# Patient Record
Sex: Female | Born: 1955 | Race: Black or African American | Hispanic: No | Marital: Single | Smoking: Current every day smoker
Health system: Southern US, Community
[De-identification: ages and names within clinical notes are randomized; demographics above are authoritative.]

## PROBLEM LIST (undated history)

## (undated) DIAGNOSIS — F32A Depression, unspecified: Secondary | ICD-10-CM

## (undated) DIAGNOSIS — E119 Type 2 diabetes mellitus without complications: Secondary | ICD-10-CM

## (undated) DIAGNOSIS — K5901 Slow transit constipation: Secondary | ICD-10-CM

## (undated) DIAGNOSIS — T7840XA Allergy, unspecified, initial encounter: Secondary | ICD-10-CM

## (undated) DIAGNOSIS — E1142 Type 2 diabetes mellitus with diabetic polyneuropathy: Secondary | ICD-10-CM

## (undated) DIAGNOSIS — M009 Pyogenic arthritis, unspecified: Secondary | ICD-10-CM

## (undated) DIAGNOSIS — R011 Cardiac murmur, unspecified: Secondary | ICD-10-CM

## (undated) DIAGNOSIS — F329 Major depressive disorder, single episode, unspecified: Secondary | ICD-10-CM

## (undated) DIAGNOSIS — E46 Unspecified protein-calorie malnutrition: Secondary | ICD-10-CM

## (undated) DIAGNOSIS — F419 Anxiety disorder, unspecified: Secondary | ICD-10-CM

## (undated) DIAGNOSIS — K219 Gastro-esophageal reflux disease without esophagitis: Secondary | ICD-10-CM

## (undated) DIAGNOSIS — B1920 Unspecified viral hepatitis C without hepatic coma: Secondary | ICD-10-CM

## (undated) DIAGNOSIS — G629 Polyneuropathy, unspecified: Secondary | ICD-10-CM

## (undated) DIAGNOSIS — D649 Anemia, unspecified: Secondary | ICD-10-CM

## (undated) DIAGNOSIS — N186 End stage renal disease: Secondary | ICD-10-CM

## (undated) DIAGNOSIS — I499 Cardiac arrhythmia, unspecified: Secondary | ICD-10-CM

## (undated) DIAGNOSIS — I5032 Chronic diastolic (congestive) heart failure: Secondary | ICD-10-CM

## (undated) DIAGNOSIS — I10 Essential (primary) hypertension: Secondary | ICD-10-CM

## (undated) DIAGNOSIS — M1990 Unspecified osteoarthritis, unspecified site: Secondary | ICD-10-CM

## (undated) DIAGNOSIS — N184 Chronic kidney disease, stage 4 (severe): Secondary | ICD-10-CM

## (undated) DIAGNOSIS — R2681 Unsteadiness on feet: Secondary | ICD-10-CM

## (undated) DIAGNOSIS — E785 Hyperlipidemia, unspecified: Secondary | ICD-10-CM

## (undated) DIAGNOSIS — M1712 Unilateral primary osteoarthritis, left knee: Secondary | ICD-10-CM

## (undated) DIAGNOSIS — Z9289 Personal history of other medical treatment: Secondary | ICD-10-CM

## (undated) HISTORY — DX: Unspecified viral hepatitis C without hepatic coma: B19.20

## (undated) HISTORY — DX: Unilateral primary osteoarthritis, left knee: M17.12

## (undated) HISTORY — PX: SMALL INTESTINE SURGERY: SHX150

## (undated) HISTORY — PX: JOINT REPLACEMENT: SHX530

## (undated) HISTORY — DX: Unsteadiness on feet: R26.81

## (undated) HISTORY — PX: COLON SURGERY: SHX602

## (undated) HISTORY — DX: Slow transit constipation: K59.01

## (undated) HISTORY — PX: TUBAL LIGATION: SHX77

## (undated) HISTORY — DX: Essential (primary) hypertension: I10

## (undated) HISTORY — PX: CHOLECYSTECTOMY OPEN: SUR202

## (undated) HISTORY — PX: POLYPECTOMY: SHX149

## (undated) HISTORY — DX: Type 2 diabetes mellitus with diabetic polyneuropathy: E11.42

## (undated) HISTORY — DX: Hyperlipidemia, unspecified: E78.5

## (undated) HISTORY — DX: Allergy, unspecified, initial encounter: T78.40XA

## (undated) HISTORY — PX: APPENDECTOMY: SHX54

## (undated) HISTORY — DX: Unspecified protein-calorie malnutrition: E46

## (undated) HISTORY — DX: Chronic diastolic (congestive) heart failure: I50.32

## (undated) HISTORY — PX: COLONOSCOPY: SHX174

---

## 2011-10-18 ENCOUNTER — Inpatient Hospital Stay (HOSPITAL_COMMUNITY)
Admission: EM | Admit: 2011-10-18 | Discharge: 2011-10-19 | DRG: 313 | Disposition: A | Discharge status: Home / Self Care | Payer: Self-pay | Attending: Cardiovascular Disease | Admitting: Cardiovascular Disease

## 2011-10-18 ENCOUNTER — Emergency Department (HOSPITAL_COMMUNITY): Payer: Self-pay

## 2011-10-18 ENCOUNTER — Encounter (HOSPITAL_COMMUNITY): Payer: Self-pay | Admitting: Emergency Medicine

## 2011-10-18 DIAGNOSIS — R0602 Shortness of breath: Secondary | ICD-10-CM | POA: Diagnosis present

## 2011-10-18 DIAGNOSIS — R0789 Other chest pain: Principal | ICD-10-CM | POA: Diagnosis present

## 2011-10-18 DIAGNOSIS — Z23 Encounter for immunization: Secondary | ICD-10-CM

## 2011-10-18 DIAGNOSIS — R079 Chest pain, unspecified: Secondary | ICD-10-CM | POA: Diagnosis present

## 2011-10-18 DIAGNOSIS — Z79899 Other long term (current) drug therapy: Secondary | ICD-10-CM

## 2011-10-18 DIAGNOSIS — E119 Type 2 diabetes mellitus without complications: Secondary | ICD-10-CM | POA: Diagnosis present

## 2011-10-18 DIAGNOSIS — I10 Essential (primary) hypertension: Secondary | ICD-10-CM | POA: Diagnosis present

## 2011-10-18 DIAGNOSIS — F17200 Nicotine dependence, unspecified, uncomplicated: Secondary | ICD-10-CM | POA: Diagnosis present

## 2011-10-18 HISTORY — DX: Essential (primary) hypertension: I10

## 2011-10-18 LAB — URINALYSIS, ROUTINE W REFLEX MICROSCOPIC
Bilirubin Urine: NEGATIVE
Glucose, UA: 500 mg/dL — AB
Hgb urine dipstick: NEGATIVE
Leukocytes, UA: NEGATIVE
Nitrite: NEGATIVE
Protein, ur: >300 mg/dL — AB
Specific Gravity, Urine: 1.028 (ref 1.005–1.030)
Urobilinogen, UA: 1 mg/dL (ref 0.0–1.0)
pH: 6 (ref 5.0–8.0)

## 2011-10-18 LAB — COMPREHENSIVE METABOLIC PANEL
ALT: 74 U/L — ABNORMAL HIGH (ref 0–35)
AST: 27 U/L (ref 0–37)
Albumin: 3.7 g/dL (ref 3.5–5.2)
Alkaline Phosphatase: 110 U/L (ref 39–117)
BUN: 32 mg/dL — ABNORMAL HIGH (ref 6–23)
CO2: 28 mEq/L (ref 19–32)
Calcium: 9.7 mg/dL (ref 8.4–10.5)
Chloride: 93 mEq/L — ABNORMAL LOW (ref 96–112)
Creatinine, Ser: 1.57 mg/dL — ABNORMAL HIGH (ref 0.50–1.10)
GFR calc Af Amer: 41 mL/min — ABNORMAL LOW (ref >90)
GFR calc non Af Amer: 36 mL/min — ABNORMAL LOW (ref >90)
Glucose, Bld: 348 mg/dL — ABNORMAL HIGH (ref 70–99)
Potassium: 3.8 mEq/L (ref 3.5–5.1)
Sodium: 132 mEq/L — ABNORMAL LOW (ref 135–145)
Total Bilirubin: 0.3 mg/dL (ref 0.3–1.2)
Total Protein: 8.6 g/dL — ABNORMAL HIGH (ref 6.0–8.3)

## 2011-10-18 LAB — GLUCOSE, CAPILLARY: Glucose-Capillary: 387 mg/dL — ABNORMAL HIGH (ref 70–99)

## 2011-10-18 LAB — CBC
HCT: 44.4 % (ref 36.0–46.0)
Hemoglobin: 16.2 g/dL — ABNORMAL HIGH (ref 12.0–15.0)
MCH: 33.1 pg (ref 26.0–34.0)
MCHC: 36.5 g/dL — ABNORMAL HIGH (ref 30.0–36.0)
MCV: 90.8 fL (ref 78.0–100.0)
Platelets: 196 10*3/uL (ref 150–400)
RBC: 4.89 MIL/uL (ref 3.87–5.11)
RDW: 12.6 % (ref 11.5–15.5)
WBC: 6.4 10*3/uL (ref 4.0–10.5)

## 2011-10-18 LAB — DIFFERENTIAL
Basophils Absolute: 0 10*3/uL (ref 0.0–0.1)
Basophils Relative: 0 % (ref 0–1)
Eosinophils Absolute: 0 10*3/uL (ref 0.0–0.7)
Eosinophils Relative: 1 % (ref 0–5)
Lymphocytes Relative: 44 % (ref 12–46)
Lymphs Abs: 2.8 10*3/uL (ref 0.7–4.0)
Monocytes Absolute: 0.4 10*3/uL (ref 0.1–1.0)
Monocytes Relative: 7 % (ref 3–12)
Neutro Abs: 3.2 10*3/uL (ref 1.7–7.7)
Neutrophils Relative %: 49 % (ref 43–77)

## 2011-10-18 LAB — URINE MICROSCOPIC-ADD ON

## 2011-10-18 LAB — POCT I-STAT TROPONIN I: Troponin i, poc: 0.01 ng/mL (ref 0.00–0.08)

## 2011-10-18 MED ORDER — ATENOLOL 50 MG PO TABS
50.0000 mg | ORAL_TABLET | Freq: Every day | ORAL | Status: DC
  Administered 2011-10-19: 50 mg via ORAL
  Filled 2011-10-18: qty 1

## 2011-10-18 MED ORDER — SODIUM CHLORIDE 0.9 % IV SOLN: INTRAVENOUS | Status: DC

## 2011-10-18 MED ORDER — CLOPIDOGREL BISULFATE 75 MG PO TABS
75.0000 mg | ORAL_TABLET | Freq: Every day | ORAL | Status: DC
  Administered 2011-10-19: 75 mg via ORAL
  Filled 2011-10-18: qty 1

## 2011-10-18 MED ORDER — NITROGLYCERIN 0.4 MG SL SUBL: 0.4000 mg | SUBLINGUAL_TABLET | SUBLINGUAL | Status: DC | PRN

## 2011-10-18 MED ORDER — SIMVASTATIN 20 MG PO TABS: 20.0000 mg | ORAL_TABLET | Freq: Every day | ORAL | Status: DC

## 2011-10-18 MED ORDER — ASPIRIN 81 MG PO CHEW: 324.0000 mg | CHEWABLE_TABLET | ORAL | Status: DC

## 2011-10-18 MED ORDER — HEPARIN BOLUS VIA INFUSION
4000.0000 [IU] | Freq: Once | INTRAVENOUS | Status: AC
  Administered 2011-10-18: 4000 [IU] via INTRAVENOUS
  Filled 2011-10-18: qty 4000

## 2011-10-18 MED ORDER — SODIUM CHLORIDE 0.9 % IV BOLUS (SEPSIS)
1000.0000 mL | Freq: Once | INTRAVENOUS | Status: AC
  Administered 2011-10-18: 1000 mL via INTRAVENOUS

## 2011-10-18 MED ORDER — HYDROCHLOROTHIAZIDE 25 MG PO TABS
25.0000 mg | ORAL_TABLET | Freq: Every day | ORAL | Status: DC
Start: 2011-10-19 — End: 2011-10-19
  Administered 2011-10-19: 25 mg via ORAL
  Filled 2011-10-18: qty 1

## 2011-10-18 MED ORDER — SODIUM CHLORIDE 0.9 % IV SOLN
INTRAVENOUS | Status: DC
  Administered 2011-10-18: 23:00:00 via INTRAVENOUS

## 2011-10-18 MED ORDER — ACETAMINOPHEN 325 MG PO TABS: 650.0000 mg | ORAL_TABLET | ORAL | Status: DC | PRN

## 2011-10-18 MED ORDER — GLIPIZIDE 5 MG PO TABS
5.0000 mg | ORAL_TABLET | Freq: Two times a day (BID) | ORAL | Status: DC
  Administered 2011-10-19: 5 mg via ORAL
  Filled 2011-10-18 (×3): qty 1

## 2011-10-18 MED ORDER — ASPIRIN EC 81 MG PO TBEC: 81.0000 mg | DELAYED_RELEASE_TABLET | Freq: Every day | ORAL | Status: DC

## 2011-10-18 MED ORDER — SODIUM CHLORIDE 0.9 % IV SOLN: Freq: Once | INTRAVENOUS | Status: DC

## 2011-10-18 MED ORDER — IOHEXOL 300 MG/ML  SOLN
80.0000 mL | Freq: Once | INTRAMUSCULAR | Status: AC | PRN
  Administered 2011-10-18: 80 mL via INTRAVENOUS

## 2011-10-18 MED ORDER — INSULIN ASPART 100 UNIT/ML ~~LOC~~ SOLN: 0.0000 [IU] | Freq: Every day | SUBCUTANEOUS | Status: DC

## 2011-10-18 MED ORDER — ASPIRIN EC 81 MG PO TBEC
81.0000 mg | DELAYED_RELEASE_TABLET | Freq: Every day | ORAL | Status: DC
  Administered 2011-10-19: 81 mg via ORAL
  Filled 2011-10-18: qty 1

## 2011-10-18 MED ORDER — ONDANSETRON HCL 4 MG/2ML IJ SOLN: 4.0000 mg | Freq: Four times a day (QID) | INTRAMUSCULAR | Status: DC | PRN

## 2011-10-18 MED ORDER — ASPIRIN 300 MG RE SUPP
300.0000 mg | RECTAL | Status: AC
  Filled 2011-10-18: qty 1

## 2011-10-18 MED ORDER — IRBESARTAN 150 MG PO TABS
150.0000 mg | ORAL_TABLET | Freq: Every day | ORAL | Status: DC
  Administered 2011-10-19: 150 mg via ORAL
  Filled 2011-10-18: qty 1

## 2011-10-18 MED ORDER — DIPHENHYDRAMINE HCL 50 MG/ML IJ SOLN
25.0000 mg | Freq: Once | INTRAMUSCULAR | Status: AC
  Administered 2011-10-18: 25 mg via INTRAVENOUS
  Filled 2011-10-18: qty 1

## 2011-10-18 MED ORDER — ACETAMINOPHEN 325 MG PO TABS
650.0000 mg | ORAL_TABLET | ORAL | Status: DC | PRN
  Administered 2011-10-18: 650 mg via ORAL

## 2011-10-18 MED ORDER — SIMVASTATIN 20 MG PO TABS
20.0000 mg | ORAL_TABLET | Freq: Every day | ORAL | Status: DC
  Administered 2011-10-19: 20 mg via ORAL
  Filled 2011-10-18: qty 1

## 2011-10-18 MED ORDER — INSULIN ASPART 100 UNIT/ML ~~LOC~~ SOLN
0.0000 [IU] | Freq: Three times a day (TID) | SUBCUTANEOUS | Status: DC
  Administered 2011-10-19: 8 [IU] via SUBCUTANEOUS
  Administered 2011-10-19: 3 [IU] via SUBCUTANEOUS

## 2011-10-18 MED ORDER — HEPARIN (PORCINE) IN NACL 100-0.45 UNIT/ML-% IJ SOLN
950.0000 [IU]/h | INTRAMUSCULAR | Status: DC
  Administered 2011-10-18: 950 [IU]/h via INTRAVENOUS
  Filled 2011-10-18 (×2): qty 250

## 2011-10-18 MED ORDER — GABAPENTIN 300 MG PO CAPS
300.0000 mg | ORAL_CAPSULE | Freq: Three times a day (TID) | ORAL | Status: DC
  Administered 2011-10-18 – 2011-10-19 (×3): 300 mg via ORAL
  Filled 2011-10-18 (×4): qty 1

## 2011-10-18 MED ORDER — ASPIRIN 300 MG RE SUPP: 300.0000 mg | RECTAL | Status: DC

## 2011-10-18 MED ORDER — METOPROLOL TARTRATE 25 MG PO TABS: 50.0000 mg | ORAL_TABLET | Freq: Two times a day (BID) | ORAL | Status: DC

## 2011-10-18 MED ORDER — ASPIRIN 81 MG PO CHEW
324.0000 mg | CHEWABLE_TABLET | ORAL | Status: AC
  Administered 2011-10-18: 324 mg via ORAL
  Filled 2011-10-18: qty 4

## 2011-10-18 MED ORDER — ACETAMINOPHEN 325 MG PO TABS
ORAL_TABLET | ORAL | Status: AC
  Filled 2011-10-18: qty 2

## 2011-10-18 MED ORDER — CLOPIDOGREL BISULFATE 75 MG PO TABS
75.0000 mg | ORAL_TABLET | Freq: Every day | ORAL | Status: DC
  Filled 2011-10-18: qty 1

## 2011-10-18 NOTE — ED Provider Notes (Signed)
History     CSN: [id number]  Arrival date & time 10/18/11  75   First MD Initiated Contact with Patient 10/18/11 1626      Chief Complaint  Patient presents with  . Shortness of Breath    (Consider location/radiation/quality/duration/timing/severity/associated sxs/prior treatment) Patient is a 56 y.o. female presenting with shortness of breath. The history is provided by the patient.  Shortness of Breath  Associated symptoms include shortness of breath.   patient here with shortness of breath and pleuritic chest pain x2 days. Notes leg pain and swelling. Denies any diaphoresis with this. No fever or productive cough. Chest pain is sharp and last for seconds and radiates to her left arm, but she says this is been along standing problem for her. Seen by her doctor 3 days ago for similar symptoms but that diagnosis. No nausea vomiting. No medications taken prior to arrival  Past Medical History  Diagnosis Date  . Diabetes mellitus   . Hypertension     Past Surgical History  Procedure Date  . Cholecystectomy   . Appendectomy   . Small intestine surgery   . Tubal ligation     No family history on file.  History  Substance Use Topics  . Smoking status: Current Everyday Smoker -- 0.5 packs/day  . Smokeless tobacco: Not on file  . Alcohol Use: Yes     ocassionally    OB History    Grav Para Term Preterm Abortions TAB SAB Ect Mult Living                  Review of Systems  Respiratory: Positive for shortness of breath.   All other systems reviewed and are negative.    Allergies  Review of patient's allergies indicates no known allergies.  Home Medications   Current Outpatient Rx  Name Route Sig Dispense Refill  . DIPHENHYDRAMINE HCL 25 MG PO TABS Oral Take 50 mg by mouth every 6 (six) hours as needed.    [name] GABAPENTIN 300 MG PO CAPS Oral Take 300 mg by mouth at bedtime.    [name] GLIPIZIDE 5 MG PO TABS Oral Take 5 mg by mouth 2 (two) times daily before a meal.    .  ADULT MULTIVITAMIN W/MINERALS CH Oral Take 1 tablet by mouth daily.    [name] NAPROXEN SODIUM 220 MG PO TABS Oral Take 440 mg by mouth 2 (two) times daily with a meal.    . OLMESARTAN MEDOXOMIL-HCTZ 40-25 MG PO TABS Oral Take 1 tablet by mouth daily.    . ATENOLOL 50 MG PO TABS Oral Take 50 mg by mouth 2 (two) times daily.      BP 152/89  Pulse 119  Temp(Src) 98.9 F (37.2 C) (Oral)  Resp 25  SpO2 100%  Physical Exam  Nursing note and vitals reviewed. Constitutional: She is oriented to person, place, and time. She appears well-developed and well-nourished.  Non-toxic appearance. No distress.  HENT:  Head: Normocephalic and atraumatic.  Eyes: Conjunctivae, EOM and lids are normal. Pupils are equal, round, and reactive to light.  Neck: Normal range of motion. Neck supple. No tracheal deviation present. No mass present.  Cardiovascular: Regular rhythm and normal heart sounds.  Tachycardia present.  Exam reveals no gallop.   No murmur heard. Pulmonary/Chest: Effort normal and breath sounds normal. No stridor. No respiratory distress. She has no decreased breath sounds. She has no wheezes. She has no rhonchi. She has no rales.  Abdominal: Soft. Normal appearance and bowel  sounds are normal. She exhibits no distension. There is no tenderness. There is no rebound and no CVA tenderness.  Musculoskeletal: Normal range of motion. She exhibits no edema and no tenderness.  Neurological: She is alert and oriented to person, place, and time. She has normal strength. No cranial nerve deficit or sensory deficit. GCS eye subscore is 4. GCS verbal subscore is 5. GCS motor subscore is 6.  Skin: Skin is warm and dry. No abrasion and no rash noted.  Psychiatric: She has a normal mood and affect. Her speech is normal and behavior is normal.    ED Course  Procedures (including critical care time)  Labs Reviewed  GLUCOSE, CAPILLARY - Abnormal; Notable for the following:    Glucose-Capillary 387 (*)    All  other components within normal limits  CBC  DIFFERENTIAL  COMPREHENSIVE METABOLIC PANEL  URINALYSIS, ROUTINE W REFLEX MICROSCOPIC  URINE CULTURE   No results found.   No diagnosis found.    MDM   Date: 10/18/2011  Rate: 115  Rhythm: sinus tachycardia  QRS Axis: normal  Intervals: normal  ST/T Wave abnormalities: nonspecific ST changes  Conduction Disutrbances:none  Narrative Interpretation:   Old EKG Reviewed: unchanged  While patient was then monitored here in the department she did have a 6-8 beat run of ventricular tachycardia. She had aspirin prior to arrival. Given her history of multiple cardiac risk factors have spoken with Dr. Terrence Dupont and he will come to admit          Leota Jacobsen, MD 10/18/11 [phone]

## 2011-10-18 NOTE — H&P (Signed)
Brandy Houston is an 56 y.o. female.   Chief Complaint: Chest pain HPI: 57 years old black female with DM, II and hypertension has substernal sharp chest pain radiating to left arm x 2 days. She also has shortness of breath.  Past Medical History  Diagnosis Date  . Diabetes mellitus   . Hypertension       Past Surgical History  Procedure Date  . Cholecystectomy   . Appendectomy   . Small intestine surgery   . Tubal ligation     No family history on file. Social History:  reports that she has been smoking.  She does not have any smokeless tobacco history on file. She reports that she drinks alcohol. She reports that she does not use illicit drugs.  Allergies:  Allergies  Allergen Reactions  . Omnipaque (Iohexol) Hives     (Not in a hospital admission)  Results for orders placed during the hospital encounter of 10/18/11 (from the past 48 hour(s))  GLUCOSE, CAPILLARY     Status: Abnormal   Collection Time   10/18/11  3:55 PM      Component Value Range Comment   Glucose-Capillary 387 (*) 70 - 99 (mg/dL)   URINALYSIS, ROUTINE W REFLEX MICROSCOPIC     Status: Abnormal   Collection Time   10/18/11  4:50 PM      Component Value Range Comment   Color, Urine AMBER (*) YELLOW  BIOCHEMICALS MAY BE AFFECTED BY COLOR   APPearance CLOUDY (*) CLEAR     Specific Gravity, Urine 1.028  1.005 - 1.030     pH 6.0  5.0 - 8.0     Glucose, UA 500 (*) NEGATIVE (mg/dL)    Hgb urine dipstick NEGATIVE  NEGATIVE     Bilirubin Urine NEGATIVE  NEGATIVE     Ketones, ur TRACE (*) NEGATIVE (mg/dL)    Protein, ur >300 (*) NEGATIVE (mg/dL)    Urobilinogen, UA 1.0  0.0 - 1.0 (mg/dL)    Nitrite NEGATIVE  NEGATIVE     Leukocytes, UA NEGATIVE  NEGATIVE    URINE MICROSCOPIC-ADD ON     Status: Abnormal   Collection Time   10/18/11  4:50 PM      Component Value Range Comment   Squamous Epithelial / LPF FEW (*) RARE     WBC, UA 0-2  <3 (WBC/hpf)    RBC / HPF 0-2  <3 (RBC/hpf)    Bacteria, UA RARE  RARE     Casts HYALINE CASTS (*) NEGATIVE  GRANULAR CAST  CBC     Status: Abnormal   Collection Time   10/18/11  5:00 PM      Component Value Range Comment   WBC 6.4  4.0 - 10.5 (K/uL)    RBC 4.89  3.87 - 5.11 (MIL/uL)    Hemoglobin 16.2 (*) 12.0 - 15.0 (g/dL)    HCT 44.4  36.0 - 46.0 (%)    MCV 90.8  78.0 - 100.0 (fL)    MCH 33.1  26.0 - 34.0 (pg)    MCHC 36.5 (*) 30.0 - 36.0 (g/dL)    RDW 12.6  11.5 - 15.5 (%)    Platelets 196  150 - 400 (K/uL)   DIFFERENTIAL     Status: Normal   Collection Time   10/18/11  5:00 PM      Component Value Range Comment   Neutrophils Relative 49  43 - 77 (%)    Neutro Abs 3.2  1.7 - 7.7 (K/uL)  Lymphocytes Relative 44  12 - 46 (%)    Lymphs Abs 2.8  0.7 - 4.0 (K/uL)    Monocytes Relative 7  3 - 12 (%)    Monocytes Absolute 0.4  0.1 - 1.0 (K/uL)    Eosinophils Relative 1  0 - 5 (%)    Eosinophils Absolute 0.0  0.0 - 0.7 (K/uL)    Basophils Relative 0  0 - 1 (%)    Basophils Absolute 0.0  0.0 - 0.1 (K/uL)   COMPREHENSIVE METABOLIC PANEL     Status: Abnormal   Collection Time   10/18/11  5:00 PM      Component Value Range Comment   Sodium 132 (*) 135 - 145 (mEq/L)    Potassium 3.8  3.5 - 5.1 (mEq/L)    Chloride 93 (*) 96 - 112 (mEq/L)    CO2 28  19 - 32 (mEq/L)    Glucose, Bld 348 (*) 70 - 99 (mg/dL)    BUN 32 (*) 6 - 23 (mg/dL)    Creatinine, Ser 1.57 (*) 0.50 - 1.10 (mg/dL)    Calcium 9.7  8.4 - 10.5 (mg/dL)    Total Protein 8.6 (*) 6.0 - 8.3 (g/dL)    Albumin 3.7  3.5 - 5.2 (g/dL)    AST 27  0 - 37 (U/L)    ALT 74 (*) 0 - 35 (U/L)    Alkaline Phosphatase 110  39 - 117 (U/L)    Total Bilirubin 0.3  0.3 - 1.2 (mg/dL)    GFR calc non Af Amer 36 (*) >90 (mL/min)    GFR calc Af Amer 41 (*) >90 (mL/min)   POCT I-STAT TROPONIN I     Status: Normal   Collection Time   10/18/11  5:11 PM      Component Value Range Comment   Troponin i, poc 0.01  0.00 - 0.08 (ng/mL)    Comment 3             Ct Angio Chest W/cm &/or Wo Cm  10/18/2011  *RADIOLOGY REPORT*   Clinical Data: Shortness of breath.  Left sided pleuritic chest pain.  CT ANGIOGRAPHY CHEST 10/18/2011::  Technique:  Multidetector CT imaging of the chest using the standard protocol during bolus administration of intravenous contrast. Multiplanar reconstructed images including MIPs were obtained and reviewed to evaluate the vascular anatomy.  Contrast: 81mL OMNIPAQUE IOHEXOL 300 MG/ML.  Comparison: None.  Findings: Contrast opacification of the pulmonary arteries is very good.  Respiratory motion blurs images of the lung bases; overall, the study is of good diagnostic quality.  No filling defects within either main pulmonary artery or their branches in either lung to suggest pulmonary embolism.  Heart size upper normal with mild left ventricular hypertrophy.  Moderate left circumflex coronary artery calcification and mild LAD coronary calcification.  No pericardial effusion.  Mild to moderate atherosclerosis involving the thoracic aorta without aneurysm or dissection.  Pulmonary parenchyma clear without localized airspace consolidation, interstitial disease, or parenchymal nodules or masses.  Central airways patent without significant bronchial wall thickening.  No significant mediastinal, hilar, or axillary lymphadenopathy. Visualized thyroid gland unremarkable.  Visualized extreme upper abdomen unremarkable.  Bone window images demonstrate mid and lower thoracic spondylosis and exaggeration of the usual thoracic kyphosis.  IMPRESSION:  1.  No evidence of pulmonary embolism. 2.  No acute cardiopulmonary disease. 3.  Moderate left circumflex coronary calcification and mild LAD coronary calcification.  Original Report Authenticated By: Deniece Portela, M.D.    @ROS @  Blood pressure 152/89, pulse 119, temperature 98.9 F (37.2 C), temperature source Oral, resp. rate 25, SpO2 100.00%.  Constitutional: She is oriented to person, place, and time. She appears well-developed and well-nourished. No distress.    HENT: Head: Normocephalic and atraumatic. Eyes: Brown, Conjunctivae, EOM and lids are normal. Pupils are equal, round, and reactive to light.  Neck: Normal range of motion. Neck supple. No tracheal deviation present. No mass present.  Cardiovascular: Regular rhythm and normal heart sounds. Tachycardia present. Exam reveals no gallop.  No murmur heard.  Pulmonary/Chest: Effort normal and breath sounds normal. No stridor. No respiratory distress. She has no decreased breath sounds. She has no wheezes. She has no rhonchi. She has no rales.  Abdominal: Soft. Normal appearance and bowel sounds are normal. She exhibits no distension. There is no tenderness. There is no rebound and no CVA tenderness.  Musculoskeletal: Normal range of motion. She exhibits no edema and no tenderness.  Neurological: She is alert and oriented to person, place, and time. She has normal strength. No cranial nerve deficit or sensory deficit.   Skin: Skin is warm and dry. No abrasion and no rash noted.  Psychiatric: She has a normal mood and affect. Her speech is normal and behavior is normal.   Assessment/Plan Chest pain  R/O MI DM, II Hypertension Dehydration Renal insufficiency  Admit/Nuclear stress test/IV fluids/Home meds  Rollo Farquhar S 10/18/2011, 8:09 PM

## 2011-10-18 NOTE — Progress Notes (Signed)
[place] Initial Consult Note  Pharmacy Consult for: Heparin  Indication: chest pain/ACS    Patient Data:   Allergies: Allergies  Allergen Reactions  . Omnipaque (Iohexol) Hives    Patient Measurements: Height: 5\' 7"  (170.2 cm) Weight: 173 lb 15.1 oz (78.9 kg) IBW/kg (Calculated) : 61.6   Vital Signs: Temp:  [98.2 F (36.8 C)-98.9 F (37.2 C)] 98.2 F (36.8 C) (05/04 2246) Pulse Rate:  [96-119] 96  (05/04 2246) Resp:  [18-25] 18  (05/04 2246) BP: (152-163)/(89-98) 158/98 mmHg (05/04 2246) SpO2:  [100 %] 100 % (05/04 2246) Weight:  [173 lb 15.1 oz (78.9 kg)] 173 lb 15.1 oz (78.9 kg) (05/04 2246)  Intake/Output from previous day: No intake or output data in the 24 hours ending 10/18/11 2249  Labs:  Basename 10/18/11 1700  HGB 16.2*  HCT 44.4  PLT 196  APTT --  LABPROT --  INR --  HEPARINUNFRC --  CREATININE 1.57*  CKTOTAL --  CKMB --  TROPONINI --   Estimated Creatinine Clearance: 42.8 ml/min (by C-G formula based on Cr of 1.57).  Medical History: Past Medical History  Diagnosis Date  . Diabetes mellitus   . Hypertension     Scheduled medications:     . aspirin  324 mg Oral NOW   Or  . aspirin  300 mg Rectal NOW  . aspirin EC  81 mg Oral Daily  . atenolol  50 mg Oral Daily  . clopidogrel  75 mg Oral Q breakfast  . diphenhydrAMINE  25 mg Intravenous Once  . gabapentin  300 mg Oral TID  . glipiZIDE  5 mg Oral BID AC  . hydrochlorothiazide  25 mg Oral Daily  . insulin aspart  0-15 Units Subcutaneous TID WC  . insulin aspart  0-5 Units Subcutaneous QHS  . irbesartan  150 mg Oral Daily  . simvastatin  20 mg Oral q1800  . sodium chloride  1,000 mL Intravenous Once  . sodium chloride  1,000 mL Intravenous Once  . DISCONTD: sodium chloride   Intravenous Once  . DISCONTD: aspirin  324 mg Oral NOW  . DISCONTD: aspirin EC  81 mg Oral Daily  . DISCONTD: aspirin  300 mg Rectal NOW  . DISCONTD: clopidogrel  75 mg Oral Q  breakfast  . DISCONTD: metoprolol tartrate  50 mg Oral BID  . DISCONTD: simvastatin  20 mg Oral q1800     Assessment:  56 y.o. female admitted on 10/18/2011, with r/o ACS. Pharmacy consulted to manage IV heparin. Baseline Hgb 16.2, plt 196. No baseline INR, but no anticoagulant on med history.   Goal of Therapy:  1. Heparin level 0.3-0.7 units/ml  Plan:  1. Heparin 4000 units IV x 1, then 950 units/hr.  2. Daily CBC, heparin level.  3. Baseline PT / INR.  Monia Sabal Doy Mince, PharmD 10/18/2011, 10:49 PM

## 2011-10-18 NOTE — ED Notes (Signed)
Pt presenting to ed with c/o shortness of breath and pt states she had chest pain last night but denies chest pain at this time. Pt denies nausea and vomiting at this time. Pt states she does have pain in both sides of her jaw with a headache. Pt states she was recently seen by pcp on Thursday. Pt is alert and oriented at this time

## 2011-10-19 ENCOUNTER — Inpatient Hospital Stay (HOSPITAL_COMMUNITY): Payer: Self-pay

## 2011-10-19 ENCOUNTER — Encounter (HOSPITAL_COMMUNITY): Payer: Self-pay | Admitting: *Deleted

## 2011-10-19 LAB — BASIC METABOLIC PANEL
BUN: 23 mg/dL (ref 6–23)
CO2: 26 mEq/L (ref 19–32)
Calcium: 9.2 mg/dL (ref 8.4–10.5)
Chloride: 97 mEq/L (ref 96–112)
Creatinine, Ser: 0.87 mg/dL (ref 0.50–1.10)
GFR calc Af Amer: 84 mL/min — ABNORMAL LOW (ref >90)
GFR calc non Af Amer: 73 mL/min — ABNORMAL LOW (ref >90)
Glucose, Bld: 201 mg/dL — ABNORMAL HIGH (ref 70–99)
Potassium: 3.4 mEq/L — ABNORMAL LOW (ref 3.5–5.1)
Sodium: 134 mEq/L — ABNORMAL LOW (ref 135–145)

## 2011-10-19 LAB — CARDIAC PANEL(CRET KIN+CKTOT+MB+TROPI)
CK, MB: 3.5 ng/mL (ref 0.3–4.0)
Relative Index: 2.4 (ref 0.0–2.5)
Total CK: 147 U/L (ref 7–177)
Troponin I: <0.3 ng/mL (ref <0.30)

## 2011-10-19 LAB — GLUCOSE, CAPILLARY
Glucose-Capillary: 230 mg/dL — ABNORMAL HIGH (ref 70–99)
Glucose-Capillary: 298 mg/dL — ABNORMAL HIGH (ref 70–99)
Glucose-Capillary: 181 mg/dL — ABNORMAL HIGH (ref 70–99)

## 2011-10-19 LAB — HEPARIN LEVEL (UNFRACTIONATED): Heparin Unfractionated: 0.46 IU/mL (ref 0.30–0.70)

## 2011-10-19 LAB — PROTIME-INR
INR: 0.93 (ref 0.00–1.49)
Prothrombin Time: 12.7 seconds (ref 11.6–15.2)

## 2011-10-19 LAB — URINE CULTURE
Colony Count: 85000
Culture  Setup Time: 201305042016

## 2011-10-19 LAB — LIPID PANEL
Cholesterol: 153 mg/dL (ref 0–200)
HDL: 69 mg/dL (ref >39)
LDL Cholesterol: 64 mg/dL (ref 0–99)
Total CHOL/HDL Ratio: 2.2 RATIO
Triglycerides: 99 mg/dL (ref <150)
VLDL: 20 mg/dL (ref 0–40)

## 2011-10-19 MED ORDER — TECHNETIUM TC 99M TETROFOSMIN IV KIT
10.0000 | PACK | Freq: Once | INTRAVENOUS | Status: AC | PRN
  Administered 2011-10-19: 10 via INTRAVENOUS

## 2011-10-19 MED ORDER — POTASSIUM CHLORIDE ER 10 MEQ PO TBCR: 10.0000 meq | EXTENDED_RELEASE_TABLET | Freq: Every morning | ORAL | Status: DC

## 2011-10-19 MED ORDER — SIMVASTATIN 20 MG PO TABS: 20.0000 mg | ORAL_TABLET | Freq: Every day | ORAL | Status: DC

## 2011-10-19 MED ORDER — TECHNETIUM TC 99M TETROFOSMIN IV KIT
30.0000 | PACK | Freq: Once | INTRAVENOUS | Status: AC | PRN
  Administered 2011-10-19: 30 via INTRAVENOUS

## 2011-10-19 MED ORDER — REGADENOSON 0.4 MG/5ML IV SOLN
0.4000 mg | Freq: Once | INTRAVENOUS | Status: AC
  Administered 2011-10-19: 0.4 mg via INTRAVENOUS
  Filled 2011-10-19: qty 5

## 2011-10-19 NOTE — Progress Notes (Signed)
[place] for: Heparin  Indication: chest pain/ACS    Patient Data:   Allergies: Allergies  Allergen Reactions  . Omnipaque (Iohexol) Hives    Patient Measurements: Height: 5\' 7"  (170.2 cm) Weight: 172 lb 6.4 oz (78.2 kg) IBW/kg (Calculated) : 61.6   Labs:  Basename 10/19/11 0430 10/18/11 1700  HGB -- 16.2*  HCT -- 44.4  PLT -- 196  APTT -- --  LABPROT 12.7 --  INR 0.93 --  HEPARINUNFRC 0.46 --  CREATININE -- 1.57*  CKTOTAL 147 --  CKMB 3.5 --  TROPONINI <0.30 --   Estimated Creatinine Clearance: 42.6 ml/min (by C-G formula based on Cr of 1.57).   Assessment:  56 y.o. female  with ACS for Heparin  Goal of Therapy:  1. Heparin level 0.3-0.7 units/ml  Plan:  Continue Heparin at current rate Recheck level with next cardiac panel to verify.  Phillis Knack, PharmD, BCPS

## 2011-10-19 NOTE — Discharge Summary (Signed)
Physician Discharge Summary  [name] MRN: [id number] DOB/AGE: [birth year] 56 y.o.  Admit date: 10/18/2011 Discharge date: 10/19/2011  Admission Diagnoses: Chest pain Shortness of breath DM, II Hypertension  Discharge Diagnoses:  Principal Problem:  *Chest pain at rest Active Problems:  Shortness of breath DM, II Hypertension  Discharged Condition: good  Hospital Course: 56 years old black female had recurrent chest pain x 2 days. Her cardiac enzymes were normal. She underwent nuclear stress test that showed mild global hypokinesia with EF-50 %. With no reversible ischemia she was discharged home in stable condition. Here renal function inproved with IV hydration and K+ supplement was given for K+ level of 3.4 meq.  Consults: cardiology  Significant Diagnostic Studies: labs: Near normal CBC, electrolytes. Creatinine normalized post hydration. and nuclear medicine: EF 50 % and no reversible ischemia   Treatments: IV hydration and cardiac meds: atenolol  Discharge Exam: Blood pressure 135/71, pulse 86, temperature 98.7 F (37.1 C), temperature source Oral, resp. rate 22, height 5\' 7"  (1.702 m), weight 78.2 kg (172 lb 6.4 oz), last menstrual period 03/16/2002, SpO2 100.00%. Constitutional: She is oriented to person, place, and time. She appears well-developed and well-nourished. No distress.  HENT: Head: Normocephalic and atraumatic. Eyes: Brown, Conjunctivae, EOM and lids are normal. Pupils are equal, round, and reactive to light.  Neck: Normal range of motion. Neck supple. No tracheal deviation present. No mass present.  Cardiovascular: Regular rhythm and normal heart sounds. Tachycardia present. Exam reveals no gallop.  No murmur heard.  Pulmonary/Chest: Effort normal and breath sounds normal. No stridor. No respiratory distress. She has no decreased breath sounds. She has no wheezes. She has no rhonchi. She has no rales.  Abdominal: Soft. Normal appearance and bowel  sounds are normal. She exhibits no distension. There is no tenderness. There is no rebound and no CVA tenderness.  Musculoskeletal: Normal range of motion. She exhibits no edema and no tenderness.  Neurological: She is alert and oriented to person, place, and time. She has normal strength. No cranial nerve deficit or sensory deficit.  Skin: Skin is warm and dry. No abrasion and no rash noted.  Psychiatric: She has a normal mood and affect. Her speech is normal and behavior is normal   Disposition: Final discharge disposition not confirmed   Medication List  As of 10/19/2011  6:37 PM   STOP taking these medications         naproxen sodium 220 MG tablet         TAKE these medications         atenolol 50 MG tablet   Commonly known as: TENORMIN   Take 50 mg by mouth 2 (two) times daily.      diphenhydrAMINE 25 MG tablet   Commonly known as: BENADRYL   Take 50 mg by mouth every 6 (six) hours as needed.      gabapentin 300 MG capsule   Commonly known as: NEURONTIN   Take 300 mg by mouth at bedtime.      glipiZIDE 5 MG tablet   Commonly known as: GLUCOTROL   Take 5 mg by mouth 2 (two) times daily before a meal.      mulitivitamin with minerals Tabs   Take 1 tablet by mouth daily.      olmesartan-hydrochlorothiazide 40-25 MG per tablet   Commonly known as: BENICAR HCT   Take 1 tablet by mouth daily.      potassium chloride 10 MEQ tablet   Commonly known  as: K-DUR   Take 1 tablet (10 mEq total) by mouth every morning.      simvastatin 20 MG tablet   Commonly known as: ZOCOR   Take 1 tablet (20 mg total) by mouth daily at 6 PM.           Follow-up Information    Follow up with Brandy Demark, MD. Schedule an appointment as soon as possible for a visit in 2 weeks.   Contact information:   [address] [place] [phone]          Signed: Birdie Houston 10/19/2011, 6:37 PM

## 2011-10-20 LAB — URINE CULTURE
Colony Count: 50000
Culture  Setup Time: 201305051052

## 2011-10-20 LAB — GLUCOSE, CAPILLARY: Glucose-Capillary: 297 mg/dL — ABNORMAL HIGH (ref 70–99)

## 2011-10-22 NOTE — Progress Notes (Signed)
Retro-Utilization review completed  

## 2012-11-30 ENCOUNTER — Encounter (HOSPITAL_COMMUNITY): Payer: Self-pay | Admitting: Emergency Medicine

## 2012-11-30 ENCOUNTER — Emergency Department (HOSPITAL_COMMUNITY)
Admission: EM | Admit: 2012-11-30 | Discharge: 2012-11-30 | Disposition: A | Discharge status: Home / Self Care | Payer: Self-pay | Attending: Emergency Medicine | Admitting: Emergency Medicine

## 2012-11-30 DIAGNOSIS — E119 Type 2 diabetes mellitus without complications: Secondary | ICD-10-CM | POA: Insufficient documentation

## 2012-11-30 DIAGNOSIS — I10 Essential (primary) hypertension: Secondary | ICD-10-CM | POA: Insufficient documentation

## 2012-11-30 DIAGNOSIS — Z7982 Long term (current) use of aspirin: Secondary | ICD-10-CM | POA: Insufficient documentation

## 2012-11-30 DIAGNOSIS — Z79899 Other long term (current) drug therapy: Secondary | ICD-10-CM | POA: Insufficient documentation

## 2012-11-30 DIAGNOSIS — F17200 Nicotine dependence, unspecified, uncomplicated: Secondary | ICD-10-CM | POA: Insufficient documentation

## 2012-11-30 DIAGNOSIS — R040 Epistaxis: Secondary | ICD-10-CM | POA: Insufficient documentation

## 2012-11-30 LAB — POCT I-STAT, CHEM 8
BUN: 18 mg/dL (ref 6–23)
Calcium, Ion: 1.07 mmol/L — ABNORMAL LOW (ref 1.12–1.23)
Chloride: 103 mEq/L (ref 96–112)
Creatinine, Ser: 1.2 mg/dL — ABNORMAL HIGH (ref 0.50–1.10)
Glucose, Bld: 270 mg/dL — ABNORMAL HIGH (ref 70–99)
HCT: 40 % (ref 36.0–46.0)
Hemoglobin: 13.6 g/dL (ref 12.0–15.0)
Potassium: 3.9 mEq/L (ref 3.5–5.1)
Sodium: 139 mEq/L (ref 135–145)
TCO2: 24 mmol/L (ref 0–100)

## 2012-11-30 LAB — GLUCOSE, CAPILLARY: Glucose-Capillary: 299 mg/dL — ABNORMAL HIGH (ref 70–99)

## 2012-11-30 MED ORDER — GLIPIZIDE 5 MG PO TABS: 5.0000 mg | ORAL_TABLET | Freq: Two times a day (BID) | ORAL | Status: DC

## 2012-11-30 MED ORDER — ATENOLOL 50 MG PO TABS: 50.0000 mg | ORAL_TABLET | Freq: Two times a day (BID) | ORAL | Status: DC

## 2012-11-30 MED ORDER — OLMESARTAN MEDOXOMIL-HCTZ 40-25 MG PO TABS: 1.0000 | ORAL_TABLET | Freq: Every day | ORAL | Status: DC

## 2012-11-30 MED ORDER — GABAPENTIN 300 MG PO CAPS: 300.0000 mg | ORAL_CAPSULE | Freq: Every day | ORAL | Status: DC

## 2012-11-30 NOTE — ED Provider Notes (Signed)
History     CSN: [id number]  Arrival date & time 11/30/12  36   First MD Initiated Contact with Patient 11/30/12 1608      Chief Complaint  Patient presents with  . Epistaxis    (Consider location/radiation/quality/duration/timing/severity/associated sxs/prior treatment) HPI  Brandy Houston is 57 y/o F w/ pmh of htn and DM who presents with CC of Epistaxis. Patient has been off of all medications for the Past 3 months due to insurance problems.  Patient states she's had 3 episodes of nosebleeds since last night.  Resolved with pressure to the nose.  Patient has never had nose bleeds previous to this time.  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  She does not take any blood thinners. Denies fevers, chills, myalgias, arthralgias. Denies DOE, SOB, chest tightness or pressure, radiation to left arm, jaw or back, or diaphoresis. Denies dysuria, flank pain, suprapubic pain, frequency, urgency, or hematuria. Denies headaches, light headedness, weakness, visual disturbances. Denies abdominal pain, nausea, vomiting, diarrhea or constipation.     Past Medical History  Diagnosis Date  . Diabetes mellitus   . Hypertension     Past Surgical History  Procedure Laterality Date  . Cholecystectomy    . Appendectomy    . Small intestine surgery    . Tubal ligation      History reviewed. No pertinent family history.  History  Substance Use Topics  . Smoking status: Current Every Day Smoker -- 0.50 packs/day  . Smokeless tobacco: Never Used  . Alcohol Use: Yes     Comment: ocassionally    OB History   Grav Para Term Preterm Abortions TAB SAB Ect Mult Living                  Review of Systems Ten systems reviewed and are negative for acute change, except as noted in the HPI.   Allergies  Compazine and Omnipaque  Home Medications   Current Outpatient Rx  Name  Route  Sig  Dispense  Refill  . aspirin EC 81 MG tablet   Oral   Take 81 mg  by mouth daily as needed for pain.         . Ibuprofen-Diphenhydramine HCl 200-25 MG CAPS   Oral   Take 2 tablets by mouth every 8 (eight) hours as needed (for pain).         [name] atenolol (TENORMIN) 50 MG tablet   Oral   Take 50 mg by mouth 2 (two) times daily.         [name] gabapentin (NEURONTIN) 300 MG capsule   Oral   Take 300 mg by mouth at bedtime.         [name] glipiZIDE (GLUCOTROL) 5 MG tablet   Oral   Take 5 mg by mouth 2 (two) times daily before a meal.         . olmesartan-hydrochlorothiazide (BENICAR HCT) 40-25 MG per tablet   Oral   Take 1 tablet by mouth daily.           BP 171/105  Pulse 94  Temp(Src) 98 F (36.7 C) (Oral)  Resp 18  SpO2 100%  LMP 03/16/2002  Physical Exam Physical Exam  Nursing note and vitals reviewed. Constitutional: She is oriented to person, place, and time. She appears well-developed and well-nourished. No distress.  HENT:  Head: Normocephalic and atraumatic.  Eyes: Conjunctivae normal and EOM are normal. Pupils are equal, round, and reactive to  light. No scleral icterus.  Nose: dilated vessels in the R Kiesselbach's plexus.  No clots or active bleeding. Neck: Normal range of motion.  Cardiovascular: Normal rate, regular rhythm and normal heart sounds.  Exam reveals no gallop and no friction rub.   No murmur heard. Pulmonary/Chest: Effort normal and breath sounds normal. No respiratory distress.  Abdominal: Soft. Bowel sounds are normal. She exhibits no distension and no mass. There is no tenderness. There is no guarding.  Neurological: She is alert and oriented to person, place, and time.  Skin: Skin is warm and dry. She is not diaphoretic.    ED Course  Procedures (including critical care time)  Labs Reviewed - No data to display No results found.   1. Anterior epistaxis       MDM  4:33 PM BP 171/105  Pulse 94  Temp(Src) 98 F (36.7 C) (Oral)  Resp 18  SpO2 100%  LMP 03/16/2002 Patient with HTN. Off of her  meds  For several months. Will check patient's basic labs. Patient appears to have anterior bleed.   5:52 PM BP 171/105  Pulse 94  Temp(Src) 98 F (36.7 C) (Oral)  Resp 18  SpO2 100%  LMP 03/16/2002 Patient with Slight increase in her creatinine. Not double the last. Elevated to 1.2. I have discussed with Dr. Vanita Panda who feels she is safe for discharge. The patient will be discharged with follow up at [facility] and 1 months worth of medications. Epistaxis is resolved and supportive care instructions given      Margarita Mail, PA-C 11/30/12 1757

## 2012-11-30 NOTE — ED Provider Notes (Signed)
  Medical screening examination/treatment/procedure(s) were performed by non-physician practitioner and as supervising physician I was immediately available for consultation/collaboration.    Carmin Muskrat, MD 11/30/12 [phone]

## 2012-11-30 NOTE — ED Notes (Signed)
Pt states she  Began to have a nosebleed last night, which has since gotten worse.  PT denies injury to nose or blood thinners.

## 2012-11-30 NOTE — Progress Notes (Signed)
[facility] has seen patient and provided her with a oc app and pc resources.

## 2013-02-08 ENCOUNTER — Ambulatory Visit: Payer: Self-pay | Attending: Internal Medicine

## 2013-02-18 ENCOUNTER — Ambulatory Visit: Payer: Self-pay | Attending: Internal Medicine | Admitting: Internal Medicine

## 2013-02-18 ENCOUNTER — Encounter: Payer: Self-pay | Admitting: Internal Medicine

## 2013-02-18 VITALS — BP 209/129 | HR 106 | Temp 99.0°F | Resp 16 | Ht 67.0 in | Wt 177.0 lb

## 2013-02-18 DIAGNOSIS — IMO0001 Reserved for inherently not codable concepts without codable children: Secondary | ICD-10-CM | POA: Insufficient documentation

## 2013-02-18 DIAGNOSIS — F17200 Nicotine dependence, unspecified, uncomplicated: Secondary | ICD-10-CM | POA: Insufficient documentation

## 2013-02-18 DIAGNOSIS — Z79899 Other long term (current) drug therapy: Secondary | ICD-10-CM | POA: Insufficient documentation

## 2013-02-18 DIAGNOSIS — I10 Essential (primary) hypertension: Secondary | ICD-10-CM | POA: Insufficient documentation

## 2013-02-18 DIAGNOSIS — M79609 Pain in unspecified limb: Secondary | ICD-10-CM | POA: Insufficient documentation

## 2013-02-18 DIAGNOSIS — Z7982 Long term (current) use of aspirin: Secondary | ICD-10-CM | POA: Insufficient documentation

## 2013-02-18 HISTORY — DX: Essential (primary) hypertension: I10

## 2013-02-18 MED ORDER — GABAPENTIN 300 MG PO CAPS: 300.0000 mg | ORAL_CAPSULE | Freq: Every day | ORAL | Status: DC

## 2013-02-18 MED ORDER — GLIPIZIDE 5 MG PO TABS: 5.0000 mg | ORAL_TABLET | Freq: Two times a day (BID) | ORAL | Status: DC

## 2013-02-18 MED ORDER — ASPIRIN EC 81 MG PO TBEC: 81.0000 mg | DELAYED_RELEASE_TABLET | Freq: Every day | ORAL | Status: DC | PRN

## 2013-02-18 MED ORDER — LISINOPRIL-HYDROCHLOROTHIAZIDE 20-25 MG PO TABS: 1.0000 | ORAL_TABLET | Freq: Every day | ORAL | Status: DC

## 2013-02-18 MED ORDER — HYDRALAZINE HCL 25 MG PO TABS: 25.0000 mg | ORAL_TABLET | Freq: Three times a day (TID) | ORAL | Status: DC

## 2013-02-18 NOTE — Progress Notes (Signed)
[name], female   DOB: 1955/11/15, 57 y.o.   MRN: [id number] PCP:  Angelica Chessman, MD    Chief Complaint:  Establish care  HPI: 57 year old female with uncontrolled hypertension and diabetes mellitus here to establish care. She has not been on any medications for possible months and was getting it filled at the emergency department. Has not checked her blood glucose or pressure. Complains of pain in her thighs and legs. Denies headache, blurred vision, dizziness, chest pain, palpitations, shortness of breath, weakness, syncope, nausea, vomiting, fever, chills, abdominal pain, bowel symptoms. He reports polyuria and polydipsia. Denies any change in her weight or appetite. She continues to smoke one pack per day.   Allergies: Allergies  Allergen Reactions  . Compazine [Prochlorperazine] Shortness Of Breath  . Omnipaque [Iohexol] Hives    Prior to Admission medications   Medication Sig Start Date End Date Taking? Authorizing Provider  aspirin EC 81 MG tablet Take 81 mg by mouth daily as needed for pain.    Historical Provider, MD  atenolol (TENORMIN) 50 MG tablet Take 1 tablet (50 mg total) by mouth 2 (two) times daily. 11/30/12   Margarita Mail, PA-C  gabapentin (NEURONTIN) 300 MG capsule Take 1 capsule (300 mg total) by mouth at bedtime. 11/30/12   Margarita Mail, PA-C  glipiZIDE (GLUCOTROL) 5 MG tablet Take 1 tablet (5 mg total) by mouth 2 (two) times daily before a meal. 11/30/12   Margarita Mail, PA-C  Ibuprofen-Diphenhydramine HCl 200-25 MG CAPS Take 2 tablets by mouth every 8 (eight) hours as needed (for pain).    Historical Provider, MD  olmesartan-hydrochlorothiazide (BENICAR HCT) 40-25 MG per tablet Take 1 tablet by mouth daily. 11/30/12   Margarita Mail, PA-C    Past Medical History  Diagnosis Date  . Diabetes mellitus   . Hypertension     Past Surgical History  Procedure Laterality Date  . Cholecystectomy    . Appendectomy    . Small intestine surgery    .  Tubal ligation      Social History:  reports that she has been smoking.  She has never used smokeless tobacco. She reports that  drinks alcohol. She reports that she does not use illicit drugs.  History reviewed. No pertinent family history.  Review of Systems:  As outlined in history of present illness  Physical Exam:  Filed Vitals:   02/18/13 1512  BP: 209/129  Pulse: 106  Temp: 99 F (37.2 C)  TempSrc: Oral  Resp: 16  Height: 5\' 7"  (1.702 m)  Weight: 177 lb (80.287 kg)  SpO2: 99%    Constitutional: Vital signs reviewed.  Patient is a well-developed and well-nourished in no acute distress and cooperative with exam. Alert and oriented x3.  HEENT: No pallor, no icterus, moist oral mucosa Cardiovascular: RRR, S1 normal, S2 normal, no MRG,  Pulmonary/Chest: CTAB, no wheezes, rales, or rhonchi Abdominal: Soft. Non-tender, non-distended, bowel sounds are normal, Ext: no edema and no cyanosis, normal range of motion of joints Neurological: AAO x3  Labs on Admission:  No results found for this or any previous visit (from the past 48 hour(s)).  Radiological Exams on Admission: No results found.  Assessment/Plan Hypertensive urgency Patient clinically asymptomatic. Blood pressure markedly elevated . I will prescribe her with lisinopril-HCTZ 20- 25 mg and it hydralazine 25 mg 2 times a day Patient needs to come in to the clinic early next week for lab work and followup on her blood pressure . Instructed to go to  the emergency [place] if she has any symptoms of headache, blurred vision, dizziness, chest pain, palpitations or shortness of breath, syncope or hematuria. Counseled on diet  Restrictions, exercise and medication adherence  Uncontrolled diabetes Check hemoglobin A1c. I will give her a prescription for glipizide and have her followup early next week Check CBC, complete metabolic panel, and lipid panel Check UA  bilateral leg pain Possibly diabetic neuropathy .  prescribe her with Neurontin  Tobacco abuse Counseled on smoking cessation  Follow up early next week  Henri Baumler 02/18/2013, 3:28 PM

## 2013-02-18 NOTE — Progress Notes (Signed)
Pt is here to establish care. Pt states that she has been out of all her medications for over 2 months. Here to refill those medications. Pt is having pain at night in her legs she says that it is hard to sleep. She also so said that she hurt her lower back at work 2 weeks ago and she has sharp pain when she is sitting.

## 2013-02-21 ENCOUNTER — Ambulatory Visit: Payer: No Typology Code available for payment source | Attending: Family Medicine

## 2013-02-21 DIAGNOSIS — I10 Essential (primary) hypertension: Secondary | ICD-10-CM

## 2013-02-21 DIAGNOSIS — IMO0001 Reserved for inherently not codable concepts without codable children: Secondary | ICD-10-CM

## 2013-02-21 LAB — CBC
HCT: 41.5 % (ref 36.0–46.0)
Hemoglobin: 14.6 g/dL (ref 12.0–15.0)
MCH: 32.4 pg (ref 26.0–34.0)
MCHC: 35.2 g/dL (ref 30.0–36.0)
MCV: 92 fL (ref 78.0–100.0)
Platelets: 304 10*3/uL (ref 150–400)
RBC: 4.51 MIL/uL (ref 3.87–5.11)
RDW: 13 % (ref 11.5–15.5)
WBC: 6.8 10*3/uL (ref 4.0–10.5)

## 2013-02-22 LAB — COMPREHENSIVE METABOLIC PANEL
ALT: 80 U/L — ABNORMAL HIGH (ref 0–35)
AST: 27 U/L (ref 0–37)
Albumin: 3.3 g/dL — ABNORMAL LOW (ref 3.5–5.2)
Alkaline Phosphatase: 127 U/L — ABNORMAL HIGH (ref 39–117)
BUN: 26 mg/dL — ABNORMAL HIGH (ref 6–23)
CO2: 34 mEq/L — ABNORMAL HIGH (ref 19–32)
Calcium: 9.7 mg/dL (ref 8.4–10.5)
Chloride: 97 mEq/L (ref 96–112)
Creat: 1.21 mg/dL — ABNORMAL HIGH (ref 0.50–1.10)
Glucose, Bld: 156 mg/dL — ABNORMAL HIGH (ref 70–99)
Potassium: 4.5 mEq/L (ref 3.5–5.3)
Sodium: 134 mEq/L — ABNORMAL LOW (ref 135–145)
Total Bilirubin: 0.4 mg/dL (ref 0.3–1.2)
Total Protein: 7.7 g/dL (ref 6.0–8.3)

## 2013-02-22 LAB — LIPID PANEL
Cholesterol: 163 mg/dL (ref 0–200)
HDL: 73 mg/dL (ref >39)
LDL Cholesterol: 60 mg/dL (ref 0–99)
Total CHOL/HDL Ratio: 2.2 Ratio
Triglycerides: 150 mg/dL — ABNORMAL HIGH (ref <150)
VLDL: 30 mg/dL (ref 0–40)

## 2013-03-17 ENCOUNTER — Ambulatory Visit: Payer: No Typology Code available for payment source | Attending: Family Medicine | Admitting: Internal Medicine

## 2013-03-17 ENCOUNTER — Encounter: Payer: Self-pay | Admitting: Internal Medicine

## 2013-03-17 VITALS — BP 163/80 | HR 109 | Temp 98.0°F | Resp 16 | Ht 67.0 in | Wt 178.0 lb

## 2013-03-17 DIAGNOSIS — K029 Dental caries, unspecified: Secondary | ICD-10-CM | POA: Insufficient documentation

## 2013-03-17 DIAGNOSIS — I10 Essential (primary) hypertension: Secondary | ICD-10-CM

## 2013-03-17 DIAGNOSIS — R079 Chest pain, unspecified: Secondary | ICD-10-CM

## 2013-03-17 DIAGNOSIS — R0602 Shortness of breath: Secondary | ICD-10-CM

## 2013-03-17 DIAGNOSIS — IMO0001 Reserved for inherently not codable concepts without codable children: Secondary | ICD-10-CM

## 2013-03-17 DIAGNOSIS — E119 Type 2 diabetes mellitus without complications: Secondary | ICD-10-CM | POA: Insufficient documentation

## 2013-03-17 MED ORDER — AMOXICILLIN-POT CLAVULANATE 875-125 MG PO TABS: 1.0000 | ORAL_TABLET | Freq: Two times a day (BID) | ORAL | Status: DC

## 2013-03-17 MED ORDER — GLIPIZIDE 5 MG PO TABS: 5.0000 mg | ORAL_TABLET | Freq: Two times a day (BID) | ORAL | Status: DC

## 2013-03-17 MED ORDER — METFORMIN HCL 500 MG PO TABS: 500.0000 mg | ORAL_TABLET | Freq: Every day | ORAL | Status: DC

## 2013-03-17 NOTE — Progress Notes (Signed)
Pt is here for a F/U visit. Pt is here to review her lab results. Pt also needs a referral to a dentist. Pt also requesting medications for her HTN and diabetes.

## 2013-03-17 NOTE — Progress Notes (Signed)
[name], female   DOB: [birth year], 57 y.o.   MRN: [id number] Patient Demographics  Brandy Houston, is a 57 y.o. female  [id number]  [id number]  DOB - [birth year]  Chief Complaint  Patient presents with  . Follow-up        Subjective:   Brandy Houston today is here for a follow up visit. Patient has No headache, No chest pain, No abdominal pain - No Nausea, No new weakness tingling or numbness, No Cough - SOB, states needs dental referral, having lot of pain in the front teeth, had some swelling this week - Spot CBG 252, states that blood sugars been running in 200 at home's    Objective:    Filed Vitals:   03/17/13 1256  BP: 163/80  Pulse: 109  Temp: 98 F (36.7 C)  TempSrc: Oral  Resp: 16  Height: 5\' 7"  (1.702 m)  Weight: 178 lb (80.74 kg)  SpO2: 98%     ALLERGIES:   Allergies  Allergen Reactions  . Compazine [Prochlorperazine] Shortness Of Breath  . Omnipaque [Iohexol] Hives    PAST MEDICAL HISTORY: Past Medical History  Diagnosis Date  . Diabetes mellitus   . Hypertension     MEDICATIONS AT HOME: Prior to Admission medications   Medication Sig Start Date End Date Taking? Authorizing Provider  aspirin EC 81 MG tablet Take 1 tablet (81 mg total) by mouth daily as needed for pain. 02/18/13  Yes Nishant Dhungel, MD  gabapentin (NEURONTIN) 300 MG capsule Take 1 capsule (300 mg total) by mouth at bedtime. 02/18/13  Yes Nishant Dhungel, MD  glipiZIDE (GLUCOTROL) 5 MG tablet Take 1 tablet (5 mg total) by mouth 2 (two) times daily before a meal. 03/17/13  Yes Ripudeep K Rai, MD  hydrALAZINE (APRESOLINE) 25 MG tablet Take 1 tablet (25 mg total) by mouth 3 (three) times daily. 02/18/13  Yes Nishant Dhungel, MD  lisinopril-hydrochlorothiazide (PRINZIDE,ZESTORETIC) 20-25 MG per tablet Take 1 tablet by mouth daily. 02/18/13  Yes Nishant Dhungel, MD  amoxicillin-clavulanate (AUGMENTIN) 875-125 MG per tablet Take 1 tablet by mouth 2 (two) times daily. X 10days  03/17/13   Ripudeep Krystal Eaton, MD  Ibuprofen-Diphenhydramine HCl 200-25 MG CAPS Take 2 tablets by mouth every 8 (eight) hours as needed (for pain).    Historical Provider, MD  metFORMIN (GLUCOPHAGE) 500 MG tablet Take 1 tablet (500 mg total) by mouth daily with breakfast. 03/17/13   Ripudeep Krystal Eaton, MD     Exam  General appearance :Awake, alert, NAD, Speech Clear.  HEENT: Atraumatic and Normocephalic, PERLA Neck: supple, no JVD. No cervical lymphadenopathy.  Chest: Clear to auscultation bilaterally, no wheezing, rales or rhonchi CVS: S1 S2 regular, no murmurs.  Abdomen: soft, NBS, NT, ND, no gaurding, rigidity or rebound. Extremities: no cyanosis or clubbing, B/L Lower Ext shows no edema Neurology: Awake alert, and oriented X 3, CN II-XII intact, Non focal Skin: No Rash or lesions Wounds:N/A    Data Review   Basic Metabolic Panel: No results found for this basename: NA, K, CL, CO2, GLUCOSE, BUN, CREATININE, CALCIUM, MG, PHOS,  in the last 168 hours Liver Function Tests: No results found for this basename: AST, ALT, ALKPHOS, BILITOT, PROT, ALBUMIN,  in the last 168 hours  CBC: No results found for this basename: WBC, NEUTROABS, HGB, HCT, MCV, PLT,  in the last 168 hours  ------------------------------------------------------------------------------------------------------------------ No results found for this basename: HGBA1C,  in the last 72 hours ------------------------------------------------------------------------------------------------------------------ No results found for this basename: CHOL,  HDL, LDLCALC, TRIG, CHOLHDL, LDLDIRECT,  in the last 72 hours ------------------------------------------------------------------------------------------------------------------ No results found for this basename: TSH, T4TOTAL, FREET3, T3FREE, THYROIDAB,  in the last 72  hours ------------------------------------------------------------------------------------------------------------------ No results found for this basename: VITAMINB12, FOLATE, FERRITIN, TIBC, IRON, RETICCTPCT,  in the last 72 hours  Coagulation profile  No results found for this basename: INR, PROTIME,  in the last 168 hours    Assessment & Plan   Active Problems: Diabetes mellitus: - Had run out of glipizide yesterday - Refilled glipizide 5 mg BID, added metformin 500 mg with breakfast  Dental caries - Ordered Augmentin x 10day, she has ibuprofen at home    Follow-up in 4 weeks     RAI,RIPUDEEP M.D. 03/17/2013, 1:01 PM

## 2013-04-14 ENCOUNTER — Ambulatory Visit: Payer: No Typology Code available for payment source | Attending: Internal Medicine | Admitting: Internal Medicine

## 2013-04-14 ENCOUNTER — Encounter: Payer: Self-pay | Admitting: Internal Medicine

## 2013-04-14 VITALS — BP 167/99 | HR 80 | Temp 99.1°F | Resp 16 | Ht 67.0 in | Wt 178.0 lb

## 2013-04-14 DIAGNOSIS — E119 Type 2 diabetes mellitus without complications: Secondary | ICD-10-CM | POA: Insufficient documentation

## 2013-04-14 DIAGNOSIS — G609 Hereditary and idiopathic neuropathy, unspecified: Secondary | ICD-10-CM

## 2013-04-14 DIAGNOSIS — I10 Essential (primary) hypertension: Secondary | ICD-10-CM

## 2013-04-14 MED ORDER — HYDRALAZINE HCL 25 MG PO TABS: 25.0000 mg | ORAL_TABLET | Freq: Three times a day (TID) | ORAL | Status: DC

## 2013-04-14 MED ORDER — ASPIRIN EC 81 MG PO TBEC: 81.0000 mg | DELAYED_RELEASE_TABLET | Freq: Every day | ORAL | Status: DC | PRN

## 2013-04-14 MED ORDER — METFORMIN HCL 500 MG PO TABS: 500.0000 mg | ORAL_TABLET | Freq: Two times a day (BID) | ORAL | Status: DC

## 2013-04-14 MED ORDER — LISINOPRIL-HYDROCHLOROTHIAZIDE 20-25 MG PO TABS: 1.0000 | ORAL_TABLET | Freq: Every day | ORAL | Status: DC

## 2013-04-14 MED ORDER — GLIPIZIDE 5 MG PO TABS: 5.0000 mg | ORAL_TABLET | Freq: Two times a day (BID) | ORAL | Status: DC

## 2013-04-14 MED ORDER — GABAPENTIN 300 MG PO CAPS: 300.0000 mg | ORAL_CAPSULE | Freq: Every day | ORAL | Status: DC

## 2013-04-14 NOTE — Progress Notes (Signed)
Pt here f/u HTN,Diabetes Ran out of all medications Not taken bp meds x1 week. Educated pt

## 2013-04-14 NOTE — Progress Notes (Signed)
Patient Demographics  Brandy Houston, is a 57 y.o. female  [id number]  [id number]  DOB - [birth year]  Chief Complaint  Patient presents with  . Follow-up  . Medication Refill       Subjective:   Brandy Houston today is here for a follow up visit. She has no complaints, she claims she ran out off her antihypertensive medications a few weeks ago, she claims she is still taking her oral hypoglycemic agents. She claims she has financial difficulties   Patient has No headache, No chest pain, No abdominal pain - No Nausea, No new weakness tingling or numbness, No Cough - SOB.  Objective:    Filed Vitals:   04/14/13 1042  BP: 167/99  Pulse: 80  Temp: 99.1 F (37.3 C)  TempSrc: Oral  Resp: 16  Height: 5\' 7"  (1.702 m)  Weight: 178 lb (80.74 kg)  SpO2: 97%     ALLERGIES:   Allergies  Allergen Reactions  . Compazine [Prochlorperazine] Shortness Of Breath  . Omnipaque [Iohexol] Hives    PAST MEDICAL HISTORY: Past Medical History  Diagnosis Date  . Diabetes mellitus   . Hypertension     MEDICATIONS AT HOME: Prior to Admission medications   Medication Sig Start Date End Date Taking? Authorizing Provider  aspirin EC 81 MG tablet Take 1 tablet (81 mg total) by mouth daily as needed for pain. 04/14/13  Yes Hillis Mcphatter Kristeen Mans, MD  metFORMIN (GLUCOPHAGE) 500 MG tablet Take 1 tablet (500 mg total) by mouth 2 (two) times daily with a meal. 04/14/13  Yes Jaymond Waage Kristeen Mans, MD  amoxicillin-clavulanate (AUGMENTIN) 875-125 MG per tablet Take 1 tablet by mouth 2 (two) times daily. X 10days 03/17/13   Ripudeep Krystal Eaton, MD  gabapentin (NEURONTIN) 300 MG capsule Take 1 capsule (300 mg total) by mouth at bedtime. 04/14/13   Lorren Rossetti Kristeen Mans, MD  glipiZIDE (GLUCOTROL) 5 MG tablet Take 1 tablet (5 mg total) by mouth 2 (two) times daily before a meal. 04/14/13   Shandel Busic Kristeen Mans, MD  hydrALAZINE (APRESOLINE) 25 MG tablet Take 1 tablet (25 mg total) by mouth 3 (three) times daily. 04/14/13    Darion Juhasz Kristeen Mans, MD  lisinopril-hydrochlorothiazide (PRINZIDE,ZESTORETIC) 20-25 MG per tablet Take 1 tablet by mouth daily. 04/14/13   Mande Auvil Kristeen Mans, MD     Exam  General appearance :Awake, alert, not in any distress. Speech Clear. Not toxic Looking HEENT: Atraumatic and Normocephalic, pupils equally reactive to light and accomodation Neck: supple, no JVD. No cervical lymphadenopathy.  Chest:Good air entry bilaterally, no added sounds  CVS: S1 S2 regular, no murmurs.  Abdomen: Bowel sounds present, Non tender and not distended with no gaurding, rigidity or rebound. Extremities: B/L Lower Ext shows no edema, both legs are warm to touch Neurology: Awake alert, and oriented X 3, CN II-XII intact, Non focal Skin:No Rash Wounds:N/A    Data Review   CBC No results found for this basename: WBC, HGB, HCT, PLT, MCV, MCH, MCHC, RDW, NEUTRABS, LYMPHSABS, MONOABS, EOSABS, BASOSABS, BANDABS, BANDSABD,  in the last 168 hours  Chemistries   No results found for this basename: NA, K, CL, CO2, GLUCOSE, BUN, CREATININE, GFRCGP, CALCIUM, MG, AST, ALT, ALKPHOS, BILITOT,  in the last 168 hours ------------------------------------------------------------------------------------------------------------------ No results found for this basename: HGBA1C,  in the last 72 hours ------------------------------------------------------------------------------------------------------------------ No results found for this basename: CHOL, HDL, LDLCALC, TRIG, CHOLHDL, LDLDIRECT,  in the last 72 hours ------------------------------------------------------------------------------------------------------------------ No results found for this basename: TSH, T4TOTAL, FREET3, T3FREE, THYROIDAB,  in the last 72 hours ------------------------------------------------------------------------------------------------------------------ No results found for this basename: VITAMINB12, FOLATE, FERRITIN, TIBC, IRON, RETICCTPCT,   in the last 72 hours  Coagulation profile  No results found for this basename: INR, PROTIME,  in the last 168 hours    Assessment & Plan   Uncontrolled hypertension - Secondary to noncompliance-ran out of the medication - Refilled HCTZ/lisinopril combination and hydralazine-with 3 refills  Diabetes type 2 - Check baseline A1c - Continue with metformin and glipizide-3 refills  Neuropathy - Continue with gabapentin   Health Maintenance -Colonoscopy: Refer to GI -Pap Smear: Refer to GYN -Mammogram: Order - Refer to pulmonology-4 screening retinopathy  Follow up in 3 months  The patient was given clear instructions to go to ER or return to medical center if symptoms don't improve, worsen or new problems develop. The patient verbalized understanding. The patient was told to call to get lab results if they haven't heard anything in the next week.

## 2013-05-31 ENCOUNTER — Encounter: Payer: Self-pay | Admitting: Nurse Practitioner

## 2013-06-24 DIAGNOSIS — G629 Polyneuropathy, unspecified: Secondary | ICD-10-CM | POA: Insufficient documentation

## 2013-06-24 DIAGNOSIS — F17200 Nicotine dependence, unspecified, uncomplicated: Secondary | ICD-10-CM | POA: Insufficient documentation

## 2013-06-27 ENCOUNTER — Ambulatory Visit: Payer: No Typology Code available for payment source | Admitting: Internal Medicine

## 2013-07-15 ENCOUNTER — Encounter: Payer: Self-pay | Admitting: Nurse Practitioner

## 2013-08-17 ENCOUNTER — Ambulatory Visit: Payer: Self-pay

## 2013-08-22 ENCOUNTER — Ambulatory Visit: Payer: Self-pay

## 2013-08-31 ENCOUNTER — Encounter: Payer: Self-pay | Admitting: Family Medicine

## 2013-09-19 ENCOUNTER — Ambulatory Visit: Payer: Self-pay

## 2013-10-07 ENCOUNTER — Ambulatory Visit: Payer: No Typology Code available for payment source | Attending: Internal Medicine

## 2013-11-01 ENCOUNTER — Other Ambulatory Visit: Payer: Self-pay | Admitting: Internal Medicine

## 2013-11-01 DIAGNOSIS — E119 Type 2 diabetes mellitus without complications: Secondary | ICD-10-CM

## 2013-12-15 ENCOUNTER — Ambulatory Visit: Payer: No Typology Code available for payment source | Attending: Internal Medicine | Admitting: Internal Medicine

## 2013-12-15 ENCOUNTER — Encounter: Payer: Self-pay | Admitting: Internal Medicine

## 2013-12-15 VITALS — BP 181/88 | HR 108 | Temp 99.0°F | Resp 20 | Ht 67.0 in | Wt 190.6 lb

## 2013-12-15 DIAGNOSIS — N951 Menopausal and female climacteric states: Secondary | ICD-10-CM | POA: Insufficient documentation

## 2013-12-15 DIAGNOSIS — F411 Generalized anxiety disorder: Secondary | ICD-10-CM | POA: Insufficient documentation

## 2013-12-15 DIAGNOSIS — F17200 Nicotine dependence, unspecified, uncomplicated: Secondary | ICD-10-CM | POA: Insufficient documentation

## 2013-12-15 DIAGNOSIS — R232 Flushing: Secondary | ICD-10-CM

## 2013-12-15 DIAGNOSIS — IMO0001 Reserved for inherently not codable concepts without codable children: Secondary | ICD-10-CM | POA: Insufficient documentation

## 2013-12-15 DIAGNOSIS — I10 Essential (primary) hypertension: Secondary | ICD-10-CM | POA: Insufficient documentation

## 2013-12-15 DIAGNOSIS — E1165 Type 2 diabetes mellitus with hyperglycemia: Principal | ICD-10-CM

## 2013-12-15 LAB — COMPLETE METABOLIC PANEL WITH GFR
ALT: 55 U/L — ABNORMAL HIGH (ref 0–35)
AST: 21 U/L (ref 0–37)
Albumin: 3.4 g/dL — ABNORMAL LOW (ref 3.5–5.2)
Alkaline Phosphatase: 73 U/L (ref 39–117)
BUN: 28 mg/dL — ABNORMAL HIGH (ref 6–23)
CO2: 32 mEq/L (ref 19–32)
Calcium: 9.4 mg/dL (ref 8.4–10.5)
Chloride: 98 mEq/L (ref 96–112)
Creat: 1.26 mg/dL — ABNORMAL HIGH (ref 0.50–1.10)
GFR, Est African American: 54 mL/min — ABNORMAL LOW
GFR, Est Non African American: 47 mL/min — ABNORMAL LOW
Glucose, Bld: 117 mg/dL — ABNORMAL HIGH (ref 70–99)
Potassium: 4.3 mEq/L (ref 3.5–5.3)
Sodium: 138 mEq/L (ref 135–145)
Total Bilirubin: 0.3 mg/dL (ref 0.2–1.2)
Total Protein: 7.5 g/dL (ref 6.0–8.3)

## 2013-12-15 LAB — GLUCOSE, POCT (MANUAL RESULT ENTRY): POC Glucose: 138 mg/dl — AB (ref 70–99)

## 2013-12-15 LAB — POCT GLYCOSYLATED HEMOGLOBIN (HGB A1C): Hemoglobin A1C: 7.3

## 2013-12-15 MED ORDER — LISINOPRIL-HYDROCHLOROTHIAZIDE 20-25 MG PO TABS: 1.0000 | ORAL_TABLET | Freq: Every day | ORAL | Status: DC

## 2013-12-15 MED ORDER — GABAPENTIN 300 MG PO CAPS: 300.0000 mg | ORAL_CAPSULE | Freq: Two times a day (BID) | ORAL | Status: DC

## 2013-12-15 MED ORDER — GLIPIZIDE 5 MG PO TABS
5.0000 mg | ORAL_TABLET | Freq: Two times a day (BID) | ORAL | Status: DC
Start: 2013-12-15 — End: 2014-02-27

## 2013-12-15 MED ORDER — METFORMIN HCL 500 MG PO TABS: 500.0000 mg | ORAL_TABLET | Freq: Two times a day (BID) | ORAL | Status: DC

## 2013-12-15 MED ORDER — HYDRALAZINE HCL 25 MG PO TABS: 25.0000 mg | ORAL_TABLET | Freq: Three times a day (TID) | ORAL | Status: DC

## 2013-12-15 MED ORDER — PAROXETINE HCL 10 MG PO TABS: 10.0000 mg | ORAL_TABLET | Freq: Every day | ORAL | Status: DC

## 2013-12-15 NOTE — Patient Instructions (Signed)
DASH Eating Plan  DASH stands for "Dietary Approaches to Stop Hypertension." The DASH eating plan is a healthy eating plan that has been shown to reduce high blood pressure (hypertension). Additional health benefits may include reducing the risk of type 2 diabetes mellitus, heart disease, and stroke. The DASH eating plan may also help with weight loss.  WHAT DO I NEED TO KNOW ABOUT THE DASH EATING PLAN?  For the DASH eating plan, you will follow these general guidelines:  · Choose foods with a percent daily value for sodium of less than 5% (as listed on the food label).  · Use salt-free seasonings or herbs instead of table salt or sea salt.  · Check with your health care provider or pharmacist before using salt substitutes.  · Eat lower-sodium products, often labeled as "lower sodium" or "no salt added."  · Eat fresh foods.  · Eat more vegetables, fruits, and low-fat dairy products.  · Choose whole grains. Look for the word "whole" as the first word in the ingredient list.  · Choose fish and skinless chicken or turkey more often than red meat. Limit fish, poultry, and meat to 6 oz (170 g) each day.  · Limit sweets, desserts, sugars, and sugary drinks.  · Choose heart-healthy fats.  · Limit cheese to 1 oz (28 g) per day.  · Eat more home-cooked food and less restaurant, buffet, and fast food.  · Limit fried foods.  · Cook foods using methods other than frying.  · Limit canned vegetables. If you do use them, rinse them well to decrease the sodium.  · When eating at a restaurant, ask that your food be prepared with less salt, or no salt if possible.  WHAT FOODS CAN I EAT?  Seek help from a dietitian for individual calorie needs.  Grains  Whole grain or whole wheat bread. Brown rice. Whole grain or whole wheat pasta. Quinoa, bulgur, and whole grain cereals. Low-sodium cereals. Corn or whole wheat flour tortillas. Whole grain cornbread. Whole grain crackers. Low-sodium crackers.  Vegetables  Fresh or frozen vegetables  (raw, steamed, roasted, or grilled). Low-sodium or reduced-sodium tomato and vegetable juices. Low-sodium or reduced-sodium tomato sauce and paste. Low-sodium or reduced-sodium canned vegetables.   Fruits  All fresh, canned (in natural juice), or frozen fruits.  Meat and Other Protein Products  Ground beef (85% or leaner), grass-fed beef, or beef trimmed of fat. Skinless chicken or turkey. Ground chicken or turkey. Pork trimmed of fat. All fish and seafood. Eggs. Dried beans, peas, or lentils. Unsalted nuts and seeds. Unsalted canned beans.  Dairy  Low-fat dairy products, such as skim or 1% milk, 2% or reduced-fat cheeses, low-fat ricotta or cottage cheese, or plain low-fat yogurt. Low-sodium or reduced-sodium cheeses.  Fats and Oils  Tub margarines without trans fats. Light or reduced-fat mayonnaise and salad dressings (reduced sodium). Avocado. Safflower, olive, or canola oils. Natural peanut or almond butter.  Other  Unsalted popcorn and pretzels.  The items listed above may not be a complete list of recommended foods or beverages. Contact your dietitian for more options.  WHAT FOODS ARE NOT RECOMMENDED?  Grains  White bread. White pasta. White rice. Refined cornbread. Bagels and croissants. Crackers that contain trans fat.  Vegetables  Creamed or fried vegetables. Vegetables in a cheese sauce. Regular canned vegetables. Regular canned tomato sauce and paste. Regular tomato and vegetable juices.  Fruits  Dried fruits. Canned fruit in light or heavy syrup. Fruit juice.  Meat and Other Protein   Products  Fatty cuts of meat. Ribs, chicken wings, bacon, sausage, bologna, salami, chitterlings, fatback, hot dogs, bratwurst, and packaged luncheon meats. Salted nuts and seeds. Canned beans with salt.  Dairy  Whole or 2% milk, cream, half-and-half, and cream cheese. Whole-fat or sweetened yogurt. Full-fat cheeses or blue cheese. Nondairy creamers and whipped toppings. Processed cheese, cheese spreads, or cheese  curds.  Condiments  Onion and garlic salt, seasoned salt, table salt, and sea salt. Canned and packaged gravies. Worcestershire sauce. Tartar sauce. Barbecue sauce. Teriyaki sauce. Soy sauce, including reduced sodium. Steak sauce. Fish sauce. Oyster sauce. Cocktail sauce. Horseradish. Ketchup and mustard. Meat flavorings and tenderizers. Bouillon cubes. Hot sauce. Tabasco sauce. Marinades. Taco seasonings. Relishes.  Fats and Oils  Butter, stick margarine, lard, shortening, ghee, and bacon fat. Coconut, palm kernel, or palm oils. Regular salad dressings.  Other  Pickles and olives. Salted popcorn and pretzels.  The items listed above may not be a complete list of foods and beverages to avoid. Contact your dietitian for more information.  WHERE CAN I FIND MORE INFORMATION?  National Heart, Lung, and [employer] Institute: www.nhlbi.nih.gov/health/health-topics/topics/dash/  Document Released: 05/22/2011 Document Revised: 06/07/2013 Document Reviewed: 04/06/2013  ExitCare® Patient Information ©2015 [employer]. This information is not intended to replace advice given to you by your health care provider. Make sure you discuss any questions you have with your health care provider.

## 2013-12-15 NOTE — Progress Notes (Signed)
[name], female   DOB: Oct 14, 1955, 58 y.o.   MRN: [id number]  CC: DM, HTN  HPI:  Patient reports that she has been out of BP meds for 3 days and DM meds for "a while".  Patient reports anxiety, hot flashes, crying spells for 5 months.  Patient does not currently check blood sugars.   Allergies  Allergen Reactions  . Compazine [Prochlorperazine] Shortness Of Breath and Swelling    TONGUE SWELLS  . Omnipaque [Iohexol] Hives  . Shellfish-Derived Products Anaphylaxis  . Iodinated Diagnostic Agents Rash   Past Medical History  Diagnosis Date  . Diabetes mellitus   . Hypertension    Current Outpatient Prescriptions on File Prior to Visit  Medication Sig Dispense Refill  . aspirin EC 81 MG tablet Take 1 tablet (81 mg total) by mouth daily as needed for pain.  30 tablet  3  . amoxicillin-clavulanate (AUGMENTIN) 875-125 MG per tablet Take 1 tablet by mouth 2 (two) times daily. X 10days  20 tablet  0   No current facility-administered medications on file prior to visit.   Family History  Problem Relation Age of Onset  . Cancer Mother   . Heart disease Father    History   Social History  . Marital Status: Single    Spouse Name: N/A    Number of Children: N/A  . Years of Education: N/A   Occupational History  . Not on file.   Social History Main Topics  . Smoking status: Current Every Day Smoker -- 0.50 packs/day  . Smokeless tobacco: Never Used  . Alcohol Use: Yes     Comment: ocassionally  . Drug Use: No  . Sexual Activity: No   Other Topics Concern  . Not on file   Social History Narrative  . No narrative on file   Review of Systems  Eyes: Negative for blurred vision and double vision.  Cardiovascular: Negative.   Genitourinary: Negative.   Neurological: Negative for dizziness, tingling and headaches.  Endo/Heme/Allergies: Negative for polydipsia.  Psychiatric/Behavioral: The patient is nervous/anxious.      Objective:   Filed Vitals:   12/15/13 1111  BP: 181/88  Pulse: 108  Temp: 99 F (37.2 C)  Resp: 20    Physical Exam: Constitutional: Patient appears well-developed and well-nourished. No distress. Neck: Normal ROM. Neck supple. No JVD. No tracheal deviation. No thyromegaly. CVS: RRR, S1/S2 +, no murmurs, no gallops, no carotid bruit.  Pulmonary: Effort and breath sounds normal, no stridor, rhonchi, wheezes, rales.  Abdominal: Soft. BS +,  no distension, tenderness, rebound or guarding.  Musculoskeletal: Normal range of motion. No edema and no tenderness.  Lymphadenopathy: No lymphadenopathy noted, cervical, Neuro: Alert. Normal reflexes, muscle tone coordination. No cranial nerve deficit. Skin: Skin is warm and dry. No rash noted. Not diaphoretic. No erythema. No pallor. Psychiatric: Normal mood and affect. Behavior, judgment, thought content normal.  Lab Results  Component Value Date   WBC 6.8 02/21/2013   HGB 14.6 02/21/2013   HCT 41.5 02/21/2013   MCV 92.0 02/21/2013   PLT 304 02/21/2013   Lab Results  Component Value Date   CREATININE 1.21* 02/21/2013   BUN 26* 02/21/2013   NA 134* 02/21/2013   K 4.5 02/21/2013   CL 97 02/21/2013   CO2 34* 02/21/2013    Lab Results  Component Value Date   HGBA1C 7.3 12/15/2013   Lipid Panel     Component Value Date/Time   CHOL 163 02/21/2013 1051  TRIG 150* 02/21/2013 1051   HDL 73 02/21/2013 1051   CHOLHDL 2.2 02/21/2013 1051   VLDL 30 02/21/2013 1051   LDLCALC 60 02/21/2013 1051       Assessment and plan:   Brandy Houston was seen today for follow-up, diabetes and hypertension.  Diagnoses and associated orders for this visit:  Type II or unspecified type diabetes mellitus without mention of complication, uncontrolled - Glucose (CBG) - HgB A1c - Amb Referral to Nutrition and Diabetic E - glipiZIDE (GLUCOTROL) 5 MG tablet; Take 1 tablet (5 mg total) by mouth 2 (two) times daily before a meal. - metFORMIN (GLUCOPHAGE) 500 MG tablet; Take 1 tablet (500 mg total) by mouth 2 (two) times  daily with a meal. - gabapentin (NEURONTIN) 300 MG capsule; Take 1 capsule (300 mg total) by mouth 2 (two) times daily.  Essential hypertension - hydrALAZINE (APRESOLINE) 25 MG tablet; Take 1 tablet (25 mg total) by mouth 3 (three) times daily. - lisinopril-hydrochlorothiazide (PRINZIDE,ZESTORETIC) 20-25 MG per tablet; Take 1 tablet by mouth daily. - COMPLETE METABOLIC PANEL WITH GFR  Anxiety state, unspecified - TSH  Hot flashes - PARoxetine (PAXIL) 10 MG tablet; Take 1 tablet (10 mg total) by mouth daily.   Return in about 3 weeks (around 01/05/2014) for Nurse Visit-BP check, 3 mo Pcp-DM,HTN, PAP.       Brandy Manning, NP-C [facility] [phone] 12/15/2013, 11:52 AM

## 2013-12-15 NOTE — Progress Notes (Signed)
Patient presents to F/U on DM and HTN States she ran out of BP meds 3 weeks ago. C/O hot flashes for 9 years. Also C/O increased anxiety over last 2-3 months.

## 2013-12-16 LAB — TSH: TSH: 0.472 u[IU]/mL (ref 0.350–4.500)

## 2013-12-18 ENCOUNTER — Other Ambulatory Visit: Payer: Self-pay | Admitting: Internal Medicine

## 2013-12-19 ENCOUNTER — Telehealth: Payer: Self-pay | Admitting: *Deleted

## 2013-12-19 NOTE — Telephone Encounter (Signed)
Patient notified of lab results and instructions. States understanding.     Notes Recorded by Lance Bosch, NP on 12/18/2013 at 8:29 PM Please call patient and informed her that her kidney function is still elevated and therefore we will need to discontinue the metformin. Explained to patient to prevent further damage to kidneys we will need to have tight control over her diabetes and hypertension. Let patient know that she will need to monitor her sugar over next couple weeks, if her blood sugar begins to increase she will need to call the office back for further instructions. We could possibly increase her glipizide if needed.

## 2014-01-17 ENCOUNTER — Encounter: Payer: No Typology Code available for payment source | Attending: Internal Medicine

## 2014-01-17 VITALS — Ht 67.0 in | Wt 190.0 lb

## 2014-01-17 DIAGNOSIS — Z713 Dietary counseling and surveillance: Secondary | ICD-10-CM | POA: Insufficient documentation

## 2014-01-17 DIAGNOSIS — E119 Type 2 diabetes mellitus without complications: Secondary | ICD-10-CM

## 2014-01-20 NOTE — Progress Notes (Signed)
Patient was seen on 01/17/14 for the first of a series of three diabetes self-management courses at the [facility].  Patient Education Plan per assessed needs and concerns is to attend four course education program for Diabetes Self Management Education.  Current HbA1c: 7.3%  The following learning objectives were met by the patient during this class:  Describe diabetes  State some common risk factors for diabetes  Defines the role of glucose and insulin  Identifies type of diabetes and pathophysiology  Describe the relationship between diabetes and cardiovascular risk  State the members of the Healthcare Team  States the rationale for glucose monitoring  State when to test glucose  State their individual Target Range  State the importance of logging glucose readings  Describe how to interpret glucose readings  Identifies A1C target  Explain the correlation between A1c and eAG values  State symptoms and treatment of high blood glucose  State symptoms and treatment of low blood glucose  Explain proper technique for glucose testing  Identifies proper sharps disposal  Handouts given during class include:  Living Well with Diabetes book  Carb Counting and Meal Planning book  Meal Plan Card  Carbohydrate guide  Meal planning worksheet  Low Sodium Flavoring Tips  The diabetes portion plate  N3V to eAG Conversion Chart  Diabetes Medications  Diabetes Recommended Care Schedule  Support Group  Diabetes Success Plan  Core Class Satisfaction Survey  Follow-Up Plan:  Attend core 2

## 2014-01-24 DIAGNOSIS — E119 Type 2 diabetes mellitus without complications: Secondary | ICD-10-CM

## 2014-01-24 NOTE — Progress Notes (Signed)

## 2014-01-25 ENCOUNTER — Other Ambulatory Visit: Payer: Self-pay | Admitting: Internal Medicine

## 2014-01-25 DIAGNOSIS — IMO0001 Reserved for inherently not codable concepts without codable children: Secondary | ICD-10-CM

## 2014-01-25 DIAGNOSIS — E1165 Type 2 diabetes mellitus with hyperglycemia: Principal | ICD-10-CM

## 2014-01-25 MED ORDER — TRUEPLUS LANCETS 30G MISC: Status: DC

## 2014-01-25 MED ORDER — ON CALL EXPRESS GLUCOSE METER DEVI: Status: DC

## 2014-01-25 MED ORDER — GLUCOSE BLOOD VI STRP: ORAL_STRIP | Status: DC

## 2014-01-31 ENCOUNTER — Ambulatory Visit: Payer: No Typology Code available for payment source

## 2014-02-27 ENCOUNTER — Ambulatory Visit: Payer: No Typology Code available for payment source | Attending: Internal Medicine | Admitting: Internal Medicine

## 2014-02-27 ENCOUNTER — Encounter: Payer: Self-pay | Admitting: Internal Medicine

## 2014-02-27 VITALS — BP 181/94 | HR 103 | Temp 98.9°F | Resp 16 | Ht 67.0 in | Wt 190.0 lb

## 2014-02-27 DIAGNOSIS — Z2821 Immunization not carried out because of patient refusal: Secondary | ICD-10-CM

## 2014-02-27 DIAGNOSIS — R799 Abnormal finding of blood chemistry, unspecified: Secondary | ICD-10-CM

## 2014-02-27 DIAGNOSIS — Z794 Long term (current) use of insulin: Secondary | ICD-10-CM | POA: Insufficient documentation

## 2014-02-27 DIAGNOSIS — Z91199 Patient's noncompliance with other medical treatment and regimen due to unspecified reason: Secondary | ICD-10-CM | POA: Insufficient documentation

## 2014-02-27 DIAGNOSIS — E119 Type 2 diabetes mellitus without complications: Secondary | ICD-10-CM | POA: Insufficient documentation

## 2014-02-27 DIAGNOSIS — Z9119 Patient's noncompliance with other medical treatment and regimen: Secondary | ICD-10-CM | POA: Insufficient documentation

## 2014-02-27 DIAGNOSIS — F17200 Nicotine dependence, unspecified, uncomplicated: Secondary | ICD-10-CM | POA: Insufficient documentation

## 2014-02-27 DIAGNOSIS — R944 Abnormal results of kidney function studies: Secondary | ICD-10-CM | POA: Insufficient documentation

## 2014-02-27 DIAGNOSIS — Z7982 Long term (current) use of aspirin: Secondary | ICD-10-CM | POA: Insufficient documentation

## 2014-02-27 DIAGNOSIS — R7989 Other specified abnormal findings of blood chemistry: Secondary | ICD-10-CM

## 2014-02-27 DIAGNOSIS — I10 Essential (primary) hypertension: Secondary | ICD-10-CM | POA: Insufficient documentation

## 2014-02-27 LAB — COMPLETE METABOLIC PANEL WITH GFR
ALT: 40 U/L — ABNORMAL HIGH (ref 0–35)
AST: 17 U/L (ref 0–37)
Albumin: 3.3 g/dL — ABNORMAL LOW (ref 3.5–5.2)
Alkaline Phosphatase: 150 U/L — ABNORMAL HIGH (ref 39–117)
BUN: 29 mg/dL — ABNORMAL HIGH (ref 6–23)
CO2: 29 mEq/L (ref 19–32)
Calcium: 9.1 mg/dL (ref 8.4–10.5)
Chloride: 102 mEq/L (ref 96–112)
Creat: 1.15 mg/dL — ABNORMAL HIGH (ref 0.50–1.10)
GFR, Est African American: 60 mL/min
GFR, Est Non African American: 52 mL/min — ABNORMAL LOW
Glucose, Bld: 169 mg/dL — ABNORMAL HIGH (ref 70–99)
Potassium: 4.3 mEq/L (ref 3.5–5.3)
Sodium: 140 mEq/L (ref 135–145)
Total Bilirubin: 0.3 mg/dL (ref 0.2–1.2)
Total Protein: 7.2 g/dL (ref 6.0–8.3)

## 2014-02-27 LAB — GLUCOSE, POCT (MANUAL RESULT ENTRY): POC Glucose: 215 mg/dl — AB (ref 70–99)

## 2014-02-27 MED ORDER — GLIPIZIDE 10 MG PO TABS: 10.0000 mg | ORAL_TABLET | Freq: Two times a day (BID) | ORAL | Status: DC

## 2014-02-27 MED ORDER — AMLODIPINE BESYLATE 10 MG PO TABS: 10.0000 mg | ORAL_TABLET | Freq: Every day | ORAL | Status: DC

## 2014-02-27 MED ORDER — HYDRALAZINE HCL 25 MG PO TABS: 25.0000 mg | ORAL_TABLET | Freq: Three times a day (TID) | ORAL | Status: DC

## 2014-02-27 NOTE — Progress Notes (Signed)
[name], female   DOB: 09-Feb-1956, 58 y.o.   MRN: [id number]  CC: DM/HTN  HPI:  Patient presents today for a follow up of her diabetes.  She states that she has been checking her blood sugars and reports that they have been ranging from 180's to 200.  She notes that she has been snacking more at work.  She reports that she has been more stressed and continues to smoke cigarettes.  She did not take her BP medication this morning due to running late.  She has increased to smoking 1 ppd from .5 ppd.  She has been attending nutrition classes but has not lost weight.     Allergies  Allergen Reactions  . Compazine [Prochlorperazine] Shortness Of Breath and Swelling    TONGUE SWELLS  . Omnipaque [Iohexol] Hives  . Shellfish-Derived Products Anaphylaxis  . Iodinated Diagnostic Agents Rash   Past Medical History  Diagnosis Date  . Diabetes mellitus   . Hypertension    Current Outpatient Prescriptions on File Prior to Visit  Medication Sig Dispense Refill  . aspirin EC 81 MG tablet Take 1 tablet (81 mg total) by mouth daily as needed for pain.  30 tablet  3  . Blood Glucose Monitoring Suppl (ON CALL EXPRESS GLUCOSE METER) DEVI Use as directed  1 Device  0  . gabapentin (NEURONTIN) 300 MG capsule Take 1 capsule (300 mg total) by mouth 2 (two) times daily.  60 capsule  3  . glipiZIDE (GLUCOTROL) 5 MG tablet Take 1 tablet (5 mg total) by mouth 2 (two) times daily before a meal.  60 tablet  3  . glucose blood test strip Use as instructed  100 each  12  . hydrALAZINE (APRESOLINE) 25 MG tablet Take 1 tablet (25 mg total) by mouth 3 (three) times daily.  60 tablet  3  . lisinopril-hydrochlorothiazide (PRINZIDE,ZESTORETIC) 20-25 MG per tablet Take 1 tablet by mouth daily.  60 tablet  3  . PARoxetine (PAXIL) 10 MG tablet Take 1 tablet (10 mg total) by mouth daily.  30 tablet  2  . TRUEPLUS LANCETS 30G MISC Use as directed  100 each  2  . amoxicillin-clavulanate (AUGMENTIN) 875-125 MG per  tablet Take 1 tablet by mouth 2 (two) times daily. X 10days  20 tablet  0  . metFORMIN (GLUCOPHAGE) 500 MG tablet Take 1 tablet (500 mg total) by mouth 2 (two) times daily with a meal.  60 tablet  3   No current facility-administered medications on file prior to visit.   Family History  Problem Relation Age of Onset  . Cancer Mother   . Heart disease Father    History   Social History  . Marital Status: Single    Spouse Name: N/A    Number of Children: N/A  . Years of Education: N/A   Occupational History  . Not on file.   Social History Main Topics  . Smoking status: Current Every Day Smoker -- 0.50 packs/day  . Smokeless tobacco: Never Used  . Alcohol Use: Yes     Comment: ocassionally  . Drug Use: No  . Sexual Activity: No   Other Topics Concern  . Not on file   Social History Narrative  . No narrative on file    Review of Systems: Constitutional: Negative for fever, chills, diaphoresis, activity change, appetite change and fatigue. HENT: Negative for ear pain, nosebleeds, congestion, facial swelling, rhinorrhea, neck pain, neck stiffness and ear discharge.  Eyes: Negative  for pain, discharge, redness, itching and visual disturbance. Respiratory: Negative for cough, choking, chest tightness, shortness of breath, wheezing and stridor.  Cardiovascular: Negative for chest pain, palpitations and leg swelling. Gastrointestinal: Negative for abdominal distention. Genitourinary: Negative for dysuria, urgency, frequency, hematuria, flank pain, decreased urine volume, difficulty urinating and dyspareunia.  Musculoskeletal: Negative for back pain, joint swelling, arthralgias and gait problem. Neurological: Negative for dizziness, tremors, seizures, syncope, facial asymmetry, speech difficulty, weakness, light-headedness, numbness and headaches.  Hematological: Negative for adenopathy. Does not bruise/bleed easily. Psychiatric/Behavioral: Negative for hallucinations, behavioral  problems, confusion, dysphoric mood, decreased concentration and agitation.    Objective:   Filed Vitals:   02/27/14 1112  BP: 181/94  Pulse: 103  Temp: 98.9 F (37.2 C)  Resp: 16    Physical Exam: Constitutional: Patient appears well-developed and well-nourished. No distress. Eyes: Conjunctivae and EOM are normal. PERRLA, no scleral icterus. Neck: Normal ROM. Neck supple. No JVD. No tracheal deviation. No thyromegaly. CVS: RRR, S1/S2 +, no murmurs, no gallops, no carotid bruit.  Pulmonary: Effort and breath sounds normal, no stridor, rhonchi, wheezes, rales.  Abdominal: Soft. BS +,  no distension, tenderness, rebound or guarding.  Musculoskeletal: Normal range of motion. No edema and no tenderness.  Neuro: Alert. Normal reflexes Skin: Skin is warm and dry. Not diaphoretic. No erythema. Psychiatric: Normal mood and affect. Behavior, judgment, thought content normal.  Lab Results  Component Value Date   WBC 6.8 02/21/2013   HGB 14.6 02/21/2013   HCT 41.5 02/21/2013   MCV 92.0 02/21/2013   PLT 304 02/21/2013   Lab Results  Component Value Date   CREATININE 1.26* 12/15/2013   BUN 28* 12/15/2013   NA 138 12/15/2013   K 4.3 12/15/2013   CL 98 12/15/2013   CO2 32 12/15/2013    Lab Results  Component Value Date   HGBA1C 7.3 12/15/2013   Lipid Panel     Component Value Date/Time   CHOL 163 02/21/2013 1051   TRIG 150* 02/21/2013 1051   HDL 73 02/21/2013 1051   CHOLHDL 2.2 02/21/2013 1051   VLDL 30 02/21/2013 1051   LDLCALC 60 02/21/2013 1051       Assessment and plan:   Jhovana was seen today for follow-up.  Diagnoses and associated orders for this visit:  Type 2 diabetes mellitus without complication - Glucose (CBG) - Increased dose to glipiZIDE (GLUCOTROL) 10 MG tablet; Take 1 tablet (10 mg total) by mouth 2 (two) times daily before a meal. Patient is non-compliant with medication regimen Essential hypertension - hydrALAZINE (APRESOLINE) 25 MG tablet; Take 1 tablet (25 mg total) by mouth 3  (three) times daily. - Added amLODipine (NORVASC) 10 MG tablet; Take 1 tablet (10 mg total) by mouth daily It is in my opinion the patient is non compliant with medication regimen. Elevated serum creatinine - COMPLETE METABOLIC PANEL WITH GFR  Refused influenza vaccine Explained benefits of vaccination to patient  Return in about 6 weeks (around 04/10/2014) for DM and repeat a1c.]    Chari Manning, [facility] [phone] 03/13/2014, 6:07 PM

## 2014-02-27 NOTE — Progress Notes (Signed)
Pt is here to follow up on her HTN and Diabetes.

## 2014-02-27 NOTE — Patient Instructions (Signed)
Stop taking lisinopril-hydrocholorathiazide  Begin taking amlodipine once daily may continue taking hydralizine three times per day Increased glipizide to 10 mg twice per day

## 2014-06-05 ENCOUNTER — Ambulatory Visit: Payer: Self-pay

## 2014-06-16 DIAGNOSIS — B1920 Unspecified viral hepatitis C without hepatic coma: Secondary | ICD-10-CM

## 2014-06-16 HISTORY — DX: Unspecified viral hepatitis C without hepatic coma: B19.20

## 2014-07-10 ENCOUNTER — Other Ambulatory Visit: Payer: Self-pay | Admitting: Internal Medicine

## 2014-08-02 ENCOUNTER — Ambulatory Visit: Payer: Self-pay | Attending: Internal Medicine

## 2014-08-07 ENCOUNTER — Emergency Department (HOSPITAL_COMMUNITY): Payer: Medicaid Other

## 2014-08-07 ENCOUNTER — Encounter (HOSPITAL_COMMUNITY): Payer: Self-pay | Admitting: *Deleted

## 2014-08-07 ENCOUNTER — Inpatient Hospital Stay (HOSPITAL_COMMUNITY)
Admission: EM | Admit: 2014-08-07 | Discharge: 2014-09-14 | DRG: 853 | Disposition: A | Discharge status: Rehabilitation Facility | Payer: Medicaid Other | Attending: Internal Medicine | Admitting: Internal Medicine

## 2014-08-07 DIAGNOSIS — M00062 Staphylococcal arthritis, left knee: Secondary | ICD-10-CM | POA: Diagnosis present

## 2014-08-07 DIAGNOSIS — R05 Cough: Secondary | ICD-10-CM

## 2014-08-07 DIAGNOSIS — B1920 Unspecified viral hepatitis C without hepatic coma: Secondary | ICD-10-CM

## 2014-08-07 DIAGNOSIS — Z79899 Other long term (current) drug therapy: Secondary | ICD-10-CM

## 2014-08-07 DIAGNOSIS — N170 Acute kidney failure with tubular necrosis: Secondary | ICD-10-CM | POA: Diagnosis not present

## 2014-08-07 DIAGNOSIS — M65841 Other synovitis and tenosynovitis, right hand: Secondary | ICD-10-CM | POA: Diagnosis present

## 2014-08-07 DIAGNOSIS — R21 Rash and other nonspecific skin eruption: Secondary | ICD-10-CM | POA: Diagnosis not present

## 2014-08-07 DIAGNOSIS — M009 Pyogenic arthritis, unspecified: Secondary | ICD-10-CM | POA: Diagnosis present

## 2014-08-07 DIAGNOSIS — R29810 Facial weakness: Secondary | ICD-10-CM

## 2014-08-07 DIAGNOSIS — Z992 Dependence on renal dialysis: Secondary | ICD-10-CM

## 2014-08-07 DIAGNOSIS — G629 Polyneuropathy, unspecified: Secondary | ICD-10-CM | POA: Diagnosis present

## 2014-08-07 DIAGNOSIS — B9561 Methicillin susceptible Staphylococcus aureus infection as the cause of diseases classified elsewhere: Secondary | ICD-10-CM

## 2014-08-07 DIAGNOSIS — R34 Anuria and oliguria: Secondary | ICD-10-CM | POA: Diagnosis not present

## 2014-08-07 DIAGNOSIS — R509 Fever, unspecified: Secondary | ICD-10-CM

## 2014-08-07 DIAGNOSIS — N12 Tubulo-interstitial nephritis, not specified as acute or chronic: Secondary | ICD-10-CM | POA: Diagnosis not present

## 2014-08-07 DIAGNOSIS — Z888 Allergy status to other drugs, medicaments and biological substances status: Secondary | ICD-10-CM

## 2014-08-07 DIAGNOSIS — E1121 Type 2 diabetes mellitus with diabetic nephropathy: Secondary | ICD-10-CM | POA: Diagnosis present

## 2014-08-07 DIAGNOSIS — I120 Hypertensive chronic kidney disease with stage 5 chronic kidney disease or end stage renal disease: Secondary | ICD-10-CM | POA: Diagnosis present

## 2014-08-07 DIAGNOSIS — N179 Acute kidney failure, unspecified: Secondary | ICD-10-CM | POA: Insufficient documentation

## 2014-08-07 DIAGNOSIS — Z6834 Body mass index (BMI) 34.0-34.9, adult: Secondary | ICD-10-CM

## 2014-08-07 DIAGNOSIS — R791 Abnormal coagulation profile: Secondary | ICD-10-CM | POA: Diagnosis not present

## 2014-08-07 DIAGNOSIS — Z91041 Radiographic dye allergy status: Secondary | ICD-10-CM

## 2014-08-07 DIAGNOSIS — Z91013 Allergy to seafood: Secondary | ICD-10-CM

## 2014-08-07 DIAGNOSIS — F17210 Nicotine dependence, cigarettes, uncomplicated: Secondary | ICD-10-CM | POA: Diagnosis present

## 2014-08-07 DIAGNOSIS — B182 Chronic viral hepatitis C: Secondary | ICD-10-CM | POA: Diagnosis present

## 2014-08-07 DIAGNOSIS — D509 Iron deficiency anemia, unspecified: Secondary | ICD-10-CM | POA: Diagnosis present

## 2014-08-07 DIAGNOSIS — Z809 Family history of malignant neoplasm, unspecified: Secondary | ICD-10-CM

## 2014-08-07 DIAGNOSIS — M00011 Staphylococcal arthritis, right shoulder: Secondary | ICD-10-CM | POA: Diagnosis present

## 2014-08-07 DIAGNOSIS — A4101 Sepsis due to Methicillin susceptible Staphylococcus aureus: Principal | ICD-10-CM | POA: Diagnosis present

## 2014-08-07 DIAGNOSIS — D62 Acute posthemorrhagic anemia: Secondary | ICD-10-CM | POA: Diagnosis not present

## 2014-08-07 DIAGNOSIS — M00012 Staphylococcal arthritis, left shoulder: Secondary | ICD-10-CM | POA: Diagnosis present

## 2014-08-07 DIAGNOSIS — M7989 Other specified soft tissue disorders: Secondary | ICD-10-CM

## 2014-08-07 DIAGNOSIS — Z7982 Long term (current) use of aspirin: Secondary | ICD-10-CM

## 2014-08-07 DIAGNOSIS — R6510 Systemic inflammatory response syndrome (SIRS) of non-infectious origin without acute organ dysfunction: Secondary | ICD-10-CM | POA: Insufficient documentation

## 2014-08-07 DIAGNOSIS — E871 Hypo-osmolality and hyponatremia: Secondary | ICD-10-CM | POA: Diagnosis present

## 2014-08-07 DIAGNOSIS — R319 Hematuria, unspecified: Secondary | ICD-10-CM | POA: Diagnosis not present

## 2014-08-07 DIAGNOSIS — N049 Nephrotic syndrome with unspecified morphologic changes: Secondary | ICD-10-CM | POA: Diagnosis not present

## 2014-08-07 DIAGNOSIS — N186 End stage renal disease: Secondary | ICD-10-CM | POA: Diagnosis present

## 2014-08-07 DIAGNOSIS — Z8249 Family history of ischemic heart disease and other diseases of the circulatory system: Secondary | ICD-10-CM

## 2014-08-07 DIAGNOSIS — R000 Tachycardia, unspecified: Secondary | ICD-10-CM | POA: Insufficient documentation

## 2014-08-07 DIAGNOSIS — E8770 Fluid overload, unspecified: Secondary | ICD-10-CM | POA: Diagnosis not present

## 2014-08-07 DIAGNOSIS — M00061 Staphylococcal arthritis, right knee: Secondary | ICD-10-CM | POA: Diagnosis present

## 2014-08-07 DIAGNOSIS — R059 Cough, unspecified: Secondary | ICD-10-CM

## 2014-08-07 DIAGNOSIS — M609 Myositis, unspecified: Secondary | ICD-10-CM | POA: Diagnosis present

## 2014-08-07 DIAGNOSIS — E1142 Type 2 diabetes mellitus with diabetic polyneuropathy: Secondary | ICD-10-CM | POA: Diagnosis present

## 2014-08-07 DIAGNOSIS — E1165 Type 2 diabetes mellitus with hyperglycemia: Secondary | ICD-10-CM | POA: Diagnosis present

## 2014-08-07 DIAGNOSIS — M25519 Pain in unspecified shoulder: Secondary | ICD-10-CM

## 2014-08-07 DIAGNOSIS — R7881 Bacteremia: Secondary | ICD-10-CM | POA: Diagnosis present

## 2014-08-07 DIAGNOSIS — R112 Nausea with vomiting, unspecified: Secondary | ICD-10-CM

## 2014-08-07 DIAGNOSIS — K5900 Constipation, unspecified: Secondary | ICD-10-CM

## 2014-08-07 HISTORY — DX: Polyneuropathy, unspecified: G62.9

## 2014-08-07 LAB — GLUCOSE, CAPILLARY: Glucose-Capillary: 210 mg/dL — ABNORMAL HIGH (ref 70–99)

## 2014-08-07 LAB — BASIC METABOLIC PANEL
Anion gap: 8 (ref 5–15)
BUN: 24 mg/dL — ABNORMAL HIGH (ref 6–23)
CO2: 27 mmol/L (ref 19–32)
Calcium: 8.4 mg/dL (ref 8.4–10.5)
Chloride: 96 mmol/L (ref 96–112)
Creatinine, Ser: 1.66 mg/dL — ABNORMAL HIGH (ref 0.50–1.10)
GFR calc Af Amer: 38 mL/min — ABNORMAL LOW (ref >90)
GFR calc non Af Amer: 33 mL/min — ABNORMAL LOW (ref >90)
Glucose, Bld: 186 mg/dL — ABNORMAL HIGH (ref 70–99)
Potassium: 3.7 mmol/L (ref 3.5–5.1)
Sodium: 131 mmol/L — ABNORMAL LOW (ref 135–145)

## 2014-08-07 LAB — CBC WITH DIFFERENTIAL/PLATELET
Basophils Absolute: 0 10*3/uL (ref 0.0–0.1)
Basophils Relative: 0 % (ref 0–1)
Eosinophils Absolute: 0 10*3/uL (ref 0.0–0.7)
Eosinophils Relative: 0 % (ref 0–5)
HCT: 34.7 % — ABNORMAL LOW (ref 36.0–46.0)
Hemoglobin: 11.6 g/dL — ABNORMAL LOW (ref 12.0–15.0)
Lymphocytes Relative: 4 % — ABNORMAL LOW (ref 12–46)
Lymphs Abs: 0.4 10*3/uL — ABNORMAL LOW (ref 0.7–4.0)
MCH: 31.4 pg (ref 26.0–34.0)
MCHC: 33.4 g/dL (ref 30.0–36.0)
MCV: 94 fL (ref 78.0–100.0)
Monocytes Absolute: 0.4 10*3/uL (ref 0.1–1.0)
Monocytes Relative: 4 % (ref 3–12)
Neutro Abs: 10.6 10*3/uL — ABNORMAL HIGH (ref 1.7–7.7)
Neutrophils Relative %: 92 % — ABNORMAL HIGH (ref 43–77)
Platelets: 201 10*3/uL (ref 150–400)
RBC: 3.69 MIL/uL — ABNORMAL LOW (ref 3.87–5.11)
RDW: 13.1 % (ref 11.5–15.5)
WBC: 11.5 10*3/uL — ABNORMAL HIGH (ref 4.0–10.5)

## 2014-08-07 LAB — PROTIME-INR
INR: 1.11 (ref 0.00–1.49)
Prothrombin Time: 14.4 seconds (ref 11.6–15.2)

## 2014-08-07 LAB — APTT: aPTT: 35 seconds (ref 24–37)

## 2014-08-07 LAB — D-DIMER, QUANTITATIVE: D-Dimer, Quant: 2.55 ug/mL-FEU — ABNORMAL HIGH (ref 0.00–0.48)

## 2014-08-07 LAB — I-STAT TROPONIN, ED
Troponin i, poc: 0.09 ng/mL (ref 0.00–0.08)
Troponin i, poc: 0.04 ng/mL (ref 0.00–0.08)

## 2014-08-07 LAB — RETICULOCYTES
RBC.: 3.77 MIL/uL — ABNORMAL LOW (ref 3.87–5.11)
Retic Count, Absolute: 98 10*3/uL (ref 19.0–186.0)
Retic Ct Pct: 2.6 % (ref 0.4–3.1)

## 2014-08-07 MED ORDER — MORPHINE SULFATE 4 MG/ML IJ SOLN
4.0000 mg | Freq: Once | INTRAMUSCULAR | Status: AC
  Administered 2014-08-07: 4 mg via INTRAMUSCULAR
  Filled 2014-08-07: qty 1

## 2014-08-07 MED ORDER — SODIUM CHLORIDE 0.9 % IJ SOLN
3.0000 mL | Freq: Two times a day (BID) | INTRAMUSCULAR | Status: DC
  Administered 2014-08-08 – 2014-09-12 (×22): 3 mL via INTRAVENOUS

## 2014-08-07 MED ORDER — ENOXAPARIN SODIUM 40 MG/0.4ML ~~LOC~~ SOLN: 40.0000 mg | SUBCUTANEOUS | Status: DC

## 2014-08-07 MED ORDER — SODIUM CHLORIDE 0.9 % IV SOLN
INTRAVENOUS | Status: AC
  Administered 2014-08-07: 21:00:00 via INTRAVENOUS

## 2014-08-07 MED ORDER — ASPIRIN 81 MG PO CHEW
324.0000 mg | CHEWABLE_TABLET | ORAL | Status: AC
  Administered 2014-08-07: 324 mg via ORAL
  Filled 2014-08-07: qty 4

## 2014-08-07 MED ORDER — GLIPIZIDE 10 MG PO TABS
10.0000 mg | ORAL_TABLET | Freq: Two times a day (BID) | ORAL | Status: DC
  Administered 2014-08-08 – 2014-08-11 (×7): 10 mg via ORAL
  Filled 2014-08-07 (×12): qty 1

## 2014-08-07 MED ORDER — HYDROCODONE-ACETAMINOPHEN 5-325 MG PO TABS
1.0000 | ORAL_TABLET | ORAL | Status: DC | PRN
  Administered 2014-08-08 – 2014-08-09 (×5): 2 via ORAL
  Administered 2014-08-09: 1 via ORAL
  Administered 2014-08-09 – 2014-08-31 (×32): 2 via ORAL
  Administered 2014-09-01 (×3): 1 via ORAL
  Administered 2014-09-02 – 2014-09-04 (×5): 2 via ORAL
  Administered 2014-09-05: 1 via ORAL
  Administered 2014-09-08 – 2014-09-13 (×3): 2 via ORAL
  Filled 2014-08-07 (×12): qty 2
  Filled 2014-08-07 (×2): qty 1
  Filled 2014-08-07 (×15): qty 2
  Filled 2014-08-07: qty 1
  Filled 2014-08-07 (×12): qty 2
  Filled 2014-08-07: qty 1
  Filled 2014-08-07 (×15): qty 2

## 2014-08-07 MED ORDER — DIPHENHYDRAMINE HCL 50 MG/ML IJ SOLN
50.0000 mg | Freq: Once | INTRAMUSCULAR | Status: AC
  Administered 2014-08-07: 50 mg via INTRAVENOUS
  Filled 2014-08-07: qty 1

## 2014-08-07 MED ORDER — OXYCODONE-ACETAMINOPHEN 5-325 MG PO TABS
1.0000 | ORAL_TABLET | Freq: Once | ORAL | Status: AC
  Administered 2014-08-07: 1 via ORAL
  Filled 2014-08-07: qty 1

## 2014-08-07 MED ORDER — ASPIRIN 81 MG PO CHEW
81.0000 mg | CHEWABLE_TABLET | Freq: Every day | ORAL | Status: DC
  Administered 2014-08-08 – 2014-09-14 (×35): 81 mg via ORAL
  Filled 2014-08-07 (×36): qty 1

## 2014-08-07 MED ORDER — IOHEXOL 350 MG/ML SOLN
100.0000 mL | Freq: Once | INTRAVENOUS | Status: AC | PRN
  Administered 2014-08-07: 100 mL via INTRAVENOUS

## 2014-08-07 MED ORDER — CYCLOBENZAPRINE HCL 5 MG PO TABS
5.0000 mg | ORAL_TABLET | Freq: Two times a day (BID) | ORAL | Status: DC
  Administered 2014-08-08 – 2014-09-13 (×74): 5 mg via ORAL
  Filled 2014-08-07 (×80): qty 1

## 2014-08-07 MED ORDER — AMLODIPINE BESYLATE 10 MG PO TABS
10.0000 mg | ORAL_TABLET | ORAL | Status: AC
  Administered 2014-08-07: 10 mg via ORAL
  Filled 2014-08-07: qty 1

## 2014-08-07 MED ORDER — HYDROMORPHONE HCL 1 MG/ML IJ SOLN
0.5000 mg | INTRAMUSCULAR | Status: DC | PRN
  Administered 2014-08-07 – 2014-08-18 (×37): 0.5 mg via INTRAVENOUS
  Filled 2014-08-07 (×39): qty 1

## 2014-08-07 MED ORDER — METHYLPREDNISOLONE SODIUM SUCC 40 MG IJ SOLR
40.0000 mg | Freq: Once | INTRAMUSCULAR | Status: AC
  Administered 2014-08-07: 40 mg via INTRAVENOUS
  Filled 2014-08-07: qty 1

## 2014-08-07 MED ORDER — SODIUM CHLORIDE 0.9 % IV BOLUS (SEPSIS)
1000.0000 mL | Freq: Once | INTRAVENOUS | Status: AC
  Administered 2014-08-07: 1000 mL via INTRAVENOUS

## 2014-08-07 MED ORDER — INFLUENZA VAC SPLIT QUAD 0.5 ML IM SUSY
0.5000 mL | PREFILLED_SYRINGE | INTRAMUSCULAR | Status: AC
  Administered 2014-08-08: 0.5 mL via INTRAMUSCULAR
  Filled 2014-08-07 (×2): qty 0.5

## 2014-08-07 MED ORDER — MORPHINE SULFATE 4 MG/ML IJ SOLN
4.0000 mg | Freq: Once | INTRAMUSCULAR | Status: AC
  Administered 2014-08-07: 4 mg via INTRAVENOUS
  Filled 2014-08-07: qty 1

## 2014-08-07 MED ORDER — HYDRALAZINE HCL 25 MG PO TABS
25.0000 mg | ORAL_TABLET | Freq: Three times a day (TID) | ORAL | Status: DC
  Administered 2014-08-08 – 2014-09-11 (×92): 25 mg via ORAL
  Filled 2014-08-07 (×109): qty 1

## 2014-08-07 MED ORDER — INSULIN ASPART 100 UNIT/ML ~~LOC~~ SOLN
0.0000 [IU] | Freq: Three times a day (TID) | SUBCUTANEOUS | Status: DC
  Administered 2014-08-08: 5 [IU] via SUBCUTANEOUS
  Administered 2014-08-08 (×2): 2 [IU] via SUBCUTANEOUS
  Administered 2014-08-09: 5 [IU] via SUBCUTANEOUS
  Administered 2014-08-09: 3 [IU] via SUBCUTANEOUS
  Administered 2014-08-09 – 2014-08-12 (×3): 2 [IU] via SUBCUTANEOUS
  Administered 2014-08-12: 1 [IU] via SUBCUTANEOUS
  Administered 2014-08-13 (×2): 2 [IU] via SUBCUTANEOUS
  Administered 2014-08-13: 3 [IU] via SUBCUTANEOUS
  Administered 2014-08-14 (×2): 2 [IU] via SUBCUTANEOUS
  Administered 2014-08-14 – 2014-08-15 (×2): 3 [IU] via SUBCUTANEOUS
  Administered 2014-08-16: 2 [IU] via SUBCUTANEOUS
  Administered 2014-08-16: 3 [IU] via SUBCUTANEOUS
  Administered 2014-08-16: 2 [IU] via SUBCUTANEOUS
  Administered 2014-08-17 – 2014-08-23 (×10): 1 [IU] via SUBCUTANEOUS
  Administered 2014-08-24: 2 [IU] via SUBCUTANEOUS
  Administered 2014-08-24 – 2014-08-25 (×2): 1 [IU] via SUBCUTANEOUS
  Administered 2014-08-25: 2 [IU] via SUBCUTANEOUS
  Administered 2014-08-27 – 2014-08-29 (×3): 1 [IU] via SUBCUTANEOUS
  Administered 2014-08-31: 2 [IU] via SUBCUTANEOUS
  Administered 2014-08-31: 1 [IU] via SUBCUTANEOUS
  Administered 2014-09-01 (×2): 2 [IU] via SUBCUTANEOUS
  Administered 2014-09-03: 1 [IU] via SUBCUTANEOUS
  Administered 2014-09-03: 2 [IU] via SUBCUTANEOUS
  Administered 2014-09-03: 1 [IU] via SUBCUTANEOUS
  Administered 2014-09-04: 2 [IU] via SUBCUTANEOUS
  Administered 2014-09-05: 1 [IU] via SUBCUTANEOUS
  Administered 2014-09-05: 2 [IU] via SUBCUTANEOUS
  Administered 2014-09-06 – 2014-09-08 (×4): 1 [IU] via SUBCUTANEOUS
  Administered 2014-09-08: 2 [IU] via SUBCUTANEOUS
  Administered 2014-09-09 (×2): 1 [IU] via SUBCUTANEOUS
  Administered 2014-09-09: 2 [IU] via SUBCUTANEOUS
  Administered 2014-09-10 – 2014-09-12 (×4): 1 [IU] via SUBCUTANEOUS

## 2014-08-07 MED ORDER — FAMOTIDINE 20 MG PO TABS
20.0000 mg | ORAL_TABLET | Freq: Two times a day (BID) | ORAL | Status: DC
  Administered 2014-08-08 – 2014-08-15 (×15): 20 mg via ORAL
  Filled 2014-08-07 (×17): qty 1

## 2014-08-07 MED ORDER — PNEUMOCOCCAL VAC POLYVALENT 25 MCG/0.5ML IJ INJ
0.5000 mL | INJECTION | INTRAMUSCULAR | Status: AC
  Administered 2014-08-08: 0.5 mL via INTRAMUSCULAR
  Filled 2014-08-07 (×2): qty 0.5

## 2014-08-07 NOTE — ED Provider Notes (Signed)
CSN: [id number]     Arrival date & time 08/07/14  1006 History   First MD Initiated Contact with Patient 08/07/14 1049     Chief Complaint  Patient presents with  . Shoulder Pain     (Consider location/radiation/quality/duration/timing/severity/associated sxs/prior Treatment) HPI  Brandy Houston is a 59 y.o. female with PMH of diabetes, hypertension presenting with left shoulder pain. 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 Patient denies any numbness, tingling or weakness. The pain is severe and she is unable to lift her left arm. She is only taking Tylenol and ibuprofen. Patient has never injured this arm before. No surgeries. Patient also with tachycardia. She denies any chest pain, shortness of breath. She does not have a cardiac history but does have history of diabetes and hypertension. She reports taking her hydralazine but not Norvasc. She states her sugars have been 170s.   Past Medical History  Diagnosis Date  . Diabetes mellitus   . Hypertension    Past Surgical History  Procedure Laterality Date  . Cholecystectomy    . Appendectomy    . Small intestine surgery    . Tubal ligation     Family History  Problem Relation Age of Onset  . Cancer Mother   . Heart disease Father    History  Substance Use Topics  . Smoking status: Current Every Day Smoker -- 0.50 packs/day for 20 years  . Smokeless tobacco: Never Used  . Alcohol Use: Yes     Comment: ocassionally   OB History    No data available     Review of Systems 10 Systems reviewed and are negative for acute change except as noted in the HPI.    Allergies  Compazine; Omnipaque; Shellfish-derived products; and Iodinated diagnostic agents  Home Medications   Prior to Admission medications   Medication Sig Start Date End Date Taking? Authorizing Provider  amLODipine (NORVASC) 10 MG tablet Take 1 tablet (10 mg total) by mouth daily.  02/27/14  Yes Lance Bosch, NP  aspirin 81 MG chewable tablet Chew 81-162 mg by mouth daily as needed (For stroke prevention.).    Yes Historical Provider, MD  Blood Glucose Monitoring Suppl (ON CALL EXPRESS GLUCOSE METER) DEVI Use as directed 01/25/14  Yes Lance Bosch, NP  glipiZIDE (GLUCOTROL) 10 MG tablet Take 1 tablet (10 mg total) by mouth 2 (two) times daily before a meal. 02/27/14  Yes Lance Bosch, NP  glucose blood test strip Use as instructed 01/25/14  Yes Lance Bosch, NP  hydrALAZINE (APRESOLINE) 25 MG tablet Take 1 tablet (25 mg total) by mouth 3 (three) times daily. 02/27/14  Yes Lance Bosch, NP  Menthol-Methyl Salicylate (MUSCLE RUB) 10-15 % CREA Apply 1 application topically daily as needed for muscle pain.   Yes Historical Provider, MD  TRUEPLUS LANCETS 30G MISC Use as directed 01/25/14  Yes Lance Bosch, NP  amoxicillin-clavulanate (AUGMENTIN) 875-125 MG per tablet Take 1 tablet by mouth 2 (two) times daily. X 10days Patient not taking: Reported on 08/07/2014 03/17/13   Ripudeep Krystal Eaton, MD  aspirin EC 81 MG tablet Take 1 tablet (81 mg total) by mouth daily as needed for pain. 04/14/13   Shanker Kristeen Mans, MD  gabapentin (NEURONTIN) 300 MG capsule Take 1 capsule (300 mg total) by mouth 2 (two) times daily. Patient not taking: Reported on 08/07/2014 12/15/13   Lance Bosch, NP  PARoxetine (PAXIL) 10 MG tablet Take 1 tablet (10 mg total) by mouth daily. Patient not taking: Reported on 08/07/2014 12/15/13   Lance Bosch, NP   BP 145/79 mmHg  Pulse 114  Temp(Src) 98.3 F (36.8 C) (Oral)  Resp 13  SpO2 99%  LMP 03/16/2002 Physical Exam  Constitutional: She appears well-developed and well-nourished. No distress.  HENT:  Head: Normocephalic and atraumatic.  Eyes: Conjunctivae and EOM are normal. Right eye exhibits no discharge. Left eye exhibits no discharge.  Cardiovascular: Normal rate and regular rhythm.   2+ radial pulses equal bilaterally. Less than 3 second cap refill.   Pulmonary/Chest: Effort normal and breath sounds normal. No respiratory distress. She has no wheezes.  Abdominal: Soft. Bowel sounds are normal. She exhibits no distension. There is no tenderness.  Musculoskeletal:  Tenderness of the anterior left shoulder worse with range of motion. No warmth, erythema, edema shoulder. No overt deformity. No tenderness to clavicle or step off or crepitus. FROM left elbow, wrist and fingers.  Neurological: She is alert. She exhibits normal muscle tone. Coordination normal.  Strength limited due to pain but intact in lower extremity. Sensation intact.  Skin: Skin is warm and dry. She is not diaphoretic.  Nursing note and vitals reviewed.   ED Course  Procedures (including critical care time) Labs Review Labs Reviewed  CBC WITH DIFFERENTIAL/PLATELET - Abnormal; Notable for the following:    WBC 11.5 (*)    RBC 3.69 (*)    Hemoglobin 11.6 (*)    HCT 34.7 (*)    Neutrophils Relative % 92 (*)    Neutro Abs 10.6 (*)    Lymphocytes Relative 4 (*)    Lymphs Abs 0.4 (*)    All other components within normal limits  BASIC METABOLIC PANEL - Abnormal; Notable for the following:    Sodium 131 (*)    Glucose, Bld 186 (*)    BUN 24 (*)    Creatinine, Ser 1.66 (*)    GFR calc non Af Amer 33 (*)    GFR calc Af Amer 38 (*)    All other components within normal limits  D-DIMER, QUANTITATIVE - Abnormal; Notable for the following:    D-Dimer, Quant 2.55 (*)    All other components within normal limits    Imaging Review Dg Chest 2 View  08/07/2014   CLINICAL DATA:  Left shoulder pain for 5 days. No known injury. Initial encounter.  EXAM: CHEST  2 VIEW  COMPARISON:  None.  FINDINGS: The heart size and mediastinal contours are within normal limits. Both lungs are clear. No pneumothorax or pleural effusion is noted. The visualized skeletal structures are unremarkable.  IMPRESSION: No acute cardiopulmonary abnormality seen.   Electronically Signed   By: Marijo Conception, M.D.   On: 08/07/2014 15:02   Dg Shoulder Left  08/07/2014   CLINICAL DATA:  59 year old female with left shoulder pain after have the lifting injury on 08/03/2014. Initial encounter.  EXAM: LEFT SHOULDER - 2+ VIEW  COMPARISON:  None.  FINDINGS: Bone mineralization is within normal limits for age. No glenohumeral joint dislocation. Proximal left humerus intact. Left clavicle and scapula appear intact. Negative visible left ribs and lung parenchyma.  IMPRESSION: No acute osseous abnormality identified at the left shoulder.   Electronically Signed   By: Genevie Ann M.D.   On: 08/07/2014 11:11     EKG Interpretation   Date/Time:  Monday August 07 2014 10:19:49 EST Ventricular Rate:  126 PR Interval:  130 QRS Duration: 91 QT Interval:  306 QTC Calculation: 443 R Axis:   52 Text Interpretation:  Sinus tachycardia Probable left atrial enlargement  Anteroseptal infarct, old Nonspecific T abnormalities, lateral leads No  significant change since last tracing Confirmed by [facility]  MD, MARTHA  [phone]) on 08/07/2014 12:42:19 PM      MDM   Final diagnoses:  None   Patient presenting with left shoulder pain after heavy lifting. Neurovascularly intact. X-ray without acute abnormality. Patient with tachycardia. Given pain medicine as well as fluids and home hypertension medication. She denies any chest pain or shortness of breath. Pt with desaturations while asleep after morphine administration these have resolved.Improvement in BP but Tachycardia persistent.  Lab work ordered and patient with elevated d-dimer. Normal chest x-ray. EKG without acute abnormalities. CT angio ordered to rule out PE. Troponin, coagulation studies pending. Pt with allergic reaction to contrast. Called radiology for premedication regimen.   Pt signed out to [employer] PA-C  Plan: CT angio pending. If negative pt can be discharged home with pain medications with ortho and PCP follow up.     Pura Spice,  PA-C 08/07/14 [place], MD 08/07/14 [phone]

## 2014-08-07 NOTE — ED Notes (Signed)
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 Radial pulse strong, able to wiggle fingers, reports difficulty lifting arm. Pain 8/10. Denies chest pain.

## 2014-08-07 NOTE — ED Notes (Signed)
Dr Alvino Chapel made aware of abnormal troponin

## 2014-08-07 NOTE — ED Provider Notes (Signed)
4:05 PM: At end of shift, hand-off report received from Doyce Loose, [place].  Plan includes pre-treatment for contrast allergy and CT angio chest for PE.  Pt resting without distress.  4:30 PM: Troponin resulted: 0.09.  Pt currently in CT.  5:00 PM: on re-eval, pt reports shoulder pain is returning and rates 8/10. Heart rate on monitor noted at 118 and O2 sat on room air 89-90% with good wave form.  Pt placed on 2 L [place]. Morphine 4 mg ordered.   5:30 PM: CT Resulted: mild interstitial edema, but no PE.  EKG, Troponin repeated.  6:00 PM: Repeat troponin down to 0.04.  Pt's pain has improved after the morphine, but remains tachycardic to 120. Will admit to medicine.   7:10 PM: Consulted Dr. Eulas Post (hospitalist) regarding pt's status, pt accepted to telemetry for evaluation of persistent tachycardia. The patient appears reasonably stabilized for admission considering the current resources, flow, and capabilities available in the ED at this time, and I doubt any further screening and/or treatment in the ED prior to admission.     Filed Vitals:   08/07/14 1630 08/07/14 1645 08/07/14 1715 08/07/14 1730  BP: 161/84     Pulse: 114 111 119 118  Temp:      TempSrc:      Resp: 14 16 13 15   SpO2:   97% 98%   Meds given in ED:  Medications  pneumococcal 23 valent vaccine (PNU-IMMUNE) injection 0.5 mL (not administered)  Influenza vac split quadrivalent PF (FLUARIX) injection 0.5 mL (not administered)  oxyCODONE-acetaminophen (PERCOCET/ROXICET) 5-325 MG per tablet 1 tablet (1 tablet Oral Given 08/07/14 1120)  amLODipine (NORVASC) tablet 10 mg (10 mg Oral Given 08/07/14 1148)  morphine 4 MG/ML injection 4 mg (4 mg Intramuscular Given 08/07/14 1148)  sodium chloride 0.9 % bolus 1,000 mL (0 mLs Intravenous Stopped 08/07/14 1425)  methylPREDNISolone sodium succinate (SOLU-MEDROL) 40 mg/mL injection 40 mg (40 mg Intravenous Given 08/07/14 1419)  diphenhydrAMINE (BENADRYL) injection 50 mg (50 mg Intravenous  Given 08/07/14 1419)  sodium chloride 0.9 % bolus 1,000 mL (0 mLs Intravenous Stopped 08/07/14 1729)  iohexol (OMNIPAQUE) 350 MG/ML injection 100 mL (100 mLs Intravenous Contrast Given 08/07/14 1655)  morphine 4 MG/ML injection 4 mg (4 mg Intravenous Given 08/07/14 1729)  aspirin chewable tablet 324 mg (324 mg Oral Given 08/07/14 1742)    New Prescriptions   No medications on file       Britt Bottom, NP 08/08/14 0319  Jasper Riling. Alvino Chapel, MD 08/08/14 1450

## 2014-08-07 NOTE — ED Notes (Signed)
Pt states she is in pain

## 2014-08-07 NOTE — ED Notes (Signed)
MD hospitalist at bedside.

## 2014-08-07 NOTE — H&P (Signed)
[facility] History and Physical  Sehrish Wenninger [id number] DOB: 1955-12-01 DOA: 08/07/2014  PCP: Chari Manning, NP   Chief Complaint: Acute left shoulder pain  HPI: Brandy Houston is a 59 y.o. female came to [facility] ed 08/07/2014 with a chief complaint of intractable left shoulder pain.  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  She says that she was lifting a large bag of laundry on Thursday, when she developed the acute onset of left shoulder pain.  She does not recall hearing a pop or snap.  By Friday morning, she could not lift her arm secondary to intractable pain.  She has taken Aleve, one of her daughter's muscle relaxers, and she has used a topical ointment, without significant relief.  Pain localizes to left shoulder and neck, with significantly reduced ROM in the left arm.  Pain level is still 5 out of 10 after one percocet 5/325 tablet and a total of 8mg  of IV morphine in the ED.  ED evaluation concerning for troponin of 0.09, which is already trending downward, and a resting sinus tachycardia, despite improved pain control.  EKG negative for acute ST segment changes.  The patient had an elevated D-Dimer, which prompted CTA chest.  PE has been ruled out.  She was pre-treated with IV solumedrol 40mg  and IV benadryl 50mg  prior to contrast exposure.    Patient reports normal stress test approximately 2 years ago (Report from May 2013 reviewed; from what I see, exercise portion suggested positive findings to suggest ischemia but nuclear images were pending; mild global hypokinesis with EF 50% and no evidence of reversible ischemia reported in discharge summary).  She denies chest pain, shortness of breath, light-headedness, or dizziness.  No N/V/D.  She is not on a diuretic at this time.  Denies recent illness.  No evidence of acute blood loss.  No dysuria.  Appetite has been normal.  Of note, she had an abnormally low TSH last year.  Observation admission requested  for intractable pain and sinus tachycardia.  Review of Systems: 12 systems reviewed and negative except as stated in HPI.  Past Medical History  Diagnosis Date  . Diabetes mellitus   . Hypertension   . Peripheral neuropathy    Past Surgical History  Procedure Laterality Date  . Cholecystectomy    . Appendectomy    . Small intestine surgery      Due to Small Bowel Obstruction  . Tubal ligation     Social History:  History   Social History Narrative  She has smoked 1/2 ppd for the past 20 years.  She will have an occasional beer.  No illicit drug use.  She is not married.  She has 3 adult children.  Unfortunately, her daughter has MS and a recent diagnosis of pancreatic cancer.  Allergies  Allergen Reactions  . Compazine [Prochlorperazine] Shortness Of Breath and Swelling    TONGUE SWELLS  . Omnipaque [Iohexol] Hives  . Shellfish-Derived Products Anaphylaxis  . Iodinated Diagnostic Agents Rash    Family History  Problem Relation Age of Onset  . Cancer Mother   . Heart disease Father   . Cancer Sister   . Cancer Brother   . Diabetes Brother   Mother had uterine cancer.  The patient believes that her sister also had some type of GYN cancer.  She does not know what type of cancer her brother had.  Father had MI in his 28's.  Prior to Admission medications   Medication  Sig Start Date End Date Taking? Authorizing Provider  aspirin 81 MG chewable tablet Chew 81-162 mg by mouth daily as needed (For stroke prevention.).    Yes Historical Provider, MD  Blood Glucose Monitoring Suppl (ON CALL EXPRESS GLUCOSE METER) DEVI Use as directed 01/25/14  Yes Lance Bosch, NP  glipiZIDE (GLUCOTROL) 10 MG tablet Take 1 tablet (10 mg total) by mouth 2 (two) times daily before a meal. 02/27/14  Yes Lance Bosch, NP  glucose blood test strip Use as instructed 01/25/14  Yes Lance Bosch, NP  hydrALAZINE (APRESOLINE) 25 MG tablet Take 1 tablet (25 mg total) by mouth 3 (three) times daily.  02/27/14  Yes Lance Bosch, NP  Menthol-Methyl Salicylate (MUSCLE RUB) 10-15 % CREA Apply 1 application topically daily as needed for muscle pain.   Yes Historical Provider, MD  TRUEPLUS LANCETS 30G MISC Use as directed 01/25/14  Yes Lance Bosch, NP  aspirin EC 81 MG tablet Take 1 tablet (81 mg total) by mouth daily as needed for pain. 04/14/13   Shanker Kristeen Mans, MD   Physical Exam: Filed Vitals:   08/07/14 1715 08/07/14 1730 08/07/14 1902 08/07/14 1930  BP:   146/82 145/82  Pulse: 119 118 115 115  Temp:      TempSrc:      Resp: 13 15 16 25   SpO2: 97% 98% 90% 93%  T 98.3   General:  Awake and alert, in pain but NAD   Eyes: PERRL bilaterally  ENT: No nasal drainage, dry mucous membranes  Neck: supple, nontender  Cardiovascular: tachycardic but regular, no murmur  Respiratory: CTA bilaterally  Abdomen: S/NT/ND bowel sounds present, well heal scars noted  Skin: warm  Musculoskeletal: TTP at left [facility] joint.  Cannot left her left arm off the bed without significant pain.  No reproducible pain in sternal area.  Psychiatric: Normal affect  Neurologic: ROM in LUE restricted due to pain but grip strength symmetric bilaterally.  Otherwise, no focal deficits.  Labs on Admission:  Basic Metabolic Panel:  Recent Labs Lab 08/07/14 1311  NA 131*  K 3.7  CL 96  CO2 27  GLUCOSE 186*  BUN 24*  CREATININE 1.66*  CALCIUM 8.4   CBC:  Recent Labs Lab 08/07/14 1311  WBC 11.5*  NEUTROABS 10.6*  HGB 11.6*  HCT 34.7*  MCV 94.0  PLT 201    Radiological Exams on Admission: Dg Chest 2 View  08/07/2014   CLINICAL DATA:  Left shoulder pain for 5 days. No known injury. Initial encounter.  EXAM: CHEST  2 VIEW  COMPARISON:  None.  FINDINGS: The heart size and mediastinal contours are within normal limits. Both lungs are clear. No pneumothorax or pleural effusion is noted. The visualized skeletal structures are unremarkable.  IMPRESSION: No acute cardiopulmonary abnormality  seen.   Electronically Signed   By: Marijo Conception, M.D.   On: 08/07/2014 15:02   Ct Angio Chest Pe W/cm &/or Wo Cm  08/07/2014   CLINICAL DATA:  Tachycardia and hypoxia. Left shoulder pain for 4 days. Renal insufficiency.  EXAM: CT ANGIOGRAPHY CHEST WITH CONTRAST  TECHNIQUE: Multidetector CT imaging of the chest was performed using the standard protocol during bolus administration of intravenous contrast. Multiplanar CT image reconstructions and MIPs were obtained to evaluate the vascular anatomy.  CONTRAST:  1102mL OMNIPAQUE IOHEXOL 350 MG/ML SOLN. The patient was pre-medicated with Benadryl and Solu-Medrol due to a history of hives with Omnipaque.  COMPARISON:  08/07/2014; 10/18/2011  FINDINGS: Mediastinum/Nodes: No filling defect is identified in the pulmonary arterial tree to suggest pulmonary embolus. Mild atherosclerotic calcification of the aortic arch without acute aortic abnormality identified. No pathologic thoracic adenopathy. Mild cardiomegaly.  Lungs/Pleura: Secondary pulmonary lobular interstitial accentuation noted particularly at the lung apices. Mild atelectasis dependently in both lower lobes. No airspace opacity identified.  Upper abdomen: Unremarkable  Musculoskeletal: Mid thoracic spondylosis and mild kyphosis.  Review of the MIP images confirms the above findings.  IMPRESSION: 1. Mild cardiomegaly with secondary pulmonary lobular interstitial accentuation compatible with mild interstitial edema. 2. No embolus identified.   Electronically Signed   By: Van Clines M.D.   On: 08/07/2014 17:25   Dg Shoulder Left  08/07/2014   CLINICAL DATA:  59 year old female with left shoulder pain after have the lifting injury on 08/03/2014. Initial encounter.  EXAM: LEFT SHOULDER - 2+ VIEW  COMPARISON:  None.  FINDINGS: Bone mineralization is within normal limits for age. No glenohumeral joint dislocation. Proximal left humerus intact. Left clavicle and scapula appear intact. Negative visible left  ribs and lung parenchyma.  IMPRESSION: No acute osseous abnormality identified at the left shoulder.   Electronically Signed   By: Genevie Ann M.D.   On: 08/07/2014 11:11    EKG: Independently reviewed. No acute ST segment changes.  Assessment/Plan Principal Problem:   Acute pain of left shoulder Active Problems:   Tachycardia   1.  Acute left shoulder pain --Admit for observation, trial of analgesics and muscle relaxers --Case discussed with radiology.  MRI typically done as outpatient. --May need PT  2.  Sinus tachycardia, likely pain response though sodium is low and BUN and creatinine are slightly elevated, suggesting mild degree of dehydrdation/AKI (with recent NSAID use) --Admit to telemetry --Follow troponin --Gentle hydration --Repeat labs in AM --Will check TSH, free T4  3.  Mild anemia, NOS --This appears to be new. --No gross evidence of blood loss --Anemia panel and will likely need outaptient follow-up.  Code Status: FULL Disposition Plan: Anticipate discharge to home in AM if stable.  Time spent: 60 minutes  [employer] [facility]  08/07/2014, 7:52 PM

## 2014-08-08 ENCOUNTER — Observation Stay (HOSPITAL_COMMUNITY): Payer: Medicaid Other

## 2014-08-08 DIAGNOSIS — I471 Supraventricular tachycardia: Secondary | ICD-10-CM

## 2014-08-08 LAB — GLUCOSE, CAPILLARY
Glucose-Capillary: 258 mg/dL — ABNORMAL HIGH (ref 70–99)
Glucose-Capillary: 259 mg/dL — ABNORMAL HIGH (ref 70–99)
Glucose-Capillary: 164 mg/dL — ABNORMAL HIGH (ref 70–99)
Glucose-Capillary: 168 mg/dL — ABNORMAL HIGH (ref 70–99)
Glucose-Capillary: 204 mg/dL — ABNORMAL HIGH (ref 70–99)

## 2014-08-08 LAB — BASIC METABOLIC PANEL
Anion gap: 8 (ref 5–15)
BUN: 29 mg/dL — ABNORMAL HIGH (ref 6–23)
CO2: 23 mmol/L (ref 19–32)
Calcium: 8.5 mg/dL (ref 8.4–10.5)
Chloride: 103 mmol/L (ref 96–112)
Creatinine, Ser: 1.64 mg/dL — ABNORMAL HIGH (ref 0.50–1.10)
GFR calc Af Amer: 39 mL/min — ABNORMAL LOW (ref >90)
GFR calc non Af Amer: 33 mL/min — ABNORMAL LOW (ref >90)
Glucose, Bld: 281 mg/dL — ABNORMAL HIGH (ref 70–99)
Potassium: 4.3 mmol/L (ref 3.5–5.1)
Sodium: 134 mmol/L — ABNORMAL LOW (ref 135–145)

## 2014-08-08 LAB — CBC
HCT: 35.3 % — ABNORMAL LOW (ref 36.0–46.0)
Hemoglobin: 11.4 g/dL — ABNORMAL LOW (ref 12.0–15.0)
MCH: 30.6 pg (ref 26.0–34.0)
MCHC: 32.3 g/dL (ref 30.0–36.0)
MCV: 94.9 fL (ref 78.0–100.0)
Platelets: 223 10*3/uL (ref 150–400)
RBC: 3.72 MIL/uL — ABNORMAL LOW (ref 3.87–5.11)
RDW: 13.2 % (ref 11.5–15.5)
WBC: 12.5 10*3/uL — ABNORMAL HIGH (ref 4.0–10.5)

## 2014-08-08 LAB — TSH: TSH: 0.589 u[IU]/mL (ref 0.350–4.500)

## 2014-08-08 LAB — IRON AND TIBC
Iron: <10 ug/dL — ABNORMAL LOW (ref 42–145)
UIBC: 195 ug/dL (ref 125–400)

## 2014-08-08 LAB — T4, FREE: Free T4: 1.12 ng/dL (ref 0.80–1.80)

## 2014-08-08 LAB — VITAMIN B12: Vitamin B-12: 717 pg/mL (ref 211–911)

## 2014-08-08 LAB — TROPONIN I
Troponin I: 0.09 ng/mL — ABNORMAL HIGH (ref <0.031)
Troponin I: 0.08 ng/mL — ABNORMAL HIGH (ref <0.031)

## 2014-08-08 LAB — FOLATE: Folate: >20 ng/mL

## 2014-08-08 LAB — FERRITIN: Ferritin: 234 ng/mL (ref 10–291)

## 2014-08-08 MED ORDER — FERROUS GLUCONATE 324 (38 FE) MG PO TABS
324.0000 mg | ORAL_TABLET | Freq: Every day | ORAL | Status: DC
  Administered 2014-08-09 – 2014-08-27 (×17): 324 mg via ORAL
  Filled 2014-08-08 (×21): qty 1

## 2014-08-08 NOTE — Progress Notes (Signed)
PROGRESS NOTE  Brandy Houston [id number] DOB: 25-Jan-1956 DOA: 08/07/2014 PCP: Chari Manning, NP   Subjective / 24 H Interval events Ongoing shoulder pain, unable to lift left arm  Assessment/Plan: Principal Problem:   Acute pain of left shoulder Active Problems:   Tachycardia   Sinus tachycardia   Left shoulder pain - with weakness, unable to lift arm more than ~30 degrees - obtain MRI - sling  Sinus tach - due to #1, troponin flat, likely demand  Acute on chronic renal failure, CKD II-III - stable  Anemia - iron deficient, start supplments  DM - SSI  HTN - continue hydralazine  Diet: Diet Carb Modified Fluids: none DVT Prophylaxis: SCD  Code Status: Full Code Family Communication: none  Disposition Plan: home when ready   Consultants:  None   Procedures:  None    Antibiotics  Anti-infectives    None       Studies  Dg Chest 2 View  08/07/2014   CLINICAL DATA:  Left shoulder pain for 5 days. No known injury. Initial encounter.  EXAM: CHEST  2 VIEW  COMPARISON:  None.  FINDINGS: The heart size and mediastinal contours are within normal limits. Both lungs are clear. No pneumothorax or pleural effusion is noted. The visualized skeletal structures are unremarkable.  IMPRESSION: No acute cardiopulmonary abnormality seen.   Electronically Signed   By: Marijo Conception, M.D.   On: 08/07/2014 15:02   Ct Angio Chest Pe W/cm &/or Wo Cm  08/07/2014   CLINICAL DATA:  Tachycardia and hypoxia. Left shoulder pain for 4 days. Renal insufficiency.  EXAM: CT ANGIOGRAPHY CHEST WITH CONTRAST  TECHNIQUE: Multidetector CT imaging of the chest was performed using the standard protocol during bolus administration of intravenous contrast. Multiplanar CT image reconstructions and MIPs were obtained to evaluate the vascular anatomy.  CONTRAST:  16mL OMNIPAQUE IOHEXOL 350 MG/ML SOLN. The patient was pre-medicated with Benadryl and Solu-Medrol due to a history of hives with  Omnipaque.  COMPARISON:  08/07/2014; 10/18/2011  FINDINGS: Mediastinum/Nodes: No filling defect is identified in the pulmonary arterial tree to suggest pulmonary embolus. Mild atherosclerotic calcification of the aortic arch without acute aortic abnormality identified. No pathologic thoracic adenopathy. Mild cardiomegaly.  Lungs/Pleura: Secondary pulmonary lobular interstitial accentuation noted particularly at the lung apices. Mild atelectasis dependently in both lower lobes. No airspace opacity identified.  Upper abdomen: Unremarkable  Musculoskeletal: Mid thoracic spondylosis and mild kyphosis.  Review of the MIP images confirms the above findings.  IMPRESSION: 1. Mild cardiomegaly with secondary pulmonary lobular interstitial accentuation compatible with mild interstitial edema. 2. No embolus identified.   Electronically Signed   By: Van Clines M.D.   On: 08/07/2014 17:25   Dg Shoulder Left  08/07/2014   CLINICAL DATA:  59 year old female with left shoulder pain after have the lifting injury on 08/03/2014. Initial encounter.  EXAM: LEFT SHOULDER - 2+ VIEW  COMPARISON:  None.  FINDINGS: Bone mineralization is within normal limits for age. No glenohumeral joint dislocation. Proximal left humerus intact. Left clavicle and scapula appear intact. Negative visible left ribs and lung parenchyma.  IMPRESSION: No acute osseous abnormality identified at the left shoulder.   Electronically Signed   By: Genevie Ann M.D.   On: 08/07/2014 11:11    Objective  Filed Vitals:   08/08/14 0054 08/08/14 0602 08/08/14 1057 08/08/14 1433  BP: 130/78 152/88 155/82 182/86  Pulse:  106  118  Temp:  98.4 F (36.9 C)  100.6 F (38.1 C)  TempSrc:  Oral  Oral  Resp:  20  19  Height:      Weight:      SpO2:  94%  92%    Intake/Output Summary (Last 24 hours) at 08/08/14 1459 Last data filed at 08/08/14 1434  Gross per 24 hour  Intake    960 ml  Output   2050 ml  Net  -1090 ml   Filed Weights   08/07/14 2009    Weight: 90.22 kg (198 lb 14.4 oz)    Exam:  General:  NAD  HEENT: no scleral icterus, PERRL  Cardiovascular: RRR  Respiratory: CTA biL, no wheezing  Abdomen: soft, non tender  MSK/Extremities: no clubbing, severe tenderness to palpation left shoulder, restricted ROM due to pain, weakness to abduction  Skin: no rashes  Neuro: non focal  Data Reviewed: Basic Metabolic Panel:  Recent Labs Lab 08/07/14 1311 08/08/14 0520  NA 131* 134*  K 3.7 4.3  CL 96 103  CO2 27 23  GLUCOSE 186* 281*  BUN 24* 29*  CREATININE 1.66* 1.64*  CALCIUM 8.4 8.5   CBC:  Recent Labs Lab 08/07/14 1311 08/08/14 0520  WBC 11.5* 12.5*  NEUTROABS 10.6*  --   HGB 11.6* 11.4*  HCT 34.7* 35.3*  MCV 94.0 94.9  PLT 201 223   Cardiac Enzymes:  Recent Labs Lab 08/08/14 0020 08/08/14 0520  TROPONINI 0.09* 0.08*   CBG:  Recent Labs Lab 08/07/14 2021 08/07/14 2304 08/08/14 0735 08/08/14 1157  GLUCAP 210* 258* 259* 164*   Scheduled Meds: . aspirin  81 mg Oral Daily  . cyclobenzaprine  5 mg Oral BID  . famotidine  20 mg Oral BID  . glipiZIDE  10 mg Oral BID AC  . hydrALAZINE  25 mg Oral TID  . insulin aspart  0-9 Units Subcutaneous TID WC  . sodium chloride  3 mL Intravenous Q12H   Continuous Infusions:   Marzetta Board, MD [facility] Pager [phone]. If 7 PM - 7 AM, please contact night-coverage at [email or web address], password [facility] 08/08/2014, 2:59 PM  LOS: 1 day

## 2014-08-08 NOTE — Progress Notes (Signed)
Inpatient Diabetes Program Recommendations  AACE/ADA: New Consensus Statement on Inpatient Glycemic Control (2013)  Target Ranges:  Prepandial:   less than 140 mg/dL      Peak postprandial:   less than 180 mg/dL (1-2 hours)      Critically ill patients:  140 - 180 mg/dL   Reason for Visit: Hyperglycemia  Diabetes history: DM2 Outpatient Diabetes medications: glipizide 10 mg bid Current orders for Inpatient glycemic control: Novolog sensitive tidwc and glipizide 10 mg bid  Results for DINASIA, SHROFF (MRN [id number]) as of 08/08/2014 10:34  Ref. Range 08/07/2014 20:21 08/07/2014 23:04 08/08/2014 07:35  Glucose-Capillary Latest Range: 70-99 mg/dL 210 (H) 258 (H) 259 (H)    Inpatient Diabetes Program Recommendations Correction (SSI): Increase Novolog to moderate tidwc and hs HgbA1C: Need updated HgbA1C to assess glycemic control prior to hospitalization  Note: Will follow. Thank you. Lorenda Peck, RD, LDN, CDE Inpatient Diabetes Coordinator [phone]

## 2014-08-09 DIAGNOSIS — R5381 Other malaise: Secondary | ICD-10-CM | POA: Diagnosis not present

## 2014-08-09 DIAGNOSIS — A4101 Sepsis due to Methicillin susceptible Staphylococcus aureus: Secondary | ICD-10-CM | POA: Diagnosis present

## 2014-08-09 DIAGNOSIS — Z809 Family history of malignant neoplasm, unspecified: Secondary | ICD-10-CM | POA: Diagnosis not present

## 2014-08-09 DIAGNOSIS — Z79899 Other long term (current) drug therapy: Secondary | ICD-10-CM | POA: Diagnosis not present

## 2014-08-09 DIAGNOSIS — E8770 Fluid overload, unspecified: Secondary | ICD-10-CM | POA: Diagnosis not present

## 2014-08-09 DIAGNOSIS — E1121 Type 2 diabetes mellitus with diabetic nephropathy: Secondary | ICD-10-CM | POA: Diagnosis present

## 2014-08-09 DIAGNOSIS — M00011 Staphylococcal arthritis, right shoulder: Secondary | ICD-10-CM | POA: Diagnosis present

## 2014-08-09 DIAGNOSIS — R29810 Facial weakness: Secondary | ICD-10-CM | POA: Diagnosis present

## 2014-08-09 DIAGNOSIS — Z888 Allergy status to other drugs, medicaments and biological substances status: Secondary | ICD-10-CM | POA: Diagnosis not present

## 2014-08-09 DIAGNOSIS — N12 Tubulo-interstitial nephritis, not specified as acute or chronic: Secondary | ICD-10-CM | POA: Diagnosis not present

## 2014-08-09 DIAGNOSIS — M65841 Other synovitis and tenosynovitis, right hand: Secondary | ICD-10-CM | POA: Diagnosis present

## 2014-08-09 DIAGNOSIS — N186 End stage renal disease: Secondary | ICD-10-CM | POA: Diagnosis not present

## 2014-08-09 DIAGNOSIS — M00061 Staphylococcal arthritis, right knee: Secondary | ICD-10-CM | POA: Diagnosis present

## 2014-08-09 DIAGNOSIS — Z6834 Body mass index (BMI) 34.0-34.9, adult: Secondary | ICD-10-CM | POA: Diagnosis not present

## 2014-08-09 DIAGNOSIS — Z91041 Radiographic dye allergy status: Secondary | ICD-10-CM | POA: Diagnosis not present

## 2014-08-09 DIAGNOSIS — M00012 Staphylococcal arthritis, left shoulder: Secondary | ICD-10-CM | POA: Diagnosis present

## 2014-08-09 DIAGNOSIS — R34 Anuria and oliguria: Secondary | ICD-10-CM | POA: Diagnosis not present

## 2014-08-09 DIAGNOSIS — M00262 Other streptococcal arthritis, left knee: Secondary | ICD-10-CM | POA: Diagnosis not present

## 2014-08-09 DIAGNOSIS — D509 Iron deficiency anemia, unspecified: Secondary | ICD-10-CM | POA: Diagnosis present

## 2014-08-09 DIAGNOSIS — F17210 Nicotine dependence, cigarettes, uncomplicated: Secondary | ICD-10-CM | POA: Diagnosis present

## 2014-08-09 DIAGNOSIS — E1165 Type 2 diabetes mellitus with hyperglycemia: Secondary | ICD-10-CM | POA: Diagnosis present

## 2014-08-09 DIAGNOSIS — M609 Myositis, unspecified: Secondary | ICD-10-CM | POA: Diagnosis present

## 2014-08-09 DIAGNOSIS — N049 Nephrotic syndrome with unspecified morphologic changes: Secondary | ICD-10-CM | POA: Diagnosis not present

## 2014-08-09 DIAGNOSIS — M00261 Other streptococcal arthritis, right knee: Secondary | ICD-10-CM | POA: Diagnosis not present

## 2014-08-09 DIAGNOSIS — M00812 Arthritis due to other bacteria, left shoulder: Secondary | ICD-10-CM

## 2014-08-09 DIAGNOSIS — I120 Hypertensive chronic kidney disease with stage 5 chronic kidney disease or end stage renal disease: Secondary | ICD-10-CM | POA: Diagnosis present

## 2014-08-09 DIAGNOSIS — D62 Acute posthemorrhagic anemia: Secondary | ICD-10-CM | POA: Diagnosis not present

## 2014-08-09 DIAGNOSIS — Z8249 Family history of ischemic heart disease and other diseases of the circulatory system: Secondary | ICD-10-CM | POA: Diagnosis not present

## 2014-08-09 DIAGNOSIS — R5081 Fever presenting with conditions classified elsewhere: Secondary | ICD-10-CM

## 2014-08-09 DIAGNOSIS — B9689 Other specified bacterial agents as the cause of diseases classified elsewhere: Secondary | ICD-10-CM

## 2014-08-09 DIAGNOSIS — M009 Pyogenic arthritis, unspecified: Secondary | ICD-10-CM | POA: Diagnosis not present

## 2014-08-09 DIAGNOSIS — L02413 Cutaneous abscess of right upper limb: Secondary | ICD-10-CM | POA: Diagnosis not present

## 2014-08-09 DIAGNOSIS — E1142 Type 2 diabetes mellitus with diabetic polyneuropathy: Secondary | ICD-10-CM | POA: Diagnosis present

## 2014-08-09 DIAGNOSIS — M00062 Staphylococcal arthritis, left knee: Secondary | ICD-10-CM | POA: Diagnosis present

## 2014-08-09 DIAGNOSIS — R509 Fever, unspecified: Secondary | ICD-10-CM

## 2014-08-09 DIAGNOSIS — N170 Acute kidney failure with tubular necrosis: Secondary | ICD-10-CM | POA: Diagnosis not present

## 2014-08-09 DIAGNOSIS — R319 Hematuria, unspecified: Secondary | ICD-10-CM | POA: Diagnosis not present

## 2014-08-09 DIAGNOSIS — B9561 Methicillin susceptible Staphylococcus aureus infection as the cause of diseases classified elsewhere: Secondary | ICD-10-CM | POA: Diagnosis present

## 2014-08-09 DIAGNOSIS — R21 Rash and other nonspecific skin eruption: Secondary | ICD-10-CM | POA: Diagnosis not present

## 2014-08-09 DIAGNOSIS — Z91013 Allergy to seafood: Secondary | ICD-10-CM | POA: Diagnosis not present

## 2014-08-09 DIAGNOSIS — R791 Abnormal coagulation profile: Secondary | ICD-10-CM | POA: Diagnosis not present

## 2014-08-09 DIAGNOSIS — B182 Chronic viral hepatitis C: Secondary | ICD-10-CM | POA: Diagnosis present

## 2014-08-09 DIAGNOSIS — Z7982 Long term (current) use of aspirin: Secondary | ICD-10-CM | POA: Diagnosis not present

## 2014-08-09 DIAGNOSIS — R000 Tachycardia, unspecified: Secondary | ICD-10-CM | POA: Diagnosis present

## 2014-08-09 DIAGNOSIS — G629 Polyneuropathy, unspecified: Secondary | ICD-10-CM | POA: Diagnosis present

## 2014-08-09 DIAGNOSIS — E871 Hypo-osmolality and hyponatremia: Secondary | ICD-10-CM | POA: Diagnosis present

## 2014-08-09 LAB — GLUCOSE, CAPILLARY
Glucose-Capillary: 252 mg/dL — ABNORMAL HIGH (ref 70–99)
Glucose-Capillary: 194 mg/dL — ABNORMAL HIGH (ref 70–99)
Glucose-Capillary: 240 mg/dL — ABNORMAL HIGH (ref 70–99)
Glucose-Capillary: 221 mg/dL — ABNORMAL HIGH (ref 70–99)

## 2014-08-09 LAB — C-REACTIVE PROTEIN: CRP: 18.3 mg/dL — ABNORMAL HIGH (ref <0.60)

## 2014-08-09 LAB — URINE MICROSCOPIC-ADD ON

## 2014-08-09 LAB — URINALYSIS, ROUTINE W REFLEX MICROSCOPIC
Bilirubin Urine: NEGATIVE
Glucose, UA: 250 mg/dL — AB
Ketones, ur: NEGATIVE mg/dL
Leukocytes, UA: NEGATIVE
Nitrite: POSITIVE — AB
Protein, ur: >300 mg/dL — AB
Specific Gravity, Urine: 1.027 (ref 1.005–1.030)
Urobilinogen, UA: 0.2 mg/dL (ref 0.0–1.0)
pH: 5.5 (ref 5.0–8.0)

## 2014-08-09 LAB — SEDIMENTATION RATE: Sed Rate: 120 mm/hr — ABNORMAL HIGH (ref 0–22)

## 2014-08-09 LAB — LACTIC ACID, PLASMA: Lactic Acid, Venous: 0.9 mmol/L (ref 0.5–2.0)

## 2014-08-09 MED ORDER — HYDRALAZINE HCL 20 MG/ML IJ SOLN
5.0000 mg | Freq: Once | INTRAMUSCULAR | Status: AC
  Administered 2014-08-09: 5 mg via INTRAVENOUS
  Filled 2014-08-09: qty 1

## 2014-08-09 MED ORDER — CEFTRIAXONE SODIUM IN DEXTROSE 40 MG/ML IV SOLN
2.0000 g | INTRAVENOUS | Status: DC
  Filled 2014-08-09: qty 50

## 2014-08-09 MED ORDER — VANCOMYCIN HCL IN DEXTROSE 750-5 MG/150ML-% IV SOLN
750.0000 mg | Freq: Two times a day (BID) | INTRAVENOUS | Status: DC
  Administered 2014-08-10 – 2014-08-12 (×5): 750 mg via INTRAVENOUS
  Filled 2014-08-09 (×6): qty 150

## 2014-08-09 MED ORDER — ACETAMINOPHEN 325 MG PO TABS
650.0000 mg | ORAL_TABLET | Freq: Four times a day (QID) | ORAL | Status: DC | PRN
  Administered 2014-08-09 – 2014-08-31 (×3): 650 mg via ORAL
  Filled 2014-08-09 (×4): qty 2

## 2014-08-09 MED ORDER — CEFTRIAXONE SODIUM IN DEXTROSE 40 MG/ML IV SOLN
2.0000 g | Freq: Once | INTRAVENOUS | Status: AC
  Administered 2014-08-09: 2 g via INTRAVENOUS
  Filled 2014-08-09: qty 50

## 2014-08-09 MED ORDER — VANCOMYCIN HCL 10 G IV SOLR
2000.0000 mg | Freq: Once | INTRAVENOUS | Status: AC
  Administered 2014-08-09: 2000 mg via INTRAVENOUS
  Filled 2014-08-09: qty 2000

## 2014-08-09 NOTE — Progress Notes (Signed)
UR completed 

## 2014-08-09 NOTE — Consult Note (Signed)
[facility]  Total days of antibiotics 0       Reason for Consult:     Referring Physician:   Principal Problem:   Acute pain of left shoulder Active Problems:   Tachycardia   Sinus tachycardia    HPI: Brandy Houston is a 59 y.o. 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 She started to use nsaids and muscle relaxants without relief. She presented to the ED on 2/22 for evaluation.physical exam showed definitive decreased ROM due to pain. Labs revealed slight leukocytosis with left shift. She underwent MRI that suggested septic arthritis of AC joint with associated myositis. She is now having occ fevers with tmax of 100.55F in the last 24hr. The patient has worsening pain and now feels  Achy all over. No scratch, puncture injury. Roughly 2 months ago, she took amoxicillin for dental abscess. She did notice her BS increased > 200 since her injury of shoulder, generally her BS are 150s.  Past Medical History  Diagnosis Date  . Diabetes mellitus   . Hypertension   . Peripheral neuropathy     Allergies:  Allergies  Allergen Reactions  . Compazine [Prochlorperazine] Shortness Of Breath and Swelling    TONGUE SWELLS  . Omnipaque [Iohexol] Hives  . Shellfish-Derived Products Anaphylaxis  . Iodinated Diagnostic Agents Rash    MEDICATIONS: . aspirin  81 mg Oral Daily  . cyclobenzaprine  5 mg Oral BID  . famotidine  20 mg Oral BID  . ferrous gluconate  324 mg Oral Q breakfast  . glipiZIDE  10 mg Oral BID AC  . hydrALAZINE  25 mg Oral TID  . insulin aspart  0-9 Units Subcutaneous TID WC  . sodium chloride  3 mL Intravenous Q12H    History  Substance Use Topics  . Smoking status: Current Every Day Smoker -- 0.50 packs/day for 20 years  . Smokeless tobacco: Never Used  . Alcohol Use: Yes     Comment: ocassionally    Family History  Problem  Relation Age of Onset  . Cancer Mother   . Heart disease Father   . Cancer Sister   . Cancer Brother   . Diabetes Brother     Review of Systems - Per hpi,  Review of Systems  Constitutional: Negative for fever, chills, diaphoresis, activity change, appetite change, fatigue and unexpected weight change.  HENT: Negative for congestion, sore throat, rhinorrhea, sneezing, trouble swallowing and sinus pressure.  Eyes: Negative for photophobia and visual disturbance.  Respiratory: Negative for cough, chest tightness, shortness of breath, wheezing and stridor.  Cardiovascular: Negative for chest pain, palpitations and leg swelling.  Gastrointestinal: Negative for nausea, vomiting, abdominal pain, diarrhea, constipation, blood in stool, abdominal distention and anal bleeding.  Genitourinary: Negative for dysuria, hematuria, flank pain and difficulty urinating.  Musculoskeletal: + shoulder pain Skin: Negative for color change, pallor, rash and wound.  Neurological: Negative for dizziness, tremors, weakness and light-headedness.  Hematological: Negative for adenopathy. Does not bruise/bleed easily.  Psychiatric/Behavioral: Negative for behavioral problems, confusion, sleep disturbance, dysphoric mood, decreased concentration and agitation.     OBJECTIVE: Temp:  [99 F (37.2 C)-99.9 F (37.7 C)] 99 F (37.2 C) (02/24 1341) Pulse Rate:  [114-125] 118 (02/24 1341) Resp:  [20-21] 20 (02/24 1341) BP: (150-193)/(71-95) 167/93 mmHg (02/24 1341) SpO2:  [92 %-97 %] 92 % (02/24 1341) Weight:  [628  lb (89.812 kg)] 198 lb (89.812 kg) (02/23 1626) Physical Exam  Constitutional:  oriented to person, place, and time. appears well-developed and well-nourished. No distress.  HENT:  Mouth/Throat: Oropharynx is clear and moist. No oropharyngeal exudate.  Cardiovascular: Normal rate, regular rhythm and normal heart sounds. Exam reveals no gallop and no friction rub.  No murmur heard.  Pulmonary/Chest:  Effort normal and breath sounds normal. No respiratory distress.  has no wheezes.  Abdominal: Soft. Bowel sounds are normal.  exhibits no distension. There is no tenderness.  Lymphadenopathy: no cervical adenopathy.  Neurological: alert and oriented to person, place, and time. Decrease grip strength on right hand, limited by pain. Ext: decrease range of motion when lifting arm up above head. +1 edema to fingers of right hand Skin: Skin is warm and dry. No rash noted. No erythema.  Psychiatric: a normal mood and affect.  behavior is normal.   LABS: Results for orders placed or performed during the hospital encounter of 08/07/14 (from the past 48 hour(s))  Protime-INR     Status: None   Collection Time: 08/07/14  4:03 PM  Result Value Ref Range   Prothrombin Time 14.4 11.6 - 15.2 seconds   INR 1.11 0.00 - 1.49  APTT     Status: None   Collection Time: 08/07/14  4:03 PM  Result Value Ref Range   aPTT 35 24 - 37 seconds  I-Stat Troponin, ED (not at [facility])     Status: Abnormal   Collection Time: 08/07/14  4:12 PM  Result Value Ref Range   Troponin i, poc 0.09 (HH) 0.00 - 0.08 ng/mL   Comment 3            Comment: Due to the release kinetics of cTnI, a negative result within the first hours of the onset of symptoms does not rule out myocardial infarction with certainty. If myocardial infarction is still suspected, repeat the test at appropriate intervals.   I-stat troponin, ED     Status: None   Collection Time: 08/07/14  5:59 PM  Result Value Ref Range   Troponin i, poc 0.04 0.00 - 0.08 ng/mL   Comment 3            Comment: Due to the release kinetics of cTnI, a negative result within the first hours of the onset of symptoms does not rule out myocardial infarction with certainty. If myocardial infarction is still suspected, repeat the test at appropriate intervals.   Glucose, capillary     Status: Abnormal   Collection Time: 08/07/14  8:21 PM  Result Value Ref Range    Glucose-Capillary 210 (H) 70 - 99 mg/dL   Comment 1 Notify RN   TSH     Status: None   Collection Time: 08/07/14  9:14 PM  Result Value Ref Range   TSH 0.589 0.350 - 4.500 uIU/mL    Comment: Performed at [facility]  Reticulocytes     Status: Abnormal   Collection Time: 08/07/14  9:14 PM  Result Value Ref Range   Retic Ct Pct 2.6 0.4 - 3.1 %   RBC. 3.77 (L) 3.87 - 5.11 MIL/uL   Retic Count, Manual 98.0 19.0 - 186.0 K/uL  Glucose, capillary     Status: Abnormal   Collection Time: 08/07/14 11:04 PM  Result Value Ref Range   Glucose-Capillary 258 (H) 70 - 99 mg/dL   Comment 1 Notify RN   Troponin I     Status: Abnormal   Collection Time:  08/08/14 12:20 AM  Result Value Ref Range   Troponin I 0.09 (H) <0.031 ng/mL    Comment:        PERSISTENTLY INCREASED TROPONIN VALUES IN THE RANGE OF 0.04-0.49 ng/mL CAN BE SEEN IN:       -UNSTABLE ANGINA       -CONGESTIVE HEART FAILURE       -MYOCARDITIS       -CHEST TRAUMA       -ARRYHTHMIAS       -LATE PRESENTING MYOCARDIAL INFARCTION       -COPD   CLINICAL FOLLOW-UP RECOMMENDED.   Vitamin B12     Status: None   Collection Time: 08/08/14 12:20 AM  Result Value Ref Range   Vitamin B-12 717 211 - 911 pg/mL    Comment: Performed at [facility]  Iron and TIBC     Status: Abnormal   Collection Time: 08/08/14 12:20 AM  Result Value Ref Range   Iron <10 (L) 42 - 145 ug/dL    Comment: Result repeated and verified.   TIBC Not calculated due to Iron <10. 250 - 470 ug/dL   Saturation Ratios Not calculated due to Iron <10. 20 - 55 %   UIBC 195 125 - 400 ug/dL    Comment: Performed at [facility]  Ferritin     Status: None   Collection Time: 08/08/14 12:20 AM  Result Value Ref Range   Ferritin 234 10 - 291 ng/mL    Comment: Performed at [facility]  Folate     Status: None   Collection Time: 08/08/14 12:20 AM  Result Value Ref Range   Folate >20.0 ng/mL    Comment: (NOTE) Reference Ranges         Deficient:       0.4 - 3.3 ng/mL        Indeterminate:   3.4 - 5.4 ng/mL        Normal:              > 5.4 ng/mL Performed at [facility]   T4, free     Status: None   Collection Time: 08/08/14 12:20 AM  Result Value Ref Range   Free T4 1.12 0.80 - 1.80 ng/dL    Comment: Performed at [facility]  CBC     Status: Abnormal   Collection Time: 08/08/14  5:20 AM  Result Value Ref Range   WBC 12.5 (H) 4.0 - 10.5 K/uL   RBC 3.72 (L) 3.87 - 5.11 MIL/uL   Hemoglobin 11.4 (L) 12.0 - 15.0 g/dL   HCT 35.3 (L) 36.0 - 46.0 %   MCV 94.9 78.0 - 100.0 fL   MCH 30.6 26.0 - 34.0 pg   MCHC 32.3 30.0 - 36.0 g/dL   RDW 13.2 11.5 - 15.5 %   Platelets 223 150 - 400 K/uL  Basic metabolic panel     Status: Abnormal   Collection Time: 08/08/14  5:20 AM  Result Value Ref Range   Sodium 134 (L) 135 - 145 mmol/L   Potassium 4.3 3.5 - 5.1 mmol/L   Chloride 103 96 - 112 mmol/L   CO2 23 19 - 32 mmol/L   Glucose, Bld 281 (H) 70 - 99 mg/dL   BUN 29 (H) 6 - 23 mg/dL   Creatinine, Ser 1.64 (H) 0.50 - 1.10 mg/dL   Calcium 8.5 8.4 - 10.5 mg/dL   GFR calc non Af Amer 33 (L) >90 mL/min   GFR  calc Af Amer 39 (L) >90 mL/min    Comment: (NOTE) The eGFR has been calculated using the CKD EPI equation. This calculation has not been validated in all clinical situations. eGFR's persistently <90 mL/min signify possible Chronic Kidney Disease.    Anion gap 8 5 - 15  Troponin I     Status: Abnormal   Collection Time: 08/08/14  5:20 AM  Result Value Ref Range   Troponin I 0.08 (H) <0.031 ng/mL    Comment:        PERSISTENTLY INCREASED TROPONIN VALUES IN THE RANGE OF 0.04-0.49 ng/mL CAN BE SEEN IN:       -UNSTABLE ANGINA       -CONGESTIVE HEART FAILURE       -MYOCARDITIS       -CHEST TRAUMA       -ARRYHTHMIAS       -LATE PRESENTING MYOCARDIAL INFARCTION       -COPD   CLINICAL FOLLOW-UP RECOMMENDED.   Glucose, capillary     Status: Abnormal   Collection Time: 08/08/14  7:35 AM  Result Value  Ref Range   Glucose-Capillary 259 (H) 70 - 99 mg/dL  Glucose, capillary     Status: Abnormal   Collection Time: 08/08/14 11:57 AM  Result Value Ref Range   Glucose-Capillary 164 (H) 70 - 99 mg/dL  Glucose, capillary     Status: Abnormal   Collection Time: 08/08/14  5:32 PM  Result Value Ref Range   Glucose-Capillary 168 (H) 70 - 99 mg/dL  Glucose, capillary     Status: Abnormal   Collection Time: 08/08/14  9:30 PM  Result Value Ref Range   Glucose-Capillary 204 (H) 70 - 99 mg/dL  Glucose, capillary     Status: Abnormal   Collection Time: 08/09/14  7:25 AM  Result Value Ref Range   Glucose-Capillary 252 (H) 70 - 99 mg/dL  Glucose, capillary     Status: Abnormal   Collection Time: 08/09/14 11:51 AM  Result Value Ref Range   Glucose-Capillary 194 (H) 70 - 99 mg/dL    MICRO: 12/24 blood cx pending  IMAGING: Ct Angio Chest Pe W/cm &/or Wo Cm  08/07/2014   CLINICAL DATA:  Tachycardia and hypoxia. Left shoulder pain for 4 days. Renal insufficiency.  EXAM: CT ANGIOGRAPHY CHEST WITH CONTRAST  TECHNIQUE: Multidetector CT imaging of the chest was performed using the standard protocol during bolus administration of intravenous contrast. Multiplanar CT image reconstructions and MIPs were obtained to evaluate the vascular anatomy.  CONTRAST:  118m OMNIPAQUE IOHEXOL 350 MG/ML SOLN. The patient was pre-medicated with Benadryl and Solu-Medrol due to a history of hives with Omnipaque.  COMPARISON:  08/07/2014; 10/18/2011  FINDINGS: Mediastinum/Nodes: No filling defect is identified in the pulmonary arterial tree to suggest pulmonary embolus. Mild atherosclerotic calcification of the aortic arch without acute aortic abnormality identified. No pathologic thoracic adenopathy. Mild cardiomegaly.  Lungs/Pleura: Secondary pulmonary lobular interstitial accentuation noted particularly at the lung apices. Mild atelectasis dependently in both lower lobes. No airspace opacity identified.  Upper abdomen:  Unremarkable  Musculoskeletal: Mid thoracic spondylosis and mild kyphosis.  Review of the MIP images confirms the above findings.  IMPRESSION: 1. Mild cardiomegaly with secondary pulmonary lobular interstitial accentuation compatible with mild interstitial edema. 2. No embolus identified.   Electronically Signed   By: WVan ClinesM.D.   On: 08/07/2014 17:25   Mr Shoulder Left Wo Contrast  08/09/2014   CLINICAL DATA:  Acute onset intractable left shoulder pain 08/03/2014 after are a  lifting injury.  EXAM: MRI OF THE LEFT SHOULDER WITHOUT CONTRAST  TECHNIQUE: Multiplanar, multisequence MR imaging of the shoulder was performed. No intravenous contrast was administered.  COMPARISON:  Plain films left shoulder 08/07/2014.  FINDINGS: The study is degraded by patient motion.  Rotator cuff: Supraspinatus worse than infraspinatus tendinopathy without tear is identified.  Muscles:  No atrophy or focal lesion.  Biceps long head:  Intact.  Acromioclavicular Joint: Moderate appearing acromioclavicular osteoarthritis is present. There is fluid within the joint. Intense soft tissue edema is seen about the joint and surrounding fatty and muscular tissues including the deltoid and superior aspect of the supraspinatus. No focal fluid collection is identified.  Labrum:  Intact.  Bones: As described above. No fracture is identified. The acromion is type 2. There is fluid in the subacromial/subdeltoid bursa.  IMPRESSION: Dominant finding is the abnormal appearance of the acromioclavicular joint highly suspicious for septic joint with surrounding cellulitis and myositis.  Moderate acromioclavicular osteoarthritis.  Mild supraspinatus and infraspinatus tendinopathy without tear.  These results will be called to the ordering clinician or representative by the Radiologist Assistant, and communication documented in the PACS or zVision Dashboard.   Electronically Signed   By: Inge Rise M.D.   On: 08/09/2014 08:48     Assessment/Plan:  59yo F with DM, HTN who sustained injury to left shoulder on 2/18 now has imaging and clinical presentation suggestive of septic arthritis  Left shoulder septic arthritis = recommend that joint is aspirated with fluid sent for cell count and aerobic culture. Can then start empiric vancomycin adn ceftriaxone. If patient clinically worsens, would start antibiotics now and not wait for aspiration. Recommend to check sed rate and crp. Unclear where source may be, possibly she is colonized with staph aureus or group b strep to have quick onset. Would also consider actinomyces since she did have dental abscess roughly 2 months ago.   Health maintenance =would check hemoglobin a1c, hep c, and hiv  Limited grip strength = likely from myositis associate with septic arthritis. Will continue to monitor  Fevers= recommend repeat blood cx if still febrile tomorrow.

## 2014-08-09 NOTE — Progress Notes (Signed)
PROGRESS NOTE  Brandy Houston [id number] DOB: 1955-12-01 DOA: 08/07/2014 PCP: Chari Manning, NP  HPI: Brandy Houston is a 59 yo African American female who has a history of Diabetes, hypertension, and peripheral neuropathy. She presented to [facility] ED on 2/22 with intractable left shoulder pain. 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 On Friday she could not longer lift her arm due to her pain level. She used Aleve, Bengay, and her daughter's muscle relaxers with minimal relief. On 2/22 pain was localized to left shoulder with significantly reduced ROM.   Subjective Today, she states the pain is worse and now has pain and decreased ROM in her right shoulder and right middle finger. This new pain was first noticed this morning. She denies chest pain, palpitations, shortness of breath, nausea, vomiting. The MRI of left shoulder from 2/23 shows findings suspicious for septic joint in the left acromioclavicular joint. She denies recent IV drug use, last use was when she was a teenager.   Assessment/Plan:  Acute Left shoulder pain / concern for septic arthritis AC joint - shoulder pain started on 2/19, with remarkable decrease in ROM  - 2/23 MRI of left shoulder shows septic joint - consulted orthopedic surgery to evaluate for possible joint aspiration. Holding antibiotics for now to increase the yield for fluid micro, will start Vancomycin/Ceftriaxone soon after aspiration.  - Dilaudid 0.5mg  Q4 PRN and Hydrocodone-acetaminophen 5-325 mg Q4 PRN for pain management - Flexeril  - increased pain right shoulder and right hand ?hematogenous spread from a different source. Will obtain blood cultures, 2D echo. ID consulted, appreciate input.   Sinus Tachycardia  - due to #1  - Mostly likely attributed to significant joint pain; will watch closely   Chronic renal failure, CKD II-III - Creatinine is at 1.64, baseline 1.15 - Stable since  admission  Anemia - Hb 11.4 on 2/23; stable now - Continue iron supplements  Diabetes, uncontrolled - Latest CBG was 194 - SSI and glipizide  - repeat A1C  Hypertension - Current BP is 152/71; most likely contributed to current pain level - Continue hydralazine 25mg  TID  DVT Prophylaxis:  SCD   Code Status: Full  Family Communication: Discussed with patient; family not present Disposition Plan: Inpatient  Consultants:  Orthopedic surgery   ID  Procedures:  None  Antibiotics:  None  Objective: Filed Vitals:   08/08/14 1433 08/08/14 2054 08/09/14 0606 08/09/14 1106  BP: 182/86 150/75 193/95 152/71  Pulse: 118 125 120 114  Temp: 100.6 F (38.1 C) 99.8 F (37.7 C) 99.2 F (37.3 C) 99.9 F (37.7 C)  TempSrc: Oral Oral Oral Oral  Resp: 19 20 21    Height:      Weight:      SpO2: 92% 95% 95% 97%    Intake/Output Summary (Last 24 hours) at 08/09/14 1148 Last data filed at 08/09/14 0900  Gross per 24 hour  Intake   1560 ml  Output   1650 ml  Net    -90 ml   Filed Weights   08/07/14 2009  Weight: 90.22 kg (198 lb 14.4 oz)   Exam: General: Well developed, well nourished, appears to be in significant pain and distressed, appears stated age  68:  EOMI, Anicteic Sclera, MMM. No pharyngeal erythema or exudates  Neck: Supple, no JVD, no masses  Cardiovascular: Tachycardic with regular rhythm, S1 S2 auscultated, no rubs, murmurs or gallops.   Respiratory: Clear to auscultation bilaterally  with equal chest rise  Abdomen: Soft, nontender, nondistended, + bowel sounds  Extremities: warm dry without cyanosis clubbing or edema. Left shoulder had a significantly decreased ROM in all planes. Right shoulder showed slightly decreased ROM, flexion 90 degrees, abduction 30 degress. Right middle finger had decreased ROM, especially with flexion of 20 degrees, and shows mild edema in the proximal IP joint. Extreme tenderness to light palpation of left shoulder with no  erythema, but did feel edematous and warmer to touch. Significant tenderness to palpation of right shoulder and right proximal IP joint of the middle finger.  Neuro: AAOx3, cranial nerves grossly intact. Strength 5/5 in lower extremities. Strength not examined on left shoulder due to extreme pain. 5/5 left grip strength. Strength 4/5 in right biceps and right shoulder. Right grip strength significantly decreased/unattainable due to right middle finger pain.  Skin: Without rashes exudates or nodules.   Psych: Normal affect and demeanor with intact judgement and insight  Data Reviewed: Basic Metabolic Panel:  Recent Labs Lab 08/07/14 1311 08/08/14 0520  NA 131* 134*  K 3.7 4.3  CL 96 103  CO2 27 23  GLUCOSE 186* 281*  BUN 24* 29*  CREATININE 1.66* 1.64*  CALCIUM 8.4 8.5   CBC:  Recent Labs Lab 08/07/14 1311 08/08/14 0520  WBC 11.5* 12.5*  NEUTROABS 10.6*  --   HGB 11.6* 11.4*  HCT 34.7* 35.3*  MCV 94.0 94.9  PLT 201 223   Cardiac Enzymes:  Recent Labs Lab 08/08/14 0020 08/08/14 0520  TROPONINI 0.09* 0.08*   CBG:  Recent Labs Lab 08/08/14 0735 08/08/14 1157 08/08/14 1732 08/08/14 2130 08/09/14 0725  GLUCAP 259* 164* 168* 204* 252*   Studies: Dg Chest 2 View  08/07/2014   CLINICAL DATA:  Left shoulder pain for 5 days. No known injury. Initial encounter.  EXAM: CHEST  2 VIEW  COMPARISON:  None.  FINDINGS: The heart size and mediastinal contours are within normal limits. Both lungs are clear. No pneumothorax or pleural effusion is noted. The visualized skeletal structures are unremarkable.  IMPRESSION: No acute cardiopulmonary abnormality seen.   Electronically Signed   By: Marijo Conception, M.D.   On: 08/07/2014 15:02   Ct Angio Chest Pe W/cm &/or Wo Cm  08/07/2014   CLINICAL DATA:  Tachycardia and hypoxia. Left shoulder pain for 4 days. Renal insufficiency.  EXAM: CT ANGIOGRAPHY CHEST WITH CONTRAST  TECHNIQUE: Multidetector CT imaging of the chest was performed  using the standard protocol during bolus administration of intravenous contrast. Multiplanar CT image reconstructions and MIPs were obtained to evaluate the vascular anatomy.  CONTRAST:  174mL OMNIPAQUE IOHEXOL 350 MG/ML SOLN. The patient was pre-medicated with Benadryl and Solu-Medrol due to a history of hives with Omnipaque.  COMPARISON:  08/07/2014; 10/18/2011  FINDINGS: Mediastinum/Nodes: No filling defect is identified in the pulmonary arterial tree to suggest pulmonary embolus. Mild atherosclerotic calcification of the aortic arch without acute aortic abnormality identified. No pathologic thoracic adenopathy. Mild cardiomegaly.  Lungs/Pleura: Secondary pulmonary lobular interstitial accentuation noted particularly at the lung apices. Mild atelectasis dependently in both lower lobes. No airspace opacity identified.  Upper abdomen: Unremarkable  Musculoskeletal: Mid thoracic spondylosis and mild kyphosis.  Review of the MIP images confirms the above findings.  IMPRESSION: 1. Mild cardiomegaly with secondary pulmonary lobular interstitial accentuation compatible with mild interstitial edema. 2. No embolus identified.   Electronically Signed   By: Van Clines M.D.   On: 08/07/2014 17:25   Mr Shoulder Left Wo Contrast  08/09/2014  CLINICAL DATA:  Acute onset intractable left shoulder pain 08/03/2014 after are a lifting injury.  EXAM: MRI OF THE LEFT SHOULDER WITHOUT CONTRAST  TECHNIQUE: Multiplanar, multisequence MR imaging of the shoulder was performed. No intravenous contrast was administered.  COMPARISON:  Plain films left shoulder 08/07/2014.  FINDINGS: The study is degraded by patient motion.  Rotator cuff: Supraspinatus worse than infraspinatus tendinopathy without tear is identified.  Muscles:  No atrophy or focal lesion.  Biceps long head:  Intact.  Acromioclavicular Joint: Moderate appearing acromioclavicular osteoarthritis is present. There is fluid within the joint. Intense soft tissue edema  is seen about the joint and surrounding fatty and muscular tissues including the deltoid and superior aspect of the supraspinatus. No focal fluid collection is identified.  Labrum:  Intact.  Bones: As described above. No fracture is identified. The acromion is type 2. There is fluid in the subacromial/subdeltoid bursa.  IMPRESSION: Dominant finding is the abnormal appearance of the acromioclavicular joint highly suspicious for septic joint with surrounding cellulitis and myositis.  Moderate acromioclavicular osteoarthritis.  Mild supraspinatus and infraspinatus tendinopathy without tear.  These results will be called to the ordering clinician or representative by the Radiologist Assistant, and communication documented in the PACS or zVision Dashboard.   Electronically Signed   By: Inge Rise M.D.   On: 08/09/2014 08:48    Scheduled Meds: . aspirin  81 mg Oral Daily  . cyclobenzaprine  5 mg Oral BID  . famotidine  20 mg Oral BID  . ferrous gluconate  324 mg Oral Q breakfast  . glipiZIDE  10 mg Oral BID AC  . hydrALAZINE  25 mg Oral TID  . insulin aspart  0-9 Units Subcutaneous TID WC  . sodium chloride  3 mL Intravenous Q12H   Continuous Infusions:   Principal Problem:   Acute pain of left shoulder Active Problems:   Tachycardia   Sinus tachycardia    Foye Spurling, PA-S [facility] 08/09/2014, 11:48 AM    Bryceton Hantz M. Cruzita Lederer, MD [facility] [phone]

## 2014-08-09 NOTE — Consult Note (Signed)
ORTHOPAEDIC CONSULTATION  REQUESTING PHYSICIAN: Costin Karlyne Greenspan, MD  Chief Complaint: Left shoulder pain  HPI: Brandy Houston is a 59 y.o. female who complains of left shoulder pain for the last few days after doing some lifiting.  Denies any recent illnesses or trauma to the shoulder.  Admitted to hospital for the pain.  Patient has low grade fever with leukocytosis.  MRI is shows myositis and septic AC joint.  Ortho consulted.  Past Medical History  Diagnosis Date  . Diabetes mellitus   . Hypertension   . Peripheral neuropathy    Past Surgical History  Procedure Laterality Date  . Cholecystectomy    . Appendectomy    . Small intestine surgery      Due to Small Bowel Obstruction  . Tubal ligation     History   Social History  . Marital Status: Single    Spouse Name: N/A  . Number of Children: N/A  . Years of Education: N/A   Social History Main Topics  . Smoking status: Current Every Day Smoker -- 0.50 packs/day for 20 years  . Smokeless tobacco: Never Used  . Alcohol Use: Yes     Comment: ocassionally  . Drug Use: No  . Sexual Activity: No   Other Topics Concern  . None   Social History Narrative   Family History  Problem Relation Age of Onset  . Cancer Mother   . Heart disease Father   . Cancer Sister   . Cancer Brother   . Diabetes Brother    Allergies  Allergen Reactions  . Compazine [Prochlorperazine] Shortness Of Breath and Swelling    TONGUE SWELLS  . Omnipaque [Iohexol] Hives  . Shellfish-Derived Products Anaphylaxis  . Iodinated Diagnostic Agents Rash   Prior to Admission medications   Medication Sig Start Date End Date Taking? Authorizing Provider  aspirin 81 MG chewable tablet Chew 81-162 mg by mouth daily as needed (For stroke prevention.).    Yes Historical Provider, MD  Blood Glucose Monitoring Suppl (ON CALL EXPRESS GLUCOSE METER) DEVI Use as directed 01/25/14  Yes Lance Bosch, NP  glipiZIDE (GLUCOTROL) 10 MG tablet Take 1 tablet (10  mg total) by mouth 2 (two) times daily before a meal. 02/27/14  Yes Lance Bosch, NP  glucose blood test strip Use as instructed 01/25/14  Yes Lance Bosch, NP  hydrALAZINE (APRESOLINE) 25 MG tablet Take 1 tablet (25 mg total) by mouth 3 (three) times daily. 02/27/14  Yes Lance Bosch, NP  Menthol-Methyl Salicylate (MUSCLE RUB) 10-15 % CREA Apply 1 application topically daily as needed for muscle pain.   Yes Historical Provider, MD  TRUEPLUS LANCETS 30G MISC Use as directed 01/25/14  Yes Lance Bosch, NP  aspirin EC 81 MG tablet Take 1 tablet (81 mg total) by mouth daily as needed for pain. 04/14/13   Shanker Kristeen Mans, MD   Ct Angio Chest Pe W/cm &/or Wo Cm  08/07/2014   CLINICAL DATA:  Tachycardia and hypoxia. Left shoulder pain for 4 days. Renal insufficiency.  EXAM: CT ANGIOGRAPHY CHEST WITH CONTRAST  TECHNIQUE: Multidetector CT imaging of the chest was performed using the standard protocol during bolus administration of intravenous contrast. Multiplanar CT image reconstructions and MIPs were obtained to evaluate the vascular anatomy.  CONTRAST:  173mL OMNIPAQUE IOHEXOL 350 MG/ML SOLN. The patient was pre-medicated with Benadryl and Solu-Medrol due to a history of hives with Omnipaque.  COMPARISON:  08/07/2014; 10/18/2011  FINDINGS: Mediastinum/Nodes: No filling defect  is identified in the pulmonary arterial tree to suggest pulmonary embolus. Mild atherosclerotic calcification of the aortic arch without acute aortic abnormality identified. No pathologic thoracic adenopathy. Mild cardiomegaly.  Lungs/Pleura: Secondary pulmonary lobular interstitial accentuation noted particularly at the lung apices. Mild atelectasis dependently in both lower lobes. No airspace opacity identified.  Upper abdomen: Unremarkable  Musculoskeletal: Mid thoracic spondylosis and mild kyphosis.  Review of the MIP images confirms the above findings.  IMPRESSION: 1. Mild cardiomegaly with secondary pulmonary lobular interstitial  accentuation compatible with mild interstitial edema. 2. No embolus identified.   Electronically Signed   By: Van Clines M.D.   On: 08/07/2014 17:25   Mr Shoulder Left Wo Contrast  08/09/2014   CLINICAL DATA:  Acute onset intractable left shoulder pain 08/03/2014 after are a lifting injury.  EXAM: MRI OF THE LEFT SHOULDER WITHOUT CONTRAST  TECHNIQUE: Multiplanar, multisequence MR imaging of the shoulder was performed. No intravenous contrast was administered.  COMPARISON:  Plain films left shoulder 08/07/2014.  FINDINGS: The study is degraded by patient motion.  Rotator cuff: Supraspinatus worse than infraspinatus tendinopathy without tear is identified.  Muscles:  No atrophy or focal lesion.  Biceps long head:  Intact.  Acromioclavicular Joint: Moderate appearing acromioclavicular osteoarthritis is present. There is fluid within the joint. Intense soft tissue edema is seen about the joint and surrounding fatty and muscular tissues including the deltoid and superior aspect of the supraspinatus. No focal fluid collection is identified.  Labrum:  Intact.  Bones: As described above. No fracture is identified. The acromion is type 2. There is fluid in the subacromial/subdeltoid bursa.  IMPRESSION: Dominant finding is the abnormal appearance of the acromioclavicular joint highly suspicious for septic joint with surrounding cellulitis and myositis.  Moderate acromioclavicular osteoarthritis.  Mild supraspinatus and infraspinatus tendinopathy without tear.  These results will be called to the ordering clinician or representative by the Radiologist Assistant, and communication documented in the PACS or zVision Dashboard.   Electronically Signed   By: Inge Rise M.D.   On: 08/09/2014 08:48    Positive ROS: All other systems have been reviewed and were otherwise negative with the exception of those mentioned in the HPI and as above.  Physical Exam: General: Alert, no acute distress Cardiovascular: No  pedal edema Respiratory: No cyanosis, no use of accessory musculature GI: No organomegaly, abdomen is soft and non-tender Skin: No lesions in the area of chief complaint Neurologic: Sensation intact distally Psychiatric: Patient is competent for consent with normal mood and affect Lymphatic: No axillary or cervical lymphadenopathy  MUSCULOSKELETAL:  - left shoulder is very painful with any attempted ROM - clavicle nontender - AC joint very tender - pendulum of shoulder is nonpainful - coracoid is tender - no cellulitis - skin is warm to touch  Assessment: Left shoulder pain r/o septic AC joint  Plan: - recommend IR aspiration for cell count, crystals, gram stain, cultures - can start IV abx if patient becomes toxic - NPO after midnight for possible surgery Thursday   Thank you for the consult and the opportunity to see [name]. Eduard Roux, MD [place] 3:43 PM

## 2014-08-09 NOTE — Progress Notes (Signed)
  Echocardiogram 2D Echocardiogram has been performed.  Diamond Nickel 08/09/2014, 12:31 PM

## 2014-08-09 NOTE — Progress Notes (Signed)
Patient's B/P was 193/95. Patient c/o pain. RN to give pt. pain meds. PCP on call was notified.

## 2014-08-09 NOTE — Progress Notes (Signed)
Message sent to MD about abnormal MRI shoulder report

## 2014-08-09 NOTE — Progress Notes (Signed)
ANTIBIOTIC CONSULT NOTE - INITIAL  Pharmacy Consult for vancomycin Indication: septic arthritis of AC joint  Allergies  Allergen Reactions  . Compazine [Prochlorperazine] Shortness Of Breath and Swelling    TONGUE SWELLS  . Omnipaque [Iohexol] Hives  . Shellfish-Derived Products Anaphylaxis  . Iodinated Diagnostic Agents Rash    Patient Measurements: Height: 5' 7.5" (171.5 cm) Weight: 198 lb 14.4 oz (90.22 kg) IBW/kg (Calculated) : 62.75 Adjusted Body Weight: 74kg  Vital Signs: Temp: 99 F (37.2 C) (02/24 1341) Temp Source: Oral (02/24 1341) BP: 167/93 mmHg (02/24 1341) Pulse Rate: 118 (02/24 1341) Intake/Output from previous day: 02/23 0701 - 02/24 0700 In: 1440 [P.O.:1440] Out: 1650 [Urine:1650] Intake/Output from this shift: Total I/O In: 600 [P.O.:600] Out: -   Labs:  Recent Labs  08/07/14 1311 08/08/14 0520  WBC 11.5* 12.5*  HGB 11.6* 11.4*  PLT 201 223  CREATININE 1.66* 1.64*   Estimated Creatinine Clearance: 43 mL/min (by C-G formula based on Cr of 1.64).  Medical History: Past Medical History  Diagnosis Date  . Diabetes mellitus   . Hypertension   . Peripheral neuropathy     Medications:  Scheduled:  . aspirin  81 mg Oral Daily  . cefTRIAXone (ROCEPHIN)  IV  2 g Intravenous Once  . [START ON 08/10/2014] cefTRIAXone (ROCEPHIN)  IV  2 g Intravenous Q24H  . cyclobenzaprine  5 mg Oral BID  . famotidine  20 mg Oral BID  . ferrous gluconate  324 mg Oral Q breakfast  . glipiZIDE  10 mg Oral BID AC  . hydrALAZINE  25 mg Oral TID  . insulin aspart  0-9 Units Subcutaneous TID WC  . sodium chloride  3 mL Intravenous Q12H  . vancomycin  2,000 mg Intravenous Once   Assessment: Brandy Houston w Hx diabetes admitted 2/22 with intractable L shoulder pain. Pt injured her shoulder at work on 2/18. MRI shows septic arthritis of AC joint with associated myositis. ID is following. Pharmacy is consulted to dose vancomycin, MD is dosing ceftriaxone.  Antiinfectives   2/24 >> ceftriaxone >> 2/24 >> vancomycin >>    Labs / vitals Tmax: 100.6 WBCs: 12.5k Renal: SCr 1.64 (baseline unknown, recent levels 1.15-1.66 in Epic), CrCl 43 ml/min CG/N  Microbiology 2/24 blood x2: IP   Goal of Therapy:  Vancomycin trough level 15-20 mcg/ml  Plan:  - vancomycin 2g IV x1 as a loading dose - vancomycin 750mg  IV q12h to start 12h after loading dose given - continue ceftriaxone 2g IV q24h per MD - vancomycin trough at steady state if indicated - follow-up clinical course, culture results, renal function - follow-up antibiotic de-escalation and length of therapy  Thank you for the consult.  Currie Paris, PharmD, BCPS Pager: [phone] Pharmacy: [phone] 08/09/2014 8:17 PM

## 2014-08-10 ENCOUNTER — Encounter (HOSPITAL_COMMUNITY)
Admission: EM | Disposition: A | Discharge status: Rehabilitation Facility | Payer: Self-pay | Source: Home / Self Care | Attending: Internal Medicine

## 2014-08-10 ENCOUNTER — Inpatient Hospital Stay (HOSPITAL_COMMUNITY): Payer: Medicaid Other

## 2014-08-10 ENCOUNTER — Inpatient Hospital Stay (HOSPITAL_COMMUNITY): Payer: Medicaid Other | Admitting: Anesthesiology

## 2014-08-10 DIAGNOSIS — A499 Bacterial infection, unspecified: Secondary | ICD-10-CM

## 2014-08-10 DIAGNOSIS — M25511 Pain in right shoulder: Secondary | ICD-10-CM

## 2014-08-10 DIAGNOSIS — D72829 Elevated white blood cell count, unspecified: Secondary | ICD-10-CM

## 2014-08-10 DIAGNOSIS — M009 Pyogenic arthritis, unspecified: Secondary | ICD-10-CM | POA: Diagnosis present

## 2014-08-10 DIAGNOSIS — N186 End stage renal disease: Secondary | ICD-10-CM | POA: Diagnosis present

## 2014-08-10 DIAGNOSIS — Z992 Dependence on renal dialysis: Secondary | ICD-10-CM

## 2014-08-10 DIAGNOSIS — B9561 Methicillin susceptible Staphylococcus aureus infection as the cause of diseases classified elsewhere: Secondary | ICD-10-CM | POA: Diagnosis present

## 2014-08-10 DIAGNOSIS — R7881 Bacteremia: Secondary | ICD-10-CM | POA: Diagnosis present

## 2014-08-10 HISTORY — PX: SHOULDER ARTHROSCOPY: SHX128

## 2014-08-10 HISTORY — PX: KNEE ARTHROSCOPY: SHX127

## 2014-08-10 LAB — CBC
HCT: 33.6 % — ABNORMAL LOW (ref 36.0–46.0)
Hemoglobin: 11.2 g/dL — ABNORMAL LOW (ref 12.0–15.0)
MCH: 31.2 pg (ref 26.0–34.0)
MCHC: 33.3 g/dL (ref 30.0–36.0)
MCV: 93.6 fL (ref 78.0–100.0)
Platelets: 257 10*3/uL (ref 150–400)
RBC: 3.59 MIL/uL — ABNORMAL LOW (ref 3.87–5.11)
RDW: 13.1 % (ref 11.5–15.5)
WBC: 15 10*3/uL — ABNORMAL HIGH (ref 4.0–10.5)

## 2014-08-10 LAB — SYNOVIAL CELL COUNT + DIFF, W/ CRYSTALS
Crystals, Fluid: NONE SEEN
Lymphocytes-Synovial Fld: 7 % (ref 0–20)
Monocyte-Macrophage-Synovial Fluid: 15 % — ABNORMAL LOW (ref 50–90)
Neutrophil, Synovial: 78 % — ABNORMAL HIGH (ref 0–25)
WBC, Synovial: 68660 /mm3 — ABNORMAL HIGH (ref 0–200)

## 2014-08-10 LAB — HEMOGLOBIN A1C
Hgb A1c MFr Bld: 8.4 % — ABNORMAL HIGH (ref 4.8–5.6)
Mean Plasma Glucose: 194 mg/dL

## 2014-08-10 LAB — BASIC METABOLIC PANEL
Anion gap: 5 (ref 5–15)
BUN: 30 mg/dL — ABNORMAL HIGH (ref 6–23)
CO2: 23 mmol/L (ref 19–32)
Calcium: 7.9 mg/dL — ABNORMAL LOW (ref 8.4–10.5)
Chloride: 100 mmol/L (ref 96–112)
Creatinine, Ser: 1.52 mg/dL — ABNORMAL HIGH (ref 0.50–1.10)
GFR calc Af Amer: 42 mL/min — ABNORMAL LOW (ref >90)
GFR calc non Af Amer: 36 mL/min — ABNORMAL LOW (ref >90)
Glucose, Bld: 159 mg/dL — ABNORMAL HIGH (ref 70–99)
Potassium: 4 mmol/L (ref 3.5–5.1)
Sodium: 128 mmol/L — ABNORMAL LOW (ref 135–145)

## 2014-08-10 LAB — GLUCOSE, CAPILLARY
Glucose-Capillary: 174 mg/dL — ABNORMAL HIGH (ref 70–99)
Glucose-Capillary: 167 mg/dL — ABNORMAL HIGH (ref 70–99)
Glucose-Capillary: 137 mg/dL — ABNORMAL HIGH (ref 70–99)

## 2014-08-10 LAB — HEPATITIS C ANTIBODY: HCV Ab: REACTIVE — AB

## 2014-08-10 LAB — HIV ANTIBODY (ROUTINE TESTING W REFLEX): HIV Screen 4th Generation wRfx: NONREACTIVE

## 2014-08-10 SURGERY — ARTHROSCOPY, KNEE
Anesthesia: General | Laterality: Right

## 2014-08-10 MED ORDER — ACETAMINOPHEN 10 MG/ML IV SOLN: 1000.0000 mg | Freq: Once | INTRAVENOUS | Status: DC

## 2014-08-10 MED ORDER — 0.9 % SODIUM CHLORIDE (POUR BTL) OPTIME
TOPICAL | Status: DC | PRN
  Administered 2014-08-10: 1000 mL

## 2014-08-10 MED ORDER — CEFAZOLIN SODIUM-DEXTROSE 2-3 GM-% IV SOLR
2.0000 g | Freq: Three times a day (TID) | INTRAVENOUS | Status: DC
  Administered 2014-08-10 – 2014-08-14 (×11): 2 g via INTRAVENOUS
  Filled 2014-08-10 (×14): qty 50

## 2014-08-10 MED ORDER — LIDOCAINE HCL 1 % IJ SOLN
INTRAMUSCULAR | Status: AC
  Filled 2014-08-10: qty 20

## 2014-08-10 MED ORDER — SODIUM CHLORIDE 0.9 % IV SOLN
INTRAVENOUS | Status: DC
  Administered 2014-08-10: 50 mL/h via INTRAVENOUS

## 2014-08-10 MED ORDER — VANCOMYCIN HCL 1000 MG IV SOLR
INTRAVENOUS | Status: DC | PRN
  Administered 2014-08-10: 1000 mg

## 2014-08-10 MED ORDER — FENTANYL CITRATE 0.05 MG/ML IJ SOLN
INTRAMUSCULAR | Status: AC
  Filled 2014-08-10: qty 2

## 2014-08-10 MED ORDER — VANCOMYCIN HCL 1000 MG IV SOLR
INTRAVENOUS | Status: AC
  Filled 2014-08-10: qty 1000

## 2014-08-10 MED ORDER — GLYCOPYRROLATE 0.2 MG/ML IJ SOLN
INTRAMUSCULAR | Status: AC
  Filled 2014-08-10: qty 3

## 2014-08-10 MED ORDER — PROPOFOL 10 MG/ML IV BOLUS
INTRAVENOUS | Status: DC | PRN
  Administered 2014-08-10: 150 mg via INTRAVENOUS

## 2014-08-10 MED ORDER — ONDANSETRON HCL 4 MG/2ML IJ SOLN: 4.0000 mg | Freq: Once | INTRAMUSCULAR | Status: AC | PRN

## 2014-08-10 MED ORDER — CISATRACURIUM BESYLATE (PF) 10 MG/5ML IV SOLN
INTRAVENOUS | Status: DC | PRN
  Administered 2014-08-10: 4 mg via INTRAVENOUS
  Administered 2014-08-10: 2 mg via INTRAVENOUS

## 2014-08-10 MED ORDER — LACTATED RINGERS IV SOLN
INTRAVENOUS | Status: DC | PRN
  Administered 2014-08-10 (×2): via INTRAVENOUS

## 2014-08-10 MED ORDER — ONDANSETRON HCL 4 MG/2ML IJ SOLN
INTRAMUSCULAR | Status: DC | PRN
  Administered 2014-08-10: 4 mg via INTRAVENOUS

## 2014-08-10 MED ORDER — SUCCINYLCHOLINE CHLORIDE 20 MG/ML IJ SOLN
INTRAMUSCULAR | Status: DC | PRN
  Administered 2014-08-10: 100 mg via INTRAVENOUS

## 2014-08-10 MED ORDER — POLYETHYLENE GLYCOL 3350 17 G PO PACK
17.0000 g | PACK | Freq: Two times a day (BID) | ORAL | Status: DC
  Administered 2014-08-10 – 2014-09-13 (×28): 17 g via ORAL
  Filled 2014-08-10 (×72): qty 1

## 2014-08-10 MED ORDER — ACETAMINOPHEN 10 MG/ML IV SOLN
1000.0000 mg | Freq: Once | INTRAVENOUS | Status: AC
  Administered 2014-08-10: 1000 mg via INTRAVENOUS
  Filled 2014-08-10 (×2): qty 100

## 2014-08-10 MED ORDER — GLYCOPYRROLATE 0.2 MG/ML IJ SOLN
INTRAMUSCULAR | Status: DC | PRN
  Administered 2014-08-10: 0.4 mg via INTRAVENOUS

## 2014-08-10 MED ORDER — ONDANSETRON HCL 4 MG/2ML IJ SOLN
INTRAMUSCULAR | Status: AC
  Filled 2014-08-10: qty 2

## 2014-08-10 MED ORDER — PHENYLEPHRINE HCL 10 MG/ML IJ SOLN
INTRAMUSCULAR | Status: DC | PRN
  Administered 2014-08-10 (×2): 80 ug via INTRAVENOUS

## 2014-08-10 MED ORDER — PHENYLEPHRINE 40 MCG/ML (10ML) SYRINGE FOR IV PUSH (FOR BLOOD PRESSURE SUPPORT)
PREFILLED_SYRINGE | INTRAVENOUS | Status: AC
  Filled 2014-08-10: qty 10

## 2014-08-10 MED ORDER — CISATRACURIUM BESYLATE 20 MG/10ML IV SOLN
INTRAVENOUS | Status: AC
  Filled 2014-08-10: qty 10

## 2014-08-10 MED ORDER — FENTANYL CITRATE 0.05 MG/ML IJ SOLN
25.0000 ug | INTRAMUSCULAR | Status: DC | PRN
  Administered 2014-08-10: 50 ug via INTRAVENOUS
  Administered 2014-08-11: 25 ug via INTRAVENOUS
  Administered 2014-08-11 (×2): 50 ug via INTRAVENOUS
  Filled 2014-08-10 (×2): qty 2

## 2014-08-10 MED ORDER — PROPOFOL 10 MG/ML IV BOLUS
INTRAVENOUS | Status: AC
  Filled 2014-08-10: qty 20

## 2014-08-10 MED ORDER — SODIUM CHLORIDE 0.9 % IR SOLN
Status: DC | PRN
  Administered 2014-08-10: 6000 mL

## 2014-08-10 MED ORDER — NEOSTIGMINE METHYLSULFATE 10 MG/10ML IV SOLN
INTRAVENOUS | Status: DC | PRN
  Administered 2014-08-10: 3 mg via INTRAVENOUS

## 2014-08-10 MED ORDER — LACTATED RINGERS IR SOLN
Status: DC | PRN
  Administered 2014-08-10: 9000 mL

## 2014-08-10 MED ORDER — FENTANYL CITRATE 0.05 MG/ML IJ SOLN
INTRAMUSCULAR | Status: DC | PRN
  Administered 2014-08-10 (×3): 25 ug via INTRAVENOUS
  Administered 2014-08-10: 50 ug via INTRAVENOUS
  Administered 2014-08-10: 25 ug via INTRAVENOUS
  Administered 2014-08-10: 50 ug via INTRAVENOUS

## 2014-08-10 MED ORDER — NEOSTIGMINE METHYLSULFATE 10 MG/10ML IV SOLN
INTRAVENOUS | Status: AC
  Filled 2014-08-10: qty 1

## 2014-08-10 SURGICAL SUPPLY — 69 items
BANDAGE ELASTIC 6 VELCRO ST LF (GAUZE/BANDAGES/DRESSINGS) ×3 IMPLANT
BLADE CUDA SHAVER 3.5 (BLADE) ×3 IMPLANT
BLADE SURG SZ11 CARB STEEL (BLADE) ×3 IMPLANT
BUR OVAL 4.0 (BURR) IMPLANT
CHLORAPREP W/TINT 26ML (MISCELLANEOUS) ×12 IMPLANT
CLOTH BEACON ORANGE TIMEOUT ST (SAFETY) ×3 IMPLANT
COUNTER NEEDLE 20 DBL MAG RED (NEEDLE) IMPLANT
COVER SURGICAL LIGHT HANDLE (MISCELLANEOUS) ×3 IMPLANT
CUFF TOURN SGL QUICK 34 (TOURNIQUET CUFF) ×1
CUFF TRNQT CYL 34X4X40X1 (TOURNIQUET CUFF) ×2 IMPLANT
DRAPE INCISE IOBAN 66X45 STRL (DRAPES) IMPLANT
DRAPE ORTHO SPLIT 77X108 STRL (DRAPES)
DRAPE POUCH INSTRU U-SHP 10X18 (DRAPES) ×9 IMPLANT
DRAPE SHOULDER BEACH CHAIR (DRAPES) IMPLANT
DRAPE STERI 35X30 U-POUCH (DRAPES) ×3 IMPLANT
DRAPE SURG 17X11 SM STRL (DRAPES) ×6 IMPLANT
DRAPE SURG ORHT 6 SPLT 77X108 (DRAPES) IMPLANT
DRAPE U-SHAPE 47X51 STRL (DRAPES) ×9 IMPLANT
DRSG EMULSION OIL 3X3 NADH (GAUZE/BANDAGES/DRESSINGS) IMPLANT
DRSG MEPILEX BORDER 4X4 (GAUZE/BANDAGES/DRESSINGS) ×6 IMPLANT
DRSG PAD ABDOMINAL 8X10 ST (GAUZE/BANDAGES/DRESSINGS) ×6 IMPLANT
DRSG XEROFORM 1X8 (GAUZE/BANDAGES/DRESSINGS) ×3 IMPLANT
DURAPREP 26ML APPLICATOR (WOUND CARE) IMPLANT
ELECT REM PT RETURN 9FT ADLT (ELECTROSURGICAL) ×3
ELECTRODE REM PT RTRN 9FT ADLT (ELECTROSURGICAL) ×2 IMPLANT
EVACUATOR 1/8 PVC DRAIN (DRAIN) ×3 IMPLANT
GAUZE SPONGE 4X4 12PLY STRL (GAUZE/BANDAGES/DRESSINGS) ×3 IMPLANT
GAUZE SPONGE 4X4 16PLY XRAY LF (GAUZE/BANDAGES/DRESSINGS) ×6 IMPLANT
GLOVE BIO SURGEON STRL SZ7.5 (GLOVE) IMPLANT
GLOVE BIOGEL PI IND STRL 7.5 (GLOVE) ×2 IMPLANT
GLOVE BIOGEL PI IND STRL 8 (GLOVE) ×2 IMPLANT
GLOVE BIOGEL PI INDICATOR 7.5 (GLOVE) ×1
GLOVE BIOGEL PI INDICATOR 8 (GLOVE) ×1
GLOVE ORTHO TXT STRL SZ7.5 (GLOVE) ×3 IMPLANT
GOWN STRL REUS W/TWL LRG LVL3 (GOWN DISPOSABLE) ×3 IMPLANT
GOWN STRL REUS W/TWL XL LVL3 (GOWN DISPOSABLE) ×3 IMPLANT
HANDPIECE INTERPULSE COAX TIP (DISPOSABLE) ×2
KIT BASIN OR (CUSTOM PROCEDURE TRAY) ×6 IMPLANT
KIT STIMULAN RAPID CURE  10CC (Orthopedic Implant) ×1 IMPLANT
KIT STIMULAN RAPID CURE 10CC (Orthopedic Implant) ×2 IMPLANT
MANIFOLD NEPTUNE II (INSTRUMENTS) ×3 IMPLANT
MARKER PEN SURG W/LABELS BLK (STERILIZATION PRODUCTS) ×3 IMPLANT
NEEDLE HYPO 25X1 1.5 SAFETY (NEEDLE) IMPLANT
NEEDLE SCORPION (NEEDLE) IMPLANT
NEEDLE SPNL 18GX3.5 QUINCKE PK (NEEDLE) ×3 IMPLANT
PACK ARTHROSCOPY WL (CUSTOM PROCEDURE TRAY) IMPLANT
PACK SHOULDER (CUSTOM PROCEDURE TRAY) ×6 IMPLANT
PAD MASON LEG HOLDER (PIN) IMPLANT
PADDING CAST COTTON 6X4 STRL (CAST SUPPLIES) ×3 IMPLANT
POSITIONER SURGICAL ARM (MISCELLANEOUS) ×3 IMPLANT
RESECTOR FULL RADIUS 4.2MM (BLADE) IMPLANT
SET ARTHROSCOPY TUBING (MISCELLANEOUS) ×2
SET ARTHROSCOPY TUBING LN (MISCELLANEOUS) ×4 IMPLANT
SET HNDPC FAN SPRY TIP SCT (DISPOSABLE) ×4 IMPLANT
SLING ARM IMMOBILIZER LRG (SOFTGOODS) IMPLANT
SLING ARM IMMOBILIZER MED (SOFTGOODS) IMPLANT
SLING ARM LRG ADULT FOAM STRAP (SOFTGOODS) IMPLANT
SLING ARM MED ADULT FOAM STRAP (SOFTGOODS) IMPLANT
SUT ETHILON 3 0 PS 1 (SUTURE) ×9 IMPLANT
SUT ETHILON 4 0 PS 2 18 (SUTURE) IMPLANT
SUT MON AB 2-0 CT1 36 (SUTURE) ×6 IMPLANT
SUT PDS AB 0 CT1 36 (SUTURE) ×3 IMPLANT
SUT PDS AB 1 CT1 27 (SUTURE) IMPLANT
SYR 30ML LL (SYRINGE) ×3 IMPLANT
SYR CONTROL 10ML LL (SYRINGE) ×3 IMPLANT
TOWEL OR 17X26 10 PK STRL BLUE (TOWEL DISPOSABLE) ×3 IMPLANT
TOWEL OR NON WOVEN STRL DISP B (DISPOSABLE) ×3 IMPLANT
WAND 90 DEG TURBOVAC W/CORD (SURGICAL WAND) ×3 IMPLANT
WRAP KNEE MAXI GEL POST OP (GAUZE/BANDAGES/DRESSINGS) ×3 IMPLANT

## 2014-08-10 NOTE — Progress Notes (Signed)
CRITICAL VALUE ALERT  Critical value received:  Gram Positive Cocci in clusters  Date of notification:  08/10/2014  Time of notification:  0657  Critical value read back:Yes.    Nurse who received alert:  J.Jyra Lagares  MD notified (1st page): Tylene Fantasia  Time of first page:  [phone]  MD notified (2nd page):  Time of second page:  Responding MD:  N/A  Time MD responded:  N/A

## 2014-08-10 NOTE — Op Note (Addendum)
Date of surgery: 07/31/2014  Preoperative diagnoses: 1. Septic arthritis right knee 2. Septic arthritis right acromioclavicular joint 3. Septic arthritis left acromioclavicular joint 4. Abscess of left shoulder 5. Abscess of right shoulder  Postoperative diagnosis: Same  Procedure: 1. Arthroscopic irrigation and debridement of right knee with extensive synovectomy in 3 compartments. 2. Arthrotomy of right acromioclavicular joint 3. Debridement of bone, subcutaneous tissue, muscle of right shoulder region 8 cm x 5 cm. 4. Arthrotomy of left acromioclavicular joint.pen irrigation and debridement of subcutaneous tissue, muscle of left shoulder abscess 15 cm x 10 cm  Surgeon: Eduard Roux, M.D.  Anesthesia: Gen.  Estimated blood loss: Minimal  Specimens: 2 from the left shoulder abscess  Drains: One medium Hemovac, right knee  Indications for procedure: Brandy Houston is a 59 year old female who has bacteremia and multiple septic joints and abscesses along with myositis. Workup and imaging were consistent with the above findings. The decision was made to bring her to the operating room for formal I and D of her conditions. She was made aware of her risks, benefits, and alternatives to surgery and she wished to proceed. She signed the consent.  Description of procedure: The patient was identified in the preoperative holding area. The operative sites were marked by the surgeon confirmed with the patient. She is brought back to the operating room. She was placed supine on the operating room table. General anesthesia was induced. She was given her regularly dosed antibiotics given the fact that she was on the sepsis protocol. A timeout was performed. We first began with the arthroscopic I and D of the right knee. The right lower extremity was prepped and draped in standard sterile fashion. We established the standard anterolateral anteromedial portals to the knee. There was frank pus from the knee  joint. We then used the oscillating shaver to perform the arthroscopic irrigation debridement of the right knee. An extensive synovectomy was performed in all 3 compartments. Once we felt the debridement was adequate we placed a medium Hemovac drain through the anterolateral portal under arthroscopic visualization. The drain was sutured in. The portals were closed with interrupted nylon sutures. Sterile dressings were applied. We then repositioned the patient in the beachchair position. We prepped and draped the right upper extremity in standard sterile fashion. Another timeout was performed. We made a longitudinal incision over the before meals joint. Full-thickness flaps were created. We incised the before meals joint capsule and there was a scant amount of frank pus from the joint. The joint was thoroughly debrided using a rongeur. Sharp excisional debridement of the joint capsule was carried out.  Sharp excisional debridement with a rongeur of bone, muscle, subcutaneous tissue of the right shoulder abscess was carried out. Care was taken not to destabilize the acromioclavicular joint. The joint was then thoroughly irrigated using pulse lavage. Once this was done the wound was closed in layer fashion using 0 PDS for the fascia, 2-0 Monocryl for the deep skin layer and 3-0 nylon for the skin. Sterile dressings were applied. We then prepped and draped the left upper extremity in standard sterile fashion. Timeout was performed again. A longitudinal incision over the acromioclavicular joint was used.  Blunt dissection was taken down to the level of the fascia. The fascia fascia was sharply incised in line with the incision. The before meals joint was exposed.  In this area there was a large subcutaneous abscess that was circumferential. There was also undermining. Sharp excisional debridement of the fat and muscle was performed  using a rongeur. 2 cultures were taken. We then incised the acromioclavicular joint and  there was also a significant amount of frank pus. Sharp excisional debridement of the bone, muscle, subcutaneous tissue was carried out. Once this was done with we thoroughly irrigated the wound with pulse lavage. 10 mL of stimulant beads impregnated with vancomycin powder was placed in the wound bed. The wound was closed in layer fashion using 0 PDS for the fascia, 2-0 Vicryl to Monocryl for the deep skin layer and 3-0 nylon for the skin. Sterile dressings were applied. The patient tolerated the procedure well was x-rayed and transferred to the PACU in stable condition.  Postoperative plan: Patient will be weightbearing as tolerated to all of her extremities. We will follow-up on the cultures. She is to continue IV antibiotics. We will follow her for clinical improvement. We will remove the drain when it is appropriate.   Brandy Cecil, MD [place] 9:57 PM

## 2014-08-10 NOTE — Progress Notes (Signed)
[facility]    Date of Admission:  08/07/2014   Total days of antibiotics 2        Day 2 ceftriaxone        Day 2 vancomycin           ID: Brandy Houston is a 59 y.o. female with left shoulder pain, now found to have bacteremia, concerning for septic arthritis with secondary gram positive bacteremia but also right shoulder pain Principal Problem:   Acute pain of left shoulder Active Problems:   Tachycardia   Sinus tachycardia    Subjective: High fever of 101.54F last night. Had aspirate of left shoulder this morning. Blood cx showing 2 sets with gpcc. Still guarding of right arm/shoulder due to pain  Medications:  . aspirin  81 mg Oral Daily  .  ceFAZolin (ANCEF) IV  2 g Intravenous 3 times per day  . cyclobenzaprine  5 mg Oral BID  . famotidine  20 mg Oral BID  . ferrous gluconate  324 mg Oral Q breakfast  . glipiZIDE  10 mg Oral BID AC  . hydrALAZINE  25 mg Oral TID  . insulin aspart  0-9 Units Subcutaneous TID WC  . lidocaine      . polyethylene glycol  17 g Oral BID  . sodium chloride  3 mL Intravenous Q12H  . vancomycin  750 mg Intravenous Q12H    Objective: Vital signs in last 24 hours: Temp:  [97.8 F (36.6 C)-101.8 F (38.8 C)] 98.7 F (37.1 C) (02/25 1332) Pulse Rate:  [108-125] 117 (02/25 1332) Resp:  [18-20] 18 (02/25 1332) BP: (122-179)/(63-87) 165/87 mmHg (02/25 1332) SpO2:  [94 %-99 %] 94 % (02/25 1332) Physical Exam  Constitutional:  oriented to person, place, and time. appears well-developed and well-nourished. No distress.  HENT:  Mouth/Throat: Oropharynx is clear and moist. No oropharyngeal exudate.  Cardiovascular: Normal rate, regular rhythm and normal heart sounds. Exam reveals no gallop and no friction rub.  No murmur heard.  Pulmonary/Chest: Effort normal and breath sounds normal. No respiratory distress.  has no wheezes.  Abdominal: Soft. Bowel sounds are normal.  exhibits no distension. There is no tenderness.    Lymphadenopathy: no cervical adenopathy.  Neurological: alert and oriented to person, place, and time.  Skin: Skin is warm and dry. No rash noted. No erythema.  Ext: wearing left arm sling. Right shoulder still guarding with any movement.tender to palpation at shoulder joint Psychiatric: a normal mood and affect. behavior is normal.    Lab Results  Recent Labs  08/08/14 0520 08/10/14 0450  WBC 12.5* 15.0*  HGB 11.4* 11.2*  HCT 35.3* 33.6*  NA 134* 128*  K 4.3 4.0  CL 103 100  CO2 23 23  BUN 29* 30*  CREATININE 1.64* 1.52*   Liver Panel No results for input(s): PROT, ALBUMIN, AST, ALT, ALKPHOS, BILITOT, BILIDIR, IBILI in the last 72 hours. Sedimentation Rate  Recent Labs  08/09/14 1520  ESRSEDRATE 120*   C-Reactive Protein  Recent Labs  08/09/14 1520  CRP 18.3*    Microbiology: 2/24 blood cx 2/2 GPCC Studies/Results: Mr Shoulder Left Wo Contrast  08/09/2014   CLINICAL DATA:  Acute onset intractable left shoulder pain 08/03/2014 after are a lifting injury.  EXAM: MRI OF THE LEFT SHOULDER WITHOUT CONTRAST  TECHNIQUE: Multiplanar, multisequence MR imaging of the shoulder was performed. No intravenous contrast was administered.  COMPARISON:  Plain films left shoulder 08/07/2014.  FINDINGS: The study is degraded  by patient motion.  Rotator cuff: Supraspinatus worse than infraspinatus tendinopathy without tear is identified.  Muscles:  No atrophy or focal lesion.  Biceps long head:  Intact.  Acromioclavicular Joint: Moderate appearing acromioclavicular osteoarthritis is present. There is fluid within the joint. Intense soft tissue edema is seen about the joint and surrounding fatty and muscular tissues including the deltoid and superior aspect of the supraspinatus. No focal fluid collection is identified.  Labrum:  Intact.  Bones: As described above. No fracture is identified. The acromion is type 2. There is fluid in the subacromial/subdeltoid bursa.  IMPRESSION: Dominant  finding is the abnormal appearance of the acromioclavicular joint highly suspicious for septic joint with surrounding cellulitis and myositis.  Moderate acromioclavicular osteoarthritis.  Mild supraspinatus and infraspinatus tendinopathy without tear.  These results will be called to the ordering clinician or representative by the Radiologist Assistant, and communication documented in the PACS or zVision Dashboard.   Electronically Signed   By: Inge Rise M.D.   On: 08/09/2014 08:48   Ir Fluoro Guide Ndl Plmt / Bx  08/10/2014   CLINICAL DATA:  59 year old female with left shoulder pain and evidence on recent MRI of septic arthritis of acromioclavicular joint. She has been referred for evaluation of aspiration.  EXAM: ULTRASOUND AND FLUOROSCOPIC GUIDED LEFT ACROMIOCLAVICULAR JOINT ASPIRATION.  TECHNIQUE: The procedure, risks, benefits, and alternatives were explained to the patient. Questions regarding the procedure were encouraged and answered. The patient understands and consents to the procedure.  Fluoroscopic image of the left shoulder performed. Ultrasound survey of the shoulder performed. Image was stored and sent to PACs with both modality.  Using fluoroscopy, the skin and subcutaneous tissues overlying the left acromioclavicular joint were generously infiltrated 1% lidocaine for local anesthesia.  We then used ultrasound guidance to advance an 18 gauge needle attached to a syringe into the left acromioclavicular joint fluid. Two separate aspiration were performed. Approximately 1-2 cc of fluid were aspirated.  Samples were sent to the lab for analysis.  Patient tolerated the procedure well and remained hemodynamically stable throughout.  No complications were encountered and no significant blood loss was encountered.  COMPLICATIONS: None  IMPRESSION: Status post fluoroscopic and ultrasound-guided left AC joint aspiration with sample sent to the lab for analysis.  Signed,  Dulcy Fanny. Earleen [place], DO   [facility]  [facility]   Electronically Signed   By: Corrie Mckusick D.O.   On: 08/10/2014 12:07     Assessment/Plan: Septic arthritis of left shoulder = cell count unable to be done due to specimen clotting. Specimen was sent for culture. Given that her blood cx are positive, very highly suspicous for staph aureus septic arthritis. If that is the case, she would benefit from washout.  Right shoulder pain = concern that she may have more than 1 joint involvement due to her having bacteremia. Recommend to either image her right shoulder or have US guided arthrocentesis of right shoulder as well.  gpcc bacteremia = will change antibiotics to vancomycin and cefazolin.discontinue ceftriaxone to cover MSSA vs. MRSA. She had TTE that did not show vegetation. Will likely treat for 6 wk due to septic arthritis.  Leukocytosis and fever = from infection, just started treatment yesterday  Baxter Flattery [facility] Cell: [phone] Pager: [phone]  08/10/2014, 2:38 PM

## 2014-08-10 NOTE — Transfer of Care (Signed)
Immediate Anesthesia Transfer of Care Note  Patient: Brandy Houston  Procedure(s) Performed: Procedure(s): ARTHROSCOPY I & D KNEE (Right) I & D BILATERAL SHOULDERS  (Bilateral)  Patient Location: PACU  Anesthesia Type:General  Level of Consciousness: awake and patient cooperative  Airway & Oxygen Therapy: Patient Spontanous Breathing and Patient connected to face mask oxygen  Post-op Assessment: Report given to RN and Post -op Vital signs reviewed and stable  Post vital signs: Reviewed and stable  Last Vitals:  Filed Vitals:   08/10/14 1332  BP: 165/87  Pulse: 117  Temp: 37.1 C  Resp: 18    Complications: No apparent anesthesia complications

## 2014-08-10 NOTE — Procedures (Signed)
Interventional Radiology Procedure Note  Procedure: [place] / Fluoro guided left AC joint aspiration, for concern of septic joint.  1-2 cc fluid aspirated in 2 samples.   Complications: No immediate Recommendations:   - Routine care   Signed,  Dulcy Fanny. Earleen [place], DO

## 2014-08-10 NOTE — Anesthesia Procedure Notes (Signed)
Procedure Name: Intubation Date/Time: 08/10/2014 7:28 PM Performed by: Dione Booze Pre-anesthesia Checklist: Emergency Drugs available, Patient identified, Suction available and Patient being monitored Patient Re-evaluated:Patient Re-evaluated prior to inductionOxygen Delivery Method: Circle system utilized Preoxygenation: Pre-oxygenation with 100% oxygen Intubation Type: IV induction and Rapid sequence Grade View: Grade I Tube type: Oral Tube size: 7.5 mm Number of attempts: 1 Airway Equipment and Method: Stylet Placement Confirmation: ETT inserted through vocal cords under direct vision,  breath sounds checked- equal and bilateral and positive ETCO2 Secured at: 21 cm Tube secured with: Tape (very poor dentition, pt aware of possiblity of knocking tooth out.) Dental Injury: Teeth and Oropharynx as per pre-operative assessment

## 2014-08-10 NOTE — Anesthesia Postprocedure Evaluation (Signed)
  Anesthesia Post-op Note  Patient: Brandy Houston  Procedure(s) Performed: Procedure(s) (LRB): ARTHROSCOPY I & D KNEE (Right) I & D BILATERAL SHOULDERS  (Bilateral)  Patient Location: PACU  Anesthesia Type: General  Level of Consciousness: awake and alert   Airway and Oxygen Therapy: Patient Spontanous Breathing  Post-op Pain: mild  Post-op Assessment: Post-op Vital signs reviewed, Patient's Cardiovascular Status Stable, Respiratory Function Stable, Patent Airway and No signs of Nausea or vomiting  Last Vitals:  Filed Vitals:   08/10/14 2158  BP:   Pulse:   Temp: 37.3 C  Resp: 16    Post-op Vital Signs: stable   Complications: No apparent anesthesia complications, agitation upon emergence, patient now calm, alert and oriented to person, place, and time.

## 2014-08-10 NOTE — Anesthesia Preprocedure Evaluation (Addendum)
Anesthesia Evaluation  Patient identified by MRN, date of birth, ID band Patient awake    Reviewed: Allergy & Precautions, NPO status , Patient's Chart, lab work & pertinent test results  History of Anesthesia Complications Negative for: history of anesthetic complications  Airway Mallampati: II  TM Distance: >3 FB Neck ROM: Full    Dental no notable dental hx. (+) Dental Advisory Given, Poor Dentition, Loose, Chipped, Missing,    Pulmonary Current Smoker,  breath sounds clear to auscultation  Pulmonary exam normal       Cardiovascular hypertension, Pt. on medications Rhythm:Regular Rate:Normal     Neuro/Psych negative neurological ROS  negative psych ROS   GI/Hepatic negative GI ROS, Neg liver ROS,   Endo/Other  diabetes, Type 2, Oral Hypoglycemic Agents  Renal/GU ARFRenal disease  negative genitourinary   Musculoskeletal  (+) Arthritis -, Osteoarthritis,    Abdominal   Peds negative pediatric ROS (+)  Hematology negative hematology ROS (+)   Anesthesia Other Findings Septic joint  Reproductive/Obstetrics negative OB ROS                            Anesthesia Physical Anesthesia Plan  ASA: III and emergent  Anesthesia Plan: General   Post-op Pain Management:    Induction: Intravenous  Airway Management Planned: Oral ETT  Additional Equipment:   Intra-op Plan:   Post-operative Plan: Extubation in OR  Informed Consent: I have reviewed the patients History and Physical, chart, labs and discussed the procedure including the risks, benefits and alternatives for the proposed anesthesia with the patient or authorized representative who has indicated his/her understanding and acceptance.   Dental advisory given  Plan Discussed with: CRNA  Anesthesia Plan Comments:         Anesthesia Quick Evaluation

## 2014-08-10 NOTE — Progress Notes (Signed)
PROGRESS NOTE  Brandy Houston [id number] DOB: 02/14/1956 DOA: 08/07/2014 PCP: Chari Manning, NP  HPI: Brandy Houston is a 59 yo African American female who has a history of Diabetes, hypertension, and peripheral neuropathy. She presented to [facility] ED on 2/22 with intractable left shoulder pain. 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 On Friday she could not longer lift her arm due to her pain level. She used Aleve, Bengay, and her daughter's muscle relaxers with minimal relief. On 2/22 pain was localized to left shoulder with significantly reduced ROM.   Subjective She continues to have pain, complaints of her right shoulder hurting more as well as right knee, she is having difficulties to bend her right knee and walking   Assessment/Plan:  Acute Left shoulder pain / concern for septic arthritis AC joint - shoulder pain started on 2/19, with remarkable decrease in ROM  - 2/23 MRI of left shoulder shows septic AC joint - consulted orthopedic surgery as well as infectious disease, appreciate input - Pain control   - The patient with worsening pain on the right shoulder, with swelling, as well as swelling of the right knee, she is having a hard time bending the right knee and difficulty with walking. Also complains of right hand swelling - Discussed with Dr. [name], will need washout, will transfer to [facility] - antibiotics per ID, now on Vancomycin and Ancef  Gram positive bacteremia - already on antibiotics, afebrile, normotensive - await speciation  Sinus Tachycardia  - due to #1  - Mostly likely attributed to significant joint pain - no arrhythmias, discontinue telemetry   Chronic renal failure, CKD II-III - Creatinine is at 1.64, baseline 1.15 - Stable since admission  Anemia - Hb 11.4 on 2/23; stable now - Continue iron supplements  Diabetes, uncontrolled - Latest CBG was 194 - SSI and glipizide  - repeat  A1C  Hypertension - continue home medications  DVT Prophylaxis:  SCD   Code Status: Full  Family Communication: Discussed with patient Disposition Plan: Inpatient, [facility] transfer for ortho OR  Consultants:  Orthopedic surgery   ID  Procedures:  None  Antibiotics:  Vancomycin 2/24 >>  Ceftriaxone 2/24 >> 2/25  Ancef 2/25 >>  Objective: Filed Vitals:   08/09/14 2212 08/10/14 0532 08/10/14 0600 08/10/14 1332  BP: 122/63 157/85 139/75 165/87  Pulse: 113 108  117  Temp: 99.2 F (37.3 C) 97.8 F (36.6 C)  98.7 F (37.1 C)  TempSrc: Oral Oral  Oral  Resp: 20 19  18   Height:      Weight:      SpO2: 99%   94%    Intake/Output Summary (Last 24 hours) at 08/10/14 1634 Last data filed at 08/10/14 1300  Gross per 24 hour  Intake    630 ml  Output   2100 ml  Net  -1470 ml   Filed Weights   08/07/14 2009  Weight: 90.22 kg (198 lb 14.4 oz)   Exam: General: NAD, appears in pain HEENT:  EOMI, Anicteic Sclera, MMM.  Neck: Supple, no JVD, no masses  Cardiovascular: Tachycardic with regular rhythm, S1 S2 auscultated, no rubs, murmurs or gallops.   Respiratory: Clear to auscultation bilaterally with equal chest rise  Abdomen: Soft, nontender, nondistended, + bowel sounds  Extremities: warm dry without cyanosis clubbing or edema. Decreased ROM bilateral shoulders due to pain, right knee swollen, not tender to palpation however tender with movement Neuro:  non focal  Skin: Without rashes exudates or nodules.   Psych: Normal affect and demeanor with intact judgement and insight  Data Reviewed: Basic Metabolic Panel:  Recent Labs Lab 08/07/14 1311 08/08/14 0520 08/10/14 0450  NA 131* 134* 128*  K 3.7 4.3 4.0  CL 96 103 100  CO2 27 23 23   GLUCOSE 186* 281* 159*  BUN 24* 29* 30*  CREATININE 1.66* 1.64* 1.52*  CALCIUM 8.4 8.5 7.9*   CBC:  Recent Labs Lab 08/07/14 1311 08/08/14 0520 08/10/14 0450  WBC 11.5* 12.5* 15.0*  NEUTROABS 10.6*  --   --   HGB  11.6* 11.4* 11.2*  HCT 34.7* 35.3* 33.6*  MCV 94.0 94.9 93.6  PLT 201 223 257   Cardiac Enzymes:  Recent Labs Lab 08/08/14 0020 08/08/14 0520  TROPONINI 0.09* 0.08*   CBG:  Recent Labs Lab 08/09/14 1151 08/09/14 1652 08/09/14 2129 08/10/14 0734 08/10/14 1319  GLUCAP 194* 240* 221* 174* 167*   Studies: Mr Shoulder Left Wo Contrast  08/09/2014   CLINICAL DATA:  Acute onset intractable left shoulder pain 08/03/2014 after are a lifting injury.  EXAM: MRI OF THE LEFT SHOULDER WITHOUT CONTRAST  TECHNIQUE: Multiplanar, multisequence MR imaging of the shoulder was performed. No intravenous contrast was administered.  COMPARISON:  Plain films left shoulder 08/07/2014.  FINDINGS: The study is degraded by patient motion.  Rotator cuff: Supraspinatus worse than infraspinatus tendinopathy without tear is identified.  Muscles:  No atrophy or focal lesion.  Biceps long head:  Intact.  Acromioclavicular Joint: Moderate appearing acromioclavicular osteoarthritis is present. There is fluid within the joint. Intense soft tissue edema is seen about the joint and surrounding fatty and muscular tissues including the deltoid and superior aspect of the supraspinatus. No focal fluid collection is identified.  Labrum:  Intact.  Bones: As described above. No fracture is identified. The acromion is type 2. There is fluid in the subacromial/subdeltoid bursa.  IMPRESSION: Dominant finding is the abnormal appearance of the acromioclavicular joint highly suspicious for septic joint with surrounding cellulitis and myositis.  Moderate acromioclavicular osteoarthritis.  Mild supraspinatus and infraspinatus tendinopathy without tear.  These results will be called to the ordering clinician or representative by the Radiologist Assistant, and communication documented in the PACS or zVision Dashboard.   Electronically Signed   By: Inge Rise M.D.   On: 08/09/2014 08:48   Ir Fluoro Guide Ndl Plmt / Bx  08/10/2014    CLINICAL DATA:  59 year old female with left shoulder pain and evidence on recent MRI of septic arthritis of acromioclavicular joint. She has been referred for evaluation of aspiration.  EXAM: ULTRASOUND AND FLUOROSCOPIC GUIDED LEFT ACROMIOCLAVICULAR JOINT ASPIRATION.  TECHNIQUE: The procedure, risks, benefits, and alternatives were explained to the patient. Questions regarding the procedure were encouraged and answered. The patient understands and consents to the procedure.  Fluoroscopic image of the left shoulder performed. Ultrasound survey of the shoulder performed. Image was stored and sent to PACs with both modality.  Using fluoroscopy, the skin and subcutaneous tissues overlying the left acromioclavicular joint were generously infiltrated 1% lidocaine for local anesthesia.  We then used ultrasound guidance to advance an 18 gauge needle attached to a syringe into the left acromioclavicular joint fluid. Two separate aspiration were performed. Approximately 1-2 cc of fluid were aspirated.  Samples were sent to the lab for analysis.  Patient tolerated the procedure well and remained hemodynamically stable throughout.  No complications were encountered and no significant blood loss was encountered.  COMPLICATIONS: None  IMPRESSION: Status post fluoroscopic and ultrasound-guided left AC joint aspiration with sample sent to the lab for analysis.  Signed,  Dulcy Fanny. Earleen Newport, DO  [facility]  [facility]   Electronically Signed   By: Corrie Mckusick D.O.   On: 08/10/2014 12:07    Scheduled Meds: . aspirin  81 mg Oral Daily  .  ceFAZolin (ANCEF) IV  2 g Intravenous 3 times per day  . cyclobenzaprine  5 mg Oral BID  . famotidine  20 mg Oral BID  . ferrous gluconate  324 mg Oral Q breakfast  . glipiZIDE  10 mg Oral BID AC  . hydrALAZINE  25 mg Oral TID  . insulin aspart  0-9 Units Subcutaneous TID WC  . lidocaine      . polyethylene glycol  17 g Oral BID  . sodium  chloride  3 mL Intravenous Q12H  . vancomycin  750 mg Intravenous Q12H   Continuous Infusions: . sodium chloride 50 mL/hr (08/10/14 1207)    Principal Problem:   Acute pain of left shoulder Active Problems:   Tachycardia   Sinus tachycardia  Costin M. Cruzita Lederer, MD [facility] [phone] 08/10/2014, 4:34 PM

## 2014-08-10 NOTE — Progress Notes (Signed)
Received from IR  Post left AC aspiration , asp site dry intact, no signs of bleeding noted.

## 2014-08-10 NOTE — Progress Notes (Signed)
Right knee aspirated with >100 cc of turbid fluid worrisome for infection.  Continue NPO.  [facility].  Will likely need I&D of both AC joints and right knee.  Will await the results of the knee aspirate.  Febrile to 101.8 today.  WBC trending up.  CRP, ESR both elevated.  Bacteremic with GPC.  Continue IV abx.  Azucena Cecil, MD [facility] [phone] 5:20 PM

## 2014-08-11 ENCOUNTER — Inpatient Hospital Stay (HOSPITAL_COMMUNITY): Payer: Medicaid Other | Admitting: Anesthesiology

## 2014-08-11 ENCOUNTER — Encounter (HOSPITAL_COMMUNITY): Payer: Self-pay | Admitting: Orthopaedic Surgery

## 2014-08-11 ENCOUNTER — Encounter (HOSPITAL_COMMUNITY)
Admission: EM | Disposition: A | Discharge status: Rehabilitation Facility | Payer: Self-pay | Source: Home / Self Care | Attending: Internal Medicine

## 2014-08-11 ENCOUNTER — Inpatient Hospital Stay (HOSPITAL_COMMUNITY): Payer: Medicaid Other

## 2014-08-11 DIAGNOSIS — M009 Pyogenic arthritis, unspecified: Secondary | ICD-10-CM | POA: Diagnosis present

## 2014-08-11 DIAGNOSIS — B9561 Methicillin susceptible Staphylococcus aureus infection as the cause of diseases classified elsewhere: Secondary | ICD-10-CM

## 2014-08-11 DIAGNOSIS — N183 Chronic kidney disease, stage 3 (moderate): Secondary | ICD-10-CM

## 2014-08-11 DIAGNOSIS — R509 Fever, unspecified: Secondary | ICD-10-CM

## 2014-08-11 DIAGNOSIS — M25512 Pain in left shoulder: Secondary | ICD-10-CM

## 2014-08-11 DIAGNOSIS — M7989 Other specified soft tissue disorders: Secondary | ICD-10-CM

## 2014-08-11 DIAGNOSIS — R7881 Bacteremia: Secondary | ICD-10-CM

## 2014-08-11 DIAGNOSIS — R000 Tachycardia, unspecified: Secondary | ICD-10-CM

## 2014-08-11 DIAGNOSIS — M79641 Pain in right hand: Secondary | ICD-10-CM

## 2014-08-11 HISTORY — PX: KNEE ARTHROSCOPY: SHX127

## 2014-08-11 HISTORY — PX: TENOSYNOVECTOMY: SHX6110

## 2014-08-11 LAB — BASIC METABOLIC PANEL
Anion gap: 8 (ref 5–15)
BUN: 27 mg/dL — ABNORMAL HIGH (ref 6–23)
CO2: 26 mmol/L (ref 19–32)
Calcium: 7.8 mg/dL — ABNORMAL LOW (ref 8.4–10.5)
Chloride: 99 mmol/L (ref 96–112)
Creatinine, Ser: 1.54 mg/dL — ABNORMAL HIGH (ref 0.50–1.10)
GFR calc Af Amer: 42 mL/min — ABNORMAL LOW (ref >90)
GFR calc non Af Amer: 36 mL/min — ABNORMAL LOW (ref >90)
Glucose, Bld: 158 mg/dL — ABNORMAL HIGH (ref 70–99)
Potassium: 3.4 mmol/L — ABNORMAL LOW (ref 3.5–5.1)
Sodium: 133 mmol/L — ABNORMAL LOW (ref 135–145)

## 2014-08-11 LAB — CBC
HCT: 29.7 % — ABNORMAL LOW (ref 36.0–46.0)
Hemoglobin: 9.8 g/dL — ABNORMAL LOW (ref 12.0–15.0)
MCH: 30.8 pg (ref 26.0–34.0)
MCHC: 33 g/dL (ref 30.0–36.0)
MCV: 93.4 fL (ref 78.0–100.0)
Platelets: 283 10*3/uL (ref 150–400)
RBC: 3.18 MIL/uL — ABNORMAL LOW (ref 3.87–5.11)
RDW: 13.3 % (ref 11.5–15.5)
WBC: 16.4 10*3/uL — ABNORMAL HIGH (ref 4.0–10.5)

## 2014-08-11 LAB — SURGICAL PCR SCREEN
MRSA, PCR: NEGATIVE
Staphylococcus aureus: POSITIVE — AB

## 2014-08-11 LAB — GLUCOSE, CAPILLARY
Glucose-Capillary: 105 mg/dL — ABNORMAL HIGH (ref 70–99)
Glucose-Capillary: 105 mg/dL — ABNORMAL HIGH (ref 70–99)
Glucose-Capillary: 110 mg/dL — ABNORMAL HIGH (ref 70–99)
Glucose-Capillary: 155 mg/dL — ABNORMAL HIGH (ref 70–99)
Glucose-Capillary: 69 mg/dL — ABNORMAL LOW (ref 70–99)
Glucose-Capillary: 80 mg/dL (ref 70–99)

## 2014-08-11 SURGERY — TENOSYNOVECTOMY
Anesthesia: General | Site: Wrist | Laterality: Right

## 2014-08-11 MED ORDER — FENTANYL CITRATE 0.05 MG/ML IJ SOLN
INTRAMUSCULAR | Status: AC
  Filled 2014-08-11: qty 2

## 2014-08-11 MED ORDER — CHLORHEXIDINE GLUCONATE 0.12 % MT SOLN
15.0000 mL | Freq: Two times a day (BID) | OROMUCOSAL | Status: DC
  Administered 2014-08-12 – 2014-09-13 (×51): 15 mL via OROMUCOSAL
  Filled 2014-08-11 (×70): qty 15

## 2014-08-11 MED ORDER — SODIUM CHLORIDE 0.9 % IR SOLN
Status: DC | PRN
  Administered 2014-08-11 (×3): 3000 mL

## 2014-08-11 MED ORDER — DEXTROSE-NACL 5-0.45 % IV SOLN
INTRAVENOUS | Status: DC
  Administered 2014-08-11: 1000 mL via INTRAVENOUS
  Administered 2014-08-12 – 2014-08-14 (×4): via INTRAVENOUS

## 2014-08-11 MED ORDER — HYDROMORPHONE HCL 1 MG/ML IJ SOLN
INTRAMUSCULAR | Status: AC
  Filled 2014-08-11: qty 1

## 2014-08-11 MED ORDER — NICOTINE 21 MG/24HR TD PT24
21.0000 mg | MEDICATED_PATCH | Freq: Every day | TRANSDERMAL | Status: DC
  Administered 2014-08-11 – 2014-09-14 (×34): 21 mg via TRANSDERMAL
  Filled 2014-08-11 (×35): qty 1

## 2014-08-11 MED ORDER — LIDOCAINE HCL (CARDIAC) 20 MG/ML IV SOLN
INTRAVENOUS | Status: DC | PRN
  Administered 2014-08-11: 70 mg via INTRAVENOUS

## 2014-08-11 MED ORDER — DEXTROSE 50 % IV SOLN
1.0000 | Freq: Once | INTRAVENOUS | Status: AC
  Administered 2014-08-11: 50 mL via INTRAVENOUS

## 2014-08-11 MED ORDER — HYDROMORPHONE HCL 1 MG/ML IJ SOLN
INTRAMUSCULAR | Status: DC | PRN
  Administered 2014-08-11: 1 mg via INTRAVENOUS

## 2014-08-11 MED ORDER — SUCCINYLCHOLINE CHLORIDE 20 MG/ML IJ SOLN
INTRAMUSCULAR | Status: AC
  Filled 2014-08-11: qty 1

## 2014-08-11 MED ORDER — HYDROMORPHONE HCL 1 MG/ML IJ SOLN
1.0000 mg | Freq: Once | INTRAMUSCULAR | Status: AC
  Administered 2014-08-11: 1 mg via INTRAVENOUS

## 2014-08-11 MED ORDER — DEXTROSE 50 % IV SOLN
INTRAVENOUS | Status: AC
  Filled 2014-08-11: qty 50

## 2014-08-11 MED ORDER — ONDANSETRON HCL 4 MG/2ML IJ SOLN
INTRAMUSCULAR | Status: AC
  Filled 2014-08-11: qty 2

## 2014-08-11 MED ORDER — FENTANYL CITRATE 0.05 MG/ML IJ SOLN
25.0000 ug | INTRAMUSCULAR | Status: DC | PRN
  Administered 2014-08-12 (×2): 50 ug via INTRAVENOUS
  Filled 2014-08-11 (×2): qty 2

## 2014-08-11 MED ORDER — MIDAZOLAM HCL 5 MG/5ML IJ SOLN
INTRAMUSCULAR | Status: DC | PRN
  Administered 2014-08-11: 2 mg via INTRAVENOUS

## 2014-08-11 MED ORDER — FENTANYL CITRATE 0.05 MG/ML IJ SOLN
INTRAMUSCULAR | Status: DC | PRN
  Administered 2014-08-11 (×3): 50 ug via INTRAVENOUS
  Administered 2014-08-11: 100 ug via INTRAVENOUS

## 2014-08-11 MED ORDER — LACTATED RINGERS IV SOLN
INTRAVENOUS | Status: DC | PRN
  Administered 2014-08-11 (×2): via INTRAVENOUS

## 2014-08-11 MED ORDER — ONDANSETRON HCL 4 MG/2ML IJ SOLN
INTRAMUSCULAR | Status: DC | PRN
  Administered 2014-08-11: 4 mg via INTRAVENOUS

## 2014-08-11 MED ORDER — PROPOFOL 10 MG/ML IV BOLUS
INTRAVENOUS | Status: DC | PRN
  Administered 2014-08-11: 100 mg via INTRAVENOUS

## 2014-08-11 MED ORDER — PROMETHAZINE HCL 25 MG/ML IJ SOLN: 6.2500 mg | INTRAMUSCULAR | Status: DC | PRN

## 2014-08-11 MED ORDER — GADOBENATE DIMEGLUMINE 529 MG/ML IV SOLN
18.0000 mL | Freq: Once | INTRAVENOUS | Status: AC | PRN
  Administered 2014-08-11: 19 mL via INTRAVENOUS

## 2014-08-11 MED ORDER — CETYLPYRIDINIUM CHLORIDE 0.05 % MT LIQD
7.0000 mL | Freq: Two times a day (BID) | OROMUCOSAL | Status: DC
  Administered 2014-08-11 – 2014-09-14 (×42): 7 mL via OROMUCOSAL

## 2014-08-11 MED ORDER — MIDAZOLAM HCL 2 MG/2ML IJ SOLN
INTRAMUSCULAR | Status: AC
  Filled 2014-08-11: qty 2

## 2014-08-11 MED ORDER — FENTANYL CITRATE 0.05 MG/ML IJ SOLN
INTRAMUSCULAR | Status: AC
  Filled 2014-08-11: qty 5

## 2014-08-11 MED ORDER — PROPOFOL 10 MG/ML IV BOLUS
INTRAVENOUS | Status: AC
  Filled 2014-08-11: qty 20

## 2014-08-11 SURGICAL SUPPLY — 81 items
BANDAGE ELASTIC 3 VELCRO ST LF (GAUZE/BANDAGES/DRESSINGS) ×3 IMPLANT
BANDAGE ELASTIC 4 VELCRO ST LF (GAUZE/BANDAGES/DRESSINGS) ×3 IMPLANT
BANDAGE ELASTIC 6 VELCRO ST LF (GAUZE/BANDAGES/DRESSINGS) ×3 IMPLANT
BANDAGE ESMARK 6X9 LF (GAUZE/BANDAGES/DRESSINGS) ×2 IMPLANT
BLADE CUDA 4.2 (BLADE) ×3 IMPLANT
BLADE CUDA 5.5 (BLADE) IMPLANT
BLADE CUTTER GATOR 3.5 (BLADE) IMPLANT
BLADE GREAT WHITE 4.2 (BLADE) IMPLANT
BLADE SURG 10 STRL SS (BLADE) IMPLANT
BLADE SURG 11 STRL SS (BLADE) IMPLANT
BLADE SURG ROTATE 9660 (MISCELLANEOUS) IMPLANT
BNDG COHESIVE 1X5 TAN STRL LF (GAUZE/BANDAGES/DRESSINGS) IMPLANT
BNDG COHESIVE 4X5 TAN STRL (GAUZE/BANDAGES/DRESSINGS) IMPLANT
BNDG COHESIVE 6X5 TAN STRL LF (GAUZE/BANDAGES/DRESSINGS) IMPLANT
BNDG CONFORM 3 STRL LF (GAUZE/BANDAGES/DRESSINGS) ×3 IMPLANT
BNDG ELASTIC 2 VLCR STRL LF (GAUZE/BANDAGES/DRESSINGS) ×3 IMPLANT
BNDG ESMARK 6X9 LF (GAUZE/BANDAGES/DRESSINGS) ×3
BNDG GAUZE STRTCH 6 (GAUZE/BANDAGES/DRESSINGS) IMPLANT
BUR OVAL 6.0 (BURR) IMPLANT
CORDS BIPOLAR (ELECTRODE) IMPLANT
COVER SURGICAL LIGHT HANDLE (MISCELLANEOUS) ×3 IMPLANT
CUFF TOURNIQUET SINGLE 24IN (TOURNIQUET CUFF) IMPLANT
CUFF TOURNIQUET SINGLE 34IN LL (TOURNIQUET CUFF) ×3 IMPLANT
CUFF TOURNIQUET SINGLE 44IN (TOURNIQUET CUFF) IMPLANT
DRAPE ARTHROSCOPY W/POUCH 114 (DRAPES) ×3 IMPLANT
DRAPE EXTREMITY BILATERAL (DRAPE) IMPLANT
DRAPE IMP U-DRAPE 54X76 (DRAPES) IMPLANT
DRAPE INCISE IOBAN 66X45 STRL (DRAPES) IMPLANT
DRAPE SURG 17X23 STRL (DRAPES) IMPLANT
DRAPE U-SHAPE 47X51 STRL (DRAPES) ×3 IMPLANT
DRSG PAD ABDOMINAL 8X10 ST (GAUZE/BANDAGES/DRESSINGS) ×9 IMPLANT
DURAPREP 26ML APPLICATOR (WOUND CARE) ×6 IMPLANT
ELECT CAUTERY BLADE 6.4 (BLADE) IMPLANT
ELECT REM PT RETURN 9FT ADLT (ELECTROSURGICAL)
ELECTRODE REM PT RTRN 9FT ADLT (ELECTROSURGICAL) IMPLANT
FACESHIELD WRAPAROUND (MASK) ×3 IMPLANT
GAUZE SPONGE 4X4 12PLY STRL (GAUZE/BANDAGES/DRESSINGS) ×6 IMPLANT
GAUZE XEROFORM 1X8 LF (GAUZE/BANDAGES/DRESSINGS) ×6 IMPLANT
GAUZE XEROFORM 5X9 LF (GAUZE/BANDAGES/DRESSINGS) IMPLANT
GLOVE ECLIPSE 6.5 STRL STRAW (GLOVE) ×6 IMPLANT
GLOVE NEODERM STRL 7.5 LF PF (GLOVE) ×4 IMPLANT
GLOVE ORTHO TXT STRL SZ7.5 (GLOVE) ×3 IMPLANT
GLOVE SURG NEODERM 7.5  LF PF (GLOVE) ×2
GOWN STRL REIN XL XLG (GOWN DISPOSABLE) ×6 IMPLANT
HANDPIECE INTERPULSE COAX TIP (DISPOSABLE)
KIT BASIN OR (CUSTOM PROCEDURE TRAY) ×3 IMPLANT
KIT ROOM TURNOVER OR (KITS) ×3 IMPLANT
MANIFOLD NEPTUNE II (INSTRUMENTS) ×3 IMPLANT
NS IRRIG 1000ML POUR BTL (IV SOLUTION) ×3 IMPLANT
PACK ARTHROSCOPY DSU (CUSTOM PROCEDURE TRAY) ×3 IMPLANT
PACK ORTHO EXTREMITY (CUSTOM PROCEDURE TRAY) ×3 IMPLANT
PAD ABD 8X10 STRL (GAUZE/BANDAGES/DRESSINGS) ×3 IMPLANT
PAD ARMBOARD 7.5X6 YLW CONV (MISCELLANEOUS) ×6 IMPLANT
PADDING CAST ABS 4INX4YD NS (CAST SUPPLIES) ×1
PADDING CAST ABS COTTON 4X4 ST (CAST SUPPLIES) ×2 IMPLANT
PADDING CAST COTTON 6X4 STRL (CAST SUPPLIES) IMPLANT
SET ARTHROSCOPY TUBING (MISCELLANEOUS) ×1
SET ARTHROSCOPY TUBING LN (MISCELLANEOUS) ×2 IMPLANT
SET HNDPC FAN SPRY TIP SCT (DISPOSABLE) IMPLANT
SPONGE GAUZE 4X4 12PLY STER LF (GAUZE/BANDAGES/DRESSINGS) ×6 IMPLANT
SPONGE LAP 18X18 X RAY DECT (DISPOSABLE) IMPLANT
SPONGE LAP 4X18 X RAY DECT (DISPOSABLE) ×3 IMPLANT
STOCKINETTE IMPERVIOUS 9X36 MD (GAUZE/BANDAGES/DRESSINGS) IMPLANT
SUT ETHILON 2 0 FS 18 (SUTURE) IMPLANT
SUT ETHILON 2 0 PSLX (SUTURE) IMPLANT
SUT ETHILON 3 0 PS 1 (SUTURE) ×9 IMPLANT
SUT VIC AB 2-0 CT1 36 (SUTURE) IMPLANT
SUT VIC AB 2-0 FS1 27 (SUTURE) IMPLANT
SYR CONTROL 10ML LL (SYRINGE) IMPLANT
SYRINGE 60CC LL (MISCELLANEOUS) ×3 IMPLANT
TOWEL OR 17X24 6PK STRL BLUE (TOWEL DISPOSABLE) ×3 IMPLANT
TOWEL OR 17X26 10 PK STRL BLUE (TOWEL DISPOSABLE) ×3 IMPLANT
TUBE ANAEROBIC SPECIMEN COL (MISCELLANEOUS) IMPLANT
TUBE CONNECTING 12X1/4 (SUCTIONS) ×3 IMPLANT
TUBE FEEDING 5FR 15 INCH (TUBING) ×3 IMPLANT
TUBE FEEDING 8FR 16IN STR KANG (MISCELLANEOUS) ×3 IMPLANT
TUBING CYSTO DISP (UROLOGICAL SUPPLIES) IMPLANT
UNDERPAD 30X30 INCONTINENT (UNDERPADS AND DIAPERS) ×3 IMPLANT
WAND HAND CNTRL MULTIVAC 90 (MISCELLANEOUS) IMPLANT
WATER STERILE IRR 1000ML POUR (IV SOLUTION) ×3 IMPLANT
YANKAUER SUCT BULB TIP NO VENT (SUCTIONS) ×3 IMPLANT

## 2014-08-11 NOTE — Progress Notes (Signed)
NURSING PROGRESS NOTE  Brandy Houston [id number] Transfer Data: 08/11/2014 4:31 PM Attending Provider: Caren Griffins, MD [id number], Mateo Flow, NP Code Status: full   Brandy Houston is a 59 y.o. female patient transferred from [facility]  -No acute distress noted.  -No complaints of shortness of breath.  -No complaints of chest pain.    Last Documented Vital Signs: Blood pressure 157/84, pulse 114, temperature 99.1 F (37.3 C), temperature source Oral, resp. rate 15, height 5' 7.5" (1.715 m), weight 90.22 kg (198 lb 14.4 oz), last menstrual period 03/16/2002, SpO2 96 %.  IV Fluids:  IV in place, occlusive dsg intact without redness, IV cath antecubital left, condition patent and no redness normal saline @ 50.   Allergies:  Compazine; Omnipaque; Shellfish-derived products; and Iodinated diagnostic agents  Past Medical History:   has a past medical history of Diabetes mellitus; Hypertension; and Peripheral neuropathy.  Past Surgical History:   has past surgical history that includes Cholecystectomy; Appendectomy; Small intestine surgery; Tubal ligation; Knee arthroscopy (Right, 08/10/2014); and Shoulder arthroscopy (Bilateral, 08/10/2014).  Social History:   reports that she has been smoking.  She has never used smokeless tobacco. She reports that she drinks alcohol. She reports that she does not use illicit drugs.  Skin: cellulitis to right hand  Patient/Family orientated to room. Information packet given to patient/family. Admission inpatient armband information verified with patient/family to include name and date of birth and placed on patient arm. Side rails up x 2, fall assessment and education completed with patient/family. Patient/family able to verbalize understanding of risk associated with falls and verbalized understanding to call for assistance before getting out of bed. Call light within reach. Patient/family able to voice and demonstrate understanding of unit orientation  instructions.    Will continue to evaluate and treat per MD orders.

## 2014-08-11 NOTE — Progress Notes (Signed)
Receive pt report from [place]. Waiting pt arrival to unit.

## 2014-08-11 NOTE — Progress Notes (Addendum)
ANTIBIOTIC CONSULT NOTE - Follow up  Pharmacy Consult for Vancomycin Indication: septic arthritis of AC joint  Allergies  Allergen Reactions  . Compazine [Prochlorperazine] Shortness Of Breath and Swelling    TONGUE SWELLS  . Omnipaque [Iohexol] Hives  . Shellfish-Derived Products Anaphylaxis  . Iodinated Diagnostic Agents Rash    Patient Measurements: Height: 5' 7.5" (171.5 cm) Weight: 198 lb 14.4 oz (90.22 kg) IBW/kg (Calculated) : 62.75 Adjusted Body Weight: 74kg  Vital Signs: Temp: 98.6 F (37 C) (02/26 1235) Temp Source: Oral (02/26 1235) BP: 152/77 mmHg (02/26 1235) Pulse Rate: 109 (02/26 1235) Intake/Output from previous day: 02/25 0701 - 02/26 0700 In: 2604.2 [P.O.:360; I.V.:1794.2; IV Piggyback:450] Out: 1101 [Urine:1100; Stool:1] Intake/Output from this shift: Total I/O In: 0  Out: 500 [Urine:500]  Labs:  Recent Labs  08/10/14 0450 08/11/14 0700  WBC 15.0* 16.4*  HGB 11.2* 9.8*  PLT 257 283  CREATININE 1.52* 1.54*   Estimated Creatinine Clearance: 45.8 mL/min (by C-G formula based on Cr of 1.54).  Medical History: Past Medical History  Diagnosis Date  . Diabetes mellitus   . Hypertension   . Peripheral neuropathy     Medications:  Scheduled:  . aspirin  81 mg Oral Daily  .  ceFAZolin (ANCEF) IV  2 g Intravenous 3 times per day  . cyclobenzaprine  5 mg Oral BID  . famotidine  20 mg Oral BID  . ferrous gluconate  324 mg Oral Q breakfast  . glipiZIDE  10 mg Oral BID AC  . hydrALAZINE  25 mg Oral TID  . insulin aspart  0-9 Units Subcutaneous TID WC  . nicotine  21 mg Transdermal Daily  . polyethylene glycol  17 g Oral BID  . sodium chloride  3 mL Intravenous Q12H  . vancomycin  750 mg Intravenous Q12H   Assessment: 78 yoF w Hx diabetes admitted 2/22 with intractable L shoulder pain. Pt injured her shoulder at work on 2/18. MRI shows septic arthritis of AC joint with associated myositis. ID is following. Pharmacy is consulted to dose  vancomycin, MD is dosing ceftriaxone. 2/25 underwent arthrotomy and debridement of L and R AC joints. Also R knee I&D.  2/24 >> ceftriaxone >> 2/25 2/24 >> Vancomycin >>  2/25 >> Ancef >>   Tmax: AF WBCs: elevated Renal: SCr improved a little (baseline unknown, recent levels 1.15-1.66 in Epic), CrCl 46CG  2/24 blood x 2: 2/2 Staph aureus 2/25 MRSA PCR: positive 2/25 R knee fluid: ngtd 2/25 L AC(acromioclavicular) joint fluid: GPC 2/25 L shoulder fluid: GPC  Goal of Therapy:  Vancomycin trough level 15-20 mcg/ml  Plan:  Cont Vancomycin 750mg  IV q12h. Measure Vanc trough at 1730. Follow up renal fxn, culture results, and clinical course.  Romeo Rabon, PharmD, pager [phone]. 08/11/2014,1:17 PM.

## 2014-08-11 NOTE — Op Note (Addendum)
Date of surgery: 08/11/2014  Preoperative diagnosis: Septic arthritis of left knee  Postoperative diagnosis: Same  Procedure:  1. Arthroscopic irrigation and debridement of septic arthritis of left knee 2. Extensive debridement in 3 compartments.  Surgeon: Eduard Roux, M.D.  Anesthesia: Gen.  Estimated blood loss: Minimal  Tourniquet time: Less than 1 hour  Findings: Gross purulence from left knee  Indication for for procedure: Mr. is a 59 year old female who has bacteremia C in multiple joints. She is status post I and D of multiple joints yesterday she returns today for for the above-mentioned procedure. She developed a painful knee effusion reminiscent to her contralateral knee and given her bacteremia, there was a high clinical suspicion of septic arthritis.  The patient elected to avoid aspiration since she was having her right upper extremity washed out already.  She was made aware of the risks, benefits, alternatives to the surgery.  Please see the separate dictation by my partner Dr. Rush Farmer who did perform the right hand I&D.  Description of procedure: The patient was identified in the preoperative holding area. She was marked by the surgeon and confirmed with the patient. She was brought back to the operating room. She was placed supine on the table. Nonsterile tourniquet was placed on the left upper thigh. Left lower extremity was prepped and draped in standard sterile fashion. Timeout was performed. Preoperative antibiotics were given.  The standard anteromedial anterolateral portals to the knee were established. We then found gross purulence from the knee. Was also a shaver I performed an extensive debridement and synovectomy of the knee in all 3 compartments. After thorough debridement and irrigation a medium Hemovac drain was placed intra-articularly under arthroscopic visualization. This drain was sewed in. The arthroscopic portal sites were closed with interrupted nylon  sutures. Sterile dressings were placed the patient tolerated the procedure well was extubated and transferred to the PACU in stable condition.  Postoperative plan. She will continue on IV antibiotics. We will remove the drain when it is appropriate. We will follow her for clinical improvement.  Azucena Cecil, MD [facility] [phone] 10:18 PM

## 2014-08-11 NOTE — Progress Notes (Signed)
[name], female   DOB: [birth year], 59 y.o.   MRN: [id number] I examined [name] right hand as well as reviewed her MRI.  She is extremely tender along her right middle finger on the flexor side with motion and palpation.  This extends from her palm to her middle finger proximal phalanx.  It is swollen as well.  The MRI is concerning for flexor and extensor tenosynovitis that is likely infectious.  I spoke with her and her son at the bedside about the need for further surgery to wash out the likely infection involving her right hand.  This will most likely be the flexor side of the right middle finger, but may also involve the extensor side.  I have also spoken to my partner Dr. [name] about this.  Surgery will be planned for later this evening.

## 2014-08-11 NOTE — Progress Notes (Signed)
Patient is complaining of worsening pain and swelling of the left knee.  Patient would like to avoid aspiration and just go ahead with arthroscopic washout for presumed septic arthritis.  We talked about the r/b/a and she wishes to proceed with surgery.  Consent signed.  Azucena Cecil, MD [facility] [phone] 5:03 PM

## 2014-08-11 NOTE — Transfer of Care (Signed)
Immediate Anesthesia Transfer of Care Note  Patient: Brandy Houston  Procedure(s) Performed: Procedure(s): RIGHT WRIST IRRIGATION AND DEBRIDEMENT, TENOSYNOVECTOMY (Right) ARTHROSCOPIC WASHOUT LEFT KNEE (Left)  Patient Location: PACU  Anesthesia Type:General  Level of Consciousness: responds to stimulation  Airway & Oxygen Therapy: Patient Spontanous Breathing and Patient connected to nasal cannula oxygen  Post-op Assessment: Report given to RN and Post -op Vital signs reviewed and stable  Post vital signs: Reviewed and stable  Last Vitals:  Filed Vitals:   08/11/14 1608  BP: 157/84  Pulse: 114  Temp: 37.3 C  Resp: 15    Complications: No apparent anesthesia complications

## 2014-08-11 NOTE — Progress Notes (Addendum)
[facility]    Date of Admission:  08/07/2014   Total days of antibiotics 3        Day 2 cefazolin        Day 3 vancomycin           ID: Brandy Houston is a 59 y.o. female who initially presented with left shoulder pain, now found to have staph aureus bacteremia, polyarticular septic arthritis to left shoulder, right shoulder and right knee. POD#1  1. Arthroscopic irrigation and debridement of right knee with extensive synovectomy in 3 compartments. 2. Arthrotomy of right acromioclavicular joint and  Debridement of bone, subcutaneous tissue, muscle of right shoulder region 8 cm x 5 cm. 3. Arthrotomy of left acromioclavicular joint plus  irrigation and debridement of subcutaneous tissue, muscle of left shoulder abscess 15 cm x 10 cm  Principal Problem:   Acute pain of left shoulder Active Problems:   Tachycardia   Sinus tachycardia   Gram-positive bacteremia   Septic joint   CKD (chronic kidney disease) stage 3, GFR 30-59 ml/min    Subjective: Fever curve trending down. Afebrile in the last 24hr. Went to OR last night for multiple joint I xD, more specimens sent for culture. Reported to primary team that she had worsening right hand pain and swelling  Medications:  . aspirin  81 mg Oral Daily  .  ceFAZolin (ANCEF) IV  2 g Intravenous 3 times per day  . cyclobenzaprine  5 mg Oral BID  . famotidine  20 mg Oral BID  . ferrous gluconate  324 mg Oral Q breakfast  . glipiZIDE  10 mg Oral BID AC  . hydrALAZINE  25 mg Oral TID  . insulin aspart  0-9 Units Subcutaneous TID WC  . nicotine  21 mg Transdermal Daily  . polyethylene glycol  17 g Oral BID  . sodium chloride  3 mL Intravenous Q12H  . vancomycin  750 mg Intravenous Q12H    Objective: Vital signs in last 24 hours: Temp:  [98 F (36.7 C)-99.2 F (37.3 C)] 98 F (36.7 C) (02/26 0524) Pulse Rate:  [103-117] 110 (02/26 0910) Resp:  [10-18] 14 (02/26 0524) BP: (127-165)/(55-118) 139/90 mmHg (02/26  0524) SpO2:  [94 %-100 %] 98 % (02/26 0910) Patient away for mri  Lab Results  Recent Labs  08/10/14 0450 08/11/14 0700  WBC 15.0* 16.4*  HGB 11.2* 9.8*  HCT 33.6* 29.7*  NA 128* 133*  K 4.0 3.4*  CL 100 99  CO2 23 26  BUN 30* 27*  CREATININE 1.52* 1.54*   Liver Panel No results for input(s): PROT, ALBUMIN, AST, ALT, ALKPHOS, BILITOT, BILIDIR, IBILI in the last 72 hours. Sedimentation Rate  Recent Labs  08/09/14 1520  ESRSEDRATE 120*   C-Reactive Protein  Recent Labs  08/09/14 1520  CRP 18.3*    Microbiology: 2/24 blood cx 2/2 staph aureus (sensi pending) 2/26 blood cx pending 2/25 left AC fluid + GPC on gram stain 2/25 right knee aspirate negative gram stain 2/25 left shoulder tissue GPC on gram stain  Studies/Results: Ir Fluoro Guide Ndl Plmt / Bx  08/10/2014   CLINICAL DATA:  59 year old female with left shoulder pain and evidence on recent MRI of septic arthritis of acromioclavicular joint. She has been referred for evaluation of aspiration.  EXAM: ULTRASOUND AND FLUOROSCOPIC GUIDED LEFT ACROMIOCLAVICULAR JOINT ASPIRATION.  TECHNIQUE: The procedure, risks, benefits, and alternatives were explained to the patient. Questions regarding the procedure were encouraged and answered.  The patient understands and consents to the procedure.  Fluoroscopic image of the left shoulder performed. Ultrasound survey of the shoulder performed. Image was stored and sent to PACs with both modality.  Using fluoroscopy, the skin and subcutaneous tissues overlying the left acromioclavicular joint were generously infiltrated 1% lidocaine for local anesthesia.  We then used ultrasound guidance to advance an 18 gauge needle attached to a syringe into the left acromioclavicular joint fluid. Two separate aspiration were performed. Approximately 1-2 cc of fluid were aspirated.  Samples were sent to the lab for analysis.  Patient tolerated the procedure well and remained hemodynamically stable  throughout.  No complications were encountered and no significant blood loss was encountered.  COMPLICATIONS: None  IMPRESSION: Status post fluoroscopic and ultrasound-guided left AC joint aspiration with sample sent to the lab for analysis.  Signed,  Dulcy Fanny. Earleen Newport, DO  [facility]  [facility]   Electronically Signed   By: Corrie Mckusick D.O.   On: 08/10/2014 12:07     Assessment/Plan:58yo F presented with left shoulder pain now found to have disseminated staph aureus infection with bacteremia plus polyarticular septic arthritis (bilateral shoulders with associated myositis, deep tissue abscess, and right knee septic arthritis)   Staph aureus bacteremia = spoke with micro lab that confirmed staph aureus, sensitivities are pending.  - Recommend to continue on vancomycin and cefazolin. Once sensitivities return, can narrow antibiotics.  - She had TTE that did not show vegetation.  - Since she has multiple joint involvements causing septic arthritis, she will need longer course of therapy of 6 wk.  - TEE would not change abtx length of treatment at this point, but maybe of use if she has persistent bacteremia - Repeat cx done today on 2/26. Will need to wait 48hr to see if any further bacteremia. Can consider picc line on 2/29 if cx from 2/26 remain negative.  Polyarticular septic arthritis = culture sent from left shoulder, and right knee. No cx sent from right shoulder, however, OR report suggests purulence near joint as well as abscess that was I x D. Will recommend 6 wks of Iv therapy  Leukocytosis and fever = from infection, likely to improved since having I x D plus antibiotics  Right hand swelling/pain = has had sx x 2 days. 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  Will check genotype and viral load. Can pursue treatment if she has viremia as an outpatient once she finishes treatment for septic arthritis  Dr. Megan Salon to provide recs over the weekend.  Baxter Flattery [facility] Cell: [phone] Pager: [phone]  08/11/2014, 10:09 AM

## 2014-08-11 NOTE — Progress Notes (Signed)
Pt arrived on unit. Alert and oriented. Pain controlled per pt. VSS.

## 2014-08-11 NOTE — Progress Notes (Addendum)
PROGRESS NOTE  Brandy Houston [id number] DOB: 1955/10/05 DOA: 08/07/2014 PCP: Chari Manning, NP  HPI: Brandy Houston is a 59 yo African American female who has a history of Diabetes, hypertension, and peripheral neuropathy. She presented to [facility] ED on 2/22 with intractable left shoulder pain. 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 On Friday she could not longer lift her arm due to her pain level. She used Aleve, Bengay, and her daughter's muscle relaxers with minimal relief. On 2/22 pain was localized to left shoulder with significantly reduced ROM. 2/24 pain moved to right shoulder and right finger, Ortho was consulted. On 2/25, pain and edema was located in right knee as well. Received IR left AC aspiration and then went down to OR to have three joints irrigated and debrided on 2/25.   Subjective: - Patient is in extreme pain with a pain of 9-10. States that after IV dilaudid she has better ROM of joints, but pain is too severe to move later. Right hand is still rather edematous and painful, cannot make a fist.  - Denies chest pain, shortness of breath, nausea, vomiting, diarrhea/constipation. - unable to ambulate due to pain - reports a BM last night  Assessment/Plan:  Polyarticular septic arthritis (right and left AC joints, right knee, right hand, left knee) - Progressively worsened septic arthritis; fever, tachycardia, and hypertension are mildly improved - Ortho: I&D to right and left and right shoulders and right knee last night. POD #1 - Continue Vancomycin 750mg  IV Q 12 hours & Ancef 2g IV Q8 hours - Pain managed by Dilaudid 0.5mg  IV Q4 PRN and Hydrocodone 1-2 tablets Q4 hours PRN Flexeril 5mg  BID   Bacteremia  - GPC from blood culture on 2/24. Speciation still pending - Continue antibiotics as stated above  - ID is following  Diabetes mellitus type 2, uncontrolled - hypoglycemic due to NPO/infection - Continue  Glipizide and SSI   Sinus Tachycardia - Due to #1  - No arrhythmias, d/c tele  Chronic renal failure, CKD II-III - Stable since admission  Anemia - Hb 9.8; decrease from yesterday, but could be attributed to surgery on 2/25 - Continue iron supplements - Daily CBC  Hypertension - Continue at home medications  Smoking Cessation  - Continue Nicoderm patch  DVT Prophylaxis:  SCD   Code Status: Full  Family Communication: Discussed with patient, family not at bedside Disposition Plan: Inpatient  Consultants:  Ortho - Dr. [name]  ID - Dr. Baxter Flattery  Procedures:  2/25: IR left AC aspiration of joint fluid with 1-2cc fluid aspirated  2/25: Right knee aspirated with 100 cc of turbid fluid   2/25: Arthroscopic I&D of R knee with synovectomy  2/25: Arthrotomy of right [facility] joint with debridement of bone, SQ tissue, and muscle  2/25: Arthrotomy of left AC joint with I&D of SQ tissue and muscle   Antibiotics:  Ancef 2g IV three times daily  Vancomycin 750mg  IV Q12 hours  Objective: Filed Vitals:   08/11/14 0010 08/11/14 0040 08/11/14 0524 08/11/14 0910  BP: 139/88 127/93 139/90   Pulse: 103 107 103 110  Temp: 98.2 F (36.8 C) 98 F (36.7 C) 98 F (36.7 C)   TempSrc: Oral Oral Oral   Resp: 14 13 14    Height:      Weight:      SpO2: 96% 96% 97% 98%    Intake/Output Summary (Last 24 hours) at 08/11/14 1039 Last data filed  at 08/11/14 0900  Gross per 24 hour  Intake 2604.17 ml  Output    501 ml  Net 2103.17 ml   Filed Weights   08/07/14 2009  Weight: 90.22 kg (198 lb 14.4 oz)    Exam: General: Well developed, well nourished, appears to be in extreme pain, appears stated age  30:  Anicteic Sclera, MMM.   Neck: Supple, no JVD, no masses  Cardiovascular: RRR, S1 S2 auscultated, no rubs, murmurs or gallops.   Respiratory: Clear to auscultation bilaterally with equal chest rise  Abdomen: Soft, nontender, nondistended, + bowel sounds  Extremities: warm dry  without cyanosis clubbing. Extreme tenderness and warmth of right and left shoulder, right hand and right knee. Decreased ROM in stated joints.  Neuro: AAOx3, cranial nerves grossly intact. Decreased strength due to pain level Skin: Without rashes exudates or nodules.   Psych: Normal affect and demeanor with intact judgement and insight  Data Reviewed: Basic Metabolic Panel:  Recent Labs Lab 08/07/14 1311 08/08/14 0520 08/10/14 0450 08/11/14 0700  NA 131* 134* 128* 133*  K 3.7 4.3 4.0 3.4*  CL 96 103 100 99  CO2 27 23 23 26   GLUCOSE 186* 281* 159* 158*  BUN 24* 29* 30* 27*  CREATININE 1.66* 1.64* 1.52* 1.54*  CALCIUM 8.4 8.5 7.9* 7.8*   CBC:  Recent Labs Lab 08/07/14 1311 08/08/14 0520 08/10/14 0450 08/11/14 0700  WBC 11.5* 12.5* 15.0* 16.4*  NEUTROABS 10.6*  --   --   --   HGB 11.6* 11.4* 11.2* 9.8*  HCT 34.7* 35.3* 33.6* 29.7*  MCV 94.0 94.9 93.6 93.4  PLT 201 223 257 283   Cardiac Enzymes:  Recent Labs Lab 08/08/14 0020 08/08/14 0520  TROPONINI 0.09* 0.08*   CBG:  Recent Labs Lab 08/10/14 0734 08/10/14 1319 08/10/14 1721 08/10/14 2200 08/11/14 0728  GLUCAP 174* 167* 137* 105* 155*    Recent Results (from the past 240 hour(s))  Culture, blood (routine x 2)     Status: None (Preliminary result)   Collection Time: 08/09/14 12:15 PM  Result Value Ref Range Status   Specimen Description BLOOD RIGHT HAND  Final   Special Requests   Final    BOTTLES DRAWN AEROBIC AND ANAEROBIC 7CCS BOTH BOTTLES   Culture   Final    GRAM POSITIVE COCCI IN CLUSTERS Note: Gram Stain Report Called to,Read Back By and Verified With: Shirlean Kelly 700AM 08/10/14 [place] Performed at [facility]    Report Status PENDING  Incomplete  Culture, blood (routine x 2)     Status: None (Preliminary result)   Collection Time: 08/09/14 12:25 PM  Result Value Ref Range Status   Specimen Description BLOOD RIGHT ARM  Final   Special Requests   Final    BOTTLES DRAWN  AEROBIC AND ANAEROBIC 10CC BOTH BOTTLES   Culture   Final    GRAM POSITIVE COCCI IN CLUSTERS Note: Gram Stain Report Called to,Read Back By and Verified With: Shirlean Kelly 700AM 08/10/14 [place] Performed at [facility]    Report Status PENDING  Incomplete  Culture, routine-abscess     Status: None (Preliminary result)   Collection Time: 08/10/14 11:54 AM  Result Value Ref Range Status   Specimen Description SHOULDER LEFT  Final   Special Requests NONE  Final   Gram Stain   Final    ABUNDANT WBC PRESENT, PREDOMINANTLY PMN NO SQUAMOUS EPITHELIAL CELLS SEEN MODERATE GRAM POSITIVE COCCI IN PAIRS IN CLUSTERS Performed at [facility]    Culture PENDING  Incomplete   Report Status PENDING  Incomplete  Anaerobic culture     Status: None (Preliminary result)   Collection Time: 08/10/14 11:54 AM  Result Value Ref Range Status   Specimen Description SHOULDER LEFT  Final   Special Requests NONE  Final   Gram Stain   Final    MODERATE WBC PRESENT, PREDOMINANTLY PMN NO SQUAMOUS EPITHELIAL CELLS SEEN MODERATE GRAM POSITIVE COCCI IN PAIRS IN CLUSTERS Performed at [facility]    Culture PENDING  Incomplete   Report Status PENDING  Incomplete  Body fluid culture     Status: None (Preliminary result)   Collection Time: 08/10/14  5:22 PM  Result Value Ref Range Status   Specimen Description SYNOVIAL JOINT RIGHT KNEE  Final   Special Requests Normal  Final   Gram Stain   Final    ABUNDANT WBC PRESENT,BOTH PMN AND MONONUCLEAR NO ORGANISMS SEEN Performed at [facility]    Culture PENDING  Incomplete   Report Status PENDING  Incomplete  Surgical pcr screen     Status: Abnormal   Collection Time: 08/10/14  6:42 PM  Result Value Ref Range Status   MRSA, PCR NEGATIVE NEGATIVE Final   Staphylococcus aureus POSITIVE (A) NEGATIVE Final    Comment:        The Xpert SA Assay (FDA approved for NASAL specimens in patients over 59 years of age), is one  component of a comprehensive surveillance program.  Test performance has been validated by [facility] for patients greater than or equal to 72 year old. It is not intended to diagnose infection nor to guide or monitor treatment.   Body fluid culture     Status: None (Preliminary result)   Collection Time: 08/10/14  9:14 PM  Result Value Ref Range Status   Specimen Description FLUID LEFT AC JOINT  Final   Special Requests VANCO, ROCEPHIN  Final   Gram Stain   Final    FEW WBC PRESENT, PREDOMINANTLY PMN MODERATE GRAM POSITIVE COCCI IN PAIRS IN CLUSTERS Gram Stain Report Called to,Read Back By and Verified With: Gram Stain Report Called to,Read Back By and Verified With: Laurance Flatten RN 08/11/13 9:10AM [place] Performed at [facility]    Culture NO GROWTH Performed at [facility]   Final   Report Status PENDING  Incomplete  Anaerobic culture     Status: None (Preliminary result)   Collection Time: 08/10/14  9:14 PM  Result Value Ref Range Status   Specimen Description FLUID LEFT [facility] JOINT  Final   Special Requests VANCO, ROCEPHIN  Final   Gram Stain   Final    FEW WBC PRESENT, PREDOMINANTLY PMN MODERATE GRAM POSITIVE COCCI IN PAIRS IN CLUSTERS Performed at [facility]    Culture PENDING  Incomplete   Report Status PENDING  Incomplete     Studies: Ir Fluoro Guide Ndl Plmt / Bx  08/10/2014   CLINICAL DATA:  59 year old female with left shoulder pain and evidence on recent MRI of septic arthritis of acromioclavicular joint. She has been referred for evaluation of aspiration.  EXAM: ULTRASOUND AND FLUOROSCOPIC GUIDED LEFT ACROMIOCLAVICULAR JOINT ASPIRATION.  TECHNIQUE: The procedure, risks, benefits, and alternatives were explained to the patient. Questions regarding the procedure were encouraged and answered. The patient understands and consents to the procedure.  Fluoroscopic image of the left shoulder performed. Ultrasound survey of the shoulder  performed. Image was stored and sent to PACs  with both modality.  Using fluoroscopy, the skin and subcutaneous tissues overlying the left acromioclavicular joint were generously infiltrated 1% lidocaine for local anesthesia.  We then used ultrasound guidance to advance an 18 gauge needle attached to a syringe into the left acromioclavicular joint fluid. Two separate aspiration were performed. Approximately 1-2 cc of fluid were aspirated.  Samples were sent to the lab for analysis.  Patient tolerated the procedure well and remained hemodynamically stable throughout.  No complications were encountered and no significant blood loss was encountered.  COMPLICATIONS: None  IMPRESSION: Status post fluoroscopic and ultrasound-guided left AC joint aspiration with sample sent to the lab for analysis.  Signed,  Dulcy Fanny. Earleen Newport, DO  [facility]  [facility]   Electronically Signed   By: Corrie Mckusick D.O.   On: 08/10/2014 12:07    Scheduled Meds: . aspirin  81 mg Oral Daily  .  ceFAZolin (ANCEF) IV  2 g Intravenous 3 times per day  . cyclobenzaprine  5 mg Oral BID  . famotidine  20 mg Oral BID  . ferrous gluconate  324 mg Oral Q breakfast  . glipiZIDE  10 mg Oral BID AC  . hydrALAZINE  25 mg Oral TID  . insulin aspart  0-9 Units Subcutaneous TID WC  . nicotine  21 mg Transdermal Daily  . polyethylene glycol  17 g Oral BID  . sodium chloride  3 mL Intravenous Q12H  . vancomycin  750 mg Intravenous Q12H   Continuous Infusions: . sodium chloride 50 mL/hr (08/10/14 1207)    Principal Problem:   Acute pain of left shoulder Active Problems:   Tachycardia   Sinus tachycardia   Gram-positive bacteremia   Septic joint   CKD (chronic kidney disease) stage 3, GFR 30-59 ml/min    Foye Spurling, PA-S [facility] 08/11/2014, 10:39 AM    Costin M. Cruzita Lederer, MD [facility] [phone]

## 2014-08-11 NOTE — Anesthesia Postprocedure Evaluation (Signed)
  Anesthesia Post-op Note  Patient: Brandy Houston  Procedure(s) Performed: Procedure(s) (LRB): RIGHT WRIST IRRIGATION AND DEBRIDEMENT, TENOSYNOVECTOMY (Right) ARTHROSCOPIC WASHOUT LEFT KNEE (Left)  Patient Location: PACU  Anesthesia Type: General  Level of Consciousness: awake and alert   Airway and Oxygen Therapy: Patient Spontanous Breathing  Post-op Pain: mild  Post-op Assessment: Post-op Vital signs reviewed, Patient's Cardiovascular Status Stable, Respiratory Function Stable, Patent Airway and No signs of Nausea or vomiting  Last Vitals:  Filed Vitals:   08/11/14 1608  BP: 157/84  Pulse: 114  Temp: 37.3 C  Resp: 15    Post-op Vital Signs: stable   Complications: No apparent anesthesia complications

## 2014-08-11 NOTE — Progress Notes (Signed)
Pt CBG prior to surgery 69. Callhan,PA made aware and ordered to give Dextrose 50% solution, order carried out. Called OR and made aware.

## 2014-08-11 NOTE — Progress Notes (Signed)
Patient down in MRI currently.  Will return later to examine patient.  Azucena Cecil, MD [place] 12:17 PM

## 2014-08-11 NOTE — Anesthesia Preprocedure Evaluation (Signed)
Anesthesia Evaluation  Patient identified by MRN, date of birth, ID band Patient awake    Reviewed: Allergy & Precautions, NPO status , Patient's Chart, lab work & pertinent test results  Airway Mallampati: II  TM Distance: >3 FB Neck ROM: Full    Dental  (+) Missing, Poor Dentition, Loose, Dental Advisory Given   Pulmonary Current Smoker,  breath sounds clear to auscultation  Pulmonary exam normal       Cardiovascular hypertension, Rhythm:Regular Rate:Normal     Neuro/Psych negative neurological ROS  negative psych ROS   GI/Hepatic negative GI ROS, Neg liver ROS,   Endo/Other  negative endocrine ROSdiabetes  Renal/GU Renal InsufficiencyRenal disease  negative genitourinary   Musculoskeletal negative musculoskeletal ROS (+)   Abdominal   Peds negative pediatric ROS (+)  Hematology  (+) anemia ,   Anesthesia Other Findings   Reproductive/Obstetrics negative OB ROS                             Anesthesia Physical Anesthesia Plan  ASA: III and emergent  Anesthesia Plan: General   Post-op Pain Management:    Induction: Intravenous  Airway Management Planned: Oral ETT and LMA  Additional Equipment:   Intra-op Plan:   Post-operative Plan: Extubation in OR  Informed Consent: I have reviewed the patients History and Physical, chart, labs and discussed the procedure including the risks, benefits and alternatives for the proposed anesthesia with the patient or authorized representative who has indicated his/her understanding and acceptance.   Dental advisory given  Plan Discussed with: CRNA and Surgeon  Anesthesia Plan Comments:         Anesthesia Quick Evaluation

## 2014-08-12 DIAGNOSIS — R6510 Systemic inflammatory response syndrome (SIRS) of non-infectious origin without acute organ dysfunction: Secondary | ICD-10-CM | POA: Insufficient documentation

## 2014-08-12 DIAGNOSIS — A419 Sepsis, unspecified organism: Secondary | ICD-10-CM

## 2014-08-12 DIAGNOSIS — I10 Essential (primary) hypertension: Secondary | ICD-10-CM

## 2014-08-12 DIAGNOSIS — M00861 Arthritis due to other bacteria, right knee: Secondary | ICD-10-CM

## 2014-08-12 DIAGNOSIS — M00841 Arthritis due to other bacteria, right hand: Secondary | ICD-10-CM

## 2014-08-12 LAB — COMPREHENSIVE METABOLIC PANEL
ALT: 16 U/L (ref 0–35)
AST: 27 U/L (ref 0–37)
Albumin: 1.1 g/dL — ABNORMAL LOW (ref 3.5–5.2)
Alkaline Phosphatase: 97 U/L (ref 39–117)
Anion gap: 10 (ref 5–15)
BUN: 21 mg/dL (ref 6–23)
CO2: 26 mmol/L (ref 19–32)
Calcium: 8 mg/dL — ABNORMAL LOW (ref 8.4–10.5)
Chloride: 100 mmol/L (ref 96–112)
Creatinine, Ser: 1.42 mg/dL — ABNORMAL HIGH (ref 0.50–1.10)
GFR calc Af Amer: 46 mL/min — ABNORMAL LOW (ref >90)
GFR calc non Af Amer: 40 mL/min — ABNORMAL LOW (ref >90)
Glucose, Bld: 112 mg/dL — ABNORMAL HIGH (ref 70–99)
Potassium: 3.6 mmol/L (ref 3.5–5.1)
Sodium: 136 mmol/L (ref 135–145)
Total Bilirubin: 0.4 mg/dL (ref 0.3–1.2)
Total Protein: 5.7 g/dL — ABNORMAL LOW (ref 6.0–8.3)

## 2014-08-12 LAB — URINALYSIS, ROUTINE W REFLEX MICROSCOPIC
Bilirubin Urine: NEGATIVE
Glucose, UA: 100 mg/dL — AB
Ketones, ur: NEGATIVE mg/dL
Nitrite: NEGATIVE
Protein, ur: >300 mg/dL — AB
Specific Gravity, Urine: 1.02 (ref 1.005–1.030)
Urobilinogen, UA: 1 mg/dL (ref 0.0–1.0)
pH: 5.5 (ref 5.0–8.0)

## 2014-08-12 LAB — GLUCOSE, CAPILLARY
Glucose-Capillary: 105 mg/dL — ABNORMAL HIGH (ref 70–99)
Glucose-Capillary: 139 mg/dL — ABNORMAL HIGH (ref 70–99)
Glucose-Capillary: 107 mg/dL — ABNORMAL HIGH (ref 70–99)
Glucose-Capillary: 171 mg/dL — ABNORMAL HIGH (ref 70–99)
Glucose-Capillary: 184 mg/dL — ABNORMAL HIGH (ref 70–99)

## 2014-08-12 LAB — CULTURE, BLOOD (ROUTINE X 2)

## 2014-08-12 LAB — CBC
HCT: 29.1 % — ABNORMAL LOW (ref 36.0–46.0)
Hemoglobin: 9.6 g/dL — ABNORMAL LOW (ref 12.0–15.0)
MCH: 30.5 pg (ref 26.0–34.0)
MCHC: 33 g/dL (ref 30.0–36.0)
MCV: 92.4 fL (ref 78.0–100.0)
Platelets: 285 10*3/uL (ref 150–400)
RBC: 3.15 MIL/uL — ABNORMAL LOW (ref 3.87–5.11)
RDW: 13.6 % (ref 11.5–15.5)
WBC: 18.2 10*3/uL — ABNORMAL HIGH (ref 4.0–10.5)

## 2014-08-12 LAB — URINE MICROSCOPIC-ADD ON

## 2014-08-12 LAB — VANCOMYCIN, TROUGH: Vancomycin Tr: 23.6 ug/mL — ABNORMAL HIGH (ref 10.0–20.0)

## 2014-08-12 MED ORDER — HEPARIN SODIUM (PORCINE) 5000 UNIT/ML IJ SOLN
5000.0000 [IU] | Freq: Three times a day (TID) | INTRAMUSCULAR | Status: DC
  Administered 2014-08-12 – 2014-08-18 (×17): 5000 [IU] via SUBCUTANEOUS
  Filled 2014-08-12 (×21): qty 1

## 2014-08-12 MED ORDER — VANCOMYCIN HCL IN DEXTROSE 750-5 MG/150ML-% IV SOLN
750.0000 mg | Freq: Two times a day (BID) | INTRAVENOUS | Status: DC
  Filled 2014-08-12: qty 150

## 2014-08-12 MED ORDER — VANCOMYCIN HCL 500 MG IV SOLR: 500.0000 mg | Freq: Two times a day (BID) | INTRAVENOUS | Status: DC

## 2014-08-12 MED ORDER — WHITE PETROLATUM GEL
Status: AC
  Administered 2014-08-12: 0.2
  Filled 2014-08-12: qty 1

## 2014-08-12 NOTE — Progress Notes (Signed)
   Subjective:  Patient reports pain as greatly improved  Objective:   VITALS:   Filed Vitals:   08/11/14 2258 08/11/14 2300 08/11/14 2342 08/12/14 0546  BP:  154/73 166/89 159/77  Pulse: 109 107 109 112  Temp: 98.8 F (37.1 C)  99.2 F (37.3 C) 99.4 F (37.4 C)  TempSrc:   Oral Oral  Resp: 15 12 20 19   Height:      Weight:      SpO2: 98% 97% 100% 95%    Bilateral shoulder and right knee clinically improved.   Left knee with HVAC drain RUE exam improved.  No signs of worsening   Lab Results  Component Value Date   WBC 18.2* 08/12/2014   HGB 9.6* 08/12/2014   HCT 29.1* 08/12/2014   MCV 92.4 08/12/2014   PLT 285 08/12/2014     Assessment/Plan:  1 Day Post-Op   - patient is clinically improving - continue IV abx - continue HVAC drain - patient may eat  Marianna Payment 08/12/2014, 10:05 AM [phone]

## 2014-08-12 NOTE — Progress Notes (Signed)
PATIENT DETAILS Name: Brandy Houston Age: 59 y.o. Sex: female Date of Birth: [birth year] Admit Date: 08/07/2014 Admitting Physician Eber Washer, MD [id number], Mateo Flow, NP  Subjective: Pain at multiple Id sites.  Assessment/Plan: Principal Problem:   MSSA bacteremia with Polyarticular septic arthrits:Initally admitted with acute left shoulder pain, further work up with a MRI of left shoulder demonstrated a septic AC joint.Subsequent blood cultures positive for MSSA. ID and Orthopedics were consulted. Blood cultures on 2/24 was also positive for MSSA. Patient meanwhile started having pain/swelling of her B/L knee joints, Right should joint  as well. Further workup demonstarted septic arthritis of these joints.Patient subsequently underwent irrigation and debridement of Right knee, left knee, Right AC & left AC joints, and Irrigation and debridement of right hand flexor tendon sheath. ID directina ABx-now just on IV Ancef. Will consult cards in am for TEE on Monday.PICC line will be placed prior to discharge.   Active Problems:   SIR's:secondary to above. Better with IVF and IV Abx. Follow cultures.    Anemia:secondary to acute illness/CKD. Follow CBC periodically    DM-2:CBG's controlled with SSI. Stop Glipizide and resume on discharge. Follow    [id number] control with Hydralazine. Follow and titrate medications accordingly    Acute on CKD Stage 3: likely ARF-from pre-renal azotemia. Creatinine better, follow    Tobacco Abuse:transdermal nicotine, counseled  Disposition: Remain inpatient  Antibiotics:  See below   Anti-infectives    Start     Dose/Rate Route Frequency Ordered Stop   08/12/14 1800  vancomycin (VANCOCIN) 500 mg in sodium chloride 0.9 % 100 mL IVPB  Status:  Discontinued     500 mg 100 mL/hr over 60 Minutes Intravenous Every 12 hours 08/12/14 0705 08/12/14 0721   08/12/14 1800  vancomycin (VANCOCIN) IVPB 750 mg/150 ml premix  Status:  Discontinued      750 mg 150 mL/hr over 60 Minutes Intravenous Every 12 hours 08/12/14 0721 08/12/14 1315   08/10/14 2138  vancomycin (VANCOCIN) powder  Status:  Discontinued       As needed 08/10/14 2138 08/10/14 2153   08/10/14 1600  cefTRIAXone (ROCEPHIN) 2 g in dextrose 5 % 50 mL IVPB - Premix  Status:  Discontinued     2 g 100 mL/hr over 30 Minutes Intravenous Every 24 hours 08/09/14 1555 08/10/14 1432   08/10/14 1600  ceFAZolin (ANCEF) IVPB 2 g/50 mL premix     2 g 100 mL/hr over 30 Minutes Intravenous 3 times per day 08/10/14 1432     08/10/14 0600  vancomycin (VANCOCIN) IVPB 750 mg/150 ml premix  Status:  Discontinued     750 mg 150 mL/hr over 60 Minutes Intravenous Every 12 hours 08/09/14 2018 08/12/14 0705   08/09/14 1800  vancomycin (VANCOCIN) 2,000 mg in sodium chloride 0.9 % 500 mL IVPB     2,000 mg 250 mL/hr over 120 Minutes Intravenous  Once 08/09/14 1616 08/09/14 1929   08/09/14 1630  cefTRIAXone (ROCEPHIN) 2 g in dextrose 5 % 50 mL IVPB - Premix     2 g 100 mL/hr over 30 Minutes Intravenous  Once 08/09/14 1553 08/09/14 1653      DVT Prophylaxis: Prophylactic Heparin   Code Status: Full code   Family Communication None at bedside  Procedures:  2/25: IR left AC aspiration of joint fluid with 1-2cc fluid aspirated  2/25: Right knee aspirated with 100 cc of turbid fluid   2/25: Arthroscopic I&D of R knee with  synovectomy  2/25: Arthrotomy of right AC joint with debridement of bone, SQ tissue, and muscle  2/25: Arthrotomy of left AC joint with I&D of SQ tissue and muscle  2/26: Irrigation and debridement of right hand flexor tendon sheath from areas of the palm over the A1 pulley and the proximal middle phalanx of the middle finger.  2/26:I and D of superficial deep tissues, right volar forearm.  2/26Arthroscopic irrigation and debridement of septic arthritis of left knee  CONSULTS:  ID and orthopedic surgery  Time spent 40 minutes-which includes 50% of the time  with face-to-face with patient/ family and coordinating care related to the above assessment and plan.  MEDICATIONS: Scheduled Meds: . antiseptic oral rinse  7 mL Mouth Rinse q12n4p  . aspirin  81 mg Oral Daily  .  ceFAZolin (ANCEF) IV  2 g Intravenous 3 times per day  . chlorhexidine  15 mL Mouth Rinse BID  . cyclobenzaprine  5 mg Oral BID  . famotidine  20 mg Oral BID  . ferrous gluconate  324 mg Oral Q breakfast  . glipiZIDE  10 mg Oral BID AC  . hydrALAZINE  25 mg Oral TID  . insulin aspart  0-9 Units Subcutaneous TID WC  . nicotine  21 mg Transdermal Daily  . polyethylene glycol  17 g Oral BID  . sodium chloride  3 mL Intravenous Q12H   Continuous Infusions: . dextrose 5 % and 0.45% NaCl 75 mL/hr at 08/12/14 1128   PRN Meds:.acetaminophen, HYDROcodone-acetaminophen, HYDROmorphone (DILAUDID) injection, promethazine    PHYSICAL EXAM: Vital signs in last 24 hours: Filed Vitals:   08/11/14 2258 08/11/14 2300 08/11/14 2342 08/12/14 0546  BP:  154/73 166/89 159/77  Pulse: 109 107 109 112  Temp: 98.8 F (37.1 C)  99.2 F (37.3 C) 99.4 F (37.4 C)  TempSrc:   Oral Oral  Resp: 15 12 20 19   Height:      Weight:      SpO2: 98% 97% 100% 95%    Weight change:  Filed Weights   08/07/14 2009  Weight: 90.22 kg (198 lb 14.4 oz)   Body mass index is 30.67 kg/(m^2).   Gen Exam: Awake and alert with clear speech.   Neck: Supple, No JVD.   Chest: B/L Clear.   CVS: S1 S2 Regular, no murmurs.  Abdomen: soft, BS +, non tender, non distended.  Extremities: no edema, lower extremities warm to touch.Both lower legs in bandage Neurologic: Non Focal.   Skin: No Rash.   Wounds: N/A.   Intake/Output from previous day:  Intake/Output Summary (Last 24 hours) at 08/12/14 1422 Last data filed at 08/12/14 1349  Gross per 24 hour  Intake 2011.25 ml  Output   1700 ml  Net 311.25 ml     LAB RESULTS: CBC  Recent Labs Lab 08/07/14 1311 08/08/14 0520 08/10/14 0450  08/11/14 0700 08/12/14 0522  WBC 11.5* 12.5* 15.0* 16.4* 18.2*  HGB 11.6* 11.4* 11.2* 9.8* 9.6*  HCT 34.7* 35.3* 33.6* 29.7* 29.1*  PLT 201 223 257 283 285  MCV 94.0 94.9 93.6 93.4 92.4  MCH 31.4 30.6 31.2 30.8 30.5  MCHC 33.4 32.3 33.3 33.0 33.0  RDW 13.1 13.2 13.1 13.3 13.6  LYMPHSABS 0.4*  --   --   --   --   MONOABS 0.4  --   --   --   --   EOSABS 0.0  --   --   --   --   BASOSABS 0.0  --   --   --   --  Chemistries   Recent Labs Lab 08/07/14 1311 08/08/14 0520 08/10/14 0450 08/11/14 0700 08/12/14 0522  NA 131* 134* 128* 133* 136  K 3.7 4.3 4.0 3.4* 3.6  CL 96 103 100 99 100  CO2 27 23 23 26 26   GLUCOSE 186* 281* 159* 158* 112*  BUN 24* 29* 30* 27* 21  CREATININE 1.66* 1.64* 1.52* 1.54* 1.42*  CALCIUM 8.4 8.5 7.9* 7.8* 8.0*    CBG:  Recent Labs Lab 08/11/14 2024 08/11/14 2341 08/12/14 0427 08/12/14 0757 08/12/14 1130  GLUCAP 69* 110* 105* 139* 107*    GFR Estimated Creatinine Clearance: 49.7 mL/min (by C-G formula based on Cr of 1.42).  Coagulation profile  Recent Labs Lab 08/07/14 1603  INR 1.11    Cardiac Enzymes  Recent Labs Lab 08/08/14 0020 08/08/14 0520  TROPONINI 0.09* 0.08*    Invalid input(s): POCBNP No results for input(s): DDIMER in the last 72 hours.  Recent Labs  08/09/14 1520  HGBA1C 8.4*   No results for input(s): CHOL, HDL, LDLCALC, TRIG, CHOLHDL, LDLDIRECT in the last 72 hours. No results for input(s): TSH, T4TOTAL, T3FREE, THYROIDAB in the last 72 hours.  Invalid input(s): FREET3 No results for input(s): VITAMINB12, FOLATE, FERRITIN, TIBC, IRON, RETICCTPCT in the last 72 hours. No results for input(s): LIPASE, AMYLASE in the last 72 hours.  Urine Studies No results for input(s): UHGB, CRYS in the last 72 hours.  Invalid input(s): UACOL, UAPR, USPG, UPH, UTP, UGL, UKET, UBIL, UNIT, UROB, ULEU, UEPI, UWBC, URBC, UBAC, CAST, UCOM, BILUA  MICROBIOLOGY: Recent Results (from the past 240 hour(s))  Culture,  blood (routine x 2)     Status: None   Collection Time: 08/09/14 12:15 PM  Result Value Ref Range Status   Specimen Description BLOOD RIGHT HAND  Final   Special Requests   Final    BOTTLES DRAWN AEROBIC AND ANAEROBIC 7CCS BOTH BOTTLES   Culture   Final    STAPHYLOCOCCUS AUREUS Note: RIFAMPIN AND GENTAMICIN SHOULD NOT BE USED AS SINGLE DRUGS FOR TREATMENT OF STAPH INFECTIONS. Note: Gram Stain Report Called to,Read Back By and Verified With: Shirlean Kelly 700AM 08/10/14 [place] Performed at [facility]    Report Status 08/12/2014 FINAL  Final   Organism ID, Bacteria STAPHYLOCOCCUS AUREUS  Final      Susceptibility   Staphylococcus aureus - MIC*    CLINDAMYCIN <=0.25 SENSITIVE Sensitive     ERYTHROMYCIN <=0.25 SENSITIVE Sensitive     GENTAMICIN <=0.5 SENSITIVE Sensitive     LEVOFLOXACIN 0.25 SENSITIVE Sensitive     OXACILLIN <=0.25 SENSITIVE Sensitive     PENICILLIN >=0.5 RESISTANT Resistant     RIFAMPIN <=0.5 SENSITIVE Sensitive     TRIMETH/SULFA <=10 SENSITIVE Sensitive     VANCOMYCIN 1 SENSITIVE Sensitive     TETRACYCLINE <=1 SENSITIVE Sensitive     MOXIFLOXACIN <=0.25 SENSITIVE Sensitive     * STAPHYLOCOCCUS AUREUS  Culture, blood (routine x 2)     Status: None   Collection Time: 08/09/14 12:25 PM  Result Value Ref Range Status   Specimen Description BLOOD RIGHT ARM  Final   Special Requests   Final    BOTTLES DRAWN AEROBIC AND ANAEROBIC 10CC BOTH BOTTLES   Culture   Final    STAPHYLOCOCCUS AUREUS Note: SUSCEPTIBILITIES PERFORMED ON PREVIOUS CULTURE WITHIN THE LAST 5 DAYS. Note: Gram Stain Report Called to,Read Back By and Verified With: Shirlean Kelly 700AM 08/10/14 [place] Performed at [facility]    Report Status 08/12/2014  FINAL  Final  Culture, routine-abscess     Status: None (Preliminary result)   Collection Time: 08/10/14 11:54 AM  Result Value Ref Range Status   Specimen Description SHOULDER LEFT  Final   Special Requests NONE  Final    Gram Stain   Final    ABUNDANT WBC PRESENT, PREDOMINANTLY PMN NO SQUAMOUS EPITHELIAL CELLS SEEN MODERATE GRAM POSITIVE COCCI IN PAIRS IN CLUSTERS Performed at [facility]    Culture   Final    MODERATE STAPHYLOCOCCUS AUREUS Note: RIFAMPIN AND GENTAMICIN SHOULD NOT BE USED AS SINGLE DRUGS FOR TREATMENT OF STAPH INFECTIONS. Performed at [facility]    Report Status PENDING  Incomplete  Anaerobic culture     Status: None (Preliminary result)   Collection Time: 08/10/14 11:54 AM  Result Value Ref Range Status   Specimen Description SHOULDER LEFT  Final   Special Requests NONE  Final   Gram Stain   Final    MODERATE WBC PRESENT, PREDOMINANTLY PMN NO SQUAMOUS EPITHELIAL CELLS SEEN MODERATE GRAM POSITIVE COCCI IN PAIRS IN CLUSTERS Performed at [facility]    Culture   Final    No Anaerobes Isolated; Culture in Progress for 14 DAYS Performed at [facility]    Report Status PENDING  Incomplete  Body fluid culture     Status: None (Preliminary result)   Collection Time: 08/10/14  5:22 PM  Result Value Ref Range Status   Specimen Description SYNOVIAL JOINT RIGHT KNEE  Final   Special Requests Normal  Final   Gram Stain   Final    ABUNDANT WBC PRESENT,BOTH PMN AND MONONUCLEAR NO ORGANISMS SEEN Performed at [facility]   Final    Culture reincubated for better growth Performed at [facility]    Report Status PENDING  Incomplete  Surgical pcr screen     Status: Abnormal   Collection Time: 08/10/14  6:42 PM  Result Value Ref Range Status   MRSA, PCR NEGATIVE NEGATIVE Final   Staphylococcus aureus POSITIVE (A) NEGATIVE Final    Comment:        The Xpert SA Assay (FDA approved for NASAL specimens in patients over 74 years of age), is one component of a comprehensive surveillance program.  Test performance has been validated by [facility] for patients greater than or equal to 37 year old. It is not intended to  diagnose infection nor to guide or monitor treatment.   Body fluid culture     Status: None (Preliminary result)   Collection Time: 08/10/14  9:14 PM  Result Value Ref Range Status   Specimen Description FLUID LEFT AC JOINT  Final   Special Requests VANCO, ROCEPHIN  Final   Gram Stain   Final    FEW WBC PRESENT, PREDOMINANTLY PMN MODERATE GRAM POSITIVE COCCI IN PAIRS IN CLUSTERS Gram Stain Report Called to,Read Back By and Verified With: Gram Stain Report Called to,Read Back By and Verified With: Laurance Flatten RN 08/11/13 9:10AM BY [place] Performed at [facility]    Culture   Final    ABUNDANT STAPHYLOCOCCUS AUREUS Note: RIFAMPIN AND GENTAMICIN SHOULD NOT BE USED AS SINGLE DRUGS FOR TREATMENT OF STAPH INFECTIONS. Performed at [facility]    Report Status PENDING  Incomplete  Anaerobic culture     Status: None (Preliminary result)   Collection Time: 08/10/14  9:14 PM  Result Value Ref Range Status   Specimen Description FLUID LEFT [facility] JOINT  Final   Special Requests VANCO, ROCEPHIN  Final   Gram Stain   Final    FEW WBC PRESENT, PREDOMINANTLY PMN MODERATE GRAM POSITIVE COCCI IN PAIRS IN CLUSTERS Performed at [facility]    Culture   Final    NO ANAEROBES ISOLATED; CULTURE IN PROGRESS FOR 5 DAYS Performed at [facility]    Report Status PENDING  Incomplete  Culture, blood (routine x 2)     Status: None (Preliminary result)   Collection Time: 08/11/14  7:00 AM  Result Value Ref Range Status   Specimen Description BLOOD RIGHT ARM  Final   Special Requests BOTTLES DRAWN AEROBIC AND ANAEROBIC 6CC  Final   Culture   Final           BLOOD CULTURE RECEIVED NO GROWTH TO DATE CULTURE WILL BE HELD FOR 5 DAYS BEFORE ISSUING A FINAL NEGATIVE REPORT Performed at [facility]    Report Status PENDING  Incomplete  Culture, blood (routine x 2)     Status: None (Preliminary result)   Collection Time: 08/11/14  7:08 AM  Result Value Ref Range  Status   Specimen Description BLOOD LEFT HAND  Final   Special Requests BOTTLES DRAWN AEROBIC AND ANAEROBIC [place]  Final   Culture   Final           BLOOD CULTURE RECEIVED NO GROWTH TO DATE CULTURE WILL BE HELD FOR 5 DAYS BEFORE ISSUING A FINAL NEGATIVE REPORT Performed at [facility]    Report Status PENDING  Incomplete    RADIOLOGY STUDIES/RESULTS: Dg Chest 2 View  08/07/2014   CLINICAL DATA:  Left shoulder pain for 5 days. No known injury. Initial encounter.  EXAM: CHEST  2 VIEW  COMPARISON:  None.  FINDINGS: The heart size and mediastinal contours are within normal limits. Both lungs are clear. No pneumothorax or pleural effusion is noted. The visualized skeletal structures are unremarkable.  IMPRESSION: No acute cardiopulmonary abnormality seen.   Electronically Signed   By: Marijo Conception, M.D.   On: 08/07/2014 15:02   Ct Angio Chest Pe W/cm &/or Wo Cm  08/07/2014   CLINICAL DATA:  Tachycardia and hypoxia. Left shoulder pain for 4 days. Renal insufficiency.  EXAM: CT ANGIOGRAPHY CHEST WITH CONTRAST  TECHNIQUE: Multidetector CT imaging of the chest was performed using the standard protocol during bolus administration of intravenous contrast. Multiplanar CT image reconstructions and MIPs were obtained to evaluate the vascular anatomy.  CONTRAST:  124mL OMNIPAQUE IOHEXOL 350 MG/ML SOLN. The patient was pre-medicated with Benadryl and Solu-Medrol due to a history of hives with Omnipaque.  COMPARISON:  08/07/2014; 10/18/2011  FINDINGS: Mediastinum/Nodes: No filling defect is identified in the pulmonary arterial tree to suggest pulmonary embolus. Mild atherosclerotic calcification of the aortic arch without acute aortic abnormality identified. No pathologic thoracic adenopathy. Mild cardiomegaly.  Lungs/Pleura: Secondary pulmonary lobular interstitial accentuation noted particularly at the lung apices. Mild atelectasis dependently in both lower lobes. No airspace opacity identified.  Upper  abdomen: Unremarkable  Musculoskeletal: Mid thoracic spondylosis and mild kyphosis.  Review of the MIP images confirms the above findings.  IMPRESSION: 1. Mild cardiomegaly with secondary pulmonary lobular interstitial accentuation compatible with mild interstitial edema. 2. No embolus identified.   Electronically Signed   By: Van Clines M.D.   On: 08/07/2014 17:25   Mr Hand Right W Wo Contrast  08/11/2014   CLINICAL DATA:  RIGHT hand swelling. Septic arthritis. Previous aspirations and debridement for septic arthritis.  EXAM: MRI OF THE RIGHT HAND WITHOUT AND WITH CONTRAST  TECHNIQUE: Multiplanar, multisequence MR imaging was performed both before and after administration of intravenous contrast.  CONTRAST:  35mL MULTIHANCE GADOBENATE DIMEGLUMINE 529 MG/ML IV SOLN  COMPARISON:  None.  FINDINGS: Edema is present over the dorsum of the hand without abscess. There is flexor and extensor tenosynovitis in the long finger, which may be septic or reactive. The other flexor and extensor compartments appear normal. No deep soft tissue abscess. Periarticular edema is present at third MCP joint however there is no chondrolysis or subchondral marrow edema identified at the third MCP joint. Small third MCP joint effusion. Septic arthritis is considered unlikely based on the lack of secondary findings. The IP joints appear within normal limits. Small cyst or prominent vascular entry zone is present in the radial aspect of the third metacarpal head.  IMPRESSION: Long finger extensor and flexor tenosynovitis, concerning for septic tenosynovitis. Inflammatory changes at the third MCP joint are likely reactive rather than septic arthritis based on the lack of marrow signal changes and chondrolysis. Cellulitis of the hand.   Electronically Signed   By: Dereck Ligas M.D.   On: 08/11/2014 12:35   Mr Shoulder Left Wo Contrast  08/09/2014   CLINICAL DATA:  Acute onset intractable left shoulder pain 08/03/2014 after are a  lifting injury.  EXAM: MRI OF THE LEFT SHOULDER WITHOUT CONTRAST  TECHNIQUE: Multiplanar, multisequence MR imaging of the shoulder was performed. No intravenous contrast was administered.  COMPARISON:  Plain films left shoulder 08/07/2014.  FINDINGS: The study is degraded by patient motion.  Rotator cuff: Supraspinatus worse than infraspinatus tendinopathy without tear is identified.  Muscles:  No atrophy or focal lesion.  Biceps long head:  Intact.  Acromioclavicular Joint: Moderate appearing acromioclavicular osteoarthritis is present. There is fluid within the joint. Intense soft tissue edema is seen about the joint and surrounding fatty and muscular tissues including the deltoid and superior aspect of the supraspinatus. No focal fluid collection is identified.  Labrum:  Intact.  Bones: As described above. No fracture is identified. The acromion is type 2. There is fluid in the subacromial/subdeltoid bursa.  IMPRESSION: Dominant finding is the abnormal appearance of the acromioclavicular joint highly suspicious for septic joint with surrounding cellulitis and myositis.  Moderate acromioclavicular osteoarthritis.  Mild supraspinatus and infraspinatus tendinopathy without tear.  These results will be called to the ordering clinician or representative by the Radiologist Assistant, and communication documented in the PACS or zVision Dashboard.   Electronically Signed   By: Inge Rise M.D.   On: 08/09/2014 08:48   Ir Fluoro Guide Ndl Plmt / Bx  08/10/2014   CLINICAL DATA:  59 year old female with left shoulder pain and evidence on recent MRI of septic arthritis of acromioclavicular joint. She has been referred for evaluation of aspiration.  EXAM: ULTRASOUND AND FLUOROSCOPIC GUIDED LEFT ACROMIOCLAVICULAR JOINT ASPIRATION.  TECHNIQUE: The procedure, risks, benefits, and alternatives were explained to the patient. Questions regarding the procedure were encouraged and answered. The patient understands and  consents to the procedure.  Fluoroscopic image of the left shoulder performed. Ultrasound survey of the shoulder performed. Image was stored and sent to PACs with both modality.  Using fluoroscopy, the skin and subcutaneous tissues overlying the left acromioclavicular joint were generously infiltrated 1% lidocaine for local anesthesia.  We then used ultrasound guidance to advance an 18 gauge needle attached to a syringe into the left acromioclavicular joint fluid. Two separate aspiration were performed. Approximately 1-2 cc  of fluid were aspirated.  Samples were sent to the lab for analysis.  Patient tolerated the procedure well and remained hemodynamically stable throughout.  No complications were encountered and no significant blood loss was encountered.  COMPLICATIONS: None  IMPRESSION: Status post fluoroscopic and ultrasound-guided left AC joint aspiration with sample sent to the lab for analysis.  Signed,  Dulcy Fanny. Earleen Newport, DO  [facility]  [facility]   Electronically Signed   By: Corrie Mckusick D.O.   On: 08/10/2014 12:07   Dg Shoulder Left  08/07/2014   CLINICAL DATA:  59 year old female with left shoulder pain after have the lifting injury on 08/03/2014. Initial encounter.  EXAM: LEFT SHOULDER - 2+ VIEW  COMPARISON:  None.  FINDINGS: Bone mineralization is within normal limits for age. No glenohumeral joint dislocation. Proximal left humerus intact. Left clavicle and scapula appear intact. Negative visible left ribs and lung parenchyma.  IMPRESSION: No acute osseous abnormality identified at the left shoulder.   Electronically Signed   By: Genevie Ann M.D.   On: 08/07/2014 11:11    Oren Binet, MD  [facility] Pager:[phone]  If 7PM-7AM, please contact night-coverage [email or web address] Password [email or web address] 08/12/2014, 2:22 PM   LOS: 5 days

## 2014-08-12 NOTE — Progress Notes (Signed)
[name], female   DOB: 1955/10/09, 59 y.o.   MRN: [id number]         [facility] for Infectious Disease    Date of Admission:  08/07/2014   Total days of antibiotics 4          Principal Problem:   Staphylococcus aureus bacteremia Active Problems:   Septic joint of left shoulder region   Septic joint of right knee joint   Septic joint of right hand   Sinus tachycardia   CKD (chronic kidney disease) stage 3, GFR 30-59 ml/min   . antiseptic oral rinse  7 mL Mouth Rinse q12n4p  . aspirin  81 mg Oral Daily  .  ceFAZolin (ANCEF) IV  2 g Intravenous 3 times per day  . chlorhexidine  15 mL Mouth Rinse BID  . cyclobenzaprine  5 mg Oral BID  . famotidine  20 mg Oral BID  . ferrous gluconate  324 mg Oral Q breakfast  . glipiZIDE  10 mg Oral BID AC  . hydrALAZINE  25 mg Oral TID  . insulin aspart  0-9 Units Subcutaneous TID WC  . nicotine  21 mg Transdermal Daily  . polyethylene glycol  17 g Oral BID  . sodium chloride  3 mL Intravenous Q12H  . vancomycin  750 mg Intravenous Q12H    Subjective: She is feeling a little bit better  Review of Systems: Pertinent items are noted in HPI.  Past Medical History  Diagnosis Date  . Diabetes mellitus   . Hypertension   . Peripheral neuropathy     History  Substance Use Topics  . Smoking status: Current Every Day Smoker -- 0.50 packs/day for 20 years  . Smokeless tobacco: Never Used  . Alcohol Use: Yes     Comment: ocassionally    Family History  Problem Relation Age of Onset  . Cancer Mother   . Heart disease Father   . Cancer Sister   . Cancer Brother   . Diabetes Brother    Allergies  Allergen Reactions  . Compazine [Prochlorperazine] Shortness Of Breath and Swelling    TONGUE SWELLS  . Omnipaque [Iohexol] Hives  . Shellfish-Derived Products Anaphylaxis  . Iodinated Diagnostic Agents Rash    OBJECTIVE: Blood pressure 159/77, pulse 112, temperature 99.4 F (37.4 C), temperature source Oral,  resp. rate 19, height 5' 7.5" (1.715 m), weight 198 lb 14.4 oz (90.22 kg), last menstrual period 03/16/2002, SpO2 95 %. General: she is alert and in no distress Skin: no splinter or conjunctival hemorrhages Lungs: clear Cor: regular S1 and S2 with an early 1/6 systolic murmur  Lab Results Lab Results  Component Value Date   WBC 18.2* 08/12/2014   HGB 9.6* 08/12/2014   HCT 29.1* 08/12/2014   MCV 92.4 08/12/2014   PLT 285 08/12/2014    Lab Results  Component Value Date   CREATININE 1.42* 08/12/2014   BUN 21 08/12/2014   NA 136 08/12/2014   K 3.6 08/12/2014   CL 100 08/12/2014   CO2 26 08/12/2014    Lab Results  Component Value Date   ALT 16 08/12/2014   AST 27 08/12/2014   ALKPHOS 97 08/12/2014   BILITOT 0.4 08/12/2014     Microbiology: Recent Results (from the past 240 hour(s))  Culture, blood (routine x 2)     Status: None   Collection Time: 08/09/14 12:15 PM  Result Value Ref Range Status   Specimen Description BLOOD RIGHT HAND  Final  Special Requests   Final    BOTTLES DRAWN AEROBIC AND ANAEROBIC 7CCS BOTH BOTTLES   Culture   Final    STAPHYLOCOCCUS AUREUS Note: RIFAMPIN AND GENTAMICIN SHOULD NOT BE USED AS SINGLE DRUGS FOR TREATMENT OF STAPH INFECTIONS. Note: Gram Stain Report Called to,Read Back By and Verified With: Shirlean Kelly 700AM 08/10/14 [place] Performed at [facility]    Report Status 08/12/2014 FINAL  Final   Organism ID, Bacteria STAPHYLOCOCCUS AUREUS  Final      Susceptibility   Staphylococcus aureus - MIC*    CLINDAMYCIN <=0.25 SENSITIVE Sensitive     ERYTHROMYCIN <=0.25 SENSITIVE Sensitive     GENTAMICIN <=0.5 SENSITIVE Sensitive     LEVOFLOXACIN 0.25 SENSITIVE Sensitive     OXACILLIN <=0.25 SENSITIVE Sensitive     PENICILLIN >=0.5 RESISTANT Resistant     RIFAMPIN <=0.5 SENSITIVE Sensitive     TRIMETH/SULFA <=10 SENSITIVE Sensitive     VANCOMYCIN 1 SENSITIVE Sensitive     TETRACYCLINE <=1 SENSITIVE Sensitive      MOXIFLOXACIN <=0.25 SENSITIVE Sensitive     * STAPHYLOCOCCUS AUREUS  Culture, blood (routine x 2)     Status: None   Collection Time: 08/09/14 12:25 PM  Result Value Ref Range Status   Specimen Description BLOOD RIGHT ARM  Final   Special Requests   Final    BOTTLES DRAWN AEROBIC AND ANAEROBIC 10CC BOTH BOTTLES   Culture   Final    STAPHYLOCOCCUS AUREUS Note: SUSCEPTIBILITIES PERFORMED ON PREVIOUS CULTURE WITHIN THE LAST 5 DAYS. Note: Gram Stain Report Called to,Read Back By and Verified With: Shirlean Kelly 700AM 08/10/14 [place] Performed at [facility]    Report Status 08/12/2014 FINAL  Final  Culture, routine-abscess     Status: None (Preliminary result)   Collection Time: 08/10/14 11:54 AM  Result Value Ref Range Status   Specimen Description SHOULDER LEFT  Final   Special Requests NONE  Final   Gram Stain   Final    ABUNDANT WBC PRESENT, PREDOMINANTLY PMN NO SQUAMOUS EPITHELIAL CELLS SEEN MODERATE GRAM POSITIVE COCCI IN PAIRS IN CLUSTERS Performed at [facility]    Culture   Final    MODERATE STAPHYLOCOCCUS AUREUS Note: RIFAMPIN AND GENTAMICIN SHOULD NOT BE USED AS SINGLE DRUGS FOR TREATMENT OF STAPH INFECTIONS. Performed at [facility]    Report Status PENDING  Incomplete  Anaerobic culture     Status: None (Preliminary result)   Collection Time: 08/10/14 11:54 AM  Result Value Ref Range Status   Specimen Description SHOULDER LEFT  Final   Special Requests NONE  Final   Gram Stain   Final    MODERATE WBC PRESENT, PREDOMINANTLY PMN NO SQUAMOUS EPITHELIAL CELLS SEEN MODERATE GRAM POSITIVE COCCI IN PAIRS IN CLUSTERS Performed at [facility]    Culture   Final    No Anaerobes Isolated; Culture in Progress for 14 DAYS Performed at [facility]    Report Status PENDING  Incomplete  Body fluid culture     Status: None (Preliminary result)   Collection Time: 08/10/14  5:22 PM  Result Value Ref Range Status   Specimen  Description SYNOVIAL JOINT RIGHT KNEE  Final   Special Requests Normal  Final   Gram Stain   Final    ABUNDANT WBC PRESENT,BOTH PMN AND MONONUCLEAR NO ORGANISMS SEEN Performed at [facility]   Final    Culture reincubated for better growth Performed at [facility]    Report Status PENDING  Incomplete  Surgical pcr screen     Status: Abnormal   Collection Time: 08/10/14  6:42 PM  Result Value Ref Range Status   MRSA, PCR NEGATIVE NEGATIVE Final   Staphylococcus aureus POSITIVE (A) NEGATIVE Final    Comment:        The Xpert SA Assay (FDA approved for NASAL specimens in patients over 14 years of age), is one component of a comprehensive surveillance program.  Test performance has been validated by [facility] for patients greater than or equal to 68 year old. It is not intended to diagnose infection nor to guide or monitor treatment.   Body fluid culture     Status: None (Preliminary result)   Collection Time: 08/10/14  9:14 PM  Result Value Ref Range Status   Specimen Description FLUID LEFT AC JOINT  Final   Special Requests VANCO, ROCEPHIN  Final   Gram Stain   Final    FEW WBC PRESENT, PREDOMINANTLY PMN MODERATE GRAM POSITIVE COCCI IN PAIRS IN CLUSTERS Gram Stain Report Called to,Read Back By and Verified With: Gram Stain Report Called to,Read Back By and Verified With: Laurance Flatten RN 08/11/13 9:10AM BY [place] Performed at [facility]    Culture NO GROWTH Performed at [facility]   Final   Report Status PENDING  Incomplete  Anaerobic culture     Status: None (Preliminary result)   Collection Time: 08/10/14  9:14 PM  Result Value Ref Range Status   Specimen Description FLUID LEFT [facility] JOINT  Final   Special Requests VANCO, ROCEPHIN  Final   Gram Stain   Final    FEW WBC PRESENT, PREDOMINANTLY PMN MODERATE GRAM POSITIVE COCCI IN PAIRS IN CLUSTERS Performed at [facility]    Culture   Final    NO ANAEROBES  ISOLATED; CULTURE IN PROGRESS FOR 5 DAYS Performed at [facility]    Report Status PENDING  Incomplete  Culture, blood (routine x 2)     Status: None (Preliminary result)   Collection Time: 08/11/14  7:00 AM  Result Value Ref Range Status   Specimen Description BLOOD RIGHT ARM  Final   Special Requests BOTTLES DRAWN AEROBIC AND ANAEROBIC 6CC  Final   Culture   Final           BLOOD CULTURE RECEIVED NO GROWTH TO DATE CULTURE WILL BE HELD FOR 5 DAYS BEFORE ISSUING A FINAL NEGATIVE REPORT Performed at [facility]    Report Status PENDING  Incomplete  Culture, blood (routine x 2)     Status: None (Preliminary result)   Collection Time: 08/11/14  7:08 AM  Result Value Ref Range Status   Specimen Description BLOOD LEFT HAND  Final   Special Requests BOTTLES DRAWN AEROBIC AND ANAEROBIC [place]  Final   Culture   Final           BLOOD CULTURE RECEIVED NO GROWTH TO DATE CULTURE WILL BE HELD FOR 5 DAYS BEFORE ISSUING A FINAL NEGATIVE REPORT Performed at [facility]    Report Status PENDING  Incomplete    Assessment: She has MSSA bacteremia complicated by multiple septic joints. I will continue cefazolin alone for now. Once repeat blood cultures are negative she can have a PICC placed. Given the severity of her infection I feel it is best that we proceed with TEE to rule out endocarditis.  Plan: 1. Continue cefazolin 2. Discontinue vancomycin 3. Hold off on  PICC placement until blood cultures are negative 4. Recommend TEE next week  Michel Bickers, MD [facility] for Infectious [place] [phone] pager   [phone] cell 08/12/2014, 1:13 PM

## 2014-08-12 NOTE — Op Note (Signed)
NAMEROSALIA, [place] NO.:  [id number]  MEDICAL RECORD NO.:  [id number]  LOCATION:  [id number]                        FACILITY:  [facility]  PHYSICIAN:  Lind Guest. Ninfa Linden, M.D.DATE OF BIRTH:  September 29, 1955  DATE OF PROCEDURE:  08/11/2014 DATE OF DISCHARGE:                              OPERATIVE REPORT   PREOPERATIVE DIAGNOSIS:  Questionable right hand and forearm infection with infectious flexor tenosynovitis of the right hand, middle finger.  POSTOPERATIVE DIAGNOSIS:  Questionable right hand and forearm infection with infectious flexor tenosynovitis of the right hand, middle finger.  PROCEDURES: 1. Irrigation and debridement of right hand flexor tendon sheath from     areas of the palm over the A1 pulley and the proximal middle     phalanx of the middle finger. 2. I and D of superficial deep tissues, right volar forearm.  FINDINGS: 1. Gross purulence of the flexor tendon sheath of the right hand     middle finger. 2. No deep infection of the right forearm, only a superficial pustule.  SURGEON:  Lind Guest. Ninfa Linden, M.D.  ANESTHESIA:  General.  TOURNIQUET TIME:  Less than 1 hour.  COMPLICATIONS:  None.  BLOOD LOSS:  Less than 50 mL.  INDICATIONS:  Ms. Ace is a 59 year old female with bacteremia that has seated multiple areas on her body.  My partner, Dr. Beatriz Stallion took her to the operating room yesterday for irrigation and debridement of bilateral AC joint infections and a knee infection.  Over the last 24 hours, she has now developed pain and swelling in her right hand.  She had an MRI today that showed flexor tenosynovitis of the right middle finger and questionable extensor tenosynovitis.  On exam, she had a swollen right middle finger and hand.  Her pain was all along the flexor tendon area of the hand.  She had exquisite pain with attempting flexion of the hand.  There was only minimal extensor-sided pain.  We talked to her in length about  irrigation and debridement of the right hand flexor tendon sheath.  She understood this completely.  PROCEDURE DESCRIPTION:  Ms. Nash was taken to the operating room and placed supine on the operating table and my partner Dr. Sherrian Divers had prepped and draped her left knee for an arthroscopic irrigation and debridement of the septic left knee joint.  At the same setting, we placed a nonsterile tourniquet, and placed around her upper right arm and her right arm was prepped and draped from the forearm down to the hand with DuraPrep and sterile drapes.  Prior to prepping, we found a pustule and redness surrounding this and the forearm.  We made a decision to open this area up given the overwhelming infection she got into her body. After prepping and draping, the tourniquet was inflated to 250 mm of pressure.  I made an incision over this pustule and did find purulent material with just the pustule, but did not track into the volar forearm at all or the deep tissues, which I minimally explored.  I did irrigate this wound with normal saline solution and reapproximated the small incision with interrupted 3-0 nylon suture.  I then went down to  the hand.  I made an incision directly over the A1 pulley of the right middle finger and dissected down to the A1 pulley.  I opened up the flexor tendon sheath and found gross purulence.  I then went down to the proximal phalanx to the middle phalanx of the middle finger and dissected down the tendon sheath there.  I then irrigated the soft tissues and deep tissues all around the hand and the flexor tendon thoroughly with normal saline solution.  I then placed a pediatric feeding tube down through the flexor tendon sheath proximally and distally into the hand and toward the distal end of the finger to thoroughly irrigate out the tendon sheath.  I put the finger through several cycles of motion of the finger during the irrigation process as well.  Once we had  fully irrigated the tendon out, we reapproximated the two incisions with interrupted 3-0 nylon suture.  Xeroform and well- padded sterile dressing were applied.  The tourniquet was let down and the fingers did pink nicely.  She was awakened, extubated and taken to the recovery room in stable condition after Dr. Herbie Baltimore arthroscopic I and D for left knee.     Lind Guest. Ninfa Linden, M.D.     CYB/MEDQ  D:  08/11/2014  T:  08/12/2014  Job:  [id number]

## 2014-08-12 NOTE — Progress Notes (Signed)
[name], female   DOB: [birth year], 59 y.o.   MRN: [id number] Reports right hand pain this am post I&D of her right hand middle finger infectious flexor tenosynovitis.  Does state the pain is a little less than yesterday.  Her knees and shoulder also feel a little better.  I did remove her hand and forearm dressing and her incisions looked good.  Her hand and fingers are swollen and I talked with her in length about trying to keep bending and straightening out her fingers as much as possible.  She is to continue on IV antibiotics for now given the extent of her bacteremia and the seeding of multiple sites.  Her vitals are stable today.

## 2014-08-12 NOTE — Progress Notes (Deleted)
ANTIBIOTIC CONSULT NOTE - FOLLOW UP  Pharmacy Consult for Vancomycin Indication: septic arthritis of AC joint   Allergies  Allergen Reactions  . Compazine [Prochlorperazine] Shortness Of Breath and Swelling    TONGUE SWELLS  . Omnipaque [Iohexol] Hives  . Shellfish-Derived Products Anaphylaxis  . Iodinated Diagnostic Agents Rash    Patient Measurements: Height: 5' 7.5" (171.5 cm) Weight: 198 lb 14.4 oz (90.22 kg) IBW/kg (Calculated) : 62.75   Vital Signs: Temp: 99.4 F (37.4 C) (02/27 0546) Temp Source: Oral (02/27 0546) BP: 159/77 mmHg (02/27 0546) Pulse Rate: 112 (02/27 0546) Intake/Output from previous day: 02/26 0701 - 02/27 0700 In: 1350 [I.V.:1200; IV Piggyback:150] Out: 2200 [Urine:2200] Intake/Output from this shift: Total I/O In: 1350 [I.V.:1200; IV Piggyback:150] Out: 800 [Urine:800]  Labs:  Recent Labs  08/10/14 0450 08/11/14 0700 08/12/14 0522  WBC 15.0* 16.4* 18.2*  HGB 11.2* 9.8* 9.6*  PLT 257 283 285  CREATININE 1.52* 1.54* 1.42*   Estimated Creatinine Clearance: 49.7 mL/min (by C-G formula based on Cr of 1.42).  Recent Labs  08/12/14 0530  VANCOTROUGH 23.6*     Microbiology: Recent Results (from the past 720 hour(s))  Culture, blood (routine x 2)     Status: None (Preliminary result)   Collection Time: 08/09/14 12:15 PM  Result Value Ref Range Status   Specimen Description BLOOD RIGHT HAND  Final   Special Requests   Final    BOTTLES DRAWN AEROBIC AND ANAEROBIC 7CCS BOTH BOTTLES   Culture   Final    STAPHYLOCOCCUS AUREUS Note: RIFAMPIN AND GENTAMICIN SHOULD NOT BE USED AS SINGLE DRUGS FOR TREATMENT OF STAPH INFECTIONS. Note: Gram Stain Report Called to,Read Back By and Verified With: Shirlean Kelly 700AM 08/10/14 [place] Performed at [facility]    Report Status PENDING  Incomplete  Culture, blood (routine x 2)     Status: None (Preliminary result)   Collection Time: 08/09/14 12:25 PM  Result Value Ref Range Status    Specimen Description BLOOD RIGHT ARM  Final   Special Requests   Final    BOTTLES DRAWN AEROBIC AND ANAEROBIC 10CC BOTH BOTTLES   Culture   Final    STAPHYLOCOCCUS AUREUS Note: Gram Stain Report Called to,Read Back By and Verified With: Shirlean Kelly 700AM 08/10/14 [place] Performed at [facility]    Report Status PENDING  Incomplete  Culture, routine-abscess     Status: None (Preliminary result)   Collection Time: 08/10/14 11:54 AM  Result Value Ref Range Status   Specimen Description SHOULDER LEFT  Final   Special Requests NONE  Final   Gram Stain   Final    ABUNDANT WBC PRESENT, PREDOMINANTLY PMN NO SQUAMOUS EPITHELIAL CELLS SEEN MODERATE GRAM POSITIVE COCCI IN PAIRS IN CLUSTERS Performed at [facility]    Culture PENDING  Incomplete   Report Status PENDING  Incomplete  Anaerobic culture     Status: None (Preliminary result)   Collection Time: 08/10/14 11:54 AM  Result Value Ref Range Status   Specimen Description SHOULDER LEFT  Final   Special Requests NONE  Final   Gram Stain   Final    MODERATE WBC PRESENT, PREDOMINANTLY PMN NO SQUAMOUS EPITHELIAL CELLS SEEN MODERATE GRAM POSITIVE COCCI IN PAIRS IN CLUSTERS Performed at [facility]    Culture   Final    No Anaerobes Isolated; Culture in Progress for 14 DAYS Performed at [facility]    Report Status PENDING  Incomplete  Body fluid culture  Status: None (Preliminary result)   Collection Time: 08/10/14  5:22 PM  Result Value Ref Range Status   Specimen Description SYNOVIAL JOINT RIGHT KNEE  Final   Special Requests Normal  Final   Gram Stain   Final    ABUNDANT WBC PRESENT,BOTH PMN AND MONONUCLEAR NO ORGANISMS SEEN Performed at [facility]    Culture NO GROWTH Performed at [facility]   Final   Report Status PENDING  Incomplete  Surgical pcr screen     Status: Abnormal   Collection Time: 08/10/14  6:42 PM  Result Value Ref Range Status   MRSA,  PCR NEGATIVE NEGATIVE Final   Staphylococcus aureus POSITIVE (A) NEGATIVE Final    Comment:        The Xpert SA Assay (FDA approved for NASAL specimens in patients over 33 years of age), is one component of a comprehensive surveillance program.  Test performance has been validated by [facility] for patients greater than or equal to 56 year old. It is not intended to diagnose infection nor to guide or monitor treatment.   Body fluid culture     Status: None (Preliminary result)   Collection Time: 08/10/14  9:14 PM  Result Value Ref Range Status   Specimen Description FLUID LEFT AC JOINT  Final   Special Requests VANCO, ROCEPHIN  Final   Gram Stain   Final    FEW WBC PRESENT, PREDOMINANTLY PMN MODERATE GRAM POSITIVE COCCI IN PAIRS IN CLUSTERS Gram Stain Report Called to,Read Back By and Verified With: Gram Stain Report Called to,Read Back By and Verified With: Laurance Flatten RN 08/11/13 9:10AM BY [place] Performed at [facility]    Culture NO GROWTH Performed at [facility]   Final   Report Status PENDING  Incomplete  Anaerobic culture     Status: None (Preliminary result)   Collection Time: 08/10/14  9:14 PM  Result Value Ref Range Status   Specimen Description FLUID LEFT [facility] JOINT  Final   Special Requests VANCO, ROCEPHIN  Final   Gram Stain   Final    FEW WBC PRESENT, PREDOMINANTLY PMN MODERATE GRAM POSITIVE COCCI IN PAIRS IN CLUSTERS Performed at [facility]    Culture   Final    NO ANAEROBES ISOLATED; CULTURE IN PROGRESS FOR 5 DAYS Performed at [facility]    Report Status PENDING  Incomplete    Anti-infectives    Start     Dose/Rate Route Frequency Ordered Stop   08/10/14 2138  vancomycin (VANCOCIN) powder  Status:  Discontinued       As needed 08/10/14 2138 08/10/14 2153   08/10/14 1600  cefTRIAXone (ROCEPHIN) 2 g in dextrose 5 % 50 mL IVPB - Premix  Status:  Discontinued     2 g 100 mL/hr over 30 Minutes Intravenous Every  24 hours 08/09/14 1555 08/10/14 1432   08/10/14 1600  ceFAZolin (ANCEF) IVPB 2 g/50 mL premix     2 g 100 mL/hr over 30 Minutes Intravenous 3 times per day 08/10/14 1432     08/10/14 0600  vancomycin (VANCOCIN) IVPB 750 mg/150 ml premix     750 mg 150 mL/hr over 60 Minutes Intravenous Every 12 hours 08/09/14 2018     08/09/14 1800  vancomycin (VANCOCIN) 2,000 mg in sodium chloride 0.9 % 500 mL IVPB     2,000 mg 250 mL/hr over 120 Minutes Intravenous  Once 08/09/14 1616 08/09/14 1929   08/09/14 1630  cefTRIAXone (ROCEPHIN) 2  g in dextrose 5 % 50 mL IVPB - Premix     2 g 100 mL/hr over 30 Minutes Intravenous  Once 08/09/14 1553 08/09/14 1653      Assessment: Vancomycin trough 23.6 on 750mg  IV q12h septic arthritis of AC joint.  Trough above goal.  Today's 6am dose already given. Tc 99.4, Tm 99.4, WBC 18.2k  Goal of Therapy:  Vancomycin trough 15-20  Plan:  Decrease vancomycin to 500 mg IV q12h  [name] Cella, RPh Clinical Pharmacist Pager: [phone] 08/12/2014,6:45 AM  ADDENDUM:  See pharmacy note by Wynona Neat, Pharmacist.  Vanc trough result appears high but last dose was given 2hr late so true trough is ~18). Will continue vancomycin and continue to monitor.   [name] Cella, RPh Clinical Pharmacist Pager: [phone] 08/12/2014, 07:18 AM

## 2014-08-12 NOTE — Progress Notes (Signed)
ANTIBIOTIC CONSULT NOTE - FOLLOW UP  Pharmacy Consult for vancomycin Indication: septic joint   Labs:  Recent Labs  08/10/14 0450 08/11/14 0700 08/12/14 0522  WBC 15.0* 16.4* 18.2*  HGB 11.2* 9.8* 9.6*  PLT 257 283 285  CREATININE 1.52* 1.54* 1.42*   Estimated Creatinine Clearance: 49.7 mL/min (by C-G formula based on Cr of 1.42).  Recent Labs  08/12/14 0530  VANCOTROUGH 23.6*     Microbiology: Recent Results (from the past 720 hour(s))  Culture, blood (routine x 2)     Status: None (Preliminary result)   Collection Time: 08/09/14 12:15 PM  Result Value Ref Range Status   Specimen Description BLOOD RIGHT HAND  Final   Special Requests   Final    BOTTLES DRAWN AEROBIC AND ANAEROBIC 7CCS BOTH BOTTLES   Culture   Final    STAPHYLOCOCCUS AUREUS Note: RIFAMPIN AND GENTAMICIN SHOULD NOT BE USED AS SINGLE DRUGS FOR TREATMENT OF STAPH INFECTIONS. Note: Gram Stain Report Called to,Read Back By and Verified With: Shirlean Kelly 700AM 08/10/14 [place] Performed at [facility]    Report Status PENDING  Incomplete  Culture, blood (routine x 2)     Status: None (Preliminary result)   Collection Time: 08/09/14 12:25 PM  Result Value Ref Range Status   Specimen Description BLOOD RIGHT ARM  Final   Special Requests   Final    BOTTLES DRAWN AEROBIC AND ANAEROBIC 10CC BOTH BOTTLES   Culture   Final    STAPHYLOCOCCUS AUREUS Note: Gram Stain Report Called to,Read Back By and Verified With: Shirlean Kelly 700AM 08/10/14 [place] Performed at [facility]    Report Status PENDING  Incomplete  Culture, routine-abscess     Status: None (Preliminary result)   Collection Time: 08/10/14 11:54 AM  Result Value Ref Range Status   Specimen Description SHOULDER LEFT  Final   Special Requests NONE  Final   Gram Stain   Final    ABUNDANT WBC PRESENT, PREDOMINANTLY PMN NO SQUAMOUS EPITHELIAL CELLS SEEN MODERATE GRAM POSITIVE COCCI IN PAIRS IN CLUSTERS Performed at  [facility]    Culture PENDING  Incomplete   Report Status PENDING  Incomplete  Anaerobic culture     Status: None (Preliminary result)   Collection Time: 08/10/14 11:54 AM  Result Value Ref Range Status   Specimen Description SHOULDER LEFT  Final   Special Requests NONE  Final   Gram Stain   Final    MODERATE WBC PRESENT, PREDOMINANTLY PMN NO SQUAMOUS EPITHELIAL CELLS SEEN MODERATE GRAM POSITIVE COCCI IN PAIRS IN CLUSTERS Performed at [facility]    Culture   Final    No Anaerobes Isolated; Culture in Progress for 14 DAYS Performed at [facility]    Report Status PENDING  Incomplete  Body fluid culture     Status: None (Preliminary result)   Collection Time: 08/10/14  5:22 PM  Result Value Ref Range Status   Specimen Description SYNOVIAL JOINT RIGHT KNEE  Final   Special Requests Normal  Final   Gram Stain   Final    ABUNDANT WBC PRESENT,BOTH PMN AND MONONUCLEAR NO ORGANISMS SEEN Performed at [facility]    Culture NO GROWTH Performed at [facility]   Final   Report Status PENDING  Incomplete  Surgical pcr screen     Status: Abnormal   Collection Time: 08/10/14  6:42 PM  Result Value Ref Range Status   MRSA, PCR NEGATIVE NEGATIVE Final  Staphylococcus aureus POSITIVE (A) NEGATIVE Final    Comment:        The Xpert SA Assay (FDA approved for NASAL specimens in patients over 34 years of age), is one component of a comprehensive surveillance program.  Test performance has been validated by [facility] for patients greater than or equal to 75 year old. It is not intended to diagnose infection nor to guide or monitor treatment.   Body fluid culture     Status: None (Preliminary result)   Collection Time: 08/10/14  9:14 PM  Result Value Ref Range Status   Specimen Description FLUID LEFT AC JOINT  Final   Special Requests VANCO, ROCEPHIN  Final   Gram Stain   Final    FEW WBC PRESENT, PREDOMINANTLY PMN MODERATE GRAM  POSITIVE COCCI IN PAIRS IN CLUSTERS Gram Stain Report Called to,Read Back By and Verified With: Gram Stain Report Called to,Read Back By and Verified With: Laurance Flatten RN 08/11/13 9:10AM BY [place] Performed at [facility]    Culture NO GROWTH Performed at [facility]   Final   Report Status PENDING  Incomplete  Anaerobic culture     Status: None (Preliminary result)   Collection Time: 08/10/14  9:14 PM  Result Value Ref Range Status   Specimen Description FLUID LEFT [facility] JOINT  Final   Special Requests VANCO, ROCEPHIN  Final   Gram Stain   Final    FEW WBC PRESENT, PREDOMINANTLY PMN MODERATE GRAM POSITIVE COCCI IN PAIRS IN CLUSTERS Performed at [facility]    Culture   Final    NO ANAEROBES ISOLATED; CULTURE IN PROGRESS FOR 5 DAYS Performed at [facility]    Report Status PENDING  Incomplete     Assessment/Plan:  59yo female therapeutic on vancomycin with initial dosing for septic joint (lab result appears high but last dose was given 2hr late so true trough is ~18). Will continue vancomycin and continue to monitor.   Wynona Neat, PharmD, BCPS  08/12/2014,6:41 AM

## 2014-08-13 LAB — URINE CULTURE: Colony Count: 3000

## 2014-08-13 LAB — BODY FLUID CULTURE

## 2014-08-13 LAB — BASIC METABOLIC PANEL
Anion gap: 11 (ref 5–15)
BUN: 23 mg/dL (ref 6–23)
CO2: 24 mmol/L (ref 19–32)
Calcium: 8.2 mg/dL — ABNORMAL LOW (ref 8.4–10.5)
Chloride: 98 mmol/L (ref 96–112)
Creatinine, Ser: 1.35 mg/dL — ABNORMAL HIGH (ref 0.50–1.10)
GFR calc Af Amer: 49 mL/min — ABNORMAL LOW (ref >90)
GFR calc non Af Amer: 42 mL/min — ABNORMAL LOW (ref >90)
Glucose, Bld: 164 mg/dL — ABNORMAL HIGH (ref 70–99)
Potassium: 3.4 mmol/L — ABNORMAL LOW (ref 3.5–5.1)
Sodium: 133 mmol/L — ABNORMAL LOW (ref 135–145)

## 2014-08-13 LAB — GLUCOSE, CAPILLARY
Glucose-Capillary: 166 mg/dL — ABNORMAL HIGH (ref 70–99)
Glucose-Capillary: 202 mg/dL — ABNORMAL HIGH (ref 70–99)
Glucose-Capillary: 178 mg/dL — ABNORMAL HIGH (ref 70–99)
Glucose-Capillary: 166 mg/dL — ABNORMAL HIGH (ref 70–99)

## 2014-08-13 LAB — CULTURE, ROUTINE-ABSCESS

## 2014-08-13 LAB — CBC
HCT: 29.6 % — ABNORMAL LOW (ref 36.0–46.0)
Hemoglobin: 9.9 g/dL — ABNORMAL LOW (ref 12.0–15.0)
MCH: 30.8 pg (ref 26.0–34.0)
MCHC: 33.4 g/dL (ref 30.0–36.0)
MCV: 92.2 fL (ref 78.0–100.0)
Platelets: 308 10*3/uL (ref 150–400)
RBC: 3.21 MIL/uL — ABNORMAL LOW (ref 3.87–5.11)
RDW: 13.3 % (ref 11.5–15.5)
WBC: 19.1 10*3/uL — ABNORMAL HIGH (ref 4.0–10.5)

## 2014-08-13 NOTE — Progress Notes (Signed)
[name], female   DOB: 24-Sep-1955, 59 y.o.   MRN: [id number]     Subjective: 2 Days Post-Op Procedure(s) (LRB): RIGHT WRIST IRRIGATION AND DEBRIDEMENT, TENOSYNOVECTOMY (Right) ARTHROSCOPIC WASHOUT LEFT KNEE (Left) Awake, alert and oriented x 4. Awaiting cardiac imaging. Patient reports pain as moderate.    Objective:   VITALS:  Temp:  [98 F (36.7 C)-98.9 F (37.2 C)] 98 F (36.7 C) (02/28 0522) Pulse Rate:  [93-103] 102 (02/27 2129) Resp:  [15-18] 15 (02/28 0522) BP: (131-157)/(66-93) 131/71 mmHg (02/28 0522) SpO2:  [92 %-100 %] 100 % (02/28 0522)  Neurologically intact ABD soft Neurovascular intact Sensation intact distally Intact pulses distally Dorsiflexion/Plantar flexion intact Incision: dressing C/D/I, scant drainage and Left knee dressing changed, drain suture and drain removed, no drainage. Right knee dressing changed. No cellulitis present Compartment soft Bilateral A-C jt I&D dressings changed, dry, no erythrema.   LABS  Recent Labs  08/11/14 0700 08/12/14 0522 08/13/14 0809  HGB 9.8* 9.6* 9.9*  WBC 16.4* 18.2* 19.1*  PLT 283 285 308    Recent Labs  08/12/14 0522 08/13/14 0809  NA 136 133*  K 3.6 3.4*  CL 100 98  CO2 26 24  BUN 21 23  CREATININE 1.42* 1.35*  GLUCOSE 112* 164*   No results for input(s): LABPT, INR in the last 72 hours.   Assessment/Plan: 2 Days Post-Op Procedure(s) (LRB): RIGHT WRIST IRRIGATION AND DEBRIDEMENT, TENOSYNOVECTOMY (Right) ARTHROSCOPIC WASHOUT LEFT KNEE (Left)  Advance diet Up with therapy Continue ABX therapy due to multifocal septic arthritis infection  NITKA,JAMES E 08/13/2014, 11:53 AM

## 2014-08-13 NOTE — Progress Notes (Signed)
PATIENT DETAILS Name: Brandy Houston Age: 59 y.o. Sex: female Date of Birth: [birth year] Admit Date: 08/07/2014 Admitting Physician Eber Ritson, MD [id number], Mateo Flow, NP  Subjective: Pain at multiple site where I&D was done.  No other issues  Assessment/Plan: Principal Problem:   MSSA bacteremia with Polyarticular septic arthrits:Initally admitted with acute left shoulder pain, further work up with a MRI of left shoulder demonstrated a septic AC joint.Subsequent blood cultures positive for MSSA. ID and Orthopedics were consulted. Blood cultures on 2/24 was also positive for MSSA. Patient meanwhile started having pain/swelling of her B/L knee joints, Right should joint  as well. Further workup demonstarted septic arthritis of these joints.Patient subsequently underwent irrigation and debridement of Right knee, left knee, Right AC & left AC joints, and Irrigation and debridement of right hand flexor tendon sheath. ID directing ABx-now just on IV Ancef. Will consult cards for TEE on Monday.PICC line will be placed prior to discharge. Repeat Blood cultures on 2/26-negative so far  Active Problems:   SIR's:secondary to above. Better with IVF and IV Abx. Still has leukocytosis, not surprising given significant infectious burden.    Anemia:secondary to acute illness/CKD. Follow CBC periodically    DM-2:CBG's controlled with SSI. Stop Glipizide and resume on discharge. A1c 8.4. Follow    HTN: Controlled with Hydralazine. Follow and titrate medications accordingly    Acute on CKD Stage 3: likely ARF-from pre-renal azotemia. Creatinine better, follow and decrease IV fluids    Tobacco Abuse:transdermal nicotine, counseled  Disposition: Remain inpatient  Antibiotics:  See below   Anti-infectives    Start     Dose/Rate Route Frequency Ordered Stop   08/12/14 1800  vancomycin (VANCOCIN) 500 mg in sodium chloride 0.9 % 100 mL IVPB  Status:  Discontinued     500 mg 100 mL/hr  over 60 Minutes Intravenous Every 12 hours 08/12/14 0705 08/12/14 0721   08/12/14 1800  vancomycin (VANCOCIN) IVPB 750 mg/150 ml premix  Status:  Discontinued     750 mg 150 mL/hr over 60 Minutes Intravenous Every 12 hours 08/12/14 0721 08/12/14 1315   08/10/14 2138  vancomycin (VANCOCIN) powder  Status:  Discontinued       As needed 08/10/14 2138 08/10/14 2153   08/10/14 1600  cefTRIAXone (ROCEPHIN) 2 g in dextrose 5 % 50 mL IVPB - Premix  Status:  Discontinued     2 g 100 mL/hr over 30 Minutes Intravenous Every 24 hours 08/09/14 1555 08/10/14 1432   08/10/14 1600  ceFAZolin (ANCEF) IVPB 2 g/50 mL premix     2 g 100 mL/hr over 30 Minutes Intravenous 3 times per day 08/10/14 1432     08/10/14 0600  vancomycin (VANCOCIN) IVPB 750 mg/150 ml premix  Status:  Discontinued     750 mg 150 mL/hr over 60 Minutes Intravenous Every 12 hours 08/09/14 2018 08/12/14 0705   08/09/14 1800  vancomycin (VANCOCIN) 2,000 mg in sodium chloride 0.9 % 500 mL IVPB     2,000 mg 250 mL/hr over 120 Minutes Intravenous  Once 08/09/14 1616 08/09/14 1929   08/09/14 1630  cefTRIAXone (ROCEPHIN) 2 g in dextrose 5 % 50 mL IVPB - Premix     2 g 100 mL/hr over 30 Minutes Intravenous  Once 08/09/14 1553 08/09/14 1653      DVT Prophylaxis: Prophylactic Heparin   Code Status: Full code   Family Communication None at bedside  Procedures:  2/25: IR left AC aspiration of joint fluid  with 1-2cc fluid aspirated  2/25: Right knee aspirated with 100 cc of turbid fluid   2/25: Arthroscopic I&D of R knee with synovectomy  2/25: Arthrotomy of right AC joint with debridement of bone, SQ tissue, and muscle  2/25: Arthrotomy of left AC joint with I&D of SQ tissue and muscle  2/26: Irrigation and debridement of right hand flexor tendon sheath from areas of the palm over the A1 pulley and the proximal middle phalanx of the middle finger.  2/26:I and D of superficial deep tissues, right volar  forearm.  2/26Arthroscopic irrigation and debridement of septic arthritis of left knee  CONSULTS:  ID and orthopedic surgery   MEDICATIONS: Scheduled Meds: . antiseptic oral rinse  7 mL Mouth Rinse q12n4p  . aspirin  81 mg Oral Daily  .  ceFAZolin (ANCEF) IV  2 g Intravenous 3 times per day  . chlorhexidine  15 mL Mouth Rinse BID  . cyclobenzaprine  5 mg Oral BID  . famotidine  20 mg Oral BID  . ferrous gluconate  324 mg Oral Q breakfast  . heparin subcutaneous  5,000 Units Subcutaneous 3 times per day  . hydrALAZINE  25 mg Oral TID  . insulin aspart  0-9 Units Subcutaneous TID WC  . nicotine  21 mg Transdermal Daily  . polyethylene glycol  17 g Oral BID  . sodium chloride  3 mL Intravenous Q12H   Continuous Infusions: . dextrose 5 % and 0.45% NaCl 75 mL/hr at 08/12/14 1128   PRN Meds:.acetaminophen, HYDROcodone-acetaminophen, HYDROmorphone (DILAUDID) injection, promethazine    PHYSICAL EXAM: Vital signs in last 24 hours: Filed Vitals:   08/12/14 1531 08/12/14 1636 08/12/14 2129 08/13/14 0522  BP: 157/93 150/78 151/66 131/71  Pulse: 93 103 102   Temp:  98.7 F (37.1 C) 98.9 F (37.2 C) 98 F (36.7 C)  TempSrc:  Oral Oral Oral  Resp:  18 16 15   Height:      Weight:      SpO2:  92% 95% 100%    Weight change:  Filed Weights   08/07/14 2009  Weight: 90.22 kg (198 lb 14.4 oz)   Body mass index is 30.67 kg/(m^2).   Gen Exam: Awake and alert with clear speech.  Not in any distress Neck: Supple, No JVD.   Chest: B/L Clear.  No rales or rhonchi CVS: S1 S2 Regular, no murmurs.  Abdomen: soft, BS +, non tender, non distended.  Extremities: no edema, lower extremities warm to touch.Both lower legs in bandage Neurologic: Non Focal.   Skin: No Rash.   Wounds: N/A.   Intake/Output from previous day:  Intake/Output Summary (Last 24 hours) at 08/13/14 1159 Last data filed at 08/13/14 0958  Gross per 24 hour  Intake 1953.75 ml  Output    683 ml  Net 1270.75 ml      LAB RESULTS: CBC  Recent Labs Lab 08/07/14 1311 08/08/14 0520 08/10/14 0450 08/11/14 0700 08/12/14 0522 08/13/14 0809  WBC 11.5* 12.5* 15.0* 16.4* 18.2* 19.1*  HGB 11.6* 11.4* 11.2* 9.8* 9.6* 9.9*  HCT 34.7* 35.3* 33.6* 29.7* 29.1* 29.6*  PLT 201 223 257 283 285 308  MCV 94.0 94.9 93.6 93.4 92.4 92.2  MCH 31.4 30.6 31.2 30.8 30.5 30.8  MCHC 33.4 32.3 33.3 33.0 33.0 33.4  RDW 13.1 13.2 13.1 13.3 13.6 13.3  LYMPHSABS 0.4*  --   --   --   --   --   MONOABS 0.4  --   --   --   --   --  EOSABS 0.0  --   --   --   --   --   BASOSABS 0.0  --   --   --   --   --     Chemistries   Recent Labs Lab 08/08/14 0520 08/10/14 0450 08/11/14 0700 08/12/14 0522 08/13/14 0809  NA 134* 128* 133* 136 133*  K 4.3 4.0 3.4* 3.6 3.4*  CL 103 100 99 100 98  CO2 23 23 26 26 24   GLUCOSE 281* 159* 158* 112* 164*  BUN 29* 30* 27* 21 23  CREATININE 1.64* 1.52* 1.54* 1.42* 1.35*  CALCIUM 8.5 7.9* 7.8* 8.0* 8.2*    CBG:  Recent Labs Lab 08/12/14 0757 08/12/14 1130 08/12/14 1800 08/12/14 2127 08/13/14 0838  GLUCAP 139* 107* 171* 184* 166*    GFR Estimated Creatinine Clearance: 52.3 mL/min (by C-G formula based on Cr of 1.35).  Coagulation profile  Recent Labs Lab 08/07/14 1603  INR 1.11    Cardiac Enzymes  Recent Labs Lab 08/08/14 0020 08/08/14 0520  TROPONINI 0.09* 0.08*    Invalid input(s): POCBNP No results for input(s): DDIMER in the last 72 hours. No results for input(s): HGBA1C in the last 72 hours. No results for input(s): CHOL, HDL, LDLCALC, TRIG, CHOLHDL, LDLDIRECT in the last 72 hours. No results for input(s): TSH, T4TOTAL, T3FREE, THYROIDAB in the last 72 hours.  Invalid input(s): FREET3 No results for input(s): VITAMINB12, FOLATE, FERRITIN, TIBC, IRON, RETICCTPCT in the last 72 hours. No results for input(s): LIPASE, AMYLASE in the last 72 hours.  Urine Studies No results for input(s): UHGB, CRYS in the last 72 hours.  Invalid input(s):  UACOL, UAPR, USPG, UPH, UTP, UGL, UKET, UBIL, UNIT, UROB, ULEU, UEPI, UWBC, URBC, UBAC, CAST, UCOM, BILUA  MICROBIOLOGY: Recent Results (from the past 240 hour(s))  Culture, blood (routine x 2)     Status: None   Collection Time: 08/09/14 12:15 PM  Result Value Ref Range Status   Specimen Description BLOOD RIGHT HAND  Final   Special Requests   Final    BOTTLES DRAWN AEROBIC AND ANAEROBIC 7CCS BOTH BOTTLES   Culture   Final    STAPHYLOCOCCUS AUREUS Note: RIFAMPIN AND GENTAMICIN SHOULD NOT BE USED AS SINGLE DRUGS FOR TREATMENT OF STAPH INFECTIONS. Note: Gram Stain Report Called to,Read Back By and Verified With: Shirlean Kelly 700AM 08/10/14 [place] Performed at [facility]    Report Status 08/12/2014 FINAL  Final   Organism ID, Bacteria STAPHYLOCOCCUS AUREUS  Final      Susceptibility   Staphylococcus aureus - MIC*    CLINDAMYCIN <=0.25 SENSITIVE Sensitive     ERYTHROMYCIN <=0.25 SENSITIVE Sensitive     GENTAMICIN <=0.5 SENSITIVE Sensitive     LEVOFLOXACIN 0.25 SENSITIVE Sensitive     OXACILLIN <=0.25 SENSITIVE Sensitive     PENICILLIN >=0.5 RESISTANT Resistant     RIFAMPIN <=0.5 SENSITIVE Sensitive     TRIMETH/SULFA <=10 SENSITIVE Sensitive     VANCOMYCIN 1 SENSITIVE Sensitive     TETRACYCLINE <=1 SENSITIVE Sensitive     MOXIFLOXACIN <=0.25 SENSITIVE Sensitive     * STAPHYLOCOCCUS AUREUS  Culture, blood (routine x 2)     Status: None   Collection Time: 08/09/14 12:25 PM  Result Value Ref Range Status   Specimen Description BLOOD RIGHT ARM  Final   Special Requests   Final    BOTTLES DRAWN AEROBIC AND ANAEROBIC 10CC BOTH BOTTLES   Culture   Final    STAPHYLOCOCCUS AUREUS Note: SUSCEPTIBILITIES  PERFORMED ON PREVIOUS CULTURE WITHIN THE LAST 5 DAYS. Note: Gram Stain Report Called to,Read Back By and Verified With: Shirlean Kelly 700AM 08/10/14 [place] Performed at [facility]    Report Status 08/12/2014 FINAL  Final  Culture, routine-abscess      Status: None   Collection Time: 08/10/14 11:54 AM  Result Value Ref Range Status   Specimen Description SHOULDER LEFT  Final   Special Requests NONE  Final   Gram Stain   Final    ABUNDANT WBC PRESENT, PREDOMINANTLY PMN NO SQUAMOUS EPITHELIAL CELLS SEEN MODERATE GRAM POSITIVE COCCI IN PAIRS IN CLUSTERS Performed at [facility]    Culture   Final    MODERATE STAPHYLOCOCCUS AUREUS Note: RIFAMPIN AND GENTAMICIN SHOULD NOT BE USED AS SINGLE DRUGS FOR TREATMENT OF STAPH INFECTIONS. Performed at [facility]    Report Status 08/13/2014 FINAL  Final   Organism ID, Bacteria STAPHYLOCOCCUS AUREUS  Final      Susceptibility   Staphylococcus aureus - MIC*    CLINDAMYCIN <=0.25 SENSITIVE Sensitive     ERYTHROMYCIN <=0.25 SENSITIVE Sensitive     GENTAMICIN <=0.5 SENSITIVE Sensitive     LEVOFLOXACIN 0.25 SENSITIVE Sensitive     OXACILLIN 0.5 SENSITIVE Sensitive     PENICILLIN >=0.5 RESISTANT Resistant     RIFAMPIN <=0.5 SENSITIVE Sensitive     TRIMETH/SULFA <=10 SENSITIVE Sensitive     VANCOMYCIN 1 SENSITIVE Sensitive     TETRACYCLINE <=1 SENSITIVE Sensitive     MOXIFLOXACIN <=0.25 SENSITIVE Sensitive     * MODERATE STAPHYLOCOCCUS AUREUS  Anaerobic culture     Status: None (Preliminary result)   Collection Time: 08/10/14 11:54 AM  Result Value Ref Range Status   Specimen Description SHOULDER LEFT  Final   Special Requests NONE  Final   Gram Stain   Final    MODERATE WBC PRESENT, PREDOMINANTLY PMN NO SQUAMOUS EPITHELIAL CELLS SEEN MODERATE GRAM POSITIVE COCCI IN PAIRS IN CLUSTERS Performed at [facility]    Culture   Final    No Anaerobes Isolated; Culture in Progress for 14 DAYS Performed at [facility]    Report Status PENDING  Incomplete  Body fluid culture     Status: None (Preliminary result)   Collection Time: 08/10/14  5:22 PM  Result Value Ref Range Status   Specimen Description SYNOVIAL JOINT RIGHT KNEE  Final   Special Requests  Normal  Final   Gram Stain   Final    ABUNDANT WBC PRESENT,BOTH PMN AND MONONUCLEAR NO ORGANISMS SEEN Performed at [facility]    Culture   Final    FEW STAPHYLOCOCCUS AUREUS Note: RIFAMPIN AND GENTAMICIN SHOULD NOT BE USED AS SINGLE DRUGS FOR TREATMENT OF STAPH INFECTIONS. CRITICAL RESULT CALLED TO, READ BACK BY AND VERIFIED WITH: CINDY FLORES RN 08/13/14 AT 83 AM BY [facility] Performed at [facility]    Report Status PENDING  Incomplete  Surgical pcr screen     Status: Abnormal   Collection Time: 08/10/14  6:42 PM  Result Value Ref Range Status   MRSA, PCR NEGATIVE NEGATIVE Final   Staphylococcus aureus POSITIVE (A) NEGATIVE Final    Comment:        The Xpert SA Assay (FDA approved for NASAL specimens in patients over 54 years of age), is one component of a comprehensive surveillance program.  Test performance has been validated by [facility] for patients greater than or equal to 65 year old. It is not intended  to diagnose infection nor to guide or monitor treatment.   Body fluid culture     Status: None   Collection Time: 08/10/14  9:14 PM  Result Value Ref Range Status   Specimen Description FLUID LEFT AC JOINT  Final   Special Requests VANCO, ROCEPHIN  Final   Gram Stain   Final    FEW WBC PRESENT, PREDOMINANTLY PMN MODERATE GRAM POSITIVE COCCI IN PAIRS IN CLUSTERS Gram Stain Report Called to,Read Back By and Verified With: Gram Stain Report Called to,Read Back By and Verified With: Laurance Flatten RN 08/11/13 9:10AM BY [place] Performed at [facility]    Culture   Final    ABUNDANT STAPHYLOCOCCUS AUREUS Note: RIFAMPIN AND GENTAMICIN SHOULD NOT BE USED AS SINGLE DRUGS FOR TREATMENT OF STAPH INFECTIONS. Performed at [facility]    Report Status 08/13/2014 FINAL  Final   Organism ID, Bacteria STAPHYLOCOCCUS AUREUS  Final      Susceptibility   Staphylococcus aureus - MIC*    CLINDAMYCIN <=0.25 SENSITIVE Sensitive     ERYTHROMYCIN  <=0.25 SENSITIVE Sensitive     GENTAMICIN <=0.5 SENSITIVE Sensitive     LEVOFLOXACIN 0.25 SENSITIVE Sensitive     OXACILLIN <=0.25 SENSITIVE Sensitive     PENICILLIN >=0.5 RESISTANT Resistant     RIFAMPIN <=0.5 SENSITIVE Sensitive     TRIMETH/SULFA <=10 SENSITIVE Sensitive     VANCOMYCIN 1 SENSITIVE Sensitive     TETRACYCLINE <=1 SENSITIVE Sensitive     MOXIFLOXACIN <=0.25 SENSITIVE Sensitive     * ABUNDANT STAPHYLOCOCCUS AUREUS  Anaerobic culture     Status: None (Preliminary result)   Collection Time: 08/10/14  9:14 PM  Result Value Ref Range Status   Specimen Description FLUID LEFT AC JOINT  Final   Special Requests VANCO, ROCEPHIN  Final   Gram Stain   Final    FEW WBC PRESENT, PREDOMINANTLY PMN MODERATE GRAM POSITIVE COCCI IN PAIRS IN CLUSTERS Performed at [facility]    Culture   Final    NO ANAEROBES ISOLATED; CULTURE IN PROGRESS FOR 5 DAYS Performed at [facility]    Report Status PENDING  Incomplete  Culture, blood (routine x 2)     Status: None (Preliminary result)   Collection Time: 08/11/14  7:00 AM  Result Value Ref Range Status   Specimen Description BLOOD RIGHT ARM  Final   Special Requests BOTTLES DRAWN AEROBIC AND ANAEROBIC 6CC  Final   Culture   Final           BLOOD CULTURE RECEIVED NO GROWTH TO DATE CULTURE WILL BE HELD FOR 5 DAYS BEFORE ISSUING A FINAL NEGATIVE REPORT Performed at [facility]    Report Status PENDING  Incomplete  Culture, blood (routine x 2)     Status: None (Preliminary result)   Collection Time: 08/11/14  7:08 AM  Result Value Ref Range Status   Specimen Description BLOOD LEFT HAND  Final   Special Requests BOTTLES DRAWN AEROBIC AND ANAEROBIC [place]  Final   Culture   Final           BLOOD CULTURE RECEIVED NO GROWTH TO DATE CULTURE WILL BE HELD FOR 5 DAYS BEFORE ISSUING A FINAL NEGATIVE REPORT Performed at [facility]    Report Status PENDING  Incomplete  Culture, Urine     Status: None    Collection Time: 08/12/14  5:47 AM  Result Value Ref Range Status   Specimen Description URINE, RANDOM  Final  Special Requests NONE  Final   Colony Count   Final    3,000 COLONIES/ML Performed at [facility]    Culture   Final    INSIGNIFICANT GROWTH Performed at [facility]    Report Status 08/13/2014 FINAL  Final    RADIOLOGY STUDIES/RESULTS: Dg Chest 2 View  08/07/2014   CLINICAL DATA:  Left shoulder pain for 5 days. No known injury. Initial encounter.  EXAM: CHEST  2 VIEW  COMPARISON:  None.  FINDINGS: The heart size and mediastinal contours are within normal limits. Both lungs are clear. No pneumothorax or pleural effusion is noted. The visualized skeletal structures are unremarkable.  IMPRESSION: No acute cardiopulmonary abnormality seen.   Electronically Signed   By: Marijo Conception, M.D.   On: 08/07/2014 15:02   Ct Angio Chest Pe W/cm &/or Wo Cm  08/07/2014   CLINICAL DATA:  Tachycardia and hypoxia. Left shoulder pain for 4 days. Renal insufficiency.  EXAM: CT ANGIOGRAPHY CHEST WITH CONTRAST  TECHNIQUE: Multidetector CT imaging of the chest was performed using the standard protocol during bolus administration of intravenous contrast. Multiplanar CT image reconstructions and MIPs were obtained to evaluate the vascular anatomy.  CONTRAST:  163mL OMNIPAQUE IOHEXOL 350 MG/ML SOLN. The patient was pre-medicated with Benadryl and Solu-Medrol due to a history of hives with Omnipaque.  COMPARISON:  08/07/2014; 10/18/2011  FINDINGS: Mediastinum/Nodes: No filling defect is identified in the pulmonary arterial tree to suggest pulmonary embolus. Mild atherosclerotic calcification of the aortic arch without acute aortic abnormality identified. No pathologic thoracic adenopathy. Mild cardiomegaly.  Lungs/Pleura: Secondary pulmonary lobular interstitial accentuation noted particularly at the lung apices. Mild atelectasis dependently in both lower lobes. No airspace opacity  identified.  Upper abdomen: Unremarkable  Musculoskeletal: Mid thoracic spondylosis and mild kyphosis.  Review of the MIP images confirms the above findings.  IMPRESSION: 1. Mild cardiomegaly with secondary pulmonary lobular interstitial accentuation compatible with mild interstitial edema. 2. No embolus identified.   Electronically Signed   By: Van Clines M.D.   On: 08/07/2014 17:25   Mr Hand Right W Wo Contrast  08/11/2014   CLINICAL DATA:  RIGHT hand swelling. Septic arthritis. Previous aspirations and debridement for septic arthritis.  EXAM: MRI OF THE RIGHT HAND WITHOUT AND WITH CONTRAST  TECHNIQUE: Multiplanar, multisequence MR imaging was performed both before and after administration of intravenous contrast.  CONTRAST:  72mL MULTIHANCE GADOBENATE DIMEGLUMINE 529 MG/ML IV SOLN  COMPARISON:  None.  FINDINGS: Edema is present over the dorsum of the hand without abscess. There is flexor and extensor tenosynovitis in the long finger, which may be septic or reactive. The other flexor and extensor compartments appear normal. No deep soft tissue abscess. Periarticular edema is present at third MCP joint however there is no chondrolysis or subchondral marrow edema identified at the third MCP joint. Small third MCP joint effusion. Septic arthritis is considered unlikely based on the lack of secondary findings. The IP joints appear within normal limits. Small cyst or prominent vascular entry zone is present in the radial aspect of the third metacarpal head.  IMPRESSION: Long finger extensor and flexor tenosynovitis, concerning for septic tenosynovitis. Inflammatory changes at the third MCP joint are likely reactive rather than septic arthritis based on the lack of marrow signal changes and chondrolysis. Cellulitis of the hand.   Electronically Signed   By: Dereck Ligas M.D.   On: 08/11/2014 12:35   Mr Shoulder Left Wo Contrast  08/09/2014   CLINICAL DATA:  Acute onset intractable left shoulder pain  08/03/2014 after are a lifting injury.  EXAM: MRI OF THE LEFT SHOULDER WITHOUT CONTRAST  TECHNIQUE: Multiplanar, multisequence MR imaging of the shoulder was performed. No intravenous contrast was administered.  COMPARISON:  Plain films left shoulder 08/07/2014.  FINDINGS: The study is degraded by patient motion.  Rotator cuff: Supraspinatus worse than infraspinatus tendinopathy without tear is identified.  Muscles:  No atrophy or focal lesion.  Biceps long head:  Intact.  Acromioclavicular Joint: Moderate appearing acromioclavicular osteoarthritis is present. There is fluid within the joint. Intense soft tissue edema is seen about the joint and surrounding fatty and muscular tissues including the deltoid and superior aspect of the supraspinatus. No focal fluid collection is identified.  Labrum:  Intact.  Bones: As described above. No fracture is identified. The acromion is type 2. There is fluid in the subacromial/subdeltoid bursa.  IMPRESSION: Dominant finding is the abnormal appearance of the acromioclavicular joint highly suspicious for septic joint with surrounding cellulitis and myositis.  Moderate acromioclavicular osteoarthritis.  Mild supraspinatus and infraspinatus tendinopathy without tear.  These results will be called to the ordering clinician or representative by the Radiologist Assistant, and communication documented in the PACS or zVision Dashboard.   Electronically Signed   By: Inge Rise M.D.   On: 08/09/2014 08:48   Ir Fluoro Guide Ndl Plmt / Bx  08/10/2014   CLINICAL DATA:  59 year old female with left shoulder pain and evidence on recent MRI of septic arthritis of acromioclavicular joint. She has been referred for evaluation of aspiration.  EXAM: ULTRASOUND AND FLUOROSCOPIC GUIDED LEFT ACROMIOCLAVICULAR JOINT ASPIRATION.  TECHNIQUE: The procedure, risks, benefits, and alternatives were explained to the patient. Questions regarding the procedure were encouraged and answered. The patient  understands and consents to the procedure.  Fluoroscopic image of the left shoulder performed. Ultrasound survey of the shoulder performed. Image was stored and sent to PACs with both modality.  Using fluoroscopy, the skin and subcutaneous tissues overlying the left acromioclavicular joint were generously infiltrated 1% lidocaine for local anesthesia.  We then used ultrasound guidance to advance an 18 gauge needle attached to a syringe into the left acromioclavicular joint fluid. Two separate aspiration were performed. Approximately 1-2 cc of fluid were aspirated.  Samples were sent to the lab for analysis.  Patient tolerated the procedure well and remained hemodynamically stable throughout.  No complications were encountered and no significant blood loss was encountered.  COMPLICATIONS: None  IMPRESSION: Status post fluoroscopic and ultrasound-guided left AC joint aspiration with sample sent to the lab for analysis.  Signed,  Dulcy Fanny. Earleen Newport, DO  [facility]  [facility]   Electronically Signed   By: Corrie Mckusick D.O.   On: 08/10/2014 12:07   Dg Shoulder Left  08/07/2014   CLINICAL DATA:  59 year old female with left shoulder pain after have the lifting injury on 08/03/2014. Initial encounter.  EXAM: LEFT SHOULDER - 2+ VIEW  COMPARISON:  None.  FINDINGS: Bone mineralization is within normal limits for age. No glenohumeral joint dislocation. Proximal left humerus intact. Left clavicle and scapula appear intact. Negative visible left ribs and lung parenchyma.  IMPRESSION: No acute osseous abnormality identified at the left shoulder.   Electronically Signed   By: Genevie Ann M.D.   On: 08/07/2014 11:11    Oren Binet, MD  [facility] Pager:[phone]  If 7PM-7AM, please contact night-coverage [email or web address] Password [email or web address] 08/13/2014, 11:59 AM   LOS: 6 days

## 2014-08-13 NOTE — Evaluation (Signed)
Physical Therapy Evaluation Patient Details Name: Brandy Houston MRN: [id number] DOB: 16-Apr-1956 Today's Date: 08/13/2014   History of Present Illness  MSSA bacteremia with Polyarticular septic arthrits  s/p I&D of BUE's AC joint, BLE knee joint, and right hand  Clinical Impression  Patient did well sitting on EOB today.  Limited by pain in bilateral shoulders and knees.  Uncertain if there are any weight bearing restrictions for UE's and thus did not attempt OOB today.  Patient presenting with dependencies in mobility and will benefit from PT to increase mobility and independence.  Patient will need to be independent to return home and thus recommend CIR to reach max potential.    Follow Up Recommendations CIR    Equipment Recommendations   (tbd with increased mobility)    Recommendations for Other Services Rehab consult     Precautions / Restrictions Precautions Precaution Comments: need to clarify with MD if any ROM restrictions BUE and BLE Restrictions Weight Bearing Restrictions: Yes RLE Weight Bearing: Weight bearing as tolerated LLE Weight Bearing: Weight bearing as tolerated Other Position/Activity Restrictions: need to clarify with MD if any weight bearing restrictions on UE's      Mobility  Bed Mobility Overal bed mobility: Needs Assistance Bed Mobility: Supine to Sit;Sit to Supine     Supine to sit: Mod assist Sit to supine: Mod assist;+2 for physical assistance   General bed mobility comments: sat EOB x 15 minutes, did not attempt standing until able to clarify Weight bearing restrictions with MD for UE's  Transfers                    Ambulation/Gait                Stairs            Wheelchair Mobility    Modified Rankin (Stroke Patients Only)       Balance Overall balance assessment: Needs assistance Sitting-balance support: No upper extremity supported;Feet supported Sitting balance-Leahy Scale: Good                                        Pertinent Vitals/Pain Pain Assessment: 0-10 Pain Score: 6  Pain Location: shoulders Pain Descriptors / Indicators: Discomfort;Moaning Pain Intervention(s): Limited activity within patient's tolerance;Monitored during session;Repositioned    Home Living Family/patient expects to be discharged to:: Private residence Living Arrangements: Children Available Help at Discharge: Family;Available PRN/intermittently (will be alone during day) Type of Home: House Home Access: Stairs to enter Entrance Stairs-Rails: None Entrance Stairs-Number of Steps: 4 Home Layout: One level Home Equipment: None      Prior Function Level of Independence: Independent               Hand Dominance        Extremity/Trunk Assessment   Upper Extremity Assessment: RUE deficits/detail;LUE deficits/detail   RUE: Unable to fully assess due to pain       Lower Extremity Assessment: RLE deficits/detail;LLE deficits/detail RLE Deficits / Details: ankle movement WFL, able to initiate knee extension against gravity, limited by pain LLE Deficits / Details: ankle movement WFL, able to initiate knee extension against gravity, limited by pain     Communication   Communication: No difficulties  Cognition Arousal/Alertness: Awake/alert Behavior During Therapy: WFL for tasks assessed/performed Overall Cognitive Status: Within Functional Limits for tasks assessed  General Comments      Exercises General Exercises - Lower Extremity Ankle Circles/Pumps: AROM;Both;Strengthening;Seated      Assessment/Plan    PT Assessment Patient needs continued PT services  PT Diagnosis Difficulty walking;Acute pain   PT Problem List Decreased strength;Decreased range of motion;Decreased activity tolerance;Decreased balance;Decreased mobility;Decreased knowledge of use of DME;Decreased knowledge of precautions;Pain  PT Treatment Interventions DME  instruction;Gait training;Functional mobility training;Therapeutic activities;Therapeutic exercise;Balance training;Patient/family education   PT Goals (Current goals can be found in the Care Plan section) Acute Rehab PT Goals Patient Stated Goal: go home, feel better PT Goal Formulation: With patient Time For Goal Achievement: 08/27/14 Potential to Achieve Goals: Good    Frequency Min 4X/week   Barriers to discharge Decreased caregiver support      Co-evaluation               End of Session   Activity Tolerance: Patient tolerated treatment well Patient left: in bed;with call bell/phone within reach           Time: 1455-1539 PT Time Calculation (min) (ACUTE ONLY): 44 min   Charges:   PT Evaluation $Initial PT Evaluation Tier I: 1 Procedure PT Treatments $Therapeutic Activity: 8-22 mins   PT G CodesShanna Cisco 08/13/2014, 3:48 PM  08/13/2014 [name], [name]

## 2014-08-14 ENCOUNTER — Encounter (HOSPITAL_COMMUNITY)
Admission: EM | Disposition: A | Discharge status: Rehabilitation Facility | Payer: Self-pay | Source: Home / Self Care | Attending: Internal Medicine

## 2014-08-14 ENCOUNTER — Encounter (HOSPITAL_COMMUNITY): Payer: Self-pay | Admitting: Orthopaedic Surgery

## 2014-08-14 DIAGNOSIS — I340 Nonrheumatic mitral (valve) insufficiency: Secondary | ICD-10-CM

## 2014-08-14 DIAGNOSIS — M0029 Other streptococcal polyarthritis: Secondary | ICD-10-CM

## 2014-08-14 HISTORY — PX: TEE WITHOUT CARDIOVERSION: SHX5443

## 2014-08-14 LAB — BODY FLUID CULTURE: Special Requests: NORMAL

## 2014-08-14 LAB — GLUCOSE, CAPILLARY
Glucose-Capillary: 122 mg/dL — ABNORMAL HIGH (ref 70–99)
Glucose-Capillary: 178 mg/dL — ABNORMAL HIGH (ref 70–99)
Glucose-Capillary: 222 mg/dL — ABNORMAL HIGH (ref 70–99)
Glucose-Capillary: 212 mg/dL — ABNORMAL HIGH (ref 70–99)

## 2014-08-14 LAB — CBC
HCT: 27.1 % — ABNORMAL LOW (ref 36.0–46.0)
Hemoglobin: 9 g/dL — ABNORMAL LOW (ref 12.0–15.0)
MCH: 30.6 pg (ref 26.0–34.0)
MCHC: 33.2 g/dL (ref 30.0–36.0)
MCV: 92.2 fL (ref 78.0–100.0)
Platelets: 348 10*3/uL (ref 150–400)
RBC: 2.94 MIL/uL — ABNORMAL LOW (ref 3.87–5.11)
RDW: 13.2 % (ref 11.5–15.5)
WBC: 19.7 10*3/uL — ABNORMAL HIGH (ref 4.0–10.5)

## 2014-08-14 LAB — BASIC METABOLIC PANEL
Anion gap: 10 (ref 5–15)
BUN: 31 mg/dL — ABNORMAL HIGH (ref 6–23)
CO2: 26 mmol/L (ref 19–32)
Calcium: 8 mg/dL — ABNORMAL LOW (ref 8.4–10.5)
Chloride: 96 mmol/L (ref 96–112)
Creatinine, Ser: 1.69 mg/dL — ABNORMAL HIGH (ref 0.50–1.10)
GFR calc Af Amer: 37 mL/min — ABNORMAL LOW (ref >90)
GFR calc non Af Amer: 32 mL/min — ABNORMAL LOW (ref >90)
Glucose, Bld: 178 mg/dL — ABNORMAL HIGH (ref 70–99)
Potassium: 3.8 mmol/L (ref 3.5–5.1)
Sodium: 132 mmol/L — ABNORMAL LOW (ref 135–145)

## 2014-08-14 SURGERY — ECHOCARDIOGRAM, TRANSESOPHAGEAL
Anesthesia: Moderate Sedation

## 2014-08-14 MED ORDER — FENTANYL CITRATE 0.05 MG/ML IJ SOLN
INTRAMUSCULAR | Status: AC
  Filled 2014-08-14: qty 2

## 2014-08-14 MED ORDER — SODIUM CHLORIDE 0.9 % IV SOLN: INTRAVENOUS | Status: DC

## 2014-08-14 MED ORDER — BUTAMBEN-TETRACAINE-BENZOCAINE 2-2-14 % EX AERO
INHALATION_SPRAY | CUTANEOUS | Status: DC | PRN
  Administered 2014-08-14: 2 via TOPICAL

## 2014-08-14 MED ORDER — DIPHENHYDRAMINE HCL 50 MG/ML IJ SOLN
INTRAMUSCULAR | Status: AC
  Filled 2014-08-14: qty 1

## 2014-08-14 MED ORDER — CEFAZOLIN SODIUM-DEXTROSE 2-3 GM-% IV SOLR
2.0000 g | Freq: Three times a day (TID) | INTRAVENOUS | Status: DC
  Administered 2014-08-14 – 2014-08-15 (×3): 2 g via INTRAVENOUS
  Filled 2014-08-14 (×5): qty 50

## 2014-08-14 MED ORDER — MIDAZOLAM HCL 10 MG/2ML IJ SOLN
INTRAMUSCULAR | Status: DC | PRN
  Administered 2014-08-14: 2 mg via INTRAVENOUS
  Administered 2014-08-14: 1 mg via INTRAVENOUS
  Administered 2014-08-14: 2 mg via INTRAVENOUS

## 2014-08-14 MED ORDER — MIDAZOLAM HCL 5 MG/ML IJ SOLN
INTRAMUSCULAR | Status: AC
  Filled 2014-08-14: qty 2

## 2014-08-14 MED ORDER — FENTANYL CITRATE 0.05 MG/ML IJ SOLN
INTRAMUSCULAR | Status: DC | PRN
  Administered 2014-08-14 (×3): 25 ug via INTRAVENOUS

## 2014-08-14 NOTE — Progress Notes (Signed)
Rehab admissions - Evaluated for possible admission.  I met with patient.  She lives with her 60 yo son who attends college but does not work.  Patient was working PTA, but has no insurance.  She would like to come to inpatient rehab tomorrow.  Will see how patient does with therapies and then follow up tomorrow.  Call me for questions.  #[phone]

## 2014-08-14 NOTE — Progress Notes (Signed)
Rehab Admissions Coordinator Note:  Patient was screened by Retta Diones for appropriateness for an Inpatient Acute Rehab Consult.  At this time, an inpatient rehab consult has been ordered and is pending completion.  I will follow up once consult is completed.  Jodell Cipro M 08/14/2014, 8:26 AM  I can be reached at [phone].

## 2014-08-14 NOTE — PMR Pre-admission (Signed)
PMR Admission Coordinator Pre-Admission Assessment  Patient: Brandy Houston is an 59 y.o., female MRN: [id number] DOB: Dec 27, 1955 Height: 5\' 7"  (170.2 cm) Weight: 92.2 kg (203 lb 4.2 oz)              Insurance Information Self pay - no insurance.  Has an orange card for health care needs.  Medicaid Application Date:        Case Manager:   Disability Application Date:        Case Worker:    Emergency Contact Information Contact Information    Name Relation Home Work Mobile   [place] Son [phone]     Geeting,[name] Daughter   [phone]     Current Medical History  Patient Admitting Diagnosis: Septic Joints and debility    History of Present Illness:  A 59yo AAF with PMH sig for poorly controlled DM, HTN, CKD (baseline Scr 1.3-1.6) and new diagnosis of Hep C infection who was admitted to [facility] 08/07/14 with left shoulder pain. She had an elevated D-Dimer and underwent CT-angio to r/o PE on 08/07/14 and was admitted for further evaluation. work up revealed MSSA bacteremia with septic arthritis of bilateral AC joints, bilateral knees and right hand flexor tendon sheath. She underwent I and D bilateral knees and I and D with drainage of abscess of bilateral shoulder by Dr. [name] on 08/10/14. She has required repeat I and D left knee on 02/26 and 03/05. Dr. Kathrynn Speed consulted for right hand and forearm infection and patient underwent I and D of right flexor tendon sheath and right volar forearm on 02/27. 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 Kidney function has not improved with treatment of infection and patient has been dialysis dependent since 08/20/14. AVG placed by Dr. Kellie Simmering on 03/30.  Patient now on chronic HD and has been clipped.  Once discharged, she will go to [facility] [address].  Past Medical  History  Past Medical History  Diagnosis Date  . Diabetes mellitus   . Hypertension   . Peripheral neuropathy     Family History  family history includes Cancer in her brother, mother, and sister; Diabetes in her brother; Heart disease in her father.  Prior Rehab/Hospitalizations:  None   Current Medications   Current facility-administered medications:  .  0.9 %  sodium chloride infusion, , Intravenous, Continuous, Hosie Poisson, MD, Last Rate: 10 mL/hr at 09/10/14 1813 .  acetaminophen (TYLENOL) tablet 650 mg, 650 mg, Oral, Q6H PRN, Gardiner Barefoot, NP, 650 mg at 08/31/14 0839 .  antiseptic oral rinse (CPC / CETYLPYRIDINIUM CHLORIDE 0.05%) solution 7 mL, 7 mL, Mouth Rinse, q12n4p, Costin Karlyne Greenspan, MD, 7 mL at 09/13/14 1800 .  aspirin chewable tablet 81 mg, 81 mg, Oral, Daily, Lily Kocher, MD, 81 mg at 09/12/14 1000 .  ceFAZolin (ANCEF) IVPB 1 g/50 mL premix, 1 g, Intravenous, Q24H, Carly M Sabat, RPH, 1 g at 09/13/14 1800 .  [START ON 09/15/2014] ceFAZolin (ANCEF) IVPB 2 g/50 mL premix, 2 g, Intravenous, Q M,W,F-HD, Osborne Oman, RPH .  chlorhexidine (PERIDEX) 0.12 % solution 15 mL, 15 mL, Mouth Rinse, BID, Caren Griffins, MD, 15 mL at 09/13/14 2235 .  cyclobenzaprine (FLEXERIL) tablet 5 mg, 5 mg, Oral, BID, Lily Kocher, MD, 5 mg at 09/13/14 2235 .  [START ON 09/19/2014] Darbepoetin Alfa (  ARANESP) injection 200 mcg, 200 mcg, Intravenous, Q Tue-HD, Jamal Maes, MD .  diphenhydrAMINE (BENADRYL) capsule 25 mg, 25 mg, Oral, Q6H PRN, Ritta Slot, NP, 25 mg at 09/11/14 0806 .  famotidine (PEPCID) tablet 20 mg, 20 mg, Oral, Daily, Jonetta Osgood, MD, 20 mg at 09/13/14 1557 .  [START ON 09/15/2014] ferric gluconate (NULECIT) 125 mg in sodium chloride 0.9 % 100 mL IVPB, 125 mg, Intravenous, Q M,W,F-HD, Jamal Maes, MD .  gi cocktail (Maalox,Lidocaine,Donnatal), 30 mL, Oral, TID PRN, Donne Hazel, MD, 30 mL at 09/11/14 0529 .  guaiFENesin-dextromethorphan (ROBITUSSIN DM) 100-10  MG/5ML syrup 5 mL, 5 mL, Oral, Q4H PRN, Gardiner Barefoot, NP, 5 mL at 08/25/14 0604 .  heparin injection 5,000 Units, 5,000 Units, Subcutaneous, 3 times per day, Gabriel Earing, PA-C, 5,000 Units at 09/13/14 2235 .  hydrALAZINE (APRESOLINE) injection 10 mg, 10 mg, Intravenous, Q4H PRN, Donne Hazel, MD .  hydrALAZINE (APRESOLINE) tablet 25 mg, 25 mg, Oral, BID, Jamal Maes, MD, 25 mg at 09/13/14 2235 .  HYDROcodone-acetaminophen (NORCO/VICODIN) 5-325 MG per tablet 1-2 tablet, 1-2 tablet, Oral, Q4H PRN, Lily Kocher, MD, 2 tablet at 09/13/14 1556 .  HYDROmorphone (DILAUDID) 1 MG/ML injection, , , ,  .  HYDROmorphone (DILAUDID) injection 1 mg, 1 mg, Intravenous, Q4H PRN, Donne Hazel, MD, 1 mg at 09/14/14 1207 .  insulin aspart (novoLOG) injection 0-9 Units, 0-9 Units, Subcutaneous, TID WC, Lily Kocher, MD, 1 Units at 09/12/14 1751 .  lanthanum (FOSRENOL) chewable tablet 1,000 mg, 1,000 mg, Oral, TID WC, Mauricia Area, MD, 1,000 mg at 09/13/14 1801 .  magic mouthwash w/lidocaine, 5 mL, Oral, TID PRN, Donne Hazel, MD, 5 mL at 08/17/14 0217 .  metoprolol (LOPRESSOR) tablet 50 mg, 50 mg, Oral, BID, Jamal Maes, MD, 50 mg at 09/13/14 2235 .  multivitamin (RENA-VIT) tablet 1 tablet, 1 tablet, Oral, QHS, Jamal Maes, MD, 1 tablet at 09/13/14 0100 .  nicotine (NICODERM CQ - dosed in mg/24 hours) patch 21 mg, 21 mg, Transdermal, Daily, Caren Griffins, MD, 21 mg at 09/12/14 1120 .  ondansetron [facility]) injection 4 mg, 4 mg, Intravenous, Q6H PRN, Donne Hazel, MD, 4 mg at 09/12/14 2127 .  polyethylene glycol (MIRALAX / GLYCOLAX) packet 17 g, 17 g, Oral, BID, Caren Griffins, MD, 17 g at 09/13/14 1559 .  sodium chloride (OCEAN) 0.65 % nasal spray 1 spray, 1 spray, Each Nare, PRN, Gardiner Barefoot, NP, 1 spray at 09/05/14 1035 .  sodium chloride 0.9 % injection 10-40 mL, 10-40 mL, Intracatheter, Q12H, Donne Hazel, MD, 10 mL at 09/13/14 2243 .  sodium chloride 0.9 % injection  10-40 mL, 10-40 mL, Intracatheter, Q12H, Thurnell Lose, MD, 10 mL at 09/13/14 2243 .  sodium chloride 0.9 % injection 10-40 mL, 10-40 mL, Intracatheter, PRN, Thurnell Lose, MD, 10 mL at 09/13/14 2142 .  sodium chloride 0.9 % injection 3 mL, 3 mL, Intravenous, Q12H, Lily Kocher, MD, 3 mL at 09/12/14 2121 .  sorbitol 70 % solution 30 mL, 30 mL, Oral, Daily PRN, Donne Hazel, MD .  zolpidem [facility]) tablet 5 mg, 5 mg, Oral, QHS PRN, Thurnell Lose, MD, 5 mg at 09/11/14 2100  Patients Current Diet: Diet renal/carb modified with fluid restriction Diet-HS Snack?: Nothing; Room service appropriate?: Yes; Fluid consistency:: Thin  Precautions / Restrictions Precautions Precautions: Fall Precaution Comments: Will sit without warning when fatigued; bring chair behind for safety Restrictions Weight Bearing Restrictions: No  RLE Weight Bearing: Weight bearing as tolerated LLE Weight Bearing: Weight bearing as tolerated Other Position/Activity Restrictions: As no clarification given after asking from WB clarification, assuming WBAT on bil UE due to MD wanting active movement all joints   Prior Activity Level Community (5-7x/wk): Went out daily.  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  Home Assistive Devices / Equipment Home Assistive Devices/Equipment: CBG Meter Home Equipment: None  Prior Functional Level Prior Function Level of Independence: Independent  Current Functional Level Cognition  Overall Cognitive Status: Within Functional Limits for tasks assessed Orientation Level: Oriented X4    Extremity Assessment (includes Sensation/Coordination)  Upper Extremity Assessment: RUE deficits/detail, LUE deficits/detail RUE: Unable to fully assess due to pain LUE: Unable to fully assess due to pain  Lower Extremity Assessment: RLE deficits/detail, LLE deficits/detail RLE Deficits / Details: ankle movement WFL, able to initiate knee extension against gravity,  limited by pain RLE: Unable to fully assess due to pain LLE Deficits / Details: ankle movement WFL, able to initiate knee extension against gravity, limited by pain LLE: Unable to fully assess due to pain    ADLs  Anticipate ADL deficits and the need for OT interventions    Mobility  Overal bed mobility: Modified Independent Bed Mobility: Supine to Sit Sidelying to sit: Min assist Supine to sit: Modified independent (Device/Increase time) Sit to supine: Mod assist, +2 for physical assistance General bed mobility comments: mod I from supine to sit at EOB with use of bedrail to assist self    Transfers  Overall transfer level: Needs assistance Equipment used: Rolling walker (2 wheeled) Transfers: Sit to/from Stand Sit to Stand: Min assist, +2 physical assistance, Mod assist Stand pivot transfers: Min assist, +2 safety/equipment General transfer comment: Pt. needed +2 min assist to rise to stand from bed; needed mod assist for controlled descent to recliner as pt. tends to sit with little control; noted pt is quite dependent on momentum for successful sit to stand, indicative of weakness    Ambulation / Gait / Stairs / Wheelchair Mobility  Ambulation/Gait Ambulation/Gait assistance: Museum/gallery curator (Feet): 75 Feet (one seated rest break) Assistive device: Rolling walker (2 wheeled) Gait Pattern/deviations: Step-to pattern, Trunk flexed, Narrow base of support, Ataxic Gait velocity: Decreased Gait velocity interpretation: Below normal speed for age/gender General Gait Details: vc's for posture' min assist for safety and stability due to her unsteadiness and second person closely following with recliner chair    Posture / Balance Dynamic Sitting Balance Sitting balance - Comments: Completed 5 reps each of LAQ's, full extension on R LE and 20* lag on the L LE Balance Overall balance assessment: Needs assistance Sitting-balance support: No upper extremity supported, Feet  supported Sitting balance-Leahy Scale: Good Sitting balance - Comments: Completed 5 reps each of LAQ's, full extension on R LE and 20* lag on the L LE Standing balance support: Bilateral upper extremity supported Standing balance-Leahy Scale: Poor Standing balance comment: Stood in RW x>47min working to accept weight into LE's prior to starting the transfer    Special needs/care consideration BiPAP/CPAP No CPM No Continuous Drip IV KVO Dialysis Yes, T-Th-Sat, new HD    Life Vest: No Oxygen No Special Bed No Trach Size No Wound Vac (area) No    Skin:  Has bilateral shoulder incisions  ankles and right hand wounds.                  Bowel mgmt: Last BM 09/14/14 Bladder  mgmt: Patient says she is not making urine at this time. Diabetic mgmt Yes, on oral medications at home.    Previous Home Environment Living Arrangements: Spouse/significant other Available Help at Discharge: Family, Available PRN/intermittently (will be alone during day) Type of Home: House Home Layout: One level Home Access: Stairs to enter Entrance Stairs-Rails: None Entrance Stairs-Number of Steps: 4 Home Care Services: No  Discharge Living Setting Plans for Discharge Living Setting: House, Lives with (comment) (Lives with 67 yo son.) Type of Home at Discharge: House Discharge Home Layout: Two level, Able to live on main level with bedroom/bathroom Alternate Level Stairs-Number of Steps: Flight Discharge Home Access: Stairs to enter Technical brewer of Steps: 3-4 steps Does the patient have any problems obtaining your medications?: No  Social/Family/Support Systems Patient Roles: Parent (Has a 47 yo son.) Contact Information: Rishita Adamek - son [phone] Anticipated Caregiver: self and son Ability/Limitations of Caregiver: Son goes to school mornings and then comes home 10 am or so. Caregiver Availability: Intermittent Discharge Plan Discussed with Primary Caregiver: Yes Is Caregiver In Agreement  with Plan?: Yes Does Caregiver/Family have Issues with Lodging/Transportation while Pt is in Rehab?: No  Goals/Additional Needs Patient/Family Goal for Rehab: PT/OT supervision to mod I goals Cultural Considerations: None Dietary Needs: Renal, carb mod, fluid restricted, thin liquids Equipment Needs: TBD Pt/Family Agrees to Admission and willing to participate: Yes Program Orientation Provided & Reviewed with Pt/Caregiver Including Roles  & Responsibilities: Yes  Decrease burden of Care through IP rehab admission: N/A  Possible need for SNF placement upon discharge: Not anticipated  Patient Condition: This patient's medical and functional status has changed since the consult dated: 08/14/14 in which the Rehabilitation Physician determined and documented that the patient's condition is appropriate for intensive rehabilitative care in an inpatient rehabilitation facility. See "History of Present Illness" (above) for medical update. Functional changes are: Currently requiring min assist for transfers and min assist to ambulate 75 ft RW. Patient's medical and functional status update has been discussed with the Rehabilitation physician and patient remains appropriate for inpatient rehabilitation. Will admit to inpatient rehab today.  Preadmission Screen Completed By:  Retta Diones, 09/14/2014 1:37 PM ______________________________________________________________________   Discussed status with Dr. Naaman Plummer on 09/14/14 at 1337 and received telephone approval for admission today.  Admission Coordinator:  Retta Diones, time1337/Date03/31/16

## 2014-08-14 NOTE — Progress Notes (Signed)
Physical Therapy Treatment Patient Details Name: Brandy Houston MRN: [id number] DOB: January 23, 1956 Today's Date: 08/14/2014    History of Present Illness MSSA bacteremia with Polyarticular septic arthrits  s/p I&D of BUE's AC joint, BLE knee joint, and right hand    PT Comments    Had much difficulty powering up from a low recliner due to pain and limited knee ROM bilaterally.  Follow Up Recommendations  CIR     Equipment Recommendations  None recommended by PT    Recommendations for Other Services Rehab consult     Precautions / Restrictions Precautions Precautions: Fall Restrictions RLE Weight Bearing: Weight bearing as tolerated LLE Weight Bearing: Weight bearing as tolerated Other Position/Activity Restrictions: need to clarify with MD if any weight bearing restrictions on UE's    Mobility  Bed Mobility Overal bed mobility: Needs Assistance Bed Mobility: Sit to Supine     Supine to sit: Mod assist Sit to supine: Mod assist;+2 for physical assistance   General bed mobility comments: pt needed 2 person assist to reposition, but did a fair job or bridging with R LE  Transfers Overall transfer level: Needs assistance   Transfers: Sit to/from [employer] Transfers Sit to Stand: Mod assist;From elevated surface Stand pivot transfers: Mod assist       General transfer comment: Pivot with RW and small painful steps.  Ambulation/Gait             General Gait Details: see transfer only   Stairs            Wheelchair Mobility    Modified Rankin (Stroke Patients Only)       Balance Overall balance assessment: Needs assistance Sitting-balance support: No upper extremity supported Sitting balance-Leahy Scale: Good Sitting balance - Comments: weight shifts and scoots as well as accepts challenge.   Standing balance support: Bilateral upper extremity supported Standing balance-Leahy Scale: Poor                      Cognition  Arousal/Alertness: Awake/alert Behavior During Therapy: WFL for tasks assessed/performed Overall Cognitive Status: Within Functional Limits for tasks assessed                      Exercises General Exercises - Lower Extremity Ankle Circles/Pumps: AROM;Both;10 reps;Supine Heel Slides: Both;10 reps;Supine;AAROM Other Exercises Other Exercises: bicep/tricep press/ shoulder ROM x10 reps.--graded assist    General Comments        Pertinent Vitals/Pain Pain Assessment: Faces Faces Pain Scale: Hurts whole lot Pain Location: knees Pain Descriptors / Indicators: Sharp;Sore Pain Intervention(s): Limited activity within patient's tolerance;Repositioned    Home Living                      Prior Function            PT Goals (current goals can now be found in the care plan section) Acute Rehab PT Goals Patient Stated Goal: go home, feel better PT Goal Formulation: With patient Time For Goal Achievement: 08/27/14 Potential to Achieve Goals: Good Progress towards PT goals: Progressing toward goals    Frequency  Min 4X/week    PT Plan Current plan remains appropriate    Co-evaluation             End of Session Equipment Utilized During Treatment: Gait belt Activity Tolerance: Patient tolerated treatment well Patient left: with call bell/phone within reach;in bed;with nursing/sitter in room     Time: 1720-1734 PT Time  Calculation (min) (ACUTE ONLY): 14 min  Charges:  $Therapeutic Exercise: 8-22 mins $Therapeutic Activity: 8-22 mins                    G Codes:      Jared Cahn, [name] 08/14/2014, 5:40 PM 08/14/2014  Donnella Sham, PT [phone]  (pager)

## 2014-08-14 NOTE — Progress Notes (Signed)
ANTIBIOTIC CONSULT NOTE - INITIAL  Pharmacy Consult for Ancef Indication: bacteremia  Allergies  Allergen Reactions  . Compazine [Prochlorperazine] Shortness Of Breath and Swelling    TONGUE SWELLS  . Omnipaque [Iohexol] Hives  . Shellfish-Derived Products Anaphylaxis  . Iodinated Diagnostic Agents Rash    Patient Measurements: Height: 5' 7.5" (171.5 cm) Weight: 198 lb 14.4 oz (90.22 kg) IBW/kg (Calculated) : 62.75 Vital Signs: Temp: 100.3 F (37.9 C) (02/29 0606) Temp Source: Oral (02/29 0606) BP: 161/72 mmHg (02/29 0606) Pulse Rate: 110 (02/29 0606) Intake/Output from previous day: 02/28 0701 - 02/29 0700 In: 794.3 [I.V.:644.3; IV Piggyback:150] Out: 350 [Urine:350] Intake/Output from this shift: Total I/O In: -  Out: 200 [Urine:200]  Labs:  Recent Labs  08/12/14 0522 08/13/14 0809 08/14/14 0651  WBC 18.2* 19.1* 19.7*  HGB 9.6* 9.9* 9.0*  PLT 285 308 348  CREATININE 1.42* 1.35* 1.69*   Estimated Creatinine Clearance: 41.8 mL/min (by C-G formula based on Cr of 1.69).  Recent Labs  08/12/14 0530  VANCOTROUGH 23.6*     Microbiology: Recent Results (from the past 720 hour(s))  Culture, blood (routine x 2)     Status: None   Collection Time: 08/09/14 12:15 PM  Result Value Ref Range Status   Specimen Description BLOOD RIGHT HAND  Final   Special Requests   Final    BOTTLES DRAWN AEROBIC AND ANAEROBIC 7CCS BOTH BOTTLES   Culture   Final    STAPHYLOCOCCUS AUREUS Note: RIFAMPIN AND GENTAMICIN SHOULD NOT BE USED AS SINGLE DRUGS FOR TREATMENT OF STAPH INFECTIONS. Note: Gram Stain Report Called to,Read Back By and Verified With: Shirlean Kelly 700AM 08/10/14 [place] Performed at [facility]    Report Status 08/12/2014 FINAL  Final   Organism ID, Bacteria STAPHYLOCOCCUS AUREUS  Final      Susceptibility   Staphylococcus aureus - MIC*    CLINDAMYCIN <=0.25 SENSITIVE Sensitive     ERYTHROMYCIN <=0.25 SENSITIVE Sensitive     GENTAMICIN <=0.5  SENSITIVE Sensitive     LEVOFLOXACIN 0.25 SENSITIVE Sensitive     OXACILLIN <=0.25 SENSITIVE Sensitive     PENICILLIN >=0.5 RESISTANT Resistant     RIFAMPIN <=0.5 SENSITIVE Sensitive     TRIMETH/SULFA <=10 SENSITIVE Sensitive     VANCOMYCIN 1 SENSITIVE Sensitive     TETRACYCLINE <=1 SENSITIVE Sensitive     MOXIFLOXACIN <=0.25 SENSITIVE Sensitive     * STAPHYLOCOCCUS AUREUS  Culture, blood (routine x 2)     Status: None   Collection Time: 08/09/14 12:25 PM  Result Value Ref Range Status   Specimen Description BLOOD RIGHT ARM  Final   Special Requests   Final    BOTTLES DRAWN AEROBIC AND ANAEROBIC 10CC BOTH BOTTLES   Culture   Final    STAPHYLOCOCCUS AUREUS Note: SUSCEPTIBILITIES PERFORMED ON PREVIOUS CULTURE WITHIN THE LAST 5 DAYS. Note: Gram Stain Report Called to,Read Back By and Verified With: Shirlean Kelly 700AM 08/10/14 [place] Performed at [facility]    Report Status 08/12/2014 FINAL  Final  Culture, routine-abscess     Status: None   Collection Time: 08/10/14 11:54 AM  Result Value Ref Range Status   Specimen Description SHOULDER LEFT  Final   Special Requests NONE  Final   Gram Stain   Final    ABUNDANT WBC PRESENT, PREDOMINANTLY PMN NO SQUAMOUS EPITHELIAL CELLS SEEN MODERATE GRAM POSITIVE COCCI IN PAIRS IN CLUSTERS Performed at [facility]    Culture   Final  MODERATE STAPHYLOCOCCUS AUREUS Note: RIFAMPIN AND GENTAMICIN SHOULD NOT BE USED AS SINGLE DRUGS FOR TREATMENT OF STAPH INFECTIONS. Performed at [facility]    Report Status 08/13/2014 FINAL  Final   Organism ID, Bacteria STAPHYLOCOCCUS AUREUS  Final      Susceptibility   Staphylococcus aureus - MIC*    CLINDAMYCIN <=0.25 SENSITIVE Sensitive     ERYTHROMYCIN <=0.25 SENSITIVE Sensitive     GENTAMICIN <=0.5 SENSITIVE Sensitive     LEVOFLOXACIN 0.25 SENSITIVE Sensitive     OXACILLIN 0.5 SENSITIVE Sensitive     PENICILLIN >=0.5 RESISTANT Resistant     RIFAMPIN <=0.5 SENSITIVE  Sensitive     TRIMETH/SULFA <=10 SENSITIVE Sensitive     VANCOMYCIN 1 SENSITIVE Sensitive     TETRACYCLINE <=1 SENSITIVE Sensitive     MOXIFLOXACIN <=0.25 SENSITIVE Sensitive     * MODERATE STAPHYLOCOCCUS AUREUS  Anaerobic culture     Status: None (Preliminary result)   Collection Time: 08/10/14 11:54 AM  Result Value Ref Range Status   Specimen Description SHOULDER LEFT  Final   Special Requests NONE  Final   Gram Stain   Final    MODERATE WBC PRESENT, PREDOMINANTLY PMN NO SQUAMOUS EPITHELIAL CELLS SEEN MODERATE GRAM POSITIVE COCCI IN PAIRS IN CLUSTERS Performed at [facility]    Culture   Final    No Anaerobes Isolated; Culture in Progress for 14 DAYS Performed at [facility]    Report Status PENDING  Incomplete  Body fluid culture     Status: None   Collection Time: 08/10/14  5:22 PM  Result Value Ref Range Status   Specimen Description SYNOVIAL JOINT RIGHT KNEE  Final   Special Requests Normal  Final   Gram Stain   Final    ABUNDANT WBC PRESENT,BOTH PMN AND MONONUCLEAR NO ORGANISMS SEEN Performed at [facility]    Culture   Final    FEW STAPHYLOCOCCUS AUREUS Note: RIFAMPIN AND GENTAMICIN SHOULD NOT BE USED AS SINGLE DRUGS FOR TREATMENT OF STAPH INFECTIONS. CRITICAL RESULT CALLED TO, READ BACK BY AND VERIFIED WITH: CINDY FLORES RN 08/13/14 AT 107 AM BY [facility] Performed at [facility]    Report Status 08/14/2014 FINAL  Final   Organism ID, Bacteria STAPHYLOCOCCUS AUREUS  Final      Susceptibility   Staphylococcus aureus - MIC*    CLINDAMYCIN <=0.25 SENSITIVE Sensitive     ERYTHROMYCIN <=0.25 SENSITIVE Sensitive     GENTAMICIN <=0.5 SENSITIVE Sensitive     LEVOFLOXACIN 0.25 SENSITIVE Sensitive     OXACILLIN 0.5 SENSITIVE Sensitive     PENICILLIN >=0.5 RESISTANT Resistant     RIFAMPIN <=0.5 SENSITIVE Sensitive     TRIMETH/SULFA <=10 SENSITIVE Sensitive     VANCOMYCIN <=0.5 SENSITIVE Sensitive     TETRACYCLINE <=1 SENSITIVE  Sensitive     MOXIFLOXACIN <=0.25 SENSITIVE Sensitive     * FEW STAPHYLOCOCCUS AUREUS  Surgical pcr screen     Status: Abnormal   Collection Time: 08/10/14  6:42 PM  Result Value Ref Range Status   MRSA, PCR NEGATIVE NEGATIVE Final   Staphylococcus aureus POSITIVE (A) NEGATIVE Final    Comment:        The Xpert SA Assay (FDA approved for NASAL specimens in patients over 8 years of age), is one component of a comprehensive surveillance program.  Test performance has been validated by [facility] for patients greater than or equal to 75 year old. It is not intended to diagnose infection nor to  guide or monitor treatment.   Body fluid culture     Status: None   Collection Time: 08/10/14  9:14 PM  Result Value Ref Range Status   Specimen Description FLUID LEFT AC JOINT  Final   Special Requests VANCO, ROCEPHIN  Final   Gram Stain   Final    FEW WBC PRESENT, PREDOMINANTLY PMN MODERATE GRAM POSITIVE COCCI IN PAIRS IN CLUSTERS Gram Stain Report Called to,Read Back By and Verified With: Gram Stain Report Called to,Read Back By and Verified With: Laurance Flatten RN 08/11/13 9:10AM BY [place] Performed at [facility]    Culture   Final    ABUNDANT STAPHYLOCOCCUS AUREUS Note: RIFAMPIN AND GENTAMICIN SHOULD NOT BE USED AS SINGLE DRUGS FOR TREATMENT OF STAPH INFECTIONS. Performed at [facility]    Report Status 08/13/2014 FINAL  Final   Organism ID, Bacteria STAPHYLOCOCCUS AUREUS  Final      Susceptibility   Staphylococcus aureus - MIC*    CLINDAMYCIN <=0.25 SENSITIVE Sensitive     ERYTHROMYCIN <=0.25 SENSITIVE Sensitive     GENTAMICIN <=0.5 SENSITIVE Sensitive     LEVOFLOXACIN 0.25 SENSITIVE Sensitive     OXACILLIN <=0.25 SENSITIVE Sensitive     PENICILLIN >=0.5 RESISTANT Resistant     RIFAMPIN <=0.5 SENSITIVE Sensitive     TRIMETH/SULFA <=10 SENSITIVE Sensitive     VANCOMYCIN 1 SENSITIVE Sensitive     TETRACYCLINE <=1 SENSITIVE Sensitive     MOXIFLOXACIN <=0.25  SENSITIVE Sensitive     * ABUNDANT STAPHYLOCOCCUS AUREUS  Anaerobic culture     Status: None (Preliminary result)   Collection Time: 08/10/14  9:14 PM  Result Value Ref Range Status   Specimen Description FLUID LEFT AC JOINT  Final   Special Requests VANCO, ROCEPHIN  Final   Gram Stain   Final    FEW WBC PRESENT, PREDOMINANTLY PMN MODERATE GRAM POSITIVE COCCI IN PAIRS IN CLUSTERS Performed at [facility]    Culture   Final    NO ANAEROBES ISOLATED; CULTURE IN PROGRESS FOR 5 DAYS Performed at [facility]    Report Status PENDING  Incomplete  Culture, blood (routine x 2)     Status: None (Preliminary result)   Collection Time: 08/11/14  7:00 AM  Result Value Ref Range Status   Specimen Description BLOOD RIGHT ARM  Final   Special Requests BOTTLES DRAWN AEROBIC AND ANAEROBIC 6CC  Final   Culture   Final           BLOOD CULTURE RECEIVED NO GROWTH TO DATE CULTURE WILL BE HELD FOR 5 DAYS BEFORE ISSUING A FINAL NEGATIVE REPORT Performed at [facility]    Report Status PENDING  Incomplete  Culture, blood (routine x 2)     Status: None (Preliminary result)   Collection Time: 08/11/14  7:08 AM  Result Value Ref Range Status   Specimen Description BLOOD LEFT HAND  Final   Special Requests BOTTLES DRAWN AEROBIC AND ANAEROBIC [place]  Final   Culture   Final           BLOOD CULTURE RECEIVED NO GROWTH TO DATE CULTURE WILL BE HELD FOR 5 DAYS BEFORE ISSUING A FINAL NEGATIVE REPORT Performed at [facility]    Report Status PENDING  Incomplete  Culture, Urine     Status: None   Collection Time: 08/12/14  5:47 AM  Result Value Ref Range Status   Specimen Description URINE, RANDOM  Final   Special Requests NONE  Final  Colony Count   Final    3,000 COLONIES/ML Performed at [facility]    Culture   Final    INSIGNIFICANT GROWTH Performed at [facility]    Report Status 08/13/2014 FINAL  Final    Medical History: Past Medical History   Diagnosis Date  . Diabetes mellitus   . Hypertension   . Peripheral neuropathy     Medications:  Anti-infectives    Start     Dose/Rate Route Frequency Ordered Stop   08/12/14 1800  vancomycin (VANCOCIN) 500 mg in sodium chloride 0.9 % 100 mL IVPB  Status:  Discontinued     500 mg 100 mL/hr over 60 Minutes Intravenous Every 12 hours 08/12/14 0705 08/12/14 0721   08/12/14 1800  vancomycin (VANCOCIN) IVPB 750 mg/150 ml premix  Status:  Discontinued     750 mg 150 mL/hr over 60 Minutes Intravenous Every 12 hours 08/12/14 0721 08/12/14 1315   08/10/14 2138  vancomycin (VANCOCIN) powder  Status:  Discontinued       As needed 08/10/14 2138 08/10/14 2153   08/10/14 1600  cefTRIAXone (ROCEPHIN) 2 g in dextrose 5 % 50 mL IVPB - Premix  Status:  Discontinued     2 g 100 mL/hr over 30 Minutes Intravenous Every 24 hours 08/09/14 1555 08/10/14 1432   08/10/14 1600  ceFAZolin (ANCEF) IVPB 2 g/50 mL premix  Status:  Discontinued     2 g 100 mL/hr over 30 Minutes Intravenous 3 times per day 08/10/14 1432 08/14/14 0845   08/10/14 0600  vancomycin (VANCOCIN) IVPB 750 mg/150 ml premix  Status:  Discontinued     750 mg 150 mL/hr over 60 Minutes Intravenous Every 12 hours 08/09/14 2018 08/12/14 0705   08/09/14 1800  vancomycin (VANCOCIN) 2,000 mg in sodium chloride 0.9 % 500 mL IVPB     2,000 mg 250 mL/hr over 120 Minutes Intravenous  Once 08/09/14 1616 08/09/14 1929   08/09/14 1630  cefTRIAXone (ROCEPHIN) 2 g in dextrose 5 % 50 mL IVPB - Premix     2 g 100 mL/hr over 30 Minutes Intravenous  Once 08/09/14 1553 08/09/14 1653     Assessment: 59 year old female with MSSA bacteremia and septic joint s/p I&D 2/25 to continue Ancef per pharmacy dosing. WBC 19.7 (upward trend), SCr 1.69 (rising) with estimated CrCl~ 40-45 mL/min. Last dose of Ancef given at 0550AM.    Goal of Therapy:  Clinical resolution of infection  Plan:  Ancef 2g IV q8h. Monitor renal function and need to adjust dosing further.   Monitor clinical status and toleration.   Sloan Leiter, PharmD, BCPS Clinical Pharmacist [phone] 08/14/2014,8:46 AM

## 2014-08-14 NOTE — Progress Notes (Signed)
Physical Therapy Treatment Patient Details Name: Brandy Houston MRN: [id number] DOB: Nov 24, 1955 Today's Date: 08/14/2014    History of Present Illness MSSA bacteremia with Polyarticular septic arthrits  s/p I&D of BUE's AC joint, BLE knee joint, and right hand    PT Comments    Pt progressing slowly.  Emphasized getting on her feet with as little stress placed on her UE's during UE assist.  Also stressed UE as well as LE ROM.  Will benefit well from a short stent in CIR.  Follow Up Recommendations  CIR     Equipment Recommendations  None recommended by PT    Recommendations for Other Services Rehab consult     Precautions / Restrictions Precautions Precautions: Fall Restrictions RLE Weight Bearing: Weight bearing as tolerated LLE Weight Bearing: Weight bearing as tolerated Other Position/Activity Restrictions: need to clarify with MD if any weight bearing restrictions on UE's    Mobility  Bed Mobility Overal bed mobility: Needs Assistance Bed Mobility: Supine to Sit;Sit to Supine     Supine to sit: Mod assist     General bed mobility comments: pt used momentum to scoot to EOB  Transfers Overall transfer level: Needs assistance   Transfers: Sit to/from Stand;Stand Pivot Transfers Sit to Stand: Mod assist;From elevated surface (will take 2 person assist from lower surface.) Stand pivot transfers: Mod assist       General transfer comment: Pivot with RW and small painful steps.  Ambulation/Gait             General Gait Details: see transfer only   Stairs            Wheelchair Mobility    Modified Rankin (Stroke Patients Only)       Balance Overall balance assessment: Needs assistance Sitting-balance support: No upper extremity supported Sitting balance-Leahy Scale: Good Sitting balance - Comments: weight shifts and scoots as well as accepts challenge.   Standing balance support: Bilateral upper extremity supported Standing balance-Leahy  Scale: Poor                      Cognition Arousal/Alertness: Awake/alert Behavior During Therapy: WFL for tasks assessed/performed Overall Cognitive Status: Within Functional Limits for tasks assessed                      Exercises General Exercises - Lower Extremity Ankle Circles/Pumps: AROM;Both;10 reps;Supine Heel Slides: Both;10 reps;Supine;AAROM Other Exercises Other Exercises: bicep/tricep press/ shoulder ROM x10 reps.--graded assist    General Comments        Pertinent Vitals/Pain Pain Assessment: Faces Faces Pain Scale: Hurts whole lot Pain Location: shoulders and knees Pain Descriptors / Indicators: Grimacing;Sore Pain Intervention(s): Limited activity within patient's tolerance;Repositioned    Home Living                      Prior Function            PT Goals (current goals can now be found in the care plan section) Acute Rehab PT Goals Patient Stated Goal: go home, feel better PT Goal Formulation: With patient Time For Goal Achievement: 08/27/14 Potential to Achieve Goals: Good Progress towards PT goals: Progressing toward goals    Frequency  Min 4X/week    PT Plan Current plan remains appropriate    Co-evaluation             End of Session Equipment Utilized During Treatment: Gait belt Activity Tolerance: Patient tolerated treatment well Patient  left: in chair;with call bell/phone within reach     Time: 1615-1640 PT Time Calculation (min) (ACUTE ONLY): 25 min  Charges:  $Therapeutic Exercise: 8-22 mins $Therapeutic Activity: 8-22 mins                    G Codes:      Elery Cadenhead, [name] 08/14/2014, 5:07 PM 08/14/2014  Donnella Sham, PT [phone]  (pager)

## 2014-08-14 NOTE — Progress Notes (Signed)
PATIENT DETAILS Name: Brandy Houston Age: 59 y.o. Sex: female Date of Birth: 1955-10-06 Admit Date: 08/07/2014 Admitting Physician Eber Dimichele, MD JB:[id number], Mateo Flow, NP  Subjective: Febrile last night, continues to have pain mostly in her right shoulder area.   Assessment/Plan: Principal Problem:   MSSA bacteremia with Polyarticular septic arthrits:Initally admitted with acute left shoulder pain, further work up with a MRI of left shoulder demonstrated a septic AC joint.Subsequent blood cultures positive for MSSA. ID and Orthopedics were consulted. Blood cultures on 2/24 was also positive for MSSA. Patient meanwhile started having pain/swelling of her B/L knee joints, Right should joint  as well. Further workup demonstarted septic arthritis of these joints.Patient subsequently underwent irrigation and debridement of Right knee, left knee, Right AC & left AC joints, and Irrigation and debridement of right hand flexor tendon sheath. ID directing ABx-now just on IV Ancef. For TEE 2/29.PICC line will be placed prior to discharge. Repeat Blood cultures on 2/26-negative so far. Unfortunately continues to have fever and leukocytosis-but no other areas of seeding evident. Will monitor closely.  Active Problems:   SIR's:secondary to above. Better with IVF and IV Abx. Still has persistent leukocytosis, not surprising given significant infectious burden.    Anemia:secondary to acute illness/CKD. Follow CBC periodically    DM-2:CBG's controlled with SSI. Stop Glipizide and resume on discharge. A1c 8.4. Follow    HTN: Controlled with Hydralazine. Follow and titrate medications accordingly    Acute on CKD Stage 3: likely ARF-from pre-renal azotemia. Creatinine slightly up. Follow.     Tobacco Abuse:transdermal nicotine, counseled  Disposition: Remain inpatient-suspect CIR on discharge.   Antibiotics:  See below   Anti-infectives    Start     Dose/Rate Route Frequency Ordered  Stop   08/14/14 1400  ceFAZolin (ANCEF) IVPB 2 g/50 mL premix     2 g 100 mL/hr over 30 Minutes Intravenous 3 times per day 08/14/14 0850     08/12/14 1800  vancomycin (VANCOCIN) 500 mg in sodium chloride 0.9 % 100 mL IVPB  Status:  Discontinued     500 mg 100 mL/hr over 60 Minutes Intravenous Every 12 hours 08/12/14 0705 08/12/14 0721   08/12/14 1800  vancomycin (VANCOCIN) IVPB 750 mg/150 ml premix  Status:  Discontinued     750 mg 150 mL/hr over 60 Minutes Intravenous Every 12 hours 08/12/14 0721 08/12/14 1315   08/10/14 2138  vancomycin (VANCOCIN) powder  Status:  Discontinued       As needed 08/10/14 2138 08/10/14 2153   08/10/14 1600  cefTRIAXone (ROCEPHIN) 2 g in dextrose 5 % 50 mL IVPB - Premix  Status:  Discontinued     2 g 100 mL/hr over 30 Minutes Intravenous Every 24 hours 08/09/14 1555 08/10/14 1432   08/10/14 1600  ceFAZolin (ANCEF) IVPB 2 g/50 mL premix  Status:  Discontinued     2 g 100 mL/hr over 30 Minutes Intravenous 3 times per day 08/10/14 1432 08/14/14 0845   08/10/14 0600  vancomycin (VANCOCIN) IVPB 750 mg/150 ml premix  Status:  Discontinued     750 mg 150 mL/hr over 60 Minutes Intravenous Every 12 hours 08/09/14 2018 08/12/14 0705   08/09/14 1800  vancomycin (VANCOCIN) 2,000 mg in sodium chloride 0.9 % 500 mL IVPB     2,000 mg 250 mL/hr over 120 Minutes Intravenous  Once 08/09/14 1616 08/09/14 1929   08/09/14 1630  cefTRIAXone (ROCEPHIN) 2 g in dextrose 5 % 50 mL IVPB -  Premix     2 g 100 mL/hr over 30 Minutes Intravenous  Once 08/09/14 1553 08/09/14 1653      DVT Prophylaxis: Prophylactic Heparin   Code Status: Full code   Family Communication None at bedside  Procedures:  2/25: IR left AC aspiration of joint fluid with 1-2cc fluid aspirated  2/25: Right knee aspirated with 100 cc of turbid fluid   2/25: Arthroscopic I&D of R knee with synovectomy  2/25: Arthrotomy of right AC joint with debridement of bone, SQ tissue, and muscle  2/25:  Arthrotomy of left AC joint with I&D of SQ tissue and muscle  2/26: Irrigation and debridement of right hand flexor tendon sheath from areas of the palm over the A1 pulley and the proximal middle phalanx of the middle finger.  2/26:I and D of superficial deep tissues, right volar forearm.  2/26Arthroscopic irrigation and debridement of septic arthritis of left knee  CONSULTS:  ID and orthopedic surgery   MEDICATIONS: Scheduled Meds: . antiseptic oral rinse  7 mL Mouth Rinse q12n4p  . aspirin  81 mg Oral Daily  .  ceFAZolin (ANCEF) IV  2 g Intravenous 3 times per day  . chlorhexidine  15 mL Mouth Rinse BID  . cyclobenzaprine  5 mg Oral BID  . famotidine  20 mg Oral BID  . ferrous gluconate  324 mg Oral Q breakfast  . heparin subcutaneous  5,000 Units Subcutaneous 3 times per day  . hydrALAZINE  25 mg Oral TID  . insulin aspart  0-9 Units Subcutaneous TID WC  . nicotine  21 mg Transdermal Daily  . polyethylene glycol  17 g Oral BID  . sodium chloride  3 mL Intravenous Q12H   Continuous Infusions: . dextrose 5 % and 0.45% NaCl 40 mL/hr at 08/13/14 1242   PRN Meds:.acetaminophen, HYDROcodone-acetaminophen, HYDROmorphone (DILAUDID) injection, promethazine    PHYSICAL EXAM: Vital signs in last 24 hours: Filed Vitals:   08/13/14 1634 08/13/14 2115 08/13/14 2216 08/14/14 0606  BP: 153/75 130/62  161/72  Pulse: 99 107  110  Temp: 98.6 F (37 C) 101.4 F (38.6 C) 99.7 F (37.6 C) 100.3 F (37.9 C)  TempSrc: Oral Oral Oral Oral  Resp: 18   18  Height:      Weight:      SpO2: 94% 92%  95%    Weight change:  Filed Weights   08/07/14 2009  Weight: 90.22 kg (198 lb 14.4 oz)   Body mass index is 30.67 kg/(m^2).   Gen Exam: Awake and alert with clear speech.  Not in any distress Neck: Supple, No JVD.   Chest: B/L Clear.  No rales or rhonchi CVS: S1 S2 Regular, no murmurs.  Abdomen: soft, BS +, non tender, non distended.  Extremities: no edema, lower extremities warm  to touch.Both lower legs in bandage Neurologic: Non Focal.   Skin: No Rash.   Wounds: N/A.   Intake/Output from previous day:  Intake/Output Summary (Last 24 hours) at 08/14/14 0930 Last data filed at 08/14/14 [id number]  Gross per 24 hour  Intake 794.25 ml  Output    550 ml  Net 244.25 ml     LAB RESULTS: CBC  Recent Labs Lab 08/07/14 1311  08/10/14 0450 08/11/14 0700 08/12/14 0522 08/13/14 0809 08/14/14 0651  WBC 11.5*  < > 15.0* 16.4* 18.2* 19.1* 19.7*  HGB 11.6*  < > 11.2* 9.8* 9.6* 9.9* 9.0*  HCT 34.7*  < > 33.6* 29.7* 29.1* 29.6* 27.1*  PLT 201  < >  257 283 285 308 348  MCV 94.0  < > 93.6 93.4 92.4 92.2 92.2  MCH 31.4  < > 31.2 30.8 30.5 30.8 30.6  MCHC 33.4  < > 33.3 33.0 33.0 33.4 33.2  RDW 13.1  < > 13.1 13.3 13.6 13.3 13.2  LYMPHSABS 0.4*  --   --   --   --   --   --   MONOABS 0.4  --   --   --   --   --   --   EOSABS 0.0  --   --   --   --   --   --   BASOSABS 0.0  --   --   --   --   --   --   < > = values in this interval not displayed.  Chemistries   Recent Labs Lab 08/10/14 0450 08/11/14 0700 08/12/14 0522 08/13/14 0809 08/14/14 0651  NA 128* 133* 136 133* 132*  K 4.0 3.4* 3.6 3.4* 3.8  CL 100 99 100 98 96  CO2 23 26 26 24 26   GLUCOSE 159* 158* 112* 164* 178*  BUN 30* 27* 21 23 31*  CREATININE 1.52* 1.54* 1.42* 1.35* 1.69*  CALCIUM 7.9* 7.8* 8.0* 8.2* 8.0*    CBG:  Recent Labs Lab 08/13/14 0838 08/13/14 1235 08/13/14 1654 08/13/14 2215 08/14/14 0914  GLUCAP 166* 202* 178* 166* 178*    GFR Estimated Creatinine Clearance: 41.8 mL/min (by C-G formula based on Cr of 1.69).  Coagulation profile  Recent Labs Lab 08/07/14 1603  INR 1.11    Cardiac Enzymes  Recent Labs Lab 08/08/14 0020 08/08/14 0520  TROPONINI 0.09* 0.08*    Invalid input(s): POCBNP No results for input(s): DDIMER in the last 72 hours. No results for input(s): HGBA1C in the last 72 hours. No results for input(s): CHOL, HDL, LDLCALC, TRIG, CHOLHDL,  LDLDIRECT in the last 72 hours. No results for input(s): TSH, T4TOTAL, T3FREE, THYROIDAB in the last 72 hours.  Invalid input(s): FREET3 No results for input(s): VITAMINB12, FOLATE, FERRITIN, TIBC, IRON, RETICCTPCT in the last 72 hours. No results for input(s): LIPASE, AMYLASE in the last 72 hours.  Urine Studies No results for input(s): UHGB, CRYS in the last 72 hours.  Invalid input(s): UACOL, UAPR, USPG, UPH, UTP, UGL, UKET, UBIL, UNIT, UROB, ULEU, UEPI, UWBC, URBC, UBAC, CAST, UCOM, BILUA  MICROBIOLOGY: Recent Results (from the past 240 hour(s))  Culture, blood (routine x 2)     Status: None   Collection Time: 08/09/14 12:15 PM  Result Value Ref Range Status   Specimen Description BLOOD RIGHT HAND  Final   Special Requests   Final    BOTTLES DRAWN AEROBIC AND ANAEROBIC 7CCS BOTH BOTTLES   Culture   Final    STAPHYLOCOCCUS AUREUS Note: RIFAMPIN AND GENTAMICIN SHOULD NOT BE USED AS SINGLE DRUGS FOR TREATMENT OF STAPH INFECTIONS. Note: Gram Stain Report Called to,Read Back By and Verified With: Shirlean Kelly 700AM 08/10/14 [place] Performed at [facility]    Report Status 08/12/2014 FINAL  Final   Organism ID, Bacteria STAPHYLOCOCCUS AUREUS  Final      Susceptibility   Staphylococcus aureus - MIC*    CLINDAMYCIN <=0.25 SENSITIVE Sensitive     ERYTHROMYCIN <=0.25 SENSITIVE Sensitive     GENTAMICIN <=0.5 SENSITIVE Sensitive     LEVOFLOXACIN 0.25 SENSITIVE Sensitive     OXACILLIN <=0.25 SENSITIVE Sensitive     PENICILLIN >=0.5 RESISTANT Resistant     RIFAMPIN <=0.5  SENSITIVE Sensitive     TRIMETH/SULFA <=10 SENSITIVE Sensitive     VANCOMYCIN 1 SENSITIVE Sensitive     TETRACYCLINE <=1 SENSITIVE Sensitive     MOXIFLOXACIN <=0.25 SENSITIVE Sensitive     * STAPHYLOCOCCUS AUREUS  Culture, blood (routine x 2)     Status: None   Collection Time: 08/09/14 12:25 PM  Result Value Ref Range Status   Specimen Description BLOOD RIGHT ARM  Final   Special Requests   Final      BOTTLES DRAWN AEROBIC AND ANAEROBIC 10CC BOTH BOTTLES   Culture   Final    STAPHYLOCOCCUS AUREUS Note: SUSCEPTIBILITIES PERFORMED ON PREVIOUS CULTURE WITHIN THE LAST 5 DAYS. Note: Gram Stain Report Called to,Read Back By and Verified With: Shirlean Kelly 700AM 08/10/14 [place] Performed at [facility]    Report Status 08/12/2014 FINAL  Final  Culture, routine-abscess     Status: None   Collection Time: 08/10/14 11:54 AM  Result Value Ref Range Status   Specimen Description SHOULDER LEFT  Final   Special Requests NONE  Final   Gram Stain   Final    ABUNDANT WBC PRESENT, PREDOMINANTLY PMN NO SQUAMOUS EPITHELIAL CELLS SEEN MODERATE GRAM POSITIVE COCCI IN PAIRS IN CLUSTERS Performed at [facility]    Culture   Final    MODERATE STAPHYLOCOCCUS AUREUS Note: RIFAMPIN AND GENTAMICIN SHOULD NOT BE USED AS SINGLE DRUGS FOR TREATMENT OF STAPH INFECTIONS. Performed at [facility]    Report Status 08/13/2014 FINAL  Final   Organism ID, Bacteria STAPHYLOCOCCUS AUREUS  Final      Susceptibility   Staphylococcus aureus - MIC*    CLINDAMYCIN <=0.25 SENSITIVE Sensitive     ERYTHROMYCIN <=0.25 SENSITIVE Sensitive     GENTAMICIN <=0.5 SENSITIVE Sensitive     LEVOFLOXACIN 0.25 SENSITIVE Sensitive     OXACILLIN 0.5 SENSITIVE Sensitive     PENICILLIN >=0.5 RESISTANT Resistant     RIFAMPIN <=0.5 SENSITIVE Sensitive     TRIMETH/SULFA <=10 SENSITIVE Sensitive     VANCOMYCIN 1 SENSITIVE Sensitive     TETRACYCLINE <=1 SENSITIVE Sensitive     MOXIFLOXACIN <=0.25 SENSITIVE Sensitive     * MODERATE STAPHYLOCOCCUS AUREUS  Anaerobic culture     Status: None (Preliminary result)   Collection Time: 08/10/14 11:54 AM  Result Value Ref Range Status   Specimen Description SHOULDER LEFT  Final   Special Requests NONE  Final   Gram Stain   Final    MODERATE WBC PRESENT, PREDOMINANTLY PMN NO SQUAMOUS EPITHELIAL CELLS SEEN MODERATE GRAM POSITIVE COCCI IN PAIRS IN  CLUSTERS Performed at [facility]    Culture   Final    No Anaerobes Isolated; Culture in Progress for 14 DAYS Performed at [facility]    Report Status PENDING  Incomplete  Body fluid culture     Status: None   Collection Time: 08/10/14  5:22 PM  Result Value Ref Range Status   Specimen Description SYNOVIAL JOINT RIGHT KNEE  Final   Special Requests Normal  Final   Gram Stain   Final    ABUNDANT WBC PRESENT,BOTH PMN AND MONONUCLEAR NO ORGANISMS SEEN Performed at [facility]    Culture   Final    FEW STAPHYLOCOCCUS AUREUS Note: RIFAMPIN AND GENTAMICIN SHOULD NOT BE USED AS SINGLE DRUGS FOR TREATMENT OF STAPH INFECTIONS. CRITICAL RESULT CALLED TO, READ BACK BY AND VERIFIED WITH: CINDY FLORES RN 08/13/14 AT 55 AM BY [facility] Performed at [facility]  Report Status 08/14/2014 FINAL  Final   Organism ID, Bacteria STAPHYLOCOCCUS AUREUS  Final      Susceptibility   Staphylococcus aureus - MIC*    CLINDAMYCIN <=0.25 SENSITIVE Sensitive     ERYTHROMYCIN <=0.25 SENSITIVE Sensitive     GENTAMICIN <=0.5 SENSITIVE Sensitive     LEVOFLOXACIN 0.25 SENSITIVE Sensitive     OXACILLIN 0.5 SENSITIVE Sensitive     PENICILLIN >=0.5 RESISTANT Resistant     RIFAMPIN <=0.5 SENSITIVE Sensitive     TRIMETH/SULFA <=10 SENSITIVE Sensitive     VANCOMYCIN <=0.5 SENSITIVE Sensitive     TETRACYCLINE <=1 SENSITIVE Sensitive     MOXIFLOXACIN <=0.25 SENSITIVE Sensitive     * FEW STAPHYLOCOCCUS AUREUS  Surgical pcr screen     Status: Abnormal   Collection Time: 08/10/14  6:42 PM  Result Value Ref Range Status   MRSA, PCR NEGATIVE NEGATIVE Final   Staphylococcus aureus POSITIVE (A) NEGATIVE Final    Comment:        The Xpert SA Assay (FDA approved for NASAL specimens in patients over 46 years of age), is one component of a comprehensive surveillance program.  Test performance has been validated by [facility] for patients greater than or equal to 65 year old. It  is not intended to diagnose infection nor to guide or monitor treatment.   Body fluid culture     Status: None   Collection Time: 08/10/14  9:14 PM  Result Value Ref Range Status   Specimen Description FLUID LEFT AC JOINT  Final   Special Requests VANCO, ROCEPHIN  Final   Gram Stain   Final    FEW WBC PRESENT, PREDOMINANTLY PMN MODERATE GRAM POSITIVE COCCI IN PAIRS IN CLUSTERS Gram Stain Report Called to,Read Back By and Verified With: Gram Stain Report Called to,Read Back By and Verified With: Laurance Flatten RN 08/11/13 9:10AM BY [place] Performed at [facility]    Culture   Final    ABUNDANT STAPHYLOCOCCUS AUREUS Note: RIFAMPIN AND GENTAMICIN SHOULD NOT BE USED AS SINGLE DRUGS FOR TREATMENT OF STAPH INFECTIONS. Performed at [facility]    Report Status 08/13/2014 FINAL  Final   Organism ID, Bacteria STAPHYLOCOCCUS AUREUS  Final      Susceptibility   Staphylococcus aureus - MIC*    CLINDAMYCIN <=0.25 SENSITIVE Sensitive     ERYTHROMYCIN <=0.25 SENSITIVE Sensitive     GENTAMICIN <=0.5 SENSITIVE Sensitive     LEVOFLOXACIN 0.25 SENSITIVE Sensitive     OXACILLIN <=0.25 SENSITIVE Sensitive     PENICILLIN >=0.5 RESISTANT Resistant     RIFAMPIN <=0.5 SENSITIVE Sensitive     TRIMETH/SULFA <=10 SENSITIVE Sensitive     VANCOMYCIN 1 SENSITIVE Sensitive     TETRACYCLINE <=1 SENSITIVE Sensitive     MOXIFLOXACIN <=0.25 SENSITIVE Sensitive     * ABUNDANT STAPHYLOCOCCUS AUREUS  Anaerobic culture     Status: None (Preliminary result)   Collection Time: 08/10/14  9:14 PM  Result Value Ref Range Status   Specimen Description FLUID LEFT AC JOINT  Final   Special Requests VANCO, ROCEPHIN  Final   Gram Stain   Final    FEW WBC PRESENT, PREDOMINANTLY PMN MODERATE GRAM POSITIVE COCCI IN PAIRS IN CLUSTERS Performed at [facility]    Culture   Final    NO ANAEROBES ISOLATED; CULTURE IN PROGRESS FOR 5 DAYS Performed at [facility]    Report Status PENDING   Incomplete  Culture, blood (routine x 2)  Status: None (Preliminary result)   Collection Time: 08/11/14  7:00 AM  Result Value Ref Range Status   Specimen Description BLOOD RIGHT ARM  Final   Special Requests BOTTLES DRAWN AEROBIC AND ANAEROBIC 6CC  Final   Culture   Final           BLOOD CULTURE RECEIVED NO GROWTH TO DATE CULTURE WILL BE HELD FOR 5 DAYS BEFORE ISSUING A FINAL NEGATIVE REPORT Performed at [facility]    Report Status PENDING  Incomplete  Culture, blood (routine x 2)     Status: None (Preliminary result)   Collection Time: 08/11/14  7:08 AM  Result Value Ref Range Status   Specimen Description BLOOD LEFT HAND  Final   Special Requests BOTTLES DRAWN AEROBIC AND ANAEROBIC [place]  Final   Culture   Final           BLOOD CULTURE RECEIVED NO GROWTH TO DATE CULTURE WILL BE HELD FOR 5 DAYS BEFORE ISSUING A FINAL NEGATIVE REPORT Performed at [facility]    Report Status PENDING  Incomplete  Culture, Urine     Status: None   Collection Time: 08/12/14  5:47 AM  Result Value Ref Range Status   Specimen Description URINE, RANDOM  Final   Special Requests NONE  Final   Colony Count   Final    3,000 COLONIES/ML Performed at [facility]    Culture   Final    INSIGNIFICANT GROWTH Performed at [facility]    Report Status 08/13/2014 FINAL  Final    RADIOLOGY STUDIES/RESULTS: Dg Chest 2 View  08/07/2014   CLINICAL DATA:  Left shoulder pain for 5 days. No known injury. Initial encounter.  EXAM: CHEST  2 VIEW  COMPARISON:  None.  FINDINGS: The heart size and mediastinal contours are within normal limits. Both lungs are clear. No pneumothorax or pleural effusion is noted. The visualized skeletal structures are unremarkable.  IMPRESSION: No acute cardiopulmonary abnormality seen.   Electronically Signed   By: Marijo Conception, M.D.   On: 08/07/2014 15:02   Ct Angio Chest Pe W/cm &/or Wo Cm  08/07/2014   CLINICAL DATA:  Tachycardia and hypoxia.  Left shoulder pain for 4 days. Renal insufficiency.  EXAM: CT ANGIOGRAPHY CHEST WITH CONTRAST  TECHNIQUE: Multidetector CT imaging of the chest was performed using the standard protocol during bolus administration of intravenous contrast. Multiplanar CT image reconstructions and MIPs were obtained to evaluate the vascular anatomy.  CONTRAST:  176mL OMNIPAQUE IOHEXOL 350 MG/ML SOLN. The patient was pre-medicated with Benadryl and Solu-Medrol due to a history of hives with Omnipaque.  COMPARISON:  08/07/2014; 10/18/2011  FINDINGS: Mediastinum/Nodes: No filling defect is identified in the pulmonary arterial tree to suggest pulmonary embolus. Mild atherosclerotic calcification of the aortic arch without acute aortic abnormality identified. No pathologic thoracic adenopathy. Mild cardiomegaly.  Lungs/Pleura: Secondary pulmonary lobular interstitial accentuation noted particularly at the lung apices. Mild atelectasis dependently in both lower lobes. No airspace opacity identified.  Upper abdomen: Unremarkable  Musculoskeletal: Mid thoracic spondylosis and mild kyphosis.  Review of the MIP images confirms the above findings.  IMPRESSION: 1. Mild cardiomegaly with secondary pulmonary lobular interstitial accentuation compatible with mild interstitial edema. 2. No embolus identified.   Electronically Signed   By: Van Clines M.D.   On: 08/07/2014 17:25   Mr Hand Right W Wo Contrast  08/11/2014   CLINICAL DATA:  RIGHT hand swelling. Septic arthritis. Previous aspirations and debridement for septic arthritis.  EXAM: MRI OF THE RIGHT HAND WITHOUT AND WITH CONTRAST  TECHNIQUE: Multiplanar, multisequence MR imaging was performed both before and after administration of intravenous contrast.  CONTRAST:  65mL MULTIHANCE GADOBENATE DIMEGLUMINE 529 MG/ML IV SOLN  COMPARISON:  None.  FINDINGS: Edema is present over the dorsum of the hand without abscess. There is flexor and extensor tenosynovitis in the long finger, which may  be septic or reactive. The other flexor and extensor compartments appear normal. No deep soft tissue abscess. Periarticular edema is present at third MCP joint however there is no chondrolysis or subchondral marrow edema identified at the third MCP joint. Small third MCP joint effusion. Septic arthritis is considered unlikely based on the lack of secondary findings. The IP joints appear within normal limits. Small cyst or prominent vascular entry zone is present in the radial aspect of the third metacarpal head.  IMPRESSION: Long finger extensor and flexor tenosynovitis, concerning for septic tenosynovitis. Inflammatory changes at the third MCP joint are likely reactive rather than septic arthritis based on the lack of marrow signal changes and chondrolysis. Cellulitis of the hand.   Electronically Signed   By: Dereck Ligas M.D.   On: 08/11/2014 12:35   Mr Shoulder Left Wo Contrast  08/09/2014   CLINICAL DATA:  Acute onset intractable left shoulder pain 08/03/2014 after are a lifting injury.  EXAM: MRI OF THE LEFT SHOULDER WITHOUT CONTRAST  TECHNIQUE: Multiplanar, multisequence MR imaging of the shoulder was performed. No intravenous contrast was administered.  COMPARISON:  Plain films left shoulder 08/07/2014.  FINDINGS: The study is degraded by patient motion.  Rotator cuff: Supraspinatus worse than infraspinatus tendinopathy without tear is identified.  Muscles:  No atrophy or focal lesion.  Biceps long head:  Intact.  Acromioclavicular Joint: Moderate appearing acromioclavicular osteoarthritis is present. There is fluid within the joint. Intense soft tissue edema is seen about the joint and surrounding fatty and muscular tissues including the deltoid and superior aspect of the supraspinatus. No focal fluid collection is identified.  Labrum:  Intact.  Bones: As described above. No fracture is identified. The acromion is type 2. There is fluid in the subacromial/subdeltoid bursa.  IMPRESSION: Dominant  finding is the abnormal appearance of the acromioclavicular joint highly suspicious for septic joint with surrounding cellulitis and myositis.  Moderate acromioclavicular osteoarthritis.  Mild supraspinatus and infraspinatus tendinopathy without tear.  These results will be called to the ordering clinician or representative by the Radiologist Assistant, and communication documented in the PACS or zVision Dashboard.   Electronically Signed   By: Inge Rise M.D.   On: 08/09/2014 08:48   Ir Fluoro Guide Ndl Plmt / Bx  08/10/2014   CLINICAL DATA:  59 year old female with left shoulder pain and evidence on recent MRI of septic arthritis of acromioclavicular joint. She has been referred for evaluation of aspiration.  EXAM: ULTRASOUND AND FLUOROSCOPIC GUIDED LEFT ACROMIOCLAVICULAR JOINT ASPIRATION.  TECHNIQUE: The procedure, risks, benefits, and alternatives were explained to the patient. Questions regarding the procedure were encouraged and answered. The patient understands and consents to the procedure.  Fluoroscopic image of the left shoulder performed. Ultrasound survey of the shoulder performed. Image was stored and sent to PACs with both modality.  Using fluoroscopy, the skin and subcutaneous tissues overlying the left acromioclavicular joint were generously infiltrated 1% lidocaine for local anesthesia.  We then used ultrasound guidance to advance an 18 gauge needle attached to a syringe into the left acromioclavicular joint fluid. Two separate aspiration were performed. Approximately 1-2 cc  of fluid were aspirated.  Samples were sent to the lab for analysis.  Patient tolerated the procedure well and remained hemodynamically stable throughout.  No complications were encountered and no significant blood loss was encountered.  COMPLICATIONS: None  IMPRESSION: Status post fluoroscopic and ultrasound-guided left AC joint aspiration with sample sent to the lab for analysis.  Signed,  Dulcy Fanny. Earleen Newport, DO   [facility]  [facility]   Electronically Signed   By: Corrie Mckusick D.O.   On: 08/10/2014 12:07   Dg Shoulder Left  08/07/2014   CLINICAL DATA:  59 year old female with left shoulder pain after have the lifting injury on 08/03/2014. Initial encounter.  EXAM: LEFT SHOULDER - 2+ VIEW  COMPARISON:  None.  FINDINGS: Bone mineralization is within normal limits for age. No glenohumeral joint dislocation. Proximal left humerus intact. Left clavicle and scapula appear intact. Negative visible left ribs and lung parenchyma.  IMPRESSION: No acute osseous abnormality identified at the left shoulder.   Electronically Signed   By: Genevie Ann M.D.   On: 08/07/2014 11:11    Oren Binet, MD  [facility] Pager:[phone]  If 7PM-7AM, please contact night-coverage [email or web address] Password [email or web address] 08/14/2014, 9:30 AM   LOS: 7 days

## 2014-08-14 NOTE — Progress Notes (Signed)
  Echocardiogram Echocardiogram Transesophageal has been performed.  Bobbye Charleston 08/14/2014, 12:17 PM

## 2014-08-14 NOTE — Progress Notes (Signed)
[name], female   DOB: February 02, 1956, 59 y.o.   MRN: [id number]         [place] for Infectious Disease    Date of Admission:  08/07/2014   Total days of antibiotics 6          Principal Problem:   Staphylococcus aureus bacteremia Active Problems:   Septic joint of left shoulder region   Septic joint of right knee joint   Septic joint of right hand   Sinus tachycardia   CKD (chronic kidney disease) stage 3, GFR 30-59 ml/min   SIRS (systemic inflammatory response syndrome)   Benign essential HTN   . antiseptic oral rinse  7 mL Mouth Rinse q12n4p  . aspirin  81 mg Oral Daily  .  ceFAZolin (ANCEF) IV  2 g Intravenous 3 times per day  . chlorhexidine  15 mL Mouth Rinse BID  . cyclobenzaprine  5 mg Oral BID  . famotidine  20 mg Oral BID  . ferrous gluconate  324 mg Oral Q breakfast  . heparin subcutaneous  5,000 Units Subcutaneous 3 times per day  . hydrALAZINE  25 mg Oral TID  . insulin aspart  0-9 Units Subcutaneous TID WC  . nicotine  21 mg Transdermal Daily  . polyethylene glycol  17 g Oral BID  . sodium chloride  3 mL Intravenous Q12H    Subjective: She is feeling a little bit better. Her joints are not as sore. She is complaining of restless legs. She denies any diarrhea  Review of Systems: Pertinent items are noted in HPI.  Past Medical History  Diagnosis Date  . Diabetes mellitus   . Hypertension   . Peripheral neuropathy     History  Substance Use Topics  . Smoking status: Current Every Day Smoker -- 0.50 packs/day for 20 years  . Smokeless tobacco: Never Used  . Alcohol Use: Yes     Comment: ocassionally    Family History  Problem Relation Age of Onset  . Cancer Mother   . Heart disease Father   . Cancer Sister   . Cancer Brother   . Diabetes Brother    Allergies  Allergen Reactions  . Compazine [Prochlorperazine] Shortness Of Breath and Swelling    TONGUE SWELLS  . Omnipaque [Iohexol] Hives  . Shellfish-Derived Products  Anaphylaxis  . Iodinated Diagnostic Agents Rash    OBJECTIVE: Blood pressure 161/72, pulse 110, temperature 99.3 F (37.4 C), temperature source Oral, resp. rate 16, height 5' 7.5" (1.715 m), weight 198 lb 14.4 oz (90.22 kg), last menstrual period 03/16/2002, SpO2 100 %.   General: she is alert and in no distress but possibly a little confused Skin: no splinter or conjunctival hemorrhages Lungs: clear Cor: regular S1 and S2 with an early 1/6 systolic murmur  Lab Results Lab Results  Component Value Date   WBC 19.7* 08/14/2014   HGB 9.0* 08/14/2014   HCT 27.1* 08/14/2014   MCV 92.2 08/14/2014   PLT 348 08/14/2014    Lab Results  Component Value Date   CREATININE 1.69* 08/14/2014   BUN 31* 08/14/2014   NA 132* 08/14/2014   K 3.8 08/14/2014   CL 96 08/14/2014   CO2 26 08/14/2014    Lab Results  Component Value Date   ALT 16 08/12/2014   AST 27 08/12/2014   ALKPHOS 97 08/12/2014   BILITOT 0.4 08/12/2014     Microbiology: Recent Results (from the past 240 hour(s))  Culture, blood (routine x 2)  Status: None   Collection Time: 08/09/14 12:15 PM  Result Value Ref Range Status   Specimen Description BLOOD RIGHT HAND  Final   Special Requests   Final    BOTTLES DRAWN AEROBIC AND ANAEROBIC 7CCS BOTH BOTTLES   Culture   Final    STAPHYLOCOCCUS AUREUS Note: RIFAMPIN AND GENTAMICIN SHOULD NOT BE USED AS SINGLE DRUGS FOR TREATMENT OF STAPH INFECTIONS. Note: Gram Stain Report Called to,Read Back By and Verified With: Shirlean Kelly 700AM 08/10/14 [place] Performed at [facility]    Report Status 08/12/2014 FINAL  Final   Organism ID, Bacteria STAPHYLOCOCCUS AUREUS  Final      Susceptibility   Staphylococcus aureus - MIC*    CLINDAMYCIN <=0.25 SENSITIVE Sensitive     ERYTHROMYCIN <=0.25 SENSITIVE Sensitive     GENTAMICIN <=0.5 SENSITIVE Sensitive     LEVOFLOXACIN 0.25 SENSITIVE Sensitive     OXACILLIN <=0.25 SENSITIVE Sensitive     PENICILLIN >=0.5  RESISTANT Resistant     RIFAMPIN <=0.5 SENSITIVE Sensitive     TRIMETH/SULFA <=10 SENSITIVE Sensitive     VANCOMYCIN 1 SENSITIVE Sensitive     TETRACYCLINE <=1 SENSITIVE Sensitive     MOXIFLOXACIN <=0.25 SENSITIVE Sensitive     * STAPHYLOCOCCUS AUREUS  Culture, blood (routine x 2)     Status: None   Collection Time: 08/09/14 12:25 PM  Result Value Ref Range Status   Specimen Description BLOOD RIGHT ARM  Final   Special Requests   Final    BOTTLES DRAWN AEROBIC AND ANAEROBIC 10CC BOTH BOTTLES   Culture   Final    STAPHYLOCOCCUS AUREUS Note: SUSCEPTIBILITIES PERFORMED ON PREVIOUS CULTURE WITHIN THE LAST 5 DAYS. Note: Gram Stain Report Called to,Read Back By and Verified With: Shirlean Kelly 700AM 08/10/14 [place] Performed at [facility]    Report Status 08/12/2014 FINAL  Final  Culture, routine-abscess     Status: None   Collection Time: 08/10/14 11:54 AM  Result Value Ref Range Status   Specimen Description SHOULDER LEFT  Final   Special Requests NONE  Final   Gram Stain   Final    ABUNDANT WBC PRESENT, PREDOMINANTLY PMN NO SQUAMOUS EPITHELIAL CELLS SEEN MODERATE GRAM POSITIVE COCCI IN PAIRS IN CLUSTERS Performed at [facility]    Culture   Final    MODERATE STAPHYLOCOCCUS AUREUS Note: RIFAMPIN AND GENTAMICIN SHOULD NOT BE USED AS SINGLE DRUGS FOR TREATMENT OF STAPH INFECTIONS. Performed at [facility]    Report Status 08/13/2014 FINAL  Final   Organism ID, Bacteria STAPHYLOCOCCUS AUREUS  Final      Susceptibility   Staphylococcus aureus - MIC*    CLINDAMYCIN <=0.25 SENSITIVE Sensitive     ERYTHROMYCIN <=0.25 SENSITIVE Sensitive     GENTAMICIN <=0.5 SENSITIVE Sensitive     LEVOFLOXACIN 0.25 SENSITIVE Sensitive     OXACILLIN 0.5 SENSITIVE Sensitive     PENICILLIN >=0.5 RESISTANT Resistant     RIFAMPIN <=0.5 SENSITIVE Sensitive     TRIMETH/SULFA <=10 SENSITIVE Sensitive     VANCOMYCIN 1 SENSITIVE Sensitive     TETRACYCLINE <=1 SENSITIVE  Sensitive     MOXIFLOXACIN <=0.25 SENSITIVE Sensitive     * MODERATE STAPHYLOCOCCUS AUREUS  Anaerobic culture     Status: None (Preliminary result)   Collection Time: 08/10/14 11:54 AM  Result Value Ref Range Status   Specimen Description SHOULDER LEFT  Final   Special Requests NONE  Final   Gram Stain   Final  MODERATE WBC PRESENT, PREDOMINANTLY PMN NO SQUAMOUS EPITHELIAL CELLS SEEN MODERATE GRAM POSITIVE COCCI IN PAIRS IN CLUSTERS Performed at [facility]    Culture   Final    No Anaerobes Isolated; Culture in Progress for 14 DAYS Performed at [facility]    Report Status PENDING  Incomplete  Body fluid culture     Status: None   Collection Time: 08/10/14  5:22 PM  Result Value Ref Range Status   Specimen Description SYNOVIAL JOINT RIGHT KNEE  Final   Special Requests Normal  Final   Gram Stain   Final    ABUNDANT WBC PRESENT,BOTH PMN AND MONONUCLEAR NO ORGANISMS SEEN Performed at [facility]    Culture   Final    FEW STAPHYLOCOCCUS AUREUS Note: RIFAMPIN AND GENTAMICIN SHOULD NOT BE USED AS SINGLE DRUGS FOR TREATMENT OF STAPH INFECTIONS. CRITICAL RESULT CALLED TO, READ BACK BY AND VERIFIED WITH: CINDY FLORES RN 08/13/14 AT 61 AM BY [facility] Performed at [facility]    Report Status 08/14/2014 FINAL  Final   Organism ID, Bacteria STAPHYLOCOCCUS AUREUS  Final      Susceptibility   Staphylococcus aureus - MIC*    CLINDAMYCIN <=0.25 SENSITIVE Sensitive     ERYTHROMYCIN <=0.25 SENSITIVE Sensitive     GENTAMICIN <=0.5 SENSITIVE Sensitive     LEVOFLOXACIN 0.25 SENSITIVE Sensitive     OXACILLIN 0.5 SENSITIVE Sensitive     PENICILLIN >=0.5 RESISTANT Resistant     RIFAMPIN <=0.5 SENSITIVE Sensitive     TRIMETH/SULFA <=10 SENSITIVE Sensitive     VANCOMYCIN <=0.5 SENSITIVE Sensitive     TETRACYCLINE <=1 SENSITIVE Sensitive     MOXIFLOXACIN <=0.25 SENSITIVE Sensitive     * FEW STAPHYLOCOCCUS AUREUS  Surgical pcr screen     Status: Abnormal     Collection Time: 08/10/14  6:42 PM  Result Value Ref Range Status   MRSA, PCR NEGATIVE NEGATIVE Final   Staphylococcus aureus POSITIVE (A) NEGATIVE Final    Comment:        The Xpert SA Assay (FDA approved for NASAL specimens in patients over 63 years of age), is one component of a comprehensive surveillance program.  Test performance has been validated by [facility] for patients greater than or equal to 81 year old. It is not intended to diagnose infection nor to guide or monitor treatment.   Body fluid culture     Status: None   Collection Time: 08/10/14  9:14 PM  Result Value Ref Range Status   Specimen Description FLUID LEFT AC JOINT  Final   Special Requests VANCO, ROCEPHIN  Final   Gram Stain   Final    FEW WBC PRESENT, PREDOMINANTLY PMN MODERATE GRAM POSITIVE COCCI IN PAIRS IN CLUSTERS Gram Stain Report Called to,Read Back By and Verified With: Gram Stain Report Called to,Read Back By and Verified With: Laurance Flatten RN 08/11/13 9:10AM BY [place] Performed at [facility]    Culture   Final    ABUNDANT STAPHYLOCOCCUS AUREUS Note: RIFAMPIN AND GENTAMICIN SHOULD NOT BE USED AS SINGLE DRUGS FOR TREATMENT OF STAPH INFECTIONS. Performed at [facility]    Report Status 08/13/2014 FINAL  Final   Organism ID, Bacteria STAPHYLOCOCCUS AUREUS  Final      Susceptibility   Staphylococcus aureus - MIC*    CLINDAMYCIN <=0.25 SENSITIVE Sensitive     ERYTHROMYCIN <=0.25 SENSITIVE Sensitive     GENTAMICIN <=0.5 SENSITIVE Sensitive     LEVOFLOXACIN 0.25 SENSITIVE Sensitive  OXACILLIN <=0.25 SENSITIVE Sensitive     PENICILLIN >=0.5 RESISTANT Resistant     RIFAMPIN <=0.5 SENSITIVE Sensitive     TRIMETH/SULFA <=10 SENSITIVE Sensitive     VANCOMYCIN 1 SENSITIVE Sensitive     TETRACYCLINE <=1 SENSITIVE Sensitive     MOXIFLOXACIN <=0.25 SENSITIVE Sensitive     * ABUNDANT STAPHYLOCOCCUS AUREUS  Anaerobic culture     Status: None (Preliminary result)   Collection  Time: 08/10/14  9:14 PM  Result Value Ref Range Status   Specimen Description FLUID LEFT AC JOINT  Final   Special Requests VANCO, ROCEPHIN  Final   Gram Stain   Final    FEW WBC PRESENT, PREDOMINANTLY PMN MODERATE GRAM POSITIVE COCCI IN PAIRS IN CLUSTERS Performed at [facility]    Culture   Final    NO ANAEROBES ISOLATED; CULTURE IN PROGRESS FOR 5 DAYS Performed at [facility]    Report Status PENDING  Incomplete  Culture, blood (routine x 2)     Status: None (Preliminary result)   Collection Time: 08/11/14  7:00 AM  Result Value Ref Range Status   Specimen Description BLOOD RIGHT ARM  Final   Special Requests BOTTLES DRAWN AEROBIC AND ANAEROBIC 6CC  Final   Culture   Final           BLOOD CULTURE RECEIVED NO GROWTH TO DATE CULTURE WILL BE HELD FOR 5 DAYS BEFORE ISSUING A FINAL NEGATIVE REPORT Performed at [facility]    Report Status PENDING  Incomplete  Culture, blood (routine x 2)     Status: None (Preliminary result)   Collection Time: 08/11/14  7:08 AM  Result Value Ref Range Status   Specimen Description BLOOD LEFT HAND  Final   Special Requests BOTTLES DRAWN AEROBIC AND ANAEROBIC [place]  Final   Culture   Final           BLOOD CULTURE RECEIVED NO GROWTH TO DATE CULTURE WILL BE HELD FOR 5 DAYS BEFORE ISSUING A FINAL NEGATIVE REPORT Performed at [facility]    Report Status PENDING  Incomplete  Culture, Urine     Status: None   Collection Time: 08/12/14  5:47 AM  Result Value Ref Range Status   Specimen Description URINE, RANDOM  Final   Special Requests NONE  Final   Colony Count   Final    3,000 COLONIES/ML Performed at [facility]    Culture   Final    INSIGNIFICANT GROWTH Performed at [facility]    Report Status 08/13/2014 FINAL  Final   TEE today: No vegetations seen  Assessment: She has MSSA bacteremia complicated by multiple septic joints. I will continue cefazolin alone for now. Repeat blood  cultures are negative at 72 hours and she has no evidence of endocarditis by TEE but she remains febrile with increasing leukocytosis.   Plan: 1. Continue cefazolin 2. Repeat blood cultures if fever persists  Michel Bickers, [place] for Infectious [place] [phone] pager   [phone] cell 08/14/2014, 3:10 PM

## 2014-08-14 NOTE — Consult Note (Signed)
Physical Medicine and Rehabilitation Consult Reason for Consult: MSSA bacteremia with polyarticular septic arthritis Referring Phsyician: [facility]    HPI: Brandy Houston is an 59 y.o. right handed female. With history of hypertension as well as diabetes mellitus peripheral neuropathy. Admitted 08/07/2014 with complaints of acute left shoulder pain. Patient works in Librarian, academic at [employer]. By report she was lifting a large bag when she developed acute onset of left shoulder pain. X-rays of left shoulder showed no acute osseous abnormality. Noted troponin of 0.09. She denied any chest pain. EKG negative for ST segment changes. Noted elevated d-dimer with CT angiogram of the chest showing no embolism. MRI of the left shoulder showed abnormal appearance of the acromioclavicular joint highly suspicious for septic joint with surrounding cellulitis and myositis. Patient developed low-grade fever 100.6 as well as leukocytosis 12,500-18,200. Infectious disease consulted 08/09/2014 and placed on broad-spectrum antibiotics. Underwent aspiration of shoulder per interventional radiology 08/10/2014. Blood cultures showing to sets with [facility] as well as noted increased fever to 101.8 and now with increasing right shoulder pain and knee pain. Suspect bacteremia/polyarticular septic arthritis and underwent arthroscopic irrigation and debridement of multiple joints including right knee, right acromioclavicular joint, right shoulder and left acromioclavicular joint 08/10/2014 per Dr. [name] followed by extensive debridement in 3 compartments of left knee 08/11/2014. TEE is pending. Question of right hand and forearm infection with infectious flexor tenosynovitis and underwent irrigation debridement proximal middle phalanx of the middle finger 08/12/2014 per Dr. Ninfa Linden. Await planned by infectious disease for duration of antibiotic therapy. Subcutaneous heparin for DVT prophylaxis. Hospital course pain management. Physical  therapy evaluation completed 08/13/2014 with recommendations of physical medicine rehabilitation consult.  Review of Systems  Gastrointestinal: Positive for constipation.  Musculoskeletal: Positive for myalgias and joint pain.  Neurological: Positive for weakness.  All other systems reviewed and are negative.  Past Medical History  Diagnosis Date  . Diabetes mellitus   . Hypertension   . Peripheral neuropathy    Past Surgical History  Procedure Laterality Date  . Cholecystectomy    . Appendectomy    . Small intestine surgery      Due to Small Bowel Obstruction  . Tubal ligation    . Knee arthroscopy Right 08/10/2014    Procedure: ARTHROSCOPY I & D KNEE;  Surgeon: Marianna Payment, MD;  Location: [facility];  Service: Orthopedics;  Laterality: Right;  . Shoulder arthroscopy Bilateral 08/10/2014    Procedure: I & D BILATERAL SHOULDERS ;  Surgeon: Marianna Payment, MD;  Location: [facility];  Service: Orthopedics;  Laterality: Bilateral;   Family History  Problem Relation Age of Onset  . Cancer Mother   . Heart disease Father   . Cancer Sister   . Cancer Brother   . Diabetes Brother    Social History:  reports that she has been smoking.  She has never used smokeless tobacco. She reports that she drinks alcohol. She reports that she does not use illicit drugs.   Allergies  Allergen Reactions  . Compazine [Prochlorperazine] Shortness Of Breath and Swelling    TONGUE SWELLS  . Omnipaque [Iohexol] Hives  . Shellfish-Derived Products Anaphylaxis  . Iodinated Diagnostic Agents Rash    Prior to Admission medications   Medication Sig Start Date End Date Taking? Authorizing Provider  aspirin 81 MG chewable tablet Chew 81-162 mg by mouth daily as needed (For stroke prevention.).    Yes Historical Provider, MD  Blood Glucose Monitoring Suppl (ON CALL EXPRESS GLUCOSE METER) DEVI Use  as directed 01/25/14  Yes Lance Bosch, NP  glipiZIDE (GLUCOTROL) 10 MG tablet Take 1 tablet (10 mg total)  by mouth 2 (two) times daily before a meal. 02/27/14  Yes Lance Bosch, NP  glucose blood test strip Use as instructed 01/25/14  Yes Lance Bosch, NP  hydrALAZINE (APRESOLINE) 25 MG tablet Take 1 tablet (25 mg total) by mouth 3 (three) times daily. 02/27/14  Yes Lance Bosch, NP  Menthol-Methyl Salicylate (MUSCLE RUB) 10-15 % CREA Apply 1 application topically daily as needed for muscle pain.   Yes Historical Provider, MD  TRUEPLUS LANCETS 30G MISC Use as directed 01/25/14  Yes Lance Bosch, NP  aspirin EC 81 MG tablet Take 1 tablet (81 mg total) by mouth daily as needed for pain. 04/14/13   Shanker Kristeen Mans, MD   Scheduled Medications: . antiseptic oral rinse  7 mL Mouth Rinse q12n4p  . aspirin  81 mg Oral Daily  .  ceFAZolin (ANCEF) IV  2 g Intravenous 3 times per day  . chlorhexidine  15 mL Mouth Rinse BID  . cyclobenzaprine  5 mg Oral BID  . famotidine  20 mg Oral BID  . ferrous gluconate  324 mg Oral Q breakfast  . heparin subcutaneous  5,000 Units Subcutaneous 3 times per day  . hydrALAZINE  25 mg Oral TID  . insulin aspart  0-9 Units Subcutaneous TID WC  . nicotine  21 mg Transdermal Daily  . polyethylene glycol  17 g Oral BID  . sodium chloride  3 mL Intravenous Q12H   PRN MED's: acetaminophen, HYDROcodone-acetaminophen, HYDROmorphone (DILAUDID) injection, promethazine Home: Home Living Family/patient expects to be discharged to:: Private residence Living Arrangements: Children Available Help at Discharge: Family, Available PRN/intermittently (will be alone during day) Type of Home: House Home Access: Stairs to enter [employer] of Steps: 4 Entrance Stairs-Rails: None Home Layout: One level Home Equipment: None  Functional History:   Functional Status:  Mobility:          ADL:    Cognition: Cognition Overall Cognitive Status: Within Functional Limits for tasks assessed Orientation Level: Oriented X4 Cognition Arousal/Alertness:  Awake/alert Behavior During Therapy: WFL for tasks assessed/performed Overall Cognitive Status: Within Functional Limits for tasks assessed  Blood pressure 161/72, pulse 110, temperature 100.3 F (37.9 C), temperature source Oral, resp. rate 18, height 5' 7.5" (1.715 m), weight 90.22 kg (198 lb 14.4 oz), last menstrual period 03/16/2002, SpO2 95 %. Physical Exam  Constitutional: She appears well-developed.  HENT:  Head: Normocephalic.  Eyes: EOM are normal.  Neck: Normal range of motion. Neck supple. No thyromegaly present.  Cardiovascular: Normal rate and regular rhythm.   Respiratory: Effort normal and breath sounds normal. No respiratory distress.  GI: Soft. Bowel sounds are normal. She exhibits no distension.  Neurological: She is alert.  Patient appears anxious and is moaning during exam. She is able to provide her name, age, date of birth and follow simple commands  Skin: Skin is warm and dry.  Psychiatric: She has a normal mood and affect. Her behavior is normal.    Results for orders placed or performed during the hospital encounter of 08/07/14 (from the past 24 hour(s))  CBC     Status: Abnormal   Collection Time: 08/13/14  8:09 AM  Result Value Ref Range   WBC 19.1 (H) 4.0 - 10.5 K/uL   RBC 3.21 (L) 3.87 - 5.11 MIL/uL   Hemoglobin 9.9 (L) 12.0 - 15.0 g/dL  HCT 29.6 (L) 36.0 - 46.0 %   MCV 92.2 78.0 - 100.0 fL   MCH 30.8 26.0 - 34.0 pg   MCHC 33.4 30.0 - 36.0 g/dL   RDW 13.3 11.5 - 15.5 %   Platelets 308 150 - 400 K/uL  Basic metabolic panel     Status: Abnormal   Collection Time: 08/13/14  8:09 AM  Result Value Ref Range   Sodium 133 (L) 135 - 145 mmol/L   Potassium 3.4 (L) 3.5 - 5.1 mmol/L   Chloride 98 96 - 112 mmol/L   CO2 24 19 - 32 mmol/L   Glucose, Bld 164 (H) 70 - 99 mg/dL   BUN 23 6 - 23 mg/dL   Creatinine, Ser 1.35 (H) 0.50 - 1.10 mg/dL   Calcium 8.2 (L) 8.4 - 10.5 mg/dL   GFR calc non Af Amer 42 (L) >90 mL/min   GFR calc Af Amer 49 (L) >90 mL/min    Anion gap 11 5 - 15  Glucose, capillary     Status: Abnormal   Collection Time: 08/13/14  8:38 AM  Result Value Ref Range   Glucose-Capillary 166 (H) 70 - 99 mg/dL  Glucose, capillary     Status: Abnormal   Collection Time: 08/13/14 12:35 PM  Result Value Ref Range   Glucose-Capillary 202 (H) 70 - 99 mg/dL  Glucose, capillary     Status: Abnormal   Collection Time: 08/13/14  4:54 PM  Result Value Ref Range   Glucose-Capillary 178 (H) 70 - 99 mg/dL  Glucose, capillary     Status: Abnormal   Collection Time: 08/13/14 10:15 PM  Result Value Ref Range   Glucose-Capillary 166 (H) 70 - 99 mg/dL   Comment 1 Notify RN    No results found.  Assessment/Plan: Diagnosis: septic joints, debility 1. Does the need for close, 24 hr/day medical supervision in concert with the patient's rehab needs make it unreasonable for this patient to be served in a less intensive setting? Yes 2. Co-Morbidities requiring supervision/potential complications: CKD, SIRS 3. Due to bladder management, bowel management, safety, skin/wound care, disease management, medication administration, pain management and patient education, does the patient require 24 hr/day rehab nursing? Yes 4. Does the patient require coordinated care of a physician, rehab nurse, PT (1-2 hrs/day, 5 days/week) and OT (1-2 hrs/day, 5 days/week) to address physical and functional deficits in the context of the above medical diagnosis(es)? Yes Addressing deficits in the following areas: balance, endurance, locomotion, strength, transferring, bowel/bladder control, bathing, dressing, feeding, grooming and toileting 5. Can the patient actively participate in an intensive therapy program of at least 3 hrs of therapy per day at least 5 days per week? Potentially 6. The potential for patient to make measurable gains while on inpatient rehab is good 7. Anticipated functional outcomes upon discharge from inpatients are ?supervision PT, ?supervision OT,  n/aSLP 8. Estimated rehab length of stay to reach the above functional goals is: to be determined 9. Does the patient have adequate social supports to accommodate these discharge functional goals? Potentially 10. Anticipated D/C setting: Home 11. Anticipated post D/C treatments: HH therapy and Outpatient therapy 12. Overall Rehab/Functional Prognosis: good  RECOMMENDATIONS: This patient's condition is appropriate for continued rehabilitative care in the following setting: CIR vs Home health---Will follow along for therapy notes.  Patient has agreed to participate in recommended program. Potentially Note that insurance prior authorization may be required for reimbursement for recommended care.  Comment:  Meredith Staggers, MD, [place]  Physical Medicine & Rehabilitation 08/14/2014    ANGIULLI,DANIEL J. 08/14/2014

## 2014-08-14 NOTE — CV Procedure (Signed)
    Transesophageal Echocardiogram Note  Lynnsie Orner [id number] Nov 06, 1955  Procedure: Transesophageal Echocardiogram Indications: sepsis   Procedure Details Consent: Obtained Time Out: Verified patient identification, verified procedure, site/side was marked, verified correct patient position, special equipment/implants available, Radiology Safety Procedures followed,  medications/allergies/relevent history reviewed, required imaging and test results available.  Performed  Medications: Fentanyl: 75 mcg iv  Versed: 5 mg iv   Left Ventrical:  Normal LV function  Mitral Valve: mild MR , no vegetation  Aortic Valve: moderata AI,  No vegetation  Tricuspid Valve: mild TR , no vegetation  Pulmonic Valve: trivial  PI, no vegetation  Left Atrium/ Left atrial appendage: no thrombi  Atrial septum: normal   Aorta: normal    Complications: No apparent complications Patient did not tolerate procedure well.   Thayer Headings, Brooke Bonito., MD, [facility] 08/14/2014, 12:04 PM

## 2014-08-14 NOTE — Progress Notes (Signed)
   Subjective:  Patient reports pain as improved.  Febrile last night.  Objective:   VITALS:   Filed Vitals:   08/13/14 1634 08/13/14 2115 08/13/14 2216 08/14/14 0606  BP: 153/75 130/62  161/72  Pulse: 99 107  110  Temp: 98.6 F (37 C) 101.4 F (38.6 C) 99.7 F (37.6 C) 100.3 F (37.9 C)  TempSrc: Oral Oral Oral Oral  Resp: 18   18  Height:      Weight:      SpO2: 94% 92%  95%    Dressings c/d/i ROM of shoulder, hand, knees improved   Lab Results  Component Value Date   WBC 19.7* 08/14/2014   HGB 9.0* 08/14/2014   HCT 27.1* 08/14/2014   MCV 92.2 08/14/2014   PLT 348 08/14/2014     Assessment/Plan:  3 Days Post-Op   - WBCs still trending up - agree with TEE - continue IV abx - cx: MSSA - appreciate ID input - will follow  Marianna Payment 08/14/2014, 7:44 AM [phone]

## 2014-08-15 ENCOUNTER — Inpatient Hospital Stay (HOSPITAL_COMMUNITY): Payer: Medicaid Other

## 2014-08-15 ENCOUNTER — Encounter (HOSPITAL_COMMUNITY): Payer: Self-pay | Admitting: Cardiovascular Disease

## 2014-08-15 DIAGNOSIS — R2231 Localized swelling, mass and lump, right upper limb: Secondary | ICD-10-CM

## 2014-08-15 DIAGNOSIS — N189 Chronic kidney disease, unspecified: Secondary | ICD-10-CM

## 2014-08-15 DIAGNOSIS — N179 Acute kidney failure, unspecified: Secondary | ICD-10-CM

## 2014-08-15 LAB — GLUCOSE, CAPILLARY
Glucose-Capillary: 196 mg/dL — ABNORMAL HIGH (ref 70–99)
Glucose-Capillary: 232 mg/dL — ABNORMAL HIGH (ref 70–99)
Glucose-Capillary: 195 mg/dL — ABNORMAL HIGH (ref 70–99)
Glucose-Capillary: 196 mg/dL — ABNORMAL HIGH (ref 70–99)
Glucose-Capillary: 203 mg/dL — ABNORMAL HIGH (ref 70–99)

## 2014-08-15 LAB — BASIC METABOLIC PANEL
Anion gap: 7 (ref 5–15)
BUN: 41 mg/dL — ABNORMAL HIGH (ref 6–23)
CO2: 24 mmol/L (ref 19–32)
Calcium: 7.9 mg/dL — ABNORMAL LOW (ref 8.4–10.5)
Chloride: 96 mmol/L (ref 96–112)
Creatinine, Ser: 3.11 mg/dL — ABNORMAL HIGH (ref 0.50–1.10)
GFR calc Af Amer: 18 mL/min — ABNORMAL LOW (ref >90)
GFR calc non Af Amer: 15 mL/min — ABNORMAL LOW (ref >90)
Glucose, Bld: 217 mg/dL — ABNORMAL HIGH (ref 70–99)
Potassium: 3.4 mmol/L — ABNORMAL LOW (ref 3.5–5.1)
Sodium: 127 mmol/L — ABNORMAL LOW (ref 135–145)

## 2014-08-15 LAB — CBC WITH DIFFERENTIAL/PLATELET
Basophils Absolute: 0 10*3/uL (ref 0.0–0.1)
Basophils Relative: 0 % (ref 0–1)
Eosinophils Absolute: 0.1 10*3/uL (ref 0.0–0.7)
Eosinophils Relative: 1 % (ref 0–5)
HCT: 24 % — ABNORMAL LOW (ref 36.0–46.0)
Hemoglobin: 8.1 g/dL — ABNORMAL LOW (ref 12.0–15.0)
Lymphocytes Relative: 12 % (ref 12–46)
Lymphs Abs: 1.6 10*3/uL (ref 0.7–4.0)
MCH: 30.8 pg (ref 26.0–34.0)
MCHC: 33.8 g/dL (ref 30.0–36.0)
MCV: 91.3 fL (ref 78.0–100.0)
Monocytes Absolute: 0.9 10*3/uL (ref 0.1–1.0)
Monocytes Relative: 7 % (ref 3–12)
Neutro Abs: 10.6 10*3/uL — ABNORMAL HIGH (ref 1.7–7.7)
Neutrophils Relative %: 80 % — ABNORMAL HIGH (ref 43–77)
Platelets: 349 10*3/uL (ref 150–400)
RBC: 2.63 MIL/uL — ABNORMAL LOW (ref 3.87–5.11)
RDW: 13.3 % (ref 11.5–15.5)
WBC: 13.2 10*3/uL — ABNORMAL HIGH (ref 4.0–10.5)

## 2014-08-15 LAB — HCV RNA QUANT
HCV Quantitative Log: 6.9 {Log} — ABNORMAL HIGH (ref <1.18)
HCV Quantitative Log: 6.43 {Log} — ABNORMAL HIGH (ref <1.18)
HCV Quantitative: 8002196 IU/mL — ABNORMAL HIGH (ref <15)
HCV Quantitative: 2711766 IU/mL — ABNORMAL HIGH (ref <15)

## 2014-08-15 LAB — SODIUM, URINE, RANDOM: Sodium, Ur: 12 mmol/L

## 2014-08-15 LAB — OSMOLALITY: Osmolality: 286 mOsm/kg (ref 275–300)

## 2014-08-15 LAB — ANAEROBIC CULTURE

## 2014-08-15 LAB — CREATININE, URINE, RANDOM: Creatinine, Urine: 200.26 mg/dL

## 2014-08-15 LAB — OSMOLALITY, URINE: Osmolality, Ur: 329 mOsm/kg — ABNORMAL LOW (ref 390–1090)

## 2014-08-15 MED ORDER — NAFCILLIN SODIUM 2 G IJ SOLR
2.0000 g | INTRAMUSCULAR | Status: DC
  Administered 2014-08-15 – 2014-08-16 (×6): 2 g via INTRAVENOUS
  Filled 2014-08-15 (×10): qty 2000

## 2014-08-15 MED ORDER — FAMOTIDINE 20 MG PO TABS
20.0000 mg | ORAL_TABLET | Freq: Every day | ORAL | Status: DC
  Administered 2014-08-16 – 2014-09-14 (×29): 20 mg via ORAL
  Filled 2014-08-15 (×30): qty 1

## 2014-08-15 MED ORDER — AMLODIPINE BESYLATE 5 MG PO TABS
5.0000 mg | ORAL_TABLET | Freq: Every day | ORAL | Status: DC
  Administered 2014-08-15 – 2014-08-18 (×4): 5 mg via ORAL
  Filled 2014-08-15 (×5): qty 1

## 2014-08-15 MED ORDER — SODIUM CHLORIDE 0.9 % IV SOLN
INTRAVENOUS | Status: DC
  Administered 2014-08-15 – 2014-08-16 (×2): via INTRAVENOUS
  Administered 2014-08-16: 1000 mL via INTRAVENOUS
  Administered 2014-08-17 – 2014-08-19 (×6): via INTRAVENOUS
  Administered 2014-08-19: 100 mL/h via INTRAVENOUS
  Administered 2014-08-19 – 2014-08-24 (×5): via INTRAVENOUS

## 2014-08-15 NOTE — Progress Notes (Signed)
PATIENT DETAILS Name: Brandy Houston Age: 59 y.o. Sex: female Date of Birth: 01/24/1956 Admit Date: 08/07/2014 Admitting Physician Eber Heathcock, MD JB:[id number], Mateo Flow, NP  Brief summary:  Patient is a 59 year old African-American female with history of hypertension, diabetes, peripheral neuropathy who presented to [facility] emergency room on 2/22 with severe left shoulder pain. Further workup demonstrated MSSA bacteremia with septic arthritis of numerous joints. Patient underwent a TEE that was negative. Unfortunately continues to have intermittent fever and leukocytosis. ID continues to follow and is directing antibiotic treatment.  Subjective: Feels better  Assessment/Plan: Principal Problem:   MSSA bacteremia with Polyarticular septic arthrits:Initally admitted with acute left shoulder pain, further work up with a MRI of left shoulder demonstrated a septic AC joint.Subsequent blood cultures positive for MSSA. ID and Orthopedics were consulted. Blood cultures on 2/24 was also positive for MSSA. Patient meanwhile started having pain/swelling of her B/L knee joints, Right should joint  as well. Further workup demonstarted septic arthritis of these joints.Patient subsequently underwent irrigation and debridement of Right knee, left knee, Right AC & left AC joints, and Irrigation and debridement of right hand flexor tendon sheath. Underwent TEE on 2/29 that was negative. Fortunately continues to have fevers, infectious disease recommends we switched from Ancef to nafcillin. Await repeat blood work today, but plans are to get MRI spine ultimately.  Active Problems:   SIR's:secondary to above. Better with IVF and IV Abx. Still has persistent leukocytosis, not surprising given significant infectious burden. See above.    Anemia:secondary to acute illness/CKD. Follow CBC periodically    DM-2:CBG's controlled with SSI. Stop Glipizide and resume on discharge. A1c 8.4. Follow    HTN:  Moderate Control with Hydralazine. Add amlodipine, Follow and titrate medications accordingly    Acute on CKD Stage 3: likely ARF-from pre-renal azotemia. Creatinine slightly up. Follow. Await lytes today    Tobacco Abuse:transdermal nicotine, counseled  Disposition: Remain inpatient-suspect CIR on discharge-when cleared by infectious disease  Antibiotics:  See below   Anti-infectives    Start     Dose/Rate Route Frequency Ordered Stop   08/15/14 1200  nafcillin 2 g in dextrose 5 % 50 mL IVPB     2 g 100 mL/hr over 30 Minutes Intravenous 6 times per day 08/15/14 1028     08/14/14 1400  ceFAZolin (ANCEF) IVPB 2 g/50 mL premix  Status:  Discontinued     2 g 100 mL/hr over 30 Minutes Intravenous 3 times per day 08/14/14 0850 08/15/14 1028   08/12/14 1800  vancomycin (VANCOCIN) 500 mg in sodium chloride 0.9 % 100 mL IVPB  Status:  Discontinued     500 mg 100 mL/hr over 60 Minutes Intravenous Every 12 hours 08/12/14 0705 08/12/14 0721   08/12/14 1800  vancomycin (VANCOCIN) IVPB 750 mg/150 ml premix  Status:  Discontinued     750 mg 150 mL/hr over 60 Minutes Intravenous Every 12 hours 08/12/14 0721 08/12/14 1315   08/10/14 2138  vancomycin (VANCOCIN) powder  Status:  Discontinued       As needed 08/10/14 2138 08/10/14 2153   08/10/14 1600  cefTRIAXone (ROCEPHIN) 2 g in dextrose 5 % 50 mL IVPB - Premix  Status:  Discontinued     2 g 100 mL/hr over 30 Minutes Intravenous Every 24 hours 08/09/14 1555 08/10/14 1432   08/10/14 1600  ceFAZolin (ANCEF) IVPB 2 g/50 mL premix  Status:  Discontinued     2 g 100 mL/hr over  30 Minutes Intravenous 3 times per day 08/10/14 1432 08/14/14 0845   08/10/14 0600  vancomycin (VANCOCIN) IVPB 750 mg/150 ml premix  Status:  Discontinued     750 mg 150 mL/hr over 60 Minutes Intravenous Every 12 hours 08/09/14 2018 08/12/14 0705   08/09/14 1800  vancomycin (VANCOCIN) 2,000 mg in sodium chloride 0.9 % 500 mL IVPB     2,000 mg 250 mL/hr over 120 Minutes  Intravenous  Once 08/09/14 1616 08/09/14 1929   08/09/14 1630  cefTRIAXone (ROCEPHIN) 2 g in dextrose 5 % 50 mL IVPB - Premix     2 g 100 mL/hr over 30 Minutes Intravenous  Once 08/09/14 1553 08/09/14 1653      DVT Prophylaxis: Prophylactic Heparin   Code Status: Full code   Family Communication None at bedside  Procedures:  2/25: IR left AC aspiration of joint fluid with 1-2cc fluid aspirated  2/25: Right knee aspirated with 100 cc of turbid fluid   2/25: Arthroscopic I&D of R knee with synovectomy  2/25: Arthrotomy of right AC joint with debridement of bone, SQ tissue, and muscle  2/25: Arthrotomy of left AC joint with I&D of SQ tissue and muscle  2/26: Irrigation and debridement of right hand flexor tendon sheath from areas of the palm over the A1 pulley and the proximal middle phalanx of the middle finger.  2/26:I and D of superficial deep tissues, right volar forearm.  2/26Arthroscopic irrigation and debridement of septic arthritis of left knee  CONSULTS:  ID and orthopedic surgery   MEDICATIONS: Scheduled Meds: . antiseptic oral rinse  7 mL Mouth Rinse q12n4p  . aspirin  81 mg Oral Daily  . chlorhexidine  15 mL Mouth Rinse BID  . cyclobenzaprine  5 mg Oral BID  . famotidine  20 mg Oral BID  . ferrous gluconate  324 mg Oral Q breakfast  . heparin subcutaneous  5,000 Units Subcutaneous 3 times per day  . hydrALAZINE  25 mg Oral TID  . insulin aspart  0-9 Units Subcutaneous TID WC  . nafcillin IVPB 2 gram/50 mL D5W (Pyxis)  2 g Intravenous 6 times per day  . nicotine  21 mg Transdermal Daily  . polyethylene glycol  17 g Oral BID  . sodium chloride  3 mL Intravenous Q12H   Continuous Infusions: . dextrose 5 % and 0.45% NaCl 40 mL/hr at 08/14/14 2145   PRN Meds:.acetaminophen, HYDROcodone-acetaminophen, HYDROmorphone (DILAUDID) injection    PHYSICAL EXAM: Vital signs in last 24 hours: Filed Vitals:   08/14/14 1606 08/14/14 1809 08/14/14 2132 08/15/14  0615  BP:  147/70 160/68 176/78  Pulse:      Temp: 98.8 F (37.1 C)  99.6 F (37.6 C) 98.8 F (37.1 C)  TempSrc:   Oral Oral  Resp:   19 22  Height:      Weight:      SpO2:   95% 100%    Weight change:  Filed Weights   08/07/14 2009  Weight: 90.22 kg (198 lb 14.4 oz)   Body mass index is 30.67 kg/(m^2).   Gen Exam: Awake and alert with clear speech.  Not in any distress Neck: Supple, No JVD.   Chest: B/L Clear.  No rales or rhonchi CVS: S1 S2 Regular, no murmurs.  Abdomen: soft, BS +, non tender, non distended.  Extremities: no edema, lower extremities warm to touch.Both lower legs in bandage Neurologic: Non Focal.   Skin: No Rash.   Wounds: N/A.   Intake/Output from  previous day:  Intake/Output Summary (Last 24 hours) at 08/15/14 1113 Last data filed at 08/15/14 1011  Gross per 24 hour  Intake   1260 ml  Output    250 ml  Net   1010 ml     LAB RESULTS: CBC  Recent Labs Lab 08/10/14 0450 08/11/14 0700 08/12/14 0522 08/13/14 0809 08/14/14 0651  WBC 15.0* 16.4* 18.2* 19.1* 19.7*  HGB 11.2* 9.8* 9.6* 9.9* 9.0*  HCT 33.6* 29.7* 29.1* 29.6* 27.1*  PLT 257 283 285 308 348  MCV 93.6 93.4 92.4 92.2 92.2  MCH 31.2 30.8 30.5 30.8 30.6  MCHC 33.3 33.0 33.0 33.4 33.2  RDW 13.1 13.3 13.6 13.3 13.2    Chemistries   Recent Labs Lab 08/10/14 0450 08/11/14 0700 08/12/14 0522 08/13/14 0809 08/14/14 0651  NA 128* 133* 136 133* 132*  K 4.0 3.4* 3.6 3.4* 3.8  CL 100 99 100 98 96  CO2 23 26 26 24 26   GLUCOSE 159* 158* 112* 164* 178*  BUN 30* 27* 21 23 31*  CREATININE 1.52* 1.54* 1.42* 1.35* 1.69*  CALCIUM 7.9* 7.8* 8.0* 8.2* 8.0*    CBG:  Recent Labs Lab 08/13/14 1654 08/13/14 2215 08/14/14 0914 08/14/14 1710 08/14/14 2231  GLUCAP 178* 166* 178* 222* 212*    GFR Estimated Creatinine Clearance: 41.8 mL/min (by C-G formula based on Cr of 1.69).  Coagulation profile No results for input(s): INR, PROTIME in the last 168 hours.  Cardiac  Enzymes No results for input(s): CKMB, TROPONINI, MYOGLOBIN in the last 168 hours.  Invalid input(s): CK  Invalid input(s): POCBNP No results for input(s): DDIMER in the last 72 hours. No results for input(s): HGBA1C in the last 72 hours. No results for input(s): CHOL, HDL, LDLCALC, TRIG, CHOLHDL, LDLDIRECT in the last 72 hours. No results for input(s): TSH, T4TOTAL, T3FREE, THYROIDAB in the last 72 hours.  Invalid input(s): FREET3 No results for input(s): VITAMINB12, FOLATE, FERRITIN, TIBC, IRON, RETICCTPCT in the last 72 hours. No results for input(s): LIPASE, AMYLASE in the last 72 hours.  Urine Studies No results for input(s): UHGB, CRYS in the last 72 hours.  Invalid input(s): UACOL, UAPR, USPG, UPH, UTP, UGL, UKET, UBIL, UNIT, UROB, ULEU, UEPI, UWBC, URBC, UBAC, CAST, UCOM, BILUA  MICROBIOLOGY: Recent Results (from the past 240 hour(s))  Culture, blood (routine x 2)     Status: None   Collection Time: 08/09/14 12:15 PM  Result Value Ref Range Status   Specimen Description BLOOD RIGHT HAND  Final   Special Requests   Final    BOTTLES DRAWN AEROBIC AND ANAEROBIC 7CCS BOTH BOTTLES   Culture   Final    STAPHYLOCOCCUS AUREUS Note: RIFAMPIN AND GENTAMICIN SHOULD NOT BE USED AS SINGLE DRUGS FOR TREATMENT OF STAPH INFECTIONS. Note: Gram Stain Report Called to,Read Back By and Verified With: Shirlean Kelly 700AM 08/10/14 [place] Performed at [facility]    Report Status 08/12/2014 FINAL  Final   Organism ID, Bacteria STAPHYLOCOCCUS AUREUS  Final      Susceptibility   Staphylococcus aureus - MIC*    CLINDAMYCIN <=0.25 SENSITIVE Sensitive     ERYTHROMYCIN <=0.25 SENSITIVE Sensitive     GENTAMICIN <=0.5 SENSITIVE Sensitive     LEVOFLOXACIN 0.25 SENSITIVE Sensitive     OXACILLIN <=0.25 SENSITIVE Sensitive     PENICILLIN >=0.5 RESISTANT Resistant     RIFAMPIN <=0.5 SENSITIVE Sensitive     TRIMETH/SULFA <=10 SENSITIVE Sensitive     VANCOMYCIN 1 SENSITIVE Sensitive  TETRACYCLINE <=1 SENSITIVE Sensitive     MOXIFLOXACIN <=0.25 SENSITIVE Sensitive     * STAPHYLOCOCCUS AUREUS  Culture, blood (routine x 2)     Status: None   Collection Time: 08/09/14 12:25 PM  Result Value Ref Range Status   Specimen Description BLOOD RIGHT ARM  Final   Special Requests   Final    BOTTLES DRAWN AEROBIC AND ANAEROBIC 10CC BOTH BOTTLES   Culture   Final    STAPHYLOCOCCUS AUREUS Note: SUSCEPTIBILITIES PERFORMED ON PREVIOUS CULTURE WITHIN THE LAST 5 DAYS. Note: Gram Stain Report Called to,Read Back By and Verified With: Shirlean Kelly 700AM 08/10/14 [place] Performed at [facility]    Report Status 08/12/2014 FINAL  Final  Culture, routine-abscess     Status: None   Collection Time: 08/10/14 11:54 AM  Result Value Ref Range Status   Specimen Description SHOULDER LEFT  Final   Special Requests NONE  Final   Gram Stain   Final    ABUNDANT WBC PRESENT, PREDOMINANTLY PMN NO SQUAMOUS EPITHELIAL CELLS SEEN MODERATE GRAM POSITIVE COCCI IN PAIRS IN CLUSTERS Performed at [facility]    Culture   Final    MODERATE STAPHYLOCOCCUS AUREUS Note: RIFAMPIN AND GENTAMICIN SHOULD NOT BE USED AS SINGLE DRUGS FOR TREATMENT OF STAPH INFECTIONS. Performed at [facility]    Report Status 08/13/2014 FINAL  Final   Organism ID, Bacteria STAPHYLOCOCCUS AUREUS  Final      Susceptibility   Staphylococcus aureus - MIC*    CLINDAMYCIN <=0.25 SENSITIVE Sensitive     ERYTHROMYCIN <=0.25 SENSITIVE Sensitive     GENTAMICIN <=0.5 SENSITIVE Sensitive     LEVOFLOXACIN 0.25 SENSITIVE Sensitive     OXACILLIN 0.5 SENSITIVE Sensitive     PENICILLIN >=0.5 RESISTANT Resistant     RIFAMPIN <=0.5 SENSITIVE Sensitive     TRIMETH/SULFA <=10 SENSITIVE Sensitive     VANCOMYCIN 1 SENSITIVE Sensitive     TETRACYCLINE <=1 SENSITIVE Sensitive     MOXIFLOXACIN <=0.25 SENSITIVE Sensitive     * MODERATE STAPHYLOCOCCUS AUREUS  Anaerobic culture     Status: None (Preliminary  result)   Collection Time: 08/10/14 11:54 AM  Result Value Ref Range Status   Specimen Description SHOULDER LEFT  Final   Special Requests NONE  Final   Gram Stain   Final    MODERATE WBC PRESENT, PREDOMINANTLY PMN NO SQUAMOUS EPITHELIAL CELLS SEEN MODERATE GRAM POSITIVE COCCI IN PAIRS IN CLUSTERS Performed at [facility]    Culture   Final    No Anaerobes Isolated; Culture in Progress for 14 DAYS Performed at [facility]    Report Status PENDING  Incomplete  Body fluid culture     Status: None   Collection Time: 08/10/14  5:22 PM  Result Value Ref Range Status   Specimen Description SYNOVIAL JOINT RIGHT KNEE  Final   Special Requests Normal  Final   Gram Stain   Final    ABUNDANT WBC PRESENT,BOTH PMN AND MONONUCLEAR NO ORGANISMS SEEN Performed at [facility]    Culture   Final    FEW STAPHYLOCOCCUS AUREUS Note: RIFAMPIN AND GENTAMICIN SHOULD NOT BE USED AS SINGLE DRUGS FOR TREATMENT OF STAPH INFECTIONS. CRITICAL RESULT CALLED TO, READ BACK BY AND VERIFIED WITH: CINDY FLORES RN 08/13/14 AT 40 AM BY [facility] Performed at [facility]    Report Status 08/14/2014 FINAL  Final   Organism ID, Bacteria STAPHYLOCOCCUS AUREUS  Final      Susceptibility  Staphylococcus aureus - MIC*    CLINDAMYCIN <=0.25 SENSITIVE Sensitive     ERYTHROMYCIN <=0.25 SENSITIVE Sensitive     GENTAMICIN <=0.5 SENSITIVE Sensitive     LEVOFLOXACIN 0.25 SENSITIVE Sensitive     OXACILLIN 0.5 SENSITIVE Sensitive     PENICILLIN >=0.5 RESISTANT Resistant     RIFAMPIN <=0.5 SENSITIVE Sensitive     TRIMETH/SULFA <=10 SENSITIVE Sensitive     VANCOMYCIN <=0.5 SENSITIVE Sensitive     TETRACYCLINE <=1 SENSITIVE Sensitive     MOXIFLOXACIN <=0.25 SENSITIVE Sensitive     * FEW STAPHYLOCOCCUS AUREUS  Surgical pcr screen     Status: Abnormal   Collection Time: 08/10/14  6:42 PM  Result Value Ref Range Status   MRSA, PCR NEGATIVE NEGATIVE Final   Staphylococcus aureus POSITIVE  (A) NEGATIVE Final    Comment:        The Xpert SA Assay (FDA approved for NASAL specimens in patients over 79 years of age), is one component of a comprehensive surveillance program.  Test performance has been validated by [facility] for patients greater than or equal to 68 year old. It is not intended to diagnose infection nor to guide or monitor treatment.   Body fluid culture     Status: None   Collection Time: 08/10/14  9:14 PM  Result Value Ref Range Status   Specimen Description FLUID LEFT AC JOINT  Final   Special Requests VANCO, ROCEPHIN  Final   Gram Stain   Final    FEW WBC PRESENT, PREDOMINANTLY PMN MODERATE GRAM POSITIVE COCCI IN PAIRS IN CLUSTERS Gram Stain Report Called to,Read Back By and Verified With: Gram Stain Report Called to,Read Back By and Verified With: Laurance Flatten RN 08/11/13 9:10AM BY [place] Performed at [facility]    Culture   Final    ABUNDANT STAPHYLOCOCCUS AUREUS Note: RIFAMPIN AND GENTAMICIN SHOULD NOT BE USED AS SINGLE DRUGS FOR TREATMENT OF STAPH INFECTIONS. Performed at [facility]    Report Status 08/13/2014 FINAL  Final   Organism ID, Bacteria STAPHYLOCOCCUS AUREUS  Final      Susceptibility   Staphylococcus aureus - MIC*    CLINDAMYCIN <=0.25 SENSITIVE Sensitive     ERYTHROMYCIN <=0.25 SENSITIVE Sensitive     GENTAMICIN <=0.5 SENSITIVE Sensitive     LEVOFLOXACIN 0.25 SENSITIVE Sensitive     OXACILLIN <=0.25 SENSITIVE Sensitive     PENICILLIN >=0.5 RESISTANT Resistant     RIFAMPIN <=0.5 SENSITIVE Sensitive     TRIMETH/SULFA <=10 SENSITIVE Sensitive     VANCOMYCIN 1 SENSITIVE Sensitive     TETRACYCLINE <=1 SENSITIVE Sensitive     MOXIFLOXACIN <=0.25 SENSITIVE Sensitive     * ABUNDANT STAPHYLOCOCCUS AUREUS  Anaerobic culture     Status: None (Preliminary result)   Collection Time: 08/10/14  9:14 PM  Result Value Ref Range Status   Specimen Description FLUID LEFT AC JOINT  Final   Special Requests VANCO,  ROCEPHIN  Final   Gram Stain   Final    FEW WBC PRESENT, PREDOMINANTLY PMN MODERATE GRAM POSITIVE COCCI IN PAIRS IN CLUSTERS Performed at [facility]    Culture   Final    NO ANAEROBES ISOLATED; CULTURE IN PROGRESS FOR 5 DAYS Performed at [facility]    Report Status PENDING  Incomplete  Culture, blood (routine x 2)     Status: None (Preliminary result)   Collection Time: 08/11/14  7:00 AM  Result Value Ref Range Status   Specimen Description BLOOD  RIGHT ARM  Final   Special Requests BOTTLES DRAWN AEROBIC AND ANAEROBIC 6CC  Final   Culture   Final           BLOOD CULTURE RECEIVED NO GROWTH TO DATE CULTURE WILL BE HELD FOR 5 DAYS BEFORE ISSUING A FINAL NEGATIVE REPORT Performed at [facility]    Report Status PENDING  Incomplete  Culture, blood (routine x 2)     Status: None (Preliminary result)   Collection Time: 08/11/14  7:08 AM  Result Value Ref Range Status   Specimen Description BLOOD LEFT HAND  Final   Special Requests BOTTLES DRAWN AEROBIC AND ANAEROBIC [place]  Final   Culture   Final           BLOOD CULTURE RECEIVED NO GROWTH TO DATE CULTURE WILL BE HELD FOR 5 DAYS BEFORE ISSUING A FINAL NEGATIVE REPORT Performed at [facility]    Report Status PENDING  Incomplete  Culture, Urine     Status: None   Collection Time: 08/12/14  5:47 AM  Result Value Ref Range Status   Specimen Description URINE, RANDOM  Final   Special Requests NONE  Final   Colony Count   Final    3,000 COLONIES/ML Performed at [facility]    Culture   Final    INSIGNIFICANT GROWTH Performed at [facility]    Report Status 08/13/2014 FINAL  Final    RADIOLOGY STUDIES/RESULTS: Dg Chest 2 View  08/07/2014   CLINICAL DATA:  Left shoulder pain for 5 days. No known injury. Initial encounter.  EXAM: CHEST  2 VIEW  COMPARISON:  None.  FINDINGS: The heart size and mediastinal contours are within normal limits. Both lungs are clear. No pneumothorax or  pleural effusion is noted. The visualized skeletal structures are unremarkable.  IMPRESSION: No acute cardiopulmonary abnormality seen.   Electronically Signed   By: Marijo Conception, M.D.   On: 08/07/2014 15:02   Ct Angio Chest Pe W/cm &/or Wo Cm  08/07/2014   CLINICAL DATA:  Tachycardia and hypoxia. Left shoulder pain for 4 days. Renal insufficiency.  EXAM: CT ANGIOGRAPHY CHEST WITH CONTRAST  TECHNIQUE: Multidetector CT imaging of the chest was performed using the standard protocol during bolus administration of intravenous contrast. Multiplanar CT image reconstructions and MIPs were obtained to evaluate the vascular anatomy.  CONTRAST:  153mL OMNIPAQUE IOHEXOL 350 MG/ML SOLN. The patient was pre-medicated with Benadryl and Solu-Medrol due to a history of hives with Omnipaque.  COMPARISON:  08/07/2014; 10/18/2011  FINDINGS: Mediastinum/Nodes: No filling defect is identified in the pulmonary arterial tree to suggest pulmonary embolus. Mild atherosclerotic calcification of the aortic arch without acute aortic abnormality identified. No pathologic thoracic adenopathy. Mild cardiomegaly.  Lungs/Pleura: Secondary pulmonary lobular interstitial accentuation noted particularly at the lung apices. Mild atelectasis dependently in both lower lobes. No airspace opacity identified.  Upper abdomen: Unremarkable  Musculoskeletal: Mid thoracic spondylosis and mild kyphosis.  Review of the MIP images confirms the above findings.  IMPRESSION: 1. Mild cardiomegaly with secondary pulmonary lobular interstitial accentuation compatible with mild interstitial edema. 2. No embolus identified.   Electronically Signed   By: Van Clines M.D.   On: 08/07/2014 17:25   Mr Hand Right W Wo Contrast  08/11/2014   CLINICAL DATA:  RIGHT hand swelling. Septic arthritis. Previous aspirations and debridement for septic arthritis.  EXAM: MRI OF THE RIGHT HAND WITHOUT AND WITH CONTRAST  TECHNIQUE: Multiplanar, multisequence MR imaging was  performed both before and  after administration of intravenous contrast.  CONTRAST:  4mL MULTIHANCE GADOBENATE DIMEGLUMINE 529 MG/ML IV SOLN  COMPARISON:  None.  FINDINGS: Edema is present over the dorsum of the hand without abscess. There is flexor and extensor tenosynovitis in the long finger, which may be septic or reactive. The other flexor and extensor compartments appear normal. No deep soft tissue abscess. Periarticular edema is present at third MCP joint however there is no chondrolysis or subchondral marrow edema identified at the third MCP joint. Small third MCP joint effusion. Septic arthritis is considered unlikely based on the lack of secondary findings. The IP joints appear within normal limits. Small cyst or prominent vascular entry zone is present in the radial aspect of the third metacarpal head.  IMPRESSION: Long finger extensor and flexor tenosynovitis, concerning for septic tenosynovitis. Inflammatory changes at the third MCP joint are likely reactive rather than septic arthritis based on the lack of marrow signal changes and chondrolysis. Cellulitis of the hand.   Electronically Signed   By: Dereck Ligas M.D.   On: 08/11/2014 12:35   Mr Shoulder Left Wo Contrast  08/09/2014   CLINICAL DATA:  Acute onset intractable left shoulder pain 08/03/2014 after are a lifting injury.  EXAM: MRI OF THE LEFT SHOULDER WITHOUT CONTRAST  TECHNIQUE: Multiplanar, multisequence MR imaging of the shoulder was performed. No intravenous contrast was administered.  COMPARISON:  Plain films left shoulder 08/07/2014.  FINDINGS: The study is degraded by patient motion.  Rotator cuff: Supraspinatus worse than infraspinatus tendinopathy without tear is identified.  Muscles:  No atrophy or focal lesion.  Biceps long head:  Intact.  Acromioclavicular Joint: Moderate appearing acromioclavicular osteoarthritis is present. There is fluid within the joint. Intense soft tissue edema is seen about the joint and surrounding  fatty and muscular tissues including the deltoid and superior aspect of the supraspinatus. No focal fluid collection is identified.  Labrum:  Intact.  Bones: As described above. No fracture is identified. The acromion is type 2. There is fluid in the subacromial/subdeltoid bursa.  IMPRESSION: Dominant finding is the abnormal appearance of the acromioclavicular joint highly suspicious for septic joint with surrounding cellulitis and myositis.  Moderate acromioclavicular osteoarthritis.  Mild supraspinatus and infraspinatus tendinopathy without tear.  These results will be called to the ordering clinician or representative by the Radiologist Assistant, and communication documented in the PACS or zVision Dashboard.   Electronically Signed   By: Inge Rise M.D.   On: 08/09/2014 08:48   Ir Fluoro Guide Ndl Plmt / Bx  08/10/2014   CLINICAL DATA:  59 year old female with left shoulder pain and evidence on recent MRI of septic arthritis of acromioclavicular joint. She has been referred for evaluation of aspiration.  EXAM: ULTRASOUND AND FLUOROSCOPIC GUIDED LEFT ACROMIOCLAVICULAR JOINT ASPIRATION.  TECHNIQUE: The procedure, risks, benefits, and alternatives were explained to the patient. Questions regarding the procedure were encouraged and answered. The patient understands and consents to the procedure.  Fluoroscopic image of the left shoulder performed. Ultrasound survey of the shoulder performed. Image was stored and sent to PACs with both modality.  Using fluoroscopy, the skin and subcutaneous tissues overlying the left acromioclavicular joint were generously infiltrated 1% lidocaine for local anesthesia.  We then used ultrasound guidance to advance an 18 gauge needle attached to a syringe into the left acromioclavicular joint fluid. Two separate aspiration were performed. Approximately 1-2 cc of fluid were aspirated.  Samples were sent to the lab for analysis.  Patient tolerated the procedure well and remained  hemodynamically stable throughout.  No complications were encountered and no significant blood loss was encountered.  COMPLICATIONS: None  IMPRESSION: Status post fluoroscopic and ultrasound-guided left AC joint aspiration with sample sent to the lab for analysis.  Signed,  Dulcy Fanny. Earleen Newport, DO  [facility]  [facility]   Electronically Signed   By: Corrie Mckusick D.O.   On: 08/10/2014 12:07   Dg Shoulder Left  08/07/2014   CLINICAL DATA:  59 year old female with left shoulder pain after have the lifting injury on 08/03/2014. Initial encounter.  EXAM: LEFT SHOULDER - 2+ VIEW  COMPARISON:  None.  FINDINGS: Bone mineralization is within normal limits for age. No glenohumeral joint dislocation. Proximal left humerus intact. Left clavicle and scapula appear intact. Negative visible left ribs and lung parenchyma.  IMPRESSION: No acute osseous abnormality identified at the left shoulder.   Electronically Signed   By: Genevie Ann M.D.   On: 08/07/2014 11:11    Oren Binet, MD  [facility] Pager:[phone]  If 7PM-7AM, please contact night-coverage [email or web address] Password [email or web address] 08/15/2014, 11:13 AM   LOS: 8 days

## 2014-08-15 NOTE — Progress Notes (Signed)
Physical Therapy Treatment Patient Details Name: Brandy Houston MRN: [id number] DOB: 03-28-1956 Today's Date: 08/15/2014    History of Present Illness MSSA bacteremia with Polyarticular septic arthrits  s/p I&D of BUE's AC joint, BLE knee joint, and right hand    PT Comments    Progressing as expected.  Still stiff and painful with ROM and gait, but is motivated and self ranges her knees, R wrist and to some extent her shoulders, which are more difficult. Still could benefit from short stay on rehab.    Follow Up Recommendations  CIR     Equipment Recommendations  None recommended by PT    Recommendations for Other Services Rehab consult     Precautions / Restrictions Precautions Precautions: Fall Restrictions RLE Weight Bearing: Weight bearing as tolerated LLE Weight Bearing: Weight bearing as tolerated Other Position/Activity Restrictions: As no clarification given after asking from WB clarification, assuming WBAT on bil UE due to MD wanting active movement all joints    Mobility  Bed Mobility Overal bed mobility: Needs Assistance Bed Mobility: Sidelying to Sit   Sidelying to sit: Min guard       General bed mobility comments: scooted to EOB and up via L elbow with min guard  Transfers Overall transfer level: Needs assistance   Transfers: Sit to/from Stand Sit to Stand: Mod assist;From elevated surface;+2 safety/equipment (with less assist needed)         General transfer comment: cues for hand placement;  Ambulation/Gait Ambulation/Gait assistance: Min assist Ambulation Distance (Feet): 14 Feet Assistive device: Rolling walker (2 wheeled) Gait Pattern/deviations: Step-to pattern     General Gait Details: antalgic and initially effortful gait, improved as her sequencing improved.   Stairs            Wheelchair Mobility    Modified Rankin (Stroke Patients Only)       Balance Overall balance assessment: Needs assistance Sitting-balance  support: No upper extremity supported Sitting balance-Leahy Scale: Good       Standing balance-Leahy Scale: Poor                      Cognition Arousal/Alertness: Awake/alert Behavior During Therapy: WFL for tasks assessed/performed Overall Cognitive Status: Within Functional Limits for tasks assessed                      Exercises General Exercises - Lower Extremity Ankle Circles/Pumps: AROM;Both;10 reps;Supine Heel Slides: Both;10 reps;Supine;AAROM;AROM Other Exercises Other Exercises: bicep/tricep press/ shoulder ROM x10 reps.--graded assist    General Comments General comments (skin integrity, edema, etc.): Pt's AROM in knees and shoulders as well at R hand has improved by pt's self mobilization.      Pertinent Vitals/Pain Pain Assessment: Faces Faces Pain Scale: Hurts even more Pain Location: knees and to a lesser extent shoulders Pain Descriptors / Indicators: Aching;Grimacing;Sore Pain Intervention(s): Limited activity within patient's tolerance;Repositioned    Home Living                      Prior Function            PT Goals (current goals can now be found in the care plan section) Acute Rehab PT Goals Patient Stated Goal: go home, feel better PT Goal Formulation: With patient Time For Goal Achievement: 08/27/14 Potential to Achieve Goals: Good Progress towards PT goals: Progressing toward goals    Frequency  Min 4X/week    PT Plan Current plan remains appropriate  Co-evaluation             End of Session   Activity Tolerance: Patient tolerated treatment well Patient left: with call bell/phone within reach;in bed;with nursing/sitter in room     Time: 1521-1546 PT Time Calculation (min) (ACUTE ONLY): 25 min  Charges:  $Gait Training: 8-22 mins $Therapeutic Activity: 8-22 mins                    G Codes:      Trini Soldo, [name] 08/15/2014, 4:05 PM 08/15/2014  Donnella Sham, [place] [phone]   (pager)

## 2014-08-15 NOTE — Progress Notes (Signed)
Incisions are all c/d/i Exam of shoulders, hand, knees are improved. MSSA - continue abx TEE negative for vegetations Stable from ortho standpoint.   Azucena Cecil, MD [place] 7:51 AM

## 2014-08-15 NOTE — Progress Notes (Signed)
Inpatient Rehabilitation  I met with the patient and informed her that her doctors did not believe she was medically ready for IP Rehab today.  I will follow up tomorrow for possible admission pending medical readiness and  bed availability.  Please call if questions.  [facility] Admissions Coordinator Cell [phone] Office [phone]

## 2014-08-15 NOTE — Progress Notes (Addendum)
[name], female   DOB: [birth year], 59 y.o.   MRN: [id number]         [facility] for Infectious Disease    Date of Admission:  08/07/2014   Total days of antibiotics 7          Principal Problem:   Staphylococcus aureus bacteremia Active Problems:   Septic joint of left shoulder region   Sinus tachycardia   Septic joint of right knee joint   CKD (chronic kidney disease) stage 3, GFR 30-59 ml/min   Septic joint of right hand   SIRS (systemic inflammatory response syndrome)   Benign essential HTN   . antiseptic oral rinse  7 mL Mouth Rinse q12n4p  . aspirin  81 mg Oral Daily  .  ceFAZolin (ANCEF) IV  2 g Intravenous 3 times per day  . chlorhexidine  15 mL Mouth Rinse BID  . cyclobenzaprine  5 mg Oral BID  . famotidine  20 mg Oral BID  . ferrous gluconate  324 mg Oral Q breakfast  . heparin subcutaneous  5,000 Units Subcutaneous 3 times per day  . hydrALAZINE  25 mg Oral TID  . insulin aspart  0-9 Units Subcutaneous TID WC  . nicotine  21 mg Transdermal Daily  . polyethylene glycol  17 g Oral BID  . sodium chloride  3 mL Intravenous Q12H    Subjective: She is feeling a little bit better. Still febrile to 100.7 tmax yesterday. Still has joint tenderness mostly left shoulder. She states her legs feel heavy to move  24hr event: underwent TEE which was negative for vegetations   Allergies  Allergen Reactions  . Compazine [Prochlorperazine] Shortness Of Breath and Swelling    TONGUE SWELLS  . Omnipaque [Iohexol] Hives  . Shellfish-Derived Products Anaphylaxis  . Iodinated Diagnostic Agents Rash    OBJECTIVE: Blood pressure 176/78, pulse 110, temperature 98.8 F (37.1 C), temperature source Oral, resp. rate 22, height 5' 7.5" (1.715 m), weight 198 lb 14.4 oz (90.22 kg), last menstrual period 03/16/2002, SpO2 100 %.   General: she is alert and in no distress easily awaken from sleep Ext: left shoulder tender to palpation, left knee effusion > right  knee, warm to touch. Right forearm palpable cord Skin: no splinter or conjunctival hemorrhages Lungs: clear Cor: regular S1 and S2 with an early 1/6 systolic murmur  Lab Results Lab Results  Component Value Date   WBC 19.7* 08/14/2014   HGB 9.0* 08/14/2014   HCT 27.1* 08/14/2014   MCV 92.2 08/14/2014   PLT 348 08/14/2014    Lab Results  Component Value Date   CREATININE 1.69* 08/14/2014   BUN 31* 08/14/2014   NA 132* 08/14/2014   K 3.8 08/14/2014   CL 96 08/14/2014   CO2 26 08/14/2014    Lab Results  Component Value Date   ALT 16 08/12/2014   AST 27 08/12/2014   ALKPHOS 97 08/12/2014   BILITOT 0.4 08/12/2014     Microbiology: Recent Results (from the past 240 hour(s))  Culture, blood (routine x 2)     Status: None   Collection Time: 08/09/14 12:15 PM  Result Value Ref Range Status   Specimen Description BLOOD RIGHT HAND  Final   Special Requests   Final    BOTTLES DRAWN AEROBIC AND ANAEROBIC 7CCS BOTH BOTTLES   Culture   Final    STAPHYLOCOCCUS AUREUS Note: RIFAMPIN AND GENTAMICIN SHOULD NOT BE USED AS SINGLE DRUGS FOR TREATMENT OF STAPH INFECTIONS. Note:  Gram Stain Report Called to,Read Back By and Verified With: Shirlean Kelly 700AM 08/10/14 [place] Performed at [facility]    Report Status 08/12/2014 FINAL  Final   Organism ID, Bacteria STAPHYLOCOCCUS AUREUS  Final      Susceptibility   Staphylococcus aureus - MIC*    CLINDAMYCIN <=0.25 SENSITIVE Sensitive     ERYTHROMYCIN <=0.25 SENSITIVE Sensitive     GENTAMICIN <=0.5 SENSITIVE Sensitive     LEVOFLOXACIN 0.25 SENSITIVE Sensitive     OXACILLIN <=0.25 SENSITIVE Sensitive     PENICILLIN >=0.5 RESISTANT Resistant     RIFAMPIN <=0.5 SENSITIVE Sensitive     TRIMETH/SULFA <=10 SENSITIVE Sensitive     VANCOMYCIN 1 SENSITIVE Sensitive     TETRACYCLINE <=1 SENSITIVE Sensitive     MOXIFLOXACIN <=0.25 SENSITIVE Sensitive     * STAPHYLOCOCCUS AUREUS  Culture, blood (routine x 2)     Status: None    Collection Time: 08/09/14 12:25 PM  Result Value Ref Range Status   Specimen Description BLOOD RIGHT ARM  Final   Special Requests   Final    BOTTLES DRAWN AEROBIC AND ANAEROBIC 10CC BOTH BOTTLES   Culture   Final    STAPHYLOCOCCUS AUREUS Note: SUSCEPTIBILITIES PERFORMED ON PREVIOUS CULTURE WITHIN THE LAST 5 DAYS. Note: Gram Stain Report Called to,Read Back By and Verified With: Shirlean Kelly 700AM 08/10/14 [place] Performed at [facility]    Report Status 08/12/2014 FINAL  Final  Culture, routine-abscess     Status: None   Collection Time: 08/10/14 11:54 AM  Result Value Ref Range Status   Specimen Description SHOULDER LEFT  Final   Special Requests NONE  Final   Gram Stain   Final    ABUNDANT WBC PRESENT, PREDOMINANTLY PMN NO SQUAMOUS EPITHELIAL CELLS SEEN MODERATE GRAM POSITIVE COCCI IN PAIRS IN CLUSTERS Performed at [facility]    Culture   Final    MODERATE STAPHYLOCOCCUS AUREUS Note: RIFAMPIN AND GENTAMICIN SHOULD NOT BE USED AS SINGLE DRUGS FOR TREATMENT OF STAPH INFECTIONS. Performed at [facility]    Report Status 08/13/2014 FINAL  Final   Organism ID, Bacteria STAPHYLOCOCCUS AUREUS  Final      Susceptibility   Staphylococcus aureus - MIC*    CLINDAMYCIN <=0.25 SENSITIVE Sensitive     ERYTHROMYCIN <=0.25 SENSITIVE Sensitive     GENTAMICIN <=0.5 SENSITIVE Sensitive     LEVOFLOXACIN 0.25 SENSITIVE Sensitive     OXACILLIN 0.5 SENSITIVE Sensitive     PENICILLIN >=0.5 RESISTANT Resistant     RIFAMPIN <=0.5 SENSITIVE Sensitive     TRIMETH/SULFA <=10 SENSITIVE Sensitive     VANCOMYCIN 1 SENSITIVE Sensitive     TETRACYCLINE <=1 SENSITIVE Sensitive     MOXIFLOXACIN <=0.25 SENSITIVE Sensitive     * MODERATE STAPHYLOCOCCUS AUREUS  Anaerobic culture     Status: None (Preliminary result)   Collection Time: 08/10/14 11:54 AM  Result Value Ref Range Status   Specimen Description SHOULDER LEFT  Final   Special Requests NONE  Final   Gram Stain    Final    MODERATE WBC PRESENT, PREDOMINANTLY PMN NO SQUAMOUS EPITHELIAL CELLS SEEN MODERATE GRAM POSITIVE COCCI IN PAIRS IN CLUSTERS Performed at [facility]    Culture   Final    No Anaerobes Isolated; Culture in Progress for 14 DAYS Performed at [facility]    Report Status PENDING  Incomplete  Body fluid culture     Status: None   Collection Time: 08/10/14  5:22 PM  Result Value Ref Range Status   Specimen Description SYNOVIAL JOINT RIGHT KNEE  Final   Special Requests Normal  Final   Gram Stain   Final    ABUNDANT WBC PRESENT,BOTH PMN AND MONONUCLEAR NO ORGANISMS SEEN Performed at [facility]    Culture   Final    FEW STAPHYLOCOCCUS AUREUS Note: RIFAMPIN AND GENTAMICIN SHOULD NOT BE USED AS SINGLE DRUGS FOR TREATMENT OF STAPH INFECTIONS. CRITICAL RESULT CALLED TO, READ BACK BY AND VERIFIED WITH: CINDY FLORES RN 08/13/14 AT 73 AM BY [facility] Performed at [facility]    Report Status 08/14/2014 FINAL  Final   Organism ID, Bacteria STAPHYLOCOCCUS AUREUS  Final      Susceptibility   Staphylococcus aureus - MIC*    CLINDAMYCIN <=0.25 SENSITIVE Sensitive     ERYTHROMYCIN <=0.25 SENSITIVE Sensitive     GENTAMICIN <=0.5 SENSITIVE Sensitive     LEVOFLOXACIN 0.25 SENSITIVE Sensitive     OXACILLIN 0.5 SENSITIVE Sensitive     PENICILLIN >=0.5 RESISTANT Resistant     RIFAMPIN <=0.5 SENSITIVE Sensitive     TRIMETH/SULFA <=10 SENSITIVE Sensitive     VANCOMYCIN <=0.5 SENSITIVE Sensitive     TETRACYCLINE <=1 SENSITIVE Sensitive     MOXIFLOXACIN <=0.25 SENSITIVE Sensitive     * FEW STAPHYLOCOCCUS AUREUS  Surgical pcr screen     Status: Abnormal   Collection Time: 08/10/14  6:42 PM  Result Value Ref Range Status   MRSA, PCR NEGATIVE NEGATIVE Final   Staphylococcus aureus POSITIVE (A) NEGATIVE Final    Comment:        The Xpert SA Assay (FDA approved for NASAL specimens in patients over 41 years of age), is one component of a comprehensive  surveillance program.  Test performance has been validated by [facility] for patients greater than or equal to 82 year old. It is not intended to diagnose infection nor to guide or monitor treatment.   Body fluid culture     Status: None   Collection Time: 08/10/14  9:14 PM  Result Value Ref Range Status   Specimen Description FLUID LEFT AC JOINT  Final   Special Requests VANCO, ROCEPHIN  Final   Gram Stain   Final    FEW WBC PRESENT, PREDOMINANTLY PMN MODERATE GRAM POSITIVE COCCI IN PAIRS IN CLUSTERS Gram Stain Report Called to,Read Back By and Verified With: Gram Stain Report Called to,Read Back By and Verified With: Laurance Flatten RN 08/11/13 9:10AM BY [place] Performed at [facility]    Culture   Final    ABUNDANT STAPHYLOCOCCUS AUREUS Note: RIFAMPIN AND GENTAMICIN SHOULD NOT BE USED AS SINGLE DRUGS FOR TREATMENT OF STAPH INFECTIONS. Performed at [facility]    Report Status 08/13/2014 FINAL  Final   Organism ID, Bacteria STAPHYLOCOCCUS AUREUS  Final      Susceptibility   Staphylococcus aureus - MIC*    CLINDAMYCIN <=0.25 SENSITIVE Sensitive     ERYTHROMYCIN <=0.25 SENSITIVE Sensitive     GENTAMICIN <=0.5 SENSITIVE Sensitive     LEVOFLOXACIN 0.25 SENSITIVE Sensitive     OXACILLIN <=0.25 SENSITIVE Sensitive     PENICILLIN >=0.5 RESISTANT Resistant     RIFAMPIN <=0.5 SENSITIVE Sensitive     TRIMETH/SULFA <=10 SENSITIVE Sensitive     VANCOMYCIN 1 SENSITIVE Sensitive     TETRACYCLINE <=1 SENSITIVE Sensitive     MOXIFLOXACIN <=0.25 SENSITIVE Sensitive     * ABUNDANT STAPHYLOCOCCUS AUREUS  Anaerobic culture     Status: None (  Preliminary result)   Collection Time: 08/10/14  9:14 PM  Result Value Ref Range Status   Specimen Description FLUID LEFT AC JOINT  Final   Special Requests VANCO, ROCEPHIN  Final   Gram Stain   Final    FEW WBC PRESENT, PREDOMINANTLY PMN MODERATE GRAM POSITIVE COCCI IN PAIRS IN CLUSTERS Performed at [facility]     Culture   Final    NO ANAEROBES ISOLATED; CULTURE IN PROGRESS FOR 5 DAYS Performed at [facility]    Report Status PENDING  Incomplete  Culture, blood (routine x 2)     Status: None (Preliminary result)   Collection Time: 08/11/14  7:00 AM  Result Value Ref Range Status   Specimen Description BLOOD RIGHT ARM  Final   Special Requests BOTTLES DRAWN AEROBIC AND ANAEROBIC 6CC  Final   Culture   Final           BLOOD CULTURE RECEIVED NO GROWTH TO DATE CULTURE WILL BE HELD FOR 5 DAYS BEFORE ISSUING A FINAL NEGATIVE REPORT Performed at [facility]    Report Status PENDING  Incomplete  Culture, blood (routine x 2)     Status: None (Preliminary result)   Collection Time: 08/11/14  7:08 AM  Result Value Ref Range Status   Specimen Description BLOOD LEFT HAND  Final   Special Requests BOTTLES DRAWN AEROBIC AND ANAEROBIC [place]  Final   Culture   Final           BLOOD CULTURE RECEIVED NO GROWTH TO DATE CULTURE WILL BE HELD FOR 5 DAYS BEFORE ISSUING A FINAL NEGATIVE REPORT Performed at [facility]    Report Status PENDING  Incomplete  Culture, Urine     Status: None   Collection Time: 08/12/14  5:47 AM  Result Value Ref Range Status   Specimen Description URINE, RANDOM  Final   Special Requests NONE  Final   Colony Count   Final    3,000 COLONIES/ML Performed at [facility]    Culture   Final    INSIGNIFICANT GROWTH Performed at [facility]    Report Status 08/13/2014 FINAL  Final   TEE today: No vegetations seen  Assessment:  MSSA bacteremia complicated by multiple septic joints (bilateral shoulders, right knee, right hand tenosynovitis).  Repeat blood cultures are negative at 72 hours and she has no evidence of endocarditis by TEE but she remains febrile with increasing leukocytosis concerning that we do not have source control for invasive staph infection   Plan: 1. Will change her antibiotics to nafcillin and adjust for renal  insufficiency 2. Ongoing fevers = concern that still don't have good source control. Will Repeat blood cultures today 3. Leukocytosis = will check cbc with diff today 4. Recommend imaging of spine in order to see if any other nidus of infection 5. Acute on ckd = did have slight increase in cr yesterday. Will repeat bmp today 6. Right fore arm palpable cord = concern for dvt ? Septic thrombosis  Carlyle Basques, MD [facility] for [place] [phone] pager   [phone] cell 08/15/2014, 10:26 AM

## 2014-08-16 DIAGNOSIS — M7989 Other specified soft tissue disorders: Secondary | ICD-10-CM

## 2014-08-16 DIAGNOSIS — B1920 Unspecified viral hepatitis C without hepatic coma: Secondary | ICD-10-CM

## 2014-08-16 LAB — RENAL FUNCTION PANEL
Albumin: 1.1 g/dL — ABNORMAL LOW (ref 3.5–5.2)
Anion gap: 11 (ref 5–15)
BUN: 46 mg/dL — ABNORMAL HIGH (ref 6–23)
CO2: 22 mmol/L (ref 19–32)
Calcium: 7.8 mg/dL — ABNORMAL LOW (ref 8.4–10.5)
Chloride: 94 mmol/L — ABNORMAL LOW (ref 96–112)
Creatinine, Ser: 4.12 mg/dL — ABNORMAL HIGH (ref 0.50–1.10)
GFR calc Af Amer: 13 mL/min — ABNORMAL LOW (ref >90)
GFR calc non Af Amer: 11 mL/min — ABNORMAL LOW (ref >90)
Glucose, Bld: 205 mg/dL — ABNORMAL HIGH (ref 70–99)
Phosphorus: 5.9 mg/dL — ABNORMAL HIGH (ref 2.3–4.6)
Potassium: 3.5 mmol/L (ref 3.5–5.1)
Sodium: 127 mmol/L — ABNORMAL LOW (ref 135–145)

## 2014-08-16 LAB — URINALYSIS, ROUTINE W REFLEX MICROSCOPIC
Glucose, UA: 100 mg/dL — AB
Ketones, ur: 15 mg/dL — AB
Nitrite: NEGATIVE
Protein, ur: >300 mg/dL — AB
Specific Gravity, Urine: 1.028 (ref 1.005–1.030)
Urobilinogen, UA: 0.2 mg/dL (ref 0.0–1.0)
pH: 5 (ref 5.0–8.0)

## 2014-08-16 LAB — CBC
HCT: 24.1 % — ABNORMAL LOW (ref 36.0–46.0)
Hemoglobin: 8 g/dL — ABNORMAL LOW (ref 12.0–15.0)
MCH: 29.7 pg (ref 26.0–34.0)
MCHC: 33.2 g/dL (ref 30.0–36.0)
MCV: 89.6 fL (ref 78.0–100.0)
Platelets: 374 10*3/uL (ref 150–400)
RBC: 2.69 MIL/uL — ABNORMAL LOW (ref 3.87–5.11)
RDW: 13.2 % (ref 11.5–15.5)
WBC: 12.4 10*3/uL — ABNORMAL HIGH (ref 4.0–10.5)

## 2014-08-16 LAB — CREATININE, URINE, RANDOM: Creatinine, Urine: 192.37 mg/dL

## 2014-08-16 LAB — URINE MICROSCOPIC-ADD ON

## 2014-08-16 LAB — GLUCOSE, CAPILLARY
Glucose-Capillary: 191 mg/dL — ABNORMAL HIGH (ref 70–99)
Glucose-Capillary: 157 mg/dL — ABNORMAL HIGH (ref 70–99)
Glucose-Capillary: 210 mg/dL — ABNORMAL HIGH (ref 70–99)
Glucose-Capillary: 132 mg/dL — ABNORMAL HIGH (ref 70–99)

## 2014-08-16 LAB — CK: Total CK: 45 U/L (ref 7–177)

## 2014-08-16 LAB — SODIUM, URINE, RANDOM: Sodium, Ur: 13 mmol/L

## 2014-08-16 MED ORDER — CEFAZOLIN SODIUM-DEXTROSE 2-3 GM-% IV SOLR
2.0000 g | Freq: Two times a day (BID) | INTRAVENOUS | Status: DC
  Administered 2014-08-16 – 2014-08-17 (×3): 2 g via INTRAVENOUS
  Filled 2014-08-16 (×5): qty 50

## 2014-08-16 MED ORDER — MAGIC MOUTHWASH W/LIDOCAINE
5.0000 mL | Freq: Three times a day (TID) | ORAL | Status: DC | PRN
  Administered 2014-08-17: 5 mL via ORAL
  Filled 2014-08-16 (×3): qty 5

## 2014-08-16 NOTE — Progress Notes (Signed)
[facility] PROGRESS NOTE  Brandy Houston [id number] DOB: 23-May-1956 DOA: 08/07/2014 PCP: Chari Manning, NP  Assessment/Plan: MSSA bacteremia with sepsis and Polyarticular septic arthrits:Initally admitted with acute left shoulder pain, further work up with a MRI of left shoulder demonstrated a septic AC joint.Subsequent blood cultures positive for MSSA. ID and Orthopedics were consulted. Blood cultures on 2/24 was also positive for MSSA. Patient meanwhile started having pain/swelling of her B/L knee joints, Right should joint as well. Further workup demonstarted septic arthritis of these joints.Patient subsequently underwent irrigation and debridement of Right knee, left knee, Right AC & left AC joints, and Irrigation and debridement of right hand flexor tendon sheath. Underwent TEE on 2/29 that was negative. Infectious disease recently recommended switching from Ancef to nafcillin. With worsening renal function, ID has changed abx back to ancef. Recommendations for spine imaging to r/o spine involvement    Anemia:secondary to acute illness/CKD. Hgb stable overnight but remains low. Normocytic picture.   DM-2:CBG's controlled with SSI. Had stopped Glipizide with plans to resume on discharge. A1c 8.4.   HTN: Overall stable with hydralazine and amlodpine   Acute on CKD Stage 3: Cr trending up over the past 48hrs. Concerns for possible nephrotic syndrome. UA with >300 protein with increasing ankle edema in the setting of HCV and active staph infection. Have consulted Nephrology. Appreciate input. Other considerations for AIN from abx, vanc toxicity, cryoglobulinemia.  Possible need for renal biopsy if still no urine output.    Tobacco Abuse:transdermal nicotine, was counseled  Code Status: Full Family Communication: Pt in room  Disposition Plan: Pending   Consultants:  Nephrology  ID  CIR  Procedures:    Antibiotics:  Nafcillin 3/1>>>3/2  Ancef 2/29>>>3/1, resumed  3/2  HPI/Subjective: Reported increasing B ankle swelling. Denies sob  Objective: Filed Vitals:   08/15/14 0615 08/15/14 1343 08/15/14 2113 08/16/14 0649  BP: 176/78 135/63 146/62 135/65  Pulse:  88 99 91  Temp: 98.8 F (37.1 C) 98.3 F (36.8 C) 98.7 F (37.1 C) 99.1 F (37.3 C)  TempSrc: Oral Oral Oral Oral  Resp: 22 20 20 20   Height:      Weight:      SpO2: 100% 95% 95% 97%    Intake/Output Summary (Last 24 hours) at 08/16/14 1525 Last data filed at 08/16/14 1510  Gross per 24 hour  Intake   1750 ml  Output    100 ml  Net   1650 ml   Filed Weights   08/07/14 2009  Weight: 90.22 kg (198 lb 14.4 oz)    Exam:   General:  Awake, in nad  Cardiovascular: regular, s1, s2  Respiratory: normal resp effort, no wheezing  Abdomen: soft,nondistended  Musculoskeletal: perfused, B pitting LE edema   Data Reviewed: Basic Metabolic Panel:  Recent Labs Lab 08/12/14 0522 08/13/14 0809 08/14/14 0651 08/15/14 1511 08/16/14 0500  NA 136 133* 132* 127* 127*  K 3.6 3.4* 3.8 3.4* 3.5  CL 100 98 96 96 94*  CO2 26 24 26 24 22   GLUCOSE 112* 164* 178* 217* 205*  BUN 21 23 31* 41* 46*  CREATININE 1.42* 1.35* 1.69* 3.11* 4.12*  CALCIUM 8.0* 8.2* 8.0* 7.9* 7.8*  PHOS  --   --   --   --  5.9*   Liver Function Tests:  Recent Labs Lab 08/12/14 0522 08/16/14 0500  AST 27  --   ALT 16  --   ALKPHOS 97  --   BILITOT 0.4  --   PROT  5.7*  --   ALBUMIN 1.1* 1.1*   No results for input(s): LIPASE, AMYLASE in the last 168 hours. No results for input(s): AMMONIA in the last 168 hours. CBC:  Recent Labs Lab 08/12/14 0522 08/13/14 0809 08/14/14 0651 08/15/14 1511 08/16/14 0550  WBC 18.2* 19.1* 19.7* 13.2* 12.4*  NEUTROABS  --   --   --  10.6*  --   HGB 9.6* 9.9* 9.0* 8.1* 8.0*  HCT 29.1* 29.6* 27.1* 24.0* 24.1*  MCV 92.4 92.2 92.2 91.3 89.6  PLT 285 308 348 349 374   Cardiac Enzymes:  Recent Labs Lab 08/16/14 1330  CKTOTAL 45   BNP (last 3 results) No  results for input(s): BNP in the last 8760 hours.  ProBNP (last 3 results) No results for input(s): PROBNP in the last 8760 hours.  CBG:  Recent Labs Lab 08/15/14 1727 08/15/14 2100 08/15/14 2115 08/16/14 0758 08/16/14 1202  GLUCAP 195* 203* 196* 191* 157*    Recent Results (from the past 240 hour(s))  Culture, blood (routine x 2)     Status: None   Collection Time: 08/09/14 12:15 PM  Result Value Ref Range Status   Specimen Description BLOOD RIGHT HAND  Final   Special Requests   Final    BOTTLES DRAWN AEROBIC AND ANAEROBIC 7CCS BOTH BOTTLES   Culture   Final    STAPHYLOCOCCUS AUREUS Note: RIFAMPIN AND GENTAMICIN SHOULD NOT BE USED AS SINGLE DRUGS FOR TREATMENT OF STAPH INFECTIONS. Note: Gram Stain Report Called to,Read Back By and Verified With: Shirlean Kelly 700AM 08/10/14 [place] Performed at [facility]    Report Status 08/12/2014 FINAL  Final   Organism ID, Bacteria STAPHYLOCOCCUS AUREUS  Final      Susceptibility   Staphylococcus aureus - MIC*    CLINDAMYCIN <=0.25 SENSITIVE Sensitive     ERYTHROMYCIN <=0.25 SENSITIVE Sensitive     GENTAMICIN <=0.5 SENSITIVE Sensitive     LEVOFLOXACIN 0.25 SENSITIVE Sensitive     OXACILLIN <=0.25 SENSITIVE Sensitive     PENICILLIN >=0.5 RESISTANT Resistant     RIFAMPIN <=0.5 SENSITIVE Sensitive     TRIMETH/SULFA <=10 SENSITIVE Sensitive     VANCOMYCIN 1 SENSITIVE Sensitive     TETRACYCLINE <=1 SENSITIVE Sensitive     MOXIFLOXACIN <=0.25 SENSITIVE Sensitive     * STAPHYLOCOCCUS AUREUS  Culture, blood (routine x 2)     Status: None   Collection Time: 08/09/14 12:25 PM  Result Value Ref Range Status   Specimen Description BLOOD RIGHT ARM  Final   Special Requests   Final    BOTTLES DRAWN AEROBIC AND ANAEROBIC 10CC BOTH BOTTLES   Culture   Final    STAPHYLOCOCCUS AUREUS Note: SUSCEPTIBILITIES PERFORMED ON PREVIOUS CULTURE WITHIN THE LAST 5 DAYS. Note: Gram Stain Report Called to,Read Back By and Verified With:  Shirlean Kelly 700AM 08/10/14 [place] Performed at [facility]    Report Status 08/12/2014 FINAL  Final  Culture, routine-abscess     Status: None   Collection Time: 08/10/14 11:54 AM  Result Value Ref Range Status   Specimen Description SHOULDER LEFT  Final   Special Requests NONE  Final   Gram Stain   Final    ABUNDANT WBC PRESENT, PREDOMINANTLY PMN NO SQUAMOUS EPITHELIAL CELLS SEEN MODERATE GRAM POSITIVE COCCI IN PAIRS IN CLUSTERS Performed at [facility]    Culture   Final    MODERATE STAPHYLOCOCCUS AUREUS Note: RIFAMPIN AND GENTAMICIN SHOULD NOT BE USED AS SINGLE DRUGS FOR  TREATMENT OF STAPH INFECTIONS. Performed at [facility]    Report Status 08/13/2014 FINAL  Final   Organism ID, Bacteria STAPHYLOCOCCUS AUREUS  Final      Susceptibility   Staphylococcus aureus - MIC*    CLINDAMYCIN <=0.25 SENSITIVE Sensitive     ERYTHROMYCIN <=0.25 SENSITIVE Sensitive     GENTAMICIN <=0.5 SENSITIVE Sensitive     LEVOFLOXACIN 0.25 SENSITIVE Sensitive     OXACILLIN 0.5 SENSITIVE Sensitive     PENICILLIN >=0.5 RESISTANT Resistant     RIFAMPIN <=0.5 SENSITIVE Sensitive     TRIMETH/SULFA <=10 SENSITIVE Sensitive     VANCOMYCIN 1 SENSITIVE Sensitive     TETRACYCLINE <=1 SENSITIVE Sensitive     MOXIFLOXACIN <=0.25 SENSITIVE Sensitive     * MODERATE STAPHYLOCOCCUS AUREUS  Anaerobic culture     Status: None (Preliminary result)   Collection Time: 08/10/14 11:54 AM  Result Value Ref Range Status   Specimen Description SHOULDER LEFT  Final   Special Requests NONE  Final   Gram Stain   Final    MODERATE WBC PRESENT, PREDOMINANTLY PMN NO SQUAMOUS EPITHELIAL CELLS SEEN MODERATE GRAM POSITIVE COCCI IN PAIRS IN CLUSTERS Performed at [facility]    Culture   Final    No Anaerobes Isolated; Culture in Progress for 14 DAYS Performed at [facility]    Report Status PENDING  Incomplete  Body fluid culture     Status: None   Collection Time:  08/10/14  5:22 PM  Result Value Ref Range Status   Specimen Description SYNOVIAL JOINT RIGHT KNEE  Final   Special Requests Normal  Final   Gram Stain   Final    ABUNDANT WBC PRESENT,BOTH PMN AND MONONUCLEAR NO ORGANISMS SEEN Performed at [facility]    Culture   Final    FEW STAPHYLOCOCCUS AUREUS Note: RIFAMPIN AND GENTAMICIN SHOULD NOT BE USED AS SINGLE DRUGS FOR TREATMENT OF STAPH INFECTIONS. CRITICAL RESULT CALLED TO, READ BACK BY AND VERIFIED WITH: CINDY FLORES RN 08/13/14 AT 76 AM BY [facility] Performed at [facility]    Report Status 08/14/2014 FINAL  Final   Organism ID, Bacteria STAPHYLOCOCCUS AUREUS  Final      Susceptibility   Staphylococcus aureus - MIC*    CLINDAMYCIN <=0.25 SENSITIVE Sensitive     ERYTHROMYCIN <=0.25 SENSITIVE Sensitive     GENTAMICIN <=0.5 SENSITIVE Sensitive     LEVOFLOXACIN 0.25 SENSITIVE Sensitive     OXACILLIN 0.5 SENSITIVE Sensitive     PENICILLIN >=0.5 RESISTANT Resistant     RIFAMPIN <=0.5 SENSITIVE Sensitive     TRIMETH/SULFA <=10 SENSITIVE Sensitive     VANCOMYCIN <=0.5 SENSITIVE Sensitive     TETRACYCLINE <=1 SENSITIVE Sensitive     MOXIFLOXACIN <=0.25 SENSITIVE Sensitive     * FEW STAPHYLOCOCCUS AUREUS  Surgical pcr screen     Status: Abnormal   Collection Time: 08/10/14  6:42 PM  Result Value Ref Range Status   MRSA, PCR NEGATIVE NEGATIVE Final   Staphylococcus aureus POSITIVE (A) NEGATIVE Final    Comment:        The Xpert SA Assay (FDA approved for NASAL specimens in patients over 5 years of age), is one component of a comprehensive surveillance program.  Test performance has been validated by [facility] for patients greater than or equal to 29 year old. It is not intended to diagnose infection nor to guide or monitor treatment.   Body fluid culture     Status: None  Collection Time: 08/10/14  9:14 PM  Result Value Ref Range Status   Specimen Description FLUID LEFT AC JOINT  Final   Special Requests  VANCO, ROCEPHIN  Final   Gram Stain   Final    FEW WBC PRESENT, PREDOMINANTLY PMN MODERATE GRAM POSITIVE COCCI IN PAIRS IN CLUSTERS Gram Stain Report Called to,Read Back By and Verified With: Gram Stain Report Called to,Read Back By and Verified With: Laurance Flatten RN 08/11/13 9:10AM BY [place] Performed at [facility]    Culture   Final    ABUNDANT STAPHYLOCOCCUS AUREUS Note: RIFAMPIN AND GENTAMICIN SHOULD NOT BE USED AS SINGLE DRUGS FOR TREATMENT OF STAPH INFECTIONS. Performed at [facility]    Report Status 08/13/2014 FINAL  Final   Organism ID, Bacteria STAPHYLOCOCCUS AUREUS  Final      Susceptibility   Staphylococcus aureus - MIC*    CLINDAMYCIN <=0.25 SENSITIVE Sensitive     ERYTHROMYCIN <=0.25 SENSITIVE Sensitive     GENTAMICIN <=0.5 SENSITIVE Sensitive     LEVOFLOXACIN 0.25 SENSITIVE Sensitive     OXACILLIN <=0.25 SENSITIVE Sensitive     PENICILLIN >=0.5 RESISTANT Resistant     RIFAMPIN <=0.5 SENSITIVE Sensitive     TRIMETH/SULFA <=10 SENSITIVE Sensitive     VANCOMYCIN 1 SENSITIVE Sensitive     TETRACYCLINE <=1 SENSITIVE Sensitive     MOXIFLOXACIN <=0.25 SENSITIVE Sensitive     * ABUNDANT STAPHYLOCOCCUS AUREUS  Anaerobic culture     Status: None   Collection Time: 08/10/14  9:14 PM  Result Value Ref Range Status   Specimen Description FLUID LEFT AC JOINT  Final   Special Requests VANCO, ROCEPHIN  Final   Gram Stain   Final    FEW WBC PRESENT, PREDOMINANTLY PMN MODERATE GRAM POSITIVE COCCI IN PAIRS IN CLUSTERS Performed at [facility]    Culture   Final    NO ANAEROBES ISOLATED Performed at [facility]    Report Status 08/15/2014 FINAL  Final  Culture, blood (routine x 2)     Status: None (Preliminary result)   Collection Time: 08/11/14  7:00 AM  Result Value Ref Range Status   Specimen Description BLOOD RIGHT ARM  Final   Special Requests BOTTLES DRAWN AEROBIC AND ANAEROBIC 6CC  Final   Culture   Final           BLOOD  CULTURE RECEIVED NO GROWTH TO DATE CULTURE WILL BE HELD FOR 5 DAYS BEFORE ISSUING A FINAL NEGATIVE REPORT Performed at [facility]    Report Status PENDING  Incomplete  Culture, blood (routine x 2)     Status: None (Preliminary result)   Collection Time: 08/11/14  7:08 AM  Result Value Ref Range Status   Specimen Description BLOOD LEFT HAND  Final   Special Requests BOTTLES DRAWN AEROBIC AND ANAEROBIC [place]  Final   Culture   Final           BLOOD CULTURE RECEIVED NO GROWTH TO DATE CULTURE WILL BE HELD FOR 5 DAYS BEFORE ISSUING A FINAL NEGATIVE REPORT Performed at [facility]    Report Status PENDING  Incomplete  Culture, Urine     Status: None   Collection Time: 08/12/14  5:47 AM  Result Value Ref Range Status   Specimen Description URINE, RANDOM  Final   Special Requests NONE  Final   Colony Count   Final    3,000 COLONIES/ML Performed at [facility]    Culture   Final  INSIGNIFICANT GROWTH Performed at [facility]    Report Status 08/13/2014 FINAL  Final  Culture, blood (routine x 2)     Status: None (Preliminary result)   Collection Time: 08/15/14  1:15 PM  Result Value Ref Range Status   Specimen Description BLOOD RIGHT HAND  Final   Special Requests BOTTLES DRAWN AEROBIC ONLY 3CC  Final   Culture   Final           BLOOD CULTURE RECEIVED NO GROWTH TO DATE CULTURE WILL BE HELD FOR 5 DAYS BEFORE ISSUING A FINAL NEGATIVE REPORT Performed at [facility]    Report Status PENDING  Incomplete  Culture, blood (routine x 2)     Status: None (Preliminary result)   Collection Time: 08/15/14  3:11 PM  Result Value Ref Range Status   Specimen Description BLOOD RIGHT HAND  Final   Special Requests   Final    BOTTLES DRAWN AEROBIC AND ANAEROBIC BLUE 10CC RED 5CC   Culture   Final           BLOOD CULTURE RECEIVED NO GROWTH TO DATE CULTURE WILL BE HELD FOR 5 DAYS BEFORE ISSUING A FINAL NEGATIVE REPORT Performed at [facility]     Report Status PENDING  Incomplete     Studies: US Renal  08/15/2014   CLINICAL DATA:  Acute renal failure.  EXAM: RENAL/URINARY TRACT ULTRASOUND COMPLETE  COMPARISON:  None.  FINDINGS: Right Kidney:  Length: 11.6 cm. Increased echogenicity of the parenchyma consistent with renal medical disease. No hydronephrosis or mass lesion or thinning of the renal cortex.  Left Kidney:  Length: 11.9 cm. Increased echogenicity of the renal parenchyma with no thinning of the cortex and no hydronephrosis or mass.  Bladder:  Normal.  Incidental note is made of small bilateral pleural effusions.  IMPRESSION: Increased echogenicity of the renal parenchyma both kidneys consistent with renal medical disease. Small bilateral pleural effusions.   Electronically Signed   By: Lorriane Shire M.D.   On: 08/15/2014 20:08    Scheduled Meds: . amLODipine  5 mg Oral Daily  . antiseptic oral rinse  7 mL Mouth Rinse q12n4p  . aspirin  81 mg Oral Daily  .  ceFAZolin (ANCEF) IV  2 g Intravenous Q12H  . chlorhexidine  15 mL Mouth Rinse BID  . cyclobenzaprine  5 mg Oral BID  . famotidine  20 mg Oral Daily  . ferrous gluconate  324 mg Oral Q breakfast  . heparin subcutaneous  5,000 Units Subcutaneous 3 times per day  . hydrALAZINE  25 mg Oral TID  . insulin aspart  0-9 Units Subcutaneous TID WC  . nicotine  21 mg Transdermal Daily  . polyethylene glycol  17 g Oral BID  . sodium chloride  3 mL Intravenous Q12H   Continuous Infusions: . sodium chloride 100 mL/hr at 08/16/14 [id number]    Principal Problem:   Staphylococcus aureus bacteremia Active Problems:   Septic joint of left shoulder region   Sinus tachycardia   Septic joint of right knee joint   CKD (chronic kidney disease) stage 3, GFR 30-59 ml/min   Septic joint of right hand   SIRS (systemic inflammatory response syndrome)   Benign essential HTN    CHIU, STEPHEN K  [facility] Pager [phone]. If 7PM-7AM, please contact night-coverage at  [email or web address], password [email or web address] 08/16/2014, 3:25 PM  LOS: 9 days

## 2014-08-16 NOTE — Progress Notes (Signed)
Physical Therapy Treatment Patient Details Name: Brandy Houston MRN: [id number] DOB: 17-Jun-1955 Today's Date: 08/16/2014    History of Present Illness MSSA bacteremia with Polyarticular septic arthrits  s/p I&D of BUE's AC joint, BLE knee joint, and right hand    PT Comments    Pt progressing towards physical therapy goals. Was able to improve ambulation distance with 1 seated rest break for fatigue. Overall good rehab effort. Will continue to follow and progress as able per POC.   Follow Up Recommendations  CIR     Equipment Recommendations  None recommended by PT    Recommendations for Other Services Rehab consult     Precautions / Restrictions Precautions Precautions: Fall Restrictions Weight Bearing Restrictions: Yes RLE Weight Bearing: Weight bearing as tolerated LLE Weight Bearing: Weight bearing as tolerated Other Position/Activity Restrictions: As no clarification given after asking from WB clarification, assuming WBAT on bil UE due to MD wanting active movement all joints    Mobility  Bed Mobility Overal bed mobility: Needs Assistance Bed Mobility: Supine to Sit     Supine to sit: Min assist     General bed mobility comments: Assist for trunk elevation to full sitting position as well as to gain and maintain seated balance initially.   Transfers Overall transfer level: Needs assistance Equipment used: Rolling walker (2 wheeled) Transfers: Sit to/from Stand Sit to Stand: Min assist;+2 physical assistance;+2 safety/equipment         General transfer comment: VC's for hand placement on seated surface for safety. Pt was able to power-up to full standing with +2 assist.   Ambulation/Gait Ambulation/Gait assistance: Min assist Ambulation Distance (Feet): 45 Feet Assistive device: Rolling walker (2 wheeled) Gait Pattern/deviations: Step-to pattern;Decreased stride length;Trunk flexed Gait velocity: Decreased Gait velocity interpretation: Below normal speed for  age/gender General Gait Details: Antalgic and initially effortful gait, improved as her sequencing improved. 1 seated rest break due to pain and fatigue.    Stairs            Wheelchair Mobility    Modified Rankin (Stroke Patients Only)       Balance Overall balance assessment: Needs assistance Sitting-balance support: Feet supported;No upper extremity supported Sitting balance-Leahy Scale: Good     Standing balance support: Bilateral upper extremity supported;During functional activity Standing balance-Leahy Scale: Poor                      Cognition Arousal/Alertness: Awake/alert Behavior During Therapy: WFL for tasks assessed/performed Overall Cognitive Status: Within Functional Limits for tasks assessed                      Exercises      General Comments        Pertinent Vitals/Pain Pain Assessment: Faces Faces Pain Scale: Hurts even more Pain Location: knees and to a lesser extent shoulders Pain Descriptors / Indicators: Aching;Grimacing;Sore Pain Intervention(s): Limited activity within patient's tolerance;Monitored during session;Repositioned    Home Living                      Prior Function            PT Goals (current goals can now be found in the care plan section) Acute Rehab PT Goals Patient Stated Goal: go home, feel better PT Goal Formulation: With patient Time For Goal Achievement: 08/27/14 Potential to Achieve Goals: Good Progress towards PT goals: Progressing toward goals    Frequency  Min 4X/week  PT Plan Current plan remains appropriate    Co-evaluation             End of Session Equipment Utilized During Treatment: Gait belt Activity Tolerance: Patient tolerated treatment well Patient left: with call bell/phone within reach;in bed;with nursing/sitter in room     Time: 1330-1400 PT Time Calculation (min) (ACUTE ONLY): 30 min  Charges:  $Gait Training: 8-22 mins $Therapeutic Activity:  8-22 mins                    G Codes:      Rolinda Roan 08/21/14, 3:02 PM  Rolinda Roan, PT, DPT Acute Rehabilitation Services Pager: [phone]

## 2014-08-16 NOTE — Progress Notes (Signed)
[name], female   DOB: 07-28-1955, 59 y.o.   MRN: [id number]         [place]    Date of Admission:  08/07/2014   Total days of antibiotics 7          Principal Problem:   Staphylococcus aureus bacteremia Active Problems:   Septic joint of left shoulder region   Sinus tachycardia   Septic joint of right knee joint   CKD (chronic kidney disease) stage 3, GFR 30-59 ml/min   Septic joint of right hand   SIRS (systemic inflammatory response syndrome)   Benign essential HTN   . amLODipine  5 mg Oral Daily  . antiseptic oral rinse  7 mL Mouth Rinse q12n4p  . aspirin  81 mg Oral Daily  . chlorhexidine  15 mL Mouth Rinse BID  . cyclobenzaprine  5 mg Oral BID  . famotidine  20 mg Oral Daily  . ferrous gluconate  324 mg Oral Q breakfast  . heparin subcutaneous  5,000 Units Subcutaneous 3 times per day  . hydrALAZINE  25 mg Oral TID  . insulin aspart  0-9 Units Subcutaneous TID WC  . nicotine  21 mg Transdermal Daily  . polyethylene glycol  17 g Oral BID  . sodium chloride  3 mL Intravenous Q12H    Subjective: She had ultrasound of arm that ruled out DVT in right forearm. Labs reveal AKI. Afebrile. Having pain to left knee    Allergies  Allergen Reactions  . Compazine [Prochlorperazine] Shortness Of Breath and Swelling    TONGUE SWELLS  . Omnipaque [Iohexol] Hives  . Shellfish-Derived Products Anaphylaxis  . Iodinated Diagnostic Agents Rash    OBJECTIVE: Blood pressure 135/65, pulse 91, temperature 99.1 F (37.3 C), temperature source Oral, resp. rate 20, height 5' 7.5" (1.715 m), weight 198 lb 14.4 oz (90.22 kg), last menstrual period 03/16/2002, SpO2 97 %.   General: she is alert and in no distress easily awaken from sleep Ext: left shoulder tender to palpation, left knee effusion > right knee, warm to touch adn exquisitely tender. Right forearm palpable cord Skin: no splinter or conjunctival hemorrhages Lungs: clear Cor:  regular S1 and S2 with an early 1/6 systolic murmur  Lab Results Lab Results  Component Value Date   WBC 12.4* 08/16/2014   HGB 8.0* 08/16/2014   HCT 24.1* 08/16/2014   MCV 89.6 08/16/2014   PLT 374 08/16/2014    Lab Results  Component Value Date   CREATININE 4.12* 08/16/2014   BUN 46* 08/16/2014   NA 127* 08/16/2014   K 3.5 08/16/2014   CL 94* 08/16/2014   CO2 22 08/16/2014    Lab Results  Component Value Date   ALT 16 08/12/2014   AST 27 08/12/2014   ALKPHOS 97 08/12/2014   BILITOT 0.4 08/12/2014     Microbiology: Recent Results (from the past 240 hour(s))  Culture, blood (routine x 2)     Status: None   Collection Time: 08/09/14 12:15 PM  Result Value Ref Range Status   Specimen Description BLOOD RIGHT HAND  Final   Special Requests   Final    BOTTLES DRAWN AEROBIC AND ANAEROBIC 7CCS BOTH BOTTLES   Culture   Final    STAPHYLOCOCCUS AUREUS Note: RIFAMPIN AND GENTAMICIN SHOULD NOT BE USED AS SINGLE DRUGS FOR TREATMENT OF STAPH INFECTIONS. Note: Gram Stain Report Called to,Read Back By and Verified With: Shirlean Kelly 700AM 08/10/14 [place] Performed at [facility]    Report Status 08/12/2014 FINAL  Final   Organism ID, Bacteria STAPHYLOCOCCUS AUREUS  Final      Susceptibility   Staphylococcus aureus - MIC*    CLINDAMYCIN <=0.25 SENSITIVE Sensitive     ERYTHROMYCIN <=0.25 SENSITIVE Sensitive     GENTAMICIN <=0.5 SENSITIVE Sensitive     LEVOFLOXACIN 0.25 SENSITIVE Sensitive     OXACILLIN <=0.25 SENSITIVE Sensitive     PENICILLIN >=0.5 RESISTANT Resistant     RIFAMPIN <=0.5 SENSITIVE Sensitive     TRIMETH/SULFA <=10 SENSITIVE Sensitive     VANCOMYCIN 1 SENSITIVE Sensitive     TETRACYCLINE <=1 SENSITIVE Sensitive     MOXIFLOXACIN <=0.25 SENSITIVE Sensitive     * STAPHYLOCOCCUS AUREUS  Culture, blood (routine x 2)     Status: None   Collection Time: 08/09/14 12:25 PM  Result Value Ref Range Status   Specimen Description BLOOD RIGHT ARM  Final    Special Requests   Final    BOTTLES DRAWN AEROBIC AND ANAEROBIC 10CC BOTH BOTTLES   Culture   Final    STAPHYLOCOCCUS AUREUS Note: SUSCEPTIBILITIES PERFORMED ON PREVIOUS CULTURE WITHIN THE LAST 5 DAYS. Note: Gram Stain Report Called to,Read Back By and Verified With: Shirlean Kelly 700AM 08/10/14 [place] Performed at [facility]    Report Status 08/12/2014 FINAL  Final  Culture, routine-abscess     Status: None   Collection Time: 08/10/14 11:54 AM  Result Value Ref Range Status   Specimen Description SHOULDER LEFT  Final   Special Requests NONE  Final   Gram Stain   Final    ABUNDANT WBC PRESENT, PREDOMINANTLY PMN NO SQUAMOUS EPITHELIAL CELLS SEEN MODERATE GRAM POSITIVE COCCI IN PAIRS IN CLUSTERS Performed at [facility]    Culture   Final    MODERATE STAPHYLOCOCCUS AUREUS Note: RIFAMPIN AND GENTAMICIN SHOULD NOT BE USED AS SINGLE DRUGS FOR TREATMENT OF STAPH INFECTIONS. Performed at [facility]    Report Status 08/13/2014 FINAL  Final   Organism ID, Bacteria STAPHYLOCOCCUS AUREUS  Final      Susceptibility   Staphylococcus aureus - MIC*    CLINDAMYCIN <=0.25 SENSITIVE Sensitive     ERYTHROMYCIN <=0.25 SENSITIVE Sensitive     GENTAMICIN <=0.5 SENSITIVE Sensitive     LEVOFLOXACIN 0.25 SENSITIVE Sensitive     OXACILLIN 0.5 SENSITIVE Sensitive     PENICILLIN >=0.5 RESISTANT Resistant     RIFAMPIN <=0.5 SENSITIVE Sensitive     TRIMETH/SULFA <=10 SENSITIVE Sensitive     VANCOMYCIN 1 SENSITIVE Sensitive     TETRACYCLINE <=1 SENSITIVE Sensitive     MOXIFLOXACIN <=0.25 SENSITIVE Sensitive     * MODERATE STAPHYLOCOCCUS AUREUS  Anaerobic culture     Status: None (Preliminary result)   Collection Time: 08/10/14 11:54 AM  Result Value Ref Range Status   Specimen Description SHOULDER LEFT  Final   Special Requests NONE  Final   Gram Stain   Final    MODERATE WBC PRESENT, PREDOMINANTLY PMN NO SQUAMOUS EPITHELIAL CELLS SEEN MODERATE GRAM POSITIVE  COCCI IN PAIRS IN CLUSTERS Performed at [facility]    Culture   Final    No Anaerobes Isolated; Culture in Progress for 14 DAYS Performed at [facility]    Report Status PENDING  Incomplete  Body fluid culture     Status: None   Collection Time: 08/10/14  5:22 PM  Result Value Ref Range Status   Specimen Description SYNOVIAL JOINT RIGHT KNEE  Final  Special Requests Normal  Final   Gram Stain   Final    ABUNDANT WBC PRESENT,BOTH PMN AND MONONUCLEAR NO ORGANISMS SEEN Performed at [facility]    Culture   Final    FEW STAPHYLOCOCCUS AUREUS Note: RIFAMPIN AND GENTAMICIN SHOULD NOT BE USED AS SINGLE DRUGS FOR TREATMENT OF STAPH INFECTIONS. CRITICAL RESULT CALLED TO, READ BACK BY AND VERIFIED WITH: CINDY FLORES RN 08/13/14 AT 57 AM BY [facility] Performed at [facility]    Report Status 08/14/2014 FINAL  Final   Organism ID, Bacteria STAPHYLOCOCCUS AUREUS  Final      Susceptibility   Staphylococcus aureus - MIC*    CLINDAMYCIN <=0.25 SENSITIVE Sensitive     ERYTHROMYCIN <=0.25 SENSITIVE Sensitive     GENTAMICIN <=0.5 SENSITIVE Sensitive     LEVOFLOXACIN 0.25 SENSITIVE Sensitive     OXACILLIN 0.5 SENSITIVE Sensitive     PENICILLIN >=0.5 RESISTANT Resistant     RIFAMPIN <=0.5 SENSITIVE Sensitive     TRIMETH/SULFA <=10 SENSITIVE Sensitive     VANCOMYCIN <=0.5 SENSITIVE Sensitive     TETRACYCLINE <=1 SENSITIVE Sensitive     MOXIFLOXACIN <=0.25 SENSITIVE Sensitive     * FEW STAPHYLOCOCCUS AUREUS  Surgical pcr screen     Status: Abnormal   Collection Time: 08/10/14  6:42 PM  Result Value Ref Range Status   MRSA, PCR NEGATIVE NEGATIVE Final   Staphylococcus aureus POSITIVE (A) NEGATIVE Final    Comment:        The Xpert SA Assay (FDA approved for NASAL specimens in patients over 63 years of age), is one component of a comprehensive surveillance program.  Test performance has been validated by [facility] for patients greater than or equal  to 21 year old. It is not intended to diagnose infection nor to guide or monitor treatment.   Body fluid culture     Status: None   Collection Time: 08/10/14  9:14 PM  Result Value Ref Range Status   Specimen Description FLUID LEFT AC JOINT  Final   Special Requests VANCO, ROCEPHIN  Final   Gram Stain   Final    FEW WBC PRESENT, PREDOMINANTLY PMN MODERATE GRAM POSITIVE COCCI IN PAIRS IN CLUSTERS Gram Stain Report Called to,Read Back By and Verified With: Gram Stain Report Called to,Read Back By and Verified With: Laurance Flatten RN 08/11/13 9:10AM BY [place] Performed at [facility]    Culture   Final    ABUNDANT STAPHYLOCOCCUS AUREUS Note: RIFAMPIN AND GENTAMICIN SHOULD NOT BE USED AS SINGLE DRUGS FOR TREATMENT OF STAPH INFECTIONS. Performed at [facility]    Report Status 08/13/2014 FINAL  Final   Organism ID, Bacteria STAPHYLOCOCCUS AUREUS  Final      Susceptibility   Staphylococcus aureus - MIC*    CLINDAMYCIN <=0.25 SENSITIVE Sensitive     ERYTHROMYCIN <=0.25 SENSITIVE Sensitive     GENTAMICIN <=0.5 SENSITIVE Sensitive     LEVOFLOXACIN 0.25 SENSITIVE Sensitive     OXACILLIN <=0.25 SENSITIVE Sensitive     PENICILLIN >=0.5 RESISTANT Resistant     RIFAMPIN <=0.5 SENSITIVE Sensitive     TRIMETH/SULFA <=10 SENSITIVE Sensitive     VANCOMYCIN 1 SENSITIVE Sensitive     TETRACYCLINE <=1 SENSITIVE Sensitive     MOXIFLOXACIN <=0.25 SENSITIVE Sensitive     * ABUNDANT STAPHYLOCOCCUS AUREUS  Anaerobic culture     Status: None   Collection Time: 08/10/14  9:14 PM  Result Value Ref Range Status   Specimen Description FLUID LEFT  [facility] JOINT  Final   Special Requests VANCO, ROCEPHIN  Final   Gram Stain   Final    FEW WBC PRESENT, PREDOMINANTLY PMN MODERATE GRAM POSITIVE COCCI IN PAIRS IN CLUSTERS Performed at [facility]    Culture   Final    NO ANAEROBES ISOLATED Performed at [facility]    Report Status 08/15/2014 FINAL  Final  Culture, blood  (routine x 2)     Status: None (Preliminary result)   Collection Time: 08/11/14  7:00 AM  Result Value Ref Range Status   Specimen Description BLOOD RIGHT ARM  Final   Special Requests BOTTLES DRAWN AEROBIC AND ANAEROBIC 6CC  Final   Culture   Final           BLOOD CULTURE RECEIVED NO GROWTH TO DATE CULTURE WILL BE HELD FOR 5 DAYS BEFORE ISSUING A FINAL NEGATIVE REPORT Performed at [facility]    Report Status PENDING  Incomplete  Culture, blood (routine x 2)     Status: None (Preliminary result)   Collection Time: 08/11/14  7:08 AM  Result Value Ref Range Status   Specimen Description BLOOD LEFT HAND  Final   Special Requests BOTTLES DRAWN AEROBIC AND ANAEROBIC [place]  Final   Culture   Final           BLOOD CULTURE RECEIVED NO GROWTH TO DATE CULTURE WILL BE HELD FOR 5 DAYS BEFORE ISSUING A FINAL NEGATIVE REPORT Performed at [facility]    Report Status PENDING  Incomplete  Culture, Urine     Status: None   Collection Time: 08/12/14  5:47 AM  Result Value Ref Range Status   Specimen Description URINE, RANDOM  Final   Special Requests NONE  Final   Colony Count   Final    3,000 COLONIES/ML Performed at [facility]    Culture   Final    INSIGNIFICANT GROWTH Performed at [facility]    Report Status 08/13/2014 FINAL  Final  Culture, blood (routine x 2)     Status: None (Preliminary result)   Collection Time: 08/15/14  1:15 PM  Result Value Ref Range Status   Specimen Description BLOOD RIGHT HAND  Final   Special Requests BOTTLES DRAWN AEROBIC ONLY 3CC  Final   Culture   Final           BLOOD CULTURE RECEIVED NO GROWTH TO DATE CULTURE WILL BE HELD FOR 5 DAYS BEFORE ISSUING A FINAL NEGATIVE REPORT Performed at [facility]    Report Status PENDING  Incomplete  Culture, blood (routine x 2)     Status: None (Preliminary result)   Collection Time: 08/15/14  3:11 PM  Result Value Ref Range Status   Specimen Description BLOOD RIGHT HAND   Final   Special Requests   Final    BOTTLES DRAWN AEROBIC AND ANAEROBIC BLUE 10CC RED 5CC   Culture   Final           BLOOD CULTURE RECEIVED NO GROWTH TO DATE CULTURE WILL BE HELD FOR 5 DAYS BEFORE ISSUING A FINAL NEGATIVE REPORT Performed at [facility]    Report Status PENDING  Incomplete   TEE today: No vegetations seen  Assessment:  MSSA bacteremia complicated by multiple septic joints (bilateral shoulders, right knee, right hand tenosynovitis).  Repeat blood cultures are negative at 72 hours and she has no evidence of endocarditis by TEE but she remains febrile with increasing leukocytosis concerning that we  do not have source control for invasive staph infection   Plan: 1. She was changed to nafcillin for the past 36hr but has developed worsening aki, not convinced it is due to nafcillin but will change back to cefazolin for now 2. Ongoing fevers = afebrile without any changes. We will continue to monitor 3. Leukocytosis = likely elevated due to response to infection 4. Recommend to see if [place] can do arthrocentesis of left knee to do cell count and culture 5. Acute on ckd = worsening significantly in the last 48 hr. Defer to primary team for evaluation. Will adjust abtx for renal dosing 6. Right fore arm palpable cord = no dvt by u/s  Carlyle Basques, [place] [facility] [place] [phone] pager   [phone] cell 08/16/2014, 12:55 PM

## 2014-08-16 NOTE — Progress Notes (Signed)
*  PRELIMINARY RESULTS* Vascular Ultrasound Right upper extremity venous duplex has been completed.  Preliminary findings: No evidence of DVT or superficial thrombosis.     Landry Mellow, RDMS, RVT  08/16/2014, 12:39 PM

## 2014-08-16 NOTE — Consult Note (Signed)
Reason for Consult:ARF, proteinuria Referring Physician: Wyline Copas, MD  Brandy Houston is an 59 y.o. female.  HPI: Pt is a 59yo AAF with PMH sig for poorly controlled DM, HTN, CKD (baseline Scr 1.3-1.6) and new diagnosis of Hep C infection who was admitted to [facility] with left shoulder pain.  She had an elevated D-Dimer and underwent CT-angio to r/o PE on 08/07/14 and was admitted for further evaluation.  She then had an MRI with gadolinium on 08/11/14 which suggested septic arthritis.  She had another MRI with gadolinium of her hand on 08/08/14 which was consitent with cellulitis.  She was felt to have polyarticular septic arthritis and started on Vancomycin and Ancef for 3 days until being changed to nafcillin.  She had a negative TEE for SBE and we were consulted to further evaluate her AKI/CKD as her Scr has been steadily rising over the last 3 days.  The trend in Scr is seen below.  Of note, she had been taking NSAIDs as an outpt and had a h/o AKI/CKD with the use of ACE-Inhibitors in the past.    Trend in Creatinine:  CREATININE, SER  Date/Time Value Ref Range Status  08/16/2014 05:00 AM 4.12* 0.50 - 1.10 mg/dL Final  08/15/2014 03:11 PM 3.11* 0.50 - 1.10 mg/dL Final  08/14/2014 06:51 AM 1.69* 0.50 - 1.10 mg/dL Final  08/13/2014 08:09 AM 1.35* 0.50 - 1.10 mg/dL Final  08/12/2014 05:22 AM 1.42* 0.50 - 1.10 mg/dL Final  08/11/2014 07:00 AM 1.54* 0.50 - 1.10 mg/dL Final  08/10/2014 04:50 AM 1.52* 0.50 - 1.10 mg/dL Final  08/08/2014 05:20 AM 1.64* 0.50 - 1.10 mg/dL Final  08/07/2014 01:11 PM 1.66* 0.50 - 1.10 mg/dL Final  02/27/2014 12:03 PM 1.15* 0.50 - 1.10 mg/dL Final  12/15/2013 12:01 PM 1.26* 0.50 - 1.10 mg/dL Final  02/21/2013 10:51 AM 1.21* 0.50 - 1.10 mg/dL Final  11/30/2012 05:12 PM 1.20* 0.50 - 1.10 mg/dL Final  10/19/2011 04:30 AM 0.87 0.50 - 1.10 mg/dL Final  10/18/2011 05:00 PM 1.57* 0.50 - 1.10 mg/dL Final    PMH:   Past Medical History  Diagnosis Date  . Diabetes mellitus   .  Hypertension   . Peripheral neuropathy     PSH:   Past Surgical History  Procedure Laterality Date  . Cholecystectomy    . Appendectomy    . Small intestine surgery      Due to Small Bowel Obstruction  . Tubal ligation    . Knee arthroscopy Right 08/10/2014    Procedure: ARTHROSCOPY I & D KNEE;  Surgeon: Marianna Payment, MD;  Location: [facility];  Service: Orthopedics;  Laterality: Right;  . Shoulder arthroscopy Bilateral 08/10/2014    Procedure: I & D BILATERAL SHOULDERS ;  Surgeon: Marianna Payment, MD;  Location: [facility];  Service: Orthopedics;  Laterality: Bilateral;  . Tenosynovectomy Right 08/11/2014    Procedure: RIGHT WRIST IRRIGATION AND DEBRIDEMENT, TENOSYNOVECTOMY;  Surgeon: Marianna Payment, MD;  Location: [place];  Service: Orthopedics;  Laterality: Right;  . Knee arthroscopy Left 08/11/2014    Procedure: ARTHROSCOPIC WASHOUT LEFT KNEE;  Surgeon: Marianna Payment, MD;  Location: [place];  Service: Orthopedics;  Laterality: Left;  . Tee without cardioversion N/A 08/14/2014    Procedure: TRANSESOPHAGEAL ECHOCARDIOGRAM (TEE);  Surgeon: Thayer Headings, MD;  Location: [place];  Service: Cardiovascular;  Laterality: N/A;    Allergies:  Allergies  Allergen Reactions  . Compazine [Prochlorperazine] Shortness Of Breath and Swelling    TONGUE SWELLS  .  Omnipaque [Iohexol] Hives  . Shellfish-Derived Products Anaphylaxis  . Iodinated Diagnostic Agents Rash    Medications:   Prior to Admission medications   Medication Sig Start Date End Date Taking? Authorizing Provider  aspirin 81 MG chewable tablet Chew 81-162 mg by mouth daily as needed (For stroke prevention.).    Yes Historical Provider, MD  Blood Glucose Monitoring Suppl (ON CALL EXPRESS GLUCOSE METER) DEVI Use as directed 01/25/14  Yes Lance Bosch, NP  glipiZIDE (GLUCOTROL) 10 MG tablet Take 1 tablet (10 mg total) by mouth 2 (two) times daily before a meal. 02/27/14  Yes Lance Bosch, NP  glucose blood test strip  Use as instructed 01/25/14  Yes Lance Bosch, NP  hydrALAZINE (APRESOLINE) 25 MG tablet Take 1 tablet (25 mg total) by mouth 3 (three) times daily. 02/27/14  Yes Lance Bosch, NP  Menthol-Methyl Salicylate (MUSCLE RUB) 10-15 % CREA Apply 1 application topically daily as needed for muscle pain.   Yes Historical Provider, MD  TRUEPLUS LANCETS 30G MISC Use as directed 01/25/14  Yes Lance Bosch, NP  aspirin EC 81 MG tablet Take 1 tablet (81 mg total) by mouth daily as needed for pain. 04/14/13   Jonetta Osgood, MD    Inpatient medications: . amLODipine  5 mg Oral Daily  . antiseptic oral rinse  7 mL Mouth Rinse q12n4p  . aspirin  81 mg Oral Daily  . chlorhexidine  15 mL Mouth Rinse BID  . cyclobenzaprine  5 mg Oral BID  . famotidine  20 mg Oral Daily  . ferrous gluconate  324 mg Oral Q breakfast  . heparin subcutaneous  5,000 Units Subcutaneous 3 times per day  . hydrALAZINE  25 mg Oral TID  . insulin aspart  0-9 Units Subcutaneous TID WC  . nafcillin IVPB 2 gram/50 mL D5W (Pyxis)  2 g Intravenous 6 times per day  . nicotine  21 mg Transdermal Daily  . polyethylene glycol  17 g Oral BID  . sodium chloride  3 mL Intravenous Q12H    Discontinued Meds:   Medications Discontinued During This Encounter  Medication Reason  . PARoxetine (PAXIL) 10 MG tablet   . gabapentin (NEURONTIN) 300 MG capsule   . amoxicillin-clavulanate (AUGMENTIN) 875-125 MG per tablet   . amLODipine (NORVASC) 10 MG tablet   . enoxaparin (LOVENOX) injection 40 mg   . cefTRIAXone (ROCEPHIN) 2 g in dextrose 5 % 50 mL IVPB - Premix   . acetaminophen (OFIRMEV) IV [id number] mg Duplicate  . vancomycin (VANCOCIN) powder Patient Discharge  . 0.9 % irrigation (POUR BTL) Patient Discharge  . sodium chloride irrigation 0.9 % Patient Discharge  . lactated ringers irrigation solution Patient Discharge  . 0.9 %  sodium chloride infusion   . sodium chloride irrigation 0.9 % Patient Discharge  . fentaNYL (SUBLIMAZE) injection  25-50 mcg   . vancomycin (VANCOCIN) IVPB 750 mg/150 ml premix Dose change  . vancomycin (VANCOCIN) 500 mg in sodium chloride 0.9 % 100 mL IVPB Dose change  . fentaNYL (SUBLIMAZE) injection 25-50 mcg   . vancomycin (VANCOCIN) IVPB 750 mg/150 ml premix   . glipiZIDE (GLUCOTROL) tablet 10 mg   . ceFAZolin (ANCEF) IVPB 2 g/50 mL premix   . midazolam (VERSED) injection Patient Discharge  . fentaNYL (SUBLIMAZE) injection Patient Discharge  . butamben-tetracaine-benzocaine (CETACAINE) spray Patient Discharge  . 0.9 %  sodium chloride infusion   . 0.9 %  sodium chloride infusion   . promethazine (PHENERGAN) injection  6.25-12.5 mg   . ceFAZolin (ANCEF) IVPB 2 g/50 mL premix Change in therapy  . dextrose 5 %-0.45 % sodium chloride infusion   . famotidine (PEPCID) tablet 20 mg     Social History:  reports that she has been smoking.  She has never used smokeless tobacco. She reports that she drinks alcohol. She reports that she does not use illicit drugs.  Family History:   Family History  Problem Relation Age of Onset  . Cancer Mother   . Heart disease Father   . Cancer Sister   . Cancer Brother   . Diabetes Brother     Pertinent items are noted in HPI. Weight change:   Intake/Output Summary (Last 24 hours) at 08/16/14 0937 Last data filed at 08/16/14 0600  Gross per 24 hour  Intake   2050 ml  Output    150 ml  Net   1900 ml   BP 135/65 mmHg  Pulse 91  Temp(Src) 99.1 F (37.3 C) (Oral)  Resp 20  Ht 5' 7.5" (1.715 m)  Wt 90.22 kg (198 lb 14.4 oz)  BMI 30.67 kg/m2  SpO2 97%  LMP 03/16/2002 Filed Vitals:   08/15/14 0615 08/15/14 1343 08/15/14 2113 08/16/14 0649  BP: 176/78 135/63 146/62 135/65  Pulse:  88 99 91  Temp: 98.8 F (37.1 C) 98.3 F (36.8 C) 98.7 F (37.1 C) 99.1 F (37.3 C)  TempSrc: Oral Oral Oral Oral  Resp: 22 20 20 20   Height:      Weight:      SpO2: 100% 95% 95% 97%     General appearance: alert, cooperative and no distress Head: Normocephalic,  without obvious abnormality, atraumatic Neck: no adenopathy, no carotid bruit, no JVD, supple, symmetrical, trachea midline and thyroid not enlarged, symmetric, no tenderness/mass/nodules Resp: clear to auscultation bilaterally Cardio: regular rate and rhythm and no rub GI: soft, non-tender; bowel sounds normal; no masses,  no organomegaly Extremities: edema 1+  Labs: Basic Metabolic Panel:  Recent Labs Lab 08/10/14 0450 08/11/14 0700 08/12/14 0522 08/13/14 0809 08/14/14 0651 08/15/14 1511 08/16/14 0500  NA 128* 133* 136 133* 132* 127* 127*  K 4.0 3.4* 3.6 3.4* 3.8 3.4* 3.5  CL 100 99 100 98 96 96 94*  CO2 23 26 26 24 26 24 22   GLUCOSE 159* 158* 112* 164* 178* 217* 205*  BUN 30* 27* 21 23 31* 41* 46*  CREATININE 1.52* 1.54* 1.42* 1.35* 1.69* 3.11* 4.12*  ALBUMIN  --   --  1.1*  --   --   --  1.1*  CALCIUM 7.9* 7.8* 8.0* 8.2* 8.0* 7.9* 7.8*  PHOS  --   --   --   --   --   --  5.9*   Liver Function Tests:  Recent Labs Lab 08/12/14 0522 08/16/14 0500  AST 27  --   ALT 16  --   ALKPHOS 97  --   BILITOT 0.4  --   PROT 5.7*  --   ALBUMIN 1.1* 1.1*   No results for input(s): LIPASE, AMYLASE in the last 168 hours. No results for input(s): AMMONIA in the last 168 hours. CBC:  Recent Labs Lab 08/13/14 0809 08/14/14 0651 08/15/14 1511 08/16/14 0550  WBC 19.1* 19.7* 13.2* 12.4*  NEUTROABS  --   --  10.6*  --   HGB 9.9* 9.0* 8.1* 8.0*  HCT 29.6* 27.1* 24.0* 24.1*  MCV 92.2 92.2 91.3 89.6  PLT 308 348 349 374   PT/INR: @LABRCNTIP (inr:5) Cardiac  Enzymes: )No results for input(s): CKTOTAL, CKMB, CKMBINDEX, TROPONINI in the last 168 hours. CBG:  Recent Labs Lab 08/15/14 1206 08/15/14 1727 08/15/14 2100 08/15/14 2115 08/16/14 0758  GLUCAP 232* 195* 203* 196* 191*    Iron Studies: No results for input(s): IRON, TIBC, TRANSFERRIN, FERRITIN in the last 168 hours.  Xrays/Other Studies: US Renal  08/15/2014   CLINICAL DATA:  Acute renal failure.  EXAM:  RENAL/URINARY TRACT ULTRASOUND COMPLETE  COMPARISON:  None.  FINDINGS: Right Kidney:  Length: 11.6 cm. Increased echogenicity of the parenchyma consistent with renal medical disease. No hydronephrosis or mass lesion or thinning of the renal cortex.  Left Kidney:  Length: 11.9 cm. Increased echogenicity of the renal parenchyma with no thinning of the cortex and no hydronephrosis or mass.  Bladder:  Normal.  Incidental note is made of small bilateral pleural effusions.  IMPRESSION: Increased echogenicity of the renal parenchyma both kidneys consistent with renal medical disease. Small bilateral pleural effusions.   Electronically Signed   By: Lorriane Shire M.D.   On: 08/15/2014 20:08     Assessment/Plan: 1.  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  Will also check tox screen to r/o cocaine use (levimasole associated with ANCA-+ vasculitis) 1. Check bladder scan and if >200cc PVR, place foley 2. Will likely require renal biopsy.  Will consult IR in am if no UOP for HD cath as well as renal biopsy 2. MSSA polyarticular septic arthritis - without underlying source of infection (negative TEE).  ID following 3. HTN- stable 4. Anemia of chronic disease- will start aranesp and check iron stores 5. Hyponatremia- related to AKI and edema 6. DM- historically poorly controlled. Per primary svc   Makarios Madlock A 08/16/2014, 9:37 AM

## 2014-08-16 NOTE — Progress Notes (Signed)
Inpatient Rehabilitation  Pt. Is not medically ready for rehab today due to acute renal failure per Dr. Wyline Copas. Will continue to monitor for readiness and appropriateness for CIR.  My co-worker Karene Fry will follow pt in my absence tomorrow and Friday and can be reached at [phone].  Please call if questions.  [facility] Admissions Coordinator Cell [phone] Office [phone]

## 2014-08-16 NOTE — Progress Notes (Signed)
ANTIBIOTIC CONSULT NOTE - INITIAL  Pharmacy Consult for Cefazolin Indication: MSSA septic arthritis  Allergies  Allergen Reactions  . Compazine [Prochlorperazine] Shortness Of Breath and Swelling    TONGUE SWELLS  . Omnipaque [Iohexol] Hives  . Shellfish-Derived Products Anaphylaxis  . Iodinated Diagnostic Agents Rash    Patient Measurements: Height: 5' 7.5" (171.5 cm) Weight: 198 lb 14.4 oz (90.22 kg) IBW/kg (Calculated) : 62.75  Vital Signs: Temp: 99.1 F (37.3 C) (03/02 0649) Temp Source: Oral (03/02 0649) BP: 135/65 mmHg (03/02 0649) Pulse Rate: 91 (03/02 0649) Intake/Output from previous day: 03/01 0701 - 03/02 0700 In: 2050 [P.O.:740; I.V.:1110; IV Piggyback:200] Out: 150 [Urine:150] Intake/Output from this shift: Total I/O In: 240 [P.O.:240] Out: -   Labs:  Recent Labs  08/14/14 0651 08/15/14 1511 08/15/14 1755 08/16/14 0500 08/16/14 0550  WBC 19.7* 13.2*  --   --  12.4*  HGB 9.0* 8.1*  --   --  8.0*  PLT 348 349  --   --  374  LABCREA  --   --  200.26  --   --   CREATININE 1.69* 3.11*  --  4.12*  --    Estimated Creatinine Clearance: 17.1 mL/min (by C-G formula based on Cr of 4.12).    Microbiology: Recent Results (from the past 720 hour(s))  Culture, blood (routine x 2)     Status: None   Collection Time: 08/09/14 12:15 PM  Result Value Ref Range Status   Specimen Description BLOOD RIGHT HAND  Final   Special Requests   Final    BOTTLES DRAWN AEROBIC AND ANAEROBIC 7CCS BOTH BOTTLES   Culture   Final    STAPHYLOCOCCUS AUREUS Note: RIFAMPIN AND GENTAMICIN SHOULD NOT BE USED AS SINGLE DRUGS FOR TREATMENT OF STAPH INFECTIONS. Note: Gram Stain Report Called to,Read Back By and Verified With: Shirlean Kelly 700AM 08/10/14 [place] Performed at [facility]    Report Status 08/12/2014 FINAL  Final   Organism ID, Bacteria STAPHYLOCOCCUS AUREUS  Final      Susceptibility   Staphylococcus aureus - MIC*    CLINDAMYCIN <=0.25 SENSITIVE  Sensitive     ERYTHROMYCIN <=0.25 SENSITIVE Sensitive     GENTAMICIN <=0.5 SENSITIVE Sensitive     LEVOFLOXACIN 0.25 SENSITIVE Sensitive     OXACILLIN <=0.25 SENSITIVE Sensitive     PENICILLIN >=0.5 RESISTANT Resistant     RIFAMPIN <=0.5 SENSITIVE Sensitive     TRIMETH/SULFA <=10 SENSITIVE Sensitive     VANCOMYCIN 1 SENSITIVE Sensitive     TETRACYCLINE <=1 SENSITIVE Sensitive     MOXIFLOXACIN <=0.25 SENSITIVE Sensitive     * STAPHYLOCOCCUS AUREUS  Culture, blood (routine x 2)     Status: None   Collection Time: 08/09/14 12:25 PM  Result Value Ref Range Status   Specimen Description BLOOD RIGHT ARM  Final   Special Requests   Final    BOTTLES DRAWN AEROBIC AND ANAEROBIC 10CC BOTH BOTTLES   Culture   Final    STAPHYLOCOCCUS AUREUS Note: SUSCEPTIBILITIES PERFORMED ON PREVIOUS CULTURE WITHIN THE LAST 5 DAYS. Note: Gram Stain Report Called to,Read Back By and Verified With: Shirlean Kelly 700AM 08/10/14 [place] Performed at [facility]    Report Status 08/12/2014 FINAL  Final  Culture, routine-abscess     Status: None   Collection Time: 08/10/14 11:54 AM  Result Value Ref Range Status   Specimen Description SHOULDER LEFT  Final   Special Requests NONE  Final   Gram Stain  Final    ABUNDANT WBC PRESENT, PREDOMINANTLY PMN NO SQUAMOUS EPITHELIAL CELLS SEEN MODERATE GRAM POSITIVE COCCI IN PAIRS IN CLUSTERS Performed at [facility]    Culture   Final    MODERATE STAPHYLOCOCCUS AUREUS Note: RIFAMPIN AND GENTAMICIN SHOULD NOT BE USED AS SINGLE DRUGS FOR TREATMENT OF STAPH INFECTIONS. Performed at [facility]    Report Status 08/13/2014 FINAL  Final   Organism ID, Bacteria STAPHYLOCOCCUS AUREUS  Final      Susceptibility   Staphylococcus aureus - MIC*    CLINDAMYCIN <=0.25 SENSITIVE Sensitive     ERYTHROMYCIN <=0.25 SENSITIVE Sensitive     GENTAMICIN <=0.5 SENSITIVE Sensitive     LEVOFLOXACIN 0.25 SENSITIVE Sensitive     OXACILLIN 0.5 SENSITIVE  Sensitive     PENICILLIN >=0.5 RESISTANT Resistant     RIFAMPIN <=0.5 SENSITIVE Sensitive     TRIMETH/SULFA <=10 SENSITIVE Sensitive     VANCOMYCIN 1 SENSITIVE Sensitive     TETRACYCLINE <=1 SENSITIVE Sensitive     MOXIFLOXACIN <=0.25 SENSITIVE Sensitive     * MODERATE STAPHYLOCOCCUS AUREUS  Anaerobic culture     Status: None (Preliminary result)   Collection Time: 08/10/14 11:54 AM  Result Value Ref Range Status   Specimen Description SHOULDER LEFT  Final   Special Requests NONE  Final   Gram Stain   Final    MODERATE WBC PRESENT, PREDOMINANTLY PMN NO SQUAMOUS EPITHELIAL CELLS SEEN MODERATE GRAM POSITIVE COCCI IN PAIRS IN CLUSTERS Performed at [facility]    Culture   Final    No Anaerobes Isolated; Culture in Progress for 14 DAYS Performed at [facility]    Report Status PENDING  Incomplete  Body fluid culture     Status: None   Collection Time: 08/10/14  5:22 PM  Result Value Ref Range Status   Specimen Description SYNOVIAL JOINT RIGHT KNEE  Final   Special Requests Normal  Final   Gram Stain   Final    ABUNDANT WBC PRESENT,BOTH PMN AND MONONUCLEAR NO ORGANISMS SEEN Performed at [facility]    Culture   Final    FEW STAPHYLOCOCCUS AUREUS Note: RIFAMPIN AND GENTAMICIN SHOULD NOT BE USED AS SINGLE DRUGS FOR TREATMENT OF STAPH INFECTIONS. CRITICAL RESULT CALLED TO, READ BACK BY AND VERIFIED WITH: CINDY FLORES RN 08/13/14 AT 41 AM BY [facility] Performed at [facility]    Report Status 08/14/2014 FINAL  Final   Organism ID, Bacteria STAPHYLOCOCCUS AUREUS  Final      Susceptibility   Staphylococcus aureus - MIC*    CLINDAMYCIN <=0.25 SENSITIVE Sensitive     ERYTHROMYCIN <=0.25 SENSITIVE Sensitive     GENTAMICIN <=0.5 SENSITIVE Sensitive     LEVOFLOXACIN 0.25 SENSITIVE Sensitive     OXACILLIN 0.5 SENSITIVE Sensitive     PENICILLIN >=0.5 RESISTANT Resistant     RIFAMPIN <=0.5 SENSITIVE Sensitive     TRIMETH/SULFA <=10 SENSITIVE Sensitive      VANCOMYCIN <=0.5 SENSITIVE Sensitive     TETRACYCLINE <=1 SENSITIVE Sensitive     MOXIFLOXACIN <=0.25 SENSITIVE Sensitive     * FEW STAPHYLOCOCCUS AUREUS  Surgical pcr screen     Status: Abnormal   Collection Time: 08/10/14  6:42 PM  Result Value Ref Range Status   MRSA, PCR NEGATIVE NEGATIVE Final   Staphylococcus aureus POSITIVE (A) NEGATIVE Final    Comment:        The Xpert SA Assay (FDA approved for NASAL specimens in patients over 21 years of  age), is one component of a comprehensive surveillance program.  Test performance has been validated by [facility] for patients greater than or equal to 63 year old. It is not intended to diagnose infection nor to guide or monitor treatment.   Body fluid culture     Status: None   Collection Time: 08/10/14  9:14 PM  Result Value Ref Range Status   Specimen Description FLUID LEFT AC JOINT  Final   Special Requests VANCO, ROCEPHIN  Final   Gram Stain   Final    FEW WBC PRESENT, PREDOMINANTLY PMN MODERATE GRAM POSITIVE COCCI IN PAIRS IN CLUSTERS Gram Stain Report Called to,Read Back By and Verified With: Gram Stain Report Called to,Read Back By and Verified With: Laurance Flatten RN 08/11/13 9:10AM BY [place] Performed at [facility]    Culture   Final    ABUNDANT STAPHYLOCOCCUS AUREUS Note: RIFAMPIN AND GENTAMICIN SHOULD NOT BE USED AS SINGLE DRUGS FOR TREATMENT OF STAPH INFECTIONS. Performed at [facility]    Report Status 08/13/2014 FINAL  Final   Organism ID, Bacteria STAPHYLOCOCCUS AUREUS  Final      Susceptibility   Staphylococcus aureus - MIC*    CLINDAMYCIN <=0.25 SENSITIVE Sensitive     ERYTHROMYCIN <=0.25 SENSITIVE Sensitive     GENTAMICIN <=0.5 SENSITIVE Sensitive     LEVOFLOXACIN 0.25 SENSITIVE Sensitive     OXACILLIN <=0.25 SENSITIVE Sensitive     PENICILLIN >=0.5 RESISTANT Resistant     RIFAMPIN <=0.5 SENSITIVE Sensitive     TRIMETH/SULFA <=10 SENSITIVE Sensitive     VANCOMYCIN 1 SENSITIVE  Sensitive     TETRACYCLINE <=1 SENSITIVE Sensitive     MOXIFLOXACIN <=0.25 SENSITIVE Sensitive     * ABUNDANT STAPHYLOCOCCUS AUREUS  Anaerobic culture     Status: None   Collection Time: 08/10/14  9:14 PM  Result Value Ref Range Status   Specimen Description FLUID LEFT AC JOINT  Final   Special Requests VANCO, ROCEPHIN  Final   Gram Stain   Final    FEW WBC PRESENT, PREDOMINANTLY PMN MODERATE GRAM POSITIVE COCCI IN PAIRS IN CLUSTERS Performed at [facility]    Culture   Final    NO ANAEROBES ISOLATED Performed at [facility]    Report Status 08/15/2014 FINAL  Final  Culture, blood (routine x 2)     Status: None (Preliminary result)   Collection Time: 08/11/14  7:00 AM  Result Value Ref Range Status   Specimen Description BLOOD RIGHT ARM  Final   Special Requests BOTTLES DRAWN AEROBIC AND ANAEROBIC 6CC  Final   Culture   Final           BLOOD CULTURE RECEIVED NO GROWTH TO DATE CULTURE WILL BE HELD FOR 5 DAYS BEFORE ISSUING A FINAL NEGATIVE REPORT Performed at [facility]    Report Status PENDING  Incomplete  Culture, blood (routine x 2)     Status: None (Preliminary result)   Collection Time: 08/11/14  7:08 AM  Result Value Ref Range Status   Specimen Description BLOOD LEFT HAND  Final   Special Requests BOTTLES DRAWN AEROBIC AND ANAEROBIC [place]  Final   Culture   Final           BLOOD CULTURE RECEIVED NO GROWTH TO DATE CULTURE WILL BE HELD FOR 5 DAYS BEFORE ISSUING A FINAL NEGATIVE REPORT Performed at [facility]    Report Status PENDING  Incomplete  Culture, Urine     Status: None  Collection Time: 08/12/14  5:47 AM  Result Value Ref Range Status   Specimen Description URINE, RANDOM  Final   Special Requests NONE  Final   Colony Count   Final    3,000 COLONIES/ML Performed at [facility]    Culture   Final    INSIGNIFICANT GROWTH Performed at [facility]    Report Status 08/13/2014 FINAL  Final  Culture, blood  (routine x 2)     Status: None (Preliminary result)   Collection Time: 08/15/14  1:15 PM  Result Value Ref Range Status   Specimen Description BLOOD RIGHT HAND  Final   Special Requests BOTTLES DRAWN AEROBIC ONLY 3CC  Final   Culture   Final           BLOOD CULTURE RECEIVED NO GROWTH TO DATE CULTURE WILL BE HELD FOR 5 DAYS BEFORE ISSUING A FINAL NEGATIVE REPORT Performed at [facility]    Report Status PENDING  Incomplete  Culture, blood (routine x 2)     Status: None (Preliminary result)   Collection Time: 08/15/14  3:11 PM  Result Value Ref Range Status   Specimen Description BLOOD RIGHT HAND  Final   Special Requests   Final    BOTTLES DRAWN AEROBIC AND ANAEROBIC BLUE 10CC RED 5CC   Culture   Final           BLOOD CULTURE RECEIVED NO GROWTH TO DATE CULTURE WILL BE HELD FOR 5 DAYS BEFORE ISSUING A FINAL NEGATIVE REPORT Performed at [facility]    Report Status PENDING  Incomplete    Medical History: Past Medical History  Diagnosis Date  . Diabetes mellitus   . Hypertension   . Peripheral neuropathy     Medications:  Scheduled:  . amLODipine  5 mg Oral Daily  . antiseptic oral rinse  7 mL Mouth Rinse q12n4p  . aspirin  81 mg Oral Daily  . chlorhexidine  15 mL Mouth Rinse BID  . cyclobenzaprine  5 mg Oral BID  . famotidine  20 mg Oral Daily  . ferrous gluconate  324 mg Oral Q breakfast  . heparin subcutaneous  5,000 Units Subcutaneous 3 times per day  . hydrALAZINE  25 mg Oral TID  . insulin aspart  0-9 Units Subcutaneous TID WC  . nicotine  21 mg Transdermal Daily  . polyethylene glycol  17 g Oral BID  . sodium chloride  3 mL Intravenous Q12H   Assessment: 59 yo F on day # 8 total abx coverage for MSSA septic arthritis.  Tehrapy was changed from Ancef to Nafcillin on 3/1.  Since then pt has had AKI with SCr increasing from 1.6 > 3.1 > 4.1 over the last 36 hours.  Asked by ID to change patient back to Ancef.  Will renally dose adjust based on current  CrCl.  Goal of Therapy:  Eradicate infection; Renal dose adjustment of antibiotics  Plan:  Ancef 2gm IV q12h Follow renal function, clinical progress  [employer], Pharm.D., BCPS Clinical Pharmacist Pager [phone] 08/16/2014 1:53 PM

## 2014-08-16 NOTE — Progress Notes (Signed)
RN assessed patient for urination this am- patient states that she has not urinated all night and does not feel the urge. RN bladder scanned patient- residual 211cc. Charge nurse Sharol Given and second RN [place] bladder scanned patient as well- residual 212. Patient's bedpad is a little soiled but patient states that urine comes out when she coughs. Patient urinated 50cc.Will continue to monitor and notify NP with any changes.

## 2014-08-17 ENCOUNTER — Encounter (HOSPITAL_COMMUNITY): Payer: Self-pay | Admitting: Radiology

## 2014-08-17 DIAGNOSIS — N179 Acute kidney failure, unspecified: Secondary | ICD-10-CM | POA: Insufficient documentation

## 2014-08-17 LAB — CBC
HCT: 24.5 % — ABNORMAL LOW (ref 36.0–46.0)
Hemoglobin: 8.3 g/dL — ABNORMAL LOW (ref 12.0–15.0)
MCH: 30.2 pg (ref 26.0–34.0)
MCHC: 33.9 g/dL (ref 30.0–36.0)
MCV: 89.1 fL (ref 78.0–100.0)
Platelets: 427 10*3/uL — ABNORMAL HIGH (ref 150–400)
RBC: 2.75 MIL/uL — ABNORMAL LOW (ref 3.87–5.11)
RDW: 13.4 % (ref 11.5–15.5)
WBC: 11.1 10*3/uL — ABNORMAL HIGH (ref 4.0–10.5)

## 2014-08-17 LAB — BASIC METABOLIC PANEL
Anion gap: 14 (ref 5–15)
BUN: 54 mg/dL — ABNORMAL HIGH (ref 6–23)
CO2: 17 mmol/L — ABNORMAL LOW (ref 19–32)
Calcium: 7.7 mg/dL — ABNORMAL LOW (ref 8.4–10.5)
Chloride: 97 mmol/L (ref 96–112)
Creatinine, Ser: 5.28 mg/dL — ABNORMAL HIGH (ref 0.50–1.10)
GFR calc Af Amer: 9 mL/min — ABNORMAL LOW (ref >90)
GFR calc non Af Amer: 8 mL/min — ABNORMAL LOW (ref >90)
Glucose, Bld: 121 mg/dL — ABNORMAL HIGH (ref 70–99)
Potassium: 3.9 mmol/L (ref 3.5–5.1)
Sodium: 128 mmol/L — ABNORMAL LOW (ref 135–145)

## 2014-08-17 LAB — CULTURE, BLOOD (ROUTINE X 2)
Culture: NO GROWTH
Culture: NO GROWTH

## 2014-08-17 LAB — PROTEIN, URINE, 24 HOUR
Collection Interval-UPROT: 24 hours
Protein, 24H Urine: 868 mg/d — ABNORMAL HIGH (ref 50–100)
Protein, Urine: 620 mg/dL
Urine Total Volume-UPROT: 140 mL

## 2014-08-17 LAB — GLUCOSE, CAPILLARY
Glucose-Capillary: 119 mg/dL — ABNORMAL HIGH (ref 70–99)
Glucose-Capillary: 136 mg/dL — ABNORMAL HIGH (ref 70–99)
Glucose-Capillary: 129 mg/dL — ABNORMAL HIGH (ref 70–99)
Glucose-Capillary: 132 mg/dL — ABNORMAL HIGH (ref 70–99)

## 2014-08-17 LAB — MPO/PR-3 (ANCA) ANTIBODIES
ANCA Proteinase 3: <3.5 U/mL (ref 0.0–3.5)
Myeloperoxidase Abs: <9 U/mL (ref 0.0–9.0)

## 2014-08-17 LAB — ANTI-DNA ANTIBODY, DOUBLE-STRANDED: ds DNA Ab: <1 IU/mL

## 2014-08-17 LAB — HEPATITIS C GENOTYPE

## 2014-08-17 LAB — COMPLEMENT, TOTAL: Compl, Total (CH50): <10 U/mL — ABNORMAL LOW (ref 42–60)

## 2014-08-17 MED ORDER — FUROSEMIDE 10 MG/ML IJ SOLN
40.0000 mg | Freq: Once | INTRAMUSCULAR | Status: AC
  Administered 2014-08-17: 40 mg via INTRAVENOUS
  Filled 2014-08-17: qty 4

## 2014-08-17 MED ORDER — CEFAZOLIN SODIUM 1-5 GM-% IV SOLN
1.0000 g | Freq: Two times a day (BID) | INTRAVENOUS | Status: DC
  Administered 2014-08-18: 1 g via INTRAVENOUS
  Filled 2014-08-17 (×3): qty 50

## 2014-08-17 NOTE — H&P (Signed)
Reason for Consult: Acute renal failure Chief Complaint: Chief Complaint  Patient presents with  . Shoulder Pain   Referring Physician(s): Nephrology- Dr. Marval Regal   History of Present Illness: Brandy Houston is a 59 y.o. female with acute on chronic renal failure unknown etiology. Nephrology has seen the patient and has requested IR to evaluate for renal biopsy and HD catheter. She denies any chest pain. She denies any active signs of bleeding or excessive bruising. She denies any active fever or chills. She c/o b/l shoulder and knee pain.  Past Medical History  Diagnosis Date  . Diabetes mellitus   . Hypertension   . Peripheral neuropathy     Past Surgical History  Procedure Laterality Date  . Cholecystectomy    . Appendectomy    . Small intestine surgery      Due to Small Bowel Obstruction  . Tubal ligation    . Knee arthroscopy Right 08/10/2014    Procedure: ARTHROSCOPY I & D KNEE;  Surgeon: Marianna Payment, MD;  Location: [facility];  Service: Orthopedics;  Laterality: Right;  . Shoulder arthroscopy Bilateral 08/10/2014    Procedure: I & D BILATERAL SHOULDERS ;  Surgeon: Marianna Payment, MD;  Location: [facility];  Service: Orthopedics;  Laterality: Bilateral;  . Tenosynovectomy Right 08/11/2014    Procedure: RIGHT WRIST IRRIGATION AND DEBRIDEMENT, TENOSYNOVECTOMY;  Surgeon: Marianna Payment, MD;  Location: [facility];  Service: Orthopedics;  Laterality: Right;  . Knee arthroscopy Left 08/11/2014    Procedure: ARTHROSCOPIC WASHOUT LEFT KNEE;  Surgeon: Marianna Payment, MD;  Location: [place];  Service: Orthopedics;  Laterality: Left;  . Tee without cardioversion N/A 08/14/2014    Procedure: TRANSESOPHAGEAL ECHOCARDIOGRAM (TEE);  Surgeon: Thayer Headings, MD;  Location: [place];  Service: Cardiovascular;  Laterality: N/A;    Allergies: Compazine; Omnipaque; Shellfish-derived products; and Iodinated diagnostic agents  Medications: Prior to Admission medications     Medication Sig Start Date End Date Taking? Authorizing Provider  aspirin 81 MG chewable tablet Chew 81-162 mg by mouth daily as needed (For stroke prevention.).    Yes Historical Provider, MD  Blood Glucose Monitoring Suppl (ON CALL EXPRESS GLUCOSE METER) DEVI Use as directed 01/25/14  Yes Lance Bosch, NP  glipiZIDE (GLUCOTROL) 10 MG tablet Take 1 tablet (10 mg total) by mouth 2 (two) times daily before a meal. 02/27/14  Yes Lance Bosch, NP  glucose blood test strip Use as instructed 01/25/14  Yes Lance Bosch, NP  hydrALAZINE (APRESOLINE) 25 MG tablet Take 1 tablet (25 mg total) by mouth 3 (three) times daily. 02/27/14  Yes Lance Bosch, NP  Menthol-Methyl Salicylate (MUSCLE RUB) 10-15 % CREA Apply 1 application topically daily as needed for muscle pain.   Yes Historical Provider, MD  TRUEPLUS LANCETS 30G MISC Use as directed 01/25/14  Yes Lance Bosch, NP  aspirin EC 81 MG tablet Take 1 tablet (81 mg total) by mouth daily as needed for pain. 04/14/13   Jonetta Osgood, MD     Family History  Problem Relation Age of Onset  . Cancer Mother   . Heart disease Father   . Cancer Sister   . Cancer Brother   . Diabetes Brother     History   Social History  . Marital Status: Single    Spouse Name: N/A  . Number of Children: N/A  . Years of Education: N/A   Social History Main Topics  . Smoking status: Current Every  Day Smoker -- 0.50 packs/day for 20 years  . Smokeless tobacco: Never Used  . Alcohol Use: Yes     Comment: ocassionally  . Drug Use: No  . Sexual Activity: No   Other Topics Concern  . None   Social History Narrative    Review of Systems: A 12 point ROS discussed and pertinent positives are indicated in the HPI above.  All other systems are negative.  Review of Systems  Vital Signs: BP 145/71 mmHg  Pulse 91  Temp(Src) 98.3 F (36.8 C) (Oral)  Resp 20  Ht 5' 7.5" (1.715 m)  Wt 198 lb 14.4 oz (90.22 kg)  BMI 30.67 kg/m2  SpO2 97%  LMP  03/16/2002  Physical Exam  Constitutional: She is oriented to person, place, and time. No distress.  HENT:  Head: Normocephalic and atraumatic.  Cardiovascular: Normal rate and regular rhythm.  Exam reveals no gallop and no friction rub.   No murmur heard. Pulmonary/Chest: Effort normal and breath sounds normal. No respiratory distress. She has no wheezes. She has no rales.  Musculoskeletal: She exhibits edema.  Neurological: She is alert and oriented to person, place, and time.  Skin: She is not diaphoretic.    Mallampati Score:  MD Evaluation Airway: WNL Heart: WNL Abdomen: WNL Chest/ Lungs: WNL ASA  Classification: 2 Mallampati/Airway Score: Two  Imaging: Dg Chest 2 View  08/07/2014   CLINICAL DATA:  Left shoulder pain for 5 days. No known injury. Initial encounter.  EXAM: CHEST  2 VIEW  COMPARISON:  None.  FINDINGS: The heart size and mediastinal contours are within normal limits. Both lungs are clear. No pneumothorax or pleural effusion is noted. The visualized skeletal structures are unremarkable.  IMPRESSION: No acute cardiopulmonary abnormality seen.   Electronically Signed   By: Marijo Conception, M.D.   On: 08/07/2014 15:02   Ct Angio Chest Pe W/cm &/or Wo Cm  08/07/2014   CLINICAL DATA:  Tachycardia and hypoxia. Left shoulder pain for 4 days. Renal insufficiency.  EXAM: CT ANGIOGRAPHY CHEST WITH CONTRAST  TECHNIQUE: Multidetector CT imaging of the chest was performed using the standard protocol during bolus administration of intravenous contrast. Multiplanar CT image reconstructions and MIPs were obtained to evaluate the vascular anatomy.  CONTRAST:  162mL OMNIPAQUE IOHEXOL 350 MG/ML SOLN. The patient was pre-medicated with Benadryl and Solu-Medrol due to a history of hives with Omnipaque.  COMPARISON:  08/07/2014; 10/18/2011  FINDINGS: Mediastinum/Nodes: No filling defect is identified in the pulmonary arterial tree to suggest pulmonary embolus. Mild atherosclerotic  calcification of the aortic arch without acute aortic abnormality identified. No pathologic thoracic adenopathy. Mild cardiomegaly.  Lungs/Pleura: Secondary pulmonary lobular interstitial accentuation noted particularly at the lung apices. Mild atelectasis dependently in both lower lobes. No airspace opacity identified.  Upper abdomen: Unremarkable  Musculoskeletal: Mid thoracic spondylosis and mild kyphosis.  Review of the MIP images confirms the above findings.  IMPRESSION: 1. Mild cardiomegaly with secondary pulmonary lobular interstitial accentuation compatible with mild interstitial edema. 2. No embolus identified.   Electronically Signed   By: Van Clines M.D.   On: 08/07/2014 17:25   US Renal  08/15/2014   CLINICAL DATA:  Acute renal failure.  EXAM: RENAL/URINARY TRACT ULTRASOUND COMPLETE  COMPARISON:  None.  FINDINGS: Right Kidney:  Length: 11.6 cm. Increased echogenicity of the parenchyma consistent with renal medical disease. No hydronephrosis or mass lesion or thinning of the renal cortex.  Left Kidney:  Length: 11.9 cm. Increased echogenicity of the renal parenchyma with  no thinning of the cortex and no hydronephrosis or mass.  Bladder:  Normal.  Incidental note is made of small bilateral pleural effusions.  IMPRESSION: Increased echogenicity of the renal parenchyma both kidneys consistent with renal medical disease. Small bilateral pleural effusions.   Electronically Signed   By: Lorriane Shire M.D.   On: 08/15/2014 20:08   Mr Hand Right W Wo Contrast  08/11/2014   CLINICAL DATA:  RIGHT hand swelling. Septic arthritis. Previous aspirations and debridement for septic arthritis.  EXAM: MRI OF THE RIGHT HAND WITHOUT AND WITH CONTRAST  TECHNIQUE: Multiplanar, multisequence MR imaging was performed both before and after administration of intravenous contrast.  CONTRAST:  11mL MULTIHANCE GADOBENATE DIMEGLUMINE 529 MG/ML IV SOLN  COMPARISON:  None.  FINDINGS: Edema is present over the dorsum of the  hand without abscess. There is flexor and extensor tenosynovitis in the long finger, which may be septic or reactive. The other flexor and extensor compartments appear normal. No deep soft tissue abscess. Periarticular edema is present at third MCP joint however there is no chondrolysis or subchondral marrow edema identified at the third MCP joint. Small third MCP joint effusion. Septic arthritis is considered unlikely based on the lack of secondary findings. The IP joints appear within normal limits. Small cyst or prominent vascular entry zone is present in the radial aspect of the third metacarpal head.  IMPRESSION: Long finger extensor and flexor tenosynovitis, concerning for septic tenosynovitis. Inflammatory changes at the third MCP joint are likely reactive rather than septic arthritis based on the lack of marrow signal changes and chondrolysis. Cellulitis of the hand.   Electronically Signed   By: Dereck Ligas M.D.   On: 08/11/2014 12:35   Mr Shoulder Left Wo Contrast  08/09/2014   CLINICAL DATA:  Acute onset intractable left shoulder pain 08/03/2014 after are a lifting injury.  EXAM: MRI OF THE LEFT SHOULDER WITHOUT CONTRAST  TECHNIQUE: Multiplanar, multisequence MR imaging of the shoulder was performed. No intravenous contrast was administered.  COMPARISON:  Plain films left shoulder 08/07/2014.  FINDINGS: The study is degraded by patient motion.  Rotator cuff: Supraspinatus worse than infraspinatus tendinopathy without tear is identified.  Muscles:  No atrophy or focal lesion.  Biceps long head:  Intact.  Acromioclavicular Joint: Moderate appearing acromioclavicular osteoarthritis is present. There is fluid within the joint. Intense soft tissue edema is seen about the joint and surrounding fatty and muscular tissues including the deltoid and superior aspect of the supraspinatus. No focal fluid collection is identified.  Labrum:  Intact.  Bones: As described above. No fracture is identified. The  acromion is type 2. There is fluid in the subacromial/subdeltoid bursa.  IMPRESSION: Dominant finding is the abnormal appearance of the acromioclavicular joint highly suspicious for septic joint with surrounding cellulitis and myositis.  Moderate acromioclavicular osteoarthritis.  Mild supraspinatus and infraspinatus tendinopathy without tear.  These results will be called to the ordering clinician or representative by the Radiologist Assistant, and communication documented in the PACS or zVision Dashboard.   Electronically Signed   By: Inge Rise M.D.   On: 08/09/2014 08:48   Ir Fluoro Guide Ndl Plmt / Bx  08/10/2014   CLINICAL DATA:  59 year old female with left shoulder pain and evidence on recent MRI of septic arthritis of acromioclavicular joint. She has been referred for evaluation of aspiration.  EXAM: ULTRASOUND AND FLUOROSCOPIC GUIDED LEFT ACROMIOCLAVICULAR JOINT ASPIRATION.  TECHNIQUE: The procedure, risks, benefits, and alternatives were explained to the patient. Questions regarding the procedure  were encouraged and answered. The patient understands and consents to the procedure.  Fluoroscopic image of the left shoulder performed. Ultrasound survey of the shoulder performed. Image was stored and sent to PACs with both modality.  Using fluoroscopy, the skin and subcutaneous tissues overlying the left acromioclavicular joint were generously infiltrated 1% lidocaine for local anesthesia.  We then used ultrasound guidance to advance an 18 gauge needle attached to a syringe into the left acromioclavicular joint fluid. Two separate aspiration were performed. Approximately 1-2 cc of fluid were aspirated.  Samples were sent to the lab for analysis.  Patient tolerated the procedure well and remained hemodynamically stable throughout.  No complications were encountered and no significant blood loss was encountered.  COMPLICATIONS: None  IMPRESSION: Status post fluoroscopic and ultrasound-guided left AC  joint aspiration with sample sent to the lab for analysis.  Signed,  Dulcy Fanny. Earleen Newport, DO  [facility]  [facility]   Electronically Signed   By: Corrie Mckusick D.O.   On: 08/10/2014 12:07   Dg Shoulder Left  08/07/2014   CLINICAL DATA:  59 year old female with left shoulder pain after have the lifting injury on 08/03/2014. Initial encounter.  EXAM: LEFT SHOULDER - 2+ VIEW  COMPARISON:  None.  FINDINGS: Bone mineralization is within normal limits for age. No glenohumeral joint dislocation. Proximal left humerus intact. Left clavicle and scapula appear intact. Negative visible left ribs and lung parenchyma.  IMPRESSION: No acute osseous abnormality identified at the left shoulder.   Electronically Signed   By: Genevie Ann M.D.   On: 08/07/2014 11:11    Labs:  CBC:  Recent Labs  08/14/14 0651 08/15/14 1511 08/16/14 0550 08/17/14 0525  WBC 19.7* 13.2* 12.4* 11.1*  HGB 9.0* 8.1* 8.0* 8.3*  HCT 27.1* 24.0* 24.1* 24.5*  PLT 348 349 374 427*    COAGS:  Recent Labs  08/07/14 1603  INR 1.11  APTT 35    BMP:  Recent Labs  08/14/14 0651 08/15/14 1511 08/16/14 0500 08/17/14 0525  NA 132* 127* 127* 128*  K 3.8 3.4* 3.5 3.9  CL 96 96 94* 97  CO2 26 24 22  17*  GLUCOSE 178* 217* 205* 121*  BUN 31* 41* 46* 54*  CALCIUM 8.0* 7.9* 7.8* 7.7*  CREATININE 1.69* 3.11* 4.12* 5.28*  GFRNONAA 32* 15* 11* 8*  GFRAA 37* 18* 13* 9*    LIVER FUNCTION TESTS:  Recent Labs  12/15/13 1201 02/27/14 1203 08/12/14 0522 08/16/14 0500  BILITOT 0.3 0.3 0.4  --   AST 21 17 27   --   ALT 55* 40* 16  --   ALKPHOS 73 150* 97  --   PROT 7.5 7.2 5.7*  --   ALBUMIN 3.4* 3.3* 1.1* 1.1*   Assessment and Plan: Acute on chronic renal failure, seen by Nephrology-Etiology unknown, contrast-induced nephropathy, post-infectious GN, 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 Request for HD catheter and renal biopsy on 3/4 with moderate sedation MSSA bacteremia with polyarticular septic arthritis, ID on board, TEE without vegetations, recent BCx 08/11/14 and 08/15/14 no growth, wbc 11k trending down, afebrile The patient will be NPO after midnight, sq heparin held, labs and vitals have been reviewed. Risks and Benefits discussed with the patient including, but not limited to bleeding, infection, damage to adjacent structures or low yield requiring additional tests. All of the patient's questions were answered, patient  is agreeable to proceed. Consent signed and in chart. Will check am labs and vitals and if continued worsening renal function will proceed with HD catheter placement.   Thank you for this interesting consult.  I greatly enjoyed meeting Lea Purviance and look forward to participating in their care.  SignedHedy Jacob 08/17/2014, 1:01 PM   I spent a total of 40 Minutes in face to face in clinical consultation, greater than 50% of which was counseling/coordinating care for acute on chronic renal failure.

## 2014-08-17 NOTE — Progress Notes (Signed)
Rehab admissions - I met with patient.  She tells me that she is to have a renal biopsy.  Not ready for inpatient rehab admission yet.  Call me for questions.  #[phone]

## 2014-08-17 NOTE — Clinical Social Work Placement (Cosign Needed)
Clinical Social Work Department CLINICAL SOCIAL WORK PLACEMENT NOTE 08/17/2014  Patient:  Brandy Houston, Brandy Houston  Account Number:  [id number] Admit date:  08/07/2014  Clinical Social Worker:  Kingsley Spittle, CLINICAL SOCIAL WORKER  Date/time:  08/17/2014 03:25 PM  Clinical Social Work is seeking post-discharge placement for this patient at the following level of care:   SKILLED NURSING   (*CSW will update this form in Epic as items are completed)   08/17/2014  Patient/family provided with [facility] Department of Clinical Social Work's list of facilities offering this level of care within the geographic area requested by the patient (or if unable, by the patient's family).  08/17/2014  Patient/family informed of their freedom to choose among providers that offer the needed level of care, that participate in Medicare, Medicaid or managed care program needed by the patient, have an available bed and are willing to accept the patient.  08/17/2014  Patient/family informed of [facility]' ownership interest in [facility], as well as of the fact that they are under no obligation to receive care at this facility.  PASARR submitted to EDS on 08/17/2014 PASARR number received on 08/17/2014  FL2 transmitted to all facilities in geographic area requested by pt/family on  08/17/2014 FL2 transmitted to all facilities within larger geographic area on 08/17/2014  Patient informed that his/her managed care company has contracts with or will negotiate with  certain facilities, including the following:     Patient/family informed of bed offers received:   Patient chooses bed at  Physician recommends and patient chooses bed at    Patient to be transferred to  on   Patient to be transferred to facility by  Patient and family notified of transfer on  Name of family member notified:    The following physician request were entered in Epic:   Additional Comments:   Kingsley Spittle, [place] Intern,  QN:[id number]

## 2014-08-17 NOTE — Progress Notes (Signed)
[name], female   DOB: 1955/11/09, 59 y.o.   MRN: [id number] S: no new complaints O:BP 145/71 mmHg  Pulse 91  Temp(Src) 98.3 F (36.8 C) (Oral)  Resp 20  Ht 5' 7.5" (1.715 m)  Wt 90.22 kg (198 lb 14.4 oz)  BMI 30.67 kg/m2  SpO2 97%  LMP 03/16/2002  Intake/Output Summary (Last 24 hours) at 08/17/14 1127 Last data filed at 08/17/14 1043  Gross per 24 hour  Intake 2111.67 ml  Output    350 ml  Net 1761.67 ml   Intake/Output: I/O last 3 completed shifts: In: [id number] [P.O.:800; I.V.:2320; IV Piggyback:250] Out: 400 [Urine:400]  Intake/Output this shift:  Total I/O In: 491.7 [P.O.:120; I.V.:371.7] Out: -  Weight change:  Gen:WD WN AAF in NAD CVS:no rub Resp:decreased BS at bases Abd:+BS, soft, NT Ext:2+ edema   Recent Labs Lab 08/11/14 0700 08/12/14 0522 08/13/14 0809 08/14/14 0651 08/15/14 1511 08/16/14 0500 08/17/14 0525  NA 133* 136 133* 132* 127* 127* 128*  K 3.4* 3.6 3.4* 3.8 3.4* 3.5 3.9  CL 99 100 98 96 96 94* 97  CO2 26 26 24 26 24 22  17*  GLUCOSE 158* 112* 164* 178* 217* 205* 121*  BUN 27* 21 23 31* 41* 46* 54*  CREATININE 1.54* 1.42* 1.35* 1.69* 3.11* 4.12* 5.28*  ALBUMIN  --  1.1*  --   --   --  1.1*  --   CALCIUM 7.8* 8.0* 8.2* 8.0* 7.9* 7.8* 7.7*  PHOS  --   --   --   --   --  5.9*  --   AST  --  27  --   --   --   --   --   ALT  --  16  --   --   --   --   --    Liver Function Tests:  Recent Labs Lab 08/12/14 0522 08/16/14 0500  AST 27  --   ALT 16  --   ALKPHOS 97  --   BILITOT 0.4  --   PROT 5.7*  --   ALBUMIN 1.1* 1.1*   No results for input(s): LIPASE, AMYLASE in the last 168 hours. No results for input(s): AMMONIA in the last 168 hours. CBC:  Recent Labs Lab 08/13/14 0809 08/14/14 0651 08/15/14 1511 08/16/14 0550 08/17/14 0525  WBC 19.1* 19.7* 13.2* 12.4* 11.1*  NEUTROABS  --   --  10.6*  --   --   HGB 9.9* 9.0* 8.1* 8.0* 8.3*  HCT 29.6* 27.1* 24.0* 24.1* 24.5*  MCV 92.2 92.2 91.3 89.6 89.1  PLT 308 348 349  374 427*   Cardiac Enzymes:  Recent Labs Lab 08/16/14 1330  CKTOTAL 45   CBG:  Recent Labs Lab 08/16/14 0758 08/16/14 1202 08/16/14 1722 08/16/14 2226 08/17/14 0756  GLUCAP 191* 157* 210* 132* 119*    Iron Studies: No results for input(s): IRON, TIBC, TRANSFERRIN, FERRITIN in the last 72 hours. Studies/Results: US Renal  08/15/2014   CLINICAL DATA:  Acute renal failure.  EXAM: RENAL/URINARY TRACT ULTRASOUND COMPLETE  COMPARISON:  None.  FINDINGS: Right Kidney:  Length: 11.6 cm. Increased echogenicity of the parenchyma consistent with renal medical disease. No hydronephrosis or mass lesion or thinning of the renal cortex.  Left Kidney:  Length: 11.9 cm. Increased echogenicity of the renal parenchyma with no thinning of the cortex and no hydronephrosis or mass.  Bladder:  Normal.  Incidental note is made of small bilateral pleural effusions.  IMPRESSION:  Increased echogenicity of the renal parenchyma both kidneys consistent with renal medical disease. Small bilateral pleural effusions.   Electronically Signed   By: Lorriane Shire M.D.   On: 08/15/2014 20:08   . amLODipine  5 mg Oral Daily  . antiseptic oral rinse  7 mL Mouth Rinse q12n4p  . aspirin  81 mg Oral Daily  .  ceFAZolin (ANCEF) IV  2 g Intravenous Q12H  . chlorhexidine  15 mL Mouth Rinse BID  . cyclobenzaprine  5 mg Oral BID  . famotidine  20 mg Oral Daily  . ferrous gluconate  324 mg Oral Q breakfast  . heparin subcutaneous  5,000 Units Subcutaneous 3 times per day  . hydrALAZINE  25 mg Oral TID  . insulin aspart  0-9 Units Subcutaneous TID WC  . nicotine  21 mg Transdermal Daily  . polyethylene glycol  17 g Oral BID  . sodium chloride  3 mL Intravenous Q12H    BMET    Component Value Date/Time   NA 128* 08/17/2014 0525   K 3.9 08/17/2014 0525   CL 97 08/17/2014 0525   CO2 17* 08/17/2014 0525   GLUCOSE 121* 08/17/2014 0525   BUN 54* 08/17/2014 0525   CREATININE 5.28* 08/17/2014 0525   CREATININE 1.15*  02/27/2014 1203   CALCIUM 7.7* 08/17/2014 0525   GFRNONAA 8* 08/17/2014 0525   GFRNONAA 52* 02/27/2014 1203   GFRAA 9* 08/17/2014 0525   GFRAA 60 02/27/2014 1203   CBC    Component Value Date/Time   WBC 11.1* 08/17/2014 0525   RBC 2.75* 08/17/2014 0525   RBC 3.77* 08/07/2014 2114   HGB 8.3* 08/17/2014 0525   HCT 24.5* 08/17/2014 0525   PLT 427* 08/17/2014 0525   MCV 89.1 08/17/2014 0525   MCH 30.2 08/17/2014 0525   MCHC 33.9 08/17/2014 0525   RDW 13.4 08/17/2014 0525   LYMPHSABS 1.6 08/15/2014 1511   MONOABS 0.9 08/15/2014 1511   EOSABS 0.1 08/15/2014 1511   BASOSABS 0.0 08/15/2014 1511     Assessment/Plan: 1. 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 Will also check tox screen to r/o cocaine use (levimasole associated with ANCA-+ vasculitis) 1. Discussed risks and benefits and she is amenable for a renal biopsy. Will consult IR for renal biopsy and HD cath (untunneled if Scr continues to worsen) 2. Still awaiting serologies 3. Will add lasix given her volume overload. 2. MSSA polyarticular septic arthritis - without underlying source of infection (negative TEE). ID following 3. HTN- stable 4. Anemia of chronic disease- will start aranesp and check iron stores 5. Hyponatremia- related to AKI and edema.  Add lasix to IVF"s and follow. 6. DM- historically poorly controlled. Per primary svc  Rc Amison A

## 2014-08-17 NOTE — Progress Notes (Signed)
[facility] PROGRESS NOTE  Brandy Houston [id number] DOB: 1955-06-26 DOA: 08/07/2014 PCP: Chari Manning, NP  Assessment/Plan: MSSA bacteremia with sepsis and Polyarticular septic arthrits:Initally admitted with acute left shoulder pain, further work up with a MRI of left shoulder demonstrated a septic AC joint.Subsequent blood cultures positive for MSSA. ID and Orthopedics were consulted. Blood cultures on 2/24 was also positive for MSSA. Patient meanwhile started having pain/swelling of her B/L knee joints, Right should joint as well. Further workup demonstarted septic arthritis of these joints.Patient subsequently underwent irrigation and debridement of Right knee, left knee, Right AC & left AC joints, and Irrigation and debridement of right hand flexor tendon sheath. Underwent TEE on 2/29 that was negative. Infectious disease recently recommended switching from Ancef to nafcillin. With worsening renal function, ID has changed abx back to ancef. Recommendations for US guided arthrocentesis of L knee. Discussed case with Radiology, who states joint aspiration normally not done in radiology.    Anemia:secondary to acute illness/CKD. Hgb stable overnight but remains low. Normocytic picture. Follow CBC   DM-2:CBG's controlled with SSI. Had stopped Glipizide with plans to resume on discharge. A1c 8.4.   HTN: Overall remains stable with hydralazine and amlodpine   Acute on CKD Stage 3: Cr trending up over the past 48hrs. Concerns for possible nephrotic syndrome. UA with >300 protein with increasing ankle edema in the setting of HCV and active staph infection.  Appreciate input by Nephrology. IR consulted for renal biopsy planned. HD cath planned if Cr continues to worsen   Tobacco Abuse:transdermal nicotine, was counseled  Code Status: Full Family Communication: Pt in room  Disposition Plan:  Pending   Consultants:  Nephrology  ID  CIR  Procedures:    Antibiotics:  Nafcillin 3/1>>>3/2  Ancef 2/29>>>3/1, resumed 3/2  HPI/Subjective: Without acute events noted overnight. Reports little urine output overnight  Objective: Filed Vitals:   08/16/14 1702 08/16/14 2222 08/17/14 0553 08/17/14 1420  BP: 145/69 146/65 145/71 144/70  Pulse:  93 91 93  Temp: 98.5 F (36.9 C) 98.7 F (37.1 C) 98.3 F (36.8 C) 98.4 F (36.9 C)  TempSrc: Oral Oral Oral Oral  Resp: 16 16 20 20   Height:      Weight:      SpO2: 99% 98% 97% 100%    Intake/Output Summary (Last 24 hours) at 08/17/14 1637 Last data filed at 08/17/14 1631  Gross per 24 hour  Intake 2598.34 ml  Output    315 ml  Net 2283.34 ml   Filed Weights   08/07/14 2009  Weight: 90.22 kg (198 lb 14.4 oz)    Exam:   General:  Awake, in nad  Cardiovascular: regular, s1, s2  Respiratory: normal resp effort, no wheezing  Abdomen: soft,nondistended  Musculoskeletal: perfused, B pitting LE edema   Data Reviewed: Basic Metabolic Panel:  Recent Labs Lab 08/13/14 0809 08/14/14 0651 08/15/14 1511 08/16/14 0500 08/17/14 0525  NA 133* 132* 127* 127* 128*  K 3.4* 3.8 3.4* 3.5 3.9  CL 98 96 96 94* 97  CO2 24 26 24 22  17*  GLUCOSE 164* 178* 217* 205* 121*  BUN 23 31* 41* 46* 54*  CREATININE 1.35* 1.69* 3.11* 4.12* 5.28*  CALCIUM 8.2* 8.0* 7.9* 7.8* 7.7*  PHOS  --   --   --  5.9*  --    Liver Function Tests:  Recent Labs Lab 08/12/14 0522 08/16/14 0500  AST 27  --   ALT 16  --   ALKPHOS 97  --  BILITOT 0.4  --   PROT 5.7*  --   ALBUMIN 1.1* 1.1*   No results for input(s): LIPASE, AMYLASE in the last 168 hours. No results for input(s): AMMONIA in the last 168 hours. CBC:  Recent Labs Lab 08/13/14 0809 08/14/14 0651 08/15/14 1511 08/16/14 0550 08/17/14 0525  WBC 19.1* 19.7* 13.2* 12.4* 11.1*  NEUTROABS  --   --  10.6*  --   --   HGB 9.9* 9.0* 8.1* 8.0* 8.3*  HCT 29.6* 27.1* 24.0*  24.1* 24.5*  MCV 92.2 92.2 91.3 89.6 89.1  PLT 308 348 349 374 427*   Cardiac Enzymes:  Recent Labs Lab 08/16/14 1330  CKTOTAL 45   BNP (last 3 results) No results for input(s): BNP in the last 8760 hours.  ProBNP (last 3 results) No results for input(s): PROBNP in the last 8760 hours.  CBG:  Recent Labs Lab 08/16/14 1202 08/16/14 1722 08/16/14 2226 08/17/14 0756 08/17/14 1154  GLUCAP 157* 210* 132* 119* 136*    Recent Results (from the past 240 hour(s))  Culture, blood (routine x 2)     Status: None   Collection Time: 08/09/14 12:15 PM  Result Value Ref Range Status   Specimen Description BLOOD RIGHT HAND  Final   Special Requests   Final    BOTTLES DRAWN AEROBIC AND ANAEROBIC 7CCS BOTH BOTTLES   Culture   Final    STAPHYLOCOCCUS AUREUS Note: RIFAMPIN AND GENTAMICIN SHOULD NOT BE USED AS SINGLE DRUGS FOR TREATMENT OF STAPH INFECTIONS. Note: Gram Stain Report Called to,Read Back By and Verified With: Shirlean Kelly 700AM 08/10/14 [place] Performed at [facility]    Report Status 08/12/2014 FINAL  Final   Organism ID, Bacteria STAPHYLOCOCCUS AUREUS  Final      Susceptibility   Staphylococcus aureus - MIC*    CLINDAMYCIN <=0.25 SENSITIVE Sensitive     ERYTHROMYCIN <=0.25 SENSITIVE Sensitive     GENTAMICIN <=0.5 SENSITIVE Sensitive     LEVOFLOXACIN 0.25 SENSITIVE Sensitive     OXACILLIN <=0.25 SENSITIVE Sensitive     PENICILLIN >=0.5 RESISTANT Resistant     RIFAMPIN <=0.5 SENSITIVE Sensitive     TRIMETH/SULFA <=10 SENSITIVE Sensitive     VANCOMYCIN 1 SENSITIVE Sensitive     TETRACYCLINE <=1 SENSITIVE Sensitive     MOXIFLOXACIN <=0.25 SENSITIVE Sensitive     * STAPHYLOCOCCUS AUREUS  Culture, blood (routine x 2)     Status: None   Collection Time: 08/09/14 12:25 PM  Result Value Ref Range Status   Specimen Description BLOOD RIGHT ARM  Final   Special Requests   Final    BOTTLES DRAWN AEROBIC AND ANAEROBIC 10CC BOTH BOTTLES   Culture   Final     STAPHYLOCOCCUS AUREUS Note: SUSCEPTIBILITIES PERFORMED ON PREVIOUS CULTURE WITHIN THE LAST 5 DAYS. Note: Gram Stain Report Called to,Read Back By and Verified With: Shirlean Kelly 700AM 08/10/14 [place] Performed at [facility]    Report Status 08/12/2014 FINAL  Final  Culture, routine-abscess     Status: None   Collection Time: 08/10/14 11:54 AM  Result Value Ref Range Status   Specimen Description SHOULDER LEFT  Final   Special Requests NONE  Final   Gram Stain   Final    ABUNDANT WBC PRESENT, PREDOMINANTLY PMN NO SQUAMOUS EPITHELIAL CELLS SEEN MODERATE GRAM POSITIVE COCCI IN PAIRS IN CLUSTERS Performed at [facility]    Culture   Final    MODERATE STAPHYLOCOCCUS AUREUS Note: RIFAMPIN AND GENTAMICIN SHOULD  NOT BE USED AS SINGLE DRUGS FOR TREATMENT OF STAPH INFECTIONS. Performed at [facility]    Report Status 08/13/2014 FINAL  Final   Organism ID, Bacteria STAPHYLOCOCCUS AUREUS  Final      Susceptibility   Staphylococcus aureus - MIC*    CLINDAMYCIN <=0.25 SENSITIVE Sensitive     ERYTHROMYCIN <=0.25 SENSITIVE Sensitive     GENTAMICIN <=0.5 SENSITIVE Sensitive     LEVOFLOXACIN 0.25 SENSITIVE Sensitive     OXACILLIN 0.5 SENSITIVE Sensitive     PENICILLIN >=0.5 RESISTANT Resistant     RIFAMPIN <=0.5 SENSITIVE Sensitive     TRIMETH/SULFA <=10 SENSITIVE Sensitive     VANCOMYCIN 1 SENSITIVE Sensitive     TETRACYCLINE <=1 SENSITIVE Sensitive     MOXIFLOXACIN <=0.25 SENSITIVE Sensitive     * MODERATE STAPHYLOCOCCUS AUREUS  Anaerobic culture     Status: None (Preliminary result)   Collection Time: 08/10/14 11:54 AM  Result Value Ref Range Status   Specimen Description SHOULDER LEFT  Final   Special Requests NONE  Final   Gram Stain   Final    MODERATE WBC PRESENT, PREDOMINANTLY PMN NO SQUAMOUS EPITHELIAL CELLS SEEN MODERATE GRAM POSITIVE COCCI IN PAIRS IN CLUSTERS Performed at [facility]    Culture   Final    No Anaerobes Isolated;  Culture in Progress for 14 DAYS Performed at [facility]    Report Status PENDING  Incomplete  Body fluid culture     Status: None   Collection Time: 08/10/14  5:22 PM  Result Value Ref Range Status   Specimen Description SYNOVIAL JOINT RIGHT KNEE  Final   Special Requests Normal  Final   Gram Stain   Final    ABUNDANT WBC PRESENT,BOTH PMN AND MONONUCLEAR NO ORGANISMS SEEN Performed at [facility]    Culture   Final    FEW STAPHYLOCOCCUS AUREUS Note: RIFAMPIN AND GENTAMICIN SHOULD NOT BE USED AS SINGLE DRUGS FOR TREATMENT OF STAPH INFECTIONS. CRITICAL RESULT CALLED TO, READ BACK BY AND VERIFIED WITH: CINDY FLORES RN 08/13/14 AT 13 AM BY [facility] Performed at [facility]    Report Status 08/14/2014 FINAL  Final   Organism ID, Bacteria STAPHYLOCOCCUS AUREUS  Final      Susceptibility   Staphylococcus aureus - MIC*    CLINDAMYCIN <=0.25 SENSITIVE Sensitive     ERYTHROMYCIN <=0.25 SENSITIVE Sensitive     GENTAMICIN <=0.5 SENSITIVE Sensitive     LEVOFLOXACIN 0.25 SENSITIVE Sensitive     OXACILLIN 0.5 SENSITIVE Sensitive     PENICILLIN >=0.5 RESISTANT Resistant     RIFAMPIN <=0.5 SENSITIVE Sensitive     TRIMETH/SULFA <=10 SENSITIVE Sensitive     VANCOMYCIN <=0.5 SENSITIVE Sensitive     TETRACYCLINE <=1 SENSITIVE Sensitive     MOXIFLOXACIN <=0.25 SENSITIVE Sensitive     * FEW STAPHYLOCOCCUS AUREUS  Surgical pcr screen     Status: Abnormal   Collection Time: 08/10/14  6:42 PM  Result Value Ref Range Status   MRSA, PCR NEGATIVE NEGATIVE Final   Staphylococcus aureus POSITIVE (A) NEGATIVE Final    Comment:        The Xpert SA Assay (FDA approved for NASAL specimens in patients over 69 years of age), is one component of a comprehensive surveillance program.  Test performance has been validated by [facility] for patients greater than or equal to 29 year old. It is not intended to diagnose infection nor to guide or monitor treatment.   Body fluid  culture     Status: None   Collection Time: 08/10/14  9:14 PM  Result Value Ref Range Status   Specimen Description FLUID LEFT AC JOINT  Final   Special Requests VANCO, ROCEPHIN  Final   Gram Stain   Final    FEW WBC PRESENT, PREDOMINANTLY PMN MODERATE GRAM POSITIVE COCCI IN PAIRS IN CLUSTERS Gram Stain Report Called to,Read Back By and Verified With: Gram Stain Report Called to,Read Back By and Verified With: Laurance Flatten RN 08/11/13 9:10AM BY [place] Performed at [facility]    Culture   Final    ABUNDANT STAPHYLOCOCCUS AUREUS Note: RIFAMPIN AND GENTAMICIN SHOULD NOT BE USED AS SINGLE DRUGS FOR TREATMENT OF STAPH INFECTIONS. Performed at [facility]    Report Status 08/13/2014 FINAL  Final   Organism ID, Bacteria STAPHYLOCOCCUS AUREUS  Final      Susceptibility   Staphylococcus aureus - MIC*    CLINDAMYCIN <=0.25 SENSITIVE Sensitive     ERYTHROMYCIN <=0.25 SENSITIVE Sensitive     GENTAMICIN <=0.5 SENSITIVE Sensitive     LEVOFLOXACIN 0.25 SENSITIVE Sensitive     OXACILLIN <=0.25 SENSITIVE Sensitive     PENICILLIN >=0.5 RESISTANT Resistant     RIFAMPIN <=0.5 SENSITIVE Sensitive     TRIMETH/SULFA <=10 SENSITIVE Sensitive     VANCOMYCIN 1 SENSITIVE Sensitive     TETRACYCLINE <=1 SENSITIVE Sensitive     MOXIFLOXACIN <=0.25 SENSITIVE Sensitive     * ABUNDANT STAPHYLOCOCCUS AUREUS  Anaerobic culture     Status: None   Collection Time: 08/10/14  9:14 PM  Result Value Ref Range Status   Specimen Description FLUID LEFT AC JOINT  Final   Special Requests VANCO, ROCEPHIN  Final   Gram Stain   Final    FEW WBC PRESENT, PREDOMINANTLY PMN MODERATE GRAM POSITIVE COCCI IN PAIRS IN CLUSTERS Performed at [facility]    Culture   Final    NO ANAEROBES ISOLATED Performed at [facility]    Report Status 08/15/2014 FINAL  Final  Culture, blood (routine x 2)     Status: None   Collection Time: 08/11/14  7:00 AM  Result Value Ref Range Status   Specimen  Description BLOOD RIGHT ARM  Final   Special Requests BOTTLES DRAWN AEROBIC AND ANAEROBIC 6CC  Final   Culture   Final    NO GROWTH 5 DAYS Performed at [facility]    Report Status 08/17/2014 FINAL  Final  Culture, blood (routine x 2)     Status: None   Collection Time: 08/11/14  7:08 AM  Result Value Ref Range Status   Specimen Description BLOOD LEFT HAND  Final   Special Requests BOTTLES DRAWN AEROBIC AND ANAEROBIC [place]  Final   Culture   Final    NO GROWTH 5 DAYS Performed at [facility]    Report Status 08/17/2014 FINAL  Final  Culture, Urine     Status: None   Collection Time: 08/12/14  5:47 AM  Result Value Ref Range Status   Specimen Description URINE, RANDOM  Final   Special Requests NONE  Final   Colony Count   Final    3,000 COLONIES/ML Performed at [facility]    Culture   Final    INSIGNIFICANT GROWTH Performed at [facility]    Report Status 08/13/2014 FINAL  Final  Culture, blood (routine x 2)     Status: None (Preliminary result)   Collection Time: 08/15/14  1:15  PM  Result Value Ref Range Status   Specimen Description BLOOD RIGHT HAND  Final   Special Requests BOTTLES DRAWN AEROBIC ONLY 3CC  Final   Culture   Final           BLOOD CULTURE RECEIVED NO GROWTH TO DATE CULTURE WILL BE HELD FOR 5 DAYS BEFORE ISSUING A FINAL NEGATIVE REPORT Performed at [facility]    Report Status PENDING  Incomplete  Culture, blood (routine x 2)     Status: None (Preliminary result)   Collection Time: 08/15/14  3:11 PM  Result Value Ref Range Status   Specimen Description BLOOD RIGHT HAND  Final   Special Requests   Final    BOTTLES DRAWN AEROBIC AND ANAEROBIC BLUE 10CC RED 5CC   Culture   Final           BLOOD CULTURE RECEIVED NO GROWTH TO DATE CULTURE WILL BE HELD FOR 5 DAYS BEFORE ISSUING A FINAL NEGATIVE REPORT Performed at [facility]    Report Status PENDING  Incomplete     Studies: US Renal  08/15/2014    CLINICAL DATA:  Acute renal failure.  EXAM: RENAL/URINARY TRACT ULTRASOUND COMPLETE  COMPARISON:  None.  FINDINGS: Right Kidney:  Length: 11.6 cm. Increased echogenicity of the parenchyma consistent with renal medical disease. No hydronephrosis or mass lesion or thinning of the renal cortex.  Left Kidney:  Length: 11.9 cm. Increased echogenicity of the renal parenchyma with no thinning of the cortex and no hydronephrosis or mass.  Bladder:  Normal.  Incidental note is made of small bilateral pleural effusions.  IMPRESSION: Increased echogenicity of the renal parenchyma both kidneys consistent with renal medical disease. Small bilateral pleural effusions.   Electronically Signed   By: Lorriane Shire M.D.   On: 08/15/2014 20:08    Scheduled Meds: . amLODipine  5 mg Oral Daily  . antiseptic oral rinse  7 mL Mouth Rinse q12n4p  . aspirin  81 mg Oral Daily  .  ceFAZolin (ANCEF) IV  2 g Intravenous Q12H  . chlorhexidine  15 mL Mouth Rinse BID  . cyclobenzaprine  5 mg Oral BID  . famotidine  20 mg Oral Daily  . ferrous gluconate  324 mg Oral Q breakfast  . heparin subcutaneous  5,000 Units Subcutaneous 3 times per day  . hydrALAZINE  25 mg Oral TID  . insulin aspart  0-9 Units Subcutaneous TID WC  . nicotine  21 mg Transdermal Daily  . polyethylene glycol  17 g Oral BID  . sodium chloride  3 mL Intravenous Q12H   Continuous Infusions: . sodium chloride 100 mL/hr at 08/17/14 [id number]    Principal Problem:   Staphylococcus aureus bacteremia Active Problems:   Septic joint of left shoulder region   Sinus tachycardia   Septic joint of right knee joint   CKD (chronic kidney disease) stage 3, GFR 30-59 ml/min   Septic joint of right hand   SIRS (systemic inflammatory response syndrome)   Benign essential HTN   Hepatitis C   Acute renal failure    [facility], [facility] Pager [phone]. If 7PM-7AM, please contact night-coverage at [email or web address], password [facility] 08/17/2014, 4:37 PM   LOS: 10 days

## 2014-08-17 NOTE — Clinical Social Work Psychosocial (Signed)
Clinical Social Work Department BRIEF PSYCHOSOCIAL ASSESSMENT 08/17/2014  Patient:  Brandy Houston, Brandy Houston     Account Number:  [id number]     Admit date:  08/07/2014  Clinical Social Worker:  Kingsley Spittle, CLINICAL SOCIAL WORKER  Date/Time:  08/17/2014 03:15 PM  Referred by:  Physician  Date Referred:  08/17/2014 Referred for  SNF Placement   Other Referral:   N/A   Interview type:  Patient Other interview type:   N/A    PSYCHOSOCIAL DATA Living Status:  FAMILY Admitted from facility:   Level of care:   Primary support name:  [name] Primary support relationship to patient:  CHILD, ADULT Degree of support available:   Support is good.    CURRENT CONCERNS Current Concerns  Post-Acute Placement   Other Concerns:   N/A    SOCIAL WORK ASSESSMENT / PLAN BSW intern spoke with patient at bedside about her options for discharge. We discussed the possibility of CIR being unable to give her a bed offer and if she would be agreeable to SNF if that were to happen. The patient seemed concerned that she would have to go to a SNF and did not seem to pleased with that option. However, the patient was agreeable to fax out her information to [facility] because she lives with her son and she does not want to rely on him to take care of her.   Assessment/plan status:  Psychosocial Support/Ongoing Assessment of Needs Other assessment/ plan:   N/A   Information/referral to community resources:   BSW contact information given to patient.    PATIENT'S/FAMILY'S RESPONSE TO PLAN OF CARE: Patient was agreeable to SNF placement if CIR was not an option. Patient seemed concerned about getting better for the sake of her son and the well-being of her family. Ongoing assessment needed.       Kingsley Spittle, [place] Intern, [phone]

## 2014-08-18 ENCOUNTER — Inpatient Hospital Stay (HOSPITAL_COMMUNITY): Payer: Medicaid Other

## 2014-08-18 DIAGNOSIS — B182 Chronic viral hepatitis C: Secondary | ICD-10-CM

## 2014-08-18 LAB — BASIC METABOLIC PANEL
Anion gap: 13 (ref 5–15)
Anion gap: 13 (ref 5–15)
BUN: 62 mg/dL — ABNORMAL HIGH (ref 6–23)
BUN: 44 mg/dL — ABNORMAL HIGH (ref 6–23)
CO2: 15 mmol/L — ABNORMAL LOW (ref 19–32)
CO2: 18 mmol/L — ABNORMAL LOW (ref 19–32)
Calcium: 7.4 mg/dL — ABNORMAL LOW (ref 8.4–10.5)
Calcium: 7.6 mg/dL — ABNORMAL LOW (ref 8.4–10.5)
Chloride: 100 mmol/L (ref 96–112)
Chloride: 100 mmol/L (ref 96–112)
Creatinine, Ser: 6.54 mg/dL — ABNORMAL HIGH (ref 0.50–1.10)
Creatinine, Ser: 5.25 mg/dL — ABNORMAL HIGH (ref 0.50–1.10)
GFR calc Af Amer: 7 mL/min — ABNORMAL LOW (ref >90)
GFR calc Af Amer: 9 mL/min — ABNORMAL LOW (ref >90)
GFR calc non Af Amer: 6 mL/min — ABNORMAL LOW (ref >90)
GFR calc non Af Amer: 8 mL/min — ABNORMAL LOW (ref >90)
Glucose, Bld: 110 mg/dL — ABNORMAL HIGH (ref 70–99)
Glucose, Bld: 97 mg/dL (ref 70–99)
Potassium: 4 mmol/L (ref 3.5–5.1)
Potassium: 3.8 mmol/L (ref 3.5–5.1)
Sodium: 128 mmol/L — ABNORMAL LOW (ref 135–145)
Sodium: 131 mmol/L — ABNORMAL LOW (ref 135–145)

## 2014-08-18 LAB — PROTEIN ELECTROPHORESIS, SERUM
Albumin ELP: 25.6 % — ABNORMAL LOW (ref 55.8–66.1)
Alpha-1-Globulin: 12.6 % — ABNORMAL HIGH (ref 2.9–4.9)
Alpha-2-Globulin: 19.7 % — ABNORMAL HIGH (ref 7.1–11.8)
Beta 2: 5.7 % (ref 3.2–6.5)
Beta Globulin: 4.1 % — ABNORMAL LOW (ref 4.7–7.2)
Gamma Globulin: 32.3 % — ABNORMAL HIGH (ref 11.1–18.8)
M-Spike, %: NOT DETECTED g/dL
Total Protein ELP: 6.3 g/dL (ref 6.0–8.3)

## 2014-08-18 LAB — CBC WITH DIFFERENTIAL/PLATELET
Basophils Absolute: 0 10*3/uL (ref 0.0–0.1)
Basophils Relative: 0 % (ref 0–1)
Eosinophils Absolute: 0.1 10*3/uL (ref 0.0–0.7)
Eosinophils Relative: 1 % (ref 0–5)
HCT: 23.1 % — ABNORMAL LOW (ref 36.0–46.0)
Hemoglobin: 8.1 g/dL — ABNORMAL LOW (ref 12.0–15.0)
Lymphocytes Relative: 13 % (ref 12–46)
Lymphs Abs: 1.7 10*3/uL (ref 0.7–4.0)
MCH: 31.4 pg (ref 26.0–34.0)
MCHC: 35.1 g/dL (ref 30.0–36.0)
MCV: 89.5 fL (ref 78.0–100.0)
Monocytes Absolute: 1.2 10*3/uL — ABNORMAL HIGH (ref 0.1–1.0)
Monocytes Relative: 9 % (ref 3–12)
Neutro Abs: 9.6 10*3/uL — ABNORMAL HIGH (ref 1.7–7.7)
Neutrophils Relative %: 77 % (ref 43–77)
Platelets: 508 10*3/uL — ABNORMAL HIGH (ref 150–400)
RBC: 2.58 MIL/uL — ABNORMAL LOW (ref 3.87–5.11)
RDW: 13.9 % (ref 11.5–15.5)
WBC: 12.5 10*3/uL — ABNORMAL HIGH (ref 4.0–10.5)

## 2014-08-18 LAB — SYNOVIAL CELL COUNT + DIFF, W/ CRYSTALS
Crystals, Fluid: NONE SEEN
Eosinophils-Synovial: 0 % (ref 0–1)
Lymphocytes-Synovial Fld: 1 % (ref 0–20)
Monocyte-Macrophage-Synovial Fluid: 5 % — ABNORMAL LOW (ref 50–90)
Neutrophil, Synovial: 94 % — ABNORMAL HIGH (ref 0–25)
WBC, Synovial: 32800 /mm3 — ABNORMAL HIGH (ref 0–200)

## 2014-08-18 LAB — UIFE/LIGHT CHAINS/TP QN, 24-HR UR
Albumin, U: DETECTED
Alpha 1, Urine: DETECTED — AB
Alpha 2, Urine: DETECTED — AB
Beta, Urine: DETECTED — AB
Gamma Globulin, Urine: DETECTED — AB
Total Protein, Urine: 617 mg/dL — ABNORMAL HIGH (ref 5–24)

## 2014-08-18 LAB — GLUCOSE, CAPILLARY
Glucose-Capillary: 115 mg/dL — ABNORMAL HIGH (ref 70–99)
Glucose-Capillary: 107 mg/dL — ABNORMAL HIGH (ref 70–99)
Glucose-Capillary: 116 mg/dL — ABNORMAL HIGH (ref 70–99)
Glucose-Capillary: 101 mg/dL — ABNORMAL HIGH (ref 70–99)

## 2014-08-18 LAB — URINE CULTURE
Colony Count: NO GROWTH
Culture: NO GROWTH

## 2014-08-18 MED ORDER — ALTEPLASE 2 MG IJ SOLR
2.0000 mg | Freq: Once | INTRAMUSCULAR | Status: DC | PRN
  Filled 2014-08-18: qty 2

## 2014-08-18 MED ORDER — LIDOCAINE-PRILOCAINE 2.5-2.5 % EX CREA
1.0000 "application " | TOPICAL_CREAM | CUTANEOUS | Status: DC | PRN
  Filled 2014-08-18: qty 5

## 2014-08-18 MED ORDER — HEPARIN SODIUM (PORCINE) 1000 UNIT/ML IJ SOLN
INTRAMUSCULAR | Status: AC
  Filled 2014-08-18: qty 1

## 2014-08-18 MED ORDER — FENTANYL CITRATE 0.05 MG/ML IJ SOLN
INTRAMUSCULAR | Status: AC
  Filled 2014-08-18: qty 4

## 2014-08-18 MED ORDER — LIDOCAINE HCL 1 % IJ SOLN
INTRAMUSCULAR | Status: AC
  Filled 2014-08-18: qty 20

## 2014-08-18 MED ORDER — SODIUM CHLORIDE 0.9 % IV SOLN: 100.0000 mL | INTRAVENOUS | Status: DC | PRN

## 2014-08-18 MED ORDER — CEFAZOLIN SODIUM-DEXTROSE 2-3 GM-% IV SOLR
2.0000 g | Freq: Two times a day (BID) | INTRAVENOUS | Status: DC
  Administered 2014-08-19 – 2014-08-27 (×18): 2 g via INTRAVENOUS
  Filled 2014-08-18 (×21): qty 50

## 2014-08-18 MED ORDER — HYDROCODONE-ACETAMINOPHEN 5-325 MG PO TABS
ORAL_TABLET | ORAL | Status: AC
  Filled 2014-08-18: qty 2

## 2014-08-18 MED ORDER — CEFAZOLIN SODIUM-DEXTROSE 2-3 GM-% IV SOLR
2.0000 g | Freq: Two times a day (BID) | INTRAVENOUS | Status: DC
  Filled 2014-08-18 (×2): qty 50

## 2014-08-18 MED ORDER — MIDAZOLAM HCL 2 MG/2ML IJ SOLN
INTRAMUSCULAR | Status: AC
Start: 2014-08-18 — End: 2014-08-19
  Filled 2014-08-18: qty 4

## 2014-08-18 MED ORDER — LIDOCAINE HCL (PF) 1 % IJ SOLN: 5.0000 mL | INTRAMUSCULAR | Status: DC | PRN

## 2014-08-18 MED ORDER — CEFAZOLIN SODIUM-DEXTROSE 2-3 GM-% IV SOLR
INTRAVENOUS | Status: AC
Start: 2014-08-18 — End: 2014-08-19
  Filled 2014-08-18: qty 50

## 2014-08-18 MED ORDER — NEPRO/CARBSTEADY PO LIQD
237.0000 mL | ORAL | Status: DC | PRN
  Filled 2014-08-18: qty 237

## 2014-08-18 MED ORDER — PENTAFLUOROPROP-TETRAFLUOROETH EX AERO: 1.0000 "application " | INHALATION_SPRAY | CUTANEOUS | Status: DC | PRN

## 2014-08-18 MED ORDER — FENTANYL CITRATE 0.05 MG/ML IJ SOLN
INTRAMUSCULAR | Status: AC | PRN
  Administered 2014-08-18: 50 ug via INTRAVENOUS

## 2014-08-18 MED ORDER — HYDROMORPHONE HCL 1 MG/ML IJ SOLN
1.0000 mg | Freq: Four times a day (QID) | INTRAMUSCULAR | Status: DC | PRN
  Administered 2014-08-18 – 2014-08-19 (×4): 1 mg via INTRAVENOUS
  Filled 2014-08-18 (×5): qty 1

## 2014-08-18 MED ORDER — MIDAZOLAM HCL 2 MG/2ML IJ SOLN
INTRAMUSCULAR | Status: AC | PRN
  Administered 2014-08-18: 1 mg via INTRAVENOUS

## 2014-08-18 NOTE — Progress Notes (Signed)
PT Cancellation Note  Patient Details Name: Brandy Houston MRN: [id number] DOB: [birth year]   Cancelled Treatment:    Reason Eval/Treat Not Completed: Patient at procedure or test/unavailable.  Pt has been at procedure this afternoon and will be on bedrest afterward.  Will defer treatment today. 08/18/2014  Donnella Sham, [place] [phone]  (pager)   [name], [name] 08/18/2014, 4:29 PM

## 2014-08-18 NOTE — Progress Notes (Addendum)
[facility] PROGRESS NOTE  Brandy Houston [id number] DOB: Oct 18, 1955 DOA: 08/07/2014 PCP: Chari Manning, NP  Assessment/Plan: MSSA bacteremia with sepsis and Polyarticular septic arthrits:Initally admitted with acute left shoulder pain, further work up with a MRI of left shoulder demonstrated a septic AC joint.Subsequent blood cultures positive for MSSA. ID and Orthopedics were consulted. Blood cultures on 2/24 was also positive for MSSA. Patient meanwhile started having pain/swelling of her B/L knee joints, Right should joint as well. Further workup demonstarted septic arthritis of these joints.Patient subsequently underwent irrigation and debridement of Right knee, left knee, Right AC & left AC joints, and Irrigation and debridement of right hand flexor tendon sheath. Underwent TEE on 2/29 that was negative. Infectious disease recently recommended switching from Ancef to nafcillin. With worsening renal function, ID has changed abx back to ancef. Recommendations for US guided arthrocentesis of L knee. Discussed case with Radiology, who states joint aspiration normally not done in radiology. Discussed case with Orthopedic Surgery who will see in follow up.    Anemia:secondary to acute illness/CKD. Hgb stable overnight but remains low, stable. Normocytic picture.    DM-2:CBG's controlled with SSI. Had stopped Glipizide with plans to resume on discharge. A1c 8.4.   HTN: Overall remains stable with hydralazine and amlodpine   Acute on CKD Stage 3: Cr continues to trend up. Concerns for possible nephrotic syndrome. UA with >300 protein with increasing ankle edema in the setting of HCV and active staph infection.  Appreciate input by Nephrology. IR consulted for renal biopsy that was done on 3/4.   Tobacco Abuse:transdermal nicotine, was counseled  Chronic hepatitis C: Pt is hep C positive. Discussed with ID. No plans of initiating treatment until after patient's acute issues are  resolved  Code Status: Full Family Communication: Pt in room  Disposition Plan: Pending   Consultants:  Nephrology  ID  CIR  Procedures:    Antibiotics:  Nafcillin 3/1>>>3/2  Ancef 2/29>>>3/1, resumed 3/2  HPI/Subjective: Still notes little urine output. Denies SOB or CP  Objective: Filed Vitals:   08/18/14 1510 08/18/14 1515 08/18/14 1524 08/18/14 1541  BP: 155/79 157/77 160/81 165/76  Pulse: 99 98 98 87  Temp:      TempSrc:      Resp: 14 18 18 20   Height:      Weight:      SpO2: 96% 98% 93% 96%    Intake/Output Summary (Last 24 hours) at 08/18/14 1555 Last data filed at 08/18/14 1229  Gross per 24 hour  Intake 848.34 ml  Output     15 ml  Net 833.34 ml   Filed Weights   08/07/14 2009  Weight: 90.22 kg (198 lb 14.4 oz)    Exam:   General:  Awake, in nad  Cardiovascular: regular, s1, s2  Respiratory: normal resp effort, no wheezing  Abdomen: soft,nondistended  Musculoskeletal: perfused, B pitting LE edema   Data Reviewed: Basic Metabolic Panel:  Recent Labs Lab 08/14/14 0651 08/15/14 1511 08/16/14 0500 08/17/14 0525 08/18/14 0512  NA 132* 127* 127* 128* 128*  K 3.8 3.4* 3.5 3.9 4.0  CL 96 96 94* 97 100  CO2 26 24 22  17* 15*  GLUCOSE 178* 217* 205* 121* 110*  BUN 31* 41* 46* 54* 62*  CREATININE 1.69* 3.11* 4.12* 5.28* 6.54*  CALCIUM 8.0* 7.9* 7.8* 7.7* 7.4*  PHOS  --   --  5.9*  --   --    Liver Function Tests:  Recent Labs Lab 08/12/14 0522 08/16/14 0500  AST 27  --   ALT 16  --   ALKPHOS 97  --   BILITOT 0.4  --   PROT 5.7*  --   ALBUMIN 1.1* 1.1*   No results for input(s): LIPASE, AMYLASE in the last 168 hours. No results for input(s): AMMONIA in the last 168 hours. CBC:  Recent Labs Lab 08/14/14 0651 08/15/14 1511 08/16/14 0550 08/17/14 0525 08/18/14 0512  WBC 19.7* 13.2* 12.4* 11.1* 12.5*  NEUTROABS  --  10.6*  --   --  9.6*  HGB 9.0* 8.1* 8.0* 8.3* 8.1*  HCT 27.1* 24.0* 24.1* 24.5* 23.1*  MCV 92.2  91.3 89.6 89.1 89.5  PLT 348 349 374 427* 508*   Cardiac Enzymes:  Recent Labs Lab 08/16/14 1330  CKTOTAL 45   BNP (last 3 results) No results for input(s): BNP in the last 8760 hours.  ProBNP (last 3 results) No results for input(s): PROBNP in the last 8760 hours.  CBG:  Recent Labs Lab 08/17/14 1154 08/17/14 1707 08/17/14 2157 08/18/14 0746 08/18/14 1153  GLUCAP 136* 129* 132* 115* 107*    Recent Results (from the past 240 hour(s))  Culture, blood (routine x 2)     Status: None   Collection Time: 08/09/14 12:15 PM  Result Value Ref Range Status   Specimen Description BLOOD RIGHT HAND  Final   Special Requests   Final    BOTTLES DRAWN AEROBIC AND ANAEROBIC 7CCS BOTH BOTTLES   Culture   Final    STAPHYLOCOCCUS AUREUS Note: RIFAMPIN AND GENTAMICIN SHOULD NOT BE USED AS SINGLE DRUGS FOR TREATMENT OF STAPH INFECTIONS. Note: Gram Stain Report Called to,Read Back By and Verified With: Shirlean Kelly 700AM 08/10/14 [place] Performed at [facility]    Report Status 08/12/2014 FINAL  Final   Organism ID, Bacteria STAPHYLOCOCCUS AUREUS  Final      Susceptibility   Staphylococcus aureus - MIC*    CLINDAMYCIN <=0.25 SENSITIVE Sensitive     ERYTHROMYCIN <=0.25 SENSITIVE Sensitive     GENTAMICIN <=0.5 SENSITIVE Sensitive     LEVOFLOXACIN 0.25 SENSITIVE Sensitive     OXACILLIN <=0.25 SENSITIVE Sensitive     PENICILLIN >=0.5 RESISTANT Resistant     RIFAMPIN <=0.5 SENSITIVE Sensitive     TRIMETH/SULFA <=10 SENSITIVE Sensitive     VANCOMYCIN 1 SENSITIVE Sensitive     TETRACYCLINE <=1 SENSITIVE Sensitive     MOXIFLOXACIN <=0.25 SENSITIVE Sensitive     * STAPHYLOCOCCUS AUREUS  Culture, blood (routine x 2)     Status: None   Collection Time: 08/09/14 12:25 PM  Result Value Ref Range Status   Specimen Description BLOOD RIGHT ARM  Final   Special Requests   Final    BOTTLES DRAWN AEROBIC AND ANAEROBIC 10CC BOTH BOTTLES   Culture   Final    STAPHYLOCOCCUS  AUREUS Note: SUSCEPTIBILITIES PERFORMED ON PREVIOUS CULTURE WITHIN THE LAST 5 DAYS. Note: Gram Stain Report Called to,Read Back By and Verified With: Shirlean Kelly 700AM 08/10/14 [place] Performed at [facility]    Report Status 08/12/2014 FINAL  Final  Culture, routine-abscess     Status: None   Collection Time: 08/10/14 11:54 AM  Result Value Ref Range Status   Specimen Description SHOULDER LEFT  Final   Special Requests NONE  Final   Gram Stain   Final    ABUNDANT WBC PRESENT, PREDOMINANTLY PMN NO SQUAMOUS EPITHELIAL CELLS SEEN MODERATE GRAM POSITIVE COCCI IN PAIRS IN CLUSTERS Performed at [facility]  Culture   Final    MODERATE STAPHYLOCOCCUS AUREUS Note: RIFAMPIN AND GENTAMICIN SHOULD NOT BE USED AS SINGLE DRUGS FOR TREATMENT OF STAPH INFECTIONS. Performed at [facility]    Report Status 08/13/2014 FINAL  Final   Organism ID, Bacteria STAPHYLOCOCCUS AUREUS  Final      Susceptibility   Staphylococcus aureus - MIC*    CLINDAMYCIN <=0.25 SENSITIVE Sensitive     ERYTHROMYCIN <=0.25 SENSITIVE Sensitive     GENTAMICIN <=0.5 SENSITIVE Sensitive     LEVOFLOXACIN 0.25 SENSITIVE Sensitive     OXACILLIN 0.5 SENSITIVE Sensitive     PENICILLIN >=0.5 RESISTANT Resistant     RIFAMPIN <=0.5 SENSITIVE Sensitive     TRIMETH/SULFA <=10 SENSITIVE Sensitive     VANCOMYCIN 1 SENSITIVE Sensitive     TETRACYCLINE <=1 SENSITIVE Sensitive     MOXIFLOXACIN <=0.25 SENSITIVE Sensitive     * MODERATE STAPHYLOCOCCUS AUREUS  Anaerobic culture     Status: None (Preliminary result)   Collection Time: 08/10/14 11:54 AM  Result Value Ref Range Status   Specimen Description SHOULDER LEFT  Final   Special Requests NONE  Final   Gram Stain   Final    MODERATE WBC PRESENT, PREDOMINANTLY PMN NO SQUAMOUS EPITHELIAL CELLS SEEN MODERATE GRAM POSITIVE COCCI IN PAIRS IN CLUSTERS Performed at [facility]    Culture   Final    No Anaerobes Isolated; Culture in  Progress for 14 DAYS Performed at [facility]    Report Status PENDING  Incomplete  Body fluid culture     Status: None   Collection Time: 08/10/14  5:22 PM  Result Value Ref Range Status   Specimen Description SYNOVIAL JOINT RIGHT KNEE  Final   Special Requests Normal  Final   Gram Stain   Final    ABUNDANT WBC PRESENT,BOTH PMN AND MONONUCLEAR NO ORGANISMS SEEN Performed at [facility]    Culture   Final    FEW STAPHYLOCOCCUS AUREUS Note: RIFAMPIN AND GENTAMICIN SHOULD NOT BE USED AS SINGLE DRUGS FOR TREATMENT OF STAPH INFECTIONS. CRITICAL RESULT CALLED TO, READ BACK BY AND VERIFIED WITH: CINDY FLORES RN 08/13/14 AT 8 AM BY [facility] Performed at [facility]    Report Status 08/14/2014 FINAL  Final   Organism ID, Bacteria STAPHYLOCOCCUS AUREUS  Final      Susceptibility   Staphylococcus aureus - MIC*    CLINDAMYCIN <=0.25 SENSITIVE Sensitive     ERYTHROMYCIN <=0.25 SENSITIVE Sensitive     GENTAMICIN <=0.5 SENSITIVE Sensitive     LEVOFLOXACIN 0.25 SENSITIVE Sensitive     OXACILLIN 0.5 SENSITIVE Sensitive     PENICILLIN >=0.5 RESISTANT Resistant     RIFAMPIN <=0.5 SENSITIVE Sensitive     TRIMETH/SULFA <=10 SENSITIVE Sensitive     VANCOMYCIN <=0.5 SENSITIVE Sensitive     TETRACYCLINE <=1 SENSITIVE Sensitive     MOXIFLOXACIN <=0.25 SENSITIVE Sensitive     * FEW STAPHYLOCOCCUS AUREUS  Surgical pcr screen     Status: Abnormal   Collection Time: 08/10/14  6:42 PM  Result Value Ref Range Status   MRSA, PCR NEGATIVE NEGATIVE Final   Staphylococcus aureus POSITIVE (A) NEGATIVE Final    Comment:        The Xpert SA Assay (FDA approved for NASAL specimens in patients over 78 years of age), is one component of a comprehensive surveillance program.  Test performance has been validated by [facility] for patients greater than or equal to 108 year old. It is not  intended to diagnose infection nor to guide or monitor treatment.   Body fluid culture      Status: None   Collection Time: 08/10/14  9:14 PM  Result Value Ref Range Status   Specimen Description FLUID LEFT AC JOINT  Final   Special Requests VANCO, ROCEPHIN  Final   Gram Stain   Final    FEW WBC PRESENT, PREDOMINANTLY PMN MODERATE GRAM POSITIVE COCCI IN PAIRS IN CLUSTERS Gram Stain Report Called to,Read Back By and Verified With: Gram Stain Report Called to,Read Back By and Verified With: Laurance Flatten RN 08/11/13 9:10AM BY [place] Performed at [facility]    Culture   Final    ABUNDANT STAPHYLOCOCCUS AUREUS Note: RIFAMPIN AND GENTAMICIN SHOULD NOT BE USED AS SINGLE DRUGS FOR TREATMENT OF STAPH INFECTIONS. Performed at [facility]    Report Status 08/13/2014 FINAL  Final   Organism ID, Bacteria STAPHYLOCOCCUS AUREUS  Final      Susceptibility   Staphylococcus aureus - MIC*    CLINDAMYCIN <=0.25 SENSITIVE Sensitive     ERYTHROMYCIN <=0.25 SENSITIVE Sensitive     GENTAMICIN <=0.5 SENSITIVE Sensitive     LEVOFLOXACIN 0.25 SENSITIVE Sensitive     OXACILLIN <=0.25 SENSITIVE Sensitive     PENICILLIN >=0.5 RESISTANT Resistant     RIFAMPIN <=0.5 SENSITIVE Sensitive     TRIMETH/SULFA <=10 SENSITIVE Sensitive     VANCOMYCIN 1 SENSITIVE Sensitive     TETRACYCLINE <=1 SENSITIVE Sensitive     MOXIFLOXACIN <=0.25 SENSITIVE Sensitive     * ABUNDANT STAPHYLOCOCCUS AUREUS  Anaerobic culture     Status: None   Collection Time: 08/10/14  9:14 PM  Result Value Ref Range Status   Specimen Description FLUID LEFT AC JOINT  Final   Special Requests VANCO, ROCEPHIN  Final   Gram Stain   Final    FEW WBC PRESENT, PREDOMINANTLY PMN MODERATE GRAM POSITIVE COCCI IN PAIRS IN CLUSTERS Performed at [facility]    Culture   Final    NO ANAEROBES ISOLATED Performed at [facility]    Report Status 08/15/2014 FINAL  Final  Culture, blood (routine x 2)     Status: None   Collection Time: 08/11/14  7:00 AM  Result Value Ref Range Status   Specimen Description  BLOOD RIGHT ARM  Final   Special Requests BOTTLES DRAWN AEROBIC AND ANAEROBIC 6CC  Final   Culture   Final    NO GROWTH 5 DAYS Performed at [facility]    Report Status 08/17/2014 FINAL  Final  Culture, blood (routine x 2)     Status: None   Collection Time: 08/11/14  7:08 AM  Result Value Ref Range Status   Specimen Description BLOOD LEFT HAND  Final   Special Requests BOTTLES DRAWN AEROBIC AND ANAEROBIC [place]  Final   Culture   Final    NO GROWTH 5 DAYS Performed at [facility]    Report Status 08/17/2014 FINAL  Final  Culture, Urine     Status: None   Collection Time: 08/12/14  5:47 AM  Result Value Ref Range Status   Specimen Description URINE, RANDOM  Final   Special Requests NONE  Final   Colony Count   Final    3,000 COLONIES/ML Performed at [facility]    Culture   Final    INSIGNIFICANT GROWTH Performed at [facility]    Report Status 08/13/2014 FINAL  Final  Culture, blood (routine x 2)  Status: None (Preliminary result)   Collection Time: 08/15/14  1:15 PM  Result Value Ref Range Status   Specimen Description BLOOD RIGHT HAND  Final   Special Requests BOTTLES DRAWN AEROBIC ONLY 3CC  Final   Culture   Final           BLOOD CULTURE RECEIVED NO GROWTH TO DATE CULTURE WILL BE HELD FOR 5 DAYS BEFORE ISSUING A FINAL NEGATIVE REPORT Performed at [facility]    Report Status PENDING  Incomplete  Culture, blood (routine x 2)     Status: None (Preliminary result)   Collection Time: 08/15/14  3:11 PM  Result Value Ref Range Status   Specimen Description BLOOD RIGHT HAND  Final   Special Requests   Final    BOTTLES DRAWN AEROBIC AND ANAEROBIC BLUE 10CC RED 5CC   Culture   Final           BLOOD CULTURE RECEIVED NO GROWTH TO DATE CULTURE WILL BE HELD FOR 5 DAYS BEFORE ISSUING A FINAL NEGATIVE REPORT Performed at [facility]    Report Status PENDING  Incomplete     Studies: No results found.  Scheduled  Meds: . amLODipine  5 mg Oral Daily  . antiseptic oral rinse  7 mL Mouth Rinse q12n4p  . aspirin  81 mg Oral Daily  . ceFAZolin      . [START ON 08/19/2014]  ceFAZolin (ANCEF) IV  2 g Intravenous Q12H  . chlorhexidine  15 mL Mouth Rinse BID  . cyclobenzaprine  5 mg Oral BID  . famotidine  20 mg Oral Daily  . fentaNYL      . ferrous gluconate  324 mg Oral Q breakfast  . heparin      . heparin subcutaneous  5,000 Units Subcutaneous 3 times per day  . hydrALAZINE  25 mg Oral TID  . insulin aspart  0-9 Units Subcutaneous TID WC  . lidocaine      . midazolam      . nicotine  21 mg Transdermal Daily  . polyethylene glycol  17 g Oral BID  . sodium chloride  3 mL Intravenous Q12H   Continuous Infusions: . sodium chloride 100 mL/hr at 08/18/14 0910    Principal Problem:   Staphylococcus aureus bacteremia Active Problems:   Septic joint of left shoulder region   Sinus tachycardia   Septic joint of right knee joint   CKD (chronic kidney disease) stage 3, GFR 30-59 ml/min   Septic joint of right hand   SIRS (systemic inflammatory response syndrome)   Benign essential HTN   Hepatitis C   Acute renal failure    [facility], [facility] Pager [phone]. If 7PM-7AM, please contact night-coverage at [email or web address], password [facility] 08/18/2014, 3:55 PM  LOS: 11 days

## 2014-08-18 NOTE — Procedures (Signed)
Procedure:  Ultrasound guided renal core biopsy Findings:  16 G core biopsy x 2 obtained of right LP coretex.  Intact cores obtained.  No immediate complications.

## 2014-08-18 NOTE — Sedation Documentation (Signed)
Dressing to Rt lower back dry and intact.  HD cath dressing changed using sterile technique d/t small amount of oozing to gauze at site.

## 2014-08-18 NOTE — Sedation Documentation (Signed)
Pt rolled from nursing station to ultrasound for renal biopsy

## 2014-08-18 NOTE — Plan of Care (Signed)
Problem: Phase III Progression Outcomes Goal: Activity at appropriate level-compared to baseline (UP IN CHAIR FOR HEMODIALYSIS)  Outcome: Adequate for Discharge Pt possible going to be discharged to CIR

## 2014-08-18 NOTE — Care Management Note (Unsigned)
    Page 1 of 1   08/18/2014     3:08:21 PM CARE MANAGEMENT NOTE 08/18/2014  Patient:  Brandy Houston, Brandy Houston   Account Number:  [id number]  Date Initiated:  08/18/2014  Documentation initiated by:  Tomi Bamberger  Subjective/Objective Assessment:   dx left ar m shoulder injury, arf, hepc ? septic thrombosis  admit- from home.     Action/Plan:   Anticipated DC Date:  08/22/2014   Anticipated DC Plan:  IP REHAB FACILITY  In-house referral  Clinical Social Worker      DC Planning Services  CM consult      PAC Choice  IP REHAB   Choice offered to / List presented to:             Status of service:  In process, will continue to follow Medicare Important Message given?  NO (If response is "NO", the following Medicare IM given date fields will be blank) Date Medicare IM given:   Medicare IM given by:   Date Additional Medicare IM given:   Additional Medicare IM given by:    Discharge Disposition:    Per UR Regulation:  Reviewed for med. necessity/level of care/duration of stay  If discussed at [place] of Stay Meetings, dates discussed:   08/15/2014    Comments:  08/18/14 [place], BSN [phone] patient is in ARF, has positive hep c, leukocytosis, ID and nephrology following. Plan is for CIR when stable.

## 2014-08-18 NOTE — Procedures (Signed)
Procedure:  Temporary nontuneled HD catheter Access:  Right IJ vein 20 cm Trialysis catheter placed with tip at cavoatrial junction.  OK to use.  No pneumothorax.

## 2014-08-18 NOTE — Progress Notes (Signed)
Knee aspirate is concerning for redevelopment of septic arthritis.  Will plan for arthroscopic I&D of left knee sat.  NPO after midnight.  Patient not acutely septic.  Brandy Cecil, MD [place] 10:07 PM

## 2014-08-18 NOTE — Progress Notes (Signed)
ANTIBIOTIC CONSULT NOTE - FOLLOW UP  Pharmacy Consult for Ancef Indication:  MSSA polyarticular septic arthritis  Allergies  Allergen Reactions  . Compazine [Prochlorperazine] Shortness Of Breath and Swelling    TONGUE SWELLS  . Omnipaque [Iohexol] Hives  . Shellfish-Derived Products Anaphylaxis  . Iodinated Diagnostic Agents Rash    Patient Measurements: Height: 5' 7.5" (171.5 cm) Weight: 198 lb 14.4 oz (90.22 kg) IBW/kg (Calculated) : 62.75 Adjusted Body Weight:   Vital Signs: Temp: 98.1 F (36.7 C) (03/04 0525) Temp Source: Oral (03/04 0525) BP: 145/69 mmHg (03/04 0911) Pulse Rate: 91 (03/04 1242) Intake/Output from previous day: 03/03 0701 - 03/04 0700 In: 1278.3 [P.O.:420; I.V.:858.3] Out: 15 [Urine:15] Intake/Output from this shift: Total I/O In: 548.3 [I.V.:548.3] Out: -   Labs:  Recent Labs  08/15/14 1755 08/16/14 0500 08/16/14 0550 08/16/14 1515 08/17/14 0525 08/18/14 0512  WBC  --   --  12.4*  --  11.1* 12.5*  HGB  --   --  8.0*  --  8.3* 8.1*  PLT  --   --  374  --  427* 508*  LABCREA 200.26  --   --  192.37  --   --   CREATININE  --  4.12*  --   --  5.28* 6.54*   Estimated Creatinine Clearance: 10.8 mL/min (by C-G formula based on Cr of 6.54). No results for input(s): VANCOTROUGH, VANCOPEAK, VANCORANDOM, GENTTROUGH, GENTPEAK, GENTRANDOM, TOBRATROUGH, TOBRAPEAK, TOBRARND, AMIKACINPEAK, AMIKACINTROU, AMIKACIN in the last 72 hours.   Assessment: Admit Complaint: intractable left shoulder pain, injured shoulder at work on 2/18 - MRI showed septic arthritis with myositis, s/p I&D 2/25  Infectious Disease: Ancef D#9 for MSSA polyarticular septic arthritis, CT concerning for septic tenosynovitis. ID changed to Nafcillin 3/1 x 36h and pt's AKI worsened. Not convinced it is due to Nafcillin but will change back to Ancef for now. Continued fevers (currently afebrile) and elevated WBC 12.5- concern for ongoing infection (maybe spine?). Scr 5.28>6.54  Ancef  2/25 >> 3/1, 3/2 >> Vanc 2/24 >> 2/27 CTX 2/24 >> 2/25 Nafcillin 3/1 >> 3/2  2/16 VT = 23.6 mcg/mL (last dose given 2 hrs late) on 750mg  q12 >> no change  2/24 BCx x2 - MSSA 2/25 MRSA PCR - positive 2/25 right knee fluid cx - MSSA 2/25 left AC join fluid cx - MSSA 2/25 left shoulder fluid cx (anaerobic) - no anaerobes 2/25 left should abscess - MSSA 2/26 BCx x2 - neg 2/27 UCx - insignificant growth 3/1 blood x 2 - still pending  Cardiovascular: HTN 145/69 - ASA81, Norvasc5, hydralazine 2/29 TEE- normal LV, no vegetations (did NOT tolerate well)  Endocrinology: DM - CBGs controlled on SSI  Gastrointestinal / Nutrition: Hep C+ (new dx this admission) LFTs WNL - Pepcid, Miralax BID  Neurology: PN - Flexeril scheduled  Nephrology: ARF SCr up 6.54 (baseline 1.69), rec'd contrast 2/22 and 2/26. Renal consult - will likely need renal bx and HD  Pulmonary: RA- nicotine patch  Hematology / Oncology: anemia with hgb 8.1, plts WNL -po FESO4  PTA Medication Issues: gabapentin, Paxil  Best Practices: sq heparin, MC, Pepcid   Goal of Therapy:  Treatment of infection  Plan:  Ancef 2gm IV q12h (need to change to q24h if CrCl <10) Follow-up renal function, renal bx, HD   [place] Rickey S. Alford Highland, PharmD, BCPS Clinical Staff Pharmacist Pager [phone]  Eilene Ghazi Stilling[place] 08/18/2014,12:43 PM

## 2014-08-18 NOTE — Sedation Documentation (Signed)
Patient is resting comfortably. 

## 2014-08-18 NOTE — Progress Notes (Signed)
PT Cancellation Note  Patient Details Name: Brandy Houston MRN: [id number] DOB: 1956-05-16   Cancelled Treatment:    Reason Eval/Treat Not Completed: Patient at procedure or test/unavailable.  Pt down in interventional radiology when arrived.  Will see as able. 08/18/2014  Donnella Sham, [place] [phone]  (pager)   Margaretha Mahan, [name] 08/18/2014, 2:03 PM

## 2014-08-18 NOTE — Sedation Documentation (Signed)
CO2 w/ poor waveform in supine position.  Attempted to reposition to face.

## 2014-08-18 NOTE — Progress Notes (Signed)
[name], female   DOB: February 07, 1956, 59 y.o.   MRN: [id number] S:feels weak O:BP 145/69 mmHg  Pulse 90  Temp(Src) 98.1 F (36.7 C) (Oral)  Resp 18  Ht 5' 7.5" (1.715 m)  Wt 90.22 kg (198 lb 14.4 oz)  BMI 30.67 kg/m2  SpO2 99%  LMP 03/16/2002  Intake/Output Summary (Last 24 hours) at 08/18/14 0957 Last data filed at 08/18/14 0843  Gross per 24 hour  Intake 1450.01 ml  Output     15 ml  Net 1435.01 ml   Intake/Output: I/O last 3 completed shifts: In: 1518.3 [P.O.:660; I.V.:858.3] Out: 265 [Urine:265]  Intake/Output this shift:  Total I/O In: 171.7 [I.V.:171.7] Out: -  Weight change:  Gen:WD WN AAF in NAD, slowed mentation CVS:no rub Resp:occ rhonchi [id number] Ext:+anasarca   Recent Labs Lab 08/12/14 0522 08/13/14 0809 08/14/14 0651 08/15/14 1511 08/16/14 0500 08/17/14 0525 08/18/14 0512  NA 136 133* 132* 127* 127* 128* 128*  K 3.6 3.4* 3.8 3.4* 3.5 3.9 4.0  CL 100 98 96 96 94* 97 100  CO2 26 24 26 24 22  17* 15*  GLUCOSE 112* 164* 178* 217* 205* 121* 110*  BUN 21 23 31* 41* 46* 54* 62*  CREATININE 1.42* 1.35* 1.69* 3.11* 4.12* 5.28* 6.54*  ALBUMIN 1.1*  --   --   --  1.1*  --   --   CALCIUM 8.0* 8.2* 8.0* 7.9* 7.8* 7.7* 7.4*  PHOS  --   --   --   --  5.9*  --   --   AST 27  --   --   --   --   --   --   ALT 16  --   --   --   --   --   --    Liver Function Tests:  Recent Labs Lab 08/12/14 0522 08/16/14 0500  AST 27  --   ALT 16  --   ALKPHOS 97  --   BILITOT 0.4  --   PROT 5.7*  --   ALBUMIN 1.1* 1.1*   No results for input(s): LIPASE, AMYLASE in the last 168 hours. No results for input(s): AMMONIA in the last 168 hours. CBC:  Recent Labs Lab 08/14/14 0651 08/15/14 1511 08/16/14 0550 08/17/14 0525 08/18/14 0512  WBC 19.7* 13.2* 12.4* 11.1* 12.5*  NEUTROABS  --  10.6*  --   --  9.6*  HGB 9.0* 8.1* 8.0* 8.3* 8.1*  HCT 27.1* 24.0* 24.1* 24.5* 23.1*  MCV 92.2 91.3 89.6 89.1 89.5  PLT 348 349 374 427* 508*   Cardiac  Enzymes:  Recent Labs Lab 08/16/14 1330  CKTOTAL 45   CBG:  Recent Labs Lab 08/17/14 0756 08/17/14 1154 08/17/14 1707 08/17/14 2157 08/18/14 0746  GLUCAP 119* 136* 129* 132* 115*    Iron Studies: No results for input(s): IRON, TIBC, TRANSFERRIN, FERRITIN in the last 72 hours. Studies/Results: No results found. [name] amLODipine  5 mg Oral Daily  . antiseptic oral rinse  7 mL Mouth Rinse q12n4p  . aspirin  81 mg Oral Daily  .  ceFAZolin (ANCEF) IV  1 g Intravenous Q12H  . chlorhexidine  15 mL Mouth Rinse BID  . cyclobenzaprine  5 mg Oral BID  . famotidine  20 mg Oral Daily  . ferrous gluconate  324 mg Oral Q breakfast  . heparin subcutaneous  5,000 Units Subcutaneous 3 times per day  . hydrALAZINE  25 mg Oral TID  . insulin aspart  0-9 Units Subcutaneous TID WC  . nicotine  21 mg Transdermal Daily  . polyethylene glycol  17 g Oral BID  . sodium chloride  3 mL Intravenous Q12H    BMET    Component Value Date/Time   NA 128* 08/18/2014 0512   K 4.0 08/18/2014 0512   CL 100 08/18/2014 0512   CO2 15* 08/18/2014 0512   GLUCOSE 110* 08/18/2014 0512   BUN 62* 08/18/2014 0512   CREATININE 6.54* 08/18/2014 0512   CREATININE 1.15* 02/27/2014 1203   CALCIUM 7.4* 08/18/2014 0512   GFRNONAA 6* 08/18/2014 0512   GFRNONAA 52* 02/27/2014 1203   GFRAA 7* 08/18/2014 0512   GFRAA 60 02/27/2014 1203   CBC    Component Value Date/Time   WBC 12.5* 08/18/2014 0512   RBC 2.58* 08/18/2014 0512   RBC 3.77* 08/07/2014 2114   HGB 8.1* 08/18/2014 0512   HCT 23.1* 08/18/2014 0512   PLT 508* 08/18/2014 0512   MCV 89.5 08/18/2014 0512   MCH 31.4 08/18/2014 0512   MCHC 35.1 08/18/2014 0512   RDW 13.9 08/18/2014 0512   LYMPHSABS 1.7 08/18/2014 0512   MONOABS 1.2* 08/18/2014 0512   EOSABS 0.1 08/18/2014 0512   BASOSABS 0.0 08/18/2014 0512    Assessment/Plan: 1. AKI/CKD- DDx includes contrast-induced nephropathy, post-infectious GN, AIN due to antibiotics, vanco toxicity (had  elevated trough level), ATN in setting of profound nephrotic syndrome and acute illness, cryoglobulinemia, or another GN related to her Hep C. Will also check tox screen to r/o cocaine use (levimasole associated with ANCA-+ vasculitis) 1. Discussed risks and benefits and she is amenable for a renal biopsy and dialysis.  2. Appreciate IR's assistance with renal biopsy and HD cath (untunneled for now in hopes that her renal function will improve as well as her polyarticular septic arthritis) 3. Still awaiting serologies 2. MSSA polyarticular septic arthritis - without underlying source of infection (negative TEE). ID following 3. HTN- stable 4. Anemia of chronic disease- will start aranesp and check iron stores 5. Hyponatremia- related to AKI and edema. Add lasix to IVF"s and follow. 6. DM- historically poorly controlled. Per primary svc  Steven Basso A

## 2014-08-19 ENCOUNTER — Inpatient Hospital Stay (HOSPITAL_COMMUNITY): Payer: Medicaid Other | Admitting: Anesthesiology

## 2014-08-19 ENCOUNTER — Encounter (HOSPITAL_COMMUNITY)
Admission: EM | Disposition: A | Discharge status: Rehabilitation Facility | Payer: Self-pay | Source: Home / Self Care | Attending: Internal Medicine

## 2014-08-19 HISTORY — PX: KNEE ARTHROSCOPY: SHX127

## 2014-08-19 LAB — GLUCOSE, CAPILLARY
Glucose-Capillary: 88 mg/dL (ref 70–99)
Glucose-Capillary: 89 mg/dL (ref 70–99)
Glucose-Capillary: 90 mg/dL (ref 70–99)
Glucose-Capillary: 106 mg/dL — ABNORMAL HIGH (ref 70–99)
Glucose-Capillary: 119 mg/dL — ABNORMAL HIGH (ref 70–99)

## 2014-08-19 LAB — RENAL FUNCTION PANEL
Albumin: 1.2 g/dL — ABNORMAL LOW (ref 3.5–5.2)
Anion gap: 15 (ref 5–15)
BUN: 49 mg/dL — ABNORMAL HIGH (ref 6–23)
CO2: 16 mmol/L — ABNORMAL LOW (ref 19–32)
Calcium: 7.2 mg/dL — ABNORMAL LOW (ref 8.4–10.5)
Chloride: 100 mmol/L (ref 96–112)
Creatinine, Ser: 5.94 mg/dL — ABNORMAL HIGH (ref 0.50–1.10)
GFR calc Af Amer: 8 mL/min — ABNORMAL LOW (ref >90)
GFR calc non Af Amer: 7 mL/min — ABNORMAL LOW (ref >90)
Glucose, Bld: 89 mg/dL (ref 70–99)
Phosphorus: 6.8 mg/dL — ABNORMAL HIGH (ref 2.3–4.6)
Potassium: 3.8 mmol/L (ref 3.5–5.1)
Sodium: 131 mmol/L — ABNORMAL LOW (ref 135–145)

## 2014-08-19 LAB — CBC
HCT: 21.6 % — ABNORMAL LOW (ref 36.0–46.0)
Hemoglobin: 7.4 g/dL — ABNORMAL LOW (ref 12.0–15.0)
MCH: 30.6 pg (ref 26.0–34.0)
MCHC: 34.3 g/dL (ref 30.0–36.0)
MCV: 89.3 fL (ref 78.0–100.0)
Platelets: 533 10*3/uL — ABNORMAL HIGH (ref 150–400)
RBC: 2.42 MIL/uL — ABNORMAL LOW (ref 3.87–5.11)
RDW: 14 % (ref 11.5–15.5)
WBC: 12.3 10*3/uL — ABNORMAL HIGH (ref 4.0–10.5)

## 2014-08-19 LAB — ANTISTREPTOLYSIN O TITER: ASO: 76.4 IU/mL (ref 0.0–200.0)

## 2014-08-19 LAB — HEPATITIS B SURFACE ANTIGEN: Hepatitis B Surface Ag: NEGATIVE

## 2014-08-19 LAB — HEMOGLOBIN AND HEMATOCRIT, BLOOD
HCT: 18.3 % — ABNORMAL LOW (ref 36.0–46.0)
Hemoglobin: 6.2 g/dL — CL (ref 12.0–15.0)

## 2014-08-19 LAB — C3 COMPLEMENT: C3 Complement: <16 mg/dL — ABNORMAL LOW (ref 82–167)

## 2014-08-19 LAB — C4 COMPLEMENT: Complement C4, Body Fluid: 42 mg/dL (ref 14–44)

## 2014-08-19 SURGERY — ARTHROSCOPY, KNEE
Anesthesia: General | Laterality: Left

## 2014-08-19 MED ORDER — ONDANSETRON HCL 4 MG/2ML IJ SOLN
INTRAMUSCULAR | Status: AC
  Filled 2014-08-19: qty 2

## 2014-08-19 MED ORDER — FENTANYL CITRATE 0.05 MG/ML IJ SOLN
INTRAMUSCULAR | Status: AC
  Filled 2014-08-19: qty 5

## 2014-08-19 MED ORDER — LIDOCAINE HCL (CARDIAC) 20 MG/ML IV SOLN
INTRAVENOUS | Status: DC | PRN
  Administered 2014-08-19: 40 mg via INTRAVENOUS

## 2014-08-19 MED ORDER — AMLODIPINE BESYLATE 10 MG PO TABS
10.0000 mg | ORAL_TABLET | Freq: Every day | ORAL | Status: DC
  Administered 2014-08-19 – 2014-08-26 (×7): 10 mg via ORAL
  Filled 2014-08-19 (×9): qty 1

## 2014-08-19 MED ORDER — SODIUM CHLORIDE 0.9 % IV SOLN
Freq: Once | INTRAVENOUS | Status: AC
  Administered 2014-08-20: 03:00:00 via INTRAVENOUS

## 2014-08-19 MED ORDER — LIDOCAINE HCL (CARDIAC) 20 MG/ML IV SOLN
INTRAVENOUS | Status: AC
  Filled 2014-08-19: qty 5

## 2014-08-19 MED ORDER — SODIUM CHLORIDE 0.9 % IR SOLN
Status: DC | PRN
  Administered 2014-08-19 (×4): 3000 mL

## 2014-08-19 MED ORDER — HYDROMORPHONE HCL 1 MG/ML IJ SOLN
0.2500 mg | INTRAMUSCULAR | Status: DC | PRN
  Administered 2014-08-19 (×2): 0.5 mg via INTRAVENOUS

## 2014-08-19 MED ORDER — PROPOFOL 10 MG/ML IV BOLUS
INTRAVENOUS | Status: DC | PRN
  Administered 2014-08-19: 30 mg via INTRAVENOUS
  Administered 2014-08-19: 170 mg via INTRAVENOUS

## 2014-08-19 MED ORDER — HYDROMORPHONE HCL 1 MG/ML IJ SOLN
INTRAMUSCULAR | Status: AC
  Filled 2014-08-19: qty 1

## 2014-08-19 MED ORDER — PROPOFOL 10 MG/ML IV BOLUS
INTRAVENOUS | Status: AC
  Filled 2014-08-19: qty 20

## 2014-08-19 MED ORDER — HYDROMORPHONE HCL 1 MG/ML IJ SOLN
1.0000 mg | INTRAMUSCULAR | Status: AC
Start: 2014-08-19 — End: 2014-08-19
  Administered 2014-08-19: 1 mg via INTRAVENOUS

## 2014-08-19 MED ORDER — HYDRALAZINE HCL 20 MG/ML IJ SOLN
10.0000 mg | INTRAMUSCULAR | Status: DC | PRN
  Filled 2014-08-19: qty 1

## 2014-08-19 MED ORDER — HYDROMORPHONE HCL 1 MG/ML IJ SOLN
1.0000 mg | INTRAMUSCULAR | Status: DC | PRN
  Administered 2014-08-19 – 2014-09-14 (×86): 1 mg via INTRAVENOUS
  Filled 2014-08-19 (×83): qty 1

## 2014-08-19 MED ORDER — ONDANSETRON HCL 4 MG/2ML IJ SOLN
INTRAMUSCULAR | Status: DC | PRN
  Administered 2014-08-19: 4 mg via INTRAVENOUS

## 2014-08-19 MED ORDER — FENTANYL CITRATE 0.05 MG/ML IJ SOLN
INTRAMUSCULAR | Status: DC | PRN
  Administered 2014-08-19 (×5): 50 ug via INTRAVENOUS

## 2014-08-19 SURGICAL SUPPLY — 41 items
BANDAGE ELASTIC 6 VELCRO ST LF (GAUZE/BANDAGES/DRESSINGS) ×2 IMPLANT
BANDAGE ESMARK 6X9 LF (GAUZE/BANDAGES/DRESSINGS) ×1 IMPLANT
BLADE CUDA 5.5 (BLADE) IMPLANT
BLADE CUTTER GATOR 3.5 (BLADE) ×2 IMPLANT
BLADE GREAT WHITE 4.2 (BLADE) IMPLANT
BLADE SURG 11 STRL SS (BLADE) IMPLANT
BLADE SURG ROTATE 9660 (MISCELLANEOUS) IMPLANT
BNDG ESMARK 6X9 LF (GAUZE/BANDAGES/DRESSINGS) ×2
BUR OVAL 6.0 (BURR) IMPLANT
COVER SURGICAL LIGHT HANDLE (MISCELLANEOUS) ×2 IMPLANT
CUFF TOURNIQUET SINGLE 34IN LL (TOURNIQUET CUFF) IMPLANT
CUFF TOURNIQUET SINGLE 44IN (TOURNIQUET CUFF) IMPLANT
DRAPE ARTHROSCOPY W/POUCH 114 (DRAPES) ×2 IMPLANT
DRAPE SURG 17X23 STRL (DRAPES) ×4 IMPLANT
DRAPE U-SHAPE 47X51 STRL (DRAPES) ×2 IMPLANT
DRSG PAD ABDOMINAL 8X10 ST (GAUZE/BANDAGES/DRESSINGS) ×2 IMPLANT
DURAPREP 26ML APPLICATOR (WOUND CARE) ×2 IMPLANT
ELECT CAUTERY BLADE 6.4 (BLADE) IMPLANT
FACESHIELD WRAPAROUND (MASK) ×2 IMPLANT
GAUZE SPONGE 4X4 12PLY STRL (GAUZE/BANDAGES/DRESSINGS) ×2 IMPLANT
GAUZE XEROFORM 1X8 LF (GAUZE/BANDAGES/DRESSINGS) ×2 IMPLANT
GLOVE NEODERM STRL 7.5 LF PF (GLOVE) ×2 IMPLANT
GLOVE SURG NEODERM 7.5  LF PF (GLOVE) ×2
GOWN STRL REIN XL XLG (GOWN DISPOSABLE) ×2 IMPLANT
KIT ROOM TURNOVER OR (KITS) ×2 IMPLANT
MANIFOLD NEPTUNE II (INSTRUMENTS) IMPLANT
NS IRRIG 1000ML POUR BTL (IV SOLUTION) IMPLANT
PACK ARTHROSCOPY DSU (CUSTOM PROCEDURE TRAY) ×2 IMPLANT
PAD ARMBOARD 7.5X6 YLW CONV (MISCELLANEOUS) ×4 IMPLANT
PAD CAST 4YDX4 CTTN HI CHSV (CAST SUPPLIES) ×1 IMPLANT
PADDING CAST COTTON 4X4 STRL (CAST SUPPLIES) ×1
PADDING CAST COTTON 6X4 STRL (CAST SUPPLIES) ×2 IMPLANT
SET ARTHROSCOPY TUBING (MISCELLANEOUS) ×1
SET ARTHROSCOPY TUBING LN (MISCELLANEOUS) ×1 IMPLANT
SPONGE GAUZE 4X4 12PLY STER LF (GAUZE/BANDAGES/DRESSINGS) ×2 IMPLANT
SPONGE LAP 4X18 X RAY DECT (DISPOSABLE) ×2 IMPLANT
SUT ETHILON 3 0 PS 1 (SUTURE) ×2 IMPLANT
TOWEL OR 17X24 6PK STRL BLUE (TOWEL DISPOSABLE) ×2 IMPLANT
TOWEL OR 17X26 10 PK STRL BLUE (TOWEL DISPOSABLE) ×2 IMPLANT
WAND HAND CNTRL MULTIVAC 90 (MISCELLANEOUS) IMPLANT
WATER STERILE IRR 1000ML POUR (IV SOLUTION) ×2 IMPLANT

## 2014-08-19 NOTE — Anesthesia Postprocedure Evaluation (Signed)
  Anesthesia Post-op Note  Patient: Brandy Houston  Procedure(s) Performed: Procedure(s): ARTHROSCOPIC WASHOUT LEFT KNEE (Left)  Patient Location: PACU  Anesthesia Type:General  Level of Consciousness: awake and alert   Airway and Oxygen Therapy: Patient Spontanous Breathing  Post-op Pain: moderate  Post-op Assessment: Post-op Vital signs reviewed, Patient's Cardiovascular Status Stable and Respiratory Function Stable  Post-op Vital Signs: Reviewed  Filed Vitals:   08/19/14 1444  BP: 135/67  Pulse: 93  Temp:   Resp: 15    Complications: No apparent anesthesia complications

## 2014-08-19 NOTE — H&P (Signed)

## 2014-08-19 NOTE — Anesthesia Procedure Notes (Signed)
Procedure Name: LMA Insertion Date/Time: 08/19/2014 1:21 PM Performed by: Marinda Elk A Pre-anesthesia Checklist: Patient identified, Timeout performed, Emergency Drugs available, Suction available and Patient being monitored Patient Re-evaluated:Patient Re-evaluated prior to inductionOxygen Delivery Method: Circle system utilized Preoxygenation: Pre-oxygenation with 100% oxygen Intubation Type: IV induction Ventilation: Mask ventilation without difficulty LMA Size: 4.0 Number of attempts: 1 Placement Confirmation: positive ETCO2 and breath sounds checked- equal and bilateral Tube secured with: Tape Dental Injury: Teeth and Oropharynx as per pre-operative assessment

## 2014-08-19 NOTE — Progress Notes (Signed)
[name], female   DOB: 1955/07/22, 59 y.o.   MRN: [id number] S:feels okay, tolerated HD well yesterday and no pain from biopsy site but is having gross hematuria O:BP 159/78 mmHg  Pulse 97  Temp(Src) 98.4 F (36.9 C) (Oral)  Resp 18  Ht 5' 7.5" (1.715 m)  Wt 90.22 kg (198 lb 14.4 oz)  BMI 30.67 kg/m2  SpO2 99%  LMP 03/16/2002  Intake/Output Summary (Last 24 hours) at 08/19/14 1151 Last data filed at 08/19/14 0620  Gross per 24 hour  Intake 978.34 ml  Output    350 ml  Net 628.34 ml   Intake/Output: I/O last 3 completed shifts: In: 1330 [P.O.:240; I.V.:1090] Out: 350 [Urine:350]  Intake/Output this shift:    Weight change:  Gen:WD WN AAF in NAD CVS:no rub Resp:cta [id number] Ext:+edema   Recent Labs Lab 08/14/14 0651 08/15/14 1511 08/16/14 0500 08/17/14 0525 08/18/14 0512 08/18/14 2305 08/19/14 1101  NA 132* 127* 127* 128* 128* 131* 131*  K 3.8 3.4* 3.5 3.9 4.0 3.8 3.8  CL 96 96 94* 97 100 100 100  CO2 26 24 22  17* 15* 18* 16*  GLUCOSE 178* 217* 205* 121* 110* 97 89  BUN 31* 41* 46* 54* 62* 44* 49*  CREATININE 1.69* 3.11* 4.12* 5.28* 6.54* 5.25* 5.94*  ALBUMIN  --   --  1.1*  --   --   --  1.2*  CALCIUM 8.0* 7.9* 7.8* 7.7* 7.4* 7.6* 7.2*  PHOS  --   --  5.9*  --   --   --  6.8*   Liver Function Tests:  Recent Labs Lab 08/16/14 0500  ALBUMIN 1.1*   No results for input(s): LIPASE, AMYLASE in the last 168 hours. No results for input(s): AMMONIA in the last 168 hours. CBC:  Recent Labs Lab 08/15/14 1511 08/16/14 0550 08/17/14 0525 08/18/14 0512 08/19/14 0431  WBC 13.2* 12.4* 11.1* 12.5* 12.3*  NEUTROABS 10.6*  --   --  9.6*  --   HGB 8.1* 8.0* 8.3* 8.1* 7.4*  HCT 24.0* 24.1* 24.5* 23.1* 21.6*  MCV 91.3 89.6 89.1 89.5 89.3  PLT 349 374 427* 508* 533*   Cardiac Enzymes:  Recent Labs Lab 08/16/14 1330  CKTOTAL 45   CBG:  Recent Labs Lab 08/18/14 0746 08/18/14 1153 08/18/14 1658 08/18/14 2210 08/19/14 0812  GLUCAP  115* 107* 116* 101* 88    Iron Studies: No results for input(s): IRON, TIBC, TRANSFERRIN, FERRITIN in the last 72 hours. Studies/Results: US Biopsy  08/18/2014   CLINICAL DATA:  Acute renal failure and need for renal biopsy.  EXAM: ULTRASOUND GUIDED CORE BIOPSY OF RIGHT KIDNEY  MEDICATIONS: 1.0 mg IV Versed; 50 mcg IV Fentanyl  Total Moderate Sedation Time: 13 minutes  PROCEDURE: The procedure, risks, benefits, and alternatives were explained to the patient. Questions regarding the procedure were encouraged and answered. The patient understands and consents to the procedure.  The right posterior flank region was prepped with Betadine in a sterile fashion, and a sterile drape was applied covering the operative field. A sterile gown and sterile gloves were used for the procedure. Local anesthesia was provided with 1% Lidocaine. A time-out was performed prior to the procedure.  Ultrasound was performed of both kidneys from a posterior approach. The right kidney was chosen for sampling. Under ultrasound guidance, 2 separate 16 gauge core biopsy passes were obtained of lower pole cortex. Core biopsy samples were submitted in saline for nephropathologic analysis. Post biopsy imaging was performed with  ultrasound.  COMPLICATIONS: None.  FINDINGS: The right renal anatomy was more favorable for biopsy compared to the left. Intact core biopsy samples were obtained. There were no immediate bleeding complications.  IMPRESSION: Ultrasound-guided core biopsy performed of lower pole cortex of the right kidney.   Electronically Signed   By: Aletta Edouard M.D.   On: 08/18/2014 17:11   Ir Fluoro Guide Cv Line Right  08/18/2014   CLINICAL DATA:  Acute renal failure and need for non tunneled hemodialysis catheter.  EXAM: NON-TUNNELED CENTRAL VENOUS CATHETER PLACEMENT WITH ULTRASOUND AND FLUOROSCOPIC GUIDANCE  FLUOROSCOPY TIME:  6 seconds.  PROCEDURE: The procedure, risks, benefits, and alternatives were explained to the  patient. Questions regarding the procedure were encouraged and answered. The patient understands and consents to the procedure.  The right neck and chest were prepped with chlorhexidine in a sterile fashion, and a sterile drape was applied covering the operative field. Maximum barrier sterile technique with sterile gowns and gloves were used for the procedure. Local anesthesia was provided with 1% lidocaine. A time-out was performed prior to the procedure.  After creating a small venotomy incision, a 19 gauge needle was advanced into the right internal jugular vein under direct, real-time ultrasound guidance. Ultrasound image documentation was performed. After securing guidewire access, the venotomy was dilated. A 13 French, 20 cm length Trialysis catheter was then advanced over the wire. Catheter position was confirmed by a fluoroscopic spot image.  The catheter was aspirated, flushed with saline, and injected with appropriate volume heparin dwells. The catheter exit site was secured with 0-Prolene retention sutures.  COMPLICATIONS: None.  No pneumothorax.  FINDINGS: After catheter placement, the tip lies at the cavoatrial junction. The catheter aspirates normally and is ready for immediate use.  IMPRESSION: Placement of non-tunneled central venous dialysis catheter via the right internal jugular vein. The catheter tip lies at the cavoatrial junction. The catheter is ready for immediate use.   Electronically Signed   By: Aletta Edouard M.D.   On: 08/18/2014 17:02   Ir US Guide Vasc Access Right  08/18/2014   CLINICAL DATA:  Acute renal failure and need for non tunneled hemodialysis catheter.  EXAM: NON-TUNNELED CENTRAL VENOUS CATHETER PLACEMENT WITH ULTRASOUND AND FLUOROSCOPIC GUIDANCE  FLUOROSCOPY TIME:  6 seconds.  PROCEDURE: The procedure, risks, benefits, and alternatives were explained to the patient. Questions regarding the procedure were encouraged and answered. The patient understands and consents to the  procedure.  The right neck and chest were prepped with chlorhexidine in a sterile fashion, and a sterile drape was applied covering the operative field. Maximum barrier sterile technique with sterile gowns and gloves were used for the procedure. Local anesthesia was provided with 1% lidocaine. A time-out was performed prior to the procedure.  After creating a small venotomy incision, a 19 gauge needle was advanced into the right internal jugular vein under direct, real-time ultrasound guidance. Ultrasound image documentation was performed. After securing guidewire access, the venotomy was dilated. A 13 French, 20 cm length Trialysis catheter was then advanced over the wire. Catheter position was confirmed by a fluoroscopic spot image.  The catheter was aspirated, flushed with saline, and injected with appropriate volume heparin dwells. The catheter exit site was secured with 0-Prolene retention sutures.  COMPLICATIONS: None.  No pneumothorax.  FINDINGS: After catheter placement, the tip lies at the cavoatrial junction. The catheter aspirates normally and is ready for immediate use.  IMPRESSION: Placement of non-tunneled central venous dialysis catheter via the right internal jugular vein.  The catheter tip lies at the cavoatrial junction. The catheter is ready for immediate use.   Electronically Signed   By: Aletta Edouard M.D.   On: 08/18/2014 17:02   US Abdomen Complete W/elastography  08/18/2014   CLINICAL DATA:  Chronic hepatitis C  EXAM: ULTRASOUND ABDOMEN  ULTRASOUND HEPATIC ELASTOGRAPHY  TECHNIQUE: Sonography of the upper abdomen was performed. In addition, ultrasound elastography evaluation of the liver was performed. A region of interest was placed within the right lobe of the liver. Following application of a compressive sonographic pulse, shear waves were detected in the adjacent hepatic tissue and the shear wave velocity was calculated. Multiple assessments were performed at the selected site. Median  shear wave velocity is correlated to a Metavir fibrosis score.  COMPARISON:  None.  FINDINGS: ULTRASOUND ABDOMEN  Gallbladder: Surgically absent.  Common bile duct: Diameter: 6 mm  Liver: No focal lesion identified. Within normal limits in parenchymal echogenicity.  IVC: No abnormality visualized.  Pancreas: Incompletely visualized but grossly unremarkable.  Spleen: Size and appearance within normal limits.  Right Kidney: Length: 13.5 cm. Echogenic renal parenchyma, suggesting medical renal disease. No mass or hydronephrosis.  Left Kidney: Length: 12.8 cm. Echogenic renal parenchyma, suggesting medical renal disease. No mass or hydronephrosis.  Abdominal aorta: No aneurysm visualized.  Other findings: None.  ULTRASOUND HEPATIC ELASTOGRAPHY  Device: Siemens Helix VTQ  Transducer 4V1  Patient position: Supine  Number of measurements:  10  Hepatic Segment:  8  Median velocity:   2.95  m/sec  IQR: 0.51  IQR/Median velocity ratio 0.172  Corresponding Metavir fibrosis score:  F3/F4  Risk of fibrosis: High  Limitations of exam: None  Pertinent findings noted on other imaging exams:  None  Please note that abnormal shear wave velocities may also be identified in clinical settings other than with hepatic fibrosis, such as: acute hepatitis, elevated right heart and central venous pressures including use of beta blockers, veno-occlusive disease (Budd-Chiari), infiltrative processes such as mastocytosis/amyloidosis/infiltrative tumor, extrahepatic cholestasis, in the post-prandial state, and liver transplantation. Correlation with patient history, laboratory data, and clinical condition recommended.  IMPRESSION: Echogenic renal parenchyma, suggesting medical renal disease.  Status post cholecystectomy.  Median hepatic shear wave velocity is calculated at 2.95 m/sec.  Corresponding Metavir fibrosis score is F3/F4.  Risk of fibrosis is high.  Follow-up:  Follow-up advised.   Electronically Signed   By: Julian Hy M.D.    On: 08/18/2014 16:49   . amLODipine  10 mg Oral Daily  . antiseptic oral rinse  7 mL Mouth Rinse q12n4p  . aspirin  81 mg Oral Daily  .  ceFAZolin (ANCEF) IV  2 g Intravenous Q12H  . chlorhexidine  15 mL Mouth Rinse BID  . cyclobenzaprine  5 mg Oral BID  . famotidine  20 mg Oral Daily  . ferrous gluconate  324 mg Oral Q breakfast  . heparin subcutaneous  5,000 Units Subcutaneous 3 times per day  . hydrALAZINE  25 mg Oral TID  . insulin aspart  0-9 Units Subcutaneous TID WC  . nicotine  21 mg Transdermal Daily  . polyethylene glycol  17 g Oral BID  . sodium chloride  3 mL Intravenous Q12H    BMET    Component Value Date/Time   NA 131* 08/18/2014 2305   K 3.8 08/18/2014 2305   CL 100 08/18/2014 2305   CO2 18* 08/18/2014 2305   GLUCOSE 97 08/18/2014 2305   BUN 44* 08/18/2014 2305   CREATININE 5.25* 08/18/2014 2305  CREATININE 1.15* 02/27/2014 1203   CALCIUM 7.6* 08/18/2014 2305   GFRNONAA 8* 08/18/2014 2305   GFRNONAA 52* 02/27/2014 1203   GFRAA 9* 08/18/2014 2305   GFRAA 60 02/27/2014 1203   CBC    Component Value Date/Time   WBC 12.3* 08/19/2014 0431   RBC 2.42* 08/19/2014 0431   RBC 3.77* 08/07/2014 2114   HGB 7.4* 08/19/2014 0431   HCT 21.6* 08/19/2014 0431   PLT 533* 08/19/2014 0431   MCV 89.3 08/19/2014 0431   MCH 30.6 08/19/2014 0431   MCHC 34.3 08/19/2014 0431   RDW 14.0 08/19/2014 0431   LYMPHSABS 1.7 08/18/2014 0512   MONOABS 1.2* 08/18/2014 0512   EOSABS 0.1 08/18/2014 0512   BASOSABS 0.0 08/18/2014 0512     Assessment/Plan: 1. 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 Will also check tox screen to r/o cocaine use (levimasole associated with ANCA-+ vasculitis) 1. S/p renal biopsy 08/18/14 2. First HD session 08/18/14, awaiting labs before  deciding upon another session of HD.   3. Starting to see some increase in UOP. 4. 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 Appreciate IR's assistance with renal biopsy and HD cath (untunneled for now in hopes that her renal function will improve as well as her polyarticular septic arthritis) 6. Hold off on HD today since she is going to surgery.  Will reassess tomorrow. 2. MSSA polyarticular septic arthritis - without underlying source of infection (negative TEE). ID following 1. For surgery today to drain left knee. 3. Gross hematuria- following renal biopsy.  Continue to follow h/h and hopefully urine will start to clear if not she may need a 3 way foley to flush clots. 4. HTN- stable 5. Anemia of chronic disease- will start aranesp and check iron stores 6. Hyponatremia- related to AKI and edema. Add lasix to IVF"s and follow. 7. DM- historically poorly controlled. Per primary svc  Teng Decou A

## 2014-08-19 NOTE — Op Note (Signed)
Date of surgery: 08/19/2014  Preoperative diagnosis: Septic arthritis left knee  Postoperative diagnosis same  Procedure: 1. Arthroscopic irrigation and debridement of septic arthritis of left knee 2. Arthroscopic extensive synovectomy in all 3 compartments of the left knee  Surgeon: Eduard Roux, M.D.  Anesthesia: Gen. next  Estimated blood loss: Minimal  Tourniquet time: Less than 1 hour  Complications: None  Indications for procedure: Brandy Houston is a 59 year old female who has multi joint septic arthritis along with bacteremia who returns today for a repeat washout of her left knee septic arthritis. She had undergone washout of both her shoulders in both her knees and her right long finger a week ago and she redeveloped septic arthritis of the left knee based on a knee aspiration. She was aware of the risks, benefits, and alternatives to surgery and she wished to proceed. The consent was signed. Next  Description of procedure: The patient was identified in the preoperative holding area. The operative site was marked by the surgeon confirmed with the patient. She was brought back to the operating room. She was placed supine on table. General anesthesia was induced. Nonsterile tourniquet was placed on left upper thigh. Left lower extremity was prepped and draped in standard sterile fashion. Preoperative antibiotics were given. A timeout was performed. The tourniquet was inflated to 3-50 mmHg. The standard anteromedial anterolateral portals were reused from the previous surgery. We also created an additional superior lateral portal using arthroscopic visualization. Extensive synovectomy in all 3 compartments of the left knee was carried out using oscillating shaver. Any infected or suspicious looking tissue was sharply debrided. 12 L of fluid was irrigated through the knee. A medium Hemovac drain was placed intra-articularly. The portal sites were closed with interrupted nylon sutures. Sterile  dressings were applied. Tourniquet was deflated. Patient tolerated the procedure well was expanded and transferred to the PACU in stable condition. Next  Postoperative plan: The patient will be weight bear as tolerated to left lower extremity. We will remove the Hemovac drain when it's appropriate.  Azucena Cecil, MD [place] 2:21 PM

## 2014-08-19 NOTE — Anesthesia Preprocedure Evaluation (Addendum)
Anesthesia Evaluation  Patient identified by MRN, date of birth, ID band Patient awake    Reviewed: Allergy & Precautions, H&P , NPO status , Patient's Chart, lab work & pertinent test results  Airway Mallampati: II  TM Distance: >3 FB Neck ROM: Full    Dental no notable dental hx. (+) Poor Dentition, Loose, Chipped, Dental Advisory Given   Pulmonary neg pulmonary ROS, Current Smoker,    Pulmonary exam normal       Cardiovascular hypertension, Pt. on medications     Neuro/Psych negative neurological ROS  negative psych ROS   GI/Hepatic negative GI ROS, (+) Hepatitis -, C  Endo/Other  diabetes, Type 2, Oral Hypoglycemic Agents  Renal/GU Renal InsufficiencyRenal disease  negative genitourinary   Musculoskeletal  (+) Arthritis -, Septic arthritis secondary to MSSA   Abdominal   Peds  Hematology negative hematology ROS (+)   Anesthesia Other Findings   Reproductive/Obstetrics negative OB ROS                            Anesthesia Physical Anesthesia Plan  ASA: III  Anesthesia Plan: General   Post-op Pain Management:    Induction: Intravenous  Airway Management Planned: Oral ETT and LMA  Additional Equipment:   Intra-op Plan:   Post-operative Plan: Extubation in OR  Informed Consent: I have reviewed the patients History and Physical, chart, labs and discussed the procedure including the risks, benefits and alternatives for the proposed anesthesia with the patient or authorized representative who has indicated his/her understanding and acceptance.   Dental advisory given  Plan Discussed with: CRNA  Anesthesia Plan Comments:         Anesthesia Quick Evaluation

## 2014-08-19 NOTE — Significant Event (Signed)
Rapid Response Event Note  Overview: Time Called: [id number] Arrival Time: 1604 Event Type: Other (Comment)  Initial Focused Assessment:  Called by Rn to assess patient for bleeding of incision.  Upon my arrival to patients room RN at bedside.  Patient alert and in pain.  Patient had I &D of left leg today and recently returned to floor.  Left leg wrapped in ace with hemovac in place.  Left foot and lower calf swollen.  RN states MD had been paged and updated.     Interventions:  Instructed RN to reinforce dressing, assisted RN with re-ineforcing dressing and changed linens.  Small amount o blood in hemovac currently.    MD at bedside.     Event Summary:  RN to call if assistance needed   at      at          [facility], Harlin Rain

## 2014-08-19 NOTE — Progress Notes (Signed)
Dr. [name] notified that left dressing site from I & D continue to bleed through reinforcement.  Dr. [name] verbalized that he will come  See pt.

## 2014-08-19 NOTE — Transfer of Care (Signed)
Immediate Anesthesia Transfer of Care Note  Patient: Brandy Houston  Procedure(s) Performed: Procedure(s): ARTHROSCOPIC WASHOUT LEFT KNEE (Left)  Patient Location: PACU  Anesthesia Type:General  Level of Consciousness: awake  Airway & Oxygen Therapy: Patient Spontanous Breathing and Patient connected to nasal cannula oxygen  Post-op Assessment: Report given to RN and Post -op Vital signs reviewed and stable  Post vital signs: Reviewed and stable  Last Vitals:  Filed Vitals:   08/19/14 1034  BP: 159/78  Pulse: 97  Temp:   Resp:     Complications: No apparent anesthesia complications

## 2014-08-19 NOTE — Progress Notes (Signed)
[facility] PROGRESS NOTE  Brandy Houston [id number] DOB: [birth year] DOA: 08/07/2014 PCP: Chari Manning, NP  Assessment/Plan: MSSA bacteremia with sepsis and Polyarticular septic arthrits:Initally admitted with acute left shoulder pain, further work up with a MRI of left shoulder demonstrated a septic AC joint.Subsequent blood cultures positive for MSSA. ID and Orthopedics were consulted. Blood cultures on 2/24 was also positive for MSSA. Patient meanwhile started having pain/swelling of her B/L knee joints, Right should joint as well. Further workup demonstarted septic arthritis of these joints.Patient subsequently underwent irrigation and debridement of Right knee, left knee, Right AC & left AC joints, and Irrigation and debridement of right hand flexor tendon sheath. Underwent TEE on 2/29 that was negative. Infectious disease recently recommended switching from Ancef to nafcillin. With worsening renal function, ID has changed abx back to ancef. - Discussed case with ID, recommended joint aspiration of R knee given increased swelling and intermittent fevers - R knee aspirated on 3/4 with 33k WBC's with no growth thus far - R knee underwent arthroscopic irrigation and debridement on 3/5    Anemia:secondary to acute illness/CKD.  -Bleeding noted at post-op site from R knee. -Will check repeat h/h and transfuse as needed   DM-2:CBG's controlled with SSI. Had stopped Glipizide with plans to resume on discharge. A1c 8.4.   HTN: Overall remains stable with hydralazine and amlodpine   Acute on CKD Stage 3: Cr continues to trend up. Nephrology following and input is greatly appreciated. 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 Initial plans for HD today, however decision to reassess tomorrow given surgery today   Tobacco Abuse:transdermal nicotine, was counseled  Chronic hepatitis C: Pt is hep C positive. Discussed with ID.  -  No plans of initiating treatment until after patient's acute issues are resolved  Code Status: Full Family Communication: Pt in room  Disposition Plan: Pending   Consultants:  Nephrology  ID  CIR  Orthopedic Surgery  Procedures:    Antibiotics:  Nafcillin 3/1>>>3/2  Ancef 2/29>>>3/1, resumed 3/2  HPI/Subjective: Reports continued diffuse joint pains. Denies sob.  Objective: Filed Vitals:   08/19/14 1547 08/19/14 1602 08/19/14 1615 08/19/14 1630  BP: 153/68 152/67 132/59 145/60  Pulse: 91 93    Temp:      TempSrc:      Resp:      Height:      Weight:      SpO2: 99% 99% 99%     Intake/Output Summary (Last 24 hours) at 08/19/14 1745 Last data filed at 08/19/14 1415  Gross per 24 hour  Intake 801.67 ml  Output    410 ml  Net 391.67 ml   Filed Weights   08/07/14 2009  Weight: 90.22 kg (198 lb 14.4 oz)    Exam:   General:  Awake, in nad  Cardiovascular: regular, s1, s2  Respiratory: normal resp effort, no wheezing  Abdomen: soft,nondistended  Musculoskeletal: perfused, B pitting LE edema   Data Reviewed: Basic Metabolic Panel:  Recent Labs Lab 08/16/14 0500 08/17/14 0525 08/18/14 0512 08/18/14 2305 08/19/14 1101  NA 127* 128* 128* 131* 131*  K 3.5 3.9 4.0 3.8 3.8  CL 94* 97 100 100 100  CO2 22 17* 15* 18* 16*  GLUCOSE 205* 121* 110* 97 89  BUN 46* 54* 62* 44* 49*  CREATININE 4.12* 5.28* 6.54* 5.25* 5.94*  CALCIUM 7.8* 7.7* 7.4* 7.6* 7.2*  PHOS 5.9*  --   --   --  6.8*   Liver Function Tests:  Recent Labs Lab 08/16/14 0500 08/19/14 1101  ALBUMIN 1.1* 1.2*   No results for input(s): LIPASE, AMYLASE in the last 168 hours. No results for input(s): AMMONIA in the last 168 hours. CBC:  Recent Labs Lab 08/15/14 1511 08/16/14 0550 08/17/14 0525 08/18/14 0512 08/19/14 0431  WBC 13.2* 12.4* 11.1* 12.5* 12.3*  NEUTROABS 10.6*  --   --  9.6*  --   HGB 8.1* 8.0* 8.3* 8.1* 7.4*  HCT 24.0* 24.1* 24.5* 23.1* 21.6*  MCV 91.3 89.6  89.1 89.5 89.3  PLT 349 374 427* 508* 533*   Cardiac Enzymes:  Recent Labs Lab 08/16/14 1330  CKTOTAL 45   BNP (last 3 results) No results for input(s): BNP in the last 8760 hours.  ProBNP (last 3 results) No results for input(s): PROBNP in the last 8760 hours.  CBG:  Recent Labs Lab 08/18/14 2210 08/19/14 0812 08/19/14 1212 08/19/14 1433 08/19/14 1654  GLUCAP 101* 88 89 90 106*    Recent Results (from the past 240 hour(s))  Culture, routine-abscess     Status: None   Collection Time: 08/10/14 11:54 AM  Result Value Ref Range Status   Specimen Description SHOULDER LEFT  Final   Special Requests NONE  Final   Gram Stain   Final    ABUNDANT WBC PRESENT, PREDOMINANTLY PMN NO SQUAMOUS EPITHELIAL CELLS SEEN MODERATE GRAM POSITIVE COCCI IN PAIRS IN CLUSTERS Performed at [facility]    Culture   Final    MODERATE STAPHYLOCOCCUS AUREUS Note: RIFAMPIN AND GENTAMICIN SHOULD NOT BE USED AS SINGLE DRUGS FOR TREATMENT OF STAPH INFECTIONS. Performed at [facility]    Report Status 08/13/2014 FINAL  Final   Organism ID, Bacteria STAPHYLOCOCCUS AUREUS  Final      Susceptibility   Staphylococcus aureus - MIC*    CLINDAMYCIN <=0.25 SENSITIVE Sensitive     ERYTHROMYCIN <=0.25 SENSITIVE Sensitive     GENTAMICIN <=0.5 SENSITIVE Sensitive     LEVOFLOXACIN 0.25 SENSITIVE Sensitive     OXACILLIN 0.5 SENSITIVE Sensitive     PENICILLIN >=0.5 RESISTANT Resistant     RIFAMPIN <=0.5 SENSITIVE Sensitive     TRIMETH/SULFA <=10 SENSITIVE Sensitive     VANCOMYCIN 1 SENSITIVE Sensitive     TETRACYCLINE <=1 SENSITIVE Sensitive     MOXIFLOXACIN <=0.25 SENSITIVE Sensitive     * MODERATE STAPHYLOCOCCUS AUREUS  Anaerobic culture     Status: None (Preliminary result)   Collection Time: 08/10/14 11:54 AM  Result Value Ref Range Status   Specimen Description SHOULDER LEFT  Final   Special Requests NONE  Final   Gram Stain   Final    MODERATE WBC PRESENT, PREDOMINANTLY  PMN NO SQUAMOUS EPITHELIAL CELLS SEEN MODERATE GRAM POSITIVE COCCI IN PAIRS IN CLUSTERS Performed at [facility]    Culture   Final    No Anaerobes Isolated; Culture in Progress for 14 DAYS Performed at [facility]    Report Status PENDING  Incomplete  Body fluid culture     Status: None   Collection Time: 08/10/14  5:22 PM  Result Value Ref Range Status   Specimen Description SYNOVIAL JOINT RIGHT KNEE  Final   Special Requests Normal  Final   Gram Stain   Final    ABUNDANT WBC PRESENT,BOTH PMN AND MONONUCLEAR NO ORGANISMS SEEN Performed at [facility]    Culture   Final    FEW STAPHYLOCOCCUS AUREUS Note: RIFAMPIN AND GENTAMICIN SHOULD NOT  BE USED AS SINGLE DRUGS FOR TREATMENT OF STAPH INFECTIONS. CRITICAL RESULT CALLED TO, READ BACK BY AND VERIFIED WITH: CINDY FLORES RN 08/13/14 AT 10 AM BY [facility] Performed at [facility]    Report Status 08/14/2014 FINAL  Final   Organism ID, Bacteria STAPHYLOCOCCUS AUREUS  Final      Susceptibility   Staphylococcus aureus - MIC*    CLINDAMYCIN <=0.25 SENSITIVE Sensitive     ERYTHROMYCIN <=0.25 SENSITIVE Sensitive     GENTAMICIN <=0.5 SENSITIVE Sensitive     LEVOFLOXACIN 0.25 SENSITIVE Sensitive     OXACILLIN 0.5 SENSITIVE Sensitive     PENICILLIN >=0.5 RESISTANT Resistant     RIFAMPIN <=0.5 SENSITIVE Sensitive     TRIMETH/SULFA <=10 SENSITIVE Sensitive     VANCOMYCIN <=0.5 SENSITIVE Sensitive     TETRACYCLINE <=1 SENSITIVE Sensitive     MOXIFLOXACIN <=0.25 SENSITIVE Sensitive     * FEW STAPHYLOCOCCUS AUREUS  Surgical pcr screen     Status: Abnormal   Collection Time: 08/10/14  6:42 PM  Result Value Ref Range Status   MRSA, PCR NEGATIVE NEGATIVE Final   Staphylococcus aureus POSITIVE (A) NEGATIVE Final    Comment:        The Xpert SA Assay (FDA approved for NASAL specimens in patients over 63 years of age), is one component of a comprehensive surveillance program.  Test performance has been  validated by [facility] for patients greater than or equal to 38 year old. It is not intended to diagnose infection nor to guide or monitor treatment.   Body fluid culture     Status: None   Collection Time: 08/10/14  9:14 PM  Result Value Ref Range Status   Specimen Description FLUID LEFT AC JOINT  Final   Special Requests VANCO, ROCEPHIN  Final   Gram Stain   Final    FEW WBC PRESENT, PREDOMINANTLY PMN MODERATE GRAM POSITIVE COCCI IN PAIRS IN CLUSTERS Gram Stain Report Called to,Read Back By and Verified With: Gram Stain Report Called to,Read Back By and Verified With: Laurance Flatten RN 08/11/13 9:10AM BY [place] Performed at [facility]    Culture   Final    ABUNDANT STAPHYLOCOCCUS AUREUS Note: RIFAMPIN AND GENTAMICIN SHOULD NOT BE USED AS SINGLE DRUGS FOR TREATMENT OF STAPH INFECTIONS. Performed at [facility]    Report Status 08/13/2014 FINAL  Final   Organism ID, Bacteria STAPHYLOCOCCUS AUREUS  Final      Susceptibility   Staphylococcus aureus - MIC*    CLINDAMYCIN <=0.25 SENSITIVE Sensitive     ERYTHROMYCIN <=0.25 SENSITIVE Sensitive     GENTAMICIN <=0.5 SENSITIVE Sensitive     LEVOFLOXACIN 0.25 SENSITIVE Sensitive     OXACILLIN <=0.25 SENSITIVE Sensitive     PENICILLIN >=0.5 RESISTANT Resistant     RIFAMPIN <=0.5 SENSITIVE Sensitive     TRIMETH/SULFA <=10 SENSITIVE Sensitive     VANCOMYCIN 1 SENSITIVE Sensitive     TETRACYCLINE <=1 SENSITIVE Sensitive     MOXIFLOXACIN <=0.25 SENSITIVE Sensitive     * ABUNDANT STAPHYLOCOCCUS AUREUS  Anaerobic culture     Status: None   Collection Time: 08/10/14  9:14 PM  Result Value Ref Range Status   Specimen Description FLUID LEFT AC JOINT  Final   Special Requests VANCO, ROCEPHIN  Final   Gram Stain   Final    FEW WBC PRESENT, PREDOMINANTLY PMN MODERATE GRAM POSITIVE COCCI IN PAIRS IN CLUSTERS Performed at [facility]    Culture   Final  NO ANAEROBES ISOLATED Performed at [facility]     Report Status 08/15/2014 FINAL  Final  Culture, blood (routine x 2)     Status: None   Collection Time: 08/11/14  7:00 AM  Result Value Ref Range Status   Specimen Description BLOOD RIGHT ARM  Final   Special Requests BOTTLES DRAWN AEROBIC AND ANAEROBIC 6CC  Final   Culture   Final    NO GROWTH 5 DAYS Performed at [facility]    Report Status 08/17/2014 FINAL  Final  Culture, blood (routine x 2)     Status: None   Collection Time: 08/11/14  7:08 AM  Result Value Ref Range Status   Specimen Description BLOOD LEFT HAND  Final   Special Requests BOTTLES DRAWN AEROBIC AND ANAEROBIC [place]  Final   Culture   Final    NO GROWTH 5 DAYS Performed at [facility]    Report Status 08/17/2014 FINAL  Final  Culture, Urine     Status: None   Collection Time: 08/12/14  5:47 AM  Result Value Ref Range Status   Specimen Description URINE, RANDOM  Final   Special Requests NONE  Final   Colony Count   Final    3,000 COLONIES/ML Performed at [facility]    Culture   Final    INSIGNIFICANT GROWTH Performed at [facility]    Report Status 08/13/2014 FINAL  Final  Culture, blood (routine x 2)     Status: None (Preliminary result)   Collection Time: 08/15/14  1:15 PM  Result Value Ref Range Status   Specimen Description BLOOD RIGHT HAND  Final   Special Requests BOTTLES DRAWN AEROBIC ONLY 3CC  Final   Culture   Final           BLOOD CULTURE RECEIVED NO GROWTH TO DATE CULTURE WILL BE HELD FOR 5 DAYS BEFORE ISSUING A FINAL NEGATIVE REPORT Performed at [facility]    Report Status PENDING  Incomplete  Culture, blood (routine x 2)     Status: None (Preliminary result)   Collection Time: 08/15/14  3:11 PM  Result Value Ref Range Status   Specimen Description BLOOD RIGHT HAND  Final   Special Requests   Final    BOTTLES DRAWN AEROBIC AND ANAEROBIC BLUE 10CC RED 5CC   Culture   Final           BLOOD CULTURE RECEIVED NO GROWTH TO DATE CULTURE WILL BE  HELD FOR 5 DAYS BEFORE ISSUING A FINAL NEGATIVE REPORT Performed at [facility]    Report Status PENDING  Incomplete  Culture, Urine     Status: None   Collection Time: 08/17/14  3:14 PM  Result Value Ref Range Status   Specimen Description URINE, CATHETERIZED  Final   Special Requests NONE  Final   Colony Count NO GROWTH Performed at [facility]   Final   Culture NO GROWTH Performed at [facility]   Final   Report Status 08/18/2014 FINAL  Final  Body fluid culture     Status: None (Preliminary result)   Collection Time: 08/18/14  4:52 PM  Result Value Ref Range Status   Specimen Description FLUID SYNOVIAL LEFT KNEE  Final   Special Requests Normal  Final   Gram Stain   Final    MODERATE WBC PRESENT,BOTH PMN AND MONONUCLEAR NO ORGANISMS SEEN Performed at [facility]    Culture NO GROWTH Performed at [facility]   Final   Report Status PENDING  Incomplete     Studies: US Biopsy  08/18/2014   CLINICAL DATA:  Acute renal failure and need for renal biopsy.  EXAM: ULTRASOUND GUIDED CORE BIOPSY OF RIGHT KIDNEY  MEDICATIONS: 1.0 mg IV Versed; 50 mcg IV Fentanyl  Total Moderate Sedation Time: 13 minutes  PROCEDURE: The procedure, risks, benefits, and alternatives were explained to the patient. Questions regarding the procedure were encouraged and answered. The patient understands and consents to the procedure.  The right posterior flank region was prepped with Betadine in a sterile fashion, and a sterile drape was applied covering the operative field. A sterile gown and sterile gloves were used for the procedure. Local anesthesia was provided with 1% Lidocaine. A time-out was performed prior to the procedure.  Ultrasound was performed of both kidneys from a posterior approach. The right kidney was chosen for sampling. Under ultrasound guidance, 2 separate 16 gauge core biopsy passes were obtained of lower pole cortex. Core biopsy samples were  submitted in saline for nephropathologic analysis. Post biopsy imaging was performed with ultrasound.  COMPLICATIONS: None.  FINDINGS: The right renal anatomy was more favorable for biopsy compared to the left. Intact core biopsy samples were obtained. There were no immediate bleeding complications.  IMPRESSION: Ultrasound-guided core biopsy performed of lower pole cortex of the right kidney.   Electronically Signed   By: Aletta Edouard M.D.   On: 08/18/2014 17:11   Ir Fluoro Guide Cv Line Right  08/18/2014   CLINICAL DATA:  Acute renal failure and need for non tunneled hemodialysis catheter.  EXAM: NON-TUNNELED CENTRAL VENOUS CATHETER PLACEMENT WITH ULTRASOUND AND FLUOROSCOPIC GUIDANCE  FLUOROSCOPY TIME:  6 seconds.  PROCEDURE: The procedure, risks, benefits, and alternatives were explained to the patient. Questions regarding the procedure were encouraged and answered. The patient understands and consents to the procedure.  The right neck and chest were prepped with chlorhexidine in a sterile fashion, and a sterile drape was applied covering the operative field. Maximum barrier sterile technique with sterile gowns and gloves were used for the procedure. Local anesthesia was provided with 1% lidocaine. A time-out was performed prior to the procedure.  After creating a small venotomy incision, a 19 gauge needle was advanced into the right internal jugular vein under direct, real-time ultrasound guidance. Ultrasound image documentation was performed. After securing guidewire access, the venotomy was dilated. A 13 French, 20 cm length Trialysis catheter was then advanced over the wire. Catheter position was confirmed by a fluoroscopic spot image.  The catheter was aspirated, flushed with saline, and injected with appropriate volume heparin dwells. The catheter exit site was secured with 0-Prolene retention sutures.  COMPLICATIONS: None.  No pneumothorax.  FINDINGS: After catheter placement, the tip lies at the  cavoatrial junction. The catheter aspirates normally and is ready for immediate use.  IMPRESSION: Placement of non-tunneled central venous dialysis catheter via the right internal jugular vein. The catheter tip lies at the cavoatrial junction. The catheter is ready for immediate use.   Electronically Signed   By: Aletta Edouard M.D.   On: 08/18/2014 17:02   Ir US Guide Vasc Access Right  08/18/2014   CLINICAL DATA:  Acute renal failure and need for non tunneled hemodialysis catheter.  EXAM: NON-TUNNELED CENTRAL VENOUS CATHETER PLACEMENT WITH ULTRASOUND AND FLUOROSCOPIC GUIDANCE  FLUOROSCOPY TIME:  6 seconds.  PROCEDURE: The procedure, risks, benefits, and alternatives were explained to the patient. Questions regarding the procedure were encouraged and answered. The  patient understands and consents to the procedure.  The right neck and chest were prepped with chlorhexidine in a sterile fashion, and a sterile drape was applied covering the operative field. Maximum barrier sterile technique with sterile gowns and gloves were used for the procedure. Local anesthesia was provided with 1% lidocaine. A time-out was performed prior to the procedure.  After creating a small venotomy incision, a 19 gauge needle was advanced into the right internal jugular vein under direct, real-time ultrasound guidance. Ultrasound image documentation was performed. After securing guidewire access, the venotomy was dilated. A 13 French, 20 cm length Trialysis catheter was then advanced over the wire. Catheter position was confirmed by a fluoroscopic spot image.  The catheter was aspirated, flushed with saline, and injected with appropriate volume heparin dwells. The catheter exit site was secured with 0-Prolene retention sutures.  COMPLICATIONS: None.  No pneumothorax.  FINDINGS: After catheter placement, the tip lies at the cavoatrial junction. The catheter aspirates normally and is ready for immediate use.  IMPRESSION: Placement of  non-tunneled central venous dialysis catheter via the right internal jugular vein. The catheter tip lies at the cavoatrial junction. The catheter is ready for immediate use.   Electronically Signed   By: Aletta Edouard M.D.   On: 08/18/2014 17:02   US Abdomen Complete W/elastography  08/18/2014   CLINICAL DATA:  Chronic hepatitis C  EXAM: ULTRASOUND ABDOMEN  ULTRASOUND HEPATIC ELASTOGRAPHY  TECHNIQUE: Sonography of the upper abdomen was performed. In addition, ultrasound elastography evaluation of the liver was performed. A region of interest was placed within the right lobe of the liver. Following application of a compressive sonographic pulse, shear waves were detected in the adjacent hepatic tissue and the shear wave velocity was calculated. Multiple assessments were performed at the selected site. Median shear wave velocity is correlated to a Metavir fibrosis score.  COMPARISON:  None.  FINDINGS: ULTRASOUND ABDOMEN  Gallbladder: Surgically absent.  Common bile duct: Diameter: 6 mm  Liver: No focal lesion identified. Within normal limits in parenchymal echogenicity.  IVC: No abnormality visualized.  Pancreas: Incompletely visualized but grossly unremarkable.  Spleen: Size and appearance within normal limits.  Right Kidney: Length: 13.5 cm. Echogenic renal parenchyma, suggesting medical renal disease. No mass or hydronephrosis.  Left Kidney: Length: 12.8 cm. Echogenic renal parenchyma, suggesting medical renal disease. No mass or hydronephrosis.  Abdominal aorta: No aneurysm visualized.  Other findings: None.  ULTRASOUND HEPATIC ELASTOGRAPHY  Device: Siemens Helix VTQ  Transducer 4V1  Patient position: Supine  Number of measurements:  10  Hepatic Segment:  8  Median velocity:   2.95  m/sec  IQR: 0.51  IQR/Median velocity ratio 0.172  Corresponding Metavir fibrosis score:  F3/F4  Risk of fibrosis: High  Limitations of exam: None  Pertinent findings noted on other imaging exams:  None  Please note that abnormal  shear wave velocities may also be identified in clinical settings other than with hepatic fibrosis, such as: acute hepatitis, elevated right heart and central venous pressures including use of beta blockers, veno-occlusive disease (Budd-Chiari), infiltrative processes such as mastocytosis/amyloidosis/infiltrative tumor, extrahepatic cholestasis, in the post-prandial state, and liver transplantation. Correlation with patient history, laboratory data, and clinical condition recommended.  IMPRESSION: Echogenic renal parenchyma, suggesting medical renal disease.  Status post cholecystectomy.  Median hepatic shear wave velocity is calculated at 2.95 m/sec.  Corresponding Metavir fibrosis score is F3/F4.  Risk of fibrosis is high.  Follow-up:  Follow-up advised.   Electronically Signed   By: Julian Hy  M.D.   On: 08/18/2014 16:49    Scheduled Meds: . amLODipine  10 mg Oral Daily  . antiseptic oral rinse  7 mL Mouth Rinse q12n4p  . aspirin  81 mg Oral Daily  .  ceFAZolin (ANCEF) IV  2 g Intravenous Q12H  . chlorhexidine  15 mL Mouth Rinse BID  . cyclobenzaprine  5 mg Oral BID  . famotidine  20 mg Oral Daily  . ferrous gluconate  324 mg Oral Q breakfast  . hydrALAZINE  25 mg Oral TID  . HYDROmorphone      . insulin aspart  0-9 Units Subcutaneous TID WC  . nicotine  21 mg Transdermal Daily  . polyethylene glycol  17 g Oral BID  . sodium chloride  3 mL Intravenous Q12H   Continuous Infusions: . sodium chloride 100 mL/hr (08/19/14 1559)    Principal Problem:   Staphylococcus aureus bacteremia Active Problems:   Septic joint of left shoulder region   Sinus tachycardia   Septic joint of right knee joint   CKD (chronic kidney disease) stage 3, GFR 30-59 ml/min   Septic joint of right hand   SIRS (systemic inflammatory response syndrome)   Benign essential HTN   Hepatitis C   Acute renal failure    [facility], [facility] Pager [phone]. If 7PM-7AM, please contact  night-coverage at [email or web address], password [facility] 08/19/2014, 5:45 PM  LOS: 12 days

## 2014-08-19 NOTE — Progress Notes (Signed)
Dr. [name] notified that pt is bleeding from left I & D surgery site (covered with ace wrap top third of dressing saturated and blood in bed as well).   Hemovac is intact and draining.  Verbal order received to reinforce dressing and monitor pt.

## 2014-08-20 LAB — CBC
HCT: 21.3 % — ABNORMAL LOW (ref 36.0–46.0)
Hemoglobin: 7.5 g/dL — ABNORMAL LOW (ref 12.0–15.0)
MCH: 31.1 pg (ref 26.0–34.0)
MCHC: 35.2 g/dL (ref 30.0–36.0)
MCV: 88.4 fL (ref 78.0–100.0)
Platelets: 534 10*3/uL — ABNORMAL HIGH (ref 150–400)
RBC: 2.41 MIL/uL — ABNORMAL LOW (ref 3.87–5.11)
RDW: 14.6 % (ref 11.5–15.5)
WBC: 19 10*3/uL — ABNORMAL HIGH (ref 4.0–10.5)

## 2014-08-20 LAB — RENAL FUNCTION PANEL
Albumin: 1.1 g/dL — ABNORMAL LOW (ref 3.5–5.2)
Anion gap: 11 (ref 5–15)
BUN: 55 mg/dL — ABNORMAL HIGH (ref 6–23)
CO2: 20 mmol/L (ref 19–32)
Calcium: 7.2 mg/dL — ABNORMAL LOW (ref 8.4–10.5)
Chloride: 99 mmol/L (ref 96–112)
Creatinine, Ser: 6.92 mg/dL — ABNORMAL HIGH (ref 0.50–1.10)
GFR calc Af Amer: 7 mL/min — ABNORMAL LOW (ref >90)
GFR calc non Af Amer: 6 mL/min — ABNORMAL LOW (ref >90)
Glucose, Bld: 135 mg/dL — ABNORMAL HIGH (ref 70–99)
Phosphorus: 9 mg/dL — ABNORMAL HIGH (ref 2.3–4.6)
Potassium: 4.8 mmol/L (ref 3.5–5.1)
Sodium: 130 mmol/L — ABNORMAL LOW (ref 135–145)

## 2014-08-20 LAB — GLUCOSE, CAPILLARY
Glucose-Capillary: 128 mg/dL — ABNORMAL HIGH (ref 70–99)
Glucose-Capillary: 137 mg/dL — ABNORMAL HIGH (ref 70–99)
Glucose-Capillary: 125 mg/dL — ABNORMAL HIGH (ref 70–99)
Glucose-Capillary: 137 mg/dL — ABNORMAL HIGH (ref 70–99)

## 2014-08-20 LAB — HEPATITIS B SURFACE ANTIBODY,QUALITATIVE: Hep B S Ab: NONREACTIVE

## 2014-08-20 LAB — ABO/RH: ABO/RH(D): A POS

## 2014-08-20 LAB — PREPARE RBC (CROSSMATCH)

## 2014-08-20 LAB — HEPATITIS B CORE ANTIBODY, TOTAL: Hep B Core Total Ab: NEGATIVE

## 2014-08-20 MED ORDER — FUROSEMIDE 10 MG/ML IJ SOLN
120.0000 mg | Freq: Once | INTRAMUSCULAR | Status: AC
  Administered 2014-08-20: 120 mg via INTRAVENOUS
  Filled 2014-08-20: qty 12

## 2014-08-20 MED ORDER — SODIUM CHLORIDE 0.9 % IJ SOLN
10.0000 mL | Freq: Two times a day (BID) | INTRAMUSCULAR | Status: DC
  Administered 2014-08-20 – 2014-09-13 (×15): 10 mL

## 2014-08-20 MED ORDER — SORBITOL 70 % SOLN: 30.0000 mL | Freq: Every day | Status: DC | PRN

## 2014-08-20 MED ORDER — GI COCKTAIL ~~LOC~~
30.0000 mL | Freq: Three times a day (TID) | ORAL | Status: DC | PRN
  Administered 2014-08-20 – 2014-09-11 (×6): 30 mL via ORAL
  Filled 2014-08-20 (×10): qty 30

## 2014-08-20 MED ORDER — ONDANSETRON HCL 4 MG/2ML IJ SOLN
4.0000 mg | Freq: Four times a day (QID) | INTRAMUSCULAR | Status: DC | PRN
  Administered 2014-08-20 – 2014-09-12 (×13): 4 mg via INTRAVENOUS
  Filled 2014-08-20 (×14): qty 2

## 2014-08-20 NOTE — Progress Notes (Signed)
Patient ID: [name], female   DOB: [birth year], 59 y.o.   MRN: [id number] S:c/o diffuse pain O:BP 146/71 mmHg  Pulse 95  Temp(Src) 97.9 F (36.6 C) (Oral)  Resp 15  Ht 5' 7.5" (1.715 m)  Wt 90.22 kg (198 lb 14.4 oz)  BMI 30.67 kg/m2  SpO2 100%  LMP 03/16/2002  Intake/Output Summary (Last 24 hours) at 08/20/14 1300 Last data filed at 08/20/14 1149  Gross per 24 hour  Intake   1355 ml  Output    160 ml  Net   1195 ml   Intake/Output: I/O last 3 completed shifts: In: 595 [P.O.:60; I.V.:200; Blood:335] Out: 510 [Urine:500; Blood:10]  Intake/Output this shift:  Total I/O In: 820 [P.O.:820] Out: -  Weight change:  Gen:WD edemetous AAF in NAD CVS:no rub Resp:decreased BS at bases Abd:+BS, soft, NT Ext:+anasarca   Recent Labs Lab 08/15/14 1511 08/16/14 0500 08/17/14 0525 08/18/14 0512 08/18/14 2305 08/19/14 1101 08/20/14 1115  NA 127* 127* 128* 128* 131* 131* 130*  K 3.4* 3.5 3.9 4.0 3.8 3.8 4.8  CL 96 94* 97 100 100 100 99  CO2 24 22 17* 15* 18* 16* 20  GLUCOSE 217* 205* 121* 110* 97 89 135*  BUN 41* 46* 54* 62* 44* 49* 55*  CREATININE 3.11* 4.12* 5.28* 6.54* 5.25* 5.94* 6.92*  ALBUMIN  --  1.1*  --   --   --  1.2* 1.1*  CALCIUM 7.9* 7.8* 7.7* 7.4* 7.6* 7.2* 7.2*  PHOS  --  5.9*  --   --   --  6.8* 9.0*   Liver Function Tests:  Recent Labs Lab 08/16/14 0500 08/19/14 1101 08/20/14 1115  ALBUMIN 1.1* 1.2* 1.1*   No results for input(s): LIPASE, AMYLASE in the last 168 hours. No results for input(s): AMMONIA in the last 168 hours. CBC:  Recent Labs Lab 08/15/14 1511 08/16/14 0550 08/17/14 0525 08/18/14 0512 08/19/14 0431 08/19/14 2005 08/20/14 0915  WBC 13.2* 12.4* 11.1* 12.5* 12.3*  --  19.0*  NEUTROABS 10.6*  --   --  9.6*  --   --   --   HGB 8.1* 8.0* 8.3* 8.1* 7.4* 6.2* 7.5*  HCT 24.0* 24.1* 24.5* 23.1* 21.6* 18.3* 21.3*  MCV 91.3 89.6 89.1 89.5 89.3  --  88.4  PLT 349 374 427* 508* 533*  --  534*   Cardiac Enzymes:  Recent Labs Lab  08/16/14 1330  CKTOTAL 45   CBG:  Recent Labs Lab 08/19/14 1433 08/19/14 1654 08/19/14 2153 08/20/14 0801 08/20/14 1148  GLUCAP 90 106* 119* 128* 137*    Iron Studies: No results for input(s): IRON, TIBC, TRANSFERRIN, FERRITIN in the last 72 hours. Studies/Results: US Biopsy  08/18/2014   CLINICAL DATA:  Acute renal failure and need for renal biopsy.  EXAM: ULTRASOUND GUIDED CORE BIOPSY OF RIGHT KIDNEY  MEDICATIONS: 1.0 mg IV Versed; 50 mcg IV Fentanyl  Total Moderate Sedation Time: 13 minutes  PROCEDURE: The procedure, risks, benefits, and alternatives were explained to the patient. Questions regarding the procedure were encouraged and answered. The patient understands and consents to the procedure.  The right posterior flank region was prepped with Betadine in a sterile fashion, and a sterile drape was applied covering the operative field. A sterile gown and sterile gloves were used for the procedure. Local anesthesia was provided with 1% Lidocaine. A time-out was performed prior to the procedure.  Ultrasound was performed of both kidneys from a posterior approach. The right kidney was chosen for sampling. Under  ultrasound guidance, 2 separate 16 gauge core biopsy passes were obtained of lower pole cortex. Core biopsy samples were submitted in saline for nephropathologic analysis. Post biopsy imaging was performed with ultrasound.  COMPLICATIONS: None.  FINDINGS: The right renal anatomy was more favorable for biopsy compared to the left. Intact core biopsy samples were obtained. There were no immediate bleeding complications.  IMPRESSION: Ultrasound-guided core biopsy performed of lower pole cortex of the right kidney.   Electronically Signed   By: Aletta Edouard M.D.   On: 08/18/2014 17:11   Ir Fluoro Guide Cv Line Right  08/18/2014   CLINICAL DATA:  Acute renal failure and need for non tunneled hemodialysis catheter.  EXAM: NON-TUNNELED CENTRAL VENOUS CATHETER PLACEMENT WITH ULTRASOUND AND  FLUOROSCOPIC GUIDANCE  FLUOROSCOPY TIME:  6 seconds.  PROCEDURE: The procedure, risks, benefits, and alternatives were explained to the patient. Questions regarding the procedure were encouraged and answered. The patient understands and consents to the procedure.  The right neck and chest were prepped with chlorhexidine in a sterile fashion, and a sterile drape was applied covering the operative field. Maximum barrier sterile technique with sterile gowns and gloves were used for the procedure. Local anesthesia was provided with 1% lidocaine. A time-out was performed prior to the procedure.  After creating a small venotomy incision, a 19 gauge needle was advanced into the right internal jugular vein under direct, real-time ultrasound guidance. Ultrasound image documentation was performed. After securing guidewire access, the venotomy was dilated. A 13 French, 20 cm length Trialysis catheter was then advanced over the wire. Catheter position was confirmed by a fluoroscopic spot image.  The catheter was aspirated, flushed with saline, and injected with appropriate volume heparin dwells. The catheter exit site was secured with 0-Prolene retention sutures.  COMPLICATIONS: None.  No pneumothorax.  FINDINGS: After catheter placement, the tip lies at the cavoatrial junction. The catheter aspirates normally and is ready for immediate use.  IMPRESSION: Placement of non-tunneled central venous dialysis catheter via the right internal jugular vein. The catheter tip lies at the cavoatrial junction. The catheter is ready for immediate use.   Electronically Signed   By: Aletta Edouard M.D.   On: 08/18/2014 17:02   Ir US Guide Vasc Access Right  08/18/2014   CLINICAL DATA:  Acute renal failure and need for non tunneled hemodialysis catheter.  EXAM: NON-TUNNELED CENTRAL VENOUS CATHETER PLACEMENT WITH ULTRASOUND AND FLUOROSCOPIC GUIDANCE  FLUOROSCOPY TIME:  6 seconds.  PROCEDURE: The procedure, risks, benefits, and alternatives  were explained to the patient. Questions regarding the procedure were encouraged and answered. The patient understands and consents to the procedure.  The right neck and chest were prepped with chlorhexidine in a sterile fashion, and a sterile drape was applied covering the operative field. Maximum barrier sterile technique with sterile gowns and gloves were used for the procedure. Local anesthesia was provided with 1% lidocaine. A time-out was performed prior to the procedure.  After creating a small venotomy incision, a 19 gauge needle was advanced into the right internal jugular vein under direct, real-time ultrasound guidance. Ultrasound image documentation was performed. After securing guidewire access, the venotomy was dilated. A 13 French, 20 cm length Trialysis catheter was then advanced over the wire. Catheter position was confirmed by a fluoroscopic spot image.  The catheter was aspirated, flushed with saline, and injected with appropriate volume heparin dwells. The catheter exit site was secured with 0-Prolene retention sutures.  COMPLICATIONS: None.  No pneumothorax.  FINDINGS: After catheter placement, the  tip lies at the cavoatrial junction. The catheter aspirates normally and is ready for immediate use.  IMPRESSION: Placement of non-tunneled central venous dialysis catheter via the right internal jugular vein. The catheter tip lies at the cavoatrial junction. The catheter is ready for immediate use.   Electronically Signed   By: Aletta Edouard M.D.   On: 08/18/2014 17:02   US Abdomen Complete W/elastography  08/18/2014   CLINICAL DATA:  Chronic hepatitis C  EXAM: ULTRASOUND ABDOMEN  ULTRASOUND HEPATIC ELASTOGRAPHY  TECHNIQUE: Sonography of the upper abdomen was performed. In addition, ultrasound elastography evaluation of the liver was performed. A region of interest was placed within the right lobe of the liver. Following application of a compressive sonographic pulse, shear waves were detected in  the adjacent hepatic tissue and the shear wave velocity was calculated. Multiple assessments were performed at the selected site. Median shear wave velocity is correlated to a Metavir fibrosis score.  COMPARISON:  None.  FINDINGS: ULTRASOUND ABDOMEN  Gallbladder: Surgically absent.  Common bile duct: Diameter: 6 mm  Liver: No focal lesion identified. Within normal limits in parenchymal echogenicity.  IVC: No abnormality visualized.  Pancreas: Incompletely visualized but grossly unremarkable.  Spleen: Size and appearance within normal limits.  Right Kidney: Length: 13.5 cm. Echogenic renal parenchyma, suggesting medical renal disease. No mass or hydronephrosis.  Left Kidney: Length: 12.8 cm. Echogenic renal parenchyma, suggesting medical renal disease. No mass or hydronephrosis.  Abdominal aorta: No aneurysm visualized.  Other findings: None.  ULTRASOUND HEPATIC ELASTOGRAPHY  Device: Siemens Helix VTQ  Transducer 4V1  Patient position: Supine  Number of measurements:  10  Hepatic Segment:  8  Median velocity:   2.95  m/sec  IQR: 0.51  IQR/Median velocity ratio 0.172  Corresponding Metavir fibrosis score:  F3/F4  Risk of fibrosis: High  Limitations of exam: None  Pertinent findings noted on other imaging exams:  None  Please note that abnormal shear wave velocities may also be identified in clinical settings other than with hepatic fibrosis, such as: acute hepatitis, elevated right heart and central venous pressures including use of beta blockers, veno-occlusive disease (Budd-Chiari), infiltrative processes such as mastocytosis/amyloidosis/infiltrative tumor, extrahepatic cholestasis, in the post-prandial state, and liver transplantation. Correlation with patient history, laboratory data, and clinical condition recommended.  IMPRESSION: Echogenic renal parenchyma, suggesting medical renal disease.  Status post cholecystectomy.  Median hepatic shear wave velocity is calculated at 2.95 m/sec.  Corresponding Metavir  fibrosis score is F3/F4.  Risk of fibrosis is high.  Follow-up:  Follow-up advised.   Electronically Signed   By: Julian Hy M.D.   On: 08/18/2014 16:49   . amLODipine  10 mg Oral Daily  . antiseptic oral rinse  7 mL Mouth Rinse q12n4p  . aspirin  81 mg Oral Daily  .  ceFAZolin (ANCEF) IV  2 g Intravenous Q12H  . chlorhexidine  15 mL Mouth Rinse BID  . cyclobenzaprine  5 mg Oral BID  . famotidine  20 mg Oral Daily  . ferrous gluconate  324 mg Oral Q breakfast  . hydrALAZINE  25 mg Oral TID  . insulin aspart  0-9 Units Subcutaneous TID WC  . nicotine  21 mg Transdermal Daily  . polyethylene glycol  17 g Oral BID  . sodium chloride  10-40 mL Intracatheter Q12H  . sodium chloride  3 mL Intravenous Q12H    BMET    Component Value Date/Time   NA 130* 08/20/2014 1115   K 4.8 08/20/2014 1115  CL 99 08/20/2014 1115   CO2 20 08/20/2014 1115   GLUCOSE 135* 08/20/2014 1115   BUN 55* 08/20/2014 1115   CREATININE 6.92* 08/20/2014 1115   CREATININE 1.15* 02/27/2014 1203   CALCIUM 7.2* 08/20/2014 1115   GFRNONAA 6* 08/20/2014 1115   GFRNONAA 52* 02/27/2014 1203   GFRAA 7* 08/20/2014 1115   GFRAA 60 02/27/2014 1203   CBC    Component Value Date/Time   WBC 19.0* 08/20/2014 0915   RBC 2.41* 08/20/2014 0915   RBC 3.77* 08/07/2014 2114   HGB 7.5* 08/20/2014 0915   HCT 21.3* 08/20/2014 0915   PLT 534* 08/20/2014 0915   MCV 88.4 08/20/2014 0915   MCH 31.1 08/20/2014 0915   MCHC 35.2 08/20/2014 0915   RDW 14.6 08/20/2014 0915   LYMPHSABS 1.7 08/18/2014 0512   MONOABS 1.2* 08/18/2014 0512   EOSABS 0.1 08/18/2014 0512   BASOSABS 0.0 08/18/2014 0512     Assessment/Plan: 1. 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 Will also check tox screen to r/o cocaine use  (levimasole associated with ANCA-+ vasculitis) 1. S/p renal biopsy 08/18/14 2. First HD session 08/18/14, had surgery on 08/19/14, will plan for another session of HD 08/21/14 with UF. 3. Drop in UOP over the last 24 hours following ABLA and Scr continues to climb. 4. 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 Appreciate IR's assistance with renal biopsy and HD cath (untunneled for now in hopes that her renal function will improve as well as her polyarticular septic arthritis/active infection). 2. MSSA polyarticular septic arthritis - without underlying source of infection (negative TEE). ID following 1. For surgery today to drain left knee. 3. ABLA on anemia of chronic disease- s/p transfusion and cont to transfuse prn.   4. Gross hematuria- following renal biopsy. Continue to follow h/h and hopefully urine will start to clear if not she may need a 3 way foley to flush clots. 5. Hep C + with >8 million copies by PCR, ID following, cryoglobulins pending 6. HTN- stable 7. Anemia of chronic disease- will start aranesp and check iron stores 8. Hyponatremia- related to AKI and edema. Add lasix to IVF"s and follow. 9. DM- historically poorly controlled. Per primary svc  Farooq Petrovich A

## 2014-08-20 NOTE — Progress Notes (Signed)
[facility] PROGRESS NOTE  Brandy Houston [id number] DOB: 01-Jan-1956 DOA: 08/07/2014 PCP: Chari Manning, NP  Assessment/Plan: MSSA bacteremia with sepsis and Polyarticular septic arthrits:Initally admitted with acute left shoulder pain, further work up with a MRI of left shoulder demonstrated a septic AC joint.Subsequent blood cultures positive for MSSA. ID and Orthopedics were consulted. Blood cultures on 2/24 was also positive for MSSA. Patient meanwhile started having pain/swelling of her B/L knee joints, Right should joint as well. Further workup demonstarted septic arthritis of these joints.Patient subsequently underwent irrigation and debridement of Right knee, left knee, Right AC & left AC joints, and Irrigation and debridement of right hand flexor tendon sheath. Underwent TEE on 2/29 that was negative. Infectious disease recently recommended switching from Ancef to nafcillin. With worsening renal function, ID has changed abx back to ancef. - ID recommended joint aspiration of R knee given increased swelling and intermittent fevers with R knee aspirated on 3/4 - fluid with 33k WBC's with no growth thus far - R knee underwent arthroscopic irrigation and debridement on 3/5    Anemia:secondary to acute illness/CKD/post-op bleeding  -Bleeding noted at post-op site from R knee. -Hgb down to 6.2 and one unit of prbc's transfused with appropriate correction   DM-2:CBG's controlled with SSI. Had stopped Glipizide with plans to resume on discharge. A1c 8.4.   HTN: Overall remains stable with hydralazine and amlodpine   Acute on CKD Stage 3: Cr continues to trend up. Nephrology following and input is greatly appreciated. - Hx chronic hep c - Pt is s/p renal biopsy on 3/4 - W/u thus far suggesting post-infectious GN - Cr has worsened today with Cr over 6   Tobacco Abuse:transdermal nicotine, was counseled  Chronic hepatitis C: Pt is hep C positive. Discussed with ID.  - No plans  of initiating treatment until after patient's acute issues are resolved  Code Status: Full Family Communication: Pt in room  Disposition Plan: Pending DVT prophylaxis: foot pumps  Consultants:  Nephrology  ID  CIR  Orthopedic Surgery  Procedures:    Antibiotics:  Nafcillin 3/1>>>3/2  Ancef 2/29>>>3/1, resumed 3/2  HPI/Subjective: Pt reports continued R knee pain  Objective: Filed Vitals:   08/19/14 2155 08/20/14 0237 08/20/14 0306 08/20/14 0552  BP: 159/72 140/60 144/62 146/71  Pulse: 103 96 96 95  Temp: 98.1 F (36.7 C) 98.1 F (36.7 C) 98 F (36.7 C) 97.9 F (36.6 C)  TempSrc: Oral Oral Oral Oral  Resp: 19 20 18 15   Height:      Weight:      SpO2: 100% 99% 100% 100%    Intake/Output Summary (Last 24 hours) at 08/20/14 1234 Last data filed at 08/20/14 1149  Gross per 24 hour  Intake   1355 ml  Output    160 ml  Net   1195 ml   Filed Weights   08/07/14 2009  Weight: 90.22 kg (198 lb 14.4 oz)    Exam:   General:  Awake, in nad  Cardiovascular: regular, s1, s2  Respiratory: normal resp effort, no wheezing  Abdomen: soft,nondistended  Musculoskeletal: perfused, B pitting LE edema, post-op dressings over R knee  Data Reviewed: Basic Metabolic Panel:  Recent Labs Lab 08/16/14 0500 08/17/14 0525 08/18/14 0512 08/18/14 2305 08/19/14 1101 08/20/14 1115  NA 127* 128* 128* 131* 131* 130*  K 3.5 3.9 4.0 3.8 3.8 4.8  CL 94* 97 100 100 100 99  CO2 22 17* 15* 18* 16* 20  GLUCOSE 205* 121* 110* 97 89  135*  BUN 46* 54* 62* 44* 49* 55*  CREATININE 4.12* 5.28* 6.54* 5.25* 5.94* 6.92*  CALCIUM 7.8* 7.7* 7.4* 7.6* 7.2* 7.2*  PHOS 5.9*  --   --   --  6.8* 9.0*   Liver Function Tests:  Recent Labs Lab 08/16/14 0500 08/19/14 1101 08/20/14 1115  ALBUMIN 1.1* 1.2* 1.1*   No results for input(s): LIPASE, AMYLASE in the last 168 hours. No results for input(s): AMMONIA in the last 168 hours. CBC:  Recent Labs Lab 08/15/14 1511  08/16/14 0550 08/17/14 0525 08/18/14 0512 08/19/14 0431 08/19/14 2005 08/20/14 0915  WBC 13.2* 12.4* 11.1* 12.5* 12.3*  --  19.0*  NEUTROABS 10.6*  --   --  9.6*  --   --   --   HGB 8.1* 8.0* 8.3* 8.1* 7.4* 6.2* 7.5*  HCT 24.0* 24.1* 24.5* 23.1* 21.6* 18.3* 21.3*  MCV 91.3 89.6 89.1 89.5 89.3  --  88.4  PLT 349 374 427* 508* 533*  --  534*   Cardiac Enzymes:  Recent Labs Lab 08/16/14 1330  CKTOTAL 45   BNP (last 3 results) No results for input(s): BNP in the last 8760 hours.  ProBNP (last 3 results) No results for input(s): PROBNP in the last 8760 hours.  CBG:  Recent Labs Lab 08/19/14 1433 08/19/14 1654 08/19/14 2153 08/20/14 0801 08/20/14 1148  GLUCAP 90 106* 119* 128* 137*    Recent Results (from the past 240 hour(s))  Body fluid culture     Status: None   Collection Time: 08/10/14  5:22 PM  Result Value Ref Range Status   Specimen Description SYNOVIAL JOINT RIGHT KNEE  Final   Special Requests Normal  Final   Gram Stain   Final    ABUNDANT WBC PRESENT,BOTH PMN AND MONONUCLEAR NO ORGANISMS SEEN Performed at [facility]    Culture   Final    FEW STAPHYLOCOCCUS AUREUS Note: RIFAMPIN AND GENTAMICIN SHOULD NOT BE USED AS SINGLE DRUGS FOR TREATMENT OF STAPH INFECTIONS. CRITICAL RESULT CALLED TO, READ BACK BY AND VERIFIED WITH: CINDY FLORES RN 08/13/14 AT 54 AM BY [facility] Performed at [facility]    Report Status 08/14/2014 FINAL  Final   Organism ID, Bacteria STAPHYLOCOCCUS AUREUS  Final      Susceptibility   Staphylococcus aureus - MIC*    CLINDAMYCIN <=0.25 SENSITIVE Sensitive     ERYTHROMYCIN <=0.25 SENSITIVE Sensitive     GENTAMICIN <=0.5 SENSITIVE Sensitive     LEVOFLOXACIN 0.25 SENSITIVE Sensitive     OXACILLIN 0.5 SENSITIVE Sensitive     PENICILLIN >=0.5 RESISTANT Resistant     RIFAMPIN <=0.5 SENSITIVE Sensitive     TRIMETH/SULFA <=10 SENSITIVE Sensitive     VANCOMYCIN <=0.5 SENSITIVE Sensitive     TETRACYCLINE <=1 SENSITIVE  Sensitive     MOXIFLOXACIN <=0.25 SENSITIVE Sensitive     * FEW STAPHYLOCOCCUS AUREUS  Surgical pcr screen     Status: Abnormal   Collection Time: 08/10/14  6:42 PM  Result Value Ref Range Status   MRSA, PCR NEGATIVE NEGATIVE Final   Staphylococcus aureus POSITIVE (A) NEGATIVE Final    Comment:        The Xpert SA Assay (FDA approved for NASAL specimens in patients over 25 years of age), is one component of a comprehensive surveillance program.  Test performance has been validated by [facility] for patients greater than or equal to 66 year old. It is not intended to diagnose infection nor to guide or monitor treatment.  Body fluid culture     Status: None   Collection Time: 08/10/14  9:14 PM  Result Value Ref Range Status   Specimen Description FLUID LEFT AC JOINT  Final   Special Requests VANCO, ROCEPHIN  Final   Gram Stain   Final    FEW WBC PRESENT, PREDOMINANTLY PMN MODERATE GRAM POSITIVE COCCI IN PAIRS IN CLUSTERS Gram Stain Report Called to,Read Back By and Verified With: Gram Stain Report Called to,Read Back By and Verified With: Laurance Flatten RN 08/11/13 9:10AM BY [place] Performed at [facility]    Culture   Final    ABUNDANT STAPHYLOCOCCUS AUREUS Note: RIFAMPIN AND GENTAMICIN SHOULD NOT BE USED AS SINGLE DRUGS FOR TREATMENT OF STAPH INFECTIONS. Performed at [facility]    Report Status 08/13/2014 FINAL  Final   Organism ID, Bacteria STAPHYLOCOCCUS AUREUS  Final      Susceptibility   Staphylococcus aureus - MIC*    CLINDAMYCIN <=0.25 SENSITIVE Sensitive     ERYTHROMYCIN <=0.25 SENSITIVE Sensitive     GENTAMICIN <=0.5 SENSITIVE Sensitive     LEVOFLOXACIN 0.25 SENSITIVE Sensitive     OXACILLIN <=0.25 SENSITIVE Sensitive     PENICILLIN >=0.5 RESISTANT Resistant     RIFAMPIN <=0.5 SENSITIVE Sensitive     TRIMETH/SULFA <=10 SENSITIVE Sensitive     VANCOMYCIN 1 SENSITIVE Sensitive     TETRACYCLINE <=1 SENSITIVE Sensitive     MOXIFLOXACIN <=0.25  SENSITIVE Sensitive     * ABUNDANT STAPHYLOCOCCUS AUREUS  Anaerobic culture     Status: None   Collection Time: 08/10/14  9:14 PM  Result Value Ref Range Status   Specimen Description FLUID LEFT AC JOINT  Final   Special Requests VANCO, ROCEPHIN  Final   Gram Stain   Final    FEW WBC PRESENT, PREDOMINANTLY PMN MODERATE GRAM POSITIVE COCCI IN PAIRS IN CLUSTERS Performed at [facility]    Culture   Final    NO ANAEROBES ISOLATED Performed at [facility]    Report Status 08/15/2014 FINAL  Final  Culture, blood (routine x 2)     Status: None   Collection Time: 08/11/14  7:00 AM  Result Value Ref Range Status   Specimen Description BLOOD RIGHT ARM  Final   Special Requests BOTTLES DRAWN AEROBIC AND ANAEROBIC 6CC  Final   Culture   Final    NO GROWTH 5 DAYS Performed at [facility]    Report Status 08/17/2014 FINAL  Final  Culture, blood (routine x 2)     Status: None   Collection Time: 08/11/14  7:08 AM  Result Value Ref Range Status   Specimen Description BLOOD LEFT HAND  Final   Special Requests BOTTLES DRAWN AEROBIC AND ANAEROBIC [place]  Final   Culture   Final    NO GROWTH 5 DAYS Performed at [facility]    Report Status 08/17/2014 FINAL  Final  Culture, Urine     Status: None   Collection Time: 08/12/14  5:47 AM  Result Value Ref Range Status   Specimen Description URINE, RANDOM  Final   Special Requests NONE  Final   Colony Count   Final    3,000 COLONIES/ML Performed at [facility]    Culture   Final    INSIGNIFICANT GROWTH Performed at [facility]    Report Status 08/13/2014 FINAL  Final  Culture, blood (routine x 2)     Status: None (Preliminary result)   Collection Time: 08/15/14  1:15 PM  Result Value Ref Range Status   Specimen Description BLOOD RIGHT HAND  Final   Special Requests BOTTLES DRAWN AEROBIC ONLY 3CC  Final   Culture   Final           BLOOD CULTURE RECEIVED NO GROWTH TO DATE CULTURE WILL BE  HELD FOR 5 DAYS BEFORE ISSUING A FINAL NEGATIVE REPORT Performed at [facility]    Report Status PENDING  Incomplete  Culture, blood (routine x 2)     Status: None (Preliminary result)   Collection Time: 08/15/14  3:11 PM  Result Value Ref Range Status   Specimen Description BLOOD RIGHT HAND  Final   Special Requests   Final    BOTTLES DRAWN AEROBIC AND ANAEROBIC BLUE 10CC RED 5CC   Culture   Final           BLOOD CULTURE RECEIVED NO GROWTH TO DATE CULTURE WILL BE HELD FOR 5 DAYS BEFORE ISSUING A FINAL NEGATIVE REPORT Performed at [facility]    Report Status PENDING  Incomplete  Culture, Urine     Status: None   Collection Time: 08/17/14  3:14 PM  Result Value Ref Range Status   Specimen Description URINE, CATHETERIZED  Final   Special Requests NONE  Final   Colony Count NO GROWTH Performed at [facility]   Final   Culture NO GROWTH Performed at [facility]   Final   Report Status 08/18/2014 FINAL  Final  Body fluid culture     Status: None (Preliminary result)   Collection Time: 08/18/14  4:52 PM  Result Value Ref Range Status   Specimen Description FLUID SYNOVIAL LEFT KNEE  Final   Special Requests Normal  Final   Gram Stain   Final    MODERATE WBC PRESENT,BOTH PMN AND MONONUCLEAR NO ORGANISMS SEEN Performed at [facility]    Culture   Final    NO GROWTH 1 DAY Performed at [facility]    Report Status PENDING  Incomplete     Studies: US Biopsy  08/18/2014   CLINICAL DATA:  Acute renal failure and need for renal biopsy.  EXAM: ULTRASOUND GUIDED CORE BIOPSY OF RIGHT KIDNEY  MEDICATIONS: 1.0 mg IV Versed; 50 mcg IV Fentanyl  Total Moderate Sedation Time: 13 minutes  PROCEDURE: The procedure, risks, benefits, and alternatives were explained to the patient. Questions regarding the procedure were encouraged and answered. The patient understands and consents to the procedure.  The right posterior flank region was prepped  with Betadine in a sterile fashion, and a sterile drape was applied covering the operative field. A sterile gown and sterile gloves were used for the procedure. Local anesthesia was provided with 1% Lidocaine. A time-out was performed prior to the procedure.  Ultrasound was performed of both kidneys from a posterior approach. The right kidney was chosen for sampling. Under ultrasound guidance, 2 separate 16 gauge core biopsy passes were obtained of lower pole cortex. Core biopsy samples were submitted in saline for nephropathologic analysis. Post biopsy imaging was performed with ultrasound.  COMPLICATIONS: None.  FINDINGS: The right renal anatomy was more favorable for biopsy compared to the left. Intact core biopsy samples were obtained. There were no immediate bleeding complications.  IMPRESSION: Ultrasound-guided core biopsy performed of lower pole cortex of the right kidney.   Electronically Signed   By: Aletta Edouard M.D.   On: 08/18/2014 17:11   Ir Fluoro Guide Cv Line Right  08/18/2014  CLINICAL DATA:  Acute renal failure and need for non tunneled hemodialysis catheter.  EXAM: NON-TUNNELED CENTRAL VENOUS CATHETER PLACEMENT WITH ULTRASOUND AND FLUOROSCOPIC GUIDANCE  FLUOROSCOPY TIME:  6 seconds.  PROCEDURE: The procedure, risks, benefits, and alternatives were explained to the patient. Questions regarding the procedure were encouraged and answered. The patient understands and consents to the procedure.  The right neck and chest were prepped with chlorhexidine in a sterile fashion, and a sterile drape was applied covering the operative field. Maximum barrier sterile technique with sterile gowns and gloves were used for the procedure. Local anesthesia was provided with 1% lidocaine. A time-out was performed prior to the procedure.  After creating a small venotomy incision, a 19 gauge needle was advanced into the right internal jugular vein under direct, real-time ultrasound guidance. Ultrasound image  documentation was performed. After securing guidewire access, the venotomy was dilated. A 13 French, 20 cm length Trialysis catheter was then advanced over the wire. Catheter position was confirmed by a fluoroscopic spot image.  The catheter was aspirated, flushed with saline, and injected with appropriate volume heparin dwells. The catheter exit site was secured with 0-Prolene retention sutures.  COMPLICATIONS: None.  No pneumothorax.  FINDINGS: After catheter placement, the tip lies at the cavoatrial junction. The catheter aspirates normally and is ready for immediate use.  IMPRESSION: Placement of non-tunneled central venous dialysis catheter via the right internal jugular vein. The catheter tip lies at the cavoatrial junction. The catheter is ready for immediate use.   Electronically Signed   By: Aletta Edouard M.D.   On: 08/18/2014 17:02   Ir US Guide Vasc Access Right  08/18/2014   CLINICAL DATA:  Acute renal failure and need for non tunneled hemodialysis catheter.  EXAM: NON-TUNNELED CENTRAL VENOUS CATHETER PLACEMENT WITH ULTRASOUND AND FLUOROSCOPIC GUIDANCE  FLUOROSCOPY TIME:  6 seconds.  PROCEDURE: The procedure, risks, benefits, and alternatives were explained to the patient. Questions regarding the procedure were encouraged and answered. The patient understands and consents to the procedure.  The right neck and chest were prepped with chlorhexidine in a sterile fashion, and a sterile drape was applied covering the operative field. Maximum barrier sterile technique with sterile gowns and gloves were used for the procedure. Local anesthesia was provided with 1% lidocaine. A time-out was performed prior to the procedure.  After creating a small venotomy incision, a 19 gauge needle was advanced into the right internal jugular vein under direct, real-time ultrasound guidance. Ultrasound image documentation was performed. After securing guidewire access, the venotomy was dilated. A 13 French, 20 cm length  Trialysis catheter was then advanced over the wire. Catheter position was confirmed by a fluoroscopic spot image.  The catheter was aspirated, flushed with saline, and injected with appropriate volume heparin dwells. The catheter exit site was secured with 0-Prolene retention sutures.  COMPLICATIONS: None.  No pneumothorax.  FINDINGS: After catheter placement, the tip lies at the cavoatrial junction. The catheter aspirates normally and is ready for immediate use.  IMPRESSION: Placement of non-tunneled central venous dialysis catheter via the right internal jugular vein. The catheter tip lies at the cavoatrial junction. The catheter is ready for immediate use.   Electronically Signed   By: Aletta Edouard M.D.   On: 08/18/2014 17:02   US Abdomen Complete W/elastography  08/18/2014   CLINICAL DATA:  Chronic hepatitis C  EXAM: ULTRASOUND ABDOMEN  ULTRASOUND HEPATIC ELASTOGRAPHY  TECHNIQUE: Sonography of the upper abdomen was performed. In addition, ultrasound elastography evaluation of the liver was  performed. A region of interest was placed within the right lobe of the liver. Following application of a compressive sonographic pulse, shear waves were detected in the adjacent hepatic tissue and the shear wave velocity was calculated. Multiple assessments were performed at the selected site. Median shear wave velocity is correlated to a Metavir fibrosis score.  COMPARISON:  None.  FINDINGS: ULTRASOUND ABDOMEN  Gallbladder: Surgically absent.  Common bile duct: Diameter: 6 mm  Liver: No focal lesion identified. Within normal limits in parenchymal echogenicity.  IVC: No abnormality visualized.  Pancreas: Incompletely visualized but grossly unremarkable.  Spleen: Size and appearance within normal limits.  Right Kidney: Length: 13.5 cm. Echogenic renal parenchyma, suggesting medical renal disease. No mass or hydronephrosis.  Left Kidney: Length: 12.8 cm. Echogenic renal parenchyma, suggesting medical renal disease. No mass  or hydronephrosis.  Abdominal aorta: No aneurysm visualized.  Other findings: None.  ULTRASOUND HEPATIC ELASTOGRAPHY  Device: Siemens Helix VTQ  Transducer 4V1  Patient position: Supine  Number of measurements:  10  Hepatic Segment:  8  Median velocity:   2.95  m/sec  IQR: 0.51  IQR/Median velocity ratio 0.172  Corresponding Metavir fibrosis score:  F3/F4  Risk of fibrosis: High  Limitations of exam: None  Pertinent findings noted on other imaging exams:  None  Please note that abnormal shear wave velocities may also be identified in clinical settings other than with hepatic fibrosis, such as: acute hepatitis, elevated right heart and central venous pressures including use of beta blockers, veno-occlusive disease (Budd-Chiari), infiltrative processes such as mastocytosis/amyloidosis/infiltrative tumor, extrahepatic cholestasis, in the post-prandial state, and liver transplantation. Correlation with patient history, laboratory data, and clinical condition recommended.  IMPRESSION: Echogenic renal parenchyma, suggesting medical renal disease.  Status post cholecystectomy.  Median hepatic shear wave velocity is calculated at 2.95 m/sec.  Corresponding Metavir fibrosis score is F3/F4.  Risk of fibrosis is high.  Follow-up:  Follow-up advised.   Electronically Signed   By: Julian Hy M.D.   On: 08/18/2014 16:49    Scheduled Meds: . amLODipine  10 mg Oral Daily  . antiseptic oral rinse  7 mL Mouth Rinse q12n4p  . aspirin  81 mg Oral Daily  .  ceFAZolin (ANCEF) IV  2 g Intravenous Q12H  . chlorhexidine  15 mL Mouth Rinse BID  . cyclobenzaprine  5 mg Oral BID  . famotidine  20 mg Oral Daily  . ferrous gluconate  324 mg Oral Q breakfast  . hydrALAZINE  25 mg Oral TID  . insulin aspart  0-9 Units Subcutaneous TID WC  . nicotine  21 mg Transdermal Daily  . polyethylene glycol  17 g Oral BID  . sodium chloride  10-40 mL Intracatheter Q12H  . sodium chloride  3 mL Intravenous Q12H   Continuous  Infusions: . sodium chloride 100 mL/hr (08/19/14 1559)    Principal Problem:   Staphylococcus aureus bacteremia Active Problems:   Septic joint of left shoulder region   Sinus tachycardia   Septic joint of right knee joint   CKD (chronic kidney disease) stage 3, GFR 30-59 ml/min   Septic joint of right hand   SIRS (systemic inflammatory response syndrome)   Benign essential HTN   Hepatitis C   Acute renal failure    Brandy Houston, [facility] Pager [phone]. If 7PM-7AM, please contact night-coverage at [email or web address], password [email or web address] 08/20/2014, 12:34 PM  LOS: 13 days

## 2014-08-20 NOTE — Progress Notes (Signed)
May we please have an order for IV Team to access HD pigtrail for fluids and labs?

## 2014-08-21 ENCOUNTER — Encounter (HOSPITAL_COMMUNITY): Payer: Self-pay | Admitting: Orthopaedic Surgery

## 2014-08-21 ENCOUNTER — Inpatient Hospital Stay (HOSPITAL_COMMUNITY): Payer: Medicaid Other

## 2014-08-21 LAB — RENAL FUNCTION PANEL
Albumin: 1.2 g/dL — ABNORMAL LOW (ref 3.5–5.2)
Anion gap: 10 (ref 5–15)
BUN: 62 mg/dL — ABNORMAL HIGH (ref 6–23)
CO2: 16 mmol/L — ABNORMAL LOW (ref 19–32)
Calcium: 7.1 mg/dL — ABNORMAL LOW (ref 8.4–10.5)
Chloride: 102 mmol/L (ref 96–112)
Creatinine, Ser: 7.2 mg/dL — ABNORMAL HIGH (ref 0.50–1.10)
GFR calc Af Amer: 6 mL/min — ABNORMAL LOW (ref >90)
GFR calc non Af Amer: 6 mL/min — ABNORMAL LOW (ref >90)
Glucose, Bld: 117 mg/dL — ABNORMAL HIGH (ref 70–99)
Phosphorus: 9.2 mg/dL — ABNORMAL HIGH (ref 2.3–4.6)
Potassium: 4.7 mmol/L (ref 3.5–5.1)
Sodium: 128 mmol/L — ABNORMAL LOW (ref 135–145)

## 2014-08-21 LAB — CULTURE, BLOOD (ROUTINE X 2)
Culture: NO GROWTH
Culture: NO GROWTH

## 2014-08-21 LAB — IMMUNOFIXATION ELECTROPHORESIS
IgA: 296 mg/dL (ref 69–380)
IgG (Immunoglobin G), Serum: 2300 mg/dL — ABNORMAL HIGH (ref 690–1700)
IgM, Serum: 102 mg/dL (ref 52–322)
Total Protein ELP: 6.3 g/dL (ref 6.0–8.3)

## 2014-08-21 LAB — GLUCOSE, CAPILLARY
Glucose-Capillary: 120 mg/dL — ABNORMAL HIGH (ref 70–99)
Glucose-Capillary: 129 mg/dL — ABNORMAL HIGH (ref 70–99)
Glucose-Capillary: 102 mg/dL — ABNORMAL HIGH (ref 70–99)
Glucose-Capillary: 118 mg/dL — ABNORMAL HIGH (ref 70–99)

## 2014-08-21 LAB — HEMOGLOBIN AND HEMATOCRIT, BLOOD
HCT: 24 % — ABNORMAL LOW (ref 36.0–46.0)
Hemoglobin: 8.5 g/dL — ABNORMAL LOW (ref 12.0–15.0)

## 2014-08-21 LAB — CBC
HCT: 19.4 % — ABNORMAL LOW (ref 36.0–46.0)
Hemoglobin: 6.7 g/dL — CL (ref 12.0–15.0)
MCH: 31 pg (ref 26.0–34.0)
MCHC: 34.5 g/dL (ref 30.0–36.0)
MCV: 89.8 fL (ref 78.0–100.0)
Platelets: 493 10*3/uL — ABNORMAL HIGH (ref 150–400)
RBC: 2.16 MIL/uL — ABNORMAL LOW (ref 3.87–5.11)
RDW: 14.9 % (ref 11.5–15.5)
WBC: 13.7 10*3/uL — ABNORMAL HIGH (ref 4.0–10.5)

## 2014-08-21 LAB — PREPARE RBC (CROSSMATCH)

## 2014-08-21 MED ORDER — DARBEPOETIN ALFA 100 MCG/0.5ML IJ SOSY
PREFILLED_SYRINGE | INTRAMUSCULAR | Status: AC
  Filled 2014-08-21: qty 0.5

## 2014-08-21 MED ORDER — HYDROCODONE-ACETAMINOPHEN 5-325 MG PO TABS
ORAL_TABLET | ORAL | Status: AC
  Filled 2014-08-21: qty 2

## 2014-08-21 MED ORDER — DARBEPOETIN ALFA 100 MCG/0.5ML IJ SOSY
100.0000 ug | PREFILLED_SYRINGE | Freq: Once | INTRAMUSCULAR | Status: AC
  Administered 2014-08-21: 100 ug via INTRAVENOUS
  Filled 2014-08-21: qty 0.5

## 2014-08-21 MED ORDER — SODIUM CHLORIDE 0.9 % IV SOLN
Freq: Once | INTRAVENOUS | Status: AC
  Administered 2014-08-21: 11:00:00 via INTRAVENOUS

## 2014-08-21 NOTE — Progress Notes (Signed)
Dressing c/d/i C/o left knee soreness and heaviness Afebrile WBC continues to be elevated HVAC removed Up with PT when able  N. Eduard Roux, MD [place] 7:49 AM

## 2014-08-21 NOTE — Progress Notes (Signed)
[name], female   DOB: 09/23/1955, 59 y.o.   MRN: [id number]  [facility] Progress Note    Assessment/ Plan:   1. 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 Awaiting results of renal biopsy -this will likely be resulted tomorrow. Currently, without any evidence of renal recovery and we'll plan for dialysis intermittently. She has a temporary dialysis catheter-right IJ currently in place that we will convert to a tunneled catheter if dialysis needs extend beyond this week. 2. MSSA polyarticular septic arthritis - without underlying source of infection (negative TEE).  3. ABLA on anemia of chronic disease- s/p transfusion and cont to transfuse prn.  4. Gross hematuria- following renal biopsy. Continue to follow h/h and hopefully urine will start to clear if not she may need a 3 way foley to flush clots. 5. Hep C + with >8 million copies by PCR, ID following, cryoglobulins pending 6. HTN- stable 7. Hyponatremia- related to AKI and edema.  Anticipated to improve with dialysis 8. DM- historically poorly controlled. Per primary svc   Subjective:   Reports to be feeling fair- bothered by nausea and vomiting as well as abdominal discomfort overnight   Objective:   BP 140/71 mmHg  Pulse 95  Temp(Src) 98.5 F (36.9 C) (Oral)  Resp 20  Ht 5' 7.5" (1.715 m)  Wt 90.22 kg (198 lb 14.4 oz)  BMI 30.67 kg/m2  SpO2 97%  LMP 03/16/2002  Intake/Output Summary (Last 24 hours) at 08/21/14 1132 Last data filed at 08/21/14 0900  Gross per 24 hour  Intake 2376.67 ml  Output      0 ml  Net 2376.67 ml   Weight change:   Physical Exam: Gen: resting comfortably at bed-daughter [name]) at bedside CVS: pulse regular in rate and rhythm, S1 and S2  normal Resp: decreased breath sounds over bases -intermittent left crackles Abd: soft, obese, nontender Ext: 3-4+ lower extremity edema  Imaging: Dg Abd Portable 1v  08/21/2014   CLINICAL DATA:  Constipation, vomiting since yesterday, initial encounter.  EXAM: PORTABLE ABDOMEN - 1 VIEW  COMPARISON:  None.  FINDINGS: Gas is seen in nondilated small bowel and colon. Scattered stool in the colon. Surgical clips in the right upper quadrant and anatomic pelvis.  IMPRESSION: Normal bowel gas pattern.  No overt evidence of constipation.   Electronically Signed   By: Lorin Picket M.D.   On: 08/21/2014 09:16    Labs: BMET  Recent Labs Lab 08/16/14 0500 08/17/14 0525 08/18/14 0512 08/18/14 2305 08/19/14 1101 08/20/14 1115 08/21/14 0721  NA 127* 128* 128* 131* 131* 130* 128*  K 3.5 3.9 4.0 3.8 3.8 4.8 4.7  CL 94* 97 100 100 100 99 102  CO2 22 17* 15* 18* 16* 20 16*  GLUCOSE 205* 121* 110* 97 89 135* 117*  BUN 46* 54* 62* 44* 49* 55* 62*  CREATININE 4.12* 5.28* 6.54* 5.25* 5.94* 6.92* 7.20*  CALCIUM 7.8* 7.7* 7.4* 7.6* 7.2* 7.2* 7.1*  PHOS 5.9*  --   --   --  6.8* 9.0* 9.2*   CBC  Recent Labs Lab 08/15/14 1511  08/18/14 0512 08/19/14 0431 08/19/14 2005 08/20/14 0915 08/21/14 0721  WBC 13.2*  < > 12.5* 12.3*  --  19.0* 13.7*  NEUTROABS 10.6*  --  9.6*  --   --   --   --  HGB 8.1*  < > 8.1* 7.4* 6.2* 7.5* 6.7*  HCT 24.0*  < > 23.1* 21.6* 18.3* 21.3* 19.4*  MCV 91.3  < > 89.5 89.3  --  88.4 89.8  PLT 349  < > 508* 533*  --  534* 493*  < > = values in this interval not displayed.  Medications:    . amLODipine  10 mg Oral Daily  . antiseptic oral rinse  7 mL Mouth Rinse q12n4p  . aspirin  81 mg Oral Daily  .  ceFAZolin (ANCEF) IV  2 g Intravenous Q12H  . chlorhexidine  15 mL Mouth Rinse BID  . cyclobenzaprine  5 mg Oral BID  . famotidine  20 mg Oral Daily  . ferrous gluconate  324 mg Oral Q breakfast  . hydrALAZINE  25 mg Oral TID  . insulin aspart  0-9 Units  Subcutaneous TID WC  . nicotine  21 mg Transdermal Daily  . polyethylene glycol  17 g Oral BID  . sodium chloride  10-40 mL Intracatheter Q12H  . sodium chloride  3 mL Intravenous Q12H   Elmarie Shiley, MD 08/21/2014, 11:32 AM

## 2014-08-21 NOTE — Progress Notes (Signed)
[facility] PROGRESS NOTE  Brandy Houston [id number] DOB: 11-Oct-1955 DOA: 08/07/2014 PCP: Chari Manning, NP  Assessment/Plan: MSSA bacteremia with sepsis and Polyarticular septic arthrits:Initally admitted with acute left shoulder pain, further work up with a MRI of left shoulder demonstrated a septic AC joint.Subsequent blood cultures positive for MSSA. ID and Orthopedics were consulted. Blood cultures on 2/24 was also positive for MSSA. Patient meanwhile started having pain/swelling of her B/L knee joints, Right should joint as well. Further workup demonstarted septic arthritis of these joints.Patient subsequently underwent irrigation and debridement of Right knee, left knee, Right AC & left AC joints, and Irrigation and debridement of right hand flexor tendon sheath. Underwent TEE on 2/29 that was negative. Infectious disease recently recommended switching from Ancef to nafcillin. With worsening renal function, ID has changed abx back to ancef. - ID recommended joint aspiration of R knee given increased swelling and intermittent fevers with R knee aspirated on 3/4 - fluid with 33k WBC's with no growth thus far - R knee underwent arthroscopic irrigation and debridement on 3/5 - Leukocytosis improved this AM to 13.7k from 19k yesterday    Anemia:secondary to acute illness/CKD/post-op bleeding  -Bleeding noted at post-op site from R knee. -Hgb down to 6.7 from 7.5. Two units of PRBC's ordered for during HD   DM-2:CBG's controlled with SSI. Had stopped Glipizide with plans to resume on discharge. A1c 8.4.   HTN: Overall remains stable with hydralazine and amlodpine   Acute on CKD Stage 3: Cr continues to trend up. Nephrology following and input is greatly appreciated. - Hx chronic hep c - Pt is s/p renal biopsy on 3/4 - W/u thus far suggesting post-infectious GN - Cr has worsened today with Cr over 7 - Pt is receiving HD, second session today   Tobacco Abuse:transdermal  nicotine, was counseled  Chronic hepatitis C: Pt is hep C positive. Discussed with ID.  - No plans of initiating treatment until after patient's acute issues are resolved  Code Status: Full Family Communication: Pt in room  Disposition Plan: Pending DVT prophylaxis: foot pumps  Consultants:  Nephrology  ID  CIR  Orthopedic Surgery  Procedures:    Antibiotics:  Nafcillin 3/1>>>3/2  Ancef 2/29>>>3/1, resumed 3/2  HPI/Subjective: Continues to note pain in R knee  Objective: Filed Vitals:   08/20/14 2212 08/21/14 0444 08/21/14 1345 08/21/14 1355  BP: 127/68 140/71 129/73 130/70  Pulse:  95 97 103  Temp: 97.9 F (36.6 C) 98.5 F (36.9 C) 98.8 F (37.1 C)   TempSrc: Oral Oral Oral   Resp: 14 20 18    Height:      Weight:   105.4 kg (232 lb 5.8 oz)   SpO2: 95% 97% 98%     Intake/Output Summary (Last 24 hours) at 08/21/14 1449 Last data filed at 08/21/14 1333  Gross per 24 hour  Intake 2076.67 ml  Output      0 ml  Net 2076.67 ml   Filed Weights   08/07/14 2009 08/21/14 1345  Weight: 90.22 kg (198 lb 14.4 oz) 105.4 kg (232 lb 5.8 oz)    Exam:   General:  Awake, in nad  Cardiovascular: regular, s1, s2  Respiratory: normal resp effort, no wheezing  Abdomen: soft,nondistended  Musculoskeletal: perfused, B pitting LE edema, post-op dressings over R knee  Data Reviewed: Basic Metabolic Panel:  Recent Labs Lab 08/16/14 0500  08/18/14 0512 08/18/14 2305 08/19/14 1101 08/20/14 1115 08/21/14 0721  NA 127*  < > 128* 131* 131* 130*  128*  K 3.5  < > 4.0 3.8 3.8 4.8 4.7  CL 94*  < > 100 100 100 99 102  CO2 22  < > 15* 18* 16* 20 16*  GLUCOSE 205*  < > 110* 97 89 135* 117*  BUN 46*  < > 62* 44* 49* 55* 62*  CREATININE 4.12*  < > 6.54* 5.25* 5.94* 6.92* 7.20*  CALCIUM 7.8*  < > 7.4* 7.6* 7.2* 7.2* 7.1*  PHOS 5.9*  --   --   --  6.8* 9.0* 9.2*  < > = values in this interval not displayed. Liver Function Tests:  Recent Labs Lab 08/16/14 0500  08/19/14 1101 08/20/14 1115 08/21/14 0721  ALBUMIN 1.1* 1.2* 1.1* 1.2*   No results for input(s): LIPASE, AMYLASE in the last 168 hours. No results for input(s): AMMONIA in the last 168 hours. CBC:  Recent Labs Lab 08/15/14 1511  08/17/14 0525 08/18/14 0512 08/19/14 0431 08/19/14 2005 08/20/14 0915 08/21/14 0721  WBC 13.2*  < > 11.1* 12.5* 12.3*  --  19.0* 13.7*  NEUTROABS 10.6*  --   --  9.6*  --   --   --   --   HGB 8.1*  < > 8.3* 8.1* 7.4* 6.2* 7.5* 6.7*  HCT 24.0*  < > 24.5* 23.1* 21.6* 18.3* 21.3* 19.4*  MCV 91.3  < > 89.1 89.5 89.3  --  88.4 89.8  PLT 349  < > 427* 508* 533*  --  534* 493*  < > = values in this interval not displayed. Cardiac Enzymes:  Recent Labs Lab 08/16/14 1330  CKTOTAL 45   BNP (last 3 results) No results for input(s): BNP in the last 8760 hours.  ProBNP (last 3 results) No results for input(s): PROBNP in the last 8760 hours.  CBG:  Recent Labs Lab 08/20/14 1148 08/20/14 1747 08/20/14 2211 08/21/14 0800 08/21/14 1158  GLUCAP 137* 125* 137* 120* 129*    Recent Results (from the past 240 hour(s))  Culture, Urine     Status: None   Collection Time: 08/12/14  5:47 AM  Result Value Ref Range Status   Specimen Description URINE, RANDOM  Final   Special Requests NONE  Final   Colony Count   Final    3,000 COLONIES/ML Performed at [facility]    Culture   Final    INSIGNIFICANT GROWTH Performed at [facility]    Report Status 08/13/2014 FINAL  Final  Culture, blood (routine x 2)     Status: None   Collection Time: 08/15/14  1:15 PM  Result Value Ref Range Status   Specimen Description BLOOD RIGHT HAND  Final   Special Requests BOTTLES DRAWN AEROBIC ONLY 3CC  Final   Culture   Final    NO GROWTH 5 DAYS Performed at [facility]    Report Status 08/21/2014 FINAL  Final  Culture, blood (routine x 2)     Status: None   Collection Time: 08/15/14  3:11 PM  Result Value Ref Range Status   Specimen  Description BLOOD RIGHT HAND  Final   Special Requests   Final    BOTTLES DRAWN AEROBIC AND ANAEROBIC BLUE 10CC RED 5CC   Culture   Final    NO GROWTH 5 DAYS Performed at [facility]    Report Status 08/21/2014 FINAL  Final  Culture, Urine     Status: None   Collection Time: 08/17/14  3:14 PM  Result Value Ref Range Status  Specimen Description URINE, CATHETERIZED  Final   Special Requests NONE  Final   Colony Count NO GROWTH Performed at [facility]   Final   Culture NO GROWTH Performed at [facility]   Final   Report Status 08/18/2014 FINAL  Final  Body fluid culture     Status: None (Preliminary result)   Collection Time: 08/18/14  4:52 PM  Result Value Ref Range Status   Specimen Description FLUID SYNOVIAL LEFT KNEE  Final   Special Requests Normal  Final   Gram Stain   Final    MODERATE WBC PRESENT,BOTH PMN AND MONONUCLEAR NO ORGANISMS SEEN Performed at [facility]    Culture   Final    NO GROWTH 2 DAYS Performed at [facility]    Report Status PENDING  Incomplete     Studies: Dg Abd Portable 1v  08/21/2014   CLINICAL DATA:  Constipation, vomiting since yesterday, initial encounter.  EXAM: PORTABLE ABDOMEN - 1 VIEW  COMPARISON:  None.  FINDINGS: Gas is seen in nondilated small bowel and colon. Scattered stool in the colon. Surgical clips in the right upper quadrant and anatomic pelvis.  IMPRESSION: Normal bowel gas pattern.  No overt evidence of constipation.   Electronically Signed   By: Lorin Picket M.D.   On: 08/21/2014 09:16    Scheduled Meds: . amLODipine  10 mg Oral Daily  . antiseptic oral rinse  7 mL Mouth Rinse q12n4p  . aspirin  81 mg Oral Daily  .  ceFAZolin (ANCEF) IV  2 g Intravenous Q12H  . chlorhexidine  15 mL Mouth Rinse BID  . cyclobenzaprine  5 mg Oral BID  . darbepoetin (ARANESP) injection - DIALYSIS  100 mcg Intravenous Once  . famotidine  20 mg Oral Daily  . ferrous gluconate  324 mg Oral Q  breakfast  . hydrALAZINE  25 mg Oral TID  . insulin aspart  0-9 Units Subcutaneous TID WC  . nicotine  21 mg Transdermal Daily  . polyethylene glycol  17 g Oral BID  . sodium chloride  10-40 mL Intracatheter Q12H  . sodium chloride  3 mL Intravenous Q12H   Continuous Infusions: . sodium chloride 50 mL/hr at 08/21/14 1228    Principal Problem:   Staphylococcus aureus bacteremia Active Problems:   Septic joint of left shoulder region   Sinus tachycardia   Septic joint of right knee joint   CKD (chronic kidney disease) stage 3, GFR 30-59 ml/min   Septic joint of right hand   SIRS (systemic inflammatory response syndrome)   Benign essential HTN   Hepatitis C   Acute renal failure    Atianna Haidar, [facility] Pager [phone]. If 7PM-7AM, please contact night-coverage at [email or web address], password [facility] 08/21/2014, 2:49 PM  LOS: 14 days

## 2014-08-21 NOTE — Progress Notes (Signed)
Physical Therapy Treatment Patient Details Name: Brandy Houston MRN: [id number] DOB: 1956-03-17 Today's Date: 08/21/2014    History of Present Illness MSSA bacteremia with Polyarticular septic arthrits  s/p I&D of BUE's AC joint, BLE knee joint, and right hand    PT Comments    Having trouble progressing due to L knee worse than R knee infection with repetitive needs for irrigation.   Follow Up Recommendations  CIR     Equipment Recommendations  None recommended by PT    Recommendations for Other Services Rehab consult     Precautions / Restrictions Precautions Precautions: Fall Restrictions RLE Weight Bearing: Weight bearing as tolerated LLE Weight Bearing: Weight bearing as tolerated Other Position/Activity Restrictions: As no clarification given after asking from WB clarification, assuming WBAT on bil UE due to MD wanting active movement all joints    Mobility  Bed Mobility Overal bed mobility: Needs Assistance Bed Mobility: Supine to Sit;Sit to Supine     Supine to sit: Mod assist Sit to supine: Mod assist;+2 for physical assistance   General bed mobility comments: moving tentatively due to pain  Transfers Overall transfer level: Needs assistance               General transfer comment: limited to EOB due to HD came for her  Ambulation/Gait                 Stairs            Wheelchair Mobility    Modified Rankin (Stroke Patients Only)       Balance Overall balance assessment: Needs assistance Sitting-balance support: No upper extremity supported Sitting balance-Leahy Scale: Good Sitting balance - Comments: scooted to EOB without assist and while sitting EOB worked on bil knee ROM while eating peaches.                            Cognition Arousal/Alertness: Awake/alert Behavior During Therapy: WFL for tasks assessed/performed Overall Cognitive Status: Within Functional Limits for tasks assessed                       Exercises General Exercises - Lower Extremity Ankle Circles/Pumps: AROM;Both;10 reps;Supine Heel Slides: Both;10 reps;Supine;AAROM;AROM Other Exercises Other Exercises: bicep/tricep press/ shoulder ROM x10 reps.--graded assist    General Comments        Pertinent Vitals/Pain Pain Assessment: 0-10 Pain Score: 10-Worst pain ever Pain Location: L knee Pain Descriptors / Indicators: Aching;Burning Pain Intervention(s): Limited activity within patient's tolerance    Home Living                      Prior Function            PT Goals (current goals can now be found in the care plan section) Acute Rehab PT Goals Patient Stated Goal: go home, feel better PT Goal Formulation: With patient Time For Goal Achievement: 08/27/14 Potential to Achieve Goals: Good Progress towards PT goals: Not progressing toward goals - comment (limited by pain)    Frequency  Min 4X/week    PT Plan      Co-evaluation             End of Session   Activity Tolerance: Patient tolerated treatment well;Patient limited by pain Patient left: in bed;Other (comment) (heading off to HD)     Time: [id number] PT Time Calculation (min) (ACUTE ONLY): 20 min  Charges:  $Therapeutic Activity:  8-22 mins                    G Codes:      Taiyo Kozma, [name] 08/21/2014, 3:07 PM 08/21/2014  Donnella Sham, PT [phone]  (pager)

## 2014-08-21 NOTE — Progress Notes (Signed)
Pt vomited some bright green emesis (approx 50 mls) last night per night RN, and was given Zofran.

## 2014-08-21 NOTE — Progress Notes (Signed)
IV team paged RN stating that they will not be able to draw labs through HD port for evening CBC because there are no orders to do so. Lab has be consulted to draw labs and order has been placed for IV team in the morning when order has been added.

## 2014-08-22 ENCOUNTER — Inpatient Hospital Stay (HOSPITAL_COMMUNITY): Payer: Medicaid Other

## 2014-08-22 DIAGNOSIS — N179 Acute kidney failure, unspecified: Secondary | ICD-10-CM | POA: Insufficient documentation

## 2014-08-22 DIAGNOSIS — N178 Other acute kidney failure: Secondary | ICD-10-CM

## 2014-08-22 DIAGNOSIS — B182 Chronic viral hepatitis C: Secondary | ICD-10-CM | POA: Insufficient documentation

## 2014-08-22 LAB — BASIC METABOLIC PANEL
Anion gap: 8 (ref 5–15)
BUN: 39 mg/dL — ABNORMAL HIGH (ref 6–23)
CO2: 21 mmol/L (ref 19–32)
Calcium: 7.2 mg/dL — ABNORMAL LOW (ref 8.4–10.5)
Chloride: 103 mmol/L (ref 96–112)
Creatinine, Ser: 5.27 mg/dL — ABNORMAL HIGH (ref 0.50–1.10)
GFR calc Af Amer: 9 mL/min — ABNORMAL LOW (ref >90)
GFR calc non Af Amer: 8 mL/min — ABNORMAL LOW (ref >90)
Glucose, Bld: 109 mg/dL — ABNORMAL HIGH (ref 70–99)
Potassium: 3.8 mmol/L (ref 3.5–5.1)
Sodium: 132 mmol/L — ABNORMAL LOW (ref 135–145)

## 2014-08-22 LAB — DRUG SCREEN PANEL (SERUM)

## 2014-08-22 LAB — CBC
HCT: 25.3 % — ABNORMAL LOW (ref 36.0–46.0)
Hemoglobin: 8.7 g/dL — ABNORMAL LOW (ref 12.0–15.0)
MCH: 30.2 pg (ref 26.0–34.0)
MCHC: 34.4 g/dL (ref 30.0–36.0)
MCV: 87.8 fL (ref 78.0–100.0)
Platelets: 459 10*3/uL — ABNORMAL HIGH (ref 150–400)
RBC: 2.88 MIL/uL — ABNORMAL LOW (ref 3.87–5.11)
RDW: 15.2 % (ref 11.5–15.5)
WBC: 12.7 10*3/uL — ABNORMAL HIGH (ref 4.0–10.5)

## 2014-08-22 LAB — BODY FLUID CULTURE
Culture: NO GROWTH
Special Requests: NORMAL

## 2014-08-22 LAB — CRYOGLOBULIN

## 2014-08-22 LAB — GLUCOSE, CAPILLARY
Glucose-Capillary: 121 mg/dL — ABNORMAL HIGH (ref 70–99)
Glucose-Capillary: 149 mg/dL — ABNORMAL HIGH (ref 70–99)
Glucose-Capillary: 140 mg/dL — ABNORMAL HIGH (ref 70–99)
Glucose-Capillary: 126 mg/dL — ABNORMAL HIGH (ref 70–99)

## 2014-08-22 MED ORDER — GUAIFENESIN-DM 100-10 MG/5ML PO SYRP
5.0000 mL | ORAL_SOLUTION | ORAL | Status: DC | PRN
  Administered 2014-08-22 – 2014-08-25 (×2): 5 mL via ORAL
  Filled 2014-08-22 (×3): qty 5

## 2014-08-22 MED ORDER — SALINE SPRAY 0.65 % NA SOLN
1.0000 | NASAL | Status: DC | PRN
  Administered 2014-09-05: 1 via NASAL
  Filled 2014-08-22: qty 44

## 2014-08-22 NOTE — Progress Notes (Signed)
Rehab admissions - Noted patient now receiving HD for ARF.  Not tolerating therapies much due to pain.  Not appropriate for inpatient rehab admission at this time.  Call me for questions.  [phone]

## 2014-08-22 NOTE — Progress Notes (Addendum)
RN went into room to assess patient and found that the residual in her foley drainage bag was bloody. There was about 60cc of bloody urine in the bag. NP Baltazar Najjar has been notified. Awaiting any further orders.

## 2014-08-22 NOTE — Clinical Social Work Note (Signed)
CSW continuing to follow for possible DC needs.   Liz Beach MSW, [place], [place], [id number]

## 2014-08-22 NOTE — Progress Notes (Addendum)
[facility] PROGRESS NOTE  Brandy Houston [id number] DOB: 1955/10/27 DOA: 08/07/2014 PCP: Chari Manning, NP Brief summary:  Patient is a 59 year old African-American female with history of hypertension, diabetes, peripheral neuropathy who presented to [place] emergency room on 2/22 with severe left shoulder pain. Further workup demonstrated MSSA bacteremia with septic arthritis of numerous joints. Patient underwent a TEE that was negative. Unfortunately continues to have intermittent fever and leukocytosis. ID continues to follow and is directing antibiotic treatment.  Events from 3/1-3/8: [name] renal function continued to worsen. Nephrology was consulted. [name] renal function continued to decline and the patient underwent renal biopsy and HD cath placement. Biopsy results remain pending. The patient has since undergone intermittent HD sessions. The patient also continued to be followed by ID with continuation of ancef, recs for 6 weeks of tx. Patient also noted to be HCV pos, to be treated ultimately as an outpatient after bacteremia is resolved  Assessment/Plan: MSSA bacteremia with sepsis and Polyarticular septic arthrits:Initally admitted with acute left shoulder pain, further work up with a MRI of left shoulder demonstrated a septic AC joint.Subsequent blood cultures positive for MSSA. ID and Orthopedics were consulted. Blood cultures on 2/24 was also positive for MSSA. Patient meanwhile started having pain/swelling of her B/L knee joints, Right should joint as well. Further workup demonstarted septic arthritis of these joints.Patient subsequently underwent irrigation and debridement of Right knee, left knee, Right AC & left AC joints, and Irrigation and debridement of right hand flexor tendon sheath. Underwent TEE on 2/29 that was negative. Infectious disease recently recommended switching from Ancef to nafcillin. With worsening renal function, ID has changed abx back to  ancef. - ID recommended joint aspiration of R knee given increased swelling and intermittent fevers with R knee aspirated on 3/4 - fluid with 33k WBC's with no growth thus far - R knee underwent arthroscopic irrigation and debridement on 3/5 - Leukocytosis slowly trending down. Currently 12.7 from over 13 yesterday - ID recs for 6wk of ancef using 2/26 as day 1/42 days    Anemia:secondary to acute illness/CKD/post-op bleeding  -Bleeding noted at post-op site from R knee. -Pt was given 2 units PRBCs on HD on 3/7 - Hgb is holding steady at 8.7   DM-2:CBG's controlled with SSI. Had stopped Glipizide. A1c 8.4.   HTN: Overall remains stable with hydralazine and amlodpine   Acute on CKD Stage 3: Cr continues to trend up. Nephrology following and input is greatly appreciated. - Hx chronic hep c - Pt is s/p renal biopsy on 3/4, awaiting results - W/u thus far suggesting post-infectious GN vs contrast induced, vs AIN from abx, vs ATN - Peak Cr of over 7. Cr today of 5.27 - Intermittent HD per Nephrology recs   Tobacco Abuse:transdermal nicotine, was counseled  Chronic hepatitis C: Pt is hep C positive. Discussed with ID.  - No plans of initiating treatment until after patient's bacteremia is resolved  Code Status: Full Family Communication: Pt in room  Disposition Plan: Pending DVT prophylaxis: foot pumps  Consultants:  Nephrology  ID  CIR  Orthopedic Surgery  Antibiotics:  Nafcillin 3/1>>>3/2  Ancef 2/29>>>3/1, resumed 3/2  HPI/Subjective: Reports generalized pain today. No acute events noted overnight  Objective: Filed Vitals:   08/21/14 2126 08/22/14 0612 08/22/14 1049 08/22/14 1311  BP: 149/77 155/75 139/69 135/76  Pulse: 104   99  Temp: 98.6 F (37 C) 98.7 F (37.1 C)  99 F (37.2 C)  TempSrc: Oral Oral  Oral  Resp: 20 19  16   Height:      Weight:      SpO2: 99% 98%  91%    Intake/Output Summary (Last 24 hours) at 08/22/14 1730 Last data filed at  08/22/14 1055  Gross per 24 hour  Intake    335 ml  Output    470 ml  Net   -135 ml   Filed Weights   08/07/14 2009 08/21/14 1345 08/21/14 1716  Weight: 90.22 kg (198 lb 14.4 oz) 105.4 kg (232 lb 5.8 oz) 103.9 kg (229 lb 0.9 oz)    Exam:   General:  Asleep in be, arousable, in nad  Cardiovascular: regular, s1, s2  Respiratory: normal resp effort, no wheezing  Abdomen: soft,nondistended  Musculoskeletal: perfused, B pitting LE edema, post-op dressings over R knee  Data Reviewed: Basic Metabolic Panel:  Recent Labs Lab 08/16/14 0500  08/18/14 2305 08/19/14 1101 08/20/14 1115 08/21/14 0721 08/22/14 0505  NA 127*  < > 131* 131* 130* 128* 132*  K 3.5  < > 3.8 3.8 4.8 4.7 3.8  CL 94*  < > 100 100 99 102 103  CO2 22  < > 18* 16* 20 16* 21  GLUCOSE 205*  < > 97 89 135* 117* 109*  BUN 46*  < > 44* 49* 55* 62* 39*  CREATININE 4.12*  < > 5.25* 5.94* 6.92* 7.20* 5.27*  CALCIUM 7.8*  < > 7.6* 7.2* 7.2* 7.1* 7.2*  PHOS 5.9*  --   --  6.8* 9.0* 9.2*  --   < > = values in this interval not displayed. Liver Function Tests:  Recent Labs Lab 08/16/14 0500 08/19/14 1101 08/20/14 1115 08/21/14 0721  ALBUMIN 1.1* 1.2* 1.1* 1.2*   No results for input(s): LIPASE, AMYLASE in the last 168 hours. No results for input(s): AMMONIA in the last 168 hours. CBC:  Recent Labs Lab 08/18/14 0512 08/19/14 0431 08/19/14 2005 08/20/14 0915 08/21/14 0721 08/21/14 2132 08/22/14 0505  WBC 12.5* 12.3*  --  19.0* 13.7*  --  12.7*  NEUTROABS 9.6*  --   --   --   --   --   --   HGB 8.1* 7.4* 6.2* 7.5* 6.7* 8.5* 8.7*  HCT 23.1* 21.6* 18.3* 21.3* 19.4* 24.0* 25.3*  MCV 89.5 89.3  --  88.4 89.8  --  87.8  PLT 508* 533*  --  534* 493*  --  459*   Cardiac Enzymes:  Recent Labs Lab 08/16/14 1330  CKTOTAL 45   BNP (last 3 results) No results for input(s): BNP in the last 8760 hours.  ProBNP (last 3 results) No results for input(s): PROBNP in the last 8760 hours.  CBG:  Recent  Labs Lab 08/21/14 1756 08/21/14 2210 08/22/14 0802 08/22/14 1153 08/22/14 1652  GLUCAP 102* 118* 121* 149* 140*    Recent Results (from the past 240 hour(s))  Culture, blood (routine x 2)     Status: None   Collection Time: 08/15/14  1:15 PM  Result Value Ref Range Status   Specimen Description BLOOD RIGHT HAND  Final   Special Requests BOTTLES DRAWN AEROBIC ONLY 3CC  Final   Culture   Final    NO GROWTH 5 DAYS Performed at [facility]    Report Status 08/21/2014 FINAL  Final  Culture, blood (routine x 2)     Status: None   Collection Time: 08/15/14  3:11 PM  Result Value Ref Range Status   Specimen Description BLOOD RIGHT HAND  Final   Special Requests   Final    BOTTLES DRAWN AEROBIC AND ANAEROBIC BLUE 10CC RED 5CC   Culture   Final    NO GROWTH 5 DAYS Performed at [facility]    Report Status 08/21/2014 FINAL  Final  Culture, Urine     Status: None   Collection Time: 08/17/14  3:14 PM  Result Value Ref Range Status   Specimen Description URINE, CATHETERIZED  Final   Special Requests NONE  Final   Colony Count NO GROWTH Performed at [facility]   Final   Culture NO GROWTH Performed at [facility]   Final   Report Status 08/18/2014 FINAL  Final  Body fluid culture     Status: None   Collection Time: 08/18/14  4:52 PM  Result Value Ref Range Status   Specimen Description FLUID SYNOVIAL LEFT KNEE  Final   Special Requests Normal  Final   Gram Stain   Final    MODERATE WBC PRESENT,BOTH PMN AND MONONUCLEAR NO ORGANISMS SEEN Performed at [facility]    Culture   Final    NO GROWTH 3 DAYS Performed at [facility]    Report Status 08/22/2014 FINAL  Final     Studies: Dg Chest Port 1 View  08/22/2014   CLINICAL DATA:  Cough, hypertension, diabetes, smoker  EXAM: PORTABLE CHEST - 1 VIEW  COMPARISON:  Portable exam 0556 hr compared to 08/07/2014  FINDINGS: RIGHT jugular central venous catheter tip projecting  over SVC near cavoatrial junction.  Enlargement of cardiac silhouette with pulmonary vascular congestion.  Peribronchial thickening.  Slight chronic accentuation of interstitial markings similar to previous exam.  Minimal subsegmental atelectasis at RIGHT base.  Atelectasis versus consolidation in LEFT lower lobe with loss of LEFT diaphragmatic silhouette.  No gross pleural effusion or pneumothorax.  IMPRESSION: Enlargement of cardiac silhouette with pulmonary vascular congestion.  Chronic interstitial prominence with minimal RIGHT basilar atelectasis and new atelectasis versus consolidation in LEFT lower lobe.   Electronically Signed   By: Lavonia Dana M.D.   On: 08/22/2014 07:45   Dg Abd Portable 1v  08/21/2014   CLINICAL DATA:  Constipation, vomiting since yesterday, initial encounter.  EXAM: PORTABLE ABDOMEN - 1 VIEW  COMPARISON:  None.  FINDINGS: Gas is seen in nondilated small bowel and colon. Scattered stool in the colon. Surgical clips in the right upper quadrant and anatomic pelvis.  IMPRESSION: Normal bowel gas pattern.  No overt evidence of constipation.   Electronically Signed   By: Lorin Picket M.D.   On: 08/21/2014 09:16    Scheduled Meds: . amLODipine  10 mg Oral Daily  . antiseptic oral rinse  7 mL Mouth Rinse q12n4p  . aspirin  81 mg Oral Daily  .  ceFAZolin (ANCEF) IV  2 g Intravenous Q12H  . chlorhexidine  15 mL Mouth Rinse BID  . cyclobenzaprine  5 mg Oral BID  . famotidine  20 mg Oral Daily  . ferrous gluconate  324 mg Oral Q breakfast  . hydrALAZINE  25 mg Oral TID  . insulin aspart  0-9 Units Subcutaneous TID WC  . nicotine  21 mg Transdermal Daily  . polyethylene glycol  17 g Oral BID  . sodium chloride  10-40 mL Intracatheter Q12H  . sodium chloride  3 mL Intravenous Q12H   Continuous Infusions: . sodium chloride 50 mL/hr at 08/22/14 0841    Principal Problem:   Staphylococcus aureus bacteremia Active Problems:  Septic joint of left shoulder region   Sinus  tachycardia   Septic joint of right knee joint   CKD (chronic kidney disease) stage 3, GFR 30-59 ml/min   Septic joint of right hand   SIRS (systemic inflammatory response syndrome)   Benign essential HTN   Hepatitis C   Acute renal failure    Brandy Houston K  [facility] Pager [phone]. If 7PM-7AM, please contact night-coverage at [email or web address], password [facility] 08/22/2014, 5:30 PM  LOS: 15 days

## 2014-08-22 NOTE — Progress Notes (Signed)
Orders placed to irrigate foley. RN wiped up large clots while doing foley care for patient. Patient's bed was also soaked with urine and blood. Foley irrigated at this time. Bath given and patient is resting. Another 60cc of bloody urine is now in foley bag after irrigation. Will continue to monitor

## 2014-08-22 NOTE — Progress Notes (Signed)
ANTIBIOTIC CONSULT NOTE - FOLLOW UP  Pharmacy Consult for Cefazolin Indication: MSSA polyarticular septic arthritis  Allergies  Allergen Reactions  . Compazine [Prochlorperazine] Shortness Of Breath and Swelling    TONGUE SWELLS  . Omnipaque [Iohexol] Hives  . Shellfish-Derived Products Anaphylaxis  . Iodinated Diagnostic Agents Rash    Patient Measurements: Height: 5' 7.5" (171.5 cm) Weight: 229 lb 0.9 oz (103.9 kg) IBW/kg (Calculated) : 62.75  Vital Signs: Temp: 99 F (37.2 C) (03/08 1311) Temp Source: Oral (03/08 1311) BP: 135/76 mmHg (03/08 1311) Pulse Rate: 99 (03/08 1311) Intake/Output from previous day: 03/07 0701 - 03/08 0700 In: 1250 [P.O.:120; I.V.:410; Blood:670; IV Piggyback:50] Out: 2122 [Urine:120] Intake/Output from this shift: Total I/O In: 225 [P.O.:222; I.V.:3] Out: 350 [Urine:350]  Labs:  Recent Labs  08/20/14 0915 08/20/14 1115 08/21/14 0721 08/21/14 2132 08/22/14 0505  WBC 19.0*  --  13.7*  --  12.7*  HGB 7.5*  --  6.7* 8.5* 8.7*  PLT 534*  --  493*  --  459*  CREATININE  --  6.92* 7.20*  --  5.27*   Estimated Creatinine Clearance: 14.4 mL/min (by C-G formula based on Cr of 5.27).  Assessment:  Total antibiotics day # 14.  On Ancef since 3/2.  AKI. Has had 2 hemodialysis sessions to date, 3/4 and 3/7.  Still with low urine output, renal planning intermittent dialysis. Hematuria following renal biopsy done on 3/4. Awaiting biopsy results.  Goal of Therapy:  appropriate Cefazolin dose for renal function and infection  Plan:   Continue Cefazolin 2 grams IV q12hrs.  Will adjust to daily dosing if Crcl <10.  Follow renal function, progress.    Arty Baumgartner, [place] Pager: [phone] 08/22/2014,2:08 PM

## 2014-08-22 NOTE — Progress Notes (Signed)
[facility]    Date of Admission:  08/07/2014   Total days of antibiotics 14        Day 8 cefazolin           ID: Esly Jiminian is a 59 y.o. female with MSSA disseminated infection with bacteremia, polyarticular septic arthritis. Now complicated by nephrotic syndrome, AKI requiring HD  Principal Problem:   Staphylococcus aureus bacteremia Active Problems:   Septic joint of left shoulder region   Sinus tachycardia   Septic joint of right knee joint   CKD (chronic kidney disease) stage 3, GFR 30-59 ml/min   Septic joint of right hand   SIRS (systemic inflammatory response syndrome)   Benign essential HTN   Hepatitis C   Acute renal failure    Subjective: Complaining of left knee pain  Medications:  . amLODipine  10 mg Oral Daily  . antiseptic oral rinse  7 mL Mouth Rinse q12n4p  . aspirin  81 mg Oral Daily  .  ceFAZolin (ANCEF) IV  2 g Intravenous Q12H  . chlorhexidine  15 mL Mouth Rinse BID  . cyclobenzaprine  5 mg Oral BID  . famotidine  20 mg Oral Daily  . ferrous gluconate  324 mg Oral Q breakfast  . hydrALAZINE  25 mg Oral TID  . insulin aspart  0-9 Units Subcutaneous TID WC  . nicotine  21 mg Transdermal Daily  . polyethylene glycol  17 g Oral BID  . sodium chloride  10-40 mL Intracatheter Q12H  . sodium chloride  3 mL Intravenous Q12H    Objective: Vital signs in last 24 hours: Temp:  [97.1 F (36.2 C)-99 F (37.2 C)] 99 F (37.2 C) (03/08 1311) Pulse Rate:  [93-104] 99 (03/08 1311) Resp:  [16-20] 16 (03/08 1311) BP: (128-178)/(66-82) 135/76 mmHg (03/08 1311) SpO2:  [91 %-100 %] 91 % (03/08 1311) Weight:  [229 lb 0.9 oz (103.9 kg)] 229 lb 0.9 oz (103.9 kg) (03/07 1716)  Physical Exam  Constitutional:  oriented to person, place,. appears well-developed and well-nourished. No distress. Appears fatigued HENT: left ij in place Mouth/Throat: Oropharynx is clear and moist. No oropharyngeal exudate.  Cardiovascular: Normal rate,  regular rhythm and normal heart sounds. Exam reveals no gallop and no friction rub.  No murmur heard.  Pulmonary/Chest: Effort normal and breath sounds normal. No respiratory distress.  has no wheezes.  Abdominal: Soft. Bowel sounds are normal.  exhibits no distension. There is no tenderness.  Lymphadenopathy: no cervical adenopathy.  Ext: tender left knee, increased mobility in shoulder bilaterally Skin: Skin is warm and dry. No rash noted. No erythema.     Lab Results  Recent Labs  08/21/14 0721 08/21/14 2132 08/22/14 0505  WBC 13.7*  --  12.7*  HGB 6.7* 8.5* 8.7*  HCT 19.4* 24.0* 25.3*  NA 128*  --  132*  K 4.7  --  3.8  CL 102  --  103  CO2 16*  --  21  BUN 62*  --  39*  CREATININE 7.20*  --  5.27*   Liver Panel  Recent Labs  08/20/14 1115 08/21/14 0721  ALBUMIN 1.1* 1.2*    Microbiology: 2/25 blood cx mssa 2/26 blood cx NGTD 2/25 AC joint fluid MSSA 3/1 blood cx ngtd 3/4 left knee synovial fluid NGTD Studies/Results: Dg Chest Port 1 View  08/22/2014   CLINICAL DATA:  Cough, hypertension, diabetes, smoker  EXAM: PORTABLE CHEST - 1 VIEW  COMPARISON:  Portable exam  0556 hr compared to 08/07/2014  FINDINGS: RIGHT jugular central venous catheter tip projecting over SVC near cavoatrial junction.  Enlargement of cardiac silhouette with pulmonary vascular congestion.  Peribronchial thickening.  Slight chronic accentuation of interstitial markings similar to previous exam.  Minimal subsegmental atelectasis at RIGHT base.  Atelectasis versus consolidation in LEFT lower lobe with loss of LEFT diaphragmatic silhouette.  No gross pleural effusion or pneumothorax.  IMPRESSION: Enlargement of cardiac silhouette with pulmonary vascular congestion.  Chronic interstitial prominence with minimal RIGHT basilar atelectasis and new atelectasis versus consolidation in LEFT lower lobe.   Electronically Signed   By: Lavonia Dana M.D.   On: 08/22/2014 07:45   Dg Abd Portable 1v  08/21/2014    CLINICAL DATA:  Constipation, vomiting since yesterday, initial encounter.  EXAM: PORTABLE ABDOMEN - 1 VIEW  COMPARISON:  None.  FINDINGS: Gas is seen in nondilated small bowel and colon. Scattered stool in the colon. Surgical clips in the right upper quadrant and anatomic pelvis.  IMPRESSION: Normal bowel gas pattern.  No overt evidence of constipation.   Electronically Signed   By: Lorin Picket M.D.   On: 08/21/2014 09:16     Assessment/Plan: Disseminated MSSA infection = would recommend a total of 6 wk of IV antibiotics with cefazolin (renally dosed). Use 2/26 as day 1 of 42 days  Septic arthritis of left knee = no bacteria found in culture but had been on antibiotics prior to arthrocentesis  bilateraly shoulder septic arthritis = underwent I x D which appears to be improving slowly  AKI = likely multifactorial,but did have proteinuria prior to admit. Nephrology following. Path for renal biopsy still pending  HCV = can be a candidate for treatment even with CKD. Will need to follow up on treatment for HCV after finishing treatment for MSSA infection  Lynzee Lindquist, [facility] for Infectious Diseases Cell: [phone] Pager: [phone]  08/22/2014, 4:05 PM

## 2014-08-22 NOTE — Progress Notes (Signed)
RN walked in to see some bloody tissues in patient's basin. Patient states that it isn't a big deal because this always happens at night when it's dry. RN asked patient what could see do to make the situation better- patient states that this has happened for a long time and that it doesn't make a difference now. RN to notify on-call MD/NP.

## 2014-08-22 NOTE — Progress Notes (Signed)
Physical Therapy Treatment Patient Details Name: Brandy Houston MRN: [id number] DOB: 01-08-1956 Today's Date: 08/22/2014    History of Present Illness MSSA bacteremia with Polyarticular septic arthrits  s/p I&D of BUE's AC joint, BLE knee joint, and right hand    PT Comments    Slow to progress due to extreme pain L Knee with limited ROM and progressive weakness due to inability to maintain WB for any length of time.  Follow Up Recommendations  CIR     Equipment Recommendations  None recommended by PT    Recommendations for Other Services Rehab consult     Precautions / Restrictions Precautions Precautions: Fall Restrictions RLE Weight Bearing: Weight bearing as tolerated LLE Weight Bearing: Weight bearing as tolerated    Mobility  Bed Mobility Overal bed mobility: Needs Assistance Bed Mobility: Supine to Sit;Sit to Supine   Sidelying to sit: Min assist       General bed mobility comments: Bridged to EOB with min assist and min truncal assist  Transfers Overall transfer level: Needs assistance Equipment used: Rolling walker (2 wheeled) Transfers: Sit to/from Stand Sit to Stand: Min assist;Mod assist;+2 physical assistance Stand pivot transfers: Mod assist;+2 safety/equipment       General transfer comment: painful transfer in RW from bed to Recliner with moderat assist/guard.  Ambulation/Gait                 Stairs            Wheelchair Mobility    Modified Rankin (Stroke Patients Only)       Balance Overall balance assessment: Needs assistance Sitting-balance support: No upper extremity supported Sitting balance-Leahy Scale: Good       Standing balance-Leahy Scale: Poor                      Cognition Arousal/Alertness: Awake/alert Behavior During Therapy: WFL for tasks assessed/performed Overall Cognitive Status: Within Functional Limits for tasks assessed                      Exercises General Exercises - Lower  Extremity Ankle Circles/Pumps: AROM;Both;10 reps;Supine Heel Slides: Both;10 reps;Supine;AAROM;AROM Other Exercises Other Exercises: bicep/tricep press/ shoulder ROM x10 reps.--graded assist Other Exercises: Emphasis on L knee AAROM    General Comments        Pertinent Vitals/Pain Pain Assessment: Faces Faces Pain Scale: Hurts even more Pain Location: Left knee (even worse with flexion ROM) Pain Descriptors / Indicators: Nagging;Sharp;Sore Pain Intervention(s): Monitored during session;Premedicated before session    Home Living                      Prior Function            PT Goals (current goals can now be found in the care plan section) Acute Rehab PT Goals Patient Stated Goal: go home, feel better PT Goal Formulation: With patient Time For Goal Achievement: 08/27/14 Potential to Achieve Goals: Good Progress towards PT goals: Progressing toward goals    Frequency  Min 4X/week    PT Plan Current plan remains appropriate    Co-evaluation             End of Session   Activity Tolerance: Patient limited by pain;Patient tolerated treatment well Patient left: in chair;with call bell/phone within reach     Time: [id number] PT Time Calculation (min) (ACUTE ONLY): 26 min  Charges:  $Therapeutic Activity: 23-37 mins  G Codes:      Dearius Hoffmann, [name] 08/22/2014, 4:29 PM 08/22/2014  Donnella Sham, [place] [phone]  (pager)

## 2014-08-22 NOTE — Progress Notes (Signed)
[name], female   DOB: 1956-06-14, 59 y.o.   MRN: [id number]  [facility] Progress Note    Assessment/ Plan:   1. 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 Awaiting results of renal biopsy -this will likely be resulted today. Remains oliguric and without any evidence of renal recovery - plan for dialysis intermittently. She has a temporary dialysis catheter-right IJ currently in place that will be converted to a tunneled catheter if dialysis needs extend beyond this week. 2. MSSA polyarticular septic arthritis - without underlying source of infection (negative TEE).  3. ABLA on anemia of chronic disease- s/p transfusion and cont to transfuse prn.  4. Gross hematuria- following renal biopsy. Continue to follow h/h and hopefully urine will start to clear if not she may need a 3 way foley to flush clots. 5. Hep C + with >8 million copies by PCR, ID following, cryoglobulins pending 6. HTN- stable and acceptable at current elevated levels 7. Hyponatremia- related to AKI and edema. Slightly improved with dialysis 8. DM- historically poorly controlled. Per primary svc  Subjective:   Reports to be feeling fair and had some cough this AM with dry cough- denies CP/SOB   Objective:   BP 155/75 mmHg  Pulse 104  Temp(Src) 98.7 F (37.1 C) (Oral)  Resp 19  Ht 5' 7.5" (1.715 m)  Wt 103.9 kg (229 lb 0.9 oz)  BMI 35.33 kg/m2  SpO2 98%  LMP 03/16/2002  Intake/Output Summary (Last 24 hours) at 08/22/14 1041 Last data filed at 08/22/14 0948  Gross per 24 hour  Intake   1302 ml  Output   2322 ml  Net  -1020 ml   Weight change:   Physical Exam: [id number] resting in bed [id number] RR, normal s1 and s2 Resp:CTA bilaterally, no  rales [id number], obese, NT, BS normal Ext:2+-3+ LE edema  Imaging: Dg Chest Port 1 View  08/22/2014   CLINICAL DATA:  Cough, hypertension, diabetes, smoker  EXAM: PORTABLE CHEST - 1 VIEW  COMPARISON:  Portable exam 0556 hr compared to 08/07/2014  FINDINGS: RIGHT jugular central venous catheter tip projecting over SVC near cavoatrial junction.  Enlargement of cardiac silhouette with pulmonary vascular congestion.  Peribronchial thickening.  Slight chronic accentuation of interstitial markings similar to previous exam.  Minimal subsegmental atelectasis at RIGHT base.  Atelectasis versus consolidation in LEFT lower lobe with loss of LEFT diaphragmatic silhouette.  No gross pleural effusion or pneumothorax.  IMPRESSION: Enlargement of cardiac silhouette with pulmonary vascular congestion.  Chronic interstitial prominence with minimal RIGHT basilar atelectasis and new atelectasis versus consolidation in LEFT lower lobe.   Electronically Signed   By: Lavonia Dana M.D.   On: 08/22/2014 07:45   Dg Abd Portable 1v  08/21/2014   CLINICAL DATA:  Constipation, vomiting since yesterday, initial encounter.  EXAM: PORTABLE ABDOMEN - 1 VIEW  COMPARISON:  None.  FINDINGS: Gas is seen in nondilated small bowel and colon. Scattered stool in the colon. Surgical clips in the right upper quadrant and anatomic pelvis.  IMPRESSION: Normal bowel gas pattern.  No overt evidence of constipation.   Electronically Signed   By: Lorin Picket M.D.   On: 08/21/2014 09:16    Labs: BMET  Recent Labs Lab 08/16/14 0500 08/17/14 0525 08/18/14 0512 08/18/14 2305 08/19/14 1101 08/20/14 1115 08/21/14 0721 08/22/14  0505  NA 127* 128* 128* 131* 131* 130* 128* 132*  K 3.5 3.9 4.0 3.8 3.8 4.8 4.7 3.8  CL 94* 97 100 100 100 99 102 103  CO2 22 17* 15* 18* 16* 20 16* 21  GLUCOSE 205* 121* 110* 97 89 135* 117* 109*  BUN 46* 54* 62* 44* 49* 55* 62* 39*  CREATININE 4.12* 5.28* 6.54* 5.25* 5.94* 6.92* 7.20* 5.27*  CALCIUM 7.8* 7.7* 7.4*  7.6* 7.2* 7.2* 7.1* 7.2*  PHOS 5.9*  --   --   --  6.8* 9.0* 9.2*  --    CBC  Recent Labs Lab 08/15/14 1511  08/18/14 0512 08/19/14 0431  08/20/14 0915 08/21/14 0721 08/21/14 2132 08/22/14 0505  WBC 13.2*  < > 12.5* 12.3*  --  19.0* 13.7*  --  12.7*  NEUTROABS 10.6*  --  9.6*  --   --   --   --   --   --   HGB 8.1*  < > 8.1* 7.4*  < > 7.5* 6.7* 8.5* 8.7*  HCT 24.0*  < > 23.1* 21.6*  < > 21.3* 19.4* 24.0* 25.3*  MCV 91.3  < > 89.5 89.3  --  88.4 89.8  --  87.8  PLT 349  < > 508* 533*  --  534* 493*  --  459*  < > = values in this interval not displayed.  Medications:    . amLODipine  10 mg Oral Daily  . antiseptic oral rinse  7 mL Mouth Rinse q12n4p  . aspirin  81 mg Oral Daily  .  ceFAZolin (ANCEF) IV  2 g Intravenous Q12H  . chlorhexidine  15 mL Mouth Rinse BID  . cyclobenzaprine  5 mg Oral BID  . famotidine  20 mg Oral Daily  . ferrous gluconate  324 mg Oral Q breakfast  . hydrALAZINE  25 mg Oral TID  . insulin aspart  0-9 Units Subcutaneous TID WC  . nicotine  21 mg Transdermal Daily  . polyethylene glycol  17 g Oral BID  . sodium chloride  10-40 mL Intracatheter Q12H  . sodium chloride  3 mL Intravenous Q12H   Elmarie Shiley, MD 08/22/2014, 10:41 AM

## 2014-08-23 LAB — TYPE AND SCREEN
ABO/RH(D): A POS
Antibody Screen: NEGATIVE
Unit division: 0
Unit division: 0
Unit division: 0
Unit division: 0

## 2014-08-23 LAB — BASIC METABOLIC PANEL
Anion gap: 11 (ref 5–15)
BUN: 46 mg/dL — ABNORMAL HIGH (ref 6–23)
CO2: 20 mmol/L (ref 19–32)
Calcium: 7.6 mg/dL — ABNORMAL LOW (ref 8.4–10.5)
Chloride: 101 mmol/L (ref 96–112)
Creatinine, Ser: 6.29 mg/dL — ABNORMAL HIGH (ref 0.50–1.10)
GFR calc Af Amer: 8 mL/min — ABNORMAL LOW (ref >90)
GFR calc non Af Amer: 7 mL/min — ABNORMAL LOW (ref >90)
Glucose, Bld: 118 mg/dL — ABNORMAL HIGH (ref 70–99)
Potassium: 4 mmol/L (ref 3.5–5.1)
Sodium: 132 mmol/L — ABNORMAL LOW (ref 135–145)

## 2014-08-23 LAB — GLUCOSE, CAPILLARY
Glucose-Capillary: 125 mg/dL — ABNORMAL HIGH (ref 70–99)
Glucose-Capillary: 113 mg/dL — ABNORMAL HIGH (ref 70–99)
Glucose-Capillary: 118 mg/dL — ABNORMAL HIGH (ref 70–99)

## 2014-08-23 LAB — CBC
HCT: 23.6 % — ABNORMAL LOW (ref 36.0–46.0)
Hemoglobin: 8 g/dL — ABNORMAL LOW (ref 12.0–15.0)
MCH: 30.1 pg (ref 26.0–34.0)
MCHC: 33.9 g/dL (ref 30.0–36.0)
MCV: 88.7 fL (ref 78.0–100.0)
Platelets: 507 10*3/uL — ABNORMAL HIGH (ref 150–400)
RBC: 2.66 MIL/uL — ABNORMAL LOW (ref 3.87–5.11)
RDW: 15.5 % (ref 11.5–15.5)
WBC: 12.2 10*3/uL — ABNORMAL HIGH (ref 4.0–10.5)

## 2014-08-23 MED ORDER — HEPARIN SODIUM (PORCINE) 5000 UNIT/ML IJ SOLN
5000.0000 [IU] | Freq: Three times a day (TID) | INTRAMUSCULAR | Status: DC
  Administered 2014-08-23: 5000 [IU] via SUBCUTANEOUS

## 2014-08-23 MED ORDER — FLUCONAZOLE 150 MG PO TABS
150.0000 mg | ORAL_TABLET | Freq: Once | ORAL | Status: AC
  Administered 2014-08-23: 150 mg via ORAL
  Filled 2014-08-23: qty 1

## 2014-08-23 NOTE — Progress Notes (Signed)
Patient reported that she has some itching and yeast vaginally- NP Baltazar Najjar has been notifed. Awaiting any further orders

## 2014-08-23 NOTE — Progress Notes (Addendum)
[facility] PROGRESS NOTE  Brandy Houston [id number] DOB: 1955/10/01 DOA: 08/07/2014 PCP: Chari Manning, NP Brief summary:  Patient is a 59 year old African-American female with history of hypertension, diabetes, peripheral neuropathy who presented to [place] emergency room on 2/22 with severe left shoulder pain. Further workup demonstrated MSSA bacteremia with septic arthritis of numerous joints. Patient underwent a TEE that was negative. Unfortunately continues to have intermittent fever and leukocytosis. ID continues to follow and is directing antibiotic treatment.  Events from 3/1-3/8: [name] renal function continued to worsen. Nephrology was consulted. [name] renal function continued to decline and the patient underwent renal biopsy and HD cath placement. Biopsy results remain pending. The patient has since undergone intermittent HD sessions. The patient also continued to be followed by ID with continuation of ancef, recs for 6 weeks of tx. Patient also noted to be HCV pos, to be treated ultimately as an outpatient after bacteremia is resolved   Assessment/Plan:   MSSA bacteremia with sepsis and Polyarticular septic arthrits:Initally admitted with acute left shoulder pain, further work up with a MRI of left shoulder demonstrated a septic AC joint.Subsequent blood cultures positive for MSSA. ID and Orthopedics were consulted. Blood cultures on 2/24 was also positive for MSSA. Patient meanwhile started having pain/swelling of her B/L knee joints, Right should joint as well. Further workup demonstarted septic arthritis of these joints.Patient subsequently underwent irrigation and debridement of Right knee, left knee, Right AC & left AC joints, and Irrigation and debridement of right hand flexor tendon sheath. Underwent TEE on 2/29 that was negative. Infectious disease recently recommended switching from Ancef to nafcillin. With worsening renal function, ID has changed abx  back to ancef. - ID recommended joint aspiration of R knee given increased swelling and intermittent fevers with R knee aspirated on 3/4 - fluid with 33k WBC's with no growth thus far - R knee underwent arthroscopic irrigation and debridement on 3/5 - Leukocytosis slowly trending down. Currently 12.7 from over 13 yesterday - ID recs for 6wk of ancef using 2/26 as day 1/42 days. ID and orthopedics following.      Anemia:secondary to acute illness/CKD/post-op bleeding  -Bleeding noted at post-op site from R knee. -Pt was given 2 units PRBCs on HD on 3/7, monitor hemoglobin stable now.     DM-2:CBG's controlled with SSI. Had stopped Glipizide. A1c 8.4.  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 Renal following, renal biopsy results awaited. Currently on temporary dialysis via right IJ dialysis catheter.  Post biopsy hematuria - monitor.    Tobacco Abuse:transdermal nicotine, was counseled    Chronic hepatitis C: Pt is hep C positive. Discussed with ID.  - No plans of initiating treatment until after patient's bacteremia is resolved    Code Status: Full Family Communication: Pt in room  Disposition Plan: Pending DVT prophylaxis: SCD, will add Heparin once Hematuria is better    Consultants:  Nephrology  ID  CIR  Orthopedic Surgery  Anti-infectives    Start     Dose/Rate Route Frequency Ordered Stop   08/19/14 0500  ceFAZolin (ANCEF) IVPB 2 g/50 mL premix     2 g 100 mL/hr over 30 Minutes Intravenous Every 12 hours 08/18/14 1524  08/18/14 1800  ceFAZolin (ANCEF) IVPB 2 g/50 mL premix  Status:  Discontinued     2 g 100 mL/hr over 30 Minutes Intravenous Every 12 hours 08/18/14 1517 08/18/14 1524   08/18/14  1330  ceFAZolin (ANCEF) 2-3 GM-% IVPB SOLR    Comments:  Barley, Jenny   : cabinet override      08/18/14 1330 08/19/14 0144   08/18/14 0600  ceFAZolin (ANCEF) IVPB 1 g/50 mL premix  Status:  Discontinued     1 g 100 mL/hr over 30 Minutes Intravenous Every 12 hours 08/17/14 1822 08/18/14 1517   08/16/14 1800  ceFAZolin (ANCEF) IVPB 2 g/50 mL premix  Status:  Discontinued     2 g 100 mL/hr over 30 Minutes Intravenous Every 12 hours 08/16/14 1355 08/17/14 1822   08/15/14 1200  nafcillin 2 g in dextrose 5 % 50 mL IVPB  Status:  Discontinued     2 g 100 mL/hr over 30 Minutes Intravenous 6 times per day 08/15/14 1028 08/16/14 1253   08/14/14 1400  ceFAZolin (ANCEF) IVPB 2 g/50 mL premix  Status:  Discontinued     2 g 100 mL/hr over 30 Minutes Intravenous 3 times per day 08/14/14 0850 08/15/14 1028   08/12/14 1800  vancomycin (VANCOCIN) 500 mg in sodium chloride 0.9 % 100 mL IVPB  Status:  Discontinued     500 mg 100 mL/hr over 60 Minutes Intravenous Every 12 hours 08/12/14 0705 08/12/14 0721   08/12/14 1800  vancomycin (VANCOCIN) IVPB 750 mg/150 ml premix  Status:  Discontinued     750 mg 150 mL/hr over 60 Minutes Intravenous Every 12 hours 08/12/14 0721 08/12/14 1315   08/10/14 2138  vancomycin (VANCOCIN) powder  Status:  Discontinued       As needed 08/10/14 2138 08/10/14 2153   08/10/14 1600  cefTRIAXone (ROCEPHIN) 2 g in dextrose 5 % 50 mL IVPB - Premix  Status:  Discontinued     2 g 100 mL/hr over 30 Minutes Intravenous Every 24 hours 08/09/14 1555 08/10/14 1432   08/10/14 1600  ceFAZolin (ANCEF) IVPB 2 g/50 mL premix  Status:  Discontinued     2 g 100 mL/hr over 30 Minutes Intravenous 3 times per day 08/10/14 1432 08/14/14 0845   08/10/14 0600  vancomycin (VANCOCIN) IVPB 750 mg/150 ml premix  Status:  Discontinued     750 mg 150 mL/hr over 60 Minutes Intravenous Every 12 hours 08/09/14 2018 08/12/14 0705   08/09/14 1800  vancomycin (VANCOCIN) 2,000 mg in sodium chloride 0.9 % 500 mL  IVPB     2,000 mg 250 mL/hr over 120 Minutes Intravenous  Once 08/09/14 1616 08/09/14 1929   08/09/14 1630  cefTRIAXone (ROCEPHIN) 2 g in dextrose 5 % 50 mL IVPB - Premix     2 g 100 mL/hr over 30 Minutes Intravenous  Once 08/09/14 1553 08/09/14 1653       HPI/Subjective: Reports generalized pain today. No acute events noted overnight  Objective: Filed Vitals:   08/22/14 1049 08/22/14 1311 08/22/14 2102 08/23/14 0502  BP: 139/69 135/76 150/71 131/81  Pulse:  99 101 95  Temp:  99 F (37.2 C) 99.3 F (37.4 C) 98.7 F (37.1 C)  TempSrc:  Oral Oral Oral  Resp:  16 19 18   Height:      Weight:      SpO2:  91% 95% 90%    Intake/Output Summary (Last 24 hours) at 08/23/14 1049 Last data filed at 08/23/14 [id number]  Gross  per 24 hour  Intake 1997.17 ml  Output    380 ml  Net 1617.17 ml   Filed Weights   08/07/14 2009 08/21/14 1345 08/21/14 1716  Weight: 90.22 kg (198 lb 14.4 oz) 105.4 kg (232 lb 5.8 oz) 103.9 kg (229 lb 0.9 oz)    Exam:   General:  Asleep in be, arousable, in nad  Cardiovascular: regular, s1, s2  Respiratory: normal resp effort, no wheezing  Abdomen: soft,nondistended  Musculoskeletal: perfused, B pitting LE edema, post-op dressings over R knee, R knee still tender on palpation  Data Reviewed: Basic Metabolic Panel:  Recent Labs Lab 08/19/14 1101 08/20/14 1115 08/21/14 0721 08/22/14 0505 08/23/14 0736  NA 131* 130* 128* 132* 132*  K 3.8 4.8 4.7 3.8 4.0  CL 100 99 102 103 101  CO2 16* 20 16* 21 20  GLUCOSE 89 135* 117* 109* 118*  BUN 49* 55* 62* 39* 46*  CREATININE 5.94* 6.92* 7.20* 5.27* 6.29*  CALCIUM 7.2* 7.2* 7.1* 7.2* 7.6*  PHOS 6.8* 9.0* 9.2*  --   --    Liver Function Tests:  Recent Labs Lab 08/19/14 1101 08/20/14 1115 08/21/14 0721  ALBUMIN 1.2* 1.1* 1.2*   No results for input(s): LIPASE, AMYLASE in the last 168 hours. No results for input(s): AMMONIA in the last 168 hours. CBC:  Recent Labs Lab 08/18/14 0512  08/19/14 0431  08/20/14 0915 08/21/14 0721 08/21/14 2132 08/22/14 0505 08/23/14 0736  WBC 12.5* 12.3*  --  19.0* 13.7*  --  12.7* 12.2*  NEUTROABS 9.6*  --   --   --   --   --   --   --   HGB 8.1* 7.4*  < > 7.5* 6.7* 8.5* 8.7* 8.0*  HCT 23.1* 21.6*  < > 21.3* 19.4* 24.0* 25.3* 23.6*  MCV 89.5 89.3  --  88.4 89.8  --  87.8 88.7  PLT 508* 533*  --  534* 493*  --  459* 507*  < > = values in this interval not displayed. Cardiac Enzymes:  Recent Labs Lab 08/16/14 1330  CKTOTAL 45   BNP (last 3 results) No results for input(s): BNP in the last 8760 hours.  ProBNP (last 3 results) No results for input(s): PROBNP in the last 8760 hours.  CBG:  Recent Labs Lab 08/22/14 0802 08/22/14 1153 08/22/14 1652 08/22/14 2203 08/23/14 0814  GLUCAP 121* 149* 140* 126* 125*    Recent Results (from the past 240 hour(s))  Culture, blood (routine x 2)     Status: None   Collection Time: 08/15/14  1:15 PM  Result Value Ref Range Status   Specimen Description BLOOD RIGHT HAND  Final   Special Requests BOTTLES DRAWN AEROBIC ONLY 3CC  Final   Culture   Final    NO GROWTH 5 DAYS Performed at [facility]    Report Status 08/21/2014 FINAL  Final  Culture, blood (routine x 2)     Status: None   Collection Time: 08/15/14  3:11 PM  Result Value Ref Range Status   Specimen Description BLOOD RIGHT HAND  Final   Special Requests   Final    BOTTLES DRAWN AEROBIC AND ANAEROBIC BLUE 10CC RED 5CC   Culture   Final    NO GROWTH 5 DAYS Performed at [facility]    Report Status 08/21/2014 FINAL  Final  Culture, Urine     Status: None   Collection Time: 08/17/14  3:14 PM  Result Value Ref Range Status  Specimen Description URINE, CATHETERIZED  Final   Special Requests NONE  Final   Colony Count NO GROWTH Performed at [facility]   Final   Culture NO GROWTH Performed at [facility]   Final   Report Status 08/18/2014 FINAL  Final  Body fluid culture      Status: None   Collection Time: 08/18/14  4:52 PM  Result Value Ref Range Status   Specimen Description FLUID SYNOVIAL LEFT KNEE  Final   Special Requests Normal  Final   Gram Stain   Final    MODERATE WBC PRESENT,BOTH PMN AND MONONUCLEAR NO ORGANISMS SEEN Performed at [facility]    Culture   Final    NO GROWTH 3 DAYS Performed at [facility]    Report Status 08/22/2014 FINAL  Final     Studies: Dg Chest Port 1 View  08/22/2014   CLINICAL DATA:  Cough, hypertension, diabetes, smoker  EXAM: PORTABLE CHEST - 1 VIEW  COMPARISON:  Portable exam 0556 hr compared to 08/07/2014  FINDINGS: RIGHT jugular central venous catheter tip projecting over SVC near cavoatrial junction.  Enlargement of cardiac silhouette with pulmonary vascular congestion.  Peribronchial thickening.  Slight chronic accentuation of interstitial markings similar to previous exam.  Minimal subsegmental atelectasis at RIGHT base.  Atelectasis versus consolidation in LEFT lower lobe with loss of LEFT diaphragmatic silhouette.  No gross pleural effusion or pneumothorax.  IMPRESSION: Enlargement of cardiac silhouette with pulmonary vascular congestion.  Chronic interstitial prominence with minimal RIGHT basilar atelectasis and new atelectasis versus consolidation in LEFT lower lobe.   Electronically Signed   By: Lavonia Dana M.D.   On: 08/22/2014 07:45    Scheduled Meds: . amLODipine  10 mg Oral Daily  . antiseptic oral rinse  7 mL Mouth Rinse q12n4p  . aspirin  81 mg Oral Daily  .  ceFAZolin (ANCEF) IV  2 g Intravenous Q12H  . chlorhexidine  15 mL Mouth Rinse BID  . cyclobenzaprine  5 mg Oral BID  . famotidine  20 mg Oral Daily  . ferrous gluconate  324 mg Oral Q breakfast  . hydrALAZINE  25 mg Oral TID  . insulin aspart  0-9 Units Subcutaneous TID WC  . nicotine  21 mg Transdermal Daily  . polyethylene glycol  17 g Oral BID  . sodium chloride  10-40 mL Intracatheter Q12H  . sodium chloride  3 mL  Intravenous Q12H   Continuous Infusions: . sodium chloride 50 mL/hr at 08/22/14 [id number]    Principal Problem:   Staphylococcus aureus bacteremia Active Problems:   Septic joint of left shoulder region   Sinus tachycardia   Septic joint of right knee joint   CKD (chronic kidney disease) stage 3, GFR 30-59 ml/min   Septic joint of right hand   SIRS (systemic inflammatory response syndrome)   Benign essential HTN   Hepatitis C   Acute renal failure   Acute renal failure syndrome   Hep C w/ coma, chronic   Joint infection    Jaecion Dempster K  [facility] Pager [phone]. If 7PM-7AM, please contact night-coverage at [email or web address], password [facility] 08/23/2014, 10:49 AM  LOS: 16 days

## 2014-08-23 NOTE — Progress Notes (Signed)
[name], female   DOB: 11/27/1955, 59 y.o.   MRN: [id number]  [facility] Progress Note    Assessment/ Plan:   1. AKI/CKD- DDx includes contrast-induced nephropathy, post-infectious GN, AIN due to antibiotics, vanco toxicity (had elevated trough level), ATN in setting of profound nephrotic syndrome and acute illness, cryoglobulinemia, or another GN related to her Hep C.Will continue monitoring daily for renal recovery/dialysis needs-I have ordered for dialysis for today without significant ultrafiltration to allow for continued renal perfusion/urine output/renal recovery. Anticipate renal biopsy preliminary report this afternoon (I called [facility] nephro pathology this morning)  2. MSSA polyarticular septic arthritis - without underlying source of infection (negative TEE).  3. ABLA on anemia of chronic disease- s/p transfusion and cont to transfuse prn.  4. Gross hematuria- following renal biopsy. Hemoglobin trending down but without any transfusion triggers at this time. 5. Hep C + with >8 million copies by PCR, ID following, cryoglobulins pending 6. HTN- stable and acceptable at current elevated levels 7. Hyponatremia- related to AKI and edema. Slightly improved with dialysis 8. DM- historically poorly controlled. Per primary svc  Subjective:   Reports to be feeling fair-denies any chest pain    Objective:   BP 131/81 mmHg  Pulse 95  Temp(Src) 98.7 F (37.1 C) (Oral)  Resp 18  Ht 5' 7.5" (1.715 m)  Wt 103.9 kg (229 lb 0.9 oz)  BMI 35.33 kg/m2  SpO2 90%  LMP 03/16/2002  Intake/Output Summary (Last 24 hours) at 08/23/14 0941 Last data filed at 08/23/14 [id number]  Gross per 24 hour  Intake 2269.17 ml  Output    580 ml  Net 1689.17 ml   Weight change:   Physical Exam: Gen: Comfortably resting in bed, watching television CVS: Pulse regular in rate and rhythm, ESM over apex Resp:Coarse BS bilaterally, no rales [id number], obese, NT Ext:2+ LE  edema  Imaging: Dg Chest Port 1 View  08/22/2014   CLINICAL DATA:  Cough, hypertension, diabetes, smoker  EXAM: PORTABLE CHEST - 1 VIEW  COMPARISON:  Portable exam 0556 hr compared to 08/07/2014  FINDINGS: RIGHT jugular central venous catheter tip projecting over SVC near cavoatrial junction.  Enlargement of cardiac silhouette with pulmonary vascular congestion.  Peribronchial thickening.  Slight chronic accentuation of interstitial markings similar to previous exam.  Minimal subsegmental atelectasis at RIGHT base.  Atelectasis versus consolidation in LEFT lower lobe with loss of LEFT diaphragmatic silhouette.  No gross pleural effusion or pneumothorax.  IMPRESSION: Enlargement of cardiac silhouette with pulmonary vascular congestion.  Chronic interstitial prominence with minimal RIGHT basilar atelectasis and new atelectasis versus consolidation in LEFT lower lobe.   Electronically Signed   By: Lavonia Dana M.D.   On: 08/22/2014 07:45    Labs: BMET  Recent Labs Lab 08/18/14 0512 08/18/14 2305 08/19/14 1101 08/20/14 1115 08/21/14 0721 08/22/14 0505 08/23/14 0736  NA 128* 131* 131* 130* 128* 132* 132*  K 4.0 3.8 3.8 4.8 4.7 3.8 4.0  CL 100 100 100 99 102 103 101  CO2 15* 18* 16* 20 16* 21 20  GLUCOSE 110* 97 89 135* 117* 109* 118*  BUN 62* 44* 49* 55* 62* 39* 46*  CREATININE 6.54* 5.25* 5.94* 6.92* 7.20* 5.27* 6.29*  CALCIUM 7.4* 7.6* 7.2* 7.2* 7.1* 7.2* 7.6*  PHOS  --   --  6.8* 9.0* 9.2*  --   --    CBC  Recent Labs Lab 08/18/14 0512  08/20/14 0915 08/21/14 0721 08/21/14 2132 08/22/14 0505 08/23/14 0736  WBC  12.5*  < > 19.0* 13.7*  --  12.7* 12.2*  NEUTROABS 9.6*  --   --   --   --   --   --   HGB 8.1*  < > 7.5* 6.7* 8.5* 8.7* 8.0*  HCT 23.1*  < > 21.3* 19.4* 24.0* 25.3* 23.6*  MCV 89.5  < > 88.4 89.8  --  87.8 88.7  PLT 508*  < > 534* 493*  --  459* 507*  < > = values in this interval not displayed.  Medications:    . amLODipine  10 mg Oral Daily  . antiseptic oral  rinse  7 mL Mouth Rinse q12n4p  . aspirin  81 mg Oral Daily  .  ceFAZolin (ANCEF) IV  2 g Intravenous Q12H  . chlorhexidine  15 mL Mouth Rinse BID  . cyclobenzaprine  5 mg Oral BID  . famotidine  20 mg Oral Daily  . ferrous gluconate  324 mg Oral Q breakfast  . hydrALAZINE  25 mg Oral TID  . insulin aspart  0-9 Units Subcutaneous TID WC  . nicotine  21 mg Transdermal Daily  . polyethylene glycol  17 g Oral BID  . sodium chloride  10-40 mL Intracatheter Q12H  . sodium chloride  3 mL Intravenous Q12H      Elmarie Shiley, MD 08/23/2014, 9:41 AM

## 2014-08-24 LAB — URINE MICROSCOPIC-ADD ON

## 2014-08-24 LAB — RENAL FUNCTION PANEL
Albumin: 1.2 g/dL — ABNORMAL LOW (ref 3.5–5.2)
Anion gap: 9 (ref 5–15)
BUN: 29 mg/dL — ABNORMAL HIGH (ref 6–23)
CO2: 23 mmol/L (ref 19–32)
Calcium: 7.4 mg/dL — ABNORMAL LOW (ref 8.4–10.5)
Chloride: 100 mmol/L (ref 96–112)
Creatinine, Ser: 4.88 mg/dL — ABNORMAL HIGH (ref 0.50–1.10)
GFR calc Af Amer: 10 mL/min — ABNORMAL LOW (ref >90)
GFR calc non Af Amer: 9 mL/min — ABNORMAL LOW (ref >90)
Glucose, Bld: 150 mg/dL — ABNORMAL HIGH (ref 70–99)
Phosphorus: 5.1 mg/dL — ABNORMAL HIGH (ref 2.3–4.6)
Potassium: 3.6 mmol/L (ref 3.5–5.1)
Sodium: 132 mmol/L — ABNORMAL LOW (ref 135–145)

## 2014-08-24 LAB — GLUCOSE, CAPILLARY
Glucose-Capillary: 107 mg/dL — ABNORMAL HIGH (ref 70–99)
Glucose-Capillary: 144 mg/dL — ABNORMAL HIGH (ref 70–99)
Glucose-Capillary: 151 mg/dL — ABNORMAL HIGH (ref 70–99)

## 2014-08-24 LAB — URINALYSIS, ROUTINE W REFLEX MICROSCOPIC
Glucose, UA: NEGATIVE mg/dL
Ketones, ur: 15 mg/dL — AB
Nitrite: POSITIVE — AB
Protein, ur: >300 mg/dL — AB
Specific Gravity, Urine: 1.033 — ABNORMAL HIGH (ref 1.005–1.030)
Urobilinogen, UA: 1 mg/dL (ref 0.0–1.0)
pH: 7 (ref 5.0–8.0)

## 2014-08-24 NOTE — Progress Notes (Signed)
Physical Therapy Treatment Patient Details Name: Brandy Houston MRN: [id number] DOB: 06-May-1956 Today's Date: 08/24/2014    History of Present Illness MSSA bacteremia with Polyarticular septic arthrits  s/p I&D of BUE's AC joint, BLE knee joint, and right hand    PT Comments    Slowly improving.  Left knee pain remains the limiting factor.  Follow Up Recommendations  CIR     Equipment Recommendations  None recommended by PT    Recommendations for Other Services       Precautions / Restrictions Precautions Precautions: Fall Restrictions RLE Weight Bearing: Weight bearing as tolerated LLE Weight Bearing: Weight bearing as tolerated    Mobility  Bed Mobility Overal bed mobility: Needs Assistance Bed Mobility: Supine to Sit   Sidelying to sit: Min assist       General bed mobility comments: Bridged to EOB with min assist and min truncal assist  Transfers Overall transfer level: Needs assistance Equipment used: Rolling walker (2 wheeled) Transfers: Sit to/from Stand Sit to Stand: Min assist;Mod assist;+2 physical assistance         General transfer comment: assist to come forward more than assist powering up.  Ambulation/Gait Ambulation/Gait assistance: Mod assist;+2 safety/equipment Ambulation Distance (Feet): 5 Feet Assistive device: Rolling walker (2 wheeled) Gait Pattern/deviations: Step-through pattern;Step-to pattern Gait velocity: Decreased   General Gait Details: painful and unccordinated steps that are generally unsteady.   Stairs            Wheelchair Mobility    Modified Rankin (Stroke Patients Only)       Balance Overall balance assessment: Needs assistance Sitting-balance support: No upper extremity supported Sitting balance-Leahy Scale: Good     Standing balance support: Bilateral upper extremity supported Standing balance-Leahy Scale: Poor Standing balance comment: stood 2-3 minutes during pericare making adjustments to stand  up more upright and  improve contrrol of L knee.                    Cognition Arousal/Alertness: Awake/alert Behavior During Therapy: WFL for tasks assessed/performed Overall Cognitive Status: Within Functional Limits for tasks assessed                      Exercises      General Comments General comments (skin integrity, edema, etc.): AAROM to Left knee prior to mobility,.      Pertinent Vitals/Pain Pain Assessment: Faces Faces Pain Scale: Hurts even more Pain Location: L knee Pain Descriptors / Indicators: Burning;Discomfort Pain Intervention(s): Monitored during session    Home Living                      Prior Function            PT Goals (current goals can now be found in the care plan section) Acute Rehab PT Goals Patient Stated Goal: go home, feel better PT Goal Formulation: With patient Time For Goal Achievement: 08/27/14 Potential to Achieve Goals: Good Progress towards PT goals: Progressing toward goals    Frequency  Min 3X/week    PT Plan Current plan remains appropriate;Frequency needs to be updated    Co-evaluation             End of Session Equipment Utilized During Treatment: Gait belt Activity Tolerance: Patient limited by pain;Patient tolerated treatment well Patient left: in chair;with call bell/phone within reach;Other (comment) (lift pad in the chair.)     Time: [id number] PT Time Calculation (min) (ACUTE ONLY): 27 min  Charges:  $Therapeutic Activity: 23-37 mins                    G Codes:      Tedric Leeth, [name] 08/24/2014, 12:44 PM  08/24/2014  Donnella Sham, [place] [phone]  (pager)

## 2014-08-24 NOTE — Progress Notes (Signed)
   Subjective:  Patient reports pain in left knee mainly.  Heaviness of LLE.  Objective:   VITALS:   Filed Vitals:   08/23/14 2010 08/23/14 2045 08/23/14 2253 08/24/14 0549  BP: 159/86 134/83 138/72 155/90  Pulse: 101 110  101  Temp: 97.5 F (36.4 C) 99 F (37.2 C)  98.7 F (37.1 C)  TempSrc: Oral Oral  Oral  Resp: 16 20  20   Height:      Weight: 105 kg (231 lb 7.7 oz)     SpO2: 100% 97%  95%    Dressings c/d/i Weak left quad function 2/2 surgery and pain No signs of recurrence of infection Clinically overall improved   Lab Results  Component Value Date   WBC 12.2* 08/23/2014   HGB 8.0* 08/23/2014   HCT 23.6* 08/23/2014   MCV 88.7 08/23/2014   PLT 507* 08/23/2014     Assessment/Plan:  5 Days Post-Op   - stable from ortho standpoint - will need long term abx to eradicate infection, appreciate ID input   Marianna Payment 08/24/2014, 6:56 AM [phone]

## 2014-08-24 NOTE — Progress Notes (Signed)
[facility] PROGRESS NOTE  Brandy Houston [id number] DOB: 11-22-1955 DOA: 08/07/2014 PCP: Chari Manning, NP    Assumed care of the patient on 08/23/2014 day 16 of her hospital stay    Brief summary:   Patient is a 59 year old African-American female with history of hypertension, diabetes, peripheral neuropathy who presented to [place] emergency room on 2/22 with severe left shoulder pain. Further workup demonstrated MSSA bacteremia with septic arthritis of numerous joints. Patient underwent a TEE that was negative. Unfortunately continues to have intermittent fever and leukocytosis. ID continues to follow and is directing antibiotic treatment.  Events from 3/1-3/8: [name] renal function continued to worsen. Nephrology was consulted. [name] renal function continued to decline and the patient underwent renal biopsy and HD cath placement. Biopsy results remain pending. The patient has since undergone intermittent HD sessions. The patient also continued to be followed by ID with continuation of ancef, recs for 6 weeks of tx. Patient also noted to be HCV pos, to be treated ultimately as an outpatient after bacteremia is resolved     Subjective:  In bed, denies any headache, no chest abdominal pain, still having left shoulder and left knee pain. Overall feels stable, she has some chronic left-sided weakness but unchanged.   Assessment/Plan:   MSSA bacteremia with sepsis and Polyarticular septic arthrits:Initally admitted with acute left shoulder pain, further work up with a MRI of left shoulder demonstrated a septic AC joint.Subsequent blood cultures positive for MSSA. ID and Orthopedics were consulted. Blood cultures on 2/24 was also positive for MSSA. Patient meanwhile started having pain/swelling of her B/L knee joints, Right should joint as well. Further workup demonstarted septic arthritis of these joints.Patient subsequently underwent irrigation and debridement  of Right knee, left knee, Right AC & left AC joints, and Irrigation and debridement of right hand flexor tendon sheath. Underwent TEE on 2/29 that was negative. Infectious disease recently recommended switching from Ancef to nafcillin. With worsening renal function, ID has changed abx back to ancef. - ID recommended joint aspiration of R knee given increased swelling and intermittent fevers with R knee aspirated on 3/4 - fluid with 33k WBC's with no growth thus far - R knee underwent arthroscopic irrigation and debridement on 3/5 - Leukocytosis stable. - ID recs for 6wk of ancef using 2/26 as day 1/42 days. ID and orthopedics following.       Anemia:secondary to acute illness/CKD/post-op bleeding  -Bleeding noted at post-op site from R knee. -Pt was given 2 units PRBCs on HD on 3/7, monitor hemoglobin stable now.    DM-2:CBG's controlled with SSI. Had stopped Glipizide. A1c 8.4.  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 Renal following, renal biopsy results awaited [facility]). Currently on temporary dialysis via right IJ dialysis catheter. Post biopsy hematuria - monitor.    Tobacco Abuse:transdermal nicotine, was counseled    Chronic hepatitis C: Pt is hep C positive. Discussed with ID.  - No plans of initiating treatment until after patient's bacteremia is resolved     Code Status: Full Family Communication: Pt in room  Disposition Plan: Pending DVT prophylaxis: SCD, will add Heparin once Hematuria is better    Consultants:  Nephrology  ID  CIR  Orthopedic Surgery  Anti-infectives    Start     Dose/Rate Route Frequency Ordered Stop   08/23/14 2200  fluconazole (DIFLUCAN) tablet 150 mg     150 mg Oral  Once  08/23/14 2151 08/23/14 2252   08/19/14 0500  ceFAZolin (ANCEF) IVPB 2 g/50 mL premix     2 g 100 mL/hr over 30 Minutes Intravenous Every 12 hours 08/18/14 1524     08/18/14 1800  ceFAZolin (ANCEF) IVPB 2 g/50 mL premix  Status:  Discontinued     2 g 100 mL/hr over 30 Minutes Intravenous Every 12 hours 08/18/14 1517 08/18/14 1524   08/18/14 1330  ceFAZolin (ANCEF) 2-3 GM-% IVPB SOLR    Comments:  Barley, Jenny   : cabinet override      08/18/14 1330 08/19/14 0144   08/18/14 0600  ceFAZolin (ANCEF) IVPB 1 g/50 mL premix  Status:  Discontinued     1 g 100 mL/hr over 30 Minutes Intravenous Every 12 hours 08/17/14 1822 08/18/14 1517   08/16/14 1800  ceFAZolin (ANCEF) IVPB 2 g/50 mL premix  Status:  Discontinued     2 g 100 mL/hr over 30 Minutes Intravenous Every 12 hours 08/16/14 1355 08/17/14 1822   08/15/14 1200  nafcillin 2 g in dextrose 5 % 50 mL IVPB  Status:  Discontinued     2 g 100 mL/hr over 30 Minutes Intravenous 6 times per day 08/15/14 1028 08/16/14 1253   08/14/14 1400  ceFAZolin (ANCEF) IVPB 2 g/50 mL premix  Status:  Discontinued     2 g 100 mL/hr over 30 Minutes Intravenous 3 times per day 08/14/14 0850 08/15/14 1028   08/12/14 1800  vancomycin (VANCOCIN) 500 mg in sodium chloride 0.9 % 100 mL IVPB  Status:  Discontinued     500 mg 100 mL/hr over 60 Minutes Intravenous Every 12 hours 08/12/14 0705 08/12/14 0721   08/12/14 1800  vancomycin (VANCOCIN) IVPB 750 mg/150 ml premix  Status:  Discontinued     750 mg 150 mL/hr over 60 Minutes Intravenous Every 12 hours 08/12/14 0721 08/12/14 1315   08/10/14 2138  vancomycin (VANCOCIN) powder  Status:  Discontinued       As needed 08/10/14 2138 08/10/14 2153   08/10/14 1600  cefTRIAXone (ROCEPHIN) 2 g in dextrose 5 % 50 mL IVPB - Premix  Status:  Discontinued     2 g 100 mL/hr over 30 Minutes Intravenous Every 24 hours 08/09/14 1555 08/10/14 1432   08/10/14 1600  ceFAZolin (ANCEF) IVPB 2 g/50 mL premix  Status:  Discontinued     2  g 100 mL/hr over 30 Minutes Intravenous 3 times per day 08/10/14 1432 08/14/14 0845   08/10/14 0600  vancomycin (VANCOCIN) IVPB 750 mg/150 ml premix  Status:  Discontinued     750 mg 150 mL/hr over 60 Minutes Intravenous Every 12 hours 08/09/14 2018 08/12/14 0705   08/09/14 1800  vancomycin (VANCOCIN) 2,000 mg in sodium chloride 0.9 % 500 mL IVPB     2,000 mg 250 mL/hr over 120 Minutes Intravenous  Once 08/09/14 1616 08/09/14 1929   08/09/14 1630  cefTRIAXone (ROCEPHIN) 2 g in dextrose 5 % 50 mL IVPB - Premix     2 g 100 mL/hr over 30 Minutes Intravenous  Once 08/09/14 1553 08/09/14 1653         Objective: Filed Vitals:   08/23/14 2010 08/23/14 2045 08/23/14 2253 08/24/14 0549  BP: 159/86 134/83 138/72 155/90  Pulse: 101 110  101  Temp: 97.5 F (36.4 C) 99 F (37.2 C)  98.7 F (37.1 C)  TempSrc: Oral Oral  Oral  Resp: 16 20  20   Height:      Weight: 105 kg (231 lb 7.7 oz)     SpO2: 100% 97%  95%    Intake/Output Summary (Last 24 hours) at 08/24/14 0900 Last data filed at 08/24/14 0700  Gross per 24 hour  Intake 2150.83 ml  Output   1160 ml  Net 990.83 ml   Filed Weights   08/21/14 1716 08/23/14 1652 08/23/14 2010  Weight: 103.9 kg (229 lb 0.9 oz) 106.6 kg (235 lb 0.2 oz) 105 kg (231 lb 7.7 oz)    Exam:   General:  Asleep in be, arousable, in nad  Cardiovascular: regular, s1, s2  Respiratory: normal resp effort, no wheezing  Abdomen: soft,nondistended  Musculoskeletal: perfused, B pitting LE edema, post-op dressings over R knee, R knee still tender on palpation  Data Reviewed: Basic Metabolic Panel:  Recent Labs Lab 08/19/14 1101 08/20/14 1115 08/21/14 0721 08/22/14 0505 08/23/14 0736  NA 131* 130* 128* 132* 132*  K 3.8 4.8 4.7 3.8 4.0  CL 100 99 102 103 101  CO2 16* 20 16* 21 20  GLUCOSE 89 135* 117* 109* 118*  BUN 49* 55* 62* 39* 46*  CREATININE 5.94* 6.92* 7.20* 5.27* 6.29*  CALCIUM 7.2* 7.2* 7.1* 7.2* 7.6*  PHOS 6.8* 9.0* 9.2*  --   --     Liver Function Tests:  Recent Labs Lab 08/19/14 1101 08/20/14 1115 08/21/14 0721  ALBUMIN 1.2* 1.1* 1.2*   No results for input(s): LIPASE, AMYLASE in the last 168 hours. No results for input(s): AMMONIA in the last 168 hours. CBC:  Recent Labs Lab 08/18/14 0512 08/19/14 0431  08/20/14 0915 08/21/14 0721 08/21/14 2132 08/22/14 0505 08/23/14 0736  WBC 12.5* 12.3*  --  19.0* 13.7*  --  12.7* 12.2*  NEUTROABS 9.6*  --   --   --   --   --   --   --   HGB 8.1* 7.4*  < > 7.5* 6.7* 8.5* 8.7* 8.0*  HCT 23.1* 21.6*  < > 21.3* 19.4* 24.0* 25.3* 23.6*  MCV 89.5 89.3  --  88.4 89.8  --  87.8 88.7  PLT 508* 533*  --  534* 493*  --  459* 507*  < > = values in this interval not displayed. Cardiac Enzymes: No results for input(s): CKTOTAL, CKMB, CKMBINDEX, TROPONINI in the last 168 hours. BNP (last 3 results) No results for input(s): BNP in the last 8760 hours.  ProBNP (last 3 results) No results for input(s): PROBNP in the last 8760 hours.  CBG:  Recent Labs Lab 08/22/14 2203 08/23/14 0814 08/23/14 1221 08/23/14 2141 08/24/14 0750  GLUCAP 126* 125* 113* 118* 107*    Recent Results (from the past 240 hour(s))  Culture, blood (routine x 2)     Status: None   Collection Time: 08/15/14  1:15 PM  Result Value Ref Range Status   Specimen Description BLOOD RIGHT HAND  Final   Special Requests BOTTLES DRAWN AEROBIC ONLY 3CC  Final   Culture   Final    NO GROWTH 5 DAYS Performed at [facility]    Report Status 08/21/2014 FINAL  Final  Culture, blood (routine x 2)     Status: None   Collection Time: 08/15/14  3:11 PM  Result Value Ref Range Status   Specimen Description BLOOD RIGHT HAND  Final  Special Requests   Final    BOTTLES DRAWN AEROBIC AND ANAEROBIC BLUE 10CC RED 5CC   Culture   Final    NO GROWTH 5 DAYS Performed at [facility]    Report Status 08/21/2014 FINAL  Final  Culture, Urine     Status: None   Collection Time: 08/17/14  3:14 PM   Result Value Ref Range Status   Specimen Description URINE, CATHETERIZED  Final   Special Requests NONE  Final   Colony Count NO GROWTH Performed at [facility]   Final   Culture NO GROWTH Performed at [facility]   Final   Report Status 08/18/2014 FINAL  Final  Body fluid culture     Status: None   Collection Time: 08/18/14  4:52 PM  Result Value Ref Range Status   Specimen Description FLUID SYNOVIAL LEFT KNEE  Final   Special Requests Normal  Final   Gram Stain   Final    MODERATE WBC PRESENT,BOTH PMN AND MONONUCLEAR NO ORGANISMS SEEN Performed at [facility]    Culture   Final    NO GROWTH 3 DAYS Performed at [facility]    Report Status 08/22/2014 FINAL  Final     Studies:  No results found.  Scheduled Meds: . amLODipine  10 mg Oral Daily  . antiseptic oral rinse  7 mL Mouth Rinse q12n4p  . aspirin  81 mg Oral Daily  .  ceFAZolin (ANCEF) IV  2 g Intravenous Q12H  . chlorhexidine  15 mL Mouth Rinse BID  . cyclobenzaprine  5 mg Oral BID  . famotidine  20 mg Oral Daily  . ferrous gluconate  324 mg Oral Q breakfast  . hydrALAZINE  25 mg Oral TID  . insulin aspart  0-9 Units Subcutaneous TID WC  . nicotine  21 mg Transdermal Daily  . polyethylene glycol  17 g Oral BID  . sodium chloride  10-40 mL Intracatheter Q12H  . sodium chloride  3 mL Intravenous Q12H   Continuous Infusions: . sodium chloride 50 mL/hr at 08/24/14 [id number]    Principal Problem:   Staphylococcus aureus bacteremia Active Problems:   Septic joint of left shoulder region   Sinus tachycardia   Septic joint of right knee joint   CKD (chronic kidney disease) stage 3, GFR 30-59 ml/min   Septic joint of right hand   SIRS (systemic inflammatory response syndrome)   Benign essential HTN   Hepatitis C   Acute renal failure   Acute renal failure syndrome   Hep C w/ coma, chronic   Joint infection    Burgundy Matuszak K  [facility] Pager [phone]. If  7PM-7AM, please contact night-coverage at [email or web address], password [facility] 08/24/2014, 9:00 AM  LOS: 17 days      Assumed care of the patient on 08/23/2014 day 16 of her hospital stay

## 2014-08-24 NOTE — Progress Notes (Signed)
[name], female   DOB: March 13, 1956, 59 y.o.   MRN: [id number]  [facility] Progress Note    Assessment/ Plan:   1. 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 The treatment of this is supportive and management of the underlying infection. We'll continue to follow her daily with labs as we monitor for renal recovery. Hemodialysis done yesterday. 2. MSSA polyarticular septic arthritis - without underlying source of infection (negative TEE).  3. ABLA on anemia of chronic disease- s/p transfusion and cont to transfuse prn.  4. Gross hematuria- following renal biopsy. Hemoglobin trending down but without any transfusion triggers at this time. 5. Hep C + with >8 million copies by PCR, ID following, cryoglobulins pending 6. HTN- stable and acceptable at current elevated levels 7. Hyponatremia- related to AKI and edema. Slightly improved with dialysis 8. DM- historically poorly controlled. Per primary svc   Subjective:   Reports to be feeling well-denies any complaints (she is excited that blood draws can be done through her pigtail of the trialysis catheter)    Objective:   BP 149/69 mmHg  Pulse 101  Temp(Src) 98.7 F (37.1 C) (Oral)  Resp 20  Ht 5' 7.5" (1.715 m)  Wt 105 kg (231 lb 7.7 oz)  BMI 35.70 kg/m2  SpO2 95%  LMP 03/16/2002  Intake/Output Summary (Last 24 hours) at 08/24/14 1128 Last data filed at 08/24/14 0943  Gross per 24 hour  Intake 2030.83 ml  Output   1160 ml  Net 870.83 ml   Weight change:   Physical Exam: Gen: Comfortably resting in bed CVS: Pulse regular tachycardia Resp: Clear to auscultation, no rales Abd: Soft, obese, nontender Ext: 2+ lower extremity edema  Imaging: No results found.  Labs: BMET  Recent Labs Lab 08/18/14 0512 08/18/14 2305 08/19/14 1101 08/20/14 1115  08/21/14 0721 08/22/14 0505 08/23/14 0736  NA 128* 131* 131* 130* 128* 132* 132*  K 4.0 3.8 3.8 4.8 4.7 3.8 4.0  CL 100 100 100 99 102 103 101  CO2 15* 18* 16* 20 16* 21 20  GLUCOSE 110* 97 89 135* 117* 109* 118*  BUN 62* 44* 49* 55* 62* 39* 46*  CREATININE 6.54* 5.25* 5.94* 6.92* 7.20* 5.27* 6.29*  CALCIUM 7.4* 7.6* 7.2* 7.2* 7.1* 7.2* 7.6*  PHOS  --   --  6.8* 9.0* 9.2*  --   --    CBC  Recent Labs Lab 08/18/14 0512  08/20/14 0915 08/21/14 0721 08/21/14 2132 08/22/14 0505 08/23/14 0736  WBC 12.5*  < > 19.0* 13.7*  --  12.7* 12.2*  NEUTROABS 9.6*  --   --   --   --   --   --   HGB 8.1*  < > 7.5* 6.7* 8.5* 8.7* 8.0*  HCT 23.1*  < > 21.3* 19.4* 24.0* 25.3* 23.6*  MCV 89.5  < > 88.4 89.8  --  87.8 88.7  PLT 508*  < > 534* 493*  --  459* 507*  < > = values in this interval not displayed.  Medications:    . amLODipine  10 mg Oral Daily  . antiseptic oral rinse  7 mL Mouth Rinse q12n4p  . aspirin  81 mg Oral Daily  .  ceFAZolin (ANCEF) IV  2 g Intravenous Q12H  . chlorhexidine  15 mL Mouth Rinse BID  . cyclobenzaprine  5 mg Oral BID  . famotidine  20 mg Oral Daily  .  ferrous gluconate  324 mg Oral Q breakfast  . hydrALAZINE  25 mg Oral TID  . insulin aspart  0-9 Units Subcutaneous TID WC  . nicotine  21 mg Transdermal Daily  . polyethylene glycol  17 g Oral BID  . sodium chloride  10-40 mL Intracatheter Q12H  . sodium chloride  3 mL Intravenous Q12H   Elmarie Shiley, MD 08/24/2014, 11:28 AM

## 2014-08-24 NOTE — Clinical Social Work Note (Addendum)
CSW continuing to follow patient for DC needs. Per CIR, patient is no longer a candidate for their program. CSW will continue to search for Medicaid SNF placement at discharge. Per RNCM patient will likely transfer to renal floor tomorrow. CSW has updated facilities in the area to determine if any would be able to offer an LOG bed.    Liz Beach MSW, [place], [place], [id number]

## 2014-08-25 ENCOUNTER — Inpatient Hospital Stay (HOSPITAL_COMMUNITY): Payer: Medicaid Other

## 2014-08-25 DIAGNOSIS — D649 Anemia, unspecified: Secondary | ICD-10-CM

## 2014-08-25 DIAGNOSIS — N049 Nephrotic syndrome with unspecified morphologic changes: Secondary | ICD-10-CM

## 2014-08-25 DIAGNOSIS — M00862 Arthritis due to other bacteria, left knee: Secondary | ICD-10-CM

## 2014-08-25 LAB — CBC
HCT: 21.5 % — ABNORMAL LOW (ref 36.0–46.0)
Hemoglobin: 7.2 g/dL — ABNORMAL LOW (ref 12.0–15.0)
MCH: 30.1 pg (ref 26.0–34.0)
MCHC: 33.5 g/dL (ref 30.0–36.0)
MCV: 90 fL (ref 78.0–100.0)
Platelets: 474 10*3/uL — ABNORMAL HIGH (ref 150–400)
RBC: 2.39 MIL/uL — ABNORMAL LOW (ref 3.87–5.11)
RDW: 15.4 % (ref 11.5–15.5)
WBC: 11.2 10*3/uL — ABNORMAL HIGH (ref 4.0–10.5)

## 2014-08-25 LAB — RENAL FUNCTION PANEL
Albumin: 1.3 g/dL — ABNORMAL LOW (ref 3.5–5.2)
Anion gap: 13 (ref 5–15)
BUN: 33 mg/dL — ABNORMAL HIGH (ref 6–23)
CO2: 22 mmol/L (ref 19–32)
Calcium: 7.7 mg/dL — ABNORMAL LOW (ref 8.4–10.5)
Chloride: 98 mmol/L (ref 96–112)
Creatinine, Ser: 5.59 mg/dL — ABNORMAL HIGH (ref 0.50–1.10)
GFR calc Af Amer: 9 mL/min — ABNORMAL LOW (ref >90)
GFR calc non Af Amer: 8 mL/min — ABNORMAL LOW (ref >90)
Glucose, Bld: 130 mg/dL — ABNORMAL HIGH (ref 70–99)
Phosphorus: 5.5 mg/dL — ABNORMAL HIGH (ref 2.3–4.6)
Potassium: 3.6 mmol/L (ref 3.5–5.1)
Sodium: 133 mmol/L — ABNORMAL LOW (ref 135–145)

## 2014-08-25 LAB — ANAEROBIC CULTURE

## 2014-08-25 LAB — GLUCOSE, CAPILLARY
Glucose-Capillary: 125 mg/dL — ABNORMAL HIGH (ref 70–99)
Glucose-Capillary: 118 mg/dL — ABNORMAL HIGH (ref 70–99)
Glucose-Capillary: 182 mg/dL — ABNORMAL HIGH (ref 70–99)
Glucose-Capillary: 135 mg/dL — ABNORMAL HIGH (ref 70–99)

## 2014-08-25 MED ORDER — DARBEPOETIN ALFA 100 MCG/0.5ML IJ SOSY
100.0000 ug | PREFILLED_SYRINGE | Freq: Once | INTRAMUSCULAR | Status: AC
  Administered 2014-08-28: 100 ug via SUBCUTANEOUS
  Filled 2014-08-25 (×2): qty 0.5

## 2014-08-25 MED ORDER — FUROSEMIDE 10 MG/ML IJ SOLN
40.0000 mg | Freq: Two times a day (BID) | INTRAMUSCULAR | Status: AC
  Administered 2014-08-25 (×2): 40 mg via INTRAVENOUS
  Filled 2014-08-25 (×2): qty 4

## 2014-08-25 NOTE — Progress Notes (Signed)
[name], female   DOB: 07-Feb-1956, 59 y.o.   MRN: [id number]  [facility] Progress Note    Assessment/ Plan:   1. 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 The treatment of this is supportive and management of the underlying infection. Slight rise of creatinine noted but without HD needs. Stop IV fluids and give IV lasix 40mg  BID x 2doses 2. MSSA polyarticular septic arthritis - without underlying source of infection (negative TEE).MRI brain done today- found negative for acute lesions  3. ABLA on anemia of chronic disease- monitor Hgb trend for PRBC needs. s/p ESA on Monday- will re-dose. Recheck iron studies (on PO iron) 4. Hep C + with >8 million copies by PCR, ID following, cryoglobulins pending 5. HTN- stable and acceptable at current elevated levels 6. Hyponatremia- related to AKI and edema. Slightly improved with dialysis 7. DM- historically poorly controlled. Per primary svc  Subjective:   Panic attack and some chest tightness this morning.   Objective:   BP 149/76 mmHg  Pulse 102  Temp(Src) 98.3 F (36.8 C) (Oral)  Resp 18  Ht 5' 7.5" (1.715 m)  Wt 105 kg (231 lb 7.7 oz)  BMI 35.70 kg/m2  SpO2 96%  LMP 03/16/2002  Intake/Output Summary (Last 24 hours) at 08/25/14 1049 Last data filed at 08/25/14 0500  Gross per 24 hour  Intake   1325 ml  Output    500 ml  Net    825 ml   Weight change:   Physical Exam: [id number] resting in bed [id number] Regular tachycardia- s1 and s2 normal Resp:Coarse bilaterally- no rales [id number], obese, NT Ext:1-2+ edema  Imaging: Mr Brain Wo Contrast  08/25/2014   CLINICAL DATA:  Left-sided facial droop  EXAM: MRI HEAD WITHOUT CONTRAST  TECHNIQUE: Multiplanar, multiecho pulse sequences of the brain and surrounding structures were obtained without intravenous contrast.  COMPARISON:   None.  FINDINGS: Diffusion imaging does not show any acute or subacute infarction. The brainstem and cerebellum are normal. The cerebral hemispheres show mild chronic small-vessel change of the white matter. No cortical or large vessel territory infarction. No mass lesion, hemorrhage, hydrocephalus or extra-axial collection. No pituitary mass. No inflammatory sinus disease. Major vessels at the base of the brain show flow.  IMPRESSION: No acute or significant finding. Mild chronic small-vessel change of the cerebral hemispheric white matter.   Electronically Signed   By: Nelson Chimes M.D.   On: 08/25/2014 10:20    Labs: BMET  Recent Labs Lab 08/19/14 1101 08/20/14 1115 08/21/14 0721 08/22/14 0505 08/23/14 0736 08/24/14 1240 08/25/14 0550  NA 131* 130* 128* 132* 132* 132* 133*  K 3.8 4.8 4.7 3.8 4.0 3.6 3.6  CL 100 99 102 103 101 100 98  CO2 16* 20 16* 21 20 23 22   GLUCOSE 89 135* 117* 109* 118* 150* 130*  BUN 49* 55* 62* 39* 46* 29* 33*  CREATININE 5.94* 6.92* 7.20* 5.27* 6.29* 4.88* 5.59*  CALCIUM 7.2* 7.2* 7.1* 7.2* 7.6* 7.4* 7.7*  PHOS 6.8* 9.0* 9.2*  --   --  5.1* 5.5*   CBC  Recent Labs Lab 08/21/14 0721 08/21/14 2132 08/22/14 0505 08/23/14 0736 08/25/14 0550  WBC 13.7*  --  12.7* 12.2* 11.2*  HGB 6.7* 8.5* 8.7* 8.0* 7.2*  HCT 19.4* 24.0* 25.3* 23.6* 21.5*  MCV 89.8  --  87.8 88.7 90.0  PLT 493*  --  459* 507* 474*  Medications:    . amLODipine  10 mg Oral Daily  . antiseptic oral rinse  7 mL Mouth Rinse q12n4p  . aspirin  81 mg Oral Daily  .  ceFAZolin (ANCEF) IV  2 g Intravenous Q12H  . chlorhexidine  15 mL Mouth Rinse BID  . cyclobenzaprine  5 mg Oral BID  . famotidine  20 mg Oral Daily  . ferrous gluconate  324 mg Oral Q breakfast  . furosemide  40 mg Intravenous Q12H  . hydrALAZINE  25 mg Oral TID  . insulin aspart  0-9 Units Subcutaneous TID WC  . nicotine  21 mg Transdermal Daily  . polyethylene glycol  17 g Oral BID  . sodium chloride  10-40 mL  Intracatheter Q12H  . sodium chloride  3 mL Intravenous Q12H   Elmarie Shiley, MD 08/25/2014, 10:49 AM

## 2014-08-25 NOTE — Progress Notes (Signed)
ANTIBIOTIC CONSULT NOTE - FOLLOW UP  Pharmacy Consult for Cefazolin Indication: MSSA polyarticular septic arthritis  Allergies  Allergen Reactions  . Compazine [Prochlorperazine] Shortness Of Breath and Swelling    TONGUE SWELLS  . Omnipaque [Iohexol] Hives  . Shellfish-Derived Products Anaphylaxis  . Iodinated Diagnostic Agents Rash    Patient Measurements: Height: 5' 7.5" (171.5 cm) Weight: 231 lb 7.7 oz (105 kg) IBW/kg (Calculated) : 62.75  Vital Signs: Temp: 98.3 F (36.8 C) (03/11 0510) Temp Source: Oral (03/11 0510) BP: 151/79 mmHg (03/11 1052) Pulse Rate: 102 (03/11 1052) Intake/Output from previous day: 03/10 0701 - 03/11 0700 In: [id number] [P.O.:795; I.V.:550; IV Piggyback:100] Out: 500 [Urine:500] Intake/Output from this shift:    Labs:  Recent Labs  08/23/14 0736 08/24/14 1240 08/25/14 0550  WBC 12.2*  --  11.2*  HGB 8.0*  --  7.2*  PLT 507*  --  474*  CREATININE 6.29* 4.88* 5.59*   Estimated Creatinine Clearance: 13.6 mL/min (by C-G formula based on Cr of 5.59).  Assessment: 80 YOF presented 08/17/14, s/p I&D of left should and antibiotic treatment for septic arthritis/mycositis. Total antibiotics day 17. Now on Ancef, since 3/2, for treatment of MSSA polyarticular septic arthritis. Patient found to have AKI after treatment with naficillin and has had 3 hemodialysis sessions to date, 3/4, 3/7 and 3/9.  Renal biopsy revealed glomerulonephritis attributed to polyarticular arthritis.    Renal function today is mostly unchanged. However, there was a slight rise in SCr 4.88 mg/dL 3/10 to 5.59 mg/dL today and estimated CrCl ~12mL/min. Intermittent dialysis is not needed at this time. Likely to see improvement of renal function as underlying infection is treated.  Renally dosing of cefazolin remains appropriate per patient's renal function today.  WBC 11.2 improved, continues to be afeb.    Goal of Therapy:  appropriate Cefazolin dose for renal function and  infection  Plan:  - Continue Cefazolin 2 grams IV q12hrs. - Follow up renal function, clinical course, and cultures   Gloriajean Dell, PharmD Candidate  ----   Hassie Bruce, Pharm. D. Clinical Pharmacy Resident Pager: [phone] Ph: [phone] 08/25/2014 4:59 PM

## 2014-08-25 NOTE — Progress Notes (Signed)
Called report to [place], Therapist, sports. Pt. Made aware of transfer plans. Transferred pt. To [id number]. No further needs noted at this time.

## 2014-08-25 NOTE — Progress Notes (Signed)
[facility] PROGRESS NOTE  Jameesha Traughber [id number] DOB: Feb 06, 1956 DOA: 08/07/2014 PCP: Chari Manning, NP    Assumed care of the patient on 08/23/2014 day 16 of her hospital stay    Brief summary:   Patient is a 59 year old African-American female with history of hypertension, diabetes, peripheral neuropathy who presented to [place] emergency room on 2/22 with severe left shoulder pain. Further workup demonstrated MSSA bacteremia with septic arthritis of numerous joints. Patient underwent a TEE that was negative. Unfortunately continues to have intermittent fever and leukocytosis. ID continues to follow and is directing antibiotic treatment.  Events from 3/1-3/8: [name] renal function continued to worsen. Nephrology was consulted. [name] renal function continued to decline and the patient underwent renal biopsy and HD cath placement. Biopsy results remain pending. The patient has since undergone intermittent HD sessions. The patient also continued to be followed by ID with continuation of ancef, recs for 6 weeks of tx. Patient also noted to be HCV pos, to be treated ultimately as an outpatient after bacteremia is resolved     Subjective:  In bed, denies any headache, no chest abdominal pain, still having left shoulder and left knee pain. Overall feels stable, she has some chronic left-sided weakness but unchanged.   Assessment/Plan:   MSSA bacteremia with sepsis and Polyarticular MSSA septic arthrits: Bilateral knee and shoulder septic arthritis seen by ID and Ortho Dr [name], she underwent irrigation and debridement of Right knee, left knee, Right AC & left AC joints, and Irrigation and debridement of right hand flexor tendon sheath.  Underwent TEE on 2/29 that was negative.She again underwent R knee arthroscopic irrigation and debridement on 3/5. Leukocytosis stable.   ID recs for 6wk of ancef using 2/26 as day 1/42 days. ID and orthopedics following.       Anemia:secondary to acute illness/CKD/post-op bleeding  -Bleeding noted at post-op site from R knee. -Pt was given 2 units PRBCs on HD on 3/7, monitor hemoglobin stable now.    DM-2:CBG's controlled with SSI. Had stopped Glipizide. A1c 8.4.  CBG (last 3)   Recent Labs  08/24/14 1157 08/24/14 1701 08/25/14 0820  GLUCAP 144* 151* 125*        HTN: Overall remains stable with hydralazine and amlodpine    Acute on CKD Stage 3: due to combination of sepsis, contrast nephropathy, postinfectious GN was his AIN from antibiotics. Renal following, renal biopsy results awaited [facility]). Currently on temporary dialysis via right IJ dialysis catheter. Post biopsy hematuria - monitor.    Tobacco Abuse:transdermal nicotine, was counseled    Chronic hepatitis C: Pt is hep C positive. Discussed with ID.  - No plans of initiating treatment until after patient's bacteremia is resolved    Left-sided facial droop. Likely chronic, I will obtain MRI of the brain with no acute changes on 08/25/2014. On 81 mg of aspirin continue.     Code Status: Full Family Communication: Pt in room  Disposition Plan: Pending DVT prophylaxis: SCD, will add Heparin once Hematuria is better    Consultants:  Nephrology  ID  CIR  Orthopedic Surgery  Anti-infectives    Start     Dose/Rate Route Frequency Ordered Stop   08/23/14 2200  fluconazole (DIFLUCAN) tablet 150 mg     150 mg Oral  Once 08/23/14 2151 08/23/14 2252   08/19/14 0500  ceFAZolin (ANCEF) IVPB 2 g/50 mL premix     2 g 100 mL/hr over 30 Minutes Intravenous Every 12 hours 08/18/14 1524  08/18/14 1800  ceFAZolin (ANCEF) IVPB 2 g/50 mL premix  Status:  Discontinued     2 g 100 mL/hr over 30 Minutes Intravenous Every 12 hours 08/18/14 1517 08/18/14 1524   08/18/14 1330  ceFAZolin (ANCEF) 2-3 GM-% IVPB SOLR    Comments:  Barley, Jenny   : cabinet override      08/18/14 1330 08/19/14 0144   08/18/14 0600  ceFAZolin (ANCEF)  IVPB 1 g/50 mL premix  Status:  Discontinued     1 g 100 mL/hr over 30 Minutes Intravenous Every 12 hours 08/17/14 1822 08/18/14 1517   08/16/14 1800  ceFAZolin (ANCEF) IVPB 2 g/50 mL premix  Status:  Discontinued     2 g 100 mL/hr over 30 Minutes Intravenous Every 12 hours 08/16/14 1355 08/17/14 1822   08/15/14 1200  nafcillin 2 g in dextrose 5 % 50 mL IVPB  Status:  Discontinued     2 g 100 mL/hr over 30 Minutes Intravenous 6 times per day 08/15/14 1028 08/16/14 1253   08/14/14 1400  ceFAZolin (ANCEF) IVPB 2 g/50 mL premix  Status:  Discontinued     2 g 100 mL/hr over 30 Minutes Intravenous 3 times per day 08/14/14 0850 08/15/14 1028   08/12/14 1800  vancomycin (VANCOCIN) 500 mg in sodium chloride 0.9 % 100 mL IVPB  Status:  Discontinued     500 mg 100 mL/hr over 60 Minutes Intravenous Every 12 hours 08/12/14 0705 08/12/14 0721   08/12/14 1800  vancomycin (VANCOCIN) IVPB 750 mg/150 ml premix  Status:  Discontinued     750 mg 150 mL/hr over 60 Minutes Intravenous Every 12 hours 08/12/14 0721 08/12/14 1315   08/10/14 2138  vancomycin (VANCOCIN) powder  Status:  Discontinued       As needed 08/10/14 2138 08/10/14 2153   08/10/14 1600  cefTRIAXone (ROCEPHIN) 2 g in dextrose 5 % 50 mL IVPB - Premix  Status:  Discontinued     2 g 100 mL/hr over 30 Minutes Intravenous Every 24 hours 08/09/14 1555 08/10/14 1432   08/10/14 1600  ceFAZolin (ANCEF) IVPB 2 g/50 mL premix  Status:  Discontinued     2 g 100 mL/hr over 30 Minutes Intravenous 3 times per day 08/10/14 1432 08/14/14 0845   08/10/14 0600  vancomycin (VANCOCIN) IVPB 750 mg/150 ml premix  Status:  Discontinued     750 mg 150 mL/hr over 60 Minutes Intravenous Every 12 hours 08/09/14 2018 08/12/14 0705   08/09/14 1800  vancomycin (VANCOCIN) 2,000 mg in sodium chloride 0.9 % 500 mL IVPB     2,000 mg 250 mL/hr over 120 Minutes Intravenous  Once 08/09/14 1616 08/09/14 1929   08/09/14 1630  cefTRIAXone (ROCEPHIN) 2 g in dextrose 5 % 50 mL  IVPB - Premix     2 g 100 mL/hr over 30 Minutes Intravenous  Once 08/09/14 1553 08/09/14 1653         Objective: Filed Vitals:   08/24/14 1329 08/24/14 1608 08/24/14 2126 08/25/14 0510  BP: 133/83 144/72 161/77 149/76  Pulse: 107  120 102  Temp:   99.3 F (37.4 C) 98.3 F (36.8 C)  TempSrc:   Oral Oral  Resp: 16  18 18   Height:      Weight:      SpO2: 97%  92% 96%    Intake/Output Summary (Last 24 hours) at 08/25/14 1027 Last data filed at 08/25/14 0500  Gross per 24 hour  Intake   1325 ml  Output  500 ml  Net    825 ml   Filed Weights   08/21/14 1716 08/23/14 1652 08/23/14 2010  Weight: 103.9 kg (229 lb 0.9 oz) 106.6 kg (235 lb 0.2 oz) 105 kg (231 lb 7.7 oz)    Exam:   General:  Asleep in be, arousable, in nad, chronic left-sided facial droop  Cardiovascular: regular, s1, s2  Respiratory: normal resp effort, no wheezing  Abdomen: soft,nondistended  Musculoskeletal: perfused, B pitting LE edema, post-op dressings over R knee, R knee still tender on palpation  Data Reviewed: Basic Metabolic Panel:  Recent Labs Lab 08/19/14 1101 08/20/14 1115 08/21/14 0721 08/22/14 0505 08/23/14 0736 08/24/14 1240 08/25/14 0550  NA 131* 130* 128* 132* 132* 132* 133*  K 3.8 4.8 4.7 3.8 4.0 3.6 3.6  CL 100 99 102 103 101 100 98  CO2 16* 20 16* 21 20 23 22   GLUCOSE 89 135* 117* 109* 118* 150* 130*  BUN 49* 55* 62* 39* 46* 29* 33*  CREATININE 5.94* 6.92* 7.20* 5.27* 6.29* 4.88* 5.59*  CALCIUM 7.2* 7.2* 7.1* 7.2* 7.6* 7.4* 7.7*  PHOS 6.8* 9.0* 9.2*  --   --  5.1* 5.5*   Liver Function Tests:  Recent Labs Lab 08/19/14 1101 08/20/14 1115 08/21/14 0721 08/24/14 1240 08/25/14 0550  ALBUMIN 1.2* 1.1* 1.2* 1.2* 1.3*   No results for input(s): LIPASE, AMYLASE in the last 168 hours. No results for input(s): AMMONIA in the last 168 hours. CBC:  Recent Labs Lab 08/20/14 0915 08/21/14 0721 08/21/14 2132 08/22/14 0505 08/23/14 0736 08/25/14 0550  WBC 19.0*  13.7*  --  12.7* 12.2* 11.2*  HGB 7.5* 6.7* 8.5* 8.7* 8.0* 7.2*  HCT 21.3* 19.4* 24.0* 25.3* 23.6* 21.5*  MCV 88.4 89.8  --  87.8 88.7 90.0  PLT 534* 493*  --  459* 507* 474*   Cardiac Enzymes: No results for input(s): CKTOTAL, CKMB, CKMBINDEX, TROPONINI in the last 168 hours. BNP (last 3 results) No results for input(s): BNP in the last 8760 hours.  ProBNP (last 3 results) No results for input(s): PROBNP in the last 8760 hours.  CBG:  Recent Labs Lab 08/23/14 2141 08/24/14 0750 08/24/14 1157 08/24/14 1701 08/25/14 0820  GLUCAP 118* 107* 144* 151* 125*    Recent Results (from the past 240 hour(s))  Culture, blood (routine x 2)     Status: None   Collection Time: 08/15/14  1:15 PM  Result Value Ref Range Status   Specimen Description BLOOD RIGHT HAND  Final   Special Requests BOTTLES DRAWN AEROBIC ONLY 3CC  Final   Culture   Final    NO GROWTH 5 DAYS Performed at [facility]    Report Status 08/21/2014 FINAL  Final  Culture, blood (routine x 2)     Status: None   Collection Time: 08/15/14  3:11 PM  Result Value Ref Range Status   Specimen Description BLOOD RIGHT HAND  Final   Special Requests   Final    BOTTLES DRAWN AEROBIC AND ANAEROBIC BLUE 10CC RED 5CC   Culture   Final    NO GROWTH 5 DAYS Performed at [facility]    Report Status 08/21/2014 FINAL  Final  Culture, Urine     Status: None   Collection Time: 08/17/14  3:14 PM  Result Value Ref Range Status   Specimen Description URINE, CATHETERIZED  Final   Special Requests NONE  Final   Colony Count NO GROWTH Performed at [facility]   Final  Culture NO GROWTH Performed at [facility]   Final   Report Status 08/18/2014 FINAL  Final  Body fluid culture     Status: None   Collection Time: 08/18/14  4:52 PM  Result Value Ref Range Status   Specimen Description FLUID SYNOVIAL LEFT KNEE  Final   Special Requests Normal  Final   Gram Stain   Final    MODERATE WBC  PRESENT,BOTH PMN AND MONONUCLEAR NO ORGANISMS SEEN Performed at [facility]    Culture   Final    NO GROWTH 3 DAYS Performed at [facility]    Report Status 08/22/2014 FINAL  Final     Studies:  Mr Brain Wo Contrast  08/25/2014   CLINICAL DATA:  Left-sided facial droop  EXAM: MRI HEAD WITHOUT CONTRAST  TECHNIQUE: Multiplanar, multiecho pulse sequences of the brain and surrounding structures were obtained without intravenous contrast.  COMPARISON:  None.  FINDINGS: Diffusion imaging does not show any acute or subacute infarction. The brainstem and cerebellum are normal. The cerebral hemispheres show mild chronic small-vessel change of the white matter. No cortical or large vessel territory infarction. No mass lesion, hemorrhage, hydrocephalus or extra-axial collection. No pituitary mass. No inflammatory sinus disease. Major vessels at the base of the brain show flow.  IMPRESSION: No acute or significant finding. Mild chronic small-vessel change of the cerebral hemispheric white matter.   Electronically Signed   By: Nelson Chimes M.D.   On: 08/25/2014 10:20    Scheduled Meds: . amLODipine  10 mg Oral Daily  . antiseptic oral rinse  7 mL Mouth Rinse q12n4p  . aspirin  81 mg Oral Daily  .  ceFAZolin (ANCEF) IV  2 g Intravenous Q12H  . chlorhexidine  15 mL Mouth Rinse BID  . cyclobenzaprine  5 mg Oral BID  . famotidine  20 mg Oral Daily  . ferrous gluconate  324 mg Oral Q breakfast  . hydrALAZINE  25 mg Oral TID  . insulin aspart  0-9 Units Subcutaneous TID WC  . nicotine  21 mg Transdermal Daily  . polyethylene glycol  17 g Oral BID  . sodium chloride  10-40 mL Intracatheter Q12H  . sodium chloride  3 mL Intravenous Q12H   Continuous Infusions: . sodium chloride 50 mL/hr at 08/24/14 2119    Principal Problem:   Staphylococcus aureus bacteremia Active Problems:   Septic joint of left shoulder region   Sinus tachycardia   Septic joint of right knee joint   CKD  (chronic kidney disease) stage 3, GFR 30-59 ml/min   Septic joint of right hand   SIRS (systemic inflammatory response syndrome)   Benign essential HTN   Hepatitis C   Acute renal failure   Acute renal failure syndrome   Hep C w/ coma, chronic   Joint infection    SINGH,PRASHANT K  [facility] Pager [phone]. If 7PM-7AM, please contact night-coverage at [email or web address], password [facility] 08/25/2014, 10:27 AM  LOS: 18 days      Assumed care of the patient on 08/23/2014 day 16 of her hospital stay

## 2014-08-25 NOTE — Progress Notes (Signed)
Patient complaining of panic attack this morning stating she feels anxious, placed on 2L oxygen with sat 96% at this time, patient reassured, 2 tab vicodin administered for c/o 7/10 pain with good effect, robitussin administered for complaints of cough with good effect, patient with no complaints of chest pain, sleeping comfortably at this time, will continue to monitor closely.

## 2014-08-25 NOTE — Progress Notes (Signed)
[facility] for Infectious Disease    Date of Admission:  08/07/2014   Total days of antibiotics 17        Day 11 cefazolin           ID: Brandy Houston is a 59 y.o. female with MSSA disseminated infection with bacteremia, polyarticular septic arthritis. Now complicated by nephrotic syndrome, AKI requiring HD  Principal Problem:   Staphylococcus aureus bacteremia Active Problems:   Septic joint of left shoulder region   Sinus tachycardia   Septic joint of right knee joint   CKD (chronic kidney disease) stage 3, GFR 30-59 ml/min   Septic joint of right hand   SIRS (systemic inflammatory response syndrome)   Benign essential HTN   Hepatitis C   Acute renal failure   Acute renal failure syndrome   Hep C w/ coma, chronic   Joint infection    Subjective: Underwent mri, negative for acute process. afebrile  Medications:  . amLODipine  10 mg Oral Daily  . antiseptic oral rinse  7 mL Mouth Rinse q12n4p  . aspirin  81 mg Oral Daily  .  ceFAZolin (ANCEF) IV  2 g Intravenous Q12H  . chlorhexidine  15 mL Mouth Rinse BID  . cyclobenzaprine  5 mg Oral BID  . [START ON 08/28/2014] darbepoetin (ARANESP) injection - NON-DIALYSIS  100 mcg Subcutaneous Once  . famotidine  20 mg Oral Daily  . ferrous gluconate  324 mg Oral Q breakfast  . furosemide  40 mg Intravenous Q12H  . hydrALAZINE  25 mg Oral TID  . insulin aspart  0-9 Units Subcutaneous TID WC  . nicotine  21 mg Transdermal Daily  . polyethylene glycol  17 g Oral BID  . sodium chloride  10-40 mL Intracatheter Q12H  . sodium chloride  3 mL Intravenous Q12H    Objective: Vital signs in last 24 hours: Temp:  [98.3 F (36.8 C)-99.3 F (37.4 C)] 98.3 F (36.8 C) (03/11 0510) Pulse Rate:  [102-120] 102 (03/11 1052) Resp:  [18] 18 (03/11 1052) BP: (144-161)/(72-79) 151/79 mmHg (03/11 1052) SpO2:  [92 %-96 %] 93 % (03/11 1052)  Did not examine  Lab Results  Recent Labs  08/23/14 0736 08/24/14 1240 08/25/14 0550  WBC  12.2*  --  11.2*  HGB 8.0*  --  7.2*  HCT 23.6*  --  21.5*  NA 132* 132* 133*  K 4.0 3.6 3.6  CL 101 100 98  CO2 20 23 22   BUN 46* 29* 33*  CREATININE 6.29* 4.88* 5.59*   Liver Panel  Recent Labs  08/24/14 1240 08/25/14 0550  ALBUMIN 1.2* 1.3*    Microbiology: 2/25 blood cx mssa 2/26 blood cx NGTD 2/25 AC joint fluid MSSA 3/1 blood cx ngtd 3/4 left knee synovial fluid NGTD Studies/Results: Mr Brain Wo Contrast  08/25/2014   CLINICAL DATA:  Left-sided facial droop  EXAM: MRI HEAD WITHOUT CONTRAST  TECHNIQUE: Multiplanar, multiecho pulse sequences of the brain and surrounding structures were obtained without intravenous contrast.  COMPARISON:  None.  FINDINGS: Diffusion imaging does not show any acute or subacute infarction. The brainstem and cerebellum are normal. The cerebral hemispheres show mild chronic small-vessel change of the white matter. No cortical or large vessel territory infarction. No mass lesion, hemorrhage, hydrocephalus or extra-axial collection. No pituitary mass. No inflammatory sinus disease. Major vessels at the base of the brain show flow.  IMPRESSION: No acute or significant finding. Mild chronic small-vessel change of the cerebral hemispheric white  matter.   Electronically Signed   By: Nelson Chimes M.D.   On: 08/25/2014 10:20     Assessment/Plan: Disseminated MSSA infection = would recommend a total of 8  wk of IV antibiotics with cefazolin (renally dosed). Use 2/26 as day 1 of 56 days. Extended it to 8 weeks due to having heavy disease burden, with polyarticular septic arthritis and bacteremia  Septic arthritis of left knee = no bacteria found in culture but had been on antibiotics prior to arthrocentesis  bilateraly shoulder septic arthritis = underwent I x D which appears to be improving slowly  AKI on CKD = had previous hx of proteinuria that had not been worked up. Renal bx showed post infectious nephritis. Anticipate to improve over time. Cr remains  a 5.59  Anemia = unclear if due to hematuria and multiple sticks. Will need to follow cbc to see if meets criteria for transfusion  HCV = can be a candidate for treatment even with CKD. Will need to follow up on treatment for HCV after finishing treatment for MSSA infection   Dr. Johnnye Sima available for questions over the weekend. Will check back on patient on Monday.  Baxter Flattery [facility] for Infectious Diseases Cell: [phone] Pager: [phone]  08/25/2014, 2:16 PM

## 2014-08-25 NOTE — Clinical Social Work Note (Signed)
CSW has received call from [facility]. Facility states they will "consider" patient for placement but only if a bed is available at time of discharge. CSW will continue to follow for DC needs.   Liz Beach MSW, [place], [place], [id number]

## 2014-08-25 NOTE — Progress Notes (Signed)
Physical Therapy Treatment Patient Details Name: Brandy Houston MRN: [id number] DOB: 11/10/1955 Today's Date: 08/25/2014    History of Present Illness MSSA bacteremia with Polyarticular septic arthrits  s/p I&D of BUE's AC joint, BLE knee joint, and right hand    PT Comments    Progressing slowly, limited by L knee pain and ROM.  Pt is motivated in sessions, but can't/doesn't do much work in bed after hours.  Follow Up Recommendations  SNF     Equipment Recommendations  None recommended by PT    Recommendations for Other Services       Precautions / Restrictions Precautions Precautions: Fall Restrictions RLE Weight Bearing: Weight bearing as tolerated LLE Weight Bearing: Weight bearing as tolerated    Mobility  Bed Mobility Overal bed mobility: Needs Assistance Bed Mobility: Supine to Sit     Supine to sit: Min assist (and rail)     General bed mobility comments: heavy use of rail with little truncal assist  Transfers Overall transfer level: Needs assistance Equipment used: Rolling walker (2 wheeled) Transfers: Sit to/from [facility] Sit to Stand: Min assist;+2 physical assistance Stand pivot transfers: Min assist;+2 safety/equipment       General transfer comment: assist for w/shift support and RW support while pt moving/ pivoting LE's.  Took 4 forward pivotal steps and 4 backward steps to the chair  Ambulation/Gait                 Stairs            Wheelchair Mobility    Modified Rankin (Stroke Patients Only)       Balance   Sitting-balance support: No upper extremity supported Sitting balance-Leahy Scale: Good Sitting balance - Comments: Completed 5 reps each of LAQ's, full extension on R LE and 20* lag on the L LE   Standing balance support: Bilateral upper extremity supported Standing balance-Leahy Scale: Poor Standing balance comment: Stood in RW x>55min working to accept weight into LE's prior to starting the  transfer                    Cognition Arousal/Alertness: Awake/alert Behavior During Therapy: WFL for tasks assessed/performed Overall Cognitive Status: Within Functional Limits for tasks assessed                      Exercises      General Comments General comments (skin integrity, edema, etc.): AAROM L knee prior to movement; bil knee flexion exercise in sitting EOB      Pertinent Vitals/Pain Pain Assessment: Faces Faces Pain Scale: Hurts even more Pain Location: L knee Pain Descriptors / Indicators: Aching;Sore Pain Intervention(s): Limited activity within patient's tolerance;Premedicated before session    Home Living                      Prior Function            PT Goals (current goals can now be found in the care plan section) Acute Rehab PT Goals Patient Stated Goal: go home, feel better PT Goal Formulation: With patient Time For Goal Achievement: 08/27/14 Potential to Achieve Goals: Good Progress towards PT goals: Progressing toward goals    Frequency  Min 3X/week    PT Plan Frequency needs to be updated;Discharge plan needs to be updated    Co-evaluation             End of Session   Activity Tolerance: Patient limited by  pain;Patient tolerated treatment well Patient left: in chair;with call bell/phone within reach;Other (comment) (lift pad)     Time: [id number] PT Time Calculation (min) (ACUTE ONLY): 21 min  Charges:  $Therapeutic Activity: 8-22 mins                    G Codes:      Asaiah Hunnicutt, [name] 08/25/2014, 4:26 PM 08/25/2014  Donnella Sham, PT [phone]  (pager)

## 2014-08-26 LAB — RENAL FUNCTION PANEL
Albumin: 1.2 g/dL — ABNORMAL LOW (ref 3.5–5.2)
Anion gap: 12 (ref 5–15)
BUN: 39 mg/dL — ABNORMAL HIGH (ref 6–23)
CO2: 23 mmol/L (ref 19–32)
Calcium: 8 mg/dL — ABNORMAL LOW (ref 8.4–10.5)
Chloride: 98 mmol/L (ref 96–112)
Creatinine, Ser: 6.49 mg/dL — ABNORMAL HIGH (ref 0.50–1.10)
GFR calc Af Amer: 7 mL/min — ABNORMAL LOW (ref >90)
GFR calc non Af Amer: 6 mL/min — ABNORMAL LOW (ref >90)
Glucose, Bld: 129 mg/dL — ABNORMAL HIGH (ref 70–99)
Phosphorus: 6.2 mg/dL — ABNORMAL HIGH (ref 2.3–4.6)
Potassium: 3.8 mmol/L (ref 3.5–5.1)
Sodium: 133 mmol/L — ABNORMAL LOW (ref 135–145)

## 2014-08-26 LAB — GLUCOSE, CAPILLARY
Glucose-Capillary: 160 mg/dL — ABNORMAL HIGH (ref 70–99)
Glucose-Capillary: 131 mg/dL — ABNORMAL HIGH (ref 70–99)
Glucose-Capillary: 149 mg/dL — ABNORMAL HIGH (ref 70–99)
Glucose-Capillary: 120 mg/dL — ABNORMAL HIGH (ref 70–99)
Glucose-Capillary: 135 mg/dL — ABNORMAL HIGH (ref 70–99)

## 2014-08-26 LAB — CBC
HCT: 22.7 % — ABNORMAL LOW (ref 36.0–46.0)
Hemoglobin: 7.5 g/dL — ABNORMAL LOW (ref 12.0–15.0)
MCH: 29.8 pg (ref 26.0–34.0)
MCHC: 33 g/dL (ref 30.0–36.0)
MCV: 90.1 fL (ref 78.0–100.0)
Platelets: 457 10*3/uL — ABNORMAL HIGH (ref 150–400)
RBC: 2.52 MIL/uL — ABNORMAL LOW (ref 3.87–5.11)
RDW: 15.1 % (ref 11.5–15.5)
WBC: 10.2 10*3/uL (ref 4.0–10.5)

## 2014-08-26 LAB — IRON AND TIBC
Iron: 28 ug/dL — ABNORMAL LOW (ref 42–145)
Saturation Ratios: 15 % — ABNORMAL LOW (ref 20–55)
TIBC: 183 ug/dL — ABNORMAL LOW (ref 250–470)
UIBC: 155 ug/dL (ref 125–400)

## 2014-08-26 LAB — FERRITIN: Ferritin: 546 ng/mL — ABNORMAL HIGH (ref 10–291)

## 2014-08-26 MED ORDER — DIPHENHYDRAMINE HCL 25 MG PO CAPS
25.0000 mg | ORAL_CAPSULE | Freq: Four times a day (QID) | ORAL | Status: DC | PRN
  Administered 2014-08-26 – 2014-09-11 (×9): 25 mg via ORAL
  Filled 2014-08-26 (×10): qty 1

## 2014-08-26 MED ORDER — FUROSEMIDE 10 MG/ML IJ SOLN
80.0000 mg | Freq: Two times a day (BID) | INTRAMUSCULAR | Status: AC
  Administered 2014-08-26 – 2014-08-27 (×3): 80 mg via INTRAVENOUS
  Filled 2014-08-26 (×3): qty 8

## 2014-08-26 NOTE — Progress Notes (Signed)
[name], female   DOB: 06/17/1955, 59 y.o.   MRN: [id number]  [facility] Progress Note    Assessment/ Plan:   1. 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 The treatment of this is supportive and management of the underlying infection. Marginal urine output-will order flushing of the Foley catheter to ensure that it is not obstructed. Retry furosemide. No indications for dialysis at this time. 2. MSSA bacteremia with septic arthritis - without underlying source of infection (negative TEE).   3. ABLA on anemia of chronic disease- monitor Hgb trend for PRBC needs. s/p ESA on Monday- will re-dose. She is on oral iron and repeat iron studies are pending 4. Hep C + with >8 million copies by PCR, ID following, cryoglobulin assay negative 5. HTN- stable and acceptable at current elevated levels 6. Hyponatremia- related to AKI and edema. Slightly improved with dialysis 7. DM- historically poorly controlled. Per primary svc  Subjective:   Reports to be doing well-denies any chest pain or shortness of breath.    Objective:   BP 150/73 mmHg  Pulse 104  Temp(Src) 98.3 F (36.8 C) (Oral)  Resp 19  Ht 5' 7.5" (1.715 m)  Wt 111.8 kg (246 lb 7.6 oz)  BMI 38.01 kg/m2  SpO2 94%  LMP 03/16/2002  Intake/Output Summary (Last 24 hours) at 08/26/14 0934 Last data filed at 08/26/14 [id number]  Gross per 24 hour  Intake    100 ml  Output    200 ml  Net   -100 ml   Weight change:   Physical Exam: Gen: Comfortably resting in bed, watching television CVS: Pulse regular tachycardia, S1 and S2 with ESM Resp: Coarse breath sounds-no distinct rales or rhonchi Abd: Soft, obese, nontender Ext: 2-3+ lower extremity and upper extremity edema  Imaging: Mr Brain Wo Contrast  08/25/2014   CLINICAL DATA:  Left-sided facial droop  EXAM: MRI HEAD WITHOUT CONTRAST   TECHNIQUE: Multiplanar, multiecho pulse sequences of the brain and surrounding structures were obtained without intravenous contrast.  COMPARISON:  None.  FINDINGS: Diffusion imaging does not show any acute or subacute infarction. The brainstem and cerebellum are normal. The cerebral hemispheres show mild chronic small-vessel change of the white matter. No cortical or large vessel territory infarction. No mass lesion, hemorrhage, hydrocephalus or extra-axial collection. No pituitary mass. No inflammatory sinus disease. Major vessels at the base of the brain show flow.  IMPRESSION: No acute or significant finding. Mild chronic small-vessel change of the cerebral hemispheric white matter.   Electronically Signed   By: Nelson Chimes M.D.   On: 08/25/2014 10:20    Labs: BMET  Recent Labs Lab 08/19/14 1101 08/20/14 1115 08/21/14 0721 08/22/14 0505 08/23/14 0736 08/24/14 1240 08/25/14 0550 08/26/14 0550  NA 131* 130* 128* 132* 132* 132* 133* 133*  K 3.8 4.8 4.7 3.8 4.0 3.6 3.6 3.8  CL 100 99 102 103 101 100 98 98  CO2 16* 20 16* 21 20 23 22 23   GLUCOSE 89 135* 117* 109* 118* 150* 130* 129*  BUN 49* 55* 62* 39* 46* 29* 33* 39*  CREATININE 5.94* 6.92* 7.20* 5.27* 6.29* 4.88* 5.59* 6.49*  CALCIUM 7.2* 7.2* 7.1* 7.2* 7.6* 7.4* 7.7* 8.0*  PHOS 6.8* 9.0* 9.2*  --   --  5.1* 5.5* 6.2*   CBC  Recent Labs Lab 08/22/14 0505 08/23/14 0736 08/25/14 0550 08/26/14 0550  WBC 12.7* 12.2* 11.2* 10.2  HGB 8.7*  8.0* 7.2* 7.5*  HCT 25.3* 23.6* 21.5* 22.7*  MCV 87.8 88.7 90.0 90.1  PLT 459* 507* 474* 457*    Medications:    . amLODipine  10 mg Oral Daily  . antiseptic oral rinse  7 mL Mouth Rinse q12n4p  . aspirin  81 mg Oral Daily  .  ceFAZolin (ANCEF) IV  2 g Intravenous Q12H  . chlorhexidine  15 mL Mouth Rinse BID  . cyclobenzaprine  5 mg Oral BID  . [START ON 08/28/2014] darbepoetin (ARANESP) injection - NON-DIALYSIS  100 mcg Subcutaneous Once  . famotidine  20 mg Oral Daily  . ferrous  gluconate  324 mg Oral Q breakfast  . hydrALAZINE  25 mg Oral TID  . insulin aspart  0-9 Units Subcutaneous TID WC  . nicotine  21 mg Transdermal Daily  . polyethylene glycol  17 g Oral BID  . sodium chloride  10-40 mL Intracatheter Q12H  . sodium chloride  3 mL Intravenous Q12H   Elmarie Shiley, MD 08/26/2014, 9:34 AM

## 2014-08-26 NOTE — Progress Notes (Signed)
[facility] PROGRESS NOTE  Brandy Houston [id number] DOB: 03/03/1956 DOA: 08/07/2014 PCP: Chari Manning, NP    Assumed care of the patient on 08/23/2014 day 16 of her hospital stay    Brief summary:   Patient is a 59 year old African-American female with history of hypertension, diabetes, peripheral neuropathy who presented to [place] emergency room on 2/22 with severe left shoulder pain. Further workup demonstrated MSSA bacteremia with septic arthritis of numerous joints. Patient underwent a TEE that was negative. Unfortunately continues to have intermittent fever and leukocytosis. ID continues to follow and is directing antibiotic treatment.  Events from 3/1-3/8: [name] renal function continued to worsen. Nephrology was consulted. [name] renal function continued to decline and the patient underwent renal biopsy and HD cath placement. Biopsy results remain pending. The patient has since undergone intermittent HD sessions. The patient also continued to be followed by ID with continuation of ancef, recs for 6 weeks of tx. Patient also noted to be HCV pos, to be treated ultimately as an outpatient after bacteremia is resolved     Subjective:  In bed, denies any headache, no chest abdominal pain, still having left shoulder and left knee pain. Overall feels stable, she has some chronic left-sided weakness but unchanged.   Assessment/Plan:   MSSA bacteremia with sepsis and Polyarticular MSSA septic arthrits: Bilateral knee and shoulder septic arthritis seen by ID and Ortho Dr [name], she underwent irrigation and debridement of Right knee, left knee, Right AC & left AC joints, and Irrigation and debridement of right hand flexor tendon sheath.  Underwent TEE on 2/29 that was negative.She again underwent R knee arthroscopic irrigation and debridement on 3/5. Leukocytosis stable.   ID recs total of 8 wk of IV antibiotics with cefazolin (renally dosed). Use 2/26 as day 1  of 56 days.       Anemia:secondary to acute illness/CKD/post-op bleeding  -Bleeding noted at post-op site from R knee. Pt was given 2 units PRBCs on HD on 3/7, monitor hemoglobin stable now.    DM-2:CBG's controlled with SSI. Had stopped Glipizide. A1c 8.4.  CBG (last 3)   Recent Labs  08/25/14 1512 08/25/14 2056 08/26/14 0814  GLUCAP 182* 135* 160*        HTN: Overall remains stable with hydralazine and amlodpine    Acute on CKD Stage 3: due to combination of sepsis, contrast nephropathy, postinfectious GN was his AIN from antibiotics. 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 Currently on temporary dialysis via right IJ dialysis catheter. Post biopsy hematuria improving - monitor.    Tobacco Abuse:transdermal nicotine, was counseled    Chronic hepatitis C: Pt is hep C positive. Discussed with ID.  No plans of initiating treatment until after patient's bacteremia is resolved, will follow with ID.    Left-sided facial droop. Likely chronic, I will obtain MRI of the brain with no acute changes on 08/25/2014. On 81 mg of aspirin continue.     Code Status: Full Family Communication: Pt in room  Disposition Plan: Pending DVT prophylaxis: SCD, will add Heparin once Hematuria has resolved    Consultants:  Nephrology  ID  CIR  Orthopedic Surgery  Anti-infectives    Start     Dose/Rate Route Frequency Ordered Stop   08/23/14 2200  fluconazole (DIFLUCAN) tablet 150 mg     150 mg Oral  Once 08/23/14 2151 08/23/14 2252   08/19/14 0500  ceFAZolin (ANCEF) IVPB 2  g/50 mL premix     2 g 100 mL/hr over 30 Minutes Intravenous Every 12 hours 08/18/14 1524     08/18/14 1800  ceFAZolin (ANCEF) IVPB 2 g/50 mL premix  Status:  Discontinued     2 g 100 mL/hr over 30 Minutes Intravenous Every 12 hours 08/18/14 1517 08/18/14 1524   08/18/14 1330   ceFAZolin (ANCEF) 2-3 GM-% IVPB SOLR    Comments:  Barley, Jenny   : cabinet override      08/18/14 1330 08/19/14 0144   08/18/14 0600  ceFAZolin (ANCEF) IVPB 1 g/50 mL premix  Status:  Discontinued     1 g 100 mL/hr over 30 Minutes Intravenous Every 12 hours 08/17/14 1822 08/18/14 1517   08/16/14 1800  ceFAZolin (ANCEF) IVPB 2 g/50 mL premix  Status:  Discontinued     2 g 100 mL/hr over 30 Minutes Intravenous Every 12 hours 08/16/14 1355 08/17/14 1822   08/15/14 1200  nafcillin 2 g in dextrose 5 % 50 mL IVPB  Status:  Discontinued     2 g 100 mL/hr over 30 Minutes Intravenous 6 times per day 08/15/14 1028 08/16/14 1253   08/14/14 1400  ceFAZolin (ANCEF) IVPB 2 g/50 mL premix  Status:  Discontinued     2 g 100 mL/hr over 30 Minutes Intravenous 3 times per day 08/14/14 0850 08/15/14 1028   08/12/14 1800  vancomycin (VANCOCIN) 500 mg in sodium chloride 0.9 % 100 mL IVPB  Status:  Discontinued     500 mg 100 mL/hr over 60 Minutes Intravenous Every 12 hours 08/12/14 0705 08/12/14 0721   08/12/14 1800  vancomycin (VANCOCIN) IVPB 750 mg/150 ml premix  Status:  Discontinued     750 mg 150 mL/hr over 60 Minutes Intravenous Every 12 hours 08/12/14 0721 08/12/14 1315   08/10/14 2138  vancomycin (VANCOCIN) powder  Status:  Discontinued       As needed 08/10/14 2138 08/10/14 2153   08/10/14 1600  cefTRIAXone (ROCEPHIN) 2 g in dextrose 5 % 50 mL IVPB - Premix  Status:  Discontinued     2 g 100 mL/hr over 30 Minutes Intravenous Every 24 hours 08/09/14 1555 08/10/14 1432   08/10/14 1600  ceFAZolin (ANCEF) IVPB 2 g/50 mL premix  Status:  Discontinued     2 g 100 mL/hr over 30 Minutes Intravenous 3 times per day 08/10/14 1432 08/14/14 0845   08/10/14 0600  vancomycin (VANCOCIN) IVPB 750 mg/150 ml premix  Status:  Discontinued     750 mg 150 mL/hr over 60 Minutes Intravenous Every 12 hours 08/09/14 2018 08/12/14 0705   08/09/14 1800  vancomycin (VANCOCIN) 2,000 mg in sodium chloride 0.9 % 500 mL IVPB      2,000 mg 250 mL/hr over 120 Minutes Intravenous  Once 08/09/14 1616 08/09/14 1929   08/09/14 1630  cefTRIAXone (ROCEPHIN) 2 g in dextrose 5 % 50 mL IVPB - Premix     2 g 100 mL/hr over 30 Minutes Intravenous  Once 08/09/14 1553 08/09/14 1653         Objective: Filed Vitals:   08/25/14 1517 08/25/14 2058 08/26/14 0547 08/26/14 0817  BP: 145/85 154/75 148/75 150/73  Pulse: 106 101 99 104  Temp: 98.1 F (36.7 C) 98.4 F (36.9 C) 98.3 F (36.8 C) 98.3 F (36.8 C)  TempSrc: Oral Oral Oral Oral  Resp: 18 18 17 19   Height:      Weight:  111.8 kg (246 lb 7.6 oz)    SpO2:  99% 93% 93% 94%    Intake/Output Summary (Last 24 hours) at 08/26/14 0926 Last data filed at 08/26/14 [id number]  Gross per 24 hour  Intake    100 ml  Output    200 ml  Net   -100 ml   Filed Weights   08/23/14 1652 08/23/14 2010 08/25/14 2058  Weight: 106.6 kg (235 lb 0.2 oz) 105 kg (231 lb 7.7 oz) 111.8 kg (246 lb 7.6 oz)    Exam:   General:  Asleep in be, arousable, in nad, chronic left-sided facial droop  Cardiovascular: regular, s1, s2  Respiratory: normal resp effort, no wheezing  Abdomen: soft,nondistended  Musculoskeletal: perfused, B pitting LE edema, post-op dressings over R knee, R knee still tender on palpation  Data Reviewed: Basic Metabolic Panel:  Recent Labs Lab 08/20/14 1115 08/21/14 0721 08/22/14 0505 08/23/14 0736 08/24/14 1240 08/25/14 0550 08/26/14 0550  NA 130* 128* 132* 132* 132* 133* 133*  K 4.8 4.7 3.8 4.0 3.6 3.6 3.8  CL 99 102 103 101 100 98 98  CO2 20 16* 21 20 23 22 23   GLUCOSE 135* 117* 109* 118* 150* 130* 129*  BUN 55* 62* 39* 46* 29* 33* 39*  CREATININE 6.92* 7.20* 5.27* 6.29* 4.88* 5.59* 6.49*  CALCIUM 7.2* 7.1* 7.2* 7.6* 7.4* 7.7* 8.0*  PHOS 9.0* 9.2*  --   --  5.1* 5.5* 6.2*   Liver Function Tests:  Recent Labs Lab 08/20/14 1115 08/21/14 0721 08/24/14 1240 08/25/14 0550 08/26/14 0550  ALBUMIN 1.1* 1.2* 1.2* 1.3* 1.2*   No results for  input(s): LIPASE, AMYLASE in the last 168 hours. No results for input(s): AMMONIA in the last 168 hours. CBC:  Recent Labs Lab 08/21/14 0721 08/21/14 2132 08/22/14 0505 08/23/14 0736 08/25/14 0550 08/26/14 0550  WBC 13.7*  --  12.7* 12.2* 11.2* 10.2  HGB 6.7* 8.5* 8.7* 8.0* 7.2* 7.5*  HCT 19.4* 24.0* 25.3* 23.6* 21.5* 22.7*  MCV 89.8  --  87.8 88.7 90.0 90.1  PLT 493*  --  459* 507* 474* 457*   Cardiac Enzymes: No results for input(s): CKTOTAL, CKMB, CKMBINDEX, TROPONINI in the last 168 hours. BNP (last 3 results) No results for input(s): BNP in the last 8760 hours.  ProBNP (last 3 results) No results for input(s): PROBNP in the last 8760 hours.  CBG:  Recent Labs Lab 08/25/14 0820 08/25/14 1220 08/25/14 1512 08/25/14 2056 08/26/14 0814  GLUCAP 125* 118* 182* 135* 160*    Recent Results (from the past 240 hour(s))  Culture, Urine     Status: None   Collection Time: 08/17/14  3:14 PM  Result Value Ref Range Status   Specimen Description URINE, CATHETERIZED  Final   Special Requests NONE  Final   Colony Count NO GROWTH Performed at [facility]   Final   Culture NO GROWTH Performed at [facility]   Final   Report Status 08/18/2014 FINAL  Final  Body fluid culture     Status: None   Collection Time: 08/18/14  4:52 PM  Result Value Ref Range Status   Specimen Description FLUID SYNOVIAL LEFT KNEE  Final   Special Requests Normal  Final   Gram Stain   Final    MODERATE WBC PRESENT,BOTH PMN AND MONONUCLEAR NO ORGANISMS SEEN Performed at [facility]    Culture   Final    NO GROWTH 3 DAYS Performed at [facility]    Report Status 08/22/2014 FINAL  Final  Studies:  Mr Brain Wo Contrast  08/25/2014   CLINICAL DATA:  Left-sided facial droop  EXAM: MRI HEAD WITHOUT CONTRAST  TECHNIQUE: Multiplanar, multiecho pulse sequences of the brain and surrounding structures were obtained without intravenous contrast.  COMPARISON:   None.  FINDINGS: Diffusion imaging does not show any acute or subacute infarction. The brainstem and cerebellum are normal. The cerebral hemispheres show mild chronic small-vessel change of the white matter. No cortical or large vessel territory infarction. No mass lesion, hemorrhage, hydrocephalus or extra-axial collection. No pituitary mass. No inflammatory sinus disease. Major vessels at the base of the brain show flow.  IMPRESSION: No acute or significant finding. Mild chronic small-vessel change of the cerebral hemispheric white matter.   Electronically Signed   By: Nelson Chimes M.D.   On: 08/25/2014 10:20    Scheduled Meds: . amLODipine  10 mg Oral Daily  . antiseptic oral rinse  7 mL Mouth Rinse q12n4p  . aspirin  81 mg Oral Daily  .  ceFAZolin (ANCEF) IV  2 g Intravenous Q12H  . chlorhexidine  15 mL Mouth Rinse BID  . cyclobenzaprine  5 mg Oral BID  . [START ON 08/28/2014] darbepoetin (ARANESP) injection - NON-DIALYSIS  100 mcg Subcutaneous Once  . famotidine  20 mg Oral Daily  . ferrous gluconate  324 mg Oral Q breakfast  . hydrALAZINE  25 mg Oral TID  . insulin aspart  0-9 Units Subcutaneous TID WC  . nicotine  21 mg Transdermal Daily  . polyethylene glycol  17 g Oral BID  . sodium chloride  10-40 mL Intracatheter Q12H  . sodium chloride  3 mL Intravenous Q12H   Continuous Infusions:    Principal Problem:   Staphylococcus aureus bacteremia Active Problems:   Septic joint of left shoulder region   Sinus tachycardia   Septic joint of right knee joint   CKD (chronic kidney disease) stage 3, GFR 30-59 ml/min   Septic joint of right hand   SIRS (systemic inflammatory response syndrome)   Benign essential HTN   Hepatitis C   Acute renal failure   Acute renal failure syndrome   Hep C w/ coma, chronic   Joint infection    SINGH,PRASHANT K  [facility] Pager [phone]. If 7PM-7AM, please contact night-coverage at [email or web address], password [facility] 08/26/2014, 9:26 AM  LOS:  19 days      Assumed care of the patient on 08/23/2014 day 16 of her hospital stay

## 2014-08-27 LAB — GLUCOSE, CAPILLARY
Glucose-Capillary: 131 mg/dL — ABNORMAL HIGH (ref 70–99)
Glucose-Capillary: 116 mg/dL — ABNORMAL HIGH (ref 70–99)

## 2014-08-27 LAB — RENAL FUNCTION PANEL
Albumin: 1.1 g/dL — ABNORMAL LOW (ref 3.5–5.2)
Anion gap: 11 (ref 5–15)
BUN: 44 mg/dL — ABNORMAL HIGH (ref 6–23)
CO2: 23 mmol/L (ref 19–32)
Calcium: 7.8 mg/dL — ABNORMAL LOW (ref 8.4–10.5)
Chloride: 97 mmol/L (ref 96–112)
Creatinine, Ser: 7.05 mg/dL — ABNORMAL HIGH (ref 0.50–1.10)
GFR calc Af Amer: 7 mL/min — ABNORMAL LOW (ref >90)
GFR calc non Af Amer: 6 mL/min — ABNORMAL LOW (ref >90)
Glucose, Bld: 121 mg/dL — ABNORMAL HIGH (ref 70–99)
Phosphorus: 6.7 mg/dL — ABNORMAL HIGH (ref 2.3–4.6)
Potassium: 3.6 mmol/L (ref 3.5–5.1)
Sodium: 131 mmol/L — ABNORMAL LOW (ref 135–145)

## 2014-08-27 LAB — CBC
HCT: 21.9 % — ABNORMAL LOW (ref 36.0–46.0)
Hemoglobin: 7.1 g/dL — ABNORMAL LOW (ref 12.0–15.0)
MCH: 29.2 pg (ref 26.0–34.0)
MCHC: 32.4 g/dL (ref 30.0–36.0)
MCV: 90.1 fL (ref 78.0–100.0)
Platelets: 434 10*3/uL — ABNORMAL HIGH (ref 150–400)
RBC: 2.43 MIL/uL — ABNORMAL LOW (ref 3.87–5.11)
RDW: 15 % (ref 11.5–15.5)
WBC: 9.5 10*3/uL (ref 4.0–10.5)

## 2014-08-27 LAB — PREPARE RBC (CROSSMATCH)

## 2014-08-27 MED ORDER — METOLAZONE 5 MG PO TABS
5.0000 mg | ORAL_TABLET | Freq: Once | ORAL | Status: AC
  Administered 2014-08-27: 5 mg via ORAL
  Filled 2014-08-27: qty 1

## 2014-08-27 MED ORDER — FUROSEMIDE 10 MG/ML IJ SOLN
120.0000 mg | Freq: Two times a day (BID) | INTRAMUSCULAR | Status: AC
  Administered 2014-08-27 – 2014-08-28 (×2): 120 mg via INTRAVENOUS
  Filled 2014-08-27 (×2): qty 12

## 2014-08-27 MED ORDER — LORAZEPAM 2 MG/ML IJ SOLN
0.5000 mg | Freq: Once | INTRAMUSCULAR | Status: AC
  Administered 2014-08-27: 0.5 mg via INTRAVENOUS
  Filled 2014-08-27: qty 1

## 2014-08-27 MED ORDER — ZOLPIDEM TARTRATE 5 MG PO TABS
5.0000 mg | ORAL_TABLET | Freq: Every evening | ORAL | Status: DC | PRN
  Administered 2014-08-30 – 2014-09-11 (×9): 5 mg via ORAL
  Filled 2014-08-27 (×12): qty 1

## 2014-08-27 MED ORDER — METOPROLOL TARTRATE 50 MG PO TABS
50.0000 mg | ORAL_TABLET | Freq: Two times a day (BID) | ORAL | Status: DC
  Administered 2014-08-27 – 2014-08-29 (×5): 50 mg via ORAL
  Filled 2014-08-27 (×8): qty 1

## 2014-08-27 MED ORDER — SODIUM CHLORIDE 0.9 % IV SOLN
510.0000 mg | INTRAVENOUS | Status: AC
  Administered 2014-08-28 – 2014-09-03 (×2): 510 mg via INTRAVENOUS
  Filled 2014-08-27 (×3): qty 17

## 2014-08-27 MED ORDER — SODIUM CHLORIDE 0.9 % IV SOLN: Freq: Once | INTRAVENOUS | Status: DC

## 2014-08-27 NOTE — Progress Notes (Signed)
[name], female   DOB: 1955-09-08, 59 y.o.   MRN: [id number]  [facility] Progress Note    Assessment/ Plan:   1. 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 Continue supportive management/treatment of underlying infection as we monitor for renal recovery. Unfortunately, marginal urine output with continued volume overload and continued (albeit slow) rise of creatinine. Plan for hemodialysis today primarily for volume management. Consult interventional radiology to convert temporary dialysis catheter to tunneled dialysis catheter. Challenge with high-dose diuretic again today. 2. MSSA bacteremia with septic arthritis - without underlying source of infection (negative TEE).Continue antibiotic therapy.  3. ABLA on anemia of chronic disease- monitor Hgb trend for PRBC needs. s/p ESA on Monday- will re-dose. With iron deficiency-start intravenous iron therapy to augment ongoing ESA. 4. Hep C + with >8 million copies by PCR, ID following, cryoglobulin assay negative 5. HTN- stable and anticipate to improve with diuresis/ultrafiltration and hemodialysis 6. Hyponatremia- related to AKI and edema.Continue fluid restriction and monitor with dialysis 7. DM- historically poorly controlled. Per primary svc  Subjective:   Reports that she is in discomfort from pedal edema/pain in her joints.    Objective:   BP 157/77 mmHg  Pulse 103  Temp(Src) 98.6 F (37 C) (Oral)  Resp 18  Ht 5' 7.5" (1.715 m)  Wt 112.129 kg (247 lb 3.2 oz)  BMI 38.12 kg/m2  SpO2 94%  LMP 03/16/2002  Intake/Output Summary (Last 24 hours) at 08/27/14 1011 Last data filed at 08/27/14 0421  Gross per 24 hour  Intake    910 ml  Output    400 ml  Net    510 ml   Weight change: 0.329 kg (11.6 oz)  Physical Exam: Gen: Appears to be uncomfortable resting on the side of her bed CVS:  Pulse regular tachycardia, S1 and S2 with ESM Resp: Clear to auscultation, no rales Abd: Soft, obese, nontender Ext: 3+ lower extremity edema, 2+ upper extremity edema  Imaging: No results found.  Labs: BMET  Recent Labs Lab 08/20/14 1115 08/21/14 0721 08/22/14 0505 08/23/14 0736 08/24/14 1240 08/25/14 0550 08/26/14 0550 08/27/14 0545  NA 130* 128* 132* 132* 132* 133* 133* 131*  K 4.8 4.7 3.8 4.0 3.6 3.6 3.8 3.6  CL 99 102 103 101 100 98 98 97  CO2 20 16* 21 20 23 22 23 23   GLUCOSE 135* 117* 109* 118* 150* 130* 129* 121*  BUN 55* 62* 39* 46* 29* 33* 39* 44*  CREATININE 6.92* 7.20* 5.27* 6.29* 4.88* 5.59* 6.49* 7.05*  CALCIUM 7.2* 7.1* 7.2* 7.6* 7.4* 7.7* 8.0* 7.8*  PHOS 9.0* 9.2*  --   --  5.1* 5.5* 6.2* 6.7*   CBC  Recent Labs Lab 08/23/14 0736 08/25/14 0550 08/26/14 0550 08/27/14 0545  WBC 12.2* 11.2* 10.2 9.5  HGB 8.0* 7.2* 7.5* 7.1*  HCT 23.6* 21.5* 22.7* 21.9*  MCV 88.7 90.0 90.1 90.1  PLT 507* 474* 457* 434*    Medications:    . sodium chloride   Intravenous Once  . antiseptic oral rinse  7 mL Mouth Rinse q12n4p  . aspirin  81 mg Oral Daily  .  ceFAZolin (ANCEF) IV  2 g Intravenous Q12H  . chlorhexidine  15 mL Mouth Rinse BID  . cyclobenzaprine  5 mg Oral BID  . [START ON 08/28/2014] darbepoetin (ARANESP) injection - NON-DIALYSIS  100 mcg Subcutaneous Once  . famotidine  20 mg Oral Daily  .  ferrous gluconate  324 mg Oral Q breakfast  . hydrALAZINE  25 mg Oral TID  . insulin aspart  0-9 Units Subcutaneous TID WC  . metoprolol tartrate  50 mg Oral BID  . nicotine  21 mg Transdermal Daily  . polyethylene glycol  17 g Oral BID  . sodium chloride  10-40 mL Intracatheter Q12H  . sodium chloride  3 mL Intravenous Q12H    Elmarie Shiley, MD 08/27/2014, 10:11 AM

## 2014-08-27 NOTE — Progress Notes (Signed)
[facility] PROGRESS NOTE  Brandy Houston [id number] DOB: [birth year] DOA: 08/07/2014 PCP: Chari Manning, NP    Assumed care of the patient on 08/23/2014 day 16 of her hospital stay    Brief summary:   Patient is a 59 year old African-American female with history of hypertension, diabetes, peripheral neuropathy who presented to [facility] emergency room on 2/22 with severe left shoulder pain. Further workup demonstrated MSSA bacteremia with septic arthritis of numerous joints. Patient underwent a TEE that was negative. Unfortunately continues to have intermittent fever and leukocytosis. ID continues to follow and is directing antibiotic treatment.   Events from 3/1-3/8: [name] renal function continued to worsen. Nephrology was consulted. [name] renal function continued to decline and the patient underwent renal biopsy and HD cath placement. Biopsy results remain pending. The patient has since undergone intermittent HD sessions. The patient also continued to be followed by ID with continuation of ancef, recs for 6 weeks of tx. Patient also noted to be HCV pos, to be treated ultimately as an outpatient after bacteremia is resolved     Subjective:  In bed, denies any headache, no chest abdominal pain, still having left shoulder and left knee pain. Overall feels stable, she has some chronic left-sided weakness but unchanged.   Assessment/Plan:   MSSA bacteremia with sepsis and Polyarticular MSSA septic arthrits: Bilateral knee and shoulder septic arthritis seen by ID and Ortho Dr [name], she underwent irrigation and debridement of Right knee, left knee, Right AC & left AC joints, and Irrigation and debridement of right hand flexor tendon sheath.  Underwent TEE on 2/29 that was negative.She again underwent R knee arthroscopic irrigation and debridement on 3/5. Leukocytosis stable.   ID recs total of 8 wk of IV antibiotics with cefazolin (renally dosed). Use 2/26 as day 1  of 56 days.       Anemia:secondary to acute illness/CKD/post-op bleeding from the knee along with postbiopsy hematuria -Bleeding  has improved Pt was given 2 units PRBCs on HD on 3/7,  H&H gradually dropping down again due to multiple blood draws and minimal hematuria, we'll transfuse 1 more unit with next dialysis treatment on 08/28/2014 or 08/29/2014.    DM-2:CBG's controlled with SSI. Had stopped Glipizide. A1c 8.4.  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 Currently on temporary dialysis via right IJ dialysis catheter. Post biopsy hematuria improving - monitor.   Foley catheter came out on 08/26/2014, we keep out, bladder scan every 6 hours, last bladder scan shows 56 mL of urine. She has chronic sensation of bladder fullness. Will continue to monitor with every 6 hours bladder scans.    Tobacco Abuse:transdermal nicotine, was counseled    Chronic hepatitis C: Pt is hep C positive. Discussed with ID.  No plans of initiating treatment until after patient's bacteremia is resolved, will follow with ID.    Left-sided facial droop. Likely chronic, I will obtain MRI of the brain with no acute changes on 08/25/2014. On 81 mg of aspirin continue.     Code Status: Full Family Communication: Pt in room  Disposition Plan: Pending  DVT prophylaxis: SCD, will add Heparin once Hematuria has resolved    Consultants:  Nephrology  ID  CIR  Orthopedic Surgery  Anti-infectives    Start     Dose/Rate Route Frequency Ordered Stop   08/23/14 2200   fluconazole (DIFLUCAN) tablet 150 mg     150 mg Oral  Once 08/23/14 2151 08/23/14 2252   08/19/14 0500  ceFAZolin (ANCEF) IVPB 2 g/50 mL premix     2 g 100 mL/hr over 30 Minutes Intravenous Every 12 hours 08/18/14 1524     08/18/14 1800  ceFAZolin (ANCEF) IVPB 2 g/50 mL premix  Status:  Discontinued     2 g 100 mL/hr over 30 Minutes Intravenous Every 12 hours 08/18/14 1517 08/18/14 1524   08/18/14 1330  ceFAZolin (ANCEF) 2-3 GM-% IVPB SOLR    Comments:  Barley, Jenny   : cabinet override      08/18/14 1330 08/19/14 0144   08/18/14 0600  ceFAZolin (ANCEF) IVPB 1 g/50 mL premix  Status:  Discontinued     1 g 100 mL/hr over 30 Minutes Intravenous Every 12 hours 08/17/14 1822 08/18/14 1517   08/16/14 1800  ceFAZolin (ANCEF) IVPB 2 g/50 mL premix  Status:  Discontinued     2 g 100 mL/hr over 30 Minutes Intravenous Every 12 hours 08/16/14 1355 08/17/14 1822   08/15/14 1200  nafcillin 2 g in dextrose 5 % 50 mL IVPB  Status:  Discontinued     2 g 100 mL/hr over 30 Minutes Intravenous 6 times per day 08/15/14 1028 08/16/14 1253   08/14/14 1400  ceFAZolin (ANCEF) IVPB 2 g/50 mL premix  Status:  Discontinued     2 g 100 mL/hr over 30 Minutes Intravenous 3 times per day 08/14/14 0850 08/15/14 1028   08/12/14 1800  vancomycin (VANCOCIN) 500 mg in sodium chloride 0.9 % 100 mL IVPB  Status:  Discontinued     500 mg 100 mL/hr over 60 Minutes Intravenous Every 12 hours 08/12/14 0705 08/12/14 0721   08/12/14 1800  vancomycin (VANCOCIN) IVPB 750 mg/150 ml premix  Status:  Discontinued     750 mg 150 mL/hr over 60 Minutes Intravenous Every 12 hours 08/12/14 0721 08/12/14 1315   08/10/14 2138  vancomycin (VANCOCIN) powder  Status:  Discontinued       As needed 08/10/14 2138 08/10/14 2153   08/10/14 1600  cefTRIAXone (ROCEPHIN) 2 g in dextrose 5 % 50 mL IVPB - Premix  Status:  Discontinued     2 g 100 mL/hr over 30 Minutes Intravenous Every 24 hours 08/09/14 1555 08/10/14 1432   08/10/14 1600  ceFAZolin  (ANCEF) IVPB 2 g/50 mL premix  Status:  Discontinued     2 g 100 mL/hr over 30 Minutes Intravenous 3 times per day 08/10/14 1432 08/14/14 0845   08/10/14 0600  vancomycin (VANCOCIN) IVPB 750 mg/150 ml premix  Status:  Discontinued     750 mg 150 mL/hr over 60 Minutes Intravenous Every 12 hours 08/09/14 2018 08/12/14 0705   08/09/14 1800  vancomycin (VANCOCIN) 2,000 mg in sodium chloride 0.9 % 500 mL IVPB     2,000 mg 250 mL/hr over 120 Minutes Intravenous  Once 08/09/14 1616 08/09/14 1929   08/09/14 1630  cefTRIAXone (ROCEPHIN) 2 g in dextrose 5 % 50 mL IVPB - Premix     2 g 100 mL/hr over 30 Minutes Intravenous  Once 08/09/14 1553 08/09/14 1653         Objective: Filed Vitals:  08/26/14 1647 08/26/14 2150 08/27/14 0542 08/27/14 0844  BP: 158/76 146/74 141/72 157/77  Pulse: 105 102 99 103  Temp: 98.8 F (37.1 C) 98.6 F (37 C) 97.7 F (36.5 C) 98.6 F (37 C)  TempSrc: Oral Oral Oral Oral  Resp: 18 18 19 18   Height:      Weight:  112.129 kg (247 lb 3.2 oz)    SpO2: 95% 93% 92% 94%    Intake/Output Summary (Last 24 hours) at 08/27/14 0938 Last data filed at 08/27/14 0421  Gross per 24 hour  Intake    910 ml  Output    400 ml  Net    510 ml   Filed Weights   08/23/14 2010 08/25/14 2058 08/26/14 2150  Weight: 105 kg (231 lb 7.7 oz) 111.8 kg (246 lb 7.6 oz) 112.129 kg (247 lb 3.2 oz)    Exam:   General:  Asleep in be, arousable, in nad, chronic left-sided facial droop  Cardiovascular: regular, s1, s2  Respiratory: normal resp effort, no wheezing  Abdomen: soft,nondistended  Musculoskeletal: perfused, B pitting LE edema, post-op dressings over R knee, R knee still tender on palpation  Data Reviewed: Basic Metabolic Panel:  Recent Labs Lab 08/21/14 0721  08/23/14 0736 08/24/14 1240 08/25/14 0550 08/26/14 0550 08/27/14 0545  NA 128*  < > 132* 132* 133* 133* 131*  K 4.7  < > 4.0 3.6 3.6 3.8 3.6  CL 102  < > 101 100 98 98 97  CO2 16*  < > 20 23 22 23  23   GLUCOSE 117*  < > 118* 150* 130* 129* 121*  BUN 62*  < > 46* 29* 33* 39* 44*  CREATININE 7.20*  < > 6.29* 4.88* 5.59* 6.49* 7.05*  CALCIUM 7.1*  < > 7.6* 7.4* 7.7* 8.0* 7.8*  PHOS 9.2*  --   --  5.1* 5.5* 6.2* 6.7*  < > = values in this interval not displayed. Liver Function Tests:  Recent Labs Lab 08/21/14 0721 08/24/14 1240 08/25/14 0550 08/26/14 0550 08/27/14 0545  ALBUMIN 1.2* 1.2* 1.3* 1.2* 1.1*   No results for input(s): LIPASE, AMYLASE in the last 168 hours. No results for input(s): AMMONIA in the last 168 hours. CBC:  Recent Labs Lab 08/22/14 0505 08/23/14 0736 08/25/14 0550 08/26/14 0550 08/27/14 0545  WBC 12.7* 12.2* 11.2* 10.2 9.5  HGB 8.7* 8.0* 7.2* 7.5* 7.1*  HCT 25.3* 23.6* 21.5* 22.7* 21.9*  MCV 87.8 88.7 90.0 90.1 90.1  PLT 459* 507* 474* 457* 434*   Cardiac Enzymes: No results for input(s): CKTOTAL, CKMB, CKMBINDEX, TROPONINI in the last 168 hours. BNP (last 3 results) No results for input(s): BNP in the last 8760 hours.  ProBNP (last 3 results) No results for input(s): PROBNP in the last 8760 hours.  CBG:  Recent Labs Lab 08/26/14 0814 08/26/14 1158 08/26/14 1641 08/26/14 1749 08/26/14 2147  GLUCAP 160* 131* 149* 120* 135*    Recent Results (from the past 240 hour(s))  Culture, Urine     Status: None   Collection Time: 08/17/14  3:14 PM  Result Value Ref Range Status   Specimen Description URINE, CATHETERIZED  Final   Special Requests NONE  Final   Colony Count NO GROWTH Performed at [facility]   Final   Culture NO GROWTH Performed at [facility]   Final   Report Status 08/18/2014 FINAL  Final  Body fluid culture     Status: None   Collection Time: 08/18/14  4:52  PM  Result Value Ref Range Status   Specimen Description FLUID SYNOVIAL LEFT KNEE  Final   Special Requests Normal  Final   Gram Stain   Final    MODERATE WBC PRESENT,BOTH PMN AND MONONUCLEAR NO ORGANISMS SEEN Performed at [facility]    Culture   Final    NO GROWTH 3 DAYS Performed at [facility]    Report Status 08/22/2014 FINAL  Final     Studies:  Mr Brain Wo Contrast  08/25/2014   CLINICAL DATA:  Left-sided facial droop  EXAM: MRI HEAD WITHOUT CONTRAST  TECHNIQUE: Multiplanar, multiecho pulse sequences of the brain and surrounding structures were obtained without intravenous contrast.  COMPARISON:  None.  FINDINGS: Diffusion imaging does not show any acute or subacute infarction. The brainstem and cerebellum are normal. The cerebral hemispheres show mild chronic small-vessel change of the white matter. No cortical or large vessel territory infarction. No mass lesion, hemorrhage, hydrocephalus or extra-axial collection. No pituitary mass. No inflammatory sinus disease. Major vessels at the base of the brain show flow.  IMPRESSION: No acute or significant finding. Mild chronic small-vessel change of the cerebral hemispheric white matter.   Electronically Signed   By: Nelson Chimes M.D.   On: 08/25/2014 10:20    Scheduled Meds: . sodium chloride   Intravenous Once  . antiseptic oral rinse  7 mL Mouth Rinse q12n4p  . aspirin  81 mg Oral Daily  .  ceFAZolin (ANCEF) IV  2 g Intravenous Q12H  . chlorhexidine  15 mL Mouth Rinse BID  . cyclobenzaprine  5 mg Oral BID  . [START ON 08/28/2014] darbepoetin (ARANESP) injection - NON-DIALYSIS  100 mcg Subcutaneous Once  . famotidine  20 mg Oral Daily  . ferrous gluconate  324 mg Oral Q breakfast  . hydrALAZINE  25 mg Oral TID  . insulin aspart  0-9 Units Subcutaneous TID WC  . metoprolol tartrate  50 mg Oral BID  . nicotine  21 mg Transdermal Daily  . polyethylene glycol  17 g Oral BID  . sodium chloride  10-40 mL Intracatheter Q12H  . sodium chloride  3 mL Intravenous Q12H   Continuous Infusions:    Principal Problem:   Staphylococcus aureus bacteremia Active Problems:   Septic joint of left shoulder region   Sinus tachycardia   Septic joint of  right knee joint   CKD (chronic kidney disease) stage 3, GFR 30-59 ml/min   Septic joint of right hand   SIRS (systemic inflammatory response syndrome)   Benign essential HTN   Hepatitis C   Acute renal failure   Acute renal failure syndrome   Hep C w/ coma, chronic   Joint infection    SINGH,PRASHANT K  [facility] Pager [phone]. If 7PM-7AM, please contact night-coverage at [email or web address], password [facility] 08/27/2014, 9:38 AM  LOS: 20 days      Assumed care of the patient on 08/23/2014 day 16 of her hospital stay

## 2014-08-27 NOTE — Progress Notes (Signed)
Pt was complaining of pressure and stating that she felt like she had to urinate but couldn't after an foley catheter was inserted on prior shift. Urine was blood tinged with a few clots. The catheter site was accessed and balloon was inflated properly and catheter was intact. Pt was not having any relief. Rapid response was called. Received instructions to irrigate foley and keep record of the input and output. If they were not the same then to call the MD. Pt's bladder was scanned first and showed >56cc at the most. Catheter was irrigated with 30cc. After an hour her output was measured at50cc. With some maneuvering of the catheter. Pt stated she felt some relief. Pt then called after 4hrs later and stated that the pressure was back and she wanted the catheter out. I attempted to irrigated it with 60cc and they came back out blood tinged. Pt stated she would feel better with the foley out. Foley was d/c'd per pt. Will notify day shift nurse to ask MD for a Urology consult to have foley replaced.

## 2014-08-28 LAB — CBC
HCT: 22.6 % — ABNORMAL LOW (ref 36.0–46.0)
Hemoglobin: 7.4 g/dL — ABNORMAL LOW (ref 12.0–15.0)
MCH: 29.2 pg (ref 26.0–34.0)
MCHC: 32.7 g/dL (ref 30.0–36.0)
MCV: 89.3 fL (ref 78.0–100.0)
Platelets: 476 10*3/uL — ABNORMAL HIGH (ref 150–400)
RBC: 2.53 MIL/uL — ABNORMAL LOW (ref 3.87–5.11)
RDW: 14.9 % (ref 11.5–15.5)
WBC: 9.9 10*3/uL (ref 4.0–10.5)

## 2014-08-28 LAB — RENAL FUNCTION PANEL
Albumin: 1.1 g/dL — ABNORMAL LOW (ref 3.5–5.2)
Anion gap: 6 (ref 5–15)
BUN: 51 mg/dL — ABNORMAL HIGH (ref 6–23)
CO2: 28 mmol/L (ref 19–32)
Calcium: 7.8 mg/dL — ABNORMAL LOW (ref 8.4–10.5)
Chloride: 96 mmol/L (ref 96–112)
Creatinine, Ser: 7.62 mg/dL — ABNORMAL HIGH (ref 0.50–1.10)
GFR calc Af Amer: 6 mL/min — ABNORMAL LOW (ref >90)
GFR calc non Af Amer: 5 mL/min — ABNORMAL LOW (ref >90)
Glucose, Bld: 114 mg/dL — ABNORMAL HIGH (ref 70–99)
Phosphorus: 7.1 mg/dL — ABNORMAL HIGH (ref 2.3–4.6)
Potassium: 4 mmol/L (ref 3.5–5.1)
Sodium: 130 mmol/L — ABNORMAL LOW (ref 135–145)

## 2014-08-28 LAB — HEMOGLOBIN AND HEMATOCRIT, BLOOD
HCT: 26.1 % — ABNORMAL LOW (ref 36.0–46.0)
Hemoglobin: 8.7 g/dL — ABNORMAL LOW (ref 12.0–15.0)

## 2014-08-28 LAB — HEPATIC FUNCTION PANEL
ALT: <5 U/L (ref 0–35)
AST: 12 U/L (ref 0–37)
Albumin: 1.2 g/dL — ABNORMAL LOW (ref 3.5–5.2)
Alkaline Phosphatase: 100 U/L (ref 39–117)
Bilirubin, Direct: <0.1 mg/dL (ref 0.0–0.5)
Total Bilirubin: 0.7 mg/dL (ref 0.3–1.2)
Total Protein: 6.7 g/dL (ref 6.0–8.3)

## 2014-08-28 LAB — PROTIME-INR
INR: 7.1 (ref 0.00–1.49)
INR: 7.61 (ref 0.00–1.49)
INR: 5.82 (ref 0.00–1.49)
Prothrombin Time: 61.5 seconds — ABNORMAL HIGH (ref 11.6–15.2)
Prothrombin Time: 64.9 seconds — ABNORMAL HIGH (ref 11.6–15.2)
Prothrombin Time: 52.7 seconds — ABNORMAL HIGH (ref 11.6–15.2)

## 2014-08-28 LAB — GLUCOSE, CAPILLARY
Glucose-Capillary: 87 mg/dL (ref 70–99)
Glucose-Capillary: 92 mg/dL (ref 70–99)
Glucose-Capillary: 137 mg/dL — ABNORMAL HIGH (ref 70–99)
Glucose-Capillary: 116 mg/dL — ABNORMAL HIGH (ref 70–99)

## 2014-08-28 LAB — LACTATE DEHYDROGENASE: LDH: 263 U/L — ABNORMAL HIGH (ref 94–250)

## 2014-08-28 MED ORDER — LANTHANUM CARBONATE 500 MG PO CHEW
1000.0000 mg | CHEWABLE_TABLET | Freq: Three times a day (TID) | ORAL | Status: DC
  Administered 2014-08-28 – 2014-09-14 (×28): 1000 mg via ORAL
  Filled 2014-08-28 (×53): qty 2

## 2014-08-28 MED ORDER — HYDROMORPHONE HCL 1 MG/ML IJ SOLN
INTRAMUSCULAR | Status: AC
  Administered 2014-08-28: 1 mg via INTRAVENOUS
  Filled 2014-08-28: qty 1

## 2014-08-28 MED ORDER — CEFAZOLIN SODIUM-DEXTROSE 2-3 GM-% IV SOLR
2.0000 g | INTRAVENOUS | Status: DC
  Filled 2014-08-28: qty 50

## 2014-08-28 MED ORDER — VITAMIN K1 10 MG/ML IJ SOLN
1.5000 mg | Freq: Once | INTRAVENOUS | Status: AC
  Administered 2014-08-28: 1.5 mg via INTRAVENOUS
  Filled 2014-08-28: qty 0.15

## 2014-08-28 MED ORDER — PHYTONADIONE 5 MG PO TABS
2.5000 mg | ORAL_TABLET | Freq: Once | ORAL | Status: AC
  Administered 2014-08-28: 2.5 mg via ORAL
  Filled 2014-08-28: qty 1

## 2014-08-28 MED ORDER — CEFAZOLIN SODIUM-DEXTROSE 2-3 GM-% IV SOLR
2.0000 g | INTRAVENOUS | Status: DC
  Administered 2014-08-28 – 2014-09-01 (×2): 2 g via INTRAVENOUS
  Filled 2014-08-28 (×5): qty 50

## 2014-08-28 NOTE — Progress Notes (Addendum)
[facility] PROGRESS NOTE  Jadwiga Michaels [id number] DOB: 31-Mar-1956 DOA: 08/07/2014 PCP: Chari Manning, NP    Assumed care of the patient on 08/23/2014 day 16 of her hospital stay    Brief summary:   Patient is a 59 year old African-American female with history of hypertension, diabetes, peripheral neuropathy who presented to [facility] emergency room on 2/22 with severe left shoulder pain. Further workup demonstrated MSSA bacteremia with septic arthritis of numerous joints. Patient underwent a TEE that was negative. Unfortunately continues to have intermittent fever and leukocytosis. ID continues to follow and is directing antibiotic treatment.   Events from 3/1-3/8: [name] renal function continued to worsen. Nephrology was consulted. [name] renal function continued to decline and the patient underwent renal biopsy and HD cath placement. Biopsy is non specific. The patient has since undergone intermittent HD sessions but now looks close to ESRD. The patient also continued to be followed by ID with continuation of Cefazolin, recs for 8 weeks of tx. Patient also noted to be HCV pos.      Subjective:  In bed, denies any headache, no chest abdominal pain, still having left shoulder and left knee pain. Overall feels stable, she has some chronic left-sided weakness but unchanged.   Assessment/Plan:   MSSA bacteremia with sepsis and Polyarticular MSSA septic arthrits: Bilateral knee and shoulder septic arthritis seen by ID and Ortho Dr [name], she underwent irrigation and debridement of Right knee, left knee, Right AC & left AC joints, and Irrigation and debridement of right hand flexor tendon sheath.  Underwent TTE on 2/24 & TEE on 2/29 that was negative.She again underwent R knee arthroscopic irrigation and debridement on 3/5. Leukocytosis stable.   ID recs total of 8 wk of IV antibiotics with cefazolin (renally dosed). Use 2/26 as day 1 of 56 days.        Anemia:secondary to acute illness/CKD/post-op bleeding from the knee along with postbiopsy hematuria -Bleeding  has improved Pt was given 2 units PRBCs on HD on 3/7,  H&H gradually dropping down again due to multiple blood draws and minimal hematuria, we'll transfuse 1 more unit with next dialysis treatment on 08/28/2014 or 08/29/2014.    DM-2: CBG's controlled with SSI. Had stopped Glipizide. A1c 8.4.  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 Currently on temporary dialysis via right IJ dialysis catheter. Post biopsy hematuria improving - monitor.   Foley catheter came out on 08/26/2014, we keep out, bladder scan every 6 hours, last bladder scan shows 56 mL of urine. She has chronic sensation of bladder fullness. Will continue to monitor with every 6 hours bladder scans.    Tobacco Abuse:transdermal nicotine, was counseled    Chronic hepatitis C: Pt is hep C positive. Discussed with ID.  No plans of initiating treatment until after patient's bacteremia is resolved, will follow with ID.    Left-sided facial droop. Likely chronic, I will obtain MRI of the brain with no acute changes on 08/25/2014. On 81 mg of aspirin continue.     Elevated INR - ? Etiology, likely poor Vit K reserve from poor PO status,  doubt DIC, check DIC panel, Vit K PO & IV, monitor INR .      Code Status: Full Family Communication: Pt in room  Disposition Plan: Pending DVT prophylaxis: SCD, will add Heparin once Hematuria has  resolved    Consultants:  Nephrology  ID  CIR  Orthopedic Surgery  IR - initially right IJ non-tunneled dialysis catheter, now scheduled for tunneled dialysis catheter on 08/29/2014    Procedures   TTE  - Left ventricle: The cavity size was normal. Wall thickness wasincreased in a pattern of moderate LVH. Systolic function was normal. The estimated ejection fraction was in the range of 50% to 55%. Wall motion was normal; there were no regional wall motion abnormalities. Features are consistent with a pseudonormal left ventricular filling pattern, with concomitant abnormal relaxation and increased filling pressure (grade 2 diastolic dysfunction). - Aortic valve: There was mild regurgitation.   TEE   - Left ventricle: Systolic function was normal. The estimated ejection fraction was in the range of 55% to 60%. - Aortic valve: No evidence of vegetation. There was moderate regurgitation. - Mitral valve: No evidence of vegetation. - Left atrium: No evidence of thrombus in the atrial cavity or appendage. - Tricuspid valve: No evidence of vegetation.   IR - initially right IJ non-tunneled dialysis catheter, now scheduled for tunneled dialysis catheter      Anti-infectives    Start     Dose/Rate Route Frequency Ordered Stop   08/28/14 1200  ceFAZolin (ANCEF) IVPB 2 g/50 mL premix    Comments:  On call to xray 3/14   2 g 100 mL/hr over 30 Minutes Intravenous On call 08/28/14 0924 08/29/14 1200   08/23/14 2200  fluconazole (DIFLUCAN) tablet 150 mg     150 mg Oral  Once 08/23/14 2151 08/23/14 2252   08/19/14 0500  ceFAZolin (ANCEF) IVPB 2 g/50 mL premix     2 g 100 mL/hr over 30 Minutes Intravenous Every 12 hours 08/18/14 1524     08/18/14 1800  ceFAZolin (ANCEF) IVPB 2 g/50 mL premix  Status:  Discontinued     2 g 100 mL/hr over 30 Minutes Intravenous Every 12 hours 08/18/14 1517 08/18/14 1524   08/18/14 1330  ceFAZolin (ANCEF) 2-3 GM-% IVPB SOLR     Comments:  Barley, Jenny   : cabinet override      08/18/14 1330 08/19/14 0144   08/18/14 0600  ceFAZolin (ANCEF) IVPB 1 g/50 mL premix  Status:  Discontinued     1 g 100 mL/hr over 30 Minutes Intravenous Every 12 hours 08/17/14 1822 08/18/14 1517   08/16/14 1800  ceFAZolin (ANCEF) IVPB 2 g/50 mL premix  Status:  Discontinued     2 g 100 mL/hr over 30 Minutes Intravenous Every 12 hours 08/16/14 1355 08/17/14 1822   08/15/14 1200  nafcillin 2 g in dextrose 5 % 50 mL IVPB  Status:  Discontinued     2 g 100 mL/hr over 30 Minutes Intravenous 6 times per day 08/15/14 1028 08/16/14 1253   08/14/14 1400  ceFAZolin (ANCEF) IVPB 2 g/50 mL premix  Status:  Discontinued     2 g 100 mL/hr over 30 Minutes Intravenous 3 times per day 08/14/14 0850 08/15/14 1028   08/12/14 1800  vancomycin (VANCOCIN) 500 mg in sodium chloride 0.9 % 100 mL IVPB  Status:  Discontinued     500 mg 100 mL/hr over 60 Minutes Intravenous Every 12 hours 08/12/14 0705 08/12/14 0721   08/12/14 1800  vancomycin (  VANCOCIN) IVPB 750 mg/150 ml premix  Status:  Discontinued     750 mg 150 mL/hr over 60 Minutes Intravenous Every 12 hours 08/12/14 0721 08/12/14 1315   08/10/14 2138  vancomycin (VANCOCIN) powder  Status:  Discontinued       As needed 08/10/14 2138 08/10/14 2153   08/10/14 1600  cefTRIAXone (ROCEPHIN) 2 g in dextrose 5 % 50 mL IVPB - Premix  Status:  Discontinued     2 g 100 mL/hr over 30 Minutes Intravenous Every 24 hours 08/09/14 1555 08/10/14 1432   08/10/14 1600  ceFAZolin (ANCEF) IVPB 2 g/50 mL premix  Status:  Discontinued     2 g 100 mL/hr over 30 Minutes Intravenous 3 times per day 08/10/14 1432 08/14/14 0845   08/10/14 0600  vancomycin (VANCOCIN) IVPB 750 mg/150 ml premix  Status:  Discontinued     750 mg 150 mL/hr over 60 Minutes Intravenous Every 12 hours 08/09/14 2018 08/12/14 0705   08/09/14 1800  vancomycin (VANCOCIN) 2,000 mg in sodium chloride 0.9 % 500 mL IVPB     2,000 mg 250 mL/hr over 120  Minutes Intravenous  Once 08/09/14 1616 08/09/14 1929   08/09/14 1630  cefTRIAXone (ROCEPHIN) 2 g in dextrose 5 % 50 mL IVPB - Premix     2 g 100 mL/hr over 30 Minutes Intravenous  Once 08/09/14 1553 08/09/14 1653         Objective: Filed Vitals:   08/28/14 1000 08/28/14 1008 08/28/14 1030 08/28/14 1044  BP: 154/88 102/60 116/79 144/98  Pulse: 88 88 92 91  Temp: 97.8 F (36.6 C) 98.7 F (37.1 C)  98 F (36.7 C)  TempSrc: Oral Oral  Oral  Resp:    18  Height:      Weight:      SpO2:        Intake/Output Summary (Last 24 hours) at 08/28/14 1148 Last data filed at 08/28/14 1044  Gross per 24 hour  Intake    375 ml  Output   3676 ml  Net  -3301 ml   Filed Weights   08/26/14 2150 08/27/14 2053 08/28/14 0633  Weight: 112.129 kg (247 lb 3.2 oz) 112.5 kg (248 lb 0.3 oz) 108.6 kg (239 lb 6.7 oz)    Exam:   General:  Asleep in be, arousable, in nad, chronic left-sided facial droop  Cardiovascular: regular, s1, s2  Respiratory: normal resp effort, no wheezing  Abdomen: soft,nondistended  Musculoskeletal: perfused, B trace LE edema, post-op dressings over R knee, R knee still tender on palpation  Data Reviewed: Basic Metabolic Panel:  Recent Labs Lab 08/24/14 1240 08/25/14 0550 08/26/14 0550 08/27/14 0545 08/28/14 0700  NA 132* 133* 133* 131* 130*  K 3.6 3.6 3.8 3.6 4.0  CL 100 98 98 97 96  CO2 23 22 23 23 28   GLUCOSE 150* 130* 129* 121* 114*  BUN 29* 33* 39* 44* 51*  CREATININE 4.88* 5.59* 6.49* 7.05* 7.62*  CALCIUM 7.4* 7.7* 8.0* 7.8* 7.8*  PHOS 5.1* 5.5* 6.2* 6.7* 7.1*   Liver Function Tests:  Recent Labs Lab 08/24/14 1240 08/25/14 0550 08/26/14 0550 08/27/14 0545 08/28/14 0700  ALBUMIN 1.2* 1.3* 1.2* 1.1* 1.1*   No results for input(s): LIPASE, AMYLASE in the last 168 hours. No results for input(s): AMMONIA in the last 168 hours. CBC:  Recent Labs Lab 08/23/14 0736 08/25/14 0550 08/26/14 0550 08/27/14 0545 08/28/14 0700  WBC 12.2*  11.2* 10.2 9.5 9.9  HGB 8.0* 7.2* 7.5* 7.1* 7.4*  HCT 23.6* 21.5* 22.7* 21.9* 22.6*  MCV 88.7 90.0 90.1 90.1 89.3  PLT 507* 474* 457* 434* 476*   Cardiac Enzymes: No results for input(s): CKTOTAL, CKMB, CKMBINDEX, TROPONINI in the last 168 hours. BNP (last 3 results) No results for input(s): BNP in the last 8760 hours.  ProBNP (last 3 results) No results for input(s): PROBNP in the last 8760 hours.  CBG:  Recent Labs Lab 08/26/14 1641 08/26/14 1749 08/26/14 2147 08/27/14 1722 08/27/14 2052  GLUCAP 149* 120* 135* 131* 116*    Recent Results (from the past 240 hour(s))  Body fluid culture     Status: None   Collection Time: 08/18/14  4:52 PM  Result Value Ref Range Status   Specimen Description FLUID SYNOVIAL LEFT KNEE  Final   Special Requests Normal  Final   Gram Stain   Final    MODERATE WBC PRESENT,BOTH PMN AND MONONUCLEAR NO ORGANISMS SEEN Performed at [facility]    Culture   Final    NO GROWTH 3 DAYS Performed at [facility]    Report Status 08/22/2014 FINAL  Final     Studies:  No results found.  Scheduled Meds: . sodium chloride   Intravenous Once  . antiseptic oral rinse  7 mL Mouth Rinse q12n4p  . aspirin  81 mg Oral Daily  .  ceFAZolin (ANCEF) IV  2 g Intravenous Q12H  .  ceFAZolin (ANCEF) IV  2 g Intravenous On Call  . chlorhexidine  15 mL Mouth Rinse BID  . cyclobenzaprine  5 mg Oral BID  . darbepoetin (ARANESP) injection - NON-DIALYSIS  100 mcg Subcutaneous Once  . famotidine  20 mg Oral Daily  . ferumoxytol  510 mg Intravenous Weekly  . furosemide  120 mg Intravenous BID  . hydrALAZINE  25 mg Oral TID  . insulin aspart  0-9 Units Subcutaneous TID WC  . lanthanum  1,000 mg Oral TID WC  . metoprolol tartrate  50 mg Oral BID  . nicotine  21 mg Transdermal Daily  . polyethylene glycol  17 g Oral BID  . sodium chloride  10-40 mL Intracatheter Q12H  . sodium chloride  3 mL Intravenous Q12H   Continuous Infusions:     Principal Problem:   Staphylococcus aureus bacteremia Active Problems:   Septic joint of left shoulder region   Sinus tachycardia   Septic joint of right knee joint   CKD (chronic kidney disease) stage 3, GFR 30-59 ml/min   Septic joint of right hand   SIRS (systemic inflammatory response syndrome)   Benign essential HTN   Hepatitis C   Acute renal failure   Acute renal failure syndrome   Hep C w/ coma, chronic   Joint infection    SINGH,PRASHANT K  [facility] Pager [phone]. If 7PM-7AM, please contact night-coverage at [email or web address], password [facility] 08/28/2014, 11:48 AM  LOS: 21 days      Assumed care of the patient on 08/23/2014 day 16 of her hospital stay

## 2014-08-28 NOTE — Progress Notes (Signed)
Subjective: Interval History: has complaints rash on chest is itching.  Objective: Vital signs in last 24 hours: Temp:  [97.6 F (36.4 C)-98.7 F (37.1 C)] 98.7 F (37.1 C) (03/14 1008) Pulse Rate:  [84-101] 88 (03/14 1008) Resp:  [18-20] 20 (03/14 0938) BP: (102-162)/(60-90) 102/60 mmHg (03/14 1008) SpO2:  [92 %-97 %] 92 % (03/14 0500) Weight:  [108.6 kg (239 lb 6.7 oz)-112.5 kg (248 lb 0.3 oz)] 108.6 kg (239 lb 6.7 oz) (03/14 [id number]) Weight change: 0.371 kg (13.1 oz)  Intake/Output from previous day: 03/13 0701 - 03/14 0700 In: 40 [I.V.:40] Out: 1 [Urine:1] Intake/Output this shift: Total I/O In: 335 [Blood:335] Out: -   General appearance: alert, cooperative and moderately obese Resp: diminished breath sounds bilaterally Cardio: S1, S2 normal and systolic murmur: holosystolic 2/6, blowing at apex GI: pbese, abdm wall edema, striae,  liver down 5 cm Extremities: edema 3-4+  Lab Results:  Recent Labs  08/27/14 0545 08/28/14 0700  WBC 9.5 9.9  HGB 7.1* 7.4*  HCT 21.9* 22.6*  PLT 434* 476*   BMET:  Recent Labs  08/27/14 0545 08/28/14 0700  NA 131* 130*  K 3.6 4.0  CL 97 96  CO2 23 28  GLUCOSE 121* 114*  BUN 44* 51*  CREATININE 7.05* 7.62*  CALCIUM 7.8* 7.8*   No results for input(s): PTH in the last 72 hours. Iron Studies:  Recent Labs  08/26/14 0550  IRON 28*  TIBC 183*  FERRITIN 546*    Studies/Results: No results found.  I have reviewed the patient's current medications.  Assessment/Plan: 1 AKI Post infx and AIN.  No recovery.  ? Role of rash now.  Vol xs 2 MSSA sepsis and joint infx 3 anemia address, getting blood 4 Hep C 5 HTN 6 Obesity P HD, epo, IV Fe, AB.  Follow rash    LOS: 21 days   Avir Deruiter L 08/28/2014,10:34 AM

## 2014-08-28 NOTE — Progress Notes (Signed)
INR 5.82 after receiving IV vitamin K. Dr. Candiss Norse notified. Patient still needs to receive po vitamin K. Will monitor.  Joellen Jersey, RN.

## 2014-08-28 NOTE — Progress Notes (Signed)
[facility]    Date of Admission:  08/07/2014   Total days of antibiotics 20        Day 14 cefazolin           ID: Brandy Houston is a 59 y.o. female with MSSA disseminated infection with bacteremia, polyarticular septic arthritis. Now complicated by nephrotic syndrome, AKI requiring HD  Principal Problem:   Staphylococcus aureus bacteremia Active Problems:   Septic joint of left shoulder region   Sinus tachycardia   Septic joint of right knee joint   CKD (chronic kidney disease) stage 3, GFR 30-59 ml/min   Septic joint of right hand   SIRS (systemic inflammatory response syndrome)   Benign essential HTN   Hepatitis C   Acute renal failure   Acute renal failure syndrome   Hep C w/ coma, chronic   Joint infection    Subjective: Remains afebrile, unable to get IR placed tunnel catheter due to supratherapeutic inr. She is getting iron infusion presently and tolerating without difficulty.  Medications:  . antiseptic oral rinse  7 mL Mouth Rinse q12n4p  . aspirin  81 mg Oral Daily  .  ceFAZolin (ANCEF) IV  2 g Intravenous Q M,W,F-HD  . chlorhexidine  15 mL Mouth Rinse BID  . cyclobenzaprine  5 mg Oral BID  . darbepoetin (ARANESP) injection - NON-DIALYSIS  100 mcg Subcutaneous Once  . famotidine  20 mg Oral Daily  . ferumoxytol  510 mg Intravenous Weekly  . hydrALAZINE  25 mg Oral TID  . insulin aspart  0-9 Units Subcutaneous TID WC  . lanthanum  1,000 mg Oral TID WC  . metoprolol tartrate  50 mg Oral BID  . nicotine  21 mg Transdermal Daily  . phytonadione  2.5 mg Oral Once  . polyethylene glycol  17 g Oral BID  . sodium chloride  10-40 mL Intracatheter Q12H  . sodium chloride  3 mL Intravenous Q12H    Objective: Vital signs in last 24 hours: Temp:  [97.6 F (36.4 C)-98.7 F (37.1 C)] 98.2 F (36.8 C) (03/14 1352) Pulse Rate:  [84-101] 101 (03/14 1352) Resp:  [18-20] 18 (03/14 1051) BP: (102-162)/(60-98) 153/77 mmHg (03/14 1352) SpO2:   [92 %-97 %] 95 % (03/14 1352) Weight:  [239 lb 6.7 oz (108.6 kg)-248 lb 0.3 oz (112.5 kg)] 239 lb 6.7 oz (108.6 kg) (03/14 [id number])  Physical Exam  Constitutional:  oriented to person, place, and time. appears well-developed and well-nourished. No distress.  HENT:  Mouth/Throat: Oropharynx is clear and moist. No oropharyngeal exudate.  Neck: right ij Cardiovascular: Normal rate, regular rhythm and normal heart sounds. Exam reveals no gallop and no friction rub.  No murmur heard.  Pulmonary/Chest: Effort normal and breath sounds normal. No respiratory distress.  has no wheezes.  Abdominal: Soft. Bowel sounds are normal.  exhibits no distension. There is no tenderness.  Lymphadenopathy: no cervical adenopathy.  Neurological: alert and oriented to person, place, and time.  Skin: Skin is warm and dry. No rash noted. No erythema.  Psychiatric: a normal mood and affect.  behavior is normal.    Lab Results  Recent Labs  08/27/14 0545 08/28/14 0700 08/28/14 1620  WBC 9.5 9.9  --   HGB 7.1* 7.4* 8.7*  HCT 21.9* 22.6* 26.1*  NA 131* 130*  --   K 3.6 4.0  --   CL 97 96  --   CO2 23 28  --   BUN 44* 51*  --  CREATININE 7.05* 7.62*  --    Liver Panel  Recent Labs  08/27/14 0545 08/28/14 0700  ALBUMIN 1.1* 1.1*    Microbiology: 2/25 blood cx mssa 2/26 blood cx NGTD 2/25 AC joint fluid MSSA 3/1 blood cx ngtd 3/4 left knee synovial fluid NGTD Studies/Results: No results found.   Assessment/Plan: no change in plan.  Disseminated MSSA infection = would recommend a total of 8  wk of IV antibiotics with cefazolin (renally dosed). Use 2/26 as day 1 of 56 days. Extended it to 8 weeks due to having heavy disease burden, with polyarticular septic arthritis and bacteremia  Septic arthritis of left knee = no bacteria found in culture but had been on antibiotics prior to arthrocentesis  bilateraly shoulder septic arthritis = underwent I x D which appears to be improving slowly  AKI  on CKD = had previous hx of proteinuria that had not been worked up. Renal bx showed post infectious nephritis. She will need HD for the time being. Nephrology/IR arranging for temporary HD catheter  Anemia = currently getting iron infusion. Will monitor cbc .  HCV = can be a candidate for treatment even with CKD. Will need to follow up on treatment for HCV after finishing treatment for MSSA infection   [name], [facility] Cell: [phone] Pager: [phone]  08/28/2014, 5:54 PM

## 2014-08-28 NOTE — Progress Notes (Signed)
[name], female   DOB: [birth year], 59 y.o.   MRN: [id number]    Was scheduled for temp to poss tunneled HD cath today INR 7.6 today  Plan to recheck INR in am Will recheck and discuss with IR Rad 3/15 am  Keep npo Already consented

## 2014-08-28 NOTE — Progress Notes (Signed)
CRITICAL VALUE ALERT  Critical value received:  Repeat INR 7.61  Date of notification:  08/28/14  Time of notification:  [id number]  Critical value read back:Yes.    Nurse who received alert:  Larena Glassman, RN  MD notified (1st page):  Dr. Candiss Norse  Time of first page:  1341  MD notified (2nd page):  Time of second page:  Responding MD:  Dr. Candiss Norse  Time MD responded:  1344

## 2014-08-28 NOTE — Consult Note (Signed)
Chief Complaint: Chief Complaint  Patient presents with  . Shoulder Pain  continued worsening Cr  Referring Physician(s): Dr Posey Pronto  History of Present Illness: Brandy Houston is a 59 y.o. 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 Diabetes mellitus   . Hypertension   . Peripheral neuropathy     Past Surgical History  Procedure Laterality Date  . Cholecystectomy    . Appendectomy    . Small intestine surgery      Due to Small Bowel Obstruction  . Tubal ligation    . Knee arthroscopy Right 08/10/2014    Procedure: ARTHROSCOPY I & D KNEE;  Surgeon: Marianna Payment, MD;  Location: [facility];  Service: Orthopedics;  Laterality: Right;  . Shoulder arthroscopy Bilateral 08/10/2014    Procedure: I & D BILATERAL SHOULDERS ;  Surgeon: Marianna Payment, MD;  Location: [facility];  Service: Orthopedics;  Laterality: Bilateral;  . Tenosynovectomy Right 08/11/2014    Procedure: RIGHT WRIST IRRIGATION AND DEBRIDEMENT, TENOSYNOVECTOMY;  Surgeon: Marianna Payment, MD;  Location: [place];  Service: Orthopedics;  Laterality: Right;  . Knee arthroscopy Left 08/11/2014    Procedure: ARTHROSCOPIC WASHOUT LEFT KNEE;  Surgeon: Marianna Payment, MD;  Location: [place];  Service: Orthopedics;  Laterality: Left;  . Tee without cardioversion N/A 08/14/2014    Procedure: TRANSESOPHAGEAL ECHOCARDIOGRAM (TEE);  Surgeon: Thayer Headings, MD;  Location: [place];  Service: Cardiovascular;  Laterality: N/A;  . Knee arthroscopy Left 08/19/2014    Procedure: ARTHROSCOPIC WASHOUT LEFT KNEE;  Surgeon: Leandrew Koyanagi, MD;  Location: [place];  Service: Orthopedics;  Laterality: Left;    Allergies: Compazine; Omnipaque;  Shellfish-derived products; and Iodinated diagnostic agents  Medications: Prior to Admission medications   Medication Sig Start Date End Date Taking? Authorizing Provider  aspirin 81 MG chewable tablet Chew 81-162 mg by mouth daily as needed (For stroke prevention.).    Yes Historical Provider, MD  Blood Glucose Monitoring Suppl (ON CALL EXPRESS GLUCOSE METER) DEVI Use as directed 01/25/14  Yes Lance Bosch, NP  glipiZIDE (GLUCOTROL) 10 MG tablet Take 1 tablet (10 mg total) by mouth 2 (two) times daily before a meal. 02/27/14  Yes Lance Bosch, NP  glucose blood test strip Use as instructed 01/25/14  Yes Lance Bosch, NP  hydrALAZINE (APRESOLINE) 25 MG tablet Take 1 tablet (25 mg total) by mouth 3 (three) times daily. 02/27/14  Yes Lance Bosch, NP  Menthol-Methyl Salicylate (MUSCLE RUB) 10-15 % CREA Apply 1 application topically daily as needed for muscle pain.   Yes Historical Provider, MD  TRUEPLUS LANCETS 30G MISC Use as directed 01/25/14  Yes Lance Bosch, NP  aspirin EC 81 MG tablet Take 1 tablet (81 mg total) by mouth daily as needed for pain. 04/14/13   Jonetta Osgood, MD     Family History  Problem Relation Age of Onset  . Cancer Mother   . Heart disease Father   . Cancer Sister   . Cancer Brother   . Diabetes Brother     History   Social History  . Marital Status: Single    Spouse Name: N/A  . Number of Children: N/A  . Years of Education: N/A  Social History Main Topics  . Smoking status: Current Every Day Smoker -- 0.50 packs/day for 20 years  . Smokeless tobacco: Never Used  . Alcohol Use: Yes     Comment: ocassionally  . Drug Use: No  . Sexual Activity: No   Other Topics Concern  . None   Social History Narrative     Review of Systems: A 12 point ROS discussed and pertinent positives are indicated in the HPI above.  All other systems are negative.  Review of Systems  Constitutional: Negative for activity change.  Respiratory: Negative for  chest tightness.   Psychiatric/Behavioral: Negative for behavioral problems and confusion.    Vital Signs: BP 146/83 mmHg  Pulse 86  Temp(Src) 97.7 F (36.5 C) (Oral)  Resp 20  Ht 5' 7.5" (1.715 m)  Wt 108.6 kg (239 lb 6.7 oz)  BMI 36.92 kg/m2  SpO2 92%  LMP 03/16/2002  Physical Exam  Constitutional: She is oriented to person, place, and time. She appears well-nourished.  Cardiovascular: Normal rate and regular rhythm.   No murmur heard. Pulmonary/Chest: Effort normal and breath sounds normal.  Abdominal: Soft. Bowel sounds are normal.  Musculoskeletal: Normal range of motion.  Neurological: She is alert and oriented to person, place, and time.  Skin: Skin is warm and dry.  Psychiatric: She has a normal mood and affect. Her behavior is normal. Judgment and thought content normal.  Nursing note and vitals reviewed.   Mallampati Score:  MD Evaluation Airway: WNL Heart: WNL Abdomen: WNL Chest/ Lungs: WNL ASA  Classification: 3 Mallampati/Airway Score: One  Imaging: Dg Chest 2 View  08/07/2014   CLINICAL DATA:  Left shoulder pain for 5 days. No known injury. Initial encounter.  EXAM: CHEST  2 VIEW  COMPARISON:  None.  FINDINGS: The heart size and mediastinal contours are within normal limits. Both lungs are clear. No pneumothorax or pleural effusion is noted. The visualized skeletal structures are unremarkable.  IMPRESSION: No acute cardiopulmonary abnormality seen.   Electronically Signed   By: Marijo Conception, M.D.   On: 08/07/2014 15:02   Ct Angio Chest Pe W/cm &/or Wo Cm  08/07/2014   CLINICAL DATA:  Tachycardia and hypoxia. Left shoulder pain for 4 days. Renal insufficiency.  EXAM: CT ANGIOGRAPHY CHEST WITH CONTRAST  TECHNIQUE: Multidetector CT imaging of the chest was performed using the standard protocol during bolus administration of intravenous contrast. Multiplanar CT image reconstructions and MIPs were obtained to evaluate the vascular anatomy.  CONTRAST:  183mL  OMNIPAQUE IOHEXOL 350 MG/ML SOLN. The patient was pre-medicated with Benadryl and Solu-Medrol due to a history of hives with Omnipaque.  COMPARISON:  08/07/2014; 10/18/2011  FINDINGS: Mediastinum/Nodes: No filling defect is identified in the pulmonary arterial tree to suggest pulmonary embolus. Mild atherosclerotic calcification of the aortic arch without acute aortic abnormality identified. No pathologic thoracic adenopathy. Mild cardiomegaly.  Lungs/Pleura: Secondary pulmonary lobular interstitial accentuation noted particularly at the lung apices. Mild atelectasis dependently in both lower lobes. No airspace opacity identified.  Upper abdomen: Unremarkable  Musculoskeletal: Mid thoracic spondylosis and mild kyphosis.  Review of the MIP images confirms the above findings.  IMPRESSION: 1. Mild cardiomegaly with secondary pulmonary lobular interstitial accentuation compatible with mild interstitial edema. 2. No embolus identified.   Electronically Signed   By: Van Clines M.D.   On: 08/07/2014 17:25   Mr Brain Wo Contrast  08/25/2014   CLINICAL DATA:  Left-sided facial droop  EXAM: MRI HEAD WITHOUT CONTRAST  TECHNIQUE: Multiplanar, multiecho pulse  sequences of the brain and surrounding structures were obtained without intravenous contrast.  COMPARISON:  None.  FINDINGS: Diffusion imaging does not show any acute or subacute infarction. The brainstem and cerebellum are normal. The cerebral hemispheres show mild chronic small-vessel change of the white matter. No cortical or large vessel territory infarction. No mass lesion, hemorrhage, hydrocephalus or extra-axial collection. No pituitary mass. No inflammatory sinus disease. Major vessels at the base of the brain show flow.  IMPRESSION: No acute or significant finding. Mild chronic small-vessel change of the cerebral hemispheric white matter.   Electronically Signed   By: Nelson Chimes M.D.   On: 08/25/2014 10:20   US Renal  08/15/2014   CLINICAL DATA:   Acute renal failure.  EXAM: RENAL/URINARY TRACT ULTRASOUND COMPLETE  COMPARISON:  None.  FINDINGS: Right Kidney:  Length: 11.6 cm. Increased echogenicity of the parenchyma consistent with renal medical disease. No hydronephrosis or mass lesion or thinning of the renal cortex.  Left Kidney:  Length: 11.9 cm. Increased echogenicity of the renal parenchyma with no thinning of the cortex and no hydronephrosis or mass.  Bladder:  Normal.  Incidental note is made of small bilateral pleural effusions.  IMPRESSION: Increased echogenicity of the renal parenchyma both kidneys consistent with renal medical disease. Small bilateral pleural effusions.   Electronically Signed   By: Lorriane Shire M.D.   On: 08/15/2014 20:08   Mr Hand Right W Wo Contrast  08/11/2014   CLINICAL DATA:  RIGHT hand swelling. Septic arthritis. Previous aspirations and debridement for septic arthritis.  EXAM: MRI OF THE RIGHT HAND WITHOUT AND WITH CONTRAST  TECHNIQUE: Multiplanar, multisequence MR imaging was performed both before and after administration of intravenous contrast.  CONTRAST:  7mL MULTIHANCE GADOBENATE DIMEGLUMINE 529 MG/ML IV SOLN  COMPARISON:  None.  FINDINGS: Edema is present over the dorsum of the hand without abscess. There is flexor and extensor tenosynovitis in the long finger, which may be septic or reactive. The other flexor and extensor compartments appear normal. No deep soft tissue abscess. Periarticular edema is present at third MCP joint however there is no chondrolysis or subchondral marrow edema identified at the third MCP joint. Small third MCP joint effusion. Septic arthritis is considered unlikely based on the lack of secondary findings. The IP joints appear within normal limits. Small cyst or prominent vascular entry zone is present in the radial aspect of the third metacarpal head.  IMPRESSION: Long finger extensor and flexor tenosynovitis, concerning for septic tenosynovitis. Inflammatory changes at the third MCP  joint are likely reactive rather than septic arthritis based on the lack of marrow signal changes and chondrolysis. Cellulitis of the hand.   Electronically Signed   By: Dereck Ligas M.D.   On: 08/11/2014 12:35   Mr Shoulder Left Wo Contrast  08/09/2014   CLINICAL DATA:  Acute onset intractable left shoulder pain 08/03/2014 after are a lifting injury.  EXAM: MRI OF THE LEFT SHOULDER WITHOUT CONTRAST  TECHNIQUE: Multiplanar, multisequence MR imaging of the shoulder was performed. No intravenous contrast was administered.  COMPARISON:  Plain films left shoulder 08/07/2014.  FINDINGS: The study is degraded by patient motion.  Rotator cuff: Supraspinatus worse than infraspinatus tendinopathy without tear is identified.  Muscles:  No atrophy or focal lesion.  Biceps long head:  Intact.  Acromioclavicular Joint: Moderate appearing acromioclavicular osteoarthritis is present. There is fluid within the joint. Intense soft tissue edema is seen about the joint and surrounding fatty and muscular tissues including the deltoid and superior  aspect of the supraspinatus. No focal fluid collection is identified.  Labrum:  Intact.  Bones: As described above. No fracture is identified. The acromion is type 2. There is fluid in the subacromial/subdeltoid bursa.  IMPRESSION: Dominant finding is the abnormal appearance of the acromioclavicular joint highly suspicious for septic joint with surrounding cellulitis and myositis.  Moderate acromioclavicular osteoarthritis.  Mild supraspinatus and infraspinatus tendinopathy without tear.  These results will be called to the ordering clinician or representative by the Radiologist Assistant, and communication documented in the PACS or zVision Dashboard.   Electronically Signed   By: Inge Rise M.D.   On: 08/09/2014 08:48   US Biopsy  08/18/2014   CLINICAL DATA:  Acute renal failure and need for renal biopsy.  EXAM: ULTRASOUND GUIDED CORE BIOPSY OF RIGHT KIDNEY  MEDICATIONS: 1.0 mg  IV Versed; 50 mcg IV Fentanyl  Total Moderate Sedation Time: 13 minutes  PROCEDURE: The procedure, risks, benefits, and alternatives were explained to the patient. Questions regarding the procedure were encouraged and answered. The patient understands and consents to the procedure.  The right posterior flank region was prepped with Betadine in a sterile fashion, and a sterile drape was applied covering the operative field. A sterile gown and sterile gloves were used for the procedure. Local anesthesia was provided with 1% Lidocaine. A time-out was performed prior to the procedure.  Ultrasound was performed of both kidneys from a posterior approach. The right kidney was chosen for sampling. Under ultrasound guidance, 2 separate 16 gauge core biopsy passes were obtained of lower pole cortex. Core biopsy samples were submitted in saline for nephropathologic analysis. Post biopsy imaging was performed with ultrasound.  COMPLICATIONS: None.  FINDINGS: The right renal anatomy was more favorable for biopsy compared to the left. Intact core biopsy samples were obtained. There were no immediate bleeding complications.  IMPRESSION: Ultrasound-guided core biopsy performed of lower pole cortex of the right kidney.   Electronically Signed   By: Aletta Edouard M.D.   On: 08/18/2014 17:11   Ir Fluoro Guide Cv Line Right  08/18/2014   CLINICAL DATA:  Acute renal failure and need for non tunneled hemodialysis catheter.  EXAM: NON-TUNNELED CENTRAL VENOUS CATHETER PLACEMENT WITH ULTRASOUND AND FLUOROSCOPIC GUIDANCE  FLUOROSCOPY TIME:  6 seconds.  PROCEDURE: The procedure, risks, benefits, and alternatives were explained to the patient. Questions regarding the procedure were encouraged and answered. The patient understands and consents to the procedure.  The right neck and chest were prepped with chlorhexidine in a sterile fashion, and a sterile drape was applied covering the operative field. Maximum barrier sterile technique with  sterile gowns and gloves were used for the procedure. Local anesthesia was provided with 1% lidocaine. A time-out was performed prior to the procedure.  After creating a small venotomy incision, a 19 gauge needle was advanced into the right internal jugular vein under direct, real-time ultrasound guidance. Ultrasound image documentation was performed. After securing guidewire access, the venotomy was dilated. A 13 French, 20 cm length Trialysis catheter was then advanced over the wire. Catheter position was confirmed by a fluoroscopic spot image.  The catheter was aspirated, flushed with saline, and injected with appropriate volume heparin dwells. The catheter exit site was secured with 0-Prolene retention sutures.  COMPLICATIONS: None.  No pneumothorax.  FINDINGS: After catheter placement, the tip lies at the cavoatrial junction. The catheter aspirates normally and is ready for immediate use.  IMPRESSION: Placement of non-tunneled central venous dialysis catheter via the right internal jugular vein.  The catheter tip lies at the cavoatrial junction. The catheter is ready for immediate use.   Electronically Signed   By: Aletta Edouard M.D.   On: 08/18/2014 17:02   Ir US Guide Vasc Access Right  08/18/2014   CLINICAL DATA:  Acute renal failure and need for non tunneled hemodialysis catheter.  EXAM: NON-TUNNELED CENTRAL VENOUS CATHETER PLACEMENT WITH ULTRASOUND AND FLUOROSCOPIC GUIDANCE  FLUOROSCOPY TIME:  6 seconds.  PROCEDURE: The procedure, risks, benefits, and alternatives were explained to the patient. Questions regarding the procedure were encouraged and answered. The patient understands and consents to the procedure.  The right neck and chest were prepped with chlorhexidine in a sterile fashion, and a sterile drape was applied covering the operative field. Maximum barrier sterile technique with sterile gowns and gloves were used for the procedure. Local anesthesia was provided with 1% lidocaine. A time-out  was performed prior to the procedure.  After creating a small venotomy incision, a 19 gauge needle was advanced into the right internal jugular vein under direct, real-time ultrasound guidance. Ultrasound image documentation was performed. After securing guidewire access, the venotomy was dilated. A 13 French, 20 cm length Trialysis catheter was then advanced over the wire. Catheter position was confirmed by a fluoroscopic spot image.  The catheter was aspirated, flushed with saline, and injected with appropriate volume heparin dwells. The catheter exit site was secured with 0-Prolene retention sutures.  COMPLICATIONS: None.  No pneumothorax.  FINDINGS: After catheter placement, the tip lies at the cavoatrial junction. The catheter aspirates normally and is ready for immediate use.  IMPRESSION: Placement of non-tunneled central venous dialysis catheter via the right internal jugular vein. The catheter tip lies at the cavoatrial junction. The catheter is ready for immediate use.   Electronically Signed   By: Aletta Edouard M.D.   On: 08/18/2014 17:02   Ir Fluoro Guide Ndl Plmt / Bx  08/10/2014   CLINICAL DATA:  59 year old female with left shoulder pain and evidence on recent MRI of septic arthritis of acromioclavicular joint. She has been referred for evaluation of aspiration.  EXAM: ULTRASOUND AND FLUOROSCOPIC GUIDED LEFT ACROMIOCLAVICULAR JOINT ASPIRATION.  TECHNIQUE: The procedure, risks, benefits, and alternatives were explained to the patient. Questions regarding the procedure were encouraged and answered. The patient understands and consents to the procedure.  Fluoroscopic image of the left shoulder performed. Ultrasound survey of the shoulder performed. Image was stored and sent to PACs with both modality.  Using fluoroscopy, the skin and subcutaneous tissues overlying the left acromioclavicular joint were generously infiltrated 1% lidocaine for local anesthesia.  We then used ultrasound guidance to  advance an 18 gauge needle attached to a syringe into the left acromioclavicular joint fluid. Two separate aspiration were performed. Approximately 1-2 cc of fluid were aspirated.  Samples were sent to the lab for analysis.  Patient tolerated the procedure well and remained hemodynamically stable throughout.  No complications were encountered and no significant blood loss was encountered.  COMPLICATIONS: None  IMPRESSION: Status post fluoroscopic and ultrasound-guided left AC joint aspiration with sample sent to the lab for analysis.  Signed,  Dulcy Fanny. Earleen Newport, DO  [facility]  [facility]   Electronically Signed   By: Corrie Mckusick D.O.   On: 08/10/2014 12:07   Dg Chest Port 1 View  08/22/2014   CLINICAL DATA:  Cough, hypertension, diabetes, smoker  EXAM: PORTABLE CHEST - 1 VIEW  COMPARISON:  Portable exam 0556 hr compared to 08/07/2014  FINDINGS: RIGHT  jugular central venous catheter tip projecting over SVC near cavoatrial junction.  Enlargement of cardiac silhouette with pulmonary vascular congestion.  Peribronchial thickening.  Slight chronic accentuation of interstitial markings similar to previous exam.  Minimal subsegmental atelectasis at RIGHT base.  Atelectasis versus consolidation in LEFT lower lobe with loss of LEFT diaphragmatic silhouette.  No gross pleural effusion or pneumothorax.  IMPRESSION: Enlargement of cardiac silhouette with pulmonary vascular congestion.  Chronic interstitial prominence with minimal RIGHT basilar atelectasis and new atelectasis versus consolidation in LEFT lower lobe.   Electronically Signed   By: Lavonia Dana M.D.   On: 08/22/2014 07:45   Dg Shoulder Left  08/07/2014   CLINICAL DATA:  59 year old female with left shoulder pain after have the lifting injury on 08/03/2014. Initial encounter.  EXAM: LEFT SHOULDER - 2+ VIEW  COMPARISON:  None.  FINDINGS: Bone mineralization is within normal limits for age. No glenohumeral joint  dislocation. Proximal left humerus intact. Left clavicle and scapula appear intact. Negative visible left ribs and lung parenchyma.  IMPRESSION: No acute osseous abnormality identified at the left shoulder.   Electronically Signed   By: Genevie Ann M.D.   On: 08/07/2014 11:11   Dg Abd Portable 1v  08/21/2014   CLINICAL DATA:  Constipation, vomiting since yesterday, initial encounter.  EXAM: PORTABLE ABDOMEN - 1 VIEW  COMPARISON:  None.  FINDINGS: Gas is seen in nondilated small bowel and colon. Scattered stool in the colon. Surgical clips in the right upper quadrant and anatomic pelvis.  IMPRESSION: Normal bowel gas pattern.  No overt evidence of constipation.   Electronically Signed   By: Lorin Picket M.D.   On: 08/21/2014 09:16   US Abdomen Complete W/elastography  08/18/2014   CLINICAL DATA:  Chronic hepatitis C  EXAM: ULTRASOUND ABDOMEN  ULTRASOUND HEPATIC ELASTOGRAPHY  TECHNIQUE: Sonography of the upper abdomen was performed. In addition, ultrasound elastography evaluation of the liver was performed. A region of interest was placed within the right lobe of the liver. Following application of a compressive sonographic pulse, shear waves were detected in the adjacent hepatic tissue and the shear wave velocity was calculated. Multiple assessments were performed at the selected site. Median shear wave velocity is correlated to a Metavir fibrosis score.  COMPARISON:  None.  FINDINGS: ULTRASOUND ABDOMEN  Gallbladder: Surgically absent.  Common bile duct: Diameter: 6 mm  Liver: No focal lesion identified. Within normal limits in parenchymal echogenicity.  IVC: No abnormality visualized.  Pancreas: Incompletely visualized but grossly unremarkable.  Spleen: Size and appearance within normal limits.  Right Kidney: Length: 13.5 cm. Echogenic renal parenchyma, suggesting medical renal disease. No mass or hydronephrosis.  Left Kidney: Length: 12.8 cm. Echogenic renal parenchyma, suggesting medical renal disease. No mass  or hydronephrosis.  Abdominal aorta: No aneurysm visualized.  Other findings: None.  ULTRASOUND HEPATIC ELASTOGRAPHY  Device: Siemens Helix VTQ  Transducer 4V1  Patient position: Supine  Number of measurements:  10  Hepatic Segment:  8  Median velocity:   2.95  m/sec  IQR: 0.51  IQR/Median velocity ratio 0.172  Corresponding Metavir fibrosis score:  F3/F4  Risk of fibrosis: High  Limitations of exam: None  Pertinent findings noted on other imaging exams:  None  Please note that abnormal shear wave velocities may also be identified in clinical settings other than with hepatic fibrosis, such as: acute hepatitis, elevated right heart and central venous pressures including use of beta blockers, veno-occlusive disease (Budd-Chiari), infiltrative processes such as mastocytosis/amyloidosis/infiltrative tumor, extrahepatic cholestasis, in the  post-prandial state, and liver transplantation. Correlation with patient history, laboratory data, and clinical condition recommended.  IMPRESSION: Echogenic renal parenchyma, suggesting medical renal disease.  Status post cholecystectomy.  Median hepatic shear wave velocity is calculated at 2.95 m/sec.  Corresponding Metavir fibrosis score is F3/F4.  Risk of fibrosis is high.  Follow-up:  Follow-up advised.   Electronically Signed   By: Julian Hy M.D.   On: 08/18/2014 16:49    Labs:  CBC:  Recent Labs  08/25/14 0550 08/26/14 0550 08/27/14 0545 08/28/14 0700  WBC 11.2* 10.2 9.5 9.9  HGB 7.2* 7.5* 7.1* 7.4*  HCT 21.5* 22.7* 21.9* 22.6*  PLT 474* 457* 434* 476*    COAGS:  Recent Labs  08/07/14 1603  INR 1.11  APTT 35    BMP:  Recent Labs  08/25/14 0550 08/26/14 0550 08/27/14 0545 08/28/14 0700  NA 133* 133* 131* 130*  K 3.6 3.8 3.6 4.0  CL 98 98 97 96  CO2 22 23 23 28   GLUCOSE 130* 129* 121* 114*  BUN 33* 39* 44* 51*  CALCIUM 7.7* 8.0* 7.8* 7.8*  CREATININE 5.59* 6.49* 7.05* 7.62*  GFRNONAA 8* 6* 6* 5*  GFRAA 9* 7* 7* 6*    LIVER  FUNCTION TESTS:  Recent Labs  12/15/13 1201 02/27/14 1203 08/12/14 0522  08/25/14 0550 08/26/14 0550 08/27/14 0545 08/28/14 0700  BILITOT 0.3 0.3 0.4  --   --   --   --   --   AST 21 17 27   --   --   --   --   --   ALT 55* 40* 16  --   --   --   --   --   ALKPHOS 73 150* 97  --   --   --   --   --   PROT 7.5 7.2 5.7*  --   --   --   --   --   ALBUMIN 3.4* 3.3* 1.1*  < > 1.3* 1.2* 1.1* 1.1*  < > = values in this interval not displayed.  TUMOR MARKERS: No results for input(s): AFPTM, CEA, CA199, CHROMGRNA in the last 8760 hours.  Assessment and Plan:  Worsening renal function Non tunneled HD cath in place---08/18/14 in IR Now scheduled for tunneled catheter placement Risks and Benefits discussed with the patient including, but not limited to bleeding, infection, vascular injury, pneumothorax which may require chest tube placement, air embolism or even death All of the patient's questions were answered, patient is agreeable to proceed. Consent signed and in chart.   Thank you for this interesting consult.  I greatly enjoyed meeting Princella Craige and look forward to participating in their care.  Signed: Baley Shands A 08/28/2014, 9:13 AM   I spent a total of 20 Minutes  in face to face in clinical consultation, greater than 50% of which was counseling/coordinating care for tunneled HD catheter

## 2014-08-28 NOTE — Progress Notes (Signed)
CRITICAL VALUE ALERT  Critical value received:  INR 7.1  Date of notification:  08/28/14  Time of notification:  1233  Critical value read back:Yes.    Nurse who received alert:  Larena Glassman, RN  MD notified (1st page):  Dr. Candiss Norse  Time of first page:  1233  MD notified (2nd page):  Time of second page:  Responding MD:  Dr. Candiss Norse  Time MD responded:  [phone]

## 2014-08-28 NOTE — Procedures (Signed)
I was present at this session.  I have reviewed the session itself and made appropriate changes.  HD via temp cath.  bp ok. Vol xs  Safiyah Cisney L 3/14/201610:33 AM

## 2014-08-28 NOTE — Progress Notes (Signed)
ANTIBIOTIC CONSULT NOTE - FOLLOW UP  Pharmacy Consult for cefazolin Indication: MSSA bacteremia and polyarticular septic arthritis  Allergies  Allergen Reactions  . Compazine [Prochlorperazine] Shortness Of Breath and Swelling    TONGUE SWELLS  . Omnipaque [Iohexol] Hives  . Shellfish-Derived Products Anaphylaxis  . Iodinated Diagnostic Agents Rash    Patient Measurements: Height: 5' 7.5" (171.5 cm) Weight: 239 lb 6.7 oz (108.6 kg) IBW/kg (Calculated) : 62.75  Vital Signs: Temp: 98.7 F (37.1 C) (03/14 1008) Temp Source: Oral (03/14 1008) BP: 116/79 mmHg (03/14 1030) Pulse Rate: 92 (03/14 1030) Intake/Output from previous day: 03/13 0701 - 03/14 0700 In: 40 [I.V.:40] Out: 1 [Urine:1] Intake/Output from this shift: Total I/O In: 335 [Blood:335] Out: -   Labs:  Recent Labs  08/26/14 0550 08/27/14 0545 08/28/14 0700  WBC 10.2 9.5 9.9  HGB 7.5* 7.1* 7.4*  PLT 457* 434* 476*  CREATININE 6.49* 7.05* 7.62*   Estimated Creatinine Clearance: 10.2 mL/min (by C-G formula based on Cr of 7.62). No results for input(s): VANCOTROUGH, VANCOPEAK, VANCORANDOM, GENTTROUGH, GENTPEAK, GENTRANDOM, TOBRATROUGH, TOBRAPEAK, TOBRARND, AMIKACINPEAK, AMIKACINTROU, AMIKACIN in the last 72 hours.   Microbiology: Recent Results (from the past 720 hour(s))  Culture, blood (routine x 2)     Status: None   Collection Time: 08/09/14 12:15 PM  Result Value Ref Range Status   Specimen Description BLOOD RIGHT HAND  Final   Special Requests   Final    BOTTLES DRAWN AEROBIC AND ANAEROBIC 7CCS BOTH BOTTLES   Culture   Final    STAPHYLOCOCCUS AUREUS Note: RIFAMPIN AND GENTAMICIN SHOULD NOT BE USED AS SINGLE DRUGS FOR TREATMENT OF STAPH INFECTIONS. Note: Gram Stain Report Called to,Read Back By and Verified With: Shirlean Kelly 700AM 08/10/14 [place] Performed at [facility]    Report Status 08/12/2014 FINAL  Final   Organism ID, Bacteria STAPHYLOCOCCUS AUREUS  Final   Susceptibility   Staphylococcus aureus - MIC*    CLINDAMYCIN <=0.25 SENSITIVE Sensitive     ERYTHROMYCIN <=0.25 SENSITIVE Sensitive     GENTAMICIN <=0.5 SENSITIVE Sensitive     LEVOFLOXACIN 0.25 SENSITIVE Sensitive     OXACILLIN <=0.25 SENSITIVE Sensitive     PENICILLIN >=0.5 RESISTANT Resistant     RIFAMPIN <=0.5 SENSITIVE Sensitive     TRIMETH/SULFA <=10 SENSITIVE Sensitive     VANCOMYCIN 1 SENSITIVE Sensitive     TETRACYCLINE <=1 SENSITIVE Sensitive     MOXIFLOXACIN <=0.25 SENSITIVE Sensitive     * STAPHYLOCOCCUS AUREUS  Culture, blood (routine x 2)     Status: None   Collection Time: 08/09/14 12:25 PM  Result Value Ref Range Status   Specimen Description BLOOD RIGHT ARM  Final   Special Requests   Final    BOTTLES DRAWN AEROBIC AND ANAEROBIC 10CC BOTH BOTTLES   Culture   Final    STAPHYLOCOCCUS AUREUS Note: SUSCEPTIBILITIES PERFORMED ON PREVIOUS CULTURE WITHIN THE LAST 5 DAYS. Note: Gram Stain Report Called to,Read Back By and Verified With: Shirlean Kelly 700AM 08/10/14 [place] Performed at [facility]    Report Status 08/12/2014 FINAL  Final  Culture, routine-abscess     Status: None   Collection Time: 08/10/14 11:54 AM  Result Value Ref Range Status   Specimen Description SHOULDER LEFT  Final   Special Requests NONE  Final   Gram Stain   Final    ABUNDANT WBC PRESENT, PREDOMINANTLY PMN NO SQUAMOUS EPITHELIAL CELLS SEEN MODERATE GRAM POSITIVE COCCI IN PAIRS IN CLUSTERS Performed at [facility]    Culture   Final    MODERATE STAPHYLOCOCCUS AUREUS Note: RIFAMPIN AND GENTAMICIN SHOULD NOT BE USED AS SINGLE DRUGS FOR TREATMENT OF STAPH INFECTIONS. Performed at [facility]    Report Status 08/13/2014 FINAL  Final   Organism ID, Bacteria STAPHYLOCOCCUS AUREUS  Final      Susceptibility   Staphylococcus aureus - MIC*    CLINDAMYCIN <=0.25 SENSITIVE Sensitive     ERYTHROMYCIN <=0.25 SENSITIVE Sensitive     GENTAMICIN <=0.5 SENSITIVE  Sensitive     LEVOFLOXACIN 0.25 SENSITIVE Sensitive     OXACILLIN 0.5 SENSITIVE Sensitive     PENICILLIN >=0.5 RESISTANT Resistant     RIFAMPIN <=0.5 SENSITIVE Sensitive     TRIMETH/SULFA <=10 SENSITIVE Sensitive     VANCOMYCIN 1 SENSITIVE Sensitive     TETRACYCLINE <=1 SENSITIVE Sensitive     MOXIFLOXACIN <=0.25 SENSITIVE Sensitive     * MODERATE STAPHYLOCOCCUS AUREUS  Anaerobic culture     Status: None   Collection Time: 08/10/14 11:54 AM  Result Value Ref Range Status   Specimen Description SHOULDER LEFT  Final   Special Requests NONE  Final   Gram Stain   Final    MODERATE WBC PRESENT, PREDOMINANTLY PMN NO SQUAMOUS EPITHELIAL CELLS SEEN MODERATE GRAM POSITIVE COCCI IN PAIRS IN CLUSTERS Performed at [facility]    Culture   Final    NO ANAEROBES ISOLATED Note: NO PROPIONIBACTERIUM ISOLATED Performed at [facility]    Report Status 08/25/2014 FINAL  Final  Body fluid culture     Status: None   Collection Time: 08/10/14  5:22 PM  Result Value Ref Range Status   Specimen Description SYNOVIAL JOINT RIGHT KNEE  Final   Special Requests Normal  Final   Gram Stain   Final    ABUNDANT WBC PRESENT,BOTH PMN AND MONONUCLEAR NO ORGANISMS SEEN Performed at [facility]    Culture   Final    FEW STAPHYLOCOCCUS AUREUS Note: RIFAMPIN AND GENTAMICIN SHOULD NOT BE USED AS SINGLE DRUGS FOR TREATMENT OF STAPH INFECTIONS. CRITICAL RESULT CALLED TO, READ BACK BY AND VERIFIED WITH: CINDY FLORES RN 08/13/14 AT 43 AM BY [facility] Performed at [facility]    Report Status 08/14/2014 FINAL  Final   Organism ID, Bacteria STAPHYLOCOCCUS AUREUS  Final      Susceptibility   Staphylococcus aureus - MIC*    CLINDAMYCIN <=0.25 SENSITIVE Sensitive     ERYTHROMYCIN <=0.25 SENSITIVE Sensitive     GENTAMICIN <=0.5 SENSITIVE Sensitive     LEVOFLOXACIN 0.25 SENSITIVE Sensitive     OXACILLIN 0.5 SENSITIVE Sensitive     PENICILLIN >=0.5 RESISTANT Resistant     RIFAMPIN  <=0.5 SENSITIVE Sensitive     TRIMETH/SULFA <=10 SENSITIVE Sensitive     VANCOMYCIN <=0.5 SENSITIVE Sensitive     TETRACYCLINE <=1 SENSITIVE Sensitive     MOXIFLOXACIN <=0.25 SENSITIVE Sensitive     * FEW STAPHYLOCOCCUS AUREUS  Surgical pcr screen     Status: Abnormal   Collection Time: 08/10/14  6:42 PM  Result Value Ref Range Status   MRSA, PCR NEGATIVE NEGATIVE Final   Staphylococcus aureus POSITIVE (A) NEGATIVE Final    Comment:        The Xpert SA Assay (FDA approved for NASAL specimens in patients over 14 years of age), is one component of a comprehensive surveillance program.  Test performance has been validated by [facility] for patients greater than or equal to 52 year old. It  is not intended to diagnose infection nor to guide or monitor treatment.   Body fluid culture     Status: None   Collection Time: 08/10/14  9:14 PM  Result Value Ref Range Status   Specimen Description FLUID LEFT AC JOINT  Final   Special Requests VANCO, ROCEPHIN  Final   Gram Stain   Final    FEW WBC PRESENT, PREDOMINANTLY PMN MODERATE GRAM POSITIVE COCCI IN PAIRS IN CLUSTERS Gram Stain Report Called to,Read Back By and Verified With: Gram Stain Report Called to,Read Back By and Verified With: Laurance Flatten RN 08/11/13 9:10AM BY [place] Performed at [facility]    Culture   Final    ABUNDANT STAPHYLOCOCCUS AUREUS Note: RIFAMPIN AND GENTAMICIN SHOULD NOT BE USED AS SINGLE DRUGS FOR TREATMENT OF STAPH INFECTIONS. Performed at [facility]    Report Status 08/13/2014 FINAL  Final   Organism ID, Bacteria STAPHYLOCOCCUS AUREUS  Final      Susceptibility   Staphylococcus aureus - MIC*    CLINDAMYCIN <=0.25 SENSITIVE Sensitive     ERYTHROMYCIN <=0.25 SENSITIVE Sensitive     GENTAMICIN <=0.5 SENSITIVE Sensitive     LEVOFLOXACIN 0.25 SENSITIVE Sensitive     OXACILLIN <=0.25 SENSITIVE Sensitive     PENICILLIN >=0.5 RESISTANT Resistant     RIFAMPIN <=0.5 SENSITIVE Sensitive      TRIMETH/SULFA <=10 SENSITIVE Sensitive     VANCOMYCIN 1 SENSITIVE Sensitive     TETRACYCLINE <=1 SENSITIVE Sensitive     MOXIFLOXACIN <=0.25 SENSITIVE Sensitive     * ABUNDANT STAPHYLOCOCCUS AUREUS  Anaerobic culture     Status: None   Collection Time: 08/10/14  9:14 PM  Result Value Ref Range Status   Specimen Description FLUID LEFT AC JOINT  Final   Special Requests VANCO, ROCEPHIN  Final   Gram Stain   Final    FEW WBC PRESENT, PREDOMINANTLY PMN MODERATE GRAM POSITIVE COCCI IN PAIRS IN CLUSTERS Performed at [facility]    Culture   Final    NO ANAEROBES ISOLATED Performed at [facility]    Report Status 08/15/2014 FINAL  Final  Culture, blood (routine x 2)     Status: None   Collection Time: 08/11/14  7:00 AM  Result Value Ref Range Status   Specimen Description BLOOD RIGHT ARM  Final   Special Requests BOTTLES DRAWN AEROBIC AND ANAEROBIC 6CC  Final   Culture   Final    NO GROWTH 5 DAYS Performed at [facility]    Report Status 08/17/2014 FINAL  Final  Culture, blood (routine x 2)     Status: None   Collection Time: 08/11/14  7:08 AM  Result Value Ref Range Status   Specimen Description BLOOD LEFT HAND  Final   Special Requests BOTTLES DRAWN AEROBIC AND ANAEROBIC [place]  Final   Culture   Final    NO GROWTH 5 DAYS Performed at [facility]    Report Status 08/17/2014 FINAL  Final  Culture, Urine     Status: None   Collection Time: 08/12/14  5:47 AM  Result Value Ref Range Status   Specimen Description URINE, RANDOM  Final   Special Requests NONE  Final   Colony Count   Final    3,000 COLONIES/ML Performed at [facility]    Culture   Final    INSIGNIFICANT GROWTH Performed at [facility]    Report Status 08/13/2014 FINAL  Final  Culture, blood (routine x  2)     Status: None   Collection Time: 08/15/14  1:15 PM  Result Value Ref Range Status   Specimen Description BLOOD RIGHT HAND  Final   Special Requests  BOTTLES DRAWN AEROBIC ONLY 3CC  Final   Culture   Final    NO GROWTH 5 DAYS Performed at [facility]    Report Status 08/21/2014 FINAL  Final  Culture, blood (routine x 2)     Status: None   Collection Time: 08/15/14  3:11 PM  Result Value Ref Range Status   Specimen Description BLOOD RIGHT HAND  Final   Special Requests   Final    BOTTLES DRAWN AEROBIC AND ANAEROBIC BLUE 10CC RED 5CC   Culture   Final    NO GROWTH 5 DAYS Performed at [facility]    Report Status 08/21/2014 FINAL  Final  Culture, Urine     Status: None   Collection Time: 08/17/14  3:14 PM  Result Value Ref Range Status   Specimen Description URINE, CATHETERIZED  Final   Special Requests NONE  Final   Colony Count NO GROWTH Performed at [facility]   Final   Culture NO GROWTH Performed at [facility]   Final   Report Status 08/18/2014 FINAL  Final  Body fluid culture     Status: None   Collection Time: 08/18/14  4:52 PM  Result Value Ref Range Status   Specimen Description FLUID SYNOVIAL LEFT KNEE  Final   Special Requests Normal  Final   Gram Stain   Final    MODERATE WBC PRESENT,BOTH PMN AND MONONUCLEAR NO ORGANISMS SEEN Performed at [facility]    Culture   Final    NO GROWTH 3 DAYS Performed at [facility]    Report Status 08/22/2014 FINAL  Final    Anti-infectives    Start     Dose/Rate Route Frequency Ordered Stop   08/28/14 0930  ceFAZolin (ANCEF) IVPB 2 g/50 mL premix    Comments:  On call to xray 3/14   2 g 100 mL/hr over 30 Minutes Intravenous On call 08/28/14 0924 08/29/14 0930   08/23/14 2200  fluconazole (DIFLUCAN) tablet 150 mg     150 mg Oral  Once 08/23/14 2151 08/23/14 2252   08/19/14 0500  ceFAZolin (ANCEF) IVPB 2 g/50 mL premix     2 g 100 mL/hr over 30 Minutes Intravenous Every 12 hours 08/18/14 1524     08/18/14 1800  ceFAZolin (ANCEF) IVPB 2 g/50 mL premix  Status:  Discontinued     2 g 100 mL/hr over 30 Minutes  Intravenous Every 12 hours 08/18/14 1517 08/18/14 1524   08/18/14 1330  ceFAZolin (ANCEF) 2-3 GM-% IVPB SOLR    Comments:  Barley, Jenny   : cabinet override      08/18/14 1330 08/19/14 0144   08/18/14 0600  ceFAZolin (ANCEF) IVPB 1 g/50 mL premix  Status:  Discontinued     1 g 100 mL/hr over 30 Minutes Intravenous Every 12 hours 08/17/14 1822 08/18/14 1517   08/16/14 1800  ceFAZolin (ANCEF) IVPB 2 g/50 mL premix  Status:  Discontinued     2 g 100 mL/hr over 30 Minutes Intravenous Every 12 hours 08/16/14 1355 08/17/14 1822   08/15/14 1200  nafcillin 2 g in dextrose 5 % 50 mL IVPB  Status:  Discontinued     2 g 100 mL/hr over 30 Minutes Intravenous 6 times per day 08/15/14 1028  08/16/14 1253   08/14/14 1400  ceFAZolin (ANCEF) IVPB 2 g/50 mL premix  Status:  Discontinued     2 g 100 mL/hr over 30 Minutes Intravenous 3 times per day 08/14/14 0850 08/15/14 1028   08/12/14 1800  vancomycin (VANCOCIN) 500 mg in sodium chloride 0.9 % 100 mL IVPB  Status:  Discontinued     500 mg 100 mL/hr over 60 Minutes Intravenous Every 12 hours 08/12/14 0705 08/12/14 0721   08/12/14 1800  vancomycin (VANCOCIN) IVPB 750 mg/150 ml premix  Status:  Discontinued     750 mg 150 mL/hr over 60 Minutes Intravenous Every 12 hours 08/12/14 0721 08/12/14 1315   08/10/14 2138  vancomycin (VANCOCIN) powder  Status:  Discontinued       As needed 08/10/14 2138 08/10/14 2153   08/10/14 1600  cefTRIAXone (ROCEPHIN) 2 g in dextrose 5 % 50 mL IVPB - Premix  Status:  Discontinued     2 g 100 mL/hr over 30 Minutes Intravenous Every 24 hours 08/09/14 1555 08/10/14 1432   08/10/14 1600  ceFAZolin (ANCEF) IVPB 2 g/50 mL premix  Status:  Discontinued     2 g 100 mL/hr over 30 Minutes Intravenous 3 times per day 08/10/14 1432 08/14/14 0845   08/10/14 0600  vancomycin (VANCOCIN) IVPB 750 mg/150 ml premix  Status:  Discontinued     750 mg 150 mL/hr over 60 Minutes Intravenous Every 12 hours 08/09/14 2018 08/12/14 0705   08/09/14  1800  vancomycin (VANCOCIN) 2,000 mg in sodium chloride 0.9 % 500 mL IVPB     2,000 mg 250 mL/hr over 120 Minutes Intravenous  Once 08/09/14 1616 08/09/14 1929   08/09/14 1630  cefTRIAXone (ROCEPHIN) 2 g in dextrose 5 % 50 mL IVPB - Premix     2 g 100 mL/hr over 30 Minutes Intravenous  Once 08/09/14 1553 08/09/14 1653      Assessment: 2 yoF on cefazolin D#18/56 for MSSA bacteremia and polyarticular septic arthritis. 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 Cefazolin had been renally dose adjusted based on CrCl. Planning now to continue with HD qMWF (schedule not concrete), warranting dose adjustment for HD. WBC 9.9, afebrile.  Goal of Therapy:  Clinical resolution of symptoms  Plan:  -Cefazolin 2 g IV qHD -Follow up HD schedule -Follow up clinical course  Brandy Houston 08/28/2014,10:40 AM

## 2014-08-28 NOTE — Progress Notes (Signed)
Pt received 1 unit of PRBCs; no s/s of a rxn noted; denies SOB, itching or dizziness

## 2014-08-29 ENCOUNTER — Inpatient Hospital Stay (HOSPITAL_COMMUNITY): Payer: Medicaid Other

## 2014-08-29 ENCOUNTER — Encounter (HOSPITAL_COMMUNITY): Payer: Self-pay

## 2014-08-29 LAB — PROTIME-INR
INR: 1.52 — ABNORMAL HIGH (ref 0.00–1.49)
Prothrombin Time: 18.4 seconds — ABNORMAL HIGH (ref 11.6–15.2)

## 2014-08-29 LAB — RENAL FUNCTION PANEL
Albumin: 1.2 g/dL — ABNORMAL LOW (ref 3.5–5.2)
Anion gap: 11 (ref 5–15)
BUN: 28 mg/dL — ABNORMAL HIGH (ref 6–23)
CO2: 25 mmol/L (ref 19–32)
Calcium: 7.5 mg/dL — ABNORMAL LOW (ref 8.4–10.5)
Chloride: 99 mmol/L (ref 96–112)
Creatinine, Ser: 5.26 mg/dL — ABNORMAL HIGH (ref 0.50–1.10)
GFR calc Af Amer: 9 mL/min — ABNORMAL LOW (ref >90)
GFR calc non Af Amer: 8 mL/min — ABNORMAL LOW (ref >90)
Glucose, Bld: 107 mg/dL — ABNORMAL HIGH (ref 70–99)
Phosphorus: 4.7 mg/dL — ABNORMAL HIGH (ref 2.3–4.6)
Potassium: 4.1 mmol/L (ref 3.5–5.1)
Sodium: 135 mmol/L (ref 135–145)

## 2014-08-29 LAB — GLUCOSE, CAPILLARY
Glucose-Capillary: 88 mg/dL (ref 70–99)
Glucose-Capillary: 95 mg/dL (ref 70–99)
Glucose-Capillary: 124 mg/dL — ABNORMAL HIGH (ref 70–99)
Glucose-Capillary: 107 mg/dL — ABNORMAL HIGH (ref 70–99)

## 2014-08-29 LAB — HAPTOGLOBIN: Haptoglobin: 145 mg/dL (ref 34–200)

## 2014-08-29 MED ORDER — MIDAZOLAM HCL 2 MG/2ML IJ SOLN
INTRAMUSCULAR | Status: AC
  Filled 2014-08-29: qty 4

## 2014-08-29 MED ORDER — HEPARIN SODIUM (PORCINE) 5000 UNIT/ML IJ SOLN
5000.0000 [IU] | Freq: Three times a day (TID) | INTRAMUSCULAR | Status: DC
  Administered 2014-08-29 – 2014-09-13 (×32): 5000 [IU] via SUBCUTANEOUS
  Filled 2014-08-29 (×51): qty 1

## 2014-08-29 MED ORDER — CEFAZOLIN SODIUM-DEXTROSE 2-3 GM-% IV SOLR
2.0000 g | Freq: Once | INTRAVENOUS | Status: AC
  Administered 2014-08-29: 2 g via INTRAVENOUS

## 2014-08-29 MED ORDER — LIDOCAINE-EPINEPHRINE (PF) 1 %-1:200000 IJ SOLN
INTRAMUSCULAR | Status: AC
  Filled 2014-08-29: qty 10

## 2014-08-29 MED ORDER — FENTANYL CITRATE 0.05 MG/ML IJ SOLN
INTRAMUSCULAR | Status: AC | PRN
  Administered 2014-08-29 (×2): 50 ug via INTRAVENOUS

## 2014-08-29 MED ORDER — FENTANYL CITRATE 0.05 MG/ML IJ SOLN
INTRAMUSCULAR | Status: AC
  Filled 2014-08-29: qty 4

## 2014-08-29 MED ORDER — PROMETHAZINE HCL 25 MG/ML IJ SOLN: 25.0000 mg | Freq: Once | INTRAMUSCULAR | Status: DC

## 2014-08-29 MED ORDER — SODIUM CHLORIDE 0.9 % IJ SOLN
10.0000 mL | Freq: Two times a day (BID) | INTRAMUSCULAR | Status: DC
  Administered 2014-08-30 – 2014-09-13 (×7): 10 mL

## 2014-08-29 MED ORDER — CEFAZOLIN SODIUM-DEXTROSE 2-3 GM-% IV SOLR
INTRAVENOUS | Status: AC
  Filled 2014-08-29: qty 50

## 2014-08-29 MED ORDER — MIDAZOLAM HCL 2 MG/2ML IJ SOLN
INTRAMUSCULAR | Status: AC | PRN
  Administered 2014-08-29 (×3): 1 mg via INTRAVENOUS

## 2014-08-29 MED ORDER — HEPARIN SODIUM (PORCINE) 1000 UNIT/ML IJ SOLN
INTRAMUSCULAR | Status: AC
  Filled 2014-08-29: qty 1

## 2014-08-29 MED ORDER — SODIUM CHLORIDE 0.9 % IJ SOLN
10.0000 mL | INTRAMUSCULAR | Status: DC | PRN
  Administered 2014-08-29 – 2014-08-31 (×3): 10 mL
  Administered 2014-08-31: 20 mL
  Administered 2014-09-02 – 2014-09-04 (×4): 10 mL
  Administered 2014-09-04: 20 mL
  Administered 2014-09-08 – 2014-09-13 (×10): 10 mL
  Filled 2014-08-29 (×19): qty 40

## 2014-08-29 MED ORDER — CHLORHEXIDINE GLUCONATE 4 % EX LIQD
CUTANEOUS | Status: AC
  Filled 2014-08-29: qty 15

## 2014-08-29 NOTE — Progress Notes (Signed)
Informed Dr. Moshe Cipro that a midline catheter was placed in patient's left IJ during her IR procedure.   Joellen Jersey, RN.

## 2014-08-29 NOTE — Clinical Social Work Note (Signed)
CSW continuing to follow patient's medical progress in anticipation of SNF placement at discharge. CSW will continue to follow and assist with discharge to a skilled facility when medically stable.   Ailie Gage Givens, MSW, LCSW Licensed Clinical Social Worker [place] [phone]

## 2014-08-29 NOTE — Progress Notes (Signed)
UR Completed.  [phone]  

## 2014-08-29 NOTE — Progress Notes (Signed)
Patient received IV zofran at 1734 and stated she wouldn't be able to eat anything she received for dinner (meat loaf, green beans, rice). Ordered soup and jello for patient. At [place] patient was noted to be vomiting the jello she attempted to eat. Dr. Candiss Norse notified. New orders received for abd xray and phenergan. Will monitor.  Joellen Jersey, RN.

## 2014-08-29 NOTE — Sedation Documentation (Signed)
Ancef 2GM IV given pre-procedure

## 2014-08-29 NOTE — Procedures (Signed)
Successful placement of right IJ approach dual  lumen PICC line with tip at the superior caval-atrial junction.   Successful fluoroscopic guided conversion of existing right jugular approach temporary dialysis catheter to a permanent tunneled HD catheter.   Both catheters are ready for immediate use.   No immediate post procedural complications.

## 2014-08-29 NOTE — Sedation Documentation (Signed)
R IJ placed- will proceed with HD cath now

## 2014-08-29 NOTE — Progress Notes (Signed)
Physical Therapy Treatment Patient Details Name: Brandy Houston MRN: [id number] DOB: Aug 17, 1955 Today's Date: 08/29/2014    History of Present Illness MSSA bacteremia with Polyarticular septic arthrits  s/p I&D of BUE's AC joint, BLE knee joint, and right hand    PT Comments    Pt progressing towards physical therapy goals. Pt was able to ambulate a short distance this session (8 feet) with RW and +2 assist for balance and support. Pt will require follow-up therapy at d/c for continued strengthening and to improve tolerance for functional activity.   Follow Up Recommendations  SNF     Equipment Recommendations  None recommended by PT    Recommendations for Other Services       Precautions / Restrictions Precautions Precautions: Fall Restrictions Weight Bearing Restrictions: Yes RLE Weight Bearing: Weight bearing as tolerated LLE Weight Bearing: Weight bearing as tolerated    Mobility  Bed Mobility Overal bed mobility: Needs Assistance Bed Mobility: Supine to Sit     Supine to sit: Min assist     General bed mobility comments: Pt reaching for therapist's hand to pull up to sitting, however needed min guard assist to sit up. Use of bed rails for support.   Transfers Overall transfer level: Needs assistance Equipment used: Rolling walker (2 wheeled) Transfers: Sit to/from Stand Sit to Stand: Min assist;+2 physical assistance         General transfer comment: Pt was able to power-up to full standing with +2 assist. Increased time required to gain standing balance - LLE buckling and pt losing balance posteriorly.   Ambulation/Gait Ambulation/Gait assistance: Min assist;+2 safety/equipment;+2 physical assistance Ambulation Distance (Feet): 8 Feet Assistive device: Rolling walker (2 wheeled) Gait Pattern/deviations: Step-to pattern;Decreased stride length;Trunk flexed Gait velocity: Decreased Gait velocity interpretation: Below normal speed for age/gender General Gait  Details: Pt initially took side steps at EOB and then ambulated about 5 feet forwards. Chair was pulled up behind pt as she could no tolerate any further ambulation. +2 assist to steady pt with each step, gradually requiring less assist and time to recover.    Stairs            Wheelchair Mobility    Modified Rankin (Stroke Patients Only)       Balance                                    Cognition Arousal/Alertness: Awake/alert Behavior During Therapy: WFL for tasks assessed/performed Overall Cognitive Status: Within Functional Limits for tasks assessed                      Exercises      General Comments        Pertinent Vitals/Pain Pain Assessment: Faces Faces Pain Scale: Hurts even more Pain Location: knees Pain Descriptors / Indicators: Grimacing;Guarding Pain Intervention(s): Limited activity within patient's tolerance;Monitored during session;Repositioned;Patient requesting pain meds-RN notified    Home Living                      Prior Function            PT Goals (current goals can now be found in the care plan section) Acute Rehab PT Goals Patient Stated Goal: go home, feel better PT Goal Formulation: With patient Time For Goal Achievement: 08/27/14 Potential to Achieve Goals: Good Progress towards PT goals: Progressing toward goals    Frequency  Min 2X/week    PT Plan Current plan remains appropriate;Frequency needs to be updated    Co-evaluation             End of Session Equipment Utilized During Treatment: Gait belt Activity Tolerance: Patient limited by pain;Patient limited by fatigue Patient left: in chair;with call bell/phone within reach     Time: 1342-1406 PT Time Calculation (min) (ACUTE ONLY): 24 min  Charges:  $Gait Training: 8-22 mins $Therapeutic Activity: 8-22 mins                    G Codes:      Rolinda Roan 16-Sep-2014, 2:27 PM   Rolinda Roan, PT, DPT Acute Rehabilitation  Services Pager: [phone]

## 2014-08-29 NOTE — Progress Notes (Signed)
ANTIBIOTIC CONSULT NOTE - FOLLOW UP  Pharmacy Consult for Cefazolin Indication: disseminated MSSA  Allergies  Allergen Reactions   Compazine [Prochlorperazine] Shortness Of Breath and Swelling    TONGUE SWELLS   Omnipaque [Iohexol] Hives   Shellfish-Derived Products Anaphylaxis   Iodinated Diagnostic Agents Rash    Patient Measurements: Height: 5' 7.5" (171.5 cm) Weight: 239 lb 6.7 oz (108.6 kg) IBW/kg (Calculated) : 62.75 Adjusted Body Weight:   Vital Signs: Temp: 98.6 F (37 C) (03/15 1109) Temp Source: Oral (03/15 1109) BP: 161/82 mmHg (03/15 1109) Pulse Rate: 89 (03/15 1109) Intake/Output from previous day: 03/14 0701 - 03/15 0700 In: 692 [P.O.:120; I.V.:20; Blood:335; IV Piggyback:217] Out: 3675  Intake/Output from this shift:    Labs:  Recent Labs  08/27/14 0545 08/28/14 0700 08/28/14 1620 08/29/14 0415  WBC 9.5 9.9  --   --   HGB 7.1* 7.4* 8.7*  --   PLT 434* 476*  --   --   CREATININE 7.05* 7.62*  --  5.26*   Estimated Creatinine Clearance: 14.7 mL/min (by C-G formula based on Cr of 5.26). No results for input(s): VANCOTROUGH, VANCOPEAK, VANCORANDOM, GENTTROUGH, GENTPEAK, GENTRANDOM, TOBRATROUGH, TOBRAPEAK, TOBRARND, AMIKACINPEAK, AMIKACINTROU, AMIKACIN in the last 72 hours.   Microbiology: Recent Results (from the past 720 hour(s))  Culture, blood (routine x 2)     Status: None   Collection Time: 08/09/14 12:15 PM  Result Value Ref Range Status   Specimen Description BLOOD RIGHT HAND  Final   Special Requests   Final    BOTTLES DRAWN AEROBIC AND ANAEROBIC 7CCS BOTH BOTTLES   Culture   Final    STAPHYLOCOCCUS AUREUS Note: RIFAMPIN AND GENTAMICIN SHOULD NOT BE USED AS SINGLE DRUGS FOR TREATMENT OF STAPH INFECTIONS. Note: Gram Stain Report Called to,Read Back By and Verified With: Shirlean Kelly 700AM 08/10/14 [place] Performed at [facility]    Report Status 08/12/2014 FINAL  Final   Organism ID, Bacteria STAPHYLOCOCCUS AUREUS   Final      Susceptibility   Staphylococcus aureus - MIC*    CLINDAMYCIN <=0.25 SENSITIVE Sensitive     ERYTHROMYCIN <=0.25 SENSITIVE Sensitive     GENTAMICIN <=0.5 SENSITIVE Sensitive     LEVOFLOXACIN 0.25 SENSITIVE Sensitive     OXACILLIN <=0.25 SENSITIVE Sensitive     PENICILLIN >=0.5 RESISTANT Resistant     RIFAMPIN <=0.5 SENSITIVE Sensitive     TRIMETH/SULFA <=10 SENSITIVE Sensitive     VANCOMYCIN 1 SENSITIVE Sensitive     TETRACYCLINE <=1 SENSITIVE Sensitive     MOXIFLOXACIN <=0.25 SENSITIVE Sensitive     * STAPHYLOCOCCUS AUREUS  Culture, blood (routine x 2)     Status: None   Collection Time: 08/09/14 12:25 PM  Result Value Ref Range Status   Specimen Description BLOOD RIGHT ARM  Final   Special Requests   Final    BOTTLES DRAWN AEROBIC AND ANAEROBIC 10CC BOTH BOTTLES   Culture   Final    STAPHYLOCOCCUS AUREUS Note: SUSCEPTIBILITIES PERFORMED ON PREVIOUS CULTURE WITHIN THE LAST 5 DAYS. Note: Gram Stain Report Called to,Read Back By and Verified With: Shirlean Kelly 700AM 08/10/14 [place] Performed at [facility]    Report Status 08/12/2014 FINAL  Final  Culture, routine-abscess     Status: None   Collection Time: 08/10/14 11:54 AM  Result Value Ref Range Status   Specimen Description SHOULDER LEFT  Final   Special Requests NONE  Final   Gram Stain   Final    ABUNDANT WBC  PRESENT, PREDOMINANTLY PMN NO SQUAMOUS EPITHELIAL CELLS SEEN MODERATE GRAM POSITIVE COCCI IN PAIRS IN CLUSTERS Performed at [facility]    Culture   Final    MODERATE STAPHYLOCOCCUS AUREUS Note: RIFAMPIN AND GENTAMICIN SHOULD NOT BE USED AS SINGLE DRUGS FOR TREATMENT OF STAPH INFECTIONS. Performed at [facility]    Report Status 08/13/2014 FINAL  Final   Organism ID, Bacteria STAPHYLOCOCCUS AUREUS  Final      Susceptibility   Staphylococcus aureus - MIC*    CLINDAMYCIN <=0.25 SENSITIVE Sensitive     ERYTHROMYCIN <=0.25 SENSITIVE Sensitive     GENTAMICIN <=0.5  SENSITIVE Sensitive     LEVOFLOXACIN 0.25 SENSITIVE Sensitive     OXACILLIN 0.5 SENSITIVE Sensitive     PENICILLIN >=0.5 RESISTANT Resistant     RIFAMPIN <=0.5 SENSITIVE Sensitive     TRIMETH/SULFA <=10 SENSITIVE Sensitive     VANCOMYCIN 1 SENSITIVE Sensitive     TETRACYCLINE <=1 SENSITIVE Sensitive     MOXIFLOXACIN <=0.25 SENSITIVE Sensitive     * MODERATE STAPHYLOCOCCUS AUREUS  Anaerobic culture     Status: None   Collection Time: 08/10/14 11:54 AM  Result Value Ref Range Status   Specimen Description SHOULDER LEFT  Final   Special Requests NONE  Final   Gram Stain   Final    MODERATE WBC PRESENT, PREDOMINANTLY PMN NO SQUAMOUS EPITHELIAL CELLS SEEN MODERATE GRAM POSITIVE COCCI IN PAIRS IN CLUSTERS Performed at [facility]    Culture   Final    NO ANAEROBES ISOLATED Note: NO PROPIONIBACTERIUM ISOLATED Performed at [facility]    Report Status 08/25/2014 FINAL  Final  Body fluid culture     Status: None   Collection Time: 08/10/14  5:22 PM  Result Value Ref Range Status   Specimen Description SYNOVIAL JOINT RIGHT KNEE  Final   Special Requests Normal  Final   Gram Stain   Final    ABUNDANT WBC PRESENT,BOTH PMN AND MONONUCLEAR NO ORGANISMS SEEN Performed at [facility]    Culture   Final    FEW STAPHYLOCOCCUS AUREUS Note: RIFAMPIN AND GENTAMICIN SHOULD NOT BE USED AS SINGLE DRUGS FOR TREATMENT OF STAPH INFECTIONS. CRITICAL RESULT CALLED TO, READ BACK BY AND VERIFIED WITH: CINDY FLORES RN 08/13/14 AT 17 AM BY [facility] Performed at [facility]    Report Status 08/14/2014 FINAL  Final   Organism ID, Bacteria STAPHYLOCOCCUS AUREUS  Final      Susceptibility   Staphylococcus aureus - MIC*    CLINDAMYCIN <=0.25 SENSITIVE Sensitive     ERYTHROMYCIN <=0.25 SENSITIVE Sensitive     GENTAMICIN <=0.5 SENSITIVE Sensitive     LEVOFLOXACIN 0.25 SENSITIVE Sensitive     OXACILLIN 0.5 SENSITIVE Sensitive     PENICILLIN >=0.5 RESISTANT Resistant      RIFAMPIN <=0.5 SENSITIVE Sensitive     TRIMETH/SULFA <=10 SENSITIVE Sensitive     VANCOMYCIN <=0.5 SENSITIVE Sensitive     TETRACYCLINE <=1 SENSITIVE Sensitive     MOXIFLOXACIN <=0.25 SENSITIVE Sensitive     * FEW STAPHYLOCOCCUS AUREUS  Surgical pcr screen     Status: Abnormal   Collection Time: 08/10/14  6:42 PM  Result Value Ref Range Status   MRSA, PCR NEGATIVE NEGATIVE Final   Staphylococcus aureus POSITIVE (A) NEGATIVE Final    Comment:        The Xpert SA Assay (FDA approved for NASAL specimens in patients over 11 years of age), is one component of a comprehensive surveillance program.  Test performance has been validated by [facility] for patients greater than or equal to 60 year old. It is not intended to diagnose infection nor to guide or monitor treatment.   Body fluid culture     Status: None   Collection Time: 08/10/14  9:14 PM  Result Value Ref Range Status   Specimen Description FLUID LEFT AC JOINT  Final   Special Requests VANCO, ROCEPHIN  Final   Gram Stain   Final    FEW WBC PRESENT, PREDOMINANTLY PMN MODERATE GRAM POSITIVE COCCI IN PAIRS IN CLUSTERS Gram Stain Report Called to,Read Back By and Verified With: Gram Stain Report Called to,Read Back By and Verified With: Laurance Flatten RN 08/11/13 9:10AM BY [place] Performed at [facility]    Culture   Final    ABUNDANT STAPHYLOCOCCUS AUREUS Note: RIFAMPIN AND GENTAMICIN SHOULD NOT BE USED AS SINGLE DRUGS FOR TREATMENT OF STAPH INFECTIONS. Performed at [facility]    Report Status 08/13/2014 FINAL  Final   Organism ID, Bacteria STAPHYLOCOCCUS AUREUS  Final      Susceptibility   Staphylococcus aureus - MIC*    CLINDAMYCIN <=0.25 SENSITIVE Sensitive     ERYTHROMYCIN <=0.25 SENSITIVE Sensitive     GENTAMICIN <=0.5 SENSITIVE Sensitive     LEVOFLOXACIN 0.25 SENSITIVE Sensitive     OXACILLIN <=0.25 SENSITIVE Sensitive     PENICILLIN >=0.5 RESISTANT Resistant     RIFAMPIN <=0.5 SENSITIVE  Sensitive     TRIMETH/SULFA <=10 SENSITIVE Sensitive     VANCOMYCIN 1 SENSITIVE Sensitive     TETRACYCLINE <=1 SENSITIVE Sensitive     MOXIFLOXACIN <=0.25 SENSITIVE Sensitive     * ABUNDANT STAPHYLOCOCCUS AUREUS  Anaerobic culture     Status: None   Collection Time: 08/10/14  9:14 PM  Result Value Ref Range Status   Specimen Description FLUID LEFT AC JOINT  Final   Special Requests VANCO, ROCEPHIN  Final   Gram Stain   Final    FEW WBC PRESENT, PREDOMINANTLY PMN MODERATE GRAM POSITIVE COCCI IN PAIRS IN CLUSTERS Performed at [facility]    Culture   Final    NO ANAEROBES ISOLATED Performed at [facility]    Report Status 08/15/2014 FINAL  Final  Culture, blood (routine x 2)     Status: None   Collection Time: 08/11/14  7:00 AM  Result Value Ref Range Status   Specimen Description BLOOD RIGHT ARM  Final   Special Requests BOTTLES DRAWN AEROBIC AND ANAEROBIC 6CC  Final   Culture   Final    NO GROWTH 5 DAYS Performed at [facility]    Report Status 08/17/2014 FINAL  Final  Culture, blood (routine x 2)     Status: None   Collection Time: 08/11/14  7:08 AM  Result Value Ref Range Status   Specimen Description BLOOD LEFT HAND  Final   Special Requests BOTTLES DRAWN AEROBIC AND ANAEROBIC [place]  Final   Culture   Final    NO GROWTH 5 DAYS Performed at [facility]    Report Status 08/17/2014 FINAL  Final  Culture, Urine     Status: None   Collection Time: 08/12/14  5:47 AM  Result Value Ref Range Status   Specimen Description URINE, RANDOM  Final   Special Requests NONE  Final   Colony Count   Final    3,000 COLONIES/ML Performed at [facility]    Culture   Final    INSIGNIFICANT GROWTH  Performed at [facility]    Report Status 08/13/2014 FINAL  Final  Culture, blood (routine x 2)     Status: None   Collection Time: 08/15/14  1:15 PM  Result Value Ref Range Status   Specimen Description BLOOD RIGHT HAND  Final    Special Requests BOTTLES DRAWN AEROBIC ONLY 3CC  Final   Culture   Final    NO GROWTH 5 DAYS Performed at [facility]    Report Status 08/21/2014 FINAL  Final  Culture, blood (routine x 2)     Status: None   Collection Time: 08/15/14  3:11 PM  Result Value Ref Range Status   Specimen Description BLOOD RIGHT HAND  Final   Special Requests   Final    BOTTLES DRAWN AEROBIC AND ANAEROBIC BLUE 10CC RED 5CC   Culture   Final    NO GROWTH 5 DAYS Performed at [facility]    Report Status 08/21/2014 FINAL  Final  Culture, Urine     Status: None   Collection Time: 08/17/14  3:14 PM  Result Value Ref Range Status   Specimen Description URINE, CATHETERIZED  Final   Special Requests NONE  Final   Colony Count NO GROWTH Performed at [facility]   Final   Culture NO GROWTH Performed at [facility]   Final   Report Status 08/18/2014 FINAL  Final  Body fluid culture     Status: None   Collection Time: 08/18/14  4:52 PM  Result Value Ref Range Status   Specimen Description FLUID SYNOVIAL LEFT KNEE  Final   Special Requests Normal  Final   Gram Stain   Final    MODERATE WBC PRESENT,BOTH PMN AND MONONUCLEAR NO ORGANISMS SEEN Performed at [facility]    Culture   Final    NO GROWTH 3 DAYS Performed at [facility]    Report Status 08/22/2014 FINAL  Final    Anti-infectives    Start     Dose/Rate Route Frequency Ordered Stop   08/29/14 0900  ceFAZolin (ANCEF) IVPB 2 g/50 mL premix     2 g 100 mL/hr over 30 Minutes Intravenous  Once 08/29/14 0854 08/29/14 0925   08/29/14 0802  ceFAZolin (ANCEF) 2-3 GM-% IVPB SOLR  Status:  Discontinued    Comments:  Laughlin, Amy   : cabinet override      08/29/14 0802 08/29/14 0815   08/28/14 2200  ceFAZolin (ANCEF) IVPB 2 g/50 mL premix     2 g 100 mL/hr over 30 Minutes Intravenous Every M-W-F (Hemodialysis) 08/28/14 1501     08/28/14 1200  ceFAZolin (ANCEF) IVPB 2 g/50 mL premix  Status:   Discontinued    Comments:  On call to xray 3/14   2 g 100 mL/hr over 30 Minutes Intravenous On call 08/28/14 0924 08/28/14 1446   08/23/14 2200  fluconazole (DIFLUCAN) tablet 150 mg     150 mg Oral  Once 08/23/14 2151 08/23/14 2252   08/19/14 0500  ceFAZolin (ANCEF) IVPB 2 g/50 mL premix  Status:  Discontinued     2 g 100 mL/hr over 30 Minutes Intravenous Every 12 hours 08/18/14 1524 08/28/14 1501   08/18/14 1800  ceFAZolin (ANCEF) IVPB 2 g/50 mL premix  Status:  Discontinued     2 g 100 mL/hr over 30 Minutes Intravenous Every 12 hours 08/18/14 1517 08/18/14 1524   08/18/14 1330  ceFAZolin (ANCEF) 2-3 GM-% IVPB SOLR    Comments:  Barley, Jenny   : cabinet override      08/18/14 1330 08/19/14 0144   08/18/14 0600  ceFAZolin (ANCEF) IVPB 1 g/50 mL premix  Status:  Discontinued     1 g 100 mL/hr over 30 Minutes Intravenous Every 12 hours 08/17/14 1822 08/18/14 1517   08/16/14 1800  ceFAZolin (ANCEF) IVPB 2 g/50 mL premix  Status:  Discontinued     2 g 100 mL/hr over 30 Minutes Intravenous Every 12 hours 08/16/14 1355 08/17/14 1822   08/15/14 1200  nafcillin 2 g in dextrose 5 % 50 mL IVPB  Status:  Discontinued     2 g 100 mL/hr over 30 Minutes Intravenous 6 times per day 08/15/14 1028 08/16/14 1253   08/14/14 1400  ceFAZolin (ANCEF) IVPB 2 g/50 mL premix  Status:  Discontinued     2 g 100 mL/hr over 30 Minutes Intravenous 3 times per day 08/14/14 0850 08/15/14 1028   08/12/14 1800  vancomycin (VANCOCIN) 500 mg in sodium chloride 0.9 % 100 mL IVPB  Status:  Discontinued     500 mg 100 mL/hr over 60 Minutes Intravenous Every 12 hours 08/12/14 0705 08/12/14 0721   08/12/14 1800  vancomycin (VANCOCIN) IVPB 750 mg/150 ml premix  Status:  Discontinued     750 mg 150 mL/hr over 60 Minutes Intravenous Every 12 hours 08/12/14 0721 08/12/14 1315   08/10/14 2138  vancomycin (VANCOCIN) powder  Status:  Discontinued       As needed 08/10/14 2138 08/10/14 2153   08/10/14 1600  cefTRIAXone  (ROCEPHIN) 2 g in dextrose 5 % 50 mL IVPB - Premix  Status:  Discontinued     2 g 100 mL/hr over 30 Minutes Intravenous Every 24 hours 08/09/14 1555 08/10/14 1432   08/10/14 1600  ceFAZolin (ANCEF) IVPB 2 g/50 mL premix  Status:  Discontinued     2 g 100 mL/hr over 30 Minutes Intravenous 3 times per day 08/10/14 1432 08/14/14 0845   08/10/14 0600  vancomycin (VANCOCIN) IVPB 750 mg/150 ml premix  Status:  Discontinued     750 mg 150 mL/hr over 60 Minutes Intravenous Every 12 hours 08/09/14 2018 08/12/14 0705   08/09/14 1800  vancomycin (VANCOCIN) 2,000 mg in sodium chloride 0.9 % 500 mL IVPB     2,000 mg 250 mL/hr over 120 Minutes Intravenous  Once 08/09/14 1616 08/09/14 1929   08/09/14 1630  cefTRIAXone (ROCEPHIN) 2 g in dextrose 5 % 50 mL IVPB - Premix     2 g 100 mL/hr over 30 Minutes Intravenous  Once 08/09/14 1553 08/09/14 1653      Assessment: 59yo female with 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  She is to have total of 56 days of antibiotics with 08/11/13 representing day#1 per ID team.  Currently receiving Cefazolin 2g IV qHD, with no HD ordered for 3/16 though expected if remains anuric today.  Pt received a dose after HD on 3/14 and another dose this AM prior to PICC & tunneled HD cath placement.  Cr 5.26 and lytes wnl s/p HD 3/14.  Goal of Therapy:  Treatment of infection  Plan:  D/C current order for Ancef 2g IV qHD, f/u plans for HD on 3/16 Order Ancef as needed with HD  Gracy Bruins, PharmD Clinical Pharmacist [facility]

## 2014-08-29 NOTE — Progress Notes (Signed)
[facility] PROGRESS NOTE  Brandy Houston BHA:[id number] DOB: [birth year] DOA: 08/07/2014 PCP: Chari Manning, NP    Assumed care of the patient on 08/23/2014 day 16 of her hospital stay    Brief summary:   Patient is a 59 year old African-American female with history of hypertension, diabetes, peripheral neuropathy who presented to [place] emergency room on 2/22 with severe left shoulder pain. Further workup demonstrated MSSA bacteremia with septic arthritis of numerous joints. Patient underwent a TEE that was negative. Unfortunately continues to have intermittent fever and leukocytosis. ID continues to follow and is directing antibiotic treatment.   Events from 3/1-3/8: [name] renal function continued to worsen. Nephrology was consulted. [name] renal function continued to decline and the patient underwent renal biopsy and HD cath placement. Biopsy is non specific. The patient has since undergone intermittent HD sessions but now looks close to ESRD. The patient also continued to be followed by ID with continuation of Cefazolin, recs for 8 weeks of tx. Patient also noted to be HCV pos.      Subjective:  In bed, denies any headache, no chest abdominal pain, still having left shoulder and left knee pain. Overall feels stable, she has some chronic left-sided weakness but unchanged.   Assessment/Plan:   MSSA bacteremia with sepsis and Polyarticular MSSA septic arthrits: Bilateral knee and shoulder septic arthritis seen by ID and Ortho Dr [name], she underwent irrigation and debridement of Right knee, left knee, Right AC & left AC joints, and Irrigation and debridement of right hand flexor tendon sheath.  Underwent TTE on 2/24 & TEE on 2/29 that was negative.She again underwent R knee arthroscopic irrigation and debridement on 3/5 by Dr [name]. Leukocytosis stable.     ID recs total of 8 wk of IV antibiotics with cefazolin (renally dosed). Use 2/26 as day 1 of 56 days.        Anemia:secondary to acute illness/CKD/post-op bleeding from the knee along with postbiopsy hematuria -Bleeding  has improved Pt was given 2 units PRBCs on HD on 3/7,  H&H gradually dropping down again due to multiple blood draws and minimal hematuria, we'll transfuse 1 more unit with next dialysis treatment on 08/28/2014.    DM-2: CBG's controlled with SSI. Had stopped Glipizide. A1c 8.4.  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 Currently on temporary dialysis via right tunneled IJ dialysis catheter. Renal following, ? Close to ESRD, Post biopsy hematuria resolved.   Foley catheter came out on 08/26/2014, we keep out, bladder scan every 6 hours, last bladder scan shows 56 mL of urine. She has chronic sensation of bladder fullness. Will continue to monitor with every 6 hours bladder scans.    Tobacco Abuse:transdermal nicotine, was counseled    Chronic hepatitis C: Pt is hep C positive. Discussed with ID.  No plans of initiating treatment until after patient's bacteremia is resolved, will follow with ID.    Left-sided facial droop. Likely chronic, I will obtain MRI of the brain with no acute changes on 08/25/2014. On 81 mg of aspirin continue.     Elevated INR - ? Etiology, likely poor Vit K reserve from poor PO  status, liver enzymes stable, she ruled out DIC , INR stable after vitamin K was supplemented orally and IV on 08/28/2014.      Code Status: Full Family Communication: Pt in room  Disposition Plan: Pending DVT prophylaxis: SCD, restart Heparin 08-29-14 as hematuria seems to have  resolved.    Consultants:  Nephrology  ID  CIR  Orthopedic Surgery  IR - initially right IJ non-tunneled dialysis catheter, now R.IJ tunneled dialysis catheter on 08/29/2014 along with L IJ Mid Line.    Procedures   TTE  - Left ventricle: The cavity size was normal. Wall thickness wasincreased in a pattern of moderate LVH. Systolic function was normal. The estimated ejection fraction was in the range of 50% to 55%. Wall motion was normal; there were no regional wall motion abnormalities. Features are consistent with a pseudonormal left ventricular filling pattern, with concomitant abnormal relaxation and increased filling pressure (grade 2 diastolic dysfunction). - Aortic valve: There was mild regurgitation.   TEE   - Left ventricle: Systolic function was normal. The estimated ejection fraction was in the range of 55% to 60%. - Aortic valve: No evidence of vegetation. There was moderate regurgitation. - Mitral valve: No evidence of vegetation. - Left atrium: No evidence of thrombus in the atrial cavity or appendage. - Tricuspid valve: No evidence of vegetation.   IR - initially right IJ non-tunneled dialysis catheter, now scheduled for tunneled dialysis catheter      Anti-infectives    Start     Dose/Rate Route Frequency Ordered Stop   08/29/14 0900  ceFAZolin (ANCEF) IVPB 2 g/50 mL premix     2 g 100 mL/hr over 30 Minutes Intravenous  Once 08/29/14 0854 08/29/14 0925   08/29/14 0802  ceFAZolin (ANCEF) 2-3 GM-% IVPB SOLR  Status:  Discontinued    Comments:  Laughlin, Amy   : cabinet override      08/29/14 0802 08/29/14 0815   08/28/14 2200  ceFAZolin (ANCEF) IVPB 2 g/50 mL premix     2 g 100 mL/hr over 30 Minutes Intravenous Every M-W-F (Hemodialysis) 08/28/14 1501     08/28/14 1200  ceFAZolin (ANCEF) IVPB 2 g/50 mL premix  Status:  Discontinued    Comments:  On call to xray 3/14   2 g 100 mL/hr over 30 Minutes Intravenous On call 08/28/14 0924  08/28/14 1446   08/23/14 2200  fluconazole (DIFLUCAN) tablet 150 mg     150 mg Oral  Once 08/23/14 2151 08/23/14 2252   08/19/14 0500  ceFAZolin (ANCEF) IVPB 2 g/50 mL premix  Status:  Discontinued     2 g 100 mL/hr over 30 Minutes Intravenous Every 12 hours 08/18/14 1524 08/28/14 1501   08/18/14 1800  ceFAZolin (ANCEF) IVPB 2 g/50 mL premix  Status:  Discontinued     2 g 100 mL/hr over 30 Minutes Intravenous Every 12 hours 08/18/14 1517 08/18/14 1524   08/18/14 1330  ceFAZolin (ANCEF) 2-3 GM-% IVPB SOLR    Comments:  Barley, Jenny   : cabinet override      08/18/14 1330 08/19/14 0144   08/18/14 0600  ceFAZolin (ANCEF) IVPB 1 g/50 mL premix  Status:  Discontinued     1 g 100 mL/hr over 30 Minutes Intravenous Every 12 hours 08/17/14 1822 08/18/14 1517   08/16/14 1800  ceFAZolin (ANCEF) IVPB 2 g/50 mL premix  Status:  Discontinued     2 g 100 mL/hr over 30 Minutes Intravenous Every 12 hours 08/16/14 1355 08/17/14 1822  08/15/14 1200  nafcillin 2 g in dextrose 5 % 50 mL IVPB  Status:  Discontinued     2 g 100 mL/hr over 30 Minutes Intravenous 6 times per day 08/15/14 1028 08/16/14 1253   08/14/14 1400  ceFAZolin (ANCEF) IVPB 2 g/50 mL premix  Status:  Discontinued     2 g 100 mL/hr over 30 Minutes Intravenous 3 times per day 08/14/14 0850 08/15/14 1028   08/12/14 1800  vancomycin (VANCOCIN) 500 mg in sodium chloride 0.9 % 100 mL IVPB  Status:  Discontinued     500 mg 100 mL/hr over 60 Minutes Intravenous Every 12 hours 08/12/14 0705 08/12/14 0721   08/12/14 1800  vancomycin (VANCOCIN) IVPB 750 mg/150 ml premix  Status:  Discontinued     750 mg 150 mL/hr over 60 Minutes Intravenous Every 12 hours 08/12/14 0721 08/12/14 1315   08/10/14 2138  vancomycin (VANCOCIN) powder  Status:  Discontinued       As needed 08/10/14 2138 08/10/14 2153   08/10/14 1600  cefTRIAXone (ROCEPHIN) 2 g in dextrose 5 % 50 mL IVPB - Premix  Status:  Discontinued     2 g 100 mL/hr over 30 Minutes Intravenous  Every 24 hours 08/09/14 1555 08/10/14 1432   08/10/14 1600  ceFAZolin (ANCEF) IVPB 2 g/50 mL premix  Status:  Discontinued     2 g 100 mL/hr over 30 Minutes Intravenous 3 times per day 08/10/14 1432 08/14/14 0845   08/10/14 0600  vancomycin (VANCOCIN) IVPB 750 mg/150 ml premix  Status:  Discontinued     750 mg 150 mL/hr over 60 Minutes Intravenous Every 12 hours 08/09/14 2018 08/12/14 0705   08/09/14 1800  vancomycin (VANCOCIN) 2,000 mg in sodium chloride 0.9 % 500 mL IVPB     2,000 mg 250 mL/hr over 120 Minutes Intravenous  Once 08/09/14 1616 08/09/14 1929   08/09/14 1630  cefTRIAXone (ROCEPHIN) 2 g in dextrose 5 % 50 mL IVPB - Premix     2 g 100 mL/hr over 30 Minutes Intravenous  Once 08/09/14 1553 08/09/14 1653         Objective: Filed Vitals:   08/29/14 0931 08/29/14 0934 08/29/14 1010 08/29/14 1035  BP: 145/83 137/67 142/70 161/82  Pulse: 91 86 86 93  Temp:   98.1 F (36.7 C) 98.6 F (37 C)  TempSrc:   Oral Oral  Resp: _0 Height:      Weight:      SpO2: 100% 98% 98% 98%    Intake/Output Summary (Last 24 hours) at 08/29/14 1113 Last data filed at 08/29/14 0414  Gross per 24 hour  Intake    357 ml  Output      0 ml  Net    357 ml   Filed Weights   08/26/14 2150 08/27/14 2053 08/28/14 0633  Weight: 112.129 kg (247 lb 3.2 oz) 112.5 kg (248 lb 0.3 oz) 108.6 kg (239 lb 6.7 oz)    Exam:   General:  Asleep in be, arousable, in nad, chronic left-sided facial droop  Cardiovascular: regular, s1, s2  Respiratory: normal resp effort, no wheezing  Abdomen: soft,nondistended  Musculoskeletal: perfused, B trace LE edema, post-op dressings over R knee, L knee still tender on palpation  Data Reviewed: Basic Metabolic Panel:  Recent Labs Lab 08/25/14 0550 08/26/14 0550 08/27/14 0545 08/28/14 0700 08/29/14 0415  NA 133* 133* 131* 130* 135  K 3.6 3.8 3.6 4.0 4.1  CL 98 98 97 96 99  CO2 _0 GLUCOSE 130* 129* 121* 114* 107*  BUN 33* 39*  44* 51* 28*  CREATININE 5.59* 6.49* 7.05* 7.62* 5.26*  CALCIUM 7.7* 8.0* 7.8* 7.8* 7.5*  PHOS 5.5* 6.2* 6.7* 7.1* 4.7*   Liver Function Tests:  Recent Labs Lab 08/26/14 0550 08/27/14 0545 08/28/14 0700 08/28/14 1953 08/29/14 0415  AST  --   --   --  12  --   ALT  --   --   --  <5  --   ALKPHOS  --   --   --  100  --   BILITOT  --   --   --  0.7  --   PROT  --   --   --  6.7  --   ALBUMIN 1.2* 1.1* 1.1* 1.2* 1.2*   No results for input(s): LIPASE, AMYLASE in the last 168 hours. No results for input(s): AMMONIA in the last 168 hours. CBC:  Recent Labs Lab 08/23/14 0736 08/25/14 0550 08/26/14 0550 08/27/14 0545 08/28/14 0700 08/28/14 1620  WBC 12.2* 11.2* 10.2 9.5 9.9  --   HGB 8.0* 7.2* 7.5* 7.1* 7.4* 8.7*  HCT 23.6* 21.5* 22.7* 21.9* 22.6* 26.1*  MCV 88.7 90.0 90.1 90.1 89.3  --   PLT 507* 474* 457* 434* 476*  --    Cardiac Enzymes: No results for input(s): CKTOTAL, CKMB, CKMBINDEX, TROPONINI in the last 168 hours. BNP (last 3 results) No results for input(s): BNP in the last 8760 hours.  ProBNP (last 3 results) No results for input(s): PROBNP in the last 8760 hours.  CBG:  Recent Labs Lab 08/28/14 1129 08/28/14 1356 08/28/14 1657 08/28/14 2132 08/29/14 0746  GLUCAP 87 92 137* 116* 88    No results found for this or any previous visit (from the past 240 hour(s)).   Studies:  Ir Fluoro Guide Cv Line Left  08/29/2014   INDICATION: End-stage renal disease - in need of conversion of temporary right internal jugular approach dialysis catheter to a tunneled/permanent dialysis catheter for continuation of dialysis.  Patient is admitted to the hospital and in need of durable intravenous access for medication administration and blood draws. As such, given patient's history of end-stage renal disease, request made for placement of a left internal jugular approach PICC line.  EXAM: 1. CONVERSION OF EXISTING RIGHT INTERNAL JUGULAR APPROACH TEMPORARY DIALYSIS  CATHETER TO A TUNNELED CENTRAL VENOUS HEMODIALYSIS CATHETER PLACEMENT WITH FLUOROSCOPIC GUIDANCE 2. ULTRASOUND GUIDANCE FOR VENOUS ACCESS 3. ULTRASOUND FLUOROSCOPIC GUIDED PLACEMENT OF A LEFT INTERNAL JUGULAR APPROACH PICC LINE  MEDICATIONS: Ancef 2 gm IV; The IV antibiotic was given in an appropriate time interval prior to skin puncture.  CONTRAST:  None  ANESTHESIA/SEDATION: Versed 3 mg IV; Fentanyl 100 mcg IV  Total Moderate Sedation Time  40 minutes.  FLUOROSCOPY TIME:  48 seconds (11.9 mGy)  COMPLICATIONS: None immediate  PROCEDURE: Informed written consent was obtained from the patient after a discussion of the risks, benefits, and alternatives to treatment. Questions regarding the procedure were encouraged and answered.  Initially, attention was paid towards acquisition of a left internal jugular approach PICC line. As such, the left neck and chest were prepped with chlorhexidine in a sterile fashion, and a sterile drape was applied covering the operative field. Maximum barrier sterile technique with sterile gowns and gloves were used for the procedure. A timeout was performed prior to the initiation of the procedure.  After creating a small venotomy incision, a micropuncture kit was utilized  to access the left internal jugular vein under direct, real-time ultrasound guidance after the overlying soft tissues were anesthetized with 1% lidocaine with epinephrine. Ultrasound image documentation was performed. The microwire was kinked to measure appropriate catheter length. Ultimately, a 19 cm dual lumen PICC line was advanced through the peel-away sheath with tip terminating within the superior aspect of the right atrium. A post placement spot fluoroscopic image was obtained. The catheter was secured at the exit site with an interrupted suture.  Attention was now paid towards conversion of the existing temporary right internal jugular approach dialysis catheter to a permanent/tunneled dialysis catheter. The  existing temporary dialysis catheter was used for measurement purposes. A HemoSplit tunneled hemodialysis catheter measuring 23 cm from tip to cuff was tunneled in a retrograde fashion from the anterior chest wall to the venotomy incision.  A stiff Glidewire was cannulated within in the temporary dialysis catheter advanced the level of the IVC. Under intermittent fluoroscopic guidance, the existing temporary dialysis catheter was exchanged for a peel-away sheath. The tunneled hemodialysis catheter was then placed through the peel-away sheath with tips ultimately positioned within the superior aspect of the right atrium. Final catheter positioning was confirmed and documented with a spot radiographic image. The catheter aspirates and flushes normally. The catheter was flushed with appropriate volume heparin dwells.  The catheter exit site was secured with a 0-Prolene retention suture. The venotomy incision was closed with an interrupted 4-0 Vicryl, Dermabond and Steri-strips.  Dressings were applied. The patient tolerated the above procedures well without immediate post procedural complication.  IMPRESSION: 1. Successful conversion of a 23 cm tip to cuff tunneled hemodialysis catheter with tips terminating within the superior aspect of the right atrium. The catheter is ready for immediate use. 2. Successful placement of a left internal jugular approach dual lumen PICC line with tip terminating within the superior aspect of the right atrium. The catheter is ready for immediate use.   Electronically Signed   By: Sandi Mariscal M.D.   On: 08/29/2014 11:02   Ir Fluoro Guide Cv Line Right  08/29/2014   INDICATION: End-stage renal disease - in need of conversion of temporary right internal jugular approach dialysis catheter to a tunneled/permanent dialysis catheter for continuation of dialysis.  Patient is admitted to the hospital and in need of durable intravenous access for medication administration and blood draws. As  such, given patient's history of end-stage renal disease, request made for placement of a left internal jugular approach PICC line.  EXAM: 1. CONVERSION OF EXISTING RIGHT INTERNAL JUGULAR APPROACH TEMPORARY DIALYSIS CATHETER TO A TUNNELED CENTRAL VENOUS HEMODIALYSIS CATHETER PLACEMENT WITH FLUOROSCOPIC GUIDANCE 2. ULTRASOUND GUIDANCE FOR VENOUS ACCESS 3. ULTRASOUND FLUOROSCOPIC GUIDED PLACEMENT OF A LEFT INTERNAL JUGULAR APPROACH PICC LINE  MEDICATIONS: Ancef 2 gm IV; The IV antibiotic was given in an appropriate time interval prior to skin puncture.  CONTRAST:  None  ANESTHESIA/SEDATION: Versed 3 mg IV; Fentanyl 100 mcg IV  Total Moderate Sedation Time  40 minutes.  FLUOROSCOPY TIME:  48 seconds (68.3 mGy)  COMPLICATIONS: None immediate  PROCEDURE: Informed written consent was obtained from the patient after a discussion of the risks, benefits, and alternatives to treatment. Questions regarding the procedure were encouraged and answered.  Initially, attention was paid towards acquisition of a left internal jugular approach PICC line. As such, the left neck and chest were prepped with chlorhexidine in a sterile fashion, and a sterile drape was applied covering the operative field. Maximum barrier sterile technique with sterile gowns  and gloves were used for the procedure. A timeout was performed prior to the initiation of the procedure.  After creating a small venotomy incision, a micropuncture kit was utilized to access the left internal jugular vein under direct, real-time ultrasound guidance after the overlying soft tissues were anesthetized with 1% lidocaine with epinephrine. Ultrasound image documentation was performed. The microwire was kinked to measure appropriate catheter length. Ultimately, a 19 cm dual lumen PICC line was advanced through the peel-away sheath with tip terminating within the superior aspect of the right atrium. A post placement spot fluoroscopic image was obtained. The catheter was secured  at the exit site with an interrupted suture.  Attention was now paid towards conversion of the existing temporary right internal jugular approach dialysis catheter to a permanent/tunneled dialysis catheter. The existing temporary dialysis catheter was used for measurement purposes. A HemoSplit tunneled hemodialysis catheter measuring 23 cm from tip to cuff was tunneled in a retrograde fashion from the anterior chest wall to the venotomy incision.  A stiff Glidewire was cannulated within in the temporary dialysis catheter advanced the level of the IVC. Under intermittent fluoroscopic guidance, the existing temporary dialysis catheter was exchanged for a peel-away sheath. The tunneled hemodialysis catheter was then placed through the peel-away sheath with tips ultimately positioned within the superior aspect of the right atrium. Final catheter positioning was confirmed and documented with a spot radiographic image. The catheter aspirates and flushes normally. The catheter was flushed with appropriate volume heparin dwells.  The catheter exit site was secured with a 0-Prolene retention suture. The venotomy incision was closed with an interrupted 4-0 Vicryl, Dermabond and Steri-strips.  Dressings were applied. The patient tolerated the above procedures well without immediate post procedural complication.  IMPRESSION: 1. Successful conversion of a 23 cm tip to cuff tunneled hemodialysis catheter with tips terminating within the superior aspect of the right atrium. The catheter is ready for immediate use. 2. Successful placement of a left internal jugular approach dual lumen PICC line with tip terminating within the superior aspect of the right atrium. The catheter is ready for immediate use.   Electronically Signed   By: Sandi Mariscal M.D.   On: 08/29/2014 11:02   Ir US Guide Vasc Access Right  08/29/2014   INDICATION: End-stage renal disease - in need of conversion of temporary right internal jugular approach dialysis  catheter to a tunneled/permanent dialysis catheter for continuation of dialysis.  Patient is admitted to the hospital and in need of durable intravenous access for medication administration and blood draws. As such, given patient's history of end-stage renal disease, request made for placement of a left internal jugular approach PICC line.  EXAM: 1. CONVERSION OF EXISTING RIGHT INTERNAL JUGULAR APPROACH TEMPORARY DIALYSIS CATHETER TO A TUNNELED CENTRAL VENOUS HEMODIALYSIS CATHETER PLACEMENT WITH FLUOROSCOPIC GUIDANCE 2. ULTRASOUND GUIDANCE FOR VENOUS ACCESS 3. ULTRASOUND FLUOROSCOPIC GUIDED PLACEMENT OF A LEFT INTERNAL JUGULAR APPROACH PICC LINE  MEDICATIONS: Ancef 2 gm IV; The IV antibiotic was given in an appropriate time interval prior to skin puncture.  CONTRAST:  None  ANESTHESIA/SEDATION: Versed 3 mg IV; Fentanyl 100 mcg IV  Total Moderate Sedation Time  40 minutes.  FLUOROSCOPY TIME:  48 seconds (28.4 mGy)  COMPLICATIONS: None immediate  PROCEDURE: Informed written consent was obtained from the patient after a discussion of the risks, benefits, and alternatives to treatment. Questions regarding the procedure were encouraged and answered.  Initially, attention was paid towards acquisition of a left internal jugular approach PICC line. As such,  the left neck and chest were prepped with chlorhexidine in a sterile fashion, and a sterile drape was applied covering the operative field. Maximum barrier sterile technique with sterile gowns and gloves were used for the procedure. A timeout was performed prior to the initiation of the procedure.  After creating a small venotomy incision, a micropuncture kit was utilized to access the left internal jugular vein under direct, real-time ultrasound guidance after the overlying soft tissues were anesthetized with 1% lidocaine with epinephrine. Ultrasound image documentation was performed. The microwire was kinked to measure appropriate catheter length. Ultimately, a 19 cm  dual lumen PICC line was advanced through the peel-away sheath with tip terminating within the superior aspect of the right atrium. A post placement spot fluoroscopic image was obtained. The catheter was secured at the exit site with an interrupted suture.  Attention was now paid towards conversion of the existing temporary right internal jugular approach dialysis catheter to a permanent/tunneled dialysis catheter. The existing temporary dialysis catheter was used for measurement purposes. A HemoSplit tunneled hemodialysis catheter measuring 23 cm from tip to cuff was tunneled in a retrograde fashion from the anterior chest wall to the venotomy incision.  A stiff Glidewire was cannulated within in the temporary dialysis catheter advanced the level of the IVC. Under intermittent fluoroscopic guidance, the existing temporary dialysis catheter was exchanged for a peel-away sheath. The tunneled hemodialysis catheter was then placed through the peel-away sheath with tips ultimately positioned within the superior aspect of the right atrium. Final catheter positioning was confirmed and documented with a spot radiographic image. The catheter aspirates and flushes normally. The catheter was flushed with appropriate volume heparin dwells.  The catheter exit site was secured with a 0-Prolene retention suture. The venotomy incision was closed with an interrupted 4-0 Vicryl, Dermabond and Steri-strips.  Dressings were applied. The patient tolerated the above procedures well without immediate post procedural complication.  IMPRESSION: 1. Successful conversion of a 23 cm tip to cuff tunneled hemodialysis catheter with tips terminating within the superior aspect of the right atrium. The catheter is ready for immediate use. 2. Successful placement of a left internal jugular approach dual lumen PICC line with tip terminating within the superior aspect of the right atrium. The catheter is ready for immediate use.   Electronically  Signed   By: Sandi Mariscal M.D.   On: 08/29/2014 11:02    Scheduled Meds: . antiseptic oral rinse  7 mL Mouth Rinse q12n4p  . aspirin  81 mg Oral Daily  .  ceFAZolin (ANCEF) IV  2 g Intravenous Q M,W,F-HD  . chlorhexidine      . chlorhexidine  15 mL Mouth Rinse BID  . cyclobenzaprine  5 mg Oral BID  . famotidine  20 mg Oral Daily  . fentaNYL      . ferumoxytol  510 mg Intravenous Weekly  . heparin      . hydrALAZINE  25 mg Oral TID  . insulin aspart  0-9 Units Subcutaneous TID WC  . lanthanum  1,000 mg Oral TID WC  . lidocaine-EPINEPHrine      . metoprolol tartrate  50 mg Oral BID  . midazolam      . nicotine  21 mg Transdermal Daily  . polyethylene glycol  17 g Oral BID  . sodium chloride  10-40 mL Intracatheter Q12H  . sodium chloride  3 mL Intravenous Q12H   Continuous Infusions:    Principal Problem:   Staphylococcus aureus bacteremia Active Problems:  Septic joint of left shoulder region   Sinus tachycardia   Septic joint of right knee joint   CKD (chronic kidney disease) stage 3, GFR 30-59 ml/min   Septic joint of right hand   SIRS (systemic inflammatory response syndrome)   Benign essential HTN   Hepatitis C   Acute renal failure   Acute renal failure syndrome   Hep C w/ coma, chronic   Joint infection    , K  [facility] Pager [phone]. If 7PM-7AM, please contact night-coverage at [email or web address], password [facility] 08/29/2014, 11:13 AM  LOS: 22 days      Assumed care of the patient on 08/23/2014 day 16 of her hospital stay

## 2014-08-29 NOTE — Progress Notes (Signed)
[name], female   DOB: 04-Nov-1955, 59 y.o.   MRN: [id number]  [facility] Progress Note    Assessment/ Plan:   1. 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 Continue supportive management/treatment of underlying infection as we monitor for renal recovery. Unfortunately, marginal urine output with continued volume overload and continued (albeit slow) rise of creatinine. Had HD on 3/9 and then needed on 3/14 via temp HD cath- now in for 11 days Consult interventional radiology to convert temporary dialysis catheter to tunneled dialysis catheter- to happen today?  Watch and wait strategy with daily labs- no scheduled HD tomorrow yet.  If no UOP will likely need  2. MSSA bacteremia with septic arthritis - without underlying source of infection (negative TEE).Continue antibiotic therapy- ancef.  3. ABLA on anemia of chronic disease- monitor Hgb trend for PRBC needs. s/p ESA on Monday- will re-dose. With iron deficiency-start intravenous iron therapy to augment ongoing ESA. 4. Hep C + with >8 million copies by PCR, ID following, cryoglobulin assay negative 5. HTN- stable and anticipate to improve with diuresis/ultrafiltration and hemodialysis- took 3600 off yest with HD 6. Hyponatremia- related to AKI and edema.Continue fluid restriction and monitor with dialysis- improved with HD yesterday 7. DM- historically poorly controlled. Per primary svc 8. Rash- not as symptomatic  Subjective:   Reports that she is in discomfort from pedal edema/pain in her joints.    Objective:   BP 161/82 mmHg  Pulse 93  Temp(Src) 98.6 F (37 C) (Oral)  Resp 18  Ht 5' 7.5" (1.715 m)  Wt 108.6 kg (239 lb 6.7 oz)  BMI 36.92 kg/m2  SpO2 98%  LMP 03/16/2002  Intake/Output Summary (Last 24 hours) at 08/29/14 1045 Last data filed at 08/29/14 0414  Gross per 24 hour  Intake     357 ml  Output      0 ml  Net    357 ml   Weight change:   Physical Exam: Gen: Appears to be uncomfortable resting on the side of her bed CVS: Pulse regular tachycardia, S1 and S2 with ESM Resp: Clear to auscultation, no rales Abd: Soft, obese, nontender Ext: 3+ lower extremity edema, 2+ upper extremity edema  Imaging: No results found.  Labs: BMET  Recent Labs Lab 08/23/14 0736 08/24/14 1240 08/25/14 0550 08/26/14 0550 08/27/14 0545 08/28/14 0700 08/29/14 0415  NA 132* 132* 133* 133* 131* 130* 135  K 4.0 3.6 3.6 3.8 3.6 4.0 4.1  CL 101 100 98 98 97 96 99  CO2 20 23 22 23 23 28 25   GLUCOSE 118* 150* 130* 129* 121* 114* 107*  BUN 46* 29* 33* 39* 44* 51* 28*  CREATININE 6.29* 4.88* 5.59* 6.49* 7.05* 7.62* 5.26*  CALCIUM 7.6* 7.4* 7.7* 8.0* 7.8* 7.8* 7.5*  PHOS  --  5.1* 5.5* 6.2* 6.7* 7.1* 4.7*   CBC  Recent Labs Lab 08/25/14 0550 08/26/14 0550 08/27/14 0545 08/28/14 0700 08/28/14 1620  WBC 11.2* 10.2 9.5 9.9  --   HGB 7.2* 7.5* 7.1* 7.4* 8.7*  HCT 21.5* 22.7* 21.9* 22.6* 26.1*  MCV 90.0 90.1 90.1 89.3  --   PLT 474* 457* 434* 476*  --     Medications:    . antiseptic oral rinse  7 mL Mouth Rinse q12n4p  . aspirin  81 mg Oral Daily  .  ceFAZolin (ANCEF) IV  2 g Intravenous Q M,W,F-HD  . chlorhexidine      .  chlorhexidine  15 mL Mouth Rinse BID  . cyclobenzaprine  5 mg Oral BID  . famotidine  20 mg Oral Daily  . fentaNYL      . ferumoxytol  510 mg Intravenous Weekly  . heparin      . hydrALAZINE  25 mg Oral TID  . insulin aspart  0-9 Units Subcutaneous TID WC  . lanthanum  1,000 mg Oral TID WC  . lidocaine-EPINEPHrine      . metoprolol tartrate  50 mg Oral BID  . midazolam      . nicotine  21 mg Transdermal Daily  . polyethylene glycol  17 g Oral BID  . sodium chloride  10-40 mL Intracatheter Q12H  . sodium chloride  3 mL Intravenous Q12H    Freddi Schrager A   08/29/2014, 10:45 AM

## 2014-08-29 NOTE — Progress Notes (Signed)
Evaluated pt at bedside. Pt in NAD, states N/V has resolved. Abd Xray with no acute findings.   Lacy Duverney [facility]

## 2014-08-29 NOTE — Sedation Documentation (Signed)
MD will place IV access using [place] for antibiotic/sedation meds; Pt is difficult IV "stick:

## 2014-08-30 DIAGNOSIS — B1710 Acute hepatitis C without hepatic coma: Secondary | ICD-10-CM

## 2014-08-30 LAB — TYPE AND SCREEN
ABO/RH(D): A POS
Antibody Screen: NEGATIVE
Unit division: 0
Unit division: 0
Unit division: 0

## 2014-08-30 LAB — RENAL FUNCTION PANEL
Albumin: 1.2 g/dL — ABNORMAL LOW (ref 3.5–5.2)
Anion gap: 8 (ref 5–15)
BUN: 33 mg/dL — ABNORMAL HIGH (ref 6–23)
CO2: 25 mmol/L (ref 19–32)
Calcium: 7.9 mg/dL — ABNORMAL LOW (ref 8.4–10.5)
Chloride: 99 mmol/L (ref 96–112)
Creatinine, Ser: 6.62 mg/dL — ABNORMAL HIGH (ref 0.50–1.10)
GFR calc Af Amer: 7 mL/min — ABNORMAL LOW (ref >90)
GFR calc non Af Amer: 6 mL/min — ABNORMAL LOW (ref >90)
Glucose, Bld: 99 mg/dL (ref 70–99)
Phosphorus: 5.4 mg/dL — ABNORMAL HIGH (ref 2.3–4.6)
Potassium: 4.4 mmol/L (ref 3.5–5.1)
Sodium: 132 mmol/L — ABNORMAL LOW (ref 135–145)

## 2014-08-30 LAB — GLUCOSE, CAPILLARY
Glucose-Capillary: 101 mg/dL — ABNORMAL HIGH (ref 70–99)
Glucose-Capillary: 118 mg/dL — ABNORMAL HIGH (ref 70–99)
Glucose-Capillary: 101 mg/dL — ABNORMAL HIGH (ref 70–99)
Glucose-Capillary: 149 mg/dL — ABNORMAL HIGH (ref 70–99)

## 2014-08-30 MED ORDER — DARBEPOETIN ALFA 100 MCG/0.5ML IJ SOSY
100.0000 ug | PREFILLED_SYRINGE | INTRAMUSCULAR | Status: DC
  Filled 2014-08-30: qty 0.5

## 2014-08-30 MED ORDER — METOPROLOL TARTRATE 50 MG PO TABS
75.0000 mg | ORAL_TABLET | Freq: Two times a day (BID) | ORAL | Status: DC
  Administered 2014-08-31 – 2014-09-11 (×24): 75 mg via ORAL
  Filled 2014-08-30 (×29): qty 1

## 2014-08-30 MED ORDER — HYDROMORPHONE HCL 1 MG/ML IJ SOLN
INTRAMUSCULAR | Status: AC
  Filled 2014-08-30: qty 1

## 2014-08-30 NOTE — Progress Notes (Signed)
Subjective: Interval History: has complaints , swelling.  Objective: Vital signs in last 24 hours: Temp:  [98 F (36.7 C)-98.8 F (37.1 C)] 98.8 F (37.1 C) (03/16 0944) Pulse Rate:  [80-93] 88 (03/16 0944) Resp:  [16-18] 17 (03/16 0944) BP: (107-155)/(64-94) 107/92 mmHg (03/16 0944) SpO2:  [94 %-99 %] 94 % (03/16 0944) Weight change:   Intake/Output from previous day: 03/15 0701 - 03/16 0700 In: 600 [P.O.:600] Out: 1 [Stool:1] Intake/Output this shift: Total I/O In: 240 [P.O.:240] Out: 1 [Stool:1]  General appearance: alert, cooperative and moderately obese Resp: diminished breath sounds bilaterally Chest wall: RIJ cath Cardio: S1, S2 normal and systolic murmur: holosystolic 2/6, blowing at apex GI: pos bs, obese, striae, abdm wall edema Extremities: edema 3+  Lab Results:  Recent Labs  08/28/14 0700 08/28/14 1620  WBC 9.9  --   HGB 7.4* 8.7*  HCT 22.6* 26.1*  PLT 476*  --    BMET:  Recent Labs  08/29/14 0415 08/30/14 0515  NA 135 132*  K 4.1 4.4  CL 99 99  CO2 25 25  GLUCOSE 107* 99  BUN 28* 33*  CREATININE 5.26* 6.62*  CALCIUM 7.5* 7.9*   No results for input(s): PTH in the last 72 hours. Iron Studies: No results for input(s): IRON, TIBC, TRANSFERRIN, FERRITIN in the last 72 hours.  Studies/Results: Ir Fluoro Guide Cv Line Left  08/29/2014   INDICATION: End-stage renal disease - in need of conversion of temporary right internal jugular approach dialysis catheter to a tunneled/permanent dialysis catheter for continuation of dialysis.  Patient is admitted to the hospital and in need of durable intravenous access for medication administration and blood draws. As such, given patient's history of end-stage renal disease, request made for placement of a left internal jugular approach PICC line.  EXAM: 1. CONVERSION OF EXISTING RIGHT INTERNAL JUGULAR APPROACH TEMPORARY DIALYSIS CATHETER TO A TUNNELED CENTRAL VENOUS HEMODIALYSIS CATHETER PLACEMENT WITH  FLUOROSCOPIC GUIDANCE 2. ULTRASOUND GUIDANCE FOR VENOUS ACCESS 3. ULTRASOUND FLUOROSCOPIC GUIDED PLACEMENT OF A LEFT INTERNAL JUGULAR APPROACH PICC LINE  MEDICATIONS: Ancef 2 gm IV; The IV antibiotic was given in an appropriate time interval prior to skin puncture.  CONTRAST:  None  ANESTHESIA/SEDATION: Versed 3 mg IV; Fentanyl 100 mcg IV  Total Moderate Sedation Time  40 minutes.  FLUOROSCOPY TIME:  48 seconds (40.9 mGy)  COMPLICATIONS: None immediate  PROCEDURE: Informed written consent was obtained from the patient after a discussion of the risks, benefits, and alternatives to treatment. Questions regarding the procedure were encouraged and answered.  Initially, attention was paid towards acquisition of a left internal jugular approach PICC line. As such, the left neck and chest were prepped with chlorhexidine in a sterile fashion, and a sterile drape was applied covering the operative field. Maximum barrier sterile technique with sterile gowns and gloves were used for the procedure. A timeout was performed prior to the initiation of the procedure.  After creating a small venotomy incision, a micropuncture kit was utilized to access the left internal jugular vein under direct, real-time ultrasound guidance after the overlying soft tissues were anesthetized with 1% lidocaine with epinephrine. Ultrasound image documentation was performed. The microwire was kinked to measure appropriate catheter length. Ultimately, a 19 cm dual lumen PICC line was advanced through the peel-away sheath with tip terminating within the superior aspect of the right atrium. A post placement spot fluoroscopic image was obtained. The catheter was secured at the exit site with an interrupted suture.  Attention was now paid  towards conversion of the existing temporary right internal jugular approach dialysis catheter to a permanent/tunneled dialysis catheter. The existing temporary dialysis catheter was used for measurement purposes. A  HemoSplit tunneled hemodialysis catheter measuring 23 cm from tip to cuff was tunneled in a retrograde fashion from the anterior chest wall to the venotomy incision.  A stiff Glidewire was cannulated within in the temporary dialysis catheter advanced the level of the IVC. Under intermittent fluoroscopic guidance, the existing temporary dialysis catheter was exchanged for a peel-away sheath. The tunneled hemodialysis catheter was then placed through the peel-away sheath with tips ultimately positioned within the superior aspect of the right atrium. Final catheter positioning was confirmed and documented with a spot radiographic image. The catheter aspirates and flushes normally. The catheter was flushed with appropriate volume heparin dwells.  The catheter exit site was secured with a 0-Prolene retention suture. The venotomy incision was closed with an interrupted 4-0 Vicryl, Dermabond and Steri-strips.  Dressings were applied. The patient tolerated the above procedures well without immediate post procedural complication.  IMPRESSION: 1. Successful conversion of a 23 cm tip to cuff tunneled hemodialysis catheter with tips terminating within the superior aspect of the right atrium. The catheter is ready for immediate use. 2. Successful placement of a left internal jugular approach dual lumen PICC line with tip terminating within the superior aspect of the right atrium. The catheter is ready for immediate use.   Electronically Signed   By: Sandi Mariscal M.D.   On: 08/29/2014 11:02   Ir Fluoro Guide Cv Line Right  08/29/2014   INDICATION: End-stage renal disease - in need of conversion of temporary right internal jugular approach dialysis catheter to a tunneled/permanent dialysis catheter for continuation of dialysis.  Patient is admitted to the hospital and in need of durable intravenous access for medication administration and blood draws. As such, given patient's history of end-stage renal disease, request made for  placement of a left internal jugular approach PICC line.  EXAM: 1. CONVERSION OF EXISTING RIGHT INTERNAL JUGULAR APPROACH TEMPORARY DIALYSIS CATHETER TO A TUNNELED CENTRAL VENOUS HEMODIALYSIS CATHETER PLACEMENT WITH FLUOROSCOPIC GUIDANCE 2. ULTRASOUND GUIDANCE FOR VENOUS ACCESS 3. ULTRASOUND FLUOROSCOPIC GUIDED PLACEMENT OF A LEFT INTERNAL JUGULAR APPROACH PICC LINE  MEDICATIONS: Ancef 2 gm IV; The IV antibiotic was given in an appropriate time interval prior to skin puncture.  CONTRAST:  None  ANESTHESIA/SEDATION: Versed 3 mg IV; Fentanyl 100 mcg IV  Total Moderate Sedation Time  40 minutes.  FLUOROSCOPY TIME:  48 seconds (96.7 mGy)  COMPLICATIONS: None immediate  PROCEDURE: Informed written consent was obtained from the patient after a discussion of the risks, benefits, and alternatives to treatment. Questions regarding the procedure were encouraged and answered.  Initially, attention was paid towards acquisition of a left internal jugular approach PICC line. As such, the left neck and chest were prepped with chlorhexidine in a sterile fashion, and a sterile drape was applied covering the operative field. Maximum barrier sterile technique with sterile gowns and gloves were used for the procedure. A timeout was performed prior to the initiation of the procedure.  After creating a small venotomy incision, a micropuncture kit was utilized to access the left internal jugular vein under direct, real-time ultrasound guidance after the overlying soft tissues were anesthetized with 1% lidocaine with epinephrine. Ultrasound image documentation was performed. The microwire was kinked to measure appropriate catheter length. Ultimately, a 19 cm dual lumen PICC line was advanced through the peel-away sheath with tip terminating within the superior  aspect of the right atrium. A post placement spot fluoroscopic image was obtained. The catheter was secured at the exit site with an interrupted suture.  Attention was now paid  towards conversion of the existing temporary right internal jugular approach dialysis catheter to a permanent/tunneled dialysis catheter. The existing temporary dialysis catheter was used for measurement purposes. A HemoSplit tunneled hemodialysis catheter measuring 23 cm from tip to cuff was tunneled in a retrograde fashion from the anterior chest wall to the venotomy incision.  A stiff Glidewire was cannulated within in the temporary dialysis catheter advanced the level of the IVC. Under intermittent fluoroscopic guidance, the existing temporary dialysis catheter was exchanged for a peel-away sheath. The tunneled hemodialysis catheter was then placed through the peel-away sheath with tips ultimately positioned within the superior aspect of the right atrium. Final catheter positioning was confirmed and documented with a spot radiographic image. The catheter aspirates and flushes normally. The catheter was flushed with appropriate volume heparin dwells.  The catheter exit site was secured with a 0-Prolene retention suture. The venotomy incision was closed with an interrupted 4-0 Vicryl, Dermabond and Steri-strips.  Dressings were applied. The patient tolerated the above procedures well without immediate post procedural complication.  IMPRESSION: 1. Successful conversion of a 23 cm tip to cuff tunneled hemodialysis catheter with tips terminating within the superior aspect of the right atrium. The catheter is ready for immediate use. 2. Successful placement of a left internal jugular approach dual lumen PICC line with tip terminating within the superior aspect of the right atrium. The catheter is ready for immediate use.   Electronically Signed   By: Sandi Mariscal M.D.   On: 08/29/2014 11:02   Ir US Guide Vasc Access Right  08/29/2014   INDICATION: End-stage renal disease - in need of conversion of temporary right internal jugular approach dialysis catheter to a tunneled/permanent dialysis catheter for continuation  of dialysis.  Patient is admitted to the hospital and in need of durable intravenous access for medication administration and blood draws. As such, given patient's history of end-stage renal disease, request made for placement of a left internal jugular approach PICC line.  EXAM: 1. CONVERSION OF EXISTING RIGHT INTERNAL JUGULAR APPROACH TEMPORARY DIALYSIS CATHETER TO A TUNNELED CENTRAL VENOUS HEMODIALYSIS CATHETER PLACEMENT WITH FLUOROSCOPIC GUIDANCE 2. ULTRASOUND GUIDANCE FOR VENOUS ACCESS 3. ULTRASOUND FLUOROSCOPIC GUIDED PLACEMENT OF A LEFT INTERNAL JUGULAR APPROACH PICC LINE  MEDICATIONS: Ancef 2 gm IV; The IV antibiotic was given in an appropriate time interval prior to skin puncture.  CONTRAST:  None  ANESTHESIA/SEDATION: Versed 3 mg IV; Fentanyl 100 mcg IV  Total Moderate Sedation Time  40 minutes.  FLUOROSCOPY TIME:  48 seconds (63.8 mGy)  COMPLICATIONS: None immediate  PROCEDURE: Informed written consent was obtained from the patient after a discussion of the risks, benefits, and alternatives to treatment. Questions regarding the procedure were encouraged and answered.  Initially, attention was paid towards acquisition of a left internal jugular approach PICC line. As such, the left neck and chest were prepped with chlorhexidine in a sterile fashion, and a sterile drape was applied covering the operative field. Maximum barrier sterile technique with sterile gowns and gloves were used for the procedure. A timeout was performed prior to the initiation of the procedure.  After creating a small venotomy incision, a micropuncture kit was utilized to access the left internal jugular vein under direct, real-time ultrasound guidance after the overlying soft tissues were anesthetized with 1% lidocaine with epinephrine. Ultrasound image documentation was  performed. The microwire was kinked to measure appropriate catheter length. Ultimately, a 19 cm dual lumen PICC line was advanced through the peel-away sheath with  tip terminating within the superior aspect of the right atrium. A post placement spot fluoroscopic image was obtained. The catheter was secured at the exit site with an interrupted suture.  Attention was now paid towards conversion of the existing temporary right internal jugular approach dialysis catheter to a permanent/tunneled dialysis catheter. The existing temporary dialysis catheter was used for measurement purposes. A HemoSplit tunneled hemodialysis catheter measuring 23 cm from tip to cuff was tunneled in a retrograde fashion from the anterior chest wall to the venotomy incision.  A stiff Glidewire was cannulated within in the temporary dialysis catheter advanced the level of the IVC. Under intermittent fluoroscopic guidance, the existing temporary dialysis catheter was exchanged for a peel-away sheath. The tunneled hemodialysis catheter was then placed through the peel-away sheath with tips ultimately positioned within the superior aspect of the right atrium. Final catheter positioning was confirmed and documented with a spot radiographic image. The catheter aspirates and flushes normally. The catheter was flushed with appropriate volume heparin dwells.  The catheter exit site was secured with a 0-Prolene retention suture. The venotomy incision was closed with an interrupted 4-0 Vicryl, Dermabond and Steri-strips.  Dressings were applied. The patient tolerated the above procedures well without immediate post procedural complication.  IMPRESSION: 1. Successful conversion of a 23 cm tip to cuff tunneled hemodialysis catheter with tips terminating within the superior aspect of the right atrium. The catheter is ready for immediate use. 2. Successful placement of a left internal jugular approach dual lumen PICC line with tip terminating within the superior aspect of the right atrium. The catheter is ready for immediate use.   Electronically Signed   By: Sandi Mariscal M.D.   On: 08/29/2014 11:02   Dg Abd Portable  1v  08/29/2014   CLINICAL DATA:  Nausea vomiting today.  EXAM: PORTABLE ABDOMEN - 1 VIEW  COMPARISON:  01/03/2015  FINDINGS: There are small amounts of colonic contrast. Numerous surgical clips are identified in the right upper quadrant and right lower quadrant. Bowel gas pattern is nonobstructive. No evidence for free intraperitoneal air.  IMPRESSION: Residual small amounts of contrast. Correlation with timing of previous contrast exam is recommended. No recent exams have been performed with oral contrast in the [facility] system.   Electronically Signed   By: Nolon Nations M.D.   On: 08/29/2014 19:32    I have reviewed the patient's current medications.  Assessment/Plan: 1 AKI not recovering, oliguric, vol xs, ^ solute, will do HD. 2 Anemia epo 3 MSSA cont Ancef 4 Septic joints 5 Hep C 6 DM 7 Obesity P HD, epo, AB, remove vol    LOS: 23 days   Brandy Houston 08/30/2014,11:17 AM

## 2014-08-30 NOTE — Progress Notes (Signed)
Rehab admissions - I am signing off for acute inpatient rehab admissions at this point.  Patient continues on HD.  Likely will need SNF or HH therapies at the time of discharge.  Call me for questions.  RC:[phone]

## 2014-08-30 NOTE — Procedures (Signed)
I was present at this session.  I have reviewed the session itself and made appropriate changes.  Cath working well. bp ^, getting 4 l off.  Sae Handrich L 3/16/20163:06 PM

## 2014-08-30 NOTE — Progress Notes (Signed)
[facility] PROGRESS NOTE  Kairah Leoni QIH:[id number] DOB: 11/10/1955 DOA: 08/07/2014 PCP: Chari Manning, NP Interim summary: 59 year old lady with multiple medical problems initially admitted for left shoulder pain, further work up revealed MSSA bacteremia with septic arthritis of the numerous joint.  Assessment/Plan: 1.  MSSA BACTEREMIA with septic arthritis: Currently on cefazolin to continue a total of 8 weeks of treatment.  ID and orthopedics on board.  Pain control and Physical therapy.    2. ESRD on HD: Further management as per renal.   Diabetes mellitus; CBG (last 3)   Recent Labs  08/30/14 0742 08/30/14 1139 08/30/14 1830  GLUCAP 101* 118* 101*    Good control of blood sugars.  Resume SSI.   3. Hepatitis C antibody positive: further management with ID as outpatient.    Anemia; Anemia probably from chronic disease.  epo as per renal.  Repeat hemoglobin tomorrow.   Severe hypoalbuminemia:    Tobacco abuse: Counseling given.     Hypertension: Not well controlled. Increase metoprolol to 75 mg bid.      Code Status: fullc ode.  Family Communication: none at bedside Disposition Plan: pending.    Consultants:  Renal  ID  Procedures:  hd  Antibiotics:  Ancef.   HPI/Subjective: Pain not well controlled, no other complaints.   Objective: Filed Vitals:   08/30/14 1800  BP: 166/86  Pulse: 102  Temp: 98.4 F (36.9 C)  Resp: 20    Intake/Output Summary (Last 24 hours) at 08/30/14 1826 Last data filed at 08/30/14 1800  Gross per 24 hour  Intake    480 ml  Output   4002 ml  Net  -3522 ml   Filed Weights   08/26/14 2150 08/27/14 2053 08/28/14 0633  Weight: 112.129 kg (247 lb 3.2 oz) 112.5 kg (248 lb 0.3 oz) 108.6 kg (239 lb 6.7 oz)    Exam:   General:  Alert afebrile comfortable  Cardiovascular: s1s2, tachycardic.   Respiratory: diminished air entry at bases, no wheezing or rhonchi  Abdomen: soft non tender non  distended bowel sounds heard  Musculoskeletal: leg edema 2+  Data Reviewed: Basic Metabolic Panel:  Recent Labs Lab 08/26/14 0550 08/27/14 0545 08/28/14 0700 08/29/14 0415 08/30/14 0515  NA 133* 131* 130* 135 132*  K 3.8 3.6 4.0 4.1 4.4  CL 98 97 96 99 99  CO2 '23 23 28 25 25  ' GLUCOSE 129* 121* 114* 107* 99  BUN 39* 44* 51* 28* 33*  CREATININE 6.49* 7.05* 7.62* 5.26* 6.62*  CALCIUM 8.0* 7.8* 7.8* 7.5* 7.9*  PHOS 6.2* 6.7* 7.1* 4.7* 5.4*   Liver Function Tests:  Recent Labs Lab 08/27/14 0545 08/28/14 0700 08/28/14 1953 08/29/14 0415 08/30/14 0515  AST  --   --  12  --   --   ALT  --   --  <5  --   --   ALKPHOS  --   --  100  --   --   BILITOT  --   --  0.7  --   --   PROT  --   --  6.7  --   --   ALBUMIN 1.1* 1.1* 1.2* 1.2* 1.2*   No results for input(s): LIPASE, AMYLASE in the last 168 hours. No results for input(s): AMMONIA in the last 168 hours. CBC:  Recent Labs Lab 08/25/14 0550 08/26/14 0550 08/27/14 0545 08/28/14 0700 08/28/14 1620  WBC 11.2* 10.2 9.5 9.9  --   HGB 7.2* 7.5* 7.1* 7.4*  8.7*  HCT 21.5* 22.7* 21.9* 22.6* 26.1*  MCV 90.0 90.1 90.1 89.3  --   PLT 474* 457* 434* 476*  --    Cardiac Enzymes: No results for input(s): CKTOTAL, CKMB, CKMBINDEX, TROPONINI in the last 168 hours. BNP (last 3 results) No results for input(s): BNP in the last 8760 hours.  ProBNP (last 3 results) No results for input(s): PROBNP in the last 8760 hours.  CBG:  Recent Labs Lab 08/29/14 1118 08/29/14 1640 08/29/14 2101 08/30/14 0742 08/30/14 1139  GLUCAP 95 124* 107* 101* 118*    No results found for this or any previous visit (from the past 240 hour(s)).   Studies: Ir Fluoro Guide Cv Line Left  08/29/2014   INDICATION: End-stage renal disease - in need of conversion of temporary right internal jugular approach dialysis catheter to a tunneled/permanent dialysis catheter for continuation of dialysis.  Patient is admitted to the hospital and in need of  durable intravenous access for medication administration and blood draws. As such, given patient's history of end-stage renal disease, request made for placement of a left internal jugular approach PICC line.  EXAM: 1. CONVERSION OF EXISTING RIGHT INTERNAL JUGULAR APPROACH TEMPORARY DIALYSIS CATHETER TO A TUNNELED CENTRAL VENOUS HEMODIALYSIS CATHETER PLACEMENT WITH FLUOROSCOPIC GUIDANCE 2. ULTRASOUND GUIDANCE FOR VENOUS ACCESS 3. ULTRASOUND FLUOROSCOPIC GUIDED PLACEMENT OF A LEFT INTERNAL JUGULAR APPROACH PICC LINE  MEDICATIONS: Ancef 2 gm IV; The IV antibiotic was given in an appropriate time interval prior to skin puncture.  CONTRAST:  None  ANESTHESIA/SEDATION: Versed 3 mg IV; Fentanyl 100 mcg IV  Total Moderate Sedation Time  40 minutes.  FLUOROSCOPY TIME:  48 seconds (86.7 mGy)  COMPLICATIONS: None immediate  PROCEDURE: Informed written consent was obtained from the patient after a discussion of the risks, benefits, and alternatives to treatment. Questions regarding the procedure were encouraged and answered.  Initially, attention was paid towards acquisition of a left internal jugular approach PICC line. As such, the left neck and chest were prepped with chlorhexidine in a sterile fashion, and a sterile drape was applied covering the operative field. Maximum barrier sterile technique with sterile gowns and gloves were used for the procedure. A timeout was performed prior to the initiation of the procedure.  After creating a small venotomy incision, a micropuncture kit was utilized to access the left internal jugular vein under direct, real-time ultrasound guidance after the overlying soft tissues were anesthetized with 1% lidocaine with epinephrine. Ultrasound image documentation was performed. The microwire was kinked to measure appropriate catheter length. Ultimately, a 19 cm dual lumen PICC line was advanced through the peel-away sheath with tip terminating within the superior aspect of the right atrium. A  post placement spot fluoroscopic image was obtained. The catheter was secured at the exit site with an interrupted suture.  Attention was now paid towards conversion of the existing temporary right internal jugular approach dialysis catheter to a permanent/tunneled dialysis catheter. The existing temporary dialysis catheter was used for measurement purposes. A HemoSplit tunneled hemodialysis catheter measuring 23 cm from tip to cuff was tunneled in a retrograde fashion from the anterior chest wall to the venotomy incision.  A stiff Glidewire was cannulated within in the temporary dialysis catheter advanced the level of the IVC. Under intermittent fluoroscopic guidance, the existing temporary dialysis catheter was exchanged for a peel-away sheath. The tunneled hemodialysis catheter was then placed through the peel-away sheath with tips ultimately positioned within the superior aspect of the right atrium. Final catheter positioning was confirmed  and documented with a spot radiographic image. The catheter aspirates and flushes normally. The catheter was flushed with appropriate volume heparin dwells.  The catheter exit site was secured with a 0-Prolene retention suture. The venotomy incision was closed with an interrupted 4-0 Vicryl, Dermabond and Steri-strips.  Dressings were applied. The patient tolerated the above procedures well without immediate post procedural complication.  IMPRESSION: 1. Successful conversion of a 23 cm tip to cuff tunneled hemodialysis catheter with tips terminating within the superior aspect of the right atrium. The catheter is ready for immediate use. 2. Successful placement of a left internal jugular approach dual lumen PICC line with tip terminating within the superior aspect of the right atrium. The catheter is ready for immediate use.   Electronically Signed   By: Sandi Mariscal M.D.   On: 08/29/2014 11:02   Ir Fluoro Guide Cv Line Right  08/29/2014   INDICATION: End-stage renal disease  - in need of conversion of temporary right internal jugular approach dialysis catheter to a tunneled/permanent dialysis catheter for continuation of dialysis.  Patient is admitted to the hospital and in need of durable intravenous access for medication administration and blood draws. As such, given patient's history of end-stage renal disease, request made for placement of a left internal jugular approach PICC line.  EXAM: 1. CONVERSION OF EXISTING RIGHT INTERNAL JUGULAR APPROACH TEMPORARY DIALYSIS CATHETER TO A TUNNELED CENTRAL VENOUS HEMODIALYSIS CATHETER PLACEMENT WITH FLUOROSCOPIC GUIDANCE 2. ULTRASOUND GUIDANCE FOR VENOUS ACCESS 3. ULTRASOUND FLUOROSCOPIC GUIDED PLACEMENT OF A LEFT INTERNAL JUGULAR APPROACH PICC LINE  MEDICATIONS: Ancef 2 gm IV; The IV antibiotic was given in an appropriate time interval prior to skin puncture.  CONTRAST:  None  ANESTHESIA/SEDATION: Versed 3 mg IV; Fentanyl 100 mcg IV  Total Moderate Sedation Time  40 minutes.  FLUOROSCOPY TIME:  48 seconds (09.3 mGy)  COMPLICATIONS: None immediate  PROCEDURE: Informed written consent was obtained from the patient after a discussion of the risks, benefits, and alternatives to treatment. Questions regarding the procedure were encouraged and answered.  Initially, attention was paid towards acquisition of a left internal jugular approach PICC line. As such, the left neck and chest were prepped with chlorhexidine in a sterile fashion, and a sterile drape was applied covering the operative field. Maximum barrier sterile technique with sterile gowns and gloves were used for the procedure. A timeout was performed prior to the initiation of the procedure.  After creating a small venotomy incision, a micropuncture kit was utilized to access the left internal jugular vein under direct, real-time ultrasound guidance after the overlying soft tissues were anesthetized with 1% lidocaine with epinephrine. Ultrasound image documentation was performed. The  microwire was kinked to measure appropriate catheter length. Ultimately, a 19 cm dual lumen PICC line was advanced through the peel-away sheath with tip terminating within the superior aspect of the right atrium. A post placement spot fluoroscopic image was obtained. The catheter was secured at the exit site with an interrupted suture.  Attention was now paid towards conversion of the existing temporary right internal jugular approach dialysis catheter to a permanent/tunneled dialysis catheter. The existing temporary dialysis catheter was used for measurement purposes. A HemoSplit tunneled hemodialysis catheter measuring 23 cm from tip to cuff was tunneled in a retrograde fashion from the anterior chest wall to the venotomy incision.  A stiff Glidewire was cannulated within in the temporary dialysis catheter advanced the level of the IVC. Under intermittent fluoroscopic guidance, the existing temporary dialysis catheter was exchanged for a  peel-away sheath. The tunneled hemodialysis catheter was then placed through the peel-away sheath with tips ultimately positioned within the superior aspect of the right atrium. Final catheter positioning was confirmed and documented with a spot radiographic image. The catheter aspirates and flushes normally. The catheter was flushed with appropriate volume heparin dwells.  The catheter exit site was secured with a 0-Prolene retention suture. The venotomy incision was closed with an interrupted 4-0 Vicryl, Dermabond and Steri-strips.  Dressings were applied. The patient tolerated the above procedures well without immediate post procedural complication.  IMPRESSION: 1. Successful conversion of a 23 cm tip to cuff tunneled hemodialysis catheter with tips terminating within the superior aspect of the right atrium. The catheter is ready for immediate use. 2. Successful placement of a left internal jugular approach dual lumen PICC line with tip terminating within the superior aspect of  the right atrium. The catheter is ready for immediate use.   Electronically Signed   By: Sandi Mariscal M.D.   On: 08/29/2014 11:02   Ir US Guide Vasc Access Right  08/29/2014   INDICATION: End-stage renal disease - in need of conversion of temporary right internal jugular approach dialysis catheter to a tunneled/permanent dialysis catheter for continuation of dialysis.  Patient is admitted to the hospital and in need of durable intravenous access for medication administration and blood draws. As such, given patient's history of end-stage renal disease, request made for placement of a left internal jugular approach PICC line.  EXAM: 1. CONVERSION OF EXISTING RIGHT INTERNAL JUGULAR APPROACH TEMPORARY DIALYSIS CATHETER TO A TUNNELED CENTRAL VENOUS HEMODIALYSIS CATHETER PLACEMENT WITH FLUOROSCOPIC GUIDANCE 2. ULTRASOUND GUIDANCE FOR VENOUS ACCESS 3. ULTRASOUND FLUOROSCOPIC GUIDED PLACEMENT OF A LEFT INTERNAL JUGULAR APPROACH PICC LINE  MEDICATIONS: Ancef 2 gm IV; The IV antibiotic was given in an appropriate time interval prior to skin puncture.  CONTRAST:  None  ANESTHESIA/SEDATION: Versed 3 mg IV; Fentanyl 100 mcg IV  Total Moderate Sedation Time  40 minutes.  FLUOROSCOPY TIME:  48 seconds (92.3 mGy)  COMPLICATIONS: None immediate  PROCEDURE: Informed written consent was obtained from the patient after a discussion of the risks, benefits, and alternatives to treatment. Questions regarding the procedure were encouraged and answered.  Initially, attention was paid towards acquisition of a left internal jugular approach PICC line. As such, the left neck and chest were prepped with chlorhexidine in a sterile fashion, and a sterile drape was applied covering the operative field. Maximum barrier sterile technique with sterile gowns and gloves were used for the procedure. A timeout was performed prior to the initiation of the procedure.  After creating a small venotomy incision, a micropuncture kit was utilized to access  the left internal jugular vein under direct, real-time ultrasound guidance after the overlying soft tissues were anesthetized with 1% lidocaine with epinephrine. Ultrasound image documentation was performed. The microwire was kinked to measure appropriate catheter length. Ultimately, a 19 cm dual lumen PICC line was advanced through the peel-away sheath with tip terminating within the superior aspect of the right atrium. A post placement spot fluoroscopic image was obtained. The catheter was secured at the exit site with an interrupted suture.  Attention was now paid towards conversion of the existing temporary right internal jugular approach dialysis catheter to a permanent/tunneled dialysis catheter. The existing temporary dialysis catheter was used for measurement purposes. A HemoSplit tunneled hemodialysis catheter measuring 23 cm from tip to cuff was tunneled in a retrograde fashion from the anterior chest wall to the venotomy incision.  A stiff Glidewire was cannulated within in the temporary dialysis catheter advanced the level of the IVC. Under intermittent fluoroscopic guidance, the existing temporary dialysis catheter was exchanged for a peel-away sheath. The tunneled hemodialysis catheter was then placed through the peel-away sheath with tips ultimately positioned within the superior aspect of the right atrium. Final catheter positioning was confirmed and documented with a spot radiographic image. The catheter aspirates and flushes normally. The catheter was flushed with appropriate volume heparin dwells.  The catheter exit site was secured with a 0-Prolene retention suture. The venotomy incision was closed with an interrupted 4-0 Vicryl, Dermabond and Steri-strips.  Dressings were applied. The patient tolerated the above procedures well without immediate post procedural complication.  IMPRESSION: 1. Successful conversion of a 23 cm tip to cuff tunneled hemodialysis catheter with tips terminating within  the superior aspect of the right atrium. The catheter is ready for immediate use. 2. Successful placement of a left internal jugular approach dual lumen PICC line with tip terminating within the superior aspect of the right atrium. The catheter is ready for immediate use.   Electronically Signed   By: Sandi Mariscal M.D.   On: 08/29/2014 11:02   Dg Abd Portable 1v  08/29/2014   CLINICAL DATA:  Nausea vomiting today.  EXAM: PORTABLE ABDOMEN - 1 VIEW  COMPARISON:  01/03/2015  FINDINGS: There are small amounts of colonic contrast. Numerous surgical clips are identified in the right upper quadrant and right lower quadrant. Bowel gas pattern is nonobstructive. No evidence for free intraperitoneal air.  IMPRESSION: Residual small amounts of contrast. Correlation with timing of previous contrast exam is recommended. No recent exams have been performed with oral contrast in the [facility] system.   Electronically Signed   By: Nolon Nations M.D.   On: 08/29/2014 19:32    Scheduled Meds: . antiseptic oral rinse  7 mL Mouth Rinse q12n4p  . aspirin  81 mg Oral Daily  .  ceFAZolin (ANCEF) IV  2 g Intravenous Q M,W,F-HD  . chlorhexidine  15 mL Mouth Rinse BID  . cyclobenzaprine  5 mg Oral BID  . [START ON 09/04/2014] darbepoetin (ARANESP) injection - DIALYSIS  100 mcg Intravenous Q Mon-HD  . famotidine  20 mg Oral Daily  . ferumoxytol  510 mg Intravenous Weekly  . heparin subcutaneous  5,000 Units Subcutaneous 3 times per day  . hydrALAZINE  25 mg Oral TID  . HYDROmorphone      . insulin aspart  0-9 Units Subcutaneous TID WC  . lanthanum  1,000 mg Oral TID WC  . metoprolol tartrate  50 mg Oral BID  . nicotine  21 mg Transdermal Daily  . polyethylene glycol  17 g Oral BID  . sodium chloride  10-40 mL Intracatheter Q12H  . sodium chloride  10-40 mL Intracatheter Q12H  . sodium chloride  3 mL Intravenous Q12H   Continuous Infusions:   Principal Problem:   Staphylococcus aureus bacteremia Active Problems:    Septic joint of left shoulder region   Sinus tachycardia   Septic joint of right knee joint   CKD (chronic kidney disease) stage 3, GFR 30-59 ml/min   Septic joint of right hand   SIRS (systemic inflammatory response syndrome)   Benign essential HTN   Hepatitis C   Acute renal failure   Acute renal failure syndrome   Hep C w/ coma, chronic   Joint infection    Time spent: 30 minutes.     [facility]  Pager [phone] If 7PM-7AM, please contact night-coverage at [email or web address], password [facility] 08/30/2014, 6:26 PM  LOS: 23 days

## 2014-08-30 NOTE — Progress Notes (Signed)
ANTIBIOTIC CONSULT NOTE - FOLLOW UP  Pharmacy Consult for Ancef Indication: Disseminated MSSA bacteremia  Allergies  Allergen Reactions  . Compazine [Prochlorperazine] Shortness Of Breath and Swelling    TONGUE SWELLS  . Omnipaque [Iohexol] Hives  . Shellfish-Derived Products Anaphylaxis  . Iodinated Diagnostic Agents Rash    Patient Measurements: Height: 5' 7.5" (171.5 cm) Weight: 239 lb 6.7 oz (108.6 kg) IBW/kg (Calculated) : 62.75  Vital Signs: Temp: 98.8 F (37.1 C) (03/16 0944) Temp Source: Oral (03/16 0944) BP: 107/92 mmHg (03/16 0944) Pulse Rate: 88 (03/16 0944) Intake/Output from previous day: 03/15 0701 - 03/16 0700 In: 600 [P.O.:600] Out: 1 [Stool:1] Intake/Output from this shift: Total I/O In: 240 [P.O.:240] Out: 1 [Stool:1]  Labs:  Recent Labs  08/28/14 0700 08/28/14 1620 08/29/14 0415 08/30/14 0515  WBC 9.9  --   --   --   HGB 7.4* 8.7*  --   --   PLT 476*  --   --   --   CREATININE 7.62*  --  5.26* 6.62*   Estimated Creatinine Clearance: 11.7 mL/min (by C-G formula based on Cr of 6.62). No results for input(s): VANCOTROUGH, VANCOPEAK, VANCORANDOM, GENTTROUGH, GENTPEAK, GENTRANDOM, TOBRATROUGH, TOBRAPEAK, TOBRARND, AMIKACINPEAK, AMIKACINTROU, AMIKACIN in the last 72 hours.   Microbiology: Recent Results (from the past 720 hour(s))  Culture, blood (routine x 2)     Status: None   Collection Time: 08/09/14 12:15 PM  Result Value Ref Range Status   Specimen Description BLOOD RIGHT HAND  Final   Special Requests   Final    BOTTLES DRAWN AEROBIC AND ANAEROBIC 7CCS BOTH BOTTLES   Culture   Final    STAPHYLOCOCCUS AUREUS Note: RIFAMPIN AND GENTAMICIN SHOULD NOT BE USED AS SINGLE DRUGS FOR TREATMENT OF STAPH INFECTIONS. Note: Gram Stain Report Called to,Read Back By and Verified With: Shirlean Kelly 700AM 08/10/14 [place] Performed at [facility]    Report Status 08/12/2014 FINAL  Final   Organism ID, Bacteria STAPHYLOCOCCUS AUREUS   Final      Susceptibility   Staphylococcus aureus - MIC*    CLINDAMYCIN <=0.25 SENSITIVE Sensitive     ERYTHROMYCIN <=0.25 SENSITIVE Sensitive     GENTAMICIN <=0.5 SENSITIVE Sensitive     LEVOFLOXACIN 0.25 SENSITIVE Sensitive     OXACILLIN <=0.25 SENSITIVE Sensitive     PENICILLIN >=0.5 RESISTANT Resistant     RIFAMPIN <=0.5 SENSITIVE Sensitive     TRIMETH/SULFA <=10 SENSITIVE Sensitive     VANCOMYCIN 1 SENSITIVE Sensitive     TETRACYCLINE <=1 SENSITIVE Sensitive     MOXIFLOXACIN <=0.25 SENSITIVE Sensitive     * STAPHYLOCOCCUS AUREUS  Culture, blood (routine x 2)     Status: None   Collection Time: 08/09/14 12:25 PM  Result Value Ref Range Status   Specimen Description BLOOD RIGHT ARM  Final   Special Requests   Final    BOTTLES DRAWN AEROBIC AND ANAEROBIC 10CC BOTH BOTTLES   Culture   Final    STAPHYLOCOCCUS AUREUS Note: SUSCEPTIBILITIES PERFORMED ON PREVIOUS CULTURE WITHIN THE LAST 5 DAYS. Note: Gram Stain Report Called to,Read Back By and Verified With: Shirlean Kelly 700AM 08/10/14 [place] Performed at [facility]    Report Status 08/12/2014 FINAL  Final  Culture, routine-abscess     Status: None   Collection Time: 08/10/14 11:54 AM  Result Value Ref Range Status   Specimen Description SHOULDER LEFT  Final   Special Requests NONE  Final   Gram Stain   Final  ABUNDANT WBC PRESENT, PREDOMINANTLY PMN NO SQUAMOUS EPITHELIAL CELLS SEEN MODERATE GRAM POSITIVE COCCI IN PAIRS IN CLUSTERS Performed at [facility]    Culture   Final    MODERATE STAPHYLOCOCCUS AUREUS Note: RIFAMPIN AND GENTAMICIN SHOULD NOT BE USED AS SINGLE DRUGS FOR TREATMENT OF STAPH INFECTIONS. Performed at [facility]    Report Status 08/13/2014 FINAL  Final   Organism ID, Bacteria STAPHYLOCOCCUS AUREUS  Final      Susceptibility   Staphylococcus aureus - MIC*    CLINDAMYCIN <=0.25 SENSITIVE Sensitive     ERYTHROMYCIN <=0.25 SENSITIVE Sensitive     GENTAMICIN <=0.5  SENSITIVE Sensitive     LEVOFLOXACIN 0.25 SENSITIVE Sensitive     OXACILLIN 0.5 SENSITIVE Sensitive     PENICILLIN >=0.5 RESISTANT Resistant     RIFAMPIN <=0.5 SENSITIVE Sensitive     TRIMETH/SULFA <=10 SENSITIVE Sensitive     VANCOMYCIN 1 SENSITIVE Sensitive     TETRACYCLINE <=1 SENSITIVE Sensitive     MOXIFLOXACIN <=0.25 SENSITIVE Sensitive     * MODERATE STAPHYLOCOCCUS AUREUS  Anaerobic culture     Status: None   Collection Time: 08/10/14 11:54 AM  Result Value Ref Range Status   Specimen Description SHOULDER LEFT  Final   Special Requests NONE  Final   Gram Stain   Final    MODERATE WBC PRESENT, PREDOMINANTLY PMN NO SQUAMOUS EPITHELIAL CELLS SEEN MODERATE GRAM POSITIVE COCCI IN PAIRS IN CLUSTERS Performed at [facility]    Culture   Final    NO ANAEROBES ISOLATED Note: NO PROPIONIBACTERIUM ISOLATED Performed at [facility]    Report Status 08/25/2014 FINAL  Final  Body fluid culture     Status: None   Collection Time: 08/10/14  5:22 PM  Result Value Ref Range Status   Specimen Description SYNOVIAL JOINT RIGHT KNEE  Final   Special Requests Normal  Final   Gram Stain   Final    ABUNDANT WBC PRESENT,BOTH PMN AND MONONUCLEAR NO ORGANISMS SEEN Performed at [facility]    Culture   Final    FEW STAPHYLOCOCCUS AUREUS Note: RIFAMPIN AND GENTAMICIN SHOULD NOT BE USED AS SINGLE DRUGS FOR TREATMENT OF STAPH INFECTIONS. CRITICAL RESULT CALLED TO, READ BACK BY AND VERIFIED WITH: CINDY FLORES RN 08/13/14 AT 46 AM BY [facility] Performed at [facility]    Report Status 08/14/2014 FINAL  Final   Organism ID, Bacteria STAPHYLOCOCCUS AUREUS  Final      Susceptibility   Staphylococcus aureus - MIC*    CLINDAMYCIN <=0.25 SENSITIVE Sensitive     ERYTHROMYCIN <=0.25 SENSITIVE Sensitive     GENTAMICIN <=0.5 SENSITIVE Sensitive     LEVOFLOXACIN 0.25 SENSITIVE Sensitive     OXACILLIN 0.5 SENSITIVE Sensitive     PENICILLIN >=0.5 RESISTANT Resistant      RIFAMPIN <=0.5 SENSITIVE Sensitive     TRIMETH/SULFA <=10 SENSITIVE Sensitive     VANCOMYCIN <=0.5 SENSITIVE Sensitive     TETRACYCLINE <=1 SENSITIVE Sensitive     MOXIFLOXACIN <=0.25 SENSITIVE Sensitive     * FEW STAPHYLOCOCCUS AUREUS  Surgical pcr screen     Status: Abnormal   Collection Time: 08/10/14  6:42 PM  Result Value Ref Range Status   MRSA, PCR NEGATIVE NEGATIVE Final   Staphylococcus aureus POSITIVE (A) NEGATIVE Final    Comment:        The Xpert SA Assay (FDA approved for NASAL specimens in patients over 58 years of age), is one component of a comprehensive  surveillance program.  Test performance has been validated by [facility] for patients greater than or equal to 97 year old. It is not intended to diagnose infection nor to guide or monitor treatment.   Body fluid culture     Status: None   Collection Time: 08/10/14  9:14 PM  Result Value Ref Range Status   Specimen Description FLUID LEFT AC JOINT  Final   Special Requests VANCO, ROCEPHIN  Final   Gram Stain   Final    FEW WBC PRESENT, PREDOMINANTLY PMN MODERATE GRAM POSITIVE COCCI IN PAIRS IN CLUSTERS Gram Stain Report Called to,Read Back By and Verified With: Gram Stain Report Called to,Read Back By and Verified With: Laurance Flatten RN 08/11/13 9:10AM BY [place] Performed at [facility]    Culture   Final    ABUNDANT STAPHYLOCOCCUS AUREUS Note: RIFAMPIN AND GENTAMICIN SHOULD NOT BE USED AS SINGLE DRUGS FOR TREATMENT OF STAPH INFECTIONS. Performed at [facility]    Report Status 08/13/2014 FINAL  Final   Organism ID, Bacteria STAPHYLOCOCCUS AUREUS  Final      Susceptibility   Staphylococcus aureus - MIC*    CLINDAMYCIN <=0.25 SENSITIVE Sensitive     ERYTHROMYCIN <=0.25 SENSITIVE Sensitive     GENTAMICIN <=0.5 SENSITIVE Sensitive     LEVOFLOXACIN 0.25 SENSITIVE Sensitive     OXACILLIN <=0.25 SENSITIVE Sensitive     PENICILLIN >=0.5 RESISTANT Resistant     RIFAMPIN <=0.5 SENSITIVE  Sensitive     TRIMETH/SULFA <=10 SENSITIVE Sensitive     VANCOMYCIN 1 SENSITIVE Sensitive     TETRACYCLINE <=1 SENSITIVE Sensitive     MOXIFLOXACIN <=0.25 SENSITIVE Sensitive     * ABUNDANT STAPHYLOCOCCUS AUREUS  Anaerobic culture     Status: None   Collection Time: 08/10/14  9:14 PM  Result Value Ref Range Status   Specimen Description FLUID LEFT AC JOINT  Final   Special Requests VANCO, ROCEPHIN  Final   Gram Stain   Final    FEW WBC PRESENT, PREDOMINANTLY PMN MODERATE GRAM POSITIVE COCCI IN PAIRS IN CLUSTERS Performed at [facility]    Culture   Final    NO ANAEROBES ISOLATED Performed at [facility]    Report Status 08/15/2014 FINAL  Final  Culture, blood (routine x 2)     Status: None   Collection Time: 08/11/14  7:00 AM  Result Value Ref Range Status   Specimen Description BLOOD RIGHT ARM  Final   Special Requests BOTTLES DRAWN AEROBIC AND ANAEROBIC 6CC  Final   Culture   Final    NO GROWTH 5 DAYS Performed at [facility]    Report Status 08/17/2014 FINAL  Final  Culture, blood (routine x 2)     Status: None   Collection Time: 08/11/14  7:08 AM  Result Value Ref Range Status   Specimen Description BLOOD LEFT HAND  Final   Special Requests BOTTLES DRAWN AEROBIC AND ANAEROBIC [place]  Final   Culture   Final    NO GROWTH 5 DAYS Performed at [facility]    Report Status 08/17/2014 FINAL  Final  Culture, Urine     Status: None   Collection Time: 08/12/14  5:47 AM  Result Value Ref Range Status   Specimen Description URINE, RANDOM  Final   Special Requests NONE  Final   Colony Count   Final    3,000 COLONIES/ML Performed at [place]   Final  INSIGNIFICANT GROWTH Performed at [facility]    Report Status 08/13/2014 FINAL  Final  Culture, blood (routine x 2)     Status: None   Collection Time: 08/15/14  1:15 PM  Result Value Ref Range Status   Specimen Description BLOOD RIGHT HAND  Final    Special Requests BOTTLES DRAWN AEROBIC ONLY 3CC  Final   Culture   Final    NO GROWTH 5 DAYS Performed at [facility]    Report Status 08/21/2014 FINAL  Final  Culture, blood (routine x 2)     Status: None   Collection Time: 08/15/14  3:11 PM  Result Value Ref Range Status   Specimen Description BLOOD RIGHT HAND  Final   Special Requests   Final    BOTTLES DRAWN AEROBIC AND ANAEROBIC BLUE 10CC RED 5CC   Culture   Final    NO GROWTH 5 DAYS Performed at [facility]    Report Status 08/21/2014 FINAL  Final  Culture, Urine     Status: None   Collection Time: 08/17/14  3:14 PM  Result Value Ref Range Status   Specimen Description URINE, CATHETERIZED  Final   Special Requests NONE  Final   Colony Count NO GROWTH Performed at [facility]   Final   Culture NO GROWTH Performed at [facility]   Final   Report Status 08/18/2014 FINAL  Final  Body fluid culture     Status: None   Collection Time: 08/18/14  4:52 PM  Result Value Ref Range Status   Specimen Description FLUID SYNOVIAL LEFT KNEE  Final   Special Requests Normal  Final   Gram Stain   Final    MODERATE WBC PRESENT,BOTH PMN AND MONONUCLEAR NO ORGANISMS SEEN Performed at [facility]    Culture   Final    NO GROWTH 3 DAYS Performed at [facility]    Report Status 08/22/2014 FINAL  Final    Anti-infectives    Start     Dose/Rate Route Frequency Ordered Stop   08/29/14 0900  ceFAZolin (ANCEF) IVPB 2 g/50 mL premix     2 g 100 mL/hr over 30 Minutes Intravenous  Once 08/29/14 0854 08/29/14 0925   08/29/14 0802  ceFAZolin (ANCEF) 2-3 GM-% IVPB SOLR  Status:  Discontinued    Comments:  Laughlin, Amy   : cabinet override      08/29/14 0802 08/29/14 0815   08/28/14 2200  ceFAZolin (ANCEF) IVPB 2 g/50 mL premix     2 g 100 mL/hr over 30 Minutes Intravenous Every M-W-F (Hemodialysis) 08/28/14 1501     08/28/14 1200  ceFAZolin (ANCEF) IVPB 2 g/50 mL premix  Status:   Discontinued    Comments:  On call to xray 3/14   2 g 100 mL/hr over 30 Minutes Intravenous On call 08/28/14 0924 08/28/14 1446   08/23/14 2200  fluconazole (DIFLUCAN) tablet 150 mg     150 mg Oral  Once 08/23/14 2151 08/23/14 2252   08/19/14 0500  ceFAZolin (ANCEF) IVPB 2 g/50 mL premix  Status:  Discontinued     2 g 100 mL/hr over 30 Minutes Intravenous Every 12 hours 08/18/14 1524 08/28/14 1501   08/18/14 1800  ceFAZolin (ANCEF) IVPB 2 g/50 mL premix  Status:  Discontinued     2 g 100 mL/hr over 30 Minutes Intravenous Every 12 hours 08/18/14 1517 08/18/14 1524   08/18/14 1330  ceFAZolin (ANCEF) 2-3 GM-% IVPB SOLR  Comments:  Barley, Jenny   : cabinet override      08/18/14 1330 08/19/14 0144   08/18/14 0600  ceFAZolin (ANCEF) IVPB 1 g/50 mL premix  Status:  Discontinued     1 g 100 mL/hr over 30 Minutes Intravenous Every 12 hours 08/17/14 1822 08/18/14 1517   08/16/14 1800  ceFAZolin (ANCEF) IVPB 2 g/50 mL premix  Status:  Discontinued     2 g 100 mL/hr over 30 Minutes Intravenous Every 12 hours 08/16/14 1355 08/17/14 1822   08/15/14 1200  nafcillin 2 g in dextrose 5 % 50 mL IVPB  Status:  Discontinued     2 g 100 mL/hr over 30 Minutes Intravenous 6 times per day 08/15/14 1028 08/16/14 1253   08/14/14 1400  ceFAZolin (ANCEF) IVPB 2 g/50 mL premix  Status:  Discontinued     2 g 100 mL/hr over 30 Minutes Intravenous 3 times per day 08/14/14 0850 08/15/14 1028   08/12/14 1800  vancomycin (VANCOCIN) 500 mg in sodium chloride 0.9 % 100 mL IVPB  Status:  Discontinued     500 mg 100 mL/hr over 60 Minutes Intravenous Every 12 hours 08/12/14 0705 08/12/14 0721   08/12/14 1800  vancomycin (VANCOCIN) IVPB 750 mg/150 ml premix  Status:  Discontinued     750 mg 150 mL/hr over 60 Minutes Intravenous Every 12 hours 08/12/14 0721 08/12/14 1315   08/10/14 2138  vancomycin (VANCOCIN) powder  Status:  Discontinued       As needed 08/10/14 2138 08/10/14 2153   08/10/14 1600  cefTRIAXone  (ROCEPHIN) 2 g in dextrose 5 % 50 mL IVPB - Premix  Status:  Discontinued     2 g 100 mL/hr over 30 Minutes Intravenous Every 24 hours 08/09/14 1555 08/10/14 1432   08/10/14 1600  ceFAZolin (ANCEF) IVPB 2 g/50 mL premix  Status:  Discontinued     2 g 100 mL/hr over 30 Minutes Intravenous 3 times per day 08/10/14 1432 08/14/14 0845   08/10/14 0600  vancomycin (VANCOCIN) IVPB 750 mg/150 ml premix  Status:  Discontinued     750 mg 150 mL/hr over 60 Minutes Intravenous Every 12 hours 08/09/14 2018 08/12/14 0705   08/09/14 1800  vancomycin (VANCOCIN) 2,000 mg in sodium chloride 0.9 % 500 mL IVPB     2,000 mg 250 mL/hr over 120 Minutes Intravenous  Once 08/09/14 1616 08/09/14 1929   08/09/14 1630  cefTRIAXone (ROCEPHIN) 2 g in dextrose 5 % 50 mL IVPB - Premix     2 g 100 mL/hr over 30 Minutes Intravenous  Once 08/09/14 1553 08/09/14 1653      Assessment: 58yoF admitted with intractable should pain, found to have disseminated MSSA bacteremia . Patient continues on ancef therapy. ID on board, planning for 8 weeks of therapy (end date 4/21). Patient with ARF, started on HD this admission. The plan is to continue with MWF HD session until patient experiences renal recovery. Tunnel cath was inserted yesterday and the patient received an extra dose of cefazolin pre-op as surgical prophylaxis.  Although patient is going for HD today, due to the extra dose of antibiotics yesterday, today's dose of Ancef will be held. Patient remains afebrile, WBC wnl.   Goal of Therapy:  Eradication of infection               Plan:  Hold today's dose of Ancef Continue Ancef 2g IV qHD starting 3/18 Monitor HD schedule Monitor UOP for recovery of renal function  Thank you  for allowing pharmacy to be part of this patient's care team  Jyles Sontag M. Gunther Zawadzki, Pharm.D Clinical Pharmacy Resident Pager: [phone] 08/30/2014 .2:08 PM

## 2014-08-31 LAB — PROCALCITONIN: Procalcitonin: 0.65 ng/mL

## 2014-08-31 LAB — CBC
HCT: 25.3 % — ABNORMAL LOW (ref 36.0–46.0)
Hemoglobin: 8 g/dL — ABNORMAL LOW (ref 12.0–15.0)
MCH: 29.5 pg (ref 26.0–34.0)
MCHC: 31.6 g/dL (ref 30.0–36.0)
MCV: 93.4 fL (ref 78.0–100.0)
Platelets: 378 10*3/uL (ref 150–400)
RBC: 2.71 MIL/uL — ABNORMAL LOW (ref 3.87–5.11)
RDW: 15.6 % — ABNORMAL HIGH (ref 11.5–15.5)
WBC: 12.6 10*3/uL — ABNORMAL HIGH (ref 4.0–10.5)

## 2014-08-31 LAB — BASIC METABOLIC PANEL
Anion gap: 9 (ref 5–15)
BUN: 14 mg/dL (ref 6–23)
CO2: 27 mmol/L (ref 19–32)
Calcium: 7.9 mg/dL — ABNORMAL LOW (ref 8.4–10.5)
Chloride: 96 mmol/L (ref 96–112)
Creatinine, Ser: 4.72 mg/dL — ABNORMAL HIGH (ref 0.50–1.10)
GFR calc Af Amer: 11 mL/min — ABNORMAL LOW (ref >90)
GFR calc non Af Amer: 9 mL/min — ABNORMAL LOW (ref >90)
Glucose, Bld: 160 mg/dL — ABNORMAL HIGH (ref 70–99)
Potassium: 3.7 mmol/L (ref 3.5–5.1)
Sodium: 132 mmol/L — ABNORMAL LOW (ref 135–145)

## 2014-08-31 LAB — GLUCOSE, CAPILLARY
Glucose-Capillary: 116 mg/dL — ABNORMAL HIGH (ref 70–99)
Glucose-Capillary: 169 mg/dL — ABNORMAL HIGH (ref 70–99)
Glucose-Capillary: 147 mg/dL — ABNORMAL HIGH (ref 70–99)
Glucose-Capillary: 132 mg/dL — ABNORMAL HIGH (ref 70–99)

## 2014-08-31 MED ORDER — OXYCODONE HCL 5 MG PO TABS
5.0000 mg | ORAL_TABLET | Freq: Once | ORAL | Status: AC
  Administered 2014-08-31: 5 mg via ORAL
  Filled 2014-08-31: qty 1

## 2014-08-31 NOTE — Progress Notes (Signed)
Subjective: Interval History: has complaints still puffy.  discouraged.  Objective: Vital signs in last 24 hours: Temp:  [98.4 F (36.9 C)-99.4 F (37.4 C)] 98.8 F (37.1 C) (03/17 0409) Pulse Rate:  [88-102] 101 (03/17 0409) Resp:  [18-20] 18 (03/17 0409) BP: (136-166)/(69-101) 154/72 mmHg (03/17 0409) SpO2:  [93 %-96 %] 93 % (03/17 0409) Weight change:   Intake/Output from previous day: 03/16 0701 - 03/17 0700 In: 660 [P.O.:660] Out: 4001 [Stool:1] Intake/Output this shift: Total I/O In: 360 [P.O.:360] Out: -   General appearance: alert, cooperative and moderately obese Resp: diminished breath sounds bilaterally and rales bibasilar Chest wall: RIJ cath Cardio: S1, S2 normal and systolic murmur: holosystolic 2/6, blowing at apex GI: obese, pos bs, soft.   Extremities: edema 3-4 + edema  Lab Results:  Recent Labs  08/28/14 1620  HGB 8.7*  HCT 26.1*   BMET:  Recent Labs  08/29/14 0415 08/30/14 0515  NA 135 132*  K 4.1 4.4  CL 99 99  CO2 25 25  GLUCOSE 107* 99  BUN 28* 33*  CREATININE 5.26* 6.62*  CALCIUM 7.5* 7.9*   No results for input(s): PTH in the last 72 hours. Iron Studies: No results for input(s): IRON, TIBC, TRANSFERRIN, FERRITIN in the last 72 hours.  Studies/Results: Dg Abd Portable 1v  08/29/2014   CLINICAL DATA:  Nausea vomiting today.  EXAM: PORTABLE ABDOMEN - 1 VIEW  COMPARISON:  01/03/2015  FINDINGS: There are small amounts of colonic contrast. Numerous surgical clips are identified in the right upper quadrant and right lower quadrant. Bowel gas pattern is nonobstructive. No evidence for free intraperitoneal air.  IMPRESSION: Residual small amounts of contrast. Correlation with timing of previous contrast exam is recommended. No recent exams have been performed with oral contrast in the [facility] system.   Electronically Signed   By: Nolon Nations M.D.   On: 08/29/2014 19:32    I have reviewed the patient's current  medications.  Assessment/Plan: 1 AKI no recovery.  Vol xs,HD in am. AIN and AGN 2 Anemia 3 Obesity 4 Septic joints 5 DM controlled 6 HTN lower vol P HD, lower vol, follow pre HD Cr    LOS: 24 days   Evertte Sones L 08/31/2014,10:52 AM

## 2014-08-31 NOTE — Progress Notes (Addendum)
[facility] PROGRESS NOTE  Brandy Houston [id number] DOB: 1955-12-11 DOA: 08/07/2014 PCP: Chari Manning, NP Interim summary: 59 year old lady with multiple medical problems initially admitted for left shoulder pain, further work up revealed MSSA bacteremia with septic arthritis of the numerous joints.  She underwent arthroscopic irrigation and debridement of the septic arthritis.  TEE is negative for endocarditis.  She was started on ancef by ID and recommended 8 weeks of treatment. Meanwhile her renal function worsened and renal consulted.  She was started on HD sessions.   Assessment/Plan: 1.  MSSA BACTEREMIA with septic arthritis: Currently on cefazolin to continue a total of 8 weeks of treatment. She is afebrile and has new leukocytosis on labs today.  Would check her WBC count again tomorrow and watch her counts. Get procalcitonin.  ID and orthopedics on board.  Pain control and Physical therapy.    2. 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  Further management as per renal. Creatinine is improving.   Diabetes mellitus; CBG (last 3)   Recent Labs  08/30/14 2159 08/31/14 0806 08/31/14 1143  GLUCAP 149* 116* 169*    Good control of blood sugars.  Resume SSI. No changes in diabetes management.   3. Hepatitis C antibody positive: further management with ID as outpatient.    Anemia; Anemia probably from chronic disease.  epo as per renal.  Repeat hemoglobin is around 8.   Severe hypoalbuminemia: Nutrition supplementation.    Tobacco abuse: Counseling given.     Hypertension: Not well controlled. Increase metoprolol to 75 mg bid.   Mild hyponatremia; HD should correct it.   Urinary retention: Bladder scan done showed about 235 ml of urine. And she underwent in and out cath for now.     Code Status: full c ode.  Family Communication: none at bedside Disposition Plan:  pending, plan for SNF when her renal function improves.    Consultants:  Renal  ID  Orthopedics.     Procedures:  HD   Arthroscopic irrigation and debridement of septic arthritis of left knee on 3/5   Arthroscopic extensive synovectomy in all 3 compartments of the left knee ON 3/5  Renal biopsy done on march 4 th 2016 and was found to have C3  Acute diffuse proliferative glomerulonephritis.   Antibiotics:  Ancef. Day 21/56  HPI/Subjective: Feeling the same as yesterday. Has some lower abdominal discomfort, . Hasn't urinated yesterday.   Objective: Filed Vitals:   08/31/14 0409  BP: 154/72  Pulse: 101  Temp: 98.8 F (37.1 C)  Resp: 18    Intake/Output Summary (Last 24 hours) at 08/31/14 1342 Last data filed at 08/31/14 1233  Gross per 24 hour  Intake    670 ml  Output   4150 ml  Net  -3480 ml   Filed Weights   08/26/14 2150 08/27/14 2053 08/28/14 0633  Weight: 112.129 kg (247 lb 3.2 oz) 112.5 kg (248 lb 0.3 oz) 108.6 kg (239 lb 6.7 oz)    Exam:   General:  Alert afebrile comfortable  Cardiovascular: s1s2, tachycardic.   Respiratory: diminished air entry at bases, no wheezing or rhonchi  Abdomen: soft non tender non distended bowel sounds heard  Musculoskeletal: leg edema 2+  Data Reviewed: Basic Metabolic Panel:  Recent Labs Lab 08/26/14 0550 08/27/14 0545 08/28/14 0700 08/29/14 0415 08/30/14 0515 08/31/14 1141  NA 133* 131* 130* 135 132* 132*  K 3.8 3.6 4.0 4.1 4.4 3.7  CL 98 97 96 99 99 96  CO2 23 23 28 25 25 27   GLUCOSE 129* 121* 114* 107* 99 160*  BUN 39* 44* 51* 28* 33* 14  CREATININE 6.49* 7.05* 7.62* 5.26* 6.62* 4.72*  CALCIUM 8.0* 7.8* 7.8* 7.5* 7.9* 7.9*  PHOS 6.2* 6.7* 7.1* 4.7* 5.4*  --    Liver Function Tests:  Recent Labs Lab 08/27/14 0545 08/28/14 0700 08/28/14 1953 08/29/14 0415 08/30/14 0515  AST  --   --  12  --   --   ALT  --   --  <5  --   --   ALKPHOS  --   --  100  --   --   BILITOT  --   --  0.7  --    --   PROT  --   --  6.7  --   --   ALBUMIN 1.1* 1.1* 1.2* 1.2* 1.2*   No results for input(s): LIPASE, AMYLASE in the last 168 hours. No results for input(s): AMMONIA in the last 168 hours. CBC:  Recent Labs Lab 08/25/14 0550 08/26/14 0550 08/27/14 0545 08/28/14 0700 08/28/14 1620 08/31/14 1141  WBC 11.2* 10.2 9.5 9.9  --  12.6*  HGB 7.2* 7.5* 7.1* 7.4* 8.7* 8.0*  HCT 21.5* 22.7* 21.9* 22.6* 26.1* 25.3*  MCV 90.0 90.1 90.1 89.3  --  93.4  PLT 474* 457* 434* 476*  --  378   Cardiac Enzymes: No results for input(s): CKTOTAL, CKMB, CKMBINDEX, TROPONINI in the last 168 hours. BNP (last 3 results) No results for input(s): BNP in the last 8760 hours.  ProBNP (last 3 results) No results for input(s): PROBNP in the last 8760 hours.  CBG:  Recent Labs Lab 08/30/14 1139 08/30/14 1830 08/30/14 2159 08/31/14 0806 08/31/14 1143  GLUCAP 118* 101* 149* 116* 169*    No results found for this or any previous visit (from the past 240 hour(s)).   Studies: Dg Abd Portable 1v  08/29/2014   CLINICAL DATA:  Nausea vomiting today.  EXAM: PORTABLE ABDOMEN - 1 VIEW  COMPARISON:  01/03/2015  FINDINGS: There are small amounts of colonic contrast. Numerous surgical clips are identified in the right upper quadrant and right lower quadrant. Bowel gas pattern is nonobstructive. No evidence for free intraperitoneal air.  IMPRESSION: Residual small amounts of contrast. Correlation with timing of previous contrast exam is recommended. No recent exams have been performed with oral contrast in the [facility] system.   Electronically Signed   By: Nolon Nations M.D.   On: 08/29/2014 19:32    Scheduled Meds: . antiseptic oral rinse  7 mL Mouth Rinse q12n4p  . aspirin  81 mg Oral Daily  .  ceFAZolin (ANCEF) IV  2 g Intravenous Q M,W,F-HD  . chlorhexidine  15 mL Mouth Rinse BID  . cyclobenzaprine  5 mg Oral BID  . [START ON 09/04/2014] darbepoetin (ARANESP) injection - DIALYSIS  100 mcg Intravenous Q  Mon-HD  . famotidine  20 mg Oral Daily  . ferumoxytol  510 mg Intravenous Weekly  . heparin subcutaneous  5,000 Units Subcutaneous 3 times per day  . hydrALAZINE  25 mg Oral TID  . insulin aspart  0-9 Units Subcutaneous TID WC  . lanthanum  1,000 mg Oral TID WC  . metoprolol tartrate  75 mg Oral BID  . nicotine  21 mg Transdermal Daily  . polyethylene glycol  17 g Oral BID  . sodium chloride  10-40 mL Intracatheter Q12H  . sodium  chloride  10-40 mL Intracatheter Q12H  . sodium chloride  3 mL Intravenous Q12H   Continuous Infusions:   Principal Problem:   Staphylococcus aureus bacteremia Active Problems:   Septic joint of left shoulder region   Sinus tachycardia   Septic joint of right knee joint   CKD (chronic kidney disease) stage 3, GFR 30-59 ml/min   Septic joint of right hand   SIRS (systemic inflammatory response syndrome)   Benign essential HTN   Hepatitis C   Acute renal failure   Acute renal failure syndrome   Hep C w/ coma, chronic   Joint infection    Time spent: 30 minutes.     [facility] Pager [phone] If 7PM-7AM, please contact night-coverage at [email or web address], password [facility] 08/31/2014, 1:42 PM  LOS: 24 days

## 2014-08-31 NOTE — Progress Notes (Signed)
Physical Therapy Treatment Patient Details Name: Brandy Houston MRN: [id number] DOB: 08-29-1955 Today's Date: 08/31/2014    History of Present Illness MSSA bacteremia with Polyarticular septic arthrits  s/p I&D of BUE's AC joint, BLE knee joint, and right hand    PT Comments    Pt continues to progress towards physical therapy goals. Was able to improve ambulation distance, however was not able to make corrective changes to gait pattern with cueing. Pt "stuck" in taking very small steps with trunk flexed. Will continue to follow and progress as able per POC.   Follow Up Recommendations  SNF     Equipment Recommendations  None recommended by PT    Recommendations for Other Services       Precautions / Restrictions Precautions Precautions: Fall Restrictions Weight Bearing Restrictions: Yes RLE Weight Bearing: Weight bearing as tolerated LLE Weight Bearing: Weight bearing as tolerated    Mobility  Bed Mobility Overal bed mobility: Needs Assistance Bed Mobility: Supine to Sit     Supine to sit: Min assist     General bed mobility comments: Pt reaching for therapist's hand to pull up to sitting. Use of bed rails for support.   Transfers Overall transfer level: Needs assistance Equipment used: Rolling walker (2 wheeled) Transfers: Sit to/from Stand Sit to Stand: Min assist;+2 physical assistance;From elevated surface         General transfer comment: Pt was able to power-up to full standing with +2 assist. Increased time required to gain standing balance - LLE buckling and pt losing balance posteriorly.   Ambulation/Gait Ambulation/Gait assistance: Min assist Ambulation Distance (Feet): 15 Feet Assistive device: Rolling walker (2 wheeled) Gait Pattern/deviations: Step-to pattern;Decreased stride length;Trunk flexed Gait velocity: Decreased Gait velocity interpretation: Below normal speed for age/gender General Gait Details: Pt was able to ambulate increased distance  this session with RW. Close chair follow utilized. Pt taking many small steps however only gained ~15 feet of distance. Pt was cued for increased step length however was unable to make corrective changes.   Stairs            Wheelchair Mobility    Modified Rankin (Stroke Patients Only)       Balance Overall balance assessment: Needs assistance Sitting-balance support: Feet supported;No upper extremity supported Sitting balance-Leahy Scale: Good     Standing balance support: Bilateral upper extremity supported;During functional activity Standing balance-Leahy Scale: Poor                      Cognition Arousal/Alertness: Awake/alert Behavior During Therapy: WFL for tasks assessed/performed Overall Cognitive Status: Within Functional Limits for tasks assessed                      Exercises General Exercises - Lower Extremity Quad Sets: 5 reps Long Arc Quad: 15 reps    General Comments General comments (skin integrity, edema, etc.): Therapist rewrapped knee as bandage had shifted and was cutting off pt's circulation.       Pertinent Vitals/Pain Pain Assessment: Faces Faces Pain Scale: Hurts even more Pain Location: knees and rightt shoulder Pain Descriptors / Indicators: Grimacing;Guarding Pain Intervention(s): Limited activity within patient's tolerance;Monitored during session;Repositioned    Home Living                      Prior Function            PT Goals (current goals can now be found in the care plan section)  Acute Rehab PT Goals Patient Stated Goal: go home, feel better PT Goal Formulation: With patient Time For Goal Achievement: 09/03/14 Potential to Achieve Goals: Good Progress towards PT goals: Progressing toward goals    Frequency  Min 2X/week    PT Plan Current plan remains appropriate    Co-evaluation             End of Session Equipment Utilized During Treatment: Gait belt Activity Tolerance: Patient  limited by pain;Patient limited by fatigue Patient left: in chair;with call bell/phone within reach     Time: 1105-1130 PT Time Calculation (min) (ACUTE ONLY): 25 min  Charges:  $Gait Training: 8-22 mins $Therapeutic Activity: 8-22 mins                    G Codes:      Rolinda Roan Sep 05, 2014, 12:43 PM  Rolinda Roan, PT, DPT Acute Rehabilitation Services Pager: [phone]

## 2014-08-31 NOTE — Progress Notes (Signed)
Bladder scanned pt 226ml shown in the bladder. MD made aware. New orders given for in and cath. Obtained 169ml of reddish brown urine. MD notified. Will continue to monitor pt.   Carole Civil, RN

## 2014-09-01 LAB — RENAL FUNCTION PANEL
Albumin: 1.3 g/dL — ABNORMAL LOW (ref 3.5–5.2)
Anion gap: 9 (ref 5–15)
BUN: 19 mg/dL (ref 6–23)
CO2: 27 mmol/L (ref 19–32)
Calcium: 8.2 mg/dL — ABNORMAL LOW (ref 8.4–10.5)
Chloride: 97 mmol/L (ref 96–112)
Creatinine, Ser: 5.98 mg/dL — ABNORMAL HIGH (ref 0.50–1.10)
GFR calc Af Amer: 8 mL/min — ABNORMAL LOW (ref >90)
GFR calc non Af Amer: 7 mL/min — ABNORMAL LOW (ref >90)
Glucose, Bld: 114 mg/dL — ABNORMAL HIGH (ref 70–99)
Phosphorus: 4.4 mg/dL (ref 2.3–4.6)
Potassium: 4 mmol/L (ref 3.5–5.1)
Sodium: 133 mmol/L — ABNORMAL LOW (ref 135–145)

## 2014-09-01 LAB — BASIC METABOLIC PANEL
Anion gap: 8 (ref 5–15)
BUN: 18 mg/dL (ref 6–23)
CO2: 28 mmol/L (ref 19–32)
Calcium: 8.1 mg/dL — ABNORMAL LOW (ref 8.4–10.5)
Chloride: 97 mmol/L (ref 96–112)
Creatinine, Ser: 5.94 mg/dL — ABNORMAL HIGH (ref 0.50–1.10)
GFR calc Af Amer: 8 mL/min — ABNORMAL LOW (ref >90)
GFR calc non Af Amer: 7 mL/min — ABNORMAL LOW (ref >90)
Glucose, Bld: 121 mg/dL — ABNORMAL HIGH (ref 70–99)
Potassium: 3.8 mmol/L (ref 3.5–5.1)
Sodium: 133 mmol/L — ABNORMAL LOW (ref 135–145)

## 2014-09-01 LAB — CBC
HCT: 25.5 % — ABNORMAL LOW (ref 36.0–46.0)
Hemoglobin: 8.1 g/dL — ABNORMAL LOW (ref 12.0–15.0)
MCH: 30.2 pg (ref 26.0–34.0)
MCHC: 31.8 g/dL (ref 30.0–36.0)
MCV: 95.1 fL (ref 78.0–100.0)
Platelets: 343 10*3/uL (ref 150–400)
RBC: 2.68 MIL/uL — ABNORMAL LOW (ref 3.87–5.11)
RDW: 15.8 % — ABNORMAL HIGH (ref 11.5–15.5)
WBC: 12.4 10*3/uL — ABNORMAL HIGH (ref 4.0–10.5)

## 2014-09-01 LAB — GLUCOSE, CAPILLARY
Glucose-Capillary: 182 mg/dL — ABNORMAL HIGH (ref 70–99)
Glucose-Capillary: 132 mg/dL — ABNORMAL HIGH (ref 70–99)
Glucose-Capillary: 154 mg/dL — ABNORMAL HIGH (ref 70–99)

## 2014-09-01 LAB — PROCALCITONIN: Procalcitonin: 0.54 ng/mL

## 2014-09-01 MED ORDER — SODIUM CHLORIDE 0.9 % IV SOLN: 100.0000 mL | INTRAVENOUS | Status: DC | PRN

## 2014-09-01 MED ORDER — CEFAZOLIN SODIUM 1-5 GM-% IV SOLN
1.0000 g | INTRAVENOUS | Status: AC
  Administered 2014-09-02 – 2014-09-14 (×13): 1 g via INTRAVENOUS
  Filled 2014-09-01 (×16): qty 50

## 2014-09-01 MED ORDER — HEPARIN SODIUM (PORCINE) 1000 UNIT/ML DIALYSIS: 1000.0000 [IU] | INTRAMUSCULAR | Status: DC | PRN

## 2014-09-01 MED ORDER — PENTAFLUOROPROP-TETRAFLUOROETH EX AERO: 1.0000 "application " | INHALATION_SPRAY | CUTANEOUS | Status: DC | PRN

## 2014-09-01 MED ORDER — LIDOCAINE-PRILOCAINE 2.5-2.5 % EX CREA: 1.0000 "application " | TOPICAL_CREAM | CUTANEOUS | Status: DC | PRN

## 2014-09-01 MED ORDER — NEPRO/CARBSTEADY PO LIQD
237.0000 mL | ORAL | Status: DC | PRN
  Filled 2014-09-01: qty 237

## 2014-09-01 MED ORDER — ALTEPLASE 2 MG IJ SOLR: 2.0000 mg | Freq: Once | INTRAMUSCULAR | Status: DC | PRN

## 2014-09-01 MED ORDER — HEPARIN SODIUM (PORCINE) 1000 UNIT/ML DIALYSIS
100.0000 [IU]/kg | INTRAMUSCULAR | Status: DC | PRN
  Filled 2014-09-01: qty 11

## 2014-09-01 MED ORDER — NEPRO/CARBSTEADY PO LIQD: 237.0000 mL | ORAL | Status: DC | PRN

## 2014-09-01 MED ORDER — LIDOCAINE HCL (PF) 1 % IJ SOLN: 5.0000 mL | INTRAMUSCULAR | Status: DC | PRN

## 2014-09-01 MED ORDER — ALTEPLASE 2 MG IJ SOLR
2.0000 mg | Freq: Once | INTRAMUSCULAR | Status: DC | PRN
  Filled 2014-09-01: qty 2

## 2014-09-01 MED ORDER — HYDROCODONE-ACETAMINOPHEN 5-325 MG PO TABS
ORAL_TABLET | ORAL | Status: AC
  Filled 2014-09-01: qty 1

## 2014-09-01 MED ORDER — LIDOCAINE-PRILOCAINE 2.5-2.5 % EX CREA
1.0000 "application " | TOPICAL_CREAM | CUTANEOUS | Status: DC | PRN
  Filled 2014-09-01: qty 5

## 2014-09-01 NOTE — Progress Notes (Signed)
Subjective: Interval History: has complaints ,swollen, full.  Objective: Vital signs in last 24 hours: Temp:  [98 F (36.7 C)-99.6 F (37.6 C)] 98 F (36.7 C) (03/18 0645) Pulse Rate:  [84-99] 91 (03/18 0858) Resp:  [16-18] 16 (03/18 0516) BP: (140-163)/(74-99) 163/88 mmHg (03/18 0858) SpO2:  [94 %-96 %] 96 % (03/18 0645) Weight:  [101.6 kg (223 lb 15.8 oz)-102.014 kg (224 lb 14.4 oz)] 101.6 kg (223 lb 15.8 oz) (03/18 0656) Weight change:   Intake/Output from previous day: 03/17 0701 - 03/18 0700 In: 969 [P.O.:939; I.V.:30] Out: 150 [Urine:150] Intake/Output this shift:    General appearance: alert, cooperative, no distress and moderately obese Resp: diminished breath sounds bilaterally and rales bibasilar Cardio: S1, S2 normal and systolic murmur: holosystolic 2/6, buzzing at apex GI: obese, striae, pos bs,liver down 5 cm Extremities: RIJ PC 3-4+ edema  Lab Results:  Recent Labs  08/31/14 1141 09/01/14 0708  WBC 12.6* 12.4*  HGB 8.0* 8.1*  HCT 25.3* 25.5*  PLT 378 343   BMET:  Recent Labs  09/01/14 0510 09/01/14 0708  NA 133* 133*  K 3.8 4.0  CL 97 97  CO2 28 27  GLUCOSE 121* 114*  BUN 18 19  CREATININE 5.94* 5.98*  CALCIUM 8.1* 8.2*   No results for input(s): PTH in the last 72 hours. Iron Studies: No results for input(s): IRON, TIBC, TRANSFERRIN, FERRITIN in the last 72 hours.  Studies/Results: No results found.  I have reviewed the patient's current medications.  Assessment/Plan: 1 AKI oliguric ATN. Vol xs, ^. Will do HD back to back. Lower vol 2 HTN lower vol 3 Staph sepsis MSSA on Ancef 4 Hep C 5 Septic joints. 6 Anemia stable P HD, AB, lower vol    LOS: 25 days   Masaichi Kracht L 09/01/2014,9:15 AM

## 2014-09-01 NOTE — Progress Notes (Signed)
ANTIBIOTIC CONSULT NOTE - FOLLOW UP  Pharmacy Consult for cefazolin Indication: disseminated MSSA infection  Allergies  Allergen Reactions  . Compazine [Prochlorperazine] Shortness Of Breath and Swelling    TONGUE SWELLS  . Omnipaque [Iohexol] Hives  . Shellfish-Derived Products Anaphylaxis  . Iodinated Diagnostic Agents Rash    Patient Measurements: Height: 5' 7.5" (171.5 cm) Weight: 215 lb 9.8 oz (97.8 kg) IBW/kg (Calculated) : 62.75  Vital Signs: Temp: 99.2 F (37.3 C) (03/18 1122) Temp Source: Oral (03/18 1122) BP: 146/73 mmHg (03/18 1122) Pulse Rate: 93 (03/18 1122) Intake/Output from previous day: 03/17 0701 - 03/18 0700 In: 969 [P.O.:939; I.V.:30] Out: 150 [Urine:150] Intake/Output from this shift: Total I/O In: 120 [P.O.:120] Out: 3927 [Other:3927]  Labs:  Recent Labs  08/31/14 1141 09/01/14 0510 09/01/14 0708  WBC 12.6*  --  12.4*  HGB 8.0*  --  8.1*  PLT 378  --  343  CREATININE 4.72* 5.94* 5.98*   Estimated Creatinine Clearance: 12.3 mL/min (by C-G formula based on Cr of 5.98). No results for input(s): VANCOTROUGH, VANCOPEAK, VANCORANDOM, GENTTROUGH, GENTPEAK, GENTRANDOM, TOBRATROUGH, TOBRAPEAK, TOBRARND, AMIKACINPEAK, AMIKACINTROU, AMIKACIN in the last 72 hours.   Microbiology: Recent Results (from the past 720 hour(s))  Culture, blood (routine x 2)     Status: None   Collection Time: 08/09/14 12:15 PM  Result Value Ref Range Status   Specimen Description BLOOD RIGHT HAND  Final   Special Requests   Final    BOTTLES DRAWN AEROBIC AND ANAEROBIC 7CCS BOTH BOTTLES   Culture   Final    STAPHYLOCOCCUS AUREUS Note: RIFAMPIN AND GENTAMICIN SHOULD NOT BE USED AS SINGLE DRUGS FOR TREATMENT OF STAPH INFECTIONS. Note: Gram Stain Report Called to,Read Back By and Verified With: Shirlean Kelly 700AM 08/10/14 [place] Performed at [facility]    Report Status 08/12/2014 FINAL  Final   Organism ID, Bacteria STAPHYLOCOCCUS AUREUS  Final       Susceptibility   Staphylococcus aureus - MIC*    CLINDAMYCIN <=0.25 SENSITIVE Sensitive     ERYTHROMYCIN <=0.25 SENSITIVE Sensitive     GENTAMICIN <=0.5 SENSITIVE Sensitive     LEVOFLOXACIN 0.25 SENSITIVE Sensitive     OXACILLIN <=0.25 SENSITIVE Sensitive     PENICILLIN >=0.5 RESISTANT Resistant     RIFAMPIN <=0.5 SENSITIVE Sensitive     TRIMETH/SULFA <=10 SENSITIVE Sensitive     VANCOMYCIN 1 SENSITIVE Sensitive     TETRACYCLINE <=1 SENSITIVE Sensitive     MOXIFLOXACIN <=0.25 SENSITIVE Sensitive     * STAPHYLOCOCCUS AUREUS  Culture, blood (routine x 2)     Status: None   Collection Time: 08/09/14 12:25 PM  Result Value Ref Range Status   Specimen Description BLOOD RIGHT ARM  Final   Special Requests   Final    BOTTLES DRAWN AEROBIC AND ANAEROBIC 10CC BOTH BOTTLES   Culture   Final    STAPHYLOCOCCUS AUREUS Note: SUSCEPTIBILITIES PERFORMED ON PREVIOUS CULTURE WITHIN THE LAST 5 DAYS. Note: Gram Stain Report Called to,Read Back By and Verified With: Shirlean Kelly 700AM 08/10/14 [place] Performed at [facility]    Report Status 08/12/2014 FINAL  Final  Culture, routine-abscess     Status: None   Collection Time: 08/10/14 11:54 AM  Result Value Ref Range Status   Specimen Description SHOULDER LEFT  Final   Special Requests NONE  Final   Gram Stain   Final    ABUNDANT WBC PRESENT, PREDOMINANTLY PMN NO SQUAMOUS EPITHELIAL CELLS SEEN MODERATE GRAM POSITIVE COCCI  IN PAIRS IN CLUSTERS Performed at [facility]    Culture   Final    MODERATE STAPHYLOCOCCUS AUREUS Note: RIFAMPIN AND GENTAMICIN SHOULD NOT BE USED AS SINGLE DRUGS FOR TREATMENT OF STAPH INFECTIONS. Performed at [facility]    Report Status 08/13/2014 FINAL  Final   Organism ID, Bacteria STAPHYLOCOCCUS AUREUS  Final      Susceptibility   Staphylococcus aureus - MIC*    CLINDAMYCIN <=0.25 SENSITIVE Sensitive     ERYTHROMYCIN <=0.25 SENSITIVE Sensitive     GENTAMICIN <=0.5 SENSITIVE  Sensitive     LEVOFLOXACIN 0.25 SENSITIVE Sensitive     OXACILLIN 0.5 SENSITIVE Sensitive     PENICILLIN >=0.5 RESISTANT Resistant     RIFAMPIN <=0.5 SENSITIVE Sensitive     TRIMETH/SULFA <=10 SENSITIVE Sensitive     VANCOMYCIN 1 SENSITIVE Sensitive     TETRACYCLINE <=1 SENSITIVE Sensitive     MOXIFLOXACIN <=0.25 SENSITIVE Sensitive     * MODERATE STAPHYLOCOCCUS AUREUS  Anaerobic culture     Status: None   Collection Time: 08/10/14 11:54 AM  Result Value Ref Range Status   Specimen Description SHOULDER LEFT  Final   Special Requests NONE  Final   Gram Stain   Final    MODERATE WBC PRESENT, PREDOMINANTLY PMN NO SQUAMOUS EPITHELIAL CELLS SEEN MODERATE GRAM POSITIVE COCCI IN PAIRS IN CLUSTERS Performed at [facility]    Culture   Final    NO ANAEROBES ISOLATED Note: NO PROPIONIBACTERIUM ISOLATED Performed at [facility]    Report Status 08/25/2014 FINAL  Final  Body fluid culture     Status: None   Collection Time: 08/10/14  5:22 PM  Result Value Ref Range Status   Specimen Description SYNOVIAL JOINT RIGHT KNEE  Final   Special Requests Normal  Final   Gram Stain   Final    ABUNDANT WBC PRESENT,BOTH PMN AND MONONUCLEAR NO ORGANISMS SEEN Performed at [facility]    Culture   Final    FEW STAPHYLOCOCCUS AUREUS Note: RIFAMPIN AND GENTAMICIN SHOULD NOT BE USED AS SINGLE DRUGS FOR TREATMENT OF STAPH INFECTIONS. CRITICAL RESULT CALLED TO, READ BACK BY AND VERIFIED WITH: CINDY FLORES RN 08/13/14 AT 42 AM BY [facility] Performed at [facility]    Report Status 08/14/2014 FINAL  Final   Organism ID, Bacteria STAPHYLOCOCCUS AUREUS  Final      Susceptibility   Staphylococcus aureus - MIC*    CLINDAMYCIN <=0.25 SENSITIVE Sensitive     ERYTHROMYCIN <=0.25 SENSITIVE Sensitive     GENTAMICIN <=0.5 SENSITIVE Sensitive     LEVOFLOXACIN 0.25 SENSITIVE Sensitive     OXACILLIN 0.5 SENSITIVE Sensitive     PENICILLIN >=0.5 RESISTANT Resistant     RIFAMPIN  <=0.5 SENSITIVE Sensitive     TRIMETH/SULFA <=10 SENSITIVE Sensitive     VANCOMYCIN <=0.5 SENSITIVE Sensitive     TETRACYCLINE <=1 SENSITIVE Sensitive     MOXIFLOXACIN <=0.25 SENSITIVE Sensitive     * FEW STAPHYLOCOCCUS AUREUS  Surgical pcr screen     Status: Abnormal   Collection Time: 08/10/14  6:42 PM  Result Value Ref Range Status   MRSA, PCR NEGATIVE NEGATIVE Final   Staphylococcus aureus POSITIVE (A) NEGATIVE Final    Comment:        The Xpert SA Assay (FDA approved for NASAL specimens in patients over 27 years of age), is one component of a comprehensive surveillance program.  Test performance has been validated by [facility] for patients greater  than or equal to 79 year old. It is not intended to diagnose infection nor to guide or monitor treatment.   Body fluid culture     Status: None   Collection Time: 08/10/14  9:14 PM  Result Value Ref Range Status   Specimen Description FLUID LEFT AC JOINT  Final   Special Requests VANCO, ROCEPHIN  Final   Gram Stain   Final    FEW WBC PRESENT, PREDOMINANTLY PMN MODERATE GRAM POSITIVE COCCI IN PAIRS IN CLUSTERS Gram Stain Report Called to,Read Back By and Verified With: Gram Stain Report Called to,Read Back By and Verified With: Laurance Flatten RN 08/11/13 9:10AM BY [place] Performed at [facility]    Culture   Final    ABUNDANT STAPHYLOCOCCUS AUREUS Note: RIFAMPIN AND GENTAMICIN SHOULD NOT BE USED AS SINGLE DRUGS FOR TREATMENT OF STAPH INFECTIONS. Performed at [facility]    Report Status 08/13/2014 FINAL  Final   Organism ID, Bacteria STAPHYLOCOCCUS AUREUS  Final      Susceptibility   Staphylococcus aureus - MIC*    CLINDAMYCIN <=0.25 SENSITIVE Sensitive     ERYTHROMYCIN <=0.25 SENSITIVE Sensitive     GENTAMICIN <=0.5 SENSITIVE Sensitive     LEVOFLOXACIN 0.25 SENSITIVE Sensitive     OXACILLIN <=0.25 SENSITIVE Sensitive     PENICILLIN >=0.5 RESISTANT Resistant     RIFAMPIN <=0.5 SENSITIVE Sensitive      TRIMETH/SULFA <=10 SENSITIVE Sensitive     VANCOMYCIN 1 SENSITIVE Sensitive     TETRACYCLINE <=1 SENSITIVE Sensitive     MOXIFLOXACIN <=0.25 SENSITIVE Sensitive     * ABUNDANT STAPHYLOCOCCUS AUREUS  Anaerobic culture     Status: None   Collection Time: 08/10/14  9:14 PM  Result Value Ref Range Status   Specimen Description FLUID LEFT AC JOINT  Final   Special Requests VANCO, ROCEPHIN  Final   Gram Stain   Final    FEW WBC PRESENT, PREDOMINANTLY PMN MODERATE GRAM POSITIVE COCCI IN PAIRS IN CLUSTERS Performed at [facility]    Culture   Final    NO ANAEROBES ISOLATED Performed at [facility]    Report Status 08/15/2014 FINAL  Final  Culture, blood (routine x 2)     Status: None   Collection Time: 08/11/14  7:00 AM  Result Value Ref Range Status   Specimen Description BLOOD RIGHT ARM  Final   Special Requests BOTTLES DRAWN AEROBIC AND ANAEROBIC 6CC  Final   Culture   Final    NO GROWTH 5 DAYS Performed at [facility]    Report Status 08/17/2014 FINAL  Final  Culture, blood (routine x 2)     Status: None   Collection Time: 08/11/14  7:08 AM  Result Value Ref Range Status   Specimen Description BLOOD LEFT HAND  Final   Special Requests BOTTLES DRAWN AEROBIC AND ANAEROBIC [place]  Final   Culture   Final    NO GROWTH 5 DAYS Performed at [facility]    Report Status 08/17/2014 FINAL  Final  Culture, Urine     Status: None   Collection Time: 08/12/14  5:47 AM  Result Value Ref Range Status   Specimen Description URINE, RANDOM  Final   Special Requests NONE  Final   Colony Count   Final    3,000 COLONIES/ML Performed at [facility]    Culture   Final    INSIGNIFICANT GROWTH Performed at [facility]    Report Status 08/13/2014  FINAL  Final  Culture, blood (routine x 2)     Status: None   Collection Time: 08/15/14  1:15 PM  Result Value Ref Range Status   Specimen Description BLOOD RIGHT HAND  Final   Special Requests  BOTTLES DRAWN AEROBIC ONLY 3CC  Final   Culture   Final    NO GROWTH 5 DAYS Performed at [facility]    Report Status 08/21/2014 FINAL  Final  Culture, blood (routine x 2)     Status: None   Collection Time: 08/15/14  3:11 PM  Result Value Ref Range Status   Specimen Description BLOOD RIGHT HAND  Final   Special Requests   Final    BOTTLES DRAWN AEROBIC AND ANAEROBIC BLUE 10CC RED 5CC   Culture   Final    NO GROWTH 5 DAYS Performed at [facility]    Report Status 08/21/2014 FINAL  Final  Culture, Urine     Status: None   Collection Time: 08/17/14  3:14 PM  Result Value Ref Range Status   Specimen Description URINE, CATHETERIZED  Final   Special Requests NONE  Final   Colony Count NO GROWTH Performed at [facility]   Final   Culture NO GROWTH Performed at [facility]   Final   Report Status 08/18/2014 FINAL  Final  Body fluid culture     Status: None   Collection Time: 08/18/14  4:52 PM  Result Value Ref Range Status   Specimen Description FLUID SYNOVIAL LEFT KNEE  Final   Special Requests Normal  Final   Gram Stain   Final    MODERATE WBC PRESENT,BOTH PMN AND MONONUCLEAR NO ORGANISMS SEEN Performed at [facility]    Culture   Final    NO GROWTH 3 DAYS Performed at [facility]    Report Status 08/22/2014 FINAL  Final    Anti-infectives    Start     Dose/Rate Route Frequency Ordered Stop   08/29/14 0900  ceFAZolin (ANCEF) IVPB 2 g/50 mL premix     2 g 100 mL/hr over 30 Minutes Intravenous  Once 08/29/14 0854 08/29/14 0925   08/29/14 0802  ceFAZolin (ANCEF) 2-3 GM-% IVPB SOLR  Status:  Discontinued    Comments:  Laughlin, Amy   : cabinet override      08/29/14 0802 08/29/14 0815   08/28/14 2200  ceFAZolin (ANCEF) IVPB 2 g/50 mL premix     2 g 100 mL/hr over 30 Minutes Intravenous Every M-W-F (Hemodialysis) 08/28/14 1501     08/28/14 1200  ceFAZolin (ANCEF) IVPB 2 g/50 mL premix  Status:  Discontinued     Comments:  On call to xray 3/14   2 g 100 mL/hr over 30 Minutes Intravenous On call 08/28/14 0924 08/28/14 1446   08/23/14 2200  fluconazole (DIFLUCAN) tablet 150 mg     150 mg Oral  Once 08/23/14 2151 08/23/14 2252   08/19/14 0500  ceFAZolin (ANCEF) IVPB 2 g/50 mL premix  Status:  Discontinued     2 g 100 mL/hr over 30 Minutes Intravenous Every 12 hours 08/18/14 1524 08/28/14 1501   08/18/14 1800  ceFAZolin (ANCEF) IVPB 2 g/50 mL premix  Status:  Discontinued     2 g 100 mL/hr over 30 Minutes Intravenous Every 12 hours 08/18/14 1517 08/18/14 1524   08/18/14 1330  ceFAZolin (ANCEF) 2-3 GM-% IVPB SOLR    Comments:  Barley, Jenny   : cabinet override  08/18/14 1330 08/19/14 0144   08/18/14 0600  ceFAZolin (ANCEF) IVPB 1 g/50 mL premix  Status:  Discontinued     1 g 100 mL/hr over 30 Minutes Intravenous Every 12 hours 08/17/14 1822 08/18/14 1517   08/16/14 1800  ceFAZolin (ANCEF) IVPB 2 g/50 mL premix  Status:  Discontinued     2 g 100 mL/hr over 30 Minutes Intravenous Every 12 hours 08/16/14 1355 08/17/14 1822   08/15/14 1200  nafcillin 2 g in dextrose 5 % 50 mL IVPB  Status:  Discontinued     2 g 100 mL/hr over 30 Minutes Intravenous 6 times per day 08/15/14 1028 08/16/14 1253   08/14/14 1400  ceFAZolin (ANCEF) IVPB 2 g/50 mL premix  Status:  Discontinued     2 g 100 mL/hr over 30 Minutes Intravenous 3 times per day 08/14/14 0850 08/15/14 1028   08/12/14 1800  vancomycin (VANCOCIN) 500 mg in sodium chloride 0.9 % 100 mL IVPB  Status:  Discontinued     500 mg 100 mL/hr over 60 Minutes Intravenous Every 12 hours 08/12/14 0705 08/12/14 0721   08/12/14 1800  vancomycin (VANCOCIN) IVPB 750 mg/150 ml premix  Status:  Discontinued     750 mg 150 mL/hr over 60 Minutes Intravenous Every 12 hours 08/12/14 0721 08/12/14 1315   08/10/14 2138  vancomycin (VANCOCIN) powder  Status:  Discontinued       As needed 08/10/14 2138 08/10/14 2153   08/10/14 1600  cefTRIAXone (ROCEPHIN) 2 g in  dextrose 5 % 50 mL IVPB - Premix  Status:  Discontinued     2 g 100 mL/hr over 30 Minutes Intravenous Every 24 hours 08/09/14 1555 08/10/14 1432   08/10/14 1600  ceFAZolin (ANCEF) IVPB 2 g/50 mL premix  Status:  Discontinued     2 g 100 mL/hr over 30 Minutes Intravenous 3 times per day 08/10/14 1432 08/14/14 0845   08/10/14 0600  vancomycin (VANCOCIN) IVPB 750 mg/150 ml premix  Status:  Discontinued     750 mg 150 mL/hr over 60 Minutes Intravenous Every 12 hours 08/09/14 2018 08/12/14 0705   08/09/14 1800  vancomycin (VANCOCIN) 2,000 mg in sodium chloride 0.9 % 500 mL IVPB     2,000 mg 250 mL/hr over 120 Minutes Intravenous  Once 08/09/14 1616 08/09/14 1929   08/09/14 1630  cefTRIAXone (ROCEPHIN) 2 g in dextrose 5 % 50 mL IVPB - Premix     2 g 100 mL/hr over 30 Minutes Intravenous  Once 08/09/14 1553 08/09/14 1653      Assessment: 1 yoF admitted with intractable shoulder pain, 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 Patient continues on Ancef, planning for 8 weeks of therapy (end date 4/21). Patient with ARF, started on HD this admission with original plan to continue with MWF HD sessions until patient experiences renal recovery. However, MD assessing daily for HD needs and due to the lack of consistent HD schedule, will adjust Ancef to renally adjusted dosing. Patient remains afebrile, WBC wnl.  Goal of Therapy:  Eradication of infection  Plan:  Change Ancef to 1 g IV q24h Monitor UOP for recovery of renal function Monitor clinical progress  Whitney Muse, PharmD Candidate 09/01/2014,11:45 AM

## 2014-09-01 NOTE — Progress Notes (Signed)
[facility] PROGRESS NOTE  [name] [id number] DOB: 1956/02/20 DOA: 08/07/2014 PCP: Chari Manning, NP Interim summary: 59 year old lady with multiple medical problems initially admitted for left shoulder pain, further work up revealed MSSA bacteremia with septic arthritis of the numerous joints.  She underwent arthroscopic irrigation and debridement of the septic arthritis.  TEE is negative for endocarditis.  She was started on ancef by ID and recommended 8 weeks of treatment. Meanwhile her renal function worsened and renal consulted.  She was started on HD sessions.   Assessment/Plan: 1.  MSSA BACTEREMIA with septic arthritis: Currently on cefazolin to continue a total of 8 weeks of treatment. She is afebrile but has persistent leukocytosis. procalcitonin levels are pending.  ID and orthopedics on board.  Pain control and Physical therapy.    2. 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 Still volume overloaded, requiring HD .  Further management as per renal. Creatinine is improving.   Diabetes mellitus; CBG (last 3)   Recent Labs  08/31/14 2137 09/01/14 1120 09/01/14 1650  GLUCAP 132* 182* 132*    Good control of blood sugars.  Resume SSI. No changes in diabetes management.   3. Hepatitis C antibody positive: further management with ID as outpatient.    Anemia; Anemia probably from chronic disease.  epo as per renal.  Repeat hemoglobin is around 8.   Severe hypoalbuminemia: Nutrition supplementation.    Tobacco abuse: Counseling given.     Hypertension: Not well controlled. Increased metoprolol to 75 mg bid. BP AND PR much better.   Mild hyponatremia; HD should correct it.   Urinary retention: Resolved.     Code Status: full code.  Family Communication: none at bedside Disposition Plan: pending, plan for SNF when her renal function improves.     Consultants:  Renal  ID  Orthopedics.     Procedures:  HD   Arthroscopic irrigation and debridement of septic arthritis of left knee on 3/5   Arthroscopic extensive synovectomy in all 3 compartments of the left knee ON 3/5  Renal biopsy done on march 4 th 2016 and was found to have C3  Acute diffuse proliferative glomerulonephritis.   Antibiotics:  Ancef. Day 21/56  HPI/Subjective: Reports urinating a little. Pain is better. No nausea or vomiting.   Objective: Filed Vitals:   09/01/14 1652  BP: 142/71  Pulse: 77  Temp: 99 F (37.2 C)  Resp: 17    Intake/Output Summary (Last 24 hours) at 09/01/14 1826 Last data filed at 09/01/14 1750  Gross per 24 hour  Intake    540 ml  Output   3927 ml  Net  -3387 ml   Filed Weights   08/31/14 2138 09/01/14 0656 09/01/14 1054  Weight: 102.014 kg (224 lb 14.4 oz) 101.6 kg (223 lb 15.8 oz) 97.8 kg (215 lb 9.8 oz)    Exam:   General:  Alert afebrile comfortable  Cardiovascular: s1s2, RRR no mrg.   Respiratory: diminished air entry at bases, no wheezing or rhonchi  Abdomen: soft non tender non distended bowel sounds heard  Musculoskeletal: leg edema 2+  Data Reviewed: Basic Metabolic Panel:  Recent Labs Lab 08/27/14 0545 08/28/14 0700 08/29/14 0415 08/30/14 0515 08/31/14 1141 09/01/14 0510 09/01/14 0708  NA 131* 130* 135 132* 132* 133* 133*  K 3.6 4.0 4.1 4.4 3.7 3.8 4.0  CL 97 96 99 99 96 97 97  CO2 23 28 25 25 27 28  27  GLUCOSE 121* 114* 107* 99 160* 121* 114*  BUN 44* 51* 28* 33* 14 18 19   CREATININE 7.05* 7.62* 5.26* 6.62* 4.72* 5.94* 5.98*  CALCIUM 7.8* 7.8* 7.5* 7.9* 7.9* 8.1* 8.2*  PHOS 6.7* 7.1* 4.7* 5.4*  --   --  4.4   Liver Function Tests:  Recent Labs Lab 08/28/14 0700 08/28/14 1953 08/29/14 0415 08/30/14 0515 09/01/14 0708  AST  --  12  --   --   --   ALT  --  <5  --   --   --   ALKPHOS  --  100  --   --   --   BILITOT  --  0.7  --   --   --   PROT  --  6.7  --   --   --    ALBUMIN 1.1* 1.2* 1.2* 1.2* 1.3*   No results for input(s): LIPASE, AMYLASE in the last 168 hours. No results for input(s): AMMONIA in the last 168 hours. CBC:  Recent Labs Lab 08/26/14 0550 08/27/14 0545 08/28/14 0700 08/28/14 1620 08/31/14 1141 09/01/14 0708  WBC 10.2 9.5 9.9  --  12.6* 12.4*  HGB 7.5* 7.1* 7.4* 8.7* 8.0* 8.1*  HCT 22.7* 21.9* 22.6* 26.1* 25.3* 25.5*  MCV 90.1 90.1 89.3  --  93.4 95.1  PLT 457* 434* 476*  --  378 343   Cardiac Enzymes: No results for input(s): CKTOTAL, CKMB, CKMBINDEX, TROPONINI in the last 168 hours. BNP (last 3 results) No results for input(s): BNP in the last 8760 hours.  ProBNP (last 3 results) No results for input(s): PROBNP in the last 8760 hours.  CBG:  Recent Labs Lab 08/31/14 1143 08/31/14 1635 08/31/14 2137 09/01/14 1120 09/01/14 1650  GLUCAP 169* 147* 132* 182* 132*    No results found for this or any previous visit (from the past 240 hour(s)).   Studies: No results found.  Scheduled Meds: . antiseptic oral rinse  7 mL Mouth Rinse q12n4p  . aspirin  81 mg Oral Daily  . [START ON 09/02/2014]  ceFAZolin (ANCEF) IV  1 g Intravenous Q24H  . chlorhexidine  15 mL Mouth Rinse BID  . cyclobenzaprine  5 mg Oral BID  . [START ON 09/04/2014] darbepoetin (ARANESP) injection - DIALYSIS  100 mcg Intravenous Q Mon-HD  . famotidine  20 mg Oral Daily  . ferumoxytol  510 mg Intravenous Weekly  . heparin subcutaneous  5,000 Units Subcutaneous 3 times per day  . hydrALAZINE  25 mg Oral TID  . HYDROcodone-acetaminophen      . insulin aspart  0-9 Units Subcutaneous TID WC  . lanthanum  1,000 mg Oral TID WC  . metoprolol tartrate  75 mg Oral BID  . nicotine  21 mg Transdermal Daily  . polyethylene glycol  17 g Oral BID  . sodium chloride  10-40 mL Intracatheter Q12H  . sodium chloride  10-40 mL Intracatheter Q12H  . sodium chloride  3 mL Intravenous Q12H   Continuous Infusions:   Principal Problem:   Staphylococcus aureus  bacteremia Active Problems:   Septic joint of left shoulder region   Sinus tachycardia   Septic joint of right knee joint   CKD (chronic kidney disease) stage 3, GFR 30-59 ml/min   Septic joint of right hand   SIRS (systemic inflammatory response syndrome)   Benign essential HTN   Hepatitis C   Acute renal failure   Acute renal failure syndrome   Hep C w/ coma, chronic  Joint infection    Time spent: 20 minutes.     [facility] Pager [phone] If 7PM-7AM, please contact night-coverage at [email or web address], password [facility] 09/01/2014, 6:26 PM  LOS: 25 days

## 2014-09-01 NOTE — Clinical Social Work Note (Addendum)
CSW continuing to monitor patient's progress and she is not yet medically stable for discharge. Patient is agreeable and will d/c to a skilled facility once stable. CSW will provide clinicals updates to skilled facilities as initially faxed out on 08/17/14.   Reannah Totten Givens, MSW, LCSW Licensed Clinical Social Worker [place] [phone]

## 2014-09-01 NOTE — Procedures (Signed)
I was present at this session.  I have reviewed the session itself and made appropriate changes.  Vol xs, cath function ok , flow 400, bp ^  Gibson Telleria L 3/18/20169:14 AM

## 2014-09-02 ENCOUNTER — Inpatient Hospital Stay (HOSPITAL_COMMUNITY): Payer: Medicaid Other

## 2014-09-02 LAB — GLUCOSE, CAPILLARY
Glucose-Capillary: 115 mg/dL — ABNORMAL HIGH (ref 70–99)
Glucose-Capillary: 160 mg/dL — ABNORMAL HIGH (ref 70–99)
Glucose-Capillary: 158 mg/dL — ABNORMAL HIGH (ref 70–99)

## 2014-09-02 LAB — BASIC METABOLIC PANEL
Anion gap: 10 (ref 5–15)
BUN: 10 mg/dL (ref 6–23)
CO2: 25 mmol/L (ref 19–32)
Calcium: 7.9 mg/dL — ABNORMAL LOW (ref 8.4–10.5)
Chloride: 96 mmol/L (ref 96–112)
Creatinine, Ser: 4.54 mg/dL — ABNORMAL HIGH (ref 0.50–1.10)
GFR calc Af Amer: 11 mL/min — ABNORMAL LOW (ref >90)
GFR calc non Af Amer: 10 mL/min — ABNORMAL LOW (ref >90)
Glucose, Bld: 114 mg/dL — ABNORMAL HIGH (ref 70–99)
Potassium: 3.6 mmol/L (ref 3.5–5.1)
Sodium: 131 mmol/L — ABNORMAL LOW (ref 135–145)

## 2014-09-02 LAB — URINALYSIS, ROUTINE W REFLEX MICROSCOPIC
Glucose, UA: NEGATIVE mg/dL
Ketones, ur: 15 mg/dL — AB
Nitrite: POSITIVE — AB
Protein, ur: >300 mg/dL — AB
Specific Gravity, Urine: 1.026 (ref 1.005–1.030)
Urobilinogen, UA: 0.2 mg/dL (ref 0.0–1.0)
pH: 7 (ref 5.0–8.0)

## 2014-09-02 LAB — CBC
HCT: 25.2 % — ABNORMAL LOW (ref 36.0–46.0)
Hemoglobin: 7.8 g/dL — ABNORMAL LOW (ref 12.0–15.0)
MCH: 29.2 pg (ref 26.0–34.0)
MCHC: 31 g/dL (ref 30.0–36.0)
MCV: 94.4 fL (ref 78.0–100.0)
Platelets: 360 10*3/uL (ref 150–400)
RBC: 2.67 MIL/uL — ABNORMAL LOW (ref 3.87–5.11)
RDW: 16 % — ABNORMAL HIGH (ref 11.5–15.5)
WBC: 9.9 10*3/uL (ref 4.0–10.5)

## 2014-09-02 LAB — RENAL FUNCTION PANEL
Albumin: 1.4 g/dL — ABNORMAL LOW (ref 3.5–5.2)
Anion gap: 9 (ref 5–15)
BUN: 11 mg/dL (ref 6–23)
CO2: 25 mmol/L (ref 19–32)
Calcium: 7.8 mg/dL — ABNORMAL LOW (ref 8.4–10.5)
Chloride: 96 mmol/L (ref 96–112)
Creatinine, Ser: 4.59 mg/dL — ABNORMAL HIGH (ref 0.50–1.10)
GFR calc Af Amer: 11 mL/min — ABNORMAL LOW (ref >90)
GFR calc non Af Amer: 10 mL/min — ABNORMAL LOW (ref >90)
Glucose, Bld: 115 mg/dL — ABNORMAL HIGH (ref 70–99)
Phosphorus: 3.9 mg/dL (ref 2.3–4.6)
Potassium: 3.5 mmol/L (ref 3.5–5.1)
Sodium: 130 mmol/L — ABNORMAL LOW (ref 135–145)

## 2014-09-02 LAB — URINE MICROSCOPIC-ADD ON

## 2014-09-02 MED ORDER — SODIUM CHLORIDE 0.9 % IV SOLN
INTRAVENOUS | Status: DC
  Administered 2014-09-02 – 2014-09-09 (×3): via INTRAVENOUS

## 2014-09-02 MED ORDER — OXYBUTYNIN CHLORIDE 5 MG PO TABS
5.0000 mg | ORAL_TABLET | Freq: Two times a day (BID) | ORAL | Status: DC
  Administered 2014-09-02 – 2014-09-06 (×8): 5 mg via ORAL
  Filled 2014-09-02 (×9): qty 1

## 2014-09-02 MED ORDER — HYDROCODONE-ACETAMINOPHEN 5-325 MG PO TABS
ORAL_TABLET | ORAL | Status: AC
  Filled 2014-09-02: qty 2

## 2014-09-02 NOTE — Procedures (Signed)
I was present at this session.  I have reviewed the session itself and made appropriate changes. HD via PC,  Vol xs , for 4 liters.  4K  Brandy Houston L 3/19/20169:26 AM

## 2014-09-02 NOTE — Progress Notes (Signed)
09/02/2014 3:03 PM  In/out patient per MD orders after bladder scan was performed. Urine was brown and thick. MD ordered some lads to be taken on the urine. Will continue to access and monitor the patient   [employer], RN-BC, RN3 [facility] 6 [employer] [phone]

## 2014-09-02 NOTE — Progress Notes (Signed)
Subjective: Interval History: has complaints less swollen, but legs heavy, joints hurt.  Objective: Vital signs in last 24 hours: Temp:  [98.8 F (37.1 C)-101.3 F (38.5 C)] 98.8 F (37.1 C) (03/19 0648) Pulse Rate:  [77-93] 87 (03/19 0900) Resp:  [16-19] 19 (03/19 0648) BP: (123-173)/(71-90) 156/85 mmHg (03/19 0900) SpO2:  [92 %-99 %] 94 % (03/19 0435) Weight:  [97.8 kg (215 lb 9.8 oz)-98.3 kg (216 lb 11.4 oz)] 98.2 kg (216 lb 7.9 oz) (03/19 0648) Weight change: -4.214 kg (-9 lb 4.6 oz)  Intake/Output from previous day: 03/18 0701 - 03/19 0700 In: 360 [P.O.:360] Out: 3927  Intake/Output this shift:    General appearance: alert, cooperative and moderately obese Resp: diminished breath sounds bilaterally and rales bibasilar Chest wall: RIJ cath Cardio: S1, S2 normal and systolic murmur: holosystolic 3/6, blowing at apex GI: obese,pos bs, liver down 6 cm, striae, edema of abdm wall Extremities: edema 3+  Lab Results:  Recent Labs  09/01/14 0708 09/02/14 0645  WBC 12.4* 9.9  HGB 8.1* 7.8*  HCT 25.5* 25.2*  PLT 343 360   BMET:  Recent Labs  09/02/14 0505 09/02/14 0720  NA 131* 130*  K 3.6 3.5  CL 96 96  CO2 25 25  GLUCOSE 114* 115*  BUN 10 11  CREATININE 4.54* 4.59*  CALCIUM 7.9* 7.8*   No results for input(s): PTH in the last 72 hours. Iron Studies: No results for input(s): IRON, TIBC, TRANSFERRIN, FERRITIN in the last 72 hours.  Studies/Results: No results found.  I have reviewed the patient's current medications.  Assessment/Plan: 1 AKI oliguric, vol xs, 2nd d in a row. Still vol xs.  AIN/AGN 2 bp^,vol xs 3 septic joints on AB 8 wk 4 Obesity 5 Hep C 6 DM 7 smoking P HD, epo, AB, will plan HD Mon.  4 k bath    LOS: 26 days   Brandy Houston L 09/02/2014,9:21 AM

## 2014-09-02 NOTE — Progress Notes (Signed)
[facility] PROGRESS NOTE  Brandy Houston [id number] DOB: August 23, 1955 DOA: 08/07/2014 PCP: Chari Manning, NP Interim summary: 59 year old lady with multiple medical problems initially admitted for left shoulder pain, further work up revealed MSSA bacteremia with septic arthritis of the numerous joints.  She underwent arthroscopic irrigation and debridement of the septic arthritis.  TEE is negative for endocarditis.  She was started on ancef by ID and recommended 8 weeks of treatment. Meanwhile her renal function worsened and renal consulted.  She was started on HD sessions.   Assessment/Plan: 1.  MSSA BACTEREMIA with septic arthritis: Currently on cefazolin to continue a total of 8 weeks of treatment. She is afebrile but has persistent leukocytosis. procalcitonin levels 0.54.  ID and orthopedics on board.  Pain control and Physical therapy.    2. 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 Still volume overloaded, requiring HD .  Further management as per renal. Creatinine i stabel around 4.59  Diabetes mellitus; CBG (last 3)   Recent Labs  09/01/14 2048 09/02/14 1137 09/02/14 1632  GLUCAP 154* 115* 160*    Good control of blood sugars.  Resume SSI. No changes in diabetes management.   3. Hepatitis C antibody positive: further management with ID as outpatient.    Anemia; Anemia probably from chronic disease.  epo as per renal.  Repeat hemoglobin is 7.8, transfuse to keep hemoglobin greater than 7.  Severe hypoalbuminemia: Nutrition supplementation.    Tobacco abuse: Counseling given.     Hypertension:  well controlled. Resume the same dose of anti hypertensives.    Mild hyponatremia; HD should correct it.   Urinary retention: Persistent, and ordered oxybutynin.  UA and urine culture ordered.  Urology will be consulted.    Fever: spetic work up ordered.  CXR shows moderate  effusions.  UA shows infection.  Urine cultures sent.     Code Status: full code.  Family Communication: none at bedside Disposition Plan: pending, plan for SNF when her renal function improves.    Consultants:  Renal  ID  Orthopedics.     Procedures:  HD   Arthroscopic irrigation and debridement of septic arthritis of left knee on 3/5   Arthroscopic extensive synovectomy in all 3 compartments of the left knee ON 3/5  Renal biopsy done on march 4 th 2016 and was found to have C3  Acute diffuse proliferative glomerulonephritis.   Antibiotics:  Ancef. Day 21/56  HPI/Subjective: Reports some lower abdominal discomfort. Bladder scan and in and out cath done and sent sample. . No nausea or vomiting.   Objective: Filed Vitals:   09/02/14 1639  BP: 140/68  Pulse: 85  Temp: 98.6 F (37 C)  Resp: 18    Intake/Output Summary (Last 24 hours) at 09/02/14 1824 Last data filed at 09/02/14 1752  Gross per 24 hour  Intake    430 ml  Output   4116 ml  Net  -3686 ml   Filed Weights   09/01/14 1054 09/01/14 2047 09/02/14 0648  Weight: 97.8 kg (215 lb 9.8 oz) 98.3 kg (216 lb 11.4 oz) 98.2 kg (216 lb 7.9 oz)    Exam:   General:  Alert afebrile comfortable  Cardiovascular: s1s2, RRR no mrg.   Respiratory: diminished air entry at bases, no wheezing or rhonchi  Abdomen: soft non tender non distended bowel sounds heard  Musculoskeletal: leg edema 2+  Data Reviewed: Basic Metabolic Panel:  Recent Labs Lab 08/28/14 0700 08/29/14  [id number] 08/30/14 0515 08/31/14 1141 09/01/14 0510 09/01/14 0708 09/02/14 0505 09/02/14 0720  NA 130* 135 132* 132* 133* 133* 131* 130*  K 4.0 4.1 4.4 3.7 3.8 4.0 3.6 3.5  CL 96 99 99 96 97 97 96 96  CO2 28 25 25 27 28 27 25 25   GLUCOSE 114* 107* 99 160* 121* 114* 114* 115*  BUN 51* 28* 33* 14 18 19 10 11   CREATININE 7.62* 5.26* 6.62* 4.72* 5.94* 5.98* 4.54* 4.59*  CALCIUM 7.8* 7.5* 7.9* 7.9* 8.1* 8.2* 7.9* 7.8*  PHOS 7.1* 4.7*  5.4*  --   --  4.4  --  3.9   Liver Function Tests:  Recent Labs Lab 08/28/14 1953 08/29/14 0415 08/30/14 0515 09/01/14 0708 09/02/14 0720  AST 12  --   --   --   --   ALT <5  --   --   --   --   ALKPHOS 100  --   --   --   --   BILITOT 0.7  --   --   --   --   PROT 6.7  --   --   --   --   ALBUMIN 1.2* 1.2* 1.2* 1.3* 1.4*   No results for input(s): LIPASE, AMYLASE in the last 168 hours. No results for input(s): AMMONIA in the last 168 hours. CBC:  Recent Labs Lab 08/27/14 0545 08/28/14 0700 08/28/14 1620 08/31/14 1141 09/01/14 0708 09/02/14 0645  WBC 9.5 9.9  --  12.6* 12.4* 9.9  HGB 7.1* 7.4* 8.7* 8.0* 8.1* 7.8*  HCT 21.9* 22.6* 26.1* 25.3* 25.5* 25.2*  MCV 90.1 89.3  --  93.4 95.1 94.4  PLT 434* 476*  --  378 343 360   Cardiac Enzymes: No results for input(s): CKTOTAL, CKMB, CKMBINDEX, TROPONINI in the last 168 hours. BNP (last 3 results) No results for input(s): BNP in the last 8760 hours.  ProBNP (last 3 results) No results for input(s): PROBNP in the last 8760 hours.  CBG:  Recent Labs Lab 09/01/14 1120 09/01/14 1650 09/01/14 2048 09/02/14 1137 09/02/14 1632  GLUCAP 182* 132* 154* 115* 160*    No results found for this or any previous visit (from the past 240 hour(s)).   Studies: Dg Chest 2 View  09/02/2014   CLINICAL DATA:  Fever, dialysis patient  EXAM: CHEST  2 VIEW  COMPARISON:  Chest radiograph 08/22/2014  FINDINGS: Right-sided dialysis catheter is in good position. Left central venous line with tip in distal SVC. Normal cardiac silhouette. The bilateral pleural effusions which are moderate. Effusions appear increased compared to prior.  IMPRESSION: Increasing bilateral moderate effusions.   Electronically Signed   By: Suzy Bouchard M.D.   On: 09/02/2014 12:47    Scheduled Meds: . antiseptic oral rinse  7 mL Mouth Rinse q12n4p  . aspirin  81 mg Oral Daily  .  ceFAZolin (ANCEF) IV  1 g Intravenous Q24H  . chlorhexidine  15 mL Mouth Rinse  BID  . cyclobenzaprine  5 mg Oral BID  . [START ON 09/04/2014] darbepoetin (ARANESP) injection - DIALYSIS  100 mcg Intravenous Q Mon-HD  . famotidine  20 mg Oral Daily  . ferumoxytol  510 mg Intravenous Weekly  . heparin subcutaneous  5,000 Units Subcutaneous 3 times per day  . hydrALAZINE  25 mg Oral TID  . HYDROcodone-acetaminophen      . insulin aspart  0-9 Units Subcutaneous TID WC  . lanthanum  1,000 mg Oral TID WC  .  metoprolol tartrate  75 mg Oral BID  . nicotine  21 mg Transdermal Daily  . oxybutynin  5 mg Oral BID  . polyethylene glycol  17 g Oral BID  . sodium chloride  10-40 mL Intracatheter Q12H  . sodium chloride  10-40 mL Intracatheter Q12H  . sodium chloride  3 mL Intravenous Q12H   Continuous Infusions: . sodium chloride 10 mL/hr at 09/02/14 1122    Principal Problem:   Staphylococcus aureus bacteremia Active Problems:   Septic joint of left shoulder region   Sinus tachycardia   Septic joint of right knee joint   CKD (chronic kidney disease) stage 3, GFR 30-59 ml/min   Septic joint of right hand   SIRS (systemic inflammatory response syndrome)   Benign essential HTN   Hepatitis C   Acute renal failure   Acute renal failure syndrome   Hep C w/ coma, chronic   Joint infection    Time spent: 20 minutes.     [facility] Pager [phone] If 7PM-7AM, please contact night-coverage at [email or web address], password [facility] 09/02/2014, 6:24 PM  LOS: 26 days

## 2014-09-03 LAB — BASIC METABOLIC PANEL
Anion gap: 5 (ref 5–15)
BUN: 10 mg/dL (ref 6–23)
CO2: 30 mmol/L (ref 19–32)
Calcium: 7.7 mg/dL — ABNORMAL LOW (ref 8.4–10.5)
Chloride: 96 mmol/L (ref 96–112)
Creatinine, Ser: 3.78 mg/dL — ABNORMAL HIGH (ref 0.50–1.10)
GFR calc Af Amer: 14 mL/min — ABNORMAL LOW (ref >90)
GFR calc non Af Amer: 12 mL/min — ABNORMAL LOW (ref >90)
Glucose, Bld: 144 mg/dL — ABNORMAL HIGH (ref 70–99)
Potassium: 3.9 mmol/L (ref 3.5–5.1)
Sodium: 131 mmol/L — ABNORMAL LOW (ref 135–145)

## 2014-09-03 LAB — GLUCOSE, CAPILLARY
Glucose-Capillary: 129 mg/dL — ABNORMAL HIGH (ref 70–99)
Glucose-Capillary: 151 mg/dL — ABNORMAL HIGH (ref 70–99)
Glucose-Capillary: 130 mg/dL — ABNORMAL HIGH (ref 70–99)

## 2014-09-03 LAB — PROCALCITONIN: Procalcitonin: 0.56 ng/mL

## 2014-09-03 NOTE — Progress Notes (Signed)
[facility] PROGRESS NOTE  Brandy Houston [id number] DOB: [birth year] DOA: 08/07/2014 PCP: Chari Manning, NP Interim summary: 59 year old lady with multiple medical problems initially admitted for left shoulder pain, further work up revealed MSSA bacteremia with septic arthritis of the numerous joints.  She underwent arthroscopic irrigation and debridement of the septic arthritis.  TEE is negative for endocarditis.  She was started on ancef by ID and recommended 8 weeks of treatment. Meanwhile her renal function worsened and renal consulted.  She was started on HD sessions.   Assessment/Plan: 1.  MSSA BACTEREMIA with septic arthritis: Currently on cefazolin to continue a total of 8 weeks of treatment.  Had fever in the last 24 hours, got UA, which probably had infection, urine cultures sent and pending.  Pain control.  ID and orthopedics are available as needed.    2. 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 Still volume overloaded, requiring HD .  Further management as per renal.  Creatinine improved to 3.78.  Diabetes mellitus; CBG (last 3)   Recent Labs  09/02/14 2119 09/03/14 0735 09/03/14 1141  GLUCAP 158* 129* 151*    Good control of blood sugars.  Resume SSI. No changes in diabetes management.    Hepatitis C antibody positive: further management with ID as outpatient.    Anemia; Anemia probably from chronic disease.  epo as per renal. She received a dose of ferra heme today to improve her iron levels.  Her last hemoglobin was 7.8, did not repeat her H&H today, plan to repeat in am.  Transfuse to keep hemoglobin greater than 7.   Severe hypoalbuminemia: Nutrition supplementation.    Tobacco abuse: Counseling given.     Hypertension:  well controlled. Resume the same dose of anti hypertensives.    Mild hyponatremia; HD should correct it.   Urinary retention: Persistent,  and ordered oxybutynin. She reports she urinated a little yesterday. Will watch her on e more day on the medication, if no improvement plan to call urology.  UA appears to be infection and urine culture ordered. She is already on ancef.     Fever: Resolved.     Code Status: full code.  Family Communication: none at bedside Disposition Plan: pending, plan for SNF when her renal function improves.    Consultants:  Renal  ID  Orthopedics.     Procedures:  HD   Arthroscopic irrigation and debridement of septic arthritis of left knee on 3/5   Arthroscopic extensive synovectomy in all 3 compartments of the left knee ON 3/5  Renal biopsy done on march 4 th 2016 and was found to have C3  Acute diffuse proliferative glomerulonephritis.   Antibiotics:  Ancef. Day 21/56  HPI/Subjective: She is more cheerful today. Pain control with pain meds and flexiril.  No new complaints. No abdominal pain.   Objective: Filed Vitals:   09/03/14 1032  BP: 143/71  Pulse: 93  Temp: 98.8 F (37.1 C)  Resp: 18    Intake/Output Summary (Last 24 hours) at 09/03/14 1543 Last data filed at 09/03/14 1033  Gross per 24 hour  Intake    730 ml  Output      1 ml  Net    729 ml   Filed Weights   09/01/14 1054 09/01/14 2047 09/02/14 0648  Weight: 97.8 kg (215 lb 9.8 oz) 98.3 kg (216 lb 11.4 oz) 98.2 kg (216 lb 7.9 oz)    Exam:  General:  Alert afebrile comfortable  Cardiovascular: s1s2, RRR   Respiratory: clear to auscultation, no wheezing or rhonchi  Abdomen: soft non tender non distended bowel sounds heard  Musculoskeletal: leg edema 1+  Data Reviewed: Basic Metabolic Panel:  Recent Labs Lab 08/28/14 0700 08/29/14 0415 08/30/14 0515  09/01/14 0510 09/01/14 0708 09/02/14 0505 09/02/14 0720 09/03/14 0455  NA 130* 135 132*  < > 133* 133* 131* 130* 131*  K 4.0 4.1 4.4  < > 3.8 4.0 3.6 3.5 3.9  CL 96 99 99  < > 97 97 96 96 96  CO2 28 25 25   < > 28 27 25 25 30    GLUCOSE 114* 107* 99  < > 121* 114* 114* 115* 144*  BUN 51* 28* 33*  < > 18 19 10 11 10   CREATININE 7.62* 5.26* 6.62*  < > 5.94* 5.98* 4.54* 4.59* 3.78*  CALCIUM 7.8* 7.5* 7.9*  < > 8.1* 8.2* 7.9* 7.8* 7.7*  PHOS 7.1* 4.7* 5.4*  --   --  4.4  --  3.9  --   < > = values in this interval not displayed. Liver Function Tests:  Recent Labs Lab 08/28/14 1953 08/29/14 0415 08/30/14 0515 09/01/14 0708 09/02/14 0720  AST 12  --   --   --   --   ALT <5  --   --   --   --   ALKPHOS 100  --   --   --   --   BILITOT 0.7  --   --   --   --   PROT 6.7  --   --   --   --   ALBUMIN 1.2* 1.2* 1.2* 1.3* 1.4*   No results for input(s): LIPASE, AMYLASE in the last 168 hours. No results for input(s): AMMONIA in the last 168 hours. CBC:  Recent Labs Lab 08/28/14 0700 08/28/14 1620 08/31/14 1141 09/01/14 0708 09/02/14 0645  WBC 9.9  --  12.6* 12.4* 9.9  HGB 7.4* 8.7* 8.0* 8.1* 7.8*  HCT 22.6* 26.1* 25.3* 25.5* 25.2*  MCV 89.3  --  93.4 95.1 94.4  PLT 476*  --  378 343 360   Cardiac Enzymes: No results for input(s): CKTOTAL, CKMB, CKMBINDEX, TROPONINI in the last 168 hours. BNP (last 3 results) No results for input(s): BNP in the last 8760 hours.  ProBNP (last 3 results) No results for input(s): PROBNP in the last 8760 hours.  CBG:  Recent Labs Lab 09/02/14 1137 09/02/14 1632 09/02/14 2119 09/03/14 0735 09/03/14 1141  GLUCAP 115* 160* 158* 129* 151*    No results found for this or any previous visit (from the past 240 hour(s)).   Studies: Dg Chest 2 View  09/02/2014   CLINICAL DATA:  Fever, dialysis patient  EXAM: CHEST  2 VIEW  COMPARISON:  Chest radiograph 08/22/2014  FINDINGS: Right-sided dialysis catheter is in good position. Left central venous line with tip in distal SVC. Normal cardiac silhouette. The bilateral pleural effusions which are moderate. Effusions appear increased compared to prior.  IMPRESSION: Increasing bilateral moderate effusions.   Electronically Signed    By: Suzy Bouchard M.D.   On: 09/02/2014 12:47    Scheduled Meds: . antiseptic oral rinse  7 mL Mouth Rinse q12n4p  . aspirin  81 mg Oral Daily  .  ceFAZolin (ANCEF) IV  1 g Intravenous Q24H  . chlorhexidine  15 mL Mouth Rinse BID  . cyclobenzaprine  5 mg Oral BID  . [START ON  09/04/2014] darbepoetin (ARANESP) injection - DIALYSIS  100 mcg Intravenous Q Mon-HD  . famotidine  20 mg Oral Daily  . heparin subcutaneous  5,000 Units Subcutaneous 3 times per day  . hydrALAZINE  25 mg Oral TID  . insulin aspart  0-9 Units Subcutaneous TID WC  . lanthanum  1,000 mg Oral TID WC  . metoprolol tartrate  75 mg Oral BID  . nicotine  21 mg Transdermal Daily  . oxybutynin  5 mg Oral BID  . polyethylene glycol  17 g Oral BID  . sodium chloride  10-40 mL Intracatheter Q12H  . sodium chloride  10-40 mL Intracatheter Q12H  . sodium chloride  3 mL Intravenous Q12H   Continuous Infusions: . sodium chloride 10 mL/hr at 09/02/14 1122    Principal Problem:   Staphylococcus aureus bacteremia Active Problems:   Septic joint of left shoulder region   Sinus tachycardia   Septic joint of right knee joint   CKD (chronic kidney disease) stage 3, GFR 30-59 ml/min   Septic joint of right hand   SIRS (systemic inflammatory response syndrome)   Benign essential HTN   Hepatitis C   Acute renal failure   Acute renal failure syndrome   Hep C w/ coma, chronic   Joint infection    Time spent: 20 minutes.     [facility] Pager [phone] If 7PM-7AM, please contact night-coverage at [email or web address], password [facility] 09/03/2014, 3:43 PM  LOS: 27 days

## 2014-09-03 NOTE — Progress Notes (Signed)
Subjective: Interval History: has no complaint, less swelling.  Objective: Vital signs in last 24 hours: Temp:  [98 F (36.7 C)-99.9 F (37.7 C)] 99.9 F (37.7 C) (03/20 0535) Pulse Rate:  [85-92] 92 (03/20 0535) Resp:  [17-18] 17 (03/20 0535) BP: (140-165)/(68-87) 148/76 mmHg (03/20 0535) SpO2:  [96 %-100 %] 96 % (03/20 0535) Weight change:   Intake/Output from previous day: 03/19 0701 - 03/20 0700 In: 1086.3 [P.O.:930; I.V.:106.3; IV Piggyback:50] Out: 4116 [Urine:115; Stool:1] Intake/Output this shift:    General appearance: alert, cooperative, no distress and moderately obese Resp: diminished breath sounds bibasilar and rales bibasilar Chest wall: RIJ cath Cardio: S1, S2 normal and systolic murmur: holosystolic 2/6, blowing at apex GI: obese, pos bs, liver down 5 cm Extremities: edema 2-3+  Lab Results:  Recent Labs  09/01/14 0708 09/02/14 0645  WBC 12.4* 9.9  HGB 8.1* 7.8*  HCT 25.5* 25.2*  PLT 343 360   BMET:  Recent Labs  09/02/14 0720 09/03/14 0455  NA 130* 131*  K 3.5 3.9  CL 96 96  CO2 25 30  GLUCOSE 115* 144*  BUN 11 10  CREATININE 4.59* 3.78*  CALCIUM 7.8* 7.7*   No results for input(s): PTH in the last 72 hours. Iron Studies: No results for input(s): IRON, TIBC, TRANSFERRIN, FERRITIN in the last 72 hours.  Studies/Results: Dg Chest 2 View  09/02/2014   CLINICAL DATA:  Fever, dialysis patient  EXAM: CHEST  2 VIEW  COMPARISON:  Chest radiograph 08/22/2014  FINDINGS: Right-sided dialysis catheter is in good position. Left central venous line with tip in distal SVC. Normal cardiac silhouette. The bilateral pleural effusions which are moderate. Effusions appear increased compared to prior.  IMPRESSION: Increasing bilateral moderate effusions.   Electronically Signed   By: Suzy Bouchard M.D.   On: 09/02/2014 12:47    I have reviewed the patient's current medications.  Assessment/Plan: 1 AKI still oliguric AIN and AGN.  Vol xs will do HD in am  and lower vol.  2 Anemia stable 3 MSSA sepsis Ancef 4 Obesity 5 Septic joints 6 Hep C 7 DM controlled P HD, epo, ab, mobilize    LOS: 27 days   Hayward Rylander L 09/03/2014,9:09 AM

## 2014-09-04 LAB — BASIC METABOLIC PANEL
Anion gap: 7 (ref 5–15)
BUN: 15 mg/dL (ref 6–23)
CO2: 29 mmol/L (ref 19–32)
Calcium: 8.2 mg/dL — ABNORMAL LOW (ref 8.4–10.5)
Chloride: 95 mmol/L — ABNORMAL LOW (ref 96–112)
Creatinine, Ser: 5.65 mg/dL — ABNORMAL HIGH (ref 0.50–1.10)
GFR calc Af Amer: 9 mL/min — ABNORMAL LOW (ref >90)
GFR calc non Af Amer: 7 mL/min — ABNORMAL LOW (ref >90)
Glucose, Bld: 110 mg/dL — ABNORMAL HIGH (ref 70–99)
Potassium: 4.1 mmol/L (ref 3.5–5.1)
Sodium: 131 mmol/L — ABNORMAL LOW (ref 135–145)

## 2014-09-04 LAB — CBC
HCT: 26.3 % — ABNORMAL LOW (ref 36.0–46.0)
Hemoglobin: 8.1 g/dL — ABNORMAL LOW (ref 12.0–15.0)
MCH: 29.8 pg (ref 26.0–34.0)
MCHC: 30.8 g/dL (ref 30.0–36.0)
MCV: 96.7 fL (ref 78.0–100.0)
Platelets: 340 10*3/uL (ref 150–400)
RBC: 2.72 MIL/uL — ABNORMAL LOW (ref 3.87–5.11)
RDW: 16.3 % — ABNORMAL HIGH (ref 11.5–15.5)
WBC: 7.7 10*3/uL (ref 4.0–10.5)

## 2014-09-04 LAB — GLUCOSE, CAPILLARY
Glucose-Capillary: 109 mg/dL — ABNORMAL HIGH (ref 70–99)
Glucose-Capillary: 197 mg/dL — ABNORMAL HIGH (ref 70–99)
Glucose-Capillary: 103 mg/dL — ABNORMAL HIGH (ref 70–99)
Glucose-Capillary: 101 mg/dL — ABNORMAL HIGH (ref 70–99)

## 2014-09-04 MED ORDER — HEPARIN SODIUM (PORCINE) 1000 UNIT/ML DIALYSIS: 100.0000 [IU]/kg | INTRAMUSCULAR | Status: DC | PRN

## 2014-09-04 MED ORDER — HEPARIN SODIUM (PORCINE) 1000 UNIT/ML DIALYSIS
100.0000 [IU]/kg | INTRAMUSCULAR | Status: DC | PRN
  Filled 2014-09-04: qty 10

## 2014-09-04 MED ORDER — ALTEPLASE 2 MG IJ SOLR: 2.0000 mg | Freq: Once | INTRAMUSCULAR | Status: DC | PRN

## 2014-09-04 MED ORDER — HEPARIN SODIUM (PORCINE) 1000 UNIT/ML DIALYSIS
1000.0000 [IU] | INTRAMUSCULAR | Status: DC | PRN
  Filled 2014-09-04: qty 1

## 2014-09-04 MED ORDER — LIDOCAINE HCL (PF) 1 % IJ SOLN: 5.0000 mL | INTRAMUSCULAR | Status: DC | PRN

## 2014-09-04 MED ORDER — NEPRO/CARBSTEADY PO LIQD: 237.0000 mL | ORAL | Status: DC | PRN

## 2014-09-04 MED ORDER — LIDOCAINE-PRILOCAINE 2.5-2.5 % EX CREA: 1.0000 "application " | TOPICAL_CREAM | CUTANEOUS | Status: DC | PRN

## 2014-09-04 MED ORDER — DARBEPOETIN ALFA 100 MCG/0.5ML IJ SOSY
100.0000 ug | PREFILLED_SYRINGE | INTRAMUSCULAR | Status: DC
  Filled 2014-09-04: qty 0.5

## 2014-09-04 MED ORDER — SODIUM CHLORIDE 0.9 % IV SOLN: 100.0000 mL | INTRAVENOUS | Status: DC | PRN

## 2014-09-04 MED ORDER — HEPARIN SODIUM (PORCINE) 1000 UNIT/ML DIALYSIS: 1000.0000 [IU] | INTRAMUSCULAR | Status: DC | PRN

## 2014-09-04 MED ORDER — PENTAFLUOROPROP-TETRAFLUOROETH EX AERO
1.0000 | INHALATION_SPRAY | CUTANEOUS | Status: DC | PRN
Start: 2014-09-04 — End: 2014-09-04

## 2014-09-04 MED ORDER — NEPRO/CARBSTEADY PO LIQD
237.0000 mL | ORAL | Status: DC | PRN
Start: 2014-09-04 — End: 2014-09-04

## 2014-09-04 MED ORDER — ALTEPLASE 2 MG IJ SOLR
2.0000 mg | Freq: Once | INTRAMUSCULAR | Status: AC | PRN
  Filled 2014-09-04: qty 2

## 2014-09-04 MED ORDER — PENTAFLUOROPROP-TETRAFLUOROETH EX AERO: 1.0000 "application " | INHALATION_SPRAY | CUTANEOUS | Status: DC | PRN

## 2014-09-04 MED ORDER — LEVOFLOXACIN 250 MG PO TABS
250.0000 mg | ORAL_TABLET | Freq: Every day | ORAL | Status: DC
  Administered 2014-09-04 – 2014-09-05 (×2): 250 mg via ORAL
  Filled 2014-09-04 (×2): qty 1

## 2014-09-04 NOTE — Progress Notes (Signed)
ANTIBIOTIC CONSULT NOTE - FOLLOW UP  Pharmacy Consult for cefazolin Indication: disseminated MSSA infection  Allergies  Allergen Reactions  . Compazine [Prochlorperazine] Shortness Of Breath and Swelling    TONGUE SWELLS  . Omnipaque [Iohexol] Hives  . Shellfish-Derived Products Anaphylaxis  . Iodinated Diagnostic Agents Rash    Patient Measurements: Height: 5' 7.5" (171.5 cm) Weight: 210 lb 1.6 oz (95.3 kg) IBW/kg (Calculated) : 62.75  Vital Signs: Temp: 99.3 F (37.4 C) (03/21 0829) Temp Source: Oral (03/21 0829) BP: 160/86 mmHg (03/21 0829) Pulse Rate: 95 (03/21 0829) Intake/Output from previous day: 03/20 0701 - 03/21 0700 In: 573 [P.O.:340; I.V.:183; IV Piggyback:50] Out: 50 [Urine:50] Intake/Output from this shift: Total I/O In: 400 [P.O.:360; I.V.:40] Out: 0   Labs:  Recent Labs  09/02/14 0505 09/02/14 0645 09/02/14 0720 09/03/14 0455 09/04/14 0500  WBC  --  9.9  --   --  7.7  HGB  --  7.8*  --   --  8.1*  PLT  --  360  --   --  340  CREATININE 4.54*  --  4.59* 3.78*  --    Estimated Creatinine Clearance: 19.2 mL/min (by C-G formula based on Cr of 3.78). No results for input(s): VANCOTROUGH, VANCOPEAK, VANCORANDOM, GENTTROUGH, GENTPEAK, GENTRANDOM, TOBRATROUGH, TOBRAPEAK, TOBRARND, AMIKACINPEAK, AMIKACINTROU, AMIKACIN in the last 72 hours.   Microbiology: Recent Results (from the past 720 hour(s))  Culture, blood (routine x 2)     Status: None   Collection Time: 08/09/14 12:15 PM  Result Value Ref Range Status   Specimen Description BLOOD RIGHT HAND  Final   Special Requests   Final    BOTTLES DRAWN AEROBIC AND ANAEROBIC 7CCS BOTH BOTTLES   Culture   Final    STAPHYLOCOCCUS AUREUS Note: RIFAMPIN AND GENTAMICIN SHOULD NOT BE USED AS SINGLE DRUGS FOR TREATMENT OF STAPH INFECTIONS. Note: Gram Stain Report Called to,Read Back By and Verified With: Shirlean Kelly 700AM 08/10/14 [place] Performed at [facility]    Report Status 08/12/2014  FINAL  Final   Organism ID, Bacteria STAPHYLOCOCCUS AUREUS  Final      Susceptibility   Staphylococcus aureus - MIC*    CLINDAMYCIN <=0.25 SENSITIVE Sensitive     ERYTHROMYCIN <=0.25 SENSITIVE Sensitive     GENTAMICIN <=0.5 SENSITIVE Sensitive     LEVOFLOXACIN 0.25 SENSITIVE Sensitive     OXACILLIN <=0.25 SENSITIVE Sensitive     PENICILLIN >=0.5 RESISTANT Resistant     RIFAMPIN <=0.5 SENSITIVE Sensitive     TRIMETH/SULFA <=10 SENSITIVE Sensitive     VANCOMYCIN 1 SENSITIVE Sensitive     TETRACYCLINE <=1 SENSITIVE Sensitive     MOXIFLOXACIN <=0.25 SENSITIVE Sensitive     * STAPHYLOCOCCUS AUREUS  Culture, blood (routine x 2)     Status: None   Collection Time: 08/09/14 12:25 PM  Result Value Ref Range Status   Specimen Description BLOOD RIGHT ARM  Final   Special Requests   Final    BOTTLES DRAWN AEROBIC AND ANAEROBIC 10CC BOTH BOTTLES   Culture   Final    STAPHYLOCOCCUS AUREUS Note: SUSCEPTIBILITIES PERFORMED ON PREVIOUS CULTURE WITHIN THE LAST 5 DAYS. Note: Gram Stain Report Called to,Read Back By and Verified With: Shirlean Kelly 700AM 08/10/14 [place] Performed at [facility]    Report Status 08/12/2014 FINAL  Final  Culture, routine-abscess     Status: None   Collection Time: 08/10/14 11:54 AM  Result Value Ref Range Status   Specimen Description SHOULDER LEFT  Final  Special Requests NONE  Final   Gram Stain   Final    ABUNDANT WBC PRESENT, PREDOMINANTLY PMN NO SQUAMOUS EPITHELIAL CELLS SEEN MODERATE GRAM POSITIVE COCCI IN PAIRS IN CLUSTERS Performed at [facility]    Culture   Final    MODERATE STAPHYLOCOCCUS AUREUS Note: RIFAMPIN AND GENTAMICIN SHOULD NOT BE USED AS SINGLE DRUGS FOR TREATMENT OF STAPH INFECTIONS. Performed at [facility]    Report Status 08/13/2014 FINAL  Final   Organism ID, Bacteria STAPHYLOCOCCUS AUREUS  Final      Susceptibility   Staphylococcus aureus - MIC*    CLINDAMYCIN <=0.25 SENSITIVE Sensitive      ERYTHROMYCIN <=0.25 SENSITIVE Sensitive     GENTAMICIN <=0.5 SENSITIVE Sensitive     LEVOFLOXACIN 0.25 SENSITIVE Sensitive     OXACILLIN 0.5 SENSITIVE Sensitive     PENICILLIN >=0.5 RESISTANT Resistant     RIFAMPIN <=0.5 SENSITIVE Sensitive     TRIMETH/SULFA <=10 SENSITIVE Sensitive     VANCOMYCIN 1 SENSITIVE Sensitive     TETRACYCLINE <=1 SENSITIVE Sensitive     MOXIFLOXACIN <=0.25 SENSITIVE Sensitive     * MODERATE STAPHYLOCOCCUS AUREUS  Anaerobic culture     Status: None   Collection Time: 08/10/14 11:54 AM  Result Value Ref Range Status   Specimen Description SHOULDER LEFT  Final   Special Requests NONE  Final   Gram Stain   Final    MODERATE WBC PRESENT, PREDOMINANTLY PMN NO SQUAMOUS EPITHELIAL CELLS SEEN MODERATE GRAM POSITIVE COCCI IN PAIRS IN CLUSTERS Performed at [facility]    Culture   Final    NO ANAEROBES ISOLATED Note: NO PROPIONIBACTERIUM ISOLATED Performed at [facility]    Report Status 08/25/2014 FINAL  Final  Body fluid culture     Status: None   Collection Time: 08/10/14  5:22 PM  Result Value Ref Range Status   Specimen Description SYNOVIAL JOINT RIGHT KNEE  Final   Special Requests Normal  Final   Gram Stain   Final    ABUNDANT WBC PRESENT,BOTH PMN AND MONONUCLEAR NO ORGANISMS SEEN Performed at [facility]    Culture   Final    FEW STAPHYLOCOCCUS AUREUS Note: RIFAMPIN AND GENTAMICIN SHOULD NOT BE USED AS SINGLE DRUGS FOR TREATMENT OF STAPH INFECTIONS. CRITICAL RESULT CALLED TO, READ BACK BY AND VERIFIED WITH: CINDY FLORES RN 08/13/14 AT 74 AM BY [facility] Performed at [facility]    Report Status 08/14/2014 FINAL  Final   Organism ID, Bacteria STAPHYLOCOCCUS AUREUS  Final      Susceptibility   Staphylococcus aureus - MIC*    CLINDAMYCIN <=0.25 SENSITIVE Sensitive     ERYTHROMYCIN <=0.25 SENSITIVE Sensitive     GENTAMICIN <=0.5 SENSITIVE Sensitive     LEVOFLOXACIN 0.25 SENSITIVE Sensitive     OXACILLIN 0.5  SENSITIVE Sensitive     PENICILLIN >=0.5 RESISTANT Resistant     RIFAMPIN <=0.5 SENSITIVE Sensitive     TRIMETH/SULFA <=10 SENSITIVE Sensitive     VANCOMYCIN <=0.5 SENSITIVE Sensitive     TETRACYCLINE <=1 SENSITIVE Sensitive     MOXIFLOXACIN <=0.25 SENSITIVE Sensitive     * FEW STAPHYLOCOCCUS AUREUS  Surgical pcr screen     Status: Abnormal   Collection Time: 08/10/14  6:42 PM  Result Value Ref Range Status   MRSA, PCR NEGATIVE NEGATIVE Final   Staphylococcus aureus POSITIVE (A) NEGATIVE Final    Comment:        The Xpert SA Assay (FDA approved for  NASAL specimens in patients over 22 years of age), is one component of a comprehensive surveillance program.  Test performance has been validated by [facility] for patients greater than or equal to 34 year old. It is not intended to diagnose infection nor to guide or monitor treatment.   Body fluid culture     Status: None   Collection Time: 08/10/14  9:14 PM  Result Value Ref Range Status   Specimen Description FLUID LEFT AC JOINT  Final   Special Requests VANCO, ROCEPHIN  Final   Gram Stain   Final    FEW WBC PRESENT, PREDOMINANTLY PMN MODERATE GRAM POSITIVE COCCI IN PAIRS IN CLUSTERS Gram Stain Report Called to,Read Back By and Verified With: Gram Stain Report Called to,Read Back By and Verified With: Laurance Flatten RN 08/11/13 9:10AM BY [place] Performed at [facility]    Culture   Final    ABUNDANT STAPHYLOCOCCUS AUREUS Note: RIFAMPIN AND GENTAMICIN SHOULD NOT BE USED AS SINGLE DRUGS FOR TREATMENT OF STAPH INFECTIONS. Performed at [facility]    Report Status 08/13/2014 FINAL  Final   Organism ID, Bacteria STAPHYLOCOCCUS AUREUS  Final      Susceptibility   Staphylococcus aureus - MIC*    CLINDAMYCIN <=0.25 SENSITIVE Sensitive     ERYTHROMYCIN <=0.25 SENSITIVE Sensitive     GENTAMICIN <=0.5 SENSITIVE Sensitive     LEVOFLOXACIN 0.25 SENSITIVE Sensitive     OXACILLIN <=0.25 SENSITIVE Sensitive      PENICILLIN >=0.5 RESISTANT Resistant     RIFAMPIN <=0.5 SENSITIVE Sensitive     TRIMETH/SULFA <=10 SENSITIVE Sensitive     VANCOMYCIN 1 SENSITIVE Sensitive     TETRACYCLINE <=1 SENSITIVE Sensitive     MOXIFLOXACIN <=0.25 SENSITIVE Sensitive     * ABUNDANT STAPHYLOCOCCUS AUREUS  Anaerobic culture     Status: None   Collection Time: 08/10/14  9:14 PM  Result Value Ref Range Status   Specimen Description FLUID LEFT AC JOINT  Final   Special Requests VANCO, ROCEPHIN  Final   Gram Stain   Final    FEW WBC PRESENT, PREDOMINANTLY PMN MODERATE GRAM POSITIVE COCCI IN PAIRS IN CLUSTERS Performed at [facility]    Culture   Final    NO ANAEROBES ISOLATED Performed at [facility]    Report Status 08/15/2014 FINAL  Final  Culture, blood (routine x 2)     Status: None   Collection Time: 08/11/14  7:00 AM  Result Value Ref Range Status   Specimen Description BLOOD RIGHT ARM  Final   Special Requests BOTTLES DRAWN AEROBIC AND ANAEROBIC 6CC  Final   Culture   Final    NO GROWTH 5 DAYS Performed at [facility]    Report Status 08/17/2014 FINAL  Final  Culture, blood (routine x 2)     Status: None   Collection Time: 08/11/14  7:08 AM  Result Value Ref Range Status   Specimen Description BLOOD LEFT HAND  Final   Special Requests BOTTLES DRAWN AEROBIC AND ANAEROBIC [place]  Final   Culture   Final    NO GROWTH 5 DAYS Performed at [facility]    Report Status 08/17/2014 FINAL  Final  Culture, Urine     Status: None   Collection Time: 08/12/14  5:47 AM  Result Value Ref Range Status   Specimen Description URINE, RANDOM  Final   Special Requests NONE  Final   Colony Count   Final    3,000  COLONIES/ML Performed at [employer] [facility]   Final    INSIGNIFICANT GROWTH Performed at [facility]    Report Status 08/13/2014 FINAL  Final  Culture, blood (routine x 2)     Status: None   Collection Time: 08/15/14  1:15 PM  Result Value Ref  Range Status   Specimen Description BLOOD RIGHT HAND  Final   Special Requests BOTTLES DRAWN AEROBIC ONLY 3CC  Final   Culture   Final    NO GROWTH 5 DAYS Performed at [facility]    Report Status 08/21/2014 FINAL  Final  Culture, blood (routine x 2)     Status: None   Collection Time: 08/15/14  3:11 PM  Result Value Ref Range Status   Specimen Description BLOOD RIGHT HAND  Final   Special Requests   Final    BOTTLES DRAWN AEROBIC AND ANAEROBIC BLUE 10CC RED 5CC   Culture   Final    NO GROWTH 5 DAYS Performed at [facility]    Report Status 08/21/2014 FINAL  Final  Culture, Urine     Status: None   Collection Time: 08/17/14  3:14 PM  Result Value Ref Range Status   Specimen Description URINE, CATHETERIZED  Final   Special Requests NONE  Final   Colony Count NO GROWTH Performed at [facility]   Final   Culture NO GROWTH Performed at [facility]   Final   Report Status 08/18/2014 FINAL  Final  Body fluid culture     Status: None   Collection Time: 08/18/14  4:52 PM  Result Value Ref Range Status   Specimen Description FLUID SYNOVIAL LEFT KNEE  Final   Special Requests Normal  Final   Gram Stain   Final    MODERATE WBC PRESENT,BOTH PMN AND MONONUCLEAR NO ORGANISMS SEEN Performed at [facility]    Culture   Final    NO GROWTH 3 DAYS Performed at [facility]    Report Status 08/22/2014 FINAL  Final  Culture, Urine     Status: None (Preliminary result)   Collection Time: 09/02/14  3:04 PM  Result Value Ref Range Status   Specimen Description URINE, CATHETERIZED  Final   Special Requests NONE  Final   Colony Count   Final    >=100,000 COLONIES/ML Performed at [facility]    Culture   Final    [place] Performed at [facility]    Report Status PENDING  Incomplete    Anti-infectives    Start     Dose/Rate Route Frequency Ordered Stop   09/04/14 1100  levofloxacin (LEVAQUIN) tablet  250 mg    Comments:  For UTI   250 mg Oral Daily 09/04/14 1019     09/02/14 1800  ceFAZolin (ANCEF) IVPB 1 g/50 mL premix     1 g 100 mL/hr over 30 Minutes Intravenous Every 24 hours 09/01/14 1457     08/29/14 0900  ceFAZolin (ANCEF) IVPB 2 g/50 mL premix     2 g 100 mL/hr over 30 Minutes Intravenous  Once 08/29/14 0854 08/29/14 0925   08/29/14 0802  ceFAZolin (ANCEF) 2-3 GM-% IVPB SOLR  Status:  Discontinued    Comments:  Laughlin, Amy   : cabinet override      08/29/14 0802 08/29/14 0815   08/28/14 2200  ceFAZolin (ANCEF) IVPB 2 g/50 mL premix  Status:  Discontinued     2 g  100 mL/hr over 30 Minutes Intravenous Every M-W-F (Hemodialysis) 08/28/14 1501 09/01/14 1457   08/28/14 1200  ceFAZolin (ANCEF) IVPB 2 g/50 mL premix  Status:  Discontinued    Comments:  On call to xray 3/14   2 g 100 mL/hr over 30 Minutes Intravenous On call 08/28/14 0924 08/28/14 1446   08/23/14 2200  fluconazole (DIFLUCAN) tablet 150 mg     150 mg Oral  Once 08/23/14 2151 08/23/14 2252   08/19/14 0500  ceFAZolin (ANCEF) IVPB 2 g/50 mL premix  Status:  Discontinued     2 g 100 mL/hr over 30 Minutes Intravenous Every 12 hours 08/18/14 1524 08/28/14 1501   08/18/14 1800  ceFAZolin (ANCEF) IVPB 2 g/50 mL premix  Status:  Discontinued     2 g 100 mL/hr over 30 Minutes Intravenous Every 12 hours 08/18/14 1517 08/18/14 1524   08/18/14 1330  ceFAZolin (ANCEF) 2-3 GM-% IVPB SOLR    Comments:  Barley, Jenny   : cabinet override      08/18/14 1330 08/19/14 0144   08/18/14 0600  ceFAZolin (ANCEF) IVPB 1 g/50 mL premix  Status:  Discontinued     1 g 100 mL/hr over 30 Minutes Intravenous Every 12 hours 08/17/14 1822 08/18/14 1517   08/16/14 1800  ceFAZolin (ANCEF) IVPB 2 g/50 mL premix  Status:  Discontinued     2 g 100 mL/hr over 30 Minutes Intravenous Every 12 hours 08/16/14 1355 08/17/14 1822   08/15/14 1200  nafcillin 2 g in dextrose 5 % 50 mL IVPB  Status:  Discontinued     2 g 100 mL/hr over 30 Minutes  Intravenous 6 times per day 08/15/14 1028 08/16/14 1253   08/14/14 1400  ceFAZolin (ANCEF) IVPB 2 g/50 mL premix  Status:  Discontinued     2 g 100 mL/hr over 30 Minutes Intravenous 3 times per day 08/14/14 0850 08/15/14 1028   08/12/14 1800  vancomycin (VANCOCIN) 500 mg in sodium chloride 0.9 % 100 mL IVPB  Status:  Discontinued     500 mg 100 mL/hr over 60 Minutes Intravenous Every 12 hours 08/12/14 0705 08/12/14 0721   08/12/14 1800  vancomycin (VANCOCIN) IVPB 750 mg/150 ml premix  Status:  Discontinued     750 mg 150 mL/hr over 60 Minutes Intravenous Every 12 hours 08/12/14 0721 08/12/14 1315   08/10/14 2138  vancomycin (VANCOCIN) powder  Status:  Discontinued       As needed 08/10/14 2138 08/10/14 2153   08/10/14 1600  cefTRIAXone (ROCEPHIN) 2 g in dextrose 5 % 50 mL IVPB - Premix  Status:  Discontinued     2 g 100 mL/hr over 30 Minutes Intravenous Every 24 hours 08/09/14 1555 08/10/14 1432   08/10/14 1600  ceFAZolin (ANCEF) IVPB 2 g/50 mL premix  Status:  Discontinued     2 g 100 mL/hr over 30 Minutes Intravenous 3 times per day 08/10/14 1432 08/14/14 0845   08/10/14 0600  vancomycin (VANCOCIN) IVPB 750 mg/150 ml premix  Status:  Discontinued     750 mg 150 mL/hr over 60 Minutes Intravenous Every 12 hours 08/09/14 2018 08/12/14 0705   08/09/14 1800  vancomycin (VANCOCIN) 2,000 mg in sodium chloride 0.9 % 500 mL IVPB     2,000 mg 250 mL/hr over 120 Minutes Intravenous  Once 08/09/14 1616 08/09/14 1929   08/09/14 1630  cefTRIAXone (ROCEPHIN) 2 g in dextrose 5 % 50 mL IVPB - Premix     2 g 100 mL/hr over 30  Minutes Intravenous  Once 08/09/14 1553 08/09/14 1653      Assessment: 36 yoF admitted with intractable shoulder pain, 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 Patient continues on Ancef, planning for 8 weeks of therapy (end date 4/21). Patient with ARF, started on HD this admission with original plan to continue with MWF HD sessions until patient experiences renal recovery.  However, MD assessing daily for HD needs and due to the lack of consistent HD schedule, will adjust Ancef to renally adjusted dosing. BMET today is pending. Plan is for HD tomorrow. Patient remains afebrile, WBC wnl.  Goal of Therapy:  Eradication of infection  Plan:  -Ancef to 1 g IV q24h -Monitor UOP for recovery of renal function -Monitor clinical progress  Gleen Ripberger D. Saleena Tamas, PharmD, BCPS Clinical Pharmacist Pager: [phone] 09/04/2014 2:22 PM

## 2014-09-04 NOTE — Progress Notes (Addendum)
[facility] PROGRESS NOTE  Candise Perkey [id number] DOB: 26-Aug-1955 DOA: 08/07/2014 PCP: Chari Manning, NP Interim summary: 59 year old lady with multiple medical problems initially admitted for left shoulder pain, further work up revealed MSSA bacteremia with septic arthritis of the numerous joints.  She underwent arthroscopic irrigation and debridement of the septic arthritis.  TEE is negative for endocarditis.  She was started on ancef by ID and recommended 8 weeks of treatment. Meanwhile her renal function worsened and renal consulted.  She was started on HD sessions.   Assessment/Plan: 1.  MSSA BACTEREMIA with septic arthritis: Currently on cefazolin to continue a total of 8 weeks of treatment.  She had episode of fever 48 hours before, possibly from UTI, urine cultures showed gram negative rods and levaquin ordered.  Pain control.  ID and orthopedics are available as needed.    2. 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 Still volume overloaded, requiring HD .  Further management as per renal.  Creatinine is around 5 today.  Diabetes mellitus; CBG (last 3)   Recent Labs  09/04/14 0737 09/04/14 1115 09/04/14 1636  GLUCAP 109* 197* 103*    Good control of blood sugars.  Resume SSI. No changes in diabetes management.    Hepatitis C antibody positive: further management with ID as outpatient.    Anemia; Anemia probably from chronic disease.  epo as per renal. She received a dose of ferra heme  to improve her iron levels.  Her last hemoglobin is 8.1 . Transfuse to keep hemoglobin greater than 7.   Severe hypoalbuminemia: Nutrition supplementation.    Tobacco abuse: Counseling given.     Hypertension:  well controlled. Resume the same dose of anti hypertensives.    Mild hyponatremia; HD should correct it.   Urinary retention: Persistent, and ordered oxybutynin. She reports  she urinated a little yesterday. Bladder scan today showed only 80 ml of urine. UA appears to be infection and urine culture ordered. She was started on levaquin.     Fever: Resolved.  Probably from UTI.     Code Status: full code.  Family Communication: none at bedside Disposition Plan: pending, plan for SNF when her renal function improves.    Consultants:  Renal  ID  Orthopedics.     Procedures:  HD   Arthroscopic irrigation and debridement of septic arthritis of left knee on 3/5   Arthroscopic extensive synovectomy in all 3 compartments of the left knee ON 3/5  Renal biopsy done on march 4 th 2016 and was found to have C3  Acute diffuse proliferative glomerulonephritis.   Antibiotics:  Ancef and levaquin  HPI/Subjective: NO NEW issues today. . Pain control with pain meds and flexiril.  No new complaints. No abdominal pain.   Objective: Filed Vitals:   09/04/14 1743  BP: 152/77  Pulse: 84  Temp: 98.3 F (36.8 C)  Resp: 17    Intake/Output Summary (Last 24 hours) at 09/04/14 1929 Last data filed at 09/04/14 1700  Gross per 24 hour  Intake    663 ml  Output     50 ml  Net    613 ml   Filed Weights   09/01/14 2047 09/02/14 0648 09/03/14 2109  Weight: 98.3 kg (216 lb 11.4 oz) 98.2 kg (216 lb 7.9 oz) 95.3 kg (210 lb 1.6 oz)    Exam:   General:  Alert afebrile comfortable  Cardiovascular: s1s2, RRR   Respiratory: clear to  auscultation, no wheezing or rhonchi  Abdomen: soft non tender non distended bowel sounds heard  Musculoskeletal: leg edema 1+  Data Reviewed: Basic Metabolic Panel:  Recent Labs Lab 08/29/14 0415 08/30/14 0515  09/01/14 0708 09/02/14 0505 09/02/14 0720 09/03/14 0455 09/04/14 1404  NA 135 132*  < > 133* 131* 130* 131* 131*  K 4.1 4.4  < > 4.0 3.6 3.5 3.9 4.1  CL 99 99  < > 97 96 96 96 95*  CO2 25 25  < > 27 25 25 30 29   GLUCOSE 107* 99  < > 114* 114* 115* 144* 110*  BUN 28* 33*  < > 19 10 11 10 15    CREATININE 5.26* 6.62*  < > 5.98* 4.54* 4.59* 3.78* 5.65*  CALCIUM 7.5* 7.9*  < > 8.2* 7.9* 7.8* 7.7* 8.2*  PHOS 4.7* 5.4*  --  4.4  --  3.9  --   --   < > = values in this interval not displayed. Liver Function Tests:  Recent Labs Lab 08/28/14 1953 08/29/14 0415 08/30/14 0515 09/01/14 0708 09/02/14 0720  AST 12  --   --   --   --   ALT <5  --   --   --   --   ALKPHOS 100  --   --   --   --   BILITOT 0.7  --   --   --   --   PROT 6.7  --   --   --   --   ALBUMIN 1.2* 1.2* 1.2* 1.3* 1.4*   No results for input(s): LIPASE, AMYLASE in the last 168 hours. No results for input(s): AMMONIA in the last 168 hours. CBC:  Recent Labs Lab 08/31/14 1141 09/01/14 0708 09/02/14 0645 09/04/14 0500  WBC 12.6* 12.4* 9.9 7.7  HGB 8.0* 8.1* 7.8* 8.1*  HCT 25.3* 25.5* 25.2* 26.3*  MCV 93.4 95.1 94.4 96.7  PLT 378 343 360 340   Cardiac Enzymes: No results for input(s): CKTOTAL, CKMB, CKMBINDEX, TROPONINI in the last 168 hours. BNP (last 3 results) No results for input(s): BNP in the last 8760 hours.  ProBNP (last 3 results) No results for input(s): PROBNP in the last 8760 hours.  CBG:  Recent Labs Lab 09/03/14 1141 09/03/14 1714 09/04/14 0737 09/04/14 1115 09/04/14 1636  GLUCAP 151* 130* 109* 197* 103*    Recent Results (from the past 240 hour(s))  Culture, Urine     Status: None (Preliminary result)   Collection Time: 09/02/14  3:04 PM  Result Value Ref Range Status   Specimen Description URINE, CATHETERIZED  Final   Special Requests NONE  Final   Colony Count   Final    >=100,000 COLONIES/ML Performed at [place] Performed at [facility]    Report Status PENDING  Incomplete     Studies: No results found.  Scheduled Meds: . antiseptic oral rinse  7 mL Mouth Rinse q12n4p  . aspirin  81 mg Oral Daily  .  ceFAZolin (ANCEF) IV  1 g Intravenous Q24H  . chlorhexidine  15 mL Mouth Rinse BID  .  cyclobenzaprine  5 mg Oral BID  . [START ON 09/05/2014] darbepoetin (ARANESP) injection - DIALYSIS  100 mcg Intravenous Q Tue-HD  . famotidine  20 mg Oral Daily  . heparin subcutaneous  5,000 Units Subcutaneous 3 times per day  . hydrALAZINE  25 mg Oral TID  .  insulin aspart  0-9 Units Subcutaneous TID WC  . lanthanum  1,000 mg Oral TID WC  . levofloxacin  250 mg Oral Daily  . metoprolol tartrate  75 mg Oral BID  . nicotine  21 mg Transdermal Daily  . oxybutynin  5 mg Oral BID  . polyethylene glycol  17 g Oral BID  . sodium chloride  10-40 mL Intracatheter Q12H  . sodium chloride  10-40 mL Intracatheter Q12H  . sodium chloride  3 mL Intravenous Q12H   Continuous Infusions: . sodium chloride 10 mL/hr at 09/04/14 0700    Principal Problem:   Staphylococcus aureus bacteremia Active Problems:   Septic joint of left shoulder region   Sinus tachycardia   Septic joint of right knee joint   CKD (chronic kidney disease) stage 3, GFR 30-59 ml/min   Septic joint of right hand   SIRS (systemic inflammatory response syndrome)   Benign essential HTN   Hepatitis C   Acute renal failure   Acute renal failure syndrome   Hep C w/ coma, chronic   Joint infection    Time spent: 25 minutes.     [facility] Pager [phone] If 7PM-7AM, please contact night-coverage at [email or web address], password [facility] 09/04/2014, 7:29 PM  LOS: 28 days

## 2014-09-04 NOTE — Progress Notes (Signed)
[facility] ROUNDING NOTE   Subjective:   Interval History:  No complaints today  Objective:  Vital signs in last 24 hours:  Temp:  [98.4 F (36.9 C)-99.3 F (37.4 C)] 99.3 F (37.4 C) (03/21 0829) Pulse Rate:  [87-95] 95 (03/21 0829) Resp:  [18-22] 18 (03/21 0829) BP: (160-168)/(77-86) 160/86 mmHg (03/21 0829) SpO2:  [94 %] 94 % (03/21 0829) Weight:  [95.3 kg (210 lb 1.6 oz)] 95.3 kg (210 lb 1.6 oz) (03/20 2109)  Weight change:  Filed Weights   09/01/14 2047 09/02/14 0648 09/03/14 2109  Weight: 98.3 kg (216 lb 11.4 oz) 98.2 kg (216 lb 7.9 oz) 95.3 kg (210 lb 1.6 oz)    Intake/Output: I/O last 3 completed shifts: In: [id number] [P.O.:850; I.V.:183; IV Piggyback:100] Out: 50 [Urine:50]   Intake/Output this shift:  Total I/O In: 240 [P.O.:240] Out: 0   CVS- RRR RS- CTA ABD- BS present soft non-distended EXT- 2 + edema   Basic Metabolic Panel:  Recent Labs Lab 08/29/14 0415 08/30/14 0515  09/01/14 0510 09/01/14 0708 09/02/14 0505 09/02/14 0720 09/03/14 0455  NA 135 132*  < > 133* 133* 131* 130* 131*  K 4.1 4.4  < > 3.8 4.0 3.6 3.5 3.9  CL 99 99  < > 97 97 96 96 96  CO2 25 25  < > 28 27 25 25 30   GLUCOSE 107* 99  < > 121* 114* 114* 115* 144*  BUN 28* 33*  < > 18 19 10 11 10   CREATININE 5.26* 6.62*  < > 5.94* 5.98* 4.54* 4.59* 3.78*  CALCIUM 7.5* 7.9*  < > 8.1* 8.2* 7.9* 7.8* 7.7*  PHOS 4.7* 5.4*  --   --  4.4  --  3.9  --   < > = values in this interval not displayed.  Liver Function Tests:  Recent Labs Lab 08/28/14 1953 08/29/14 0415 08/30/14 0515 09/01/14 0708 09/02/14 0720  AST 12  --   --   --   --   ALT <5  --   --   --   --   ALKPHOS 100  --   --   --   --   BILITOT 0.7  --   --   --   --   PROT 6.7  --   --   --   --   ALBUMIN 1.2* 1.2* 1.2* 1.3* 1.4*   No results for input(s): LIPASE, AMYLASE in the last 168 hours. No results for input(s): AMMONIA in the last 168 hours.  CBC:  Recent Labs Lab 08/28/14 1620  08/31/14 1141 09/01/14 0708 09/02/14 0645 09/04/14 0500  WBC  --  12.6* 12.4* 9.9 7.7  HGB 8.7* 8.0* 8.1* 7.8* 8.1*  HCT 26.1* 25.3* 25.5* 25.2* 26.3*  MCV  --  93.4 95.1 94.4 96.7  PLT  --  378 343 360 340    Cardiac Enzymes: No results for input(s): CKTOTAL, CKMB, CKMBINDEX, TROPONINI in the last 168 hours.  BNP: Invalid input(s): POCBNP  CBG:  Recent Labs Lab 09/03/14 0735 09/03/14 1141 09/03/14 1714 09/04/14 0737 09/04/14 1115  GLUCAP 129* 151* 130* 109* 197*    Microbiology: Results for orders placed or performed during the hospital encounter of 08/07/14  Culture, blood (routine x 2)     Status: None   Collection Time: 08/09/14 12:15 PM  Result Value Ref Range Status   Specimen Description BLOOD RIGHT HAND  Final   Special Requests   Final    BOTTLES  DRAWN AEROBIC AND ANAEROBIC 7CCS BOTH BOTTLES   Culture   Final    STAPHYLOCOCCUS AUREUS Note: RIFAMPIN AND GENTAMICIN SHOULD NOT BE USED AS SINGLE DRUGS FOR TREATMENT OF STAPH INFECTIONS. Note: Gram Stain Report Called to,Read Back By and Verified With: Shirlean Kelly 700AM 08/10/14 [place] Performed at [facility]    Report Status 08/12/2014 FINAL  Final   Organism ID, Bacteria STAPHYLOCOCCUS AUREUS  Final      Susceptibility   Staphylococcus aureus - MIC*    CLINDAMYCIN <=0.25 SENSITIVE Sensitive     ERYTHROMYCIN <=0.25 SENSITIVE Sensitive     GENTAMICIN <=0.5 SENSITIVE Sensitive     LEVOFLOXACIN 0.25 SENSITIVE Sensitive     OXACILLIN <=0.25 SENSITIVE Sensitive     PENICILLIN >=0.5 RESISTANT Resistant     RIFAMPIN <=0.5 SENSITIVE Sensitive     TRIMETH/SULFA <=10 SENSITIVE Sensitive     VANCOMYCIN 1 SENSITIVE Sensitive     TETRACYCLINE <=1 SENSITIVE Sensitive     MOXIFLOXACIN <=0.25 SENSITIVE Sensitive     * STAPHYLOCOCCUS AUREUS  Culture, blood (routine x 2)     Status: None   Collection Time: 08/09/14 12:25 PM  Result Value Ref Range Status   Specimen Description BLOOD RIGHT ARM  Final    Special Requests   Final    BOTTLES DRAWN AEROBIC AND ANAEROBIC 10CC BOTH BOTTLES   Culture   Final    STAPHYLOCOCCUS AUREUS Note: SUSCEPTIBILITIES PERFORMED ON PREVIOUS CULTURE WITHIN THE LAST 5 DAYS. Note: Gram Stain Report Called to,Read Back By and Verified With: Shirlean Kelly 700AM 08/10/14 [place] Performed at [facility]    Report Status 08/12/2014 FINAL  Final  Culture, routine-abscess     Status: None   Collection Time: 08/10/14 11:54 AM  Result Value Ref Range Status   Specimen Description SHOULDER LEFT  Final   Special Requests NONE  Final   Gram Stain   Final    ABUNDANT WBC PRESENT, PREDOMINANTLY PMN NO SQUAMOUS EPITHELIAL CELLS SEEN MODERATE GRAM POSITIVE COCCI IN PAIRS IN CLUSTERS Performed at [facility]    Culture   Final    MODERATE STAPHYLOCOCCUS AUREUS Note: RIFAMPIN AND GENTAMICIN SHOULD NOT BE USED AS SINGLE DRUGS FOR TREATMENT OF STAPH INFECTIONS. Performed at [facility]    Report Status 08/13/2014 FINAL  Final   Organism ID, Bacteria STAPHYLOCOCCUS AUREUS  Final      Susceptibility   Staphylococcus aureus - MIC*    CLINDAMYCIN <=0.25 SENSITIVE Sensitive     ERYTHROMYCIN <=0.25 SENSITIVE Sensitive     GENTAMICIN <=0.5 SENSITIVE Sensitive     LEVOFLOXACIN 0.25 SENSITIVE Sensitive     OXACILLIN 0.5 SENSITIVE Sensitive     PENICILLIN >=0.5 RESISTANT Resistant     RIFAMPIN <=0.5 SENSITIVE Sensitive     TRIMETH/SULFA <=10 SENSITIVE Sensitive     VANCOMYCIN 1 SENSITIVE Sensitive     TETRACYCLINE <=1 SENSITIVE Sensitive     MOXIFLOXACIN <=0.25 SENSITIVE Sensitive     * MODERATE STAPHYLOCOCCUS AUREUS  Anaerobic culture     Status: None   Collection Time: 08/10/14 11:54 AM  Result Value Ref Range Status   Specimen Description SHOULDER LEFT  Final   Special Requests NONE  Final   Gram Stain   Final    MODERATE WBC PRESENT, PREDOMINANTLY PMN NO SQUAMOUS EPITHELIAL CELLS SEEN MODERATE GRAM POSITIVE COCCI IN PAIRS IN  CLUSTERS Performed at [facility]    Culture   Final    NO ANAEROBES ISOLATED Note:  NO PROPIONIBACTERIUM ISOLATED Performed at [facility]    Report Status 08/25/2014 FINAL  Final  Body fluid culture     Status: None   Collection Time: 08/10/14  5:22 PM  Result Value Ref Range Status   Specimen Description SYNOVIAL JOINT RIGHT KNEE  Final   Special Requests Normal  Final   Gram Stain   Final    ABUNDANT WBC PRESENT,BOTH PMN AND MONONUCLEAR NO ORGANISMS SEEN Performed at [facility]    Culture   Final    FEW STAPHYLOCOCCUS AUREUS Note: RIFAMPIN AND GENTAMICIN SHOULD NOT BE USED AS SINGLE DRUGS FOR TREATMENT OF STAPH INFECTIONS. CRITICAL RESULT CALLED TO, READ BACK BY AND VERIFIED WITH: CINDY FLORES RN 08/13/14 AT 57 AM BY [facility] Performed at [facility]    Report Status 08/14/2014 FINAL  Final   Organism ID, Bacteria STAPHYLOCOCCUS AUREUS  Final      Susceptibility   Staphylococcus aureus - MIC*    CLINDAMYCIN <=0.25 SENSITIVE Sensitive     ERYTHROMYCIN <=0.25 SENSITIVE Sensitive     GENTAMICIN <=0.5 SENSITIVE Sensitive     LEVOFLOXACIN 0.25 SENSITIVE Sensitive     OXACILLIN 0.5 SENSITIVE Sensitive     PENICILLIN >=0.5 RESISTANT Resistant     RIFAMPIN <=0.5 SENSITIVE Sensitive     TRIMETH/SULFA <=10 SENSITIVE Sensitive     VANCOMYCIN <=0.5 SENSITIVE Sensitive     TETRACYCLINE <=1 SENSITIVE Sensitive     MOXIFLOXACIN <=0.25 SENSITIVE Sensitive     * FEW STAPHYLOCOCCUS AUREUS  Surgical pcr screen     Status: Abnormal   Collection Time: 08/10/14  6:42 PM  Result Value Ref Range Status   MRSA, PCR NEGATIVE NEGATIVE Final   Staphylococcus aureus POSITIVE (A) NEGATIVE Final    Comment:        The Xpert SA Assay (FDA approved for NASAL specimens in patients over 32 years of age), is one component of a comprehensive surveillance program.  Test performance has been validated by [facility] for patients greater than or equal to 60 year  old. It is not intended to diagnose infection nor to guide or monitor treatment.   Body fluid culture     Status: None   Collection Time: 08/10/14  9:14 PM  Result Value Ref Range Status   Specimen Description FLUID LEFT AC JOINT  Final   Special Requests VANCO, ROCEPHIN  Final   Gram Stain   Final    FEW WBC PRESENT, PREDOMINANTLY PMN MODERATE GRAM POSITIVE COCCI IN PAIRS IN CLUSTERS Gram Stain Report Called to,Read Back By and Verified With: Gram Stain Report Called to,Read Back By and Verified With: Laurance Flatten RN 08/11/13 9:10AM BY [place] Performed at [facility]    Culture   Final    ABUNDANT STAPHYLOCOCCUS AUREUS Note: RIFAMPIN AND GENTAMICIN SHOULD NOT BE USED AS SINGLE DRUGS FOR TREATMENT OF STAPH INFECTIONS. Performed at [facility]    Report Status 08/13/2014 FINAL  Final   Organism ID, Bacteria STAPHYLOCOCCUS AUREUS  Final      Susceptibility   Staphylococcus aureus - MIC*    CLINDAMYCIN <=0.25 SENSITIVE Sensitive     ERYTHROMYCIN <=0.25 SENSITIVE Sensitive     GENTAMICIN <=0.5 SENSITIVE Sensitive     LEVOFLOXACIN 0.25 SENSITIVE Sensitive     OXACILLIN <=0.25 SENSITIVE Sensitive     PENICILLIN >=0.5 RESISTANT Resistant     RIFAMPIN <=0.5 SENSITIVE Sensitive     TRIMETH/SULFA <=10 SENSITIVE Sensitive     VANCOMYCIN 1 SENSITIVE Sensitive  TETRACYCLINE <=1 SENSITIVE Sensitive     MOXIFLOXACIN <=0.25 SENSITIVE Sensitive     * ABUNDANT STAPHYLOCOCCUS AUREUS  Anaerobic culture     Status: None   Collection Time: 08/10/14  9:14 PM  Result Value Ref Range Status   Specimen Description FLUID LEFT AC JOINT  Final   Special Requests VANCO, ROCEPHIN  Final   Gram Stain   Final    FEW WBC PRESENT, PREDOMINANTLY PMN MODERATE GRAM POSITIVE COCCI IN PAIRS IN CLUSTERS Performed at [facility]    Culture   Final    NO ANAEROBES ISOLATED Performed at [facility]    Report Status 08/15/2014 FINAL  Final  Culture, blood (routine x 2)      Status: None   Collection Time: 08/11/14  7:00 AM  Result Value Ref Range Status   Specimen Description BLOOD RIGHT ARM  Final   Special Requests BOTTLES DRAWN AEROBIC AND ANAEROBIC 6CC  Final   Culture   Final    NO GROWTH 5 DAYS Performed at [facility]    Report Status 08/17/2014 FINAL  Final  Culture, blood (routine x 2)     Status: None   Collection Time: 08/11/14  7:08 AM  Result Value Ref Range Status   Specimen Description BLOOD LEFT HAND  Final   Special Requests BOTTLES DRAWN AEROBIC AND ANAEROBIC [place]  Final   Culture   Final    NO GROWTH 5 DAYS Performed at [facility]    Report Status 08/17/2014 FINAL  Final  Culture, Urine     Status: None   Collection Time: 08/12/14  5:47 AM  Result Value Ref Range Status   Specimen Description URINE, RANDOM  Final   Special Requests NONE  Final   Colony Count   Final    3,000 COLONIES/ML Performed at [facility]    Culture   Final    INSIGNIFICANT GROWTH Performed at [facility]    Report Status 08/13/2014 FINAL  Final  Culture, blood (routine x 2)     Status: None   Collection Time: 08/15/14  1:15 PM  Result Value Ref Range Status   Specimen Description BLOOD RIGHT HAND  Final   Special Requests BOTTLES DRAWN AEROBIC ONLY 3CC  Final   Culture   Final    NO GROWTH 5 DAYS Performed at [facility]    Report Status 08/21/2014 FINAL  Final  Culture, blood (routine x 2)     Status: None   Collection Time: 08/15/14  3:11 PM  Result Value Ref Range Status   Specimen Description BLOOD RIGHT HAND  Final   Special Requests   Final    BOTTLES DRAWN AEROBIC AND ANAEROBIC BLUE 10CC RED 5CC   Culture   Final    NO GROWTH 5 DAYS Performed at [facility]    Report Status 08/21/2014 FINAL  Final  Culture, Urine     Status: None   Collection Time: 08/17/14  3:14 PM  Result Value Ref Range Status   Specimen Description URINE, CATHETERIZED  Final   Special Requests NONE   Final   Colony Count NO GROWTH Performed at [facility]   Final   Culture NO GROWTH Performed at [facility]   Final   Report Status 08/18/2014 FINAL  Final  Body fluid culture     Status: None   Collection Time: 08/18/14  4:52 PM  Result Value Ref Range Status  Specimen Description FLUID SYNOVIAL LEFT KNEE  Final   Special Requests Normal  Final   Gram Stain   Final    MODERATE WBC PRESENT,BOTH PMN AND MONONUCLEAR NO ORGANISMS SEEN Performed at [facility]    Culture   Final    NO GROWTH 3 DAYS Performed at [facility]    Report Status 08/22/2014 FINAL  Final  Culture, Urine     Status: None (Preliminary result)   Collection Time: 09/02/14  3:04 PM  Result Value Ref Range Status   Specimen Description URINE, CATHETERIZED  Final   Special Requests NONE  Final   Colony Count   Final    >=100,000 COLONIES/ML Performed at [facility]    Culture   Final    [place] Performed at [facility]    Report Status PENDING  Incomplete    Coagulation Studies: No results for input(s): LABPROT, INR in the last 72 hours.  Urinalysis:  Recent Labs  09/02/14 1504  COLORURINE RED*  LABSPEC 1.026  PHURINE 7.0  GLUCOSEU NEGATIVE  HGBUR LARGE*  BILIRUBINUR SMALL*  KETONESUR 15*  PROTEINUR >300*  UROBILINOGEN 0.2  NITRITE POSITIVE*  LEUKOCYTESUR LARGE*      Imaging: Dg Chest 2 View  09/02/2014   CLINICAL DATA:  Fever, dialysis patient  EXAM: CHEST  2 VIEW  COMPARISON:  Chest radiograph 08/22/2014  FINDINGS: Right-sided dialysis catheter is in good position. Left central venous line with tip in distal SVC. Normal cardiac silhouette. The bilateral pleural effusions which are moderate. Effusions appear increased compared to prior.  IMPRESSION: Increasing bilateral moderate effusions.   Electronically Signed   By: Suzy Bouchard M.D.   On: 09/02/2014 12:47     Medications:   . sodium chloride 10 mL/hr at  09/03/14 1900   . antiseptic oral rinse  7 mL Mouth Rinse q12n4p  . aspirin  81 mg Oral Daily  .  ceFAZolin (ANCEF) IV  1 g Intravenous Q24H  . chlorhexidine  15 mL Mouth Rinse BID  . cyclobenzaprine  5 mg Oral BID  . darbepoetin (ARANESP) injection - DIALYSIS  100 mcg Intravenous Q Mon-HD  . famotidine  20 mg Oral Daily  . heparin subcutaneous  5,000 Units Subcutaneous 3 times per day  . hydrALAZINE  25 mg Oral TID  . insulin aspart  0-9 Units Subcutaneous TID WC  . lanthanum  1,000 mg Oral TID WC  . levofloxacin  250 mg Oral Daily  . metoprolol tartrate  75 mg Oral BID  . nicotine  21 mg Transdermal Daily  . oxybutynin  5 mg Oral BID  . polyethylene glycol  17 g Oral BID  . sodium chloride  10-40 mL Intracatheter Q12H  . sodium chloride  10-40 mL Intracatheter Q12H  . sodium chloride  3 mL Intravenous Q12H   acetaminophen, diphenhydrAMINE, gi cocktail, guaiFENesin-dextromethorphan, hydrALAZINE, HYDROcodone-acetaminophen, HYDROmorphone (DILAUDID) injection, magic mouthwash w/lidocaine, ondansetron (ZOFRAN) IV, sodium chloride, sodium chloride, sorbitol, zolpidem  Assessment/ Plan:  59 year old lady with multiple medical problems initially admitted for left shoulder pain, further work up revealed MSSA bacteremia with septic arthritis of the numerous joints. She underwent arthroscopic irrigation and debridement of the septic arthritis. TEE is negative for endocarditis. She was started on ancef by ID and recommended 8 weeks of treatment. Meanwhile her renal function worsened  Last dialysis treatment Saturday 3/19 hopefully trying to recover   Acute kidney injury watching for recovery  Labs are pending todaay  Anemia stable   Septic joints   stable  Hep C  stable  Diabetes   controlled     LOS: 28 Brandy Houston W @TODAY @11 :29 AM

## 2014-09-05 LAB — BASIC METABOLIC PANEL
Anion gap: 8 (ref 5–15)
BUN: 20 mg/dL (ref 6–23)
CO2: 27 mmol/L (ref 19–32)
Calcium: 8 mg/dL — ABNORMAL LOW (ref 8.4–10.5)
Chloride: 95 mmol/L — ABNORMAL LOW (ref 96–112)
Creatinine, Ser: 6.56 mg/dL — ABNORMAL HIGH (ref 0.50–1.10)
GFR calc Af Amer: 7 mL/min — ABNORMAL LOW (ref >90)
GFR calc non Af Amer: 6 mL/min — ABNORMAL LOW (ref >90)
Glucose, Bld: 123 mg/dL — ABNORMAL HIGH (ref 70–99)
Potassium: 4.3 mmol/L (ref 3.5–5.1)
Sodium: 130 mmol/L — ABNORMAL LOW (ref 135–145)

## 2014-09-05 LAB — URINE CULTURE: Colony Count: >=100000

## 2014-09-05 LAB — GLUCOSE, CAPILLARY
Glucose-Capillary: 146 mg/dL — ABNORMAL HIGH (ref 70–99)
Glucose-Capillary: 175 mg/dL — ABNORMAL HIGH (ref 70–99)
Glucose-Capillary: 118 mg/dL — ABNORMAL HIGH (ref 70–99)
Glucose-Capillary: 164 mg/dL — ABNORMAL HIGH (ref 70–99)

## 2014-09-05 LAB — PROCALCITONIN: Procalcitonin: 0.3 ng/mL

## 2014-09-05 MED ORDER — CIPROFLOXACIN HCL 500 MG PO TABS
500.0000 mg | ORAL_TABLET | ORAL | Status: DC
  Administered 2014-09-05 – 2014-09-09 (×4): 500 mg via ORAL
  Filled 2014-09-05 (×7): qty 1

## 2014-09-05 NOTE — Progress Notes (Signed)
Bladder scan showed 305 ml. Dr. Justin Mend nephrologist notified. Insert foley catheter per physician.

## 2014-09-05 NOTE — Clinical Social Work Note (Signed)
[name], Nurse Case Manager spoke with Brandy Houston regarding rehab placement plans. Patient reports she has been  sitting in the recliner for an hour and also worked with physical therapy recently. Brandy Houston stated she was assisted walking to the nursing station and back to her room with physical therapy.  Patient expressed interest for inpatient rehab and would like to be considered. Patient reported that her son lives with her and he is there most of the time. Brandy Houston expressed if her son is unavailable , she will have support from other family members.   Brandy Houston CSW-Intern [phone]

## 2014-09-05 NOTE — Progress Notes (Signed)
[facility] PROGRESS NOTE  Brandy Houston [id number] DOB: [birth year] DOA: 08/07/2014 PCP: Chari Manning, NP Interim summary: 59 year old lady with multiple medical problems initially admitted for left shoulder pain, further work up revealed MSSA bacteremia with septic arthritis of the numerous joints.  She underwent arthroscopic irrigation and debridement of the septic arthritis.  TEE is negative for endocarditis.  She was started on ancef by ID and recommended 8 weeks of treatment. Meanwhile her renal function worsened and renal consulted.  She was started on HD sessions.   Assessment/Plan: 1.  MSSA BACTEREMIA with septic arthritis: Currently on cefazolin to continue a total of 8 weeks of treatment.  She had episode of fever 48 hours before, possibly from UTI, urine cultures showed pseudomonas sensitive to ciprofloxacin. cipro started . Pain control.  ID and orthopedics are available as needed.    2. 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  Watching the renal function to improve.  Further management as per renal.  Creatinine is around 6 today.  Diabetes mellitus; CBG (last 3)   Recent Labs  09/05/14 0724 09/05/14 1127 09/05/14 1642  GLUCAP 146* 175* 118*    Good control of blood sugars.  Resume SSI. No changes in diabetes management.    Hepatitis C antibody positive: further management with ID as outpatient.    Anemia; Anemia probably from chronic disease.  epo as per renal. She received a dose of ferra heme  to improve her iron levels.  Transfuse to keep hemoglobin greater than 7. Last hemoglobin is 8.1   Severe hypoalbuminemia: Nutrition supplementation.    Tobacco abuse: Counseling given.     Hypertension:  well controlled. Resume the same dose of anti hypertensives.    Mild hyponatremia; HD should correct it.   Urinary retention: Persistent, and ordered oxybutynin.  Urology consulted. Foley catheter placed.    Fever: Resolved.  Probably from UTI.     Code Status: full code.  Family Communication: none at bedside Disposition Plan: pending, plan for SNF when her renal function improves.    Consultants:  Renal  ID  Orthopedics.     Procedures:  HD   Arthroscopic irrigation and debridement of septic arthritis of left knee on 3/5   Arthroscopic extensive synovectomy in all 3 compartments of the left knee ON 3/5  Renal biopsy done on march 4 th 2016 and was found to have C3  Acute diffuse proliferative glomerulonephritis.   Antibiotics:  Ancef and levaquin  HPI/Subjective: Wants to know if she can inpatient rehab. Will request inpatient rehab to evaluate further.  No new complaints. No abdominal pain.   Objective: Filed Vitals:   09/05/14 1743  BP: 148/85  Pulse: 85  Temp: 99.4 F (37.4 C)  Resp: 17    Intake/Output Summary (Last 24 hours) at 09/05/14 1804 Last data filed at 09/05/14 1755  Gross per 24 hour  Intake    810 ml  Output    150 ml  Net    660 ml   Filed Weights   09/02/14 0648 09/03/14 2109 09/04/14 2127  Weight: 98.2 kg (216 lb 7.9 oz) 95.3 kg (210 lb 1.6 oz) 94.892 kg (209 lb 3.2 oz)    Exam:   General:  Alert afebrile comfortable  Cardiovascular: s1s2, RRR   Respiratory: clear to auscultation, no wheezing or rhonchi  Abdomen: soft non tender non distended bowel sounds heard  Musculoskeletal: leg edema 1+  Data Reviewed: Basic Metabolic  Panel:  Recent Labs Lab 08/30/14 0515  09/01/14 0708 09/02/14 0505 09/02/14 0720 09/03/14 0455 09/04/14 1404 09/05/14 0530  NA 132*  < > 133* 131* 130* 131* 131* 130*  K 4.4  < > 4.0 3.6 3.5 3.9 4.1 4.3  CL 99  < > 97 96 96 96 95* 95*  CO2 25  < > 27 25 25 30 29 27   GLUCOSE 99  < > 114* 114* 115* 144* 110* 123*  BUN 33*  < > 19 10 11 10 15 20   CREATININE 6.62*  < > 5.98* 4.54* 4.59* 3.78* 5.65* 6.56*  CALCIUM 7.9*  < > 8.2* 7.9* 7.8* 7.7* 8.2*  8.0*  PHOS 5.4*  --  4.4  --  3.9  --   --   --   < > = values in this interval not displayed. Liver Function Tests:  Recent Labs Lab 08/30/14 0515 09/01/14 0708 09/02/14 0720  ALBUMIN 1.2* 1.3* 1.4*   No results for input(s): LIPASE, AMYLASE in the last 168 hours. No results for input(s): AMMONIA in the last 168 hours. CBC:  Recent Labs Lab 08/31/14 1141 09/01/14 0708 09/02/14 0645 09/04/14 0500  WBC 12.6* 12.4* 9.9 7.7  HGB 8.0* 8.1* 7.8* 8.1*  HCT 25.3* 25.5* 25.2* 26.3*  MCV 93.4 95.1 94.4 96.7  PLT 378 343 360 340   Cardiac Enzymes: No results for input(s): CKTOTAL, CKMB, CKMBINDEX, TROPONINI in the last 168 hours. BNP (last 3 results) No results for input(s): BNP in the last 8760 hours.  ProBNP (last 3 results) No results for input(s): PROBNP in the last 8760 hours.  CBG:  Recent Labs Lab 09/04/14 1636 09/04/14 2137 09/05/14 0724 09/05/14 1127 09/05/14 1642  GLUCAP 103* 101* 146* 175* 118*    Recent Results (from the past 240 hour(s))  Culture, Urine     Status: None   Collection Time: 09/02/14  3:04 PM  Result Value Ref Range Status   Specimen Description URINE, CATHETERIZED  Final   Special Requests NONE  Final   Colony Count   Final    >=100,000 COLONIES/ML Performed at [facility]    Culture   Final    PSEUDOMONAS AERUGINOSA Performed at [facility]    Report Status 09/05/2014 FINAL  Final   Organism ID, Bacteria PSEUDOMONAS AERUGINOSA  Final      Susceptibility   Pseudomonas aeruginosa - MIC*    CEFEPIME 2 SENSITIVE Sensitive     CEFTAZIDIME 4 SENSITIVE Sensitive     CIPROFLOXACIN 0.5 SENSITIVE Sensitive     GENTAMICIN <=1 SENSITIVE Sensitive     IMIPENEM >=16 RESISTANT Resistant     PIP/TAZO 8 SENSITIVE Sensitive     TOBRAMYCIN <=1 SENSITIVE Sensitive     * PSEUDOMONAS AERUGINOSA     Studies: No results found.  Scheduled Meds: . antiseptic oral rinse  7 mL Mouth Rinse q12n4p  . aspirin  81 mg Oral Daily  .   ceFAZolin (ANCEF) IV  1 g Intravenous Q24H  . chlorhexidine  15 mL Mouth Rinse BID  . cyclobenzaprine  5 mg Oral BID  . darbepoetin (ARANESP) injection - DIALYSIS  100 mcg Intravenous Q Tue-HD  . famotidine  20 mg Oral Daily  . heparin subcutaneous  5,000 Units Subcutaneous 3 times per day  . hydrALAZINE  25 mg Oral TID  . insulin aspart  0-9 Units Subcutaneous TID WC  . lanthanum  1,000 mg Oral TID WC  . metoprolol tartrate  75 mg Oral  BID  . nicotine  21 mg Transdermal Daily  . oxybutynin  5 mg Oral BID  . polyethylene glycol  17 g Oral BID  . sodium chloride  10-40 mL Intracatheter Q12H  . sodium chloride  10-40 mL Intracatheter Q12H  . sodium chloride  3 mL Intravenous Q12H   Continuous Infusions: . sodium chloride 10 mL/hr at 09/04/14 0700    Principal Problem:   Staphylococcus aureus bacteremia Active Problems:   Septic joint of left shoulder region   Sinus tachycardia   Septic joint of right knee joint   CKD (chronic kidney disease) stage 3, GFR 30-59 ml/min   Septic joint of right hand   SIRS (systemic inflammatory response syndrome)   Benign essential HTN   Hepatitis C   Acute renal failure   Acute renal failure syndrome   Hep C w/ coma, chronic   Joint infection    Time spent: 25 minutes.     [facility] Pager [phone] If 7PM-7AM, please contact night-coverage at [email or web address], password [facility] 09/05/2014, 6:04 PM  LOS: 29 days

## 2014-09-05 NOTE — Progress Notes (Signed)
[facility] ROUNDING NOTE   Subjective:   Interval History:  No complaints today and appears to be doing well despite anuria  Weight is actually down  Objective:  Vital signs in last 24 hours:  Temp:  [98.3 F (36.8 C)-98.6 F (37 C)] 98.6 F (37 C) (03/22 0843) Pulse Rate:  [77-84] 77 (03/22 0843) Resp:  [16-18] 17 (03/22 0843) BP: (147-156)/(71-78) 148/77 mmHg (03/22 0843) SpO2:  [90 %-92 %] 92 % (03/22 0843) Weight:  [94.892 kg (209 lb 3.2 oz)] 94.892 kg (209 lb 3.2 oz) (03/21 2127)  Weight change: -0.407 kg (-14.4 oz) Filed Weights   09/02/14 0648 09/03/14 2109 09/04/14 2127  Weight: 98.2 kg (216 lb 7.9 oz) 95.3 kg (210 lb 1.6 oz) 94.892 kg (209 lb 3.2 oz)    Intake/Output: I/O last 3 completed shifts: In: 993 [P.O.:580; I.V.:313; IV Piggyback:100] Out: 50 [Urine:50]   Intake/Output this shift:  Total I/O In: 120 [P.O.:120] Out: 0   CVS- RRR RS- CTA ABD- BS present soft non-distended EXT- 2 + edema   Basic Metabolic Panel:  Recent Labs Lab 08/30/14 0515  09/01/14 0708 09/02/14 0505 09/02/14 0720 09/03/14 0455 09/04/14 1404 09/05/14 0530  NA 132*  < > 133* 131* 130* 131* 131* 130*  K 4.4  < > 4.0 3.6 3.5 3.9 4.1 4.3  CL 99  < > 97 96 96 96 95* 95*  CO2 25  < > 27 25 25 30 29 27   GLUCOSE 99  < > 114* 114* 115* 144* 110* 123*  BUN 33*  < > 19 10 11 10 15 20   CREATININE 6.62*  < > 5.98* 4.54* 4.59* 3.78* 5.65* 6.56*  CALCIUM 7.9*  < > 8.2* 7.9* 7.8* 7.7* 8.2* 8.0*  PHOS 5.4*  --  4.4  --  3.9  --   --   --   < > = values in this interval not displayed.  Liver Function Tests:  Recent Labs Lab 08/30/14 0515 09/01/14 0708 09/02/14 0720  ALBUMIN 1.2* 1.3* 1.4*   No results for input(s): LIPASE, AMYLASE in the last 168 hours. No results for input(s): AMMONIA in the last 168 hours.  CBC:  Recent Labs Lab 08/31/14 1141 09/01/14 0708 09/02/14 0645 09/04/14 0500  WBC 12.6* 12.4* 9.9 7.7  HGB 8.0* 8.1* 7.8* 8.1*  HCT 25.3*  25.5* 25.2* 26.3*  MCV 93.4 95.1 94.4 96.7  PLT 378 343 360 340    Cardiac Enzymes: No results for input(s): CKTOTAL, CKMB, CKMBINDEX, TROPONINI in the last 168 hours.  BNP: Invalid input(s): POCBNP  CBG:  Recent Labs Lab 09/04/14 0737 09/04/14 1115 09/04/14 1636 09/04/14 2137 09/05/14 0724  GLUCAP 109* 197* 103* 101* 146*    Microbiology: Results for orders placed or performed during the hospital encounter of 08/07/14  Culture, blood (routine x 2)     Status: None   Collection Time: 08/09/14 12:15 PM  Result Value Ref Range Status   Specimen Description BLOOD RIGHT HAND  Final   Special Requests   Final    BOTTLES DRAWN AEROBIC AND ANAEROBIC 7CCS BOTH BOTTLES   Culture   Final    STAPHYLOCOCCUS AUREUS Note: RIFAMPIN AND GENTAMICIN SHOULD NOT BE USED AS SINGLE DRUGS FOR TREATMENT OF STAPH INFECTIONS. Note: Gram Stain Report Called to,Read Back By and Verified With: Shirlean Kelly 700AM 08/10/14 [place] Performed at [facility]    Report Status 08/12/2014 FINAL  Final   Organism ID, Bacteria STAPHYLOCOCCUS AUREUS  Final  Susceptibility   Staphylococcus aureus - MIC*    CLINDAMYCIN <=0.25 SENSITIVE Sensitive     ERYTHROMYCIN <=0.25 SENSITIVE Sensitive     GENTAMICIN <=0.5 SENSITIVE Sensitive     LEVOFLOXACIN 0.25 SENSITIVE Sensitive     OXACILLIN <=0.25 SENSITIVE Sensitive     PENICILLIN >=0.5 RESISTANT Resistant     RIFAMPIN <=0.5 SENSITIVE Sensitive     TRIMETH/SULFA <=10 SENSITIVE Sensitive     VANCOMYCIN 1 SENSITIVE Sensitive     TETRACYCLINE <=1 SENSITIVE Sensitive     MOXIFLOXACIN <=0.25 SENSITIVE Sensitive     * STAPHYLOCOCCUS AUREUS  Culture, blood (routine x 2)     Status: None   Collection Time: 08/09/14 12:25 PM  Result Value Ref Range Status   Specimen Description BLOOD RIGHT ARM  Final   Special Requests   Final    BOTTLES DRAWN AEROBIC AND ANAEROBIC 10CC BOTH BOTTLES   Culture   Final    STAPHYLOCOCCUS AUREUS Note:  SUSCEPTIBILITIES PERFORMED ON PREVIOUS CULTURE WITHIN THE LAST 5 DAYS. Note: Gram Stain Report Called to,Read Back By and Verified With: Shirlean Kelly 700AM 08/10/14 [place] Performed at [facility]    Report Status 08/12/2014 FINAL  Final  Culture, routine-abscess     Status: None   Collection Time: 08/10/14 11:54 AM  Result Value Ref Range Status   Specimen Description SHOULDER LEFT  Final   Special Requests NONE  Final   Gram Stain   Final    ABUNDANT WBC PRESENT, PREDOMINANTLY PMN NO SQUAMOUS EPITHELIAL CELLS SEEN MODERATE GRAM POSITIVE COCCI IN PAIRS IN CLUSTERS Performed at [facility]    Culture   Final    MODERATE STAPHYLOCOCCUS AUREUS Note: RIFAMPIN AND GENTAMICIN SHOULD NOT BE USED AS SINGLE DRUGS FOR TREATMENT OF STAPH INFECTIONS. Performed at [facility]    Report Status 08/13/2014 FINAL  Final   Organism ID, Bacteria STAPHYLOCOCCUS AUREUS  Final      Susceptibility   Staphylococcus aureus - MIC*    CLINDAMYCIN <=0.25 SENSITIVE Sensitive     ERYTHROMYCIN <=0.25 SENSITIVE Sensitive     GENTAMICIN <=0.5 SENSITIVE Sensitive     LEVOFLOXACIN 0.25 SENSITIVE Sensitive     OXACILLIN 0.5 SENSITIVE Sensitive     PENICILLIN >=0.5 RESISTANT Resistant     RIFAMPIN <=0.5 SENSITIVE Sensitive     TRIMETH/SULFA <=10 SENSITIVE Sensitive     VANCOMYCIN 1 SENSITIVE Sensitive     TETRACYCLINE <=1 SENSITIVE Sensitive     MOXIFLOXACIN <=0.25 SENSITIVE Sensitive     * MODERATE STAPHYLOCOCCUS AUREUS  Anaerobic culture     Status: None   Collection Time: 08/10/14 11:54 AM  Result Value Ref Range Status   Specimen Description SHOULDER LEFT  Final   Special Requests NONE  Final   Gram Stain   Final    MODERATE WBC PRESENT, PREDOMINANTLY PMN NO SQUAMOUS EPITHELIAL CELLS SEEN MODERATE GRAM POSITIVE COCCI IN PAIRS IN CLUSTERS Performed at [facility]    Culture   Final    NO ANAEROBES ISOLATED Note: NO PROPIONIBACTERIUM ISOLATED Performed at  [facility]    Report Status 08/25/2014 FINAL  Final  Body fluid culture     Status: None   Collection Time: 08/10/14  5:22 PM  Result Value Ref Range Status   Specimen Description SYNOVIAL JOINT RIGHT KNEE  Final   Special Requests Normal  Final   Gram Stain   Final    ABUNDANT WBC PRESENT,BOTH PMN AND MONONUCLEAR NO ORGANISMS SEEN Performed at  [facility]   Final    FEW STAPHYLOCOCCUS AUREUS Note: RIFAMPIN AND GENTAMICIN SHOULD NOT BE USED AS SINGLE DRUGS FOR TREATMENT OF STAPH INFECTIONS. CRITICAL RESULT CALLED TO, READ BACK BY AND VERIFIED WITH: CINDY FLORES RN 08/13/14 AT 108 AM BY [facility] Performed at [facility]    Report Status 08/14/2014 FINAL  Final   Organism ID, Bacteria STAPHYLOCOCCUS AUREUS  Final      Susceptibility   Staphylococcus aureus - MIC*    CLINDAMYCIN <=0.25 SENSITIVE Sensitive     ERYTHROMYCIN <=0.25 SENSITIVE Sensitive     GENTAMICIN <=0.5 SENSITIVE Sensitive     LEVOFLOXACIN 0.25 SENSITIVE Sensitive     OXACILLIN 0.5 SENSITIVE Sensitive     PENICILLIN >=0.5 RESISTANT Resistant     RIFAMPIN <=0.5 SENSITIVE Sensitive     TRIMETH/SULFA <=10 SENSITIVE Sensitive     VANCOMYCIN <=0.5 SENSITIVE Sensitive     TETRACYCLINE <=1 SENSITIVE Sensitive     MOXIFLOXACIN <=0.25 SENSITIVE Sensitive     * FEW STAPHYLOCOCCUS AUREUS  Surgical pcr screen     Status: Abnormal   Collection Time: 08/10/14  6:42 PM  Result Value Ref Range Status   MRSA, PCR NEGATIVE NEGATIVE Final   Staphylococcus aureus POSITIVE (A) NEGATIVE Final    Comment:        The Xpert SA Assay (FDA approved for NASAL specimens in patients over 30 years of age), is one component of a comprehensive surveillance program.  Test performance has been validated by [facility] for patients greater than or equal to 73 year old. It is not intended to diagnose infection nor to guide or monitor treatment.   Body fluid culture     Status: None   Collection Time:  08/10/14  9:14 PM  Result Value Ref Range Status   Specimen Description FLUID LEFT AC JOINT  Final   Special Requests VANCO, ROCEPHIN  Final   Gram Stain   Final    FEW WBC PRESENT, PREDOMINANTLY PMN MODERATE GRAM POSITIVE COCCI IN PAIRS IN CLUSTERS Gram Stain Report Called to,Read Back By and Verified With: Gram Stain Report Called to,Read Back By and Verified With: Laurance Flatten RN 08/11/13 9:10AM BY [place] Performed at [facility]    Culture   Final    ABUNDANT STAPHYLOCOCCUS AUREUS Note: RIFAMPIN AND GENTAMICIN SHOULD NOT BE USED AS SINGLE DRUGS FOR TREATMENT OF STAPH INFECTIONS. Performed at [facility]    Report Status 08/13/2014 FINAL  Final   Organism ID, Bacteria STAPHYLOCOCCUS AUREUS  Final      Susceptibility   Staphylococcus aureus - MIC*    CLINDAMYCIN <=0.25 SENSITIVE Sensitive     ERYTHROMYCIN <=0.25 SENSITIVE Sensitive     GENTAMICIN <=0.5 SENSITIVE Sensitive     LEVOFLOXACIN 0.25 SENSITIVE Sensitive     OXACILLIN <=0.25 SENSITIVE Sensitive     PENICILLIN >=0.5 RESISTANT Resistant     RIFAMPIN <=0.5 SENSITIVE Sensitive     TRIMETH/SULFA <=10 SENSITIVE Sensitive     VANCOMYCIN 1 SENSITIVE Sensitive     TETRACYCLINE <=1 SENSITIVE Sensitive     MOXIFLOXACIN <=0.25 SENSITIVE Sensitive     * ABUNDANT STAPHYLOCOCCUS AUREUS  Anaerobic culture     Status: None   Collection Time: 08/10/14  9:14 PM  Result Value Ref Range Status   Specimen Description FLUID LEFT AC JOINT  Final   Special Requests VANCO, ROCEPHIN  Final   Gram Stain   Final    FEW WBC PRESENT, PREDOMINANTLY  PMN MODERATE GRAM POSITIVE COCCI IN PAIRS IN CLUSTERS Performed at [facility]    Culture   Final    NO ANAEROBES ISOLATED Performed at [facility]    Report Status 08/15/2014 FINAL  Final  Culture, blood (routine x 2)     Status: None   Collection Time: 08/11/14  7:00 AM  Result Value Ref Range Status   Specimen Description BLOOD RIGHT ARM  Final    Special Requests BOTTLES DRAWN AEROBIC AND ANAEROBIC 6CC  Final   Culture   Final    NO GROWTH 5 DAYS Performed at [facility]    Report Status 08/17/2014 FINAL  Final  Culture, blood (routine x 2)     Status: None   Collection Time: 08/11/14  7:08 AM  Result Value Ref Range Status   Specimen Description BLOOD LEFT HAND  Final   Special Requests BOTTLES DRAWN AEROBIC AND ANAEROBIC [place]  Final   Culture   Final    NO GROWTH 5 DAYS Performed at [facility]    Report Status 08/17/2014 FINAL  Final  Culture, Urine     Status: None   Collection Time: 08/12/14  5:47 AM  Result Value Ref Range Status   Specimen Description URINE, RANDOM  Final   Special Requests NONE  Final   Colony Count   Final    3,000 COLONIES/ML Performed at [facility]    Culture   Final    INSIGNIFICANT GROWTH Performed at [facility]    Report Status 08/13/2014 FINAL  Final  Culture, blood (routine x 2)     Status: None   Collection Time: 08/15/14  1:15 PM  Result Value Ref Range Status   Specimen Description BLOOD RIGHT HAND  Final   Special Requests BOTTLES DRAWN AEROBIC ONLY 3CC  Final   Culture   Final    NO GROWTH 5 DAYS Performed at [facility]    Report Status 08/21/2014 FINAL  Final  Culture, blood (routine x 2)     Status: None   Collection Time: 08/15/14  3:11 PM  Result Value Ref Range Status   Specimen Description BLOOD RIGHT HAND  Final   Special Requests   Final    BOTTLES DRAWN AEROBIC AND ANAEROBIC BLUE 10CC RED 5CC   Culture   Final    NO GROWTH 5 DAYS Performed at [facility]    Report Status 08/21/2014 FINAL  Final  Culture, Urine     Status: None   Collection Time: 08/17/14  3:14 PM  Result Value Ref Range Status   Specimen Description URINE, CATHETERIZED  Final   Special Requests NONE  Final   Colony Count NO GROWTH Performed at [facility]   Final   Culture NO GROWTH Performed at [facility]    Final   Report Status 08/18/2014 FINAL  Final  Body fluid culture     Status: None   Collection Time: 08/18/14  4:52 PM  Result Value Ref Range Status   Specimen Description FLUID SYNOVIAL LEFT KNEE  Final   Special Requests Normal  Final   Gram Stain   Final    MODERATE WBC PRESENT,BOTH PMN AND MONONUCLEAR NO ORGANISMS SEEN Performed at [facility]    Culture   Final    NO GROWTH 3 DAYS Performed at [facility]    Report Status 08/22/2014 FINAL  Final  Culture, Urine     Status:  None   Collection Time: 09/02/14  3:04 PM  Result Value Ref Range Status   Specimen Description URINE, CATHETERIZED  Final   Special Requests NONE  Final   Colony Count   Final    >=100,000 COLONIES/ML Performed at [facility]    Culture   Final    PSEUDOMONAS AERUGINOSA Performed at [facility]    Report Status 09/05/2014 FINAL  Final   Organism ID, Bacteria PSEUDOMONAS AERUGINOSA  Final      Susceptibility   Pseudomonas aeruginosa - MIC*    CEFEPIME 2 SENSITIVE Sensitive     CEFTAZIDIME 4 SENSITIVE Sensitive     CIPROFLOXACIN 0.5 SENSITIVE Sensitive     GENTAMICIN <=1 SENSITIVE Sensitive     IMIPENEM >=16 RESISTANT Resistant     PIP/TAZO 8 SENSITIVE Sensitive     TOBRAMYCIN <=1 SENSITIVE Sensitive     * PSEUDOMONAS AERUGINOSA    Coagulation Studies: No results for input(s): LABPROT, INR in the last 72 hours.  Urinalysis:  Recent Labs  09/02/14 1504  COLORURINE RED*  LABSPEC 1.026  PHURINE 7.0  GLUCOSEU NEGATIVE  HGBUR LARGE*  BILIRUBINUR SMALL*  KETONESUR 15*  PROTEINUR >300*  UROBILINOGEN 0.2  NITRITE POSITIVE*  LEUKOCYTESUR LARGE*      Imaging: No results found.   Medications:   . sodium chloride 10 mL/hr at 09/04/14 0700   . antiseptic oral rinse  7 mL Mouth Rinse q12n4p  . aspirin  81 mg Oral Daily  .  ceFAZolin (ANCEF) IV  1 g Intravenous Q24H  . chlorhexidine  15 mL Mouth Rinse BID  . cyclobenzaprine  5 mg Oral BID  .  darbepoetin (ARANESP) injection - DIALYSIS  100 mcg Intravenous Q Tue-HD  . famotidine  20 mg Oral Daily  . heparin subcutaneous  5,000 Units Subcutaneous 3 times per day  . hydrALAZINE  25 mg Oral TID  . insulin aspart  0-9 Units Subcutaneous TID WC  . lanthanum  1,000 mg Oral TID WC  . levofloxacin  250 mg Oral Daily  . metoprolol tartrate  75 mg Oral BID  . nicotine  21 mg Transdermal Daily  . oxybutynin  5 mg Oral BID  . polyethylene glycol  17 g Oral BID  . sodium chloride  10-40 mL Intracatheter Q12H  . sodium chloride  10-40 mL Intracatheter Q12H  . sodium chloride  3 mL Intravenous Q12H   sodium chloride, sodium chloride, acetaminophen, diphenhydrAMINE, feeding supplement (NEPRO CARB STEADY), gi cocktail, guaiFENesin-dextromethorphan, heparin, heparin, hydrALAZINE, HYDROcodone-acetaminophen, HYDROmorphone (DILAUDID) injection, lidocaine (PF), lidocaine-prilocaine, magic mouthwash w/lidocaine, ondansetron (ZOFRAN) IV, pentafluoroprop-tetrafluoroeth, sodium chloride, sodium chloride, sorbitol, zolpidem  Assessment/ Plan:  59 year old lady with multiple medical problems initially admitted for left shoulder pain, further work up revealed MSSA bacteremia with septic arthritis of the numerous joints. She underwent arthroscopic irrigation and debridement of the septic arthritis. TEE is negative for endocarditis. She was started on ancef by ID and recommended 8 weeks of treatment. 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

## 2014-09-05 NOTE — Progress Notes (Signed)
Physical Therapy Treatment Patient Details Name: Brandy Houston MRN: [id number] DOB: April 18, 1956 Today's Date: 09/05/2014    History of Present Illness MSSA bacteremia with Polyarticular septic arthrits  s/p I&D of BUE's AC joint, BLE knee joint, and right hand    PT Comments    Pt progressing towards physical therapy goals. Was able to tolerate increased ambulation distance with 1 seated rest break. Close chair follow utilized as pt fatigues quickly and requires assist for support/balance towards the end of gait training. Pt was educated on benefits of continued follow-up with HEP and reviewed specific exercises, technique, sets, reps. Will continue to follow.   Follow Up Recommendations  SNF     Equipment Recommendations  None recommended by PT    Recommendations for Other Services       Precautions / Restrictions Precautions Precautions: Fall Restrictions Weight Bearing Restrictions: No RLE Weight Bearing: Weight bearing as tolerated LLE Weight Bearing: Weight bearing as tolerated    Mobility  Bed Mobility Overal bed mobility: Needs Assistance Bed Mobility: Supine to Sit     Supine to sit: Min guard     General bed mobility comments: Pt was able to transition to EOB with heavy use of bed rails and no support from therapist.   Transfers Overall transfer level: Needs assistance Equipment used: Rolling walker (2 wheeled) Transfers: Sit to/from Stand Sit to Stand: Min assist;Mod assist;+2 physical assistance         General transfer comment: Pt was able to power-up to full stand with min assist +2 from the bed, and mod assist +2 from the recliner after a seated rest break during ambulation. Pt fatigues quickly.   Ambulation/Gait Ambulation/Gait assistance: Min assist Ambulation Distance (Feet): 40 Feet Assistive device: Rolling walker (2 wheeled) Gait Pattern/deviations: Step-through pattern;Decreased stride length;Trunk flexed;Narrow base of support Gait velocity:  Decreased Gait velocity interpretation: Below normal speed for age/gender General Gait Details: Pt requires frequent cueing for improved posture. Was able to progress to step-through gait pattern at times, however often demonstrated a step to pattern due to L knee pain. 1 seated rest break due to fatigue.    Stairs            Wheelchair Mobility    Modified Rankin (Stroke Patients Only)       Balance Overall balance assessment: Needs assistance Sitting-balance support: No upper extremity supported;Feet supported Sitting balance-Leahy Scale: Good     Standing balance support: Bilateral upper extremity supported Standing balance-Leahy Scale: Poor                      Cognition Arousal/Alertness: Awake/alert Behavior During Therapy: WFL for tasks assessed/performed Overall Cognitive Status: Within Functional Limits for tasks assessed                      Exercises      General Comments General comments (skin integrity, edema, etc.): Reviewed HEP with pt and encouraged x2-3 sets/day      Pertinent Vitals/Pain Pain Assessment: Faces Faces Pain Scale: Hurts even more Pain Location: knees Pain Descriptors / Indicators: Grimacing;Guarding Pain Intervention(s): Limited activity within patient's tolerance;Monitored during session;Repositioned    Home Living                      Prior Function            PT Goals (current goals can now be found in the care plan section) Acute Rehab PT Goals Patient Stated  Goal: go home, feel better PT Goal Formulation: With patient Time For Goal Achievement: 09/03/14 Potential to Achieve Goals: Good Progress towards PT goals: Progressing toward goals    Frequency  Min 2X/week    PT Plan Current plan remains appropriate    Co-evaluation             End of Session Equipment Utilized During Treatment: Gait belt Activity Tolerance: Patient limited by pain;Patient limited by fatigue Patient left:  in chair;with call bell/phone within reach     Time: 1003-1028 PT Time Calculation (min) (ACUTE ONLY): 25 min  Charges:  $Gait Training: 8-22 mins $Therapeutic Activity: 8-22 mins                    G Codes:      Rolinda Roan Sep 08, 2014, 12:04 PM   Rolinda Roan, PT, DPT Acute Rehabilitation Services Pager: [phone]

## 2014-09-05 NOTE — Progress Notes (Signed)
PHARMACY NOTE  Pharmacy Consult for :  Cipro Indication:  Pseudomonas UTI  Hospital Problems Principal Problem:   Staphylococcus aureus bacteremia Active Problems:   Septic joint of left shoulder region   Sinus tachycardia   Septic joint of right knee joint   CKD (chronic kidney disease) stage 3, GFR 30-59 ml/min   Septic joint of right hand   SIRS (systemic inflammatory response syndrome)   Benign essential HTN   Hepatitis C   Acute renal failure   Acute renal failure syndrome   Hep C w/ coma, chronic   Joint infection  Dosing Weight: 95 kg  Labs:  Recent Labs  09/03/14 0455 09/04/14 0500 09/04/14 1404 09/05/14 0530  WBC  --  7.7  --   --   HGB  --  8.1*  --   --   PLT  --  340  --   --   CREATININE 3.78*  --  5.65* 6.56*    Estimated Creatinine Clearance: 11 mL/min (by C-G formula based on Cr of 6.56).   Microbiology: Recent Results (from the past 720 hour(s))  Culture, blood (routine x 2)     Status: None   Collection Time: 08/09/14 12:15 PM  Result Value Ref Range Status   Specimen Description BLOOD RIGHT HAND  Final   Special Requests   Final    BOTTLES DRAWN AEROBIC AND ANAEROBIC 7CCS BOTH BOTTLES   Culture   Final    STAPHYLOCOCCUS AUREUS Note: RIFAMPIN AND GENTAMICIN SHOULD NOT BE USED AS SINGLE DRUGS FOR TREATMENT OF STAPH INFECTIONS. Note: Gram Stain Report Called to,Read Back By and Verified With: Shirlean Kelly 700AM 08/10/14 [place] Performed at [facility]    Report Status 08/12/2014 FINAL  Final   Organism ID, Bacteria STAPHYLOCOCCUS AUREUS  Final      Susceptibility   Staphylococcus aureus - MIC*    CLINDAMYCIN <=0.25 SENSITIVE Sensitive     ERYTHROMYCIN <=0.25 SENSITIVE Sensitive     GENTAMICIN <=0.5 SENSITIVE Sensitive     LEVOFLOXACIN 0.25 SENSITIVE Sensitive     OXACILLIN <=0.25 SENSITIVE Sensitive     PENICILLIN >=0.5 RESISTANT Resistant     RIFAMPIN <=0.5 SENSITIVE Sensitive     TRIMETH/SULFA <=10  SENSITIVE Sensitive     VANCOMYCIN 1 SENSITIVE Sensitive     TETRACYCLINE <=1 SENSITIVE Sensitive     MOXIFLOXACIN <=0.25 SENSITIVE Sensitive     * STAPHYLOCOCCUS AUREUS  Culture, blood (routine x 2)     Status: None   Collection Time: 08/09/14 12:25 PM  Result Value Ref Range Status   Specimen Description BLOOD RIGHT ARM  Final   Special Requests   Final    BOTTLES DRAWN AEROBIC AND ANAEROBIC 10CC BOTH BOTTLES   Culture   Final    STAPHYLOCOCCUS AUREUS Note: SUSCEPTIBILITIES PERFORMED ON PREVIOUS CULTURE WITHIN THE LAST 5 DAYS. Note: Gram Stain Report Called to,Read Back By and Verified With: Shirlean Kelly 700AM 08/10/14 [place] Performed at [facility]    Report Status 08/12/2014 FINAL  Final  Culture, routine-abscess     Status: None   Collection Time: 08/10/14 11:54 AM  Result Value Ref Range Status   Specimen Description SHOULDER LEFT  Final   Special Requests NONE  Final   Gram Stain   Final    ABUNDANT WBC PRESENT, PREDOMINANTLY PMN NO SQUAMOUS EPITHELIAL CELLS SEEN MODERATE GRAM POSITIVE COCCI IN PAIRS IN CLUSTERS Performed at [facility]    Culture   Final  MODERATE STAPHYLOCOCCUS AUREUS Note: RIFAMPIN AND GENTAMICIN SHOULD NOT BE USED AS SINGLE DRUGS FOR TREATMENT OF STAPH INFECTIONS. Performed at [facility]    Report Status 08/13/2014 FINAL  Final   Organism ID, Bacteria STAPHYLOCOCCUS AUREUS  Final      Susceptibility   Staphylococcus aureus - MIC*    CLINDAMYCIN <=0.25 SENSITIVE Sensitive     ERYTHROMYCIN <=0.25 SENSITIVE Sensitive     GENTAMICIN <=0.5 SENSITIVE Sensitive     LEVOFLOXACIN 0.25 SENSITIVE Sensitive     OXACILLIN 0.5 SENSITIVE Sensitive     PENICILLIN >=0.5 RESISTANT Resistant     RIFAMPIN <=0.5 SENSITIVE Sensitive     TRIMETH/SULFA <=10 SENSITIVE Sensitive     VANCOMYCIN 1 SENSITIVE Sensitive     TETRACYCLINE <=1 SENSITIVE Sensitive     MOXIFLOXACIN <=0.25 SENSITIVE Sensitive     * MODERATE STAPHYLOCOCCUS  AUREUS  Anaerobic culture     Status: None   Collection Time: 08/10/14 11:54 AM  Result Value Ref Range Status   Specimen Description SHOULDER LEFT  Final   Special Requests NONE  Final   Gram Stain   Final    MODERATE WBC PRESENT, PREDOMINANTLY PMN NO SQUAMOUS EPITHELIAL CELLS SEEN MODERATE GRAM POSITIVE COCCI IN PAIRS IN CLUSTERS Performed at [facility]    Culture   Final    NO ANAEROBES ISOLATED Note: NO PROPIONIBACTERIUM ISOLATED Performed at [facility]    Report Status 08/25/2014 FINAL  Final  Body fluid culture     Status: None   Collection Time: 08/10/14  5:22 PM  Result Value Ref Range Status   Specimen Description SYNOVIAL JOINT RIGHT KNEE  Final   Special Requests Normal  Final   Gram Stain   Final    ABUNDANT WBC PRESENT,BOTH PMN AND MONONUCLEAR NO ORGANISMS SEEN Performed at [facility]    Culture   Final    FEW STAPHYLOCOCCUS AUREUS Note: RIFAMPIN AND GENTAMICIN SHOULD NOT BE USED AS SINGLE DRUGS FOR TREATMENT OF STAPH INFECTIONS. CRITICAL RESULT CALLED TO, READ BACK BY AND VERIFIED WITH: CINDY FLORES RN 08/13/14 AT 42 AM BY [facility] Performed at [facility]    Report Status 08/14/2014 FINAL  Final   Organism ID, Bacteria STAPHYLOCOCCUS AUREUS  Final      Susceptibility   Staphylococcus aureus - MIC*    CLINDAMYCIN <=0.25 SENSITIVE Sensitive     ERYTHROMYCIN <=0.25 SENSITIVE Sensitive     GENTAMICIN <=0.5 SENSITIVE Sensitive     LEVOFLOXACIN 0.25 SENSITIVE Sensitive     OXACILLIN 0.5 SENSITIVE Sensitive     PENICILLIN >=0.5 RESISTANT Resistant     RIFAMPIN <=0.5 SENSITIVE Sensitive     TRIMETH/SULFA <=10 SENSITIVE Sensitive     VANCOMYCIN <=0.5 SENSITIVE Sensitive     TETRACYCLINE <=1 SENSITIVE Sensitive     MOXIFLOXACIN <=0.25 SENSITIVE Sensitive     * FEW STAPHYLOCOCCUS AUREUS  Surgical pcr screen     Status: Abnormal   Collection Time: 08/10/14  6:42 PM  Result Value Ref Range Status   MRSA, PCR NEGATIVE  NEGATIVE Final   Staphylococcus aureus POSITIVE (A) NEGATIVE Final    Comment:        The Xpert SA Assay (FDA approved for NASAL specimens in patients over 49 years of age), is one component of a comprehensive surveillance program.  Test performance has been validated by [facility] for patients greater than or equal to 46 year old. It is not intended to diagnose infection nor to guide or monitor  treatment.   Body fluid culture     Status: None   Collection Time: 08/10/14  9:14 PM  Result Value Ref Range Status   Specimen Description FLUID LEFT AC JOINT  Final   Special Requests VANCO, ROCEPHIN  Final   Gram Stain   Final    FEW WBC PRESENT, PREDOMINANTLY PMN MODERATE GRAM POSITIVE COCCI IN PAIRS IN CLUSTERS Gram Stain Report Called to,Read Back By and Verified With: Gram Stain Report Called to,Read Back By and Verified With: Laurance Flatten RN 08/11/13 9:10AM BY [place] Performed at [facility]    Culture   Final    ABUNDANT STAPHYLOCOCCUS AUREUS Note: RIFAMPIN AND GENTAMICIN SHOULD NOT BE USED AS SINGLE DRUGS FOR TREATMENT OF STAPH INFECTIONS. Performed at [facility]    Report Status 08/13/2014 FINAL  Final   Organism ID, Bacteria STAPHYLOCOCCUS AUREUS  Final      Susceptibility   Staphylococcus aureus - MIC*    CLINDAMYCIN <=0.25 SENSITIVE Sensitive     ERYTHROMYCIN <=0.25 SENSITIVE Sensitive     GENTAMICIN <=0.5 SENSITIVE Sensitive     LEVOFLOXACIN 0.25 SENSITIVE Sensitive     OXACILLIN <=0.25 SENSITIVE Sensitive     PENICILLIN >=0.5 RESISTANT Resistant     RIFAMPIN <=0.5 SENSITIVE Sensitive     TRIMETH/SULFA <=10 SENSITIVE Sensitive     VANCOMYCIN 1 SENSITIVE Sensitive     TETRACYCLINE <=1 SENSITIVE Sensitive     MOXIFLOXACIN <=0.25 SENSITIVE Sensitive     * ABUNDANT STAPHYLOCOCCUS AUREUS  Anaerobic culture     Status: None   Collection Time: 08/10/14  9:14 PM  Result Value Ref Range Status   Specimen Description FLUID LEFT AC JOINT  Final    Special Requests VANCO, ROCEPHIN  Final   Gram Stain   Final    FEW WBC PRESENT, PREDOMINANTLY PMN MODERATE GRAM POSITIVE COCCI IN PAIRS IN CLUSTERS Performed at [facility]    Culture   Final    NO ANAEROBES ISOLATED Performed at [facility]    Report Status 08/15/2014 FINAL  Final  Culture, blood (routine x 2)     Status: None   Collection Time: 08/11/14  7:00 AM  Result Value Ref Range Status   Specimen Description BLOOD RIGHT ARM  Final   Special Requests BOTTLES DRAWN AEROBIC AND ANAEROBIC 6CC  Final   Culture   Final    NO GROWTH 5 DAYS Performed at [facility]    Report Status 08/17/2014 FINAL  Final  Culture, blood (routine x 2)     Status: None   Collection Time: 08/11/14  7:08 AM  Result Value Ref Range Status   Specimen Description BLOOD LEFT HAND  Final   Special Requests BOTTLES DRAWN AEROBIC AND ANAEROBIC [place]  Final   Culture   Final    NO GROWTH 5 DAYS Performed at [facility]    Report Status 08/17/2014 FINAL  Final  Culture, Urine     Status: None   Collection Time: 08/12/14  5:47 AM  Result Value Ref Range Status   Specimen Description URINE, RANDOM  Final   Special Requests NONE  Final   Colony Count   Final    3,000 COLONIES/ML Performed at [facility]    Culture   Final    INSIGNIFICANT GROWTH Performed at [facility]    Report Status 08/13/2014 FINAL  Final  Culture, blood (routine x 2)     Status: None   Collection Time:  08/15/14  1:15 PM  Result Value Ref Range Status   Specimen Description BLOOD RIGHT HAND  Final   Special Requests BOTTLES DRAWN AEROBIC ONLY 3CC  Final   Culture   Final    NO GROWTH 5 DAYS Performed at [facility]    Report Status 08/21/2014 FINAL  Final  Culture, blood (routine x 2)     Status: None   Collection Time: 08/15/14  3:11 PM  Result Value Ref Range Status   Specimen Description BLOOD RIGHT HAND  Final   Special Requests   Final    BOTTLES  DRAWN AEROBIC AND ANAEROBIC BLUE 10CC RED 5CC   Culture   Final    NO GROWTH 5 DAYS Performed at [facility]    Report Status 08/21/2014 FINAL  Final  Culture, Urine     Status: None   Collection Time: 08/17/14  3:14 PM  Result Value Ref Range Status   Specimen Description URINE, CATHETERIZED  Final   Special Requests NONE  Final   Colony Count NO GROWTH Performed at [facility]   Final   Culture NO GROWTH Performed at [facility]   Final   Report Status 08/18/2014 FINAL  Final  Body fluid culture     Status: None   Collection Time: 08/18/14  4:52 PM  Result Value Ref Range Status   Specimen Description FLUID SYNOVIAL LEFT KNEE  Final   Special Requests Normal  Final   Gram Stain   Final    MODERATE WBC PRESENT,BOTH PMN AND MONONUCLEAR NO ORGANISMS SEEN Performed at [facility]    Culture   Final    NO GROWTH 3 DAYS Performed at [facility]    Report Status 08/22/2014 FINAL  Final  Culture, Urine     Status: None   Collection Time: 09/02/14  3:04 PM  Result Value Ref Range Status   Specimen Description URINE, CATHETERIZED  Final   Special Requests NONE  Final   Colony Count   Final    >=100,000 COLONIES/ML Performed at [facility]    Culture   Final    PSEUDOMONAS AERUGINOSA Performed at [facility]    Report Status 09/05/2014 FINAL  Final   Organism ID, Bacteria PSEUDOMONAS AERUGINOSA  Final      Susceptibility   Pseudomonas aeruginosa - MIC*    CEFEPIME 2 SENSITIVE Sensitive     CEFTAZIDIME 4 SENSITIVE Sensitive     CIPROFLOXACIN 0.5 SENSITIVE Sensitive     GENTAMICIN <=1 SENSITIVE Sensitive     IMIPENEM >=16 RESISTANT Resistant     PIP/TAZO 8 SENSITIVE Sensitive     TOBRAMYCIN <=1 SENSITIVE Sensitive     * PSEUDOMONAS AERUGINOSA   Current Medication[s] Include: Scheduled:  Scheduled:  . antiseptic oral rinse  7 mL Mouth Rinse q12n4p  . aspirin  81 mg Oral Daily  .  ceFAZolin (ANCEF) IV  1  g Intravenous Q24H  . chlorhexidine  15 mL Mouth Rinse BID  . ciprofloxacin  500 mg Oral Q24H  . cyclobenzaprine  5 mg Oral BID  . darbepoetin (ARANESP) injection - DIALYSIS  100 mcg Intravenous Q Tue-HD  . famotidine  20 mg Oral Daily  . heparin subcutaneous  5,000 Units Subcutaneous 3 times per day  . hydrALAZINE  25 mg Oral TID  . insulin aspart  0-9 Units Subcutaneous TID WC  . lanthanum  1,000 mg Oral TID WC  . metoprolol tartrate  75 mg  Oral BID  . nicotine  21 mg Transdermal Daily  . oxybutynin  5 mg Oral BID  . polyethylene glycol  17 g Oral BID  . sodium chloride  10-40 mL Intracatheter Q12H  . sodium chloride  10-40 mL Intracatheter Q12H  . sodium chloride  3 mL Intravenous Q12H   Antibiotic[s]: Anti-infectives    Start     Dose/Rate Route Frequency Ordered Stop   09/05/14 2000  ciprofloxacin (CIPRO) tablet 500 mg     500 mg Oral Every 24 hours 09/05/14 1820     09/04/14 1100  levofloxacin (LEVAQUIN) tablet 250 mg  Status:  Discontinued    Comments:  For UTI   250 mg Oral Daily 09/04/14 1019 09/05/14 1803   09/02/14 1800  ceFAZolin (ANCEF) IVPB 1 g/50 mL premix     1 g 100 mL/hr over 30 Minutes Intravenous Every 24 hours 09/01/14 1457     08/29/14 0900  ceFAZolin (ANCEF) IVPB 2 g/50 mL premix     2 g 100 mL/hr over 30 Minutes Intravenous  Once 08/29/14 0854 08/29/14 0925   08/29/14 0802  ceFAZolin (ANCEF) 2-3 GM-% IVPB SOLR  Status:  Discontinued    Comments:  Laughlin, Amy   : cabinet override      08/29/14 0802 08/29/14 0815   08/28/14 2200  ceFAZolin (ANCEF) IVPB 2 g/50 mL premix  Status:  Discontinued     2 g 100 mL/hr over 30 Minutes Intravenous Every M-W-F (Hemodialysis) 08/28/14 1501 09/01/14 1457   08/28/14 1200  ceFAZolin (ANCEF) IVPB 2 g/50 mL premix  Status:  Discontinued    Comments:  On call to xray 3/14   2 g 100 mL/hr over 30 Minutes Intravenous On call 08/28/14 0924 08/28/14 1446   08/23/14 2200  fluconazole (DIFLUCAN) tablet 150 mg     150 mg Oral   Once 08/23/14 2151 08/23/14 2252   08/19/14 0500  ceFAZolin (ANCEF) IVPB 2 g/50 mL premix  Status:  Discontinued     2 g 100 mL/hr over 30 Minutes Intravenous Every 12 hours 08/18/14 1524 08/28/14 1501   08/18/14 1800  ceFAZolin (ANCEF) IVPB 2 g/50 mL premix  Status:  Discontinued     2 g 100 mL/hr over 30 Minutes Intravenous Every 12 hours 08/18/14 1517 08/18/14 1524   08/18/14 1330  ceFAZolin (ANCEF) 2-3 GM-% IVPB SOLR    Comments:  Barley, Jenny   : cabinet override      08/18/14 1330 08/19/14 0144   08/18/14 0600  ceFAZolin (ANCEF) IVPB 1 g/50 mL premix  Status:  Discontinued     1 g 100 mL/hr over 30 Minutes Intravenous Every 12 hours 08/17/14 1822 08/18/14 1517   08/16/14 1800  ceFAZolin (ANCEF) IVPB 2 g/50 mL premix  Status:  Discontinued     2 g 100 mL/hr over 30 Minutes Intravenous Every 12 hours 08/16/14 1355 08/17/14 1822   08/15/14 1200  nafcillin 2 g in dextrose 5 % 50 mL IVPB  Status:  Discontinued     2 g 100 mL/hr over 30 Minutes Intravenous 6 times per day 08/15/14 1028 08/16/14 1253   08/14/14 1400  ceFAZolin (ANCEF) IVPB 2 g/50 mL premix  Status:  Discontinued     2 g 100 mL/hr over 30 Minutes Intravenous 3 times per day 08/14/14 0850 08/15/14 1028   08/12/14 1800  vancomycin (VANCOCIN) 500 mg in sodium chloride 0.9 % 100 mL IVPB  Status:  Discontinued     500 mg 100 mL/hr  over 60 Minutes Intravenous Every 12 hours 08/12/14 0705 08/12/14 0721   08/12/14 1800  vancomycin (VANCOCIN) IVPB 750 mg/150 ml premix  Status:  Discontinued     750 mg 150 mL/hr over 60 Minutes Intravenous Every 12 hours 08/12/14 0721 08/12/14 1315   08/10/14 2138  vancomycin (VANCOCIN) powder  Status:  Discontinued       As needed 08/10/14 2138 08/10/14 2153   08/10/14 1600  cefTRIAXone (ROCEPHIN) 2 g in dextrose 5 % 50 mL IVPB - Premix  Status:  Discontinued     2 g 100 mL/hr over 30 Minutes Intravenous Every 24 hours 08/09/14 1555 08/10/14 1432   08/10/14 1600  ceFAZolin (ANCEF) IVPB 2 g/50  mL premix  Status:  Discontinued     2 g 100 mL/hr over 30 Minutes Intravenous 3 times per day 08/10/14 1432 08/14/14 0845   08/10/14 0600  vancomycin (VANCOCIN) IVPB 750 mg/150 ml premix  Status:  Discontinued     750 mg 150 mL/hr over 60 Minutes Intravenous Every 12 hours 08/09/14 2018 08/12/14 0705   08/09/14 1800  vancomycin (VANCOCIN) 2,000 mg in sodium chloride 0.9 % 500 mL IVPB     2,000 mg 250 mL/hr over 120 Minutes Intravenous  Once 08/09/14 1616 08/09/14 1929   08/09/14 1630  cefTRIAXone (ROCEPHIN) 2 g in dextrose 5 % 50 mL IVPB - Premix     2 g 100 mL/hr over 30 Minutes Intravenous  Once 08/09/14 1553 08/09/14 1653     Assessment:  59 year old lady with multiple medical problems admitted with MSSA bacteremia with septic arthritis of the numerous joints.  She also has a newly identified Pseudomonas UTI.  She is continuing on Cefazolin for the MSSA infections.    Levaquin is discontinued and Cipro to be started given sensitivity report.  Goal of Therapy:  Cipro dosed for clinical indication and adjusted for renal function.  Plan:  1. Cipro 500 mg PO q 24 hours to be given after HD on dialysis days.  Estelle June, Pharm.D.  09/05/2014 6:26 PM

## 2014-09-05 NOTE — Progress Notes (Signed)
Patient has put out 250 ml brown milky urine since catheter inserted. Dr. Karleen Hampshire notified of urine characteristics.

## 2014-09-05 NOTE — Progress Notes (Signed)
Patient ambulated in hallway with PT using walker. Patient weak. Back to chair at this time. Resting quietly.

## 2014-09-06 DIAGNOSIS — N170 Acute kidney failure with tubular necrosis: Secondary | ICD-10-CM

## 2014-09-06 LAB — CBC
HCT: 24.6 % — ABNORMAL LOW (ref 36.0–46.0)
Hemoglobin: 7.6 g/dL — ABNORMAL LOW (ref 12.0–15.0)
MCH: 29.2 pg (ref 26.0–34.0)
MCHC: 30.9 g/dL (ref 30.0–36.0)
MCV: 94.6 fL (ref 78.0–100.0)
Platelets: 299 10*3/uL (ref 150–400)
RBC: 2.6 MIL/uL — ABNORMAL LOW (ref 3.87–5.11)
RDW: 16.2 % — ABNORMAL HIGH (ref 11.5–15.5)
WBC: 6.3 10*3/uL (ref 4.0–10.5)

## 2014-09-06 LAB — BASIC METABOLIC PANEL
Anion gap: 4 — ABNORMAL LOW (ref 5–15)
BUN: 23 mg/dL (ref 6–23)
CO2: 28 mmol/L (ref 19–32)
Calcium: 7.9 mg/dL — ABNORMAL LOW (ref 8.4–10.5)
Chloride: 95 mmol/L — ABNORMAL LOW (ref 96–112)
Creatinine, Ser: 7.5 mg/dL — ABNORMAL HIGH (ref 0.50–1.10)
GFR calc Af Amer: 6 mL/min — ABNORMAL LOW (ref >90)
GFR calc non Af Amer: 5 mL/min — ABNORMAL LOW (ref >90)
Glucose, Bld: 119 mg/dL — ABNORMAL HIGH (ref 70–99)
Potassium: 4.3 mmol/L (ref 3.5–5.1)
Sodium: 127 mmol/L — ABNORMAL LOW (ref 135–145)

## 2014-09-06 LAB — GLUCOSE, CAPILLARY
Glucose-Capillary: 137 mg/dL — ABNORMAL HIGH (ref 70–99)
Glucose-Capillary: 136 mg/dL — ABNORMAL HIGH (ref 70–99)
Glucose-Capillary: 111 mg/dL — ABNORMAL HIGH (ref 70–99)
Glucose-Capillary: 124 mg/dL — ABNORMAL HIGH (ref 70–99)

## 2014-09-06 MED ORDER — HEPARIN SODIUM (PORCINE) 5000 UNIT/ML IJ SOLN: 5000.0000 [IU] | Freq: Three times a day (TID) | INTRAMUSCULAR | Status: DC

## 2014-09-06 MED ORDER — FUROSEMIDE 10 MG/ML IJ SOLN
80.0000 mg | Freq: Three times a day (TID) | INTRAMUSCULAR | Status: DC
  Administered 2014-09-06 – 2014-09-08 (×7): 80 mg via INTRAVENOUS
  Filled 2014-09-06 (×12): qty 8

## 2014-09-06 NOTE — Progress Notes (Signed)
[facility] ROUNDING NOTE   Subjective:   Interval History: no complaints some knee pain  Objective:  Vital signs in last 24 hours:  Temp:  [98.2 F (36.8 C)-99.8 F (37.7 C)] 98.4 F (36.9 C) (03/23 0945) Pulse Rate:  [77-90] 84 (03/23 0945) Resp:  [17-20] 20 (03/23 0945) BP: (146-152)/(69-86) 152/86 mmHg (03/23 0945) SpO2:  [92 %-95 %] 95 % (03/23 0945) Weight:  [98.431 kg (217 lb)] 98.431 kg (217 lb) (03/22 2059)  Weight change: 3.538 kg (7 lb 12.8 oz) Filed Weights   09/03/14 2109 09/04/14 2127 09/05/14 2059  Weight: 95.3 kg (210 lb 1.6 oz) 94.892 kg (209 lb 3.2 oz) 98.431 kg (217 lb)    Intake/Output: I/O last 3 completed shifts: In: 59 [P.O.:720; I.V.:200; IV Piggyback:50] Out: 400 [Urine:400]   Intake/Output this shift:  Total I/O In: 240 [P.O.:240] Out: -   CVS- RRR RS- CTA ABD- BS present soft non-distended EXT- 1 + edema   Basic Metabolic Panel:  Recent Labs Lab 09/01/14 0708  09/02/14 0720 09/03/14 0455 09/04/14 1404 09/05/14 0530 09/06/14 0700  NA 133*  < > 130* 131* 131* 130* 127*  K 4.0  < > 3.5 3.9 4.1 4.3 4.3  CL 97  < > 96 96 95* 95* 95*  CO2 27  < > 25 30 29 27 28   GLUCOSE 114*  < > 115* 144* 110* 123* 119*  BUN 19  < > 11 10 15 20 23   CREATININE 5.98*  < > 4.59* 3.78* 5.65* 6.56* 7.50*  CALCIUM 8.2*  < > 7.8* 7.7* 8.2* 8.0* 7.9*  PHOS 4.4  --  3.9  --   --   --   --   < > = values in this interval not displayed.  Liver Function Tests:  Recent Labs Lab 09/01/14 0708 09/02/14 0720  ALBUMIN 1.3* 1.4*   No results for input(s): LIPASE, AMYLASE in the last 168 hours. No results for input(s): AMMONIA in the last 168 hours.  CBC:  Recent Labs Lab 08/31/14 1141 09/01/14 0708 09/02/14 0645 09/04/14 0500 09/06/14 0700  WBC 12.6* 12.4* 9.9 7.7 6.3  HGB 8.0* 8.1* 7.8* 8.1* 7.6*  HCT 25.3* 25.5* 25.2* 26.3* 24.6*  MCV 93.4 95.1 94.4 96.7 94.6  PLT 378 343 360 340 299    Cardiac Enzymes: No results for  input(s): CKTOTAL, CKMB, CKMBINDEX, TROPONINI in the last 168 hours.  BNP: Invalid input(s): POCBNP  CBG:  Recent Labs Lab 09/05/14 0724 09/05/14 1127 09/05/14 1642 09/05/14 2057 09/06/14 0756  GLUCAP 146* 175* 118* 164* 137*    Microbiology: Results for orders placed or performed during the hospital encounter of 08/07/14  Culture, blood (routine x 2)     Status: None   Collection Time: 08/09/14 12:15 PM  Result Value Ref Range Status   Specimen Description BLOOD RIGHT HAND  Final   Special Requests   Final    BOTTLES DRAWN AEROBIC AND ANAEROBIC 7CCS BOTH BOTTLES   Culture   Final    STAPHYLOCOCCUS AUREUS Note: RIFAMPIN AND GENTAMICIN SHOULD NOT BE USED AS SINGLE DRUGS FOR TREATMENT OF STAPH INFECTIONS. Note: Gram Stain Report Called to,Read Back By and Verified With: Shirlean Kelly 700AM 08/10/14 [place] Performed at [facility]    Report Status 08/12/2014 FINAL  Final   Organism ID, Bacteria STAPHYLOCOCCUS AUREUS  Final      Susceptibility   Staphylococcus aureus - MIC*    CLINDAMYCIN <=0.25 SENSITIVE Sensitive     ERYTHROMYCIN <=  0.25 SENSITIVE Sensitive     GENTAMICIN <=0.5 SENSITIVE Sensitive     LEVOFLOXACIN 0.25 SENSITIVE Sensitive     OXACILLIN <=0.25 SENSITIVE Sensitive     PENICILLIN >=0.5 RESISTANT Resistant     RIFAMPIN <=0.5 SENSITIVE Sensitive     TRIMETH/SULFA <=10 SENSITIVE Sensitive     VANCOMYCIN 1 SENSITIVE Sensitive     TETRACYCLINE <=1 SENSITIVE Sensitive     MOXIFLOXACIN <=0.25 SENSITIVE Sensitive     * STAPHYLOCOCCUS AUREUS  Culture, blood (routine x 2)     Status: None   Collection Time: 08/09/14 12:25 PM  Result Value Ref Range Status   Specimen Description BLOOD RIGHT ARM  Final   Special Requests   Final    BOTTLES DRAWN AEROBIC AND ANAEROBIC 10CC BOTH BOTTLES   Culture   Final    STAPHYLOCOCCUS AUREUS Note: SUSCEPTIBILITIES PERFORMED ON PREVIOUS CULTURE WITHIN THE LAST 5 DAYS. Note: Gram Stain Report Called to,Read Back By  and Verified With: Shirlean Kelly 700AM 08/10/14 [place] Performed at [facility]    Report Status 08/12/2014 FINAL  Final  Culture, routine-abscess     Status: None   Collection Time: 08/10/14 11:54 AM  Result Value Ref Range Status   Specimen Description SHOULDER LEFT  Final   Special Requests NONE  Final   Gram Stain   Final    ABUNDANT WBC PRESENT, PREDOMINANTLY PMN NO SQUAMOUS EPITHELIAL CELLS SEEN MODERATE GRAM POSITIVE COCCI IN PAIRS IN CLUSTERS Performed at [facility]    Culture   Final    MODERATE STAPHYLOCOCCUS AUREUS Note: RIFAMPIN AND GENTAMICIN SHOULD NOT BE USED AS SINGLE DRUGS FOR TREATMENT OF STAPH INFECTIONS. Performed at [facility]    Report Status 08/13/2014 FINAL  Final   Organism ID, Bacteria STAPHYLOCOCCUS AUREUS  Final      Susceptibility   Staphylococcus aureus - MIC*    CLINDAMYCIN <=0.25 SENSITIVE Sensitive     ERYTHROMYCIN <=0.25 SENSITIVE Sensitive     GENTAMICIN <=0.5 SENSITIVE Sensitive     LEVOFLOXACIN 0.25 SENSITIVE Sensitive     OXACILLIN 0.5 SENSITIVE Sensitive     PENICILLIN >=0.5 RESISTANT Resistant     RIFAMPIN <=0.5 SENSITIVE Sensitive     TRIMETH/SULFA <=10 SENSITIVE Sensitive     VANCOMYCIN 1 SENSITIVE Sensitive     TETRACYCLINE <=1 SENSITIVE Sensitive     MOXIFLOXACIN <=0.25 SENSITIVE Sensitive     * MODERATE STAPHYLOCOCCUS AUREUS  Anaerobic culture     Status: None   Collection Time: 08/10/14 11:54 AM  Result Value Ref Range Status   Specimen Description SHOULDER LEFT  Final   Special Requests NONE  Final   Gram Stain   Final    MODERATE WBC PRESENT, PREDOMINANTLY PMN NO SQUAMOUS EPITHELIAL CELLS SEEN MODERATE GRAM POSITIVE COCCI IN PAIRS IN CLUSTERS Performed at [facility]    Culture   Final    NO ANAEROBES ISOLATED Note: NO PROPIONIBACTERIUM ISOLATED Performed at [facility]    Report Status 08/25/2014 FINAL  Final  Body fluid culture     Status: None   Collection  Time: 08/10/14  5:22 PM  Result Value Ref Range Status   Specimen Description SYNOVIAL JOINT RIGHT KNEE  Final   Special Requests Normal  Final   Gram Stain   Final    ABUNDANT WBC PRESENT,BOTH PMN AND MONONUCLEAR NO ORGANISMS SEEN Performed at [facility]    Culture   Final    FEW STAPHYLOCOCCUS AUREUS Note: RIFAMPIN AND  GENTAMICIN SHOULD NOT BE USED AS SINGLE DRUGS FOR TREATMENT OF STAPH INFECTIONS. CRITICAL RESULT CALLED TO, READ BACK BY AND VERIFIED WITH: CINDY FLORES RN 08/13/14 AT 67 AM BY [facility] Performed at [facility]    Report Status 08/14/2014 FINAL  Final   Organism ID, Bacteria STAPHYLOCOCCUS AUREUS  Final      Susceptibility   Staphylococcus aureus - MIC*    CLINDAMYCIN <=0.25 SENSITIVE Sensitive     ERYTHROMYCIN <=0.25 SENSITIVE Sensitive     GENTAMICIN <=0.5 SENSITIVE Sensitive     LEVOFLOXACIN 0.25 SENSITIVE Sensitive     OXACILLIN 0.5 SENSITIVE Sensitive     PENICILLIN >=0.5 RESISTANT Resistant     RIFAMPIN <=0.5 SENSITIVE Sensitive     TRIMETH/SULFA <=10 SENSITIVE Sensitive     VANCOMYCIN <=0.5 SENSITIVE Sensitive     TETRACYCLINE <=1 SENSITIVE Sensitive     MOXIFLOXACIN <=0.25 SENSITIVE Sensitive     * FEW STAPHYLOCOCCUS AUREUS  Surgical pcr screen     Status: Abnormal   Collection Time: 08/10/14  6:42 PM  Result Value Ref Range Status   MRSA, PCR NEGATIVE NEGATIVE Final   Staphylococcus aureus POSITIVE (A) NEGATIVE Final    Comment:        The Xpert SA Assay (FDA approved for NASAL specimens in patients over 26 years of age), is one component of a comprehensive surveillance program.  Test performance has been validated by [facility] for patients greater than or equal to 11 year old. It is not intended to diagnose infection nor to guide or monitor treatment.   Body fluid culture     Status: None   Collection Time: 08/10/14  9:14 PM  Result Value Ref Range Status   Specimen Description FLUID LEFT AC JOINT  Final   Special  Requests VANCO, ROCEPHIN  Final   Gram Stain   Final    FEW WBC PRESENT, PREDOMINANTLY PMN MODERATE GRAM POSITIVE COCCI IN PAIRS IN CLUSTERS Gram Stain Report Called to,Read Back By and Verified With: Gram Stain Report Called to,Read Back By and Verified With: Laurance Flatten RN 08/11/13 9:10AM BY [place] Performed at [facility]    Culture   Final    ABUNDANT STAPHYLOCOCCUS AUREUS Note: RIFAMPIN AND GENTAMICIN SHOULD NOT BE USED AS SINGLE DRUGS FOR TREATMENT OF STAPH INFECTIONS. Performed at [facility]    Report Status 08/13/2014 FINAL  Final   Organism ID, Bacteria STAPHYLOCOCCUS AUREUS  Final      Susceptibility   Staphylococcus aureus - MIC*    CLINDAMYCIN <=0.25 SENSITIVE Sensitive     ERYTHROMYCIN <=0.25 SENSITIVE Sensitive     GENTAMICIN <=0.5 SENSITIVE Sensitive     LEVOFLOXACIN 0.25 SENSITIVE Sensitive     OXACILLIN <=0.25 SENSITIVE Sensitive     PENICILLIN >=0.5 RESISTANT Resistant     RIFAMPIN <=0.5 SENSITIVE Sensitive     TRIMETH/SULFA <=10 SENSITIVE Sensitive     VANCOMYCIN 1 SENSITIVE Sensitive     TETRACYCLINE <=1 SENSITIVE Sensitive     MOXIFLOXACIN <=0.25 SENSITIVE Sensitive     * ABUNDANT STAPHYLOCOCCUS AUREUS  Anaerobic culture     Status: None   Collection Time: 08/10/14  9:14 PM  Result Value Ref Range Status   Specimen Description FLUID LEFT AC JOINT  Final   Special Requests VANCO, ROCEPHIN  Final   Gram Stain   Final    FEW WBC PRESENT, PREDOMINANTLY PMN MODERATE GRAM POSITIVE COCCI IN PAIRS IN CLUSTERS Performed at [employer] [facility]  Final    NO ANAEROBES ISOLATED Performed at [facility]    Report Status 08/15/2014 FINAL  Final  Culture, blood (routine x 2)     Status: None   Collection Time: 08/11/14  7:00 AM  Result Value Ref Range Status   Specimen Description BLOOD RIGHT ARM  Final   Special Requests BOTTLES DRAWN AEROBIC AND ANAEROBIC 6CC  Final   Culture   Final    NO GROWTH 5 DAYS Performed at  [facility]    Report Status 08/17/2014 FINAL  Final  Culture, blood (routine x 2)     Status: None   Collection Time: 08/11/14  7:08 AM  Result Value Ref Range Status   Specimen Description BLOOD LEFT HAND  Final   Special Requests BOTTLES DRAWN AEROBIC AND ANAEROBIC [place]  Final   Culture   Final    NO GROWTH 5 DAYS Performed at [facility]    Report Status 08/17/2014 FINAL  Final  Culture, Urine     Status: None   Collection Time: 08/12/14  5:47 AM  Result Value Ref Range Status   Specimen Description URINE, RANDOM  Final   Special Requests NONE  Final   Colony Count   Final    3,000 COLONIES/ML Performed at [facility]    Culture   Final    INSIGNIFICANT GROWTH Performed at [facility]    Report Status 08/13/2014 FINAL  Final  Culture, blood (routine x 2)     Status: None   Collection Time: 08/15/14  1:15 PM  Result Value Ref Range Status   Specimen Description BLOOD RIGHT HAND  Final   Special Requests BOTTLES DRAWN AEROBIC ONLY 3CC  Final   Culture   Final    NO GROWTH 5 DAYS Performed at [facility]    Report Status 08/21/2014 FINAL  Final  Culture, blood (routine x 2)     Status: None   Collection Time: 08/15/14  3:11 PM  Result Value Ref Range Status   Specimen Description BLOOD RIGHT HAND  Final   Special Requests   Final    BOTTLES DRAWN AEROBIC AND ANAEROBIC BLUE 10CC RED 5CC   Culture   Final    NO GROWTH 5 DAYS Performed at [facility]    Report Status 08/21/2014 FINAL  Final  Culture, Urine     Status: None   Collection Time: 08/17/14  3:14 PM  Result Value Ref Range Status   Specimen Description URINE, CATHETERIZED  Final   Special Requests NONE  Final   Colony Count NO GROWTH Performed at [facility]   Final   Culture NO GROWTH Performed at [facility]   Final   Report Status 08/18/2014 FINAL  Final  Body fluid culture     Status: None   Collection Time: 08/18/14  4:52  PM  Result Value Ref Range Status   Specimen Description FLUID SYNOVIAL LEFT KNEE  Final   Special Requests Normal  Final   Gram Stain   Final    MODERATE WBC PRESENT,BOTH PMN AND MONONUCLEAR NO ORGANISMS SEEN Performed at [facility]    Culture   Final    NO GROWTH 3 DAYS Performed at [facility]    Report Status 08/22/2014 FINAL  Final  Culture, Urine     Status: None   Collection Time: 09/02/14  3:04 PM  Result Value Ref Range Status   Specimen Description URINE,  CATHETERIZED  Final   Special Requests NONE  Final   Colony Count   Final    >=100,000 COLONIES/ML Performed at [facility]    Culture   Final    PSEUDOMONAS AERUGINOSA Performed at [facility]    Report Status 09/05/2014 FINAL  Final   Organism ID, Bacteria PSEUDOMONAS AERUGINOSA  Final      Susceptibility   Pseudomonas aeruginosa - MIC*    CEFEPIME 2 SENSITIVE Sensitive     CEFTAZIDIME 4 SENSITIVE Sensitive     CIPROFLOXACIN 0.5 SENSITIVE Sensitive     GENTAMICIN <=1 SENSITIVE Sensitive     IMIPENEM >=16 RESISTANT Resistant     PIP/TAZO 8 SENSITIVE Sensitive     TOBRAMYCIN <=1 SENSITIVE Sensitive     * PSEUDOMONAS AERUGINOSA    Coagulation Studies: No results for input(s): LABPROT, INR in the last 72 hours.  Urinalysis: No results for input(s): COLORURINE, LABSPEC, PHURINE, GLUCOSEU, HGBUR, BILIRUBINUR, KETONESUR, PROTEINUR, UROBILINOGEN, NITRITE, LEUKOCYTESUR in the last 72 hours.  Invalid input(s): APPERANCEUR    Imaging: No results found.   Medications:   . sodium chloride 10 mL/hr at 09/05/14 1857   . antiseptic oral rinse  7 mL Mouth Rinse q12n4p  . aspirin  81 mg Oral Daily  .  ceFAZolin (ANCEF) IV  1 g Intravenous Q24H  . chlorhexidine  15 mL Mouth Rinse BID  . ciprofloxacin  500 mg Oral Q24H  . cyclobenzaprine  5 mg Oral BID  . darbepoetin (ARANESP) injection - DIALYSIS  100 mcg Intravenous Q Tue-HD  . famotidine  20 mg Oral Daily  . furosemide   80 mg Intravenous 3 times per day  . heparin subcutaneous  5,000 Units Subcutaneous 3 times per day  . hydrALAZINE  25 mg Oral TID  . insulin aspart  0-9 Units Subcutaneous TID WC  . lanthanum  1,000 mg Oral TID WC  . metoprolol tartrate  75 mg Oral BID  . nicotine  21 mg Transdermal Daily  . oxybutynin  5 mg Oral BID  . polyethylene glycol  17 g Oral BID  . sodium chloride  10-40 mL Intracatheter Q12H  . sodium chloride  10-40 mL Intracatheter Q12H  . sodium chloride  3 mL Intravenous Q12H   sodium chloride, sodium chloride, acetaminophen, diphenhydrAMINE, feeding supplement (NEPRO CARB STEADY), gi cocktail, guaiFENesin-dextromethorphan, heparin, heparin, hydrALAZINE, HYDROcodone-acetaminophen, HYDROmorphone (DILAUDID) injection, lidocaine (PF), lidocaine-prilocaine, magic mouthwash w/lidocaine, ondansetron (ZOFRAN) IV, pentafluoroprop-tetrafluoroeth, sodium chloride, sodium chloride, sorbitol, zolpidem  Assessment/ Plan:  year old lady with multiple medical problems initially admitted for left shoulder pain, further work up revealed MSSA bacteremia with septic arthritis of the numerous joints. She underwent arthroscopic irrigation and debridement of the septic arthritis. TEE is negative for endocarditis. She was started on ancef by ID and recommended 8 weeks of treatment. 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

## 2014-09-06 NOTE — Progress Notes (Addendum)
[facility] PROGRESS NOTE  Brandy Houston [id number] DOB: 25-Mar-1956 DOA: 08/07/2014 PCP: Chari Manning, NP Interim summary: 59 year old lady with multiple medical problems initially admitted for left shoulder pain, further work up revealed MSSA bacteremia with septic arthritis of the numerous joints.  She underwent arthroscopic irrigation and debridement of the septic arthritis.  TEE is negative for endocarditis.  She was started on ancef by ID and recommended 8 weeks of treatment. Meanwhile her renal function worsened and renal consulted.  She was started on HD sessions.   Assessment/Plan: 1.  MSSA BACTEREMIA with septic arthritis: -Bilateral knee and shoulder septic arthritis seen by ID and Ortho Dr [name], she underwent irrigation and debridement of Right knee, left knee, Right AC & left AC joints, and Irrigation and debridement of right hand flexor tendon sheath. -Currently on cefazolin to continue a total of 8 weeks of treatment, 2/26 as day 1 of 56 days -Underwent TTE on 2/24 & TEE on 2/29 that was negative -She again underwent R knee arthroscopic irrigation and debridement on 3/5 by Dr [name].  -Pain control.  -ID and orthopedics are available as needed.   2. UTI -She had episode of fever on 3/19, possibly from UTI, urine cultures showed pseudomonas sensitive to ciprofloxacin -continue ciprofloxacin day 2  3. 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  -started on Temporary HD via R tunned IJ dialysis catheter, last HD 3/19 -urine output poor 411ml -Renal following  -no indications for HD today  4. Diabetes mellitus; -stable -SSI  5. Hepatitis C antibody positive: -FU with ID as outpatient.   6. Anemia; Anemia due to chronic disease and blood loss, acute illness -epo as per renal. She received a dose of ferra heme  -Transfuse to keep hemoglobin greater than 7. Last hemoglobin is 8.1  -CBC  in am  7. Severe malnutrition -Severe hypoalbuminemia, Nutrition supplementation.   8. Hypertension:  well controlled. Resume the same dose of anti hypertensives.    9.Urinary retention: -stop oxybutynin, continue foley  DVT proph: Hep Sq  Code Status: full code.  Family Communication: none at bedside Disposition Plan: pending, plan for SNF when her renal function improves.    Consultants:  Renal  ID  Orthopedics.     Procedures:  HD   Arthroscopic irrigation and debridement of septic arthritis of left knee on 3/5   Arthroscopic extensive synovectomy in all 3 compartments of the left knee ON 3/5  Renal biopsy done on march 4 th 2016 and was found to have C3  Acute diffuse proliferative glomerulonephritis.   Antibiotics:  Ancef and levaquin  HPI/Subjective: Doing better, no new complaints, eating ok  Objective: Filed Vitals:   09/06/14 0945  BP: 152/86  Pulse: 84  Temp: 98.4 F (36.9 C)  Resp: 20    Intake/Output Summary (Last 24 hours) at 09/06/14 1449 Last data filed at 09/06/14 0941  Gross per 24 hour  Intake    640 ml  Output    400 ml  Net    240 ml   Filed Weights   09/03/14 2109 09/04/14 2127 09/05/14 2059  Weight: 95.3 kg (210 lb 1.6 oz) 94.892 kg (209 lb 3.2 oz) 98.431 kg (217 lb)    Exam:   General:  Alert afebrile comfortable, chronically ill appearing, neck with central line  Cardiovascular: s1s2, RRR   Respiratory: clear to auscultation, no wheezing or rhonchi  Abdomen: soft non tender non  distended bowel sounds heard  Musculoskeletal: leg edema 2plus, L knee with swelling and insicion  Data Reviewed: Basic Metabolic Panel:  Recent Labs Lab 09/01/14 0708  09/02/14 0720 09/03/14 0455 09/04/14 1404 09/05/14 0530 09/06/14 0700  NA 133*  < > 130* 131* 131* 130* 127*  K 4.0  < > 3.5 3.9 4.1 4.3 4.3  CL 97  < > 96 96 95* 95* 95*  CO2 27  < > 25 30 29 27 28   GLUCOSE 114*  < > 115* 144* 110* 123* 119*  BUN 19  < > 11  10 15 20 23   CREATININE 5.98*  < > 4.59* 3.78* 5.65* 6.56* 7.50*  CALCIUM 8.2*  < > 7.8* 7.7* 8.2* 8.0* 7.9*  PHOS 4.4  --  3.9  --   --   --   --   < > = values in this interval not displayed. Liver Function Tests:  Recent Labs Lab 09/01/14 0708 09/02/14 0720  ALBUMIN 1.3* 1.4*   No results for input(s): LIPASE, AMYLASE in the last 168 hours. No results for input(s): AMMONIA in the last 168 hours. CBC:  Recent Labs Lab 08/31/14 1141 09/01/14 0708 09/02/14 0645 09/04/14 0500 09/06/14 0700  WBC 12.6* 12.4* 9.9 7.7 6.3  HGB 8.0* 8.1* 7.8* 8.1* 7.6*  HCT 25.3* 25.5* 25.2* 26.3* 24.6*  MCV 93.4 95.1 94.4 96.7 94.6  PLT 378 343 360 340 299   Cardiac Enzymes: No results for input(s): CKTOTAL, CKMB, CKMBINDEX, TROPONINI in the last 168 hours. BNP (last 3 results) No results for input(s): BNP in the last 8760 hours.  ProBNP (last 3 results) No results for input(s): PROBNP in the last 8760 hours.  CBG:  Recent Labs Lab 09/05/14 1127 09/05/14 1642 09/05/14 2057 09/06/14 0756 09/06/14 1134  GLUCAP 175* 118* 164* 137* 136*    Recent Results (from the past 240 hour(s))  Culture, Urine     Status: None   Collection Time: 09/02/14  3:04 PM  Result Value Ref Range Status   Specimen Description URINE, CATHETERIZED  Final   Special Requests NONE  Final   Colony Count   Final    >=100,000 COLONIES/ML Performed at [facility]    Culture   Final    PSEUDOMONAS AERUGINOSA Performed at [facility]    Report Status 09/05/2014 FINAL  Final   Organism ID, Bacteria PSEUDOMONAS AERUGINOSA  Final      Susceptibility   Pseudomonas aeruginosa - MIC*    CEFEPIME 2 SENSITIVE Sensitive     CEFTAZIDIME 4 SENSITIVE Sensitive     CIPROFLOXACIN 0.5 SENSITIVE Sensitive     GENTAMICIN <=1 SENSITIVE Sensitive     IMIPENEM >=16 RESISTANT Resistant     PIP/TAZO 8 SENSITIVE Sensitive     TOBRAMYCIN <=1 SENSITIVE Sensitive     * PSEUDOMONAS AERUGINOSA      Studies: No results found.  Scheduled Meds: . antiseptic oral rinse  7 mL Mouth Rinse q12n4p  . aspirin  81 mg Oral Daily  .  ceFAZolin (ANCEF) IV  1 g Intravenous Q24H  . chlorhexidine  15 mL Mouth Rinse BID  . ciprofloxacin  500 mg Oral Q24H  . cyclobenzaprine  5 mg Oral BID  . darbepoetin (ARANESP) injection - DIALYSIS  100 mcg Intravenous Q Tue-HD  . famotidine  20 mg Oral Daily  . furosemide  80 mg Intravenous 3 times per day  . heparin subcutaneous  5,000 Units Subcutaneous 3 times per day  . hydrALAZINE  25 mg Oral TID  . insulin aspart  0-9 Units Subcutaneous TID WC  . lanthanum  1,000 mg Oral TID WC  . metoprolol tartrate  75 mg Oral BID  . nicotine  21 mg Transdermal Daily  . oxybutynin  5 mg Oral BID  . polyethylene glycol  17 g Oral BID  . sodium chloride  10-40 mL Intracatheter Q12H  . sodium chloride  10-40 mL Intracatheter Q12H  . sodium chloride  3 mL Intravenous Q12H   Continuous Infusions: . sodium chloride 10 mL/hr at 09/05/14 1857    Principal Problem:   Staphylococcus aureus bacteremia Active Problems:   Septic joint of left shoulder region   Sinus tachycardia   Septic joint of right knee joint   CKD (chronic kidney disease) stage 3, GFR 30-59 ml/min   Septic joint of right hand   SIRS (systemic inflammatory response syndrome)   Benign essential HTN   Hepatitis C   Acute renal failure   Acute renal failure syndrome   Hep C w/ coma, chronic   Joint infection    Time spent: 25 minutes.     Domenic Polite  [facility] Pager [phone] If 7PM-7AM, please contact night-coverage at [email or web address], password [facility] 09/06/2014, 2:49 PM  LOS: 30 days

## 2014-09-07 LAB — CBC
HCT: 24.6 % — ABNORMAL LOW (ref 36.0–46.0)
Hemoglobin: 7.7 g/dL — ABNORMAL LOW (ref 12.0–15.0)
MCH: 29.7 pg (ref 26.0–34.0)
MCHC: 31.3 g/dL (ref 30.0–36.0)
MCV: 95 fL (ref 78.0–100.0)
Platelets: 304 10*3/uL (ref 150–400)
RBC: 2.59 MIL/uL — ABNORMAL LOW (ref 3.87–5.11)
RDW: 15.7 % — ABNORMAL HIGH (ref 11.5–15.5)
WBC: 6.9 10*3/uL (ref 4.0–10.5)

## 2014-09-07 LAB — GLUCOSE, CAPILLARY
Glucose-Capillary: 105 mg/dL — ABNORMAL HIGH (ref 70–99)
Glucose-Capillary: 125 mg/dL — ABNORMAL HIGH (ref 70–99)
Glucose-Capillary: 117 mg/dL — ABNORMAL HIGH (ref 70–99)
Glucose-Capillary: 126 mg/dL — ABNORMAL HIGH (ref 70–99)

## 2014-09-07 LAB — RENAL FUNCTION PANEL
Albumin: 1.4 g/dL — ABNORMAL LOW (ref 3.5–5.2)
Anion gap: 10 (ref 5–15)
BUN: 28 mg/dL — ABNORMAL HIGH (ref 6–23)
CO2: 25 mmol/L (ref 19–32)
Calcium: 8.2 mg/dL — ABNORMAL LOW (ref 8.4–10.5)
Chloride: 94 mmol/L — ABNORMAL LOW (ref 96–112)
Creatinine, Ser: 8.17 mg/dL — ABNORMAL HIGH (ref 0.50–1.10)
GFR calc Af Amer: 6 mL/min — ABNORMAL LOW (ref >90)
GFR calc non Af Amer: 5 mL/min — ABNORMAL LOW (ref >90)
Glucose, Bld: 135 mg/dL — ABNORMAL HIGH (ref 70–99)
Phosphorus: 4.8 mg/dL — ABNORMAL HIGH (ref 2.3–4.6)
Potassium: 4.6 mmol/L (ref 3.5–5.1)
Sodium: 129 mmol/L — ABNORMAL LOW (ref 135–145)

## 2014-09-07 NOTE — Progress Notes (Signed)
[facility] ROUNDING NOTE   Subjective:   Interval History: no complaints today appears better   Objective:  Vital signs in last 24 hours:  Temp:  [98.5 F (36.9 C)-99.2 F (37.3 C)] 98.5 F (36.9 C) (03/24 0815) Pulse Rate:  [77-119] 77 (03/24 0815) Resp:  [18-19] 18 (03/24 0815) BP: (122-169)/(70-82) 140/70 mmHg (03/24 0815) SpO2:  [93 %-97 %] 97 % (03/24 0815) Weight:  [99.2 kg (218 lb 11.1 oz)] 99.2 kg (218 lb 11.1 oz) (03/23 2100)  Weight change: 0.769 kg (1 lb 11.1 oz) Filed Weights   09/04/14 2127 09/05/14 2059 09/06/14 2100  Weight: 94.892 kg (209 lb 3.2 oz) 98.431 kg (217 lb) 99.2 kg (218 lb 11.1 oz)    Intake/Output: I/O last 3 completed shifts: In: 60 [P.O.:840; I.V.:40] Out: 100 [Urine:100]   Intake/Output this shift:     CVS- RRR RS- CTA ABD- BS present soft non-distended EXT- 1 + edema   Basic Metabolic Panel:  Recent Labs Lab 09/01/14 0708  09/02/14 0720 09/03/14 0455 09/04/14 1404 09/05/14 0530 09/06/14 0700  NA 133*  < > 130* 131* 131* 130* 127*  K 4.0  < > 3.5 3.9 4.1 4.3 4.3  CL 97  < > 96 96 95* 95* 95*  CO2 27  < > 25 30 29 27 28   GLUCOSE 114*  < > 115* 144* 110* 123* 119*  BUN 19  < > 11 10 15 20 23   CREATININE 5.98*  < > 4.59* 3.78* 5.65* 6.56* 7.50*  CALCIUM 8.2*  < > 7.8* 7.7* 8.2* 8.0* 7.9*  PHOS 4.4  --  3.9  --   --   --   --   < > = values in this interval not displayed.  Liver Function Tests:  Recent Labs Lab 09/01/14 0708 09/02/14 0720  ALBUMIN 1.3* 1.4*   No results for input(s): LIPASE, AMYLASE in the last 168 hours. No results for input(s): AMMONIA in the last 168 hours.  CBC:  Recent Labs Lab 08/31/14 1141 09/01/14 0708 09/02/14 0645 09/04/14 0500 09/06/14 0700  WBC 12.6* 12.4* 9.9 7.7 6.3  HGB 8.0* 8.1* 7.8* 8.1* 7.6*  HCT 25.3* 25.5* 25.2* 26.3* 24.6*  MCV 93.4 95.1 94.4 96.7 94.6  PLT 378 343 360 340 299    Cardiac Enzymes: No results for input(s): CKTOTAL, CKMB, CKMBINDEX,  TROPONINI in the last 168 hours.  BNP: Invalid input(s): POCBNP  CBG:  Recent Labs Lab 09/06/14 0756 09/06/14 1134 09/06/14 1638 09/06/14 2137 09/07/14 0808  GLUCAP 137* 136* 111* 124* 105*    Microbiology: Results for orders placed or performed during the hospital encounter of 08/07/14  Culture, blood (routine x 2)     Status: None   Collection Time: 08/09/14 12:15 PM  Result Value Ref Range Status   Specimen Description BLOOD RIGHT HAND  Final   Special Requests   Final    BOTTLES DRAWN AEROBIC AND ANAEROBIC 7CCS BOTH BOTTLES   Culture   Final    STAPHYLOCOCCUS AUREUS Note: RIFAMPIN AND GENTAMICIN SHOULD NOT BE USED AS SINGLE DRUGS FOR TREATMENT OF STAPH INFECTIONS. Note: Gram Stain Report Called to,Read Back By and Verified With: Shirlean Kelly 700AM 08/10/14 [place] Performed at [facility]    Report Status 08/12/2014 FINAL  Final   Organism ID, Bacteria STAPHYLOCOCCUS AUREUS  Final      Susceptibility   Staphylococcus aureus - MIC*    CLINDAMYCIN <=0.25 SENSITIVE Sensitive     ERYTHROMYCIN <=0.25 SENSITIVE Sensitive  GENTAMICIN <=0.5 SENSITIVE Sensitive     LEVOFLOXACIN 0.25 SENSITIVE Sensitive     OXACILLIN <=0.25 SENSITIVE Sensitive     PENICILLIN >=0.5 RESISTANT Resistant     RIFAMPIN <=0.5 SENSITIVE Sensitive     TRIMETH/SULFA <=10 SENSITIVE Sensitive     VANCOMYCIN 1 SENSITIVE Sensitive     TETRACYCLINE <=1 SENSITIVE Sensitive     MOXIFLOXACIN <=0.25 SENSITIVE Sensitive     * STAPHYLOCOCCUS AUREUS  Culture, blood (routine x 2)     Status: None   Collection Time: 08/09/14 12:25 PM  Result Value Ref Range Status   Specimen Description BLOOD RIGHT ARM  Final   Special Requests   Final    BOTTLES DRAWN AEROBIC AND ANAEROBIC 10CC BOTH BOTTLES   Culture   Final    STAPHYLOCOCCUS AUREUS Note: SUSCEPTIBILITIES PERFORMED ON PREVIOUS CULTURE WITHIN THE LAST 5 DAYS. Note: Gram Stain Report Called to,Read Back By and Verified With: Shirlean Kelly  700AM 08/10/14 [place] [employer] Performed at [facility]    Report Status 08/12/2014 FINAL  Final  Culture, routine-abscess     Status: None   Collection Time: 08/10/14 11:54 AM  Result Value Ref Range Status   Specimen Description SHOULDER LEFT  Final   Special Requests NONE  Final   Gram Stain   Final    ABUNDANT WBC PRESENT, PREDOMINANTLY PMN NO SQUAMOUS EPITHELIAL CELLS SEEN MODERATE GRAM POSITIVE COCCI IN PAIRS IN CLUSTERS Performed at [facility]    Culture   Final    MODERATE STAPHYLOCOCCUS AUREUS Note: RIFAMPIN AND GENTAMICIN SHOULD NOT BE USED AS SINGLE DRUGS FOR TREATMENT OF STAPH INFECTIONS. Performed at [facility]    Report Status 08/13/2014 FINAL  Final   Organism ID, Bacteria STAPHYLOCOCCUS AUREUS  Final      Susceptibility   Staphylococcus aureus - MIC*    CLINDAMYCIN <=0.25 SENSITIVE Sensitive     ERYTHROMYCIN <=0.25 SENSITIVE Sensitive     GENTAMICIN <=0.5 SENSITIVE Sensitive     LEVOFLOXACIN 0.25 SENSITIVE Sensitive     OXACILLIN 0.5 SENSITIVE Sensitive     PENICILLIN >=0.5 RESISTANT Resistant     RIFAMPIN <=0.5 SENSITIVE Sensitive     TRIMETH/SULFA <=10 SENSITIVE Sensitive     VANCOMYCIN 1 SENSITIVE Sensitive     TETRACYCLINE <=1 SENSITIVE Sensitive     MOXIFLOXACIN <=0.25 SENSITIVE Sensitive     * MODERATE STAPHYLOCOCCUS AUREUS  Anaerobic culture     Status: None   Collection Time: 08/10/14 11:54 AM  Result Value Ref Range Status   Specimen Description SHOULDER LEFT  Final   Special Requests NONE  Final   Gram Stain   Final    MODERATE WBC PRESENT, PREDOMINANTLY PMN NO SQUAMOUS EPITHELIAL CELLS SEEN MODERATE GRAM POSITIVE COCCI IN PAIRS IN CLUSTERS Performed at [facility]    Culture   Final    NO ANAEROBES ISOLATED Note: NO PROPIONIBACTERIUM ISOLATED Performed at [facility]    Report Status 08/25/2014 FINAL  Final  Body fluid culture     Status: None   Collection Time: 08/10/14  5:22 PM  Result  Value Ref Range Status   Specimen Description SYNOVIAL JOINT RIGHT KNEE  Final   Special Requests Normal  Final   Gram Stain   Final    ABUNDANT WBC PRESENT,BOTH PMN AND MONONUCLEAR NO ORGANISMS SEEN Performed at [facility]    Culture   Final    FEW STAPHYLOCOCCUS AUREUS Note: RIFAMPIN AND GENTAMICIN SHOULD NOT BE USED AS SINGLE  DRUGS FOR TREATMENT OF STAPH INFECTIONS. CRITICAL RESULT CALLED TO, READ BACK BY AND VERIFIED WITH: CINDY FLORES RN 08/13/14 AT 17 AM BY [facility] Performed at [facility]    Report Status 08/14/2014 FINAL  Final   Organism ID, Bacteria STAPHYLOCOCCUS AUREUS  Final      Susceptibility   Staphylococcus aureus - MIC*    CLINDAMYCIN <=0.25 SENSITIVE Sensitive     ERYTHROMYCIN <=0.25 SENSITIVE Sensitive     GENTAMICIN <=0.5 SENSITIVE Sensitive     LEVOFLOXACIN 0.25 SENSITIVE Sensitive     OXACILLIN 0.5 SENSITIVE Sensitive     PENICILLIN >=0.5 RESISTANT Resistant     RIFAMPIN <=0.5 SENSITIVE Sensitive     TRIMETH/SULFA <=10 SENSITIVE Sensitive     VANCOMYCIN <=0.5 SENSITIVE Sensitive     TETRACYCLINE <=1 SENSITIVE Sensitive     MOXIFLOXACIN <=0.25 SENSITIVE Sensitive     * FEW STAPHYLOCOCCUS AUREUS  Surgical pcr screen     Status: Abnormal   Collection Time: 08/10/14  6:42 PM  Result Value Ref Range Status   MRSA, PCR NEGATIVE NEGATIVE Final   Staphylococcus aureus POSITIVE (A) NEGATIVE Final    Comment:        The Xpert SA Assay (FDA approved for NASAL specimens in patients over 73 years of age), is one component of a comprehensive surveillance program.  Test performance has been validated by [facility] for patients greater than or equal to 3 year old. It is not intended to diagnose infection nor to guide or monitor treatment.   Body fluid culture     Status: None   Collection Time: 08/10/14  9:14 PM  Result Value Ref Range Status   Specimen Description FLUID LEFT AC JOINT  Final   Special Requests VANCO, ROCEPHIN  Final    Gram Stain   Final    FEW WBC PRESENT, PREDOMINANTLY PMN MODERATE GRAM POSITIVE COCCI IN PAIRS IN CLUSTERS Gram Stain Report Called to,Read Back By and Verified With: Gram Stain Report Called to,Read Back By and Verified With: Laurance Flatten RN 08/11/13 9:10AM BY [place] Performed at [facility]    Culture   Final    ABUNDANT STAPHYLOCOCCUS AUREUS Note: RIFAMPIN AND GENTAMICIN SHOULD NOT BE USED AS SINGLE DRUGS FOR TREATMENT OF STAPH INFECTIONS. Performed at [facility]    Report Status 08/13/2014 FINAL  Final   Organism ID, Bacteria STAPHYLOCOCCUS AUREUS  Final      Susceptibility   Staphylococcus aureus - MIC*    CLINDAMYCIN <=0.25 SENSITIVE Sensitive     ERYTHROMYCIN <=0.25 SENSITIVE Sensitive     GENTAMICIN <=0.5 SENSITIVE Sensitive     LEVOFLOXACIN 0.25 SENSITIVE Sensitive     OXACILLIN <=0.25 SENSITIVE Sensitive     PENICILLIN >=0.5 RESISTANT Resistant     RIFAMPIN <=0.5 SENSITIVE Sensitive     TRIMETH/SULFA <=10 SENSITIVE Sensitive     VANCOMYCIN 1 SENSITIVE Sensitive     TETRACYCLINE <=1 SENSITIVE Sensitive     MOXIFLOXACIN <=0.25 SENSITIVE Sensitive     * ABUNDANT STAPHYLOCOCCUS AUREUS  Anaerobic culture     Status: None   Collection Time: 08/10/14  9:14 PM  Result Value Ref Range Status   Specimen Description FLUID LEFT AC JOINT  Final   Special Requests VANCO, ROCEPHIN  Final   Gram Stain   Final    FEW WBC PRESENT, PREDOMINANTLY PMN MODERATE GRAM POSITIVE COCCI IN PAIRS IN CLUSTERS Performed at [facility]    Culture   Final    NO ANAEROBES  ISOLATED Performed at [facility]    Report Status 08/15/2014 FINAL  Final  Culture, blood (routine x 2)     Status: None   Collection Time: 08/11/14  7:00 AM  Result Value Ref Range Status   Specimen Description BLOOD RIGHT ARM  Final   Special Requests BOTTLES DRAWN AEROBIC AND ANAEROBIC 6CC  Final   Culture   Final    NO GROWTH 5 DAYS Performed at [facility]    Report  Status 08/17/2014 FINAL  Final  Culture, blood (routine x 2)     Status: None   Collection Time: 08/11/14  7:08 AM  Result Value Ref Range Status   Specimen Description BLOOD LEFT HAND  Final   Special Requests BOTTLES DRAWN AEROBIC AND ANAEROBIC [place]  Final   Culture   Final    NO GROWTH 5 DAYS Performed at [facility]    Report Status 08/17/2014 FINAL  Final  Culture, Urine     Status: None   Collection Time: 08/12/14  5:47 AM  Result Value Ref Range Status   Specimen Description URINE, RANDOM  Final   Special Requests NONE  Final   Colony Count   Final    3,000 COLONIES/ML Performed at [facility]    Culture   Final    INSIGNIFICANT GROWTH Performed at [facility]    Report Status 08/13/2014 FINAL  Final  Culture, blood (routine x 2)     Status: None   Collection Time: 08/15/14  1:15 PM  Result Value Ref Range Status   Specimen Description BLOOD RIGHT HAND  Final   Special Requests BOTTLES DRAWN AEROBIC ONLY 3CC  Final   Culture   Final    NO GROWTH 5 DAYS Performed at [facility]    Report Status 08/21/2014 FINAL  Final  Culture, blood (routine x 2)     Status: None   Collection Time: 08/15/14  3:11 PM  Result Value Ref Range Status   Specimen Description BLOOD RIGHT HAND  Final   Special Requests   Final    BOTTLES DRAWN AEROBIC AND ANAEROBIC BLUE 10CC RED 5CC   Culture   Final    NO GROWTH 5 DAYS Performed at [facility]    Report Status 08/21/2014 FINAL  Final  Culture, Urine     Status: None   Collection Time: 08/17/14  3:14 PM  Result Value Ref Range Status   Specimen Description URINE, CATHETERIZED  Final   Special Requests NONE  Final   Colony Count NO GROWTH Performed at [facility]   Final   Culture NO GROWTH Performed at [facility]   Final   Report Status 08/18/2014 FINAL  Final  Body fluid culture     Status: None   Collection Time: 08/18/14  4:52 PM  Result Value Ref Range Status    Specimen Description FLUID SYNOVIAL LEFT KNEE  Final   Special Requests Normal  Final   Gram Stain   Final    MODERATE WBC PRESENT,BOTH PMN AND MONONUCLEAR NO ORGANISMS SEEN Performed at [facility]    Culture   Final    NO GROWTH 3 DAYS Performed at [facility]    Report Status 08/22/2014 FINAL  Final  Culture, Urine     Status: None   Collection Time: 09/02/14  3:04 PM  Result Value Ref Range Status   Specimen Description URINE, CATHETERIZED  Final   Special  Requests NONE  Final   Colony Count   Final    >=100,000 COLONIES/ML Performed at [facility]    Culture   Final    PSEUDOMONAS AERUGINOSA Performed at [facility]    Report Status 09/05/2014 FINAL  Final   Organism ID, Bacteria PSEUDOMONAS AERUGINOSA  Final      Susceptibility   Pseudomonas aeruginosa - MIC*    CEFEPIME 2 SENSITIVE Sensitive     CEFTAZIDIME 4 SENSITIVE Sensitive     CIPROFLOXACIN 0.5 SENSITIVE Sensitive     GENTAMICIN <=1 SENSITIVE Sensitive     IMIPENEM >=16 RESISTANT Resistant     PIP/TAZO 8 SENSITIVE Sensitive     TOBRAMYCIN <=1 SENSITIVE Sensitive     * PSEUDOMONAS AERUGINOSA    Coagulation Studies: No results for input(s): LABPROT, INR in the last 72 hours.  Urinalysis: No results for input(s): COLORURINE, LABSPEC, PHURINE, GLUCOSEU, HGBUR, BILIRUBINUR, KETONESUR, PROTEINUR, UROBILINOGEN, NITRITE, LEUKOCYTESUR in the last 72 hours.  Invalid input(s): APPERANCEUR    Imaging: No results found.   Medications:   . sodium chloride 10 mL/hr at 09/05/14 1857   . antiseptic oral rinse  7 mL Mouth Rinse q12n4p  . aspirin  81 mg Oral Daily  .  ceFAZolin (ANCEF) IV  1 g Intravenous Q24H  . chlorhexidine  15 mL Mouth Rinse BID  . ciprofloxacin  500 mg Oral Q24H  . cyclobenzaprine  5 mg Oral BID  . darbepoetin (ARANESP) injection - DIALYSIS  100 mcg Intravenous Q Tue-HD  . famotidine  20 mg Oral Daily  . furosemide  80 mg Intravenous 3 times per day   . heparin subcutaneous  5,000 Units Subcutaneous 3 times per day  . hydrALAZINE  25 mg Oral TID  . insulin aspart  0-9 Units Subcutaneous TID WC  . lanthanum  1,000 mg Oral TID WC  . metoprolol tartrate  75 mg Oral BID  . nicotine  21 mg Transdermal Daily  . polyethylene glycol  17 g Oral BID  . sodium chloride  10-40 mL Intracatheter Q12H  . sodium chloride  10-40 mL Intracatheter Q12H  . sodium chloride  3 mL Intravenous Q12H   sodium chloride, sodium chloride, acetaminophen, diphenhydrAMINE, feeding supplement (NEPRO CARB STEADY), gi cocktail, guaiFENesin-dextromethorphan, heparin, heparin, hydrALAZINE, HYDROcodone-acetaminophen, HYDROmorphone (DILAUDID) injection, lidocaine (PF), lidocaine-prilocaine, magic mouthwash w/lidocaine, ondansetron (ZOFRAN) IV, pentafluoroprop-tetrafluoroeth, sodium chloride, sodium chloride, sorbitol, zolpidem  Assessment/ Plan:  year old lady with multiple medical problems initially admitted for left shoulder pain, further work up revealed MSSA bacteremia with septic arthritis of the numerous joints. She underwent arthroscopic irrigation and debridement of the septic arthritis. TEE is negative for endocarditis. She was started on ancef by ID and recommended 8 weeks of treatment. 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

## 2014-09-07 NOTE — Progress Notes (Signed)
Physical Therapy Treatment Patient Details Name: Brandy Houston MRN: [id number] DOB: 09-08-1955 Today's Date: 09/07/2014    History of Present Illness MSSA bacteremia with Polyarticular septic arthrits  s/p I&D of BUE's AC joint, BLE knee joint, and right hand    PT Comments    Pt. Able to progress with her gait training today, though remains unsteady.  Continue to recommend SNF for rehab post acutely as she has need for ongoing therapy to maximize her functional level before home.      Follow Up Recommendations  SNF     Equipment Recommendations  None recommended by PT    Recommendations for Other Services       Precautions / Restrictions Precautions Precautions: Fall Restrictions Weight Bearing Restrictions: No RLE Weight Bearing: Weight bearing as tolerated LLE Weight Bearing: Weight bearing as tolerated    Mobility  Bed Mobility Overal bed mobility: Modified Independent Bed Mobility: Supine to Sit     Supine to sit: Modified independent (Device/Increase time)     General bed mobility comments: mod I from supine to sit at EOB with use of bedrail to assist self  Transfers Overall transfer level: Needs assistance Equipment used: Rolling walker (2 wheeled) Transfers: Sit to/from Stand Sit to Stand: Min assist;+2 physical assistance;Mod assist         General transfer comment: Pt. needed +2 min assist to rise to stand from bed; needed mod assist for controlled descent to recliner as pt. tends to sit with little control  Ambulation/Gait Ambulation/Gait assistance: Min assist Ambulation Distance (Feet): 65 Feet Assistive device: Rolling walker (2 wheeled) Gait Pattern/deviations: Step-to pattern;Trunk flexed;Narrow base of support Gait velocity: Decreased   General Gait Details: vc's for posture' min assist for safety and stability due to her unsteadiness and second person closely following with recliner chair   Stairs            Wheelchair Mobility     Modified Rankin (Stroke Patients Only)       Balance                                    Cognition Arousal/Alertness: Awake/alert Behavior During Therapy: WFL for tasks assessed/performed Overall Cognitive Status: Within Functional Limits for tasks assessed                      Exercises General Exercises - Lower Extremity Ankle Circles/Pumps: AROM;Both;10 reps    General Comments        Pertinent Vitals/Pain Pain Assessment: 0-10 Pain Score: 4  Pain Location: left leg Pain Descriptors / Indicators: Discomfort Pain Intervention(s): Monitored during session;Repositioned    Home Living                      Prior Function            PT Goals (current goals can now be found in the care plan section) Progress towards PT goals: Progressing toward goals    Frequency  Min 2X/week    PT Plan Current plan remains appropriate    Co-evaluation             End of Session Equipment Utilized During Treatment: Gait belt Activity Tolerance: Patient limited by fatigue Patient left: in chair;with call bell/phone within reach     Time: 0942-0955 PT Time Calculation (min) (ACUTE ONLY): 13 min  Charges:  $Gait Training: 8-22 mins  G CodesLadona Ridgel 09/07/2014, 10:21 AM [facility] [phone] [place] [phone]

## 2014-09-07 NOTE — Progress Notes (Signed)
[facility] PROGRESS NOTE  Brandy Houston [id number] DOB: 10-Jan-1956 DOA: 08/07/2014 PCP: Chari Manning, NP Interim summary: 59 year old lady with multiple medical problems initially admitted for left shoulder pain, further work up revealed MSSA bacteremia with septic arthritis of the numerous joints.  She underwent arthroscopic irrigation and debridement of the septic arthritis.  TEE is negative for endocarditis.  She was started on ancef by ID and recommended 8 weeks of treatment. Meanwhile her renal function worsened and renal consulted.  She was started on HD sessions.   Assessment/Plan: 1.  MSSA BACTEREMIA with septic arthritis: -Bilateral knee and shoulder septic arthritis seen by ID and Ortho Dr [name], she underwent irrigation and debridement of Right knee, left knee, Right AC & left AC joints, and Irrigation and debridement of right hand flexor tendon sheath. -Currently on cefazolin to continue a total of 8 weeks of treatment, 2/26 as day 1 of 56 days -Underwent TTE on 2/24 & TEE on 2/29 that was negative -She again underwent R knee arthroscopic irrigation and debridement on 3/5 by Dr [name].  -Pain control.  -ID and orthopedics are available as needed.  -ambulate, PT  2. UTI -She had episode of fever on 3/19, possibly from UTI, urine cultures showed pseudomonas sensitive to ciprofloxacin -continue ciprofloxacin day 3/5  3. 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  -started on Temporary HD via R tunned IJ dialysis catheter, last HD 3/19 -urine output poor 127ml yetserday -Renal following, on high dose IV lasix, labs pending this am  -no indications for HD today  4. Diabetes mellitus; -stable -SSI  5. Hepatitis C antibody positive: -FU with ID as outpatient.   6. Anemia; Anemia due to chronic disease and blood loss, acute illness -epo as per renal. She received a dose of ferra heme   -Transfuse to keep hemoglobin greater than 7. Last hemoglobin is 8.1  -await CBc this am  7. Severe malnutrition -Severe hypoalbuminemia, Nutrition supplementation.   8. Hypertension:  well controlled.  9.Urinary retention: -stopped oxybutynin, continue foley  DVT proph: Hep Sq  Code Status: full code.  Family Communication: none at bedside Disposition Plan: pending, plan for SNF when her renal function improves.    Consultants:  Renal  ID  Orthopedics.     Procedures:  HD   Arthroscopic irrigation and debridement of septic arthritis of left knee on 3/5   Arthroscopic extensive synovectomy in all 3 compartments of the left knee ON 3/5  Renal biopsy done on march 4 th 2016 and was found to have C3  Acute diffuse proliferative glomerulonephritis.   Antibiotics:  Ancef and levaquin  HPI/Subjective: Doing better, no new complaints, eating ok  Objective: Filed Vitals:   09/07/14 0815  BP: 140/70  Pulse: 77  Temp: 98.5 F (36.9 C)  Resp: 18    Intake/Output Summary (Last 24 hours) at 09/07/14 1044 Last data filed at 09/06/14 1914  Gross per 24 hour  Intake    480 ml  Output    100 ml  Net    380 ml   Filed Weights   09/04/14 2127 09/05/14 2059 09/06/14 2100  Weight: 94.892 kg (209 lb 3.2 oz) 98.431 kg (217 lb) 99.2 kg (218 lb 11.1 oz)    Exam:   General:  Alert afebrile comfortable, chronically ill appearing, neck with central line  Cardiovascular: s1s2, RRR   Respiratory: bibasilar faint crackles  Abdomen: soft non tender non  distended bowel sounds heard  Musculoskeletal: leg edema 2plus, L knee with swelling and insicion  Data Reviewed: Basic Metabolic Panel:  Recent Labs Lab 09/01/14 0708  09/02/14 0720 09/03/14 0455 09/04/14 1404 09/05/14 0530 09/06/14 0700  NA 133*  < > 130* 131* 131* 130* 127*  K 4.0  < > 3.5 3.9 4.1 4.3 4.3  CL 97  < > 96 96 95* 95* 95*  CO2 27  < > 25 30 29 27 28   GLUCOSE 114*  < > 115* 144* 110* 123*  119*  BUN 19  < > 11 10 15 20 23   CREATININE 5.98*  < > 4.59* 3.78* 5.65* 6.56* 7.50*  CALCIUM 8.2*  < > 7.8* 7.7* 8.2* 8.0* 7.9*  PHOS 4.4  --  3.9  --   --   --   --   < > = values in this interval not displayed. Liver Function Tests:  Recent Labs Lab 09/01/14 0708 09/02/14 0720  ALBUMIN 1.3* 1.4*   No results for input(s): LIPASE, AMYLASE in the last 168 hours. No results for input(s): AMMONIA in the last 168 hours. CBC:  Recent Labs Lab 08/31/14 1141 09/01/14 0708 09/02/14 0645 09/04/14 0500 09/06/14 0700  WBC 12.6* 12.4* 9.9 7.7 6.3  HGB 8.0* 8.1* 7.8* 8.1* 7.6*  HCT 25.3* 25.5* 25.2* 26.3* 24.6*  MCV 93.4 95.1 94.4 96.7 94.6  PLT 378 343 360 340 299   Cardiac Enzymes: No results for input(s): CKTOTAL, CKMB, CKMBINDEX, TROPONINI in the last 168 hours. BNP (last 3 results) No results for input(s): BNP in the last 8760 hours.  ProBNP (last 3 results) No results for input(s): PROBNP in the last 8760 hours.  CBG:  Recent Labs Lab 09/06/14 0756 09/06/14 1134 09/06/14 1638 09/06/14 2137 09/07/14 0808  GLUCAP 137* 136* 111* 124* 105*    Recent Results (from the past 240 hour(s))  Culture, Urine     Status: None   Collection Time: 09/02/14  3:04 PM  Result Value Ref Range Status   Specimen Description URINE, CATHETERIZED  Final   Special Requests NONE  Final   Colony Count   Final    >=100,000 COLONIES/ML Performed at [facility]    Culture   Final    PSEUDOMONAS AERUGINOSA Performed at [facility]    Report Status 09/05/2014 FINAL  Final   Organism ID, Bacteria PSEUDOMONAS AERUGINOSA  Final      Susceptibility   Pseudomonas aeruginosa - MIC*    CEFEPIME 2 SENSITIVE Sensitive     CEFTAZIDIME 4 SENSITIVE Sensitive     CIPROFLOXACIN 0.5 SENSITIVE Sensitive     GENTAMICIN <=1 SENSITIVE Sensitive     IMIPENEM >=16 RESISTANT Resistant     PIP/TAZO 8 SENSITIVE Sensitive     TOBRAMYCIN <=1 SENSITIVE Sensitive     * PSEUDOMONAS  AERUGINOSA     Studies: No results found.  Scheduled Meds: . antiseptic oral rinse  7 mL Mouth Rinse q12n4p  . aspirin  81 mg Oral Daily  .  ceFAZolin (ANCEF) IV  1 g Intravenous Q24H  . chlorhexidine  15 mL Mouth Rinse BID  . ciprofloxacin  500 mg Oral Q24H  . cyclobenzaprine  5 mg Oral BID  . darbepoetin (ARANESP) injection - DIALYSIS  100 mcg Intravenous Q Tue-HD  . famotidine  20 mg Oral Daily  . furosemide  80 mg Intravenous 3 times per day  . heparin subcutaneous  5,000 Units Subcutaneous 3 times per day  . hydrALAZINE  25 mg Oral TID  . insulin aspart  0-9 Units Subcutaneous TID WC  . lanthanum  1,000 mg Oral TID WC  . metoprolol tartrate  75 mg Oral BID  . nicotine  21 mg Transdermal Daily  . polyethylene glycol  17 g Oral BID  . sodium chloride  10-40 mL Intracatheter Q12H  . sodium chloride  10-40 mL Intracatheter Q12H  . sodium chloride  3 mL Intravenous Q12H   Continuous Infusions: . sodium chloride 10 mL/hr at 09/05/14 1857    Principal Problem:   Staphylococcus aureus bacteremia Active Problems:   Septic joint of left shoulder region   Sinus tachycardia   Septic joint of right knee joint   CKD (chronic kidney disease) stage 3, GFR 30-59 ml/min   Septic joint of right hand   SIRS (systemic inflammatory response syndrome)   Benign essential HTN   Hepatitis C   Acute renal failure   Acute renal failure syndrome   Hep C w/ coma, chronic   Joint infection    Time spent: 25 minutes.     Domenic Polite  [facility] Pager [phone] If 7PM-7AM, please contact night-coverage at [email or web address], password [facility] 09/07/2014, 10:44 AM  LOS: 31 days

## 2014-09-07 NOTE — Progress Notes (Signed)
ANTIBIOTIC CONSULT NOTE - FOLLOW UP  Pharmacy Consult for cefazolin and ciprofloxacin Indication: disseminated MSSA infection/pseudomonas UTI  Allergies  Allergen Reactions  . Compazine [Prochlorperazine] Shortness Of Breath and Swelling    TONGUE SWELLS  . Omnipaque [Iohexol] Hives  . Shellfish-Derived Products Anaphylaxis  . Iodinated Diagnostic Agents Rash    Patient Measurements: Height: 5' 7.5" (171.5 cm) Weight: 218 lb 11.1 oz (99.2 kg) IBW/kg (Calculated) : 62.75  Vital Signs: Temp: 98.5 F (36.9 C) (03/24 0815) Temp Source: Oral (03/24 0815) BP: 140/70 mmHg (03/24 0815) Pulse Rate: 77 (03/24 0815) Intake/Output from previous day: 03/23 0701 - 03/24 0700 In: 720 [P.O.:720] Out: 100 [Urine:100] Intake/Output from this shift:    Labs:  Recent Labs  09/04/14 1404 09/05/14 0530 09/06/14 0700  WBC  --   --  6.3  HGB  --   --  7.6*  PLT  --   --  299  CREATININE 5.65* 6.56* 7.50*   Estimated Creatinine Clearance: 9.9 mL/min (by C-G formula based on Cr of 7.5). No results for input(s): VANCOTROUGH, VANCOPEAK, VANCORANDOM, GENTTROUGH, GENTPEAK, GENTRANDOM, TOBRATROUGH, TOBRAPEAK, TOBRARND, AMIKACINPEAK, AMIKACINTROU, AMIKACIN in the last 72 hours.   Microbiology: Recent Results (from the past 720 hour(s))  Culture, blood (routine x 2)     Status: None   Collection Time: 08/09/14 12:15 PM  Result Value Ref Range Status   Specimen Description BLOOD RIGHT HAND  Final   Special Requests   Final    BOTTLES DRAWN AEROBIC AND ANAEROBIC 7CCS BOTH BOTTLES   Culture   Final    STAPHYLOCOCCUS AUREUS Note: RIFAMPIN AND GENTAMICIN SHOULD NOT BE USED AS SINGLE DRUGS FOR TREATMENT OF STAPH INFECTIONS. Note: Gram Stain Report Called to,Read Back By and Verified With: Shirlean Kelly 700AM 08/10/14 [place] Performed at [facility]    Report Status 08/12/2014 FINAL  Final   Organism ID, Bacteria STAPHYLOCOCCUS AUREUS  Final      Susceptibility   Staphylococcus  aureus - MIC*    CLINDAMYCIN <=0.25 SENSITIVE Sensitive     ERYTHROMYCIN <=0.25 SENSITIVE Sensitive     GENTAMICIN <=0.5 SENSITIVE Sensitive     LEVOFLOXACIN 0.25 SENSITIVE Sensitive     OXACILLIN <=0.25 SENSITIVE Sensitive     PENICILLIN >=0.5 RESISTANT Resistant     RIFAMPIN <=0.5 SENSITIVE Sensitive     TRIMETH/SULFA <=10 SENSITIVE Sensitive     VANCOMYCIN 1 SENSITIVE Sensitive     TETRACYCLINE <=1 SENSITIVE Sensitive     MOXIFLOXACIN <=0.25 SENSITIVE Sensitive     * STAPHYLOCOCCUS AUREUS  Culture, blood (routine x 2)     Status: None   Collection Time: 08/09/14 12:25 PM  Result Value Ref Range Status   Specimen Description BLOOD RIGHT ARM  Final   Special Requests   Final    BOTTLES DRAWN AEROBIC AND ANAEROBIC 10CC BOTH BOTTLES   Culture   Final    STAPHYLOCOCCUS AUREUS Note: SUSCEPTIBILITIES PERFORMED ON PREVIOUS CULTURE WITHIN THE LAST 5 DAYS. Note: Gram Stain Report Called to,Read Back By and Verified With: Shirlean Kelly 700AM 08/10/14 [place] Performed at [facility]    Report Status 08/12/2014 FINAL  Final  Culture, routine-abscess     Status: None   Collection Time: 08/10/14 11:54 AM  Result Value Ref Range Status   Specimen Description SHOULDER LEFT  Final   Special Requests NONE  Final   Gram Stain   Final    ABUNDANT WBC PRESENT, PREDOMINANTLY PMN NO SQUAMOUS EPITHELIAL CELLS SEEN MODERATE GRAM  POSITIVE COCCI IN PAIRS IN CLUSTERS Performed at [facility]    Culture   Final    MODERATE STAPHYLOCOCCUS AUREUS Note: RIFAMPIN AND GENTAMICIN SHOULD NOT BE USED AS SINGLE DRUGS FOR TREATMENT OF STAPH INFECTIONS. Performed at [facility]    Report Status 08/13/2014 FINAL  Final   Organism ID, Bacteria STAPHYLOCOCCUS AUREUS  Final      Susceptibility   Staphylococcus aureus - MIC*    CLINDAMYCIN <=0.25 SENSITIVE Sensitive     ERYTHROMYCIN <=0.25 SENSITIVE Sensitive     GENTAMICIN <=0.5 SENSITIVE Sensitive     LEVOFLOXACIN 0.25  SENSITIVE Sensitive     OXACILLIN 0.5 SENSITIVE Sensitive     PENICILLIN >=0.5 RESISTANT Resistant     RIFAMPIN <=0.5 SENSITIVE Sensitive     TRIMETH/SULFA <=10 SENSITIVE Sensitive     VANCOMYCIN 1 SENSITIVE Sensitive     TETRACYCLINE <=1 SENSITIVE Sensitive     MOXIFLOXACIN <=0.25 SENSITIVE Sensitive     * MODERATE STAPHYLOCOCCUS AUREUS  Anaerobic culture     Status: None   Collection Time: 08/10/14 11:54 AM  Result Value Ref Range Status   Specimen Description SHOULDER LEFT  Final   Special Requests NONE  Final   Gram Stain   Final    MODERATE WBC PRESENT, PREDOMINANTLY PMN NO SQUAMOUS EPITHELIAL CELLS SEEN MODERATE GRAM POSITIVE COCCI IN PAIRS IN CLUSTERS Performed at [facility]    Culture   Final    NO ANAEROBES ISOLATED Note: NO PROPIONIBACTERIUM ISOLATED Performed at [facility]    Report Status 08/25/2014 FINAL  Final  Body fluid culture     Status: None   Collection Time: 08/10/14  5:22 PM  Result Value Ref Range Status   Specimen Description SYNOVIAL JOINT RIGHT KNEE  Final   Special Requests Normal  Final   Gram Stain   Final    ABUNDANT WBC PRESENT,BOTH PMN AND MONONUCLEAR NO ORGANISMS SEEN Performed at [facility]    Culture   Final    FEW STAPHYLOCOCCUS AUREUS Note: RIFAMPIN AND GENTAMICIN SHOULD NOT BE USED AS SINGLE DRUGS FOR TREATMENT OF STAPH INFECTIONS. CRITICAL RESULT CALLED TO, READ BACK BY AND VERIFIED WITH: CINDY FLORES RN 08/13/14 AT 60 AM BY [facility] Performed at [facility]    Report Status 08/14/2014 FINAL  Final   Organism ID, Bacteria STAPHYLOCOCCUS AUREUS  Final      Susceptibility   Staphylococcus aureus - MIC*    CLINDAMYCIN <=0.25 SENSITIVE Sensitive     ERYTHROMYCIN <=0.25 SENSITIVE Sensitive     GENTAMICIN <=0.5 SENSITIVE Sensitive     LEVOFLOXACIN 0.25 SENSITIVE Sensitive     OXACILLIN 0.5 SENSITIVE Sensitive     PENICILLIN >=0.5 RESISTANT Resistant     RIFAMPIN <=0.5 SENSITIVE Sensitive      TRIMETH/SULFA <=10 SENSITIVE Sensitive     VANCOMYCIN <=0.5 SENSITIVE Sensitive     TETRACYCLINE <=1 SENSITIVE Sensitive     MOXIFLOXACIN <=0.25 SENSITIVE Sensitive     * FEW STAPHYLOCOCCUS AUREUS  Surgical pcr screen     Status: Abnormal   Collection Time: 08/10/14  6:42 PM  Result Value Ref Range Status   MRSA, PCR NEGATIVE NEGATIVE Final   Staphylococcus aureus POSITIVE (A) NEGATIVE Final    Comment:        The Xpert SA Assay (FDA approved for NASAL specimens in patients over 69 years of age), is one component of a comprehensive surveillance program.  Test performance has been validated by [facility] for  patients greater than or equal to 6 year old. It is not intended to diagnose infection nor to guide or monitor treatment.   Body fluid culture     Status: None   Collection Time: 08/10/14  9:14 PM  Result Value Ref Range Status   Specimen Description FLUID LEFT AC JOINT  Final   Special Requests VANCO, ROCEPHIN  Final   Gram Stain   Final    FEW WBC PRESENT, PREDOMINANTLY PMN MODERATE GRAM POSITIVE COCCI IN PAIRS IN CLUSTERS Gram Stain Report Called to,Read Back By and Verified With: Gram Stain Report Called to,Read Back By and Verified With: Laurance Flatten RN 08/11/13 9:10AM BY [place] Performed at [facility]    Culture   Final    ABUNDANT STAPHYLOCOCCUS AUREUS Note: RIFAMPIN AND GENTAMICIN SHOULD NOT BE USED AS SINGLE DRUGS FOR TREATMENT OF STAPH INFECTIONS. Performed at [facility]    Report Status 08/13/2014 FINAL  Final   Organism ID, Bacteria STAPHYLOCOCCUS AUREUS  Final      Susceptibility   Staphylococcus aureus - MIC*    CLINDAMYCIN <=0.25 SENSITIVE Sensitive     ERYTHROMYCIN <=0.25 SENSITIVE Sensitive     GENTAMICIN <=0.5 SENSITIVE Sensitive     LEVOFLOXACIN 0.25 SENSITIVE Sensitive     OXACILLIN <=0.25 SENSITIVE Sensitive     PENICILLIN >=0.5 RESISTANT Resistant     RIFAMPIN <=0.5 SENSITIVE Sensitive     TRIMETH/SULFA <=10 SENSITIVE  Sensitive     VANCOMYCIN 1 SENSITIVE Sensitive     TETRACYCLINE <=1 SENSITIVE Sensitive     MOXIFLOXACIN <=0.25 SENSITIVE Sensitive     * ABUNDANT STAPHYLOCOCCUS AUREUS  Anaerobic culture     Status: None   Collection Time: 08/10/14  9:14 PM  Result Value Ref Range Status   Specimen Description FLUID LEFT AC JOINT  Final   Special Requests VANCO, ROCEPHIN  Final   Gram Stain   Final    FEW WBC PRESENT, PREDOMINANTLY PMN MODERATE GRAM POSITIVE COCCI IN PAIRS IN CLUSTERS Performed at [facility]    Culture   Final    NO ANAEROBES ISOLATED Performed at [facility]    Report Status 08/15/2014 FINAL  Final  Culture, blood (routine x 2)     Status: None   Collection Time: 08/11/14  7:00 AM  Result Value Ref Range Status   Specimen Description BLOOD RIGHT ARM  Final   Special Requests BOTTLES DRAWN AEROBIC AND ANAEROBIC 6CC  Final   Culture   Final    NO GROWTH 5 DAYS Performed at [facility]    Report Status 08/17/2014 FINAL  Final  Culture, blood (routine x 2)     Status: None   Collection Time: 08/11/14  7:08 AM  Result Value Ref Range Status   Specimen Description BLOOD LEFT HAND  Final   Special Requests BOTTLES DRAWN AEROBIC AND ANAEROBIC [place]  Final   Culture   Final    NO GROWTH 5 DAYS Performed at [facility]    Report Status 08/17/2014 FINAL  Final  Culture, Urine     Status: None   Collection Time: 08/12/14  5:47 AM  Result Value Ref Range Status   Specimen Description URINE, RANDOM  Final   Special Requests NONE  Final   Colony Count   Final    3,000 COLONIES/ML Performed at [facility]    Culture   Final    INSIGNIFICANT GROWTH Performed at [facility]    Report  Status 08/13/2014 FINAL  Final  Culture, blood (routine x 2)     Status: None   Collection Time: 08/15/14  1:15 PM  Result Value Ref Range Status   Specimen Description BLOOD RIGHT HAND  Final   Special Requests BOTTLES DRAWN AEROBIC ONLY 3CC   Final   Culture   Final    NO GROWTH 5 DAYS Performed at [facility]    Report Status 08/21/2014 FINAL  Final  Culture, blood (routine x 2)     Status: None   Collection Time: 08/15/14  3:11 PM  Result Value Ref Range Status   Specimen Description BLOOD RIGHT HAND  Final   Special Requests   Final    BOTTLES DRAWN AEROBIC AND ANAEROBIC BLUE 10CC RED 5CC   Culture   Final    NO GROWTH 5 DAYS Performed at [facility]    Report Status 08/21/2014 FINAL  Final  Culture, Urine     Status: None   Collection Time: 08/17/14  3:14 PM  Result Value Ref Range Status   Specimen Description URINE, CATHETERIZED  Final   Special Requests NONE  Final   Colony Count NO GROWTH Performed at [facility]   Final   Culture NO GROWTH Performed at [facility]   Final   Report Status 08/18/2014 FINAL  Final  Body fluid culture     Status: None   Collection Time: 08/18/14  4:52 PM  Result Value Ref Range Status   Specimen Description FLUID SYNOVIAL LEFT KNEE  Final   Special Requests Normal  Final   Gram Stain   Final    MODERATE WBC PRESENT,BOTH PMN AND MONONUCLEAR NO ORGANISMS SEEN Performed at [facility]    Culture   Final    NO GROWTH 3 DAYS Performed at [facility]    Report Status 08/22/2014 FINAL  Final  Culture, Urine     Status: None   Collection Time: 09/02/14  3:04 PM  Result Value Ref Range Status   Specimen Description URINE, CATHETERIZED  Final   Special Requests NONE  Final   Colony Count   Final    >=100,000 COLONIES/ML Performed at [facility]    Culture   Final    PSEUDOMONAS AERUGINOSA Performed at [facility]    Report Status 09/05/2014 FINAL  Final   Organism ID, Bacteria PSEUDOMONAS AERUGINOSA  Final      Susceptibility   Pseudomonas aeruginosa - MIC*    CEFEPIME 2 SENSITIVE Sensitive     CEFTAZIDIME 4 SENSITIVE Sensitive     CIPROFLOXACIN 0.5 SENSITIVE Sensitive     GENTAMICIN <=1  SENSITIVE Sensitive     IMIPENEM >=16 RESISTANT Resistant     PIP/TAZO 8 SENSITIVE Sensitive     TOBRAMYCIN <=1 SENSITIVE Sensitive     * PSEUDOMONAS AERUGINOSA    Anti-infectives    Start     Dose/Rate Route Frequency Ordered Stop   09/05/14 2000  ciprofloxacin (CIPRO) tablet 500 mg     500 mg Oral Every 24 hours 09/05/14 1820     09/04/14 1100  levofloxacin (LEVAQUIN) tablet 250 mg  Status:  Discontinued    Comments:  For UTI   250 mg Oral Daily 09/04/14 1019 09/05/14 1803   09/02/14 1800  ceFAZolin (ANCEF) IVPB 1 g/50 mL premix     1 g 100 mL/hr over 30 Minutes Intravenous Every 24 hours 09/01/14 1457     08/29/14 0900  ceFAZolin (  ANCEF) IVPB 2 g/50 mL premix     2 g 100 mL/hr over 30 Minutes Intravenous  Once 08/29/14 0854 08/29/14 0925   08/29/14 0802  ceFAZolin (ANCEF) 2-3 GM-% IVPB SOLR  Status:  Discontinued    Comments:  Laughlin, Amy   : cabinet override      08/29/14 0802 08/29/14 0815   08/28/14 2200  ceFAZolin (ANCEF) IVPB 2 g/50 mL premix  Status:  Discontinued     2 g 100 mL/hr over 30 Minutes Intravenous Every M-W-F (Hemodialysis) 08/28/14 1501 09/01/14 1457   08/28/14 1200  ceFAZolin (ANCEF) IVPB 2 g/50 mL premix  Status:  Discontinued    Comments:  On call to xray 3/14   2 g 100 mL/hr over 30 Minutes Intravenous On call 08/28/14 0924 08/28/14 1446   08/23/14 2200  fluconazole (DIFLUCAN) tablet 150 mg     150 mg Oral  Once 08/23/14 2151 08/23/14 2252   08/19/14 0500  ceFAZolin (ANCEF) IVPB 2 g/50 mL premix  Status:  Discontinued     2 g 100 mL/hr over 30 Minutes Intravenous Every 12 hours 08/18/14 1524 08/28/14 1501   08/18/14 1800  ceFAZolin (ANCEF) IVPB 2 g/50 mL premix  Status:  Discontinued     2 g 100 mL/hr over 30 Minutes Intravenous Every 12 hours 08/18/14 1517 08/18/14 1524   08/18/14 1330  ceFAZolin (ANCEF) 2-3 GM-% IVPB SOLR    Comments:  Barley, Jenny   : cabinet override      08/18/14 1330 08/19/14 0144   08/18/14 0600  ceFAZolin (ANCEF) IVPB 1  g/50 mL premix  Status:  Discontinued     1 g 100 mL/hr over 30 Minutes Intravenous Every 12 hours 08/17/14 1822 08/18/14 1517   08/16/14 1800  ceFAZolin (ANCEF) IVPB 2 g/50 mL premix  Status:  Discontinued     2 g 100 mL/hr over 30 Minutes Intravenous Every 12 hours 08/16/14 1355 08/17/14 1822   08/15/14 1200  nafcillin 2 g in dextrose 5 % 50 mL IVPB  Status:  Discontinued     2 g 100 mL/hr over 30 Minutes Intravenous 6 times per day 08/15/14 1028 08/16/14 1253   08/14/14 1400  ceFAZolin (ANCEF) IVPB 2 g/50 mL premix  Status:  Discontinued     2 g 100 mL/hr over 30 Minutes Intravenous 3 times per day 08/14/14 0850 08/15/14 1028   08/12/14 1800  vancomycin (VANCOCIN) 500 mg in sodium chloride 0.9 % 100 mL IVPB  Status:  Discontinued     500 mg 100 mL/hr over 60 Minutes Intravenous Every 12 hours 08/12/14 0705 08/12/14 0721   08/12/14 1800  vancomycin (VANCOCIN) IVPB 750 mg/150 ml premix  Status:  Discontinued     750 mg 150 mL/hr over 60 Minutes Intravenous Every 12 hours 08/12/14 0721 08/12/14 1315   08/10/14 2138  vancomycin (VANCOCIN) powder  Status:  Discontinued       As needed 08/10/14 2138 08/10/14 2153   08/10/14 1600  cefTRIAXone (ROCEPHIN) 2 g in dextrose 5 % 50 mL IVPB - Premix  Status:  Discontinued     2 g 100 mL/hr over 30 Minutes Intravenous Every 24 hours 08/09/14 1555 08/10/14 1432   08/10/14 1600  ceFAZolin (ANCEF) IVPB 2 g/50 mL premix  Status:  Discontinued     2 g 100 mL/hr over 30 Minutes Intravenous 3 times per day 08/10/14 1432 08/14/14 0845   08/10/14 0600  vancomycin (VANCOCIN) IVPB 750 mg/150 ml premix  Status:  Discontinued  750 mg 150 mL/hr over 60 Minutes Intravenous Every 12 hours 08/09/14 2018 08/12/14 0705   08/09/14 1800  vancomycin (VANCOCIN) 2,000 mg in sodium chloride 0.9 % 500 mL IVPB     2,000 mg 250 mL/hr over 120 Minutes Intravenous  Once 08/09/14 1616 08/09/14 1929   08/09/14 1630  cefTRIAXone (ROCEPHIN) 2 g in dextrose 5 % 50 mL IVPB -  Premix     2 g 100 mL/hr over 30 Minutes Intravenous  Once 08/09/14 1553 08/09/14 1653      Assessment: 66 yoF admitted with intractable shoulder pain, 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 Patient continues on Ancef, planning for 8 weeks of therapy (end date 4/21). Patient with ARF, started on HD this admission with original plan to continue with MWF HD sessions until patient experiences renal recovery. However, MD assessing daily for HD needs and due to the lack of consistent HD schedule, will adjust Ancef to renally adjusted dosing.  Of note, she also has developed a pseudomonas UTI and was started on ciprofloxacin.  Last HD session was 3/19. Patient remains afebrile, WBC wnl.  Goal of Therapy:  Eradication of infection  Plan:  -Ancef 1 g IV q24h- end date of 4/21 has been entered -ciprofloxacin 500mg  PO q24h -Monitor UOP and SCr for recovery of renal function -Monitor clinical progress  Dwight Burdo D. Yves Fodor, PharmD, BCPS Clinical Pharmacist Pager: [phone] 09/07/2014 8:58 AM

## 2014-09-08 LAB — CBC
HCT: 25.4 % — ABNORMAL LOW (ref 36.0–46.0)
Hemoglobin: 7.9 g/dL — ABNORMAL LOW (ref 12.0–15.0)
MCH: 29.2 pg (ref 26.0–34.0)
MCHC: 31.1 g/dL (ref 30.0–36.0)
MCV: 93.7 fL (ref 78.0–100.0)
Platelets: 350 10*3/uL (ref 150–400)
RBC: 2.71 MIL/uL — ABNORMAL LOW (ref 3.87–5.11)
RDW: 15.8 % — ABNORMAL HIGH (ref 11.5–15.5)
WBC: 6.8 10*3/uL (ref 4.0–10.5)

## 2014-09-08 LAB — BASIC METABOLIC PANEL
Anion gap: 7 (ref 5–15)
BUN: 31 mg/dL — ABNORMAL HIGH (ref 6–23)
CO2: 24 mmol/L (ref 19–32)
Calcium: 8 mg/dL — ABNORMAL LOW (ref 8.4–10.5)
Chloride: 95 mmol/L — ABNORMAL LOW (ref 96–112)
Creatinine, Ser: 8.73 mg/dL — ABNORMAL HIGH (ref 0.50–1.10)
GFR calc Af Amer: 5 mL/min — ABNORMAL LOW (ref >90)
GFR calc non Af Amer: 4 mL/min — ABNORMAL LOW (ref >90)
Glucose, Bld: 111 mg/dL — ABNORMAL HIGH (ref 70–99)
Potassium: 4.6 mmol/L (ref 3.5–5.1)
Sodium: 126 mmol/L — ABNORMAL LOW (ref 135–145)

## 2014-09-08 LAB — GLUCOSE, CAPILLARY
Glucose-Capillary: 114 mg/dL — ABNORMAL HIGH (ref 70–99)
Glucose-Capillary: 146 mg/dL — ABNORMAL HIGH (ref 70–99)
Glucose-Capillary: 185 mg/dL — ABNORMAL HIGH (ref 70–99)
Glucose-Capillary: 130 mg/dL — ABNORMAL HIGH (ref 70–99)

## 2014-09-08 MED ORDER — DARBEPOETIN ALFA 100 MCG/0.5ML IJ SOSY
100.0000 ug | PREFILLED_SYRINGE | INTRAMUSCULAR | Status: DC
  Administered 2014-09-08: 100 ug via SUBCUTANEOUS
  Filled 2014-09-08 (×2): qty 0.5

## 2014-09-08 MED ORDER — FUROSEMIDE 10 MG/ML IJ SOLN
120.0000 mg | Freq: Three times a day (TID) | INTRAMUSCULAR | Status: DC
  Administered 2014-09-08 – 2014-09-11 (×9): 120 mg via INTRAVENOUS
  Filled 2014-09-08 (×13): qty 12

## 2014-09-08 NOTE — Progress Notes (Signed)
Rehab admissions - I was asked to do a prescreen for inpatient rehab for this patient.  Patient well known to me from rehab consult done 08/14/14 by Dr Naaman Plummer and from following patient's progress.  Please see my note from 08/30/14.  As long as patient continues on dialysis, has not been declared chronic and has not been clipped, I really cannot admit to acute inpatient rehab.  Once patient comes off HD or is declared chronic and gets clipped, I can then look at potential for inpatient rehab admission.  Call me for questions.  RC:[phone]

## 2014-09-08 NOTE — Progress Notes (Addendum)
[facility] PROGRESS NOTE  Brandy Houston [id number] DOB: [birth year] DOA: 08/07/2014 PCP: Chari Manning, NP Interim summary: 59 year old lady with multiple medical problems initially admitted for left shoulder pain, further work up revealed MSSA bacteremia with septic arthritis of the numerous joints.  She underwent arthroscopic irrigation and debridement of the septic arthritis.  TEE is negative for endocarditis.  She was started on ancef by ID and recommended 8 weeks of treatment. Meanwhile her renal function worsened and renal consulted.  She was started on HD sessions.   Assessment/Plan: 1.  MSSA BACTEREMIA with septic arthritis: -Bilateral knee and shoulder septic arthritis seen by ID and Orthopedics Dr [name], she underwent irrigation and debridement of Right knee, left knee, Right AC & left AC joints, and Irrigation and debridement of right hand flexor tendon sheath. -Currently on cefazolin to continue a total of 8 weeks of treatment, 2/26 as day 1 of 56 days -Underwent TTE on 2/24 & TEE on 2/29 that was negative -She again underwent R knee arthroscopic irrigation and debridement on 3/5 by Dr [name].  -Pain control.  -ID and orthopedics are available as needed.  -ambulate, PT  2. UTI -She had episode of fever on 3/19, possibly from UTI, urine cultures showed pseudomonas sensitive to ciprofloxacin -continue ciprofloxacin day 4/5  3. 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  -started on Temporary HD via R tunned IJ dialysis catheter, last HD 3/19 -urine output poor 260ml yetserday -Renal following, on high dose IV lasix -some symptoms of uremia and volume overloaded-significantly -suspect will need HD in 1-2days, called and d/w Dr.Webb considering HD tomorrow if poor response  4. Diabetes mellitus; -stable -SSI  5. Hepatitis C antibody positive: -FU with ID as outpatient.   6. Anemia; Anemia  due to chronic disease and blood loss, acute illness -epo as per renal. She received a dose of ferra heme  -Transfuse to keep hemoglobin greater than 7. Last hemoglobin is 8.1  -stable, monitor  7. Severe malnutrition -likely due to severe sepsis and third spacing -Severe hypoalbuminemia, Nutrition supplementation.   8. Hypertension:  well controlled.  9.Urinary retention: -stopped oxybutynin, continue foley  DVT proph: Hep Sq  Code Status: full code.  Family Communication: none at bedside Disposition Plan: pending, plan for SNF when her renal function improves.    Consultants:  Renal  ID  Orthopedics.     Procedures:  HD   Arthroscopic irrigation and debridement of septic arthritis of left knee on 3/5   Arthroscopic extensive synovectomy in all 3 compartments of the left knee ON 3/5  Renal biopsy done on march 4 th 2016 and was found to have C3  Acute diffuse proliferative glomerulonephritis.   Antibiotics:  Ancef and levaquin  HPI/Subjective: Some anorexia, nausea, dyspneic with activity  Objective: Filed Vitals:   09/08/14 0954  BP: 138/67  Pulse: 81  Temp: 98.3 F (36.8 C)  Resp: 18    Intake/Output Summary (Last 24 hours) at 09/08/14 1137 Last data filed at 09/08/14 [id number]  Gross per 24 hour  Intake    740 ml  Output    225 ml  Net    515 ml   Filed Weights   09/05/14 2059 09/06/14 2100 09/07/14 2052  Weight: 98.431 kg (217 lb) 99.2 kg (218 lb 11.1 oz) 99.428 kg (219 lb 3.2 oz)    Exam:   General:  Alert afebrile comfortable, chronically ill appearing, neck with  central line  Cardiovascular: s1s2, RRR   Respiratory: bibasilar faint crackles  Abdomen: soft non tender non distended bowel sounds heard  Musculoskeletal: leg edema 3plus, L knee with swelling and insicion c/d/i  Data Reviewed: Basic Metabolic Panel:  Recent Labs Lab 09/02/14 0720  09/04/14 1404 09/05/14 0530 09/06/14 0700 09/07/14 1050 09/08/14 0407  NA 130*   < > 131* 130* 127* 129* 126*  K 3.5  < > 4.1 4.3 4.3 4.6 4.6  CL 96  < > 95* 95* 95* 94* 95*  CO2 25  < > 29 27 28 25 24   GLUCOSE 115*  < > 110* 123* 119* 135* 111*  BUN 11  < > 15 20 23  28* 31*  CREATININE 4.59*  < > 5.65* 6.56* 7.50* 8.17* 8.73*  CALCIUM 7.8*  < > 8.2* 8.0* 7.9* 8.2* 8.0*  PHOS 3.9  --   --   --   --  4.8*  --   < > = values in this interval not displayed. Liver Function Tests:  Recent Labs Lab 09/02/14 0720 09/07/14 1050  ALBUMIN 1.4* 1.4*   No results for input(s): LIPASE, AMYLASE in the last 168 hours. No results for input(s): AMMONIA in the last 168 hours. CBC:  Recent Labs Lab 09/02/14 0645 09/04/14 0500 09/06/14 0700 09/07/14 1050 09/08/14 0407  WBC 9.9 7.7 6.3 6.9 6.8  HGB 7.8* 8.1* 7.6* 7.7* 7.9*  HCT 25.2* 26.3* 24.6* 24.6* 25.4*  MCV 94.4 96.7 94.6 95.0 93.7  PLT 360 340 299 304 350   Cardiac Enzymes: No results for input(s): CKTOTAL, CKMB, CKMBINDEX, TROPONINI in the last 168 hours. BNP (last 3 results) No results for input(s): BNP in the last 8760 hours.  ProBNP (last 3 results) No results for input(s): PROBNP in the last 8760 hours.  CBG:  Recent Labs Lab 09/07/14 0808 09/07/14 1212 09/07/14 1724 09/07/14 2046 09/08/14 0748  GLUCAP 105* 125* 117* 126* 114*    Recent Results (from the past 240 hour(s))  Culture, Urine     Status: None   Collection Time: 09/02/14  3:04 PM  Result Value Ref Range Status   Specimen Description URINE, CATHETERIZED  Final   Special Requests NONE  Final   Colony Count   Final    >=100,000 COLONIES/ML Performed at [facility]    Culture   Final    PSEUDOMONAS AERUGINOSA Performed at [facility]    Report Status 09/05/2014 FINAL  Final   Organism ID, Bacteria PSEUDOMONAS AERUGINOSA  Final      Susceptibility   Pseudomonas aeruginosa - MIC*    CEFEPIME 2 SENSITIVE Sensitive     CEFTAZIDIME 4 SENSITIVE Sensitive     CIPROFLOXACIN 0.5 SENSITIVE Sensitive     GENTAMICIN  <=1 SENSITIVE Sensitive     IMIPENEM >=16 RESISTANT Resistant     PIP/TAZO 8 SENSITIVE Sensitive     TOBRAMYCIN <=1 SENSITIVE Sensitive     * PSEUDOMONAS AERUGINOSA     Studies: No results found.  Scheduled Meds: . antiseptic oral rinse  7 mL Mouth Rinse q12n4p  . aspirin  81 mg Oral Daily  .  ceFAZolin (ANCEF) IV  1 g Intravenous Q24H  . chlorhexidine  15 mL Mouth Rinse BID  . ciprofloxacin  500 mg Oral Q24H  . cyclobenzaprine  5 mg Oral BID  . darbepoetin (ARANESP) injection - NON-DIALYSIS  100 mcg Subcutaneous Q Fri-1800  . famotidine  20 mg Oral Daily  . furosemide  120 mg Intravenous  3 times per day  . heparin subcutaneous  5,000 Units Subcutaneous 3 times per day  . hydrALAZINE  25 mg Oral TID  . insulin aspart  0-9 Units Subcutaneous TID WC  . lanthanum  1,000 mg Oral TID WC  . metoprolol tartrate  75 mg Oral BID  . nicotine  21 mg Transdermal Daily  . polyethylene glycol  17 g Oral BID  . sodium chloride  10-40 mL Intracatheter Q12H  . sodium chloride  10-40 mL Intracatheter Q12H  . sodium chloride  3 mL Intravenous Q12H   Continuous Infusions: . sodium chloride 10 mL/hr at 09/05/14 1857    Principal Problem:   Staphylococcus aureus bacteremia Active Problems:   Septic joint of left shoulder region   Sinus tachycardia   Septic joint of right knee joint   CKD (chronic kidney disease) stage 3, GFR 30-59 ml/min   Septic joint of right hand   SIRS (systemic inflammatory response syndrome)   Benign essential HTN   Hepatitis C   Acute renal failure   Acute renal failure syndrome   Hep C w/ coma, chronic   Joint infection    Time spent: 25 minutes.     Domenic Polite  [facility] Pager [phone] If 7PM-7AM, please contact night-coverage at [email or web address], password [facility] 09/08/2014, 11:37 AM  LOS: 32 days

## 2014-09-08 NOTE — Progress Notes (Signed)
[facility] ROUNDING NOTE   Subjective:   Interval History: no complaints  Foley in with increased urine output responding to lasix   Objective:  Vital signs in last 24 hours:  Temp:  [97.7 F (36.5 C)-98.9 F (37.2 C)] 98.3 F (36.8 C) (03/25 0954) Pulse Rate:  [77-81] 81 (03/25 0954) Resp:  [16-18] 18 (03/25 0954) BP: (138-146)/(67-79) 138/67 mmHg (03/25 0954) SpO2:  [94 %-97 %] 97 % (03/25 0954) Weight:  [99.428 kg (219 lb 3.2 oz)] 99.428 kg (219 lb 3.2 oz) (03/24 2052)  Weight change: 0.228 kg (8.1 oz) Filed Weights   09/05/14 2059 09/06/14 2100 09/07/14 2052  Weight: 98.431 kg (217 lb) 99.2 kg (218 lb 11.1 oz) 99.428 kg (219 lb 3.2 oz)    Intake/Output: I/O last 3 completed shifts: In: 380 [P.O.:360; I.V.:20] Out: 325 [Urine:325]   Intake/Output this shift:  Total I/O In: 480 [P.O.:480] Out: 0   CVS- RRR RS- CTA ABD- BS present soft non-distended EXT-  2+ edema   Basic Metabolic Panel:  Recent Labs Lab 09/02/14 0720  09/04/14 1404 09/05/14 0530 09/06/14 0700 09/07/14 1050 09/08/14 0407  NA 130*  < > 131* 130* 127* 129* 126*  K 3.5  < > 4.1 4.3 4.3 4.6 4.6  CL 96  < > 95* 95* 95* 94* 95*  CO2 25  < > 29 27 28 25 24   GLUCOSE 115*  < > 110* 123* 119* 135* 111*  BUN 11  < > 15 20 23  28* 31*  CREATININE 4.59*  < > 5.65* 6.56* 7.50* 8.17* 8.73*  CALCIUM 7.8*  < > 8.2* 8.0* 7.9* 8.2* 8.0*  PHOS 3.9  --   --   --   --  4.8*  --   < > = values in this interval not displayed.  Liver Function Tests:  Recent Labs Lab 09/02/14 0720 09/07/14 1050  ALBUMIN 1.4* 1.4*   No results for input(s): LIPASE, AMYLASE in the last 168 hours. No results for input(s): AMMONIA in the last 168 hours.  CBC:  Recent Labs Lab 09/02/14 0645 09/04/14 0500 09/06/14 0700 09/07/14 1050 09/08/14 0407  WBC 9.9 7.7 6.3 6.9 6.8  HGB 7.8* 8.1* 7.6* 7.7* 7.9*  HCT 25.2* 26.3* 24.6* 24.6* 25.4*  MCV 94.4 96.7 94.6 95.0 93.7  PLT 360 340 299 304 350     Cardiac Enzymes: No results for input(s): CKTOTAL, CKMB, CKMBINDEX, TROPONINI in the last 168 hours.  BNP: Invalid input(s): POCBNP  CBG:  Recent Labs Lab 09/07/14 0808 09/07/14 1212 09/07/14 1724 09/07/14 2046 09/08/14 0748  GLUCAP 105* 125* 117* 126* 114*    Microbiology: Results for orders placed or performed during the hospital encounter of 08/07/14  Culture, blood (routine x 2)     Status: None   Collection Time: 08/09/14 12:15 PM  Result Value Ref Range Status   Specimen Description BLOOD RIGHT HAND  Final   Special Requests   Final    BOTTLES DRAWN AEROBIC AND ANAEROBIC 7CCS BOTH BOTTLES   Culture   Final    STAPHYLOCOCCUS AUREUS Note: RIFAMPIN AND GENTAMICIN SHOULD NOT BE USED AS SINGLE DRUGS FOR TREATMENT OF STAPH INFECTIONS. Note: Gram Stain Report Called to,Read Back By and Verified With: Shirlean Kelly 700AM 08/10/14 [place] Performed at [facility]    Report Status 08/12/2014 FINAL  Final   Organism ID, Bacteria STAPHYLOCOCCUS AUREUS  Final      Susceptibility   Staphylococcus aureus - MIC*    CLINDAMYCIN <=  0.25 SENSITIVE Sensitive     ERYTHROMYCIN <=0.25 SENSITIVE Sensitive     GENTAMICIN <=0.5 SENSITIVE Sensitive     LEVOFLOXACIN 0.25 SENSITIVE Sensitive     OXACILLIN <=0.25 SENSITIVE Sensitive     PENICILLIN >=0.5 RESISTANT Resistant     RIFAMPIN <=0.5 SENSITIVE Sensitive     TRIMETH/SULFA <=10 SENSITIVE Sensitive     VANCOMYCIN 1 SENSITIVE Sensitive     TETRACYCLINE <=1 SENSITIVE Sensitive     MOXIFLOXACIN <=0.25 SENSITIVE Sensitive     * STAPHYLOCOCCUS AUREUS  Culture, blood (routine x 2)     Status: None   Collection Time: 08/09/14 12:25 PM  Result Value Ref Range Status   Specimen Description BLOOD RIGHT ARM  Final   Special Requests   Final    BOTTLES DRAWN AEROBIC AND ANAEROBIC 10CC BOTH BOTTLES   Culture   Final    STAPHYLOCOCCUS AUREUS Note: SUSCEPTIBILITIES PERFORMED ON PREVIOUS CULTURE WITHIN THE LAST 5 DAYS. Note:  Gram Stain Report Called to,Read Back By and Verified With: Shirlean Kelly 700AM 08/10/14 [place] Performed at [facility]    Report Status 08/12/2014 FINAL  Final  Culture, routine-abscess     Status: None   Collection Time: 08/10/14 11:54 AM  Result Value Ref Range Status   Specimen Description SHOULDER LEFT  Final   Special Requests NONE  Final   Gram Stain   Final    ABUNDANT WBC PRESENT, PREDOMINANTLY PMN NO SQUAMOUS EPITHELIAL CELLS SEEN MODERATE GRAM POSITIVE COCCI IN PAIRS IN CLUSTERS Performed at [facility]    Culture   Final    MODERATE STAPHYLOCOCCUS AUREUS Note: RIFAMPIN AND GENTAMICIN SHOULD NOT BE USED AS SINGLE DRUGS FOR TREATMENT OF STAPH INFECTIONS. Performed at [facility]    Report Status 08/13/2014 FINAL  Final   Organism ID, Bacteria STAPHYLOCOCCUS AUREUS  Final      Susceptibility   Staphylococcus aureus - MIC*    CLINDAMYCIN <=0.25 SENSITIVE Sensitive     ERYTHROMYCIN <=0.25 SENSITIVE Sensitive     GENTAMICIN <=0.5 SENSITIVE Sensitive     LEVOFLOXACIN 0.25 SENSITIVE Sensitive     OXACILLIN 0.5 SENSITIVE Sensitive     PENICILLIN >=0.5 RESISTANT Resistant     RIFAMPIN <=0.5 SENSITIVE Sensitive     TRIMETH/SULFA <=10 SENSITIVE Sensitive     VANCOMYCIN 1 SENSITIVE Sensitive     TETRACYCLINE <=1 SENSITIVE Sensitive     MOXIFLOXACIN <=0.25 SENSITIVE Sensitive     * MODERATE STAPHYLOCOCCUS AUREUS  Anaerobic culture     Status: None   Collection Time: 08/10/14 11:54 AM  Result Value Ref Range Status   Specimen Description SHOULDER LEFT  Final   Special Requests NONE  Final   Gram Stain   Final    MODERATE WBC PRESENT, PREDOMINANTLY PMN NO SQUAMOUS EPITHELIAL CELLS SEEN MODERATE GRAM POSITIVE COCCI IN PAIRS IN CLUSTERS Performed at [facility]    Culture   Final    NO ANAEROBES ISOLATED Note: NO PROPIONIBACTERIUM ISOLATED Performed at [facility]    Report Status 08/25/2014 FINAL  Final  Body fluid  culture     Status: None   Collection Time: 08/10/14  5:22 PM  Result Value Ref Range Status   Specimen Description SYNOVIAL JOINT RIGHT KNEE  Final   Special Requests Normal  Final   Gram Stain   Final    ABUNDANT WBC PRESENT,BOTH PMN AND MONONUCLEAR NO ORGANISMS SEEN Performed at [facility]   Final  FEW STAPHYLOCOCCUS AUREUS Note: RIFAMPIN AND GENTAMICIN SHOULD NOT BE USED AS SINGLE DRUGS FOR TREATMENT OF STAPH INFECTIONS. CRITICAL RESULT CALLED TO, READ BACK BY AND VERIFIED WITH: CINDY FLORES RN 08/13/14 AT 57 AM BY [facility] Performed at [facility]    Report Status 08/14/2014 FINAL  Final   Organism ID, Bacteria STAPHYLOCOCCUS AUREUS  Final      Susceptibility   Staphylococcus aureus - MIC*    CLINDAMYCIN <=0.25 SENSITIVE Sensitive     ERYTHROMYCIN <=0.25 SENSITIVE Sensitive     GENTAMICIN <=0.5 SENSITIVE Sensitive     LEVOFLOXACIN 0.25 SENSITIVE Sensitive     OXACILLIN 0.5 SENSITIVE Sensitive     PENICILLIN >=0.5 RESISTANT Resistant     RIFAMPIN <=0.5 SENSITIVE Sensitive     TRIMETH/SULFA <=10 SENSITIVE Sensitive     VANCOMYCIN <=0.5 SENSITIVE Sensitive     TETRACYCLINE <=1 SENSITIVE Sensitive     MOXIFLOXACIN <=0.25 SENSITIVE Sensitive     * FEW STAPHYLOCOCCUS AUREUS  Surgical pcr screen     Status: Abnormal   Collection Time: 08/10/14  6:42 PM  Result Value Ref Range Status   MRSA, PCR NEGATIVE NEGATIVE Final   Staphylococcus aureus POSITIVE (A) NEGATIVE Final    Comment:        The Xpert SA Assay (FDA approved for NASAL specimens in patients over 84 years of age), is one component of a comprehensive surveillance program.  Test performance has been validated by [facility] for patients greater than or equal to 7 year old. It is not intended to diagnose infection nor to guide or monitor treatment.   Body fluid culture     Status: None   Collection Time: 08/10/14  9:14 PM  Result Value Ref Range Status   Specimen Description  FLUID LEFT AC JOINT  Final   Special Requests VANCO, ROCEPHIN  Final   Gram Stain   Final    FEW WBC PRESENT, PREDOMINANTLY PMN MODERATE GRAM POSITIVE COCCI IN PAIRS IN CLUSTERS Gram Stain Report Called to,Read Back By and Verified With: Gram Stain Report Called to,Read Back By and Verified With: Laurance Flatten RN 08/11/13 9:10AM BY [place] Performed at [facility]    Culture   Final    ABUNDANT STAPHYLOCOCCUS AUREUS Note: RIFAMPIN AND GENTAMICIN SHOULD NOT BE USED AS SINGLE DRUGS FOR TREATMENT OF STAPH INFECTIONS. Performed at [facility]    Report Status 08/13/2014 FINAL  Final   Organism ID, Bacteria STAPHYLOCOCCUS AUREUS  Final      Susceptibility   Staphylococcus aureus - MIC*    CLINDAMYCIN <=0.25 SENSITIVE Sensitive     ERYTHROMYCIN <=0.25 SENSITIVE Sensitive     GENTAMICIN <=0.5 SENSITIVE Sensitive     LEVOFLOXACIN 0.25 SENSITIVE Sensitive     OXACILLIN <=0.25 SENSITIVE Sensitive     PENICILLIN >=0.5 RESISTANT Resistant     RIFAMPIN <=0.5 SENSITIVE Sensitive     TRIMETH/SULFA <=10 SENSITIVE Sensitive     VANCOMYCIN 1 SENSITIVE Sensitive     TETRACYCLINE <=1 SENSITIVE Sensitive     MOXIFLOXACIN <=0.25 SENSITIVE Sensitive     * ABUNDANT STAPHYLOCOCCUS AUREUS  Anaerobic culture     Status: None   Collection Time: 08/10/14  9:14 PM  Result Value Ref Range Status   Specimen Description FLUID LEFT AC JOINT  Final   Special Requests VANCO, ROCEPHIN  Final   Gram Stain   Final    FEW WBC PRESENT, PREDOMINANTLY PMN MODERATE GRAM POSITIVE COCCI IN PAIRS IN CLUSTERS Performed at [facility]    Culture   Final    NO ANAEROBES ISOLATED Performed at [facility]    Report Status 08/15/2014 FINAL  Final  Culture, blood (routine x 2)     Status: None   Collection Time: 08/11/14  7:00 AM  Result Value Ref Range Status   Specimen Description BLOOD RIGHT ARM  Final   Special Requests BOTTLES DRAWN AEROBIC AND ANAEROBIC 6CC  Final   Culture    Final    NO GROWTH 5 DAYS Performed at [facility]    Report Status 08/17/2014 FINAL  Final  Culture, blood (routine x 2)     Status: None   Collection Time: 08/11/14  7:08 AM  Result Value Ref Range Status   Specimen Description BLOOD LEFT HAND  Final   Special Requests BOTTLES DRAWN AEROBIC AND ANAEROBIC [place]  Final   Culture   Final    NO GROWTH 5 DAYS Performed at [facility]    Report Status 08/17/2014 FINAL  Final  Culture, Urine     Status: None   Collection Time: 08/12/14  5:47 AM  Result Value Ref Range Status   Specimen Description URINE, RANDOM  Final   Special Requests NONE  Final   Colony Count   Final    3,000 COLONIES/ML Performed at [facility]    Culture   Final    INSIGNIFICANT GROWTH Performed at [facility]    Report Status 08/13/2014 FINAL  Final  Culture, blood (routine x 2)     Status: None   Collection Time: 08/15/14  1:15 PM  Result Value Ref Range Status   Specimen Description BLOOD RIGHT HAND  Final   Special Requests BOTTLES DRAWN AEROBIC ONLY 3CC  Final   Culture   Final    NO GROWTH 5 DAYS Performed at [facility]    Report Status 08/21/2014 FINAL  Final  Culture, blood (routine x 2)     Status: None   Collection Time: 08/15/14  3:11 PM  Result Value Ref Range Status   Specimen Description BLOOD RIGHT HAND  Final   Special Requests   Final    BOTTLES DRAWN AEROBIC AND ANAEROBIC BLUE 10CC RED 5CC   Culture   Final    NO GROWTH 5 DAYS Performed at [facility]    Report Status 08/21/2014 FINAL  Final  Culture, Urine     Status: None   Collection Time: 08/17/14  3:14 PM  Result Value Ref Range Status   Specimen Description URINE, CATHETERIZED  Final   Special Requests NONE  Final   Colony Count NO GROWTH Performed at [facility]   Final   Culture NO GROWTH Performed at [facility]   Final   Report Status 08/18/2014 FINAL  Final  Body fluid culture     Status:  None   Collection Time: 08/18/14  4:52 PM  Result Value Ref Range Status   Specimen Description FLUID SYNOVIAL LEFT KNEE  Final   Special Requests Normal  Final   Gram Stain   Final    MODERATE WBC PRESENT,BOTH PMN AND MONONUCLEAR NO ORGANISMS SEEN Performed at [facility]    Culture   Final    NO GROWTH 3 DAYS Performed at [facility]    Report Status 08/22/2014 FINAL  Final  Culture, Urine     Status: None   Collection Time: 09/02/14  3:04 PM  Result Value Ref  Range Status   Specimen Description URINE, CATHETERIZED  Final   Special Requests NONE  Final   Colony Count   Final    >=100,000 COLONIES/ML Performed at [facility]    Culture   Final    PSEUDOMONAS AERUGINOSA Performed at [facility]    Report Status 09/05/2014 FINAL  Final   Organism ID, Bacteria PSEUDOMONAS AERUGINOSA  Final      Susceptibility   Pseudomonas aeruginosa - MIC*    CEFEPIME 2 SENSITIVE Sensitive     CEFTAZIDIME 4 SENSITIVE Sensitive     CIPROFLOXACIN 0.5 SENSITIVE Sensitive     GENTAMICIN <=1 SENSITIVE Sensitive     IMIPENEM >=16 RESISTANT Resistant     PIP/TAZO 8 SENSITIVE Sensitive     TOBRAMYCIN <=1 SENSITIVE Sensitive     * PSEUDOMONAS AERUGINOSA    Coagulation Studies: No results for input(s): LABPROT, INR in the last 72 hours.  Urinalysis: No results for input(s): COLORURINE, LABSPEC, PHURINE, GLUCOSEU, HGBUR, BILIRUBINUR, KETONESUR, PROTEINUR, UROBILINOGEN, NITRITE, LEUKOCYTESUR in the last 72 hours.  Invalid input(s): APPERANCEUR    Imaging: No results found.   Medications:   . sodium chloride 10 mL/hr at 09/05/14 1857   . antiseptic oral rinse  7 mL Mouth Rinse q12n4p  . aspirin  81 mg Oral Daily  .  ceFAZolin (ANCEF) IV  1 g Intravenous Q24H  . chlorhexidine  15 mL Mouth Rinse BID  . ciprofloxacin  500 mg Oral Q24H  . cyclobenzaprine  5 mg Oral BID  . darbepoetin (ARANESP) injection - NON-DIALYSIS  100 mcg Subcutaneous Q Fri-1800   . famotidine  20 mg Oral Daily  . furosemide  80 mg Intravenous 3 times per day  . heparin subcutaneous  5,000 Units Subcutaneous 3 times per day  . hydrALAZINE  25 mg Oral TID  . insulin aspart  0-9 Units Subcutaneous TID WC  . lanthanum  1,000 mg Oral TID WC  . metoprolol tartrate  75 mg Oral BID  . nicotine  21 mg Transdermal Daily  . polyethylene glycol  17 g Oral BID  . sodium chloride  10-40 mL Intracatheter Q12H  . sodium chloride  10-40 mL Intracatheter Q12H  . sodium chloride  3 mL Intravenous Q12H   sodium chloride, sodium chloride, acetaminophen, diphenhydrAMINE, feeding supplement (NEPRO CARB STEADY), gi cocktail, guaiFENesin-dextromethorphan, heparin, heparin, hydrALAZINE, HYDROcodone-acetaminophen, HYDROmorphone (DILAUDID) injection, lidocaine (PF), lidocaine-prilocaine, magic mouthwash w/lidocaine, ondansetron (ZOFRAN) IV, pentafluoroprop-tetrafluoroeth, sodium chloride, sodium chloride, sorbitol, zolpidem  Assessment/ Plan:  year old lady with multiple medical problems initially admitted for left shoulder pain, further work up revealed MSSA bacteremia with septic arthritis of the numerous joints. She underwent arthroscopic irrigation and debridement of the septic arthritis. TEE is negative for endocarditis. She was started on ancef by ID and recommended 8 weeks of treatment. 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

## 2014-09-08 NOTE — Clinical Social Work Note (Signed)
CSW consulted with physical therapist Rolinda Roan regarding the possibility of CIR reevaluating patient and this was discussed. A note was entered by Mickel Baas and a pre-screening evaluation was requested by CIR.  CSW will continue to follow and assist patient with discharge planning to a skilled facility for short-term rehab if this remains the most appropriate discharge plan.  Brileigh Sevcik Givens, MSW, LCSW Licensed Clinical Social Worker [place] [phone]

## 2014-09-08 NOTE — Progress Notes (Signed)
Physical therapist discussed pt case with Crawford Givens, CSW. Feel that pt may benefit from a re-evaluation for CIR as she is able to tolerate more functional activity than previous sessions. Therapy will reassess discharge disposition next opportunity for treatment, and will put in for another CIR screen for appropriateness. Please page me with any questions. Thank you.  Rolinda Roan, PT, DPT Acute Rehabilitation Services Pager: [phone]

## 2014-09-09 LAB — CBC
HCT: 27.2 % — ABNORMAL LOW (ref 36.0–46.0)
Hemoglobin: 8.5 g/dL — ABNORMAL LOW (ref 12.0–15.0)
MCH: 29.3 pg (ref 26.0–34.0)
MCHC: 31.3 g/dL (ref 30.0–36.0)
MCV: 93.8 fL (ref 78.0–100.0)
Platelets: 397 10*3/uL (ref 150–400)
RBC: 2.9 MIL/uL — ABNORMAL LOW (ref 3.87–5.11)
RDW: 15 % (ref 11.5–15.5)
WBC: 7.5 10*3/uL (ref 4.0–10.5)

## 2014-09-09 LAB — GLUCOSE, CAPILLARY
Glucose-Capillary: 125 mg/dL — ABNORMAL HIGH (ref 70–99)
Glucose-Capillary: 125 mg/dL — ABNORMAL HIGH (ref 70–99)
Glucose-Capillary: 191 mg/dL — ABNORMAL HIGH (ref 70–99)
Glucose-Capillary: 129 mg/dL — ABNORMAL HIGH (ref 70–99)

## 2014-09-09 LAB — BASIC METABOLIC PANEL
Anion gap: 9 (ref 5–15)
BUN: 35 mg/dL — ABNORMAL HIGH (ref 6–23)
CO2: 23 mmol/L (ref 19–32)
Calcium: 8.1 mg/dL — ABNORMAL LOW (ref 8.4–10.5)
Chloride: 95 mmol/L — ABNORMAL LOW (ref 96–112)
Creatinine, Ser: 9.01 mg/dL — ABNORMAL HIGH (ref 0.50–1.10)
GFR calc Af Amer: 5 mL/min — ABNORMAL LOW (ref >90)
GFR calc non Af Amer: 4 mL/min — ABNORMAL LOW (ref >90)
Glucose, Bld: 134 mg/dL — ABNORMAL HIGH (ref 70–99)
Potassium: 4.7 mmol/L (ref 3.5–5.1)
Sodium: 127 mmol/L — ABNORMAL LOW (ref 135–145)

## 2014-09-09 MED ORDER — ALTEPLASE 2 MG IJ SOLR
2.0000 mg | Freq: Once | INTRAMUSCULAR | Status: DC | PRN
  Filled 2014-09-09: qty 2

## 2014-09-09 MED ORDER — PENTAFLUOROPROP-TETRAFLUOROETH EX AERO: 1.0000 "application " | INHALATION_SPRAY | CUTANEOUS | Status: DC | PRN

## 2014-09-09 MED ORDER — NEPRO/CARBSTEADY PO LIQD
237.0000 mL | ORAL | Status: DC | PRN
  Filled 2014-09-09: qty 237

## 2014-09-09 MED ORDER — LIDOCAINE HCL (PF) 1 % IJ SOLN: 5.0000 mL | INTRAMUSCULAR | Status: DC | PRN

## 2014-09-09 MED ORDER — SODIUM CHLORIDE 0.9 % IV SOLN: 100.0000 mL | INTRAVENOUS | Status: DC | PRN

## 2014-09-09 MED ORDER — HEPARIN SODIUM (PORCINE) 1000 UNIT/ML DIALYSIS: 1000.0000 [IU] | INTRAMUSCULAR | Status: DC | PRN

## 2014-09-09 MED ORDER — LIDOCAINE-PRILOCAINE 2.5-2.5 % EX CREA
1.0000 "application " | TOPICAL_CREAM | CUTANEOUS | Status: DC | PRN
  Filled 2014-09-09: qty 5

## 2014-09-09 NOTE — Progress Notes (Signed)
[facility] PROGRESS NOTE  Brandy Houston [id number] DOB: Feb 21, 1956 DOA: 08/07/2014 PCP: Chari Manning, NP Interim summary: 59 year old lady with multiple medical problems initially admitted for left shoulder pain, further work up revealed MSSA bacteremia with septic arthritis of the numerous joints.  She underwent arthroscopic irrigation and debridement of the septic arthritis.  TEE is negative for endocarditis.  She was started on ancef by ID and recommended 8 weeks of treatment. Meanwhile her renal function worsened and renal consulted.  She was started on HD sessions.   Assessment/Plan: 1.  MSSA BACTEREMIA with septic arthritis: -Bilateral knee and shoulder septic arthritis seen by ID and Orthopedics Dr [name], she underwent irrigation and debridement of Right knee, left knee, Right AC & left AC joints, and Irrigation and debridement of right hand flexor tendon sheath. -Currently on cefazolin to continue a total of 8 weeks of treatment, 2/26 as day 1 of 56 days -Underwent TTE on 2/24 & TEE on 2/29 that was negative -She again underwent R knee arthroscopic irrigation and debridement on 3/5 by Dr [name].  -Pain control.  -ID and orthopedics are available as needed.  -ambulate, PT  2. UTI -She had episode of fever on 3/19, possibly from UTI, urine cultures showed pseudomonas sensitive to ciprofloxacin -continue ciprofloxacin day 5/5, stop after todays dose  3. 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  -started on Temporary HD via R tunned IJ dialysis catheter, last HD 3/19 -urine output poor 266ml yetserday -Renal following, on high dose IV lasix -some symptoms of uremia and volume overloaded-significantly -plan for HD per Renal, will need to start CLIP process  4. Diabetes mellitus; -stable -SSI  5. Hepatitis C antibody positive: -FU with ID as outpatient.   6. Anemia; Anemia due to chronic  disease and blood loss, acute illness -epo as per renal. She received a dose of ferra heme  -Transfuse to keep hemoglobin greater than 7. Last hemoglobin is 8.1  -stable, monitor  7. Severe malnutrition -likely due to severe sepsis and third spacing -Severe hypoalbuminemia, Nutrition supplementation.   8. Hypertension:  well controlled.  9.Urinary retention: -stopped oxybutynin, continue foley  DVT proph: Hep Sq  Code Status: full code.  Family Communication: none at bedside Disposition Plan: pending, plan for SNF when her renal function improves.    Consultants:  Renal  ID  Orthopedics.     Procedures:  HD   Arthroscopic irrigation and debridement of septic arthritis of left knee on 3/5   Arthroscopic extensive synovectomy in all 3 compartments of the left knee ON 3/5  Renal biopsy done on march 4 th 2016 and was found to have C3  Acute diffuse proliferative glomerulonephritis.   Antibiotics:  Ancef and levaquin  HPI/Subjective: Some anorexia, nausea, and vomiting   Objective: Filed Vitals:   09/09/14 1330  BP: 157/82  Pulse: 84  Temp:   Resp:     Intake/Output Summary (Last 24 hours) at 09/09/14 1416 Last data filed at 09/09/14 0442  Gross per 24 hour  Intake    324 ml  Output    250 ml  Net     74 ml   Filed Weights   09/07/14 2052 09/08/14 2025 09/09/14 1135  Weight: 99.428 kg (219 lb 3.2 oz) 102.195 kg (225 lb 4.8 oz) 100 kg (220 lb 7.4 oz)    Exam:   General:  Alert afebrile comfortable, chronically ill appearing, neck with central  line  Cardiovascular: s1s2, RRR   Respiratory: bibasilar faint crackles  Abdomen: soft non tender non distended bowel sounds heard  Musculoskeletal: leg edema 3plus, L knee with swelling and insicion c/d/i  Data Reviewed: Basic Metabolic Panel:  Recent Labs Lab 09/05/14 0530 09/06/14 0700 09/07/14 1050 09/08/14 0407 09/09/14 1227  NA 130* 127* 129* 126* 127*  K 4.3 4.3 4.6 4.6 4.7  CL 95*  95* 94* 95* 95*  CO2 27 28 25 24 23   GLUCOSE 123* 119* 135* 111* 134*  BUN 20 23 28* 31* 35*  CREATININE 6.56* 7.50* 8.17* 8.73* 9.01*  CALCIUM 8.0* 7.9* 8.2* 8.0* 8.1*  PHOS  --   --  4.8*  --   --    Liver Function Tests:  Recent Labs Lab 09/07/14 1050  ALBUMIN 1.4*   No results for input(s): LIPASE, AMYLASE in the last 168 hours. No results for input(s): AMMONIA in the last 168 hours. CBC:  Recent Labs Lab 09/04/14 0500 09/06/14 0700 09/07/14 1050 09/08/14 0407 09/09/14 1228  WBC 7.7 6.3 6.9 6.8 7.5  HGB 8.1* 7.6* 7.7* 7.9* 8.5*  HCT 26.3* 24.6* 24.6* 25.4* 27.2*  MCV 96.7 94.6 95.0 93.7 93.8  PLT 340 299 304 350 397   Cardiac Enzymes: No results for input(s): CKTOTAL, CKMB, CKMBINDEX, TROPONINI in the last 168 hours. BNP (last 3 results) No results for input(s): BNP in the last 8760 hours.  ProBNP (last 3 results) No results for input(s): PROBNP in the last 8760 hours.  CBG:  Recent Labs Lab 09/08/14 0748 09/08/14 1203 09/08/14 1627 09/08/14 2022 09/09/14 0748  GLUCAP 114* 146* 185* 130* 125*    Recent Results (from the past 240 hour(s))  Culture, Urine     Status: None   Collection Time: 09/02/14  3:04 PM  Result Value Ref Range Status   Specimen Description URINE, CATHETERIZED  Final   Special Requests NONE  Final   Colony Count   Final    >=100,000 COLONIES/ML Performed at [facility]    Culture   Final    PSEUDOMONAS AERUGINOSA Performed at [facility]    Report Status 09/05/2014 FINAL  Final   Organism ID, Bacteria PSEUDOMONAS AERUGINOSA  Final      Susceptibility   Pseudomonas aeruginosa - MIC*    CEFEPIME 2 SENSITIVE Sensitive     CEFTAZIDIME 4 SENSITIVE Sensitive     CIPROFLOXACIN 0.5 SENSITIVE Sensitive     GENTAMICIN <=1 SENSITIVE Sensitive     IMIPENEM >=16 RESISTANT Resistant     PIP/TAZO 8 SENSITIVE Sensitive     TOBRAMYCIN <=1 SENSITIVE Sensitive     * PSEUDOMONAS AERUGINOSA     Studies: No results  found.  Scheduled Meds: . antiseptic oral rinse  7 mL Mouth Rinse q12n4p  . aspirin  81 mg Oral Daily  .  ceFAZolin (ANCEF) IV  1 g Intravenous Q24H  . chlorhexidine  15 mL Mouth Rinse BID  . ciprofloxacin  500 mg Oral Q24H  . cyclobenzaprine  5 mg Oral BID  . darbepoetin (ARANESP) injection - NON-DIALYSIS  100 mcg Subcutaneous Q Fri-1800  . famotidine  20 mg Oral Daily  . furosemide  120 mg Intravenous 3 times per day  . heparin subcutaneous  5,000 Units Subcutaneous 3 times per day  . hydrALAZINE  25 mg Oral TID  . insulin aspart  0-9 Units Subcutaneous TID WC  . lanthanum  1,000 mg Oral TID WC  . metoprolol tartrate  75 mg Oral  BID  . nicotine  21 mg Transdermal Daily  . polyethylene glycol  17 g Oral BID  . sodium chloride  10-40 mL Intracatheter Q12H  . sodium chloride  10-40 mL Intracatheter Q12H  . sodium chloride  3 mL Intravenous Q12H   Continuous Infusions: . sodium chloride 10 mL/hr at 09/05/14 1857    Principal Problem:   Staphylococcus aureus bacteremia Active Problems:   Septic joint of left shoulder region   Sinus tachycardia   Septic joint of right knee joint   CKD (chronic kidney disease) stage 3, GFR 30-59 ml/min   Septic joint of right hand   SIRS (systemic inflammatory response syndrome)   Benign essential HTN   Hepatitis C   Acute renal failure   Acute renal failure syndrome   Hep C w/ coma, chronic   Joint infection    Time spent: 25 minutes.     Domenic Polite  [facility] Pager [phone] If 7PM-7AM, please contact night-coverage at [email or web address], password [facility] 09/09/2014, 2:16 PM  LOS: 33 days

## 2014-09-09 NOTE — Progress Notes (Signed)
Post HD tx pt alert, c/o of neuropathy pain in legs. Report called to Primary RN. Pt vss and returned safely to room.

## 2014-09-09 NOTE — Progress Notes (Signed)
[facility] ROUNDING NOTE   Subjective:   Interval History:  Continues to not diurese well and has edema Weight is increased. I think that dialysis is going to be needed  No signs of stabilizing in terms of creatinine yet !  Objective:  Vital signs in last 24 hours:  Temp:  [98 F (36.7 C)-98.5 F (36.9 C)] 98.5 F (36.9 C) (03/26 0755) Pulse Rate:  [73-81] 73 (03/26 0755) Resp:  [16-18] 18 (03/26 0755) BP: (96-150)/(67-84) 142/70 mmHg (03/26 0755) SpO2:  [93 %-100 %] 97 % (03/26 0755) Weight:  [102.195 kg (225 lb 4.8 oz)] 102.195 kg (225 lb 4.8 oz) (03/25 2025)  Weight change: 2.767 kg (6 lb 1.6 oz) Filed Weights   09/06/14 2100 09/07/14 2052 09/08/14 2025  Weight: 99.2 kg (218 lb 11.1 oz) 99.428 kg (219 lb 3.2 oz) 102.195 kg (225 lb 4.8 oz)    Intake/Output: I/O last 3 completed shifts: In: 1064 [P.O.:720; I.V.:20; IV Piggyback:324] Out: 525 [Urine:525]   Intake/Output this shift:     CVS- RRR RS- CTA ABD- BS present soft non-distended EXT- 2 + edema   Basic Metabolic Panel:  Recent Labs Lab 09/04/14 1404 09/05/14 0530 09/06/14 0700 09/07/14 1050 09/08/14 0407  NA 131* 130* 127* 129* 126*  K 4.1 4.3 4.3 4.6 4.6  CL 95* 95* 95* 94* 95*  CO2 29 27 28 25 24   GLUCOSE 110* 123* 119* 135* 111*  BUN 15 20 23  28* 31*  CREATININE 5.65* 6.56* 7.50* 8.17* 8.73*  CALCIUM 8.2* 8.0* 7.9* 8.2* 8.0*  PHOS  --   --   --  4.8*  --     Liver Function Tests:  Recent Labs Lab 09/07/14 1050  ALBUMIN 1.4*   No results for input(s): LIPASE, AMYLASE in the last 168 hours. No results for input(s): AMMONIA in the last 168 hours.  CBC:  Recent Labs Lab 09/04/14 0500 09/06/14 0700 09/07/14 1050 09/08/14 0407  WBC 7.7 6.3 6.9 6.8  HGB 8.1* 7.6* 7.7* 7.9*  HCT 26.3* 24.6* 24.6* 25.4*  MCV 96.7 94.6 95.0 93.7  PLT 340 299 304 350    Cardiac Enzymes: No results for input(s): CKTOTAL, CKMB, CKMBINDEX, TROPONINI in the last 168  hours.  BNP: Invalid input(s): POCBNP  CBG:  Recent Labs Lab 09/08/14 0748 09/08/14 1203 09/08/14 1627 09/08/14 2022 09/09/14 0748  GLUCAP 114* 146* 185* 130* 125*    Microbiology: Results for orders placed or performed during the hospital encounter of 08/07/14  Culture, blood (routine x 2)     Status: None   Collection Time: 08/09/14 12:15 PM  Result Value Ref Range Status   Specimen Description BLOOD RIGHT HAND  Final   Special Requests   Final    BOTTLES DRAWN AEROBIC AND ANAEROBIC 7CCS BOTH BOTTLES   Culture   Final    STAPHYLOCOCCUS AUREUS Note: RIFAMPIN AND GENTAMICIN SHOULD NOT BE USED AS SINGLE DRUGS FOR TREATMENT OF STAPH INFECTIONS. Note: Gram Stain Report Called to,Read Back By and Verified With: Shirlean Kelly 700AM 08/10/14 [place] Performed at [facility]    Report Status 08/12/2014 FINAL  Final   Organism ID, Bacteria STAPHYLOCOCCUS AUREUS  Final      Susceptibility   Staphylococcus aureus - MIC*    CLINDAMYCIN <=0.25 SENSITIVE Sensitive     ERYTHROMYCIN <=0.25 SENSITIVE Sensitive     GENTAMICIN <=0.5 SENSITIVE Sensitive     LEVOFLOXACIN 0.25 SENSITIVE Sensitive     OXACILLIN <=0.25 SENSITIVE Sensitive  PENICILLIN >=0.5 RESISTANT Resistant     RIFAMPIN <=0.5 SENSITIVE Sensitive     TRIMETH/SULFA <=10 SENSITIVE Sensitive     VANCOMYCIN 1 SENSITIVE Sensitive     TETRACYCLINE <=1 SENSITIVE Sensitive     MOXIFLOXACIN <=0.25 SENSITIVE Sensitive     * STAPHYLOCOCCUS AUREUS  Culture, blood (routine x 2)     Status: None   Collection Time: 08/09/14 12:25 PM  Result Value Ref Range Status   Specimen Description BLOOD RIGHT ARM  Final   Special Requests   Final    BOTTLES DRAWN AEROBIC AND ANAEROBIC 10CC BOTH BOTTLES   Culture   Final    STAPHYLOCOCCUS AUREUS Note: SUSCEPTIBILITIES PERFORMED ON PREVIOUS CULTURE WITHIN THE LAST 5 DAYS. Note: Gram Stain Report Called to,Read Back By and Verified With: Shirlean Kelly 700AM 08/10/14 [place] Performed at [facility]    Report Status 08/12/2014 FINAL  Final  Culture, routine-abscess     Status: None   Collection Time: 08/10/14 11:54 AM  Result Value Ref Range Status   Specimen Description SHOULDER LEFT  Final   Special Requests NONE  Final   Gram Stain   Final    ABUNDANT WBC PRESENT, PREDOMINANTLY PMN NO SQUAMOUS EPITHELIAL CELLS SEEN MODERATE GRAM POSITIVE COCCI IN PAIRS IN CLUSTERS Performed at [facility]    Culture   Final    MODERATE STAPHYLOCOCCUS AUREUS Note: RIFAMPIN AND GENTAMICIN SHOULD NOT BE USED AS SINGLE DRUGS FOR TREATMENT OF STAPH INFECTIONS. Performed at [facility]    Report Status 08/13/2014 FINAL  Final   Organism ID, Bacteria STAPHYLOCOCCUS AUREUS  Final      Susceptibility   Staphylococcus aureus - MIC*    CLINDAMYCIN <=0.25 SENSITIVE Sensitive     ERYTHROMYCIN <=0.25 SENSITIVE Sensitive     GENTAMICIN <=0.5 SENSITIVE Sensitive     LEVOFLOXACIN 0.25 SENSITIVE Sensitive     OXACILLIN 0.5 SENSITIVE Sensitive     PENICILLIN >=0.5 RESISTANT Resistant     RIFAMPIN <=0.5 SENSITIVE Sensitive     TRIMETH/SULFA <=10 SENSITIVE Sensitive     VANCOMYCIN 1 SENSITIVE Sensitive     TETRACYCLINE <=1 SENSITIVE Sensitive     MOXIFLOXACIN <=0.25 SENSITIVE Sensitive     * MODERATE STAPHYLOCOCCUS AUREUS  Anaerobic culture     Status: None   Collection Time: 08/10/14 11:54 AM  Result Value Ref Range Status   Specimen Description SHOULDER LEFT  Final   Special Requests NONE  Final   Gram Stain   Final    MODERATE WBC PRESENT, PREDOMINANTLY PMN NO SQUAMOUS EPITHELIAL CELLS SEEN MODERATE GRAM POSITIVE COCCI IN PAIRS IN CLUSTERS Performed at [facility]    Culture   Final    NO ANAEROBES ISOLATED Note: NO PROPIONIBACTERIUM ISOLATED Performed at [facility]    Report Status 08/25/2014 FINAL  Final  Body fluid culture     Status: None   Collection Time: 08/10/14  5:22 PM  Result Value Ref Range Status    Specimen Description SYNOVIAL JOINT RIGHT KNEE  Final   Special Requests Normal  Final   Gram Stain   Final    ABUNDANT WBC PRESENT,BOTH PMN AND MONONUCLEAR NO ORGANISMS SEEN Performed at [facility]    Culture   Final    FEW STAPHYLOCOCCUS AUREUS Note: RIFAMPIN AND GENTAMICIN SHOULD NOT BE USED AS SINGLE DRUGS FOR TREATMENT OF STAPH INFECTIONS. CRITICAL RESULT CALLED TO, READ BACK BY AND VERIFIED WITH: CINDY FLORES RN 08/13/14 AT 1130 AM BY  [facility] Performed at [facility]    Report Status 08/14/2014 FINAL  Final   Organism ID, Bacteria STAPHYLOCOCCUS AUREUS  Final      Susceptibility   Staphylococcus aureus - MIC*    CLINDAMYCIN <=0.25 SENSITIVE Sensitive     ERYTHROMYCIN <=0.25 SENSITIVE Sensitive     GENTAMICIN <=0.5 SENSITIVE Sensitive     LEVOFLOXACIN 0.25 SENSITIVE Sensitive     OXACILLIN 0.5 SENSITIVE Sensitive     PENICILLIN >=0.5 RESISTANT Resistant     RIFAMPIN <=0.5 SENSITIVE Sensitive     TRIMETH/SULFA <=10 SENSITIVE Sensitive     VANCOMYCIN <=0.5 SENSITIVE Sensitive     TETRACYCLINE <=1 SENSITIVE Sensitive     MOXIFLOXACIN <=0.25 SENSITIVE Sensitive     * FEW STAPHYLOCOCCUS AUREUS  Surgical pcr screen     Status: Abnormal   Collection Time: 08/10/14  6:42 PM  Result Value Ref Range Status   MRSA, PCR NEGATIVE NEGATIVE Final   Staphylococcus aureus POSITIVE (A) NEGATIVE Final    Comment:        The Xpert SA Assay (FDA approved for NASAL specimens in patients over 77 years of age), is one component of a comprehensive surveillance program.  Test performance has been validated by [facility] for patients greater than or equal to 70 year old. It is not intended to diagnose infection nor to guide or monitor treatment.   Body fluid culture     Status: None   Collection Time: 08/10/14  9:14 PM  Result Value Ref Range Status   Specimen Description FLUID LEFT AC JOINT  Final   Special Requests VANCO, ROCEPHIN  Final   Gram Stain   Final     FEW WBC PRESENT, PREDOMINANTLY PMN MODERATE GRAM POSITIVE COCCI IN PAIRS IN CLUSTERS Gram Stain Report Called to,Read Back By and Verified With: Gram Stain Report Called to,Read Back By and Verified With: Laurance Flatten RN 08/11/13 9:10AM BY [place] Performed at [facility]    Culture   Final    ABUNDANT STAPHYLOCOCCUS AUREUS Note: RIFAMPIN AND GENTAMICIN SHOULD NOT BE USED AS SINGLE DRUGS FOR TREATMENT OF STAPH INFECTIONS. Performed at [facility]    Report Status 08/13/2014 FINAL  Final   Organism ID, Bacteria STAPHYLOCOCCUS AUREUS  Final      Susceptibility   Staphylococcus aureus - MIC*    CLINDAMYCIN <=0.25 SENSITIVE Sensitive     ERYTHROMYCIN <=0.25 SENSITIVE Sensitive     GENTAMICIN <=0.5 SENSITIVE Sensitive     LEVOFLOXACIN 0.25 SENSITIVE Sensitive     OXACILLIN <=0.25 SENSITIVE Sensitive     PENICILLIN >=0.5 RESISTANT Resistant     RIFAMPIN <=0.5 SENSITIVE Sensitive     TRIMETH/SULFA <=10 SENSITIVE Sensitive     VANCOMYCIN 1 SENSITIVE Sensitive     TETRACYCLINE <=1 SENSITIVE Sensitive     MOXIFLOXACIN <=0.25 SENSITIVE Sensitive     * ABUNDANT STAPHYLOCOCCUS AUREUS  Anaerobic culture     Status: None   Collection Time: 08/10/14  9:14 PM  Result Value Ref Range Status   Specimen Description FLUID LEFT AC JOINT  Final   Special Requests VANCO, ROCEPHIN  Final   Gram Stain   Final    FEW WBC PRESENT, PREDOMINANTLY PMN MODERATE GRAM POSITIVE COCCI IN PAIRS IN CLUSTERS Performed at [facility]    Culture   Final    NO ANAEROBES ISOLATED Performed at [facility]    Report Status 08/15/2014 FINAL  Final  Culture, blood (routine x 2)  Status: None   Collection Time: 08/11/14  7:00 AM  Result Value Ref Range Status   Specimen Description BLOOD RIGHT ARM  Final   Special Requests BOTTLES DRAWN AEROBIC AND ANAEROBIC 6CC  Final   Culture   Final    NO GROWTH 5 DAYS Performed at [facility]    Report Status 08/17/2014 FINAL   Final  Culture, blood (routine x 2)     Status: None   Collection Time: 08/11/14  7:08 AM  Result Value Ref Range Status   Specimen Description BLOOD LEFT HAND  Final   Special Requests BOTTLES DRAWN AEROBIC AND ANAEROBIC [place]  Final   Culture   Final    NO GROWTH 5 DAYS Performed at [facility]    Report Status 08/17/2014 FINAL  Final  Culture, Urine     Status: None   Collection Time: 08/12/14  5:47 AM  Result Value Ref Range Status   Specimen Description URINE, RANDOM  Final   Special Requests NONE  Final   Colony Count   Final    3,000 COLONIES/ML Performed at [facility]    Culture   Final    INSIGNIFICANT GROWTH Performed at [facility]    Report Status 08/13/2014 FINAL  Final  Culture, blood (routine x 2)     Status: None   Collection Time: 08/15/14  1:15 PM  Result Value Ref Range Status   Specimen Description BLOOD RIGHT HAND  Final   Special Requests BOTTLES DRAWN AEROBIC ONLY 3CC  Final   Culture   Final    NO GROWTH 5 DAYS Performed at [facility]    Report Status 08/21/2014 FINAL  Final  Culture, blood (routine x 2)     Status: None   Collection Time: 08/15/14  3:11 PM  Result Value Ref Range Status   Specimen Description BLOOD RIGHT HAND  Final   Special Requests   Final    BOTTLES DRAWN AEROBIC AND ANAEROBIC BLUE 10CC RED 5CC   Culture   Final    NO GROWTH 5 DAYS Performed at [facility]    Report Status 08/21/2014 FINAL  Final  Culture, Urine     Status: None   Collection Time: 08/17/14  3:14 PM  Result Value Ref Range Status   Specimen Description URINE, CATHETERIZED  Final   Special Requests NONE  Final   Colony Count NO GROWTH Performed at [facility]   Final   Culture NO GROWTH Performed at [facility]   Final   Report Status 08/18/2014 FINAL  Final  Body fluid culture     Status: None   Collection Time: 08/18/14  4:52 PM  Result Value Ref Range Status   Specimen Description  FLUID SYNOVIAL LEFT KNEE  Final   Special Requests Normal  Final   Gram Stain   Final    MODERATE WBC PRESENT,BOTH PMN AND MONONUCLEAR NO ORGANISMS SEEN Performed at [facility]    Culture   Final    NO GROWTH 3 DAYS Performed at [facility]    Report Status 08/22/2014 FINAL  Final  Culture, Urine     Status: None   Collection Time: 09/02/14  3:04 PM  Result Value Ref Range Status   Specimen Description URINE, CATHETERIZED  Final   Special Requests NONE  Final   Colony Count   Final    >=100,000 COLONIES/ML Performed at [employer] [facility]  Final    PSEUDOMONAS AERUGINOSA Performed at [facility]    Report Status 09/05/2014 FINAL  Final   Organism ID, Bacteria PSEUDOMONAS AERUGINOSA  Final      Susceptibility   Pseudomonas aeruginosa - MIC*    CEFEPIME 2 SENSITIVE Sensitive     CEFTAZIDIME 4 SENSITIVE Sensitive     CIPROFLOXACIN 0.5 SENSITIVE Sensitive     GENTAMICIN <=1 SENSITIVE Sensitive     IMIPENEM >=16 RESISTANT Resistant     PIP/TAZO 8 SENSITIVE Sensitive     TOBRAMYCIN <=1 SENSITIVE Sensitive     * PSEUDOMONAS AERUGINOSA    Coagulation Studies: No results for input(s): LABPROT, INR in the last 72 hours.  Urinalysis: No results for input(s): COLORURINE, LABSPEC, PHURINE, GLUCOSEU, HGBUR, BILIRUBINUR, KETONESUR, PROTEINUR, UROBILINOGEN, NITRITE, LEUKOCYTESUR in the last 72 hours.  Invalid input(s): APPERANCEUR    Imaging: No results found.   Medications:   . sodium chloride 10 mL/hr at 09/05/14 1857   . antiseptic oral rinse  7 mL Mouth Rinse q12n4p  . aspirin  81 mg Oral Daily  .  ceFAZolin (ANCEF) IV  1 g Intravenous Q24H  . chlorhexidine  15 mL Mouth Rinse BID  . ciprofloxacin  500 mg Oral Q24H  . cyclobenzaprine  5 mg Oral BID  . darbepoetin (ARANESP) injection - NON-DIALYSIS  100 mcg Subcutaneous Q Fri-1800  . famotidine  20 mg Oral Daily  . furosemide  120 mg Intravenous 3 times per day  . heparin  subcutaneous  5,000 Units Subcutaneous 3 times per day  . hydrALAZINE  25 mg Oral TID  . insulin aspart  0-9 Units Subcutaneous TID WC  . lanthanum  1,000 mg Oral TID WC  . metoprolol tartrate  75 mg Oral BID  . nicotine  21 mg Transdermal Daily  . polyethylene glycol  17 g Oral BID  . sodium chloride  10-40 mL Intracatheter Q12H  . sodium chloride  10-40 mL Intracatheter Q12H  . sodium chloride  3 mL Intravenous Q12H   sodium chloride, sodium chloride, acetaminophen, diphenhydrAMINE, feeding supplement (NEPRO CARB STEADY), gi cocktail, guaiFENesin-dextromethorphan, heparin, heparin, hydrALAZINE, HYDROcodone-acetaminophen, HYDROmorphone (DILAUDID) injection, lidocaine (PF), lidocaine-prilocaine, magic mouthwash w/lidocaine, ondansetron (ZOFRAN) IV, pentafluoroprop-tetrafluoroeth, sodium chloride, sodium chloride, sorbitol, zolpidem  Assessment/ Plan:     year old lady with multiple medical problems initially admitted for left shoulder pain, further work up revealed MSSA bacteremia with septic arthritis of the numerous joints. She underwent arthroscopic irrigation and debridement of the septic arthritis. TEE is negative for endocarditis. She was started on ancef by ID and recommended 8 weeks of treatment. 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  Anemia stable   7's   ESA administered   Septic joints stable  Hep C stable  Diabetes controlled  Some volume overload noted. I shall plan dialysis today. I think she may need to be committed to dialysis at least for the short term and we may have to watch for recovery as an outpatient. She has now been hospitalized for 5  weeks with no recovery. Will start CLIP process         LOS: 69 Danuta Huseman W @TODAY @9 :53 AM

## 2014-09-10 LAB — CBC
HCT: 25 % — ABNORMAL LOW (ref 36.0–46.0)
Hemoglobin: 7.8 g/dL — ABNORMAL LOW (ref 12.0–15.0)
MCH: 29.1 pg (ref 26.0–34.0)
MCHC: 31.2 g/dL (ref 30.0–36.0)
MCV: 93.3 fL (ref 78.0–100.0)
Platelets: 312 10*3/uL (ref 150–400)
RBC: 2.68 MIL/uL — ABNORMAL LOW (ref 3.87–5.11)
RDW: 15.2 % (ref 11.5–15.5)
WBC: 5.6 10*3/uL (ref 4.0–10.5)

## 2014-09-10 LAB — GLUCOSE, CAPILLARY
Glucose-Capillary: 120 mg/dL — ABNORMAL HIGH (ref 70–99)
Glucose-Capillary: 143 mg/dL — ABNORMAL HIGH (ref 70–99)
Glucose-Capillary: 128 mg/dL — ABNORMAL HIGH (ref 70–99)
Glucose-Capillary: 203 mg/dL — ABNORMAL HIGH (ref 70–99)

## 2014-09-10 LAB — BASIC METABOLIC PANEL
Anion gap: 5 (ref 5–15)
BUN: 22 mg/dL (ref 6–23)
CO2: 27 mmol/L (ref 19–32)
Calcium: 7.9 mg/dL — ABNORMAL LOW (ref 8.4–10.5)
Chloride: 98 mmol/L (ref 96–112)
Creatinine, Ser: 7.02 mg/dL — ABNORMAL HIGH (ref 0.50–1.10)
GFR calc Af Amer: 7 mL/min — ABNORMAL LOW (ref >90)
GFR calc non Af Amer: 6 mL/min — ABNORMAL LOW (ref >90)
Glucose, Bld: 108 mg/dL — ABNORMAL HIGH (ref 70–99)
Potassium: 4 mmol/L (ref 3.5–5.1)
Sodium: 130 mmol/L — ABNORMAL LOW (ref 135–145)

## 2014-09-10 NOTE — Progress Notes (Signed)
[facility] PROGRESS NOTE  Brandy Houston [id number] DOB: [birth year] DOA: 08/07/2014 PCP: Chari Manning, NP Interim summary: 59 year old lady with multiple medical problems initially admitted for left shoulder pain, further work up revealed MSSA bacteremia with septic arthritis of the numerous joints.  She underwent arthroscopic irrigation and debridement of the septic arthritis.  TEE is negative for endocarditis.  She was started on ancef by ID and recommended 8 weeks of treatment. Meanwhile her renal function worsened and renal consulted.  She was started on HD sessions.   Assessment/Plan: 1.  MSSA BACTEREMIA with septic arthritis: -Bilateral knee and shoulder septic arthritis seen by ID and Orthopedics Dr [name], she underwent irrigation and debridement of Right knee, left knee, Right AC & left AC joints, and Irrigation and debridement of right hand flexor tendon sheath. -Currently on cefazolin to continue a total of 8 weeks of treatment, 2/26 as day 1 of 56 days -Underwent TTE on 2/24 & TEE on 2/29 that was negative -She again underwent R knee arthroscopic irrigation and debridement on 3/5 by Dr [name].  -Pain control.  -ID and orthopedics are available as needed.  -ambulate, PT  2. UTI -She had episode of fever on 3/19, possibly from UTI, urine cultures showed pseudomonas sensitive to ciprofloxacin -completed ciprofloxacin day 5 on 3/26  3. 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  -started on Temporary HD via R tunned IJ dialysis catheter, last HD 3/19 -urine output poor 261ml yetserday -Renal following, on high dose IV lasix -some symptoms of uremia and volume overloaded-significantly 3/26 -s/p HD again 3/26 will need to start CLIP process  4. Diabetes mellitus; -stable -SSI  5. Hepatitis C antibody positive: -FU with ID as outpatient.   6. Anemia; Anemia due to chronic disease and blood  loss, acute illness -epo as per renal. She received a dose of ferra heme  -Transfuse to keep hemoglobin greater than 7. Last hemoglobin is 8.1  -stable, monitor  7. Severe malnutrition -likely due to severe sepsis and third spacing -Severe hypoalbuminemia, Nutrition supplementation.   8. Hypertension:  well controlled.  9.Urinary retention: -stopped oxybutynin,  -DC foley  DVT proph: Hep Sq  Code Status: full code.  Family Communication: none at bedside Disposition Plan: pending, plan for SNF when her renal function improves.    Consultants:  Renal  ID  Orthopedics.     Procedures:  HD   Arthroscopic irrigation and debridement of septic arthritis of left knee on 3/5   Arthroscopic extensive synovectomy in all 3 compartments of the left knee ON 3/5  Renal biopsy done on march 4 th 2016 and was found to have C3  Acute diffuse proliferative glomerulonephritis.   Antibiotics:  Ancef and levaquin  HPI/Subjective: Feels better, HD again yesterday  Objective: Filed Vitals:   09/10/14 0743  BP: 139/68  Pulse: 76  Temp: 98.7 F (37.1 C)  Resp: 19    Intake/Output Summary (Last 24 hours) at 09/10/14 1031 Last data filed at 09/10/14 0603  Gross per 24 hour  Intake  678.5 ml  Output   1775 ml  Net -1096.5 ml   Filed Weights   09/09/14 1135 09/09/14 1413 09/09/14 2055  Weight: 100 kg (220 lb 7.4 oz) 97.3 kg (214 lb 8.1 oz) 97.3 kg (214 lb 8.1 oz)    Exam:   General:  Alert afebrile comfortable, chronically ill appearing, neck with central line  Cardiovascular: s1s2, RRR  Respiratory: bibasilar faint crackles  Abdomen: soft non tender non distended bowel sounds heard  Musculoskeletal: leg edema 3plus, L knee with swelling and insicion c/d/i  Data Reviewed: Basic Metabolic Panel:  Recent Labs Lab 09/06/14 0700 09/07/14 1050 09/08/14 0407 09/09/14 1227 09/10/14 0430  NA 127* 129* 126* 127* 130*  K 4.3 4.6 4.6 4.7 4.0  CL 95* 94* 95*  95* 98  CO2 28 25 24 23 27   GLUCOSE 119* 135* 111* 134* 108*  BUN 23 28* 31* 35* 22  CREATININE 7.50* 8.17* 8.73* 9.01* 7.02*  CALCIUM 7.9* 8.2* 8.0* 8.1* 7.9*  PHOS  --  4.8*  --   --   --    Liver Function Tests:  Recent Labs Lab 09/07/14 1050  ALBUMIN 1.4*   No results for input(s): LIPASE, AMYLASE in the last 168 hours. No results for input(s): AMMONIA in the last 168 hours. CBC:  Recent Labs Lab 09/06/14 0700 09/07/14 1050 09/08/14 0407 09/09/14 1228 09/10/14 0430  WBC 6.3 6.9 6.8 7.5 5.6  HGB 7.6* 7.7* 7.9* 8.5* 7.8*  HCT 24.6* 24.6* 25.4* 27.2* 25.0*  MCV 94.6 95.0 93.7 93.8 93.3  PLT 299 304 350 397 312   Cardiac Enzymes: No results for input(s): CKTOTAL, CKMB, CKMBINDEX, TROPONINI in the last 168 hours. BNP (last 3 results) No results for input(s): BNP in the last 8760 hours.  ProBNP (last 3 results) No results for input(s): PROBNP in the last 8760 hours.  CBG:  Recent Labs Lab 09/09/14 0748 09/09/14 1441 09/09/14 1709 09/09/14 2054 09/10/14 0738  GLUCAP 125* 125* 191* 129* 120*    Recent Results (from the past 240 hour(s))  Culture, Urine     Status: None   Collection Time: 09/02/14  3:04 PM  Result Value Ref Range Status   Specimen Description URINE, CATHETERIZED  Final   Special Requests NONE  Final   Colony Count   Final    >=100,000 COLONIES/ML Performed at [facility]    Culture   Final    PSEUDOMONAS AERUGINOSA Performed at [facility]    Report Status 09/05/2014 FINAL  Final   Organism ID, Bacteria PSEUDOMONAS AERUGINOSA  Final      Susceptibility   Pseudomonas aeruginosa - MIC*    CEFEPIME 2 SENSITIVE Sensitive     CEFTAZIDIME 4 SENSITIVE Sensitive     CIPROFLOXACIN 0.5 SENSITIVE Sensitive     GENTAMICIN <=1 SENSITIVE Sensitive     IMIPENEM >=16 RESISTANT Resistant     PIP/TAZO 8 SENSITIVE Sensitive     TOBRAMYCIN <=1 SENSITIVE Sensitive     * PSEUDOMONAS AERUGINOSA     Studies: No results  found.  Scheduled Meds: . antiseptic oral rinse  7 mL Mouth Rinse q12n4p  . aspirin  81 mg Oral Daily  .  ceFAZolin (ANCEF) IV  1 g Intravenous Q24H  . chlorhexidine  15 mL Mouth Rinse BID  . ciprofloxacin  500 mg Oral Q24H  . cyclobenzaprine  5 mg Oral BID  . darbepoetin (ARANESP) injection - NON-DIALYSIS  100 mcg Subcutaneous Q Fri-1800  . famotidine  20 mg Oral Daily  . furosemide  120 mg Intravenous 3 times per day  . heparin subcutaneous  5,000 Units Subcutaneous 3 times per day  . hydrALAZINE  25 mg Oral TID  . insulin aspart  0-9 Units Subcutaneous TID WC  . lanthanum  1,000 mg Oral TID WC  . metoprolol tartrate  75 mg Oral BID  . nicotine  21 mg  Transdermal Daily  . polyethylene glycol  17 g Oral BID  . sodium chloride  10-40 mL Intracatheter Q12H  . sodium chloride  10-40 mL Intracatheter Q12H  . sodium chloride  3 mL Intravenous Q12H   Continuous Infusions: . sodium chloride 10 mL/hr at 09/09/14 1824    Principal Problem:   Staphylococcus aureus bacteremia Active Problems:   Septic joint of left shoulder region   Sinus tachycardia   Septic joint of right knee joint   CKD (chronic kidney disease) stage 3, GFR 30-59 ml/min   Septic joint of right hand   SIRS (systemic inflammatory response syndrome)   Benign essential HTN   Hepatitis C   Acute renal failure   Acute renal failure syndrome   Hep C w/ coma, chronic   Joint infection    Time spent: 25 minutes.     Domenic Polite  [facility] Pager [phone] If 7PM-7AM, please contact night-coverage at [email or web address], password [facility] 09/10/2014, 10:31 AM  LOS: 34 days

## 2014-09-10 NOTE — Progress Notes (Signed)
[facility] ROUNDING NOTE   Subjective:   Interval History: appears better this morning  - spirits are pretty good despite her condition and long hospitalization  Objective:  Vital signs in last 24 hours:  Temp:  [98.4 F (36.9 C)-99.2 F (37.3 C)] 98.7 F (37.1 C) (03/27 0743) Pulse Rate:  [76-86] 76 (03/27 0743) Resp:  [16-19] 19 (03/27 0743) BP: (139-165)/(65-89) 139/68 mmHg (03/27 0743) SpO2:  [94 %-98 %] 94 % (03/27 0743) Weight:  [97.3 kg (214 lb 8.1 oz)-100 kg (220 lb 7.4 oz)] 97.3 kg (214 lb 8.1 oz) (03/26 2055)  Weight change: -2.195 kg (-4 lb 13.4 oz) Filed Weights   09/09/14 1135 09/09/14 1413 09/09/14 2055  Weight: 100 kg (220 lb 7.4 oz) 97.3 kg (214 lb 8.1 oz) 97.3 kg (214 lb 8.1 oz)    Intake/Output: I/O last 3 completed shifts: In: 1042.5 [P.O.:240; I.V.:230.5; IV Piggyback:572] Out: 1900 [Urine:400; Other:1500]   Intake/Output this shift:     CVS- RRR RS- CTA ABD- BS present soft non-distended EXT- 2 + edema   Basic Metabolic Panel:  Recent Labs Lab 09/06/14 0700 09/07/14 1050 09/08/14 0407 09/09/14 1227 09/10/14 0430  NA 127* 129* 126* 127* 130*  K 4.3 4.6 4.6 4.7 4.0  CL 95* 94* 95* 95* 98  CO2 28 25 24 23 27   GLUCOSE 119* 135* 111* 134* 108*  BUN 23 28* 31* 35* 22  CREATININE 7.50* 8.17* 8.73* 9.01* 7.02*  CALCIUM 7.9* 8.2* 8.0* 8.1* 7.9*  PHOS  --  4.8*  --   --   --     Liver Function Tests:  Recent Labs Lab 09/07/14 1050  ALBUMIN 1.4*   No results for input(s): LIPASE, AMYLASE in the last 168 hours. No results for input(s): AMMONIA in the last 168 hours.  CBC:  Recent Labs Lab 09/06/14 0700 09/07/14 1050 09/08/14 0407 09/09/14 1228 09/10/14 0430  WBC 6.3 6.9 6.8 7.5 5.6  HGB 7.6* 7.7* 7.9* 8.5* 7.8*  HCT 24.6* 24.6* 25.4* 27.2* 25.0*  MCV 94.6 95.0 93.7 93.8 93.3  PLT 299 304 350 397 312    Cardiac Enzymes: No results for input(s): CKTOTAL, CKMB, CKMBINDEX, TROPONINI in the last 168  hours.  BNP: Invalid input(s): POCBNP  CBG:  Recent Labs Lab 09/09/14 0748 09/09/14 1441 09/09/14 1709 09/09/14 2054 09/10/14 0738  GLUCAP 125* 125* 191* 129* 120*    Microbiology: Results for orders placed or performed during the hospital encounter of 08/07/14  Culture, blood (routine x 2)     Status: None   Collection Time: 08/09/14 12:15 PM  Result Value Ref Range Status   Specimen Description BLOOD RIGHT HAND  Final   Special Requests   Final    BOTTLES DRAWN AEROBIC AND ANAEROBIC 7CCS BOTH BOTTLES   Culture   Final    STAPHYLOCOCCUS AUREUS Note: RIFAMPIN AND GENTAMICIN SHOULD NOT BE USED AS SINGLE DRUGS FOR TREATMENT OF STAPH INFECTIONS. Note: Gram Stain Report Called to,Read Back By and Verified With: Shirlean Kelly 700AM 08/10/14 [place] Performed at [facility]    Report Status 08/12/2014 FINAL  Final   Organism ID, Bacteria STAPHYLOCOCCUS AUREUS  Final      Susceptibility   Staphylococcus aureus - MIC*    CLINDAMYCIN <=0.25 SENSITIVE Sensitive     ERYTHROMYCIN <=0.25 SENSITIVE Sensitive     GENTAMICIN <=0.5 SENSITIVE Sensitive     LEVOFLOXACIN 0.25 SENSITIVE Sensitive     OXACILLIN <=0.25 SENSITIVE Sensitive     PENICILLIN >=0.5  RESISTANT Resistant     RIFAMPIN <=0.5 SENSITIVE Sensitive     TRIMETH/SULFA <=10 SENSITIVE Sensitive     VANCOMYCIN 1 SENSITIVE Sensitive     TETRACYCLINE <=1 SENSITIVE Sensitive     MOXIFLOXACIN <=0.25 SENSITIVE Sensitive     * STAPHYLOCOCCUS AUREUS  Culture, blood (routine x 2)     Status: None   Collection Time: 08/09/14 12:25 PM  Result Value Ref Range Status   Specimen Description BLOOD RIGHT ARM  Final   Special Requests   Final    BOTTLES DRAWN AEROBIC AND ANAEROBIC 10CC BOTH BOTTLES   Culture   Final    STAPHYLOCOCCUS AUREUS Note: SUSCEPTIBILITIES PERFORMED ON PREVIOUS CULTURE WITHIN THE LAST 5 DAYS. Note: Gram Stain Report Called to,Read Back By and Verified With: Shirlean Kelly 700AM 08/10/14 [place] Performed at [facility]    Report Status 08/12/2014 FINAL  Final  Culture, routine-abscess     Status: None   Collection Time: 08/10/14 11:54 AM  Result Value Ref Range Status   Specimen Description SHOULDER LEFT  Final   Special Requests NONE  Final   Gram Stain   Final    ABUNDANT WBC PRESENT, PREDOMINANTLY PMN NO SQUAMOUS EPITHELIAL CELLS SEEN MODERATE GRAM POSITIVE COCCI IN PAIRS IN CLUSTERS Performed at [facility]    Culture   Final    MODERATE STAPHYLOCOCCUS AUREUS Note: RIFAMPIN AND GENTAMICIN SHOULD NOT BE USED AS SINGLE DRUGS FOR TREATMENT OF STAPH INFECTIONS. Performed at [facility]    Report Status 08/13/2014 FINAL  Final   Organism ID, Bacteria STAPHYLOCOCCUS AUREUS  Final      Susceptibility   Staphylococcus aureus - MIC*    CLINDAMYCIN <=0.25 SENSITIVE Sensitive     ERYTHROMYCIN <=0.25 SENSITIVE Sensitive     GENTAMICIN <=0.5 SENSITIVE Sensitive     LEVOFLOXACIN 0.25 SENSITIVE Sensitive     OXACILLIN 0.5 SENSITIVE Sensitive     PENICILLIN >=0.5 RESISTANT Resistant     RIFAMPIN <=0.5 SENSITIVE Sensitive     TRIMETH/SULFA <=10 SENSITIVE Sensitive     VANCOMYCIN 1 SENSITIVE Sensitive     TETRACYCLINE <=1 SENSITIVE Sensitive     MOXIFLOXACIN <=0.25 SENSITIVE Sensitive     * MODERATE STAPHYLOCOCCUS AUREUS  Anaerobic culture     Status: None   Collection Time: 08/10/14 11:54 AM  Result Value Ref Range Status   Specimen Description SHOULDER LEFT  Final   Special Requests NONE  Final   Gram Stain   Final    MODERATE WBC PRESENT, PREDOMINANTLY PMN NO SQUAMOUS EPITHELIAL CELLS SEEN MODERATE GRAM POSITIVE COCCI IN PAIRS IN CLUSTERS Performed at [facility]    Culture   Final    NO ANAEROBES ISOLATED Note: NO PROPIONIBACTERIUM ISOLATED Performed at [facility]    Report Status 08/25/2014 FINAL  Final  Body fluid culture     Status: None   Collection Time: 08/10/14  5:22 PM  Result Value Ref Range Status    Specimen Description SYNOVIAL JOINT RIGHT KNEE  Final   Special Requests Normal  Final   Gram Stain   Final    ABUNDANT WBC PRESENT,BOTH PMN AND MONONUCLEAR NO ORGANISMS SEEN Performed at [facility]    Culture   Final    FEW STAPHYLOCOCCUS AUREUS Note: RIFAMPIN AND GENTAMICIN SHOULD NOT BE USED AS SINGLE DRUGS FOR TREATMENT OF STAPH INFECTIONS. CRITICAL RESULT CALLED TO, READ BACK BY AND VERIFIED WITH: CINDY FLORES RN 08/13/14 AT 81 AM BY [facility] Performed  at [facility]    Report Status 08/14/2014 FINAL  Final   Organism ID, Bacteria STAPHYLOCOCCUS AUREUS  Final      Susceptibility   Staphylococcus aureus - MIC*    CLINDAMYCIN <=0.25 SENSITIVE Sensitive     ERYTHROMYCIN <=0.25 SENSITIVE Sensitive     GENTAMICIN <=0.5 SENSITIVE Sensitive     LEVOFLOXACIN 0.25 SENSITIVE Sensitive     OXACILLIN 0.5 SENSITIVE Sensitive     PENICILLIN >=0.5 RESISTANT Resistant     RIFAMPIN <=0.5 SENSITIVE Sensitive     TRIMETH/SULFA <=10 SENSITIVE Sensitive     VANCOMYCIN <=0.5 SENSITIVE Sensitive     TETRACYCLINE <=1 SENSITIVE Sensitive     MOXIFLOXACIN <=0.25 SENSITIVE Sensitive     * FEW STAPHYLOCOCCUS AUREUS  Surgical pcr screen     Status: Abnormal   Collection Time: 08/10/14  6:42 PM  Result Value Ref Range Status   MRSA, PCR NEGATIVE NEGATIVE Final   Staphylococcus aureus POSITIVE (A) NEGATIVE Final    Comment:        The Xpert SA Assay (FDA approved for NASAL specimens in patients over 53 years of age), is one component of a comprehensive surveillance program.  Test performance has been validated by [facility] for patients greater than or equal to 83 year old. It is not intended to diagnose infection nor to guide or monitor treatment.   Body fluid culture     Status: None   Collection Time: 08/10/14  9:14 PM  Result Value Ref Range Status   Specimen Description FLUID LEFT AC JOINT  Final   Special Requests VANCO, ROCEPHIN  Final   Gram Stain   Final     FEW WBC PRESENT, PREDOMINANTLY PMN MODERATE GRAM POSITIVE COCCI IN PAIRS IN CLUSTERS Gram Stain Report Called to,Read Back By and Verified With: Gram Stain Report Called to,Read Back By and Verified With: Laurance Flatten RN 08/11/13 9:10AM BY [place] Performed at [facility]    Culture   Final    ABUNDANT STAPHYLOCOCCUS AUREUS Note: RIFAMPIN AND GENTAMICIN SHOULD NOT BE USED AS SINGLE DRUGS FOR TREATMENT OF STAPH INFECTIONS. Performed at [facility]    Report Status 08/13/2014 FINAL  Final   Organism ID, Bacteria STAPHYLOCOCCUS AUREUS  Final      Susceptibility   Staphylococcus aureus - MIC*    CLINDAMYCIN <=0.25 SENSITIVE Sensitive     ERYTHROMYCIN <=0.25 SENSITIVE Sensitive     GENTAMICIN <=0.5 SENSITIVE Sensitive     LEVOFLOXACIN 0.25 SENSITIVE Sensitive     OXACILLIN <=0.25 SENSITIVE Sensitive     PENICILLIN >=0.5 RESISTANT Resistant     RIFAMPIN <=0.5 SENSITIVE Sensitive     TRIMETH/SULFA <=10 SENSITIVE Sensitive     VANCOMYCIN 1 SENSITIVE Sensitive     TETRACYCLINE <=1 SENSITIVE Sensitive     MOXIFLOXACIN <=0.25 SENSITIVE Sensitive     * ABUNDANT STAPHYLOCOCCUS AUREUS  Anaerobic culture     Status: None   Collection Time: 08/10/14  9:14 PM  Result Value Ref Range Status   Specimen Description FLUID LEFT AC JOINT  Final   Special Requests VANCO, ROCEPHIN  Final   Gram Stain   Final    FEW WBC PRESENT, PREDOMINANTLY PMN MODERATE GRAM POSITIVE COCCI IN PAIRS IN CLUSTERS Performed at [facility]    Culture   Final    NO ANAEROBES ISOLATED Performed at [facility]    Report Status 08/15/2014 FINAL  Final  Culture, blood (routine x 2)     Status:  None   Collection Time: 08/11/14  7:00 AM  Result Value Ref Range Status   Specimen Description BLOOD RIGHT ARM  Final   Special Requests BOTTLES DRAWN AEROBIC AND ANAEROBIC 6CC  Final   Culture   Final    NO GROWTH 5 DAYS Performed at [facility]    Report Status 08/17/2014 FINAL   Final  Culture, blood (routine x 2)     Status: None   Collection Time: 08/11/14  7:08 AM  Result Value Ref Range Status   Specimen Description BLOOD LEFT HAND  Final   Special Requests BOTTLES DRAWN AEROBIC AND ANAEROBIC [place]  Final   Culture   Final    NO GROWTH 5 DAYS Performed at [facility]    Report Status 08/17/2014 FINAL  Final  Culture, Urine     Status: None   Collection Time: 08/12/14  5:47 AM  Result Value Ref Range Status   Specimen Description URINE, RANDOM  Final   Special Requests NONE  Final   Colony Count   Final    3,000 COLONIES/ML Performed at [facility]    Culture   Final    INSIGNIFICANT GROWTH Performed at [facility]    Report Status 08/13/2014 FINAL  Final  Culture, blood (routine x 2)     Status: None   Collection Time: 08/15/14  1:15 PM  Result Value Ref Range Status   Specimen Description BLOOD RIGHT HAND  Final   Special Requests BOTTLES DRAWN AEROBIC ONLY 3CC  Final   Culture   Final    NO GROWTH 5 DAYS Performed at [facility]    Report Status 08/21/2014 FINAL  Final  Culture, blood (routine x 2)     Status: None   Collection Time: 08/15/14  3:11 PM  Result Value Ref Range Status   Specimen Description BLOOD RIGHT HAND  Final   Special Requests   Final    BOTTLES DRAWN AEROBIC AND ANAEROBIC BLUE 10CC RED 5CC   Culture   Final    NO GROWTH 5 DAYS Performed at [facility]    Report Status 08/21/2014 FINAL  Final  Culture, Urine     Status: None   Collection Time: 08/17/14  3:14 PM  Result Value Ref Range Status   Specimen Description URINE, CATHETERIZED  Final   Special Requests NONE  Final   Colony Count NO GROWTH Performed at [facility]   Final   Culture NO GROWTH Performed at [facility]   Final   Report Status 08/18/2014 FINAL  Final  Body fluid culture     Status: None   Collection Time: 08/18/14  4:52 PM  Result Value Ref Range Status   Specimen Description  FLUID SYNOVIAL LEFT KNEE  Final   Special Requests Normal  Final   Gram Stain   Final    MODERATE WBC PRESENT,BOTH PMN AND MONONUCLEAR NO ORGANISMS SEEN Performed at [facility]    Culture   Final    NO GROWTH 3 DAYS Performed at [facility]    Report Status 08/22/2014 FINAL  Final  Culture, Urine     Status: None   Collection Time: 09/02/14  3:04 PM  Result Value Ref Range Status   Specimen Description URINE, CATHETERIZED  Final   Special Requests NONE  Final   Colony Count   Final    >=100,000 COLONIES/ML Performed at [employer] [facility]  Final    PSEUDOMONAS AERUGINOSA Performed at [facility]    Report Status 09/05/2014 FINAL  Final   Organism ID, Bacteria PSEUDOMONAS AERUGINOSA  Final      Susceptibility   Pseudomonas aeruginosa - MIC*    CEFEPIME 2 SENSITIVE Sensitive     CEFTAZIDIME 4 SENSITIVE Sensitive     CIPROFLOXACIN 0.5 SENSITIVE Sensitive     GENTAMICIN <=1 SENSITIVE Sensitive     IMIPENEM >=16 RESISTANT Resistant     PIP/TAZO 8 SENSITIVE Sensitive     TOBRAMYCIN <=1 SENSITIVE Sensitive     * PSEUDOMONAS AERUGINOSA    Coagulation Studies: No results for input(s): LABPROT, INR in the last 72 hours.  Urinalysis: No results for input(s): COLORURINE, LABSPEC, PHURINE, GLUCOSEU, HGBUR, BILIRUBINUR, KETONESUR, PROTEINUR, UROBILINOGEN, NITRITE, LEUKOCYTESUR in the last 72 hours.  Invalid input(s): APPERANCEUR    Imaging: No results found.   Medications:   . sodium chloride 10 mL/hr at 09/09/14 1824   . antiseptic oral rinse  7 mL Mouth Rinse q12n4p  . aspirin  81 mg Oral Daily  .  ceFAZolin (ANCEF) IV  1 g Intravenous Q24H  . chlorhexidine  15 mL Mouth Rinse BID  . cyclobenzaprine  5 mg Oral BID  . darbepoetin (ARANESP) injection - NON-DIALYSIS  100 mcg Subcutaneous Q Fri-1800  . famotidine  20 mg Oral Daily  . furosemide  120 mg Intravenous 3 times per day  . heparin subcutaneous  5,000 Units Subcutaneous  3 times per day  . hydrALAZINE  25 mg Oral TID  . insulin aspart  0-9 Units Subcutaneous TID WC  . lanthanum  1,000 mg Oral TID WC  . metoprolol tartrate  75 mg Oral BID  . nicotine  21 mg Transdermal Daily  . polyethylene glycol  17 g Oral BID  . sodium chloride  10-40 mL Intracatheter Q12H  . sodium chloride  10-40 mL Intracatheter Q12H  . sodium chloride  3 mL Intravenous Q12H   acetaminophen, diphenhydrAMINE, gi cocktail, guaiFENesin-dextromethorphan, hydrALAZINE, HYDROcodone-acetaminophen, HYDROmorphone (DILAUDID) injection, magic mouthwash w/lidocaine, ondansetron (ZOFRAN) IV, sodium chloride, sodium chloride, sorbitol, zolpidem  Assessment/ Plan:   multiple medical problems initially admitted for left shoulder pain, further work up revealed MSSA bacteremia with septic arthritis of the numerous joints. She underwent arthroscopic irrigation and debridement of the septic arthritis. TEE is negative for endocarditis. She was started on ancef by ID and recommended 8 weeks of treatment. 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 I shall plan dialysis today. I think she may need to be committed to dialysis at least for the short term and we may have to watch for recovery as an outpatient. She has now been hospitalized for 5 weeks with no recovery. Will start CLIP process              LOS: 34 Brandy Houston W @TODAY @10 :35 AM

## 2014-09-10 NOTE — Progress Notes (Signed)
ANTIBIOTIC CONSULT NOTE - FOLLOW UP  Pharmacy Consult for Cefazolin Indication: Septic Arthritis  Allergies  Allergen Reactions  . Compazine [Prochlorperazine] Shortness Of Breath and Swelling    TONGUE SWELLS  . Omnipaque [Iohexol] Hives  . Shellfish-Derived Products Anaphylaxis  . Iodinated Diagnostic Agents Rash    Patient Measurements: Height: 5\' 7"  (170.2 cm) Weight: 214 lb 8.1 oz (97.3 kg) IBW/kg (Calculated) : 61.6  Vital Signs: Temp: 98.7 F (37.1 C) (03/27 0743) Temp Source: Oral (03/27 0743) BP: 139/68 mmHg (03/27 0743) Pulse Rate: 76 (03/27 0743) Intake/Output from previous day: 03/26 0701 - 03/27 0700 In: 718.5 [P.O.:240; I.V.:230.5; IV Piggyback:248] Out: [id number] [Urine:275] Intake/Output from this shift:    Labs:  Recent Labs  09/08/14 0407 09/09/14 1227 09/09/14 1228 09/10/14 0430  WBC 6.8  --  7.5 5.6  HGB 7.9*  --  8.5* 7.8*  PLT 350  --  397 312  CREATININE 8.73* 9.01*  --  7.02*   Estimated Creatinine Clearance: 10.3 mL/min (by C-G formula based on Cr of 7.02). No results for input(s): VANCOTROUGH, VANCOPEAK, VANCORANDOM, GENTTROUGH, GENTPEAK, GENTRANDOM, TOBRATROUGH, TOBRAPEAK, TOBRARND, AMIKACINPEAK, AMIKACINTROU, AMIKACIN in the last 72 hours.   Microbiology: Recent Results (from the past 720 hour(s))  Culture, Urine     Status: None   Collection Time: 08/12/14  5:47 AM  Result Value Ref Range Status   Specimen Description URINE, RANDOM  Final   Special Requests NONE  Final   Colony Count   Final    3,000 COLONIES/ML Performed at [facility]    Culture   Final    INSIGNIFICANT GROWTH Performed at [facility]    Report Status 08/13/2014 FINAL  Final  Culture, blood (routine x 2)     Status: None   Collection Time: 08/15/14  1:15 PM  Result Value Ref Range Status   Specimen Description BLOOD RIGHT HAND  Final   Special Requests BOTTLES DRAWN AEROBIC ONLY 3CC  Final   Culture   Final    NO GROWTH 5 DAYS Performed  at [facility]    Report Status 08/21/2014 FINAL  Final  Culture, blood (routine x 2)     Status: None   Collection Time: 08/15/14  3:11 PM  Result Value Ref Range Status   Specimen Description BLOOD RIGHT HAND  Final   Special Requests   Final    BOTTLES DRAWN AEROBIC AND ANAEROBIC BLUE 10CC RED 5CC   Culture   Final    NO GROWTH 5 DAYS Performed at [facility]    Report Status 08/21/2014 FINAL  Final  Culture, Urine     Status: None   Collection Time: 08/17/14  3:14 PM  Result Value Ref Range Status   Specimen Description URINE, CATHETERIZED  Final   Special Requests NONE  Final   Colony Count NO GROWTH Performed at [facility]   Final   Culture NO GROWTH Performed at [facility]   Final   Report Status 08/18/2014 FINAL  Final  Body fluid culture     Status: None   Collection Time: 08/18/14  4:52 PM  Result Value Ref Range Status   Specimen Description FLUID SYNOVIAL LEFT KNEE  Final   Special Requests Normal  Final   Gram Stain   Final    MODERATE WBC PRESENT,BOTH PMN AND MONONUCLEAR NO ORGANISMS SEEN Performed at [facility]    Culture   Final    NO GROWTH 3 DAYS Performed  at [facility]    Report Status 08/22/2014 FINAL  Final  Culture, Urine     Status: None   Collection Time: 09/02/14  3:04 PM  Result Value Ref Range Status   Specimen Description URINE, CATHETERIZED  Final   Special Requests NONE  Final   Colony Count   Final    >=100,000 COLONIES/ML Performed at [facility]    Culture   Final    PSEUDOMONAS AERUGINOSA Performed at [facility]    Report Status 09/05/2014 FINAL  Final   Organism ID, Bacteria PSEUDOMONAS AERUGINOSA  Final      Susceptibility   Pseudomonas aeruginosa - MIC*    CEFEPIME 2 SENSITIVE Sensitive     CEFTAZIDIME 4 SENSITIVE Sensitive     CIPROFLOXACIN 0.5 SENSITIVE Sensitive     GENTAMICIN <=1 SENSITIVE Sensitive     IMIPENEM >=16 RESISTANT Resistant      PIP/TAZO 8 SENSITIVE Sensitive     TOBRAMYCIN <=1 SENSITIVE Sensitive     * PSEUDOMONAS AERUGINOSA    Anti-infectives    Start     Dose/Rate Route Frequency Ordered Stop   09/05/14 2000  ciprofloxacin (CIPRO) tablet 500 mg     500 mg Oral Every 24 hours 09/05/14 1820     09/04/14 1100  levofloxacin (LEVAQUIN) tablet 250 mg  Status:  Discontinued    Comments:  For UTI   250 mg Oral Daily 09/04/14 1019 09/05/14 1803   09/02/14 1800  ceFAZolin (ANCEF) IVPB 1 g/50 mL premix     1 g 100 mL/hr over 30 Minutes Intravenous Every 24 hours 09/01/14 1457 10/05/14 2359   08/29/14 0900  ceFAZolin (ANCEF) IVPB 2 g/50 mL premix     2 g 100 mL/hr over 30 Minutes Intravenous  Once 08/29/14 0854 08/29/14 0925   08/29/14 0802  ceFAZolin (ANCEF) 2-3 GM-% IVPB SOLR  Status:  Discontinued    Comments:  Laughlin, Amy   : cabinet override      08/29/14 0802 08/29/14 0815   08/28/14 2200  ceFAZolin (ANCEF) IVPB 2 g/50 mL premix  Status:  Discontinued     2 g 100 mL/hr over 30 Minutes Intravenous Every M-W-F (Hemodialysis) 08/28/14 1501 09/01/14 1457   08/28/14 1200  ceFAZolin (ANCEF) IVPB 2 g/50 mL premix  Status:  Discontinued    Comments:  On call to xray 3/14   2 g 100 mL/hr over 30 Minutes Intravenous On call 08/28/14 0924 08/28/14 1446   08/23/14 2200  fluconazole (DIFLUCAN) tablet 150 mg     150 mg Oral  Once 08/23/14 2151 08/23/14 2252   08/19/14 0500  ceFAZolin (ANCEF) IVPB 2 g/50 mL premix  Status:  Discontinued     2 g 100 mL/hr over 30 Minutes Intravenous Every 12 hours 08/18/14 1524 08/28/14 1501   08/18/14 1800  ceFAZolin (ANCEF) IVPB 2 g/50 mL premix  Status:  Discontinued     2 g 100 mL/hr over 30 Minutes Intravenous Every 12 hours 08/18/14 1517 08/18/14 1524   08/18/14 1330  ceFAZolin (ANCEF) 2-3 GM-% IVPB SOLR    Comments:  Barley, Jenny   : cabinet override      08/18/14 1330 08/19/14 0144   08/18/14 0600  ceFAZolin (ANCEF) IVPB 1 g/50 mL premix  Status:  Discontinued     1  g 100 mL/hr over 30 Minutes Intravenous Every 12 hours 08/17/14 1822 08/18/14 1517   08/16/14 1800  ceFAZolin (ANCEF) IVPB 2 g/50 mL premix  Status:  Discontinued     2 g 100 mL/hr over 30 Minutes Intravenous Every 12 hours 08/16/14 1355 08/17/14 1822   08/15/14 1200  nafcillin 2 g in dextrose 5 % 50 mL IVPB  Status:  Discontinued     2 g 100 mL/hr over 30 Minutes Intravenous 6 times per day 08/15/14 1028 08/16/14 1253   08/14/14 1400  ceFAZolin (ANCEF) IVPB 2 g/50 mL premix  Status:  Discontinued     2 g 100 mL/hr over 30 Minutes Intravenous 3 times per day 08/14/14 0850 08/15/14 1028   08/12/14 1800  vancomycin (VANCOCIN) 500 mg in sodium chloride 0.9 % 100 mL IVPB  Status:  Discontinued     500 mg 100 mL/hr over 60 Minutes Intravenous Every 12 hours 08/12/14 0705 08/12/14 0721   08/12/14 1800  vancomycin (VANCOCIN) IVPB 750 mg/150 ml premix  Status:  Discontinued     750 mg 150 mL/hr over 60 Minutes Intravenous Every 12 hours 08/12/14 0721 08/12/14 1315   08/10/14 2138  vancomycin (VANCOCIN) powder  Status:  Discontinued       As needed 08/10/14 2138 08/10/14 2153   08/10/14 1600  cefTRIAXone (ROCEPHIN) 2 g in dextrose 5 % 50 mL IVPB - Premix  Status:  Discontinued     2 g 100 mL/hr over 30 Minutes Intravenous Every 24 hours 08/09/14 1555 08/10/14 1432   08/10/14 1600  ceFAZolin (ANCEF) IVPB 2 g/50 mL premix  Status:  Discontinued     2 g 100 mL/hr over 30 Minutes Intravenous 3 times per day 08/10/14 1432 08/14/14 0845   08/10/14 0600  vancomycin (VANCOCIN) IVPB 750 mg/150 ml premix  Status:  Discontinued     750 mg 150 mL/hr over 60 Minutes Intravenous Every 12 hours 08/09/14 2018 08/12/14 0705   08/09/14 1800  vancomycin (VANCOCIN) 2,000 mg in sodium chloride 0.9 % 500 mL IVPB     2,000 mg 250 mL/hr over 120 Minutes Intravenous  Once 08/09/14 1616 08/09/14 1929   08/09/14 1630  cefTRIAXone (ROCEPHIN) 2 g in dextrose 5 % 50 mL IVPB - Premix     2 g 100 mL/hr over 30 Minutes  Intravenous  Once 08/09/14 1553 08/09/14 1653      59 yo F admitted 08/07/2014  intractable left shoulder pain, s/p work injury 2/18 - MRI  + septic arthritis w/ myositis, s/p I&D 2/25 grew MSSA.  Pharmacy consulted to dose cipro for a pseudomonal UTI  [id number] 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 abx x 8 weeks per ID thru 4/21. I&D- R/L knee/ac joints, R hand. TEE(-). CT concerning for septic tenosynovitis. Rash improving. Afb, WBC wnl  Ancef 2/25>>3/1; 3/2 >> (4/21) Vanc 2/24>>2/27 2/16 VT=23.6 mcg/mL (last dose 2h late) on 750 q12 >no change CTX 2/24>>2/25 Nafcillin 3/1>>3/2 (?cause of AKI) LVQ 3/21>>3/22 Cipro [po] 3/22 >> (3/27)  2/24 BCx-MSSA 2/25 MRSA PCR-positive 2/25 R knee fluid cx-MSSA 2/25 L AC join fluid cx-MSSA 2/25 L shoulder fluid cx-no anaerobes 2/25 L should abscess-MSSA 2/26 BCx-neg 2/27 UC-neg 3/1 BCx-neg 3/3 UC-neg 3/4 L knee fluid cx-neg 3/19 urine: Pseudomonas, pan sens   Plan:  -Ancef 1 g IV q24h (d/t inconsistent HD sessions) -Cipro 500mg  PO q24h, plan for 5d per renal, follow up stop date 2/28  Thank you for allowing pharmacy to be a part of this patients care team.  Rowe Robert Pharm.D., BCPS, AQ-Cardiology Clinical Pharmacist 09/10/2014 9:39 AM Pager: [phone] Phone: [phone]

## 2014-09-11 DIAGNOSIS — N186 End stage renal disease: Secondary | ICD-10-CM

## 2014-09-11 LAB — BASIC METABOLIC PANEL
Anion gap: 6 (ref 5–15)
BUN: 24 mg/dL — ABNORMAL HIGH (ref 6–23)
CO2: 27 mmol/L (ref 19–32)
Calcium: 7.9 mg/dL — ABNORMAL LOW (ref 8.4–10.5)
Chloride: 96 mmol/L (ref 96–112)
Creatinine, Ser: 7.56 mg/dL — ABNORMAL HIGH (ref 0.50–1.10)
GFR calc Af Amer: 6 mL/min — ABNORMAL LOW (ref >90)
GFR calc non Af Amer: 5 mL/min — ABNORMAL LOW (ref >90)
Glucose, Bld: 105 mg/dL — ABNORMAL HIGH (ref 70–99)
Potassium: 4.4 mmol/L (ref 3.5–5.1)
Sodium: 129 mmol/L — ABNORMAL LOW (ref 135–145)

## 2014-09-11 LAB — CBC
HCT: 25 % — ABNORMAL LOW (ref 36.0–46.0)
Hemoglobin: 7.9 g/dL — ABNORMAL LOW (ref 12.0–15.0)
MCH: 29.7 pg (ref 26.0–34.0)
MCHC: 31.6 g/dL (ref 30.0–36.0)
MCV: 94 fL (ref 78.0–100.0)
Platelets: 328 10*3/uL (ref 150–400)
RBC: 2.66 MIL/uL — ABNORMAL LOW (ref 3.87–5.11)
RDW: 15 % (ref 11.5–15.5)
WBC: 6.6 10*3/uL (ref 4.0–10.5)

## 2014-09-11 LAB — GLUCOSE, CAPILLARY
Glucose-Capillary: 120 mg/dL — ABNORMAL HIGH (ref 70–99)
Glucose-Capillary: 133 mg/dL — ABNORMAL HIGH (ref 70–99)
Glucose-Capillary: 107 mg/dL — ABNORMAL HIGH (ref 70–99)
Glucose-Capillary: 138 mg/dL — ABNORMAL HIGH (ref 70–99)

## 2014-09-11 MED ORDER — RENA-VITE PO TABS
1.0000 | ORAL_TABLET | Freq: Every day | ORAL | Status: DC
  Administered 2014-09-11 – 2014-09-13 (×3): 1 via ORAL
  Filled 2014-09-11 (×5): qty 1

## 2014-09-11 NOTE — Clinical Social Work Note (Signed)
CSW continuing to monitor patient's progress and is working on placement at a skilled facility for short-term rehab once ready for discharge. FL-2 updated and sent out to skilled facilities with updated clinicals as patient has been hospitalized since 2/22.    Helayna Dun Givens, MSW, LCSW Licensed Clinical Social Worker [place] [phone]

## 2014-09-11 NOTE — Consult Note (Signed)
Patient name: Brandy Houston MRN: [id number] DOB: 25-Aug-1955 Sex: female   Referred by: Dr. [name]  Reason for referral:  Chief Complaint  Patient presents with  . Shoulder Pain    HISTORY OF PRESENT ILLNESS: Patient is a 59 year old female seen for discussion for permanent access. She has a complicated history. Most recently multiple ointment septic arthritis. She had the worsening renal insufficiency and now is in renal failure. She is currently being dialyzed via a right IJ tunneled catheter. She felt that this is a permanent renal failure and therefore is seen for discussion of permanent access. She has had septic joint in her right hand and forearm was drained with some healing of this. She is halfway through an 8 week course of IV antibiotics. She has no difficulty with her left arm. She is right-handed.  Past Medical History  Diagnosis Date  . Diabetes mellitus   . Hypertension   . Peripheral neuropathy     Past Surgical History  Procedure Laterality Date  . Cholecystectomy    . Appendectomy    . Small intestine surgery      Due to Small Bowel Obstruction  . Tubal ligation    . Knee arthroscopy Right 08/10/2014    Procedure: ARTHROSCOPY I & D KNEE;  Surgeon: Marianna Payment, MD;  Location: [facility];  Service: Orthopedics;  Laterality: Right;  . Shoulder arthroscopy Bilateral 08/10/2014    Procedure: I & D BILATERAL SHOULDERS ;  Surgeon: Marianna Payment, MD;  Location: [facility];  Service: Orthopedics;  Laterality: Bilateral;  . Tenosynovectomy Right 08/11/2014    Procedure: RIGHT WRIST IRRIGATION AND DEBRIDEMENT, TENOSYNOVECTOMY;  Surgeon: Marianna Payment, MD;  Location: [place];  Service: Orthopedics;  Laterality: Right;  . Knee arthroscopy Left 08/11/2014    Procedure: ARTHROSCOPIC WASHOUT LEFT KNEE;  Surgeon: Marianna Payment, MD;  Location: [place];  Service: Orthopedics;  Laterality: Left;  . Tee without cardioversion N/A 08/14/2014    Procedure: TRANSESOPHAGEAL  ECHOCARDIOGRAM (TEE);  Surgeon: Thayer Headings, MD;  Location: [place];  Service: Cardiovascular;  Laterality: N/A;  . Knee arthroscopy Left 08/19/2014    Procedure: ARTHROSCOPIC WASHOUT LEFT KNEE;  Surgeon: Leandrew Koyanagi, MD;  Location: [place];  Service: Orthopedics;  Laterality: Left;    History   Social History  . Marital Status: Single    Spouse Name: N/A  . Number of Children: N/A  . Years of Education: N/A   Occupational History  . Not on file.   Social History Main Topics  . Smoking status: Current Every Day Smoker -- 0.50 packs/day for 20 years  . Smokeless tobacco: Never Used  . Alcohol Use: Yes     Comment: ocassionally  . Drug Use: No  . Sexual Activity: No   Other Topics Concern  . Not on file   Social History Narrative    Family History  Problem Relation Age of Onset  . Cancer Mother   . Heart disease Father   . Cancer Sister   . Cancer Brother   . Diabetes Brother     Allergies as of 08/07/2014 - Review Complete 08/07/2014  Allergen Reaction Noted  . Compazine [prochlorperazine] Shortness Of Breath and Swelling 11/30/2012  . Omnipaque [iohexol] Hives 10/18/2011  . Shellfish-derived products Anaphylaxis 11/04/2013  . Iodinated diagnostic agents Rash 11/04/2013    No current facility-administered medications on file prior to encounter.   Current Outpatient Prescriptions on File Prior to Encounter  Medication Sig  Dispense Refill  . Blood Glucose Monitoring Suppl (ON CALL EXPRESS GLUCOSE METER) DEVI Use as directed 1 Device 0  . glipiZIDE (GLUCOTROL) 10 MG tablet Take 1 tablet (10 mg total) by mouth 2 (two) times daily before a meal. 60 tablet 3  . glucose blood test strip Use as instructed 100 each 12  . hydrALAZINE (APRESOLINE) 25 MG tablet Take 1 tablet (25 mg total) by mouth 3 (three) times daily. 90 tablet 3  . TRUEPLUS LANCETS 30G MISC Use as directed 100 each 2  . aspirin EC 81 MG tablet Take 1 tablet (81 mg total) by mouth daily as needed  for pain. 30 tablet 3     REVIEW OF SYSTEMS:  Reviewed in her history and physical with nothing to add   PHYSICAL EXAMINATION:  General: The patient is a well-nourished female, in no acute distress. Vital signs are BP 145/86 mmHg  Pulse 77  Temp(Src) 97.9 F (36.6 C) (Oral)  Resp 18  Ht 5\' 7"  (1.702 m)  Wt 221 lb 4.8 oz (100.381 kg)  BMI 34.65 kg/m2  SpO2 95%  LMP 03/16/2002 Pulmonary: There is a good air exchange   Musculoskeletal: Dressing intact over right forearm from prior drainage of the septic tendon sheath and healing incision in her right hand Neurologic: No focal weakness or paresthesias are detected, Skin: There are no ulcer or rashes noted. Psychiatric: The patient has normal affect. Cardiovascular: 2+ radial pulses bilaterally Small surface veins in her left arm   Vascular Lab Studies:  Vein mapping pending  Impression and Plan:  End stage renal disease. Discussed options with the patient. Explained the option of left arm AV fistula or AV graft. Is not her right arm candidate due to her recent infection and abscess drainage. Plan for access on Wednesday of this week. Will make final recommendations following her vein mapping.    Rickie Gutierres [facility] Office: [phone]

## 2014-09-11 NOTE — Progress Notes (Signed)
[facility] PROGRESS NOTE  Brandy Houston [id number] DOB: [birth year] DOA: 08/07/2014 PCP: Chari Manning, NP Interim summary: 59 year old lady with multiple medical problems initially admitted for left shoulder pain, further work up revealed MSSA bacteremia with septic arthritis of the numerous joints.  She underwent arthroscopic irrigation and debridement of the septic arthritis.  TEE is negative for endocarditis.  She was started on ancef by ID and recommended 8 weeks of treatment. Meanwhile her renal function worsened and renal consulted.  She was started on HD sessions intermittent from 3/9  Assessment/Plan: 1.  MSSA BACTEREMIA with septic arthritis: -Bilateral knee and shoulder septic arthritis seen by ID and Orthopedics Dr [name], she underwent irrigation and debridement of Right knee, left knee, Right AC & left AC joints, and Irrigation and debridement of right hand flexor tendon sheath. -Currently on cefazolin to continue a total of 8 weeks of treatment, 2/26 as day 1 of 56 days, last day 4/22 -Underwent TTE on 2/24 & TEE on 2/29 that was negative -She again underwent R knee arthroscopic irrigation and debridement on 3/5 by Dr [name] -Pain control, ambulate, PT -ID and orthopedics are available as needed.   2. UTI -She had episode of fever on 3/19, possibly from UTI, urine cultures showed pseudomonas sensitive to ciprofloxacin -completed 5 days of ciprofloxacin on 3/26  3. 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  -started on Temporary HD via R tunned IJ dialysis catheter, started Hd on 3/9 then on 3/14, llast HD 3/19 -urine output continues to be poor -Renal following, was on high dose IV lasix without recovery -some symptoms of uremia and volume overloaded-significantly 3/26 -s/p HD again 3/26 will need to start CLIP process, plan for [facility] consult per Dr.Dunham for AVF  4. Diabetes  mellitus; -stable -SSI  5. Hepatitis C antibody positive: -FU with ID as outpatient.   6. Anemia; -Anemia due to chronic disease and blood loss, acute illness -epo as per renal. She received a dose of ferra heme  -Transfuse to keep hemoglobin greater than 7. Last hemoglobin is 8.1  -stable, monitor  7. Severe malnutrition -likely due to severe sepsis and third spacing -Severe hypoalbuminemia, Nutrition supplementation.   8. Hypertension:  well controlled.  9.Urinary retention: -stopped oxybutynin,  -removed foley  DVT proph: Hep Sq  Code Status: full code.  Family Communication: none at bedside Disposition Plan: pending, plan for SNF when her renal function improves.    Consultants:  Renal  ID  Orthopedics.     Procedures:  HD   Arthroscopic irrigation and debridement of septic arthritis of left knee on 3/5   Arthroscopic extensive synovectomy in all 3 compartments of the left knee ON 3/5  Renal biopsy done on march 4 th 2016 and was found to have C3  Acute diffuse proliferative glomerulonephritis.   Antibiotics:  Ancef and levaquin  HPI/Subjective: Feels better, HD again yesterday  Objective: Filed Vitals:   09/11/14 0940  BP: 145/86  Pulse: 77  Temp:   Resp:     Intake/Output Summary (Last 24 hours) at 09/11/14 1614 Last data filed at 09/11/14 1319  Gross per 24 hour  Intake 1093.01 ml  Output      0 ml  Net 1093.01 ml   Filed Weights   09/09/14 1413 09/09/14 2055 09/10/14 2022  Weight: 97.3 kg (214 lb 8.1 oz) 97.3 kg (214 lb 8.1 oz) 100.381 kg (221 lb 4.8 oz)  Exam:   General:  Alert afebrile comfortable, chronically ill appearing, neck with central line  Cardiovascular: S1S2, RRR   Respiratory: bibasilar faint crackles  Abdomen: soft non tender non distended bowel sounds heard  Musculoskeletal: leg edema 3plus, L knee with swelling and insicion c/d/i  Data Reviewed: Basic Metabolic Panel:  Recent Labs Lab  09/07/14 1050 09/08/14 0407 09/09/14 1227 09/10/14 0430 09/11/14 0510  NA 129* 126* 127* 130* 129*  K 4.6 4.6 4.7 4.0 4.4  CL 94* 95* 95* 98 96  CO2 25 24 23 27 27   GLUCOSE 135* 111* 134* 108* 105*  BUN 28* 31* 35* 22 24*  CREATININE 8.17* 8.73* 9.01* 7.02* 7.56*  CALCIUM 8.2* 8.0* 8.1* 7.9* 7.9*  PHOS 4.8*  --   --   --   --    Liver Function Tests:  Recent Labs Lab 09/07/14 1050  ALBUMIN 1.4*   No results for input(s): LIPASE, AMYLASE in the last 168 hours. No results for input(s): AMMONIA in the last 168 hours. CBC:  Recent Labs Lab 09/07/14 1050 09/08/14 0407 09/09/14 1228 09/10/14 0430 09/11/14 0510  WBC 6.9 6.8 7.5 5.6 6.6  HGB 7.7* 7.9* 8.5* 7.8* 7.9*  HCT 24.6* 25.4* 27.2* 25.0* 25.0*  MCV 95.0 93.7 93.8 93.3 94.0  PLT 304 350 397 312 328   Cardiac Enzymes: No results for input(s): CKTOTAL, CKMB, CKMBINDEX, TROPONINI in the last 168 hours. BNP (last 3 results) No results for input(s): BNP in the last 8760 hours.  ProBNP (last 3 results) No results for input(s): PROBNP in the last 8760 hours.  CBG:  Recent Labs Lab 09/10/14 1209 09/10/14 1656 09/10/14 2140 09/11/14 0752 09/11/14 1228  GLUCAP 143* 128* 203* 120* 133*    Recent Results (from the past 240 hour(s))  Culture, Urine     Status: None   Collection Time: 09/02/14  3:04 PM  Result Value Ref Range Status   Specimen Description URINE, CATHETERIZED  Final   Special Requests NONE  Final   Colony Count   Final    >=100,000 COLONIES/ML Performed at [facility]    Culture   Final    PSEUDOMONAS AERUGINOSA Performed at [facility]    Report Status 09/05/2014 FINAL  Final   Organism ID, Bacteria PSEUDOMONAS AERUGINOSA  Final      Susceptibility   Pseudomonas aeruginosa - MIC*    CEFEPIME 2 SENSITIVE Sensitive     CEFTAZIDIME 4 SENSITIVE Sensitive     CIPROFLOXACIN 0.5 SENSITIVE Sensitive     GENTAMICIN <=1 SENSITIVE Sensitive     IMIPENEM >=16 RESISTANT Resistant      PIP/TAZO 8 SENSITIVE Sensitive     TOBRAMYCIN <=1 SENSITIVE Sensitive     * PSEUDOMONAS AERUGINOSA     Studies: No results found.  Scheduled Meds: . antiseptic oral rinse  7 mL Mouth Rinse q12n4p  . aspirin  81 mg Oral Daily  .  ceFAZolin (ANCEF) IV  1 g Intravenous Q24H  . chlorhexidine  15 mL Mouth Rinse BID  . cyclobenzaprine  5 mg Oral BID  . darbepoetin (ARANESP) injection - NON-DIALYSIS  100 mcg Subcutaneous Q Fri-1800  . famotidine  20 mg Oral Daily  . heparin subcutaneous  5,000 Units Subcutaneous 3 times per day  . hydrALAZINE  25 mg Oral TID  . insulin aspart  0-9 Units Subcutaneous TID WC  . lanthanum  1,000 mg Oral TID WC  . metoprolol tartrate  75 mg Oral BID  . multivitamin  1 tablet Oral QHS  . nicotine  21 mg Transdermal Daily  . polyethylene glycol  17 g Oral BID  . sodium chloride  10-40 mL Intracatheter Q12H  . sodium chloride  10-40 mL Intracatheter Q12H  . sodium chloride  3 mL Intravenous Q12H   Continuous Infusions: . sodium chloride 10 mL/hr at 09/10/14 1813    Principal Problem:   Staphylococcus aureus bacteremia Active Problems:   Septic joint of left shoulder region   Sinus tachycardia   Septic joint of right knee joint   CKD (chronic kidney disease) stage 3, GFR 30-59 ml/min   Septic joint of right hand   SIRS (systemic inflammatory response syndrome)   Benign essential HTN   Hepatitis C   Acute renal failure   Acute renal failure syndrome   Hep C w/ coma, chronic   Joint infection    Time spent: 25 minutes.     Domenic Polite  [facility] Pager [phone] If 7PM-7AM, please contact night-coverage at [email or web address], password [facility] 09/11/2014, 4:14 PM  LOS: 35 days

## 2014-09-11 NOTE — Progress Notes (Signed)
Rehab admissions - I received a prescreen requesting inpatient rehab consideration.  Patient will need to be declared chronic HD, then have permanent access, and then be clipped.  Then we can consider inpatient rehab versus SNF based on available help at home after a potential inpatient rehab stay.  Patient has no insurance at this point.  I have discussed the above with unit case manager.  Call me for questions.  [id number]

## 2014-09-11 NOTE — Progress Notes (Signed)
Physical Therapy Treatment Patient Details Name: Prerna Harold MRN: [id number] DOB: May 09, 1956 Today's Date: 09/11/2014    History of Present Illness MSSA bacteremia with Polyarticular septic arthrits  s/p I&D of BUE's AC joint, BLE knee joint, and right hand    PT Comments    Participating well with therapy and motivated to increase mobility and activity tolerance; Worth re-considering CIR, have placed screen request; Has met bed mobility supine to sit goal, and initial ambulation goal; Other goals continue to be appropriate; Have updated Acute PT goals in the Care Plan/Path section  Follow Up Recommendations  CIR (requested screen to reconsider CIR)     Equipment Recommendations  None recommended by PT    Recommendations for Other Services Rehab consult     Precautions / Restrictions Precautions Precautions: Fall Precaution Comments: Will sit without warning when fatigued; bring chair behind for safety Restrictions RLE Weight Bearing: Weight bearing as tolerated LLE Weight Bearing: Weight bearing as tolerated    Mobility  Bed Mobility Overal bed mobility: Modified Independent Bed Mobility: Supine to Sit     Supine to sit: Modified independent (Device/Increase time)     General bed mobility comments: mod I from supine to sit at EOB with use of bedrail to assist self  Transfers Overall transfer level: Needs assistance Equipment used: Rolling walker (2 wheeled) Transfers: Sit to/from Stand Sit to Stand: Min assist;+2 physical assistance;Mod assist         General transfer comment: Pt. needed +2 min assist to rise to stand from bed; needed mod assist for controlled descent to recliner as pt. tends to sit with little control; noted pt is quite dependent on momentum for successful sit to stand, indicative of weakness  Ambulation/Gait Ambulation/Gait assistance: Min assist Ambulation Distance (Feet): 75 Feet (one seated rest break) Assistive device: Rolling walker (2  wheeled) Gait Pattern/deviations: Step-to pattern;Trunk flexed;Narrow base of support;Ataxic Gait velocity: Decreased   General Gait Details: vc's for posture' min assist for safety and stability due to her unsteadiness and second person closely following with recliner chair   Stairs            Wheelchair Mobility    Modified Rankin (Stroke Patients Only)       Balance Overall balance assessment: Needs assistance         Standing balance support: Bilateral upper extremity supported Standing balance-Leahy Scale: Poor                      Cognition Arousal/Alertness: Awake/alert Behavior During Therapy: WFL for tasks assessed/performed Overall Cognitive Status: Within Functional Limits for tasks assessed                      Exercises General Exercises - Lower Extremity Ankle Circles/Pumps: AROM;Both;10 reps Long Arc Quad: AROM;5 reps (alternating LAQ in prep for getting up)    General Comments        Pertinent Vitals/Pain Pain Assessment: 0-10 Pain Score: 4  Pain Location: shoulders Pain Descriptors / Indicators: Discomfort Pain Intervention(s): Limited activity within patient's tolerance;Monitored during session;Repositioned    Home Living                      Prior Function            PT Goals (current goals can now be found in the care plan section) Acute Rehab PT Goals Patient Stated Goal: go home, feel better PT Goal Formulation: With patient Time For Goal  Achievement: 09/25/14 Potential to Achieve Goals: Good Progress towards PT goals: Goals met and updated - see care plan    Frequency  Min 3X/week    PT Plan Discharge plan needs to be updated;Frequency needs to be updated    Co-evaluation             End of Session Equipment Utilized During Treatment: Gait belt Activity Tolerance: Patient tolerated treatment well;Patient limited by fatigue Patient left: in chair;with call bell/phone within reach      Time: 1044-1103 PT Time Calculation (min) (ACUTE ONLY): 19 min  Charges:  $Gait Training: 8-22 mins                    G Codes:      Quin Hoop 09/11/2014, 12:27 PM  Roney Marion, [place] Pager [phone] Office [phone]

## 2014-09-11 NOTE — Progress Notes (Signed)
[facility] Rounding Note Subjective:  Disgusted that she has been here for so long Legs "hurt" because they are so tight Waiting to here where she will complete her rehab No urine output despite IV lasix Last dialysis was 3/26 but only got short treatment  Objective Vital signs in last 24 hours: Filed Vitals:   09/10/14 2022 09/11/14 0551 09/11/14 0930 09/11/14 0940  BP: 159/76 160/80 149/72 145/86  Pulse: 78 83 78 77  Temp: 99.3 F (37.4 C) 98.6 F (37 C) 97.9 F (36.6 C)   TempSrc: Oral Oral Oral   Resp: _0 Height:      Weight: 100.381 kg (221 lb 4.8 oz)     SpO2: 94% 98% 95%    Weight change: 0.381 kg (13.4 oz)  Intake/Output Summary (Last 24 hours) at 09/11/14 1248 Last data filed at 09/11/14 0931  Gross per 24 hour  Intake 1066.01 ml  Output      0 ml  Net 1066.01 ml   Physical Exam:  BP 145/86 mmHg  Pulse 77  Temp(Src) 97.9 F (36.6 C) (Oral)  Resp 18  Ht _1  (1.702 m)  Wt 100.381 kg (221 lb 4.8 oz)  BMI 34.65 kg/m2  SpO2 95%  LMP 03/16/2002 Very nice older AAF NAD VS as noted Lungs grossly clear Abd soft not tender 2-3+ edema bilat lower extremities Right IJ TDC non-tender (3/15)  Labs: Basic Metabolic Panel:  Recent Labs Lab 09/05/14 0530 09/06/14 0700 09/07/14 1050 09/08/14 0407 09/09/14 1227 09/10/14 0430 09/11/14 0510  NA 130* 127* 129* 126* 127* 130* 129*  K 4.3 4.3 4.6 4.6 4.7 4.0 4.4  CL 95* 95* 94* 95* 95* 98 96  CO2 _2 GLUCOSE 123* 119* 135* 111* 134* 108* 105*  BUN 20 23 28* 31* 35* 22 24*  CREATININE 6.56* 7.50* 8.17* 8.73* 9.01* 7.02* 7.56*  CALCIUM 8.0* 7.9* 8.2* 8.0* 8.1* 7.9* 7.9*  PHOS  --   --  4.8*  --   --   --   --     Recent Labs Lab 09/07/14 1050  ALBUMIN 1.4*    Recent Labs Lab 09/08/14 0407 09/09/14 1228 09/10/14 0430 09/11/14 0510  WBC 6.8 7.5 5.6 6.6  HGB 7.9* 8.5* 7.8* 7.9*  HCT 25.4* 27.2* 25.0* 25.0*  MCV 93.7 93.8 93.3 94.0  PLT 350 397 312 328      Recent Labs Lab 09/10/14 1209 09/10/14 1656 09/10/14 2140 09/11/14 0752 09/11/14 1228  GLUCAP 143* 128* 203* 120* 133*    Medications: . sodium chloride 10 mL/hr at 09/10/14 1813   . antiseptic oral rinse  7 mL Mouth Rinse q12n4p  . aspirin  81 mg Oral Daily  .  ceFAZolin (ANCEF) IV  1 g Intravenous Q24H  . chlorhexidine  15 mL Mouth Rinse BID  . cyclobenzaprine  5 mg Oral BID  . darbepoetin (ARANESP) injection - NON-DIALYSIS  100 mcg Subcutaneous Q Fri-1800  . famotidine  20 mg Oral Daily  . furosemide  120 mg Intravenous 3 times per day  . heparin subcutaneous  5,000 Units Subcutaneous 3 times per day  . hydrALAZINE  25 mg Oral TID  . insulin aspart  0-9 Units Subcutaneous TID WC  . lanthanum  1,000 mg Oral TID WC  . metoprolol tartrate  75 mg Oral BID  . nicotine  21 mg Transdermal Daily  . polyethylene glycol  17 g Oral BID  . sodium  chloride  10-40 mL Intracatheter Q12H  . sodium chloride  10-40 mL Intracatheter Q12H  . 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 Recovery is dependent on treatment of the underlying cause, but she is showing no improvement despite ATB treatment for her infection. Have told her I think we should proceed as if there will NOT be recovery - pursue vascular access and start the CLIP process. She is OK with that. Have called [facility] for AVF. Next HD tomorrow. D/C lasix  Anemia - on Darbe. Will check Fe studies, though I suspect will need to avoid use of IV Fe given infection  Met bone ds - needs repeat phos. Have ordered PTH as well  MSSA bacteremia with septic polyarthritis - to complete 8 weeks of Ancef. Day 1 was 08/11/24. TEE was negative.  Malnutrition -  supplements. Renal vitamin.  Hep C Ab +  DM per primary  HTN good     Jamal Maes, MD [facility] [phone] pager 09/11/2014, 12:48 PM

## 2014-09-11 NOTE — Progress Notes (Signed)
Chaplain visited with Ms. Wittke.   Pt reported being in good spirits and feeling significantly better than when she initially arrived and voiced some matters about being on dialysis. She briefly mentioned her medical issues that she's experienced since admission but is hopeful.   Delford Field, Chaplain 09/11/2014

## 2014-09-12 DIAGNOSIS — M7989 Other specified soft tissue disorders: Secondary | ICD-10-CM

## 2014-09-12 LAB — RENAL FUNCTION PANEL
Albumin: 1.3 g/dL — ABNORMAL LOW (ref 3.5–5.2)
Anion gap: 9 (ref 5–15)
BUN: 27 mg/dL — ABNORMAL HIGH (ref 6–23)
CO2: 25 mmol/L (ref 19–32)
Calcium: 8 mg/dL — ABNORMAL LOW (ref 8.4–10.5)
Chloride: 96 mmol/L (ref 96–112)
Creatinine, Ser: 7.98 mg/dL — ABNORMAL HIGH (ref 0.50–1.10)
GFR calc Af Amer: 6 mL/min — ABNORMAL LOW (ref >90)
GFR calc non Af Amer: 5 mL/min — ABNORMAL LOW (ref >90)
Glucose, Bld: 105 mg/dL — ABNORMAL HIGH (ref 70–99)
Phosphorus: 4.9 mg/dL — ABNORMAL HIGH (ref 2.3–4.6)
Potassium: 4.6 mmol/L (ref 3.5–5.1)
Sodium: 130 mmol/L — ABNORMAL LOW (ref 135–145)

## 2014-09-12 LAB — CBC
HCT: 24.5 % — ABNORMAL LOW (ref 36.0–46.0)
Hemoglobin: 7.8 g/dL — ABNORMAL LOW (ref 12.0–15.0)
MCH: 29.5 pg (ref 26.0–34.0)
MCHC: 31.8 g/dL (ref 30.0–36.0)
MCV: 92.8 fL (ref 78.0–100.0)
Platelets: 349 10*3/uL (ref 150–400)
RBC: 2.64 MIL/uL — ABNORMAL LOW (ref 3.87–5.11)
RDW: 15.1 % (ref 11.5–15.5)
WBC: 5.7 10*3/uL (ref 4.0–10.5)

## 2014-09-12 LAB — GLUCOSE, CAPILLARY
Glucose-Capillary: 118 mg/dL — ABNORMAL HIGH (ref 70–99)
Glucose-Capillary: 146 mg/dL — ABNORMAL HIGH (ref 70–99)
Glucose-Capillary: 131 mg/dL — ABNORMAL HIGH (ref 70–99)

## 2014-09-12 LAB — IRON AND TIBC
Iron: 32 ug/dL — ABNORMAL LOW (ref 42–145)
Saturation Ratios: 18 % — ABNORMAL LOW (ref 20–55)
TIBC: 179 ug/dL — ABNORMAL LOW (ref 250–470)
UIBC: 147 ug/dL (ref 125–400)

## 2014-09-12 LAB — FERRITIN: Ferritin: 1317 ng/mL — ABNORMAL HIGH (ref 10–291)

## 2014-09-12 MED ORDER — DEXTROSE 5 % IV SOLN: 1.5000 g | INTRAVENOUS | Status: DC

## 2014-09-12 MED ORDER — DARBEPOETIN ALFA 200 MCG/0.4ML IJ SOSY: 200.0000 ug | PREFILLED_SYRINGE | INTRAMUSCULAR | Status: DC

## 2014-09-12 MED ORDER — DARBEPOETIN ALFA 100 MCG/0.5ML IJ SOSY
100.0000 ug | PREFILLED_SYRINGE | INTRAMUSCULAR | Status: AC
  Administered 2014-09-12: 100 ug via INTRAVENOUS

## 2014-09-12 MED ORDER — HYDRALAZINE HCL 25 MG PO TABS
25.0000 mg | ORAL_TABLET | Freq: Two times a day (BID) | ORAL | Status: DC
  Administered 2014-09-12 – 2014-09-13 (×4): 25 mg via ORAL
  Filled 2014-09-12 (×6): qty 1

## 2014-09-12 MED ORDER — HYDROMORPHONE HCL 1 MG/ML IJ SOLN
INTRAMUSCULAR | Status: AC
  Administered 2014-09-12: 1 mg via INTRAVENOUS
  Filled 2014-09-12: qty 1

## 2014-09-12 MED ORDER — DARBEPOETIN ALFA 150 MCG/0.3ML IJ SOSY
150.0000 ug | PREFILLED_SYRINGE | INTRAMUSCULAR | Status: DC
Start: 2014-09-12 — End: 2014-09-12

## 2014-09-12 MED ORDER — METOPROLOL TARTRATE 50 MG PO TABS
50.0000 mg | ORAL_TABLET | Freq: Two times a day (BID) | ORAL | Status: DC
  Administered 2014-09-12 – 2014-09-14 (×5): 50 mg via ORAL
  Filled 2014-09-12 (×6): qty 1

## 2014-09-12 MED ORDER — DARBEPOETIN ALFA 100 MCG/0.5ML IJ SOSY
PREFILLED_SYRINGE | INTRAMUSCULAR | Status: AC
  Administered 2014-09-12: 100 ug via INTRAVENOUS
  Filled 2014-09-12: qty 0.5

## 2014-09-12 NOTE — Progress Notes (Signed)
[facility] PROGRESS NOTE  Brandy Houston [id number] DOB: December 12, 1955 DOA: 08/07/2014 PCP: Chari Manning, NP Interim summary: 59 year old lady with multiple medical problems initially admitted for left shoulder pain, further work up revealed MSSA bacteremia with septic arthritis of the numerous joints.  She underwent arthroscopic irrigation and debridement of the septic arthritis.  TEE is negative for endocarditis.  She was started on ancef by ID and recommended 8 weeks of treatment. Meanwhile her renal function worsened and renal consulted.  She was started on HD sessions intermittent from 3/9. Required intermittent HD and now declared ESRD and started CLIP process this week Plan for AVf tomorrow  Assessment/Plan: 1.  MSSA BACTEREMIA with septic arthritis: -Bilateral knee and shoulder septic arthritis she underwent irrigation and debridement of Right knee, left knee, Right AC & left AC joints, and Irrigation and debridement of right hand flexor tendon sheath. -Currently on cefazolin to continue a total of 8 weeks of treatment, 2/26 as day1 and last day 4/22 -Underwent TTE on 2/24 & TEE on 2/29 that was negative -She again underwent R knee arthroscopic irrigation and debridement on 3/5 by Dr [name] -Pain control, ambulate, PT -ID and orthopedics are available as needed.   2. UTI -She had episode of fever on 3/19, possibly from UTI, urine cultures showed pseudomonas sensitive to ciprofloxacin -completed 5 days of ciprofloxacin on 3/26  3. 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  -started on Temporary HD via R tunned IJ dialysis catheter, started Hd on 3/9 then on 3/14, and HD 3/19 -Was then on high dose IV lasix without recovery per Renal -some symptoms of uremia and volume overloaded-significantly 3/26 -s/p HD again 3/26 and 3/29, started CLIP process, plan for AVf per [facility] tomorrow  4. Diabetes  mellitus; -stable -SSI  5. Hepatitis C antibody positive: -FU with ID as outpatient.   6. Anemia; -Anemia due to chronic disease and blood loss, acute illness -epo as per renal. She received a dose of ferra heme  -Transfuse to keep hemoglobin greater than 7 -stable, monitor  7. Severe malnutrition -likely due to severe sepsis and third spacing -Severe hypoalbuminemia, Nutrition supplementation.   8. Hypertension:  well controlled.  9.Urinary retention: resolved -stopped oxybutynin,  -removed foley 3/27  DVT proph: Hep Sq  Code Status: full code.  Family Communication: none at bedside Disposition Plan: pending, plan for SNF, s/p CLIP   Consultants:  Renal  ID  Orthopedics.     Procedures:  HD   Arthroscopic irrigation and debridement of septic arthritis of left knee on 3/5   Arthroscopic extensive synovectomy in all 3 compartments of the left knee ON 3/5  Renal biopsy done on march 4 th 2016 and was found to have C3  Acute diffuse proliferative glomerulonephritis.   Antibiotics:  Ancef   HPI/Subjective: Feels tired, s/p HD today  Objective: Filed Vitals:   09/12/14 1048  BP: 155/88  Pulse: 83  Temp: 98 F (36.7 C)  Resp: 18    Intake/Output Summary (Last 24 hours) at 09/12/14 1308 Last data filed at 09/12/14 1048  Gross per 24 hour  Intake    380 ml  Output   3000 ml  Net  -2620 ml   Filed Weights   09/10/14 2022 09/11/14 2050 09/12/14 0635  Weight: 100.381 kg (221 lb 4.8 oz) 102.195 kg (225 lb 4.8 oz) 99.5 kg (219 lb 5.7 oz)    Exam:  General:  Alert afebrile comfortable, chronically ill appearing, neck with central line  Cardiovascular: S1S2, RRR   Respiratory: bibasilar faint crackles  Abdomen: soft non tender non distended bowel sounds heard  Musculoskeletal: leg edema 2plus, L knee with swelling and insicion c/d/i  Data Reviewed: Basic Metabolic Panel:  Recent Labs Lab 09/07/14 1050 09/08/14 0407 09/09/14 1227  09/10/14 0430 09/11/14 0510 09/12/14 0501  NA 129* 126* 127* 130* 129* 130*  K 4.6 4.6 4.7 4.0 4.4 4.6  CL 94* 95* 95* 98 96 96  CO2 25 24 23 27 27 25   GLUCOSE 135* 111* 134* 108* 105* 105*  BUN 28* 31* 35* 22 24* 27*  CREATININE 8.17* 8.73* 9.01* 7.02* 7.56* 7.98*  CALCIUM 8.2* 8.0* 8.1* 7.9* 7.9* 8.0*  PHOS 4.8*  --   --   --   --  4.9*   Liver Function Tests:  Recent Labs Lab 09/07/14 1050 09/12/14 0501  ALBUMIN 1.4* 1.3*   No results for input(s): LIPASE, AMYLASE in the last 168 hours. No results for input(s): AMMONIA in the last 168 hours. CBC:  Recent Labs Lab 09/08/14 0407 09/09/14 1228 09/10/14 0430 09/11/14 0510 09/12/14 0500  WBC 6.8 7.5 5.6 6.6 5.7  HGB 7.9* 8.5* 7.8* 7.9* 7.8*  HCT 25.4* 27.2* 25.0* 25.0* 24.5*  MCV 93.7 93.8 93.3 94.0 92.8  PLT 350 397 312 328 349   Cardiac Enzymes: No results for input(s): CKTOTAL, CKMB, CKMBINDEX, TROPONINI in the last 168 hours. BNP (last 3 results) No results for input(s): BNP in the last 8760 hours.  ProBNP (last 3 results) No results for input(s): PROBNP in the last 8760 hours.  CBG:  Recent Labs Lab 09/11/14 0752 09/11/14 1228 09/11/14 1705 09/11/14 2144 09/12/14 1139  GLUCAP 120* 133* 107* 138* 118*    Recent Results (from the past 240 hour(s))  Culture, Urine     Status: None   Collection Time: 09/02/14  3:04 PM  Result Value Ref Range Status   Specimen Description URINE, CATHETERIZED  Final   Special Requests NONE  Final   Colony Count   Final    >=100,000 COLONIES/ML Performed at [facility]    Culture   Final    PSEUDOMONAS AERUGINOSA Performed at [facility]    Report Status 09/05/2014 FINAL  Final   Organism ID, Bacteria PSEUDOMONAS AERUGINOSA  Final      Susceptibility   Pseudomonas aeruginosa - MIC*    CEFEPIME 2 SENSITIVE Sensitive     CEFTAZIDIME 4 SENSITIVE Sensitive     CIPROFLOXACIN 0.5 SENSITIVE Sensitive     GENTAMICIN <=1 SENSITIVE Sensitive      IMIPENEM >=16 RESISTANT Resistant     PIP/TAZO 8 SENSITIVE Sensitive     TOBRAMYCIN <=1 SENSITIVE Sensitive     * PSEUDOMONAS AERUGINOSA     Studies: No results found.  Scheduled Meds: . antiseptic oral rinse  7 mL Mouth Rinse q12n4p  . aspirin  81 mg Oral Daily  .  ceFAZolin (ANCEF) IV  1 g Intravenous Q24H  . chlorhexidine  15 mL Mouth Rinse BID  . cyclobenzaprine  5 mg Oral BID  . [START ON 09/19/2014] darbepoetin (ARANESP) injection - DIALYSIS  200 mcg Intravenous Q Tue-HD  . famotidine  20 mg Oral Daily  . heparin subcutaneous  5,000 Units Subcutaneous 3 times per day  . hydrALAZINE  25 mg Oral BID  . insulin aspart  0-9 Units Subcutaneous TID WC  . lanthanum  1,000 mg Oral TID  WC  . metoprolol tartrate  50 mg Oral BID  . multivitamin  1 tablet Oral QHS  . nicotine  21 mg Transdermal Daily  . polyethylene glycol  17 g Oral BID  . sodium chloride  10-40 mL Intracatheter Q12H  . sodium chloride  10-40 mL Intracatheter Q12H  . sodium chloride  3 mL Intravenous Q12H   Continuous Infusions: . sodium chloride 10 mL/hr at 09/10/14 1813    Principal Problem:   Staphylococcus aureus bacteremia Active Problems:   Septic joint of left shoulder region   Sinus tachycardia   Septic joint of right knee joint   CKD (chronic kidney disease) stage 3, GFR 30-59 ml/min   Septic joint of right hand   SIRS (systemic inflammatory response syndrome)   Benign essential HTN   Hepatitis C   Acute renal failure   Acute renal failure syndrome   Hep C w/ coma, chronic   Joint infection    Time spent: 25 minutes.     Domenic Polite  [facility] Pager [phone] If 7PM-7AM, please contact night-coverage at [email or web address], password [facility] 09/12/2014, 1:08 PM  LOS: 36 days

## 2014-09-12 NOTE — Procedures (Signed)
I have personally attended this patient's dialysis session.   Labs are pending Using TDC (BFR 400) 2-3 liter goal  Tight heparin  Jamal Maes, MD [facility] [phone] Pager 09/12/2014, 8:01 AM

## 2014-09-12 NOTE — Progress Notes (Signed)
[facility] Rounding Note Subjective:  Seen in dialysis No new issues CLIP process started For permanent vascular access 3/30  Objective Vital signs in last 24 hours: Filed Vitals:   09/11/14 2050 09/12/14 0534 09/12/14 0635 09/12/14 0643  BP: 161/75 149/77 149/81 148/79  Pulse: 86 71 73 72  Temp: 98.8 F (37.1 C) 97.7 F (36.5 C) 98.2 F (36.8 C)   TempSrc: Oral Oral Oral   Resp: '20 16 18   ' Height:      Weight: 102.195 kg (225 lb 4.8 oz)  99.5 kg (219 lb 5.7 oz)   SpO2: 97% 97%     Weight change: 1.814 kg (4 lb)  Intake/Output Summary (Last 24 hours) at 09/12/14 0738 Last data filed at 09/12/14 0556  Gross per 24 hour  Intake    540 ml  Output      0 ml  Net    540 ml   Physical Exam:  BP 148/79 mmHg  Pulse 72  Temp(Src) 98.2 F (36.8 C) (Oral)  Resp 18  Ht '5\' 7"'  (1.702 m)  Wt 99.5 kg (219 lb 5.7 oz)  BMI 34.35 kg/m2  SpO2 97%  LMP 03/16/2002 Very nice older AAF NAD Seen in the dialysis unit VS as noted Lungs grossly clear anteriorly Abd soft not tender +BS 2-3+ edema bilat lower extremities unchanged from yesterday Right IJ TDC non-tender (3/15) and currently in use  Labs: Basic Metabolic Panel:  Recent Labs Lab 09/06/14 0700 09/07/14 1050 09/08/14 0407 09/09/14 1227 09/10/14 0430 09/11/14 0510 09/12/14 0501  NA 127* 129* 126* 127* 130* 129* 130*  K 4.3 4.6 4.6 4.7 4.0 4.4 4.6  CL 95* 94* 95* 95* 98 96 96  CO2 '28 25 24 23 27 27 25  ' GLUCOSE 119* 135* 111* 134* 108* 105* 105*  BUN 23 28* 31* 35* 22 24* 27*  CREATININE 7.50* 8.17* 8.73* 9.01* 7.02* 7.56* 7.98*  CALCIUM 7.9* 8.2* 8.0* 8.1* 7.9* 7.9* 8.0*  PHOS  --  4.8*  --   --   --   --  4.9*    Recent Labs Lab 09/07/14 1050 09/12/14 0501  ALBUMIN 1.4* 1.3*    Recent Labs Lab 09/09/14 1228 09/10/14 0430 09/11/14 0510 09/12/14 0500  WBC 7.5 5.6 6.6 5.7  HGB 8.5* 7.8* 7.9* 7.8*  HCT 27.2* 25.0* 25.0* 24.5*  MCV 93.8 93.3 94.0 92.8  PLT 397 312 328 349      Recent Labs Lab 09/10/14 2140 09/11/14 0752 09/11/14 1228 09/11/14 1705 09/11/14 2144  GLUCAP 203* 120* 133* 107* 138*    Medications: . sodium chloride 10 mL/hr at 09/10/14 1813   . antiseptic oral rinse  7 mL Mouth Rinse q12n4p  . aspirin  81 mg Oral Daily  .  ceFAZolin (ANCEF) IV  1 g Intravenous Q24H  . chlorhexidine  15 mL Mouth Rinse BID  . cyclobenzaprine  5 mg Oral BID  . darbepoetin (ARANESP) injection - NON-DIALYSIS  100 mcg Subcutaneous Q Fri-1800  . famotidine  20 mg Oral Daily  . heparin subcutaneous  5,000 Units Subcutaneous 3 times per day  . hydrALAZINE  25 mg Oral TID  . insulin aspart  0-9 Units Subcutaneous TID WC  . lanthanum  1,000 mg Oral TID WC  . metoprolol tartrate  75 mg Oral BID  . multivitamin  1 tablet Oral QHS  . nicotine  21 mg Transdermal Daily  . polyethylene glycol  17 g Oral BID  . sodium chloride  10-40  mL Intracatheter Q12H  . sodium chloride  10-40 mL Intracatheter Q12H  . 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 Recovery is dependent on treatment of the underlying cause, but she has shown no improvement despite ATB treatment for her infection. We have deemed her ESRD at this time. HD today and then on a regular schedule with progressive lowering of vol to extablish EDWEDW. CLIP process started. For permanent vascular access on Wed 3/30. Appreciate Dr. Luther Parody assistance  Anemia - on Darbe 100 QFriday - change to 150 QTuesday with HD. Will check Fe studies, though I suspect will need to avoid use of IV Fe given infection  Met bone ds - needs repeat phos. Have ordered PTH as well - both are pending at the time of this note.  MSSA bacteremia with  septic polyarthritis - to complete 8 weeks of Ancef. Day 1 was 08/11/24. TEE was negative. Once ready for discharge will be able to receive Ancef at the outpt HD unit to complete the 8 week course..  Malnutrition - supplements. Renal vitamin.  Hep C Ab +  DM per primary  HTN good. Anticipate need for less medication once volume under better control. Haved reduced hydralazine and BB in that regard to allow for fluid removal with HD  Malnutrition - supplements   Jamal Maes, MD [facility] [phone] pager 09/12/2014, 7:38 AM

## 2014-09-12 NOTE — Progress Notes (Signed)
Right  Upper Extremity Vein Map    Cephalic  Segment Diameter Depth Comment  1. Axilla 1.54mm mm   2. Mid upper arm 1.87mm mm   3. Above AC 1.28mm mm   4. In AC 1.14mm mm   5. Below AC 2.66mm mm   6. Mid forearm 1.33mm mm branch  7. Wrist mm mm    mm mm    mm mm    mm mm    Basilic  Segment Diameter Depth Comment  1. Axilla mm mm   2. Mid upper arm 3.35mm 18.37mm   3. Above AC 4.70mm 18.26mm   4. In [facility] 3.53mm 9.28mm branch  5. Below AC 8.56mm 18.12mm   6. Mid forearm 2.57mm 11.53mm   7. Wrist mm mm    mm mm    mm mm    mm mm      Left Upper Extremity Vein Map    Cephalic  Segment Diameter Depth Comment  1. Axilla 3.18mm mm   2. Mid upper arm 1.40mm mm branch  3. Above AC 0.79mm mm   4. In AC 1.13mm mm   5. Below AC mm mm   6. Mid forearm mm mm   7. Wrist mm mm    mm mm    mm mm    mm mm    Basilic  Segment Diameter Depth Comment  1. Axilla mm mm   2. Mid upper arm 1.39mm 18.42mm   3. Above [facility] 1.15mm 17.65mm   4. In [facility] 1.75mm 44mm   5. Below AC 1.7+mm 50mm   6. Mid forearm mm mm   7. Wrist mm mm    mm mm    mm mm    mm mm     Landry Mellow, RDMS, RVT  09/12/2014, 3:33 PM

## 2014-09-13 ENCOUNTER — Encounter (HOSPITAL_COMMUNITY): Payer: Self-pay | Admitting: Anesthesiology

## 2014-09-13 ENCOUNTER — Inpatient Hospital Stay (HOSPITAL_COMMUNITY): Payer: Medicaid Other | Admitting: Anesthesiology

## 2014-09-13 ENCOUNTER — Encounter (HOSPITAL_COMMUNITY)
Admission: EM | Disposition: A | Discharge status: Rehabilitation Facility | Payer: Self-pay | Source: Home / Self Care | Attending: Internal Medicine

## 2014-09-13 HISTORY — PX: AV FISTULA PLACEMENT: SHX1204

## 2014-09-13 LAB — CBC
HCT: 26 % — ABNORMAL LOW (ref 36.0–46.0)
Hemoglobin: 8 g/dL — ABNORMAL LOW (ref 12.0–15.0)
MCH: 29.1 pg (ref 26.0–34.0)
MCHC: 30.8 g/dL (ref 30.0–36.0)
MCV: 94.5 fL (ref 78.0–100.0)
Platelets: 359 10*3/uL (ref 150–400)
RBC: 2.75 MIL/uL — ABNORMAL LOW (ref 3.87–5.11)
RDW: 15.4 % (ref 11.5–15.5)
WBC: 6.7 10*3/uL (ref 4.0–10.5)

## 2014-09-13 LAB — RENAL FUNCTION PANEL
Albumin: 1.4 g/dL — ABNORMAL LOW (ref 3.5–5.2)
Anion gap: 3 — ABNORMAL LOW (ref 5–15)
BUN: 13 mg/dL (ref 6–23)
CO2: 33 mmol/L — ABNORMAL HIGH (ref 19–32)
Calcium: 7.9 mg/dL — ABNORMAL LOW (ref 8.4–10.5)
Chloride: 99 mmol/L (ref 96–112)
Creatinine, Ser: 5.22 mg/dL — ABNORMAL HIGH (ref 0.50–1.10)
GFR calc Af Amer: 9 mL/min — ABNORMAL LOW (ref >90)
GFR calc non Af Amer: 8 mL/min — ABNORMAL LOW (ref >90)
Glucose, Bld: 125 mg/dL — ABNORMAL HIGH (ref 70–99)
Phosphorus: 3.7 mg/dL (ref 2.3–4.6)
Potassium: 3.9 mmol/L (ref 3.5–5.1)
Sodium: 135 mmol/L (ref 135–145)

## 2014-09-13 LAB — GLUCOSE, CAPILLARY
Glucose-Capillary: 106 mg/dL — ABNORMAL HIGH (ref 70–99)
Glucose-Capillary: 111 mg/dL — ABNORMAL HIGH (ref 70–99)
Glucose-Capillary: 93 mg/dL (ref 70–99)
Glucose-Capillary: 108 mg/dL — ABNORMAL HIGH (ref 70–99)

## 2014-09-13 LAB — BASIC METABOLIC PANEL
Anion gap: 7 (ref 5–15)
BUN: 13 mg/dL (ref 6–23)
CO2: 30 mmol/L (ref 19–32)
Calcium: 8.1 mg/dL — ABNORMAL LOW (ref 8.4–10.5)
Chloride: 99 mmol/L (ref 96–112)
Creatinine, Ser: 5.34 mg/dL — ABNORMAL HIGH (ref 0.50–1.10)
GFR calc Af Amer: 9 mL/min — ABNORMAL LOW (ref >90)
GFR calc non Af Amer: 8 mL/min — ABNORMAL LOW (ref >90)
Glucose, Bld: 127 mg/dL — ABNORMAL HIGH (ref 70–99)
Potassium: 4 mmol/L (ref 3.5–5.1)
Sodium: 136 mmol/L (ref 135–145)

## 2014-09-13 LAB — PARATHYROID HORMONE, INTACT (NO CA): PTH: 47 pg/mL (ref 15–65)

## 2014-09-13 LAB — PTH, INTACT AND CALCIUM
Calcium, Total (PTH): 8.1 mg/dL — ABNORMAL LOW (ref 8.7–10.2)
PTH: 72 pg/mL — ABNORMAL HIGH (ref 15–65)

## 2014-09-13 LAB — SURGICAL PCR SCREEN
MRSA, PCR: NEGATIVE
Staphylococcus aureus: NEGATIVE

## 2014-09-13 SURGERY — ARTERIOVENOUS (AV) FISTULA CREATION
Anesthesia: Monitor Anesthesia Care | Site: Arm Lower | Laterality: Left

## 2014-09-13 MED ORDER — FENTANYL CITRATE 0.05 MG/ML IJ SOLN
INTRAMUSCULAR | Status: AC
  Administered 2014-09-13: 50 ug via INTRAVENOUS
  Filled 2014-09-13: qty 2

## 2014-09-13 MED ORDER — CEFAZOLIN SODIUM-DEXTROSE 2-3 GM-% IV SOLR
INTRAVENOUS | Status: DC | PRN
  Administered 2014-09-13: 2 g via INTRAVENOUS

## 2014-09-13 MED ORDER — 0.9 % SODIUM CHLORIDE (POUR BTL) OPTIME
TOPICAL | Status: DC | PRN
  Administered 2014-09-13: 1000 mL

## 2014-09-13 MED ORDER — FENTANYL CITRATE 0.05 MG/ML IJ SOLN
INTRAMUSCULAR | Status: AC
  Filled 2014-09-13: qty 5

## 2014-09-13 MED ORDER — PROPOFOL 10 MG/ML IV BOLUS
INTRAVENOUS | Status: AC
  Filled 2014-09-13: qty 20

## 2014-09-13 MED ORDER — LIDOCAINE-EPINEPHRINE (PF) 1 %-1:200000 IJ SOLN
INTRAMUSCULAR | Status: AC
  Filled 2014-09-13: qty 10

## 2014-09-13 MED ORDER — SODIUM CHLORIDE 0.9 % IR SOLN
Status: DC | PRN
  Administered 2014-09-13: 12:00:00

## 2014-09-13 MED ORDER — PROPOFOL INFUSION 10 MG/ML OPTIME
INTRAVENOUS | Status: DC | PRN
  Administered 2014-09-13: 75 ug/kg/min via INTRAVENOUS

## 2014-09-13 MED ORDER — FENTANYL CITRATE 0.05 MG/ML IJ SOLN
25.0000 ug | INTRAMUSCULAR | Status: DC | PRN
  Administered 2014-09-13 (×2): 50 ug via INTRAVENOUS

## 2014-09-13 MED ORDER — SODIUM CHLORIDE 0.9 % IV SOLN: 125.0000 mg | INTRAVENOUS | Status: DC

## 2014-09-13 MED ORDER — LIDOCAINE HCL (CARDIAC) 20 MG/ML IV SOLN
INTRAVENOUS | Status: AC
  Filled 2014-09-13: qty 5

## 2014-09-13 MED ORDER — SODIUM CHLORIDE 0.9 % IV SOLN
INTRAVENOUS | Status: DC | PRN
  Administered 2014-09-13: 11:00:00 via INTRAVENOUS

## 2014-09-13 MED ORDER — HEPARIN SODIUM (PORCINE) 5000 UNIT/ML IJ SOLN
5000.0000 [IU] | Freq: Three times a day (TID) | INTRAMUSCULAR | Status: DC
  Administered 2014-09-13 – 2014-09-14 (×2): 5000 [IU] via SUBCUTANEOUS
  Filled 2014-09-13 (×3): qty 1

## 2014-09-13 MED ORDER — ROCURONIUM BROMIDE 50 MG/5ML IV SOLN
INTRAVENOUS | Status: AC
  Filled 2014-09-13: qty 1

## 2014-09-13 MED ORDER — PROPOFOL 10 MG/ML IV BOLUS
INTRAVENOUS | Status: DC | PRN
  Administered 2014-09-13: 50 mg via INTRAVENOUS

## 2014-09-13 MED ORDER — MIDAZOLAM HCL 2 MG/2ML IJ SOLN
INTRAMUSCULAR | Status: AC
  Filled 2014-09-13: qty 2

## 2014-09-13 MED ORDER — FENTANYL CITRATE 0.05 MG/ML IJ SOLN
INTRAMUSCULAR | Status: DC | PRN
  Administered 2014-09-13: 50 ug via INTRAVENOUS
  Administered 2014-09-13 (×2): 25 ug via INTRAVENOUS

## 2014-09-13 MED ORDER — SODIUM CHLORIDE 0.9 % IV SOLN
125.0000 mg | Freq: Once | INTRAVENOUS | Status: AC
  Administered 2014-09-14: 125 mg via INTRAVENOUS
  Filled 2014-09-13 (×2): qty 10

## 2014-09-13 MED ORDER — MEPERIDINE HCL 25 MG/ML IJ SOLN: 6.2500 mg | INTRAMUSCULAR | Status: DC | PRN

## 2014-09-13 MED ORDER — LIDOCAINE-EPINEPHRINE (PF) 1 %-1:200000 IJ SOLN
INTRAMUSCULAR | Status: DC | PRN
  Administered 2014-09-13: 30 mL

## 2014-09-13 MED ORDER — MIDAZOLAM HCL 5 MG/5ML IJ SOLN
INTRAMUSCULAR | Status: DC | PRN
  Administered 2014-09-13 (×2): 1 mg via INTRAVENOUS

## 2014-09-13 MED ORDER — ONDANSETRON HCL 4 MG/2ML IJ SOLN
INTRAMUSCULAR | Status: DC | PRN
  Administered 2014-09-13: 4 mg via INTRAVENOUS

## 2014-09-13 SURGICAL SUPPLY — 31 items
ARMBAND PINK RESTRICT EXTREMIT (MISCELLANEOUS) ×2 IMPLANT
CANISTER SUCTION 2500CC (MISCELLANEOUS) ×2 IMPLANT
CLIP TI MEDIUM 6 (CLIP) ×2 IMPLANT
CLIP TI WIDE RED SMALL 6 (CLIP) ×4 IMPLANT
COVER PROBE W GEL 5X96 (DRAPES) ×2 IMPLANT
DRAIN PENROSE 1/4X12 LTX STRL (WOUND CARE) ×2 IMPLANT
ELECT REM PT RETURN 9FT ADLT (ELECTROSURGICAL) ×2
ELECTRODE REM PT RTRN 9FT ADLT (ELECTROSURGICAL) ×1 IMPLANT
GEL ULTRASOUND 20GR AQUASONIC (MISCELLANEOUS) IMPLANT
GLOVE BIO SURGEON STRL SZ 6.5 (GLOVE) ×4 IMPLANT
GLOVE BIOGEL PI IND STRL 6.5 (GLOVE) ×3 IMPLANT
GLOVE BIOGEL PI IND STRL 7.0 (GLOVE) ×1 IMPLANT
GLOVE BIOGEL PI INDICATOR 6.5 (GLOVE) ×3
GLOVE BIOGEL PI INDICATOR 7.0 (GLOVE) ×1
GLOVE ECLIPSE 6.5 STRL STRAW (GLOVE) ×4 IMPLANT
GLOVE SS BIOGEL STRL SZ 7 (GLOVE) ×1 IMPLANT
GLOVE SUPERSENSE BIOGEL SZ 7 (GLOVE) ×1
GOWN STRL REUS W/ TWL LRG LVL3 (GOWN DISPOSABLE) ×4 IMPLANT
GOWN STRL REUS W/TWL LRG LVL3 (GOWN DISPOSABLE) ×4
GRAFT GORETEX STRT 4-7X45 (Vascular Products) ×2 IMPLANT
KIT BASIN OR (CUSTOM PROCEDURE TRAY) ×2 IMPLANT
KIT ROOM TURNOVER OR (KITS) ×2 IMPLANT
LIQUID BAND (GAUZE/BANDAGES/DRESSINGS) ×2 IMPLANT
NS IRRIG 1000ML POUR BTL (IV SOLUTION) ×2 IMPLANT
PACK CV ACCESS (CUSTOM PROCEDURE TRAY) ×2 IMPLANT
PAD ARMBOARD 7.5X6 YLW CONV (MISCELLANEOUS) ×4 IMPLANT
SUT PROLENE 6 0 BV (SUTURE) ×4 IMPLANT
SUT VIC AB 3-0 SH 27 (SUTURE) ×2
SUT VIC AB 3-0 SH 27X BRD (SUTURE) ×2 IMPLANT
UNDERPAD 30X30 INCONTINENT (UNDERPADS AND DIAPERS) ×2 IMPLANT
WATER STERILE IRR 1000ML POUR (IV SOLUTION) ×2 IMPLANT

## 2014-09-13 NOTE — Transfer of Care (Signed)
Immediate Anesthesia Transfer of Care Note  Patient: Brandy Houston  Procedure(s) Performed: Procedure(s): Brachial Artery to Brachial Vein Gortex Four - Seven Stretch GRAFT INSERTION Left Forearm (Left)  Patient Location: PACU  Anesthesia Type:General  Level of Consciousness: awake, alert  and oriented  Airway & Oxygen Therapy: Patient Spontanous Breathing and Patient connected to nasal cannula oxygen  Post-op Assessment: Report given to RN and Post -op Vital signs reviewed and stable  Post vital signs: Reviewed and stable  Last Vitals:  Filed Vitals:   09/13/14 1254  BP: 141/55  Pulse:   Temp: 36.8 C  Resp:     Complications: No apparent anesthesia complications

## 2014-09-13 NOTE — Clinical Social Work Note (Addendum)
CSW informed that patient has been set-up for outpatient dialysis at [facility], MWF. CSW contacted financial counselor, Aleene Davidson and was advised that Medicaid application completed [id number] by on-site DSS Medicaid worker Carmina Miller. Medicaid application still pending at this time and patient also has applied for social security disability. CSW also spoke with inpatient rehab admissions coordinator, Jodell Cipro regarding patient.  Call made to [facility] and message left regarding patient.  CSW visited with patient at approx. 4:40 pm regarding discharge plans and made patient aware that CSW has been following her progress and working on SNF placement for short-term rehab. Patient also informed that inpatient rehab has also been following and that someone from CIR will talk with her tomorrow. Patient acknowledges the need for rehab and is open to it.  Brandy Houston, MSW, LCSW Licensed Clinical Social Worker [place] [phone]

## 2014-09-13 NOTE — H&P (View-Only) (Signed)
Patient name: Brandy Houston MRN: [id number] DOB: March 28, 1956 Sex: female   Referred by: Dr. [name]  Reason for referral:  Chief Complaint  Patient presents with  . Shoulder Pain    HISTORY OF PRESENT ILLNESS: Patient is a 59 year old female seen for discussion for permanent access. She has a complicated history. Most recently multiple ointment septic arthritis. She had the worsening renal insufficiency and now is in renal failure. She is currently being dialyzed via a right IJ tunneled catheter. She felt that this is a permanent renal failure and therefore is seen for discussion of permanent access. She has had septic joint in her right hand and forearm was drained with some healing of this. She is halfway through an 8 week course of IV antibiotics. She has no difficulty with her left arm. She is right-handed.  Past Medical History  Diagnosis Date  . Diabetes mellitus   . Hypertension   . Peripheral neuropathy     Past Surgical History  Procedure Laterality Date  . Cholecystectomy    . Appendectomy    . Small intestine surgery      Due to Small Bowel Obstruction  . Tubal ligation    . Knee arthroscopy Right 08/10/2014    Procedure: ARTHROSCOPY I & D KNEE;  Surgeon: Marianna Payment, MD;  Location: [facility];  Service: Orthopedics;  Laterality: Right;  . Shoulder arthroscopy Bilateral 08/10/2014    Procedure: I & D BILATERAL SHOULDERS ;  Surgeon: Marianna Payment, MD;  Location: [facility];  Service: Orthopedics;  Laterality: Bilateral;  . Tenosynovectomy Right 08/11/2014    Procedure: RIGHT WRIST IRRIGATION AND DEBRIDEMENT, TENOSYNOVECTOMY;  Surgeon: Marianna Payment, MD;  Location: [place];  Service: Orthopedics;  Laterality: Right;  . Knee arthroscopy Left 08/11/2014    Procedure: ARTHROSCOPIC WASHOUT LEFT KNEE;  Surgeon: Marianna Payment, MD;  Location: [place];  Service: Orthopedics;  Laterality: Left;  . Tee without cardioversion N/A 08/14/2014    Procedure: TRANSESOPHAGEAL  ECHOCARDIOGRAM (TEE);  Surgeon: Thayer Headings, MD;  Location: [place];  Service: Cardiovascular;  Laterality: N/A;  . Knee arthroscopy Left 08/19/2014    Procedure: ARTHROSCOPIC WASHOUT LEFT KNEE;  Surgeon: Leandrew Koyanagi, MD;  Location: [place];  Service: Orthopedics;  Laterality: Left;    History   Social History  . Marital Status: Single    Spouse Name: N/A  . Number of Children: N/A  . Years of Education: N/A   Occupational History  . Not on file.   Social History Main Topics  . Smoking status: Current Every Day Smoker -- 0.50 packs/day for 20 years  . Smokeless tobacco: Never Used  . Alcohol Use: Yes     Comment: ocassionally  . Drug Use: No  . Sexual Activity: No   Other Topics Concern  . Not on file   Social History Narrative    Family History  Problem Relation Age of Onset  . Cancer Mother   . Heart disease Father   . Cancer Sister   . Cancer Brother   . Diabetes Brother     Allergies as of 08/07/2014 - Review Complete 08/07/2014  Allergen Reaction Noted  . Compazine [prochlorperazine] Shortness Of Breath and Swelling 11/30/2012  . Omnipaque [iohexol] Hives 10/18/2011  . Shellfish-derived products Anaphylaxis 11/04/2013  . Iodinated diagnostic agents Rash 11/04/2013    No current facility-administered medications on file prior to encounter.   Current Outpatient Prescriptions on File Prior to Encounter  Medication Sig  Dispense Refill  . Blood Glucose Monitoring Suppl (ON CALL EXPRESS GLUCOSE METER) DEVI Use as directed 1 Device 0  . glipiZIDE (GLUCOTROL) 10 MG tablet Take 1 tablet (10 mg total) by mouth 2 (two) times daily before a meal. 60 tablet 3  . glucose blood test strip Use as instructed 100 each 12  . hydrALAZINE (APRESOLINE) 25 MG tablet Take 1 tablet (25 mg total) by mouth 3 (three) times daily. 90 tablet 3  . TRUEPLUS LANCETS 30G MISC Use as directed 100 each 2  . aspirin EC 81 MG tablet Take 1 tablet (81 mg total) by mouth daily as needed  for pain. 30 tablet 3     REVIEW OF SYSTEMS:  Reviewed in her history and physical with nothing to add   PHYSICAL EXAMINATION:  General: The patient is a well-nourished female, in no acute distress. Vital signs are BP 145/86 mmHg  Pulse 77  Temp(Src) 97.9 F (36.6 C) (Oral)  Resp 18  Ht 5\' 7"  (1.702 m)  Wt 221 lb 4.8 oz (100.381 kg)  BMI 34.65 kg/m2  SpO2 95%  LMP 03/16/2002 Pulmonary: There is a good air exchange   Musculoskeletal: Dressing intact over right forearm from prior drainage of the septic tendon sheath and healing incision in her right hand Neurologic: No focal weakness or paresthesias are detected, Skin: There are no ulcer or rashes noted. Psychiatric: The patient has normal affect. Cardiovascular: 2+ radial pulses bilaterally Small surface veins in her left arm   Vascular Lab Studies:  Vein mapping pending  Impression and Plan:  End stage renal disease. Discussed options with the patient. Explained the option of left arm AV fistula or AV graft. Is not her right arm candidate due to her recent infection and abscess drainage. Plan for access on Wednesday of this week. Will make final recommendations following her vein mapping.    [facility] Office: [phone]

## 2014-09-13 NOTE — Progress Notes (Signed)
PT Cancellation Note  Patient Details Name: Brandy Houston MRN: [id number] DOB: July 25, 1955   Cancelled Treatment:    Reason Eval/Treat Not Completed: Patient at procedure or test/unavailable   To OR for fistula creation;   Will follow up later today as time and pt status allow;  Otherwise, will follow up for PT tomorrow;   Thank you,  Roney Marion, PT  Acute Rehabilitation Services Pager [phone] Office [phone]     Roney Marion [facility] 09/13/2014, 10:42 AM

## 2014-09-13 NOTE — Discharge Instructions (Signed)
° ° °  09/13/2014 Brandy Houston [id number] 28-Nov-1955  Surgeon(s): Mal Misty, MD  Procedure(s): ARTERIOVENOUS (AV) Gortex GRAFT INSERTION-left forearm  x Do not stick graft for 4 weeks

## 2014-09-13 NOTE — Progress Notes (Signed)
09/13/2014 3:32 PM Hemodialysis Outpatient Note; this patient has been accepted at the [facility] on a Monday Wednesday and Friday 2nd shift schedule. The center can begin treatment on Friday April 1 at 12pm. Thank you. Gordy Savers

## 2014-09-13 NOTE — Progress Notes (Signed)
ANTIBIOTIC CONSULT NOTE - FOLLOW UP  Pharmacy Consult for Ancef Indication: disseminated MSSA infection  Allergies  Allergen Reactions  . Compazine [Prochlorperazine] Shortness Of Breath and Swelling    TONGUE SWELLS  . Omnipaque [Iohexol] Hives  . Shellfish-Derived Products Anaphylaxis  . Iodinated Diagnostic Agents Rash    Patient Measurements: Height: 5\' 7"  (170.2 cm) Weight: 210 lb 1.6 oz (95.301 kg) IBW/kg (Calculated) : 61.6  Vital Signs: Temp: 98.3 F (36.8 C) (03/30 0500) Temp Source: Oral (03/30 0500) BP: 154/73 mmHg (03/30 0500) Pulse Rate: 84 (03/30 0500) Intake/Output from previous day: 03/29 0701 - 03/30 0700 In: 1160 [P.O.:600; I.V.:360; IV Piggyback:200] Out: 3002 [Stool:2] Intake/Output from this shift:    Labs:  Recent Labs  09/11/14 0510 09/12/14 0500 09/12/14 0501 09/13/14 0534  WBC 6.6 5.7  --  6.7  HGB 7.9* 7.8*  --  8.0*  PLT 328 349  --  359  CREATININE 7.56*  --  7.98* 5.34*  5.22*   Estimated Creatinine Clearance: 13.8 mL/min (by C-G formula based on Cr of 5.22). No results for input(s): VANCOTROUGH, VANCOPEAK, VANCORANDOM, GENTTROUGH, GENTPEAK, GENTRANDOM, TOBRATROUGH, TOBRAPEAK, TOBRARND, AMIKACINPEAK, AMIKACINTROU, AMIKACIN in the last 72 hours.   Microbiology: Recent Results (from the past 720 hour(s))  Culture, blood (routine x 2)     Status: None   Collection Time: 08/15/14  1:15 PM  Result Value Ref Range Status   Specimen Description BLOOD RIGHT HAND  Final   Special Requests BOTTLES DRAWN AEROBIC ONLY 3CC  Final   Culture   Final    NO GROWTH 5 DAYS Performed at [facility]    Report Status 08/21/2014 FINAL  Final  Culture, blood (routine x 2)     Status: None   Collection Time: 08/15/14  3:11 PM  Result Value Ref Range Status   Specimen Description BLOOD RIGHT HAND  Final   Special Requests   Final    BOTTLES DRAWN AEROBIC AND ANAEROBIC BLUE 10CC RED 5CC   Culture   Final    NO GROWTH 5 DAYS Performed at  [facility]    Report Status 08/21/2014 FINAL  Final  Culture, Urine     Status: None   Collection Time: 08/17/14  3:14 PM  Result Value Ref Range Status   Specimen Description URINE, CATHETERIZED  Final   Special Requests NONE  Final   Colony Count NO GROWTH Performed at [facility]   Final   Culture NO GROWTH Performed at [facility]   Final   Report Status 08/18/2014 FINAL  Final  Body fluid culture     Status: None   Collection Time: 08/18/14  4:52 PM  Result Value Ref Range Status   Specimen Description FLUID SYNOVIAL LEFT KNEE  Final   Special Requests Normal  Final   Gram Stain   Final    MODERATE WBC PRESENT,BOTH PMN AND MONONUCLEAR NO ORGANISMS SEEN Performed at [facility]    Culture   Final    NO GROWTH 3 DAYS Performed at [facility]    Report Status 08/22/2014 FINAL  Final  Culture, Urine     Status: None   Collection Time: 09/02/14  3:04 PM  Result Value Ref Range Status   Specimen Description URINE, CATHETERIZED  Final   Special Requests NONE  Final   Colony Count   Final    >=100,000 COLONIES/ML Performed at [facility]    Culture   Final  PSEUDOMONAS AERUGINOSA Performed at [facility]    Report Status 09/05/2014 FINAL  Final   Organism ID, Bacteria PSEUDOMONAS AERUGINOSA  Final      Susceptibility   Pseudomonas aeruginosa - MIC*    CEFEPIME 2 SENSITIVE Sensitive     CEFTAZIDIME 4 SENSITIVE Sensitive     CIPROFLOXACIN 0.5 SENSITIVE Sensitive     GENTAMICIN <=1 SENSITIVE Sensitive     IMIPENEM >=16 RESISTANT Resistant     PIP/TAZO 8 SENSITIVE Sensitive     TOBRAMYCIN <=1 SENSITIVE Sensitive     * PSEUDOMONAS AERUGINOSA  Surgical pcr screen     Status: None   Collection Time: 09/13/14  1:12 AM  Result Value Ref Range Status   MRSA, PCR NEGATIVE NEGATIVE Final   Staphylococcus aureus NEGATIVE NEGATIVE Final    Comment:        The Xpert SA Assay (FDA approved for NASAL  specimens in patients over 61 years of age), is one component of a comprehensive surveillance program.  Test performance has been validated by [facility] for patients greater than or equal to 40 year old. It is not intended to diagnose infection nor to guide or monitor treatment.     Anti-infectives    Start     Dose/Rate Route Frequency Ordered Stop   09/13/14 0600  cefUROXime (ZINACEF) 1.5 g in dextrose 5 % 50 mL IVPB  Status:  Discontinued     1.5 g 100 mL/hr over 30 Minutes Intravenous On call to O.R. 09/12/14 1120 09/12/14 1141   09/05/14 2000  ciprofloxacin (CIPRO) tablet 500 mg  Status:  Discontinued     500 mg Oral Every 24 hours 09/05/14 1820 09/10/14 1031   09/04/14 1100  levofloxacin (LEVAQUIN) tablet 250 mg  Status:  Discontinued    Comments:  For UTI   250 mg Oral Daily 09/04/14 1019 09/05/14 1803   09/02/14 1800  ceFAZolin (ANCEF) IVPB 1 g/50 mL premix     1 g 100 mL/hr over 30 Minutes Intravenous Every 24 hours 09/01/14 1457 10/05/14 2359   08/29/14 0900  ceFAZolin (ANCEF) IVPB 2 g/50 mL premix     2 g 100 mL/hr over 30 Minutes Intravenous  Once 08/29/14 0854 08/29/14 0925   08/29/14 0802  ceFAZolin (ANCEF) 2-3 GM-% IVPB SOLR  Status:  Discontinued    Comments:  Laughlin, Amy   : cabinet override      08/29/14 0802 08/29/14 0815   08/28/14 2200  ceFAZolin (ANCEF) IVPB 2 g/50 mL premix  Status:  Discontinued     2 g 100 mL/hr over 30 Minutes Intravenous Every M-W-F (Hemodialysis) 08/28/14 1501 09/01/14 1457   08/28/14 1200  ceFAZolin (ANCEF) IVPB 2 g/50 mL premix  Status:  Discontinued    Comments:  On call to xray 3/14   2 g 100 mL/hr over 30 Minutes Intravenous On call 08/28/14 0924 08/28/14 1446   08/23/14 2200  fluconazole (DIFLUCAN) tablet 150 mg     150 mg Oral  Once 08/23/14 2151 08/23/14 2252   08/19/14 0500  ceFAZolin (ANCEF) IVPB 2 g/50 mL premix  Status:  Discontinued     2 g 100 mL/hr over 30 Minutes Intravenous Every 12 hours 08/18/14 1524  08/28/14 1501   08/18/14 1800  ceFAZolin (ANCEF) IVPB 2 g/50 mL premix  Status:  Discontinued     2 g 100 mL/hr over 30 Minutes Intravenous Every 12 hours 08/18/14 1517 08/18/14 1524   08/18/14 1330  ceFAZolin (ANCEF) 2-3 GM-% IVPB  SOLR    Comments:  Barley, Jenny   : cabinet override      08/18/14 1330 08/19/14 0144   08/18/14 0600  ceFAZolin (ANCEF) IVPB 1 g/50 mL premix  Status:  Discontinued     1 g 100 mL/hr over 30 Minutes Intravenous Every 12 hours 08/17/14 1822 08/18/14 1517   08/16/14 1800  ceFAZolin (ANCEF) IVPB 2 g/50 mL premix  Status:  Discontinued     2 g 100 mL/hr over 30 Minutes Intravenous Every 12 hours 08/16/14 1355 08/17/14 1822   08/15/14 1200  nafcillin 2 g in dextrose 5 % 50 mL IVPB  Status:  Discontinued     2 g 100 mL/hr over 30 Minutes Intravenous 6 times per day 08/15/14 1028 08/16/14 1253   08/14/14 1400  ceFAZolin (ANCEF) IVPB 2 g/50 mL premix  Status:  Discontinued     2 g 100 mL/hr over 30 Minutes Intravenous 3 times per day 08/14/14 0850 08/15/14 1028   08/12/14 1800  vancomycin (VANCOCIN) 500 mg in sodium chloride 0.9 % 100 mL IVPB  Status:  Discontinued     500 mg 100 mL/hr over 60 Minutes Intravenous Every 12 hours 08/12/14 0705 08/12/14 0721   08/12/14 1800  vancomycin (VANCOCIN) IVPB 750 mg/150 ml premix  Status:  Discontinued     750 mg 150 mL/hr over 60 Minutes Intravenous Every 12 hours 08/12/14 0721 08/12/14 1315   08/10/14 2138  vancomycin (VANCOCIN) powder  Status:  Discontinued       As needed 08/10/14 2138 08/10/14 2153   08/10/14 1600  cefTRIAXone (ROCEPHIN) 2 g in dextrose 5 % 50 mL IVPB - Premix  Status:  Discontinued     2 g 100 mL/hr over 30 Minutes Intravenous Every 24 hours 08/09/14 1555 08/10/14 1432   08/10/14 1600  ceFAZolin (ANCEF) IVPB 2 g/50 mL premix  Status:  Discontinued     2 g 100 mL/hr over 30 Minutes Intravenous 3 times per day 08/10/14 1432 08/14/14 0845   08/10/14 0600  vancomycin (VANCOCIN) IVPB 750 mg/150 ml premix   Status:  Discontinued     750 mg 150 mL/hr over 60 Minutes Intravenous Every 12 hours 08/09/14 2018 08/12/14 0705   08/09/14 1800  vancomycin (VANCOCIN) 2,000 mg in sodium chloride 0.9 % 500 mL IVPB     2,000 mg 250 mL/hr over 120 Minutes Intravenous  Once 08/09/14 1616 08/09/14 1929   08/09/14 1630  cefTRIAXone (ROCEPHIN) 2 g in dextrose 5 % 50 mL IVPB - Premix     2 g 100 mL/hr over 30 Minutes Intravenous  Once 08/09/14 1553 08/09/14 1653      Assessment: 71 yoF admitted with intractable shoulder pain, 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 Patient continues on Ancef, planning for 8 weeks of therapy (end date 4/21). Patient with ARF, started on HD this admission with inconsistent schedule, adjusted Ancef to renal dosing. Now deemed ESRD, creating AVG on 3/30 for long-term HD, schedule undetermined. Last HD session on 3/29. Patient remains afebrile, WBC wnl.  Goal of Therapy:  Eradication of infection  Plan:  -Ancef 1 g IV q24h -Monitor clinical progress -Follow up HD schedule to change to 2 g IV qHD  Whitney Muse D 09/13/2014,9:29 AM

## 2014-09-13 NOTE — Progress Notes (Signed)
[facility] Rounding Note Subjective:  Just back from vascular access procedure Had left FA loop graft placed Good bruit, no hand pain  Objective Vital signs in last 24 hours: Filed Vitals:   09/13/14 1315 09/13/14 1330 09/13/14 1340 09/13/14 1403  BP: 153/73 132/105  155/77  Pulse: 79 79 80 77  Temp:   98.2 F (36.8 C) 98.2 F (36.8 C)  TempSrc:    Oral  Resp: '14 14 14 18  ' Height:      Weight:      SpO2: 96% 97% 100% 99%   Weight change: -6.895 kg (-15 lb 3.2 oz)  Intake/Output Summary (Last 24 hours) at 09/13/14 1510 Last data filed at 09/13/14 1240  Gross per 24 hour  Intake   1560 ml  Output      2 ml  Net   1558 ml   Physical Exam:  BP 155/77 mmHg  Pulse 77  Temp(Src) 98.2 F (36.8 C) (Oral)  Resp 18  Ht '5\' 7"'  (1.702 m)  Wt 95.301 kg (210 lb 1.6 oz)  BMI 32.90 kg/m2  SpO2 99%  LMP 03/16/2002 Very nice older AAF - disgruntled about being off the floor all day - and says was confused about AVG - thought it could be used "right away" - explained it would be a month before could use. Some incisional pain VS as noted Lungs grossly clear anteriorly Abd soft not tender +BS SCD's in place 2+ edema bilat lower extremities  Right IJ TDC non-tender (3/15) Left IJ PICC line in place New left FA AVG (3/30) with + bruit. Hand warm.    Recent Labs Lab 09/07/14 1050 09/08/14 0407 09/09/14 1227 09/10/14 0430 09/11/14 0510 09/12/14 0501 09/12/14 0700 09/13/14 0534  NA 129* 126* 127* 130* 129* 130*  --  136  135  K 4.6 4.6 4.7 4.0 4.4 4.6  --  4.0  3.9  CL 94* 95* 95* 98 96 96  --  99  99  CO2 '25 24 23 27 27 25  ' --  30  33*  GLUCOSE 135* 111* 134* 108* 105* 105*  --  127*  125*  BUN 28* 31* 35* 22 24* 27*  --  13  13  CREATININE 8.17* 8.73* 9.01* 7.02* 7.56* 7.98*  --  5.34*  5.22*  CALCIUM 8.2* 8.0* 8.1* 7.9* 7.9* 8.0* 8.1* 8.1*  7.9*  PHOS 4.8*  --   --   --   --  4.9*  --  3.7    Recent Labs Lab 09/07/14 1050 09/12/14 0501  09/13/14 0534  ALBUMIN 1.4* 1.3* 1.4*    Recent Labs Lab 09/10/14 0430 09/11/14 0510 09/12/14 0500 09/13/14 0534  WBC 5.6 6.6 5.7 6.7  HGB 7.8* 7.9* 7.8* 8.0*  HCT 25.0* 25.0* 24.5* 26.0*  MCV 93.3 94.0 92.8 94.5  PLT 312 328 349 359     Recent Labs Lab 09/12/14 1622 09/12/14 2158 09/13/14 0729 09/13/14 1027 09/13/14 1301  GLUCAP 146* 131* 106* 111* 93    Medications: . sodium chloride 10 mL/hr at 09/10/14 1813   . antiseptic oral rinse  7 mL Mouth Rinse q12n4p  . aspirin  81 mg Oral Daily  .  ceFAZolin (ANCEF) IV  1 g Intravenous Q24H  . chlorhexidine  15 mL Mouth Rinse BID  . cyclobenzaprine  5 mg Oral BID  . [START ON 09/19/2014] darbepoetin (ARANESP) injection - DIALYSIS  200 mcg Intravenous Q Tue-HD  . famotidine  20 mg Oral Daily  .  heparin subcutaneous  5,000 Units Subcutaneous 3 times per day  . hydrALAZINE  25 mg Oral BID  . insulin aspart  0-9 Units Subcutaneous TID WC  . lanthanum  1,000 mg Oral TID WC  . metoprolol tartrate  50 mg Oral BID  . multivitamin  1 tablet Oral QHS  . nicotine  21 mg Transdermal Daily  . polyethylene glycol  17 g Oral BID  . sodium chloride  10-40 mL Intracatheter Q12H  . sodium chloride  10-40 mL Intracatheter Q12H  . sodium chloride  3 mL Intravenous Q12H   Background 59yo AAF with PMH sig for poorly controlled DM, HTN, CKD (baseline Scr 1.3-1.6) and new diagnosis of Hep C infection who was [facility] to [facility] with left shoulder pain. She had an elevated D-Dimer and underwent CT-angio to r/o PE on 08/07/14 and was admitted for further evaluation. She then had an MRI with gadolinium on 08/11/14 which suggested septic arthritis. She had another MRI with gadolinium of her hand on 08/08/14 which was consitent with cellulitis. She was felt to have polyarticular septic arthritis and started on Vancomycin and Ancef for 3 days until being changed to nafcillin. She had a negative TEE for SBE . Creatinine was 3.1 on 08/15/14 and rose  progressively. Renal biopsy done 08/18/14 showed changes of DM plus diffuse proliferative lesion c/w post-infectious GN. Despite treatment of her infection, there has been no recovery of kidney function. Dialysis initiated 08/20/14.   Assessment/Recommendations:   AKI on CKD - new ESRD. 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 Recovery is dependent on treatment of the underlying cause, but she has shown no improvement despite ATB treatment for her infection. Dialysis dependent since 08/20/14.  We have deemed her ESRD at this time. Had HD yesterday 3/29. Her outpt schedule will be MWF at [facility] but she would rather wait until tomorrow to HD - will HD tomorrow and then again on Friday to ger her on schedule. EDW not yet established.  Has permanent access now (L AVG - Dr. Kellie Simmering 3/30) and TDC (3/15 IR)  Anemia - On Aranesp. Rec'd feraheme earlier in adm but TSat still low at 4 - will dose ferrelecit with HD for total of 5 doses to start 3/31.  Met bone ds - PTH 72. Phos 3.7. No binder or VDRA needed at this time  MSSA bacteremia with septic polyarthritis - to complete 8 weeks of Ancef. Day 1 was 08/11/24. TEE was negative. Once ready for discharge will be able to receive Ancef at the outpt HD unit to complete the 8 week course..  Malnutrition - supplements. Renal vitamin.  Hep C Ab +  DM per primary  HTN good. Anticipate need for less medication once volume under better control. Haved reduced hydralazine and BB in that regard to allow for fluid removal with HD  Malnutrition - supplements  Dispo - ? Rehab vs SNF?    Jamal Maes, MD [facility] [phone] pager 09/13/2014, 3:10 PM

## 2014-09-13 NOTE — Progress Notes (Signed)
[facility] PROGRESS NOTE  Brandy Houston [id number] DOB: [birth year] DOA: 08/07/2014 PCP: Chari Manning, NP Interim summary: 59 year old lady with multiple medical problems initially admitted for left shoulder pain, further work up revealed MSSA bacteremia with septic arthritis of the numerous joints.  She underwent arthroscopic irrigation and debridement of the septic arthritis.  TEE is negative for endocarditis.  She was started on ancef by ID and recommended 8 weeks of treatment. Meanwhile her renal function worsened and renal consulted.  She was started on HD sessions intermittent from 3/9. Required intermittent HD and now declared ESRD and started CLIP process this week AV fistula is done   Assessment/Plan: 1.  MSSA BACTEREMIA with septic arthritis: -Bilateral knee and shoulder septic arthritis she underwent irrigation and debridement of Right knee, left knee, Right AC & left AC joints, and Irrigation and debridement of right hand flexor tendon sheath. -Currently on cefazolin to continue a total of 8 weeks of treatment, 2/26 as day1 and last day 4/22 -Underwent TTE on 2/24 & TEE on 2/29 that was negative -She again underwent R knee arthroscopic irrigation and debridement on 3/5 by Dr [name] -Pain control, ambulate, PT -ID and orthopedics are available as needed.   2. UTI -She had episode of fever on 3/19, possibly from UTI, urine cultures showed pseudomonas sensitive to ciprofloxacin -completed 5 days of ciprofloxacin on 3/26  3. 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  -started on Temporary HD via R tunned IJ dialysis catheter, started Hd on 3/9 then on 3/14, and HD 3/19 -Was then on high dose IV lasix without recovery per Renal -some symptoms of uremia and volume overloaded-significantly 3/26 -s/p HD again 3/26 and 3/29, started CLIP process, plan for AVf per [facility] tomorrow  4. Diabetes  mellitus; -stable -SSI  5. Hepatitis C antibody positive: -FU with ID as outpatient.   6. Anemia; -Anemia due to chronic disease and blood loss, acute illness -epo as per renal. She received a dose of ferra heme  -Transfuse to keep hemoglobin less than 7 -stable, monitor  7. Severe malnutrition -likely due to severe sepsis and third spacing -Severe hypoalbuminemia, Nutrition supplementation.  -Concern about nephrotic syndrome with high protein in the urine  8. Hypertension:  well controlled.  9.Urinary retention: resolved -stopped oxybutynin,  -removed foley 3/27  10.Debility -Continue to physical therapy and occupational therapy -Plan to discharge the patient to skilled nursing facility  11. Hyponatremia --Hypervolemic hyponatremia. -  DVT proph: Hep Sq  Code Status: full code.  Family Communication: none at bedside Disposition Plan: pending, plan for SNF, s/p CLIP   Consultants:  Renal  ID  Orthopedics.     Procedures:  HD   Arthroscopic irrigation and debridement of septic arthritis of left knee on 3/5   Arthroscopic extensive synovectomy in all 3 compartments of the left knee ON 3/5  Renal biopsy done on march 4 th 2016 and was found to have C3  Acute diffuse proliferative glomerulonephritis.   Antibiotics:  Ancef   HPI/Subjective: Feels tired, s/p HD today  Objective: Filed Vitals:   09/13/14 1403  BP: 155/77  Pulse: 77  Temp: 98.2 F (36.8 C)  Resp: 18    Intake/Output Summary (Last 24 hours) at 09/13/14 1652 Last data filed at 09/13/14 1240  Gross per 24 hour  Intake   1560 ml  Output      2 ml  Net   1558 ml  Filed Weights   09/11/14 2050 09/12/14 0635 09/12/14 2100  Weight: 102.195 kg (225 lb 4.8 oz) 99.5 kg (219 lb 5.7 oz) 95.301 kg (210 lb 1.6 oz)    Exam:   General:  Alert afebrile comfortable, chronically ill appearing, neck with central line  Cardiovascular: S1S2, RRR   Respiratory: bibasilar faint  crackles  Abdomen: soft non tender non distended bowel sounds heard  Musculoskeletal: leg edema 2plus, L knee with swelling and insicion c/d/i  Data Reviewed: Basic Metabolic Panel:  Recent Labs Lab 09/07/14 1050  09/09/14 1227 09/10/14 0430 09/11/14 0510 09/12/14 0501 09/12/14 0700 09/13/14 0534  NA 129*  < > 127* 130* 129* 130*  --  136  135  K 4.6  < > 4.7 4.0 4.4 4.6  --  4.0  3.9  CL 94*  < > 95* 98 96 96  --  99  99  CO2 25  < > 23 27 27 25   --  30  33*  GLUCOSE 135*  < > 134* 108* 105* 105*  --  127*  125*  BUN 28*  < > 35* 22 24* 27*  --  13  13  CREATININE 8.17*  < > 9.01* 7.02* 7.56* 7.98*  --  5.34*  5.22*  CALCIUM 8.2*  < > 8.1* 7.9* 7.9* 8.0* 8.1* 8.1*  7.9*  PHOS 4.8*  --   --   --   --  4.9*  --  3.7  < > = values in this interval not displayed. Liver Function Tests:  Recent Labs Lab 09/07/14 1050 09/12/14 0501 09/13/14 0534  ALBUMIN 1.4* 1.3* 1.4*   No results for input(s): LIPASE, AMYLASE in the last 168 hours. No results for input(s): AMMONIA in the last 168 hours. CBC:  Recent Labs Lab 09/09/14 1228 09/10/14 0430 09/11/14 0510 09/12/14 0500 09/13/14 0534  WBC 7.5 5.6 6.6 5.7 6.7  HGB 8.5* 7.8* 7.9* 7.8* 8.0*  HCT 27.2* 25.0* 25.0* 24.5* 26.0*  MCV 93.8 93.3 94.0 92.8 94.5  PLT 397 312 328 349 359   Cardiac Enzymes: No results for input(s): CKTOTAL, CKMB, CKMBINDEX, TROPONINI in the last 168 hours. BNP (last 3 results) No results for input(s): BNP in the last 8760 hours.  ProBNP (last 3 results) No results for input(s): PROBNP in the last 8760 hours.  CBG:  Recent Labs Lab 09/12/14 1622 09/12/14 2158 09/13/14 0729 09/13/14 1027 09/13/14 1301  GLUCAP 146* 131* 106* 111* 93    Recent Results (from the past 240 hour(s))  Surgical pcr screen     Status: None   Collection Time: 09/13/14  1:12 AM  Result Value Ref Range Status   MRSA, PCR NEGATIVE NEGATIVE Final   Staphylococcus aureus NEGATIVE NEGATIVE Final     Comment:        The Xpert SA Assay (FDA approved for NASAL specimens in patients over 19 years of age), is one component of a comprehensive surveillance program.  Test performance has been validated by [facility] for patients greater than or equal to 93 year old. It is not intended to diagnose infection nor to guide or monitor treatment.      Studies: No results found.  Scheduled Meds: . antiseptic oral rinse  7 mL Mouth Rinse q12n4p  . aspirin  81 mg Oral Daily  .  ceFAZolin (ANCEF) IV  1 g Intravenous Q24H  . chlorhexidine  15 mL Mouth Rinse BID  . cyclobenzaprine  5 mg Oral BID  . [START ON  09/19/2014] darbepoetin (ARANESP) injection - DIALYSIS  200 mcg Intravenous Q Tue-HD  . famotidine  20 mg Oral Daily  . [START ON 09/15/2014] ferric gluconate (FERRLECIT/NULECIT) IV  125 mg Intravenous Q M,W,F-HD  . [START ON 09/14/2014] ferric gluconate (FERRLECIT/NULECIT) IV  125 mg Intravenous Once  . heparin subcutaneous  5,000 Units Subcutaneous 3 times per day  . hydrALAZINE  25 mg Oral BID  . insulin aspart  0-9 Units Subcutaneous TID WC  . lanthanum  1,000 mg Oral TID WC  . metoprolol tartrate  50 mg Oral BID  . multivitamin  1 tablet Oral QHS  . nicotine  21 mg Transdermal Daily  . polyethylene glycol  17 g Oral BID  . sodium chloride  10-40 mL Intracatheter Q12H  . sodium chloride  10-40 mL Intracatheter Q12H  . sodium chloride  3 mL Intravenous Q12H   Continuous Infusions: . sodium chloride 10 mL/hr at 09/10/14 1813    Principal Problem:   Staphylococcus aureus bacteremia Active Problems:   Septic joint of left shoulder region   Sinus tachycardia   Septic joint of right knee joint   CKD (chronic kidney disease) stage 3, GFR 30-59 ml/min   Septic joint of right hand   SIRS (systemic inflammatory response syndrome)   Benign essential HTN   Hepatitis C   Acute renal failure   Acute renal failure syndrome   Hep C w/ coma, chronic   Joint infection    Time spent:  25 minutes.     [facility]  [facility] Pager [phone] If 7PM-7AM, please contact night-coverage at [email or web address], password [facility] 09/13/2014, 4:52 PM  LOS: 37 days

## 2014-09-13 NOTE — Interval H&P Note (Signed)
History and Physical Interval Note:  09/13/2014 11:02 AM  Brandy Houston  has presented today for surgery, with the diagnosis of End Stage Renal Disease N18.6  The various methods of treatment have been discussed with the patient and family. After consideration of risks, benefits and other options for treatment, the patient has consented to  Procedure(s): ARTERIOVENOUS (AV) FISTULA CREATION VERSUS GRAFT INSERTION (Left) as a surgical intervention .  [name] history has been reviewed, patient examined, no change in status, stable for surgery.  I have reviewed the patient's chart and labs.  Questions were answered to the patient's satisfaction.     Tinnie Gens

## 2014-09-13 NOTE — Anesthesia Postprocedure Evaluation (Signed)
  Anesthesia Post-op Note  Patient: Brandy Houston  Procedure(s) Performed: Procedure(s): Brachial Artery to Brachial Vein Gortex Four - Seven Stretch GRAFT INSERTION Left Forearm (Left)  Patient Location: PACU  Anesthesia Type:General  Level of Consciousness: awake  Airway and Oxygen Therapy: Patient Spontanous Breathing and Patient connected to nasal cannula oxygen  Post-op Pain: mild  Post-op Assessment: Post-op Vital signs reviewed and Patient's Cardiovascular Status Stable  Post-op Vital Signs: Reviewed and stable  Last Vitals:  Filed Vitals:   09/13/14 1300  BP: 159/90  Pulse: 67  Temp: 36.8 C  Resp: 10    Complications: No apparent anesthesia complications

## 2014-09-13 NOTE — Anesthesia Procedure Notes (Signed)
Procedure Name: LMA Insertion Date/Time: 09/13/2014 11:39 AM Performed by: Susa Loffler Pre-anesthesia Checklist: Patient identified, Timeout performed, Emergency Drugs available, Suction available and Patient being monitored Patient Re-evaluated:Patient Re-evaluated prior to inductionOxygen Delivery Method: Circle system utilized Preoxygenation: Pre-oxygenation with 100% oxygen Intubation Type: IV induction LMA: LMA inserted LMA Size: 4.0 Number of attempts: 1 Placement Confirmation: positive ETCO2 and breath sounds checked- equal and bilateral Tube secured with: Tape Dental Injury: Teeth and Oropharynx as per pre-operative assessment

## 2014-09-13 NOTE — Op Note (Signed)
OPERATIVE REPORT  Date of Surgery: 08/07/2014 - 09/13/2014  Surgeon: Tinnie Gens, MD  Assistant: Leontine Locket PA Pre-op Diagnosis: End Stage Renal Disease N18.6  Post-op Diagnosis: End Stage Renal Disease N18.6  Procedure: Procedure(s): Brachial Artery to Brachial Vein Gortex Four - Seven Stretch GRAFT INSERTION Left Forearm  Anesthesia: General  EBL: Minimal  Complications: None  Procedure Details:  The patient was taken the operating are placed in supine position at which time satisfactory general-LMA anesthesia was minister. Left upper extremity was prepped with Betadine scrub and solution draped in routine sterile manner. The veins were imaged with B-mode ultrasound and it was not felt that the patient was a candidate for a fistula in the left arm uterine the basilic or cephalic systems which was consistent with preoperative vein mapping. Therefore short longitudinal incision was made in the very distal upper arm brachial artery exposed. The adjacent brachial vein was 5 mm in size and it was decided to insert a forearm loop graft. Vein and artery were both dissected free and a 4 x 7 mm stretch graft was then delivered through a tunnel using a small counterincision the apex of the loop in the mid to distal forearm. No heparin was given. Brachial artery was occluded proximally and distally opened with 15 blade extended with Potts scissors. There was excellent inflow. 4 mm the graft was slightly spatulated and anastomosed inside with 6-0 Prolene. Following this the vein was ligated distally opened with longitudinally with 15 blade extended with Potts scissors. It would easily accept a 5 mm dilator. Graft slightly spatulated and anastomosed end to side the brachial vein with 6-0 Prolene. Clamps released nose excellent pulse and palpable thrill in the graft. There was a palpable radial pulse which slightly diminished with graft open but there continued to be good Doppler flow even with the  graft open. Adequate hemostasis was achieved and the wounds were both closed in layers with Vicryl subcuticular fashion with Dermabond patient taken to recovery room in stable condition   Tinnie Gens, MD 09/13/2014 12:49 PM

## 2014-09-13 NOTE — Anesthesia Preprocedure Evaluation (Addendum)
Anesthesia Evaluation  Patient identified by MRN, date of birth, ID band  Reviewed: Allergy & Precautions, NPO status , Patient's Chart, lab work & pertinent test results  Airway Mallampati: II   Neck ROM: Full    Dental  (+) Dental Advisory Given, Chipped, Missing, Loose   Pulmonary Current Smoker,  breath sounds clear to auscultation        Cardiovascular hypertension, Pt. on medications Rhythm:Regular  ECHO 07/2014 EF 55-60%, no vegetations   Neuro/Psych    GI/Hepatic (+) Hepatitis -, C  Endo/Other  diabetes, Type 2  Renal/GU Renal Insufficiency and DialysisRenal diseaseCreat 5.1     Musculoskeletal Septic arthritis with multiple joint involvement this admiossion   Abdominal (+)  Abdomen: soft.    Peds  Hematology  (+) anemia , 8/26   Anesthesia Other Findings Very poor teeth, some missing, top ftont broken off, many loose.  Advisory given  Reproductive/Obstetrics                           Anesthesia Physical Anesthesia Plan  ASA: III  Anesthesia Plan: MAC   Post-op Pain Management:    Induction:   Airway Management Planned:   Additional Equipment:   Intra-op Plan:   Post-operative Plan:   Informed Consent: I have reviewed the patients History and Physical, chart, labs and discussed the procedure including the risks, benefits and alternatives for the proposed anesthesia with the patient or authorized representative who has indicated his/her understanding and acceptance.     Plan Discussed with:   Anesthesia Plan Comments: (Has had LMA 4 in past)        Anesthesia Quick Evaluation

## 2014-09-14 ENCOUNTER — Encounter (HOSPITAL_COMMUNITY): Payer: Self-pay | Admitting: Vascular Surgery

## 2014-09-14 ENCOUNTER — Inpatient Hospital Stay (HOSPITAL_COMMUNITY)
Admission: AD | Admit: 2014-09-14 | Discharge: 2014-09-30 | DRG: 945 | Disposition: A | Discharge status: Home Health | Payer: Medicaid Other | Source: Intra-hospital | Attending: Physical Medicine & Rehabilitation | Admitting: Physical Medicine & Rehabilitation

## 2014-09-14 DIAGNOSIS — J9811 Atelectasis: Secondary | ICD-10-CM | POA: Diagnosis not present

## 2014-09-14 DIAGNOSIS — F17210 Nicotine dependence, cigarettes, uncomplicated: Secondary | ICD-10-CM | POA: Diagnosis present

## 2014-09-14 DIAGNOSIS — B1710 Acute hepatitis C without hepatic coma: Secondary | ICD-10-CM | POA: Diagnosis present

## 2014-09-14 DIAGNOSIS — M00061 Staphylococcal arthritis, right knee: Secondary | ICD-10-CM | POA: Diagnosis present

## 2014-09-14 DIAGNOSIS — M009 Pyogenic arthritis, unspecified: Secondary | ICD-10-CM

## 2014-09-14 DIAGNOSIS — B9561 Methicillin susceptible Staphylococcus aureus infection as the cause of diseases classified elsewhere: Secondary | ICD-10-CM | POA: Diagnosis present

## 2014-09-14 DIAGNOSIS — I120 Hypertensive chronic kidney disease with stage 5 chronic kidney disease or end stage renal disease: Secondary | ICD-10-CM | POA: Diagnosis present

## 2014-09-14 DIAGNOSIS — E1122 Type 2 diabetes mellitus with diabetic chronic kidney disease: Secondary | ICD-10-CM | POA: Diagnosis present

## 2014-09-14 DIAGNOSIS — E46 Unspecified protein-calorie malnutrition: Secondary | ICD-10-CM | POA: Diagnosis present

## 2014-09-14 DIAGNOSIS — F4323 Adjustment disorder with mixed anxiety and depressed mood: Secondary | ICD-10-CM | POA: Diagnosis present

## 2014-09-14 DIAGNOSIS — N186 End stage renal disease: Secondary | ICD-10-CM | POA: Diagnosis present

## 2014-09-14 DIAGNOSIS — E1140 Type 2 diabetes mellitus with diabetic neuropathy, unspecified: Secondary | ICD-10-CM

## 2014-09-14 DIAGNOSIS — Z992 Dependence on renal dialysis: Secondary | ICD-10-CM | POA: Diagnosis not present

## 2014-09-14 DIAGNOSIS — D638 Anemia in other chronic diseases classified elsewhere: Secondary | ICD-10-CM | POA: Diagnosis present

## 2014-09-14 DIAGNOSIS — R5381 Other malaise: Principal | ICD-10-CM | POA: Diagnosis present

## 2014-09-14 DIAGNOSIS — M00012 Staphylococcal arthritis, left shoulder: Secondary | ICD-10-CM | POA: Diagnosis present

## 2014-09-14 DIAGNOSIS — E1142 Type 2 diabetes mellitus with diabetic polyneuropathy: Secondary | ICD-10-CM | POA: Diagnosis present

## 2014-09-14 DIAGNOSIS — M00062 Staphylococcal arthritis, left knee: Secondary | ICD-10-CM | POA: Diagnosis present

## 2014-09-14 DIAGNOSIS — M00011 Staphylococcal arthritis, right shoulder: Secondary | ICD-10-CM | POA: Diagnosis present

## 2014-09-14 DIAGNOSIS — R042 Hemoptysis: Secondary | ICD-10-CM

## 2014-09-14 LAB — CBC
HCT: 26.3 % — ABNORMAL LOW (ref 36.0–46.0)
Hemoglobin: 8.1 g/dL — ABNORMAL LOW (ref 12.0–15.0)
MCH: 29.2 pg (ref 26.0–34.0)
MCHC: 30.8 g/dL (ref 30.0–36.0)
MCV: 94.9 fL (ref 78.0–100.0)
Platelets: 385 10*3/uL (ref 150–400)
RBC: 2.77 MIL/uL — ABNORMAL LOW (ref 3.87–5.11)
RDW: 15.4 % (ref 11.5–15.5)
WBC: 7.4 10*3/uL (ref 4.0–10.5)

## 2014-09-14 LAB — RENAL FUNCTION PANEL
Albumin: 1.4 g/dL — ABNORMAL LOW (ref 3.5–5.2)
Anion gap: 9 (ref 5–15)
BUN: 15 mg/dL (ref 6–23)
CO2: 26 mmol/L (ref 19–32)
Calcium: 8 mg/dL — ABNORMAL LOW (ref 8.4–10.5)
Chloride: 101 mmol/L (ref 96–112)
Creatinine, Ser: 6.22 mg/dL — ABNORMAL HIGH (ref 0.50–1.10)
GFR calc Af Amer: 8 mL/min — ABNORMAL LOW (ref >90)
GFR calc non Af Amer: 7 mL/min — ABNORMAL LOW (ref >90)
Glucose, Bld: 109 mg/dL — ABNORMAL HIGH (ref 70–99)
Phosphorus: 4.7 mg/dL — ABNORMAL HIGH (ref 2.3–4.6)
Potassium: 4.6 mmol/L (ref 3.5–5.1)
Sodium: 136 mmol/L (ref 135–145)

## 2014-09-14 LAB — GLUCOSE, CAPILLARY
Glucose-Capillary: 100 mg/dL — ABNORMAL HIGH (ref 70–99)
Glucose-Capillary: 114 mg/dL — ABNORMAL HIGH (ref 70–99)
Glucose-Capillary: 122 mg/dL — ABNORMAL HIGH (ref 70–99)

## 2014-09-14 MED ORDER — BISACODYL 10 MG RE SUPP: 10.0000 mg | Freq: Every day | RECTAL | Status: DC | PRN

## 2014-09-14 MED ORDER — GUAIFENESIN-DM 100-10 MG/5ML PO SYRP: 5.0000 mL | ORAL_SOLUTION | ORAL | Status: DC | PRN

## 2014-09-14 MED ORDER — OXYCODONE HCL 5 MG PO TABS
10.0000 mg | ORAL_TABLET | Freq: Two times a day (BID) | ORAL | Status: DC
  Administered 2014-09-15 – 2014-09-30 (×30): 10 mg via ORAL
  Filled 2014-09-14 (×28): qty 2

## 2014-09-14 MED ORDER — POLYETHYLENE GLYCOL 3350 17 G PO PACK
17.0000 g | PACK | Freq: Two times a day (BID) | ORAL | Status: DC
  Administered 2014-09-15 – 2014-09-27 (×7): 17 g via ORAL
  Filled 2014-09-14 (×33): qty 1

## 2014-09-14 MED ORDER — DIPHENHYDRAMINE HCL 25 MG PO CAPS
25.0000 mg | ORAL_CAPSULE | Freq: Four times a day (QID) | ORAL | Status: DC | PRN
  Administered 2014-09-15: 25 mg via ORAL
  Filled 2014-09-14: qty 1

## 2014-09-14 MED ORDER — HYDROMORPHONE HCL 1 MG/ML IJ SOLN
INTRAMUSCULAR | Status: AC
  Filled 2014-09-14: qty 1

## 2014-09-14 MED ORDER — ZOLPIDEM TARTRATE 5 MG PO TABS
5.0000 mg | ORAL_TABLET | Freq: Every evening | ORAL | Status: DC | PRN
  Administered 2014-09-17 – 2014-09-29 (×9): 5 mg via ORAL
  Filled 2014-09-14 (×10): qty 1

## 2014-09-14 MED ORDER — ONDANSETRON HCL 4 MG/2ML IJ SOLN
4.0000 mg | Freq: Four times a day (QID) | INTRAMUSCULAR | Status: DC | PRN
  Administered 2014-09-27 (×2): 4 mg via INTRAVENOUS
  Filled 2014-09-14 (×2): qty 2

## 2014-09-14 MED ORDER — ALPRAZOLAM 0.25 MG PO TABS
0.2500 mg | ORAL_TABLET | Freq: Three times a day (TID) | ORAL | Status: DC | PRN
  Administered 2014-09-16 – 2014-09-22 (×2): 0.25 mg via ORAL
  Filled 2014-09-14: qty 1

## 2014-09-14 MED ORDER — DARBEPOETIN ALFA 200 MCG/0.4ML IJ SOSY
200.0000 ug | PREFILLED_SYRINGE | INTRAMUSCULAR | Status: DC
  Administered 2014-09-19: 200 ug via INTRAVENOUS
  Filled 2014-09-14 (×2): qty 0.4

## 2014-09-14 MED ORDER — SODIUM CHLORIDE 0.9 % IV SOLN
125.0000 mg | INTRAVENOUS | Status: AC
  Administered 2014-09-15 – 2014-09-22 (×4): 125 mg via INTRAVENOUS
  Filled 2014-09-14 (×8): qty 10

## 2014-09-14 MED ORDER — ENOXAPARIN SODIUM 30 MG/0.3ML ~~LOC~~ SOLN
30.0000 mg | SUBCUTANEOUS | Status: DC
  Administered 2014-09-15 – 2014-09-20 (×6): 30 mg via SUBCUTANEOUS
  Filled 2014-09-14 (×8): qty 0.3

## 2014-09-14 MED ORDER — METOPROLOL TARTRATE 50 MG PO TABS: 50.0000 mg | ORAL_TABLET | Freq: Two times a day (BID) | ORAL | Status: DC

## 2014-09-14 MED ORDER — CEFAZOLIN SODIUM-DEXTROSE 2-3 GM-% IV SOLR: 2.0000 g | INTRAVENOUS | Status: DC

## 2014-09-14 MED ORDER — TRAZODONE HCL 50 MG PO TABS: 25.0000 mg | ORAL_TABLET | Freq: Every evening | ORAL | Status: DC | PRN

## 2014-09-14 MED ORDER — SALINE SPRAY 0.65 % NA SOLN
1.0000 | NASAL | Status: DC | PRN
  Filled 2014-09-14: qty 44

## 2014-09-14 MED ORDER — CETYLPYRIDINIUM CHLORIDE 0.05 % MT LIQD
7.0000 mL | Freq: Two times a day (BID) | OROMUCOSAL | Status: DC
  Administered 2014-09-15 – 2014-09-27 (×4): 7 mL via OROMUCOSAL

## 2014-09-14 MED ORDER — HYDROCODONE-ACETAMINOPHEN 5-325 MG PO TABS: 1.0000 | ORAL_TABLET | ORAL | Status: DC | PRN

## 2014-09-14 MED ORDER — SODIUM CHLORIDE 0.9 % IJ SOLN
10.0000 mL | INTRAMUSCULAR | Status: DC | PRN
  Administered 2014-09-18 – 2014-09-21 (×5): 10 mL
  Filled 2014-09-14 (×5): qty 40

## 2014-09-14 MED ORDER — NICOTINE 21 MG/24HR TD PT24
21.0000 mg | MEDICATED_PATCH | Freq: Every day | TRANSDERMAL | Status: DC
  Administered 2014-09-15 – 2014-09-30 (×16): 21 mg via TRANSDERMAL
  Filled 2014-09-14 (×19): qty 1

## 2014-09-14 MED ORDER — POLYETHYLENE GLYCOL 3350 17 G PO PACK: 17.0000 g | PACK | Freq: Two times a day (BID) | ORAL | Status: DC

## 2014-09-14 MED ORDER — OXYCODONE HCL 5 MG PO TABS
10.0000 mg | ORAL_TABLET | ORAL | Status: DC | PRN
  Administered 2014-09-14 – 2014-09-29 (×20): 10 mg via ORAL
  Filled 2014-09-14 (×23): qty 2

## 2014-09-14 MED ORDER — GI COCKTAIL ~~LOC~~
30.0000 mL | Freq: Three times a day (TID) | ORAL | Status: DC | PRN
  Filled 2014-09-14: qty 30

## 2014-09-14 MED ORDER — LANTHANUM CARBONATE 500 MG PO CHEW
1000.0000 mg | CHEWABLE_TABLET | Freq: Three times a day (TID) | ORAL | Status: DC
  Administered 2014-09-15 – 2014-09-29 (×42): 1000 mg via ORAL
  Filled 2014-09-14 (×46): qty 2

## 2014-09-14 MED ORDER — FAMOTIDINE 20 MG PO TABS
20.0000 mg | ORAL_TABLET | Freq: Every day | ORAL | Status: DC
  Administered 2014-09-15 – 2014-09-30 (×16): 20 mg via ORAL
  Filled 2014-09-14 (×19): qty 1

## 2014-09-14 MED ORDER — GUAIFENESIN-DM 100-10 MG/5ML PO SYRP: 5.0000 mL | ORAL_SOLUTION | Freq: Four times a day (QID) | ORAL | Status: DC | PRN

## 2014-09-14 MED ORDER — METOPROLOL TARTRATE 50 MG PO TABS
50.0000 mg | ORAL_TABLET | Freq: Two times a day (BID) | ORAL | Status: DC
  Administered 2014-09-14 – 2014-09-17 (×7): 50 mg via ORAL
  Filled 2014-09-14 (×10): qty 1

## 2014-09-14 MED ORDER — FLEET ENEMA 7-19 GM/118ML RE ENEM: 1.0000 | ENEMA | Freq: Once | RECTAL | Status: AC | PRN

## 2014-09-14 MED ORDER — METHOCARBAMOL 500 MG PO TABS
500.0000 mg | ORAL_TABLET | Freq: Four times a day (QID) | ORAL | Status: DC | PRN
  Administered 2014-09-15 – 2014-09-16 (×2): 500 mg via ORAL
  Filled 2014-09-14 (×2): qty 1

## 2014-09-14 MED ORDER — SODIUM CHLORIDE 0.9 % IV SOLN: 125.0000 mg | INTRAVENOUS | Status: DC

## 2014-09-14 MED ORDER — ASPIRIN 81 MG PO CHEW
81.0000 mg | CHEWABLE_TABLET | Freq: Every day | ORAL | Status: DC
  Administered 2014-09-15 – 2014-09-30 (×16): 81 mg via ORAL
  Filled 2014-09-14 (×19): qty 1

## 2014-09-14 MED ORDER — ACETAMINOPHEN 325 MG PO TABS
325.0000 mg | ORAL_TABLET | ORAL | Status: DC | PRN
  Administered 2014-09-26 – 2014-09-27 (×2): 650 mg via ORAL
  Filled 2014-09-14 (×2): qty 2

## 2014-09-14 MED ORDER — CEFAZOLIN SODIUM-DEXTROSE 2-3 GM-% IV SOLR
2.0000 g | INTRAVENOUS | Status: DC
  Filled 2014-09-14: qty 50

## 2014-09-14 MED ORDER — HYDRALAZINE HCL 25 MG PO TABS
25.0000 mg | ORAL_TABLET | Freq: Two times a day (BID) | ORAL | Status: DC
  Administered 2014-09-14 – 2014-09-20 (×9): 25 mg via ORAL
  Filled 2014-09-14 (×14): qty 1

## 2014-09-14 MED ORDER — SORBITOL 70 % SOLN: 30.0000 mL | Freq: Two times a day (BID) | Status: DC | PRN

## 2014-09-14 MED ORDER — HYDRALAZINE HCL 20 MG/ML IJ SOLN: 10.0000 mg | Freq: Three times a day (TID) | INTRAMUSCULAR | Status: DC | PRN

## 2014-09-14 MED ORDER — DARBEPOETIN ALFA 200 MCG/0.4ML IJ SOSY: 200.0000 ug | PREFILLED_SYRINGE | INTRAMUSCULAR | Status: DC

## 2014-09-14 MED ORDER — RENA-VITE PO TABS
1.0000 | ORAL_TABLET | Freq: Every day | ORAL | Status: DC
  Administered 2014-09-14 – 2014-09-29 (×16): 1 via ORAL
  Filled 2014-09-14 (×17): qty 1

## 2014-09-14 MED ORDER — SODIUM CHLORIDE 0.9 % IJ SOLN: 10.0000 mL | Freq: Two times a day (BID) | INTRAMUSCULAR | Status: DC

## 2014-09-14 MED ORDER — CHLORHEXIDINE GLUCONATE 0.12 % MT SOLN
15.0000 mL | Freq: Two times a day (BID) | OROMUCOSAL | Status: DC
  Administered 2014-09-15 – 2014-09-28 (×9): 15 mL via OROMUCOSAL
  Filled 2014-09-14 (×33): qty 15

## 2014-09-14 MED ORDER — NEPRO/CARBSTEADY PO LIQD
237.0000 mL | Freq: Three times a day (TID) | ORAL | Status: DC
  Administered 2014-09-15 – 2014-09-29 (×22): 237 mL via ORAL

## 2014-09-14 MED ORDER — CYCLOBENZAPRINE HCL 5 MG PO TABS
5.0000 mg | ORAL_TABLET | Freq: Two times a day (BID) | ORAL | Status: DC
  Administered 2014-09-14 – 2014-09-30 (×31): 5 mg via ORAL
  Filled 2014-09-14 (×35): qty 1

## 2014-09-14 MED ORDER — MAGIC MOUTHWASH W/LIDOCAINE
5.0000 mL | Freq: Three times a day (TID) | ORAL | Status: DC | PRN
  Filled 2014-09-14: qty 5

## 2014-09-14 MED ORDER — GABAPENTIN 100 MG PO CAPS
100.0000 mg | ORAL_CAPSULE | Freq: Two times a day (BID) | ORAL | Status: DC
  Administered 2014-09-14 – 2014-09-30 (×31): 100 mg via ORAL
  Filled 2014-09-14 (×36): qty 1

## 2014-09-14 MED ORDER — INSULIN ASPART 100 UNIT/ML ~~LOC~~ SOLN
0.0000 [IU] | Freq: Three times a day (TID) | SUBCUTANEOUS | Status: DC
Start: 2014-09-15 — End: 2014-09-30
  Administered 2014-09-15 – 2014-09-16 (×4): 1 [IU] via SUBCUTANEOUS
  Administered 2014-09-16: 2 [IU] via SUBCUTANEOUS
  Administered 2014-09-17 – 2014-09-20 (×7): 1 [IU] via SUBCUTANEOUS
  Administered 2014-09-21: 2 [IU] via SUBCUTANEOUS
  Administered 2014-09-22 – 2014-09-29 (×11): 1 [IU] via SUBCUTANEOUS

## 2014-09-14 NOTE — Progress Notes (Signed)
Pt arrived to unit at 2010 in bed via nursing staff. Pt verbalized understanding of rehab. Safety Fall Prevention Plan signed. Vital signs stable. Pts in no sign of distress. Will continue to monitor. Kennieth Francois, RN

## 2014-09-14 NOTE — Interval H&P Note (Signed)
Brandy Houston was admitted today to Inpatient Rehabilitation with the diagnosis of septic joints, debility.  [name] history has been reviewed, patient examined, and there is no change in status.  Patient continues to be appropriate for intensive inpatient rehabilitation.  I have reviewed the patient's chart and labs.  Questions were answered to the patient's satisfaction.  Dewell Monnier T 09/14/2014, 10:23 PM

## 2014-09-14 NOTE — Progress Notes (Signed)
ANTIBIOTIC CONSULT NOTE - FOLLOW UP  Pharmacy Consult for Ancef Indication: disseminated MSSA infection  Allergies  Allergen Reactions  . Compazine [Prochlorperazine] Shortness Of Breath and Swelling    TONGUE SWELLS  . Omnipaque [Iohexol] Hives  . Shellfish-Derived Products Anaphylaxis  . Iodinated Diagnostic Agents Rash    Patient Measurements: Height: 5\' 7"  (170.2 cm) Weight: 203 lb 4.2 oz (92.2 kg) IBW/kg (Calculated) : 61.6  Vital Signs: Temp: 98.7 F (37.1 C) (03/31 1133) Temp Source: Oral (03/31 1133) BP: 161/80 mmHg (03/31 1133) Pulse Rate: 95 (03/31 1133) Intake/Output from previous day: 03/30 0701 - 03/31 0700 In: [id number] [P.O.:600; I.V.:520; IV Piggyback:50] Out: -  Intake/Output from this shift:    Labs:  Recent Labs  09/12/14 0500 09/12/14 0501 09/13/14 0534 09/14/14 0950  WBC 5.7  --  6.7 7.4  HGB 7.8*  --  8.0* 8.1*  PLT 349  --  359 385  CREATININE  --  7.98* 5.34*  5.22* 6.22*   Estimated Creatinine Clearance: 11.3 mL/min (by C-G formula based on Cr of 6.22). No results for input(s): VANCOTROUGH, VANCOPEAK, VANCORANDOM, GENTTROUGH, GENTPEAK, GENTRANDOM, TOBRATROUGH, TOBRAPEAK, TOBRARND, AMIKACINPEAK, AMIKACINTROU, AMIKACIN in the last 72 hours.   Microbiology: Recent Results (from the past 720 hour(s))  Culture, blood (routine x 2)     Status: None   Collection Time: 08/15/14  1:15 PM  Result Value Ref Range Status   Specimen Description BLOOD RIGHT HAND  Final   Special Requests BOTTLES DRAWN AEROBIC ONLY 3CC  Final   Culture   Final    NO GROWTH 5 DAYS Performed at [facility]    Report Status 08/21/2014 FINAL  Final  Culture, blood (routine x 2)     Status: None   Collection Time: 08/15/14  3:11 PM  Result Value Ref Range Status   Specimen Description BLOOD RIGHT HAND  Final   Special Requests   Final    BOTTLES DRAWN AEROBIC AND ANAEROBIC BLUE 10CC RED 5CC   Culture   Final    NO GROWTH 5 DAYS Performed at [facility]    Report Status 08/21/2014 FINAL  Final  Culture, Urine     Status: None   Collection Time: 08/17/14  3:14 PM  Result Value Ref Range Status   Specimen Description URINE, CATHETERIZED  Final   Special Requests NONE  Final   Colony Count NO GROWTH Performed at [facility]   Final   Culture NO GROWTH Performed at [facility]   Final   Report Status 08/18/2014 FINAL  Final  Body fluid culture     Status: None   Collection Time: 08/18/14  4:52 PM  Result Value Ref Range Status   Specimen Description FLUID SYNOVIAL LEFT KNEE  Final   Special Requests Normal  Final   Gram Stain   Final    MODERATE WBC PRESENT,BOTH PMN AND MONONUCLEAR NO ORGANISMS SEEN Performed at [facility]    Culture   Final    NO GROWTH 3 DAYS Performed at [facility]    Report Status 08/22/2014 FINAL  Final  Culture, Urine     Status: None   Collection Time: 09/02/14  3:04 PM  Result Value Ref Range Status   Specimen Description URINE, CATHETERIZED  Final   Special Requests NONE  Final   Colony Count   Final    >=100,000 COLONIES/ML Performed at [facility]    Culture   Final  PSEUDOMONAS AERUGINOSA Performed at [facility]    Report Status 09/05/2014 FINAL  Final   Organism ID, Bacteria PSEUDOMONAS AERUGINOSA  Final      Susceptibility   Pseudomonas aeruginosa - MIC*    CEFEPIME 2 SENSITIVE Sensitive     CEFTAZIDIME 4 SENSITIVE Sensitive     CIPROFLOXACIN 0.5 SENSITIVE Sensitive     GENTAMICIN <=1 SENSITIVE Sensitive     IMIPENEM >=16 RESISTANT Resistant     PIP/TAZO 8 SENSITIVE Sensitive     TOBRAMYCIN <=1 SENSITIVE Sensitive     * PSEUDOMONAS AERUGINOSA  Surgical pcr screen     Status: None   Collection Time: 09/13/14  1:12 AM  Result Value Ref Range Status   MRSA, PCR NEGATIVE NEGATIVE Final   Staphylococcus aureus NEGATIVE NEGATIVE Final    Comment:        The Xpert SA Assay (FDA approved for NASAL specimens in  patients over 87 years of age), is one component of a comprehensive surveillance program.  Test performance has been validated by [facility] for patients greater than or equal to 50 year old. It is not intended to diagnose infection nor to guide or monitor treatment.     Anti-infectives    Start     Dose/Rate Route Frequency Ordered Stop   09/13/14 0600  cefUROXime (ZINACEF) 1.5 g in dextrose 5 % 50 mL IVPB  Status:  Discontinued     1.5 g 100 mL/hr over 30 Minutes Intravenous On call to O.R. 09/12/14 1120 09/12/14 1141   09/05/14 2000  ciprofloxacin (CIPRO) tablet 500 mg  Status:  Discontinued     500 mg Oral Every 24 hours 09/05/14 1820 09/10/14 1031   09/04/14 1100  levofloxacin (LEVAQUIN) tablet 250 mg  Status:  Discontinued    Comments:  For UTI   250 mg Oral Daily 09/04/14 1019 09/05/14 1803   09/02/14 1800  ceFAZolin (ANCEF) IVPB 1 g/50 mL premix     1 g 100 mL/hr over 30 Minutes Intravenous Every 24 hours 09/01/14 1457 10/05/14 2359   08/29/14 0900  ceFAZolin (ANCEF) IVPB 2 g/50 mL premix     2 g 100 mL/hr over 30 Minutes Intravenous  Once 08/29/14 0854 08/29/14 0925   08/29/14 0802  ceFAZolin (ANCEF) 2-3 GM-% IVPB SOLR  Status:  Discontinued    Comments:  Laughlin, Amy   : cabinet override      08/29/14 0802 08/29/14 0815   08/28/14 2200  ceFAZolin (ANCEF) IVPB 2 g/50 mL premix  Status:  Discontinued     2 g 100 mL/hr over 30 Minutes Intravenous Every M-W-F (Hemodialysis) 08/28/14 1501 09/01/14 1457   08/28/14 1200  ceFAZolin (ANCEF) IVPB 2 g/50 mL premix  Status:  Discontinued    Comments:  On call to xray 3/14   2 g 100 mL/hr over 30 Minutes Intravenous On call 08/28/14 0924 08/28/14 1446   08/23/14 2200  fluconazole (DIFLUCAN) tablet 150 mg     150 mg Oral  Once 08/23/14 2151 08/23/14 2252   08/19/14 0500  ceFAZolin (ANCEF) IVPB 2 g/50 mL premix  Status:  Discontinued     2 g 100 mL/hr over 30 Minutes Intravenous Every 12 hours 08/18/14 1524 08/28/14 1501    08/18/14 1800  ceFAZolin (ANCEF) IVPB 2 g/50 mL premix  Status:  Discontinued     2 g 100 mL/hr over 30 Minutes Intravenous Every 12 hours 08/18/14 1517 08/18/14 1524   08/18/14 1330  ceFAZolin (ANCEF) 2-3 GM-% IVPB  SOLR    Comments:  Barley, Jenny   : cabinet override      08/18/14 1330 08/19/14 0144   08/18/14 0600  ceFAZolin (ANCEF) IVPB 1 g/50 mL premix  Status:  Discontinued     1 g 100 mL/hr over 30 Minutes Intravenous Every 12 hours 08/17/14 1822 08/18/14 1517   08/16/14 1800  ceFAZolin (ANCEF) IVPB 2 g/50 mL premix  Status:  Discontinued     2 g 100 mL/hr over 30 Minutes Intravenous Every 12 hours 08/16/14 1355 08/17/14 1822   08/15/14 1200  nafcillin 2 g in dextrose 5 % 50 mL IVPB  Status:  Discontinued     2 g 100 mL/hr over 30 Minutes Intravenous 6 times per day 08/15/14 1028 08/16/14 1253   08/14/14 1400  ceFAZolin (ANCEF) IVPB 2 g/50 mL premix  Status:  Discontinued     2 g 100 mL/hr over 30 Minutes Intravenous 3 times per day 08/14/14 0850 08/15/14 1028   08/12/14 1800  vancomycin (VANCOCIN) 500 mg in sodium chloride 0.9 % 100 mL IVPB  Status:  Discontinued     500 mg 100 mL/hr over 60 Minutes Intravenous Every 12 hours 08/12/14 0705 08/12/14 0721   08/12/14 1800  vancomycin (VANCOCIN) IVPB 750 mg/150 ml premix  Status:  Discontinued     750 mg 150 mL/hr over 60 Minutes Intravenous Every 12 hours 08/12/14 0721 08/12/14 1315   08/10/14 2138  vancomycin (VANCOCIN) powder  Status:  Discontinued       As needed 08/10/14 2138 08/10/14 2153   08/10/14 1600  cefTRIAXone (ROCEPHIN) 2 g in dextrose 5 % 50 mL IVPB - Premix  Status:  Discontinued     2 g 100 mL/hr over 30 Minutes Intravenous Every 24 hours 08/09/14 1555 08/10/14 1432   08/10/14 1600  ceFAZolin (ANCEF) IVPB 2 g/50 mL premix  Status:  Discontinued     2 g 100 mL/hr over 30 Minutes Intravenous 3 times per day 08/10/14 1432 08/14/14 0845   08/10/14 0600  vancomycin (VANCOCIN) IVPB 750 mg/150 ml premix  Status:   Discontinued     750 mg 150 mL/hr over 60 Minutes Intravenous Every 12 hours 08/09/14 2018 08/12/14 0705   08/09/14 1800  vancomycin (VANCOCIN) 2,000 mg in sodium chloride 0.9 % 500 mL IVPB     2,000 mg 250 mL/hr over 120 Minutes Intravenous  Once 08/09/14 1616 08/09/14 1929   08/09/14 1630  cefTRIAXone (ROCEPHIN) 2 g in dextrose 5 % 50 mL IVPB - Premix     2 g 100 mL/hr over 30 Minutes Intravenous  Once 08/09/14 1553 08/09/14 1653      Assessment: 70 yoF admitted with intractable shoulder pain, 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 Patient continues on Ancef, planning for 8 weeks of therapy (end date 4/21). Patient with ARF, started on HD this admission with inconsistent schedule, adjusted Ancef to renal dosing. Now deemed ESRD, s/p AVG creation on 3/30 for long-term HD scheduled MWF. Last HD session on 3/31, will receive HD on 4/1 outpatient. For ease of administration and now CLIPed to a schedule, will change to qHD dosing. Patient remains afebrile, WBC wnl.  Goal of Therapy:  Eradication of infection  Plan:  -Ancef 1 g IV x1 tonight, then change to 2g IV qMWF starting 4/1. -Monitor clinical progress  Jeanelle Malling 09/14/2014,12:05 PM

## 2014-09-14 NOTE — Procedures (Signed)
I have personally attended this patient's dialysis session.   2K bath K 4.6 TDC 400  Tolerating UF goal of 4 liters very well New left AVG (3/30) with + bruit.  Some arm edema and no steal sx. Hb 8.1 Aranesp 200 QTuesday  Jamal Maes, MD [facility] [phone] Pager 09/14/2014, 11:58 AM

## 2014-09-14 NOTE — H&P (View-Only) (Signed)
Physical Medicine and Rehabilitation Admission H&P    Chief Complaint  Patient presents with  . Disseminated MSSA bacteremia with septic arthritis bilateral knees and bilateral shoulders   HPI: [name] is a  59yo AAF with PMH sig for poorly controlled DM, HTN, CKD (baseline Scr 1.3-1.6) and new diagnosis of Hep C infection who was admitted to [facility] 08/07/14 with left shoulder pain. She had an elevated D-Dimer and underwent CT-angio to r/o PE on 08/07/14 and was admitted for further evaluation. work up revealed MSSA bacteremia with septic arthritis of bilateral AC joints, bilateral knees and right hand flexor tendon sheath. She underwent I and D  bilateral knees and I and D with drainage of abscess of bilateral shoulder by Dr. [name] on 08/10/14. She has required repeat I and D left knee on 02/26 and 03/05.  Dr. Kathrynn Speed consulted for right hand and forearm infection and patient underwent I and D of right flexor tendon sheath and right volar forearm on 02/27. 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 UOP remains poor and kidneys without recovery despite treatment of infection and patient has been dialysis dependent since 08/20/14.   TEE negative for endocarditis. Antibiotics narrowed to Ancef and patient to continue for 8 weeks duration using 2/26 as day 1/56 per Dr. Baxter Flattery. She will need treatment for HCV past completing her antibiotic regimen for MSSA infection.   She had febrile episode on 03/19 due to pseudomonas UTI and was treated with cipro X 5 days. Anemia of chronic disease treated with iron load as well as aranesp with recommendations for transfusion prn Hgb < 7.0.  AVG placed by Dr. Kellie Simmering on 03/30 and patient has had significant pain LUE with hypersensitivity affecting mobility. Po intake remains poor with decreased  energy levels. Therapy ongoing and CIR recommended due to deconditioned state.    Review of Systems  Constitutional: Negative for fever.  Respiratory: Negative for cough and wheezing.   Cardiovascular: Negative for chest pain.  Musculoskeletal: Positive for myalgias and joint pain.  Neurological: Positive for sensory change and focal weakness.      Past Medical History  Diagnosis Date  . Diabetes mellitus   . Hypertension   . Peripheral neuropathy     Past Surgical History  Procedure Laterality Date  . Cholecystectomy    . Appendectomy    . Small intestine surgery      Due to Small Bowel Obstruction  . Tubal ligation    . Knee arthroscopy Right 08/10/2014    Procedure: ARTHROSCOPY I & D KNEE;  Surgeon: Marianna Payment, MD;  Location: [facility];  Service: Orthopedics;  Laterality: Right;  . Shoulder arthroscopy Bilateral 08/10/2014    Procedure: I & D BILATERAL SHOULDERS ;  Surgeon: Marianna Payment, MD;  Location: [facility];  Service: Orthopedics;  Laterality: Bilateral;  . Tenosynovectomy Right 08/11/2014    Procedure: RIGHT WRIST IRRIGATION AND DEBRIDEMENT, TENOSYNOVECTOMY;  Surgeon: Marianna Payment, MD;  Location: [place];  Service: Orthopedics;  Laterality: Right;  . Knee arthroscopy Left 08/11/2014    Procedure: ARTHROSCOPIC WASHOUT LEFT KNEE;  Surgeon: Marianna Payment, MD;  Location: [place];  Service: Orthopedics;  Laterality: Left;  . Tee without cardioversion N/A 08/14/2014    Procedure: TRANSESOPHAGEAL ECHOCARDIOGRAM (TEE);  Surgeon: Thayer Headings, MD;  Location: [place];  Service: Cardiovascular;  Laterality: N/A;  . Knee arthroscopy Left 08/19/2014    Procedure: ARTHROSCOPIC WASHOUT LEFT KNEE;  Surgeon: Leandrew Koyanagi, MD;  Location: [place];  Service: Orthopedics;  Laterality: Left;    Family History  Problem Relation Age of Onset  . Cancer Mother   . Heart disease Father   . Cancer Sister   . Cancer Brother   . Diabetes Brother     Social History:  reports  that she has been smoking.  She has never used smokeless tobacco. She reports that she drinks alcohol. She reports that she does not use illicit drugs.    Allergies  Allergen Reactions  . Compazine [Prochlorperazine] Shortness Of Breath and Swelling    TONGUE SWELLS  . Omnipaque [Iohexol] Hives  . Shellfish-Derived Products Anaphylaxis  . Iodinated Diagnostic Agents Rash    Medications Prior to Admission  Medication Sig Dispense Refill  . aspirin 81 MG chewable tablet Chew 81-162 mg by mouth daily as needed (For stroke prevention.).     [name] Blood Glucose Monitoring Suppl (ON CALL EXPRESS GLUCOSE METER) DEVI Use as directed 1 Device 0  . glipiZIDE (GLUCOTROL) 10 MG tablet Take 1 tablet (10 mg total) by mouth 2 (two) times daily before a meal. 60 tablet 3  . glucose blood test strip Use as instructed 100 each 12  . hydrALAZINE (APRESOLINE) 25 MG tablet Take 1 tablet (25 mg total) by mouth 3 (three) times daily. 90 tablet 3  . Menthol-Methyl Salicylate (MUSCLE RUB) 10-15 % CREA Apply 1 application topically daily as needed for muscle pain.    . TRUEPLUS LANCETS 30G MISC Use as directed 100 each 2  . aspirin EC 81 MG tablet Take 1 tablet (81 mg total) by mouth daily as needed for pain. 30 tablet 3    Home: Home Living Family/patient expects to be discharged to:: Private residence Living Arrangements: Spouse/significant other Available Help at Discharge: Family, Available PRN/intermittently (will be alone during day) Type of Home: House Home Access: Stairs to enter Entrance Stairs-Number of Steps: 4 Entrance Stairs-Rails: None Home Layout: One level Home Equipment: None   Functional History: Prior Function Level of Independence: Independent  Functional Status:  Mobility: Bed Mobility Overal bed mobility: Modified Independent Bed Mobility: Supine to Sit Sidelying to sit: Min assist Supine to sit: Modified independent (Device/Increase time) Sit to supine: Mod assist, +2 for  physical assistance General bed mobility comments: mod I from supine to sit at EOB with use of bedrail to assist self Transfers Overall transfer level: Needs assistance Equipment used: Rolling walker (2 wheeled) Transfers: Sit to/from Stand Sit to Stand: Min assist, +2 physical assistance, Mod assist Stand pivot transfers: Min assist, +2 safety/equipment General transfer comment: Pt. needed +2 min assist to rise to stand from bed; needed mod assist for controlled descent to recliner as pt. tends to sit with little control; noted pt is quite dependent on momentum for successful sit to stand, indicative of weakness Ambulation/Gait Ambulation/Gait assistance: Min assist Ambulation Distance (Feet): 75 Feet (one seated rest break) Assistive device: Rolling walker (2 wheeled) Gait Pattern/deviations: Step-to pattern, Trunk flexed, Narrow base of support, Ataxic Gait velocity: Decreased Gait velocity interpretation: Below normal speed for age/gender General Gait Details: vc's for posture' min assist for safety and stability due to her unsteadiness and second person closely following with recliner chair    ADL:    Cognition: Cognition Overall Cognitive Status: Within Functional Limits for tasks assessed Orientation Level: Oriented X4 Cognition Arousal/Alertness:  Awake/alert Behavior During Therapy: WFL for tasks assessed/performed Overall Cognitive Status: Within Functional Limits for tasks assessed    Blood pressure 144/64, pulse 98, temperature 98.5 F (36.9 C), temperature source Oral, resp. rate 18, height _0  (1.702 m), weight 92.2 kg (203 lb 4.2 oz), last menstrual period 03/16/2002, SpO2 92 %. Physical Exam  Nursing note and vitals reviewed. Constitutional: She is oriented to person, place, and time. She appears well-developed and well-nourished.  HENT:  Head: Normocephalic and atraumatic.  Eyes: Conjunctivae are normal. Pupils are equal, round, and reactive to light.  Neck:  Normal range of motion. Neck supple.  Cardiovascular: Normal rate, regular rhythm and normal heart sounds.   Respiratory: Effort normal and breath sounds normal.  GI: Soft. Bowel sounds are normal.  Musculoskeletal: She exhibits edema (1+ edema L-forearm with allodynia. ).  Healing incisions bilateral knees with edema L>R. Left knee with pain on ROM. Moves RUE without difficulty. LUE limited due to recent surgery.  Mild edema noted  Neurological: She is alert and oriented to person, place, and time.  Skin: Skin is warm and dry.  Psychiatric: Her speech is normal. Judgment and thought content normal. Her mood appears anxious. Her affect is blunt.    Results for orders placed or performed during the hospital encounter of 08/07/14 (from the past 48 hour(s))   Collection Time: 09/13/14  7:29 AM  Result Value Ref Range   Glucose-Capillary 106 (H) 70 - 99 mg/dL  Glucose, capillary     Status: Abnormal   Collection Time: 09/13/14 10:27 AM  Result Value Ref Range   Glucose-Capillary 111 (H) 70 - 99 mg/dL  Glucose, capillary     Status: None   Collection Time: 09/13/14  1:01 PM  Result Value Ref Range   Glucose-Capillary 93 70 - 99 mg/dL   Comment 1 Notify RN   Glucose, capillary     Status: Abnormal   Collection Time: 09/13/14  5:04 PM  Result Value Ref Range   Glucose-Capillary 108 (H) 70 - 99 mg/dL  Glucose, capillary     Status: Abnormal   Collection Time: 09/13/14  9:16 PM  Result Value Ref Range   Glucose-Capillary 100 (H) 70 - 99 mg/dL  CBC     Status: Abnormal   Collection Time: 09/14/14  9:50 AM  Result Value Ref Range   WBC 7.4 4.0 - 10.5 K/uL   RBC 2.77 (L) 3.87 - 5.11 MIL/uL   Hemoglobin 8.1 (L) 12.0 - 15.0 g/dL   HCT 26.3 (L) 36.0 - 46.0 %   MCV 94.9 78.0 - 100.0 fL   MCH 29.2 26.0 - 34.0 pg   MCHC 30.8 30.0 - 36.0 g/dL   RDW 15.4 11.5 - 15.5 %   Platelets 385 150 - 400 K/uL  Renal function panel     Status: Abnormal   Collection Time: 09/14/14  9:50 AM  Result Value  Ref Range   Sodium 136 135 - 145 mmol/L   Potassium 4.6 3.5 - 5.1 mmol/L   Chloride 101 96 - 112 mmol/L   CO2 26 19 - 32 mmol/L   Glucose, Bld 109 (H) 70 - 99 mg/dL   BUN 15 6 - 23 mg/dL   Creatinine, Ser 6.22 (H) 0.50 - 1.10 mg/dL   Calcium 8.0 (L) 8.4 - 10.5 mg/dL   Phosphorus 4.7 (H) 2.3 - 4.6 mg/dL   Albumin 1.4 (L) 3.5 - 5.2 g/dL   GFR calc non Af Amer 7 (L) >90 mL/min  GFR calc Af Amer 8 (L) >90 mL/min    Comment: (NOTE) The eGFR has been calculated using the CKD EPI equation. This calculation has not been validated in all clinical situations. eGFR's persistently <90 mL/min signify possible Chronic Kidney Disease.    Anion gap 9 5 - 15   No results found.     Medical Problem List and Plan: 1. 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  DVT Prophylaxis/Anticoagulation: Pharmaceutical: Lovenox 3. Pain Management: Will change IV dilaudid to oxycodone 10 mg prn. Discussed need to transition to oral meds and will add xanax to help with anxiety issues.  4. Adjustment reaction/ Mood: Team to provide ego support. Patient not interested in antidepressant at this time. LCSW to follow for evaluation and support.  5. Neuropsych: This patient is capable of making decisions on her own behalf. 6. Skin/Wound Care: Routine pressure relief measures. Elevate LUE when in bed to help with edema control 7. Fluids/Electrolytes/Nutrition: Needs 1200 FR due to ESRD.  Routine check of lytes with HD. 8. ESRD: Schedule HD past therapy sessions to help with tolerance and endurance.  on 9. Septic arthritis:  To continue  Ancef D# 97/98 92. DM type 2 with neuropathy: Resume gabapentin as complaining of significant neuropathic symptoms BLE. Continue to monitor BS ac/hs basis and use SSI for elevated BS. Off glipizide and currently diet controlled as po intake is poor. Will add nephro for supplement.  11.  HTN: Monitor every 8 hours. Continue  Hydralazine bid.      Post Admission Physician Evaluation: 1. Functional deficits secondary  to  Septic joints due to MSSA bacteremia progressing to ESRD/debility 2. Patient is admitted to receive collaborative, interdisciplinary care between the physiatrist, rehab nursing staff, and therapy team. 3. Patient's level of medical complexity and substantial therapy needs in context of that medical necessity cannot be provided at a lesser intensity of care such as a SNF. 4. Patient has experienced substantial functional loss from his/her baseline which was documented above under the "Functional History" and "Functional Status" headings.  Judging by the patient's diagnosis, physical exam, and functional history, the patient has potential for functional progress which will result in measurable gains while on inpatient rehab.  These gains will be of substantial and practical use upon discharge  in facilitating mobility and self-care at the household level. 5. Physiatrist will provide 24 hour management of medical needs as well as oversight of the therapy plan/treatment and provide guidance as appropriate regarding the interaction of the two. 6. 24 hour rehab nursing will assist with bladder management, bowel management, safety, skin/wound care, disease management, medication administration, pain management and patient education  and help integrate therapy concepts, techniques,education, etc. 7. PT will assess and treat for/with: Lower extremity strength, range of motion, stamina, balance, functional mobility, safety, adaptive techniques and equipment, pain mgt, id precautions, ego support, community reintegration.   Goals are: mod I. 8. OT will assess and treat for/with: ADL's, functional mobility, safety, upper extremity strength, adaptive techniques and equipment, pain mgt, wound consideration, ego support, leisure awareness.   Goals are: mod I. Therapy may not yet proceed with showering this patient. 9. SLP  will assess and treat for/with: n/a.  Goals are: n/a. 10. Case Management and Social Worker will assess and treat for psychological issues and discharge planning. 11. Team conference will be held weekly to assess progress toward goals and to determine barriers to discharge. 12. Patient will receive at least  3 hours of therapy per day at least 5 days per week. 13. ELOS: 10-16 days       14. Prognosis:  excellent     Meredith Staggers, MD, [place] [facility] 09/14/2014   09/14/2014

## 2014-09-14 NOTE — Progress Notes (Signed)
Meredith Staggers, MD Physician Signed Physical Medicine and Rehabilitation Consult Note 08/14/2014 7:23 AM  Related encounter: ED to Hosp-Admission (Current) from 08/07/2014 in [place] Collapse All   Physical Medicine and Rehabilitation Consult Reason for Consult: MSSA bacteremia with polyarticular septic arthritis Referring Phsyician: [facility]   HPI: Brandy Houston is an 59 y.o. right handed female. With history of hypertension as well as diabetes mellitus peripheral neuropathy. Admitted 08/07/2014 with complaints of acute left shoulder pain. Patient works in Librarian, academic at [employer]. By report she was lifting a large bag when she developed acute onset of left shoulder pain. X-rays of left shoulder showed no acute osseous abnormality. Noted troponin of 0.09. She denied any chest pain. EKG negative for ST segment changes. Noted elevated d-dimer with CT angiogram of the chest showing no embolism. MRI of the left shoulder showed abnormal appearance of the acromioclavicular joint highly suspicious for septic joint with surrounding cellulitis and myositis. Patient developed low-grade fever 100.6 as well as leukocytosis 12,500-18,200. Infectious disease consulted 08/09/2014 and placed on broad-spectrum antibiotics. Underwent aspiration of shoulder per interventional radiology 08/10/2014. Blood cultures showing to sets with [facility] as well as noted increased fever to 101.8 and now with increasing right shoulder pain and knee pain. Suspect bacteremia/polyarticular septic arthritis and underwent arthroscopic irrigation and debridement of multiple joints including right knee, right acromioclavicular joint, right shoulder and left acromioclavicular joint 08/10/2014 per Dr. [name] followed by extensive debridement in 3 compartments of left knee 08/11/2014. TEE is pending. Question of right hand and forearm infection with infectious flexor tenosynovitis and  underwent irrigation debridement proximal middle phalanx of the middle finger 08/12/2014 per Dr. Ninfa Linden. Await planned by infectious disease for duration of antibiotic therapy. Subcutaneous heparin for DVT prophylaxis. Hospital course pain management. Physical therapy evaluation completed 08/13/2014 with recommendations of physical medicine rehabilitation consult.  Review of Systems  Gastrointestinal: Positive for constipation.  Musculoskeletal: Positive for myalgias and joint pain.  Neurological: Positive for weakness.  All other systems reviewed and are negative.  Past Medical History  Diagnosis Date  . Diabetes mellitus   . Hypertension   . Peripheral neuropathy    Past Surgical History  Procedure Laterality Date  . Cholecystectomy    . Appendectomy    . Small intestine surgery      Due to Small Bowel Obstruction  . Tubal ligation    . Knee arthroscopy Right 08/10/2014    Procedure: ARTHROSCOPY I & D KNEE; Surgeon: Marianna Payment, MD; Location: [facility]S; Service: Orthopedics; Laterality: Right;  . Shoulder arthroscopy Bilateral 08/10/2014    Procedure: I & D BILATERAL SHOULDERS ; Surgeon: Marianna Payment, MD; Location: [facility] OR[facility]; Service: Orthopedics; Laterality: Bilateral;   Family History  Problem Relation Age of Onset  . Cancer Mother   . Heart disease Father   . Cancer Sister   . Cancer Brother   . Diabetes Brother    Social History:  reports that she has been smoking. She has never used smokeless tobacco. She reports that she drinks alcohol. She reports that she does not use illicit drugs.  Allergies  Allergen Reactions  . Compazine [Prochlorperazine] Shortness Of Breath and Swelling    TONGUE SWELLS  . Omnipaque [Iohexol] Hives  . Shellfish-Derived Products Anaphylaxis  . Iodinated Diagnostic Agents Rash    Prior to Admission medications   Medication Sig  Start Date End Date Taking? Authorizing Provider  aspirin 81 MG chewable tablet  Chew 81-162 mg by mouth daily as needed (For stroke prevention.).    Yes Historical Provider, MD  Blood Glucose Monitoring Suppl (ON CALL EXPRESS GLUCOSE METER) DEVI Use as directed 01/25/14  Yes Lance Bosch, NP  glipiZIDE (GLUCOTROL) 10 MG tablet Take 1 tablet (10 mg total) by mouth 2 (two) times daily before a meal. 02/27/14  Yes Lance Bosch, NP  glucose blood test strip Use as instructed 01/25/14  Yes Lance Bosch, NP  hydrALAZINE (APRESOLINE) 25 MG tablet Take 1 tablet (25 mg total) by mouth 3 (three) times daily. 02/27/14  Yes Lance Bosch, NP  Menthol-Methyl Salicylate (MUSCLE RUB) 10-15 % CREA Apply 1 application topically daily as needed for muscle pain.   Yes Historical Provider, MD  TRUEPLUS LANCETS 30G MISC Use as directed 01/25/14  Yes Lance Bosch, NP  aspirin EC 81 MG tablet Take 1 tablet (81 mg total) by mouth daily as needed for pain. 04/14/13   Shanker Kristeen Mans, MD   Scheduled Medications: . antiseptic oral rinse 7 mL Mouth Rinse q12n4p  . aspirin 81 mg Oral Daily  . ceFAZolin (ANCEF) IV 2 g Intravenous 3 times per day  . chlorhexidine 15 mL Mouth Rinse BID  . cyclobenzaprine 5 mg Oral BID  . famotidine 20 mg Oral BID  . ferrous gluconate 324 mg Oral Q breakfast  . heparin subcutaneous 5,000 Units Subcutaneous 3 times per day  . hydrALAZINE 25 mg Oral TID  . insulin aspart 0-9 Units Subcutaneous TID WC  . nicotine 21 mg Transdermal Daily  . polyethylene glycol 17 g Oral BID  . sodium chloride 3 mL Intravenous Q12H   PRN MED's: acetaminophen, HYDROcodone-acetaminophen, HYDROmorphone (DILAUDID) injection, promethazine Home: Home Living Family/patient expects to be discharged to:: Private residence Living Arrangements: Children Available Help at Discharge:  Family, Available PRN/intermittently (will be alone during day) Type of Home: House Home Access: Stairs to enter [employer] of Steps: 4 Entrance Stairs-Rails: None Home Layout: One level Home Equipment: None  Functional History:   Functional Status:  Mobility:          ADL:    Cognition: Cognition Overall Cognitive Status: Within Functional Limits for tasks assessed Orientation Level: Oriented X4 Cognition Arousal/Alertness: Awake/alert Behavior During Therapy: WFL for tasks assessed/performed Overall Cognitive Status: Within Functional Limits for tasks assessed  Blood pressure 161/72, pulse 110, temperature 100.3 F (37.9 C), temperature source Oral, resp. rate 18, height 5' 7.5" (1.715 m), weight 90.22 kg (198 lb 14.4 oz), last menstrual period 03/16/2002, SpO2 95 %. Physical Exam  Constitutional: She appears well-developed.  HENT:  Head: Normocephalic.  Eyes: EOM are normal.  Neck: Normal range of motion. Neck supple. No thyromegaly present.  Cardiovascular: Normal rate and regular rhythm.  Respiratory: Effort normal and breath sounds normal. No respiratory distress.  GI: Soft. Bowel sounds are normal. She exhibits no distension.  Neurological: She is alert.  Patient appears anxious and is moaning during exam. She is able to provide her name, age, date of birth and follow simple commands  Skin: Skin is warm and dry.  Psychiatric: She has a normal mood and affect. Her behavior is normal.     Lab Results Last 24 Hours    Results for orders placed or performed during the hospital encounter of 08/07/14 (from the past 24 hour(s))  CBC Status: Abnormal   Collection Time: 08/13/14 8:09 AM  Result Value Ref Range   WBC 19.1 (H) 4.0 - 10.5 K/uL  RBC 3.21 (L) 3.87 - 5.11 MIL/uL   Hemoglobin 9.9 (L) 12.0 - 15.0 g/dL   HCT 29.6 (L) 36.0 - 46.0 %   MCV 92.2 78.0 - 100.0 fL   MCH 30.8 26.0 - 34.0 pg   MCHC 33.4  30.0 - 36.0 g/dL   RDW 13.3 11.5 - 15.5 %   Platelets 308 150 - 400 K/uL  Basic metabolic panel Status: Abnormal   Collection Time: 08/13/14 8:09 AM  Result Value Ref Range   Sodium 133 (L) 135 - 145 mmol/L   Potassium 3.4 (L) 3.5 - 5.1 mmol/L   Chloride 98 96 - 112 mmol/L   CO2 24 19 - 32 mmol/L   Glucose, Bld 164 (H) 70 - 99 mg/dL   BUN 23 6 - 23 mg/dL   Creatinine, Ser 1.35 (H) 0.50 - 1.10 mg/dL   Calcium 8.2 (L) 8.4 - 10.5 mg/dL   GFR calc non Af Amer 42 (L) >90 mL/min   GFR calc Af Amer 49 (L) >90 mL/min   Anion gap 11 5 - 15  Glucose, capillary Status: Abnormal   Collection Time: 08/13/14 8:38 AM  Result Value Ref Range   Glucose-Capillary 166 (H) 70 - 99 mg/dL  Glucose, capillary Status: Abnormal   Collection Time: 08/13/14 12:35 PM  Result Value Ref Range   Glucose-Capillary 202 (H) 70 - 99 mg/dL  Glucose, capillary Status: Abnormal   Collection Time: 08/13/14 4:54 PM  Result Value Ref Range   Glucose-Capillary 178 (H) 70 - 99 mg/dL  Glucose, capillary Status: Abnormal   Collection Time: 08/13/14 10:15 PM  Result Value Ref Range   Glucose-Capillary 166 (H) 70 - 99 mg/dL   Comment 1 Notify RN       Imaging Results (Last 48 hours)    No results found.    Assessment/Plan: Diagnosis: septic joints, debility 1. Does the need for close, 24 hr/day medical supervision in concert with the patient's rehab needs make it unreasonable for this patient to be served in a less intensive setting? Yes 2. Co-Morbidities requiring supervision/potential complications: CKD, SIRS 3. Due to bladder management, bowel management, safety, skin/wound care, disease management, medication administration, pain management and patient education, does the patient require 24 hr/day rehab nursing? Yes 4. Does the patient require coordinated care of a physician, rehab nurse,  PT (1-2 hrs/day, 5 days/week) and OT (1-2 hrs/day, 5 days/week) to address physical and functional deficits in the context of the above medical diagnosis(es)? Yes Addressing deficits in the following areas: balance, endurance, locomotion, strength, transferring, bowel/bladder control, bathing, dressing, feeding, grooming and toileting 5. Can the patient actively participate in an intensive therapy program of at least 3 hrs of therapy per day at least 5 days per week? Potentially 6. The potential for patient to make measurable gains while on inpatient rehab is good 7. Anticipated functional outcomes upon discharge from inpatients are ?supervision PT, ?supervision OT, n/aSLP 8. Estimated rehab length of stay to reach the above functional goals is: to be determined 9. Does the patient have adequate social supports to accommodate these discharge functional goals? Potentially 10. Anticipated D/C setting: Home 11. Anticipated post D/C treatments: HH therapy and Outpatient therapy 12. Overall Rehab/Functional Prognosis: good  RECOMMENDATIONS: This patient's condition is appropriate for continued rehabilitative care in the following setting: CIR vs Home health---Will follow along for therapy notes.  Patient has agreed to participate in recommended program. Potentially Note that insurance prior authorization may be required for reimbursement for recommended care.  Comment:  Meredith Staggers, MD, [facility] 08/14/2014    Cathlyn Parsons. 08/14/2014        Revision History     Date/Time User Provider Type Action   08/14/2014 9:34 AM Meredith Staggers, MD Physician Sign   08/14/2014 7:43 AM Cathlyn Parsons, PA-C Physician Assistant Pend   View Details Report       Routing History     Date/Time From To Method   08/14/2014 9:34 AM Meredith Staggers, MD Meredith Staggers, MD In Basket   08/14/2014 9:34 AM Meredith Staggers, MD Lance Bosch,  NP In [facility]

## 2014-09-14 NOTE — Progress Notes (Addendum)
  Postoperative hemodialysis access     Date of Surgery:  09/13/14 Surgeon: Kellie Simmering  Subjective:  C/o arm swelling; denies any pain or numbness in her fingers/hand.  PHYSICAL EXAMINATION:  Filed Vitals:   09/14/14 0930  BP: 156/77  Pulse: 86  Temp:   Resp:     Incision is c/d/i Sensation in digits is intact;  There is  Thrill  There is bruit. Mild swelling left forearm The graft/fistula is palpable    ASSESSMENT/PLAN:  Brandy Houston is a 59 y.o. year old female who is s/p left forearm loop graft 09/13/14.  -graft/fistula is patent -pt does not have evidence of steal sx -f/u with [facility] as needed -may use graft in 4 weeks.  After 3 successful HD runs, diatek catheter may be removed. -will sign off-call as needed.   Leontine Locket, PA-C [facility] [phone]

## 2014-09-14 NOTE — Progress Notes (Signed)
PT Cancellation Note  Patient Details Name: Brandy Houston MRN: [id number] DOB: [birth year]   Cancelled Treatment:    Reason Eval/Treat Not Completed: Fatigue/lethargy limiting ability to participate. Pt was in HD this morning, and was very fatigued after lunch. Pt initially agreeable and attempted to move her legs to EOB; became very fatigued and then declined moving any further. Will hold for today and attempt again tomorrow.    Rolinda Roan 09/14/2014, 1:57 PM   Rolinda Roan, PT, DPT Acute Rehabilitation Services Pager: [phone]

## 2014-09-14 NOTE — Progress Notes (Signed)
Retta Diones, RN Rehab Admission Coordinator Signed Physical Medicine and Rehabilitation PMR Pre-admission 08/14/2014 2:37 PM  Related encounter: ED to Hosp-Admission (Current) from 08/07/2014 in [place]   PMR Admission Coordinator Pre-Admission Assessment  Patient: Brandy Houston is an 59 y.o., female MRN: [id number] DOB: [birth year] Height: 5\' 7"  (170.2 cm) Weight: 92.2 kg (203 lb 4.2 oz)  Insurance Information Self pay - no insurance. Has an orange card for health care needs.  Medicaid Application Date: Case Manager:  Disability Application Date: Case Worker:   Emergency Contact Information Contact Information    Name Relation Home Work Mobile   [place] Son [phone]     S[place],[name] Daughter   [phone]     Current Medical History  Patient Admitting Diagnosis: Septic Joints and debility   History of Present Illness: A 59yo AAF with PMH sig for poorly controlled DM, HTN, CKD (baseline Scr 1.3-1.6) and new diagnosis of Hep C infection who was admitted to [facility] 08/07/14 with left shoulder pain. She had an elevated D-Dimer and underwent CT-angio to r/o PE on 08/07/14 and was admitted for further evaluation. work up revealed MSSA bacteremia with septic arthritis of bilateral AC joints, bilateral knees and right hand flexor tendon sheath. She underwent I and D bilateral knees and I and D with drainage of abscess of bilateral shoulder by Dr. [name] on 08/10/14. She has required repeat I and D left knee on 02/26 and 03/05. Dr. Kathrynn Speed consulted for right hand and forearm infection and patient underwent I and D of right flexor tendon sheath and right volar forearm on 02/27. 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 Kidney function has not improved with treatment of infection and patient has been dialysis dependent since 08/20/14. AVG placed by Dr. Kellie Simmering on 03/30. Patient now on chronic HD and has been clipped. Once discharged, she will go to [facility] on [address].  Past Medical History  Past Medical History  Diagnosis Date  . Diabetes mellitus   . Hypertension   . Peripheral neuropathy     Family History  family history includes Cancer in her brother, mother, and sister; Diabetes in her brother; Heart disease in her father.  Prior Rehab/Hospitalizations: None  Current Medications   Current facility-administered medications:  . 0.9 % sodium chloride infusion, , Intravenous, Continuous, Hosie Poisson, MD, Last Rate: 10 mL/hr at 09/10/14 1813 . acetaminophen (TYLENOL) tablet 650 mg, 650 mg, Oral, Q6H PRN, Gardiner Barefoot, NP, 650 mg at 08/31/14 0839 . antiseptic oral rinse (CPC / CETYLPYRIDINIUM CHLORIDE 0.05%) solution 7 mL, 7 mL, Mouth Rinse, q12n4p, Costin Karlyne Greenspan, MD, 7 mL at 09/13/14 1800 . aspirin chewable tablet 81 mg, 81 mg, Oral, Daily, Lily Kocher, MD, 81 mg at 09/12/14 1000 . ceFAZolin (ANCEF) IVPB 1 g/50 mL premix, 1 g, Intravenous, Q24H, Carly M Sabat, RPH, 1 g at 09/13/14 1800 . [START ON 09/15/2014] ceFAZolin (ANCEF) IVPB 2 g/50 mL premix, 2 g, Intravenous, Q M,W,F-HD, Osborne Oman, RPH . chlorhexidine (PERIDEX) 0.12 % solution 15 mL, 15 mL, Mouth Rinse, BID, Caren Griffins, MD, 15 mL at 09/13/14 2235 . cyclobenzaprine (FLEXERIL) tablet 5 mg, 5 mg, Oral, BID, Lily Kocher, MD, 5 mg at 09/13/14 2235 . [START ON 09/19/2014] Darbepoetin Alfa (ARANESP) injection 200  mcg, 200 mcg, Intravenous, Q Tue-HD, Jamal Maes, MD . diphenhydrAMINE (BENADRYL) capsule 25 mg, 25 mg, Oral, Q6H PRN, Ritta Slot, NP, 25 mg at 09/11/14 0806 . famotidine (PEPCID) tablet 20 mg, 20 mg, Oral, Daily, Jonetta Osgood, MD, 20 mg at 09/13/14 1557 . [START ON 09/15/2014] ferric gluconate (NULECIT) 125 mg in sodium chloride 0.9 % 100 mL IVPB, 125 mg, Intravenous, Q M,W,F-HD, Jamal Maes, MD . gi cocktail (Maalox,Lidocaine,Donnatal), 30 mL, Oral, TID PRN, Donne Hazel, MD, 30 mL at 09/11/14 0529 . guaiFENesin-dextromethorphan (ROBITUSSIN DM) 100-10 MG/5ML syrup 5 mL, 5 mL, Oral, Q4H PRN, Gardiner Barefoot, NP, 5 mL at 08/25/14 0604 . heparin injection 5,000 Units, 5,000 Units, Subcutaneous, 3 times per day, Gabriel Earing, PA-C, 5,000 Units at 09/13/14 2235 . hydrALAZINE (APRESOLINE) injection 10 mg, 10 mg, Intravenous, Q4H PRN, Donne Hazel, MD . hydrALAZINE (APRESOLINE) tablet 25 mg, 25 mg, Oral, BID, Jamal Maes, MD, 25 mg at 09/13/14 2235 . HYDROcodone-acetaminophen (NORCO/VICODIN) 5-325 MG per tablet 1-2 tablet, 1-2 tablet, Oral, Q4H PRN, Lily Kocher, MD, 2 tablet at 09/13/14 1556 . HYDROmorphone (DILAUDID) 1 MG/ML injection, , , ,  . HYDROmorphone (DILAUDID) injection 1 mg, 1 mg, Intravenous, Q4H PRN, Donne Hazel, MD, 1 mg at 09/14/14 1207 . insulin aspart (novoLOG) injection 0-9 Units, 0-9 Units, Subcutaneous, TID WC, Lily Kocher, MD, 1 Units at 09/12/14 1751 . lanthanum (FOSRENOL) chewable tablet 1,000 mg, 1,000 mg, Oral, TID WC, Mauricia Area, MD, 1,000 mg at 09/13/14 1801 . magic mouthwash w/lidocaine, 5 mL, Oral, TID PRN, Donne Hazel, MD, 5 mL at 08/17/14 0217 . metoprolol (LOPRESSOR) tablet 50 mg, 50 mg, Oral, BID, Jamal Maes, MD, 50 mg at 09/13/14 2235 . multivitamin (RENA-VIT) tablet 1 tablet, 1 tablet, Oral, QHS, Jamal Maes, MD, 1 tablet at 09/13/14 0100 . nicotine (NICODERM CQ - dosed in mg/24 hours) patch 21 mg, 21 mg, Transdermal, Daily, Caren Griffins, MD, 21 mg at 09/12/14 1120 . ondansetron [facility]) injection 4 mg, 4 mg, Intravenous, Q6H  PRN, Donne Hazel, MD, 4 mg at 09/12/14 2127 . polyethylene glycol (MIRALAX / GLYCOLAX) packet 17 g, 17 g, Oral, BID, Caren Griffins, MD, 17 g at 09/13/14 1559 . sodium chloride (OCEAN) 0.65 % nasal spray 1 spray, 1 spray, Each Nare, PRN, Gardiner Barefoot, NP, 1 spray at 09/05/14 1035 . sodium chloride 0.9 % injection 10-40 mL, 10-40 mL, Intracatheter, Q12H, Donne Hazel, MD, 10 mL at 09/13/14 2243 . sodium chloride 0.9 % injection 10-40 mL, 10-40 mL, Intracatheter, Q12H, Thurnell Lose, MD, 10 mL at 09/13/14 2243 . sodium chloride 0.9 % injection 10-40 mL, 10-40 mL, Intracatheter, PRN, Thurnell Lose, MD, 10 mL at 09/13/14 2142 . sodium chloride 0.9 % injection 3 mL, 3 mL, Intravenous, Q12H, Lily Kocher, MD, 3 mL at 09/12/14 2121 . sorbitol 70 % solution 30 mL, 30 mL, Oral, Daily PRN, Donne Hazel, MD . zolpidem [facility]) tablet 5 mg, 5 mg, Oral, QHS PRN, Thurnell Lose, MD, 5 mg at 09/11/14 2100  Patients Current Diet: Diet renal/carb modified with fluid restriction Diet-HS Snack?: Nothing; Room service appropriate?: Yes; Fluid consistency:: Thin  Precautions / Restrictions Precautions Precautions: Fall Precaution Comments: Will sit without warning when fatigued; bring chair behind for safety Restrictions Weight Bearing Restrictions: No RLE Weight Bearing: Weight bearing as tolerated LLE Weight Bearing: Weight bearing as tolerated Other Position/Activity Restrictions: As no clarification given after asking from WB clarification, assuming WBAT on  bil UE due to MD wanting active movement all joints   Prior Activity Level Community (5-7x/wk): Went out daily. 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  Home Assistive Devices / Equipment Home Assistive Devices/Equipment: CBG Meter Home Equipment: None  Prior Functional Level Prior Function Level of Independence: Independent  Current Functional Level Cognition  Overall Cognitive Status:  Within Functional Limits for tasks assessed Orientation Level: Oriented X4   Extremity Assessment (includes Sensation/Coordination)  Upper Extremity Assessment: RUE deficits/detail, LUE deficits/detail RUE: Unable to fully assess due to pain LUE: Unable to fully assess due to pain  Lower Extremity Assessment: RLE deficits/detail, LLE deficits/detail RLE Deficits / Details: ankle movement WFL, able to initiate knee extension against gravity, limited by pain RLE: Unable to fully assess due to pain LLE Deficits / Details: ankle movement WFL, able to initiate knee extension against gravity, limited by pain LLE: Unable to fully assess due to pain    ADLs  Anticipate ADL deficits and the need for OT interventions    Mobility  Overal bed mobility: Modified Independent Bed Mobility: Supine to Sit Sidelying to sit: Min assist Supine to sit: Modified independent (Device/Increase time) Sit to supine: Mod assist, +2 for physical assistance General bed mobility comments: mod I from supine to sit at EOB with use of bedrail to assist self    Transfers  Overall transfer level: Needs assistance Equipment used: Rolling walker (2 wheeled) Transfers: Sit to/from Stand Sit to Stand: Min assist, +2 physical assistance, Mod assist Stand pivot transfers: Min assist, +2 safety/equipment General transfer comment: Pt. needed +2 min assist to rise to stand from bed; needed mod assist for controlled descent to recliner as pt. tends to sit with little control; noted pt is quite dependent on momentum for successful sit to stand, indicative of weakness    Ambulation / Gait / Stairs / Wheelchair Mobility  Ambulation/Gait Ambulation/Gait assistance: Museum/gallery curator (Feet): 75 Feet (one seated rest break) Assistive device: Rolling walker (2 wheeled) Gait Pattern/deviations: Step-to pattern, Trunk flexed, Narrow base of support, Ataxic Gait velocity: Decreased Gait velocity  interpretation: Below normal speed for age/gender General Gait Details: vc's for posture' min assist for safety and stability due to her unsteadiness and second person closely following with recliner chair    Posture / Balance Dynamic Sitting Balance Sitting balance - Comments: Completed 5 reps each of LAQ's, full extension on R LE and 20* lag on the L LE Balance Overall balance assessment: Needs assistance Sitting-balance support: No upper extremity supported, Feet supported Sitting balance-Leahy Scale: Good Sitting balance - Comments: Completed 5 reps each of LAQ's, full extension on R LE and 20* lag on the L LE Standing balance support: Bilateral upper extremity supported Standing balance-Leahy Scale: Poor Standing balance comment: Stood in RW x>74min working to accept weight into LE's prior to starting the transfer    Special needs/care consideration BiPAP/CPAP No CPM No Continuous Drip IV KVO Dialysis Yes, T-Th-Sat, new HD  Life Vest: No Oxygen No Special Bed No Trach Size No Wound Vac (area) No  Skin: Has bilateral shoulder incisions ankles and right hand wounds. Bowel mgmt: Last BM 09/14/14 Bladder mgmt: Patient says she is not making urine at this time. Diabetic mgmt Yes, on oral medications at home.    Previous Home Environment Living Arrangements: Spouse/significant other Available Help at Discharge: Family, Available PRN/intermittently (will be alone during day) Type of Home: House Home Layout: One level Home Access: Stairs to enter  Entrance Stairs-Rails: None Entrance Stairs-Number of Steps: 4 Home Care Services: No  Discharge Living Setting Plans for Discharge Living Setting: House, Lives with (comment) (Lives with 14 yo son.) Type of Home at Discharge: House Discharge Home Layout: Two level, Able to live on main level with bedroom/bathroom Alternate Level Stairs-Number of Steps: Flight Discharge Home Access: Stairs to  enter [employer] of Steps: 3-4 steps Does the patient have any problems obtaining your medications?: No  Social/Family/Support Systems Patient Roles: Parent (Has a 13 yo son.) Contact Information: Azarya Binda - son [phone] Anticipated Caregiver: self and son Ability/Limitations of Caregiver: Son goes to school mornings and then comes home 10 am or so. Caregiver Availability: Intermittent Discharge Plan Discussed with Primary Caregiver: Yes Is Caregiver In Agreement with Plan?: Yes Does Caregiver/Family have Issues with Lodging/Transportation while Pt is in Rehab?: No  Goals/Additional Needs Patient/Family Goal for Rehab: PT/OT supervision to mod I goals Cultural Considerations: None Dietary Needs: Renal, carb mod, fluid restricted, thin liquids Equipment Needs: TBD Pt/Family Agrees to Admission and willing to participate: Yes Program Orientation Provided & Reviewed with Pt/Caregiver Including Roles & Responsibilities: Yes  Decrease burden of Care through IP rehab admission: N/A  Possible need for SNF placement upon discharge: Not anticipated  Patient Condition: This patient's medical and functional status has changed since the consult dated: 08/14/14 in which the Rehabilitation Physician determined and documented that the patient's condition is appropriate for intensive rehabilitative care in an inpatient rehabilitation facility. See "History of Present Illness" (above) for medical update. Functional changes are: Currently requiring min assist for transfers and min assist to ambulate 75 ft RW. Patient's medical and functional status update has been discussed with the Rehabilitation physician and patient remains appropriate for inpatient rehabilitation. Will admit to inpatient rehab today.  Preadmission Screen Completed By: Retta Diones, 09/14/2014 1:37 PM ______________________________________________________________________  Discussed status with Dr. Naaman Plummer on  09/14/14 at 1337 and received telephone approval for admission today.  Admission Coordinator: Retta Diones, time1337/Date03/31/16          Cosigned by: Meredith Staggers, MD at 09/14/2014 1:41 PM  Revision History     Date/Time User Provider Type Action   09/14/2014 1:41 PM Meredith Staggers, MD Physician Cosign   09/14/2014 1:38 PM Retta Diones, RN Rehab Admission Coordinator Sign   09/14/2014 1:37 PM Retta Diones, RN Rehab Admission Coordinator Sign   View Details Report

## 2014-09-14 NOTE — H&P (Signed)
Physical Medicine and Rehabilitation Admission H&P    Chief Complaint  Patient presents with  . Disseminated MSSA bacteremia with septic arthritis bilateral knees and bilateral shoulders   HPI: [name] is a  59yo AAF with PMH sig for poorly controlled DM, HTN, CKD (baseline Scr 1.3-1.6) and new diagnosis of Hep C infection who was admitted to [facility] 08/07/14 with left shoulder pain. She had an elevated D-Dimer and underwent CT-angio to r/o PE on 08/07/14 and was admitted for further evaluation. work up revealed MSSA bacteremia with septic arthritis of bilateral AC joints, bilateral knees and right hand flexor tendon sheath. She underwent I and D  bilateral knees and I and D with drainage of abscess of bilateral shoulder by Dr. [name] on 08/10/14. She has required repeat I and D left knee on 02/26 and 03/05.  Dr. Kathrynn Speed consulted for right hand and forearm infection and patient underwent I and D of right flexor tendon sheath and right volar forearm on 02/27. 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 UOP remains poor and kidneys without recovery despite treatment of infection and patient has been dialysis dependent since 08/20/14.   TEE negative for endocarditis. Antibiotics narrowed to Ancef and patient to continue for 8 weeks duration using 2/26 as day 1/56 per Dr. Baxter Flattery. She will need treatment for HCV past completing her antibiotic regimen for MSSA infection.   She had febrile episode on 03/19 due to pseudomonas UTI and was treated with cipro X 5 days. Anemia of chronic disease treated with iron load as well as aranesp with recommendations for transfusion prn Hgb < 7.0.  AVG placed by Dr. Kellie Simmering on 03/30 and patient has had significant pain LUE with hypersensitivity affecting mobility. Po intake remains poor with decreased  energy levels. Therapy ongoing and CIR recommended due to deconditioned state.    Review of Systems  Constitutional: Negative for fever.  Respiratory: Negative for cough and wheezing.   Cardiovascular: Negative for chest pain.  Musculoskeletal: Positive for myalgias and joint pain.  Neurological: Positive for sensory change and focal weakness.      Past Medical History  Diagnosis Date  . Diabetes mellitus   . Hypertension   . Peripheral neuropathy     Past Surgical History  Procedure Laterality Date  . Cholecystectomy    . Appendectomy    . Small intestine surgery      Due to Small Bowel Obstruction  . Tubal ligation    . Knee arthroscopy Right 08/10/2014    Procedure: ARTHROSCOPY I & D KNEE;  Surgeon: Marianna Payment, MD;  Location: [facility];  Service: Orthopedics;  Laterality: Right;  . Shoulder arthroscopy Bilateral 08/10/2014    Procedure: I & D BILATERAL SHOULDERS ;  Surgeon: Marianna Payment, MD;  Location: [facility];  Service: Orthopedics;  Laterality: Bilateral;  . Tenosynovectomy Right 08/11/2014    Procedure: RIGHT WRIST IRRIGATION AND DEBRIDEMENT, TENOSYNOVECTOMY;  Surgeon: Marianna Payment, MD;  Location: [place];  Service: Orthopedics;  Laterality: Right;  . Knee arthroscopy Left 08/11/2014    Procedure: ARTHROSCOPIC WASHOUT LEFT KNEE;  Surgeon: Marianna Payment, MD;  Location: [place];  Service: Orthopedics;  Laterality: Left;  . Tee without cardioversion N/A 08/14/2014    Procedure: TRANSESOPHAGEAL ECHOCARDIOGRAM (TEE);  Surgeon: Thayer Headings, MD;  Location: [place];  Service: Cardiovascular;  Laterality: N/A;  . Knee arthroscopy Left 08/19/2014    Procedure: ARTHROSCOPIC WASHOUT LEFT KNEE;  Surgeon: Naiping M Xu, MD;  Location: [facility];  Service: Orthopedics;  Laterality: Left;    Family History  Problem Relation Age of Onset  . Cancer Mother   . Heart disease Father   . Cancer Sister   . Cancer Brother   . Diabetes Brother     Social History:  reports  that she has been smoking.  She has never used smokeless tobacco. She reports that she drinks alcohol. She reports that she does not use illicit drugs.    Allergies  Allergen Reactions  . Compazine [Prochlorperazine] Shortness Of Breath and Swelling    TONGUE SWELLS  . Omnipaque [Iohexol] Hives  . Shellfish-Derived Products Anaphylaxis  . Iodinated Diagnostic Agents Rash    Medications Prior to Admission  Medication Sig Dispense Refill  . aspirin 81 MG chewable tablet Chew 81-162 mg by mouth daily as needed (For stroke prevention.).     . Blood Glucose Monitoring Suppl (ON CALL EXPRESS GLUCOSE METER) DEVI Use as directed 1 Device 0  . glipiZIDE (GLUCOTROL) 10 MG tablet Take 1 tablet (10 mg total) by mouth 2 (two) times daily before a meal. 60 tablet 3  . glucose blood test strip Use as instructed 100 each 12  . hydrALAZINE (APRESOLINE) 25 MG tablet Take 1 tablet (25 mg total) by mouth 3 (three) times daily. 90 tablet 3  . Menthol-Methyl Salicylate (MUSCLE RUB) 10-15 % CREA Apply 1 application topically daily as needed for muscle pain.    . TRUEPLUS LANCETS 30G MISC Use as directed 100 each 2  . aspirin EC 81 MG tablet Take 1 tablet (81 mg total) by mouth daily as needed for pain. 30 tablet 3    Home: Home Living Family/patient expects to be discharged to:: Private residence Living Arrangements: Spouse/significant other Available Help at Discharge: Family, Available PRN/intermittently (will be alone during day) Type of Home: House Home Access: Stairs to enter Entrance Stairs-Number of Steps: 4 Entrance Stairs-Rails: None Home Layout: One level Home Equipment: None   Functional History: Prior Function Level of Independence: Independent  Functional Status:  Mobility: Bed Mobility Overal bed mobility: Modified Independent Bed Mobility: Supine to Sit Sidelying to sit: Min assist Supine to sit: Modified independent (Device/Increase time) Sit to supine: Mod assist, +2 for  physical assistance General bed mobility comments: mod I from supine to sit at EOB with use of bedrail to assist self Transfers Overall transfer level: Needs assistance Equipment used: Rolling walker (2 wheeled) Transfers: Sit to/from Stand Sit to Stand: Min assist, +2 physical assistance, Mod assist Stand pivot transfers: Min assist, +2 safety/equipment General transfer comment: Pt. needed +2 min assist to rise to stand from bed; needed mod assist for controlled descent to recliner as pt. tends to sit with little control; noted pt is quite dependent on momentum for successful sit to stand, indicative of weakness Ambulation/Gait Ambulation/Gait assistance: Min assist Ambulation Distance (Feet): 75 Feet (one seated rest break) Assistive device: Rolling walker (2 wheeled) Gait Pattern/deviations: Step-to pattern, Trunk flexed, Narrow base of support, Ataxic Gait velocity: Decreased Gait velocity interpretation: Below normal speed for age/gender General Gait Details: vc's for posture' min assist for safety and stability due to her unsteadiness and second person closely following with recliner chair    ADL:    Cognition: Cognition Overall Cognitive Status: Within Functional Limits for tasks assessed Orientation Level: Oriented X4 Cognition Arousal/Alertness:   Awake/alert Behavior During Therapy: WFL for tasks assessed/performed Overall Cognitive Status: Within Functional Limits for tasks assessed    Blood pressure 144/64, pulse 98, temperature 98.5 F (36.9 C), temperature source Oral, resp. rate 18, height 5' 7" (1.702 m), weight 92.2 kg (203 lb 4.2 oz), last menstrual period 03/16/2002, SpO2 92 %. Physical Exam  Nursing note and vitals reviewed. Constitutional: She is oriented to person, place, and time. She appears well-developed and well-nourished.  HENT:  Head: Normocephalic and atraumatic.  Eyes: Conjunctivae are normal. Pupils are equal, round, and reactive to light.  Neck:  Normal range of motion. Neck supple.  Cardiovascular: Normal rate, regular rhythm and normal heart sounds.   Respiratory: Effort normal and breath sounds normal.  GI: Soft. Bowel sounds are normal.  Musculoskeletal: She exhibits edema (1+ edema L-forearm with allodynia. ).  Healing incisions bilateral knees with edema L>R. Left knee with pain on ROM. Moves RUE without difficulty. LUE limited due to recent surgery.  Mild edema noted  Neurological: She is alert and oriented to person, place, and time.  Skin: Skin is warm and dry.  Psychiatric: Her speech is normal. Judgment and thought content normal. Her mood appears anxious. Her affect is blunt.    Results for orders placed or performed during the hospital encounter of 08/07/14 (from the past 48 hour(s))   Collection Time: 09/13/14  7:29 AM  Result Value Ref Range   Glucose-Capillary 106 (H) 70 - 99 mg/dL  Glucose, capillary     Status: Abnormal   Collection Time: 09/13/14 10:27 AM  Result Value Ref Range   Glucose-Capillary 111 (H) 70 - 99 mg/dL  Glucose, capillary     Status: None   Collection Time: 09/13/14  1:01 PM  Result Value Ref Range   Glucose-Capillary 93 70 - 99 mg/dL   Comment 1 Notify RN   Glucose, capillary     Status: Abnormal   Collection Time: 09/13/14  5:04 PM  Result Value Ref Range   Glucose-Capillary 108 (H) 70 - 99 mg/dL  Glucose, capillary     Status: Abnormal   Collection Time: 09/13/14  9:16 PM  Result Value Ref Range   Glucose-Capillary 100 (H) 70 - 99 mg/dL  CBC     Status: Abnormal   Collection Time: 09/14/14  9:50 AM  Result Value Ref Range   WBC 7.4 4.0 - 10.5 K/uL   RBC 2.77 (L) 3.87 - 5.11 MIL/uL   Hemoglobin 8.1 (L) 12.0 - 15.0 g/dL   HCT 26.3 (L) 36.0 - 46.0 %   MCV 94.9 78.0 - 100.0 fL   MCH 29.2 26.0 - 34.0 pg   MCHC 30.8 30.0 - 36.0 g/dL   RDW 15.4 11.5 - 15.5 %   Platelets 385 150 - 400 K/uL  Renal function panel     Status: Abnormal   Collection Time: 09/14/14  9:50 AM  Result Value  Ref Range   Sodium 136 135 - 145 mmol/L   Potassium 4.6 3.5 - 5.1 mmol/L   Chloride 101 96 - 112 mmol/L   CO2 26 19 - 32 mmol/L   Glucose, Bld 109 (H) 70 - 99 mg/dL   BUN 15 6 - 23 mg/dL   Creatinine, Ser 6.22 (H) 0.50 - 1.10 mg/dL   Calcium 8.0 (L) 8.4 - 10.5 mg/dL   Phosphorus 4.7 (H) 2.3 - 4.6 mg/dL   Albumin 1.4 (L) 3.5 - 5.2 g/dL   GFR calc non Af Amer 7 (L) >90 mL/min     GFR calc Af Amer 8 (L) >90 mL/min    Comment: (NOTE) The eGFR has been calculated using the CKD EPI equation. This calculation has not been validated in all clinical situations. eGFR's persistently <90 mL/min signify possible Chronic Kidney Disease.    Anion gap 9 5 - 15   No results found.     Medical Problem List and Plan: 1. 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  DVT Prophylaxis/Anticoagulation: Pharmaceutical: Lovenox 3. Pain Management: Will change IV dilaudid to oxycodone 10 mg prn. Discussed need to transition to oral meds and will add xanax to help with anxiety issues.  4. Adjustment reaction/ Mood: Team to provide ego support. Patient not interested in antidepressant at this time. LCSW to follow for evaluation and support.  5. Neuropsych: This patient is capable of making decisions on her own behalf. 6. Skin/Wound Care: Routine pressure relief measures. Elevate LUE when in bed to help with edema control 7. Fluids/Electrolytes/Nutrition: Needs 1200 FR due to ESRD.  Routine check of lytes with HD. 8. ESRD: Schedule HD past therapy sessions to help with tolerance and endurance.  on 9. Septic arthritis:  To continue  Ancef D# 35/56 10. DM type 2 with neuropathy: Resume gabapentin as complaining of significant neuropathic symptoms BLE. Continue to monitor BS ac/hs basis and use SSI for elevated BS. Off glipizide and currently diet controlled as po intake is poor. Will add nephro for supplement.  11.  HTN: Monitor every 8 hours. Continue  Hydralazine bid.      Post Admission Physician Evaluation: 1. Functional deficits secondary  to  Septic joints due to MSSA bacteremia progressing to ESRD/debility 2. Patient is admitted to receive collaborative, interdisciplinary care between the physiatrist, rehab nursing staff, and therapy team. 3. Patient's level of medical complexity and substantial therapy needs in context of that medical necessity cannot be provided at a lesser intensity of care such as a SNF. 4. Patient has experienced substantial functional loss from his/her baseline which was documented above under the "Functional History" and "Functional Status" headings.  Judging by the patient's diagnosis, physical exam, and functional history, the patient has potential for functional progress which will result in measurable gains while on inpatient rehab.  These gains will be of substantial and practical use upon discharge  in facilitating mobility and self-care at the household level. 5. Physiatrist will provide 24 hour management of medical needs as well as oversight of the therapy plan/treatment and provide guidance as appropriate regarding the interaction of the two. 6. 24 hour rehab nursing will assist with bladder management, bowel management, safety, skin/wound care, disease management, medication administration, pain management and patient education  and help integrate therapy concepts, techniques,education, etc. 7. PT will assess and treat for/with: Lower extremity strength, range of motion, stamina, balance, functional mobility, safety, adaptive techniques and equipment, pain mgt, id precautions, ego support, community reintegration.   Goals are: mod I. 8. OT will assess and treat for/with: ADL's, functional mobility, safety, upper extremity strength, adaptive techniques and equipment, pain mgt, wound consideration, ego support, leisure awareness.   Goals are: mod I. Therapy may not yet proceed with showering this patient. 9. SLP  will assess and treat for/with: n/a.  Goals are: n/a. 10. Case Management and Social Worker will assess and treat for psychological issues and discharge planning. 11. Team conference will be held weekly to assess progress toward goals and to determine barriers to discharge. 12. Patient will receive at least   3 hours of therapy per day at least 5 days per week. 13. ELOS: 10-16 days       14. Prognosis:  excellent     Meredith Staggers, MD, [place] [facility] 09/14/2014   09/14/2014

## 2014-09-14 NOTE — Discharge Summary (Signed)
Physician Discharge Summary  Brandy Houston ZOX:[id number] DOB: September 06, 1955 DOA: 08/07/2014  PCP: Chari Manning, NP  Admit date: 08/07/2014 Discharge date: 09/14/2014  Time spent: 40 minutes  Recommendations for Outpatient Follow-up:    1. (include homehealth, outpatient follow-up instructions, specific recommendations for PCP to follow-up on, etc.)  Discharge Diagnoses:  Principal Problem:   Staphylococcus aureus bacteremia Active Problems:   Septic joint of left shoulder region   Sinus tachycardia   Septic joint of right knee joint   CKD (chronic kidney disease) stage 3, GFR 30-59 ml/min   Septic joint of right hand   SIRS (systemic inflammatory response syndrome)   Benign essential HTN   Hepatitis C   Acute renal failure   Acute renal failure syndrome   Hep C w/ coma, chronic   Joint infection   Discharge Condition: Stable  Diet recommendation: Renal diabetic  Filed Weights   09/13/14 2100 09/14/14 0728 09/14/14 1133  Weight: 95.9 kg (211 lb 6.7 oz) 97 kg (213 lb 13.5 oz) 92.2 kg (203 lb 4.2 oz)    History of present illness:  Brandy Houston is a 59 y.o. female came to [facility] ed 08/07/2014 with a chief complaint of intractable left shoulder pain. 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 She says that she was lifting a large bag of laundry on Thursday, when she developed the acute onset of left shoulder pain. She does not recall hearing a pop or snap. By Friday morning, she could not lift her arm secondary to intractable pain. She has taken Aleve, one of her daughter's muscle relaxers, and she has used a topical ointment, without significant relief. Pain localizes to left shoulder and neck, with significantly reduced ROM in the left arm. Further work up revealed MSSA bacteremia with septic arthritis of the numerous joints. She underwent arthroscopic irrigation and debridement of the septic arthritis. TEE is negative for endocarditis. She was started on  ancef by ID and recommended 8 weeks of treatment. Meanwhile her renal function worsened and renal consulted. She was started on HD sessions intermittent from 3/9.Required intermittent HD and now declared ESRD and started CLIP process this week AV fistula is done on 09/13/14.  Hospital Course:   MSSA BACTEREMIA with septic arthritis: -Bilateral knee and shoulder septic arthritis,underwent irrigation and debridement of Right knee, left knee, Right AC & left AC joints, and Irrigation and debridement of right hand flexor tendon sheath. -Currently on cefazolin to continue a total of 8 weeks of treatment, 2/26 as day1 and last day 4/22 covering for MSSA. -Underwent TTE on 2/24 & TEE on 2/29 that was negative for endocarditis. -She again underwent R knee arthroscopic irrigation and debridement on 3/5 by Dr [name] - D/Ced to inpatient rehab   2. UTI -She had episode of fever on 3/19, possibly from UTI, urine cultures showed pseudomonas sensitive to ciprofloxacin -completed 5 days of ciprofloxacin on 3/26  3. 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  -started on Temporary HD via R tunned IJ dialysis catheter, started Hd on 3/9 then on 3/14, and HD 3/19 -Was then on high dose IV lasix without recovery per Renal -some symptoms of uremia and volume overloaded-significantly 3/26 -s/p HD again 3/26 and 3/29, started CLIP process,  AVf placed on 09/13/2014. Plan to use it in 4 weeks.  4. Diabetes mellitus; -stable -SSI  5. Hepatitis C antibody positive: -FU with ID as outpatient.  6. Anemia; -Anemia due to chronic disease and blood loss, acute illness -epo as per renal. She received a dose of ferra heme and continue with iron supplements. -Transfuse to keep hemoglobin greater than 7   7. Severe malnutrition -likely due to severe sepsis and third spacing -Severe hypoalbuminemia, Nutrition supplementation.   -Concern about nephrotic syndrome with high protein in the urine  8. Hypertension: well controlled.  9.Urinary retention: resolved -stopped oxybutynin,  -removed foley 3/27  10.Debility -Continue to physical therapy and occupational therapy -Plan to discharge the patient to skilled nursing facility  11. Hyponatremia --Hypervolemic hyponatremia.  Consultants:  Renal Dr. Marjory Sneddon  ID  Orthopedics.     Procedures:  HD  Arthroscopic irrigation and debridement of septic arthritis of left knee on 3/5  Arthroscopic extensive synovectomy in all 3 compartments of the left knee ON 3/5 by Dr. [name]  Renal biopsy done on march 4 th 2016 and was found to have C3 Acute diffuse proliferative glomerulonephritis.  AVF placed 3/30 by Dr. Kellie Simmering Discharge Exam: Filed Vitals:   09/14/14 1215  BP: 144/64  Pulse: 98  Temp: 98.5 F (36.9 C)  Resp: 18    General: Not In distress Cardiovascular: S1-S2 regular Respiratory: Decreased breath sounds in the lower lobes  Discharge Instructions    Current Discharge Medication List    START taking these medications   Details  ceFAZolin (ANCEF) 2-3 GM-% SOLR Inject 50 mLs (2 g total) into the vein every Monday, Wednesday, and Friday with hemodialysis.    Darbepoetin Alfa (ARANESP) 200 MCG/0.4ML SOSY injection Inject 0.4 mLs (200 mcg total) into the vein every 7 (seven) days. Qty: 1.68 mL, Refills: 0    ferric gluconate 125 mg in sodium chloride 0.9 % 100 mL Inject 125 mg into the vein every Monday, Wednesday, and Friday with hemodialysis.    hydrALAZINE (APRESOLINE) 20 MG/ML injection Inject 0.5 mLs (10 mg total) into the vein every 8 (eight) hours as needed (sbp>180). Qty: 1 mL    HYDROcodone-acetaminophen (NORCO/VICODIN) 5-325 MG per tablet Take 1 tablet by mouth every 4 (four) hours as needed for moderate pain. Qty: 30 tablet, Refills: 0    metoprolol (LOPRESSOR) 50 MG tablet Take 1 tablet (50 mg total) by mouth 2  (two) times daily.    polyethylene glycol (MIRALAX / GLYCOLAX) packet Take 17 g by mouth 2 (two) times daily. Qty: 14 each, Refills: 0      CONTINUE these medications which have NOT CHANGED   Details  aspirin 81 MG chewable tablet Chew 81-162 mg by mouth daily as needed (For stroke prevention.).     Blood Glucose Monitoring Suppl (ON CALL EXPRESS GLUCOSE METER) DEVI Use as directed Qty: 1 Device, Refills: 0   Associated Diagnoses: Type II or unspecified type diabetes mellitus without mention of complication, uncontrolled    glipiZIDE (GLUCOTROL) 10 MG tablet Take 1 tablet (10 mg total) by mouth 2 (two) times daily before a meal. Qty: 60 tablet, Refills: 3   Associated Diagnoses: Type 2 diabetes mellitus without complication    glucose blood test strip Use as instructed Qty: 100 each, Refills: 12   Associated Diagnoses: Type II or unspecified type diabetes mellitus without mention of complication, uncontrolled      STOP taking these medications     hydrALAZINE (APRESOLINE) 25 MG tablet      Menthol-Methyl Salicylate (MUSCLE RUB) 10-15 % CREA      TRUEPLUS LANCETS 30G MISC      aspirin EC 81 MG  tablet        Allergies  Allergen Reactions  . Compazine [Prochlorperazine] Shortness Of Breath and Swelling    TONGUE SWELLS  . Omnipaque [Iohexol] Hives  . Shellfish-Derived Products Anaphylaxis  . Iodinated Diagnostic Agents Rash   Follow-up Information    Follow up with [facility].   Why:  As needed   Contact information:   [address] [place] [address]        The results of significant diagnostics from this hospitalization (including imaging, microbiology, ancillary and laboratory) are listed below for reference.    Significant Diagnostic Studies: Dg Chest 2 View  09/02/2014   CLINICAL DATA:  Fever, dialysis patient  EXAM: CHEST  2 VIEW  COMPARISON:  Chest radiograph 08/22/2014  FINDINGS: Right-sided dialysis catheter is in good  position. Left central venous line with tip in distal SVC. Normal cardiac silhouette. The bilateral pleural effusions which are moderate. Effusions appear increased compared to prior.  IMPRESSION: Increasing bilateral moderate effusions.   Electronically Signed   By: Suzy Bouchard M.D.   On: 09/02/2014 12:47   Mr Brain Wo Contrast  08/25/2014   CLINICAL DATA:  Left-sided facial droop  EXAM: MRI HEAD WITHOUT CONTRAST  TECHNIQUE: Multiplanar, multiecho pulse sequences of the brain and surrounding structures were obtained without intravenous contrast.  COMPARISON:  None.  FINDINGS: Diffusion imaging does not show any acute or subacute infarction. The brainstem and cerebellum are normal. The cerebral hemispheres show mild chronic small-vessel change of the white matter. No cortical or large vessel territory infarction. No mass lesion, hemorrhage, hydrocephalus or extra-axial collection. No pituitary mass. No inflammatory sinus disease. Major vessels at the base of the brain show flow.  IMPRESSION: No acute or significant finding. Mild chronic small-vessel change of the cerebral hemispheric white matter.   Electronically Signed   By: Nelson Chimes M.D.   On: 08/25/2014 10:20   US Renal  08/15/2014   CLINICAL DATA:  Acute renal failure.  EXAM: RENAL/URINARY TRACT ULTRASOUND COMPLETE  COMPARISON:  None.  FINDINGS: Right Kidney:  Length: 11.6 cm. Increased echogenicity of the parenchyma consistent with renal medical disease. No hydronephrosis or mass lesion or thinning of the renal cortex.  Left Kidney:  Length: 11.9 cm. Increased echogenicity of the renal parenchyma with no thinning of the cortex and no hydronephrosis or mass.  Bladder:  Normal.  Incidental note is made of small bilateral pleural effusions.  IMPRESSION: Increased echogenicity of the renal parenchyma both kidneys consistent with renal medical disease. Small bilateral pleural effusions.   Electronically Signed   By: Lorriane Shire M.D.   On:  08/15/2014 20:08   US Biopsy  08/18/2014   CLINICAL DATA:  Acute renal failure and need for renal biopsy.  EXAM: ULTRASOUND GUIDED CORE BIOPSY OF RIGHT KIDNEY  MEDICATIONS: 1.0 mg IV Versed; 50 mcg IV Fentanyl  Total Moderate Sedation Time: 13 minutes  PROCEDURE: The procedure, risks, benefits, and alternatives were explained to the patient. Questions regarding the procedure were encouraged and answered. The patient understands and consents to the procedure.  The right posterior flank region was prepped with Betadine in a sterile fashion, and a sterile drape was applied covering the operative field. A sterile gown and sterile gloves were used for the procedure. Local anesthesia was provided with 1% Lidocaine. A time-out was performed prior to the procedure.  Ultrasound was performed of both kidneys from a posterior approach. The right kidney was chosen for sampling. Under ultrasound guidance, 2 separate  16 gauge core biopsy passes were obtained of lower pole cortex. Core biopsy samples were submitted in saline for nephropathologic analysis. Post biopsy imaging was performed with ultrasound.  COMPLICATIONS: None.  FINDINGS: The right renal anatomy was more favorable for biopsy compared to the left. Intact core biopsy samples were obtained. There were no immediate bleeding complications.  IMPRESSION: Ultrasound-guided core biopsy performed of lower pole cortex of the right kidney.   Electronically Signed   By: Aletta Edouard M.D.   On: 08/18/2014 17:11   Ir Fluoro Guide Cv Line Left  08/29/2014   INDICATION: End-stage renal disease - in need of conversion of temporary right internal jugular approach dialysis catheter to a tunneled/permanent dialysis catheter for continuation of dialysis.  Patient is admitted to the hospital and in need of durable intravenous access for medication administration and blood draws. As such, given patient's history of end-stage renal disease, request made for placement of a left  internal jugular approach PICC line.  EXAM: 1. CONVERSION OF EXISTING RIGHT INTERNAL JUGULAR APPROACH TEMPORARY DIALYSIS CATHETER TO A TUNNELED CENTRAL VENOUS HEMODIALYSIS CATHETER PLACEMENT WITH FLUOROSCOPIC GUIDANCE 2. ULTRASOUND GUIDANCE FOR VENOUS ACCESS 3. ULTRASOUND FLUOROSCOPIC GUIDED PLACEMENT OF A LEFT INTERNAL JUGULAR APPROACH PICC LINE  MEDICATIONS: Ancef 2 gm IV; The IV antibiotic was given in an appropriate time interval prior to skin puncture.  CONTRAST:  None  ANESTHESIA/SEDATION: Versed 3 mg IV; Fentanyl 100 mcg IV  Total Moderate Sedation Time  40 minutes.  FLUOROSCOPY TIME:  48 seconds (22.0 mGy)  COMPLICATIONS: None immediate  PROCEDURE: Informed written consent was obtained from the patient after a discussion of the risks, benefits, and alternatives to treatment. Questions regarding the procedure were encouraged and answered.  Initially, attention was paid towards acquisition of a left internal jugular approach PICC line. As such, the left neck and chest were prepped with chlorhexidine in a sterile fashion, and a sterile drape was applied covering the operative field. Maximum barrier sterile technique with sterile gowns and gloves were used for the procedure. A timeout was performed prior to the initiation of the procedure.  After creating a small venotomy incision, a micropuncture kit was utilized to access the left internal jugular vein under direct, real-time ultrasound guidance after the overlying soft tissues were anesthetized with 1% lidocaine with epinephrine. Ultrasound image documentation was performed. The microwire was kinked to measure appropriate catheter length. Ultimately, a 19 cm dual lumen PICC line was advanced through the peel-away sheath with tip terminating within the superior aspect of the right atrium. A post placement spot fluoroscopic image was obtained. The catheter was secured at the exit site with an interrupted suture.  Attention was now paid towards conversion of the  existing temporary right internal jugular approach dialysis catheter to a permanent/tunneled dialysis catheter. The existing temporary dialysis catheter was used for measurement purposes. A HemoSplit tunneled hemodialysis catheter measuring 23 cm from tip to cuff was tunneled in a retrograde fashion from the anterior chest wall to the venotomy incision.  A stiff Glidewire was cannulated within in the temporary dialysis catheter advanced the level of the IVC. Under intermittent fluoroscopic guidance, the existing temporary dialysis catheter was exchanged for a peel-away sheath. The tunneled hemodialysis catheter was then placed through the peel-away sheath with tips ultimately positioned within the superior aspect of the right atrium. Final catheter positioning was confirmed and documented with a spot radiographic image. The catheter aspirates and flushes normally. The catheter was flushed with appropriate volume heparin dwells.  The catheter  exit site was secured with a 0-Prolene retention suture. The venotomy incision was closed with an interrupted 4-0 Vicryl, Dermabond and Steri-strips.  Dressings were applied. The patient tolerated the above procedures well without immediate post procedural complication.  IMPRESSION: 1. Successful conversion of a 23 cm tip to cuff tunneled hemodialysis catheter with tips terminating within the superior aspect of the right atrium. The catheter is ready for immediate use. 2. Successful placement of a left internal jugular approach dual lumen PICC line with tip terminating within the superior aspect of the right atrium. The catheter is ready for immediate use.   Electronically Signed   By: Sandi Mariscal M.D.   On: 08/29/2014 11:02   Ir Fluoro Guide Cv Line Right  08/29/2014   INDICATION: End-stage renal disease - in need of conversion of temporary right internal jugular approach dialysis catheter to a tunneled/permanent dialysis catheter for continuation of dialysis.  Patient is  admitted to the hospital and in need of durable intravenous access for medication administration and blood draws. As such, given patient's history of end-stage renal disease, request made for placement of a left internal jugular approach PICC line.  EXAM: 1. CONVERSION OF EXISTING RIGHT INTERNAL JUGULAR APPROACH TEMPORARY DIALYSIS CATHETER TO A TUNNELED CENTRAL VENOUS HEMODIALYSIS CATHETER PLACEMENT WITH FLUOROSCOPIC GUIDANCE 2. ULTRASOUND GUIDANCE FOR VENOUS ACCESS 3. ULTRASOUND FLUOROSCOPIC GUIDED PLACEMENT OF A LEFT INTERNAL JUGULAR APPROACH PICC LINE  MEDICATIONS: Ancef 2 gm IV; The IV antibiotic was given in an appropriate time interval prior to skin puncture.  CONTRAST:  None  ANESTHESIA/SEDATION: Versed 3 mg IV; Fentanyl 100 mcg IV  Total Moderate Sedation Time  40 minutes.  FLUOROSCOPY TIME:  48 seconds (20.2 mGy)  COMPLICATIONS: None immediate  PROCEDURE: Informed written consent was obtained from the patient after a discussion of the risks, benefits, and alternatives to treatment. Questions regarding the procedure were encouraged and answered.  Initially, attention was paid towards acquisition of a left internal jugular approach PICC line. As such, the left neck and chest were prepped with chlorhexidine in a sterile fashion, and a sterile drape was applied covering the operative field. Maximum barrier sterile technique with sterile gowns and gloves were used for the procedure. A timeout was performed prior to the initiation of the procedure.  After creating a small venotomy incision, a micropuncture kit was utilized to access the left internal jugular vein under direct, real-time ultrasound guidance after the overlying soft tissues were anesthetized with 1% lidocaine with epinephrine. Ultrasound image documentation was performed. The microwire was kinked to measure appropriate catheter length. Ultimately, a 19 cm dual lumen PICC line was advanced through the peel-away sheath with tip terminating within the  superior aspect of the right atrium. A post placement spot fluoroscopic image was obtained. The catheter was secured at the exit site with an interrupted suture.  Attention was now paid towards conversion of the existing temporary right internal jugular approach dialysis catheter to a permanent/tunneled dialysis catheter. The existing temporary dialysis catheter was used for measurement purposes. A HemoSplit tunneled hemodialysis catheter measuring 23 cm from tip to cuff was tunneled in a retrograde fashion from the anterior chest wall to the venotomy incision.  A stiff Glidewire was cannulated within in the temporary dialysis catheter advanced the level of the IVC. Under intermittent fluoroscopic guidance, the existing temporary dialysis catheter was exchanged for a peel-away sheath. The tunneled hemodialysis catheter was then placed through the peel-away sheath with tips ultimately positioned within the superior aspect of the right atrium.  Final catheter positioning was confirmed and documented with a spot radiographic image. The catheter aspirates and flushes normally. The catheter was flushed with appropriate volume heparin dwells.  The catheter exit site was secured with a 0-Prolene retention suture. The venotomy incision was closed with an interrupted 4-0 Vicryl, Dermabond and Steri-strips.  Dressings were applied. The patient tolerated the above procedures well without immediate post procedural complication.  IMPRESSION: 1. Successful conversion of a 23 cm tip to cuff tunneled hemodialysis catheter with tips terminating within the superior aspect of the right atrium. The catheter is ready for immediate use. 2. Successful placement of a left internal jugular approach dual lumen PICC line with tip terminating within the superior aspect of the right atrium. The catheter is ready for immediate use.   Electronically Signed   By: Sandi Mariscal M.D.   On: 08/29/2014 11:02   Ir Fluoro Guide Cv Line Right  08/18/2014    CLINICAL DATA:  Acute renal failure and need for non tunneled hemodialysis catheter.  EXAM: NON-TUNNELED CENTRAL VENOUS CATHETER PLACEMENT WITH ULTRASOUND AND FLUOROSCOPIC GUIDANCE  FLUOROSCOPY TIME:  6 seconds.  PROCEDURE: The procedure, risks, benefits, and alternatives were explained to the patient. Questions regarding the procedure were encouraged and answered. The patient understands and consents to the procedure.  The right neck and chest were prepped with chlorhexidine in a sterile fashion, and a sterile drape was applied covering the operative field. Maximum barrier sterile technique with sterile gowns and gloves were used for the procedure. Local anesthesia was provided with 1% lidocaine. A time-out was performed prior to the procedure.  After creating a small venotomy incision, a 19 gauge needle was advanced into the right internal jugular vein under direct, real-time ultrasound guidance. Ultrasound image documentation was performed. After securing guidewire access, the venotomy was dilated. A 13 French, 20 cm length Trialysis catheter was then advanced over the wire. Catheter position was confirmed by a fluoroscopic spot image.  The catheter was aspirated, flushed with saline, and injected with appropriate volume heparin dwells. The catheter exit site was secured with 0-Prolene retention sutures.  COMPLICATIONS: None.  No pneumothorax.  FINDINGS: After catheter placement, the tip lies at the cavoatrial junction. The catheter aspirates normally and is ready for immediate use.  IMPRESSION: Placement of non-tunneled central venous dialysis catheter via the right internal jugular vein. The catheter tip lies at the cavoatrial junction. The catheter is ready for immediate use.   Electronically Signed   By: Aletta Edouard M.D.   On: 08/18/2014 17:02   Ir US Guide Vasc Access Right  08/29/2014   INDICATION: End-stage renal disease - in need of conversion of temporary right internal jugular approach dialysis  catheter to a tunneled/permanent dialysis catheter for continuation of dialysis.  Patient is admitted to the hospital and in need of durable intravenous access for medication administration and blood draws. As such, given patient's history of end-stage renal disease, request made for placement of a left internal jugular approach PICC line.  EXAM: 1. CONVERSION OF EXISTING RIGHT INTERNAL JUGULAR APPROACH TEMPORARY DIALYSIS CATHETER TO A TUNNELED CENTRAL VENOUS HEMODIALYSIS CATHETER PLACEMENT WITH FLUOROSCOPIC GUIDANCE 2. ULTRASOUND GUIDANCE FOR VENOUS ACCESS 3. ULTRASOUND FLUOROSCOPIC GUIDED PLACEMENT OF A LEFT INTERNAL JUGULAR APPROACH PICC LINE  MEDICATIONS: Ancef 2 gm IV; The IV antibiotic was given in an appropriate time interval prior to skin puncture.  CONTRAST:  None  ANESTHESIA/SEDATION: Versed 3 mg IV; Fentanyl 100 mcg IV  Total Moderate Sedation Time  40 minutes.  FLUOROSCOPY  TIME:  48 seconds (16.1 mGy)  COMPLICATIONS: None immediate  PROCEDURE: Informed written consent was obtained from the patient after a discussion of the risks, benefits, and alternatives to treatment. Questions regarding the procedure were encouraged and answered.  Initially, attention was paid towards acquisition of a left internal jugular approach PICC line. As such, the left neck and chest were prepped with chlorhexidine in a sterile fashion, and a sterile drape was applied covering the operative field. Maximum barrier sterile technique with sterile gowns and gloves were used for the procedure. A timeout was performed prior to the initiation of the procedure.  After creating a small venotomy incision, a micropuncture kit was utilized to access the left internal jugular vein under direct, real-time ultrasound guidance after the overlying soft tissues were anesthetized with 1% lidocaine with epinephrine. Ultrasound image documentation was performed. The microwire was kinked to measure appropriate catheter length. Ultimately, a 19 cm  dual lumen PICC line was advanced through the peel-away sheath with tip terminating within the superior aspect of the right atrium. A post placement spot fluoroscopic image was obtained. The catheter was secured at the exit site with an interrupted suture.  Attention was now paid towards conversion of the existing temporary right internal jugular approach dialysis catheter to a permanent/tunneled dialysis catheter. The existing temporary dialysis catheter was used for measurement purposes. A HemoSplit tunneled hemodialysis catheter measuring 23 cm from tip to cuff was tunneled in a retrograde fashion from the anterior chest wall to the venotomy incision.  A stiff Glidewire was cannulated within in the temporary dialysis catheter advanced the level of the IVC. Under intermittent fluoroscopic guidance, the existing temporary dialysis catheter was exchanged for a peel-away sheath. The tunneled hemodialysis catheter was then placed through the peel-away sheath with tips ultimately positioned within the superior aspect of the right atrium. Final catheter positioning was confirmed and documented with a spot radiographic image. The catheter aspirates and flushes normally. The catheter was flushed with appropriate volume heparin dwells.  The catheter exit site was secured with a 0-Prolene retention suture. The venotomy incision was closed with an interrupted 4-0 Vicryl, Dermabond and Steri-strips.  Dressings were applied. The patient tolerated the above procedures well without immediate post procedural complication.  IMPRESSION: 1. Successful conversion of a 23 cm tip to cuff tunneled hemodialysis catheter with tips terminating within the superior aspect of the right atrium. The catheter is ready for immediate use. 2. Successful placement of a left internal jugular approach dual lumen PICC line with tip terminating within the superior aspect of the right atrium. The catheter is ready for immediate use.   Electronically  Signed   By: Sandi Mariscal M.D.   On: 08/29/2014 11:02   Ir US Guide Vasc Access Right  08/18/2014   CLINICAL DATA:  Acute renal failure and need for non tunneled hemodialysis catheter.  EXAM: NON-TUNNELED CENTRAL VENOUS CATHETER PLACEMENT WITH ULTRASOUND AND FLUOROSCOPIC GUIDANCE  FLUOROSCOPY TIME:  6 seconds.  PROCEDURE: The procedure, risks, benefits, and alternatives were explained to the patient. Questions regarding the procedure were encouraged and answered. The patient understands and consents to the procedure.  The right neck and chest were prepped with chlorhexidine in a sterile fashion, and a sterile drape was applied covering the operative field. Maximum barrier sterile technique with sterile gowns and gloves were used for the procedure. Local anesthesia was provided with 1% lidocaine. A time-out was performed prior to the procedure.  After creating a small venotomy incision, a 19 gauge needle  was advanced into the right internal jugular vein under direct, real-time ultrasound guidance. Ultrasound image documentation was performed. After securing guidewire access, the venotomy was dilated. A 13 French, 20 cm length Trialysis catheter was then advanced over the wire. Catheter position was confirmed by a fluoroscopic spot image.  The catheter was aspirated, flushed with saline, and injected with appropriate volume heparin dwells. The catheter exit site was secured with 0-Prolene retention sutures.  COMPLICATIONS: None.  No pneumothorax.  FINDINGS: After catheter placement, the tip lies at the cavoatrial junction. The catheter aspirates normally and is ready for immediate use.  IMPRESSION: Placement of non-tunneled central venous dialysis catheter via the right internal jugular vein. The catheter tip lies at the cavoatrial junction. The catheter is ready for immediate use.   Electronically Signed   By: Aletta Edouard M.D.   On: 08/18/2014 17:02   Dg Chest Port 1 View  08/22/2014   CLINICAL DATA:  Cough,  hypertension, diabetes, smoker  EXAM: PORTABLE CHEST - 1 VIEW  COMPARISON:  Portable exam 0556 hr compared to 08/07/2014  FINDINGS: RIGHT jugular central venous catheter tip projecting over SVC near cavoatrial junction.  Enlargement of cardiac silhouette with pulmonary vascular congestion.  Peribronchial thickening.  Slight chronic accentuation of interstitial markings similar to previous exam.  Minimal subsegmental atelectasis at RIGHT base.  Atelectasis versus consolidation in LEFT lower lobe with loss of LEFT diaphragmatic silhouette.  No gross pleural effusion or pneumothorax.  IMPRESSION: Enlargement of cardiac silhouette with pulmonary vascular congestion.  Chronic interstitial prominence with minimal RIGHT basilar atelectasis and new atelectasis versus consolidation in LEFT lower lobe.   Electronically Signed   By: Lavonia Dana M.D.   On: 08/22/2014 07:45   Dg Abd Portable 1v  08/29/2014   CLINICAL DATA:  Nausea vomiting today.  EXAM: PORTABLE ABDOMEN - 1 VIEW  COMPARISON:  01/03/2015  FINDINGS: There are small amounts of colonic contrast. Numerous surgical clips are identified in the right upper quadrant and right lower quadrant. Bowel gas pattern is nonobstructive. No evidence for free intraperitoneal air.  IMPRESSION: Residual small amounts of contrast. Correlation with timing of previous contrast exam is recommended. No recent exams have been performed with oral contrast in the [facility] system.   Electronically Signed   By: Nolon Nations M.D.   On: 08/29/2014 19:32   Dg Abd Portable 1v  08/21/2014   CLINICAL DATA:  Constipation, vomiting since yesterday, initial encounter.  EXAM: PORTABLE ABDOMEN - 1 VIEW  COMPARISON:  None.  FINDINGS: Gas is seen in nondilated small bowel and colon. Scattered stool in the colon. Surgical clips in the right upper quadrant and anatomic pelvis.  IMPRESSION: Normal bowel gas pattern.  No overt evidence of constipation.   Electronically Signed   By: Lorin Picket M.D.    On: 08/21/2014 09:16   US Abdomen Complete W/elastography  08/18/2014   CLINICAL DATA:  Chronic hepatitis C  EXAM: ULTRASOUND ABDOMEN  ULTRASOUND HEPATIC ELASTOGRAPHY  TECHNIQUE: Sonography of the upper abdomen was performed. In addition, ultrasound elastography evaluation of the liver was performed. A region of interest was placed within the right lobe of the liver. Following application of a compressive sonographic pulse, shear waves were detected in the adjacent hepatic tissue and the shear wave velocity was calculated. Multiple assessments were performed at the selected site. Median shear wave velocity is correlated to a Metavir fibrosis score.  COMPARISON:  None.  FINDINGS: ULTRASOUND ABDOMEN  Gallbladder: Surgically absent.  Common bile duct: Diameter: 6  mm  Liver: No focal lesion identified. Within normal limits in parenchymal echogenicity.  IVC: No abnormality visualized.  Pancreas: Incompletely visualized but grossly unremarkable.  Spleen: Size and appearance within normal limits.  Right Kidney: Length: 13.5 cm. Echogenic renal parenchyma, suggesting medical renal disease. No mass or hydronephrosis.  Left Kidney: Length: 12.8 cm. Echogenic renal parenchyma, suggesting medical renal disease. No mass or hydronephrosis.  Abdominal aorta: No aneurysm visualized.  Other findings: None.  ULTRASOUND HEPATIC ELASTOGRAPHY  Device: Siemens Helix VTQ  Transducer 4V1  Patient position: Supine  Number of measurements:  10  Hepatic Segment:  8  Median velocity:   2.95  m/sec  IQR: 0.51  IQR/Median velocity ratio 0.172  Corresponding Metavir fibrosis score:  F3/F4  Risk of fibrosis: High  Limitations of exam: None  Pertinent findings noted on other imaging exams:  None  Please note that abnormal shear wave velocities may also be identified in clinical settings other than with hepatic fibrosis, such as: acute hepatitis, elevated right heart and central venous pressures including use of beta blockers, veno-occlusive  disease (Budd-Chiari), infiltrative processes such as mastocytosis/amyloidosis/infiltrative tumor, extrahepatic cholestasis, in the post-prandial state, and liver transplantation. Correlation with patient history, laboratory data, and clinical condition recommended.  IMPRESSION: Echogenic renal parenchyma, suggesting medical renal disease.  Status post cholecystectomy.  Median hepatic shear wave velocity is calculated at 2.95 m/sec.  Corresponding Metavir fibrosis score is F3/F4.  Risk of fibrosis is high.  Follow-up:  Follow-up advised.   Electronically Signed   By: Julian Hy M.D.   On: 08/18/2014 16:49    Microbiology: Recent Results (from the past 240 hour(s))  Surgical pcr screen     Status: None   Collection Time: 09/13/14  1:12 AM  Result Value Ref Range Status   MRSA, PCR NEGATIVE NEGATIVE Final   Staphylococcus aureus NEGATIVE NEGATIVE Final    Comment:        The Xpert SA Assay (FDA approved for NASAL specimens in patients over 49 years of age), is one component of a comprehensive surveillance program.  Test performance has been validated by [facility] for patients greater than or equal to 47 year old. It is not intended to diagnose infection nor to guide or monitor treatment.      Labs: Basic Metabolic Panel:  Recent Labs Lab 09/10/14 0430 09/11/14 0510 09/12/14 0501 09/12/14 0700 09/13/14 0534 09/14/14 0950  NA 130* 129* 130*  --  136  135 136  K 4.0 4.4 4.6  --  4.0  3.9 4.6  CL 98 96 96  --  99  99 101  CO2 _0 --  30  33* 26  GLUCOSE 108* 105* 105*  --  127*  125* 109*  BUN 22 24* 27*  --  _1 CREATININE 7.02* 7.56* 7.98*  --  5.34*  5.22* 6.22*  CALCIUM 7.9* 7.9* 8.0* 8.1* 8.1*  7.9* 8.0*  PHOS  --   --  4.9*  --  3.7 4.7*   Liver Function Tests:  Recent Labs Lab 09/12/14 0501 09/13/14 0534 09/14/14 0950  ALBUMIN 1.3* 1.4* 1.4*   No results for input(s): LIPASE, AMYLASE in the last 168 hours. No results for input(s):  AMMONIA in the last 168 hours. CBC:  Recent Labs Lab 09/10/14 0430 09/11/14 0510 09/12/14 0500 09/13/14 0534 09/14/14 0950  WBC 5.6 6.6 5.7 6.7 7.4  HGB 7.8* 7.9* 7.8* 8.0* 8.1*  HCT 25.0* 25.0* 24.5* 26.0* 26.3*  MCV 93.3 94.0 92.8 94.5 94.9  PLT 312 328 349 359 385   Cardiac Enzymes: No results for input(s): CKTOTAL, CKMB, CKMBINDEX, TROPONINI in the last 168 hours. BNP: BNP (last 3 results) No results for input(s): BNP in the last 8760 hours.  ProBNP (last 3 results) No results for input(s): PROBNP in the last 8760 hours.  CBG:  Recent Labs Lab 09/13/14 0729 09/13/14 1027 09/13/14 1301 09/13/14 1704 09/13/14 2116  GLUCAP 106* 111* 93 108* 100*       Signed:  Marques Ericson  [facility] 09/14/2014, 3:15 PM

## 2014-09-15 ENCOUNTER — Inpatient Hospital Stay (HOSPITAL_COMMUNITY): Payer: Medicaid Other | Admitting: Occupational Therapy

## 2014-09-15 ENCOUNTER — Inpatient Hospital Stay (HOSPITAL_COMMUNITY): Payer: Medicaid Other | Admitting: Physical Therapy

## 2014-09-15 DIAGNOSIS — N189 Chronic kidney disease, unspecified: Secondary | ICD-10-CM

## 2014-09-15 DIAGNOSIS — E1122 Type 2 diabetes mellitus with diabetic chronic kidney disease: Secondary | ICD-10-CM

## 2014-09-15 LAB — CBC
HCT: 28.8 % — ABNORMAL LOW (ref 36.0–46.0)
Hemoglobin: 9 g/dL — ABNORMAL LOW (ref 12.0–15.0)
MCH: 29.4 pg (ref 26.0–34.0)
MCHC: 31.3 g/dL (ref 30.0–36.0)
MCV: 94.1 fL (ref 78.0–100.0)
Platelets: 437 10*3/uL — ABNORMAL HIGH (ref 150–400)
RBC: 3.06 MIL/uL — ABNORMAL LOW (ref 3.87–5.11)
RDW: 15.6 % — ABNORMAL HIGH (ref 11.5–15.5)
WBC: 12.7 10*3/uL — ABNORMAL HIGH (ref 4.0–10.5)

## 2014-09-15 LAB — GLUCOSE, CAPILLARY
Glucose-Capillary: 126 mg/dL — ABNORMAL HIGH (ref 70–99)
Glucose-Capillary: 142 mg/dL — ABNORMAL HIGH (ref 70–99)
Glucose-Capillary: 121 mg/dL — ABNORMAL HIGH (ref 70–99)

## 2014-09-15 LAB — RENAL FUNCTION PANEL
Albumin: 1.6 g/dL — ABNORMAL LOW (ref 3.5–5.2)
Anion gap: 6 (ref 5–15)
BUN: 10 mg/dL (ref 6–23)
CO2: 27 mmol/L (ref 19–32)
Calcium: 8.3 mg/dL — ABNORMAL LOW (ref 8.4–10.5)
Chloride: 99 mmol/L (ref 96–112)
Creatinine, Ser: 5.24 mg/dL — ABNORMAL HIGH (ref 0.50–1.10)
GFR calc Af Amer: 9 mL/min — ABNORMAL LOW (ref >90)
GFR calc non Af Amer: 8 mL/min — ABNORMAL LOW (ref >90)
Glucose, Bld: 173 mg/dL — ABNORMAL HIGH (ref 70–99)
Phosphorus: 3.3 mg/dL (ref 2.3–4.6)
Potassium: 4.2 mmol/L (ref 3.5–5.1)
Sodium: 132 mmol/L — ABNORMAL LOW (ref 135–145)

## 2014-09-15 MED ORDER — HYDROCERIN EX CREA
1.0000 "application " | TOPICAL_CREAM | Freq: Two times a day (BID) | CUTANEOUS | Status: DC | PRN
  Administered 2014-09-16: 1 via TOPICAL
  Filled 2014-09-15: qty 113

## 2014-09-15 MED ORDER — CEFAZOLIN SODIUM-DEXTROSE 2-3 GM-% IV SOLR: 2.0000 g | INTRAVENOUS | Status: DC

## 2014-09-15 MED ORDER — CEFAZOLIN SODIUM-DEXTROSE 2-3 GM-% IV SOLR
2.0000 g | INTRAVENOUS | Status: DC
  Administered 2014-09-15 – 2014-09-25 (×4): 2 g via INTRAVENOUS
  Filled 2014-09-15 (×8): qty 50

## 2014-09-15 NOTE — Progress Notes (Signed)
Occupational Therapy Session Note  Patient Details  Name: Brandy Houston MRN: [id number] Date of Birth: 1956/05/06  Today's Date: 09/15/2014 OT Individual Time:  -   1400-1500  (60 min)      Short Term Goals: Week 1:  OT Short Term Goal 1 (Week 1): Pt. will bathe self with supervision OT Short Term Goal 2 (Week 1): Pt. will dress UB with supervision OT Short Term Goal 3 (Week 1): Pt will dress LB with minimal assist OT Short Term Goal 4 (Week 1): Pt. will go from sit to stand with min assist OT Short Term Goal 5 (Week 1): Pt. will transfer to toilet with minimal assist      Skilled Therapeutic Interventions/Progress Updates:  Balance/vestibular training;Discharge planning;DME/adaptive equipment instruction;Functional mobility training;Neuromuscular re-education;Pain management;Patient/family education;Self Care/advanced ADL retraining;Splinting/orthotics;Therapeutic Activities;Therapeutic Exercise;UE/LE Strength taining/ROM;UE/LE Coordination activities;Wheelchair propulsion/positioning .  Addressed OT goals and POC.  Practiced going from sit to stand with max assist.  Pt able to stand for 1 minute with max assist.  Kept left knee blocked for stabiltiy.    Therapy Documentation Precautions:  Precautions Precautions: Fall Precaution Comments: Will sit without warning when fatigued; bring chair behind for safety Restrictions Weight Bearing Restrictions: Yes RLE Weight Bearing: Weight bearing as tolerated LLE Weight Bearing: Weight bearing as tolerated      Pain: Pain Assessment Pain Score: 6/10  LUE and LLE        See FIM for current functional status  Therapy/Group: Individual Therapy  Lisa Roca 09/15/2014, 6:20 PM

## 2014-09-15 NOTE — Procedures (Signed)
I have personally attended this patient's dialysis session.   Goal 4 liters but pt was given her BP meds prior to HD - will use albumin for BP support to facilitate UF TDC 400 Tight heparin 2K bath with K 4.2 Now will be on her outpt schedule  Jamal Maes, MD [place] Pager 09/15/2014, 4:03 PM

## 2014-09-15 NOTE — Progress Notes (Signed)
Patient information reviewed and entered into [facility] system by Barret Esquivel, RN, CRRN, PPS Coordinator.  Information including medical coding and functional independence measure will be reviewed and updated through discharge.    

## 2014-09-15 NOTE — Progress Notes (Signed)
[place] PHYSICAL MEDICINE & REHABILITATION     PROGRESS NOTE    Subjective/Complaints: Did fairly well last night. Left arm still tender but more tolerable. Able to sleep  Objective: Vital Signs: Blood pressure 163/74, pulse 97, temperature 98.3 F (36.8 C), temperature source Oral, resp. rate 18, height 5\' 7"  (1.702 m), weight 93.2 kg (205 lb 7.5 oz), last menstrual period 03/16/2002, SpO2 95 %. No results found.  Recent Labs  09/13/14 0534 09/14/14 0950  WBC 6.7 7.4  HGB 8.0* 8.1*  HCT 26.0* 26.3*  PLT 359 385    Recent Labs  09/13/14 0534 09/14/14 0950  NA 136  135 136  K 4.0  3.9 4.6  CL 99  99 101  GLUCOSE 127*  125* 109*  BUN 13  13 15   CREATININE 5.34*  5.22* 6.22*  CALCIUM 8.1*  7.9* 8.0*   CBG (last 3)   Recent Labs  09/14/14 1807 09/14/14 2114 09/15/14 0700  GLUCAP 114* 122* 126*    Wt Readings from Last 3 Encounters:  09/15/14 93.2 kg (205 lb 7.5 oz)  09/14/14 92.2 kg (203 lb 4.2 oz)  08/08/14 89.812 kg (198 lb)    Physical Exam:  Constitutional: She is oriented to person, place, and time. She appears well-developed and well-nourished.  HENT:  Head: Normocephalic and atraumatic.  Eyes: Conjunctivae are normal. Pupils are equal, round, and reactive to light.  Neck: Normal range of motion. Neck supple.  Cardiovascular: Normal rate, regular rhythm and normal heart sounds.  Respiratory: Effort normal and breath sounds normal.  GI: Soft. Bowel sounds are normal.  Musculoskeletal: She exhibits edema (1+ edema L-forearm with allodynia. ).  Healing incisions bilateral knees with edema L>R. Left knee with pain on ROM. Moves RUE without difficulty. LUE still tender along AVG site. Mild edema and warmth noted  Neurological: She is alert and oriented to person, place, and time.  Skin: Skin is warm and dry.  Psychiatric: Her speech is normal. Judgment and thought content normal. Her mood appears pleasant, slightly anxious.      Assessment/Plan: 1. Functional deficits secondary to septic joints with subsequent debilitation which require 3+ hours per day of interdisciplinary therapy in a comprehensive inpatient rehab setting. Physiatrist is providing close team supervision and 24 hour management of active medical problems listed below. Physiatrist and rehab team continue to assess barriers to discharge/monitor patient progress toward functional and medical goals. FIM:                   Comprehension Comprehension Mode: Auditory Comprehension: 5-Understands complex 90% of the time/Cues < 10% of the time  Expression Expression Mode: Verbal Expression: 6-Expresses complex ideas: With extra time/assistive device  Social Interaction Social Interaction: 6-Interacts appropriately with others with medication or extra time (anti-anxiety, antidepressant).  Problem Solving Problem Solving Mode: Asleep  Memory Memory: 5-Recognizes or recalls 90% of the time/requires cueing < 10% of the time  Medical Problem List and Plan: 1. 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 DVT Prophylaxis/Anticoagulation: Pharmaceutical: Lovenox 3. Pain Management:   oxycodone 10 mg prn.   -anxiety mgt 4. Adjustment reaction/ Mood: Team to provide ego support. Patient not interested in antidepressant at this time. LCSW to follow for evaluation and support.  5. Neuropsych: This patient is capable of making decisions on her own behalf. 6. Skin/Wound Care: Routine pressure relief measures. Elevate LUE when in bed to help with edema control 7. Fluids/Electrolytes/Nutrition: Needs 1200 FR  due to ESRD. Routine check of lytes with HD. 8. ESRD: Schedule HD past therapy sessions to help with tolerance and endurance. on 9. Septic arthritis:  continue Ancef D# 36/56---thru 10/04/14. Wbc's down 10. DM type 2 with neuropathy: Resume gabapentin as complaining of  significant neuropathic symptoms BLE. Continue to monitor BS ac/hs basis and use SSI for elevated BS. Off glipizide and currently diet controlled as po intake is poor. Will add nepro for supplement.  11. HTN: Monitor every 8 hours. Continue Hydralazine bid.   LOS (Days) 1 A FACE TO FACE EVALUATION WAS PERFORMED  SWARTZ,ZACHARY T 09/15/2014 8:26 AM

## 2014-09-15 NOTE — Progress Notes (Signed)
Occupational Therapy Assessment and Plan  Patient Details  Name: Brandy Houston MRN: [id number] Date of Birth: 15-Oct-1955  OT Diagnosis: abnormal posture, muscle weakness (generalized), pain in joint and swelling of limb Rehab Potential: Rehab Potential (ACUTE ONLY): Good ELOS: 14-16 days   Today's Date: 09/15/2014 OT Individual Time:1030-1130  OT Individual Time Calculation (min): 60 min     Problem List:  Patient Active Problem List   Diagnosis Date Noted  . Physical debility 09/14/2014  . Acute renal failure syndrome   . Hep C w/ coma, chronic   . Joint infection   . Acute renal failure   . Hepatitis C 08/16/2014  . SIRS (systemic inflammatory response syndrome)   . Benign essential HTN   . Septic joint of right hand   . Staphylococcus aureus bacteremia   . Septic joint of right knee joint   . ESRD (end stage renal disease) on dialysis   . Tachycardia 08/07/2014  . Septic joint of left shoulder region 08/07/2014  . Sinus tachycardia 08/07/2014  . BP (high blood pressure) 06/24/2013  . Disorder of peripheral nervous system 06/24/2013  . 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 Diabetes mellitus, type 2 06/24/2013  . Uncontrolled hypertension 02/18/2013  . Type II or unspecified type diabetes mellitus without mention of complication, uncontrolled 02/18/2013  . Chest pain at rest 10/18/2011    Class: Acute  . Shortness of breath 10/18/2011    Class: Acute    Past Medical History:  Past Medical History  Diagnosis Date  . Diabetes mellitus   . Hypertension   . Peripheral neuropathy    Past Surgical History:  Past Surgical History  Procedure Laterality Date  . Cholecystectomy    . Appendectomy    . Small intestine surgery      Due to Small Bowel Obstruction  . Tubal ligation    . Knee arthroscopy Right 08/10/2014    Procedure: ARTHROSCOPY I & D KNEE;  Surgeon: Marianna Payment, MD;  Location: [facility];  Service: Orthopedics;  Laterality: Right;  . Shoulder  arthroscopy Bilateral 08/10/2014    Procedure: I & D BILATERAL SHOULDERS ;  Surgeon: Marianna Payment, MD;  Location: [facility];  Service: Orthopedics;  Laterality: Bilateral;  . Tenosynovectomy Right 08/11/2014    Procedure: RIGHT WRIST IRRIGATION AND DEBRIDEMENT, TENOSYNOVECTOMY;  Surgeon: Marianna Payment, MD;  Location: [place];  Service: Orthopedics;  Laterality: Right;  . Knee arthroscopy Left 08/11/2014    Procedure: ARTHROSCOPIC WASHOUT LEFT KNEE;  Surgeon: Marianna Payment, MD;  Location: [place];  Service: Orthopedics;  Laterality: Left;  . Tee without cardioversion N/A 08/14/2014    Procedure: TRANSESOPHAGEAL ECHOCARDIOGRAM (TEE);  Surgeon: Thayer Headings, MD;  Location: [place];  Service: Cardiovascular;  Laterality: N/A;  . Knee arthroscopy Left 08/19/2014    Procedure: ARTHROSCOPIC WASHOUT LEFT KNEE;  Surgeon: Leandrew Koyanagi, MD;  Location: [place];  Service: Orthopedics;  Laterality: Left;  . Av fistula placement Left 09/13/2014    Procedure: Brachial Artery to Brachial Vein Gortex Four - Seven Stretch GRAFT INSERTION Left Forearm;  Surgeon: Mal Misty, MD;  Location: [place];  Service: Vascular;  Laterality: Left;    Assessment & Plan Clinical Impression:  HPI: [name] is a 59yo AAF with PMH sig for poorly controlled DM, HTN, CKD (baseline Scr 1.3-1.6) and new diagnosis of Hep C infection who was admitted to [facility] 08/07/14 with left shoulder pain. She had an elevated D-Dimer and underwent CT-angio to  r/o PE on 08/07/14 and was admitted for further evaluation. work up revealed MSSA bacteremia with septic arthritis of bilateral AC joints, bilateral knees and right hand flexor tendon sheath. She underwent I and D bilateral knees and I and D with drainage of abscess of bilateral shoulder by Dr. [name] on 08/10/14. She has required repeat I and D left knee on 02/26 and 03/05. Dr. Kathrynn Speed consulted for right hand and forearm infection and patient underwent I and D of right flexor  tendon sheath and right volar forearm on 02/27. 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 UOP remains poor and kidneys without recovery despite treatment of infection and patient has been dialysis dependent since 08/20/14.   TEE negative for endocarditis. Antibiotics narrowed to Ancef and patient to continue for 8 weeks duration using 2/26 as day 1/56 per Dr. Baxter Flattery. She will need treatment for HCV past completing her antibiotic regimen for MSSA infection. She had febrile episode on 03/19 due to pseudomonas UTI and was treated with cipro X 5 days. Anemia of chronic disease treated with iron load as well as aranesp with recommendations for transfusion prn Hgb < 7.0. AVG placed by Dr. Kellie Simmering on 03/30 and patient has had significant pain LUE with hypersensitivity affecting mobility. Po intake remains poor with decreased energy levels. Therapy ongoing and CIR recommended due to deconditioned state.   Patient transferred to CIR on 09/14/2014 .    Patient currently requires max with basic self-care skills secondary to muscle weakness and muscle joint tightness, decreased cardiorespiratoy endurance and impaired timing and sequencing, abnormal tone, unbalanced muscle activation, motor apraxia, decreased coordination and decreased motor planning.  Prior to hospitalization, patient could complete BADL with independent .  Patient will benefit from skilled intervention to increase independence with basic self-care skills prior to discharge home with care partner.  Anticipate patient will require intermittent supervision and follow up home health.  OT - End of Session Activity Tolerance: Tolerates < 10 min activity, no significant change in vital signs Endurance Deficit: Yes Endurance Deficit Description: requires seated rest breaks with functional  mobility OT Assessment Rehab Potential (ACUTE ONLY): Good Barriers to Discharge:  (none) OT Patient demonstrates impairments in the following area(s): Balance;Edema;Endurance;Motor;Pain;Safety;Skin Integrity OT Basic ADL's Functional Problem(s): Grooming OT Advanced ADL's Functional Problem(s): Simple Meal Preparation;Laundry OT Transfers Functional Problem(s): Toilet;Tub/Shower OT Additional Impairment(s): Fuctional Use of Upper Extremity OT Plan OT Intensity: Minimum of 1-2 x/day, 45 to 90 minutes OT Frequency: 5 out of 7 days OT Duration/Estimated Length of Stay: 14-16 days OT Treatment/Interventions: Balance/vestibular training;Discharge planning;DME/adaptive equipment instruction;Functional mobility training;Neuromuscular re-education;Pain management;Patient/family education;Self Care/advanced ADL retraining;Splinting/orthotics;Therapeutic Activities;Therapeutic Exercise;UE/LE Strength taining/ROM;UE/LE Coordination activities;Wheelchair propulsion/positioning OT Self Feeding Anticipated Outcome(s): independent OT Basic Self-Care Anticipated Outcome(s): mod I OT Toileting Anticipated Outcome(s): mod I OT Bathroom Transfers Anticipated Outcome(s): mod I OT Recommendation Patient destination: Home Follow Up Recommendations: Home health OT Equipment Recommended: 3 in 1 bedside comode;Tub/shower bench   Skilled Therapeutic Intervention OT addressed functional balance, BUE strength and coordination, standing tolerance.  Explained OT treatment and purpose.    OT Evaluation Precautions/Restrictions  Precautions Precautions: Fall Precaution Comments: Will sit without warning when fatigued; bring chair behind for safety Restrictions Weight Bearing Restrictions: Yes RLE Weight Bearing: Weight bearing as tolerated General   Vital Signs Therapy Vitals Temp: 98.1 F (36.7 C) Temp Source: Oral Pulse  Rate: 100 Resp: 18 BP: (!) 160/82 mmHg Patient Position (if appropriate):  Lying Oxygen Therapy SpO2: 98 % O2 Device: Not Delivered Pain Pain Assessment Pain Assessment: No/denies pain Home Living/Prior Functioning Home Living Family/patient expects to be discharged to:: Private residence Living Arrangements: Children (Son ) Available Help at Discharge: Family, Available 24 hours/day Type of Home: House Home Access: Stairs to enter Technical brewer of Steps: 4 Entrance Stairs-Rails: None Home Layout: Two level, Able to live on main level with bedroom/bathroom  Lives With: Son (his gf and 2 children) IADL History Homemaking Responsibilities: Yes Meal Prep Responsibility: Primary Laundry Responsibility: Primary Cleaning Responsibility: Secondary (Pt. 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 Prior Function Level of Independence: Independent with homemaking with ambulation, Independent with gait, Independent with basic ADLs  Able to Take Stairs?: Yes Driving: No Vocation: Part time employment Vocation Requirements:  (works 101 huurs week) Leisure:  (word puzzles, draws) Comments:  (puzzles, indoor plants) ADL   Vision/Perception  Vision- Assessment Eye Alignment: Within Functional Limits Perception Perception: Within Functional Limits Praxis Praxis: Intact  Cognition Orientation Level: Oriented X4 Attention: Selective Selective Attention: Appears intact Memory: Appears intact Awareness: Appears intact Problem Solving: Appears intact Executive Function: Reasoning;Sequencing;Decision Making Sequencing: Appears intact Safety/Judgment: Appears intact Sensation Sensation Light Touch: Impaired by gross assessment Coordination Gross Motor Movements are Fluid and Coordinated: No Fine Motor Movements are Fluid and Coordinated: No Motor  Motor Motor: Abnormal postural alignment  and control Motor - Skilled Clinical Observations: forward flexed posture, mobility limited by pain/edema in BUE/BLE Mobility  Bed Mobility Bed Mobility: Supine to Sit Supine to Sit: HOB flat;4: Min assist Transfers Sit to Stand: 2: Max assist Sit to Stand Details: Verbal cues for technique;Verbal cues for sequencing;Verbal cues for precautions/safety;Manual facilitation for weight shifting Stand to Sit: 2: Max assist Stand to Sit Details (indicate cue type and reason): Verbal cues for technique;Verbal cues for sequencing;Verbal cues for precautions/safety;Manual facilitation for weight shifting  Trunk/Postural Assessment  Cervical Assessment Cervical Assessment: Within Functional Limits Thoracic Assessment Thoracic Assessment: Exceptions to [facility] Lumbar Assessment Lumbar Assessment: Exceptions to [facility] Postural Control Postural Control: Deficits on evaluation Protective Responses: delayed/impaired  Balance Balance Balance Assessed: Yes Dynamic Sitting Balance Sitting balance - Comments: Sat EOB for 15 minutes with lateral weight shifting and no LOB Static Standing Balance Static Standing - Balance Support: During functional activity;Bilateral upper extremity supported Static Standing - Level of Assistance: 2: Max assist Dynamic Standing Balance Dynamic Standing - Balance Support: Right upper extremity supported Dynamic Standing - Level of Assistance: 2: Max assist Extremity/Trunk Assessment RUE Assessment RUE Assessment: Exceptions to [facility] RUE AROM (degrees) Overall AROM Right Upper Extremity: Deficits (4/5) RUE Strength RUE Overall Strength: Deficits LUE Assessment LUE Assessment: Exceptions to WFL LUE Tone LUE Tone Comments:  (edema, limited AROM in shoulder, elbow and wrist, and finger)  FIM: SEE FIM     Refer to Care Plan for Long Term Goals  Recommendations for other services: None  Discharge Criteria: Patient will be discharged from OT if patient refuses treatment  3 consecutive times without medical reason, if treatment goals not met, if there is a change in medical status, if patient makes no progress towards goals or if patient is discharged from hospital.  The above assessment, treatment plan, treatment alternatives and goals were discussed and mutually agreed upon: by patient  Lisa Roca 09/15/2014,  7:15 PM

## 2014-09-15 NOTE — Care Management Note (Signed)
[facility] Individual Statement of Services  Patient Name:  Refugio Brekken  Date:  09/15/2014  Welcome to the [facility].  Our goal is to provide you with an individualized program based on your diagnosis and situation, designed to meet your specific needs.  With this comprehensive rehabilitation program, you will be expected to participate in at least 3 hours of rehabilitation therapies Monday-Friday, with modified therapy programming on the weekends.  Your rehabilitation program will include the following services:  Physical Therapy (PT), Occupational Therapy (OT), 24 hour per day rehabilitation nursing, Therapeutic Recreaction (TR), Neuropsychology, Case Management (Social Worker), Rehabilitation Medicine, Nutrition Services and Pharmacy Services  Weekly team conferences will be held on Tuesdays to discuss your progress.  Your Social Worker will talk with you frequently to get your input and to update you on team discussions.  Team conferences with you and your family in attendance may also be held.  Expected length of stay: 10-14 days  Overall anticipated outcome: modified independent  Depending on your progress and recovery, your program may change. Your Social Worker will coordinate services and will keep you informed of any changes. Your Social Worker's name and contact numbers are listed  below.  The following services may also be recommended but are not provided by the [facility] will be made to provide these services after discharge if needed.  Arrangements include referral to agencies that provide these services.  Your insurance has been verified to be:  None currently - Medicaid and Disability applications being processed Your primary doctor is:  Chari Manning @ [facility]  Pertinent information will be shared with your doctor and your insurance company.  Social Worker:  [place], [place] or ([phone]   Information discussed with and copy given to patient by: Lennart Pall, 09/15/2014, 3:01 PM

## 2014-09-15 NOTE — IPOC Note (Addendum)
Overall Plan of Care [facility]) Patient Details Name: Brandy Houston MRN: [id number] DOB: 01/29/1956  Admitting Diagnosis: DECONDITIONED  NEW TID  Hospital Problems: Principal Problem:   Septic joint of right knee joint Active Problems:   Diabetes mellitus, type 2   Septic joint of left shoulder region   Physical debility     Functional Problem List: Nursing Bowel, Bladder, Medication Management, Edema, Pain, Safety, Endurance  PT Balance, Edema, Endurance, Motor, Nutrition, Pain, Safety, Sensory, Skin Integrity  OT Balance, Edema, Endurance, Motor, Pain, Safety, Skin Integrity  SLP    TR Activity tolerance, functional mobility, balance, safety, pain, skin integrity       Basic ADL's: OT Grooming     Advanced  ADL's: OT Simple Meal Preparation, Laundry     Transfers: PT Bed Mobility, Bed to Chair, Car, Manufacturing systems engineer, Metallurgist: PT Ambulation, Stairs     Additional Impairments: OT Fuctional Use of Upper Extremity  SLP        TR      Anticipated Outcomes Item Anticipated Outcome  Self Feeding independent  Swallowing      Basic self-care  mod I  Toileting  mod I   Bathroom Transfers mod I  Bowel/Bladder  Indep  Transfers  mod I   Locomotion  mod I household ambulator  Communication     Cognition     Pain  <3  Safety/Judgment  mod I   Therapy Plan: PT Intensity: Minimum of 1-2 x/day ,45 to 90 minutes PT Frequency: 5 out of 7 days PT Duration Estimated Length of Stay: 10-14 days OT Intensity: Minimum of 1-2 x/day, 45 to 90 minutes OT Frequency: 5 out of 7 days OT Duration/Estimated Length of Stay: 14-16 days   TR Duration/ELOS:  10 days TR Frequency:  Min 1 time per week >20 minutes        Team Interventions: Nursing Interventions Patient/Family Education, Pain Management, Medication Management, Bowel Management, Disease Management/Prevention  PT interventions Ambulation/gait training, Training and development officer,  Community reintegration, Discharge planning, Disease management/prevention, DME/adaptive equipment instruction, Functional mobility training, Neuromuscular re-education, Pain management, Patient/family education, Psychosocial support, Stair training, Therapeutic Activities, Therapeutic Exercise, UE/LE Strength taining/ROM, UE/LE Coordination activities  OT Interventions Balance/vestibular training, Discharge planning, DME/adaptive equipment instruction, Functional mobility training, Neuromuscular re-education, Pain management, Patient/family education, Self Care/advanced ADL retraining, Splinting/orthotics, Therapeutic Activities, Therapeutic Exercise, UE/LE Strength taining/ROM, UE/LE Coordination activities, Wheelchair propulsion/positioning  SLP Interventions    TR Interventions Recreation/leisure participation, Balance/Vestibular training, functional mobility, therapeutic activities, UE/LE strength/coordination, w/c mobility, community reintegration, pt/family education, adaptive equipment instruction/use, discharge planning, psychosocial support  SW/CM Interventions Discharge Planning, Barrister's clerk, Patient/Family Education    Team Discharge Planning: Destination: PT-Home ,OT- Home , SLP-  Projected Follow-up: PT-Home health PT, OT-  Home health OT, SLP-  Projected Equipment Needs: PT-Wheelchair cushion (measurements), Wheelchair (measurements), Rolling walker with 5" wheels, OT- 3 in 1 bedside comode, Tub/shower bench, SLP-  Equipment Details: PT- , OT-  Patient/family involved in discharge planning: PT- Patient,  OT-Patient, SLP-   MD ELOS: 10-14 Medical Rehab Prognosis:  Excellent Assessment: The patient has been admitted for CIR therapies with the diagnosis of septic joints, deconditionin. The team will be addressing functional mobility, strength, stamina, balance, safety, adaptive techniques and equipment, self-care, bowel and bladder mgt, patient and caregiver education, pain mgt,  ego support, community reintegration. Goals have been set at mod IMeredith Staggers, MD, [facility]      See Team Conference  Notes for weekly updates to the plan of care

## 2014-09-15 NOTE — Evaluation (Signed)
Physical Therapy Assessment and Plan  Patient Details  Name: Brandy Houston MRN: [id number] Date of Birth: Apr 22, 1956  PT Diagnosis: Difficulty walking, Edema, Impaired sensation, Muscle weakness and Pain in joint Rehab Potential: Good ELOS: 10-14 days   Today's Date: 09/15/2014 PT Individual Time: 0830-0930 PT Individual Time Calculation (min): 60 min    Problem List:  Patient Active Problem List   Diagnosis Date Noted  . Physical debility 09/14/2014  . Acute renal failure syndrome   . Hep C w/ coma, chronic   . Joint infection   . Acute renal failure   . Hepatitis C 08/16/2014  . SIRS (systemic inflammatory response syndrome)   . Benign essential HTN   . Septic joint of right hand   . Staphylococcus aureus bacteremia   . Septic joint of right knee joint   . ESRD (end stage renal disease) on dialysis   . Tachycardia 08/07/2014  . Septic joint of left shoulder region 08/07/2014  . Sinus tachycardia 08/07/2014  . BP (high blood pressure) 06/24/2013  . Disorder of peripheral nervous system 06/24/2013  . 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 Diabetes mellitus, type 2 06/24/2013  . Uncontrolled hypertension 02/18/2013  . Type II or unspecified type diabetes mellitus without mention of complication, uncontrolled 02/18/2013  . Chest pain at rest 10/18/2011    Class: Acute  . Shortness of breath 10/18/2011    Class: Acute    Past Medical History:  Past Medical History  Diagnosis Date  . Diabetes mellitus   . Hypertension   . Peripheral neuropathy    Past Surgical History:  Past Surgical History  Procedure Laterality Date  . Cholecystectomy    . Appendectomy    . Small intestine surgery      Due to Small Bowel Obstruction  . Tubal ligation    . Knee arthroscopy Right 08/10/2014    Procedure: ARTHROSCOPY I & D KNEE;  Surgeon: Marianna Payment, MD;  Location: [facility];  Service: Orthopedics;  Laterality: Right;  . Shoulder arthroscopy Bilateral 08/10/2014     Procedure: I & D BILATERAL SHOULDERS ;  Surgeon: Marianna Payment, MD;  Location: [facility];  Service: Orthopedics;  Laterality: Bilateral;  . Tenosynovectomy Right 08/11/2014    Procedure: RIGHT WRIST IRRIGATION AND DEBRIDEMENT, TENOSYNOVECTOMY;  Surgeon: Marianna Payment, MD;  Location: [place];  Service: Orthopedics;  Laterality: Right;  . Knee arthroscopy Left 08/11/2014    Procedure: ARTHROSCOPIC WASHOUT LEFT KNEE;  Surgeon: Marianna Payment, MD;  Location: [place];  Service: Orthopedics;  Laterality: Left;  . Tee without cardioversion N/A 08/14/2014    Procedure: TRANSESOPHAGEAL ECHOCARDIOGRAM (TEE);  Surgeon: Thayer Headings, MD;  Location: [place];  Service: Cardiovascular;  Laterality: N/A;  . Knee arthroscopy Left 08/19/2014    Procedure: ARTHROSCOPIC WASHOUT LEFT KNEE;  Surgeon: Leandrew Koyanagi, MD;  Location: [place];  Service: Orthopedics;  Laterality: Left;  . Av fistula placement Left 09/13/2014    Procedure: Brachial Artery to Brachial Vein Gortex Four - Seven Stretch GRAFT INSERTION Left Forearm;  Surgeon: Mal Misty, MD;  Location: [place];  Service: Vascular;  Laterality: Left;    Assessment & Plan Clinical Impression: [name] is a 59yo AAF with PMH sig for poorly controlled DM, HTN, CKD (baseline Scr 1.3-1.6) and new diagnosis of Hep C infection who was admitted to [name] 08/07/14 with left shoulder pain. She had an elevated D-Dimer and underwent CT-angio to r/o PE on 08/07/14 and was admitted for  further evaluation. work up revealed MSSA bacteremia with septic arthritis of bilateral AC joints, bilateral knees and right hand flexor tendon sheath. She underwent I and D bilateral knees and I and D with drainage of abscess of bilateral shoulder by Dr. [name] on 08/10/14. She has required repeat I and D left knee on 02/26 and 03/05. Dr. Kathrynn Speed consulted for right hand and forearm infection and patient underwent I and D of right flexor tendon sheath and right volar forearm on 02/27.  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 UOP remains poor and kidneys without recovery despite treatment of infection and patient has been dialysis dependent since 08/20/14.   TEE negative for endocarditis. Antibiotics narrowed to Ancef and patient to continue for 8 weeks duration using 2/26 as day 1/56 per Dr. Baxter Flattery. She will need treatment for HCV past completing her antibiotic regimen for MSSA infection. She had febrile episode on 03/19 due to pseudomonas UTI and was treated with cipro X 5 days. Anemia of chronic disease treated with iron load as well as aranesp with recommendations for transfusion prn Hgb < 7.0. AVG placed by Dr. Kellie Simmering on 03/30 and patient has had significant pain LUE with hypersensitivity affecting mobility. Po intake remains poor with decreased energy levels. Therapy ongoing and CIR recommended due to deconditioned state. Patient transferred to CIR on 09/14/2014 .   Patient currently requires mod-max with mobility secondary to muscle weakness, pain in B shoulder and knee joints, decreased balance and balance strategies, decreased cardiorespiratory endurance, and edema.  Prior to hospitalization, patient was independent  with mobility and lived with Son (son, gf, and her 2 children) in a House home.  Home access is 4Stairs to enter.  Patient will benefit from skilled PT intervention to maximize safe functional mobility, minimize fall risk and decrease caregiver burden for planned discharge home with intermittent assist.  Anticipate patient will benefit from follow up [facility] at discharge.  PT - End of Session Activity Tolerance: Decreased this session;Tolerates 30+ min activity with multiple rests Endurance Deficit: Yes Endurance Deficit Description: requires seated rest breaks with functional mobility PT  Assessment Rehab Potential (ACUTE/IP ONLY): Good Barriers to Discharge: Inaccessible home environment;Decreased caregiver support PT Patient demonstrates impairments in the following area(s): Balance;Edema;Endurance;Motor;Nutrition;Pain;Safety;Sensory;Skin Integrity PT Transfers Functional Problem(s): Bed Mobility;Bed to Chair;Car;Furniture PT Locomotion Functional Problem(s): Ambulation;Stairs PT Plan PT Intensity: Minimum of 1-2 x/day ,45 to 90 minutes PT Frequency: 5 out of 7 days PT Duration Estimated Length of Stay: 10-14 days PT Treatment/Interventions: Ambulation/gait training;Balance/vestibular training;Community reintegration;Discharge planning;Disease management/prevention;DME/adaptive equipment instruction;Functional mobility training;Neuromuscular re-education;Pain management;Patient/family education;Psychosocial support;Stair training;Therapeutic Activities;Therapeutic Exercise;UE/LE Strength taining/ROM;UE/LE Coordination activities PT Transfers Anticipated Outcome(s): mod I  PT Locomotion Anticipated Outcome(s): mod I household ambulator PT Recommendation Recommendations for Other Services: Neuropsych consult Follow Up Recommendations: Home health PT Patient destination: Home Equipment Recommended: Wheelchair cushion (measurements);Wheelchair (measurements);Rolling walker with 5" wheels  Skilled Therapeutic Intervention Skilled therapeutic intervention initiated after completion of evaluation. Discussed with patient falls risk, safety within room, and focus of therapy during stay. Discussed possible LOS, goals, and f/u therapy.  PT Evaluation Precautions/Restrictions Restrictions Weight Bearing Restrictions: Yes RLE Weight Bearing: Weight bearing as tolerated LLE Weight Bearing: Weight bearing as tolerated General Chart Reviewed: Yes Family/Caregiver Present: No Vital SignsTherapy Vitals Pulse Rate: 99 BP: (!) 156/75 mmHg Patient Position (if appropriate): Sitting  (after ambulation) Oxygen  Therapy SpO2: 98 % O2 Device: Not Delivered Pain Pain Assessment Pain Assessment: 0-10 Pain Score: 5  Pain Type: Acute pain Pain Location: Shoulder Pain Orientation: Right;Left Pain Descriptors / Indicators: Aching;Sore;Tightness Pain Frequency: Constant Pain Onset: On-going Pain Intervention(s): Repositioned;Emotional support;Rest 2nd Pain Site Pain Score: 7 Pain Type: Surgical pain Pain Location: Leg Pain Orientation: Right;Left Pain Descriptors / Indicators: Aching Pain Frequency: Constant Pain Onset: On-going Pain Intervention(s): Medication (See eMAR) Home Living/Prior Functioning Home Living Available Help at Discharge: Family;Available 24 hours/day Type of Home: House Home Access: Stairs to enter [employer] of Steps: 4 Entrance Stairs-Rails: None (have discussed installing rail with son) Home Layout: Two level;Able to live on main level with bedroom/bathroom  Lives With: Son (son, gf, and her 2 children) Prior Function Level of Independence: Independent with gait;Independent with transfers;Independent with homemaking with ambulation;Independent with basic ADLs  Able to Take Stairs?: Yes Driving: No (son drove her to/from work) Vocation: Full time employment 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 Leisure: Hobbies-yes (Comment) Comments: gardening, puzzles  Vision/Perception   No changes from baseline  Cognition Overall Cognitive Status: Within Functional Limits for tasks assessed Arousal/Alertness: Awake/alert Orientation Level: Oriented X4 Sensation Sensation Light Touch: Impaired by gross assessment Hot/Cold: Appears Intact Proprioception: Appears Intact Additional Comments: R lower leg numb per pt report Coordination Gross Motor Movements are Fluid and Coordinated: No Fine Motor Movements are Fluid and Coordinated: No Coordination and Movement Description: limited by pain/edema Heel Shin Test:  impaired BLE due to weakness, edema, and decreased ROM Motor  Motor Motor: Abnormal postural alignment and control Motor - Skilled Clinical Observations: forward flexed posture, mobility limited by pain/edema in BUE/BLE  Mobility Bed Mobility Bed Mobility: Supine to Sit Supine to Sit: 5: Supervision;HOB flat Transfers Transfers: Yes Sit to Stand: 3: Mod assist;With upper extremity assist;From chair/3-in-1;From bed Sit to Stand Details: Verbal cues for technique;Verbal cues for sequencing;Verbal cues for precautions/safety;Manual facilitation for weight shifting Sit to Stand Details (indicate cue type and reason): blocking L foot to prevent from slipping Stand to Sit: 3: Mod assist;With upper extremity assist;With armrests;To chair/3-in-1 Stand to Sit Details (indicate cue type and reason): Verbal cues for technique;Verbal cues for sequencing;Verbal cues for precautions/safety;Manual facilitation for weight shifting Locomotion  Ambulation Ambulation: Yes Ambulation/Gait Assistance: 1: +2 Total assist;4: Min assist (+2 for w/c follow) Ambulation Distance (Feet): 75 Feet Assistive device: Rolling walker Ambulation/Gait Assistance Details: Verbal cues for technique;Verbal cues for precautions/safety Gait Gait: Yes Gait Pattern: Impaired Gait Pattern: Step-through pattern;Decreased hip/knee flexion - left;Decreased hip/knee flexion - right;Decreased stride length;Antalgic;Trunk flexed Gait velocity: Decreased Stairs / Additional Locomotion Stairs: Yes Stairs Assistance: 2: Max Industrial/product designer Assistance Details: Verbal cues for technique;Verbal cues for precautions/safety;Verbal cues for sequencing Stair Management Technique: Two rails;Step to pattern;Forwards Number of Stairs: 3 Height of Stairs: 6 Ramp: Not tested (comment) Curb: Not tested (comment) Architect: Yes Wheelchair Assistance: 5: Investment banker, operational Details: Verbal cues for  technique;Verbal cues for Information systems manager: Both upper extremities Wheelchair Parts Management: Needs assistance Distance: 30 ft, limited by LUE pain  Trunk/Postural Assessment  Cervical Assessment Cervical Assessment: Exceptions to [facility] (forward head) Thoracic Assessment Thoracic Assessment: Exceptions to [facility] (kyphotic) Lumbar Assessment Lumbar Assessment: Exceptions to [facility] (posterior pelvic tilt) Postural Control Postural Control: Deficits on evaluation Protective Responses: delayed/impaired  Balance Balance Balance Assessed: Yes Static Standing Balance Static Standing - Balance Support: During functional activity;Bilateral upper extremity supported Static Standing - Level of Assistance: 4: Min assist Dynamic Standing  Balance Dynamic Standing - Balance Support: Bilateral upper extremity supported;During functional activity Dynamic Standing - Level of Assistance: 3: Mod assist Extremity Assessment  RLE Assessment RLE Assessment: Exceptions to [facility] RLE Strength RLE Overall Strength: Deficits RLE Overall Strength Comments: grossly 3+/5 . edema LLE Assessment LLE Assessment: Exceptions to [facility] LLE Strength LLE Overall Strength: Deficits LLE Overall Strength Comments: grossly 3-/5 and painful, edema  FIM:  FIM - Control and instrumentation engineer Devices: Walker;Arm rests Bed/Chair Transfer: 5: Supine > Sit: Supervision (verbal cues/safety issues);2: Bed > Chair or W/C: Max A (lift and lower assist);2: Chair or W/C > Bed: Max A (lift and lower assist) FIM - Locomotion: Wheelchair Distance: 30 ft, limited by LUE pain Locomotion: Wheelchair: 1: Travels less than 50 ft with supervision, cueing or coaxing (limited by LUE pain) FIM - Locomotion: Ambulation Locomotion: Ambulation Assistive Devices: Administrator Ambulation/Gait Assistance: 1: +2 Total assist;4: Min assist (+2 for w/c follow) Locomotion: Ambulation: 1: Two helpers FIM -  Locomotion: Stairs Locomotion: Scientist, physiological: Insurance account manager - 2 Locomotion: Stairs: 1: Up and Down < 4 stairs with maximal assistance (Pt: 25 - 49%)   Refer to Care Plan for Long Term Goals  Recommendations for other services: Neuropsych  Discharge Criteria: Patient will be discharged from PT if patient refuses treatment 3 consecutive times without medical reason, if treatment goals not met, if there is a change in medical status, if patient makes no progress towards goals or if patient is discharged from hospital.  The above assessment, treatment plan, treatment alternatives and goals were discussed and mutually agreed upon: by patient  Laretta Alstrom 09/15/2014, 9:57 AM

## 2014-09-15 NOTE — Progress Notes (Signed)
[facility] Rounding Note Subjective:  Transferred to rehab "They are wearing me out" Unfortunately was given all her BP meds before coming into HD Arm pain a little less  Objective Vital signs in last 24 hours: Filed Vitals:   09/15/14 1500 09/15/14 1505 09/15/14 1527 09/15/14 1549  BP: 157/83 153/90 166/85 156/87  Pulse: 95 93 91   Temp: 98.2 F (36.8 C)     TempSrc: Oral     Resp: 16 15    Height:      Weight: 93 kg (205 lb 0.4 oz)     SpO2: 98%      Weight change:   Intake/Output Summary (Last 24 hours) at 09/15/14 1553 Last data filed at 09/15/14 1224  Gross per 24 hour  Intake    480 ml  Output      0 ml  Net    480 ml   Physical Exam:  BP 156/87 mmHg  Pulse 91  Temp(Src) 98.2 F (36.8 C) (Oral)  Resp 15  Ht _0  (1.702 m)  Wt 93 kg (205 lb 0.4 oz)  BMI 32.10 kg/m2  SpO2 98%  LMP 03/16/2002 VS as noted TDC in use for HD Lungs clear anteriorly Abd soft not tender +BS 2+ edema bilat lower extremities  Right IJ TDC non-tender (3/15) Left IJ PICC line in place  Left FA AVG (3/30) with + bruit. Hand warm. Forearm edema.   Recent Labs Lab 09/09/14 1227 09/10/14 0430 09/11/14 0510 09/12/14 0501 09/12/14 0700 09/13/14 0534 09/14/14 0950 09/15/14 1515  NA 127* 130* 129* 130*  --  136  135 136 132*  K 4.7 4.0 4.4 4.6  --  4.0  3.9 4.6 4.2  CL 95* 98 96 96  --  99  99 101 99  CO2 _1 --  30  33* 26 27  GLUCOSE 134* 108* 105* 105*  --  127*  125* 109* 173*  BUN 35* 22 24* 27*  --  _2 CREATININE 9.01* 7.02* 7.56* 7.98*  --  5.34*  5.22* 6.22* 5.24*  CALCIUM 8.1* 7.9* 7.9* 8.0* 8.1* 8.1*  7.9* 8.0* 8.3*  PHOS  --   --   --  4.9*  --  3.7 4.7* 3.3    Recent Labs Lab 09/13/14 0534 09/14/14 0950 09/15/14 1515  ALBUMIN 1.4* 1.4* 1.6*    Recent Labs Lab 09/12/14 0500 09/13/14 0534 09/14/14 0950 09/15/14 1500  WBC 5.7 6.7 7.4 12.7*  HGB 7.8* 8.0* 8.1* 9.0*  HCT 24.5* 26.0* 26.3* 28.8*  MCV 92.8 94.5  94.9 94.1  PLT 349 359 385 437*     Recent Labs Lab 09/13/14 2116 09/14/14 1807 09/14/14 2114 09/15/14 0700 09/15/14 1148  GLUCAP 100* 114* 122* 126* 142*    Medications:   . antiseptic oral rinse  7 mL Mouth Rinse q12n4p  . aspirin  81 mg Oral Daily  .  ceFAZolin (ANCEF) IV  2 g Intravenous Q M,W,F-HD  . chlorhexidine  15 mL Mouth Rinse BID  . cyclobenzaprine  5 mg Oral BID  . [START ON 09/19/2014] darbepoetin (ARANESP) injection - DIALYSIS  200 mcg Intravenous Q Tue-HD  . enoxaparin (LOVENOX) injection  30 mg Subcutaneous Q24H  . famotidine  20 mg Oral Daily  . feeding supplement (NEPRO CARB STEADY)  237 mL Oral TID WC  . ferric gluconate (FERRLECIT/NULECIT) IV  125 mg Intravenous Q M,W,F-HD  . gabapentin  100 mg Oral BID  .  hydrALAZINE  25 mg Oral BID  . insulin aspart  0-9 Units Subcutaneous TID WC  . lanthanum  1,000 mg Oral TID WC  . metoprolol tartrate  50 mg Oral BID  . multivitamin  1 tablet Oral QHS  . nicotine  21 mg Transdermal Daily  . oxyCODONE  10 mg Oral BID WC  . polyethylene glycol  17 g Oral BID  . sodium chloride  10-40 mL Intracatheter Q12H   Background 59yo AAF with PMH sig for poorly controlled DM, HTN, CKD (baseline Scr 1.3-1.6) and new diagnosis of Hep C infection who was admitted to [facility] with left shoulder pain. She had an elevated D-Dimer and underwent CT-angio to r/o PE on 08/07/14 and was admitted for further evaluation. She then had an MRI with gadolinium on 08/11/14 which suggested septic arthritis. She had another MRI with gadolinium of her hand on 08/08/14 which was consistent with cellulitis. She was felt to have polyarticular septic arthritis and started on Vancomycin and Ancef for 3 days until being changed to nafcillin. She had a negative TEE for SBE . Creatinine was 3.1 on 08/15/14 and rose progressively. Renal biopsy done 08/18/14 showed changes of DM plus diffuse proliferative lesion c/w post-infectious GN. Despite treatment of her infection,  there has been no recovery of kidney function. Dialysis initiated 08/20/14.   Assessment/Recommendations:   AKI on CKD - new ESRD. 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 Recovery dependent on treatment of the underlying cause, but she has shown no improvement despite ATB treatment for her infection. Dialysis dependent since 08/20/14.  We have deemed her ESRD at this time. Her outpt schedule will be MWF at [facility]. EDW not yet established.  Has permanent access  (L AVG - Dr. Kellie Simmering 3/30) and TDC (3/15 IR)  Anemia - On Aranesp. Rec'd feraheme earlier in adm but TSat still low at 78 - will dose ferrelecit with HD for total of 5 doses to start 3/31.  Met bone ds - PTH 72. Phos 3.7. No binder or VDRA needed at this time  MSSA bacteremia with septic polyarthritis - to complete 8 weeks of Ancef. Day 1 was 08/11/24. TEE was negative. Once ready for discharge will be able to receive Ancef at the outpt HD unit to complete the 8 week course..  Malnutrition - supplements. Renal vitamin.  Hep C Ab +  DM per primary  HTN good. Anticipate need for less medication once volume under better control. Haved reduced hydralazine and BB in that regard to allow for fluid removal with HD  Malnutrition - supplements  Rehab - to be completed as inpt   Brandy Maes, MD [place] pager 09/15/2014, 3:53 PM

## 2014-09-16 ENCOUNTER — Inpatient Hospital Stay (HOSPITAL_COMMUNITY): Payer: Medicaid Other | Admitting: Physical Therapy

## 2014-09-16 ENCOUNTER — Inpatient Hospital Stay (HOSPITAL_COMMUNITY): Payer: Medicaid Other | Admitting: Occupational Therapy

## 2014-09-16 DIAGNOSIS — E1142 Type 2 diabetes mellitus with diabetic polyneuropathy: Secondary | ICD-10-CM

## 2014-09-16 LAB — GLUCOSE, CAPILLARY
Glucose-Capillary: 108 mg/dL — ABNORMAL HIGH (ref 70–99)
Glucose-Capillary: 154 mg/dL — ABNORMAL HIGH (ref 70–99)
Glucose-Capillary: 146 mg/dL — ABNORMAL HIGH (ref 70–99)
Glucose-Capillary: 126 mg/dL — ABNORMAL HIGH (ref 70–99)

## 2014-09-16 NOTE — Progress Notes (Signed)
Physical Therapy Session Note  Patient Details  Name: Brandy Houston MRN: [id number] Date of Birth: Sep 12, 1955  Today's Date: 09/16/2014 PT Individual Time: [id number] PT Individual Time Calculation (min): 60 min   Short Term Goals: Week 1:  PT Short Term Goal 1 (Week 1): Patient will perform bed mobility with supervision. PT Short Term Goal 2 (Week 1): Patient will consistently perform sit <> stand with min A. PT Short Term Goal 3 (Week 1): Patient will ambulate 100 ft using RW with supervision. PT Short Term Goal 4 (Week 1): Patient will negotiate up/down 4 stairs using 2 rails with min A.   Skilled Therapeutic Interventions/Progress Updates:   Focus on functional mobility training, standing balance, activity tolerance, and pt/family education. Patient received sitting in wheelchair, son from out of town present for session. Patient propelled wheelchair using BLE > R UE/LE due to increased LLE pain x 75 ft with supervision. Gait training using RW x 25 ft with mod A and +2 for w/c follow, patient demonstrated forward flexed posture and antalgic gait pattern LLE > RLE, BLE appeared to be shaky but no episodes of buckling. Patient performed 5TSS = 120 sec from wheelchair with UE support and use of RW, overall mod A. To challenge dynamic standing balance, patient participated in 3 rounds of horseshoes using RW for unilateral UE support, mod A overall and 2 seated rest breaks. Patient performed alternating step taps x 11 to 5" step using 2 rails for UE support and mod A overall. Patient negotiated up/down two 5" steps x 1 and three 5" steps x 1 using 2 rails with max A, step-to leading with RLE ascending forwards and leading with LLE descending backwards. Patient with increased BLE pain during stair training compared to yesterday. Patient left sitting in wheelchair to return to room with son.   Five times Sit to Stand Test (FTSS) Method: Use a straight back chair with a solid seat that is 16-18" high. Ask  participant to sit on the chair with arms folded across their chest.   Instructions: "Stand up and sit down as quickly as possible 5 times, keeping your arms folded across your chest."   Measurement: Stop timing when the participant stands the 5th time.  TIME: ___120 sec with UE support and RW, mod A___ (in seconds)  Times > 13.6 seconds is associated with increased disability and morbidity (Guralnik, 2000) Times > 15 seconds is predictive of recurrent falls in healthy individuals aged 71 and older (Buatois, et al., 2008) Normal performance values in community dwelling individuals aged 75 and older (Bohannon, 2006): o 60-69 years: 11.4 seconds o 70-79 years: 12.6 seconds o 80-89 years: 14.8 seconds  MCID: ? 2.3 seconds for Vestibular Disorders Mariah Milling, 2006)  Therapy Documentation Precautions:  Precautions Precautions: Fall Precaution Comments: Will sit without warning when fatigued; bring chair behind for safety Restrictions Weight Bearing Restrictions: Yes RLE Weight Bearing: Weight bearing as tolerated LLE Weight Bearing: Weight bearing as tolerated Pain: Pain Assessment Pain Assessment: 0-10 Pain Score: 6  Pain Type: Acute pain Pain Location: Generalized Pain Orientation: Right;Left Pain Onset: With Activity Pain Intervention(s): Repositioned;Rest  See FIM for current functional status  Therapy/Group: Individual Therapy  Laretta Alstrom 09/16/2014, 12:59 PM

## 2014-09-16 NOTE — Progress Notes (Signed)
[facility] Rounding Note Subjective:  Working with PT EX-husband from [place] in to see her  Objective Vital signs in last 24 hours: Filed Vitals:   09/15/14 1900 09/15/14 1905 09/15/14 1919 09/16/14 0610  BP: 150/76 160/82 156/66 145/68  Pulse: 90 100 100 84  Temp:  98.1 F (36.7 C) 98.9 F (37.2 C) 98.4 F (36.9 C)  TempSrc:  Oral Oral Oral  Resp:  '18 18 17  ' Height:      Weight:  89.2 kg (196 lb 10.4 oz) 91.309 kg (201 lb 4.8 oz) 90.5 kg (199 lb 8.3 oz)  SpO2:  98% 99% 97%   Weight change: -0.895 kg (-1 lb 15.6 oz)  Intake/Output Summary (Last 24 hours) at 09/16/14 1246 Last data filed at 09/16/14 1000  Gross per 24 hour  Intake    240 ml  Output   3750 ml  Net  -3510 ml   Physical Exam:  BP 145/68 mmHg  Pulse 84  Temp(Src) 98.4 F (36.9 C) (Oral)  Resp 17  Ht '5\' 7"'  (1.702 m)  Wt 90.5 kg (199 lb 8.3 oz)  BMI 31.24 kg/m2  SpO2 97%  LMP 03/16/2002 VS as noted Up in wheelchair eating lunch TDC dry dressing  Lungs clear anteriorly Abd soft not tender +BS 2+ edema bilat lower extremities  Right IJ TDC non-tender (3/15)  Left FA AVG (3/30) with + bruit. Hand warm. Forearm edema.   Recent Labs Lab 09/10/14 0430 09/11/14 0510 09/12/14 0501 09/12/14 0700 09/13/14 0534 09/14/14 0950 09/15/14 1515  NA 130* 129* 130*  --  136  135 136 132*  K 4.0 4.4 4.6  --  4.0  3.9 4.6 4.2  CL 98 96 96  --  99  99 101 99  CO2 '27 27 25  ' --  30  33* 26 27  GLUCOSE 108* 105* 105*  --  127*  125* 109* 173*  BUN 22 24* 27*  --  '13  13 15 10  ' CREATININE 7.02* 7.56* 7.98*  --  5.34*  5.22* 6.22* 5.24*  CALCIUM 7.9* 7.9* 8.0* 8.1* 8.1*  7.9* 8.0* 8.3*  PHOS  --   --  4.9*  --  3.7 4.7* 3.3    Recent Labs Lab 09/13/14 0534 09/14/14 0950 09/15/14 1515  ALBUMIN 1.4* 1.4* 1.6*    Recent Labs Lab 09/12/14 0500 09/13/14 0534 09/14/14 0950 09/15/14 1500  WBC 5.7 6.7 7.4 12.7*  HGB 7.8* 8.0* 8.1* 9.0*  HCT 24.5* 26.0* 26.3* 28.8*  MCV 92.8 94.5  94.9 94.1  PLT 349 359 385 437*     Recent Labs Lab 09/15/14 0700 09/15/14 1148 09/15/14 2104 09/16/14 0708 09/16/14 1149  GLUCAP 126* 142* 121* 108* 154*    Medications:   . antiseptic oral rinse  7 mL Mouth Rinse q12n4p  . aspirin  81 mg Oral Daily  .  ceFAZolin (ANCEF) IV  2 g Intravenous Q M,W,F-HD  . chlorhexidine  15 mL Mouth Rinse BID  . cyclobenzaprine  5 mg Oral BID  . [START ON 09/19/2014] darbepoetin (ARANESP) injection - DIALYSIS  200 mcg Intravenous Q Tue-HD  . enoxaparin (LOVENOX) injection  30 mg Subcutaneous Q24H  . famotidine  20 mg Oral Daily  . feeding supplement (NEPRO CARB STEADY)  237 mL Oral TID WC  . ferric gluconate (FERRLECIT/NULECIT) IV  125 mg Intravenous Q M,W,F-HD  . gabapentin  100 mg Oral BID  . hydrALAZINE  25 mg Oral BID  . insulin aspart  0-9 Units Subcutaneous TID WC  . lanthanum  1,000 mg Oral TID WC  . metoprolol tartrate  50 mg Oral BID  . multivitamin  1 tablet Oral QHS  . nicotine  21 mg Transdermal Daily  . oxyCODONE  10 mg Oral BID WC  . polyethylene glycol  17 g Oral BID  . sodium chloride  10-40 mL Intracatheter Q12H   Background 59yo AAF with PMH sig for poorly controlled DM, HTN, CKD (baseline Scr 1.3-1.6) and new diagnosis of Hep C infection who was admitted to [facility] with left shoulder pain. She had an elevated D-Dimer and underwent CT-angio to r/o PE on 08/07/14 and was admitted for further evaluation. She then had an MRI with gadolinium on 08/11/14 which suggested septic arthritis. She had another MRI with gadolinium of her hand on 08/08/14 which was consistent with cellulitis. She was felt to have polyarticular septic arthritis and started on Vancomycin and Ancef for 3 days until being changed to nafcillin. She had a negative TEE for SBE . Creatinine was 3.1 on 08/15/14 and rose progressively. Renal biopsy done 08/18/14 showed changes of DM plus diffuse proliferative lesion c/w post-infectious GN. Despite treatment of her infection,  there has been no recovery of kidney function. Dialysis initiated 08/20/14.   Assessment/Recommendations:   AKI on CKD - new ESRD. 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 Recovery dependent on treatment of the underlying cause, but she has shown no improvement despite ATB treatment for her infection. Dialysis dependent since 08/20/14.  We have deemed her ESRD at this time. Her outpt schedule will be MWF at [facility]. EDW not yet established.  Has permanent access  (L AVG - Dr. Kellie Simmering 3/30) and TDC (3/15 IR). Next HD Monday. Counselled on fluid restriction.   Anemia - On Aranesp. Rec'd feraheme earlier in adm but TSat still low at 18 - dosing ferrelecit with HD for total of 5 doses -started  3/31.  Met bone ds - PTH 72. Phos in normal range No binder or VDRA needed at this time  MSSA bacteremia with septic polyarthritis - to complete 8 weeks of Ancef. Day 1 was 08/11/24. TEE was negative. Once ready for discharge will be able to receive Ancef at the outpt HD unit to complete the 8 week course..  Malnutrition - supplements. Renal vitamin.  Hep C Ab +  DM per primary  HTN good. Anticipate need for less medication once volume under better control. Reduced hydralazine and BB in that regard to allow for fluid removal with HD. Hold BP meds prior to HD.  Malnutrition - supplements  Rehab - to be completed as inpt   Jamal Maes, MD [facility] [phone] pager 09/16/2014, 12:46 PM

## 2014-09-16 NOTE — Progress Notes (Signed)
Occupational Therapy Session Note  Patient Details  Name: Brandy Houston MRN: [id number] Date of Birth: 20-Aug-1955  Today's Date: 09/16/2014 OT Individual Time: 1345-1445  (60 min)  OT Individual Time Calculation (min): 60 min    Short Term Goals: Week 1:  OT Short Term Goal 1 (Week 1): Pt. will bathe self with supervision OT Short Term Goal 2 (Week 1): Pt. will dress UB with supervision OT Short Term Goal 3 (Week 1): Pt will dress LB with minimal assist OT Short Term Goal 4 (Week 1): Pt. will go from sit to stand with min assist OT Short Term Goal 5 (Week 1): Pt. will transfer to toilet with minimal assist  Skilled Therapeutic Interventions/Progress Updates:   Skilled session focusing on activity tolerance and functional transfers in a home environment. Pt rec'd in room where therapist provided training on AE (long handled shoe horn) so pt could don running type shoes. Pt. Reports fatigue after vigorous PT session. In therapy apt, pt engaged in sit<>stand<>RW<>3n1 commode chair>wc transfer training requiring min/mod A. After patient provided specifications of home bathroom, therapist set up simulated environment and facilitated sit<>stand<>RW>wc>bed  transfer training requiring min/mod A. Assistance level improved to min A when therapist provided mod verbal cues to lean forward before standing.   Pt has roll in shower at home and plans to obtain shower chair. Recommend model with side rails due to lack of grab bars in pt home. Pt returned to room, all needs within reach.      Therapy Documentation Precautions:  Precautions Precautions: Fall Precaution Comments: Will sit without warning when fatigued; bring chair behind for safety Restrictions Weight Bearing Restrictions: Yes RLE Weight Bearing: Weight bearing as tolerated LLE Weight Bearing: Weight bearing as tolerated General:   Vital Signs: Therapy Vitals Temp: 99.6 F (37.6 C) Temp Source: Oral Pulse Rate: 96 Resp: 18 BP: (!)  150/74 mmHg Patient Position (if appropriate): Lying Oxygen Therapy SpO2: 99 % O2 Device: Not Delivered Pain: Pain Assessment Pain Assessment: 0-10 Pain Score: 6  Pain Type: Acute pain Pain Location: Generalized Pain Orientation: Right;Left Pain Onset: With Activity Pain Intervention(s): Repositioned;Rest   Therapy/Group: Individual Therapy  Heidi Bishop 09/16/2014, 3:36 PM

## 2014-09-16 NOTE — Progress Notes (Signed)
Brandy Houston is a 59 y.o. female [birth year] [id number]  Subjective: Feels nervous but no new complaints. "dont like to be in that prison bed" (alarms). No new problems. Denies pain or shortness of breath   Objective: Vital signs in last 24 hours: Temp:  [98.1 F (36.7 C)-98.9 F (37.2 C)] 98.4 F (36.9 C) (04/02 0610) Pulse Rate:  [82-100] 84 (04/02 0610) Resp:  [15-18] 17 (04/02 0610) BP: (141-173)/(66-90) 145/68 mmHg (04/02 0610) SpO2:  [97 %-99 %] 97 % (04/02 0610) Weight:  [89.2 kg (196 lb 10.4 oz)-93 kg (205 lb 0.4 oz)] 90.5 kg (199 lb 8.3 oz) (04/02 0610) Weight change: -0.895 kg (-1 lb 15.6 oz) Last BM Date: 09/12/14  Intake/Output from previous day: 04/01 0701 - 04/02 0700 In: 240 [P.O.:240] Out: 3750   Physical Exam General: No apparent distress   In mobile WC, happy to be out of bed Lungs: Normal effort. Lungs clear to auscultation, no crackles or wheezes. Cardiovascular: Regular rate and rhythm, no edema Neurological: No new neurological deficits   Lab Results: BMET    Component Value Date/Time   NA 132* 09/15/2014 1515   K 4.2 09/15/2014 1515   CL 99 09/15/2014 1515   CO2 27 09/15/2014 1515   GLUCOSE 173* 09/15/2014 1515   BUN 10 09/15/2014 1515   CREATININE 5.24* 09/15/2014 1515   CREATININE 1.15* 02/27/2014 1203   CALCIUM 8.3* 09/15/2014 1515   CALCIUM 8.1* 09/12/2014 0700   GFRNONAA 8* 09/15/2014 1515   GFRNONAA 52* 02/27/2014 1203   GFRAA 9* 09/15/2014 1515   GFRAA 60 02/27/2014 1203   CBC    Component Value Date/Time   WBC 12.7* 09/15/2014 1500   RBC 3.06* 09/15/2014 1500   RBC 3.77* 08/07/2014 2114   HGB 9.0* 09/15/2014 1500   HCT 28.8* 09/15/2014 1500   PLT 437* 09/15/2014 1500   MCV 94.1 09/15/2014 1500   MCH 29.4 09/15/2014 1500   MCHC 31.3 09/15/2014 1500   RDW 15.6* 09/15/2014 1500   LYMPHSABS 1.7 08/18/2014 0512   MONOABS 1.2* 08/18/2014 0512   EOSABS 0.1 08/18/2014 0512   BASOSABS 0.0 08/18/2014 0512   CBG's (last 3):    Recent Labs  09/15/14 1148 09/15/14 2104 09/16/14 0708  GLUCAP 142* 121* 108*   LFT's Lab Results  Component Value Date   ALT <5 08/28/2014   AST 12 08/28/2014   ALKPHOS 100 08/28/2014   BILITOT 0.7 08/28/2014    Studies/Results: No results found.  Medications:  I have reviewed the patient's current medications. Scheduled Medications: . antiseptic oral rinse  7 mL Mouth Rinse q12n4p  . aspirin  81 mg Oral Daily  .  ceFAZolin (ANCEF) IV  2 g Intravenous Q M,W,F-HD  . chlorhexidine  15 mL Mouth Rinse BID  . cyclobenzaprine  5 mg Oral BID  . [START ON 09/19/2014] darbepoetin (ARANESP) injection - DIALYSIS  200 mcg Intravenous Q Tue-HD  . enoxaparin (LOVENOX) injection  30 mg Subcutaneous Q24H  . famotidine  20 mg Oral Daily  . feeding supplement (NEPRO CARB STEADY)  237 mL Oral TID WC  . ferric gluconate (FERRLECIT/NULECIT) IV  125 mg Intravenous Q M,W,F-HD  . gabapentin  100 mg Oral BID  . hydrALAZINE  25 mg Oral BID  . insulin aspart  0-9 Units Subcutaneous TID WC  . lanthanum  1,000 mg Oral TID WC  . metoprolol tartrate  50 mg Oral BID  . multivitamin  1 tablet Oral QHS  . nicotine  21 mg Transdermal  Daily  . oxyCODONE  10 mg Oral BID WC  . polyethylene glycol  17 g Oral BID  . sodium chloride  10-40 mL Intracatheter Q12H   PRN Medications: acetaminophen, ALPRAZolam, bisacodyl, diphenhydrAMINE, gi cocktail, guaiFENesin-dextromethorphan, hydrocerin, magic mouthwash w/lidocaine, methocarbamol, ondansetron (ZOFRAN) IV, oxyCODONE, sodium chloride, sodium chloride, sorbitol, traZODone, zolpidem  Assessment/Plan: Principal Problem:   Septic joint of right knee joint Active Problems:   Diabetes mellitus, type 2   Septic joint of left shoulder region   Physical debility  1. 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 DVT Prophylaxis/Anticoagulation: Pharmaceutical: Lovenox 3. Pain Management: oxycodone 10  mg prn.  -anxiety mgt 4. Adjustment reaction/ Mood: Team to provide ego support. Patient not interested in antidepressant at this time. LCSW to follow for evaluation and support.  5. Neuropsych: This patient is capable of making decisions on her own behalf. 6. Skin/Wound Care: Routine pressure relief measures. Elevate LUE when in bed to help with edema control 7. Fluids/Electrolytes/Nutrition: Needs 1200 FR due to ESRD. Routine check of lytes with HD. 8. ESRD: will complete HD after any therapy sessions to help with tolerance and endurance.  9. Septic arthritis: continue Ancef thru 10/04/14. Wbc's down, afeb 10. DM type 2 with neuropathy: on gabapentin for significant neuropathic symptoms BLE. Continue to monitor BS ac/hs basis and use SSI for elevated BS. Off glipizide and currently diet controlled as po intake is poor. Will add nepro for supplement.  11. HTN: Monitor every 8 hours. Continue Hydralazine bid.   Length of stay, days: 2    Winnie Umali A. Asa Lente, MD 09/16/2014, 11:18 AM

## 2014-09-16 NOTE — Progress Notes (Signed)
Occupational Therapy Session Note  Patient Details  Name: Brandy Houston MRN: [id number] Date of Birth: 1955-08-29  Today's Date: 09/16/2014 OT Individual Time:  - [id number]  (60 min)      Short Term Goals: Week 1:  OT Short Term Goal 1 (Week 1): Pt. will bathe self with supervision OT Short Term Goal 2 (Week 1): Pt. will dress UB with supervision OT Short Term Goal 3 (Week 1): Pt will dress LB with minimal assist OT Short Term Goal 4 (Week 1): Pt. will go from sit to stand with min assist OT Short Term Goal 5 (Week 1): Pt. will transfer to toilet with minimal assist  Skilled Therapeutic Interventions/Progress Updates:    Pt. Sitting on EOB upon entering room.  Ppt ageed to shower.  Pt was provided set up for BADL.  Focus of treatment was transfers, sit to stand, BUE functional movement during BADL, standing balance and functional mobility.  Pt. Was provided set up for BADL. She transferred from wc to tub transfer bench with mod assist and assistance with positioning and correct body mechanics.  Went from sit to stand in shower for peri care.  Transferred out to wc.  Dressed with AE and minimal assist.  Pt. Left in room with all needs in reach.    Therapy Documentation Precautions:  Precautions Precautions: Fall Precaution Comments: Will sit without warning when fatigued; bring chair behind for safety Restrictions Weight Bearing Restrictions: Yes RLE Weight Bearing: Weight bearing as tolerated LLE Weight Bearing: Weight bearing as tolerated   Pain:  5/10  Both knees with more pain on the left;  Left shoulder 5/10   See FIM for current functional status  Therapy/Group: Individual Therapy  Lisa Roca 09/16/2014, 8:39 AM

## 2014-09-17 ENCOUNTER — Inpatient Hospital Stay (HOSPITAL_COMMUNITY): Payer: Self-pay

## 2014-09-17 LAB — GLUCOSE, CAPILLARY
Glucose-Capillary: 109 mg/dL — ABNORMAL HIGH (ref 70–99)
Glucose-Capillary: 149 mg/dL — ABNORMAL HIGH (ref 70–99)
Glucose-Capillary: 137 mg/dL — ABNORMAL HIGH (ref 70–99)
Glucose-Capillary: 110 mg/dL — ABNORMAL HIGH (ref 70–99)

## 2014-09-17 NOTE — Progress Notes (Signed)
Physical Therapy Session Note  Patient Details  Name: Brandy Houston MRN: [id number] Date of Birth: Aug 28, 1955  Today's Date: 09/17/2014 PT Individual Time: [id number] PT Individual Time Calculation (min): 30 min   Short Term Goals: Week 1:  PT Short Term Goal 1 (Week 1): Patient will perform bed mobility with supervision. PT Short Term Goal 2 (Week 1): Patient will consistently perform sit <> stand with min A. PT Short Term Goal 3 (Week 1): Patient will ambulate 100 ft using RW with supervision. PT Short Term Goal 4 (Week 1): Patient will negotiate up/down 4 stairs using 2 rails with min A.   Skilled Therapeutic Interventions/Progress Updates:    Pt received seated in recliner, agreeable to participate in therapy. Session focused on gait training, stairs, w/c propulsion. Pt w/ multiple sit<>stands during session initially [place] progressing to [place] after cueing to scoot hips towards front of chair. Pt ascended 4 stairs w/ 2 Rails and ModA for maintaining upright posture and facilitating knee extension and descended 4 stairs w/ 2 rails and MinA for safety. Ambulated 2x60' w/ RW and MinA, pt reported feeling her knees buckling but not noted by therapist. Session ended in pt's room, where pt was left seated in recliner w/ all needs within reach.    Therapy Documentation Precautions:  Precautions Precautions: Fall Precaution Comments: Will sit without warning when fatigued; bring chair behind for safety Restrictions Weight Bearing Restrictions: Yes RLE Weight Bearing: Weight bearing as tolerated LLE Weight Bearing: Weight bearing as tolerated Pain: Pain Assessment Pain Score: 3  Pain Type: Acute pain Pain Location: Arm (Left arm) Pain Orientation: Left Pain Onset: On-going Pain Intervention(s): Pt received medication from RN prior to session  See FIM for current functional status  Therapy/Group: Individual Therapy  Rada Hay  Rada Hay, PT, DPT 09/17/2014, 7:53 AM

## 2014-09-17 NOTE — Progress Notes (Signed)
Occupational Therapy Session Note  Patient Details  Name: Natira Lungren MRN: [id number] Date of Birth: 02-28-1956  Today's Date: 09/17/2014 OT Individual Time:  - 0900-1000  (60 min ( Missed minutes)       Short Term Goals: Week 1:  OT Short Term Goal 1 (Week 1): Pt. will bathe self with supervision OT Short Term Goal 2 (Week 1): Pt. will dress UB with supervision OT Short Term Goal 3 (Week 1): Pt will dress LB with minimal assist OT Short Term Goal 4 (Week 1): Pt. will go from sit to stand with min assist OT Short Term Goal 5 (Week 1): Pt. will transfer to toilet with minimal assist      Skilled Therapeutic Interventions/Progress Updates:    Skilled intervention for the following:  Engaged in therapeutic activities, sitting balance, sit to stand, standing balance.  Pt. Sat  EOB for 30 minutes with functional activites, reaching to floor and donning clothes with no LOB and SBA.  Pt. Went fro sit to stand with ^^^   Assist.  She utilized RW fo balance.  Pt donned right TED stocking with cues for breath control and rest breaks.  OT donned other TED.    Therapy Documentation Precautions:  Precautions Precautions: Fall Precaution Comments: Will sit without warning when fatigued; bring chair behind for safety Restrictions Weight Bearing Restrictions: Yes RLE Weight Bearing: Weight bearing as tolerated LLE Weight Bearing: Weight bearing as tolerated General:     Pain: Pain Assessment Pain Score:5/10  Left knee           See FIM for current functional status  Therapy/Group: Individual Therapy  Lisa Roca 09/17/2014, 9:31 AM

## 2014-09-17 NOTE — Progress Notes (Signed)
Brandy Houston is a 59 y.o. female [birth year] [id number]  Subjective: No new complaints or new problems. Denies pain or shortness of breath. Asks about LUE AVG - slightly swollen but stable since postop, not painful   Objective: Vital signs in last 24 hours: Temp:  [97.8 F (36.6 C)-99.6 F (37.6 C)] 97.8 F (36.6 C) (04/03 0627) Pulse Rate:  [87-96] 87 (04/03 0627) Resp:  [18] 18 (04/03 0627) BP: (122-150)/(57-74) 141/70 mmHg (04/03 0627) SpO2:  [99 %] 99 % (04/03 0627) Weight:  [93.033 kg (205 lb 1.6 oz)] 93.033 kg (205 lb 1.6 oz) (04/03 0500) Weight change: 0.033 kg (1.2 oz) Last BM Date: 09/12/14  Intake/Output from previous day: 04/02 0701 - 04/03 0700 In: 480 [P.O.:480] Out: -   Physical Exam General: No apparent distress   In bed, eating breakfast. Lungs: Normal effort. Lungs clear to auscultation, no crackles or wheezes. Cardiovascular: Regular rate and rhythm, no edema EXT: LUE with AVG -good thrill, slight diffuse soft tissue swelling of forearm without abn warmth, nontender Neurological: No new neurological deficits   Lab Results: BMET    Component Value Date/Time   NA 132* 09/15/2014 1515   K 4.2 09/15/2014 1515   CL 99 09/15/2014 1515   CO2 27 09/15/2014 1515   GLUCOSE 173* 09/15/2014 1515   BUN 10 09/15/2014 1515   CREATININE 5.24* 09/15/2014 1515   CREATININE 1.15* 02/27/2014 1203   CALCIUM 8.3* 09/15/2014 1515   CALCIUM 8.1* 09/12/2014 0700   GFRNONAA 8* 09/15/2014 1515   GFRNONAA 52* 02/27/2014 1203   GFRAA 9* 09/15/2014 1515   GFRAA 60 02/27/2014 1203   CBC    Component Value Date/Time   WBC 12.7* 09/15/2014 1500   RBC 3.06* 09/15/2014 1500   RBC 3.77* 08/07/2014 2114   HGB 9.0* 09/15/2014 1500   HCT 28.8* 09/15/2014 1500   PLT 437* 09/15/2014 1500   MCV 94.1 09/15/2014 1500   MCH 29.4 09/15/2014 1500   MCHC 31.3 09/15/2014 1500   RDW 15.6* 09/15/2014 1500   LYMPHSABS 1.7 08/18/2014 0512   MONOABS 1.2* 08/18/2014 0512   EOSABS 0.1  08/18/2014 0512   BASOSABS 0.0 08/18/2014 0512   CBG's (last 3):    Recent Labs  09/16/14 1641 09/16/14 2129 09/17/14 0707  GLUCAP 146* 126* 109*   LFT's Lab Results  Component Value Date   ALT <5 08/28/2014   AST 12 08/28/2014   ALKPHOS 100 08/28/2014   BILITOT 0.7 08/28/2014    Studies/Results: No results found.  Medications:  I have reviewed the patient's current medications. Scheduled Medications: . antiseptic oral rinse  7 mL Mouth Rinse q12n4p  . aspirin  81 mg Oral Daily  .  ceFAZolin (ANCEF) IV  2 g Intravenous Q M,W,F-HD  . chlorhexidine  15 mL Mouth Rinse BID  . cyclobenzaprine  5 mg Oral BID  . [START ON 09/19/2014] darbepoetin (ARANESP) injection - DIALYSIS  200 mcg Intravenous Q Tue-HD  . enoxaparin (LOVENOX) injection  30 mg Subcutaneous Q24H  . famotidine  20 mg Oral Daily  . feeding supplement (NEPRO CARB STEADY)  237 mL Oral TID WC  . ferric gluconate (FERRLECIT/NULECIT) IV  125 mg Intravenous Q M,W,F-HD  . gabapentin  100 mg Oral BID  . hydrALAZINE  25 mg Oral BID  . insulin aspart  0-9 Units Subcutaneous TID WC  . lanthanum  1,000 mg Oral TID WC  . metoprolol tartrate  50 mg Oral BID  . multivitamin  1 tablet Oral QHS  .  nicotine  21 mg Transdermal Daily  . oxyCODONE  10 mg Oral BID WC  . polyethylene glycol  17 g Oral BID  . sodium chloride  10-40 mL Intracatheter Q12H   PRN Medications: acetaminophen, ALPRAZolam, bisacodyl, diphenhydrAMINE, gi cocktail, guaiFENesin-dextromethorphan, hydrocerin, magic mouthwash w/lidocaine, methocarbamol, ondansetron (ZOFRAN) IV, oxyCODONE, sodium chloride, sodium chloride, sorbitol, traZODone, zolpidem  Assessment/Plan: Principal Problem:   Septic joint of right knee joint Active Problems:   Diabetes mellitus, type 2   Septic joint of left shoulder region   Physical debility  1. 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 DVT  Prophylaxis/Anticoagulation: Pharmaceutical: Lovenox 3. Pain Management: oxycodone 10 mg prn.  -anxiety mgt 4. Adjustment reaction/ Mood: Team to provide ego support. Patient not interested in antidepressant at this time. LCSW to follow for evaluation and support.  5. Neuropsych: This patient is capable of making decisions on her own behalf. 6. Skin/Wound Care: Routine pressure relief measures. Elevate LUE when in bed to help with edema control 7. Fluids/Electrolytes/Nutrition: Needs 1200 FR due to ESRD. Routine check of lytes with HD. 8. ESRD: will complete HD after any therapy sessions to help with tolerance and endurance. BP meds and HD schedule per renal  9. Septic arthritis: continue Ancef thru 10/04/14. Wbc's down, afeb 10. DM type 2 with neuropathy: on gabapentin for significant neuropathic symptoms BLE. Continue to monitor BS ac/hs basis and use SSI for elevated BS. Off glipizide and currently diet controlled as po intake is poor. taking nepro for supplement.  11. HTN: Monitor every 8 hours. Continue Hydralazine bid.   Length of stay, days: 3    Teegan Brandis A. Asa Lente, MD 09/17/2014, 9:31 AM

## 2014-09-17 NOTE — Progress Notes (Signed)
ANTIBIOTIC CONSULT NOTE - FOLLOW UP  Pharmacy Consult for Ancef Indication: disseminated MSSA infection  Allergies  Allergen Reactions  . Compazine [Prochlorperazine] Shortness Of Breath and Swelling    TONGUE SWELLS  . Omnipaque [Iohexol] Hives  . Shellfish-Derived Products Anaphylaxis  . Iodinated Diagnostic Agents Rash    Patient Measurements: Height: 5\' 7"  (170.2 cm) Weight: 205 lb 1.6 oz (93.033 kg) IBW/kg (Calculated) : 61.6  Vital Signs: Temp: 97.8 F (36.6 C) (04/03 0627) Temp Source: Oral (04/03 0627) BP: 141/70 mmHg (04/03 0627) Pulse Rate: 87 (04/03 0627) Intake/Output from previous day: 04/02 0701 - 04/03 0700 In: 480 [P.O.:480] Out: -  Intake/Output from this shift:    Labs:  Recent Labs  09/15/14 1500 09/15/14 1515  WBC 12.7*  --   HGB 9.0*  --   PLT 437*  --   CREATININE  --  5.24*   Estimated Creatinine Clearance: 13.5 mL/min (by C-G formula based on Cr of 5.24). No results for input(s): VANCOTROUGH, VANCOPEAK, VANCORANDOM, GENTTROUGH, GENTPEAK, GENTRANDOM, TOBRATROUGH, TOBRAPEAK, TOBRARND, AMIKACINPEAK, AMIKACINTROU, AMIKACIN in the last 72 hours.   Microbiology: Recent Results (from the past 720 hour(s))  Body fluid culture     Status: None   Collection Time: 08/18/14  4:52 PM  Result Value Ref Range Status   Specimen Description FLUID SYNOVIAL LEFT KNEE  Final   Special Requests Normal  Final   Gram Stain   Final    MODERATE WBC PRESENT,BOTH PMN AND MONONUCLEAR NO ORGANISMS SEEN Performed at [facility]    Culture   Final    NO GROWTH 3 DAYS Performed at [facility]    Report Status 08/22/2014 FINAL  Final  Culture, Urine     Status: None   Collection Time: 09/02/14  3:04 PM  Result Value Ref Range Status   Specimen Description URINE, CATHETERIZED  Final   Special Requests NONE  Final   Colony Count   Final    >=100,000 COLONIES/ML Performed at [facility]    Culture   Final    PSEUDOMONAS  AERUGINOSA Performed at [facility]    Report Status 09/05/2014 FINAL  Final   Organism ID, Bacteria PSEUDOMONAS AERUGINOSA  Final      Susceptibility   Pseudomonas aeruginosa - MIC*    CEFEPIME 2 SENSITIVE Sensitive     CEFTAZIDIME 4 SENSITIVE Sensitive     CIPROFLOXACIN 0.5 SENSITIVE Sensitive     GENTAMICIN <=1 SENSITIVE Sensitive     IMIPENEM >=16 RESISTANT Resistant     PIP/TAZO 8 SENSITIVE Sensitive     TOBRAMYCIN <=1 SENSITIVE Sensitive     * PSEUDOMONAS AERUGINOSA  Surgical pcr screen     Status: None   Collection Time: 09/13/14  1:12 AM  Result Value Ref Range Status   MRSA, PCR NEGATIVE NEGATIVE Final   Staphylococcus aureus NEGATIVE NEGATIVE Final    Comment:        The Xpert SA Assay (FDA approved for NASAL specimens in patients over 60 years of age), is one component of a comprehensive surveillance program.  Test performance has been validated by [facility] for patients greater than or equal to 60 year old. It is not intended to diagnose infection nor to guide or monitor treatment.     Anti-infectives    Start     Dose/Rate Route Frequency Ordered Stop   09/16/14 1200  ceFAZolin (ANCEF) IVPB 2 g/50 mL premix  Status:  Discontinued     2 g  100 mL/hr over 30 Minutes Intravenous Every T-Th-Sa (Hemodialysis) 09/15/14 0942 09/15/14 1003   09/15/14 1800  ceFAZolin (ANCEF) IVPB 2 g/50 mL premix  Status:  Discontinued     2 g 100 mL/hr over 30 Minutes Intravenous Every M-W-F (Hemodialysis) 09/14/14 2044 09/15/14 0942   09/15/14 1200  ceFAZolin (ANCEF) IVPB 2 g/50 mL premix     2 g 100 mL/hr over 30 Minutes Intravenous Every M-W-F (Hemodialysis) 09/15/14 1003        Assessment: 17 yoF admitted with intractable shoulder pain, 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 Patient continues on Ancef, planning for 8 weeks of therapy (end date 4/21). Patient with ARF, started on HD this admission.  Now deemed ESRD, s/p AVG creation on 3/30 for long-term HD  scheduled MWF. Ancef being given M/W/F with HD. Tm 99.6, WBC up to 12.7 on 4/1.  Goal of Therapy:  Eradication of infection  Plan:  -Ancef 2g IV qMWF. -Monitor clinical progress  Sherlon Handing, PharmD, BCPS Clinical pharmacist, pager [phone] 09/17/2014,10:57 AM

## 2014-09-18 ENCOUNTER — Inpatient Hospital Stay (HOSPITAL_COMMUNITY): Payer: Medicaid Other | Admitting: Physical Therapy

## 2014-09-18 ENCOUNTER — Inpatient Hospital Stay (HOSPITAL_COMMUNITY): Payer: Self-pay

## 2014-09-18 LAB — GLUCOSE, CAPILLARY
Glucose-Capillary: 129 mg/dL — ABNORMAL HIGH (ref 70–99)
Glucose-Capillary: 134 mg/dL — ABNORMAL HIGH (ref 70–99)
Glucose-Capillary: 100 mg/dL — ABNORMAL HIGH (ref 70–99)
Glucose-Capillary: 128 mg/dL — ABNORMAL HIGH (ref 70–99)

## 2014-09-18 LAB — RENAL FUNCTION PANEL
Albumin: 1.5 g/dL — ABNORMAL LOW (ref 3.5–5.2)
Anion gap: 6 (ref 5–15)
BUN: 14 mg/dL (ref 6–23)
CO2: 29 mmol/L (ref 19–32)
Calcium: 8 mg/dL — ABNORMAL LOW (ref 8.4–10.5)
Chloride: 99 mmol/L (ref 96–112)
Creatinine, Ser: 4.97 mg/dL — ABNORMAL HIGH (ref 0.50–1.10)
GFR calc Af Amer: 10 mL/min — ABNORMAL LOW (ref >90)
GFR calc non Af Amer: 9 mL/min — ABNORMAL LOW (ref >90)
Glucose, Bld: 120 mg/dL — ABNORMAL HIGH (ref 70–99)
Phosphorus: 2.6 mg/dL (ref 2.3–4.6)
Potassium: 3.7 mmol/L (ref 3.5–5.1)
Sodium: 134 mmol/L — ABNORMAL LOW (ref 135–145)

## 2014-09-18 MED ORDER — OXYCODONE HCL 5 MG PO TABS
ORAL_TABLET | ORAL | Status: AC
  Filled 2014-09-18: qty 2

## 2014-09-18 NOTE — Progress Notes (Signed)
[place] PHYSICAL MEDICINE & REHABILITATION     PROGRESS NOTE    Subjective/Complaints: Left arm still sore. Did well with therapies  Objective: Vital Signs: Blood pressure 120/61, pulse 83, temperature 98.8 F (37.1 C), temperature source Oral, resp. rate 18, height 5\' 7"  (1.702 m), weight 93.033 kg (205 lb 1.6 oz), last menstrual period 03/16/2002, SpO2 96 %. No results found.  Recent Labs  09/15/14 1500  WBC 12.7*  HGB 9.0*  HCT 28.8*  PLT 437*    Recent Labs  09/15/14 1515  NA 132*  K 4.2  CL 99  GLUCOSE 173*  BUN 10  CREATININE 5.24*  CALCIUM 8.3*   CBG (last 3)   Recent Labs  09/17/14 1632 09/17/14 2118 09/18/14 0703  GLUCAP 137* 110* 129*    Wt Readings from Last 3 Encounters:  09/17/14 93.033 kg (205 lb 1.6 oz)  09/14/14 92.2 kg (203 lb 4.2 oz)  08/08/14 89.812 kg (198 lb)    Physical Exam:  Constitutional: She is oriented to person, place, and time. She appears well-developed and well-nourished.  HENT:  Head: Normocephalic and atraumatic.  Eyes: Conjunctivae are normal. Pupils are equal, round, and reactive to light.  Neck: Normal range of motion. Neck supple.  Cardiovascular: Normal rate, regular rhythm and normal heart sounds.  Respiratory: Effort normal and breath sounds normal.  GI: Soft. Bowel sounds are normal.  Musculoskeletal: She exhibits edema (1+ edema L-forearm with less tenderness).  Healing incisions bilateral knees with edema L>R. Left knee with pain on ROM. Moves RUE without difficulty. LUE still tender along AVG site. Mild edema and warmth noted  Neurological: She is alert and oriented to person, place, and time.  Skin: Skin is warm and dry.  Psychiatric: Her speech is normal. Judgment and thought content normal. Her mood appears pleasant, slightly anxious.     Assessment/Plan: 1. Functional deficits secondary to septic joints with subsequent debilitation which require 3+ hours per day of interdisciplinary therapy  in a comprehensive inpatient rehab setting. Physiatrist is providing close team supervision and 24 hour management of active medical problems listed below. Physiatrist and rehab team continue to assess barriers to discharge/monitor patient progress toward functional and medical goals. FIM: FIM - Bathing Bathing Steps Patient Completed: Chest, Right Arm, Left Arm, Abdomen, Front perineal area, Right upper leg, Left upper leg, Right lower leg (including foot), Left lower leg (including foot) Bathing: 3: Mod-Patient completes 5-7 24f 10 parts or 50-74%  FIM - Upper Body Dressing/Undressing Upper body dressing/undressing steps patient completed: Thread/unthread right bra strap, Thread/unthread left bra strap, Hook/unhook bra, Thread/unthread left sleeve of pullover shirt/dress, Thread/unthread right sleeve of pullover shirt/dresss, Put head through opening of pull over shirt/dress, Pull shirt over trunk Upper body dressing/undressing: 4: Min-Patient completed 75 plus % of tasks FIM - Lower Body Dressing/Undressing Lower body dressing/undressing steps patient completed: Don/Doff right shoe, Don/Doff left shoe, Fasten/unfasten left shoe, Fasten/unfasten right shoe Lower body dressing/undressing: 4: Min-Patient completed 75 plus % of tasks  FIM - Toileting Toileting: 0: Activity did not occur  FIM - Air cabin crew Transfers: 1-Two helpers  FIM - Control and instrumentation engineer Devices: Environmental consultant, Arm rests Bed/Chair Transfer: 3: Bed > Chair or W/C: Mod A (lift or lower assist), 4: Chair or W/C > Bed: Min A (steadying Pt. > 75%)  FIM - Locomotion: Wheelchair Distance: 75 Locomotion: Wheelchair: 2: Travels 50 - 149 ft with supervision, cueing or coaxing FIM - Locomotion: Ambulation Locomotion: Ambulation Assistive Devices: Administrator  Ambulation/Gait Assistance: 4: Min assist Locomotion: Ambulation: 2: Travels 64 - 149 ft with minimal assistance  (Pt.>75%)  Comprehension Comprehension Mode: Auditory Comprehension: 5-Understands complex 90% of the time/Cues < 10% of the time  Expression Expression Mode: Verbal Expression: 6-Expresses complex ideas: With extra time/assistive device  Social Interaction Social Interaction: 6-Interacts appropriately with others with medication or extra time (anti-anxiety, antidepressant).  Problem Solving Problem Solving Mode: Asleep Problem Solving: 5-Solves complex 90% of the time/cues < 10% of the time  Memory Memory: 7-Complete Independence: No helper  Medical Problem List and Plan: 1. 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 DVT Prophylaxis/Anticoagulation: Pharmaceutical: Lovenox 3. Pain Management:   oxycodone 10 mg prn.   -anxiety mgt 4. Adjustment reaction/ Mood: Team to provide ego support. Patient not interested in antidepressant at this time. LCSW to follow for evaluation and support.  5. Neuropsych: This patient is capable of making decisions on her own behalf. 6. Skin/Wound Care: Routine pressure relief measures. Elevate LUE when in bed to help with edema control 7. Fluids/Electrolytes/Nutrition: Needs 1200 FR due to ESRD. Routine check of lytes with HD. 8. ESRD: Schedule HD past therapy sessions to help with tolerance and endurance. on 9. Septic arthritis:  continue Ancef  thru 10/04/14.   10. DM type 2 with neuropathy: Resume gabapentin as complaining of significant neuropathic symptoms BLE. Continue to monitor BS ac/hs basis and use SSI for elevated BS. Off glipizide and currently diet controlled as po intake is poor. Will add nepro for supplement.  11. HTN: Monitor every 8 hours. Continue Hydralazine bid.   LOS (Days) 4 A FACE TO FACE EVALUATION WAS PERFORMED  Jessi Pitstick T 09/18/2014 8:03 AM

## 2014-09-18 NOTE — Progress Notes (Signed)
Social Work  Social Work Assessment and Plan  Patient Details  Name: Brandy Houston MRN: [id number] Date of Birth: 1955/10/29  Today's Date: 09/18/2014  Problem List:  Patient Active Problem List   Diagnosis Date Noted  . Physical debility 09/14/2014  . Acute renal failure syndrome   . Hep C w/ coma, chronic   . Joint infection   . Acute renal failure   . Hepatitis C 08/16/2014  . SIRS (systemic inflammatory response syndrome)   . Benign essential HTN   . Septic joint of right hand   . Staphylococcus aureus bacteremia   . Septic joint of right knee joint   . ESRD (end stage renal disease) on dialysis   . Tachycardia 08/07/2014  . Septic joint of left shoulder region 08/07/2014  . Sinus tachycardia 08/07/2014  . BP (high blood pressure) 06/24/2013  . Disorder of peripheral nervous system 06/24/2013  . 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 Diabetes mellitus, type 2 06/24/2013  . Uncontrolled hypertension 02/18/2013  . Type II or unspecified type diabetes mellitus without mention of complication, uncontrolled 02/18/2013  . Chest pain at rest 10/18/2011    Class: Acute  . Shortness of breath 10/18/2011    Class: Acute   Past Medical History:  Past Medical History  Diagnosis Date  . Diabetes mellitus   . Hypertension   . Peripheral neuropathy    Past Surgical History:  Past Surgical History  Procedure Laterality Date  . Cholecystectomy    . Appendectomy    . Small intestine surgery      Due to Small Bowel Obstruction  . Tubal ligation    . Knee arthroscopy Right 08/10/2014    Procedure: ARTHROSCOPY I & D KNEE;  Surgeon: Marianna Payment, MD;  Location: [facility];  Service: Orthopedics;  Laterality: Right;  . Shoulder arthroscopy Bilateral 08/10/2014    Procedure: I & D BILATERAL SHOULDERS ;  Surgeon: Marianna Payment, MD;  Location: [facility];  Service: Orthopedics;  Laterality: Bilateral;  . Tenosynovectomy Right 08/11/2014    Procedure: RIGHT WRIST IRRIGATION AND  DEBRIDEMENT, TENOSYNOVECTOMY;  Surgeon: Marianna Payment, MD;  Location: [facility];  Service: Orthopedics;  Laterality: Right;  . Knee arthroscopy Left 08/11/2014    Procedure: ARTHROSCOPIC WASHOUT LEFT KNEE;  Surgeon: Marianna Payment, MD;  Location: [facility];  Service: Orthopedics;  Laterality: Left;  . Tee without cardioversion N/A 08/14/2014    Procedure: TRANSESOPHAGEAL ECHOCARDIOGRAM (TEE);  Surgeon: Thayer Headings, MD;  Location: [place];  Service: Cardiovascular;  Laterality: N/A;  . Knee arthroscopy Left 08/19/2014    Procedure: ARTHROSCOPIC WASHOUT LEFT KNEE;  Surgeon: Leandrew Koyanagi, MD;  Location: [place];  Service: Orthopedics;  Laterality: Left;  . Av fistula placement Left 09/13/2014    Procedure: Brachial Artery to Brachial Vein Gortex Four - Seven Stretch GRAFT INSERTION Left Forearm;  Surgeon: Mal Misty, MD;  Location: [place];  Service: Vascular;  Laterality: Left;   Social History:  reports that she has been smoking.  She has never used smokeless tobacco. She reports that she drinks alcohol. She reports that she does not use illicit drugs.  Family / Support Systems Marital Status: Single Patient Roles: Parent, Other (Comment) (employee;  was working p/t) Children: also has a daughter,[name] Boggio Oceanographer) and a son, Roderic [facility]) Anticipated Caregiver: self and son Ability/Limitations of Caregiver: Son goes to school mornings and then comes home 10 am or so. Caregiver Availability: Intermittent Family Dynamics: pt describes good relationship  with son and his girlfriend;  Denies any concerns about support upon d/c.  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  Already has a SSD app pending. Legal Hisotry/Current Legal Issues: None Guardian/Conservator: None - per MD, pt  capable of making decisions on her own behalf   Abuse/Neglect Physical Abuse: Denies Verbal Abuse: Denies Sexual Abuse: Denies Exploitation of patient/patient's resources: Denies Self-Neglect: Denies  Emotional Status Pt's affect, behavior adn adjustment status: Pt pleasant, talkative and denies any s/s of emotional distress.  Does admit concerns about having to begin HD and how that might affect her daily life.  She speaks easily with this SW and with family in the room. Will monitor emotional adjustment as she progresses through program. Recent Psychosocial Issues: None Pyschiatric History: None Substance Abuse History: None  Patient / Family Perceptions, Expectations & Goals Pt/Family understanding of illness & functional limitations: Pt and family with basic understanding that "I got an infection in my joints...my knees....my kidneys have shut down..."  Basic understanding of her current functional limitations/ need for CIR. Premorbid pt/family roles/activities: Pt reports she was still independent but becoming more limited in her abilities at work.  "I thought the pains in my body were because I had overdone it at work..." Anticipated changes in roles/activities/participation: Little change anticipated as pt has mod i goals set.  Family may need to provide some intermittent support for home management, meal prep, etc. Pt/family expectations/goals: "I just hope I can do stuff for myself."  [employer]: Other (Comment) ([place] program) Premorbid Home Care/DME Agencies: None Transportation available at discharge: yes - but has also started a Research scientist (life sciences) Living Arrangements: [place]: Children, Other relatives Type of Residence: Private residence Insurance Resources: Teacher, adult education Resources: Family Support Financial Screen Referred: Previously completed (MA and SSD apps  pending) Living Expenses: Rent Money Management: Patient Does the patient have any problems obtaining your medications?: No (gets meds via [facility]) Home Management: pt and family Patient/Family Preliminary Plans: Pt plans to return home where she occupies the first floor and son/ gf living on 2nd floor Social Work Anticipated Follow Up Needs: HH/OP Expected length of stay: 10-14 days  Clinical Impression Very pleasant woman her following treatment for septic arthritis and new HD patient.  Good family support and denies any concerns about help at home.  Denies any s/s of emotional distress, however, admits she is concerned about "just living" with HD and how that will affect her daily life.  Will follow for support and d/c planning.  Kyana Aicher 09/17/2014, 10:35 AM

## 2014-09-18 NOTE — Progress Notes (Signed)
Occupational Therapy Note  Patient Details  Name: Brandy Houston MRN: [id number] Date of Birth: 1955/07/16  Today's Date: 09/18/2014 OT Individual Time: 1300-1400 OT Individual Time Calculation (min): 60 min   Pt c/o 6/10 pain in left knee; RN aware, pt stated she was premedicated prior to therapy Individual Therapy  Pt engaged in dynamic standing tasks.  Pt initially attempted sit<>stand from bed in ADL apartment, requiring max A for sit<>stand.  Pt amb with RW from bed to w/c (approx 5') and completed tasks with sit<>stand from w/c, requiring mod A.  Pt tasked with folding 5 towels in standing.  Pt able to fold 2 towels each occurrence of standing.  Pt required min A for standing balance.  Pt transitioned to day room and engaged in dynamic standing tasks while watering plants.  Pt propelled to room and remained in w/c with needs within reach.  Focus on activity tolerance, sit<>stand, standing balance, functional transfers, functional amb with RW, and safety awareness.   [name] [facility] 09/18/2014, 2:36 PM

## 2014-09-18 NOTE — Progress Notes (Signed)
Physical Therapy Session Note  Patient Details  Name: Brandy Houston MRN: [id number] Date of Birth: [birth year]  Today's Date: 09/18/2014 PT Individual Time: 1100-1200 PT Individual Time Calculation (min): 60 min   Short Term Goals: Week 1:  PT Short Term Goal 1 (Week 1): Patient will perform bed mobility with supervision. PT Short Term Goal 2 (Week 1): Patient will consistently perform sit <> stand with min A. PT Short Term Goal 3 (Week 1): Patient will ambulate 100 ft using RW with supervision. PT Short Term Goal 4 (Week 1): Patient will negotiate up/down 4 stairs using 2 rails with min A.   Skilled Therapeutic Interventions/Progress Updates:    Pt received seated in w/c, agreeable to participate in therapy after finishing dressing and hygiene at sink. Pt asked therapist to return in 15 minutes. Upon return therapist assisted pt with sit<>stand w/ MinA and pt completed LB dressing as documented in FIM. Remainder of session focused on functional endurance, ambulation, LE strength/ROM. Pt propelled w/c 100' to/from rehab gym w/ BUE and overall SBA w/ min cueing for technique with turns. Pt ambulated 25' w/ RW and MinA w/ 1 episode of knee buckling but pt self corrected and did not require additional assist. Instructed pt in seated and supine therex for B knee strength/ROM 2x10 each of SAQ, heel slides, LAQ. Instructed pt in use of Nustep L1 for gentle patient-led ROM of bilateral knees. Session ended in pt's room, where pt was left seated in w/c w/ all needs within reach.    Therapy Documentation Precautions:  Precautions Precautions: Fall Precaution Comments: Will sit without warning when fatigued; bring chair behind for safety Restrictions Weight Bearing Restrictions: Yes RLE Weight Bearing: Weight bearing as tolerated LLE Weight Bearing: Weight bearing as tolerated Pain: Pain Assessment Pain Assessment: 0-10 Pain Score: 5  Pain Type: Acute pain Pain Location: Generalized Pain  Orientation: Right;Left Pain Descriptors / Indicators: Aching;Tightness Pain Onset: On-going Pain Intervention(s): Ambulation/increased activity;Repositioned;Emotional support  See FIM for current functional status  Therapy/Group: Individual Therapy  Rada Hay  Rada Hay, PT, DPT 09/18/2014, 7:42 AM

## 2014-09-18 NOTE — Care Management Note (Deleted)
[facility] Individual Statement of Services  Patient Name:  Brandy Houston  Date:  09/17/2014  Welcome to the [facility].  Our goal is to provide you with an individualized program based on your diagnosis and situation, designed to meet your specific needs.  With this comprehensive rehabilitation program, you will be expected to participate in at least 3 hours of rehabilitation therapies Monday-Friday, with modified therapy programming on the weekends.  Your rehabilitation program will include the following services:  Physical Therapy (PT), Occupational Therapy (OT), 24 hour per day rehabilitation nursing, Therapeutic Recreaction (TR), Neuropsychology, Case Management (Social Worker), Rehabilitation Medicine, Nutrition Services and Pharmacy Services  Weekly team conferences will be held on Tuesdays to discuss your progress.  Your Social Worker will talk with you frequently to get your input and to update you on team discussions.  Team conferences with you and your family in attendance may also be held.  Expected length of stay: 10-14 days  Overall anticipated outcome: modified independent  Depending on your progress and recovery, your program may change. Your Social Worker will coordinate services and will keep you informed of any changes. Your Social Worker's name and contact numbers are listed  below.  The following services may also be recommended but are not provided by the [facility] will be made to provide these services after discharge if needed.  Arrangements include referral to agencies that provide these services.  Your insurance has been verified to be:  Medicaid application pending Your primary doctor is:  Chari Manning with [facility]  Pertinent information will  be shared with your doctor and your insurance company.  Social Worker:  [place], [place] or ([phone]   Information discussed with and copy given to patient by: Lennart Pall, 09/17/2014, 10:37 AM

## 2014-09-18 NOTE — Progress Notes (Signed)
Physical Therapy Session Note  Patient Details  Name: Brandy Houston MRN: [id number] Date of Birth: 23-Aug-1955  Today's Date: 09/18/2014 PT Individual Time: 1100-1200 PT Individual Time Calculation (min): 60 min   Short Term Goals: Week 1:  PT Short Term Goal 1 (Week 1): Patient will perform bed mobility with supervision. PT Short Term Goal 2 (Week 1): Patient will consistently perform sit <> stand with min A. PT Short Term Goal 3 (Week 1): Patient will ambulate 100 ft using RW with supervision. PT Short Term Goal 4 (Week 1): Patient will negotiate up/down 4 stairs using 2 rails with min A.   Skilled Therapeutic Interventions/Progress Updates:   Patient received asleep in wheelchair, easily aroused. Session focused on sit <> stand transfers, ambulation, BLE strengthening, and overall activity tolerance. Patient propelled wheelchair using BUE and intermittent use of RLE 2 x 100 ft with supervision. Patient required max verbal cues to safely set up wheelchair for transfer as she initially set up wheelchair directly facing mat and did not remove leg rests. Patient performed stand pivot transfer wheelchair > mat with min A. Patient required initial cues for safe hand placement with sit <> stand from mat using RW. Therex using RW for UE support in standing x 10-15 each exercise: seated LAQ, standing alternating hip flexion, standing heel raises. Patient performed forward reaching from slightly raised mat to facilitate squat position to place objects to target, min faded to mod A. Gait training using RW x 30 ft with mod A overall, patient demonstrated repetitive LLE buckling but still able to maintain weight. Patient required total A to turn and safely sit on mat after ambulation. Patient transferred back to wheelchair with min A and left sitting in wheelchair with all needs within reach.   Therapy Documentation Precautions:  Precautions Precautions: Fall Precaution Comments: Will sit without warning when  fatigued; bring chair behind for safety Restrictions Weight Bearing Restrictions: Yes RLE Weight Bearing: Weight bearing as tolerated LLE Weight Bearing: Weight bearing as tolerated Pain: Pain Assessment Pain Assessment: 0-10 Pain Score: 5  Pain Type: Acute pain Pain Location: Generalized Pain Orientation: Right;Left Pain Descriptors / Indicators: Aching;Tightness Pain Onset: On-going Pain Intervention(s): Ambulation/increased activity;Repositioned;Emotional support  See FIM for current functional status  Therapy/Group: Individual Therapy  Laretta Alstrom 09/18/2014, 12:12 PM

## 2014-09-18 NOTE — Progress Notes (Signed)
Recreational Therapy Assessment and Plan  Patient Details  Name: Brandy Houston MRN: [id number] Date of Birth: [birth year] Today's Date: 09/18/2014  Rehab Potential: Good ELOS: 10 dyas   Assessment  Problem List:  Patient Active Problem List   Diagnosis Date Noted  . Physical debility 09/14/2014  . Acute renal failure syndrome   . Hep C w/ coma, chronic   . Joint infection   . Acute renal failure   . Hepatitis C 08/16/2014  . SIRS (systemic inflammatory response syndrome)   . Benign essential HTN   . Septic joint of right hand   . Staphylococcus aureus bacteremia   . Septic joint of right knee joint   . ESRD (end stage renal disease) on dialysis   . Tachycardia 08/07/2014  . Septic joint of left shoulder region 08/07/2014  . Sinus tachycardia 08/07/2014  . BP (high blood pressure) 06/24/2013  . Disorder of peripheral nervous system 06/24/2013  . 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 Diabetes mellitus, type 2 06/24/2013  . Uncontrolled hypertension 02/18/2013  . Type II or unspecified type diabetes mellitus without mention of complication, uncontrolled 02/18/2013  . Chest pain at rest 10/18/2011    Class: Acute  . Shortness of breath 10/18/2011    Class: Acute    Past Medical History:  Past Medical History  Diagnosis Date  . Diabetes mellitus   . Hypertension   . Peripheral neuropathy    Past Surgical History:  Past Surgical History  Procedure Laterality Date  . Cholecystectomy    . Appendectomy    . Small intestine surgery      Due to Small Bowel Obstruction  . Tubal ligation    . Knee arthroscopy Right 08/10/2014    Procedure: ARTHROSCOPY I & D KNEE; Surgeon: Brandy Payment, MD; Location: [facility]; Service: Orthopedics; Laterality: Right;  . Shoulder arthroscopy Bilateral 08/10/2014    Procedure: I & D BILATERAL SHOULDERS  ; Surgeon: Brandy Payment, MD; Location: [facility]; Service: Orthopedics; Laterality: Bilateral;  . Tenosynovectomy Right 08/11/2014    Procedure: RIGHT WRIST IRRIGATION AND DEBRIDEMENT, TENOSYNOVECTOMY; Surgeon: Brandy Payment, MD; Location: [place]; Service: Orthopedics; Laterality: Right;  . Knee arthroscopy Left 08/11/2014    Procedure: ARTHROSCOPIC WASHOUT LEFT KNEE; Surgeon: Brandy Payment, MD; Location: [place]; Service: Orthopedics; Laterality: Left;  . Tee without cardioversion N/A 08/14/2014    Procedure: TRANSESOPHAGEAL ECHOCARDIOGRAM (TEE); Surgeon: Brandy Headings, MD; Location: [place]; Service: Cardiovascular; Laterality: N/A;  . Knee arthroscopy Left 08/19/2014    Procedure: ARTHROSCOPIC WASHOUT LEFT KNEE; Surgeon: Brandy Koyanagi, MD; Location: [place]; Service: Orthopedics; Laterality: Left;  . Av fistula placement Left 09/13/2014    Procedure: Brachial Artery to Brachial Vein Gortex Four - Seven Stretch GRAFT INSERTION Left Forearm; Surgeon: Brandy Misty, MD; Location: [place]; Service: Vascular; Laterality: Left;    Assessment & Plan Clinical Impression: [name] is a 59yo AAF with PMH sig for poorly controlled DM, HTN, CKD (baseline Scr 1.3-1.6) and new diagnosis of Hep C infection who was admitted to [facility] 08/07/14 with left shoulder pain. She had an elevated D-Dimer and underwent CT-angio to r/o PE on 08/07/14 and was admitted for further evaluation. work up revealed MSSA bacteremia with septic arthritis of bilateral AC joints, bilateral knees and right hand flexor tendon sheath. She underwent I and D bilateral knees and I and D with drainage of abscess of bilateral shoulder by Dr. Erlinda Houston on 08/10/14. She has required repeat I and D left  knee on 02/26 and 03/05. Dr. Kathrynn Houston consulted for right hand and forearm infection and patient underwent I and D of right flexor tendon sheath and right volar forearm on 02/27.  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 UOP remains poor and kidneys without recovery despite treatment of infection and patient has been dialysis dependent since 08/20/14.   TEE negative for endocarditis. Antibiotics narrowed to Ancef and patient to continue for 8 weeks duration using 2/26 as day 1/56 per Brandy Houston. She will need treatment for HCV past completing her antibiotic regimen for MSSA infection. She had febrile episode on 03/19 due to pseudomonas UTI and was treated with cipro X 5 days. Anemia of chronic disease treated with iron load as well as aranesp with recommendations for transfusion prn Hgb < 7.0. AVG placed by Dr. Kellie Houston on 03/30 and patient has had significant pain LUE with hypersensitivity affecting mobility. Po intake remains poor with decreased energy levels. Therapy ongoing and CIR recommended due to deconditioned state. Patient transferred to CIR on 09/14/2014.      Pt presents with decreased activity tolerance, decreased functional mobility, decreased balance, decreased coordination Limiting pt's independence with leisure/community pursuits.   Leisure History/Participation Premorbid leisure interest/current participation: Games - Board games;Games - Word-search;Games - Cards;Petra Kuba - Flower gardening;Community - Psychologist, forensic (dominoes, fill in work puzzles, works in Medical sales representative at [employer]) Other Leisure Interests: [employer] Leisure Participation Style: With Family/Friends Awareness of Community Resources: Excellent Psychosocial / Spiritual Social interaction - Mood/Behavior: Cooperative Academic librarian Appropriate for Education?: Yes Patient Agreeable to Outing?: Yes Recreational Therapy Orientation Orientation -Reviewed with patient: Available activity  resources Strengths/Weaknesses Patient Strengths/Abilities: Willingness to participate;Active premorbidly Patient weaknesses: Physical limitations TR Patient demonstrates impairments in the following area(s): Edema;Endurance;Motor;Safety;Sensory;Skin Integrity TR Additional Impairment(s): None  Plan Rec Therapy Plan Is patient appropriate for Therapeutic Recreation?: Yes Rehab Potential: Good Treatment times per week: Min 1 time per week >20 minutes Estimated Length of Stay: 10 dyas TR Treatment/Interventions: Adaptive equipment instruction;1:1 session;Balance/vestibular training;Functional mobility training;Community reintegration;Patient/family education;Therapeutic activities;Recreation/leisure participation;Therapeutic exercise;UE/LE Coordination activities;Wheelchair propulsion/positioning  Recommendations for other services: None  Discharge Criteria: Patient will be discharged from TR if patient refuses treatment 3 consecutive times without medical reason.  If treatment goals not met, if there is a change in medical status, if patient makes no progress towards goals or if patient is discharged from hospital.  The above assessment, treatment plan, treatment alternatives and goals were discussed and mutually agreed upon: by patient  [place] 09/18/2014, 10:15 AM

## 2014-09-18 NOTE — Progress Notes (Signed)
  [facility] Progress Note   Subjective: no complaints  Filed Vitals:   09/18/14 1421 09/18/14 1427 09/18/14 1430 09/18/14 1500  BP: 147/84 144/78 143/80 145/71  Pulse: 97 93 91 91  Temp: 98.3 F (36.8 C)     TempSrc: Oral     Resp: 18     Height:      Weight: 94.9 kg (209 lb 3.5 oz)     SpO2: 97%      Exam: Alert, no distress, on HD No jvd Chest clear bilat RRR no MRG Abd soft, NTND, no ascites, mod obese 2-3+ bilat pitting edema of upper and lower legs Neuro is alert, Ox 3  HD: new start Max inpatient weight 112kg, mim inpt weight 90.5kg        Assessment: 1. 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 Vol excess / marked LE edema 3. 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 MSSA bacteremia/ septic polyarthritis - on Ancef x 8 wks, TEE was neg 6. Hep C+ 7. DM per primary 8. HTN - on MTP / hydralazine, BP's high normal, marked vol overload  Plan - extra HD tomorrow for fluid overload, decrease BP meds again (stop MTP)    Kelly Splinter MD  pager [phone]    cell [phone]  09/18/2014, 3:27 PM     Recent Labs Lab 09/13/14 0534 09/14/14 0950 09/15/14 1515  NA 136  135 136 132*  K 4.0  3.9 4.6 4.2  CL 99  99 101 99  CO2 30  33* 26 27  GLUCOSE 127*  125* 109* 173*  BUN 13  13 15 10   CREATININE 5.34*  5.22* 6.22* 5.24*  CALCIUM 8.1*  7.9* 8.0* 8.3*  PHOS 3.7 4.7* 3.3    Recent Labs Lab 09/13/14 0534 09/14/14 0950 09/15/14 1515  ALBUMIN 1.4* 1.4* 1.6*    Recent Labs Lab 09/13/14 0534 09/14/14 0950 09/15/14 1500  WBC 6.7 7.4 12.7*  HGB 8.0* 8.1* 9.0*  HCT 26.0* 26.3* 28.8*  MCV 94.5 94.9 94.1  PLT 359 385 437*   . antiseptic oral rinse  7 mL Mouth Rinse q12n4p  . aspirin  81 mg Oral Daily  .  ceFAZolin (ANCEF) IV  2 g Intravenous Q M,W,F-HD  . chlorhexidine  15 mL Mouth Rinse BID  . cyclobenzaprine  5 mg Oral BID  . [START ON 09/19/2014] darbepoetin (ARANESP)  injection - DIALYSIS  200 mcg Intravenous Q Tue-HD  . enoxaparin (LOVENOX) injection  30 mg Subcutaneous Q24H  . famotidine  20 mg Oral Daily  . feeding supplement (NEPRO CARB STEADY)  237 mL Oral TID WC  . ferric gluconate (FERRLECIT/NULECIT) IV  125 mg Intravenous Q M,W,F-HD  . gabapentin  100 mg Oral BID  . hydrALAZINE  25 mg Oral BID  . insulin aspart  0-9 Units Subcutaneous TID WC  . lanthanum  1,000 mg Oral TID WC  . metoprolol tartrate  50 mg Oral BID  . multivitamin  1 tablet Oral QHS  . nicotine  21 mg Transdermal Daily  . oxyCODONE  10 mg Oral BID WC  . polyethylene glycol  17 g Oral BID  . sodium chloride  10-40 mL Intracatheter Q12H     acetaminophen, ALPRAZolam, bisacodyl, diphenhydrAMINE, gi cocktail, guaiFENesin-dextromethorphan, hydrocerin, magic mouthwash w/lidocaine, methocarbamol, ondansetron (ZOFRAN) IV, oxyCODONE, sodium chloride, sodium chloride, sorbitol, traZODone, zolpidem

## 2014-09-19 ENCOUNTER — Inpatient Hospital Stay (HOSPITAL_COMMUNITY): Payer: Self-pay | Admitting: Occupational Therapy

## 2014-09-19 ENCOUNTER — Inpatient Hospital Stay (HOSPITAL_COMMUNITY): Payer: Medicaid Other

## 2014-09-19 ENCOUNTER — Inpatient Hospital Stay (HOSPITAL_COMMUNITY): Payer: Self-pay

## 2014-09-19 ENCOUNTER — Inpatient Hospital Stay (HOSPITAL_COMMUNITY): Payer: Medicaid Other | Admitting: Physical Therapy

## 2014-09-19 LAB — HEPATITIS B SURFACE ANTIGEN: Hepatitis B Surface Ag: NEGATIVE

## 2014-09-19 LAB — GLUCOSE, CAPILLARY
Glucose-Capillary: 104 mg/dL — ABNORMAL HIGH (ref 70–99)
Glucose-Capillary: 124 mg/dL — ABNORMAL HIGH (ref 70–99)
Glucose-Capillary: 135 mg/dL — ABNORMAL HIGH (ref 70–99)
Glucose-Capillary: 99 mg/dL (ref 70–99)

## 2014-09-19 MED ORDER — DARBEPOETIN ALFA 200 MCG/0.4ML IJ SOSY
PREFILLED_SYRINGE | INTRAMUSCULAR | Status: AC
  Administered 2014-09-19: 200 ug via INTRAVENOUS
  Filled 2014-09-19: qty 0.4

## 2014-09-19 NOTE — Progress Notes (Signed)
Occupational Therapy Session Note  Patient Details  Name: Brandy Houston MRN: [id number] Date of Birth: 1956/05/31  Today's Date: 09/19/2014 OT Individual Time: 0800-0900 OT Individual Time Calculation (min): 60 min    Short Term Goals: Week 1:  OT Short Term Goal 1 (Week 1): Pt. will bathe self with supervision OT Short Term Goal 2 (Week 1): Pt. will dress UB with supervision OT Short Term Goal 3 (Week 1): Pt will dress LB with minimal assist OT Short Term Goal 4 (Week 1): Pt. will go from sit to stand with min assist OT Short Term Goal 5 (Week 1): Pt. will transfer to toilet with minimal assist  Skilled Therapeutic Interventions/Progress Updates:    Pt engaged in BADL retraining including bathing at shower level and dressing with sit<>stand from w/c. Pt required mod A for stand pivot transfers w/c<>shower seat.  Pt required mod A/min A for sit<>stand from w/c to pull up pants.  Pt requires extra time to complete tasks and multiple rest breaks throughout session.  Pt fatigues quickly when standing and requires mod A to correct posture.  Focus on activity tolerance, sit<>stand, functional transfers, standing balance, and safety awareness.  Therapy Documentation Precautions:  Precautions Precautions: Fall Precaution Comments: Will sit without warning when fatigued; bring chair behind for safety Restrictions Weight Bearing Restrictions: Yes RLE Weight Bearing: Weight bearing as tolerated LLE Weight Bearing: Weight bearing as tolerated Pain: Pain Assessment Pain Assessment: 0-10 Pain Score: 4  Pain Type: Acute pain Pain Location: Knee Pain Orientation: Left Pain Descriptors / Indicators: Tender;Tightness Pain Frequency: Intermittent Pain Onset: With Activity Pain Intervention(s): Repositioned  See FIM for current functional status  Therapy/Group: Individual Therapy  Leroy Libman 09/19/2014, 10:00 AM

## 2014-09-19 NOTE — Progress Notes (Signed)
Physical Therapy Session Note  Patient Details  Name: Brandy Houston MRN: [id number] Date of Birth: 22-May-1956  Today's Date: 09/19/2014 PT Individual Time: 0900-1000 PT Individual Time Calculation (min): 60 min   Short Term Goals: Week 1:  PT Short Term Goal 1 (Week 1): Patient will perform bed mobility with supervision. PT Short Term Goal 2 (Week 1): Patient will consistently perform sit <> stand with min A. PT Short Term Goal 3 (Week 1): Patient will ambulate 100 ft using RW with supervision. PT Short Term Goal 4 (Week 1): Patient will negotiate up/down 4 stairs using 2 rails with min A.   Skilled Therapeutic Interventions/Progress Updates:   Session focused on functional transfers, ambulation, standing tolerance, and endurance. Patient received sitting in wheelchair, propelled self using BUE x 75 ft with supervision. In ADL apartment, patient ambulated 15 ft using RW with min A in home environment to bed and transferred sit <> supine with supervision on flat surface. Patient transferred sit > stand from bed with min A and ambulated to low compliant couch surface. From couch surface, patient required max A for sit > stand with use of momentum and +2 assist to stabilize RW. Gait training using RW x 50 ft with min A. Patient negotiated up/down four 6" steps using 2 rails with mod-max A, max verbal cues for safe step-to pattern with episode of knee buckling when patient ascended leading with LLE. Patient participated in game of checkers standing at tall table with min guard assist x 12 min + 4 min to reset up checker board. Patient left sitting in wheelchair with quick release belt on, all needs within reach.   Therapy Documentation Precautions:  Precautions Precautions: Fall Precaution Comments: Will sit without warning when fatigued; bring chair behind for safety Restrictions Weight Bearing Restrictions: Yes RLE Weight Bearing: Weight bearing as tolerated LLE Weight Bearing: Weight bearing as  tolerated Pain: Pain Assessment Pain Assessment: 0-10 Pain Score: 4  Pain Type: Acute pain Pain Location: Knee Pain Orientation: Left Pain Descriptors / Indicators: Tender;Tightness Pain Frequency: Intermittent Pain Onset: With Activity Pain Intervention(s): Ambulation/increased activity;Repositioned  See FIM for current functional status  Therapy/Group: Individual Therapy  Laretta Alstrom 09/19/2014, 9:59 AM

## 2014-09-19 NOTE — Progress Notes (Signed)
Occupational Therapy Session Note  Patient Details  Name: Brandy Houston MRN: [id number] Date of Birth: 08-Jan-1956  Today's Date: 09/19/2014 OT Individual Time: 1330-1400 OT Individual Time Calculation (min): 30 min    Short Term Goals: Week 1:  OT Short Term Goal 1 (Week 1): Pt. will bathe self with supervision OT Short Term Goal 2 (Week 1): Pt. will dress UB with supervision OT Short Term Goal 3 (Week 1): Pt will dress LB with minimal assist OT Short Term Goal 4 (Week 1): Pt. will go from sit to stand with min assist OT Short Term Goal 5 (Week 1): Pt. will transfer to toilet with minimal assist  Skilled Therapeutic Interventions/Progress Updates:    Pt seen for 1:1 OT session with focus on activity tolerance, standing balance, and functional transfers. Pt received sitting in w/c agreeable to complete laundry task initiated in previous therapy session. Pt propelled self room>laundry room with 2 rest breaks due to fatigue. Retrieved clothing items from w/c level then returned to room. Pt stood approx 5 min with min-mod A for balance to fold laundry. Pt completed task alternating UE support and with no UE support on RW. Pt requesting to change into gown for dialysis, therefore stood again with min A to remove pants. Pt required cues for safety to don non-slip socks on top of TED hose before standing. Pt completed stand pivot transfer w/c>bed with min A using RW then transitioned to supine at supervision level. Pt left supine in bed with all needs in reach.   Therapy Documentation Precautions:  Precautions Precautions: Fall Precaution Comments: Will sit without warning when fatigued; bring chair behind for safety Restrictions Weight Bearing Restrictions: Yes RLE Weight Bearing: Weight bearing as tolerated LLE Weight Bearing: Weight bearing as tolerated General:   Vital Signs: Therapy Vitals Temp: 98.8 F (37.1 C) Temp Source: Oral Pulse Rate: (!) 101 Resp: 18 BP: (!) 147/84  mmHg Patient Position (if appropriate): Lying Oxygen Therapy SpO2: 97 % O2 Device: Not Delivered Pain: Pain Assessment Pain Assessment: 0-10 Pain Score: 0-No pain Pain Type: Acute pain Pain Location: Arm Pain Orientation: Left Pain Descriptors / Indicators: Aching Pain Onset: With Activity Pain Intervention(s): Medication (See eMAR)  See FIM for current functional status  Therapy/Group: Individual Therapy  Duayne Cal 09/19/2014, 4:02 PM

## 2014-09-19 NOTE — Progress Notes (Signed)
Occupational Therapy Session Note  Patient Details  Name: Brandy Houston MRN: [id number] Date of Birth: Nov 10, 1955  Today's Date: 09/19/2014 OT Individual Time: 1103-1200 OT Individual Time Calculation (min): 57 min    Skilled Therapeutic Interventions/Progress Updates:    Pt began session with wheelchair mobility down to the pt laundry room with supervision and increased time.  Had pt propel wheelchair by just using her UEs to help with strengthening the LUE.  Once down in the room had pt stand with RW and place clothes from the washing machine to the dryer with mod assist for sit to stand.  Once finished had her roll the wheelchair to the gym where we worked on sit to stand intervals, standing balance, and LUE use.  Pt completed sit to stand from therapy mat with min assist.  She tends to use momentum to help with the standing so focused on trying to go slower.  Decreased timing of hip and knee extension noted with decreased ability to maintain knee and hip extension bilaterally when reaching and placing clothespins with the LUE.  Performed 6-7 intervals of this with standing lasting for 1-3 mins and rest breaks in between.    Therapy Documentation Precautions:  Precautions Precautions: Fall Precaution Comments: Will sit without warning when fatigued; bring chair behind for safety Restrictions Weight Bearing Restrictions: Yes RLE Weight Bearing: Weight bearing as tolerated LLE Weight Bearing: Weight bearing as tolerated  Vital Signs: Therapy Vitals Temp: 98.8 F (37.1 C) Temp Source: Oral Pulse Rate: (!) 101 Resp: 18 BP: (!) 147/84 mmHg Patient Position (if appropriate): Lying Oxygen Therapy SpO2: 97 % O2 Device: Not Delivered Pain: Pain Assessment Pain Assessment: 0-10 Pain Score: 0-No pain Pain Type: Acute pain Pain Location: Arm Pain Orientation: Left Pain Descriptors / Indicators: Aching Pain Onset: With Activity Pain Intervention(s): Medication (See eMAR) ADL: See FIM  for current functional status  Therapy/Group: Individual Therapy  Raymir Frommelt OTR/L 09/19/2014, 3:56 PM

## 2014-09-19 NOTE — Progress Notes (Signed)
[facility]ABILITATION     PROGRESS NOTE    Subjective/Complaints: Left arm improving. Wondering when she's going to be able to go home.   Objective: Vital Signs: Blood pressure 122/54, pulse 93, temperature 99.2 F (37.3 C), temperature source Oral, resp. rate 18, height 5\' 7"  (1.702 m), weight 89 kg (196 lb 3.4 oz), last menstrual period 03/16/2002, SpO2 94 %. No results found. No results for input(s): WBC, HGB, HCT, PLT in the last 72 hours.  Recent Labs  09/18/14 1505  NA 134*  K 3.7  CL 99  GLUCOSE 120*  BUN 14  CREATININE 4.97*  CALCIUM 8.0*   CBG (last 3)   Recent Labs  09/18/14 1845 09/18/14 2144 09/19/14 0647  GLUCAP 100* 128* 104*    Wt Readings from Last 3 Encounters:  09/19/14 89 kg (196 lb 3.4 oz)  09/14/14 92.2 kg (203 lb 4.2 oz)  08/08/14 89.812 kg (198 lb)    Physical Exam:  Constitutional: She is oriented to person, place, and time. She appears well-developed and well-nourished.  HENT:  Head: Normocephalic and atraumatic.  Eyes: Conjunctivae are normal. Pupils are equal, round, and reactive to light.  Neck: Normal range of motion. Neck supple.  Cardiovascular: Normal rate, regular rhythm and normal heart sounds.  Respiratory: Effort normal and breath sounds normal.  GI: Soft. Bowel sounds are normal.  Musculoskeletal: She exhibits edema (1+ edema L-forearm with less tenderness).  Healing incisions bilateral knees with edema L>R. Left knee with pain on ROM. Moves RUE without difficulty. LUE still tender along AVG site. Mild edema and warmth noted  Neurological: She is alert and oriented to person, place, and time.  Skin: Skin is warm and dry.  Psychiatric: Her speech is normal. Judgment and thought content normal. Her mood appears pleasant, slightly anxious.     Assessment/Plan: 1. Functional deficits secondary to septic joints with subsequent debilitation which require 3+ hours per day of interdisciplinary  therapy in a comprehensive inpatient rehab setting. Physiatrist is providing close team supervision and 24 hour management of active medical problems listed below. Physiatrist and rehab team continue to assess barriers to discharge/monitor patient progress toward functional and medical goals. FIM: FIM - Bathing Bathing Steps Patient Completed: Chest, Right Arm, Left Arm, Abdomen, Front perineal area, Right upper leg, Left upper leg, Right lower leg (including foot), Left lower leg (including foot) Bathing: 3: Mod-Patient completes 5-7 1f 10 parts or 50-74%  FIM - Upper Body Dressing/Undressing Upper body dressing/undressing steps patient completed: Thread/unthread right bra strap, Thread/unthread left bra strap, Hook/unhook bra, Thread/unthread left sleeve of pullover shirt/dress, Thread/unthread right sleeve of pullover shirt/dresss, Put head through opening of pull over shirt/dress, Pull shirt over trunk Upper body dressing/undressing: 4: Min-Patient completed 75 plus % of tasks FIM - Lower Body Dressing/Undressing Lower body dressing/undressing steps patient completed: Don/Doff right shoe, Don/Doff left shoe, Fasten/unfasten left shoe, Fasten/unfasten right shoe Lower body dressing/undressing: 4: Min-Patient completed 75 plus % of tasks  FIM - Toileting Toileting: 0: Activity did not occur  FIM - Air cabin crew Transfers: 1-Two helpers  FIM - Control and instrumentation engineer Devices: Environmental consultant, Arm rests Bed/Chair Transfer: 5: Supine > Sit: Supervision (verbal cues/safety issues), 5: Sit > Supine: Supervision (verbal cues/safety issues), 4: Bed > Chair or W/C: Min A (steadying Pt. > 75%), 4: Chair or W/C > Bed: Min A (steadying Pt. > 75%)  FIM - Locomotion: Wheelchair Distance: 75 Locomotion: Wheelchair: 2: Travels 50 - 149 ft with  supervision, cueing or coaxing FIM - Locomotion: Ambulation Locomotion: Ambulation Assistive Devices: Walker -  Rolling Ambulation/Gait Assistance: 4: Min assist Locomotion: Ambulation: 2: Travels 50 - 149 ft with minimal assistance (Pt.>75%)  Comprehension Comprehension Mode: Auditory Comprehension: 5-Understands complex 90% of the time/Cues < 10% of the time  Expression Expression Mode: Verbal Expression: 4-Expresses basic 75 - 89% of the time/requires cueing 10 - 24% of the time. Needs helper to occlude trach/needs to repeat words.  Social Interaction Social Interaction: 6-Interacts appropriately with others with medication or extra time (anti-anxiety, antidepressant).  Problem Solving Problem Solving Mode: Asleep Problem Solving: 5-Solves complex 90% of the time/cues < 10% of the time  Memory Memory: 7-Complete Independence: No helper  Medical Problem List and Plan: 1. 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 DVT Prophylaxis/Anticoagulation: Pharmaceutical: Lovenox 3. Pain Management:   oxycodone 10 mg prn.   -anxiety mgt 4. Adjustment reaction/ Mood: Team to provide ego support. Patient not interested in antidepressant at this time. LCSW to follow for evaluation and support.  5. Neuropsych: This patient is capable of making decisions on her own behalf. 6. Skin/Wound Care: Routine pressure relief measures. Elevate LUE when in bed to help with edema control 7. Fluids/Electrolytes/Nutrition: Needs 1200 FR due to ESRD. Routine check of lytes with HD. 8. ESRD: Schedule HD past therapy sessions to help with tolerance and endurance. on 9. Septic arthritis:  continue Ancef  thru 10/04/14.   10. DM type 2 with neuropathy: Resumed gabapentin as complaining of significant neuropathic symptoms BLE. Continue to monitor BS ac/hs basis and use SSI for elevated BS. Off glipizide and currently diet controlled as po intake is poor.   -sugars under control 11. HTN: Monitor every 8 hours. Continue Hydralazine bid.   LOS (Days) 5 A FACE  TO FACE EVALUATION WAS PERFORMED  Mehkai Gallo T 09/19/2014 7:46 AM

## 2014-09-19 NOTE — Progress Notes (Signed)
  [facility] Progress Note   Subjective: no complaints  Filed Vitals:   09/18/14 1800 09/18/14 1803 09/19/14 0500 09/19/14 0615  BP: 142/71 158/83  122/54  Pulse: 104 104  93  Temp:  97.9 F (36.6 C)  99.2 F (37.3 C)  TempSrc:  Oral  Oral  Resp:  18  18  Height:      Weight:  90 kg (198 lb 6.6 oz) 89 kg (196 lb 3.4 oz)   SpO2:  99%  94%   Exam: Alert, no distress, on HD No jvd Chest clear bilat RRR no MRG Abd soft, NTND, no ascites, mod obese 2-3+ bilat pitting edema of upper and lower legs Neuro is alert, Ox 3  HD: new start Max inpatient weight 112kg, mim inpt weight 90.5kg        Assessment: 1. 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 Vol excess / LE edema 3. 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 MSSA bacteremia/ septic polyarthritis - on Ancef x 8 wks, TEE was neg 6. Hep C+ 7. DM per primary 8. HTN - on hydralazine, vol excess  Plan - short extra HD today, reg HD tomorrow. Get volume down.     Kelly Splinter MD  pager [phone]    cell [phone]  09/19/2014, 1:54 PM     Recent Labs Lab 09/14/14 0950 09/15/14 1515 09/18/14 1505  NA 136 132* 134*  K 4.6 4.2 3.7  CL 101 99 99  CO2 26 27 29   GLUCOSE 109* 173* 120*  BUN 15 10 14   CREATININE 6.22* 5.24* 4.97*  CALCIUM 8.0* 8.3* 8.0*  PHOS 4.7* 3.3 2.6    Recent Labs Lab 09/14/14 0950 09/15/14 1515 09/18/14 1505  ALBUMIN 1.4* 1.6* 1.5*    Recent Labs Lab 09/13/14 0534 09/14/14 0950 09/15/14 1500  WBC 6.7 7.4 12.7*  HGB 8.0* 8.1* 9.0*  HCT 26.0* 26.3* 28.8*  MCV 94.5 94.9 94.1  PLT 359 385 437*   . antiseptic oral rinse  7 mL Mouth Rinse q12n4p  . aspirin  81 mg Oral Daily  .  ceFAZolin (ANCEF) IV  2 g Intravenous Q M,W,F-HD  . chlorhexidine  15 mL Mouth Rinse BID  . cyclobenzaprine  5 mg Oral BID  . darbepoetin (ARANESP) injection - DIALYSIS  200 mcg Intravenous Q Tue-HD  . enoxaparin (LOVENOX) injection  30 mg Subcutaneous  Q24H  . famotidine  20 mg Oral Daily  . feeding supplement (NEPRO CARB STEADY)  237 mL Oral TID WC  . ferric gluconate (FERRLECIT/NULECIT) IV  125 mg Intravenous Q M,W,F-HD  . gabapentin  100 mg Oral BID  . hydrALAZINE  25 mg Oral BID  . insulin aspart  0-9 Units Subcutaneous TID WC  . lanthanum  1,000 mg Oral TID WC  . multivitamin  1 tablet Oral QHS  . nicotine  21 mg Transdermal Daily  . oxyCODONE  10 mg Oral BID WC  . polyethylene glycol  17 g Oral BID  . sodium chloride  10-40 mL Intracatheter Q12H     acetaminophen, ALPRAZolam, bisacodyl, diphenhydrAMINE, gi cocktail, guaiFENesin-dextromethorphan, hydrocerin, magic mouthwash w/lidocaine, methocarbamol, ondansetron (ZOFRAN) IV, oxyCODONE, sodium chloride, sodium chloride, sorbitol, traZODone, zolpidem

## 2014-09-20 ENCOUNTER — Inpatient Hospital Stay (HOSPITAL_COMMUNITY): Payer: Medicaid Other

## 2014-09-20 ENCOUNTER — Inpatient Hospital Stay (HOSPITAL_COMMUNITY): Payer: Medicaid Other | Admitting: Physical Therapy

## 2014-09-20 LAB — CBC
HCT: 28.5 % — ABNORMAL LOW (ref 36.0–46.0)
Hemoglobin: 8.6 g/dL — ABNORMAL LOW (ref 12.0–15.0)
MCH: 28.6 pg (ref 26.0–34.0)
MCHC: 30.2 g/dL (ref 30.0–36.0)
MCV: 94.7 fL (ref 78.0–100.0)
Platelets: 290 10*3/uL (ref 150–400)
RBC: 3.01 MIL/uL — ABNORMAL LOW (ref 3.87–5.11)
RDW: 15.6 % — ABNORMAL HIGH (ref 11.5–15.5)
WBC: 9.8 10*3/uL (ref 4.0–10.5)

## 2014-09-20 LAB — RENAL FUNCTION PANEL
Albumin: 1.5 g/dL — ABNORMAL LOW (ref 3.5–5.2)
Anion gap: 6 (ref 5–15)
BUN: 11 mg/dL (ref 6–23)
CO2: 29 mmol/L (ref 19–32)
Calcium: 7.7 mg/dL — ABNORMAL LOW (ref 8.4–10.5)
Chloride: 99 mmol/L (ref 96–112)
Creatinine, Ser: 4.1 mg/dL — ABNORMAL HIGH (ref 0.50–1.10)
GFR calc Af Amer: 13 mL/min — ABNORMAL LOW (ref >90)
GFR calc non Af Amer: 11 mL/min — ABNORMAL LOW (ref >90)
Glucose, Bld: 124 mg/dL — ABNORMAL HIGH (ref 70–99)
Phosphorus: 2.3 mg/dL (ref 2.3–4.6)
Potassium: 3.9 mmol/L (ref 3.5–5.1)
Sodium: 134 mmol/L — ABNORMAL LOW (ref 135–145)

## 2014-09-20 LAB — GLUCOSE, CAPILLARY
Glucose-Capillary: 118 mg/dL — ABNORMAL HIGH (ref 70–99)
Glucose-Capillary: 149 mg/dL — ABNORMAL HIGH (ref 70–99)
Glucose-Capillary: 147 mg/dL — ABNORMAL HIGH (ref 70–99)

## 2014-09-20 NOTE — Progress Notes (Signed)
Physical Therapy Session Note  Patient Details  Name: Brandy Houston MRN: [id number] Date of Birth: [birth year]  Today's Date: 09/20/2014 PT Individual Time: 1000-1100 PT Individual Time Calculation (min): 60 min   Short Term Goals: Week 1:  PT Short Term Goal 1 (Week 1): Patient will perform bed mobility with supervision. PT Short Term Goal 2 (Week 1): Patient will consistently perform sit <> stand with min A. PT Short Term Goal 3 (Week 1): Patient will ambulate 100 ft using RW with supervision. PT Short Term Goal 4 (Week 1): Patient will negotiate up/down 4 stairs using 2 rails with min A.   Skilled Therapeutic Interventions/Progress Updates:   Patient received sitting in wheelchair, PA departing. Session focused on functional mobility training, activity tolerance and patient education. Patient reporting that she is thinking about leaving AMA as she wants to go home now. Patient educated regarding current need for physical assist with mobility/physical impairments and she will be unable to do anything independently at home if she leaves at this time. Extensive education provided regarding goals and purpose of therapy. Patient verbalized understanding. Patient propelled wheelchair using BUE 2 x 150 ft with supervision in controlled environment. Planned failure with sit <> stand transfers as patient reports she can stand up by herself. After multiple attempts from wheelchair and arm chair, patient agreeable to therapist providing min assist for forward weight shift with sit <>stand with UE support and verbal/visual cues for technique. Gait training using RW 2 x 100 ft + 2 x 25 ft with min guard for steadying assist, verbal cues for forward gaze and upright posture. Patient performed simulated car transfer to truck height using RW with min guard and verbal cues for technique. Patient negotiated up/down six 6.5" steps using 2 rails with mod A, initial demonstration for safe step-to pattern and mod verbal  cues for sequencing. Patient left sitting in wheelchair with all needs within reach.   Therapy Documentation Precautions:  Precautions Precautions: Fall Precaution Comments: Will sit without warning when fatigued; bring chair behind for safety Restrictions Weight Bearing Restrictions: Yes RLE Weight Bearing: Weight bearing as tolerated LLE Weight Bearing: Weight bearing as tolerated Pain: Pain Assessment Pain Assessment: 0-10 Pain Score: 4  Pain Type: Acute pain Pain Location: Generalized Pain Orientation: Right;Left Pain Onset: On-going Pain Intervention(s): Ambulation/increased activity (premedicated)  See FIM for current functional status  Therapy/Group: Individual Therapy  Laretta Alstrom 09/20/2014, 10:57 AM

## 2014-09-20 NOTE — Progress Notes (Signed)
ANTIBIOTIC CONSULT NOTE - FOLLOW UP  Pharmacy Consult for ancef Indication: MSSA bacteremia  Allergies  Allergen Reactions  . Compazine [Prochlorperazine] Shortness Of Breath and Swelling    TONGUE SWELLS  . Omnipaque [Iohexol] Hives  . Shellfish-Derived Products Anaphylaxis  . Iodinated Diagnostic Agents Rash    Patient Measurements: Height: 5\' 7"  (170.2 cm) Weight: 194 lb 14.2 oz (88.4 kg) IBW/kg (Calculated) : 61.6 Adjusted Body Weight:   Vital Signs: Temp: 98.5 F (36.9 C) (04/06 0629) Temp Source: Oral (04/06 0629) BP: 127/63 mmHg (04/06 0629) Pulse Rate: 99 (04/06 0629) Intake/Output from previous day: 04/05 0701 - 04/06 0700 In: 740 [P.O.:720; I.V.:20] Out: 2500  Intake/Output from this shift:    Labs:  Recent Labs  09/18/14 1505  CREATININE 4.97*   Estimated Creatinine Clearance: 13.9 mL/min (by C-G formula based on Cr of 4.97). No results for input(s): VANCOTROUGH, VANCOPEAK, VANCORANDOM, GENTTROUGH, GENTPEAK, GENTRANDOM, TOBRATROUGH, TOBRAPEAK, TOBRARND, AMIKACINPEAK, AMIKACINTROU, AMIKACIN in the last 72 hours.   Microbiology: Recent Results (from the past 720 hour(s))  Culture, Urine     Status: None   Collection Time: 09/02/14  3:04 PM  Result Value Ref Range Status   Specimen Description URINE, CATHETERIZED  Final   Special Requests NONE  Final   Colony Count   Final    >=100,000 COLONIES/ML Performed at [facility]    Culture   Final    PSEUDOMONAS AERUGINOSA Performed at [facility]    Report Status 09/05/2014 FINAL  Final   Organism ID, Bacteria PSEUDOMONAS AERUGINOSA  Final      Susceptibility   Pseudomonas aeruginosa - MIC*    CEFEPIME 2 SENSITIVE Sensitive     CEFTAZIDIME 4 SENSITIVE Sensitive     CIPROFLOXACIN 0.5 SENSITIVE Sensitive     GENTAMICIN <=1 SENSITIVE Sensitive     IMIPENEM >=16 RESISTANT Resistant     PIP/TAZO 8 SENSITIVE Sensitive     TOBRAMYCIN <=1 SENSITIVE Sensitive     * PSEUDOMONAS AERUGINOSA   Surgical pcr screen     Status: None   Collection Time: 09/13/14  1:12 AM  Result Value Ref Range Status   MRSA, PCR NEGATIVE NEGATIVE Final   Staphylococcus aureus NEGATIVE NEGATIVE Final    Comment:        The Xpert SA Assay (FDA approved for NASAL specimens in patients over 62 years of age), is one component of a comprehensive surveillance program.  Test performance has been validated by [facility] for patients greater than or equal to 36 year old. It is not intended to diagnose infection nor to guide or monitor treatment.     Anti-infectives    Start     Dose/Rate Route Frequency Ordered Stop   09/16/14 1200  ceFAZolin (ANCEF) IVPB 2 g/50 mL premix  Status:  Discontinued     2 g 100 mL/hr over 30 Minutes Intravenous Every T-Th-Sa (Hemodialysis) 09/15/14 0942 09/15/14 1003   09/15/14 1800  ceFAZolin (ANCEF) IVPB 2 g/50 mL premix  Status:  Discontinued     2 g 100 mL/hr over 30 Minutes Intravenous Every M-W-F (Hemodialysis) 09/14/14 2044 09/15/14 0942   09/15/14 1200  ceFAZolin (ANCEF) IVPB 2 g/50 mL premix     2 g 100 mL/hr over 30 Minutes Intravenous Every M-W-F (Hemodialysis) 09/15/14 1003        Assessment: 59 yo female with MSSA bacteremia is currently on ancef.  Patient has history of ESRD on HD MWF.  Tmax 100  Goal of Therapy:  Resolution of infection  Plan:  -Ancef 2g IV qMWF -Monitor clinical progress    Michale Weikel, Tsz-Yin 09/20/2014,8:06 AM

## 2014-09-20 NOTE — Progress Notes (Signed)
  [facility] Progress Note   Subjective: no complaints  Filed Vitals:   09/19/14 2024 09/19/14 2110 09/20/14 0003 09/20/14 0629  BP: 144/69 136/65 129/71 127/63  Pulse: 106 105  99  Temp: 98.8 F (37.1 C) 100 F (37.8 C)  98.5 F (36.9 C)  TempSrc: Oral Oral  Oral  Resp: 18 18  18   Height:      Weight: 87.5 kg (192 lb 14.4 oz)   88.4 kg (194 lb 14.2 oz)  SpO2: 96% 99%  98%   Exam: Alert, no distress, on HD No jvd Chest clear bilat RRR no MRG Abd soft, NTND, no ascites, mod obese 2-3+ bilat pitting edema of upper and lower legs Neuro is alert, Ox 3  ECHO LV 55%  HD: new start Max inpatient weight 112kg, mim inpt weight 90.5kg        Assessment: 1. 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 Vol excess / LE edema - wt coming down, still 10-20 lbs of fluid in legs 3. 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 MSSA bacteremia/ septic polyarthritis - on Ancef x 8 wks, TEE was neg 6. Hep C+ 7. DM per primary 8. HTN - on hydralazine, vol excess  Plan - daily HD for now, stop hydral    Kelly Splinter MD  pager [phone]    cell [phone]  09/20/2014, 11:34 AM     Recent Labs Lab 09/14/14 0950 09/15/14 1515 09/18/14 1505  NA 136 132* 134*  K 4.6 4.2 3.7  CL 101 99 99  CO2 26 27 29   GLUCOSE 109* 173* 120*  BUN 15 10 14   CREATININE 6.22* 5.24* 4.97*  CALCIUM 8.0* 8.3* 8.0*  PHOS 4.7* 3.3 2.6    Recent Labs Lab 09/14/14 0950 09/15/14 1515 09/18/14 1505  ALBUMIN 1.4* 1.6* 1.5*    Recent Labs Lab 09/14/14 0950 09/15/14 1500  WBC 7.4 12.7*  HGB 8.1* 9.0*  HCT 26.3* 28.8*  MCV 94.9 94.1  PLT 385 437*   . antiseptic oral rinse  7 mL Mouth Rinse q12n4p  . aspirin  81 mg Oral Daily  .  ceFAZolin (ANCEF) IV  2 g Intravenous Q M,W,F-HD  . chlorhexidine  15 mL Mouth Rinse BID  . cyclobenzaprine  5 mg Oral BID  . darbepoetin (ARANESP) injection - DIALYSIS  200 mcg Intravenous Q Tue-HD  . enoxaparin  (LOVENOX) injection  30 mg Subcutaneous Q24H  . famotidine  20 mg Oral Daily  . feeding supplement (NEPRO CARB STEADY)  237 mL Oral TID WC  . ferric gluconate (FERRLECIT/NULECIT) IV  125 mg Intravenous Q M,W,F-HD  . gabapentin  100 mg Oral BID  . hydrALAZINE  25 mg Oral BID  . insulin aspart  0-9 Units Subcutaneous TID WC  . lanthanum  1,000 mg Oral TID WC  . multivitamin  1 tablet Oral QHS  . nicotine  21 mg Transdermal Daily  . oxyCODONE  10 mg Oral BID WC  . polyethylene glycol  17 g Oral BID  . sodium chloride  10-40 mL Intracatheter Q12H     acetaminophen, ALPRAZolam, bisacodyl, diphenhydrAMINE, gi cocktail, guaiFENesin-dextromethorphan, hydrocerin, magic mouthwash w/lidocaine, methocarbamol, ondansetron (ZOFRAN) IV, oxyCODONE, sodium chloride, sodium chloride, sorbitol, traZODone, zolpidem

## 2014-09-20 NOTE — Progress Notes (Signed)
[facility]     PROGRESS NOTE    Subjective/Complaints: No new complaints. Had fair night.    Objective: Vital Signs: Blood pressure 127/63, pulse 99, temperature 98.5 F (36.9 C), temperature source Oral, resp. rate 18, height 5\' 7"  (1.702 m), weight 88.4 kg (194 lb 14.2 oz), last menstrual period 03/16/2002, SpO2 98 %. No results found. No results for input(s): WBC, HGB, HCT, PLT in the last 72 hours.  Recent Labs  09/18/14 1505  NA 134*  K 3.7  CL 99  GLUCOSE 120*  BUN 14  CREATININE 4.97*  CALCIUM 8.0*   CBG (last 3)   Recent Labs  09/19/14 1641 09/19/14 2114 09/20/14 0707  GLUCAP 135* 99 118*    Wt Readings from Last 3 Encounters:  09/20/14 88.4 kg (194 lb 14.2 oz)  09/14/14 92.2 kg (203 lb 4.2 oz)  08/08/14 89.812 kg (198 lb)    Physical Exam:  Constitutional: She is oriented to person, place, and time. She appears well-developed and well-nourished.  HENT:  Head: Normocephalic and atraumatic.  Eyes: Conjunctivae are normal. Pupils are equal, round, and reactive to light.  Neck: Normal range of motion. Neck supple.  Cardiovascular: Normal rate, regular rhythm and normal heart sounds.  Respiratory: Effort normal and breath sounds normal.  GI: Soft. Bowel sounds are normal.  Musculoskeletal: She exhibits edema (1+ edema L-forearm with less tenderness).  Healing incisions bilateral knees with edema L>R. Left knee with pain on ROM. Moves RUE without difficulty. LUE still tender along AVG site. Mild edema and warmth noted  Neurological: She is alert and oriented to person, place, and time.  Skin: Skin is warm and dry.  Psychiatric: Her speech is normal. Judgment and thought content normal. Her mood appears pleasant, slightly anxious.     Assessment/Plan: 1. Functional deficits secondary to septic joints with subsequent debilitation which require 3+ hours per day of interdisciplinary therapy in a comprehensive  inpatient rehab setting. Physiatrist is providing close team supervision and 24 hour management of active medical problems listed below. Physiatrist and rehab team continue to assess barriers to discharge/monitor patient progress toward functional and medical goals. FIM: FIM - Bathing Bathing Steps Patient Completed: Chest, Right Arm, Left Arm, Abdomen, Front perineal area, Right upper leg, Left upper leg Bathing: 3: Mod-Patient completes 5-7 72f 10 parts or 50-74%  FIM - Upper Body Dressing/Undressing Upper body dressing/undressing steps patient completed: Thread/unthread right bra strap, Thread/unthread left bra strap, Hook/unhook bra, Thread/unthread left sleeve of pullover shirt/dress, Thread/unthread right sleeve of pullover shirt/dresss, Put head through opening of pull over shirt/dress, Pull shirt over trunk Upper body dressing/undressing: 5: Supervision: Safety issues/verbal cues FIM - Lower Body Dressing/Undressing Lower body dressing/undressing steps patient completed: Thread/unthread right underwear leg, Thread/unthread left underwear leg, Pull underwear up/down, Thread/unthread right pants leg, Pull pants up/down, Fasten/unfasten pants, Don/Doff right shoe, Don/Doff left shoe Lower body dressing/undressing: 4: Min-Patient completed 75 plus % of tasks  FIM - Toileting Toileting: 1: Two helpers (per [employer], NT)  FIM - Toilet Transfers Toilet Transfers: 1-Two helpers  FIM - Control and instrumentation engineer Devices: Environmental consultant, Arm rests Bed/Chair Transfer: 5: Supine > Sit: Supervision (verbal cues/safety issues), 5: Sit > Supine: Supervision (verbal cues/safety issues), 4: Bed > Chair or W/C: Min A (steadying Pt. > 75%), 4: Chair or W/C > Bed: Min A (steadying Pt. > 75%)  FIM - Locomotion: Wheelchair Distance: 75 ft Locomotion: Wheelchair: 2: Travels 50 - 149 ft with supervision, cueing  or coaxing FIM - Locomotion: Ambulation Locomotion: Ambulation Assistive  Devices: Administrator Ambulation/Gait Assistance: 4: Min assist Locomotion: Ambulation: 2: Travels 50 - 149 ft with minimal assistance (Pt.>75%)  Comprehension Comprehension Mode: Auditory Comprehension: 5-Understands complex 90% of the time/Cues < 10% of the time  Expression Expression Mode: Verbal Expression: 4-Expresses basic 75 - 89% of the time/requires cueing 10 - 24% of the time. Needs helper to occlude trach/needs to repeat words.  Social Interaction Social Interaction: 6-Interacts appropriately with others with medication or extra time (anti-anxiety, antidepressant).  Problem Solving Problem Solving Mode: Asleep Problem Solving: 5-Solves basic 90% of the time/requires cueing < 10% of the time  Memory Memory: 7-Complete Independence: No helper  Medical Problem List and Plan: 1. 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 DVT Prophylaxis/Anticoagulation: Pharmaceutical: Lovenox 3. Pain Management:   oxycodone 10 mg prn.   -anxiety mgt 4. Adjustment reaction/ Mood: Team to provide ego support. Patient not interested in antidepressant at this time. LCSW to follow for evaluation and support.  5. Neuropsych: This patient is capable of making decisions on her own behalf. 6. Skin/Wound Care: Routine pressure relief measures. Elevate LUE when in bed to help with edema control 7. Fluids/Electrolytes/Nutrition: Needs 1200 FR due to ESRD. Routine check of lytes with HD. 8. ESRD: Schedule HD past therapy sessions to help with tolerance and endurance. on 9. Septic arthritis:  continue Ancef  thru 10/04/14 with HD 10. DM type 2 with neuropathy: Resumed gabapentin as complaining of significant neuropathic symptoms BLE. Continue to monitor BS ac/hs basis and use SSI for elevated BS. Off glipizide and currently diet controlled  .   -sugars under control 11. HTN: Monitor every 8 hours. Continue Hydralazine bid.   LOS  (Days) 6 A FACE TO FACE EVALUATION WAS PERFORMED  Brandy Houston T 09/20/2014 7:15 AM

## 2014-09-20 NOTE — Progress Notes (Signed)
Occupational Therapy Session Note  Patient Details  Name: Brandy Houston MRN: [id number] Date of Birth: 03/04/1956  Today's Date: 09/20/2014 OT Individual Time: 0900-1000 and 1300-1415 OT Individual Time Calculation (min): 60 min and 75 min    Short Term Goals: Week 1:  OT Short Term Goal 1 (Week 1): Pt. will bathe self with supervision OT Short Term Goal 2 (Week 1): Pt. will dress UB with supervision OT Short Term Goal 3 (Week 1): Pt will dress LB with minimal assist OT Short Term Goal 4 (Week 1): Pt. will go from sit to stand with min assist OT Short Term Goal 5 (Week 1): Pt. will transfer to toilet with minimal assist  Skilled Therapeutic Interventions/Progress Updates:    Session 1: Pt seen for 1:1 OT session with focus on sit<>stand, standing balance, functional transfers, safety awareness, and activity tolerance. Pt received supine in bed requesting to sponge bathe this AM. Completed bathing and dressing sitting EOB with min A sit<>stand and min-mod A dynamic standing balance and knee buckling slightly with pt correcting. Educated on energy conservation and taking rest breaks when fatigued. Pt completed stand pivot transfer bed>w/c with min A using RW then completed grooming tasks from w/c level. Pt propelled self to ADL apartment with use of BUEs. Engaged in simple meal prep task of heating can of soup on stove top. Pt required max cues for safety throughout meal prep task to lock brakes, recall heated stove top, safety with managing hot pot, etc. Engaged in thorough discussion in regards to continuing practicing home management tasks secondary to poor safety awareness. Pt propelled self back to room and left with all needs in reach.  Session 2: Pt seen for 1:1 OT session with focus on functional mobility, sit<>stand, standing balance, activity tolerance, and safety awareness. Pt received sitting in w/c reporting feeling very fatigued however agreeable to therapy. Pt propelled self in w/c in  community environment, on/off elevators, hospital lobby, and gift shop with multiple rest breaks d//t fatigue and cues for use of BUEs. Pt required mod cues for safety awareness in community environment as pt very close to running into multiple items and walls on both right and left side. Pt completed sit<>stand during community activity 4x with min A and cues for management of w/c parts. Pt stood up to 3 minutes with min A while looking at items in gift shop. Returned to unit and practiced sit<>stand from mat table with emphasis on slow, controlled movements as pt often relies on momentum. Pt completed sit<>stand from mat table 6x with min A. Pt propelled self back to room and changed from day clothes to gown in preparation for dialysis. Pt completed with min A overall and returned to bed. Pt left supine in bed with all needs in reach.   Therapy Documentation Precautions:  Precautions Precautions: Fall Precaution Comments: Will sit without warning when fatigued; bring chair behind for safety Restrictions Weight Bearing Restrictions: Yes RLE Weight Bearing: Weight bearing as tolerated LLE Weight Bearing: Weight bearing as tolerated General:   Vital Signs:   Pain: Pain Assessment Pain Assessment: 0-10 Pain Score: 4  Pain Type: Acute pain Pain Location: Generalized Pain Orientation: Right;Left Pain Onset: On-going Pain Intervention(s): Ambulation/increased activity (premedicated)  See FIM for current functional status  Therapy/Group: Individual Therapy  Duayne Cal 09/20/2014, 12:18 PM

## 2014-09-21 ENCOUNTER — Inpatient Hospital Stay (HOSPITAL_COMMUNITY): Payer: Medicaid Other

## 2014-09-21 ENCOUNTER — Inpatient Hospital Stay (HOSPITAL_COMMUNITY): Payer: Self-pay

## 2014-09-21 ENCOUNTER — Inpatient Hospital Stay (HOSPITAL_COMMUNITY): Payer: Medicaid Other | Admitting: Physical Therapy

## 2014-09-21 LAB — GLUCOSE, CAPILLARY
Glucose-Capillary: 109 mg/dL — ABNORMAL HIGH (ref 70–99)
Glucose-Capillary: 106 mg/dL — ABNORMAL HIGH (ref 70–99)
Glucose-Capillary: 160 mg/dL — ABNORMAL HIGH (ref 70–99)
Glucose-Capillary: 134 mg/dL — ABNORMAL HIGH (ref 70–99)
Glucose-Capillary: 128 mg/dL — ABNORMAL HIGH (ref 70–99)

## 2014-09-21 LAB — CBC
HCT: 26.8 % — ABNORMAL LOW (ref 36.0–46.0)
Hemoglobin: 8.3 g/dL — ABNORMAL LOW (ref 12.0–15.0)
MCH: 29.6 pg (ref 26.0–34.0)
MCHC: 31 g/dL (ref 30.0–36.0)
MCV: 95.7 fL (ref 78.0–100.0)
Platelets: 248 10*3/uL (ref 150–400)
RBC: 2.8 MIL/uL — ABNORMAL LOW (ref 3.87–5.11)
RDW: 15.6 % — ABNORMAL HIGH (ref 11.5–15.5)
WBC: 8.9 10*3/uL (ref 4.0–10.5)

## 2014-09-21 LAB — CREATININE, SERUM
Creatinine, Ser: 3.3 mg/dL — ABNORMAL HIGH (ref 0.50–1.10)
GFR calc Af Amer: 17 mL/min — ABNORMAL LOW (ref >90)
GFR calc non Af Amer: 14 mL/min — ABNORMAL LOW (ref >90)

## 2014-09-21 MED ORDER — SALINE SPRAY 0.65 % NA SOLN
1.0000 | NASAL | Status: DC | PRN
  Filled 2014-09-21: qty 44

## 2014-09-21 MED ORDER — ENOXAPARIN SODIUM 30 MG/0.3ML ~~LOC~~ SOLN
30.0000 mg | SUBCUTANEOUS | Status: DC
  Administered 2014-09-23 – 2014-09-29 (×7): 30 mg via SUBCUTANEOUS
  Filled 2014-09-21 (×10): qty 0.3

## 2014-09-21 NOTE — Progress Notes (Signed)
Patient with episode of coughing up bright red and dark red blood clots, moderate to large amount. Denies pain. Lungs clear, resp unlabored, airway patent.  VS obtained, see flow sheet. Rapid response called. PA [place] notified, new orders received. Charge nurse notified of situation.  Lonell Face, RN

## 2014-09-21 NOTE — Progress Notes (Signed)
Occupational Therapy Weekly Progress Note  Patient Details  Name: Brandy Houston MRN: [id number] Date of Birth: 1956/05/09  Beginning of progress report period: September 15, 2014 End of progress report period: September 21, 2014  Today's Date: 09/21/2014 OT Individual Time: 0800-0900 and 1300-1415 OT Individual Time Calculation (min): 60 min and 75 min    Patient has met 4 of 5 short term goals.  Patient has made good progress during this reporting period. Patient requires min-mod assist sit<>stand and min assist for pivoting with RW during functional transfers. Patient requires min assist for LB self-care tasks, primarily for dynamic standing balance, and setup assist for upper body. Patient requires mod-max cues for safety during transfers and management of w/c (e.g locking brakes). Patient's greatest barriers at this time are BLE pain, overall weakness, and decreased activity tolerance.   Patient continues to demonstrate the following deficits: decreased strength, decreased activity tolerance, decreased coordination, decreased standing balance, decreased safety awareness, decreased balance strategies, pain in BUE and BLEs  and therefore will continue to benefit from skilled OT intervention to enhance overall performance with BADLs, balance, activity tolerance, strength, safety awareness.  Patient progressing toward long term goals..  Continue plan of care.  OT Short Term Goals Week 1:  OT Short Term Goal 1 (Week 1): Pt. will bathe self with supervision OT Short Term Goal 1 - Progress (Week 1): Progressing toward goal OT Short Term Goal 2 (Week 1): Pt. will dress UB with supervision OT Short Term Goal 2 - Progress (Week 1): Met OT Short Term Goal 3 (Week 1): Pt will dress LB with minimal assist OT Short Term Goal 3 - Progress (Week 1): Met OT Short Term Goal 4 (Week 1): Pt. will go from sit to stand with min assist OT Short Term Goal 4 - Progress (Week 1): Met OT Short Term Goal 5 (Week 1): Pt. will  transfer to toilet with minimal assist OT Short Term Goal 5 - Progress (Week 1): Met Week 2:  OT Short Term Goal 1 (Week 2): Pt will complete LB dressing at supervision level OT Short Term Goal 2 (Week 2): Pt will complete toilet transfer at supervision level OT Short Term Goal 3 (Week 2): Pt will stand for 3 min at supervision level while engaging in dynamic balance task OT Short Term Goal 4 (Week 2): Pt will complete meal prep task at supervision level with min cues for safety  Skilled Therapeutic Interventions/Progress Updates:    Session 1: Pt seen for 1:1 OT session with focus on functional transfers, standing balance, safety awareness, and activity tolerance. Pt received supine in bed reporting "dizzinees" and "lightheaded." BP taken and vitals WFL. Pt reporting no recall of coughing blood clots last night and attributing this to feeling "tired." RN notified. Pt eager to continue with therapy. Completed stand pivot transfer bed>w/c with min A. Pt continued to report some lightheaded and no drop in BP noted. Completed sponge bath this AM for safety. Pt required min A for all sit<>stand and dynamic standing balance during self-care tasks. Pt tolerated standing 1-3 min on each trial with rest breaks following. Pt required max cues to lock w/c brakes prior to all sit<>stand. Following bathing and dressing, pt retrieved all clothing items from w/c level then propelled self to laundry room with focus on BUE strengthening (L>R). Washing machine occupied, therefore pt propelled self back to room. Pt left sitting in w/c and reminded to initiate laundry with PT.   Session 2: Pt seen for 1:1  OT session with focus on dynamic standing balance, activity tolerance, functional transfers, and safety awareness. Pt received supine in bed requesting to shower this PM as she was reporting feeling "much better." Ambulated bed>walk-in shower approx 15' with min A using RW and min cues for safety with turn change. Completed  bathing at supervision level using lateral lean technique and min A for sit<>stand during drying. Pt ambulated back to room with min A then completed dressing in w/c. Pt with improved control during sit<>stand, relying less on momentum. Therapist provided total A for transport to laundry room via w/c. Pt completed sit<>stand with SBA and CGA-SBA while retrieving items from washing machine and placing in dryer. Pt completed simulated bed making task at elevated mat table with min A for functional mobility and dynamic standing balance. Pt required 3 rest breaks due to fatigue. At end of session pt returned to bed and left with all needs in reach.  Therapy Documentation Precautions:  Precautions Precautions: Fall Precaution Comments: Will sit without warning when fatigued; bring chair behind for safety Restrictions Weight Bearing Restrictions: Yes RLE Weight Bearing: Weight bearing as tolerated LLE Weight Bearing: Weight bearing as tolerated General:   Vital Signs: Therapy Vitals Temp: 99 F (37.2 C) Temp Source: Oral Pulse Rate: (!) 105 Resp: 18 BP: 135/62 mmHg Patient Position (if appropriate): Lying Oxygen Therapy SpO2: 100 % O2 Device: Not Delivered Pain: Pt with pain in B knees in standing activities  See FIM for current functional status  Therapy/Group: Individual Therapy  Duayne Cal 09/21/2014, 6:58 AM

## 2014-09-21 NOTE — Progress Notes (Signed)
Called per floor RN for PT coughing up small dark red blood clots. Pt VSS, denies SOB or chest pain. PA called prior to my arrival, CBC and CXR ordered. RN advised to continue to monitor closely and notify myself and/ or provider for worsening changes.

## 2014-09-21 NOTE — Progress Notes (Signed)
Physical Therapy Weekly Progress Note  Patient Details  Name: Brandy Houston MRN: [id number] Date of Birth: March 15, 1956  Beginning of progress report period: September 15, 2014 End of progress report period: September 21, 2014  Today's Date: 09/21/2014 PT Individual Time: 1000-1100 PT Individual Time Calculation (min): 60 min   Patient has met 2 of 4 short term goals. Patient currently requires min-mod A for sit <> stand transfers using RW, min A for household distance gait in controlled setting, supervision for bed mobility and w/c mobility, and mod-max A for stairs using 2 rails. Patient requires mod cues for safety with use of RW and safe sequencing for stair negotiation. Patient is heavily dependent on momentum for mobility, especially when transferring sit > stand. Patient is limited by pain in bilateral shoulders and knees (L > R) and decreased activity tolerance. Patient continues to benefit from education regarding current impairments, purpose, and need for physical therapy to increase independence and decrease burden of care in order to be mod I at discharge.   Patient continues to demonstrate the following deficits: muscle weakness, pain in bilateral shoulder and knee joints (L > R), decreased balance and balance strategies, decreased cardiorespiratory endurance, edema, decreased safety awareness and therefore will continue to benefit from skilled PT intervention to enhance overall performance with activity tolerance, balance, postural control, ability to compensate for deficits, functional use of  right upper extremity, right lower extremity, left upper extremity and left lower extremity, awareness, coordination and safety.  Patient progressing toward long term goals.  Continue plan of care.  PT Short Term Goals Week 1:  PT Short Term Goal 1 (Week 1): Patient will perform bed mobility with supervision. PT Short Term Goal 1 - Progress (Week 1): Met PT Short Term Goal 2 (Week 1): Patient will  consistently perform sit <> stand with min A. PT Short Term Goal 2 - Progress (Week 1): Met PT Short Term Goal 3 (Week 1): Patient will ambulate 100 ft using RW with supervision. PT Short Term Goal 3 - Progress (Week 1): Progressing toward goal PT Short Term Goal 4 (Week 1): Patient will negotiate up/down 4 stairs using 2 rails with min A.  PT Short Term Goal 4 - Progress (Week 1): Not met Week 2:  PT Short Term Goal 1 (Week 2): = LTGs due to anticipated LOS  Skilled Therapeutic Interventions/Progress Updates:   Session focused on activity tolerance, ambulation, stairs, standing balance, sit <> stand, and safety. Patient received sitting in wheelchair with laundry to be washed. Patient propelled wheelchair using BUE x 150 ft with supervision, cues for technique. Patient performed multiple sit <> stand transfers with overall min A throughout session and verbal cues for forward weight shift and controlled descent. Patient able to stand 2x with supervision toward end of session for first time. To challenge standing balance, patient used RW to stand at washing machine with alternating UE support to operate machine with min questioning cues. Gait training using RW x 150 ft + 30 ft with min steady assist > close supervision, min verbal cues for forward gaze. Patient negotiated up/down four 6" steps x 2 using 2 rails, able to recall safe step-to sequencing, overall min-mod A with seated rest break between trials. NuStep using BLE only at level 4 x 5 min for BLE strengthening and endurance. Patient left sitting in wheelchair with all needs within reach.    Therapy Documentation Precautions:  Precautions Precautions: Fall Precaution Comments: Will sit without warning when fatigued; bring chair behind  for safety Restrictions Weight Bearing Restrictions: Yes RLE Weight Bearing: Weight bearing as tolerated LLE Weight Bearing: Weight bearing as tolerated Pain: Pain Assessment Pain Assessment: 0-10 Pain  Score: 5  Pain Location: Generalized Pain Orientation: Right;Left Pain Descriptors / Indicators: Aching;Tender;Tightness Pain Onset: On-going Pain Intervention(s): Ambulation/increased activity;Repositioned;Emotional support  See FIM for current functional status  Therapy/Group: Individual Therapy  Laretta Alstrom 09/21/2014, 2:38 PM

## 2014-09-21 NOTE — Progress Notes (Signed)
[facility] Progress Note   Subjective: no complaints   Filed Vitals:   09/20/14 1730 09/20/14 1800 09/21/14 0222 09/21/14 0559  BP: 108/92 143/74 138/68 135/62  Pulse: 117 112 109 105  Temp:  99 F (37.2 C) 99.3 F (37.4 C) 99 F (37.2 C)  TempSrc:  Oral Oral Oral  Resp: 20 18 20 18   Height:      Weight:  84.2 kg (185 lb 10 oz)  87.2 kg (192 lb 3.9 oz)  SpO2:  100% 100% 100%   Exam: Alert, no distress, on HD No jvd Chest clear bilat RRR no MRG Abd soft, NTND, no ascites, mod obese 2-3+ bilat pitting edema of upper and lower legs Neuro is alert, Ox 3  ECHO LV 55%  HD: new start Max inpatient weight 112kg, mim inpt weight 90.5kg        Assessment: 1. 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 Vol excess / LE edema - improving slowly 3. 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 MSSA bacteremia/ septic polyarthritis - on Ancef x 8 wks w HD, TEE was neg 6. Hep C+ 7. DM per primary 8. HTN - on hydralazine, vol excess  Plan - HD tomorrow, no HD today ([facility]). Have ordered IV team to remove L IJ central line.     Kelly Splinter MD  pager [phone]    cell [phone]  09/21/2014, 10:50 AM     Recent Labs Lab 09/15/14 1515 09/18/14 1505 09/20/14 1506 09/21/14 0555  NA 132* 134* 134*  --   K 4.2 3.7 3.9  --   CL 99 99 99  --   CO2 27 29 29   --   GLUCOSE 173* 120* 124*  --   BUN 10 14 11   --   CREATININE 5.24* 4.97* 4.10* 3.30*  CALCIUM 8.3* 8.0* 7.7*  --   PHOS 3.3 2.6 2.3  --     Recent Labs Lab 09/15/14 1515 09/18/14 1505 09/20/14 1506  ALBUMIN 1.6* 1.5* 1.5*    Recent Labs Lab 09/15/14 1500 09/20/14 1506 09/21/14 0249  WBC 12.7* 9.8 8.9  HGB 9.0* 8.6* 8.3*  HCT 28.8* 28.5* 26.8*  MCV 94.1 94.7 95.7  PLT 437* 290 248   . antiseptic oral rinse  7 mL Mouth Rinse q12n4p  . aspirin  81 mg Oral Daily  .  ceFAZolin (ANCEF) IV  2 g Intravenous Q M,W,F-HD  . chlorhexidine  15 mL  Mouth Rinse BID  . cyclobenzaprine  5 mg Oral BID  . darbepoetin (ARANESP) injection - DIALYSIS  200 mcg Intravenous Q Tue-HD  . [START ON 09/23/2014] enoxaparin (LOVENOX) injection  30 mg Subcutaneous Q24H  . famotidine  20 mg Oral Daily  . feeding supplement (NEPRO CARB STEADY)  237 mL Oral TID WC  . ferric gluconate (FERRLECIT/NULECIT) IV  125 mg Intravenous Q M,W,F-HD  . gabapentin  100 mg Oral BID  . insulin aspart  0-9 Units Subcutaneous TID WC  . lanthanum  1,000 mg Oral TID WC  . multivitamin  1 tablet Oral QHS  . nicotine  21 mg Transdermal Daily  . oxyCODONE  10 mg Oral BID WC  . polyethylene glycol  17 g Oral BID  . sodium chloride  10-40 mL Intracatheter Q12H     acetaminophen, ALPRAZolam, bisacodyl, diphenhydrAMINE, gi cocktail, guaiFENesin-dextromethorphan, hydrocerin, magic mouthwash w/lidocaine, methocarbamol, ondansetron (ZOFRAN) IV, oxyCODONE, sodium chloride, sodium  chloride, sorbitol, traZODone, zolpidem

## 2014-09-21 NOTE — Progress Notes (Signed)
[place] [facility]     PROGRESS NOTE    Subjective/Complaints: Had a bad night. Doesn't feel well. Coughed up some blood "clots"   Objective: Vital Signs: Blood pressure 135/62, pulse 105, temperature 99 F (37.2 C), temperature source Oral, resp. rate 18, height 5\' 7"  (1.702 m), weight 87.2 kg (192 lb 3.9 oz), last menstrual period 03/16/2002, SpO2 100 %. Dg Chest 2 View  09/21/2014   CLINICAL DATA:  Cough hemoptysis and low-grade fever, history of diabetes and septic joints, dialysis dependent renal failure.  EXAM: CHEST  2 VIEW  COMPARISON:  PA and lateral chest x-ray of March 8th and September 02, 2014.  FINDINGS: The cardiac silhouette remains enlarged. The pulmonary vascularity is mildly prominent centrally but not greatly changed from the previous study. There is small left pleural effusion and left lower lobe atelectasis. There is a trace right pleural effusion.  IMPRESSION: Findings consistent with mild CHF with left lower lobe atelectasis and small bilateral pleural effusions. Overall the appearance of the chest has improved since the previous studies.   Electronically Signed   By: David  [place]   On: 09/21/2014 08:01    Recent Labs  09/20/14 1506 09/21/14 0249  WBC 9.8 8.9  HGB 8.6* 8.3*  HCT 28.5* 26.8*  PLT 290 248    Recent Labs  09/18/14 1505 09/20/14 1506 09/21/14 0555  NA 134* 134*  --   K 3.7 3.9  --   CL 99 99  --   GLUCOSE 120* 124*  --   BUN 14 11  --   CREATININE 4.97* 4.10* 3.30*  CALCIUM 8.0* 7.7*  --    CBG (last 3)   Recent Labs  09/20/14 1130 09/20/14 2125 09/21/14 0643  GLUCAP 149* 147* 106*    Wt Readings from Last 3 Encounters:  09/21/14 87.2 kg (192 lb 3.9 oz)  09/14/14 92.2 kg (203 lb 4.2 oz)  08/08/14 89.812 kg (198 lb)    Physical Exam:  Constitutional: She is oriented to person, place, and time. She appears well-developed and well-nourished.  HENT:  Head: Normocephalic and atraumatic.  Eyes:  Conjunctivae are normal. Pupils are equal, round, and reactive to light.  Neck: Normal range of motion. Neck supple.  Cardiovascular: Normal rate, regular rhythm and normal heart sounds.  Respiratory: Effort normal and breath sounds normal.  GI: Soft. Bowel sounds are normal.  Musculoskeletal: She exhibits edema (1+ edema L-forearm with less tenderness).  Healing incisions bilateral knees with edema L>R. Left knee with pain on ROM. Moves RUE without difficulty. LUE still tender along AVG site. Mild edema and warmth noted  Neurological: She is alert and oriented to person, place, and time.  Skin: Skin is warm and dry.  Psychiatric: Her speech is normal. Judgment and thought content normal. Her mood appears pleasant, slightly anxious.     Assessment/Plan: 1. Functional deficits secondary to septic joints with subsequent debilitation which require 3+ hours per day of interdisciplinary therapy in a comprehensive inpatient rehab setting. Physiatrist is providing close team supervision and 24 hour management of active medical problems listed below. Physiatrist and rehab team continue to assess barriers to discharge/monitor patient progress toward functional and medical goals. FIM: FIM - Bathing Bathing Steps Patient Completed: Chest, Right Arm, Left Arm, Abdomen, Front perineal area, Right upper leg, Left upper leg, Buttocks Bathing: 4: Min-Patient completes 8-9 103f 10 parts or 75+ percent  FIM - Upper Body Dressing/Undressing Upper body dressing/undressing steps patient completed: Thread/unthread left sleeve of  pullover shirt/dress, Thread/unthread right sleeve of pullover shirt/dresss, Put head through opening of pull over shirt/dress, Pull shirt over trunk Upper body dressing/undressing: 5: Set-up assist to: Obtain clothing/put away FIM - Lower Body Dressing/Undressing Lower body dressing/undressing steps patient completed: Thread/unthread right underwear leg, Pull underwear up/down,  Thread/unthread right pants leg, Pull pants up/down, Fasten/unfasten pants, Don/Doff left shoe, Thread/unthread left pants leg, Thread/unthread left underwear leg Lower body dressing/undressing: 4: Min-Patient completed 75 plus % of tasks  FIM - Toileting Toileting: 1: Two helpers (per [employer], NT)  FIM - Toilet Transfers Toilet Transfers: 1-Two helpers  FIM - Control and instrumentation engineer Devices: Environmental consultant, Arm rests Bed/Chair Transfer: 4: Bed > Chair or W/C: Min A (steadying Pt. > 75%)  FIM - Locomotion: Wheelchair Distance: 150 Locomotion: Wheelchair: 5: Travels 150 ft or more: maneuvers on rugs and over door sills with supervision, cueing or coaxing FIM - Locomotion: Ambulation Locomotion: Ambulation Assistive Devices: Administrator Ambulation/Gait Assistance: 4: Min assist Locomotion: Ambulation: 2: Travels 50 - 149 ft with minimal assistance (Pt.>75%)  Comprehension Comprehension Mode: Auditory Comprehension: 5-Understands complex 90% of the time/Cues < 10% of the time  Expression Expression Mode: Verbal Expression: 4-Expresses basic 75 - 89% of the time/requires cueing 10 - 24% of the time. Needs helper to occlude trach/needs to repeat words.  Social Interaction Social Interaction: 6-Interacts appropriately with others with medication or extra time (anti-anxiety, antidepressant).  Problem Solving Problem Solving Mode: Asleep Problem Solving: 4-Solves basic 75 - 89% of the time/requires cueing 10 - 24% of the time  Memory Memory: 6-More than reasonable amt of time  Medical Problem List and Plan: 1. 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 DVT Prophylaxis/Anticoagulation: Pharmaceutical: Lovenox (hold today) 3. Pain Management:   oxycodone 10 mg prn.   -anxiety mgt 4. Adjustment reaction/ Mood: Team to provide ego support. Patient not interested in antidepressant at this time.  LCSW to follow for evaluation and support.  5. Neuropsych: This patient is capable of making decisions on her own behalf. 6. Skin/Wound Care: Routine pressure relief measures. Elevate LUE when in bed  l 7. Fluids/Electrolytes/Nutrition: Needs 1200 FR due to ESRD.  8. ESRD: Schedule HD post therapy. May need daily HD 9. Septic arthritis:  continue Ancef  thru 10/04/14 with HD  -low grade temp  -cxr with chf, atelectasis----IS   -volume mgt per renal   -hold lovenox given blood tinged/clotted sputum (h/h ok) 10. DM type 2 with neuropathy: Resumed gabapentin as complaining of significant neuropathic symptoms BLE. Continue to monitor BS ac/hs basis and use SSI for elevated BS. Off glipizide and currently diet controlled.   -sugars under control 11. HTN: Monitor every 8 hours. Continue Hydralazine bid.   LOS (Days) 7 A FACE TO FACE EVALUATION WAS PERFORMED  Colin Norment T 09/21/2014 8:27 AM

## 2014-09-22 ENCOUNTER — Inpatient Hospital Stay (HOSPITAL_COMMUNITY): Payer: Medicaid Other

## 2014-09-22 ENCOUNTER — Inpatient Hospital Stay (HOSPITAL_COMMUNITY): Payer: Medicaid Other | Admitting: Physical Therapy

## 2014-09-22 LAB — GLUCOSE, CAPILLARY
Glucose-Capillary: 105 mg/dL — ABNORMAL HIGH (ref 70–99)
Glucose-Capillary: 138 mg/dL — ABNORMAL HIGH (ref 70–99)
Glucose-Capillary: 131 mg/dL — ABNORMAL HIGH (ref 70–99)
Glucose-Capillary: 117 mg/dL — ABNORMAL HIGH (ref 70–99)

## 2014-09-22 MED ORDER — ALPRAZOLAM 0.5 MG PO TABS
ORAL_TABLET | ORAL | Status: AC
  Filled 2014-09-22: qty 1

## 2014-09-22 NOTE — Plan of Care (Signed)
Problem: RH PAIN MANAGEMENT Goal: RH STG PAIN MANAGED AT OR BELOW PT'S PAIN GOAL <2  Outcome: Not Progressing Pt rates pain as 3

## 2014-09-22 NOTE — Progress Notes (Signed)
[facility] PHYSICAL MEDICINE & REHABILITATION     PROGRESS NOTE    Subjective/Complaints: Feeling better today. Pain improving. No coughing. Ready for therapies today  Objective: Vital Signs: Blood pressure 142/65, pulse 102, temperature 98.3 F (36.8 C), temperature source Oral, resp. rate 18, height 5\' 7"  (1.702 m), weight 88.2 kg (194 lb 7.1 oz), last menstrual period 03/16/2002, SpO2 100 %. Dg Chest 2 View  09/21/2014   CLINICAL DATA:  Cough hemoptysis and low-grade fever, history of diabetes and septic joints, dialysis dependent renal failure.  EXAM: CHEST  2 VIEW  COMPARISON:  PA and lateral chest x-ray of March 8th and September 02, 2014.  FINDINGS: The cardiac silhouette remains enlarged. The pulmonary vascularity is mildly prominent centrally but not greatly changed from the previous study. There is small left pleural effusion and left lower lobe atelectasis. There is a trace right pleural effusion.  IMPRESSION: Findings consistent with mild CHF with left lower lobe atelectasis and small bilateral pleural effusions. Overall the appearance of the chest has improved since the previous studies.   Electronically Signed   By: David  Martinique   On: 09/21/2014 08:01    Recent Labs  09/20/14 1506 09/21/14 0249  WBC 9.8 8.9  HGB 8.6* 8.3*  HCT 28.5* 26.8*  PLT 290 248    Recent Labs  09/20/14 1506 09/21/14 0555  NA 134*  --   K 3.9  --   CL 99  --   GLUCOSE 124*  --   BUN 11  --   CREATININE 4.10* 3.30*  CALCIUM 7.7*  --    CBG (last 3)   Recent Labs  09/21/14 1742 09/21/14 2128 09/22/14 0729  GLUCAP 134* 128* 105*    Wt Readings from Last 3 Encounters:  09/22/14 88.2 kg (194 lb 7.1 oz)  09/14/14 92.2 kg (203 lb 4.2 oz)  08/08/14 89.812 kg (198 lb)    Physical Exam:  Constitutional: She is oriented to person, place, and time. She appears well-developed and well-nourished.  HENT:  Head: Normocephalic and atraumatic.  Eyes: Conjunctivae are normal. Pupils are equal,  round, and reactive to light.  Neck: Normal range of motion. Neck supple.  Cardiovascular: Normal rate, regular rhythm and normal heart sounds.  Respiratory: Effort normal and breath sounds normal.  GI: Soft. Bowel sounds are normal.  Musculoskeletal: She exhibits edema (1+ edema L-forearm with less tenderness).  Healing incisions bilateral knees with edema L>R. Left knee pain improving. Moves RUE without difficulty. LUE still tender along AVG site. Mild edema and warmth noted  Neurological: She is alert and oriented to person, place, and time.  Skin: Skin is warm and dry.  Psychiatric: Her speech is normal. Judgment and thought content normal. Her mood appears pleasant, slightly anxious.     Assessment/Plan: 1. Functional deficits secondary to septic joints with subsequent debilitation which require 3+ hours per day of interdisciplinary therapy in a comprehensive inpatient rehab setting. Physiatrist is providing close team supervision and 24 hour management of active medical problems listed below. Physiatrist and rehab team continue to assess barriers to discharge/monitor patient progress toward functional and medical goals. FIM: FIM - Bathing Bathing Steps Patient Completed: Chest, Right Arm, Left Arm, Abdomen, Front perineal area, Right upper leg, Left upper leg, Buttocks, Left lower leg (including foot), Right lower leg (including foot) Bathing: 4: Steadying assist  FIM - Upper Body Dressing/Undressing Upper body dressing/undressing steps patient completed: Thread/unthread left sleeve of pullover shirt/dress, Thread/unthread right sleeve of pullover shirt/dresss, Put head  through opening of pull over shirt/dress, Pull shirt over trunk Upper body dressing/undressing: 5: Set-up assist to: Obtain clothing/put away FIM - Lower Body Dressing/Undressing Lower body dressing/undressing steps patient completed: Thread/unthread right underwear leg, Pull underwear up/down, Thread/unthread  right pants leg, Pull pants up/down, Fasten/unfasten pants, Thread/unthread left pants leg, Thread/unthread left underwear leg, Don/Doff right sock, Don/Doff left sock Lower body dressing/undressing: 4: Steadying Assist  FIM - Toileting Toileting: 1: Two helpers (per [employer], NT)  FIM - Air cabin crew Transfers: 1-Two helpers  FIM - Control and instrumentation engineer Devices: Environmental consultant, Arm rests Bed/Chair Transfer: 4: Bed > Chair or W/C: Min A (steadying Pt. > 75%), 4: Chair or W/C > Bed: Min A (steadying Pt. > 75%)  FIM - Locomotion: Wheelchair Distance: 150 Locomotion: Wheelchair: 5: Travels 150 ft or more: maneuvers on rugs and over door sills with supervision, cueing or coaxing FIM - Locomotion: Ambulation Locomotion: Ambulation Assistive Devices: Administrator Ambulation/Gait Assistance: 4: Min assist Locomotion: Ambulation: 4: Travels 150 ft or more with minimal assistance (Pt.>75%)  Comprehension Comprehension Mode: Auditory Comprehension: 5-Understands basic 90% of the time/requires cueing < 10% of the time  Expression Expression Mode: Verbal Expression: 4-Expresses basic 75 - 89% of the time/requires cueing 10 - 24% of the time. Needs helper to occlude trach/needs to repeat words.  Social Interaction Social Interaction: 6-Interacts appropriately with others with medication or extra time (anti-anxiety, antidepressant).  Problem Solving Problem Solving Mode: Asleep Problem Solving: 4-Solves basic 75 - 89% of the time/requires cueing 10 - 24% of the time  Memory Memory: 6-More than reasonable amt of time  Medical Problem List and Plan: 1. 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 DVT Prophylaxis/Anticoagulation: Pharmaceutical: Lovenox (hold today) 3. Pain Management:   oxycodone 10 mg prn.   -anxiety mgt  -gabapentin for neuropathic pain 4. Adjustment reaction/ Mood: Team to  provide ego support. Patient not interested in antidepressant at this time. LCSW to follow for evaluation and support.  5. Neuropsych: This patient is capable of making decisions on her own behalf. 6. Skin/Wound Care: Routine pressure relief measures. Elevate LUE when in bed  l 7. Fluids/Electrolytes/Nutrition: Needs 1200 FR due to ESRD.  8. ESRD: Schedule HD post therapy. May need daily HD 9. Septic arthritis:  continue Ancef  thru 10/04/14 with HD  -cxr with chf, atelectasis----IS   -volume mgt per renal   -held lovenox given blood tinged/clotted sputum (h/h ok) 10. DM type 2 with neuropathy: Continue to monitor BS ac/hs basis and use SSI for elevated BS. Off glipizide and currently diet controlled.   -sugars under control 11. HTN: Monitor every 8 hours. Continue Hydralazine bid.   LOS (Days) 8 A FACE TO FACE EVALUATION WAS PERFORMED  Kieth Hartis T 09/22/2014 7:39 AM

## 2014-09-22 NOTE — Progress Notes (Signed)
Occupational Therapy Session Note  Patient Details  Name: Brandy Houston MRN: [id number] Date of Birth: July 23, 1955  Today's Date: 09/22/2014 OT Individual Time: 0800-0900 and 1300-1409 OT Individual Time Calculation (min): 60 min and 69 min     Short Term Goals: Week 1:  OT Short Term Goal 1 (Week 1): Pt. will bathe self with supervision OT Short Term Goal 1 - Progress (Week 1): Progressing toward goal OT Short Term Goal 2 (Week 1): Pt. will dress UB with supervision OT Short Term Goal 2 - Progress (Week 1): Met OT Short Term Goal 3 (Week 1): Pt will dress LB with minimal assist OT Short Term Goal 3 - Progress (Week 1): Met OT Short Term Goal 4 (Week 1): Pt. will go from sit to stand with min assist OT Short Term Goal 4 - Progress (Week 1): Met OT Short Term Goal 5 (Week 1): Pt. will transfer to toilet with minimal assist OT Short Term Goal 5 - Progress (Week 1): Met  Skilled Therapeutic Interventions/Progress Updates:    Session 1: Pt seen for ADL retraining with focus on functional mobility, standing balance, activity tolerance, and safety awareness. Pt received supine in bed. Pt ambulated bed>walk-in shower with CGA using RW. Completed bathing at shower level using lateral lean technique for buttocks hygiene. Ambulated back to room with min A sit>stand and CGA-SBA for functional ambulation with RW to w/c. Pt completed sit<>stand several times during remainder of session at SBA level with min cues for anterior weight shift and relying less on momentum. Pt with improved standing balance during clothing management around waist with recall of alternating UE support during task. Pt stood for 2 min at sink to complete grooming tasks at SBA level. At end of session pt left sitting in w/c with all needs in reach.   Session 2: Pt seen for 1:1 OT session with focus on functional transfers, standing balance, activity tolerance, and BUE strengthening. Pt received sitting in w/c. Propelled self to ADL  apartment to increase BUE strength. Practiced simulated walk-in shower transfer with 3" ledge and shower chair. Pt reporting she has chair at home that she can place in shower. Practiced transfer 3x with pt initially requiring min A and max cues for safety and sequencing. Pt progressed to completing with min-SBA and min cues. Pt retrieved grapes from refrigerator from w/c level then stood at sink for approx 5 min at SBA level to wash grapes and place in container. Pt propelled self to family room with 1 rest break to increase BUE strength and activity tolerance. Pt picked magazine of choice from w/c level with focus on anterior weight shift in preparation for sit<>stand. Pt stood at hi-lo table at SBA level while engaging in game of checkers. Pt stood for a total of 8 min before requiring rest breaks. Pt returned to room and changed into hospital gown then transferred to bed at min A due to fatigue. Pt left supine in bed with all needs in reach.   Therapy Documentation Precautions:  Precautions Precautions: Fall Precaution Comments: Will sit without warning when fatigued; bring chair behind for safety Restrictions Weight Bearing Restrictions: Yes RLE Weight Bearing: Weight bearing as tolerated LLE Weight Bearing: Weight bearing as tolerated General:   Vital Signs:  Pain: Pain Assessment Pain Assessment: 0-10 Pain Score: 3  Pain Type: Acute pain Pain Location: Generalized Pain Orientation: Right;Left Pain Intervention(s): Ambulation/increased activity;Repositioned  See FIM for current functional status  Therapy/Group: Individual Therapy  Duayne Cal 09/22/2014, 12:14  PM

## 2014-09-22 NOTE — Progress Notes (Signed)
Physical Therapy Session Note  Patient Details  Name: Brandy Houston MRN: [id number] Date of Birth: 01-Dec-1955  Today's Date: 09/22/2014 PT Individual Time: 1000-1100 PT Individual Time Calculation (min): 60 min   Short Term Goals: Week 2:  PT Short Term Goal 1 (Week 2): = LTGs due to anticipated LOS  Skilled Therapeutic Interventions/Progress Updates:   Session focused on sit <> stand transfers, ambulation, stairs, NMR, and activity tolerance. Patient received sitting in wheelchair. Performed sit <> stand using RW with supervision consistently. Gait training using RW room <> therapy gym 2 x 150 ft with supervision, mod verbal cues for upright posture. Patient performed sit <> stand from 22" mat x 10 with supervision, verbal cues for safe hand placement, hip extension, forward head, and upright posture. Patient negotiated up/down curb step using RW x 4 with min guard and max verbal cues for sequencing and safety, patient able to recall step-to pattern. Patient negotiated up/down four 6" stairs using 2 rails x 2 with supervision, no cues needed and seated rest break between trials. Patient performed NuStep using BUE/BLE at level 3 x 6 min for ROM/strengthening. Patient left sitting in wheelchair with all needs within reach.    Therapy Documentation Precautions:  Precautions Precautions: Fall Precaution Comments: Will sit without warning when fatigued; bring chair behind for safety Restrictions Weight Bearing Restrictions: Yes RLE Weight Bearing: Weight bearing as tolerated LLE Weight Bearing: Weight bearing as tolerated Pain: Pain Assessment Pain Assessment: 0-10 Pain Score: 3  Pain Type: Acute pain Pain Location: Generalized Pain Orientation: Right;Left Pain Intervention(s): Ambulation/increased activity;Repositioned  See FIM for current functional status  Therapy/Group: Individual Therapy  Laretta Alstrom 09/22/2014, 10:59 AM

## 2014-09-23 ENCOUNTER — Inpatient Hospital Stay (HOSPITAL_COMMUNITY): Payer: Medicaid Other | Admitting: Physical Therapy

## 2014-09-23 LAB — GLUCOSE, CAPILLARY
Glucose-Capillary: 106 mg/dL — ABNORMAL HIGH (ref 70–99)
Glucose-Capillary: 140 mg/dL — ABNORMAL HIGH (ref 70–99)
Glucose-Capillary: 130 mg/dL — ABNORMAL HIGH (ref 70–99)

## 2014-09-23 NOTE — Progress Notes (Signed)
Physical Therapy Session Note  Patient Details  Name: Brandy Houston MRN: [id number] Date of Birth: 04-17-1956  Today's Date: 09/23/2014 PT Individual Time: 0900-0930 PT Individual Time Calculation (min): 30 min   Short Term Goals: Week 1:  PT Short Term Goal 1 (Week 1): Patient will perform bed mobility with supervision. PT Short Term Goal 1 - Progress (Week 1): Met PT Short Term Goal 2 (Week 1): Patient will consistently perform sit <> stand with min A. PT Short Term Goal 2 - Progress (Week 1): Met PT Short Term Goal 3 (Week 1): Patient will ambulate 100 ft using RW with supervision. PT Short Term Goal 3 - Progress (Week 1): Progressing toward goal PT Short Term Goal 4 (Week 1): Patient will negotiate up/down 4 stairs using 2 rails with min A.  PT Short Term Goal 4 - Progress (Week 1): Not met  Skilled Therapeutic Interventions/Progress Updates:  Pt was seen bedside in the am. Pt transferred supine to edge of bed with side rail and S. Pt transferred sit to stand from edge of bed with rolling walker and S. Pt ambulated into bathroom with rolling walker and S. Pt performed toilet transfers with S and occasional verbal cues. Pt ambulated to sink with rolling walker and S with occasional verbal cues. Pt tolerated standing at sink while washing with wash cloth. Pt sat in w/c, required assistance to put on TED hose. Pt transferred sit to stand from w/c with rolling walker and S, required assistance to pull up pants and returned to w/c. Pt ambulated with rolling walker and S about 200 feet with slow cadence. Pt returned to edge of bed. Pt transferred edge of bed to recliner with rolling walker and S. Pt left sitting up in recliner with call bell within reach.   Therapy Documentation Precautions:  Precautions Precautions: Fall Precaution Comments: Will sit without warning when fatigued; bring chair behind for safety Restrictions Weight Bearing Restrictions: Yes RLE Weight Bearing: Weight bearing as  tolerated LLE Weight Bearing: Weight bearing as tolerated General:   Pain: Mild c/o pain B knees.   Locomotion : Ambulation Ambulation/Gait Assistance: 5: Supervision   See FIM for current functional status  Therapy/Group: Individual Therapy  Dub Amis 09/23/2014, 12:55 PM

## 2014-09-23 NOTE — Progress Notes (Signed)
ANTIBIOTIC CONSULT NOTE - FOLLOW UP  Pharmacy Consult for Ancef Indication: disseminated MSSA infection  Allergies  Allergen Reactions  . Compazine [Prochlorperazine] Shortness Of Breath and Swelling    TONGUE SWELLS  . Omnipaque [Iohexol] Hives  . Shellfish-Derived Products Anaphylaxis  . Iodinated Diagnostic Agents Rash    Patient Measurements: Height: 5\' 7"  (170.2 cm) Weight: 183 lb 6.8 oz (83.2 kg) IBW/kg (Calculated) : 61.6  Vital Signs: Temp: 99.1 F (37.3 C) (04/09 0600) Temp Source: Oral (04/09 0600) BP: 119/63 mmHg (04/09 0600) Pulse Rate: 97 (04/09 0600) Intake/Output from previous day: 04/08 0701 - 04/09 0700 In: 600 [P.O.:600] Out: 3650  Intake/Output from this shift: Total I/O In: 240 [P.O.:240] Out: -   Labs:  Recent Labs  09/20/14 1506 09/21/14 0249 09/21/14 0555  WBC 9.8 8.9  --   HGB 8.6* 8.3*  --   PLT 290 248  --   CREATININE 4.10*  --  3.30*   Estimated Creatinine Clearance: 20.3 mL/min (by C-G formula based on Cr of 3.3). No results for input(s): VANCOTROUGH, VANCOPEAK, VANCORANDOM, GENTTROUGH, GENTPEAK, GENTRANDOM, TOBRATROUGH, TOBRAPEAK, TOBRARND, AMIKACINPEAK, AMIKACINTROU, AMIKACIN in the last 72 hours.   Microbiology: Recent Results (from the past 720 hour(s))  Culture, Urine     Status: None   Collection Time: 09/02/14  3:04 PM  Result Value Ref Range Status   Specimen Description URINE, CATHETERIZED  Final   Special Requests NONE  Final   Colony Count   Final    >=100,000 COLONIES/ML Performed at [facility]    Culture   Final    PSEUDOMONAS AERUGINOSA Performed at [facility]    Report Status 09/05/2014 FINAL  Final   Organism ID, Bacteria PSEUDOMONAS AERUGINOSA  Final      Susceptibility   Pseudomonas aeruginosa - MIC*    CEFEPIME 2 SENSITIVE Sensitive     CEFTAZIDIME 4 SENSITIVE Sensitive     CIPROFLOXACIN 0.5 SENSITIVE Sensitive     GENTAMICIN <=1 SENSITIVE Sensitive     IMIPENEM >=16 RESISTANT  Resistant     PIP/TAZO 8 SENSITIVE Sensitive     TOBRAMYCIN <=1 SENSITIVE Sensitive     * PSEUDOMONAS AERUGINOSA  Surgical pcr screen     Status: None   Collection Time: 09/13/14  1:12 AM  Result Value Ref Range Status   MRSA, PCR NEGATIVE NEGATIVE Final   Staphylococcus aureus NEGATIVE NEGATIVE Final    Comment:        The Xpert SA Assay (FDA approved for NASAL specimens in patients over 71 years of age), is one component of a comprehensive surveillance program.  Test performance has been validated by [facility] for patients greater than or equal to 4 year old. It is not intended to diagnose infection nor to guide or monitor treatment.      Assessment: 59 yoF admitted with intractable shoulder pain, 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 Patient continues on Ancef, planning for 8 weeks of therapy (end date 4/21). Patient with ARF, started on HD this admission.  Now deemed ESRD, s/p AVG creation on 3/30 for long-term HD scheduled MWF. Ancef being given M/W/F with HD. Tm 99.1, WBC up to 12.7 on 4/1.  Ancef charted as given on Monday 4/1, pt got 2.5 hrs HD on Tues 4/5 and 3 hr HD Wed 4/6 and NO ancef doses charted.  Pt got ancef after HD on Friday 4/7.  Goal of Therapy:  Eradication of infection  Plan:  -Ancef 2g IV qMWF. -Monitor clinical  progress -f/u to make sure pt gets doses after HD on HD days  Eudelia Bunch, Pharm.D. [id number] 09/23/2014 1:01 PM

## 2014-09-23 NOTE — Progress Notes (Signed)
PHYSICAL MEDICINE & REHABILITATION     PROGRESS NOTE    Subjective/Complaints: Feeling better today. Pain improving. No coughing. Ready for therapies today  Objective: Vital Signs: Blood pressure 119/63, pulse 97, temperature 99.1 F (37.3 C), temperature source Oral, resp. rate 18, height 5\' 7"  (1.702 m), weight 83.2 kg (183 lb 6.8 oz), last menstrual period 03/16/2002, SpO2 99 %. No results found.  Recent Labs  09/20/14 1506 09/21/14 0249  WBC 9.8 8.9  HGB 8.6* 8.3*  HCT 28.5* 26.8*  PLT 290 248    Recent Labs  09/20/14 1506 09/21/14 0555  NA 134*  --   K 3.9  --   CL 99  --   GLUCOSE 124*  --   BUN 11  --   CREATININE 4.10* 3.30*  CALCIUM 7.7*  --    CBG (last 3)   Recent Labs  09/22/14 1138 09/22/14 1854 09/22/14 2106  GLUCAP 138* 131* 117*    Wt Readings from Last 3 Encounters:  09/23/14 83.2 kg (183 lb 6.8 oz)  09/14/14 92.2 kg (203 lb 4.2 oz)  08/08/14 89.812 kg (198 lb)    Physical Exam:  Constitutional: She is oriented to person, place, and time. She appears well-developed and well-nourished.  HENT:  Head: Normocephalic and atraumatic.  Eyes: Conjunctivae are normal. Pupils are equal, round, and reactive to light.  Neck: Normal range of motion. Neck supple.  Cardiovascular: Normal rate, regular rhythm and normal heart sounds.  Respiratory: Effort normal and breath sounds normal.  GI: Soft. Bowel sounds are normal.  Musculoskeletal: She exhibits edema (1+ edema L-forearm with less tenderness). , pain with left knee ROM Healing incisions bilateral knees with edema L>R. Left knee pain improving. Moves RUE without difficulty. LUE still tender along AVG site. Mild edema and warmth noted , good thrill Neurological: She is alert and oriented to person, place, and time.  Skin: Skin is warm and dry.  Psychiatric: Her speech is normal. Judgment and thought content normal. Her mood appears pleasant, slightly anxious.      Assessment/Plan: 1. Functional deficits secondary to septic joints with subsequent debilitation which require 3+ hours per day of interdisciplinary therapy in a comprehensive inpatient rehab setting. Physiatrist is providing close team supervision and 24 hour management of active medical problems listed below. Physiatrist and rehab team continue to assess barriers to discharge/monitor patient progress toward functional and medical goals. FIM: FIM - Bathing Bathing Steps Patient Completed: Chest, Right Arm, Left Arm, Abdomen, Front perineal area, Right upper leg, Left upper leg, Buttocks, Left lower leg (including foot), Right lower leg (including foot) Bathing: 5: Supervision: Safety issues/verbal cues  FIM - Upper Body Dressing/Undressing Upper body dressing/undressing steps patient completed: Thread/unthread left sleeve of pullover shirt/dress, Thread/unthread right sleeve of pullover shirt/dresss, Put head through opening of pull over shirt/dress, Pull shirt over trunk Upper body dressing/undressing: 6: More than reasonable amount of time FIM - Lower Body Dressing/Undressing Lower body dressing/undressing steps patient completed: Thread/unthread right underwear leg, Pull underwear up/down, Thread/unthread right pants leg, Pull pants up/down, Fasten/unfasten pants, Thread/unthread left pants leg, Thread/unthread left underwear leg, Don/Doff left shoe, Don/Doff right shoe, Fasten/unfasten right shoe, Fasten/unfasten left shoe Lower body dressing/undressing: 5: Set-up assist to: Don/Doff TED stocking  FIM - Toileting Toileting: 1: Two helpers (per [employer], NT)  FIM - Air cabin crew Transfers: 1-Two helpers  FIM - Control and instrumentation engineer Devices: Environmental consultant, Arm rests Bed/Chair Transfer: 5: Chair or W/C > Bed: Supervision (verbal cues/safety  issues), 5: Bed > Chair or W/C: Supervision (verbal cues/safety issues)  FIM - Locomotion:  Wheelchair Distance: 150 Locomotion: Wheelchair: 0: Activity did not occur FIM - Locomotion: Ambulation Locomotion: Ambulation Assistive Devices: Administrator Ambulation/Gait Assistance: 5: Supervision Locomotion: Ambulation: 5: Travels 150 ft or more with supervision/safety issues  Comprehension Comprehension Mode: Auditory Comprehension: 5-Understands complex 90% of the time/Cues < 10% of the time  Expression Expression Mode: Verbal Expression: 5-Expresses basic needs/ideas: With no assist  Social Interaction Social Interaction: 6-Interacts appropriately with others with medication or extra time (anti-anxiety, antidepressant).  Problem Solving Problem Solving Mode: Asleep Problem Solving: 5-Solves basic 90% of the time/requires cueing < 10% of the time  Memory Memory: 5-Recognizes or recalls 90% of the time/requires cueing < 10% of the time  Medical Problem List and Plan: 1. 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 DVT Prophylaxis/Anticoagulation: Pharmaceutical: Lovenox (hold today) 3. Pain Management:   oxycodone 10 mg prn.   -anxiety mgt  -gabapentin for neuropathic pain 4. Adjustment reaction/ Mood: Team to provide ego support. Patient not interested in antidepressant at this time. LCSW to follow for evaluation and support.  5. Neuropsych: This patient is capable of making decisions on her own behalf. 6. Skin/Wound Care: Routine pressure relief measures. Elevate LUE when in bed  l 7. Fluids/Electrolytes/Nutrition: Needs 1200 FR due to ESRD.  8. ESRD: Schedule HD post therapy. May need daily HD 9. Septic arthritis:  continue Ancef  thru 10/04/14 with HD  -cxr with chf, atelectasis----IS   -volume mgt per renal   -held lovenox given blood tinged/clotted sputum (h/h ok) 10. DM type 2 with neuropathy: Continue to monitor BS ac/hs basis and use SSI for elevated BS. Off glipizide and currently diet  controlled.   -sugars under control 11. HTN: Monitor every 8 hours. Continue Hydralazine bid.   LOS (Days) 9 A FACE TO FACE EVALUATION WAS PERFORMED  Emma Schupp E 09/23/2014 7:08 AM

## 2014-09-23 NOTE — Progress Notes (Signed)
  [facility] Progress Note   Subjective: no complaints   Filed Vitals:   09/22/14 1730 09/22/14 1800 09/22/14 1810 09/23/14 0600  BP: 158/77 163/94 157/90 119/63  Pulse: 115 113 116 97  Temp:   98.4 F (36.9 C) 99.1 F (37.3 C)  TempSrc:   Oral Oral  Resp:   20 18  Height:      Weight:   82 kg (180 lb 12.4 oz) 83.2 kg (183 lb 6.8 oz)  SpO2:   96% 99%   Exam: Alert, no distress, on HD No jvd Chest clear bilat RRR no MRG Abd soft, NTND, no ascites, mod obese 2+ bilat pitting edema of upper and lower legs Neuro is alert, Ox 3  ECHO LV 55%  HD: new start Max inpatient weight 112kg, mim inpt weight 90.5kg        Assessment: 1. 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 Vol excess / LE edema - improving slowly, down to 83kg from 93 admit. Has plenty of fluid left 3. 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 MSSA bacteremia/ septic polyarthritis - on Ancef x 8 wks w HD, TEE was neg 6. Hep C+ 7. DM per primary 8. HTN - on hydralazine, vol excess  Plan - HD again today for volume     Kelly Splinter MD  pager [phone]    cell [phone]  09/23/2014, 10:01 AM     Recent Labs Lab 09/18/14 1505 09/20/14 1506 09/21/14 0555  NA 134* 134*  --   K 3.7 3.9  --   CL 99 99  --   CO2 29 29  --   GLUCOSE 120* 124*  --   BUN 14 11  --   CREATININE 4.97* 4.10* 3.30*  CALCIUM 8.0* 7.7*  --   PHOS 2.6 2.3  --     Recent Labs Lab 09/18/14 1505 09/20/14 1506  ALBUMIN 1.5* 1.5*    Recent Labs Lab 09/20/14 1506 09/21/14 0249  WBC 9.8 8.9  HGB 8.6* 8.3*  HCT 28.5* 26.8*  MCV 94.7 95.7  PLT 290 248   . antiseptic oral rinse  7 mL Mouth Rinse q12n4p  . aspirin  81 mg Oral Daily  .  ceFAZolin (ANCEF) IV  2 g Intravenous Q M,W,F-HD  . chlorhexidine  15 mL Mouth Rinse BID  . cyclobenzaprine  5 mg Oral BID  . darbepoetin (ARANESP) injection - DIALYSIS  200 mcg Intravenous Q Tue-HD  . enoxaparin (LOVENOX) injection   30 mg Subcutaneous Q24H  . famotidine  20 mg Oral Daily  . feeding supplement (NEPRO CARB STEADY)  237 mL Oral TID WC  . gabapentin  100 mg Oral BID  . insulin aspart  0-9 Units Subcutaneous TID WC  . lanthanum  1,000 mg Oral TID WC  . multivitamin  1 tablet Oral QHS  . nicotine  21 mg Transdermal Daily  . oxyCODONE  10 mg Oral BID WC  . polyethylene glycol  17 g Oral BID  . sodium chloride  10-40 mL Intracatheter Q12H     acetaminophen, ALPRAZolam, bisacodyl, diphenhydrAMINE, gi cocktail, guaiFENesin-dextromethorphan, hydrocerin, magic mouthwash w/lidocaine, methocarbamol, ondansetron (ZOFRAN) IV, oxyCODONE, sodium chloride, sodium chloride, sorbitol, traZODone, zolpidem

## 2014-09-24 ENCOUNTER — Inpatient Hospital Stay (HOSPITAL_COMMUNITY): Payer: Self-pay

## 2014-09-24 LAB — GLUCOSE, CAPILLARY
Glucose-Capillary: 119 mg/dL — ABNORMAL HIGH (ref 70–99)
Glucose-Capillary: 120 mg/dL — ABNORMAL HIGH (ref 70–99)
Glucose-Capillary: 149 mg/dL — ABNORMAL HIGH (ref 70–99)
Glucose-Capillary: 113 mg/dL — ABNORMAL HIGH (ref 70–99)
Glucose-Capillary: 153 mg/dL — ABNORMAL HIGH (ref 70–99)

## 2014-09-24 NOTE — Progress Notes (Signed)
Physical Therapy Session Note  Patient Details  Name: Rikita Tippie MRN: [id number] Date of Birth: [birth year]  Today's Date: 09/24/2014 PT Individual Time: 1515-1600 PT Individual Time Calculation (min): 45 min   Short Term Goals: Week 2:  PT Short Term Goal 1 (Week 2): = LTGs due to anticipated LOS  Skilled Therapeutic Interventions/Progress Updates:    Pt received supine in bed, agreeable to participate in therapy. Session focused on functional endurance, stair negotiation. Pt ambulated around rehab unit >200' w/ RW and supervision, with min cueing for rest breaks and finding furniture to sit on. Pt negotiated 12 stairs w/ close S and 2 rails. Pt completed 7' on Nustep LE only for bilateral LE strengthening and endurance. Pt tolerated session well. Session ended in pt's room, where pt was left seated in w/c w/ all needs within reach.    Therapy Documentation Precautions:  Precautions Precautions: Fall Precaution Comments: Will sit without warning when fatigued; bring chair behind for safety Restrictions Weight Bearing Restrictions: Yes RLE Weight Bearing: Weight bearing as tolerated LLE Weight Bearing: Weight bearing as tolerated Pain: No/denies pain  See FIM for current functional status  Therapy/Group: Individual Therapy  Rada Hay  Rada Hay, PT, DPT 09/24/2014, 7:32 AM

## 2014-09-24 NOTE — Progress Notes (Addendum)
  [facility] Progress Note   Subjective: no complaints  Filed Vitals:   09/22/14 1810 09/23/14 0600 09/23/14 1441 09/24/14 0557  BP: 157/90 119/63 132/68 145/66  Pulse: 116 97 103 104  Temp: 98.4 F (36.9 C) 99.1 F (37.3 C) 98.7 F (37.1 C) 98.7 F (37.1 C)  TempSrc: Oral Oral Oral Oral  Resp: 20 18 18 17   Height:      Weight: 82 kg (180 lb 12.4 oz) 83.2 kg (183 lb 6.8 oz)  82.9 kg (182 lb 12.2 oz)  SpO2: 96% 99% 92% 94%   Exam: Alert, no distress No jvd Chest clear bilat RRR no MRG Abd soft, NTND, no ascites, mod obese 2+ persistent bilat pitting edema of upper and lower legs Neuro is alert, Ox 3  ECHO LV 55%  HD: new start, admit wt 94kg        Assessment: 1. 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 MSSA bacteremia/ septic polyarthritis - on Ancef x 8 wks w HD, TEE was neg 3. Vol excess / LE edema - improving slowly 4. 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 Hep C+ 7. DM per primary 8. HTN - no meds  Plan - HD Monday (4h, 4-5kg, hep 3000 x 2 prn), check labs, do extra HD as needed while here until vol excess resolved. Start CLIP process tomorrow if not done yet    Kelly Splinter MD  pager [phone]    cell [phone]  09/24/2014, 10:40 AM     Recent Labs Lab 09/18/14 1505 09/20/14 1506 09/21/14 0555  NA 134* 134*  --   K 3.7 3.9  --   CL 99 99  --   CO2 29 29  --   GLUCOSE 120* 124*  --   BUN 14 11  --   CREATININE 4.97* 4.10* 3.30*  CALCIUM 8.0* 7.7*  --   PHOS 2.6 2.3  --     Recent Labs Lab 09/18/14 1505 09/20/14 1506  ALBUMIN 1.5* 1.5*    Recent Labs Lab 09/20/14 1506 09/21/14 0249  WBC 9.8 8.9  HGB 8.6* 8.3*  HCT 28.5* 26.8*  MCV 94.7 95.7  PLT 290 248   . antiseptic oral rinse  7 mL Mouth Rinse q12n4p  . aspirin  81 mg Oral Daily  .  ceFAZolin (ANCEF) IV  2 g Intravenous Q M,W,F-HD  . chlorhexidine  15 mL Mouth Rinse BID  . cyclobenzaprine  5 mg Oral  BID  . darbepoetin (ARANESP) injection - DIALYSIS  200 mcg Intravenous Q Tue-HD  . enoxaparin (LOVENOX) injection  30 mg Subcutaneous Q24H  . famotidine  20 mg Oral Daily  . feeding supplement (NEPRO CARB STEADY)  237 mL Oral TID WC  . gabapentin  100 mg Oral BID  . insulin aspart  0-9 Units Subcutaneous TID WC  . lanthanum  1,000 mg Oral TID WC  . multivitamin  1 tablet Oral QHS  . nicotine  21 mg Transdermal Daily  . oxyCODONE  10 mg Oral BID WC  . polyethylene glycol  17 g Oral BID  . sodium chloride  10-40 mL Intracatheter Q12H     acetaminophen, ALPRAZolam, bisacodyl, diphenhydrAMINE, gi cocktail, guaiFENesin-dextromethorphan, hydrocerin, magic mouthwash w/lidocaine, methocarbamol, ondansetron (ZOFRAN) IV, oxyCODONE, sodium chloride, sodium chloride, sorbitol, traZODone, zolpidem

## 2014-09-24 NOTE — Progress Notes (Signed)
[facility]     PROGRESS NOTE    Subjective/Complaints: Pt states she did not have HD yesterday  No pain c/os  ROS- GI ok, no resp issues  Objective: Vital Signs: Blood pressure 145/66, pulse 104, temperature 98.7 F (37.1 C), temperature source Oral, resp. rate 17, height 5\' 7"  (1.702 m), weight 82.9 kg (182 lb 12.2 oz), last menstrual period 03/16/2002, SpO2 94 %. No results found. No results for input(s): WBC, HGB, HCT, PLT in the last 72 hours. No results for input(s): NA, K, CL, GLUCOSE, BUN, CREATININE, CALCIUM in the last 72 hours.  Invalid input(s): CO CBG (last 3)   Recent Labs  09/23/14 1623 09/23/14 2102 09/24/14 0637  GLUCAP 130* 119* 120*    Wt Readings from Last 3 Encounters:  09/24/14 82.9 kg (182 lb 12.2 oz)  09/14/14 92.2 kg (203 lb 4.2 oz)  08/08/14 89.812 kg (198 lb)    Physical Exam:  Constitutional: She is oriented to person, place, and time. She appears well-developed and well-nourished.  HENT:  Head: Normocephalic and atraumatic.  Eyes: Conjunctivae are normal. Pupils are equal, round, and reactive to light.  Neck: Normal range of motion. Neck supple.  Cardiovascular: Normal rate, regular rhythm and normal heart sounds.  Respiratory: Effort normal and breath sounds normal.  GI: Soft. Bowel sounds are normal.  Musculoskeletal: She exhibits edema (1+ edema L-forearm with less tenderness). , No pain with left knee ROM Healing incisions bilateral knees with edema L>R. . Moves RUE without difficulty. LUE still tender along AVG site. Mild edema and warmth noted , good thrill Neurological: She is alert and oriented to person, place, and time.  Skin: Skin is warm and dry.  Psychiatric: Her speech is normal. Judgment and thought content normal. Her mood appears pleasant, slightly anxious.     Assessment/Plan: 1. Functional deficits secondary to septic joints with subsequent debilitation which require 3+ hours  per day of interdisciplinary therapy in a comprehensive inpatient rehab setting. Physiatrist is providing close team supervision and 24 hour management of active medical problems listed below. Physiatrist and rehab team continue to assess barriers to discharge/monitor patient progress toward functional and medical goals. FIM: FIM - Bathing Bathing Steps Patient Completed: Chest, Right Arm, Left Arm, Abdomen, Front perineal area, Right upper leg, Left upper leg, Buttocks, Left lower leg (including foot), Right lower leg (including foot) Bathing: 5: Supervision: Safety issues/verbal cues  FIM - Upper Body Dressing/Undressing Upper body dressing/undressing steps patient completed: Thread/unthread left sleeve of pullover shirt/dress, Thread/unthread right sleeve of pullover shirt/dresss, Put head through opening of pull over shirt/dress, Pull shirt over trunk Upper body dressing/undressing: 6: More than reasonable amount of time FIM - Lower Body Dressing/Undressing Lower body dressing/undressing steps patient completed: Thread/unthread right underwear leg, Pull underwear up/down, Thread/unthread right pants leg, Pull pants up/down, Fasten/unfasten pants, Thread/unthread left pants leg, Thread/unthread left underwear leg, Don/Doff left shoe, Don/Doff right shoe, Fasten/unfasten right shoe, Fasten/unfasten left shoe Lower body dressing/undressing: 5: Set-up assist to: Don/Doff TED stocking  FIM - Toileting Toileting: 1: Two helpers (per [employer], NT)  FIM - Air cabin crew Transfers: 1-Two helpers  FIM - Control and instrumentation engineer Devices: Environmental consultant, Arm rests Bed/Chair Transfer: 5: Chair or W/C > Bed: Supervision (verbal cues/safety issues), 5: Bed > Chair or W/C: Supervision (verbal cues/safety issues)  FIM - Locomotion: Wheelchair Distance: 150 Locomotion: Wheelchair: 0: Activity did not occur FIM - Locomotion: Ambulation Locomotion: Ambulation Assistive  Devices: Administrator  Ambulation/Gait Assistance: 5: Supervision Locomotion: Ambulation: 5: Travels 150 ft or more with supervision/safety issues  Comprehension Comprehension Mode: Auditory Comprehension: 5-Understands basic 90% of the time/requires cueing < 10% of the time  Expression Expression Mode: Verbal Expression: 5-Expresses basic needs/ideas: With no assist  Social Interaction Social Interaction: 6-Interacts appropriately with others with medication or extra time (anti-anxiety, antidepressant).  Problem Solving Problem Solving Mode: Asleep Problem Solving: 5-Solves complex 90% of the time/cues < 10% of the time  Memory Memory: 5-Recognizes or recalls 90% of the time/requires cueing < 10% of the time  Medical Problem List and Plan: 1. 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 DVT Prophylaxis/Anticoagulation: Pharmaceutical: Lovenox (hold today) 3. Pain Management:   oxycodone 10 mg prn.   -anxiety mgt  -gabapentin for neuropathic pain 4. Adjustment reaction/ Mood: Team to provide ego support. Patient not interested in antidepressant at this time. LCSW to follow for evaluation and support.  5. Neuropsych: This patient is capable of making decisions on her own behalf. 6. Skin/Wound Care: Routine pressure relief measures. Elevate LUE when in bed  l 7. Fluids/Electrolytes/Nutrition: Needs 1200 FR due to ESRD.  8. ESRD: Schedule HD post therapy. May need daily HD 9. Septic arthritis:  continue Ancef  thru 10/04/14 with HD  -cxr with chf, atelectasis----IS   -volume mgt per renal   -held lovenox given blood tinged/clotted sputum (h/h ok) 10. DM type 2 with neuropathy: Continue to monitor BS ac/hs basis and use SSI for elevated BS. Off glipizide and currently diet controlled.   -sugars under control 11. HTN: Monitor every 8 hours. Continue Hydralazine bid.   LOS (Days) 10 A FACE TO FACE EVALUATION WAS  PERFORMED  KIRSTEINS,ANDREW E 09/24/2014 7:09 AM

## 2014-09-25 ENCOUNTER — Inpatient Hospital Stay (HOSPITAL_COMMUNITY): Payer: Medicaid Other

## 2014-09-25 ENCOUNTER — Inpatient Hospital Stay (HOSPITAL_COMMUNITY): Payer: Medicaid Other | Admitting: Physical Therapy

## 2014-09-25 LAB — RENAL FUNCTION PANEL
Albumin: 1.6 g/dL — ABNORMAL LOW (ref 3.5–5.2)
Anion gap: 9 (ref 5–15)
BUN: 26 mg/dL — ABNORMAL HIGH (ref 6–23)
CO2: 25 mmol/L (ref 19–32)
Calcium: 8.3 mg/dL — ABNORMAL LOW (ref 8.4–10.5)
Chloride: 100 mmol/L (ref 96–112)
Creatinine, Ser: 6.01 mg/dL — ABNORMAL HIGH (ref 0.50–1.10)
GFR calc Af Amer: 8 mL/min — ABNORMAL LOW (ref >90)
GFR calc non Af Amer: 7 mL/min — ABNORMAL LOW (ref >90)
Glucose, Bld: 178 mg/dL — ABNORMAL HIGH (ref 70–99)
Phosphorus: 3 mg/dL (ref 2.3–4.6)
Potassium: 5.1 mmol/L (ref 3.5–5.1)
Sodium: 134 mmol/L — ABNORMAL LOW (ref 135–145)

## 2014-09-25 LAB — CBC
HCT: 30.5 % — ABNORMAL LOW (ref 36.0–46.0)
Hemoglobin: 9.3 g/dL — ABNORMAL LOW (ref 12.0–15.0)
MCH: 28.6 pg (ref 26.0–34.0)
MCHC: 30.5 g/dL (ref 30.0–36.0)
MCV: 93.8 fL (ref 78.0–100.0)
Platelets: 287 10*3/uL (ref 150–400)
RBC: 3.25 MIL/uL — ABNORMAL LOW (ref 3.87–5.11)
RDW: 15.8 % — ABNORMAL HIGH (ref 11.5–15.5)
WBC: 9.9 10*3/uL (ref 4.0–10.5)

## 2014-09-25 LAB — GLUCOSE, CAPILLARY
Glucose-Capillary: 98 mg/dL (ref 70–99)
Glucose-Capillary: 124 mg/dL — ABNORMAL HIGH (ref 70–99)
Glucose-Capillary: 115 mg/dL — ABNORMAL HIGH (ref 70–99)

## 2014-09-25 NOTE — Progress Notes (Signed)
Occupational Therapy Session Note  Patient Details  Name: Brandy Houston MRN: [id number] Date of Birth: 1955/12/25  Today's Date: 09/25/2014 OT Individual Time: 0800-0900 and 1300-1409 OT Individual Time Calculation (min): 60 min and 69 min     Short Term Goals: Week 2:  OT Short Term Goal 1 (Week 2): Pt will complete LB dressing at supervision level OT Short Term Goal 2 (Week 2): Pt will complete toilet transfer at supervision level OT Short Term Goal 3 (Week 2): Pt will stand for 3 min at supervision level while engaging in dynamic balance task OT Short Term Goal 4 (Week 2): Pt will complete meal prep task at supervision level with min cues for safety  Skilled Therapeutic Interventions/Progress Updates:    Session 1: Pt seen for ADL retraining with focus on functional mobility, sit<>stand, standing balance, safety awareness, and activity tolerance. Pt received sitting EOB. Completed sit>stand with min A then ambulated to closet at SBA level using RW to retrieve clothing items. Pt required min cues for placement of clothing over RW. Pt ambulated to toilet, requiring min A for sit<>stand from elevated toilet seat. Completed bathing at shower level with min A for sit<>stand and SBA dynamic standing balance. Pt completed dressing sitting EOB with SBA for sit<>stand and standing balance. Engaged in oral care in standing at sink with supervision for balance. Pt ambulated sink>recliner chair with L knee buckling, however pt able to manage with min A. Pt left sitting in w/c with all needs in reach.   Session 2: Pt seen for 1:1 OT session with focus on BUE strengthening, functional transfers, activity tolerance, and standing balance. Pt received sitting in w/c reporting LLE pain and agreeable to begin session with seated UE exercises (PA verbalized pt was safe to complete UE strengthening exercises to LUE). Pt completed shoulder flexion/extension, abduction/adduction, external/internal rotation, elbow  flexion, and elbow extension exercises using level 1 theraband. Pt required multiple rest breaks due to fatigue, with no LUE pain noted. Pt required max-total cues for completing exercises with demonstration cues, handout, and verbal cues. Pt requesting to toilet, completing stand pivot transfer w/c>toilet with min A using RW and increased pain in LLE noted. Pt required supervision for standing balance during hygiene and stand pivot transfer toilet>w/c. Pt reporting increased fatigue and requesting to return to bed. Pt noted to have L knee warm to touch. Pt left supine in bed with all needs in reach. RN notified of increased pain and L knee warm to touch.   Therapy Documentation Precautions:  Precautions Precautions: Fall Precaution Comments: Will sit without warning when fatigued; bring chair behind for safety Restrictions Weight Bearing Restrictions: Yes RLE Weight Bearing: Weight bearing as tolerated LLE Weight Bearing: Weight bearing as tolerated General:   Vital Signs: Therapy Vitals Pulse Rate: (!) 111 BP: (!) 157/81 mmHg Patient Position (if appropriate): Sitting Oxygen Therapy SpO2: 100 % O2 Device: Not Delivered Pain: Pain Assessment Pain Assessment: 0-10 Pain Score: 3  Pain Type: Acute pain Pain Location: Knee Pain Orientation: Left Pain Descriptors / Indicators: Aching;Sharp Pain Onset: On-going  See FIM for current functional status  Therapy/Group: Individual Therapy  Duayne Cal 09/25/2014, 12:20 PM

## 2014-09-25 NOTE — Procedures (Signed)
Goal of 5000cc.  Marked overload with significant LE edema. AVG LUE functioning.  Assessment: 1. 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 MSSA bacteremia/ septic polyarthritis - on Ancef x 8 wks w HD, TEE was neg 3. Vol excess / LE edema - improving slowly 4. 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 Hep C+ 7. DM per primary 8. HTN - no meds 9. LUE AVGG on 3/30  Plan - HD at [facility] MWF at noon as outpt  Will plan HD in AM again for volume. Phoenix Riesen C

## 2014-09-25 NOTE — Progress Notes (Signed)
Physical Therapy Session Note  Patient Details  Name: Brandy Houston MRN: [id number] Date of Birth: 10/02/1955  Today's Date: 09/25/2014 PT Individual Time: 1000-1100 PT Individual Time Calculation (min): 60 min   Short Term Goals: Week 2:  PT Short Term Goal 1 (Week 2): = LTGs due to anticipated LOS  Skilled Therapeutic Interventions/Progress Updates:   Session focused on transfers, ambulation, strengthening, and activity tolerance. Patient received sitting in recliner with BLE elevated, able to transfer sit > stand on third attempt from recliner with supervision using RW. Gait training 2 x 150 ft with supervision, verbal/tactile cues for upright posture and forward gaze. Patient instructed in standing OTAGO exercises for BLE strengthening and falls prevention using RW for UE support: alt marching x 20, heel raises x 20, knee flexion x 10 each LE, hip abduction x 10 each LE, sit <> stand 2 x 5 with no AD.  Patient negotiated up/down eight 6" steps using 2 rails with supervision, step-to pattern. As patient completed last step, LLE buckled resulting in patient being lowered to ground to long sitting position with therapist assistance. Patient with no c/o increased pain, seated BP 157/81, HR 111, Sp02 100% on room air. Patient returned to wheelchair with +2 assistance. Patient ambulated back to room using RW and left sitting in wheelchair with all needs within reach, RN notified of episode on stairs.   Therapy Documentation Precautions:  Precautions Precautions: Fall Precaution Comments: Will sit without warning when fatigued; bring chair behind for safety Restrictions Weight Bearing Restrictions: Yes RLE Weight Bearing: Weight bearing as tolerated LLE Weight Bearing: Weight bearing as tolerated Pain: Pain Assessment Pain Assessment: 0-10 Pain Score: 3  Pain Type: Acute pain Pain Location: Knee Pain Orientation: Left Pain Descriptors / Indicators: Aching;Sharp Pain Onset: On-going  See  FIM for current functional status  Therapy/Group: Individual Therapy  Laretta Alstrom 09/25/2014, 12:13 PM

## 2014-09-25 NOTE — Plan of Care (Signed)
Problem: RH Leisure Awareness Goal: LTG: Patient will participate in leisure activities (TR) LTG: Patient will participate in leisure activities (simple/moderate/difficult) to increase ability to functionally perform activity, identify and utilize resources, identify new leisure interests, utilize adaptive equipment at specific level (TR)  Outcome: Not Applicable Date Met:  63/87/56 Goal  & treatment plan discontinued

## 2014-09-25 NOTE — Progress Notes (Signed)
[place] PHYSICAL MEDICINE & REHABILITATION     PROGRESS NOTE    Subjective/Complaints: Pt states she did not have HD yesterday  No pain c/os  ROS- GI ok, no resp issues  Objective: Vital Signs: Blood pressure 128/65, pulse 94, temperature 98 F (36.7 C), temperature source Oral, resp. rate 18, height 5\' 7"  (1.702 m), weight 83.4 kg (183 lb 13.8 oz), last menstrual period 03/16/2002, SpO2 96 %. No results found. No results for input(s): WBC, HGB, HCT, PLT in the last 72 hours. No results for input(s): NA, K, CL, GLUCOSE, BUN, CREATININE, CALCIUM in the last 72 hours.  Invalid input(s): CO CBG (last 3)   Recent Labs  09/24/14 1641 09/24/14 2057 09/25/14 0700  GLUCAP 113* 153* 98    Wt Readings from Last 3 Encounters:  09/25/14 83.4 kg (183 lb 13.8 oz)  09/14/14 92.2 kg (203 lb 4.2 oz)  08/08/14 89.812 kg (198 lb)    Physical Exam:  Constitutional: She is oriented to person, place, and time. She appears well-developed and well-nourished.  HENT:  Head: Normocephalic and atraumatic.  Eyes: Conjunctivae are normal. Pupils are equal, round, and reactive to light.  Neck: Normal range of motion. Neck supple.  Cardiovascular: Normal rate, regular rhythm and normal heart sounds.  Respiratory: Effort normal and breath sounds normal.  GI: Soft. Bowel sounds are normal.  Musculoskeletal: She exhibits edema (1+ edema L-forearm with less tenderness). , No pain with left knee ROM Healing incisions bilateral knees with edema L>R. Left knee still warm . Moves RUE without difficulty. LUE much less tender along AVG site. Mild edema and warmth noted , good thrill Neurological: She is alert and oriented to person, place, and time.  Skin: Skin is warm and dry.  Psychiatric: Her speech is normal. Judgment and thought content normal. Her mood appears pleasant, slightly anxious.     Assessment/Plan: 1. Functional deficits secondary to septic joints with subsequent debilitation  which require 3+ hours per day of interdisciplinary therapy in a comprehensive inpatient rehab setting. Physiatrist is providing close team supervision and 24 hour management of active medical problems listed below. Physiatrist and rehab team continue to assess barriers to discharge/monitor patient progress toward functional and medical goals. FIM: FIM - Bathing Bathing Steps Patient Completed: Chest, Right Arm, Left Arm, Abdomen, Front perineal area, Right upper leg, Left upper leg, Buttocks, Left lower leg (including foot), Right lower leg (including foot) Bathing: 5: Supervision: Safety issues/verbal cues  FIM - Upper Body Dressing/Undressing Upper body dressing/undressing steps patient completed: Thread/unthread left sleeve of pullover shirt/dress, Thread/unthread right sleeve of pullover shirt/dresss, Put head through opening of pull over shirt/dress, Pull shirt over trunk Upper body dressing/undressing: 6: More than reasonable amount of time FIM - Lower Body Dressing/Undressing Lower body dressing/undressing steps patient completed: Thread/unthread right underwear leg, Pull underwear up/down, Thread/unthread right pants leg, Pull pants up/down, Fasten/unfasten pants, Thread/unthread left pants leg, Thread/unthread left underwear leg, Don/Doff left shoe, Don/Doff right shoe, Fasten/unfasten right shoe, Fasten/unfasten left shoe Lower body dressing/undressing: 5: Set-up assist to: Don/Doff TED stocking  FIM - Toileting Toileting: 1: Two helpers (per [employer], NT)  FIM - Air cabin crew Transfers: 1-Two helpers  FIM - Control and instrumentation engineer Devices: Environmental consultant, Arm rests Bed/Chair Transfer: 6: Supine > Sit: No assist, 5: Bed > Chair or W/C: Supervision (verbal cues/safety issues), 5: Chair or W/C > Bed: Supervision (verbal cues/safety issues)  FIM - Locomotion: Wheelchair Distance: 150 Locomotion: Wheelchair: 0: Activity did not occur FIM -  Locomotion:  Ambulation Locomotion: Ambulation Assistive Devices: Administrator Ambulation/Gait Assistance: 5: Supervision Locomotion: Ambulation: 5: Travels 150 ft or more with supervision/safety issues  Comprehension Comprehension Mode: Auditory Comprehension: 5-Understands basic 90% of the time/requires cueing < 10% of the time  Expression Expression Mode: Verbal Expression: 4-Expresses basic 75 - 89% of the time/requires cueing 10 - 24% of the time. Needs helper to occlude trach/needs to repeat words.  Social Interaction Social Interaction: 6-Interacts appropriately with others with medication or extra time (anti-anxiety, antidepressant).  Problem Solving Problem Solving Mode: Asleep Problem Solving: 4-Solves basic 75 - 89% of the time/requires cueing 10 - 24% of the time  Memory Memory: 6-More than reasonable amt of time  Medical Problem List and Plan: 1. 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 DVT Prophylaxis/Anticoagulation: Pharmaceutical: Lovenox resumed without issues 3. Pain Management:   oxycodone 10 mg prn.   -anxiety mgt  -gabapentin for neuropathic pain  -encouraged ICE after activities 4. Adjustment reaction/ Mood: Team to provide ego support. Patient not interested in antidepressant at this time. LCSW to follow for evaluation and support.  5. Neuropsych: This patient is capable of making decisions on her own behalf. 6. Skin/Wound Care: Routine pressure relief measures. Elevate LUE when in bed  l 7. Fluids/Electrolytes/Nutrition: Needs 1200 FR due to ESRD.  8. ESRD: Schedule HD post therapy. May need daily HD 9. Septic arthritis:  continue Ancef  thru 10/04/14 with HD  -cxr with chf, atelectasis----IS   -volume mgt per renal   - 10. DM type 2 with neuropathy: Continue to monitor BS ac/hs basis and use SSI for elevated BS. Off glipizide and currently diet controlled.   -sugars under control 11. HTN:  Monitor every 8 hours. Continue Hydralazine bid.   LOS (Days) 11 A FACE TO FACE EVALUATION WAS PERFORMED  Brandy Houston T 09/25/2014 7:57 AM

## 2014-09-25 NOTE — Progress Notes (Signed)
Late entry: Report of pt's LLE buckling during 10am-11am therapy session. Pt assisted to floor by therapy. No c/o of discomfort after this episode. Pt able to ambulate back to room with RW. Pt refusing scheduled ice pack to extremity @1230 , stated that she would apply ice pack to area prior going to HD. Scheduled 1200 Oxy IR 15mg  given to pt. LLE edematous, and taunt. However, no visible increase swelling than from initial assessment. Extremity warm to touch. Pt able to continue with schedule therapy sessions. Transported to HD at 1430, vital signs WNL. Will have on-coming nurse to assess after HD, and address accordingly.

## 2014-09-25 NOTE — Plan of Care (Signed)
Problem: RH BOWEL ELIMINATION Goal: RH STG MANAGE BOWEL WITH ASSISTANCE STG Manage Bowel with min Assistance. Mod I Outcome: Progressing No report of incontinent episode

## 2014-09-26 ENCOUNTER — Inpatient Hospital Stay (HOSPITAL_COMMUNITY): Payer: Medicaid Other

## 2014-09-26 ENCOUNTER — Inpatient Hospital Stay (HOSPITAL_COMMUNITY): Payer: Medicaid Other | Admitting: Physical Therapy

## 2014-09-26 LAB — GLUCOSE, CAPILLARY
Glucose-Capillary: 143 mg/dL — ABNORMAL HIGH (ref 70–99)
Glucose-Capillary: 75 mg/dL (ref 70–99)
Glucose-Capillary: 105 mg/dL — ABNORMAL HIGH (ref 70–99)

## 2014-09-26 MED ORDER — LIDOCAINE-PRILOCAINE 2.5-2.5 % EX CREA: 1.0000 "application " | TOPICAL_CREAM | CUTANEOUS | Status: DC | PRN

## 2014-09-26 MED ORDER — HEPARIN SODIUM (PORCINE) 1000 UNIT/ML DIALYSIS: 2000.0000 [IU] | INTRAMUSCULAR | Status: DC | PRN

## 2014-09-26 MED ORDER — PENTAFLUOROPROP-TETRAFLUOROETH EX AERO: 1.0000 "application " | INHALATION_SPRAY | CUTANEOUS | Status: DC | PRN

## 2014-09-26 MED ORDER — HEPARIN SODIUM (PORCINE) 1000 UNIT/ML DIALYSIS: 1000.0000 [IU] | INTRAMUSCULAR | Status: DC | PRN

## 2014-09-26 MED ORDER — SODIUM CHLORIDE 0.9 % IV SOLN: 100.0000 mL | INTRAVENOUS | Status: DC | PRN

## 2014-09-26 MED ORDER — ALTEPLASE 2 MG IJ SOLR: 2.0000 mg | Freq: Once | INTRAMUSCULAR | Status: DC | PRN

## 2014-09-26 MED ORDER — LIDOCAINE HCL (PF) 1 % IJ SOLN
5.0000 mL | INTRAMUSCULAR | Status: DC | PRN
Start: 2014-09-26 — End: 2014-09-26

## 2014-09-26 MED ORDER — HEPARIN SODIUM (PORCINE) 1000 UNIT/ML DIALYSIS: 20.0000 [IU]/kg | INTRAMUSCULAR | Status: DC | PRN

## 2014-09-26 MED ORDER — NEPRO/CARBSTEADY PO LIQD
237.0000 mL | ORAL | Status: DC | PRN
  Filled 2014-09-26: qty 237

## 2014-09-26 MED ORDER — LIDOCAINE HCL (PF) 1 % IJ SOLN: 5.0000 mL | INTRAMUSCULAR | Status: DC | PRN

## 2014-09-26 MED ORDER — HEPARIN SODIUM (PORCINE) 1000 UNIT/ML DIALYSIS
2000.0000 [IU] | INTRAMUSCULAR | Status: DC | PRN
Start: 2014-09-27 — End: 2014-09-26

## 2014-09-26 MED ORDER — NEPRO/CARBSTEADY PO LIQD: 237.0000 mL | ORAL | Status: DC | PRN

## 2014-09-26 MED ORDER — HEPARIN SODIUM (PORCINE) 1000 UNIT/ML DIALYSIS: 3500.0000 [IU] | Freq: Once | INTRAMUSCULAR | Status: DC

## 2014-09-26 MED ORDER — LIDOCAINE-PRILOCAINE 2.5-2.5 % EX CREA
1.0000 "application " | TOPICAL_CREAM | CUTANEOUS | Status: DC | PRN
  Filled 2014-09-26: qty 5

## 2014-09-26 MED ORDER — HEPARIN SODIUM (PORCINE) 1000 UNIT/ML DIALYSIS: 3000.0000 [IU] | INTRAMUSCULAR | Status: DC | PRN

## 2014-09-26 MED ORDER — DARBEPOETIN ALFA 200 MCG/0.4ML IJ SOSY
200.0000 ug | PREFILLED_SYRINGE | INTRAMUSCULAR | Status: DC
  Filled 2014-09-26: qty 0.4

## 2014-09-26 MED ORDER — CEFAZOLIN SODIUM 1-5 GM-% IV SOLN
1.0000 g | INTRAVENOUS | Status: DC
Start: 2014-09-26 — End: 2014-09-30
  Administered 2014-09-26 – 2014-09-29 (×4): 1 g via INTRAVENOUS
  Filled 2014-09-26 (×7): qty 50

## 2014-09-26 MED ORDER — ALTEPLASE 2 MG IJ SOLR
2.0000 mg | Freq: Once | INTRAMUSCULAR | Status: DC | PRN
  Filled 2014-09-26: qty 2

## 2014-09-26 NOTE — Procedures (Signed)
Goal of 4000cc. Fluidoverload with significant LE edema. AVG LUE functioning. Tolerating HD treatment.  Assessment: 1. 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 MSSA bacteremia/ septic polyarthritis - on Ancef x 8 wks w HD, TEE was neg 3. Vol excess / LE edema - improving slowly 4. 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 Hep C+ 7. DM per primary 8. HTN - no meds 9. LUE AVGG on 3/30  Plan - HD at [facility] MWF at noon as outpt Will evaluate Wed for next treatment. Shamona Wirtz C

## 2014-09-26 NOTE — Plan of Care (Signed)
Problem: RH BLADDER ELIMINATION Goal: RH OTHER STG BLADDER ELIMINATION GOALS W/ASSIST Other STG Bladder Elimination Goals With min Assistance  Outcome: Not Applicable Date Met:  72/07/21 HD pt. Oligiuric

## 2014-09-26 NOTE — Progress Notes (Signed)
ANTIBIOTIC CONSULT NOTE - FOLLOW UP  Pharmacy Consult for Ancef Indication: disseminated MSSA infection  Allergies  Allergen Reactions  . Compazine [Prochlorperazine] Shortness Of Breath and Swelling    TONGUE SWELLS  . Omnipaque [Iohexol] Hives  . Shellfish-Derived Products Anaphylaxis  . Iodinated Diagnostic Agents Rash    Patient Measurements: Height: 5\' 7"  (170.2 cm) Weight: 172 lb 13.5 oz (78.4 kg) IBW/kg (Calculated) : 61.6  Vital Signs: Temp: 99.6 F (37.6 C) (04/12 1200) Temp Source: Oral (04/12 1200) BP: 139/75 mmHg (04/12 1200) Pulse Rate: 113 (04/12 1200) Intake/Output from previous day: 04/11 0701 - 04/12 0700 In: 240 [P.O.:240] Out: 4000  Intake/Output from this shift: Total I/O In: 240 [P.O.:240] Out: -   Labs:  Recent Labs  09/25/14 1500  WBC 9.9  HGB 9.3*  PLT 287  CREATININE 6.01*   Estimated Creatinine Clearance: 10.9 mL/min (by C-G formula based on Cr of 6.01). No results for input(s): VANCOTROUGH, VANCOPEAK, VANCORANDOM, GENTTROUGH, GENTPEAK, GENTRANDOM, TOBRATROUGH, TOBRAPEAK, TOBRARND, AMIKACINPEAK, AMIKACINTROU, AMIKACIN in the last 72 hours.   Microbiology: Recent Results (from the past 720 hour(s))  Culture, Urine     Status: None   Collection Time: 09/02/14  3:04 PM  Result Value Ref Range Status   Specimen Description URINE, CATHETERIZED  Final   Special Requests NONE  Final   Colony Count   Final    >=100,000 COLONIES/ML Performed at [facility]    Culture   Final    PSEUDOMONAS AERUGINOSA Performed at [facility]    Report Status 09/05/2014 FINAL  Final   Organism ID, Bacteria PSEUDOMONAS AERUGINOSA  Final      Susceptibility   Pseudomonas aeruginosa - MIC*    CEFEPIME 2 SENSITIVE Sensitive     CEFTAZIDIME 4 SENSITIVE Sensitive     CIPROFLOXACIN 0.5 SENSITIVE Sensitive     GENTAMICIN <=1 SENSITIVE Sensitive     IMIPENEM >=16 RESISTANT Resistant     PIP/TAZO 8 SENSITIVE Sensitive     TOBRAMYCIN <=1  SENSITIVE Sensitive     * PSEUDOMONAS AERUGINOSA  Surgical pcr screen     Status: None   Collection Time: 09/13/14  1:12 AM  Result Value Ref Range Status   MRSA, PCR NEGATIVE NEGATIVE Final   Staphylococcus aureus NEGATIVE NEGATIVE Final    Comment:        The Xpert SA Assay (FDA approved for NASAL specimens in patients over 60 years of age), is one component of a comprehensive surveillance program.  Test performance has been validated by [facility] for patients greater than or equal to 24 year old. It is not intended to diagnose infection nor to guide or monitor treatment.      Assessment: 59 yoF admitted with intractable shoulder pain, 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 Patient continues on Ancef, planning for 8 weeks of therapy (end date 4/21). Patient with ARF, started on HD this admission.  Now deemed ESRD, s/p AVG creation on 3/30 for long-term HD scheduled MWF. Ancef being given M/W/F with HD. Tm 99.6, wbc wnl. She had 4 hr HD yesterday, and received ancef 2g. Per renal note yesterday, will plan for HD again today.   Goal of Therapy:  Eradication of infection  Plan:  -Change Ancef to 1g Q 24 hr in PM to avoid missing dose since pt is not on a stable MWF schedule yet. -Monitor clinical progress -If pt Discharge home soon on MWF HD, can change ancef back to 2g QHD   -Change Aranesp  to Q Wed, since looks like she will be on MWF schedule.    Maryanna Shape, PharmD, BCPS  Clinical Pharmacist  Pager: [phone]   09/26/2014 1:47 PM

## 2014-09-26 NOTE — Plan of Care (Signed)
Problem: RH BOWEL ELIMINATION Goal: RH STG MANAGE BOWEL WITH ASSISTANCE STG Manage Bowel with min Assistance. Mod I  Outcome: Progressing No incontinent episode reported

## 2014-09-26 NOTE — Progress Notes (Signed)
Physical Therapy Session Note  Patient Details  Name: [name] MRN: [id number] Date of Birth: June 24, 1955  Today's Date: 09/26/2014 PT Individual Time: 1000-1100 PT Individual Time Calculation (min): 60 min   Short Term Goals: Week 2:  PT Short Term Goal 1 (Week 2): = LTGs due to anticipated LOS  Skilled Therapeutic Interventions/Progress Updates:   Session focused on functional ambulation, transfers, standing balance, NMR, activity tolerance. Patient received sitting in wheelchair, PA present. Patient ambulated around room using RW with supervision to retrieve paperwork for PA from drawer before ambulating to therapy gym using RW with supervision. Patient performed multiple stand pivot transfers mat <> arm chair without AD and min guard progressed to supervision. Gait without AD with S-minA x 50 ft, gait using 1 rail in hallway for light UE support x 50 ft with supervision. To challenge dynamic standing balance, patient participated in 2 games of horseshoes without UE support reaching outside BOS for horseshoes and tossing with S-min guard. Patient performed NuStep using BUE/BLE at level 4 x 10 min for BLE NMR and endurance. Patient ambulated back to room using RW and left sitting in wheelchair with all needs within reach.     Therapy Documentation Precautions:  Precautions Precautions: Fall Precaution Comments: Will sit without warning when fatigued; bring chair behind for safety Restrictions Weight Bearing Restrictions: Yes RLE Weight Bearing: Weight bearing as tolerated LLE Weight Bearing: Weight bearing as tolerated Pain: Pain Assessment Pain Assessment: 0-10 Pain Score: 4  Pain Type: Acute pain Pain Location: Knee Pain Orientation: Left Pain Descriptors / Indicators: Aching;Sharp Pain Onset: On-going Pain Intervention(s): Ambulation/increased activity  See FIM for current functional status  Therapy/Group: Individual Therapy  Laretta Alstrom 09/26/2014, 12:11 PM

## 2014-09-26 NOTE — Progress Notes (Signed)
Occupational Therapy Session Note  Patient Details  Name: Brandy Houston MRN: [id number] Date of Birth: [birth year]  Today's Date: 09/26/2014 OT Individual Time: 0802-0900 and 1300-1415 OT Individual Time Calculation (min): 58 min and 75 min     Short Term Goals: Week 2:  OT Short Term Goal 1 (Week 2): Pt will complete LB dressing at supervision level OT Short Term Goal 2 (Week 2): Pt will complete toilet transfer at supervision level OT Short Term Goal 3 (Week 2): Pt will stand for 3 min at supervision level while engaging in dynamic balance task OT Short Term Goal 4 (Week 2): Pt will complete meal prep task at supervision level with min cues for safety  Skilled Therapeutic Interventions/Progress Updates:    Session 1: Pt seen for ADL retraining with focus on functional mobility, standing balance, safety awareness, and BUE strength. Pt received sitting EOB requesting to sponge bathe this AM. Pt ambulated around room at supervision level using RW to retrieve clothing items, reaching to floor 1x at CGA level. Completed UB bathing and grooming tasks while standing at sink for 5 min without rest break and supervision for balance. Pt requiring min cues for locking w/c brakes during LB self-care. Pt retrieved dirty clothing and propelled self to laundry room for BUE strengthening. Washing machine occupied, therefore pt propelled self to ADL apartment. Practiced simulated walk-in shower transfer with 3" ledge and shower chair. Pt continues to report she has chair for shower at home and will only need BSC ordered. Pt completed transfer initially with CGA then progressed to supervision with min cues. Pt returned to room at end of session and left with all needs in reach.   Session 2: Pt seen for 1:1 OT session with focus on functional mobility, dynamic standing balance, safety awareness, and BUE strengthening. Pt received supine in bed agreeable to therapy. Pt completed supine>sit at supervision level and  donned shoes with increased time. Completed stand pivot transfer bed>w/c with RW at supervision. Pt propelled self to laundry room with focus on BUE strengthing. Pt placed all laundry items in washing machine while standing at supervision with min cues for safety to lock brakes of w/c. Engaged in dynamic balance activity of ambulating around gym to retrieve bean bags from floor with use of reacher. Discussed using reacher to retrieve light items from floor and sitting to retrieve heavier objects or asking for assistance. Pt required mod cues for sequencing to straddle items and overall safety with RW. Pt with 1 LOB laterally during activity requiring min A to correct. Further education on positioning of self and RW to avoid reaching laterally or anteriorly over RW secondary to increasing fall risk. Pt ambulated back to room at supervision level. Completed BUE strengthening exercises with mod verbal and demonstration cues. Pt ambulated to toilet and completed toileting at supervision level before returning to bed. Pt left supine in bed with all needs in reach.   Therapy Documentation Precautions:  Precautions Precautions: Fall Precaution Comments: Will sit without warning when fatigued; bring chair behind for safety Restrictions Weight Bearing Restrictions: Yes RLE Weight Bearing: Weight bearing as tolerated LLE Weight Bearing: Weight bearing as tolerated General:   Vital Signs: Therapy Vitals Temp: 98.9 F (37.2 C) Temp Source: Oral Pulse Rate: (!) 109 Resp: 18 BP: (!) 145/75 mmHg Patient Position (if appropriate): Sitting Oxygen Therapy SpO2: 100 % O2 Device: Not Delivered Pain:  Pt has pain in L knee "about the same as before"   See FIM for current functional  status  Therapy/Group: Individual Therapy  Tamora Huneke, Quillian Quince 09/26/2014, 9:00 AM

## 2014-09-26 NOTE — Progress Notes (Signed)
Nursing Note: Pt returned from [place]. Pt resting quietly in bed. wbb

## 2014-09-26 NOTE — Progress Notes (Signed)
[place] PHYSICAL MEDICINE & REHABILITATION     PROGRESS NOTE    Subjective/Complaints: Left knee buckled in therapy yesterday. Feels better this morning.      Objective: Vital Signs: Blood pressure 126/58, pulse 88, temperature 98.2 F (36.8 C), temperature source Oral, resp. rate 17, height 5\' 7"  (1.702 m), weight 78.4 kg (172 lb 13.5 oz), last menstrual period 03/16/2002, SpO2 94 %. No results found.  Recent Labs  09/25/14 1500  WBC 9.9  HGB 9.3*  HCT 30.5*  PLT 287    Recent Labs  09/25/14 1500  NA 134*  K 5.1  CL 100  GLUCOSE 178*  BUN 26*  CREATININE 6.01*  CALCIUM 8.3*   CBG (last 3)   Recent Labs  09/25/14 1126 09/25/14 1956 09/26/14 0700  GLUCAP 124* 115* 143*    Wt Readings from Last 3 Encounters:  09/25/14 78.4 kg (172 lb 13.5 oz)  09/14/14 92.2 kg (203 lb 4.2 oz)  08/08/14 89.812 kg (198 lb)    Physical Exam:  Constitutional: She is oriented to person, place, and time. She appears well-developed and well-nourished.  HENT:  Head: Normocephalic and atraumatic.  Eyes: Conjunctivae are normal. Pupils are equal, round, and reactive to light.  Neck: Normal range of motion. Neck supple.  Cardiovascular: Normal rate, regular rhythm and normal heart sounds.  Respiratory: Effort normal and breath sounds normal.  GI: Soft. Bowel sounds are normal.  Musculoskeletal: She exhibits edema (1+ edema L-forearm with less tenderness). , No pain with left knee ROM Healing incisions bilateral knees with edema L>R. Left knee still warm--can do full knee extension . Moves RUE without difficulty. LUE much less tender along AVG site. Mild edema and warmth noted , good thrill Neurological: She is alert and oriented to person, place, and time.  Skin: Skin is warm and dry.  Psychiatric: Her speech is normal. Judgment and thought content normal. Her mood appears pleasant, slightly anxious.     Assessment/Plan: 1. Functional deficits secondary to septic  joints with subsequent debilitation which require 3+ hours per day of interdisciplinary therapy in a comprehensive inpatient rehab setting. Physiatrist is providing close team supervision and 24 hour management of active medical problems listed below. Physiatrist and rehab team continue to assess barriers to discharge/monitor patient progress toward functional and medical goals. FIM: FIM - Bathing Bathing Steps Patient Completed: Chest, Right Arm, Left Arm, Abdomen, Front perineal area, Right upper leg, Left upper leg, Buttocks, Left lower leg (including foot), Right lower leg (including foot) Bathing: 4: Steadying assist  FIM - Upper Body Dressing/Undressing Upper body dressing/undressing steps patient completed: Thread/unthread left sleeve of pullover shirt/dress, Thread/unthread right sleeve of pullover shirt/dresss, Put head through opening of pull over shirt/dress, Pull shirt over trunk Upper body dressing/undressing: 5: Supervision: Safety issues/verbal cues FIM - Lower Body Dressing/Undressing Lower body dressing/undressing steps patient completed: Thread/unthread right underwear leg, Pull underwear up/down, Thread/unthread right pants leg, Pull pants up/down, Fasten/unfasten pants, Thread/unthread left pants leg, Thread/unthread left underwear leg, Don/Doff left shoe, Don/Doff right shoe, Fasten/unfasten right shoe Lower body dressing/undressing: 4: Min-Patient completed 75 plus % of tasks  FIM - Toileting Toileting steps completed by patient: Adjust clothing prior to toileting, Performs perineal hygiene, Adjust clothing after toileting Toileting Assistive Devices: Grab bar or rail for support Toileting: 4: Steadying assist  FIM - Radio producer Devices: Environmental consultant, Product manager Transfers: 4-To toilet/BSC: Min A (steadying Pt. > 75%), 4-From toilet/BSC: Min A (steadying Pt. > 75%)  FIM -  Bed/Chair Financial planner Devices: Arm rests,  Copy: 4: Chair or W/C > Bed: Min A (steadying Pt. > 75%)  FIM - Locomotion: Wheelchair Distance: 150 Locomotion: Wheelchair: 0: Activity did not occur FIM - Locomotion: Ambulation Locomotion: Ambulation Assistive Devices: Administrator Ambulation/Gait Assistance: 5: Supervision Locomotion: Ambulation: 5: Travels 150 ft or more with supervision/safety issues  Comprehension Comprehension Mode: Auditory Comprehension: 5-Understands basic 90% of the time/requires cueing < 10% of the time  Expression Expression Mode: Verbal Expression: 4-Expresses basic 75 - 89% of the time/requires cueing 10 - 24% of the time. Needs helper to occlude trach/needs to repeat words.  Social Interaction Social Interaction: 6-Interacts appropriately with others with medication or extra time (anti-anxiety, antidepressant).  Problem Solving Problem Solving Mode: Asleep Problem Solving: 5-Solves basic 90% of the time/requires cueing < 10% of the time  Memory Memory: 6-More than reasonable amt of time  Medical Problem List and Plan: 1. 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 DVT Prophylaxis/Anticoagulation: Pharmaceutical: Lovenox resumed without issues 3. Pain Management:   oxycodone 10 mg prn.   -anxiety mgt  -gabapentin for neuropathic pain  -encouraged ICE after activities  -consider knee sleeve for left leg for support (pt states she would like to hold off at present) 4. Adjustment reaction/ Mood: Team to provide ego support. Patient not interested in antidepressant at this time. LCSW to follow for evaluation and support.  5. Neuropsych: This patient is capable of making decisions on her own behalf. 6. Skin/Wound Care: Routine pressure relief measures. Elevate LUE when in bed  l 7. Fluids/Electrolytes/Nutrition: Needs 1200 FR due to ESRD.  8. ESRD: Schedule HD post therapy. May need daily HD 9. Septic  arthritis:  continue Ancef  thru 10/04/14 with HD  -cxr with chf, atelectasis----IS   -volume mgt per renal  10. DM type 2 with neuropathy: Continue to monitor BS ac/hs basis and use SSI for elevated BS. Off glipizide and currently diet controlled.   -sugars under control 11. HTN: Monitor every 8 hours. Continue Hydralazine bid.   LOS (Days) 12 A FACE TO FACE EVALUATION WAS PERFORMED  Danisha Brassfield T 09/26/2014 7:42 AM

## 2014-09-27 ENCOUNTER — Inpatient Hospital Stay (HOSPITAL_COMMUNITY): Payer: Medicaid Other

## 2014-09-27 ENCOUNTER — Inpatient Hospital Stay (HOSPITAL_COMMUNITY): Payer: Self-pay

## 2014-09-27 LAB — GLUCOSE, CAPILLARY
Glucose-Capillary: 119 mg/dL — ABNORMAL HIGH (ref 70–99)
Glucose-Capillary: 126 mg/dL — ABNORMAL HIGH (ref 70–99)
Glucose-Capillary: 141 mg/dL — ABNORMAL HIGH (ref 70–99)
Glucose-Capillary: 140 mg/dL — ABNORMAL HIGH (ref 70–99)

## 2014-09-27 NOTE — Progress Notes (Signed)
Physical Therapy Note  Patient Details  Name: Brandy Houston MRN: [id number] Date of Birth: May 09, 1956 Today's Date: 09/27/2014    Attempted to see pt for treatment on this date. Pt refused participation, saying it was her birthday and she was exhausted and did not want to do PT today. Pt educated on benefits of upright mobility, but continued to refuse. Pt missed 60 minutes of scheduled PT.   Rada Hay 09/27/2014, 12:28 PM

## 2014-09-27 NOTE — Progress Notes (Signed)
Occupational Therapy Session Note  Patient Details  Name: Chelcie Niedzielski MRN: [id number] Date of Birth: 1955/09/24  Today's Date: 09/27/2014 OT Individual Time: 0902-1000 and 1300-1430 OT Individual Time Calculation (min): 58 min and 60 min     Short Term Goals: Week 2:  OT Short Term Goal 1 (Week 2): Pt will complete LB dressing at supervision level OT Short Term Goal 2 (Week 2): Pt will complete toilet transfer at supervision level OT Short Term Goal 3 (Week 2): Pt will stand for 3 min at supervision level while engaging in dynamic balance task OT Short Term Goal 4 (Week 2): Pt will complete meal prep task at supervision level with min cues for safety  Skilled Therapeutic Interventions/Progress Updates:    Session 1: Pt seen for 1:1 OT session with focus on functional transfers, standing balance, activity tolerance, and safety awareness. Pt received sitting EOB reporting clothing was in washing machine. Pt completed stand pivot transfer bed>w/c at supervision level using RW. Propelled self in w/c to laundry room with focus on activity tolerance and BUE strength. Pt retrieved clothing from w/c level with cues to lock w/c brakes. Pt initiated use of reacher to assist with task. Returned to room and completed LB dressing from w/c level with cues again to lock w/c brakes prior to sit<>stand. Pt completed 3/3 grooming task in standing at sink at supervision for balance. Pt stood approx 6 minutes without UE support with SBA for balance as she folded laundry. Pt frustrated by messy room, therefore spent remainder of session cleaning room from w/c and sit<>stand level with focus on safety, standing balance, and management of w/c parts. At end of session pt left sitting in w/c with all needs in reach.   Session 2: Pt seen for 1:1 OT session with focus on functional mobility, standing balance, activity tolerance, and safety awareness. Pt received sitting in w/c agreeable to engage in meal prep task. Pt  propelled self in w/c to ADL apartment. Pt retrieved items for baking a cake from overhead cabinet at supervision level for balance. Pt completed 75% of task in standing, remaining in standing 5-8 min before requiring rest break. Pt required mod cues throughout activity for safety for functional mobility and locking w/c brakes. Pt did initiate placing baking pan in oven from w/c level and asked for assistance to remove item. Pt with good anticipatory awareness as she reported she will only do simple meal when home alone, and use oven when some can assist. Pt stood at sink to wash all dishes, dry dishes and return them to cabinets. Pt propelled self back to room. Completed toilet transfer and toileting at supervision level, ambulating in/out of bathroom. Pt doffed clothing and donned hospital bed before returning to bed. Pt left supine in bed with all needs in reach.   Therapy Documentation Precautions:  Precautions Precautions: Fall Precaution Comments: Will sit without warning when fatigued; bring chair behind for safety Restrictions Weight Bearing Restrictions: Yes RLE Weight Bearing: Weight bearing as tolerated LLE Weight Bearing: Weight bearing as tolerated General:   Vital Signs:  Pain: Pain in L knee; RN provided pain medication  See FIM for current functional status  Therapy/Group: Individual Therapy  Duayne Cal 09/27/2014, 12:20 PM

## 2014-09-27 NOTE — Progress Notes (Signed)
[facility]IC[facility]ILITATION     PROGRESS NOTE    Subjective/Complaints: Feels tired this morning. Pain under fair control.      Objective: Vital Signs: Blood pressure 158/62, pulse 98, temperature 99.2 F (37.3 C), temperature source Oral, resp. rate 18, height 5\' 7"  (1.702 m), weight 75.8 kg (167 lb 1.7 oz), last menstrual period 03/16/2002, SpO2 97 %. No results found.  Recent Labs  09/25/14 1500  WBC 9.9  HGB 9.3*  HCT 30.5*  PLT 287    Recent Labs  09/25/14 1500  NA 134*  K 5.1  CL 100  GLUCOSE 178*  BUN 26*  CREATININE 6.01*  CALCIUM 8.3*   CBG (last 3)   Recent Labs  09/26/14 1120 09/26/14 2038 09/27/14 0654  GLUCAP 75 105* 119*    Wt Readings from Last 3 Encounters:  09/27/14 75.8 kg (167 lb 1.7 oz)  09/14/14 92.2 kg (203 lb 4.2 oz)  08/08/14 89.812 kg (198 lb)    Physical Exam:  Constitutional: She is oriented to person, place, and time. She appears well-developed and well-nourished.  HENT:  Head: Normocephalic and atraumatic.  Eyes: Conjunctivae are normal. Pupils are equal, round, and reactive to light.  Neck: Normal range of motion. Neck supple.  Cardiovascular: Normal rate, regular rhythm and normal heart sounds.  Respiratory: Effort normal and breath sounds normal.  GI: Soft. Bowel sounds are normal.  Musculoskeletal: She exhibits continued edema in lower exts (1+ edema L-forearm with less tenderness). , No pain with left knee ROM Healing incisions bilateral knees with edema L>R. Left knee still warm--can do full knee extension . Moves RUE without difficulty. LUE much less tender along AVG site. Mild edema and warmth noted , good thrill Neurological: She is alert and oriented to person, place, and time.  Skin: Skin is warm and dry.  Psychiatric: Her speech is normal. Judgment and thought content normal. Her mood appears pleasant, slightly anxious.     Assessment/Plan: 1. Functional deficits secondary to septic  joints with subsequent debilitation which require 3+ hours per day of interdisciplinary therapy in a comprehensive inpatient rehab setting. Physiatrist is providing close team supervision and 24 hour management of active medical problems listed below. Physiatrist and rehab team continue to assess barriers to discharge/monitor patient progress toward functional and medical goals. FIM: FIM - Bathing Bathing Steps Patient Completed: Chest, Right Arm, Left Arm, Abdomen, Front perineal area, Right upper leg, Left upper leg, Buttocks, Left lower leg (including foot), Right lower leg (including foot) Bathing: 5: Supervision: Safety issues/verbal cues  FIM - Upper Body Dressing/Undressing Upper body dressing/undressing steps patient completed: Thread/unthread left sleeve of pullover shirt/dress, Thread/unthread right sleeve of pullover shirt/dresss, Put head through opening of pull over shirt/dress, Pull shirt over trunk Upper body dressing/undressing: 5: Supervision: Safety issues/verbal cues FIM - Lower Body Dressing/Undressing Lower body dressing/undressing steps patient completed: Thread/unthread right underwear leg, Pull underwear up/down, Thread/unthread right pants leg, Pull pants up/down, Fasten/unfasten pants, Thread/unthread left pants leg, Thread/unthread left underwear leg, Don/Doff left shoe, Don/Doff right shoe, Fasten/unfasten right shoe, Fasten/unfasten left shoe Lower body dressing/undressing: 5: Set-up assist to: Don/Doff TED stocking  FIM - Toileting Toileting steps completed by patient: Adjust clothing prior to toileting, Performs perineal hygiene, Adjust clothing after toileting Toileting Assistive Devices: Grab bar or rail for support Toileting: 5: Supervision: Safety issues/verbal cues  FIM - Radio producer Devices: Environmental consultant, Product manager Transfers: 5-To toilet/BSC: Supervision (verbal cues/safety issues), 5-From toilet/BSC: Supervision (verbal  cues/safety issues)  FIM - Control and instrumentation engineer Devices: Arm rests, Copy: 5: Bed > Chair or W/C: Supervision (verbal cues/safety issues), 5: Chair or W/C > Bed: Supervision (verbal cues/safety issues)  FIM - Locomotion: Wheelchair Distance: 150 Locomotion: Wheelchair: 0: Activity did not occur FIM - Locomotion: Ambulation Locomotion: Ambulation Assistive Devices: Administrator, Other (comment) (none) Ambulation/Gait Assistance: 5: Supervision, 4: Min assist Locomotion: Ambulation: 4: Travels 150 ft or more with minimal assistance (Pt.>75%)  Comprehension Comprehension Mode: Auditory Comprehension: 5-Follows basic conversation/direction: With no assist  Expression Expression Mode: Verbal Expression: 5-Expresses basic needs/ideas: With no assist  Social Interaction Social Interaction: 6-Interacts appropriately with others with medication or extra time (anti-anxiety, antidepressant).  Problem Solving Problem Solving Mode: Asleep Problem Solving: 5-Solves basic 90% of the time/requires cueing < 10% of the time  Memory Memory: 6-More than reasonable amt of time  Medical Problem List and Plan: 1. 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 DVT Prophylaxis/Anticoagulation: Pharmaceutical: Lovenox resumed without issues 3. Pain Management:   oxycodone 10 mg prn.   -anxiety mgt  -gabapentin for neuropathic pain  -encouraged ICE after activities  -consider knee sleeve for left leg for support (pt states she would like to hold off at present) 4. Adjustment reaction/ Mood: Team to provide ego support. Patient not interested in antidepressant at this time. LCSW to follow for evaluation and support.  5. Neuropsych: This patient is capable of making decisions on her own behalf. 6. Skin/Wound Care: Routine pressure relief measures. Elevate LUE when in bed  l 7.  Fluids/Electrolytes/Nutrition: Needs 1200 FR due to ESRD.  8. ESRD: Schedule HD post therapy. Volume mgt/HD schedule per nephrology 9. Septic arthritis:  continue Ancef  thru 10/04/14 with HD  -cxr with chf, atelectasis----IS   -volume mgt per renal  10. DM type 2 with neuropathy: Continue to monitor BS ac/hs basis and use SSI for elevated BS. Off glipizide and currently diet controlled.   -sugars under control 11. HTN: Monitor every 8 hours. Continue Hydralazine bid.   LOS (Days) 13   A FACE TO FACE EVALUATION WAS PERFORMED  Arbie Blankley T 09/27/2014 7:51 AM

## 2014-09-28 ENCOUNTER — Inpatient Hospital Stay (HOSPITAL_COMMUNITY): Payer: Medicaid Other

## 2014-09-28 ENCOUNTER — Inpatient Hospital Stay (HOSPITAL_COMMUNITY): Payer: Medicaid Other | Admitting: Physical Therapy

## 2014-09-28 LAB — RENAL FUNCTION PANEL
Albumin: 1.6 g/dL — ABNORMAL LOW (ref 3.5–5.2)
Anion gap: 11 (ref 5–15)
BUN: 20 mg/dL (ref 6–23)
CO2: 29 mmol/L (ref 19–32)
Calcium: 8 mg/dL — ABNORMAL LOW (ref 8.4–10.5)
Chloride: 94 mmol/L — ABNORMAL LOW (ref 96–112)
Creatinine, Ser: 5.02 mg/dL — ABNORMAL HIGH (ref 0.50–1.10)
GFR calc Af Amer: 10 mL/min — ABNORMAL LOW (ref >90)
GFR calc non Af Amer: 9 mL/min — ABNORMAL LOW (ref >90)
Glucose, Bld: 119 mg/dL — ABNORMAL HIGH (ref 70–99)
Phosphorus: 4 mg/dL (ref 2.3–4.6)
Potassium: 3.6 mmol/L (ref 3.5–5.1)
Sodium: 134 mmol/L — ABNORMAL LOW (ref 135–145)

## 2014-09-28 LAB — CBC
HCT: 27.4 % — ABNORMAL LOW (ref 36.0–46.0)
Hemoglobin: 8.6 g/dL — ABNORMAL LOW (ref 12.0–15.0)
MCH: 28.8 pg (ref 26.0–34.0)
MCHC: 31.4 g/dL (ref 30.0–36.0)
MCV: 91.6 fL (ref 78.0–100.0)
Platelets: 310 10*3/uL (ref 150–400)
RBC: 2.99 MIL/uL — ABNORMAL LOW (ref 3.87–5.11)
RDW: 15.2 % (ref 11.5–15.5)
WBC: 7.6 10*3/uL (ref 4.0–10.5)

## 2014-09-28 LAB — GLUCOSE, CAPILLARY
Glucose-Capillary: 104 mg/dL — ABNORMAL HIGH (ref 70–99)
Glucose-Capillary: 121 mg/dL — ABNORMAL HIGH (ref 70–99)
Glucose-Capillary: 106 mg/dL — ABNORMAL HIGH (ref 70–99)
Glucose-Capillary: 149 mg/dL — ABNORMAL HIGH (ref 70–99)

## 2014-09-28 MED ORDER — FAMOTIDINE 20 MG PO TABS: 20.0000 mg | ORAL_TABLET | Freq: Every day | ORAL | Status: DC

## 2014-09-28 MED ORDER — ZOLPIDEM TARTRATE 5 MG PO TABS: 5.0000 mg | ORAL_TABLET | Freq: Every evening | ORAL | Status: DC | PRN

## 2014-09-28 MED ORDER — LANTHANUM CARBONATE 1000 MG PO CHEW: 1000.0000 mg | CHEWABLE_TABLET | Freq: Three times a day (TID) | ORAL | Status: DC

## 2014-09-28 MED ORDER — HEPARIN SODIUM (PORCINE) 1000 UNIT/ML DIALYSIS: 1000.0000 [IU] | INTRAMUSCULAR | Status: DC | PRN

## 2014-09-28 MED ORDER — POLYETHYLENE GLYCOL 3350 17 G PO PACK: 17.0000 g | PACK | Freq: Two times a day (BID) | ORAL | Status: DC

## 2014-09-28 MED ORDER — RENA-VITE PO TABS: 1.0000 | ORAL_TABLET | Freq: Every day | ORAL | Status: DC

## 2014-09-28 MED ORDER — CARBAMIDE PEROXIDE 6.5 % OT SOLN: 5.0000 [drp] | Freq: Two times a day (BID) | OTIC | Status: DC

## 2014-09-28 MED ORDER — SODIUM CHLORIDE 0.9 % IV SOLN: 100.0000 mL | INTRAVENOUS | Status: DC | PRN

## 2014-09-28 MED ORDER — ALTEPLASE 2 MG IJ SOLR
2.0000 mg | Freq: Once | INTRAMUSCULAR | Status: AC | PRN
  Filled 2014-09-28: qty 2

## 2014-09-28 MED ORDER — DARBEPOETIN ALFA 200 MCG/0.4ML IJ SOSY
200.0000 ug | PREFILLED_SYRINGE | INTRAMUSCULAR | Status: DC
  Administered 2014-09-29: 200 ug via INTRAVENOUS
  Filled 2014-09-28: qty 0.4

## 2014-09-28 MED ORDER — CARBAMIDE PEROXIDE 6.5 % OT SOLN
5.0000 [drp] | Freq: Two times a day (BID) | OTIC | Status: DC
  Administered 2014-09-28 – 2014-09-29 (×4): 5 [drp] via OTIC
  Filled 2014-09-28: qty 15

## 2014-09-28 MED ORDER — PENTAFLUOROPROP-TETRAFLUOROETH EX AERO: 1.0000 "application " | INHALATION_SPRAY | CUTANEOUS | Status: DC | PRN

## 2014-09-28 MED ORDER — GABAPENTIN 100 MG PO CAPS: 100.0000 mg | ORAL_CAPSULE | Freq: Two times a day (BID) | ORAL | Status: DC

## 2014-09-28 MED ORDER — NEPRO/CARBSTEADY PO LIQD: 237.0000 mL | ORAL | Status: DC | PRN

## 2014-09-28 MED ORDER — HEPARIN SODIUM (PORCINE) 1000 UNIT/ML DIALYSIS: 2000.0000 [IU] | INTRAMUSCULAR | Status: DC | PRN

## 2014-09-28 MED ORDER — LIDOCAINE HCL (PF) 1 % IJ SOLN: 5.0000 mL | INTRAMUSCULAR | Status: DC | PRN

## 2014-09-28 MED ORDER — SODIUM CHLORIDE 0.9 % IV SOLN
100.0000 mL | INTRAVENOUS | Status: DC | PRN
Start: 2014-09-28 — End: 2014-09-28

## 2014-09-28 MED ORDER — ALTEPLASE 2 MG IJ SOLR: 2.0000 mg | Freq: Once | INTRAMUSCULAR | Status: DC | PRN

## 2014-09-28 MED ORDER — CEFAZOLIN SODIUM-DEXTROSE 2-3 GM-% IV SOLR: 2.0000 g | INTRAVENOUS | Status: AC

## 2014-09-28 MED ORDER — NICOTINE 21 MG/24HR TD PT24: 21.0000 mg | MEDICATED_PATCH | Freq: Every day | TRANSDERMAL | Status: DC

## 2014-09-28 MED ORDER — HEPARIN SODIUM (PORCINE) 1000 UNIT/ML DIALYSIS
20.0000 [IU]/kg | INTRAMUSCULAR | Status: DC | PRN
  Administered 2014-09-28: 1600 [IU] via INTRAVENOUS_CENTRAL

## 2014-09-28 MED ORDER — NEPRO/CARBSTEADY PO LIQD
237.0000 mL | ORAL | Status: DC | PRN
Start: 2014-09-28 — End: 2014-09-28

## 2014-09-28 MED ORDER — LIDOCAINE-PRILOCAINE 2.5-2.5 % EX CREA: 1.0000 "application " | TOPICAL_CREAM | CUTANEOUS | Status: DC | PRN

## 2014-09-28 MED ORDER — ALTEPLASE 2 MG IJ SOLR
2.0000 mg | Freq: Once | INTRAMUSCULAR | Status: DC | PRN
  Filled 2014-09-28: qty 2

## 2014-09-28 MED ORDER — HYDROCERIN EX CREA: 1.0000 "application " | TOPICAL_CREAM | Freq: Two times a day (BID) | CUTANEOUS | Status: DC

## 2014-09-28 MED ORDER — CYCLOBENZAPRINE HCL 5 MG PO TABS: 5.0000 mg | ORAL_TABLET | Freq: Two times a day (BID) | ORAL | Status: DC | PRN

## 2014-09-28 MED ORDER — DIPHENHYDRAMINE HCL 25 MG PO CAPS: 25.0000 mg | ORAL_CAPSULE | Freq: Four times a day (QID) | ORAL | Status: DC | PRN

## 2014-09-28 MED ORDER — OXYCODONE HCL 10 MG PO TABS: 10.0000 mg | ORAL_TABLET | Freq: Four times a day (QID) | ORAL | Status: DC | PRN

## 2014-09-28 NOTE — Progress Notes (Signed)
Physical Therapy Discharge Summary  Patient Details  Name: Brandy Houston MRN: [id number] Date of Birth: Sep 12, 1965  Today's Date: 09/28/2014 PT Individual Time: 1305-1405 PT Individual Time Calculation (min): 60 min   Patient has met 8 of 8 long term goals due to improved activity tolerance, improved balance, improved postural control, increased strength, increased range of motion, decreased pain, ability to compensate for deficits, functional use of  right upper extremity, right lower extremity, left upper extremity and left lower extremity, improved attention, improved awareness and improved coordination.  Patient to discharge at an ambulatory level Modified Independent.   Patient's care partner unavailable to provide the necessary physical assistance at discharge.  Reasons goals not met: NA  Recommendation:  Patient will benefit from ongoing skilled PT services in home health setting to continue to advance safe functional mobility, address ongoing impairments in muscle weakness, pain in bilateral shoulder and knee joints (L > R), decreased standing balance and balance strategies, decreased cardiorespiratory endurance, edema, decreased safety awareness, and minimize fall risk.  Equipment: RW and manual wheelchair  Reasons for discharge: treatment goals met and discharge from hospital  Patient/family agrees with progress made and goals achieved: Yes  Skilled Therapeutic Intervention Patient at overall mod I level using RW except supervision for community and stairs. Patient demonstrated improvement on 5TSS with time of 28 sec with UE support from 120 sec with UE support and use of RW on evaluation. Patient provided with OTAGO HEP and performed standing exercises using RW for UE support as needed, 10-20x each exercise. NuStep using BUE/BLE at level 5 x 10 min for strengthening and endurance. Patient with no further questions regarding discharge.   PT  Discharge Precautions/Restrictions Restrictions Weight Bearing Restrictions: Yes RLE Weight Bearing: Weight bearing as tolerated LLE Weight Bearing: Weight bearing as tolerated Pain Pain Assessment Pain Assessment: 0-10 Pain Score: 4  Pain Type: Acute pain Pain Location: Knee Pain Orientation: Left Pain Descriptors / Indicators: Aching Pain Onset: On-going Patients Stated Pain Goal: 2 Pain Intervention(s): Ambulation/increased activity Vision/Perception   No changes from baseline  Cognition Orientation Level: Oriented X4;Other (comment) (memory deficits) Sensation Sensation Light Touch: Appears Intact Hot/Cold: Appears Intact Proprioception: Appears Intact Coordination Gross Motor Movements are Fluid and Coordinated: Yes Fine Motor Movements are Fluid and Coordinated: Yes Motor  Motor Motor: Abnormal postural alignment and control Motor - Discharge Observations: improved pain tolerance with activity; improved overall strength, forward flexed posture  Mobility Bed Mobility Bed Mobility: Supine to Sit;Sit to Supine Supine to Sit: 6: Modified independent (Device/Increase time) Sit to Supine: 6: Modified independent (Device/Increase time) Transfers Transfers: Yes Sit to Stand: 6: Modified independent (Device/Increase time) Stand to Sit: 6: Modified independent (Device/Increase time) Locomotion  Ambulation Ambulation: Yes Ambulation/Gait Assistance: 6: Modified independent (Device/Increase time) Ambulation Distance (Feet): 200 Feet Assistive device: Rolling walker Gait Gait: Yes Gait Pattern: Impaired Gait Pattern: Step-through pattern;Decreased hip/knee flexion - left;Decreased hip/knee flexion - right;Decreased stride length;Trunk flexed Stairs / Additional Locomotion Stairs: Yes Stairs Assistance: 5: Supervision Stair Management Technique: One rail Right;Step to pattern;Forwards Number of Stairs: 12 Height of Stairs: 6 Wheelchair Mobility Wheelchair Mobility:  No (patient ambulatory)  Trunk/Postural Assessment  Cervical Assessment Cervical Assessment: Exceptions to [facility] (forward head) Thoracic Assessment Thoracic Assessment: Exceptions to [facility] (kyphotic) Lumbar Assessment Lumbar Assessment: Exceptions to [facility] (posterior pelvic tilt) Postural Control Postural Control: Deficits on evaluation Protective Responses: delayed/impaired  Balance Balance Balance Assessed: Yes Standardized Balance Assessment Standardized Balance Assessment: Berg Balance Test;Timed Up and Go Test [employer] Test  Sit to Stand: Able to stand  independently using hands Standing Unsupported: Able to stand safely 2 minutes Sitting with Back Unsupported but Feet Supported on Floor or Stool: Able to sit safely and securely 2 minutes Stand to Sit: Controls descent by using hands Transfers: Able to transfer safely, definite need of hands Standing Unsupported with Eyes Closed: Needs help to keep from falling Standing Ubsupported with Feet Together: Able to place feet together independently and stand for 1 minute with supervision From Standing, Reach Forward with Outstretched Arm: Can reach forward >12 cm safely (5") From Standing Position, Pick up Object from Floor: Unable to try/needs assist to keep balance From Standing Position, Turn to Look Behind Over each Shoulder: Looks behind one side only/other side shows less weight shift Turn 360 Degrees: Needs close supervision or verbal cueing Standing Unsupported, Alternately Place Feet on Step/Stool: Able to complete >2 steps/needs minimal assist Standing Unsupported, One Foot in Front: Able to take small step independently and hold 30 seconds Standing on One Leg: Tries to lift leg/unable to hold 3 seconds but remains standing independently Total Score: 31 Timed Up and Go Test TUG: Normal TUG Normal TUG (seconds): 27.67 (using RW 3 trials: 27, 30, 26 sec) Static Standing Balance Static Standing - Balance Support: During  functional activity;Bilateral upper extremity supported Static Standing - Level of Assistance: 6: Modified independent (Device/Increase time) Dynamic Standing Balance Dynamic Standing - Balance Support: During functional activity;Bilateral upper extremity supported Dynamic Standing - Level of Assistance: 6: Modified independent (Device/Increase time)  Five times Sit to Stand Test (FTSS) Method: Use a straight back chair with a solid seat that is 16-18" high. Ask participant to sit on the chair with arms folded across their chest.   Instructions: "Stand up and sit down as quickly as possible 5 times, keeping your arms folded across your chest."   Measurement: Stop timing when the participant stands the 5th time.  TIME: ___28 sec with UE support___ (in seconds)  Times > 13.6 seconds is associated with increased disability and morbidity (Guralnik, 2000) Times > 15 seconds is predictive of recurrent falls in healthy individuals aged 52 and older (Buatois, et al., 2008) Normal performance values in community dwelling individuals aged 74 and older (Bohannon, 2006): o 60-69 years: 11.4 seconds o 70-79 years: 12.6 seconds o 80-89 years: 14.8 seconds  MCID: ? 2.3 seconds for Vestibular Disorders (Meretta, 2006)  Extremity Assessment  RUE Assessment RUE Assessment: Within Functional Limits LUE Assessment LUE Assessment: Within Functional Limits RLE Assessment RLE Assessment: Exceptions to [facility] RLE Strength RLE Overall Strength: Deficits RLE Overall Strength Comments: grossly 4/5, edema improved, painful with resisted knee flexion LLE Assessment LLE Assessment: Exceptions to [facility] LLE Strength LLE Overall Strength: Deficits LLE Overall Strength Comments: grossly 4/5 except 3+/5 hip flexion,. edema improved, painful wtih resisted knee flexion and hip flexion  See FIM for current functional status  Laretta Alstrom 09/28/2014, 8:52 PM

## 2014-09-28 NOTE — Progress Notes (Signed)
Social Work Patient ID: [name], female   DOB: [birth year], 59 y.o.   MRN: [id number]   Have reviewed team conference with pt and she feels she will be ready for d/c on Sat.  She understands that they have scheduled for her to have HD on that day.  I have contacted the renal floor and requested that she be on "first run" if possible.  Have also contacted outpatient HD center to alert them to planned d/c Sat and they are scheduled for her to begin there on Monday 4/18.  Have completed [facility] referral and confirmed with them that pt can begin utilizing the service anytime.  Will continue to follow for support and any additional community referrals needed.  Tavarius Grewe, LCSW

## 2014-09-28 NOTE — Patient Care Conference (Signed)
Inpatient RehabilitationTeam Conference and Plan of Care Update Date: 09/26/2014   Time: 3:15  PM    Patient Name: Brandy Houston      Medical Record Number: [id number]  Date of Birth: [birth year] Sex: Female         Room/Bed: 4M02C/4M02C-01 Payor Info: Payor: MEDICAID PENDING / Plan: MEDICAID PENDING / Product Type: *No Product type* /    Admitting Diagnosis: DECONDITIONED  NEW TID  Admit Date/Time:  09/14/2014  8:22 PM Admission Comments: No comment available   Primary Diagnosis:  Septic joint of right knee joint Principal Problem: Septic joint of right knee joint  Patient Active Problem List   Diagnosis Date Noted  . Physical debility 09/14/2014  . Acute renal failure syndrome   . Hep C w/ coma, chronic   . Joint infection   . Acute renal failure   . Hepatitis C 08/16/2014  . SIRS (systemic inflammatory response syndrome)   . Benign essential HTN   . Septic joint of right hand   . Staphylococcus aureus bacteremia   . Septic joint of right knee joint   . ESRD (end stage renal disease) on dialysis   . Tachycardia 08/07/2014  . Septic joint of left shoulder region 08/07/2014  . Sinus tachycardia 08/07/2014  . BP (high blood pressure) 06/24/2013  . Disorder of peripheral nervous system 06/24/2013  . 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 Diabetes mellitus, type 2 06/24/2013  . Uncontrolled hypertension 02/18/2013  . Type II or unspecified type diabetes mellitus without mention of complication, uncontrolled 02/18/2013  . Chest pain at rest 10/18/2011    Class: Acute  . Shortness of breath 10/18/2011    Class: Acute    Expected Discharge Date: Expected Discharge Date: 09/30/14  Team Members Present: Physician leading conference: Dr. Alger Simons Social Worker Present: Lennart Pall, LCSW Nurse Present: Elliot Cousin, RN PT Present: Carney Living, PT OT Present: Roanna Epley, Griffin Basil, OT SLP Present: Weston Anna, SLP Other (Discipline and Name):  Danne Baxter, RN [facility]) PPS Coordinator present : Daiva Nakayama, RN, [facility]     Current Status/Progress Goal Weekly Team Focus  Medical   pain issues, left knee still sore. HD  see prior  pain, id mgt,    Bowel/Bladder   contient of B+B  continent  assess need for miralax; pt refusing   Swallow/Nutrition/ Hydration             ADL's   supervision to modI   supervision to mod I  d/c planning and safety with functional mobility   Mobility   supervision overall using RW and stairs with 2 rails   mod I household ambulator, supervision stairs  transfers, functional mobility, therex/strengthening, balance, activity tolerance, pt/family education   Communication             Safety/Cognition/ Behavioral Observations            Pain   pain in lft knee and shoulders; using oxy q 4 prn for pain  pain controlled with prn medications  assess need for prn q 4 and effectiveness of medications   Skin   n/a  n/a  n/a    Rehab Goals Patient on target to meet rehab goals: Yes *See Care Plan and progress notes for long and short-term goals.  Barriers to Discharge: lower ext weakness and pain mgt,     Possible Resolutions to Barriers:  see prior    Discharge Planning/Teaching Needs:  home with intermittent assistance of family  Team Discussion:  Still with knee pain but reaching goals of mod i overall.  Plan d/c Sat.  No concerns  Revisions to Treatment Plan:  none   Continued Need for Acute Rehabilitation Level of Care: The patient requires daily medical management by a physician with specialized training in physical medicine and rehabilitation for the following conditions: Daily direction of a multidisciplinary physical rehabilitation program to ensure safe treatment while eliciting the highest outcome that is of practical value to the patient.: Yes Daily medical management of patient stability for increased activity during participation in an intensive rehabilitation regime.:  Yes Daily analysis of laboratory values and/or radiology reports with any subsequent need for medication adjustment of medical intervention for : Post surgical problems;Neurological problems  Brandy Houston 09/29/2014, 3:49 PM

## 2014-09-28 NOTE — Progress Notes (Signed)
Assessment: 1. 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 MSSA bacteremia/ septic polyarthritis - on Ancef x 8 wks w HD, TEE was neg 3. Vol excess / LE edema - improving slowly 4. 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 Hep C+ 7. DM per primary 8. HTN - no meds 9. LUE AVGG on 3/30  Plan - HD at [facility] MWF at noon as outpt Will plan HD today and Sat before discharge  Subjective: Interval History: Has questions about HD and we will try to answer all of them  Objective: Vital signs in last 24 hours: Temp:  [98.3 F (36.8 C)-98.8 F (37.1 C)] 98.3 F (36.8 C) (04/14 0536) Pulse Rate:  [110-111] 111 (04/14 0536) Resp:  [17] 17 (04/13 1453) BP: (135-175)/(77-80) 175/80 mmHg (04/14 0536) SpO2:  [99 %] 99 % (04/14 0536) Weight:  [77.6 kg (171 lb 1.2 oz)] 77.6 kg (171 lb 1.2 oz) (04/14 0536) Weight change: -2 kg (-4 lb 6.6 oz)  Intake/Output from previous day: 04/13 0701 - 04/14 0700 In: 480 [P.O.:480] Out: -  Intake/Output this shift: Total I/O In: 120 [P.O.:120] Out: -   Resp: clear to auscultation bilaterally Chest wall: no tenderness Cardio: regular rate and rhythm, S1, S2 normal, no murmur, click, rub or gallop Extremities: edema 1-2+  Lab Results:  Recent Labs  09/25/14 1500  WBC 9.9  HGB 9.3*  HCT 30.5*  PLT 287   BMET:  Recent Labs  09/25/14 1500  NA 134*  K 5.1  CL 100  CO2 25  GLUCOSE 178*  BUN 26*  CREATININE 6.01*  CALCIUM 8.3*   No results for input(s): PTH in the last 72 hours. Iron Studies: No results for input(s): IRON, TIBC, TRANSFERRIN, FERRITIN in the last 72 hours. Studies/Results: No results found.  Scheduled: . antiseptic oral rinse  7 mL Mouth Rinse q12n4p  . aspirin  81 mg Oral Daily  .  ceFAZolin (ANCEF) IV  1 g Intravenous Q24H  . chlorhexidine  15 mL Mouth Rinse BID  . cyclobenzaprine  5 mg Oral BID  . darbepoetin (ARANESP) injection - DIALYSIS  200 mcg  Intravenous Q Thu-HD  . enoxaparin (LOVENOX) injection  30 mg Subcutaneous Q24H  . famotidine  20 mg Oral Daily  . feeding supplement (NEPRO CARB STEADY)  237 mL Oral TID WC  . gabapentin  100 mg Oral BID  . insulin aspart  0-9 Units Subcutaneous TID WC  . lanthanum  1,000 mg Oral TID WC  . multivitamin  1 tablet Oral QHS  . nicotine  21 mg Transdermal Daily  . oxyCODONE  10 mg Oral BID WC  . polyethylene glycol  17 g Oral BID  . sodium chloride  10-40 mL Intracatheter Q12H     LOS: 14 days   Rylei Codispoti C 09/28/2014,11:43 AM

## 2014-09-28 NOTE — Progress Notes (Signed)
Physical Therapy Session Note  Patient Details  Name: Brandy Houston MRN: [id number] Date of Birth: 15-Apr-1956  Today's Date: 09/28/2014 PT Individual Time: 0800-0900 PT Individual Time Calculation (min): 60 min   Short Term Goals: Week 2:  PT Short Term Goal 1 (Week 2): = LTGs due to anticipated LOS  Skilled Therapeutic Interventions/Progress Updates:   Session focused on functional ambulation and transfers, standing balance, activity tolerance. Patient received sitting in wheelchair. Gait using RW 2 x 150 ft with supervision. Stair training up/down twelve 6" steps using 2 rails with supervision, verbal cues for safe step-to pattern. Patient attempted sit <> stand without UE support but unable to complete from standard chair height, with mat slightly elevated able to come to standing with hands on knees. Patient demonstrates increased fall risk as noted by score of  31/56 on Berg Balance Scale (<36= high risk for falls, close to 100%; 37-45 significant >80%; 46-51 moderate >50%; 52-55 lower >25%). Normal TUG using RW (avg of 3 trials) = 27.67 sec. Patient educated on falls risk and recommendation for use of RW and supervision for stairs at discharge, patient verbalized understanding. Patient ambulated back to room and left sitting in wheelchair with all needs within reach.   Therapy Documentation Precautions:  Precautions Precautions: Fall Precaution Comments: Will sit without warning when fatigued; bring chair behind for safety Restrictions Weight Bearing Restrictions: Yes RLE Weight Bearing: Weight bearing as tolerated LLE Weight Bearing: Weight bearing as tolerated Pain: Pain Assessment Pain Assessment: 0-10 Pain Score: 3  Faces Pain Scale: Hurts a little bit Pain Type: Acute pain Pain Location: Knee Pain Orientation: Left Pain Descriptors / Indicators: Sharp;Aching Pain Frequency: Constant Pain Onset: On-going Pain Intervention(s): Ambulation/increased  activity Balance: Balance Balance Assessed: Yes Standardized Balance Assessment Standardized Balance Assessment: Berg Balance Test;Timed Up and Go Test Berg Balance Test Sit to Stand: Able to stand  independently using hands Standing Unsupported: Able to stand safely 2 minutes Sitting with Back Unsupported but Feet Supported on Floor or Stool: Able to sit safely and securely 2 minutes Stand to Sit: Controls descent by using hands Transfers: Able to transfer safely, definite need of hands Standing Unsupported with Eyes Closed: Needs help to keep from falling Standing Ubsupported with Feet Together: Able to place feet together independently and stand for 1 minute with supervision From Standing, Reach Forward with Outstretched Arm: Can reach forward >12 cm safely (5") From Standing Position, Pick up Object from Floor: Unable to try/needs assist to keep balance From Standing Position, Turn to Look Behind Over each Shoulder: Looks behind one side only/other side shows less weight shift Turn 360 Degrees: Needs close supervision or verbal cueing Standing Unsupported, Alternately Place Feet on Step/Stool: Able to complete >2 steps/needs minimal assist Standing Unsupported, One Foot in Front: Able to take small step independently and hold 30 seconds Standing on One Leg: Tries to lift leg/unable to hold 3 seconds but remains standing independently Total Score: 31 Timed Up and Go Test TUG: Normal TUG Normal TUG (seconds): 27.67 (using RW 3 trials: 27, 30, 26 sec)  See FIM for current functional status  Therapy/Group: Individual Therapy  Laretta Alstrom 09/28/2014, 9:01 AM

## 2014-09-28 NOTE — Progress Notes (Signed)
[place]ILITATION     PROGRESS NOTE    Subjective/Complaints: Up working with OT bathing. Refused PT yesterday because she was tired and it was her birthday     Objective: Vital Signs: Blood pressure 175/80, pulse 111, temperature 98.3 F (36.8 C), temperature source Oral, resp. rate 17, height 5\' 7"  (1.702 m), weight 77.6 kg (171 lb 1.2 oz), last menstrual period 03/16/2002, SpO2 99 %. No results found.  Recent Labs  09/25/14 1500  WBC 9.9  HGB 9.3*  HCT 30.5*  PLT 287    Recent Labs  09/25/14 1500  NA 134*  K 5.1  CL 100  GLUCOSE 178*  BUN 26*  CREATININE 6.01*  CALCIUM 8.3*   CBG (last 3)   Recent Labs  09/27/14 1620 09/27/14 2107 09/28/14 0642  GLUCAP 141* 140* 104*    Wt Readings from Last 3 Encounters:  09/28/14 77.6 kg (171 lb 1.2 oz)  09/14/14 92.2 kg (203 lb 4.2 oz)  08/08/14 89.812 kg (198 lb)    Physical Exam:  Constitutional: She is oriented to person, place, and time. She appears well-developed and well-nourished.  HENT:  Head: Normocephalic and atraumatic.  Eyes: Conjunctivae are normal. Pupils are equal, round, and reactive to light.  Neck: Normal range of motion. Neck supple.  Cardiovascular: Normal rate, regular rhythm and normal heart sounds.  Respiratory: Effort normal and breath sounds normal.  GI: Soft. Bowel sounds are normal.  Musculoskeletal: She exhibits continued edema in lower exts (1+ edema L-forearm with less tenderness). , No pain with left knee ROM Healing incisions bilateral knees with edema L>R. Left knee still warm--can do full knee extension . Moves RUE without difficulty. LUE much less tender along AVG site. Mild edema and warmth noted , good thrill Neurological: She is alert and oriented to person, place, and time.  Skin: Skin is warm and dry.  Psychiatric: Her speech is normal. Judgment and thought content normal. Her mood appears pleasant, slightly anxious.      Assessment/Plan: 1. Functional deficits secondary to septic joints with subsequent debilitation which require 3+ hours per day of interdisciplinary therapy in a comprehensive inpatient rehab setting. Physiatrist is providing close team supervision and 24 hour management of active medical problems listed below. Physiatrist and rehab team continue to assess barriers to discharge/monitor patient progress toward functional and medical goals. FIM: FIM - Bathing Bathing Steps Patient Completed: Chest, Right Arm, Left Arm, Abdomen, Front perineal area, Right upper leg, Left upper leg, Buttocks, Left lower leg (including foot), Right lower leg (including foot) Bathing: 5: Supervision: Safety issues/verbal cues  FIM - Upper Body Dressing/Undressing Upper body dressing/undressing steps patient completed: Thread/unthread left sleeve of pullover shirt/dress, Thread/unthread right sleeve of pullover shirt/dresss, Put head through opening of pull over shirt/dress, Pull shirt over trunk Upper body dressing/undressing: 5: Supervision: Safety issues/verbal cues FIM - Lower Body Dressing/Undressing Lower body dressing/undressing steps patient completed: Thread/unthread right underwear leg, Pull underwear up/down, Thread/unthread right pants leg, Pull pants up/down, Fasten/unfasten pants, Thread/unthread left pants leg, Thread/unthread left underwear leg, Don/Doff left shoe, Don/Doff right shoe, Fasten/unfasten right shoe, Fasten/unfasten left shoe Lower body dressing/undressing: 5: Set-up assist to: Don/Doff TED stocking  FIM - Toileting Toileting steps completed by patient: Adjust clothing prior to toileting, Performs perineal hygiene, Adjust clothing after toileting Toileting Assistive Devices: Grab bar or rail for support Toileting: 5: Supervision: Safety issues/verbal cues  FIM - Radio producer Devices: Environmental consultant, Product manager Transfers: 5-To toilet/BSC: Supervision  (  verbal cues/safety issues), 5-From toilet/BSC: Supervision (verbal cues/safety issues)  FIM - Control and instrumentation engineer Devices: Arm rests, Copy: 5: Bed > Chair or W/C: Supervision (verbal cues/safety issues), 5: Chair or W/C > Bed: Supervision (verbal cues/safety issues)  FIM - Locomotion: Wheelchair Distance: 150 Locomotion: Wheelchair: 0: Activity did not occur FIM - Locomotion: Ambulation Locomotion: Ambulation Assistive Devices: Administrator, Other (comment) (none) Ambulation/Gait Assistance: 5: Supervision, 4: Min assist Locomotion: Ambulation: 4: Travels 150 ft or more with minimal assistance (Pt.>75%)  Comprehension Comprehension Mode: Auditory Comprehension: 5-Follows basic conversation/direction: With no assist  Expression Expression Mode: Verbal Expression: 5-Expresses basic needs/ideas: With no assist  Social Interaction Social Interaction: 6-Interacts appropriately with others with medication or extra time (anti-anxiety, antidepressant).  Problem Solving Problem Solving Mode: Asleep Problem Solving: 5-Solves basic 90% of the time/requires cueing < 10% of the time  Memory Memory: 6-More than reasonable amt of time  Medical Problem List and Plan: 1. 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 DVT Prophylaxis/Anticoagulation: Pharmaceutical: Lovenox resumed without issues 3. Pain Management:   oxycodone 10 mg prn.   -anxiety mgt  -gabapentin for neuropathic pain  -encouraged ICE after activities  -? knee sleeve for left leg for support 4. Adjustment reaction/ Mood: Team to provide ego support. Patient not interested in antidepressant at this time. LCSW to follow for evaluation and support.  5. Neuropsych: This patient is capable of making decisions on her own behalf. 6. Skin/Wound Care: Routine pressure relief measures. Elevate LUE when in bed  l 7.  Fluids/Electrolytes/Nutrition: Needs 1200 FR due to ESRD.  8. ESRD: Schedule HD post therapy. Volume mgt/HD schedule per nephrology 9. Septic arthritis:  continue Ancef  thru 10/04/14 with HD  -cxr with chf, atelectasis----IS   -volume mgt per renal  10. DM type 2 with neuropathy: Continue to monitor BS ac/hs basis and use SSI for elevated BS. Off glipizide and currently diet controlled.   -sugars under control 11. HTN: Monitor every 8 hours. Continue Hydralazine bid.   LOS (Days) 14   A FACE TO FACE EVALUATION WAS PERFORMED  Tawnie Ehresman T 09/28/2014 7:35 AM

## 2014-09-28 NOTE — Progress Notes (Signed)
Occupational Therapy Session Note  Patient Details  Name: Brandy Houston MRN: [id number] Date of Birth: [birth year]  Today's Date: 09/28/2014 OT Individual Time: 0700-0800 and 1100-1200 (make-up session) and 1300-1430 OT Individual Time Calculation (min): 60 min and 60 min and 90 min    Short Term Goals: Week 2:  OT Short Term Goal 1 (Week 2): Pt will complete LB dressing at supervision level OT Short Term Goal 2 (Week 2): Pt will complete toilet transfer at supervision level OT Short Term Goal 3 (Week 2): Pt will stand for 3 min at supervision level while engaging in dynamic balance task OT Short Term Goal 4 (Week 2): Pt will complete meal prep task at supervision level with min cues for safety  Skilled Therapeutic Interventions/Progress Updates:    Session 1: Pt seen for ADL retraining with focus on functional mobility, dynamic standing balance, safety awareness, and activity tolerance. Pt received sitting EOB agreeable to shower this AM. Pt assisted 50% with covering catheter site while in sitting. Discussed using bags or cling wrap with tape to cover site at home. Pt ambulated about room at supervision using RW to retrieve clothing items from closet with no cues for positioning of RW. Pt ambulated to bathroom and completed toileting and bathing at supervision level for sit<>stand and standing balance. Completed dressing sit<>stand from w/c with setup assist to don TED hose. Pt required supervision for standing balance without UE support during clothing management around waist. Pt stood at sink for approx 5 min at supervision level to complete grooming task. Pt required long rest break then ambulated to bathroom to retrieve towels from floor with use of reacher then placed in laundry bag. Pt demonstrated much improved safety with RW throughout therapy session.   Session 2 (make-up): Pt seen for 1:1 OT session with focus on BLE and BUE strengthening exercise, functional mobility, and standing  balance. Pt received sitting in w/c. Propelled self down to therapy gym and completed OTAGO exercises to increase BLE strength and minimize fall risk. Pt completed exercises while standing with RW. Pt completed marching x20, heel raises x20, knee flexion x10 each leg, abduction 10x each leg, and sit<>stand x10 without UE support. Pt propelled self to ADL apartment to remove comforter from w/c level then propelled to laundry room. Pt completed task of washing comforter at mod I level sit<>stand. Pt stood to water plants approx 4 min at supervision level for balance and mod cues for safety when transitioning to side stepping in narrow space. Pt propelled self back to room with focus on BUE strength and left sitting in w/c with all needs in reach.   Session 3: Pt seen for 1:1 OT session with focus on BUE strengthening, functional mobility, standing balance, and activity tolerance. Pt received sitting in w/c eager to go outside. Completed BUE strengthening HEP using level 1 theraband. Pt completed 2 sets x10 reps of shoulder flexion, horizontal abduction/adduction, elbow flexion, and elbow extension. Pt propelled self outside on uneven surface for BUE strengthening. Engaged in bed making task in standing with no UE support at supervision level. Engaged in horseshoe toss game in standing without UE support to challenge balance then pt ambulating with RW to retrieve horseshoes from floor with use of reacher. Pt completed 3 games at supervision level with rest breaks between each. Practiced covering catheter in preparation for bathing using plastic bag and tape. Pt completed task with increased time and min cues. Pt doffed clothing and donned new gown at sit<>stand with supervision.  Pt completed stand pivot transfer w/c>bed with RW at supervision. Pt left supine in bed with all needs in reach.   Therapy Documentation Precautions:  Precautions Precautions: Fall Precaution Comments: Will sit without warning when  fatigued; bring chair behind for safety Restrictions Weight Bearing Restrictions: No RLE Weight Bearing: Weight bearing as tolerated LLE Weight Bearing: Weight bearing as tolerated General:   Vital Signs:  Pain: Pain Assessment Pain Assessment: 0-10 Pain Score: 4  Faces Pain Scale: Hurts a little bit Pain Type: Acute pain Pain Location: Knee Pain Orientation: Left Pain Descriptors / Indicators: Sharp;Aching Pain Onset: On-going Patients Stated Pain Goal: 2 Pain Intervention(s): Medication (See eMAR)  See FIM for current functional status  Therapy/Group: Individual Therapy  Duayne Cal 09/28/2014, 10:45 AM

## 2014-09-28 NOTE — Progress Notes (Signed)
Pharmacy called to note discharge medication prescriptions not able to fill fosrenol as they do not carry due to price. Suggested replacement with calcium carbonate instead for discharge on Saturday.Margarito Liner

## 2014-09-29 ENCOUNTER — Inpatient Hospital Stay (HOSPITAL_COMMUNITY): Payer: Medicaid Other | Admitting: Occupational Therapy

## 2014-09-29 ENCOUNTER — Inpatient Hospital Stay (HOSPITAL_COMMUNITY): Payer: Self-pay | Admitting: Occupational Therapy

## 2014-09-29 ENCOUNTER — Inpatient Hospital Stay (HOSPITAL_COMMUNITY): Payer: Medicaid Other | Admitting: Physical Therapy

## 2014-09-29 LAB — GLUCOSE, CAPILLARY
Glucose-Capillary: 99 mg/dL (ref 70–99)
Glucose-Capillary: 130 mg/dL — ABNORMAL HIGH (ref 70–99)
Glucose-Capillary: 137 mg/dL — ABNORMAL HIGH (ref 70–99)
Glucose-Capillary: 103 mg/dL — ABNORMAL HIGH (ref 70–99)

## 2014-09-29 MED ORDER — FLUTICASONE PROPIONATE 50 MCG/ACT NA SUSP: 1.0000 | Freq: Every day | NASAL | Status: DC

## 2014-09-29 MED ORDER — CALCIUM CARBONATE ANTACID 750 MG PO CHEW: 1.0000 | CHEWABLE_TABLET | Freq: Every day | ORAL | Status: DC

## 2014-09-29 MED ORDER — FLUTICASONE PROPIONATE 50 MCG/ACT NA SUSP
1.0000 | Freq: Every day | NASAL | Status: DC
  Administered 2014-09-29: 1 via NASAL
  Filled 2014-09-29: qty 16

## 2014-09-29 MED ORDER — CALCIUM CARBONATE ANTACID 500 MG PO CHEW
400.0000 mg | CHEWABLE_TABLET | Freq: Three times a day (TID) | ORAL | Status: DC
  Filled 2014-09-29 (×5): qty 2

## 2014-09-29 MED ORDER — DARBEPOETIN ALFA 200 MCG/0.4ML IJ SOSY
PREFILLED_SYRINGE | INTRAMUSCULAR | Status: AC
  Filled 2014-09-29: qty 0.4

## 2014-09-29 NOTE — Progress Notes (Signed)
Pt  Returned to the floor from HD at 1:30 AM. Vitals stable, in no signs of distress. No complaints at this time.  Will continue to monitor.  Kennieth Francois, RN

## 2014-09-29 NOTE — Discharge Instructions (Signed)
Inpatient Rehab Discharge Instructions  Brandy Houston Discharge date and time:  09/30/14  Activities/Precautions/ Functional Status: Activity: activity as tolerated Diet: renal diet 1000 cc fluid/ day Wound Care: keep wound clean and dry   Functional status:  ___ No restrictions     ___ Walk up steps independently ___ 24/7 supervision/assistance   ___ Walk up steps with assistance _X__ Intermittent supervision/assistance  ___ Bathe/dress independently _X__ Walk with walker    ___ Bathe/dress with assistance ___ Walk Independently    ___ Shower independently ___ Walk with assistance    ___ Shower with assistance _X__ No alcohol     ___ Return to work/school ________    COMMUNITY REFERRALS UPON DISCHARGE:    Home Health:       RN                        Agency: [facility] Phone: [phone]   Medical Equipment/Items Ordered: wheelchair, cushion, rolling walker, 3n1 commode                                                     Agency/Supplier: [facility] @ [phone]       Special Instructions:    My questions have been answered and I understand these instructions. I will adhere to these goals and the provided educational materials after my discharge from the hospital.  Patient/Caregiver Signature _______________________________ Date __________  Clinician Signature _______________________________________ Date __________  Please bring this form and your medication list with you to all your follow-up doctor's appointments.

## 2014-09-29 NOTE — Progress Notes (Signed)
Social Work  Discharge Note  The overall goal for the admission was met for:   Discharge location: Yes - home with son who can provide supervision  Length of Stay: Yes - 15 days  Discharge activity level: Yes - modified independent/ supervision for stairs  Home/community participation: Yes  Services provided included: MD, RD, PT, OT, RN, TR, Pharmacy and SW  Financial Services: Other: None, but Medicaid and SSD applications are pending  Follow-up services arranged: Home Health: RN via [facility] (Note that PT was recommended, however, not covered by Medicaid with given diagnosis - daughter to speak with agency and may plan to pay privately), DME: 18x18 lightweight w/c, cushion, rolling walker, 3n1 commode via [facility], Other: referred to [facility] for HD transportation and Patient/Family has no preference for HH/DME agencies  Comments (or additional information):  Patient/Family verbalized understanding of follow-up arrangements: Yes  Individual responsible for coordination of the follow-up plan: pt (with daughter as back-up)  Confirmed correct DME delivered: [name] 09/29/2014    [name]

## 2014-09-29 NOTE — Progress Notes (Signed)
ANTIBIOTIC CONSULT NOTE - INITIAL  Pharmacy Consult for ancef Indication: MSSA bacteremia  Allergies  Allergen Reactions  . Compazine [Prochlorperazine] Shortness Of Breath and Swelling    TONGUE SWELLS  . Omnipaque [Iohexol] Hives  . Shellfish-Derived Products Anaphylaxis  . Iodinated Diagnostic Agents Rash    Patient Measurements: Height: 5\' 7"  (170.2 cm) Weight: 164 lb 14.5 oz (74.8 kg) IBW/kg (Calculated) : 61.6 Adjusted Body Weight:   Vital Signs: Temp: 98.3 F (36.8 C) (04/15 0544) Temp Source: Oral (04/15 0544) BP: 137/68 mmHg (04/15 0544) Pulse Rate: 101 (04/15 0544) Intake/Output from previous day: 04/14 0701 - 04/15 0700 In: 840 [P.O.:840] Out: 3000  Intake/Output from this shift: Total I/O In: 120 [P.O.:120] Out: -   Labs:  Recent Labs  09/28/14 2241  WBC 7.6  HGB 8.6*  PLT 310  CREATININE 5.02*   Estimated Creatinine Clearance: 12.6 mL/min (by C-G formula based on Cr of 5.02). No results for input(s): VANCOTROUGH, VANCOPEAK, VANCORANDOM, GENTTROUGH, GENTPEAK, GENTRANDOM, TOBRATROUGH, TOBRAPEAK, TOBRARND, AMIKACINPEAK, AMIKACINTROU, AMIKACIN in the last 72 hours.   Microbiology: Recent Results (from the past 720 hour(s))  Culture, Urine     Status: None   Collection Time: 09/02/14  3:04 PM  Result Value Ref Range Status   Specimen Description URINE, CATHETERIZED  Final   Special Requests NONE  Final   Colony Count   Final    >=100,000 COLONIES/ML Performed at [facility]    Culture   Final    PSEUDOMONAS AERUGINOSA Performed at [facility]    Report Status 09/05/2014 FINAL  Final   Organism ID, Bacteria PSEUDOMONAS AERUGINOSA  Final      Susceptibility   Pseudomonas aeruginosa - MIC*    CEFEPIME 2 SENSITIVE Sensitive     CEFTAZIDIME 4 SENSITIVE Sensitive     CIPROFLOXACIN 0.5 SENSITIVE Sensitive     GENTAMICIN <=1 SENSITIVE Sensitive     IMIPENEM >=16 RESISTANT Resistant     PIP/TAZO 8 SENSITIVE Sensitive    TOBRAMYCIN <=1 SENSITIVE Sensitive     * PSEUDOMONAS AERUGINOSA  Surgical pcr screen     Status: None   Collection Time: 09/13/14  1:12 AM  Result Value Ref Range Status   MRSA, PCR NEGATIVE NEGATIVE Final   Staphylococcus aureus NEGATIVE NEGATIVE Final    Comment:        The Xpert SA Assay (FDA approved for NASAL specimens in patients over 57 years of age), is one component of a comprehensive surveillance program.  Test performance has been validated by [facility] for patients greater than or equal to 70 year old. It is not intended to diagnose infection nor to guide or monitor treatment.     Medical History: Past Medical History  Diagnosis Date  . Diabetes mellitus   . Hypertension   . Peripheral neuropathy     Medications:  Scheduled:  . antiseptic oral rinse  7 mL Mouth Rinse q12n4p  . aspirin  81 mg Oral Daily  . carbamide peroxide  5 drop Both Ears BID  .  ceFAZolin (ANCEF) IV  1 g Intravenous Q24H  . chlorhexidine  15 mL Mouth Rinse BID  . cyclobenzaprine  5 mg Oral BID  . Darbepoetin Alfa      . darbepoetin (ARANESP) injection - DIALYSIS  200 mcg Intravenous Q Thu-HD  . enoxaparin (LOVENOX) injection  30 mg Subcutaneous Q24H  . famotidine  20 mg Oral Daily  . feeding supplement (NEPRO CARB STEADY)  237 mL Oral TID WC  .  gabapentin  100 mg Oral BID  . insulin aspart  0-9 Units Subcutaneous TID WC  . lanthanum  1,000 mg Oral TID WC  . multivitamin  1 tablet Oral QHS  . nicotine  21 mg Transdermal Daily  . oxyCODONE  10 mg Oral BID WC  . polyethylene glycol  17 g Oral BID  . sodium chloride  10-40 mL Intracatheter Q12H   Infusions:   Assessment: 59 yo female with MSSA bacteremia is currently on ancef therapy.  Patient has ESRD; HD sessions are variable.  Goal of Therapy:  Resolution of infection  Plan:  Cont Ancef to 1g Q 24 hr to avoid missing dose since pt is not on a stable MWF schedule yet. -Monitor clinical progress -If pt Discharge home soon  on MWF HD, can change ancef back to 2g QHD    Domnique Vanegas, Tsz-Yin 09/29/2014,8:15 AM

## 2014-09-29 NOTE — Progress Notes (Signed)
Occupational Therapy Session Note  Patient Details  Name: Brandy Houston MRN: [id number] Date of Birth: [birth year]  Today's Date: 09/29/2014 OT Individual Time: 0930-1100  (90 min)  OT Individual Time Calculation (min): 60 min    Short Term Goals: Week 1:  OT Short Term Goal 1 (Week 1): Pt. will bathe self with supervision OT Short Term Goal 1 - Progress (Week 1): Progressing toward goal OT Short Term Goal 2 (Week 1): Pt. will dress UB with supervision OT Short Term Goal 2 - Progress (Week 1): Met OT Short Term Goal 3 (Week 1): Pt will dress LB with minimal assist OT Short Term Goal 3 - Progress (Week 1): Met OT Short Term Goal 4 (Week 1): Pt. will go from sit to stand with min assist OT Short Term Goal 4 - Progress (Week 1): Met OT Short Term Goal 5 (Week 1): Pt. will transfer to toilet with minimal assist OT Short Term Goal 5 - Progress (Week 1): Met Week 2:  OT Short Term Goal 1 (Week 2): Pt will complete LB dressing at supervision level OT Short Term Goal 2 (Week 2): Pt will complete toilet transfer at supervision level OT Short Term Goal 3 (Week 2): Pt will stand for 3 min at supervision level while engaging in dynamic balance task OT Short Term Goal 4 (Week 2): Pt will complete meal prep task at supervision level with min cues for safety  Skilled Therapeutic Interventions/Progress Updates:    Focus of treatment was standing balance, BUE exercises, transfers, functional mobility.  Pt. Performed Standing balance for 30 sec in 3 exercises with UE.  Performed ball activities with putting in goal and catching before hitting floor with 1 LOB caught by OT.  Engaged in hanging clothes on clothes line for balance and cues to be light on line and balance on legs.  Used biodex for postural stability.  Needed cues to shift weight forward and backward.  Pt performed 3 minute segments before taking rest break.    Provided walker bag for pt to store towels and carry to laundry cart.  PPt taken back to  room and left in wc with all needs in reach.      Therapy Documentation Precautions:  Precautions Precautions: Fall Precaution Comments: Will sit without warning when fatigued; bring chair behind for safety Restrictions Weight Bearing Restrictions: No RLE Weight Bearing: Weight bearing as tolerated LLE Weight Bearing: Weight bearing as tolerated      Pain: Pain Assessment Pain Assessment: No/denies pain Pain Score: 6  Faces Pain Scale: Hurts a little bit Pain Type: Acute pain Pain Location: Leg Pain Orientation: Right;Left Pain Descriptors / Indicators: Aching Pain Frequency: Constant Pain Onset: On-going Pain Intervention(s): Medication (See eMAR) 2nd Pain Site Pain Score: 6 Pain Location: Shoulder Pain Orientation: Right;Left Pain Intervention(s): Medication (See eMAR) ADL: ADL ADL Comments: see FIM      See FIM for current functional status  Therapy/Group: Individual Therapy  Lisa Roca 09/29/2014, 8:20 AM

## 2014-09-29 NOTE — Discharge Summary (Signed)
Physician Discharge Summary  [name] MRN: [id number] DOB/AGE: 1956-04-11 59 y.o.  Admit date: 09/14/2014 Discharge date: 09/30/2014  Discharge Diagnoses:  Principal Problem:   Septic joint of right knee joint Active Problems:   Diabetes mellitus, type 2   Septic joint of left shoulder region   Physical debility   Discharged Condition: Stable.   Significant Diagnostic Studies: Dg Chest 2 View  09/21/2014   CLINICAL DATA:  Cough hemoptysis and low-grade fever, history of diabetes and septic joints, dialysis dependent renal failure.  EXAM: CHEST  2 VIEW  COMPARISON:  PA and lateral chest x-ray of March 8th and September 02, 2014.  FINDINGS: The cardiac silhouette remains enlarged. The pulmonary vascularity is mildly prominent centrally but not greatly changed from the previous study. There is small left pleural effusion and left lower lobe atelectasis. There is a trace right pleural effusion.  IMPRESSION: Findings consistent with mild CHF with left lower lobe atelectasis and small bilateral pleural effusions. Overall the appearance of the chest has improved since the previous studies.   Electronically Signed   By: David  Martinique   On: 09/21/2014 08:01   Dg Chest 2 View  09/02/2014   CLINICAL DATA:  Fever, dialysis patient  EXAM: CHEST  2 VIEW  COMPARISON:  Chest radiograph 08/22/2014  FINDINGS: Right-sided dialysis catheter is in good position. Left central venous line with tip in distal SVC. Normal cardiac silhouette. The bilateral pleural effusions which are moderate. Effusions appear increased compared to prior.  IMPRESSION: Increasing bilateral moderate effusions.   Electronically Signed   By: Suzy Bouchard M.D.   On: 09/02/2014 12:47    Labs:  Basic Metabolic Panel:  Recent Labs Lab 09/25/14 1500 09/28/14 2241  NA 134* 134*  K 5.1 3.6  CL 100 94*  CO2 25 29  GLUCOSE 178* 119*  BUN 26* 20  CREATININE 6.01* 5.02*  CALCIUM 8.3* 8.0*  PHOS 3.0 4.0     CBC:  Recent Labs Lab 09/25/14 1500 09/28/14 2241  WBC 9.9 7.6  HGB 9.3* 8.6*  HCT 30.5* 27.4*  MCV 93.8 91.6  PLT 287 310    CBG:  Recent Labs Lab 09/28/14 1612 09/28/14 2052 09/29/14 0652 09/29/14 1138 09/29/14 1615  GLUCAP 106* 149* 99 130* 137*    Brief HPI:   Brandy Houston is a 59yo AAF with PMH sig for poorly controlled DM, HTN, CKD (baseline Scr 1.3-1.6) and new diagnosis of Hep C infection who was admitted to [facility] 08/07/14 with left shoulder pain. She had an elevated D-Dimer and underwent CT-angio to r/o PE on 08/07/14 and was admitted for further evaluation. work up revealed MSSA bacteremia with septic arthritis of bilateral AC joints, bilateral knees and right hand flexor tendon sheath. She underwent I and D bilateral knees and I and D with drainage of abscess of bilateral shoulder by Dr. [name] on 08/10/14. She has required repeat I and D left knee on 02/26 and 03/05. Dr. Kathrynn Speed consulted for right hand and forearm infection and patient underwent I and D of right flexor tendon sheath and right volar forearm on 02/27. 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  Antibiotics narrowed to Ancef and patient to continue for 8 weeks duration using 2/26 as day 1/56 per Dr. Baxter Flattery.  AVG placed by Dr. Kellie Simmering on 03/30 and  patient has had significant pain LUE with hypersensitivity affecting mobility. Po intake remains poor with decreased energy levels. Therapy ongoing and CIR was recommended due to deconditioned state.    Hospital Course: Brandy Houston was admitted to rehab 09/14/2014 for inpatient therapies to consist of PT and OT at least three hours five days a week. Past admission physiatrist, therapy team and rehab RN have worked together to provide customized collaborative inpatient rehab. Edema  LUE was managed with elevation and pain left forearm has greatly improved by discharge. Pain control has improved with resumption of gabapentin for neuropathic pain and prn use of oxycodone has been effective in managing knee pain. Bilateral knee and shoulder incisions are clean, dry and healing well.  She has tolerated ancef without side effects and is to continue this thorough 10/04/14.  HD has been ongoing past therapy sessions and she has required additional sessions to help manage fluid overload.  She is currently on 1000 cc/FR daily and weight down to 72 kg at discharge. Her blood pressures have been relatively stable.  Diabetes has been monitored with ac/hs checks and blood sugars have been controlled off glipizide. Po intake has been good and mood has been stable. She has made steady progress during her rehab stay and is currently at supervision level to modified independent level.   She will continue to receive follow up [facility] by [place] past discharge.    Rehab course: During patient's stay in rehab weekly team conferences were held to monitor patient's progress, set goals and discuss barriers to discharge. At admission, patient requires moderate to max assist with mobility and max assist with basic self care tasks. She has had improvement in activity tolerance, balance, postural control, as well as ability to compensate for deficits. She is able to complete ADL tasks with supervision for bathing and is independent for dressing with use of AE.  She is able to ambulate 200 feet at modified independent level with RW.  She requires supervision to navigate 12 stairs.  She has been educated on HEP and her son will provide supervision needed past discharge.    Disposition:  Home  Diet: Renal diet.  1000 cc fluid/day.      Medication List    STOP taking these medications        Darbepoetin Alfa 200 MCG/0.4ML Sosy injection  Commonly known as:  ARANESP     ferric gluconate 125 mg in  sodium chloride 0.9 % 100 mL     glipiZIDE 10 MG tablet  Commonly known as:  GLUCOTROL     hydrALAZINE 20 MG/ML injection  Commonly known as:  APRESOLINE     hydrALAZINE 25 MG tablet  Commonly known as:  APRESOLINE     HYDROcodone-acetaminophen 5-325 MG per tablet  Commonly known as:  NORCO/VICODIN     metoprolol 50 MG tablet  Commonly known as:  LOPRESSOR      TAKE these medications        aspirin 81 MG chewable tablet  Chew 81-162 mg by mouth daily as needed (For stroke prevention.).     calcium carbonate 750 MG chewable tablet  Commonly known as:  TUMS E-X 750  Chew 1 tablet (750 mg total) by mouth daily. With your largest meal     carbamide peroxide 6.5 % otic solution  Commonly known as:  DEBROX  Place 5 drops into both ears 2 (two) times daily.     ceFAZolin 2-3 GM-% Solr  Commonly known as:  ANCEF  Inject 50 mLs (2 g total) into the vein every Monday, Wednesday, and Friday with hemodialysis. Till 10/04/14     cyclobenzaprine 5 MG tablet  Commonly known as:  FLEXERIL  Take 1 tablet (5 mg total) by mouth 2 (two) times daily as needed for muscle spasms.     diphenhydrAMINE 25 mg capsule  Commonly known as:  BENADRYL  Take 1 capsule (25 mg total) by mouth every 6 (six) hours as needed for itching.     famotidine 20 MG tablet  Commonly known as:  PEPCID  Take 1 tablet (20 mg total) by mouth daily.     fluticasone 50 MCG/ACT nasal spray  Commonly known as:  FLONASE  Place 1 spray into both nostrils daily.     gabapentin 100 MG capsule  Commonly known as:  NEURONTIN  Take 1 capsule (100 mg total) by mouth 2 (two) times daily.     glucose blood test strip  Use as instructed     hydrocerin Crea  Apply 1 application topically 2 (two) times daily. To dry skin on BLE     multivitamin Tabs tablet  Take 1 tablet by mouth at bedtime.     nicotine 21 mg/24hr patch  Commonly known as:  NICODERM CQ - dosed in mg/24 hours  Place 1 patch (21 mg total) onto the skin  daily.     ON CALL EXPRESS GLUCOSE METER Devi  Use as directed     Oxycodone HCl 10 MG Tabs--Rx # 60 pills   Take 1 tablet (10 mg total) by mouth every 6 (six) hours as needed for severe pain.     polyethylene glycol packet  Commonly known as:  MIRALAX / GLYCOLAX  Take 17 g by mouth 2 (two) times daily.     zolpidem 5 MG tablet  Commonly known as:  AMBIEN  Take 1 tablet (5 mg total) by mouth at bedtime as needed for sleep.       Follow-up Information    Follow up with Meredith Staggers, MD.   Specialty:  Physical Medicine and Rehabilitation   Why:  As needed   Contact information:   [address], [place] [address] [phone]       Follow up with Marianna Payment, MD On 10/05/2014.   Specialty:  Orthopedic Surgery   Why:  9:45 am for follow up appointment   Contact information:   [place] [address] [phone]       Follow up with Carlyle Basques, MD On 10/19/2014.   Specialty:  Infectious Diseases   Why:  BE there at 11 am for follow up with Infectious disease.   Contact information:   [address] [address] [phone]       Follow up with Chari Manning, NP On 10/10/2014.   Specialty:  Internal Medicine   Why:  @ 2:00 pm for hospital follow up appointment   Contact information:   [place] [address] [phone]       Signed: Bary Leriche 09/29/2014, 5:49 PM

## 2014-09-29 NOTE — Progress Notes (Signed)
Occupational Therapy Discharge Summary  Patient Details  Name: Chaneka Trefz MRN: [id number] Date of Birth: [birth year]  Today's Date: 09/29/2014 OT Individual Time: 0730-0830 OT Individual Time Calculation (min): 60 min   Grad day 1:1 self care retraining at shower level with focus on dynamic standing balance, safety with functional ambulation using a RW, shower stall transfer- crossing a step threshold backwards with RW, etc. Pt able to cover her HD port correctly prior to shower. Review of goals met and d/c recommendations.  Participated in changing sheets on her bed in standing using the bed for UE support to navigate around bed; focusing on Dynamic standing balance and activity tolerance and safe mobility. Pt dressed with only A for TED hose.   Patient has met 7 of 7 long term goals due to improved activity tolerance, improved balance, ability to compensate for deficits and functional use of  RIGHT upper and LEFT upper extremity.  Patient to discharge at overall supervision to mod I  level,  Using RW for functional ambulation.  Patient's son can A (per pt) with IADLs.   Reasons goals not met: n/a  Recommendation:  Patient will benefit from ongoing skilled OT services in home health setting to continue to advance functional skills in the area of BADL, iADL and Reduce care partner burden.  Equipment: BSC  Reasons for discharge: treatment goals met and discharge from hospital  Patient/family agrees with progress made and goals achieved: Yes  OT Discharge Precautions/Restrictions  Precautions Precautions: Fall Restrictions Weight Bearing Restrictions: No    Vital Signs Therapy Vitals Temp: 98.3 F (36.8 C) Temp Source: Oral Pulse Rate: (!) 101 Resp: 16 BP: 137/68 mmHg Patient Position (if appropriate): Lying Oxygen Therapy SpO2: 100 % O2 Device: Not Delivered Pain Pain Assessment Pain Assessment: No/denies pain Pain Score: 6  Faces Pain Scale: Hurts a little bit Pain  Type: Acute pain Pain Location: Leg Pain Orientation: Right;Left Pain Descriptors / Indicators: Aching Pain Frequency: Constant Pain Onset: On-going Pain Intervention(s): Medication (See eMAR) 2nd Pain Site Pain Score: 6 Pain Location: Shoulder Pain Orientation: Right;Left Pain Intervention(s): Medication (See eMAR) ADL ADL ADL Comments: see FIM Vision/Perception  Vision- History Baseline Vision/History: No visual deficits Vision- Assessment Vision Assessment?: No apparent visual deficits Eye Alignment: Within Functional Limits  Cognition Overall Cognitive Status: Within Functional Limits for tasks assessed Arousal/Alertness: Awake/alert Orientation Level: Oriented X4 Sequencing: Appears intact Safety/Judgment: Appears intact Sensation Sensation Light Touch: Appears Intact Hot/Cold: Appears Intact Proprioception: Appears Intact Coordination Gross Motor Movements are Fluid and Coordinated: Yes Fine Motor Movements are Fluid and Coordinated: Yes Motor  Motor Motor - Discharge Observations: improved pain tolerance with activity; improved overall strength Mobility  Bed Mobility Supine to Sit: 6: Modified independent (Device/Increase time) Sit to Supine: 6: Modified independent (Device/Increase time) Transfers Transfers: Sit to Stand;Stand to Sit Sit to Stand: 6: Modified independent (Device/Increase time) Stand to Sit: 6: Modified independent (Device/Increase time)  Trunk/Postural Assessment  Cervical Assessment Cervical Assessment:  (forward head) Thoracic Assessment Thoracic Assessment:  (slight kyphotic) Lumbar Assessment Lumbar Assessment: Within Functional Limits Postural Control Postural Control: Within Functional Limits Protective Responses: improved   Balance Static Standing Balance Static Standing - Level of Assistance: 6: Modified independent (Device/Increase time) Dynamic Standing Balance Dynamic Standing - Level of Assistance: 6: Modified  independent (Device/Increase time) Extremity/Trunk Assessment RUE Assessment RUE Assessment: Within Functional Limits LUE Assessment LUE Assessment: Within Functional Limits  See FIM for current functional status  Willeen Cass [facility] 09/29/2014, 7:48 AM

## 2014-09-29 NOTE — Progress Notes (Signed)
Assessment: 1. 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 MSSA bacteremia/ septic polyarthritis - on Ancef x 8 wks w HD, TEE was neg 3. Vol excess / LE edema - improved 4. 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 Hep C+ 7. DM per primary 8. HTN - no meds 9. LUE AVGG on 3/30  Plan - HD at [facility] MWF at noon as outpt Will plan HD Sat before discharge  Will make arrangements for IV ancef on HD (2gm each tx)              Change binder to more affordable Tums  Subjective: Interval History: Weight down 19Kg!  Objective: Vital signs in last 24 hours: Temp:  [97.8 F (36.6 C)-98.7 F (37.1 C)] 98.7 F (37.1 C) (04/15 1413) Pulse Rate:  [100-115] 115 (04/15 1413) Resp:  [16-20] 16 (04/15 1413) BP: (107-153)/(51-90) 107/90 mmHg (04/15 1413) SpO2:  [97 %-100 %] 100 % (04/15 1413) Weight:  [74.8 kg (164 lb 14.5 oz)-77.6 kg (171 lb 1.2 oz)] 74.8 kg (164 lb 14.5 oz) (04/15 0544) Weight change: 0 kg (0 lb)  Intake/Output from previous day: 04/14 0701 - 04/15 0700 In: 840 [P.O.:840] Out: 3000  Intake/Output this shift: Total I/O In: 600 [P.O.:600] Out: -   General appearance: alert and cooperative Resp: clear to auscultation bilaterally Chest wall: no tenderness Cardio: regular rate and rhythm, S1, S2 normal, no murmur, click, rub or gallop Extremities: edema 1  Lab Results:  Recent Labs  09/28/14 2241  WBC 7.6  HGB 8.6*  HCT 27.4*  PLT 310   BMET:  Recent Labs  09/28/14 2241  NA 134*  K 3.6  CL 94*  CO2 29  GLUCOSE 119*  BUN 20  CREATININE 5.02*  CALCIUM 8.0*   No results for input(s): PTH in the last 72 hours. Iron Studies: No results for input(s): IRON, TIBC, TRANSFERRIN, FERRITIN in the last 72 hours. Studies/Results: No results found.  Scheduled: . antiseptic oral rinse  7 mL Mouth Rinse q12n4p  . aspirin  81 mg Oral Daily  . carbamide peroxide  5 drop Both Ears BID  .  ceFAZolin  (ANCEF) IV  1 g Intravenous Q24H  . chlorhexidine  15 mL Mouth Rinse BID  . cyclobenzaprine  5 mg Oral BID  . darbepoetin (ARANESP) injection - DIALYSIS  200 mcg Intravenous Q Thu-HD  . enoxaparin (LOVENOX) injection  30 mg Subcutaneous Q24H  . famotidine  20 mg Oral Daily  . feeding supplement (NEPRO CARB STEADY)  237 mL Oral TID WC  . fluticasone  1 spray Each Nare Daily  . gabapentin  100 mg Oral BID  . insulin aspart  0-9 Units Subcutaneous TID WC  . lanthanum  1,000 mg Oral TID WC  . multivitamin  1 tablet Oral QHS  . nicotine  21 mg Transdermal Daily  . oxyCODONE  10 mg Oral BID WC  . polyethylene glycol  17 g Oral BID  . sodium chloride  10-40 mL Intracatheter Q12H     LOS: 15 days   Sherita Decoste C 09/29/2014,5:55 PM

## 2014-09-29 NOTE — Progress Notes (Addendum)
[place] PHYSICAL MEDICINE & REHABILITATION     PROGRESS NOTE    Subjective/Complaints: Feeling better today. "ready to go home."     Objective: Vital Signs: Blood pressure 137/68, pulse 101, temperature 98.3 F (36.8 C), temperature source Oral, resp. rate 16, height 5\' 7"  (1.702 m), weight 74.8 kg (164 lb 14.5 oz), last menstrual period 03/16/2002, SpO2 100 %. No results found.  Recent Labs  09/28/14 2241  WBC 7.6  HGB 8.6*  HCT 27.4*  PLT 310    Recent Labs  09/28/14 2241  NA 134*  K 3.6  CL 94*  GLUCOSE 119*  BUN 20  CREATININE 5.02*  CALCIUM 8.0*   CBG (last 3)   Recent Labs  09/28/14 1612 09/28/14 2052 09/29/14 0652  GLUCAP 106* 149* 99    Wt Readings from Last 3 Encounters:  09/29/14 74.8 kg (164 lb 14.5 oz)  09/14/14 92.2 kg (203 lb 4.2 oz)  08/08/14 89.812 kg (198 lb)    Physical Exam:  Constitutional: She is oriented to person, place, and time. She appears well-developed and well-nourished.  HENT:  Head: Normocephalic and atraumatic.  Eyes: Conjunctivae are normal. Pupils are equal, round, and reactive to light.  Neck: Normal range of motion. Neck supple.  Cardiovascular: Normal rate, regular rhythm and normal heart sounds.  Respiratory: Effort normal and breath sounds normal.  GI: Soft. Bowel sounds are normal.  Musculoskeletal: She exhibits continued edema in lower exts (1+ edema L-forearm with less tenderness). , No pain with left knee ROM H bilateral knees with edema L>R. Left knee still warm--can do full knee extension however . Moves RUE without difficulty. LUE much less tender along AVG site. Mild edema and warmth noted , good thrill Neurological: She is alert and oriented to person, place, and time.  Skin: Skin is warm and dry.  Psychiatric: Her speech is normal. Judgment and thought content normal. Her mood appears pleasant, slightly anxious.     Assessment/Plan: 1. Functional deficits secondary to septic joints with  subsequent debilitation which require 3+ hours per day of interdisciplinary therapy in a comprehensive inpatient rehab setting. Physiatrist is providing close team supervision and 24 hour management of active medical problems listed below. Physiatrist and rehab team continue to assess barriers to discharge/monitor patient progress toward functional and medical goals.  Dc home tomorrow  FIM: FIM - Bathing Bathing Steps Patient Completed: Chest, Right Arm, Left Arm, Abdomen, Front perineal area, Right upper leg, Left upper leg, Buttocks, Left lower leg (including foot), Right lower leg (including foot) Bathing: 5: Supervision: Safety issues/verbal cues  FIM - Upper Body Dressing/Undressing Upper body dressing/undressing steps patient completed: Thread/unthread left sleeve of pullover shirt/dress, Thread/unthread right sleeve of pullover shirt/dresss, Put head through opening of pull over shirt/dress, Pull shirt over trunk Upper body dressing/undressing: 5: Supervision: Safety issues/verbal cues FIM - Lower Body Dressing/Undressing Lower body dressing/undressing steps patient completed: Thread/unthread right underwear leg, Pull underwear up/down, Thread/unthread right pants leg, Pull pants up/down, Fasten/unfasten pants, Thread/unthread left pants leg, Thread/unthread left underwear leg, Don/Doff left shoe, Don/Doff right shoe, Fasten/unfasten right shoe, Fasten/unfasten left shoe Lower body dressing/undressing: 5: Set-up assist to: Don/Doff TED stocking  FIM - Toileting Toileting steps completed by patient: Adjust clothing prior to toileting, Performs perineal hygiene, Adjust clothing after toileting Toileting Assistive Devices: Grab bar or rail for support Toileting: 5: Supervision: Safety issues/verbal cues  FIM - Radio producer Devices: Environmental consultant, Product manager Transfers: 5-To toilet/BSC: Supervision (verbal cues/safety issues), 5-From toilet/BSC:  Supervision  (verbal cues/safety issues)  FIM - Engineer, site Assistive Devices: Arm rests, Copy: 5: Bed > Chair or W/C: Supervision (verbal cues/safety issues)  FIM - Locomotion: Wheelchair Distance: 150 Locomotion: Wheelchair: 0: Activity did not occur FIM - Locomotion: Ambulation Locomotion: Ambulation Assistive Devices: Administrator Ambulation/Gait Assistance: 6: Modified independent (Device/Increase time) Locomotion: Ambulation: 5: Travels 150 ft or more with supervision/safety issues  Comprehension Comprehension Mode: Auditory Comprehension: 5-Follows basic conversation/direction: With no assist  Expression Expression Mode: Verbal Expression: 5-Expresses basic needs/ideas: With no assist  Social Interaction Social Interaction: 6-Interacts appropriately with others with medication or extra time (anti-anxiety, antidepressant).  Problem Solving Problem Solving Mode: Asleep Problem Solving: 5-Solves basic 90% of the time/requires cueing < 10% of the time  Memory Memory: 6-More than reasonable amt of time  Medical Problem List and Plan: 1. 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 DVT Prophylaxis/Anticoagulation: Pharmaceutical: Lovenox resumed without issues 3. Pain Management:   oxycodone 10 mg prn.   -anxiety mgt  -gabapentin for neuropathic pain  -encouraged ICE after activities    4. Adjustment reaction/ Mood: Team to provide ego support. Patient not interested in antidepressant at this time. LCSW to follow for evaluation and support.  5. Neuropsych: This patient is capable of making decisions on her own behalf. 6. Skin/Wound Care: Routine pressure relief measures. Elevate LUE when in bed  l 7. Fluids/Electrolytes/Nutrition: Needs 1200 FR due to ESRD.  8. ESRD: Schedule HD post therapy. Volume mgt/HD schedule per nephrology 9. Septic arthritis:  continue Ancef  thru  10/04/14 with HD  -cxr with chf, atelectasis----IS   -volume mgt per renal  10. DM type 2 with neuropathy: Continue to monitor BS ac/hs basis and use SSI for elevated BS. Off glipizide and currently diet controlled.   -sugars under control 11. HTN: Monitor every 8 hours. Continue Hydralazine bid.   LOS (Days) 15   A FACE TO FACE EVALUATION WAS PERFORMED  SWARTZ,ZACHARY T 09/29/2014 7:48 AM

## 2014-09-30 LAB — GLUCOSE, CAPILLARY
Glucose-Capillary: 103 mg/dL — ABNORMAL HIGH (ref 70–99)
Glucose-Capillary: 99 mg/dL (ref 70–99)

## 2014-09-30 MED ORDER — LIDOCAINE HCL (PF) 1 % IJ SOLN: 5.0000 mL | INTRAMUSCULAR | Status: DC | PRN

## 2014-09-30 MED ORDER — PENTAFLUOROPROP-TETRAFLUOROETH EX AERO: 1.0000 "application " | INHALATION_SPRAY | CUTANEOUS | Status: DC | PRN

## 2014-09-30 MED ORDER — ALTEPLASE 2 MG IJ SOLR
2.0000 mg | Freq: Once | INTRAMUSCULAR | Status: DC | PRN
Start: 2014-09-30 — End: 2014-09-30
  Filled 2014-09-30: qty 2

## 2014-09-30 MED ORDER — HEPARIN SODIUM (PORCINE) 1000 UNIT/ML DIALYSIS: 20.0000 [IU]/kg | INTRAMUSCULAR | Status: DC | PRN

## 2014-09-30 MED ORDER — SODIUM CHLORIDE 0.9 % IV SOLN: 100.0000 mL | INTRAVENOUS | Status: DC | PRN

## 2014-09-30 MED ORDER — LIDOCAINE-PRILOCAINE 2.5-2.5 % EX CREA: 1.0000 "application " | TOPICAL_CREAM | CUTANEOUS | Status: DC | PRN

## 2014-09-30 MED ORDER — HEPARIN SODIUM (PORCINE) 1000 UNIT/ML DIALYSIS: 1000.0000 [IU] | INTRAMUSCULAR | Status: DC | PRN

## 2014-09-30 MED ORDER — NEPRO/CARBSTEADY PO LIQD: 237.0000 mL | ORAL | Status: DC | PRN

## 2014-09-30 NOTE — Progress Notes (Signed)
Patient discharged to home with son and all belongings about 1350. Patient discharge instructions given yesterday. Patient given scheduled pain medicine. Patient denied any questions. Vitals stable.

## 2014-09-30 NOTE — Progress Notes (Signed)
[place] PHYSICAL MEDICINE & REHABILITATION     PROGRESS NOTE    Subjective/Complaints: In good spirits. Excited to go home today!!!. Pain manageable    Objective: Vital Signs: Blood pressure 181/95, pulse 111, temperature 98 F (36.7 C), temperature source Oral, resp. rate 18, height 5\' 7"  (1.702 m), weight 75.2 kg (165 lb 12.6 oz), last menstrual period 03/16/2002, SpO2 99 %. No results found.  Recent Labs  09/28/14 2241  WBC 7.6  HGB 8.6*  HCT 27.4*  PLT 310    Recent Labs  09/28/14 2241  NA 134*  K 3.6  CL 94*  GLUCOSE 119*  BUN 20  CREATININE 5.02*  CALCIUM 8.0*   CBG (last 3)   Recent Labs  09/29/14 1615 09/29/14 2059 09/30/14 0654  GLUCAP 137* 103* 103*    Wt Readings from Last 3 Encounters:  09/30/14 75.2 kg (165 lb 12.6 oz)  09/14/14 92.2 kg (203 lb 4.2 oz)  08/08/14 89.812 kg (198 lb)    Physical Exam:  Constitutional: She is oriented to person, place, and time. She appears well-developed and well-nourished.  HENT:  Head: Normocephalic and atraumatic.  Eyes: Conjunctivae are normal. Pupils are equal, round, and reactive to light.  Neck: Normal range of motion. Neck supple.  Cardiovascular: Normal rate, regular rhythm and normal heart sounds.  Respiratory: Effort normal and breath sounds normal.  GI: Soft. Bowel sounds are normal.  Musculoskeletal: She exhibits continued edema in lower exts (1+ edema L-forearm with less tenderness). , No pain with left knee ROM H bilateral knees with edema L>R. Left knee still warm--can do full knee extension however . Moves RUE without difficulty. LUE much less tender along AVG site. Mild edema and warmth noted , good thrill Neurological: She is alert and oriented to person, place, and time.  Skin: Skin is warm and dry.  Psychiatric: Her speech is normal. Judgment and thought content normal. Her mood appears pleasant, slightly anxious.     Assessment/Plan: 1. Functional deficits secondary to  septic joints with subsequent debilitation which require 3+ hours per day of interdisciplinary therapy in a comprehensive inpatient rehab setting. Physiatrist is providing close team supervision and 24 hour management of active medical problems listed below. Physiatrist and rehab team continue to assess barriers to discharge/monitor patient progress toward functional and medical goals.  Dc home today after HD this morning  FIM: FIM - Bathing Bathing Steps Patient Completed: Chest, Right Arm, Left Arm, Abdomen, Front perineal area, Right upper leg, Left upper leg, Buttocks, Left lower leg (including foot), Right lower leg (including foot) (A to don cover for HD site) Bathing: 5: Supervision: Safety issues/verbal cues  FIM - Upper Body Dressing/Undressing Upper body dressing/undressing steps patient completed: Thread/unthread left sleeve of pullover shirt/dress, Thread/unthread right sleeve of pullover shirt/dresss, Put head through opening of pull over shirt/dress, Pull shirt over trunk Upper body dressing/undressing: 7: Complete Independence: No helper FIM - Lower Body Dressing/Undressing Lower body dressing/undressing steps patient completed: Thread/unthread right underwear leg, Pull underwear up/down, Thread/unthread right pants leg, Pull pants up/down, Fasten/unfasten pants, Thread/unthread left pants leg, Thread/unthread left underwear leg, Don/Doff left shoe, Don/Doff right shoe, Fasten/unfasten right shoe, Fasten/unfasten left shoe Lower body dressing/undressing: 5: Set-up assist to: Don/Doff TED stocking  FIM - Toileting Toileting steps completed by patient: Adjust clothing prior to toileting, Performs perineal hygiene, Adjust clothing after toileting Toileting Assistive Devices: Grab bar or rail for support Toileting: 6: More than reasonable amount of time  FIM - Media planner  Assistive Devices: Environmental consultant, Product manager Transfers: 6-More than reasonable amt of  time, 6-Assistive device: No helper  FIM - Control and instrumentation engineer Devices: Arm rests, Copy: 6: More than reasonable amt of time  FIM - Locomotion: Wheelchair Distance: 150 Locomotion: Wheelchair: 0: Activity did not occur (pt ambulatory) FIM - Locomotion: Ambulation Locomotion: Ambulation Assistive Devices: Administrator Ambulation/Gait Assistance: 6: Modified independent (Device/Increase time) Locomotion: Ambulation: 6: Travels 150 ft or more with assistive device/no helper  Comprehension Comprehension Mode: Auditory Comprehension: 5-Understands complex 90% of the time/Cues < 10% of the time  Expression Expression Mode: Verbal Expression: 5-Expresses basic 90% of the time/requires cueing < 10% of the time.  Social Interaction Social Interaction: 6-Interacts appropriately with others with medication or extra time (anti-anxiety, antidepressant).  Problem Solving Problem Solving Mode: Asleep Problem Solving: 5-Solves complex 90% of the time/cues < 10% of the time  Memory Memory: 6-More than reasonable amt of time  Medical Problem List and Plan: 1. 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 DVT Prophylaxis/Anticoagulation: Pharmaceutical: Lovenox resumed without issues 3. Pain Management:   oxycodone 10 mg prn.   -anxiety mgt  -gabapentin for neuropathic pain  -encouraged ICE after activities    4. Adjustment reaction/ Mood: Team to provide ego support. Patient not interested in antidepressant at this time. LCSW to follow for evaluation and support.  5. Neuropsych: This patient is capable of making decisions on her own behalf. 6. Skin/Wound Care: Routine pressure relief measures. Elevate LUE when in bed  l 7. Fluids/Electrolytes/Nutrition: Needs 1200 FR due to ESRD.  8. ESRD: Schedule HD post therapy. Volume mgt/HD schedule per nephrology 9. Septic arthritis:   continue Ancef  thru 10/04/14 with HD  -cxr with chf, atelectasis----IS   -volume mgt per renal  10. DM type 2 with neuropathy: Continue to monitor BS ac/hs basis and use SSI for elevated BS. Off glipizide and currently diet controlled.   -sugars under better control 11. HTN: Monitor every 8 hours. Continue Hydralazine bid.   LOS (Days) 16   A FACE TO FACE EVALUATION WAS PERFORMED  SWARTZ,ZACHARY T 09/30/2014 9:21 AM

## 2014-10-10 ENCOUNTER — Inpatient Hospital Stay: Payer: Self-pay | Admitting: Internal Medicine

## 2014-10-16 DIAGNOSIS — Z48812 Encounter for surgical aftercare following surgery on the circulatory system: Secondary | ICD-10-CM

## 2014-10-16 DIAGNOSIS — I120 Hypertensive chronic kidney disease with stage 5 chronic kidney disease or end stage renal disease: Secondary | ICD-10-CM

## 2014-10-16 DIAGNOSIS — Z452 Encounter for adjustment and management of vascular access device: Secondary | ICD-10-CM

## 2014-10-16 DIAGNOSIS — G629 Polyneuropathy, unspecified: Secondary | ICD-10-CM

## 2014-10-16 DIAGNOSIS — E119 Type 2 diabetes mellitus without complications: Secondary | ICD-10-CM

## 2014-10-16 DIAGNOSIS — N186 End stage renal disease: Secondary | ICD-10-CM

## 2014-10-16 DIAGNOSIS — Z992 Dependence on renal dialysis: Secondary | ICD-10-CM

## 2014-10-19 ENCOUNTER — Inpatient Hospital Stay: Payer: Self-pay | Admitting: Internal Medicine

## 2014-11-07 ENCOUNTER — Encounter: Payer: Self-pay | Admitting: Internal Medicine

## 2014-11-07 ENCOUNTER — Ambulatory Visit (INDEPENDENT_AMBULATORY_CARE_PROVIDER_SITE_OTHER): Payer: Self-pay | Admitting: Internal Medicine

## 2014-11-07 VITALS — BP 172/83 | HR 108 | Temp 98.9°F | Wt 157.0 lb

## 2014-11-07 DIAGNOSIS — M0230 Reiter's disease, unspecified site: Secondary | ICD-10-CM

## 2014-11-07 DIAGNOSIS — B182 Chronic viral hepatitis C: Secondary | ICD-10-CM

## 2014-11-07 DIAGNOSIS — R7881 Bacteremia: Secondary | ICD-10-CM

## 2014-11-07 DIAGNOSIS — B9561 Methicillin susceptible Staphylococcus aureus infection as the cause of diseases classified elsewhere: Secondary | ICD-10-CM

## 2014-11-07 DIAGNOSIS — A4901 Methicillin susceptible Staphylococcus aureus infection, unspecified site: Secondary | ICD-10-CM

## 2014-11-07 DIAGNOSIS — Z23 Encounter for immunization: Secondary | ICD-10-CM

## 2014-11-07 LAB — COMPLETE METABOLIC PANEL WITH GFR
ALT: 22 U/L (ref 0–35)
AST: 14 U/L (ref 0–37)
Albumin: 3.2 g/dL — ABNORMAL LOW (ref 3.5–5.2)
Alkaline Phosphatase: 121 U/L — ABNORMAL HIGH (ref 39–117)
BUN: 27 mg/dL — ABNORMAL HIGH (ref 6–23)
CO2: 30 mEq/L (ref 19–32)
Calcium: 9 mg/dL (ref 8.4–10.5)
Chloride: 98 mEq/L (ref 96–112)
Creat: 2.08 mg/dL — ABNORMAL HIGH (ref 0.50–1.10)
GFR, Est African American: 29 mL/min — ABNORMAL LOW
GFR, Est Non African American: 25 mL/min — ABNORMAL LOW
Glucose, Bld: 142 mg/dL — ABNORMAL HIGH (ref 70–99)
Potassium: 4.2 mEq/L (ref 3.5–5.3)
Sodium: 141 mEq/L (ref 135–145)
Total Bilirubin: 0.5 mg/dL (ref 0.2–1.2)
Total Protein: 8 g/dL (ref 6.0–8.3)

## 2014-11-07 NOTE — Progress Notes (Signed)
Subjective:    [name], female    DOB: 05-24-1956, 59 y.o.   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 No hx of IVDU. Since prolonged hospitalization and rehab she has been back at home for hte last 4 wks. Off of abtx for last 3-4 wk. Has been using walker. Noticing some pain to left knee. Seen by dr. [name] today who felt needed pt. Not thought to have ongoing infection. Still undergoing physical therapy at home.  Current Outpatient Prescriptions on File Prior to Visit  Medication Sig Dispense Refill  . aspirin 81 MG chewable tablet Chew 81-162 mg by mouth daily as needed (For stroke prevention.).     [name] Blood Glucose Monitoring Suppl (ON CALL EXPRESS GLUCOSE METER) DEVI Use as directed 1 Device 0  . calcium carbonate (TUMS E-X 750) 750 MG chewable tablet Chew 1 tablet (750 mg total) by mouth daily. With your largest meal    . carbamide peroxide (DEBROX) 6.5 % otic solution Place 5 drops into both ears 2 (two) times daily. 15 mL 0  . cyclobenzaprine (FLEXERIL) 5 MG tablet Take 1 tablet (5 mg total) by mouth 2 (two) times daily as needed for muscle spasms. 60 tablet 0  . diphenhydrAMINE (BENADRYL) 25 mg capsule Take 1 capsule (25 mg total) by mouth every 6 (six) hours as needed for itching. 30 capsule 0  . famotidine (PEPCID) 20 MG tablet Take 1 tablet (20 mg total) by mouth daily. 30 tablet 1  . fluticasone (FLONASE) 50 MCG/ACT nasal spray Place 1 spray into both nostrils daily.  2  . gabapentin (NEURONTIN) 100 MG capsule Take 1 capsule (100 mg total) by mouth 2 (two) times daily. 60 capsule 1  . glucose blood test strip Use as instructed 100 each 12  . hydrocerin (EUCERIN) CREA Apply 1 application topically 2 (two) times daily. To dry skin on BLE 454 g 0  . multivitamin (RENA-VIT) TABS tablet Take 1 tablet by mouth at bedtime. 30 tablet 1  . nicotine (NICODERM CQ - DOSED IN  MG/24 HOURS) 21 mg/24hr patch Place 1 patch (21 mg total) onto the skin daily. 28 patch 0  . oxyCODONE 10 MG TABS Take 1 tablet (10 mg total) by mouth every 6 (six) hours as needed for severe pain. 60 tablet 0  . polyethylene glycol (MIRALAX / GLYCOLAX) packet Take 17 g by mouth 2 (two) times daily. 60 each 1  . zolpidem (AMBIEN) 5 MG tablet Take 1 tablet (5 mg total) by mouth at bedtime as needed for sleep. 30 tablet 0   No current facility-administered medications on file prior to visit.   Active Ambulatory Problems    Diagnosis Date Noted  . Chest pain at rest 10/18/2011  . Shortness of breath 10/18/2011  . Uncontrolled hypertension 02/18/2013  . Type II or unspecified type diabetes mellitus without mention of complication, uncontrolled 02/18/2013  . BP (high blood pressure) 06/24/2013  . Disorder of peripheral nervous system 06/24/2013  . 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 Diabetes mellitus, type 2 06/24/2013  . Tachycardia 08/07/2014  . Septic joint of left shoulder region 08/07/2014  . Sinus tachycardia 08/07/2014  . Staphylococcus aureus bacteremia   . Septic joint of right knee joint   . ESRD (end stage renal disease) on dialysis   . Septic joint of right hand   . SIRS (  systemic inflammatory response syndrome)   . Benign essential HTN   . Hepatitis C 08/16/2014  . Acute renal failure   . Acute renal failure syndrome   . Hep C w/ coma, chronic   . Joint infection   . Physical debility 09/14/2014   Resolved Ambulatory Problems    Diagnosis Date Noted  . No Resolved Ambulatory Problems   Past Medical History  Diagnosis Date  . Diabetes mellitus   . Hypertension   . Peripheral neuropathy       Review of Systems +left knee pain and swelling. 10 point ros is negative    Objective:   Physical Exam BP 172/83 mmHg  Pulse 108  Temp(Src) 98.9 F (37.2 C) (Oral)  Wt 157 lb (71.215 kg)  LMP 03/16/2002 Physical Exam  Constitutional:  oriented to person,  place, and time. appears well-developed and well-nourished. No distress.  HENT: [place]/AT, PERRLA, no scleral icterus Mouth/Throat: Oropharynx is clear and moist. No oropharyngeal exudate.  Cardiovascular: Normal rate, regular rhythm and normal heart sounds. Exam reveals no gallop and no friction rub.  No murmur heard.  Pulmonary/Chest: Effort normal and breath sounds normal. No respiratory distress.  has no wheezes.  Ext = left knee slightly swollen. Warm to touch  Skin: Skin is warm and dry. No rash noted. No erythema.  Psychiatric: a normal mood and affect.  behavior is normal.         Assessment & Plan:  Chronic hep c without hepatic coma = will ask her to get elastography then   Will check hep a ab, and cmp today. Will start hep b imms  Disseminated mssa infection = she had prolonged abtx, finished course of therapy  Possible reactive arthritis = left knee. Will see back in 4 wk. If worsens, asked her to see dr. [name] for evaluation  rtc in 4 wk

## 2014-11-08 LAB — HEPATITIS A ANTIBODY, TOTAL: Hep A Total Ab: REACTIVE — AB

## 2014-11-14 ENCOUNTER — Ambulatory Visit: Payer: Self-pay | Attending: Internal Medicine | Admitting: Internal Medicine

## 2014-11-14 ENCOUNTER — Encounter: Payer: Self-pay | Admitting: Internal Medicine

## 2014-11-14 VITALS — BP 152/91 | HR 102 | Temp 98.1°F | Resp 18 | Ht 67.0 in | Wt 155.0 lb

## 2014-11-14 DIAGNOSIS — N186 End stage renal disease: Secondary | ICD-10-CM

## 2014-11-14 DIAGNOSIS — E1142 Type 2 diabetes mellitus with diabetic polyneuropathy: Secondary | ICD-10-CM

## 2014-11-14 DIAGNOSIS — I10 Essential (primary) hypertension: Secondary | ICD-10-CM

## 2014-11-14 DIAGNOSIS — M25462 Effusion, left knee: Secondary | ICD-10-CM

## 2014-11-14 DIAGNOSIS — Z992 Dependence on renal dialysis: Secondary | ICD-10-CM

## 2014-11-14 LAB — POCT GLYCOSYLATED HEMOGLOBIN (HGB A1C): Hemoglobin A1C: 6.3

## 2014-11-14 LAB — GLUCOSE, POCT (MANUAL RESULT ENTRY): POC Glucose: 70 mg/dl (ref 70–99)

## 2014-11-14 MED ORDER — INSULIN GLARGINE 100 UNIT/ML SOLOSTAR PEN: 5.0000 [IU] | PEN_INJECTOR | Freq: Every day | SUBCUTANEOUS | Status: DC

## 2014-11-14 NOTE — Patient Instructions (Addendum)
Please make sure you look out for call from [facility] specialist. Find out about Medicaid approval asap.  Do not take glipizide any longer. Begin on Lantus insulin with 5 units per night. This is a once a day medication. Please check your blood sugar often and write it down for me to review in 2 weeks.   If you begin to experience more pain, fever, chills, swelling of knee please call ortho asap. I recommend you go back for evaluation of knee. I do feel like a infection may be present.

## 2014-11-14 NOTE — Progress Notes (Signed)
[name], female   DOB: 03/14/1956, 59 y.o.   MRN: [id number]  CC: DM f/u, HFU  HPI: Brandy Houston is a 59 y.o. female here today for a follow up visit.  Patient has past medical history of diabetes mellitus type 2, hypertension, neuropathy, Hep C, and ESRD on HD.  Patient was admitted into the hospital on 3/31 for septic arthritis and was found to have Hep C as well. She reports that she ultimately went into renal failure and began dialysis while inpatient. She has been on outpatient dialysis for one month. She reports that she has not established with a Nephrologist. She has a left fistula and reports that she was not told how long she will require dialysis. She in the process of getting clearance for Hep C treatment with Infectious Disease.  Patient is unable to explain blood pressure while on dialysis. She reports that she has not taken her second blood dose of blood pressure medication today. She reports that her blood pressures have been high so she went to the pharmacy to pick up a old prescription of hydralazine 25 mg TID last week.  She states that she was seen by her Orthopedics last week and was told that her left knee swelling was due to scar tissue and not infection. She has been referred to physical therapy.   Patient has No headache, No chest pain, No abdominal pain - No Nausea, No new weakness tingling or numbness, No Cough - SOB.  Allergies  Allergen Reactions  . Compazine [Prochlorperazine] Shortness Of Breath and Swelling    TONGUE SWELLS  . Omnipaque [Iohexol] Hives  . Shellfish-Derived Products Anaphylaxis  . Iodinated Diagnostic Agents Rash   Past Medical History  Diagnosis Date  . Diabetes mellitus   . Hypertension   . Peripheral neuropathy   . Hepatitis C 2016   Current Outpatient Prescriptions on File Prior to Visit  Medication Sig Dispense Refill  . aspirin 81 MG chewable tablet Chew 81-162 mg by mouth daily as needed (For stroke prevention.).     [name]  Blood Glucose Monitoring Suppl (ON CALL EXPRESS GLUCOSE METER) DEVI Use as directed 1 Device 0  . calcium carbonate (TUMS E-X 750) 750 MG chewable tablet Chew 1 tablet (750 mg total) by mouth daily. With your largest meal    . cyclobenzaprine (FLEXERIL) 5 MG tablet Take 1 tablet (5 mg total) by mouth 2 (two) times daily as needed for muscle spasms. 60 tablet 0  . diphenhydrAMINE (BENADRYL) 25 mg capsule Take 1 capsule (25 mg total) by mouth every 6 (six) hours as needed for itching. 30 capsule 0  . gabapentin (NEURONTIN) 100 MG capsule Take 1 capsule (100 mg total) by mouth 2 (two) times daily. 60 capsule 1  . glucose blood test strip Use as instructed 100 each 12  . zolpidem (AMBIEN) 5 MG tablet Take 1 tablet (5 mg total) by mouth at bedtime as needed for sleep. 30 tablet 0  . carbamide peroxide (DEBROX) 6.5 % otic solution Place 5 drops into both ears 2 (two) times daily. (Patient not taking: Reported on 11/14/2014) 15 mL 0  . famotidine (PEPCID) 20 MG tablet Take 1 tablet (20 mg total) by mouth daily. (Patient not taking: Reported on 11/14/2014) 30 tablet 1  . fluticasone (FLONASE) 50 MCG/ACT nasal spray Place 1 spray into both nostrils daily. (Patient not taking: Reported on 11/14/2014)  2  . hydrocerin (EUCERIN) CREA Apply 1 application topically 2 (two) times daily. To  dry skin on BLE (Patient not taking: Reported on 11/14/2014) 454 g 0  . multivitamin (RENA-VIT) TABS tablet Take 1 tablet by mouth at bedtime. (Patient not taking: Reported on 11/14/2014) 30 tablet 1  . nicotine (NICODERM CQ - DOSED IN MG/24 HOURS) 21 mg/24hr patch Place 1 patch (21 mg total) onto the skin daily. (Patient not taking: Reported on 11/14/2014) 28 patch 0  . oxyCODONE 10 MG TABS Take 1 tablet (10 mg total) by mouth every 6 (six) hours as needed for severe pain. (Patient not taking: Reported on 11/14/2014) 60 tablet 0  . polyethylene glycol (MIRALAX / GLYCOLAX) packet Take 17 g by mouth 2 (two) times daily. (Patient not  taking: Reported on 11/14/2014) 60 each 1   No current facility-administered medications on file prior to visit.   Family History  Problem Relation Age of Onset  . Cancer Mother   . Heart disease Father   . Cancer Sister   . Cancer Brother   . Diabetes Brother    History   Social History  . Marital Status: Single    Spouse Name: N/A  . Number of Children: N/A  . Years of Education: N/A   Occupational History  . Not on file.   Social History Main Topics  . Smoking status: Current Every Day Smoker -- 0.50 packs/day for 20 years  . Smokeless tobacco: Never Used  . Alcohol Use: Yes     Comment: ocassionally  . Drug Use: No  . Sexual Activity: No   Other Topics Concern  . Not on file   Social History Narrative    Review of Systems: See HPI  Objective:   Filed Vitals:   11/14/14 1409  BP: 152/91  Pulse: 102  Temp: 98.1 F (36.7 C)  Resp: 18   Physical Exam  Constitutional: She is oriented to person, place, and time.  Cardiovascular: Normal rate, regular rhythm and normal heart sounds.   Pulmonary/Chest: Effort normal and breath sounds normal.  Abdominal: Soft. Bowel sounds are normal.  Musculoskeletal: She exhibits edema (Left knee swollen and warm to touch. Tender to touch) and tenderness.  Effusion versus continued joint infection   Neurological: She is alert and oriented to person, place, and time.  Skin: Skin is warm. No erythema.     Lab Results  Component Value Date   WBC 7.6 09/28/2014   HGB 8.6* 09/28/2014   HCT 27.4* 09/28/2014   MCV 91.6 09/28/2014   PLT 310 09/28/2014   Lab Results  Component Value Date   CREATININE 2.08* 11/07/2014   BUN 27* 11/07/2014   NA 141 11/07/2014   K 4.2 11/07/2014   CL 98 11/07/2014   CO2 30 11/07/2014    Lab Results  Component Value Date   HGBA1C 8.4* 08/09/2014   Lipid Panel     Component Value Date/Time   CHOL 163 02/21/2013 1051   TRIG 150* 02/21/2013 1051   HDL 73 02/21/2013 1051   CHOLHDL  2.2 02/21/2013 1051   VLDL 30 02/21/2013 1051   LDLCALC 60 02/21/2013 1051       Assessment and plan:   Brandy Houston was seen today for hospitalization follow-up and diabetes.  Diagnoses and all orders for this visit:  Type 2 diabetes mellitus with diabetic polyneuropathy Orders: -     Glucose (CBG) -     HgB A1c -     Microalbumin, urine -     Lipid panel; Future Patient was continuing to take Glipizide after hospital discharge.  Will d/c glipizide due to hepatic and renal concerns and switch to lose dose Lantus 5 units nightly.   ESRD (end stage renal disease) on dialysis Orders: -     Ambulatory referral to Nephrology (STAT) Upon review of the charts it reveals a creat of 2.08 and a GFR of 29.  Benign essential HTN I will need a accurate picture of patients pressure and on days that she has dialysis before changes are made to regimen.  Will send patient with letter for a record of BP on HD days  Review of discharge summary reveals that patient was discharged from hospital with no BP medication. Summary reports that patient was suppose to stop hydralazine on discharge.  I will let patient continue current dose of hydralazine and reassess at nurse visit--may need increase at that time. Will seek further assistance from Nephrology once she gets in.   Knee effusion, left Orders: -     CBC with Differential; Future Left knee effusion versus joint infection Explained signs and symptoms that should warrant immediate attention.  Patient verbalized understanding with teach back used.  >50% of visit was spent reviewing hospital course and medication changes  Return in about 2 weeks (around 11/28/2014) for Nurse Visit-cbg log/BP and 3 mo PCP .    Chari Manning, NP-C [facility] [phone] 11/14/2014, 2:22 PM

## 2014-11-14 NOTE — Progress Notes (Signed)
HFU Shoulder pain,  infection on bones F/U DM

## 2014-11-15 DIAGNOSIS — D509 Iron deficiency anemia, unspecified: Secondary | ICD-10-CM | POA: Diagnosis not present

## 2014-11-15 DIAGNOSIS — N186 End stage renal disease: Secondary | ICD-10-CM | POA: Diagnosis not present

## 2014-11-15 LAB — MICROALBUMIN, URINE: Microalb, Ur: 266.7 mg/dL — ABNORMAL HIGH (ref <2.0)

## 2014-11-17 DIAGNOSIS — D509 Iron deficiency anemia, unspecified: Secondary | ICD-10-CM | POA: Diagnosis not present

## 2014-11-17 DIAGNOSIS — N186 End stage renal disease: Secondary | ICD-10-CM | POA: Diagnosis not present

## 2014-11-20 DIAGNOSIS — D509 Iron deficiency anemia, unspecified: Secondary | ICD-10-CM | POA: Diagnosis not present

## 2014-11-20 DIAGNOSIS — N186 End stage renal disease: Secondary | ICD-10-CM | POA: Diagnosis not present

## 2014-11-21 ENCOUNTER — Telehealth: Payer: Self-pay | Admitting: *Deleted

## 2014-11-21 NOTE — Telephone Encounter (Signed)
Dialysis nurse ask if they could continue to give the patient her Hep B series as if we do it will give her false positives in her labs there. Faxed her the date of the first injecton and advised will let the doctor know. Asked her if they could just send [place] confirmation of the last 2 for out records and I am sure Dr Baxter Flattery will not mind as long as the patient gets them.

## 2014-11-22 DIAGNOSIS — D509 Iron deficiency anemia, unspecified: Secondary | ICD-10-CM | POA: Diagnosis not present

## 2014-11-22 DIAGNOSIS — N186 End stage renal disease: Secondary | ICD-10-CM | POA: Diagnosis not present

## 2014-11-24 ENCOUNTER — Encounter: Payer: Self-pay | Admitting: Internal Medicine

## 2014-11-24 DIAGNOSIS — N186 End stage renal disease: Secondary | ICD-10-CM | POA: Diagnosis not present

## 2014-11-24 DIAGNOSIS — D509 Iron deficiency anemia, unspecified: Secondary | ICD-10-CM | POA: Diagnosis not present

## 2014-11-24 NOTE — Progress Notes (Signed)
[name], female   DOB: [birth year], 59 y.o.   MRN: [id number]  Received notes from [facility] dialysis center where patient has a rounding physician. Review of records reveal that patient's BP has been 170's/90-100's. Blood work has been completed. Provider is Arvin Collard, MD

## 2014-11-27 DIAGNOSIS — N186 End stage renal disease: Secondary | ICD-10-CM | POA: Diagnosis not present

## 2014-11-27 DIAGNOSIS — D509 Iron deficiency anemia, unspecified: Secondary | ICD-10-CM | POA: Diagnosis not present

## 2014-11-29 DIAGNOSIS — D509 Iron deficiency anemia, unspecified: Secondary | ICD-10-CM | POA: Diagnosis not present

## 2014-11-29 DIAGNOSIS — N186 End stage renal disease: Secondary | ICD-10-CM | POA: Diagnosis not present

## 2014-12-01 DIAGNOSIS — N186 End stage renal disease: Secondary | ICD-10-CM | POA: Diagnosis not present

## 2014-12-01 DIAGNOSIS — D509 Iron deficiency anemia, unspecified: Secondary | ICD-10-CM | POA: Diagnosis not present

## 2014-12-04 DIAGNOSIS — N186 End stage renal disease: Secondary | ICD-10-CM | POA: Diagnosis not present

## 2014-12-04 DIAGNOSIS — D509 Iron deficiency anemia, unspecified: Secondary | ICD-10-CM | POA: Diagnosis not present

## 2014-12-06 ENCOUNTER — Ambulatory Visit: Payer: Medicaid Other | Attending: Internal Medicine | Admitting: *Deleted

## 2014-12-06 VITALS — BP 156/74 | HR 98 | Temp 98.6°F | Resp 20 | Ht 67.0 in | Wt 155.8 lb

## 2014-12-06 DIAGNOSIS — Z794 Long term (current) use of insulin: Secondary | ICD-10-CM | POA: Insufficient documentation

## 2014-12-06 DIAGNOSIS — D509 Iron deficiency anemia, unspecified: Secondary | ICD-10-CM | POA: Diagnosis not present

## 2014-12-06 DIAGNOSIS — Z720 Tobacco use: Secondary | ICD-10-CM | POA: Insufficient documentation

## 2014-12-06 DIAGNOSIS — I10 Essential (primary) hypertension: Secondary | ICD-10-CM | POA: Diagnosis not present

## 2014-12-06 DIAGNOSIS — N186 End stage renal disease: Secondary | ICD-10-CM | POA: Diagnosis not present

## 2014-12-06 DIAGNOSIS — E1165 Type 2 diabetes mellitus with hyperglycemia: Secondary | ICD-10-CM | POA: Diagnosis present

## 2014-12-06 LAB — GLUCOSE, POCT (MANUAL RESULT ENTRY): POC Glucose: 141 mg/dl — AB (ref 70–99)

## 2014-12-06 MED ORDER — GABAPENTIN 100 MG PO CAPS: 100.0000 mg | ORAL_CAPSULE | Freq: Two times a day (BID) | ORAL | Status: DC

## 2014-12-06 MED ORDER — HYDRALAZINE HCL 25 MG PO TABS: 25.0000 mg | ORAL_TABLET | Freq: Three times a day (TID) | ORAL | Status: DC

## 2014-12-06 NOTE — Progress Notes (Signed)
Patient presents for BP check, CBG and record review for T2DM Med list reviewed; patient reports taking lantus 5 units nightly and hydralazine 25 mg tid Patient's AM fasting blood sugars ranging 101-172 Patient is checking blood sugars directly after dinner. Instructed to check BS prior to dinner. If checking after meal, wait at least 2 hours     Denies increased thirst and urination, blurred vision Receiving dialysis M W F States always feels tired after dialysis C/o headache; rates 6/10 at present. Took 400 mg ibuprofen 3 hours ago States she also takes Aleve at times for left knee pain Discussed stopping all NSAIDS (ibuprofen, Motrin, Advil, naproxen, Aleve) due to potential effect on BP and kidneys. Patient made aware she can take acetaminophen/tylenol for pain relief. PCP made aware C/o L > R calf/ankle "tightness" X 4 weeks. No LE edema noted.  Both calves measured at 34.3 cm   CBG 141 2 hours after meal  Lab Results  Component Value Date   HGBA1C 6.3 11/14/2014   Filed Vitals:   12/06/14 1548  BP: 156/74  Pulse: 98  Temp: 98.6 F (37 C)  Resp: 20    Patient took hydralazine 25 mg 10 hours ago. Did not take noon dose after dialysis due to fear BP would drop  Per PCP: No changes to lantus or hydralazine at this time  Stop NSAIDS  Patient aware that she is to f/u with PCP 3 months from last visit. Due 02/14/2015

## 2014-12-08 DIAGNOSIS — N186 End stage renal disease: Secondary | ICD-10-CM | POA: Diagnosis not present

## 2014-12-08 DIAGNOSIS — D509 Iron deficiency anemia, unspecified: Secondary | ICD-10-CM | POA: Diagnosis not present

## 2014-12-11 DIAGNOSIS — D509 Iron deficiency anemia, unspecified: Secondary | ICD-10-CM | POA: Diagnosis not present

## 2014-12-11 DIAGNOSIS — N186 End stage renal disease: Secondary | ICD-10-CM | POA: Diagnosis not present

## 2014-12-12 ENCOUNTER — Encounter: Payer: Self-pay | Admitting: Internal Medicine

## 2014-12-12 ENCOUNTER — Ambulatory Visit (INDEPENDENT_AMBULATORY_CARE_PROVIDER_SITE_OTHER): Payer: Medicaid Other | Admitting: Internal Medicine

## 2014-12-12 VITALS — BP 165/92 | HR 99 | Temp 98.1°F | Ht 66.0 in | Wt 160.0 lb

## 2014-12-12 DIAGNOSIS — B182 Chronic viral hepatitis C: Secondary | ICD-10-CM

## 2014-12-12 DIAGNOSIS — Z23 Encounter for immunization: Secondary | ICD-10-CM | POA: Diagnosis not present

## 2014-12-12 DIAGNOSIS — M0230 Reiter's disease, unspecified site: Secondary | ICD-10-CM

## 2014-12-12 NOTE — Progress Notes (Signed)
Subjective:    [name], female    DOB: 03-Aug-1955, 59 y.o.   MRN: [id number]  HPI  59yo F with HTN, DM2 who was hospitalized in feb-march 2016 with sepsis due to MSSA bacteremia, with septic arhritis to bilateral knee and shoulder s/p I x D, but also c/b AKI now on HD M-W-F via left arm fistula. She states that she is gaining some kidney function back and nephrology contemplating twice a week dialysis. Her left knee occasionally causes her discomfort due to swelling. She denies fever chills ngithsweats, left knee occasionally swollen. No diarrhea, n/v, other arthralgias.  Current Outpatient Prescriptions on File Prior to Visit  Medication Sig Dispense Refill  . aspirin 81 MG chewable tablet Chew 81-162 mg by mouth daily as needed (For stroke prevention.).     [name] Blood Glucose Monitoring Suppl (ON CALL EXPRESS GLUCOSE METER) DEVI Use as directed 1 Device 0  . calcium carbonate (TUMS E-X 750) 750 MG chewable tablet Chew 1 tablet (750 mg total) by mouth daily. With your largest meal    . diphenhydrAMINE (BENADRYL) 25 mg capsule Take 1 capsule (25 mg total) by mouth every 6 (six) hours as needed for itching. 30 capsule 0  . gabapentin (NEURONTIN) 100 MG capsule Take 1 capsule (100 mg total) by mouth 2 (two) times daily. 60 capsule 2  . glucose blood test strip Use as instructed 100 each 12  . hydrALAZINE (APRESOLINE) 25 MG tablet Take 1 tablet (25 mg total) by mouth 3 (three) times daily. 90 tablet 2  . Insulin Glargine (LANTUS SOLOSTAR) 100 UNIT/ML Solostar Pen Inject 5 Units into the skin daily at 10 pm. 5 pen 3  . multivitamin (RENA-VIT) TABS tablet Take 1 tablet by mouth at bedtime. 30 tablet 1   No current facility-administered medications on file prior to visit.   Active Ambulatory Problems    Diagnosis Date Noted  . Chest pain at rest 10/18/2011  . Shortness of breath 10/18/2011  . Uncontrolled hypertension 02/18/2013  . Type II or unspecified type diabetes mellitus  without mention of complication, uncontrolled 02/18/2013  . BP (high blood pressure) 06/24/2013  . Disorder of peripheral nervous system 06/24/2013  . 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 Diabetes mellitus, type 2 06/24/2013  . Tachycardia 08/07/2014  . Septic joint of left shoulder region 08/07/2014  . Sinus tachycardia 08/07/2014  . Staphylococcus aureus bacteremia   . Septic joint of right knee joint   . ESRD (end stage renal disease) on dialysis   . Septic joint of right hand   . SIRS (systemic inflammatory response syndrome)   . Benign essential HTN   . Hepatitis C 08/16/2014  . Acute renal failure   . Acute renal failure syndrome   . Hep C w/ coma, chronic   . Joint infection   . Physical debility 09/14/2014   Resolved Ambulatory Problems    Diagnosis Date Noted  . No Resolved Ambulatory Problems   Past Medical History  Diagnosis Date  . Diabetes mellitus   . Hypertension   . Peripheral neuropathy      Review of Systems 10 point ros is negative    Objective:   Physical Exam  BP 165/92 mmHg  Pulse 99  Temp(Src) 98.1 F (36.7 C) (Oral)  Ht 5\' 6"  (1.676 m)  Wt 160 lb (72.576 kg)  BMI 25.84 kg/m2  LMP 03/16/2002 Physical Exam  Constitutional:  oriented to person, place, and time. appears well-developed and well-nourished. No  distress.  HENT: [place]/AT, PERRLA, no scleral icterus Mouth/Throat: Oropharynx is clear and moist. No oropharyngeal exudate.  Cardiovascular: Normal rate, regular rhythm and normal heart sounds. Exam reveals no gallop and no friction rub.  No murmur heard.  Pulmonary/Chest: Effort normal and breath sounds normal. No respiratory distress.  has no wheezes.  Neck = supple, no nuchal rigidity Abdominal: Soft. Bowel sounds are normal.  exhibits no distension. There is no tenderness.  Lymphadenopathy: no cervical adenopathy. No axillary adenopathy Ext: left fore arm fistula + thrill. Left knee warm to touch with slight effusion Skin:  Skin is warm and dry. No rash noted. No erythema.  Psychiatric: a normal mood and affect.  behavior is normal.       Assessment & Plan:  Chronic hepatitis C without hepatitc coma =Will check NS5A resistance to see if we can do a ribavarin sparing regimen such as zepatier for 12 weeks. Will apply thru medicaid. Will give hep B #2 today  mssa disseminated infection = resolved  CKD on HD = will continue to follow to see if she has improved kidney function where she may not need hemodialysis  Left knee effusion = likely reactive arthritis. Will continue to follow if worsens  Health maintenance = in the fall will give pneumococcal plus flu

## 2014-12-13 DIAGNOSIS — D509 Iron deficiency anemia, unspecified: Secondary | ICD-10-CM | POA: Diagnosis not present

## 2014-12-13 DIAGNOSIS — N186 End stage renal disease: Secondary | ICD-10-CM | POA: Diagnosis not present

## 2014-12-15 DIAGNOSIS — D509 Iron deficiency anemia, unspecified: Secondary | ICD-10-CM | POA: Diagnosis not present

## 2014-12-15 DIAGNOSIS — N186 End stage renal disease: Secondary | ICD-10-CM | POA: Diagnosis not present

## 2014-12-17 LAB — HCV RNA NS5A DRUG RESISTANCE

## 2014-12-18 DIAGNOSIS — N186 End stage renal disease: Secondary | ICD-10-CM | POA: Diagnosis not present

## 2014-12-18 DIAGNOSIS — D509 Iron deficiency anemia, unspecified: Secondary | ICD-10-CM | POA: Diagnosis not present

## 2014-12-20 DIAGNOSIS — D509 Iron deficiency anemia, unspecified: Secondary | ICD-10-CM | POA: Diagnosis not present

## 2014-12-20 DIAGNOSIS — N186 End stage renal disease: Secondary | ICD-10-CM | POA: Diagnosis not present

## 2014-12-21 ENCOUNTER — Other Ambulatory Visit: Payer: Self-pay | Admitting: Pharmacist Clinician (PhC)/ Clinical Pharmacy Specialist

## 2014-12-21 MED ORDER — ELBASVIR-GRAZOPREVIR 50-100 MG PO TABS: 1.0000 | ORAL_TABLET | Freq: Every day | ORAL | Status: DC

## 2014-12-22 DIAGNOSIS — D509 Iron deficiency anemia, unspecified: Secondary | ICD-10-CM | POA: Diagnosis not present

## 2014-12-22 DIAGNOSIS — N186 End stage renal disease: Secondary | ICD-10-CM | POA: Diagnosis not present

## 2014-12-25 DIAGNOSIS — D509 Iron deficiency anemia, unspecified: Secondary | ICD-10-CM | POA: Diagnosis not present

## 2014-12-25 DIAGNOSIS — N186 End stage renal disease: Secondary | ICD-10-CM | POA: Diagnosis not present

## 2014-12-27 DIAGNOSIS — D509 Iron deficiency anemia, unspecified: Secondary | ICD-10-CM | POA: Diagnosis not present

## 2014-12-27 DIAGNOSIS — N186 End stage renal disease: Secondary | ICD-10-CM | POA: Diagnosis not present

## 2014-12-27 DIAGNOSIS — R300 Dysuria: Secondary | ICD-10-CM | POA: Diagnosis not present

## 2014-12-29 DIAGNOSIS — N186 End stage renal disease: Secondary | ICD-10-CM | POA: Diagnosis not present

## 2014-12-29 DIAGNOSIS — D509 Iron deficiency anemia, unspecified: Secondary | ICD-10-CM | POA: Diagnosis not present

## 2015-01-01 DIAGNOSIS — D509 Iron deficiency anemia, unspecified: Secondary | ICD-10-CM | POA: Diagnosis not present

## 2015-01-01 DIAGNOSIS — N186 End stage renal disease: Secondary | ICD-10-CM | POA: Diagnosis not present

## 2015-01-05 DIAGNOSIS — D509 Iron deficiency anemia, unspecified: Secondary | ICD-10-CM | POA: Diagnosis not present

## 2015-01-05 DIAGNOSIS — N186 End stage renal disease: Secondary | ICD-10-CM | POA: Diagnosis not present

## 2015-01-08 DIAGNOSIS — N186 End stage renal disease: Secondary | ICD-10-CM | POA: Diagnosis not present

## 2015-01-08 DIAGNOSIS — D509 Iron deficiency anemia, unspecified: Secondary | ICD-10-CM | POA: Diagnosis not present

## 2015-01-11 ENCOUNTER — Telehealth: Payer: Self-pay | Admitting: Internal Medicine

## 2015-01-11 NOTE — Telephone Encounter (Signed)
See  Telephone note-df

## 2015-01-11 NOTE — Telephone Encounter (Signed)
See telephone message

## 2015-01-11 NOTE — Telephone Encounter (Signed)
See telephone note.

## 2015-01-12 DIAGNOSIS — N186 End stage renal disease: Secondary | ICD-10-CM | POA: Diagnosis not present

## 2015-01-12 DIAGNOSIS — D509 Iron deficiency anemia, unspecified: Secondary | ICD-10-CM | POA: Diagnosis not present

## 2015-01-15 ENCOUNTER — Other Ambulatory Visit: Payer: Medicaid Other

## 2015-01-15 DIAGNOSIS — N186 End stage renal disease: Secondary | ICD-10-CM | POA: Diagnosis not present

## 2015-01-15 DIAGNOSIS — D509 Iron deficiency anemia, unspecified: Secondary | ICD-10-CM | POA: Diagnosis not present

## 2015-01-16 ENCOUNTER — Other Ambulatory Visit: Payer: Medicaid Other

## 2015-01-16 ENCOUNTER — Other Ambulatory Visit: Payer: Self-pay | Admitting: Internal Medicine

## 2015-01-16 DIAGNOSIS — B1920 Unspecified viral hepatitis C without hepatic coma: Secondary | ICD-10-CM

## 2015-01-17 LAB — HEPATITIS C RNA QUANTITATIVE: HCV Quantitative: NOT DETECTED [IU]/mL

## 2015-01-18 ENCOUNTER — Telehealth: Payer: Self-pay | Admitting: Pharmacist Clinician (PhC)/ Clinical Pharmacy Specialist

## 2015-01-19 DIAGNOSIS — D509 Iron deficiency anemia, unspecified: Secondary | ICD-10-CM | POA: Diagnosis not present

## 2015-01-19 DIAGNOSIS — N186 End stage renal disease: Secondary | ICD-10-CM | POA: Diagnosis not present

## 2015-01-22 DIAGNOSIS — D509 Iron deficiency anemia, unspecified: Secondary | ICD-10-CM | POA: Diagnosis not present

## 2015-01-22 DIAGNOSIS — N186 End stage renal disease: Secondary | ICD-10-CM | POA: Diagnosis not present

## 2015-01-22 NOTE — Telephone Encounter (Signed)
Extension to hep C is approved.

## 2015-01-26 DIAGNOSIS — N186 End stage renal disease: Secondary | ICD-10-CM | POA: Diagnosis not present

## 2015-01-26 DIAGNOSIS — D509 Iron deficiency anemia, unspecified: Secondary | ICD-10-CM | POA: Diagnosis not present

## 2015-01-29 ENCOUNTER — Other Ambulatory Visit: Payer: Self-pay | Admitting: Internal Medicine

## 2015-01-29 DIAGNOSIS — D509 Iron deficiency anemia, unspecified: Secondary | ICD-10-CM | POA: Diagnosis not present

## 2015-01-29 DIAGNOSIS — N186 End stage renal disease: Secondary | ICD-10-CM | POA: Diagnosis not present

## 2015-01-29 DIAGNOSIS — E11319 Type 2 diabetes mellitus with unspecified diabetic retinopathy without macular edema: Secondary | ICD-10-CM

## 2015-01-29 MED ORDER — ACCU-CHEK SOFTCLIX LANCET DEV MISC: Status: AC

## 2015-01-29 MED ORDER — GLUCOSE BLOOD VI STRP: ORAL_STRIP | Status: DC

## 2015-01-29 MED ORDER — ACCU-CHEK AVIVA PLUS W/DEVICE KIT: PACK | Status: DC

## 2015-01-29 MED ORDER — "INSULIN SYRINGE-NEEDLE U-100 31G X 5/16"" 0.5 ML MISC": Status: DC

## 2015-02-02 DIAGNOSIS — N186 End stage renal disease: Secondary | ICD-10-CM | POA: Diagnosis not present

## 2015-02-02 DIAGNOSIS — D509 Iron deficiency anemia, unspecified: Secondary | ICD-10-CM | POA: Diagnosis not present

## 2015-02-05 DIAGNOSIS — N186 End stage renal disease: Secondary | ICD-10-CM | POA: Diagnosis not present

## 2015-02-05 DIAGNOSIS — D509 Iron deficiency anemia, unspecified: Secondary | ICD-10-CM | POA: Diagnosis not present

## 2015-02-08 ENCOUNTER — Encounter: Payer: Self-pay | Admitting: Internal Medicine

## 2015-02-08 ENCOUNTER — Ambulatory Visit (INDEPENDENT_AMBULATORY_CARE_PROVIDER_SITE_OTHER): Payer: Medicare Other | Admitting: Internal Medicine

## 2015-02-08 VITALS — BP 172/91 | HR 102 | Temp 98.0°F | Ht 67.0 in | Wt 167.0 lb

## 2015-02-08 DIAGNOSIS — F329 Major depressive disorder, single episode, unspecified: Secondary | ICD-10-CM | POA: Diagnosis present

## 2015-02-08 DIAGNOSIS — Z23 Encounter for immunization: Secondary | ICD-10-CM | POA: Diagnosis not present

## 2015-02-08 DIAGNOSIS — F32A Depression, unspecified: Secondary | ICD-10-CM

## 2015-02-08 MED ORDER — SERTRALINE HCL 50 MG PO TABS: 50.0000 mg | ORAL_TABLET | Freq: Every day | ORAL | Status: DC

## 2015-02-08 NOTE — Progress Notes (Signed)
[name], female   DOB: 13-Jul-1955, 59 y.o.   MRN: [id number] HPI: Brandy Houston is a 59 y.o. female who is here for her usual f/u.   Allergies: Allergies  Allergen Reactions  . Compazine [Prochlorperazine] Shortness Of Breath and Swelling    TONGUE SWELLS  . Omnipaque [Iohexol] Hives  . Shellfish-Derived Products Anaphylaxis  . Iodinated Diagnostic Agents Rash    Vitals: Temp: 98 F (36.7 C) (08/25 1014) Temp Source: Oral (08/25 1014) BP: 172/91 mmHg (08/25 1014) Pulse Rate: 102 (08/25 1014)  Past Medical History: Past Medical History  Diagnosis Date  . Diabetes mellitus   . Hypertension   . Peripheral neuropathy   . Hepatitis C 2016    Social History: Social History   Social History  . Marital Status: Single    Spouse Name: N/A  . Number of Children: N/A  . Years of Education: N/A   Social History Main Topics  . Smoking status: Current Every Day Smoker -- 0.50 packs/day for 20 years  . Smokeless tobacco: Never Used     Comment: Smoking 7-8 cigs per day  . Alcohol Use: No     Comment: per pt no alcohol since 09/2014  . Drug Use: No  . Sexual Activity: No   Other Topics Concern  . None   Social History Narrative    Previous Regimen:   Current Regimen:  Labs: HEP B S AB (no units)  Date Value  08/18/2014 Non Reactive   HEPATITIS B SURFACE AG (no units)  Date Value  09/18/2014 NEGATIVE   HCV AB (no units)  Date Value  08/10/2014 Reactive*    CrCl: CrCl cannot be calculated (Patient has no serum creatinine result on file.).  Lipids:    Component Value Date/Time   CHOL 163 02/21/2013 1051   TRIG 150* 02/21/2013 1051   HDL 73 02/21/2013 1051   CHOLHDL 2.2 02/21/2013 1051   VLDL 30 02/21/2013 1051   LDLCALC 60 02/21/2013 1051    Assessment: She is doing really well on Zepatier for her hep C. She stated that she had some rash early on but it went away but she still has some pruritus. Stated that is usually diffuse but not bad.  She has not missed any doses of her hep med. She is about to be on the last month of treatment. Her baseline NS5A was neg so only 12 wks is needed. Told her that she could try over the counter Claritin for itching to prevent drowsiness.   Recommendations:  Cont Zepatier for another month  Wilfred Lacy, PharmD Clinical Infectious [place] [facility] 02/08/2015, 10:57 AM

## 2015-02-08 NOTE — Progress Notes (Signed)
Subjective:    [name], female    DOB: 01/26/1956, 59 y.o.   MRN: [id number]  HPI 59yo F with HTN, DM2, chronic hep C without hepatic coma who was hospitalized in feb-march 2016 with sepsis due to MSSA bacteremia, with septic arhritis to bilateral knee and shoulder s/p I x D, but also c/b AKI now on only 2 x per week via left arm fistula. Her left knee still causes her discomfort occasionally to swelling. She denies fever chills ngithsweats. No diarrhea, n/v, other arthralgias.  She has been on zepatier for 2 months. Initially pruritic rash but improved. Still on hydroxyzine to help with itching. She thinks she will be able to finish up last 4 weeks of treatment  She is noticing being depressed, " lost the sunshine out of my life". She is wondering if she could start anti-depressant. Denies SI  Allergies  Allergen Reactions  . Compazine [Prochlorperazine] Shortness Of Breath and Swelling    TONGUE SWELLS  . Omnipaque [Iohexol] Hives  . Shellfish-Derived Products Anaphylaxis  . Iodinated Diagnostic Agents Rash   Current Outpatient Prescriptions on File Prior to Visit  Medication Sig Dispense Refill  . aspirin 81 MG chewable tablet Chew 81-162 mg by mouth daily as needed (For stroke prevention.).     [name] Blood Glucose Monitoring Suppl (ACCU-CHEK AVIVA PLUS) W/DEVICE KIT Check sugars TID for E11.65 1 kit 0  . calcium carbonate (TUMS E-X 750) 750 MG chewable tablet Chew 1 tablet (750 mg total) by mouth daily. With your largest meal    . diphenhydrAMINE (BENADRYL) 25 mg capsule Take 1 capsule (25 mg total) by mouth every 6 (six) hours as needed for itching. 30 capsule 0  . Elbasvir-Grazoprevir (ZEPATIER) 50-100 MG TABS Take 1 tablet by mouth daily with breakfast. 28 tablet 2  . gabapentin (NEURONTIN) 100 MG capsule Take 1 capsule (100 mg total) by mouth 2 (two) times daily. 60 capsule 2  . glucose blood (ACCU-CHEK AVIVA) test strip Check sugars TID for E11.65 100 each 12  .  hydrALAZINE (APRESOLINE) 25 MG tablet Take 1 tablet (25 mg total) by mouth 3 (three) times daily. 90 tablet 2  . Insulin Glargine (LANTUS SOLOSTAR) 100 UNIT/ML Solostar Pen Inject 5 Units into the skin daily at 10 pm. 5 pen 3  . Insulin Syringe-Needle U-100 (BD INSULIN SYRINGE ULTRAFINE) 31G X 5/16" 0.5 ML MISC Inject insulin once per night for E11.65 100 each 10  . Lancet Devices (ACCU-CHEK SOFTCLIX) lancets Check sugars TID for E11.65 1 each 10  . multivitamin (RENA-VIT) TABS tablet Take 1 tablet by mouth at bedtime. 30 tablet 1   No current facility-administered medications on file prior to visit.   Active Ambulatory Problems    Diagnosis Date Noted  . Chest pain at rest 10/18/2011  . Shortness of breath 10/18/2011  . Uncontrolled hypertension 02/18/2013  . Type II or unspecified type diabetes mellitus without mention of complication, uncontrolled 02/18/2013  . BP (high blood pressure) 06/24/2013  . Disorder of peripheral nervous system 06/24/2013  . 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 Diabetes mellitus, type 2 06/24/2013  . Tachycardia 08/07/2014  . Septic joint of left shoulder region 08/07/2014  . Sinus tachycardia 08/07/2014  . Staphylococcus aureus bacteremia   . Septic joint of right knee joint   . ESRD (end stage renal disease) on dialysis   . Septic joint of right hand   . SIRS (systemic inflammatory response syndrome)   . Benign essential HTN   .  Hepatitis C 08/16/2014  . Acute renal failure   . Acute renal failure syndrome   . Hep C w/ coma, chronic   . Joint infection   . Physical debility 09/14/2014   Resolved Ambulatory Problems    Diagnosis Date Noted  . No Resolved Ambulatory Problems   Past Medical History  Diagnosis Date  . Diabetes mellitus   . Hypertension   . Peripheral neuropathy       Review of Systems Pertinent positives, listed in hpi    Objective:   Physical Exam BP 172/91 mmHg  Pulse 102  Temp(Src) 98 F (36.7 C) (Oral)   Ht '5\' 7"'  (1.702 m)  Wt 167 lb (75.751 kg)  BMI 26.15 kg/m2  LMP 03/16/2002 Physical Exam  Constitutional:  oriented to person, place, and time. appears well-developed and well-nourished. No distress.  HENT: [place]/AT, PERRLA, no scleral icterus Mouth/Throat: Oropharynx is clear and moist. No oropharyngeal exudate.  Cardiovascular: Normal rate, regular rhythm and normal heart sounds. Exam reveals no gallop and no friction rub.  No murmur heard.  Pulmonary/Chest: Effort normal and breath sounds normal. No respiratory distress.  has no wheezes.  Neck = supple, no nuchal rigidity Abdominal: Soft. Bowel sounds are normal.  exhibits no distension. There is no tenderness.  Lymphadenopathy: no cervical adenopathy. No axillary adenopathy Neurological: alert and oriented to person, place, and time.  Skin: Skin is warm and dry. No rash noted. No erythema.  Psychiatric: decreased energy, outlook.        Assessment & Plan:  Hepatitis c = responding to treatment. Has finisehd 8 of 12 wk course of treatment. Continue with last 4 wks.  Will do svr 12 at end of dec  Depression = new onset, likely culmination of multiple med problems. Will start zoloft. Return in 2 wk to evaluate. Recommend counseling with kenny  htn =  Poorly controlled, will need to follow up with pcp or nephro  Health maintenance = give flu and pneumococcal in 2 wk

## 2015-02-09 DIAGNOSIS — D509 Iron deficiency anemia, unspecified: Secondary | ICD-10-CM | POA: Diagnosis not present

## 2015-02-09 DIAGNOSIS — N186 End stage renal disease: Secondary | ICD-10-CM | POA: Diagnosis not present

## 2015-02-12 DIAGNOSIS — D509 Iron deficiency anemia, unspecified: Secondary | ICD-10-CM | POA: Diagnosis not present

## 2015-02-12 DIAGNOSIS — N186 End stage renal disease: Secondary | ICD-10-CM | POA: Diagnosis not present

## 2015-02-14 DIAGNOSIS — Z992 Dependence on renal dialysis: Secondary | ICD-10-CM | POA: Diagnosis not present

## 2015-02-14 DIAGNOSIS — N186 End stage renal disease: Secondary | ICD-10-CM | POA: Diagnosis not present

## 2015-02-14 DIAGNOSIS — E1129 Type 2 diabetes mellitus with other diabetic kidney complication: Secondary | ICD-10-CM | POA: Diagnosis not present

## 2015-02-16 DIAGNOSIS — N186 End stage renal disease: Secondary | ICD-10-CM | POA: Diagnosis not present

## 2015-02-19 DIAGNOSIS — N186 End stage renal disease: Secondary | ICD-10-CM | POA: Diagnosis not present

## 2015-02-26 DIAGNOSIS — N186 End stage renal disease: Secondary | ICD-10-CM | POA: Diagnosis not present

## 2015-02-28 ENCOUNTER — Ambulatory Visit: Payer: Medicaid Other | Admitting: Internal Medicine

## 2015-03-06 DIAGNOSIS — M25562 Pain in left knee: Secondary | ICD-10-CM | POA: Diagnosis not present

## 2015-03-07 ENCOUNTER — Other Ambulatory Visit: Payer: Self-pay | Admitting: Orthopaedic Surgery

## 2015-03-07 DIAGNOSIS — M25562 Pain in left knee: Secondary | ICD-10-CM

## 2015-03-15 ENCOUNTER — Encounter: Payer: Self-pay | Admitting: Internal Medicine

## 2015-03-15 ENCOUNTER — Ambulatory Visit (INDEPENDENT_AMBULATORY_CARE_PROVIDER_SITE_OTHER): Payer: Medicare Other | Admitting: Internal Medicine

## 2015-03-15 VITALS — BP 167/76 | HR 76 | Temp 98.8°F | Wt 174.0 lb

## 2015-03-15 DIAGNOSIS — B1710 Acute hepatitis C without hepatic coma: Secondary | ICD-10-CM | POA: Diagnosis not present

## 2015-03-15 DIAGNOSIS — N2581 Secondary hyperparathyroidism of renal origin: Secondary | ICD-10-CM | POA: Diagnosis not present

## 2015-03-15 DIAGNOSIS — E1129 Type 2 diabetes mellitus with other diabetic kidney complication: Secondary | ICD-10-CM | POA: Diagnosis not present

## 2015-03-15 DIAGNOSIS — N184 Chronic kidney disease, stage 4 (severe): Secondary | ICD-10-CM | POA: Diagnosis not present

## 2015-03-15 DIAGNOSIS — F329 Major depressive disorder, single episode, unspecified: Secondary | ICD-10-CM

## 2015-03-15 DIAGNOSIS — D631 Anemia in chronic kidney disease: Secondary | ICD-10-CM | POA: Diagnosis not present

## 2015-03-15 DIAGNOSIS — F32A Depression, unspecified: Secondary | ICD-10-CM

## 2015-03-15 DIAGNOSIS — Z23 Encounter for immunization: Secondary | ICD-10-CM

## 2015-03-15 MED ORDER — SERTRALINE HCL 100 MG PO TABS: 100.0000 mg | ORAL_TABLET | Freq: Every day | ORAL | Status: DC

## 2015-03-15 NOTE — Progress Notes (Signed)
[name], female   DOB: 05-24-1956, 59 y.o.   MRN: [id number]      Rfv: hep C, and depression  [name], female   DOB: 05/07/1956, 59 y.o.   MRN: [id number]  HP, I 59yo F with hx of MSSA disseminated infection, DM, septic arthritis, ESRD on HD, but then improved, now no longer on HD but CKD 5, chronic hepatitis C without hepatic coma finished with 12 weeks of zepatier. She was seen roughly 4 weeks ago as part of hep C,but then subscribed to having depression. We started her on zoloft at that time. She states she is feeling better but still has occasional mood swings. She would like to try higher dose of zoloft. She saw her nephrologist this week who increased lasix to help with lower extremity edema but still no need for further HD. In regards to OA. Patient is following up with Dr. [name] who had did her washouts of septic joints. Plan is to repeat mri of left knee to see if can find pathology for left knee pain.  Outpatient Encounter Prescriptions as of 03/15/2015  Medication Sig  . aspirin 81 MG chewable tablet Chew 81-162 mg by mouth daily as needed (For stroke prevention.).   [name] Blood Glucose Monitoring Suppl (ACCU-CHEK AVIVA PLUS) W/DEVICE KIT Check sugars TID for E11.65  . calcium carbonate (TUMS E-X 750) 750 MG chewable tablet Chew 1 tablet (750 mg total) by mouth daily. With your largest meal  . diphenhydrAMINE (BENADRYL) 25 mg capsule Take 1 capsule (25 mg total) by mouth every 6 (six) hours as needed for itching.  . Elbasvir-Grazoprevir (ZEPATIER) 50-100 MG TABS Take 1 tablet by mouth daily with breakfast.  . furosemide (LASIX) 80 MG tablet Take 80 mg by mouth daily.  [name] gabapentin (NEURONTIN) 100 MG capsule Take 1 capsule (100 mg total) by mouth 2 (two) times daily.  [name] glucose blood (ACCU-CHEK AVIVA) test strip Check sugars TID for E11.65  . hydrALAZINE (APRESOLINE) 25 MG tablet Take 1 tablet (25 mg total) by mouth 3 (three) times daily.  . Insulin Glargine (LANTUS  SOLOSTAR) 100 UNIT/ML Solostar Pen Inject 5 Units into the skin daily at 10 pm.  . Insulin Syringe-Needle U-100 (BD INSULIN SYRINGE ULTRAFINE) 31G X 5/16" 0.5 ML MISC Inject insulin once per night for E11.65  . Lancet Devices (ACCU-CHEK SOFTCLIX) lancets Check sugars TID for E11.65  . multivitamin (RENA-VIT) TABS tablet Take 1 tablet by mouth at bedtime.  . sertraline (ZOLOFT) 50 MG tablet Take 1 tablet (50 mg total) by mouth daily. Start taking 1/2 tab per day x 6 days, then increase to 1 tab per day   No facility-administered encounter medications on file as of 03/15/2015.     Patient Active Problem List   Diagnosis Date Noted  . Physical debility 09/14/2014  . Acute renal failure syndrome   . Hep C w/ coma, chronic   . Joint infection   . Acute renal failure   . Hepatitis C 08/16/2014  . SIRS (systemic inflammatory response syndrome)   . Benign essential HTN   . Septic joint of right hand   . Staphylococcus aureus bacteremia   . Septic joint of right knee joint   . ESRD (end stage renal disease) on dialysis   . Tachycardia 08/07/2014  . Septic joint of left shoulder region 08/07/2014  . Sinus tachycardia 08/07/2014  . BP (high blood pressure) 06/24/2013  . Disorder of peripheral nervous system 06/24/2013  . Compulsive  tobacco user syndrome 06/24/2013  . Diabetes mellitus, type 2 06/24/2013  . Uncontrolled hypertension 02/18/2013  . Type II or unspecified type diabetes mellitus without mention of complication, uncontrolled 02/18/2013  . Chest pain at rest 10/18/2011    Class: Acute  . Shortness of breath 10/18/2011    Class: Acute     Health Maintenance Due  Topic Date Due  . OPHTHALMOLOGY EXAM  09/26/1964  . TETANUS/TDAP  09/26/1973  . PAP SMEAR  09/27/1975  . MAMMOGRAM  09/26/2004  . ZOSTAVAX  09/27/2014  . FOOT EXAM  12/16/2014     Review of Systems 10 point ros reviewed, positive pertinents listed in hpi Physical Exam   BP 167/76 mmHg  Pulse 76  Temp(Src)  98.8 F (37.1 C) (Oral)  Wt 174 lb (78.926 kg)  LMP 03/16/2002 Physical Exam  Constitutional:  oriented to person, place, and time. appears well-developed and well-nourished. No distress.  HENT: [place]/AT, PERRLA, no scleral icterus Mouth/Throat: Oropharynx is clear and moist. No oropharyngeal exudate.  Cardiovascular: Normal rate, regular rhythm and normal heart sounds. Exam reveals no gallop and no friction rub.  No murmur heard.  Pulmonary/Chest: Effort normal and breath sounds normal. No respiratory distress.  has no wheezes.  Neck = supple, no nuchal rigidity Abdominal: Soft. Bowel sounds are normal.  exhibits no distension. There is no tenderness.  Lymphadenopathy: no cervical adenopathy. No axillary adenopathy Neurological: alert and oriented to person, place, and time.  Skin: Skin is warm and dry. No rash noted. No erythema.  Psychiatric: a normal mood and affect.  behavior is normal.    Lab Results  Component Value Date   HEPBSAB Non Reactive 08/18/2014    CBC Lab Results  Component Value Date   WBC 7.6 09/28/2014   RBC 2.99* 09/28/2014   HGB 8.6* 09/28/2014   HCT 27.4* 09/28/2014   PLT 310 09/28/2014   MCV 91.6 09/28/2014   MCH 28.8 09/28/2014   MCHC 31.4 09/28/2014   RDW 15.2 09/28/2014   LYMPHSABS 1.7 08/18/2014   MONOABS 1.2* 08/18/2014   EOSABS 0.1 08/18/2014   BASOSABS 0.0 08/18/2014   BMET Lab Results  Component Value Date   NA 141 11/07/2014   K 4.2 11/07/2014   CL 98 11/07/2014   CO2 30 11/07/2014   GLUCOSE 142* 11/07/2014   BUN 27* 11/07/2014   CREATININE 2.08* 11/07/2014   CALCIUM 9.0 11/07/2014   GFRNONAA 25* 11/07/2014   GFRAA 29* 11/07/2014     Assessment and Plan   Chronic hep c without hepatic coma = will finish up 4 more days of treatment. Will check viral load  in 3 months, at end of December/beg January  depresssion = will increase dose of zoloft to 113m, recommend follow up with pcp  Health maintenance =  flu shot given at last  visit

## 2015-03-16 ENCOUNTER — Ambulatory Visit
Admission: RE | Admit: 2015-03-16 | Discharge: 2015-03-16 | Disposition: A | Discharge status: Home / Self Care | Payer: Medicare Other | Source: Ambulatory Visit | Attending: Orthopaedic Surgery | Admitting: Orthopaedic Surgery

## 2015-03-16 DIAGNOSIS — N186 End stage renal disease: Secondary | ICD-10-CM | POA: Diagnosis not present

## 2015-03-16 DIAGNOSIS — S83282A Other tear of lateral meniscus, current injury, left knee, initial encounter: Secondary | ICD-10-CM | POA: Diagnosis not present

## 2015-03-16 DIAGNOSIS — M25562 Pain in left knee: Secondary | ICD-10-CM

## 2015-03-16 DIAGNOSIS — E1129 Type 2 diabetes mellitus with other diabetic kidney complication: Secondary | ICD-10-CM | POA: Diagnosis not present

## 2015-03-16 DIAGNOSIS — Z992 Dependence on renal dialysis: Secondary | ICD-10-CM | POA: Diagnosis not present

## 2015-03-16 DIAGNOSIS — S83242A Other tear of medial meniscus, current injury, left knee, initial encounter: Secondary | ICD-10-CM | POA: Diagnosis not present

## 2015-03-19 DIAGNOSIS — M00262 Other streptococcal arthritis, left knee: Secondary | ICD-10-CM | POA: Diagnosis not present

## 2015-03-19 DIAGNOSIS — M25562 Pain in left knee: Secondary | ICD-10-CM | POA: Diagnosis not present

## 2015-03-29 DIAGNOSIS — M23322 Other meniscus derangements, posterior horn of medial meniscus, left knee: Secondary | ICD-10-CM | POA: Diagnosis not present

## 2015-03-30 ENCOUNTER — Encounter (HOSPITAL_BASED_OUTPATIENT_CLINIC_OR_DEPARTMENT_OTHER): Payer: Self-pay | Admitting: *Deleted

## 2015-03-30 ENCOUNTER — Other Ambulatory Visit (HOSPITAL_BASED_OUTPATIENT_CLINIC_OR_DEPARTMENT_OTHER): Payer: Self-pay | Admitting: Orthopaedic Surgery

## 2015-04-03 ENCOUNTER — Encounter (HOSPITAL_BASED_OUTPATIENT_CLINIC_OR_DEPARTMENT_OTHER): Payer: Self-pay | Admitting: *Deleted

## 2015-04-03 ENCOUNTER — Encounter (HOSPITAL_BASED_OUTPATIENT_CLINIC_OR_DEPARTMENT_OTHER)
Admission: RE | Admit: 2015-04-03 | Discharge: 2015-04-03 | Disposition: A | Discharge status: Home / Self Care | Payer: Medicare Other | Source: Ambulatory Visit | Attending: Orthopaedic Surgery | Admitting: Orthopaedic Surgery

## 2015-04-03 DIAGNOSIS — F17200 Nicotine dependence, unspecified, uncomplicated: Secondary | ICD-10-CM | POA: Diagnosis not present

## 2015-04-03 DIAGNOSIS — S83282A Other tear of lateral meniscus, current injury, left knee, initial encounter: Secondary | ICD-10-CM | POA: Diagnosis not present

## 2015-04-03 DIAGNOSIS — E1122 Type 2 diabetes mellitus with diabetic chronic kidney disease: Secondary | ICD-10-CM | POA: Diagnosis not present

## 2015-04-03 DIAGNOSIS — Z7982 Long term (current) use of aspirin: Secondary | ICD-10-CM | POA: Diagnosis not present

## 2015-04-03 DIAGNOSIS — Z794 Long term (current) use of insulin: Secondary | ICD-10-CM | POA: Diagnosis not present

## 2015-04-03 DIAGNOSIS — M25462 Effusion, left knee: Secondary | ICD-10-CM | POA: Diagnosis not present

## 2015-04-03 DIAGNOSIS — I129 Hypertensive chronic kidney disease with stage 1 through stage 4 chronic kidney disease, or unspecified chronic kidney disease: Secondary | ICD-10-CM | POA: Diagnosis not present

## 2015-04-03 DIAGNOSIS — X58XXXA Exposure to other specified factors, initial encounter: Secondary | ICD-10-CM | POA: Diagnosis not present

## 2015-04-03 DIAGNOSIS — S83242A Other tear of medial meniscus, current injury, left knee, initial encounter: Secondary | ICD-10-CM | POA: Diagnosis not present

## 2015-04-03 DIAGNOSIS — E1140 Type 2 diabetes mellitus with diabetic neuropathy, unspecified: Secondary | ICD-10-CM | POA: Diagnosis not present

## 2015-04-03 DIAGNOSIS — N189 Chronic kidney disease, unspecified: Secondary | ICD-10-CM | POA: Diagnosis not present

## 2015-04-03 LAB — COMPREHENSIVE METABOLIC PANEL
ALT: 20 U/L (ref 14–54)
AST: 16 U/L (ref 15–41)
Albumin: 3.2 g/dL — ABNORMAL LOW (ref 3.5–5.0)
Alkaline Phosphatase: 150 U/L — ABNORMAL HIGH (ref 38–126)
Anion gap: 8 (ref 5–15)
BUN: 49 mg/dL — ABNORMAL HIGH (ref 6–20)
CO2: 28 mmol/L (ref 22–32)
Calcium: 9 mg/dL (ref 8.9–10.3)
Chloride: 103 mmol/L (ref 101–111)
Creatinine, Ser: 2.33 mg/dL — ABNORMAL HIGH (ref 0.44–1.00)
GFR calc Af Amer: 25 mL/min — ABNORMAL LOW (ref >60)
GFR calc non Af Amer: 22 mL/min — ABNORMAL LOW (ref >60)
Glucose, Bld: 161 mg/dL — ABNORMAL HIGH (ref 65–99)
Potassium: 4.3 mmol/L (ref 3.5–5.1)
Sodium: 139 mmol/L (ref 135–145)
Total Bilirubin: 0.8 mg/dL (ref 0.3–1.2)
Total Protein: 8.4 g/dL — ABNORMAL HIGH (ref 6.5–8.1)

## 2015-04-03 NOTE — Progress Notes (Signed)
Dr. Lissa Hoard reviewed labs from 04/03/2015 and reviewed history- would like I stat on day of surgery.

## 2015-04-03 NOTE — Anesthesia Preprocedure Evaluation (Addendum)
Anesthesia Evaluation  Patient identified by MRN, date of birth, ID band Patient awake    Reviewed: Allergy & Precautions, NPO status , Patient's Chart, lab work & pertinent test results  History of Anesthesia Complications Negative for: history of anesthetic complications  Airway Mallampati: II  TM Distance: >3 FB Neck ROM: Full    Dental no notable dental hx. (+) Dental Advisory Given, Poor Dentition, Loose, Chipped, Missing,    Pulmonary Current Smoker,    Pulmonary exam normal breath sounds clear to auscultation       Cardiovascular hypertension, Pt. on medications Normal cardiovascular exam Rhythm:Regular Rate:Normal     Neuro/Psych PSYCHIATRIC DISORDERS Anxiety Depression negative neurological ROS     GI/Hepatic GERD  Medicated and Controlled,(+) Hepatitis -, C  Endo/Other  diabetes  Renal/GU ESRFRenal disease  negative genitourinary   Musculoskeletal  (+) Arthritis , Osteoarthritis,    Abdominal   Peds negative pediatric ROS (+)  Hematology negative hematology ROS (+)   Anesthesia Other Findings   Reproductive/Obstetrics negative OB ROS                            Anesthesia Physical Anesthesia Plan  ASA: III  Anesthesia Plan: General   Post-op Pain Management:    Induction: Intravenous  Airway Management Planned: LMA  Additional Equipment:   Intra-op Plan:   Post-operative Plan: Extubation in OR  Informed Consent: I have reviewed the patients History and Physical, chart, labs and discussed the procedure including the risks, benefits and alternatives for the proposed anesthesia with the patient or authorized representative who has indicated his/her understanding and acceptance.   Dental advisory given  Plan Discussed with: CRNA  Anesthesia Plan Comments:        Anesthesia Quick Evaluation

## 2015-04-04 ENCOUNTER — Encounter (HOSPITAL_BASED_OUTPATIENT_CLINIC_OR_DEPARTMENT_OTHER)
Admission: RE | Disposition: A | Discharge status: Home / Self Care | Payer: Self-pay | Source: Ambulatory Visit | Attending: Orthopaedic Surgery

## 2015-04-04 ENCOUNTER — Encounter (HOSPITAL_BASED_OUTPATIENT_CLINIC_OR_DEPARTMENT_OTHER): Payer: Self-pay | Admitting: *Deleted

## 2015-04-04 ENCOUNTER — Ambulatory Visit (HOSPITAL_BASED_OUTPATIENT_CLINIC_OR_DEPARTMENT_OTHER)
Admission: RE | Admit: 2015-04-04 | Discharge: 2015-04-04 | Disposition: A | Discharge status: Home / Self Care | Payer: Medicare Other | Source: Ambulatory Visit | Attending: Orthopaedic Surgery | Admitting: Orthopaedic Surgery

## 2015-04-04 ENCOUNTER — Ambulatory Visit (HOSPITAL_BASED_OUTPATIENT_CLINIC_OR_DEPARTMENT_OTHER): Payer: Medicare Other | Admitting: Anesthesiology

## 2015-04-04 DIAGNOSIS — N189 Chronic kidney disease, unspecified: Secondary | ICD-10-CM | POA: Insufficient documentation

## 2015-04-04 DIAGNOSIS — Z7982 Long term (current) use of aspirin: Secondary | ICD-10-CM | POA: Insufficient documentation

## 2015-04-04 DIAGNOSIS — S83282A Other tear of lateral meniscus, current injury, left knee, initial encounter: Secondary | ICD-10-CM | POA: Diagnosis not present

## 2015-04-04 DIAGNOSIS — S83242A Other tear of medial meniscus, current injury, left knee, initial encounter: Secondary | ICD-10-CM | POA: Diagnosis not present

## 2015-04-04 DIAGNOSIS — M65862 Other synovitis and tenosynovitis, left lower leg: Secondary | ICD-10-CM | POA: Diagnosis not present

## 2015-04-04 DIAGNOSIS — E1122 Type 2 diabetes mellitus with diabetic chronic kidney disease: Secondary | ICD-10-CM | POA: Insufficient documentation

## 2015-04-04 DIAGNOSIS — E1140 Type 2 diabetes mellitus with diabetic neuropathy, unspecified: Secondary | ICD-10-CM | POA: Diagnosis not present

## 2015-04-04 DIAGNOSIS — X58XXXA Exposure to other specified factors, initial encounter: Secondary | ICD-10-CM | POA: Insufficient documentation

## 2015-04-04 DIAGNOSIS — M23307 Other meniscus derangements, unspecified meniscus, left knee: Secondary | ICD-10-CM | POA: Diagnosis not present

## 2015-04-04 DIAGNOSIS — I129 Hypertensive chronic kidney disease with stage 1 through stage 4 chronic kidney disease, or unspecified chronic kidney disease: Secondary | ICD-10-CM | POA: Diagnosis not present

## 2015-04-04 DIAGNOSIS — M25462 Effusion, left knee: Secondary | ICD-10-CM | POA: Insufficient documentation

## 2015-04-04 DIAGNOSIS — E119 Type 2 diabetes mellitus without complications: Secondary | ICD-10-CM | POA: Diagnosis not present

## 2015-04-04 DIAGNOSIS — F17200 Nicotine dependence, unspecified, uncomplicated: Secondary | ICD-10-CM | POA: Insufficient documentation

## 2015-04-04 DIAGNOSIS — Z794 Long term (current) use of insulin: Secondary | ICD-10-CM | POA: Insufficient documentation

## 2015-04-04 HISTORY — DX: Depression, unspecified: F32.A

## 2015-04-04 HISTORY — DX: Gastro-esophageal reflux disease without esophagitis: K21.9

## 2015-04-04 HISTORY — DX: Anxiety disorder, unspecified: F41.9

## 2015-04-04 HISTORY — DX: Major depressive disorder, single episode, unspecified: F32.9

## 2015-04-04 HISTORY — PX: KNEE ARTHROSCOPY WITH LATERAL MENISECTOMY: SHX6193

## 2015-04-04 HISTORY — PX: KNEE ARTHROSCOPY WITH MEDIAL MENISECTOMY: SHX5651

## 2015-04-04 HISTORY — DX: Cardiac arrhythmia, unspecified: I49.9

## 2015-04-04 HISTORY — DX: Pyogenic arthritis, unspecified: M00.9

## 2015-04-04 LAB — POCT I-STAT, CHEM 8
BUN: 59 mg/dL — ABNORMAL HIGH (ref 6–20)
Calcium, Ion: 1.03 mmol/L — ABNORMAL LOW (ref 1.13–1.30)
Chloride: 106 mmol/L (ref 101–111)
Creatinine, Ser: 2 mg/dL — ABNORMAL HIGH (ref 0.44–1.00)
Glucose, Bld: 127 mg/dL — ABNORMAL HIGH (ref 65–99)
HCT: 38 % (ref 36.0–46.0)
Hemoglobin: 12.9 g/dL (ref 12.0–15.0)
Potassium: 4.4 mmol/L (ref 3.5–5.1)
Sodium: 141 mmol/L (ref 135–145)
TCO2: 25 mmol/L (ref 0–100)

## 2015-04-04 LAB — GLUCOSE, CAPILLARY: Glucose-Capillary: 119 mg/dL — ABNORMAL HIGH (ref 65–99)

## 2015-04-04 SURGERY — ARTHROSCOPY, KNEE, WITH MEDIAL MENISCECTOMY
Anesthesia: General | Site: Knee | Laterality: Left

## 2015-04-04 MED ORDER — ONDANSETRON HCL 4 MG/2ML IJ SOLN
INTRAMUSCULAR | Status: DC | PRN
  Administered 2015-04-04: 4 mg via INTRAVENOUS

## 2015-04-04 MED ORDER — FENTANYL CITRATE (PF) 100 MCG/2ML IJ SOLN: 25.0000 ug | INTRAMUSCULAR | Status: DC | PRN

## 2015-04-04 MED ORDER — MIDAZOLAM HCL 2 MG/2ML IJ SOLN
1.0000 mg | INTRAMUSCULAR | Status: DC | PRN
  Administered 2015-04-04: 1 mg via INTRAVENOUS

## 2015-04-04 MED ORDER — CEFAZOLIN SODIUM-DEXTROSE 2-3 GM-% IV SOLR
INTRAVENOUS | Status: AC
  Filled 2015-04-04: qty 50

## 2015-04-04 MED ORDER — BUPIVACAINE HCL (PF) 0.5 % IJ SOLN
INTRAMUSCULAR | Status: AC
  Filled 2015-04-04: qty 30

## 2015-04-04 MED ORDER — PROPOFOL 10 MG/ML IV BOLUS
INTRAVENOUS | Status: DC | PRN
  Administered 2015-04-04: 150 mg via INTRAVENOUS

## 2015-04-04 MED ORDER — FENTANYL CITRATE (PF) 100 MCG/2ML IJ SOLN
INTRAMUSCULAR | Status: AC
  Filled 2015-04-04: qty 2

## 2015-04-04 MED ORDER — FENTANYL CITRATE (PF) 100 MCG/2ML IJ SOLN
50.0000 ug | INTRAMUSCULAR | Status: DC | PRN
  Administered 2015-04-04: 50 ug via INTRAVENOUS

## 2015-04-04 MED ORDER — SCOPOLAMINE 1 MG/3DAYS TD PT72: 1.0000 | MEDICATED_PATCH | Freq: Once | TRANSDERMAL | Status: DC | PRN

## 2015-04-04 MED ORDER — LIDOCAINE HCL (CARDIAC) 20 MG/ML IV SOLN
INTRAVENOUS | Status: DC | PRN
  Administered 2015-04-04: 60 mg via INTRAVENOUS

## 2015-04-04 MED ORDER — SODIUM CHLORIDE 0.9 % IR SOLN
Status: DC | PRN
  Administered 2015-04-04: 15000 mL

## 2015-04-04 MED ORDER — DEXAMETHASONE SODIUM PHOSPHATE 10 MG/ML IJ SOLN
INTRAMUSCULAR | Status: DC | PRN
  Administered 2015-04-04: 4 mg via INTRAVENOUS

## 2015-04-04 MED ORDER — MIDAZOLAM HCL 2 MG/2ML IJ SOLN
INTRAMUSCULAR | Status: AC
  Filled 2015-04-04: qty 4

## 2015-04-04 MED ORDER — BUPIVACAINE HCL (PF) 0.25 % IJ SOLN
INTRAMUSCULAR | Status: DC | PRN
  Administered 2015-04-04: 11 mL

## 2015-04-04 MED ORDER — HYDROCODONE-ACETAMINOPHEN 7.5-325 MG PO TABS: 1.0000 | ORAL_TABLET | Freq: Four times a day (QID) | ORAL | Status: DC | PRN

## 2015-04-04 MED ORDER — LACTATED RINGERS IV SOLN: INTRAVENOUS | Status: DC

## 2015-04-04 MED ORDER — SODIUM CHLORIDE 0.9 % IV SOLN
INTRAVENOUS | Status: DC
  Administered 2015-04-04: 10:00:00 via INTRAVENOUS

## 2015-04-04 MED ORDER — ONDANSETRON HCL 4 MG/2ML IJ SOLN: 4.0000 mg | Freq: Once | INTRAMUSCULAR | Status: DC | PRN

## 2015-04-04 MED ORDER — GLYCOPYRROLATE 0.2 MG/ML IJ SOLN: 0.2000 mg | Freq: Once | INTRAMUSCULAR | Status: DC | PRN

## 2015-04-04 MED ORDER — CEFAZOLIN SODIUM-DEXTROSE 2-3 GM-% IV SOLR
2.0000 g | INTRAVENOUS | Status: AC
  Administered 2015-04-04: 2 g via INTRAVENOUS

## 2015-04-04 SURGICAL SUPPLY — 38 items
BANDAGE ELASTIC 6 VELCRO ST LF (GAUZE/BANDAGES/DRESSINGS) ×2 IMPLANT
BANDAGE ESMARK 6X9 LF (GAUZE/BANDAGES/DRESSINGS) IMPLANT
BLADE 4.2CUDA (BLADE) ×2 IMPLANT
BLADE CUDA GRT WHITE 3.5 (BLADE) IMPLANT
BLADE CUDA SHAVER 3.5 (BLADE) IMPLANT
BLADE CUTTER GATOR 3.5 (BLADE) IMPLANT
BNDG ESMARK 6X9 LF (GAUZE/BANDAGES/DRESSINGS)
CUFF TOURNIQUET SINGLE 34IN LL (TOURNIQUET CUFF) ×2 IMPLANT
DRAPE ARTHROSCOPY W/POUCH 90 (DRAPES) ×2 IMPLANT
DRAPE SURG 17X23 STRL (DRAPES) ×4 IMPLANT
DRSG PAD ABDOMINAL 8X10 ST (GAUZE/BANDAGES/DRESSINGS) ×2 IMPLANT
DURAPREP 26ML APPLICATOR (WOUND CARE) ×2 IMPLANT
ELECT MENISCUS 165MM 90D (ELECTRODE) IMPLANT
ELECT REM PT RETURN 9FT ADLT (ELECTROSURGICAL)
ELECTRODE REM PT RTRN 9FT ADLT (ELECTROSURGICAL) IMPLANT
GAUZE SPONGE 4X4 12PLY STRL (GAUZE/BANDAGES/DRESSINGS) ×2 IMPLANT
GAUZE XEROFORM 1X8 LF (GAUZE/BANDAGES/DRESSINGS) ×2 IMPLANT
GLOVE NEODERM STRL 7.5 LF PF (GLOVE) ×1 IMPLANT
GLOVE SURG NEODERM 7.5  LF PF (GLOVE) ×1
GLOVE SURG SYN 7.5  E (GLOVE) ×2
GLOVE SURG SYN 7.5 E (GLOVE) ×2 IMPLANT
GOWN STRL REIN XL XLG (GOWN DISPOSABLE) ×2 IMPLANT
GOWN STRL REUS W/ TWL LRG LVL3 (GOWN DISPOSABLE) ×1 IMPLANT
GOWN STRL REUS W/TWL LRG LVL3 (GOWN DISPOSABLE) ×1
KNEE WRAP E Z 3 GEL PACK (MISCELLANEOUS) ×2 IMPLANT
MANIFOLD NEPTUNE II (INSTRUMENTS) ×2 IMPLANT
PACK ARTHROSCOPY DSU (CUSTOM PROCEDURE TRAY) ×2 IMPLANT
PACK BASIN DAY SURGERY FS (CUSTOM PROCEDURE TRAY) ×2 IMPLANT
PENCIL BUTTON HOLSTER BLD 10FT (ELECTRODE) IMPLANT
RESECTOR FULL RADIUS 4.2MM (BLADE) IMPLANT
SET ARTHROSCOPY TUBING (MISCELLANEOUS) ×1
SET ARTHROSCOPY TUBING LN (MISCELLANEOUS) ×1 IMPLANT
SLEEVE SCD COMPRESS KNEE MED (MISCELLANEOUS) ×2 IMPLANT
SUT ETHILON 3 0 PS 1 (SUTURE) IMPLANT
TOWEL OR 17X24 6PK STRL BLUE (TOWEL DISPOSABLE) ×2 IMPLANT
TOWEL OR NON WOVEN STRL DISP B (DISPOSABLE) ×2 IMPLANT
WAND STAR VAC 90 (SURGICAL WAND) ×2 IMPLANT
WATER STERILE IRR 1000ML POUR (IV SOLUTION) ×2 IMPLANT

## 2015-04-04 NOTE — H&P (Signed)
PREOPERATIVE H&P  Chief Complaint: Left knee medial meniscal tear, lateral meniscal tear, synovitis  HPI: Brandy Houston is a 59 y.o. female who presents for surgical treatment of Left knee medial meniscal tear, lateral meniscal tear, synovitis.  She denies any changes in medical history.  Past Medical History  Diagnosis Date  . Hypertension   . Peripheral neuropathy ([place])   . Hepatitis C 2016  . Diabetes mellitus     type 2, IDDM  . Depression   . Anxiety   . Chronic kidney disease   . GERD (gastroesophageal reflux disease)   . Septic arthritis ([place])   . Dysrhythmia     tachycardia, normal ECHO 08-09-14  . Hemodialysis patient [facility])    Past Surgical History  Procedure Laterality Date  . Cholecystectomy    . Appendectomy    . Small intestine surgery      Due to Small Bowel Obstruction  . Tubal ligation    . Knee arthroscopy Right 08/10/2014    Procedure: ARTHROSCOPY I & D KNEE;  Surgeon: Marianna Payment, MD;  Location: [facility];  Service: Orthopedics;  Laterality: Right;  . Shoulder arthroscopy Bilateral 08/10/2014    Procedure: I & D BILATERAL SHOULDERS ;  Surgeon: Marianna Payment, MD;  Location: [facility];  Service: Orthopedics;  Laterality: Bilateral;  . Tenosynovectomy Right 08/11/2014    Procedure: RIGHT WRIST IRRIGATION AND DEBRIDEMENT, TENOSYNOVECTOMY;  Surgeon: Marianna Payment, MD;  Location: [place];  Service: Orthopedics;  Laterality: Right;  . Knee arthroscopy Left 08/11/2014    Procedure: ARTHROSCOPIC WASHOUT LEFT KNEE;  Surgeon: Marianna Payment, MD;  Location: [place];  Service: Orthopedics;  Laterality: Left;  . Tee without cardioversion N/A 08/14/2014    Procedure: TRANSESOPHAGEAL ECHOCARDIOGRAM (TEE);  Surgeon: Thayer Headings, MD;  Location: [place];  Service: Cardiovascular;  Laterality: N/A;  . Knee arthroscopy Left 08/19/2014    Procedure: ARTHROSCOPIC WASHOUT LEFT KNEE;  Surgeon: Leandrew Koyanagi, MD;  Location: [place];  Service: Orthopedics;  Laterality:  Left;  . Av fistula placement Left 09/13/2014    Procedure: Brachial Artery to Brachial Vein Gortex Four - Seven Stretch GRAFT INSERTION Left Forearm;  Surgeon: Mal Misty, MD;  Location: [facility] OR;  Service: Vascular;  Laterality: Left;   Social History   Social History  . Marital Status: Single    Spouse Name: N/A  . Number of Children: N/A  . Years of Education: N/A   Social History Main Topics  . Smoking status: Current Every Day Smoker -- 0.50 packs/day for 20 years  . Smokeless tobacco: Never Used     Comment: Smoking 7-8 cigs per day  . Alcohol Use: No     Comment: per pt no alcohol since 09/2014  . Drug Use: No  . Sexual Activity: No   Other Topics Concern  . None   Social History Narrative   Family History  Problem Relation Age of Onset  . Cancer Mother   . Heart disease Father   . Cancer Sister   . Cancer Brother   . Diabetes Brother    Allergies  Allergen Reactions  . Compazine [Prochlorperazine] Shortness Of Breath and Swelling    TONGUE SWELLS  . Omnipaque [Iohexol] Hives  . Shellfish-Derived Products Anaphylaxis  . Iodinated Diagnostic Agents Rash   Prior to Admission medications   Medication Sig Start Date End Date Taking? Authorizing Provider  amLODipine (NORVASC) 10 MG tablet Take 10 mg by mouth daily.  Yes Historical Provider, MD  aspirin 81 MG chewable tablet Chew 81-162 mg by mouth daily as needed (For stroke prevention.).    Yes Historical Provider, MD  calcium carbonate (TUMS E-X 750) 750 MG chewable tablet Chew 1 tablet (750 mg total) by mouth daily. With your largest meal 09/29/14  Yes Ivan Anchors Love, PA-C  diphenhydrAMINE (BENADRYL) 25 mg capsule Take 1 capsule (25 mg total) by mouth every 6 (six) hours as needed for itching. 09/28/14  Yes Ivan Anchors Love, PA-C  furosemide (LASIX) 80 MG tablet Take 160 mg by mouth daily.    Yes Historical Provider, MD  gabapentin (NEURONTIN) 100 MG capsule Take 1 capsule (100 mg total) by mouth 2 (two) times daily.  12/06/14  Yes Lance Bosch, NP  Insulin Glargine (LANTUS SOLOSTAR) 100 UNIT/ML Solostar Pen Inject 5 Units into the skin daily at 10 pm. 11/14/14  Yes Lance Bosch, NP  metoprolol (LOPRESSOR) 50 MG tablet Take 50 mg by mouth 2 (two) times daily.   Yes Historical Provider, MD  multivitamin (RENA-VIT) TABS tablet Take 1 tablet by mouth at bedtime. 09/28/14  Yes Ivan Anchors Love, PA-C  sertraline (ZOLOFT) 100 MG tablet Take 1 tablet (100 mg total) by mouth daily. 03/15/15  Yes Carlyle Basques, MD  traMADol (ULTRAM) 50 MG tablet Take by mouth every 6 (six) hours as needed.   Yes Historical Provider, MD  Blood Glucose Monitoring Suppl (ACCU-CHEK AVIVA PLUS) W/DEVICE KIT Check sugars TID for E11.65 01/29/15   Lance Bosch, NP  Elbasvir-Grazoprevir (ZEPATIER) 50-100 MG TABS Take 1 tablet by mouth daily with breakfast. 12/21/14   Carlyle Basques, MD  glucose blood (ACCU-CHEK AVIVA) test strip Check sugars TID for E11.65 01/29/15   Lance Bosch, NP  hydrALAZINE (APRESOLINE) 25 MG tablet Take 1 tablet (25 mg total) by mouth 3 (three) times daily. Patient taking differently: Take 50 mg by mouth 3 (three) times daily.  12/06/14   Lance Bosch, NP  Insulin Syringe-Needle U-100 (BD INSULIN SYRINGE ULTRAFINE) 31G X 5/16" 0.5 ML MISC Inject insulin once per night for E11.65 01/29/15   Lance Bosch, NP  Lancet Devices [facility]) lancets Check sugars TID for E11.65 01/29/15   Lance Bosch, NP     Positive ROS: All other systems have been reviewed and were otherwise negative with the exception of those mentioned in the HPI and as above.  Physical Exam: General: Alert, no acute distress Cardiovascular: No pedal edema Respiratory: No cyanosis, no use of accessory musculature GI: abdomen soft Skin: No lesions in the area of chief complaint Neurologic: Sensation intact distally Psychiatric: Patient is competent for consent with normal mood and affect Lymphatic: no lymphedema  MUSCULOSKELETAL: exam  stable  Assessment: Left knee medial meniscal tear, lateral meniscal tear, synovitis  Plan: Plan for Procedure(s): LEFT KNEE ARTHROSCOPY WITH PARTIAL MEDIAL MENISCECTOMY, PARTIAL LATERAL MENISCECTOMY AND SYNOVECTOMY  The risks benefits and alternatives were discussed with the patient including but not limited to the risks of nonoperative treatment, versus surgical intervention including infection, bleeding, nerve injury,  blood clots, cardiopulmonary complications, morbidity, mortality, among others, and they were willing to proceed.   Marianna Payment, MD   04/04/2015 8:18 AM

## 2015-04-04 NOTE — Discharge Instructions (Signed)
Postoperative instructions:  Weightbearing: as tolerated  Keep your dressing and/or splint clean and dry at all times.  You can remove your dressing on post-operative day #3 and change with a dry/sterile dressing or Band-Aids as needed thereafter.    Incision instructions:  Do not soak your incision for 3 weeks after surgery.  If the incision gets wet, pat dry and do not scrub the incision.  Pain control:  You have been given a prescription to be taken as directed for post-operative pain control.  In addition, elevate the operative extremity above the heart at all times to prevent swelling and throbbing pain.  Take over-the-counter Colace, 100mg  by mouth twice a day while taking narcotic pain medications to help prevent constipation.  Follow up appointments: 1) 10-14 days for suture removal and wound check. 2) Dr. [name] as scheduled.   -------------------------------------------------------------------------------------------------------------  After Surgery Pain Control:  After your surgery, post-surgical discomfort or pain is likely. This discomfort can last several days to a few weeks. At certain times of the day your discomfort may be more intense.  Did you receive a nerve block?  A nerve block can provide pain relief for one hour to two days after your surgery. As long as the nerve block is working, you will experience little or no sensation in the area the surgeon operated on.  As the nerve block wears off, you will begin to experience pain or discomfort. It is very important that you begin taking your prescribed pain medication before the nerve block fully wears off. Treating your pain at the first sign of the block wearing off will ensure your pain is better controlled and more tolerable when full-sensation returns. Do not wait until the pain is intolerable, as the medicine will be less effective. It is better to treat pain in advance than to try and catch up.  General Anesthesia:  If  you did not receive a nerve block during your surgery, you will need to start taking your pain medication shortly after your surgery and should continue to do so as prescribed by your surgeon.  Pain Medication:  Most commonly we prescribe Vicodin and Percocet for post-operative pain. Both of these medications contain a combination of acetaminophen (Tylenol) and a narcotic to help control pain.   It takes between 30 and 45 minutes before pain medication starts to work. It is important to take your medication before your pain level gets too intense.   Nausea is a common side effect of many pain medications. You will want to eat something before taking your pain medicine to help prevent nausea.   If you are taking a prescription pain medication that contains acetaminophen, we recommend that you do not take additional over the counter acetaminophen (Tylenol).  Other pain relieving options:   Using a cold pack to ice the affected area a few times a day (15 to 20 minutes at a time) can help to relieve pain, reduce swelling and bruising.   Elevation of the affected area can also help to reduce pain and swelling.      Post Anesthesia Home Care Instructions  Activity: Get plenty of rest for the remainder of the day. A responsible adult should stay with you for 24 hours following the procedure.  For the next 24 hours, DO NOT: -Drive a car -Paediatric nurse -Drink alcoholic beverages -Take any medication unless instructed by your physician -Make any legal decisions or sign important papers.  Meals: Start with liquid foods such as gelatin or  soup. Progress to regular foods as tolerated. Avoid greasy, spicy, heavy foods. If nausea and/or vomiting occur, drink only clear liquids until the nausea and/or vomiting subsides. Call your physician if vomiting continues.  Special Instructions/Symptoms: Your throat may feel dry or sore from the anesthesia or the breathing tube placed in your throat  during surgery. If this causes discomfort, gargle with warm salt water. The discomfort should disappear within 24 hours.  If you had a scopolamine patch placed behind your ear for the management of post- operative nausea and/or vomiting:  1. The medication in the patch is effective for 72 hours, after which it should be removed.  Wrap patch in a tissue and discard in the trash. Wash hands thoroughly with soap and water. 2. You may remove the patch earlier than 72 hours if you experience unpleasant side effects which may include dry mouth, dizziness or visual disturbances. 3. Avoid touching the patch. Wash your hands with soap and water after contact with the patch.

## 2015-04-04 NOTE — Anesthesia Procedure Notes (Signed)
Procedure Name: LMA Insertion Date/Time: 04/04/2015 10:27 AM Performed by: Danya Spearman D Pre-anesthesia Checklist: Patient identified, Emergency Drugs available, Suction available and Patient being monitored Patient Re-evaluated:Patient Re-evaluated prior to inductionOxygen Delivery Method: Circle System Utilized Preoxygenation: Pre-oxygenation with 100% oxygen Intubation Type: IV induction Ventilation: Mask ventilation without difficulty LMA: LMA inserted LMA Size: 4.0 Number of attempts: 1 Airway Equipment and Method: Bite block Placement Confirmation: positive ETCO2 Tube secured with: Tape Dental Injury: Teeth and Oropharynx as per pre-operative assessment

## 2015-04-04 NOTE — Op Note (Signed)
   Date of surgery: 04/04/2015  Preoperative diagnosis: 1. Recurrent left knee bloody effusions 2. Severe synovitis 3. Medial and lateral meniscus tears  Postoperative diagnosis: Same  Procedure: 1. Left knee arthroscopic partial medial and lateral meniscectomy 2. Left knee arthroscopic extensive synovectomy of the medial, lateral, patellofemoral compartments  Operative findings: 1. Extensive synovitis of the left knee in all compartments 2. Degenerative medial and lateral meniscus tears 3. Deficient anterior cruciate ligament  Surgeon: Eduard Roux, M.D.  Anesthesia: General  Estimated blood loss: Minimal  Tourniquet time: 55 minutes  Complications: None  Condition to PACU: Stable  Indications for procedure: Ms. Sock is a 59 year old who had a severe infection of her left knee joint earlier this year that was treated with multiple surgeries and a protracted course of IV antibiotics. She had been doing well until recently when she presented to my clinic with recurrent knee effusions. An aspiration of the left knee revealed a bloody effusion that was negative for gout, infection, inflammatory arthritides.  An MRI was also performed that showed degenerative meniscal tears as well as extensive and severe synovitis. After full discussion of her treatment options we decided to proceed with a diagnostic arthroscopy of the left knee and treatment as indicated based on findings. She was aware of the risks, benefits, and alternatives to surgery including incomplete relief pain, recurrent effusions, infection.  Description of procedure: The patient was identified in the preoperative holding area. The operative site was marked by the surgeon and confirmed with the patient. She is brought back to the operating room. She was placed supine on the table. General anesthesia was induced. Nonsterile tourniquet was placed on the upper left thigh. A timeout was performed. Operative extremity was prepped  and draped in standard sterile fashion. Preoperative antibiotics were given. The leg was elevated and the tourniquet was inflated to 350 mmHg. The standard anteromedial anterolateral arthroscopy portals were established. There was a large amount of bloody return. After we were able to finally gain visualization of the knee joint after a thorough lavage we were able to visualize extensive synovitis throughout her knee joint and the medial, lateral, patellofemoral compartments. She also had degenerative meniscal tears of both her medial and lateral menisci. We then performed an extensive synovectomy of her medial, lateral, patellofemoral compartments with an oscillating shaver and ArthroCare wand. After we perform the synovectomy we then performed partial medial and lateral meniscectomies with oscillating shaver.  She also had a deficient anterior cruciate ligament. There was no signs of infection or frank pus within the knee joint. The tourniquet was then deflated. Hemostasis was obtained. Final arthroscopic pictures were taken. The incisions were closed with interrupted 3-0 nylon sutures. Sterile dressings were applied. Patient tolerated the procedure well was extubated and transferred to the PACU in stable condition.  Disposition: Patient will be weight bear as tolerated to the left lower extremity. We will see her back in the office in 2 weeks for suture removal.  N. Eduard Roux, MD [place] 11:53 AM

## 2015-04-04 NOTE — Anesthesia Postprocedure Evaluation (Signed)
  Anesthesia Post-op Note  Patient: Brandy Houston  Procedure(s) Performed: Procedure(s) (LRB): LEFT KNEE ARTHROSCOPY WITH PARTIAL MEDIAL MENISCECTOMY  AND SYNOVECTOMY (Left) AND PARTIAL LATERAL MENISECTOMY (Left)  Patient Location: PACU  Anesthesia Type: General  Level of Consciousness: awake and alert   Airway and Oxygen Therapy: Patient Spontanous Breathing  Post-op Pain: mild  Post-op Assessment: Post-op Vital signs reviewed, Patient's Cardiovascular Status Stable, Respiratory Function Stable, Patent Airway and No signs of Nausea or vomiting  Last Vitals:  Filed Vitals:   04/04/15 1215  BP: 149/69  Pulse: 77  Temp:   Resp: 16    Post-op Vital Signs: stable   Complications: No apparent anesthesia complications

## 2015-04-04 NOTE — Transfer of Care (Signed)
Immediate Anesthesia Transfer of Care Note  Patient: Brandy Houston  Procedure(s) Performed: Procedure(s): LEFT KNEE ARTHROSCOPY WITH PARTIAL MEDIAL MENISCECTOMY  AND SYNOVECTOMY (Left) AND PARTIAL LATERAL MENISECTOMY (Left)  Patient Location: PACU  Anesthesia Type:General  Level of Consciousness: awake, alert , oriented and patient cooperative  Airway & Oxygen Therapy: Patient Spontanous Breathing and Patient connected to face mask oxygen  Post-op Assessment: Report given to RN and Post -op Vital signs reviewed and stable  Post vital signs: Reviewed and stable  Last Vitals:  Filed Vitals:   04/04/15 0918  BP: 159/69  Pulse: 74  Temp: 36.8 C  Resp: 16    Complications: No apparent anesthesia complications

## 2015-04-05 ENCOUNTER — Encounter (HOSPITAL_BASED_OUTPATIENT_CLINIC_OR_DEPARTMENT_OTHER): Payer: Self-pay | Admitting: Orthopaedic Surgery

## 2015-04-16 DIAGNOSIS — E1129 Type 2 diabetes mellitus with other diabetic kidney complication: Secondary | ICD-10-CM | POA: Diagnosis not present

## 2015-04-16 DIAGNOSIS — M25462 Effusion, left knee: Secondary | ICD-10-CM | POA: Diagnosis not present

## 2015-04-16 DIAGNOSIS — B1920 Unspecified viral hepatitis C without hepatic coma: Secondary | ICD-10-CM | POA: Diagnosis not present

## 2015-04-16 DIAGNOSIS — N184 Chronic kidney disease, stage 4 (severe): Secondary | ICD-10-CM | POA: Diagnosis not present

## 2015-04-16 DIAGNOSIS — G629 Polyneuropathy, unspecified: Secondary | ICD-10-CM | POA: Diagnosis not present

## 2015-04-16 DIAGNOSIS — D631 Anemia in chronic kidney disease: Secondary | ICD-10-CM | POA: Diagnosis not present

## 2015-04-16 DIAGNOSIS — I129 Hypertensive chronic kidney disease with stage 1 through stage 4 chronic kidney disease, or unspecified chronic kidney disease: Secondary | ICD-10-CM | POA: Diagnosis not present

## 2015-04-16 DIAGNOSIS — N2581 Secondary hyperparathyroidism of renal origin: Secondary | ICD-10-CM | POA: Diagnosis not present

## 2015-04-17 DIAGNOSIS — M23307 Other meniscus derangements, unspecified meniscus, left knee: Secondary | ICD-10-CM | POA: Diagnosis not present

## 2015-04-17 DIAGNOSIS — M00262 Other streptococcal arthritis, left knee: Secondary | ICD-10-CM | POA: Diagnosis not present

## 2015-04-17 DIAGNOSIS — M23322 Other meniscus derangements, posterior horn of medial meniscus, left knee: Secondary | ICD-10-CM | POA: Diagnosis not present

## 2015-05-29 ENCOUNTER — Ambulatory Visit: Payer: Medicare Other | Admitting: Internal Medicine

## 2015-06-12 ENCOUNTER — Other Ambulatory Visit: Payer: Medicare Other

## 2015-06-12 DIAGNOSIS — B1710 Acute hepatitis C without hepatic coma: Secondary | ICD-10-CM

## 2015-06-12 DIAGNOSIS — N184 Chronic kidney disease, stage 4 (severe): Secondary | ICD-10-CM | POA: Diagnosis not present

## 2015-06-12 DIAGNOSIS — N189 Chronic kidney disease, unspecified: Secondary | ICD-10-CM | POA: Diagnosis not present

## 2015-06-13 LAB — HEPATITIS C RNA QUANTITATIVE: HCV Quantitative: NOT DETECTED IU/mL (ref <15)

## 2015-06-17 DIAGNOSIS — Z9289 Personal history of other medical treatment: Secondary | ICD-10-CM

## 2015-06-17 HISTORY — DX: Personal history of other medical treatment: Z92.89

## 2015-06-26 ENCOUNTER — Encounter (HOSPITAL_COMMUNITY): Payer: Self-pay | Admitting: Emergency Medicine

## 2015-06-26 ENCOUNTER — Emergency Department (HOSPITAL_COMMUNITY)
Admission: EM | Admit: 2015-06-26 | Discharge: 2015-06-27 | Disposition: A | Discharge status: Home / Self Care | Payer: Medicare Other | Attending: Emergency Medicine | Admitting: Emergency Medicine

## 2015-06-26 DIAGNOSIS — Z992 Dependence on renal dialysis: Secondary | ICD-10-CM | POA: Insufficient documentation

## 2015-06-26 DIAGNOSIS — R791 Abnormal coagulation profile: Secondary | ICD-10-CM | POA: Insufficient documentation

## 2015-06-26 DIAGNOSIS — G629 Polyneuropathy, unspecified: Secondary | ICD-10-CM | POA: Insufficient documentation

## 2015-06-26 DIAGNOSIS — Z8619 Personal history of other infectious and parasitic diseases: Secondary | ICD-10-CM | POA: Diagnosis not present

## 2015-06-26 DIAGNOSIS — F419 Anxiety disorder, unspecified: Secondary | ICD-10-CM | POA: Insufficient documentation

## 2015-06-26 DIAGNOSIS — F329 Major depressive disorder, single episode, unspecified: Secondary | ICD-10-CM | POA: Insufficient documentation

## 2015-06-26 DIAGNOSIS — N049 Nephrotic syndrome with unspecified morphologic changes: Secondary | ICD-10-CM | POA: Diagnosis not present

## 2015-06-26 DIAGNOSIS — R188 Other ascites: Secondary | ICD-10-CM | POA: Diagnosis not present

## 2015-06-26 DIAGNOSIS — Z794 Long term (current) use of insulin: Secondary | ICD-10-CM | POA: Insufficient documentation

## 2015-06-26 DIAGNOSIS — Z7982 Long term (current) use of aspirin: Secondary | ICD-10-CM | POA: Insufficient documentation

## 2015-06-26 DIAGNOSIS — M7989 Other specified soft tissue disorders: Secondary | ICD-10-CM | POA: Diagnosis not present

## 2015-06-26 DIAGNOSIS — R601 Generalized edema: Secondary | ICD-10-CM | POA: Diagnosis not present

## 2015-06-26 DIAGNOSIS — F17200 Nicotine dependence, unspecified, uncomplicated: Secondary | ICD-10-CM | POA: Diagnosis not present

## 2015-06-26 DIAGNOSIS — R7989 Other specified abnormal findings of blood chemistry: Secondary | ICD-10-CM

## 2015-06-26 DIAGNOSIS — I10 Essential (primary) hypertension: Secondary | ICD-10-CM | POA: Insufficient documentation

## 2015-06-26 DIAGNOSIS — E119 Type 2 diabetes mellitus without complications: Secondary | ICD-10-CM | POA: Diagnosis not present

## 2015-06-26 DIAGNOSIS — Z79899 Other long term (current) drug therapy: Secondary | ICD-10-CM | POA: Insufficient documentation

## 2015-06-26 DIAGNOSIS — K219 Gastro-esophageal reflux disease without esophagitis: Secondary | ICD-10-CM | POA: Insufficient documentation

## 2015-06-26 LAB — BASIC METABOLIC PANEL
Anion gap: 11 (ref 5–15)
BUN: 77 mg/dL — ABNORMAL HIGH (ref 6–20)
CO2: 22 mmol/L (ref 22–32)
Calcium: 8.7 mg/dL — ABNORMAL LOW (ref 8.9–10.3)
Chloride: 106 mmol/L (ref 101–111)
Creatinine, Ser: 3.33 mg/dL — ABNORMAL HIGH (ref 0.44–1.00)
GFR calc Af Amer: 16 mL/min — ABNORMAL LOW (ref >60)
GFR calc non Af Amer: 14 mL/min — ABNORMAL LOW (ref >60)
Glucose, Bld: 111 mg/dL — ABNORMAL HIGH (ref 65–99)
Potassium: 4.9 mmol/L (ref 3.5–5.1)
Sodium: 139 mmol/L (ref 135–145)

## 2015-06-26 LAB — CBC
HCT: 28.7 % — ABNORMAL LOW (ref 36.0–46.0)
Hemoglobin: 9.3 g/dL — ABNORMAL LOW (ref 12.0–15.0)
MCH: 30.1 pg (ref 26.0–34.0)
MCHC: 32.4 g/dL (ref 30.0–36.0)
MCV: 92.9 fL (ref 78.0–100.0)
Platelets: 298 10*3/uL (ref 150–400)
RBC: 3.09 MIL/uL — ABNORMAL LOW (ref 3.87–5.11)
RDW: 16.5 % — ABNORMAL HIGH (ref 11.5–15.5)
WBC: 7.1 10*3/uL (ref 4.0–10.5)

## 2015-06-26 NOTE — ED Notes (Signed)
Pt states for the last week she has had increased swelling over her entire body. Started in her legs and now has swelling in her face that's started 2 days ago. Pt states she was on dialysis until 1 month ago when her kidney function had return to normal. Pt denies any pain or SOB.

## 2015-06-26 NOTE — ED Provider Notes (Signed)
CSN: [id number]     Arrival date & time 06/26/15  1720 History  By signing my name below, I, Brandy Houston, attest that this documentation has been prepared under the direction and in the presence of [name], MD . Electronically Signed: Evelene Houston, Scribe. 06/26/2015. 11:26 PM.    Chief Complaint  Patient presents with  . Leg Swelling  . Facial Swelling   The history is provided by the patient. No language interpreter was used.    HPI Comments:  Brandy Houston is a 60 y.o. female with a history of HTN, and left knee arthroscopy (04/04/2015), who presents to the Emergency Department complaining of chronic LLE swelling intermittent since October 2016. Pt reports swelling from the left knee, up to breast. She reports associated left knee and LLE pain. Pt denies SOB and fever. No alleviating factors noted.  Pt states she was recent evaluated by orthopedist for the swelling, who informed her that the issue may be medical and she should be evaluated in the ED. She has a h/o dialysis; has been off for ~ 3 months. She notes she recently had lab work that showed her kidney function was ~ 37%; lab work done ~ 1 week ago.  Patient reports the swelling has actually been off and on for the last several months. Denies any chest pain, shortness of breath. Denies any pain.  Nephrologist- Wynonia Musty; has appointment on the Jul 02, 2015  [place]  Past Medical History  Diagnosis Date  . Hypertension   . Peripheral neuropathy ([place])   . Hepatitis C 2016  . Diabetes mellitus     type 2, IDDM  . Depression   . Anxiety   . Chronic kidney disease   . GERD (gastroesophageal reflux disease)   . Septic arthritis ([place])   . Dysrhythmia     tachycardia, normal ECHO 08-09-14  . Hemodialysis patient [facility])    Past Surgical History  Procedure Laterality Date  . Cholecystectomy    . Appendectomy    . Small intestine surgery      Due to Small Bowel Obstruction  . Tubal ligation    . Knee  arthroscopy Right 08/10/2014    Procedure: ARTHROSCOPY I & D KNEE;  Surgeon: Marianna Payment, MD;  Location: [facility];  Service: Orthopedics;  Laterality: Right;  . Shoulder arthroscopy Bilateral 08/10/2014    Procedure: I & D BILATERAL SHOULDERS ;  Surgeon: Marianna Payment, MD;  Location: [facility];  Service: Orthopedics;  Laterality: Bilateral;  . Tenosynovectomy Right 08/11/2014    Procedure: RIGHT WRIST IRRIGATION AND DEBRIDEMENT, TENOSYNOVECTOMY;  Surgeon: Marianna Payment, MD;  Location: [place];  Service: Orthopedics;  Laterality: Right;  . Knee arthroscopy Left 08/11/2014    Procedure: ARTHROSCOPIC WASHOUT LEFT KNEE;  Surgeon: Marianna Payment, MD;  Location: [place];  Service: Orthopedics;  Laterality: Left;  . Tee without cardioversion N/A 08/14/2014    Procedure: TRANSESOPHAGEAL ECHOCARDIOGRAM (TEE);  Surgeon: Thayer Headings, MD;  Location: [place];  Service: Cardiovascular;  Laterality: N/A;  . Knee arthroscopy Left 08/19/2014    Procedure: ARTHROSCOPIC WASHOUT LEFT KNEE;  Surgeon: Leandrew Koyanagi, MD;  Location: [place];  Service: Orthopedics;  Laterality: Left;  . Av fistula placement Left 09/13/2014    Procedure: Brachial Artery to Brachial Vein Gortex Four - Seven Stretch GRAFT INSERTION Left Forearm;  Surgeon: Mal Misty, MD;  Location: [place];  Service: Vascular;  Laterality: Left;  . Knee arthroscopy with medial menisectomy  Left 04/04/2015    Procedure: LEFT KNEE ARTHROSCOPY WITH PARTIAL MEDIAL MENISCECTOMY  AND SYNOVECTOMY;  Surgeon: Leandrew Koyanagi, MD;  Location: [place];  Service: Orthopedics;  Laterality: Left;  . Knee arthroscopy with lateral menisectomy Left 04/04/2015    Procedure: AND PARTIAL LATERAL MENISECTOMY;  Surgeon: Leandrew Koyanagi, MD;  Location: [place];  Service: Orthopedics;  Laterality: Left;   Family History  Problem Relation Age of Onset  . Cancer Mother   . Heart disease Father   . Cancer Sister   . Cancer Brother   .  Diabetes Brother    Social History  Substance Use Topics  . Smoking status: Current Every Day Smoker -- 0.50 packs/day for 20 years  . Smokeless tobacco: Never Used     Comment: Smoking 7-8 cigs per day  . Alcohol Use: No     Comment: per pt no alcohol since 09/2014   OB History    No data available     Review of Systems  Constitutional: Negative for fever.  Respiratory: Negative for shortness of breath.   Cardiovascular: Positive for leg swelling.  Gastrointestinal: Negative for nausea, vomiting and abdominal pain.  Genitourinary: Negative for difficulty urinating.  All other systems reviewed and are negative.   Allergies  Compazine; Omnipaque; Shellfish-derived products; and Iodinated diagnostic agents  Home Medications   Prior to Admission medications   Medication Sig Start Date End Date Taking? Authorizing Provider  acetaminophen (TYLENOL) 500 MG tablet Take 1,000 mg by mouth every 6 (six) hours as needed for mild pain.   Yes Historical Provider, MD  amLODipine (NORVASC) 10 MG tablet Take 10 mg by mouth daily.   Yes Historical Provider, MD  aspirin 81 MG chewable tablet Chew 81 mg by mouth daily.    Yes Historical Provider, MD  Blood Glucose Monitoring Suppl (ACCU-CHEK AVIVA PLUS) W/DEVICE KIT Check sugars TID for E11.65 01/29/15  Yes Lance Bosch, NP  calcium carbonate (TUMS E-X 750) 750 MG chewable tablet Chew 1 tablet (750 mg total) by mouth daily. With your largest meal Patient taking differently: Chew 1 tablet by mouth 3 (three) times daily before meals.  09/29/14  Yes Ivan Anchors Love, PA-C  diphenhydrAMINE (BENADRYL) 25 mg capsule Take 1 capsule (25 mg total) by mouth every 6 (six) hours as needed for itching. 09/28/14  Yes Ivan Anchors Love, PA-C  furosemide (LASIX) 80 MG tablet Take 80-160 mg by mouth 2 (two) times daily. Take 145m in the morning and 868min the evening   Yes Historical Provider, MD  gabapentin (NEURONTIN) 100 MG capsule Take 1 capsule (100 mg total) by  mouth 2 (two) times daily. 12/06/14  Yes VaLance BoschNP  glucose blood (ACCU-CHEK AVIVA) test strip Check sugars TID for E11.65 01/29/15  Yes VaLance BoschNP  hydrALAZINE (APRESOLINE) 25 MG tablet Take 1 tablet (25 mg total) by mouth 3 (three) times daily. Patient taking differently: Take 50 mg by mouth 3 (three) times daily.  12/06/14  Yes VaLance BoschNP  HYDROcodone-acetaminophen (NORCO) 7.5-325 MG tablet Take 1-2 tablets by mouth every 6 (six) hours as needed for moderate pain. 04/04/15  Yes Naiping M Ephriam JenkinsMD  Insulin Glargine (LANTUS SOLOSTAR) 100 UNIT/ML Solostar Pen Inject 5 Units into the skin daily at 10 pm. 11/14/14  Yes VaLance BoschNP  Insulin Syringe-Needle U-100 (BD INSULIN SYRINGE ULTRAFINE) 31G X 5/16" 0.5 ML MISC Inject insulin once per night for E11.65 01/29/15  Yes Lance Bosch, NP  Lancet Devices [facility]) lancets Check sugars TID for E11.65 01/29/15  Yes Lance Bosch, NP  metoprolol (LOPRESSOR) 50 MG tablet Take 50 mg by mouth at bedtime.    Yes Historical Provider, MD  Multiple Vitamin (MULTIVITAMIN WITH MINERALS) TABS tablet Take 1 tablet by mouth daily.   Yes Historical Provider, MD  multivitamin (RENA-VIT) TABS tablet Take 1 tablet by mouth at bedtime. 09/28/14  Yes Ivan Anchors Love, PA-C  sertraline (ZOLOFT) 100 MG tablet Take 1 tablet (100 mg total) by mouth daily. 03/15/15  Yes Carlyle Basques, MD  traMADol (ULTRAM) 50 MG tablet Take 50 mg by mouth every 6 (six) hours as needed for moderate pain.    Yes Historical Provider, MD   BP 158/73 mmHg  Pulse 102  Temp(Src) 98.1 F (36.7 C) (Oral)  Resp 12  Ht _0  (1.651 m)  Wt 203 lb 3.2 oz (92.171 kg)  BMI 33.81 kg/m2  SpO2 98%  LMP 03/16/2002 Physical Exam  Constitutional: She is oriented to person, place, and time. No distress.  HENT:  Head: Normocephalic and atraumatic.  Eyes: Pupils are equal, round, and reactive to light.  Cardiovascular: Normal rate, regular rhythm and normal heart sounds.    No murmur heard. Fistula left upper extremity positive thrill  Pulmonary/Chest: Effort normal and breath sounds normal. No respiratory distress. She has no wheezes.  Abdominal: Soft. Bowel sounds are normal.  Musculoskeletal:  Diffuse bilateral lower extremity and abdominal swelling consistent with anasarca, left greater than right knee swelling with pain with range of motion, no overlying skin changes, redness, or erythema, no obvious facial swelling  Neurological: She is alert and oriented to person, place, and time.  Skin: Skin is warm and dry.  Psychiatric: She has a normal mood and affect.  Nursing note and vitals reviewed.   ED Course  Procedures   DIAGNOSTIC STUDIES:  Oxygen Saturation is 100% on RA, normal by my interpretation.    COORDINATION OF CARE:  11:20 PM Discussed treatment plan with pt at bedside and pt agreed to plan.  Labs Review Labs Reviewed  BASIC METABOLIC PANEL - Abnormal; Notable for the following:    Glucose, Bld 111 (*)    BUN 77 (*)    Creatinine, Ser 3.33 (*)    Calcium 8.7 (*)    GFR calc non Af Amer 14 (*)    GFR calc Af Amer 16 (*)    All other components within normal limits  CBC - Abnormal; Notable for the following:    RBC 3.09 (*)    Hemoglobin 9.3 (*)    HCT 28.7 (*)    RDW 16.5 (*)    All other components within normal limits  D-DIMER, QUANTITATIVE (NOT AT [facility]) - Abnormal; Notable for the following:    D-Dimer, Quant 6.74 (*)    All other components within normal limits  URINALYSIS, ROUTINE W REFLEX MICROSCOPIC (NOT AT [facility]) - Abnormal; Notable for the following:    APPearance CLOUDY (*)    Protein, ur 30 (*)    Leukocytes, UA SMALL (*)    All other components within normal limits  ALBUMIN - Abnormal; Notable for the following:    Albumin 2.7 (*)    All other components within normal limits  URINE MICROSCOPIC-ADD ON - Abnormal; Notable for the following:    Squamous Epithelial / LPF 0-5 (*)    Bacteria, UA MANY (*)    All  other components within normal limits  HEPATIC FUNCTION PANEL - Abnormal; Notable for the following:    Albumin 2.8 (*)    Alkaline Phosphatase 194 (*)    All other components within normal limits    Imaging Review Ct Abdomen Pelvis Wo Contrast  06/27/2015  CLINICAL DATA:  60 year old female with diffuse swelling and anasarca. Patient was on dialysis and tendon month ago. EXAM: CT ABDOMEN AND PELVIS WITHOUT CONTRAST TECHNIQUE: Multidetector CT imaging of the abdomen and pelvis was performed following the standard protocol without IV contrast. COMPARISON:  Radiograph dated 08/29/2014 FINDINGS: Evaluation of this exam is limited in the absence of intravenous contrast. Small left pleural effusion. The visualized lung bases are clear. No intra-abdominal free air. Trace perihepatic free fluid as well as trace free fluid within the pelvis. Cholecystectomy. The liver, pancreas, spleen, adrenal glands, kidneys, visualized ureters, and urinary bladder appear unremarkable. The uterus is not well visualized. Bilateral tubal ligation clips noted. Constipation. There is colonic diverticulosis without definite evidence of active inflammation. No evidence of bowel obstruction. A tubular appearing structure in the right lower quadrant (series 201 images 52 - 55) may represent a normal appendix. The abdominal aorta and IVC are grossly unremarkable on this noncontrast study. No portal venous gas identified. There is no adenopathy. Caps there is diffuse subcutaneous soft tissue edema and anasarca. There is compression fracture of the superior endplate of the L1 vertebra, age indeterminate. Clinical correlation is recommended. Old compression fracture of the superior endplate of the L3. IMPRESSION: No hydronephrosis or nephrolithiasis. Constipation.  No evidence of bowel obstruction. Diverticulosis. Small ascites and anasarca. L1 compression fracture, age indeterminate. Clinical correlation is recommended. Electronically  Signed   By: Anner Crete M.D.   On: 06/27/2015 02:49   I have personally reviewed and evaluated these images and lab results as part of my medical decision-making.   MDM   Final diagnoses:  Anasarca associated with disorder of kidney  Leg swelling  Positive D dimer    Patient presents with edema and anasarca. Chronicity of symptoms is somewhat unclear but appears to be subacute. Patient saw her orthopedist and was encouraged to seek medical attention. She is nontoxic on exam. Denies chest pain or shortness of breath. She would certainly be at risk for a DVT given her recent surgery. She does have worse left lower extremity swelling. However, anasarca is noted in the bilateral thighs and abdomen as well. This may be related to her renal disease or liver dysfunction.  Basic labwork was obtained. Creatinine is somewhat increased from prior at 3.33. Prior baseline around 2.  Albumin is low and she has 30 protein in the urine. This could be contributing to her edema. D-dimer is positive at 6.74. Do not have access to ultrasound at this time. I discussed with the pharmacy given patient's renal function, they recommend 1 mg/kg of Lovenox to cover the patient until she can receive an ultrasound. A noncontrasted CT scan of the abdomen and pelvis was obtained to evaluate for any gross abnormality, mass, or any other possible etiology of anasarca. This was closely unremarkable. Discussed findings with the patient. Ordered a DVT study for later today and patient was instructed to return. Encouraged close follow-up with nephrology and patient's infectious disease as anasarca is likely related to chronic kidney and/or liver disease. Patient stated understanding.  After history, exam, and medical workup I feel the patient has been appropriately medically screened and is safe for discharge home. Pertinent diagnoses were discussed with the patient. Patient was given return precautions.  I personally performed  the services described in this documentation, which was scribed in my presence. The recorded information has been reviewed and is accurate.    [name], MD 06/27/15 [phone]

## 2015-06-27 ENCOUNTER — Encounter (HOSPITAL_COMMUNITY): Payer: Self-pay | Admitting: Radiology

## 2015-06-27 ENCOUNTER — Emergency Department (HOSPITAL_COMMUNITY): Payer: Medicare Other

## 2015-06-27 ENCOUNTER — Ambulatory Visit (EMERGENCY_DEPARTMENT_HOSPITAL)
Admission: RE | Admit: 2015-06-27 | Discharge: 2015-06-27 | Disposition: A | Discharge status: Home / Self Care | Payer: Medicare Other | Source: Ambulatory Visit | Attending: Emergency Medicine | Admitting: Emergency Medicine

## 2015-06-27 DIAGNOSIS — M7989 Other specified soft tissue disorders: Secondary | ICD-10-CM | POA: Diagnosis not present

## 2015-06-27 DIAGNOSIS — R188 Other ascites: Secondary | ICD-10-CM | POA: Diagnosis not present

## 2015-06-27 DIAGNOSIS — N049 Nephrotic syndrome with unspecified morphologic changes: Secondary | ICD-10-CM | POA: Diagnosis not present

## 2015-06-27 LAB — URINALYSIS, ROUTINE W REFLEX MICROSCOPIC
Bilirubin Urine: NEGATIVE
Glucose, UA: NEGATIVE mg/dL
Hgb urine dipstick: NEGATIVE
Ketones, ur: NEGATIVE mg/dL
Nitrite: NEGATIVE
Protein, ur: 30 mg/dL — AB
Specific Gravity, Urine: 1.011 (ref 1.005–1.030)
pH: 5 (ref 5.0–8.0)

## 2015-06-27 LAB — URINE MICROSCOPIC-ADD ON

## 2015-06-27 LAB — ALBUMIN: Albumin: 2.7 g/dL — ABNORMAL LOW (ref 3.5–5.0)

## 2015-06-27 LAB — HEPATIC FUNCTION PANEL
ALT: 27 U/L (ref 14–54)
AST: 21 U/L (ref 15–41)
Albumin: 2.8 g/dL — ABNORMAL LOW (ref 3.5–5.0)
Alkaline Phosphatase: 194 U/L — ABNORMAL HIGH (ref 38–126)
Bilirubin, Direct: 0.2 mg/dL (ref 0.1–0.5)
Indirect Bilirubin: 0.8 mg/dL (ref 0.3–0.9)
Total Bilirubin: 1 mg/dL (ref 0.3–1.2)
Total Protein: 7.2 g/dL (ref 6.5–8.1)

## 2015-06-27 LAB — D-DIMER, QUANTITATIVE: D-Dimer, Quant: 6.74 ug/mL-FEU — ABNORMAL HIGH (ref 0.00–0.50)

## 2015-06-27 MED ORDER — ENOXAPARIN SODIUM 100 MG/ML ~~LOC~~ SOLN
1.0000 mg/kg | Freq: Once | SUBCUTANEOUS | Status: AC
  Administered 2015-06-27: 90 mg via SUBCUTANEOUS
  Filled 2015-06-27: qty 1

## 2015-06-27 NOTE — Discharge Instructions (Signed)
You were seen today for swelling. This may be related to your kidney and/or your liver disease. The rest of your workup was largely reassuring. You did have a positive test for blood clots. You need to return tomorrow for imaging of your lower extremity.  You need to follow-up with your kidney and infectious disease doctors.  Edema Edema is an abnormal buildup of fluids in your bodytissues. Edema is somewhatdependent on gravity to pull the fluid to the lowest place in your body. That makes the condition more common in the legs and thighs (lower extremities). Painless swelling of the feet and ankles is common and becomes more likely as you get older. It is also common in looser tissues, like around your eyes.  When the affected area is squeezed, the fluid may move out of that spot and leave a dent for a few moments. This dent is called pitting.  CAUSES  There are many possible causes of edema. Eating too much salt and being on your feet or sitting for a long time can cause edema in your legs and ankles. Hot weather may make edema worse. Common medical causes of edema include:  Heart failure.  Liver disease.  Kidney disease.  Weak blood vessels in your legs.  Cancer.  An injury.  Pregnancy.  Some medications.  Obesity. SYMPTOMS  Edema is usually painless.Your skin may look swollen or shiny.  DIAGNOSIS  Your health care provider may be able to diagnose edema by asking about your medical history and doing a physical exam. You may need to have tests such as X-rays, an electrocardiogram, or blood tests to check for medical conditions that may cause edema.  TREATMENT  Edema treatment depends on the cause. If you have heart, liver, or kidney disease, you need the treatment appropriate for these conditions. General treatment may include:  Elevation of the affected body part above the level of your heart.  Compression of the affected body part. Pressure from elastic bandages or support  stockings squeezes the tissues and forces fluid back into the blood vessels. This keeps fluid from entering the tissues.  Restriction of fluid and salt intake.  Use of a water pill (diuretic). These medications are appropriate only for some types of edema. They pull fluid out of your body and make you urinate more often. This gets rid of fluid and reduces swelling, but diuretics can have side effects. Only use diuretics as directed by your health care provider. HOME CARE INSTRUCTIONS   Keep the affected body part above the level of your heart when you are lying down.   Do not sit still or stand for prolonged periods.   Do not put anything directly under your knees when lying down.  Do not wear constricting clothing or garters on your upper legs.   Exercise your legs to work the fluid back into your blood vessels. This may help the swelling go down.   Wear elastic bandages or support stockings to reduce ankle swelling as directed by your health care provider.   Eat a low-salt diet to reduce fluid if your health care provider recommends it.   Only take medicines as directed by your health care provider. SEEK MEDICAL CARE IF:   Your edema is not responding to treatment.  You have heart, liver, or kidney disease and notice symptoms of edema.  You have edema in your legs that does not improve after elevating them.   You have sudden and unexplained weight gain. [place]  IF:   You develop shortness of breath or chest pain.   You cannot breathe when you lie down.  You develop pain, redness, or warmth in the swollen areas.   You have heart, liver, or kidney disease and suddenly get edema.  You have a fever and your symptoms suddenly get worse. MAKE SURE YOU:   Understand these instructions.  Will watch your condition.  Will get help right away if you are not doing well or get worse.   This information is not intended to replace advice given to you  by your health care provider. Make sure you discuss any questions you have with your health care provider.   Document Released: 06/02/2005 Document Revised: 06/23/2014 Document Reviewed: 03/25/2013 Elsevier Interactive Patient Education [employer].

## 2015-06-27 NOTE — ED Notes (Signed)
Pt transported to CT ?

## 2015-06-27 NOTE — Progress Notes (Signed)
*  PRELIMINARY RESULTS* Vascular Ultrasound Lower extremity venous duplex has been completed.  Preliminary findings: Left = No evidence of DVT or baker's cyst. Pulsatile venous flow suggestive of increased right sided heart pressure.   Landry Mellow, RDMS, RVT  06/27/2015, 9:00 AM

## 2015-06-29 ENCOUNTER — Ambulatory Visit (INDEPENDENT_AMBULATORY_CARE_PROVIDER_SITE_OTHER): Payer: Medicare Other | Admitting: Internal Medicine

## 2015-06-29 VITALS — BP 148/68 | HR 72 | Temp 98.7°F

## 2015-06-29 DIAGNOSIS — M25562 Pain in left knee: Secondary | ICD-10-CM

## 2015-06-29 DIAGNOSIS — Z8739 Personal history of other diseases of the musculoskeletal system and connective tissue: Secondary | ICD-10-CM | POA: Diagnosis not present

## 2015-06-29 LAB — C-REACTIVE PROTEIN: CRP: 1.2 mg/dL — ABNORMAL HIGH (ref <0.60)

## 2015-06-29 MED ORDER — OXYCODONE HCL 5 MG PO TABS: 5.0000 mg | ORAL_TABLET | Freq: Four times a day (QID) | ORAL | Status: DC | PRN

## 2015-06-29 NOTE — Progress Notes (Signed)
Rfv: knee pain and leg swelling Subjective:    [name], female    DOB: September 11, 1955, 60 y.o.   MRN: [id number]  HPI 60yo F with history of disseminated MSSA bacteremia and sepitc arthritis. She sustained ATN where she was temporarily on HD, now has CKD 4. She folllows with Dr [name] for kidney follow up. In regards to her knees, she notices that she still has pain to both knees. Occasionally feel warm. She has not seen Dr. [name] recently. No fever, chills, nightsweats  Allergies  Allergen Reactions  . Compazine [Prochlorperazine] Shortness Of Breath and Swelling    TONGUE SWELLS  . Omnipaque [Iohexol] Hives  . Shellfish-Derived Products Anaphylaxis  . Iodinated Diagnostic Agents Rash   Current Outpatient Prescriptions on File Prior to Visit  Medication Sig Dispense Refill  . acetaminophen (TYLENOL) 500 MG tablet Take 1,000 mg by mouth every 6 (six) hours as needed for mild pain.    [name] amLODipine (NORVASC) 10 MG tablet Take 10 mg by mouth daily.    [name] aspirin 81 MG chewable tablet Chew 81 mg by mouth daily.     . Blood Glucose Monitoring Suppl (ACCU-CHEK AVIVA PLUS) W/DEVICE KIT Check sugars TID for E11.65 1 kit 0  . calcium carbonate (TUMS E-X 750) 750 MG chewable tablet Chew 1 tablet (750 mg total) by mouth daily. With your largest meal (Patient taking differently: Chew 1 tablet by mouth 3 (three) times daily before meals. )    . diphenhydrAMINE (BENADRYL) 25 mg capsule Take 1 capsule (25 mg total) by mouth every 6 (six) hours as needed for itching. 30 capsule 0  . furosemide (LASIX) 80 MG tablet Take 80-160 mg by mouth 2 (two) times daily. Take 182m in the morning and 854min the evening    . gabapentin (NEURONTIN) 100 MG capsule Take 1 capsule (100 mg total) by mouth 2 (two) times daily. 60 capsule 2  . glucose blood (ACCU-CHEK AVIVA) test strip Check sugars TID for E11.65 100 each 12  . hydrALAZINE (APRESOLINE) 25 MG tablet Take 1 tablet (25 mg total) by mouth 3 (three) times  daily. (Patient taking differently: Take 50 mg by mouth 3 (three) times daily. ) 90 tablet 2  . HYDROcodone-acetaminophen (NORCO) 7.5-325 MG tablet Take 1-2 tablets by mouth every 6 (six) hours as needed for moderate pain. 40 tablet 0  . Insulin Glargine (LANTUS SOLOSTAR) 100 UNIT/ML Solostar Pen Inject 5 Units into the skin daily at 10 pm. 5 pen 3  . Insulin Syringe-Needle U-100 (BD INSULIN SYRINGE ULTRAFINE) 31G X 5/16" 0.5 ML MISC Inject insulin once per night for E11.65 100 each 10  . Lancet Devices (ACCU-CHEK SOFTCLIX) lancets Check sugars TID for E11.65 1 each 10  . metoprolol (LOPRESSOR) 50 MG tablet Take 50 mg by mouth at bedtime.     . Multiple Vitamin (MULTIVITAMIN WITH MINERALS) TABS tablet Take 1 tablet by mouth daily.    . multivitamin (RENA-VIT) TABS tablet Take 1 tablet by mouth at bedtime. 30 tablet 1  . sertraline (ZOLOFT) 100 MG tablet Take 1 tablet (100 mg total) by mouth daily. 30 tablet 6  . traMADol (ULTRAM) 50 MG tablet Take 50 mg by mouth every 6 (six) hours as needed for moderate pain.      No current facility-administered medications on file prior to visit.   Active Ambulatory Problems    Diagnosis Date Noted  . Chest pain at rest 10/18/2011  . Shortness of breath 10/18/2011  . Uncontrolled  hypertension 02/18/2013  . Type II or unspecified type diabetes mellitus without mention of complication, uncontrolled 02/18/2013  . BP (high blood pressure) 06/24/2013  . Disorder of peripheral nervous system ([place]) 06/24/2013  . 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 Diabetes mellitus, type 2 ([place]) 06/24/2013  . Tachycardia 08/07/2014  . Septic joint of left shoulder region ([place]) 08/07/2014  . Sinus tachycardia ([place]) 08/07/2014  . Staphylococcus aureus bacteremia   . Septic joint of right knee joint ([place])   . ESRD (end stage renal disease) on dialysis ([place])   . Septic joint of right hand ([place])   . SIRS (systemic inflammatory response syndrome) ([name])   . Benign  essential HTN   . Hepatitis C 08/16/2014  . Acute renal failure ([place])   . Acute renal failure syndrome ([place])   . Hep C w/ coma, chronic ([place])   . Joint infection ([place])   . Physical debility 09/14/2014   Resolved Ambulatory Problems    Diagnosis Date Noted  . No Resolved Ambulatory Problems   Past Medical History  Diagnosis Date  . Hypertension   . Peripheral neuropathy ([place])   . Diabetes mellitus   . Depression   . Anxiety   . Chronic kidney disease   . GERD (gastroesophageal reflux disease)   . Septic arthritis ([place])   . Dysrhythmia   . Hemodialysis patient [facility])    Social History  Substance Use Topics  . Smoking status: Current Every Day Smoker -- 0.50 packs/day for 20 years  . Smokeless tobacco: Never Used     Comment: Smoking 7-8 cigs per day  . Alcohol Use: No     Comment: per pt no alcohol since 09/2014     Review of Systems 10 point ros is negative except for lower extremity swelling to both legs, left knee pain with ambulation. Otherwise, 10 point ros is negative    Objective:   Physical Exam BP 148/68 mmHg  Pulse 72  Temp(Src) 98.7 F (37.1 C) (Oral)  LMP 03/16/2002 Physical Exam  Constitutional:  oriented to person, place, and time. appears well-developed and well-nourished. No distress.  HENT: /AT, PERRLA, no scleral icterus Mouth/Throat: Oropharynx is clear and moist. No oropharyngeal exudate.  Cardiovascular: Normal rate, regular rhythm and normal heart sounds. Exam reveals no gallop and no friction rub.  No murmur heard.  Pulmonary/Chest: Effort normal and breath sounds normal. No respiratory distress.  has no wheezes.  Neck = supple, no nuchal rigidity Abdominal: Soft. Bowel sounds are normal.  exhibits no distension. There is no tenderness.  Lymphadenopathy: no cervical adenopathy. No axillary adenopathy Neurological: alert and oriented to person, place, and time.  Skin: Skin is warm and dry. No rash noted. No erythema. Ext: +2 edema to legs  bilaterally up thigh it is trace. Left knee warm with possible effusion  Psychiatric: a normal mood and affect.  behavior is normal.        Assessment & Plan:  Leg swelling = she looks volume overloaded and wonder if she needs further diuresis due to ckd. She sees dr. [name] next week and will ask patient to mention overall edema. Will reach out to Dr. [name]  Knee pain = this could be reactive effusion as it has been in the past. Will arrange to see if can get back into seeing Dr. [name] for evaluation, arthrocentesis.

## 2015-07-02 DIAGNOSIS — D631 Anemia in chronic kidney disease: Secondary | ICD-10-CM | POA: Diagnosis not present

## 2015-07-02 DIAGNOSIS — B1920 Unspecified viral hepatitis C without hepatic coma: Secondary | ICD-10-CM | POA: Diagnosis not present

## 2015-07-02 DIAGNOSIS — M25462 Effusion, left knee: Secondary | ICD-10-CM | POA: Diagnosis not present

## 2015-07-02 DIAGNOSIS — N184 Chronic kidney disease, stage 4 (severe): Secondary | ICD-10-CM | POA: Diagnosis not present

## 2015-07-02 DIAGNOSIS — G629 Polyneuropathy, unspecified: Secondary | ICD-10-CM | POA: Diagnosis not present

## 2015-07-02 DIAGNOSIS — E1129 Type 2 diabetes mellitus with other diabetic kidney complication: Secondary | ICD-10-CM | POA: Diagnosis not present

## 2015-07-02 DIAGNOSIS — I129 Hypertensive chronic kidney disease with stage 1 through stage 4 chronic kidney disease, or unspecified chronic kidney disease: Secondary | ICD-10-CM | POA: Diagnosis not present

## 2015-07-02 DIAGNOSIS — N2581 Secondary hyperparathyroidism of renal origin: Secondary | ICD-10-CM | POA: Diagnosis not present

## 2015-07-10 DIAGNOSIS — N184 Chronic kidney disease, stage 4 (severe): Secondary | ICD-10-CM | POA: Diagnosis not present

## 2015-07-10 DIAGNOSIS — I129 Hypertensive chronic kidney disease with stage 1 through stage 4 chronic kidney disease, or unspecified chronic kidney disease: Secondary | ICD-10-CM | POA: Diagnosis not present

## 2015-07-10 DIAGNOSIS — G629 Polyneuropathy, unspecified: Secondary | ICD-10-CM | POA: Diagnosis not present

## 2015-07-10 DIAGNOSIS — N2581 Secondary hyperparathyroidism of renal origin: Secondary | ICD-10-CM | POA: Diagnosis not present

## 2015-07-10 DIAGNOSIS — E1129 Type 2 diabetes mellitus with other diabetic kidney complication: Secondary | ICD-10-CM | POA: Diagnosis not present

## 2015-07-10 DIAGNOSIS — M25462 Effusion, left knee: Secondary | ICD-10-CM | POA: Diagnosis not present

## 2015-07-10 DIAGNOSIS — B1920 Unspecified viral hepatitis C without hepatic coma: Secondary | ICD-10-CM | POA: Diagnosis not present

## 2015-07-10 DIAGNOSIS — D631 Anemia in chronic kidney disease: Secondary | ICD-10-CM | POA: Diagnosis not present

## 2015-07-13 ENCOUNTER — Other Ambulatory Visit (HOSPITAL_COMMUNITY): Payer: Self-pay | Admitting: *Deleted

## 2015-07-16 ENCOUNTER — Inpatient Hospital Stay (HOSPITAL_COMMUNITY): Admission: RE | Admit: 2015-07-16 | Payer: Medicare Other | Source: Ambulatory Visit

## 2015-07-16 DIAGNOSIS — I129 Hypertensive chronic kidney disease with stage 1 through stage 4 chronic kidney disease, or unspecified chronic kidney disease: Secondary | ICD-10-CM | POA: Diagnosis not present

## 2015-07-16 DIAGNOSIS — N2581 Secondary hyperparathyroidism of renal origin: Secondary | ICD-10-CM | POA: Diagnosis not present

## 2015-07-16 DIAGNOSIS — N184 Chronic kidney disease, stage 4 (severe): Secondary | ICD-10-CM | POA: Diagnosis not present

## 2015-07-16 DIAGNOSIS — D631 Anemia in chronic kidney disease: Secondary | ICD-10-CM | POA: Diagnosis not present

## 2015-07-16 DIAGNOSIS — M25462 Effusion, left knee: Secondary | ICD-10-CM | POA: Diagnosis not present

## 2015-07-16 DIAGNOSIS — E1129 Type 2 diabetes mellitus with other diabetic kidney complication: Secondary | ICD-10-CM | POA: Diagnosis not present

## 2015-07-23 ENCOUNTER — Telehealth: Payer: Self-pay

## 2015-07-23 ENCOUNTER — Telehealth: Payer: Self-pay | Admitting: Internal Medicine

## 2015-07-23 DIAGNOSIS — I129 Hypertensive chronic kidney disease with stage 1 through stage 4 chronic kidney disease, or unspecified chronic kidney disease: Secondary | ICD-10-CM | POA: Diagnosis not present

## 2015-07-23 DIAGNOSIS — E1129 Type 2 diabetes mellitus with other diabetic kidney complication: Secondary | ICD-10-CM | POA: Diagnosis not present

## 2015-07-23 DIAGNOSIS — M25462 Effusion, left knee: Secondary | ICD-10-CM | POA: Diagnosis not present

## 2015-07-23 DIAGNOSIS — Y92099 Unspecified place in other non-institutional residence as the place of occurrence of the external cause: Secondary | ICD-10-CM | POA: Diagnosis not present

## 2015-07-23 DIAGNOSIS — N184 Chronic kidney disease, stage 4 (severe): Secondary | ICD-10-CM | POA: Diagnosis not present

## 2015-07-23 DIAGNOSIS — W19XXXA Unspecified fall, initial encounter: Secondary | ICD-10-CM | POA: Diagnosis not present

## 2015-07-23 DIAGNOSIS — D631 Anemia in chronic kidney disease: Secondary | ICD-10-CM | POA: Diagnosis not present

## 2015-07-23 DIAGNOSIS — N2581 Secondary hyperparathyroidism of renal origin: Secondary | ICD-10-CM | POA: Diagnosis not present

## 2015-07-23 NOTE — Telephone Encounter (Signed)
Pt. Daughter called requesting a FL2 form for the pt. Because the pt. Goes to [facility] and they do not have that form because they are not a primary care provider. Pt. Daughter would like to know if the pt. PCP can fax over the form for them to fill out so the pt. Can have a in home aid.  Pt. Daughter number- [phone]

## 2015-07-23 NOTE — Telephone Encounter (Signed)
Returned call to [employer] not available Message left on voice mail to return our call

## 2015-07-26 ENCOUNTER — Other Ambulatory Visit (HOSPITAL_COMMUNITY): Payer: Self-pay | Admitting: *Deleted

## 2015-07-27 ENCOUNTER — Ambulatory Visit (HOSPITAL_COMMUNITY)
Admission: RE | Admit: 2015-07-27 | Discharge: 2015-07-27 | Disposition: A | Discharge status: Home / Self Care | Payer: Medicare Other | Source: Ambulatory Visit | Attending: Nephrology | Admitting: Nephrology

## 2015-07-27 DIAGNOSIS — Z5181 Encounter for therapeutic drug level monitoring: Secondary | ICD-10-CM | POA: Insufficient documentation

## 2015-07-27 DIAGNOSIS — Z79899 Other long term (current) drug therapy: Secondary | ICD-10-CM | POA: Insufficient documentation

## 2015-07-27 DIAGNOSIS — N184 Chronic kidney disease, stage 4 (severe): Secondary | ICD-10-CM | POA: Insufficient documentation

## 2015-07-27 DIAGNOSIS — D509 Iron deficiency anemia, unspecified: Secondary | ICD-10-CM | POA: Insufficient documentation

## 2015-07-27 DIAGNOSIS — D631 Anemia in chronic kidney disease: Secondary | ICD-10-CM | POA: Insufficient documentation

## 2015-07-27 MED ORDER — FERUMOXYTOL INJECTION 510 MG/17 ML
510.0000 mg | Freq: Once | INTRAVENOUS | Status: AC
  Administered 2015-07-27: 510 mg via INTRAVENOUS
  Filled 2015-07-27: qty 17

## 2015-07-27 MED ORDER — DARBEPOETIN ALFA 200 MCG/0.4ML IJ SOSY: 200.0000 ug | PREFILLED_SYRINGE | INTRAMUSCULAR | Status: DC

## 2015-07-27 MED ORDER — DARBEPOETIN ALFA 200 MCG/0.4ML IJ SOSY
PREFILLED_SYRINGE | INTRAMUSCULAR | Status: AC
  Administered 2015-07-27: 200 ug via SUBCUTANEOUS
  Filled 2015-07-27: qty 0.4

## 2015-07-27 NOTE — Discharge Instructions (Signed)
Darbepoetin Alfa injection What is this medicine? DARBEPOETIN ALFA (dar be POE e tin AL fa) helps your body make more red blood cells. It is used to treat anemia caused by chronic kidney failure and chemotherapy. This medicine may be used for other purposes; ask your health care provider or pharmacist if you have questions. What should I tell my health care provider before I take this medicine? They need to know if you have any of these conditions: -blood clotting disorders or history of blood clots -cancer patient not on chemotherapy -cystic fibrosis -heart disease, such as angina, heart failure, or a history of a heart attack -hemoglobin level of 12 g/dL or greater -high blood pressure -low levels of folate, iron, or vitamin B12 -seizures -an unusual or allergic reaction to darbepoetin, erythropoietin, albumin, hamster proteins, latex, other medicines, foods, dyes, or preservatives -pregnant or trying to get pregnant -breast-feeding How should I use this medicine? This medicine is for injection into a vein or under the skin. It is usually given by a health care professional in a hospital or clinic setting. If you get this medicine at home, you will be taught how to prepare and give this medicine. Do not shake the solution before you withdraw a dose. Use exactly as directed. Take your medicine at regular intervals. Do not take your medicine more often than directed. It is important that you put your used needles and syringes in a special sharps container. Do not put them in a trash can. If you do not have a sharps container, call your pharmacist or healthcare provider to get one. Talk to your pediatrician regarding the use of this medicine in children. While this medicine may be used in children as young as 1 year for selected conditions, precautions do apply. Overdosage: If you think you have taken too much of this medicine contact a poison control center or emergency room at once. NOTE:  This medicine is only for you. Do not share this medicine with others. What if I miss a dose? If you miss a dose, take it as soon as you can. If it is almost time for your next dose, take only that dose. Do not take double or extra doses. What may interact with this medicine? Do not take this medicine with any of the following medications: -epoetin alfa This list may not describe all possible interactions. Give your health care provider a list of all the medicines, herbs, non-prescription drugs, or dietary supplements you use. Also tell them if you smoke, drink alcohol, or use illegal drugs. Some items may interact with your medicine. What should I watch for while using this medicine? Visit your prescriber or health care professional for regular checks on your progress and for the needed blood tests and blood pressure measurements. It is especially important for the doctor to make sure your hemoglobin level is in the desired range, to limit the risk of potential side effects and to give you the best benefit. Keep all appointments for any recommended tests. Check your blood pressure as directed. Ask your doctor what your blood pressure should be and when you should contact him or her. As your body makes more red blood cells, you may need to take iron, folic acid, or vitamin B supplements. Ask your doctor or health care provider which products are right for you. If you have kidney disease continue dietary restrictions, even though this medication can make you feel better. Talk with your doctor or health care professional about the   foods you eat and the vitamins that you take. What side effects may I notice from receiving this medicine? Side effects that you should report to your doctor or health care professional as soon as possible: -allergic reactions like skin rash, itching or hives, swelling of the face, lips, or tongue -breathing problems -changes in vision -chest pain -confusion, trouble speaking  or understanding -feeling faint or lightheaded, falls -high blood pressure -muscle aches or pains -pain, swelling, warmth in the leg -rapid weight gain -severe headaches -sudden numbness or weakness of the face, arm or leg -trouble walking, dizziness, loss of balance or coordination -seizures (convulsions) -swelling of the ankles, feet, hands -unusually weak or tired Side effects that usually do not require medical attention (report to your doctor or health care professional if they continue or are bothersome): -diarrhea -fever, chills (flu-like symptoms) -headaches -nausea, vomiting -redness, stinging, or swelling at site where injected This list may not describe all possible side effects. Call your doctor for medical advice about side effects. You may report side effects to FDA at 1-800-FDA-1088. Where should I keep my medicine? Keep out of the reach of children. Store in a refrigerator between 2 and 8 degrees C (36 and 46 degrees F). Do not freeze. Do not shake. Throw away any unused portion if using a single-dose vial. Throw away any unused medicine after the expiration date. NOTE: This sheet is a summary. It may not cover all possible information. If you have questions about this medicine, talk to your doctor, pharmacist, or health care provider.    2016, Elsevier/Gold Standard. (2008-05-16 10:23:57)  

## 2015-07-30 LAB — POCT HEMOGLOBIN-HEMACUE: Hemoglobin: 8.2 g/dL — ABNORMAL LOW (ref 12.0–15.0)

## 2015-08-02 ENCOUNTER — Ambulatory Visit: Payer: Medicare Other | Admitting: Internal Medicine

## 2015-08-06 DIAGNOSIS — I129 Hypertensive chronic kidney disease with stage 1 through stage 4 chronic kidney disease, or unspecified chronic kidney disease: Secondary | ICD-10-CM | POA: Diagnosis not present

## 2015-08-06 DIAGNOSIS — M25462 Effusion, left knee: Secondary | ICD-10-CM | POA: Diagnosis not present

## 2015-08-06 DIAGNOSIS — E1129 Type 2 diabetes mellitus with other diabetic kidney complication: Secondary | ICD-10-CM | POA: Diagnosis not present

## 2015-08-06 DIAGNOSIS — N2581 Secondary hyperparathyroidism of renal origin: Secondary | ICD-10-CM | POA: Diagnosis not present

## 2015-08-06 DIAGNOSIS — D631 Anemia in chronic kidney disease: Secondary | ICD-10-CM | POA: Diagnosis not present

## 2015-08-06 DIAGNOSIS — N184 Chronic kidney disease, stage 4 (severe): Secondary | ICD-10-CM | POA: Diagnosis not present

## 2015-08-14 DIAGNOSIS — E119 Type 2 diabetes mellitus without complications: Secondary | ICD-10-CM | POA: Diagnosis not present

## 2015-08-14 DIAGNOSIS — D649 Anemia, unspecified: Secondary | ICD-10-CM | POA: Diagnosis not present

## 2015-08-14 DIAGNOSIS — M25562 Pain in left knee: Secondary | ICD-10-CM | POA: Diagnosis not present

## 2015-08-23 ENCOUNTER — Other Ambulatory Visit (HOSPITAL_COMMUNITY): Payer: Self-pay

## 2015-08-23 DIAGNOSIS — I129 Hypertensive chronic kidney disease with stage 1 through stage 4 chronic kidney disease, or unspecified chronic kidney disease: Secondary | ICD-10-CM | POA: Diagnosis not present

## 2015-08-23 DIAGNOSIS — D631 Anemia in chronic kidney disease: Secondary | ICD-10-CM | POA: Diagnosis not present

## 2015-08-23 DIAGNOSIS — N184 Chronic kidney disease, stage 4 (severe): Secondary | ICD-10-CM | POA: Diagnosis not present

## 2015-08-23 DIAGNOSIS — M25462 Effusion, left knee: Secondary | ICD-10-CM | POA: Diagnosis not present

## 2015-08-23 DIAGNOSIS — N2581 Secondary hyperparathyroidism of renal origin: Secondary | ICD-10-CM | POA: Diagnosis not present

## 2015-08-23 DIAGNOSIS — E1129 Type 2 diabetes mellitus with other diabetic kidney complication: Secondary | ICD-10-CM | POA: Diagnosis not present

## 2015-08-24 ENCOUNTER — Ambulatory Visit (HOSPITAL_COMMUNITY)
Admission: RE | Admit: 2015-08-24 | Discharge: 2015-08-24 | Disposition: A | Discharge status: Home / Self Care | Payer: Medicare Other | Source: Ambulatory Visit | Attending: Nephrology | Admitting: Nephrology

## 2015-08-24 DIAGNOSIS — D509 Iron deficiency anemia, unspecified: Secondary | ICD-10-CM | POA: Diagnosis not present

## 2015-08-24 DIAGNOSIS — Z79899 Other long term (current) drug therapy: Secondary | ICD-10-CM | POA: Diagnosis not present

## 2015-08-24 DIAGNOSIS — Z5181 Encounter for therapeutic drug level monitoring: Secondary | ICD-10-CM | POA: Diagnosis not present

## 2015-08-24 DIAGNOSIS — D631 Anemia in chronic kidney disease: Secondary | ICD-10-CM | POA: Insufficient documentation

## 2015-08-24 DIAGNOSIS — N184 Chronic kidney disease, stage 4 (severe): Secondary | ICD-10-CM | POA: Diagnosis not present

## 2015-08-24 LAB — IRON AND TIBC
Iron: 82 ug/dL (ref 28–170)
Saturation Ratios: 25 % (ref 10.4–31.8)
TIBC: 335 ug/dL (ref 250–450)
UIBC: 253 ug/dL

## 2015-08-24 LAB — POCT HEMOGLOBIN-HEMACUE: Hemoglobin: 10.4 g/dL — ABNORMAL LOW (ref 12.0–15.0)

## 2015-08-24 LAB — FERRITIN: Ferritin: 764 ng/mL — ABNORMAL HIGH (ref 11–307)

## 2015-08-24 MED ORDER — SODIUM CHLORIDE 0.9 % IV SOLN
510.0000 mg | Freq: Once | INTRAVENOUS | Status: AC
  Administered 2015-08-24: 510 mg via INTRAVENOUS
  Filled 2015-08-24: qty 17

## 2015-08-24 MED ORDER — DARBEPOETIN ALFA 200 MCG/0.4ML IJ SOSY
200.0000 ug | PREFILLED_SYRINGE | INTRAMUSCULAR | Status: DC
  Administered 2015-08-24: 200 ug via SUBCUTANEOUS

## 2015-08-24 MED ORDER — DARBEPOETIN ALFA 200 MCG/0.4ML IJ SOSY
PREFILLED_SYRINGE | INTRAMUSCULAR | Status: DC
Start: 2015-08-24 — End: 2015-08-25
  Filled 2015-08-24: qty 0.4

## 2015-09-03 ENCOUNTER — Ambulatory Visit: Payer: Medicare Other | Admitting: Internal Medicine

## 2015-09-04 DIAGNOSIS — Z720 Tobacco use: Secondary | ICD-10-CM | POA: Diagnosis not present

## 2015-09-04 DIAGNOSIS — I129 Hypertensive chronic kidney disease with stage 1 through stage 4 chronic kidney disease, or unspecified chronic kidney disease: Secondary | ICD-10-CM | POA: Diagnosis not present

## 2015-09-04 DIAGNOSIS — E1129 Type 2 diabetes mellitus with other diabetic kidney complication: Secondary | ICD-10-CM | POA: Diagnosis not present

## 2015-09-04 DIAGNOSIS — M25462 Effusion, left knee: Secondary | ICD-10-CM | POA: Diagnosis not present

## 2015-09-04 DIAGNOSIS — D631 Anemia in chronic kidney disease: Secondary | ICD-10-CM | POA: Diagnosis not present

## 2015-09-04 DIAGNOSIS — K219 Gastro-esophageal reflux disease without esophagitis: Secondary | ICD-10-CM | POA: Diagnosis not present

## 2015-09-04 DIAGNOSIS — N2581 Secondary hyperparathyroidism of renal origin: Secondary | ICD-10-CM | POA: Diagnosis not present

## 2015-09-04 DIAGNOSIS — F329 Major depressive disorder, single episode, unspecified: Secondary | ICD-10-CM | POA: Diagnosis not present

## 2015-09-04 DIAGNOSIS — N184 Chronic kidney disease, stage 4 (severe): Secondary | ICD-10-CM | POA: Diagnosis not present

## 2015-09-10 ENCOUNTER — Ambulatory Visit (INDEPENDENT_AMBULATORY_CARE_PROVIDER_SITE_OTHER): Payer: Medicare Other | Admitting: Internal Medicine

## 2015-09-10 ENCOUNTER — Encounter: Payer: Self-pay | Admitting: Internal Medicine

## 2015-09-10 VITALS — BP 167/77 | HR 80 | Temp 98.3°F

## 2015-09-10 DIAGNOSIS — M1712 Unilateral primary osteoarthritis, left knee: Secondary | ICD-10-CM | POA: Diagnosis not present

## 2015-09-10 NOTE — Progress Notes (Signed)
RFV: follow up for septic arthritis sequelae Subjective:    [name], female    DOB: 01-26-1956, 60 y.o.   MRN: [id number]  HPI Brandy Houston is a 60yo F who had History of SAB with septic arhtritis over 1 year ago. She has had worsening acute on chronic kidney disease where she required increase diuretics. Most recently  She reports having worsening left knee destabilization, requiring eval for TKA. She no longer ambulates due to instability and risk of fall. She comes to clinic for possible clearance for knee surgery. She has seen Dr [name] who attempted arthrocentesis without success  Previously in 2016, we treated her chronic hepatitis c without hepatic coma. And remains to be undetectable.  Allergies  Allergen Reactions  . Compazine [Prochlorperazine] Shortness Of Breath and Swelling    TONGUE SWELLS  . Omnipaque [Iohexol] Hives  . Shellfish-Derived Products Anaphylaxis  . Iodinated Diagnostic Agents Rash   Current Outpatient Prescriptions on File Prior to Visit  Medication Sig Dispense Refill  . acetaminophen (TYLENOL) 500 MG tablet Take 1,000 mg by mouth every 6 (six) hours as needed for mild pain.    [name] amLODipine (NORVASC) 10 MG tablet Take 10 mg by mouth daily.    [name] aspirin 81 MG chewable tablet Chew 81 mg by mouth daily.     . Blood Glucose Monitoring Suppl (ACCU-CHEK AVIVA PLUS) W/DEVICE KIT Check sugars TID for E11.65 1 kit 0  . calcium carbonate (TUMS E-X 750) 750 MG chewable tablet Chew 1 tablet (750 mg total) by mouth daily. With your largest meal (Patient taking differently: Chew 1 tablet by mouth 3 (three) times daily before meals. )    . diphenhydrAMINE (BENADRYL) 25 mg capsule Take 1 capsule (25 mg total) by mouth every 6 (six) hours as needed for itching. 30 capsule 0  . furosemide (LASIX) 80 MG tablet Take 80-160 mg by mouth 2 (two) times daily. Take 134m in the morning and 847min the evening    . gabapentin (NEURONTIN) 100 MG capsule Take 1 capsule (100 mg  total) by mouth 2 (two) times daily. 60 capsule 2  . glucose blood (ACCU-CHEK AVIVA) test strip Check sugars TID for E11.65 100 each 12  . hydrALAZINE (APRESOLINE) 25 MG tablet Take 1 tablet (25 mg total) by mouth 3 (three) times daily. (Patient taking differently: Take 50 mg by mouth 3 (three) times daily. ) 90 tablet 2  . HYDROcodone-acetaminophen (NORCO) 7.5-325 MG tablet Take 1-2 tablets by mouth every 6 (six) hours as needed for moderate pain. 40 tablet 0  . Insulin Glargine (LANTUS SOLOSTAR) 100 UNIT/ML Solostar Pen Inject 5 Units into the skin daily at 10 pm. (Patient taking differently: Inject 10 Units into the skin daily at 10 pm. ) 5 pen 3  . Insulin Syringe-Needle U-100 (BD INSULIN SYRINGE ULTRAFINE) 31G X 5/16" 0.5 ML MISC Inject insulin once per night for E11.65 100 each 10  . Lancet Devices (ACCU-CHEK SOFTCLIX) lancets Check sugars TID for E11.65 1 each 10  . metoprolol (LOPRESSOR) 50 MG tablet Take 50 mg by mouth at bedtime.     . Multiple Vitamin (MULTIVITAMIN WITH MINERALS) TABS tablet Take 1 tablet by mouth daily.    . multivitamin (RENA-VIT) TABS tablet Take 1 tablet by mouth at bedtime. 30 tablet 1  . oxyCODONE (OXY IR/ROXICODONE) 5 MG immediate release tablet Take 1 tablet (5 mg total) by mouth every 6 (six) hours as needed for severe pain. 60 tablet 0  . sertraline (  ZOLOFT) 100 MG tablet Take 1 tablet (100 mg total) by mouth daily. 30 tablet 6  . traMADol (ULTRAM) 50 MG tablet Take 50 mg by mouth every 6 (six) hours as needed for moderate pain.      No current facility-administered medications on file prior to visit.   Active Ambulatory Problems    Diagnosis Date Noted  . Chest pain at rest 10/18/2011  . Shortness of breath 10/18/2011  . Uncontrolled hypertension 02/18/2013  . Type II or unspecified type diabetes mellitus without mention of complication, uncontrolled 02/18/2013  . BP (high blood pressure) 06/24/2013  . Disorder of peripheral nervous system ([place])  06/24/2013  . 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 Diabetes mellitus, type 2 ([place]) 06/24/2013  . Tachycardia 08/07/2014  . Septic joint of left shoulder region ([place]) 08/07/2014  . Sinus tachycardia ([place]) 08/07/2014  . Staphylococcus aureus bacteremia   . Septic joint of right knee joint ([place])   . ESRD (end stage renal disease) on dialysis ([place])   . Septic joint of right hand ([place])   . SIRS (systemic inflammatory response syndrome) ([name])   . Benign essential HTN   . Hepatitis C 08/16/2014  . Acute renal failure ([place])   . Acute renal failure syndrome ([place])   . Hep C w/ coma, chronic ([place])   . Joint infection ([place])   . Physical debility 09/14/2014   Resolved Ambulatory Problems    Diagnosis Date Noted  . No Resolved Ambulatory Problems   Past Medical History  Diagnosis Date  . Hypertension   . Peripheral neuropathy ([place])   . Diabetes mellitus   . Depression   . Anxiety   . Chronic kidney disease   . GERD (gastroesophageal reflux disease)   . Septic arthritis ([place])   . Dysrhythmia   . Hemodialysis patient [facility])       Review of Systems + left knee pain, occ twisting of her knee when she is weightbearing. occ swelling of lower extremities bilaterally. No fever, chills, nightsweats.    Objective:   Physical Exam BP 167/77 mmHg  Pulse 80  Temp(Src) 98.3 F (36.8 C) (Oral)  LMP 03/16/2002 Physical Exam  Constitutional:  oriented to person, place, and time. appears well-developed and well-nourished. No distress.  HENT: /AT, PERRLA, no scleral icterus Mouth/Throat: Oropharynx is clear and moist. No oropharyngeal exudate.  Ext: left knee swollen  Skin: Skin is warm and dry. No rash noted. No erythema.  Psychiatric: a normal mood and affect.  behavior is normal.       Assessment & Plan:  Candidate for TKA = will Reach out to Dr. [name] to possibly move up her surgery. The patient had extended course of treatment for staph aureus infection. Recommend that she  undergo decolonization with mupirocin and chg bathing due to her prior hx of staph aureus infection. Would screen for MRSA to determine if need to give pre op vanco in addition to cefazolin. Difficult to tell if smoldering infection still is part of her process. She has finished her treatment many months ago. Would send tissue to path perioperative as well as culture to see if need to again for septic arthritis.

## 2015-09-21 ENCOUNTER — Ambulatory Visit (HOSPITAL_COMMUNITY)
Admission: RE | Admit: 2015-09-21 | Discharge: 2015-09-21 | Disposition: A | Discharge status: Home / Self Care | Payer: Medicare Other | Source: Ambulatory Visit | Attending: Nephrology | Admitting: Nephrology

## 2015-09-21 DIAGNOSIS — Z5181 Encounter for therapeutic drug level monitoring: Secondary | ICD-10-CM | POA: Diagnosis not present

## 2015-09-21 DIAGNOSIS — Z79899 Other long term (current) drug therapy: Secondary | ICD-10-CM | POA: Insufficient documentation

## 2015-09-21 DIAGNOSIS — D631 Anemia in chronic kidney disease: Secondary | ICD-10-CM | POA: Diagnosis not present

## 2015-09-21 DIAGNOSIS — N184 Chronic kidney disease, stage 4 (severe): Secondary | ICD-10-CM | POA: Diagnosis not present

## 2015-09-21 LAB — IRON AND TIBC
Iron: 59 ug/dL (ref 28–170)
Saturation Ratios: 19 % (ref 10.4–31.8)
TIBC: 307 ug/dL (ref 250–450)
UIBC: 248 ug/dL

## 2015-09-21 LAB — FERRITIN: Ferritin: 651 ng/mL — ABNORMAL HIGH (ref 11–307)

## 2015-09-21 MED ORDER — DARBEPOETIN ALFA 200 MCG/0.4ML IJ SOSY
200.0000 ug | PREFILLED_SYRINGE | INTRAMUSCULAR | Status: DC
  Administered 2015-09-21: 200 ug via SUBCUTANEOUS

## 2015-09-21 MED ORDER — DARBEPOETIN ALFA 200 MCG/0.4ML IJ SOSY
PREFILLED_SYRINGE | INTRAMUSCULAR | Status: AC
  Administered 2015-09-21: 200 ug via SUBCUTANEOUS
  Filled 2015-09-21: qty 0.4

## 2015-09-24 LAB — POCT HEMOGLOBIN-HEMACUE: Hemoglobin: 10.9 g/dL — ABNORMAL LOW (ref 12.0–15.0)

## 2015-10-01 DIAGNOSIS — N184 Chronic kidney disease, stage 4 (severe): Secondary | ICD-10-CM | POA: Diagnosis not present

## 2015-10-12 DIAGNOSIS — M00262 Other streptococcal arthritis, left knee: Secondary | ICD-10-CM | POA: Diagnosis not present

## 2015-10-12 DIAGNOSIS — R601 Generalized edema: Secondary | ICD-10-CM | POA: Diagnosis not present

## 2015-10-12 DIAGNOSIS — M23307 Other meniscus derangements, unspecified meniscus, left knee: Secondary | ICD-10-CM | POA: Diagnosis not present

## 2015-10-12 DIAGNOSIS — M25562 Pain in left knee: Secondary | ICD-10-CM | POA: Diagnosis not present

## 2015-10-18 ENCOUNTER — Other Ambulatory Visit (HOSPITAL_COMMUNITY): Payer: Self-pay | Admitting: *Deleted

## 2015-10-19 ENCOUNTER — Ambulatory Visit (HOSPITAL_COMMUNITY)
Admission: RE | Admit: 2015-10-19 | Discharge: 2015-10-19 | Disposition: A | Discharge status: Home / Self Care | Payer: Medicare Other | Source: Ambulatory Visit | Attending: Nephrology | Admitting: Nephrology

## 2015-10-19 DIAGNOSIS — N184 Chronic kidney disease, stage 4 (severe): Secondary | ICD-10-CM | POA: Insufficient documentation

## 2015-10-19 DIAGNOSIS — Z79899 Other long term (current) drug therapy: Secondary | ICD-10-CM | POA: Insufficient documentation

## 2015-10-19 DIAGNOSIS — Z5181 Encounter for therapeutic drug level monitoring: Secondary | ICD-10-CM | POA: Insufficient documentation

## 2015-10-19 DIAGNOSIS — D631 Anemia in chronic kidney disease: Secondary | ICD-10-CM | POA: Diagnosis not present

## 2015-10-19 DIAGNOSIS — D509 Iron deficiency anemia, unspecified: Secondary | ICD-10-CM | POA: Diagnosis not present

## 2015-10-19 LAB — POCT HEMOGLOBIN-HEMACUE: Hemoglobin: 12 g/dL (ref 12.0–15.0)

## 2015-10-19 LAB — IRON AND TIBC
Iron: 76 ug/dL (ref 28–170)
Saturation Ratios: 28 % (ref 10.4–31.8)
TIBC: 270 ug/dL (ref 250–450)
UIBC: 194 ug/dL

## 2015-10-19 LAB — FERRITIN: Ferritin: 348 ng/mL — ABNORMAL HIGH (ref 11–307)

## 2015-10-19 MED ORDER — SODIUM CHLORIDE 0.9 % IV SOLN
510.0000 mg | Freq: Once | INTRAVENOUS | Status: AC
  Administered 2015-10-19: 510 mg via INTRAVENOUS
  Filled 2015-10-19: qty 17

## 2015-10-19 MED ORDER — DARBEPOETIN ALFA 200 MCG/0.4ML IJ SOSY: 200.0000 ug | PREFILLED_SYRINGE | INTRAMUSCULAR | Status: DC

## 2015-10-24 ENCOUNTER — Other Ambulatory Visit: Payer: Self-pay | Admitting: Orthopaedic Surgery

## 2015-10-25 ENCOUNTER — Ambulatory Visit (HOSPITAL_COMMUNITY)
Admission: RE | Admit: 2015-10-25 | Discharge: 2015-10-25 | Disposition: A | Discharge status: Home / Self Care | Payer: Medicare Other | Source: Ambulatory Visit | Attending: Nephrology | Admitting: Nephrology

## 2015-10-25 NOTE — Progress Notes (Signed)
Patient was scheduled too soon. Rescheduled appropriately. No treatment today.

## 2015-10-29 ENCOUNTER — Other Ambulatory Visit (HOSPITAL_COMMUNITY): Payer: Self-pay | Admitting: *Deleted

## 2015-10-29 ENCOUNTER — Encounter (HOSPITAL_COMMUNITY): Payer: Self-pay

## 2015-10-29 ENCOUNTER — Encounter (HOSPITAL_COMMUNITY)
Admission: RE | Admit: 2015-10-29 | Discharge: 2015-10-29 | Disposition: A | Discharge status: Home / Self Care | Payer: Medicare Other | Source: Ambulatory Visit | Attending: Orthopaedic Surgery | Admitting: Orthopaedic Surgery

## 2015-10-29 DIAGNOSIS — Z7982 Long term (current) use of aspirin: Secondary | ICD-10-CM | POA: Diagnosis not present

## 2015-10-29 DIAGNOSIS — K219 Gastro-esophageal reflux disease without esophagitis: Secondary | ICD-10-CM | POA: Diagnosis not present

## 2015-10-29 DIAGNOSIS — N184 Chronic kidney disease, stage 4 (severe): Secondary | ICD-10-CM | POA: Diagnosis not present

## 2015-10-29 DIAGNOSIS — I129 Hypertensive chronic kidney disease with stage 1 through stage 4 chronic kidney disease, or unspecified chronic kidney disease: Secondary | ICD-10-CM | POA: Diagnosis not present

## 2015-10-29 DIAGNOSIS — Z794 Long term (current) use of insulin: Secondary | ICD-10-CM | POA: Diagnosis not present

## 2015-10-29 DIAGNOSIS — E1122 Type 2 diabetes mellitus with diabetic chronic kidney disease: Secondary | ICD-10-CM | POA: Diagnosis not present

## 2015-10-29 DIAGNOSIS — M1712 Unilateral primary osteoarthritis, left knee: Secondary | ICD-10-CM | POA: Diagnosis not present

## 2015-10-29 DIAGNOSIS — Z01818 Encounter for other preprocedural examination: Secondary | ICD-10-CM | POA: Diagnosis not present

## 2015-10-29 DIAGNOSIS — Z0183 Encounter for blood typing: Secondary | ICD-10-CM | POA: Diagnosis not present

## 2015-10-29 DIAGNOSIS — Z5181 Encounter for therapeutic drug level monitoring: Secondary | ICD-10-CM | POA: Diagnosis not present

## 2015-10-29 DIAGNOSIS — R9431 Abnormal electrocardiogram [ECG] [EKG]: Secondary | ICD-10-CM | POA: Diagnosis not present

## 2015-10-29 DIAGNOSIS — F17200 Nicotine dependence, unspecified, uncomplicated: Secondary | ICD-10-CM | POA: Diagnosis not present

## 2015-10-29 DIAGNOSIS — D631 Anemia in chronic kidney disease: Secondary | ICD-10-CM | POA: Diagnosis not present

## 2015-10-29 DIAGNOSIS — Z79899 Other long term (current) drug therapy: Secondary | ICD-10-CM | POA: Diagnosis not present

## 2015-10-29 DIAGNOSIS — Z01812 Encounter for preprocedural laboratory examination: Secondary | ICD-10-CM | POA: Diagnosis not present

## 2015-10-29 HISTORY — DX: Anemia, unspecified: D64.9

## 2015-10-29 LAB — COMPREHENSIVE METABOLIC PANEL
ALT: 63 U/L — ABNORMAL HIGH (ref 14–54)
AST: 30 U/L (ref 15–41)
Albumin: 3.1 g/dL — ABNORMAL LOW (ref 3.5–5.0)
Alkaline Phosphatase: 191 U/L — ABNORMAL HIGH (ref 38–126)
Anion gap: 10 (ref 5–15)
BUN: 48 mg/dL — ABNORMAL HIGH (ref 6–20)
CO2: 27 mmol/L (ref 22–32)
Calcium: 9.2 mg/dL (ref 8.9–10.3)
Chloride: 102 mmol/L (ref 101–111)
Creatinine, Ser: 2.12 mg/dL — ABNORMAL HIGH (ref 0.44–1.00)
GFR calc Af Amer: 28 mL/min — ABNORMAL LOW (ref >60)
GFR calc non Af Amer: 24 mL/min — ABNORMAL LOW (ref >60)
Glucose, Bld: 140 mg/dL — ABNORMAL HIGH (ref 65–99)
Potassium: 4.3 mmol/L (ref 3.5–5.1)
Sodium: 139 mmol/L (ref 135–145)
Total Bilirubin: 0.5 mg/dL (ref 0.3–1.2)
Total Protein: 7.6 g/dL (ref 6.5–8.1)

## 2015-10-29 LAB — CBC WITH DIFFERENTIAL/PLATELET
Basophils Absolute: 0 10*3/uL (ref 0.0–0.1)
Basophils Relative: 0 %
Eosinophils Absolute: 0.1 10*3/uL (ref 0.0–0.7)
Eosinophils Relative: 1 %
HCT: 29.2 % — ABNORMAL LOW (ref 36.0–46.0)
Hemoglobin: 9.5 g/dL — ABNORMAL LOW (ref 12.0–15.0)
Lymphocytes Relative: 21 %
Lymphs Abs: 1.2 10*3/uL (ref 0.7–4.0)
MCH: 29.8 pg (ref 26.0–34.0)
MCHC: 32.5 g/dL (ref 30.0–36.0)
MCV: 91.5 fL (ref 78.0–100.0)
Monocytes Absolute: 0.6 10*3/uL (ref 0.1–1.0)
Monocytes Relative: 11 %
Neutro Abs: 3.8 10*3/uL (ref 1.7–7.7)
Neutrophils Relative %: 67 %
Platelets: 275 10*3/uL (ref 150–400)
RBC: 3.19 MIL/uL — ABNORMAL LOW (ref 3.87–5.11)
RDW: 16.5 % — ABNORMAL HIGH (ref 11.5–15.5)
WBC: 5.7 10*3/uL (ref 4.0–10.5)

## 2015-10-29 LAB — URINALYSIS, ROUTINE W REFLEX MICROSCOPIC
Bilirubin Urine: NEGATIVE
Glucose, UA: NEGATIVE mg/dL
Hgb urine dipstick: NEGATIVE
Ketones, ur: NEGATIVE mg/dL
Nitrite: NEGATIVE
Protein, ur: 100 mg/dL — AB
Specific Gravity, Urine: 1.017 (ref 1.005–1.030)
pH: 5.5 (ref 5.0–8.0)

## 2015-10-29 LAB — GLUCOSE, CAPILLARY: Glucose-Capillary: 136 mg/dL — ABNORMAL HIGH (ref 65–99)

## 2015-10-29 LAB — SURGICAL PCR SCREEN
MRSA, PCR: NEGATIVE
Staphylococcus aureus: POSITIVE — AB

## 2015-10-29 LAB — URINE MICROSCOPIC-ADD ON

## 2015-10-29 LAB — PROTIME-INR
INR: 1.07 (ref 0.00–1.49)
Prothrombin Time: 14.1 seconds (ref 11.6–15.2)

## 2015-10-29 LAB — SEDIMENTATION RATE: Sed Rate: 102 mm/hr — ABNORMAL HIGH (ref 0–22)

## 2015-10-29 LAB — C-REACTIVE PROTEIN: CRP: 1.4 mg/dL — ABNORMAL HIGH (ref <1.0)

## 2015-10-29 LAB — APTT: aPTT: 36 seconds (ref 24–37)

## 2015-10-29 NOTE — Progress Notes (Signed)
I called a prescription for Mupirocin ointment to [facility], [place], [place], [place].

## 2015-10-29 NOTE — Pre-Procedure Instructions (Signed)
Brandy Houston  10/29/2015      [facility] [place], [place] - [address] [place] [address] Phone: [phone] Fax: [phone]    Your procedure is scheduled on -11-07-2015   Wednesday   Report to [facility] Admitting at 10:30 A.M.   Call this number if you have problems the morning of surgery:  [phone]   Remember:  Do not eat food or drink liquids after midnight.   Take these medicines the morning of surgery with A SIP OF WATER Tylenol if needed for pain,Benadryl if needed for itching,gabapentin(Neurotin),hydralazine(apresoline),Metoprolol(Toprol XL),omeprezole(prilosec),Sertraline(zolft),tramadol(Ultram) if needed                            How to Manage Your Diabetes Before and After Surgery  Why is it important to control my blood sugar before and after surgery? . Improving blood sugar levels before and after surgery helps healing and can limit problems. . A way of improving blood sugar control is eating a healthy diet by: o  Eating less sugar and carbohydrates o  Increasing activity/exercise o  Talking with your doctor about reaching your blood sugar goals . High blood sugars (greater than 180 mg/dL) can raise your risk of infections and slow your recovery, so you will need to focus on controlling your diabetes during the weeks before surgery. . Make sure that the doctor who takes care of your diabetes knows about your planned surgery including the date and location.  How do I manage my blood sugar before surgery? . Check your blood sugar at least 4 times a day, starting 2 days before surgery, to make sure that the level is not too high or low. o Check your blood sugar the morning of your surgery when you wake up and every 2 hours until you get to the Short Stay unit. . If your blood sugar is less than 70 mg/dL, you will need to treat for low blood sugar: o Do not take insulin. o Treat a low  blood sugar (less than 70 mg/dL) with  cup of clear juice (cranberry or apple), 4 glucose tablets, OR glucose gel. o Recheck blood sugar in 15 minutes after treatment (to make sure it is greater than 70 mg/dL). If your blood sugar is not greater than 70 mg/dL on recheck, call [phone] for further instructions. . Report your blood sugar to the short stay nurse when you get to Short Stay.  . If you are admitted to the hospital after surgery: o Your blood sugar will be checked by the staff and you will probably be given insulin after surgery (instead of oral diabetes medicines) to make sure you have good blood sugar levels. o The goal for blood sugar control after surgery is 80-180 mg/dL.              WHAT DO I DO ABOUT MY DIABETES MEDICATION?   [name] Do not take oral diabetes medicines (pills) the morning of surgery.  . THE NIGHT BEFORE SURGERY, take _7  units of _Lantus insulin.        Reviewed and Endorsed by [facility] Patient Education Committee, August 2015   Do not wear jewelry, make-up or nail polish.  Do not wear lotions, powders, or perfumes.  You may NOT wear deodorant.  Do not shave 48 hours prior to surgery.     Do not bring valuables to the hospital.  [facility] is not responsible for any belongings or valuables.  Contacts, dentures or bridgework may not be worn into surgery.  Leave your suitcase in the car.  After surgery it may be brought to your room.  For patients admitted to the hospital, discharge time will be determined by your treatment team.  Patients discharged the day of surgery will not be allowed to drive home.    Special instructions:  See attached Sheet for instructions on CHG showers  Please read over the following fact sheets that you were given. Coughing and Deep Breathing and Blood Transfusion Information

## 2015-10-30 ENCOUNTER — Encounter (HOSPITAL_COMMUNITY): Payer: Self-pay

## 2015-10-30 DIAGNOSIS — I129 Hypertensive chronic kidney disease with stage 1 through stage 4 chronic kidney disease, or unspecified chronic kidney disease: Secondary | ICD-10-CM | POA: Diagnosis not present

## 2015-10-30 DIAGNOSIS — D631 Anemia in chronic kidney disease: Secondary | ICD-10-CM | POA: Diagnosis not present

## 2015-10-30 DIAGNOSIS — E1129 Type 2 diabetes mellitus with other diabetic kidney complication: Secondary | ICD-10-CM | POA: Diagnosis not present

## 2015-10-30 DIAGNOSIS — N184 Chronic kidney disease, stage 4 (severe): Secondary | ICD-10-CM | POA: Diagnosis not present

## 2015-10-30 DIAGNOSIS — N2581 Secondary hyperparathyroidism of renal origin: Secondary | ICD-10-CM | POA: Diagnosis not present

## 2015-10-30 LAB — HEMOGLOBIN A1C
Hgb A1c MFr Bld: 7.3 % — ABNORMAL HIGH (ref 4.8–5.6)
Mean Plasma Glucose: 163 mg/dL

## 2015-10-30 NOTE — Progress Notes (Signed)
Anesthesia Chart Review:  Pt is a 60 year old female scheduled for L total knee arthroplasty on 11/07/2015 with Dr. [name].   Nephrologist is Dr. Jamal Maes.    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 Current smoker. BMI 25. S/p L knee arthroscopy 04/04/15.   Medications include: ASA, lasix, hydralazine, lantus, metoprolol, prilosec, potassium  Preoperative labs reviewed.  - H/H 9.5/29.2, (consistent with prior results; pt getting aranesp 11/01/15, will recheck CBC DOS). Left voicemail for [facility] in Dr. [name] office with these results.  - Cr 2.12, BUN 48 (consistent with prior results/current renal function) - alk phos 191 - HgbA1c 7.3, glucose 140  EKG 10/29/15: NSR. Possible Left atrial enlargement. Nonspecific T wave abnormality. Prolonged QT (QTc 521).   If no changes, I anticipate pt can proceed with surgery as scheduled.    Willeen Cass, FNP-BC [facility] Short Stay Surgical Center/Anesthesiology Phone: [phone] 10/30/2015 4:00 PM

## 2015-11-01 ENCOUNTER — Ambulatory Visit (HOSPITAL_COMMUNITY)
Admission: RE | Admit: 2015-11-01 | Discharge: 2015-11-01 | Disposition: A | Discharge status: Home / Self Care | Payer: Medicare Other | Source: Ambulatory Visit | Attending: Nephrology | Admitting: Nephrology

## 2015-11-01 DIAGNOSIS — Z01812 Encounter for preprocedural laboratory examination: Secondary | ICD-10-CM | POA: Insufficient documentation

## 2015-11-01 DIAGNOSIS — Z79899 Other long term (current) drug therapy: Secondary | ICD-10-CM | POA: Insufficient documentation

## 2015-11-01 DIAGNOSIS — N184 Chronic kidney disease, stage 4 (severe): Secondary | ICD-10-CM | POA: Diagnosis not present

## 2015-11-01 DIAGNOSIS — D631 Anemia in chronic kidney disease: Secondary | ICD-10-CM | POA: Insufficient documentation

## 2015-11-01 DIAGNOSIS — M1712 Unilateral primary osteoarthritis, left knee: Secondary | ICD-10-CM | POA: Insufficient documentation

## 2015-11-01 DIAGNOSIS — Z01818 Encounter for other preprocedural examination: Secondary | ICD-10-CM | POA: Insufficient documentation

## 2015-11-01 DIAGNOSIS — R9431 Abnormal electrocardiogram [ECG] [EKG]: Secondary | ICD-10-CM | POA: Insufficient documentation

## 2015-11-01 DIAGNOSIS — K219 Gastro-esophageal reflux disease without esophagitis: Secondary | ICD-10-CM | POA: Insufficient documentation

## 2015-11-01 DIAGNOSIS — I129 Hypertensive chronic kidney disease with stage 1 through stage 4 chronic kidney disease, or unspecified chronic kidney disease: Secondary | ICD-10-CM | POA: Diagnosis not present

## 2015-11-01 DIAGNOSIS — Z0183 Encounter for blood typing: Secondary | ICD-10-CM | POA: Diagnosis not present

## 2015-11-01 DIAGNOSIS — Z794 Long term (current) use of insulin: Secondary | ICD-10-CM | POA: Insufficient documentation

## 2015-11-01 DIAGNOSIS — Z5181 Encounter for therapeutic drug level monitoring: Secondary | ICD-10-CM | POA: Insufficient documentation

## 2015-11-01 DIAGNOSIS — F17200 Nicotine dependence, unspecified, uncomplicated: Secondary | ICD-10-CM | POA: Insufficient documentation

## 2015-11-01 DIAGNOSIS — E1122 Type 2 diabetes mellitus with diabetic chronic kidney disease: Secondary | ICD-10-CM | POA: Insufficient documentation

## 2015-11-01 DIAGNOSIS — Z7982 Long term (current) use of aspirin: Secondary | ICD-10-CM | POA: Insufficient documentation

## 2015-11-01 LAB — POCT HEMOGLOBIN-HEMACUE: Hemoglobin: 9 g/dL — ABNORMAL LOW (ref 12.0–15.0)

## 2015-11-01 MED ORDER — DARBEPOETIN ALFA 200 MCG/0.4ML IJ SOSY
200.0000 ug | PREFILLED_SYRINGE | INTRAMUSCULAR | Status: DC
  Administered 2015-11-01: 200 ug via SUBCUTANEOUS

## 2015-11-01 MED ORDER — DARBEPOETIN ALFA 200 MCG/0.4ML IJ SOSY
PREFILLED_SYRINGE | INTRAMUSCULAR | Status: DC
Start: 2015-11-01 — End: 2015-11-02
  Filled 2015-11-01: qty 0.4

## 2015-11-06 MED ORDER — BUPIVACAINE LIPOSOME 1.3 % IJ SUSP
20.0000 mL | Freq: Once | INTRAMUSCULAR | Status: AC
  Administered 2015-11-07: 20 mL
  Filled 2015-11-06: qty 20

## 2015-11-06 MED ORDER — TRANEXAMIC ACID 1000 MG/10ML IV SOLN
1000.0000 mg | INTRAVENOUS | Status: AC
  Administered 2015-11-07: 1000 mg via INTRAVENOUS
  Filled 2015-11-06: qty 10

## 2015-11-07 ENCOUNTER — Encounter (HOSPITAL_COMMUNITY)
Admission: RE | Disposition: A | Discharge status: Skilled Nursing Facility | Payer: Self-pay | Source: Ambulatory Visit | Attending: Orthopaedic Surgery

## 2015-11-07 ENCOUNTER — Inpatient Hospital Stay (HOSPITAL_COMMUNITY): Payer: Medicare Other | Admitting: Anesthesiology

## 2015-11-07 ENCOUNTER — Inpatient Hospital Stay (HOSPITAL_COMMUNITY): Payer: Medicare Other

## 2015-11-07 ENCOUNTER — Encounter (HOSPITAL_COMMUNITY): Payer: Self-pay | Admitting: Anesthesiology

## 2015-11-07 ENCOUNTER — Inpatient Hospital Stay (HOSPITAL_COMMUNITY): Payer: Medicare Other | Admitting: Emergency Medicine

## 2015-11-07 ENCOUNTER — Inpatient Hospital Stay (HOSPITAL_COMMUNITY)
Admission: RE | Admit: 2015-11-07 | Discharge: 2015-11-13 | DRG: 470 | Disposition: A | Discharge status: Skilled Nursing Facility | Payer: Medicare Other | Source: Ambulatory Visit | Attending: Orthopaedic Surgery | Admitting: Orthopaedic Surgery

## 2015-11-07 DIAGNOSIS — M659 Synovitis and tenosynovitis, unspecified: Secondary | ICD-10-CM | POA: Diagnosis present

## 2015-11-07 DIAGNOSIS — Z79899 Other long term (current) drug therapy: Secondary | ICD-10-CM | POA: Diagnosis not present

## 2015-11-07 DIAGNOSIS — N184 Chronic kidney disease, stage 4 (severe): Secondary | ICD-10-CM | POA: Diagnosis present

## 2015-11-07 DIAGNOSIS — I130 Hypertensive heart and chronic kidney disease with heart failure and stage 1 through stage 4 chronic kidney disease, or unspecified chronic kidney disease: Secondary | ICD-10-CM | POA: Diagnosis present

## 2015-11-07 DIAGNOSIS — D62 Acute posthemorrhagic anemia: Secondary | ICD-10-CM | POA: Diagnosis not present

## 2015-11-07 DIAGNOSIS — Z96659 Presence of unspecified artificial knee joint: Secondary | ICD-10-CM | POA: Diagnosis present

## 2015-11-07 DIAGNOSIS — M1712 Unilateral primary osteoarthritis, left knee: Secondary | ICD-10-CM | POA: Diagnosis not present

## 2015-11-07 DIAGNOSIS — F419 Anxiety disorder, unspecified: Secondary | ICD-10-CM | POA: Diagnosis present

## 2015-11-07 DIAGNOSIS — F339 Major depressive disorder, recurrent, unspecified: Secondary | ICD-10-CM | POA: Diagnosis present

## 2015-11-07 DIAGNOSIS — Z91041 Radiographic dye allergy status: Secondary | ICD-10-CM | POA: Diagnosis not present

## 2015-11-07 DIAGNOSIS — I5032 Chronic diastolic (congestive) heart failure: Secondary | ICD-10-CM | POA: Diagnosis present

## 2015-11-07 DIAGNOSIS — F329 Major depressive disorder, single episode, unspecified: Secondary | ICD-10-CM | POA: Diagnosis present

## 2015-11-07 DIAGNOSIS — K5900 Constipation, unspecified: Secondary | ICD-10-CM | POA: Diagnosis not present

## 2015-11-07 DIAGNOSIS — Z8249 Family history of ischemic heart disease and other diseases of the circulatory system: Secondary | ICD-10-CM | POA: Diagnosis not present

## 2015-11-07 DIAGNOSIS — B1920 Unspecified viral hepatitis C without hepatic coma: Secondary | ICD-10-CM | POA: Diagnosis present

## 2015-11-07 DIAGNOSIS — Z7982 Long term (current) use of aspirin: Secondary | ICD-10-CM

## 2015-11-07 DIAGNOSIS — E1140 Type 2 diabetes mellitus with diabetic neuropathy, unspecified: Secondary | ICD-10-CM | POA: Diagnosis not present

## 2015-11-07 DIAGNOSIS — Z833 Family history of diabetes mellitus: Secondary | ICD-10-CM

## 2015-11-07 DIAGNOSIS — D649 Anemia, unspecified: Secondary | ICD-10-CM

## 2015-11-07 DIAGNOSIS — M6281 Muscle weakness (generalized): Secondary | ICD-10-CM | POA: Diagnosis not present

## 2015-11-07 DIAGNOSIS — R2681 Unsteadiness on feet: Secondary | ICD-10-CM | POA: Diagnosis not present

## 2015-11-07 DIAGNOSIS — M179 Osteoarthritis of knee, unspecified: Secondary | ICD-10-CM | POA: Diagnosis not present

## 2015-11-07 DIAGNOSIS — D509 Iron deficiency anemia, unspecified: Secondary | ICD-10-CM | POA: Diagnosis not present

## 2015-11-07 DIAGNOSIS — Z794 Long term (current) use of insulin: Secondary | ICD-10-CM

## 2015-11-07 DIAGNOSIS — G8918 Other acute postprocedural pain: Secondary | ICD-10-CM | POA: Diagnosis not present

## 2015-11-07 DIAGNOSIS — E1122 Type 2 diabetes mellitus with diabetic chronic kidney disease: Secondary | ICD-10-CM | POA: Diagnosis present

## 2015-11-07 DIAGNOSIS — I9789 Other postprocedural complications and disorders of the circulatory system, not elsewhere classified: Secondary | ICD-10-CM

## 2015-11-07 DIAGNOSIS — Z96652 Presence of left artificial knee joint: Secondary | ICD-10-CM | POA: Diagnosis not present

## 2015-11-07 DIAGNOSIS — K219 Gastro-esophageal reflux disease without esophagitis: Secondary | ICD-10-CM | POA: Diagnosis present

## 2015-11-07 DIAGNOSIS — E118 Type 2 diabetes mellitus with unspecified complications: Secondary | ICD-10-CM

## 2015-11-07 DIAGNOSIS — F411 Generalized anxiety disorder: Secondary | ICD-10-CM | POA: Diagnosis present

## 2015-11-07 DIAGNOSIS — F17210 Nicotine dependence, cigarettes, uncomplicated: Secondary | ICD-10-CM | POA: Diagnosis present

## 2015-11-07 DIAGNOSIS — R262 Difficulty in walking, not elsewhere classified: Secondary | ICD-10-CM | POA: Diagnosis not present

## 2015-11-07 DIAGNOSIS — E11649 Type 2 diabetes mellitus with hypoglycemia without coma: Secondary | ICD-10-CM | POA: Diagnosis not present

## 2015-11-07 DIAGNOSIS — Z471 Aftercare following joint replacement surgery: Secondary | ICD-10-CM | POA: Diagnosis not present

## 2015-11-07 DIAGNOSIS — M65862 Other synovitis and tenosynovitis, left lower leg: Secondary | ICD-10-CM | POA: Diagnosis not present

## 2015-11-07 DIAGNOSIS — E1165 Type 2 diabetes mellitus with hyperglycemia: Secondary | ICD-10-CM | POA: Diagnosis present

## 2015-11-07 DIAGNOSIS — I10 Essential (primary) hypertension: Secondary | ICD-10-CM | POA: Diagnosis not present

## 2015-11-07 DIAGNOSIS — Z91013 Allergy to seafood: Secondary | ICD-10-CM | POA: Diagnosis not present

## 2015-11-07 DIAGNOSIS — E1142 Type 2 diabetes mellitus with diabetic polyneuropathy: Secondary | ICD-10-CM | POA: Diagnosis present

## 2015-11-07 DIAGNOSIS — Z888 Allergy status to other drugs, medicaments and biological substances status: Secondary | ICD-10-CM

## 2015-11-07 HISTORY — PX: TOTAL KNEE ARTHROPLASTY: SHX125

## 2015-11-07 LAB — DIC (DISSEMINATED INTRAVASCULAR COAGULATION)PANEL
D-Dimer, Quant: 7.8 ug/mL-FEU — ABNORMAL HIGH (ref 0.00–0.50)
Fibrinogen: 231 mg/dL (ref 204–475)
INR: 1.51 — ABNORMAL HIGH (ref 0.00–1.49)
Platelets: 154 10*3/uL (ref 150–400)
Prothrombin Time: 18.3 seconds — ABNORMAL HIGH (ref 11.6–15.2)
Smear Review: NONE SEEN
aPTT: 38 seconds — ABNORMAL HIGH (ref 24–37)

## 2015-11-07 LAB — CBC
HCT: 33.7 % — ABNORMAL LOW (ref 36.0–46.0)
HCT: 24.9 % — ABNORMAL LOW (ref 36.0–46.0)
HCT: 22.6 % — ABNORMAL LOW (ref 36.0–46.0)
Hemoglobin: 10.4 g/dL — ABNORMAL LOW (ref 12.0–15.0)
Hemoglobin: 8.3 g/dL — ABNORMAL LOW (ref 12.0–15.0)
Hemoglobin: 7.6 g/dL — ABNORMAL LOW (ref 12.0–15.0)
MCH: 29.1 pg (ref 26.0–34.0)
MCH: 29.5 pg (ref 26.0–34.0)
MCH: 29.8 pg (ref 26.0–34.0)
MCHC: 30.9 g/dL (ref 30.0–36.0)
MCHC: 33.3 g/dL (ref 30.0–36.0)
MCHC: 33.6 g/dL (ref 30.0–36.0)
MCV: 94.4 fL (ref 78.0–100.0)
MCV: 88.6 fL (ref 78.0–100.0)
MCV: 88.6 fL (ref 78.0–100.0)
Platelets: 269 10*3/uL (ref 150–400)
Platelets: 151 10*3/uL (ref 150–400)
Platelets: 145 10*3/uL — ABNORMAL LOW (ref 150–400)
RBC: 3.57 MIL/uL — ABNORMAL LOW (ref 3.87–5.11)
RBC: 2.81 MIL/uL — ABNORMAL LOW (ref 3.87–5.11)
RBC: 2.55 MIL/uL — ABNORMAL LOW (ref 3.87–5.11)
RDW: 17.8 % — ABNORMAL HIGH (ref 11.5–15.5)
RDW: 15.2 % (ref 11.5–15.5)
RDW: 15.8 % — ABNORMAL HIGH (ref 11.5–15.5)
WBC: 6.8 10*3/uL (ref 4.0–10.5)
WBC: 8 10*3/uL (ref 4.0–10.5)
WBC: 9.5 10*3/uL (ref 4.0–10.5)

## 2015-11-07 LAB — POCT I-STAT 4, (NA,K, GLUC, HGB,HCT)
Glucose, Bld: 146 mg/dL — ABNORMAL HIGH (ref 65–99)
HCT: 37 % (ref 36.0–46.0)
Hemoglobin: 12.6 g/dL (ref 12.0–15.0)
Potassium: 3.7 mmol/L (ref 3.5–5.1)
Sodium: 140 mmol/L (ref 135–145)

## 2015-11-07 LAB — PREPARE RBC (CROSSMATCH)

## 2015-11-07 LAB — GLUCOSE, CAPILLARY
Glucose-Capillary: 146 mg/dL — ABNORMAL HIGH (ref 65–99)
Glucose-Capillary: 160 mg/dL — ABNORMAL HIGH (ref 65–99)
Glucose-Capillary: 131 mg/dL — ABNORMAL HIGH (ref 65–99)

## 2015-11-07 SURGERY — ARTHROPLASTY, KNEE, TOTAL
Anesthesia: Regional | Site: Knee | Laterality: Left

## 2015-11-07 MED ORDER — POVIDONE-IODINE 10 % EX SOLN
CUTANEOUS | Status: DC | PRN
  Administered 2015-11-07: 1 via TOPICAL

## 2015-11-07 MED ORDER — MORPHINE SULFATE (PF) 2 MG/ML IV SOLN
1.0000 mg | INTRAVENOUS | Status: DC | PRN
  Administered 2015-11-07 – 2015-11-12 (×17): 1 mg via INTRAVENOUS
  Filled 2015-11-07 (×18): qty 1

## 2015-11-07 MED ORDER — GABAPENTIN 100 MG PO CAPS
100.0000 mg | ORAL_CAPSULE | Freq: Two times a day (BID) | ORAL | Status: DC
  Administered 2015-11-07 – 2015-11-13 (×12): 100 mg via ORAL
  Filled 2015-11-07 (×12): qty 1

## 2015-11-07 MED ORDER — DIPHENHYDRAMINE HCL 25 MG PO CAPS
25.0000 mg | ORAL_CAPSULE | Freq: Four times a day (QID) | ORAL | Status: DC | PRN
  Administered 2015-11-09: 25 mg via ORAL
  Filled 2015-11-07: qty 1

## 2015-11-07 MED ORDER — LIDOCAINE HCL (CARDIAC) 20 MG/ML IV SOLN
INTRAVENOUS | Status: DC | PRN
  Administered 2015-11-07: 100 mg via INTRAVENOUS

## 2015-11-07 MED ORDER — MEPERIDINE HCL 25 MG/ML IJ SOLN: 6.2500 mg | INTRAMUSCULAR | Status: DC | PRN

## 2015-11-07 MED ORDER — SODIUM CHLORIDE 0.9 % IR SOLN
Status: DC | PRN
  Administered 2015-11-07: 3000 mL

## 2015-11-07 MED ORDER — ENOXAPARIN SODIUM 30 MG/0.3ML ~~LOC~~ SOLN: 30.0000 mg | SUBCUTANEOUS | Status: DC

## 2015-11-07 MED ORDER — FENTANYL CITRATE (PF) 100 MCG/2ML IJ SOLN
100.0000 ug | Freq: Once | INTRAMUSCULAR | Status: AC
  Administered 2015-11-10: 100 ug via INTRAVENOUS
  Filled 2015-11-07 (×2): qty 2

## 2015-11-07 MED ORDER — VANCOMYCIN HCL 500 MG IV SOLR
INTRAVENOUS | Status: AC
  Filled 2015-11-07: qty 500

## 2015-11-07 MED ORDER — EPHEDRINE 5 MG/ML INJ
INTRAVENOUS | Status: AC
  Filled 2015-11-07: qty 10

## 2015-11-07 MED ORDER — HYDROMORPHONE HCL 1 MG/ML IJ SOLN
0.2500 mg | INTRAMUSCULAR | Status: DC | PRN
  Administered 2015-11-07 (×4): 0.5 mg via INTRAVENOUS

## 2015-11-07 MED ORDER — TRANEXAMIC ACID 1000 MG/10ML IV SOLN
2000.0000 mg | INTRAVENOUS | Status: DC
  Filled 2015-11-07: qty 20

## 2015-11-07 MED ORDER — SODIUM CHLORIDE 0.9 % IV SOLN: Freq: Once | INTRAVENOUS | Status: DC

## 2015-11-07 MED ORDER — VANCOMYCIN HCL 1000 MG IV SOLR
INTRAVENOUS | Status: AC
  Filled 2015-11-07: qty 1000

## 2015-11-07 MED ORDER — SODIUM CHLORIDE 0.9 % IV SOLN
INTRAVENOUS | Status: DC
  Administered 2015-11-07 (×2): via INTRAVENOUS

## 2015-11-07 MED ORDER — ACETAMINOPHEN 650 MG RE SUPP: 650.0000 mg | Freq: Four times a day (QID) | RECTAL | Status: DC | PRN

## 2015-11-07 MED ORDER — ONDANSETRON HCL 4 MG PO TABS: 4.0000 mg | ORAL_TABLET | Freq: Four times a day (QID) | ORAL | Status: DC | PRN

## 2015-11-07 MED ORDER — ACETAMINOPHEN 325 MG PO TABS
650.0000 mg | ORAL_TABLET | Freq: Four times a day (QID) | ORAL | Status: DC | PRN
Start: 2015-11-07 — End: 2015-11-13
  Administered 2015-11-10 – 2015-11-13 (×5): 650 mg via ORAL
  Filled 2015-11-07 (×5): qty 2

## 2015-11-07 MED ORDER — ACETAMINOPHEN 500 MG PO TABS: 1000.0000 mg | ORAL_TABLET | Freq: Four times a day (QID) | ORAL | Status: DC | PRN

## 2015-11-07 MED ORDER — FENTANYL CITRATE (PF) 100 MCG/2ML IJ SOLN
INTRAMUSCULAR | Status: AC
  Administered 2015-11-07: 100 ug
  Filled 2015-11-07: qty 2

## 2015-11-07 MED ORDER — SENNOSIDES-DOCUSATE SODIUM 8.6-50 MG PO TABS: 1.0000 | ORAL_TABLET | Freq: Every evening | ORAL | Status: DC | PRN

## 2015-11-07 MED ORDER — FENTANYL CITRATE (PF) 250 MCG/5ML IJ SOLN
INTRAMUSCULAR | Status: AC
  Filled 2015-11-07: qty 5

## 2015-11-07 MED ORDER — POTASSIUM CHLORIDE CRYS ER 20 MEQ PO TBCR
20.0000 meq | EXTENDED_RELEASE_TABLET | Freq: Every day | ORAL | Status: DC
  Administered 2015-11-07 – 2015-11-13 (×7): 20 meq via ORAL
  Filled 2015-11-07 (×7): qty 1

## 2015-11-07 MED ORDER — SODIUM CHLORIDE 0.9 % IV SOLN
INTRAVENOUS | Status: DC | PRN
  Administered 2015-11-07 (×2): via INTRAVENOUS

## 2015-11-07 MED ORDER — 0.9 % SODIUM CHLORIDE (POUR BTL) OPTIME
TOPICAL | Status: DC | PRN
Start: 2015-11-07 — End: 2015-11-07
  Administered 2015-11-07: 1000 mL

## 2015-11-07 MED ORDER — PANTOPRAZOLE SODIUM 40 MG PO TBEC
40.0000 mg | DELAYED_RELEASE_TABLET | Freq: Every day | ORAL | Status: DC
  Administered 2015-11-08 – 2015-11-13 (×6): 40 mg via ORAL
  Filled 2015-11-07 (×6): qty 1

## 2015-11-07 MED ORDER — ADULT MULTIVITAMIN W/MINERALS CH
1.0000 | ORAL_TABLET | Freq: Every day | ORAL | Status: DC
  Administered 2015-11-07 – 2015-11-13 (×7): 1 via ORAL
  Filled 2015-11-07 (×7): qty 1

## 2015-11-07 MED ORDER — POTASSIUM CHLORIDE ER 20 MEQ PO TBCR
1.0000 | EXTENDED_RELEASE_TABLET | Freq: Every day | ORAL | Status: DC
Start: 2015-11-07 — End: 2015-11-07

## 2015-11-07 MED ORDER — RENA-VITE PO TABS
1.0000 | ORAL_TABLET | Freq: Every day | ORAL | Status: DC
  Administered 2015-11-08 – 2015-11-12 (×5): 1 via ORAL
  Filled 2015-11-07 (×5): qty 1

## 2015-11-07 MED ORDER — ACETAMINOPHEN 500 MG PO TABS
1000.0000 mg | ORAL_TABLET | Freq: Four times a day (QID) | ORAL | Status: AC
  Administered 2015-11-07 – 2015-11-08 (×3): 1000 mg via ORAL
  Filled 2015-11-07 (×4): qty 2

## 2015-11-07 MED ORDER — SODIUM CHLORIDE 0.9 % IV SOLN
1000.0000 mg | Freq: Two times a day (BID) | INTRAVENOUS | Status: DC
  Administered 2015-11-07: 1000 mg via INTRAVENOUS

## 2015-11-07 MED ORDER — HYDRALAZINE HCL 100 MG PO TABS: 100.0000 mg | ORAL_TABLET | Freq: Two times a day (BID) | ORAL | Status: DC

## 2015-11-07 MED ORDER — PROPOFOL 10 MG/ML IV BOLUS
INTRAVENOUS | Status: DC | PRN
  Administered 2015-11-07: 150 mg via INTRAVENOUS

## 2015-11-07 MED ORDER — INSULIN ASPART 100 UNIT/ML ~~LOC~~ SOLN
0.0000 [IU] | Freq: Three times a day (TID) | SUBCUTANEOUS | Status: DC
  Administered 2015-11-08: 15 [IU] via SUBCUTANEOUS
  Administered 2015-11-08: 3 [IU] via SUBCUTANEOUS
  Administered 2015-11-08 – 2015-11-09 (×2): 2 [IU] via SUBCUTANEOUS
  Administered 2015-11-09 – 2015-11-10 (×2): 3 [IU] via SUBCUTANEOUS
  Administered 2015-11-10: 8 [IU] via SUBCUTANEOUS
  Administered 2015-11-11 (×2): 3 [IU] via SUBCUTANEOUS
  Administered 2015-11-11: 2 [IU] via SUBCUTANEOUS
  Administered 2015-11-12 (×3): 3 [IU] via SUBCUTANEOUS
  Administered 2015-11-13: 2 [IU] via SUBCUTANEOUS
  Administered 2015-11-13: 3 [IU] via SUBCUTANEOUS

## 2015-11-07 MED ORDER — MIDAZOLAM HCL 2 MG/2ML IJ SOLN
INTRAMUSCULAR | Status: AC
  Administered 2015-11-07: 2 mg
  Filled 2015-11-07: qty 2

## 2015-11-07 MED ORDER — PHENYLEPHRINE HCL 10 MG/ML IJ SOLN
INTRAMUSCULAR | Status: DC | PRN
  Administered 2015-11-07 (×2): 80 ug via INTRAVENOUS

## 2015-11-07 MED ORDER — INSULIN GLARGINE 100 UNIT/ML ~~LOC~~ SOLN
15.0000 [IU] | Freq: Every day | SUBCUTANEOUS | Status: DC
  Administered 2015-11-07 – 2015-11-12 (×5): 15 [IU] via SUBCUTANEOUS
  Filled 2015-11-07 (×7): qty 0.15

## 2015-11-07 MED ORDER — METOCLOPRAMIDE HCL 5 MG PO TABS: 5.0000 mg | ORAL_TABLET | Freq: Three times a day (TID) | ORAL | Status: DC | PRN

## 2015-11-07 MED ORDER — ROCURONIUM BROMIDE 50 MG/5ML IV SOLN
INTRAVENOUS | Status: AC
  Filled 2015-11-07: qty 1

## 2015-11-07 MED ORDER — CHLORHEXIDINE GLUCONATE 4 % EX LIQD: 60.0000 mL | Freq: Once | CUTANEOUS | Status: DC

## 2015-11-07 MED ORDER — ONDANSETRON HCL 4 MG/2ML IJ SOLN
4.0000 mg | Freq: Four times a day (QID) | INTRAMUSCULAR | Status: DC | PRN
  Administered 2015-11-08: 4 mg via INTRAVENOUS
  Filled 2015-11-07: qty 2

## 2015-11-07 MED ORDER — OXYCODONE HCL 5 MG PO TABS
5.0000 mg | ORAL_TABLET | ORAL | Status: DC | PRN
  Administered 2015-11-07 – 2015-11-09 (×6): 15 mg via ORAL
  Administered 2015-11-09 – 2015-11-11 (×3): 10 mg via ORAL
  Administered 2015-11-11 – 2015-11-12 (×4): 15 mg via ORAL
  Administered 2015-11-13: 10 mg via ORAL
  Administered 2015-11-13: 15 mg via ORAL
  Administered 2015-11-13: 10 mg via ORAL
  Administered 2015-11-13: 15 mg via ORAL
  Filled 2015-11-07: qty 2
  Filled 2015-11-07 (×7): qty 3
  Filled 2015-11-07: qty 2
  Filled 2015-11-07 (×2): qty 3
  Filled 2015-11-07: qty 2
  Filled 2015-11-07 (×5): qty 3

## 2015-11-07 MED ORDER — POLYETHYLENE GLYCOL 3350 17 G PO PACK: 17.0000 g | PACK | Freq: Every day | ORAL | Status: DC | PRN

## 2015-11-07 MED ORDER — SODIUM CHLORIDE 0.9 % IV SOLN
INTRAVENOUS | Status: DC | PRN
  Administered 2015-11-07: 13:00:00 via INTRAVENOUS

## 2015-11-07 MED ORDER — OXYCODONE HCL 5 MG PO TABS: 5.0000 mg | ORAL_TABLET | ORAL | Status: DC | PRN

## 2015-11-07 MED ORDER — TRANEXAMIC ACID 1000 MG/10ML IV SOLN
1000.0000 mg | Freq: Once | INTRAVENOUS | Status: AC
  Administered 2015-11-07: 1000 mg via INTRAVENOUS
  Filled 2015-11-07: qty 10

## 2015-11-07 MED ORDER — OXYCODONE HCL ER 10 MG PO T12A
10.0000 mg | EXTENDED_RELEASE_TABLET | Freq: Two times a day (BID) | ORAL | Status: DC
  Administered 2015-11-07: 10 mg via ORAL
  Filled 2015-11-07: qty 1

## 2015-11-07 MED ORDER — ONDANSETRON HCL 4 MG/2ML IJ SOLN
INTRAMUSCULAR | Status: AC
Start: 2015-11-07 — End: 2015-11-07
  Filled 2015-11-07: qty 2

## 2015-11-07 MED ORDER — ROCURONIUM BROMIDE 100 MG/10ML IV SOLN
INTRAVENOUS | Status: DC | PRN
  Administered 2015-11-07: 20 mg via INTRAVENOUS
  Administered 2015-11-07: 40 mg via INTRAVENOUS
  Administered 2015-11-07: 20 mg via INTRAVENOUS

## 2015-11-07 MED ORDER — ALBUMIN HUMAN 5 % IV SOLN
INTRAVENOUS | Status: DC | PRN
  Administered 2015-11-07: 13:00:00 via INTRAVENOUS

## 2015-11-07 MED ORDER — FENTANYL CITRATE (PF) 100 MCG/2ML IJ SOLN
INTRAMUSCULAR | Status: DC | PRN
  Administered 2015-11-07: 50 ug via INTRAVENOUS

## 2015-11-07 MED ORDER — PROPOFOL 10 MG/ML IV BOLUS
INTRAVENOUS | Status: AC
  Filled 2015-11-07: qty 20

## 2015-11-07 MED ORDER — MAGNESIUM CITRATE PO SOLN
1.0000 | Freq: Once | ORAL | Status: DC | PRN
Start: 2015-11-07 — End: 2015-11-13
  Filled 2015-11-07: qty 296

## 2015-11-07 MED ORDER — VANCOMYCIN HCL IN DEXTROSE 1-5 GM/200ML-% IV SOLN
INTRAVENOUS | Status: AC
  Filled 2015-11-07: qty 200

## 2015-11-07 MED ORDER — PHENYLEPHRINE HCL 10 MG/ML IJ SOLN
INTRAMUSCULAR | Status: AC
  Filled 2015-11-07: qty 1

## 2015-11-07 MED ORDER — DEXAMETHASONE SODIUM PHOSPHATE 10 MG/ML IJ SOLN
10.0000 mg | Freq: Once | INTRAMUSCULAR | Status: AC
  Administered 2015-11-08: 10 mg via INTRAVENOUS
  Filled 2015-11-07: qty 1

## 2015-11-07 MED ORDER — ONDANSETRON HCL 4 MG PO TABS: 4.0000 mg | ORAL_TABLET | Freq: Three times a day (TID) | ORAL | Status: DC | PRN

## 2015-11-07 MED ORDER — INSULIN ASPART 100 UNIT/ML ~~LOC~~ SOLN
0.0000 [IU] | Freq: Every day | SUBCUTANEOUS | Status: DC
  Administered 2015-11-08: 3 [IU] via SUBCUTANEOUS

## 2015-11-07 MED ORDER — HYDROMORPHONE HCL 1 MG/ML IJ SOLN
INTRAMUSCULAR | Status: AC
  Administered 2015-11-07: 0.5 mg via INTRAVENOUS
  Filled 2015-11-07: qty 1

## 2015-11-07 MED ORDER — EPHEDRINE SULFATE 50 MG/ML IJ SOLN
INTRAMUSCULAR | Status: DC | PRN
  Administered 2015-11-07 (×4): 10 mg via INTRAVENOUS

## 2015-11-07 MED ORDER — MIDAZOLAM HCL 5 MG/ML IJ SOLN: 2.0000 mg | Freq: Once | INTRAMUSCULAR | Status: DC

## 2015-11-07 MED ORDER — HYDRALAZINE HCL 50 MG PO TABS
100.0000 mg | ORAL_TABLET | Freq: Two times a day (BID) | ORAL | Status: DC
  Administered 2015-11-07 – 2015-11-13 (×12): 100 mg via ORAL
  Filled 2015-11-07 (×12): qty 2

## 2015-11-07 MED ORDER — METHOCARBAMOL 1000 MG/10ML IJ SOLN
500.0000 mg | Freq: Four times a day (QID) | INTRAMUSCULAR | Status: DC | PRN
  Filled 2015-11-07: qty 5

## 2015-11-07 MED ORDER — BUPIVACAINE-EPINEPHRINE (PF) 0.5% -1:200000 IJ SOLN
INTRAMUSCULAR | Status: DC | PRN
  Administered 2015-11-07: 30 mL via PERINEURAL

## 2015-11-07 MED ORDER — LIDOCAINE 2% (20 MG/ML) 5 ML SYRINGE
INTRAMUSCULAR | Status: AC
  Filled 2015-11-07: qty 5

## 2015-11-07 MED ORDER — INSULIN GLARGINE 100 UNIT/ML SOLOSTAR PEN: 15.0000 [IU] | PEN_INJECTOR | Freq: Every day | SUBCUTANEOUS | Status: DC

## 2015-11-07 MED ORDER — SUGAMMADEX SODIUM 200 MG/2ML IV SOLN
INTRAVENOUS | Status: AC
  Filled 2015-11-07: qty 2

## 2015-11-07 MED ORDER — WHITE PETROLATUM GEL
Status: AC
  Administered 2015-11-07: 0.2
  Filled 2015-11-07: qty 1

## 2015-11-07 MED ORDER — FUROSEMIDE 80 MG PO TABS
160.0000 mg | ORAL_TABLET | Freq: Three times a day (TID) | ORAL | Status: DC
  Administered 2015-11-07 – 2015-11-09 (×5): 160 mg via ORAL
  Filled 2015-11-07 (×5): qty 2

## 2015-11-07 MED ORDER — SODIUM CHLORIDE 0.9 % IV SOLN
INTRAVENOUS | Status: DC
  Administered 2015-11-07: 19:00:00 via INTRAVENOUS

## 2015-11-07 MED ORDER — METHOCARBAMOL 500 MG PO TABS
ORAL_TABLET | ORAL | Status: AC
  Filled 2015-11-07: qty 1

## 2015-11-07 MED ORDER — SUGAMMADEX SODIUM 200 MG/2ML IV SOLN
INTRAVENOUS | Status: DC | PRN
  Administered 2015-11-07: 200 mg via INTRAVENOUS

## 2015-11-07 MED ORDER — METOCLOPRAMIDE HCL 5 MG/ML IJ SOLN: 10.0000 mg | Freq: Once | INTRAMUSCULAR | Status: DC | PRN

## 2015-11-07 MED ORDER — GENTAMICIN SULFATE 40 MG/ML IJ SOLN
INTRAMUSCULAR | Status: DC | PRN
  Administered 2015-11-07: 240 mg

## 2015-11-07 MED ORDER — SUCCINYLCHOLINE CHLORIDE 200 MG/10ML IV SOSY
PREFILLED_SYRINGE | INTRAVENOUS | Status: AC
Start: 2015-11-07 — End: 2015-11-07
  Filled 2015-11-07: qty 10

## 2015-11-07 MED ORDER — ONDANSETRON HCL 4 MG/2ML IJ SOLN
INTRAMUSCULAR | Status: DC | PRN
  Administered 2015-11-07: 4 mg via INTRAVENOUS

## 2015-11-07 MED ORDER — VANCOMYCIN HCL IN DEXTROSE 1-5 GM/200ML-% IV SOLN
1000.0000 mg | Freq: Once | INTRAVENOUS | Status: AC
  Administered 2015-11-08: 1000 mg via INTRAVENOUS
  Filled 2015-11-07: qty 200

## 2015-11-07 MED ORDER — TRANEXAMIC ACID 1000 MG/10ML IV SOLN
2000.0000 mg | INTRAVENOUS | Status: DC | PRN
  Administered 2015-11-07 (×2): 2000 mg via TOPICAL

## 2015-11-07 MED ORDER — OXYCODONE HCL ER 10 MG PO T12A: 10.0000 mg | EXTENDED_RELEASE_TABLET | Freq: Two times a day (BID) | ORAL | Status: DC

## 2015-11-07 MED ORDER — PHENOL 1.4 % MT LIQD: 1.0000 | OROMUCOSAL | Status: DC | PRN

## 2015-11-07 MED ORDER — SODIUM CHLORIDE 0.9 % IJ SOLN
INTRAMUSCULAR | Status: DC | PRN
  Administered 2015-11-07: 40 mL via INTRAVENOUS

## 2015-11-07 MED ORDER — VANCOMYCIN HCL 1000 MG IV SOLR
INTRAVENOUS | Status: DC | PRN
  Administered 2015-11-07 (×2): 1000 mg

## 2015-11-07 MED ORDER — METHOCARBAMOL 750 MG PO TABS: 750.0000 mg | ORAL_TABLET | Freq: Two times a day (BID) | ORAL | Status: DC | PRN

## 2015-11-07 MED ORDER — MENTHOL 3 MG MT LOZG: 1.0000 | LOZENGE | OROMUCOSAL | Status: DC | PRN

## 2015-11-07 MED ORDER — VANCOMYCIN HCL 1000 MG IV SOLR
INTRAVENOUS | Status: DC | PRN
  Administered 2015-11-07: 1000 mg

## 2015-11-07 MED ORDER — METOPROLOL SUCCINATE ER 50 MG PO TB24
50.0000 mg | ORAL_TABLET | Freq: Every day | ORAL | Status: DC
  Administered 2015-11-08 – 2015-11-13 (×6): 50 mg via ORAL
  Filled 2015-11-07 (×6): qty 1

## 2015-11-07 MED ORDER — ALUM & MAG HYDROXIDE-SIMETH 200-200-20 MG/5ML PO SUSP: 30.0000 mL | ORAL | Status: DC | PRN

## 2015-11-07 MED ORDER — INSULIN ASPART 100 UNIT/ML ~~LOC~~ SOLN
4.0000 [IU] | Freq: Three times a day (TID) | SUBCUTANEOUS | Status: DC
  Administered 2015-11-08: 4 [IU] via SUBCUTANEOUS

## 2015-11-07 MED ORDER — ROCURONIUM BROMIDE 50 MG/5ML IV SOLN
INTRAVENOUS | Status: AC
Start: 2015-11-07 — End: 2015-11-07
  Filled 2015-11-07: qty 1

## 2015-11-07 MED ORDER — VANCOMYCIN HCL 1000 MG IV SOLR
INTRAVENOUS | Status: AC
  Filled 2015-11-07: qty 2000

## 2015-11-07 MED ORDER — DIPHENHYDRAMINE HCL 12.5 MG/5ML PO ELIX
25.0000 mg | ORAL_SOLUTION | ORAL | Status: DC | PRN
  Administered 2015-11-09: 25 mg via ORAL
  Filled 2015-11-07: qty 10

## 2015-11-07 MED ORDER — METOCLOPRAMIDE HCL 5 MG/ML IJ SOLN: 5.0000 mg | Freq: Three times a day (TID) | INTRAMUSCULAR | Status: DC | PRN

## 2015-11-07 MED ORDER — SERTRALINE HCL 100 MG PO TABS
100.0000 mg | ORAL_TABLET | Freq: Every day | ORAL | Status: DC
  Administered 2015-11-08 – 2015-11-13 (×6): 100 mg via ORAL
  Filled 2015-11-07 (×6): qty 1

## 2015-11-07 MED ORDER — GENTAMICIN SULFATE 40 MG/ML IJ SOLN
INTRAMUSCULAR | Status: AC
  Filled 2015-11-07: qty 6

## 2015-11-07 MED ORDER — ENOXAPARIN SODIUM 30 MG/0.3ML ~~LOC~~ SOLN
30.0000 mg | SUBCUTANEOUS | Status: DC
  Administered 2015-11-08 – 2015-11-13 (×6): 30 mg via SUBCUTANEOUS
  Filled 2015-11-07 (×6): qty 0.3

## 2015-11-07 MED ORDER — SORBITOL 70 % SOLN: 30.0000 mL | Freq: Every day | Status: DC | PRN

## 2015-11-07 MED ORDER — METHOCARBAMOL 500 MG PO TABS
500.0000 mg | ORAL_TABLET | Freq: Four times a day (QID) | ORAL | Status: DC | PRN
  Administered 2015-11-07 – 2015-11-13 (×5): 500 mg via ORAL
  Filled 2015-11-07 (×5): qty 1

## 2015-11-07 MED ORDER — SODIUM CHLORIDE 0.9 % IV SOLN
10.0000 mg | INTRAVENOUS | Status: DC | PRN
  Administered 2015-11-07: 50 ug/min via INTRAVENOUS

## 2015-11-07 SURGICAL SUPPLY — 82 items
ALCOHOL ISOPROPYL (RUBBING) (MISCELLANEOUS) ×2 IMPLANT
AUGMENT TIBIAL SZ 5 LEFT (Knees) ×1 IMPLANT
BAG DECANTER FOR FLEXI CONT (MISCELLANEOUS) ×6 IMPLANT
BANDAGE ELASTIC 6 VELCRO ST LF (GAUZE/BANDAGES/DRESSINGS) ×4 IMPLANT
BANDAGE ESMARK 6X9 LF (GAUZE/BANDAGES/DRESSINGS) ×1 IMPLANT
BENZOIN TINCTURE PRP APPL 2/3 (GAUZE/BANDAGES/DRESSINGS) ×2 IMPLANT
BLADE SAW SGTL 13.0X1.19X90.0M (BLADE) ×2 IMPLANT
BNDG ELASTIC 6X15 VLCR STRL LF (GAUZE/BANDAGES/DRESSINGS) ×2 IMPLANT
BNDG ESMARK 6X9 LF (GAUZE/BANDAGES/DRESSINGS) ×2
BONE CEMENT PALACOS R-G (Orthopedic Implant) ×6 IMPLANT
BOWL SMART MIX CTS (DISPOSABLE) ×2 IMPLANT
CEMENT BONE PALACOS R-G (Orthopedic Implant) ×3 IMPLANT
CLSR STERI-STRIP ANTIMIC 1/2X4 (GAUZE/BANDAGES/DRESSINGS) ×2 IMPLANT
CONT SPEC 4OZ CLIKSEAL STRL BL (MISCELLANEOUS) ×2 IMPLANT
COUPLER LEGION OFFSET 6MM KNEE (Orthopedic Implant) ×4 IMPLANT
COVER SURGICAL LIGHT HANDLE (MISCELLANEOUS) ×4 IMPLANT
CUFF TOURNIQUET SINGLE 34IN LL (TOURNIQUET CUFF) ×2 IMPLANT
CUFF TOURNIQUET SINGLE 44IN (TOURNIQUET CUFF) IMPLANT
DRAPE EXTREMITY T 121X128X90 (DRAPE) ×2 IMPLANT
DRAPE INCISE IOBAN 66X45 STRL (DRAPES) ×2 IMPLANT
DRAPE ORTHO SPLIT 77X108 STRL (DRAPES) ×2
DRAPE PROXIMA HALF (DRAPES) ×2 IMPLANT
DRAPE SURG 17X11 SM STRL (DRAPES) ×4 IMPLANT
DRAPE SURG ORHT 6 SPLT 77X108 (DRAPES) ×2 IMPLANT
DRSG AQUACEL AG ADV 3.5X14 (GAUZE/BANDAGES/DRESSINGS) ×2 IMPLANT
DURAPREP 26ML APPLICATOR (WOUND CARE) ×4 IMPLANT
ELECT CAUTERY BLADE 6.4 (BLADE) ×4 IMPLANT
ELECT REM PT RETURN 9FT ADLT (ELECTROSURGICAL) ×2
ELECTRODE REM PT RTRN 9FT ADLT (ELECTROSURGICAL) ×1 IMPLANT
EVACUATOR 1/8 PVC DRAIN (DRAIN) IMPLANT
FACESHIELD WRAPAROUND (MASK) ×2 IMPLANT
FEMUR OXINIUM SZ 6 LT (Femur) ×2 IMPLANT
GLOVE BIOGEL PI IND STRL 6.5 (GLOVE) ×1 IMPLANT
GLOVE BIOGEL PI IND STRL 7.5 (GLOVE) ×1 IMPLANT
GLOVE BIOGEL PI INDICATOR 6.5 (GLOVE) ×1
GLOVE BIOGEL PI INDICATOR 7.5 (GLOVE) ×1
GLOVE SURG SYN 7.5  E (GLOVE) ×2
GLOVE SURG SYN 7.5 E (GLOVE) ×2 IMPLANT
GOWN STRL REIN XL XLG (GOWN DISPOSABLE) ×6 IMPLANT
HANDPIECE INTERPULSE COAX TIP (DISPOSABLE) ×2
INSERT TIBIA CONSTRAIN 5-6 9MM (Insert) ×2 IMPLANT
KIT BASIN OR (CUSTOM PROCEDURE TRAY) ×2 IMPLANT
KIT ROOM TURNOVER OR (KITS) ×2 IMPLANT
KIT STIMULAN RAPID CURE  10CC (Orthopedic Implant) ×1 IMPLANT
KIT STIMULAN RAPID CURE 10CC (Orthopedic Implant) ×1 IMPLANT
MANIFOLD NEPTUNE II (INSTRUMENTS) ×2 IMPLANT
MEDIUM 1/8" PVC DRAIN ×2 IMPLANT
NEEDLE SPNL 18GX3.5 QUINCKE PK (NEEDLE) ×2 IMPLANT
NS IRRIG 1000ML POUR BTL (IV SOLUTION) ×2 IMPLANT
PACK TOTAL JOINT (CUSTOM PROCEDURE TRAY) ×2 IMPLANT
PAD ARMBOARD 7.5X6 YLW CONV (MISCELLANEOUS) ×4 IMPLANT
PATELLA 29MM (Knees) ×2 IMPLANT
PEN SKIN MARKING BROAD (MISCELLANEOUS) ×2 IMPLANT
SAW OSC TIP CART 19.5X105X1.3 (SAW) ×2 IMPLANT
SEALER BIPOLAR AQUA 6.0 (INSTRUMENTS) IMPLANT
SET HNDPC FAN SPRY TIP SCT (DISPOSABLE) ×2 IMPLANT
SPONGE LAP 18X18 X RAY DECT (DISPOSABLE) ×8 IMPLANT
STAPLER VISISTAT (STAPLE) ×2 IMPLANT
STAPLER VISISTAT 35W (STAPLE) IMPLANT
STEM STRAIGHT 12X160MM (Stem) ×2 IMPLANT
STEM STRAIGHT 16MM (Stem) ×2 IMPLANT
SUCTION FRAZIER HANDLE 10FR (MISCELLANEOUS)
SUCTION TUBE FRAZIER 10FR DISP (MISCELLANEOUS) IMPLANT
SUT ETHILON 2 0 FS 18 (SUTURE) ×4 IMPLANT
SUT MNCRL AB 4-0 PS2 18 (SUTURE) ×2 IMPLANT
SUT MON AB 2-0 CT1 36 (SUTURE) ×6 IMPLANT
SUT PDS AB 1 CTX 36 (SUTURE) ×6 IMPLANT
SUT VIC AB 0 CT1 27 (SUTURE) ×2
SUT VIC AB 0 CT1 27XBRD ANBCTR (SUTURE) ×2 IMPLANT
SUT VIC AB 1 CTX 18 (SUTURE) ×4 IMPLANT
SUT VIC AB 1 CTX 27 (SUTURE) ×2 IMPLANT
SUT VIC AB 2-0 CT1 27 (SUTURE) ×3
SUT VIC AB 2-0 CT1 TAPERPNT 27 (SUTURE) ×3 IMPLANT
SYR 20CC LL (SYRINGE) ×2 IMPLANT
SYR 50ML LL SCALE MARK (SYRINGE) ×2 IMPLANT
TIBIA SZ 5 LEFT (Knees) ×2 IMPLANT
TOWEL OR 17X24 6PK STRL BLUE (TOWEL DISPOSABLE) ×2 IMPLANT
TOWEL OR 17X26 10 PK STRL BLUE (TOWEL DISPOSABLE) ×2 IMPLANT
WATER STERILE IRR 1000ML POUR (IV SOLUTION) ×4 IMPLANT
WEDGE FEMORAL 6.5MM 15DEG (Screw) ×2 IMPLANT
WRAP KNEE MAXI GEL POST OP (GAUZE/BANDAGES/DRESSINGS) ×2 IMPLANT
YANKAUER SUCT BULB TIP NO VENT (SUCTIONS) ×4 IMPLANT

## 2015-11-07 NOTE — Progress Notes (Signed)
Dr. Royce Macadamia at bedside. labwork reviewed. Lab not processed CBC yet. Pending.first unit of FFP initiated. Minimal bleeding from Hemovac. VVS

## 2015-11-07 NOTE — Op Note (Addendum)
Total Knee Arthroplasty Procedure Note KAMIRA ROBBINS [id number] 11/07/2015   Preoperative diagnosis: Left knee osteoarthritis  Postoperative diagnosis:same  Operative procedure: Left total knee arthroplasty. CPT 27447 modifier 22.  The procedure was made difficult by the extensive amount of synovitis, significant bone loss from the femur and the tibia, soft tissue contractures.  Surgeon: N. Eduard Roux, MD  Assistants: April Green, RNFA  Anesthesia: general, regional  Tourniquet time: 2.5 hrs  Implants used: Smith and Nephew Femur: Legion revision, 16 x 160 stem, Size 6, 6 mm offset Tibia: Genesis II revision, 12 x 160 stem, size 5, 6 mm offset Patella: 29 mm, 7.5 mm thick Polyethylene: 9 mm, constrained  Indication: Brandy Houston is a 60 y.o. year old female with a history of knee pain. Having failed conservative management, the patient elected to proceed with a total knee arthroplasty.  We have reviewed the risk and benefits of the surgery and they elected to proceed after voicing understanding.  Procedure:  After informed consent was obtained and understanding of the risk were voiced including but not limited to bleeding, infection, damage to surrounding structures including nerves and vessels, blood clots, leg length inequality and the failure to achieve desired results, the operative extremity was marked with verbal confirmation of the patient in the holding area.   The patient was then brought to the operating room and transported to the operating room table in the supine position.  A tourniquet was applied to the operative extremity around the upper thigh. The operative limb was then prepped and draped in the usual sterile fashion and preoperative antibiotics were administered.  A time out was performed prior to the start of surgery confirming the correct extremity, preoperative antibiotic administration, as well as team members, implants and instruments available for the  case. Correct surgical site was also confirmed with preoperative radiographs. The limb was then elevated for exsanguination and the tourniquet was inflated. A midline incision was made and a standard medial parapatellar approach was performed.  Patient had an extensive amount of synovitis within the knee joint. She was very hyperemic. Intraoperative synovial samples were sent down to pathology for stat frozen's and were confirmed to be negative for acute inflammation or infection. The decision was made to proceed with the surgery. Antibiotics were given after confirmation of frozen sections.  We first debrided the extensive synovium in order to gain visualization. We then were able to visualize the knee joint. There was essentially complete bone loss of the lateral femoral condyle and the tibial plateau. Her cruciate ligaments were missing. Her collateral ligaments were intact. We then drilled the start site for the femur. I made a a 10 mm distal femoral cut for the medial femoral condyle and a 15 mm distal femoral cut for the lateral femoral condyle. Once this was done we then found the appropriate rotation of the femoral implant and the size of the implant. This was sized to a size 5. Given the lack of the lateral femoral condyle the epicondylar axis was difficult to judge.  We used a common patient of white side line and gap balancing in order to judge our femoral rotation. The rest of the femur was prepared using the 4-in-1 cutting block.  We reamed the femoral canal up to a 16 mm stem giving adequate chatter. We use a long stem 160 mm component. A 10 mm distal femoral augment and a 5 mm posterior femoral augment was used for the lateral femoral condyle. Once we finished  the femur we then turned our attention to the tibia. Exposure was obtained. The intramedullary canal was opened up with a reamer. We then made a 5 mm cut off the tibial cutting guide. We also used a 12 mm x 160 mm stem. We then balanced our  flexion and extension gaps. The tibia was then sized to a 5. Trial components were placed and a 9 mm trial polyethylene liner was used. We use a 29 mm 7.5 mm thick patellar component. Patellar tracking was normal and did not require a lateral release.  The knee was stable in all ranges of motion. We then marked our rotation for the tibia. The rest of the tibia was then finished with the punch. The trial components were removed. The joint was thoroughly irrigated. An x-ray also solution was placed in the soft tissues. The bony surfaces were then dried. The tibia was first cemented with antibiotic impregnated cement. We then placed the femoral component and this was cemented also. A 9 mm trial polyethylene liner was placed and the knee was brought into extension to pressurize the cement. We then placed the final patellar component. The cement was then allowed to cure. Once the cement was hardened we took out the trial polyethylene liner in place a real 9 mm constrained polyethylene liner. This gave excellent stability in all ranges of motion. The tourniquet was then deflated. Dilute Betadine solution was placed in the wound for 3 minutes and then irrigated out. A medium Hemovac drain was placed 10 mL of vancomycin antibiotic beads were placed in the joint.  Hemostasis was obtained. The arthrotomy was closed with #1 PDS. Subcutaneous layer was closed with 2-0 Monocryl. Skin was closed with staples. Sterile dressings were applied. Patient tolerated procedure well and no immediate complications.  Position: supine  Complications: none.  Time Out: performed   Drains/Packing: 1 Hemovac  Estimated blood loss: See anesthesia record  Returned to Recovery Room: in good condition.   Antibiotics: yes   Mechanical VTE (DVT) Prophylaxis: sequential compression devices, TED thigh-high  Chemical VTE (DVT) Prophylaxis: Lovenox  Fluid Replacement  Crystalloid: see anesthesia record Blood: none  FFP: none    Specimens Removed: 1 to pathology   Sponge and Instrument Count Correct? yes   PACU: portable radiograph - knee AP and Lateral   Admission: inpatient status, start PT & OT POD#1  Plan/RTC: Return in 2 weeks for wound check.   Weight Bearing/Load Lower Extremity: full   N. Eduard Roux, MD [place] 2:04 PM

## 2015-11-07 NOTE — Brief Op Note (Signed)
   Brief Op Note  Date of Surgery: 11/07/2015  Preoperative Diagnosis: left knee osteoarthritis  Postoperative Diagnosis: same  Procedure: Procedure(s): LEFT TOTAL KNEE ARTHROPLASTY WITH REVISION OF IMPLANTS  Implants: Tamala Julian and Nephew  Surgeons: Surgeon(s): Naiping Ephriam Jenkins, MD  Anesthesia: General  Drains: 1 HVAC  Estimated Blood Loss: See anesthesia record  Complications: None  Condition to PACU: Stable  Naiping Eduard Roux, MD [place] 11/07/2015 1:58 PM

## 2015-11-07 NOTE — H&P (Deleted)
  Review of Systems  Constitutional: Negative.   Eyes: Negative.   Respiratory: Negative.   Cardiovascular: Negative.   Gastrointestinal: Negative.   Genitourinary: Negative.   Musculoskeletal: Positive for joint pain.  Skin: Negative.   Neurological: Negative.   Psychiatric/Behavioral: Negative.

## 2015-11-07 NOTE — Consult Note (Signed)
Medical Consultation   Brandy Houston  [id number]  DOB: 02-Aug-1955  DOA: 11/07/2015  PCP: Lucrezia Starch, MD  Requesting physician:  Frankey Shown, MD  Reason for consultation: anemia, intraoperative blood loss  History of Present Illness: Brandy Houston is an 60 y.o. female with a past medical history significant for but HTN, DM, recently treated HCV. Patient has chronic kidney disease. She required hemodialysis last year for deterioration in renal function associated with septic arthritis. Patient has no complaints at present. She is still a little drowsy from surgery.   Patient admitted to [facility] today for left total knee replacement. Intraoperatively she lost a significant amount of blood. Work up for DIC ordered by primary team. No prior history of clotting problems per patient.    Review of Systems:  Review of Systems  Constitutional: Negative.   Eyes: Negative.   Respiratory: Negative.   Cardiovascular: Negative.   Gastrointestinal: Negative.   Genitourinary: Negative.   Musculoskeletal: Positive for joint pain.  Skin: Negative.   Neurological: Negative.   Endo/Heme/Allergies: Negative.   Psychiatric/Behavioral: Negative.     Past Medical History: Past Medical History  Diagnosis Date  . Hypertension   . Peripheral neuropathy ([place])   . Hepatitis C 2016  . Diabetes mellitus     type 2, IDDM  . Depression   . Anxiety   . GERD (gastroesophageal reflux disease)   . Septic arthritis ([place])   . Dysrhythmia     tachycardia, normal ECHO 08-09-14  . Anemia   . Chronic kidney disease     stage IV. previous HD, none currently 10/30/15    Past Surgical History: Past Surgical History  Procedure Laterality Date  . Cholecystectomy    . Appendectomy    . Small intestine surgery      Due to Small Bowel Obstruction  . Tubal ligation    . Knee arthroscopy Right 08/10/2014    Procedure: ARTHROSCOPY I & D KNEE;  Surgeon: Marianna Payment, MD;  Location: [facility]  ORS;  Service: Orthopedics;  Laterality: Right;  . Shoulder arthroscopy Bilateral 08/10/2014    Procedure: I & D BILATERAL SHOULDERS ;  Surgeon: Marianna Payment, MD;  Location: [facility] ORS;  Service: Orthopedics;  Laterality: Bilateral;  . Tenosynovectomy Right 08/11/2014    Procedure: RIGHT WRIST IRRIGATION AND DEBRIDEMENT, TENOSYNOVECTOMY;  Surgeon: Marianna Payment, MD;  Location: [place];  Service: Orthopedics;  Laterality: Right;  . Knee arthroscopy Left 08/11/2014    Procedure: ARTHROSCOPIC WASHOUT LEFT KNEE;  Surgeon: Marianna Payment, MD;  Location: [place];  Service: Orthopedics;  Laterality: Left;  . Tee without cardioversion N/A 08/14/2014    Procedure: TRANSESOPHAGEAL ECHOCARDIOGRAM (TEE);  Surgeon: Thayer Headings, MD;  Location: [place];  Service: Cardiovascular;  Laterality: N/A;  . Knee arthroscopy Left 08/19/2014    Procedure: ARTHROSCOPIC WASHOUT LEFT KNEE;  Surgeon: Leandrew Koyanagi, MD;  Location: [place];  Service: Orthopedics;  Laterality: Left;  . Av fistula placement Left 09/13/2014    Procedure: Brachial Artery to Brachial Vein Gortex Four - Seven Stretch GRAFT INSERTION Left Forearm;  Surgeon: Mal Misty, MD;  Location: [place];  Service: Vascular;  Laterality: Left;  . Knee arthroscopy with medial menisectomy Left 04/04/2015    Procedure: LEFT KNEE ARTHROSCOPY WITH PARTIAL MEDIAL MENISCECTOMY  AND SYNOVECTOMY;  Surgeon: Leandrew Koyanagi, MD;  Location: [place];  Service: Orthopedics;  Laterality:  Left;  . Knee arthroscopy with lateral menisectomy Left 04/04/2015    Procedure: AND PARTIAL LATERAL MENISECTOMY;  Surgeon: Leandrew Koyanagi, MD;  Location: [place];  Service: Orthopedics;  Laterality: Left;    Allergies:   Allergies  Allergen Reactions  . Compazine [Prochlorperazine] Shortness Of Breath and Swelling    TONGUE SWELLS  . Shellfish-Derived Products Anaphylaxis  . Iodinated Diagnostic Agents Hives and Rash  . Omnipaque [Iohexol] Hives    Social History:  reports that she has been smoking Cigarettes.  She has a 10 pack-year smoking history. She has never used smokeless tobacco. She reports that she does not drink alcohol or use illicit drugs.  Family History: Family History  Problem Relation Age of Onset  . Cancer Mother   . Heart disease Father   . Cancer Sister   . Cancer Brother   . Diabetes Brother     Physical Exam: Filed Vitals:   11/07/15 1515 11/07/15 1530 11/07/15 1545 11/07/15 1600  BP: 102/43  117/55   Pulse: 63 63 63 64  Temp: 98.1 F (36.7 C)     TempSrc: Oral     Resp: 14 13 13 11   Height:      Weight:      SpO2: 100% 100% 100% 100%    Constitutional: black female resting in PACU. Easily awoken. In NAD Eyes: PER,  irises appear normal, anicteric sclera,  ENMT: external ears and nose appear normal, normal hearing or hard of hearing. Lips appears normal,  Tongue moist, poor dentition.   Neck: neck appears normal, no masses, normal ROM, CVS: S1-S2 clear, no murmur rubs or gallops, no LE edema, normal pedal pulses  Respiratory:  clear to auscultation bilaterally, no wheezing, rales or rhonchi. Respiratory effort normal. No accessory muscle use.  Abdomen: soft nontender, nondistended, normal bowel sounds, no hepatomegaly Musculoskeletal: : no cyanosis, clubbing or edema noted bilaterally. Brace, dressing and wound vac to left knee.  Neuro: Cranial nerves II-XII intact, strength, sensation Psych: judgement and insight appear normal, stable mood and affect, mental status Skin: no rashes or lesions or ulcers,   Data reviewed:  I have personally reviewed following labs and imaging studies  Labs:  CBC:  Recent Labs Lab 11/01/15 1357 11/07/15 0924 11/07/15 0930 11/07/15 1440  WBC  --  6.8  --  8.0  HGB 9.0* 10.4* 12.6 8.3*  HCT  --  33.7* 37.0 24.9*  MCV  --  94.4  --  88.6  PLT  --  269  --  154  [id number]    Basic Metabolic Panel:  Recent Labs Lab 11/07/15 0930  NA 140  K 3.7   GLUCOSE 146*   GFR Estimated Creatinine Clearance: 27.1 mL/min (by C-G formula based on Cr of 2.12).   Coagulation profile  Recent Labs Lab 11/07/15 1440  INR 1.51*   CBG:  Recent Labs Lab 11/07/15 1425  GLUCAP 146*   D-Dimer  Recent Labs  11/07/15 1440  DDIMER 7.80*   Urinalysis    Component Value Date/Time   COLORURINE YELLOW 10/29/2015 1425   APPEARANCEUR CLEAR 10/29/2015 1425   LABSPEC 1.017 10/29/2015 1425   PHURINE 5.5 10/29/2015 1425   GLUCOSEU NEGATIVE 10/29/2015 1425   HGBUR NEGATIVE 10/29/2015 [place] 10/29/2015 1425   KETONESUR NEGATIVE 10/29/2015 1425   PROTEINUR 100* 10/29/2015 1425   UROBILINOGEN 0.2 09/02/2014 1504   NITRITE NEGATIVE 10/29/2015 1425   LEUKOCYTESUR SMALL* 10/29/2015 1425    Inpatient Medications:  Scheduled Meds: . sodium chloride   Intravenous Once  . chlorhexidine  60 mL Topical Once  . chlorhexidine  60 mL Topical Once  . fentaNYL (SUBLIMAZE) injection  100 mcg Intravenous Once  . midazolam  2 mg Intravenous Once  . tranexamic acid (CYKLOKAPRON) topical -INTRAOP  2,000 mg Topical To OR  . tranexamic acid (CYKLOKAPRON) topical -INTRAOP  2,000 mg Topical To OR  . vancomycin (VANCOCIN) 1000 mg IVPB  1,000 mg Intravenous Q12H   Continuous Infusions: . sodium chloride 10 mL/hr at 11/07/15 1021  . sodium chloride     Radiological Exams on Admission: Dg Knee Left Port  11/07/2015  CLINICAL DATA:  Total knee replacement. EXAM: PORTABLE LEFT KNEE - 1-2 VIEW COMPARISON:  10/12/2015 FINDINGS: Total knee arthroplasty without periprosthetic fracture or subluxation. Antibiotic beads noted mainly in the suprapatellar joint space neighboring a drain. IMPRESSION: Total knee arthroplasty without acute finding. Electronically Signed   By: Monte Fantasia M.D.   On: 11/07/2015 14:42    Impression/Recommendations  Normocytic anemia , post op following left total knee replacement today. Patient had unusual /  unexpected amount of oozing totaling approx 2300 cc of blood. Pre-op hgb 12.6. Received 4 units of blood and post transfusion hgb 8.3. No known history of clotting disorder. Receiving 1 of 2 units of FFP  Normal fibrinogen, INR mildly elevated at 1.5. D- dimer elevated not unexpected due to surgery. No schistocytes on smear. No significant bleeding in wound vac. BP on low side post-op but vitals otherwise stable. Patient received around 2 liters of fluid plus 3 units of blood in OR. -agree with stepdown monitoring overnight.  -trend CBC Q6 -repeat coags and d-dimer in am (prior to lovenox)  Diabetes, presumably type 2. Home insulin already resumed by primary team.   CKD, stage IV. Previous HD during episode of septic arthritis a year ago. Cr 2.12, better 3.3 in January.    Thank you for this consultation.  Our [facility] hospitalist team will follow the patient with you.   Time Spent: 64 minutes  Tye Savoy M.D. [facility] 11/07/2015, 4:08 PM

## 2015-11-07 NOTE — Progress Notes (Signed)
Utilization review completed.  

## 2015-11-07 NOTE — Anesthesia Preprocedure Evaluation (Addendum)
Anesthesia Evaluation  Patient identified by MRN, date of birth, ID band Patient awake    Reviewed: Allergy & Precautions, NPO status , Patient's Chart, lab work & pertinent test results, reviewed documented beta blocker date and time   Airway Mallampati: II  TM Distance: >3 FB Neck ROM: Full    Dental  (+) Chipped, Poor Dentition, Missing,    Pulmonary shortness of breath and with exertion, Current Smoker,    Pulmonary exam normal breath sounds clear to auscultation       Cardiovascular hypertension, Pt. on medications and Pt. on home beta blockers Normal cardiovascular exam+ dysrhythmias  Rhythm:Regular Rate:Normal  Nuclear stress test- no evidence of inducible ischemia LVEF 50%   Neuro/Psych PSYCHIATRIC DISORDERS Anxiety Depression Diabetic peripheral neuropathy  Neuromuscular disease    GI/Hepatic GERD  Medicated and Controlled,(+) Hepatitis -, C  Endo/Other  diabetes, Poorly Controlled, Type 2, Insulin Dependent  Renal/GU Renal InsufficiencyRenal disease  negative genitourinary   Musculoskeletal  (+) Arthritis , Osteoarthritis,  OA Left Knee   Abdominal   Peds  Hematology  (+) anemia ,   Anesthesia Other Findings   Reproductive/Obstetrics                            Anesthesia Physical Anesthesia Plan  ASA: III  Anesthesia Plan: General and Regional   Post-op Pain Management: GA combined w/ Regional for post-op pain   Induction: Intravenous and Cricoid pressure planned  Airway Management Planned: Oral ETT  Additional Equipment:   Intra-op Plan:   Post-operative Plan: Extubation in OR  Informed Consent: I have reviewed the patients History and Physical, chart, labs and discussed the procedure including the risks, benefits and alternatives for the proposed anesthesia with the patient or authorized representative who has indicated his/her understanding and acceptance.   Dental  advisory given  Plan Discussed with: Anesthesiologist, CRNA and Surgeon  Anesthesia Plan Comments:        Anesthesia Quick Evaluation

## 2015-11-07 NOTE — Anesthesia Procedure Notes (Addendum)
Anesthesia Regional Block:  Adductor canal block  Pre-Anesthetic Checklist: ,, timeout performed, Correct Patient, Correct Site, Correct Laterality, Correct Procedure, Correct Position, site marked, Risks and benefits discussed,  Surgical consent,  Pre-op evaluation,  At surgeon's request and post-op pain management  Laterality: Left and Lower  Prep: chloraprep       Needles:   Needle Type: Echogenic Stimulator Needle     Needle Length: 9cm 9 cm Needle Gauge: 21 and 21 G    Additional Needles:  Procedures: ultrasound guided (picture in chart) Adductor canal block Narrative:  Injection made incrementally with aspirations every 5 mL.  Performed by: Personally  Anesthesiologist: Josephine Igo  Additional Notes: Patient tolerated procedure well.    Procedure Name: Intubation Date/Time: 11/07/2015 10:37 AM Performed by: Lind Covert Pre-anesthesia Checklist: Patient identified, Emergency Drugs available, Suction available, Patient being monitored and Timeout performed Patient Re-evaluated:Patient Re-evaluated prior to inductionOxygen Delivery Method: Circle system utilized Preoxygenation: Pre-oxygenation with 100% oxygen Intubation Type: IV induction Ventilation: Mask ventilation without difficulty Laryngoscope Size: Mac and 4 Grade View: Grade I Tube type: Oral Tube size: 7.0 mm Number of attempts: 1 Airway Equipment and Method: Stylet Placement Confirmation: ETT inserted through vocal cords under direct vision,  breath sounds checked- equal and bilateral and positive ETCO2 Secured at: 21 cm Tube secured with: Tape Dental Injury: Teeth and Oropharynx as per pre-operative assessment

## 2015-11-07 NOTE — H&P (Signed)
PREOPERATIVE H&P  Chief Complaint: left knee osteoarthritis  HPI: Brandy Houston is a 60 y.o. female who presents for surgical treatment of left knee osteoarthritis.  She denies any changes in medical history.  Past Medical History  Diagnosis Date  . Hypertension   . Peripheral neuropathy ([place])   . Hepatitis C 2016  . Diabetes mellitus     type 2, IDDM  . Depression   . Anxiety   . GERD (gastroesophageal reflux disease)   . Septic arthritis ([place])   . Dysrhythmia     tachycardia, normal ECHO 08-09-14  . Anemia   . Chronic kidney disease     stage IV. previous HD, none currently 10/30/15   Past Surgical History  Procedure Laterality Date  . Cholecystectomy    . Appendectomy    . Small intestine surgery      Due to Small Bowel Obstruction  . Tubal ligation    . Knee arthroscopy Right 08/10/2014    Procedure: ARTHROSCOPY I & D KNEE;  Surgeon: Marianna Payment, MD;  Location: [facility];  Service: Orthopedics;  Laterality: Right;  . Shoulder arthroscopy Bilateral 08/10/2014    Procedure: I & D BILATERAL SHOULDERS ;  Surgeon: Marianna Payment, MD;  Location: [facility];  Service: Orthopedics;  Laterality: Bilateral;  . Tenosynovectomy Right 08/11/2014    Procedure: RIGHT WRIST IRRIGATION AND DEBRIDEMENT, TENOSYNOVECTOMY;  Surgeon: Marianna Payment, MD;  Location: [facility];  Service: Orthopedics;  Laterality: Right;  . Knee arthroscopy Left 08/11/2014    Procedure: ARTHROSCOPIC WASHOUT LEFT KNEE;  Surgeon: Marianna Payment, MD;  Location: [facility];  Service: Orthopedics;  Laterality: Left;  . Tee without cardioversion N/A 08/14/2014    Procedure: TRANSESOPHAGEAL ECHOCARDIOGRAM (TEE);  Surgeon: Thayer Headings, MD;  Location: [facility];  Service: Cardiovascular;  Laterality: N/A;  . Knee arthroscopy Left 08/19/2014    Procedure: ARTHROSCOPIC WASHOUT LEFT KNEE;  Surgeon: Leandrew Koyanagi, MD;  Location: [facility];  Service: Orthopedics;  Laterality: Left;  . Av fistula placement Left 09/13/2014   Procedure: Brachial Artery to Brachial Vein Gortex Four - Seven Stretch GRAFT INSERTION Left Forearm;  Surgeon: Mal Misty, MD;  Location: [facility];  Service: Vascular;  Laterality: Left;  . Knee arthroscopy with medial menisectomy Left 04/04/2015    Procedure: LEFT KNEE ARTHROSCOPY WITH PARTIAL MEDIAL MENISCECTOMY  AND SYNOVECTOMY;  Surgeon: Leandrew Koyanagi, MD;  Location: [facility];  Service: Orthopedics;  Laterality: Left;  . Knee arthroscopy with lateral menisectomy Left 04/04/2015    Procedure: AND PARTIAL LATERAL MENISECTOMY;  Surgeon: Leandrew Koyanagi, MD;  Location: [facility];  Service: Orthopedics;  Laterality: Left;   Social History   Social History  . Marital Status: Single    Spouse Name: N/A  . Number of Children: N/A  . Years of Education: N/A   Social History Main Topics  . Smoking status: Current Every Day Smoker -- 0.50 packs/day for 20 years    Types: Cigarettes  . Smokeless tobacco: Never Used     Comment: Smoking 7-8 cigs per day  . Alcohol Use: No     Comment: per pt no alcohol since 09/2014  . Drug Use: No  . Sexual Activity: No   Other Topics Concern  . Not on file   Social History Narrative   Family History  Problem Relation Age of Onset  . Cancer Mother   . Heart disease Father   . Cancer Sister   .  Cancer Brother   . Diabetes Brother    Allergies  Allergen Reactions  . Compazine [Prochlorperazine] Shortness Of Breath and Swelling    TONGUE SWELLS  . Shellfish-Derived Products Anaphylaxis  . Iodinated Diagnostic Agents Hives and Rash  . Omnipaque [Iohexol] Hives   Prior to Admission medications   Medication Sig Start Date End Date Taking? Authorizing Provider  acetaminophen (TYLENOL) 500 MG tablet Take 1,000 mg by mouth every 6 (six) hours as needed for mild pain.   Yes Historical Provider, MD  aspirin 81 MG chewable tablet Chew 81 mg by mouth daily.    Yes Historical Provider, MD  calcium carbonate (TUMS E-X 750) 750  MG chewable tablet Chew 1 tablet (750 mg total) by mouth daily. With your largest meal Patient taking differently: Chew 1 tablet by mouth 3 (three) times daily before meals.  09/29/14  Yes Ivan Anchors Love, PA-C  diphenhydrAMINE (BENADRYL) 25 mg capsule Take 1 capsule (25 mg total) by mouth every 6 (six) hours as needed for itching. 09/28/14  Yes Ivan Anchors Love, PA-C  furosemide (LASIX) 80 MG tablet Take 160 mg by mouth 3 (three) times daily.    Yes Historical Provider, MD  gabapentin (NEURONTIN) 100 MG capsule Take 1 capsule (100 mg total) by mouth 2 (two) times daily. 12/06/14  Yes Lance Bosch, NP  hydrALAZINE (APRESOLINE) 100 MG tablet Take 1 tablet by mouth 2 (two) times daily. 10/17/15  Yes Historical Provider, MD  Insulin Glargine (LANTUS SOLOSTAR) 100 UNIT/ML Solostar Pen Inject 5 Units into the skin daily at 10 pm. Patient taking differently: Inject 15 Units into the skin daily at 10 pm.  11/14/14  Yes Lance Bosch, NP  metoprolol succinate (TOPROL-XL) 50 MG 24 hr tablet Take 1 tablet by mouth daily. 10/17/15  Yes Historical Provider, MD  Multiple Vitamin (MULTIVITAMIN WITH MINERALS) TABS tablet Take 1 tablet by mouth daily.   Yes Historical Provider, MD  multivitamin (RENA-VIT) TABS tablet Take 1 tablet by mouth at bedtime. 09/28/14  Yes Ivan Anchors Love, PA-C  omeprazole (PRILOSEC) 20 MG capsule Take 1 capsule by mouth every morning.  10/04/15  Yes Historical Provider, MD  Potassium Chloride ER 20 MEQ TBCR Take 1 tablet by mouth daily. 10/04/15  Yes Historical Provider, MD  sertraline (ZOLOFT) 100 MG tablet Take 1 tablet (100 mg total) by mouth daily. 03/15/15  Yes Carlyle Basques, MD  traMADol (ULTRAM) 50 MG tablet Take 50 mg by mouth every 6 (six) hours as needed for moderate pain.    Yes Historical Provider, MD  Blood Glucose Monitoring Suppl (ACCU-CHEK AVIVA PLUS) W/DEVICE KIT Check sugars TID for E11.65 01/29/15   Lance Bosch, NP  glucose blood (ACCU-CHEK AVIVA) test strip Check sugars TID for  E11.65 01/29/15   Lance Bosch, NP  Insulin Syringe-Needle U-100 (BD INSULIN SYRINGE ULTRAFINE) 31G X 5/16" 0.5 ML MISC Inject insulin once per night for E11.65 01/29/15   Lance Bosch, NP  Lancet Devices [facility]) lancets Check sugars TID for E11.65 01/29/15   Lance Bosch, NP     Positive ROS: All other systems have been reviewed and were otherwise negative with the exception of those mentioned in the HPI and as above.  Physical Exam: General: Alert, no acute distress Cardiovascular: No pedal edema Respiratory: No cyanosis, no use of accessory musculature GI: abdomen soft Skin: No lesions in the area of chief complaint Neurologic: Sensation intact distally Psychiatric: Patient is competent for consent with normal mood and  affect Lymphatic: no lymphedema  MUSCULOSKELETAL: exam stable  Assessment: left knee osteoarthritis  Plan: Plan for Procedure(s): LEFT TOTAL KNEE ARTHROPLASTY  The risks benefits and alternatives were discussed with the patient including but not limited to the risks of nonoperative treatment, versus surgical intervention including infection, bleeding, nerve injury,  blood clots, cardiopulmonary complications, morbidity, mortality, among others, and they were willing to proceed.   Marianna Payment, MD   11/07/2015 8:42 AM

## 2015-11-07 NOTE — Anesthesia Postprocedure Evaluation (Signed)
Anesthesia Post Note  Patient: RHETA DADE  Procedure(s) Performed: Procedure(s) (LRB): LEFT TOTAL KNEE ARTHROPLASTY WITH REVISION OF IMPLANTS (Left)  Patient location during evaluation: PACU Anesthesia Type: General and Regional Level of consciousness: awake and alert and oriented Pain management: pain level controlled Vital Signs Assessment: post-procedure vital signs reviewed and stable Respiratory status: spontaneous breathing, nonlabored ventilation, respiratory function stable and patient connected to nasal cannula oxygen Cardiovascular status: blood pressure returned to baseline and stable Postop Assessment: no signs of nausea or vomiting Anesthetic complications: no               Raylan Hanton A.

## 2015-11-07 NOTE — Discharge Instructions (Signed)
° °INSTRUCTIONS AFTER JOINT REPLACEMENT  ° °o Remove items at home which could result in a fall. This includes throw rugs or furniture in walking pathways °o ICE to the affected joint every three hours while awake for 30 minutes at a time, for at least the first 3-5 days, and then as needed for pain and swelling.  Continue to use ice for pain and swelling. You may notice swelling that will progress down to the foot and ankle.  This is normal after surgery.  Elevate your leg when you are not up walking on it.   °o Continue to use the breathing machine you got in the hospital (incentive spirometer) which will help keep your temperature down.  It is common for your temperature to cycle up and down following surgery, especially at night when you are not up moving around and exerting yourself.  The breathing machine keeps your lungs expanded and your temperature down. ° ° °DIET:  As you were doing prior to hospitalization, we recommend a well-balanced diet. ° °DRESSING / WOUND CARE / SHOWERING ° °You may change your surgical dressing 7 days after surgery.  Then change the dressing every day with sterile gauze.  Please use good hand washing techniques before changing the dressing.  Do not use any lotions or creams on the incision until instructed by your surgeon.  You may shower while you have the surgical dressing which is waterproof.  After removal of surgical dressing, you must cover the incision when showering. ° °ACTIVITY ° °o Increase activity slowly as tolerated, but follow the weight bearing instructions below.   °o No driving for 6 weeks or until further direction given by your physician.  You cannot drive while taking narcotics.  °o No lifting or carrying greater than 10 lbs. until further directed by your surgeon. °o Avoid periods of inactivity such as sitting longer than an hour when not asleep. This helps prevent blood clots.  °o You may return to work once you are authorized by your doctor.  ° ° ° °WEIGHT  BEARING  ° °Weight bearing as tolerated with assist device (walker, cane, etc) as directed, use it as long as suggested by your surgeon or therapist, typically at least 4-6 weeks. ° ° °EXERCISES ° °Results after joint replacement surgery are often greatly improved when you follow the exercise, range of motion and muscle strengthening exercises prescribed by your doctor. Safety measures are also important to protect the joint from further injury. Any time any of these exercises cause you to have increased pain or swelling, decrease what you are doing until you are comfortable again and then slowly increase them. If you have problems or questions, call your caregiver or physical therapist for advice.  ° °Rehabilitation is important following a joint replacement. After just a few days of immobilization, the muscles of the leg can become weakened and shrink (atrophy).  These exercises are designed to build up the tone and strength of the thigh and leg muscles and to improve motion. Often times heat used for twenty to thirty minutes before working out will loosen up your tissues and help with improving the range of motion but do not use heat for the first two weeks following surgery (sometimes heat can increase post-operative swelling).  ° °These exercises can be done on a training (exercise) mat, on the floor, on a table or on a bed. Use whatever works the best and is most comfortable for you.    Use music or television   while you are exercising so that the exercises are a pleasant break in your day. This will make your life better with the exercises acting as a break in your routine that you can look forward to.   Perform all exercises about fifteen times, three times per day or as directed.  You should exercise both the operative leg and the other leg as well. ° °Exercises include: °  °• Quad Sets - Tighten up the muscle on the front of the thigh (Quad) and hold for 5-10 seconds.   °• Straight Leg Raises - With your  knee straight (if you were given a brace, keep it on), lift the leg to 60 degrees, hold for 3 seconds, and slowly lower the leg.  Perform this exercise against resistance later as your leg gets stronger.  °• Leg Slides: Lying on your back, slowly slide your foot toward your buttocks, bending your knee up off the floor (only go as far as is comfortable). Then slowly slide your foot back down until your leg is flat on the floor again.  °• Angel Wings: Lying on your back spread your legs to the side as far apart as you can without causing discomfort.  °• Hamstring Strength:  Lying on your back, push your heel against the floor with your leg straight by tightening up the muscles of your buttocks.  Repeat, but this time bend your knee to a comfortable angle, and push your heel against the floor.  You may put a pillow under the heel to make it more comfortable if necessary.  ° °A rehabilitation program following joint replacement surgery can speed recovery and prevent re-injury in the future due to weakened muscles. Contact your doctor or a physical therapist for more information on knee rehabilitation.  ° ° °CONSTIPATION ° °Constipation is defined medically as fewer than three stools per week and severe constipation as less than one stool per week.  Even if you have a regular bowel pattern at home, your normal regimen is likely to be disrupted due to multiple reasons following surgery.  Combination of anesthesia, postoperative narcotics, change in appetite and fluid intake all can affect your bowels.  ° °YOU MUST use at least one of the following options; they are listed in order of increasing strength to get the job done.  They are all available over the counter, and you may need to use some, POSSIBLY even all of these options:   ° °Drink plenty of fluids (prune juice may be helpful) and high fiber foods °Colace 100 mg by mouth twice a day  °Senokot for constipation as directed and as needed Dulcolax (bisacodyl), take  with full glass of water  °Miralax (polyethylene glycol) once or twice a day as needed. ° °If you have tried all these things and are unable to have a bowel movement in the first 3-4 days after surgery call either your surgeon or your primary doctor.   ° °If you experience loose stools or diarrhea, hold the medications until you stool forms back up.  If your symptoms do not get better within 1 week or if they get worse, check with your doctor.  If you experience "the worst abdominal pain ever" or develop nausea or vomiting, please contact the office immediately for further recommendations for treatment. ° ° °ITCHING:  If you experience itching with your medications, try taking only a single pain pill, or even half a pain pill at a time.  You can also use Benadryl over the   counter for itching or also to help with sleep.  ° °TED HOSE STOCKINGS:  Use stockings on both legs until for at least 2 weeks or as directed by physician office. They may be removed at night for sleeping. ° °MEDICATIONS:  See your medication summary on the “After Visit Summary” that nursing will review with you.  You may have some home medications which will be placed on hold until you complete the course of blood thinner medication.  It is important for you to complete the blood thinner medication as prescribed. ° °PRECAUTIONS:  If you experience chest pain or shortness of breath - call 911 immediately for transfer to the hospital emergency department.  ° °If you develop a fever greater that 101 F, purulent drainage from wound, increased redness or drainage from wound, foul odor from the wound/dressing, or calf pain - CONTACT YOUR SURGEON.   °                                                °FOLLOW-UP APPOINTMENTS:  If you do not already have a post-op appointment, please call the office for an appointment to be seen by your surgeon.  Guidelines for how soon to be seen are listed in your “After Visit Summary”, but are typically between 1-4 weeks  after surgery. ° °OTHER INSTRUCTIONS:  ° °Knee Replacement:  Do not place pillow under knee, focus on keeping the knee straight while resting. CPM instructions: 0-90 degrees, 2 hours in the morning, 2 hours in the afternoon, and 2 hours in the evening. Place foam block, curve side up under heel at all times except when in CPM or when walking.  DO NOT modify, tear, cut, or change the foam block in any way. ° °MAKE SURE YOU:  °• Understand these instructions.  °• Get help right away if you are not doing well or get worse.  ° ° °Thank you for letting us be a part of your medical care team.  It is a privilege we respect greatly.  We hope these instructions will help you stay on track for a fast and full recovery!  ° ° ° °

## 2015-11-07 NOTE — Transfer of Care (Signed)
Immediate Anesthesia Transfer of Care Note  Patient: Brandy Houston  Procedure(s) Performed: Procedure(s): LEFT TOTAL KNEE ARTHROPLASTY WITH REVISION OF IMPLANTS (Left)  Patient Location: PACU  Anesthesia Type:General  Level of Consciousness: sedated  Airway & Oxygen Therapy: Patient Spontanous Breathing and Patient connected to face mask oxygen  Post-op Assessment: Report given to RN and Post -op Vital signs reviewed and stable  Post vital signs: Reviewed and stable  Last Vitals:  Filed Vitals:   11/07/15 0935  BP: 217/81  Pulse: 64  Temp: 36.4 C  Resp: 20    Last Pain: There were no vitals filed for this visit.    Patients Stated Pain Goal: 1 ([id number])  Complications: No apparent anesthesia complications

## 2015-11-07 NOTE — Progress Notes (Signed)
Orthopedic Tech Progress Note Patient Details:  Brandy Houston [birth year] [id number]  CPM Left Knee CPM Left Knee: On Left Knee Flexion (Degrees): 90 Left Knee Extension (Degrees): 0  Ortho Devices Ortho Device/Splint Location: applied ohf to bed Ortho Device/Splint Interventions: Ordered, Application   [name] 11/07/2015, 5:08 PM

## 2015-11-08 ENCOUNTER — Encounter (HOSPITAL_COMMUNITY): Payer: Self-pay | Admitting: Orthopaedic Surgery

## 2015-11-08 DIAGNOSIS — F329 Major depressive disorder, single episode, unspecified: Secondary | ICD-10-CM

## 2015-11-08 DIAGNOSIS — D62 Acute posthemorrhagic anemia: Secondary | ICD-10-CM | POA: Diagnosis present

## 2015-11-08 DIAGNOSIS — F339 Major depressive disorder, recurrent, unspecified: Secondary | ICD-10-CM | POA: Diagnosis present

## 2015-11-08 DIAGNOSIS — E1142 Type 2 diabetes mellitus with diabetic polyneuropathy: Secondary | ICD-10-CM | POA: Diagnosis present

## 2015-11-08 DIAGNOSIS — Z96659 Presence of unspecified artificial knee joint: Secondary | ICD-10-CM | POA: Diagnosis present

## 2015-11-08 DIAGNOSIS — F411 Generalized anxiety disorder: Secondary | ICD-10-CM

## 2015-11-08 DIAGNOSIS — E1165 Type 2 diabetes mellitus with hyperglycemia: Secondary | ICD-10-CM

## 2015-11-08 DIAGNOSIS — E118 Type 2 diabetes mellitus with unspecified complications: Secondary | ICD-10-CM

## 2015-11-08 DIAGNOSIS — I5032 Chronic diastolic (congestive) heart failure: Secondary | ICD-10-CM | POA: Diagnosis present

## 2015-11-08 LAB — BASIC METABOLIC PANEL
Anion gap: 8 (ref 5–15)
BUN: 44 mg/dL — ABNORMAL HIGH (ref 6–20)
CO2: 30 mmol/L (ref 22–32)
Calcium: 7.6 mg/dL — ABNORMAL LOW (ref 8.9–10.3)
Chloride: 101 mmol/L (ref 101–111)
Creatinine, Ser: 2.52 mg/dL — ABNORMAL HIGH (ref 0.44–1.00)
GFR calc Af Amer: 23 mL/min — ABNORMAL LOW (ref >60)
GFR calc non Af Amer: 19 mL/min — ABNORMAL LOW (ref >60)
Glucose, Bld: 150 mg/dL — ABNORMAL HIGH (ref 65–99)
Potassium: 4.1 mmol/L (ref 3.5–5.1)
Sodium: 139 mmol/L (ref 135–145)

## 2015-11-08 LAB — CBC
HCT: 21.4 % — ABNORMAL LOW (ref 36.0–46.0)
HCT: 20.5 % — ABNORMAL LOW (ref 36.0–46.0)
HCT: 27.2 % — ABNORMAL LOW (ref 36.0–46.0)
Hemoglobin: 7.1 g/dL — ABNORMAL LOW (ref 12.0–15.0)
Hemoglobin: 6.7 g/dL — CL (ref 12.0–15.0)
Hemoglobin: 9 g/dL — ABNORMAL LOW (ref 12.0–15.0)
MCH: 29.3 pg (ref 26.0–34.0)
MCH: 29.8 pg (ref 26.0–34.0)
MCH: 28.4 pg (ref 26.0–34.0)
MCHC: 33.2 g/dL (ref 30.0–36.0)
MCHC: 32.7 g/dL (ref 30.0–36.0)
MCHC: 33.1 g/dL (ref 30.0–36.0)
MCV: 88.4 fL (ref 78.0–100.0)
MCV: 91.1 fL (ref 78.0–100.0)
MCV: 85.8 fL (ref 78.0–100.0)
Platelets: 123 10*3/uL — ABNORMAL LOW (ref 150–400)
Platelets: 129 10*3/uL — ABNORMAL LOW (ref 150–400)
Platelets: 136 10*3/uL — ABNORMAL LOW (ref 150–400)
RBC: 2.42 MIL/uL — ABNORMAL LOW (ref 3.87–5.11)
RBC: 2.25 MIL/uL — ABNORMAL LOW (ref 3.87–5.11)
RBC: 3.17 MIL/uL — ABNORMAL LOW (ref 3.87–5.11)
RDW: 16.7 % — ABNORMAL HIGH (ref 11.5–15.5)
RDW: 17.3 % — ABNORMAL HIGH (ref 11.5–15.5)
RDW: 18.2 % — ABNORMAL HIGH (ref 11.5–15.5)
WBC: 9.1 10*3/uL (ref 4.0–10.5)
WBC: 9.2 10*3/uL (ref 4.0–10.5)
WBC: 10.9 10*3/uL — ABNORMAL HIGH (ref 4.0–10.5)

## 2015-11-08 LAB — PREPARE FRESH FROZEN PLASMA
Unit division: 0
Unit division: 0

## 2015-11-08 LAB — CREATININE, SERUM
Creatinine, Ser: 2.38 mg/dL — ABNORMAL HIGH (ref 0.44–1.00)
GFR calc Af Amer: 24 mL/min — ABNORMAL LOW (ref >60)
GFR calc non Af Amer: 21 mL/min — ABNORMAL LOW (ref >60)

## 2015-11-08 LAB — GLUCOSE, CAPILLARY
Glucose-Capillary: 141 mg/dL — ABNORMAL HIGH (ref 65–99)
Glucose-Capillary: 189 mg/dL — ABNORMAL HIGH (ref 65–99)
Glucose-Capillary: 364 mg/dL — ABNORMAL HIGH (ref 65–99)
Glucose-Capillary: 192 mg/dL — ABNORMAL HIGH (ref 65–99)

## 2015-11-08 LAB — LIPID PANEL
Cholesterol: 97 mg/dL (ref 0–200)
HDL: 29 mg/dL — ABNORMAL LOW (ref >40)
LDL Cholesterol: 51 mg/dL (ref 0–99)
Total CHOL/HDL Ratio: 3.3 RATIO
Triglycerides: 83 mg/dL (ref <150)
VLDL: 17 mg/dL (ref 0–40)

## 2015-11-08 LAB — PREPARE RBC (CROSSMATCH)

## 2015-11-08 LAB — PROTIME-INR
INR: 1.29 (ref 0.00–1.49)
Prothrombin Time: 16.2 seconds — ABNORMAL HIGH (ref 11.6–15.2)

## 2015-11-08 LAB — D-DIMER, QUANTITATIVE: D-Dimer, Quant: 3.22 ug/mL-FEU — ABNORMAL HIGH (ref 0.00–0.50)

## 2015-11-08 MED ORDER — HYDROCODONE-ACETAMINOPHEN 10-325 MG PO TABS
1.0000 | ORAL_TABLET | ORAL | Status: DC | PRN
  Administered 2015-11-08 – 2015-11-12 (×4): 1 via ORAL
  Filled 2015-11-08 (×5): qty 1

## 2015-11-08 MED ORDER — OXYCODONE HCL ER 15 MG PO T12A
15.0000 mg | EXTENDED_RELEASE_TABLET | Freq: Two times a day (BID) | ORAL | Status: DC
  Administered 2015-11-08 – 2015-11-13 (×11): 15 mg via ORAL
  Filled 2015-11-08 (×11): qty 1

## 2015-11-08 MED ORDER — SODIUM CHLORIDE 0.9 % IV SOLN
Freq: Once | INTRAVENOUS | Status: AC
  Administered 2015-11-08: 09:00:00 via INTRAVENOUS

## 2015-11-08 NOTE — Progress Notes (Signed)
Orthopedic Tech Progress Note Patient Details:  Brandy Houston March 16, 1956 [id number]  CPM Left Knee CPM Left Knee: On Left Knee Flexion (Degrees): 90 Left Knee Extension (Degrees): 0   [name] 11/08/2015, 2:29 PM

## 2015-11-08 NOTE — Progress Notes (Signed)
   Subjective:  Patient reports pain as severe.  Objective:   VITALS:   Filed Vitals:   11/07/15 2033 11/07/15 2102 11/07/15 2250 11/08/15 0445  BP:  127/43 138/50 140/54  Pulse:  68 72 81  Temp:  98.6 F (37 C) 98.5 F (36.9 C) 98 F (36.7 C)  TempSrc:  Oral Oral Oral  Resp:   17 0  Height:      Weight:      SpO2: 100% 100% 100% 99%    Neurologically intact Neurovascular intact Sensation intact distally Intact pulses distally Dorsiflexion/Plantar flexion intact Incision: dressing C/D/I and no drainage No cellulitis present Compartment soft   Lab Results  Component Value Date   WBC 9.2 11/08/2015   HGB 6.7* 11/08/2015   HCT 20.5* 11/08/2015   MCV 91.1 11/08/2015   PLT 129* 11/08/2015     Assessment/Plan:  1 Day Post-Op   - Expected postop acute blood loss anemia - will monitor for symptoms - 2 units ordered for transfusion - Up with PT/OT - DVT ppx - SCDs, ambulation, lovenox - WBAT operative extremity - Pain control - drain removed - Discharge planning  Brandy Houston 11/08/2015, 7:40 AM [phone]

## 2015-11-08 NOTE — Progress Notes (Signed)
Orthopedic Tech Progress Note Patient Details:  Brandy Houston 10/04/1955 [id number]  Ortho Devices Type of Ortho Device: Knee Immobilizer Ortho Device/Splint Location: applied ohf to bed Ortho Device/Splint Interventions: Application   [name] 11/08/2015, 2:30 PM

## 2015-11-08 NOTE — Clinical Social Work Note (Signed)
CSW acknowledges SNF consult. Waiting on PT recommendations.  Dayton Scrape, [place]

## 2015-11-08 NOTE — Consult Note (Signed)
Medical Consultation   Brandy Houston  [id number]  DOB: [birth year]  DOA: 11/07/2015  PCP: Lucrezia Starch, MD     Requesting physician: Dr.Naiping Ephriam Jenkins orthopedic surgery   Reason for consultation: Multiple medical problems    History of Present Illness: Brandy Houston is an   60 y.o. BF PMHx Anxiety, Depression, HTN, chronic diastolic CHF, LVH DM type II with complications (Peripheral Neuropathy), Hepatitis C (completed treatment; Dr. Graylon Good ID physician) CKD stage IV, Septic Arthritis, who presents for surgical treatment of left knee osteoarthritis.     Review of Systems:  Review of Systems  Constitutional: Negative for fever, chills, weight loss, malaise/fatigue and diaphoresis.  HENT: Negative for congestion, ear discharge, ear pain, hearing loss, nosebleeds, sore throat and tinnitus.   Eyes: Negative for blurred vision, double vision, photophobia, pain, discharge and redness.  Respiratory: Negative for cough, hemoptysis, sputum production, shortness of breath, wheezing and stridor.   Cardiovascular: Positive for leg swelling. Negative for chest pain, palpitations, orthopnea, claudication and PND.  Gastrointestinal: Negative for heartburn, nausea, vomiting, abdominal pain, diarrhea, constipation, blood in stool and melena.  Genitourinary: Negative for dysuria, urgency, frequency, hematuria and flank pain.  Musculoskeletal: Positive for joint pain. Negative for myalgias, back pain, falls and neck pain.  Skin: Negative for itching and rash.  Neurological: Negative for dizziness, tingling, tremors, sensory change, speech change, focal weakness, seizures, loss of consciousness, weakness and headaches.  Endo/Heme/Allergies: Negative for environmental allergies and polydipsia. Does not bruise/bleed easily.  Psychiatric/Behavioral: Negative for depression, suicidal ideas, hallucinations, memory loss and substance abuse. The patient is not nervous/anxious and does  not have insomnia.       Past Medical History: Past Medical History  Diagnosis Date  . Hypertension   . Peripheral neuropathy ([place])   . Hepatitis C 2016  . Diabetes mellitus     type 2, IDDM  . Depression   . Anxiety   . GERD (gastroesophageal reflux disease)   . Septic arthritis ([place])   . Dysrhythmia     tachycardia, normal ECHO 08-09-14  . Anemia   . Chronic kidney disease     stage IV. previous HD, none currently 10/30/15    Past Surgical History: Past Surgical History  Procedure Laterality Date  . Cholecystectomy    . Appendectomy    . Small intestine surgery      Due to Small Bowel Obstruction  . Tubal ligation    . Knee arthroscopy Right 08/10/2014    Procedure: ARTHROSCOPY I & D KNEE;  Surgeon: Marianna Payment, MD;  Location: [facility];  Service: Orthopedics;  Laterality: Right;  . Shoulder arthroscopy Bilateral 08/10/2014    Procedure: I & D BILATERAL SHOULDERS ;  Surgeon: Marianna Payment, MD;  Location: [facility];  Service: Orthopedics;  Laterality: Bilateral;  . Tenosynovectomy Right 08/11/2014    Procedure: RIGHT WRIST IRRIGATION AND DEBRIDEMENT, TENOSYNOVECTOMY;  Surgeon: Marianna Payment, MD;  Location: [facility] [place];  Service: Orthopedics;  Laterality: Right;  . Knee arthroscopy Left 08/11/2014    Procedure: ARTHROSCOPIC WASHOUT LEFT KNEE;  Surgeon: Marianna Payment, MD;  Location: [place];  Service: Orthopedics;  Laterality: Left;  . Tee without cardioversion N/A 08/14/2014    Procedure: TRANSESOPHAGEAL ECHOCARDIOGRAM (TEE);  Surgeon: Thayer Headings, MD;  Location: [place];  Service: Cardiovascular;  Laterality: N/A;  . Knee arthroscopy Left 08/19/2014    Procedure: ARTHROSCOPIC WASHOUT LEFT KNEE;  Surgeon: Leandrew Koyanagi, MD;  Location: [place];  Service: Orthopedics;  Laterality: Left;  . Av fistula placement Left 09/13/2014    Procedure: Brachial Artery to Brachial Vein Gortex Four - Seven Stretch GRAFT INSERTION Left Forearm;  Surgeon: Mal Misty, MD;  Location:  [facility];  Service: Vascular;  Laterality: Left;  . Knee arthroscopy with medial menisectomy Left 04/04/2015    Procedure: LEFT KNEE ARTHROSCOPY WITH PARTIAL MEDIAL MENISCECTOMY  AND SYNOVECTOMY;  Surgeon: Leandrew Koyanagi, MD;  Location: [facility];  Service: Orthopedics;  Laterality: Left;  . Knee arthroscopy with lateral menisectomy Left 04/04/2015    Procedure: AND PARTIAL LATERAL MENISECTOMY;  Surgeon: Leandrew Koyanagi, MD;  Location: [facility];  Service: Orthopedics;  Laterality: Left;  . Total knee arthroplasty Left 11/07/2015    Procedure: LEFT TOTAL KNEE ARTHROPLASTY WITH REVISION OF IMPLANTS;  Surgeon: Leandrew Koyanagi, MD;  Location: [facility];  Service: Orthopedics;  Laterality: Left;     Allergies:   Allergies  Allergen Reactions  . Compazine [Prochlorperazine] Shortness Of Breath and Swelling    TONGUE SWELLS  . Shellfish-Derived Products Anaphylaxis  . Iodinated Diagnostic Agents Hives and Rash  . Omnipaque [Iohexol] Hives     Social History:  reports that she has been smoking Cigarettes.  She has a 10 pack-year smoking history. She has never used smokeless tobacco. She reports that she does not drink alcohol or use illicit drugs.   Family History: Family History  Problem Relation Age of Onset  . Cancer Mother   . Heart disease Father   . Cancer Sister   . Cancer Brother   . Diabetes Brother      Procedures/Significant Events:  08/09/2014 Echocardiogram;- Left ventricle: moderate LVH. -LVEF= 50%- 55%.  -(grade 2 diastolicdysfunction). - Aortic valve: There was mild regurgitation. 5/25 transfused 2 units PRBC  Cultures   Antimicrobials: Vancomycin 5/251 dose   Devices    LINES / TUBES:      Physical Exam: Filed Vitals:   11/08/15 1041 11/08/15 1122 11/08/15 1140 11/08/15 1415  BP: 134/70 114/89 130/53 130/56  Pulse: 78 77 72 77  Temp: 99 F (37.2 C) 99 F (37.2 C) 98.8 F (37.1 C) 98.2 F (36.8 C)  TempSrc: Oral Oral Oral  Oral  Resp:      Height:      Weight:      SpO2: 99% 92% 92% 98%    General: A/O 4, positive acute distress secondary to left knee pain, No acute respiratory distress Eyes: negative scleral hemorrhage, negative anisocoria, negative icterus ENT: Negative Runny nose, negative gingival bleeding, Neck:  Negative scars, masses, torticollis, lymphadenopathy, JVD Lungs: Clear to auscultation bilaterally without wheezes or crackles Cardiovascular: Regular rate and rhythm without murmur gallop or rub normal S1 and S2 Abdomen: negative abdominal pain, nondistended, positive soft, bowel sounds, no rebound, no ascites, no appreciable mass Extremities: No significant cyanosis, clubbing, or edema of right lower extremity. Left lower extremity appropriately swollen S/P TKA. Left knee newly bandaged with ice pack did not take down for exam. Skin: Negative rashes, lesions, ulcers Psychiatric:  Negative depression, negative anxiety, negative fatigue, negative mania  Central nervous system:  Cranial nerves II through XII intact, tongue/uvula midline, all extremities muscle strength 5/5, sensation intact throughout, negative dysarthria, negative expressive aphasia, negative receptive aphasia.  Data reviewed:  I have personally reviewed following labs and imaging studies Labs:  CBC:  Recent Labs Lab 11/07/15 0924 11/07/15 0930 11/07/15 1440 11/07/15  1932 11/08/15 0150 11/08/15 0600  WBC 6.8  --  8.0 9.5 9.1 9.2  HGB 10.4* 12.6 8.3* 7.6* 7.1* 6.7*  HCT 33.7* 37.0 24.9* 22.6* 21.4* 20.5*  MCV 94.4  --  88.6 88.6 88.4 91.1  PLT 269  --  154  151 145* 123* 129*    Basic Metabolic Panel:  Recent Labs Lab 11/07/15 0930 11/08/15 0150 11/08/15 0600  NA 140  --  139  K 3.7  --  4.1  CL  --   --  101  CO2  --   --  30  GLUCOSE 146*  --  150*  BUN  --   --  44*  CREATININE  --  2.38* 2.52*  CALCIUM  --   --  7.6*   GFR Estimated Creatinine Clearance: 22.8 mL/min (by C-G formula based on Cr  of 2.52). Liver Function Tests: No results for input(s): AST, ALT, ALKPHOS, BILITOT, PROT, ALBUMIN in the last 168 hours. No results for input(s): LIPASE, AMYLASE in the last 168 hours. No results for input(s): AMMONIA in the last 168 hours. Coagulation profile  Recent Labs Lab 11/07/15 1440 11/08/15 0600  INR 1.51* 1.29    Cardiac Enzymes: No results for input(s): CKTOTAL, CKMB, CKMBINDEX, TROPONINI in the last 168 hours. BNP: Invalid input(s): POCBNP CBG:  Recent Labs Lab 11/07/15 1425 11/07/15 1831 11/07/15 2231 11/08/15 0906 11/08/15 1207  GLUCAP 146* 160* 131* 141* 189*   D-Dimer  Recent Labs  11/07/15 1440 11/08/15 0600  DDIMER 7.80* 3.22*   Hgb A1c No results for input(s): HGBA1C in the last 72 hours. Lipid Profile No results for input(s): CHOL, HDL, LDLCALC, TRIG, CHOLHDL, LDLDIRECT in the last 72 hours. Thyroid function studies No results for input(s): TSH, T4TOTAL, T3FREE, THYROIDAB in the last 72 hours.  Invalid input(s): FREET3 Anemia work up No results for input(s): VITAMINB12, FOLATE, FERRITIN, TIBC, IRON, RETICCTPCT in the last 72 hours. Urinalysis    Component Value Date/Time   COLORURINE YELLOW 10/29/2015 [place] 10/29/2015 1425   LABSPEC 1.017 10/29/2015 1425   PHURINE 5.5 10/29/2015 1425   GLUCOSEU NEGATIVE 10/29/2015 1425   HGBUR NEGATIVE 10/29/2015 [place] 10/29/2015 [place] 10/29/2015 1425   PROTEINUR 100* 10/29/2015 1425   UROBILINOGEN 0.2 09/02/2014 1504   NITRITE NEGATIVE 10/29/2015 1425   LEUKOCYTESUR SMALL* 10/29/2015 1425     Microbiology Recent Results (from the past 240 hour(s))  Aerobic/Anaerobic Culture(surg specimen) (NOT AT [facility])     Status: None (Preliminary result)   Collection Time: 11/07/15 10:27 AM  Result Value Ref Range Status   Specimen Description TISSUE LEFT KNEE  Final   Special Requests SYNOVIUM  Final   Gram Stain   Final    FEW WBC  PRESENT,BOTH PMN AND MONONUCLEAR NO ORGANISMS SEEN    Culture NO GROWTH 1 DAY  Final   Report Status PENDING  Incomplete       Inpatient Medications:   Scheduled Meds: . sodium chloride   Intravenous Once  . acetaminophen  1,000 mg Oral Q6H  . enoxaparin (LOVENOX) injection  30 mg Subcutaneous Q24H  . fentaNYL (SUBLIMAZE) injection  100 mcg Intravenous Once  . furosemide  160 mg Oral TID  . gabapentin  100 mg Oral BID  . hydrALAZINE  100 mg Oral BID  . insulin aspart  0-15 Units Subcutaneous TID WC  . insulin aspart  0-5 Units Subcutaneous QHS  . insulin aspart  4 Units  Subcutaneous TID WC  . insulin glargine  15 Units Subcutaneous QHS  . metoprolol succinate  50 mg Oral Daily  . midazolam  2 mg Intravenous Once  . multivitamin  1 tablet Oral QHS  . multivitamin with minerals  1 tablet Oral Daily  . oxyCODONE  15 mg Oral Q12H  . pantoprazole  40 mg Oral Daily  . potassium chloride  20 mEq Oral Daily  . sertraline  100 mg Oral Daily   Continuous Infusions: . sodium chloride 10 mL/hr at 11/07/15 1021  . sodium chloride 125 mL/hr at 11/08/15 0500     Radiological Exams on Admission: Dg Knee Left Port  11/07/2015  CLINICAL DATA:  Total knee replacement. EXAM: PORTABLE LEFT KNEE - 1-2 VIEW COMPARISON:  10/12/2015 FINDINGS: Total knee arthroplasty without periprosthetic fracture or subluxation. Antibiotic beads noted mainly in the suprapatellar joint space neighboring a drain. IMPRESSION: Total knee arthroplasty without acute finding. Electronically Signed   By: Monte Fantasia M.D.   On: 11/07/2015 14:42    Impression/Recommendations Active Problems:   Knee joint replacement status   Total knee replacement status   Normocytic anemia   Diabetes mellitus with complication ([name])   Chronic kidney disease (CKD), stage IV (severe) ([name])   Postoperative surgical complication involving circulatory system   Chronic diastolic CHF (congestive heart failure) ([place])   Uncontrolled  type 2 diabetes mellitus with complication ([name])   Status post total left knee replacement   Essential hypertension   Acute blood loss anemia   Diabetic peripheral neuropathy ([name])   Anxiety state   Depression  Left TKA -Per surgery  Chronic diastolic CHF -Furosemide 0000000 mg TID -Hydralazine 100 mg BID -Metoprolol 50 mg daily -Strict in and out -Daily weight Filed Weights   11/07/15 0927  Weight: 71.668 kg (158 lb)  -Transfuse for hemoglobin<8 -5/25 transfused 2 units PRBC - Hypertension -See chronic diastolic CHF  Diabetes type 2 uncontrolled with complication -Hemoglobin A1c pending -Lipid panel pending -Lantus 15 units daily -NovoLog 4 units QAC -Moderate SSI  Acute blood loss anemia -See CHF  Peripheral neuropathy -Gabapentin 100 mg BID   CKD stage IV (baseline Cr~2-3) -Monitor closely; at baseline Lab Results  Component Value Date   CREATININE 2.52* 11/08/2015   CREATININE 2.38* 11/08/2015   CREATININE 2.12* 10/29/2015   Anxiety/Depression -Zoloft 100 mg daily     Thank you for this consultation.  Our [facility] hospitalist team will follow the patient with you.   Time Spent: 35 minutes  Aune Adami, Geraldo Docker M.D. [facility] 11/08/2015, 4:51 PM

## 2015-11-08 NOTE — Evaluation (Signed)
Physical Therapy Evaluation Patient Details Name: Brandy Houston MRN: [id number] DOB: 15-Mar-1956 Today's Date: 11/08/2015   History of Present Illness  Pt is a 60 yo female admitted for a L TKA.  Pt with acute blood loss anemia post op with Hgb of 6.7.  Pt received 1 of  2 units of blood before evaluation.  Clinical Impression  Patient demonstrates deficits in functional mobility as indicated below. Will need continued skilled PT to address deficits and maximize function. Will see as indicated and progress as tolerated. Will need ST SNF upon acute discharge.    Follow Up Recommendations SNF    Equipment Recommendations  None recommended by PT    Recommendations for Other Services       Precautions / Restrictions Precautions Precautions: Fall Precaution Comments: Pt unable to bear weight through LLE but is WBAT Restrictions Weight Bearing Restrictions: No      Mobility  Bed Mobility Overal bed mobility: Needs Assistance Bed Mobility: Supine to Sit     Supine to sit: Min assist     General bed mobility comments: min assist needed to assist with LLE. Pt unable to move LLE on own and has great pain when lowering to the floor.  Transfers Overall transfer level: Needs assistance Equipment used: Rolling walker (2 wheeled);2 person hand held assist Transfers: Sit to/from [facility] Sit to Stand: Mod assist Stand pivot transfers: Mod assist;+2 physical assistance       General transfer comment: Pt not able to bear weight through LLE making transfers difficult and painful.  Ambulation/Gait                Stairs            Wheelchair Mobility    Modified Rankin (Stroke Patients Only)       Balance Overall balance assessment: Needs assistance Sitting-balance support: Feet supported Sitting balance-Leahy Scale: Good     Standing balance support: Bilateral upper extremity supported;During functional activity Standing balance-Leahy  Scale: Zero Standing balance comment: Pt must have walker and outside support to remain standing.                             Pertinent Vitals/Pain Pain Assessment: 0-10 Pain Score: 8  Pain Location: L knee Pain Descriptors / Indicators: Aching;Operative site guarding;Grimacing;Guarding Pain Intervention(s): Limited activity within patient's tolerance;Monitored during session;Premedicated before session;Repositioned;Ice applied;Relaxation    Home Living Family/patient expects to be discharged to:: Private residence Living Arrangements: Spouse/significant other Available Help at Discharge: Family Type of Home: House Home Access: Stairs to enter Entrance Stairs-Rails: Right Entrance Stairs-Number of Steps: 3 Home Layout: Two level;Able to live on main level with bedroom/bathroom Home Equipment: Gilford Rile - 2 wheels;Walker - 4 wheels;Cane - single point;Bedside commode;Shower seat;Wheelchair - manual Additional Comments: Pt would roll WC to bathroom door and then hobble 4 steps holding to sink and wall to get to the shower.  Otherwise pt was nonambulatory since Jan.    Prior Function Level of Independence: Needs assistance   Gait / Transfers Assistance Needed: Pt transferred to w/c from bed w/o assist. pt rolled w/c [place]. Pt nonambulatory since January except to hobble to shower from bathroom door b/c w/c does not fit in bathroom.   ADL's / Homemaking Assistance Needed: Family assists with all homemaking.        Hand Dominance   Dominant Hand: Right    Extremity/Trunk Assessment   Upper Extremity Assessment: Overall [facility]  for tasks assessed           Lower Extremity Assessment: LLE deficits/detail   LLE Deficits / Details: LE quad lag noted,  Cervical / Trunk Assessment: Normal  Communication   Communication: No difficulties  Cognition Arousal/Alertness: Awake/alert Behavior During Therapy: WFL for tasks assessed/performed Overall Cognitive Status: Within  Functional Limits for tasks assessed                      General Comments General comments (skin integrity, edema, etc.): Pt most limited by pain.  No active movement noted in LLE.      Exercises        Assessment/Plan    PT Assessment Patient needs continued PT services  PT Diagnosis Difficulty walking;Generalized weakness;Acute pain   PT Problem List Decreased strength;Decreased range of motion;Decreased activity tolerance;Decreased balance;Decreased mobility;Decreased coordination;Decreased knowledge of use of DME;Pain  PT Treatment Interventions DME instruction;Gait training;Functional mobility training;Therapeutic activities;Therapeutic exercise;Balance training;Patient/family education   PT Goals (Current goals can be found in the Care Plan section) Acute Rehab PT Goals Patient Stated Goal: to not have so much pain. PT Goal Formulation: With patient Time For Goal Achievement: 11/22/15 Potential to Achieve Goals: Good    Frequency 7X/week   Barriers to discharge        Co-evaluation   Reason for Co-Treatment: Complexity of the patient's impairments (multi-system involvement);For patient/therapist safety PT goals addressed during session: Mobility/safety with mobility OT goals addressed during session: ADL's and self-care       End of Session Equipment Utilized During Treatment: Gait belt Activity Tolerance: Patient limited by pain Patient left: in chair;with call bell/phone within reach Nurse Communication: Mobility status         Time: [id number] PT Time Calculation (min) (ACUTE ONLY): 25 min   Charges:   PT Evaluation $PT Eval Moderate Complexity: 1 Procedure     PT G CodesDuncan Dull 11/13/2015, 1:59 PM Alben Deeds, [place] DPT  [phone]

## 2015-11-08 NOTE — Evaluation (Signed)
Occupational Therapy Evaluation Patient Details Name: Brandy Houston MRN: [id number] DOB: June 04, 1956 Today's Date: 11/08/2015    History of Present Illness Pt is a 60 yo female admitted for a L TKA.  Pt with acute blood loss anemia post op with Hgb of 6.7.  Pt received 1 of  2 units of blood before evaluation.   Clinical Impression   Pt admitted with the above diagnosis and has the deficits listed below. Pt would benefit from cont OT to increase independence with basic adls and adl transfers so she can eventually return home with her family after rehab.  Pt lives with family but requires more help than they can provide at this time.      Follow Up Recommendations  SNF;Supervision/Assistance - 24 hour    Equipment Recommendations  None recommended by OT    Recommendations for Other Services       Precautions / Restrictions Precautions Precautions: Fall Precaution Comments: Pt unable to bear weight through LLE but is WBAT Restrictions Weight Bearing Restrictions: No      Mobility Bed Mobility Overal bed mobility: Needs Assistance Bed Mobility: Supine to Sit     Supine to sit: Min assist     General bed mobility comments: min assist needed to assist with LLE. Pt unable to move LLE on own and has great pain when lowering to the floor.  Transfers Overall transfer level: Needs assistance Equipment used: Rolling walker (2 wheeled);2 person hand held assist Transfers: Sit to/from [facility] Sit to Stand: Mod assist Stand pivot transfers: Mod assist;+2 physical assistance       General transfer comment: Pt not able to bear weight through LLE making transfers difficult and painful.    Balance Overall balance assessment: Needs assistance Sitting-balance support: Feet supported Sitting balance-Leahy Scale: Good     Standing balance support: Bilateral upper extremity supported;During functional activity Standing balance-Leahy Scale: Zero Standing  balance comment: Pt must have walker and outside support to remain standing.                            ADL Overall ADL's : Needs assistance/impaired Eating/Feeding: Independent;Sitting   Grooming: Wash/dry hands;Wash/dry face;Oral care;Set up;Sitting Grooming Details (indicate cue type and reason): Pt unable to stand to groom due to pain. Upper Body Bathing: Set up;Sitting   Lower Body Bathing: Moderate assistance;Sit to/from stand Lower Body Bathing Details (indicate cue type and reason): Pt unable to reach lower legs sitting at this point due to pain. Pt required +2 mod assist to stand and then cannot let go of walker to bathe bottom. Upper Body Dressing : Set up;Sitting   Lower Body Dressing: Maximal assistance;Sit to/from stand Lower Body Dressing Details (indicate cue type and reason): Pt requires a great amount of assist dressing LE due to pain and inability to reach feet at this time.  Pt may need adaptive equipment to increase independence with dressing. Toilet Transfer: Moderate assistance;+2 for physical assistance;Stand-pivot;BSC Toilet Transfer Details (indicate cue type and reason): Pt unable to weight bear on LLE at this time.   Toileting- Clothing Manipulation and Hygiene: Moderate assistance;Sitting/lateral lean Toileting - Clothing Manipulation Details (indicate cue type and reason): Pt requires total assist to stand and clean self b/c she is unable to let go of walker.  Pt requires min assist to lean side to side due to pain.     Functional mobility during ADLs: Moderate assistance;+2 for physical assistance;Rolling walker General ADL  Comments: Pt limited with LE adls and adls in standing due to pain.     Vision Vision Assessment?: No apparent visual deficits   Perception Perception Perception Tested?: No   Praxis      Pertinent Vitals/Pain Pain Assessment: 0-10 Pain Score: 8  Pain Location: L knee Pain Descriptors / Indicators: Aching;Operative  site guarding;Grimacing;Guarding Pain Intervention(s): Limited activity within patient's tolerance;Monitored during session;Premedicated before session;Repositioned;Ice applied;Relaxation     Hand Dominance Right   Extremity/Trunk Assessment Upper Extremity Assessment Upper Extremity Assessment: Overall WFL for tasks assessed   Lower Extremity Assessment Lower Extremity Assessment: Defer to PT evaluation   Cervical / Trunk Assessment Cervical / Trunk Assessment: Normal   Communication Communication Communication: No difficulties   Cognition Arousal/Alertness: Awake/alert Behavior During Therapy: WFL for tasks assessed/performed Overall Cognitive Status: Within Functional Limits for tasks assessed                     General Comments       Exercises       Shoulder Instructions      Home Living Family/patient expects to be discharged to:: Private residence Living Arrangements: Spouse/significant other Available Help at Discharge: Family Type of Home: House Home Access: Stairs to enter Technical brewer of Steps: 3 Entrance Stairs-Rails: Right Home Layout: Two level;Able to live on main level with bedroom/bathroom     Bathroom Shower/Tub: Occupational psychologist:  (uses BSC outside of bathroom) Bathroom Accessibility: No   Home Equipment: Environmental consultant - 2 wheels;Walker - 4 wheels;Cane - single point;Bedside commode;Shower seat;Wheelchair - manual   Additional Comments: Pt would roll WC to bathroom door and then hobble 4 steps holding to sink and wall to get to the shower.  Otherwise pt was nonambulatory since Jan.      Prior Functioning/Environment Level of Independence: Needs assistance  Gait / Transfers Assistance Needed: Pt transferred to w/c from bed w/o assist. pt rolled w/c Ily. Pt nonambulatory since January except to hobble to shower from bathroom door b/c w/c does not fit in bathroom.  ADL's / Homemaking Assistance Needed: Family assists  with all homemaking.        OT Diagnosis: Acute pain;Generalized weakness   OT Problem List: Decreased strength;Decreased activity tolerance;Decreased range of motion;Impaired balance (sitting and/or standing);Decreased knowledge of use of DME or AE;Pain   OT Treatment/Interventions: Self-care/ADL training;DME and/or AE instruction;Therapeutic activities    OT Goals(Current goals can be found in the care plan section) Acute Rehab OT Goals Patient Stated Goal: to not have so much pain. OT Goal Formulation: With patient Time For Goal Achievement: 11/22/15 Potential to Achieve Goals: Good ADL Goals Pt Will Perform Grooming: with min guard assist;standing Pt Will Perform Lower Body Bathing: with min assist;sit to/from stand Pt Will Perform Lower Body Dressing: with min assist;with adaptive equipment;sit to/from stand Pt Will Perform Tub/Shower Transfer: Shower transfer;with mod assist;ambulating;shower seat;rolling walker Additional ADL Goal #1: Pt will pivot to [facility] with walker and toilet with S.  OT Frequency: Min 2X/week   Barriers to D/C: Decreased caregiver support  Pt not at physical level that one person can care for her.       Co-evaluation PT/OT/SLP Co-Evaluation/Treatment: Yes Reason for Co-Treatment: Complexity of the patient's impairments (multi-system involvement);For patient/therapist safety PT goals addressed during session: Mobility/safety with mobility OT goals addressed during session: ADL's and self-care      End of Session Equipment Utilized During Treatment: Rolling walker CPM Left Knee CPM Left Knee: Off Nurse  Communication: Mobility status (transfer toward her strong side.)  Activity Tolerance: Patient limited by pain Patient left: in chair;with call bell/phone within reach;with nursing/sitter in room   Time: 1043-1107 OT Time Calculation (min): 24 min Charges:  OT General Charges $OT Visit: 1 Procedure OT Evaluation $OT Eval Moderate Complexity: 1  Procedure G-Codes:    Esme, Negro Dec 05, 2015, 11:30 AM  [phone]

## 2015-11-08 NOTE — Progress Notes (Signed)
CRITICAL VALUE ALERT  Critical value received:  Hg 6.7  Date of notification:  11/08/2015  Time of notification:  0728  Critical value read back:yes   Nurse who received alert:  Pamala Hurry   MD notified (1st page):  [name]  Time of first page:  0739  MD notified (2nd page):  Time of second page:  Responding MD:  [name]  Time MD responded:  [phone]

## 2015-11-09 DIAGNOSIS — I10 Essential (primary) hypertension: Secondary | ICD-10-CM

## 2015-11-09 DIAGNOSIS — D62 Acute posthemorrhagic anemia: Secondary | ICD-10-CM

## 2015-11-09 DIAGNOSIS — N184 Chronic kidney disease, stage 4 (severe): Secondary | ICD-10-CM

## 2015-11-09 DIAGNOSIS — I5032 Chronic diastolic (congestive) heart failure: Secondary | ICD-10-CM

## 2015-11-09 LAB — TYPE AND SCREEN
ABO/RH(D): A POS
Antibody Screen: NEGATIVE
Unit division: 0
Unit division: 0
Unit division: 0
Unit division: 0
Unit division: 0
Unit division: 0

## 2015-11-09 LAB — CBC
HCT: 25.8 % — ABNORMAL LOW (ref 36.0–46.0)
Hemoglobin: 8.5 g/dL — ABNORMAL LOW (ref 12.0–15.0)
MCH: 28.7 pg (ref 26.0–34.0)
MCHC: 32.9 g/dL (ref 30.0–36.0)
MCV: 87.2 fL (ref 78.0–100.0)
Platelets: 144 10*3/uL — ABNORMAL LOW (ref 150–400)
RBC: 2.96 MIL/uL — ABNORMAL LOW (ref 3.87–5.11)
RDW: 18.9 % — ABNORMAL HIGH (ref 11.5–15.5)
WBC: 12.9 10*3/uL — ABNORMAL HIGH (ref 4.0–10.5)

## 2015-11-09 LAB — GLUCOSE, CAPILLARY
Glucose-Capillary: 66 mg/dL (ref 65–99)
Glucose-Capillary: 66 mg/dL (ref 65–99)
Glucose-Capillary: 66 mg/dL (ref 65–99)
Glucose-Capillary: 114 mg/dL — ABNORMAL HIGH (ref 65–99)
Glucose-Capillary: 133 mg/dL — ABNORMAL HIGH (ref 65–99)
Glucose-Capillary: 185 mg/dL — ABNORMAL HIGH (ref 65–99)
Glucose-Capillary: 165 mg/dL — ABNORMAL HIGH (ref 65–99)

## 2015-11-09 LAB — MAGNESIUM: Magnesium: 2 mg/dL (ref 1.7–2.4)

## 2015-11-09 MED ORDER — FUROSEMIDE 80 MG PO TABS
80.0000 mg | ORAL_TABLET | Freq: Three times a day (TID) | ORAL | Status: DC
  Administered 2015-11-09 – 2015-11-10 (×3): 80 mg via ORAL
  Filled 2015-11-09 (×3): qty 1

## 2015-11-09 MED ORDER — SODIUM CHLORIDE 0.9 % IV SOLN: Freq: Once | INTRAVENOUS | Status: DC

## 2015-11-09 MED ORDER — NICOTINE 21 MG/24HR TD PT24
21.0000 mg | MEDICATED_PATCH | Freq: Every day | TRANSDERMAL | Status: DC
  Administered 2015-11-09 – 2015-11-13 (×5): 21 mg via TRANSDERMAL
  Filled 2015-11-09 (×5): qty 1

## 2015-11-09 NOTE — Progress Notes (Signed)
   Subjective:  Patient reports pain as improved  Objective:   VITALS:   Filed Vitals:   11/08/15 2354 11/09/15 0314 11/09/15 0319 11/09/15 0721  BP: 128/53  123/70 118/65  Pulse: 73  73 74  Temp: 98.8 F (37.1 C)  98.1 F (36.7 C) 98.5 F (36.9 C)  TempSrc: Oral  Oral Oral  Resp: 11  20   Height:      Weight:  79.4 kg (175 lb 0.7 oz)    SpO2: 98%  93% 93%    Neurologically intact Neurovascular intact Sensation intact distally Intact pulses distally Dorsiflexion/Plantar flexion intact Incision: dressing C/D/I and no drainage No cellulitis present Compartment soft   Lab Results  Component Value Date   WBC 12.9* 11/09/2015   HGB 8.5* 11/09/2015   HCT 25.8* 11/09/2015   MCV 87.2 11/09/2015   PLT 144* 11/09/2015     Assessment/Plan:  2 Days Post-Op   - Expected postop acute blood loss anemia - will monitor for symptoms - Up with PT/OT - DVT ppx - SCDs, ambulation, lovenox - WBAT operative extremity - Pain control - stable - appreciate hospitalist comanagement  Marianna Payment 11/09/2015, 7:22 AM [phone]

## 2015-11-09 NOTE — Progress Notes (Signed)
Orthopedic Tech Progress Note Patient Details:  Brandy Houston 06/21/1955 [id number]  CPM Left Knee CPM Left Knee: On Left Knee Flexion (Degrees): 90 Left Knee Extension (Degrees): 0   [name] 11/09/2015, 12:45 PM

## 2015-11-09 NOTE — Progress Notes (Signed)
Physical Therapy Treatment Patient Details Name: Brandy Houston MRN: [id number] DOB: 03/13/1956 Today's Date: 11/09/2015    History of Present Illness Pt is a 60 yo female admitted for a L TKA.  Pt with acute blood loss anemia post op with Hgb of 6.7.  Pt received 1 of  2 units of blood before evaluation.Pt with significant PMHx of HTN, peripheral neuropathy, hepatitis C, DM, depression, anxiety, septic arthritis, anemia, chronic kidney disease, bil shoulder arthroscopy, and bil knee arthroscopy.    PT Comments    Pt is progressing well, albeit, slowly with PT.  She was able to walk a short distance in her room with RW and chair to follow today and TK exercises initiated.  PT will continue to follow acutely and continue to recommend SNF level rehab at discharge.   Follow Up Recommendations  SNF     Equipment Recommendations  None recommended by PT    Recommendations for Other Services   NA     Precautions / Restrictions Precautions Precautions: Fall Restrictions Weight Bearing Restrictions: No LLE Weight Bearing: Weight bearing as tolerated    Mobility  Bed Mobility Overal bed mobility: Needs Assistance Bed Mobility: Supine to Sit     Supine to sit: Min assist;HOB elevated     General bed mobility comments: Min assist to help progress left leg over EOB.  Bed rail used for leverage and HOB mildly elevated.   Transfers Overall transfer level: Needs assistance Equipment used: Rolling walker (2 wheeled) Transfers: Sit to/from Stand Sit to Stand: Min assist Stand pivot transfers: Min assist       General transfer comment: Min assist to support trunk and stabilize RW during transitions.  Verbal cues for safe hand placement and safe speed of movement.    Ambulation/Gait Ambulation/Gait assistance: Mod assist;+2 safety/equipment (chair to follow) Ambulation Distance (Feet): 10 Feet Assistive device: Rolling walker (2 wheeled) Gait Pattern/deviations: Step-to  pattern;Antalgic     General Gait Details: Pt with very antalgic gait pattern, verbal cues for use of arms to unweight painful leg, yet progressively trying to put more weight through leg with every step          Balance Overall balance assessment: Needs assistance Sitting-balance support: Feet supported;No upper extremity supported Sitting balance-Leahy Scale: Good     Standing balance support: Bilateral upper extremity supported Standing balance-Leahy Scale: Poor                      Cognition Arousal/Alertness: Awake/alert Behavior During Therapy: WFL for tasks assessed/performed Overall Cognitive Status: Within Functional Limits for tasks assessed                      Exercises Total Joint Exercises Ankle Circles/Pumps: AROM;Both;10 reps Quad Sets: AROM;Left;10 reps Heel Slides: AAROM;Left;10 reps Hip ABduction/ADduction: AAROM;Left;10 reps Straight Leg Raises: AAROM;Left;10 reps        Pertinent Vitals/Pain Pain Assessment: 0-10 Pain Score: 8  Pain Location: left knee Pain Descriptors / Indicators: Aching;Grimacing;Guarding Pain Intervention(s): Limited activity within patient's tolerance;Monitored during session;Repositioned           PT Goals (current goals can now be found in the care plan section) Acute Rehab PT Goals Patient Stated Goal: to not have so much pain. Progress towards PT goals: Progressing toward goals    Frequency  7X/week    PT Plan Current plan remains appropriate       End of Session Equipment Utilized During Treatment: Gait belt;Left knee  immobilizer Activity Tolerance: Patient limited by pain Patient left: in chair;with call bell/phone within reach;with chair alarm set     Time: [id number] PT Time Calculation (min) (ACUTE ONLY): 24 min  Charges:  $Gait Training: 8-22 mins $Therapeutic Exercise: 8-22 mins                      Brandy Houston B. Faith, [place], DPT [phone]   11/09/2015, 5:28 PM

## 2015-11-09 NOTE — Consult Note (Addendum)
[place] TEAM 1 - Stepdown/ICU TEAM CONSULT F/U NOTE  JAQUAYA HELDMAN [id number] DOB: [birth year] DOA: 11/07/2015 PCP: Lucrezia Starch, MD  Admit HPI / Brief Narrative: 60 y.o. female with a history of HTN, DM, recently treated HCV, CKD (required temporary hemodialysis last year for deterioration in renal function associated with septic arthritis) who was admitted for and underwent Left total knee arthroplasty on 5/24.  Intraoperatively she lost a significant amount of blood. Work up for DIC ordered by primary team. No prior history of clotting problems per patient.   HPI/Subjective: C/o poorly controlled pain in her knew.  Had some persisting hypoglycemia this morning.  Denies cp, n/v, or abdom pain at present.    Recommendations/Plan:  Left TKA -per Ortho (primary service)  Acute blood loss anemia -due to surgery - Hgb stable at present   Recent Labs Lab 11/07/15 1440 11/07/15 1932 11/08/15 0150 11/08/15 0600 11/08/15 1612 11/09/15 0540  HGB 8.3* 7.6* 7.1* 6.7* 9.0* 8.5*    Chronic diastolic CHF -TTE Feb [id number] noted EF 50-55% no WMA, and grade 2 DD -net + 3.3L since admit - decrease lasix dose in setting of slowly improving intake - slowly titrate back toward home dose as intake increases  Filed Weights   11/07/15 0927 11/08/15 1714 11/09/15 0314  Weight: 71.668 kg (158 lb) 79.3 kg (174 lb 13.2 oz) 79.4 kg (175 lb 0.7 oz)    CKD stage IV -baseline Cr~2-3 - stable at present   Recent Labs Lab 11/08/15 0150 11/08/15 0600  CREATININE 2.38* 2.52*    Hypertension -BP well controlled   DM2 -A1c 7.3 10/29/15 - some hypoglycemia this morning - adjust tx and follow    Peripheral neuropathy -Gabapentin 100 mg BID  Anxiety/Depression -Zoloft 100 mg daily  Code Status: FULL Family Communication: no family present at time of exam  Antibiotics: None presently   DVT prophylaxis: lovenox   Objective: Blood pressure 118/65, pulse 74, temperature 98.5 F  (36.9 C), temperature source Oral, resp. rate 20, height 5\' 7"  (1.702 m), weight 79.4 kg (175 lb 0.7 oz), last menstrual period 03/16/2002, SpO2 93 %.  Intake/Output Summary (Last 24 hours) at 11/09/15 1035 Last data filed at 11/09/15 0500  Gross per 24 hour  Intake 1826.25 ml  Output   1600 ml  Net 226.25 ml   Exam: General: No acute respiratory distress Lungs: Clear to auscultation bilaterally without wheezes or crackles Cardiovascular: Regular rate and rhythm without gallop or rub - 2/6 systolic M Abdomen: Nontender, nondistended, soft, bowel sounds positive, no rebound, no ascites, no appreciable mass Extremities: No significant cyanosis, clubbing, or edema bilateral lower extremities  Data Reviewed: Basic Metabolic Panel:  Recent Labs Lab 11/07/15 0930 11/08/15 0150 11/08/15 0600 11/09/15 0540  NA 140  --  139  --   K 3.7  --  4.1  --   CL  --   --  101  --   CO2  --   --  30  --   GLUCOSE 146*  --  150*  --   BUN  --   --  44*  --   CREATININE  --  2.38* 2.52*  --   CALCIUM  --   --  7.6*  --   MG  --   --   --  2.0    CBC:  Recent Labs Lab 11/07/15 1932 11/08/15 0150 11/08/15 0600 11/08/15 1612 11/09/15 0540  WBC 9.5 9.1 9.2 10.9* 12.9*  HGB 7.6* 7.1* 6.7*  9.0* 8.5*  HCT 22.6* 21.4* 20.5* 27.2* 25.8*  MCV 88.6 88.4 91.1 85.8 87.2  PLT 145* 123* 129* 136* 144*    Liver Function Tests: No results for input(s): AST, ALT, ALKPHOS, BILITOT, PROT, ALBUMIN in the last 168 hours. No results for input(s): LIPASE, AMYLASE in the last 168 hours. No results for input(s): AMMONIA in the last 168 hours.  Coags:  Recent Labs Lab 11/07/15 1440 11/08/15 0600  INR 1.51* 1.29    Recent Labs Lab 11/07/15 1440  APTT 38*   CBG:  Recent Labs Lab 11/08/15 2104 11/09/15 0729 11/09/15 0837 11/09/15 0839 11/09/15 0944  GLUCAP 192* 66 66 66 114*    Recent Results (from the past 240 hour(s))  Aerobic/Anaerobic Culture(surg specimen) (NOT AT [facility])      Status: None (Preliminary result)   Collection Time: 11/07/15 10:27 AM  Result Value Ref Range Status   Specimen Description TISSUE LEFT KNEE  Final   Special Requests SYNOVIUM  Final   Gram Stain   Final    FEW WBC PRESENT,BOTH PMN AND MONONUCLEAR NO ORGANISMS SEEN    Culture NO GROWTH 1 DAY  Final   Report Status PENDING  Incomplete     Studies:   Recent x-ray studies have been reviewed in detail by the Attending Physician  Scheduled Meds:  Scheduled Meds: . sodium chloride   Intravenous Once  . enoxaparin (LOVENOX) injection  30 mg Subcutaneous Q24H  . fentaNYL (SUBLIMAZE) injection  100 mcg Intravenous Once  . furosemide  160 mg Oral TID  . gabapentin  100 mg Oral BID  . hydrALAZINE  100 mg Oral BID  . insulin aspart  0-15 Units Subcutaneous TID WC  . insulin aspart  0-5 Units Subcutaneous QHS  . insulin aspart  4 Units Subcutaneous TID WC  . insulin glargine  15 Units Subcutaneous QHS  . metoprolol succinate  50 mg Oral Daily  . midazolam  2 mg Intravenous Once  . multivitamin  1 tablet Oral QHS  . multivitamin with minerals  1 tablet Oral Daily  . oxyCODONE  15 mg Oral Q12H  . pantoprazole  40 mg Oral Daily  . potassium chloride  20 mEq Oral Daily  . sertraline  100 mg Oral Daily    Time spent on care of this patient: 35 mins   MCCLUNG,JEFFREY T , MD   [facility] Office  [phone] Pager - Text Page per Shea Evans as per below:  On-Call/Text Page:      [email or web address]      password [email or web address]  If 7PM-7AM, please contact night-coverage [email or web address] Password [email or web address] 11/09/2015, 10:35 AM   LOS: 2 days

## 2015-11-09 NOTE — Care Management Important Message (Signed)
Important Message  Patient Details  Name: Brandy Houston MRN: [id number] Date of Birth: [birth year]   Medicare Important Message Given:  Yes    Nathen May 11/09/2015, 1:24 PM

## 2015-11-10 DIAGNOSIS — E118 Type 2 diabetes mellitus with unspecified complications: Secondary | ICD-10-CM

## 2015-11-10 LAB — GLUCOSE, CAPILLARY
Glucose-Capillary: 114 mg/dL — ABNORMAL HIGH (ref 65–99)
Glucose-Capillary: 177 mg/dL — ABNORMAL HIGH (ref 65–99)
Glucose-Capillary: 270 mg/dL — ABNORMAL HIGH (ref 65–99)

## 2015-11-10 LAB — CBC
HCT: 28.6 % — ABNORMAL LOW (ref 36.0–46.0)
Hemoglobin: 8.9 g/dL — ABNORMAL LOW (ref 12.0–15.0)
MCH: 28.2 pg (ref 26.0–34.0)
MCHC: 31.1 g/dL (ref 30.0–36.0)
MCV: 90.5 fL (ref 78.0–100.0)
Platelets: 194 10*3/uL (ref 150–400)
RBC: 3.16 MIL/uL — ABNORMAL LOW (ref 3.87–5.11)
RDW: 17.7 % — ABNORMAL HIGH (ref 11.5–15.5)
WBC: 10.1 10*3/uL (ref 4.0–10.5)

## 2015-11-10 LAB — HEMOGLOBIN A1C
Hgb A1c MFr Bld: 6.4 % — ABNORMAL HIGH (ref 4.8–5.6)
Mean Plasma Glucose: 137 mg/dL

## 2015-11-10 MED ORDER — FUROSEMIDE 40 MG PO TABS
120.0000 mg | ORAL_TABLET | Freq: Three times a day (TID) | ORAL | Status: DC
  Administered 2015-11-10 – 2015-11-13 (×9): 120 mg via ORAL
  Filled 2015-11-10 (×9): qty 3

## 2015-11-10 MED ORDER — SODIUM CHLORIDE 0.9% FLUSH
3.0000 mL | Freq: Two times a day (BID) | INTRAVENOUS | Status: DC
  Administered 2015-11-10 – 2015-11-12 (×6): 3 mL via INTRAVENOUS

## 2015-11-10 MED ORDER — SODIUM CHLORIDE 0.9% FLUSH: 3.0000 mL | INTRAVENOUS | Status: DC | PRN

## 2015-11-10 NOTE — Progress Notes (Signed)
Physical Therapy Treatment Patient Details Name: Brandy Houston MRN: [id number] DOB: 08-Nov-1955 Today's Date: 11/10/2015    History of Present Illness Pt is a 60 yo female admitted for a L TKA.  Pt with acute blood loss anemia post op with Hgb of 6.7.  Pt received 1 of  2 units of blood before evaluation.Pt with significant PMHx of HTN, peripheral neuropathy, hepatitis C, DM, depression, anxiety, septic arthritis, anemia, chronic kidney disease, bil shoulder arthroscopy, and bil knee arthroscopy.    PT Comments    Patient making progress towards PT goals today. Placing more weight through LLE and requiring less assist for standing and hop to mobility using RW. Patient still sith significant deficits in LLE strength (quad lag present) utilizing KI for all aspects of mobility to prevent buckling. Will continue to see and progress as tolerated.  Follow Up Recommendations  SNF     Equipment Recommendations  None recommended by PT    Recommendations for Other Services       Precautions / Restrictions Precautions Precautions: Fall Restrictions Weight Bearing Restrictions: No LLE Weight Bearing: Weight bearing as tolerated    Mobility  Bed Mobility Overal bed mobility: Needs Assistance Bed Mobility: Supine to Sit     Supine to sit: Min assist;HOB elevated     General bed mobility comments: min assist for LLE movement to EOB and control descent to floor  Transfers Overall transfer level: Needs assistance Equipment used: Rolling walker (2 wheeled) Transfers: Sit to/from Stand Sit to Stand: Min assist         General transfer comment: Min assist to power up to standing with elevated bed surface. Assist to position LLE when descending to sitting position on bed as well as in chair.  Ambulation/Gait Ambulation/Gait assistance: Min guard Ambulation Distance (Feet): 14 Feet Assistive device: Rolling walker (2 wheeled) Gait Pattern/deviations: Step-to pattern;Antalgic      General Gait Details: Improved ability to bear weight through LLE with KI donned. Cued for sequencing with RW and step to technique. Patient still with increased pain in LLE during mobility.    Stairs            Wheelchair Mobility    Modified Rankin (Stroke Patients Only)       Balance     Sitting balance-Leahy Scale: Good       Standing balance-Leahy Scale: Poor                      Cognition Arousal/Alertness: Awake/alert Behavior During Therapy: WFL for tasks assessed/performed Overall Cognitive Status: Within Functional Limits for tasks assessed                      Exercises Total Joint Exercises Ankle Circles/Pumps: AROM;Both;10 reps Quad Sets: AROM;Left;10 reps Hip ABduction/ADduction: AAROM;Left;10 reps Straight Leg Raises: AAROM;Left;10 reps (significant pain with this activity) Long Arc Quad: AROM;Left;10 reps (with KI on and elevating 1-2 inch from floor)    General Comments        Pertinent Vitals/Pain Pain Assessment: 0-10 Pain Score: 6  Pain Location: left knee Pain Descriptors / Indicators: Aching;Grimacing;Guarding Pain Intervention(s): Limited activity within patient's tolerance;Monitored during session;Repositioned    Home Living                      Prior Function            PT Goals (current goals can now be found in the care plan section) Acute  Rehab PT Goals Patient Stated Goal: to not have so much pain. PT Goal Formulation: With patient Time For Goal Achievement: 11/22/15 Potential to Achieve Goals: Good Progress towards PT goals: Progressing toward goals    Frequency  7X/week    PT Plan Current plan remains appropriate    Co-evaluation             End of Session Equipment Utilized During Treatment: Gait belt;Left knee immobilizer Activity Tolerance: Patient limited by pain Patient left: in chair;with call bell/phone within reach;with chair alarm set     Time: 1019-1040 PT Time  Calculation (min) (ACUTE ONLY): 21 min  Charges:  $Therapeutic Exercise: 8-22 mins                    G CodesDuncan Dull 15-Nov-2015, 11:16 AM Alben Deeds, PT DPT  [phone]

## 2015-11-10 NOTE — Progress Notes (Signed)
Subjective: Pt stable and comfortable   Objective: Vital signs in last 24 hours: Temp:  [98.3 F (36.8 C)-98.5 F (36.9 C)] 98.5 F (36.9 C) (05/27 0415) Pulse Rate:  [71-75] 73 (05/27 0415) Resp:  [11-20] 17 (05/27 0415) BP: (112-140)/(49-73) 140/59 mmHg (05/27 0415) SpO2:  [88 %-98 %] 92 % (05/27 0415) Weight:  [76.7 kg (169 lb 1.5 oz)] 76.7 kg (169 lb 1.5 oz) (05/27 0415)  Intake/Output from previous day: 05/26 0701 - 05/27 0700 In: -  Out: 2025 [Urine:2025] Intake/Output this shift:    Exam:  Sensation intact distally Dorsiflexion/Plantar flexion intact  Labs:  Recent Labs  11/07/15 1932 11/08/15 0150 11/08/15 0600 11/08/15 1612 11/09/15 0540  HGB 7.6* 7.1* 6.7* 9.0* 8.5*    Recent Labs  11/08/15 1612 11/09/15 0540  WBC 10.9* 12.9*  RBC 3.17* 2.96*  HCT 27.2* 25.8*  PLT 136* 144*    Recent Labs  11/07/15 0930 11/08/15 0150 11/08/15 0600  NA 140  --  139  K 3.7  --  4.1  CL  --   --  101  CO2  --   --  30  BUN  --   --  44*  CREATININE  --  2.38* 2.52*  GLUCOSE 146*  --  150*  CALCIUM  --   --  7.6*    Recent Labs  11/07/15 1440 11/08/15 0600  INR 1.51* 1.29    Assessment/Plan: Pt looks good this am - has been on cpm - hgb ok - thx to [facility] for consult   Brandy Houston 11/10/2015, 8:22 AM

## 2015-11-10 NOTE — Consult Note (Signed)
[facility] TEAM 1 - Stepdown/ICU TEAM CONSULT F/U NOTE  NATAVIA GROTH [id number] DOB: 05-03-1956 DOA: 11/07/2015 PCP: Lucrezia Starch, MD  Admit HPI / Brief Narrative: 60 y.o. female with a history of HTN, DM, recently treated HCV, CKD (required temporary hemodialysis last year for deterioration in renal function associated with septic arthritis) who was admitted for and underwent Left total knee arthroplasty on 5/24.  Intraoperatively she lost a significant amount of blood. Work up for DIC was inconclusive. No prior history of clotting problems per patient.   HPI/Subjective: No complaints this morning.  Denies sob, cp, n/v, or abdom pain.  In good spirits.    Recommendations/Plan:  Left TKA -per Ortho (primary service) - appears to be progressing well - medically stable for transfer to ortho floor   Acute blood loss anemia -due to surgery - Hgb stable at present - recheck today pending - f/u in AM as well   Recent Labs Lab 11/07/15 1440 11/07/15 1932 11/08/15 0150 11/08/15 0600 11/08/15 1612 11/09/15 0540  HGB 8.3* 7.6* 7.1* 6.7* 9.0* 8.5*    Chronic diastolic CHF -TTE Feb [id number] noted EF 50-55% no WMA and grade 2 DD -net + 1/2L since admit - slowly titrate lasix back toward home dose as intake increases - no exam findings presently to suggest signif volume overload  Filed Weights   11/08/15 1714 11/09/15 0314 11/10/15 0415  Weight: 79.3 kg (174 lb 13.2 oz) 79.4 kg (175 lb 0.7 oz) 76.7 kg (169 lb 1.5 oz)    CKD stage IV -baseline Cr~2-3 - stable at present - fistula in L forearm w/ good thrill and bruit   Recent Labs Lab 11/08/15 0150 11/08/15 0600  CREATININE 2.38* 2.52*    Hypertension -BP reasonably well controlled   DM2 -A1c 7.3 10/29/15 - hypoglycemia resolved - follow w/o change in tx today  Peripheral neuropathy -Gabapentin 100 mg BID  Anxiety/Depression -Zoloft 100 mg daily  Code Status: FULL Family Communication: no family present at time  of exam  Antibiotics: None presently    DVT prophylaxis: lovenox   Objective: Blood pressure 140/59, pulse 73, temperature 98.6 F (37 C), temperature source Oral, resp. rate 17, height 5\' 7"  (1.702 m), weight 76.7 kg (169 lb 1.5 oz), last menstrual period 03/16/2002, SpO2 92 %.  Intake/Output Summary (Last 24 hours) at 11/10/15 1006 Last data filed at 11/10/15 0900  Gross per 24 hour  Intake      0 ml  Output   2450 ml  Net  -2450 ml   Exam: General: No acute respiratory distress - alert  Lungs: Clear to auscultation bilaterally - no wheezes or crackles Cardiovascular: Regular rate and rhythm - 2/6 systolic M Abdomen: Nontender, nondistended, soft, bowel sounds positive, no rebound Extremities: No significant cyanosis, clubbing, edema bilateral lower extremities  Data Reviewed: Basic Metabolic Panel:  Recent Labs Lab 11/07/15 0930 11/08/15 0150 11/08/15 0600 11/09/15 0540  NA 140  --  139  --   K 3.7  --  4.1  --   CL  --   --  101  --   CO2  --   --  30  --   GLUCOSE 146*  --  150*  --   BUN  --   --  44*  --   CREATININE  --  2.38* 2.52*  --   CALCIUM  --   --  7.6*  --   MG  --   --   --  2.0  CBC:  Recent Labs Lab 11/07/15 1932 11/08/15 0150 11/08/15 0600 11/08/15 1612 11/09/15 0540  WBC 9.5 9.1 9.2 10.9* 12.9*  HGB 7.6* 7.1* 6.7* 9.0* 8.5*  HCT 22.6* 21.4* 20.5* 27.2* 25.8*  MCV 88.6 88.4 91.1 85.8 87.2  PLT 145* 123* 129* 136* 144*    Liver Function Tests: No results for input(s): AST, ALT, ALKPHOS, BILITOT, PROT, ALBUMIN in the last 168 hours. No results for input(s): LIPASE, AMYLASE in the last 168 hours. No results for input(s): AMMONIA in the last 168 hours.  Coags:  Recent Labs Lab 11/07/15 1440 11/08/15 0600  INR 1.51* 1.29    Recent Labs Lab 11/07/15 1440  APTT 38*   CBG:  Recent Labs Lab 11/09/15 0944 11/09/15 1247 11/09/15 1716 11/09/15 2131 11/10/15 0738  GLUCAP 114* 133* 185* 165* 114*    Recent Results  (from the past 240 hour(s))  Aerobic/Anaerobic Culture(surg specimen) (NOT AT [facility])     Status: None (Preliminary result)   Collection Time: 11/07/15 10:27 AM  Result Value Ref Range Status   Specimen Description TISSUE LEFT KNEE  Final   Special Requests SYNOVIUM  Final   Gram Stain   Final    FEW WBC PRESENT,BOTH PMN AND MONONUCLEAR NO ORGANISMS SEEN    Culture NO GROWTH 2 DAYS  Final   Report Status PENDING  Incomplete     Studies:   Recent x-ray studies have been reviewed in detail by the Attending Physician  Scheduled Meds:  Scheduled Meds: . enoxaparin (LOVENOX) injection  30 mg Subcutaneous Q24H  . fentaNYL (SUBLIMAZE) injection  100 mcg Intravenous Once  . furosemide  80 mg Oral TID  . gabapentin  100 mg Oral BID  . hydrALAZINE  100 mg Oral BID  . insulin aspart  0-15 Units Subcutaneous TID WC  . insulin aspart  0-5 Units Subcutaneous QHS  . insulin glargine  15 Units Subcutaneous QHS  . metoprolol succinate  50 mg Oral Daily  . multivitamin  1 tablet Oral QHS  . multivitamin with minerals  1 tablet Oral Daily  . nicotine  21 mg Transdermal Daily  . oxyCODONE  15 mg Oral Q12H  . pantoprazole  40 mg Oral Daily  . potassium chloride  20 mEq Oral Daily  . sertraline  100 mg Oral Daily    Time spent on care of this patient: 35 mins   MCCLUNG,JEFFREY T , MD   [facility] Office  [phone] Pager - Text Page per Shea Evans as per below:  On-Call/Text Page:      [email or web address]      password [email or web address]  If 7PM-7AM, please contact night-coverage [email or web address] Password [email or web address] 11/10/2015, 10:06 AM   LOS: 3 days

## 2015-11-10 NOTE — Care Management Note (Signed)
Case Management Note  Patient Details  Name: LEESHA KYZER MRN: [id number] Date of Birth: March 04, 1956  Subjective/Objective:    Patient is s/p TKR, with ABLA, received transfusion, per pt eval rec SNV, CSW following.                Action/Plan:   Expected Discharge Date:                  Expected Discharge Plan:  Skilled Nursing Facility  In-House Referral:  Clinical Social Work  Discharge planning Services     Post Acute Care Choice:    Choice offered to:     DME Arranged:    DME Agency:     HH Arranged:    [place] Agency:     Status of Service:  In process, will continue to follow  Medicare Important Message Given:  Yes Date Medicare IM Given:    Medicare IM give by:    Date Additional Medicare IM Given:    Additional Medicare Important Message give by:     If discussed at [place] of Stay Meetings, dates discussed:    Additional Comments:  Zenon Mayo, RN 11/10/2015, 8:16 AM

## 2015-11-11 LAB — GLUCOSE, CAPILLARY
Glucose-Capillary: 177 mg/dL — ABNORMAL HIGH (ref 65–99)
Glucose-Capillary: 160 mg/dL — ABNORMAL HIGH (ref 65–99)
Glucose-Capillary: 143 mg/dL — ABNORMAL HIGH (ref 65–99)
Glucose-Capillary: 206 mg/dL — ABNORMAL HIGH (ref 65–99)

## 2015-11-11 LAB — BASIC METABOLIC PANEL
Anion gap: 9 (ref 5–15)
BUN: 55 mg/dL — ABNORMAL HIGH (ref 6–20)
CO2: 29 mmol/L (ref 22–32)
Calcium: 8.9 mg/dL (ref 8.9–10.3)
Chloride: 98 mmol/L — ABNORMAL LOW (ref 101–111)
Creatinine, Ser: 2.6 mg/dL — ABNORMAL HIGH (ref 0.44–1.00)
GFR calc Af Amer: 22 mL/min — ABNORMAL LOW (ref >60)
GFR calc non Af Amer: 19 mL/min — ABNORMAL LOW (ref >60)
Glucose, Bld: 146 mg/dL — ABNORMAL HIGH (ref 65–99)
Potassium: 4.9 mmol/L (ref 3.5–5.1)
Sodium: 136 mmol/L (ref 135–145)

## 2015-11-11 LAB — CBC
HCT: 29.2 % — ABNORMAL LOW (ref 36.0–46.0)
Hemoglobin: 9.2 g/dL — ABNORMAL LOW (ref 12.0–15.0)
MCH: 28.7 pg (ref 26.0–34.0)
MCHC: 31.5 g/dL (ref 30.0–36.0)
MCV: 91 fL (ref 78.0–100.0)
Platelets: 195 10*3/uL (ref 150–400)
RBC: 3.21 MIL/uL — ABNORMAL LOW (ref 3.87–5.11)
RDW: 17.4 % — ABNORMAL HIGH (ref 11.5–15.5)
WBC: 8.9 10*3/uL (ref 4.0–10.5)

## 2015-11-11 NOTE — Progress Notes (Signed)
[name], female   DOB: 1955/12/31, 60 y.o.   MRN: [id number] Patient without complaints this morning. Plan for discharge to skilled nursing facility. Discharge when bed available.

## 2015-11-11 NOTE — Clinical Social Work Note (Signed)
Clinical Social Work Assessment  Patient Details  Name: Brandy Houston MRN: [id number] Date of Birth: 1955-11-20  Date of referral:  11/11/15               Reason for consult:  Facility Placement, Discharge Planning                Permission sought to share information with:  Family Supports, Customer service manager, Case Optician, dispensing granted to share information::  Yes, Verbal Permission Granted  Name::      Wyvonne Lenz )  Agency::   (SNF's )  Relationship::   (Son)  Contact Information:   [phone])  Housing/Transportation Living arrangements for the past 2 months:  Single Family Home Source of Information:  Patient, Adult Children Patient Interpreter Needed:  None Criminal Activity/Legal Involvement Pertinent to Current Situation/Hospitalization:  No - Comment as needed Significant Relationships:  Adult Children Lives with:  Adult Children Do you feel safe going back to the place where you live?  Yes Need for family participation in patient care:  Yes (Comment)  Care giving concerns: Left knee osteoarthritis. PT currently recommending short-term rehab at skilled level.    Social Worker assessment / plan:  Holiday representative met with patient at bedside in regards to post-acute placement at SNF. CSW introduced CSW role and SNF process. CSW also explained and provided SNF list at bedside. Patient stated that she is aware of PT recommendations however unfamiliar with facilities in this area. Pt stated she recently moved to this area with her son. Pt stated that currently she is from home with her son. Pt requested for CSW to contact her son to finalize discharge plans.   CSW contacted pt's son, Raquel Sarna to present SNF process. Pt's son is agreeable with SNF placement and stated he will review list with patient at bedside. Pt's son pleasant and appreciated social work intervention. No further concerns/questions presented by patient or family. CSW to complete FL-2 and fax  referral via [place]. CSW will continue to follow pt and pt's family for continued support and to facilitate pt's d/c needs once medically stable.   Employment status:  Retired Forensic scientist:  Information systems manager, Medicaid In [place] PT Recommendations:  [place] / Referral to community resources:  [place]  Patient/Family's Response to care:  Pt a/o x4. Pt sitting at bedside doing crossword puzzle. Pt and family agreeable to SNF placement. Pt's son involved in care and supportive of SNF transition. Pt and son pleasant and appreciated social work intervention.   Patient/Family's Understanding of and Emotional Response to Diagnosis, Current Treatment, and Prognosis:  Pt's son knowledgeable of medical interventions and post-acute discharge plans.   Emotional Assessment Appearance:  Appears younger than stated age, Well-Groomed Attitude/Demeanor/Rapport:   (Pleasant ) Affect (typically observed):  Accepting, Adaptable, Appropriate, Pleasant Orientation:  Oriented to Situation, Oriented to  Time, Oriented to Place, Oriented to Self Alcohol / Substance use:  Not Applicable Psych involvement (Current and /or in the community):  No (Comment)  Discharge Needs  Concerns to be addressed:  Care Coordination Readmission within the last 30 days:  No Current discharge risk:  Dependent with Mobility Barriers to Discharge:  Continued Medical Work up   [employer], MSW, LCSWA [phone] 11/11/2015 3:03 PM

## 2015-11-11 NOTE — Progress Notes (Signed)
Physical Therapy Treatment Patient Details Name: Brandy Houston MRN: [id number] DOB: 1955/09/02 Today's Date: 11/11/2015    History of Present Illness Pt is a 60 yo female admitted for a L TKA.  Pt with acute blood loss anemia post op with Hgb of 6.7.  Pt received 1 of  2 units of blood before evaluation.Pt with significant PMHx of HTN, peripheral neuropathy, hepatitis C, DM, depression, anxiety, septic arthritis, anemia, chronic kidney disease, bil shoulder arthroscopy, and bil knee arthroscopy.    PT Comments    Pt continues to make steady progress with mobility. Still has weak left quad activation, therefore continued to use KI with gait. Acute PT to continue during pt's hospital stay working on increased strengthening and mobility.   Follow Up Recommendations  SNF     Equipment Recommendations  None recommended by PT    Precautions / Restrictions Precautions Precautions: Fall Required Braces or Orthoses: Knee Immobilizer - Left Knee Immobilizer - Left: On when out of bed or walking (used with gait) Restrictions LLE Weight Bearing: Weight bearing as tolerated    Mobility  Bed Mobility   Bed Mobility: Supine to Sit     Supine to sit: Min assist     General bed mobility comments: bed flat, rail used. pt able to get both legs and self to edge of bed with supervision only, assist needed for left leg control/support with lowering off edge of bed to floor.  Transfers Overall transfer level: Needs assistance Equipment used: Rolling walker (2 wheeled) Transfers: Sit to/from Stand Sit to Stand: Min assist         General transfer comment: cues on hand placement and left leg placement with transfers;assist needed to power up into standing from elevated bed.  Ambulation/Gait Ambulation/Gait assistance: Min guard Ambulation Distance (Feet): 8 Feet Assistive device: Rolling walker (2 wheeled) Gait Pattern/deviations: Step-to pattern;Antalgic;Trunk flexed;Decreased step  length - right;Decreased stance time - left Gait velocity: decreased Gait velocity interpretation: Below normal speed for age/gender         Cognition Arousal/Alertness: Awake/alert Behavior During Therapy: WFL for tasks assessed/performed Overall Cognitive Status: Within Functional Limits for tasks assessed          Exercises Total Joint Exercises Ankle Circles/Pumps: AROM;Both;10 reps;Supine;Limitations Ankle Circles/Pumps Limitations: cues for full ankle motion vs just toe's Quad Sets: AROM;Strengthening;Left;10 reps;Supine Heel Slides: AAROM;Strengthening;Left;10 reps;Supine Straight Leg Raises: AAROM;Strengthening;Left;10 reps;Supine     Pertinent Vitals/Pain Pain Assessment: 0-10 Pain Score: 7  Pain Location: left knee Pain Descriptors / Indicators: Aching;Sore;Grimacing Pain Intervention(s): Limited activity within patient's tolerance;Monitored during session;Premedicated before session;Repositioned;Patient requesting pain meds-RN notified     PT Goals (current goals can now be found in the care plan section) Acute Rehab PT Goals Patient Stated Goal: to not have so much pain. PT Goal Formulation: With patient Time For Goal Achievement: 11/22/15 Potential to Achieve Goals: Good Progress towards PT goals: Progressing toward goals    Frequency  7X/week    PT Plan Current plan remains appropriate    End of Session Equipment Utilized During Treatment: Gait belt;Left knee immobilizer Activity Tolerance: Patient tolerated treatment well;Patient limited by fatigue;Patient limited by pain Patient left: in chair;with call bell/phone within reach;with nursing/sitter in room     Time: 1250-1308 PT Time Calculation (min) (ACUTE ONLY): 18 min  Charges:  $Therapeutic Exercise: 8-22 mins           Willow Ora 11/11/2015, 1:13 PM  Willow Ora, PTA, CLT Acute Rehab Services [phone] 11/11/2015, 1:14 PM

## 2015-11-11 NOTE — Clinical Social Work Placement (Signed)
   CLINICAL SOCIAL WORK PLACEMENT  NOTE  Date:  11/11/2015  Patient Details  Name: Brandy Houston MRN: [id number] Date of Birth: 1955-10-21  Clinical Social Work is seeking post-discharge placement for this patient at the [place] level of care (*CSW will initial, date and re-position this form in  chart as items are completed):  Yes   Patient/family provided with [place] Work Department's list of facilities offering this level of care within the geographic area requested by the patient (or if unable, by the patient's family).  Yes   Patient/family informed of their freedom to choose among providers that offer the needed level of care, that participate in Medicare, Medicaid or managed care program needed by the patient, have an available bed and are willing to accept the patient.  Yes   Patient/family informed of [facility]'s ownership interest in [facility] and [facility], as well as of the fact that they are under no obligation to receive care at these facilities.  PASRR submitted to EDS on 11/11/15     PASRR number received on 11/11/15     Existing PASRR number confirmed on       FL2 transmitted to all facilities in geographic area requested by pt/family on 11/11/15     FL2 transmitted to all facilities within larger geographic area on       Patient informed that his/her managed care company has contracts with or will negotiate with certain facilities, including the following:            Patient/family informed of bed offers received.  Patient chooses bed at       Physician recommends and patient chooses bed at      Patient to be transferred to   on  .  Patient to be transferred to facility by       Patient family notified on   of transfer.  Name of family member notified:        PHYSICIAN Please sign FL2     Additional Comment:    _______________________________________________ Rozell Searing, LCSW 11/11/2015, 3:10  PM

## 2015-11-11 NOTE — Progress Notes (Signed)
Chart reviewed. VSS. Hemoglobin improved from last check. Stable for discharge from my standpoint.   Hayden Mabin, [employer]

## 2015-11-11 NOTE — NC FL2 (Signed)
[place] LEVEL OF CARE SCREENING TOOL     IDENTIFICATION  Patient Name: Brandy Houston Birthdate: Sep 24, 1955 Sex: female Admission Date (Current Location): 11/07/2015  [facility] and [place] Number:  Herbalist and Address:  [facility], [place] [address] [place], [place] [address]      Provider Number: [id number]  Attending Physician Name and Address:  Leandrew Koyanagi, MD  Relative Name and Phone Number:       Current Level of Care: Hospital Recommended Level of Care: [facility] Prior Approval Number:    Date Approved/Denied:   PASRR Number: [id number]  Discharge Plan: SNF    Current Diagnoses: Patient Active Problem List   Diagnosis Date Noted  . Chronic diastolic CHF (congestive heart failure) ([place]) 11/08/2015  . Uncontrolled type 2 diabetes mellitus with complication ([place])   . Status post total left knee replacement   . Essential hypertension   . Acute blood loss anemia   . Diabetic peripheral neuropathy ([place])   . Anxiety state   . Depression   . Knee joint replacement status 11/07/2015  . Total knee replacement status 11/07/2015  . Normocytic anemia 11/07/2015  . Diabetes mellitus with complication ([place])   . Chronic kidney disease (CKD), stage IV (severe) ([place])   . Postoperative surgical complication involving circulatory system   . Physical debility 09/14/2014  . Acute renal failure syndrome ([place])   . Hep C w/ coma, chronic ([place])   . Joint infection ([place])   . Acute renal failure ([place])   . Hepatitis C 08/16/2014  . SIRS (systemic inflammatory response syndrome) ([name])   . Benign essential HTN   . Septic joint of right hand ([name])   . Staphylococcus aureus bacteremia   . Septic joint of right knee joint ([place])   . ESRD (end stage renal disease) on dialysis ([place])   . Tachycardia 08/07/2014  . Septic joint of left shoulder region ([place]) 08/07/2014  . Sinus tachycardia ([place]) 08/07/2014  . BP (high blood  pressure) 06/24/2013  . Disorder of peripheral nervous system ([place]) 06/24/2013  . 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 Diabetes mellitus, type 2 ([place]) 06/24/2013  . Uncontrolled hypertension 02/18/2013  . Type II or unspecified type diabetes mellitus without mention of complication, uncontrolled 02/18/2013  . Chest pain at rest 10/18/2011    Class: Acute  . Shortness of breath 10/18/2011    Class: Acute    Orientation RESPIRATION BLADDER Height & Weight     Self, Time, Situation, Place  Normal Continent Weight: 169 lb 12.1 oz (77 kg) Height:  5\' 7"  (170.2 cm)  BEHAVIORAL SYMPTOMS/MOOD NEUROLOGICAL BOWEL NUTRITION STATUS   (NONE )  (NONE ) Continent Diet (CARB MODIFIED )  AMBULATORY STATUS COMMUNICATION OF NEEDS Skin   Limited Assist Verbally Surgical wounds (L. Knee)                       Personal Care Assistance Level of Assistance  Bathing, Feeding, Dressing Bathing Assistance: Limited assistance Feeding Assistance: Independent  Dressing Assistance: Limited assistance     Functional Limitations Info  Sight, Hearing, Speech Sight Info: Adequate Hearing Info: Adequate Speech Info: Adequate    SPECIAL CARE FACTORS FREQUENCY  PT (By licensed PT), OT (By licensed OT)     PT Frequency: 7 OT Frequency: 2            Contractures      Additional Factors Info  Code  Status, Insulin Sliding Scale, Allergies Code Status Info: FULL CODE  Allergies Info: Compazine, Shellfish-derived Products, Iodinated Diagnostic Agents, Omnipaque   Insulin Sliding Scale Info: 3       Current Medications (11/11/2015):  This is the current hospital active medication list Current Facility-Administered Medications  Medication Dose Route Frequency Provider Last Rate Last Dose  . 0.9 %  sodium chloride infusion   Intravenous Continuous Josephine Igo, MD 10 mL/hr at 11/07/15 1021    . acetaminophen (TYLENOL) tablet 650 mg  650 mg Oral Q6H PRN Leandrew Koyanagi, MD   650 mg at  11/11/15 0500   Or  . acetaminophen (TYLENOL) suppository 650 mg  650 mg Rectal Q6H PRN Naiping Ephriam Jenkins, MD      . alum & mag hydroxide-simeth (MAALOX/MYLANTA) 200-200-20 MG/5ML suspension 30 mL  30 mL Oral Q4H PRN Naiping Ephriam Jenkins, MD      . diphenhydrAMINE (BENADRYL) 12.5 MG/5ML elixir 25 mg  25 mg Oral Q4H PRN Leandrew Koyanagi, MD   25 mg at 11/09/15 2319  . enoxaparin (LOVENOX) injection 30 mg  30 mg Subcutaneous Q24H Leandrew Koyanagi, MD   30 mg at 11/11/15 [id number]  . furosemide (LASIX) tablet 120 mg  120 mg Oral TID Cherene Altes, MD   120 mg at 11/11/15 0925  . gabapentin (NEURONTIN) capsule 100 mg  100 mg Oral BID Leandrew Koyanagi, MD   100 mg at 11/11/15 0925  . hydrALAZINE (APRESOLINE) tablet 100 mg  100 mg Oral BID Leandrew Koyanagi, MD   100 mg at 11/11/15 0925  . HYDROcodone-acetaminophen (NORCO) 10-325 MG per tablet 1 tablet  1 tablet Oral Q4H PRN Leandrew Koyanagi, MD   1 tablet at 11/09/15 0552  . insulin aspart (novoLOG) injection 0-15 Units  0-15 Units Subcutaneous TID WC Naiping Ephriam Jenkins, MD   3 Units at 11/11/15 1310  . insulin aspart (novoLOG) injection 0-5 Units  0-5 Units Subcutaneous QHS Leandrew Koyanagi, MD   3 Units at 11/08/15 2155  . insulin glargine (LANTUS) injection 15 Units  15 Units Subcutaneous QHS Leandrew Koyanagi, MD   15 Units at 11/09/15 2257  . magnesium citrate solution 1 Bottle  1 Bottle Oral Once PRN Naiping Ephriam Jenkins, MD      . menthol-cetylpyridinium (CEPACOL) lozenge 3 mg  1 lozenge Oral PRN Naiping Ephriam Jenkins, MD       Or  . phenol (CHLORASEPTIC) mouth spray 1 spray  1 spray Mouth/Throat PRN Naiping Ephriam Jenkins, MD      . methocarbamol (ROBAXIN) tablet 500 mg  500 mg Oral Q6H PRN Leandrew Koyanagi, MD   500 mg at 11/07/15 1703   Or  . methocarbamol (ROBAXIN) 500 mg in dextrose 5 % 50 mL IVPB  500 mg Intravenous Q6H PRN Leandrew Koyanagi, MD      . metoprolol succinate (TOPROL-XL) 24 hr tablet 50 mg  50 mg Oral Daily Naiping Ephriam Jenkins, MD   50 mg at 11/11/15 0925  . morphine 2 MG/ML injection 1 mg  1 mg Intravenous Q2H PRN  Leandrew Koyanagi, MD   1 mg at 11/11/15 1309  . multivitamin (RENA-VIT) tablet 1 tablet  1 tablet Oral QHS Leandrew Koyanagi, MD   1 tablet at 11/10/15 2104  . multivitamin with minerals tablet 1 tablet  1 tablet Oral Daily Naiping Ephriam Jenkins, MD   1 tablet at 11/11/15 0925  . nicotine (NICODERM CQ - dosed in mg/24  hours) patch 21 mg  21 mg Transdermal Daily Cherene Altes, MD   21 mg at 11/11/15 0926  . ondansetron (ZOFRAN) tablet 4 mg  4 mg Oral Q6H PRN Naiping Ephriam Jenkins, MD       Or  . ondansetron [facility]) injection 4 mg  4 mg Intravenous Q6H PRN Leandrew Koyanagi, MD   4 mg at 11/08/15 0819  . oxyCODONE (Oxy IR/ROXICODONE) immediate release tablet 5-15 mg  5-15 mg Oral Q3H PRN Leandrew Koyanagi, MD   15 mg at 11/11/15 0459  . oxyCODONE (OXYCONTIN) 12 hr tablet 15 mg  15 mg Oral Q12H Naiping Ephriam Jenkins, MD   15 mg at 11/11/15 0925  . pantoprazole (PROTONIX) EC tablet 40 mg  40 mg Oral Daily Naiping Ephriam Jenkins, MD   40 mg at 11/11/15 0925  . polyethylene glycol (MIRALAX / GLYCOLAX) packet 17 g  17 g Oral Daily PRN Naiping Ephriam Jenkins, MD      . potassium chloride SA (K-DUR,KLOR-CON) CR tablet 20 mEq  20 mEq Oral Daily Naiping Ephriam Jenkins, MD   20 mEq at 11/11/15 0925  . sertraline (ZOLOFT) tablet 100 mg  100 mg Oral Daily Naiping Ephriam Jenkins, MD   100 mg at 11/11/15 0926  . sodium chloride flush (NS) 0.9 % injection 3 mL  3 mL Intravenous Q12H Naiping Ephriam Jenkins, MD   3 mL at 11/10/15 2200  . sodium chloride flush (NS) 0.9 % injection 3 mL  3 mL Intravenous PRN Naiping Ephriam Jenkins, MD      . sorbitol 70 % solution 30 mL  30 mL Oral Daily PRN Naiping Ephriam Jenkins, MD         Discharge Medications: Please see discharge summary for a list of discharge medications.  Relevant Imaging Results:  Relevant Lab Results:   Additional Information SSN [id number]  Rozell Searing, LCSW

## 2015-11-12 LAB — GLUCOSE, CAPILLARY
Glucose-Capillary: 162 mg/dL — ABNORMAL HIGH (ref 65–99)
Glucose-Capillary: 180 mg/dL — ABNORMAL HIGH (ref 65–99)
Glucose-Capillary: 152 mg/dL — ABNORMAL HIGH (ref 65–99)
Glucose-Capillary: 195 mg/dL — ABNORMAL HIGH (ref 65–99)

## 2015-11-12 LAB — AEROBIC/ANAEROBIC CULTURE W GRAM STAIN (SURGICAL/DEEP WOUND): Culture: NO GROWTH

## 2015-11-12 NOTE — Progress Notes (Signed)
[name], female   DOB: 09-25-1955, 60 y.o.   MRN: [id number] Patient is alert oriented this morning and pain. She is using the CPM. The F L2 was signed and anticipate discharge to skilled nursing on Tuesday.

## 2015-11-12 NOTE — Clinical Social Work Note (Signed)
CSW spoke with patient regarding bed offers.  Patient deferred decision to her son, Raquel Sarna and asked that CSW contact him for decision as he is more familiar with the [place] area.  CSW attempted to contact son, Raquel Sarna to provide bed offers.  CSW left message and is currently awaiting a return call.  Nonnie Done, LCSW [phone]  2H 1-14; [place] Licensed Clinical Social Worker

## 2015-11-12 NOTE — Care Management Important Message (Signed)
Important Message  Patient Details  Name: Brandy Houston MRN: [id number] Date of Birth: 07/02/1955   Medicare Important Message Given:  Yes    Alencia Gordon P Sandro Burgo 11/12/2015, 10:00 AM

## 2015-11-12 NOTE — Progress Notes (Signed)
Orthopedic Tech Progress Note Patient Details:  Brandy Houston 1955-07-16 [id number]  CPM Left Knee CPM Left Knee: On Left Knee Flexion (Degrees): 40 Left Knee Extension (Degrees): 0   [name] 11/12/2015, 3:19 PM

## 2015-11-12 NOTE — Progress Notes (Signed)
Occupational Therapy Treatment Patient Details Name: Brandy Houston MRN: [id number] DOB: August 21, 1955 Today's Date: 11/12/2015    History of present illness Pt is a 60 yo female admitted for a L TKA.  Pt with acute blood loss anemia post op with Hgb of 6.7.  Pt received 1 of  2 units of blood before evaluation.Pt with significant PMHx of HTN, peripheral neuropathy, hepatitis C, DM, depression, anxiety, septic arthritis, anemia, chronic kidney disease, bil shoulder arthroscopy, and bil knee arthroscopy.   OT comments  Pt making progress toward goals. Continue to recommend rehab at [facility]. Family present for session and agrees with SNF.   Follow Up Recommendations  SNF;Supervision/Assistance - 24 hour    Equipment Recommendations  None recommended by OT    Recommendations for Other Services      Precautions / Restrictions Precautions Precautions: Fall Required Braces or Orthoses: Knee Immobilizer - Left Knee Immobilizer - Left: On when out of bed or walking Restrictions Weight Bearing Restrictions: Yes LLE Weight Bearing: Weight bearing as tolerated       Mobility Bed Mobility Overal bed mobility: Needs Assistance Bed Mobility: Supine to Sit;Sit to Supine     Supine to sit: Supervision Sit to supine: Min assist (to lift LLE)  Looping RLE under L to help mobilize LLE   General bed mobility comments:  Transfers Overall transfer level: Needs assistance Equipment used: Rolling walker (2 wheeled) Transfers: Sit to/from Stand Sit to Stand: Min assist Stand pivot transfers: Min assist       General transfer comment: good carry over of hand placement    Balance Overall balance assessment: Needs assistance Sitting-balance support: Feet supported;No upper extremity supported Sitting balance-Leahy Scale: Good     Standing balance support: Bilateral upper extremity supported;During functional activity Standing balance-Leahy Scale: Poor Standing balance comment: Reliant on RW  for support                   ADL                       Lower Body Dressing: Moderate assistance;Sit to/from stand   Toilet Transfer: Minimal assistance;BSC;Ambulation        Pt assisted with donning KI.             Vision                     Perception     Praxis      Cognition   Behavior During Therapy: WFL for tasks assessed/performed Overall Cognitive Status: Within Functional Limits for tasks assessed                       Extremity/Trunk Assessment               Exercises   Shoulder Instructions       General Comments      Pertinent Vitals/ Pain       Pain Assessment: 0-10 Pain Score: 6  Faces Pain Scale: Hurts even more Pain Location: L knee Pain Descriptors / Indicators: Aching Pain Intervention(s): Limited activity within patient's tolerance;Ice applied;Repositioned  Home Living                                          Prior Functioning/Environment  Frequency Min 2X/week     Progress Toward Goals  OT Goals(current goals can now be found in the care plan section)  Progress towards OT goals: Progressing toward goals  Acute Rehab OT Goals Patient Stated Goal: to not have so much pain. OT Goal Formulation: With patient Time For Goal Achievement: 11/22/15 Potential to Achieve Goals: Good ADL Goals Pt Will Perform Grooming: with min guard assist;standing Pt Will Perform Lower Body Bathing: with min assist;sit to/from stand Pt Will Perform Lower Body Dressing: with min assist;with adaptive equipment;sit to/from stand Pt Will Perform Tub/Shower Transfer: Shower transfer;with mod assist;ambulating;shower seat;rolling walker Additional ADL Goal #1: Pt will pivot to [facility] with walker and toilet with S.  Plan Discharge plan remains appropriate    Co-evaluation                 End of Session Equipment Utilized During Treatment: Gait belt;Rolling walker   Activity  Tolerance Patient tolerated treatment well   Patient Left in bed;with call bell/phone within reach;with family/visitor present   Nurse Communication Mobility status        Time: [id number] OT Time Calculation (min): 18 min  Charges: OT General Charges $OT Visit: 1 Procedure OT Treatments $Self Care/Home Management : 8-22 mins  Estie Sproule,HILLARY 11/12/2015, 3:16 PM   [facility], OTR/L  [phone] 11/12/2015

## 2015-11-12 NOTE — Progress Notes (Signed)
Physical Therapy Treatment Patient Details Name: Brandy Houston MRN: [id number] DOB: 1956-03-15 Today's Date: 11/12/2015    History of Present Illness Pt is a 60 yo female admitted for a L TKA.  Pt with acute blood loss anemia post op with Hgb of 6.7.  Pt received 1 of  2 units of blood before evaluation.Pt with significant PMHx of HTN, peripheral neuropathy, hepatitis C, DM, depression, anxiety, septic arthritis, anemia, chronic kidney disease, bil shoulder arthroscopy, and bil knee arthroscopy.    PT Comments    Pt progressing towards physical therapy goals. Was able to advance gait training distance with chair follow and hands-on guarding for safety throughout. Pt requires increased cues for sequencing/technique and continues to have difficulty with weight bearing on the LLE. Will continue to follow and progress as able per POC.   Follow Up Recommendations  SNF     Equipment Recommendations  None recommended by PT    Recommendations for Other Services       Precautions / Restrictions Precautions Precautions: Fall Required Braces or Orthoses: Knee Immobilizer - Left Knee Immobilizer - Left: On when out of bed or walking (Used with gait) Restrictions Weight Bearing Restrictions: Yes LLE Weight Bearing: Weight bearing as tolerated    Mobility  Bed Mobility Overal bed mobility: Needs Assistance Bed Mobility: Supine to Sit     Supine to sit: Min assist     General bed mobility comments: bed flat, rail used. pt able to get both legs and self to edge of bed with supervision only, assist needed for left leg control/support with lowering off edge of bed to floor.  Transfers Overall transfer level: Needs assistance Equipment used: Rolling walker (2 wheeled) Transfers: Sit to/from Stand Sit to Stand: Min assist         General transfer comment: VC's for hand placement on seated surface for safety. Pt was able to power-up to full standing with min assist from bed in lowest  height.   Ambulation/Gait Ambulation/Gait assistance: Min guard;+2 safety/equipment Ambulation Distance (Feet): 35 Feet Assistive device: Rolling walker (2 wheeled) Gait Pattern/deviations: Step-to pattern;Decreased stride length;Trunk flexed;Narrow base of support;Decreased weight shift to left;Decreased dorsiflexion - left;Decreased stance time - left;Decreased step length - left;Decreased step length - right Gait velocity: Decreased Gait velocity interpretation: Below normal speed for age/gender General Gait Details: VC's for sequencing and safety with the RW. Pt continues to have difficulty with heel strike and is limiting weight bearing through the L side.    Stairs            Wheelchair Mobility    Modified Rankin (Stroke Patients Only)       Balance Overall balance assessment: Needs assistance Sitting-balance support: Feet supported;No upper extremity supported Sitting balance-Leahy Scale: Good     Standing balance support: Bilateral upper extremity supported;During functional activity Standing balance-Leahy Scale: Poor Standing balance comment: Reliant on RW for support                    Cognition Arousal/Alertness: Awake/alert Behavior During Therapy: WFL for tasks assessed/performed Overall Cognitive Status: Within Functional Limits for tasks assessed                      Exercises Total Joint Exercises Quad Sets: 10 reps;Both Heel Slides: 10 reps;Seated Hip ABduction/ADduction: 10 reps Goniometric ROM: 65 flexion AAROM L knee    General Comments        Pertinent Vitals/Pain Pain Assessment: Faces Faces Pain Scale:  Hurts even more Pain Location: L knee during mobility Pain Descriptors / Indicators: Operative site guarding Pain Intervention(s): Limited activity within patient's tolerance;Monitored during session;Repositioned    Home Living                      Prior Function            PT Goals (current goals can  now be found in the care plan section) Acute Rehab PT Goals Patient Stated Goal: to not have so much pain. PT Goal Formulation: With patient Time For Goal Achievement: 11/22/15 Potential to Achieve Goals: Good Progress towards PT goals: Progressing toward goals    Frequency  7X/week    PT Plan Current plan remains appropriate    Co-evaluation             End of Session Equipment Utilized During Treatment: Gait belt;Left knee immobilizer Activity Tolerance: Patient limited by fatigue;Patient limited by pain Patient left: in chair;with call bell/phone within reach;with chair alarm set     Time: [id number] PT Time Calculation (min) (ACUTE ONLY): 32 min  Charges:  $Gait Training: 8-22 mins $Therapeutic Exercise: 8-22 mins                    G Codes:      Rolinda Roan 2015-12-01, 12:20 PM   Rolinda Roan, PT, DPT Acute Rehabilitation Services Pager: [phone]

## 2015-11-13 DIAGNOSIS — Z96652 Presence of left artificial knee joint: Secondary | ICD-10-CM | POA: Diagnosis not present

## 2015-11-13 DIAGNOSIS — N2581 Secondary hyperparathyroidism of renal origin: Secondary | ICD-10-CM | POA: Diagnosis not present

## 2015-11-13 DIAGNOSIS — D631 Anemia in chronic kidney disease: Secondary | ICD-10-CM | POA: Diagnosis not present

## 2015-11-13 DIAGNOSIS — M179 Osteoarthritis of knee, unspecified: Secondary | ICD-10-CM | POA: Diagnosis not present

## 2015-11-13 DIAGNOSIS — M6281 Muscle weakness (generalized): Secondary | ICD-10-CM | POA: Diagnosis not present

## 2015-11-13 DIAGNOSIS — I129 Hypertensive chronic kidney disease with stage 1 through stage 4 chronic kidney disease, or unspecified chronic kidney disease: Secondary | ICD-10-CM | POA: Diagnosis not present

## 2015-11-13 DIAGNOSIS — K219 Gastro-esophageal reflux disease without esophagitis: Secondary | ICD-10-CM | POA: Diagnosis not present

## 2015-11-13 DIAGNOSIS — D509 Iron deficiency anemia, unspecified: Secondary | ICD-10-CM | POA: Diagnosis not present

## 2015-11-13 DIAGNOSIS — K5901 Slow transit constipation: Secondary | ICD-10-CM | POA: Diagnosis not present

## 2015-11-13 DIAGNOSIS — M1712 Unilateral primary osteoarthritis, left knee: Secondary | ICD-10-CM | POA: Diagnosis not present

## 2015-11-13 DIAGNOSIS — G629 Polyneuropathy, unspecified: Secondary | ICD-10-CM | POA: Diagnosis not present

## 2015-11-13 DIAGNOSIS — D62 Acute posthemorrhagic anemia: Secondary | ICD-10-CM | POA: Diagnosis not present

## 2015-11-13 DIAGNOSIS — N184 Chronic kidney disease, stage 4 (severe): Secondary | ICD-10-CM | POA: Diagnosis not present

## 2015-11-13 DIAGNOSIS — Z471 Aftercare following joint replacement surgery: Secondary | ICD-10-CM | POA: Diagnosis not present

## 2015-11-13 DIAGNOSIS — I10 Essential (primary) hypertension: Secondary | ICD-10-CM | POA: Diagnosis not present

## 2015-11-13 DIAGNOSIS — E1129 Type 2 diabetes mellitus with other diabetic kidney complication: Secondary | ICD-10-CM | POA: Diagnosis not present

## 2015-11-13 DIAGNOSIS — E118 Type 2 diabetes mellitus with unspecified complications: Secondary | ICD-10-CM | POA: Diagnosis not present

## 2015-11-13 DIAGNOSIS — F329 Major depressive disorder, single episode, unspecified: Secondary | ICD-10-CM | POA: Diagnosis not present

## 2015-11-13 DIAGNOSIS — E1140 Type 2 diabetes mellitus with diabetic neuropathy, unspecified: Secondary | ICD-10-CM | POA: Diagnosis not present

## 2015-11-13 DIAGNOSIS — E1142 Type 2 diabetes mellitus with diabetic polyneuropathy: Secondary | ICD-10-CM | POA: Diagnosis not present

## 2015-11-13 DIAGNOSIS — E876 Hypokalemia: Secondary | ICD-10-CM | POA: Diagnosis not present

## 2015-11-13 DIAGNOSIS — R262 Difficulty in walking, not elsewhere classified: Secondary | ICD-10-CM | POA: Diagnosis not present

## 2015-11-13 DIAGNOSIS — E46 Unspecified protein-calorie malnutrition: Secondary | ICD-10-CM | POA: Diagnosis not present

## 2015-11-13 DIAGNOSIS — D649 Anemia, unspecified: Secondary | ICD-10-CM | POA: Diagnosis not present

## 2015-11-13 DIAGNOSIS — F419 Anxiety disorder, unspecified: Secondary | ICD-10-CM | POA: Diagnosis not present

## 2015-11-13 DIAGNOSIS — R2681 Unsteadiness on feet: Secondary | ICD-10-CM | POA: Diagnosis not present

## 2015-11-13 DIAGNOSIS — F17200 Nicotine dependence, unspecified, uncomplicated: Secondary | ICD-10-CM | POA: Diagnosis not present

## 2015-11-13 DIAGNOSIS — K5900 Constipation, unspecified: Secondary | ICD-10-CM | POA: Diagnosis not present

## 2015-11-13 DIAGNOSIS — I5032 Chronic diastolic (congestive) heart failure: Secondary | ICD-10-CM | POA: Diagnosis not present

## 2015-11-13 LAB — GLUCOSE, CAPILLARY
Glucose-Capillary: 143 mg/dL — ABNORMAL HIGH (ref 65–99)
Glucose-Capillary: 115 mg/dL — ABNORMAL HIGH (ref 65–99)
Glucose-Capillary: 151 mg/dL — ABNORMAL HIGH (ref 65–99)

## 2015-11-13 NOTE — Progress Notes (Signed)
Orthopedic Tech Progress Note Patient Details:  [name] June 20, 1955 [id number]  [name], female   DOB: [birth year], 60 y.o.   MRN: [id number] Applied cpm 0-40 Karolee Stamps 11/13/2015, 5:59 AM

## 2015-11-13 NOTE — Discharge Summary (Signed)
Physician Discharge Summary      [name] MRN: [id number] DOB/AGE: 1955/09/04 60 y.o.  Admit date: 11/07/2015 Discharge date: 11/13/2015  Admission Diagnoses:  <principal problem not specified>  Discharge Diagnoses:  Active Problems:   Knee joint replacement status   Total knee replacement status   Normocytic anemia   Diabetes mellitus with complication ([name])   Chronic kidney disease (CKD), stage IV (severe) ([name])   Postoperative surgical complication involving circulatory system   Chronic diastolic CHF (congestive heart failure) ([place])   Uncontrolled type 2 diabetes mellitus with complication ([name])   Status post total left knee replacement   Essential hypertension   Acute blood loss anemia   Diabetic peripheral neuropathy ([name])   Anxiety state   Depression   Past Medical History  Diagnosis Date  . Hypertension   . Peripheral neuropathy ([place])   . Hepatitis C 2016  . Diabetes mellitus     type 2, IDDM  . Depression   . Anxiety   . GERD (gastroesophageal reflux disease)   . Septic arthritis ([place])   . Dysrhythmia     tachycardia, normal ECHO 08-09-14  . Anemia   . Chronic kidney disease     stage IV. previous HD, none currently 10/30/15    Surgeries: Procedure(s): LEFT TOTAL KNEE ARTHROPLASTY WITH REVISION OF IMPLANTS on 11/07/2015   Consultants (if any): Treatment Team:  Ala Bent, MD  Discharged Condition: Improved  Hospital Course: Brandy Houston is an 60 y.o. female who was admitted 11/07/2015 with a diagnosis of <principal problem not specified> and went to the operating room on 11/07/2015 and underwent the above named procedures.    She was given perioperative antibiotics:  Anti-infectives    Start     Dose/Rate Route Frequency Ordered Stop   11/08/15 1300  vancomycin (VANCOCIN) IVPB 1000 mg/200 mL premix     1,000 mg 200 mL/hr over 60 Minutes Intravenous  Once 11/07/15 2034 11/08/15 1519   11/07/15 1318  vancomycin (VANCOCIN) powder   Status:  Discontinued       As needed 11/07/15 1414 11/07/15 1419   11/07/15 1318  gentamicin (GARAMYCIN) injection  Status:  Discontinued       As needed 11/07/15 1615 11/07/15 1616   11/07/15 1318  vancomycin (VANCOCIN) powder  Status:  Discontinued       As needed 11/07/15 1616 11/07/15 1616   11/07/15 1300  vancomycin (VANCOCIN) 1,000 mg in sodium chloride 0.9 % 250 mL IVPB  Status:  Discontinued     1,000 mg 250 mL/hr over 60 Minutes Intravenous Every 12 hours 11/07/15 1251 11/07/15 2114    .  She was given sequential compression devices, early ambulation, and lovenox for DVT prophylaxis.  She benefited maximally from the hospital stay and there were no complications.    Recent vital signs:  Filed Vitals:   11/12/15 2033 11/13/15 0443  BP: 136/56 132/59  Pulse: 69 69  Temp: 98.5 F (36.9 C) 98.6 F (37 C)  Resp: 18 18    Recent laboratory studies:  Lab Results  Component Value Date   HGB 9.2* 11/11/2015   HGB 8.9* 11/10/2015   HGB 8.5* 11/09/2015   Lab Results  Component Value Date   WBC 8.9 11/11/2015   PLT 195 11/11/2015   Lab Results  Component Value Date   INR 1.29 11/08/2015   Lab Results  Component Value Date   NA 136 11/11/2015   K 4.9 11/11/2015   CL 98* 11/11/2015  CO2 29 11/11/2015   BUN 55* 11/11/2015   CREATININE 2.60* 11/11/2015   GLUCOSE 146* 11/11/2015    Discharge Medications:     Medication List    TAKE these medications        ACCU-CHEK AVIVA PLUS w/Device Kit  Check sugars TID for E11.65     accu-chek softclix lancets  Check sugars TID for E11.65     acetaminophen 500 MG tablet  Commonly known as:  TYLENOL  Take 1,000 mg by mouth every 6 (six) hours as needed for mild pain.     aspirin 81 MG chewable tablet  Chew 81 mg by mouth daily.     calcium carbonate 750 MG chewable tablet  Commonly known as:  TUMS E-X 750  Chew 1 tablet (750 mg total) by mouth daily. With your largest meal     diphenhydrAMINE 25 mg capsule    Commonly known as:  BENADRYL  Take 1 capsule (25 mg total) by mouth every 6 (six) hours as needed for itching.     enoxaparin 30 MG/0.3ML injection  Commonly known as:  LOVENOX  Inject 0.3 mLs (30 mg total) into the skin daily.     furosemide 80 MG tablet  Commonly known as:  LASIX  Take 160 mg by mouth 3 (three) times daily.     gabapentin 100 MG capsule  Commonly known as:  NEURONTIN  Take 1 capsule (100 mg total) by mouth 2 (two) times daily.     glucose blood test strip  Commonly known as:  ACCU-CHEK AVIVA  Check sugars TID for E11.65     hydrALAZINE 100 MG tablet  Commonly known as:  APRESOLINE  Take 1 tablet by mouth 2 (two) times daily.     Insulin Glargine 100 UNIT/ML Solostar Pen  Commonly known as:  LANTUS SOLOSTAR  Inject 5 Units into the skin daily at 10 pm.     Insulin Syringe-Needle U-100 31G X 5/16" 0.5 ML Misc  Commonly known as:  BD INSULIN SYRINGE ULTRAFINE  Inject insulin once per night for E11.65     methocarbamol 750 MG tablet  Commonly known as:  ROBAXIN  Take 1 tablet (750 mg total) by mouth 2 (two) times daily as needed for muscle spasms.     metoprolol succinate 50 MG 24 hr tablet  Commonly known as:  TOPROL-XL  Take 1 tablet by mouth daily.     multivitamin Tabs tablet  Take 1 tablet by mouth at bedtime.     multivitamin with minerals Tabs tablet  Take 1 tablet by mouth daily.     omeprazole 20 MG capsule  Commonly known as:  PRILOSEC  Take 1 capsule by mouth every morning.     ondansetron 4 MG tablet  Commonly known as:  ZOFRAN  Take 1-2 tablets (4-8 mg total) by mouth every 8 (eight) hours as needed for nausea or vomiting.     oxyCODONE 5 MG immediate release tablet  Commonly known as:  Oxy IR/ROXICODONE  Take 1-3 tablets (5-15 mg total) by mouth every 4 (four) hours as needed.     oxyCODONE 10 mg 12 hr tablet  Commonly known as:  OXYCONTIN  Take 1 tablet (10 mg total) by mouth every 12 (twelve) hours.     Potassium Chloride  ER 20 MEQ Tbcr  Take 1 tablet by mouth daily.     senna-docusate 8.6-50 MG tablet  Commonly known as:  SENOKOT S  Take 1 tablet by mouth at bedtime as  needed.     sertraline 100 MG tablet  Commonly known as:  ZOLOFT  Take 1 tablet (100 mg total) by mouth daily.     traMADol 50 MG tablet  Commonly known as:  ULTRAM  Take 50 mg by mouth every 6 (six) hours as needed for moderate pain.        Diagnostic Studies: Dg Knee Left Port  11/07/2015  CLINICAL DATA:  Total knee replacement. EXAM: PORTABLE LEFT KNEE - 1-2 VIEW COMPARISON:  10/12/2015 FINDINGS: Total knee arthroplasty without periprosthetic fracture or subluxation. Antibiotic beads noted mainly in the suprapatellar joint space neighboring a drain. IMPRESSION: Total knee arthroplasty without acute finding. Electronically Signed   By: Monte Fantasia M.D.   On: 11/07/2015 14:42    Disposition: 01-Home or Self Care      Discharge Instructions    Call MD / Call 911    Complete by:  As directed   If you experience chest pain or shortness of breath, CALL 911 and be transported to the hospital emergency room.  If you develope a fever above 101.5 F, pus (white drainage) or increased drainage or redness at the wound, or calf pain, call your surgeon's office.     Constipation Prevention    Complete by:  As directed   Drink plenty of fluids.  Prune juice may be helpful.  You may use a stool softener, such as Colace (over the counter) 100 mg twice a day.  Use MiraLax (over the counter) for constipation as needed.     Diet - low sodium heart healthy    Complete by:  As directed      Diet general    Complete by:  As directed      Driving restrictions    Complete by:  As directed   No driving while taking narcotic pain meds.     Increase activity slowly as tolerated    Complete by:  As directed            Follow-up Information    Follow up with Marianna Payment, MD In 2 weeks.   Specialty:  Orthopedic Surgery   Why:  For  suture removal, For wound re-check   Contact information:   [address] [place] [address] [phone]        Signed: Marianna Payment 11/13/2015, 7:48 AM

## 2015-11-13 NOTE — Progress Notes (Signed)
   Subjective:  Patient reports pain as improved  Objective:   VITALS:   Filed Vitals:   11/12/15 1300 11/12/15 1559 11/12/15 2033 11/13/15 0443  BP: 132/51 134/54 136/56 132/59  Pulse: 69 69 69 69  Temp: 98.1 F (36.7 C)  98.5 F (36.9 C) 98.6 F (37 C)  TempSrc: Oral  Oral Oral  Resp: 18  18 18   Height:      Weight:    77.2 kg (170 lb 3.1 oz)  SpO2: 98%  99% 94%    Neurologically intact Neurovascular intact Sensation intact distally Intact pulses distally Dorsiflexion/Plantar flexion intact Incision: dressing C/D/I and no drainage No cellulitis present Compartment soft   Lab Results  Component Value Date   WBC 8.9 11/11/2015   HGB 9.2* 11/11/2015   HCT 29.2* 11/11/2015   MCV 91.0 11/11/2015   PLT 195 11/11/2015     Assessment/Plan:  6 Days Post-Op   - stable for dc to SNF today - f/u in office in 1 week  Marianna Payment 11/13/2015, 7:47 AM [phone]

## 2015-11-13 NOTE — Progress Notes (Signed)
Physical Therapy Treatment Patient Details Name: Brandy Houston MRN: [id number] DOB: 05-28-1956 Today's Date: 11/13/2015    History of Present Illness Pt is a 60 yo female admitted for a L TKA.  Pt with acute blood loss anemia post op with Hgb of 6.7.  Pt received 1 of  2 units of blood before evaluation.Pt with significant PMHx of HTN, peripheral neuropathy, hepatitis C, DM, depression, anxiety, septic arthritis, anemia, chronic kidney disease, bil shoulder arthroscopy, and bil knee arthroscopy.    PT Comments    Pt progressing towards physical therapy goals. Was able to improve ambulation distance this session with chair follow utilized for safety. Pt showing greater control of the LLE and steadiness during swing through of the RLE. Pt continues to be appropriate for d/c to SNF and PT will follow daily until d/c.   Follow Up Recommendations  SNF;Supervision/Assistance - 24 hour     Equipment Recommendations  None recommended by PT    Recommendations for Other Services       Precautions / Restrictions Precautions Precautions: Fall Required Braces or Orthoses: Knee Immobilizer - Left Knee Immobilizer - Left: On when out of bed or walking (Used with gait) Restrictions Weight Bearing Restrictions: Yes LLE Weight Bearing: Weight bearing as tolerated    Mobility  Bed Mobility Overal bed mobility: Needs Assistance Bed Mobility: Supine to Sit     Supine to sit: Supervision     General bed mobility comments: With knee immobilizer donned, pt was able to transition to EOB without assistance. Increased time to scoot all the way out to EOB.   Transfers Overall transfer level: Needs assistance Equipment used: Rolling walker (2 wheeled) Transfers: Sit to/from Stand Sit to Stand: Min assist         General transfer comment: VC's for hand placement on seated surface for safety. Pt was able to power-up to full standing with min assist from bed in lowest height.    Ambulation/Gait Ambulation/Gait assistance: Min guard Ambulation Distance (Feet): 50 Feet Assistive device: Rolling walker (2 wheeled) Gait Pattern/deviations: Step-to pattern;Decreased stride length;Trunk flexed Gait velocity: Decreased Gait velocity interpretation: Below normal speed for age/gender General Gait Details: VC's for improved posture, sequencing and safety with the RW. Pt continues to have difficulty with heel strike and is limiting weight bearing through the L side.    Stairs            Wheelchair Mobility    Modified Rankin (Stroke Patients Only)       Balance Overall balance assessment: Needs assistance Sitting-balance support: Feet supported;No upper extremity supported Sitting balance-Leahy Scale: Good     Standing balance support: Bilateral upper extremity supported;During functional activity Standing balance-Leahy Scale: Poor Standing balance comment: Reliant on RW for support.                     Cognition Arousal/Alertness: Awake/alert Behavior During Therapy: WFL for tasks assessed/performed Overall Cognitive Status: Within Functional Limits for tasks assessed                      Exercises Total Joint Exercises Ankle Circles/Pumps: 20 reps Quad Sets: 10 reps;Both Heel Slides: 10 reps;Seated Long Arc Quad: 10 reps Goniometric ROM: 72 AAROM L knee    General Comments        Pertinent Vitals/Pain Pain Assessment: 0-10 Pain Score: 7  Pain Location: L knee during ambulation Pain Descriptors / Indicators: Operative site guarding Pain Intervention(s): Limited activity within patient's tolerance;Monitored  during session;Repositioned    Home Living                      Prior Function            PT Goals (current goals can now be found in the care plan section) Acute Rehab PT Goals Patient Stated Goal: to not have so much pain. PT Goal Formulation: With patient Time For Goal Achievement:  11/22/15 Potential to Achieve Goals: Good Progress towards PT goals: Progressing toward goals    Frequency  7X/week    PT Plan Current plan remains appropriate    Co-evaluation             End of Session Equipment Utilized During Treatment: Gait belt;Left knee immobilizer Activity Tolerance: Patient limited by fatigue;Patient limited by pain Patient left: in chair;with call bell/phone within reach;with chair alarm set     Time: [id number] PT Time Calculation (min) (ACUTE ONLY): 31 min  Charges:  $Gait Training: 8-22 mins $Therapeutic Exercise: 8-22 mins                    G Codes:      Rolinda Roan December 03, 2015, 8:48 AM   Rolinda Roan, PT, DPT Acute Rehabilitation Services Pager: [phone]

## 2015-11-14 ENCOUNTER — Non-Acute Institutional Stay (SKILLED_NURSING_FACILITY): Payer: Medicare Other | Admitting: Internal Medicine

## 2015-11-14 ENCOUNTER — Encounter (HOSPITAL_COMMUNITY): Payer: Self-pay | Admitting: Orthopaedic Surgery

## 2015-11-14 DIAGNOSIS — F32A Depression, unspecified: Secondary | ICD-10-CM

## 2015-11-14 DIAGNOSIS — I10 Essential (primary) hypertension: Secondary | ICD-10-CM

## 2015-11-14 DIAGNOSIS — R2681 Unsteadiness on feet: Secondary | ICD-10-CM | POA: Diagnosis not present

## 2015-11-14 DIAGNOSIS — N184 Chronic kidney disease, stage 4 (severe): Secondary | ICD-10-CM

## 2015-11-14 DIAGNOSIS — M1712 Unilateral primary osteoarthritis, left knee: Secondary | ICD-10-CM

## 2015-11-14 DIAGNOSIS — E1142 Type 2 diabetes mellitus with diabetic polyneuropathy: Secondary | ICD-10-CM | POA: Diagnosis not present

## 2015-11-14 DIAGNOSIS — D62 Acute posthemorrhagic anemia: Secondary | ICD-10-CM | POA: Diagnosis not present

## 2015-11-14 DIAGNOSIS — F329 Major depressive disorder, single episode, unspecified: Secondary | ICD-10-CM

## 2015-11-14 DIAGNOSIS — M179 Osteoarthritis of knee, unspecified: Secondary | ICD-10-CM

## 2015-11-14 DIAGNOSIS — K219 Gastro-esophageal reflux disease without esophagitis: Secondary | ICD-10-CM | POA: Diagnosis not present

## 2015-11-14 DIAGNOSIS — K5901 Slow transit constipation: Secondary | ICD-10-CM

## 2015-11-14 DIAGNOSIS — E118 Type 2 diabetes mellitus with unspecified complications: Secondary | ICD-10-CM | POA: Diagnosis not present

## 2015-11-14 DIAGNOSIS — I5032 Chronic diastolic (congestive) heart failure: Secondary | ICD-10-CM | POA: Diagnosis not present

## 2015-11-14 DIAGNOSIS — E46 Unspecified protein-calorie malnutrition: Secondary | ICD-10-CM | POA: Diagnosis not present

## 2015-11-14 NOTE — Progress Notes (Signed)
LOCATION: [facility]  PCP: Lucrezia Starch, MD   Code Status: Full Code  Goals of care: Advanced Directive information Advanced Directives 11/08/2015  Does patient have an advance directive? No  Would patient like information on creating an advanced directive? No - patient declined information       Extended Emergency Contact Information Primary Emergency Contact: Jacinta Shoe [place] of [place] Phone: [phone] Relation: Son Secondary Emergency Contact: Oralia Manis [place] of [place] Mobile Phone: [phone] Relation: Daughter   Allergies  Allergen Reactions  . Compazine [Prochlorperazine] Shortness Of Breath and Swelling    TONGUE SWELLS  . Shellfish-Derived Products Anaphylaxis  . Iodinated Diagnostic Agents Hives and Rash  . Omnipaque [Iohexol] Hives    Chief Complaint  Patient presents with  . New Admit To SNF    New Admission     HPI:  Patient is a 60 y.o. female seen today for short term rehabilitation post hospital admission from 11/07/15-11/13/15 with left knee arthroplasty with revision of the implants on 11/07/15. She is seen in her room today. Her pain is under control with current pain regimen. She would like her tramadol discontinued.   Review of Systems:  Constitutional: Negative for fever, chills, diaphoresis. Energy level is slowly returning.  HENT: Negative for headache, congestion, nasal discharge Eyes: Negative for blurred vision, double vision and discharge.  Respiratory: Negative for cough, shortness of breath and wheezing.   Cardiovascular: Negative for chest pain, palpitations, leg swelling.  Gastrointestinal: Negative for heartburn, nausea, vomiting, abdominal pain, loss of appetite, melena. Last bowel movement was today. Some straining. No blood. Genitourinary: Negative for dysuria and flank pain.  Musculoskeletal: Negative for back pain, fall in the facility.  Skin: Negative for itching, rash.    Neurological: Positive for dizziness with change of position. Psychiatric/Behavioral: Negative for depression.    Past Medical History  Diagnosis Date  . Hypertension   . Peripheral neuropathy ([place])   . Hepatitis C 2016  . Diabetes mellitus     type 2, IDDM  . Depression   . Anxiety   . GERD (gastroesophageal reflux disease)   . Septic arthritis ([place])   . Dysrhythmia     tachycardia, normal ECHO 08-09-14  . Anemia   . Chronic kidney disease     stage IV. previous HD, none currently 10/30/15   Past Surgical History  Procedure Laterality Date  . Cholecystectomy    . Appendectomy    . Small intestine surgery      Due to Small Bowel Obstruction  . Tubal ligation    . Knee arthroscopy Right 08/10/2014    Procedure: ARTHROSCOPY I & D KNEE;  Surgeon: Marianna Payment, MD;  Location: [facility];  Service: Orthopedics;  Laterality: Right;  . Shoulder arthroscopy Bilateral 08/10/2014    Procedure: I & D BILATERAL SHOULDERS ;  Surgeon: Marianna Payment, MD;  Location: [facility];  Service: Orthopedics;  Laterality: Bilateral;  . Tenosynovectomy Right 08/11/2014    Procedure: RIGHT WRIST IRRIGATION AND DEBRIDEMENT, TENOSYNOVECTOMY;  Surgeon: Marianna Payment, MD;  Location: [facility];  Service: Orthopedics;  Laterality: Right;  . Knee arthroscopy Left 08/11/2014    Procedure: ARTHROSCOPIC WASHOUT LEFT KNEE;  Surgeon: Marianna Payment, MD;  Location: [facility];  Service: Orthopedics;  Laterality: Left;  . Tee without cardioversion N/A 08/14/2014    Procedure: TRANSESOPHAGEAL ECHOCARDIOGRAM (TEE);  Surgeon: Thayer Headings, MD;  Location: [facility];  Service: Cardiovascular;  Laterality: N/A;  . Knee arthroscopy  Left 08/19/2014    Procedure: ARTHROSCOPIC WASHOUT LEFT KNEE;  Surgeon: Leandrew Koyanagi, MD;  Location: [facility];  Service: Orthopedics;  Laterality: Left;  . Av fistula placement Left 09/13/2014    Procedure: Brachial Artery to Brachial Vein Gortex Four - Seven Stretch GRAFT INSERTION Left Forearm;   Surgeon: Mal Misty, MD;  Location: [facility];  Service: Vascular;  Laterality: Left;  . Knee arthroscopy with medial menisectomy Left 04/04/2015    Procedure: LEFT KNEE ARTHROSCOPY WITH PARTIAL MEDIAL MENISCECTOMY  AND SYNOVECTOMY;  Surgeon: Leandrew Koyanagi, MD;  Location: [facility];  Service: Orthopedics;  Laterality: Left;  . Knee arthroscopy with lateral menisectomy Left 04/04/2015    Procedure: AND PARTIAL LATERAL MENISECTOMY;  Surgeon: Leandrew Koyanagi, MD;  Location: [facility];  Service: Orthopedics;  Laterality: Left;  . Total knee arthroplasty Left 11/07/2015    Procedure: LEFT TOTAL KNEE ARTHROPLASTY WITH REVISION OF IMPLANTS;  Surgeon: Leandrew Koyanagi, MD;  Location: [facility];  Service: Orthopedics;  Laterality: Left;   Social History:   reports that she has been smoking Cigarettes.  She has a 10 pack-year smoking history. She has never used smokeless tobacco. She reports that she does not drink alcohol or use illicit drugs.  Family History  Problem Relation Age of Onset  . Cancer Mother   . Heart disease Father   . Cancer Sister   . Cancer Brother   . Diabetes Brother     Medications:   Medication List       This list is accurate as of: 11/14/15  3:06 PM.  Always use your most recent med list.               ACCU-CHEK AVIVA PLUS w/Device Kit  Check sugars TID for E11.65     accu-chek softclix lancets  Check sugars TID for E11.65     acetaminophen 500 MG tablet  Commonly known as:  TYLENOL  Take 1,000 mg by mouth every 6 (six) hours as needed for mild pain.     aspirin 81 MG chewable tablet  Chew 81 mg by mouth daily.     calcium carbonate 750 MG chewable tablet  Commonly known as:  TUMS EX  Chew 1 tablet by mouth daily.     diphenhydrAMINE 25 mg capsule  Commonly known as:  BENADRYL  Take 1 capsule (25 mg total) by mouth every 6 (six) hours as needed for itching.     enoxaparin 30 MG/0.3ML injection  Commonly known as:  LOVENOX  Inject  0.3 mLs (30 mg total) into the skin daily.     furosemide 80 MG tablet  Commonly known as:  LASIX  Take 160 mg by mouth 3 (three) times daily.     gabapentin 100 MG capsule  Commonly known as:  NEURONTIN  Take 1 capsule (100 mg total) by mouth 2 (two) times daily.     glucose blood test strip  Commonly known as:  ACCU-CHEK AVIVA  Check sugars TID for E11.65     hydrALAZINE 100 MG tablet  Commonly known as:  APRESOLINE  Take 1 tablet by mouth 2 (two) times daily.     insulin glargine 100 UNIT/ML injection  Commonly known as:  LANTUS  Inject 5 Units into the skin daily.     Insulin Syringe-Needle U-100 31G X 5/16" 0.5 ML Misc  Commonly known as:  BD INSULIN SYRINGE ULTRAFINE  Inject insulin once per night for E11.65  methocarbamol 750 MG tablet  Commonly known as:  ROBAXIN  Take 1 tablet (750 mg total) by mouth 2 (two) times daily as needed for muscle spasms.     metoprolol succinate 50 MG 24 hr tablet  Commonly known as:  TOPROL-XL  Take 1 tablet by mouth daily.     multivitamin with minerals Tabs tablet  Take 1 tablet by mouth daily.     omeprazole 20 MG capsule  Commonly known as:  PRILOSEC  Take 1 capsule by mouth every morning.     ondansetron 4 MG tablet  Commonly known as:  ZOFRAN  Take 1-2 tablets (4-8 mg total) by mouth every 8 (eight) hours as needed for nausea or vomiting.     oxyCODONE 5 MG immediate release tablet  Commonly known as:  Oxy IR/ROXICODONE  Take 1-3 tablets (5-15 mg total) by mouth every 4 (four) hours as needed.     oxyCODONE 10 mg 12 hr tablet  Commonly known as:  OXYCONTIN  Take 1 tablet (10 mg total) by mouth every 12 (twelve) hours.     Potassium Chloride ER 20 MEQ Tbcr  Take 1 tablet by mouth daily.     senna-docusate 8.6-50 MG tablet  Commonly known as:  SENOKOT S  Take 1 tablet by mouth at bedtime as needed.     sertraline 100 MG tablet  Commonly known as:  ZOLOFT  Take 1 tablet (100 mg total) by mouth daily.      traMADol 50 MG tablet  Commonly known as:  ULTRAM  Take 50 mg by mouth every 6 (six) hours as needed for moderate pain.        Immunizations: Immunization History  Administered Date(s) Administered  . Hepatitis B, adult 11/07/2014, 12/12/2014  . Influenza, Seasonal, Injecte, Preservative Fre 02/08/2015  . Influenza,inj,Quad PF,36+ Mos 08/08/2014  . PPD Test 11/13/2015  . Pneumococcal Polysaccharide-23 08/08/2014     Physical Exam: Filed Vitals:   11/14/15 1458  BP: 144/65  Pulse: 91  Temp: 98.9 F (37.2 C)  TempSrc: Oral  Resp: 16  Height: _0  (1.702 m)  Weight: 170 lb 3.2 oz (77.202 kg)  SpO2: 97%   Body mass index is 26.65 kg/(m^2).  General- adult female, well built, in no acute distress Head- normocephalic, atraumatic Nose- no maxillary or frontal sinus tenderness, no nasal discharge Throat- moist mucus membrane, poor dentition Eyes- PERRLA, EOMI, no pallor, no icterus, no discharge, normal conjunctiva, normal sclera Neck- no cervical lymphadenopathy Cardiovascular- normal s1,s2, no murmur, trace leg edema Respiratory- bilateral clear to auscultation, no wheeze, no rhonchi, no crackles, no use of accessory muscles Abdomen- bowel sounds present, soft, non tender Musculoskeletal- able to move all 4 extremities, limited left knee range of motion Neurological- alert and oriented to person, place and time Skin- warm and dry, left knee surgical incision with aquacel dressing in place Psychiatry- normal mood and affect    Labs reviewed: Basic Metabolic Panel:  Recent Labs  10/29/15 1426 11/07/15 0930 11/08/15 0150 11/08/15 0600 11/09/15 0540 11/11/15 0433  NA 139 140  --  139  --  136  K 4.3 3.7  --  4.1  --  4.9  CL 102  --   --  101  --  98*  CO2 27  --   --  30  --  29  GLUCOSE 140* 146*  --  150*  --  146*  BUN 48*  --   --  44*  --  55*  CREATININE 2.12*  --  2.38* 2.52*  --  2.60*  CALCIUM 9.2  --   --  7.6*  --  8.9  MG  --   --   --   --  2.0   --    Liver Function Tests:  Recent Labs  04/03/15 1439 06/26/15 2349 10/29/15 1426  AST _0 ALT 20 27 63*  ALKPHOS 150* 194* 191*  BILITOT 0.8 1.0 0.5  PROT 8.4* 7.2 7.6  ALBUMIN 3.2* 2.8*  2.7* 3.1*   No results for input(s): LIPASE, AMYLASE in the last 8760 hours. No results for input(s): AMMONIA in the last 8760 hours. CBC:  Recent Labs  10/29/15 1426  11/09/15 0540 11/10/15 1038 11/11/15 0433  WBC 5.7  < > 12.9* 10.1 8.9  NEUTROABS 3.8  --   --   --   --   HGB 9.5*  < > 8.5* 8.9* 9.2*  HCT 29.2*  < > 25.8* 28.6* 29.2*  MCV 91.5  < > 87.2 90.5 91.0  PLT 275  < > 144* 194 195  < > = values in this interval not displayed. Cardiac Enzymes: No results for input(s): CKTOTAL, CKMB, CKMBINDEX, TROPONINI in the last 8760 hours. BNP: Invalid input(s): POCBNP CBG:  Recent Labs  11/13/15 0630 11/13/15 1125 11/13/15 1603  GLUCAP 143* 115* 151*    Radiological Exams: Dg Knee Left Port  11/07/2015  CLINICAL DATA:  Total knee replacement. EXAM: PORTABLE LEFT KNEE - 1-2 VIEW COMPARISON:  10/12/2015 FINDINGS: Total knee arthroplasty without periprosthetic fracture or subluxation. Antibiotic beads noted mainly in the suprapatellar joint space neighboring a drain. IMPRESSION: Total knee arthroplasty without acute finding. Electronically Signed   By: Monte Fantasia M.D.   On: 11/07/2015 14:42    Assessment/Plan  Unsteady gait Post op, Will have patient work with PT/OT as tolerated to regain strength and restore function.  Fall precautions are in place.  Left knee OA S/p arthroplasty with revision of implants. Has orthopedic follow up. Continue oxycontin 10 mg bid with oxyIR 5 mg 1-3 tab q4h prn pain. D/c tramadol order.continue robaxin 750 mg bid prn muscle spasm. Continue lovenox for dvt prophylaxis.   Blood loss anemia Post op, monitor cbc  Constipation On senokot s qhs prn, change this to senokot s 2 tab daily for now and monitor. Encouraged  hydration  Neuropathic pain Continue neurontin 100 mg bid  Chronic diastolic chf euvolemic on exam. Continue lasix 160 mg tid, hydralazine 100 mg bid, toprol xl 50 mg daily. Continue kcl. Check bmp and weight  DM with ckd Lab Results  Component Value Date   HGBA1C 6.4* 11/08/2015   Monitor cbg. Continue lantus 5 u daily  HTN Monitor bp, continue hydralazine and b blocker, monitor bp, check bmp  gerd Stable symptom, continue omeprazole  Depression Monitor mood, continue zoloft  Protein calorie malnutrition Get dietary consult. Monitor weight  ckd stage 4 Monitor bmp    Goals of care: short term rehabilitation   Labs/tests ordered: cbc, cmp  Family/ staff Communication: reviewed care plan with patient and nursing supervisor    Blanchie Serve, MD Internal Medicine [address] [place], [place] [address] Cell Phone (Monday-Friday 8 am - 5 pm): [phone] On Call: [phone] and follow prompts after 5 pm and on weekends Office Phone: [phone] Office Fax: [phone]

## 2015-11-15 LAB — HEPATIC FUNCTION PANEL
ALT: 25 U/L (ref 7–35)
AST: 11 U/L — AB (ref 13–35)
Alkaline Phosphatase: 152 U/L — AB (ref 25–125)
Bilirubin, Total: 0.3 mg/dL

## 2015-11-15 LAB — CBC AND DIFFERENTIAL
HCT: 29 % — AB (ref 36–46)
Hemoglobin: 8.8 g/dL — AB (ref 12.0–16.0)
Platelets: 346 10*3/uL (ref 150–399)
WBC: 7.7 10^3/mL

## 2015-11-15 LAB — BASIC METABOLIC PANEL
BUN: 47 mg/dL — AB (ref 4–21)
Creatinine: 2.2 mg/dL — AB (ref 0.5–1.1)
Glucose: 135 mg/dL
Potassium: 4.5 mmol/L (ref 3.4–5.3)
Sodium: 141 mmol/L (ref 137–147)

## 2015-11-16 ENCOUNTER — Encounter: Payer: Self-pay | Admitting: Internal Medicine

## 2015-11-16 ENCOUNTER — Non-Acute Institutional Stay (SKILLED_NURSING_FACILITY): Payer: Medicare Other | Admitting: Internal Medicine

## 2015-11-16 DIAGNOSIS — D649 Anemia, unspecified: Secondary | ICD-10-CM | POA: Diagnosis not present

## 2015-11-16 DIAGNOSIS — N184 Chronic kidney disease, stage 4 (severe): Secondary | ICD-10-CM | POA: Diagnosis not present

## 2015-11-16 DIAGNOSIS — F17200 Nicotine dependence, unspecified, uncomplicated: Secondary | ICD-10-CM

## 2015-11-16 NOTE — Progress Notes (Signed)
LOCATION: [facility]  PCP: Lucrezia Starch, MD   Code Status: Full Code  Goals of care: Advanced Directive information Advanced Directives 11/08/2015  Does patient have an advance directive? No  Would patient like information on creating an advanced directive? No - patient declined information       Extended Emergency Contact Information Primary Emergency Contact: Jacinta Shoe [place] of [place] Phone: [phone] Relation: Son Secondary Emergency Contact: Oralia Manis [place] of [place] Mobile Phone: [phone] Relation: Daughter   Allergies  Allergen Reactions  . Compazine [Prochlorperazine] Shortness Of Breath and Swelling    TONGUE SWELLS  . Shellfish-Derived Products Anaphylaxis  . Iodinated Diagnostic Agents Hives and Rash  . Omnipaque [Iohexol] Hives    Chief Complaint  Patient presents with  . Acute Visit    Drop in hemoglobin     HPI:  Patient is a 60 y.o. [place] seen today for acute visit. She has drop in hemoglobin on lab work. She is here for short term rehabilitation post hospital admission from 11/07/15-11/13/15 with left knee arthroplasty with revision of the implants on 11/07/15. She was taking epogen in the past for her anemia. She has been smoking half a pack of cigarettes at home and would like to be on nicotine patch.    Review of Systems:  Constitutional: Negative for fever. Energy level is slowly returning.  HENT: Negative for headache Eyes: Negative for blurred vision Respiratory: Negative for cough, shortness of breath and wheezing.   Cardiovascular: Negative for chest pain, palpitations, leg swelling.  Gastrointestinal: Negative for heartburn, nausea, vomiting, abdominal pain Genitourinary: Negative for dysuria.  Musculoskeletal: Negative for fall in the facility.  Skin: Negative for itching, rash.     Past Medical History  Diagnosis Date  . Hypertension   . Peripheral neuropathy ([place])   . Hepatitis C  2016  . Diabetes mellitus     type 2, IDDM  . Depression   . Anxiety   . GERD (gastroesophageal reflux disease)   . Septic arthritis ([place])   . Dysrhythmia     tachycardia, normal ECHO 08-09-14  . Anemia   . Chronic kidney disease     stage IV. previous HD, none currently 10/30/15   Past Surgical History  Procedure Laterality Date  . Cholecystectomy    . Appendectomy    . Small intestine surgery      Due to Small Bowel Obstruction  . Tubal ligation    . Knee arthroscopy Right 08/10/2014    Procedure: ARTHROSCOPY I & D KNEE;  Surgeon: Marianna Payment, MD;  [facility];  Service: Orthopedics;  Laterality: Right;  . Shoulder arthroscopy Bilateral 08/10/2014    Procedure: I & D BILATERAL SHOULDERS ;  Surgeon: Marianna Payment, MD;  [facility];  Service: Orthopedics;  Laterality: Bilateral;  . Tenosynovectomy Right 08/11/2014    Procedure: RIGHT WRIST IRRIGATION AND DEBRIDEMENT, TENOSYNOVECTOMY;  Surgeon: Marianna Payment, MD;  Location: [facility];  Service: Orthopedics;  Laterality: Right;  . Knee arthroscopy Left 08/11/2014    Procedure: ARTHROSCOPIC WASHOUT LEFT KNEE;  Surgeon: Marianna Payment, MD;  Location: [facility];  Service: Orthopedics;  Laterality: Left;  . Tee without cardioversion N/A 08/14/2014    Procedure: TRANSESOPHAGEAL ECHOCARDIOGRAM (TEE);  Surgeon: Thayer Headings, MD;  Location: [facility];  Service: Cardiovascular;  Laterality: N/A;  . Knee arthroscopy Left 08/19/2014    Procedure: ARTHROSCOPIC WASHOUT LEFT KNEE;  Surgeon: Leandrew Koyanagi, MD;  Location: [facility];  Service: Orthopedics;  Laterality: Left;  . Av fistula placement Left 09/13/2014    Procedure: Brachial Artery to Brachial Vein Gortex Four - Seven Stretch GRAFT INSERTION Left Forearm;  Surgeon: Mal Misty, MD;  Location: [facility];  Service: Vascular;  Laterality: Left;  . Knee arthroscopy with medial menisectomy Left 04/04/2015    Procedure: LEFT KNEE ARTHROSCOPY WITH PARTIAL MEDIAL MENISCECTOMY  AND  SYNOVECTOMY;  Surgeon: Leandrew Koyanagi, MD;  Location: [facility];  Service: Orthopedics;  Laterality: Left;  . Knee arthroscopy with lateral menisectomy Left 04/04/2015    Procedure: AND PARTIAL LATERAL MENISECTOMY;  Surgeon: Leandrew Koyanagi, MD;  Location: [facility];  Service: Orthopedics;  Laterality: Left;  . Total knee arthroplasty Left 11/07/2015    Procedure: LEFT TOTAL KNEE ARTHROPLASTY WITH REVISION OF IMPLANTS;  Surgeon: Leandrew Koyanagi, MD;  Location: [facility];  Service: Orthopedics;  Laterality: Left;    Medications:   Medication List       This list is accurate as of: 11/16/15  2:23 PM.  Always use your most recent med list.               ACCU-CHEK AVIVA PLUS w/Device Kit  Check sugars TID for E11.65     accu-chek softclix lancets  Check sugars TID for E11.65     acetaminophen 500 MG tablet  Commonly known as:  TYLENOL  Take 1,000 mg by mouth every 6 (six) hours as needed for mild pain.     aspirin 81 MG chewable tablet  Chew 81 mg by mouth daily.     calcium carbonate 750 MG chewable tablet  Commonly known as:  TUMS EX  Chew 1 tablet by mouth daily.     diphenhydrAMINE 25 mg capsule  Commonly known as:  BENADRYL  Take 1 capsule (25 mg total) by mouth every 6 (six) hours as needed for itching.     enoxaparin 30 MG/0.3ML injection  Commonly known as:  LOVENOX  Inject 0.3 mLs (30 mg total) into the skin daily.     furosemide 80 MG tablet  Commonly known as:  LASIX  Take 160 mg by mouth 3 (three) times daily.     gabapentin 100 MG capsule  Commonly known as:  NEURONTIN  Take 1 capsule (100 mg total) by mouth 2 (two) times daily.     glucose blood test strip  Commonly known as:  ACCU-CHEK AVIVA  Check sugars TID for E11.65     hydrALAZINE 100 MG tablet  Commonly known as:  APRESOLINE  Take 1 tablet by mouth 2 (two) times daily.     insulin glargine 100 UNIT/ML injection  Commonly known as:  LANTUS  Inject 5 Units into the skin  daily.     Insulin Syringe-Needle U-100 31G X 5/16" 0.5 ML Misc  Commonly known as:  BD INSULIN SYRINGE ULTRAFINE  Inject insulin once per night for E11.65     methocarbamol 750 MG tablet  Commonly known as:  ROBAXIN  Take 1 tablet (750 mg total) by mouth 2 (two) times daily as needed for muscle spasms.     metoprolol succinate 50 MG 24 hr tablet  Commonly known as:  TOPROL-XL  Take 1 tablet by mouth daily.     multivitamin with minerals Tabs tablet  Take 1 tablet by mouth daily.     omeprazole 20 MG capsule  Commonly known as:  PRILOSEC  Take 1 capsule by mouth every morning.  ondansetron 4 MG tablet  Commonly known as:  ZOFRAN  Take 1-2 tablets (4-8 mg total) by mouth every 8 (eight) hours as needed for nausea or vomiting.     oxyCODONE 5 MG immediate release tablet  Commonly known as:  Oxy IR/ROXICODONE  Take 1-3 tablets (5-15 mg total) by mouth every 4 (four) hours as needed.     oxyCODONE 10 mg 12 hr tablet  Commonly known as:  OXYCONTIN  Take 1 tablet (10 mg total) by mouth every 12 (twelve) hours.     Potassium Chloride ER 20 MEQ Tbcr  Take 1 tablet by mouth daily.     senna 8.6 MG tablet  Commonly known as:  SENOKOT  Take 2 tablets by mouth at bedtime.     sertraline 100 MG tablet  Commonly known as:  ZOLOFT  Take 1 tablet (100 mg total) by mouth daily.        Immunizations: Immunization History  Administered Date(s) Administered  . Hepatitis B, adult 11/07/2014, 12/12/2014  . Influenza, Seasonal, Injecte, Preservative Fre 02/08/2015  . Influenza,inj,Quad PF,36+ Mos 08/08/2014  . PPD Test 11/13/2015  . Pneumococcal Polysaccharide-23 08/08/2014     Physical Exam: Filed Vitals:   11/16/15 1414  BP: 136/69  Pulse: 78  Height: '5\' 7"'  (1.702 m)  Weight: 170 lb 3.2 oz (77.202 kg)   Body mass index is 26.65 kg/(m^2).  General- adult [place], well built, in no acute distress Head- normocephalic, atraumatic Throat- moist mucus membrane, poor  dentition Eyes- no pallor, no icterus Neck- no cervical lymphadenopathy Cardiovascular- normal s1,s2, no murmur, trace leg edema Respiratory- bilateral clear to auscultation Abdomen- bowel sounds present, soft, non tender Musculoskeletal- able to move all 4 extremities, limited left knee range of motion Neurological- alert and oriented to person, place and time Skin- warm and dry, left knee surgical incision with aquacel dressing in place Psychiatry- normal mood and affect    Labs reviewed: Basic Metabolic Panel:  Recent Labs  10/29/15 1426 11/07/15 0930  11/08/15 0600 11/09/15 0540 11/11/15 0433 11/15/15  NA 139 140  --  139  --  136 141  K 4.3 3.7  --  4.1  --  4.9 4.5  CL 102  --   --  101  --  98*  --   CO2 27  --   --  30  --  29  --   GLUCOSE 140* 146*  --  150*  --  146*  --   BUN 48*  --   --  44*  --  [place]* 47*  CREATININE 2.12*  --   < > 2.52*  --  2.60* 2.2*  CALCIUM 9.2  --   --  7.6*  --  8.9  --   MG  --   --   --   --  2.0  --   --   < > = values in this interval not displayed. Liver Function Tests:  Recent Labs  04/03/15 1439 06/26/15 2349 10/29/15 1426 11/15/15  AST '16 21 30 ' 11*  ALT 20 27 63* 25  ALKPHOS 150* 194* 191* 152*  BILITOT 0.8 1.0 0.5  --   PROT 8.4* 7.2 7.6  --   ALBUMIN 3.2* 2.8*  2.7* 3.1*  --    No results for input(s): LIPASE, AMYLASE in the last 8760 hours. No results for input(s): AMMONIA in the last 8760 hours. CBC:  Recent Labs  10/29/15 1426  11/09/15 0540 11/10/15 1038 11/11/15 0433 11/15/15  WBC 5.7  < > 12.9* 10.1 8.9 7.7  NEUTROABS 3.8  --   --   --   --   --   HGB 9.5*  < > 8.5* 8.9* 9.2* 8.8*  HCT 29.2*  < > 25.8* 28.6* 29.2* 29*  MCV 91.5  < > 87.2 90.5 91.0  --   PLT 275  < > 144* 194 195 346  < > = values in this interval not displayed.    Radiological Exams: Dg Knee Left Port  11/07/2015  CLINICAL DATA:  Total knee replacement. EXAM: PORTABLE LEFT KNEE - 1-2 VIEW COMPARISON:  10/12/2015 FINDINGS: Total  knee arthroplasty without periprosthetic fracture or subluxation. Antibiotic beads noted mainly in the suprapatellar joint space neighboring a drain. IMPRESSION: Total knee arthroplasty without acute finding. Electronically Signed   By: Monte Fantasia M.D.   On: 11/07/2015 14:42    Assessment/Plan  Anemia Appears to be a mixture of Blood loss anemia post op and anemia of chronic disease. Start ferrous sulphate 325 mg bid and monitor cbc. Will need to make renal appointment for her epogen injection.  Tobacco use Start nicotine patch 14 mcg daily for now. Monitor clinically.   ckd stage 4 Monitor renal function    Blanchie Serve, MD Internal Medicine [facility] [address] [place], [place] [address] Cell Phone (Monday-Friday 8 am - 5 pm): [phone] On Call: [phone] and follow prompts after 5 pm and on weekends Office Phone: [phone] Office Fax: [phone]

## 2015-11-22 DIAGNOSIS — M1712 Unilateral primary osteoarthritis, left knee: Secondary | ICD-10-CM | POA: Diagnosis not present

## 2015-11-22 LAB — CBC AND DIFFERENTIAL
HCT: 29 % — AB (ref 36–46)
Hemoglobin: 8.8 g/dL — AB (ref 12.0–16.0)
Neutrophils Absolute: 7 /uL
Platelets: 403 10*3/uL — AB (ref 150–399)
WBC: 9.9 10^3/mL

## 2015-11-27 DIAGNOSIS — E1129 Type 2 diabetes mellitus with other diabetic kidney complication: Secondary | ICD-10-CM | POA: Diagnosis not present

## 2015-11-27 DIAGNOSIS — N2581 Secondary hyperparathyroidism of renal origin: Secondary | ICD-10-CM | POA: Diagnosis not present

## 2015-11-27 DIAGNOSIS — N184 Chronic kidney disease, stage 4 (severe): Secondary | ICD-10-CM | POA: Diagnosis not present

## 2015-11-27 DIAGNOSIS — I129 Hypertensive chronic kidney disease with stage 1 through stage 4 chronic kidney disease, or unspecified chronic kidney disease: Secondary | ICD-10-CM | POA: Diagnosis not present

## 2015-11-27 DIAGNOSIS — D631 Anemia in chronic kidney disease: Secondary | ICD-10-CM | POA: Diagnosis not present

## 2015-11-29 ENCOUNTER — Encounter: Payer: Self-pay | Admitting: Adult Health

## 2015-11-29 ENCOUNTER — Non-Acute Institutional Stay (SKILLED_NURSING_FACILITY): Payer: Medicare Other | Admitting: Adult Health

## 2015-11-29 DIAGNOSIS — E876 Hypokalemia: Secondary | ICD-10-CM

## 2015-11-29 DIAGNOSIS — D62 Acute posthemorrhagic anemia: Secondary | ICD-10-CM

## 2015-11-29 DIAGNOSIS — G629 Polyneuropathy, unspecified: Secondary | ICD-10-CM

## 2015-11-29 DIAGNOSIS — F329 Major depressive disorder, single episode, unspecified: Secondary | ICD-10-CM

## 2015-11-29 DIAGNOSIS — M179 Osteoarthritis of knee, unspecified: Secondary | ICD-10-CM

## 2015-11-29 DIAGNOSIS — K5901 Slow transit constipation: Secondary | ICD-10-CM

## 2015-11-29 DIAGNOSIS — N184 Chronic kidney disease, stage 4 (severe): Secondary | ICD-10-CM

## 2015-11-29 DIAGNOSIS — I5032 Chronic diastolic (congestive) heart failure: Secondary | ICD-10-CM | POA: Diagnosis not present

## 2015-11-29 DIAGNOSIS — F17200 Nicotine dependence, unspecified, uncomplicated: Secondary | ICD-10-CM | POA: Diagnosis not present

## 2015-11-29 DIAGNOSIS — I10 Essential (primary) hypertension: Secondary | ICD-10-CM | POA: Diagnosis not present

## 2015-11-29 DIAGNOSIS — R2681 Unsteadiness on feet: Secondary | ICD-10-CM

## 2015-11-29 DIAGNOSIS — F32A Depression, unspecified: Secondary | ICD-10-CM

## 2015-11-29 DIAGNOSIS — M1712 Unilateral primary osteoarthritis, left knee: Secondary | ICD-10-CM

## 2015-11-29 DIAGNOSIS — K219 Gastro-esophageal reflux disease without esophagitis: Secondary | ICD-10-CM

## 2015-11-29 DIAGNOSIS — E118 Type 2 diabetes mellitus with unspecified complications: Secondary | ICD-10-CM

## 2015-11-29 NOTE — Progress Notes (Signed)
[name], female   DOB: 07/29/1955, 60 y.o.   MRN: [id number]    DATE:  11/29/2015   MRN:  [id number]  BIRTHDAY: 1956-01-26  Facility:  [facility]ursing Home Location:  [place] Room Number: 1206-P  LEVEL OF CARE:  SNF (31)  Contact Information    Name Relation Home Work [place] Son [phone]     Dubreuil,[name] Daughter   [phone]       Code Status History    Date Active Date Inactive Code Status Order ID Comments User Context   09/14/2014  8:44 PM 09/30/2014  5:20 PM Full Code [id number]  Bary Leriche, PA-C Inpatient   08/29/2014 10:13 AM 09/14/2014  8:44 PM Full Code [id number]  Sandi Mariscal, MD Inpatient   08/18/2014  3:22 PM 08/29/2014 10:13 AM Full Code [id number]  Azzie Roup, MD Inpatient   08/18/2014  2:16 PM 08/18/2014  3:22 PM Full Code [id number]  Azzie Roup, MD Inpatient   08/11/2014 12:27 AM 08/18/2014  2:16 PM Full Code [id number]  Corrie Mckusick, DO Inpatient   08/07/2014  8:09 PM 08/11/2014 12:27 AM Full Code [id number]  Eber Goral, MD Inpatient   10/18/2011 10:43 PM 10/19/2011 10:59 PM Full Code [id number]  Ferd Glassing, RN Inpatient       Chief Complaint  Patient presents with  . Discharge Note    HISTORY OF PRESENT ILLNESS:  This is a 60 year old female who is for discharge home and will have outpatient PT rehabilitation.  She has been admitted to [facility] on 11/13/15 from [facility] with left knee arthroplasty revision of the implants on 11/07/15.  Patient was admitted to this facility for short-term rehabilitation after the patient's recent hospitalization.  Patient has completed SNF rehabilitation and therapy has cleared the patient for discharge.  PAST MEDICAL HISTORY:  Past Medical History  Diagnosis Date  . Hypertension   . Peripheral neuropathy ([place])   . Hepatitis C 2016  . Diabetes mellitus     type 2, IDDM  . Depression     Chronic  . Anxiety   . GERD (gastroesophageal  reflux disease)   . Septic arthritis ([place])   . Dysrhythmia     tachycardia, normal ECHO 08-09-14  . Anemia   . Chronic kidney disease     stage IV. previous HD, none currently 10/30/15  . Unsteady gait   . Osteoarthritis of left knee   . Protein calorie malnutrition ([place])   . Chronic diastolic CHF (congestive heart failure) ([place])   . Diabetic peripheral neuropathy ([place])   . Slow transit constipation      CURRENT MEDICATIONS: Reviewed  Patient's Medications  New Prescriptions   No medications on file  Previous Medications   ACETAMINOPHEN (TYLENOL) 500 MG TABLET    Take 1,000 mg by mouth every 6 (six) hours as needed for mild pain.   ASPIRIN 81 MG CHEWABLE TABLET    Chew 81 mg by mouth daily.    BLOOD GLUCOSE MONITORING SUPPL (ACCU-CHEK AVIVA PLUS) W/DEVICE KIT    Check sugars TID for E11.65   CALCIUM CARBONATE (TUMS EX) 750 MG CHEWABLE TABLET    Chew 1 tablet by mouth daily.   DIPHENHYDRAMINE (BENADRYL) 25 MG CAPSULE    Take 1 capsule (25 mg total) by mouth every 6 (six) hours as needed for itching.   FERROUS SULFATE 325 (65 FE) MG TABLET    Take 325 mg by  mouth 2 (two) times daily with a meal.   FUROSEMIDE (LASIX) 80 MG TABLET    Take 160 mg by mouth 3 (three) times daily.    GABAPENTIN (NEURONTIN) 100 MG CAPSULE    Take 1 capsule (100 mg total) by mouth 2 (two) times daily.   GLUCOSE BLOOD (ACCU-CHEK AVIVA) TEST STRIP    Check sugars TID for E11.65   HYDRALAZINE (APRESOLINE) 100 MG TABLET    Take 1 tablet by mouth 2 (two) times daily.   INSULIN GLARGINE (LANTUS) 100 UNIT/ML INJECTION    Inject 5 Units into the skin daily.   INSULIN SYRINGE-NEEDLE U-100 (BD INSULIN SYRINGE ULTRAFINE) 31G X 5/16" 0.5 ML MISC    Inject insulin once per night for E11.65   LANCET DEVICES (ACCU-CHEK SOFTCLIX) LANCETS    Check sugars TID for E11.65   METHOCARBAMOL (ROBAXIN) 750 MG TABLET    Take 1 tablet (750 mg total) by mouth 2 (two) times daily as needed for muscle spasms.   METOPROLOL SUCCINATE  (TOPROL-XL) 50 MG 24 HR TABLET    Take 1 tablet by mouth daily.   MULTIPLE VITAMIN (MULTIVITAMIN WITH MINERALS) TABS TABLET    Take 1 tablet by mouth daily.   NICOTINE (NICODERM CQ - DOSED IN MG/24 HOURS) 14 MG/24HR PATCH    Place 14 mg onto the skin daily.   OMEPRAZOLE (PRILOSEC) 20 MG CAPSULE    Take 1 capsule by mouth every morning.    ONDANSETRON (ZOFRAN) 4 MG TABLET    Take 1-2 tablets (4-8 mg total) by mouth every 8 (eight) hours as needed for nausea or vomiting.   OXYCODONE (OXY IR/ROXICODONE) 5 MG IMMEDIATE RELEASE TABLET    Take 1-3 tablets (5-15 mg total) by mouth every 4 (four) hours as needed.   OXYCODONE (OXYCONTIN) 10 MG 12 HR TABLET    Take 1 tablet (10 mg total) by mouth every 12 (twelve) hours.   POTASSIUM CHLORIDE ER 20 MEQ TBCR    Take 1 tablet by mouth daily.   SENNOSIDES-DOCUSATE SODIUM (SENOKOT-S) 8.6-50 MG TABLET    Take 2 tablets by mouth at bedtime.   SERTRALINE (ZOLOFT) 100 MG TABLET    Take 1 tablet (100 mg total) by mouth daily.  Modified Medications   No medications on file  Discontinued Medications   ENOXAPARIN (LOVENOX) 30 MG/0.3ML INJECTION    Inject 0.3 mLs (30 mg total) into the skin daily.   SENNA (SENOKOT) 8.6 MG TABLET    Take 2 tablets by mouth at bedtime.     Allergies  Allergen Reactions  . Compazine [Prochlorperazine] Shortness Of Breath and Swelling    TONGUE SWELLS  . Shellfish-Derived Products Anaphylaxis  . Iodinated Diagnostic Agents Hives and Rash  . Omnipaque [Iohexol] Hives     REVIEW OF SYSTEMS:  GENERAL: no change in appetite, no fatigue, no weight changes, no fever, chills or weakness EYES: Denies change in vision, dry eyes, eye pain, itching or discharge EARS: Denies change in hearing, ringing in ears, or earache NOSE: Denies nasal congestion or epistaxis MOUTH and THROAT: Denies oral discomfort, gingival pain or bleeding, pain from teeth or hoarseness   RESPIRATORY: no cough, SOB, DOE, wheezing, hemoptysis CARDIAC: no chest  pain, edema or palpitations GI: no abdominal pain, diarrhea, constipation, heart burn, nausea or vomiting GU: Denies dysuria, frequency, hematuria, incontinence, or discharge PSYCHIATRIC: Denies feeling of depression or anxiety. No report of hallucinations, insomnia, paranoia, or agitation   PHYSICAL EXAMINATION  GENERAL APPEARANCE: Well nourished. In no acute  distress. Normal body habitus SKIN:  Left knee surgical incision is healed HEAD: Normal in size and contour. No evidence of trauma EYES: Lids open and close normally. No blepharitis, entropion or ectropion. PERRL. Conjunctivae are clear and sclerae are white. Lenses are without opacity EARS: Pinnae are normal. Patient hears normal voice tunes of the examiner MOUTH and THROAT: Lips are without lesions. Oral mucosa is moist and without lesions. Tongue is normal in shape, size, and color and without lesions NECK: supple, trachea midline, no neck masses, no thyroid tenderness, no thyromegaly LYMPHATICS: no LAN in the neck, no supraclavicular LAN RESPIRATORY: breathing is even & unlabored, BS CTAB CARDIAC: RRR, no murmur,no extra heart sounds, no edema GI: abdomen soft, normal BS, no masses, no tenderness, no hepatomegaly, no splenomegaly EXTREMITIES:  Able to move 4 extremities; has left forearm AV fistula + bruit/thrill PSYCHIATRIC: Alert and oriented X 3. Affect and behavior are appropriate  LABS/RADIOLOGY: Labs reviewed: Basic Metabolic Panel:  Recent Labs  10/29/15 1426 11/07/15 0930  11/08/15 0600 11/09/15 0540 11/11/15 0433 11/15/15  NA 139 140  --  139  --  136 141  K 4.3 3.7  --  4.1  --  4.9 4.5  CL 102  --   --  101  --  98*  --   CO2 27  --   --  30  --  29  --   GLUCOSE 140* 146*  --  150*  --  146*  --   BUN 48*  --   --  44*  --  55* 47*  CREATININE 2.12*  --   < > 2.52*  --  2.60* 2.2*  CALCIUM 9.2  --   --  7.6*  --  8.9  --   MG  --   --   --   --  2.0  --   --   < > = values in this interval not  displayed. Liver Function Tests:  Recent Labs  04/03/15 1439 06/26/15 2349 10/29/15 1426 11/15/15  AST '16 21 30 ' 11*  ALT 20 27 63* 25  ALKPHOS 150* 194* 191* 152*  BILITOT 0.8 1.0 0.5  --   PROT 8.4* 7.2 7.6  --   ALBUMIN 3.2* 2.8*  2.7* 3.1*  --    CBC:  Recent Labs  10/29/15 1426  11/09/15 0540 11/10/15 1038 11/11/15 0433 11/15/15 11/22/15  WBC 5.7  < > 12.9* 10.1 8.9 7.7 9.9  NEUTROABS 3.8  --   --   --   --   --  7  HGB 9.5*  < > 8.5* 8.9* 9.2* 8.8* 8.8*  HCT 29.2*  < > 25.8* 28.6* 29.2* 29* 29*  MCV 91.5  < > 87.2 90.5 91.0  --   --   PLT 275  < > 144* 194 195 346 403*  < > = values in this interval not displayed.  Lipid Panel:  Recent Labs  11/08/15 1837  HDL 29*   CBG:  Recent Labs  11/13/15 0630 11/13/15 1125 11/13/15 1603  GLUCAP 143* 115* 151*    Dg Knee Left Port  11/07/2015  CLINICAL DATA:  Total knee replacement. EXAM: PORTABLE LEFT KNEE - 1-2 VIEW COMPARISON:  10/12/2015 FINDINGS: Total knee arthroplasty without periprosthetic fracture or subluxation. Antibiotic beads noted mainly in the suprapatellar joint space neighboring a drain. IMPRESSION: Total knee arthroplasty without acute finding. Electronically Signed   By: Monte Fantasia M.D.   On: 11/07/2015 14:42  ASSESSMENT/PLAN:   Unsteady gait - for outpatient PT  Left knee osteoarthritis S/P arthroplasty with revision of the implants - for orthopedic surgeon follow-up; continue Robaxin 750 mg 1 tab by mouth twice a day when necessary for muscle spasm; oxycodone 10 mg 1 tab by mouth every 12 hours and oxycodone 5 mg IR 1-3 tabs by mouth every 4 hours when necessary for pain; aspirin 81 mg EC 1 tab by mouth daily for DVT prophylaxis  Anemia, acute blood loss - continue ferrous sulfate 325 mg 1 tab by mouth twice a day Lab Results  Component Value Date   WBC 9.9 11/22/2015   HGB 8.8* 11/22/2015   HCT 29* 11/22/2015   MCV 91.0 11/11/2015   PLT 403* 11/22/2015   Tobacco use -  discontinue nicotine 14 mg/24 hour 1 patch daily; start Nicotine 7 mg/24 hr 1 patch onto skin daily X 2 weeks then discontinue  Constipation - continue senna S2 tabs by mouth daily at bedtime  Neuropathy - continue Neurontin 100 mg 1 capsule by mouth twice a day  Hypertension - continue hydralazine 100 mg 1 tab by mouth twice a day and metoprolol succinate ER 50 mg 1 tab by mouth daily  Chronic diastolic CHF - no SOB; continue Lasix 80 mg take 2 tabs = 160 mg by mouth 3 times a day, hydralazine 100 mg by mouth twice a day and Toprol-XL 50 mg 1 tab by mouth daily  Hypokalemia - continue KCL  20 meq 1 tab by mouth daily Lab Results  Component Value Date   K 4.5 11/15/2015   Depression - continue Zoloft 100 mg 1 tab by mouth daily  Diabetes mellitus, type 2 with CKD - continue Lantus 100 units/mL inject 5 units subcutaneous daily at 10 PM Lab Results  Component Value Date   HGBA1C 6.4* 11/08/2015   Chronic kidney disease stage IV -  will monitor; stable Lab Results  Component Value Date   CREATININE 2.2* 11/15/2015   GERD - stable; continue omeprazole 20 mg 1 capsule by mouth daily    I have filled out patient's discharge paperwork and written prescriptions.  Patient will have outpatient PT.  DME provided:  None  Total discharge time: Greater than 30 minutes  Discharge time involved coordination of the discharge process with social worker, nursing staff and therapy department.     Durenda Age, NP [employer] [phone]

## 2015-12-06 ENCOUNTER — Encounter (HOSPITAL_COMMUNITY): Payer: Medicare Other

## 2015-12-07 ENCOUNTER — Other Ambulatory Visit (HOSPITAL_COMMUNITY): Payer: Self-pay | Admitting: *Deleted

## 2015-12-10 ENCOUNTER — Ambulatory Visit (HOSPITAL_COMMUNITY)
Admission: RE | Admit: 2015-12-10 | Discharge: 2015-12-10 | Disposition: A | Discharge status: Home / Self Care | Payer: Medicare Other | Source: Ambulatory Visit | Attending: Nephrology | Admitting: Nephrology

## 2015-12-10 DIAGNOSIS — N184 Chronic kidney disease, stage 4 (severe): Secondary | ICD-10-CM | POA: Diagnosis not present

## 2015-12-10 DIAGNOSIS — D631 Anemia in chronic kidney disease: Secondary | ICD-10-CM | POA: Diagnosis not present

## 2015-12-10 DIAGNOSIS — D509 Iron deficiency anemia, unspecified: Secondary | ICD-10-CM | POA: Insufficient documentation

## 2015-12-10 DIAGNOSIS — Z79899 Other long term (current) drug therapy: Secondary | ICD-10-CM | POA: Diagnosis not present

## 2015-12-10 DIAGNOSIS — Z5181 Encounter for therapeutic drug level monitoring: Secondary | ICD-10-CM | POA: Diagnosis not present

## 2015-12-10 LAB — IRON AND TIBC
Iron: 32 ug/dL (ref 28–170)
Saturation Ratios: 13 % (ref 10.4–31.8)
TIBC: 242 ug/dL — ABNORMAL LOW (ref 250–450)
UIBC: 210 ug/dL

## 2015-12-10 LAB — POCT HEMOGLOBIN-HEMACUE: Hemoglobin: 9.7 g/dL — ABNORMAL LOW (ref 12.0–15.0)

## 2015-12-10 LAB — FERRITIN: Ferritin: 420 ng/mL — ABNORMAL HIGH (ref 11–307)

## 2015-12-10 MED ORDER — DARBEPOETIN ALFA 200 MCG/0.4ML IJ SOSY
PREFILLED_SYRINGE | INTRAMUSCULAR | Status: AC
  Administered 2015-12-10: 200 ug via SUBCUTANEOUS
  Filled 2015-12-10: qty 0.4

## 2015-12-10 MED ORDER — SODIUM CHLORIDE 0.9 % IV SOLN
510.0000 mg | Freq: Once | INTRAVENOUS | Status: AC
  Administered 2015-12-10: 510 mg via INTRAVENOUS
  Filled 2015-12-10: qty 17

## 2015-12-10 MED ORDER — DARBEPOETIN ALFA 200 MCG/0.4ML IJ SOSY: 200.0000 ug | PREFILLED_SYRINGE | INTRAMUSCULAR | Status: DC

## 2015-12-11 ENCOUNTER — Ambulatory Visit: Payer: Medicare Other | Attending: Orthopaedic Surgery | Admitting: Physical Therapy

## 2015-12-11 DIAGNOSIS — R609 Edema, unspecified: Secondary | ICD-10-CM | POA: Insufficient documentation

## 2015-12-11 DIAGNOSIS — R262 Difficulty in walking, not elsewhere classified: Secondary | ICD-10-CM | POA: Diagnosis not present

## 2015-12-11 DIAGNOSIS — M25612 Stiffness of left shoulder, not elsewhere classified: Secondary | ICD-10-CM | POA: Insufficient documentation

## 2015-12-11 DIAGNOSIS — R600 Localized edema: Secondary | ICD-10-CM | POA: Diagnosis not present

## 2015-12-11 DIAGNOSIS — M25562 Pain in left knee: Secondary | ICD-10-CM | POA: Insufficient documentation

## 2015-12-12 NOTE — Therapy (Signed)
[place], [place], [address] Phone: [phone]   Fax:  [phone]  Physical Therapy Evaluation  Patient Details  Name: Brandy Houston MRN: [id number] Date of Birth: 11/09/1955 Referring Provider: Dr. [name]   Encounter Date: 12/11/2015      PT End of Session - 12/12/15 0718    Visit Number 1   Number of Visits 16   Date for PT Re-Evaluation 02/06/16   PT Start Time [id number]   PT Stop Time 1635   PT Time Calculation (min) 50 min   Activity Tolerance Patient tolerated treatment well   Behavior During Therapy [facility] for tasks assessed/performed      Past Medical History  Diagnosis Date  . Hypertension   . Peripheral neuropathy ([place])   . Hepatitis C 2016  . Diabetes mellitus     type 2, IDDM  . Depression     Chronic  . Anxiety   . GERD (gastroesophageal reflux disease)   . Septic arthritis ([place])   . Dysrhythmia     tachycardia, normal ECHO 08-09-14  . Anemia   . Chronic kidney disease     stage IV. previous HD, none currently 10/30/15  . Unsteady gait   . Osteoarthritis of left knee   . Protein calorie malnutrition ([place])   . Chronic diastolic CHF (congestive heart failure) ([employer])   . Diabetic peripheral neuropathy ([place])   . Slow transit constipation     Past Surgical History  Procedure Laterality Date  . Cholecystectomy    . Appendectomy    . Small intestine surgery      Due to Small Bowel Obstruction  . Tubal ligation    . Knee arthroscopy Right 08/10/2014    Procedure: ARTHROSCOPY I & D KNEE;  Surgeon: Marianna Payment, MD;  [facility]: [facility];  Service: Orthopedics;  Laterality: Right;  . Shoulder arthroscopy Bilateral 08/10/2014    Procedure: I & D BILATERAL SHOULDERS ;  Surgeon: Marianna Payment, MD;  [facility];  Service: Orthopedics;  Laterality: Bilateral;  . Tenosynovectomy Right 08/11/2014    Procedure: RIGHT WRIST IRRIGATION AND DEBRIDEMENT, TENOSYNOVECTOMY;  Surgeon: Marianna Payment, MD;  Location: [place];  Service: Orthopedics;  Laterality: Right;  . Knee arthroscopy Left 08/11/2014    Procedure: ARTHROSCOPIC WASHOUT LEFT KNEE;  Surgeon: Marianna Payment, MD;  Location: [place];  Service: Orthopedics;  Laterality: Left;  . Tee without cardioversion N/A 08/14/2014    Procedure: TRANSESOPHAGEAL ECHOCARDIOGRAM (TEE);  Surgeon: Thayer Headings, MD;  Location: [place];  Service: Cardiovascular;  Laterality: N/A;  . Knee arthroscopy Left 08/19/2014    Procedure: ARTHROSCOPIC WASHOUT LEFT KNEE;  Surgeon: Leandrew Koyanagi, MD;  Location: [place];  Service: Orthopedics;  Laterality: Left;  . Av fistula placement Left 09/13/2014    Procedure: Brachial Artery to Brachial Vein Gortex Four - Seven Stretch GRAFT INSERTION Left Forearm;  Surgeon: Mal Misty, MD;  Location: [place];  Service: Vascular;  Laterality: Left;  . Knee arthroscopy with medial menisectomy Left 04/04/2015    Procedure: LEFT KNEE ARTHROSCOPY WITH PARTIAL MEDIAL MENISCECTOMY  AND SYNOVECTOMY;  Surgeon: Leandrew Koyanagi, MD;  Location: [place];  Service: Orthopedics;  Laterality: Left;  . Knee arthroscopy with lateral menisectomy Left 04/04/2015    Procedure: AND PARTIAL LATERAL MENISECTOMY;  Surgeon: Leandrew Koyanagi, MD;  Location: [place];  Service: Orthopedics;  Laterality: Left;  . Total knee arthroplasty Left 11/07/2015  Procedure: LEFT TOTAL KNEE ARTHROPLASTY WITH REVISION OF IMPLANTS;  Surgeon: Leandrew Koyanagi, MD;  Location: [place];  Service: Orthopedics;  Laterality: Left;    There were no vitals filed for this visit.       Subjective Assessment - 12/11/15 1554    Subjective Pt underwent L TKR on 11/07/15 for end stage OA failing conservative measures.  Prior to surgery she was unable to walk without severe pain, avoided it for the most part. She was in SNF for 21 days.  She continues to have difficulty walking, bend knee and is very limited by LE edema from chronic health  problems as well as trauma of surgery.    Pertinent History kidney disease, DM, HTN   Limitations Standing;Walking;House hold activities;Other (comment);Sitting;Lifting  sleeping    How long can you stand comfortably? with normal household duties, pain is controlled    How long can you walk comfortably? stays in home and is OK, walked yesterday and pain was 8/10.  20 min    Patient Stated Goals Pt wants to be able to walk without a walker, move around as she used to    Currently in Pain? Yes   Pain Score 5    Pain Location Knee   Pain Orientation Left   Pain Descriptors / Indicators Tightness;Sharp;Sore   Pain Type Surgical pain   Pain Radiating Towards shin    Pain Onset 1 to 4 weeks ago   Pain Frequency Intermittent   Aggravating Factors  bending, walking too much   Pain Relieving Factors rest, Ice, pain meds   Effect of Pain on Daily Activities slows her down, can't do housework             [facility] PT Assessment - 12/11/15 1601    Assessment   Medical Diagnosis L TKR    Referring Provider Dr. [name]    Onset Date/Surgical Date 11/07/15   Prior Therapy [facility]   Precautions   Precautions None   Restrictions   Weight Bearing Restrictions No   Balance Screen   Has the patient fallen in the past 6 months No  reports balance needs work    Has the patient had a decrease in activity level because of a fear of falling?  Yes   Is the patient reluctant to leave their home because of a fear of falling?  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   Leisure crochet, gardening   Cognition   Overall Cognitive Status Within Functional Limits for tasks assessed   Observation/Other Assessments-Edema     Edema Circumferential   Circumferential Edema   Circumferential - Left  19, 19.25 and 17 (superior, patellar and inferior respectively)    Sensation   Light Touch Impaired by gross assessment   Additional Comments decr sensation along proximal incision and laterally   Posture/Postural Control   Posture/Postural Control Postural limitations   Postural Limitations Rounded Shoulders;Forward head;Flexed trunk;Weight shift right   Posture Comments knees hyperext in stance   AROM   Right Knee Extension 0   Right Knee Flexion 125   Left Knee Extension 0   Left Knee Flexion 85  90 with AAROM    Strength   Right Hip  Flexion 3+/5   Left Hip Flexion 3+/5   Right Knee Flexion 4+/5   Right Knee Extension 4+/5   Left Knee Flexion 3+/5   Left Knee Extension 4/5   Right Ankle Dorsiflexion 4+/5   Left Ankle Dorsiflexion 3+/5   Palpation   Patella mobility good   Palpation comment pitting edema +2 bilateral LEs, skin shiny and tight thorughout LLE including L thigh   Transfers   Transfers Sit to Stand;Supine to Sit   Sit to Stand 6: Modified independent (Device/Increase time)   Supine to Sit 6: Modified independent (Device/Increase time)   Ambulation/Gait   Ambulation Distance (Feet) 100 Feet   Assistive device Rollator   Gait Pattern Step-to pattern;Decreased weight shift to left;Right genu recurvatum;Left genu recurvatum;Antalgic;Lateral trunk lean to right   Ambulation Surface Level;Indoor            [facility] Adult PT Treatment/Exercise - 12/12/15 0001    Manual Therapy   Manual Therapy Edema management   Edema Management retromassage with leg elevated   Kinesiotex Edema            PT Education - 12/12/15 0651    Education provided Yes   Education Details PT/POC, AAROM for L knee, edema and elevation, tape for swelling, RICE    Person(s) Educated Patient   Methods Explanation;Demonstration;Tactile cues;Verbal cues   Comprehension Verbalized understanding;Returned  demonstration;Need further instruction          PT Short Term Goals - 12/12/15 1414    PT SHORT TERM GOAL #1   Title Pt will be able to be I with HEP for knee AROM/AAROM    Time 4   Period Weeks   Status New   PT SHORT TERM GOAL #2   Title Pt will be able to bend knee in sitting to 95 deg for improved transfers   Time 4   Period Weeks   Status New   PT SHORT TERM GOAL #3   Title Pt will be able to understand RICE, edema mgmt    Time 4   Period Weeks   Status New           PT Long Term Goals - 12/12/15 1610    PT LONG TERM GOAL #1   Title Pt will be able to be I with more advanced HEP    Time 8   Period Weeks   Status New   PT LONG TERM GOAL #2   Title Pt will score FOTO less than 48% limited   Time 8   Period Weeks   Status New   PT LONG TERM GOAL #3   Title Pt will be able to walk with LRAD in community as needed with min increase in knee pain.    Time 8   Period Weeks   Status New   PT LONG TERM GOAL #4   Title Pt will demo knee AROM flexion to 110 deg for improved transfers.    Time 8   Period Weeks   Status New   PT LONG TERM GOAL #5   Title Pt will demo 4+/5 in L knee for improved stability with gait, function with stairs.    Time 8   Period Weeks   Status New   Additional Long Term Goals   Additional Long Term Goals Yes   PT LONG TERM GOAL #6   Title Balance goal to be assessed.    Time 8   Period Weeks   Status New  Plan - 2016-01-11 1401    Clinical Impression Statement Patient presents for low complexity eval (stable) of L TKA .  Her recovery will be greatly impacted by the degree of LE swelling she has from chronic kidney disease.  Addressed edema today during assessment. She should do well but may take increased time to accomplish ROM goals.    Rehab Potential Good   Clinical Impairments Affecting Rehab Potential LE edema bilateral L>R   PT Frequency 2x / week   PT Duration 8 weeks   PT Treatment/Interventions  ADLs/Self Care Home Management;Balance training;Manual lymph drainage;Neuromuscular re-education;Scar mobilization;DME Instruction;Patient/family education;Gait training;Stair training;Passive range of motion;Functional mobility training;Electrical Stimulation;Cryotherapy;Therapeutic exercise;Therapeutic activities;Manual techniques;Vasopneumatic Device;Taping   PT Next Visit Plan AAROM, edema mgmt, Nustep, balance screen    PT Home Exercise Plan reviewed AAROM with sheet for knee flexion today   Consulted and Agree with Plan of Care Patient      Patient will benefit from skilled therapeutic intervention in order to improve the following deficits and impairments:  Abnormal gait, Decreased activity tolerance, Decreased balance, Decreased mobility, Decreased strength, Increased edema, Impaired sensation, Postural dysfunction, Impaired flexibility, Hypomobility, Decreased scar mobility, Decreased skin integrity, Pain, Decreased range of motion, Difficulty walking, Increased fascial restricitons  Visit Diagnosis: Edema, unspecified  Localized edema  Pain in left knee  Stiffness of left shoulder, not elsewhere classified  Difficulty in walking, not elsewhere classified      G-Codes - 2016/01/11 1617    Functional Assessment Tool Used FOTO    Functional Limitation Mobility: Walking and moving around   Mobility: Walking and Moving Around Current Status [phone]) At least 60 percent but less than 80 percent impaired, limited or restricted   Mobility: Walking and Moving Around Goal Status [phone]) At least 40 percent but less than 60 percent impaired, limited or restricted       Problem List Patient Active Problem List   Diagnosis Date Noted  . Chronic diastolic CHF (congestive heart failure) ([place]) 11/08/2015  . Uncontrolled type 2 diabetes mellitus with complication ([place])   . Status post total left knee replacement   . Essential hypertension   . Acute blood loss anemia   . Diabetic  peripheral neuropathy ([place])   . Anxiety state   . Depression   . Knee joint replacement status 11/07/2015  . Total knee replacement status 11/07/2015  . Normocytic anemia 11/07/2015  . Diabetes mellitus with complication ([place])   . Chronic kidney disease (CKD), stage IV (severe) ([place])   . Postoperative surgical complication involving circulatory system   . Physical debility 09/14/2014  . Acute renal failure syndrome ([place])   . Hep C w/ coma, chronic ([place])   . Joint infection ([place])   . Acute renal failure ([place])   . Hepatitis C 08/16/2014  . SIRS (systemic inflammatory response syndrome) ([name])   . Benign essential HTN   . Septic joint of right hand ([place])   . Staphylococcus aureus bacteremia   . Septic joint of right knee joint ([place])   . ESRD (end stage renal disease) on dialysis ([place])   . Tachycardia 08/07/2014  . Septic joint of left shoulder region ([place]) 08/07/2014  . Sinus tachycardia ([place]) 08/07/2014  . BP (high blood pressure) 06/24/2013  . Disorder of peripheral nervous system ([place]) 06/24/2013  . 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 Diabetes mellitus, type 2 ([place]) 06/24/2013  . Uncontrolled hypertension 02/18/2013  . Type II or unspecified type diabetes mellitus without mention of complication, uncontrolled 02/18/2013  .  Chest pain at rest 10/18/2011    Class: Acute  . Shortness of breath 10/18/2011    Class: Acute    PAA,JENNIFER 12/12/2015, 4:22 PM  [facility] [address] [place], [place], [address] Phone: [phone]   Fax:  [phone]  Name: KAIANNA NAGENGAST MRN: [id number] Date of Birth: 28-Nov-1955  Raeford Razor, PT 12/12/2015 4:22 PM Phone: [phone] Fax: [phone]

## 2015-12-21 DIAGNOSIS — M25572 Pain in left ankle and joints of left foot: Secondary | ICD-10-CM | POA: Diagnosis not present

## 2015-12-24 ENCOUNTER — Encounter: Payer: Medicare Other | Admitting: Physical Therapy

## 2015-12-24 ENCOUNTER — Ambulatory Visit: Payer: Medicare Other | Attending: Orthopaedic Surgery | Admitting: Physical Therapy

## 2015-12-24 DIAGNOSIS — R600 Localized edema: Secondary | ICD-10-CM | POA: Diagnosis not present

## 2015-12-24 DIAGNOSIS — R262 Difficulty in walking, not elsewhere classified: Secondary | ICD-10-CM | POA: Insufficient documentation

## 2015-12-24 DIAGNOSIS — M25612 Stiffness of left shoulder, not elsewhere classified: Secondary | ICD-10-CM | POA: Diagnosis not present

## 2015-12-24 DIAGNOSIS — M25562 Pain in left knee: Secondary | ICD-10-CM | POA: Diagnosis not present

## 2015-12-24 DIAGNOSIS — R609 Edema, unspecified: Secondary | ICD-10-CM

## 2015-12-24 NOTE — Therapy (Signed)
[place], [place], [address] Phone: [phone]   Fax:  [phone]  Physical Therapy Treatment  Patient Details  Name: Brandy Houston MRN: [id number] Date of Birth: 19-Jul-1955 Referring Provider: Dr. [name]   Encounter Date: 12/24/2015      PT End of Session - 12/24/15 1803    Visit Number 2   Number of Visits 16   Date for PT Re-Evaluation 02/06/16   PT Start Time 1500   PT Stop Time 1545   PT Time Calculation (min) 45 min   Activity Tolerance Patient tolerated treatment well   Behavior During Therapy [facility] for tasks assessed/performed      Past Medical History  Diagnosis Date  . Hypertension   . Peripheral neuropathy ([place])   . Hepatitis C 2016  . Diabetes mellitus     type 2, IDDM  . Depression     Chronic  . Anxiety   . GERD (gastroesophageal reflux disease)   . Septic arthritis ([place])   . Dysrhythmia     tachycardia, normal ECHO 08-09-14  . Anemia   . Chronic kidney disease     stage IV. previous HD, none currently 10/30/15  . Unsteady gait   . Osteoarthritis of left knee   . Protein calorie malnutrition ([place])   . Chronic diastolic CHF (congestive heart failure) ([place])   . Diabetic peripheral neuropathy ([place])   . Slow transit constipation     Past Surgical History  Procedure Laterality Date  . Cholecystectomy    . Appendectomy    . Small intestine surgery      Due to Small Bowel Obstruction  . Tubal ligation    . Knee arthroscopy Right 08/10/2014    Procedure: ARTHROSCOPY I & D KNEE;  Surgeon: Marianna Payment, MD;  [facility];  Service: Orthopedics;  Laterality: Right;  . Shoulder arthroscopy Bilateral 08/10/2014    Procedure: I & D BILATERAL SHOULDERS ;  Surgeon: Marianna Payment, MD;  [facility];  Service: Orthopedics;  Laterality: Bilateral;  . Tenosynovectomy Right 08/11/2014    Procedure: RIGHT WRIST IRRIGATION AND DEBRIDEMENT, TENOSYNOVECTOMY;  Surgeon: Marianna Payment, MD;  Location: [place];  Service: Orthopedics;  Laterality: Right;  . Knee arthroscopy Left 08/11/2014    Procedure: ARTHROSCOPIC WASHOUT LEFT KNEE;  Surgeon: Marianna Payment, MD;  Location: [place];  Service: Orthopedics;  Laterality: Left;  . Tee without cardioversion N/A 08/14/2014    Procedure: TRANSESOPHAGEAL ECHOCARDIOGRAM (TEE);  Surgeon: Thayer Headings, MD;  Location: [place];  Service: Cardiovascular;  Laterality: N/A;  . Knee arthroscopy Left 08/19/2014    Procedure: ARTHROSCOPIC WASHOUT LEFT KNEE;  Surgeon: Leandrew Koyanagi, MD;  Location: [place];  Service: Orthopedics;  Laterality: Left;  . Av fistula placement Left 09/13/2014    Procedure: Brachial Artery to Brachial Vein Gortex Four - Seven Stretch GRAFT INSERTION Left Forearm;  Surgeon: Mal Misty, MD;  Location: [place];  Service: Vascular;  Laterality: Left;  . Knee arthroscopy with medial menisectomy Left 04/04/2015    Procedure: LEFT KNEE ARTHROSCOPY WITH PARTIAL MEDIAL MENISCECTOMY  AND SYNOVECTOMY;  Surgeon: Leandrew Koyanagi, MD;  Location: [place];  Service: Orthopedics;  Laterality: Left;  . Knee arthroscopy with lateral menisectomy Left 04/04/2015    Procedure: AND PARTIAL LATERAL MENISECTOMY;  Surgeon: Leandrew Koyanagi, MD;  Location: [place];  Service: Orthopedics;  Laterality: Left;  . Total knee arthroplasty Left 11/07/2015  Procedure: LEFT TOTAL KNEE ARTHROPLASTY WITH REVISION OF IMPLANTS;  Surgeon: Leandrew Koyanagi, MD;  Location: [place];  Service: Orthopedics;  Laterality: Left;    There were no vitals filed for this visit.      Subjective Assessment - 12/24/15 1511    Subjective New script for Posterior tibial tendonitis. 5/10 knee pain.    Currently in Pain? Yes   Pain Score 5    Pain Location Knee   Pain Orientation Left   Pain Radiating Towards shin   Aggravating Factors  walking, bending   Pain Relieving Factors medication, ice, massage,  elevation   Multiple Pain  Sites --  5/10 left ankle pain.            [facility] PT Assessment - 12/24/15 0001    AROM   Left Knee Flexion --  AA 105   Berg Balance Test   Sit to Stand Able to stand  independently using hands   Standing Unsupported Able to stand safely 2 minutes   Sitting with Back Unsupported but Feet Supported on Floor or Stool Able to sit safely and securely 2 minutes   Stand to Sit Sits safely with minimal use of hands   Transfers Able to transfer safely, definite need of hands   Standing Unsupported with Eyes Closed Able to stand 10 seconds with supervision   Standing Ubsupported with Feet Together Able to place feet together independently and stand for 1 minute with supervision   From Standing, Reach Forward with Outstretched Arm Can reach forward >12 cm safely (5")   From Standing Position, Pick up Object from [place] to pick up shoe safely and easily   From Standing Position, Turn to Look Behind Over each Shoulder Looks behind one side only/other side shows less weight shift   Turn 360 Degrees Needs assistance while turning   Standing Unsupported, Alternately Place Feet on Step/Stool Able to complete >2 steps/needs minimal assist   Standing Unsupported, One Foot in Front Able to take small step independently and hold 30 seconds   Standing on One Leg Tries to lift leg/unable to hold 3 seconds but remains standing independently   Total Score 38                     [facility] Adult PT Treatment/Exercise - 12/24/15 0001    Knee/Hip Exercises: Aerobic   Nustep L4, 7 minutes   Knee/Hip Exercises: Supine   Heel Slides Limitations 10 x, painful end range.   Other Supine Knee/Hip Exercises self mobs using towel roll  able to increase flexion to 105   Manual Therapy   Manual Therapy Edema management   Edema Management retromassage with leg elevated  starting at groin,  Tissue softened thigh and knee.    Kinesiotex Edema   Kinesiotix   Edema 2 fand medial latreral knee.                     PT Short Term Goals - 12/24/15 1808    PT SHORT TERM GOAL #1   Title Pt will be able to be I with HEP for knee AROM/AAROM    Time 4   Period Weeks   Status On-going   PT SHORT TERM GOAL #2   Title Pt will be able to bend knee in sitting to 95 deg for improved transfers   Time 4   Period Weeks   Status On-going   PT SHORT TERM GOAL #3   Title  Pt will be able to understand RICE, edema mgmt    Baseline education continued with this today,  has been doing part of what she could do.    Time 4   Period Weeks   Status On-going           PT Long Term Goals - 12/12/15 1610    PT LONG TERM GOAL #1   Title Pt will be able to be I with more advanced HEP    Time 8   Period Weeks   Status New   PT LONG TERM GOAL #2   Title Pt will score FOTO less than 48% limited   Time 8   Period Weeks   Status New   PT LONG TERM GOAL #3   Title Pt will be able to walk with LRAD in community as needed with min increase in knee pain.    Time 8   Period Weeks   Status New   PT LONG TERM GOAL #4   Title Pt will demo knee AROM flexion to 110 deg for improved transfers.    Time 8   Period Weeks   Status New   PT LONG TERM GOAL #5   Title Pt will demo 4+/5 in L knee for improved stability with gait, function with stairs.    Time 8   Period Weeks   Status New   Additional Long Term Goals   Additional Long Term Goals Yes   PT LONG TERM GOAL #6   Title Balance goal to be assessed.    Time 8   Period Weeks   Status New               Plan - 12/24/15 1805    Clinical Impression Statement BERG:  38/56.  Flexion AA to 105.  Progress toward ROM goals.  Pain 5/10 at end of session,  Declined ICE.   PT Next Visit Plan Posterior tibial tendonitis eval.  New scrip to be scanned or talk with Santiago Glad.  Step stretch,  walker stretch,  wall slides with weight bearing.  edema control.  PT to interpert BERG score.   PT Home Exercise Plan Try self Mobs,  RICE, Retrograde.    Consulted and Agree with Plan of Care Patient      Patient will benefit from skilled therapeutic intervention in order to improve the following deficits and impairments:  Abnormal gait, Decreased activity tolerance, Decreased balance, Decreased mobility, Decreased strength, Increased edema, Impaired sensation, Postural dysfunction, Impaired flexibility, Hypomobility, Decreased scar mobility, Decreased skin integrity, Pain, Decreased range of motion, Difficulty walking, Increased fascial restricitons  Visit Diagnosis: Edema, unspecified  Localized edema  Pain in left knee  Stiffness of left shoulder, not elsewhere classified  Difficulty in walking, not elsewhere classified     Problem List Patient Active Problem List   Diagnosis Date Noted  . Chronic diastolic CHF (congestive heart failure) ([place]) 11/08/2015  . Uncontrolled type 2 diabetes mellitus with complication ([place])   . Status post total left knee replacement   . Essential hypertension   . Acute blood loss anemia   . Diabetic peripheral neuropathy ([place])   . Anxiety state   . Depression   . Knee joint replacement status 11/07/2015  . Total knee replacement status 11/07/2015  . Normocytic anemia 11/07/2015  . Diabetes mellitus with complication ([place])   . Chronic kidney disease (CKD), stage IV (severe) ([place])   . Postoperative surgical complication involving circulatory system   . Physical debility 09/14/2014  .  Acute renal failure syndrome ([place])   . Hep C w/ coma, chronic ([place])   . Joint infection ([place])   . Acute renal failure ([place])   . Hepatitis C 08/16/2014  . SIRS (systemic inflammatory response syndrome) ([name])   . Benign essential HTN   . Septic joint of right hand ([place])   . Staphylococcus aureus bacteremia   . Septic joint of right knee joint ([place])   . ESRD (end stage renal disease) on dialysis ([place])   . Tachycardia 08/07/2014  . Septic joint of left shoulder region ([place]) 08/07/2014  . Sinus tachycardia ([place])  08/07/2014  . BP (high blood pressure) 06/24/2013  . Disorder of peripheral nervous system ([place]) 06/24/2013  . 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 Diabetes mellitus, type 2 ([place]) 06/24/2013  . Uncontrolled hypertension 02/18/2013  . Type II or unspecified type diabetes mellitus without mention of complication, uncontrolled 02/18/2013  . Chest pain at rest 10/18/2011    Class: Acute  . Shortness of breath 10/18/2011    Class: Acute    HARRIS,KAREN 12/24/2015, 6:10 PM  [facility] [address] [place], [place], [address] Phone: [phone]   Fax:  [phone]  Name: Brandy Houston MRN: [id number] Date of Birth: 03-Aug-1955    Melvenia Needles, PTA 12/24/2015 6:10 PM Phone: [phone] Fax: [phone]

## 2015-12-26 ENCOUNTER — Ambulatory Visit: Payer: Medicare Other | Admitting: Physical Therapy

## 2015-12-26 DIAGNOSIS — M25562 Pain in left knee: Secondary | ICD-10-CM

## 2015-12-26 DIAGNOSIS — M25612 Stiffness of left shoulder, not elsewhere classified: Secondary | ICD-10-CM

## 2015-12-26 DIAGNOSIS — R600 Localized edema: Secondary | ICD-10-CM

## 2015-12-26 DIAGNOSIS — R262 Difficulty in walking, not elsewhere classified: Secondary | ICD-10-CM | POA: Diagnosis not present

## 2015-12-26 DIAGNOSIS — R609 Edema, unspecified: Secondary | ICD-10-CM

## 2015-12-26 NOTE — Therapy (Signed)
[place], [place], [address] Phone: [phone]   Fax:  [phone]  Physical Therapy Treatment  Patient Details  Name: Brandy Houston MRN: [id number] Date of Birth: 07/04/1955 Referring Provider: Dr. [name]   Encounter Date: 12/26/2015      PT End of Session - 12/26/15 1546    PT Start Time 1450   PT Stop Time 1550   PT Time Calculation (min) 60 min   Activity Tolerance Patient tolerated treatment well   Behavior During Therapy [facility] for tasks assessed/performed      Past Medical History  Diagnosis Date  . Hypertension   . Peripheral neuropathy ([place])   . Hepatitis C 2016  . Diabetes mellitus     type 2, IDDM  . Depression     Chronic  . Anxiety   . GERD (gastroesophageal reflux disease)   . Septic arthritis ([place])   . Dysrhythmia     tachycardia, normal ECHO 08-09-14  . Anemia   . Chronic kidney disease     stage IV. previous HD, none currently 10/30/15  . Unsteady gait   . Osteoarthritis of left knee   . Protein calorie malnutrition ([place])   . Chronic diastolic CHF (congestive heart failure) ([place])   . Diabetic peripheral neuropathy ([place])   . Slow transit constipation     Past Surgical History  Procedure Laterality Date  . Cholecystectomy    . Appendectomy    . Small intestine surgery      Due to Small Bowel Obstruction  . Tubal ligation    . Knee arthroscopy Right 08/10/2014    Procedure: ARTHROSCOPY I & D KNEE;  Surgeon: Marianna Payment, MD;  [facility];  Service: Orthopedics;  Laterality: Right;  . Shoulder arthroscopy Bilateral 08/10/2014    Procedure: I & D BILATERAL SHOULDERS ;  Surgeon: Marianna Payment, MD;  [facility];  Service: Orthopedics;  Laterality: Bilateral;  . Tenosynovectomy Right 08/11/2014    Procedure: RIGHT WRIST IRRIGATION AND DEBRIDEMENT, TENOSYNOVECTOMY;  Surgeon: Marianna Payment, MD;  Location: [place];  Service: Orthopedics;  Laterality: Right;  .  Knee arthroscopy Left 08/11/2014    Procedure: ARTHROSCOPIC WASHOUT LEFT KNEE;  Surgeon: Marianna Payment, MD;  Location: [place];  Service: Orthopedics;  Laterality: Left;  . Tee without cardioversion N/A 08/14/2014    Procedure: TRANSESOPHAGEAL ECHOCARDIOGRAM (TEE);  Surgeon: Thayer Headings, MD;  Location: [place];  Service: Cardiovascular;  Laterality: N/A;  . Knee arthroscopy Left 08/19/2014    Procedure: ARTHROSCOPIC WASHOUT LEFT KNEE;  Surgeon: Leandrew Koyanagi, MD;  Location: [place];  Service: Orthopedics;  Laterality: Left;  . Av fistula placement Left 09/13/2014    Procedure: Brachial Artery to Brachial Vein Gortex Four - Seven Stretch GRAFT INSERTION Left Forearm;  Surgeon: Mal Misty, MD;  Location: [place];  Service: Vascular;  Laterality: Left;  . Knee arthroscopy with medial menisectomy Left 04/04/2015    Procedure: LEFT KNEE ARTHROSCOPY WITH PARTIAL MEDIAL MENISCECTOMY  AND SYNOVECTOMY;  Surgeon: Leandrew Koyanagi, MD;  Location: [place];  Service: Orthopedics;  Laterality: Left;  . Knee arthroscopy with lateral menisectomy Left 04/04/2015    Procedure: AND PARTIAL LATERAL MENISECTOMY;  Surgeon: Leandrew Koyanagi, MD;  Location: [place];  Service: Orthopedics;  Laterality: Left;  . Total knee arthroplasty Left 11/07/2015    Procedure: LEFT TOTAL KNEE ARTHROPLASTY WITH REVISION OF IMPLANTS;  Surgeon: Leandrew Koyanagi, MD;  Location: [place];  Service: Orthopedics;  Laterality: Left;    There were no vitals filed for this visit.      Subjective Assessment - 12/26/15 1458    Subjective 6-7/10 knee pain today   Currently in Pain? Yes   Pain Score 5    Pain Location Knee   Pain Orientation Left            [facility] PT Assessment - 12/26/15 0001    Circumferential Edema   Circumferential - Left  18.5 inches superior patella,  inferior patella  17 9/10                     [facility] Adult PT Treatment/Exercise - 12/26/15 0001    Knee/Hip Exercises:  Stretches   Other Knee/Hip Stretches "walker stretch"  10 X   Knee/Hip Exercises: Standing   Terminal Knee Extension 2 sets;10 reps  pressing yellow ball into wall.   Lateral Step Up Left;1 set;Hand Hold: 1;Step Height: 4"   Forward Step Up Left;1 set;10 reps;Hand Hold: 2;Step Height: 6"   Wall Squat Limitations 10 x small motions,  CGS,  cues   Knee/Hip Exercises: Supine   Quad Sets 10 reps   Short Arc Quad Sets 3 sets  0, 3, 5 pounds   Straight Leg Raises 10 reps  no quad lag   Cryotherapy   Number Minutes Cryotherapy 10 Minutes   Cryotherapy Location Knee   Type of Cryotherapy --  cold pack   Manual Therapy   Manual therapy comments Strap mobilization for flexion.  P/A and distal glides with patient pulling into flrexion.    Edema Management retromassage with leg elevated  starting at groin,  Tissue softened thigh and knee.                   PT Short Term Goals - 12/24/15 1808    PT SHORT TERM GOAL #1   Title Pt will be able to be I with HEP for knee AROM/AAROM    Time 4   Period Weeks   Status On-going   PT SHORT TERM GOAL #2   Title Pt will be able to bend knee in sitting to 95 deg for improved transfers   Time 4   Period Weeks   Status On-going   PT SHORT TERM GOAL #3   Title Pt will be able to understand RICE, edema mgmt    Baseline education continued with this today,  has been doing part of what she could do.    Time 4   Period Weeks   Status On-going           PT Long Term Goals - 12/12/15 1610    PT LONG TERM GOAL #1   Title Pt will be able to be I with more advanced HEP    Time 8   Period Weeks   Status New   PT LONG TERM GOAL #2   Title Pt will score FOTO less than 48% limited   Time 8   Period Weeks   Status New   PT LONG TERM GOAL #3   Title Pt will be able to walk with LRAD in community as needed with min increase in knee pain.    Time 8   Period Weeks   Status New   PT LONG TERM GOAL #4   Title Pt will demo knee AROM flexion  to 110 deg for improved transfers.    Time 8  Period Weeks   Status New   PT LONG TERM GOAL #5   Title Pt will demo 4+/5 in L knee for improved stability with gait, function with stairs.    Time 8   Period Weeks   Status New   Additional Long Term Goals   Additional Long Term Goals Yes   PT LONG TERM GOAL #6   Title Balance goal to be assessed.    Time 8   Period Weeks   Status New               Plan - 12/26/15 1547    Clinical Impression Statement Edema improving by ovre an inch.  AAROM 110 flexion.  Pain 4/10 at end of session   PT Next Visit Plan continue closed chain , terminal knee control, ROM,  Eval for posteriot tib.   PT Home Exercise Plan tru walker stretch   Consulted and Agree with Plan of Care Patient      Patient will benefit from skilled therapeutic intervention in order to improve the following deficits and impairments:  Abnormal gait, Decreased activity tolerance, Decreased balance, Decreased mobility, Decreased strength, Increased edema, Impaired sensation, Postural dysfunction, Impaired flexibility, Hypomobility, Decreased scar mobility, Decreased skin integrity, Pain, Decreased range of motion, Difficulty walking, Increased fascial restricitons  Visit Diagnosis: Edema, unspecified  Localized edema  Pain in left knee  Stiffness of left shoulder, not elsewhere classified     Problem List Patient Active Problem List   Diagnosis Date Noted  . Chronic diastolic CHF (congestive heart failure) ([place]) 11/08/2015  . Uncontrolled type 2 diabetes mellitus with complication ([place])   . Status post total left knee replacement   . Essential hypertension   . Acute blood loss anemia   . Diabetic peripheral neuropathy ([place])   . Anxiety state   . Depression   . Knee joint replacement status 11/07/2015  . Total knee replacement status 11/07/2015  . Normocytic anemia 11/07/2015  . Diabetes mellitus with complication ([place])   . Chronic kidney disease (CKD),  stage IV (severe) ([place])   . Postoperative surgical complication involving circulatory system   . Physical debility 09/14/2014  . Acute renal failure syndrome ([place])   . Hep C w/ coma, chronic ([place])   . Joint infection ([place])   . Acute renal failure ([place])   . Hepatitis C 08/16/2014  . SIRS (systemic inflammatory response syndrome) ([name])   . Benign essential HTN   . Septic joint of right hand ([place])   . Staphylococcus aureus bacteremia   . Septic joint of right knee joint ([place])   . ESRD (end stage renal disease) on dialysis ([place])   . Tachycardia 08/07/2014  . Septic joint of left shoulder region ([place]) 08/07/2014  . Sinus tachycardia ([place]) 08/07/2014  . BP (high blood pressure) 06/24/2013  . Disorder of peripheral nervous system ([place]) 06/24/2013  . 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 Diabetes mellitus, type 2 ([place]) 06/24/2013  . Uncontrolled hypertension 02/18/2013  . Type II or unspecified type diabetes mellitus without mention of complication, uncontrolled 02/18/2013  . Chest pain at rest 10/18/2011    Class: Acute  . Shortness of breath 10/18/2011    Class: Acute    Brandy Houston 12/26/2015, 3:49 PM  [facility] [address] [place], [place], [address] Phone: [phone]   Fax:  [phone]  Name: Brandy Houston MRN: [id number] Date of Birth: July 25, 1955    Melvenia Needles, PTA 12/26/2015 3:49 PM Phone: [phone] Fax:  [phone]  

## 2015-12-27 ENCOUNTER — Other Ambulatory Visit: Payer: Self-pay | Admitting: Adult Health

## 2015-12-31 ENCOUNTER — Ambulatory Visit: Payer: Medicare Other | Admitting: Physical Therapy

## 2015-12-31 DIAGNOSIS — R609 Edema, unspecified: Secondary | ICD-10-CM

## 2015-12-31 DIAGNOSIS — R600 Localized edema: Secondary | ICD-10-CM | POA: Diagnosis not present

## 2015-12-31 DIAGNOSIS — M25562 Pain in left knee: Secondary | ICD-10-CM | POA: Diagnosis not present

## 2015-12-31 DIAGNOSIS — R262 Difficulty in walking, not elsewhere classified: Secondary | ICD-10-CM | POA: Diagnosis not present

## 2015-12-31 DIAGNOSIS — M25612 Stiffness of left shoulder, not elsewhere classified: Secondary | ICD-10-CM | POA: Diagnosis not present

## 2015-12-31 NOTE — Patient Instructions (Signed)
Calf Stretch    With towel around forefoot, keep knee resting on small floded towel and keep heel on the bed.  Do not plasce pillow under heel as shown. straight and pull back on towel until a stretch is felt in the calf. Hold ___20_ seconds. Repeat ___3_ times. Do _1___ sessions per day.  Copyright  VHI. All rights reserved.

## 2015-12-31 NOTE — Therapy (Signed)
[place], [place], [address] Phone: [phone]   Fax:  [phone]  Physical Therapy Treatment  Patient Details  Name: Brandy Houston MRN: [id number] Date of Birth: 1955/06/18 Referring Provider: Dr. [name]   Encounter Date: 12/31/2015      PT End of Session - 12/31/15 1615    Visit Number 3   Number of Visits 16   Date for PT Re-Evaluation 02/06/16   PT Start Time 1500   PT Stop Time 1600   PT Time Calculation (min) 60 min   Activity Tolerance Patient tolerated treatment well   Behavior During Therapy [facility] for tasks assessed/performed      Past Medical History  Diagnosis Date  . Hypertension   . Peripheral neuropathy ([place])   . Hepatitis C 2016  . Diabetes mellitus     type 2, IDDM  . Depression     Chronic  . Anxiety   . GERD (gastroesophageal reflux disease)   . Septic arthritis ([place])   . Dysrhythmia     tachycardia, normal ECHO 08-09-14  . Anemia   . Chronic kidney disease     stage IV. previous HD, none currently 10/30/15  . Unsteady gait   . Osteoarthritis of left knee   . Protein calorie malnutrition ([place])   . Chronic diastolic CHF (congestive heart failure) ([place])   . Diabetic peripheral neuropathy ([place])   . Slow transit constipation     Past Surgical History  Procedure Laterality Date  . Cholecystectomy    . Appendectomy    . Small intestine surgery      Due to Small Bowel Obstruction  . Tubal ligation    . Knee arthroscopy Right 08/10/2014    Procedure: ARTHROSCOPY I & D KNEE;  Surgeon: Marianna Payment, MD;  [facility];  Service: Orthopedics;  Laterality: Right;  . Shoulder arthroscopy Bilateral 08/10/2014    Procedure: I & D BILATERAL SHOULDERS ;  Surgeon: Marianna Payment, MD;  [facility];  Service: Orthopedics;  Laterality: Bilateral;  . Tenosynovectomy Right 08/11/2014    Procedure: RIGHT WRIST IRRIGATION AND DEBRIDEMENT, TENOSYNOVECTOMY;  Surgeon: Marianna Payment, MD;  Location: [place];  Service: Orthopedics;  Laterality: Right;  . Knee arthroscopy Left 08/11/2014    Procedure: ARTHROSCOPIC WASHOUT LEFT KNEE;  Surgeon: Marianna Payment, MD;  Location: [place];  Service: Orthopedics;  Laterality: Left;  . Tee without cardioversion N/A 08/14/2014    Procedure: TRANSESOPHAGEAL ECHOCARDIOGRAM (TEE);  Surgeon: Thayer Headings, MD;  Location: [place];  Service: Cardiovascular;  Laterality: N/A;  . Knee arthroscopy Left 08/19/2014    Procedure: ARTHROSCOPIC WASHOUT LEFT KNEE;  Surgeon: Leandrew Koyanagi, MD;  Location: [place];  Service: Orthopedics;  Laterality: Left;  . Av fistula placement Left 09/13/2014    Procedure: Brachial Artery to Brachial Vein Gortex Four - Seven Stretch GRAFT INSERTION Left Forearm;  Surgeon: Mal Misty, MD;  Location: [place];  Service: Vascular;  Laterality: Left;  . Knee arthroscopy with medial menisectomy Left 04/04/2015    Procedure: LEFT KNEE ARTHROSCOPY WITH PARTIAL MEDIAL MENISCECTOMY  AND SYNOVECTOMY;  Surgeon: Leandrew Koyanagi, MD;  Location: [place];  Service: Orthopedics;  Laterality: Left;  . Knee arthroscopy with lateral menisectomy Left 04/04/2015    Procedure: AND PARTIAL LATERAL MENISECTOMY;  Surgeon: Leandrew Koyanagi, MD;  Location: [place];  Service: Orthopedics;  Laterality: Left;  . Total knee arthroplasty Left 11/07/2015  Procedure: LEFT TOTAL KNEE ARTHROPLASTY WITH REVISION OF IMPLANTS;  Surgeon: Leandrew Koyanagi, MD;  Location: [place];  Service: Orthopedics;  Laterality: Left;    There were no vitals filed for this visit.      Subjective Assessment - 12/31/15 1456    Subjective (p) 6/10.  Pain increased around 1:00 pm yesterday with walking.    Currently in Pain? (p) Yes   Pain Score (p) 6    Pain Location (p) Knee   Pain Orientation (p) Left;Lateral   Pain Descriptors / Indicators (p) --  painful popping.    Pain Frequency (p) Intermittent   Aggravating Factors  (p)  walking            [facility] PT Assessment - 12/31/15 0001    AROM   Left Knee Flexion --  AAROM 115                     [facility] Adult PT Treatment/Exercise - 12/31/15 0001    Ambulation/Gait   Gait Comments cues to keep knee slightly flexed vs hyperextension with poor terminal knee control,  Painful gait improved with cues.  Trial of bilateral heel lifts was not helpful with knee control or pain.     Knee/Hip Exercises: Stretches   Gastroc Stretch 3 reps;30 seconds   Gastroc Stretch Limitations standing and sitting with rolled towel under knee. (HEP)   Knee/Hip Exercises: Aerobic   Recumbent Bike Gentle stretches, partial to full revolution then able to rotate fast enough to turn machine on 2.5 minutes with 5 minutes on bike total.  Minimal assist to keep foot on pedal initially.    Knee/Hip Exercises: Standing   Forward Step Up 1 set;10 reps;Hand Hold: 2;Step Height: 6"   Forward Step Up Limitations heavy cues for knee control   Knee/Hip Exercises: Seated   Hamstring Curl --  3 reps due to pain increased knee, yellow bands   Knee/Hip Exercises: Supine   Heel Slides 5 reps;2 sets  strap used for flexion, patient uses   Patellar Mobs checked, non tight.    Leisure centre manager IFC   Electrical Stimulation Parameters 14   Electrical Stimulation Goals Pain  legs elevated   Vasopneumatic   Number Minutes Vasopneumatic  15 minutes   Vasopnuematic Location  Knee   Vasopneumatic Pressure Low   Vasopneumatic Temperature  32   Manual Therapy   Manual Therapy --  gentle retrograde and ROM stretches.     Kinesiotex --  Trial hjamstrings, and fascia lateral knee , not helpful.                 PT Education - 12/31/15 1614    Education provided Yes   Education Details gait: avoid hyperextension, calf stretch   Person(s) Educated Patient   Methods Explanation;Demonstration;Verbal  cues;Handout   Comprehension Verbalized understanding;Returned demonstration;Need further instruction          PT Short Term Goals - 12/31/15 1622    PT SHORT TERM GOAL #1   Title Pt will be able to be I with HEP for knee AROM/AAROM    Time 4   Period Weeks   Status On-going   PT SHORT TERM GOAL #2   Title Pt will be able to bend knee in sitting to 95 deg for improved transfers   Baseline able (&/17/2017)   Time 4   Period Weeks   Status Achieved   PT  SHORT TERM GOAL #3   Title Pt will be able to understand RICE, edema mgmt    Time 4   Period Weeks   Status Unable to assess           PT Long Term Goals - 12/12/15 1610    PT LONG TERM GOAL #1   Title Pt will be able to be I with more advanced HEP    Time 8   Period Weeks   Status New   PT LONG TERM GOAL #2   Title Pt will score FOTO less than 48% limited   Time 8   Period Weeks   Status New   PT LONG TERM GOAL #3   Title Pt will be able to walk with LRAD in community as needed with min increase in knee pain.    Time 8   Period Weeks   Status New   PT LONG TERM GOAL #4   Title Pt will demo knee AROM flexion to 110 deg for improved transfers.    Time 8   Period Weeks   Status New   PT LONG TERM GOAL #5   Title Pt will demo 4+/5 in L knee for improved stability with gait, function with stairs.    Time 8   Period Weeks   Status New   Additional Long Term Goals   Additional Long Term Goals Yes   PT LONG TERM GOAL #6   Title Balance goal to be assessed.    Time 8   Period Weeks   Status New               Plan - 12/31/15 1616    Clinical Impression Statement 115 AAROM flexion.  Flare pain posterior/lateral knee with hyperextension of knee with weightbearing.  Helped some with cues to keep knee flexed some.    PT Next Visit Plan continue calf stretch,  gait training terminal knee control,  ROM.  Treat pAIN as needed   PT Home Exercise Plan calf stretch   Consulted and Agree with Plan of Care Patient       Patient will benefit from skilled therapeutic intervention in order to improve the following deficits and impairments:  Abnormal gait, Decreased activity tolerance, Decreased balance, Decreased mobility, Decreased strength, Increased edema, Impaired sensation, Postural dysfunction, Impaired flexibility, Hypomobility, Decreased scar mobility, Decreased skin integrity, Pain, Decreased range of motion, Difficulty walking, Increased fascial restricitons  Visit Diagnosis: Edema, unspecified  Localized edema  Pain in left knee     Problem List Patient Active Problem List   Diagnosis Date Noted  . Chronic diastolic CHF (congestive heart failure) ([place]) 11/08/2015  . Uncontrolled type 2 diabetes mellitus with complication ([place])   . Status post total left knee replacement   . Essential hypertension   . Acute blood loss anemia   . Diabetic peripheral neuropathy ([place])   . Anxiety state   . Depression   . Knee joint replacement status 11/07/2015  . Total knee replacement status 11/07/2015  . Normocytic anemia 11/07/2015  . Diabetes mellitus with complication ([place])   . Chronic kidney disease (CKD), stage IV (severe) ([place])   . Postoperative surgical complication involving circulatory system   . Physical debility 09/14/2014  . Acute renal failure syndrome ([place])   . Hep C w/ coma, chronic ([place])   . Joint infection ([place])   . Acute renal failure ([place])   . Hepatitis C 08/16/2014  . SIRS (systemic inflammatory response syndrome) ([name])   .  Benign essential HTN   . Septic joint of right hand ([place])   . Staphylococcus aureus bacteremia   . Septic joint of right knee joint ([place])   . ESRD (end stage renal disease) on dialysis ([place])   . Tachycardia 08/07/2014  . Septic joint of left shoulder region ([place]) 08/07/2014  . Sinus tachycardia ([place]) 08/07/2014  . BP (high blood pressure) 06/24/2013  . Disorder of peripheral nervous system ([place]) 06/24/2013  . Compulsive tobacco user syndrome  06/24/2013  . Diabetes mellitus, type 2 ([place]) 06/24/2013  . Uncontrolled hypertension 02/18/2013  . Type II or unspecified type diabetes mellitus without mention of complication, uncontrolled 02/18/2013  . Chest pain at rest 10/18/2011    Class: Acute  . Shortness of breath 10/18/2011    Class: Acute    HARRIS,KAREN 12/31/2015, 4:25 PM  [facility] [address] [place], [place], [address] Phone: [phone]   Fax:  [phone]  Name: Brandy Houston MRN: [id number] Date of Birth: Jan 05, 1956    Melvenia Needles, PTA 12/31/2015 4:25 PM Phone: [phone] Fax: [phone]

## 2016-01-01 DIAGNOSIS — I129 Hypertensive chronic kidney disease with stage 1 through stage 4 chronic kidney disease, or unspecified chronic kidney disease: Secondary | ICD-10-CM | POA: Diagnosis not present

## 2016-01-01 DIAGNOSIS — E1129 Type 2 diabetes mellitus with other diabetic kidney complication: Secondary | ICD-10-CM | POA: Diagnosis not present

## 2016-01-01 DIAGNOSIS — Z720 Tobacco use: Secondary | ICD-10-CM | POA: Diagnosis not present

## 2016-01-01 DIAGNOSIS — N2581 Secondary hyperparathyroidism of renal origin: Secondary | ICD-10-CM | POA: Diagnosis not present

## 2016-01-01 DIAGNOSIS — N184 Chronic kidney disease, stage 4 (severe): Secondary | ICD-10-CM | POA: Diagnosis not present

## 2016-01-01 DIAGNOSIS — D631 Anemia in chronic kidney disease: Secondary | ICD-10-CM | POA: Diagnosis not present

## 2016-01-01 DIAGNOSIS — Z96652 Presence of left artificial knee joint: Secondary | ICD-10-CM | POA: Diagnosis not present

## 2016-01-02 ENCOUNTER — Ambulatory Visit: Payer: Medicare Other | Admitting: Physical Therapy

## 2016-01-02 ENCOUNTER — Other Ambulatory Visit: Payer: Self-pay | Admitting: Adult Health

## 2016-01-02 DIAGNOSIS — R609 Edema, unspecified: Secondary | ICD-10-CM | POA: Diagnosis not present

## 2016-01-02 DIAGNOSIS — M25612 Stiffness of left shoulder, not elsewhere classified: Secondary | ICD-10-CM | POA: Diagnosis not present

## 2016-01-02 DIAGNOSIS — M25562 Pain in left knee: Secondary | ICD-10-CM

## 2016-01-02 DIAGNOSIS — R600 Localized edema: Secondary | ICD-10-CM

## 2016-01-02 DIAGNOSIS — R262 Difficulty in walking, not elsewhere classified: Secondary | ICD-10-CM | POA: Diagnosis not present

## 2016-01-02 NOTE — Therapy (Signed)
[place], [place], [address] Phone: [phone]   Fax:  [phone]  Physical Therapy Treatment  Patient Details  Name: Brandy Houston MRN: [id number] Date of Birth: 04-17-1956 Referring Provider: Dr. [name]   Encounter Date: 01/02/2016      PT End of Session - 01/02/16 1545    Visit Number 4   Number of Visits 16   Date for PT Re-Evaluation 02/06/16   PT Start Time 1502   PT Stop Time 1546   PT Time Calculation (min) 44 min   Activity Tolerance Patient tolerated treatment well   Behavior During Therapy [facility] for tasks assessed/performed      Past Medical History  Diagnosis Date  . Hypertension   . Peripheral neuropathy ([place])   . Hepatitis C 2016  . Diabetes mellitus     type 2, IDDM  . Depression     Chronic  . Anxiety   . GERD (gastroesophageal reflux disease)   . Septic arthritis ([place])   . Dysrhythmia     tachycardia, normal ECHO 08-09-14  . Anemia   . Chronic kidney disease     stage IV. previous HD, none currently 10/30/15  . Unsteady gait   . Osteoarthritis of left knee   . Protein calorie malnutrition ([place])   . Chronic diastolic CHF (congestive heart failure) ([place])   . Diabetic peripheral neuropathy ([place])   . Slow transit constipation     Past Surgical History  Procedure Laterality Date  . Cholecystectomy    . Appendectomy    . Small intestine surgery      Due to Small Bowel Obstruction  . Tubal ligation    . Knee arthroscopy Right 08/10/2014    Procedure: ARTHROSCOPY I & D KNEE;  Surgeon: Marianna Payment, MD;  [facility];  Service: Orthopedics;  Laterality: Right;  . Shoulder arthroscopy Bilateral 08/10/2014    Procedure: I & D BILATERAL SHOULDERS ;  Surgeon: Marianna Payment, MD;  [facility];  Service: Orthopedics;  Laterality: Bilateral;  . Tenosynovectomy Right 08/11/2014    Procedure: RIGHT WRIST IRRIGATION AND DEBRIDEMENT, TENOSYNOVECTOMY;  Surgeon: Marianna Payment, MD;  Location: [place];  Service: Orthopedics;  Laterality: Right;  . Knee arthroscopy Left 08/11/2014    Procedure: ARTHROSCOPIC WASHOUT LEFT KNEE;  Surgeon: Marianna Payment, MD;  Location: [place];  Service: Orthopedics;  Laterality: Left;  . Tee without cardioversion N/A 08/14/2014    Procedure: TRANSESOPHAGEAL ECHOCARDIOGRAM (TEE);  Surgeon: Thayer Headings, MD;  Location: [place];  Service: Cardiovascular;  Laterality: N/A;  . Knee arthroscopy Left 08/19/2014    Procedure: ARTHROSCOPIC WASHOUT LEFT KNEE;  Surgeon: Leandrew Koyanagi, MD;  Location: [place];  Service: Orthopedics;  Laterality: Left;  . Av fistula placement Left 09/13/2014    Procedure: Brachial Artery to Brachial Vein Gortex Four - Seven Stretch GRAFT INSERTION Left Forearm;  Surgeon: Mal Misty, MD;  Location: [place];  Service: Vascular;  Laterality: Left;  . Knee arthroscopy with medial menisectomy Left 04/04/2015    Procedure: LEFT KNEE ARTHROSCOPY WITH PARTIAL MEDIAL MENISCECTOMY  AND SYNOVECTOMY;  Surgeon: Leandrew Koyanagi, MD;  Location: [place];  Service: Orthopedics;  Laterality: Left;  . Knee arthroscopy with lateral menisectomy Left 04/04/2015    Procedure: AND PARTIAL LATERAL MENISECTOMY;  Surgeon: Leandrew Koyanagi, MD;  Location: [place];  Service: Orthopedics;  Laterality: Left;  . Total knee arthroplasty Left 11/07/2015  Procedure: LEFT TOTAL KNEE ARTHROPLASTY WITH REVISION OF IMPLANTS;  Surgeon: Leandrew Koyanagi, MD;  Location: [place];  Service: Orthopedics;  Laterality: Left;    There were no vitals filed for this visit.      Subjective Assessment - 01/02/16 1506    Subjective 4/10 now.  My knee flips about 2 times a day.  Yesterday i almost hit the floor. I did not walk with my walker. I am having less sharp pains in my leg. I am still exerciing.  It hurts to bend .   Pain is getting better overall.    Currently in Pain? Yes   Pain Score 4    Pain Location Knee   Pain  Orientation Left   Pain Descriptors / Indicators Tightness;Sore   Pain Frequency Intermittent   Aggravating Factors  bending   Pain Relieving Factors meds, ice, massage, rest.                         [facility] Adult PT Treatment/Exercise - 01/02/16 1509    Ambulation/Gait   Ambulation/Gait Yes   Gait Comments 60 feet, single point cane,  CGA, tactile cues rotation.  shakey, not ready to do at home   Knee/Hip Exercises: Aerobic   Nustep L5 6 minutes.   Knee/Hip Exercises: Seated   Stool Scoot - Round Trips 30 feet total  her walker seat used.   Hamstring Curl 10 reps;2 sets  red band   Sit to Sand 10 reps  some use of hands   Knee/Hip Exercises: Supine   Short Arc Quad Sets 3 sets  3,5,8 pounds   Heel Slides 10 reps   Straight Leg Raises 5 reps;2 sets  fatigued,  some quad lag today   Knee/Hip Exercises: Prone   Hamstring Curl 10 reps  5 reps with gentle overpressure.   Manual Therapy   Manual Therapy --  gentle retrograde and ROM stretches.     Edema Management tissue softens thigh, knee tissue remains stiff.                 PT Education - 01/02/16 1745    Education provided Yes   Education Details gait training,c cane   Person(s) Educated Patient   Methods Explanation;Demonstration   Comprehension Verbalized understanding;Returned demonstration;Need further instruction          PT Short Term Goals - 12/31/15 1622    PT SHORT TERM GOAL #1   Title Pt will be able to be I with HEP for knee AROM/AAROM    Time 4   Period Weeks   Status On-going   PT SHORT TERM GOAL #2   Title Pt will be able to bend knee in sitting to 95 deg for improved transfers   Baseline able (&/17/2017)   Time 4   Period Weeks   Status Achieved   PT SHORT TERM GOAL #3   Title Pt will be able to understand RICE, edema mgmt    Time 4   Period Weeks   Status Unable to assess           PT Long Term Goals - 12/12/15 1610    PT LONG TERM GOAL #1   Title Pt will  be able to be I with more advanced HEP    Time 8   Period Weeks   Status New   PT LONG TERM GOAL #2   Title Pt will score FOTO less than 48% limited  Time 8   Period Weeks   Status New   PT LONG TERM GOAL #3   Title Pt will be able to walk with LRAD in community as needed with min increase in knee pain.    Time 8   Period Weeks   Status New   PT LONG TERM GOAL #4   Title Pt will demo knee AROM flexion to 110 deg for improved transfers.    Time 8   Period Weeks   Status New   PT LONG TERM GOAL #5   Title Pt will demo 4+/5 in L knee for improved stability with gait, function with stairs.    Time 8   Period Weeks   Status New   Additional Long Term Goals   Additional Long Term Goals Yes   PT LONG TERM GOAL #6   Title Balance goal to be assessed.    Time 8   Period Weeks   Status New               Plan - 01/02/16 1546    Clinical Impression Statement Less popping with gait when she keeps her knee slightly flexed vs hyperextended with weightbearing.  [id number],  AAROM not measured.  Trial cane today.   PT Next Visit Plan strengthening, stretching and gait,  Eccentric quads,  calf stretch   PT Home Exercise Plan continue   Consulted and Agree with Plan of Care Patient      Patient will benefit from skilled therapeutic intervention in order to improve the following deficits and impairments:  Abnormal gait, Decreased activity tolerance, Decreased balance, Decreased mobility, Decreased strength, Increased edema, Impaired sensation, Postural dysfunction, Impaired flexibility, Hypomobility, Decreased scar mobility, Decreased skin integrity, Pain, Decreased range of motion, Difficulty walking, Increased fascial restricitons  Visit Diagnosis: Edema, unspecified  Localized edema  Pain in left knee     Problem List Patient Active Problem List   Diagnosis Date Noted  . Chronic diastolic CHF (congestive heart failure) ([place]) 11/08/2015  . Uncontrolled type 2 diabetes  mellitus with complication ([place])   . Status post total left knee replacement   . Essential hypertension   . Acute blood loss anemia   . Diabetic peripheral neuropathy ([place])   . Anxiety state   . Depression   . Knee joint replacement status 11/07/2015  . Total knee replacement status 11/07/2015  . Normocytic anemia 11/07/2015  . Diabetes mellitus with complication ([place])   . Chronic kidney disease (CKD), stage IV (severe) ([place])   . Postoperative surgical complication involving circulatory system   . Physical debility 09/14/2014  . Acute renal failure syndrome ([place])   . Hep C w/ coma, chronic ([place])   . Joint infection ([place])   . Acute renal failure ([place])   . Hepatitis C 08/16/2014  . SIRS (systemic inflammatory response syndrome) ([name])   . Benign essential HTN   . Septic joint of right hand ([place])   . Staphylococcus aureus bacteremia   . Septic joint of right knee joint ([place])   . ESRD (end stage renal disease) on dialysis ([place])   . Tachycardia 08/07/2014  . Septic joint of left shoulder region ([place]) 08/07/2014  . Sinus tachycardia ([place]) 08/07/2014  . BP (high blood pressure) 06/24/2013  . Disorder of peripheral nervous system ([place]) 06/24/2013  . 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 Diabetes mellitus, type 2 ([place]) 06/24/2013  . Uncontrolled hypertension 02/18/2013  . Type II or unspecified type diabetes mellitus without mention of complication, uncontrolled  02/18/2013  . Chest pain at rest 10/18/2011    Class: Acute  . Shortness of breath 10/18/2011    Class: Acute    HARRIS,KAREN 01/02/2016, 5:47 PM  [facility] [address] [place], [place], [address] Phone: [phone]   Fax:  [phone]  Name: AJA BENSEL MRN: [id number] Date of Birth: 1956/01/27    Melvenia Needles, PTA 01/02/2016 5:47 PM Phone: [phone] Fax: [phone]

## 2016-01-07 ENCOUNTER — Ambulatory Visit: Payer: Medicare Other | Admitting: Physical Therapy

## 2016-01-07 DIAGNOSIS — R262 Difficulty in walking, not elsewhere classified: Secondary | ICD-10-CM

## 2016-01-07 DIAGNOSIS — R609 Edema, unspecified: Secondary | ICD-10-CM | POA: Diagnosis not present

## 2016-01-07 DIAGNOSIS — M25562 Pain in left knee: Secondary | ICD-10-CM | POA: Diagnosis not present

## 2016-01-07 DIAGNOSIS — R600 Localized edema: Secondary | ICD-10-CM

## 2016-01-07 DIAGNOSIS — M25612 Stiffness of left shoulder, not elsewhere classified: Secondary | ICD-10-CM | POA: Diagnosis not present

## 2016-01-07 NOTE — Therapy (Signed)
[place], [place], [address] Phone: [phone]   Fax:  [phone]  Physical Therapy Treatment  Patient Details  Name: Brandy Houston MRN: [id number] Date of Birth: [birth year] Referring Provider: Dr. [name]   Encounter Date: 01/07/2016      PT End of Session - 01/07/16 1439    Visit Number 5   Number of Visits 16   Date for PT Re-Evaluation 02/06/16   PT Start Time 1440   PT Stop Time 1538   PT Time Calculation (min) 58 min   Activity Tolerance Patient tolerated treatment well   Behavior During Therapy [facility] for tasks assessed/performed      Past Medical History:  Diagnosis Date  . Anemia   . Anxiety   . Chronic diastolic CHF (congestive heart failure) ([place])   . Chronic kidney disease    stage IV. previous HD, none currently 10/30/15  . Depression    Chronic  . Diabetes mellitus    type 2, IDDM  . Diabetic peripheral neuropathy ([place])   . Dysrhythmia    tachycardia, normal ECHO 08-09-14  . GERD (gastroesophageal reflux disease)   . Hepatitis C 2016  . Hypertension   . Osteoarthritis of left knee   . Peripheral neuropathy ([place])   . Protein calorie malnutrition ([place])   . Septic arthritis ([place])   . Slow transit constipation   . Unsteady gait     Past Surgical History:  Procedure Laterality Date  . APPENDECTOMY    . AV FISTULA PLACEMENT Left 09/13/2014   Procedure: Brachial Artery to Brachial Vein Gortex Four - Seven Stretch GRAFT INSERTION Left Forearm;  Surgeon: Mal Misty, MD;  Location: [place];  Service: Vascular;  Laterality: Left;  . CHOLECYSTECTOMY    . KNEE ARTHROSCOPY Right 08/10/2014   Procedure: ARTHROSCOPY I & D KNEE;  Surgeon: Marianna Payment, MD;  [facility];  Service: Orthopedics;  Laterality: Right;  . KNEE ARTHROSCOPY Left 08/11/2014   Procedure: ARTHROSCOPIC WASHOUT LEFT KNEE;  Surgeon: Marianna Payment, MD;  Location: [place];  Service: Orthopedics;  Laterality: Left;   . KNEE ARTHROSCOPY Left 08/19/2014   Procedure: ARTHROSCOPIC WASHOUT LEFT KNEE;  Surgeon: Leandrew Koyanagi, MD;  Location: [place];  Service: Orthopedics;  Laterality: Left;  . KNEE ARTHROSCOPY WITH LATERAL MENISECTOMY Left 04/04/2015   Procedure: AND PARTIAL LATERAL MENISECTOMY;  Surgeon: Leandrew Koyanagi, MD;  Location: [place];  Service: Orthopedics;  Laterality: Left;  . KNEE ARTHROSCOPY WITH MEDIAL MENISECTOMY Left 04/04/2015   Procedure: LEFT KNEE ARTHROSCOPY WITH PARTIAL MEDIAL MENISCECTOMY  AND SYNOVECTOMY;  Surgeon: Leandrew Koyanagi, MD;  Location: [place];  Service: Orthopedics;  Laterality: Left;  . SHOULDER ARTHROSCOPY Bilateral 08/10/2014   Procedure: I & D BILATERAL SHOULDERS ;  Surgeon: Marianna Payment, MD;  [facility];  Service: Orthopedics;  Laterality: Bilateral;  . SMALL INTESTINE SURGERY     Due to Small Bowel Obstruction  . TEE WITHOUT CARDIOVERSION N/A 08/14/2014   Procedure: TRANSESOPHAGEAL ECHOCARDIOGRAM (TEE);  Surgeon: Thayer Headings, MD;  Location: [place];  Service: Cardiovascular;  Laterality: N/A;  . TENOSYNOVECTOMY Right 08/11/2014   Procedure: RIGHT WRIST IRRIGATION AND DEBRIDEMENT, TENOSYNOVECTOMY;  Surgeon: Marianna Payment, MD;  Location: [place];  Service: Orthopedics;  Laterality: Right;  . TOTAL KNEE ARTHROPLASTY Left 11/07/2015   Procedure: LEFT TOTAL KNEE ARTHROPLASTY WITH REVISION OF IMPLANTS;  Surgeon: Leandrew Koyanagi, MD;  [facility];  Service: Orthopedics;  Laterality: Left;  . TUBAL LIGATION      There were no vitals filed for this visit.      Subjective Assessment - 01/07/16 1445    Subjective 5/10 with riding recumbant bike.  Knee still buckles 2-3 times per day.  Therapy is helping her, feels she has more motivated, feels shes doing more. Walked alot at reunion on Saturday, went to [place].     Currently in Pain? Yes   Pain Score 5    Pain Location Knee   Pain Orientation Left   Pain Descriptors /  Indicators Sore   Pain Type Chronic pain;Surgical pain   Pain Onset More than a month ago   Pain Frequency Intermittent   Aggravating Factors  bending walking    Pain Relieving Factors meds, ice, massage, rest             [facility] PT Assessment - 01/07/16 1447      Circumferential Edema   Circumferential - Left  17.75 inch      AROM   Right Knee Extension 0   Right Knee Flexion 128   Left Knee Extension 0   Left Knee Flexion 105     Strength   Right Hip Flexion 3+/5   Left Hip Flexion 3+/5   Left Hip ABduction 4/5   Left Knee Flexion 4/5   Left Knee Extension 3+/5                     [facility] Adult PT Treatment/Exercise - 01/07/16 1501      Knee/Hip Exercises: Stretches   Active Hamstring Stretch 2 reps;30 seconds   Knee: Self-Stretch to increase Flexion Left;3 reps;30 seconds   Gastroc Stretch 2 reps;30 seconds   Gastroc Stretch Limitations standing and sitting with rolled towel under knee. (HEP)     Knee/Hip Exercises: Aerobic   Recumbent Bike 5 min level 2 full revolution      Knee/Hip Exercises: Standing   Heel Raises Both;2 sets;10 reps   Heel Raises Limitations used ball between ankles to improve alignment    Terminal Knee Extension 2 sets;10 reps  pressing yellow ball into wall.   Lateral Step Up Left;1 set;10 reps;Hand Hold: 2;Step Height: 4"   Lateral Step Up Limitations cues to control knee eccentrically and avoid hyperext    Forward Step Up Left;2 sets;10 reps;Hand Hold: 2;Step Height: 4"   Wall Squat 2 sets;10 reps     Knee/Hip Exercises: Supine   Quad Sets Strengthening;Left;1 set;10 reps   Bridges with [employer] Strengthening;Both;1 set;10 reps   Straight Leg Raises Strengthening;Left;1 set;10 reps     Knee/Hip Exercises: Sidelying   Hip ABduction Strengthening;Left;1 set;10 reps   Clams x 20    Other Sidelying Knee/Hip Exercises Pt very wobbly in sidelying , cramps in calf                   PT Short Term Goals -  01/07/16 1535      PT SHORT TERM GOAL #1   Title Pt will be able to be I with HEP for knee AROM/AAROM    Status Partially Met     PT SHORT TERM GOAL #2   Title Pt will be able to bend knee in sitting to 95 deg for improved transfers   Status Achieved     PT SHORT TERM GOAL #3   Title Pt will be able to understand RICE, edema mgmt    Status  Achieved           PT Long Term Goals - 01/07/16 1535      PT LONG TERM GOAL #1   Title Pt will be able to be I with more advanced HEP    Status On-going     PT LONG TERM GOAL #2   Title Pt will score FOTO less than 48% limited   Status Unable to assess     PT LONG TERM GOAL #3   Title Pt will be able to walk with LRAD in community as needed with min increase in knee pain.    Status On-going     PT LONG TERM GOAL #4   Title Pt will demo knee AROM flexion to 110 deg for improved transfers.    Status Partially Met     PT LONG TERM GOAL #5   Title Pt will demo 4+/5 in L knee for improved stability with gait, function with stairs.    Status On-going     PT LONG TERM GOAL #6   Title Balance goal to be assessed.    Status Unable to assess               Plan - 01/07/16 1533    Clinical Impression Statement genu recurvatum may be natural to her but quad is inhibited as well (Rt. Leg hyperext as well).  Hamstring stretch in supine was very painful but knee was hyperext 8 deg.  Worked on knee control at small step, noticed knee buckle when fatigued.  Swelling improved a great deal.    PT Next Visit Plan strengthening, stretching and gait,  Eccentric quads,  calf stretch   PT Home Exercise Plan continue   Consulted and Agree with Plan of Care Patient      Patient will benefit from skilled therapeutic intervention in order to improve the following deficits and impairments:  Abnormal gait, Decreased activity tolerance, Decreased balance, Decreased mobility, Decreased strength, Increased edema, Impaired sensation, Postural  dysfunction, Impaired flexibility, Hypomobility, Decreased scar mobility, Decreased skin integrity, Pain, Decreased range of motion, Difficulty walking, Increased fascial restricitons  Visit Diagnosis: Localized edema  Pain in left knee  Difficulty in walking, not elsewhere classified     Problem List Patient Active Problem List   Diagnosis Date Noted  . Chronic diastolic CHF (congestive heart failure) ([place]) 11/08/2015  . Uncontrolled type 2 diabetes mellitus with complication ([place])   . Status post total left knee replacement   . Essential hypertension   . Acute blood loss anemia   . Diabetic peripheral neuropathy ([place])   . Anxiety state   . Depression   . Knee joint replacement status 11/07/2015  . Total knee replacement status 11/07/2015  . Normocytic anemia 11/07/2015  . Diabetes mellitus with complication ([place])   . Chronic kidney disease (CKD), stage IV (severe) ([place])   . Postoperative surgical complication involving circulatory system   . Physical debility 09/14/2014  . Acute renal failure syndrome ([place])   . Hep C w/ coma, chronic ([place])   . Joint infection ([place])   . Acute renal failure ([place])   . Hepatitis C 08/16/2014  . SIRS (systemic inflammatory response syndrome) ([name])   . Benign essential HTN   . Septic joint of right hand ([place])   . Staphylococcus aureus bacteremia   . Septic joint of right knee joint ([place])   . ESRD (end stage renal disease) on dialysis ([place])   . Tachycardia 08/07/2014  . Septic joint of left  shoulder region ([place]) 08/07/2014  . Sinus tachycardia ([place]) 08/07/2014  . BP (high blood pressure) 06/24/2013  . Disorder of peripheral nervous system ([place]) 06/24/2013  . 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 Diabetes mellitus, type 2 ([place]) 06/24/2013  . Uncontrolled hypertension 02/18/2013  . Type II or unspecified type diabetes mellitus without mention of complication, uncontrolled 02/18/2013  . Chest pain at rest 10/18/2011    Class: Acute   . Shortness of breath 10/18/2011    Class: Acute    Taiten Brawn 01/07/2016, 3:40 PM  [facility] [address] [place], [place], [address] Phone: [phone]   Fax:  [phone]  Name: CANDIES PALM MRN: [id number] Date of Birth: 02/20/1956   Raeford Razor, PT 01/07/16 3:40 PM Phone: [phone] Fax: [phone]

## 2016-01-08 ENCOUNTER — Encounter (HOSPITAL_COMMUNITY)
Admission: RE | Admit: 2016-01-08 | Discharge: 2016-01-08 | Disposition: A | Discharge status: Home / Self Care | Payer: Medicare Other | Source: Ambulatory Visit | Attending: Nephrology | Admitting: Nephrology

## 2016-01-08 DIAGNOSIS — N184 Chronic kidney disease, stage 4 (severe): Secondary | ICD-10-CM

## 2016-01-08 DIAGNOSIS — D638 Anemia in other chronic diseases classified elsewhere: Secondary | ICD-10-CM | POA: Insufficient documentation

## 2016-01-08 LAB — IRON AND TIBC
Iron: 30 ug/dL (ref 28–170)
Saturation Ratios: 12 % (ref 10.4–31.8)
TIBC: 242 ug/dL — ABNORMAL LOW (ref 250–450)
UIBC: 212 ug/dL

## 2016-01-08 LAB — POCT HEMOGLOBIN-HEMACUE: Hemoglobin: 11 g/dL — ABNORMAL LOW (ref 12.0–15.0)

## 2016-01-08 LAB — FERRITIN: Ferritin: 560 ng/mL — ABNORMAL HIGH (ref 11–307)

## 2016-01-08 MED ORDER — DARBEPOETIN ALFA 200 MCG/0.4ML IJ SOSY
200.0000 ug | PREFILLED_SYRINGE | INTRAMUSCULAR | Status: DC
  Administered 2016-01-08: 200 ug via SUBCUTANEOUS

## 2016-01-08 MED ORDER — DARBEPOETIN ALFA 200 MCG/0.4ML IJ SOSY
PREFILLED_SYRINGE | INTRAMUSCULAR | Status: AC
  Filled 2016-01-08: qty 0.4

## 2016-01-09 ENCOUNTER — Ambulatory Visit: Payer: Medicare Other | Admitting: Physical Therapy

## 2016-01-09 DIAGNOSIS — R262 Difficulty in walking, not elsewhere classified: Secondary | ICD-10-CM | POA: Diagnosis not present

## 2016-01-09 DIAGNOSIS — M25612 Stiffness of left shoulder, not elsewhere classified: Secondary | ICD-10-CM | POA: Diagnosis not present

## 2016-01-09 DIAGNOSIS — M25562 Pain in left knee: Secondary | ICD-10-CM

## 2016-01-09 DIAGNOSIS — R600 Localized edema: Secondary | ICD-10-CM | POA: Diagnosis not present

## 2016-01-09 DIAGNOSIS — R609 Edema, unspecified: Secondary | ICD-10-CM | POA: Diagnosis not present

## 2016-01-09 NOTE — Therapy (Signed)
[place], [place], [address] Phone: [phone]   Fax:  [phone]  Physical Therapy Treatment  Patient Details  Name: Brandy Houston MRN: [id number] Date of Birth: 04-Nov-1955 Referring Provider: Dr. [name]   Encounter Date: 01/09/2016      PT End of Session - 01/09/16 1420    Visit Number 6   Number of Visits 16   Date for PT Re-Evaluation 02/06/16   PT Start Time 1630   PT Stop Time 1730   PT Time Calculation (min) 60 min   Activity Tolerance Patient tolerated treatment well   Behavior During Therapy [facility] for tasks assessed/performed      Past Medical History:  Diagnosis Date  . Anemia   . Anxiety   . Chronic diastolic CHF (congestive heart failure) ([place])   . Chronic kidney disease    stage IV. previous HD, none currently 10/30/15  . Depression    Chronic  . Diabetes mellitus    type 2, IDDM  . Diabetic peripheral neuropathy ([place])   . Dysrhythmia    tachycardia, normal ECHO 08-09-14  . GERD (gastroesophageal reflux disease)   . Hepatitis C 2016  . Hypertension   . Osteoarthritis of left knee   . Peripheral neuropathy ([place])   . Protein calorie malnutrition ([name])   . Septic arthritis ([place])   . Slow transit constipation   . Unsteady gait     Past Surgical History:  Procedure Laterality Date  . APPENDECTOMY    . AV FISTULA PLACEMENT Left 09/13/2014   Procedure: Brachial Artery to Brachial Vein Gortex Four - Seven Stretch GRAFT INSERTION Left Forearm;  Surgeon: Mal Misty, MD;  Location: [place];  Service: Vascular;  Laterality: Left;  . CHOLECYSTECTOMY    . KNEE ARTHROSCOPY Right 08/10/2014   Procedure: ARTHROSCOPY I & D KNEE;  Surgeon: Marianna Payment, MD;  [facility];  Service: Orthopedics;  Laterality: Right;  . KNEE ARTHROSCOPY Left 08/11/2014   Procedure: ARTHROSCOPIC WASHOUT LEFT KNEE;  Surgeon: Marianna Payment, MD;  Location: [place];  Service: Orthopedics;  Laterality: Left;   . KNEE ARTHROSCOPY Left 08/19/2014   Procedure: ARTHROSCOPIC WASHOUT LEFT KNEE;  Surgeon: Leandrew Koyanagi, MD;  Location: [place];  Service: Orthopedics;  Laterality: Left;  . KNEE ARTHROSCOPY WITH LATERAL MENISECTOMY Left 04/04/2015   Procedure: AND PARTIAL LATERAL MENISECTOMY;  Surgeon: Leandrew Koyanagi, MD;  Location: [place];  Service: Orthopedics;  Laterality: Left;  . KNEE ARTHROSCOPY WITH MEDIAL MENISECTOMY Left 04/04/2015   Procedure: LEFT KNEE ARTHROSCOPY WITH PARTIAL MEDIAL MENISCECTOMY  AND SYNOVECTOMY;  Surgeon: Leandrew Koyanagi, MD;  Location: [place];  Service: Orthopedics;  Laterality: Left;  . SHOULDER ARTHROSCOPY Bilateral 08/10/2014   Procedure: I & D BILATERAL SHOULDERS ;  Surgeon: Marianna Payment, MD;  [facility];  Service: Orthopedics;  Laterality: Bilateral;  . SMALL INTESTINE SURGERY     Due to Small Bowel Obstruction  . TEE WITHOUT CARDIOVERSION N/A 08/14/2014   Procedure: TRANSESOPHAGEAL ECHOCARDIOGRAM (TEE);  Surgeon: Thayer Headings, MD;  Location: [place];  Service: Cardiovascular;  Laterality: N/A;  . TENOSYNOVECTOMY Right 08/11/2014   Procedure: RIGHT WRIST IRRIGATION AND DEBRIDEMENT, TENOSYNOVECTOMY;  Surgeon: Marianna Payment, MD;  Location: [place];  Service: Orthopedics;  Laterality: Right;  . TOTAL KNEE ARTHROPLASTY Left 11/07/2015   Procedure: LEFT TOTAL KNEE ARTHROPLASTY WITH REVISION OF IMPLANTS;  Surgeon: Leandrew Koyanagi, MD;  Location: [facility];  Service: Orthopedics;  Laterality: Left;  . TUBAL LIGATION      There were no vitals filed for this visit.      Subjective Assessment - 01/09/16 1335    Subjective 3/10.  I haven't felt this good for a long time.    Currently in Pain? Yes   Pain Score 3    Pain Location Knee   Pain Orientation Left   Pain Descriptors / Indicators Sore                         [facility] Adult PT Treatment/Exercise - 01/09/16 0001      Ambulation/Gait   Ambulation/Gait Yes    Stairs Yes   Stairs Assistance Other (comment)   Height of Stairs 4   Gait Comments descending requires extra time,  moderate use of hands and cues.  ascending a little less difficult.  4 reps of short steps     Knee/Hip Exercises: Stretches   Gastroc Stretch 2 reps;10 seconds  manual stretch     Knee/Hip Exercises: Aerobic   Nustep L5 6 minutes     Knee/Hip Exercises: Standing   Terminal Knee Extension 1 set;10 reps  pressing yellow ball     Knee/Hip Exercises: Seated   Long Arc Quad 5 reps  10 sceond hold and slow lower   Long Arc Quad Weight 4 lbs.   Sit to Sand --  pause , hold with descending     Knee/Hip Exercises: Supine   Quad Sets Limitations 4,7 pounds 10 x each   Heel Slides 10 reps  AA end range     Cryotherapy   Cryotherapy Location Knee   Type of Cryotherapy --  cold pack, leg elevated     Manual Therapy   Manual therapy comments edema continues,  retrograde thigh.  lateral hamstrings are feeling better.                    PT Short Term Goals - 01/07/16 1535      PT SHORT TERM GOAL #1   Title Pt will be able to be I with HEP for knee AROM/AAROM    Status Partially Met     PT SHORT TERM GOAL #2   Title Pt will be able to bend knee in sitting to 95 deg for improved transfers   Status Achieved     PT SHORT TERM GOAL #3   Title Pt will be able to understand RICE, edema mgmt    Status Achieved           PT Long Term Goals - 01/07/16 1535      PT LONG TERM GOAL #1   Title Pt will be able to be I with more advanced HEP    Status On-going     PT LONG TERM GOAL #2   Title Pt will score FOTO less than 48% limited   Status Unable to assess     PT LONG TERM GOAL #3   Title Pt will be able to walk with LRAD in community as needed with min increase in knee pain.    Status On-going     PT LONG TERM GOAL #4   Title Pt will demo knee AROM flexion to 110 deg for improved transfers.    Status Partially Met     PT LONG TERM GOAL #5    Title Pt will demo 4+/5 in L knee for improved stability with gait,  function with stairs.    Status On-going     PT LONG TERM GOAL #6   Title Balance goal to be assessed.    Status Unable to assess               Plan - 01/09/16 1421    Clinical Impression Statement 4/10 pain post session.  Hamstrings less sore today.  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  Encouraged Patient that this pain is expected and not a sign of problems.    PT Next Visit Plan continue eccentric, gait training on stairs.  stretching, manual as needed.    PT Home Exercise Plan continue.  use quads with walking   Consulted and Agree with Plan of Care Patient      Patient will benefit from skilled therapeutic intervention in order to improve the following deficits and impairments:  Abnormal gait, Decreased activity tolerance, Decreased balance, Decreased mobility, Decreased strength, Increased edema, Impaired sensation, Postural dysfunction, Impaired flexibility, Hypomobility, Decreased scar mobility, Decreased skin integrity, Pain, Decreased range of motion, Difficulty walking, Increased fascial restricitons  Visit Diagnosis: Localized edema  Pain in left knee  Difficulty in walking, not elsewhere classified  Edema, unspecified     Problem List Patient Active Problem List   Diagnosis Date Noted  . Chronic diastolic CHF (congestive heart failure) ([place]) 11/08/2015  . Uncontrolled type 2 diabetes mellitus with complication ([place])   . Status post total left knee replacement   . Essential hypertension   . Acute blood loss anemia   . Diabetic peripheral neuropathy ([place])   . Anxiety state   . Depression   . Knee joint replacement status 11/07/2015  . Total knee replacement status 11/07/2015  . Normocytic anemia 11/07/2015  . Diabetes mellitus with complication ([place])   . Chronic kidney disease (CKD), stage IV (severe) ([place])   . Postoperative surgical complication involving circulatory  system   . Physical debility 09/14/2014  . Acute renal failure syndrome ([place])   . Hep C w/ coma, chronic ([place])   . Joint infection ([place])   . Acute renal failure ([place])   . Hepatitis C 08/16/2014  . SIRS (systemic inflammatory response syndrome) ([name])   . Benign essential HTN   . Septic joint of right hand ([place])   . Staphylococcus aureus bacteremia   . Septic joint of right knee joint ([place])   . ESRD (end stage renal disease) on dialysis ([place])   . Tachycardia 08/07/2014  . Septic joint of left shoulder region ([place]) 08/07/2014  . Sinus tachycardia ([place]) 08/07/2014  . BP (high blood pressure) 06/24/2013  . Disorder of peripheral nervous system ([place]) 06/24/2013  . 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 Diabetes mellitus, type 2 ([place]) 06/24/2013  . Uncontrolled hypertension 02/18/2013  . Type II or unspecified type diabetes mellitus without mention of complication, uncontrolled 02/18/2013  . Chest pain at rest 10/18/2011    Class: Acute  . Shortness of breath 10/18/2011    Class: Acute    Brandy Houston 01/09/2016, 2:26 PM  [facility] [address] [place], [place], [address] Phone: [phone]   Fax:  [phone]  Name: Brandy Houston MRN: [id number] Date of Birth: 1956/03/14   Melvenia Needles, PTA 01/09/16 2:26 PM Phone: [phone] Fax: [phone]

## 2016-01-16 ENCOUNTER — Ambulatory Visit: Payer: Medicare Other | Attending: Orthopaedic Surgery | Admitting: Physical Therapy

## 2016-01-16 DIAGNOSIS — R600 Localized edema: Secondary | ICD-10-CM | POA: Diagnosis not present

## 2016-01-16 DIAGNOSIS — M6281 Muscle weakness (generalized): Secondary | ICD-10-CM | POA: Diagnosis not present

## 2016-01-16 DIAGNOSIS — R262 Difficulty in walking, not elsewhere classified: Secondary | ICD-10-CM

## 2016-01-16 DIAGNOSIS — M25672 Stiffness of left ankle, not elsewhere classified: Secondary | ICD-10-CM | POA: Insufficient documentation

## 2016-01-16 DIAGNOSIS — M25562 Pain in left knee: Secondary | ICD-10-CM | POA: Diagnosis not present

## 2016-01-16 DIAGNOSIS — R609 Edema, unspecified: Secondary | ICD-10-CM | POA: Insufficient documentation

## 2016-01-16 NOTE — Therapy (Signed)
[place], [place], [address] Phone: [phone]   Fax:  [phone]  Physical Therapy Treatment  Patient Details  Name: Brandy Houston MRN: [id number] Date of Birth: 10-28-1955 Referring Provider: Dr. [name]   Encounter Date: 01/16/2016      PT End of Session - 01/16/16 1344    Visit Number 8   Number of Visits 16   Date for PT Re-Evaluation 02/06/16   PT Start Time 0130   PT Stop Time 0215   PT Time Calculation (min) 45 min      Past Medical History:  Diagnosis Date  . Anemia   . Anxiety   . Chronic diastolic CHF (congestive heart failure) ([place])   . Chronic kidney disease    stage IV. previous HD, none currently 10/30/15  . Depression    Chronic  . Diabetes mellitus    type 2, IDDM  . Diabetic peripheral neuropathy ([place])   . Dysrhythmia    tachycardia, normal ECHO 08-09-14  . GERD (gastroesophageal reflux disease)   . Hepatitis C 2016  . Hypertension   . Osteoarthritis of left knee   . Peripheral neuropathy ([place])   . Protein calorie malnutrition ([place])   . Septic arthritis ([place])   . Slow transit constipation   . Unsteady gait     Past Surgical History:  Procedure Laterality Date  . APPENDECTOMY    . AV FISTULA PLACEMENT Left 09/13/2014   Procedure: Brachial Artery to Brachial Vein Gortex Four - Seven Stretch GRAFT INSERTION Left Forearm;  Surgeon: Mal Misty, MD;  Location: [place];  Service: Vascular;  Laterality: Left;  . CHOLECYSTECTOMY    . KNEE ARTHROSCOPY Right 08/10/2014   Procedure: ARTHROSCOPY I & D KNEE;  Surgeon: Marianna Payment, MD;  [facility];  Service: Orthopedics;  Laterality: Right;  . KNEE ARTHROSCOPY Left 08/11/2014   Procedure: ARTHROSCOPIC WASHOUT LEFT KNEE;  Surgeon: Marianna Payment, MD;  Location: [place];  Service: Orthopedics;  Laterality: Left;  . KNEE ARTHROSCOPY Left 08/19/2014   Procedure: ARTHROSCOPIC WASHOUT LEFT KNEE;  Surgeon: Leandrew Koyanagi, MD;   Location: [place];  Service: Orthopedics;  Laterality: Left;  . KNEE ARTHROSCOPY WITH LATERAL MENISECTOMY Left 04/04/2015   Procedure: AND PARTIAL LATERAL MENISECTOMY;  Surgeon: Leandrew Koyanagi, MD;  Location: [place];  Service: Orthopedics;  Laterality: Left;  . KNEE ARTHROSCOPY WITH MEDIAL MENISECTOMY Left 04/04/2015   Procedure: LEFT KNEE ARTHROSCOPY WITH PARTIAL MEDIAL MENISCECTOMY  AND SYNOVECTOMY;  Surgeon: Leandrew Koyanagi, MD;  Location: [place];  Service: Orthopedics;  Laterality: Left;  . SHOULDER ARTHROSCOPY Bilateral 08/10/2014   Procedure: I & D BILATERAL SHOULDERS ;  Surgeon: Marianna Payment, MD;  [facility];  Service: Orthopedics;  Laterality: Bilateral;  . SMALL INTESTINE SURGERY     Due to Small Bowel Obstruction  . TEE WITHOUT CARDIOVERSION N/A 08/14/2014   Procedure: TRANSESOPHAGEAL ECHOCARDIOGRAM (TEE);  Surgeon: Thayer Headings, MD;  Location: [place];  Service: Cardiovascular;  Laterality: N/A;  . TENOSYNOVECTOMY Right 08/11/2014   Procedure: RIGHT WRIST IRRIGATION AND DEBRIDEMENT, TENOSYNOVECTOMY;  Surgeon: Marianna Payment, MD;  Location: [place];  Service: Orthopedics;  Laterality: Right;  . TOTAL KNEE ARTHROPLASTY Left 11/07/2015   Procedure: LEFT TOTAL KNEE ARTHROPLASTY WITH REVISION OF IMPLANTS;  Surgeon: Leandrew Koyanagi, MD;  Location: [place];  Service: Orthopedics;  Laterality: Left;  . TUBAL LIGATION      There  were no vitals filed for this visit.      Subjective Assessment - 01/16/16 1342    Subjective I had 17 teeth pulled yesterday; my ankle is killing me. We are suppose to be working on it.    Currently in Pain? Yes   Pain Score 4    Pain Location Knee   Pain Orientation Left   Pain Descriptors / Indicators Sharp;Spasm   Aggravating Factors  walking   Pain Relieving Factors sitting   Multiple Pain Sites Yes   Pain Score 6   Pain Location Ankle   Pain Orientation Medial   Pain Descriptors / Indicators Throbbing    Aggravating Factors  lay down and it throbs   Pain Relieving Factors ice                         [facility] Adult PT Treatment/Exercise - 01/16/16 0001      High Level Balance   High Level Balance Comments narrow and staggered stance with head turn and trunk rotations, sie stepping and retro wlaking with 1 UE support.      Knee/Hip Exercises: Aerobic   Nustep L5 6 minutes     Knee/Hip Exercises: Standing   Lateral Step Up Left;1 set;10 reps;Hand Hold: 2;Step Height: 4"   Lateral Step Up Limitations cues to control knee eccentrically and avoid hyperext    Forward Step Up 1 set;10 reps;Hand Hold: 2;Step Height: 6"   Forward Step Up Limitations cues for technique, quad control   Functional Squat 2 sets;10 reps   Functional Squat Limitations heavy cues for technique, alignment   Gait Training pregait weight shifting, sctivating quads x 20 each foot back.      Manual Therapy   Kinesiotex Ligament Correction  Posterior tibialis                   PT Short Term Goals - 01/07/16 1535      PT SHORT TERM GOAL #1   Title Pt will be able to be I with HEP for knee AROM/AAROM    Status Partially Met     PT SHORT TERM GOAL #2   Title Pt will be able to bend knee in sitting to 95 deg for improved transfers   Status Achieved     PT SHORT TERM GOAL #3   Title Pt will be able to understand RICE, edema mgmt    Status Achieved           PT Long Term Goals - 01/07/16 1535      PT LONG TERM GOAL #1   Title Pt will be able to be I with more advanced HEP    Status On-going     PT LONG TERM GOAL #2   Title Pt will score FOTO less than 48% limited   Status Unable to assess     PT LONG TERM GOAL #3   Title Pt will be able to walk with LRAD in community as needed with min increase in knee pain.    Status On-going     PT LONG TERM GOAL #4   Title Pt will demo knee AROM flexion to 110 deg for improved transfers.    Status Partially Met     PT LONG TERM GOAL #5    Title Pt will demo 4+/5 in L knee for improved stability with gait, function with stairs.    Status On-going     PT LONG TERM GOAL #6  Title Balance goal to be assessed.    Status Unable to assess               Plan - 01/16/16 1358    Clinical Impression Statement Pt reports she brought script for her ankle on her seconds visit. Trial of KT tape for medial ankle pain. Ankle to be evaluated next visit. Began step ups for strengthening with pt requiring max cues to engage quad with movements. Used parallel bars for pre gait exercises and balance. CGA for all.    PT Next Visit Plan assess KT tape for medial ankle pain, RE-EVAL to add ankle, set BERG GOAL.       Patient will benefit from skilled therapeutic intervention in order to improve the following deficits and impairments:  Abnormal gait, Decreased activity tolerance, Decreased balance, Decreased mobility, Decreased strength, Increased edema, Impaired sensation, Postural dysfunction, Impaired flexibility, Hypomobility, Decreased scar mobility, Decreased skin integrity, Pain, Decreased range of motion, Difficulty walking, Increased fascial restricitons  Visit Diagnosis: Localized edema  Pain in left knee  Difficulty in walking, not elsewhere classified     Problem List Patient Active Problem List   Diagnosis Date Noted  . Chronic diastolic CHF (congestive heart failure) ([place]) 11/08/2015  . Uncontrolled type 2 diabetes mellitus with complication ([place])   . Status post total left knee replacement   . Essential hypertension   . Acute blood loss anemia   . Diabetic peripheral neuropathy ([place])   . Anxiety state   . Depression   . Knee joint replacement status 11/07/2015  . Total knee replacement status 11/07/2015  . Normocytic anemia 11/07/2015  . Diabetes mellitus with complication ([place])   . Chronic kidney disease (CKD), stage IV (severe) ([place])   . Postoperative surgical complication involving circulatory system   .  Physical debility 09/14/2014  . Acute renal failure syndrome ([place])   . Hep C w/ coma, chronic ([place])   . Joint infection ([place])   . Acute renal failure ([place])   . Hepatitis C 08/16/2014  . SIRS (systemic inflammatory response syndrome) ([name])   . Benign essential HTN   . Septic joint of right hand ([place])   . Staphylococcus aureus bacteremia   . Septic joint of right knee joint ([place])   . ESRD (end stage renal disease) on dialysis ([place])   . Tachycardia 08/07/2014  . Septic joint of left shoulder region ([place]) 08/07/2014  . Sinus tachycardia ([place]) 08/07/2014  . BP (high blood pressure) 06/24/2013  . Disorder of peripheral nervous system ([place]) 06/24/2013  . 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 Diabetes mellitus, type 2 ([name]) 06/24/2013  . Uncontrolled hypertension 02/18/2013  . Type II or unspecified type diabetes mellitus without mention of complication, uncontrolled 02/18/2013  . Chest pain at rest 10/18/2011    Class: Acute  . Shortness of breath 10/18/2011    Class: Acute    Dorene Ar, PTA 01/16/2016, 2:29 PM  [facility] [address] [place], [place], [address] Phone: [phone]   Fax:  [phone]  Name: Brandy Houston MRN: [id number] Date of Birth: Jul 10, 1955

## 2016-01-18 ENCOUNTER — Ambulatory Visit: Payer: Medicare Other | Admitting: Physical Therapy

## 2016-01-18 DIAGNOSIS — M6281 Muscle weakness (generalized): Secondary | ICD-10-CM

## 2016-01-18 DIAGNOSIS — M25672 Stiffness of left ankle, not elsewhere classified: Secondary | ICD-10-CM | POA: Diagnosis not present

## 2016-01-18 DIAGNOSIS — R262 Difficulty in walking, not elsewhere classified: Secondary | ICD-10-CM

## 2016-01-18 DIAGNOSIS — R600 Localized edema: Secondary | ICD-10-CM | POA: Diagnosis not present

## 2016-01-18 DIAGNOSIS — M25562 Pain in left knee: Secondary | ICD-10-CM | POA: Diagnosis not present

## 2016-01-18 DIAGNOSIS — R609 Edema, unspecified: Secondary | ICD-10-CM

## 2016-01-18 NOTE — Therapy (Signed)
[place], [place], [address] Phone: [phone]   Fax:  [phone]  Physical Therapy Evaluation  Patient Details  Name: Brandy Houston MRN: [id number] Date of Birth: 1956-05-06 Referring Provider: Dr. [name]   Encounter Date: 01/18/2016      PT End of Session - 01/18/16 1132    Visit Number 9   Number of Visits 21   Date for PT Re-Evaluation 02/29/16   PT Start Time 1100   PT Stop Time 1150   PT Time Calculation (min) 50 min   Activity Tolerance Patient tolerated treatment well   Behavior During Therapy [facility] for tasks assessed/performed      Past Medical History:  Diagnosis Date  . Anemia   . Anxiety   . Chronic diastolic CHF (congestive heart failure) ([place])   . Chronic kidney disease    stage IV. previous HD, none currently 10/30/15  . Depression    Chronic  . Diabetes mellitus    type 2, IDDM  . Diabetic peripheral neuropathy ([place])   . Dysrhythmia    tachycardia, normal ECHO 08-09-14  . GERD (gastroesophageal reflux disease)   . Hepatitis C 2016  . Hypertension   . Osteoarthritis of left knee   . Peripheral neuropathy ([place])   . Protein calorie malnutrition ([place])   . Septic arthritis ([place])   . Slow transit constipation   . Unsteady gait     Past Surgical History:  Procedure Laterality Date  . APPENDECTOMY    . AV FISTULA PLACEMENT Left 09/13/2014   Procedure: Brachial Artery to Brachial Vein Gortex Four - Seven Stretch GRAFT INSERTION Left Forearm;  Surgeon: Mal Misty, MD;  Location: [facility];  Service: Vascular;  Laterality: Left;  . CHOLECYSTECTOMY    . KNEE ARTHROSCOPY Right 08/10/2014   Procedure: ARTHROSCOPY I & D KNEE;  Surgeon: Marianna Payment, MD;  [facility];  Service: Orthopedics;  Laterality: Right;  . KNEE ARTHROSCOPY Left 08/11/2014   Procedure: ARTHROSCOPIC WASHOUT LEFT KNEE;  Surgeon: Marianna Payment, MD;  Location: [facility];  Service: Orthopedics;  Laterality: Left;   . KNEE ARTHROSCOPY Left 08/19/2014   Procedure: ARTHROSCOPIC WASHOUT LEFT KNEE;  Surgeon: Leandrew Koyanagi, MD;  Location: [facility];  Service: Orthopedics;  Laterality: Left;  . KNEE ARTHROSCOPY WITH LATERAL MENISECTOMY Left 04/04/2015   Procedure: AND PARTIAL LATERAL MENISECTOMY;  Surgeon: Leandrew Koyanagi, MD;  Location: [facility];  Service: Orthopedics;  Laterality: Left;  . KNEE ARTHROSCOPY WITH MEDIAL MENISECTOMY Left 04/04/2015   Procedure: LEFT KNEE ARTHROSCOPY WITH PARTIAL MEDIAL MENISCECTOMY  AND SYNOVECTOMY;  Surgeon: Leandrew Koyanagi, MD;  Location: [facility];  Service: Orthopedics;  Laterality: Left;  . SHOULDER ARTHROSCOPY Bilateral 08/10/2014   Procedure: I & D BILATERAL SHOULDERS ;  Surgeon: Marianna Payment, MD;  [facility];  Service: Orthopedics;  Laterality: Bilateral;  . SMALL INTESTINE SURGERY     Due to Small Bowel Obstruction  . TEE WITHOUT CARDIOVERSION N/A 08/14/2014   Procedure: TRANSESOPHAGEAL ECHOCARDIOGRAM (TEE);  Surgeon: Thayer Headings, MD;  Location: [facility];  Service: Cardiovascular;  Laterality: N/A;  . TENOSYNOVECTOMY Right 08/11/2014   Procedure: RIGHT WRIST IRRIGATION AND DEBRIDEMENT, TENOSYNOVECTOMY;  Surgeon: Marianna Payment, MD;  Location: [facility];  Service: Orthopedics;  Laterality: Right;  . TOTAL KNEE ARTHROPLASTY Left 11/07/2015   Procedure: LEFT TOTAL KNEE ARTHROPLASTY WITH REVISION OF IMPLANTS;  Surgeon: Leandrew Koyanagi, MD;  [facility];  Service: Orthopedics;  Laterality: Left;  . TUBAL LIGATION      There were no vitals filed for this visit.       Subjective Assessment - 01/18/16 1107    Subjective Has knee muscle spasms when I turn it intemittently throughout the day.  L foot/ankle has been hurting for about a year.  I broke my ankle about 25 yrs ago.     Pertinent History kidney disease, DM, HTN   Limitations Standing;Walking;House hold activities;Other (comment);Sitting;Lifting   How long can you stand  comfortably? 15 min    How long can you walk comfortably? 15-30 min    Patient Stated Goals Pt wants to be able to walk without a walker, move around as she used to    Currently in Pain? Yes   Pain Score 7    Pain Location Ankle   Pain Orientation Left;Medial   Pain Descriptors / Indicators Throbbing;Aching;Burning   Pain Type Chronic pain   Pain Onset More than a month ago   Pain Frequency Constant   Aggravating Factors  walking   Pain Relieving Factors rest, icy hot, muscle relaxers and pain pills (ran out)    Pain Score 7   Pain Location Knee   Pain Orientation Anterior;Left   Pain Descriptors / Indicators Aching   Pain Type Surgical pain   Pain Onset More than a month ago   Pain Frequency Intermittent            [facility] PT Assessment - 01/18/16 1112      Assessment   Medical Diagnosis L post tibialis tendonitis   Referring Provider Dr. [name]    Onset Date/Surgical Date --  Rx 12/21/15   Prior Therapy Yes but not for ankle.      Circumferential Edema   Circumferential - Right 20.25 inch   Rt. knee 14.5 inch    Circumferential - Left  20.75 inch   knee 17.5 inch      AROM   Right Ankle Dorsiflexion 10   Right Ankle Plantar Flexion 60   Right Ankle Inversion 30   Right Ankle Eversion 30   Left Ankle Dorsiflexion 5   Left Ankle Plantar Flexion 50   Left Ankle Inversion 20   Left Ankle Eversion 12                   [facility] Adult PT Treatment/Exercise - 01/18/16 1128      Knee/Hip Exercises: Aerobic   Nustep L 5,  6 min      Knee/Hip Exercises: Supine   Quad Sets Strengthening   Short Arc Quad Sets Strengthening;Left;1 set;20 reps   Short Arc Quad Sets Limitations 3 lbs    Heel Slides Left;1 set;10 reps   Bridges Limitations x 10   Straight Leg Raises Strengthening;Left;1 set;10 reps   Straight Leg Raise with External Rotation Strengthening;Left;1 set;10 reps     Ankle Exercises: Stretches   Plantar Fascia Stretch 2 reps;30 seconds   Plantar Fascia  Stretch Limitations used towel to mobilize foot as well      Ankle Exercises: Supine   Other Supine Ankle Exercises ankle pumps, circles x20                 PT Education - 01/18/16 1238    Education provided Yes   Education Details HEP for ankle, inflammation, anatomy, diagnosis   Person(s) Educated Patient   Methods Explanation;Demonstration;Handout   Comprehension Verbalized understanding;Need further instruction  PT Short Term Goals - 02-11-2016 1120      PT SHORT TERM GOAL #1   Title Pt will be able to be I with HEP for knee AROM/AAROM    Status Partially Met     PT SHORT TERM GOAL #2   Title Pt will be able to bend knee in sitting to 95 deg for improved transfers   Status Achieved     PT SHORT TERM GOAL #3   Title Pt will be able to understand RICE, edema mgmt    Status Achieved           PT Long Term Goals - 02/11/2016 1135      PT LONG TERM GOAL #1   Title Pt will be able to be I with more advanced HEP    Status On-going     PT LONG TERM GOAL #2   Title Pt will score FOTO less than 48% limited   Baseline low battery, unable to do    Status Unable to assess     PT LONG TERM GOAL #3   Title Pt will be able to walk with LRAD in community as needed with min increase in knee pain.    Baseline still 15-30 min and pain can go up to 3/10-4/10.  With 2/10.    Status On-going     PT LONG TERM GOAL #4   Title Pt will demo knee AROM flexion to 110 deg for improved transfers.    Status Partially Met     PT LONG TERM GOAL #5   Title Pt will demo 4+/5 in L knee for improved stability with gait, function with stairs.    Status On-going     PT LONG TERM GOAL #6   Title Balance goal 44/56.    Status On-going               Plan - 02-11-16 1132    Clinical Impression Statement Pt was evaluated (low complexity) for ongoing L ankle pain. She is improving overall in L knee function, swelling and AROM but cont to have abnormal gait and poor  eccentric control. Will incorporate L ankle/foot into POC to include HEP and modalities.    Rehab Potential Good   Clinical Impairments Affecting Rehab Potential LE edema bilateral L>R   PT Frequency 2x / week   PT Duration 6 weeks   PT Treatment/Interventions ADLs/Self Care Home Management;Balance training;Manual lymph drainage;Neuromuscular re-education;Scar mobilization;DME Instruction;Patient/family education;Gait training;Stair training;Passive range of motion;Functional mobility training;Electrical Stimulation;Cryotherapy;Therapeutic exercise;Therapeutic activities;Manual techniques;Vasopneumatic Device;Taping   PT Next Visit Plan assess KT tape for medial ankle pain,  set BERG GOAL.    PT Home Exercise Plan continue.  use quads with walking ankle AROM    Consulted and Agree with Plan of Care Patient      Patient will benefit from skilled therapeutic intervention in order to improve the following deficits and impairments:  Abnormal gait, Decreased activity tolerance, Decreased balance, Decreased mobility, Decreased strength, Increased edema, Impaired sensation, Postural dysfunction, Impaired flexibility, Hypomobility, Decreased scar mobility, Decreased skin integrity, Pain, Decreased range of motion, Difficulty walking, Increased fascial restricitons  Visit Diagnosis: Localized edema  Pain in left knee  Difficulty in walking, not elsewhere classified  Edema, unspecified  Stiffness of left ankle, not elsewhere classified  Muscle weakness (generalized)      G-Codes - 02/11/2016 1121    Functional Assessment Tool Used FOTO    Functional Limitation Mobility: Walking and moving around   Mobility: Walking and Moving  Around Current Status ([id number]) At least 60 percent but less than 80 percent impaired, limited or restricted   Mobility: Walking and Moving Around Goal Status [phone]) At least 40 percent but less than 60 percent impaired, limited or restricted       Problem List Patient  Active Problem List   Diagnosis Date Noted  . Chronic diastolic CHF (congestive heart failure) ([place]) 11/08/2015  . Uncontrolled type 2 diabetes mellitus with complication ([place])   . Status post total left knee replacement   . Essential hypertension   . Acute blood loss anemia   . Diabetic peripheral neuropathy ([place])   . Anxiety state   . Depression   . Knee joint replacement status 11/07/2015  . Total knee replacement status 11/07/2015  . Normocytic anemia 11/07/2015  . Diabetes mellitus with complication ([place])   . Chronic kidney disease (CKD), stage IV (severe) ([place])   . Postoperative surgical complication involving circulatory system   . Physical debility 09/14/2014  . Acute renal failure syndrome ([place])   . Hep C w/ coma, chronic ([place])   . Joint infection ([place])   . Acute renal failure ([place])   . Hepatitis C 08/16/2014  . SIRS (systemic inflammatory response syndrome) ([name])   . Benign essential HTN   . Septic joint of right hand ([place])   . Staphylococcus aureus bacteremia   . Septic joint of right knee joint ([place])   . ESRD (end stage renal disease) on dialysis ([place])   . Tachycardia 08/07/2014  . Septic joint of left shoulder region ([place]) 08/07/2014  . Sinus tachycardia ([place]) 08/07/2014  . BP (high blood pressure) 06/24/2013  . Disorder of peripheral nervous system ([place]) 06/24/2013  . 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 Diabetes mellitus, type 2 ([place]) 06/24/2013  . Uncontrolled hypertension 02/18/2013  . Type II or unspecified type diabetes mellitus without mention of complication, uncontrolled 02/18/2013  . Chest pain at rest 10/18/2011    Class: Acute  . Shortness of breath 10/18/2011    Class: Acute    PAA,JENNIFER 01/18/2016, 12:41 PM  [facility] [address] [place], [place], [address] Phone: [phone]   Fax:  [phone]  Name: Brandy Houston MRN: [id number] Date of Birth: 10-05-1955  Raeford Razor,  PT 01/18/16 12:41 PM Phone: [phone] Fax: [phone]

## 2016-01-18 NOTE — Patient Instructions (Signed)
ROM: Inversion / Eversion   With left leg relaxed, gently turn ankle and foot in and out. Move through full range of motion. Avoid pain. Repeat _10___ times per set. Do ___1_ sets per session. Do _2___ sessions per day.  http://orth.exer.us/36   Copyright  VHI. All rights reserved.  ROM: Plantar / Dorsiflexion   With left leg relaxed, gently flex and extend ankle. Move through full range of motion. Avoid pain. Repeat __10__ times per set. Do _1___ sets per session. Do ___2_ sessions per day.  http://orth.exer.us/34   Copyright  VHI. All rights reserved.  Ankle Alphabet   Using left ankle and foot only, trace the letters of the alphabet. Perform A to Z. Repeat __1-2__ times per set. Do _1-2___ sets per session. Do __2__ sessions per day.  http://orth.exer.us/16   Copyright  VHI. All rights reserved.  Ankle Circles   Slowly rotate right foot and ankle clockwise then counterclockwise. Gradually increase range of motion. Avoid pain. Circle _10-20___ times each direction per set. Do __1__ sets per session. Do __2__ sessions per day.  http://orth.exer.us/30   Copyright  VHI. All rights reserved.     Gastroc / Heel Cord Stretch - Seated With Towel   Sit on floor, towel around ball of foot. Gently pull foot in toward body, stretching heel cord and calf. Hold for _30__ seconds. Repeat on involved leg. Repeat _3-5__ times. Do __2_ times per day.   Forefoot Evertors   Place right foot flat on towel, knee pointed forward. Use forefoot and toes to push towel out to side. Do not allow heel or knee to move. Hold ___10_ seconds. Repeat __10__ times. Do __1__ sessions per day. CAUTION: Repetitions should be slow and controlled.  Forefoot Invertors   Place right foot flat on towel, knee pointed forward. Use forefoot and toes to pull towel in toward center. Do not allow heel or knee to move. Hold ___10_ seconds. Repeat __10__ times. Do __1__ sessions per day. CAUTION:  Repetitions should be slow and controlled.   TOES: Towel Bunching   With involved straight toes on towel, bend toes bunching up towel __10_ times. Do __1_ times per day.  Copyright  VHI. All rights reserved.

## 2016-01-21 ENCOUNTER — Ambulatory Visit: Payer: Medicare Other | Admitting: Physical Therapy

## 2016-01-21 ENCOUNTER — Encounter (HOSPITAL_COMMUNITY): Payer: Medicare Other

## 2016-01-21 DIAGNOSIS — R600 Localized edema: Secondary | ICD-10-CM

## 2016-01-21 DIAGNOSIS — M25672 Stiffness of left ankle, not elsewhere classified: Secondary | ICD-10-CM | POA: Diagnosis not present

## 2016-01-21 DIAGNOSIS — R262 Difficulty in walking, not elsewhere classified: Secondary | ICD-10-CM | POA: Diagnosis not present

## 2016-01-21 DIAGNOSIS — M25562 Pain in left knee: Secondary | ICD-10-CM | POA: Diagnosis not present

## 2016-01-21 DIAGNOSIS — R609 Edema, unspecified: Secondary | ICD-10-CM

## 2016-01-21 DIAGNOSIS — M6281 Muscle weakness (generalized): Secondary | ICD-10-CM | POA: Diagnosis not present

## 2016-01-21 NOTE — Therapy (Signed)
[place], [place], [address] Phone: [phone]   Fax:  [phone]  Physical Therapy Treatment  Patient Details  Name: Brandy Houston MRN: [id number] Date of Birth: [birth year] Referring Provider: Dr. [name]   Encounter Date: 01/21/2016      PT End of Session - 01/21/16 1700    Visit Number 10   Number of Visits 21   Date for PT Re-Evaluation 02/29/16   PT Start Time 1548   PT Stop Time 1630   PT Time Calculation (min) 42 min   Activity Tolerance Patient tolerated treatment well   Behavior During Therapy [facility] for tasks assessed/performed      Past Medical History:  Diagnosis Date  . Anemia   . Anxiety   . Chronic diastolic CHF (congestive heart failure) ([place])   . Chronic kidney disease    stage IV. previous HD, none currently 10/30/15  . Depression    Chronic  . Diabetes mellitus    type 2, IDDM  . Diabetic peripheral neuropathy ([place])   . Dysrhythmia    tachycardia, normal ECHO 08-09-14  . GERD (gastroesophageal reflux disease)   . Hepatitis C 2016  . Hypertension   . Osteoarthritis of left knee   . Peripheral neuropathy ([place])   . Protein calorie malnutrition ([place])   . Septic arthritis ([place])   . Slow transit constipation   . Unsteady gait     Past Surgical History:  Procedure Laterality Date  . APPENDECTOMY    . AV FISTULA PLACEMENT Left 09/13/2014   Procedure: Brachial Artery to Brachial Vein Gortex Four - Seven Stretch GRAFT INSERTION Left Forearm;  Surgeon: Mal Misty, MD;  Location: [facility];  Service: Vascular;  Laterality: Left;  . CHOLECYSTECTOMY    . KNEE ARTHROSCOPY Right 08/10/2014   Procedure: ARTHROSCOPY I & D KNEE;  Surgeon: Marianna Payment, MD;  [facility];  Service: Orthopedics;  Laterality: Right;  . KNEE ARTHROSCOPY Left 08/11/2014   Procedure: ARTHROSCOPIC WASHOUT LEFT KNEE;  Surgeon: Marianna Payment, MD;  Location: [facility];  Service: Orthopedics;  Laterality: Left;   . KNEE ARTHROSCOPY Left 08/19/2014   Procedure: ARTHROSCOPIC WASHOUT LEFT KNEE;  Surgeon: Leandrew Koyanagi, MD;  Location: [facility];  Service: Orthopedics;  Laterality: Left;  . KNEE ARTHROSCOPY WITH LATERAL MENISECTOMY Left 04/04/2015   Procedure: AND PARTIAL LATERAL MENISECTOMY;  Surgeon: Leandrew Koyanagi, MD;  Location: [facility];  Service: Orthopedics;  Laterality: Left;  . KNEE ARTHROSCOPY WITH MEDIAL MENISECTOMY Left 04/04/2015   Procedure: LEFT KNEE ARTHROSCOPY WITH PARTIAL MEDIAL MENISCECTOMY  AND SYNOVECTOMY;  Surgeon: Leandrew Koyanagi, MD;  Location: [facility];  Service: Orthopedics;  Laterality: Left;  . SHOULDER ARTHROSCOPY Bilateral 08/10/2014   Procedure: I & D BILATERAL SHOULDERS ;  Surgeon: Marianna Payment, MD;  [facility];  Service: Orthopedics;  Laterality: Bilateral;  . SMALL INTESTINE SURGERY     Due to Small Bowel Obstruction  . TEE WITHOUT CARDIOVERSION N/A 08/14/2014   Procedure: TRANSESOPHAGEAL ECHOCARDIOGRAM (TEE);  Surgeon: Thayer Headings, MD;  Location: [facility];  Service: Cardiovascular;  Laterality: N/A;  . TENOSYNOVECTOMY Right 08/11/2014   Procedure: RIGHT WRIST IRRIGATION AND DEBRIDEMENT, TENOSYNOVECTOMY;  Surgeon: Marianna Payment, MD;  Location: [facility];  Service: Orthopedics;  Laterality: Right;  . TOTAL KNEE ARTHROPLASTY Left 11/07/2015   Procedure: LEFT TOTAL KNEE ARTHROPLASTY WITH REVISION OF IMPLANTS;  Surgeon: Leandrew Koyanagi, MD;  Location[facility];  Service: Orthopedics;  Laterality: Left;  . TUBAL LIGATION      There were no vitals filed for this visit.      Subjective Assessment - 01/21/16 1555    Subjective Had 17 teeth pulled out on Tuesday.  I am overdue iron IV.  I go Thursday for that.  I just don't feel good.  I'm on antibiotics 10 days to 2 weeks.     Currently in Pain? Yes   Pain Score 5    Pain Location Ankle   Pain Orientation Left;Right  right not as bad   Pain Descriptors / Indicators Throbbing  feels like a  turniquate (SP)   Pain Frequency Constant   Aggravating Factors  worse with laying down    Pain Relieving Factors I haven't found something that has helped a lot.  I have tried things they help a little.  Ice.    Effect of Pain on Daily Activities I can't sleep at night.    Pain Score 6   Pain Orientation Left   Pain Descriptors / Indicators Aching   Pain Type Surgical pain   Pain Frequency Constant            [facility] PT Assessment - 01/21/16 0001      Circumferential Edema   Circumferential - Left  18 1/8 inch                     [facility] Adult PT Treatment/Exercise - 01/21/16 0001      Knee/Hip Exercises: Aerobic   Nustep L5 6.5 minutes     Knee/Hip Exercises: Seated   Heel Slides 1 set;10 reps  cues for holding longer     Manual Therapy   Manual Therapy --  She has had the dark spot about 2 weeks.     Manual therapy comments ankle, leg retrograde,  soft tissue work,  P/A mobs to long bone of foot and A/P mobs to fibula.  tissue more mobile,  Stiffness continues.    noted a 3cmX2cm dried dark skin area(Old bloodblister) foot      Ankle Exercises: Stretches   Other Stretch toe stretch,  foot stiff muliple cues   Other Stretch tennisball 2 minutes on sole of the foot     Ankle Exercises: Seated   Ankle Circles/Pumps Limitations Limited motions   Towel Crunch --  10 reps   Heel Raises 10 reps   Heel Slides 10 reps  cues to keep flat,  stretch PF/DF,  hold each stretch     Ankle Exercises: Supine   Other Supine Ankle Exercises ankle pumps, circles x10   Other Supine Ankle Exercises isometric 3 way, manually resisted.5 x 5 each                  PT Short Term Goals - 01/21/16 1705      PT SHORT TERM GOAL #1   Title Pt will be able to be I with HEP for knee AROM/AAROM    Time 4   Period Weeks   Status Unable to assess     PT SHORT TERM GOAL #2   Title Pt will be able to bend knee in sitting to 95 deg for improved transfers   Status  Achieved     PT SHORT TERM GOAL #3   Title Pt will be able to understand RICE, edema mgmt    Status Achieved           PT Long  Term Goals - 01/21/16 1705      PT LONG TERM GOAL #1   Title Pt will be able to be I with more advanced HEP    Time 8   Period Weeks   Status On-going     PT LONG TERM GOAL #2   Title Pt will score FOTO less than 48% limited   Time 8   Period Weeks   Status Unable to assess     PT LONG TERM GOAL #3   Title Pt will be able to walk with LRAD in community as needed with min increase in knee pain.    Time 8   Period Weeks   Status On-going     PT LONG TERM GOAL #4   Title Pt will demo knee AROM flexion to 110 deg for improved transfers.    Time 8   Period Weeks   Status Unable to assess     PT LONG TERM GOAL #5   Title Pt will demo 4+/5 in L knee for improved stability with gait, function with stairs.    Time 8   Period Weeks   Status Unable to assess     PT LONG TERM GOAL #6   Title Balance goal 44/56.    Time 8   Period Weeks   Status Unable to assess               Plan - 01/21/16 1701    Clinical Impression Statement Less pain post session.  Tape helped a little.  She has a dried bloodblister appearing medial arch patient says she has had about 2 weeks.  She was not feeling well having 17 teeth removed and needing an Iron IV.  No new goals met.  Progress toward pain goal for foot.    PT Next Visit Plan   Continue knee and ankle exercises.  updateankle if ready   PT Home Exercise Plan try using a tennis ball along bottom of foot for pain control/mobility.   Consulted and Agree with Plan of Care Patient      Patient will benefit from skilled therapeutic intervention in order to improve the following deficits and impairments:  Abnormal gait, Decreased activity tolerance, Decreased balance, Decreased mobility, Decreased strength, Increased edema, Impaired sensation, Postural dysfunction, Impaired flexibility, Hypomobility,  Decreased scar mobility, Decreased skin integrity, Pain, Decreased range of motion, Difficulty walking, Increased fascial restricitons  Visit Diagnosis: Localized edema  Pain in left knee  Difficulty in walking, not elsewhere classified  Edema, unspecified  Stiffness of left ankle, not elsewhere classified  Muscle weakness (generalized)     Problem List Patient Active Problem List   Diagnosis Date Noted  . Chronic diastolic CHF (congestive heart failure) ([place]) 11/08/2015  . Uncontrolled type 2 diabetes mellitus with complication ([place])   . Status post total left knee replacement   . Essential hypertension   . Acute blood loss anemia   . Diabetic peripheral neuropathy ([place])   . Anxiety state   . Depression   . Knee joint replacement status 11/07/2015  . Total knee replacement status 11/07/2015  . Normocytic anemia 11/07/2015  . Diabetes mellitus with complication ([place])   . Chronic kidney disease (CKD), stage IV (severe) ([place])   . Postoperative surgical complication involving circulatory system   . Physical debility 09/14/2014  . Acute renal failure syndrome ([place])   . Hep C w/ coma, chronic ([place])   . Joint infection ([place])   . Acute renal failure ([place])   .  Hepatitis C 08/16/2014  . SIRS (systemic inflammatory response syndrome) ([name])   . Benign essential HTN   . Septic joint of right hand ([place])   . Staphylococcus aureus bacteremia   . Septic joint of right knee joint ([place])   . ESRD (end stage renal disease) on dialysis ([place])   . Tachycardia 08/07/2014  . Septic joint of left shoulder region ([place]) 08/07/2014  . Sinus tachycardia ([place]) 08/07/2014  . BP (high blood pressure) 06/24/2013  . Disorder of peripheral nervous system ([place]) 06/24/2013  . 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 Diabetes mellitus, type 2 ([place]) 06/24/2013  . Uncontrolled hypertension 02/18/2013  . Type II or unspecified type diabetes mellitus without mention of complication, uncontrolled  02/18/2013  . Chest pain at rest 10/18/2011    Class: Acute  . Shortness of breath 10/18/2011    Class: Acute    Brandy Houston 01/21/2016, 5:08 PM  [facility] [address] [place], [place], [address] Phone: [phone]   Fax:  [phone]  Name: Brandy Houston MRN: [id number] Date of Birth: 01-03-1956   Melvenia Needles, PTA 01/21/16 5:08 PM Phone: [phone] Fax: [phone]

## 2016-01-23 ENCOUNTER — Ambulatory Visit: Payer: Medicare Other | Admitting: Physical Therapy

## 2016-01-23 ENCOUNTER — Other Ambulatory Visit (HOSPITAL_COMMUNITY): Payer: Self-pay | Admitting: *Deleted

## 2016-01-23 DIAGNOSIS — M6281 Muscle weakness (generalized): Secondary | ICD-10-CM

## 2016-01-23 DIAGNOSIS — R262 Difficulty in walking, not elsewhere classified: Secondary | ICD-10-CM

## 2016-01-23 DIAGNOSIS — R600 Localized edema: Secondary | ICD-10-CM

## 2016-01-23 DIAGNOSIS — R609 Edema, unspecified: Secondary | ICD-10-CM | POA: Diagnosis not present

## 2016-01-23 DIAGNOSIS — M25672 Stiffness of left ankle, not elsewhere classified: Secondary | ICD-10-CM | POA: Diagnosis not present

## 2016-01-23 DIAGNOSIS — M25562 Pain in left knee: Secondary | ICD-10-CM

## 2016-01-23 NOTE — Therapy (Signed)
[place], [place], [address] Phone: [phone]   Fax:  [phone]  Physical Therapy Treatment  Patient Details  Name: Brandy Houston MRN: [id number] Date of Birth: [birth year] Referring Provider: Dr. [name]   Encounter Date: 01/23/2016      PT End of Session - 01/23/16 1459    Visit Number 11   Number of Visits 21   Date for PT Re-Evaluation 02/29/16   PT Start Time [id number]   PT Stop Time 1518   PT Time Calculation (min) 63 min   Activity Tolerance Patient tolerated treatment well   Behavior During Therapy [facility] for tasks assessed/performed      Past Medical History:  Diagnosis Date  . Anemia   . Anxiety   . Chronic diastolic CHF (congestive heart failure) ([place])   . Chronic kidney disease    stage IV. previous HD, none currently 10/30/15  . Depression    Chronic  . Diabetes mellitus    type 2, IDDM  . Diabetic peripheral neuropathy ([place])   . Dysrhythmia    tachycardia, normal ECHO 08-09-14  . GERD (gastroesophageal reflux disease)   . Hepatitis C 2016  . Hypertension   . Osteoarthritis of left knee   . Peripheral neuropathy ([place])   . Protein calorie malnutrition ([place])   . Septic arthritis ([place])   . Slow transit constipation   . Unsteady gait     Past Surgical History:  Procedure Laterality Date  . APPENDECTOMY    . AV FISTULA PLACEMENT Left 09/13/2014   Procedure: Brachial Artery to Brachial Vein Gortex Four - Seven Stretch GRAFT INSERTION Left Forearm;  Surgeon: Mal Misty, MD;  Location: [place];  Service: Vascular;  Laterality: Left;  . CHOLECYSTECTOMY    . KNEE ARTHROSCOPY Right 08/10/2014   Procedure: ARTHROSCOPY I & D KNEE;  Surgeon: Marianna Payment, MD;  Location: [facility];  Service: Orthopedics;  Laterality: Right;  . KNEE ARTHROSCOPY Left 08/11/2014   Procedure: ARTHROSCOPIC WASHOUT LEFT KNEE;  Surgeon: Marianna Payment, MD;  Location: [place] [place];  Service: Orthopedics;  Laterality: Left;   . KNEE ARTHROSCOPY Left 08/19/2014   Procedure: ARTHROSCOPIC WASHOUT LEFT KNEE;  Surgeon: Leandrew Koyanagi, MD;  Location: [facility];  Service: Orthopedics;  Laterality: Left;  . KNEE ARTHROSCOPY WITH LATERAL MENISECTOMY Left 04/04/2015   Procedure: AND PARTIAL LATERAL MENISECTOMY;  Surgeon: Leandrew Koyanagi, MD;  Location: [facility];  Service: Orthopedics;  Laterality: Left;  . KNEE ARTHROSCOPY WITH MEDIAL MENISECTOMY Left 04/04/2015   Procedure: LEFT KNEE ARTHROSCOPY WITH PARTIAL MEDIAL MENISCECTOMY  AND SYNOVECTOMY;  Surgeon: Leandrew Koyanagi, MD;  Location: [facility];  Service: Orthopedics;  Laterality: Left;  . SHOULDER ARTHROSCOPY Bilateral 08/10/2014   Procedure: I & D BILATERAL SHOULDERS ;  Surgeon: Marianna Payment, MD;  Location: [facility];  Service: Orthopedics;  Laterality: Bilateral;  . SMALL INTESTINE SURGERY     Due to Small Bowel Obstruction  . TEE WITHOUT CARDIOVERSION N/A 08/14/2014   Procedure: TRANSESOPHAGEAL ECHOCARDIOGRAM (TEE);  Surgeon: Thayer Headings, MD;  Location: [facility];  Service: Cardiovascular;  Laterality: N/A;  . TENOSYNOVECTOMY Right 08/11/2014   Procedure: RIGHT WRIST IRRIGATION AND DEBRIDEMENT, TENOSYNOVECTOMY;  Surgeon: Marianna Payment, MD;  Location: [place];  Service: Orthopedics;  Laterality: Right;  . TOTAL KNEE ARTHROPLASTY Left 11/07/2015   Procedure: LEFT TOTAL KNEE ARTHROPLASTY WITH REVISION OF IMPLANTS;  Surgeon: Leandrew Koyanagi, MD;  Location: [facility];  Service: Orthopedics;  Laterality: Left;  . TUBAL LIGATION      There were no vitals filed for this visit.      Subjective Assessment - 01/23/16 1420    Subjective Feeling better because I slept better last noght.  the tennis ball helped a lot.  My foot is not tight and i have not felt like that in a long time.    Currently in Pain? Yes   Pain Score --  mild   Pain Location Ankle   Pain Orientation Right;Left   Pain Relieving Factors tennis ball   Multiple Pain Sites Yes    Pain Relieving Factors rest, ice, elevation, rub  tylenol , muscle relaxers,  pain hydrocodone            [facility] PT Assessment - 01/23/16 0001      AROM   Left Knee Extension 0   Left Knee Flexion 115     Strength   Left Knee Flexion --                     [facility] Adult PT Treatment/Exercise - 01/23/16 0001      Ambulation/Gait   Gait Comments single point cane gait training in parallel bars and cane on level and steps with canre and 1 rail,  cues continue to be needed on steps.       Knee/Hip Exercises: Aerobic   Nustep L5, 6 minutes     Knee/Hip Exercises: Standing   Forward Step Up Left;1 set;10 reps;Hand Hold: 1;Step Height: 6";Limitations   Forward Step Up Limitations on stairs, simulation of home environment     Knee/Hip Exercises: Supine   Heel Slides 10 reps     Moist Heat Therapy   Number Minutes Moist Heat 10 Minutes   Moist Heat Location Knee     Manual Therapy   Manual therapy comments retrograde and gentle stretch into floxion                PT Education - 01/23/16 1459    Education provided Yes   Education Details step ups,  gait training   Person(s) Educated Patient   Methods Explanation;Demonstration;Tactile cues;Verbal cues;Handout   Comprehension Verbalized understanding;Returned demonstration;Need further instruction          PT Short Term Goals - 01/21/16 1705      PT SHORT TERM GOAL #1   Title Pt will be able to be I with HEP for knee AROM/AAROM    Time 4   Period Weeks   Status Unable to assess     PT SHORT TERM GOAL #2   Title Pt will be able to bend knee in sitting to 95 deg for improved transfers   Status Achieved     PT SHORT TERM GOAL #3   Title Pt will be able to understand RICE, edema mgmt    Status Achieved           PT Long Term Goals - 01/21/16 1705      PT LONG TERM GOAL #1   Title Pt will be able to be I with more advanced HEP    Time 8   Period Weeks   Status On-going     PT LONG TERM  GOAL #2   Title Pt will score FOTO less than 48% limited   Time 8   Period Weeks   Status Unable to assess     PT LONG TERM GOAL #3   Title  Pt will be able to walk with LRAD in community as needed with min increase in knee pain.    Time 8   Period Weeks   Status On-going     PT LONG TERM GOAL #4   Title Pt will demo knee AROM flexion to 110 deg for improved transfers.    Time 8   Period Weeks   Status Unable to assess     PT LONG TERM GOAL #5   Title Pt will demo 4+/5 in L knee for improved stability with gait, function with stairs.    Time 8   Period Weeks   Status Unable to assess     PT LONG TERM GOAL #6   Title Balance goal 44/56.    Time 8   Period Weeks   Status Unable to assess               Plan - 01/23/16 1500    Clinical Impression Statement Ankle pain resolved, pain free.  Able to progress home exercises.  patient said she suffered a knee injury when she was a child and she has not been the same since (Left knee)   PT Next Visit Plan   Continue knee and ankle exercises.     Consulted and Agree with Plan of Care Patient      Patient will benefit from skilled therapeutic intervention in order to improve the following deficits and impairments:  Abnormal gait, Decreased activity tolerance, Decreased balance, Decreased mobility, Decreased strength, Increased edema, Impaired sensation, Postural dysfunction, Impaired flexibility, Hypomobility, Decreased scar mobility, Decreased skin integrity, Pain, Decreased range of motion, Difficulty walking, Increased fascial restricitons  Visit Diagnosis: Localized edema  Pain in left knee  Difficulty in walking, not elsewhere classified  Edema, unspecified  Stiffness of left ankle, not elsewhere classified  Muscle weakness (generalized)     Problem List Patient Active Problem List   Diagnosis Date Noted  . Chronic diastolic CHF (congestive heart failure) ([place]) 11/08/2015  . Uncontrolled type 2 diabetes  mellitus with complication ([place])   . Status post total left knee replacement   . Essential hypertension   . Acute blood loss anemia   . Diabetic peripheral neuropathy ([place])   . Anxiety state   . Depression   . Knee joint replacement status 11/07/2015  . Total knee replacement status 11/07/2015  . Normocytic anemia 11/07/2015  . Diabetes mellitus with complication ([place])   . Chronic kidney disease (CKD), stage IV (severe) ([place])   . Postoperative surgical complication involving circulatory system   . Physical debility 09/14/2014  . Acute renal failure syndrome ([place])   . Hep C w/ coma, chronic ([place])   . Joint infection ([place])   . Acute renal failure ([place])   . Hepatitis C 08/16/2014  . SIRS (systemic inflammatory response syndrome) ([name])   . Benign essential HTN   . Septic joint of right hand ([place])   . Staphylococcus aureus bacteremia   . Septic joint of right knee joint ([place])   . ESRD (end stage renal disease) on dialysis ([place])   . Tachycardia 08/07/2014  . Septic joint of left shoulder region ([place]) 08/07/2014  . Sinus tachycardia ([place]) 08/07/2014  . BP (high blood pressure) 06/24/2013  . Disorder of peripheral nervous system ([place]) 06/24/2013  . 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 Diabetes mellitus, type 2 ([place]) 06/24/2013  . Uncontrolled hypertension 02/18/2013  . Type II or unspecified type diabetes mellitus without mention of complication, uncontrolled 02/18/2013  .  Chest pain at rest 10/18/2011    Class: Acute  . Shortness of breath 10/18/2011    Class: Acute    Cayle Thunder 01/23/2016, 3:03 PM  [facility] [address] [place], [place], [address] Phone: [phone]   Fax:  [phone]  Name: Brandy Houston MRN: [id number] Date of Birth: May 07, 1956   Melvenia Needles, PTA 01/23/16 3:03 PM Phone: [phone] Fax: [phone]

## 2016-01-24 ENCOUNTER — Ambulatory Visit (HOSPITAL_COMMUNITY)
Admission: RE | Admit: 2016-01-24 | Discharge: 2016-01-24 | Disposition: A | Discharge status: Home / Self Care | Payer: Medicare Other | Source: Ambulatory Visit | Attending: Nephrology | Admitting: Nephrology

## 2016-01-24 DIAGNOSIS — D509 Iron deficiency anemia, unspecified: Secondary | ICD-10-CM | POA: Insufficient documentation

## 2016-01-24 MED ORDER — SODIUM CHLORIDE 0.9 % IV SOLN
510.0000 mg | Freq: Once | INTRAVENOUS | Status: AC
  Administered 2016-01-24: 510 mg via INTRAVENOUS
  Filled 2016-01-24: qty 17

## 2016-01-27 ENCOUNTER — Emergency Department (HOSPITAL_COMMUNITY)
Admission: EM | Admit: 2016-01-27 | Discharge: 2016-01-27 | Disposition: A | Discharge status: Home / Self Care | Payer: Medicare Other | Attending: Emergency Medicine | Admitting: Emergency Medicine

## 2016-01-27 ENCOUNTER — Encounter (HOSPITAL_COMMUNITY): Payer: Self-pay

## 2016-01-27 DIAGNOSIS — Z992 Dependence on renal dialysis: Secondary | ICD-10-CM | POA: Insufficient documentation

## 2016-01-27 DIAGNOSIS — I5032 Chronic diastolic (congestive) heart failure: Secondary | ICD-10-CM | POA: Insufficient documentation

## 2016-01-27 DIAGNOSIS — D62 Acute posthemorrhagic anemia: Secondary | ICD-10-CM

## 2016-01-27 DIAGNOSIS — Z794 Long term (current) use of insulin: Secondary | ICD-10-CM | POA: Diagnosis not present

## 2016-01-27 DIAGNOSIS — R5383 Other fatigue: Secondary | ICD-10-CM | POA: Diagnosis present

## 2016-01-27 DIAGNOSIS — E1122 Type 2 diabetes mellitus with diabetic chronic kidney disease: Secondary | ICD-10-CM | POA: Insufficient documentation

## 2016-01-27 DIAGNOSIS — K08409 Partial loss of teeth, unspecified cause, unspecified class: Secondary | ICD-10-CM | POA: Diagnosis not present

## 2016-01-27 DIAGNOSIS — K08199 Complete loss of teeth due to other specified cause, unspecified class: Secondary | ICD-10-CM | POA: Diagnosis not present

## 2016-01-27 DIAGNOSIS — Z79899 Other long term (current) drug therapy: Secondary | ICD-10-CM | POA: Insufficient documentation

## 2016-01-27 DIAGNOSIS — F17210 Nicotine dependence, cigarettes, uncomplicated: Secondary | ICD-10-CM | POA: Insufficient documentation

## 2016-01-27 DIAGNOSIS — Z7982 Long term (current) use of aspirin: Secondary | ICD-10-CM | POA: Insufficient documentation

## 2016-01-27 DIAGNOSIS — I132 Hypertensive heart and chronic kidney disease with heart failure and with stage 5 chronic kidney disease, or end stage renal disease: Secondary | ICD-10-CM | POA: Insufficient documentation

## 2016-01-27 DIAGNOSIS — N186 End stage renal disease: Secondary | ICD-10-CM | POA: Diagnosis not present

## 2016-01-27 LAB — CBC WITH DIFFERENTIAL/PLATELET
Basophils Absolute: 0 10*3/uL (ref 0.0–0.1)
Basophils Absolute: 0 10*3/uL (ref 0.0–0.1)
Basophils Relative: 0 %
Basophils Relative: 1 %
Eosinophils Absolute: 0.1 10*3/uL (ref 0.0–0.7)
Eosinophils Absolute: 0.1 10*3/uL (ref 0.0–0.7)
Eosinophils Relative: 1 %
Eosinophils Relative: 1 %
HCT: 32.5 % — ABNORMAL LOW (ref 36.0–46.0)
HCT: 27.4 % — ABNORMAL LOW (ref 36.0–46.0)
Hemoglobin: 10.4 g/dL — ABNORMAL LOW (ref 12.0–15.0)
Hemoglobin: 8.8 g/dL — ABNORMAL LOW (ref 12.0–15.0)
Lymphocytes Relative: 16 %
Lymphocytes Relative: 22 %
Lymphs Abs: 1.4 10*3/uL (ref 0.7–4.0)
Lymphs Abs: 1.6 10*3/uL (ref 0.7–4.0)
MCH: 28.3 pg (ref 26.0–34.0)
MCH: 28.5 pg (ref 26.0–34.0)
MCHC: 32 g/dL (ref 30.0–36.0)
MCHC: 32.1 g/dL (ref 30.0–36.0)
MCV: 88.3 fL (ref 78.0–100.0)
MCV: 88.7 fL (ref 78.0–100.0)
Monocytes Absolute: 0.8 10*3/uL (ref 0.1–1.0)
Monocytes Absolute: 0.7 10*3/uL (ref 0.1–1.0)
Monocytes Relative: 10 %
Monocytes Relative: 10 %
Neutro Abs: 6.1 10*3/uL (ref 1.7–7.7)
Neutro Abs: 4.9 10*3/uL (ref 1.7–7.7)
Neutrophils Relative %: 73 %
Neutrophils Relative %: 66 %
Platelets: 192 10*3/uL (ref 150–400)
Platelets: 214 10*3/uL (ref 150–400)
RBC: 3.68 MIL/uL — ABNORMAL LOW (ref 3.87–5.11)
RBC: 3.09 MIL/uL — ABNORMAL LOW (ref 3.87–5.11)
RDW: 19.2 % — ABNORMAL HIGH (ref 11.5–15.5)
RDW: 19.4 % — ABNORMAL HIGH (ref 11.5–15.5)
WBC: 8.4 10*3/uL (ref 4.0–10.5)
WBC: 7.3 10*3/uL (ref 4.0–10.5)

## 2016-01-27 LAB — BASIC METABOLIC PANEL
Anion gap: 8 (ref 5–15)
BUN: 34 mg/dL — ABNORMAL HIGH (ref 6–20)
CO2: 33 mmol/L — ABNORMAL HIGH (ref 22–32)
Calcium: 8.6 mg/dL — ABNORMAL LOW (ref 8.9–10.3)
Chloride: 99 mmol/L — ABNORMAL LOW (ref 101–111)
Creatinine, Ser: 2.23 mg/dL — ABNORMAL HIGH (ref 0.44–1.00)
GFR calc Af Amer: 26 mL/min — ABNORMAL LOW (ref >60)
GFR calc non Af Amer: 23 mL/min — ABNORMAL LOW (ref >60)
Glucose, Bld: 117 mg/dL — ABNORMAL HIGH (ref 65–99)
Potassium: 3.1 mmol/L — ABNORMAL LOW (ref 3.5–5.1)
Sodium: 140 mmol/L (ref 135–145)

## 2016-01-27 MED ORDER — OXYCODONE HCL 5 MG PO TABS
5.0000 mg | ORAL_TABLET | Freq: Once | ORAL | Status: AC
  Administered 2016-01-27: 5 mg via ORAL
  Filled 2016-01-27: qty 1

## 2016-01-27 MED ORDER — SODIUM CHLORIDE 0.9 % IV BOLUS (SEPSIS)
500.0000 mL | Freq: Once | INTRAVENOUS | Status: AC
  Administered 2016-01-27: 500 mL via INTRAVENOUS

## 2016-01-27 MED ORDER — BUPIVACAINE-EPINEPHRINE (PF) 0.5% -1:200000 IJ SOLN
INTRAMUSCULAR | Status: AC
  Filled 2016-01-27: qty 1.8

## 2016-01-27 MED ORDER — "THROMBI-PAD 3""X3"" EX PADS"
1.0000 | MEDICATED_PAD | Freq: Once | CUTANEOUS | Status: AC
  Administered 2016-01-27: 1 via TOPICAL

## 2016-01-27 MED ORDER — "THROMBI-PAD 3""X3"" EX PADS"
MEDICATED_PAD | CUTANEOUS | Status: AC
  Filled 2016-01-27: qty 1

## 2016-01-27 MED ORDER — "THROMBI-PAD 3""X3"" EX PADS"
1.0000 | MEDICATED_PAD | Freq: Once | CUTANEOUS | Status: AC
  Administered 2016-01-27: 1 via TOPICAL
  Filled 2016-01-27: qty 1

## 2016-01-27 MED ORDER — "THROMBI-PAD 3""X3"" EX PADS"
MEDICATED_PAD | CUTANEOUS | Status: AC
  Administered 2016-01-27: 1 via TOPICAL
  Filled 2016-01-27: qty 1

## 2016-01-27 MED ORDER — TRANEXAMIC ACID 1000 MG/10ML IV SOLN
500.0000 mg | Freq: Once | INTRAVENOUS | Status: DC
  Filled 2016-01-27: qty 10

## 2016-01-27 NOTE — ED Triage Notes (Signed)
Patient states that had teeth extracted on 01/15/2016.  Yesterday patient stated that mouth began to bleed and she is spitting out clots.  Patient denies any injury and denies pain.  Patient states that does take 81mg  aspirin daily.

## 2016-01-27 NOTE — ED Notes (Signed)
PA and nurse at bedside labs delayed

## 2016-01-27 NOTE — ED Notes (Signed)
300 mL of blood in suction canister. Pt has also saturated a roll of combat gauze, 1 Thrombi pad, and 3 other packages of gauze since 0300.

## 2016-01-27 NOTE — ED Notes (Signed)
Her recent dental procedure was performed by [facility] [phone] in [place], [place]

## 2016-01-27 NOTE — ED Notes (Signed)
Tranexamic acid held per MD orders due to controlled bleeding at this time. Pt is being held for observation due to low Hgb.

## 2016-01-27 NOTE — ED Notes (Signed)
Dr. Eulis Foster made aware of patient.  Suction set up at bedside.

## 2016-01-27 NOTE — ED Provider Notes (Signed)
.  Nerve Block Date/Time: 01/27/2016 7:51 AM Performed by: Margarita Mail Authorized by: Margarita Mail   Consent:    Consent obtained:  Verbal   Consent given by:  Patient Indications:    Indications:  Procedural anesthesia Location:    Nerve block body site: Mouth: Left upper apical and palatine. Procedure details (see MAR for exact dosages):    Block needle gauge:  27 G   Anesthetic injected:  Bupivacaine 0.5% WITH epi   Steroid injected:  None   Additive injected:  None   Injection procedure:  Anatomic landmarks identified and anatomic landmarks palpated   Paresthesia:  Immediately resolved Post-procedure details:    Patient tolerance of procedure:  Tolerated with difficulty Wound repair Date/Time: 01/27/2016 7:58 AM Performed by: Margarita Mail Authorized by: Margarita Mail  Consent: Verbal consent obtained. Patient identity confirmed: verbally with patient and provided demographic data Comments: Patient with bleeding from an open socket post dental extraction site of the left upper premolar. After anesthesia was achieved and prolonged direct pressure, a Heme-Con  dressing was cut to fit, however. Bleeding was too brisk to keep the dressing retained within the socket. I attempted pursestring suture using 4.0 Vicryl without successful closure or resolution of bleeding.     8:16 AM  Vitals:   01/27/16 0600 01/27/16 0630 01/27/16 0645 01/27/16 0805  BP: 151/60 166/61  182/84  Pulse:   77 76  Resp:      SpO2:   95% 99%    I assumed care of the patient from Dr. Eulis Foster. Patient here for bleeding after tooth extractions. She lost > 400cc Blood with a 2 g drop in her hgb.  She appears to be compensating Well. We have achieved hemostasis with insertion of a small portion of, gauze within the wound itself. I discussed follow-up care regarding her blood loss with Dr.Schertz . He states that she does not need close follow-up. Impression becomes symptomatic. I discussed symptoms  of acute blood loss anemia with the patient to include tachycardia, shortness of breath, feelings of syncope, extreme fatigue. I discussed reasons for her to seek immediate medical care. I was also able to speak with a clinical manager at the oral surgery office where the patient had her extractions. He recommends can continuing to placed a gauze within the socket. And she may remove it gently before sleep. Patient is to follow-up tomorrow with the oral surgeon. I have also discussed reasons to return to include uncontrolled bleeding of the gums. She was safe for discharge at this time   Margarita Mail, PA-C 01/27/16 [place], MD 01/29/16 1055

## 2016-01-27 NOTE — Discharge Instructions (Signed)
Please remove the small piece of gauze very gently from your socket before falling asleep.  Do not chew on that side. Return to the ER if your bleeding starts up again.

## 2016-01-27 NOTE — ED Provider Notes (Signed)
[facility] DEPT Provider Note   CSN: [id number] Arrival date & time: 01/27/16  0012  By signing my name below, I, Irene Pap, attest that this documentation has been prepared under the direction and in the presence of Daleen Bo, MD. Electronically Signed: Irene Pap, ED Scribe. 01/27/16. 12:49 AM.  First Provider Contact:  None    History   Chief Complaint Chief Complaint  Patient presents with  . Mouth Injury   The history is provided by the patient. No language interpreter was used.   HPI Comments: Brandy Houston is a 60 y.o. Female with a hx of CHF, DM, HTN, SIRS, Hepatitis C, and ESRD who presents to the Emergency Department complaining of left sided mouth bleeding onset one day ago. Pt reports that she had teeth extracted on 01/15/16 and began to have bleeding yesterday. She reports associated fatigue and dizziness. Pt states that she sucked on some candy yesterday. She notes that she is spitting out clots. Pt is actively bleeding in the ED. She has been using gauze over the area to no relief. Pt takes 81 mg aspirin daily. She denies injury to the area, fever, chills, or pain to the area.   Past Medical History:  Diagnosis Date  . Anemia   . Anxiety   . Chronic diastolic CHF (congestive heart failure) ([place])   . Chronic kidney disease    stage IV. previous HD, none currently 10/30/15  . Depression    Chronic  . Diabetes mellitus    type 2, IDDM  . Diabetic peripheral neuropathy ([place])   . Dysrhythmia    tachycardia, normal ECHO 08-09-14  . GERD (gastroesophageal reflux disease)   . Hepatitis C 2016  . Hypertension   . Osteoarthritis of left knee   . Peripheral neuropathy ([place])   . Protein calorie malnutrition ([place])   . Septic arthritis ([place])   . Slow transit constipation   . Unsteady gait     Patient Active Problem List   Diagnosis Date Noted  . Chronic diastolic CHF (congestive heart failure) ([place]) 11/08/2015  . Uncontrolled type 2 diabetes mellitus  with complication ([place])   . Status post total left knee replacement   . Essential hypertension   . Acute blood loss anemia   . Diabetic peripheral neuropathy ([place])   . Anxiety state   . Depression   . Knee joint replacement status 11/07/2015  . Total knee replacement status 11/07/2015  . Normocytic anemia 11/07/2015  . Diabetes mellitus with complication ([place])   . Chronic kidney disease (CKD), stage IV (severe) ([place])   . Postoperative surgical complication involving circulatory system   . Physical debility 09/14/2014  . Acute renal failure syndrome ([place])   . Hep C w/ coma, chronic ([place])   . Joint infection ([employer])   . Acute renal failure ([place])   . Hepatitis C 08/16/2014  . SIRS (systemic inflammatory response syndrome) ([name])   . Benign essential HTN   . Septic joint of right hand ([place])   . Staphylococcus aureus bacteremia   . Septic joint of right knee joint ([place])   . ESRD (end stage renal disease) on dialysis ([place])   . Tachycardia 08/07/2014  . Septic joint of left shoulder region ([place]) 08/07/2014  . Sinus tachycardia ([place]) 08/07/2014  . BP (high blood pressure) 06/24/2013  . Disorder of peripheral nervous system ([place]) 06/24/2013  . 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 Diabetes mellitus, type 2 ([place]) 06/24/2013  . Uncontrolled hypertension 02/18/2013  . Type  II or unspecified type diabetes mellitus without mention of complication, uncontrolled 02/18/2013  . Chest pain at rest 10/18/2011    Class: Acute  . Shortness of breath 10/18/2011    Class: Acute    Past Surgical History:  Procedure Laterality Date  . APPENDECTOMY    . AV FISTULA PLACEMENT Left 09/13/2014   Procedure: Brachial Artery to Brachial Vein Gortex Four - Seven Stretch GRAFT INSERTION Left Forearm;  Surgeon: Mal Misty, MD;  Location: [facility];  Service: Vascular;  Laterality: Left;  . CHOLECYSTECTOMY    . KNEE ARTHROSCOPY Right 08/10/2014   Procedure: ARTHROSCOPY I & D KNEE;  Surgeon: Marianna Payment, MD;  Location: [facility];  Service: Orthopedics;  Laterality: Right;  . KNEE ARTHROSCOPY Left 08/11/2014   Procedure: ARTHROSCOPIC WASHOUT LEFT KNEE;  Surgeon: Marianna Payment, MD;  Location: [facility];  Service: Orthopedics;  Laterality: Left;  . KNEE ARTHROSCOPY Left 08/19/2014   Procedure: ARTHROSCOPIC WASHOUT LEFT KNEE;  Surgeon: Leandrew Koyanagi, MD;  Location: [facility];  Service: Orthopedics;  Laterality: Left;  . KNEE ARTHROSCOPY WITH LATERAL MENISECTOMY Left 04/04/2015   Procedure: AND PARTIAL LATERAL MENISECTOMY;  Surgeon: Leandrew Koyanagi, MD;  Location: [facility];  Service: Orthopedics;  Laterality: Left;  . KNEE ARTHROSCOPY WITH MEDIAL MENISECTOMY Left 04/04/2015   Procedure: LEFT KNEE ARTHROSCOPY WITH PARTIAL MEDIAL MENISCECTOMY  AND SYNOVECTOMY;  Surgeon: Leandrew Koyanagi, MD;  Location: [facility];  Service: Orthopedics;  Laterality: Left;  . SHOULDER ARTHROSCOPY Bilateral 08/10/2014   Procedure: I & D BILATERAL SHOULDERS ;  Surgeon: Marianna Payment, MD;  Location: [facility];  Service: Orthopedics;  Laterality: Bilateral;  . SMALL INTESTINE SURGERY     Due to Small Bowel Obstruction  . TEE WITHOUT CARDIOVERSION N/A 08/14/2014   Procedure: TRANSESOPHAGEAL ECHOCARDIOGRAM (TEE);  Surgeon: Thayer Headings, MD;  Location: [facility];  Service: Cardiovascular;  Laterality: N/A;  . TENOSYNOVECTOMY Right 08/11/2014   Procedure: RIGHT WRIST IRRIGATION AND DEBRIDEMENT, TENOSYNOVECTOMY;  Surgeon: Marianna Payment, MD;  Location: [facility];  Service: Orthopedics;  Laterality: Right;  . TOTAL KNEE ARTHROPLASTY Left 11/07/2015   Procedure: LEFT TOTAL KNEE ARTHROPLASTY WITH REVISION OF IMPLANTS;  Surgeon: Leandrew Koyanagi, MD;  Location: [facility];  Service: Orthopedics;  Laterality: Left;  . TUBAL LIGATION      OB History    No data available       Home Medications    Prior to Admission medications   Medication Sig Start Date End Date Taking? Authorizing Provider    acetaminophen (TYLENOL) 500 MG tablet Take 1,000 mg by mouth every 6 (six) hours as needed for mild pain.    Historical Provider, MD  aspirin 81 MG chewable tablet Chew 81 mg by mouth daily.     Historical Provider, MD  Blood Glucose Monitoring Suppl (ACCU-CHEK AVIVA PLUS) W/DEVICE KIT Check sugars TID for E11.65 01/29/15   Lance Bosch, NP  calcium carbonate (TUMS EX) 750 MG chewable tablet Chew 1 tablet by mouth daily.    Historical Provider, MD  diphenhydrAMINE (BENADRYL) 25 mg capsule Take 1 capsule (25 mg total) by mouth every 6 (six) hours as needed for itching. 09/28/14   Bary Leriche, PA-C  ferrous sulfate 325 (65 FE) MG tablet Take 325 mg by mouth 2 (two) times daily with a meal.    Historical Provider, MD  furosemide (LASIX) 80 MG tablet Take 160 mg by mouth 3 (three) times daily.  Historical Provider, MD  gabapentin (NEURONTIN) 100 MG capsule Take 1 capsule (100 mg total) by mouth 2 (two) times daily. 12/06/14   Lance Bosch, NP  glucose blood (ACCU-CHEK AVIVA) test strip Check sugars TID for E11.65 01/29/15   Lance Bosch, NP  hydrALAZINE (APRESOLINE) 100 MG tablet Take 1 tablet by mouth 2 (two) times daily. 10/17/15   Historical Provider, MD  insulin glargine (LANTUS) 100 UNIT/ML injection Inject 5 Units into the skin daily.    Historical Provider, MD  Insulin Syringe-Needle U-100 (BD INSULIN SYRINGE ULTRAFINE) 31G X 5/16" 0.5 ML MISC Inject insulin once per night for E11.65 01/29/15   Lance Bosch, NP  Lancet Devices [facility]) lancets Check sugars TID for E11.65 01/29/15   Lance Bosch, NP  methocarbamol (ROBAXIN) 750 MG tablet Take 1 tablet (750 mg total) by mouth 2 (two) times daily as needed for muscle spasms. 11/07/15   Leandrew Koyanagi, MD  metoprolol succinate (TOPROL-XL) 50 MG 24 hr tablet Take 1 tablet by mouth daily. 10/17/15   Historical Provider, MD  Multiple Vitamin (MULTIVITAMIN WITH MINERALS) TABS tablet Take 1 tablet by mouth daily.    Historical Provider, MD   nicotine (NICODERM CQ - DOSED IN MG/24 HOURS) 14 mg/24hr patch Place 14 mg onto the skin daily.    Historical Provider, MD  omeprazole (PRILOSEC) 20 MG capsule Take 1 capsule by mouth every morning.  10/04/15   Historical Provider, MD  ondansetron (ZOFRAN) 4 MG tablet Take 1-2 tablets (4-8 mg total) by mouth every 8 (eight) hours as needed for nausea or vomiting. 11/07/15   Leandrew Koyanagi, MD  oxyCODONE (OXY IR/ROXICODONE) 5 MG immediate release tablet Take 1-3 tablets (5-15 mg total) by mouth every 4 (four) hours as needed. 11/07/15   Leandrew Koyanagi, MD  oxyCODONE (OXYCONTIN) 10 mg 12 hr tablet Take 1 tablet (10 mg total) by mouth every 12 (twelve) hours. 11/07/15   Leandrew Koyanagi, MD  Potassium Chloride ER 20 MEQ TBCR Take 1 tablet by mouth daily. 10/04/15   Historical Provider, MD  sennosides-docusate sodium (SENOKOT-S) 8.6-50 MG tablet Take 2 tablets by mouth at bedtime.    Historical Provider, MD  sertraline (ZOLOFT) 100 MG tablet Take 1 tablet (100 mg total) by mouth daily. 03/15/15   Carlyle Basques, MD    Family History Family History  Problem Relation Age of Onset  . Cancer Mother   . Heart disease Father   . Cancer Sister   . Cancer Brother   . Diabetes Brother     Social History Social History  Substance Use Topics  . Smoking status: Current Every Day Smoker    Packs/day: 0.50    Years: 20.00    Types: Cigarettes  . Smokeless tobacco: Never Used     Comment: Smoking 7-8 cigs per day  . Alcohol use No     Comment: per pt no alcohol since 09/2014     Allergies   Compazine [prochlorperazine]; Shellfish-derived products; Iodinated diagnostic agents; and Omnipaque [iohexol]   Review of Systems Review of Systems  Constitutional: Positive for fatigue. Negative for chills and fever.  HENT: Positive for dental problem.   Neurological: Positive for dizziness.  All other systems reviewed and are negative.    Physical Exam Updated Vital Signs BP 155/77 (BP Location: Right Arm)    Pulse 85   Temp 98 F (36.7 C) (Oral)   Resp 18   LMP 03/16/2002   SpO2 98%  Physical Exam  Constitutional: She is oriented to person, place, and time. She appears well-developed and well-nourished.  HENT:  Head: Normocephalic and atraumatic.  Multiple upper and lower dental extraction sites. No trismus. Left upper premolar space appears to be actively bleeding.  Eyes: Conjunctivae and EOM are normal. Pupils are equal, round, and reactive to light.  Neck: Normal range of motion and phonation normal. Neck supple.  Cardiovascular: Normal rate.   Hypertensive  Pulmonary/Chest: Effort normal.  Neurological: She is alert and oriented to person, place, and time. She exhibits normal muscle tone.  Skin: Skin is warm and dry.  Psychiatric: She has a normal mood and affect. Her behavior is normal. Judgment and thought content normal.  Nursing note and vitals reviewed.    ED Treatments / Results  DIAGNOSTIC STUDIES: Oxygen Saturation is 97% on RA, normal by my interpretation.    COORDINATION OF CARE: 12:44 AM-Discussed treatment plan which includes labs with pt at bedside and pt agreed to plan.    Labs (all labs ordered are listed, but only abnormal results are displayed) Labs Reviewed  BASIC METABOLIC PANEL - Abnormal; Notable for the following:       Result Value   Potassium 3.1 (*)    Chloride 99 (*)    CO2 33 (*)    Glucose, Bld 117 (*)    BUN 34 (*)    Creatinine, Ser 2.23 (*)    Calcium 8.6 (*)    GFR calc non Af Amer 23 (*)    GFR calc Af Amer 26 (*)    All other components within normal limits  CBC WITH DIFFERENTIAL/PLATELET - Abnormal; Notable for the following:    RBC 3.68 (*)    Hemoglobin 10.4 (*)    HCT 32.5 (*)    RDW 19.2 (*)    All other components within normal limits  CBC WITH DIFFERENTIAL/PLATELET - Abnormal; Notable for the following:    RBC 3.09 (*)    Hemoglobin 8.8 (*)    HCT 27.4 (*)    RDW 19.4 (*)    All other components within normal limits      EKG  EKG Interpretation None       Radiology No results found.  Procedures Procedures (including critical care time)  Medications Ordered in ED Medications  sodium chloride 0.9 % bolus 500 mL (0 mLs Intravenous Stopped 01/27/16 0540)  THROMBI-PAD (THROMBI-PAD) 3"X3" pad 1 each (1 each Topical Given 01/27/16 0357)  THROMBI-PAD (THROMBI-PAD) 3"X3" pad 1 each (1 each Topical Given 01/27/16 0545)  oxyCODONE (Oxy IR/ROXICODONE) immediate release tablet 5 mg (5 mg Oral Given 01/27/16 0904)     Initial Impression / Assessment and Plan / ED Course  I have reviewed the triage vital signs and the nursing notes.  Pertinent labs & imaging results that were available during my care of the patient were reviewed by me and considered in my medical decision making (see chart for details).  Clinical Course    Medications  sodium chloride 0.9 % bolus 500 mL (0 mLs Intravenous Stopped 01/27/16 0540)  THROMBI-PAD (THROMBI-PAD) 3"X3" pad 1 each (1 each Topical Given 01/27/16 0357)  THROMBI-PAD (THROMBI-PAD) 3"X3" pad 1 each (1 each Topical Given 01/27/16 0545)  oxyCODONE (Oxy IR/ROXICODONE) immediate release tablet 5 mg (5 mg Oral Given 01/27/16 0904)   BP 155/77 (BP Location: Right Arm)   Pulse 85   Temp 98 F (36.7 C) (Oral)   Resp 18   LMP 03/16/2002   SpO2 98%  No data found.   07:30 AM Reevaluation with update and discussion. After initial assessment and treatment, an updated evaluation reveals she is alerta and comfortable. Blood loss captured with suction is around 400 cc. Vitals are stable. She is not currently on Dialysis. 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 Aki Abalos L   Attempt to control continued bleeding with sutures, by PA.   Final Clinical Impressions(s) / ED Diagnoses   Final diagnoses:  S/P tooth extraction, unspecified edentulism  Postoperative anemia due to acute blood loss    Wound bleeding, open socket. Moderate blood loss with stable BP. Patient will likely  need a short term Iron infusion. Bleeding improved, but continues some. If it can be controlled, she will be stable for OP follow up with oral surgery.  Nursing Notes Reviewed/ Care Coordinated Applicable Imaging Reviewed Interpretation of Laboratory Data incorporated into ED treatment  Plan- Observe in ED for stability. Check another Hb at 4 hours.  I personally performed the services described in this documentation, which was scribed in my presence. The recorded information has been reviewed and is accurate.    New Prescriptions Discharge Medication List as of 01/27/2016  9:49 AM       Daleen Bo, MD 01/29/16 1055

## 2016-01-30 ENCOUNTER — Ambulatory Visit: Payer: Medicare Other | Admitting: Physical Therapy

## 2016-01-30 DIAGNOSIS — M25672 Stiffness of left ankle, not elsewhere classified: Secondary | ICD-10-CM

## 2016-01-30 DIAGNOSIS — M25562 Pain in left knee: Secondary | ICD-10-CM | POA: Diagnosis not present

## 2016-01-30 DIAGNOSIS — R262 Difficulty in walking, not elsewhere classified: Secondary | ICD-10-CM

## 2016-01-30 DIAGNOSIS — R609 Edema, unspecified: Secondary | ICD-10-CM | POA: Diagnosis not present

## 2016-01-30 DIAGNOSIS — M6281 Muscle weakness (generalized): Secondary | ICD-10-CM

## 2016-01-30 DIAGNOSIS — R600 Localized edema: Secondary | ICD-10-CM

## 2016-01-30 NOTE — Therapy (Signed)
[place], [place], [address] Phone: [phone]   Fax:  [phone]  Physical Therapy Treatment  Patient Details  Name: NICKITA KUENZI MRN: [id number] Date of Birth: 1955-10-10 Referring Provider: Dr. [name]   Encounter Date: 01/30/2016      PT End of Session - 01/30/16 1755    Visit Number 12   Number of Visits 21   Date for PT Re-Evaluation 02/29/16   PT Start Time [id number]   PT Stop Time 1633   PT Time Calculation (min) 44 min   Activity Tolerance Patient limited by fatigue   Behavior During Therapy [facility] for tasks assessed/performed      Past Medical History:  Diagnosis Date  . Anemia   . Anxiety   . Chronic diastolic CHF (congestive heart failure) ([place])   . Chronic kidney disease    stage IV. previous HD, none currently 10/30/15  . Depression    Chronic  . Diabetes mellitus    type 2, IDDM  . Diabetic peripheral neuropathy ([place])   . Dysrhythmia    tachycardia, normal ECHO 08-09-14  . GERD (gastroesophageal reflux disease)   . Hepatitis C 2016  . Hypertension   . Osteoarthritis of left knee   . Peripheral neuropathy ([place])   . Protein calorie malnutrition ([place])   . Septic arthritis ([place])   . Slow transit constipation   . Unsteady gait     Past Surgical History:  Procedure Laterality Date  . APPENDECTOMY    . AV FISTULA PLACEMENT Left 09/13/2014   Procedure: Brachial Artery to Brachial Vein Gortex Four - Seven Stretch GRAFT INSERTION Left Forearm;  Surgeon: Mal Misty, MD;  Location: [place];  Service: Vascular;  Laterality: Left;  . CHOLECYSTECTOMY    . KNEE ARTHROSCOPY Right 08/10/2014   Procedure: ARTHROSCOPY I & D KNEE;  Surgeon: Marianna Payment, MD;  [facility];  Service: Orthopedics;  Laterality: Right;  . KNEE ARTHROSCOPY Left 08/11/2014   Procedure: ARTHROSCOPIC WASHOUT LEFT KNEE;  Surgeon: Marianna Payment, MD;  Location: [place];  Service: Orthopedics;  Laterality: Left;  .  KNEE ARTHROSCOPY Left 08/19/2014   Procedure: ARTHROSCOPIC WASHOUT LEFT KNEE;  Surgeon: Leandrew Koyanagi, MD;  Location: [place];  Service: Orthopedics;  Laterality: Left;  . KNEE ARTHROSCOPY WITH LATERAL MENISECTOMY Left 04/04/2015   Procedure: AND PARTIAL LATERAL MENISECTOMY;  Surgeon: Leandrew Koyanagi, MD;  Location: [place];  Service: Orthopedics;  Laterality: Left;  . KNEE ARTHROSCOPY WITH MEDIAL MENISECTOMY Left 04/04/2015   Procedure: LEFT KNEE ARTHROSCOPY WITH PARTIAL MEDIAL MENISCECTOMY  AND SYNOVECTOMY;  Surgeon: Leandrew Koyanagi, MD;  Location: [name];  Service: Orthopedics;  Laterality: Left;  . SHOULDER ARTHROSCOPY Bilateral 08/10/2014   Procedure: I & D BILATERAL SHOULDERS ;  Surgeon: Marianna Payment, MD;  [facility];  Service: Orthopedics;  Laterality: Bilateral;  . SMALL INTESTINE SURGERY     Due to Small Bowel Obstruction  . TEE WITHOUT CARDIOVERSION N/A 08/14/2014   Procedure: TRANSESOPHAGEAL ECHOCARDIOGRAM (TEE);  Surgeon: Thayer Headings, MD;  Location: [place];  Service: Cardiovascular;  Laterality: N/A;  . TENOSYNOVECTOMY Right 08/11/2014   Procedure: RIGHT WRIST IRRIGATION AND DEBRIDEMENT, TENOSYNOVECTOMY;  Surgeon: Marianna Payment, MD;  Location: [place];  Service: Orthopedics;  Laterality: Right;  . TOTAL KNEE ARTHROPLASTY Left 11/07/2015   Procedure: LEFT TOTAL KNEE ARTHROPLASTY WITH REVISION OF IMPLANTS;  Surgeon: Leandrew Koyanagi, MD;  Location: [facility];  Service: Orthopedics;  Laterality: Left;  . TUBAL LIGATION      There were no vitals filed for this visit.      Subjective Assessment - 01/30/16 1750    Subjective I lost 2 pints of blood, I had a scab come off my gum where tooth pulled was near a vessel and I bled out.  They tried to stich it , but it did not work,  I still am on antibiotics.  I'm stiff today.  NO knee pain.  No ankle pain.   Currently in Pain? No/denies  has continued face pain from getting her teeth pulled.    Pain  Score 0-No pain   Pain Location Knee   Pain Orientation Left;Right   Pain Frequency Intermittent            [facility] PT Assessment - 01/30/16 0001      Circumferential Edema   Circumferential - Left  18 3/4  Whole body puffy other issues.     AROM   Left Knee Extension 0   Left Knee Flexion 100  measured prior to stretching     Strength   Left Hip Flexion 5/5   Left Hip ABduction --  4+/5     Berg Balance Test   Sit to Stand Able to stand without using hands and stabilize independently   Standing Unsupported Able to stand safely 2 minutes   Sitting with Back Unsupported but Feet Supported on Floor or Stool Able to sit safely and securely 2 minutes   Stand to Sit Sits safely with minimal use of hands   Transfers Able to transfer safely, definite need of hands   Standing Unsupported with Eyes Closed Needs help to keep from falling   Standing Ubsupported with Feet Together Able to place feet together independently and stand for 1 minute with supervision   From Standing, Reach Forward with Outstretched Arm Can reach confidently >25 cm (10")   From Standing Position, Pick up Object from Floor Able to pick up shoe safely and easily   From Standing Position, Turn to Look Behind Over each Shoulder Looks behind one side only/other side shows less weight shift   Turn 360 Degrees Able to turn 360 degrees safely in 4 seconds or less   Standing Unsupported, Alternately Place Feet on Step/Stool Able to complete 4 steps without aid or supervision   Standing Unsupported, One Foot in Front Needs help to step but can hold 15 seconds   Standing on One Leg Tries to lift leg/unable to hold 3 seconds but remains standing independently   Total Score 41                     [facility] Adult PT Treatment/Exercise - 01/30/16 0001      Knee/Hip Exercises: Aerobic   Nustep L4, 6 minutes     Manual Therapy   Manual therapy comments retrograde for edema knee      LAQ 10 x  sitting            PT Short Term Goals - 01/30/16 1803      PT SHORT TERM GOAL #1   Title Pt will be able to be I with HEP for knee AROM/AAROM    Time 4   Period Weeks   Status On-going     PT SHORT TERM GOAL #2   Title Pt will be able to bend knee in sitting to 95 deg for improved transfers   Status  Achieved     PT SHORT TERM GOAL #3   Title Pt will be able to understand RICE, edema mgmt    Time 4   Period Weeks   Status Achieved           PT Long Term Goals - 01/21/16 1705      PT LONG TERM GOAL #1   Title Pt will be able to be I with more advanced HEP    Time 8   Period Weeks   Status On-going     PT LONG TERM GOAL #2   Title Pt will score FOTO less than 48% limited   Time 8   Period Weeks   Status Unable to assess     PT LONG TERM GOAL #3   Title Pt will be able to walk with LRAD in community as needed with min increase in knee pain.    Time 8   Period Weeks   Status On-going     PT LONG TERM GOAL #4   Title Pt will demo knee AROM flexion to 110 deg for improved transfers.    Time 8   Period Weeks   Status Unable to assess     PT LONG TERM GOAL #5   Title Pt will demo 4+/5 in L knee for improved stability with gait, function with stairs.    Time 8   Period Weeks   Status Unable to assess     PT LONG TERM GOAL #6   Title Balance goal 44/56.    Time 8   Period Weeks   Status Unable to assess               Plan - 01/30/16 1756    Clinical Impression Statement BERG improved from 38/56 to 41/56.  She does not feel well due to other medical conditions. Progress toward balance goal.     PT Next Visit Plan   Continue knee and ankle exercises.     Consulted and Agree with Plan of Care Patient      Patient will benefit from skilled therapeutic intervention in order to improve the following deficits and impairments:  Abnormal gait, Decreased activity tolerance, Decreased balance, Decreased mobility, Decreased strength, Increased edema,  Impaired sensation, Postural dysfunction, Impaired flexibility, Hypomobility, Decreased scar mobility, Decreased skin integrity, Pain, Decreased range of motion, Difficulty walking, Increased fascial restricitons  Visit Diagnosis: Localized edema  Pain in left knee  Difficulty in walking, not elsewhere classified  Edema, unspecified  Stiffness of left ankle, not elsewhere classified  Muscle weakness (generalized)     Problem List Patient Active Problem List   Diagnosis Date Noted  . Chronic diastolic CHF (congestive heart failure) ([place]) 11/08/2015  . Uncontrolled type 2 diabetes mellitus with complication ([place])   . Status post total left knee replacement   . Essential hypertension   . Acute blood loss anemia   . Diabetic peripheral neuropathy ([place])   . Anxiety state   . Depression   . Knee joint replacement status 11/07/2015  . Total knee replacement status 11/07/2015  . Normocytic anemia 11/07/2015  . Diabetes mellitus with complication ([place])   . Chronic kidney disease (CKD), stage IV (severe) ([place])   . Postoperative surgical complication involving circulatory system   . Physical debility 09/14/2014  . Acute renal failure syndrome ([place])   . Hep C w/ coma, chronic ([place])   . Joint infection ([place])   . Acute renal failure ([place])   . Hepatitis C 08/16/2014  .  SIRS (systemic inflammatory response syndrome) ([name])   . Benign essential HTN   . Septic joint of right hand ([place])   . Staphylococcus aureus bacteremia   . Septic joint of right knee joint ([place])   . ESRD (end stage renal disease) on dialysis ([facility])   . Tachycardia 08/07/2014  . Septic joint of left shoulder region ([place]) 08/07/2014  . Sinus tachycardia ([place]) 08/07/2014  . BP (high blood pressure) 06/24/2013  . Disorder of peripheral nervous system ([place]) 06/24/2013  . 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 Diabetes mellitus, type 2 ([place]) 06/24/2013  . Uncontrolled hypertension 02/18/2013  . Type II or  unspecified type diabetes mellitus without mention of complication, uncontrolled 02/18/2013  . Chest pain at rest 10/18/2011    Class: Acute  . Shortness of breath 10/18/2011    Class: Acute    Cashus Halterman 01/30/2016, 6:06 PM  [facility] [address] [place], [place], [address] Phone: [phone]   Fax:  [phone]  Name: TYIA MCGRAIN MRN: [id number] Date of Birth: 10-06-1955   Melvenia Needles, PTA 01/30/16 6:06 PM Phone: [phone] Fax: [phone]

## 2016-02-06 ENCOUNTER — Ambulatory Visit: Payer: Medicare Other | Admitting: Physical Therapy

## 2016-02-06 ENCOUNTER — Encounter (HOSPITAL_COMMUNITY)
Admission: RE | Admit: 2016-02-06 | Discharge: 2016-02-06 | Disposition: A | Discharge status: Home / Self Care | Payer: Medicare Other | Source: Ambulatory Visit | Attending: Nephrology | Admitting: Nephrology

## 2016-02-06 DIAGNOSIS — R262 Difficulty in walking, not elsewhere classified: Secondary | ICD-10-CM

## 2016-02-06 DIAGNOSIS — M6281 Muscle weakness (generalized): Secondary | ICD-10-CM

## 2016-02-06 DIAGNOSIS — D638 Anemia in other chronic diseases classified elsewhere: Secondary | ICD-10-CM | POA: Insufficient documentation

## 2016-02-06 DIAGNOSIS — N184 Chronic kidney disease, stage 4 (severe): Secondary | ICD-10-CM | POA: Insufficient documentation

## 2016-02-06 DIAGNOSIS — R609 Edema, unspecified: Secondary | ICD-10-CM

## 2016-02-06 DIAGNOSIS — M25672 Stiffness of left ankle, not elsewhere classified: Secondary | ICD-10-CM

## 2016-02-06 DIAGNOSIS — R600 Localized edema: Secondary | ICD-10-CM | POA: Diagnosis not present

## 2016-02-06 DIAGNOSIS — M25562 Pain in left knee: Secondary | ICD-10-CM | POA: Diagnosis not present

## 2016-02-06 LAB — IRON AND TIBC
Iron: 50 ug/dL (ref 28–170)
Saturation Ratios: 17 % (ref 10.4–31.8)
TIBC: 290 ug/dL (ref 250–450)
UIBC: 240 ug/dL

## 2016-02-06 LAB — FERRITIN: Ferritin: 563 ng/mL — ABNORMAL HIGH (ref 11–307)

## 2016-02-06 LAB — POCT HEMOGLOBIN-HEMACUE: Hemoglobin: 9.5 g/dL — ABNORMAL LOW (ref 12.0–15.0)

## 2016-02-06 MED ORDER — DARBEPOETIN ALFA 200 MCG/0.4ML IJ SOSY
PREFILLED_SYRINGE | INTRAMUSCULAR | Status: AC
  Filled 2016-02-06: qty 0.4

## 2016-02-06 MED ORDER — DARBEPOETIN ALFA 200 MCG/0.4ML IJ SOSY
200.0000 ug | PREFILLED_SYRINGE | INTRAMUSCULAR | Status: DC
  Administered 2016-02-06: 200 ug via SUBCUTANEOUS

## 2016-02-06 NOTE — Therapy (Signed)
[place], [place], [address] Phone: [phone]   Fax:  [phone]  Physical Therapy Treatment  Patient Details  Name: Brandy Houston MRN: [id number] Date of Birth: 1956/02/01 Referring Provider: Dr. [name]   Encounter Date: 02/06/2016    Past Medical History:  Diagnosis Date  . Anemia   . Anxiety   . Chronic diastolic CHF (congestive heart failure) ([place])   . Chronic kidney disease    stage IV. previous HD, none currently 10/30/15  . Depression    Chronic  . Diabetes mellitus    type 2, IDDM  . Diabetic peripheral neuropathy ([place])   . Dysrhythmia    tachycardia, normal ECHO 08-09-14  . GERD (gastroesophageal reflux disease)   . Hepatitis C 2016  . Hypertension   . Osteoarthritis of left knee   . Peripheral neuropathy ([place])   . Protein calorie malnutrition ([place])   . Septic arthritis ([place])   . Slow transit constipation   . Unsteady gait     Past Surgical History:  Procedure Laterality Date  . APPENDECTOMY    . AV FISTULA PLACEMENT Left 09/13/2014   Procedure: Brachial Artery to Brachial Vein Gortex Four - Seven Stretch GRAFT INSERTION Left Forearm;  Surgeon: Mal Misty, MD;  Location: [place];  Service: Vascular;  Laterality: Left;  . CHOLECYSTECTOMY    . KNEE ARTHROSCOPY Right 08/10/2014   Procedure: ARTHROSCOPY I & D KNEE;  Surgeon: Marianna Payment, MD;  [facility];  Service: Orthopedics;  Laterality: Right;  . KNEE ARTHROSCOPY Left 08/11/2014   Procedure: ARTHROSCOPIC WASHOUT LEFT KNEE;  Surgeon: Marianna Payment, MD;  Location: [place];  Service: Orthopedics;  Laterality: Left;  . KNEE ARTHROSCOPY Left 08/19/2014   Procedure: ARTHROSCOPIC WASHOUT LEFT KNEE;  Surgeon: Leandrew Koyanagi, MD;  Location: [place];  Service: Orthopedics;  Laterality: Left;  . KNEE ARTHROSCOPY WITH LATERAL MENISECTOMY Left 04/04/2015   Procedure: AND PARTIAL LATERAL MENISECTOMY;  Surgeon: Leandrew Koyanagi, MD;  Location:  [place];  Service: Orthopedics;  Laterality: Left;  . KNEE ARTHROSCOPY WITH MEDIAL MENISECTOMY Left 04/04/2015   Procedure: LEFT KNEE ARTHROSCOPY WITH PARTIAL MEDIAL MENISCECTOMY  AND SYNOVECTOMY;  Surgeon: Leandrew Koyanagi, MD;  Location: [place];  Service: Orthopedics;  Laterality: Left;  . SHOULDER ARTHROSCOPY Bilateral 08/10/2014   Procedure: I & D BILATERAL SHOULDERS ;  Surgeon: Marianna Payment, MD;  [facility];  Service: Orthopedics;  Laterality: Bilateral;  . SMALL INTESTINE SURGERY     Due to Small Bowel Obstruction  . TEE WITHOUT CARDIOVERSION N/A 08/14/2014   Procedure: TRANSESOPHAGEAL ECHOCARDIOGRAM (TEE);  Surgeon: Thayer Headings, MD;  Location: [place];  Service: Cardiovascular;  Laterality: N/A;  . TENOSYNOVECTOMY Right 08/11/2014   Procedure: RIGHT WRIST IRRIGATION AND DEBRIDEMENT, TENOSYNOVECTOMY;  Surgeon: Marianna Payment, MD;  Location: [place];  Service: Orthopedics;  Laterality: Right;  . TOTAL KNEE ARTHROPLASTY Left 11/07/2015   Procedure: LEFT TOTAL KNEE ARTHROPLASTY WITH REVISION OF IMPLANTS;  Surgeon: Leandrew Koyanagi, MD;  Location: [place];  Service: Orthopedics;  Laterality: Left;  . TUBAL LIGATION      There were no vitals filed for this visit.      Subjective Assessment - 02/06/16 1551    Subjective No pain except when she has a muscle spasm. Knee.  My ankle does not hurt me anymore She has 10 to 15 spasms a day.  My knee swells every time  I get up on it.   I an a little short of breath today due to my fluid.  My stomach and my other leg  are swollen too   Currently in Pain? No/denies   Pain Score 0-No pain   Pain Location Knee   Pain Orientation Left   Pain Descriptors / Indicators Throbbing;Aching   Pain Radiating Towards shin (Right)   Pain Frequency Intermittent   Aggravating Factors  walking   Pain Relieving Factors rest   Pain Score 0   Pain Location Ankle   Pain Orientation Left   Pain Descriptors / Indicators  Aching  diabetic nreve pain   Pain Frequency Occasional   Aggravating Factors  at night   Pain Relieving Factors rolling foot over tennin ball            [facility] PT Assessment - 02/06/16 0001      AROM   Left Knee Flexion 104     Strength   Left Knee Flexion 4/5   Left Knee Extension 4+/5                     [facility] Adult PT Treatment/Exercise - 02/06/16 0001      Knee/Hip Exercises: Standing   Wall Squat 10 reps     Knee/Hip Exercises: Sidelying   Clams 10 X 1 , 5 X 1                  PT Short Term Goals - 02/06/16 1620      PT SHORT TERM GOAL #1   Title Pt will be able to be I with HEP for knee AROM/AAROM    Baseline Exercises 3 X a day.  She has hre program memorized   Time 4   Period Weeks   Status Achieved     PT SHORT TERM GOAL #2   Time 4   Period Weeks   Status Achieved     PT SHORT TERM GOAL #3   Title Pt will be able to understand RICE, edema mgmt    Baseline understands   Time 4   Period Weeks   Status Achieved           PT Long Term Goals - 02/06/16 1624      PT LONG TERM GOAL #1   Title Pt will be able to be I with more advanced HEP    Baseline independent with exercises so far.     Time 8   Period Weeks   Status On-going     PT LONG TERM GOAL #2   Title Pt will score FOTO less than 48% limited   Baseline initial not done.  LEFS issued will return with results next visit   Time 8   Period Weeks   Status Unable to assess     PT LONG TERM GOAL #3   Title Pt will be able to walk with LRAD in community as needed with min increase in knee pain.    Baseline Uses rollator in community,  uses electric cart in grocery store.  Pain increases with walking 5/10.  Sometimes shops short distances with the cart.   Time 8   Period Weeks   Status On-going     PT LONG TERM GOAL #4   Title Pt will demo knee AROM flexion to 110 deg for improved transfers.    Baseline 104,  AAOM 115   Time 8   Period Weeks   Status  On-going  PT LONG TERM GOAL #5   Title Pt will demo 4+/5 in L knee for improved stability with gait, function with stairs.    Baseline 4/5 hamstrings,  4+/5 quads   Time 8   Period Weeks   Status Partially Met     PT LONG TERM GOAL #6   Title Balance goal 44/56.    Time 8   Period Weeks   Status Unable to assess               Plan - 02/06/16 1741    Clinical Impression Statement AROM 104,  AA 115.  Strength quads 4+/5, hamstrings 4/5.  Patient continues to use her rollator, motorized cart and pushing a basket in the community.   AT home she does not use the rollator.  Lately she has been dealing with other medical issues.  Today her legs and abdomin were swollen and congested.  She is making good progress toward her other goals. Ankle pain is occasonal and it is helped by rolling foot over a tennis ball.    PT Next Visit Plan continue strength, ROM and balance.   see what MD says.    PT Home Exercise Plan contine   Consulted and Agree with Plan of Care Patient      Patient will benefit from skilled therapeutic intervention in order to improve the following deficits and impairments:  Abnormal gait, Decreased activity tolerance, Decreased balance, Decreased mobility, Decreased strength, Increased edema, Impaired sensation, Postural dysfunction, Impaired flexibility, Hypomobility, Decreased scar mobility, Decreased skin integrity, Pain, Decreased range of motion, Difficulty walking, Increased fascial restricitons  Visit Diagnosis: Localized edema  Pain in left knee  Difficulty in walking, not elsewhere classified  Edema, unspecified  Stiffness of left ankle, not elsewhere classified  Muscle weakness (generalized)     Problem List Patient Active Problem List   Diagnosis Date Noted  . Chronic diastolic CHF (congestive heart failure) ([place]) 11/08/2015  . Uncontrolled type 2 diabetes mellitus with complication ([place])   . Status post total left knee replacement   .  Essential hypertension   . Acute blood loss anemia   . Diabetic peripheral neuropathy ([place])   . Anxiety state   . Depression   . Knee joint replacement status 11/07/2015  . Total knee replacement status 11/07/2015  . Normocytic anemia 11/07/2015  . Diabetes mellitus with complication ([place])   . Chronic kidney disease (CKD), stage IV (severe) ([place])   . Postoperative surgical complication involving circulatory system   . Physical debility 09/14/2014  . Acute renal failure syndrome ([place])   . Hep C w/ coma, chronic ([place])   . Joint infection ([place])   . Acute renal failure ([place])   . Hepatitis C 08/16/2014  . SIRS (systemic inflammatory response syndrome) ([name])   . Benign essential HTN   . Septic joint of right hand ([place])   . Staphylococcus aureus bacteremia   . Septic joint of right knee joint ([place])   . ESRD (end stage renal disease) on dialysis ([place])   . Tachycardia 08/07/2014  . Septic joint of left shoulder region ([place]) 08/07/2014  . Sinus tachycardia ([place]) 08/07/2014  . BP (high blood pressure) 06/24/2013  . Disorder of peripheral nervous system ([place]) 06/24/2013  . 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 Diabetes mellitus, type 2 ([place]) 06/24/2013  . Uncontrolled hypertension 02/18/2013  . Type II or unspecified type diabetes mellitus without mention of complication, uncontrolled 02/18/2013  . Chest pain at rest 10/18/2011  Class: Acute  . Shortness of breath 10/18/2011    Class: Acute    HARRIS,KAREN 02/06/2016, 5:47 PM  [facility] [address] [place], [place], [address] Phone: [phone]   Fax:  [phone]  Name: CLEON THOMA MRN: [id number] Date of Birth: 07-08-1955   Melvenia Needles, PTA 02/06/16 5:47 PM Phone: [phone] Fax: [phone]

## 2016-02-08 DIAGNOSIS — M25572 Pain in left ankle and joints of left foot: Secondary | ICD-10-CM | POA: Diagnosis not present

## 2016-02-11 ENCOUNTER — Ambulatory Visit: Payer: Medicare Other | Admitting: Physical Therapy

## 2016-02-11 DIAGNOSIS — M25562 Pain in left knee: Secondary | ICD-10-CM | POA: Diagnosis not present

## 2016-02-11 DIAGNOSIS — M6281 Muscle weakness (generalized): Secondary | ICD-10-CM

## 2016-02-11 DIAGNOSIS — R600 Localized edema: Secondary | ICD-10-CM

## 2016-02-11 DIAGNOSIS — R609 Edema, unspecified: Secondary | ICD-10-CM | POA: Diagnosis not present

## 2016-02-11 DIAGNOSIS — M25672 Stiffness of left ankle, not elsewhere classified: Secondary | ICD-10-CM | POA: Diagnosis not present

## 2016-02-11 DIAGNOSIS — R262 Difficulty in walking, not elsewhere classified: Secondary | ICD-10-CM | POA: Diagnosis not present

## 2016-02-11 NOTE — Therapy (Signed)
[place], [place], [address] Phone: [phone]   Fax:  [phone]  Physical Therapy Treatment  Patient Details  Name: Brandy Houston MRN: [id number] Date of Birth: 11-Aug-1955 Referring Provider: Dr. [name]   Encounter Date: 02/11/2016      PT End of Session - 02/11/16 1739    Visit Number 13   Number of Visits 21   Date for PT Re-Evaluation 02/29/16   PT Start Time 9417  Short session patient late   PT Stop Time 1630   PT Time Calculation (min) 33 min   Equipment Utilized During Treatment Gait belt   Activity Tolerance Patient tolerated treatment well   Behavior During Therapy [facility] for tasks assessed/performed      Past Medical History:  Diagnosis Date  . Anemia   . Anxiety   . Chronic diastolic CHF (congestive heart failure) ([place])   . Chronic kidney disease    stage IV. previous HD, none currently 10/30/15  . Depression    Chronic  . Diabetes mellitus    type 2, IDDM  . Diabetic peripheral neuropathy ([place])   . Dysrhythmia    tachycardia, normal ECHO 08-09-14  . GERD (gastroesophageal reflux disease)   . Hepatitis C 2016  . Hypertension   . Osteoarthritis of left knee   . Peripheral neuropathy ([place])   . Protein calorie malnutrition ([place])   . Septic arthritis ([place])   . Slow transit constipation   . Unsteady gait     Past Surgical History:  Procedure Laterality Date  . APPENDECTOMY    . AV FISTULA PLACEMENT Left 09/13/2014   Procedure: Brachial Artery to Brachial Vein Gortex Four - Seven Stretch GRAFT INSERTION Left Forearm;  Surgeon: Mal Misty, MD;  Location: [place];  Service: Vascular;  Laterality: Left;  . CHOLECYSTECTOMY    . KNEE ARTHROSCOPY Right 08/10/2014   Procedure: ARTHROSCOPY I & D KNEE;  Surgeon: Marianna Payment, MD;  [facility];  Service: Orthopedics;  Laterality: Right;  . KNEE ARTHROSCOPY Left 08/11/2014   Procedure: ARTHROSCOPIC WASHOUT LEFT KNEE;  Surgeon:  Marianna Payment, MD;  Location: [place];  Service: Orthopedics;  Laterality: Left;  . KNEE ARTHROSCOPY Left 08/19/2014   Procedure: ARTHROSCOPIC WASHOUT LEFT KNEE;  Surgeon: Leandrew Koyanagi, MD;  Location: [place];  Service: Orthopedics;  Laterality: Left;  . KNEE ARTHROSCOPY WITH LATERAL MENISECTOMY Left 04/04/2015   Procedure: AND PARTIAL LATERAL MENISECTOMY;  Surgeon: Leandrew Koyanagi, MD;  Location: [place];  Service: Orthopedics;  Laterality: Left;  . KNEE ARTHROSCOPY WITH MEDIAL MENISECTOMY Left 04/04/2015   Procedure: LEFT KNEE ARTHROSCOPY WITH PARTIAL MEDIAL MENISCECTOMY  AND SYNOVECTOMY;  Surgeon: Leandrew Koyanagi, MD;  Location: [place];  Service: Orthopedics;  Laterality: Left;  . SHOULDER ARTHROSCOPY Bilateral 08/10/2014   Procedure: I & D BILATERAL SHOULDERS ;  Surgeon: Marianna Payment, MD;  [facility];  Service: Orthopedics;  Laterality: Bilateral;  . SMALL INTESTINE SURGERY     Due to Small Bowel Obstruction  . TEE WITHOUT CARDIOVERSION N/A 08/14/2014   Procedure: TRANSESOPHAGEAL ECHOCARDIOGRAM (TEE);  Surgeon: Thayer Headings, MD;  Location: [place];  Service: Cardiovascular;  Laterality: N/A;  . TENOSYNOVECTOMY Right 08/11/2014   Procedure: RIGHT WRIST IRRIGATION AND DEBRIDEMENT, TENOSYNOVECTOMY;  Surgeon: Marianna Payment, MD;  Location: [place];  Service: Orthopedics;  Laterality: Right;  . TOTAL KNEE ARTHROPLASTY Left 11/07/2015   Procedure: LEFT TOTAL KNEE ARTHROPLASTY  WITH REVISION OF IMPLANTS;  Surgeon: Leandrew Koyanagi, MD;  Location: [place];  Service: Orthopedics;  Laterality: Left;  . TUBAL LIGATION      There were no vitals filed for this visit.      Subjective Assessment - 02/11/16 1559    Subjective 4/10  Knee.  Saw MD.  He said   Currently in Pain? Yes   Pain Score 4    Pain Location Knee   Pain Orientation Left   Pain Descriptors / Indicators Spasm   Pain Radiating Towards shin right            [facility] PT Assessment -  02/11/16 0001      Observation/Other Assessments   Lower Extremity Functional Scale  26/80     AROM   Left Knee Flexion 110  sitting                     [facility] Adult PT Treatment/Exercise - 02/11/16 0001      High Level Balance   High Level Balance Comments stepping over foam rollers with gait bely,  intermittant CGA for safety foeward and   lateral steps.      Knee/Hip Exercises: Stretches   Passive Hamstring Stretch 1 rep;20 seconds   single leg   Quad Stretch --  10 X 2 seconds.  Cued to hold it longer like 10 seconds.     Quad Stretch Limitations Patient rushed through these     Knee/Hip Exercises: Aerobic   Nustep L5 5 minutes, legs only     Knee/Hip Exercises: Standing   Forward Step Up Both;1 set;10 reps;Hand Hold: 0;Step Height: 4"   Forward Step Up Limitations intermittant hands needed for balance.  Cued to not use hands.  Balance improved with repetition     Knee/Hip Exercises: Seated   Heel Slides 1 set;10 reps  sliding pillow case   Hamstring Curl 1 set;10 reps   Hamstring Limitations Green band                  PT Short Term Goals - 02/11/16 1744      PT SHORT TERM GOAL #1   Title Pt will be able to be I with HEP for knee AROM/AAROM    Time 4   Period Weeks   Status Achieved     PT SHORT TERM GOAL #2   Title Pt will be able to bend knee in sitting to 95 deg for improved transfers   Period Weeks   Status Achieved     PT SHORT TERM GOAL #3   Title Pt will be able to understand RICE, edema mgmt    Baseline understands   Time 4   Period Weeks   Status Achieved           PT Long Term Goals - 02/11/16 1745      PT LONG TERM GOAL #1   Title Pt will be able to be I with more advanced HEP    Baseline independent with exercises so far.     Time 8   Period Weeks   Status On-going     PT LONG TERM GOAL #2   Title Pt will score FOTO less than 48% limited   Baseline LEFS 26/80   Time 8   Period Weeks   Status On-going      PT LONG TERM GOAL #3   Title Pt will be able to walk with LRAD in community as needed with  min increase in knee pain.    Baseline Uses rollator in community,  uses electric cart in grocery store.  Pain increases with walking 5/10.  Sometimes shops short distances with the cart.   Time 8   Period Weeks   Status On-going     PT LONG TERM GOAL #4   Title Pt will demo knee AROM flexion to 110 deg for improved transfers.    Baseline 110 sitting AROM ,  consistant?   Time 8   Period Weeks   Status Partially Met     PT LONG TERM GOAL #5   Title Pt will demo 4+/5 in L knee for improved stability with gait, function with stairs.    Time 8   Period Weeks   Status Unable to assess     PT LONG TERM GOAL #6   Title 8   Time 8   Period Weeks   Status Unable to assess               Plan - 02/11/16 1741    Clinical Impression Statement Flexion 110 sitting today.  LEFS 26/80 .  Progress toward ROM goals.  Pain 4/10 at end of session.  Patient said her MD wanted her to get more flexion and she should continue PT.     PT Next Visit Plan continue strength, ROM and balance.      PT Home Exercise Plan continue home exercises.  Must see a PT every 30 days.   Consulted and Agree with Plan of Care Patient      Patient will benefit from skilled therapeutic intervention in order to improve the following deficits and impairments:  Abnormal gait, Decreased activity tolerance, Decreased balance, Decreased mobility, Decreased strength, Increased edema, Impaired sensation, Postural dysfunction, Impaired flexibility, Hypomobility, Decreased scar mobility, Decreased skin integrity, Pain, Decreased range of motion, Difficulty walking, Increased fascial restricitons  Visit Diagnosis: Localized edema  Pain in left knee  Difficulty in walking, not elsewhere classified  Muscle weakness (generalized)     Problem List Patient Active Problem List   Diagnosis Date Noted  . Chronic diastolic CHF  (congestive heart failure) ([place]) 11/08/2015  . Uncontrolled type 2 diabetes mellitus with complication ([place])   . Status post total left knee replacement   . Essential hypertension   . Acute blood loss anemia   . Diabetic peripheral neuropathy ([place])   . Anxiety state   . Depression   . Knee joint replacement status 11/07/2015  . Total knee replacement status 11/07/2015  . Normocytic anemia 11/07/2015  . Diabetes mellitus with complication ([place])   . Chronic kidney disease (CKD), stage IV (severe) ([place])   . Postoperative surgical complication involving circulatory system   . Physical debility 09/14/2014  . Acute renal failure syndrome ([place])   . Hep C w/ coma, chronic ([place])   . Joint infection ([place])   . Acute renal failure ([place])   . Hepatitis C 08/16/2014  . SIRS (systemic inflammatory response syndrome) ([name])   . Benign essential HTN   . Septic joint of right hand ([place])   . Staphylococcus aureus bacteremia   . Septic joint of right knee joint ([place])   . ESRD (end stage renal disease) on dialysis ([place])   . Tachycardia 08/07/2014  . Septic joint of left shoulder region ([place]) 08/07/2014  . Sinus tachycardia ([place]) 08/07/2014  . BP (high blood pressure) 06/24/2013  . Disorder of peripheral nervous system ([place]) 06/24/2013  . 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  Diabetes mellitus, type 2 ([place]) 06/24/2013  . Uncontrolled hypertension 02/18/2013  . Type II or unspecified type diabetes mellitus without mention of complication, uncontrolled 02/18/2013  . Chest pain at rest 10/18/2011    Class: Acute  . Shortness of breath 10/18/2011    Class: Acute    Marna Weniger 02/11/2016, 5:48 PM  [facility] [address] [place], [place], [address] Phone: [phone]   Fax:  [phone]  Name: Brandy Houston MRN: [id number] Date of Birth: 1956/01/16   Melvenia Needles, PTA 02/11/16 5:48 PM Phone: [phone] Fax: [phone]

## 2016-02-14 ENCOUNTER — Ambulatory Visit: Payer: Medicare Other | Admitting: Physical Therapy

## 2016-02-14 DIAGNOSIS — M6281 Muscle weakness (generalized): Secondary | ICD-10-CM | POA: Diagnosis not present

## 2016-02-14 DIAGNOSIS — R600 Localized edema: Secondary | ICD-10-CM

## 2016-02-14 DIAGNOSIS — R609 Edema, unspecified: Secondary | ICD-10-CM | POA: Diagnosis not present

## 2016-02-14 DIAGNOSIS — R262 Difficulty in walking, not elsewhere classified: Secondary | ICD-10-CM

## 2016-02-14 DIAGNOSIS — M25562 Pain in left knee: Secondary | ICD-10-CM

## 2016-02-14 DIAGNOSIS — M25672 Stiffness of left ankle, not elsewhere classified: Secondary | ICD-10-CM | POA: Diagnosis not present

## 2016-02-14 NOTE — Therapy (Signed)
[place], [place], [address] Phone: [phone]   Fax:  [phone]  Physical Therapy Treatment  Patient Details  Name: Brandy Houston MRN: [id number] Date of Birth: 1956/04/04 Referring Provider: Dr. [name]   Encounter Date: 02/14/2016      PT End of Session - 02/14/16 1323    Visit Number 14   Number of Visits 21   Date for PT Re-Evaluation 02/29/16   PT Start Time 1103   PT Stop Time 1145   PT Time Calculation (min) 42 min   Activity Tolerance Patient tolerated treatment well   Behavior During Therapy [facility] for tasks assessed/performed      Past Medical History:  Diagnosis Date  . Anemia   . Anxiety   . Chronic diastolic CHF (congestive heart failure) ([place])   . Chronic kidney disease    stage IV. previous HD, none currently 10/30/15  . Depression    Chronic  . Diabetes mellitus    type 2, IDDM  . Diabetic peripheral neuropathy ([place])   . Dysrhythmia    tachycardia, normal ECHO 08-09-14  . GERD (gastroesophageal reflux disease)   . Hepatitis C 2016  . Hypertension   . Osteoarthritis of left knee   . Peripheral neuropathy ([place])   . Protein calorie malnutrition ([place])   . Septic arthritis ([place])   . Slow transit constipation   . Unsteady gait     Past Surgical History:  Procedure Laterality Date  . APPENDECTOMY    . AV FISTULA PLACEMENT Left 09/13/2014   Procedure: Brachial Artery to Brachial Vein Gortex Four - Seven Stretch GRAFT INSERTION Left Forearm;  Surgeon: Mal Misty, MD;  Location: [place];  Service: Vascular;  Laterality: Left;  . CHOLECYSTECTOMY    . KNEE ARTHROSCOPY Right 08/10/2014   Procedure: ARTHROSCOPY I & D KNEE;  Surgeon: Marianna Payment, MD;  [facility];  Service: Orthopedics;  Laterality: Right;  . KNEE ARTHROSCOPY Left 08/11/2014   Procedure: ARTHROSCOPIC WASHOUT LEFT KNEE;  Surgeon: Marianna Payment, MD;  Location: [place];  Service: Orthopedics;  Laterality:  Left;  . KNEE ARTHROSCOPY Left 08/19/2014   Procedure: ARTHROSCOPIC WASHOUT LEFT KNEE;  Surgeon: Leandrew Koyanagi, MD;  Location: [place];  Service: Orthopedics;  Laterality: Left;  . KNEE ARTHROSCOPY WITH LATERAL MENISECTOMY Left 04/04/2015   Procedure: AND PARTIAL LATERAL MENISECTOMY;  Surgeon: Leandrew Koyanagi, MD;  Location: [place];  Service: Orthopedics;  Laterality: Left;  . KNEE ARTHROSCOPY WITH MEDIAL MENISECTOMY Left 04/04/2015   Procedure: LEFT KNEE ARTHROSCOPY WITH PARTIAL MEDIAL MENISCECTOMY  AND SYNOVECTOMY;  Surgeon: Leandrew Koyanagi, MD;  Location: [place];  Service: Orthopedics;  Laterality: Left;  . SHOULDER ARTHROSCOPY Bilateral 08/10/2014   Procedure: I & D BILATERAL SHOULDERS ;  Surgeon: Marianna Payment, MD;  [facility];  Service: Orthopedics;  Laterality: Bilateral;  . SMALL INTESTINE SURGERY     Due to Small Bowel Obstruction  . TEE WITHOUT CARDIOVERSION N/A 08/14/2014   Procedure: TRANSESOPHAGEAL ECHOCARDIOGRAM (TEE);  Surgeon: Thayer Headings, MD;  Location: [place];  Service: Cardiovascular;  Laterality: N/A;  . TENOSYNOVECTOMY Right 08/11/2014   Procedure: RIGHT WRIST IRRIGATION AND DEBRIDEMENT, TENOSYNOVECTOMY;  Surgeon: Marianna Payment, MD;  Location: [place];  Service: Orthopedics;  Laterality: Right;  . TOTAL KNEE ARTHROPLASTY Left 11/07/2015   Procedure: LEFT TOTAL KNEE ARTHROPLASTY WITH REVISION OF IMPLANTS;  Surgeon: Leandrew Koyanagi, MD;  [facility];  Service: Orthopedics;  Laterality: Left;  . TUBAL LIGATION      There were no vitals filed for this visit.      Subjective Assessment - 02/14/16 1112    Subjective K nee 5, ankle Left 5/10  aches   Currently in Pain? Yes   Pain Score 5    Pain Location Knee   Pain Score 6   Pain Orientation Left   Pain Descriptors / Indicators Aching   Pain Frequency Intermittent   Aggravating Factors  random not sure, hurts at night,  Spasm meds just restarted ran out earlier this week    Pain Relieving Factors nothing helped yesterday            [facility] PT Assessment - 02/14/16 0001      AROM   Left Knee Extension 0   Left Knee Flexion --  115 PROM LT                     [facility] Adult PT Treatment/Exercise - 02/14/16 0001      Knee/Hip Exercises: Stretches   Sports administrator --  Standing step stretch for quads and ankle 10 X 5 seconds.     Knee/Hip Exercises: Standing   Rocker Board 1 minute  CGA.  PTA Braced knee to avoid hyperextension knee     Knee/Hip Exercises: Supine   Short Arc Quad Sets 3 sets;10 reps  0,5,8 pounds   Heel Slides 10 reps;1 set   Patellar Mobs yes     Moist Heat Therapy   Number Minutes Moist Heat 15 Minutes   Moist Heat Location --  ankle, foot     Manual Therapy   Manual Therapy Passive ROM   Passive ROM knee flexion, foot 4 way ROM,  toe stretches Left     Ankle Exercises: Seated   Ankle Circles/Pumps AROM;Left;10 reps   Toe Raise Limitations painful     Ankle Exercises: Stretches   Plantar Fascia Stretch Limitations roling  ball under foot 3 minutes   Other Stretch toe stretch     Ankle Exercises: Supine   Isometrics 3 way 5 X, 3-5 secondsLimited ROM                  PT Short Term Goals - 02/11/16 1744      PT SHORT TERM GOAL #1   Title Pt will be able to be I with HEP for knee AROM/AAROM    Time 4   Period Weeks   Status Achieved     PT SHORT TERM GOAL #2   Title Pt will be able to bend knee in sitting to 95 deg for improved transfers   Period Weeks   Status Achieved     PT SHORT TERM GOAL #3   Title Pt will be able to understand RICE, edema mgmt    Baseline understands   Time 4   Period Weeks   Status Achieved           PT Long Term Goals - 02/11/16 1745      PT LONG TERM GOAL #1   Title Pt will be able to be I with more advanced HEP    Baseline independent with exercises so far.     Time 8   Period Weeks   Status On-going     PT LONG TERM GOAL #2   Title Pt will  score FOTO less than 48% limited   Baseline LEFS 26/80   Time  8   Period Weeks   Status On-going     PT LONG TERM GOAL #3   Title Pt will be able to walk with LRAD in community as needed with min increase in knee pain.    Baseline Uses rollator in community,  uses electric cart in grocery store.  Pain increases with walking 5/10.  Sometimes shops short distances with the cart.   Time 8   Period Weeks   Status On-going     PT LONG TERM GOAL #4   Title Pt will demo knee AROM flexion to 110 deg for improved transfers.    Baseline 110 sitting AROM ,  consistant?   Time 8   Period Weeks   Status Partially Met     PT LONG TERM GOAL #5   Title Pt will demo 4+/5 in L knee for improved stability with gait, function with stairs.    Time 8   Period Weeks   Status Unable to assess     PT LONG TERM GOAL #6   Title 8   Time 8   Period Weeks   Status Unable to assess               Plan - 02/14/16 1323    Clinical Impression Statement PROM 1115PROM LT knee today.  Patient able to focus on her home exercises.  Pain in foot ankle much improved after session.   PT Next Visit Plan continue strength, ROM and balance.   See how foot ankle are doingaddress as needed   PT Home Exercise Plan continue   Consulted and Agree with Plan of Care Patient      Patient will benefit from skilled therapeutic intervention in order to improve the following deficits and impairments:  Abnormal gait, Decreased activity tolerance, Decreased balance, Decreased mobility, Decreased strength, Increased edema, Impaired sensation, Postural dysfunction, Impaired flexibility, Hypomobility, Decreased scar mobility, Decreased skin integrity, Pain, Decreased range of motion, Difficulty walking, Increased fascial restricitons  Visit Diagnosis: Localized edema  Pain in left knee  Difficulty in walking, not elsewhere classified  Muscle weakness (generalized)  Edema, unspecified     Problem List Patient  Active Problem List   Diagnosis Date Noted  . Chronic diastolic CHF (congestive heart failure) ([place]) 11/08/2015  . Uncontrolled type 2 diabetes mellitus with complication ([place])   . Status post total left knee replacement   . Essential hypertension   . Acute blood loss anemia   . Diabetic peripheral neuropathy ([place])   . Anxiety state   . Depression   . Knee joint replacement status 11/07/2015  . Total knee replacement status 11/07/2015  . Normocytic anemia 11/07/2015  . Diabetes mellitus with complication ([place])   . Chronic kidney disease (CKD), stage IV (severe) ([place])   . Postoperative surgical complication involving circulatory system   . Physical debility 09/14/2014  . Acute renal failure syndrome ([place])   . Hep C w/ coma, chronic ([place])   . Joint infection ([place])   . Acute renal failure ([place])   . Hepatitis C 08/16/2014  . SIRS (systemic inflammatory response syndrome) ([name])   . Benign essential HTN   . Septic joint of right hand ([place])   . Staphylococcus aureus bacteremia   . Septic joint of right knee joint ([place])   . ESRD (end stage renal disease) on dialysis ([place])   . Tachycardia 08/07/2014  . Septic joint of left shoulder region ([place]) 08/07/2014  . Sinus tachycardia ([place]) 08/07/2014  . BP (high blood  pressure) 06/24/2013  . Disorder of peripheral nervous system ([place]) 06/24/2013  . 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 Diabetes mellitus, type 2 ([place]) 06/24/2013  . Uncontrolled hypertension 02/18/2013  . Type II or unspecified type diabetes mellitus without mention of complication, uncontrolled 02/18/2013  . Chest pain at rest 10/18/2011    Class: Acute  . Shortness of breath 10/18/2011    Class: Acute    Terrell Shimko 02/14/2016, 1:29 PM  [facility] [address] [place], [place], [address] Phone: [phone]   Fax:  [phone]  Name: WRENLY LAURITSEN MRN: [id number] Date of Birth: 10-25-1955   Melvenia Needles, PTA 02/14/16 1:29 PM Phone: [phone] Fax: [phone]

## 2016-02-20 ENCOUNTER — Ambulatory Visit: Payer: Medicare Other | Attending: Orthopaedic Surgery | Admitting: Physical Therapy

## 2016-02-20 DIAGNOSIS — R600 Localized edema: Secondary | ICD-10-CM | POA: Diagnosis not present

## 2016-02-20 DIAGNOSIS — M6281 Muscle weakness (generalized): Secondary | ICD-10-CM | POA: Diagnosis not present

## 2016-02-20 DIAGNOSIS — M25562 Pain in left knee: Secondary | ICD-10-CM

## 2016-02-20 DIAGNOSIS — R262 Difficulty in walking, not elsewhere classified: Secondary | ICD-10-CM | POA: Diagnosis not present

## 2016-02-20 DIAGNOSIS — R609 Edema, unspecified: Secondary | ICD-10-CM | POA: Insufficient documentation

## 2016-02-20 DIAGNOSIS — M25672 Stiffness of left ankle, not elsewhere classified: Secondary | ICD-10-CM | POA: Diagnosis not present

## 2016-02-20 NOTE — Therapy (Signed)
[place], [place], [address] Phone: [phone]   Fax:  [phone]  Physical Therapy Treatment  Patient Details  Name: Brandy Houston MRN: [id number] Date of Birth: Feb 18, 1956 Referring Provider: Dr. [name]   Encounter Date: 02/20/2016      PT End of Session - 02/20/16 1612    Visit Number 15   Number of Visits 21   PT Start Time 1423   PT Stop Time 1502   PT Time Calculation (min) 39 min   Activity Tolerance Patient tolerated treatment well   Behavior During Therapy [facility] for tasks assessed/performed      Past Medical History:  Diagnosis Date  . Anemia   . Anxiety   . Chronic diastolic CHF (congestive heart failure) ([place])   . Chronic kidney disease    stage IV. previous HD, none currently 10/30/15  . Depression    Chronic  . Diabetes mellitus    type 2, IDDM  . Diabetic peripheral neuropathy ([place])   . Dysrhythmia    tachycardia, normal ECHO 08-09-14  . GERD (gastroesophageal reflux disease)   . Hepatitis C 2016  . Hypertension   . Osteoarthritis of left knee   . Peripheral neuropathy ([place])   . Protein calorie malnutrition ([place])   . Septic arthritis ([place])   . Slow transit constipation   . Unsteady gait     Past Surgical History:  Procedure Laterality Date  . APPENDECTOMY    . AV FISTULA PLACEMENT Left 09/13/2014   Procedure: Brachial Artery to Brachial Vein Gortex Four - Seven Stretch GRAFT INSERTION Left Forearm;  Surgeon: Mal Misty, MD;  Location: [place];  Service: Vascular;  Laterality: Left;  . CHOLECYSTECTOMY    . KNEE ARTHROSCOPY Right 08/10/2014   Procedure: ARTHROSCOPY I & D KNEE;  Surgeon: Marianna Payment, MD;  [facility];  Service: Orthopedics;  Laterality: Right;  . KNEE ARTHROSCOPY Left 08/11/2014   Procedure: ARTHROSCOPIC WASHOUT LEFT KNEE;  Surgeon: Marianna Payment, MD;  Location: [place];  Service: Orthopedics;  Laterality: Left;  . KNEE ARTHROSCOPY Left 08/19/2014   Procedure: ARTHROSCOPIC WASHOUT LEFT KNEE;  Surgeon: Leandrew Koyanagi, MD;  Location: [place];  Service: Orthopedics;  Laterality: Left;  . KNEE ARTHROSCOPY WITH LATERAL MENISECTOMY Left 04/04/2015   Procedure: AND PARTIAL LATERAL MENISECTOMY;  Surgeon: Leandrew Koyanagi, MD;  Location: [place];  Service: Orthopedics;  Laterality: Left;  . KNEE ARTHROSCOPY WITH MEDIAL MENISECTOMY Left 04/04/2015   Procedure: LEFT KNEE ARTHROSCOPY WITH PARTIAL MEDIAL MENISCECTOMY  AND SYNOVECTOMY;  Surgeon: Leandrew Koyanagi, MD;  Location: [place];  Service: Orthopedics;  Laterality: Left;  . SHOULDER ARTHROSCOPY Bilateral 08/10/2014   Procedure: I & D BILATERAL SHOULDERS ;  Surgeon: Marianna Payment, MD;  [facility];  Service: Orthopedics;  Laterality: Bilateral;  . SMALL INTESTINE SURGERY     Due to Small Bowel Obstruction  . TEE WITHOUT CARDIOVERSION N/A 08/14/2014   Procedure: TRANSESOPHAGEAL ECHOCARDIOGRAM (TEE);  Surgeon: Thayer Headings, MD;  Location: [place];  Service: Cardiovascular;  Laterality: N/A;  . TENOSYNOVECTOMY Right 08/11/2014   Procedure: RIGHT WRIST IRRIGATION AND DEBRIDEMENT, TENOSYNOVECTOMY;  Surgeon: Marianna Payment, MD;  Location: [place];  Service: Orthopedics;  Laterality: Right;  . TOTAL KNEE ARTHROPLASTY Left 11/07/2015   Procedure: LEFT TOTAL KNEE ARTHROPLASTY WITH REVISION OF IMPLANTS;  Surgeon: Leandrew Koyanagi, MD;  Location: [place];  Service: Orthopedics;  Laterality: Left;  .  TUBAL LIGATION      There were no vitals filed for this visit.      Subjective Assessment - 02/20/16 1427    Subjective No pain  due to pain medicatrion.    Has kidney MD appointment 26th.     Currently in Pain? Yes   Pain Score 0-No pain   Pain Location Knee   Pain Orientation Left   Pain Descriptors / Indicators --  hurt a lot on Sunday,  woke up with knee pain Sunday   Pain Frequency Intermittent   Aggravating Factors  wakes with   Pain Relieving Factors rest pain  medication.    Multiple Pain Sites No            [facility] PT Assessment - 02/20/16 0001      AROM   Left Knee Extension 0   Left Knee Flexion 115  AROM                     [facility] Adult PT Treatment/Exercise - 02/20/16 0001      High Level Balance   High Level Balance Comments at counter:  alternating kicks, 3 ways CG to SBA,  side steps for/reverse walking.  side and forward steps over foam rollers.  Usr of 1 hand inter mittantly .       Self-Care   Self-Care ADL's;Lifting  good form with golfer's lift.    Lifting modified with hand resting on thigh, or elbow resting on thigh to provent dizziness.   protect back  6/10 pain with modified liifting.  correct form noted.       Knee/Hip Exercises: Stretches   Hip Flexor Stretch 1 rep;30 seconds  right, off edge of bed   Other Knee/Hip Stretches IT stretch 2 X 20  sidelying     Knee/Hip Exercises: Standing   Heel Raises 10 reps;Both  unable to do single.    Terminal Knee Extension Limitations 10 X ball press into wall,  cramps in quads       Knee/Hip Exercises: Seated   Long Arc Quad Weight 5 lbs.   Long Arc Quad Limitations 10 X    Hamstring Curl 1 set;10 reps   Hamstring Limitations green band     Knee/Hip Exercises: Supine   Heel Slides 1 set;10 reps   Straight Leg Raises 1 set;10 reps   Straight Leg Raises Limitations AROM     Knee/Hip Exercises: Sidelying   Clams 10 X 1     Manual Therapy   Manual therapy comments etrretrograde to soften  gentle passive stretches into flexion                  PT Short Term Goals - 02/11/16 1744      PT SHORT TERM GOAL #1   Title Pt will be able to be I with HEP for knee AROM/AAROM    Time 4   Period Weeks   Status Achieved     PT SHORT TERM GOAL #2   Title Pt will be able to bend knee in sitting to 95 deg for improved transfers   Period Weeks   Status Achieved     PT SHORT TERM GOAL #3   Title Pt will be able to understand RICE, edema mgmt     Baseline understands   Time 4   Period Weeks   Status Achieved           PT Long Term Goals - 02/20/16 1617  PT LONG TERM GOAL #1   Title Pt will be able to be I with more advanced HEP    Baseline independent with exercises so far.     Time 8   Period Weeks   Status On-going     PT LONG TERM GOAL #2   Baseline LEFS 26/80   Time 8   Period Weeks   Status On-going     PT LONG TERM GOAL #3   Title Pt will be able to walk with LRAD in community as needed with min increase in knee pain.    Baseline Uses rollator in community,  uses electric cart in grocery store.    Sometimes shops short distances with the cart.   Time 8   Period Weeks   Status On-going     PT LONG TERM GOAL #4   Title Pt will demo knee AROM flexion to 110 deg for improved transfers.    Baseline 115 supine AROM  ((/11/2015)   Time 8   Period Weeks   Status Achieved     PT LONG TERM GOAL #5   Title Pt will demo 4+/5 in L knee for improved stability with gait, function with stairs.    Time 8   Status Unable to assess               Plan - 02/20/16 1613    Clinical Impression Statement LTG #4 met with AROM 115.  No pain at the end of session. No ankle pain today,  ptient relates this to pain mendication.  Patient's whole body was bloated today.  She has a Kidney MD appointment later this week.  She has a medication she is not taking that is a water pill.    PT Next Visit Plan continue work on balance,  try step downs, ROM   PT Home Exercise Plan continue   Consulted and Agree with Plan of Care Patient      Patient will benefit from skilled therapeutic intervention in order to improve the following deficits and impairments:  Abnormal gait, Decreased activity tolerance, Decreased balance, Decreased mobility, Decreased strength, Increased edema, Impaired sensation, Postural dysfunction, Impaired flexibility, Hypomobility, Decreased scar mobility, Decreased skin integrity, Pain, Decreased range of  motion, Difficulty walking, Increased fascial restricitons  Visit Diagnosis: Localized edema  Pain in left knee  Difficulty in walking, not elsewhere classified  Muscle weakness (generalized)  Edema, unspecified  Stiffness of left ankle, not elsewhere classified     Problem List Patient Active Problem List   Diagnosis Date Noted  . Chronic diastolic CHF (congestive heart failure) ([place]) 11/08/2015  . Uncontrolled type 2 diabetes mellitus with complication ([place])   . Status post total left knee replacement   . Essential hypertension   . Acute blood loss anemia   . Diabetic peripheral neuropathy ([place])   . Anxiety state   . Depression   . Knee joint replacement status 11/07/2015  . Total knee replacement status 11/07/2015  . Normocytic anemia 11/07/2015  . Diabetes mellitus with complication ([place])   . Chronic kidney disease (CKD), stage IV (severe) ([place])   . Postoperative surgical complication involving circulatory system   . Physical debility 09/14/2014  . Acute renal failure syndrome ([place])   . Hep C w/ coma, chronic ([place])   . Joint infection ([place])   . Acute renal failure ([place])   . Hepatitis C 08/16/2014  . SIRS (systemic inflammatory response syndrome) ([name])   . Benign essential HTN   . Septic  joint of right hand ([place])   . Staphylococcus aureus bacteremia   . Septic joint of right knee joint ([place])   . ESRD (end stage renal disease) on dialysis ([place])   . Tachycardia 08/07/2014  . Septic joint of left shoulder region ([place]) 08/07/2014  . Sinus tachycardia ([place]) 08/07/2014  . BP (high blood pressure) 06/24/2013  . Disorder of peripheral nervous system ([place]) 06/24/2013  . 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 Diabetes mellitus, type 2 ([place]) 06/24/2013  . Uncontrolled hypertension 02/18/2013  . Type II or unspecified type diabetes mellitus without mention of complication, uncontrolled 02/18/2013  . Chest pain at rest 10/18/2011    Class: Acute  . Shortness of  breath 10/18/2011    Class: Acute    Brandy Houston 02/20/2016, 4:19 PM  [facility] [address] [place], [place], [address] Phone: [phone]   Fax:  [phone]  Name: Brandy Houston MRN: [id number] Date of Birth: [birth year]   Melvenia Needles, PTA 02/20/16 4:19 PM Phone: [phone] Fax: [phone]

## 2016-02-22 ENCOUNTER — Ambulatory Visit: Payer: Medicare Other | Admitting: Physical Therapy

## 2016-02-22 DIAGNOSIS — M6281 Muscle weakness (generalized): Secondary | ICD-10-CM | POA: Diagnosis not present

## 2016-02-22 DIAGNOSIS — M25562 Pain in left knee: Secondary | ICD-10-CM | POA: Diagnosis not present

## 2016-02-22 DIAGNOSIS — M25672 Stiffness of left ankle, not elsewhere classified: Secondary | ICD-10-CM | POA: Diagnosis not present

## 2016-02-22 DIAGNOSIS — R262 Difficulty in walking, not elsewhere classified: Secondary | ICD-10-CM | POA: Diagnosis not present

## 2016-02-22 DIAGNOSIS — R600 Localized edema: Secondary | ICD-10-CM

## 2016-02-22 DIAGNOSIS — R609 Edema, unspecified: Secondary | ICD-10-CM | POA: Diagnosis not present

## 2016-02-22 NOTE — Therapy (Signed)
[place], [place], [address] Phone: [phone]   Fax:  [phone]  Physical Therapy Treatment  Patient Details  Name: Brandy Houston MRN: [id number] Date of Birth: [birth year] Referring Provider: Dr. [name]   Encounter Date: 02/22/2016      PT End of Session - 02/22/16 1108    Visit Number 16   Number of Visits 21   Date for PT Re-Evaluation 02/29/16   PT Start Time 1055   PT Stop Time 1143   PT Time Calculation (min) 48 min   Activity Tolerance Patient tolerated treatment well   Behavior During Therapy [facility] for tasks assessed/performed      Past Medical History:  Diagnosis Date  . Anemia   . Anxiety   . Chronic diastolic CHF (congestive heart failure) ([place])   . Chronic kidney disease    stage IV. previous HD, none currently 10/30/15  . Depression    Chronic  . Diabetes mellitus    type 2, IDDM  . Diabetic peripheral neuropathy ([place])   . Dysrhythmia    tachycardia, normal ECHO 08-09-14  . GERD (gastroesophageal reflux disease)   . Hepatitis C 2016  . Hypertension   . Osteoarthritis of left knee   . Peripheral neuropathy ([place])   . Protein calorie malnutrition ([place])   . Septic arthritis ([place])   . Slow transit constipation   . Unsteady gait     Past Surgical History:  Procedure Laterality Date  . APPENDECTOMY    . AV FISTULA PLACEMENT Left 09/13/2014   Procedure: Brachial Artery to Brachial Vein Gortex Four - Seven Stretch GRAFT INSERTION Left Forearm;  Surgeon: Mal Misty, MD;  Location: [place];  Service: Vascular;  Laterality: Left;  . CHOLECYSTECTOMY    . KNEE ARTHROSCOPY Right 08/10/2014   Procedure: ARTHROSCOPY I & D KNEE;  Surgeon: Marianna Payment, MD;  [facility];  Service: Orthopedics;  Laterality: Right;  . KNEE ARTHROSCOPY Left 08/11/2014   Procedure: ARTHROSCOPIC WASHOUT LEFT KNEE;  Surgeon: Marianna Payment, MD;  Location: [place];  Service: Orthopedics;  Laterality: Left;   . KNEE ARTHROSCOPY Left 08/19/2014   Procedure: ARTHROSCOPIC WASHOUT LEFT KNEE;  Surgeon: Leandrew Koyanagi, MD;  Location: [place];  Service: Orthopedics;  Laterality: Left;  . KNEE ARTHROSCOPY WITH LATERAL MENISECTOMY Left 04/04/2015   Procedure: AND PARTIAL LATERAL MENISECTOMY;  Surgeon: Leandrew Koyanagi, MD;  Location: [place];  Service: Orthopedics;  Laterality: Left;  . KNEE ARTHROSCOPY WITH MEDIAL MENISECTOMY Left 04/04/2015   Procedure: LEFT KNEE ARTHROSCOPY WITH PARTIAL MEDIAL MENISCECTOMY  AND SYNOVECTOMY;  Surgeon: Leandrew Koyanagi, MD;  Location: [place];  Service: Orthopedics;  Laterality: Left;  . SHOULDER ARTHROSCOPY Bilateral 08/10/2014   Procedure: I & D BILATERAL SHOULDERS ;  Surgeon: Marianna Payment, MD;  [facility];  Service: Orthopedics;  Laterality: Bilateral;  . SMALL INTESTINE SURGERY     Due to Small Bowel Obstruction  . TEE WITHOUT CARDIOVERSION N/A 08/14/2014   Procedure: TRANSESOPHAGEAL ECHOCARDIOGRAM (TEE);  Surgeon: Thayer Headings, MD;  Location: [place];  Service: Cardiovascular;  Laterality: N/A;  . TENOSYNOVECTOMY Right 08/11/2014   Procedure: RIGHT WRIST IRRIGATION AND DEBRIDEMENT, TENOSYNOVECTOMY;  Surgeon: Marianna Payment, MD;  Location: [place];  Service: Orthopedics;  Laterality: Right;  . TOTAL KNEE ARTHROPLASTY Left 11/07/2015   Procedure: LEFT TOTAL KNEE ARTHROPLASTY WITH REVISION OF IMPLANTS;  Surgeon: Leandrew Koyanagi, MD;  [facility];  Service: Orthopedics;  Laterality: Left;  . TUBAL LIGATION      There were no vitals filed for this visit.      Subjective Assessment - 02/22/16 1119    Subjective Pain today is 5/10. No pain meds today.     Currently in Pain? Yes   Pain Score 5    Pain Location Knee   Pain Orientation Left   Pain Type Chronic pain   Pain Onset More than a month ago   Pain Frequency Intermittent   Aggravating Factors  early AM    Pain Relieving Factors rest, meds   Pain Score 0   Pain  Location Ankle   Pain Orientation Left            [facility] PT Assessment - 02/22/16 1109      Strength   Right Hip Flexion 5/5   Left Hip Flexion 4/5   Left Knee Flexion 4+/5   Left Knee Extension 4/5   Right Ankle Dorsiflexion 5/5   Left Ankle Dorsiflexion 4/5     Transfers   Sit to Stand 6: Modified independent (Device/Increase time)   Comments worked on sit to stand with no UE support, used hands in front of body to increase hip activation                      [facility] Adult PT Treatment/Exercise - 02/22/16 1109      Self-Care   Lifting post PT group and independent exercise , pool at [employer].      Knee/Hip Exercises: Stretches   Knee: Self-Stretch to increase Flexion Left;5 reps     Knee/Hip Exercises: Standing   Heel Raises 10 reps;Both  unable to do single.    Hip Abduction Stengthening;Both;1 set;10 reps   Abduction Limitations compensations    Hip Extension Stengthening;Both;1 set;2 sets;10 reps;Knee bent;Knee straight   Extension Limitations leaning over bolster    Other Standing Knee Exercises tandem stance 30 sec each, head turns side to side, eyes closed x 3 trials with close supervision      Knee/Hip Exercises: Seated   Long Arc Quad Left;1 set;20 reps;Weights   Long Arc Quad Weight 5 lbs.   Clamshell with Thera[employer] Blue   Knee/Hip Flexion x 20 clam and hip flexion    Marching Limitations x 20    Sit to [place] --  lacks knee control, hyper ext      Knee/Hip Exercises: Supine   Straight Leg Raises Strengthening;Left;1 set;20 reps   Straight Leg Raise with External Rotation Strengthening;Left;1 set;20 reps   Knee Flexion AAROM;Left;1 set;5 reps   Knee Flexion Limitations 115 deg   Other Supine Knee/Hip Exercises knee to chest x 30 sec x 3 each LE                 PT Education - 02/22/16 1150    Education provided Yes   Education Details post PT plan    Person(s) Educated Patient   Methods Explanation;Demonstration    Comprehension Verbalized understanding;Returned demonstration          PT Short Term Goals - 02/11/16 1744      PT SHORT TERM GOAL #1   Title Pt will be able to be I with HEP for knee AROM/AAROM    Time 4   Period Weeks   Status Achieved     PT SHORT TERM GOAL #2   Title Pt will be able to bend knee in sitting to  95 deg for improved transfers   Period Weeks   Status Achieved     PT SHORT TERM GOAL #3   Title Pt will be able to understand RICE, edema mgmt    Baseline understands   Time 4   Period Weeks   Status Achieved           PT Long Term Goals - 02/20/16 1617      PT LONG TERM GOAL #1   Title Pt will be able to be I with more advanced HEP    Baseline independent with exercises so far.     Time 8   Period Weeks   Status On-going     PT LONG TERM GOAL #2   Baseline LEFS 26/80   Time 8   Period Weeks   Status On-going     PT LONG TERM GOAL #3   Title Pt will be able to walk with LRAD in community as needed with min increase in knee pain.    Baseline Uses rollator in community,  uses electric cart in grocery store.    Sometimes shops short distances with the cart.   Time 8   Period Weeks   Status On-going     PT LONG TERM GOAL #4   Title Pt will demo knee AROM flexion to 110 deg for improved transfers.    Baseline 115 supine AROM  ((/11/2015)   Time 8   Period Weeks   Status Achieved     PT LONG TERM GOAL #5   Title Pt will demo 4+/5 in L knee for improved stability with gait, function with stairs.    Time 8   Status Unable to assess               Plan - 02/22/16 1151    Clinical Impression Statement Patient cont to be limited in hip and quad strength, has poor balance especially with head turns and eyes closed.  She likely has issues with ankle, foot proprioception due to chronic health issues.  considering coming to post PT group after DC next week.    Rehab Potential Good   PT Next Visit Plan FOTO continue work on balance,  try step  downs, ROM   PT Home Exercise Plan continue   Consulted and Agree with Plan of Care Patient    GCODE! Look at 01/18/16, [facility] was done but there was no score.   Patient will benefit from skilled therapeutic intervention in order to improve the following deficits and impairments:  Abnormal gait, Decreased activity tolerance, Decreased balance, Decreased mobility, Decreased strength, Increased edema, Impaired sensation, Postural dysfunction, Impaired flexibility, Hypomobility, Decreased scar mobility, Decreased skin integrity, Pain, Decreased range of motion, Difficulty walking, Increased fascial restricitons  Visit Diagnosis: Localized edema  Pain in left knee  Difficulty in walking, not elsewhere classified  Muscle weakness (generalized)  Stiffness of left ankle, not elsewhere classified     Problem List Patient Active Problem List   Diagnosis Date Noted  . Chronic diastolic CHF (congestive heart failure) ([place]) 11/08/2015  . Uncontrolled type 2 diabetes mellitus with complication ([place])   . Status post total left knee replacement   . Essential hypertension   . Acute blood loss anemia   . Diabetic peripheral neuropathy ([place])   . Anxiety state   . Depression   . Knee joint replacement status 11/07/2015  . Total knee replacement status 11/07/2015  . Normocytic anemia 11/07/2015  . Diabetes mellitus with complication ([place])   .  Chronic kidney disease (CKD), stage IV (severe) ([place])   . Postoperative surgical complication involving circulatory system   . Physical debility 09/14/2014  . Acute renal failure syndrome ([place])   . Hep C w/ coma, chronic ([place])   . Joint infection ([place])   . Acute renal failure ([place])   . Hepatitis C 08/16/2014  . SIRS (systemic inflammatory response syndrome) ([name])   . Benign essential HTN   . Septic joint of right hand ([place])   . Staphylococcus aureus bacteremia   . Septic joint of right knee joint ([place])   . ESRD (end stage renal disease) on dialysis  ([place])   . Tachycardia 08/07/2014  . Septic joint of left shoulder region ([place]) 08/07/2014  . Sinus tachycardia ([place]) 08/07/2014  . BP (high blood pressure) 06/24/2013  . Disorder of peripheral nervous system ([place]) 06/24/2013  . 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 Diabetes mellitus, type 2 ([place]) 06/24/2013  . Uncontrolled hypertension 02/18/2013  . Type II or unspecified type diabetes mellitus without mention of complication, uncontrolled 02/18/2013  . Chest pain at rest 10/18/2011    Class: Acute  . Shortness of breath 10/18/2011    Class: Acute    PAA,JENNIFER 02/22/2016, 11:57 AM  [facility] [address] [place], [place], [address] Phone: [phone]   Fax:  [phone]  Name: Brandy Houston MRN: [id number] Date of Birth: 11/03/1955   Raeford Razor, PT 02/22/16 11:57 AM Phone: [phone] Fax: [phone]

## 2016-02-25 ENCOUNTER — Ambulatory Visit: Payer: Medicare Other | Admitting: Physical Therapy

## 2016-02-25 DIAGNOSIS — R600 Localized edema: Secondary | ICD-10-CM | POA: Diagnosis not present

## 2016-02-25 DIAGNOSIS — M25672 Stiffness of left ankle, not elsewhere classified: Secondary | ICD-10-CM | POA: Diagnosis not present

## 2016-02-25 DIAGNOSIS — M25562 Pain in left knee: Secondary | ICD-10-CM

## 2016-02-25 DIAGNOSIS — R609 Edema, unspecified: Secondary | ICD-10-CM | POA: Diagnosis not present

## 2016-02-25 DIAGNOSIS — M6281 Muscle weakness (generalized): Secondary | ICD-10-CM | POA: Diagnosis not present

## 2016-02-25 DIAGNOSIS — R262 Difficulty in walking, not elsewhere classified: Secondary | ICD-10-CM | POA: Diagnosis not present

## 2016-02-25 NOTE — Therapy (Signed)
[place], [place], [address] Phone: [phone]   Fax:  [phone]  Physical Therapy Treatment  Patient Details  Name: Brandy Houston MRN: [id number] Date of Birth: 1956/04/08 Referring Provider: Dr. [name]   Encounter Date: 02/25/2016      PT End of Session - 02/25/16 1812    Visit Number 17   Number of Visits 21   Date for PT Re-Evaluation 02/29/16   PT Start Time 7989   PT Stop Time 1502   PT Time Calculation (min) 45 min   Activity Tolerance Patient tolerated treatment well   Behavior During Therapy [facility] for tasks assessed/performed      Past Medical History:  Diagnosis Date  . Anemia   . Anxiety   . Chronic diastolic CHF (congestive heart failure) ([place])   . Chronic kidney disease    stage IV. previous HD, none currently 10/30/15  . Depression    Chronic  . Diabetes mellitus    type 2, IDDM  . Diabetic peripheral neuropathy ([place])   . Dysrhythmia    tachycardia, normal ECHO 08-09-14  . GERD (gastroesophageal reflux disease)   . Hepatitis C 2016  . Hypertension   . Osteoarthritis of left knee   . Peripheral neuropathy ([place])   . Protein calorie malnutrition ([place])   . Septic arthritis ([place])   . Slow transit constipation   . Unsteady gait     Past Surgical History:  Procedure Laterality Date  . APPENDECTOMY    . AV FISTULA PLACEMENT Left 09/13/2014   Procedure: Brachial Artery to Brachial Vein Gortex Four - Seven Stretch GRAFT INSERTION Left Forearm;  Surgeon: Mal Misty, MD;  Location: [place];  Service: Vascular;  Laterality: Left;  . CHOLECYSTECTOMY    . KNEE ARTHROSCOPY Right 08/10/2014   Procedure: ARTHROSCOPY I & D KNEE;  Surgeon: Marianna Payment, MD;  [facility];  Service: Orthopedics;  Laterality: Right;  . KNEE ARTHROSCOPY Left 08/11/2014   Procedure: ARTHROSCOPIC WASHOUT LEFT KNEE;  Surgeon: Marianna Payment, MD;  Location: [place];  Service: Orthopedics;  Laterality:  Left;  . KNEE ARTHROSCOPY Left 08/19/2014   Procedure: ARTHROSCOPIC WASHOUT LEFT KNEE;  Surgeon: Leandrew Koyanagi, MD;  Location: [place];  Service: Orthopedics;  Laterality: Left;  . KNEE ARTHROSCOPY WITH LATERAL MENISECTOMY Left 04/04/2015   Procedure: AND PARTIAL LATERAL MENISECTOMY;  Surgeon: Leandrew Koyanagi, MD;  Location: [place];  Service: Orthopedics;  Laterality: Left;  . KNEE ARTHROSCOPY WITH MEDIAL MENISECTOMY Left 04/04/2015   Procedure: LEFT KNEE ARTHROSCOPY WITH PARTIAL MEDIAL MENISCECTOMY  AND SYNOVECTOMY;  Surgeon: Leandrew Koyanagi, MD;  Location: [place];  Service: Orthopedics;  Laterality: Left;  . SHOULDER ARTHROSCOPY Bilateral 08/10/2014   Procedure: I & D BILATERAL SHOULDERS ;  Surgeon: Marianna Payment, MD;  [facility];  Service: Orthopedics;  Laterality: Bilateral;  . SMALL INTESTINE SURGERY     Due to Small Bowel Obstruction  . TEE WITHOUT CARDIOVERSION N/A 08/14/2014   Procedure: TRANSESOPHAGEAL ECHOCARDIOGRAM (TEE);  Surgeon: Thayer Headings, MD;  Location: [place];  Service: Cardiovascular;  Laterality: N/A;  . TENOSYNOVECTOMY Right 08/11/2014   Procedure: RIGHT WRIST IRRIGATION AND DEBRIDEMENT, TENOSYNOVECTOMY;  Surgeon: Marianna Payment, MD;  Location: [place];  Service: Orthopedics;  Laterality: Right;  . TOTAL KNEE ARTHROPLASTY Left 11/07/2015   Procedure: LEFT TOTAL KNEE ARTHROPLASTY WITH REVISION OF IMPLANTS;  Surgeon: Leandrew Koyanagi, MD;  [facility];  Service: Orthopedics;  Laterality: Left;  . TUBAL LIGATION      There were no vitals filed for this visit.      Subjective Assessment - 02/25/16 1424    Subjective No pain.  Took her medication.    Currently in Pain? No/denies   Aggravating Factors  some exercises   Pain Relieving Factors medication   Pain Score 0   Pain Location Ankle                         [facility] Adult PT Treatment/Exercise - 02/25/16 0001      Knee/Hip Exercises: Aerobic   Nustep  L6 6 minutes     Knee/Hip Exercises: Standing   Heel Raises 10 reps;Both  unable to do single.    Hip Abduction Stengthening;Both;1 set;10 reps   Abduction Limitations compensations    Hip Extension Stengthening;Both;1 set;2 sets;10 reps;Knee bent;Knee straight   Extension Limitations leaning over bolster    Other Standing Knee Exercises tandem stance 30 sec each, head turns side to side, eyes closed x 3 trials with CGA/ close supervision      Knee/Hip Exercises: Seated   Long Arc Quad 1 set  10 second holds 10 X, 5 pounds     Shoulder Exercises: ROM/Strengthening   Cybex Press 1 plate  3 X 2 legs 5 X 1  leg AA, difficult                  PT Short Term Goals - 02/11/16 1744      PT SHORT TERM GOAL #1   Title Pt will be able to be I with HEP for knee AROM/AAROM    Time 4   Period Weeks   Status Achieved     PT SHORT TERM GOAL #2   Title Pt will be able to bend knee in sitting to 95 deg for improved transfers   Period Weeks   Status Achieved     PT SHORT TERM GOAL #3   Title Pt will be able to understand RICE, edema mgmt    Baseline understands   Time 4   Period Weeks   Status Achieved           PT Long Term Goals - 02/25/16 1815      PT LONG TERM GOAL #1   Title Pt will be able to be I with more advanced HEP    Baseline independent with exercises so far.     Time 8   Period Weeks   Status On-going     PT LONG TERM GOAL #2   Title Pt will score FOTO less than 48% limited   Time 8   Period Weeks   Status Unable to assess     PT LONG TERM GOAL #3   Title Pt will be able to walk with LRAD in community as needed with min increase in knee pain.    Baseline Uses rollator in community,  uses electric cart in grocery store.    Sometimes shops short distances with the cart.   Time 8   Period Weeks   Status On-going     PT LONG TERM GOAL #4   Title Pt will demo knee AROM flexion to 110 deg for improved transfers.    Status Achieved     PT LONG  TERM GOAL #5   Title Pt will demo 4+/5 in L knee for improved stability with gait, function with stairs.  Time 8   Period Weeks   Status Unable to assess               Plan - 02/25/16 1812    Clinical Impression Statement Continued to work with balance,  Leg needs bracing for some exercises to prevent hyperextension of knee.  She was able to control knee with 4 inch lateral step ups.     PT Next Visit Plan continue work on balance,  try step downs, ROM   PT Home Exercise Plan continue   Consulted and Agree with Plan of Care Patient      Patient will benefit from skilled therapeutic intervention in order to improve the following deficits and impairments:  Abnormal gait, Decreased activity tolerance, Decreased balance, Decreased mobility, Decreased strength, Increased edema, Impaired sensation, Postural dysfunction, Impaired flexibility, Hypomobility, Decreased scar mobility, Decreased skin integrity, Pain, Decreased range of motion, Difficulty walking, Increased fascial restricitons  Visit Diagnosis: Localized edema  Pain in left knee  Difficulty in walking, not elsewhere classified  Muscle weakness (generalized)  Stiffness of left ankle, not elsewhere classified     Problem List Patient Active Problem List   Diagnosis Date Noted  . Chronic diastolic CHF (congestive heart failure) ([place]) 11/08/2015  . Uncontrolled type 2 diabetes mellitus with complication ([place])   . Status post total left knee replacement   . Essential hypertension   . Acute blood loss anemia   . Diabetic peripheral neuropathy ([place])   . Anxiety state   . Depression   . Knee joint replacement status 11/07/2015  . Total knee replacement status 11/07/2015  . Normocytic anemia 11/07/2015  . Diabetes mellitus with complication ([place])   . Chronic kidney disease (CKD), stage IV (severe) ([place])   . Postoperative surgical complication involving circulatory system   . Physical debility 09/14/2014  . Acute  renal failure syndrome ([place])   . Hep C w/ coma, chronic ([place])   . Joint infection ([place])   . Acute renal failure ([place])   . Hepatitis C 08/16/2014  . SIRS (systemic inflammatory response syndrome) ([name])   . Benign essential HTN   . Septic joint of right hand ([place])   . Staphylococcus aureus bacteremia   . Septic joint of right knee joint ([place])   . ESRD (end stage renal disease) on dialysis ([place])   . Tachycardia 08/07/2014  . Septic joint of left shoulder region ([place]) 08/07/2014  . Sinus tachycardia ([place]) 08/07/2014  . BP (high blood pressure) 06/24/2013  . Disorder of peripheral nervous system ([place]) 06/24/2013  . 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 Diabetes mellitus, type 2 ([place]) 06/24/2013  . Uncontrolled hypertension 02/18/2013  . Type II or unspecified type diabetes mellitus without mention of complication, uncontrolled 02/18/2013  . Chest pain at rest 10/18/2011    Class: Acute  . Shortness of breath 10/18/2011    Class: Acute    HARRIS,KAREN 02/25/2016, 6:17 PM  [facility] [address] [place], [place], [address] Phone: [phone]   Fax:  [phone]  Name: Brandy Houston MRN: [id number] Date of Birth: [birth year]   Melvenia Needles, PTA 02/25/16 6:17 PM Phone: [phone] Fax: [phone]

## 2016-02-28 ENCOUNTER — Ambulatory Visit: Payer: Medicare Other | Admitting: Physical Therapy

## 2016-02-28 DIAGNOSIS — M25672 Stiffness of left ankle, not elsewhere classified: Secondary | ICD-10-CM

## 2016-02-28 DIAGNOSIS — M6281 Muscle weakness (generalized): Secondary | ICD-10-CM | POA: Diagnosis not present

## 2016-02-28 DIAGNOSIS — R262 Difficulty in walking, not elsewhere classified: Secondary | ICD-10-CM

## 2016-02-28 DIAGNOSIS — R609 Edema, unspecified: Secondary | ICD-10-CM | POA: Diagnosis not present

## 2016-02-28 DIAGNOSIS — R600 Localized edema: Secondary | ICD-10-CM | POA: Diagnosis not present

## 2016-02-28 DIAGNOSIS — M25562 Pain in left knee: Secondary | ICD-10-CM

## 2016-02-28 NOTE — Therapy (Signed)
[place], [place], [address] Phone: [phone]   Fax:  [phone]  Physical Therapy Treatment/Discharge  Patient Details  Name: Brandy Houston MRN: [id number] Date of Birth: 12/02/1955 Referring Provider: Dr. [name]   Encounter Date: 02/28/2016      PT End of Session - 02/28/16 1529    Visit Number 18   Number of Visits 21   Date for PT Re-Evaluation 02/29/16   PT Start Time 3005   PT Stop Time 1540   PT Time Calculation (min) 47 min   Activity Tolerance Patient tolerated treatment well   Behavior During Therapy [facility] for tasks assessed/performed      Past Medical History:  Diagnosis Date  . Anemia   . Anxiety   . Chronic diastolic CHF (congestive heart failure) ([place])   . Chronic kidney disease    stage IV. previous HD, none currently 10/30/15  . Depression    Chronic  . Diabetes mellitus    type 2, IDDM  . Diabetic peripheral neuropathy ([place])   . Dysrhythmia    tachycardia, normal ECHO 08-09-14  . GERD (gastroesophageal reflux disease)   . Hepatitis C 2016  . Hypertension   . Osteoarthritis of left knee   . Peripheral neuropathy ([place])   . Protein calorie malnutrition ([place])   . Septic arthritis ([place])   . Slow transit constipation   . Unsteady gait     Past Surgical History:  Procedure Laterality Date  . APPENDECTOMY    . AV FISTULA PLACEMENT Left 09/13/2014   Procedure: Brachial Artery to Brachial Vein Gortex Four - Seven Stretch GRAFT INSERTION Left Forearm;  Surgeon: Mal Misty, MD;  Location: [place];  Service: Vascular;  Laterality: Left;  . CHOLECYSTECTOMY    . KNEE ARTHROSCOPY Right 08/10/2014   Procedure: ARTHROSCOPY I & D KNEE;  Surgeon: Marianna Payment, MD;  [facility];  Service: Orthopedics;  Laterality: Right;  . KNEE ARTHROSCOPY Left 08/11/2014   Procedure: ARTHROSCOPIC WASHOUT LEFT KNEE;  Surgeon: Marianna Payment, MD;  Location: [place];  Service: Orthopedics;   Laterality: Left;  . KNEE ARTHROSCOPY Left 08/19/2014   Procedure: ARTHROSCOPIC WASHOUT LEFT KNEE;  Surgeon: Leandrew Koyanagi, MD;  Location: [place];  Service: Orthopedics;  Laterality: Left;  . KNEE ARTHROSCOPY WITH LATERAL MENISECTOMY Left 04/04/2015   Procedure: AND PARTIAL LATERAL MENISECTOMY;  Surgeon: Leandrew Koyanagi, MD;  Location: [place];  Service: Orthopedics;  Laterality: Left;  . KNEE ARTHROSCOPY WITH MEDIAL MENISECTOMY Left 04/04/2015   Procedure: LEFT KNEE ARTHROSCOPY WITH PARTIAL MEDIAL MENISCECTOMY  AND SYNOVECTOMY;  Surgeon: Leandrew Koyanagi, MD;  Location: [place];  Service: Orthopedics;  Laterality: Left;  . SHOULDER ARTHROSCOPY Bilateral 08/10/2014   Procedure: I & D BILATERAL SHOULDERS ;  Surgeon: Marianna Payment, MD;  [facility];  Service: Orthopedics;  Laterality: Bilateral;  . SMALL INTESTINE SURGERY     Due to Small Bowel Obstruction  . TEE WITHOUT CARDIOVERSION N/A 08/14/2014   Procedure: TRANSESOPHAGEAL ECHOCARDIOGRAM (TEE);  Surgeon: Thayer Headings, MD;  Location: [place];  Service: Cardiovascular;  Laterality: N/A;  . TENOSYNOVECTOMY Right 08/11/2014   Procedure: RIGHT WRIST IRRIGATION AND DEBRIDEMENT, TENOSYNOVECTOMY;  Surgeon: Marianna Payment, MD;  Location: [place];  Service: Orthopedics;  Laterality: Right;  . TOTAL KNEE ARTHROPLASTY Left 11/07/2015   Procedure: LEFT TOTAL KNEE ARTHROPLASTY WITH REVISION OF IMPLANTS;  Surgeon: Leandrew Koyanagi, MD;  Location: [facility];  Service: Orthopedics;  Laterality: Left;  . TUBAL LIGATION      There were no vitals filed for this visit.      Subjective Assessment - 02/28/16 1454    Subjective I've been calling [facility] to go there to continue.  If they're full, I'll come here.  I'm ready to not be sore.  Knee is sore today.  Medicare is Discontinued end of the month.              [facility] PT Assessment - 02/28/16 1508      AROM   Left Ankle Dorsiflexion 5   Left Ankle Plantar  Flexion 50   Left Ankle Inversion 25   Left Ankle Eversion 13     Strength   Left Knee Flexion 5/5   Left Knee Extension 4/5   Right Ankle Dorsiflexion 5/5   Left Ankle Dorsiflexion 5/5                     [facility] Adult PT Treatment/Exercise - 02/28/16 1537      Self-Care   Lifting ankle AROM, full HEP review, progress,     Knee/Hip Exercises: Stretches   Knee: Self-Stretch to increase Flexion Left;3 reps   Gastroc Stretch Both;2 reps;30 seconds     Knee/Hip Exercises: Aerobic   Nustep L6, 8 minutes     Knee/Hip Exercises: Standing   Heel Raises Both;1 set;20 reps   Forward Step Up Left;2 sets;20 reps;Hand Hold: 2;Step Height: 6";Step Height: 8"   Step Down Left;1 set;10 reps;Hand Hold: 2;Step Height: 4"   Wall Squat 1 set;10 reps;10 seconds     Knee/Hip Exercises: Supine   Quad Sets Strengthening;Left;1 set   Straight Leg Raises Strengthening;Left;1 set   Knee Flexion AAROM;Left;1 set;5 reps   Knee Flexion Limitations 115 deg   Other Supine Knee/Hip Exercises knee to chest x 30 sec x 3 each LE                   PT Short Term Goals - 02/11/16 1744      PT SHORT TERM GOAL #1   Title Pt will be able to be I with HEP for knee AROM/AAROM    Time 4   Period Weeks   Status Achieved     PT SHORT TERM GOAL #2   Title Pt will be able to bend knee in sitting to 95 deg for improved transfers   Period Weeks   Status Achieved     PT SHORT TERM GOAL #3   Title Pt will be able to understand RICE, edema mgmt    Baseline understands   Time 4   Period Weeks   Status Achieved           PT Long Term Goals - 02/28/16 1528      PT LONG TERM GOAL #1   Title Pt will be able to be I with more advanced HEP    Status Achieved     PT LONG TERM GOAL #2   Title Pt will score FOTO less than 48% limited   Baseline LEFS 32/80, improved 6 points    Status Partially Met     PT LONG TERM GOAL #3   Title Pt will be able to walk with LRAD in community as  needed with min increase in knee pain.    Baseline pain increases >5/10 with walking in [employer] >15 min , doesn;t do very often    Status Partially  Met     PT LONG TERM GOAL #4   Title Pt will demo knee AROM flexion to 110 deg for improved transfers.    Status Achieved     PT LONG TERM GOAL #5   Title Pt will demo 4+/5 in L knee for improved stability with gait, function with stairs.    Baseline 5/5 hamstrings,  4/5 quads   Status Partially Met               Plan - 03/13/2016 1540    Clinical Impression Statement Pt has met most of her LTGs.  Quad still weak, poor knee control.  Advised her to work on that in standing with bilateral UE assist. Plans to do aquatic classes to further her conditioning.     PT Next Visit Plan NA, DC    PT Home Exercise Plan continue   Consulted and Agree with Plan of Care Patient      Patient will benefit from skilled therapeutic intervention in order to improve the following deficits and impairments:  Abnormal gait, Decreased activity tolerance, Decreased balance, Decreased mobility, Decreased strength, Increased edema, Impaired sensation, Postural dysfunction, Impaired flexibility, Hypomobility, Decreased scar mobility, Decreased skin integrity, Pain, Decreased range of motion, Difficulty walking, Increased fascial restricitons  Visit Diagnosis: Localized edema  Pain in left knee  Difficulty in walking, not elsewhere classified  Muscle weakness (generalized)  Stiffness of left ankle, not elsewhere classified       G-Codes - 03-13-2016 1530    Functional Assessment Tool Used FOTO    Functional Limitation Mobility: Walking and moving around   Mobility: Walking and Moving Around Current Status [phone]) At least 40 percent but less than 60 percent impaired, limited or restricted   Mobility: Walking and Moving Around Goal Status [phone]) At least 40 percent but less than 60 percent impaired, limited or restricted   Mobility: Walking and Moving  Around Discharge Status [phone]) At least 40 percent but less than 60 percent impaired, limited or restricted      Problem List Patient Active Problem List   Diagnosis Date Noted  . Chronic diastolic CHF (congestive heart failure) ([place]) 11/08/2015  . Uncontrolled type 2 diabetes mellitus with complication ([place])   . Status post total left knee replacement   . Essential hypertension   . Acute blood loss anemia   . Diabetic peripheral neuropathy ([place])   . Anxiety state   . Depression   . Knee joint replacement status 11/07/2015  . Total knee replacement status 11/07/2015  . Normocytic anemia 11/07/2015  . Diabetes mellitus with complication ([place])   . Chronic kidney disease (CKD), stage IV (severe) ([place])   . Postoperative surgical complication involving circulatory system   . Physical debility 09/14/2014  . Acute renal failure syndrome ([place])   . Hep C w/ coma, chronic ([place])   . Joint infection ([place])   . Acute renal failure ([place])   . Hepatitis C 08/16/2014  . SIRS (systemic inflammatory response syndrome) ([name])   . Benign essential HTN   . Septic joint of right hand ([place])   . Staphylococcus aureus bacteremia   . Septic joint of right knee joint ([place])   . ESRD (end stage renal disease) on dialysis ([place])   . Tachycardia 08/07/2014  . Septic joint of left shoulder region ([place]) 08/07/2014  . Sinus tachycardia ([place]) 08/07/2014  . BP (high blood pressure) 06/24/2013  . Disorder of peripheral nervous system ([place]) 06/24/2013  . 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  Diabetes mellitus, type 2 ([place]) 06/24/2013  . Uncontrolled hypertension 02/18/2013  . Type II or unspecified type diabetes mellitus without mention of complication, uncontrolled 02/18/2013  . Chest pain at rest 10/18/2011    Class: Acute  . Shortness of breath 10/18/2011    Class: Acute    Veniamin Kincaid 02/28/2016, 3:47 PM  [facility] [address] [place], [place], [address] Phone: [phone]   Fax:  [phone]  Name: SAVINA OLSHEFSKI MRN: [id number] Date of Birth: 27-Jan-1956   PHYSICAL THERAPY DISCHARGE SUMMARY  Visits from Start of Care: 18  Current functional level related to goals / functional outcomes: See above    Remaining deficits: Quad weakness, knee control, balance, endurance, AROM , edema   Education / Equipment: HEP, gait, balance, edema mgmt Plan: Patient agrees to discharge.  Patient goals were partially met. Patient is being discharged due to being pleased with the current functional level.  ?????    Raeford Razor, PT 02/28/16 3:50 PM Phone: [phone] Fax: [phone]  Raeford Razor, PT 02/28/16 3:47 PM Phone: [phone] Fax: [phone]

## 2016-03-06 ENCOUNTER — Encounter (HOSPITAL_COMMUNITY)
Admission: RE | Admit: 2016-03-06 | Discharge: 2016-03-06 | Disposition: A | Discharge status: Home / Self Care | Payer: Medicare Other | Source: Ambulatory Visit | Attending: Nephrology | Admitting: Nephrology

## 2016-03-06 DIAGNOSIS — D638 Anemia in other chronic diseases classified elsewhere: Secondary | ICD-10-CM | POA: Insufficient documentation

## 2016-03-06 DIAGNOSIS — N184 Chronic kidney disease, stage 4 (severe): Secondary | ICD-10-CM | POA: Insufficient documentation

## 2016-03-06 NOTE — Progress Notes (Signed)
BP elevated. Patient states that she can not stay to take Clonidine because her son is waiting for her. States that her next dose of BP med is due at 3:00. Patient rescheduled for Monday and left.  I called [facility]. Left message for Doristine Bosworth to notify of above.

## 2016-03-10 ENCOUNTER — Encounter (HOSPITAL_COMMUNITY)
Admission: RE | Admit: 2016-03-10 | Discharge: 2016-03-10 | Disposition: A | Discharge status: Home / Self Care | Payer: Medicare Other | Source: Ambulatory Visit | Attending: Nephrology | Admitting: Nephrology

## 2016-03-10 DIAGNOSIS — D638 Anemia in other chronic diseases classified elsewhere: Secondary | ICD-10-CM | POA: Diagnosis not present

## 2016-03-10 DIAGNOSIS — N184 Chronic kidney disease, stage 4 (severe): Secondary | ICD-10-CM | POA: Diagnosis not present

## 2016-03-10 LAB — IRON AND TIBC
Iron: 64 ug/dL (ref 28–170)
Saturation Ratios: 22 % (ref 10.4–31.8)
TIBC: 295 ug/dL (ref 250–450)
UIBC: 231 ug/dL

## 2016-03-10 LAB — FERRITIN: Ferritin: 434 ng/mL — ABNORMAL HIGH (ref 11–307)

## 2016-03-10 MED ORDER — DARBEPOETIN ALFA 200 MCG/0.4ML IJ SOSY
PREFILLED_SYRINGE | INTRAMUSCULAR | Status: AC
  Filled 2016-03-10: qty 0.4

## 2016-03-10 MED ORDER — DARBEPOETIN ALFA 200 MCG/0.4ML IJ SOSY: 200.0000 ug | PREFILLED_SYRINGE | INTRAMUSCULAR | Status: DC

## 2016-03-11 DIAGNOSIS — Z23 Encounter for immunization: Secondary | ICD-10-CM | POA: Diagnosis not present

## 2016-03-11 DIAGNOSIS — N2581 Secondary hyperparathyroidism of renal origin: Secondary | ICD-10-CM | POA: Diagnosis not present

## 2016-03-11 DIAGNOSIS — E1129 Type 2 diabetes mellitus with other diabetic kidney complication: Secondary | ICD-10-CM | POA: Diagnosis not present

## 2016-03-11 DIAGNOSIS — Z96652 Presence of left artificial knee joint: Secondary | ICD-10-CM | POA: Diagnosis not present

## 2016-03-11 DIAGNOSIS — D631 Anemia in chronic kidney disease: Secondary | ICD-10-CM | POA: Diagnosis not present

## 2016-03-11 DIAGNOSIS — I129 Hypertensive chronic kidney disease with stage 1 through stage 4 chronic kidney disease, or unspecified chronic kidney disease: Secondary | ICD-10-CM | POA: Diagnosis not present

## 2016-03-11 DIAGNOSIS — N184 Chronic kidney disease, stage 4 (severe): Secondary | ICD-10-CM | POA: Diagnosis not present

## 2016-03-11 LAB — POCT HEMOGLOBIN-HEMACUE: Hemoglobin: 12.9 g/dL (ref 12.0–15.0)

## 2016-03-24 ENCOUNTER — Other Ambulatory Visit: Payer: Self-pay | Admitting: Internal Medicine

## 2016-03-24 ENCOUNTER — Encounter (HOSPITAL_COMMUNITY)
Admission: RE | Admit: 2016-03-24 | Discharge: 2016-03-24 | Disposition: A | Discharge status: Home / Self Care | Payer: Medicaid Other | Source: Ambulatory Visit | Attending: Nephrology | Admitting: Nephrology

## 2016-03-24 DIAGNOSIS — N184 Chronic kidney disease, stage 4 (severe): Secondary | ICD-10-CM | POA: Diagnosis present

## 2016-03-24 DIAGNOSIS — F329 Major depressive disorder, single episode, unspecified: Secondary | ICD-10-CM

## 2016-03-24 DIAGNOSIS — D638 Anemia in other chronic diseases classified elsewhere: Secondary | ICD-10-CM | POA: Insufficient documentation

## 2016-03-24 DIAGNOSIS — F32A Depression, unspecified: Secondary | ICD-10-CM

## 2016-03-24 LAB — POCT HEMOGLOBIN-HEMACUE: Hemoglobin: 12.5 g/dL (ref 12.0–15.0)

## 2016-03-24 MED ORDER — DARBEPOETIN ALFA 100 MCG/0.5ML IJ SOSY: 100.0000 ug | PREFILLED_SYRINGE | INTRAMUSCULAR | Status: DC

## 2016-04-07 ENCOUNTER — Encounter (HOSPITAL_COMMUNITY)
Admission: RE | Admit: 2016-04-07 | Discharge: 2016-04-07 | Disposition: A | Discharge status: Home / Self Care | Payer: Medicaid Other | Source: Ambulatory Visit | Attending: Nephrology | Admitting: Nephrology

## 2016-04-07 DIAGNOSIS — N184 Chronic kidney disease, stage 4 (severe): Secondary | ICD-10-CM | POA: Diagnosis not present

## 2016-04-07 LAB — IRON AND TIBC
Iron: 78 ug/dL (ref 28–170)
Saturation Ratios: 28 % (ref 10.4–31.8)
TIBC: 279 ug/dL (ref 250–450)
UIBC: 201 ug/dL

## 2016-04-07 LAB — FERRITIN: Ferritin: 485 ng/mL — ABNORMAL HIGH (ref 11–307)

## 2016-04-07 LAB — POCT HEMOGLOBIN-HEMACUE: Hemoglobin: 12.7 g/dL (ref 12.0–15.0)

## 2016-04-07 MED ORDER — DARBEPOETIN ALFA 100 MCG/0.5ML IJ SOSY: 100.0000 ug | PREFILLED_SYRINGE | INTRAMUSCULAR | Status: DC

## 2016-04-21 ENCOUNTER — Encounter (HOSPITAL_COMMUNITY): Payer: Medicare Other

## 2016-05-12 ENCOUNTER — Encounter (INDEPENDENT_AMBULATORY_CARE_PROVIDER_SITE_OTHER): Payer: Self-pay

## 2016-05-12 ENCOUNTER — Ambulatory Visit (INDEPENDENT_AMBULATORY_CARE_PROVIDER_SITE_OTHER): Payer: Medicaid Other

## 2016-05-12 ENCOUNTER — Ambulatory Visit (INDEPENDENT_AMBULATORY_CARE_PROVIDER_SITE_OTHER): Payer: Medicaid Other | Admitting: Orthopaedic Surgery

## 2016-05-12 DIAGNOSIS — M25512 Pain in left shoulder: Secondary | ICD-10-CM | POA: Diagnosis not present

## 2016-05-12 DIAGNOSIS — M1712 Unilateral primary osteoarthritis, left knee: Secondary | ICD-10-CM

## 2016-05-12 DIAGNOSIS — G8929 Other chronic pain: Secondary | ICD-10-CM | POA: Diagnosis not present

## 2016-05-12 MED ORDER — DICLOFENAC SODIUM 1 % TD GEL: 2.0000 g | Freq: Four times a day (QID) | TRANSDERMAL | 5 refills | Status: DC

## 2016-05-12 MED ORDER — METHOCARBAMOL 500 MG PO TABS: 500.0000 mg | ORAL_TABLET | Freq: Four times a day (QID) | ORAL | 2 refills | Status: DC | PRN

## 2016-05-12 NOTE — Progress Notes (Signed)
Office Visit Note   Patient: Brandy Houston           Date of Birth: 07/19/1955           MRN: [id number] Visit Date: 05/12/2016              Requested by: Jamal Maes, MD [address] [place], [place] [address] PCP: Lucrezia Starch, MD   Assessment & Plan: Visit Diagnoses:  1. Primary osteoarthritis of left knee   2. Chronic left shoulder pain     Plan: Impression for the left shoulder is before meals joint arthropathy status post infection and washout. I did not feel comfortable injecting her shoulder given previous infection there. I would recommend continue symptomatically treated minimal tearing gel to see if this will. If it doesn't then I would recommend MRI. In terms of her knee she is doing well without I watched the slight resorption around the proximal medial tibia. I do not see any signs of loosening. She certainly not tender there and there is no signs of effusion. She'll need repeat two-view x-rays of the left knee on return  Follow-Up Instructions: Return in about 6 months (around 11/09/2016) for recheck left TKA.   Orders:  Orders Placed This Encounter  Procedures  . XR Shoulder Left  . XR Knee 1-2 Views Left   Meds ordered this encounter  Medications  . diclofenac sodium (VOLTAREN) 1 % GEL    Sig: Apply 2 g topically 4 (four) times daily.    Dispense:  1 Tube    Refill:  5  . methocarbamol (ROBAXIN) 500 MG tablet    Sig: Take 1 tablet (500 mg total) by mouth every 6 (six) hours as needed for muscle spasms.    Dispense:  30 tablet    Refill:  2      Procedures: No procedures performed   Clinical Data: No additional findings.   Subjective: Chief Complaint  Patient presents with  . Left Knee - Pain  . Left Shoulder - Pain    HPI The patient is approximately 6 months status post left total knee replacement and she is doing well and ambulating with a cane. Occasionally will have some buckling of her knee. She also complains of some occasional  muscle spasms. She is complaining of some left shoulder pain about a month as gotten worse. She is having trouble raising her arm. Denies any constitutional symptoms. Review of Systems Complete review of systems negative except for history of present illness  Objective: Vital Signs: LMP 03/16/2002   Physical Exam Well-developed well-nourished no acute distress alert 3 Ortho Exam Exam of the left shoulder shows positive cross adduction sign. Before meals joint is tender. Rotator cuff testing is normal. Range of motion of the shoulder is normal. Exam of the left knee shows well-healed surgical scar. The effusion has essentially resolved. Medial proximal tibia is nontender. Range of motion is appropriate. She does have quad atrophy. Specialty Comments:  No specialty comments available.  Imaging: Xr Knee 1-2 Views Left  Result Date: 05/12/2016 Stable left total knee replacement. There is some resorption of the proximal medial tibia. No evidence of gross loosening.  Xr Shoulder Left  Result Date: 05/12/2016 Status post distal clavicle excision from previous irrigation and debridement.    PMFS History: Patient Active Problem List   Diagnosis Date Noted  . Chronic diastolic CHF (congestive heart failure) ([place]) 11/08/2015  . Uncontrolled type 2 diabetes mellitus with complication ([place])   . Status post  total left knee replacement   . Essential hypertension   . Acute blood loss anemia   . Diabetic peripheral neuropathy ([place])   . Anxiety state   . Depression   . Knee joint replacement status 11/07/2015  . Total knee replacement status 11/07/2015  . Normocytic anemia 11/07/2015  . Diabetes mellitus with complication ([place])   . Chronic kidney disease (CKD), stage IV (severe) ([place])   . Postoperative surgical complication involving circulatory system   . Physical debility 09/14/2014  . Acute renal failure syndrome ([place])   . Hep C w/ coma, chronic ([place])   . Joint infection ([place])   .  Acute renal failure ([place])   . Hepatitis C 08/16/2014  . SIRS (systemic inflammatory response syndrome) ([name])   . Benign essential HTN   . Septic joint of right hand ([place])   . Staphylococcus aureus bacteremia   . Septic joint of right knee joint ([place])   . ESRD (end stage renal disease) on dialysis ([place])   . Tachycardia 08/07/2014  . Septic joint of left shoulder region ([place]) 08/07/2014  . Sinus tachycardia 08/07/2014  . BP (high blood pressure) 06/24/2013  . Disorder of peripheral nervous system ([place]) 06/24/2013  . 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 Diabetes mellitus, type 2 ([place]) 06/24/2013  . Uncontrolled hypertension 02/18/2013  . Type II or unspecified type diabetes mellitus without mention of complication, uncontrolled 02/18/2013  . Chest pain at rest 10/18/2011    Class: Acute  . Shortness of breath 10/18/2011    Class: Acute   Past Medical History:  Diagnosis Date  . Anemia   . Anxiety   . Chronic diastolic CHF (congestive heart failure) ([place])   . Chronic kidney disease    stage IV. previous HD, none currently 10/30/15  . Depression    Chronic  . Diabetes mellitus    type 2, IDDM  . Diabetic peripheral neuropathy ([place])   . Dysrhythmia    tachycardia, normal ECHO 08-09-14  . GERD (gastroesophageal reflux disease)   . Hepatitis C 2016  . Hypertension   . Osteoarthritis of left knee   . Peripheral neuropathy ([place])   . Protein calorie malnutrition ([place])   . Septic arthritis ([place])   . Slow transit constipation   . Unsteady gait     Family History  Problem Relation Age of Onset  . Cancer Mother   . Heart disease Father   . Cancer Sister   . Cancer Brother   . Diabetes Brother     Past Surgical History:  Procedure Laterality Date  . APPENDECTOMY    . AV FISTULA PLACEMENT Left 09/13/2014   Procedure: Brachial Artery to Brachial Vein Gortex Four - Seven Stretch GRAFT INSERTION Left Forearm;  Surgeon: Mal Misty, MD;  Location: [facility];  Service:  Vascular;  Laterality: Left;  . CHOLECYSTECTOMY    . KNEE ARTHROSCOPY Right 08/10/2014   Procedure: ARTHROSCOPY I & D KNEE;  Surgeon: Marianna Payment, MD;  [facility];  Service: Orthopedics;  Laterality: Right;  . KNEE ARTHROSCOPY Left 08/11/2014   Procedure: ARTHROSCOPIC WASHOUT LEFT KNEE;  Surgeon: Marianna Payment, MD;  Location: [facility];  Service: Orthopedics;  Laterality: Left;  . KNEE ARTHROSCOPY Left 08/19/2014   Procedure: ARTHROSCOPIC WASHOUT LEFT KNEE;  Surgeon: Leandrew Koyanagi, MD;  Location: [facility];  Service: Orthopedics;  Laterality: Left;  . KNEE ARTHROSCOPY WITH LATERAL MENISECTOMY Left 04/04/2015   Procedure: AND PARTIAL LATERAL MENISECTOMY;  Surgeon: Leandrew Koyanagi,  MD;  Location: [facility];  Service: Orthopedics;  Laterality: Left;  . KNEE ARTHROSCOPY WITH MEDIAL MENISECTOMY Left 04/04/2015   Procedure: LEFT KNEE ARTHROSCOPY WITH PARTIAL MEDIAL MENISCECTOMY  AND SYNOVECTOMY;  Surgeon: Leandrew Koyanagi, MD;  Location: [facility];  Service: Orthopedics;  Laterality: Left;  . SHOULDER ARTHROSCOPY Bilateral 08/10/2014   Procedure: I & D BILATERAL SHOULDERS ;  Surgeon: Marianna Payment, MD;  [facility];  Service: Orthopedics;  Laterality: Bilateral;  . SMALL INTESTINE SURGERY     Due to Small Bowel Obstruction  . TEE WITHOUT CARDIOVERSION N/A 08/14/2014   Procedure: TRANSESOPHAGEAL ECHOCARDIOGRAM (TEE);  Surgeon: Thayer Headings, MD;  Location: [facility];  Service: Cardiovascular;  Laterality: N/A;  . TENOSYNOVECTOMY Right 08/11/2014   Procedure: RIGHT WRIST IRRIGATION AND DEBRIDEMENT, TENOSYNOVECTOMY;  Surgeon: Marianna Payment, MD;  Location: [facility];  Service: Orthopedics;  Laterality: Right;  . TOTAL KNEE ARTHROPLASTY Left 11/07/2015   Procedure: LEFT TOTAL KNEE ARTHROPLASTY WITH REVISION OF IMPLANTS;  Surgeon: Leandrew Koyanagi, MD;  Location: [facility];  Service: Orthopedics;  Laterality: Left;  . TUBAL LIGATION     Social History   Occupational  History  . Not on file.   Social History Main Topics  . Smoking status: Current Every Day Smoker    Packs/day: 0.50    Years: 20.00    Types: Cigarettes  . Smokeless tobacco: Never Used     Comment: Smoking 7-8 cigs per day  . Alcohol use No     Comment: per pt no alcohol since 09/2014  . Drug use: No  . Sexual activity: No

## 2016-06-04 ENCOUNTER — Encounter (HOSPITAL_COMMUNITY): Payer: Medicare Other

## 2016-08-07 ENCOUNTER — Encounter (HOSPITAL_COMMUNITY): Payer: Medicare Other

## 2016-08-07 ENCOUNTER — Other Ambulatory Visit (HOSPITAL_COMMUNITY): Payer: Self-pay | Admitting: *Deleted

## 2016-08-08 ENCOUNTER — Encounter (HOSPITAL_COMMUNITY)
Admission: RE | Admit: 2016-08-08 | Discharge: 2016-08-08 | Disposition: A | Discharge status: Home / Self Care | Payer: Medicare Other | Source: Ambulatory Visit | Attending: Nephrology | Admitting: Nephrology

## 2016-08-08 DIAGNOSIS — N184 Chronic kidney disease, stage 4 (severe): Secondary | ICD-10-CM

## 2016-08-08 DIAGNOSIS — D631 Anemia in chronic kidney disease: Secondary | ICD-10-CM | POA: Diagnosis present

## 2016-08-08 MED ORDER — CLONIDINE HCL 0.1 MG PO TABS
ORAL_TABLET | ORAL | Status: AC
  Filled 2016-08-08: qty 1

## 2016-08-08 MED ORDER — CLONIDINE HCL 0.1 MG PO TABS
0.1000 mg | ORAL_TABLET | Freq: Once | ORAL | Status: AC | PRN
  Administered 2016-08-08: 0.1 mg via ORAL

## 2016-08-08 MED ORDER — DARBEPOETIN ALFA 200 MCG/0.4ML IJ SOSY: 200.0000 ug | PREFILLED_SYRINGE | INTRAMUSCULAR | Status: DC

## 2016-08-08 NOTE — Progress Notes (Signed)
Pt's BP 166'M and 600'K systolic today.  Pt stated she took her lasix and hydralizine this morning 2 hours prior to appt with [place].  I gave her 0.1mg  clonidine per MD PRN orders and 30 minutes later her systolic was still in the 200's.  Pt could not wait anymore because her son brought her and he had to get back to work.  Pt rescheduled for this Monday morning and called and left Dr Arthor Captain CMA, Doristine Bosworth, a message regarding the above in detail.

## 2016-08-11 ENCOUNTER — Encounter (HOSPITAL_COMMUNITY)
Admission: RE | Admit: 2016-08-11 | Discharge: 2016-08-11 | Disposition: A | Discharge status: Home / Self Care | Payer: Medicare Other | Source: Ambulatory Visit | Attending: Nephrology | Admitting: Nephrology

## 2016-08-11 ENCOUNTER — Other Ambulatory Visit (INDEPENDENT_AMBULATORY_CARE_PROVIDER_SITE_OTHER): Payer: Self-pay | Admitting: Orthopaedic Surgery

## 2016-08-11 NOTE — Telephone Encounter (Signed)
Please advise 

## 2016-08-11 NOTE — Progress Notes (Signed)
Pt here again today with elevated BP.  Pt stated she took her BP meds at 8;00 this morning.  Took manual BP per [facility] request and that was 202/82.  Doristine Bosworth stated if manual BP still too high then to reschedule pt again, and let her know they will be calling her regarding her BP meds.  Pt verbalized understanding and I told the pt if she starts feeling bad to go to the ER.

## 2016-08-18 ENCOUNTER — Encounter (HOSPITAL_COMMUNITY)
Admission: RE | Admit: 2016-08-18 | Discharge: 2016-08-18 | Disposition: A | Discharge status: Home / Self Care | Payer: Medicaid Other | Source: Ambulatory Visit | Attending: Nephrology | Admitting: Nephrology

## 2016-08-18 DIAGNOSIS — N184 Chronic kidney disease, stage 4 (severe): Secondary | ICD-10-CM | POA: Insufficient documentation

## 2016-08-18 DIAGNOSIS — D631 Anemia in chronic kidney disease: Secondary | ICD-10-CM | POA: Insufficient documentation

## 2016-08-18 LAB — POCT HEMOGLOBIN-HEMACUE: Hemoglobin: 9.3 g/dL — ABNORMAL LOW (ref 12.0–15.0)

## 2016-08-18 LAB — IRON AND TIBC
Iron: 51 ug/dL (ref 28–170)
Saturation Ratios: 16 % (ref 10.4–31.8)
TIBC: 326 ug/dL (ref 250–450)
UIBC: 275 ug/dL

## 2016-08-18 LAB — FERRITIN: Ferritin: 320 ng/mL — ABNORMAL HIGH (ref 11–307)

## 2016-08-18 MED ORDER — DARBEPOETIN ALFA 200 MCG/0.4ML IJ SOSY
PREFILLED_SYRINGE | INTRAMUSCULAR | Status: AC
Start: 2016-08-18 — End: 2016-08-18
  Filled 2016-08-18: qty 0.4

## 2016-08-18 MED ORDER — DARBEPOETIN ALFA 200 MCG/0.4ML IJ SOSY
200.0000 ug | PREFILLED_SYRINGE | INTRAMUSCULAR | Status: DC
  Administered 2016-08-18: 200 ug via SUBCUTANEOUS

## 2016-08-19 ENCOUNTER — Ambulatory Visit (INDEPENDENT_AMBULATORY_CARE_PROVIDER_SITE_OTHER): Payer: Medicaid Other

## 2016-08-19 ENCOUNTER — Encounter (INDEPENDENT_AMBULATORY_CARE_PROVIDER_SITE_OTHER): Payer: Self-pay | Admitting: Orthopaedic Surgery

## 2016-08-19 ENCOUNTER — Ambulatory Visit (INDEPENDENT_AMBULATORY_CARE_PROVIDER_SITE_OTHER): Payer: Medicaid Other | Admitting: Orthopaedic Surgery

## 2016-08-19 DIAGNOSIS — M25562 Pain in left knee: Secondary | ICD-10-CM

## 2016-08-19 DIAGNOSIS — G8929 Other chronic pain: Secondary | ICD-10-CM

## 2016-08-19 DIAGNOSIS — M25572 Pain in left ankle and joints of left foot: Secondary | ICD-10-CM

## 2016-08-19 MED ORDER — DICLOFENAC SODIUM 2 % TD SOLN: 2.0000 g | Freq: Two times a day (BID) | TRANSDERMAL | 5 refills | Status: DC | PRN

## 2016-08-19 NOTE — Progress Notes (Signed)
Office Visit Note   Patient: Brandy Houston           Date of Birth: 01-01-1956           MRN: [id number] Visit Date: 08/19/2016              Requested by: Jamal Maes, MD [address] [place], [place] [address] PCP: Lucrezia Starch, MD   Assessment & Plan: Visit Diagnoses:  1. Pain in left ankle and joints of left foot   2. Chronic pain of left knee     Plan: From the knee standpoint she is doing well. I'll like to see her back in 6 months for repeat 2 view x-rays of the left knee. The ankle I gave her prescription for pennsaid since she cannot take oral NSAIDs due to her severe chronic kidney disease. ASO brace was also given. It's her.  Follow-Up Instructions: Return in about 6 months (around 02/19/2017).   Orders:  Orders Placed This Encounter  Procedures  . XR Knee 1-2 Views Left  . XR Ankle Complete Left   Meds ordered this encounter  Medications  . Diclofenac Sodium 2 % SOLN    Sig: Place 2 g onto the skin 2 (two) times daily as needed.    Dispense:  1 Bottle    Refill:  5      Procedures: No procedures performed   Clinical Data: No additional findings.   Subjective: Chief Complaint  Patient presents with  . Left Knee - Pain  . Left Ankle - Pain    Patient comes in today for follow-up of her knee replacement. She is also complaining of throbbing left ankle pain that does not radiate. Denies any injuries. It makes her sleeping more difficult. Her left knee occasionally pops. She denies any swelling. Denies any constitutional symptoms.    Review of Systems  Constitutional: Negative.   HENT: Negative.   Eyes: Negative.   Respiratory: Negative.   Cardiovascular: Negative.   Endocrine: Negative.   Musculoskeletal: Negative.   Neurological: Negative.   Hematological: Negative.   Psychiatric/Behavioral: Negative.   All other systems reviewed and are negative.    Objective: Vital Signs: LMP 03/16/2002   Physical Exam  Constitutional: She is  oriented to person, place, and time. She appears well-developed and well-nourished.  Pulmonary/Chest: Effort normal.  Neurological: She is alert and oriented to person, place, and time.  Skin: Skin is warm. Capillary refill takes less than 2 seconds.  Psychiatric: She has a normal mood and affect. Her behavior is normal. Judgment and thought content normal.  Nursing note and vitals reviewed.   Ortho Exam Exam of the left knee shows well-healed surgical scar. She has excellent range of motion. Collaterals are stable. No signs of infection. No warmth. Patellar tracking is normal. Exam of the left ankle shows no joint effusion. She has good range of motion. Skin is not warm. Specialty Comments:  No specialty comments available.  Imaging: Xr Ankle Complete Left  Result Date: 08/19/2016 Generalized osteopenia. No acute or structural abnormalities  Xr Knee 1-2 Views Left  Result Date: 08/19/2016 Stable left total knee replacement. Stable lucency under the medial    PMFS History: Patient Active Problem List   Diagnosis Date Noted  . Chronic diastolic CHF (congestive heart failure) ([place]) 11/08/2015  . Uncontrolled type 2 diabetes mellitus with complication ([place])   . Status post total left knee replacement   . Essential hypertension   . Acute blood loss anemia   .  Diabetic peripheral neuropathy ([place])   . Anxiety state   . Depression   . Knee joint replacement status 11/07/2015  . Total knee replacement status 11/07/2015  . Normocytic anemia 11/07/2015  . Diabetes mellitus with complication ([place])   . Chronic kidney disease (CKD), stage IV (severe) ([place])   . Postoperative surgical complication involving circulatory system   . Physical debility 09/14/2014  . Acute renal failure syndrome ([place])   . Hep C w/ coma, chronic ([place])   . Joint infection ([place])   . Acute renal failure ([place])   . Hepatitis C 08/16/2014  . SIRS (systemic inflammatory response syndrome) ([name])   . Benign essential  HTN   . Septic joint of right hand ([place])   . Staphylococcus aureus bacteremia   . Septic joint of right knee joint ([place])   . ESRD (end stage renal disease) on dialysis ([place])   . Tachycardia 08/07/2014  . Septic joint of left shoulder region ([place]) 08/07/2014  . Sinus tachycardia 08/07/2014  . BP (high blood pressure) 06/24/2013  . Disorder of peripheral nervous system ([place]) 06/24/2013  . 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 Diabetes mellitus, type 2 ([place]) 06/24/2013  . Uncontrolled hypertension 02/18/2013  . Type II or unspecified type diabetes mellitus without mention of complication, uncontrolled 02/18/2013  . Chest pain at rest 10/18/2011    Class: Acute  . Shortness of breath 10/18/2011    Class: Acute   Past Medical History:  Diagnosis Date  . Anemia   . Anxiety   . Chronic diastolic CHF (congestive heart failure) ([place])   . Chronic kidney disease    stage IV. previous HD, none currently 10/30/15  . Depression    Chronic  . Diabetes mellitus    type 2, IDDM  . Diabetic peripheral neuropathy ([place])   . Dysrhythmia    tachycardia, normal ECHO 08-09-14  . GERD (gastroesophageal reflux disease)   . Hepatitis C 2016  . Hypertension   . Osteoarthritis of left knee   . Peripheral neuropathy ([place])   . Protein calorie malnutrition ([place])   . Septic arthritis ([place])   . Slow transit constipation   . Unsteady gait     Family History  Problem Relation Age of Onset  . Cancer Mother   . Heart disease Father   . Cancer Sister   . Cancer Brother   . Diabetes Brother     Past Surgical History:  Procedure Laterality Date  . APPENDECTOMY    . AV FISTULA PLACEMENT Left 09/13/2014   Procedure: Brachial Artery to Brachial Vein Gortex Four - Seven Stretch GRAFT INSERTION Left Forearm;  Surgeon: Mal Misty, MD;  Location: [facility];  Service: Vascular;  Laterality: Left;  . CHOLECYSTECTOMY    . KNEE ARTHROSCOPY Right 08/10/2014   Procedure: ARTHROSCOPY I & D KNEE;  Surgeon:  Marianna Payment, MD;  [facility];  Service: Orthopedics;  Laterality: Right;  . KNEE ARTHROSCOPY Left 08/11/2014   Procedure: ARTHROSCOPIC WASHOUT LEFT KNEE;  Surgeon: Marianna Payment, MD;  Location: [facility];  Service: Orthopedics;  Laterality: Left;  . KNEE ARTHROSCOPY Left 08/19/2014   Procedure: ARTHROSCOPIC WASHOUT LEFT KNEE;  Surgeon: Leandrew Koyanagi, MD;  Location: [facility];  Service: Orthopedics;  Laterality: Left;  . KNEE ARTHROSCOPY WITH LATERAL MENISECTOMY Left 04/04/2015   Procedure: AND PARTIAL LATERAL MENISECTOMY;  Surgeon: Leandrew Koyanagi, MD;  Location: [facility];  Service: Orthopedics;  Laterality: Left;  . KNEE ARTHROSCOPY WITH MEDIAL  MENISECTOMY Left 04/04/2015   Procedure: LEFT KNEE ARTHROSCOPY WITH PARTIAL MEDIAL MENISCECTOMY  AND SYNOVECTOMY;  Surgeon: Leandrew Koyanagi, MD;  Location: [facility];  Service: Orthopedics;  Laterality: Left;  . SHOULDER ARTHROSCOPY Bilateral 08/10/2014   Procedure: I & D BILATERAL SHOULDERS ;  Surgeon: Marianna Payment, MD;  [facility];  Service: Orthopedics;  Laterality: Bilateral;  . SMALL INTESTINE SURGERY     Due to Small Bowel Obstruction  . TEE WITHOUT CARDIOVERSION N/A 08/14/2014   Procedure: TRANSESOPHAGEAL ECHOCARDIOGRAM (TEE);  Surgeon: Thayer Headings, MD;  Location: [facility];  Service: Cardiovascular;  Laterality: N/A;  . TENOSYNOVECTOMY Right 08/11/2014   Procedure: RIGHT WRIST IRRIGATION AND DEBRIDEMENT, TENOSYNOVECTOMY;  Surgeon: Marianna Payment, MD;  Location: [facility];  Service: Orthopedics;  Laterality: Right;  . TOTAL KNEE ARTHROPLASTY Left 11/07/2015   Procedure: LEFT TOTAL KNEE ARTHROPLASTY WITH REVISION OF IMPLANTS;  Surgeon: Leandrew Koyanagi, MD;  Location: [facility];  Service: Orthopedics;  Laterality: Left;  . TUBAL LIGATION     Social History   Occupational History  . Not on file.   Social History Main Topics  . Smoking status: Current Every Day Smoker    Packs/day: 0.50    Years: 20.00      Types: Cigarettes  . Smokeless tobacco: Never Used     Comment: Smoking 7-8 cigs per day  . Alcohol use No     Comment: per pt no alcohol since 09/2014  . Drug use: No  . Sexual activity: No

## 2016-09-01 ENCOUNTER — Encounter (HOSPITAL_COMMUNITY)
Admission: RE | Admit: 2016-09-01 | Discharge: 2016-09-01 | Disposition: A | Discharge status: Home / Self Care | Payer: Medicaid Other | Source: Ambulatory Visit | Attending: Nephrology | Admitting: Nephrology

## 2016-09-01 DIAGNOSIS — N184 Chronic kidney disease, stage 4 (severe): Secondary | ICD-10-CM

## 2016-09-01 LAB — POCT HEMOGLOBIN-HEMACUE: Hemoglobin: 11 g/dL — ABNORMAL LOW (ref 12.0–15.0)

## 2016-09-01 MED ORDER — DARBEPOETIN ALFA 200 MCG/0.4ML IJ SOSY
200.0000 ug | PREFILLED_SYRINGE | INTRAMUSCULAR | Status: DC
  Administered 2016-09-01: 200 ug via SUBCUTANEOUS

## 2016-09-01 MED ORDER — AMLODIPINE BESYLATE 5 MG PO TABS
5.0000 mg | ORAL_TABLET | Freq: Every day | ORAL | Status: DC
  Administered 2016-09-01: 5 mg via ORAL
  Filled 2016-09-01: qty 1

## 2016-09-01 MED ORDER — DARBEPOETIN ALFA 200 MCG/0.4ML IJ SOSY
PREFILLED_SYRINGE | INTRAMUSCULAR | Status: AC
  Filled 2016-09-01: qty 0.4

## 2016-09-01 NOTE — Progress Notes (Signed)
Spoke with Dr [name], told her about pt's high blood pressure.  She instructed [place] to give 5 MG of Norvasc, to go ahead and give the patient her Shot as she has worsening kidney disease.  She also said that  Kathie Rhodes will be calling in a prescription for Norvasc for the patient at her pharmacy.  I Verbally explained all of this to the patient and she verbal stated that she understood her instructions.

## 2016-09-01 NOTE — Progress Notes (Signed)
Called and spoke to [place] at [facility] about patient's BP being in the 200's today even after Dr [name] adjusting her meds a couple of weeks ago.  Pt stated she took her meds today.  Doristine Bosworth asked me to page Dr [name].  Awaiting return call.

## 2016-09-29 ENCOUNTER — Encounter (HOSPITAL_COMMUNITY)
Admission: RE | Admit: 2016-09-29 | Discharge: 2016-09-29 | Disposition: A | Discharge status: Home / Self Care | Payer: Medicaid Other | Source: Ambulatory Visit | Attending: Nephrology | Admitting: Nephrology

## 2016-09-29 DIAGNOSIS — D631 Anemia in chronic kidney disease: Secondary | ICD-10-CM | POA: Insufficient documentation

## 2016-09-29 DIAGNOSIS — N184 Chronic kidney disease, stage 4 (severe): Secondary | ICD-10-CM | POA: Diagnosis present

## 2016-09-29 LAB — IRON AND TIBC
Iron: 54 ug/dL (ref 28–170)
Saturation Ratios: 16 % (ref 10.4–31.8)
TIBC: 335 ug/dL (ref 250–450)
UIBC: 281 ug/dL

## 2016-09-29 LAB — POCT HEMOGLOBIN-HEMACUE: Hemoglobin: 11.9 g/dL — ABNORMAL LOW (ref 12.0–15.0)

## 2016-09-29 LAB — FERRITIN: Ferritin: 253 ng/mL (ref 11–307)

## 2016-09-29 MED ORDER — DARBEPOETIN ALFA 200 MCG/0.4ML IJ SOSY
PREFILLED_SYRINGE | INTRAMUSCULAR | Status: AC
Start: 2016-09-29 — End: 2016-09-29
  Filled 2016-09-29: qty 0.4

## 2016-09-29 MED ORDER — DARBEPOETIN ALFA 200 MCG/0.4ML IJ SOSY
200.0000 ug | PREFILLED_SYRINGE | INTRAMUSCULAR | Status: DC
Start: 2016-09-29 — End: 2016-09-30
  Administered 2016-09-29: 200 ug via SUBCUTANEOUS

## 2016-10-27 ENCOUNTER — Encounter (HOSPITAL_COMMUNITY)
Admission: RE | Admit: 2016-10-27 | Discharge: 2016-10-27 | Disposition: A | Discharge status: Home / Self Care | Payer: Medicaid Other | Source: Ambulatory Visit | Attending: Nephrology | Admitting: Nephrology

## 2016-10-27 DIAGNOSIS — N184 Chronic kidney disease, stage 4 (severe): Secondary | ICD-10-CM | POA: Diagnosis not present

## 2016-10-27 DIAGNOSIS — D631 Anemia in chronic kidney disease: Secondary | ICD-10-CM | POA: Insufficient documentation

## 2016-10-27 LAB — POCT HEMOGLOBIN-HEMACUE: Hemoglobin: 11.6 g/dL — ABNORMAL LOW (ref 12.0–15.0)

## 2016-10-27 MED ORDER — DARBEPOETIN ALFA 100 MCG/0.5ML IJ SOSY: 100.0000 ug | PREFILLED_SYRINGE | INTRAMUSCULAR | Status: DC

## 2016-10-27 MED ORDER — DARBEPOETIN ALFA 200 MCG/0.4ML IJ SOSY
PREFILLED_SYRINGE | INTRAMUSCULAR | Status: AC
  Administered 2016-10-27: 200 ug via SUBCUTANEOUS
  Filled 2016-10-27: qty 0.4

## 2016-11-02 ENCOUNTER — Other Ambulatory Visit (INDEPENDENT_AMBULATORY_CARE_PROVIDER_SITE_OTHER): Payer: Self-pay | Admitting: Orthopaedic Surgery

## 2016-11-10 ENCOUNTER — Ambulatory Visit (INDEPENDENT_AMBULATORY_CARE_PROVIDER_SITE_OTHER): Payer: Medicaid Other | Admitting: Orthopaedic Surgery

## 2016-11-11 ENCOUNTER — Ambulatory Visit (INDEPENDENT_AMBULATORY_CARE_PROVIDER_SITE_OTHER): Payer: Medicaid Other | Admitting: Orthopaedic Surgery

## 2016-11-18 ENCOUNTER — Ambulatory Visit: Payer: Medicare Other | Admitting: Obstetrics and Gynecology

## 2016-11-26 ENCOUNTER — Encounter (HOSPITAL_COMMUNITY)
Admission: RE | Admit: 2016-11-26 | Discharge: 2016-11-26 | Disposition: A | Discharge status: Home / Self Care | Payer: Medicaid Other | Source: Ambulatory Visit | Attending: Nephrology | Admitting: Nephrology

## 2016-11-26 DIAGNOSIS — D631 Anemia in chronic kidney disease: Secondary | ICD-10-CM | POA: Diagnosis present

## 2016-11-26 DIAGNOSIS — N184 Chronic kidney disease, stage 4 (severe): Secondary | ICD-10-CM

## 2016-11-26 LAB — IRON AND TIBC
Iron: 71 ug/dL (ref 28–170)
Saturation Ratios: 21 % (ref 10.4–31.8)
TIBC: 333 ug/dL (ref 250–450)
UIBC: 262 ug/dL

## 2016-11-26 LAB — POCT HEMOGLOBIN-HEMACUE: Hemoglobin: 13.2 g/dL (ref 12.0–15.0)

## 2016-11-26 LAB — FERRITIN: Ferritin: 413 ng/mL — ABNORMAL HIGH (ref 11–307)

## 2016-11-26 MED ORDER — DARBEPOETIN ALFA 100 MCG/0.5ML IJ SOSY: 100.0000 ug | PREFILLED_SYRINGE | INTRAMUSCULAR | Status: DC

## 2016-11-27 ENCOUNTER — Other Ambulatory Visit: Payer: Self-pay | Admitting: Family Medicine

## 2016-11-27 DIAGNOSIS — N6320 Unspecified lump in the left breast, unspecified quadrant: Secondary | ICD-10-CM

## 2016-12-01 ENCOUNTER — Ambulatory Visit (INDEPENDENT_AMBULATORY_CARE_PROVIDER_SITE_OTHER): Payer: Medicaid Other | Admitting: Orthopaedic Surgery

## 2016-12-01 ENCOUNTER — Encounter (INDEPENDENT_AMBULATORY_CARE_PROVIDER_SITE_OTHER): Payer: Self-pay | Admitting: Orthopaedic Surgery

## 2016-12-01 ENCOUNTER — Ambulatory Visit (INDEPENDENT_AMBULATORY_CARE_PROVIDER_SITE_OTHER): Payer: Medicaid Other

## 2016-12-01 DIAGNOSIS — G8929 Other chronic pain: Secondary | ICD-10-CM | POA: Diagnosis not present

## 2016-12-01 DIAGNOSIS — M25562 Pain in left knee: Secondary | ICD-10-CM | POA: Diagnosis not present

## 2016-12-01 MED ORDER — HYDROCODONE-ACETAMINOPHEN 5-325 MG PO TABS: 1.0000 | ORAL_TABLET | Freq: Every day | ORAL | 0 refills | Status: DC | PRN

## 2016-12-01 NOTE — Progress Notes (Signed)
Office Visit Note   Patient: Brandy Houston           Date of Birth: [birth year]           MRN: [id number] Visit Date: 12/01/2016              Requested by: Jamal Maes, MD [address] [place], [place] [address] PCP: Jamal Maes, MD   Assessment & Plan: Visit Diagnoses:  1. Chronic pain of left knee     Plan: I have aspirated her knee multiple times now and each time it has been a bloody effusion. The patient has chronic kidney disease. 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 I cannot identify any orthopedic issues as to why she continues to have these bloody effusions. I feel that she has some sort of coagulopathy that is causing this. I sent a referral to hematologist for further evaluation. I will like to see her back in about 6 months. Norco was refilled today.  Follow-Up Instructions: Return in about 6 months (around 06/02/2017).   Orders:  Orders Placed This Encounter  Procedures  . XR Knee 1-2 Views Left  . Ambulatory referral to Hematology   Meds ordered this encounter  Medications  . HYDROcodone-acetaminophen (NORCO) 5-325 MG tablet    Sig: Take 1-2 tablets by mouth daily as needed.    Dispense:  30 tablet    Refill:  0      Procedures: No procedures performed   Clinical Data: No additional findings.   Subjective: Chief Complaint  Patient presents with  . Left Knee - Pain, Follow-up    Wynette is 13 months status post left total knee replacement. She is complaining of patellar instability. She is otherwise doing well. She endorses swelling of her knee.    Review of Systems  Constitutional: Negative.   HENT: Negative.   Eyes: Negative.   Respiratory: Negative.   Cardiovascular: Negative.   Endocrine: Negative.   Musculoskeletal: Negative.   Neurological: Negative.   Hematological: Negative.   Psychiatric/Behavioral: Negative.   All other systems reviewed and are negative.    Objective: Vital Signs: LMP  03/16/2002   Physical Exam  Constitutional: She is oriented to person, place, and time. She appears well-developed and well-nourished.  HENT:  Head: Normocephalic and atraumatic.  Eyes: EOM are normal.  Neck: Neck supple.  Pulmonary/Chest: Effort normal.  Abdominal: Soft.  Neurological: She is alert and oriented to person, place, and time.  Skin: Skin is warm. Capillary refill takes less than 2 seconds.  Psychiatric: She has a normal mood and affect. Her behavior is normal. Judgment and thought content normal.  Nursing note and vitals reviewed.   Ortho Exam Left knee shows a fully healed scar. She does have a large joint effusion. There is no warmth or evidence of infection. Specialty Comments:  No specialty comments available.  Imaging: Xr Knee 1-2 Views Left  Result Date: 12/01/2016 Stable total knee replacement. There is some mild resorption of the medial tibial plateau. No evidence of loosening.    PMFS History: Patient Active Problem List   Diagnosis Date Noted  . Chronic diastolic CHF (congestive heart failure) ([facility]) 11/08/2015  . Uncontrolled type 2 diabetes mellitus with complication ([facility])   . Status post total left knee replacement   . Essential hypertension   . Acute blood loss anemia   . Diabetic peripheral neuropathy ([facility])   . Anxiety state   . Depression   . Knee joint replacement status  11/07/2015  . Total knee replacement status 11/07/2015  . Normocytic anemia 11/07/2015  . Diabetes mellitus with complication ([facility])   . Chronic kidney disease (CKD), stage IV (severe) ([facility])   . Postoperative surgical complication involving circulatory system   . Physical debility 09/14/2014  . Acute renal failure syndrome ([facility])   . Hep C w/ coma, chronic ([facility])   . Joint infection ([facility])   . Acute renal failure ([facility])   . Hepatitis C 08/16/2014  . SIRS (systemic inflammatory response syndrome) ([name])   . Benign essential HTN   . Septic joint of right hand ([facility])   .  Staphylococcus aureus bacteremia   . Septic joint of right knee joint ([facility])   . ESRD (end stage renal disease) on dialysis ([name])   . Tachycardia 08/07/2014  . Septic joint of left shoulder region ([facility]) 08/07/2014  . Sinus tachycardia 08/07/2014  . BP (high blood pressure) 06/24/2013  . Disorder of peripheral nervous system 06/24/2013  . 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 Diabetes mellitus, type 2 ([facility]) 06/24/2013  . Uncontrolled hypertension 02/18/2013  . Type II or unspecified type diabetes mellitus without mention of complication, uncontrolled 02/18/2013  . Chest pain at rest 10/18/2011    Class: Acute  . Shortness of breath 10/18/2011    Class: Acute   Past Medical History:  Diagnosis Date  . Anemia   . Anxiety   . Chronic diastolic CHF (congestive heart failure) ([facility])   . Chronic kidney disease    stage IV. previous HD, none currently 10/30/15  . Depression    Chronic  . Diabetes mellitus    type 2, IDDM  . Diabetic peripheral neuropathy ([facility])   . Dysrhythmia    tachycardia, normal ECHO 08-09-14  . GERD (gastroesophageal reflux disease)   . Hepatitis C 2016  . Hypertension   . Osteoarthritis of left knee   . Peripheral neuropathy   . Protein calorie malnutrition ([facility])   . Septic arthritis ([facility])   . Slow transit constipation   . Unsteady gait     Family History  Problem Relation Age of Onset  . Cancer Mother   . Heart disease Father   . Cancer Sister   . Cancer Brother   . Diabetes Brother     Past Surgical History:  Procedure Laterality Date  . APPENDECTOMY    . AV FISTULA PLACEMENT Left 09/13/2014   Procedure: Brachial Artery to Brachial Vein Gortex Four - Seven Stretch GRAFT INSERTION Left Forearm;  Surgeon: Mal Misty, MD;  Location: [facility];  Service: Vascular;  Laterality: Left;  . CHOLECYSTECTOMY    . KNEE ARTHROSCOPY Right 08/10/2014   Procedure: ARTHROSCOPY I & D KNEE;  Surgeon: Marianna Payment, MD;  Location: [facility];  Service:  Orthopedics;  Laterality: Right;  . KNEE ARTHROSCOPY Left 08/11/2014   Procedure: ARTHROSCOPIC WASHOUT LEFT KNEE;  Surgeon: Marianna Payment, MD;  Location: [facility];  Service: Orthopedics;  Laterality: Left;  . KNEE ARTHROSCOPY Left 08/19/2014   Procedure: ARTHROSCOPIC WASHOUT LEFT KNEE;  Surgeon: Leandrew Koyanagi, MD;  Location: [facility];  Service: Orthopedics;  Laterality: Left;  . KNEE ARTHROSCOPY WITH LATERAL MENISECTOMY Left 04/04/2015   Procedure: AND PARTIAL LATERAL MENISECTOMY;  Surgeon: Leandrew Koyanagi, MD;  Location: [facility];  Service: Orthopedics;  Laterality: Left;  . KNEE ARTHROSCOPY WITH MEDIAL MENISECTOMY Left 04/04/2015   Procedure: LEFT KNEE ARTHROSCOPY WITH PARTIAL MEDIAL MENISCECTOMY  AND SYNOVECTOMY;  Surgeon: Leandrew Koyanagi, MD;  Location: [facility];  Service: Orthopedics;  Laterality: Left;  . SHOULDER ARTHROSCOPY Bilateral 08/10/2014   Procedure: I & D BILATERAL SHOULDERS ;  Surgeon: Marianna Payment, MD;  Location: [facility];  Service: Orthopedics;  Laterality: Bilateral;  . SMALL INTESTINE SURGERY     Due to Small Bowel Obstruction  . TEE WITHOUT CARDIOVERSION N/A 08/14/2014   Procedure: TRANSESOPHAGEAL ECHOCARDIOGRAM (TEE);  Surgeon: Thayer Headings, MD;  Location: [facility];  Service: Cardiovascular;  Laterality: N/A;  . TENOSYNOVECTOMY Right 08/11/2014   Procedure: RIGHT WRIST IRRIGATION AND DEBRIDEMENT, TENOSYNOVECTOMY;  Surgeon: Marianna Payment, MD;  Location: [facility];  Service: Orthopedics;  Laterality: Right;  . TOTAL KNEE ARTHROPLASTY Left 11/07/2015   Procedure: LEFT TOTAL KNEE ARTHROPLASTY WITH REVISION OF IMPLANTS;  Surgeon: Leandrew Koyanagi, MD;  Location: [facility];  Service: Orthopedics;  Laterality: Left;  . TUBAL LIGATION     Social History   Occupational History  . Not on file.   Social History Main Topics  . Smoking status: Current Every Day Smoker    Packs/day: 0.50    Years: 20.00    Types: Cigarettes  . Smokeless tobacco: Never  Used     Comment: Smoking 7-8 cigs per day  . Alcohol use No     Comment: per pt no alcohol since 09/2014  . Drug use: No  . Sexual activity: No

## 2016-12-09 ENCOUNTER — Ambulatory Visit (INDEPENDENT_AMBULATORY_CARE_PROVIDER_SITE_OTHER): Payer: Medicaid Other | Admitting: Obstetrics and Gynecology

## 2016-12-09 ENCOUNTER — Encounter (HOSPITAL_COMMUNITY)
Admission: RE | Admit: 2016-12-09 | Discharge: 2016-12-09 | Disposition: A | Discharge status: Home / Self Care | Payer: Medicaid Other | Source: Ambulatory Visit | Attending: Nephrology | Admitting: Nephrology

## 2016-12-09 ENCOUNTER — Encounter: Payer: Self-pay | Admitting: Obstetrics and Gynecology

## 2016-12-09 DIAGNOSIS — R87810 Cervical high risk human papillomavirus (HPV) DNA test positive: Secondary | ICD-10-CM | POA: Diagnosis not present

## 2016-12-09 DIAGNOSIS — R87610 Atypical squamous cells of undetermined significance on cytologic smear of cervix (ASC-US): Secondary | ICD-10-CM | POA: Diagnosis not present

## 2016-12-09 DIAGNOSIS — N184 Chronic kidney disease, stage 4 (severe): Secondary | ICD-10-CM

## 2016-12-09 LAB — POCT HEMOGLOBIN-HEMACUE: Hemoglobin: 10.9 g/dL — ABNORMAL LOW (ref 12.0–15.0)

## 2016-12-09 MED ORDER — DARBEPOETIN ALFA 100 MCG/0.5ML IJ SOSY
PREFILLED_SYRINGE | INTRAMUSCULAR | Status: AC
  Filled 2016-12-09: qty 0.5

## 2016-12-09 MED ORDER — DARBEPOETIN ALFA 100 MCG/0.5ML IJ SOSY
100.0000 ug | PREFILLED_SYRINGE | INTRAMUSCULAR | Status: DC
  Administered 2016-12-09: 100 ug via SUBCUTANEOUS

## 2016-12-09 NOTE — Progress Notes (Signed)
Patient is in the office for GYN visit. Pt states that she just took BP meds before coming to appt.

## 2016-12-09 NOTE — Progress Notes (Signed)
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  Placed in lithotomy position. Cervix viewed with speculum and colposcope after application of acetic acid.   Colposcopy adequate?  yes Acetowhite lesions? Yes at 12 and 6 o'clock Punctation? no Mosaicism?  no Abnormal vasculature?  no Biopsies? Yes at 12 and 6 o;clock ECC? yes  COMMENTS:  Patient was given post procedure instructions.  She will return in 2 weeks for results.  Mora Bellman, MD

## 2016-12-10 ENCOUNTER — Encounter: Payer: Self-pay | Admitting: *Deleted

## 2016-12-12 ENCOUNTER — Encounter: Payer: Self-pay | Admitting: Hematology and Oncology

## 2016-12-12 ENCOUNTER — Telehealth: Payer: Self-pay | Admitting: Hematology and Oncology

## 2016-12-12 ENCOUNTER — Telehealth: Payer: Self-pay

## 2016-12-12 NOTE — Telephone Encounter (Signed)
Pt aware colposcopy results are worst than pap; therefore, we need to proceed with LEEP. Pt currently out of down and not available for an appt until after July 17th. Appts notified.

## 2016-12-12 NOTE — Telephone Encounter (Signed)
Appt has been scheduled for the pt to see Dr. Lebron Conners on 7/16 at 11am. Pt stated she is out of out of town until 7/11. She agreed to the 7/16 appt at 11am. Letter mailed to the pt.

## 2016-12-12 NOTE — Telephone Encounter (Signed)
-----   Message from Mora Bellman, MD sent at 12/12/2016 10:50 AM EDT ----- Please inform patient of colposcopy biopsy results worst than pap smear. She will need to have a LEEP procedure. Please schedule appointment  Thanks  Peggy

## 2016-12-29 ENCOUNTER — Encounter: Payer: Self-pay | Admitting: Hematology and Oncology

## 2016-12-29 ENCOUNTER — Ambulatory Visit (HOSPITAL_BASED_OUTPATIENT_CLINIC_OR_DEPARTMENT_OTHER): Payer: Medicaid Other | Admitting: Hematology and Oncology

## 2016-12-29 ENCOUNTER — Telehealth: Payer: Self-pay | Admitting: Hematology and Oncology

## 2016-12-29 ENCOUNTER — Other Ambulatory Visit: Payer: Self-pay | Admitting: Hematology and Oncology

## 2016-12-29 ENCOUNTER — Other Ambulatory Visit (HOSPITAL_COMMUNITY)
Admission: RE | Admit: 2016-12-29 | Discharge: 2016-12-29 | Disposition: A | Discharge status: Home / Self Care | Payer: Medicaid Other | Source: Other Acute Inpatient Hospital | Attending: Hematology and Oncology | Admitting: Hematology and Oncology

## 2016-12-29 VITALS — BP 152/78 | HR 78 | Temp 98.6°F | Resp 18 | Ht 67.0 in | Wt 146.3 lb

## 2016-12-29 DIAGNOSIS — M25062 Hemarthrosis, left knee: Secondary | ICD-10-CM | POA: Insufficient documentation

## 2016-12-29 DIAGNOSIS — R778 Other specified abnormalities of plasma proteins: Secondary | ICD-10-CM | POA: Insufficient documentation

## 2016-12-29 LAB — COMPREHENSIVE METABOLIC PANEL
ALT: 22 U/L (ref 0–55)
AST: 16 U/L (ref 5–34)
Albumin: 3.9 g/dL (ref 3.5–5.0)
Alkaline Phosphatase: 148 U/L (ref 40–150)
Anion Gap: 14 mEq/L — ABNORMAL HIGH (ref 3–11)
BUN: 97.6 mg/dL — ABNORMAL HIGH (ref 7.0–26.0)
CO2: 23 mEq/L (ref 22–29)
Calcium: 9.8 mg/dL (ref 8.4–10.4)
Chloride: 101 mEq/L (ref 98–109)
Creatinine: 3.7 mg/dL (ref 0.6–1.1)
EGFR: 15 mL/min/{1.73_m2} — ABNORMAL LOW (ref >90)
Glucose: 163 mg/dl — ABNORMAL HIGH (ref 70–140)
Potassium: 3.6 mEq/L (ref 3.5–5.1)
Sodium: 137 mEq/L (ref 136–145)
Total Bilirubin: 0.33 mg/dL (ref 0.20–1.20)
Total Protein: 9.1 g/dL — ABNORMAL HIGH (ref 6.4–8.3)

## 2016-12-29 LAB — CBC WITH DIFFERENTIAL/PLATELET
BASO%: 0.3 % (ref 0.0–2.0)
Basophils Absolute: 0 10*3/uL (ref 0.0–0.1)
EOS%: 1.3 % (ref 0.0–7.0)
Eosinophils Absolute: 0.1 10*3/uL (ref 0.0–0.5)
HCT: 35.7 % (ref 34.8–46.6)
HGB: 11.7 g/dL (ref 11.6–15.9)
LYMPH%: 21.4 % (ref 14.0–49.7)
MCH: 28.5 pg (ref 25.1–34.0)
MCHC: 32.8 g/dL (ref 31.5–36.0)
MCV: 87.1 fL (ref 79.5–101.0)
MONO#: 0.6 10*3/uL (ref 0.1–0.9)
MONO%: 8 % (ref 0.0–14.0)
NEUT#: 5.3 10*3/uL (ref 1.5–6.5)
NEUT%: 69 % (ref 38.4–76.8)
Platelets: 280 10*3/uL (ref 145–400)
RBC: 4.1 10*6/uL (ref 3.70–5.45)
RDW: 17.4 % — ABNORMAL HIGH (ref 11.2–14.5)
WBC: 7.6 10*3/uL (ref 3.9–10.3)
lymph#: 1.6 10*3/uL (ref 0.9–3.3)

## 2016-12-29 LAB — PLATELET FUNCTION ASSAY
Collagen / ADP: 173 seconds — ABNORMAL HIGH (ref 0–118)
Collagen / Epinephrine: 285 seconds — ABNORMAL HIGH (ref 0–193)

## 2016-12-29 NOTE — Progress Notes (Signed)
[facility] New Visit:  Assessment: Hemarthrosis, left knee 61 year old female with multiple comorbidities including history of hepatitis C infection, treated with most recent undetectable level of the virus, history of sepsis with multi-joint septic arthritis including bilateral knee infections. Subsequent progression of osteoarthritis in the left knee requiring total knee replacement in May 2017. Postoperatively, recurrent hemorrhagic joint effusions preventing normal function of the joint requiring repeat aspirations. Referred for consultation due to possible presence of coagulopathy contributing to the persistent joint effusions.  On review of the lab work, no recent labs to assess for coagulopathy are available. Previous values are not consistent with coagulopathy. Patient's history offers no other signs of dysfunctional clotting. Patient has no history of disorder and no family history of hemophilia, von Willebrand's disease, or other hematological conditions.  Differential diagnosis includes acquired coagulopathy versus local problems such as of normal vascular pattern or AVM formation resulting in localized bleeding. Last MRI imaging is from September 2016 without any abnormality to explain the current presentation.  Plan: --Labs today: CBC/diff, CMP, INR, aPTT -- initial laboratory demonstrating a significant elevation of total protein with normal level of albumin. This is concerning for possible presence of monoclonal gammopathy and will be further evaluated with SPEP, SIFE, light chain assay. We will repeat HCV Ab with reflex titers. --RTC 1 week to review.    Orders Placed This Encounter  Procedures  . CBC with Differential    Standing Status:   Future    Number of Occurrences:   1    Standing Expiration Date:   12/29/2017  . Comprehensive metabolic panel    Standing Status:   Future    Number of Occurrences:   1    Standing Expiration Date:   12/29/2017  .  APTT    Standing Status:   Future    Number of Occurrences:   1    Standing Expiration Date:   12/29/2017  . Protime-INR    Standing Status:   Future    Number of Occurrences:   1    Standing Expiration Date:   12/29/2017  . Platelet function assay    Standing Status:   Future    Number of Occurrences:   1    Standing Expiration Date:   12/29/2017  . Fibrinogen    Standing Status:   Future    Number of Occurrences:   1    Standing Expiration Date:   12/29/2017  . Von Willebrand panel    Standing Status:   Future    Number of Occurrences:   1    Standing Expiration Date:   12/29/2017  . Multiple Myeloma Panel (SPEP&IFE w/QIG)    Standing Status:   Future    Standing Expiration Date:   12/29/2017  . Kappa/lambda light chains    Standing Status:   Future    Standing Expiration Date:   12/29/2017  . Viscosity, serum    Standing Status:   Future    Standing Expiration Date:   12/29/2017  . Hepatitis C antibody (reflex if positive)    Standing Status:   Future    Standing Expiration Date:   12/29/2017    All questions were answered. . The patient knows to call the clinic with any problems, questions or concerns.  This note was electronically signed.    History of Presenting Illness Brandy Houston 61 y.o. presenting to the [facility] for possible coagulopathy in the setting of recurrent hemarthrosis. Patient was referred  by Dr Leandrew Koyanagi who follows the patient for recurrent left knee effusions and patellar instability precipitated by those effusions. Patient reports that the problem with the left knee started approximately 2 years ago she suffered a severe systemic infection with MSSA which included development of the bilateral septic knee joint, bilateral AC joint infections as well as right hand flexor tendon sheaths infection. The shunt underwent extensive surgical debridement in Feb 2016. Including at least 2 surgeries on the left knee. 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  In May 2017, patient underwent a left knee arthroplasty due to severe osteoarthritis. Since that time, patient has suffered from recurrent effusions left knee joint and requires at least 3 thoracentesis procedures with hemorrhagic fluid present every time. Effusion keeps reaccumulating and appears to be reducing patient's mobility and ability to participate in the physical therapy. Most recent note from nephrology date 09/15/16 demonstrates anasarca, likely due to severe proteinuria and subsequent fluid retention. Your important clearance is over 8 g at the time. Patient also has a history of hepatitis C infection detected in February 2016 treated with antiviral therapy. Initial viral titers exceeded 8 million copies. Most recent evaluation in December 2016 demonstrated undetectable levels of viral RNA.  On additional questioning, patient also indicates easy bruisability the past 2 years coinciding with onset of the above-mentioned infection. She denies history of cutaneous hematomas. She has never had any bleeding in any of the other joints that she is aware of. Currently, patient is postmenopausal, but when she was having her regular periods, she has never had heavy periods or. Severe enough to cause anemia requiring transfusions. Patient denies any known family history of hematological disorders or bleeding propensity. At the present time, patient denies epistaxis, gum bleeding, hemoptysis, hematemesis, melena, hematochezia, vaginal bleeding or hematuria. Denies any fever, chills or night sweats. No swollen glands in the neck, armpits, or groin. No early satiety, dysphagia, nausea, or vomiting. No abdominal pain.   Oncological/hematological History: **Lt knee hemarthrosis, recurrent: --Labs, Mar 2016: SPEP -- no evidence of monoclonal protein; Cryoglobulin -- negative. --MRI Lt Knee, 03/16/15: Large joint  effusion and severe synovitis. Moderately-size Baker cyst with debris and synovitis. Extensive erosive changes in the tibia, femur, and patella. Severe diffuse cellulitis and synovitis. --Labs, 11/07/15: INR 1.51, aPTT 38, Fibrinogen 231; --Lt Knee Xray, 11/07/15: Total knee arthroplasty without periprosthetic fracture or subluxation. --Labs, 01/27/16: Cr 2.23, BUN 34; WBC 7.3, Hgb 8.8, Plt 214;  --Labs, 11/26/16: Fe 71, FeSat 21%, TIBC 333, Ferritin 413  Medical History: Past Medical History:  Diagnosis Date  . Anemia   . Anxiety   . Chronic diastolic CHF (congestive heart failure) ([place])   . Chronic kidney disease    stage IV. previous HD, none currently 10/30/15  . Depression    Chronic  . Diabetes mellitus    type 2, IDDM  . Diabetic peripheral neuropathy ([place])   . Dysrhythmia    tachycardia, normal ECHO 08-09-14  . GERD (gastroesophageal reflux disease)   . Hepatitis C 2016  . Hypertension   . Osteoarthritis of left knee   . Peripheral neuropathy   . Protein calorie malnutrition ([place])   . Septic arthritis ([place])   . Slow transit constipation   . Unsteady gait     Surgical History: Past Surgical History:  Procedure Laterality Date  . APPENDECTOMY    . AV FISTULA PLACEMENT Left 09/13/2014   Procedure: Brachial  Artery to Brachial Vein Gortex Four - Seven Stretch GRAFT INSERTION Left Forearm;  Surgeon: Mal Misty, MD;  Location: [facility];  Service: Vascular;  Laterality: Left;  . CHOLECYSTECTOMY    . KNEE ARTHROSCOPY Right 08/10/2014   Procedure: ARTHROSCOPY I & D KNEE;  Surgeon: Marianna Payment, MD;  Location: [facility];  Service: Orthopedics;  Laterality: Right;  . KNEE ARTHROSCOPY Left 08/11/2014   Procedure: ARTHROSCOPIC WASHOUT LEFT KNEE;  Surgeon: Marianna Payment, MD;  Location: [facility];  Service: Orthopedics;  Laterality: Left;  . KNEE ARTHROSCOPY Left 08/19/2014   Procedure: ARTHROSCOPIC WASHOUT LEFT KNEE;  Surgeon: Leandrew Koyanagi, MD;  Location: [facility];  Service:  Orthopedics;  Laterality: Left;  . KNEE ARTHROSCOPY WITH LATERAL MENISECTOMY Left 04/04/2015   Procedure: AND PARTIAL LATERAL MENISECTOMY;  Surgeon: Leandrew Koyanagi, MD;  Location: [facility];  Service: Orthopedics;  Laterality: Left;  . KNEE ARTHROSCOPY WITH MEDIAL MENISECTOMY Left 04/04/2015   Procedure: LEFT KNEE ARTHROSCOPY WITH PARTIAL MEDIAL MENISCECTOMY  AND SYNOVECTOMY;  Surgeon: Leandrew Koyanagi, MD;  Location: [facility];  Service: Orthopedics;  Laterality: Left;  . SHOULDER ARTHROSCOPY Bilateral 08/10/2014   Procedure: I & D BILATERAL SHOULDERS ;  Surgeon: Marianna Payment, MD;  Location: [facility];  Service: Orthopedics;  Laterality: Bilateral;  . SMALL INTESTINE SURGERY     Due to Small Bowel Obstruction  . TEE WITHOUT CARDIOVERSION N/A 08/14/2014   Procedure: TRANSESOPHAGEAL ECHOCARDIOGRAM (TEE);  Surgeon: Thayer Headings, MD;  Location: [facility];  Service: Cardiovascular;  Laterality: N/A;  . TENOSYNOVECTOMY Right 08/11/2014   Procedure: RIGHT WRIST IRRIGATION AND DEBRIDEMENT, TENOSYNOVECTOMY;  Surgeon: Marianna Payment, MD;  Location: [facility];  Service: Orthopedics;  Laterality: Right;  . TOTAL KNEE ARTHROPLASTY Left 11/07/2015   Procedure: LEFT TOTAL KNEE ARTHROPLASTY WITH REVISION OF IMPLANTS;  Surgeon: Leandrew Koyanagi, MD;  Location: [facility];  Service: Orthopedics;  Laterality: Left;  . TUBAL LIGATION      Family History: Family History  Problem Relation Age of Onset  . Cancer Mother   . Heart disease Father   . Cancer Sister   . Hypertension Sister   . Cancer Brother   . Diabetes Brother   . Hypertension Brother     Social History: Social History   Social History  . Marital status: Single    Spouse name: N/A  . Number of children: N/A  . Years of education: N/A   Occupational History  . Not on file.   Social History Main Topics  . Smoking status: Current Every Day Smoker    Packs/day: 0.50    Years: 20.00    Types: Cigarettes  .  Smokeless tobacco: Never Used     Comment: Smoking 7-8 cigs per day  . Alcohol use No     Comment: per pt no alcohol since 09/2014  . Drug use: No  . Sexual activity: No   Other Topics Concern  . Not on file   Social History Narrative  . No narrative on file    Allergies: Allergies  Allergen Reactions  . Compazine [Prochlorperazine] Shortness Of Breath and Swelling    TONGUE SWELLS  . Shellfish-Derived Products Anaphylaxis  . Iodinated Diagnostic Agents Hives and Rash  . Omnipaque [Iohexol] Hives    Medications:  Current Outpatient Prescriptions  Medication Sig Dispense Refill  . acetaminophen (TYLENOL) 500 MG tablet Take 1,000 mg by mouth every 6 (six) hours as needed for mild pain.    [name]  amLODipine (NORVASC) 10 MG tablet Take 10 mg by mouth daily.    [name] aspirin 81 MG chewable tablet Chew 81 mg by mouth daily.     . Blood Glucose Monitoring Suppl (ACCU-CHEK AVIVA PLUS) W/DEVICE KIT Check sugars TID for E11.65 1 kit 0  . calcium carbonate (TUMS EX) 750 MG chewable tablet Chew 1 tablet by mouth daily.    . Diclofenac Sodium 2 % SOLN Place 2 g onto the skin 2 (two) times daily as needed. 1 Bottle 5  . diphenhydrAMINE (BENADRYL) 25 mg capsule Take 1 capsule (25 mg total) by mouth every 6 (six) hours as needed for itching. 30 capsule 0  . ferrous sulfate 325 (65 FE) MG tablet Take 325 mg by mouth 2 (two) times daily with a meal.    . furosemide (LASIX) 80 MG tablet Take 160 mg by mouth 3 (three) times daily.     [name] gabapentin (NEURONTIN) 100 MG capsule Take 1 capsule (100 mg total) by mouth 2 (two) times daily. 60 capsule 2  . glucose blood (ACCU-CHEK AVIVA) test strip Check sugars TID for E11.65 100 each 12  . hydrALAZINE (APRESOLINE) 100 MG tablet Take 1 tablet by mouth 2 (two) times daily.    . insulin glargine (LANTUS) 100 UNIT/ML injection Inject 5 Units into the skin daily.    [name] Devices (ACCU-CHEK SOFTCLIX) lancets Check sugars TID for E11.65 1 each 10  . methocarbamol  (ROBAXIN) 500 MG tablet TAKE 1 TABLET BY MOUTH EVERY 6 HOURS AS NEEDED FOR MUSCLE SPASM 30 tablet 2  . methocarbamol (ROBAXIN) 750 MG tablet Take 1 tablet (750 mg total) by mouth 2 (two) times daily as needed for muscle spasms. 60 tablet 0  . metoprolol succinate (TOPROL-XL) 50 MG 24 hr tablet Take 1 tablet by mouth 2 (two) times daily.     . Multiple Vitamin (MULTIVITAMIN WITH MINERALS) TABS tablet Take 1 tablet by mouth daily.    [name] omeprazole (PRILOSEC) 20 MG capsule Take 1 capsule by mouth every morning.     . ondansetron (ZOFRAN) 4 MG tablet Take 1-2 tablets (4-8 mg total) by mouth every 8 (eight) hours as needed for nausea or vomiting. 40 tablet 0  . Potassium Chloride ER 20 MEQ TBCR Take 1 tablet by mouth 2 (two) times daily.     . sennosides-docusate sodium (SENOKOT-S) 8.6-50 MG tablet Take 2 tablets by mouth at bedtime.    . sertraline (ZOLOFT) 100 MG tablet Take 1 tablet (100 mg total) by mouth daily. 30 tablet 6   No current facility-administered medications for this visit.     Review of Systems: Review of Systems  All other systems reviewed and are negative.    PHYSICAL EXAMINATION Blood pressure (!) 152/78, pulse 78, temperature 98.6 F (37 C), temperature source Oral, resp. rate 18, height '5\' 7"'  (1.702 m), weight 146 lb 4.8 oz (66.4 kg), last menstrual period 03/16/2002, SpO2 98 %.  ECOG PERFORMANCE STATUS: 2 - Symptomatic, <50% confined to bed  Physical Exam  Constitutional: She is oriented to person, place, and time.  Middle-aged African-American female who does not appear to be in any acute distress at this time.  HENT:  Head: Normocephalic and atraumatic.  Mouth/Throat: Oropharynx is clear and moist. No oropharyngeal exudate.  No mucosal petechiae or active gingival bleeding noted. No oral ulcers or mucosal lesions  Eyes: Pupils are equal, round, and reactive to light. EOM are normal. No scleral icterus.  Arcus senilis noted  Neck: Normal range  of motion. No  thyromegaly present.  Cardiovascular: Normal rate and regular rhythm.   No murmur heard. Pulmonary/Chest: Effort normal and breath sounds normal. She has no wheezes.  Abdominal: Soft. She exhibits no distension. There is no tenderness.  No hepatosplenomegaly  Musculoskeletal: She exhibits tenderness and deformity. She exhibits no edema.  Left knee joint is significantly edematous and dusky contain a large effusion. No significant tenderness to palpation over the tibial plateau or distal femur. Some tenderness to palpation over the lateral and medial aspects of the joint. The needle does appear to be hyperthermic when compared to contralateral joint.  Lymphadenopathy:    She has no cervical adenopathy.  Neurological: She is alert and oriented to person, place, and time.  No gross focal neurological deficit. Patient's gait is disturbed by the discomfort in the left knee.  Skin: Skin is warm and dry. No rash noted. She is not diaphoretic.  No petechiae. Some occasional small amount of ecchymosis without cutaneous hematomas.     LABORATORY DATA: I have personally reviewed the data as listed: Clinical Support on 12/29/2016  Component Date Value Ref Range Status  . WBC 12/29/2016 7.6  3.9 - 10.3 10e3/uL Final  . NEUT# 12/29/2016 5.3  1.5 - 6.5 10e3/uL Final  . HGB 12/29/2016 11.7  11.6 - 15.9 g/dL Final  . HCT 12/29/2016 35.7  34.8 - 46.6 % Final  . Platelets 12/29/2016 280  145 - 400 10e3/uL Final  . MCV 12/29/2016 87.1  79.5 - 101.0 fL Final  . MCH 12/29/2016 28.5  25.1 - 34.0 pg Final  . MCHC 12/29/2016 32.8  31.5 - 36.0 g/dL Final  . RBC 12/29/2016 4.10  3.70 - 5.45 10e6/uL Final  . RDW 12/29/2016 17.4* 11.2 - 14.5 % Final  . lymph# 12/29/2016 1.6  0.9 - 3.3 10e3/uL Final  . MONO# 12/29/2016 0.6  0.1 - 0.9 10e3/uL Final  . Eosinophils Absolute 12/29/2016 0.1  0.0 - 0.5 10e3/uL Final  . Basophils Absolute 12/29/2016 0.0  0.0 - 0.1 10e3/uL Final  . NEUT% 12/29/2016 69.0  38.4 - 76.8 %  Final  . LYMPH% 12/29/2016 21.4  14.0 - 49.7 % Final  . MONO% 12/29/2016 8.0  0.0 - 14.0 % Final  . EOS% 12/29/2016 1.3  0.0 - 7.0 % Final  . BASO% 12/29/2016 0.3  0.0 - 2.0 % Final  . Sodium 12/29/2016 137  136 - 145 mEq/L Final  . Potassium 12/29/2016 3.6  3.5 - 5.1 mEq/L Final  . Chloride 12/29/2016 101  98 - 109 mEq/L Final  . CO2 12/29/2016 23  22 - 29 mEq/L Final  . Glucose 12/29/2016 163* 70 - 140 mg/dl Final   Glucose reference range is for nonfasting patients. Fasting glucose reference range is 70- 100.  [name] BUN 12/29/2016 97.6* 7.0 - 26.0 mg/dL Final  . Creatinine 12/29/2016 3.7* 0.6 - 1.1 mg/dL Final  . Total Bilirubin 12/29/2016 0.33  0.20 - 1.20 mg/dL Final  . Alkaline Phosphatase 12/29/2016 148  40 - 150 U/L Final  . AST 12/29/2016 16  5 - 34 U/L Final  . ALT 12/29/2016 22  0 - 55 U/L Final  . Total Protein 12/29/2016 9.1* 6.4 - 8.3 g/dL Final  . Albumin 12/29/2016 3.9  3.5 - 5.0 g/dL Final  . Calcium 12/29/2016 9.8  8.4 - 10.4 mg/dL Final  . Anion Gap 12/29/2016 14* 3 - 11 mEq/L Final  . EGFR 12/29/2016 15* >90 ml/min/1.73 m2 Final   eGFR is calculated using  the CKD-EPI Creatinine Equation (2009)         Ardath Sax, MD

## 2016-12-29 NOTE — Assessment & Plan Note (Addendum)
61 year old female with multiple comorbidities including history of hepatitis C infection, treated with most recent undetectable level of the virus, history of sepsis with multi-joint septic arthritis including bilateral knee infections. Subsequent progression of osteoarthritis in the left knee requiring total knee replacement in May 2017. Postoperatively, recurrent hemorrhagic joint effusions preventing normal function of the joint requiring repeat aspirations. Referred for consultation due to possible presence of coagulopathy contributing to the persistent joint effusions.  On review of the lab work, no recent labs to assess for coagulopathy are available. Previous values are not consistent with coagulopathy. Patient's history offers no other signs of dysfunctional clotting. Patient has no history of disorder and no family history of hemophilia, von Willebrand's disease, or other hematological conditions.  Differential diagnosis includes acquired coagulopathy versus local problems such as of normal vascular pattern or AVM formation resulting in localized bleeding. Last MRI imaging is from September 2016 without any abnormality to explain the current presentation.  Plan: --Labs today: CBC/diff, CMP, INR, aPTT -- initial laboratory demonstrating a significant elevation of total protein with normal level of albumin. This is concerning for possible presence of monoclonal gammopathy and will be further evaluated with SPEP, SIFE, light chain assay. We will repeat HCV Ab with reflex titers. --RTC 1 week to review.

## 2016-12-29 NOTE — Telephone Encounter (Signed)
Scheduled lab appt per 7/16 sch message from [facility] - patient is aware of appt date and time.

## 2016-12-29 NOTE — Patient Instructions (Signed)
Thank you for choosing [facility] to provide your oncology and hematology care.  To afford each patient quality time with our providers, please arrive 30 minutes before your scheduled appointment time.  If you arrive late for your appointment, you may be asked to reschedule.  We strive to give you quality time with our providers, and arriving late affects you and other patients whose appointments are after yours.   If you are a no show for multiple scheduled visits, you may be dismissed from the clinic at the providers discretion.    Again, thank you for choosing [facility], our hope is that these requests will decrease the amount of time that you wait before being seen by our physicians.  ______________________________________________________________________  Should you have questions after your visit to the [facility], please contact our office at [phone] between the hours of 8:30 and 4:30 p.m.    Voicemails left after 4:30p.m will not be returned until the following business day.    For prescription refill requests, please have your pharmacy contact us directly.  Please also try to allow 48 hours for prescription requests.    Please contact the scheduling department for questions regarding scheduling.  For scheduling of procedures such as PET scans, CT scans, MRI, Ultrasound, etc please contact central scheduling at [phone].    Resources For Cancer Patients and Caregivers:   Oncolink.org:  A wonderful resource for patients and healthcare providers for information regarding your disease, ways to tract your treatment, what to expect, etc.     American Cancer Society:  800-227-2345  Can help patients locate various types of support and financial assistance  Cancer Care: 1-800-813-HOPE (4673) Provides financial assistance, online support groups, medication/co-pay assistance.    [place] DSS:  336-641-3447 Where to apply for food  stamps, Medicaid, and utility assistance  Medicare Rights Center: 800-333-4114 Helps people with Medicare understand their rights and benefits, navigate the Medicare system, and secure the quality healthcare they deserve  SCAT: 336-333-6589 [place] [employer]'s shared-ride transportation service for eligible riders who have a disability that prevents them from riding the fixed route bus.    For additional information on assistance programs please contact our social worker:   Grier Hock/Abigail Elmore:  [phone]            

## 2016-12-29 NOTE — Telephone Encounter (Signed)
Scheduled appt per 7/16 los - Gave patient AVS and calender per los.  

## 2016-12-30 LAB — PROTHROMBIN TIME (PT)
INR: 1 (ref 0.8–1.2)
Prothrombin Time: 11 s (ref 9.1–12.0)

## 2016-12-30 LAB — APTT: aPTT: 30 s (ref 24–33)

## 2016-12-30 LAB — FIBRINOGEN: FIBRINOGEN: 350 mg/dL (ref 193–507)

## 2016-12-31 LAB — VON WILLEBRAND PANEL
Factor VIII Activity: 251 % — ABNORMAL HIGH (ref 57–163)
vWF Activity: 101 % (ref 50–200)
von Willebrand Factor (vWF) Ag: 328 % — ABNORMAL HIGH (ref 50–200)

## 2017-01-01 ENCOUNTER — Other Ambulatory Visit (HOSPITAL_BASED_OUTPATIENT_CLINIC_OR_DEPARTMENT_OTHER): Payer: Medicaid Other

## 2017-01-01 ENCOUNTER — Other Ambulatory Visit: Payer: Self-pay | Admitting: *Deleted

## 2017-01-01 DIAGNOSIS — M25062 Hemarthrosis, left knee: Secondary | ICD-10-CM

## 2017-01-01 DIAGNOSIS — M2500 Hemarthrosis, unspecified joint: Secondary | ICD-10-CM

## 2017-01-01 DIAGNOSIS — R778 Other specified abnormalities of plasma proteins: Secondary | ICD-10-CM

## 2017-01-02 LAB — KAPPA/LAMBDA LIGHT CHAINS
Ig Kappa Free Light Chain: 151.9 mg/L — ABNORMAL HIGH (ref 3.3–19.4)
Ig Lambda Free Light Chain: 88.8 mg/L — ABNORMAL HIGH (ref 5.7–26.3)
Kappa/Lambda FluidC Ratio: 1.71 — ABNORMAL HIGH (ref 0.26–1.65)

## 2017-01-02 LAB — HEPATITIS C ANTIBODY (REFLEX): HCV Ab: >11 s/co ratio — ABNORMAL HIGH (ref 0.0–0.9)

## 2017-01-05 ENCOUNTER — Other Ambulatory Visit: Payer: Self-pay | Admitting: *Deleted

## 2017-01-05 ENCOUNTER — Ambulatory Visit (HOSPITAL_BASED_OUTPATIENT_CLINIC_OR_DEPARTMENT_OTHER): Payer: Medicaid Other | Admitting: Hematology and Oncology

## 2017-01-05 ENCOUNTER — Encounter: Payer: Self-pay | Admitting: Hematology and Oncology

## 2017-01-05 VITALS — BP 158/79 | HR 80 | Temp 97.7°F | Resp 18 | Ht 67.0 in | Wt 149.4 lb

## 2017-01-05 DIAGNOSIS — M25062 Hemarthrosis, left knee: Secondary | ICD-10-CM

## 2017-01-05 DIAGNOSIS — R778 Other specified abnormalities of plasma proteins: Secondary | ICD-10-CM

## 2017-01-05 NOTE — Assessment & Plan Note (Addendum)
61 year old female with multiple comorbidities including chronic kidney disease, history of hepatitis C infection, treated with most recent undetectable level of the virus, history of sepsis with multi-joint septic arthritis including bilateral knee infections. Subsequent progression of osteoarthritis in the left knee requiring total knee replacement in May 2017. Postoperatively, recurrent hemorrhagic joint effusions preventing normal function of the joint requiring repeat aspirations. Referred for consultation due to possible presence of coagulopathy contributing to the persistent joint effusions.  Our evaluation focused on presence of possible coagulopathy. Patient's INR and APTT were both normal, there was no evidence of factor VIII or von Willebrand's factor deficiency or dysfunction, although the multimer assay for von Willebrand's factor still pending. PFA-100 demonstrated abnormalities in both ADP and epinephrine binding. Additional labs demonstrated presence of elevated creatinine with BUNs of 98. This findings are not consistent with von Willebrand's disease, but more likely reflective of platelet dysfunction, possibly due to elevated BUNs in the setting of renal failure. Additional possible etiologies include use of nonsteroidal anti-inflammatory drugs which patient denies.  In this setting, addressing her renal insufficiency is likely the best approach. Patient needs to be seen by nephrology in short order and potentially resume dialysis therapy if so indicated. We can consider obtaining an angiogram to assess for possible normal vascular patterns around the knee on the left contributing to the bleeding. Additionally, we can consider estrogen therapy to improve platelet function, context of renal insufficiency if additional arthrocentesis is considered.  Plan: --Will obtain additional labs including platelet aggregation studies as well as an additional look into dysproteinemia --I will be happy  to provide more specific recommendations regarding use of estrogens if patient is scheduled for additional procedures on her left knee.

## 2017-01-05 NOTE — Progress Notes (Signed)
[name]:  Assessment: Hemarthrosis, left knee 61 year old female with multiple comorbidities including chronic kidney disease, history of hepatitis C infection, treated with most recent undetectable level of the virus, history of sepsis with multi-joint septic arthritis including bilateral knee infections. Subsequent progression of osteoarthritis in the left knee requiring total knee replacement in May 2017. Postoperatively, recurrent hemorrhagic joint effusions preventing normal function of the joint requiring repeat aspirations. Referred for consultation due to possible presence of coagulopathy contributing to the persistent joint effusions.  Our evaluation focused on presence of possible coagulopathy. Patient's INR and APTT were both normal, there was no evidence of factor VIII or von Willebrand's factor deficiency or dysfunction, although the multimer assay for von Willebrand's factor still pending. PFA-100 demonstrated abnormalities in both ADP and epinephrine binding. Additional labs demonstrated presence of elevated creatinine with BUNs of 98. This findings are not consistent with von Willebrand's disease, but more likely reflective of platelet dysfunction, possibly due to elevated BUNs in the setting of renal failure. Additional possible etiologies include use of nonsteroidal anti-inflammatory drugs which patient denies.  In this setting, addressing her renal insufficiency is likely the best approach. Patient needs to be seen by nephrology in short order and potentially resume dialysis therapy if so indicated. We can consider obtaining an angiogram to assess for possible normal vascular patterns around the knee on the left contributing to the bleeding. Additionally, we can consider estrogen therapy to improve platelet function, context of renal insufficiency if additional arthrocentesis is considered.  Plan: --Will obtain additional labs including platelet  aggregation studies as well as an additional look into dysproteinemia --I will be happy to provide more specific recommendations regarding use of estrogens if patient is scheduled for additional procedures on her left knee.   Elevated total protein Reports a level of 9.1 with albumin level of 3.9 detected at the last visit in the setting of normal calcium and severely elevated creatinine. Patient has minimal anemia and no thrombocytopenia. Serum free light chains demonstrate elevation of both kappa and lambda with slightly elevated ratio. Patient also tested positive for hepatitis C antibodies, but this is likely related to her previous infection.  Plan: --Hepatitis C RNA --Cryofibrinogenemia and cryoglobulin --Awaiting SPEP, SIEP, and immunoglobulin levels. Voice recognition software was used and creation of this note. Despite my best effort at editing the text, some misspelling/errors may have occurred.  Orders Placed This Encounter  Procedures  . HCV RNA quant rflx ultra or genotyp    Standing Status:   Future    Standing Expiration Date:   01/05/2018  . Von Willebrand Multimeric    Standing Status:   Future    Number of Occurrences:   1    Standing Expiration Date:   01/05/2018  . Platelet aggregation study (Sent to [facility])    Standing Status:   Future    Standing Expiration Date:   01/05/2018  . Multiple Myeloma Panel (SPEP&IFE w/QIG)    Standing Status:   Future    Number of Occurrences:   1    Standing Expiration Date:   01/05/2018  . Kappa/lambda light chains    Standing Status:   Future    Number of Occurrences:   1    Standing Expiration Date:   01/05/2018  . Cryoglobulin    Standing Status:   Future    Standing Expiration Date:   01/05/2018  . Cryofibrinogen, Qualitative    Standing Status:   Future    Standing  Expiration Date:   01/05/2018  . 24-Hr Ur UPEP/UIFE/Light Chains/TP    Standing Status:   Future    Standing Expiration Date:   01/05/2018    Cancer Staging No  matching staging information was found for the patient.  All questions were answered. . The patient knows to call the clinic with any problems, questions or concerns.  This note was electronically signed.    History of Presenting Illness Brandy Houston 61 y.o. presenting to the [facility] for possible coagulopathy in the setting of recurrent hemarthrosis. Patient was referred by Dr Leandrew Koyanagi who follows the patient for recurrent left knee effusions and patellar instability precipitated by those effusions. Patient reports that the problem with the left knee started approximately 2 years ago she suffered a severe systemic infection with MSSA which included development of the bilateral septic knee joint, bilateral AC joint infections as well as right hand flexor tendon sheaths infection. The shunt underwent extensive surgical debridement in Feb 2016. Including at least 2 surgeries on the left knee. 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  In May 2017, patient underwent a left knee arthroplasty due to severe osteoarthritis. Since that time, patient has suffered from recurrent effusions left knee joint and requires at least 3 thoracentesis procedures with hemorrhagic fluid present every time. Effusion keeps reaccumulating and appears to be reducing patient's mobility and ability to participate in the physical therapy. Most recent note from nephrology date 09/15/16 demonstrates anasarca, likely due to severe proteinuria and subsequent fluid retention. Your important clearance is over 8 g at the time. Patient also has a history of hepatitis C infection detected in February 2016 treated with antiviral therapy. Initial viral titers exceeded 8 million copies. Most recent evaluation in December 2016 demonstrated undetectable levels of viral RNA.  On additional questioning, patient also indicates easy  bruisability the past 2 years coinciding with onset of the above-mentioned infection. She denies history of cutaneous hematomas. She has never had any bleeding in any of the other joints that she is aware of. Currently, patient is postmenopausal, but when she was having her regular periods, she has never had heavy periods or. Severe enough to cause anemia requiring transfusions. Patient denies any known family history of hematological disorders or bleeding propensity. At the present time, patient denies epistaxis, gum bleeding, hemoptysis, hematemesis, melena, hematochezia, vaginal bleeding or hematuria. Denies any fever, chills or night sweats. No swollen glands in the neck, armpits, or groin. No early satiety, dysphagia, nausea, or vomiting. No abdominal pain.   Oncological/hematological History: **Lt knee hemarthrosis, recurrent: --Labs, Mar 2016: SPEP -- no evidence of monoclonal protein; Cryoglobulin -- negative. --MRI Lt Knee, 03/16/15: Large joint effusion and severe synovitis. Moderately-size Baker cyst with debris and synovitis. Extensive erosive changes in the tibia, femur, and patella. Severe diffuse cellulitis and synovitis. --Labs, 11/07/15: INR 1.51, aPTT 38, Fibrinogen 231; --Lt Knee Xray, 11/07/15: Total knee arthroplasty without periprosthetic fracture or subluxation. --Labs, 01/27/16: Cr 2.23, BUN 34; WBC 7.3, Hgb 8.8, Plt 214;  --Labs, 11/26/16: Fe 71, FeSat 21%, TIBC 333, Ferritin 413 --Labs, 12/29/16: tProt 9.1, Alb 3.9, Ca 9.8, Cr 3.7, BUN 98; INR 1.0, aPTT 30, fVIII 251, vWF Ag 328, vWFAct-ty 101%; PFA-100 -- Collagen/ADP 173, Collagen/Epi 285; Kappa 151.9, lambda 88.8, KLR 1.71 (up);   Medical History: Past Medical History:  Diagnosis Date  . Anemia   . Anxiety   . Chronic diastolic CHF (congestive  heart failure) ([place])   . Chronic kidney disease    stage IV. previous HD, none currently 10/30/15  . Depression    Chronic  . Diabetes mellitus    type 2, IDDM  . Diabetic  peripheral neuropathy ([place])   . Dysrhythmia    tachycardia, normal ECHO 08-09-14  . GERD (gastroesophageal reflux disease)   . Hepatitis C 2016  . Hepatitis C 08/16/2014  . Hypertension   . Osteoarthritis of left knee   . Peripheral neuropathy   . Protein calorie malnutrition ([place])   . Septic arthritis ([place])   . Slow transit constipation   . Uncontrolled hypertension 02/18/2013  . Unsteady gait     Surgical History: Past Surgical History:  Procedure Laterality Date  . APPENDECTOMY    . AV FISTULA PLACEMENT Left 09/13/2014   Procedure: Brachial Artery to Brachial Vein Gortex Four - Seven Stretch GRAFT INSERTION Left Forearm;  Surgeon: Mal Misty, MD;  Location: [facility];  Service: Vascular;  Laterality: Left;  . CHOLECYSTECTOMY    . KNEE ARTHROSCOPY Right 08/10/2014   Procedure: ARTHROSCOPY I & D KNEE;  Surgeon: Marianna Payment, MD;  Location: [facility];  Service: Orthopedics;  Laterality: Right;  . KNEE ARTHROSCOPY Left 08/11/2014   Procedure: ARTHROSCOPIC WASHOUT LEFT KNEE;  Surgeon: Marianna Payment, MD;  Location: [facility];  Service: Orthopedics;  Laterality: Left;  . KNEE ARTHROSCOPY Left 08/19/2014   Procedure: ARTHROSCOPIC WASHOUT LEFT KNEE;  Surgeon: Leandrew Koyanagi, MD;  Location: [facility];  Service: Orthopedics;  Laterality: Left;  . KNEE ARTHROSCOPY WITH LATERAL MENISECTOMY Left 04/04/2015   Procedure: AND PARTIAL LATERAL MENISECTOMY;  Surgeon: Leandrew Koyanagi, MD;  Location: [facility];  Service: Orthopedics;  Laterality: Left;  . KNEE ARTHROSCOPY WITH MEDIAL MENISECTOMY Left 04/04/2015   Procedure: LEFT KNEE ARTHROSCOPY WITH PARTIAL MEDIAL MENISCECTOMY  AND SYNOVECTOMY;  Surgeon: Leandrew Koyanagi, MD;  Location: [facility];  Service: Orthopedics;  Laterality: Left;  . SHOULDER ARTHROSCOPY Bilateral 08/10/2014   Procedure: I & D BILATERAL SHOULDERS ;  Surgeon: Marianna Payment, MD;  Location: [facility];  Service: Orthopedics;  Laterality: Bilateral;  . SMALL  INTESTINE SURGERY     Due to Small Bowel Obstruction  . TEE WITHOUT CARDIOVERSION N/A 08/14/2014   Procedure: TRANSESOPHAGEAL ECHOCARDIOGRAM (TEE);  Surgeon: Thayer Headings, MD;  Location: [facility];  Service: Cardiovascular;  Laterality: N/A;  . TENOSYNOVECTOMY Right 08/11/2014   Procedure: RIGHT WRIST IRRIGATION AND DEBRIDEMENT, TENOSYNOVECTOMY;  Surgeon: Marianna Payment, MD;  Location: [facility];  Service: Orthopedics;  Laterality: Right;  . TOTAL KNEE ARTHROPLASTY Left 11/07/2015   Procedure: LEFT TOTAL KNEE ARTHROPLASTY WITH REVISION OF IMPLANTS;  Surgeon: Leandrew Koyanagi, MD;  Location: [facility];  Service: Orthopedics;  Laterality: Left;  . TUBAL LIGATION      Family History: Family History  Problem Relation Age of Onset  . Cancer Mother   . Heart disease Father   . Cancer Sister   . Hypertension Sister   . Cancer Brother   . Diabetes Brother   . Hypertension Brother     Social History: Social History   Social History  . Marital status: Single    Spouse name: N/A  . Number of children: N/A  . Years of education: N/A   Occupational History  . Not on file.   Social History Main Topics  . Smoking status: Current Every Day Smoker    Packs/day: 0.50    Years:  20.00    Types: Cigarettes  . Smokeless tobacco: Never Used     Comment: Smoking 7-8 cigs per day  . Alcohol use No     Comment: per pt no alcohol since 09/2014  . Drug use: No  . Sexual activity: No   Other Topics Concern  . Not on file   Social History Narrative  . No narrative on file    Allergies: Allergies  Allergen Reactions  . Compazine [Prochlorperazine] Shortness Of Breath and Swelling    TONGUE SWELLS  . Shellfish-Derived Products Anaphylaxis  . Iodinated Diagnostic Agents Hives and Rash  . Omnipaque [Iohexol] Hives    Medications:  Current Outpatient Prescriptions  Medication Sig Dispense Refill  . acetaminophen (TYLENOL) 500 MG tablet Take 1,000 mg by mouth every 6 (six) hours as needed  for mild pain.    [name] amLODipine (NORVASC) 10 MG tablet Take 10 mg by mouth daily.    [name] aspirin 81 MG chewable tablet Chew 81 mg by mouth daily.     . Blood Glucose Monitoring Suppl (ACCU-CHEK AVIVA PLUS) W/DEVICE KIT Check sugars TID for E11.65 1 kit 0  . calcium carbonate (TUMS EX) 750 MG chewable tablet Chew 1 tablet by mouth daily.    . Diclofenac Sodium 2 % SOLN Place 2 g onto the skin 2 (two) times daily as needed. 1 Bottle 5  . diphenhydrAMINE (BENADRYL) 25 mg capsule Take 1 capsule (25 mg total) by mouth every 6 (six) hours as needed for itching. 30 capsule 0  . ferrous sulfate 325 (65 FE) MG tablet Take 325 mg by mouth 2 (two) times daily with a meal.    . furosemide (LASIX) 80 MG tablet Take 160 mg by mouth 3 (three) times daily.     [name] gabapentin (NEURONTIN) 100 MG capsule Take 1 capsule (100 mg total) by mouth 2 (two) times daily. 60 capsule 2  . glucose blood (ACCU-CHEK AVIVA) test strip Check sugars TID for E11.65 100 each 12  . hydrALAZINE (APRESOLINE) 100 MG tablet Take 1 tablet by mouth 2 (two) times daily.    . insulin glargine (LANTUS) 100 UNIT/ML injection Inject 5 Units into the skin daily.    [name] Devices (ACCU-CHEK SOFTCLIX) lancets Check sugars TID for E11.65 1 each 10  . methocarbamol (ROBAXIN) 500 MG tablet TAKE 1 TABLET BY MOUTH EVERY 6 HOURS AS NEEDED FOR MUSCLE SPASM 30 tablet 2  . methocarbamol (ROBAXIN) 750 MG tablet Take 1 tablet (750 mg total) by mouth 2 (two) times daily as needed for muscle spasms. 60 tablet 0  . metoprolol succinate (TOPROL-XL) 50 MG 24 hr tablet Take 1 tablet by mouth 2 (two) times daily.     . Multiple Vitamin (MULTIVITAMIN WITH MINERALS) TABS tablet Take 1 tablet by mouth daily.    [name] omeprazole (PRILOSEC) 20 MG capsule Take 1 capsule by mouth every morning.     . ondansetron (ZOFRAN) 4 MG tablet Take 1-2 tablets (4-8 mg total) by mouth every 8 (eight) hours as needed for nausea or vomiting. 40 tablet 0  . Potassium Chloride ER 20 MEQ TBCR  Take 1 tablet by mouth 2 (two) times daily.     . sennosides-docusate sodium (SENOKOT-S) 8.6-50 MG tablet Take 2 tablets by mouth at bedtime.    . sertraline (ZOLOFT) 100 MG tablet Take 1 tablet (100 mg total) by mouth daily. 30 tablet 6   No current facility-administered medications for this visit.     Review of Systems: Review  of Systems  All other systems reviewed and are negative.    PHYSICAL EXAMINATION Blood pressure (!) 158/79, pulse 80, temperature 97.7 F (36.5 C), temperature source Oral, resp. rate 18, height '5\' 7"'  (1.702 m), weight 149 lb 6.4 oz (67.8 kg), last menstrual period 03/16/2002, SpO2 100 %.  ECOG PERFORMANCE STATUS: 2 - Symptomatic, <50% confined to bed  Physical Exam  Constitutional: She is oriented to person, place, and time.  Middle-aged African-American female who does not appear to be in any acute distress at this time.  HENT:  Head: Normocephalic and atraumatic.  Mouth/Throat: Oropharynx is clear and moist. No oropharyngeal exudate.  No mucosal petechiae or active gingival bleeding noted. No oral ulcers or mucosal lesions  Eyes: Pupils are equal, round, and reactive to light. EOM are normal. No scleral icterus.  Arcus senilis noted  Neck: Normal range of motion. No thyromegaly present.  Cardiovascular: Normal rate and regular rhythm.   No murmur heard. Pulmonary/Chest: Effort normal and breath sounds normal. She has no wheezes.  Abdominal: Soft. She exhibits no distension. There is no tenderness.  No hepatosplenomegaly  Musculoskeletal: She exhibits tenderness and deformity. She exhibits no edema.  Left knee joint is significantly edematous and dusky contain a large effusion. No significant tenderness to palpation over the tibial plateau or distal femur. Some tenderness to palpation over the lateral and medial aspects of the joint. The needle does appear to be hyperthermic when compared to contralateral joint.  Lymphadenopathy:    She has no cervical  adenopathy.  Neurological: She is alert and oriented to person, place, and time.  No gross focal neurological deficit. Patient's gait is disturbed by the discomfort in the left knee.  Skin: Skin is warm and dry. No rash noted. She is not diaphoretic.  No petechiae. Some occasional small amount of ecchymosis without cutaneous hematomas.     LABORATORY DATA: I have personally reviewed the data as listed: Appointment on 01/01/2017  Component Date Value Ref Range Status  . Ig Kappa Free Light Chain 01/01/2017 151.9* 3.3 - 19.4 mg/L Final  . Ig Lambda Free Light Chain 01/01/2017 88.8* 5.7 - 26.3 mg/L Final  . Kappa/Lambda FluidC Ratio 01/01/2017 1.71* 0.26 - 1.65 Final  . HCV Ab 01/01/2017 >11.0* 0.0 - 0.9 s/co ratio Final  . COMMENT HCV-2 01/01/2017 Comment   Final   Comment: Strong reactive antibody screen (s/c ratio >10.9) is consistent with past or present HCV infection.  Follow-up testing by HCV, Quantitative, Real time PCR (#[id number]) is recommended to determine viral load/diagnosis of current HCV infection.        Ardath Sax, MD

## 2017-01-05 NOTE — Assessment & Plan Note (Signed)
Reports a level of 9.1 with albumin level of 3.9 detected at the last visit in the setting of normal calcium and severely elevated creatinine. Patient has minimal anemia and no thrombocytopenia. Serum free light chains demonstrate elevation of both kappa and lambda with slightly elevated ratio. Patient also tested positive for hepatitis C antibodies, but this is likely related to her previous infection.  Plan: --Hepatitis C RNA --Cryofibrinogenemia and cryoglobulin --Awaiting SPEP, SIEP, and immunoglobulin levels.

## 2017-01-06 ENCOUNTER — Telehealth: Payer: Self-pay | Admitting: Hematology and Oncology

## 2017-01-06 ENCOUNTER — Other Ambulatory Visit: Payer: Medicaid Other

## 2017-01-06 ENCOUNTER — Encounter (HOSPITAL_COMMUNITY): Payer: Medicaid Other

## 2017-01-06 NOTE — Telephone Encounter (Signed)
Added f/u 8/6. Left message for patient and also confirmed 7/26. Patient to get updated schedule 7/26.

## 2017-01-07 LAB — VISCOSITY, SERUM: Viscosity, Serum: 1.9 rel.saline (ref 1.6–1.9)

## 2017-01-07 LAB — VON WILLEBRAND FACTOR MULTIMER

## 2017-01-08 ENCOUNTER — Other Ambulatory Visit: Payer: Medicaid Other

## 2017-01-08 DIAGNOSIS — M25062 Hemarthrosis, left knee: Secondary | ICD-10-CM

## 2017-01-08 DIAGNOSIS — R778 Other specified abnormalities of plasma proteins: Secondary | ICD-10-CM

## 2017-01-08 LAB — MULTIPLE MYELOMA PANEL, SERUM
Albumin SerPl Elph-Mcnc: 3.5 g/dL (ref 2.9–4.4)
Albumin/Glob SerPl: 0.9 (ref 0.7–1.7)
Alpha 1: 0.3 g/dL (ref 0.0–0.4)
Alpha2 Glob SerPl Elph-Mcnc: 0.9 g/dL (ref 0.4–1.0)
B-Globulin SerPl Elph-Mcnc: 1 g/dL (ref 0.7–1.3)
Gamma Glob SerPl Elph-Mcnc: 2 g/dL — ABNORMAL HIGH (ref 0.4–1.8)
Globulin, Total: 4.2 g/dL — ABNORMAL HIGH (ref 2.2–3.9)
IgA, Qn, Serum: 142 mg/dL (ref 87–352)
IgG, Qn, Serum: 1947 mg/dL — ABNORMAL HIGH (ref 700–1600)
IgM, Qn, Serum: 113 mg/dL (ref 26–217)
Total Protein: 7.7 g/dL (ref 6.0–8.5)

## 2017-01-09 LAB — PLATELET AGGREGATION STUDY, BLOOD: Epinephrine 10: NORMAL

## 2017-01-09 LAB — HCV RNA QUANT RFLX ULTRA OR GENOTYP: HCV Quant Baseline: NOT DETECTED [IU]/mL

## 2017-01-12 ENCOUNTER — Ambulatory Visit (INDEPENDENT_AMBULATORY_CARE_PROVIDER_SITE_OTHER): Payer: Medicaid Other | Admitting: Obstetrics and Gynecology

## 2017-01-12 ENCOUNTER — Encounter: Payer: Self-pay | Admitting: Obstetrics and Gynecology

## 2017-01-12 DIAGNOSIS — N871 Moderate cervical dysplasia: Secondary | ICD-10-CM | POA: Insufficient documentation

## 2017-01-12 LAB — CRYOFIBRINOGEN, QUALITATIVE

## 2017-01-12 NOTE — Progress Notes (Signed)
Patient identified, informed consent obtained, signed copy in chart, time out performed.  Pap smear and colposcopy reviewed.   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  Cervix visualized. Paracervical block placed.  A medium size LOOP used to remove cone of cervix using blend of cut and cautery on LEEP machine.  Edges/Base cauterized with Ball.  Monsel's solution used for hemostasis.  Patient tolerated procedure well.  Patient given post procedure instructions.  Follow up in 6-12 months for repeat pap or as needed.

## 2017-01-14 ENCOUNTER — Encounter (HOSPITAL_COMMUNITY): Payer: Medicaid Other

## 2017-01-15 DIAGNOSIS — R778 Other specified abnormalities of plasma proteins: Secondary | ICD-10-CM | POA: Diagnosis not present

## 2017-01-15 DIAGNOSIS — M25062 Hemarthrosis, left knee: Secondary | ICD-10-CM | POA: Diagnosis not present

## 2017-01-16 LAB — UPEP/UIFE/LIGHT CHAINS/TP, 24-HR UR
% BETA, Urine: 11.1 %
ALBUMIN, U: 64.8 %
ALPHA 1 URINE: 2.8 %
ALPHA-2-GLOBULIN, U: 5.7 %
Free Kappa Lt Chains,Ur: 178 mg/L — ABNORMAL HIGH (ref 1.35–24.19)
Free Lambda Lt Chains,Ur: 21.8 mg/L — ABNORMAL HIGH (ref 0.24–6.66)
GAMMA GLOBULIN URINE: 15.6 %
Kappa/Lambda Ratio,U: 8.17 (ref 2.04–10.37)
PROTEIN,TOTAL,URINE: 270.1 mg/dL
Prot,24hr calculated: 4322 mg/24 hr — ABNORMAL HIGH (ref 30–150)

## 2017-01-19 ENCOUNTER — Other Ambulatory Visit (INDEPENDENT_AMBULATORY_CARE_PROVIDER_SITE_OTHER): Payer: Self-pay | Admitting: Orthopaedic Surgery

## 2017-01-19 ENCOUNTER — Ambulatory Visit (HOSPITAL_BASED_OUTPATIENT_CLINIC_OR_DEPARTMENT_OTHER): Payer: Medicare Other | Admitting: Hematology and Oncology

## 2017-01-19 VITALS — BP 158/70 | HR 76 | Temp 98.4°F | Resp 18 | Ht 67.0 in | Wt 143.5 lb

## 2017-01-19 DIAGNOSIS — M25062 Hemarthrosis, left knee: Secondary | ICD-10-CM

## 2017-01-19 DIAGNOSIS — R778 Other specified abnormalities of plasma proteins: Secondary | ICD-10-CM | POA: Diagnosis not present

## 2017-01-19 LAB — CRYOGLOBULIN

## 2017-01-19 NOTE — Assessment & Plan Note (Signed)
61 year old female with multiple comorbidities including chronic kidney disease, history of hepatitis C infection, history of sepsis with multi-joint septic arthritis including bilateral knee infections. Subsequent progression of osteoarthritis in the left knee requiring total knee replacement in May 2017. Postoperatively, recurrent hemorrhagic joint effusions preventing normal function of the joint requiring repeat aspirations.   Our evaluation focused on presence of possible coagulopathy. Patient's INR and APTT were both normal, there was no evidence of factor VIII or von Willebrand's factor deficiency or dysfunction. PFA-100 demonstrated abnormalities in both ADP and epinephrine binding. Subsequent platelet aggregation studies demonstrate significant abnormalities across the spectrum. These are distant with IV a presence of antiplatelet drug in the system, but patient denies use of any NSAIDs or other platelet inhibitors other than a low dose aspirin. Additionally, she has significant renal dysfunction with uremia and BUN exceeding 90 which is likely the cause of the platelet dysfunction. Platelet dysfunction turn is likely responsible for the recurrent bleeding into the left knee joint.  Additionally, she may have a degree of coagulopathy based on significant proteinuria with possible loss of clotting factors with urinary protein. At the same time, anti-clotting factors maybe lost as well and exact risk of bleeding versus clotting is unclear at this time.  In this setting, addressing her renal insufficiency is likely the best approach. Patient currently has appointment with her nephrologist scheduled to assess possible need of returning to hemodialysis therapy. Once uremia is improved, repeat arthrocentesis with evacuation of the hemorrhagic effusion may be considered if BUN drops below 60. Additionally, estrogen therapy can assist in stabilizing platelet function, but I would like to reserve it only for  planned procedures as patient may be at increased risk of clotting due to decreased mobility and proteinuria.  Plan: --No specific intervention at this time from Hem-onc standpoint --Defer management of chronic renal insufficiency from nephrology and management of the left knee hemarthrosis to orthopedic surgery. I will be happy to assist with estrogen therapy prescription if another arthrocentesis is planned in the future. --RTC 23months with labs for hematological monitoring

## 2017-01-19 NOTE — Assessment & Plan Note (Signed)
Reports a level of 9.1 with albumin level of 3.9 detected at the last visit in the setting of normal calcium and severely elevated creatinine. Patient has minimal anemia and no thrombocytopenia. No evidence of monoclonal protein was discovered. Patient tested negative for recurrent hepatitis C infection, no evidence of cryoglobulin or cryofibrinogen presence. Likely, this excess protein is related to polyclonal antibiotic is developed subsequent to severe multi-joint infection. They may indicate persistence of an infection source somewhere in the body.  Plan: --Will continue monitoring at this time

## 2017-01-19 NOTE — Progress Notes (Signed)
[facility] Follow-up Visit:  Assessment: Hemarthrosis, left knee 61 year old female with multiple comorbidities including chronic kidney disease, history of hepatitis C infection, history of sepsis with multi-joint septic arthritis including bilateral knee infections. Subsequent progression of osteoarthritis in the left knee requiring total knee replacement in May 2017. Postoperatively, recurrent hemorrhagic joint effusions preventing normal function of the joint requiring repeat aspirations.   Our evaluation focused on presence of possible coagulopathy. Patient's INR and APTT were both normal, there was no evidence of factor VIII or von Willebrand's factor deficiency or dysfunction. PFA-100 demonstrated abnormalities in both ADP and epinephrine binding. Subsequent platelet aggregation studies demonstrate significant abnormalities across the spectrum. These are distant with IV a presence of antiplatelet drug in the system, but patient denies use of any NSAIDs or other platelet inhibitors other than a low dose aspirin. Additionally, she has significant renal dysfunction with uremia and BUN exceeding 90 which is likely the cause of the platelet dysfunction. Platelet dysfunction turn is likely responsible for the recurrent bleeding into the left knee joint.  Additionally, she may have a degree of coagulopathy based on significant proteinuria with possible loss of clotting factors with urinary protein. At the same time, anti-clotting factors maybe lost as well and exact risk of bleeding versus clotting is unclear at this time.  In this setting, addressing her renal insufficiency is likely the best approach. Patient currently has appointment with her nephrologist scheduled to assess possible need of returning to hemodialysis therapy. Once uremia is improved, repeat arthrocentesis with evacuation of the hemorrhagic effusion may be considered if BUN drops below 60. Additionally, estrogen  therapy can assist in stabilizing platelet function, but I would like to reserve it only for planned procedures as patient may be at increased risk of clotting due to decreased mobility and proteinuria.  Plan: --No specific intervention at this time from Hem-onc standpoint --Defer management of chronic renal insufficiency from nephrology and management of the left knee hemarthrosis to orthopedic surgery. I will be happy to assist with estrogen therapy prescription if another arthrocentesis is planned in the future. --RTC 624month with labs for hematological monitoring   Elevated total protein Reports a level of 9.1 with albumin level of 3.9 detected at the last visit in the setting of normal calcium and severely elevated creatinine. Patient has minimal anemia and no thrombocytopenia. No evidence of monoclonal protein was discovered. Patient tested negative for recurrent hepatitis C infection, no evidence of cryoglobulin or cryofibrinogen presence. Likely, this excess protein is related to polyclonal antibiotic is developed subsequent to severe multi-joint infection. They may indicate persistence of an infection source somewhere in the body.  Plan: --Will continue monitoring at this time Voice recognition software was used and creation of this note. Despite my best effort at editing the text, some misspelling/errors may have occurred.  No orders of the defined types were placed in this encounter.   Cancer Staging No matching staging information was found for the patient.  All questions were answered. . The patient knows to call the clinic with any problems, questions or concerns.  This note was electronically signed.    History of Presenting Illness Brandy [id number]. Patient was referred by Dr NLeandrew Koyanagiwho follows the patient for recurrent left knee effusions and patellar instability precipitated by those effusions.  Patient reported previously that the problem with the left knee started approximately 2 years ago after she has suffered  a severe systemic infection with MSSA which included development of the bilateral septic knee joint, bilateral AC joint infections as well as right hand flexor tendon sheaths infection. The patient underwent extensive surgical debridement in Feb 2016 including at least 2 surgeries on the left knee. Patient also developed diffuse proliferative glomerulonephritis and progressive renal dysfunction and had a period off dialysis dependence until Sep 2016.  In May 2017, patient underwent a left knee arthroplasty due to severe osteoarthritis. Since that time, patient has suffered from recurrent effusions left knee joint and required at least 3 arthrocentesis procedures with hemorrhagic fluid present every time. Effusion keeps reaccumulating and appears to be reducing patient's mobility and ability to participate in the physical therapy. Most recent note from nephrology date 09/15/16 demonstrates anasarca, likely due to severe proteinuria and subsequent fluid retention. Urine priotein clearance was over 8 g at the time. Patient also has a history of hepatitis C infection detected in February 2016 treated with antiviral therapy. Initial viral titers exceeded 8 million copies.   On additional questioning, patient also indicates easy bruisability the past 2 years coinciding with onset of the above-mentioned infection. She denies history of cutaneous hematomas. She has never had any bleeding in any of the other joints that she is aware of. Currently, patient is postmenopausal, but when she was having her regular periods, she has never had heavy periods. Severe enough to cause anemia requiring transfusions. Patient denies any known family history of hematological disorders or bleeding propensity. At the present time, patient denies epistaxis, gum bleeding, hemoptysis, hematemesis, melena, hematochezia,  vaginal bleeding or hematuria. Denies any fever, chills or night sweats. No swollen glands in the neck, armpits, or groin. No early satiety, dysphagia, nausea, or vomiting. No abdominal pain.   Our evaluation revealed no evidence of coagulopathy. Testing was negative for evidence of von Willebrand's disease, factor VIII deficiency, or any evidence of factor inhibitor presence. There was significant dysfunction identified on PFA-100 confirmed by additional platelet function analysis.  Oncological/hematological History: **Lt knee hemarthrosis, recurrent: --Labs, Mar 2016: SPEP -- no evidence of monoclonal protein; Cryoglobulin -- negative. --MRI Lt Knee, 03/16/15: Large joint effusion and severe synovitis. Moderately-size Baker cyst with debris and synovitis. Extensive erosive changes in the tibia, femur, and patella. Severe diffuse cellulitis and synovitis. --Labs, 11/07/15: INR 1.51, aPTT 38, Fibrinogen 231; --Lt Knee Xray, 11/07/15: Total knee arthroplasty without periprosthetic fracture or subluxation. --Labs, 01/27/16: Cr 2.23, BUN 34; WBC 7.3, Hgb 8.8, Plt 214;  --Labs, 11/26/16: Fe 71, FeSat 21%, TIBC 333, Ferritin 413 --Labs, 12/29/16: tProt 9.1, Alb 3.9, Ca 9.8, Cr 3.7, BUN 98; INR 1.0, aPTT 30, fVIII 251, vWF Ag 328, vWFAct-ty 101%; PFA-100 -- Collagen/ADP 173, Collagen/Epi 285; Kappa 151.9, lambda 88.8, KLR 1.71 (up);   Medical History: Past Medical History:  Diagnosis Date  . Anemia   . Anxiety   . Chronic diastolic CHF (congestive heart failure) ([place])   . Chronic kidney disease    stage IV. previous HD, none currently 10/30/15  . Depression    Chronic  . Diabetes mellitus    type 2, IDDM  . Diabetic peripheral neuropathy ([place])   . Dysrhythmia    tachycardia, normal ECHO 08-09-14  . GERD (gastroesophageal reflux disease)   . Hepatitis C 2016  . Hepatitis C 08/16/2014  . Hypertension   . Osteoarthritis of left knee   . Peripheral neuropathy   . Protein calorie malnutrition  ([place])   . Septic arthritis ([place])   . Slow transit constipation   .  Uncontrolled hypertension 02/18/2013  . Unsteady gait     Surgical History: Past Surgical History:  Procedure Laterality Date  . APPENDECTOMY    . AV FISTULA PLACEMENT Left 09/13/2014   Procedure: Brachial Artery to Brachial Vein Gortex Four - Seven Stretch GRAFT INSERTION Left Forearm;  Surgeon: Mal Misty, MD;  Location: [facility];  Service: Vascular;  Laterality: Left;  . CHOLECYSTECTOMY    . KNEE ARTHROSCOPY Right 08/10/2014   Procedure: ARTHROSCOPY I & D KNEE;  Surgeon: Marianna Payment, MD;  Location: [facility];  Service: Orthopedics;  Laterality: Right;  . KNEE ARTHROSCOPY Left 08/11/2014   Procedure: ARTHROSCOPIC WASHOUT LEFT KNEE;  Surgeon: Marianna Payment, MD;  Location: [facility];  Service: Orthopedics;  Laterality: Left;  . KNEE ARTHROSCOPY Left 08/19/2014   Procedure: ARTHROSCOPIC WASHOUT LEFT KNEE;  Surgeon: Leandrew Koyanagi, MD;  Location: [facility];  Service: Orthopedics;  Laterality: Left;  . KNEE ARTHROSCOPY WITH LATERAL MENISECTOMY Left 04/04/2015   Procedure: AND PARTIAL LATERAL MENISECTOMY;  Surgeon: Leandrew Koyanagi, MD;  Location: [facility];  Service: Orthopedics;  Laterality: Left;  . KNEE ARTHROSCOPY WITH MEDIAL MENISECTOMY Left 04/04/2015   Procedure: LEFT KNEE ARTHROSCOPY WITH PARTIAL MEDIAL MENISCECTOMY  AND SYNOVECTOMY;  Surgeon: Leandrew Koyanagi, MD;  Location: [facility];  Service: Orthopedics;  Laterality: Left;  . SHOULDER ARTHROSCOPY Bilateral 08/10/2014   Procedure: I & D BILATERAL SHOULDERS ;  Surgeon: Marianna Payment, MD;  Location: [facility];  Service: Orthopedics;  Laterality: Bilateral;  . SMALL INTESTINE SURGERY     Due to Small Bowel Obstruction  . TEE WITHOUT CARDIOVERSION N/A 08/14/2014   Procedure: TRANSESOPHAGEAL ECHOCARDIOGRAM (TEE);  Surgeon: Thayer Headings, MD;  Location: [facility];  Service: Cardiovascular;  Laterality: N/A;  . TENOSYNOVECTOMY Right 08/11/2014    Procedure: RIGHT WRIST IRRIGATION AND DEBRIDEMENT, TENOSYNOVECTOMY;  Surgeon: Marianna Payment, MD;  Location: [place] [facility];  Service: Orthopedics;  Laterality: Right;  . TOTAL KNEE ARTHROPLASTY Left 11/07/2015   Procedure: LEFT TOTAL KNEE ARTHROPLASTY WITH REVISION OF IMPLANTS;  Surgeon: Leandrew Koyanagi, MD;  Location: [facility];  Service: Orthopedics;  Laterality: Left;  . TUBAL LIGATION      Family History: Family History  Problem Relation Age of Onset  . Cancer Mother   . Heart disease Father   . Cancer Sister   . Hypertension Sister   . Cancer Brother   . Diabetes Brother   . Hypertension Brother     Social History: Social History   Social History  . Marital status: Single    Spouse name: N/A  . Number of children: N/A  . Years of education: N/A   Occupational History  . Not on file.   Social History Main Topics  . Smoking status: Current Every Day Smoker    Packs/day: 0.50    Years: 20.00    Types: Cigarettes  . Smokeless tobacco: Never Used     Comment: Smoking 7-8 cigs per day  . Alcohol use No     Comment: per pt no alcohol since 09/2014  . Drug use: No  . Sexual activity: No   Other Topics Concern  . Not on file   Social History Narrative  . No narrative on file    Allergies: Allergies  Allergen Reactions  . Compazine [Prochlorperazine] Shortness Of Breath and Swelling    TONGUE SWELLS  . Shellfish-Derived Products Anaphylaxis  . Iodinated Diagnostic Agents Hives and Rash  . Omnipaque [Iohexol]  Hives    Medications:  Current Outpatient Prescriptions  Medication Sig Dispense Refill  . acetaminophen (TYLENOL) 500 MG tablet Take 1,000 mg by mouth every 6 (six) hours as needed for mild pain.    [name] amLODipine (NORVASC) 10 MG tablet Take 10 mg by mouth daily.    [name] aspirin 81 MG chewable tablet Chew 81 mg by mouth daily.     . Blood Glucose Monitoring Suppl (ACCU-CHEK AVIVA PLUS) W/DEVICE KIT Check sugars TID for E11.65 1 kit 0  . calcium carbonate (TUMS EX) 750  MG chewable tablet Chew 1 tablet by mouth daily.    . Diclofenac Sodium 2 % SOLN Place 2 g onto the skin 2 (two) times daily as needed. 1 Bottle 5  . diphenhydrAMINE (BENADRYL) 25 mg capsule Take 1 capsule (25 mg total) by mouth every 6 (six) hours as needed for itching. 30 capsule 0  . ferrous sulfate 325 (65 FE) MG tablet Take 325 mg by mouth 2 (two) times daily with a meal.    . furosemide (LASIX) 80 MG tablet Take 160 mg by mouth 3 (three) times daily.     [name] gabapentin (NEURONTIN) 100 MG capsule Take 1 capsule (100 mg total) by mouth 2 (two) times daily. 60 capsule 2  . glucose blood (ACCU-CHEK AVIVA) test strip Check sugars TID for E11.65 100 each 12  . hydrALAZINE (APRESOLINE) 100 MG tablet Take 1 tablet by mouth 2 (two) times daily.    . insulin glargine (LANTUS) 100 UNIT/ML injection Inject 5 Units into the skin daily.    [name] Devices (ACCU-CHEK SOFTCLIX) lancets Check sugars TID for E11.65 1 each 10  . methocarbamol (ROBAXIN) 500 MG tablet TAKE 1 TABLET BY MOUTH EVERY 6 HOURS AS NEEDED FOR MUSCLE SPASM 30 tablet 2  . methocarbamol (ROBAXIN) 750 MG tablet Take 1 tablet (750 mg total) by mouth 2 (two) times daily as needed for muscle spasms. 60 tablet 0  . metoprolol succinate (TOPROL-XL) 50 MG 24 hr tablet Take 1 tablet by mouth 2 (two) times daily.     . Multiple Vitamin (MULTIVITAMIN WITH MINERALS) TABS tablet Take 1 tablet by mouth daily.    [name] omeprazole (PRILOSEC) 20 MG capsule Take 1 capsule by mouth every morning.     . ondansetron (ZOFRAN) 4 MG tablet Take 1-2 tablets (4-8 mg total) by mouth every 8 (eight) hours as needed for nausea or vomiting. 40 tablet 0  . Potassium Chloride ER 20 MEQ TBCR Take 1 tablet by mouth 2 (two) times daily.     . sennosides-docusate sodium (SENOKOT-S) 8.6-50 MG tablet Take 2 tablets by mouth at bedtime.    . sertraline (ZOLOFT) 100 MG tablet Take 1 tablet (100 mg total) by mouth daily. 30 tablet 6   No current facility-administered medications for  this visit.     Review of Systems: Review of Systems  All other systems reviewed and are negative.    PHYSICAL EXAMINATION Blood pressure (!) 158/70, pulse 76, temperature 98.4 F (36.9 C), temperature source Oral, resp. rate 18, height _0  (1.702 m), weight 143 lb 8 oz (65.1 kg), last menstrual period 03/16/2002, SpO2 100 %.  ECOG PERFORMANCE STATUS: 2 - Symptomatic, <50% confined to bed  Physical Exam  Constitutional: She is oriented to person, place, and time.  Middle-aged African-American female who does not appear to be in any acute distress at this time.  HENT:  Head: Normocephalic and atraumatic.  Mouth/Throat: Oropharynx is clear and moist. No oropharyngeal  exudate.  No mucosal petechiae or active gingival bleeding noted. No oral ulcers or mucosal lesions  Eyes: Pupils are equal, round, and reactive to light. EOM are normal. No scleral icterus.  Arcus senilis noted  Neck: Normal range of motion. No thyromegaly present.  Cardiovascular: Normal rate and regular rhythm.   No murmur heard. Pulmonary/Chest: Effort normal and breath sounds normal. She has no wheezes.  Abdominal: Soft. She exhibits no distension. There is no tenderness.  No hepatosplenomegaly  Musculoskeletal: She exhibits tenderness and deformity. She exhibits no edema.  Left knee joint is significantly edematous and has a stable a large effusion. No significant tenderness to palpation over the tibial plateau or distal femur. Some tenderness to palpation over the lateral and medial aspects of the joint.   Lymphadenopathy:    She has no cervical adenopathy.  Neurological: She is alert and oriented to person, place, and time.  No gross focal neurological deficit. Patient's gait is disturbed by the discomfort in the left knee.  Skin: Skin is warm and dry. No rash noted. She is not diaphoretic.  No petechiae. Some occasional small amount of ecchymosis without cutaneous hematomas.     LABORATORY DATA: I have  personally reviewed the data as listed: No visits with results within 1 Week(s) from this visit.  Latest known visit with results is:  Appointment on 01/08/2017  Component Date Value Ref Range Status  . HCV Quant Baseline 01/08/2017 HCV Not Detected  IU/mL Final  . HCV log10 01/08/2017 Test Not Performed.  log10 IU/mL Final   Comment: Unable to calculate result since non-numeric result obtained for component test.   . Test Information 01/08/2017 Comment   Final   The quantitative range of this assay is 15 IU/mL to 100 million IU/mL.  [name] HCV Genotype 01/08/2017 Test Not Performed.   Final   Not indicated  . Spontaneous Plt Aggr 01/08/2017 NOT DEMONSTRATED IN 20 MINUTES   Final  . Epinephrine 10 01/08/2017 NORMAL   Final  . ADP5 01/08/2017 DISAGGREGATION   Final  . ADP10 01/08/2017 DISAGGREGATION   Final  . Collagen Aggregation 01/08/2017 DISAGGREGATION   Final  . Arachidonic Acid 01/08/2017 DISAGGREGATION   Final  . Thrombin Receptor 01/08/2017 WITH DISAGGREGATION   Final  . COMMENT (PLATELET AGGREGATE) 01/08/2017 SEE COMMENT   Final   MULTIPLE ABNORMALITIES OF UNCERTAIN SIGNIFICANCE  . Cryoglobulin, Ql, Serum, Rflx 01/08/2017 Comment  None detected Final   Comment: None Detected at 72 hours This test was developed and its performance characteristics determined by [facility]. It has not been cleared or approved by the Food and Drug Administration.   Mason Jim, Qualitative 01/08/2017 Comment   Final   Comment: None Detected at 72 hours                                          Normal:       None Detected This test was developed and its performance characteristics determined by [facility]. It has not been cleared or approved by the Food and Drug Administration.   [name] PROTEIN,TOTAL,URINE 01/15/2017 270.1  Not Estab. mg/dL Final   Comment: Results confirmed on dilution. Total Volume: 1600 mL   . Prot,24hr calculated 01/15/2017 4,322* 30 - 150 mg/24 hr Final  . ALBUMIN, U 01/15/2017  64.8  % Final  . ALPHA 1 URINE 01/15/2017 2.8  % Final  . ALPHA-2-GLOBULIN, U 01/15/2017  5.7  % Final  . % BETA, Urine 01/15/2017 11.1  % Final  . GAMMA GLOBULIN URINE 01/15/2017 15.6  % Final  . M-SPIKE, % 01/15/2017 Not Observed  Not Observed % Final  . Immunofixation Result, Urine 01/15/2017 Comment   Final   An apparent normal immunofixation pattern.  [name] NOTE: 01/15/2017 Comment   Final   Comment: Protein electrophoresis scan will follow via computer, mail, or courier delivery.   . Free Kappa Lt Chains,Ur 01/15/2017 178.00* 1.35 - 24.19 mg/L Final   **Results verified by repeat testing**  . Free Lambda Lt Chains,Ur 01/15/2017 21.80* 0.24 - 6.66 mg/L Final  . Kappa/Lambda Ratio,U 01/15/2017 8.17  2.04 - 10.37 Final       Ardath Sax, MD

## 2017-01-26 IMAGING — CR DG CHEST 2V
2 series · 2 of 2 positions shown · non-contrast
Comparison: None.

CLINICAL DATA: Left shoulder pain for 5 days. No known injury.
Initial encounter.

EXAM:
CHEST  2 VIEW

[w chest pa]
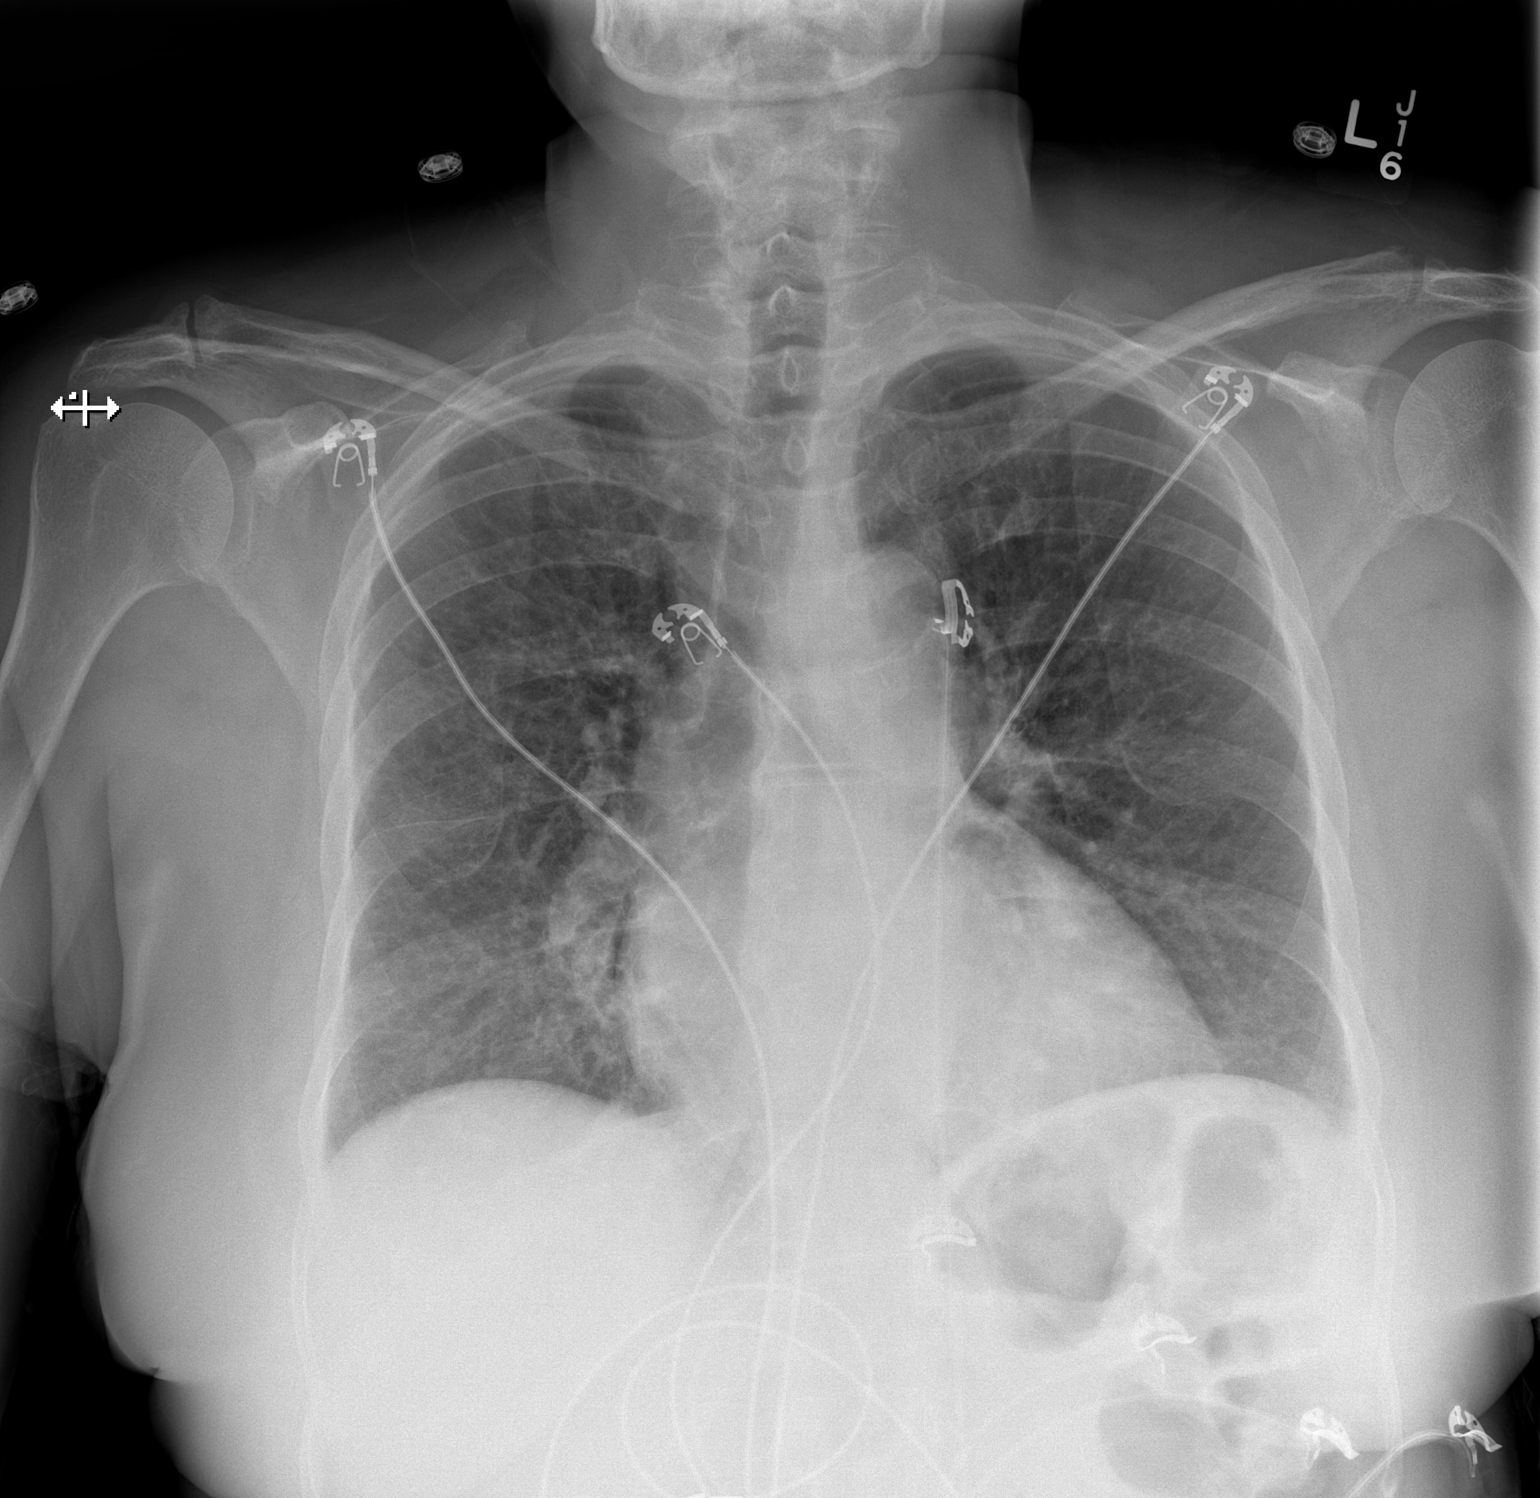

[w chest lat]
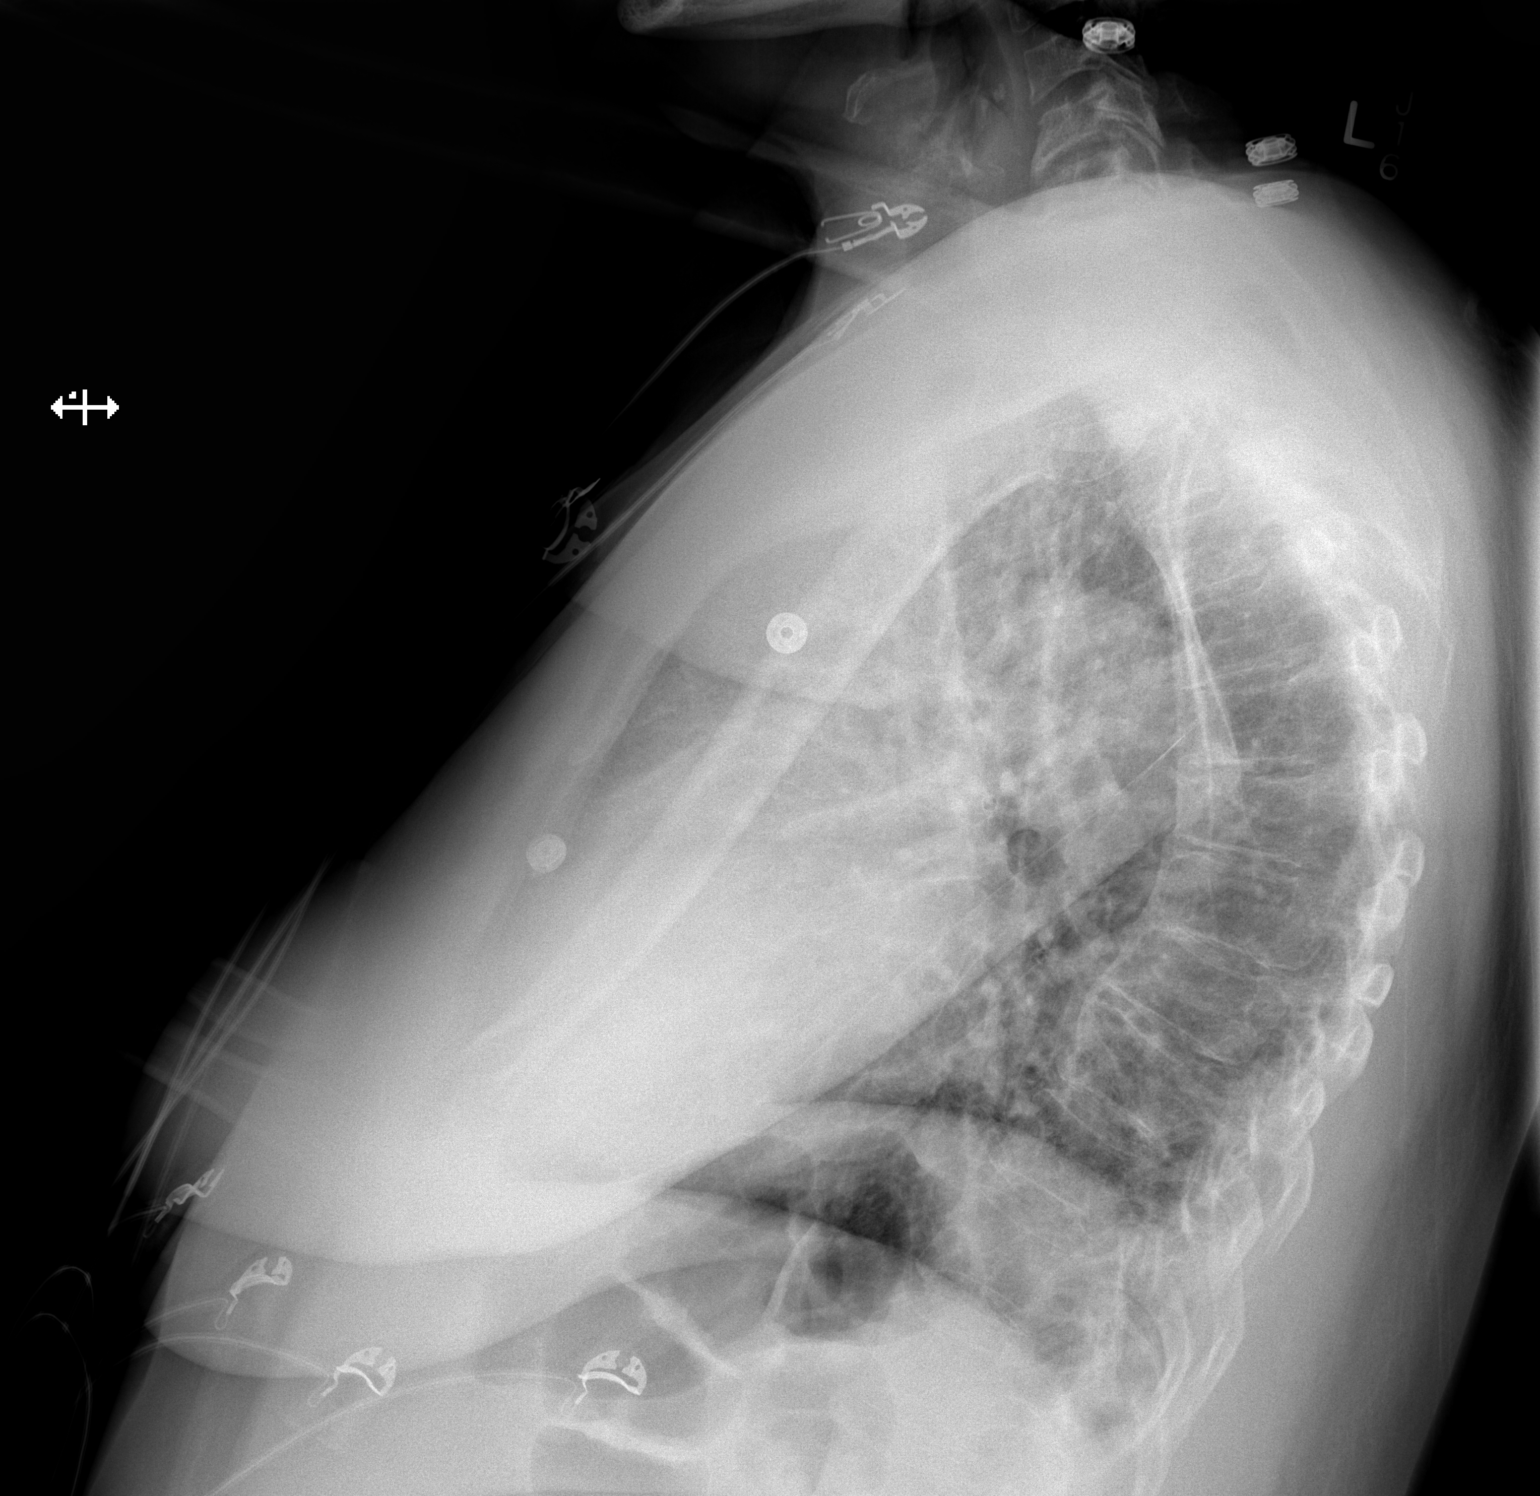

[2 of 2 positions shown; findings below may reference images not displayed]

FINDINGS: The heart size and mediastinal contours are within normal limits.
Both lungs are clear. No pneumothorax or pleural effusion is noted.
The visualized skeletal structures are unremarkable.
IMPRESSION: No acute cardiopulmonary abnormality seen.

## 2017-01-26 IMAGING — CR DG SHOULDER 2+V*L*
2 series · 2 of 2 positions shown · non-contrast
Comparison: None.

CLINICAL DATA: 58-year-old female with left shoulder pain after
have the lifting injury on 08/03/2014. Initial encounter.

EXAM:
LEFT SHOULDER - 2+ VIEW

[w shoulder external left]
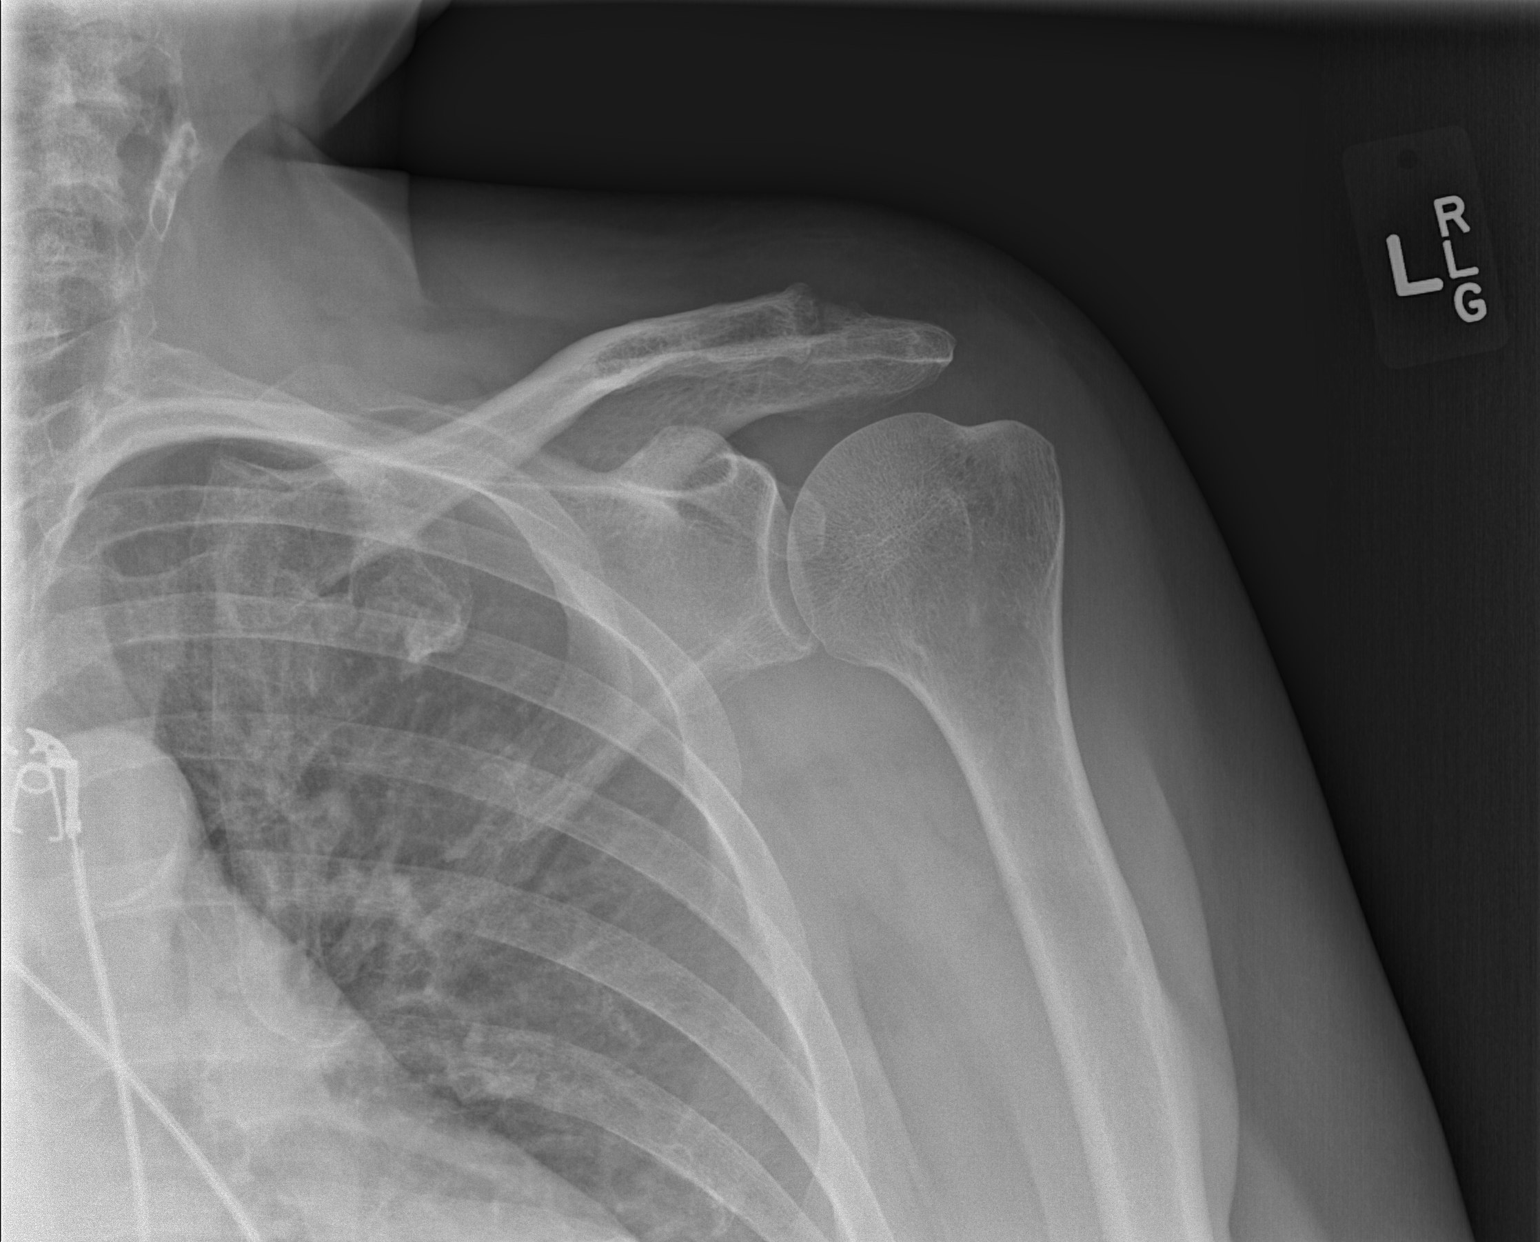

[w shoulder y-view left]
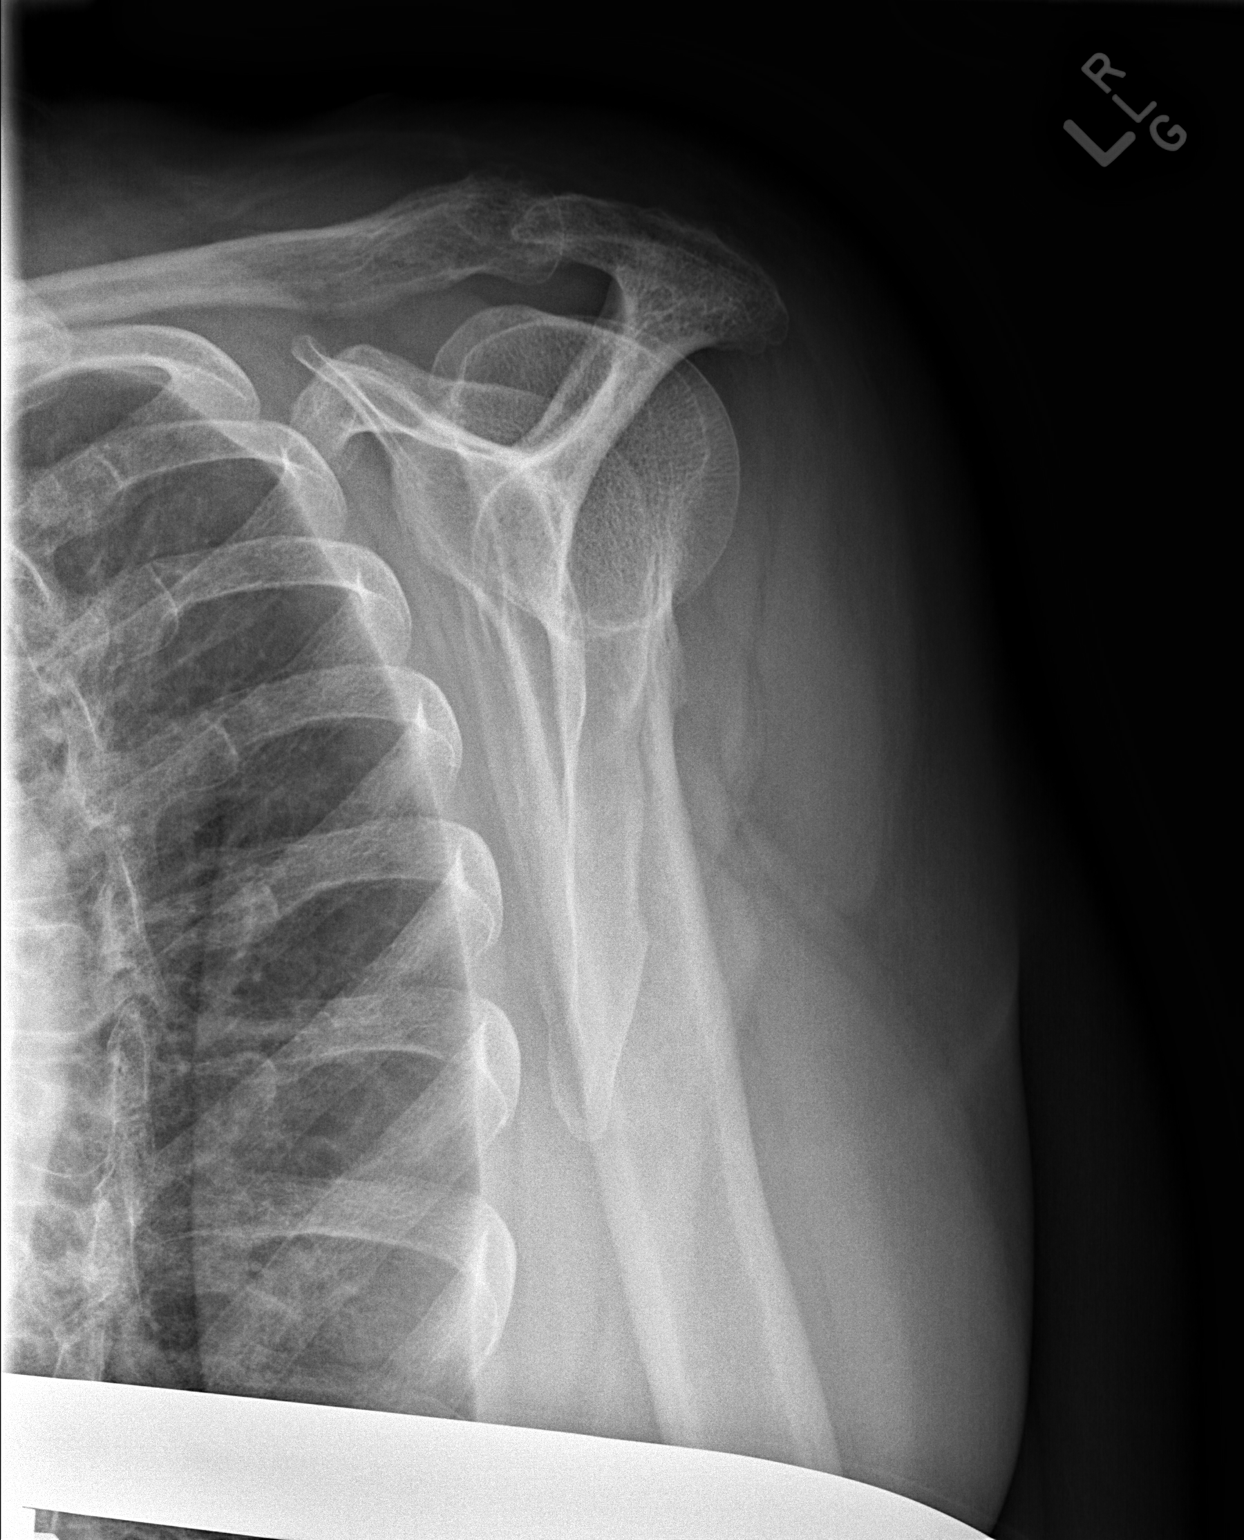

[2 of 2 positions shown; findings below may reference images not displayed]

FINDINGS: Bone mineralization is within normal limits for age. No glenohumeral
joint dislocation. Proximal left humerus intact. Left clavicle and
scapula appear intact. Negative visible left ribs and lung
parenchyma.
IMPRESSION: No acute osseous abnormality identified at the left shoulder.

## 2017-01-27 ENCOUNTER — Telehealth: Payer: Self-pay

## 2017-01-27 NOTE — Telephone Encounter (Signed)
Left a voice message for patient with upcoming appointment for 11/6

## 2017-01-29 ENCOUNTER — Encounter: Payer: Self-pay | Admitting: Obstetrics and Gynecology

## 2017-02-02 ENCOUNTER — Encounter (HOSPITAL_COMMUNITY)
Admission: RE | Admit: 2017-02-02 | Discharge: 2017-02-02 | Disposition: A | Discharge status: Home / Self Care | Payer: Medicare Other | Source: Ambulatory Visit | Attending: Nephrology | Admitting: Nephrology

## 2017-02-02 DIAGNOSIS — D631 Anemia in chronic kidney disease: Secondary | ICD-10-CM | POA: Insufficient documentation

## 2017-02-02 DIAGNOSIS — N184 Chronic kidney disease, stage 4 (severe): Secondary | ICD-10-CM | POA: Insufficient documentation

## 2017-02-02 LAB — FERRITIN: Ferritin: 52 ng/mL (ref 11–307)

## 2017-02-02 LAB — IRON AND TIBC
Iron: 18 ug/dL — ABNORMAL LOW (ref 28–170)
Saturation Ratios: 8 % — ABNORMAL LOW (ref 10.4–31.8)
TIBC: 237 ug/dL — ABNORMAL LOW (ref 250–450)
UIBC: 219 ug/dL

## 2017-02-02 LAB — POCT HEMOGLOBIN-HEMACUE: Hemoglobin: 8.1 g/dL — ABNORMAL LOW (ref 12.0–15.0)

## 2017-02-02 MED ORDER — DARBEPOETIN ALFA 100 MCG/0.5ML IJ SOSY
PREFILLED_SYRINGE | INTRAMUSCULAR | Status: AC
  Filled 2017-02-02: qty 0.5

## 2017-02-02 MED ORDER — DARBEPOETIN ALFA 100 MCG/0.5ML IJ SOSY
100.0000 ug | PREFILLED_SYRINGE | INTRAMUSCULAR | Status: DC
  Administered 2017-02-02: 100 ug via SUBCUTANEOUS

## 2017-02-03 ENCOUNTER — Ambulatory Visit (INDEPENDENT_AMBULATORY_CARE_PROVIDER_SITE_OTHER): Payer: Medicare Other | Admitting: Orthopaedic Surgery

## 2017-02-03 ENCOUNTER — Encounter (INDEPENDENT_AMBULATORY_CARE_PROVIDER_SITE_OTHER): Payer: Self-pay | Admitting: Orthopaedic Surgery

## 2017-02-03 DIAGNOSIS — M25562 Pain in left knee: Secondary | ICD-10-CM

## 2017-02-03 DIAGNOSIS — G8929 Other chronic pain: Secondary | ICD-10-CM | POA: Diagnosis not present

## 2017-02-03 MED ORDER — TRAMADOL HCL 50 MG PO TABS: 50.0000 mg | ORAL_TABLET | Freq: Four times a day (QID) | ORAL | 0 refills | Status: DC | PRN

## 2017-02-03 MED ORDER — LIDOCAINE HCL 1 % IJ SOLN
2.0000 mL | INTRAMUSCULAR | Status: AC | PRN
  Administered 2017-02-03: 2 mL

## 2017-02-03 MED ORDER — BUPIVACAINE HCL 0.5 % IJ SOLN
2.0000 mL | INTRAMUSCULAR | Status: AC | PRN
  Administered 2017-02-03: 2 mL via INTRA_ARTICULAR

## 2017-02-03 MED ORDER — METHYLPREDNISOLONE ACETATE 40 MG/ML IJ SUSP
40.0000 mg | INTRAMUSCULAR | Status: AC | PRN
  Administered 2017-02-03: 40 mg via INTRA_ARTICULAR

## 2017-02-03 NOTE — Progress Notes (Signed)
Office Visit Note   Patient: Brandy Houston           Date of Birth: Aug 25, 1955           MRN: [id number] Visit Date: 02/03/2017              Requested by: Jamal Maes, MD [address] [place], [place] [address] PCP: Jamal Maes, MD   Assessment & Plan: Visit Diagnoses:  1. Chronic pain of left knee     Plan: Left knee was again aspirated and yielded 120 mL of bloody effusion. This was sent to the lab for analysis. We will also obtain inflammatory labs today. She has an appointment with the nephrologist to evaluate her chronic renal insufficiency. The hematologist feels that she is getting recurrent bloody effusions due to her chronic renal insufficiency. We will be in touch about her aspiration results.  Follow-Up Instructions: Return if symptoms worsen or fail to improve.   Orders:  No orders of the defined types were placed in this encounter.  Meds ordered this encounter  Medications  . traMADol (ULTRAM) 50 MG tablet    Sig: Take 1-2 tablets (50-100 mg total) by mouth every 6 (six) hours as needed.    Dispense:  60 tablet    Refill:  0      Procedures: Large Joint Inj Date/Time: 02/03/2017 4:07 PM Performed by: Leandrew Koyanagi Authorized by: Leandrew Koyanagi   Consent Given by:  Patient Timeout: prior to procedure the correct patient, procedure, and site was verified   Indications:  Pain Location:  Knee Site:  L knee Prep: patient was prepped and draped in usual sterile fashion   Needle Size:  22 G Ultrasound Guidance: No   Fluoroscopic Guidance: No   Arthrogram: No   Medications:  2 mL lidocaine 1 %; 2 mL bupivacaine 0.5 %; 40 mg methylPREDNISolone acetate 40 MG/ML Aspiration Attempted: Yes   Aspirate amount (mL):  120 Aspirate:  Bloody Patient tolerance:  Patient tolerated the procedure well with no immediate complications     Clinical Data: No additional findings.   Subjective: Chief Complaint  Patient presents with  . Left Knee - Pain, Follow-up     Brandy Houston comes in for follow-up of her left knee pain. She has had recurrent bloody effusions. She recently saw her hematologist who does not feel that she has a coagulopathy.  Brandy Houston has been off antibiotics for over a year now. She continues to pain and patellofemoral dysfunction due to the large effusions.    Review of Systems  Constitutional: Negative.   HENT: Negative.   Eyes: Negative.   Respiratory: Negative.   Cardiovascular: Negative.   Endocrine: Negative.   Musculoskeletal: Negative.   Neurological: Negative.   Hematological: Negative.   Psychiatric/Behavioral: Negative.   All other systems reviewed and are negative.    Objective: Vital Signs: LMP 03/16/2002   Physical Exam  Constitutional: She is oriented to person, place, and time. She appears well-developed and well-nourished.  Pulmonary/Chest: Effort normal.  Neurological: She is alert and oriented to person, place, and time.  Skin: Skin is warm. Capillary refill takes less than 2 seconds.  Psychiatric: She has a normal mood and affect. Her behavior is normal. Judgment and thought content normal.  Nursing note and vitals reviewed.   Ortho Exam Left knee exam shows a large joint effusion. Surgical scars are fully healed. There is no warmth or cellulitis. Specialty Comments:  No specialty comments available.  Imaging: No results  found.   PMFS History: Patient Active Problem List   Diagnosis Date Noted  . Dysplasia of cervix, high grade CIN 2 01/12/2017  . Hemarthrosis, left knee 12/29/2016  . Elevated total protein 12/29/2016  . ASCUS with positive high risk HPV cervical 12/09/2016  . Chronic diastolic CHF (congestive heart failure) ([place]) 11/08/2015  . Uncontrolled type 2 diabetes mellitus with complication ([place])   . Status post total left knee replacement   . Acute blood loss anemia   . Diabetic peripheral neuropathy ([place])   . Anxiety state   . Depression   . Normocytic anemia 11/07/2015  .  Chronic kidney disease (CKD), stage IV (severe) ([place])   . Postoperative surgical complication involving circulatory system   . Physical debility 09/14/2014  . Acute renal failure syndrome ([place])   . Hepatitis C 08/16/2014  . Septic joint of right hand ([place])   . Septic joint of right knee joint ([place])   . Septic joint of left shoulder region ([place]) 08/07/2014  . Peripheral neuropathy 06/24/2013  . 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 Uncontrolled hypertension 02/18/2013  . Chest pain at rest 10/18/2011    Class: Acute   Past Medical History:  Diagnosis Date  . Anemia   . Anxiety   . Chronic diastolic CHF (congestive heart failure) ([place])   . Chronic kidney disease    stage IV. previous HD, none currently 10/30/15  . Depression    Chronic  . Diabetes mellitus    type 2, IDDM  . Diabetic peripheral neuropathy ([place])   . Dysrhythmia    tachycardia, normal ECHO 08-09-14  . GERD (gastroesophageal reflux disease)   . Hepatitis C 2016  . Hepatitis C 08/16/2014  . Hypertension   . Osteoarthritis of left knee   . Peripheral neuropathy   . Protein calorie malnutrition ([place])   . Septic arthritis ([place])   . Slow transit constipation   . Uncontrolled hypertension 02/18/2013  . Unsteady gait     Family History  Problem Relation Age of Onset  . Cancer Mother   . Heart disease Father   . Cancer Sister   . Hypertension Sister   . Cancer Brother   . Diabetes Brother   . Hypertension Brother     Past Surgical History:  Procedure Laterality Date  . APPENDECTOMY    . AV FISTULA PLACEMENT Left 09/13/2014   Procedure: Brachial Artery to Brachial Vein Gortex Four - Seven Stretch GRAFT INSERTION Left Forearm;  Surgeon: Mal Misty, MD;  Location: [facility];  Service: Vascular;  Laterality: Left;  . CHOLECYSTECTOMY    . KNEE ARTHROSCOPY Right 08/10/2014   Procedure: ARTHROSCOPY I & D KNEE;  Surgeon: Marianna Payment, MD;  Location: [facility];  Service: Orthopedics;  Laterality: Right;  .  KNEE ARTHROSCOPY Left 08/11/2014   Procedure: ARTHROSCOPIC WASHOUT LEFT KNEE;  Surgeon: Marianna Payment, MD;  Location: [facility];  Service: Orthopedics;  Laterality: Left;  . KNEE ARTHROSCOPY Left 08/19/2014   Procedure: ARTHROSCOPIC WASHOUT LEFT KNEE;  Surgeon: Leandrew Koyanagi, MD;  Location: [facility];  Service: Orthopedics;  Laterality: Left;  . KNEE ARTHROSCOPY WITH LATERAL MENISECTOMY Left 04/04/2015   Procedure: AND PARTIAL LATERAL MENISECTOMY;  Surgeon: Leandrew Koyanagi, MD;  Location: [facility];  Service: Orthopedics;  Laterality: Left;  . KNEE ARTHROSCOPY WITH MEDIAL MENISECTOMY Left 04/04/2015   Procedure: LEFT KNEE ARTHROSCOPY WITH PARTIAL MEDIAL MENISCECTOMY  AND SYNOVECTOMY;  Surgeon: Leandrew Koyanagi, MD;  Location: [facility];  Service: Orthopedics;  Laterality: Left;  . SHOULDER ARTHROSCOPY Bilateral 08/10/2014   Procedure: I & D BILATERAL SHOULDERS ;  Surgeon: Marianna Payment, MD;  Location: [facility];  Service: Orthopedics;  Laterality: Bilateral;  . SMALL INTESTINE SURGERY     Due to Small Bowel Obstruction  . TEE WITHOUT CARDIOVERSION N/A 08/14/2014   Procedure: TRANSESOPHAGEAL ECHOCARDIOGRAM (TEE);  Surgeon: Thayer Headings, MD;  Location: [facility];  Service: Cardiovascular;  Laterality: N/A;  . TENOSYNOVECTOMY Right 08/11/2014   Procedure: RIGHT WRIST IRRIGATION AND DEBRIDEMENT, TENOSYNOVECTOMY;  Surgeon: Marianna Payment, MD;  Location: [facility];  Service: Orthopedics;  Laterality: Right;  . TOTAL KNEE ARTHROPLASTY Left 11/07/2015   Procedure: LEFT TOTAL KNEE ARTHROPLASTY WITH REVISION OF IMPLANTS;  Surgeon: Leandrew Koyanagi, MD;  Location: [facility];  Service: Orthopedics;  Laterality: Left;  . TUBAL LIGATION     Social History   Occupational History  . Not on file.   Social History Main Topics  . Smoking status: Current Every Day Smoker    Packs/day: 0.50    Years: 20.00    Types: Cigarettes  . Smokeless tobacco: Never Used     Comment: Smoking 7-8 cigs per  day  . Alcohol use No     Comment: per pt no alcohol since 09/2014  . Drug use: No  . Sexual activity: No

## 2017-02-03 NOTE — Addendum Note (Signed)
Addended by: Precious Bard on: 02/03/2017 04:34 PM   Modules accepted: Orders

## 2017-02-04 ENCOUNTER — Telehealth (INDEPENDENT_AMBULATORY_CARE_PROVIDER_SITE_OTHER): Payer: Self-pay | Admitting: Radiology

## 2017-02-04 LAB — SYNOVIAL CELL COUNT + DIFF, W/ CRYSTALS
Basophils, %: 0 %
Eosinophils-Synovial: 1 % (ref 0–2)
Lymphocytes-Synovial Fld: 0 % (ref 0–74)
Monocyte/Macrophage: 0 % (ref 0–69)
Neutrophil, Synovial: 99 % — ABNORMAL HIGH (ref 0–24)
Synoviocytes, %: 0 % (ref 0–15)
WBC, Synovial: 58610 cells/uL — ABNORMAL HIGH (ref <150)

## 2017-02-04 LAB — GRAM STAIN: Gram Stain: NONE SEEN

## 2017-02-04 NOTE — Progress Notes (Signed)
Patient needs to be posted for I&D TKA, poly exchange, smith and Nephew, stimulan beads, intraop cultures.  Friday after my [facility] case.  Main OR.  Admit postop.  Thanks.

## 2017-02-04 NOTE — Telephone Encounter (Signed)
[place] calling from [facility] about orders received yesterday. They advised synovial fluid was the only specimen sent. Advised they are unable to do crp, sed rate, or cbc testing that this is only offered through serum blood and cannot be performed on synovial fluid. They are going to run synovial fluid culture including ana/aerobic, gram stain, and they will also run cell count with diff and crystals. FYI.

## 2017-02-04 NOTE — Telephone Encounter (Signed)
I called [facility] back and spoke with Hinton Dyer, who advised no blood specimen was received. She transferred me to [phone] for dispatch. Spoke with Larrie Kass, she advised her supervisor will call us back once they are able to further investigate where specimen is. Advised it was put out for collection at the same time as body fluid culture, they were bag separate and order specific was bagged with each specimen.

## 2017-02-04 NOTE — Telephone Encounter (Signed)
I called and left voicemail for Brandy Houston [phone]. Advised that I was returning a call. Received post it message to call him back, hopefully in regards to them finding a specimen.

## 2017-02-05 ENCOUNTER — Encounter (HOSPITAL_COMMUNITY): Payer: Self-pay | Admitting: General Practice

## 2017-02-05 ENCOUNTER — Telehealth (INDEPENDENT_AMBULATORY_CARE_PROVIDER_SITE_OTHER): Payer: Self-pay

## 2017-02-05 ENCOUNTER — Ambulatory Visit (INDEPENDENT_AMBULATORY_CARE_PROVIDER_SITE_OTHER): Payer: Medicare Other | Admitting: Family

## 2017-02-05 ENCOUNTER — Inpatient Hospital Stay (HOSPITAL_COMMUNITY)
Admission: AD | Admit: 2017-02-05 | Discharge: 2017-02-18 | DRG: 464 | Disposition: A | Discharge status: Skilled Nursing Facility | Payer: Medicare Other | Source: Ambulatory Visit | Attending: Family Medicine | Admitting: Family Medicine

## 2017-02-05 ENCOUNTER — Telehealth (INDEPENDENT_AMBULATORY_CARE_PROVIDER_SITE_OTHER): Payer: Self-pay | Admitting: Radiology

## 2017-02-05 VITALS — BP 139/64 | HR 90 | Temp 98.1°F | Resp 18

## 2017-02-05 DIAGNOSIS — N179 Acute kidney failure, unspecified: Secondary | ICD-10-CM | POA: Diagnosis not present

## 2017-02-05 DIAGNOSIS — M2500 Hemarthrosis, unspecified joint: Secondary | ICD-10-CM

## 2017-02-05 DIAGNOSIS — Z8619 Personal history of other infectious and parasitic diseases: Secondary | ICD-10-CM

## 2017-02-05 DIAGNOSIS — Z8249 Family history of ischemic heart disease and other diseases of the circulatory system: Secondary | ICD-10-CM

## 2017-02-05 DIAGNOSIS — D62 Acute posthemorrhagic anemia: Secondary | ICD-10-CM | POA: Diagnosis not present

## 2017-02-05 DIAGNOSIS — Z794 Long term (current) use of insulin: Secondary | ICD-10-CM

## 2017-02-05 DIAGNOSIS — E1122 Type 2 diabetes mellitus with diabetic chronic kidney disease: Secondary | ICD-10-CM | POA: Diagnosis present

## 2017-02-05 DIAGNOSIS — N184 Chronic kidney disease, stage 4 (severe): Secondary | ICD-10-CM | POA: Diagnosis not present

## 2017-02-05 DIAGNOSIS — E1121 Type 2 diabetes mellitus with diabetic nephropathy: Secondary | ICD-10-CM | POA: Diagnosis present

## 2017-02-05 DIAGNOSIS — B1920 Unspecified viral hepatitis C without hepatic coma: Secondary | ICD-10-CM | POA: Diagnosis present

## 2017-02-05 DIAGNOSIS — E118 Type 2 diabetes mellitus with unspecified complications: Secondary | ICD-10-CM | POA: Diagnosis not present

## 2017-02-05 DIAGNOSIS — B182 Chronic viral hepatitis C: Secondary | ICD-10-CM | POA: Diagnosis not present

## 2017-02-05 DIAGNOSIS — K219 Gastro-esophageal reflux disease without esophagitis: Secondary | ICD-10-CM | POA: Diagnosis present

## 2017-02-05 DIAGNOSIS — F329 Major depressive disorder, single episode, unspecified: Secondary | ICD-10-CM | POA: Diagnosis present

## 2017-02-05 DIAGNOSIS — B3749 Other urogenital candidiasis: Secondary | ICD-10-CM | POA: Diagnosis not present

## 2017-02-05 DIAGNOSIS — Z452 Encounter for adjustment and management of vascular access device: Secondary | ICD-10-CM

## 2017-02-05 DIAGNOSIS — Z7982 Long term (current) use of aspirin: Secondary | ICD-10-CM | POA: Diagnosis not present

## 2017-02-05 DIAGNOSIS — R05 Cough: Secondary | ICD-10-CM

## 2017-02-05 DIAGNOSIS — E1142 Type 2 diabetes mellitus with diabetic polyneuropathy: Secondary | ICD-10-CM | POA: Diagnosis present

## 2017-02-05 DIAGNOSIS — Z96652 Presence of left artificial knee joint: Secondary | ICD-10-CM | POA: Diagnosis not present

## 2017-02-05 DIAGNOSIS — E11319 Type 2 diabetes mellitus with unspecified diabetic retinopathy without macular edema: Secondary | ICD-10-CM | POA: Diagnosis present

## 2017-02-05 DIAGNOSIS — I132 Hypertensive heart and chronic kidney disease with heart failure and with stage 5 chronic kidney disease, or end stage renal disease: Secondary | ICD-10-CM | POA: Diagnosis not present

## 2017-02-05 DIAGNOSIS — D631 Anemia in chronic kidney disease: Secondary | ICD-10-CM

## 2017-02-05 DIAGNOSIS — M6281 Muscle weakness (generalized): Secondary | ICD-10-CM | POA: Diagnosis not present

## 2017-02-05 DIAGNOSIS — G629 Polyneuropathy, unspecified: Secondary | ICD-10-CM

## 2017-02-05 DIAGNOSIS — M25562 Pain in left knee: Secondary | ICD-10-CM | POA: Diagnosis not present

## 2017-02-05 DIAGNOSIS — T8454XS Infection and inflammatory reaction due to internal left knee prosthesis, sequela: Secondary | ICD-10-CM | POA: Diagnosis not present

## 2017-02-05 DIAGNOSIS — M009 Pyogenic arthritis, unspecified: Secondary | ICD-10-CM

## 2017-02-05 DIAGNOSIS — D638 Anemia in other chronic diseases classified elsewhere: Secondary | ICD-10-CM | POA: Diagnosis not present

## 2017-02-05 DIAGNOSIS — Z96659 Presence of unspecified artificial knee joint: Secondary | ICD-10-CM

## 2017-02-05 DIAGNOSIS — Z5189 Encounter for other specified aftercare: Secondary | ICD-10-CM | POA: Diagnosis not present

## 2017-02-05 DIAGNOSIS — T8454XD Infection and inflammatory reaction due to internal left knee prosthesis, subsequent encounter: Secondary | ICD-10-CM

## 2017-02-05 DIAGNOSIS — Y831 Surgical operation with implant of artificial internal device as the cause of abnormal reaction of the patient, or of later complication, without mention of misadventure at the time of the procedure: Secondary | ICD-10-CM | POA: Diagnosis present

## 2017-02-05 DIAGNOSIS — R509 Fever, unspecified: Secondary | ICD-10-CM | POA: Diagnosis not present

## 2017-02-05 DIAGNOSIS — R488 Other symbolic dysfunctions: Secondary | ICD-10-CM | POA: Diagnosis not present

## 2017-02-05 DIAGNOSIS — I517 Cardiomegaly: Secondary | ICD-10-CM | POA: Diagnosis not present

## 2017-02-05 DIAGNOSIS — R059 Cough, unspecified: Secondary | ICD-10-CM

## 2017-02-05 DIAGNOSIS — I130 Hypertensive heart and chronic kidney disease with heart failure and stage 1 through stage 4 chronic kidney disease, or unspecified chronic kidney disease: Secondary | ICD-10-CM | POA: Diagnosis not present

## 2017-02-05 DIAGNOSIS — T8454XA Infection and inflammatory reaction due to internal left knee prosthesis, initial encounter: Secondary | ICD-10-CM | POA: Diagnosis not present

## 2017-02-05 DIAGNOSIS — Z471 Aftercare following joint replacement surgery: Secondary | ICD-10-CM | POA: Diagnosis not present

## 2017-02-05 DIAGNOSIS — R262 Difficulty in walking, not elsewhere classified: Secondary | ICD-10-CM | POA: Diagnosis not present

## 2017-02-05 DIAGNOSIS — D72829 Elevated white blood cell count, unspecified: Secondary | ICD-10-CM | POA: Diagnosis not present

## 2017-02-05 DIAGNOSIS — I878 Other specified disorders of veins: Secondary | ICD-10-CM

## 2017-02-05 DIAGNOSIS — N185 Chronic kidney disease, stage 5: Secondary | ICD-10-CM | POA: Diagnosis not present

## 2017-02-05 DIAGNOSIS — F17210 Nicotine dependence, cigarettes, uncomplicated: Secondary | ICD-10-CM | POA: Diagnosis not present

## 2017-02-05 DIAGNOSIS — Z833 Family history of diabetes mellitus: Secondary | ICD-10-CM | POA: Diagnosis not present

## 2017-02-05 DIAGNOSIS — I5032 Chronic diastolic (congestive) heart failure: Secondary | ICD-10-CM | POA: Diagnosis present

## 2017-02-05 DIAGNOSIS — N189 Chronic kidney disease, unspecified: Secondary | ICD-10-CM

## 2017-02-05 DIAGNOSIS — Y838 Other surgical procedures as the cause of abnormal reaction of the patient, or of later complication, without mention of misadventure at the time of the procedure: Secondary | ICD-10-CM | POA: Diagnosis not present

## 2017-02-05 DIAGNOSIS — I129 Hypertensive chronic kidney disease with stage 1 through stage 4 chronic kidney disease, or unspecified chronic kidney disease: Secondary | ICD-10-CM | POA: Diagnosis not present

## 2017-02-05 HISTORY — DX: Chronic kidney disease, stage 4 (severe): N18.4

## 2017-02-05 HISTORY — DX: Unspecified osteoarthritis, unspecified site: M19.90

## 2017-02-05 HISTORY — DX: Cardiac murmur, unspecified: R01.1

## 2017-02-05 HISTORY — DX: Type 2 diabetes mellitus without complications: E11.9

## 2017-02-05 HISTORY — DX: Personal history of other medical treatment: Z92.89

## 2017-02-05 LAB — CBC WITH DIFFERENTIAL/PLATELET
Basophils Absolute: 0 10*3/uL (ref 0.0–0.1)
Basophils Relative: 0 %
Eosinophils Absolute: 0.1 10*3/uL (ref 0.0–0.7)
Eosinophils Relative: 1 %
HCT: 22.1 % — ABNORMAL LOW (ref 36.0–46.0)
Hemoglobin: 7.1 g/dL — ABNORMAL LOW (ref 12.0–15.0)
Lymphocytes Relative: 11 %
Lymphs Abs: 1.2 10*3/uL (ref 0.7–4.0)
MCH: 27.2 pg (ref 26.0–34.0)
MCHC: 32.1 g/dL (ref 30.0–36.0)
MCV: 84.7 fL (ref 78.0–100.0)
Monocytes Absolute: 1.3 10*3/uL — ABNORMAL HIGH (ref 0.1–1.0)
Monocytes Relative: 13 %
Neutro Abs: 7.7 10*3/uL (ref 1.7–7.7)
Neutrophils Relative %: 75 %
Platelets: 521 10*3/uL — ABNORMAL HIGH (ref 150–400)
RBC: 2.61 MIL/uL — ABNORMAL LOW (ref 3.87–5.11)
RDW: 14.7 % (ref 11.5–15.5)
WBC: 10.3 10*3/uL (ref 4.0–10.5)

## 2017-02-05 LAB — COMPREHENSIVE METABOLIC PANEL
ALT: 20 U/L (ref 14–54)
AST: 16 U/L (ref 15–41)
Albumin: 2.6 g/dL — ABNORMAL LOW (ref 3.5–5.0)
Alkaline Phosphatase: 117 U/L (ref 38–126)
Anion gap: 12 (ref 5–15)
BUN: 79 mg/dL — ABNORMAL HIGH (ref 6–20)
CO2: 21 mmol/L — ABNORMAL LOW (ref 22–32)
Calcium: 8.7 mg/dL — ABNORMAL LOW (ref 8.9–10.3)
Chloride: 101 mmol/L (ref 101–111)
Creatinine, Ser: 3.87 mg/dL — ABNORMAL HIGH (ref 0.44–1.00)
GFR calc Af Amer: 13 mL/min — ABNORMAL LOW (ref >60)
GFR calc non Af Amer: 12 mL/min — ABNORMAL LOW (ref >60)
Glucose, Bld: 180 mg/dL — ABNORMAL HIGH (ref 65–99)
Potassium: 3.4 mmol/L — ABNORMAL LOW (ref 3.5–5.1)
Sodium: 134 mmol/L — ABNORMAL LOW (ref 135–145)
Total Bilirubin: 0.3 mg/dL (ref 0.3–1.2)
Total Protein: 7.8 g/dL (ref 6.5–8.1)

## 2017-02-05 LAB — GLUCOSE, CAPILLARY
Glucose-Capillary: 206 mg/dL — ABNORMAL HIGH (ref 65–99)
Glucose-Capillary: 124 mg/dL — ABNORMAL HIGH (ref 65–99)

## 2017-02-05 LAB — RAPID URINE DRUG SCREEN, HOSP PERFORMED
Amphetamines: NOT DETECTED
Barbiturates: NOT DETECTED
Benzodiazepines: NOT DETECTED
Cocaine: NOT DETECTED
Opiates: NOT DETECTED
Tetrahydrocannabinol: NOT DETECTED

## 2017-02-05 LAB — MAGNESIUM: Magnesium: 2.1 mg/dL (ref 1.7–2.4)

## 2017-02-05 LAB — PREPARE RBC (CROSSMATCH)

## 2017-02-05 LAB — HEMOGLOBIN A1C
Hgb A1c MFr Bld: 6.2 % — ABNORMAL HIGH (ref 4.8–5.6)
Mean Plasma Glucose: 131.24 mg/dL

## 2017-02-05 LAB — SPECIMEN STATUS REPORT

## 2017-02-05 MED ORDER — POTASSIUM CHLORIDE CRYS ER 20 MEQ PO TBCR
40.0000 meq | EXTENDED_RELEASE_TABLET | Freq: Once | ORAL | Status: AC
  Administered 2017-02-05: 40 meq via ORAL
  Filled 2017-02-05: qty 2

## 2017-02-05 MED ORDER — METOPROLOL SUCCINATE ER 50 MG PO TB24
50.0000 mg | ORAL_TABLET | Freq: Two times a day (BID) | ORAL | Status: DC
  Administered 2017-02-05 – 2017-02-07 (×3): 50 mg via ORAL
  Filled 2017-02-05 (×3): qty 1

## 2017-02-05 MED ORDER — TRAMADOL HCL 50 MG PO TABS
50.0000 mg | ORAL_TABLET | Freq: Four times a day (QID) | ORAL | Status: DC | PRN
Start: 2017-02-05 — End: 2017-02-18
  Administered 2017-02-14: 50 mg via ORAL
  Filled 2017-02-05 (×2): qty 1

## 2017-02-05 MED ORDER — NICOTINE 14 MG/24HR TD PT24
14.0000 mg | MEDICATED_PATCH | Freq: Every day | TRANSDERMAL | Status: DC
  Administered 2017-02-05 – 2017-02-18 (×14): 14 mg via TRANSDERMAL
  Filled 2017-02-05 (×14): qty 1

## 2017-02-05 MED ORDER — FUROSEMIDE 80 MG PO TABS: 160.0000 mg | ORAL_TABLET | Freq: Two times a day (BID) | ORAL | Status: DC

## 2017-02-05 MED ORDER — ACETAMINOPHEN 500 MG PO TABS
1000.0000 mg | ORAL_TABLET | Freq: Four times a day (QID) | ORAL | Status: DC | PRN
Start: 2017-02-05 — End: 2017-02-18
  Administered 2017-02-08 (×2): 1000 mg via ORAL
  Filled 2017-02-05 (×2): qty 2

## 2017-02-05 MED ORDER — INSULIN GLARGINE 100 UNIT/ML ~~LOC~~ SOLN
15.0000 [IU] | Freq: Every day | SUBCUTANEOUS | Status: DC
  Administered 2017-02-07 – 2017-02-18 (×12): 15 [IU] via SUBCUTANEOUS
  Filled 2017-02-05 (×18): qty 0.15

## 2017-02-05 MED ORDER — METHOCARBAMOL 500 MG PO TABS
500.0000 mg | ORAL_TABLET | Freq: Two times a day (BID) | ORAL | Status: DC
  Administered 2017-02-05 – 2017-02-18 (×26): 500 mg via ORAL
  Filled 2017-02-05 (×25): qty 1

## 2017-02-05 MED ORDER — SENNOSIDES-DOCUSATE SODIUM 8.6-50 MG PO TABS
2.0000 | ORAL_TABLET | Freq: Every day | ORAL | Status: DC
  Administered 2017-02-07 – 2017-02-16 (×7): 2 via ORAL
  Filled 2017-02-05 (×12): qty 2

## 2017-02-05 MED ORDER — ONDANSETRON HCL 4 MG/2ML IJ SOLN
4.0000 mg | Freq: Four times a day (QID) | INTRAMUSCULAR | Status: DC | PRN
  Administered 2017-02-06 – 2017-02-12 (×2): 4 mg via INTRAVENOUS
  Filled 2017-02-05 (×2): qty 2

## 2017-02-05 MED ORDER — SODIUM CHLORIDE 0.9 % IV SOLN: 250.0000 mL | INTRAVENOUS | Status: DC | PRN

## 2017-02-05 MED ORDER — SODIUM CHLORIDE 0.9% FLUSH: 3.0000 mL | INTRAVENOUS | Status: DC | PRN

## 2017-02-05 MED ORDER — FERROUS SULFATE 325 (65 FE) MG PO TABS
325.0000 mg | ORAL_TABLET | Freq: Two times a day (BID) | ORAL | Status: DC
  Administered 2017-02-05 – 2017-02-18 (×25): 325 mg via ORAL
  Filled 2017-02-05 (×26): qty 1

## 2017-02-05 MED ORDER — CALCIUM CARBONATE ANTACID 500 MG PO CHEW
1.0000 | CHEWABLE_TABLET | Freq: Three times a day (TID) | ORAL | Status: DC | PRN
Start: 2017-02-05 — End: 2017-02-18

## 2017-02-05 MED ORDER — CALCITRIOL 0.25 MCG PO CAPS
0.2500 ug | ORAL_CAPSULE | ORAL | Status: DC
  Administered 2017-02-09 – 2017-02-18 (×5): 0.25 ug via ORAL
  Filled 2017-02-05 (×7): qty 1

## 2017-02-05 MED ORDER — SERTRALINE HCL 100 MG PO TABS
100.0000 mg | ORAL_TABLET | Freq: Every day | ORAL | Status: DC
  Administered 2017-02-07 – 2017-02-18 (×12): 100 mg via ORAL
  Filled 2017-02-05 (×12): qty 1

## 2017-02-05 MED ORDER — INSULIN ASPART 100 UNIT/ML ~~LOC~~ SOLN
0.0000 [IU] | Freq: Three times a day (TID) | SUBCUTANEOUS | Status: DC
  Administered 2017-02-05: 3 [IU] via SUBCUTANEOUS
  Administered 2017-02-07 – 2017-02-11 (×4): 1 [IU] via SUBCUTANEOUS
  Administered 2017-02-12: 2 [IU] via SUBCUTANEOUS
  Administered 2017-02-13 – 2017-02-14 (×2): 1 [IU] via SUBCUTANEOUS
  Administered 2017-02-14 (×2): 2 [IU] via SUBCUTANEOUS
  Administered 2017-02-15: 1 [IU] via SUBCUTANEOUS
  Administered 2017-02-16: 2 [IU] via SUBCUTANEOUS
  Administered 2017-02-16 – 2017-02-18 (×4): 1 [IU] via SUBCUTANEOUS

## 2017-02-05 MED ORDER — SODIUM CHLORIDE 0.9% FLUSH
3.0000 mL | Freq: Two times a day (BID) | INTRAVENOUS | Status: DC
  Administered 2017-02-06 – 2017-02-07 (×2): 3 mL via INTRAVENOUS

## 2017-02-05 MED ORDER — ACETAMINOPHEN 650 MG RE SUPP: 650.0000 mg | Freq: Four times a day (QID) | RECTAL | Status: DC | PRN

## 2017-02-05 MED ORDER — ENSURE ENLIVE PO LIQD: 237.0000 mL | Freq: Two times a day (BID) | ORAL | Status: DC

## 2017-02-05 MED ORDER — POTASSIUM CHLORIDE ER 10 MEQ PO TBCR
20.0000 meq | EXTENDED_RELEASE_TABLET | Freq: Two times a day (BID) | ORAL | Status: DC
  Filled 2017-02-05: qty 2

## 2017-02-05 MED ORDER — HYDRALAZINE HCL 50 MG PO TABS
100.0000 mg | ORAL_TABLET | Freq: Two times a day (BID) | ORAL | Status: DC
  Administered 2017-02-07: 100 mg via ORAL
  Filled 2017-02-05 (×2): qty 2

## 2017-02-05 MED ORDER — ACETAMINOPHEN 325 MG PO TABS
650.0000 mg | ORAL_TABLET | Freq: Four times a day (QID) | ORAL | Status: DC | PRN
  Administered 2017-02-07: 650 mg via ORAL
  Filled 2017-02-05: qty 2

## 2017-02-05 MED ORDER — PANTOPRAZOLE SODIUM 40 MG PO TBEC
40.0000 mg | DELAYED_RELEASE_TABLET | Freq: Every day | ORAL | Status: DC
  Administered 2017-02-07 – 2017-02-18 (×12): 40 mg via ORAL
  Filled 2017-02-05 (×12): qty 1

## 2017-02-05 MED ORDER — ADULT MULTIVITAMIN W/MINERALS CH
1.0000 | ORAL_TABLET | Freq: Every day | ORAL | Status: DC
  Administered 2017-02-07 – 2017-02-18 (×12): 1 via ORAL
  Filled 2017-02-05 (×12): qty 1

## 2017-02-05 MED ORDER — MORPHINE SULFATE (PF) 4 MG/ML IV SOLN
2.0000 mg | INTRAVENOUS | Status: DC | PRN
  Administered 2017-02-05 – 2017-02-06 (×4): 2 mg via INTRAVENOUS
  Filled 2017-02-05 (×4): qty 1

## 2017-02-05 MED ORDER — SODIUM CHLORIDE 0.9 % IV SOLN
Freq: Once | INTRAVENOUS | Status: AC
  Administered 2017-02-05: 21:00:00 via INTRAVENOUS

## 2017-02-05 MED ORDER — METOLAZONE 2.5 MG PO TABS: 2.5000 mg | ORAL_TABLET | Freq: Every day | ORAL | Status: DC

## 2017-02-05 MED ORDER — DIPHENHYDRAMINE HCL 25 MG PO CAPS
25.0000 mg | ORAL_CAPSULE | Freq: Four times a day (QID) | ORAL | Status: DC | PRN
  Administered 2017-02-07 – 2017-02-17 (×10): 25 mg via ORAL
  Filled 2017-02-05 (×11): qty 1

## 2017-02-05 MED ORDER — FUROSEMIDE 10 MG/ML IJ SOLN
40.0000 mg | Freq: Once | INTRAMUSCULAR | Status: AC
  Administered 2017-02-06: 40 mg via INTRAVENOUS
  Filled 2017-02-05: qty 4

## 2017-02-05 MED ORDER — GABAPENTIN 100 MG PO CAPS
100.0000 mg | ORAL_CAPSULE | Freq: Three times a day (TID) | ORAL | Status: DC
  Administered 2017-02-05 – 2017-02-18 (×37): 100 mg via ORAL
  Filled 2017-02-05 (×37): qty 1

## 2017-02-05 MED ORDER — HYDROCODONE-ACETAMINOPHEN 5-325 MG PO TABS
1.0000 | ORAL_TABLET | ORAL | Status: DC | PRN
  Administered 2017-02-05 – 2017-02-07 (×3): 2 via ORAL
  Filled 2017-02-05 (×5): qty 2

## 2017-02-05 MED ORDER — INSULIN GLARGINE 100 UNIT/ML ~~LOC~~ SOLN: 15.0000 [IU] | Freq: Every day | SUBCUTANEOUS | Status: DC

## 2017-02-05 MED ORDER — ONDANSETRON HCL 4 MG PO TABS: 4.0000 mg | ORAL_TABLET | Freq: Four times a day (QID) | ORAL | Status: DC | PRN

## 2017-02-05 MED ORDER — AMLODIPINE BESYLATE 10 MG PO TABS
10.0000 mg | ORAL_TABLET | Freq: Every day | ORAL | Status: DC
  Administered 2017-02-05: 10 mg via ORAL
  Filled 2017-02-05: qty 1

## 2017-02-05 NOTE — Telephone Encounter (Signed)
[place] called from [facility].  The incorrect test code for the sed rate was listed. The correct test code is [phone].  [facility] did not have this bloodwork, not sure how [facility] ended up with it.  I will call to followup.

## 2017-02-05 NOTE — Telephone Encounter (Signed)
[facility] lab does not have blood work only fluid and states that's blood work ended up in [facility]. Blood work was done 02/03/17. And was sent off as well as the knee fluid. Don't know why it ended up in [place]   Will you need for [place] to re do blood work or will you have patient get this done at the hospital.   Please advise.

## 2017-02-05 NOTE — H&P (Signed)
[facility] History and Physical  Brandy Houston XMI:[id number] DOB: 03/31/1956 DOA: 02/05/2017  PCP: [name], MD  Patient coming from: Dr. [name] (orthopedic) / Home  Chief Complaint: Left knee pain  HPI: Brandy Houston is a 61 y.o. female with a medical history of diastolic heart failure, chronic kidney disease, diabetes, hypertension, septic arthritis, presented to the hospital from Dr. [name] office (ortho). Patient presented to his office on 02/03/2017 for left knee pain, status post aspiration. Fluid showed greater than 58,000 WBC. Patient states over the past several months, she's been having problems walking and her knee has been popping out of place. Currently patient denies any chest pain, shortness breath, abdominal pain, nausea vomiting, diarrhea or constipation, dizziness or headache. She denies any recent trauma or falls. Denies any recent travel or ill contacts. Patient has been seeing a hematologist for hemarthrosis and has had several bloody knee effusions. Per documentation, unlikely to be coagulopathy. Patient has not been on any antibiotics recently.  ED Course: None- Direct Admission from Dr. [name] office.   Review of Systems:  All other systems reviewed and are negative.   Past Medical History:  Diagnosis Date  . Anemia   . Anxiety   . Arthritis    "in my joints" (02/05/2017)  . Chronic diastolic CHF (congestive heart failure) ([place])   . CKD (chronic kidney disease), stage IV ([name])    stage IV. previous HD, none currently 10/30/15 (confirmed 02/05/2017)  . Depression    Chronic  . Diabetic peripheral neuropathy ([place])   . Dysrhythmia    tachycardia, normal ECHO 08-09-14  . GERD (gastroesophageal reflux disease)   . Heart murmur    "just dx'd today" (02/05/2017)  . Hepatitis C    "tx'd in 2016; I'm negative now" (02/05/2017)  . History of blood transfusion 2017   "w/knee replacement"  . Hypertension   . Osteoarthritis of left knee   . Peripheral neuropathy     . Protein calorie malnutrition ([place])   . Septic arthritis ([place])   . Slow transit constipation   . Type II diabetes mellitus ([name])    IDDM  . Uncontrolled hypertension 02/18/2013  . Unsteady gait     Past Surgical History:  Procedure Laterality Date  . APPENDECTOMY    . AV FISTULA PLACEMENT Left 09/13/2014   Procedure: Brachial Artery to Brachial Vein Gortex Four - Seven Stretch GRAFT INSERTION Left Forearm;  Surgeon: Mal Misty, MD;  Location: [place];  Service: Vascular;  Laterality: Left;  . CHOLECYSTECTOMY OPEN    . COLON SURGERY    . KNEE ARTHROSCOPY Right 08/10/2014   Procedure: ARTHROSCOPY I & D KNEE;  Surgeon: Marianna Payment, MD;  [facility];  Service: Orthopedics;  Laterality: Right;  . KNEE ARTHROSCOPY Left 08/11/2014   Procedure: ARTHROSCOPIC WASHOUT LEFT KNEE;  Surgeon: Marianna Payment, MD;  Location: [place];  Service: Orthopedics;  Laterality: Left;  . KNEE ARTHROSCOPY Left 08/19/2014   Procedure: ARTHROSCOPIC WASHOUT LEFT KNEE;  Surgeon: Leandrew Koyanagi, MD;  Location: [place];  Service: Orthopedics;  Laterality: Left;  . KNEE ARTHROSCOPY WITH LATERAL MENISECTOMY Left 04/04/2015   Procedure: AND PARTIAL LATERAL MENISECTOMY;  Surgeon: Leandrew Koyanagi, MD;  Location: [place];  Service: Orthopedics;  Laterality: Left;  . KNEE ARTHROSCOPY WITH MEDIAL MENISECTOMY Left 04/04/2015   Procedure: LEFT KNEE ARTHROSCOPY WITH PARTIAL MEDIAL MENISCECTOMY  AND SYNOVECTOMY;  Surgeon: Leandrew Koyanagi, MD;  Location: [place];  Service: Orthopedics;  Laterality: Left;  . SHOULDER ARTHROSCOPY Bilateral 08/10/2014   Procedure: I & D BILATERAL SHOULDERS ;  Surgeon: Marianna Payment, MD;  [facility];  Service: Orthopedics;  Laterality: Bilateral;  . SMALL INTESTINE SURGERY     Due to Small Bowel Obstruction; "fixed it when they did my gallbladder OR"  . TEE WITHOUT CARDIOVERSION N/A 08/14/2014   Procedure: TRANSESOPHAGEAL ECHOCARDIOGRAM (TEE);  Surgeon:  Thayer Headings, MD;  Location: [place];  Service: Cardiovascular;  Laterality: N/A;  . TENOSYNOVECTOMY Right 08/11/2014   Procedure: RIGHT WRIST IRRIGATION AND DEBRIDEMENT, TENOSYNOVECTOMY;  Surgeon: Marianna Payment, MD;  Location: [place] [facility];  Service: Orthopedics;  Laterality: Right;  . TOTAL KNEE ARTHROPLASTY Left 11/07/2015   Procedure: LEFT TOTAL KNEE ARTHROPLASTY WITH REVISION OF IMPLANTS;  Surgeon: Leandrew Koyanagi, MD;  Location: [place] [facility];  Service: Orthopedics;  Laterality: Left;  . TUBAL LIGATION      Social History:  reports that she has been smoking Cigarettes.  She has a 17.50 pack-year smoking history. She has never used smokeless tobacco. She reports that she does not drink alcohol or use drugs.   Allergies  Allergen Reactions  . Compazine [Prochlorperazine] Shortness Of Breath and Swelling    TONGUE SWELLS  . Shellfish-Derived Products Anaphylaxis  . Iodinated Diagnostic Agents Hives and Rash  . Omnipaque [Iohexol] Hives    Family History  Problem Relation Age of Onset  . Cancer Mother   . Heart disease Father   . Cancer Sister   . Hypertension Sister   . Cancer Brother   . Diabetes Brother   . Hypertension Brother     Prior to Admission medications   Medication Sig Start Date End Date Taking? Authorizing Provider  acetaminophen (TYLENOL) 500 MG tablet Take 1,000 mg by mouth every 6 (six) hours as needed for mild pain.   Yes [provider]  amLODipine (NORVASC) 10 MG tablet Take 10 mg by mouth at bedtime.    Yes [provider]  aspirin EC 81 MG tablet Take 81 mg by mouth daily.   Yes [provider]  Blood Glucose Monitoring Suppl (ACCU-CHEK AVIVA PLUS) W/DEVICE KIT Check sugars TID for E11.65 01/29/15  Yes Chari Manning A, NP  calcitRIOL (ROCALTROL) 0.25 MCG capsule Take 0.25 mcg by mouth every Monday, Wednesday, and Friday.   Yes [provider]  calcium carbonate (TUMS EX) 750 MG chewable tablet Chew 1 tablet by mouth 3 (three)  times daily as needed for heartburn.    Yes [provider]  Darbepoetin Alfa (ARANESP) 100 MCG/0.5ML SOSY injection Inject 100 mcg into the skin every 30 (thirty) days.   Yes [provider]  diphenhydrAMINE (BENADRYL) 25 mg capsule Take 1 capsule (25 mg total) by mouth every 6 (six) hours as needed for itching. 09/28/14  Yes Love, Ivan Anchors, PA-C  ferrous sulfate 325 (65 FE) MG tablet Take 325 mg by mouth 2 (two) times daily with a meal.   Yes [provider]  furosemide (LASIX) 80 MG tablet Take 160 mg by mouth 2 (two) times daily.    Yes [provider]  gabapentin (NEURONTIN) 100 MG capsule Take 1 capsule (100 mg total) by mouth 2 (two) times daily. Patient taking differently: Take 100 mg by mouth 3 (three) times daily.  12/06/14  Yes Lance Bosch, NP  glucose blood (ACCU-CHEK AVIVA) test strip Check sugars TID for E11.65 Patient taking differently: 1 each by Other route 2 (two) times daily.  Check sugars TID for E11.65 01/29/15  Yes Chari Manning A, NP  hydrALAZINE (APRESOLINE) 100 MG tablet Take 100 mg by mouth 2 (two) times daily.  10/17/15  Yes [provider]  insulin glargine (LANTUS) 100 UNIT/ML injection Inject 15 Units into the skin daily before breakfast.    Yes [provider]  Lancet Devices (ACCU-CHEK SOFTCLIX) lancets Check sugars TID for E11.65 Patient taking differently: 1 each by Other route 2 (two) times daily. Check sugars TID for E11.65 01/29/15  Yes Chari Manning A, NP  methocarbamol (ROBAXIN) 500 MG tablet TAKE 1 TABLET BY MOUTH EVERY 6 HOURS AS NEEDED FOR MUSCLE SPASM 01/19/17  Yes Leandrew Koyanagi, MD  metolazone (ZAROXOLYN) 2.5 MG tablet Take 2.5 mg by mouth daily.   Yes [provider]  metoprolol succinate (TOPROL-XL) 50 MG 24 hr tablet Take 50 mg by mouth 2 (two) times daily.  10/17/15  Yes [provider]  Multiple Vitamin (MULTIVITAMIN WITH MINERALS) TABS tablet Take 1 tablet by mouth daily.   Yes [provider]  omeprazole (PRILOSEC) 20 MG capsule Take 20 mg by mouth daily before breakfast.  10/04/15  Yes [provider]  Potassium Chloride ER 20 MEQ TBCR Take 20 mEq by mouth 2 (two) times daily.  10/04/15  Yes [provider]  sertraline (ZOLOFT) 100 MG tablet Take 1 tablet (100 mg total) by mouth daily. 03/15/15  Yes Carlyle Basques, MD  traMADol (ULTRAM) 50 MG tablet Take 1-2 tablets (50-100 mg total) by mouth every 6 (six) hours as needed. Patient taking differently: Take 50-100 mg by mouth every 6 (six) hours as needed (for pain.).  02/03/17  Yes Leandrew Koyanagi, MD  Diclofenac Sodium 2 % SOLN Place 2 g onto the skin 2 (two) times daily as needed. 08/19/16   Leandrew Koyanagi, MD  sennosides-docusate sodium (SENOKOT-S) 8.6-50 MG tablet Take 2 tablets by mouth at bedtime.    [provider]    Physical Exam: Vitals:   02/05/17 1643  BP: 140/60  Pulse: 74  Resp: 18  Temp: 98.4 F (36.9 C)  SpO2: 97%     General: Well developed, well nourished, NAD, appears stated age  HEENT: NCAT, PERRLA, EOMI, Anicteic Sclera, mucous membranes moist.   Neck: Supple, no JVD, no masses  Cardiovascular: S1 S2 auscultated, 2/6 SEM, RRR  Respiratory: Clear to auscultation bilaterally with equal chest rise  Abdomen: Soft, nontender, nondistended, + bowel sounds  Extremities: Left knee with edema, TTP, well-healed surgical scars. Right lower extremity no clubbing cyanosis or edema.  Neuro: AAOx3, cranial nerves grossly intact. Strength 5/5 in patient's upper and lower extremities bilaterally  Skin: Without rashes exudates or nodules  Psych: Normal affect and demeanor with intact judgement and insight  Labs on Admission: I have personally reviewed following labs and imaging studies CBC:  Recent Labs Lab 02/02/17 1218 02/03/17 1635  WBC  --  12.4*  NEUTROABS  --  9.3*  HGB 8.1* 7.7*  HCT  --  24.9*  MCV  --  92   Basic Metabolic Panel: No results for input(s):  NA, K, CL, CO2, GLUCOSE, BUN, CREATININE, CALCIUM, MG, PHOS in the last 168 hours. GFR: CrCl cannot be calculated (Patient's most recent lab result is older than the maximum 21 days allowed.). Liver Function Tests: No results for input(s): AST, ALT, ALKPHOS, BILITOT, PROT, ALBUMIN in the last 168 hours. No results for input(s): LIPASE, AMYLASE in the last 168 hours. No results for input(s): AMMONIA  in the last 168 hours. Coagulation Profile: No results for input(s): INR, PROTIME in the last 168 hours. Cardiac Enzymes: No results for input(s): CKTOTAL, CKMB, CKMBINDEX, TROPONINI in the last 168 hours. BNP (last 3 results) No results for input(s): PROBNP in the last 8760 hours. HbA1C: No results for input(s): HGBA1C in the last 72 hours. CBG: No results for input(s): GLUCAP in the last 168 hours. Lipid Profile: No results for input(s): CHOL, HDL, LDLCALC, TRIG, CHOLHDL, LDLDIRECT in the last 72 hours. Thyroid Function Tests: No results for input(s): TSH, T4TOTAL, FREET4, T3FREE, THYROIDAB in the last 72 hours. Anemia Panel: No results for input(s): VITAMINB12, FOLATE, FERRITIN, TIBC, IRON, RETICCTPCT in the last 72 hours. Urine analysis:    Component Value Date/Time   COLORURINE YELLOW 10/29/2015 [place] 10/29/2015 1425   LABSPEC 1.017 10/29/2015 1425   PHURINE 5.5 10/29/2015 1425   GLUCOSEU NEGATIVE 10/29/2015 1425   HGBUR NEGATIVE 10/29/2015 1425   BILIRUBINUR NEGATIVE 10/29/2015 1425   KETONESUR NEGATIVE 10/29/2015 1425   PROTEINUR 100 (A) 10/29/2015 1425   UROBILINOGEN 0.2 09/02/2014 1504   NITRITE NEGATIVE 10/29/2015 1425   LEUKOCYTESUR SMALL (A) 10/29/2015 1425   Sepsis Labs: '@LABRCNTIP' (procalcitonin:4,lacticidven:4) ) Recent Results (from the past 240 hour(s))  Gram stain     Status: None   Collection Time: 02/03/17  4:34 PM  Result Value Ref Range Status   Gram Stain Moderate  Final   Gram Stain WBC present-predominately PMN  Final   Gram Stain  No Organisms Seen  Final     Radiological Exams on Admission: No results found.  EKG: None  Assessment/Plan  Left knee pain with edema -Concern for septic joint -Status post left knee arthroplasty in May 2017 by Dr. [name] -Patient was directly admitted from his office today. -Plan for total knee revision 02/06/2017 -Will make NPO after midnight -No antibiotics for now -s/p aspiration yielding 120 mL's of bloody fluid (on 02/03/2017) -Fluid shows 58,000 WBC. Gram stain showed no organisms -Will obtain CBC  -Continue pain control -will obtain EKG (for surgical clearance)  Chronic diastolic heart failure -Echocardiogram February 2016 showed a grade 2 diastolic dysfunction -Currently patient appears to be euvolemic and compensated -Takes metolazone and Lasix at home -Will hold these medications and obtain a BMP -Continue to monitor intake and output, daily weights  Chronic kidney disease, stage IV -Patient follows with Dr. [name] -Will obtain BMP  Diabetes mellitus, type II with neuropathy -Patient uses Lantus 15 units daily at home (had her dose today) -Will place patient on insulin sliding scale was CBG monitoring -Patient states her blood sugars have been in the 200s recently, this may likely be secondary to her infection -Obtain hemoglobin A1c -Continue gabapentin for neuropathy  Essential hypertension -Continue amlodipine, hydralazine, metoprolol  Depression -Continue Zoloft  Tobacco abuse -Smoking cessation discussed -Nicotine patch ordered  Anemia of chronic disease -Hemoglobin on 02/03/2017 was 7.7. Will repeat CBC -Hemoglobin has varied anywhere from 9-12 over the past year -Patient denies any complaints of melena or hematochezia -Recently had anemia panel on 02/02/2017 which showed iron of 18, ferritin of 52 -Continue iron supplementation -Patient receives Aranesp injections every 30 days -Of note, patient has followed up with hem/onc -Will order  FOBT -Continue to monitor CBC  Hepatitis C -Per patient, stable -Continue outpatient follow up  DVT prophylaxis: SCDs  Code Status: Full  Family Communication: None at bedside. Admission, patients condition and plan of care including tests being ordered have been discussed with the  patient, who indicates understanding and agrees with the plan and Code Status.  Disposition Plan: Admitted  Consults called: Orthopedics, Dr. [name] (direct admission)   Admission status: Inpatient   Time spent: 70 minutes  Brandy Houston D.O. [facility] Pager [phone]  If 7PM-7AM, please contact night-coverage [email or web address] Password [facility] 02/05/2017, 5:32 PM

## 2017-02-05 NOTE — Progress Notes (Signed)
Office Visit Note   Patient: Brandy Houston           Date of Birth: 1955-07-19           MRN: [id number] Visit Date: 02/05/2017              Requested by: Jamal Maes, MD [address] [place], [place] [address] PCP: Jamal Maes, MD  No chief complaint on file.     HPI: The patient is a 61 year old woman who presents today status post left total knee arthroplasty swelling, warmth and pain. Has seen Dr. [name] earlier this week who has recommended revision of her left total knee. Synovial fluid cultures have been sent.   Assessment & Plan: Visit Diagnoses:  1. Status post total left knee replacement     Plan: will proceed to admission for hospital admission. Dr [name] to call hospitalist to admit. Plan for revision left TKA tomorrow.   Follow-Up Instructions: No Follow-up on file.   Left Knee Exam   Tenderness  Left knee tenderness location: diffuse.  Other  Erythema: present Swelling: severe  Comments:  Warmth Incision well healed. No drainage.      Patient is alert, oriented, no adenopathy, well-dressed, normal affect, normal respiratory effort.   Imaging: No results found. No images are attached to the encounter.  Labs: Lab Results  Component Value Date   HGBA1C 6.4 (H) 11/08/2015   HGBA1C 7.3 (H) 10/29/2015   HGBA1C 6.3 11/14/2014   ESRSEDRATE 102 (H) 10/29/2015   ESRSEDRATE 120 (H) 08/09/2014   CRP 1.4 (H) 10/29/2015   CRP 1.2 (H) 06/29/2015   CRP 18.3 (H) 08/09/2014   REPTSTATUS 11/12/2015 FINAL 11/07/2015   GRAMSTAIN Moderate 02/03/2017   GRAMSTAIN WBC present-predominately PMN 02/03/2017   GRAMSTAIN No Organisms Seen 02/03/2017   CULT NO GROWTH 5 DAYS 11/07/2015   LABORGA PSEUDOMONAS AERUGINOSA 09/02/2014    Orders:  No orders of the defined types were placed in this encounter.  No orders of the defined types were placed in this encounter.    Procedures: No procedures performed  Clinical Data: No additional findings.  ROS:  All  other systems negative, except as noted in the HPI. Review of Systems  Constitutional: Negative for chills and fever.  Cardiovascular: Positive for leg swelling.  Musculoskeletal: Positive for arthralgias and joint swelling.  Skin: Negative for wound.    Objective: Vital Signs: BP 139/64   Pulse 90   Temp 98.1 F (36.7 C) (Oral)   Resp 18   LMP 03/16/2002   SpO2 94%   Specialty Comments:  No specialty comments available.  PMFS History: Patient Active Problem List   Diagnosis Date Noted  . Dysplasia of cervix, high grade CIN 2 01/12/2017  . Hemarthrosis, left knee 12/29/2016  . Elevated total protein 12/29/2016  . ASCUS with positive high risk HPV cervical 12/09/2016  . Chronic diastolic CHF (congestive heart failure) ([place]) 11/08/2015  . Uncontrolled type 2 diabetes mellitus with complication ([place])   . Status post total left knee replacement   . Acute blood loss anemia   . Diabetic peripheral neuropathy ([place])   . Anxiety state   . Depression   . Normocytic anemia 11/07/2015  . Chronic kidney disease (CKD), stage IV (severe) ([place])   . Postoperative surgical complication involving circulatory system   . Physical debility 09/14/2014  . Acute renal failure syndrome ([place])   . Hepatitis C 08/16/2014  . Septic joint of right hand ([place])   . Septic joint of  right knee joint ([place])   . Septic joint of left shoulder region ([place]) 08/07/2014  . Peripheral neuropathy 06/24/2013  . 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 Uncontrolled hypertension 02/18/2013  . Chest pain at rest 10/18/2011    Class: Acute   Past Medical History:  Diagnosis Date  . Anemia   . Anxiety   . Chronic diastolic CHF (congestive heart failure) ([place])   . Chronic kidney disease    stage IV. previous HD, none currently 10/30/15  . Depression    Chronic  . Diabetes mellitus    type 2, IDDM  . Diabetic peripheral neuropathy ([place])   . Dysrhythmia    tachycardia, normal ECHO 08-09-14  . GERD  (gastroesophageal reflux disease)   . Hepatitis C 2016  . Hepatitis C 08/16/2014  . Hypertension   . Osteoarthritis of left knee   . Peripheral neuropathy   . Protein calorie malnutrition ([place])   . Septic arthritis ([place])   . Slow transit constipation   . Uncontrolled hypertension 02/18/2013  . Unsteady gait     Family History  Problem Relation Age of Onset  . Cancer Mother   . Heart disease Father   . Cancer Sister   . Hypertension Sister   . Cancer Brother   . Diabetes Brother   . Hypertension Brother     Past Surgical History:  Procedure Laterality Date  . APPENDECTOMY    . AV FISTULA PLACEMENT Left 09/13/2014   Procedure: Brachial Artery to Brachial Vein Gortex Four - Seven Stretch GRAFT INSERTION Left Forearm;  Surgeon: Mal Misty, MD;  Location: [place];  Service: Vascular;  Laterality: Left;  . CHOLECYSTECTOMY    . KNEE ARTHROSCOPY Right 08/10/2014   Procedure: ARTHROSCOPY I & D KNEE;  Surgeon: Marianna Payment, MD;  [facility];  Service: Orthopedics;  Laterality: Right;  . KNEE ARTHROSCOPY Left 08/11/2014   Procedure: ARTHROSCOPIC WASHOUT LEFT KNEE;  Surgeon: Marianna Payment, MD;  Location: [place];  Service: Orthopedics;  Laterality: Left;  . KNEE ARTHROSCOPY Left 08/19/2014   Procedure: ARTHROSCOPIC WASHOUT LEFT KNEE;  Surgeon: Leandrew Koyanagi, MD;  Location: [facility];  Service: Orthopedics;  Laterality: Left;  . KNEE ARTHROSCOPY WITH LATERAL MENISECTOMY Left 04/04/2015   Procedure: AND PARTIAL LATERAL MENISECTOMY;  Surgeon: Leandrew Koyanagi, MD;  Location: [place];  Service: Orthopedics;  Laterality: Left;  . KNEE ARTHROSCOPY WITH MEDIAL MENISECTOMY Left 04/04/2015   Procedure: LEFT KNEE ARTHROSCOPY WITH PARTIAL MEDIAL MENISCECTOMY  AND SYNOVECTOMY;  Surgeon: Leandrew Koyanagi, MD;  Location: [place];  Service: Orthopedics;  Laterality: Left;  . SHOULDER ARTHROSCOPY Bilateral 08/10/2014   Procedure: I & D BILATERAL SHOULDERS ;  Surgeon: Marianna Payment, MD;  [facility];  Service: Orthopedics;  Laterality: Bilateral;  . SMALL INTESTINE SURGERY     Due to Small Bowel Obstruction  . TEE WITHOUT CARDIOVERSION N/A 08/14/2014   Procedure: TRANSESOPHAGEAL ECHOCARDIOGRAM (TEE);  Surgeon: Thayer Headings, MD;  Location: [place];  Service: Cardiovascular;  Laterality: N/A;  . TENOSYNOVECTOMY Right 08/11/2014   Procedure: RIGHT WRIST IRRIGATION AND DEBRIDEMENT, TENOSYNOVECTOMY;  Surgeon: Marianna Payment, MD;  Location: [facility] [facility];  Service: Orthopedics;  Laterality: Right;  . TOTAL KNEE ARTHROPLASTY Left 11/07/2015   Procedure: LEFT TOTAL KNEE ARTHROPLASTY WITH REVISION OF IMPLANTS;  Surgeon: Leandrew Koyanagi, MD;  Location: [place];  Service: Orthopedics;  Laterality: Left;  . TUBAL LIGATION     Social History  Occupational History  . Not on file.   Social History Main Topics  . Smoking status: Current Every Day Smoker    Packs/day: 0.50    Years: 20.00    Types: Cigarettes  . Smokeless tobacco: Never Used     Comment: Smoking 7-8 cigs per day  . Alcohol use No     Comment: per pt no alcohol since 09/2014  . Drug use: No  . Sexual activity: No

## 2017-02-06 ENCOUNTER — Inpatient Hospital Stay (HOSPITAL_COMMUNITY): Payer: Medicare Other | Admitting: Anesthesiology

## 2017-02-06 ENCOUNTER — Inpatient Hospital Stay (HOSPITAL_COMMUNITY): Payer: Medicare Other

## 2017-02-06 ENCOUNTER — Encounter (HOSPITAL_COMMUNITY): Payer: Self-pay | Admitting: *Deleted

## 2017-02-06 ENCOUNTER — Encounter (HOSPITAL_COMMUNITY)
Admission: AD | Disposition: A | Discharge status: Skilled Nursing Facility | Payer: Self-pay | Source: Ambulatory Visit | Attending: Family Medicine

## 2017-02-06 DIAGNOSIS — T8454XA Infection and inflammatory reaction due to internal left knee prosthesis, initial encounter: Secondary | ICD-10-CM

## 2017-02-06 DIAGNOSIS — N179 Acute kidney failure, unspecified: Secondary | ICD-10-CM

## 2017-02-06 HISTORY — PX: EXCISIONAL TOTAL KNEE ARTHROPLASTY WITH ANTIBIOTIC SPACERS: SHX5827

## 2017-02-06 LAB — CBC WITH DIFFERENTIAL
Basophils Absolute: 0 10*3/uL (ref 0.0–0.2)
Basos: 0 %
EOS (ABSOLUTE): 0.1 10*3/uL (ref 0.0–0.4)
Eos: 1 %
Hematocrit: 24.9 % — ABNORMAL LOW (ref 34.0–46.6)
Hemoglobin: 7.7 g/dL — ABNORMAL LOW (ref 11.1–15.9)
Immature Grans (Abs): 0 10*3/uL (ref 0.0–0.1)
Immature Granulocytes: 0 %
Lymphocytes Absolute: 1.5 10*3/uL (ref 0.7–3.1)
Lymphs: 12 %
MCH: 28.3 pg (ref 26.6–33.0)
MCHC: 30.9 g/dL — ABNORMAL LOW (ref 31.5–35.7)
MCV: 92 fL (ref 79–97)
Monocytes Absolute: 1.5 10*3/uL — ABNORMAL HIGH (ref 0.1–0.9)
Monocytes: 12 %
Neutrophils Absolute: 9.3 10*3/uL — ABNORMAL HIGH (ref 1.4–7.0)
Neutrophils: 75 %
RBC: 2.72 x10E6/uL — CL (ref 3.77–5.28)
RDW: 15.9 % — ABNORMAL HIGH (ref 12.3–15.4)
WBC: 12.4 10*3/uL — ABNORMAL HIGH (ref 3.4–10.8)

## 2017-02-06 LAB — CBC
HCT: 26.8 % — ABNORMAL LOW (ref 36.0–46.0)
Hemoglobin: 8.8 g/dL — ABNORMAL LOW (ref 12.0–15.0)
MCH: 28 pg (ref 26.0–34.0)
MCHC: 32.8 g/dL (ref 30.0–36.0)
MCV: 85.4 fL (ref 78.0–100.0)
Platelets: 464 10*3/uL — ABNORMAL HIGH (ref 150–400)
RBC: 3.14 MIL/uL — ABNORMAL LOW (ref 3.87–5.11)
RDW: 15 % (ref 11.5–15.5)
WBC: 8.1 10*3/uL (ref 4.0–10.5)

## 2017-02-06 LAB — BASIC METABOLIC PANEL
Anion gap: 13 (ref 5–15)
BUN: 77 mg/dL — ABNORMAL HIGH (ref 6–20)
CO2: 21 mmol/L — ABNORMAL LOW (ref 22–32)
Calcium: 8.8 mg/dL — ABNORMAL LOW (ref 8.9–10.3)
Chloride: 103 mmol/L (ref 101–111)
Creatinine, Ser: 3.66 mg/dL — ABNORMAL HIGH (ref 0.44–1.00)
GFR calc Af Amer: 14 mL/min — ABNORMAL LOW (ref >60)
GFR calc non Af Amer: 12 mL/min — ABNORMAL LOW (ref >60)
Glucose, Bld: 88 mg/dL (ref 65–99)
Potassium: 3.6 mmol/L (ref 3.5–5.1)
Sodium: 137 mmol/L (ref 135–145)

## 2017-02-06 LAB — GLUCOSE, CAPILLARY
Glucose-Capillary: 86 mg/dL (ref 65–99)
Glucose-Capillary: 90 mg/dL (ref 65–99)
Glucose-Capillary: 90 mg/dL (ref 65–99)
Glucose-Capillary: 131 mg/dL — ABNORMAL HIGH (ref 65–99)

## 2017-02-06 LAB — HIV ANTIBODY (ROUTINE TESTING W REFLEX): HIV Screen 4th Generation wRfx: NONREACTIVE

## 2017-02-06 LAB — SURGICAL PCR SCREEN
MRSA, PCR: NEGATIVE
Staphylococcus aureus: NEGATIVE

## 2017-02-06 LAB — PREPARE RBC (CROSSMATCH)

## 2017-02-06 SURGERY — REMOVAL, TOTAL ARTHROPLASTY HARDWARE, KNEE, WITH ANTIBIOTIC SPACER INSERTION
Anesthesia: General | Laterality: Left

## 2017-02-06 MED ORDER — OXYCODONE HCL 5 MG PO TABS
ORAL_TABLET | ORAL | Status: AC
  Filled 2017-02-06: qty 1

## 2017-02-06 MED ORDER — SUGAMMADEX SODIUM 200 MG/2ML IV SOLN
INTRAVENOUS | Status: DC | PRN
  Administered 2017-02-06: 120 mg via INTRAVENOUS

## 2017-02-06 MED ORDER — PROPOFOL 10 MG/ML IV BOLUS
INTRAVENOUS | Status: DC | PRN
  Administered 2017-02-06: 100 mg via INTRAVENOUS

## 2017-02-06 MED ORDER — TRANEXAMIC ACID 1000 MG/10ML IV SOLN
INTRAVENOUS | Status: AC | PRN
  Administered 2017-02-06: 2000 mg via TOPICAL

## 2017-02-06 MED ORDER — MIDAZOLAM HCL 5 MG/5ML IJ SOLN
INTRAMUSCULAR | Status: DC | PRN
  Administered 2017-02-06: 2 mg via INTRAVENOUS

## 2017-02-06 MED ORDER — EPHEDRINE SULFATE 50 MG/ML IJ SOLN
INTRAMUSCULAR | Status: DC | PRN
  Administered 2017-02-06 (×4): 10 mg via INTRAVENOUS

## 2017-02-06 MED ORDER — FENTANYL CITRATE (PF) 100 MCG/2ML IJ SOLN
25.0000 ug | INTRAMUSCULAR | Status: DC | PRN
  Administered 2017-02-06 (×3): 50 ug via INTRAVENOUS

## 2017-02-06 MED ORDER — TRANEXAMIC ACID 1000 MG/10ML IV SOLN
1000.0000 mg | INTRAVENOUS | Status: AC
  Administered 2017-02-06: 1000 mg via INTRAVENOUS
  Filled 2017-02-06: qty 10

## 2017-02-06 MED ORDER — TOBRAMYCIN SULFATE 1.2 G IJ SOLR
INTRAMUSCULAR | Status: AC
  Filled 2017-02-06: qty 4.8

## 2017-02-06 MED ORDER — HYDROMORPHONE HCL 1 MG/ML IJ SOLN
0.5000 mg | INTRAMUSCULAR | Status: AC | PRN
  Administered 2017-02-06 (×2): 0.5 mg via INTRAVENOUS

## 2017-02-06 MED ORDER — EPHEDRINE 5 MG/ML INJ
INTRAVENOUS | Status: AC
  Filled 2017-02-06: qty 10

## 2017-02-06 MED ORDER — SODIUM CHLORIDE 0.9 % IV SOLN: Freq: Once | INTRAVENOUS | Status: DC

## 2017-02-06 MED ORDER — METHOCARBAMOL 500 MG PO TABS
ORAL_TABLET | ORAL | Status: AC
  Filled 2017-02-06: qty 1

## 2017-02-06 MED ORDER — HYDROMORPHONE HCL 1 MG/ML IJ SOLN
INTRAMUSCULAR | Status: AC
  Filled 2017-02-06: qty 1

## 2017-02-06 MED ORDER — FENTANYL CITRATE (PF) 250 MCG/5ML IJ SOLN
INTRAMUSCULAR | Status: AC
  Filled 2017-02-06: qty 5

## 2017-02-06 MED ORDER — SODIUM CHLORIDE 0.9 % IV SOLN
INTRAVENOUS | Status: DC | PRN
  Administered 2017-02-06: 17:00:00 via INTRAVENOUS

## 2017-02-06 MED ORDER — VANCOMYCIN HCL 1000 MG IV SOLR
INTRAVENOUS | Status: DC | PRN
  Administered 2017-02-06: 1000 mg via INTRAVENOUS

## 2017-02-06 MED ORDER — FENTANYL CITRATE (PF) 100 MCG/2ML IJ SOLN
INTRAMUSCULAR | Status: AC
  Filled 2017-02-06: qty 2

## 2017-02-06 MED ORDER — MORPHINE SULFATE (PF) 4 MG/ML IV SOLN
2.0000 mg | INTRAVENOUS | Status: DC | PRN
  Administered 2017-02-07 – 2017-02-16 (×15): 2 mg via INTRAVENOUS
  Filled 2017-02-06 (×15): qty 1

## 2017-02-06 MED ORDER — FUROSEMIDE 10 MG/ML IJ SOLN: 20.0000 mg | Freq: Once | INTRAMUSCULAR | Status: DC

## 2017-02-06 MED ORDER — ROCURONIUM BROMIDE 100 MG/10ML IV SOLN
INTRAVENOUS | Status: DC | PRN
  Administered 2017-02-06: 20 mg via INTRAVENOUS
  Administered 2017-02-06: 50 mg via INTRAVENOUS

## 2017-02-06 MED ORDER — OXYCODONE HCL 5 MG/5ML PO SOLN: 5.0000 mg | Freq: Once | ORAL | Status: AC | PRN

## 2017-02-06 MED ORDER — 0.9 % SODIUM CHLORIDE (POUR BTL) OPTIME
TOPICAL | Status: DC | PRN
  Administered 2017-02-06: 1000 mL

## 2017-02-06 MED ORDER — VANCOMYCIN HCL IN DEXTROSE 1-5 GM/200ML-% IV SOLN
INTRAVENOUS | Status: AC
  Filled 2017-02-06: qty 200

## 2017-02-06 MED ORDER — TRANEXAMIC ACID 1000 MG/10ML IV SOLN
2000.0000 mg | INTRAVENOUS | Status: DC
  Filled 2017-02-06: qty 20

## 2017-02-06 MED ORDER — HYDROMORPHONE HCL 1 MG/ML IJ SOLN
0.5000 mg | INTRAMUSCULAR | Status: DC | PRN
  Administered 2017-02-06: 0.5 mg via INTRAVENOUS

## 2017-02-06 MED ORDER — VANCOMYCIN HCL 1000 MG IV SOLR
INTRAVENOUS | Status: AC
  Filled 2017-02-06: qty 1000

## 2017-02-06 MED ORDER — OXYCODONE HCL 5 MG PO TABS
5.0000 mg | ORAL_TABLET | Freq: Once | ORAL | Status: AC | PRN
  Administered 2017-02-06: 5 mg via ORAL

## 2017-02-06 MED ORDER — ONDANSETRON HCL 4 MG/2ML IJ SOLN
INTRAMUSCULAR | Status: AC
  Filled 2017-02-06: qty 2

## 2017-02-06 MED ORDER — VANCOMYCIN HCL IN DEXTROSE 1-5 GM/200ML-% IV SOLN: 1000.0000 mg | INTRAVENOUS | Status: DC

## 2017-02-06 MED ORDER — ONDANSETRON HCL 4 MG/2ML IJ SOLN
4.0000 mg | Freq: Four times a day (QID) | INTRAMUSCULAR | Status: AC | PRN
  Administered 2017-02-06: 4 mg via INTRAVENOUS

## 2017-02-06 MED ORDER — SODIUM CHLORIDE 0.9 % IV SOLN
INTRAVENOUS | Status: DC
  Administered 2017-02-06: 15:00:00 via INTRAVENOUS

## 2017-02-06 MED ORDER — TOBRAMYCIN SULFATE 1.2 G IJ SOLR
INTRAMUSCULAR | Status: DC | PRN
  Administered 2017-02-06 (×3): 1.2 g

## 2017-02-06 MED ORDER — MIDAZOLAM HCL 2 MG/2ML IJ SOLN
INTRAMUSCULAR | Status: AC
  Filled 2017-02-06: qty 2

## 2017-02-06 MED ORDER — SODIUM CHLORIDE 0.9 % IR SOLN
Status: DC | PRN
  Administered 2017-02-06 (×2): 3000 mL
  Administered 2017-02-06: 250 mL

## 2017-02-06 MED ORDER — LIDOCAINE HCL (CARDIAC) 20 MG/ML IV SOLN
INTRAVENOUS | Status: DC | PRN
  Administered 2017-02-06: 60 mg via INTRAVENOUS

## 2017-02-06 MED ORDER — FENTANYL CITRATE (PF) 100 MCG/2ML IJ SOLN
INTRAMUSCULAR | Status: DC | PRN
  Administered 2017-02-06: 100 ug via INTRAVENOUS
  Administered 2017-02-06: 50 ug via INTRAVENOUS
  Administered 2017-02-06: 100 ug via INTRAVENOUS

## 2017-02-06 MED ORDER — LACTATED RINGERS IV SOLN
INTRAVENOUS | Status: DC | PRN
  Administered 2017-02-06 (×2): via INTRAVENOUS

## 2017-02-06 MED ORDER — VANCOMYCIN HCL 1000 MG IV SOLR
INTRAVENOUS | Status: DC | PRN
  Administered 2017-02-06 (×2): 1000 mg

## 2017-02-06 MED ORDER — SODIUM CHLORIDE 0.9 % IV SOLN
Freq: Once | INTRAVENOUS | Status: AC
  Administered 2017-02-06: 13:00:00 via INTRAVENOUS

## 2017-02-06 SURGICAL SUPPLY — 80 items
ALCOHOL ISOPROPYL (RUBBING) (MISCELLANEOUS) ×2 IMPLANT
BAG DECANTER FOR FLEXI CONT (MISCELLANEOUS) ×2 IMPLANT
BANDAGE ACE 6X5 VEL STRL LF (GAUZE/BANDAGES/DRESSINGS) ×4 IMPLANT
BANDAGE ELASTIC 6 VELCRO ST LF (GAUZE/BANDAGES/DRESSINGS) ×2 IMPLANT
BANDAGE ESMARK 6X9 LF (GAUZE/BANDAGES/DRESSINGS) ×1 IMPLANT
BENZOIN TINCTURE PRP APPL 2/3 (GAUZE/BANDAGES/DRESSINGS) ×2 IMPLANT
BLADE SAW SGTL 13.0X1.19X90.0M (BLADE) ×2 IMPLANT
BNDG ESMARK 6X9 LF (GAUZE/BANDAGES/DRESSINGS) ×2
BONE CEMENT PALACOS R-G (Cement) ×12 IMPLANT
BOWL SMART MIX CTS (DISPOSABLE) ×2 IMPLANT
CEMENT BONE PALACOS R-G (Cement) ×6 IMPLANT
CLSR STERI-STRIP ANTIMIC 1/2X4 (GAUZE/BANDAGES/DRESSINGS) ×4 IMPLANT
CONT SPEC 4OZ CLIKSEAL STRL BL (MISCELLANEOUS) ×4 IMPLANT
COVER SURGICAL LIGHT HANDLE (MISCELLANEOUS) ×2 IMPLANT
CUFF TOURNIQUET SINGLE 34IN LL (TOURNIQUET CUFF) ×2 IMPLANT
CUFF TOURNIQUET SINGLE 44IN (TOURNIQUET CUFF) IMPLANT
DRAPE EXTREMITY T 121X128X90 (DRAPE) ×2 IMPLANT
DRAPE HALF SHEET 40X57 (DRAPES) ×2 IMPLANT
DRAPE INCISE IOBAN 66X45 STRL (DRAPES) IMPLANT
DRAPE ORTHO SPLIT 77X108 STRL (DRAPES) ×2
DRAPE SURG 17X11 SM STRL (DRAPES) ×4 IMPLANT
DRAPE SURG ORHT 6 SPLT 77X108 (DRAPES) ×2 IMPLANT
DRSG AQUACEL AG ADV 3.5X14 (GAUZE/BANDAGES/DRESSINGS) ×2 IMPLANT
DURAPREP 26ML APPLICATOR (WOUND CARE) ×4 IMPLANT
ELECT CAUTERY BLADE 6.4 (BLADE) ×2 IMPLANT
ELECT REM PT RETURN 9FT ADLT (ELECTROSURGICAL) ×2
ELECTRODE REM PT RTRN 9FT ADLT (ELECTROSURGICAL) ×1 IMPLANT
GAUZE SPONGE 4X4 12PLY STRL (GAUZE/BANDAGES/DRESSINGS) ×2 IMPLANT
GAUZE XEROFORM 1X8 LF (GAUZE/BANDAGES/DRESSINGS) ×2 IMPLANT
GLOVE SKINSENSE NS SZ7.5 (GLOVE) ×2
GLOVE SKINSENSE STRL SZ7.5 (GLOVE) ×2 IMPLANT
GLOVE SURG SYN 7.5  E (GLOVE) ×4
GLOVE SURG SYN 7.5 E (GLOVE) ×4 IMPLANT
GOWN STRL REIN XL XLG (GOWN DISPOSABLE) ×2 IMPLANT
GOWN STRL REUS W/ TWL LRG LVL3 (GOWN DISPOSABLE) ×1 IMPLANT
GOWN STRL REUS W/TWL LRG LVL3 (GOWN DISPOSABLE) ×1
HANDPIECE INTERPULSE COAX TIP (DISPOSABLE) ×1
HOOD PEEL AWAY FLYTE STAYCOOL (MISCELLANEOUS) ×4 IMPLANT
IMMOBILIZER KNEE 24 THIGH 36 (MISCELLANEOUS) ×1 IMPLANT
IMMOBILIZER KNEE 24 UNIV (MISCELLANEOUS) ×2
KIT BASIN OR (CUSTOM PROCEDURE TRAY) ×2 IMPLANT
KIT ROOM TURNOVER OR (KITS) ×2 IMPLANT
KIT STIMULAN RAPID CURE  10CC (Orthopedic Implant) ×2 IMPLANT
KIT STIMULAN RAPID CURE 10CC (Orthopedic Implant) ×2 IMPLANT
MANIFOLD NEPTUNE II (INSTRUMENTS) ×2 IMPLANT
MARKER SKIN DUAL TIP RULER LAB (MISCELLANEOUS) ×2 IMPLANT
MOLD SPACER FEM KNEE 53A/PX75M (Spacer) ×1 IMPLANT
MOLD SPACER TIB KNEE 52A/PX81M (Spacer) ×1 IMPLANT
NEEDLE SPNL 18GX3.5 QUINCKE PK (NEEDLE) ×2 IMPLANT
NS IRRIG 1000ML POUR BTL (IV SOLUTION) ×2 IMPLANT
PACK TOTAL JOINT (CUSTOM PROCEDURE TRAY) ×2 IMPLANT
PAD ABD 8X10 STRL (GAUZE/BANDAGES/DRESSINGS) ×2 IMPLANT
PAD ARMBOARD 7.5X6 YLW CONV (MISCELLANEOUS) ×4 IMPLANT
PADDING CAST COTTON 6X4 STRL (CAST SUPPLIES) ×2 IMPLANT
SAW OSC TIP CART 19.5X105X1.3 (SAW) ×2 IMPLANT
SEALER BIPOLAR AQUA 6.0 (INSTRUMENTS) ×2 IMPLANT
SET HNDPC FAN SPRY TIP SCT (DISPOSABLE) ×1 IMPLANT
SPACER KASM MOLD44APX70ML KNEE (Spacer) ×2 IMPLANT
SPACERMOLD FEM KNEE 53A/PX75M (Spacer) ×2 IMPLANT
SPACERMOLD TIB KNEE 52A/PX81M (Spacer) ×2 IMPLANT
SPONGE LAP 18X18 X RAY DECT (DISPOSABLE) ×2 IMPLANT
STAPLER VISISTAT 35W (STAPLE) IMPLANT
SUCTION FRAZIER HANDLE 10FR (MISCELLANEOUS) ×1
SUCTION TUBE FRAZIER 10FR DISP (MISCELLANEOUS) ×1 IMPLANT
SUT ETHILON 2 0 FS 18 (SUTURE) ×2 IMPLANT
SUT MNCRL AB 4-0 PS2 18 (SUTURE) IMPLANT
SUT MON AB 2-0 CT1 36 (SUTURE) ×4 IMPLANT
SUT PDS AB 1 CTX 36 (SUTURE) ×4 IMPLANT
SUT VIC AB 0 CT1 27 (SUTURE) ×2
SUT VIC AB 0 CT1 27XBRD ANBCTR (SUTURE) ×2 IMPLANT
SUT VIC AB 1 CTX 27 (SUTURE) ×6 IMPLANT
SUT VIC AB 2-0 CT1 27 (SUTURE) ×3
SUT VIC AB 2-0 CT1 TAPERPNT 27 (SUTURE) ×3 IMPLANT
SYR 50ML LL SCALE MARK (SYRINGE) ×2 IMPLANT
TIP HIGH FLOW IRRIGATION COAX (MISCELLANEOUS) ×2 IMPLANT
TOWEL OR 17X24 6PK STRL BLUE (TOWEL DISPOSABLE) ×2 IMPLANT
TOWEL OR 17X26 10 PK STRL BLUE (TOWEL DISPOSABLE) ×2 IMPLANT
TRAY CATH 16FR W/PLASTIC CATH (SET/KITS/TRAYS/PACK) ×2 IMPLANT
UNDERPAD 30X30 (UNDERPADS AND DIAPERS) ×2 IMPLANT
WRAP KNEE MAXI GEL POST OP (GAUZE/BANDAGES/DRESSINGS) IMPLANT

## 2017-02-06 NOTE — Plan of Care (Signed)
Evaluated patient post surgery. Vital signs stable. Patient drowsy but easily arousable and able to answer questions appropriately. No pain at the time of evaluation  Vital Signs: Temp 97.9, Pulse 77, BP 121/63, oxygen sat 100% on  2L [place]  Contact E-link, no need for CCM consult at this time. Will continue to monitor and address any changes in status

## 2017-02-06 NOTE — Transfer of Care (Signed)
Immediate Anesthesia Transfer of Care Note  Patient: Brandy Houston  Procedure(s) Performed: Procedure(s): Incisional total iknee with antibiotic spacer  (Left)  Patient Location: PACU  Anesthesia Type:General  Level of Consciousness: awake, alert  and patient cooperative  Airway & Oxygen Therapy: Patient Spontanous Breathing  Post-op Assessment: Report given to RN and Post -op Vital signs reviewed and stable  Post vital signs: Reviewed and stable  Last Vitals:  Vitals:   02/06/17 1305 02/06/17 1527  BP: 132/61 (!) 154/55  Pulse: 82 73  Resp: 16 16  Temp: 37.1 C 37 C  SpO2: 98% 98%    Last Pain:  Vitals:   02/06/17 1527  TempSrc: Oral  PainSc:       Patients Stated Pain Goal: 3 ([id number])  Complications: No apparent anesthesia complications

## 2017-02-06 NOTE — Progress Notes (Signed)
Nutrition Brief Note  Patient identified on the Malnutrition Screening Tool (MST) Report  Wt Readings from Last 15 Encounters:  02/05/17 148 lb 3.2 oz (67.2 kg)  01/19/17 143 lb 8 oz (65.1 kg)  01/12/17 150 lb (68 kg)  01/05/17 149 lb 6.4 oz (67.8 kg)  12/29/16 146 lb 4.8 oz (66.4 kg)  12/09/16 154 lb 3.2 oz (69.9 kg)  01/24/16 156 lb (70.8 kg)  12/10/15 160 lb (72.6 kg)  11/29/15 170 lb 3.2 oz (77.2 kg)  11/16/15 170 lb 3.2 oz (77.2 kg)  11/14/15 170 lb 3.2 oz (77.2 kg)  11/13/15 170 lb 3.1 oz (77.2 kg)  10/29/15 158 lb 12.8 oz (72 kg)  07/27/15 176 lb (79.8 kg)  06/27/15 203 lb 3.2 oz (92.2 kg)   SHAVANA CALDER is a 61 y.o. female with a medical history of diastolic heart failure, chronic kidney disease, diabetes, hypertension, septic arthritis, presented to the hospital from Dr. [name] office (ortho). Patient presented to his office on 02/03/2017 for left knee pain, status post aspiration.  Spoke with pt, who reports good appetite PTA. Pt consumes 3 meals daily (Breakfast: oatmeal and toast, lunch: fruit and yogurt, dinner: meat, starch, and vegetable). Pt denies any changes in her eating habits. Per her reports UBW: 150#. Per wt hx, pt has experienced a 22# (12.9%) wt loss over the past year; pt shares that this was due to fluid losses after initiation of lasix.   Pt eager to eat, stating she's hungry, however, is understanding for rationale of NPO order. She denies any further nutrition-related concerns, however, expressed appreciation for visit.   Nutrition-Focused physical exam completed. Findings are no fat depletion, no muscle depletion, and mild edema.   Pt with excellent DM control; noted Hgb A1c: 6.2 (02/05/17). PTA DM medications are 15 units insulin glargine q AM. Current orders for DM control as 0-9 units insulin aspart TID with meals and 15 units insulin glargine daily before breakfast.  Labs reviewed: CBGS: 86-124.  Body mass index is 23.21 kg/m. Patient meets criteria for  normal weight range based on current BMI.   Current diet order is NPO, patient is consuming approximately n/a% of meals at this time. Labs and medications reviewed.   No nutrition interventions warranted at this time. If nutrition issues arise, please consult RD.   Braylan Faul A. Jimmye Norman, RD, LDN, CDE Pager: [phone] After hours Pager: [phone]

## 2017-02-06 NOTE — Anesthesia Preprocedure Evaluation (Signed)
Anesthesia Evaluation  Patient identified by MRN, date of birth, ID band Patient awake    Reviewed: Allergy & Precautions, H&P , NPO status , Patient's Chart, lab work & pertinent test results  Airway Mallampati: II   Neck ROM: full    Dental   Pulmonary Current Smoker,    breath sounds clear to auscultation       Cardiovascular hypertension, +CHF   Rhythm:regular Rate:Normal     Neuro/Psych PSYCHIATRIC DISORDERS Anxiety Depression  Neuromuscular disease    GI/Hepatic GERD  ,(+) Hepatitis -, C  Endo/Other  diabetes, Type 2  Renal/GU Renal InsufficiencyRenal disease     Musculoskeletal  (+) Arthritis ,   Abdominal   Peds  Hematology  (+) anemia ,   Anesthesia Other Findings   Reproductive/Obstetrics                             Anesthesia Physical Anesthesia Plan  ASA: III  Anesthesia Plan: General   Post-op Pain Management:    Induction: Intravenous  PONV Risk Score and Plan: 2 and Ondansetron, Dexamethasone and Treatment may vary due to age or medical condition  Airway Management Planned: LMA  Additional Equipment:   Intra-op Plan:   Post-operative Plan:   Informed Consent: I have reviewed the patients History and Physical, chart, labs and discussed the procedure including the risks, benefits and alternatives for the proposed anesthesia with the patient or authorized representative who has indicated his/her understanding and acceptance.     Plan Discussed with: CRNA, Anesthesiologist and Surgeon  Anesthesia Plan Comments:         Anesthesia Quick Evaluation

## 2017-02-06 NOTE — Progress Notes (Signed)
Pharmacy Antibiotic Note  Brandy Houston is a 61 y.o. female admitted on 02/05/2017 with septic left knee joint s/p revision today with placement of antibiotic spacer. Pharmacy has been consulted for vancomycin dosing. She received 1g of vancomycin at 1623. Has CKD stage IV with Cr 3.66 and CrCl 15 ml/min.  Vancomycin trough 15-20  Plan: 1) Vancomycin 1g IV q48 - first dose 8/26 @1600  2) Follow renal function, cultures, LOT, level as needed  Height: 5\' 7"  (170.2 cm) Weight: 148 lb (67.1 kg) IBW/kg (Calculated) : 61.6  Temp (24hrs), Avg:98.7 F (37.1 C), Min:97.5 F (36.4 C), Max:99.5 F (37.5 C)   Recent Labs Lab 02/03/17 1635 02/05/17 1844 02/06/17 0815  WBC 12.4* 10.3 8.1  CREATININE  --  3.87* 3.66*    Estimated Creatinine Clearance: 15.5 mL/min (A) (by C-G formula based on SCr of 3.66 mg/dL (H)).    Allergies  Allergen Reactions  . Compazine [Prochlorperazine] Shortness Of Breath and Swelling    TONGUE SWELLS  . Shellfish-Derived Products Anaphylaxis  . Iodinated Diagnostic Agents Hives and Rash  . Omnipaque [Iohexol] Hives    Antimicrobials this admission: 8/24 Vancomycin >>  Dose adjustments this admission: n/a  Microbiology results: 8/21 left knee aspirate >> negative 8/24 left knee tissue >>  Thank you for allowing pharmacy to be a part of this patient's care.  Deboraha Sprang 02/06/2017 10:11 PM

## 2017-02-06 NOTE — H&P (Signed)

## 2017-02-06 NOTE — Anesthesia Procedure Notes (Signed)
Procedure Name: Intubation Date/Time: 02/06/2017 3:46 PM Performed by: Manus Gunning, Sir[place] Outland J Pre-anesthesia Checklist: Patient identified, Emergency Drugs available, Suction available, Patient being monitored and Timeout performed Patient Re-evaluated:Patient Re-evaluated prior to induction Oxygen Delivery Method: Circle system utilized Preoxygenation: Pre-oxygenation with 100% oxygen Induction Type: IV induction Ventilation: Mask ventilation without difficulty Laryngoscope Size: Mac and 3 Grade View: Grade I Tube type: Oral Tube size: 7.0 mm Number of attempts: 1 Airway Equipment and Method: Stylet Placement Confirmation: ETT inserted through vocal cords under direct vision,  positive ETCO2 and breath sounds checked- equal and bilateral Secured at: 21 cm Tube secured with: Tape Dental Injury: Teeth and Oropharynx as per pre-operative assessment

## 2017-02-06 NOTE — Progress Notes (Signed)
Spoke with Dr. Ree Kida and received a telephone order to give Lasix 20mg  (which is ordered to give at 1315) at completion of the 2nd unit.   Brandy Houston n

## 2017-02-06 NOTE — Op Note (Signed)
   Date of Surgery: 02/06/2017  INDICATIONS: Ms. Gangl is a 61 y.o.-year-old female with a prosthetic left total knee replacement;  The patient did consent to the procedure after discussion of the risks and benefits.  PREOPERATIVE DIAGNOSIS: Left anesthetic total knee replacement  POSTOPERATIVE DIAGNOSIS: Same.  PROCEDURE:  1. Left total knee excision arthroplasty and placement of articulating methylmethacrylate antibiotic spacer 2. Debridement of bone, muscle and skin 15 x 12 cm 3. Complex wound repair left knee 15 cm  SURGEON: N. Eduard Roux, M.D.  ASSIST: April Green, RNFA.  ANESTHESIA:  general  IV FLUIDS AND URINE: See anesthesia.  ESTIMATED BLOOD LOSS: 800 mL.  IMPLANTS: Articulating antibiotic spacer  DRAINS: None  COMPLICATIONS: None.  DESCRIPTION OF PROCEDURE: The patient was brought to the operating room and placed supine on the operating table.  The patient had been signed prior to the procedure and this was documented. The patient had the anesthesia placed by the anesthesiologist.  A time-out was performed to confirm that this was the correct patient, site, side and location. The patient did receive antibiotics once intraoperative cultures were taken and was re-dosed during the procedure as needed at indicated intervals.  A tourniquet was placed.  The patient had the operative extremity prepped and draped in the standard surgical fashion.    I first ellipsed out the previous surgical scar and a full-thickness fashion. Tissue planes were then developed both medially and laterally with cautery. Dissection was then carried down to the joint capsule. A medial parapatellar arthrotomy was then performed. There was a large amount of bloody effusion that was expressed. I then took cultures of the synovium. Soft tissue releases were then performed. Retractors were placed for adequate visualization. The polyethylene liner was removed. The femoral component was grossly loose. This was  then gently tamped out with a bone tamp. We then performed sharp excisional debridement of the femoral canal bone and synovium with a Roger and curet. Once this was done we then turned our attention to removing the tibial component. Osteotomes were used to gently break up the interface between the implant and the cement. The tibial tray and the stem were then removed gently without any bone loss. The cement was then removed from the bony surface. Sharp excisional debridement of the bone and soft tissue were then performed with a Roger and curet. Tissue samples from the femoral and tibial canal were sent for culture. After thorough debridement the wound was then irrigated with pulsed lavage. Hemostasis was then obtained. We then molded and articulating methylmethacrylate antibiotic spacer impregnated with tobramycin. I placed 25 mL of vancomycin impregnated antibiotic beads down both the femoral and tibial canal. The articulating spacer was then placed in the knee joint. The cement was then allowed to harden. Patellar tracking was appropriate. The wound was then thoroughly lavaged again. The arthrotomy was closed with interrupted #1 PDS. Subcutaneous layer was closed with 2-0 Monocryl. Skin was closed with 2-0 nylon. Sterile dressings were applied. Patient tolerated procedure well. Knee immobilizer was placed. She was transferred to the PACU in stable condition.  POSTOPERATIVE PLAN: Patient will be admitted to the ICU overnight for observation. She will be touchdown weightbearing.  Azucena Cecil, MD [place] 6:31 PM

## 2017-02-06 NOTE — Progress Notes (Addendum)
PROGRESS NOTE    Brandy Houston  LZJ:[id number] DOB: Oct 09, 1955 DOA: 02/05/2017 PCP: [name], MD   No chief complaint on file.  Brief Narrative:  HPI On 02/05/2017 Brandy Houston is a 61 y.o. female with a medical history of diastolic heart failure, chronic kidney disease, diabetes, hypertension, septic arthritis, presented to the hospital from Dr. [name] office (ortho). Patient presented to his office on 02/03/2017 for left knee pain, status post aspiration. Fluid showed greater than 58,000 WBC. Patient states over the past several months, she's been having problems walking and her knee has been popping out of place. Currently patient denies any chest pain, shortness breath, abdominal pain, nausea vomiting, diarrhea or constipation, dizziness or headache. She denies any recent trauma or falls. Denies any recent travel or ill contacts. Patient has been seeing a hematologist for hemarthrosis and has had several bloody knee effusions. Per documentation, unlikely to be coagulopathy. Patient has not been on any antibiotics recently.  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 [name] -Patient was directly admitted from his office today. -Plan for total knee revision today 02/06/2017 -s/p aspiration yielding 120 mL's of bloody fluid (on 02/03/2017) -Fluid shows 58,000 WBC. Gram stain showed no organisms -Will obtain CBC  -Continue pain control -discussed with Dr. [name], no antibiotics for now  Chronic diastolic heart failure -Echocardiogram February 2016 showed a grade 2 diastolic dysfunction -Currently patient appears to be euvolemic and compensated -Takes metolazone and Lasix at home -Patient with acute kidney injury, will hold diuretics -Monitor intake and output, daily weights  Acute kidney injury vs Chronic kidney disease, stage IV -Patient follows with Dr. [name] -Creatinine 3.66 (baseline  2.3-2.5) -Possibly due to anemia -Will continue to transfuse and monitor BMP  -May start IVF after surgery  (however, patient does have a history of CHF)  Diabetes mellitus, type II with neuropathy -Patient uses Lantus 15 units daily at home (had her dose today) -Will place patient on insulin sliding scale was CBG monitoring -Patient states her blood sugars have been in the 200s recently, this may likely be secondary to her infection -Obtain hemoglobin A1c -Continue gabapentin for neuropathy  Essential hypertension -Continue amlodipine, hydralazine, metoprolol  Depression -Continue Zoloft  Tobacco abuse -Smoking cessation discussed -Nicotine patch ordered  Anemia of chronic disease/ hemarthrosis -Hemoglobin was 7.1 on admission (baseline hemoglobin 92 over the past year) -Has had a history of hemarthrosis and was seeing oncology. Patient was receiving Aranesp injections every 30 days. -Patient was transfused 2 units PRBC on 02/05/2017, will order additional 2 units prior to surgery, and patient will likely need an additional 2 units post. -Patient denies any complaints of melena or hematochezia -Recently had anemia panel on 02/02/2017 which showed iron of 18, ferritin of 52 -Continue to monitor CBC  Hepatitis C -Per patient, stable -Continue outpatient follow up  DVT Prophylaxis  SCDs  Code Status: Full  Family Communication: None at bedside  Disposition Plan: Admitted. Pending surgery today.  Consultants Orthopedic surgery, Dr. [name]  Procedures  None  Antibiotics   Anti-infectives    None      Subjective:   Brandy Houston seen and examined today.   Continues to complain of sharp knee pain. Denies chest pain, shortness of breath, abdominal pain, nausea, vomiting, diarrhea, constipation.   Objective:   Vitals:   02/06/17 0215 02/06/17 0430 02/06/17 1250 02/06/17 1305  BP: 132/60 135/62 125/78  132/61  Pulse: 70 77 67 82  Resp: 18 18 16 16   Temp: 99.3 F  (37.4 C) 98.4 F (36.9 C) 98.8 F (37.1 C) 98.8 F (37.1 C)  TempSrc: Oral Oral Oral Oral  SpO2: 98% 99% 99% 98%  Weight:      Height:        Intake/Output Summary (Last 24 hours) at 02/06/17 1309 Last data filed at 02/06/17 0600  Gross per 24 hour  Intake              680 ml  Output             1600 ml  Net             -920 ml   Filed Weights   02/05/17 1643  Weight: 67.2 kg (148 lb 3.2 oz)    Exam  General: Well developed, well nourished, NAD, appears stated age  HEENT: NCAT, mucous membranes moist.   Cardiovascular: S1 S2 auscultated, 2/6 SEM, RRR  Respiratory: Clear to auscultation bilaterally with equal chest rise  Abdomen: Soft, nontender, nondistended, + bowel sounds  Extremities: warm dry without cyanosis clubbing, edema of the RLE. Left knee with edema, TTP, well-healed scars.  Neuro: AAOx3, nonfocal  Psych: appropriate mood and affect, pleasant   Data Reviewed: I have personally reviewed following labs and imaging studies  CBC:  Recent Labs Lab 02/02/17 1218 02/03/17 1635 02/05/17 1844 02/06/17 0815  WBC  --  12.4* 10.3 8.1  NEUTROABS  --  9.3* 7.7  --   HGB 8.1* 7.7* 7.1* 8.8*  HCT  --  24.9* 22.1* 26.8*  MCV  --  92 84.7 85.4  PLT  --   --  521* 527*   Basic Metabolic Panel:  Recent Labs Lab 02/05/17 1844 02/06/17 0815  NA 134* 137  K 3.4* 3.6  CL 101 103  CO2 21* 21*  GLUCOSE 180* 88  BUN 79* 77*  CREATININE 3.87* 3.66*  CALCIUM 8.7* 8.8*  MG 2.1  --    GFR: Estimated Creatinine Clearance: 15.5 mL/min (A) (by C-G formula based on SCr of 3.66 mg/dL (H)). Liver Function Tests:  Recent Labs Lab 02/05/17 1844  AST 16  ALT 20  ALKPHOS 117  BILITOT 0.3  PROT 7.8  ALBUMIN 2.6*   No results for input(s): LIPASE, AMYLASE in the last 168 hours. No results for input(s): AMMONIA in the last 168 hours. Coagulation Profile: No results for input(s): INR, PROTIME in the last 168 hours. Cardiac Enzymes: No results for input(s):  CKTOTAL, CKMB, CKMBINDEX, TROPONINI in the last 168 hours. BNP (last 3 results) No results for input(s): PROBNP in the last 8760 hours. HbA1C:  Recent Labs  02/05/17 1844  HGBA1C 6.2*   CBG:  Recent Labs Lab 02/05/17 1732 02/05/17 2149 02/06/17 0801 02/06/17 1246  GLUCAP 206* 124* 86 90   Lipid Profile: No results for input(s): CHOL, HDL, LDLCALC, TRIG, CHOLHDL, LDLDIRECT in the last 72 hours. Thyroid Function Tests: No results for input(s): TSH, T4TOTAL, FREET4, T3FREE, THYROIDAB in the last 72 hours. Anemia Panel: No results for input(s): VITAMINB12, FOLATE, FERRITIN, TIBC, IRON, RETICCTPCT in the last 72 hours. Urine analysis:    Component Value Date/Time   COLORURINE YELLOW 10/29/2015 [place] 10/29/2015 1425   LABSPEC 1.017 10/29/2015 1425   PHURINE 5.5 10/29/2015 1425   GLUCOSEU NEGATIVE 10/29/2015 1425   [place] 10/29/2015 [place] 10/29/2015 [place] 10/29/2015 1425  PROTEINUR 100 (A) 10/29/2015 1425   UROBILINOGEN 0.2 09/02/2014 1504   NITRITE NEGATIVE 10/29/2015 1425   LEUKOCYTESUR SMALL (A) 10/29/2015 1425   Sepsis Labs: @LABRCNTIP (procalcitonin:4,lacticidven:4)  ) Recent Results (from the past 240 hour(s))  Gram stain     Status: None   Collection Time: 02/03/17  4:34 PM  Result Value Ref Range Status   Gram Stain Moderate  Final   Gram Stain WBC present-predominately PMN  Final   Gram Stain No Organisms Seen  Final  Surgical pcr screen     Status: None   Collection Time: 02/05/17  9:20 PM  Result Value Ref Range Status   MRSA, PCR NEGATIVE NEGATIVE Final   Staphylococcus aureus NEGATIVE NEGATIVE Final    Comment:        The Xpert SA Assay (FDA approved for NASAL specimens in patients over 30 years of age), is one component of a comprehensive surveillance program.  Test performance has been validated by [facility] for patients greater than or equal to 74 year old. It is not  intended to diagnose infection nor to guide or monitor treatment.       Radiology Studies: No results found.   Scheduled Meds: . amLODipine  10 mg Oral QHS  . calcitRIOL  0.25 mcg Oral Q M,W,F  . ferrous sulfate  325 mg Oral BID WC  . furosemide  20 mg Intravenous Once  . gabapentin  100 mg Oral TID  . hydrALAZINE  100 mg Oral BID  . insulin aspart  0-9 Units Subcutaneous TID WC  . [START ON 02/07/2017] insulin glargine  15 Units Subcutaneous QAC breakfast  . methocarbamol  500 mg Oral BID  . metoprolol succinate  50 mg Oral BID  . multivitamin with minerals  1 tablet Oral Daily  . nicotine  14 mg Transdermal Daily  . pantoprazole  40 mg Oral Daily  . senna-docusate  2 tablet Oral QHS  . sertraline  100 mg Oral Daily  . sodium chloride flush  3 mL Intravenous Q12H   Continuous Infusions: . sodium chloride    . sodium chloride    . sodium chloride       LOS: 1 day   Time Spent in minutes   35 minutes  Orvilla Truett D.O. on 02/06/2017 at 1:09 PM  Between 7am to 7pm - Pager - [phone]  After 7pm go to [email or web address] - password [email or web address]  And look for the night coverage person covering for me after hours  [facility] Office  [phone]

## 2017-02-07 ENCOUNTER — Inpatient Hospital Stay (HOSPITAL_COMMUNITY): Payer: Medicare Other

## 2017-02-07 DIAGNOSIS — E118 Type 2 diabetes mellitus with unspecified complications: Secondary | ICD-10-CM

## 2017-02-07 DIAGNOSIS — Z96652 Presence of left artificial knee joint: Secondary | ICD-10-CM

## 2017-02-07 DIAGNOSIS — E1122 Type 2 diabetes mellitus with diabetic chronic kidney disease: Secondary | ICD-10-CM

## 2017-02-07 DIAGNOSIS — F17210 Nicotine dependence, cigarettes, uncomplicated: Secondary | ICD-10-CM

## 2017-02-07 DIAGNOSIS — Y838 Other surgical procedures as the cause of abnormal reaction of the patient, or of later complication, without mention of misadventure at the time of the procedure: Secondary | ICD-10-CM

## 2017-02-07 DIAGNOSIS — N179 Acute kidney failure, unspecified: Secondary | ICD-10-CM

## 2017-02-07 DIAGNOSIS — D62 Acute posthemorrhagic anemia: Secondary | ICD-10-CM

## 2017-02-07 DIAGNOSIS — M2500 Hemarthrosis, unspecified joint: Secondary | ICD-10-CM

## 2017-02-07 DIAGNOSIS — T8454XA Infection and inflammatory reaction due to internal left knee prosthesis, initial encounter: Secondary | ICD-10-CM

## 2017-02-07 DIAGNOSIS — D72829 Elevated white blood cell count, unspecified: Secondary | ICD-10-CM | POA: Insufficient documentation

## 2017-02-07 DIAGNOSIS — I130 Hypertensive heart and chronic kidney disease with heart failure and stage 1 through stage 4 chronic kidney disease, or unspecified chronic kidney disease: Secondary | ICD-10-CM

## 2017-02-07 LAB — GLUCOSE, CAPILLARY
Glucose-Capillary: 118 mg/dL — ABNORMAL HIGH (ref 65–99)
Glucose-Capillary: 147 mg/dL — ABNORMAL HIGH (ref 65–99)
Glucose-Capillary: 130 mg/dL — ABNORMAL HIGH (ref 65–99)
Glucose-Capillary: 147 mg/dL — ABNORMAL HIGH (ref 65–99)
Glucose-Capillary: 98 mg/dL (ref 65–99)

## 2017-02-07 LAB — CBC
HCT: 28.9 % — ABNORMAL LOW (ref 36.0–46.0)
Hemoglobin: 9.5 g/dL — ABNORMAL LOW (ref 12.0–15.0)
MCH: 27.8 pg (ref 26.0–34.0)
MCHC: 32.9 g/dL (ref 30.0–36.0)
MCV: 84.5 fL (ref 78.0–100.0)
Platelets: 391 10*3/uL (ref 150–400)
RBC: 3.42 MIL/uL — ABNORMAL LOW (ref 3.87–5.11)
RDW: 15.3 % (ref 11.5–15.5)
WBC: 15 10*3/uL — ABNORMAL HIGH (ref 4.0–10.5)

## 2017-02-07 LAB — BASIC METABOLIC PANEL
Anion gap: 11 (ref 5–15)
BUN: 72 mg/dL — ABNORMAL HIGH (ref 6–20)
CO2: 21 mmol/L — ABNORMAL LOW (ref 22–32)
Calcium: 8 mg/dL — ABNORMAL LOW (ref 8.9–10.3)
Chloride: 106 mmol/L (ref 101–111)
Creatinine, Ser: 3.67 mg/dL — ABNORMAL HIGH (ref 0.44–1.00)
GFR calc Af Amer: 14 mL/min — ABNORMAL LOW (ref >60)
GFR calc non Af Amer: 12 mL/min — ABNORMAL LOW (ref >60)
Glucose, Bld: 167 mg/dL — ABNORMAL HIGH (ref 65–99)
Potassium: 4.4 mmol/L (ref 3.5–5.1)
Sodium: 138 mmol/L (ref 135–145)

## 2017-02-07 MED ORDER — ORAL CARE MOUTH RINSE
15.0000 mL | Freq: Two times a day (BID) | OROMUCOSAL | Status: DC
  Administered 2017-02-07 – 2017-02-17 (×13): 15 mL via OROMUCOSAL

## 2017-02-07 MED ORDER — HYDROMORPHONE HCL 1 MG/ML IJ SOLN
0.5000 mg | Freq: Once | INTRAMUSCULAR | Status: AC
  Administered 2017-02-07: 0.5 mg via INTRAVENOUS
  Filled 2017-02-07: qty 0.5

## 2017-02-07 MED ORDER — SODIUM CHLORIDE 0.9 % IV SOLN
420.0000 mg | INTRAVENOUS | Status: DC
  Administered 2017-02-08: 420 mg via INTRAVENOUS
  Filled 2017-02-07: qty 8.4

## 2017-02-07 MED ORDER — HYDRALAZINE HCL 25 MG PO TABS
25.0000 mg | ORAL_TABLET | Freq: Two times a day (BID) | ORAL | Status: DC
  Administered 2017-02-08 – 2017-02-12 (×9): 25 mg via ORAL
  Filled 2017-02-07 (×10): qty 1

## 2017-02-07 MED ORDER — OXYCODONE HCL 5 MG PO TABS
5.0000 mg | ORAL_TABLET | ORAL | Status: DC | PRN
  Administered 2017-02-07 – 2017-02-18 (×40): 10 mg via ORAL
  Filled 2017-02-07 (×41): qty 2

## 2017-02-07 MED ORDER — ENOXAPARIN SODIUM 30 MG/0.3ML ~~LOC~~ SOLN
30.0000 mg | Freq: Every day | SUBCUTANEOUS | Status: DC
  Administered 2017-02-07 – 2017-02-18 (×12): 30 mg via SUBCUTANEOUS
  Filled 2017-02-07 (×12): qty 0.3

## 2017-02-07 MED ORDER — PROMETHAZINE HCL 25 MG/ML IJ SOLN
12.5000 mg | Freq: Four times a day (QID) | INTRAMUSCULAR | Status: DC | PRN
  Administered 2017-02-07 (×3): 12.5 mg via INTRAVENOUS
  Filled 2017-02-07 (×3): qty 1

## 2017-02-07 MED ORDER — SODIUM CHLORIDE 0.9 % IV SOLN
INTRAVENOUS | Status: DC
Start: 2017-02-07 — End: 2017-02-10
  Administered 2017-02-07 – 2017-02-09 (×4): via INTRAVENOUS

## 2017-02-07 NOTE — Progress Notes (Signed)
   Subjective:  Patient reports pain as severe.  Stable medically  Objective:   VITALS:   Vitals:   02/07/17 0734 02/07/17 0800 02/07/17 0900 02/07/17 1000  BP:  118/66 122/60 115/72  Pulse:  76 79 81  Resp:  13 14 13   Temp: 98.5 F (36.9 C)     TempSrc: Oral     SpO2:  98% 93% 95%  Weight:      Height:        Neurologically intact Neurovascular intact Sensation intact distally Intact pulses distally Dorsiflexion/Plantar flexion intact Incision: dressing C/D/I and no drainage No cellulitis present Compartment soft   Lab Results  Component Value Date   WBC 15.0 (H) 02/07/2017   HGB 9.5 (L) 02/07/2017   HCT 28.9 (L) 02/07/2017   MCV 84.5 02/07/2017   PLT 391 02/07/2017     Assessment/Plan:  1 Day Post-Op   - Expected postop acute blood loss anemia - will monitor for symptoms - Up with PT/OT - DVT ppx - SCDs, ambulation, renally dosed lovenox - TDWB operative extremity in KI - Pain control - appreciate hospitalist assistance - I will consult ID - follow cultures  Eduard Roux 02/07/2017, 10:46 AM [phone]

## 2017-02-07 NOTE — Consult Note (Addendum)
[facility]    Date of Admission:  02/05/2017           Day 2 vancomycin       Reason for Consult: Left prosthetic knee infection    Referring Provider: Dr. 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 The hardware has now been removed. Staph species are the most likely culprits. Because of her borderline kidney function and history of needing dialysis in 2016 I will change vancomycin to daptomycin.  Plan: 1. Start daptomycin pending culture results   Active Problems:   Infection of prosthetic left knee joint ([name])   Peripheral neuropathy   Hepatitis C   Chronic kidney disease (CKD), stage IV (severe) ([name])   Chronic diastolic CHF (congestive heart failure) ([name])   Diabetic peripheral neuropathy ([name])   Depression   Anemia of chronic disease   Diabetes mellitus, type II ([name])   AKI (acute kidney injury) ([name])   Hemarthrosis   . calcitRIOL  0.25 mcg Oral Q M,W,F  . enoxaparin (LOVENOX) injection  30 mg Subcutaneous Daily  . ferrous sulfate  325 mg Oral BID WC  . furosemide  20 mg Intravenous Once  . gabapentin  100 mg Oral TID  . hydrALAZINE  100 mg Oral BID  . insulin aspart  0-9 Units Subcutaneous TID WC  . insulin glargine  15 Units Subcutaneous QAC breakfast  . mouth rinse  15 mL Mouth Rinse BID  . methocarbamol  500 mg Oral BID  . metoprolol succinate  50 mg Oral BID  . multivitamin with minerals  1 tablet Oral Daily  . nicotine  14 mg Transdermal Daily  . pantoprazole  40 mg Oral Daily  . senna-docusate  2 tablet Oral QHS  . sertraline  100 mg Oral Daily  . sodium chloride flush  3 mL Intravenous Q12H    HPI: Brandy Houston is a 61 y.o. female with history of MSSA bacteremia and 2409 complicated by multiple septic joints including both knees. She underwent surgery and 8 weeks of IV antibiotic therapy before cure. She was left with significant arthritis and pain in her left knee and  underwent left total knee arthroplasty in May 2017. She has been having increasing pain and swelling of her left knee the past several months. She recently had aspiration and the white blood cell count was 58,000. No organisms were seen on stain. I cannot locate the results of the cultures from 11/03/2016 yet. Yesterday, she underwent resection arthroplasty and spacer placement. Operative Gram stain showed no organisms and cultures are negative at 24 hours.   Review of Systems: Review of Systems  Constitutional: Negative for chills, diaphoresis and fever.  Gastrointestinal: Negative for abdominal pain, diarrhea, nausea and vomiting.  Musculoskeletal: Positive for joint pain.    Past Medical History:  Diagnosis Date  . Anemia   . Anxiety   . Arthritis    "in my joints" (02/05/2017)  . Chronic diastolic CHF (congestive heart failure) ([place])   . CKD (chronic kidney disease), stage IV ([name])    stage IV. previous HD, none currently 10/30/15 (confirmed 02/05/2017)  . Depression    Chronic  . Diabetic peripheral neuropathy ([place])   . Dysrhythmia    tachycardia, normal ECHO 08-09-14  . GERD (gastroesophageal reflux disease)   . Heart murmur    "just dx'd today" (02/05/2017)  . Hepatitis C    "tx'd in 2016;  I'm negative now" (02/05/2017)  . History of blood transfusion 2017   "w/knee replacement"  . Hypertension   . Osteoarthritis of left knee   . Peripheral neuropathy   . Protein calorie malnutrition ([place])   . Septic arthritis ([place])   . Slow transit constipation   . Type II diabetes mellitus ([name])    IDDM  . Uncontrolled hypertension 02/18/2013  . Unsteady gait     Social History  Substance Use Topics  . Smoking status: Current Every Day Smoker    Packs/day: 0.50    Years: 35.00    Types: Cigarettes  . Smokeless tobacco: Never Used  . Alcohol use No     Comment: per pt no alcohol since 09/2014    Family History  Problem Relation Age of Onset  . Cancer Mother   . Heart disease  Father   . Cancer Sister   . Hypertension Sister   . Cancer Brother   . Diabetes Brother   . Hypertension Brother    Allergies  Allergen Reactions  . Compazine [Prochlorperazine] Shortness Of Breath and Swelling    TONGUE SWELLS  . Shellfish-Derived Products Anaphylaxis  . Iodinated Diagnostic Agents Hives and Rash  . Omnipaque [Iohexol] Hives    OBJECTIVE: Blood pressure (!) 125/53, pulse 88, temperature 98.8 F (37.1 C), temperature source Oral, resp. rate 16, height 5\' 7"  (1.702 m), weight 153 lb 3.5 oz (69.5 kg), last menstrual period 03/16/2002, SpO2 (!) 88 %.  Physical Exam  Constitutional: She is oriented to person, place, and time.  She is alert and in good spirits sitting up in bed.  Cardiovascular: Normal rate and regular rhythm.   No murmur heard. Pulmonary/Chest: Effort normal and breath sounds normal.  Abdominal: Soft. There is no tenderness.  Musculoskeletal:  Her left leg is in an Ace wrap and softer brace.  Neurological: She is alert and oriented to person, place, and time.  Skin: No rash noted.  Psychiatric: Mood and affect normal.    Lab Results Lab Results  Component Value Date   WBC 15.0 (H) 02/07/2017   HGB 9.5 (L) 02/07/2017   HCT 28.9 (L) 02/07/2017   MCV 84.5 02/07/2017   PLT 391 02/07/2017    Lab Results  Component Value Date   CREATININE 3.67 (H) 02/07/2017   BUN 72 (H) 02/07/2017   NA 138 02/07/2017   K 4.4 02/07/2017   CL 106 02/07/2017   CO2 21 (L) 02/07/2017    Lab Results  Component Value Date   ALT 20 02/05/2017   AST 16 02/05/2017   ALKPHOS 117 02/05/2017   BILITOT 0.3 02/05/2017     Microbiology: Recent Results (from the past 240 hour(s))  Gram stain     Status: None   Collection Time: 02/03/17  4:34 PM  Result Value Ref Range Status   Gram Stain Moderate  Final   Gram Stain WBC present-predominately PMN  Final   Gram Stain No Organisms Seen  Final  Surgical pcr screen     Status: None   Collection Time: 02/05/17   9:20 PM  Result Value Ref Range Status   MRSA, PCR NEGATIVE NEGATIVE Final   Staphylococcus aureus NEGATIVE NEGATIVE Final    Comment:        The Xpert SA Assay (FDA approved for NASAL specimens in patients over 73 years of age), is one component of a comprehensive surveillance program.  Test performance has been validated by [facility] for patients greater than or equal to  40 year old. It is not intended to diagnose infection nor to guide or monitor treatment.   Aerobic/Anaerobic Culture (surgical/deep wound)     Status: None (Preliminary result)   Collection Time: 02/06/17  4:22 PM  Result Value Ref Range Status   Specimen Description TISSUE LEFT KNEE  Final   Special Requests NONE  Final   Gram Stain   Final    MODERATE WBC PRESENT,BOTH PMN AND MONONUCLEAR NO ORGANISMS SEEN    Culture NO GROWTH 1 DAY  Final   Report Status PENDING  Incomplete  Aerobic/Anaerobic Culture (surgical/deep wound)     Status: None (Preliminary result)   Collection Time: 02/06/17  5:01 PM  Result Value Ref Range Status   Specimen Description TISSUE  Final   Special Requests LEFT FEMORAL CANNEL  Final   Gram Stain   Final    FEW WBC PRESENT, PREDOMINANTLY MONONUCLEAR NO ORGANISMS SEEN    Culture NO GROWTH 1 DAY  Final   Report Status PENDING  Incomplete    Michel Bickers, MD [facility] [phone] pager   [phone] cell 02/07/2017, 4:25 PM

## 2017-02-07 NOTE — Progress Notes (Signed)
Pharmacy Antibiotic Note  Brandy Houston is a 61 y.o. female admitted on 02/05/2017 with joint infection.  Pharmacy has been consulted for daptomycin dosing.  Patient was on vancomycin, last received a dose 8/24 at ~1600 on a q48h regimen.   Plan: Daptomycin 420mg  (6mg /kg) IV q48h- start this dose 8/26 at 1000 since dose of vancomycin given will cover patient until then Follow renal function/HD plans, clinical progression, c/s, ID recommendations  Height: 5\' 7"  (170.2 cm) Weight: 153 lb 3.5 oz (69.5 kg) IBW/kg (Calculated) : 61.6  Temp (24hrs), Avg:98.1 F (36.7 C), Min:97.2 F (36.2 C), Max:99.3 F (37.4 C)   Recent Labs Lab 02/03/17 1635 02/05/17 1844 02/06/17 0815 02/07/17 0238  WBC 12.4* 10.3 8.1 15.0*  CREATININE  --  3.87* 3.66* 3.67*    Estimated Creatinine Clearance: 15.5 mL/min (A) (by C-G formula based on SCr of 3.67 mg/dL (H)).    Allergies  Allergen Reactions  . Compazine [Prochlorperazine] Shortness Of Breath and Swelling    TONGUE SWELLS  . Shellfish-Derived Products Anaphylaxis  . Iodinated Diagnostic Agents Hives and Rash  . Omnipaque [Iohexol] Hives    Antimicrobials this admission: Vancomycin 8/24 >> 8/25 Dapto 8/26 >>   Dose adjustments this admission: n/a  Microbiology results: 8/23 MRSA PCR: neg  8/24 L femoral cannel: 8/24 L knee: 8/25 BCx:  Thank you for allowing pharmacy to be a part of this patient's care.  Amairany Schumpert D. Nathanal Hermiz, PharmD, BCPS Clinical Pharmacist [phone] 02/07/2017 6:04 PM

## 2017-02-07 NOTE — Progress Notes (Addendum)
PROGRESS NOTE    Brandy Houston  HER:[id number] DOB: [birth year] DOA: 02/05/2017 PCP: [name], MD   No chief complaint on file.  Brief Narrative:  HPI On 02/05/2017 Brandy Houston is a 61 y.o. female with a medical history of diastolic heart failure, chronic kidney disease, diabetes, hypertension, septic arthritis, presented to the hospital from Dr. [name] office (ortho). Patient presented to his office on 02/03/2017 for left knee pain, status post aspiration. Fluid showed greater than 58,000 WBC. Patient states over the past several months, she's been having problems walking and her knee has been popping out of place. Currently patient denies any chest pain, shortness breath, abdominal pain, nausea vomiting, diarrhea or constipation, dizziness or headache. She denies any recent trauma or falls. Denies any recent travel or ill contacts. Patient has been seeing a hematologist for hemarthrosis and has had several bloody knee effusions. Per documentation, unlikely to be coagulopathy. Patient has not been on any antibiotics recently.  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 [name] -s/p aspiration yielding 120 mL's of bloody fluid (on 02/03/2017) -Fluid shows 58,000 WBC. Gram stain showed no organisms -s/p left total knee excision arthroplasty and placement of articulating methylmethacrylate antibiotic spacer; debridement of bone, muscle and skin 15x12cm; wound repair left knee 15cm -PT/OT consulted -Continue pain control -Discussed with Dr. [name], he will consult ID -Wound cultures show no growth to date -Blood cultures pending -Started on vancomycin   Chronic diastolic heart failure -Echocardiogram February 2016 showed a grade 2 diastolic dysfunction -Currently patient appears to be euvolemic and compensated -Takes metolazone and Lasix at home- currently held due to AKI -Patient was given IV lasix between  blood transfusions to avoid volume overload -Monitor intake and output, daily weights -urine output over past 24hrs 1275cc  Acute kidney injury vs Chronic kidney disease, stage IV -Patient follows with Dr. 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 Early evidence of diabetic glomerulopathy -Creatinine 3.67 (baseline 2.3-2.5) -In July 2017, Creatinine was 3.7- ?new baseline  -Possibly due to anemia vs diuretics (patient was given IV lasix between blood transfusions) -Nephrology consulted and appreciated -Continue to monitor BMP  -Continue foley catheter for accurate monitoring of I/Os given that patient has received IV lasix and has worsening renal function  Leukocytosis  -WBC up to 15 today, likely reactive to recent surgery vs possible knee infection -Currently on vancomycin -Blood cultures pending  -will also obtain CXR -Will continue to monitor CBC  Diabetes mellitus, type II with neuropathy -Patient uses Lantus 15 units daily at home -Continue Lantus, insulin sliding scale was CBG monitoring -Hemoglobin A1c 6.2 -Continue gabapentin for neuropathy  Essential hypertension -Continue hydralazine, metoprolol -Will discontinue amlodipine, may aid in perfusion  Depression -Continue Zoloft  Tobacco abuse -Smoking cessation discussed -Continue Nicotine patch  Anemia of chronic disease/ hemarthrosis/ Anemia due to blood loss -Hemoglobin was 7.1 on admission (baseline hemoglobin 9over the past year) -Has had a history of hemarthrosis and was seeing oncology. Patient was receiving Aranesp injections every 30 days. -Patient was transfused 2 units PRBC on 02/05/2017, will order additional 2 units prior to surgery, and patient will likely need an additional 2 units post. -Patient denies any complaints of melena or  hematochezia -Recently had anemia panel on 02/02/2017 which showed iron of 18, ferritin of 52 -  Continue iron supplementation  -Continue to monitor CBC  Hepatitis C -Per patient, stable -Continue outpatient follow up  DVT Prophylaxis  SCDs/lovenox  Code Status: Full  Family Communication: None at bedside  Disposition Plan: Admitted. Currently in ICU (placed overnight for closer monitoring postop), will transfer to medical floor.   Consultants Orthopedic surgery, Dr. [name] Nephrology  Procedures  Left total knee excision arthroplasty and placement of articulating methylmethacrylate antibiotic spacer; debridement of bone, muscle and skin 15x12cm; wound repair left knee 15cm  Antibiotics   Anti-infectives    Start     Dose/Rate Route Frequency Ordered Stop   02/08/17 1600  vancomycin (VANCOCIN) IVPB 1000 mg/200 mL premix     1,000 mg 200 mL/hr over 60 Minutes Intravenous Every 48 hours 02/06/17 2214     02/06/17 1806  tobramycin (NEBCIN) powder  Status:  Discontinued       As needed 02/06/17 1841 02/06/17 1900   02/06/17 1610  vancomycin (VANCOCIN) powder  Status:  Discontinued       As needed 02/06/17 1611 02/06/17 1900   02/06/17 1458  vancomycin (VANCOCIN) 1-5 GM/200ML-% IVPB    Comments:  Schonewitz, Leigh   : cabinet override      02/06/17 1458 02/07/17 0259      Subjective:   [name] seen and examined today.   Continues to complain of severe knee pain. Denies chest pain, shortness of breath, abdominal pain, nausea vomiting, diarrhea or constipation. States she was supposed receive IV iron one week ago however did not make it to that appointment.  Objective:   Vitals:   02/07/17 1000 02/07/17 1100 02/07/17 1121 02/07/17 1200  BP: 115/72 (!) 107/52  (!) 111/52  Pulse: 81 79  76  Resp: 13 15  13   Temp:   98.8 F (37.1 C)   TempSrc:   Oral   SpO2: 95% 100%  100%  Weight:      Height:        Intake/Output Summary (Last 24 hours) at 02/07/17 1235 Last data  filed at 02/07/17 1200  Gross per 24 hour  Intake             3310 ml  Output             1775 ml  Net             1535 ml   Filed Weights   02/06/17 1430 02/06/17 2218 02/07/17 0400  Weight: 67.1 kg (148 lb) 69.2 kg (152 lb 8.9 oz) 69.5 kg (153 lb 3.5 oz)   Exam  General: Well developed, well nourished, NAD, appears stated age  HEENT: NCAT, mucous membranes moist.   Cardiovascular: S1 S2 auscultated, 2/6SEM, RRR  Respiratory: Diminished breath sounds, mild rhonchi noted in right lung, otherwise clear  Abdomen: Soft, nontender, nondistended, + bowel sounds  Extremities: warm dry without cyanosis clubbing, edema of the RLE. Dressing in place on LLE, in brace.   Neuro: AAOx3, nonfocal  Psych: Appropriate  Data Reviewed: I have personally reviewed following labs and imaging studies  CBC:  Recent Labs Lab 02/02/17 1218 02/03/17 1635 02/05/17 1844 02/06/17 0815 02/07/17 0238  WBC  --  12.4* 10.3 8.1 15.0*  NEUTROABS  --  9.3* 7.7  --   --   HGB 8.1* 7.7* 7.1* 8.8* 9.5*  HCT  --  24.9* 22.1* 26.8* 28.9*  MCV  --  92 84.7 85.4 84.5  PLT  --   --  521* 464* 391   Basic  Metabolic Panel:  Recent Labs Lab 02/05/17 1844 02/06/17 0815 02/07/17 0238  NA 134* 137 138  K 3.4* 3.6 4.4  CL 101 103 106  CO2 21* 21* 21*  GLUCOSE 180* 88 167*  BUN 79* 77* 72*  CREATININE 3.87* 3.66* 3.67*  CALCIUM 8.7* 8.8* 8.0*  MG 2.1  --   --    GFR: Estimated Creatinine Clearance: 15.5 mL/min (A) (by C-G formula based on SCr of 3.67 mg/dL (H)). Liver Function Tests:  Recent Labs Lab 02/05/17 1844  AST 16  ALT 20  ALKPHOS 117  BILITOT 0.3  PROT 7.8  ALBUMIN 2.6*   No results for input(s): LIPASE, AMYLASE in the last 168 hours. No results for input(s): AMMONIA in the last 168 hours. Coagulation Profile: No results for input(s): INR, PROTIME in the last 168 hours. Cardiac Enzymes: No results for input(s): CKTOTAL, CKMB, CKMBINDEX, TROPONINI in the last 168 hours. BNP  (last 3 results) No results for input(s): PROBNP in the last 8760 hours. HbA1C:  Recent Labs  02/05/17 1844  HGBA1C 6.2*   CBG:  Recent Labs Lab 02/06/17 1246 02/06/17 1415 02/06/17 1904 02/07/17 0728 02/07/17 1119  GLUCAP 90 90 131* 118* 147*   Lipid Profile: No results for input(s): CHOL, HDL, LDLCALC, TRIG, CHOLHDL, LDLDIRECT in the last 72 hours. Thyroid Function Tests: No results for input(s): TSH, T4TOTAL, FREET4, T3FREE, THYROIDAB in the last 72 hours. Anemia Panel: No results for input(s): VITAMINB12, FOLATE, FERRITIN, TIBC, IRON, RETICCTPCT in the last 72 hours. Urine analysis:    Component Value Date/Time   COLORURINE YELLOW 10/29/2015 1425   APPEARANCEUR CLEAR 10/29/2015 1425   LABSPEC 1.017 10/29/2015 1425   PHURINE 5.5 10/29/2015 1425   GLUCOSEU NEGATIVE 10/29/2015 1425   HGBUR NEGATIVE 10/29/2015 1425   BILIRUBINUR NEGATIVE 10/29/2015 1425   KETONESUR NEGATIVE 10/29/2015 1425   PROTEINUR 100 (A) 10/29/2015 1425   UROBILINOGEN 0.2 09/02/2014 1504   NITRITE NEGATIVE 10/29/2015 1425   LEUKOCYTESUR SMALL (A) 10/29/2015 1425   Sepsis Labs: @LABRCNTIP (procalcitonin:4,lacticidven:4)  ) Recent Results (from the past 240 hour(s))  Gram stain     Status: None   Collection Time: 02/03/17  4:34 PM  Result Value Ref Range Status   Gram Stain Moderate  Final   Gram Stain WBC present-predominately PMN  Final   Gram Stain No Organisms Seen  Final  Surgical pcr screen     Status: None   Collection Time: 02/05/17  9:20 PM  Result Value Ref Range Status   MRSA, PCR NEGATIVE NEGATIVE Final   Staphylococcus aureus NEGATIVE NEGATIVE Final    Comment:        The Xpert SA Assay (FDA approved for NASAL specimens in patients over 24 years of age), is one component of a comprehensive surveillance program.  Test performance has been validated by [facility] for patients greater than or equal to 62 year old. It is not intended to diagnose infection nor to guide  or monitor treatment.   Aerobic/Anaerobic Culture (surgical/deep wound)     Status: None (Preliminary result)   Collection Time: 02/06/17  4:22 PM  Result Value Ref Range Status   Specimen Description TISSUE LEFT KNEE  Final   Special Requests NONE  Final   Gram Stain   Final    MODERATE WBC PRESENT,BOTH PMN AND MONONUCLEAR NO ORGANISMS SEEN    Culture NO GROWTH 1 DAY  Final   Report Status PENDING  Incomplete  Aerobic/Anaerobic Culture (surgical/deep wound)     Status:  None (Preliminary result)   Collection Time: 02/06/17  5:01 PM  Result Value Ref Range Status   Specimen Description TISSUE  Final   Special Requests LEFT FEMORAL CANNEL  Final   Gram Stain   Final    FEW WBC PRESENT, PREDOMINANTLY MONONUCLEAR NO ORGANISMS SEEN    Culture NO GROWTH 1 DAY  Final   Report Status PENDING  Incomplete      Radiology Studies: Dg Knee Left Port  Result Date: 02/06/2017 CLINICAL DATA:  Total knee replacement status EXAM: PORTABLE LEFT KNEE - 1-2 VIEW COMPARISON:  02/08/2016 FINDINGS: Interval removal of left knee arthroplasty with placement of an antibiotic spacer and antibiotic beads. Associated soft tissue swelling and suprapatellar knee joint effusion. Vascular calcifications. IMPRESSION: Interval removal of left knee arthroplasty with placement of an antibiotic spacer and antibiotic beads. Electronically Signed   By: Julian Hy M.D.   On: 02/06/2017 21:50     Scheduled Meds: . amLODipine  10 mg Oral QHS  . calcitRIOL  0.25 mcg Oral Q M,W,F  . enoxaparin (LOVENOX) injection  30 mg Subcutaneous Daily  . ferrous sulfate  325 mg Oral BID WC  . furosemide  20 mg Intravenous Once  . gabapentin  100 mg Oral TID  . hydrALAZINE  100 mg Oral BID  . insulin aspart  0-9 Units Subcutaneous TID WC  . insulin glargine  15 Units Subcutaneous QAC breakfast  . mouth rinse  15 mL Mouth Rinse BID  . methocarbamol  500 mg Oral BID  . metoprolol succinate  50 mg Oral BID  . multivitamin  with minerals  1 tablet Oral Daily  . nicotine  14 mg Transdermal Daily  . pantoprazole  40 mg Oral Daily  . senna-docusate  2 tablet Oral QHS  . sertraline  100 mg Oral Daily  . sodium chloride flush  3 mL Intravenous Q12H   Continuous Infusions: . sodium chloride    . sodium chloride 10 mL/hr at 02/07/17 0700  . tranexamic acid (CYKLOKAPRON) topical -INTRAOP    . [START ON 02/08/2017] vancomycin       LOS: 2 days   Time Spent in minutes   35 minutes  Dachelle Molzahn D.O. on 02/07/2017 at 12:35 PM  Between 7am to 7pm - Pager - [phone]  After 7pm go to [email or web address] - password [email or web address]  And look for the night coverage person covering for me after hours  [facility] Office  [phone]

## 2017-02-07 NOTE — Progress Notes (Signed)
Received patient from [place], alert oriented, not in any distress, temp at 101.3 orally. MD made aware. PRN meds given. Will continue to monitor. Oriented to unit and staff.

## 2017-02-07 NOTE — Consult Note (Signed)
Renal Service Consult Note [facility]  Brandy Houston 02/07/2017 Roney Jaffe D Requesting Physician:  Dr Ree Kida  Reason for Consult:  Acute on CRF HPI: The patient is a 61 y.o. year-old with history of DM2, HTN, hep C and CKD stage 4/5.  Pt was on dialysis for about 6 mos 1-2 years ago, but was able to come off.  Admitted with infected L knee prosthetic joint on 8/23.  Creat was 3.8 on admission and 3.6 today.  Baseline is 2.3- 2.7.  Asked to see for renal failure.     Pt f/b Dr [name] at [facility] for CKD stage IV.  Denies any OTC nsaid use.  Hasn't had n/v/d recently, but oral intake has been poor since getting sick.  Deneis any SOB/ CP, abd pain.     ROS  denies CP  no HA  no blurry vision  no rash  no dysuria  no difficulty voiding  no change in urine color    Past Medical History  Past Medical History:  Diagnosis Date  . Anemia   . Anxiety   . Arthritis    "in my joints" (02/05/2017)  . Chronic diastolic CHF (congestive heart failure) ([place])   . CKD (chronic kidney disease), stage IV ([name])    stage IV. previous HD, none currently 10/30/15 (confirmed 02/05/2017)  . Depression    Chronic  . Diabetic peripheral neuropathy ([place])   . Dysrhythmia    tachycardia, normal ECHO 08-09-14  . GERD (gastroesophageal reflux disease)   . Heart murmur    "just dx'd today" (02/05/2017)  . Hepatitis C    "tx'd in 2016; I'm negative now" (02/05/2017)  . History of blood transfusion 2017   "w/knee replacement"  . Hypertension   . Osteoarthritis of left knee   . Peripheral neuropathy   . Protein calorie malnutrition ([place])   . Septic arthritis ([place])   . Slow transit constipation   . Type II diabetes mellitus ([name])    IDDM  . Uncontrolled hypertension 02/18/2013  . Unsteady gait    Past Surgical History  Past Surgical History:  Procedure Laterality Date  . APPENDECTOMY    . AV FISTULA PLACEMENT Left 09/13/2014   Procedure: Brachial Artery to Brachial Vein Gortex Four -  Seven Stretch GRAFT INSERTION Left Forearm;  Surgeon: Mal Misty, MD;  Location: [place];  Service: Vascular;  Laterality: Left;  . CHOLECYSTECTOMY OPEN    . COLON SURGERY    . KNEE ARTHROSCOPY Right 08/10/2014   Procedure: ARTHROSCOPY I & D KNEE;  Surgeon: Marianna Payment, MD;  Location: [facility];  Service: Orthopedics;  Laterality: Right;  . KNEE ARTHROSCOPY Left 08/11/2014   Procedure: ARTHROSCOPIC WASHOUT LEFT KNEE;  Surgeon: Marianna Payment, MD;  Location: [place];  Service: Orthopedics;  Laterality: Left;  . KNEE ARTHROSCOPY Left 08/19/2014   Procedure: ARTHROSCOPIC WASHOUT LEFT KNEE;  Surgeon: Leandrew Koyanagi, MD;  Location: [place];  Service: Orthopedics;  Laterality: Left;  . KNEE ARTHROSCOPY WITH LATERAL MENISECTOMY Left 04/04/2015   Procedure: AND PARTIAL LATERAL MENISECTOMY;  Surgeon: Leandrew Koyanagi, MD;  Location: [place];  Service: Orthopedics;  Laterality: Left;  . KNEE ARTHROSCOPY WITH MEDIAL MENISECTOMY Left 04/04/2015   Procedure: LEFT KNEE ARTHROSCOPY WITH PARTIAL MEDIAL MENISCECTOMY  AND SYNOVECTOMY;  Surgeon: Leandrew Koyanagi, MD;  Location: [place];  Service: Orthopedics;  Laterality: Left;  . SHOULDER ARTHROSCOPY Bilateral 08/10/2014   Procedure: I & D BILATERAL SHOULDERS ;  Surgeon: Marianna Payment, MD;  Location: [facility];  Service: Orthopedics;  Laterality: Bilateral;  . SMALL INTESTINE SURGERY     Due to Small Bowel Obstruction; "fixed it when they did my gallbladder OR"  . TEE WITHOUT CARDIOVERSION N/A 08/14/2014   Procedure: TRANSESOPHAGEAL ECHOCARDIOGRAM (TEE);  Surgeon: Thayer Headings, MD;  Location: [place];  Service: Cardiovascular;  Laterality: N/A;  . TENOSYNOVECTOMY Right 08/11/2014   Procedure: RIGHT WRIST IRRIGATION AND DEBRIDEMENT, TENOSYNOVECTOMY;  Surgeon: Marianna Payment, MD;  Location: [place];  Service: Orthopedics;  Laterality: Right;  . TOTAL KNEE ARTHROPLASTY Left 11/07/2015   Procedure: LEFT TOTAL KNEE ARTHROPLASTY  WITH REVISION OF IMPLANTS;  Surgeon: Leandrew Koyanagi, MD;  Location: [place];  Service: Orthopedics;  Laterality: Left;  . TUBAL LIGATION     Family History  Family History  Problem Relation Age of Onset  . Cancer Mother   . Heart disease Father   . Cancer Sister   . Hypertension Sister   . Cancer Brother   . Diabetes Brother   . Hypertension Brother    Social History  reports that she has been smoking Cigarettes.  She has a 17.50 pack-year smoking history. She has never used smokeless tobacco. She reports that she does not drink alcohol or use drugs. Allergies  Allergies  Allergen Reactions  . Compazine [Prochlorperazine] Shortness Of Breath and Swelling    TONGUE SWELLS  . Shellfish-Derived Products Anaphylaxis  . Iodinated Diagnostic Agents Hives and Rash  . Omnipaque [Iohexol] Hives   Home medications Prior to Admission medications   Medication Sig Start Date End Date Taking? Authorizing Provider  acetaminophen (TYLENOL) 500 MG tablet Take 1,000 mg by mouth every 6 (six) hours as needed for mild pain.   Yes [provider]  amLODipine (NORVASC) 10 MG tablet Take 10 mg by mouth at bedtime.    Yes [provider]  aspirin EC 81 MG tablet Take 81 mg by mouth daily.   Yes [provider]  Blood Glucose Monitoring Suppl (ACCU-CHEK AVIVA PLUS) W/DEVICE KIT Check sugars TID for E11.65 01/29/15  Yes Chari Manning A, NP  calcitRIOL (ROCALTROL) 0.25 MCG capsule Take 0.25 mcg by mouth every Monday, Wednesday, and Friday.   Yes [provider]  calcium carbonate (TUMS EX) 750 MG chewable tablet Chew 1 tablet by mouth 3 (three) times daily as needed for heartburn.    Yes [provider]  Darbepoetin Alfa (ARANESP) 100 MCG/0.5ML SOSY injection Inject 100 mcg into the skin every 30 (thirty) days.   Yes [provider]  diphenhydrAMINE (BENADRYL) 25 mg capsule Take 1 capsule (25 mg total) by mouth every 6 (six) hours as needed for itching. 09/28/14   Yes Love, Ivan Anchors, PA-C  ferrous sulfate 325 (65 FE) MG tablet Take 325 mg by mouth 2 (two) times daily with a meal.   Yes [provider]  furosemide (LASIX) 80 MG tablet Take 160 mg by mouth 2 (two) times daily.    Yes [provider]  gabapentin (NEURONTIN) 100 MG capsule Take 1 capsule (100 mg total) by mouth 2 (two) times daily. Patient taking differently: Take 100 mg by mouth 3 (three) times daily.  12/06/14  Yes Lance Bosch, NP  glucose blood (ACCU-CHEK AVIVA) test strip Check sugars TID for E11.65 Patient taking differently: 1 each by Other route 2 (two) times daily. Check sugars TID for E11.65 01/29/15  Yes Chari Manning A, NP  hydrALAZINE (APRESOLINE) 100 MG tablet Take  100 mg by mouth 2 (two) times daily.  10/17/15  Yes [provider]  insulin glargine (LANTUS) 100 UNIT/ML injection Inject 15 Units into the skin daily before breakfast.    Yes [provider]  Lancet Devices (ACCU-CHEK SOFTCLIX) lancets Check sugars TID for E11.65 Patient taking differently: 1 each by Other route 2 (two) times daily. Check sugars TID for E11.65 01/29/15  Yes Chari Manning A, NP  methocarbamol (ROBAXIN) 500 MG tablet TAKE 1 TABLET BY MOUTH EVERY 6 HOURS AS NEEDED FOR MUSCLE SPASM 01/19/17  Yes Leandrew Koyanagi, MD  metolazone (ZAROXOLYN) 2.5 MG tablet Take 2.5 mg by mouth daily.   Yes [provider]  metoprolol succinate (TOPROL-XL) 50 MG 24 hr tablet Take 50 mg by mouth 2 (two) times daily.  10/17/15  Yes [provider]  Multiple Vitamin (MULTIVITAMIN WITH MINERALS) TABS tablet Take 1 tablet by mouth daily.   Yes [provider]  omeprazole (PRILOSEC) 20 MG capsule Take 20 mg by mouth daily before breakfast.  10/04/15  Yes [provider]  Potassium Chloride ER 20 MEQ TBCR Take 20 mEq by mouth 2 (two) times daily.  10/04/15  Yes [provider]  sertraline (ZOLOFT) 100 MG tablet Take 1 tablet (100 mg total) by mouth daily. 03/15/15   Yes Carlyle Basques, MD  traMADol (ULTRAM) 50 MG tablet Take 1-2 tablets (50-100 mg total) by mouth every 6 (six) hours as needed. Patient taking differently: Take 50-100 mg by mouth every 6 (six) hours as needed (for pain.).  02/03/17  Yes Leandrew Koyanagi, MD  Diclofenac Sodium 2 % SOLN Place 2 g onto the skin 2 (two) times daily as needed. 08/19/16   Leandrew Koyanagi, MD  sennosides-docusate sodium (SENOKOT-S) 8.6-50 MG tablet Take 2 tablets by mouth at bedtime.    [provider]   Liver Function Tests  Recent Labs Lab 02/05/17 1844  AST 16  ALT 20  ALKPHOS 117  BILITOT 0.3  PROT 7.8  ALBUMIN 2.6*   No results for input(s): LIPASE, AMYLASE in the last 168 hours. CBC  Recent Labs Lab 02/03/17 1635 02/05/17 1844 02/06/17 0815 02/07/17 0238  WBC 12.4* 10.3 8.1 15.0*  NEUTROABS 9.3* 7.7  --   --   HGB 7.7* 7.1* 8.8* 9.5*  HCT 24.9* 22.1* 26.8* 28.9*  MCV 92 84.7 85.4 84.5  PLT  --  521* 464* 680   Basic Metabolic Panel  Recent Labs Lab 02/05/17 1844 02/06/17 0815 02/07/17 0238  NA 134* 137 138  K 3.4* 3.6 4.4  CL 101 103 106  CO2 21* 21* 21*  GLUCOSE 180* 88 167*  BUN 79* 77* 72*  CREATININE 3.87* 3.66* 3.67*  CALCIUM 8.7* 8.8* 8.0*   Iron/TIBC/Ferritin/ %Sat    Component Value Date/Time   IRON 18 (L) 02/02/2017 1211   TIBC 237 (L) 02/02/2017 1211   FERRITIN 52 02/02/2017 1211   IRONPCTSAT 8 (L) 02/02/2017 1211    Vitals:   02/07/17 1300 02/07/17 1400 02/07/17 1500 02/07/17 1600  BP: (!) 107/51 (!) 106/49 (!) 125/53   Pulse: 76 77 85 88  Resp: _0 Temp:    99.3 F (37.4 C)  TempSrc:    Oral  SpO2: 100% 95% 96% (!) 88%  Weight:      Height:       Exam Gen alert, no distress, calm No rash, cyanosis or gangrene Sclera anicteric, throat clear  No jvd or bruits Chest clear bilat  RRR 2/6 SEM, no RG Abd soft ntnd no mass or ascites +bs GU defer MS left leg in a brace Ext no RLE edema / no wounds or ulcers Neuro is alert, Ox 3 ,  nf    Home meds:  -norvasc/ lasix 160 bid/ zaroxolyn 2.5 qd/ toprol-xl 50 bid/ hydralazine 100 bid -zoloft/ ultram prn/ robaxin prn/ neurontin 100 bid -asa/ tums/ lantus 15 qam/ PPI/ KCL/ MVI/ vit D    Impression: 1.  Acute on CKD 4 - baseline creat 2.3- 2.7.  Creat 3.6 now. Prob vol depletion and /or hypotension / sepsis. Will start IVF"s and decrease BP meds, let BP come up. Will follow.  2.  HTN - bp's soft, as above 3.  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  DM2  5.  Volume - has room for volume    Plan - as above  Kelly Splinter MD [place] pager [phone]   02/07/2017, 5:06 PM

## 2017-02-08 DIAGNOSIS — R509 Fever, unspecified: Secondary | ICD-10-CM | POA: Diagnosis not present

## 2017-02-08 DIAGNOSIS — T8454XD Infection and inflammatory reaction due to internal left knee prosthesis, subsequent encounter: Secondary | ICD-10-CM

## 2017-02-08 LAB — POCT I-STAT 4, (NA,K, GLUC, HGB,HCT)
Glucose, Bld: 135 mg/dL — ABNORMAL HIGH (ref 65–99)
HCT: 26 % — ABNORMAL LOW (ref 36.0–46.0)
Hemoglobin: 8.8 g/dL — ABNORMAL LOW (ref 12.0–15.0)
Potassium: 3.7 mmol/L (ref 3.5–5.1)
Sodium: 140 mmol/L (ref 135–145)

## 2017-02-08 LAB — BODY FLUID CULTURE
Gram Stain: NONE SEEN
Organism ID, Bacteria: NO GROWTH

## 2017-02-08 LAB — URINALYSIS, ROUTINE W REFLEX MICROSCOPIC
Bilirubin Urine: NEGATIVE
Glucose, UA: NEGATIVE mg/dL
Ketones, ur: NEGATIVE mg/dL
Nitrite: NEGATIVE
Protein, ur: 100 mg/dL — AB
Specific Gravity, Urine: 1.014 (ref 1.005–1.030)
pH: 5 (ref 5.0–8.0)

## 2017-02-08 LAB — BASIC METABOLIC PANEL
Anion gap: 12 (ref 5–15)
BUN: 73 mg/dL — ABNORMAL HIGH (ref 6–20)
CO2: 22 mmol/L (ref 22–32)
Calcium: 8.2 mg/dL — ABNORMAL LOW (ref 8.9–10.3)
Chloride: 105 mmol/L (ref 101–111)
Creatinine, Ser: 3.74 mg/dL — ABNORMAL HIGH (ref 0.44–1.00)
GFR calc Af Amer: 14 mL/min — ABNORMAL LOW (ref >60)
GFR calc non Af Amer: 12 mL/min — ABNORMAL LOW (ref >60)
Glucose, Bld: 101 mg/dL — ABNORMAL HIGH (ref 65–99)
Potassium: 4.4 mmol/L (ref 3.5–5.1)
Sodium: 139 mmol/L (ref 135–145)

## 2017-02-08 LAB — CBC
HCT: 26 % — ABNORMAL LOW (ref 36.0–46.0)
Hemoglobin: 8.5 g/dL — ABNORMAL LOW (ref 12.0–15.0)
MCH: 28.5 pg (ref 26.0–34.0)
MCHC: 32.7 g/dL (ref 30.0–36.0)
MCV: 87.2 fL (ref 78.0–100.0)
Platelets: 375 10*3/uL (ref 150–400)
RBC: 2.98 MIL/uL — ABNORMAL LOW (ref 3.87–5.11)
RDW: 16.1 % — ABNORMAL HIGH (ref 11.5–15.5)
WBC: 12.2 10*3/uL — ABNORMAL HIGH (ref 4.0–10.5)

## 2017-02-08 LAB — GLUCOSE, CAPILLARY
Glucose-Capillary: 106 mg/dL — ABNORMAL HIGH (ref 65–99)
Glucose-Capillary: 135 mg/dL — ABNORMAL HIGH (ref 65–99)
Glucose-Capillary: 105 mg/dL — ABNORMAL HIGH (ref 65–99)
Glucose-Capillary: 129 mg/dL — ABNORMAL HIGH (ref 65–99)

## 2017-02-08 LAB — CK: Total CK: 77 U/L (ref 38–234)

## 2017-02-08 MED ORDER — SODIUM CHLORIDE 0.9 % IV SOLN
420.0000 mg | INTRAVENOUS | Status: DC
  Administered 2017-02-10 – 2017-02-18 (×5): 420 mg via INTRAVENOUS
  Filled 2017-02-08 (×7): qty 8.4

## 2017-02-08 NOTE — Progress Notes (Signed)
PROGRESS NOTE    Brandy Houston  MWU:[id number] DOB: [birth year] DOA: 02/05/2017 PCP: [name], MD   No chief complaint on file.  Brief Narrative:  HPI On 02/05/2017 Brandy Houston is a 61 y.o. female with a medical history of diastolic heart failure, chronic kidney disease, diabetes, hypertension, septic arthritis, presented to the hospital from Dr. [name] office (ortho). Patient presented to his office on 02/03/2017 for left knee pain, status post aspiration. Fluid showed greater than 58,000 WBC. Patient states over the past several months, she's been having problems walking and her knee has been popping out of place. Currently patient denies any chest pain, shortness breath, abdominal pain, nausea vomiting, diarrhea or constipation, dizziness or headache. She denies any recent trauma or falls. Denies any recent travel or ill contacts. Patient has been seeing a hematologist for hemarthrosis and has had several bloody knee effusions. Per documentation, unlikely to be coagulopathy. Patient has not been on any antibiotics recently.  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 [name] -s/p aspiration yielding 120 mL's of bloody fluid (on 02/03/2017) -Fluid shows 58,000 WBC. Gram stain showed no organisms -s/p left total knee excision arthroplasty and placement of articulating methylmethacrylate antibiotic spacer; debridement of bone, muscle and skin 15x12cm; wound repair left knee 15cm -PT/OT consulted -Continue pain control -Wound cultures show no growth to date -Blood cultures show no growth to date -Started on vancomycin, but switched to daptomycin given CKD  Fever -Patient spiked fever overnight of 101.103F -Vitals currently stable, leukocytosis actually improving -Blood cultures show no growth to date, UA and urine culture as listed below -Continue to monitor  Chronic diastolic heart failure -Echocardiogram  February 2016 showed a grade 2 diastolic dysfunction -Currently patient appears to be euvolemic and compensated -Takes metolazone and Lasix at home- currently held due to AKI -Patient was given IV lasix between blood transfusions to avoid volume overload -Monitor intake and output, daily weights -urine output over past 24hrs 250cc (accurate?)  Acute kidney injury vs Chronic kidney disease, stage IV -Patient follows with Dr. 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 Early evidence of diabetic glomerulopathy -Creatinine 3.74 (baseline 2.3-2.5)  -In July 2017, Creatinine was 3.7- ?new baseline  -Possibly due to anemia vs diuretics (patient was given IV lasix between blood transfusions) -Nephrology consulted and appreciated and placed patient on gentle IVF -Continue to monitor BMP  -Continue foley catheter for accurate monitoring of I/Os given that patient has received IV lasix and has worsening renal function  Leukocytosis  -WBC improving today to 12.2 -likely reactive to recent surgery vs possible knee infection -Currently on daptomycin  -Blood cultures show no growth to date -CXR unremarkable for infection -UA TNTC WBC, large leukocyte, few bacteria (patient not complaining of any urinary symptoms) -Obtained urine culture -Continue to monitor CBC  Diabetes mellitus, type II with neuropathy -Patient uses Lantus 15 units daily at home -Continue Lantus, insulin sliding scale was CBG monitoring -Hemoglobin A1c 6.2 -Continue gabapentin for neuropathy  Essential hypertension -Continue hydralazine -Will discontinue amlodipine, may aid in perfusion -metoprolol discontinued today  Depression -Continue Zoloft  Tobacco abuse -Smoking cessation discussed -Continue Nicotine patch  Anemia of chronic disease/ hemarthrosis/ Anemia  due to blood loss -Hemoglobin was 7.1 on admission (baseline hemoglobin 9over the past year) -Has had a history  of hemarthrosis and was seeing oncology. Patient was receiving Aranesp injections every 30 days. -Patient denies any complaints of melena or hematochezia -Recently had anemia panel on 02/02/2017 which showed iron of 18, ferritin of 52 -Given 5uPRBC since admission -Hemoglobin currently 8.5 -Continue iron supplementation  -Continue to monitor CBC  Hepatitis C -Per patient, stable -Continue outpatient follow up  DVT Prophylaxis  SCDs/lovenox  Code Status: Full  Family Communication: None at bedside  Disposition Plan: Admitted. Dispo pending  Consultants Orthopedic surgery, Dr. [name] Nephrology  Procedures  Left total knee excision arthroplasty and placement of articulating methylmethacrylate antibiotic spacer; debridement of bone, muscle and skin 15x12cm; wound repair left knee 15cm  Antibiotics   Anti-infectives    Start     Dose/Rate Route Frequency Ordered Stop   02/10/17 1052  DAPTOmycin (CUBICIN) 420 mg in sodium chloride 0.9 % IVPB     420 mg 216.8 mL/hr over 30 Minutes Intravenous Every 48 hours 02/08/17 1255     02/08/17 1600  vancomycin (VANCOCIN) IVPB 1000 mg/200 mL premix  Status:  Discontinued     1,000 mg 200 mL/hr over 60 Minutes Intravenous Every 48 hours 02/06/17 2214 02/07/17 1717   02/08/17 1000  DAPTOmycin (CUBICIN) 420 mg in sodium chloride 0.9 % IVPB  Status:  Discontinued     420 mg 216.8 mL/hr over 30 Minutes Intravenous Every 24 hours 02/07/17 1805 02/08/17 1255   02/06/17 1806  tobramycin (NEBCIN) powder  Status:  Discontinued       As needed 02/06/17 1841 02/06/17 1900   02/06/17 1610  vancomycin (VANCOCIN) powder  Status:  Discontinued       As needed 02/06/17 1611 02/06/17 1900   02/06/17 1458  vancomycin (VANCOCIN) 1-5 GM/200ML-% IVPB    Comments:  Schonewitz, Leigh   : cabinet override      02/06/17 1458 02/07/17 0259       Subjective:   [name] seen and examined today.   Continues to complain of severe knee pain. Denies chest pain, shortness of breath, abdominal pain, nausea vomiting, diarrhea or constipation. States she was supposed receive IV iron one week ago however did not make it to that appointment.  Objective:   Vitals:   02/07/17 2044 02/08/17 0547 02/08/17 0702 02/08/17 1300  BP: (!) 114/50 137/62  (!) 118/54  Pulse: 84 97  93  Resp: 17 17  16   Temp: 100.1 F (37.8 C) (!) 101.7 F (38.7 C) 98.7 F (37.1 C) 98.5 F (36.9 C)  TempSrc: Oral Oral Oral Oral  SpO2: 99% 100%  95%  Weight:  71.1 kg (156 lb 11.2 oz)    Height:        Intake/Output Summary (Last 24 hours) at 02/08/17 1511 Last data filed at 02/08/17 0515  Gross per 24 hour  Intake           863.75 ml  Output                0 ml  Net           863.75 ml   Filed Weights   02/06/17 2218 02/07/17 0400 02/08/17 0547  Weight: 69.2 kg (152 lb 8.9 oz) 69.5 kg (153 lb 3.5 oz) 71.1 kg (156 lb 11.2 oz)   Exam  General: Well developed, well nourished, NAD, appears stated age  HEENT: NCAT, mucous membranes moist.   Cardiovascular: S1 S2 auscultated, 2/6SEM, RRR  Respiratory: Diminished but clear  Abdomen: Soft, nontender, nondistended, + bowel sounds  Extremities: warm dry without cyanosis clubbing or edema or RLE. LLE in dressing/brace  Neuro: AAOx3, nonfocal  Psych: appropriate  Data Reviewed: I have personally reviewed following labs and imaging studies  CBC:  Recent Labs Lab 02/03/17 1635 02/05/17 1844 02/06/17 0815 02/06/17 1754 02/07/17 0238 02/08/17 0337  WBC 12.4* 10.3 8.1  --  15.0* 12.2*  NEUTROABS 9.3* 7.7  --   --   --   --   HGB 7.7* 7.1* 8.8* 8.8* 9.5* 8.5*  HCT 24.9* 22.1* 26.8* 26.0* 28.9* 26.0*  MCV 92 84.7 85.4  --  84.5 87.2  PLT  --  521* 464*  --  391 740   Basic Metabolic Panel:  Recent Labs Lab 02/05/17 1844 02/06/17 0815 02/06/17 1754 02/07/17 0238 02/08/17 0337  NA  134* 137 140 138 139  K 3.4* 3.6 3.7 4.4 4.4  CL 101 103  --  106 105  CO2 21* 21*  --  21* 22  GLUCOSE 180* 88 135* 167* 101*  BUN 79* 77*  --  72* 73*  CREATININE 3.87* 3.66*  --  3.67* 3.74*  CALCIUM 8.7* 8.8*  --  8.0* 8.2*  MG 2.1  --   --   --   --    GFR: Estimated Creatinine Clearance: 15.2 mL/min (A) (by C-G formula based on SCr of 3.74 mg/dL (H)). Liver Function Tests:  Recent Labs Lab 02/05/17 1844  AST 16  ALT 20  ALKPHOS 117  BILITOT 0.3  PROT 7.8  ALBUMIN 2.6*   No results for input(s): LIPASE, AMYLASE in the last 168 hours. No results for input(s): AMMONIA in the last 168 hours. Coagulation Profile: No results for input(s): INR, PROTIME in the last 168 hours. Cardiac Enzymes: No results for input(s): CKTOTAL, CKMB, CKMBINDEX, TROPONINI in the last 168 hours. BNP (last 3 results) No results for input(s): PROBNP in the last 8760 hours. HbA1C:  Recent Labs  02/05/17 1844  HGBA1C 6.2*   CBG:  Recent Labs Lab 02/07/17 1635 02/07/17 1831 02/07/17 2126 02/08/17 0808 02/08/17 1200  GLUCAP 130* 147* 98 106* 135*   Lipid Profile: No results for input(s): CHOL, HDL, LDLCALC, TRIG, CHOLHDL, LDLDIRECT in the last 72 hours. Thyroid Function Tests: No results for input(s): TSH, T4TOTAL, FREET4, T3FREE, THYROIDAB in the last 72 hours. Anemia Panel: No results for input(s): VITAMINB12, FOLATE, FERRITIN, TIBC, IRON, RETICCTPCT in the last 72 hours. Urine analysis:    Component Value Date/Time   COLORURINE AMBER (A) 02/08/2017 1138   APPEARANCEUR TURBID (A) 02/08/2017 1138   LABSPEC 1.014 02/08/2017 1138   PHURINE 5.0 02/08/2017 1138   GLUCOSEU NEGATIVE 02/08/2017 1138   HGBUR MODERATE (A) 02/08/2017 1138   BILIRUBINUR NEGATIVE 02/08/2017 1138   KETONESUR NEGATIVE 02/08/2017 1138   PROTEINUR 100 (A) 02/08/2017 1138   UROBILINOGEN 0.2 09/02/2014 1504   NITRITE NEGATIVE 02/08/2017 1138   LEUKOCYTESUR LARGE (A) 02/08/2017 1138   Sepsis  Labs: @LABRCNTIP (procalcitonin:4,lacticidven:4)  ) Recent Results (from the past 240 hour(s))  Body Fluid Culture     Status: None   Collection Time: 02/03/17  4:34 PM  Result Value Ref Range Status   Gram Stain Moderate  Final   Gram Stain WBC present-predominately PMN  Final   Gram Stain No Organisms Seen  Final   Organism ID, Bacteria NO GROWTH  Final  Gram stain     Status: None   Collection Time: 02/03/17  4:34 PM  Result Value Ref Range Status   Gram Stain Moderate  Final   Gram Stain WBC present-predominately PMN  Final   Gram Stain No Organisms Seen  Final  Surgical pcr screen     Status: None   Collection Time: 02/05/17  9:20 PM  Result Value Ref Range Status   MRSA, PCR NEGATIVE NEGATIVE Final   Staphylococcus aureus NEGATIVE NEGATIVE Final    Comment:        The Xpert SA Assay (FDA approved for NASAL specimens in patients over 75 years of age), is one component of a comprehensive surveillance program.  Test performance has been validated by [facility] for patients greater than or equal to 71 year old. It is not intended to diagnose infection nor to guide or monitor treatment.   Aerobic/Anaerobic Culture (surgical/deep wound)     Status: None (Preliminary result)   Collection Time: 02/06/17  4:22 PM  Result Value Ref Range Status   Specimen Description TISSUE LEFT KNEE  Final   Special Requests NONE  Final   Gram Stain   Final    MODERATE WBC PRESENT,BOTH PMN AND MONONUCLEAR NO ORGANISMS SEEN    Culture   Final    NO GROWTH 2 DAYS NO ANAEROBES ISOLATED; CULTURE IN PROGRESS FOR 5 DAYS   Report Status PENDING  Incomplete  Aerobic/Anaerobic Culture (surgical/deep wound)     Status: None (Preliminary result)   Collection Time: 02/06/17  5:01 PM  Result Value Ref Range Status   Specimen Description TISSUE  Final   Special Requests LEFT FEMORAL CANNEL  Final   Gram Stain   Final    FEW WBC PRESENT, PREDOMINANTLY MONONUCLEAR NO ORGANISMS SEEN    Culture    Final    NO GROWTH 2 DAYS NO ANAEROBES ISOLATED; CULTURE IN PROGRESS FOR 5 DAYS   Report Status PENDING  Incomplete  Culture, blood (routine x 2)     Status: None (Preliminary result)   Collection Time: 02/07/17  8:30 AM  Result Value Ref Range Status   Specimen Description BLOOD RIGHT HAND  Final   Special Requests IN PEDIATRIC BOTTLE Blood Culture adequate volume  Final   Culture NO GROWTH 1 DAY  Final   Report Status PENDING  Incomplete  Culture, blood (routine x 2)     Status: None (Preliminary result)   Collection Time: 02/07/17  8:38 AM  Result Value Ref Range Status   Specimen Description BLOOD RIGHT ARM  Final   Special Requests IN PEDIATRIC BOTTLE Blood Culture adequate volume  Final   Culture NO GROWTH 1 DAY  Final   Report Status PENDING  Incomplete      Radiology Studies: Dg Chest 2 View  Result Date: 02/07/2017 CLINICAL DATA:  Cough EXAM: CHEST  2 VIEW COMPARISON:  09/21/2014 FINDINGS: Lungs are clear.  No pleural effusion or pneumothorax. Cardiomegaly. Mild degenerative changes of the visualized thoracolumbar spine. IMPRESSION: No evidence of acute cardiopulmonary disease. Electronically Signed   By: Julian Hy M.D.   On: 02/07/2017 15:17   Dg Knee Left Port  Result Date: 02/06/2017 CLINICAL DATA:  Total knee replacement status EXAM: PORTABLE LEFT KNEE - 1-2 VIEW COMPARISON:  02/08/2016 FINDINGS: Interval removal of left knee arthroplasty with placement of an antibiotic spacer and antibiotic beads. Associated soft tissue swelling and suprapatellar knee joint effusion. Vascular calcifications. IMPRESSION: Interval removal of left knee arthroplasty with placement of an antibiotic spacer and antibiotic beads. Electronically Signed   By: Julian Hy M.D.   On: 02/06/2017 21:50     Scheduled Meds: . calcitRIOL  0.25 mcg Oral Q M,W,F  . enoxaparin (LOVENOX) injection  30 mg Subcutaneous Daily  . ferrous sulfate  325 mg Oral BID WC  . gabapentin  100 mg Oral TID   . hydrALAZINE  25 mg Oral BID  . insulin aspart  0-9 Units Subcutaneous TID WC  . insulin glargine  15 Units Subcutaneous QAC breakfast  . mouth rinse  15 mL Mouth Rinse BID  . methocarbamol  500 mg Oral BID  . multivitamin with minerals  1 tablet Oral Daily  . nicotine  14 mg Transdermal Daily  . pantoprazole  40 mg Oral Daily  . senna-docusate  2 tablet Oral QHS  . sertraline  100 mg Oral Daily   Continuous Infusions: . sodium chloride 75 mL/hr at 02/08/17 0505  . [START ON 02/10/2017] DAPTOmycin (CUBICIN)  IV       LOS: 3 days   Time Spent in minutes   35 minutes  Fardeen Steinberger D.O. on 02/08/2017 at 3:11 PM  Between 7am to 7pm - Pager - [phone]  After 7pm go to [email or web address] - password [email or web address]  And look for the night coverage person covering for me after hours  [facility] Office  [phone]

## 2017-02-08 NOTE — Progress Notes (Signed)
   Subjective:  Transferred to floor yesterday.  Doing well.  Objective:   VITALS:   Vitals:   02/07/17 1828 02/07/17 2044 02/08/17 0547 02/08/17 0702  BP: (!) 141/59 (!) 114/50 137/62   Pulse: 93 84 97   Resp:  17 17   Temp: (!) 101.3 F (38.5 C) 100.1 F (37.8 C) (!) 101.7 F (38.7 C) 98.7 F (37.1 C)  TempSrc: Oral Oral Oral Oral  SpO2: 99% 99% 100%   Weight:   71.1 kg (156 lb 11.2 oz)   Height:        Neurologically intact Neurovascular intact Sensation intact distally Intact pulses distally Dorsiflexion/Plantar flexion intact Incision: dressing C/D/I and no drainage No cellulitis present Compartment soft   Lab Results  Component Value Date   WBC 12.2 (H) 02/08/2017   HGB 8.5 (L) 02/08/2017   HCT 26.0 (L) 02/08/2017   MCV 87.2 02/08/2017   PLT 375 02/08/2017     Assessment/Plan:  2 Days Post-Op   - Expected postop acute blood loss anemia - will monitor for symptoms - TDWB LLE in KI - dressings changed this morning - follow cultures - appreciate ID, renal, hospitalist assistance  Eduard Roux 02/08/2017, 11:18 AM [phone]

## 2017-02-08 NOTE — Progress Notes (Signed)
[name], female   DOB: [birth year], 61 y.o.   MRN: [id number]          [facility] for Infectious Disease  Date of Admission:  02/05/2017   Total days of antibiotics 3        Day 1 daptomycin         ASSESSMENT: So far her operative cultures from her left knee are negative. I do not have the results of the aspirate cultures done in the office on 02/03/2017. Blood cultures are negative as well. I do not know the cause of last night's fevers. If they persist I will obtain blood and urine cultures.  PLAN: 1. Continue daptomycin pending final cultures 2. Monitor temp curve  Active Problems:   Infection of prosthetic left knee joint ([name])   Fever   Peripheral neuropathy   Hepatitis C   Chronic kidney disease (CKD), stage IV (severe) ([name])   Chronic diastolic CHF (congestive heart failure) ([name])   Diabetic peripheral neuropathy ([name])   Depression   Anemia of chronic disease   Diabetes mellitus, type II ([name])   AKI (acute kidney injury) ([name])   Hemarthrosis   . calcitRIOL  0.25 mcg Oral Q M,W,F  . enoxaparin (LOVENOX) injection  30 mg Subcutaneous Daily  . ferrous sulfate  325 mg Oral BID WC  . gabapentin  100 mg Oral TID  . hydrALAZINE  25 mg Oral BID  . insulin aspart  0-9 Units Subcutaneous TID WC  . insulin glargine  15 Units Subcutaneous QAC breakfast  . mouth rinse  15 mL Mouth Rinse BID  . methocarbamol  500 mg Oral BID  . multivitamin with minerals  1 tablet Oral Daily  . nicotine  14 mg Transdermal Daily  . pantoprazole  40 mg Oral Daily  . senna-docusate  2 tablet Oral QHS  . sertraline  100 mg Oral Daily    SUBJECTIVE: She is having more pain in her left knee today. She was aware of having fever last night and had a drenching sweat. She had not been having fever or sweats at home before surgery.She has a little bit of itching that she suspects is due to pain medications.She has her baseline smoker's cough. She has not had any  diarrhea.  Review of Systems: Review of Systems  Constitutional: Positive for chills, diaphoresis and fever.  Respiratory: Positive for cough. Negative for sputum production and shortness of breath.   Cardiovascular: Negative for chest pain.  Gastrointestinal: Negative for abdominal pain, diarrhea, nausea and vomiting.  Genitourinary:       She has a Foley catheter.  Musculoskeletal: Positive for joint pain.  Skin: Positive for itching. Negative for rash.    Allergies  Allergen Reactions  . Compazine [Prochlorperazine] Shortness Of Breath and Swelling    TONGUE SWELLS  . Shellfish-Derived Products Anaphylaxis  . Iodinated Diagnostic Agents Hives and Rash  . Omnipaque [Iohexol] Hives    OBJECTIVE: Vitals:   02/07/17 1828 02/07/17 2044 02/08/17 0547 02/08/17 0702  BP: (!) 141/59 (!) 114/50 137/62   Pulse: 93 84 97   Resp:  17 17   Temp: (!) 101.3 F (38.5 C) 100.1 F (37.8 C) (!) 101.7 F (38.7 C) 98.7 F (37.1 C)  TempSrc: Oral Oral Oral Oral  SpO2: 99% 99% 100%   Weight:   156 lb 11.2 oz (71.1 kg)   Height:       Body mass index is 24.54 kg/m.  Physical Exam  Constitutional:  She is oriented to person, place, and time.  She has a little uncomfortable due to pain but otherwise in good spirits.  Cardiovascular: Normal rate and regular rhythm.   Murmur heard. Early 2/6 systolic murmur.  Pulmonary/Chest: Effort normal and breath sounds normal. She has no wheezes. She has no rales.  Abdominal: Soft. She exhibits no distension. There is no tenderness.  Musculoskeletal:  She has a clean dressing over her left knee incision. There is no drain there is no unusual redness of her left leg.  Neurological: She is alert and oriented to person, place, and time.  Skin: No rash noted.  Right arm IV site looks good.  Psychiatric: Mood and affect normal.    Lab Results Lab Results  Component Value Date   WBC 12.2 (H) 02/08/2017   HGB 8.5 (L) 02/08/2017   HCT 26.0 (L)  02/08/2017   MCV 87.2 02/08/2017   PLT 375 02/08/2017    Lab Results  Component Value Date   CREATININE 3.74 (H) 02/08/2017   BUN 73 (H) 02/08/2017   NA 139 02/08/2017   K 4.4 02/08/2017   CL 105 02/08/2017   CO2 22 02/08/2017    Lab Results  Component Value Date   ALT 20 02/05/2017   AST 16 02/05/2017   ALKPHOS 117 02/05/2017   BILITOT 0.3 02/05/2017     Microbiology: Recent Results (from the past 240 hour(s))  Body Fluid Culture     Status: None   Collection Time: 02/03/17  4:34 PM  Result Value Ref Range Status   Gram Stain Moderate  Final   Gram Stain WBC present-predominately PMN  Final   Gram Stain No Organisms Seen  Final   Organism ID, Bacteria NO GROWTH  Final  Gram stain     Status: None   Collection Time: 02/03/17  4:34 PM  Result Value Ref Range Status   Gram Stain Moderate  Final   Gram Stain WBC present-predominately PMN  Final   Gram Stain No Organisms Seen  Final  Surgical pcr screen     Status: None   Collection Time: 02/05/17  9:20 PM  Result Value Ref Range Status   MRSA, PCR NEGATIVE NEGATIVE Final   Staphylococcus aureus NEGATIVE NEGATIVE Final    Comment:        The Xpert SA Assay (FDA approved for NASAL specimens in patients over 60 years of age), is one component of a comprehensive surveillance program.  Test performance has been validated by [facility] for patients greater than or equal to 6 year old. It is not intended to diagnose infection nor to guide or monitor treatment.   Aerobic/Anaerobic Culture (surgical/deep wound)     Status: None (Preliminary result)   Collection Time: 02/06/17  4:22 PM  Result Value Ref Range Status   Specimen Description TISSUE LEFT KNEE  Final   Special Requests NONE  Final   Gram Stain   Final    MODERATE WBC PRESENT,BOTH PMN AND MONONUCLEAR NO ORGANISMS SEEN    Culture NO GROWTH 1 DAY  Final   Report Status PENDING  Incomplete  Aerobic/Anaerobic Culture (surgical/deep wound)     Status: None  (Preliminary result)   Collection Time: 02/06/17  5:01 PM  Result Value Ref Range Status   Specimen Description TISSUE  Final   Special Requests LEFT FEMORAL CANNEL  Final   Gram Stain   Final    FEW WBC PRESENT, PREDOMINANTLY MONONUCLEAR NO ORGANISMS SEEN    Culture NO  GROWTH 1 DAY  Final   Report Status PENDING  Incomplete  Culture, blood (routine x 2)     Status: None (Preliminary result)   Collection Time: 02/07/17  8:30 AM  Result Value Ref Range Status   Specimen Description BLOOD RIGHT HAND  Final   Special Requests IN PEDIATRIC BOTTLE Blood Culture adequate volume  Final   Culture NO GROWTH 1 DAY  Final   Report Status PENDING  Incomplete  Culture, blood (routine x 2)     Status: None (Preliminary result)   Collection Time: 02/07/17  8:38 AM  Result Value Ref Range Status   Specimen Description BLOOD RIGHT ARM  Final   Special Requests IN PEDIATRIC BOTTLE Blood Culture adequate volume  Final   Culture NO GROWTH 1 DAY  Final   Report Status PENDING  Incomplete    Michel Bickers, MD [facility] [phone] pager   [phone] cell 02/08/2017, 12:29 PM

## 2017-02-08 NOTE — Anesthesia Postprocedure Evaluation (Signed)
Anesthesia Post Note  Patient: Brandy Houston  Procedure(s) Performed: Procedure(s) (LRB): Incisional total iknee with antibiotic spacer  (Left)     Patient location during evaluation: PACU Anesthesia Type: General Level of consciousness: awake and alert Pain management: pain level controlled Vital Signs Assessment: post-procedure vital signs reviewed and stable Respiratory status: spontaneous breathing, nonlabored ventilation, respiratory function stable and patient connected to nasal cannula oxygen Cardiovascular status: blood pressure returned to baseline and stable Postop Assessment: no signs of nausea or vomiting Anesthetic complications: no    Last Vitals:  Vitals:   02/08/17 0702 02/08/17 1300  BP:  (!) 118/54  Pulse:  93  Resp:  16  Temp: 37.1 C 36.9 C  SpO2:  95%    Last Pain:  Vitals:   02/08/17 1353  TempSrc:   PainSc: 4                  Danija Gosa P Lorissa Kishbaugh

## 2017-02-08 NOTE — Progress Notes (Signed)
[facility] Progress Note  Subjective: no c/o's. Pain L knee. BP's up today.   Vitals:   02/07/17 1828 02/07/17 2044 02/08/17 0547 02/08/17 0702  BP: (!) 141/59 (!) 114/50 137/62   Pulse: 93 84 97   Resp:  17 17   Temp: (!) 101.3 F (38.5 C) 100.1 F (37.8 C) (!) 101.7 F (38.7 C) 98.7 F (37.1 C)  TempSrc: Oral Oral Oral Oral  SpO2: 99% 99% 100%   Weight:   71.1 kg (156 lb 11.2 oz)   Height:        Inpatient medications: . calcitRIOL  0.25 mcg Oral Q M,W,F  . enoxaparin (LOVENOX) injection  30 mg Subcutaneous Daily  . ferrous sulfate  325 mg Oral BID WC  . gabapentin  100 mg Oral TID  . hydrALAZINE  25 mg Oral BID  . insulin aspart  0-9 Units Subcutaneous TID WC  . insulin glargine  15 Units Subcutaneous QAC breakfast  . mouth rinse  15 mL Mouth Rinse BID  . methocarbamol  500 mg Oral BID  . multivitamin with minerals  1 tablet Oral Daily  . nicotine  14 mg Transdermal Daily  . pantoprazole  40 mg Oral Daily  . senna-docusate  2 tablet Oral QHS  . sertraline  100 mg Oral Daily   . sodium chloride 75 mL/hr at 02/08/17 0505  . DAPTOmycin (CUBICIN)  IV     acetaminophen **OR** acetaminophen, acetaminophen, calcium carbonate, diphenhydrAMINE, morphine injection, ondansetron **OR** ondansetron (ZOFRAN) IV, oxyCODONE, promethazine, traMADol  Exam: Gen alert, no distress, calm No jvd or bruits Chest clear bilat RRR 2/6 SEM, no RG Abd soft ntnd no mass or ascites +bs MS left leg in a brace Ext no RLE edema / no wounds or ulcers Neuro is alert, Ox 3 , nf    Home meds:  -norvasc/ lasix 160 bid/ zaroxolyn 2.5 qd/ toprol-xl 50 bid/ hydralazine 100 bid -zoloft/ ultram prn/ robaxin prn/ neurontin 100 bid -asa/ tums/ lantus 15 qam/ PPI/ KCL/ MVI/ vit D    Impression: 1.  Acute on CKD 4 - baseline creat 2.5, f/b Dr [name] at [facility].  Creat 3.6 now. Suspect vol depletion and /or hypotension/ sepsis. Less likely GN, will get UA.  Cont IVF"s. Decreased BP  meds, let BP come up. Creat in am. 2.  HTN - as above, cont hydralazine only 25 bid 3.  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 Vanc changed to Cubicin.  4.  DM2  5.  Volume - no excess  Plan - as above   Kelly Splinter MD [facility] pager [phone]   02/08/2017, 9:40 AM    Recent Labs Lab 02/06/17 0815 02/07/17 0238 02/08/17 0337  NA 137 138 139  K 3.6 4.4 4.4  CL 103 106 105  CO2 21* 21* 22  GLUCOSE 88 167* 101*  BUN 77* 72* 73*  CREATININE 3.66* 3.67* 3.74*  CALCIUM 8.8* 8.0* 8.2*    Recent Labs Lab 02/05/17 1844  AST 16  ALT 20  ALKPHOS 117  BILITOT 0.3  PROT 7.8  ALBUMIN 2.6*    Recent Labs Lab 02/03/17 1635  02/05/17 1844 02/06/17 0815 02/07/17 0238 02/08/17 0337  WBC 12.4*  < > 10.3 8.1 15.0* 12.2*  NEUTROABS 9.3*  --  7.7  --   --   --   HGB 7.7*  --  7.1* 8.8* 9.5* 8.5*  HCT 24.9*  < > 22.1* 26.8* 28.9* 26.0*  MCV  92  < > 84.7 85.4 84.5 87.2  PLT  --   < > 521* 464* 391 375  < > = values in this interval not displayed. Iron/TIBC/Ferritin/ %Sat    Component Value Date/Time   IRON 18 (L) 02/02/2017 1211   TIBC 237 (L) 02/02/2017 1211   FERRITIN 52 02/02/2017 1211   IRONPCTSAT 8 (L) 02/02/2017 1211

## 2017-02-09 ENCOUNTER — Encounter (HOSPITAL_COMMUNITY): Payer: Self-pay | Admitting: Orthopaedic Surgery

## 2017-02-09 LAB — BPAM RBC
Blood Product Expiration Date: 201808312359
Blood Product Expiration Date: 201809152359
Blood Product Expiration Date: 201809152359
Blood Product Expiration Date: 201809152359
Blood Product Expiration Date: 201809172359
Blood Product Expiration Date: 201809192359
Blood Product Expiration Date: 201809192359
ISSUE DATE / TIME: 201808232237
ISSUE DATE / TIME: 201808240149
ISSUE DATE / TIME: 201808241224
ISSUE DATE / TIME: 201808241555
ISSUE DATE / TIME: 201808241555
Unit Type and Rh: 6200
Unit Type and Rh: 6200
Unit Type and Rh: 6200
Unit Type and Rh: 6200
Unit Type and Rh: 6200
Unit Type and Rh: 6200
Unit Type and Rh: 6200

## 2017-02-09 LAB — TYPE AND SCREEN
ABO/RH(D): A POS
Antibody Screen: NEGATIVE
Unit division: 0
Unit division: 0
Unit division: 0
Unit division: 0
Unit division: 0
Unit division: 0
Unit division: 0

## 2017-02-09 LAB — CBC
HCT: 24 % — ABNORMAL LOW (ref 36.0–46.0)
Hemoglobin: 7.7 g/dL — ABNORMAL LOW (ref 12.0–15.0)
MCH: 28.3 pg (ref 26.0–34.0)
MCHC: 32.1 g/dL (ref 30.0–36.0)
MCV: 88.2 fL (ref 78.0–100.0)
Platelets: 357 10*3/uL (ref 150–400)
RBC: 2.72 MIL/uL — ABNORMAL LOW (ref 3.87–5.11)
RDW: 15.8 % — ABNORMAL HIGH (ref 11.5–15.5)
WBC: 12 10*3/uL — ABNORMAL HIGH (ref 4.0–10.5)

## 2017-02-09 LAB — BASIC METABOLIC PANEL
Anion gap: 10 (ref 5–15)
BUN: 74 mg/dL — ABNORMAL HIGH (ref 6–20)
CO2: 20 mmol/L — ABNORMAL LOW (ref 22–32)
Calcium: 8.2 mg/dL — ABNORMAL LOW (ref 8.9–10.3)
Chloride: 105 mmol/L (ref 101–111)
Creatinine, Ser: 3.88 mg/dL — ABNORMAL HIGH (ref 0.44–1.00)
GFR calc Af Amer: 13 mL/min — ABNORMAL LOW (ref >60)
GFR calc non Af Amer: 11 mL/min — ABNORMAL LOW (ref >60)
Glucose, Bld: 116 mg/dL — ABNORMAL HIGH (ref 65–99)
Potassium: 4.3 mmol/L (ref 3.5–5.1)
Sodium: 135 mmol/L (ref 135–145)

## 2017-02-09 LAB — GLUCOSE, CAPILLARY
Glucose-Capillary: 92 mg/dL (ref 65–99)
Glucose-Capillary: 100 mg/dL — ABNORMAL HIGH (ref 65–99)
Glucose-Capillary: 119 mg/dL — ABNORMAL HIGH (ref 65–99)
Glucose-Capillary: 105 mg/dL — ABNORMAL HIGH (ref 65–99)

## 2017-02-09 LAB — PREPARE RBC (CROSSMATCH)

## 2017-02-09 MED ORDER — FUROSEMIDE 10 MG/ML IJ SOLN
20.0000 mg | Freq: Once | INTRAMUSCULAR | Status: AC
  Administered 2017-02-09: 20 mg via INTRAVENOUS
  Filled 2017-02-09: qty 2

## 2017-02-09 MED ORDER — SODIUM CHLORIDE 0.9 % IV SOLN
Freq: Once | INTRAVENOUS | Status: AC
  Administered 2017-02-09: 13:00:00 via INTRAVENOUS

## 2017-02-09 MED ORDER — CIPROFLOXACIN HCL 250 MG PO TABS
250.0000 mg | ORAL_TABLET | Freq: Every day | ORAL | Status: DC
  Administered 2017-02-09 – 2017-02-17 (×10): 250 mg via ORAL
  Filled 2017-02-09 (×10): qty 1

## 2017-02-09 NOTE — Evaluation (Signed)
Occupational Therapy Evaluation Patient Details Name: Brandy Houston MRN: [id number] DOB: 1956/06/12 Today's Date: 02/09/2017    History of Present Illness 61 y.o. 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 Presented to the hospital from Dr. [name] office (ortho) status post aspiration with elevated WBC. ADmitted with septic knee. 8/24 Pt s/p Lt TKA excision arthroplasty with spacer placed   Clinical Impression   This 61 y/o F presents with the above. Pt with inconsistent report as to PLOF, per chart review Pt was receiving assistance from family for ADL completion, was using w/c for functional mobility and limited ambulation for short distances. Pt currently requires minA for stand pivot transfer using RW, maintaining TDWB in LLE, MaxA for LB ADLs. Pt will benefit from continued acute OT services and recommend additional [facility] services after discharge to maximize Pt's safety and independence with ADLs and functional mobility.     Follow Up Recommendations  Home health OT;Supervision/Assistance - 24 hour    Equipment Recommendations  None recommended by OT           Precautions / Restrictions Precautions Precautions: Fall Restrictions Weight Bearing Restrictions: Yes LLE Weight Bearing: Touchdown weight bearing      Mobility Bed Mobility Overal bed mobility: Needs Assistance Bed Mobility: Sit to Supine       Sit to supine: Min assist   General bed mobility comments: MinA for LLE management   Transfers Overall transfer level: Needs assistance   Transfers: Sit to/from Stand;Stand Pivot Transfers Sit to Stand: Min assist Stand pivot transfers: Min assist       General transfer comment: cues for hand placement; assist to rise and steady; Pt taking small pivotal steps (vs hopping) on RLE to pivot to EOB from recliner (transferring to R side)     Balance Overall balance  assessment: Needs assistance   Sitting balance-Leahy Scale: Good     Standing balance support: Bilateral upper extremity supported Standing balance-Leahy Scale: Poor Standing balance comment: reliant on UE support                            ADL either performed or assessed with clinical judgement   ADL Overall ADL's : Needs assistance/impaired Eating/Feeding: Set up;Sitting   Grooming: Set up;Sitting   Upper Body Bathing: Sitting;Min guard   Lower Body Bathing: Moderate assistance;Sitting/lateral leans   Upper Body Dressing : Min guard;Sitting   Lower Body Dressing: Maximal assistance;Sitting/lateral leans   Toilet Transfer: Minimal assistance;Stand-pivot;BSC;RW Toilet Transfer Details (indicate cue type and reason): simulated through chair to EOB transfer  [place] and Hygiene: Moderate assistance;Sit to/from stand       Functional mobility during ADLs: Minimal assistance;Rolling walker                           Pertinent Vitals/Pain Pain Assessment: Faces Faces Pain Scale: Hurts little more Pain Location: left knee Pain Descriptors / Indicators: Sore Pain Intervention(s): Monitored during session;Repositioned     Hand Dominance     Extremity/Trunk Assessment Upper Extremity Assessment Upper Extremity Assessment: Generalized weakness   Lower Extremity Assessment Lower Extremity Assessment: Defer to PT evaluation   Cervical / Trunk Assessment Cervical / Trunk Assessment: Normal   Communication Communication Communication: No difficulties   Cognition Arousal/Alertness: Awake/alert;Lethargic (initially lethargic, more alert as session progresed ) Behavior During Therapy: Flat affect Overall Cognitive  Status: No family/caregiver present to determine baseline cognitive functioning                                 General Comments: pt with varied level of assist reported when attempting to obtain [place] expects to be discharged to:: Private residence Living Arrangements: Children Available Help at Discharge: Family;Available PRN/intermittently (per chart review, children work but Pt only alone about an hr each day) Type of Home: House Home Access: Stairs to enter [employer] of Steps: 3 Entrance Stairs-Rails: Right Home Layout: Two level;Able to live on main level with bedroom/bathroom     Bathroom Shower/Tub: Occupational psychologist: Standard     Home Equipment: Environmental consultant - 2 wheels;Wheelchair - manual;Bedside commode;Cane - single point;Walker - 4 wheels;Shower seat   Additional Comments: Pt with increased lethargy initially at start of session, PLOF obtained through chart review as Pt providing inconsistent responses       Prior Functioning/Environment Level of Independence: Needs assistance  Gait / Transfers Assistance Needed: pt walked very limited distance with mainly WC mobility in home with limited ambulation short distances for transfer to shower. son typically bumps pt up stairs in her WC ADL's / Homemaking Assistance Needed: family does the homemaking; Pt reports family was assisting with ADLs             OT Problem List: Decreased strength;Impaired balance (sitting and/or standing);Decreased activity tolerance;Decreased knowledge of use of DME or AE      OT Treatment/Interventions: Self-care/ADL training;DME and/or AE instruction;Therapeutic activities;Balance training;Therapeutic exercise;Energy conservation;Patient/family education    OT Goals(Current goals can be found in the care plan section) Acute Rehab OT Goals Patient Stated Goal: return home OT Goal Formulation: With patient Time For Goal Achievement: 02/23/17 Potential to Achieve Goals: Good  OT Frequency: Min 2X/week                             AM-PAC PT "6 Clicks" Daily Activity     Outcome Measure Help from another  person eating meals?: None Help from another person taking care of personal grooming?: A Little Help from another person toileting, which includes using toliet, bedpan, or urinal?: A Lot Help from another person bathing (including washing, rinsing, drying)?: A Lot Help from another person to put on and taking off regular upper body clothing?: A Little Help from another person to put on and taking off regular lower body clothing?: A Lot 6 Click Score: 16   End of Session Equipment Utilized During Treatment: Gait belt;Rolling walker Nurse Communication: Mobility status  Activity Tolerance: Patient tolerated treatment well Patient left: in bed;with call bell/phone within reach;with bed alarm set;with nursing/sitter in room (case manager )  OT Visit Diagnosis: Unsteadiness on feet (R26.81);Muscle weakness (generalized) (M62.81)                Time: 2751-7001 OT Time Calculation (min): 23 min Charges:  OT General Charges $OT Visit: 1 Visit OT Evaluation $OT Eval Low Complexity: 1 Low G-Codes:     Lou Cal, OT Pager [phone] 02/09/2017   Raymondo Band 02/09/2017, 4:45 PM

## 2017-02-09 NOTE — Care Management Important Message (Signed)
Important Message  Patient Details  Name: Brandy Houston MRN: [id number] Date of Birth: 12-Dec-1955   Medicare Important Message Given:  Yes    [name] 02/09/2017, 2:11 PM

## 2017-02-09 NOTE — Progress Notes (Signed)
Patient ID: [name], female   DOB: [birth year], 61 y.o.   MRN: [id number] [facility] Progress Note   Assessment/ Plan:   1. 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 Anti-hypertensive therapy adjusted to allow for improved renal perfusion and status post IV fluids. Overnight, renal function essentially unchanged with fair urine output. No acute electrolyte abnormality or acute indications for hemodialysis. 2. Septic prosthetic left knee: Status post excision of prosthesis with antibiotic impregnated spacer. On Cubicin. 3. Hypertension: Antihypertensive therapy reduced to allow for improved renal perfusion-on monotherapy with hydralazine. 4. Anemia: likely from acute illness/postoperative inflammatory complex/resistance. Getting PRBC transfusion at this time.  Subjective:   Reports to be having a lot of pain from her left knee. Denies any chest pain or shortness of breath.   Objective:   BP (!) 130/54   Pulse 88   Temp 98.8 F (37.1 C) (Oral)   Resp 18   Ht 5\' 7"  (1.702 m)   Wt 71.4 kg (157 lb 4.8 oz)   LMP 03/16/2002   SpO2 95%   BMI 24.64 kg/m   Intake/Output Summary (Last 24 hours) at 02/09/17 1337 Last data filed at 02/09/17 1317  Gross per 24 hour  Intake           2417.5 ml  Output              700 ml  Net           1717.5 ml   Weight change: 0.272 kg (9.6 oz)  Physical Exam: QAS:TMHDQQI to be comfortable sitting up in recliner eating her lunch WLN:LGXQJ regular rhythm, normal rate, S1 and S2 normal Resp:clear to auscultation, no rales/rhonchi JHE:RDEY, flat, nontender CXK:GYJE leg in supportive soft dressing, right leg no edema  Imaging: Dg Chest 2 View  Result Date: 02/07/2017 CLINICAL DATA:  Cough EXAM: CHEST  2  VIEW COMPARISON:  09/21/2014 FINDINGS: Lungs are clear.  No pleural effusion or pneumothorax. Cardiomegaly. Mild degenerative changes of the visualized thoracolumbar spine. IMPRESSION: No evidence of acute cardiopulmonary disease. Electronically Signed   By: Julian Hy M.D.   On: 02/07/2017 15:17    Labs: BMET  Recent Labs Lab 02/05/17 1844 02/06/17 0815 02/06/17 1754 02/07/17 0238 02/08/17 0337 02/09/17 0308  NA 134* 137 140 138 139 135  K 3.4* 3.6 3.7 4.4 4.4 4.3  CL 101 103  --  106 105 105  CO2 21* 21*  --  21* 22 20*  GLUCOSE 180* 88 135* 167* 101* 116*  BUN 79* 77*  --  72* 73* 74*  CREATININE 3.87* 3.66*  --  3.67* 3.74* 3.88*  CALCIUM 8.7* 8.8*  --  8.0* 8.2* 8.2*   CBC  Recent Labs Lab 02/03/17 1635  02/05/17 1844 02/06/17 0815 02/06/17 1754 02/07/17 0238 02/08/17 0337 02/09/17 0308  WBC 12.4*  < > 10.3 8.1  --  15.0* 12.2* 12.0*  NEUTROABS 9.3*  --  7.7  --   --   --   --   --   HGB 7.7*  < > 7.1* 8.8* 8.8* 9.5* 8.5* 7.7*  HCT 24.9*  < > 22.1* 26.8* 26.0* 28.9* 26.0* 24.0*  MCV 92  < > 84.7 85.4  --  84.5 87.2 88.2  PLT  --   < > 521* 464*  --  391  375 357  < > = values in this interval not displayed.  Medications:    . calcitRIOL  0.25 mcg Oral Q M,W,F  . enoxaparin (LOVENOX) injection  30 mg Subcutaneous Daily  . ferrous sulfate  325 mg Oral BID WC  . gabapentin  100 mg Oral TID  . hydrALAZINE  25 mg Oral BID  . insulin aspart  0-9 Units Subcutaneous TID WC  . insulin glargine  15 Units Subcutaneous QAC breakfast  . mouth rinse  15 mL Mouth Rinse BID  . methocarbamol  500 mg Oral BID  . multivitamin with minerals  1 tablet Oral Daily  . nicotine  14 mg Transdermal Daily  . pantoprazole  40 mg Oral Daily  . senna-docusate  2 tablet Oral QHS  . sertraline  100 mg Oral Daily   Elmarie Shiley, MD 02/09/2017, 1:37 PM

## 2017-02-09 NOTE — Evaluation (Addendum)
Physical Therapy Evaluation Patient Details Name: Brandy Houston MRN: [id number] DOB: 01/04/1956 Today's Date: 02/09/2017   History of Present Illness  61 y.o. 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 Presented to the hospital from Dr. [name] office (ortho) status post aspiration with elevated WBC. ADmitted with septic knee. 8/24 Pt s/p Lt TKA excision arthroplasty with spacer placed  Clinical Impression  Pt pleasant with limited mobility at baseline. Pt reports she spent a couple months in SNF after her TKA in 2017 and never really returned to walking. She utilizes WC at home with assist from family. Pt with decreased, ROM, strength, mobility, function and gait who will benefit from acute therapy to maximize mobility, function and strength to decrease burden of care.    Follow Up Recommendations Home health PT;Supervision/Assistance - 24 hour    Equipment Recommendations  None recommended by PT    Recommendations for Other Services       Precautions / Restrictions        Mobility  Bed Mobility Overal bed mobility: Needs Assistance Bed Mobility: Supine to Sit     Supine to sit: Min assist     General bed mobility comments: min assist with increased time and rail to pivot left  Transfers Overall transfer level: Needs assistance   Transfers: Sit to/from Stand Sit to Stand: Min guard         General transfer comment: cues for hand placement and safety from bed x 2 trials  Ambulation/Gait Ambulation/Gait assistance: Min guard Ambulation Distance (Feet): 3 Feet Assistive device: Rolling walker (2 wheeled) Gait Pattern/deviations: Step-to pattern   Gait velocity interpretation: Below normal speed for age/gender General Gait Details: cues for sequence and safety with pt able to hop bed to chair limited distance with TDWB maintained  Stairs            Wheelchair  Mobility    Modified Rankin (Stroke Patients Only)       Balance Overall balance assessment: Needs assistance   Sitting balance-Leahy Scale: Good       Standing balance-Leahy Scale: Fair                               Pertinent Vitals/Pain Pain Assessment: 0-10 Pain Score: 6  Pain Location: left knee Pain Descriptors / Indicators: Sore Pain Intervention(s): Limited activity within patient's tolerance;Repositioned;Monitored during session;Premedicated before session    Home Living Family/patient expects to be discharged to:: Private residence Living Arrangements: Children Available Help at Discharge: Family;Available PRN/intermittently (kids work but only alone about an hr a day) Type of Home: House Home Access: Stairs to enter Entrance Stairs-Rails: Right Entrance Stairs-Number of Steps: 3 Home Layout: Two level;Able to live on main level with bedroom/bathroom Home Equipment: Gilford Rile - 2 wheels;Wheelchair - manual;Bedside commode;Cane - single point;Walker - 4 wheels;Shower seat      Prior Function Level of Independence: Needs assistance   Gait / Transfers Assistance Needed: pt walked very limited distance with mainly WC mobility in home with limited ambulation short distances for transfer to shower. son typically bumps pt up stairs in her WC  ADL's / Homemaking Assistance Needed: family does the homemaking        Hand Dominance        Extremity/Trunk Assessment   Upper Extremity Assessment Upper Extremity Assessment: Generalized weakness    Lower Extremity Assessment Lower Extremity Assessment: Generalized  weakness (no ROM left knee secondary to lack of joint)    Cervical / Trunk Assessment Cervical / Trunk Assessment: Normal  Communication   Communication: No difficulties  Cognition Arousal/Alertness: Awake/alert Behavior During Therapy: Flat affect Overall Cognitive Status: No family/caregiver present to determine baseline cognitive  functioning                                 General Comments: pt with varied level of assist reported. Initially stating independence then with further questions reports she wasn't really walking and mainly uses WC      General Comments      Exercises     Assessment/Plan    PT Assessment Patient needs continued PT services  PT Problem List Decreased strength;Decreased mobility;Decreased safety awareness;Decreased range of motion;Decreased activity tolerance;Decreased knowledge of use of DME;Pain       PT Treatment Interventions Gait training;Therapeutic exercise;Patient/family education;Functional mobility training;DME instruction;Therapeutic activities    PT Goals (Current goals can be found in the Care Plan section)  Acute Rehab PT Goals Patient Stated Goal: return home PT Goal Formulation: With patient Time For Goal Achievement: 02/23/17 Potential to Achieve Goals: Fair    Frequency Min 3X/week   Barriers to discharge Decreased caregiver support son and daughter in law work but typically someone home 23 hrs/day    Co-evaluation               AM-PAC PT "6 Clicks" Daily Activity  Outcome Measure Difficulty turning over in bed (including adjusting bedclothes, sheets and blankets)?: A Little Difficulty moving from lying on back to sitting on the side of the bed? : A Lot Difficulty sitting down on and standing up from a chair with arms (e.g., wheelchair, bedside commode, etc,.)?: A Lot Help needed moving to and from a bed to chair (including a wheelchair)?: A Little Help needed walking in hospital room?: A Little Help needed climbing 3-5 steps with a railing? : Total 6 Click Score: 14    End of Session Equipment Utilized During Treatment: Gait belt;Left knee immobilizer Activity Tolerance: Patient tolerated treatment well Patient left: in chair;with call bell/phone within reach;with chair alarm set Nurse Communication: Mobility  status;Precautions;Weight bearing status PT Visit Diagnosis: Difficulty in walking, not elsewhere classified (R26.2);Muscle weakness (generalized) (M62.81);Other abnormalities of gait and mobility (R26.89);Pain Pain - Right/Left: Left Pain - part of body: Knee    Time: 1207-1230 PT Time Calculation (min) (ACUTE ONLY): 23 min   Charges:   PT Evaluation $PT Eval Moderate Complexity: 1 Mod     PT G Codes:        Elwyn Reach, PT [phone]   [place] B Gillermo Poch 02/09/2017, 12:43 PM

## 2017-02-09 NOTE — Progress Notes (Signed)
PROGRESS NOTE    Brandy Houston  UYQ:[id number] DOB: 30-Sep-1955 DOA: 02/05/2017 PCP: [name], MD   No chief complaint on file.  Brief Narrative:  HPI On 02/05/2017 Brandy Houston is a 61 y.o. female with a medical history of diastolic heart failure, chronic kidney disease, diabetes, hypertension, septic arthritis, presented to the hospital from Dr. [name] office (ortho). Patient presented to his office on 02/03/2017 for left knee pain, status post aspiration. Fluid showed greater than 58,000 WBC. Patient states over the past several months, she's been having problems walking and her knee has been popping out of place. Currently patient denies any chest pain, shortness breath, abdominal pain, nausea vomiting, diarrhea or constipation, dizziness or headache. She denies any recent trauma or falls. Denies any recent travel or ill contacts. Patient has been seeing a hematologist for hemarthrosis and has had several bloody knee effusions. Per documentation, unlikely to be coagulopathy. Patient has not been on any antibiotics recently.  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 [name] -s/p aspiration yielding 120 mL's of bloody fluid (on 02/03/2017) -Fluid shows 58,000 WBC. Gram stain showed no organisms -s/p left total knee excision arthroplasty and placement of articulating methylmethacrylate antibiotic spacer; debridement of bone, muscle and skin 15x12cm; wound repair left knee 15cm -PT/OT consulted -Continue pain control -Wound cultures show no growth to date -Blood cultures show no growth to date -Started on vancomycin, but switched to daptomycin given CKD -obtained CK level 77  Fever -Patient spiked fever overnight of 101.63F on 8/25. Tmax on 8/26 100F -Vitals currently stable, leukocytosis actually improving -Blood cultures show no growth to date, UA and urine culture as listed below -Continue to  monitor  Leukocytosis  -WBC improving today to 12.0 -likely reactive to recent surgery vs possible knee infection -Currently on daptomycin  -Blood cultures show no growth to date -CXR unremarkable for infection -UA TNTC WBC, large leukocyte, few bacteria (patient not complaining of any urinary symptoms) -Urine culture pending, patient has no complaints of urinary symptoms  -wound culture shows no growth to date -Continue to monitor CBC  Chronic diastolic heart failure -Echocardiogram February 2016 showed a grade 2 diastolic dysfunction -Currently patient appears to be euvolemic and compensated -Takes metolazone and Lasix at home- currently held due to AKI -Patient was given IV lasix between blood transfusions to avoid volume overload on admission -will give additional dose of lasix today as patient will receive [facility] -Monitor intake and output, daily weights -urine output over past 24hrs 700cc  Acute kidney injury vs Chronic kidney disease, stage IV -Patient follows with Dr. 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 Early evidence of diabetic glomerulopathy -Creatinine 3.88 (baseline 2.3-2.5)  -In July 2017, Creatinine was 3.7- ?new baseline  -Possibly due to anemia vs diuretics (given IV lasix between blood transfusions) -Nephrology consulted and appreciated and placed patient on gentle IVF- will hold for now as patient will be receiving 2uPRBCs  -Continue to monitor BMP  -Continue foley catheter for accurate monitoring of I/Os given that patient has received IV lasix and has worsening renal function  Diabetes mellitus, type II with neuropathy -Patient uses Lantus 15 units daily at home -Continue Lantus, insulin sliding scale was CBG monitoring -Hemoglobin A1c 6.2 -Continue gabapentin for neuropathy  Essential  hypertension -Continue hydralazine -metoprolol and  amlodipine discontinued to aid in perfusion  Depression -Continue Zoloft  Tobacco abuse -Smoking cessation discussed -Continue Nicotine patch  Anemia of chronic disease/ hemarthrosis/ Anemia due to blood loss -Hemoglobin was 7.1 on admission (baseline hemoglobin 9 over the past year) -Has had a history of hemarthrosis and was seeing oncology. Patient was receiving Aranesp injections every 30 days. -Patient denies any complaints of melena or hematochezia -Recently had anemia panel on 02/02/2017 which showed iron of 18, ferritin of 52 -Given 5uPRBC since admission, hemoglobin had only improved to 9.5 -today, Hemoglobin  7.7 -will tranfuse 2uPRBC -Continue iron supplementation  -Continue to monitor CBC  Hepatitis C -Per patient, stable -Continue outpatient follow up  DVT Prophylaxis  SCDs/lovenox  Code Status: Full  Family Communication: None at bedside  Disposition Plan: Admitted. Dispo pending  Consultants Orthopedic surgery, Dr. [name] Nephrology  Procedures  Left total knee excision arthroplasty and placement of articulating methylmethacrylate antibiotic spacer; debridement of bone, muscle and skin 15x12cm; wound repair left knee 15cm  Antibiotics   Anti-infectives    Start     Dose/Rate Route Frequency Ordered Stop   02/10/17 1052  DAPTOmycin (CUBICIN) 420 mg in sodium chloride 0.9 % IVPB     420 mg 216.8 mL/hr over 30 Minutes Intravenous Every 48 hours 02/08/17 1255     02/08/17 1600  vancomycin (VANCOCIN) IVPB 1000 mg/200 mL premix  Status:  Discontinued     1,000 mg 200 mL/hr over 60 Minutes Intravenous Every 48 hours 02/06/17 2214 02/07/17 1717   02/08/17 1000  DAPTOmycin (CUBICIN) 420 mg in sodium chloride 0.9 % IVPB  Status:  Discontinued     420 mg 216.8 mL/hr over 30 Minutes Intravenous Every 24 hours 02/07/17 1805 02/08/17 1255   02/06/17 1806  tobramycin (NEBCIN) powder  Status:  Discontinued       As  needed 02/06/17 1841 02/06/17 1900   02/06/17 1610  vancomycin (VANCOCIN) powder  Status:  Discontinued       As needed 02/06/17 1611 02/06/17 1900   02/06/17 1458  vancomycin (VANCOCIN) 1-5 GM/200ML-% IVPB    Comments:  Schonewitz, Leigh   : cabinet override      02/06/17 1458 02/07/17 0259      Subjective:   [name] seen and examined today.   Patient continues to complain of left knee pain. Denies cehst pain, shortness of breath, abdominal pain, N/V/D/C.  Objective:   Vitals:   02/08/17 2050 02/08/17 2235 02/09/17 0500 02/09/17 0501  BP: (!) 127/55 (!) 131/55  (!) 120/56  Pulse: 96 94  90  Resp: 16 16  16   Temp: 100 F (37.8 C) 99.2 F (37.3 C)  98.9 F (37.2 C)  TempSrc: Oral Oral  Oral  SpO2: 94% 99%  99%  Weight:   71.4 kg (157 lb 4.8 oz)   Height:        Intake/Output Summary (Last 24 hours) at 02/09/17 1038 Last data filed at 02/09/17 0522  Gross per 24 hour  Intake           2102.5 ml  Output              700 ml  Net           1402.5 ml   Filed Weights   02/07/17 0400 02/08/17 0547 02/09/17 0500  Weight: 69.5 kg (153 lb 3.5 oz) 71.1 kg (156 lb 11.2 oz) 71.4 kg (157 lb 4.8 oz)   Exam  General: Well developed, well nourished, NAD, appears  stated age  69: NCAT, mucous membranes moist.   Cardiovascular: S1 S2 auscultated,2/6 SEM, RRR  Respiratory:Diminished but clear, no wheezing  Abdomen: Soft, nontender, nondistended, + bowel sounds  Extremities: warm dry without cyanosis clubbing or edema of RLE. LLE with dressing/stocking in place- left knee edema, TTP  Neuro: AAOx3, nonfocal  Psych: Pleasant, appropriate  Data Reviewed: I have personally reviewed following labs and imaging studies  CBC:  Recent Labs Lab 02/03/17 1635  02/05/17 1844 02/06/17 0815 02/06/17 1754 02/07/17 0238 02/08/17 0337 02/09/17 0308  WBC 12.4*  --  10.3 8.1  --  15.0* 12.2* 12.0*  NEUTROABS 9.3*  --  7.7  --   --   --   --   --   HGB 7.7*  --  7.1* 8.8* 8.8*  9.5* 8.5* 7.7*  HCT 24.9*  < > 22.1* 26.8* 26.0* 28.9* 26.0* 24.0*  MCV 92  --  84.7 85.4  --  84.5 87.2 88.2  PLT  --   --  521* 464*  --  391 375 357  < > = values in this interval not displayed. Basic Metabolic Panel:  Recent Labs Lab 02/05/17 1844 02/06/17 0815 02/06/17 1754 02/07/17 0238 02/08/17 0337 02/09/17 0308  NA 134* 137 140 138 139 135  K 3.4* 3.6 3.7 4.4 4.4 4.3  CL 101 103  --  106 105 105  CO2 21* 21*  --  21* 22 20*  GLUCOSE 180* 88 135* 167* 101* 116*  BUN 79* 77*  --  72* 73* 74*  CREATININE 3.87* 3.66*  --  3.67* 3.74* 3.88*  CALCIUM 8.7* 8.8*  --  8.0* 8.2* 8.2*  MG 2.1  --   --   --   --   --    GFR: Estimated Creatinine Clearance: 14.6 mL/min (A) (by C-G formula based on SCr of 3.88 mg/dL (H)). Liver Function Tests:  Recent Labs Lab 02/05/17 1844  AST 16  ALT 20  ALKPHOS 117  BILITOT 0.3  PROT 7.8  ALBUMIN 2.6*   No results for input(s): LIPASE, AMYLASE in the last 168 hours. No results for input(s): AMMONIA in the last 168 hours. Coagulation Profile: No results for input(s): INR, PROTIME in the last 168 hours. Cardiac Enzymes:  Recent Labs Lab 02/08/17 1536  CKTOTAL 77   BNP (last 3 results) No results for input(s): PROBNP in the last 8760 hours. HbA1C: No results for input(s): HGBA1C in the last 72 hours. CBG:  Recent Labs Lab 02/08/17 0808 02/08/17 1200 02/08/17 1643 02/08/17 2050 02/09/17 0801  GLUCAP 106* 135* 105* 129* 92   Lipid Profile: No results for input(s): CHOL, HDL, LDLCALC, TRIG, CHOLHDL, LDLDIRECT in the last 72 hours. Thyroid Function Tests: No results for input(s): TSH, T4TOTAL, FREET4, T3FREE, THYROIDAB in the last 72 hours. Anemia Panel: No results for input(s): VITAMINB12, FOLATE, FERRITIN, TIBC, IRON, RETICCTPCT in the last 72 hours. Urine analysis:    Component Value Date/Time   COLORURINE AMBER (A) 02/08/2017 1138   APPEARANCEUR TURBID (A) 02/08/2017 1138   LABSPEC 1.014 02/08/2017 1138    PHURINE 5.0 02/08/2017 1138   GLUCOSEU NEGATIVE 02/08/2017 1138   HGBUR MODERATE (A) 02/08/2017 1138   BILIRUBINUR NEGATIVE 02/08/2017 1138   KETONESUR NEGATIVE 02/08/2017 1138   PROTEINUR 100 (A) 02/08/2017 1138   UROBILINOGEN 0.2 09/02/2014 1504   NITRITE NEGATIVE 02/08/2017 1138   LEUKOCYTESUR LARGE (A) 02/08/2017 1138   Sepsis Labs: @LABRCNTIP (procalcitonin:4,lacticidven:4)  ) Recent Results (from the past 240 hour(s))  Body  Fluid Culture     Status: None   Collection Time: 02/03/17  4:34 PM  Result Value Ref Range Status   Gram Stain Moderate  Final   Gram Stain WBC present-predominately PMN  Final   Gram Stain No Organisms Seen  Final   Organism ID, Bacteria NO GROWTH  Final  Gram stain     Status: None   Collection Time: 02/03/17  4:34 PM  Result Value Ref Range Status   Gram Stain Moderate  Final   Gram Stain WBC present-predominately PMN  Final   Gram Stain No Organisms Seen  Final  Surgical pcr screen     Status: None   Collection Time: 02/05/17  9:20 PM  Result Value Ref Range Status   MRSA, PCR NEGATIVE NEGATIVE Final   Staphylococcus aureus NEGATIVE NEGATIVE Final    Comment:        The Xpert SA Assay (FDA approved for NASAL specimens in patients over 92 years of age), is one component of a comprehensive surveillance program.  Test performance has been validated by [facility] for patients greater than or equal to 44 year old. It is not intended to diagnose infection nor to guide or monitor treatment.   Aerobic/Anaerobic Culture (surgical/deep wound)     Status: None (Preliminary result)   Collection Time: 02/06/17  4:22 PM  Result Value Ref Range Status   Specimen Description TISSUE LEFT KNEE  Final   Special Requests NONE  Final   Gram Stain   Final    MODERATE WBC PRESENT,BOTH PMN AND MONONUCLEAR NO ORGANISMS SEEN    Culture   Final    NO GROWTH 2 DAYS NO ANAEROBES ISOLATED; CULTURE IN PROGRESS FOR 5 DAYS   Report Status PENDING  Incomplete   Aerobic/Anaerobic Culture (surgical/deep wound)     Status: None (Preliminary result)   Collection Time: 02/06/17  5:01 PM  Result Value Ref Range Status   Specimen Description TISSUE  Final   Special Requests LEFT FEMORAL CANNEL  Final   Gram Stain   Final    FEW WBC PRESENT, PREDOMINANTLY MONONUCLEAR NO ORGANISMS SEEN    Culture   Final    NO GROWTH 2 DAYS NO ANAEROBES ISOLATED; CULTURE IN PROGRESS FOR 5 DAYS   Report Status PENDING  Incomplete  Culture, blood (routine x 2)     Status: None (Preliminary result)   Collection Time: 02/07/17  8:30 AM  Result Value Ref Range Status   Specimen Description BLOOD RIGHT HAND  Final   Special Requests IN PEDIATRIC BOTTLE Blood Culture adequate volume  Final   Culture NO GROWTH 1 DAY  Final   Report Status PENDING  Incomplete  Culture, blood (routine x 2)     Status: None (Preliminary result)   Collection Time: 02/07/17  8:38 AM  Result Value Ref Range Status   Specimen Description BLOOD RIGHT ARM  Final   Special Requests IN PEDIATRIC BOTTLE Blood Culture adequate volume  Final   Culture NO GROWTH 1 DAY  Final   Report Status PENDING  Incomplete      Radiology Studies: Dg Chest 2 View  Result Date: 02/07/2017 CLINICAL DATA:  Cough EXAM: CHEST  2 VIEW COMPARISON:  09/21/2014 FINDINGS: Lungs are clear.  No pleural effusion or pneumothorax. Cardiomegaly. Mild degenerative changes of the visualized thoracolumbar spine. IMPRESSION: No evidence of acute cardiopulmonary disease. Electronically Signed   By: Julian Hy M.D.   On: 02/07/2017 15:17     Scheduled Meds: .  calcitRIOL  0.25 mcg Oral Q M,W,F  . enoxaparin (LOVENOX) injection  30 mg Subcutaneous Daily  . ferrous sulfate  325 mg Oral BID WC  . furosemide  20 mg Intravenous Once  . gabapentin  100 mg Oral TID  . hydrALAZINE  25 mg Oral BID  . insulin aspart  0-9 Units Subcutaneous TID WC  . insulin glargine  15 Units Subcutaneous QAC breakfast  . mouth rinse  15 mL Mouth  Rinse BID  . methocarbamol  500 mg Oral BID  . multivitamin with minerals  1 tablet Oral Daily  . nicotine  14 mg Transdermal Daily  . pantoprazole  40 mg Oral Daily  . senna-docusate  2 tablet Oral QHS  . sertraline  100 mg Oral Daily   Continuous Infusions: . sodium chloride 75 mL/hr at 02/09/17 0505  . sodium chloride    . [START ON 02/10/2017] DAPTOmycin (CUBICIN)  IV       LOS: 4 days   Time Spent in minutes   35 minutes  Roslynn Holte D.O. on 02/09/2017 at 10:38 AM  Between 7am to 7pm - Pager - [phone]  After 7pm go to [email or web address] - password [email or web address]  And look for the night coverage person covering for me after hours  [facility] Office  [phone]

## 2017-02-09 NOTE — Progress Notes (Signed)
[place] pt for [facility] this hospital admission.  [facility] will provide HHRN , PT and OT as well as Home Infusion Pharmacy services for home IV ABX at DC.  [facility] will prepare for DC to support when ordered.  If patient discharges after hours, please call [phone].   [name] 02/09/2017, 4:26 PM

## 2017-02-09 NOTE — Care Management Note (Signed)
Case Management Note  Patient Details  Name: Brandy Houston MRN: [id number] Date of Birth: 07/03/1955  Subjective/Objective:                    Action/Plan:  Discussed home health and IV ABX with patient. Patient voiced understanding. Patient confirmed face sheet information. Patient lives with son and already has 3 in 55 and walker .   Referral made to [facility]. Will need orders face to face and prescription. Expected Discharge Date:                  Expected Discharge Plan:  [place]  In-House Referral:     Discharge planning Services  CM Consult  Post Acute Care Choice:    Choice offered to:  Patient  DME Arranged:    DME Agency:     HH Arranged:  RN, PT, OT [place] Agency:  [place]  Status of Service:  In process, will continue to follow  If discussed at Long Length of Stay Meetings, dates discussed:    Additional Comments:  Marilu Favre, RN 02/09/2017, 4:11 PM

## 2017-02-09 NOTE — Progress Notes (Signed)
[name], female   DOB: [birth year], 60 y.o.   MRN: [id number]          [facility] for Infectious Disease  Date of Admission:  02/05/2017   Total days of antibiotics 4        Day 2 daptomycin         ASSESSMENT: Cultures from 02/03/2017 and her operation on 02/06/2017 are negative. 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  PLAN: 1. Continue daptomycin 2. Start ciprofloxacin 3. She will need to have a central line placed by IR  Diagnosis: Left prosthetic knee infection  Culture Result: Culture-negative  Allergies  Allergen Reactions  . Compazine [Prochlorperazine] Shortness Of Breath and Swelling    TONGUE SWELLS  . Shellfish-Derived Products Anaphylaxis  . Iodinated Diagnostic Agents Hives and Rash  . Omnipaque [Iohexol] Hives    OPAT Orders Discharge antibiotics: Per pharmacy protocol IV daptomycin and oral ciprofloxacin  Duration: 6 weeks End Date: 03/20/2017  [facility] Care Per Protocol:  Labs weekly while on IV antibiotics: _x_ CBC with differential _x_ BMP __ CMP _x_ CRP _x_ ESR _x_ CK __ Vancomycin trough  __ Please pull PIC at completion of IV antibiotics _x_ Please leave PIC in place until doctor has seen patient or been notified  Fax weekly labs to [phone]  Clinic Follow Up Appt: I will arrange follow-up in my clinic in early October   Active Problems:   Infection of prosthetic left knee joint ([place])   Fever   Peripheral neuropathy   Hepatitis C   Chronic kidney disease (CKD), stage IV (severe) ([name])   Chronic diastolic CHF (congestive heart failure) ([name])   Diabetic peripheral neuropathy ([place])   Depression   Anemia of chronic disease   Diabetes mellitus, type II ([place])   AKI (acute kidney injury) ([place])   Hemarthrosis   . calcitRIOL  0.25 mcg Oral Q M,W,F  . enoxaparin (LOVENOX) injection  30 mg Subcutaneous Daily  . ferrous  sulfate  325 mg Oral BID WC  . gabapentin  100 mg Oral TID  . hydrALAZINE  25 mg Oral BID  . insulin aspart  0-9 Units Subcutaneous TID WC  . insulin glargine  15 Units Subcutaneous QAC breakfast  . mouth rinse  15 mL Mouth Rinse BID  . methocarbamol  500 mg Oral BID  . multivitamin with minerals  1 tablet Oral Daily  . nicotine  14 mg Transdermal Daily  . pantoprazole  40 mg Oral Daily  . senna-docusate  2 tablet Oral QHS  . sertraline  100 mg Oral Daily    SUBJECTIVE: Her pain is under better control.  Review of Systems: Review of Systems  Constitutional: Positive for fever. Negative for chills and diaphoresis.  Respiratory: Positive for cough. Negative for sputum production and shortness of breath.   Cardiovascular: Negative for chest pain.  Gastrointestinal: Negative for abdominal pain, diarrhea, nausea and vomiting.  Genitourinary:       She has a Foley catheter.  Musculoskeletal: Positive for joint pain.  Skin: Negative for itching and rash.    Allergies  Allergen Reactions  . Compazine [Prochlorperazine] Shortness Of Breath and Swelling    TONGUE SWELLS  . Shellfish-Derived Products Anaphylaxis  . Iodinated Diagnostic Agents Hives and Rash  . Omnipaque [Iohexol] Hives    OBJECTIVE: Vitals:   02/09/17 0500 02/09/17 0501 02/09/17 1317 02/09/17 1331  BP:  (!) 120/56 [name])  128/56 (!) 130/54  Pulse:  90 89 88  Resp:  '16 17 18  ' Temp:  98.9 F (37.2 C) 98.8 F (37.1 C) 98.8 F (37.1 C)  TempSrc:  Oral Oral Oral  SpO2:  99% 100% 95%  Weight: 157 lb 4.8 oz (71.4 kg)     Height:       Body mass index is 24.64 kg/m.  Physical Exam  Constitutional: She is oriented to person, place, and time.  She appears more comfortable today.  Cardiovascular: Normal rate and regular rhythm.   Murmur heard. Early 2/6 systolic murmur.  Pulmonary/Chest: Effort normal and breath sounds normal. She has no wheezes. She has no rales.  Abdominal: Soft. She exhibits no distension.  There is no tenderness.  Musculoskeletal:  She has a clean dressing over her left knee incision. There is no drain there is no unusual redness of her left leg.  Neurological: She is alert and oriented to person, place, and time.  Skin: No rash noted.  Psychiatric: Mood and affect normal.    Lab Results Lab Results  Component Value Date   WBC 12.0 (H) 02/09/2017   HGB 7.7 (L) 02/09/2017   HCT 24.0 (L) 02/09/2017   MCV 88.2 02/09/2017   PLT 357 02/09/2017    Lab Results  Component Value Date   CREATININE 3.88 (H) 02/09/2017   BUN 74 (H) 02/09/2017   NA 135 02/09/2017   K 4.3 02/09/2017   CL 105 02/09/2017   CO2 20 (L) 02/09/2017    Lab Results  Component Value Date   ALT 20 02/05/2017   AST 16 02/05/2017   ALKPHOS 117 02/05/2017   BILITOT 0.3 02/05/2017     Microbiology: Recent Results (from the past 240 hour(s))  Body Fluid Culture     Status: None   Collection Time: 02/03/17  4:34 PM  Result Value Ref Range Status   Gram Stain Moderate  Final   Gram Stain WBC present-predominately PMN  Final   Gram Stain No Organisms Seen  Final   Organism ID, Bacteria NO GROWTH  Final  Gram stain     Status: None   Collection Time: 02/03/17  4:34 PM  Result Value Ref Range Status   Gram Stain Moderate  Final   Gram Stain WBC present-predominately PMN  Final   Gram Stain No Organisms Seen  Final  Surgical pcr screen     Status: None   Collection Time: 02/05/17  9:20 PM  Result Value Ref Range Status   MRSA, PCR NEGATIVE NEGATIVE Final   Staphylococcus aureus NEGATIVE NEGATIVE Final    Comment:        The Xpert SA Assay (FDA approved for NASAL specimens in patients over 24 years of age), is one component of a comprehensive surveillance program.  Test performance has been validated by [facility] for patients greater than or equal to 40 year old. It is not intended to diagnose infection nor to guide or monitor treatment.   Aerobic/Anaerobic Culture (surgical/deep  wound)     Status: None (Preliminary result)   Collection Time: 02/06/17  4:22 PM  Result Value Ref Range Status   Specimen Description TISSUE LEFT KNEE  Final   Special Requests NONE  Final   Gram Stain   Final    MODERATE WBC PRESENT,BOTH PMN AND MONONUCLEAR NO ORGANISMS SEEN    Culture   Final    NO GROWTH 2 DAYS NO ANAEROBES ISOLATED; CULTURE IN PROGRESS FOR 5 DAYS  Report Status PENDING  Incomplete  Aerobic/Anaerobic Culture (surgical/deep wound)     Status: None (Preliminary result)   Collection Time: 02/06/17  5:01 PM  Result Value Ref Range Status   Specimen Description TISSUE  Final   Special Requests LEFT FEMORAL CANNEL  Final   Gram Stain   Final    FEW WBC PRESENT, PREDOMINANTLY MONONUCLEAR NO ORGANISMS SEEN    Culture   Final    NO GROWTH 2 DAYS NO ANAEROBES ISOLATED; CULTURE IN PROGRESS FOR 5 DAYS   Report Status PENDING  Incomplete  Culture, blood (routine x 2)     Status: None (Preliminary result)   Collection Time: 02/07/17  8:30 AM  Result Value Ref Range Status   Specimen Description BLOOD RIGHT HAND  Final   Special Requests IN PEDIATRIC BOTTLE Blood Culture adequate volume  Final   Culture NO GROWTH 2 DAYS  Final   Report Status PENDING  Incomplete  Culture, blood (routine x 2)     Status: None (Preliminary result)   Collection Time: 02/07/17  8:38 AM  Result Value Ref Range Status   Specimen Description BLOOD RIGHT ARM  Final   Special Requests IN PEDIATRIC BOTTLE Blood Culture adequate volume  Final   Culture NO GROWTH 2 DAYS  Final   Report Status PENDING  Incomplete    Michel Bickers, MD [facility] [phone] pager   [phone] cell 02/09/2017, 3:44 PM

## 2017-02-10 ENCOUNTER — Encounter (HOSPITAL_COMMUNITY): Payer: Self-pay | Admitting: Interventional Radiology

## 2017-02-10 ENCOUNTER — Inpatient Hospital Stay (HOSPITAL_COMMUNITY): Payer: Medicare Other

## 2017-02-10 HISTORY — PX: IR FLUORO GUIDE CV LINE RIGHT: IMG2283

## 2017-02-10 HISTORY — PX: IR US GUIDE VASC ACCESS RIGHT: IMG2390

## 2017-02-10 LAB — BPAM RBC
Blood Product Expiration Date: 201809172359
Blood Product Expiration Date: 201809192359
ISSUE DATE / TIME: 201808271301
ISSUE DATE / TIME: 201808271651
Unit Type and Rh: 6200
Unit Type and Rh: 6200

## 2017-02-10 LAB — CBC
HCT: 28.8 % — ABNORMAL LOW (ref 36.0–46.0)
Hemoglobin: 9.1 g/dL — ABNORMAL LOW (ref 12.0–15.0)
MCH: 27.4 pg (ref 26.0–34.0)
MCHC: 31.6 g/dL (ref 30.0–36.0)
MCV: 86.7 fL (ref 78.0–100.0)
Platelets: 331 10*3/uL (ref 150–400)
RBC: 3.32 MIL/uL — ABNORMAL LOW (ref 3.87–5.11)
RDW: 15.3 % (ref 11.5–15.5)
WBC: 10.8 10*3/uL — ABNORMAL HIGH (ref 4.0–10.5)

## 2017-02-10 LAB — BASIC METABOLIC PANEL
Anion gap: 9 (ref 5–15)
BUN: 74 mg/dL — ABNORMAL HIGH (ref 6–20)
CO2: 20 mmol/L — ABNORMAL LOW (ref 22–32)
Calcium: 8.4 mg/dL — ABNORMAL LOW (ref 8.9–10.3)
Chloride: 107 mmol/L (ref 101–111)
Creatinine, Ser: 3.82 mg/dL — ABNORMAL HIGH (ref 0.44–1.00)
GFR calc Af Amer: 14 mL/min — ABNORMAL LOW (ref >60)
GFR calc non Af Amer: 12 mL/min — ABNORMAL LOW (ref >60)
Glucose, Bld: 75 mg/dL (ref 65–99)
Potassium: 4.1 mmol/L (ref 3.5–5.1)
Sodium: 136 mmol/L (ref 135–145)

## 2017-02-10 LAB — TYPE AND SCREEN
ABO/RH(D): A POS
Antibody Screen: NEGATIVE
Unit division: 0
Unit division: 0

## 2017-02-10 LAB — URINE CULTURE: Culture: >=100000 — AB

## 2017-02-10 LAB — GLUCOSE, CAPILLARY
Glucose-Capillary: 77 mg/dL (ref 65–99)
Glucose-Capillary: 88 mg/dL (ref 65–99)
Glucose-Capillary: 97 mg/dL (ref 65–99)
Glucose-Capillary: 119 mg/dL — ABNORMAL HIGH (ref 65–99)

## 2017-02-10 MED ORDER — LIDOCAINE HCL (PF) 1 % IJ SOLN
INTRAMUSCULAR | Status: AC
  Filled 2017-02-10: qty 30

## 2017-02-10 MED ORDER — LIDOCAINE HCL 1 % IJ SOLN
INTRAMUSCULAR | Status: DC | PRN
  Administered 2017-02-10: 10 mL

## 2017-02-10 MED ORDER — FUROSEMIDE 80 MG PO TABS
80.0000 mg | ORAL_TABLET | Freq: Two times a day (BID) | ORAL | Status: DC
  Administered 2017-02-10 – 2017-02-18 (×17): 80 mg via ORAL
  Filled 2017-02-10 (×17): qty 1

## 2017-02-10 NOTE — Progress Notes (Signed)
Noted bledding from the incision area. Md made aware, dressing change per MD instruction. Will continue to monitor, cold compression applied.

## 2017-02-10 NOTE — Progress Notes (Addendum)
PROGRESS NOTE    SHRITA THIEN  GTX:[id number] DOB: 12/20/1955 DOA: 02/05/2017 PCP: [name], MD   No chief complaint on file.  Brief Narrative:  HPI On 02/05/2017 Brandy Houston is a 61 y.o. female with a medical history of diastolic heart failure, chronic kidney disease, diabetes, hypertension, septic arthritis, presented to the hospital from Dr. [name] office (ortho). Patient presented to his office on 02/03/2017 for left knee pain, status post aspiration. Fluid showed greater than 58,000 WBC. Patient states over the past several months, she's been having problems walking and her knee has been popping out of place. Currently patient denies any chest pain, shortness breath, abdominal pain, nausea vomiting, diarrhea or constipation, dizziness or headache. She denies any recent trauma or falls. Denies any recent travel or ill contacts. Patient has been seeing a hematologist for hemarthrosis and has had several bloody knee effusions. Per documentation, unlikely to be coagulopathy. Patient has not been on any antibiotics recently.   Interim history Underwent left total knee excision with placement of antibiotic spacer. Currently on daptomycin and ciprofloxacin. Continues to have worsening creatinine, nephrology following. 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 [name] -s/p aspiration yielding 120 mL's of bloody fluid (on 02/03/2017) -Fluid shows 58,000 WBC. Gram stain showed no organisms -s/p left total knee excision arthroplasty and placement of articulating methylmethacrylate antibiotic spacer; debridement of bone, muscle and skin 15x12cm; wound repair left knee 15cm -PT/OT consulted- recommended home health -Continue pain control -Wound cultures show no growth to date -Blood cultures show no growth to date -obtained CK level 77 -Infectious disease consultation appreciated to aid with  antibiotics -Continue daptomycin and ciprofloxacin, will need IV daptomycin for 6 weeks per infectious disease recommendations -ID recommended central line placement by IR.  Discussed with renal: would prefer IJ triple lumen or central PICC -will consult IR  Fever -Patient spiked fever overnight of 101.12F on 8/25.  -Patient has been afebrile for 24 hours -Vitals currently stable, leukocytosis actually improving -Blood cultures show no growth to date, UA and urine culture as listed below -Continue to monitor   Leukocytosis  -WBC improving today to 10.8 -likely reactive to recent surgery vs possible knee infection -Currently on daptomycin and cipro -Blood cultures show no growth to date -CXR unremarkable for infection -UA TNTC WBC, large leukocyte, few bacteria (patient not complaining of any urinary symptoms) -Urine culture>100k yeast, patient has no complaints of urinary symptoms  -wound culture shows no growth to date -Continue to monitor CBC  Chronic diastolic heart failure -Echocardiogram February 2016 showed a grade 2 diastolic dysfunction -Currently patient appears to be euvolemic and compensated -Takes metolazone and Lasix at home- currently held due to AKI -Patient was given IV lasix between blood transfusions to avoid volume overload on admission -will give additional dose of lasix today as patient will receive [facility] -Monitor intake and output, daily weights -urine output over past 24hrs 1050cc  Acute kidney injury vs Chronic kidney disease, stage IV -Patient follows with Dr. 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 Early evidence of diabetic glomerulopathy -Creatinine 3.82 (baseline 2.3-2.5)  -In July 2017, Creatinine was 3.7- ?new baseline  -Possibly due to anemia vs diuretics (given IV lasix between blood  transfusions) -Nephrology consulted and appreciated, started patient on lasix 80mg  PO today -Continue to  monitor BMP  -Continue foley catheter for accurate monitoring of I/Os given that patient has received IV lasix and has worsening renal function  Diabetes mellitus, type II with neuropathy -Patient uses Lantus 15 units daily at home -Continue Lantus, insulin sliding scale was CBG monitoring -Hemoglobin A1c 6.2 -Continue gabapentin for neuropathy  Essential hypertension -Continue hydralazine -metoprolol and amlodipine discontinued to aid in perfusion  Depression -Continue Zoloft  Tobacco abuse -Smoking cessation discussed -Continue Nicotine patch  Anemia of chronic disease/ hemarthrosis/ Anemia due to blood loss -Hemoglobin was 7.1 on admission (baseline hemoglobin 9 over the past year) -Has had a history of hemarthrosis and was seeing oncology. Patient was receiving Aranesp injections every 30 days. -Patient denies any complaints of melena or hematochezia -Recently had anemia panel on 02/02/2017 which showed iron of 18, ferritin of 52 -Given 7uPRBC since admission -Hemoglobin currently 9.1 -Continue iron supplementation  -Continue to monitor CBC  Hepatitis C -Per patient, stable -Continue outpatient follow up  DVT Prophylaxis  SCDs/lovenox  Code Status: Full  Family Communication: None at bedside. Discussed with son via phone (he will be out of town until Thursday evening)  Disposition Plan: Admitted. Dispo to home with home health when stable  Consultants Orthopedic surgery, Dr. [name] Nephrology Infectious disease Interventional radiology  Procedures  Left total knee excision arthroplasty and placement of articulating methylmethacrylate antibiotic spacer; debridement of bone, muscle and skin 15x12cm; wound repair left knee 15cm  Antibiotics   Anti-infectives    Start     Dose/Rate Route Frequency Ordered Stop   02/10/17 1052  DAPTOmycin (CUBICIN) 420 mg  in sodium chloride 0.9 % IVPB     420 mg 216.8 mL/hr over 30 Minutes Intravenous Every 48 hours 02/08/17 1255     02/09/17 1700  ciprofloxacin (CIPRO) tablet 250 mg     250 mg Oral Daily 02/09/17 1552     02/08/17 1600  vancomycin (VANCOCIN) IVPB 1000 mg/200 mL premix  Status:  Discontinued     1,000 mg 200 mL/hr over 60 Minutes Intravenous Every 48 hours 02/06/17 2214 02/07/17 1717   02/08/17 1000  DAPTOmycin (CUBICIN) 420 mg in sodium chloride 0.9 % IVPB  Status:  Discontinued     420 mg 216.8 mL/hr over 30 Minutes Intravenous Every 24 hours 02/07/17 1805 02/08/17 1255   02/06/17 1806  tobramycin (NEBCIN) powder  Status:  Discontinued       As needed 02/06/17 1841 02/06/17 1900   02/06/17 1610  vancomycin (VANCOCIN) powder  Status:  Discontinued       As needed 02/06/17 1611 02/06/17 1900   02/06/17 1458  vancomycin (VANCOCIN) 1-5 GM/200ML-% IVPB    Comments:  Schonewitz, Leigh   : cabinet override      02/06/17 1458 02/07/17 0259      Subjective:   [name] seen and examined today.   Continues to have pain in her left leg. Denies chest pain, shortness breath, abdominal pain, nausea or vomiting, diarrhea or constipation.  Objective:   Vitals:   02/09/17 2006 02/09/17 2024 02/10/17 0451 02/10/17 0500  BP: (!) 153/55 (!) 147/84 135/65   Pulse: 85 94 81   Resp: 18 18 18    Temp: 99 F (37.2 C) 98.7 F (37.1 C) 98.2 F (36.8 C)   TempSrc: Oral Oral Oral   SpO2: 97% 100% 96%   Weight:    70 kg (154 lb 4.8 oz)  Height:        Intake/Output Summary (Last 24 hours) at 02/10/17 1133  Last data filed at 02/10/17 0936  Gross per 24 hour  Intake             1460 ml  Output             1550 ml  Net              -90 ml   Filed Weights   02/08/17 0547 02/09/17 0500 02/10/17 0500  Weight: 71.1 kg (156 lb 11.2 oz) 71.4 kg (157 lb 4.8 oz) 70 kg (154 lb 4.8 oz)   Exam  General: Well developed, well nourished, NAD, appears stated age  HEENT: NCAT, mucous membranes moist.    Cardiovascular: S1 S2 auscultated, no rubs, murmurs or gallops. Regular rate and rhythm.  Respiratory: Clear to auscultation bilaterally with equal chest rise  Abdomen: Soft, nontender, nondistended, + bowel sounds  Extremities: warm dry without cyanosis clubbing or edema RLE. LLE with dressing with brace.   Neuro: AAOx3, nonfocal  Psych: Appropriate mood and insight, pleasant  Data Reviewed: I have personally reviewed following labs and imaging studies  CBC:  Recent Labs Lab 02/03/17 1635  02/05/17 1844 02/06/17 0815 02/06/17 1754 02/07/17 0238 02/08/17 0337 02/09/17 0308 02/10/17 0624  WBC 12.4*  < > 10.3 8.1  --  15.0* 12.2* 12.0* 10.8*  NEUTROABS 9.3*  --  7.7  --   --   --   --   --   --   HGB 7.7*  < > 7.1* 8.8* 8.8* 9.5* 8.5* 7.7* 9.1*  HCT 24.9*  < > 22.1* 26.8* 26.0* 28.9* 26.0* 24.0* 28.8*  MCV 92  < > 84.7 85.4  --  84.5 87.2 88.2 86.7  PLT  --   < > 521* 464*  --  391 375 357 331  < > = values in this interval not displayed. Basic Metabolic Panel:  Recent Labs Lab 02/05/17 1844 02/06/17 0815 02/06/17 1754 02/07/17 0238 02/08/17 0337 02/09/17 0308 02/10/17 0624  NA 134* 137 140 138 139 135 136  K 3.4* 3.6 3.7 4.4 4.4 4.3 4.1  CL 101 103  --  106 105 105 107  CO2 21* 21*  --  21* 22 20* 20*  GLUCOSE 180* 88 135* 167* 101* 116* 75  BUN 79* 77*  --  72* 73* 74* 74*  CREATININE 3.87* 3.66*  --  3.67* 3.74* 3.88* 3.82*  CALCIUM 8.7* 8.8*  --  8.0* 8.2* 8.2* 8.4*  MG 2.1  --   --   --   --   --   --    GFR: Estimated Creatinine Clearance: 14.8 mL/min (A) (by C-G formula based on SCr of 3.82 mg/dL (H)). Liver Function Tests:  Recent Labs Lab 02/05/17 1844  AST 16  ALT 20  ALKPHOS 117  BILITOT 0.3  PROT 7.8  ALBUMIN 2.6*   No results for input(s): LIPASE, AMYLASE in the last 168 hours. No results for input(s): AMMONIA in the last 168 hours. Coagulation Profile: No results for input(s): INR, PROTIME in the last 168 hours. Cardiac  Enzymes:  Recent Labs Lab 02/08/17 1536  CKTOTAL 77   BNP (last 3 results) No results for input(s): PROBNP in the last 8760 hours. HbA1C: No results for input(s): HGBA1C in the last 72 hours. CBG:  Recent Labs Lab 02/09/17 0801 02/09/17 1215 02/09/17 1710 02/09/17 2028 02/10/17 0750  GLUCAP 92 100* 119* 105* 77   Lipid Profile: No results for input(s): CHOL, HDL, LDLCALC, TRIG, CHOLHDL, LDLDIRECT in the last 72  hours. Thyroid Function Tests: No results for input(s): TSH, T4TOTAL, FREET4, T3FREE, THYROIDAB in the last 72 hours. Anemia Panel: No results for input(s): VITAMINB12, FOLATE, FERRITIN, TIBC, IRON, RETICCTPCT in the last 72 hours. Urine analysis:    Component Value Date/Time   COLORURINE AMBER (A) 02/08/2017 1138   APPEARANCEUR TURBID (A) 02/08/2017 1138   LABSPEC 1.014 02/08/2017 1138   PHURINE 5.0 02/08/2017 1138   GLUCOSEU NEGATIVE 02/08/2017 1138   HGBUR MODERATE (A) 02/08/2017 1138   BILIRUBINUR NEGATIVE 02/08/2017 1138   KETONESUR NEGATIVE 02/08/2017 1138   PROTEINUR 100 (A) 02/08/2017 1138   UROBILINOGEN 0.2 09/02/2014 1504   NITRITE NEGATIVE 02/08/2017 1138   LEUKOCYTESUR LARGE (A) 02/08/2017 1138   Sepsis Labs: @LABRCNTIP (procalcitonin:4,lacticidven:4)  ) Recent Results (from the past 240 hour(s))  Body Fluid Culture     Status: None   Collection Time: 02/03/17  4:34 PM  Result Value Ref Range Status   Gram Stain Moderate  Final   Gram Stain WBC present-predominately PMN  Final   Gram Stain No Organisms Seen  Final   Organism ID, Bacteria NO GROWTH  Final  Gram stain     Status: None   Collection Time: 02/03/17  4:34 PM  Result Value Ref Range Status   Gram Stain Moderate  Final   Gram Stain WBC present-predominately PMN  Final   Gram Stain No Organisms Seen  Final  Surgical pcr screen     Status: None   Collection Time: 02/05/17  9:20 PM  Result Value Ref Range Status   MRSA, PCR NEGATIVE NEGATIVE Final   Staphylococcus aureus  NEGATIVE NEGATIVE Final    Comment:        The Xpert SA Assay (FDA approved for NASAL specimens in patients over 58 years of age), is one component of a comprehensive surveillance program.  Test performance has been validated by [facility] for patients greater than or equal to 44 year old. It is not intended to diagnose infection nor to guide or monitor treatment.   Aerobic/Anaerobic Culture (surgical/deep wound)     Status: None (Preliminary result)   Collection Time: 02/06/17  4:22 PM  Result Value Ref Range Status   Specimen Description TISSUE LEFT KNEE  Final   Special Requests NONE  Final   Gram Stain   Final    MODERATE WBC PRESENT,BOTH PMN AND MONONUCLEAR NO ORGANISMS SEEN    Culture   Final    NO GROWTH 2 DAYS NO ANAEROBES ISOLATED; CULTURE IN PROGRESS FOR 5 DAYS   Report Status PENDING  Incomplete  Aerobic/Anaerobic Culture (surgical/deep wound)     Status: None (Preliminary result)   Collection Time: 02/06/17  5:01 PM  Result Value Ref Range Status   Specimen Description TISSUE  Final   Special Requests LEFT FEMORAL CANNEL  Final   Gram Stain   Final    FEW WBC PRESENT, PREDOMINANTLY MONONUCLEAR NO ORGANISMS SEEN    Culture   Final    NO GROWTH 2 DAYS NO ANAEROBES ISOLATED; CULTURE IN PROGRESS FOR 5 DAYS   Report Status PENDING  Incomplete  Culture, blood (routine x 2)     Status: None (Preliminary result)   Collection Time: 02/07/17  8:30 AM  Result Value Ref Range Status   Specimen Description BLOOD RIGHT HAND  Final   Special Requests IN PEDIATRIC BOTTLE Blood Culture adequate volume  Final   Culture NO GROWTH 2 DAYS  Final   Report Status PENDING  Incomplete  Culture, blood (routine  x 2)     Status: None (Preliminary result)   Collection Time: 02/07/17  8:38 AM  Result Value Ref Range Status   Specimen Description BLOOD RIGHT ARM  Final   Special Requests IN PEDIATRIC BOTTLE Blood Culture adequate volume  Final   Culture NO GROWTH 2 DAYS  Final    Report Status PENDING  Incomplete  Culture, Urine     Status: Abnormal   Collection Time: 02/08/17 11:38 AM  Result Value Ref Range Status   Specimen Description URINE, RANDOM  Final   Special Requests CX ADDED AT 0327 ON [id number]  Final   Culture >=100,000 COLONIES/mL YEAST (A)  Final   Report Status 02/10/2017 FINAL  Final      Radiology Studies: No results found.   Scheduled Meds: . calcitRIOL  0.25 mcg Oral Q M,W,F  . ciprofloxacin  250 mg Oral Daily  . enoxaparin (LOVENOX) injection  30 mg Subcutaneous Daily  . ferrous sulfate  325 mg Oral BID WC  . furosemide  80 mg Oral BID  . gabapentin  100 mg Oral TID  . hydrALAZINE  25 mg Oral BID  . insulin aspart  0-9 Units Subcutaneous TID WC  . insulin glargine  15 Units Subcutaneous QAC breakfast  . mouth rinse  15 mL Mouth Rinse BID  . methocarbamol  500 mg Oral BID  . multivitamin with minerals  1 tablet Oral Daily  . nicotine  14 mg Transdermal Daily  . pantoprazole  40 mg Oral Daily  . senna-docusate  2 tablet Oral QHS  . sertraline  100 mg Oral Daily   Continuous Infusions: . DAPTOmycin (CUBICIN)  IV 420 mg (02/10/17 1112)     LOS: 5 days   Time Spent in minutes   35 minutes  Bessie Livingood D.O. on 02/10/2017 at 11:33 AM  Between 7am to 7pm - Pager - [phone]  After 7pm go to [email or web address] - password [email or web address]  And look for the night coverage person covering for me after hours  [facility] Office  [phone]

## 2017-02-10 NOTE — Progress Notes (Signed)
[name], female   DOB: 08/21/1955, 61 y.o.   MRN: [id number] [facility] Progress Note   Assessment/ Plan:   1. 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 Urine output noted from overnight-likely inaccurate collection but remains in the nonoliguric range. No acute electrolyte abnormality or acute indications for hemodialysis. We'll discontinue intravenous fluids at this time and start furosemide beginning tonight. Her left forearm graft appears to be thrombosed at this time but unfortunately because she has acute renal insufficiency, would not want her to be subjected to thrombectomy/iodinated intravenous contrast exposure at this time. May attempt graft thrombectomy after renal recovery. 2. Septic prosthetic left knee: Status post excision of prosthesis with antibiotic impregnated spacer. On Cubicin. 3. Hypertension: she remains on monotherapy with hydralazine to allow for increased renal perfusion/higher blood pressures. Restart furosemide today. 4. Anemia: transfused yesterday with good improvement of hemoglobin overnight.  Subjective:   She continues to complain of left knee/surgical site pain but denies any chest pain or shortness of breath.   Objective:   BP 135/65 (BP Location: Right Arm)   Pulse 81   Temp 98.2 F (36.8 C) (Oral)   Resp 18   Ht 5\' 7"  (1.702 m)   Wt 70 kg (154 lb 4.8 oz)   LMP 03/16/2002   SpO2 96%   BMI 24.17 kg/m   Intake/Output Summary (Last 24 hours) at 02/10/17 0934 Last data filed at 02/10/17 0546  Gross per 24 hour  Intake             1340 ml  Output             1050 ml  Net              290 ml   Weight change: -1.361 kg (-3 lb)  Physical Exam: HMC:NOBSJGG to be comfortable resting in  bed EZM:OQHUT regular rhythm, normal rate, S1 and S2 normal Resp:clear to auscultation, no rales/rhonchi MLY:YTKP, flat, nontender TWS:FKCL leg in supportive soft dressing, right leg no edema. No palpable thrill or audible bruit left forearm loop graft.  Imaging: No results found.  Labs: BMET  Recent Labs Lab 02/05/17 1844 02/06/17 0815 02/06/17 1754 02/07/17 0238 02/08/17 0337 02/09/17 0308 02/10/17 0624  NA 134* 137 140 138 139 135 136  K 3.4* 3.6 3.7 4.4 4.4 4.3 4.1  CL 101 103  --  106 105 105 107  CO2 21* 21*  --  21* 22 20* 20*  GLUCOSE 180* 88 135* 167* 101* 116* 75  BUN 79* 77*  --  72* 73* 74* 74*  CREATININE 3.87* 3.66*  --  3.67* 3.74* 3.88* 3.82*  CALCIUM 8.7* 8.8*  --  8.0* 8.2* 8.2* 8.4*   CBC  Recent Labs Lab 02/03/17 1635 02/05/17 1844  02/07/17 0238 02/08/17 0337 02/09/17 0308 02/10/17 0624  WBC 12.4* 10.3  < > 15.0* 12.2* 12.0* 10.8*  NEUTROABS 9.3* 7.7  --   --   --   --   --   HGB 7.7* 7.1*  < > 9.5* 8.5* 7.7* 9.1*  HCT 24.9* 22.1*  < > 28.9* 26.0* 24.0* 28.8*  MCV 92 84.7  < > 84.5 87.2 88.2 86.7  PLT  --  521*  < > 391 375 357 331  < > = values in this interval  not displayed.  Medications:    . calcitRIOL  0.25 mcg Oral Q M,W,F  . ciprofloxacin  250 mg Oral Daily  . enoxaparin (LOVENOX) injection  30 mg Subcutaneous Daily  . ferrous sulfate  325 mg Oral BID WC  . gabapentin  100 mg Oral TID  . hydrALAZINE  25 mg Oral BID  . insulin aspart  0-9 Units Subcutaneous TID WC  . insulin glargine  15 Units Subcutaneous QAC breakfast  . mouth rinse  15 mL Mouth Rinse BID  . methocarbamol  500 mg Oral BID  . multivitamin with minerals  1 tablet Oral Daily  . nicotine  14 mg Transdermal Daily  . pantoprazole  40 mg Oral Daily  . senna-docusate  2 tablet Oral QHS  . sertraline  100 mg Oral Daily   Elmarie Shiley, MD 02/10/2017, 9:34 AM

## 2017-02-10 NOTE — Progress Notes (Signed)
Patient is stable from ortho stand point. Received blood transfusion last night, retransfuse as indicated Daily dry dressing changes as needed Cultures are NGTD, continue dapto PICC line ordered I think she'll be ready for discharge once her hemoglobin stabilizes and the infectious disease recommendations are finalized. She can follow-up with me in the office in 2 weeks for suture removal. Continue with touchdown weightbearing. Range of motion of the left knee as tolerated. Knee immobilizer when out of bed.

## 2017-02-10 NOTE — Progress Notes (Signed)
[name], female   DOB: 1955-09-10, 60 y.o.   MRN: [id number]          [facility]    Date of Admission:  02/05/2017   Total days of antibiotics 5         Brandy Houston is currently out of her room to have interventional radiology place her central. She has culture negative prosthetic knee infection. I will sign off now but will arrange follow-up in my clinic.         Diagnosis: Left prosthetic knee infection  Culture Result: Culture-negative       Allergies  Allergen Reactions  . Compazine [Prochlorperazine] Shortness Of Breath and Swelling    TONGUE SWELLS  . Shellfish-Derived Products Anaphylaxis  . Iodinated Diagnostic Agents Hives and Rash  . Omnipaque [Iohexol] Hives    OPAT Orders Discharge antibiotics: Per pharmacy protocol IV daptomycin and oral ciprofloxacin  Duration: 6 weeks End Date: 03/20/2017  [facility] Care Per Protocol:  Labs weekly while on IV antibiotics: _x_ CBC with differential _x_ BMP __ CMP _x_ CRP _x_ ESR _x_ CK __ Vancomycin trough  __ Please pull PIC at completion of IV antibiotics _x_ Please leave PIC in place until doctor has seen patient or been notified  Fax weekly labs to [phone]  Clinic Follow Up Appt: I will arrange follow-up in my clinic in early October   Michel Bickers, [place] for Naomi [phone] pager   [phone] cell 02/10/2017, 1:18 PM

## 2017-02-10 NOTE — Procedures (Signed)
Knee infection  S/p rt IJ TUNNELED LS PICC  Tip svcra No comp Stable EBL 0 FULL REPORT IN PACS READY FOR USE

## 2017-02-11 ENCOUNTER — Encounter (HOSPITAL_COMMUNITY): Payer: Medicaid Other

## 2017-02-11 DIAGNOSIS — Z96659 Presence of unspecified artificial knee joint: Secondary | ICD-10-CM

## 2017-02-11 LAB — CBC
HCT: 27.1 % — ABNORMAL LOW (ref 36.0–46.0)
Hemoglobin: 8.9 g/dL — ABNORMAL LOW (ref 12.0–15.0)
MCH: 28.5 pg (ref 26.0–34.0)
MCHC: 32.8 g/dL (ref 30.0–36.0)
MCV: 86.9 fL (ref 78.0–100.0)
Platelets: 334 10*3/uL (ref 150–400)
RBC: 3.12 MIL/uL — ABNORMAL LOW (ref 3.87–5.11)
RDW: 15.2 % (ref 11.5–15.5)
WBC: 8.3 10*3/uL (ref 4.0–10.5)

## 2017-02-11 LAB — GLUCOSE, CAPILLARY
Glucose-Capillary: 93 mg/dL (ref 65–99)
Glucose-Capillary: 133 mg/dL — ABNORMAL HIGH (ref 65–99)
Glucose-Capillary: 84 mg/dL (ref 65–99)
Glucose-Capillary: 105 mg/dL — ABNORMAL HIGH (ref 65–99)

## 2017-02-11 LAB — BASIC METABOLIC PANEL
Anion gap: 11 (ref 5–15)
BUN: 66 mg/dL — ABNORMAL HIGH (ref 6–20)
CO2: 21 mmol/L — ABNORMAL LOW (ref 22–32)
Calcium: 8.7 mg/dL — ABNORMAL LOW (ref 8.9–10.3)
Chloride: 108 mmol/L (ref 101–111)
Creatinine, Ser: 3.5 mg/dL — ABNORMAL HIGH (ref 0.44–1.00)
GFR calc Af Amer: 15 mL/min — ABNORMAL LOW (ref >60)
GFR calc non Af Amer: 13 mL/min — ABNORMAL LOW (ref >60)
Glucose, Bld: 83 mg/dL (ref 65–99)
Potassium: 3.8 mmol/L (ref 3.5–5.1)
Sodium: 140 mmol/L (ref 135–145)

## 2017-02-11 LAB — AEROBIC/ANAEROBIC CULTURE W GRAM STAIN (SURGICAL/DEEP WOUND): Culture: NO GROWTH

## 2017-02-11 NOTE — Progress Notes (Addendum)
Physical Therapy Treatment Patient Details Name: Brandy Houston MRN: [id number] DOB: 12-16-1955 Today's Date: 02/11/2017    History of Present Illness 61 y.o. 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 Presented to the hospital from Dr. [name] office (ortho) status post aspiration with elevated WBC. ADmitted with septic knee. 8/24 Pt s/p Lt TKA excision arthroplasty with spacer placed    PT Comments    Patient required min/mod A for mobility and able to ambulate 88ft this session. Given pt's current mobility level recommending ST-SNF for further skilled PT services to maximize independence and safety with mobility prior to d/c home with intermittent assistance from family.    Follow Up Recommendations  SNF;Supervision/Assistance - 24 hour     Equipment Recommendations  Other (comment) (TBD next venue)    Recommendations for Other Services       Precautions / Restrictions Precautions Precautions: Fall Restrictions Weight Bearing Restrictions: Yes LLE Weight Bearing: Touchdown weight bearing    Mobility  Bed Mobility Overal bed mobility: Needs Assistance Bed Mobility: Sit to Supine;Supine to Sit     Supine to sit: Min assist Sit to supine: Min assist   General bed mobility comments: MinA for LLE management; HOB elevated and use of rail  Transfers Overall transfer level: Needs assistance Equipment used: Rolling walker (2 wheeled) Transfers: Sit to/from Stand Sit to Stand: Min assist         General transfer comment: ; assist to power up into standing and to steady  Ambulation/Gait Ambulation/Gait assistance: Mod assist Ambulation Distance (Feet): 5 Feet Assistive device: Rolling walker (2 wheeled) Gait Pattern/deviations: Step-to pattern;Decreased stance time - left;Decreased step length - right;Decreased weight shift to left;Trunk flexed Gait velocity: decreased    General Gait Details: cues for sequencing, maintaining TDWB, and proximity of RW; assistance required to weight shift; pt with difficulty advancing L LE    Stairs            Wheelchair Mobility    Modified Rankin (Stroke Patients Only)       Balance     Sitting balance-Leahy Scale: Good       Standing balance-Leahy Scale: Poor                              Cognition Arousal/Alertness: Awake/alert Behavior During Therapy: WFL for tasks assessed/performed Overall Cognitive Status: Within Functional Limits for tasks assessed                                        Exercises General Exercises - Lower Extremity Ankle Circles/Pumps: AROM;Both;20 reps Quad Sets: AROM;Both;10 reps;Supine    General Comments        Pertinent Vitals/Pain Pain Assessment: Faces Faces Pain Scale: Hurts even more Pain Location: left knee with mobility Pain Descriptors / Indicators: Sore;Grimacing;Guarding Pain Intervention(s): Limited activity within patient's tolerance;Monitored during session;Premedicated before session;Repositioned    Home Living                      Prior Function            PT Goals (current goals can now be found in the care plan section) Progress towards PT goals: Progressing toward goals    Frequency    Min 3X/week      PT  Plan Discharge plan needs to be updated    Co-evaluation              AM-PAC PT "6 Clicks" Daily Activity  Outcome Measure  Difficulty turning over in bed (including adjusting bedclothes, sheets and blankets)?: A Little Difficulty moving from lying on back to sitting on the side of the bed? : A Lot Difficulty sitting down on and standing up from a chair with arms (e.g., wheelchair, bedside commode, etc,.)?: Unable Help needed moving to and from a bed to chair (including a wheelchair)?: A Little Help needed walking in hospital room?: A Lot Help needed climbing 3-5 steps with a  railing? : Total 6 Click Score: 12    End of Session Equipment Utilized During Treatment: Gait belt;Left knee immobilizer Activity Tolerance: Patient tolerated treatment well Patient left: in bed;with call bell/phone within reach Nurse Communication: Mobility status PT Visit Diagnosis: Difficulty in walking, not elsewhere classified (R26.2);Muscle weakness (generalized) (M62.81);Other abnormalities of gait and mobility (R26.89);Pain Pain - Right/Left: Left Pain - part of body: Knee     Time: 9295-7473 PT Time Calculation (min) (ACUTE ONLY): 22 min  Charges:  $Gait Training: 8-22 mins                    G Codes:       Earney Navy, PTA Pager: [phone]     Darliss Cheney 02/11/2017, 5:29 PM

## 2017-02-11 NOTE — Progress Notes (Signed)
PROGRESS NOTE    Brandy Houston  TFT:[id number] DOB: 08-Dec-1955 DOA: 02/05/2017 PCP: [name], MD   No chief complaint on file.  Brief Narrative:  HPI On 02/05/2017 Brandy Houston is a 61 y.o. female with a medical history of diastolic heart failure, chronic kidney disease, diabetes, hypertension, septic arthritis, presented to the hospital from Dr. [name] office (ortho). Patient presented to his office on 02/03/2017 for left knee pain, status post aspiration. Fluid showed greater than 58,000 WBC. Patient states over the past several months, she's been having problems walking and her knee has been popping out of place. Currently patient denies any chest pain, shortness breath, abdominal pain, nausea vomiting, diarrhea or constipation, dizziness or headache. She denies any recent trauma or falls. Denies any recent travel or ill contacts. Patient has been seeing a hematologist for hemarthrosis and has had several bloody knee effusions. Per documentation, unlikely to be coagulopathy. Patient has not been on any antibiotics recently.   Interim history Underwent left total knee excision with placement of antibiotic spacer. Currently on daptomycin and ciprofloxacin. 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 [name] -s/p aspiration yielding 120 mL's of bloody fluid (on 02/03/2017) -Fluid shows 58,000 WBC. Gram stain showed no organisms -s/p left total knee excision arthroplasty and placement of articulating methylmethacrylate antibiotic spacer; debridement of bone, muscle and skin 15x12cm; wound repair left knee 15cm -PT/OT consulted-  Has recommended home health -Continue supportive therapy -Wound cultures show no growth to date -Blood cultures show no growth to date -obtained CK level 77 -Infectious disease consultation appreciated to aid with antibiotics -Continue  daptomycin and ciprofloxacin, will need IV daptomycin for 6 weeks per infectious disease recommendations -Central line placed by IR  Fever  -Patient been afebrile for 24 hours -Vitals currently stable, leukocytosis actually improving -Blood cultures show no growth to date, UA and urine culture listed below -Continue to monitor   Leukocytosis  -WBC improving today to 10.8 -likely reactive to recent surgery vs possible knee infection -Currently on daptomycin and cipro -Blood cultures show no growth to date -CXR unremarkable for infection -UA TNTC WBC, large leukocyte, few bacteria (patient not complaining of any urinary symptoms) -Urine culture>100k yeast, patient has no complaints of urinary symptoms  -wound culture shows no growth to date -Continue to monitor CBC  Chronic diastolic heart failure -Echocardiogram February 2016 showed a grade 2 diastolic dysfunction -Currently patient appears to be euvolemic and compensated -Takes metolazone and Lasix at home- currently held due to AKI -Patient was given IV lasix between blood transfusions to avoid volume overload on admission -will give additional dose of lasix today as patient will receive [facility] -Monitor intake and output, daily weights -urine output over past 24hrs 1050cc  Acute kidney injury vs Chronic kidney disease, stage IV -Patient follows with Dr. 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 Early evidence of diabetic glomerulopathy -Creatinine 3.82 (baseline 2.3-2.5)  -In July 2017, Creatinine was 3.7- ?new baseline  -Possibly due to anemia vs diuretics (given IV lasix between blood transfusions) -Nephrology consulted and appreciated, started patient on lasix 92m PO today -Continue to monitor BMP  -Continue foley catheter for accurate monitoring of I/Os given that  patient has received IV lasix and has worsening renal function  Diabetes  mellitus, type II with neuropathy -Patient uses Lantus 15 units daily at home -Continue Lantus, insulin sliding scale was CBG monitoring -Hemoglobin A1c 6.2 -Continue gabapentin for neuropathy  Essential hypertension -Continue hydralazine -metoprolol and amlodipine discontinued  Depression -Continue Zoloft  Tobacco abuse -Smoking cessation discussed -Continue Nicotine patch  Anemia of chronic disease/ hemarthrosis/ Anemia due to blood loss -Hemoglobin was 7.1 on admission (baseline hemoglobin 9 over the past year) -Has had a history of hemarthrosis and was seeing oncology. Patient was receiving Aranesp injections every 30 days. -Patient denies any complaints of melena or hematochezia -Recently had anemia panel on 02/02/2017 which showed iron of 18, ferritin of 52 -Given 7uPRBC since admission -Hemoglobin currently 9.1 -Continue iron supplementation  -Continue to monitor CBC  Hepatitis C -Per patient, stable -Continue outpatient follow up  DVT Prophylaxis  SCDs/lovenox  Code Status: Full  Family Communication: None at bedside.   Disposition Plan: With improvement in condition  Consultants Orthopedic surgery, Dr. [name] Nephrology Infectious disease Interventional radiology  Procedures  Left total knee excision arthroplasty and placement of articulating methylmethacrylate antibiotic spacer; debridement of bone, muscle and skin 15x12cm; wound repair left knee 15cm  Antibiotics   Anti-infectives    Start     Dose/Rate Route Frequency Ordered Stop   02/10/17 1052  DAPTOmycin (CUBICIN) 420 mg in sodium chloride 0.9 % IVPB     420 mg 216.8 mL/hr over 30 Minutes Intravenous Every 48 hours 02/08/17 1255     02/09/17 1700  ciprofloxacin (CIPRO) tablet 250 mg     250 mg Oral Daily 02/09/17 1552     02/08/17 1600  vancomycin (VANCOCIN) IVPB 1000 mg/200 mL premix  Status:  Discontinued     1,000  mg 200 mL/hr over 60 Minutes Intravenous Every 48 hours 02/06/17 2214 02/07/17 1717   02/08/17 1000  DAPTOmycin (CUBICIN) 420 mg in sodium chloride 0.9 % IVPB  Status:  Discontinued     420 mg 216.8 mL/hr over 30 Minutes Intravenous Every 24 hours 02/07/17 1805 02/08/17 1255   02/06/17 1806  tobramycin (NEBCIN) powder  Status:  Discontinued       As needed 02/06/17 1841 02/06/17 1900   02/06/17 1610  vancomycin (VANCOCIN) powder  Status:  Discontinued       As needed 02/06/17 1611 02/06/17 1900   02/06/17 1458  vancomycin (VANCOCIN) 1-5 GM/200ML-% IVPB    Comments:  Schonewitz, Leigh   : cabinet override      02/06/17 1458 02/07/17 0259      Subjective:   [name] has no new complaints.  Objective:   Vitals:   02/10/17 1425 02/10/17 2100 02/11/17 0629 02/11/17 1435  BP: (!) 149/68 (!) 136/56 (!) 147/60 (!) 150/66  Pulse: 82 79 79 74  Resp: _0 Temp: 98.4 F (36.9 C) 98.7 F (37.1 C) 98.9 F (37.2 C) 98.8 F (37.1 C)  TempSrc: Oral Oral Oral Oral  SpO2: 95% 98% 100% 99%  Weight:      Height:        Intake/Output Summary (Last 24 hours) at 02/11/17 1632 Last data filed at 02/11/17 1436  Gross per 24 hour  Intake            648.4 ml  Output             2275 ml  Net          -1626.6 ml   Filed Weights   02/08/17 0547 02/09/17 0500 02/10/17 0500  Weight: 71.1  kg (156 lb 11.2 oz) 71.4 kg (157 lb 4.8 oz) 70 kg (154 lb 4.8 oz)   Exam  General: Well developed, well nourished, NAD  HEENT: NCAT, mucous membranes moist. atraumatic  Cardiovascular: S1 S2 auscultated, no rubs, murmurs or gallops. Regular rate and rhythm.  Respiratory: Clear to auscultation bilaterally with equal chest rise  Abdomen: Soft, nontender, nondistended, + bowel sounds  Extremities: warm dry without cyanosis clubbing or edema RLE. LLE with dressing with brace.   Neuro: AAOx3, nonfocal  Psych: Appropriate mood and insight, pleasant  Data Reviewed: I have personally reviewed  following labs and imaging studies  CBC:  Recent Labs Lab 02/05/17 1844  02/07/17 0238 02/08/17 0337 02/09/17 0308 02/10/17 0624 02/11/17 0503  WBC 10.3  < > 15.0* 12.2* 12.0* 10.8* 8.3  NEUTROABS 7.7  --   --   --   --   --   --   HGB 7.1*  < > 9.5* 8.5* 7.7* 9.1* 8.9*  HCT 22.1*  < > 28.9* 26.0* 24.0* 28.8* 27.1*  MCV 84.7  < > 84.5 87.2 88.2 86.7 86.9  PLT 521*  < > 391 375 357 331 334  < > = values in this interval not displayed. Basic Metabolic Panel:  Recent Labs Lab 02/05/17 1844  02/07/17 0238 02/08/17 0337 02/09/17 0308 02/10/17 0624 02/11/17 0503  NA 134*  < > 138 139 135 136 140  K 3.4*  < > 4.4 4.4 4.3 4.1 3.8  CL 101  < > 106 105 105 107 108  CO2 21*  < > 21* 22 20* 20* 21*  GLUCOSE 180*  < > 167* 101* 116* 75 83  BUN 79*  < > 72* 73* 74* 74* 66*  CREATININE 3.87*  < > 3.67* 3.74* 3.88* 3.82* 3.50*  CALCIUM 8.7*  < > 8.0* 8.2* 8.2* 8.4* 8.7*  MG 2.1  --   --   --   --   --   --   < > = values in this interval not displayed. GFR: Estimated Creatinine Clearance: 16.2 mL/min (A) (by C-G formula based on SCr of 3.5 mg/dL (H)). Liver Function Tests:  Recent Labs Lab 02/05/17 1844  AST 16  ALT 20  ALKPHOS 117  BILITOT 0.3  PROT 7.8  ALBUMIN 2.6*   No results for input(s): LIPASE, AMYLASE in the last 168 hours. No results for input(s): AMMONIA in the last 168 hours. Coagulation Profile: No results for input(s): INR, PROTIME in the last 168 hours. Cardiac Enzymes:  Recent Labs Lab 02/08/17 1536  CKTOTAL 77   BNP (last 3 results) No results for input(s): PROBNP in the last 8760 hours. HbA1C: No results for input(s): HGBA1C in the last 72 hours. CBG:  Recent Labs Lab 02/10/17 1215 02/10/17 1659 02/10/17 2124 02/11/17 0801 02/11/17 1309  GLUCAP 88 97 119* 93 133*   Lipid Profile: No results for input(s): CHOL, HDL, LDLCALC, TRIG, CHOLHDL, LDLDIRECT in the last 72 hours. Thyroid Function Tests: No results for input(s): TSH, T4TOTAL,  FREET4, T3FREE, THYROIDAB in the last 72 hours. Anemia Panel: No results for input(s): VITAMINB12, FOLATE, FERRITIN, TIBC, IRON, RETICCTPCT in the last 72 hours. Urine analysis:    Component Value Date/Time   COLORURINE AMBER (A) 02/08/2017 1138   APPEARANCEUR TURBID (A) 02/08/2017 1138   LABSPEC 1.014 02/08/2017 1138   PHURINE 5.0 02/08/2017 1138   GLUCOSEU NEGATIVE 02/08/2017 1138   HGBUR MODERATE (A) 02/08/2017 1138   BILIRUBINUR NEGATIVE 02/08/2017 1138   KETONESUR  NEGATIVE 02/08/2017 1138   PROTEINUR 100 (A) 02/08/2017 1138   UROBILINOGEN 0.2 09/02/2014 1504   NITRITE NEGATIVE 02/08/2017 1138   LEUKOCYTESUR LARGE (A) 02/08/2017 1138   Sepsis Labs: _0 (procalcitonin:4,lacticidven:4)  ) Recent Results (from the past 240 hour(s))  Body Fluid Culture     Status: None   Collection Time: 02/03/17  4:34 PM  Result Value Ref Range Status   Gram Stain Moderate  Final   Gram Stain WBC present-predominately PMN  Final   Gram Stain No Organisms Seen  Final   Organism ID, Bacteria NO GROWTH  Final  Gram stain     Status: None   Collection Time: 02/03/17  4:34 PM  Result Value Ref Range Status   Gram Stain Moderate  Final   Gram Stain WBC present-predominately PMN  Final   Gram Stain No Organisms Seen  Final  Surgical pcr screen     Status: None   Collection Time: 02/05/17  9:20 PM  Result Value Ref Range Status   MRSA, PCR NEGATIVE NEGATIVE Final   Staphylococcus aureus NEGATIVE NEGATIVE Final    Comment:        The Xpert SA Assay (FDA approved for NASAL specimens in patients over 26 years of age), is one component of a comprehensive surveillance program.  Test performance has been validated by [facility] for patients greater than or equal to 58 year old. It is not intended to diagnose infection nor to guide or monitor treatment.   Aerobic/Anaerobic Culture (surgical/deep wound)     Status: None   Collection Time: 02/06/17  4:22 PM  Result Value Ref Range  Status   Specimen Description TISSUE LEFT KNEE  Final   Special Requests NONE  Final   Gram Stain   Final    MODERATE WBC PRESENT,BOTH PMN AND MONONUCLEAR NO ORGANISMS SEEN    Culture No growth aerobically or anaerobically.  Final   Report Status 02/11/2017 FINAL  Final  Aerobic/Anaerobic Culture (surgical/deep wound)     Status: None (Preliminary result)   Collection Time: 02/06/17  5:01 PM  Result Value Ref Range Status   Specimen Description TISSUE  Final   Special Requests LEFT FEMORAL CANNEL  Final   Gram Stain   Final    FEW WBC PRESENT, PREDOMINANTLY MONONUCLEAR NO ORGANISMS SEEN    Culture HOLDING FOR POSSIBLE ANAEROBE  Final   Report Status PENDING  Incomplete  Culture, blood (routine x 2)     Status: None (Preliminary result)   Collection Time: 02/07/17  8:30 AM  Result Value Ref Range Status   Specimen Description BLOOD RIGHT HAND  Final   Special Requests IN PEDIATRIC BOTTLE Blood Culture adequate volume  Final   Culture NO GROWTH 4 DAYS  Final   Report Status PENDING  Incomplete  Culture, blood (routine x 2)     Status: None (Preliminary result)   Collection Time: 02/07/17  8:38 AM  Result Value Ref Range Status   Specimen Description BLOOD RIGHT ARM  Final   Special Requests IN PEDIATRIC BOTTLE Blood Culture adequate volume  Final   Culture NO GROWTH 4 DAYS  Final   Report Status PENDING  Incomplete  Culture, Urine     Status: Abnormal   Collection Time: 02/08/17 11:38 AM  Result Value Ref Range Status   Specimen Description URINE, RANDOM  Final   Special Requests CX ADDED AT 0327 ON 389373  Final   Culture >=100,000 COLONIES/mL YEAST (A)  Final   Report Status  02/10/2017 FINAL  Final      Radiology Studies: Ir Fluoro Guide Cv Line Right  Result Date: 02/10/2017 INDICATION: KNEE INFECTION, ACCESS FOR LONG-TERM IV ANTIBIOTICS, ELEVATED CREATININE EXAM: ULTRASOUND AND FLUOROSCOPIC GUIDED RIGHT IJ SINGLE-LUMEN TUNNELED PICC LINE INSERTION MEDICATIONS: 1%  lidocaine local CONTRAST:  None FLUOROSCOPY TIME:  Twelve seconds (1.0 mGy) COMPLICATIONS: None immediate. TECHNIQUE: The procedure, risks, benefits, and alternatives were explained to the patient and informed written consent was obtained. A timeout was performed prior to the initiation of the procedure. The right neck and chest were prepped with chlorhexidine in a sterile fashion, and a sterile drape was applied covering the operative field. Maximum barrier sterile technique with sterile gowns and gloves were used for the procedure. A timeout was performed prior to the initiation of the procedure. Local anesthesia was provided with 1% lidocaine. Under direct ultrasound guidance, the right internal jugular vein was accessed with a micropuncture kit after the overlying soft tissues were anesthetized with 1% lidocaine. An ultrasound image was saved for documentation purposes. A guidewire was advanced to the level of the superior caval-atrial junction for measurement purposes and the PICC line was cut to length. In the right chest, a subcutaneous tunnel was created under sterile conditions and local anesthesia. The single-lumen power cuffed PICC line was tunneled subcutaneously to the venotomy site. A peel-away sheath was placed and a 16 cm, 5 [place], single lumen was inserted to level of the superior caval-atrial junction. A post procedure spot fluoroscopic was obtained. The catheter easily aspirated and flushed and was sutured in place. A dressing was placed. The patient tolerated the procedure well without immediate post procedural complication. FINDINGS: After catheter placement, the tip lies within the superior cavoatrial junction. The catheter aspirates and flushes normally and is ready for immediate use. IMPRESSION: Successful ultrasound and fluoroscopic guided placement of a right IJ vein approach, 16 cm, 5 [place], single lumen tunneled PICC with tip at the superior caval-atrial junction. The PICC line is ready  for immediate use. Electronically Signed   By: Jerilynn Mages.  Shick M.D.   On: 02/10/2017 14:13   Ir US Guide Vasc Access Right  Result Date: 02/10/2017 INDICATION: KNEE INFECTION, ACCESS FOR LONG-TERM IV ANTIBIOTICS, ELEVATED CREATININE EXAM: ULTRASOUND AND FLUOROSCOPIC GUIDED RIGHT IJ SINGLE-LUMEN TUNNELED PICC LINE INSERTION MEDICATIONS: 1% lidocaine local CONTRAST:  None FLUOROSCOPY TIME:  Twelve seconds (1.0 mGy) COMPLICATIONS: None immediate. TECHNIQUE: The procedure, risks, benefits, and alternatives were explained to the patient and informed written consent was obtained. A timeout was performed prior to the initiation of the procedure. The right neck and chest were prepped with chlorhexidine in a sterile fashion, and a sterile drape was applied covering the operative field. Maximum barrier sterile technique with sterile gowns and gloves were used for the procedure. A timeout was performed prior to the initiation of the procedure. Local anesthesia was provided with 1% lidocaine. Under direct ultrasound guidance, the right internal jugular vein was accessed with a micropuncture kit after the overlying soft tissues were anesthetized with 1% lidocaine. An ultrasound image was saved for documentation purposes. A guidewire was advanced to the level of the superior caval-atrial junction for measurement purposes and the PICC line was cut to length. In the right chest, a subcutaneous tunnel was created under sterile conditions and local anesthesia. The single-lumen power cuffed PICC line was tunneled subcutaneously to the venotomy site. A peel-away sheath was placed and a 16 cm, 5 [place], single lumen was inserted to level of the superior caval-atrial junction.  A post procedure spot fluoroscopic was obtained. The catheter easily aspirated and flushed and was sutured in place. A dressing was placed. The patient tolerated the procedure well without immediate post procedural complication. FINDINGS: After catheter placement,  the tip lies within the superior cavoatrial junction. The catheter aspirates and flushes normally and is ready for immediate use. IMPRESSION: Successful ultrasound and fluoroscopic guided placement of a right IJ vein approach, 16 cm, 5 [place], single lumen tunneled PICC with tip at the superior caval-atrial junction. The PICC line is ready for immediate use. Electronically Signed   By: Jerilynn Mages.  Shick M.D.   On: 02/10/2017 14:13     Scheduled Meds: . calcitRIOL  0.25 mcg Oral Q M,W,F  . ciprofloxacin  250 mg Oral Daily  . enoxaparin (LOVENOX) injection  30 mg Subcutaneous Daily  . ferrous sulfate  325 mg Oral BID WC  . furosemide  80 mg Oral BID  . gabapentin  100 mg Oral TID  . hydrALAZINE  25 mg Oral BID  . insulin aspart  0-9 Units Subcutaneous TID WC  . insulin glargine  15 Units Subcutaneous QAC breakfast  . mouth rinse  15 mL Mouth Rinse BID  . methocarbamol  500 mg Oral BID  . multivitamin with minerals  1 tablet Oral Daily  . nicotine  14 mg Transdermal Daily  . pantoprazole  40 mg Oral Daily  . senna-docusate  2 tablet Oral QHS  . sertraline  100 mg Oral Daily   Continuous Infusions: . DAPTOmycin (CUBICIN)  IV Stopped (02/10/17 1142)     LOS: 6 days   Time Spent in minutes   35 minutes  Emrys Mceachron D.O. on 02/11/2017 at 4:32 PM  Between 7am to 7pm - Pager - [phone]  After 7pm go to [email or web address] - password [email or web address]  And look for the night coverage person covering for me after hours  [facility] Office  [phone]

## 2017-02-11 NOTE — Progress Notes (Signed)
Occupational Therapy Treatment Patient Details Name: Brandy Houston MRN: [id number] DOB: 01/28/1956 Today's Date: 02/11/2017    History of present illness 61 y.o. 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 Presented to the hospital from Dr. [name] office (ortho) status post aspiration with elevated WBC. ADmitted with septic knee. 8/24 Pt s/p Lt TKA excision arthroplasty with spacer placed   OT comments  Pt making progress with functional goals, OT will continue to follow acutely  Follow Up Recommendations  Home health OT;Supervision/Assistance - 24 hour    Equipment Recommendations  None recommended by OT    Recommendations for Other Services      Precautions / Restrictions Precautions Precautions: Fall Restrictions Weight Bearing Restrictions: Yes LLE Weight Bearing: Touchdown weight bearing       Mobility Bed Mobility Overal bed mobility: Needs Assistance Bed Mobility: Sit to Supine     Supine to sit: Min assist     General bed mobility comments: MinA for LLE management   Transfers Overall transfer level: Needs assistance   Transfers: Sit to/from Stand;Stand Pivot Transfers Sit to Stand: Min assist         General transfer comment: cues for hand placement    Balance Overall balance assessment: Needs assistance   Sitting balance-Leahy Scale: Good     Standing balance support: Bilateral upper extremity supported Standing balance-Leahy Scale: Poor Standing balance comment: Pt stood at [facility] for nurse tech to bathe posterior                           ADL either performed or assessed with clinical judgement   ADL Overall ADL's : Needs assistance/impaired     Grooming: Wash/dry hands;Min guard;Standing       Lower Body Bathing: Sit to/from stand;Total assistance Lower Body Bathing Details (indicate cue type and reason): pt stood at [facility] for nurse tech to  bathe posterior with OT assisting with balance         Toilet Transfer: Minimal assistance;Stand-pivot;BSC;RW   Toileting- Clothing Manipulation and Hygiene: Moderate assistance;Sit to/from stand       Functional mobility during ADLs: Minimal assistance;Rolling walker       Vision Patient Visual Report: No change from baseline     Perception     Praxis      Cognition Arousal/Alertness: Awake/alert;Lethargic Behavior During Therapy: Flat affect Overall Cognitive Status: No family/caregiver present to determine baseline cognitive functioning                                                      General Comments  pt pleasant and cooperative    Pertinent Vitals/ Pain       Pain Assessment: 0-10 Pain Score: 5  Pain Location: left knee Pain Descriptors / Indicators: Sore Pain Intervention(s): Monitored during session;Repositioned  Home Living                                          Prior Functioning/Environment              Frequency  Min 2X/week        Progress Toward Goals  OT Goals(current  goals can now be found in the care plan section)  Progress towards OT goals: Progressing toward goals     Plan Discharge plan remains appropriate    Co-evaluation                 AM-PAC PT "6 Clicks" Daily Activity     Outcome Measure   Help from another person eating meals?: None Help from another person taking care of personal grooming?: A Little Help from another person toileting, which includes using toliet, bedpan, or urinal?: A Lot Help from another person bathing (including washing, rinsing, drying)?: A Lot Help from another person to put on and taking off regular upper body clothing?: A Little Help from another person to put on and taking off regular lower body clothing?: A Lot 6 Click Score: 16    End of Session Equipment Utilized During Treatment: Gait belt;Rolling walker;Other (comment) (BSC)  OT  Visit Diagnosis: Unsteadiness on feet (R26.81);Muscle weakness (generalized) (M62.81)   Activity Tolerance Patient tolerated treatment well   Patient Left with call bell/phone within reach;with nursing/sitter in room;in chair;with chair alarm set   Nurse Communication      Functional Assessment Tool Used: AM-PAC 6 Clicks Daily Activity   Time: 5189-8421 OT Time Calculation (min): 13 min  Charges: OT G-codes **NOT FOR INPATIENT CLASS** Functional Assessment Tool Used: AM-PAC 6 Clicks Daily Activity OT General Charges $OT Visit: 1 Visit OT Treatments $Therapeutic Activity: 8-22 mins     Britt Bottom 02/11/2017, 1:15 PM

## 2017-02-11 NOTE — Progress Notes (Signed)
Brief Nutrition Follow-Up Note  Chart reviewed. Pt s/p incisional total knee with antibiotic spacer on 02/08/17. Pt has been advanced to a renal/carb modified diet; meal completion 100%.   Per RNCM note, plan to discharge home on IV antibiotics; s/p PICC by IR on 02/10/17.   No further nutritional concerns at this time. If nutrition needs arise, please re-consult RD.   Lue Sykora A. Jimmye Norman, RD, LDN, CDE Pager: [phone] After hours Pager: [phone]

## 2017-02-11 NOTE — Progress Notes (Signed)
Patient ID: [name], female   DOB: Dec 07, 1955, 61 y.o.   MRN: [id number] [facility] Progress Note   Assessment/ Plan:   1. 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 Improved renal function with fair UOP on lasix. Her left forearm graft appears to be thrombosed at this time but unfortunately because she has acute renal insufficiency, would not want her to be subjected to thrombectomy/iodinated intravenous contrast exposure at this time. May attempt graft thrombectomy after renal recovery. 2. Septic prosthetic left knee: Status post excision of prosthesis with antibiotic impregnated spacer. On Daptomycin with planned f/u with I.D and orthopedic surgery. 3. Hypertension: BP intermittently elevated-. 4. Anemia: stable H/H s/p PRBCs. Continue to monitor for PRBC needs.   Subjective:   Reports to be feeling better and concerned about going home with limited assistance there.    Objective:   BP (!) 147/60 (BP Location: Right Arm)   Pulse 79   Temp 98.9 F (37.2 C) (Oral)   Resp 16   Ht _0  (1.702 m)   Wt 70 kg (154 lb 4.8 oz)   LMP 03/16/2002   SpO2 100%   BMI 24.17 kg/m   Intake/Output Summary (Last 24 hours) at 02/11/17 1135 Last data filed at 02/11/17 0636  Gross per 24 hour  Intake            888.4 ml  Output             1775 ml  Net           -886.6 ml   Weight change:   Physical Exam: NWG:[id number] to be comfortable resting in bed QMV:HQION regular rhythm, normal rate, S1 and S2 normal Resp:clear to auscultation, no rales/rhonchi GEX:BMWU, flat, nontender XLK:GMWN leg in supportive soft dressing, right leg no edema. No palpable thrill or audible bruit left forearm loop graft.  Imaging: Ir Fluoro Guide Cv Line  Right  Result Date: 02/10/2017 INDICATION: KNEE INFECTION, ACCESS FOR LONG-TERM IV ANTIBIOTICS, ELEVATED CREATININE EXAM: ULTRASOUND AND FLUOROSCOPIC GUIDED RIGHT IJ SINGLE-LUMEN TUNNELED PICC LINE INSERTION MEDICATIONS: 1% lidocaine local CONTRAST:  None FLUOROSCOPY TIME:  Twelve seconds (1.0 mGy) COMPLICATIONS: None immediate. TECHNIQUE: The procedure, risks, benefits, and alternatives were explained to the patient and informed written consent was obtained. A timeout was performed prior to the initiation of the procedure. The right neck and chest were prepped with chlorhexidine in a sterile fashion, and a sterile drape was applied covering the operative field. Maximum barrier sterile technique with sterile gowns and gloves were used for the procedure. A timeout was performed prior to the initiation of the procedure. Local anesthesia was provided with 1% lidocaine. Under direct ultrasound guidance, the right internal jugular vein was accessed with a micropuncture kit after the overlying soft tissues were anesthetized with 1% lidocaine. An ultrasound image was saved for documentation purposes. A guidewire was advanced to the level of the superior caval-atrial junction for measurement purposes and the PICC line was cut to length. In the right chest, a subcutaneous tunnel was created under sterile conditions and local anesthesia. The single-lumen power cuffed PICC line was tunneled subcutaneously to the venotomy site. A peel-away sheath was placed and a 16 cm, 5 [place], single lumen was inserted to level of the superior caval-atrial junction. A post procedure spot fluoroscopic was obtained. The catheter  easily aspirated and flushed and was sutured in place. A dressing was placed. The patient tolerated the procedure well without immediate post procedural complication. FINDINGS: After catheter placement, the tip lies within the superior cavoatrial junction. The catheter aspirates and flushes normally and is ready for  immediate use. IMPRESSION: Successful ultrasound and fluoroscopic guided placement of a right IJ vein approach, 16 cm, 5 [place], single lumen tunneled PICC with tip at the superior caval-atrial junction. The PICC line is ready for immediate use. Electronically Signed   By: Jerilynn Mages.  Shick M.D.   On: 02/10/2017 14:13   Ir US Guide Vasc Access Right  Result Date: 02/10/2017 INDICATION: KNEE INFECTION, ACCESS FOR LONG-TERM IV ANTIBIOTICS, ELEVATED CREATININE EXAM: ULTRASOUND AND FLUOROSCOPIC GUIDED RIGHT IJ SINGLE-LUMEN TUNNELED PICC LINE INSERTION MEDICATIONS: 1% lidocaine local CONTRAST:  None FLUOROSCOPY TIME:  Twelve seconds (1.0 mGy) COMPLICATIONS: None immediate. TECHNIQUE: The procedure, risks, benefits, and alternatives were explained to the patient and informed written consent was obtained. A timeout was performed prior to the initiation of the procedure. The right neck and chest were prepped with chlorhexidine in a sterile fashion, and a sterile drape was applied covering the operative field. Maximum barrier sterile technique with sterile gowns and gloves were used for the procedure. A timeout was performed prior to the initiation of the procedure. Local anesthesia was provided with 1% lidocaine. Under direct ultrasound guidance, the right internal jugular vein was accessed with a micropuncture kit after the overlying soft tissues were anesthetized with 1% lidocaine. An ultrasound image was saved for documentation purposes. A guidewire was advanced to the level of the superior caval-atrial junction for measurement purposes and the PICC line was cut to length. In the right chest, a subcutaneous tunnel was created under sterile conditions and local anesthesia. The single-lumen power cuffed PICC line was tunneled subcutaneously to the venotomy site. A peel-away sheath was placed and a 16 cm, 5 [place], single lumen was inserted to level of the superior caval-atrial junction. A post procedure spot fluoroscopic was  obtained. The catheter easily aspirated and flushed and was sutured in place. A dressing was placed. The patient tolerated the procedure well without immediate post procedural complication. FINDINGS: After catheter placement, the tip lies within the superior cavoatrial junction. The catheter aspirates and flushes normally and is ready for immediate use. IMPRESSION: Successful ultrasound and fluoroscopic guided placement of a right IJ vein approach, 16 cm, 5 [place], single lumen tunneled PICC with tip at the superior caval-atrial junction. The PICC line is ready for immediate use. Electronically Signed   By: Jerilynn Mages.  Shick M.D.   On: 02/10/2017 14:13    Labs: BMET  Recent Labs Lab 02/05/17 1844 02/06/17 0815 02/06/17 1754 02/07/17 0238 02/08/17 0337 02/09/17 0308 02/10/17 0624 02/11/17 0503  NA 134* 137 140 138 139 135 136 140  K 3.4* 3.6 3.7 4.4 4.4 4.3 4.1 3.8  CL 101 103  --  106 105 105 107 108  CO2 21* 21*  --  21* 22 20* 20* 21*  GLUCOSE 180* 88 135* 167* 101* 116* 75 83  BUN 79* 77*  --  72* 73* 74* 74* 66*  CREATININE 3.87* 3.66*  --  3.67* 3.74* 3.88* 3.82* 3.50*  CALCIUM 8.7* 8.8*  --  8.0* 8.2* 8.2* 8.4* 8.7*   CBC  Recent Labs Lab 02/05/17 1844  02/08/17 0337 02/09/17 0308 02/10/17 0624 02/11/17 0503  WBC 10.3  < > 12.2* 12.0* 10.8* 8.3  NEUTROABS 7.7  --   --   --   --   --  HGB 7.1*  < > 8.5* 7.7* 9.1* 8.9*  HCT 22.1*  < > 26.0* 24.0* 28.8* 27.1*  MCV 84.7  < > 87.2 88.2 86.7 86.9  PLT 521*  < > 375 357 331 334  < > = values in this interval not displayed.  Medications:    . calcitRIOL  0.25 mcg Oral Q M,W,F  . ciprofloxacin  250 mg Oral Daily  . enoxaparin (LOVENOX) injection  30 mg Subcutaneous Daily  . ferrous sulfate  325 mg Oral BID WC  . furosemide  80 mg Oral BID  . gabapentin  100 mg Oral TID  . hydrALAZINE  25 mg Oral BID  . insulin aspart  0-9 Units Subcutaneous TID WC  . insulin glargine  15 Units Subcutaneous QAC breakfast  . mouth rinse  15  mL Mouth Rinse BID  . methocarbamol  500 mg Oral BID  . multivitamin with minerals  1 tablet Oral Daily  . nicotine  14 mg Transdermal Daily  . pantoprazole  40 mg Oral Daily  . senna-docusate  2 tablet Oral QHS  . sertraline  100 mg Oral Daily   Elmarie Shiley, MD 02/11/2017, 11:35 AM

## 2017-02-12 LAB — CULTURE, BLOOD (ROUTINE X 2)
Culture: NO GROWTH
Culture: NO GROWTH
Special Requests: ADEQUATE
Special Requests: ADEQUATE

## 2017-02-12 LAB — RENAL FUNCTION PANEL
Albumin: 2 g/dL — ABNORMAL LOW (ref 3.5–5.0)
Anion gap: 8 (ref 5–15)
BUN: 59 mg/dL — ABNORMAL HIGH (ref 6–20)
CO2: 22 mmol/L (ref 22–32)
Calcium: 8.7 mg/dL — ABNORMAL LOW (ref 8.9–10.3)
Chloride: 107 mmol/L (ref 101–111)
Creatinine, Ser: 3.28 mg/dL — ABNORMAL HIGH (ref 0.44–1.00)
GFR calc Af Amer: 16 mL/min — ABNORMAL LOW (ref >60)
GFR calc non Af Amer: 14 mL/min — ABNORMAL LOW (ref >60)
Glucose, Bld: 96 mg/dL (ref 65–99)
Phosphorus: 5 mg/dL — ABNORMAL HIGH (ref 2.5–4.6)
Potassium: 3.9 mmol/L (ref 3.5–5.1)
Sodium: 137 mmol/L (ref 135–145)

## 2017-02-12 LAB — GLUCOSE, CAPILLARY
Glucose-Capillary: 114 mg/dL — ABNORMAL HIGH (ref 65–99)
Glucose-Capillary: 190 mg/dL — ABNORMAL HIGH (ref 65–99)
Glucose-Capillary: 93 mg/dL (ref 65–99)
Glucose-Capillary: 118 mg/dL — ABNORMAL HIGH (ref 65–99)

## 2017-02-12 MED ORDER — AMLODIPINE BESYLATE 10 MG PO TABS
10.0000 mg | ORAL_TABLET | Freq: Every day | ORAL | Status: DC
  Administered 2017-02-12 – 2017-02-17 (×6): 10 mg via ORAL
  Filled 2017-02-12 (×6): qty 1

## 2017-02-12 MED ORDER — HYDRALAZINE HCL 50 MG PO TABS
100.0000 mg | ORAL_TABLET | Freq: Two times a day (BID) | ORAL | Status: DC
  Administered 2017-02-12 – 2017-02-18 (×12): 100 mg via ORAL
  Filled 2017-02-12 (×12): qty 2

## 2017-02-12 MED ORDER — METOPROLOL SUCCINATE ER 50 MG PO TB24
50.0000 mg | ORAL_TABLET | Freq: Every day | ORAL | Status: DC
  Administered 2017-02-12 – 2017-02-18 (×7): 50 mg via ORAL
  Filled 2017-02-12 (×7): qty 1

## 2017-02-12 NOTE — Progress Notes (Addendum)
Patient ID: [name], female   DOB: 1955-10-23, 61 y.o.   MRN: [id number] [facility] Progress Note   Assessment/ Plan:   1. 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 Renal function continues to show slow signs of recovery with good urine output augmented by ongoing furosemide use. She is euvolemic with stable electrolytes. She does not have any concerning signs or symptoms to prompt intervention at this point. Unfortunately, her left forearm loop graft is thrombosed and it is not recommended to subject her to contrast exposure at this time for thrombectomy. May attempt graft thrombectomy after renal recovery. 2. Septic prosthetic left knee: Status post excision of prosthesis with antibiotic impregnated spacer. On Daptomycin with planned f/u with I.D and orthopedic surgery. 3. Hypertension: BP intermittently elevated- will restart amlodipine 10 mg daily, hydralazine 100 mg twice a day and metoprolol XL 50 mg once a day along with the lower dose of furosemide. At this time, will hold off on metolazone and increasing her furosemide to 160 mg twice a day. Apparently was also taking metoprolol XL 50 mg twice a day.  4. Anemia: stable H/H s/p PRBCs. Continue to monitor for PRBC needs.   We'll sign off at this time-please call with questions/concerns. We will schedule follow-up with Dr. [name] in the clinic.  Subjective:    Reports to be feeling better except for some itching over left knee/immobilizer..    Objective:   BP (!) 157/66   Pulse 81   Temp 97.9 F (36.6 C)   Resp 17   Ht '5\' 7"'  (1.702 m)   Wt 70.8 kg (156 lb)   LMP 03/16/2002   SpO2 99%   BMI 24.43 kg/m   Intake/Output Summary (Last 24 hours) at 02/12/17 1027 Last data filed at 02/11/17 2225  Gross per 24 hour  Intake                0 ml  Output             1250 ml  Net            -1250 ml    Weight change:   Physical Exam: SHF:[id number] to be comfortable resting in bed YIF:OYDXA regular rhythm, normal rate, S1 and S2 normal Resp:clear to auscultation, no rales/rhonchi JOI:NOMV, flat, nontender EHM:CNOB leg in supportive soft dressing, right leg no edema. No palpable thrill or audible bruit left forearm loop graft.  Imaging: Ir Fluoro Guide Cv Line Right  Result Date: 02/10/2017 INDICATION: KNEE INFECTION, ACCESS FOR LONG-TERM IV ANTIBIOTICS, ELEVATED CREATININE EXAM: ULTRASOUND AND FLUOROSCOPIC GUIDED RIGHT IJ SINGLE-LUMEN TUNNELED PICC LINE INSERTION MEDICATIONS: 1% lidocaine local CONTRAST:  None FLUOROSCOPY TIME:  Twelve seconds (1.0 mGy) COMPLICATIONS: None immediate. TECHNIQUE: The procedure, risks, benefits, and alternatives were explained to the patient and informed written consent was obtained. A timeout was performed prior to the initiation of the procedure. The right neck and chest were prepped with chlorhexidine in a sterile fashion, and a sterile drape was applied covering the operative field. Maximum barrier sterile technique with sterile gowns and gloves were used for the procedure. A timeout was performed prior to the initiation of the procedure. Local anesthesia was provided with 1% lidocaine. Under direct ultrasound guidance, the right internal jugular vein was accessed with a micropuncture kit after the overlying soft tissues were anesthetized with 1% lidocaine. An ultrasound image was saved for documentation  purposes. A guidewire was advanced to the level of the superior caval-atrial junction for measurement purposes and the PICC line was cut to length. In the right chest, a subcutaneous tunnel was created under sterile conditions and local anesthesia. The single-lumen power cuffed PICC line was tunneled subcutaneously to the venotomy site. A peel-away sheath was placed and a 16 cm, 5 [place], single lumen was inserted to level of the superior caval-atrial junction. A  post procedure spot fluoroscopic was obtained. The catheter easily aspirated and flushed and was sutured in place. A dressing was placed. The patient tolerated the procedure well without immediate post procedural complication. FINDINGS: After catheter placement, the tip lies within the superior cavoatrial junction. The catheter aspirates and flushes normally and is ready for immediate use. IMPRESSION: Successful ultrasound and fluoroscopic guided placement of a right IJ vein approach, 16 cm, 5 [place], single lumen tunneled PICC with tip at the superior caval-atrial junction. The PICC line is ready for immediate use. Electronically Signed   By: Jerilynn Mages.  Shick M.D.   On: 02/10/2017 14:13   Ir US Guide Vasc Access Right  Result Date: 02/10/2017 INDICATION: KNEE INFECTION, ACCESS FOR LONG-TERM IV ANTIBIOTICS, ELEVATED CREATININE EXAM: ULTRASOUND AND FLUOROSCOPIC GUIDED RIGHT IJ SINGLE-LUMEN TUNNELED PICC LINE INSERTION MEDICATIONS: 1% lidocaine local CONTRAST:  None FLUOROSCOPY TIME:  Twelve seconds (1.0 mGy) COMPLICATIONS: None immediate. TECHNIQUE: The procedure, risks, benefits, and alternatives were explained to the patient and informed written consent was obtained. A timeout was performed prior to the initiation of the procedure. The right neck and chest were prepped with chlorhexidine in a sterile fashion, and a sterile drape was applied covering the operative field. Maximum barrier sterile technique with sterile gowns and gloves were used for the procedure. A timeout was performed prior to the initiation of the procedure. Local anesthesia was provided with 1% lidocaine. Under direct ultrasound guidance, the right internal jugular vein was accessed with a micropuncture kit after the overlying soft tissues were anesthetized with 1% lidocaine. An ultrasound image was saved for documentation purposes. A guidewire was advanced to the level of the superior caval-atrial junction for measurement purposes and the PICC line  was cut to length. In the right chest, a subcutaneous tunnel was created under sterile conditions and local anesthesia. The single-lumen power cuffed PICC line was tunneled subcutaneously to the venotomy site. A peel-away sheath was placed and a 16 cm, 5 [place], single lumen was inserted to level of the superior caval-atrial junction. A post procedure spot fluoroscopic was obtained. The catheter easily aspirated and flushed and was sutured in place. A dressing was placed. The patient tolerated the procedure well without immediate post procedural complication. FINDINGS: After catheter placement, the tip lies within the superior cavoatrial junction. The catheter aspirates and flushes normally and is ready for immediate use. IMPRESSION: Successful ultrasound and fluoroscopic guided placement of a right IJ vein approach, 16 cm, 5 [place], single lumen tunneled PICC with tip at the superior caval-atrial junction. The PICC line is ready for immediate use. Electronically Signed   By: Jerilynn Mages.  Shick M.D.   On: 02/10/2017 14:13    Labs: BMET  Recent Labs Lab 02/06/17 0815 02/06/17 1754 02/07/17 0238 02/08/17 0981 02/09/17 0308 02/10/17 0624 02/11/17 0503 02/12/17 0340  NA 137 140 138 139 135 136 140 137  K 3.6 3.7 4.4 4.4 4.3 4.1 3.8 3.9  CL 103  --  106 105 105 107 108 107  CO2 21*  --  21* 22 20* 20* 21* 22  GLUCOSE 88 135* 167* 101* 116* 75 83 96  BUN 77*  --  72* 73* 74* 74* 66* 59*  CREATININE 3.66*  --  3.67* 3.74* 3.88* 3.82* 3.50* 3.28*  CALCIUM 8.8*  --  8.0* 8.2* 8.2* 8.4* 8.7* 8.7*  PHOS  --   --   --   --   --   --   --  5.0*   CBC  Recent Labs Lab 02/05/17 1844  02/08/17 0337 02/09/17 0308 02/10/17 0624 02/11/17 0503  WBC 10.3  < > 12.2* 12.0* 10.8* 8.3  NEUTROABS 7.7  --   --   --   --   --   HGB 7.1*  < > 8.5* 7.7* 9.1* 8.9*  HCT 22.1*  < > 26.0* 24.0* 28.8* 27.1*  MCV 84.7  < > 87.2 88.2 86.7 86.9  PLT 521*  < > 375 357 331 334  < > = values in this interval not  displayed.  Medications:    . calcitRIOL  0.25 mcg Oral Q M,W,F  . ciprofloxacin  250 mg Oral Daily  . enoxaparin (LOVENOX) injection  30 mg Subcutaneous Daily  . ferrous sulfate  325 mg Oral BID WC  . furosemide  80 mg Oral BID  . gabapentin  100 mg Oral TID  . hydrALAZINE  25 mg Oral BID  . insulin aspart  0-9 Units Subcutaneous TID WC  . insulin glargine  15 Units Subcutaneous QAC breakfast  . mouth rinse  15 mL Mouth Rinse BID  . methocarbamol  500 mg Oral BID  . multivitamin with minerals  1 tablet Oral Daily  . nicotine  14 mg Transdermal Daily  . pantoprazole  40 mg Oral Daily  . senna-docusate  2 tablet Oral QHS  . sertraline  100 mg Oral Daily   Elmarie Shiley, MD 02/12/2017, 10:27 AM

## 2017-02-12 NOTE — Progress Notes (Signed)
PROGRESS NOTE    Brandy Houston  UVO:[id number] DOB: [birth year] DOA: 02/05/2017 PCP: [name], MD   No chief complaint on file.  Brief Narrative:  HPI On 02/05/2017 Brandy Houston is a 61 y.o. female with a medical history of diastolic heart failure, chronic kidney disease, diabetes, hypertension, septic arthritis, presented to the hospital from Dr. [name] office (ortho). Patient presented to his office on 02/03/2017 for left knee pain, status post aspiration. Fluid showed greater than 58,000 WBC. Patient states over the past several months, she's been having problems walking and her knee has been popping out of place. Currently patient denies any chest pain, shortness breath, abdominal pain, nausea vomiting, diarrhea or constipation, dizziness or headache. She denies any recent trauma or falls. Denies any recent travel or ill contacts. Patient has been seeing a hematologist for hemarthrosis and has had several bloody knee effusions. Per documentation, unlikely to be coagulopathy. Patient has not been on any antibiotics recently.   Interim history Underwent left total knee excision with placement of antibiotic spacer. Currently on daptomycin and ciprofloxacin. 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 [name] -s/p aspiration yielding 120 mL's of bloody fluid (on 02/03/2017) -Fluid shows 58,000 WBC. Gram stain showed no organisms -s/p left total knee excision arthroplasty and placement of articulating methylmethacrylate antibiotic spacer; debridement of bone, muscle and skin 15x12cm; wound repair left knee 15cm -PT/OT consulted-  Has recommended home health -Continue supportive therapy -Wound cultures show no growth to date -Blood cultures show no growth to date -obtained CK level 77 -Infectious disease consultation appreciated to aid with antibiotics -Continue  daptomycin and ciprofloxacin, will need IV daptomycin for 6 weeks per infectious disease recommendations -Central line in place  Fever  - Resolved.  Leukocytosis  -WBC improving today to 10.8 -likely reactive to recent surgery vs possible knee infection -Currently on daptomycin and cipro -Blood cultures show no growth to date -CXR unremarkable for infection -UA TNTC WBC, large leukocyte, few bacteria (patient not complaining of any urinary symptoms) -Urine culture>100k yeast, patient has no complaints of urinary symptoms  -wound culture shows no growth to date -Continue to monitor CBC  Chronic diastolic heart failure -Echocardiogram February 2016 showed a grade 2 diastolic dysfunction -Currently patient appears to be euvolemic and compensated -Takes metolazone and Lasix at home- currently held due to AKI -Patient was given IV lasix between blood transfusions to avoid volume overload on admission -will give additional dose of lasix today as patient will receive 2uPRBC -Monitor intake and output, daily weights -urine output over past 24hrs 1050cc  Acute kidney injury vs Chronic kidney disease, stage IV - Serum creatinine continues to improve. Will continue to monitor.  Diabetes mellitus, type II with neuropathy -Continue Lantus, insulin sliding scale was CBG monitoring -Hemoglobin A1c 6.2 -Continue gabapentin for neuropathy  Essential hypertension -Continue hydralazine -metoprolol and amlodipine discontinued  Depression -Continue Zoloft  Tobacco abuse -Smoking cessation discussed -Continue Nicotine patch  Anemia of chronic disease/ hemarthrosis/ Anemia due to blood loss -Hemoglobin was 7.1 on admission (baseline hemoglobin 9 over the past year) -Has had a history of hemarthrosis and was seeing oncology. Patient was receiving Aranesp injections every 30 days. -Patient denies any complaints of melena or hematochezia -Recently had anemia panel on 02/02/2017 which  showed iron of 18, ferritin of 52 -Given 7uPRBC since admission -Hemoglobin currently 9.1 -Continue iron  supplementation  -Continue to monitor CBC  Hepatitis C -Per patient, stable -Continue outpatient follow up  DVT Prophylaxis  SCDs/lovenox  Code Status: Full  Family Communication: None at bedside.   Disposition Plan: With improvement in condition  Consultants Orthopedic surgery, Dr. [name] Nephrology Infectious disease Interventional radiology  Procedures  Left total knee excision arthroplasty and placement of articulating methylmethacrylate antibiotic spacer; debridement of bone, muscle and skin 15x12cm; wound repair left knee 15cm  Antibiotics   Anti-infectives    Start     Dose/Rate Route Frequency Ordered Stop   02/10/17 1052  DAPTOmycin (CUBICIN) 420 mg in sodium chloride 0.9 % IVPB     420 mg 216.8 mL/hr over 30 Minutes Intravenous Every 48 hours 02/08/17 1255     02/09/17 1700  ciprofloxacin (CIPRO) tablet 250 mg     250 mg Oral Daily 02/09/17 1552     02/08/17 1600  vancomycin (VANCOCIN) IVPB 1000 mg/200 mL premix  Status:  Discontinued     1,000 mg 200 mL/hr over 60 Minutes Intravenous Every 48 hours 02/06/17 2214 02/07/17 1717   02/08/17 1000  DAPTOmycin (CUBICIN) 420 mg in sodium chloride 0.9 % IVPB  Status:  Discontinued     420 mg 216.8 mL/hr over 30 Minutes Intravenous Every 24 hours 02/07/17 1805 02/08/17 1255   02/06/17 1806  tobramycin (NEBCIN) powder  Status:  Discontinued       As needed 02/06/17 1841 02/06/17 1900   02/06/17 1610  vancomycin (VANCOCIN) powder  Status:  Discontinued       As needed 02/06/17 1611 02/06/17 1900   02/06/17 1458  vancomycin (VANCOCIN) 1-5 GM/200ML-% IVPB    Comments:  Schonewitz, Leigh   : cabinet override      02/06/17 1458 02/07/17 0259      Subjective:   Brandy Houston  Pt reports no new problems today.  Objective:   Vitals:   02/11/17 2225 02/12/17 0500 02/12/17 0515 02/12/17 1420  BP: (!) 153/64  (!) 157/66  (!) 164/77  Pulse: 74  81 72  Resp: 17  17 17   Temp: 99 F (37.2 C)  97.9 F (36.6 C) 99.1 F (37.3 C)  TempSrc: Oral   Oral  SpO2: 99%  99% 99%  Weight:  70.8 kg (156 lb)    Height:        Intake/Output Summary (Last 24 hours) at 02/12/17 1704 Last data filed at 02/12/17 1302  Gross per 24 hour  Intake            108.4 ml  Output              500 ml  Net           -391.6 ml   Filed Weights   02/09/17 0500 02/10/17 0500 02/12/17 0500  Weight: 71.4 kg (157 lb 4.8 oz) 70 kg (154 lb 4.8 oz) 70.8 kg (156 lb)   ExamExam unchanged when compared to 02/11/2014  General: Well developed, well nourished, NAD  HEENT: NCAT, mucous membranes moist. atraumatic  Cardiovascular: S1 S2 auscultated, no rubs, murmurs or gallops. Regular rate and rhythm.  Respiratory: Clear to auscultation bilaterally with equal chest rise  Abdomen: Soft, nontender, nondistended, + bowel sounds  Extremities: warm dry without cyanosis clubbing or edema RLE. LLE with dressing with brace.   Neuro: AAOx3, nonfocal  Psych: Appropriate mood and insight, pleasant  Data Reviewed: I have personally reviewed following labs and imaging studies  CBC:  Recent Labs Lab 02/05/17 1844  02/07/17  2094 02/08/17 7096 02/09/17 0308 02/10/17 0624 02/11/17 0503  WBC 10.3  < > 15.0* 12.2* 12.0* 10.8* 8.3  NEUTROABS 7.7  --   --   --   --   --   --   HGB 7.1*  < > 9.5* 8.5* 7.7* 9.1* 8.9*  HCT 22.1*  < > 28.9* 26.0* 24.0* 28.8* 27.1*  MCV 84.7  < > 84.5 87.2 88.2 86.7 86.9  PLT 521*  < > 391 375 357 331 334  < > = values in this interval not displayed. Basic Metabolic Panel:  Recent Labs Lab 02/05/17 1844  02/08/17 0337 02/09/17 0308 02/10/17 0624 02/11/17 0503 02/12/17 0340  NA 134*  < > 139 135 136 140 137  K 3.4*  < > 4.4 4.3 4.1 3.8 3.9  CL 101  < > 105 105 107 108 107  CO2 21*  < > 22 20* 20* 21* 22  GLUCOSE 180*  < > 101* 116* 75 83 96  BUN 79*  < > 73* 74* 74* 66* 59*  CREATININE 3.87*  < >  3.74* 3.88* 3.82* 3.50* 3.28*  CALCIUM 8.7*  < > 8.2* 8.2* 8.4* 8.7* 8.7*  MG 2.1  --   --   --   --   --   --   PHOS  --   --   --   --   --   --  5.0*  < > = values in this interval not displayed. GFR: Estimated Creatinine Clearance: 17.3 mL/min (A) (by C-G formula based on SCr of 3.28 mg/dL (H)). Liver Function Tests:  Recent Labs Lab 02/05/17 1844 02/12/17 0340  AST 16  --   ALT 20  --   ALKPHOS 117  --   BILITOT 0.3  --   PROT 7.8  --   ALBUMIN 2.6* 2.0*   No results for input(s): LIPASE, AMYLASE in the last 168 hours. No results for input(s): AMMONIA in the last 168 hours. Coagulation Profile: No results for input(s): INR, PROTIME in the last 168 hours. Cardiac Enzymes:  Recent Labs Lab 02/08/17 1536  CKTOTAL 77   BNP (last 3 results) No results for input(s): PROBNP in the last 8760 hours. HbA1C: No results for input(s): HGBA1C in the last 72 hours. CBG:  Recent Labs Lab 02/11/17 1309 02/11/17 1717 02/11/17 2239 02/12/17 0803 02/12/17 1310  GLUCAP 133* 84 105* 114* 190*   Lipid Profile: No results for input(s): CHOL, HDL, LDLCALC, TRIG, CHOLHDL, LDLDIRECT in the last 72 hours. Thyroid Function Tests: No results for input(s): TSH, T4TOTAL, FREET4, T3FREE, THYROIDAB in the last 72 hours. Anemia Panel: No results for input(s): VITAMINB12, FOLATE, FERRITIN, TIBC, IRON, RETICCTPCT in the last 72 hours. Urine analysis:    Component Value Date/Time   COLORURINE AMBER (A) 02/08/2017 1138   APPEARANCEUR TURBID (A) 02/08/2017 1138   LABSPEC 1.014 02/08/2017 1138   PHURINE 5.0 02/08/2017 1138   GLUCOSEU NEGATIVE 02/08/2017 1138   HGBUR MODERATE (A) 02/08/2017 1138   BILIRUBINUR NEGATIVE 02/08/2017 1138   KETONESUR NEGATIVE 02/08/2017 1138   PROTEINUR 100 (A) 02/08/2017 1138   UROBILINOGEN 0.2 09/02/2014 1504   NITRITE NEGATIVE 02/08/2017 1138   LEUKOCYTESUR LARGE (A) 02/08/2017 1138   Sepsis Labs: @LABRCNTIP (procalcitonin:4,lacticidven:4)  ) Recent  Results (from the past 240 hour(s))  Body Fluid Culture     Status: None   Collection Time: 02/03/17  4:34 PM  Result Value Ref Range Status   Gram Stain Moderate  Final  Gram Stain WBC present-predominately PMN  Final   Gram Stain No Organisms Seen  Final   Organism ID, Bacteria NO GROWTH  Final  Gram stain     Status: None   Collection Time: 02/03/17  4:34 PM  Result Value Ref Range Status   Gram Stain Moderate  Final   Gram Stain WBC present-predominately PMN  Final   Gram Stain No Organisms Seen  Final  Surgical pcr screen     Status: None   Collection Time: 02/05/17  9:20 PM  Result Value Ref Range Status   MRSA, PCR NEGATIVE NEGATIVE Final   Staphylococcus aureus NEGATIVE NEGATIVE Final    Comment:        The Xpert SA Assay (FDA approved for NASAL specimens in patients over 59 years of age), is one component of a comprehensive surveillance program.  Test performance has been validated by [facility] for patients greater than or equal to 39 year old. It is not intended to diagnose infection nor to guide or monitor treatment.   Aerobic/Anaerobic Culture (surgical/deep wound)     Status: None   Collection Time: 02/06/17  4:22 PM  Result Value Ref Range Status   Specimen Description TISSUE LEFT KNEE  Final   Special Requests NONE  Final   Gram Stain   Final    MODERATE WBC PRESENT,BOTH PMN AND MONONUCLEAR NO ORGANISMS SEEN    Culture No growth aerobically or anaerobically.  Final   Report Status 02/11/2017 FINAL  Final  Aerobic/Anaerobic Culture (surgical/deep wound)     Status: None (Preliminary result)   Collection Time: 02/06/17  5:01 PM  Result Value Ref Range Status   Specimen Description TISSUE  Final   Special Requests LEFT FEMORAL CANNEL  Final   Gram Stain   Final    FEW WBC PRESENT, PREDOMINANTLY MONONUCLEAR NO ORGANISMS SEEN    Culture HOLDING FOR POSSIBLE ANAEROBE  Final   Report Status PENDING  Incomplete  Culture, blood (routine x 2)     Status:  None   Collection Time: 02/07/17  8:30 AM  Result Value Ref Range Status   Specimen Description BLOOD RIGHT HAND  Final   Special Requests IN PEDIATRIC BOTTLE Blood Culture adequate volume  Final   Culture NO GROWTH 5 DAYS  Final   Report Status 02/12/2017 FINAL  Final  Culture, blood (routine x 2)     Status: None   Collection Time: 02/07/17  8:38 AM  Result Value Ref Range Status   Specimen Description BLOOD RIGHT ARM  Final   Special Requests IN PEDIATRIC BOTTLE Blood Culture adequate volume  Final   Culture NO GROWTH 5 DAYS  Final   Report Status 02/12/2017 FINAL  Final  Culture, Urine     Status: Abnormal   Collection Time: 02/08/17 11:38 AM  Result Value Ref Range Status   Specimen Description URINE, RANDOM  Final   Special Requests CX ADDED AT 0327 ON [id number]  Final   Culture >=100,000 COLONIES/mL YEAST (A)  Final   Report Status 02/10/2017 FINAL  Final      Radiology Studies: No results found.   Scheduled Meds: . amLODipine  10 mg Oral QHS  . calcitRIOL  0.25 mcg Oral Q M,W,F  . ciprofloxacin  250 mg Oral Daily  . enoxaparin (LOVENOX) injection  30 mg Subcutaneous Daily  . ferrous sulfate  325 mg Oral BID WC  . furosemide  80 mg Oral BID  . gabapentin  100 mg Oral TID  .  hydrALAZINE  100 mg Oral BID  . insulin aspart  0-9 Units Subcutaneous TID WC  . insulin glargine  15 Units Subcutaneous QAC breakfast  . mouth rinse  15 mL Mouth Rinse BID  . methocarbamol  500 mg Oral BID  . metoprolol succinate  50 mg Oral Daily  . multivitamin with minerals  1 tablet Oral Daily  . nicotine  14 mg Transdermal Daily  . pantoprazole  40 mg Oral Daily  . senna-docusate  2 tablet Oral QHS  . sertraline  100 mg Oral Daily   Continuous Infusions: . DAPTOmycin (CUBICIN)  IV Stopped (02/12/17 1302)     LOS: 7 days   Time Spent in minutes   35 minutes  Hiren Peplinski D.O. on 02/12/2017 at 5:04 PM  Between 7am to 7pm - Pager - [phone]  After 7pm go to [email or web address] -  password [email or web address]  And look for the night coverage person covering for me after hours  [facility] Office  [phone]

## 2017-02-12 NOTE — Clinical Social Work Note (Signed)
Clinical Social Work Assessment  Patient Details  Name: Brandy Houston MRN: [id number] Date of Birth: 1955/10/15  Date of referral:  02/12/17               Reason for consult:  Facility Placement                Permission sought to share information with:  Facility Sport and exercise psychologist, Family Supports Permission granted to share information::  Yes, Verbal Permission Granted  Name::     Radio producer::  SNF  Relationship::  son  Contact Information:     Housing/Transportation Living arrangements for the past 2 months:  [place] of Information:  Patient Patient Interpreter Needed:  None Criminal Activity/Legal Involvement Pertinent to Current Situation/Hospitalization:  No - Comment as needed Significant Relationships:  Adult Children Lives with:  Adult Children (son) Do you feel safe going back to the place where you live?  No Need for family participation in patient care:  No (Coment)  Care giving concerns:  Pt lives at home with son who works during the day.  Pt normally able to do most things on her own and now with increased weakness and no one to be with her during the day.   Social Worker assessment / plan:  CSW spoke with pt concerning PT recommendations for SNF.  Pt has been at SNF in the past after surgery and understands SNF and referral process.  Employment status:  Retired Forensic scientist:  [employer] PT Recommendations:  [place] / Referral to community resources:  [place]  Patient/Family's Response to care: Pt is agreeable to SNF stay- had a good experience in the past  Patient/Family's Understanding of and Emotional Response to Diagnosis, Current Treatment, and Prognosis:  Pt with optimistic outlook on prognosis/treatment but acknowledges she has a long way ahead of her with another surgery after rehab.  Emotional Assessment Appearance:  Appears stated age Attitude/Demeanor/Rapport:     Affect (typically observed):  Appropriate, Adaptable Orientation:  Oriented to Self, Oriented to Place, Oriented to  Time, Oriented to Situation Alcohol / Substance use:  Not Applicable Psych involvement (Current and /or in the community):  No (Comment)  Discharge Needs  Concerns to be addressed:  Care Coordination Readmission within the last 30 days:  No Current discharge risk:  Physical Impairment Barriers to Discharge:  Continued Medical Work up   [employer], LCSW 02/12/2017, 2:36 PM

## 2017-02-12 NOTE — NC FL2 (Addendum)
[place] LEVEL OF CARE SCREENING TOOL     IDENTIFICATION  Patient Name: Brandy Houston Birthdate: 1955/10/22 Sex: female Admission Date (Current Location): 02/05/2017  [facility] and [place] Number:  Herbalist and Address:  [facility] [place], [place] [address], [place], [place] [address]      Provider Number: [id number]  Attending Physician Name and Address:  Velvet Bathe, MD  Relative Name and Phone Number:       Current Level of Care: Hospital Recommended Level of Care: [place] Prior Approval Number:    Date Approved/Denied:   PASRR Number: [id number]  Discharge Plan: SNF    Current Diagnoses: Patient Active Problem List   Diagnosis Date Noted  . Fever 02/08/2017  . Infection of prosthetic left knee joint ([place]) 02/07/2017  . AKI (acute kidney injury) ([place])   . Hemarthrosis   . Anemia of chronic disease 02/05/2017  . Diabetes mellitus, type II ([place]) 02/05/2017  . Dysplasia of cervix, high grade CIN 2 01/12/2017  . ASCUS with positive high risk HPV cervical 12/09/2016  . Chronic diastolic CHF (congestive heart failure) ([place]) 11/08/2015  . Uncontrolled type 2 diabetes mellitus with complication ([place])   . Total knee replacement status   . Diabetic peripheral neuropathy ([place])   . Anxiety state   . Depression   . Normocytic anemia 11/07/2015  . Chronic kidney disease (CKD), stage IV (severe) ([place])   . Hepatitis C 08/16/2014  . Peripheral neuropathy 06/24/2013  . 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 Essential hypertension 02/18/2013    Orientation RESPIRATION BLADDER Height & Weight     Self, Time, Situation, Place  Normal Continent Weight: 156 lb (70.8 kg) Height:  5\' 7"  (170.2 cm)  BEHAVIORAL SYMPTOMS/MOOD NEUROLOGICAL BOWEL NUTRITION STATUS      Continent  (Please see d/c summary)  AMBULATORY STATUS COMMUNICATION OF NEEDS Skin   Extensive Assist Verbally Surgical wounds (Closed incision left  knee, compression wrapped. Closed incision left shoulder)                       Personal Care Assistance Level of Assistance  Bathing, Feeding, Dressing Bathing Assistance: Limited assistance Feeding assistance: Independent Dressing Assistance: Limited assistance     Functional Limitations Info  Hearing, Speech, Sight Sight Info: Adequate Hearing Info: Adequate Speech Info: Adequate    SPECIAL CARE FACTORS FREQUENCY  PT (By licensed PT), OT (By licensed OT)     PT Frequency: 3x week OT Frequency: 3x week            Contractures Contractures Info: Not present    Additional Factors Info  Code Status, Allergies Code Status Info: Full Code Allergies Info:  Compazine (Prochlorperazin)e, Shellfish-derived Products, Iodinated Diagnostic Agents, Omnipaque (Iohexol)           Current Medications (02/12/2017):  This is the current hospital active medication list Current Facility-Administered Medications  Medication Dose Route Frequency Provider Last Rate Last Dose  . acetaminophen (TYLENOL) tablet 650 mg  650 mg Oral Q6H PRN Cristal Ford, DO   650 mg at 02/07/17 2563   Or  . acetaminophen (TYLENOL) suppository 650 mg  650 mg Rectal Q6H PRN Cristal Ford, DO      . acetaminophen (TYLENOL) tablet 1,000 mg  1,000 mg Oral Q6H PRN Cristal Ford, DO   1,000 mg at 02/08/17 2131  . amLODipine (NORVASC) tablet 10 mg  10 mg Oral QHS Elmarie Shiley, MD      .  calcitRIOL (ROCALTROL) capsule 0.25 mcg  0.25 mcg Oral Q M,W,F Cristal Ford, DO   0.25 mcg at 02/11/17 0955  . calcium carbonate (TUMS - dosed in mg elemental calcium) chewable tablet 200 mg of elemental calcium  1 tablet Oral TID PRN Cristal Ford, DO      . ciprofloxacin (CIPRO) tablet 250 mg  250 mg Oral Daily Michel Bickers, MD   250 mg at 02/11/17 2104  . DAPTOmycin (CUBICIN) 420 mg in sodium chloride 0.9 % IVPB  420 mg Intravenous Q48H Jaquita Folds, RPH   Stopped at 02/10/17 1142  . diphenhydrAMINE  (BENADRYL) capsule 25 mg  25 mg Oral Q6H PRN Cristal Ford, DO   25 mg at 02/10/17 0542  . enoxaparin (LOVENOX) injection 30 mg  30 mg Subcutaneous Daily Cristal Ford, DO   30 mg at 02/12/17 1000  . ferrous sulfate tablet 325 mg  325 mg Oral BID WC Cristal Ford, DO   325 mg at 02/12/17 0751  . furosemide (LASIX) tablet 80 mg  80 mg Oral BID Elmarie Shiley, MD   80 mg at 02/12/17 0751  . gabapentin (NEURONTIN) capsule 100 mg  100 mg Oral TID Cristal Ford, DO   100 mg at 02/12/17 1000  . hydrALAZINE (APRESOLINE) tablet 100 mg  100 mg Oral BID Elmarie Shiley, MD      . insulin aspart (novoLOG) injection 0-9 Units  0-9 Units Subcutaneous TID [facility] Cristal Ford, DO   1 Units at 02/11/17 1402  . insulin glargine (LANTUS) injection 15 Units  15 Units Subcutaneous QAC breakfast Cristal Ford, DO   15 Units at 02/12/17 0109  . lidocaine (XYLOCAINE) 1 % (with pres) injection   Infiltration PRN Greggory Keen, MD   10 mL at 02/10/17 1346  . MEDLINE mouth rinse  15 mL Mouth Rinse BID Cristal Ford, DO   15 mL at 02/10/17 2200  . methocarbamol (ROBAXIN) tablet 500 mg  500 mg Oral BID Cristal Ford, DO   500 mg at 02/12/17 1000  . metoprolol succinate (TOPROL-XL) 24 hr tablet 50 mg  50 mg Oral Daily Elmarie Shiley, MD      . morphine 4 MG/ML injection 2 mg  2 mg Intravenous Q2H PRN Ritta Slot, NP   2 mg at 02/10/17 1133  . multivitamin with minerals tablet 1 tablet  1 tablet Oral Daily Cristal Ford, DO   1 tablet at 02/12/17 1000  . nicotine (NICODERM CQ - dosed in mg/24 hours) patch 14 mg  14 mg Transdermal Daily Cristal Ford, DO   14 mg at 02/12/17 1022  . ondansetron (ZOFRAN) tablet 4 mg  4 mg Oral Q6H PRN Cristal Ford, DO       Or  . ondansetron [facility]) injection 4 mg  4 mg Intravenous Q6H PRN Cristal Ford, DO   4 mg at 02/12/17 0751  . oxyCODONE (Oxy IR/ROXICODONE) immediate release tablet 5-10 mg  5-10 mg Oral Q4H PRN Leandrew Koyanagi, MD   10 mg at 02/12/17 0751  .  pantoprazole (PROTONIX) EC tablet 40 mg  40 mg Oral Daily Cristal Ford, DO   40 mg at 02/12/17 1000  . promethazine (PHENERGAN) injection 12.5 mg  12.5 mg Intravenous Q6H PRN Walden Field A, NP   12.5 mg at 02/07/17 1541  . senna-docusate (Senokot-S) tablet 2 tablet  2 tablet Oral QHS Cristal Ford, DO   2 tablet at 02/10/17 2203  . sertraline (ZOLOFT) tablet 100 mg  100 mg Oral Daily  Mikhail, Velta Addison, DO   100 mg at 02/12/17 1000  . traMADol (ULTRAM) tablet 50-100 mg  50-100 mg Oral Q6H PRN Cristal Ford, DO         Discharge Medications: Please see discharge summary for a list of discharge medications.  Relevant Imaging Results:  Relevant Lab Results:   Additional Information SSN: [id number]  IV Cubicin needed through [place], LCSW

## 2017-02-12 NOTE — Care Management Note (Signed)
Case Management Note  Patient Details  Name: Brandy Houston MRN: [id number] Date of Birth: 17-Sep-1955  Subjective/Objective:                    Action/Plan:  Patient now interested in SNF for short term rehab. SW aware Expected Discharge Date:                  Expected Discharge Plan:  Skilled Nursing Facility  In-House Referral:  Clinical Social Work  Discharge planning Services  CM Consult  Post Acute Care Choice:    Choice offered to:  Patient  DME Arranged:    DME Agency:     HH Arranged:  RN, PT, OT [place] Agency:  [place]  Status of Service:  In process, will continue to follow  If discussed at Long Length of Stay Meetings, dates discussed:    Additional Comments:  Marilu Favre, RN 02/12/2017, 10:52 AM

## 2017-02-13 ENCOUNTER — Other Ambulatory Visit: Payer: Self-pay | Admitting: Pharmacist

## 2017-02-13 LAB — GLUCOSE, CAPILLARY
Glucose-Capillary: 100 mg/dL — ABNORMAL HIGH (ref 65–99)
Glucose-Capillary: 109 mg/dL — ABNORMAL HIGH (ref 65–99)
Glucose-Capillary: 143 mg/dL — ABNORMAL HIGH (ref 65–99)
Glucose-Capillary: 109 mg/dL — ABNORMAL HIGH (ref 65–99)

## 2017-02-13 NOTE — Progress Notes (Signed)
Pharmacy Antibiotic Note  Brandy Houston is a 61 y.o. female admitted on 02/05/2017 with joint infection.  Pharmacy has been consulted for daptomycin dosing.  SCr trending down (3.28), est CrCl 17 ml/min. CK 77 on 8/26. Repeat CK pending for 9/3. Lytes ok.   Plan: Continue Daptomycin 420mg  (6mg /kg) IV every 48 hours.  Continue Ciprofloxacin 250 mg po daily.  Follow renal function/HD plans, clinical progression, c/s, ID recommendations   Height: 5\' 7"  (170.2 cm) Weight: 156 lb (70.8 kg) IBW/kg (Calculated) : 61.6  Temp (24hrs), Avg:98.6 F (37 C), Min:98.3 F (36.8 C), Max:99.1 F (37.3 C)   Recent Labs Lab 02/07/17 0238 02/08/17 0337 02/09/17 0308 02/10/17 0624 02/11/17 0503 02/12/17 0340  WBC 15.0* 12.2* 12.0* 10.8* 8.3  --   CREATININE 3.67* 3.74* 3.88* 3.82* 3.50* 3.28*    Estimated Creatinine Clearance: 17.3 mL/min (A) (by C-G formula based on SCr of 3.28 mg/dL (H)).    Allergies  Allergen Reactions  . Compazine [Prochlorperazine] Shortness Of Breath and Swelling    TONGUE SWELLS  . Shellfish-Derived Products Anaphylaxis  . Iodinated Diagnostic Agents Hives and Rash  . Omnipaque [Iohexol] Hives    Antimicrobials this admission: Vancomycin 8/24 >> 8/25 Dapto 8/26 >>   Dose adjustments this admission: n/a  Microbiology results: 8/23 MRSA PCR: neg  8/24 L femoral cannel: 8/24 L knee: 8/25 BCx:  Thank you for allowing pharmacy to be a part of this patient's care.  Sloan Leiter, PharmD, BCPS Clinical Pharmacist Clinical phone 02/13/2017 until 3:30PM [phone] After hours, please call [phone] 02/13/2017 2:30 PM

## 2017-02-13 NOTE — Progress Notes (Signed)
Critical lab values from  Left femoral canal -Rare gram negative Rod Anaerobic was endorsed to Marcie Bal ,Therapist, sports. Dr. Wendee Beavers was also notified.

## 2017-02-13 NOTE — Progress Notes (Signed)
CRITICAL VALUE ALERT  Critical Value:  Pathology from left knee showed positive rare gram neg rods  Date & Time Notied:  02/13/17 1500  Provider Notified: O.Vega  Orders Received/Actions taken: MD Wendee Beavers asked if I could call MD Megan Salon with ID to ensure she was on the right antibiotics to cover that specific bacteria, MD Megan Salon said he would look into it and adjust orders if needed.

## 2017-02-13 NOTE — Progress Notes (Signed)
Physical Therapy Treatment Patient Details Name: Brandy Houston MRN: [id number] DOB: 08-Apr-1956 Today's Date: 02/13/2017    History of Present Illness 61 y.o. 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 Presented to the hospital from Dr. [name] office (ortho) status post aspiration with elevated WBC. ADmitted with septic knee. 8/24 Pt s/p Lt TKA excision arthroplasty with spacer placed    PT Comments    Pt performed increased gait with improved ability to maintain TDWB.  Pt however does remain to require cues and required min assist throughout gait and transfers.  Pt continues to benefit from [facility] to improve strength and functional mobility before returning home to private residence.      Follow Up Recommendations  SNF;Supervision/Assistance - 24 hour     Equipment Recommendations  Other (comment) (TBD next venue)    Recommendations for Other Services       Precautions / Restrictions Precautions Precautions: Fall Required Braces or Orthoses: Knee Immobilizer - Left Restrictions Weight Bearing Restrictions: Yes LLE Weight Bearing: Touchdown weight bearing    Mobility  Bed Mobility Overal bed mobility: Needs Assistance Bed Mobility: Sit to Supine     Supine to sit: Min assist Sit to supine: Min guard   General bed mobility comments: Guarding and support to left LE while moving into bed.    Transfers Overall transfer level: Needs assistance Equipment used: Rolling walker (2 wheeled) Transfers: Sit to/from Stand Sit to Stand: Min assist Stand pivot transfers: Min assist       General transfer comment: Assist for power up and for balance.  Ambulation/Gait Ambulation/Gait assistance: Min assist Ambulation Distance (Feet): 30 Feet Assistive device: Rolling walker (2 wheeled) Gait Pattern/deviations: Step-to pattern;Decreased stance time -  left;Decreased step length - right;Decreased weight shift to left;Trunk flexed Gait velocity: decreased   General Gait Details: cues for sequencing, maintaining TDWB, and proximity of RW; better ability to advance LLE with cues for dorsiflexion in swing phase   Stairs            Wheelchair Mobility    Modified Rankin (Stroke Patients Only)       Balance Overall balance assessment: Needs assistance   Sitting balance-Leahy Scale: Good     Standing balance support: Bilateral upper extremity supported Standing balance-Leahy Scale: Poor                              Cognition Arousal/Alertness: Awake/alert Behavior During Therapy: WFL for tasks assessed/performed Overall Cognitive Status: Within Functional Limits for tasks assessed                                        Exercises      General Comments        Pertinent Vitals/Pain Pain Assessment: 0-10 Pain Score: 6  Pain Location: left knee with mobility Pain Descriptors / Indicators: Sore;Grimacing;Guarding Pain Intervention(s): Monitored during session;Repositioned    Home Living                      Prior Function            PT Goals (current goals can now be found in the care plan section) Acute Rehab PT Goals Patient Stated Goal: return home Potential to Achieve Goals: Fair  Progress towards PT goals: Progressing toward goals    Frequency    Min 3X/week      PT Plan Current plan remains appropriate    Co-evaluation              AM-PAC PT "6 Clicks" Daily Activity  Outcome Measure  Difficulty turning over in bed (including adjusting bedclothes, sheets and blankets)?: A Little Difficulty moving from lying on back to sitting on the side of the bed? : A Little Difficulty sitting down on and standing up from a chair with arms (e.g., wheelchair, bedside commode, etc,.)?: Unable Help needed moving to and from a bed to chair (including a wheelchair)?: A  Little Help needed walking in hospital room?: A Little Help needed climbing 3-5 steps with a railing? : Total 6 Click Score: 14    End of Session Equipment Utilized During Treatment: Gait belt;Left knee immobilizer Activity Tolerance: Patient tolerated treatment well Patient left: in bed;with call bell/phone within reach;with bed alarm set Nurse Communication: Mobility status PT Visit Diagnosis: Difficulty in walking, not elsewhere classified (R26.2);Muscle weakness (generalized) (M62.81);Other abnormalities of gait and mobility (R26.89);Pain Pain - Right/Left: Left Pain - part of body: Knee     Time: 9449-6759 PT Time Calculation (min) (ACUTE ONLY): 25 min  Charges:  $Gait Training: 8-22 mins $Therapeutic Activity: 8-22 mins                    G Codes:       Governor Rooks, PTA pager [phone]    Cristela Blue 02/13/2017, 3:49 PM

## 2017-02-13 NOTE — Care Management Important Message (Signed)
Important Message  Patient Details  Name: Brandy Houston MRN: [id number] Date of Birth: [birth year]   Medicare Important Message Given:  Yes    Nathen May 02/13/2017, 9:41 AM

## 2017-02-13 NOTE — Progress Notes (Signed)
[name], female   DOB: 10/31/1955, 61 y.o.   MRN: [id number]          [facility]    Date of Admission:  02/05/2017   Total days of antibiotics 8        Day 6 daptomycin        Day 4 ciprofloxacin  [name] nurse notified me that the lab had called and said that and anaerobic gram-negative rod had shown up late in the tissue culture from her knee. This may be an insignificant contaminant. I reviewed the findings with our microbiology lab tech. I will continue daptomycin and ciprofloxacin for now. I will review final cultures early next week and make any changes in her therapy that might be needed.         Michel Bickers, MD [facility] [phone] pager   [phone] cell 02/13/2017, 3:31 PM

## 2017-02-13 NOTE — Progress Notes (Signed)
Occupational Therapy Treatment Patient Details Name: Brandy Houston MRN: [id number] DOB: [birth year] Today's Date: 02/13/2017    History of present illness 61 y.o. 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 Presented to the hospital from Dr. [name] office (ortho) status post aspiration with elevated WBC. ADmitted with septic knee. 8/24 Pt s/p Lt TKA excision arthroplasty with spacer placed   OT comments  Patient required min assist for functional mobility from bed to chair. Completed bathing ADLs in sitting (sit to stand for LB bathing).  Due to patient's limited mobility and decreased independence with ADLs, updating dc plan to SNF in order to receive further occupational therapy services in order to maximize safety and independence with ADLs. Will continue to follow acutely.  Follow Up Recommendations  SNF;Supervision/Assistance - 24 hour    Equipment Recommendations  None recommended by OT    Recommendations for Other Services      Precautions / Restrictions Precautions Precautions: Fall Required Braces or Orthoses: Knee Immobilizer - Left Restrictions Weight Bearing Restrictions: Yes LLE Weight Bearing: Touchdown weight bearing       Mobility Bed Mobility Overal bed mobility: Needs Assistance Bed Mobility: Supine to Sit     Supine to sit: Min assist     General bed mobility comments: Guarding and support to left LE while moving OOB.  Transfers Overall transfer level: Needs assistance Equipment used: Rolling walker (2 wheeled) Transfers: Sit to/from Stand Sit to Stand: Min assist Stand pivot transfers: Min assist       General transfer comment: Assist for power up and for balance.    Balance Overall balance assessment: Needs assistance         Standing balance support: Bilateral upper extremity supported Standing balance-Leahy Scale: Poor                             ADL either performed or assessed with clinical judgement   ADL Overall ADL's : Needs assistance/impaired Eating/Feeding: Set up;Sitting   Grooming: Brushing hair;Applying deodorant;Wash/dry hands;Wash/dry face;Sitting;Set up;Supervision/safety   Upper Body Bathing: Supervision/ safety;Set up;Sitting   Lower Body Bathing: Maximal assistance;Sit to/from stand   Upper Body Dressing : Supervision/safety;Set up;Sitting       Toilet Transfer: Minimal assistance;Stand-pivot;BSC Toilet Transfer Details (indicate cue type and reason): simulated through chair to EOB transfer          Functional mobility during ADLs: Minimal assistance;Rolling walker General ADL Comments: Pt transferred from bed to recliner. Completed bathing ADLs sitting in recliner and donned new gown.       Vision       Perception     Praxis      Cognition Arousal/Alertness: Awake/alert Behavior During Therapy: WFL for tasks assessed/performed Overall Cognitive Status: Within Functional Limits for tasks assessed                                          Exercises     Shoulder Instructions       General Comments      Pertinent Vitals/ Pain       Pain Assessment: 0-10 Pain Score: 5  Pain Location: left knee with mobility Pain Descriptors / Indicators: Sore;Grimacing;Guarding Pain Intervention(s): Monitored during session;Repositioned  Home Living  Prior Functioning/Environment              Frequency  Min 2X/week        Progress Toward Goals  OT Goals(current goals can now be found in the care plan section)  Progress towards OT goals: Progressing toward goals  Acute Rehab OT Goals Patient Stated Goal: return home OT Goal Formulation: With patient Time For Goal Achievement: 02/23/17 Potential to Achieve Goals: Good ADL Goals Pt Will Perform Grooming: with modified independence;sitting Pt Will  Perform Upper Body Bathing: sitting;with modified independence Pt Will Perform Lower Body Bathing: with min assist;sitting/lateral leans;with adaptive equipment Pt Will Perform Lower Body Dressing: with min assist;sitting/lateral leans;with adaptive equipment Pt Will Transfer to Toilet: with min guard assist;stand pivot transfer;bedside commode Pt Will Perform Toileting - Clothing Manipulation and hygiene: with min guard assist;sit to/from stand  Plan Discharge plan needs to be updated    Co-evaluation                 AM-PAC PT "6 Clicks" Daily Activity     Outcome Measure   Help from another person eating meals?: None Help from another person taking care of personal grooming?: A Little Help from another person toileting, which includes using toliet, bedpan, or urinal?: A Lot Help from another person bathing (including washing, rinsing, drying)?: A Lot Help from another person to put on and taking off regular upper body clothing?: A Little Help from another person to put on and taking off regular lower body clothing?: A Lot 6 Click Score: 16    End of Session Equipment Utilized During Treatment: Gait belt;Rolling walker  OT Visit Diagnosis: Unsteadiness on feet (R26.81);Muscle weakness (generalized) (M62.81)   Activity Tolerance Patient tolerated treatment well   Patient Left in chair;with call bell/phone within reach;with chair alarm set   Nurse Communication Mobility status        Time: 2297-9892 OT Time Calculation (min): 29 min  Charges: OT General Charges $OT Visit: 1 Visit OT Treatments $Self Care/Home Management : 23-37 mins     Darrol Jump OTR/L 02/13/2017, 12:30 PM

## 2017-02-13 NOTE — Progress Notes (Signed)
PHARMACY CONSULT NOTE FOR:  OUTPATIENT  PARENTERAL ANTIBIOTIC THERAPY (OPAT)  Indication: Left prosthetic knee infection Regimen: Daptomycin 420mg  IV every 48 hours + Cipro 250mg  po daily.  End date: 03/20/17  IV antibiotic discharge orders are pended. To discharging provider:  please sign these orders via discharge navigator,  Select New Orders & click on the button choice - Manage This Unsigned Work.     Thank you for allowing pharmacy to be a part of this patient's care.  Brain Hilts 02/13/2017, 2:39 PM

## 2017-02-14 LAB — GLUCOSE, CAPILLARY
Glucose-Capillary: 135 mg/dL — ABNORMAL HIGH (ref 65–99)
Glucose-Capillary: 197 mg/dL — ABNORMAL HIGH (ref 65–99)
Glucose-Capillary: 162 mg/dL — ABNORMAL HIGH (ref 65–99)
Glucose-Capillary: 99 mg/dL (ref 65–99)

## 2017-02-14 LAB — AEROBIC/ANAEROBIC CULTURE W GRAM STAIN (SURGICAL/DEEP WOUND)

## 2017-02-14 NOTE — Progress Notes (Signed)
PROGRESS NOTE    Brandy ALVERSON  RWE:[id number] DOB: 01/10/1956 DOA: 02/05/2017 PCP: [name], MD   No chief complaint on file.  Brief Narrative:  HPI On 02/05/2017 Brandy Houston is a 61 y.o. female with a medical history of diastolic heart failure, chronic kidney disease, diabetes, hypertension, septic arthritis, presented to the hospital from Dr. [name] office (ortho). Patient presented to his office on 02/03/2017 for left knee pain, status post aspiration. Fluid showed greater than 58,000 WBC. Patient states over the past several months, she's been having problems walking and her knee has been popping out of place. Currently patient denies any chest pain, shortness breath, abdominal pain, nausea vomiting, diarrhea or constipation, dizziness or headache. She denies any recent trauma or falls. Denies any recent travel or ill contacts. Patient has been seeing a hematologist for hemarthrosis and has had several bloody knee effusions. Per documentation, unlikely to be coagulopathy. Patient has not been on any antibiotics recently.   Interim history Underwent left total knee excision with placement of antibiotic spacer. Currently on daptomycin and ciprofloxacin. 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 [name] -s/p aspiration yielding 120 mL's of bloody fluid (on 02/03/2017) -Fluid shows 58,000 WBC. Gram stain showed no organisms -s/p left total knee excision arthroplasty and placement of articulating methylmethacrylate antibiotic spacer; debridement of bone, muscle and skin 15x12cm; wound repair left knee 15cm -ID on board and assisting with case.  -Continue daptomycin and ciprofloxacin, will need IV daptomycin for 6 weeks per infectious disease recommendations -Central line in place  Fever  - Resolved.  Leukocytosis  -WBC improving today to 10.8 -likely  reactive to recent surgery vs possible knee infection -Currently on daptomycin and cipro -Blood cultures show no growth to date -CXR unremarkable for infection -UA TNTC WBC, large leukocyte, few bacteria (patient not complaining of any urinary symptoms) -Urine culture>100k yeast, patient has no complaints of urinary symptoms  -wound culture shows no growth to date -Continue to monitor CBC  Chronic diastolic heart failure -Echocardiogram February 2016 showed a grade 2 diastolic dysfunction -Currently patient appears to be euvolemic and compensated -Takes metolazone and Lasix at home- currently held due to AKI -Patient was given IV lasix between blood transfusions to avoid volume overload on admission -will give additional dose of lasix today as patient will receive 2uPRBC -Monitor intake and output, daily weights -urine output over past 24hrs 1050cc  Acute kidney injury vs Chronic kidney disease, stage IV - Serum creatinine continues to improve. Will continue to monitor.  Diabetes mellitus, type II with neuropathy -Continue Lantus, insulin sliding scale was CBG monitoring -Hemoglobin A1c 6.2 -Continue gabapentin for neuropathy  Essential hypertension -Continue hydralazine -metoprolol and amlodipine discontinued  Depression -Continue Zoloft  Tobacco abuse -Smoking cessation discussed -Continue Nicotine patch  Anemia of chronic disease/ hemarthrosis/ Anemia due to blood loss -Hemoglobin was 7.1 on admission (baseline hemoglobin 9 over the past year) -Has had a history of hemarthrosis and was seeing oncology. Patient was receiving Aranesp injections every 30 days. -Patient denies any complaints of melena or hematochezia -Recently had anemia panel on 02/02/2017 which showed iron of 18, ferritin of 52 -Given 7uPRBC since admission -Hemoglobin currently 9.1 -Continue iron supplementation  -Continue to monitor CBC  Hepatitis C -Per patient, stable -Continue outpatient  follow up  DVT Prophylaxis  SCDs/lovenox  Code Status: Full  Family Communication:  None at bedside.   Disposition Plan: With improvement in condition  Consultants Orthopedic surgery, Dr. [name] Nephrology Infectious disease Interventional radiology  Procedures  Left total knee excision arthroplasty and placement of articulating methylmethacrylate antibiotic spacer; debridement of bone, muscle and skin 15x12cm; wound repair left knee 15cm  Antibiotics   Anti-infectives    Start     Dose/Rate Route Frequency Ordered Stop   02/10/17 1052  DAPTOmycin (CUBICIN) 420 mg in sodium chloride 0.9 % IVPB     420 mg 216.8 mL/hr over 30 Minutes Intravenous Every 48 hours 02/08/17 1255     02/09/17 1700  ciprofloxacin (CIPRO) tablet 250 mg     250 mg Oral Daily 02/09/17 1552     02/08/17 1600  vancomycin (VANCOCIN) IVPB 1000 mg/200 mL premix  Status:  Discontinued     1,000 mg 200 mL/hr over 60 Minutes Intravenous Every 48 hours 02/06/17 2214 02/07/17 1717   02/08/17 1000  DAPTOmycin (CUBICIN) 420 mg in sodium chloride 0.9 % IVPB  Status:  Discontinued     420 mg 216.8 mL/hr over 30 Minutes Intravenous Every 24 hours 02/07/17 1805 02/08/17 1255   02/06/17 1806  tobramycin (NEBCIN) powder  Status:  Discontinued       As needed 02/06/17 1841 02/06/17 1900   02/06/17 1610  vancomycin (VANCOCIN) powder  Status:  Discontinued       As needed 02/06/17 1611 02/06/17 1900   02/06/17 1458  vancomycin (VANCOCIN) 1-5 GM/200ML-% IVPB    Comments:  Schonewitz, Leigh   : cabinet override      02/06/17 1458 02/07/17 0259      Subjective:   Brandy Houston  Pt has no new complaints reported to me today.  Objective:   Vitals:   02/13/17 2103 02/14/17 0500 02/14/17 0643 02/14/17 1418  BP: (!) 147/71  135/60 (!) 154/86  Pulse: 68  67 83  Resp: 17  18 17   Temp: 99 F (37.2 C)  99 F (37.2 C) 98.4 F (36.9 C)  TempSrc: Oral  Oral Oral  SpO2: 95%  96% 96%  Weight:  71.5 kg (157 lb 9.6 oz)    Height:         Intake/Output Summary (Last 24 hours) at 02/14/17 1523 Last data filed at 02/14/17 1419  Gross per 24 hour  Intake             1300 ml  Output             1825 ml  Net             -525 ml   Filed Weights   02/12/17 0500 02/13/17 0700 02/14/17 0500  Weight: 70.8 kg (156 lb) 70.8 kg (156 lb) 71.5 kg (157 lb 9.6 oz)   ExamExam unchanged when compared to 02/13/2017  General: Well developed, well nourished, NAD  HEENT: NCAT, mucous membranes moist. atraumatic  Cardiovascular: S1 S2 auscultated, no rubs, murmurs or gallops. Regular rate and rhythm.  Respiratory: Clear to auscultation bilaterally with equal chest rise  Abdomen: Soft, nontender, nondistended, + bowel sounds  Extremities: warm dry without cyanosis clubbing or edema RLE. LLE with dressing with brace.   Neuro: AAOx3, nonfocal  Psych: Appropriate mood and insight, pleasant  Data Reviewed: I have personally reviewed following labs and imaging studies  CBC:  Recent Labs Lab 02/08/17 0337 02/09/17 0308 02/10/17 0624 02/11/17 0503  WBC 12.2* 12.0* 10.8* 8.3  HGB 8.5* 7.7* 9.1* 8.9*  HCT 26.0* 24.0* 28.8* 27.1*  MCV  87.2 88.2 86.7 86.9  PLT 375 357 331 735   Basic Metabolic Panel:  Recent Labs Lab 02/08/17 0337 02/09/17 0308 02/10/17 0624 02/11/17 0503 02/12/17 0340  NA 139 135 136 140 137  K 4.4 4.3 4.1 3.8 3.9  CL 105 105 107 108 107  CO2 22 20* 20* 21* 22  GLUCOSE 101* 116* 75 83 96  BUN 73* 74* 74* 66* 59*  CREATININE 3.74* 3.88* 3.82* 3.50* 3.28*  CALCIUM 8.2* 8.2* 8.4* 8.7* 8.7*  PHOS  --   --   --   --  5.0*   GFR: Estimated Creatinine Clearance: 17.3 mL/min (A) (by C-G formula based on SCr of 3.28 mg/dL (H)). Liver Function Tests:  Recent Labs Lab 02/12/17 0340  ALBUMIN 2.0*   No results for input(s): LIPASE, AMYLASE in the last 168 hours. No results for input(s): AMMONIA in the last 168 hours. Coagulation Profile: No results for input(s): INR, PROTIME in the last 168  hours. Cardiac Enzymes:  Recent Labs Lab 02/08/17 1536  CKTOTAL 77   BNP (last 3 results) No results for input(s): PROBNP in the last 8760 hours. HbA1C: No results for input(s): HGBA1C in the last 72 hours. CBG:  Recent Labs Lab 02/13/17 1154 02/13/17 1653 02/13/17 2108 02/14/17 0750 02/14/17 1206  GLUCAP 109* 143* 109* 135* 197*   Lipid Profile: No results for input(s): CHOL, HDL, LDLCALC, TRIG, CHOLHDL, LDLDIRECT in the last 72 hours. Thyroid Function Tests: No results for input(s): TSH, T4TOTAL, FREET4, T3FREE, THYROIDAB in the last 72 hours. Anemia Panel: No results for input(s): VITAMINB12, FOLATE, FERRITIN, TIBC, IRON, RETICCTPCT in the last 72 hours. Urine analysis:    Component Value Date/Time   COLORURINE AMBER (A) 02/08/2017 1138   APPEARANCEUR TURBID (A) 02/08/2017 1138   LABSPEC 1.014 02/08/2017 1138   PHURINE 5.0 02/08/2017 1138   GLUCOSEU NEGATIVE 02/08/2017 1138   HGBUR MODERATE (A) 02/08/2017 1138   BILIRUBINUR NEGATIVE 02/08/2017 1138   KETONESUR NEGATIVE 02/08/2017 1138   PROTEINUR 100 (A) 02/08/2017 1138   UROBILINOGEN 0.2 09/02/2014 1504   NITRITE NEGATIVE 02/08/2017 1138   LEUKOCYTESUR LARGE (A) 02/08/2017 1138   Sepsis Labs: @LABRCNTIP (procalcitonin:4,lacticidven:4)  ) Recent Results (from the past 240 hour(s))  Surgical pcr screen     Status: None   Collection Time: 02/05/17  9:20 PM  Result Value Ref Range Status   MRSA, PCR NEGATIVE NEGATIVE Final   Staphylococcus aureus NEGATIVE NEGATIVE Final    Comment:        The Xpert SA Assay (FDA approved for NASAL specimens in patients over 41 years of age), is one component of a comprehensive surveillance program.  Test performance has been validated by [facility] for patients greater than or equal to 51 year old. It is not intended to diagnose infection nor to guide or monitor treatment.   Aerobic/Anaerobic Culture (surgical/deep wound)     Status: None   Collection Time:  02/06/17  4:22 PM  Result Value Ref Range Status   Specimen Description TISSUE LEFT KNEE  Final   Special Requests NONE  Final   Gram Stain   Final    MODERATE WBC PRESENT,BOTH PMN AND MONONUCLEAR NO ORGANISMS SEEN    Culture No growth aerobically or anaerobically.  Final   Report Status 02/11/2017 FINAL  Final  Aerobic/Anaerobic Culture (surgical/deep wound)     Status: None   Collection Time: 02/06/17  5:01 PM  Result Value Ref Range Status   Specimen Description TISSUE  Final  Special Requests LEFT FEMORAL CANNEL  Final   Gram Stain   Final    FEW WBC PRESENT, PREDOMINANTLY MONONUCLEAR NO ORGANISMS SEEN    Culture   Final    RARE BACTEROIDES ORALIS BETA LACTAMASE POSITIVE CRITICAL RESULT CALLED TO, READ BACK BY AND VERIFIED WITH: B LICUANA,RN AT 4235 3/61/44 BY L BENFIELD    Report Status 02/14/2017 FINAL  Final  Culture, blood (routine x 2)     Status: None   Collection Time: 02/07/17  8:30 AM  Result Value Ref Range Status   Specimen Description BLOOD RIGHT HAND  Final   Special Requests IN PEDIATRIC BOTTLE Blood Culture adequate volume  Final   Culture NO GROWTH 5 DAYS  Final   Report Status 02/12/2017 FINAL  Final  Culture, blood (routine x 2)     Status: None   Collection Time: 02/07/17  8:38 AM  Result Value Ref Range Status   Specimen Description BLOOD RIGHT ARM  Final   Special Requests IN PEDIATRIC BOTTLE Blood Culture adequate volume  Final   Culture NO GROWTH 5 DAYS  Final   Report Status 02/12/2017 FINAL  Final  Culture, Urine     Status: Abnormal   Collection Time: 02/08/17 11:38 AM  Result Value Ref Range Status   Specimen Description URINE, RANDOM  Final   Special Requests CX ADDED AT 0327 ON [id number]  Final   Culture >=100,000 COLONIES/mL YEAST (A)  Final   Report Status 02/10/2017 FINAL  Final      Radiology Studies: No results found.   Scheduled Meds: . amLODipine  10 mg Oral QHS  . calcitRIOL  0.25 mcg Oral Q M,W,F  . ciprofloxacin  250 mg  Oral Daily  . enoxaparin (LOVENOX) injection  30 mg Subcutaneous Daily  . ferrous sulfate  325 mg Oral BID WC  . furosemide  80 mg Oral BID  . gabapentin  100 mg Oral TID  . hydrALAZINE  100 mg Oral BID  . insulin aspart  0-9 Units Subcutaneous TID WC  . insulin glargine  15 Units Subcutaneous QAC breakfast  . mouth rinse  15 mL Mouth Rinse BID  . methocarbamol  500 mg Oral BID  . metoprolol succinate  50 mg Oral Daily  . multivitamin with minerals  1 tablet Oral Daily  . nicotine  14 mg Transdermal Daily  . pantoprazole  40 mg Oral Daily  . senna-docusate  2 tablet Oral QHS  . sertraline  100 mg Oral Daily   Continuous Infusions: . DAPTOmycin (CUBICIN)  IV Stopped (02/14/17 1315)     LOS: 9 days   Time Spent in minutes   35 minutes  Zoey Gilkeson D.O. on 02/14/2017 at 3:23 PM  Between 7am to 7pm - Pager - [phone]  After 7pm go to [email or web address] - password [email or web address]  And look for the night coverage person covering for me after hours  [facility] Office  [phone]

## 2017-02-15 ENCOUNTER — Inpatient Hospital Stay (HOSPITAL_COMMUNITY): Payer: Medicare Other

## 2017-02-15 LAB — GLUCOSE, CAPILLARY
Glucose-Capillary: 79 mg/dL (ref 65–99)
Glucose-Capillary: 139 mg/dL — ABNORMAL HIGH (ref 65–99)
Glucose-Capillary: 108 mg/dL — ABNORMAL HIGH (ref 65–99)
Glucose-Capillary: 140 mg/dL — ABNORMAL HIGH (ref 65–99)

## 2017-02-15 MED ORDER — SODIUM CHLORIDE 0.9% FLUSH
10.0000 mL | INTRAVENOUS | Status: DC | PRN
Start: 2017-02-15 — End: 2017-02-18
  Administered 2017-02-15 – 2017-02-17 (×3): 10 mL
  Filled 2017-02-15 (×3): qty 40

## 2017-02-15 NOTE — Progress Notes (Signed)
R subclavian tunneled PICC noted to be out 2 cm.  Per original documentation, PICC was left at 0.  Recommend CXR to confirm placement.  Site WNL, GBR and flush.

## 2017-02-15 NOTE — Progress Notes (Signed)
PROGRESS NOTE    Brandy Houston  FGH:[id number] DOB: [birth year] DOA: 02/05/2017 PCP: [name], MD   No chief complaint on file.  Brief Narrative:  HPI On 02/05/2017 Brandy Houston is a 61 y.o. female with a medical history of diastolic heart failure, chronic kidney disease, diabetes, hypertension, septic arthritis, presented to the hospital from Dr. [name] office (ortho). Patient presented to his office on 02/03/2017 for left knee pain, status post aspiration. Fluid showed greater than 58,000 WBC. Patient states over the past several months, she's been having problems walking and her knee has been popping out of place. Currently patient denies any chest pain, shortness breath, abdominal pain, nausea vomiting, diarrhea or constipation, dizziness or headache. She denies any recent trauma or falls. Denies any recent travel or ill contacts. Patient has been seeing a hematologist for hemarthrosis and has had several bloody knee effusions. Per documentation, unlikely to be coagulopathy. Patient has not been on any antibiotics recently.   Interim history Underwent left total knee excision with placement of antibiotic spacer. Currently on daptomycin and ciprofloxacin. 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 [name] -s/p aspiration yielding 120 mL's of bloody fluid (on 02/03/2017) -Fluid shows 58,000 WBC. Gram stain showed no organisms -s/p left total knee excision arthroplasty and placement of articulating methylmethacrylate antibiotic spacer; debridement of bone, muscle and skin 15x12cm; wound repair left knee 15cm -ID on board and assisting with case.  -Continue daptomycin and ciprofloxacin, will need IV daptomycin for 6 weeks per infectious disease recommendations -Central line in place  Fever  - Resolved.   Leukocytosis  - WBC improving today to 10.8 - likely  reactive to recent surgery vs possible knee infection - Currently on daptomycin and cipro - Blood cultures show no growth to date - CXR unremarkable for infection - UA TNTC WBC, large leukocyte, few bacteria (patient not complaining of any urinary symptoms) - Urine culture >100k yeast, patient has no complaints of urinary symptoms  - wound culture shows no growth to date - Continue to monitor CBC  Chronic diastolic heart failure -Echocardiogram February 2016 showed a grade 2 diastolic dysfunction -Currently patient appears to be euvolemic and compensated -Takes metolazone and Lasix at home- currently held due to AKI -Patient was given IV lasix between blood transfusions to avoid volume overload on admission -will give additional dose of lasix today as patient will receive 2uPRBC -Monitor intake and output, daily weights -urine output over past 24hrs 1050cc  Acute kidney injury vs Chronic kidney disease, stage IV - Serum creatinine continues to improve. Will continue to monitor.  Diabetes mellitus, type II with neuropathy -Continue Lantus, insulin sliding scale was CBG monitoring -Hemoglobin A1c 6.2 -Continue gabapentin for neuropathy  Essential hypertension -Continue hydralazine -metoprolol and amlodipine discontinued  Depression -Continue Zoloft  Tobacco abuse -Smoking cessation discussed -Continue Nicotine patch  Anemia of chronic disease/ hemarthrosis/ Anemia due to blood loss -Hemoglobin was 7.1 on admission (baseline hemoglobin 9 over the past year) -Has had a history of hemarthrosis and was seeing oncology. Patient was receiving Aranesp injections every 30 days. -Patient denies any complaints of melena or hematochezia -Recently had anemia panel on 02/02/2017 which showed iron of 18, ferritin of 52 -Given 7uPRBC since admission -Hemoglobin currently 9.1 -Continue iron supplementation  -Continue to monitor CBC  Hepatitis C -Per patient, stable -Continue  outpatient follow up  DVT Prophylaxis  SCDs/lovenox  Code Status: Full  Family Communication: None at bedside.   Disposition Plan: With improvement in condition  Consultants Orthopedic surgery, Dr. [name] Nephrology Infectious disease Interventional radiology  Procedures  Left total knee excision arthroplasty and placement of articulating methylmethacrylate antibiotic spacer; debridement of bone, muscle and skin 15x12cm; wound repair left knee 15cm  Antibiotics   Anti-infectives    Start     Dose/Rate Route Frequency Ordered Stop   02/10/17 1052  DAPTOmycin (CUBICIN) 420 mg in sodium chloride 0.9 % IVPB     420 mg 216.8 mL/hr over 30 Minutes Intravenous Every 48 hours 02/08/17 1255     02/09/17 1700  ciprofloxacin (CIPRO) tablet 250 mg     250 mg Oral Daily 02/09/17 1552     02/08/17 1600  vancomycin (VANCOCIN) IVPB 1000 mg/200 mL premix  Status:  Discontinued     1,000 mg 200 mL/hr over 60 Minutes Intravenous Every 48 hours 02/06/17 2214 02/07/17 1717   02/08/17 1000  DAPTOmycin (CUBICIN) 420 mg in sodium chloride 0.9 % IVPB  Status:  Discontinued     420 mg 216.8 mL/hr over 30 Minutes Intravenous Every 24 hours 02/07/17 1805 02/08/17 1255   02/06/17 1806  tobramycin (NEBCIN) powder  Status:  Discontinued       As needed 02/06/17 1841 02/06/17 1900   02/06/17 1610  vancomycin (VANCOCIN) powder  Status:  Discontinued       As needed 02/06/17 1611 02/06/17 1900   02/06/17 1458  vancomycin (VANCOCIN) 1-5 GM/200ML-% IVPB    Comments:  Schonewitz, Leigh   : cabinet override      02/06/17 1458 02/07/17 0259      Subjective:   Brandy Houston  No new complaints reported to me  Objective:   Vitals:   02/14/17 2008 02/15/17 0447 02/15/17 0456 02/15/17 1031  BP: (!) 130/51  130/60   Pulse: 78  89   Resp: 19  19   Temp: 98.2 F (36.8 C)  98.3 F (36.8 C)   TempSrc: Oral  Oral   SpO2: 95%  98%   Weight:  69.9 kg (154 lb 1.6 oz)  70.2 kg (154 lb 12.8 oz)  Height:         Intake/Output Summary (Last 24 hours) at 02/15/17 1544 Last data filed at 02/15/17 1138  Gross per 24 hour  Intake              900 ml  Output             1000 ml  Net             -100 ml   Filed Weights   02/14/17 0500 02/15/17 0447 02/15/17 1031  Weight: 71.5 kg (157 lb 9.6 oz) 69.9 kg (154 lb 1.6 oz) 70.2 kg (154 lb 12.8 oz)   ExamExam unchanged when compared to 02/14/2017  General: Well developed, well nourished, NAD  HEENT: NCAT, mucous membranes moist. atraumatic  Cardiovascular: S1 S2 auscultated, no rubs, murmurs or gallops. Regular rate and rhythm.  Respiratory: Clear to auscultation bilaterally with equal chest rise  Abdomen: Soft, nontender, nondistended, + bowel sounds  Extremities: warm dry without cyanosis clubbing or edema RLE. LLE with dressing with brace.   Neuro: AAOx3, nonfocal  Psych: Appropriate mood and insight, pleasant  Data Reviewed: I have personally reviewed following labs and imaging studies  CBC:  Recent Labs Lab 02/09/17 0308 02/10/17 0624 02/11/17 0503  WBC 12.0* 10.8* 8.3  HGB 7.7*  9.1* 8.9*  HCT 24.0* 28.8* 27.1*  MCV 88.2 86.7 86.9  PLT 357 331 440   Basic Metabolic Panel:  Recent Labs Lab 02/09/17 0308 02/10/17 0624 02/11/17 0503 02/12/17 0340  NA 135 136 140 137  K 4.3 4.1 3.8 3.9  CL 105 107 108 107  CO2 20* 20* 21* 22  GLUCOSE 116* 75 83 96  BUN 74* 74* 66* 59*  CREATININE 3.88* 3.82* 3.50* 3.28*  CALCIUM 8.2* 8.4* 8.7* 8.7*  PHOS  --   --   --  5.0*   GFR: Estimated Creatinine Clearance: 17.3 mL/min (A) (by C-G formula based on SCr of 3.28 mg/dL (H)). Liver Function Tests:  Recent Labs Lab 02/12/17 0340  ALBUMIN 2.0*   No results for input(s): LIPASE, AMYLASE in the last 168 hours. No results for input(s): AMMONIA in the last 168 hours. Coagulation Profile: No results for input(s): INR, PROTIME in the last 168 hours. Cardiac Enzymes: No results for input(s): CKTOTAL, CKMB, CKMBINDEX, TROPONINI in  the last 168 hours. BNP (last 3 results) No results for input(s): PROBNP in the last 8760 hours. HbA1C: No results for input(s): HGBA1C in the last 72 hours. CBG:  Recent Labs Lab 02/14/17 1206 02/14/17 1701 02/14/17 2130 02/15/17 0801 02/15/17 1220  GLUCAP 197* 162* 99 79 139*   Lipid Profile: No results for input(s): CHOL, HDL, LDLCALC, TRIG, CHOLHDL, LDLDIRECT in the last 72 hours. Thyroid Function Tests: No results for input(s): TSH, T4TOTAL, FREET4, T3FREE, THYROIDAB in the last 72 hours. Anemia Panel: No results for input(s): VITAMINB12, FOLATE, FERRITIN, TIBC, IRON, RETICCTPCT in the last 72 hours. Urine analysis:    Component Value Date/Time   COLORURINE AMBER (A) 02/08/2017 1138   APPEARANCEUR TURBID (A) 02/08/2017 1138   LABSPEC 1.014 02/08/2017 1138   PHURINE 5.0 02/08/2017 1138   GLUCOSEU NEGATIVE 02/08/2017 1138   HGBUR MODERATE (A) 02/08/2017 1138   BILIRUBINUR NEGATIVE 02/08/2017 1138   KETONESUR NEGATIVE 02/08/2017 1138   PROTEINUR 100 (A) 02/08/2017 1138   UROBILINOGEN 0.2 09/02/2014 1504   NITRITE NEGATIVE 02/08/2017 1138   LEUKOCYTESUR LARGE (A) 02/08/2017 1138   Sepsis Labs: @LABRCNTIP (procalcitonin:4,lacticidven:4)  ) Recent Results (from the past 240 hour(s))  Surgical pcr screen     Status: None   Collection Time: 02/05/17  9:20 PM  Result Value Ref Range Status   MRSA, PCR NEGATIVE NEGATIVE Final   Staphylococcus aureus NEGATIVE NEGATIVE Final    Comment:        The Xpert SA Assay (FDA approved for NASAL specimens in patients over 25 years of age), is one component of a comprehensive surveillance program.  Test performance has been validated by [facility] for patients greater than or equal to 51 year old. It is not intended to diagnose infection nor to guide or monitor treatment.   Aerobic/Anaerobic Culture (surgical/deep wound)     Status: None   Collection Time: 02/06/17  4:22 PM  Result Value Ref Range Status   Specimen  Description TISSUE LEFT KNEE  Final   Special Requests NONE  Final   Gram Stain   Final    MODERATE WBC PRESENT,BOTH PMN AND MONONUCLEAR NO ORGANISMS SEEN    Culture No growth aerobically or anaerobically.  Final   Report Status 02/11/2017 FINAL  Final  Aerobic/Anaerobic Culture (surgical/deep wound)     Status: None   Collection Time: 02/06/17  5:01 PM  Result Value Ref Range Status   Specimen Description TISSUE  Final   Special Requests LEFT FEMORAL  CANNEL  Final   Gram Stain   Final    FEW WBC PRESENT, PREDOMINANTLY MONONUCLEAR NO ORGANISMS SEEN    Culture   Final    RARE BACTEROIDES ORALIS BETA LACTAMASE POSITIVE CRITICAL RESULT CALLED TO, READ BACK BY AND VERIFIED WITH: B LICUANA,RN AT 8938 06/16/73 BY L BENFIELD    Report Status 02/14/2017 FINAL  Final  Culture, blood (routine x 2)     Status: None   Collection Time: 02/07/17  8:30 AM  Result Value Ref Range Status   Specimen Description BLOOD RIGHT HAND  Final   Special Requests IN PEDIATRIC BOTTLE Blood Culture adequate volume  Final   Culture NO GROWTH 5 DAYS  Final   Report Status 02/12/2017 FINAL  Final  Culture, blood (routine x 2)     Status: None   Collection Time: 02/07/17  8:38 AM  Result Value Ref Range Status   Specimen Description BLOOD RIGHT ARM  Final   Special Requests IN PEDIATRIC BOTTLE Blood Culture adequate volume  Final   Culture NO GROWTH 5 DAYS  Final   Report Status 02/12/2017 FINAL  Final  Culture, Urine     Status: Abnormal   Collection Time: 02/08/17 11:38 AM  Result Value Ref Range Status   Specimen Description URINE, RANDOM  Final   Special Requests CX ADDED AT 0327 ON 102585  Final   Culture >=100,000 COLONIES/mL YEAST (A)  Final   Report Status 02/10/2017 FINAL  Final      Radiology Studies: No results found.   Scheduled Meds: . amLODipine  10 mg Oral QHS  . calcitRIOL  0.25 mcg Oral Q M,W,F  . ciprofloxacin  250 mg Oral Daily  . enoxaparin (LOVENOX) injection  30 mg  Subcutaneous Daily  . ferrous sulfate  325 mg Oral BID WC  . furosemide  80 mg Oral BID  . gabapentin  100 mg Oral TID  . hydrALAZINE  100 mg Oral BID  . insulin aspart  0-9 Units Subcutaneous TID WC  . insulin glargine  15 Units Subcutaneous QAC breakfast  . mouth rinse  15 mL Mouth Rinse BID  . methocarbamol  500 mg Oral BID  . metoprolol succinate  50 mg Oral Daily  . multivitamin with minerals  1 tablet Oral Daily  . nicotine  14 mg Transdermal Daily  . pantoprazole  40 mg Oral Daily  . senna-docusate  2 tablet Oral QHS  . sertraline  100 mg Oral Daily   Continuous Infusions: . DAPTOmycin (CUBICIN)  IV Stopped (02/14/17 1315)     LOS: 10 days   Time Spent in minutes   35 minutes  Garry Bochicchio D.O. on 02/15/2017 at 3:44 PM  Between 7am to 7pm - Pager - [phone]  After 7pm go to [email or web address] - password [email or web address]  And look for the night coverage person covering for me after hours  [facility] Office  [phone]

## 2017-02-16 LAB — GLUCOSE, CAPILLARY
Glucose-Capillary: 92 mg/dL (ref 65–99)
Glucose-Capillary: 141 mg/dL — ABNORMAL HIGH (ref 65–99)
Glucose-Capillary: 151 mg/dL — ABNORMAL HIGH (ref 65–99)
Glucose-Capillary: 80 mg/dL (ref 65–99)

## 2017-02-16 LAB — BASIC METABOLIC PANEL
Anion gap: 8 (ref 5–15)
BUN: 60 mg/dL — ABNORMAL HIGH (ref 6–20)
CO2: 26 mmol/L (ref 22–32)
Calcium: 8.7 mg/dL — ABNORMAL LOW (ref 8.9–10.3)
Chloride: 104 mmol/L (ref 101–111)
Creatinine, Ser: 3.6 mg/dL — ABNORMAL HIGH (ref 0.44–1.00)
GFR calc Af Amer: 15 mL/min — ABNORMAL LOW (ref >60)
GFR calc non Af Amer: 13 mL/min — ABNORMAL LOW (ref >60)
Glucose, Bld: 98 mg/dL (ref 65–99)
Potassium: 3.6 mmol/L (ref 3.5–5.1)
Sodium: 138 mmol/L (ref 135–145)

## 2017-02-16 LAB — CK: Total CK: 38 U/L (ref 38–234)

## 2017-02-16 NOTE — Progress Notes (Signed)
PROGRESS NOTE    Brandy Houston  NAT:[id number] DOB: 07/19/1955 DOA: 02/05/2017 PCP: [name], MD   No chief complaint on file.  Brief Narrative:  HPI On 02/05/2017 Brandy Houston is a 61 y.o. female with a medical history of diastolic heart failure, chronic kidney disease, diabetes, hypertension, septic arthritis, presented to the hospital from Dr. [name] office (ortho). Patient presented to his office on 02/03/2017 for left knee pain, status post aspiration. Fluid showed greater than 58,000 WBC. Patient states over the past several months, she's been having problems walking and her knee has been popping out of place. Currently patient denies any chest pain, shortness breath, abdominal pain, nausea vomiting, diarrhea or constipation, dizziness or headache. She denies any recent trauma or falls. Denies any recent travel or ill contacts. Patient has been seeing a hematologist for hemarthrosis and has had several bloody knee effusions. Per documentation, unlikely to be coagulopathy. Patient has not been on any antibiotics recently.   Interim history Underwent left total knee excision with placement of antibiotic spacer. Currently on daptomycin and ciprofloxacin. 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 [name] -s/p aspiration yielding 120 mL's of bloody fluid (on 02/03/2017) -Fluid shows 58,000 WBC. Gram stain showed no organisms -s/p left total knee excision arthroplasty and placement of articulating methylmethacrylate antibiotic spacer; debridement of bone, muscle and skin 15x12cm; wound repair left knee 15cm -ID on board and assisting with case.  -Continue daptomycin and ciprofloxacin, will need IV daptomycin for 6 weeks per infectious disease recommendations, although we are waiting final recommendations from ID once knee culture results available. -Central  line in place  Fever  - Resolved.   Leukocytosis  - WBC improving today to 10.8 - likely reactive to recent surgery vs possible knee infection - Currently on daptomycin and cipro - Blood cultures show no growth to date - CXR unremarkable for infection - UA TNTC WBC, large leukocyte, few bacteria (patient not complaining of any urinary symptoms) - Urine culture >100k yeast, patient has no complaints of urinary symptoms  - wound culture shows no growth to date - Continue to monitor CBC  Chronic diastolic heart failure -Echocardiogram February 2016 showed a grade 2 diastolic dysfunction -Currently patient appears to be euvolemic and compensated -Takes metolazone and Lasix at home- currently held due to AKI -Patient was given IV lasix between blood transfusions to avoid volume overload on admission -will give additional dose of lasix today as patient will receive 2uPRBC -Monitor intake and output, daily weights -urine output over past 24hrs 1050cc  Acute kidney injury vs Chronic kidney disease, stage IV - Serum creatinine stable at baseline. AKI resolved.  Diabetes mellitus, type II with neuropathy -Continue Lantus, insulin sliding scale was CBG monitoring -Hemoglobin A1c 6.2 -Continue gabapentin for neuropathy  Essential hypertension -Continue hydralazine -metoprolol and amlodipine discontinued  Depression -stable on Zoloft  Tobacco abuse -Smoking cessation discussed -Continue Nicotine patch  Anemia of chronic disease/ hemarthrosis/ Anemia due to blood loss -Hemoglobin was 7.1 on admission (baseline hemoglobin 9 over the past year) -Has had a history of hemarthrosis and was seeing oncology. Patient was receiving Aranesp injections every 30 days. -Patient denies any complaints of melena or hematochezia -Recently had anemia panel on 02/02/2017 which showed iron of 18, ferritin of 52 -Given 7uPRBC since admission -Hemoglobin near 9.0 and stable  Hepatitis C -Per  patient,  stable -Continue outpatient follow up  DVT Prophylaxis  SCDs/lovenox  Code Status: Full  Family Communication: None at bedside.   Disposition Plan: With improvement in condition  Consultants Orthopedic surgery, Dr. [name] Nephrology Infectious disease Interventional radiology  Procedures  Left total knee excision arthroplasty and placement of articulating methylmethacrylate antibiotic spacer; debridement of bone, muscle and skin 15x12cm; wound repair left knee 15cm  Antibiotics   Anti-infectives    Start     Dose/Rate Route Frequency Ordered Stop   02/10/17 1052  DAPTOmycin (CUBICIN) 420 mg in sodium chloride 0.9 % IVPB     420 mg 216.8 mL/hr over 30 Minutes Intravenous Every 48 hours 02/08/17 1255     02/09/17 1700  ciprofloxacin (CIPRO) tablet 250 mg     250 mg Oral Daily 02/09/17 1552     02/08/17 1600  vancomycin (VANCOCIN) IVPB 1000 mg/200 mL premix  Status:  Discontinued     1,000 mg 200 mL/hr over 60 Minutes Intravenous Every 48 hours 02/06/17 2214 02/07/17 1717   02/08/17 1000  DAPTOmycin (CUBICIN) 420 mg in sodium chloride 0.9 % IVPB  Status:  Discontinued     420 mg 216.8 mL/hr over 30 Minutes Intravenous Every 24 hours 02/07/17 1805 02/08/17 1255   02/06/17 1806  tobramycin (NEBCIN) powder  Status:  Discontinued       As needed 02/06/17 1841 02/06/17 1900   02/06/17 1610  vancomycin (VANCOCIN) powder  Status:  Discontinued       As needed 02/06/17 1611 02/06/17 1900   02/06/17 1458  vancomycin (VANCOCIN) 1-5 GM/200ML-% IVPB    Comments:  Schonewitz, Leigh   : cabinet override      02/06/17 1458 02/07/17 0259      Subjective:   [name]  She reports no new complaints.  Objective:   Vitals:   02/15/17 1031 02/15/17 1629 02/15/17 2027 02/16/17 0442  BP:  140/71 (!) 144/70   Pulse:  84 88   Resp:  18 19   Temp:  98.6 F (37 C) 98.6 F (37 C)   TempSrc:  Oral Oral   SpO2:  99% 98%   Weight: 70.2 kg (154 lb 12.8 oz)   70.7 kg (155 lb 13.8 oz)    Height:        Intake/Output Summary (Last 24 hours) at 02/16/17 1424 Last data filed at 02/16/17 1333  Gross per 24 hour  Intake             1810 ml  Output             1451 ml  Net              359 ml   Filed Weights   02/15/17 0447 02/15/17 1031 02/16/17 0442  Weight: 69.9 kg (154 lb 1.6 oz) 70.2 kg (154 lb 12.8 oz) 70.7 kg (155 lb 13.8 oz)   ExamExam unchanged when compared to 02/15/2017  General: Well developed, well nourished, NAD  HEENT: NCAT, mucous membranes moist. atraumatic  Cardiovascular: S1 S2 auscultated, no rubs, murmurs or gallops. Regular rate and rhythm.  Respiratory: Clear to auscultation bilaterally with equal chest rise  Abdomen: Soft, nontender, nondistended, + bowel sounds  Extremities: warm dry without cyanosis clubbing or edema RLE. LLE with dressing with brace.   Neuro: AAOx3, nonfocal  Psych: Appropriate mood and insight, pleasant  Data Reviewed: I have personally reviewed following labs and imaging studies  CBC:  Recent Labs Lab 02/10/17 0624 02/11/17 0503  WBC 10.8* 8.3  HGB 9.1* 8.9*  HCT 28.8* 27.1*  MCV 86.7 86.9  PLT 331 203   Basic Metabolic Panel:  Recent Labs Lab 02/10/17 0624 02/11/17 0503 02/12/17 0340 02/16/17 0503  NA 136 140 137 138  K 4.1 3.8 3.9 3.6  CL 107 108 107 104  CO2 20* 21* 22 26  GLUCOSE 75 83 96 98  BUN 74* 66* 59* 60*  CREATININE 3.82* 3.50* 3.28* 3.60*  CALCIUM 8.4* 8.7* 8.7* 8.7*  PHOS  --   --  5.0*  --    GFR: Estimated Creatinine Clearance: 15.8 mL/min (A) (by C-G formula based on SCr of 3.6 mg/dL (H)). Liver Function Tests:  Recent Labs Lab 02/12/17 0340  ALBUMIN 2.0*   No results for input(s): LIPASE, AMYLASE in the last 168 hours. No results for input(s): AMMONIA in the last 168 hours. Coagulation Profile: No results for input(s): INR, PROTIME in the last 168 hours. Cardiac Enzymes:  Recent Labs Lab 02/16/17 0503  CKTOTAL 38   BNP (last 3 results) No results for  input(s): PROBNP in the last 8760 hours. HbA1C: No results for input(s): HGBA1C in the last 72 hours. CBG:  Recent Labs Lab 02/15/17 1220 02/15/17 1828 02/15/17 2140 02/16/17 0802 02/16/17 1202  GLUCAP 139* 108* 140* 92 141*   Lipid Profile: No results for input(s): CHOL, HDL, LDLCALC, TRIG, CHOLHDL, LDLDIRECT in the last 72 hours. Thyroid Function Tests: No results for input(s): TSH, T4TOTAL, FREET4, T3FREE, THYROIDAB in the last 72 hours. Anemia Panel: No results for input(s): VITAMINB12, FOLATE, FERRITIN, TIBC, IRON, RETICCTPCT in the last 72 hours. Urine analysis:    Component Value Date/Time   COLORURINE AMBER (A) 02/08/2017 1138   APPEARANCEUR TURBID (A) 02/08/2017 1138   LABSPEC 1.014 02/08/2017 1138   PHURINE 5.0 02/08/2017 1138   GLUCOSEU NEGATIVE 02/08/2017 1138   HGBUR MODERATE (A) 02/08/2017 1138   BILIRUBINUR NEGATIVE 02/08/2017 1138   KETONESUR NEGATIVE 02/08/2017 1138   PROTEINUR 100 (A) 02/08/2017 1138   UROBILINOGEN 0.2 09/02/2014 1504   NITRITE NEGATIVE 02/08/2017 1138   LEUKOCYTESUR LARGE (A) 02/08/2017 1138   Sepsis Labs: @LABRCNTIP (procalcitonin:4,lacticidven:4)  ) Recent Results (from the past 240 hour(s))  Aerobic/Anaerobic Culture (surgical/deep wound)     Status: None   Collection Time: 02/06/17  4:22 PM  Result Value Ref Range Status   Specimen Description TISSUE LEFT KNEE  Final   Special Requests NONE  Final   Gram Stain   Final    MODERATE WBC PRESENT,BOTH PMN AND MONONUCLEAR NO ORGANISMS SEEN    Culture No growth aerobically or anaerobically.  Final   Report Status 02/11/2017 FINAL  Final  Aerobic/Anaerobic Culture (surgical/deep wound)     Status: None   Collection Time: 02/06/17  5:01 PM  Result Value Ref Range Status   Specimen Description TISSUE  Final   Special Requests LEFT FEMORAL CANNEL  Final   Gram Stain   Final    FEW WBC PRESENT, PREDOMINANTLY MONONUCLEAR NO ORGANISMS SEEN    Culture   Final    RARE BACTEROIDES  ORALIS BETA LACTAMASE POSITIVE CRITICAL RESULT CALLED TO, READ BACK BY AND VERIFIED WITH: B LICUANA,RN AT 5597 09/29/36 BY L BENFIELD    Report Status 02/14/2017 FINAL  Final  Culture, blood (routine x 2)     Status: None   Collection Time: 02/07/17  8:30 AM  Result Value Ref Range Status   Specimen Description BLOOD RIGHT HAND  Final   Special Requests IN PEDIATRIC BOTTLE Blood Culture adequate volume  Final   Culture NO GROWTH 5 DAYS  Final   Report Status 02/12/2017 FINAL  Final  Culture, blood (routine x 2)     Status: None   Collection Time: 02/07/17  8:38 AM  Result Value Ref Range Status   Specimen Description BLOOD RIGHT ARM  Final   Special Requests IN PEDIATRIC BOTTLE Blood Culture adequate volume  Final   Culture NO GROWTH 5 DAYS  Final   Report Status 02/12/2017 FINAL  Final  Culture, Urine     Status: Abnormal   Collection Time: 02/08/17 11:38 AM  Result Value Ref Range Status   Specimen Description URINE, RANDOM  Final   Special Requests CX ADDED AT 0327 ON [id number]  Final   Culture >=100,000 COLONIES/mL YEAST (A)  Final   Report Status 02/10/2017 FINAL  Final      Radiology Studies: Dg Chest Port 1 View  Result Date: 02/15/2017 CLINICAL DATA:  Evaluate PICC line. EXAM: PORTABLE CHEST 1 VIEW COMPARISON:  02/07/2017 and prior exams FINDINGS: A right IJ PICC line is present with tip overlying the superior cavoatrial junction. Cardiomegaly is noted. There is no evidence of focal airspace disease, pulmonary edema, suspicious pulmonary nodule/mass, pleural effusion, or pneumothorax. No acute bony abnormalities are identified. IMPRESSION: Right PICC line with tip overlying the superior cavoatrial junction. Cardiomegaly without acute cardiopulmonary disease. Electronically Signed   By: Margarette Canada M.D.   On: 02/15/2017 17:18     Scheduled Meds: . amLODipine  10 mg Oral QHS  . calcitRIOL  0.25 mcg Oral Q M,W,F  . ciprofloxacin  250 mg Oral Daily  . enoxaparin (LOVENOX)  injection  30 mg Subcutaneous Daily  . ferrous sulfate  325 mg Oral BID WC  . furosemide  80 mg Oral BID  . gabapentin  100 mg Oral TID  . hydrALAZINE  100 mg Oral BID  . insulin aspart  0-9 Units Subcutaneous TID WC  . insulin glargine  15 Units Subcutaneous QAC breakfast  . mouth rinse  15 mL Mouth Rinse BID  . methocarbamol  500 mg Oral BID  . metoprolol succinate  50 mg Oral Daily  . multivitamin with minerals  1 tablet Oral Daily  . nicotine  14 mg Transdermal Daily  . pantoprazole  40 mg Oral Daily  . senna-docusate  2 tablet Oral QHS  . sertraline  100 mg Oral Daily   Continuous Infusions: . DAPTOmycin (CUBICIN)  IV Stopped (02/16/17 1310)     LOS: 11 days   Time Spent in minutes   35 minutes  Zacheriah Stumpe D.O. on 02/16/2017 at 2:24 PM  Between 7am to 7pm - Pager - [phone]  After 7pm go to [email or web address] - password [email or web address]  And look for the night coverage person covering for me after hours  [facility] Office  [phone]

## 2017-02-16 NOTE — Progress Notes (Signed)
Physical Therapy Treatment Patient Details Name: Brandy Houston MRN: [id number] DOB: Sep 18, 1955 Today's Date: 02/16/2017    History of Present Illness 61 y.o. 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 Presented to the hospital from Dr. [name] office (ortho) status post aspiration with elevated WBC. ADmitted with septic knee. 8/24 Pt s/p Lt TKA excision arthroplasty with spacer placed    PT Comments    Pt performed increased gait and functional mobility.  Pt required decreased assistance during transfers but remains to require min assist during gait training.  Fatigue noted during gait training and patient required increased VCs to maintain weight bearing order.  Plan to SNF for short term rehab remains appropriate until strength improves and patient can manage her new weight bearing order safely.  At this time patient remains a fall risk.     Follow Up Recommendations  SNF;Supervision/Assistance - 24 hour     Equipment Recommendations  Other (comment) (TBD next venue)    Recommendations for Other Services       Precautions / Restrictions Precautions Precautions: Fall Required Braces or Orthoses: Knee Immobilizer - Left Restrictions Weight Bearing Restrictions: Yes LLE Weight Bearing: Touchdown weight bearing    Mobility  Bed Mobility               General bed mobility comments: Pt in recliner on arrival.  Transfers Overall transfer level: Needs assistance Equipment used: Rolling walker (2 wheeled) Transfers: Sit to/from Stand Sit to Stand: Min guard         General transfer comment: No physical assistance needed.  Cues for hand placement and compliance with weight bearing order.    Ambulation/Gait Ambulation/Gait assistance: Min assist Ambulation Distance (Feet): 45 Feet Assistive device: Rolling walker (2 wheeled) Gait Pattern/deviations: Step-to pattern;Decreased  stride length;Trunk flexed     General Gait Details: cues for sequencing, maintaining TDWB, and proximity of RW; Cues to keep RW still when advancing steps forward.  Pt fatigues with increased gait distance.  Pt putting more forefoot down and required cues to correct.     Stairs            Wheelchair Mobility    Modified Rankin (Stroke Patients Only)       Balance     Sitting balance-Leahy Scale: Good       Standing balance-Leahy Scale: Poor                              Cognition Arousal/Alertness: Awake/alert Behavior During Therapy: WFL for tasks assessed/performed Overall Cognitive Status: Within Functional Limits for tasks assessed                                        Exercises      General Comments        Pertinent Vitals/Pain Pain Assessment: 0-10 Pain Score: 2  Pain Location: left knee with mobility Pain Descriptors / Indicators: Sore;Grimacing;Guarding Pain Intervention(s): Monitored during session;Repositioned    Home Living                      Prior Function            PT Goals (current goals can now be found in the care plan section) Acute Rehab PT Goals Patient Stated Goal: return  home Potential to Achieve Goals: Good Progress towards PT goals: Progressing toward goals    Frequency    Min 3X/week      PT Plan Current plan remains appropriate    Co-evaluation              AM-PAC PT "6 Clicks" Daily Activity  Outcome Measure  Difficulty turning over in bed (including adjusting bedclothes, sheets and blankets)?: A Little Difficulty moving from lying on back to sitting on the side of the bed? : A Little Difficulty sitting down on and standing up from a chair with arms (e.g., wheelchair, bedside commode, etc,.)?: A Lot Help needed moving to and from a bed to chair (including a wheelchair)?: A Little Help needed walking in hospital room?: A Little Help needed climbing 3-5 steps with  a railing? : Total 6 Click Score: 15    End of Session Equipment Utilized During Treatment: Gait belt;Left knee immobilizer Activity Tolerance: Patient tolerated treatment well Patient left: with call bell/phone within reach;with bed alarm set;in chair Nurse Communication: Mobility status PT Visit Diagnosis: Difficulty in walking, not elsewhere classified (R26.2);Muscle weakness (generalized) (M62.81);Other abnormalities of gait and mobility (R26.89);Pain Pain - Right/Left: Left Pain - part of body: Knee     Time: 4862-8241 PT Time Calculation (min) (ACUTE ONLY): 15 min  Charges:  $Gait Training: 8-22 mins                    G Codes:       Governor Rooks, PTA pager [phone]    Cristela Blue 02/16/2017, 4:28 PM

## 2017-02-16 NOTE — Progress Notes (Signed)
CSW following for SNF placement when stable for DC- per MD awaiting final antibiotic recommendations from ID prior to [place], [place] Social Worker [phone]

## 2017-02-17 ENCOUNTER — Telehealth (INDEPENDENT_AMBULATORY_CARE_PROVIDER_SITE_OTHER): Payer: Self-pay | Admitting: Orthopaedic Surgery

## 2017-02-17 LAB — GLUCOSE, CAPILLARY
Glucose-Capillary: 81 mg/dL (ref 65–99)
Glucose-Capillary: 136 mg/dL — ABNORMAL HIGH (ref 65–99)
Glucose-Capillary: 139 mg/dL — ABNORMAL HIGH (ref 65–99)
Glucose-Capillary: 142 mg/dL — ABNORMAL HIGH (ref 65–99)

## 2017-02-17 NOTE — Progress Notes (Signed)
PROGRESS NOTE    Brandy Houston  GGY:[id number] DOB: 03-29-1956 DOA: 02/05/2017 PCP: [name], MD   No chief complaint on file.  Brief Narrative:  HPI On 02/05/2017 Brandy Houston is a 61 y.o. female with a medical history of diastolic heart failure, chronic kidney disease, diabetes, hypertension, septic arthritis, presented to the hospital from Dr. [name] office (ortho). Patient presented to his office on 02/03/2017 for left knee pain, status post aspiration. Fluid showed greater than 58,000 WBC. Patient states over the past several months, she's been having problems walking and her knee has been popping out of place. Currently patient denies any chest pain, shortness breath, abdominal pain, nausea vomiting, diarrhea or constipation, dizziness or headache. She denies any recent trauma or falls. Denies any recent travel or ill contacts. Patient has been seeing a hematologist for hemarthrosis and has had several bloody knee effusions. Per documentation, unlikely to be coagulopathy. Patient has not been on any antibiotics recently.   Interim history Underwent left total knee excision with placement of antibiotic spacer. Currently on daptomycin and ciprofloxacin. Nephrology consulted secondary to renal failure. ID on board and on the 31st there was report of tissue culture from her knee which was reporting anaerobic gram-negative rod. ID to reevaluate and decide whether or not to adjust antibiotic regimen.  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 [name] -s/p aspiration yielding 120 mL's of bloody fluid (on 02/03/2017) -Fluid shows 58,000 WBC. Gram stain showed no organisms -s/p left total knee excision arthroplasty and placement of articulating methylmethacrylate antibiotic spacer; debridement of bone, muscle and skin 15x12cm; wound repair left knee 15cm -ID on board and assisting with case.  -Continue daptomycin  and ciprofloxacin, will need IV daptomycin for 6 weeks per infectious disease recommendations, although we are waiting final recommendations from ID once knee culture results available. -Central line in place  Fever  - Resolved.   Leukocytosis  - WBC improving today to 10.8 - likely reactive to recent surgery vs possible knee infection - Currently on daptomycin and cipro - Blood cultures show no growth to date - CXR unremarkable for infection - UA TNTC WBC, large leukocyte, few bacteria (patient not complaining of any urinary symptoms) - Urine culture >100k yeast, patient has no complaints of urinary symptoms  - wound culture shows no growth to date - Continue to monitor CBC  Chronic diastolic heart failure -Echocardiogram February 2016 showed a grade 2 diastolic dysfunction -Currently patient appears to be euvolemic and compensated -will give additional dose of lasix today as patient will receive 2uPRBC -Monitor intake and output, daily weights - Patient is currently on Lasix   Acute kidney injury vs Chronic kidney disease, stage IV - Serum creatinine stable at baseline. AKI resolved.  Diabetes mellitus, type II with neuropathy -Continue Lantus, insulin sliding scale was CBG monitoring -Hemoglobin A1c 6.2 -Continue gabapentin for neuropathy  Essential hypertension -Continue hydralazine -metoprolol and amlodipine discontinued  Depression -stable on Zoloft  Tobacco abuse -Smoking cessation discussed -Continue Nicotine patch  Anemia of chronic disease/ hemarthrosis/ Anemia due to blood loss -Hemoglobin was 7.1 on admission (baseline hemoglobin 9 over the past year) -Has had a history of hemarthrosis and was seeing oncology. Patient was receiving Aranesp injections every 30 days. -Patient denies any complaints of melena or hematochezia -Recently had anemia panel on 02/02/2017 which showed iron of 18, ferritin of 52 -Given 7uPRBC since admission -Hemoglobin  near  9.0 and stable  Hepatitis C -Per patient, stable -Continue outpatient follow up  DVT Prophylaxis  SCDs/lovenox  Code Status: Full  Family Communication: None at bedside.   Disposition Plan: With improvement in condition  Consultants Orthopedic surgery, Dr. [name] Nephrology Infectious disease Interventional radiology  Procedures  Left total knee excision arthroplasty and placement of articulating methylmethacrylate antibiotic spacer; debridement of bone, muscle and skin 15x12cm; wound repair left knee 15cm  Antibiotics   Anti-infectives    Start     Dose/Rate Route Frequency Ordered Stop   02/10/17 1052  DAPTOmycin (CUBICIN) 420 mg in sodium chloride 0.9 % IVPB     420 mg 216.8 mL/hr over 30 Minutes Intravenous Every 48 hours 02/08/17 1255     02/09/17 1700  ciprofloxacin (CIPRO) tablet 250 mg     250 mg Oral Daily 02/09/17 1552     02/08/17 1600  vancomycin (VANCOCIN) IVPB 1000 mg/200 mL premix  Status:  Discontinued     1,000 mg 200 mL/hr over 60 Minutes Intravenous Every 48 hours 02/06/17 2214 02/07/17 1717   02/08/17 1000  DAPTOmycin (CUBICIN) 420 mg in sodium chloride 0.9 % IVPB  Status:  Discontinued     420 mg 216.8 mL/hr over 30 Minutes Intravenous Every 24 hours 02/07/17 1805 02/08/17 1255   02/06/17 1806  tobramycin (NEBCIN) powder  Status:  Discontinued       As needed 02/06/17 1841 02/06/17 1900   02/06/17 1610  vancomycin (VANCOCIN) powder  Status:  Discontinued       As needed 02/06/17 1611 02/06/17 1900   02/06/17 1458  vancomycin (VANCOCIN) 1-5 GM/200ML-% IVPB    Comments:  Schonewitz, Leigh   : cabinet override      02/06/17 1458 02/07/17 0259      Subjective:   Brandy Houston  Pt has no new complaints. No acute issues overnight.  Objective:   Vitals:   02/16/17 2132 02/17/17 0500 02/17/17 0603 02/17/17 1516  BP: (!) 157/71  (!) 147/62 (!) 137/56  Pulse: 77  67 63  Resp: 18  16 18   Temp: 99 F (37.2 C)  98.6 F (37 C) 99.3 F (37.4 C)  TempSrc:  Oral  Oral Oral  SpO2: 94%  95% 99%  Weight:  71.4 kg (157 lb 6.4 oz)    Height:        Intake/Output Summary (Last 24 hours) at 02/17/17 1851 Last data filed at 02/17/17 1630  Gross per 24 hour  Intake              610 ml  Output             1775 ml  Net            -1165 ml   Filed Weights   02/15/17 1031 02/16/17 0442 02/17/17 0500  Weight: 70.2 kg (154 lb 12.8 oz) 70.7 kg (155 lb 13.8 oz) 71.4 kg (157 lb 6.4 oz)   ExamExam unchanged when compared to 02/16/2017  General: Well developed, well nourished, NAD  HEENT: NCAT, mucous membranes moist. atraumatic  Cardiovascular: S1 S2 auscultated, no rubs, murmurs or gallops. Regular rate and rhythm.  Respiratory: Clear to auscultation bilaterally with equal chest rise  Abdomen: Soft, nontender, nondistended, + bowel sounds  Extremities: warm dry without cyanosis clubbing or edema RLE. LLE with dressing with brace.   Neuro: AAOx3, nonfocal  Psych: Appropriate mood and insight, pleasant  Data Reviewed: I have personally reviewed following labs and imaging studies  CBC:  Recent Labs Lab 02/11/17 0503  WBC 8.3  HGB 8.9*  HCT 27.1*  MCV 86.9  PLT 387   Basic Metabolic Panel:  Recent Labs Lab 02/11/17 0503 02/12/17 0340 02/16/17 0503  NA 140 137 138  K 3.8 3.9 3.6  CL 108 107 104  CO2 21* 22 26  GLUCOSE 83 96 98  BUN 66* 59* 60*  CREATININE 3.50* 3.28* 3.60*  CALCIUM 8.7* 8.7* 8.7*  PHOS  --  5.0*  --    GFR: Estimated Creatinine Clearance: 15.8 mL/min (A) (by C-G formula based on SCr of 3.6 mg/dL (H)). Liver Function Tests:  Recent Labs Lab 02/12/17 0340  ALBUMIN 2.0*   No results for input(s): LIPASE, AMYLASE in the last 168 hours. No results for input(s): AMMONIA in the last 168 hours. Coagulation Profile: No results for input(s): INR, PROTIME in the last 168 hours. Cardiac Enzymes:  Recent Labs Lab 02/16/17 0503  CKTOTAL 38   BNP (last 3 results) No results for input(s): PROBNP in the  last 8760 hours. HbA1C: No results for input(s): HGBA1C in the last 72 hours. CBG:  Recent Labs Lab 02/16/17 1801 02/16/17 2142 02/17/17 0749 02/17/17 1132 02/17/17 1612  GLUCAP 151* 80 81 136* 139*   Lipid Profile: No results for input(s): CHOL, HDL, LDLCALC, TRIG, CHOLHDL, LDLDIRECT in the last 72 hours. Thyroid Function Tests: No results for input(s): TSH, T4TOTAL, FREET4, T3FREE, THYROIDAB in the last 72 hours. Anemia Panel: No results for input(s): VITAMINB12, FOLATE, FERRITIN, TIBC, IRON, RETICCTPCT in the last 72 hours. Urine analysis:    Component Value Date/Time   COLORURINE AMBER (A) 02/08/2017 1138   APPEARANCEUR TURBID (A) 02/08/2017 1138   LABSPEC 1.014 02/08/2017 1138   PHURINE 5.0 02/08/2017 1138   GLUCOSEU NEGATIVE 02/08/2017 1138   HGBUR MODERATE (A) 02/08/2017 1138   BILIRUBINUR NEGATIVE 02/08/2017 1138   KETONESUR NEGATIVE 02/08/2017 1138   PROTEINUR 100 (A) 02/08/2017 1138   UROBILINOGEN 0.2 09/02/2014 1504   NITRITE NEGATIVE 02/08/2017 1138   LEUKOCYTESUR LARGE (A) 02/08/2017 1138   Sepsis Labs: @LABRCNTIP (procalcitonin:4,lacticidven:4)  ) Recent Results (from the past 240 hour(s))  Culture, Urine     Status: Abnormal   Collection Time: 02/08/17 11:38 AM  Result Value Ref Range Status   Specimen Description URINE, RANDOM  Final   Special Requests CX ADDED AT 0327 ON [id number]  Final   Culture >=100,000 COLONIES/mL YEAST (A)  Final   Report Status 02/10/2017 FINAL  Final      Radiology Studies: No results found.   Scheduled Meds: . amLODipine  10 mg Oral QHS  . calcitRIOL  0.25 mcg Oral Q M,W,F  . ciprofloxacin  250 mg Oral Daily  . enoxaparin (LOVENOX) injection  30 mg Subcutaneous Daily  . ferrous sulfate  325 mg Oral BID WC  . furosemide  80 mg Oral BID  . gabapentin  100 mg Oral TID  . hydrALAZINE  100 mg Oral BID  . insulin aspart  0-9 Units Subcutaneous TID WC  . insulin glargine  15 Units Subcutaneous QAC breakfast  . mouth  rinse  15 mL Mouth Rinse BID  . methocarbamol  500 mg Oral BID  . metoprolol succinate  50 mg Oral Daily  . multivitamin with minerals  1 tablet Oral Daily  . nicotine  14 mg Transdermal Daily  . pantoprazole  40 mg Oral Daily  . senna-docusate  2 tablet Oral QHS  . sertraline  100 mg Oral Daily   Continuous Infusions: .  DAPTOmycin (CUBICIN)  IV Stopped (02/16/17 1310)     LOS: 12 days   Time Spent in minutes   35 minutes  Brandy Houston D.O. on 02/17/2017 at 6:51 PM  Between 7am to 7pm - Pager - [phone]  After 7pm go to [email or web address] - password [email or web address]  And look for the night coverage person covering for me after hours  [facility] Office  [phone]

## 2017-02-17 NOTE — Telephone Encounter (Signed)
See message below °

## 2017-02-17 NOTE — Progress Notes (Signed)
Pharmacy Antibiotic Note  Brandy Houston is a 61 y.o. female admitted on 02/05/2017 with joint infection.  Pharmacy has been consulted for daptomycin dosing.  SCr trending back up 3.6, est CrCl 16 ml/min. CK 77>38. Afebrile, WBC wnl.  Plan: Continue Daptomycin 420mg  (6mg /kg) IV every 48 hours.  Continue Ciprofloxacin 250 mg po daily.  Follow renal function/HD plans, clinical progression, c/s, ID recommendations   Height: 5\' 7"  (170.2 cm) Weight: 157 lb 6.4 oz (71.4 kg) IBW/kg (Calculated) : 61.6  Temp (24hrs), Avg:98.6 F (37 C), Min:98.2 F (36.8 C), Max:99 F (37.2 C)   Recent Labs Lab 02/11/17 0503 02/12/17 0340 02/16/17 0503  WBC 8.3  --   --   CREATININE 3.50* 3.28* 3.60*    Estimated Creatinine Clearance: 15.8 mL/min (A) (by C-G formula based on SCr of 3.6 mg/dL (H)).    Allergies  Allergen Reactions  . Compazine [Prochlorperazine] Shortness Of Breath and Swelling    TONGUE SWELLS  . Shellfish-Derived Products Anaphylaxis  . Iodinated Diagnostic Agents Hives and Rash  . Omnipaque [Iohexol] Hives    Antimicrobials this admission: Cubicin 8/26 >> 8/24 Vanc >> 8/25 Cipro 8/27 >>  Dose adjustments this admission: n/a  Microbiology results: 8/21 left knee aspirate - negative 8/24 left knee tissue - rare bacteroides oralis (beta lactamase +) - may be contaminant per ID, to f/u this week 8/25 BCx - NF 8/26 UCx - > 100K yeast   Elicia Lamp, PharmD, BCPS Clinical Pharmacist Rx Phone # for today: [phone] After 3:30PM, please call Main Rx: #[id number] 02/17/2017 1:21 PM

## 2017-02-17 NOTE — Telephone Encounter (Signed)
Patient called asked if Dr [name] will come by the hospital to see her. Patient advised her antibiotic was changed yesterday. Patient said she was told the antibiotic was not working. Patient want to know where the infection is. The number to contact patient [phone]

## 2017-02-17 NOTE — Progress Notes (Signed)
Occupational Therapy Treatment Patient Details Name: Brandy Houston MRN: [id number] DOB: [birth year] Today's Date: 02/17/2017    History of present illness 61 y.o. 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 Presented to the hospital from Dr. [name] office (ortho) status post aspiration with elevated WBC. ADmitted with septic knee. 8/24 Pt s/p Lt TKA excision arthroplasty with spacer placed   OT comments  Pt progressing well in mobility for toilet transfers and seated ADL. Began education in compensatory strategies for LB bathing and dressing and availability of AE to assist. Will continue to follow.  Follow Up Recommendations  SNF;Supervision/Assistance - 24 hour    Equipment Recommendations  None recommended by OT    Recommendations for Other Services      Precautions / Restrictions Precautions Precautions: Fall Required Braces or Orthoses: Knee Immobilizer - Left Restrictions Weight Bearing Restrictions: Yes LLE Weight Bearing: Touchdown weight bearing       Mobility Bed Mobility               General bed mobility comments: Pt in recliner on arrival.  Transfers Overall transfer level: Needs assistance Equipment used: Rolling walker (2 wheeled) Transfers: Sit to/from Stand Sit to Stand: Min guard Stand pivot transfers: Min guard       General transfer comment: pt with excellent UB strength, no physical assist for required, min guard for safety    Balance Overall balance assessment: Needs assistance   Sitting balance-Leahy Scale: Good     Standing balance support: Bilateral upper extremity supported Standing balance-Leahy Scale: Poor Standing balance comment: reliant on B UE support in order to maintain TDWB on L LE                           ADL either performed or assessed with clinical judgement   ADL Overall ADL's : Needs assistance/impaired              Lower Body Bathing: Maximal assistance;Sit to/from stand Lower Body Bathing Details (indicate cue type and reason): educated in use of long handled bath sponge     Lower Body Dressing: Moderate assistance;Sitting/lateral leans Lower Body Dressing Details (indicate cue type and reason): assist for L sock, educated in availability of AE for LB dressing Toilet Transfer: Minimal assistance;Stand-pivot;BSC   Toileting- Clothing Manipulation and Hygiene: Sit to/from stand;Min guard               Vision       Perception     Praxis      Cognition Arousal/Alertness: Awake/alert Behavior During Therapy: WFL for tasks assessed/performed Overall Cognitive Status: Within Functional Limits for tasks assessed                                          Exercises     Shoulder Instructions       General Comments      Pertinent Vitals/ Pain       Pain Assessment: Faces Faces Pain Scale: Hurts little more Pain Location: left knee with mobility Pain Descriptors / Indicators: Sore;Grimacing;Guarding Pain Intervention(s): Monitored during session;Repositioned  Home Living  Prior Functioning/Environment              Frequency  Min 2X/week        Progress Toward Goals  OT Goals(current goals can now be found in the care plan section)  Progress towards OT goals: Progressing toward goals  Acute Rehab OT Goals OT Goal Formulation: With patient Time For Goal Achievement: 02/23/17 Potential to Achieve Goals: Good  Plan Discharge plan remains appropriate    Co-evaluation                 AM-PAC PT "6 Clicks" Daily Activity     Outcome Measure   Help from another person eating meals?: None Help from another person taking care of personal grooming?: None Help from another person toileting, which includes using toliet, bedpan, or urinal?: A Little Help from another person bathing  (including washing, rinsing, drying)?: A Lot Help from another person to put on and taking off regular upper body clothing?: None Help from another person to put on and taking off regular lower body clothing?: A Lot 6 Click Score: 19    End of Session Equipment Utilized During Treatment: Gait belt;Rolling walker      Activity Tolerance Patient tolerated treatment well   Patient Left in chair;with call bell/phone within reach   Nurse Communication          Time: 1254-1310 OT Time Calculation (min): 16 min  Charges: OT General Charges $OT Visit: 1 Visit OT Treatments $Self Care/Home Management : 8-22 mins  02/17/2017 Nestor Lewandowsky, OTR/L Pager: [place], Haze Boyden 02/17/2017, 2:23 PM

## 2017-02-17 NOTE — Care Management Important Message (Signed)
Important Message  Patient Details  Name: Brandy Houston MRN: [id number] Date of Birth: [birth year]   Medicare Important Message Given:  Yes    [name] 02/17/2017, 1:36 PM

## 2017-02-17 NOTE — Progress Notes (Signed)
Physical Therapy Treatment Patient Details Name: Brandy Houston MRN: [id number] DOB: [birth year] Today's Date: 02/17/2017    History of Present Illness 61 y.o. 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 Presented to the hospital from Dr. [name] office (ortho) status post aspiration with elevated WBC. ADmitted with septic knee. 8/24 Pt s/p Lt TKA excision arthroplasty with spacer placed    PT Comments    Pt performed increased gait during session with increased pain observed as well.  Pt remains to perform weight bearing status correctly with cueing when fatigued.  Informed nursing of increase in pain.  Plan for SNF remains appropriate as patient continues to fatigue quickly during session.      Follow Up Recommendations  SNF;Supervision/Assistance - 24 hour     Equipment Recommendations  Other (comment) (TBD at next venue)    Recommendations for Other Services       Precautions / Restrictions Precautions Precautions: Fall Required Braces or Orthoses: Knee Immobilizer - Left Knee Immobilizer - Left: On at all times Restrictions Weight Bearing Restrictions: Yes LLE Weight Bearing: Touchdown weight bearing    Mobility  Bed Mobility               General bed mobility comments: Pt in recliner on arrival.  Transfers Overall transfer level: Needs assistance Equipment used: Rolling walker (2 wheeled) Transfers: Sit to/from Stand Sit to Stand: Min guard Stand pivot transfers: Min guard       General transfer comment: pt with excellent UB strength, no physical assist for required, min guard for safety  Ambulation/Gait Ambulation/Gait assistance: Min assist Ambulation Distance (Feet): 70 Feet Assistive device: Rolling walker (2 wheeled) Gait Pattern/deviations: Step-to pattern;Decreased stride length;Trunk flexed     General Gait Details: cues for sequencing, maintaining TDWB,  and proximity of RW; Cues to keep RW still when advancing steps forward.  Pt fatigues with increased gait distance.  Pt putting more forefoot down and required cues to correct.  During last 15 feet of gait training patient reports increased pain.     Stairs            Wheelchair Mobility    Modified Rankin (Stroke Patients Only)       Balance Overall balance assessment: Needs assistance   Sitting balance-Leahy Scale: Good     Standing balance support: Bilateral upper extremity supported Standing balance-Leahy Scale: Poor Standing balance comment: reliant on B UE support in order to maintain TDWB on L LE                            Cognition Arousal/Alertness: Awake/alert Behavior During Therapy: WFL for tasks assessed/performed Overall Cognitive Status: Within Functional Limits for tasks assessed                                        Exercises      General Comments        Pertinent Vitals/Pain Pain Assessment: 0-10 Pain Score: 9  Faces Pain Scale: Hurts little more Pain Location: left knee with mobility Pain Descriptors / Indicators: Sore;Grimacing;Guarding Pain Intervention(s): Monitored during session;Repositioned;Ice applied    Home Living                      Prior Function  PT Goals (current goals can now be found in the care plan section) Acute Rehab PT Goals Patient Stated Goal: return home Potential to Achieve Goals: Good Progress towards PT goals: Progressing toward goals    Frequency    Min 3X/week      PT Plan Current plan remains appropriate    Co-evaluation              AM-PAC PT "6 Clicks" Daily Activity  Outcome Measure  Difficulty turning over in bed (including adjusting bedclothes, sheets and blankets)?: A Little Difficulty moving from lying on back to sitting on the side of the bed? : A Little Difficulty sitting down on and standing up from a chair with arms (e.g.,  wheelchair, bedside commode, etc,.)?: A Little Help needed moving to and from a bed to chair (including a wheelchair)?: A Little Help needed walking in hospital room?: A Little Help needed climbing 3-5 steps with a railing? : Total 6 Click Score: 16    End of Session Equipment Utilized During Treatment: Gait belt;Left knee immobilizer Activity Tolerance: Patient tolerated treatment well Patient left: with call bell/phone within reach;with bed alarm set;in chair Nurse Communication: Mobility status (requesting pain medicine) PT Visit Diagnosis: Difficulty in walking, not elsewhere classified (R26.2);Muscle weakness (generalized) (M62.81);Other abnormalities of gait and mobility (R26.89);Pain Pain - Right/Left: Left Pain - part of body: Knee     Time: 1700-1749 PT Time Calculation (min) (ACUTE ONLY): 29 min  Charges:  $Gait Training: 8-22 mins $Therapeutic Activity: 8-22 mins                    G Codes:       Governor Rooks, PTA pager [phone]    Cristela Blue 02/17/2017, 2:44 PM

## 2017-02-18 ENCOUNTER — Telehealth: Payer: Self-pay | Admitting: Internal Medicine

## 2017-02-18 DIAGNOSIS — Z89612 Acquired absence of left leg above knee: Secondary | ICD-10-CM | POA: Diagnosis not present

## 2017-02-18 DIAGNOSIS — T8130XA Disruption of wound, unspecified, initial encounter: Secondary | ICD-10-CM | POA: Diagnosis not present

## 2017-02-18 DIAGNOSIS — T8454XD Infection and inflammatory reaction due to internal left knee prosthesis, subsequent encounter: Secondary | ICD-10-CM | POA: Diagnosis not present

## 2017-02-18 DIAGNOSIS — N184 Chronic kidney disease, stage 4 (severe): Secondary | ICD-10-CM | POA: Diagnosis not present

## 2017-02-18 DIAGNOSIS — E1122 Type 2 diabetes mellitus with diabetic chronic kidney disease: Secondary | ICD-10-CM | POA: Diagnosis not present

## 2017-02-18 DIAGNOSIS — I132 Hypertensive heart and chronic kidney disease with heart failure and with stage 5 chronic kidney disease, or end stage renal disease: Secondary | ICD-10-CM | POA: Diagnosis present

## 2017-02-18 DIAGNOSIS — I10 Essential (primary) hypertension: Secondary | ICD-10-CM | POA: Diagnosis not present

## 2017-02-18 DIAGNOSIS — F419 Anxiety disorder, unspecified: Secondary | ICD-10-CM | POA: Diagnosis present

## 2017-02-18 DIAGNOSIS — Y838 Other surgical procedures as the cause of abnormal reaction of the patient, or of later complication, without mention of misadventure at the time of the procedure: Secondary | ICD-10-CM | POA: Diagnosis present

## 2017-02-18 DIAGNOSIS — E1165 Type 2 diabetes mellitus with hyperglycemia: Secondary | ICD-10-CM | POA: Diagnosis not present

## 2017-02-18 DIAGNOSIS — E118 Type 2 diabetes mellitus with unspecified complications: Secondary | ICD-10-CM | POA: Diagnosis not present

## 2017-02-18 DIAGNOSIS — Z7982 Long term (current) use of aspirin: Secondary | ICD-10-CM | POA: Diagnosis not present

## 2017-02-18 DIAGNOSIS — E1129 Type 2 diabetes mellitus with other diabetic kidney complication: Secondary | ICD-10-CM | POA: Diagnosis not present

## 2017-02-18 DIAGNOSIS — B182 Chronic viral hepatitis C: Secondary | ICD-10-CM | POA: Diagnosis not present

## 2017-02-18 DIAGNOSIS — T8454XS Infection and inflammatory reaction due to internal left knee prosthesis, sequela: Secondary | ICD-10-CM | POA: Diagnosis not present

## 2017-02-18 DIAGNOSIS — N185 Chronic kidney disease, stage 5: Secondary | ICD-10-CM | POA: Diagnosis not present

## 2017-02-18 DIAGNOSIS — I5032 Chronic diastolic (congestive) heart failure: Secondary | ICD-10-CM | POA: Diagnosis not present

## 2017-02-18 DIAGNOSIS — I129 Hypertensive chronic kidney disease with stage 1 through stage 4 chronic kidney disease, or unspecified chronic kidney disease: Secondary | ICD-10-CM | POA: Diagnosis not present

## 2017-02-18 DIAGNOSIS — D631 Anemia in chronic kidney disease: Secondary | ICD-10-CM | POA: Diagnosis not present

## 2017-02-18 DIAGNOSIS — R262 Difficulty in walking, not elsewhere classified: Secondary | ICD-10-CM | POA: Diagnosis not present

## 2017-02-18 DIAGNOSIS — T8131XA Disruption of external operation (surgical) wound, not elsewhere classified, initial encounter: Secondary | ICD-10-CM | POA: Diagnosis present

## 2017-02-18 DIAGNOSIS — K219 Gastro-esophageal reflux disease without esophagitis: Secondary | ICD-10-CM | POA: Diagnosis present

## 2017-02-18 DIAGNOSIS — F329 Major depressive disorder, single episode, unspecified: Secondary | ICD-10-CM | POA: Diagnosis not present

## 2017-02-18 DIAGNOSIS — E1142 Type 2 diabetes mellitus with diabetic polyneuropathy: Secondary | ICD-10-CM | POA: Diagnosis present

## 2017-02-18 DIAGNOSIS — E1169 Type 2 diabetes mellitus with other specified complication: Secondary | ICD-10-CM | POA: Diagnosis not present

## 2017-02-18 DIAGNOSIS — N049 Nephrotic syndrome with unspecified morphologic changes: Secondary | ICD-10-CM | POA: Diagnosis not present

## 2017-02-18 DIAGNOSIS — N2581 Secondary hyperparathyroidism of renal origin: Secondary | ICD-10-CM | POA: Diagnosis not present

## 2017-02-18 DIAGNOSIS — F17210 Nicotine dependence, cigarettes, uncomplicated: Secondary | ICD-10-CM | POA: Diagnosis present

## 2017-02-18 DIAGNOSIS — D62 Acute posthemorrhagic anemia: Secondary | ICD-10-CM | POA: Diagnosis not present

## 2017-02-18 DIAGNOSIS — R488 Other symbolic dysfunctions: Secondary | ICD-10-CM | POA: Diagnosis not present

## 2017-02-18 DIAGNOSIS — Z794 Long term (current) use of insulin: Secondary | ICD-10-CM | POA: Diagnosis not present

## 2017-02-18 DIAGNOSIS — M25562 Pain in left knee: Secondary | ICD-10-CM | POA: Diagnosis not present

## 2017-02-18 DIAGNOSIS — M6281 Muscle weakness (generalized): Secondary | ICD-10-CM | POA: Diagnosis not present

## 2017-02-18 DIAGNOSIS — M009 Pyogenic arthritis, unspecified: Secondary | ICD-10-CM | POA: Diagnosis not present

## 2017-02-18 DIAGNOSIS — Z5189 Encounter for other specified aftercare: Secondary | ICD-10-CM | POA: Diagnosis not present

## 2017-02-18 DIAGNOSIS — D6489 Other specified anemias: Secondary | ICD-10-CM | POA: Diagnosis present

## 2017-02-18 DIAGNOSIS — N189 Chronic kidney disease, unspecified: Secondary | ICD-10-CM | POA: Diagnosis not present

## 2017-02-18 DIAGNOSIS — B1920 Unspecified viral hepatitis C without hepatic coma: Secondary | ICD-10-CM | POA: Diagnosis present

## 2017-02-18 DIAGNOSIS — D638 Anemia in other chronic diseases classified elsewhere: Secondary | ICD-10-CM | POA: Diagnosis not present

## 2017-02-18 DIAGNOSIS — Z79899 Other long term (current) drug therapy: Secondary | ICD-10-CM | POA: Diagnosis not present

## 2017-02-18 DIAGNOSIS — G8929 Other chronic pain: Secondary | ICD-10-CM | POA: Diagnosis not present

## 2017-02-18 DIAGNOSIS — Z96652 Presence of left artificial knee joint: Secondary | ICD-10-CM | POA: Diagnosis present

## 2017-02-18 LAB — BASIC METABOLIC PANEL
Anion gap: 11 (ref 5–15)
BUN: 52 mg/dL — ABNORMAL HIGH (ref 6–20)
CO2: 24 mmol/L (ref 22–32)
Calcium: 8.9 mg/dL (ref 8.9–10.3)
Chloride: 102 mmol/L (ref 101–111)
Creatinine, Ser: 3.46 mg/dL — ABNORMAL HIGH (ref 0.44–1.00)
GFR calc Af Amer: 15 mL/min — ABNORMAL LOW (ref >60)
GFR calc non Af Amer: 13 mL/min — ABNORMAL LOW (ref >60)
Glucose, Bld: 90 mg/dL (ref 65–99)
Potassium: 3.7 mmol/L (ref 3.5–5.1)
Sodium: 137 mmol/L (ref 135–145)

## 2017-02-18 LAB — GLUCOSE, CAPILLARY
Glucose-Capillary: 104 mg/dL — ABNORMAL HIGH (ref 65–99)
Glucose-Capillary: 139 mg/dL — ABNORMAL HIGH (ref 65–99)
Glucose-Capillary: 113 mg/dL — ABNORMAL HIGH (ref 65–99)

## 2017-02-18 LAB — CBC
HCT: 29.3 % — ABNORMAL LOW (ref 36.0–46.0)
Hemoglobin: 9.4 g/dL — ABNORMAL LOW (ref 12.0–15.0)
MCH: 27.8 pg (ref 26.0–34.0)
MCHC: 32.1 g/dL (ref 30.0–36.0)
MCV: 86.7 fL (ref 78.0–100.0)
Platelets: 431 10*3/uL — ABNORMAL HIGH (ref 150–400)
RBC: 3.38 MIL/uL — ABNORMAL LOW (ref 3.87–5.11)
RDW: 14.3 % (ref 11.5–15.5)
WBC: 11.3 10*3/uL — ABNORMAL HIGH (ref 4.0–10.5)

## 2017-02-18 MED ORDER — DAPTOMYCIN IV (FOR PTA / DISCHARGE USE ONLY): 420.0000 mg | INTRAVENOUS | 0 refills | Status: DC

## 2017-02-18 MED ORDER — OXYCODONE HCL 5 MG PO TABS: 5.0000 mg | ORAL_TABLET | Freq: Four times a day (QID) | ORAL | 0 refills | Status: AC | PRN

## 2017-02-18 MED ORDER — CIPROFLOXACIN HCL 250 MG PO TABS: 250.0000 mg | ORAL_TABLET | Freq: Every day | ORAL | 0 refills | Status: DC

## 2017-02-18 MED ORDER — ENOXAPARIN SODIUM 30 MG/0.3ML ~~LOC~~ SOLN: 30.0000 mg | Freq: Every day | SUBCUTANEOUS | 0 refills | Status: DC

## 2017-02-18 MED ORDER — NICOTINE 14 MG/24HR TD PT24: 14.0000 mg | MEDICATED_PATCH | Freq: Every day | TRANSDERMAL | 0 refills | Status: DC

## 2017-02-18 MED ORDER — HEPARIN SOD (PORK) LOCK FLUSH 100 UNIT/ML IV SOLN
250.0000 [IU] | INTRAVENOUS | Status: AC | PRN
  Administered 2017-02-18: 250 [IU]

## 2017-02-18 MED ORDER — FUROSEMIDE 80 MG PO TABS: 80.0000 mg | ORAL_TABLET | Freq: Two times a day (BID) | ORAL | 0 refills | Status: DC

## 2017-02-18 NOTE — Discharge Summary (Addendum)
Physician Discharge Summary  PARVEEN FREEHLING EFE:[id number] DOB: 01/15/1956 DOA: 02/05/2017  PCP: [name], MD  Admit date: 02/05/2017 Discharge date: 02/18/2017  Time spent: over 30 minutes   Recommendations for Outpatient Follow-up:  1. 1 week follow up for suture removal  2. Follow up 1 month with ID 3. Follow up with nephrology for thrombosed graft and worsening kidney function 4. Asymptomatic candiduria, continue to monitor 5. Follow up BMP and CBC for anemia and kidney function  6. Lovenox to continue an additional 14 days for VTE ppx (12 days postop today) 7. Continue to follow pain, d/c'd with oxycodone 5-10 mg q6 prn for 3 days 8.  D/c to SNF, [place]     Discharge Diagnoses:  Active Problems:   Peripheral neuropathy   Hepatitis C   Chronic kidney disease (CKD), stage IV (severe) ([name])   Chronic diastolic CHF (congestive heart failure) ([name])   Diabetic peripheral neuropathy ([name])   Depression   Anemia of chronic disease   Diabetes mellitus, type II ([name])   Infection of prosthetic left knee joint ([name])   AKI (acute kidney injury) ([place])   Hemarthrosis   Fever   Discharge Condition: stable  Diet recommendation: heart healthy  Filed Weights   02/16/17 0442 02/17/17 0500 02/18/17 0503  Weight: 70.7 kg (155 lb 13.8 oz) 71.4 kg (157 lb 6.4 oz) 71.6 kg (157 lb 12.8 oz)    History of present illness:  Brandy Houston 61 y.o.femalewith [place] Bash[place] medical history of diastolic heart failure, chronic kidney disease, diabetes, hypertension, septic arthritis, presented to the hospital from Dr. [name] office (ortho). Patient presented to his office on 02/03/2017 for left knee pain, status post aspiration. Fluid showed greater than 58,000 WBC. Patient states over the past several months, she's been having problems walking and her knee has been popping out of place. Currently patient denies any chest pain, shortness breath, abdominal pain, nausea vomiting, diarrhea or constipation,  dizziness or headache. She denies any recent trauma or falls. Denies any recent travel or ill contacts. Patient has been seeing Kaetlin Bullen hematologist for hemarthrosis and has had several bloody knee effusions. Per documentation, unlikely to be coagulopathy. Patient has not been on any antibiotics recently.  Interim history Underwent left total knee excision with placement of antibiotic spacer. Currently on daptomycin and ciprofloxacin. Nephrology consulted secondary to renal failure. ID on board and on the 31st there was report of tissue culture from her knee which was reporting anaerobic gram-negative rod.  ID suspects this to be Corrissa Martello contaminant as she's improving without therapy for thiss organism.  Plan for follow up in early October with ID and completion of daptomycin and ciprofloxacin for 6 week to complete 03/20/2017.  Hospital Course:   Left knee pain with edema -Concern for septic joint -Status post left knee arthroplasty in May 2017 by Dr. [name] -s/p aspiration yielding 120 mL's of bloody fluid (on 02/03/2017) -Fluid shows 58,000 WBC. Gram stain showed no organisms -s/p left total knee excision arthroplasty and placement of articulating methylmethacrylate antibiotic spacer; debridement of bone, muscle and skin 15x12cm; wound repair left knee 15cm - ID on board and assisting with case.  _0  8/24 tissue cx with bacteroides oralis, bcx 8/25 NGTD -Continue daptomycin and ciprofloxacin, will need IV daptomycin and cipro for 6 weeks per infectious disease recommendations.  Per ID, bacteroides oralis may be contaminant given improvement without specific therapy.  Will continue current therapy -Central line in place _1  f/u 1 week for suture  removal - lovenox for additional 2 weeks, discussed with Dr. [name] - touch down weight bearing to LLE in knee immobilizer (discussed this with [place] on 9/6 as not initially in d/c summary) - may take off knee immobilizer while in bed  Fever - Resolved.    Leukocytosis - normal 8/29 to 8.3, likely due to recent surgery vs possible knee infection - Currently on daptomycin and cipro - Blood cultures show no growth to date - CXR unremarkable for infection - UA TNTC WBC, large leukocyte, few bacteria (patient not complaining of any urinary symptoms) - Urine culture >100k yeast, but pt is asx continue to monitor - tissue cx as above, NG from 8/21 aspiration  Asymptomatic Candiduria: - continue to monitor  Chronic diastolic heart failure -Echocardiogram February 2016 showed Careem Yasui grade 2 diastolic dysfunction -Currently patient appears to be euvolemic and compensated -Monitor intake and output, daily weights - Patient is currently on Lasix  Acute kidney injury vs Chronic kidney disease, stage IV:  Creatinine worsened from 2.2 01/2016 to 3.7 12/2016.  Has been stable here around mid 3's.  Recently fluctuating.  - Serum creatinine stable at baseline. AKI resolved. - nephrology signed off _0  follow up thrombosed graft as outpatient - outpatient follow up  Diabetes mellitus, type II with neuropathy -Continue Lantus, insulin sliding scale was CBG monitoring -Hemoglobin A1c 6.2 -Continue gabapentin for neuropathy  Essential hypertension -Continue hydralazine -metoprolol and amlodipine discontinued  Depression -stable on Zoloft  Tobacco abuse -Smoking cessation discussed -Continue Nicotine patch  Anemia of chronic disease/ hemarthrosis/ Anemia due to blood loss -Hemoglobin was 7.1 on admission (baseline hemoglobin 9 over the past year) -Has had Trelon Plush history of hemarthrosis and was seeing oncology. Patient was receiving Aranesp injections every 30 days. -Patient denies any complaints of melena or hematochezia -Recently had anemia panel on 02/02/2017 which showed iron of 18, ferritin of 52 -Given 7uPRBC since admission -> stable H/H at discharge -Hemoglobin near 9.0 and stable  Hepatitis C -Per patient, stable -Continue  outpatient follow up  Procedures: Left total knee excision arthroplasty and placement of articulating methylmethacrylate antibiotic spacer; debridement of bone, muscle and skin 15x12cm; wound repair left knee 15cm - tunneled picc placement with IR  Consultations:  Nephrology, ID, ortho, IR  Discharge Exam: Vitals:   02/18/17 0503 02/18/17 1500  BP: (!) 156/68 (!) 143/63  Pulse: 73 67  Resp: 17 18  Temp: 98.4 F (36.9 C) 98.3 F (36.8 C)  SpO2: 97% 95%    General: NAD Cardiovascular: RRR, no mgr Respiratory: CTAB Abd: s/nt/nd Ext: L knee dressed, no LEE  Discharge Instructions   Discharge Instructions    Ambulatory referral to Infectious Disease    Complete by:  As directed    Follow up early October (finishes abx therapy 10/5)   Ambulatory referral to Nephrology    Complete by:  As directed    Follow up   Call MD for:  difficulty breathing, headache or visual disturbances    Complete by:  As directed    Call MD for:  extreme fatigue    Complete by:  As directed    Call MD for:  persistant dizziness or light-headedness    Complete by:  As directed    Call MD for:  persistant nausea and vomiting    Complete by:  As directed    Call MD for:  temperature >100.4    Complete by:  As directed    Diet - low sodium heart healthy  Complete by:  As directed    Discharge instructions    Complete by:  As directed    You were seen for an infection of your knee.  You had surgery to remove the hardware and had [place] antibiotic spacer placed.  You're currently on IV antibiotics (daptomycin) and ciprofloxacin until 03/20/2017.  Please follow up with orthopedics in 1 week.  Please follow up with infectious disease in October.  Follow up with nephrology given your kidney function and thrombosed graft.  Please follow up with your PCP to recheck labs.  You'll be on lovenox to help prevent blood clots for another 14 days.  We stopped your metolazone and have decreased your lasix dose.    Home infusion instructions [facility] May follow [place] Dosing Protocol; May administer Cathflo as needed to maintain patency of vascular access device.; Flushing of vascular access device: per [facility] Protocol: 0.9% NaCl pre/post medica...    Complete by:  As directed    Instructions:  May follow [place] Dosing Protocol   Instructions:  May administer Cathflo as needed to maintain patency of vascular access device.   Instructions:  Flushing of vascular access device: per [facility] Protocol: 0.9% NaCl pre/post medication administration and prn patency; Heparin 100 u/ml, 59m for implanted ports and Heparin 10u/ml, 557mfor all other central venous catheters.   Instructions:  May follow [facility] Anaphylaxis Protocol for First Dose Administration in the home: 0.9% NaCl at 25-50 ml/hr to maintain IV access for protocol meds. Epinephrine 0.3 ml IV/IM PRN and Benadryl 25-50 IV/IM PRN s/s of anaphylaxis.   Instructions:  [facility] Coordinator (RN) to assist per patient IV care needs in the home PRN.   Increase activity slowly    Complete by:  As directed      Current Discharge Medication List    START taking these medications   Details  ciprofloxacin (CIPRO) 250 MG tablet Take 1 tablet (250 mg total) by mouth daily. Qty: 35 tablet, Refills: 0    daptomycin (CUBICIN) IVPB Inject 420 mg into the vein every other day. Indication:  Left prosthetic knee infection Last Day of Therapy:  03/20/17 Labs - Once weekly:  CBC/D, BMP, and CPK Labs - Every other week:  ESR and CRP Qty: 17 Units, Refills: 0    enoxaparin (LOVENOX) 30 MG/0.3ML injection Inject 0.3 mLs (30 mg total) into the skin daily. Qty: 4.2 mL, Refills: 0    nicotine (NICODERM CQ - DOSED IN MG/24 HOURS) 14 mg/24hr patch Place 1 patch (14 mg total) onto the skin daily. Qty: 28 patch, Refills: 0    oxyCODONE (OXY IR/ROXICODONE) 5 MG immediate release tablet Take 1-2 tablets (5-10 mg total) by mouth every 6 (six) hours as  needed for moderate pain. Qty: 24 tablet, Refills: 0      CONTINUE these medications which have CHANGED   Details  furosemide (LASIX) 80 MG tablet Take 1 tablet (80 mg total) by mouth 2 (two) times daily. Qty: 60 tablet, Refills: 0      CONTINUE these medications which have NOT CHANGED   Details  acetaminophen (TYLENOL) 500 MG tablet Take 1,000 mg by mouth every 6 (six) hours as needed for mild pain.    amLODipine (NORVASC) 10 MG tablet Take 10 mg by mouth at bedtime.     aspirin EC 81 MG tablet Take 81 mg by mouth daily.    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    calcium carbonate (TUMS EX) 750 MG chewable tablet Chew 1 tablet by mouth 3 (three) times daily as needed for heartburn.     Darbepoetin Alfa (ARANESP) 100 MCG/0.5ML SOSY injection Inject 100 mcg into the skin every 30 (thirty) days.    diphenhydrAMINE (BENADRYL) 25 mg capsule Take 1 capsule (25 mg total) by mouth every 6 (six) hours as needed for itching. Qty: 30 capsule, Refills: 0    ferrous sulfate 325 (65 FE) MG tablet Take 325 mg by mouth 2 (two) times daily with Kasidi Shanker meal.    gabapentin (NEURONTIN) 100 MG capsule Take 1 capsule (100 mg total) by mouth 2 (two) times daily. 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     insulin glargine (LANTUS) 100 UNIT/ML injection Inject 15 Units into the  skin daily before breakfast.     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     Multiple Vitamin (MULTIVITAMIN WITH MINERALS) TABS tablet Take 1 tablet by mouth daily.    omeprazole (PRILOSEC) 20 MG capsule Take 20 mg by mouth daily before breakfast.     Potassium Chloride ER 20 MEQ TBCR Take 20 mEq by mouth 2 (two) times daily.     sertraline (ZOLOFT) 100 MG tablet Take 1 tablet (100 mg total) by mouth daily. Qty: 30 tablet, Refills: 6   Associated Diagnoses: Depression    traMADol (ULTRAM) 50 MG tablet Take 1-2 tablets (50-100 mg total) by mouth every 6 (six) hours as needed. Qty: 60 tablet, Refills: 0    Diclofenac Sodium 2 % SOLN Place 2 g onto the skin 2 (two) times daily as needed. Qty: 1 Bottle, Refills: 5    sennosides-docusate sodium (SENOKOT-S) 8.6-50 MG tablet Take 2 tablets by mouth at bedtime.      STOP taking these medications     metolazone (ZAROXOLYN) 2.5 MG tablet        Allergies  Allergen Reactions  . Compazine [Prochlorperazine] Shortness Of Breath and Swelling    TONGUE SWELLS  . Shellfish-Derived Products Anaphylaxis  . Iodinated Diagnostic Agents Hives and Rash  . Omnipaque [Iohexol] Hives    Contact information for follow-up providers    [name], MD Follow up.   Specialty:  Family Medicine Contact information: [place] [address] [phone]        Leandrew Koyanagi, MD. Schedule an appointment as soon as possible for Adwoa Axe visit in 1 week(s).   Specialty:  Orthopedic Surgery Why:  Follow  up in 1 week Contact information: [place] [address] [phone]        Jamal Maes, MD Follow up.   Specialty:  Nephrology Contact information: [place] [address] [phone]        Michel Bickers, MD Follow up in 1 month(s).   Specialty:  Infectious Diseases Contact information: [address] [place] [address] [phone]            Contact information for after-discharge care    Destination    [facility] Follow up.   Specialty:  Skilled Nursing Facility Contact information: [place] [phone]                   The results of significant diagnostics from this hospitalization (including imaging, microbiology, ancillary and laboratory) are listed below for reference.    Significant Diagnostic Studies: Dg Chest 2 View  Result Date: 02/07/2017 CLINICAL DATA:  Cough EXAM: CHEST  2 VIEW COMPARISON:  09/21/2014 FINDINGS: Lungs are clear.  No pleural effusion or pneumothorax. Cardiomegaly. Mild degenerative changes of the visualized thoracolumbar spine. IMPRESSION: No evidence of acute cardiopulmonary disease. Electronically Signed   By: Julian Hy M.D.   On: 02/07/2017 15:17   Ir Fluoro Guide Cv Line Right  Result Date: 02/10/2017 INDICATION: KNEE INFECTION, ACCESS FOR LONG-TERM IV ANTIBIOTICS, ELEVATED CREATININE EXAM: ULTRASOUND AND FLUOROSCOPIC GUIDED RIGHT IJ SINGLE-LUMEN TUNNELED PICC LINE INSERTION MEDICATIONS: 1% lidocaine local CONTRAST:  None FLUOROSCOPY TIME:  Twelve seconds (1.0 mGy) COMPLICATIONS: None immediate. TECHNIQUE: The procedure, risks, benefits, and alternatives were explained to the patient and informed written consent was obtained. Cora Brierley timeout was performed prior to the initiation of the procedure. The right neck and chest were prepped with chlorhexidine in Coreon Simkins sterile fashion, and Kamdin Follett sterile drape was applied  covering the operative field. Maximum barrier sterile technique with sterile gowns and gloves were used for the procedure. Murdis Flitton timeout was performed prior to the initiation of the procedure. Local anesthesia was provided with 1% lidocaine. Under direct ultrasound guidance, the right internal jugular vein was accessed with Shiann Kam micropuncture kit after the overlying soft tissues were anesthetized with 1% lidocaine. An ultrasound image was saved for documentation purposes. Sherelle Castelli guidewire was advanced to the level of the superior caval-atrial junction for measurement purposes and the PICC line was cut to length. In the right chest, Melvin Whiteford subcutaneous tunnel was created under sterile conditions and local anesthesia. The single-lumen power cuffed PICC line was tunneled subcutaneously to the venotomy site. Amaris Delafuente peel-away sheath was placed and Alauna Hayden 16 cm, 5 [place], single lumen was inserted to level of the superior caval-atrial junction. Derrell Milanes post procedure spot fluoroscopic was obtained. The catheter easily aspirated and flushed and was sutured in place. Kalynne Womac dressing was placed. The patient tolerated the procedure well without immediate post procedural complication. FINDINGS: After catheter placement, the tip lies within the superior cavoatrial junction. The catheter aspirates and flushes normally and is ready for immediate use. IMPRESSION: Successful ultrasound and fluoroscopic guided placement of Kavita Bartl right IJ vein approach, 16 cm, 5 [place], single lumen tunneled PICC with tip at the superior caval-atrial junction. The PICC line is ready for immediate use. Electronically Signed   By: Jerilynn Mages.  Shick M.D.   On: 02/10/2017 14:13   Ir US Guide Vasc Access Right  Result Date: 02/10/2017 INDICATION: KNEE INFECTION, ACCESS FOR LONG-TERM IV ANTIBIOTICS, ELEVATED CREATININE EXAM: ULTRASOUND AND FLUOROSCOPIC GUIDED RIGHT IJ SINGLE-LUMEN  TUNNELED PICC LINE INSERTION MEDICATIONS: 1% lidocaine local CONTRAST:  None FLUOROSCOPY TIME:  Twelve seconds (1.0 mGy)  COMPLICATIONS: None immediate. TECHNIQUE: The procedure, risks, benefits, and alternatives were explained to the patient and informed written consent was obtained. Dillen Belmontes timeout was performed prior to the initiation of the procedure. The right neck and chest were prepped with chlorhexidine in Raye Slyter sterile fashion, and Latravious Levitt sterile drape was applied covering the operative field. Maximum barrier sterile technique with sterile gowns and gloves were used for the procedure. Eulah Walkup timeout was performed prior to the initiation of the procedure. Local anesthesia was provided with 1% lidocaine. Under direct ultrasound guidance, the right internal jugular vein was accessed with Kirke Breach micropuncture kit after the overlying soft tissues were anesthetized with 1% lidocaine. An ultrasound image was saved for documentation purposes. Thane Age guidewire was advanced to the level of the superior caval-atrial junction for measurement purposes and the PICC line was cut to length. In the right chest, Krystale Rinkenberger subcutaneous tunnel was created under sterile conditions and local anesthesia. The single-lumen power cuffed PICC line was tunneled subcutaneously to the venotomy site. Bennet Kujawa peel-away sheath was placed and Gianni Mihalik 16 cm, 5 [place], single lumen was inserted to level of the superior caval-atrial junction. Lillian Ballester post procedure spot fluoroscopic was obtained. The catheter easily aspirated and flushed and was sutured in place. Praise Dolecki dressing was placed. The patient tolerated the procedure well without immediate post procedural complication. FINDINGS: After catheter placement, the tip lies within the superior cavoatrial junction. The catheter aspirates and flushes normally and is ready for immediate use. IMPRESSION: Successful ultrasound and fluoroscopic guided placement of Jonica Bickhart right IJ vein approach, 16 cm, 5 [place], single lumen tunneled PICC with tip at the superior caval-atrial junction. The PICC line is ready for immediate use. Electronically Signed   By: Jerilynn Mages.  Shick M.D.   On:  02/10/2017 14:13   Dg Chest Port 1 View  Result Date: 02/15/2017 CLINICAL DATA:  Evaluate PICC line. EXAM: PORTABLE CHEST 1 VIEW COMPARISON:  02/07/2017 and prior exams FINDINGS: Cristela Stalder right IJ PICC line is present with tip overlying the superior cavoatrial junction. Cardiomegaly is noted. There is no evidence of focal airspace disease, pulmonary edema, suspicious pulmonary nodule/mass, pleural effusion, or pneumothorax. No acute bony abnormalities are identified. IMPRESSION: Right PICC line with tip overlying the superior cavoatrial junction. Cardiomegaly without acute cardiopulmonary disease. Electronically Signed   By: Margarette Canada M.D.   On: 02/15/2017 17:18   Dg Knee Left Port  Result Date: 02/06/2017 CLINICAL DATA:  Total knee replacement status EXAM: PORTABLE LEFT KNEE - 1-2 VIEW COMPARISON:  02/08/2016 FINDINGS: Interval removal of left knee arthroplasty with placement of an antibiotic spacer and antibiotic beads. Associated soft tissue swelling and suprapatellar knee joint effusion. Vascular calcifications. IMPRESSION: Interval removal of left knee arthroplasty with placement of an antibiotic spacer and antibiotic beads. Electronically Signed   By: Julian Hy M.D.   On: 02/06/2017 21:50    Microbiology: No results found for this or any previous visit (from the past 240 hour(s)).   Labs: Basic Metabolic Panel:  Recent Labs Lab 02/12/17 0340 02/16/17 0503 02/18/17 1310  NA 137 138 137  K 3.9 3.6 3.7  CL 107 104 102  CO2 _0 GLUCOSE 96 98 90  BUN 59* 60* 52*  CREATININE 3.28* 3.60* 3.46*  CALCIUM 8.7* 8.7* 8.9  PHOS 5.0*  --   --    Liver Function Tests:  Recent Labs Lab 02/12/17 0340  ALBUMIN 2.0*  No results for input(s): LIPASE, AMYLASE in the last 168 hours. No results for input(s): AMMONIA in the last 168 hours. CBC:  Recent Labs Lab 02/18/17 1310  WBC 11.3*  HGB 9.4*  HCT 29.3*  MCV 86.7  PLT 431*   Cardiac Enzymes:  Recent Labs Lab  02/16/17 0503  CKTOTAL 38   BNP: BNP (last 3 results) No results for input(s): BNP in the last 8760 hours.  ProBNP (last 3 results) No results for input(s): PROBNP in the last 8760 hours.  CBG:  Recent Labs Lab 02/17/17 1132 02/17/17 1612 02/17/17 2129 02/18/17 0756 02/18/17 1227  GLUCAP 136* 139* 142* 104* 139*       Signed:  Burech Mcfarland Melven Sartorius MD.  [facility] 02/18/2017, 5:11 PM

## 2017-02-18 NOTE — Telephone Encounter (Signed)
Mr. Oka underwent resection arthroplasty and spacer placement of her left knee on 02/06/2017. I elected to treat her with daptomycin and ciprofloxacin for presumed culture negative prosthetic knee infection. However 1 culture from her left femoral canal grew "RARE BACTEROIDES ORALIS". I cannot be absolute certainty but I strongly suspect that this is an insignificant contaminant. She appears to be getting better without any specific therapy for this organism. Also, Bacteroides is a very unlikely cause of prosthetic joint infection. I will not broaden her antibiotic therapy. I will continue daptomycin and ciprofloxacin for 6 weeks total the end date is 03/20/2017. I will see her back in my clinic in early October.

## 2017-02-18 NOTE — Progress Notes (Signed)
Discharge instructions reviewed with pt and prescriptions given to take to [facility].  Pt verbalized understanding and questions answered.  Discharge instructions, prescriptions and discharge summary given to son to take to [facility].  Pt discharged in stable condition via wheelchair with son to [facility]. Report called to nurse at [facility].  Brandy Houston

## 2017-02-18 NOTE — Progress Notes (Signed)
Patient will discharge to [facility] Anticipated discharge date: 9/5 Family notified: [place] by Raquel Sarna- plans to arrive around 4:30pm  CSW signing off.  Jorge Ny, LCSW Clinical Social Worker [phone]

## 2017-02-18 NOTE — Progress Notes (Signed)
   Subjective:  Patient reports pain as mild.  stable  Objective:   VITALS:   Vitals:   02/17/17 0603 02/17/17 1516 02/17/17 2131 02/18/17 0503  BP: (!) 147/62 (!) 137/56 (!) 149/65 (!) 156/68  Pulse: 67 63 73 73  Resp: 16 18 18 17   Temp: 98.6 F (37 C) 99.3 F (37.4 C) 98.6 F (37 C) 98.4 F (36.9 C)  TempSrc: Oral Oral Oral Oral  SpO2: 95% 99% 100% 97%  Weight:    157 lb 12.8 oz (71.6 kg)  Height:        Incision c/d/i   Lab Results  Component Value Date   WBC 8.3 02/11/2017   HGB 8.9 (L) 02/11/2017   HCT 27.1 (L) 02/11/2017   MCV 86.9 02/11/2017   PLT 334 02/11/2017     Assessment/Plan:  12 Days Post-Op   - patient is stable from ortho stand point - cultures are negative, appreciate ID recs - TDWB LLE in KI - may take off KI while in bed - follow up in office in 1 week for suture removal  Eduard Roux 02/18/2017, 7:39 AM [phone]

## 2017-02-19 ENCOUNTER — Ambulatory Visit (INDEPENDENT_AMBULATORY_CARE_PROVIDER_SITE_OTHER): Payer: Medicaid Other | Admitting: Orthopaedic Surgery

## 2017-02-26 ENCOUNTER — Ambulatory Visit (INDEPENDENT_AMBULATORY_CARE_PROVIDER_SITE_OTHER): Payer: Medicare Other | Admitting: Orthopaedic Surgery

## 2017-02-26 DIAGNOSIS — Z96652 Presence of left artificial knee joint: Secondary | ICD-10-CM

## 2017-02-26 DIAGNOSIS — T8454XD Infection and inflammatory reaction due to internal left knee prosthesis, subsequent encounter: Secondary | ICD-10-CM

## 2017-02-26 NOTE — Progress Notes (Signed)
Brandy Houston is 2 weeks status post left total knee resection arthroplasty and placement antibiotic spacer. She is currently at a nursing home. Her incision is clean dry and intact without any signs of infection. She still has a persistent knee effusion. Her pain is well-controlled. She is currently on IV daptomycin.  We will keep the sutures in for one more week. She continues to have the knee effusion which is perplexing to me. I will see her back in a week for suture removal.

## 2017-03-02 ENCOUNTER — Encounter (HOSPITAL_COMMUNITY)
Admission: RE | Admit: 2017-03-02 | Discharge: 2017-03-02 | Disposition: A | Discharge status: Home / Self Care | Payer: No Typology Code available for payment source | Source: Ambulatory Visit | Attending: Nephrology | Admitting: Nephrology

## 2017-03-02 DIAGNOSIS — N184 Chronic kidney disease, stage 4 (severe): Secondary | ICD-10-CM | POA: Diagnosis not present

## 2017-03-02 DIAGNOSIS — D631 Anemia in chronic kidney disease: Secondary | ICD-10-CM | POA: Insufficient documentation

## 2017-03-02 LAB — IRON AND TIBC
Iron: 20 ug/dL — ABNORMAL LOW (ref 28–170)
Saturation Ratios: 9 % — ABNORMAL LOW (ref 10.4–31.8)
TIBC: 231 ug/dL — ABNORMAL LOW (ref 250–450)
UIBC: 211 ug/dL

## 2017-03-02 LAB — POCT HEMOGLOBIN-HEMACUE: Hemoglobin: 8.9 g/dL — ABNORMAL LOW (ref 12.0–15.0)

## 2017-03-02 LAB — FERRITIN: Ferritin: 296 ng/mL (ref 11–307)

## 2017-03-02 MED ORDER — DARBEPOETIN ALFA 100 MCG/0.5ML IJ SOSY
PREFILLED_SYRINGE | INTRAMUSCULAR | Status: AC
Start: 2017-03-02 — End: 2017-03-02
  Administered 2017-03-02: 100 ug via SUBCUTANEOUS
  Filled 2017-03-02: qty 0.5

## 2017-03-02 MED ORDER — DARBEPOETIN ALFA 100 MCG/0.5ML IJ SOSY
100.0000 ug | PREFILLED_SYRINGE | INTRAMUSCULAR | Status: DC
  Administered 2017-03-02: 100 ug via SUBCUTANEOUS

## 2017-03-05 ENCOUNTER — Ambulatory Visit (INDEPENDENT_AMBULATORY_CARE_PROVIDER_SITE_OTHER): Payer: Medicare Other | Admitting: Orthopaedic Surgery

## 2017-03-05 ENCOUNTER — Ambulatory Visit (INDEPENDENT_AMBULATORY_CARE_PROVIDER_SITE_OTHER): Payer: Medicare Other

## 2017-03-05 DIAGNOSIS — M25562 Pain in left knee: Secondary | ICD-10-CM

## 2017-03-05 DIAGNOSIS — G8929 Other chronic pain: Secondary | ICD-10-CM

## 2017-03-05 DIAGNOSIS — Z96652 Presence of left artificial knee joint: Secondary | ICD-10-CM

## 2017-03-05 NOTE — Progress Notes (Signed)
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 She follows up today for suture removal. She is set to be discharged from her skilled nursing facility soon. She remains on IV daptomycin through her central line. Her pain is well-controlled.  Her surgical incision is healed. She still has a persistent large joint effusion. There is no active drainage. Neurovascularly intact distally.  X-rays show stable antibiotic spacer and beads  Patient will continue with touchdown weightbearing to the left lower extremity in knee immobilizer. 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 She has upcoming appointments with her nephrologist and infectious disease doctors. I will see her back in 4 weeks.

## 2017-03-09 ENCOUNTER — Other Ambulatory Visit (HOSPITAL_COMMUNITY): Payer: Self-pay | Admitting: *Deleted

## 2017-03-09 DIAGNOSIS — N184 Chronic kidney disease, stage 4 (severe): Secondary | ICD-10-CM | POA: Diagnosis not present

## 2017-03-09 DIAGNOSIS — D631 Anemia in chronic kidney disease: Secondary | ICD-10-CM | POA: Diagnosis not present

## 2017-03-09 DIAGNOSIS — N2581 Secondary hyperparathyroidism of renal origin: Secondary | ICD-10-CM | POA: Diagnosis not present

## 2017-03-09 DIAGNOSIS — I129 Hypertensive chronic kidney disease with stage 1 through stage 4 chronic kidney disease, or unspecified chronic kidney disease: Secondary | ICD-10-CM | POA: Diagnosis not present

## 2017-03-09 DIAGNOSIS — Z96652 Presence of left artificial knee joint: Secondary | ICD-10-CM | POA: Diagnosis not present

## 2017-03-09 DIAGNOSIS — N049 Nephrotic syndrome with unspecified morphologic changes: Secondary | ICD-10-CM | POA: Diagnosis not present

## 2017-03-09 DIAGNOSIS — E1129 Type 2 diabetes mellitus with other diabetic kidney complication: Secondary | ICD-10-CM | POA: Diagnosis not present

## 2017-03-10 ENCOUNTER — Inpatient Hospital Stay: Admit: 2017-03-10 | Payer: Self-pay | Admitting: Orthopaedic Surgery

## 2017-03-10 ENCOUNTER — Other Ambulatory Visit: Payer: Self-pay | Admitting: Family Medicine

## 2017-03-10 ENCOUNTER — Inpatient Hospital Stay (HOSPITAL_COMMUNITY): Admission: RE | Admit: 2017-03-10 | Payer: Medicare Other | Source: Ambulatory Visit

## 2017-03-10 ENCOUNTER — Encounter (HOSPITAL_COMMUNITY): Payer: Self-pay | Admitting: General Practice

## 2017-03-10 ENCOUNTER — Inpatient Hospital Stay (HOSPITAL_COMMUNITY)
Admission: AD | Admit: 2017-03-10 | Discharge: 2017-03-26 | DRG: 908 | Disposition: A | Discharge status: Skilled Nursing Facility | Payer: Medicare Other | Source: Ambulatory Visit | Attending: Internal Medicine | Admitting: Internal Medicine

## 2017-03-10 ENCOUNTER — Encounter (INDEPENDENT_AMBULATORY_CARE_PROVIDER_SITE_OTHER): Payer: Self-pay | Admitting: Orthopaedic Surgery

## 2017-03-10 ENCOUNTER — Ambulatory Visit (INDEPENDENT_AMBULATORY_CARE_PROVIDER_SITE_OTHER): Payer: Medicare Other | Admitting: Orthopaedic Surgery

## 2017-03-10 DIAGNOSIS — N184 Chronic kidney disease, stage 4 (severe): Secondary | ICD-10-CM | POA: Diagnosis not present

## 2017-03-10 DIAGNOSIS — Z4781 Encounter for orthopedic aftercare following surgical amputation: Secondary | ICD-10-CM | POA: Diagnosis not present

## 2017-03-10 DIAGNOSIS — T8454XD Infection and inflammatory reaction due to internal left knee prosthesis, subsequent encounter: Secondary | ICD-10-CM

## 2017-03-10 DIAGNOSIS — N189 Chronic kidney disease, unspecified: Secondary | ICD-10-CM | POA: Diagnosis present

## 2017-03-10 DIAGNOSIS — D72829 Elevated white blood cell count, unspecified: Secondary | ICD-10-CM

## 2017-03-10 DIAGNOSIS — I70249 Atherosclerosis of native arteries of left leg with ulceration of unspecified site: Secondary | ICD-10-CM | POA: Diagnosis not present

## 2017-03-10 DIAGNOSIS — E1165 Type 2 diabetes mellitus with hyperglycemia: Secondary | ICD-10-CM | POA: Diagnosis not present

## 2017-03-10 DIAGNOSIS — M25562 Pain in left knee: Secondary | ICD-10-CM | POA: Diagnosis not present

## 2017-03-10 DIAGNOSIS — N186 End stage renal disease: Secondary | ICD-10-CM | POA: Diagnosis not present

## 2017-03-10 DIAGNOSIS — T8130XA Disruption of wound, unspecified, initial encounter: Secondary | ICD-10-CM | POA: Diagnosis not present

## 2017-03-10 DIAGNOSIS — Z23 Encounter for immunization: Secondary | ICD-10-CM | POA: Diagnosis not present

## 2017-03-10 DIAGNOSIS — I130 Hypertensive heart and chronic kidney disease with heart failure and stage 1 through stage 4 chronic kidney disease, or unspecified chronic kidney disease: Secondary | ICD-10-CM | POA: Diagnosis not present

## 2017-03-10 DIAGNOSIS — Z96652 Presence of left artificial knee joint: Secondary | ICD-10-CM

## 2017-03-10 DIAGNOSIS — D6489 Other specified anemias: Secondary | ICD-10-CM | POA: Diagnosis present

## 2017-03-10 DIAGNOSIS — E1142 Type 2 diabetes mellitus with diabetic polyneuropathy: Secondary | ICD-10-CM | POA: Diagnosis present

## 2017-03-10 DIAGNOSIS — D631 Anemia in chronic kidney disease: Secondary | ICD-10-CM | POA: Diagnosis not present

## 2017-03-10 DIAGNOSIS — Z89612 Acquired absence of left leg above knee: Secondary | ICD-10-CM | POA: Diagnosis not present

## 2017-03-10 DIAGNOSIS — E118 Type 2 diabetes mellitus with unspecified complications: Secondary | ICD-10-CM | POA: Diagnosis not present

## 2017-03-10 DIAGNOSIS — K219 Gastro-esophageal reflux disease without esophagitis: Secondary | ICD-10-CM | POA: Diagnosis present

## 2017-03-10 DIAGNOSIS — Z794 Long term (current) use of insulin: Secondary | ICD-10-CM

## 2017-03-10 DIAGNOSIS — F419 Anxiety disorder, unspecified: Secondary | ICD-10-CM | POA: Diagnosis present

## 2017-03-10 DIAGNOSIS — B1920 Unspecified viral hepatitis C without hepatic coma: Secondary | ICD-10-CM | POA: Diagnosis present

## 2017-03-10 DIAGNOSIS — D638 Anemia in other chronic diseases classified elsewhere: Secondary | ICD-10-CM | POA: Diagnosis not present

## 2017-03-10 DIAGNOSIS — F17210 Nicotine dependence, cigarettes, uncomplicated: Secondary | ICD-10-CM | POA: Diagnosis present

## 2017-03-10 DIAGNOSIS — S78119A Complete traumatic amputation at level between unspecified hip and knee, initial encounter: Secondary | ICD-10-CM

## 2017-03-10 DIAGNOSIS — I5032 Chronic diastolic (congestive) heart failure: Secondary | ICD-10-CM | POA: Diagnosis not present

## 2017-03-10 DIAGNOSIS — L97929 Non-pressure chronic ulcer of unspecified part of left lower leg with unspecified severity: Secondary | ICD-10-CM | POA: Diagnosis not present

## 2017-03-10 DIAGNOSIS — N185 Chronic kidney disease, stage 5: Secondary | ICD-10-CM | POA: Diagnosis present

## 2017-03-10 DIAGNOSIS — G8918 Other acute postprocedural pain: Secondary | ICD-10-CM | POA: Diagnosis not present

## 2017-03-10 DIAGNOSIS — Y838 Other surgical procedures as the cause of abnormal reaction of the patient, or of later complication, without mention of misadventure at the time of the procedure: Secondary | ICD-10-CM | POA: Diagnosis present

## 2017-03-10 DIAGNOSIS — R52 Pain, unspecified: Secondary | ICD-10-CM | POA: Diagnosis not present

## 2017-03-10 DIAGNOSIS — E1122 Type 2 diabetes mellitus with diabetic chronic kidney disease: Secondary | ICD-10-CM | POA: Diagnosis present

## 2017-03-10 DIAGNOSIS — N6320 Unspecified lump in the left breast, unspecified quadrant: Secondary | ICD-10-CM

## 2017-03-10 DIAGNOSIS — R109 Unspecified abdominal pain: Secondary | ICD-10-CM | POA: Diagnosis not present

## 2017-03-10 DIAGNOSIS — E669 Obesity, unspecified: Secondary | ICD-10-CM | POA: Diagnosis not present

## 2017-03-10 DIAGNOSIS — T8131XA Disruption of external operation (surgical) wound, not elsewhere classified, initial encounter: Secondary | ICD-10-CM | POA: Diagnosis present

## 2017-03-10 DIAGNOSIS — M4856XA Collapsed vertebra, not elsewhere classified, lumbar region, initial encounter for fracture: Secondary | ICD-10-CM | POA: Diagnosis not present

## 2017-03-10 DIAGNOSIS — I10 Essential (primary) hypertension: Secondary | ICD-10-CM | POA: Diagnosis not present

## 2017-03-10 DIAGNOSIS — D62 Acute posthemorrhagic anemia: Secondary | ICD-10-CM | POA: Diagnosis not present

## 2017-03-10 DIAGNOSIS — I132 Hypertensive heart and chronic kidney disease with heart failure and with stage 5 chronic kidney disease, or end stage renal disease: Secondary | ICD-10-CM | POA: Diagnosis present

## 2017-03-10 DIAGNOSIS — T8454XA Infection and inflammatory reaction due to internal left knee prosthesis, initial encounter: Secondary | ICD-10-CM | POA: Diagnosis not present

## 2017-03-10 DIAGNOSIS — Z79899 Other long term (current) drug therapy: Secondary | ICD-10-CM | POA: Diagnosis not present

## 2017-03-10 DIAGNOSIS — F329 Major depressive disorder, single episode, unspecified: Secondary | ICD-10-CM | POA: Diagnosis not present

## 2017-03-10 DIAGNOSIS — Z7982 Long term (current) use of aspirin: Secondary | ICD-10-CM | POA: Diagnosis not present

## 2017-03-10 DIAGNOSIS — IMO0002 Reserved for concepts with insufficient information to code with codable children: Secondary | ICD-10-CM

## 2017-03-10 DIAGNOSIS — E1169 Type 2 diabetes mellitus with other specified complication: Secondary | ICD-10-CM | POA: Diagnosis not present

## 2017-03-10 DIAGNOSIS — M009 Pyogenic arthritis, unspecified: Secondary | ICD-10-CM | POA: Diagnosis not present

## 2017-03-10 LAB — COMPREHENSIVE METABOLIC PANEL
ALT: 40 U/L (ref 14–54)
AST: 37 U/L (ref 15–41)
Albumin: 2.9 g/dL — ABNORMAL LOW (ref 3.5–5.0)
Alkaline Phosphatase: 169 U/L — ABNORMAL HIGH (ref 38–126)
Anion gap: 6 (ref 5–15)
BUN: 48 mg/dL — ABNORMAL HIGH (ref 6–20)
CO2: 21 mmol/L — ABNORMAL LOW (ref 22–32)
Calcium: 8.8 mg/dL — ABNORMAL LOW (ref 8.9–10.3)
Chloride: 107 mmol/L (ref 101–111)
Creatinine, Ser: 3.48 mg/dL — ABNORMAL HIGH (ref 0.44–1.00)
GFR calc Af Amer: 15 mL/min — ABNORMAL LOW (ref >60)
GFR calc non Af Amer: 13 mL/min — ABNORMAL LOW (ref >60)
Glucose, Bld: 96 mg/dL (ref 65–99)
Potassium: 4.4 mmol/L (ref 3.5–5.1)
Sodium: 134 mmol/L — ABNORMAL LOW (ref 135–145)
Total Bilirubin: 0.3 mg/dL (ref 0.3–1.2)
Total Protein: 8.1 g/dL (ref 6.5–8.1)

## 2017-03-10 LAB — CBC WITH DIFFERENTIAL/PLATELET
Basophils Absolute: 0 10*3/uL (ref 0.0–0.1)
Basophils Relative: 0 %
Eosinophils Absolute: 0.1 10*3/uL (ref 0.0–0.7)
Eosinophils Relative: 2 %
HCT: 27.5 % — ABNORMAL LOW (ref 36.0–46.0)
Hemoglobin: 8.5 g/dL — ABNORMAL LOW (ref 12.0–15.0)
Lymphocytes Relative: 11 %
Lymphs Abs: 0.9 10*3/uL (ref 0.7–4.0)
MCH: 26.8 pg (ref 26.0–34.0)
MCHC: 30.9 g/dL (ref 30.0–36.0)
MCV: 86.8 fL (ref 78.0–100.0)
Monocytes Absolute: 0.7 10*3/uL (ref 0.1–1.0)
Monocytes Relative: 8 %
Neutro Abs: 6.3 10*3/uL (ref 1.7–7.7)
Neutrophils Relative %: 79 %
Platelets: 301 10*3/uL (ref 150–400)
RBC: 3.17 MIL/uL — ABNORMAL LOW (ref 3.87–5.11)
RDW: 16.2 % — ABNORMAL HIGH (ref 11.5–15.5)
WBC: 8.1 10*3/uL (ref 4.0–10.5)

## 2017-03-10 MED ORDER — INSULIN ASPART 100 UNIT/ML ~~LOC~~ SOLN
0.0000 [IU] | Freq: Three times a day (TID) | SUBCUTANEOUS | Status: DC
  Administered 2017-03-11 (×2): 1 [IU] via SUBCUTANEOUS
  Administered 2017-03-11: 2 [IU] via SUBCUTANEOUS
  Administered 2017-03-13 – 2017-03-14 (×4): 1 [IU] via SUBCUTANEOUS
  Administered 2017-03-16: 2 [IU] via SUBCUTANEOUS
  Administered 2017-03-16: 1 [IU] via SUBCUTANEOUS
  Administered 2017-03-17: 2 [IU] via SUBCUTANEOUS
  Administered 2017-03-17: 1 [IU] via SUBCUTANEOUS
  Administered 2017-03-18: 2 [IU] via SUBCUTANEOUS
  Administered 2017-03-18 – 2017-03-21 (×5): 1 [IU] via SUBCUTANEOUS
  Administered 2017-03-21: 3 [IU] via SUBCUTANEOUS
  Administered 2017-03-22 – 2017-03-25 (×6): 1 [IU] via SUBCUTANEOUS

## 2017-03-10 MED ORDER — FUROSEMIDE 40 MG PO TABS
80.0000 mg | ORAL_TABLET | Freq: Two times a day (BID) | ORAL | Status: DC
  Administered 2017-03-10 – 2017-03-26 (×31): 80 mg via ORAL
  Filled 2017-03-10 (×32): qty 2

## 2017-03-10 MED ORDER — NICOTINE 14 MG/24HR TD PT24
14.0000 mg | MEDICATED_PATCH | Freq: Every day | TRANSDERMAL | Status: DC
  Administered 2017-03-10 – 2017-03-26 (×17): 14 mg via TRANSDERMAL
  Filled 2017-03-10 (×17): qty 1

## 2017-03-10 MED ORDER — SODIUM CHLORIDE 0.9 % IV SOLN
420.0000 mg | INTRAVENOUS | Status: DC
  Administered 2017-03-11 – 2017-03-12 (×2): 420 mg via INTRAVENOUS
  Filled 2017-03-10 (×2): qty 8.4

## 2017-03-10 MED ORDER — SODIUM CHLORIDE 0.9% FLUSH
3.0000 mL | INTRAVENOUS | Status: DC | PRN
  Administered 2017-03-15 – 2017-03-22 (×2): 3 mL via INTRAVENOUS
  Filled 2017-03-10 (×2): qty 3

## 2017-03-10 MED ORDER — ONDANSETRON HCL 4 MG PO TABS
4.0000 mg | ORAL_TABLET | Freq: Four times a day (QID) | ORAL | Status: DC | PRN
  Administered 2017-03-11: 4 mg via ORAL
  Filled 2017-03-10: qty 1

## 2017-03-10 MED ORDER — ACETAMINOPHEN 325 MG PO TABS
650.0000 mg | ORAL_TABLET | Freq: Four times a day (QID) | ORAL | Status: DC | PRN
  Administered 2017-03-11 – 2017-03-24 (×14): 650 mg via ORAL
  Filled 2017-03-10 (×14): qty 2

## 2017-03-10 MED ORDER — ACETAMINOPHEN 650 MG RE SUPP: 650.0000 mg | Freq: Four times a day (QID) | RECTAL | Status: DC | PRN

## 2017-03-10 MED ORDER — CALCIUM CARBONATE ANTACID 500 MG PO CHEW: 1.0000 | CHEWABLE_TABLET | Freq: Three times a day (TID) | ORAL | Status: DC | PRN

## 2017-03-10 MED ORDER — SODIUM CHLORIDE 0.9% FLUSH
3.0000 mL | Freq: Two times a day (BID) | INTRAVENOUS | Status: DC
  Administered 2017-03-10 – 2017-03-26 (×20): 3 mL via INTRAVENOUS

## 2017-03-10 MED ORDER — PANTOPRAZOLE SODIUM 40 MG PO TBEC
40.0000 mg | DELAYED_RELEASE_TABLET | Freq: Every day | ORAL | Status: DC
  Administered 2017-03-10 – 2017-03-26 (×16): 40 mg via ORAL
  Filled 2017-03-10 (×16): qty 1

## 2017-03-10 MED ORDER — SODIUM CHLORIDE 0.9% FLUSH
10.0000 mL | INTRAVENOUS | Status: DC | PRN
  Administered 2017-03-14 – 2017-03-23 (×7): 10 mL
  Administered 2017-03-24: 20 mL
  Administered 2017-03-24: 10 mL
  Administered 2017-03-25: 30 mL
  Filled 2017-03-10 (×10): qty 40

## 2017-03-10 MED ORDER — INSULIN GLARGINE 100 UNIT/ML ~~LOC~~ SOLN
15.0000 [IU] | Freq: Every day | SUBCUTANEOUS | Status: DC
  Administered 2017-03-11 – 2017-03-26 (×14): 15 [IU] via SUBCUTANEOUS
  Filled 2017-03-10 (×21): qty 0.15

## 2017-03-10 MED ORDER — MORPHINE SULFATE (PF) 4 MG/ML IV SOLN
1.0000 mg | INTRAVENOUS | Status: DC | PRN
  Administered 2017-03-10 – 2017-03-12 (×9): 1 mg via INTRAVENOUS
  Filled 2017-03-10 (×9): qty 1

## 2017-03-10 MED ORDER — SENNOSIDES-DOCUSATE SODIUM 8.6-50 MG PO TABS
2.0000 | ORAL_TABLET | Freq: Every day | ORAL | Status: DC
  Administered 2017-03-12 – 2017-03-25 (×11): 2 via ORAL
  Filled 2017-03-10 (×15): qty 2

## 2017-03-10 MED ORDER — METOPROLOL SUCCINATE ER 50 MG PO TB24
50.0000 mg | ORAL_TABLET | Freq: Two times a day (BID) | ORAL | Status: DC
  Administered 2017-03-10 – 2017-03-26 (×29): 50 mg via ORAL
  Filled 2017-03-10 (×33): qty 1

## 2017-03-10 MED ORDER — ASPIRIN EC 81 MG PO TBEC
81.0000 mg | DELAYED_RELEASE_TABLET | Freq: Every day | ORAL | Status: DC
  Administered 2017-03-11 – 2017-03-21 (×10): 81 mg via ORAL
  Filled 2017-03-10 (×10): qty 1

## 2017-03-10 MED ORDER — METHOCARBAMOL 500 MG PO TABS
500.0000 mg | ORAL_TABLET | Freq: Three times a day (TID) | ORAL | Status: DC | PRN
  Administered 2017-03-10 – 2017-03-20 (×22): 500 mg via ORAL
  Filled 2017-03-10 (×22): qty 1

## 2017-03-10 MED ORDER — HEPARIN SODIUM (PORCINE) 5000 UNIT/ML IJ SOLN
5000.0000 [IU] | Freq: Three times a day (TID) | INTRAMUSCULAR | Status: DC
  Filled 2017-03-10 (×2): qty 1

## 2017-03-10 MED ORDER — FERROUS SULFATE 325 (65 FE) MG PO TABS
325.0000 mg | ORAL_TABLET | Freq: Two times a day (BID) | ORAL | Status: DC
  Administered 2017-03-11 – 2017-03-26 (×29): 325 mg via ORAL
  Filled 2017-03-10 (×29): qty 1

## 2017-03-10 MED ORDER — ADULT MULTIVITAMIN W/MINERALS CH
1.0000 | ORAL_TABLET | Freq: Every day | ORAL | Status: DC
  Administered 2017-03-11 – 2017-03-26 (×15): 1 via ORAL
  Filled 2017-03-10 (×15): qty 1

## 2017-03-10 MED ORDER — HYDRALAZINE HCL 100 MG PO TABS: 100.0000 mg | ORAL_TABLET | Freq: Two times a day (BID) | ORAL | Status: DC

## 2017-03-10 MED ORDER — CALCITRIOL 0.25 MCG PO CAPS
0.2500 ug | ORAL_CAPSULE | ORAL | Status: DC
  Administered 2017-03-11 – 2017-03-26 (×8): 0.25 ug via ORAL
  Filled 2017-03-10 (×11): qty 1

## 2017-03-10 MED ORDER — AMLODIPINE BESYLATE 10 MG PO TABS
10.0000 mg | ORAL_TABLET | Freq: Every day | ORAL | Status: DC
  Administered 2017-03-11 – 2017-03-25 (×14): 10 mg via ORAL
  Filled 2017-03-10 (×16): qty 1

## 2017-03-10 MED ORDER — HYDRALAZINE HCL 50 MG PO TABS
100.0000 mg | ORAL_TABLET | Freq: Two times a day (BID) | ORAL | Status: DC
  Administered 2017-03-10 – 2017-03-26 (×29): 100 mg via ORAL
  Filled 2017-03-10 (×34): qty 2

## 2017-03-10 MED ORDER — CIPROFLOXACIN HCL 500 MG PO TABS
250.0000 mg | ORAL_TABLET | Freq: Every day | ORAL | Status: DC
  Administered 2017-03-11 – 2017-03-23 (×11): 250 mg via ORAL
  Filled 2017-03-10 (×12): qty 1

## 2017-03-10 MED ORDER — SERTRALINE HCL 100 MG PO TABS
100.0000 mg | ORAL_TABLET | Freq: Every day | ORAL | Status: DC
  Administered 2017-03-11 – 2017-03-26 (×15): 100 mg via ORAL
  Filled 2017-03-10 (×15): qty 1

## 2017-03-10 MED ORDER — SODIUM CHLORIDE 0.9 % IV SOLN
250.0000 mL | INTRAVENOUS | Status: DC | PRN
  Administered 2017-03-12 – 2017-03-20 (×2): via INTRAVENOUS

## 2017-03-10 MED ORDER — ONDANSETRON HCL 4 MG/2ML IJ SOLN
4.0000 mg | Freq: Four times a day (QID) | INTRAMUSCULAR | Status: DC | PRN
  Administered 2017-03-12 – 2017-03-13 (×3): 4 mg via INTRAVENOUS
  Filled 2017-03-10 (×3): qty 2

## 2017-03-10 MED ORDER — DIPHENHYDRAMINE HCL 25 MG PO CAPS
25.0000 mg | ORAL_CAPSULE | Freq: Four times a day (QID) | ORAL | Status: DC | PRN
  Administered 2017-03-15 – 2017-03-21 (×8): 25 mg via ORAL
  Filled 2017-03-10 (×7): qty 1

## 2017-03-10 MED ORDER — TRAMADOL HCL 50 MG PO TABS
50.0000 mg | ORAL_TABLET | Freq: Four times a day (QID) | ORAL | Status: DC | PRN
  Filled 2017-03-10: qty 1

## 2017-03-10 NOTE — H&P (Signed)
History and Physical    Brandy Houston:[id number] DOB: [birth year] DOA: 03/10/2017  PCP: [name], MD   Patient coming from: Dr [name] office    Chief Complaint: wound dehiscence   HPI: Brandy Houston is a 61 y.o. female with medical history significant of left knee dehiscence. Patient underwent resection, arthroplasty and placement of antibiotic spacer 32 days ago on her left knee. She went for a follow-up to the outpatient clinic, she was found to have exposed subcutaneous tissue with no drainage but with persistent large joint effusion. Her options were discussed with her including amputation. She was hospitalized for medical optimization in preparation for possible surgery.   Complains of pain on the left knee 6/10, dull in nature, worse with movement and improved with analgesics, no radiation or associated symptoms, no fever or chills.     Review of Systems:  1. General: No fevers, no chills, no weight gain or weight loss 2. ENT: No runny nose or sore throat, no hearing disturbances 3. Pulmonary: No dyspnea, cough, wheezing, or hemoptysis 4. Cardiovascular: No angina, claudication, lower extremity edema, pnd or orthopnea 5. Gastrointestinal: No nausea or vomiting, no diarrhea or constipation 6. Hematology: No easy bruisability or frequent infections 7. Urology: No dysuria, hematuria or increased urinary frequency 8. Dermatology: No rashes. 9. Neurology: No seizures or paresthesias 10. Musculoskeletal: left knee pain as mentioned in hpi  Past Medical History:  Diagnosis Date  . Anemia   . Anxiety   . Arthritis    "in my joints" (02/05/2017)  . Chronic diastolic CHF (congestive heart failure) ([place])   . CKD (chronic kidney disease), stage IV ([name])    stage IV. previous HD, none currently 10/30/15 (confirmed 02/05/2017)  . Depression    Chronic  . Diabetic peripheral neuropathy ([place])   . Dysrhythmia    tachycardia, normal ECHO 08-09-14  . GERD (gastroesophageal reflux  disease)   . Heart murmur    "just dx'd today" (02/05/2017)  . Hepatitis C    "tx'd in 2016; I'm negative now" (02/05/2017)  . History of blood transfusion 2017   "w/knee replacement"  . Hypertension   . Osteoarthritis of left knee   . Peripheral neuropathy   . Protein calorie malnutrition ([place])   . Septic arthritis ([place])   . Slow transit constipation   . Type II diabetes mellitus ([name])    IDDM  . Uncontrolled hypertension 02/18/2013  . Unsteady gait     Past Surgical History:  Procedure Laterality Date  . APPENDECTOMY    . AV FISTULA PLACEMENT Left 09/13/2014   Procedure: Brachial Artery to Brachial Vein Gortex Four - Seven Stretch GRAFT INSERTION Left Forearm;  Surgeon: Mal Misty, MD;  Location: [facility];  Service: Vascular;  Laterality: Left;  . CHOLECYSTECTOMY OPEN    . COLON SURGERY    . EXCISIONAL TOTAL KNEE ARTHROPLASTY WITH ANTIBIOTIC SPACERS Left 02/06/2017   Procedure: Incisional total iknee with antibiotic spacer ;  Surgeon: Leandrew Koyanagi, MD;  Location: [facility];  Service: Orthopedics;  Laterality: Left;  . IR FLUORO GUIDE CV LINE RIGHT  02/10/2017  . IR US GUIDE VASC ACCESS RIGHT  02/10/2017  . KNEE ARTHROSCOPY Right 08/10/2014   Procedure: ARTHROSCOPY I & D KNEE;  Surgeon: Marianna Payment, MD;  Location: [facility];  Service: Orthopedics;  Laterality: Right;  . KNEE ARTHROSCOPY Left 08/11/2014   Procedure: ARTHROSCOPIC WASHOUT LEFT KNEE;  Surgeon: Marianna Payment, MD;  Location: [place];  Service: Orthopedics;  Laterality: Left;  . KNEE ARTHROSCOPY Left 08/19/2014   Procedure: ARTHROSCOPIC WASHOUT LEFT KNEE;  Surgeon: Leandrew Koyanagi, MD;  Location: [facility];  Service: Orthopedics;  Laterality: Left;  . KNEE ARTHROSCOPY WITH LATERAL MENISECTOMY Left 04/04/2015   Procedure: AND PARTIAL LATERAL MENISECTOMY;  Surgeon: Leandrew Koyanagi, MD;  Location: [facility];  Service: Orthopedics;  Laterality: Left;  . KNEE ARTHROSCOPY WITH MEDIAL MENISECTOMY Left 04/04/2015   Procedure:  LEFT KNEE ARTHROSCOPY WITH PARTIAL MEDIAL MENISCECTOMY  AND SYNOVECTOMY;  Surgeon: Leandrew Koyanagi, MD;  Location: [facility];  Service: Orthopedics;  Laterality: Left;  . SHOULDER ARTHROSCOPY Bilateral 08/10/2014   Procedure: I & D BILATERAL SHOULDERS ;  Surgeon: Marianna Payment, MD;  Location: [facility];  Service: Orthopedics;  Laterality: Bilateral;  . SMALL INTESTINE SURGERY     Due to Small Bowel Obstruction; "fixed it when they did my gallbladder OR"  . TEE WITHOUT CARDIOVERSION N/A 08/14/2014   Procedure: TRANSESOPHAGEAL ECHOCARDIOGRAM (TEE);  Surgeon: Thayer Headings, MD;  Location: [facility];  Service: Cardiovascular;  Laterality: N/A;  . TENOSYNOVECTOMY Right 08/11/2014   Procedure: RIGHT WRIST IRRIGATION AND DEBRIDEMENT, TENOSYNOVECTOMY;  Surgeon: Marianna Payment, MD;  Location: [facility];  Service: Orthopedics;  Laterality: Right;  . TOTAL KNEE ARTHROPLASTY Left 11/07/2015   Procedure: LEFT TOTAL KNEE ARTHROPLASTY WITH REVISION OF IMPLANTS;  Surgeon: Leandrew Koyanagi, MD;  Location: [facility];  Service: Orthopedics;  Laterality: Left;  . TUBAL LIGATION       reports that she has been smoking Cigarettes.  She has a 17.50 pack-year smoking history. She has never used smokeless tobacco. She reports that she does not drink alcohol or use drugs.  Allergies  Allergen Reactions  . Compazine [Prochlorperazine] Shortness Of Breath and Swelling    TONGUE SWELLS  . Shellfish-Derived Products Anaphylaxis  . Iodinated Diagnostic Agents Hives and Rash  . Omnipaque [Iohexol] Hives    Family History  Problem Relation Age of Onset  . Cancer Mother   . Heart disease Father   . Cancer Sister   . Hypertension Sister   . Cancer Brother   . Diabetes Brother   . Hypertension Brother    Unacceptable: Noncontributory, unremarkable, or negative. Acceptable: Family history reviewed and not pertinent (If you reviewed it)  Prior to Admission medications   Medication Sig Start Date End Date  Taking? Authorizing Provider  acetaminophen (TYLENOL) 500 MG tablet Take 1,000 mg by mouth every 6 (six) hours as needed for mild pain.    [provider]  amLODipine (NORVASC) 10 MG tablet Take 10 mg by mouth at bedtime.     [provider]  aspirin EC 81 MG tablet Take 81 mg by mouth daily.    [provider]  Blood Glucose Monitoring Suppl (ACCU-CHEK AVIVA PLUS) W/DEVICE KIT Check sugars TID for E11.65 01/29/15   Chari Manning A, NP  calcitRIOL (ROCALTROL) 0.25 MCG capsule Take 0.25 mcg by mouth every Monday, Wednesday, and Friday.    [provider]  calcium carbonate (TUMS EX) 750 MG chewable tablet Chew 1 tablet by mouth 3 (three) times daily as needed for heartburn.     [provider]  ciprofloxacin (CIPRO) 250 MG tablet Take 1 tablet (250 mg total) by mouth daily. 02/18/17 03/25/17  Elodia Florence., MD  daptomycin (CUBICIN) IVPB Inject 420 mg into the vein every other day. Indication:  Left prosthetic knee infection Last Day of Therapy:  03/20/17 Labs -  Once weekly:  CBC/D, BMP, and CPK Labs - Every other week:  ESR and CRP 02/18/17 03/25/17  Elodia Florence., MD  Darbepoetin Alfa (ARANESP) 100 MCG/0.5ML SOSY injection Inject 100 mcg into the skin every 30 (thirty) days.    [provider]  Diclofenac Sodium 2 % SOLN Place 2 g onto the skin 2 (two) times daily as needed. 08/19/16   Leandrew Koyanagi, MD  diphenhydrAMINE (BENADRYL) 25 mg capsule Take 1 capsule (25 mg total) by mouth every 6 (six) hours as needed for itching. 09/28/14   Love, Ivan Anchors, PA-C  enoxaparin (LOVENOX) 30 MG/0.3ML injection Inject 0.3 mLs (30 mg total) into the skin daily. 02/19/17 03/05/17  Elodia Florence., MD  ferrous sulfate 325 (65 FE) MG tablet Take 325 mg by mouth 2 (two) times daily with a meal.    [provider]  furosemide (LASIX) 80 MG tablet Take 1 tablet (80 mg total) by mouth 2 (two) times daily. 02/18/17 03/20/17  Elodia Florence.,  MD  gabapentin (NEURONTIN) 100 MG capsule Take 1 capsule (100 mg total) by mouth 2 (two) times daily. Patient taking differently: Take 100 mg by mouth 3 (three) times daily.  12/06/14   Lance Bosch, NP  glucose blood (ACCU-CHEK AVIVA) test strip Check sugars TID for E11.65 Patient taking differently: 1 each by Other route 2 (two) times daily. Check sugars TID for E11.65 01/29/15   Chari Manning A, NP  hydrALAZINE (APRESOLINE) 100 MG tablet Take 100 mg by mouth 2 (two) times daily.  10/17/15   [provider]  insulin glargine (LANTUS) 100 UNIT/ML injection Inject 15 Units into the skin daily before breakfast.     [provider]  Lancet Devices [facility]) lancets Check sugars TID for E11.65 Patient taking differently: 1 each by Other route 2 (two) times daily. Check sugars TID for E11.65 01/29/15   Chari Manning A, NP  methocarbamol (ROBAXIN) 500 MG tablet TAKE 1 TABLET BY MOUTH EVERY 6 HOURS AS NEEDED FOR MUSCLE SPASM 01/19/17   Leandrew Koyanagi, MD  metoprolol succinate (TOPROL-XL) 50 MG 24 hr tablet Take 50 mg by mouth 2 (two) times daily.  10/17/15   [provider]  Multiple Vitamin (MULTIVITAMIN WITH MINERALS) TABS tablet Take 1 tablet by mouth daily.    [provider]  nicotine (NICODERM CQ - DOSED IN MG/24 HOURS) 14 mg/24hr patch Place 1 patch (14 mg total) onto the skin daily. 02/19/17   Elodia Florence., MD  omeprazole (PRILOSEC) 20 MG capsule Take 20 mg by mouth daily before breakfast.  10/04/15   [provider]  Potassium Chloride ER 20 MEQ TBCR Take 20 mEq by mouth 2 (two) times daily.  10/04/15   [provider]  sennosides-docusate sodium (SENOKOT-S) 8.6-50 MG tablet Take 2 tablets by mouth at bedtime.    [provider]  sertraline (ZOLOFT) 100 MG tablet Take 1 tablet (100 mg total) by mouth daily. 03/15/15   Carlyle Basques, MD  traMADol (ULTRAM) 50 MG tablet Take 1-2 tablets (50-100 mg total) by mouth every 6 (six)  hours as needed. Patient taking differently: Take 50-100 mg by mouth every 6 (six) hours as needed (for pain.).  02/03/17   Leandrew Koyanagi, MD    Physical Exam: Vitals:   03/10/17 1700  BP: (!) 165/79  Pulse: 67  Resp: 18  Temp: 98.9 F (37.2 C)  TempSrc: Oral  SpO2: 99%    Constitutional:  deconditioned Vitals:   03/10/17 1700  BP: (!) 165/79  Pulse: 67  Resp: 18  Temp: 98.9 F (37.2 C)  TempSrc: Oral  SpO2: 99%   Eyes: PERRL, lids and conjunctivae normal Head normocephalic, nose and ears with no deformities.  ENMT: Mucous membranes are moist. Posterior pharynx clear of any exudate or lesions.Normal dentition.  Neck: normal, supple, no masses, no thyromegaly Respiratory: clear to auscultation bilaterally, no wheezing, no crackles. Normal respiratory effort. No accessory muscle use.  Cardiovascular: Regular rate and rhythm, no murmurs / rubs / gallops. No extremity edema. 2+ pedal pulses. No carotid bruits.  Abdomen: no tenderness, no masses palpated. No hepatosplenomegaly. Bowel sounds positive.  Musculoskeletal: no clubbing / cyanosis. No joint deformity upper and lower extremities, no contractures. Normal muscle tone. Left knee with edema, no erythema but fluctuance and purulence. Tender to palpation.  Skin: no rashes, lesions, ulcers. No induration Neurologic: CN 2-12 grossly intact. Sensation intact, DTR normal. Strength 5/5 in all 4.     Labs on Admission: I have personally reviewed following labs and imaging studies  CBC: No results for input(s): WBC, NEUTROABS, HGB, HCT, MCV, PLT in the last 168 hours. Basic Metabolic Panel: No results for input(s): NA, K, CL, CO2, GLUCOSE, BUN, CREATININE, CALCIUM, MG, PHOS in the last 168 hours. GFR: CrCl cannot be calculated (Unknown ideal weight.). Liver Function Tests: No results for input(s): AST, ALT, ALKPHOS, BILITOT, PROT, ALBUMIN in the last 168 hours. No results for input(s): LIPASE, AMYLASE in the last 168 hours. No  results for input(s): AMMONIA in the last 168 hours. Coagulation Profile: No results for input(s): INR, PROTIME in the last 168 hours. Cardiac Enzymes: No results for input(s): CKTOTAL, CKMB, CKMBINDEX, TROPONINI in the last 168 hours. BNP (last 3 results) No results for input(s): PROBNP in the last 8760 hours. HbA1C: No results for input(s): HGBA1C in the last 72 hours. CBG: No results for input(s): GLUCAP in the last 168 hours. Lipid Profile: No results for input(s): CHOL, HDL, LDLCALC, TRIG, CHOLHDL, LDLDIRECT in the last 72 hours. Thyroid Function Tests: No results for input(s): TSH, T4TOTAL, FREET4, T3FREE, THYROIDAB in the last 72 hours. Anemia Panel: No results for input(s): VITAMINB12, FOLATE, FERRITIN, TIBC, IRON, RETICCTPCT in the last 72 hours. Urine analysis:    Component Value Date/Time   COLORURINE AMBER (A) 02/08/2017 1138   APPEARANCEUR TURBID (A) 02/08/2017 1138   LABSPEC 1.014 02/08/2017 1138   PHURINE 5.0 02/08/2017 1138   GLUCOSEU NEGATIVE 02/08/2017 1138   HGBUR MODERATE (A) 02/08/2017 1138   BILIRUBINUR NEGATIVE 02/08/2017 1138   KETONESUR NEGATIVE 02/08/2017 1138   PROTEINUR 100 (A) 02/08/2017 1138   UROBILINOGEN 0.2 09/02/2014 1504   NITRITE NEGATIVE 02/08/2017 1138   LEUKOCYTESUR LARGE (A) 02/08/2017 1138    Radiological Exams on Admission: No results found.  EKG: Independently reviewed.   Assessment/Plan Active Problems:   Wound dehiscence  1. Left knee wound dehiscence. Patient will be admitted to medical unit, she will continue with antibiotic therapy with daptomycin, will check cell count,  and continue to follow temperature curve. Continue pain control and follow-up  Orthopedic consultation recommendations. If patient febrile will do blood cultures.  2. Hypertension. Continue amlodine, hydralazine and metoprolol for blood pressure control.  3.  Chronic kidney disease V. Patient euvolemic, will check chemistry, keep negative fluid balance.     4. Type 2 diabetes mellitus. Will continue insulin sliding-scale for glucose coverage and monitoring, continue glargine for basal  Insulin therapy.   5. Depression.ontinue  Zoloft.   DVT prophylaxis: heparin Code Status: full  Family Communication:   Disposition Plan: home  Consults called: orthopedics  Admission status: inpatient    Kiron Osmun Gerome Apley MD [facility] Pager [phone]  If 7PM-7AM, please contact night-coverage [email or web address] Password [facility]  03/10/2017, 6:04 PM

## 2017-03-10 NOTE — Progress Notes (Signed)
Brandy Houston follows up today for wound dehiscence of her left knee. She underwent resection arthroplasty and placement of antibiotic spacer 32 days ago. She denies any constitutional symptoms. She is currently on daptomycin. She saw her nephrologist yesterday and she is currently not a candidate for dialysis.  Physical exam of the left knee shows a wound dehiscence of her surgical midline incision. There is exposed subcutaneous tissue. There is no active drainage. There is eschar of the incision. She continues to have the persistent large joint effusion.  At this point we had a long discussion with the patient and her son regarding treatment options. She does need surgery for closure of the wound versus an above-the-knee amputation. She is a very medically complex individual with an overall poor protoplasm with increased risk for any surgical complications. She has required increased hospital stays postoperatively each time she has had surgery. With her unresolved kidney disease and uremia her platelet dysfunction is not likely to correct itself which would cause her to have recurrent effusions which would ultimately result in an infection. She currently has an antibiotic spacer in her knee which would mean that she would need at least 2 additional surgeries which I am not sure if she can tolerate or fully rehabilitate from. In the big picture an above the knee amputation may be the best option for her in that it would be the definitive solution to her recurrent knee problems and persistent recurrent effusions. She could potentially be walking with a prosthesis in 3-4 months.    For now we will get her admitted to the hospitalist service for medical optimization for surgery. We will discuss this further regarding which direction to go surgically.

## 2017-03-10 NOTE — Progress Notes (Signed)
Pharmacy Antibiotic Note  Brandy Houston is a 60 y.o. 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 Pt is readmitted today with non-healing wound. Pending decision for surgical interventions. Plan to continue current antibiotic regimen, pharmacy is consulted to manage antibiotics dosing and monitoring. Noted she was discharged on daptomycin 420 mg IV Q 48 hrs, most recent dose was 9/24 AM per patient. She was also on cipro po 250 mg daily.   Pt with CKD, scr 3.48, stable from previous admission, est. crcl ~ 15-20 ml/min. Pt is afebrile, wbc 8.1K. Most recent CK = 38 in chart.  Plan: - Daptomycin 420 mg IV Q 48 hrs, next dose tomorrow AM - Cipro 250 mg PO daily - f/u surgery plans - weekly CK, next tomorrow AM      Temp (24hrs), Avg:98.9 F (37.2 C), Min:98.9 F (37.2 C), Max:98.9 F (37.2 C)   Recent Labs Lab 03/10/17 1919  WBC 8.1  CREATININE 3.48*    CrCl cannot be calculated (Unknown ideal weight.).    Allergies  Allergen Reactions  . Compazine [Prochlorperazine] Shortness Of Breath and Swelling    TONGUE SWELLS  . Shellfish-Derived Products Anaphylaxis  . Iodinated Diagnostic Agents Hives and Rash  . Omnipaque [Iohexol] Hives    Antimicrobials this admission: Cubicin 8/26 >> 8/24 Vanc >> 8/25 Cipro 8/27 >>  8/26 CK = 77 9/3 CK = 38   Microbiology results: 8/21 left knee aspirate - negative 8/24 left knee tissue - rare bacteroides oralis (beta lactamase +) - may be contaminant per ID, to f/u this week 8/25 BCx - NF 8/26 UCx - > 100K yeast  Thank you for allowing pharmacy to be a part of this patient's care.  Maryanna Shape, PharmD, BCPS  Clinical Pharmacist  Pager: [phone]   03/10/2017 8:46 PM

## 2017-03-10 NOTE — Progress Notes (Signed)
[place] [facility] hospital team is following Brandy Houston as an inpatient.  [facility] will provide Home Infusion Pharmacy services for IV ABX upon DC home in partnership with [facility].   If patient discharges after hours, please call [phone].   [name] 03/10/2017, 7:52 PM

## 2017-03-10 NOTE — Progress Notes (Signed)
Patient direct admit from Dr. [name] office s/p dehiscense of left knee. Patient a&ox4. Complaining of 3 out of 10 pain to left knee. Knee is currently wrapped with ABD, compression ace, and KI; knee assessed and minimal serousanginous drainage noted, dressing reinforced. Patient assisted to bed from wheelchair, changed into gown. IV team paged to assess patient's current right internal jugular CVC. VVS. Blood pressure (!) 165/79, pulse 67, temperature 98.9 F (37.2 C), temperature source Oral, resp. rate 18, last menstrual period 03/16/2002, SpO2 99 %. Dr. Marily Memos attending paged to inform of patient's arrival to floor and for orders. Patient educated about plan per Dr. [name] note and informed of surroundings. No questions at this time per patient, resting quietly in bed with call bell in reach.

## 2017-03-11 ENCOUNTER — Encounter (HOSPITAL_COMMUNITY): Payer: Self-pay | Admitting: *Deleted

## 2017-03-11 DIAGNOSIS — E1169 Type 2 diabetes mellitus with other specified complication: Secondary | ICD-10-CM

## 2017-03-11 DIAGNOSIS — Z794 Long term (current) use of insulin: Secondary | ICD-10-CM

## 2017-03-11 DIAGNOSIS — N189 Chronic kidney disease, unspecified: Secondary | ICD-10-CM

## 2017-03-11 DIAGNOSIS — D631 Anemia in chronic kidney disease: Secondary | ICD-10-CM

## 2017-03-11 DIAGNOSIS — N184 Chronic kidney disease, stage 4 (severe): Secondary | ICD-10-CM

## 2017-03-11 LAB — CBC
HCT: 23.7 % — ABNORMAL LOW (ref 36.0–46.0)
Hemoglobin: 7.3 g/dL — ABNORMAL LOW (ref 12.0–15.0)
MCH: 26.8 pg (ref 26.0–34.0)
MCHC: 30.8 g/dL (ref 30.0–36.0)
MCV: 87.1 fL (ref 78.0–100.0)
Platelets: 263 10*3/uL (ref 150–400)
RBC: 2.72 MIL/uL — ABNORMAL LOW (ref 3.87–5.11)
RDW: 16.5 % — ABNORMAL HIGH (ref 11.5–15.5)
WBC: 5.9 10*3/uL (ref 4.0–10.5)

## 2017-03-11 LAB — COMPREHENSIVE METABOLIC PANEL
ALT: 33 U/L (ref 14–54)
AST: 20 U/L (ref 15–41)
Albumin: 2.6 g/dL — ABNORMAL LOW (ref 3.5–5.0)
Alkaline Phosphatase: 164 U/L — ABNORMAL HIGH (ref 38–126)
Anion gap: 11 (ref 5–15)
BUN: 47 mg/dL — ABNORMAL HIGH (ref 6–20)
CO2: 20 mmol/L — ABNORMAL LOW (ref 22–32)
Calcium: 8.5 mg/dL — ABNORMAL LOW (ref 8.9–10.3)
Chloride: 105 mmol/L (ref 101–111)
Creatinine, Ser: 3.46 mg/dL — ABNORMAL HIGH (ref 0.44–1.00)
GFR calc Af Amer: 15 mL/min — ABNORMAL LOW (ref >60)
GFR calc non Af Amer: 13 mL/min — ABNORMAL LOW (ref >60)
Glucose, Bld: 140 mg/dL — ABNORMAL HIGH (ref 65–99)
Potassium: 4.2 mmol/L (ref 3.5–5.1)
Sodium: 136 mmol/L (ref 135–145)
Total Bilirubin: 0.7 mg/dL (ref 0.3–1.2)
Total Protein: 7.1 g/dL (ref 6.5–8.1)

## 2017-03-11 LAB — GLUCOSE, CAPILLARY
Glucose-Capillary: 147 mg/dL — ABNORMAL HIGH (ref 65–99)
Glucose-Capillary: 132 mg/dL — ABNORMAL HIGH (ref 65–99)
Glucose-Capillary: 133 mg/dL — ABNORMAL HIGH (ref 65–99)
Glucose-Capillary: 175 mg/dL — ABNORMAL HIGH (ref 65–99)
Glucose-Capillary: 147 mg/dL — ABNORMAL HIGH (ref 65–99)

## 2017-03-11 MED ORDER — GABAPENTIN 100 MG PO CAPS
100.0000 mg | ORAL_CAPSULE | Freq: Two times a day (BID) | ORAL | Status: DC
  Administered 2017-03-11 – 2017-03-26 (×30): 100 mg via ORAL
  Filled 2017-03-11 (×30): qty 1

## 2017-03-11 NOTE — Progress Notes (Signed)
Discussed with patient today at bedside regarding treatment options.  We agreed to proceed with I&D and closure if possible of the wound dehiscence.  This will give her and her family time to discuss the possibility and more likely probability of AKA in the future.

## 2017-03-11 NOTE — Progress Notes (Signed)
[name], female   DOB: [birth year], 61 y.o.   MRN: [id number]  PROGRESS NOTE    Brandy Houston  TIR:[id number] DOB: 1955/10/04 DOA: 03/10/2017 PCP: [name], MD   Brief Narrative:  61 y.o. female with medical history significant of left knee dehiscence who underwent resection, arthroplasty and placement of antibiotic spacer >4 weeks ago on her left knee and is currently on Iv Daptomycin and oral Cipro per ID recommendations. She was admitted on 03/10/17 as she was found to have exposed subcutaneous tissue with no drainage but with persistent large joint effusion.   Assessment & Plan:   Active Problems:   Wound dehiscence   1. Left knee wound dehiscence.  - Continue Daptomycin iv and oral Cipro (current outpatient regimen as per ID recommendations during prior hospitalization) - Follow orthopedics recommendations - Continue pain control   2. Hypertension.  - Continue amlodine, hydralazine, lasix and metoprolol for blood pressure control. - Monitor Blood pressure  3.  Chronic kidney disease V.  - Patient euvolemic,  keep negative fluid balance. Continue Lasix - monitor AM labs  4. Type 2 diabetes mellitus. Will continue insulin sliding-scale for glucose coverage and monitoring, continue glargine for basal  Insulin therapy.   5. Depression. - continue Zoloft.  6. Chronic anemia probably anemia of chronic disease from CKD - Hemoglobin stable. Monitor   DVT prophylaxis: heparin  Code Status:  Full Family Communication: none at bedside Disposition Plan: pending clinical outcome  Consultants: Orthopedics  Procedures: None  Antimicrobials: Daptomycin iv 02/08/17>> Oral cipro 02/09/17>>   Subjective: Patient seen and examined at bedside. She denies overnight fever, nausea or vomiting. She complains of left knee pain.  Objective: Vitals:   03/10/17 1700 03/10/17 1901 03/10/17 2133  BP: (!) 165/79  (!) 161/74  Pulse: 67  66  Resp: 18  18  Temp:  98.9 F (37.2 C)  98.8 F (37.1 C)  TempSrc: Oral  Oral  SpO2: 99% 99% 97%   No intake or output data in the 24 hours ending 03/11/17 0847 There were no vitals filed for this visit.  Examination:  General exam: Appears calm and comfortable  Respiratory system: Bilateral decreased breath sound at bases Cardiovascular system: S1 & S2 heard, rate controlled Gastrointestinal system: Abdomen is nondistended, soft and nontender. Normal bowel sounds heard. Extremities: No cyanosis, clubbing, trace edema; Left knee dressing present   Data Reviewed: I have personally reviewed following labs and imaging studies  CBC:  Recent Labs Lab 03/10/17 1919 03/11/17 0522  WBC 8.1 5.9  NEUTROABS 6.3  --   HGB 8.5* 7.3*  HCT 27.5* 23.7*  MCV 86.8 87.1  PLT 301 676   Basic Metabolic Panel:  Recent Labs Lab 03/10/17 1919 03/11/17 0522  NA 134* 136  K 4.4 4.2  CL 107 105  CO2 21* 20*  GLUCOSE 96 140*  BUN 48* 47*  CREATININE 3.48* 3.46*  CALCIUM 8.8* 8.5*   GFR: CrCl cannot be calculated (Unknown ideal weight.). Liver Function Tests:  Recent Labs Lab 03/10/17 1919 03/11/17 0522  AST 37 20  ALT 40 33  ALKPHOS 169* 164*  BILITOT 0.3 0.7  PROT 8.1 7.1  ALBUMIN 2.9* 2.6*   No results for input(s): LIPASE, AMYLASE in the last 168 hours. No results for input(s): AMMONIA in the last 168 hours. Coagulation Profile: No results for input(s): INR, PROTIME in the last 168 hours. Cardiac Enzymes: No results for input(s): CKTOTAL, CKMB, CKMBINDEX, TROPONINI in the last 168  hours. BNP (last 3 results) No results for input(s): PROBNP in the last 8760 hours. HbA1C: No results for input(s): HGBA1C in the last 72 hours. CBG:  Recent Labs Lab 03/11/17 0147 03/11/17 0752  GLUCAP 147* 132*   Lipid Profile: No results for input(s): CHOL, HDL, LDLCALC, TRIG, CHOLHDL, LDLDIRECT in the last 72 hours. Thyroid Function Tests: No results for input(s): TSH, T4TOTAL, FREET4, T3FREE,  THYROIDAB in the last 72 hours. Anemia Panel: No results for input(s): VITAMINB12, FOLATE, FERRITIN, TIBC, IRON, RETICCTPCT in the last 72 hours. Sepsis Labs: No results for input(s): PROCALCITON, LATICACIDVEN in the last 168 hours.  No results found for this or any previous visit (from the past 240 hour(s)).       Radiology Studies: No results found.      Scheduled Meds: . amLODipine  10 mg Oral QHS  . aspirin EC  81 mg Oral Daily  . calcitRIOL  0.25 mcg Oral Q M,W,F  . ciprofloxacin  250 mg Oral Q breakfast  . ferrous sulfate  325 mg Oral BID WC  . furosemide  80 mg Oral BID  . heparin  5,000 Units Subcutaneous Q8H  . hydrALAZINE  100 mg Oral BID  . insulin aspart  0-9 Units Subcutaneous TID WC  . insulin glargine  15 Units Subcutaneous QAC breakfast  . metoprolol succinate  50 mg Oral BID  . multivitamin with minerals  1 tablet Oral Daily  . nicotine  14 mg Transdermal Daily  . pantoprazole  40 mg Oral Daily  . senna-docusate  2 tablet Oral QHS  . sertraline  100 mg Oral Daily  . sodium chloride flush  3 mL Intravenous Q12H   Continuous Infusions: . sodium chloride    . DAPTOmycin (CUBICIN)  IV       LOS: 1 day        Aline August, MD [facility] Pager [phone]  If 7PM-7AM, please contact night-coverage [email or web address] Password [facility] 03/11/2017, 8:47 AM

## 2017-03-12 ENCOUNTER — Inpatient Hospital Stay (HOSPITAL_COMMUNITY): Payer: Medicare Other | Admitting: Certified Registered Nurse Anesthetist

## 2017-03-12 ENCOUNTER — Encounter (HOSPITAL_COMMUNITY)
Admission: AD | Disposition: A | Discharge status: Skilled Nursing Facility | Payer: Self-pay | Source: Ambulatory Visit | Attending: Internal Medicine

## 2017-03-12 ENCOUNTER — Encounter (HOSPITAL_COMMUNITY): Payer: Self-pay | Admitting: Certified Registered"

## 2017-03-12 DIAGNOSIS — T8454XA Infection and inflammatory reaction due to internal left knee prosthesis, initial encounter: Secondary | ICD-10-CM

## 2017-03-12 DIAGNOSIS — T8130XA Disruption of wound, unspecified, initial encounter: Secondary | ICD-10-CM

## 2017-03-12 HISTORY — PX: IRRIGATION AND DEBRIDEMENT KNEE: SHX5185

## 2017-03-12 LAB — CBC WITH DIFFERENTIAL/PLATELET
Basophils Absolute: 0 10*3/uL (ref 0.0–0.1)
Basophils Relative: 0 %
Eosinophils Absolute: 0.2 10*3/uL (ref 0.0–0.7)
Eosinophils Relative: 2 %
HCT: 24 % — ABNORMAL LOW (ref 36.0–46.0)
Hemoglobin: 7.4 g/dL — ABNORMAL LOW (ref 12.0–15.0)
Lymphocytes Relative: 18 %
Lymphs Abs: 1.2 10*3/uL (ref 0.7–4.0)
MCH: 26.7 pg (ref 26.0–34.0)
MCHC: 30.8 g/dL (ref 30.0–36.0)
MCV: 86.6 fL (ref 78.0–100.0)
Monocytes Absolute: 0.7 10*3/uL (ref 0.1–1.0)
Monocytes Relative: 10 %
Neutro Abs: 4.6 10*3/uL (ref 1.7–7.7)
Neutrophils Relative %: 70 %
Platelets: 267 10*3/uL (ref 150–400)
RBC: 2.77 MIL/uL — ABNORMAL LOW (ref 3.87–5.11)
RDW: 16.2 % — ABNORMAL HIGH (ref 11.5–15.5)
WBC: 6.7 10*3/uL (ref 4.0–10.5)

## 2017-03-12 LAB — GLUCOSE, CAPILLARY
Glucose-Capillary: 117 mg/dL — ABNORMAL HIGH (ref 65–99)
Glucose-Capillary: 117 mg/dL — ABNORMAL HIGH (ref 65–99)
Glucose-Capillary: 105 mg/dL — ABNORMAL HIGH (ref 65–99)
Glucose-Capillary: 112 mg/dL — ABNORMAL HIGH (ref 65–99)
Glucose-Capillary: 135 mg/dL — ABNORMAL HIGH (ref 65–99)
Glucose-Capillary: 175 mg/dL — ABNORMAL HIGH (ref 65–99)

## 2017-03-12 LAB — BASIC METABOLIC PANEL
Anion gap: 7 (ref 5–15)
BUN: 45 mg/dL — ABNORMAL HIGH (ref 6–20)
CO2: 22 mmol/L (ref 22–32)
Calcium: 8.7 mg/dL — ABNORMAL LOW (ref 8.9–10.3)
Chloride: 106 mmol/L (ref 101–111)
Creatinine, Ser: 3.41 mg/dL — ABNORMAL HIGH (ref 0.44–1.00)
GFR calc Af Amer: 16 mL/min — ABNORMAL LOW (ref >60)
GFR calc non Af Amer: 13 mL/min — ABNORMAL LOW (ref >60)
Glucose, Bld: 122 mg/dL — ABNORMAL HIGH (ref 65–99)
Potassium: 3.9 mmol/L (ref 3.5–5.1)
Sodium: 135 mmol/L (ref 135–145)

## 2017-03-12 LAB — MAGNESIUM: Magnesium: 1.9 mg/dL (ref 1.7–2.4)

## 2017-03-12 LAB — CK: Total CK: 41 U/L (ref 38–234)

## 2017-03-12 LAB — C-REACTIVE PROTEIN: CRP: 1.9 mg/dL — ABNORMAL HIGH (ref <1.0)

## 2017-03-12 LAB — SURGICAL PCR SCREEN
MRSA, PCR: NEGATIVE
Staphylococcus aureus: NEGATIVE

## 2017-03-12 SURGERY — IRRIGATION AND DEBRIDEMENT KNEE
Anesthesia: General | Site: Knee | Laterality: Left

## 2017-03-12 MED ORDER — DEXAMETHASONE SODIUM PHOSPHATE 4 MG/ML IJ SOLN
INTRAMUSCULAR | Status: DC | PRN
  Administered 2017-03-12: 4 mg via INTRAVENOUS

## 2017-03-12 MED ORDER — EPHEDRINE SULFATE-NACL 50-0.9 MG/10ML-% IV SOSY
PREFILLED_SYRINGE | INTRAVENOUS | Status: DC | PRN
  Administered 2017-03-12 (×2): 10 mg via INTRAVENOUS
  Administered 2017-03-12 (×3): 5 mg via INTRAVENOUS

## 2017-03-12 MED ORDER — OXYCODONE HCL 5 MG PO TABS
5.0000 mg | ORAL_TABLET | Freq: Four times a day (QID) | ORAL | Status: DC | PRN
  Administered 2017-03-12 – 2017-03-20 (×23): 10 mg via ORAL
  Filled 2017-03-12 (×24): qty 2

## 2017-03-12 MED ORDER — FENTANYL CITRATE (PF) 100 MCG/2ML IJ SOLN
INTRAMUSCULAR | Status: AC
  Administered 2017-03-12: 50 ug via INTRAVENOUS
  Filled 2017-03-12: qty 2

## 2017-03-12 MED ORDER — 0.9 % SODIUM CHLORIDE (POUR BTL) OPTIME
TOPICAL | Status: DC | PRN
  Administered 2017-03-12: 1000 mL

## 2017-03-12 MED ORDER — ONDANSETRON HCL 4 MG/2ML IJ SOLN
INTRAMUSCULAR | Status: DC | PRN
  Administered 2017-03-12: 4 mg via INTRAVENOUS

## 2017-03-12 MED ORDER — SODIUM CHLORIDE 0.9 % IR SOLN
Status: DC | PRN
  Administered 2017-03-12: 3000 mL

## 2017-03-12 MED ORDER — MIDAZOLAM HCL 2 MG/2ML IJ SOLN
INTRAMUSCULAR | Status: AC
  Filled 2017-03-12: qty 2

## 2017-03-12 MED ORDER — FENTANYL CITRATE (PF) 250 MCG/5ML IJ SOLN
INTRAMUSCULAR | Status: AC
  Filled 2017-03-12: qty 5

## 2017-03-12 MED ORDER — PROPOFOL 10 MG/ML IV BOLUS
INTRAVENOUS | Status: AC
  Filled 2017-03-12: qty 20

## 2017-03-12 MED ORDER — METOCLOPRAMIDE HCL 5 MG/ML IJ SOLN
10.0000 mg | Freq: Four times a day (QID) | INTRAMUSCULAR | Status: DC | PRN
  Administered 2017-03-12 – 2017-03-13 (×2): 10 mg via INTRAVENOUS
  Filled 2017-03-12 (×2): qty 2

## 2017-03-12 MED ORDER — LACTATED RINGERS IV SOLN
INTRAVENOUS | Status: DC
Start: 2017-03-12 — End: 2017-03-12

## 2017-03-12 MED ORDER — LIDOCAINE 2% (20 MG/ML) 5 ML SYRINGE
INTRAMUSCULAR | Status: DC | PRN
  Administered 2017-03-12: 80 mg via INTRAVENOUS

## 2017-03-12 MED ORDER — SODIUM CHLORIDE 0.9 % IV SOLN
420.0000 mg | INTRAVENOUS | Status: DC
  Administered 2017-03-14 – 2017-03-22 (×5): 420 mg via INTRAVENOUS
  Filled 2017-03-12 (×6): qty 8.4

## 2017-03-12 MED ORDER — FENTANYL CITRATE (PF) 100 MCG/2ML IJ SOLN
INTRAMUSCULAR | Status: DC | PRN
  Administered 2017-03-12: 50 ug via INTRAVENOUS

## 2017-03-12 MED ORDER — SODIUM CHLORIDE 0.9 % IV SOLN
INTRAVENOUS | Status: DC
  Administered 2017-03-12: 15:00:00 via INTRAVENOUS

## 2017-03-12 MED ORDER — ONDANSETRON HCL 4 MG/2ML IJ SOLN: 4.0000 mg | Freq: Once | INTRAMUSCULAR | Status: DC | PRN

## 2017-03-12 MED ORDER — PROPOFOL 10 MG/ML IV BOLUS
INTRAVENOUS | Status: DC | PRN
Start: 2017-03-12 — End: 2017-03-12
  Administered 2017-03-12: 150 mg via INTRAVENOUS

## 2017-03-12 MED ORDER — MIDAZOLAM HCL 5 MG/5ML IJ SOLN
INTRAMUSCULAR | Status: DC | PRN
  Administered 2017-03-12: 1 mg via INTRAVENOUS

## 2017-03-12 MED ORDER — LIDOCAINE 2% (20 MG/ML) 5 ML SYRINGE
INTRAMUSCULAR | Status: AC
  Filled 2017-03-12: qty 5

## 2017-03-12 MED ORDER — FENTANYL CITRATE (PF) 100 MCG/2ML IJ SOLN
25.0000 ug | INTRAMUSCULAR | Status: DC | PRN
  Administered 2017-03-12 (×2): 50 ug via INTRAVENOUS

## 2017-03-12 SURGICAL SUPPLY — 43 items
BANDAGE ACE 6X5 VEL STRL LF (GAUZE/BANDAGES/DRESSINGS) ×4 IMPLANT
BNDG COHESIVE 6X5 TAN STRL LF (GAUZE/BANDAGES/DRESSINGS) ×2 IMPLANT
CANISTER WOUND CARE 500ML ATS (WOUND CARE) ×8 IMPLANT
COVER SURGICAL LIGHT HANDLE (MISCELLANEOUS) ×2 IMPLANT
CUFF TOURNIQUET SINGLE 34IN LL (TOURNIQUET CUFF) ×2 IMPLANT
CUFF TOURNIQUET SINGLE 44IN (TOURNIQUET CUFF) IMPLANT
DRAPE IMP U-DRAPE 54X76 (DRAPES) ×2 IMPLANT
DRAPE INCISE IOBAN 66X45 STRL (DRAPES) ×8 IMPLANT
DRAPE ORTHO SPLIT 77X108 STRL (DRAPES) ×2
DRAPE SURG ORHT 6 SPLT 77X108 (DRAPES) ×2 IMPLANT
DRSG ADAPTIC 3X8 NADH LF (GAUZE/BANDAGES/DRESSINGS) ×2 IMPLANT
DRSG VAC ATS MED SENSATRAC (GAUZE/BANDAGES/DRESSINGS) ×2 IMPLANT
ELECT CAUTERY BLADE 6.4 (BLADE) ×2 IMPLANT
ELECT REM PT RETURN 9FT ADLT (ELECTROSURGICAL) ×2
ELECTRODE REM PT RTRN 9FT ADLT (ELECTROSURGICAL) ×1 IMPLANT
FACESHIELD WRAPAROUND (MASK) IMPLANT
GLOVE SKINSENSE NS SZ7.5 (GLOVE) ×1
GLOVE SKINSENSE STRL SZ7.5 (GLOVE) ×1 IMPLANT
GLOVE SURG SYN 7.5  E (GLOVE) ×3
GLOVE SURG SYN 7.5 E (GLOVE) ×3 IMPLANT
GOWN STRL REIN XL XLG (GOWN DISPOSABLE) ×2 IMPLANT
HANDPIECE INTERPULSE COAX TIP (DISPOSABLE) ×1
HOOD PEEL AWAY FLYTE STAYCOOL (MISCELLANEOUS) ×4 IMPLANT
KIT BASIN OR (CUSTOM PROCEDURE TRAY) ×2 IMPLANT
KIT ROOM TURNOVER OR (KITS) ×2 IMPLANT
MANIFOLD NEPTUNE II (INSTRUMENTS) ×2 IMPLANT
NS IRRIG 1000ML POUR BTL (IV SOLUTION) ×2 IMPLANT
PACK ORTHO EXTREMITY (CUSTOM PROCEDURE TRAY) ×2 IMPLANT
PACK UNIVERSAL I (CUSTOM PROCEDURE TRAY) ×2 IMPLANT
PAD ARMBOARD 7.5X6 YLW CONV (MISCELLANEOUS) ×4 IMPLANT
SET HNDPC FAN SPRY TIP SCT (DISPOSABLE) ×1 IMPLANT
SPONGE LAP 18X18 X RAY DECT (DISPOSABLE) ×2 IMPLANT
STAPLER VISISTAT 35W (STAPLE) ×2 IMPLANT
STOCKINETTE IMPERVIOUS 9X36 MD (GAUZE/BANDAGES/DRESSINGS) ×2 IMPLANT
SUT ETHILON 2 0 FS 18 (SUTURE) ×4 IMPLANT
SUT MON AB 2-0 CT1 36 (SUTURE) IMPLANT
SWAB CULTURE ESWAB REG 1ML (MISCELLANEOUS) IMPLANT
TOWEL OR 17X24 6PK STRL BLUE (TOWEL DISPOSABLE) ×2 IMPLANT
TOWEL OR 17X26 10 PK STRL BLUE (TOWEL DISPOSABLE) ×2 IMPLANT
TUBE CONNECTING 12X1/4 (SUCTIONS) ×2 IMPLANT
UNDERPAD 30X30 (UNDERPADS AND DIAPERS) ×2 IMPLANT
WATER STERILE IRR 1000ML POUR (IV SOLUTION) IMPLANT
YANKAUER SUCT BULB TIP NO VENT (SUCTIONS) ×2 IMPLANT

## 2017-03-12 NOTE — Anesthesia Preprocedure Evaluation (Addendum)
Anesthesia Evaluation  Patient identified by MRN, date of birth, ID band Patient awake    Reviewed: Allergy & Precautions, H&P , NPO status , Patient's Chart, lab work & pertinent test results  Airway Mallampati: II   Neck ROM: full    Dental  (+) Dental Advisory Given   Pulmonary Current Smoker,    breath sounds clear to auscultation       Cardiovascular hypertension, +CHF  + dysrhythmias + Valvular Problems/Murmurs AI  Rhythm:regular Rate:Normal  TTE + TEE 07/2014 - normal EF, grade 2 diastolic dysfunction, mild-mod AI  Prolonged QTc on EKG   Neuro/Psych PSYCHIATRIC DISORDERS Anxiety Depression  Neuromuscular disease    GI/Hepatic GERD  ,(+) Hepatitis -, C  Endo/Other  diabetes, Type 2  Renal/GU CRFRenal disease     Musculoskeletal  (+) Arthritis ,   Abdominal   Peds  Hematology  (+) anemia ,   Anesthesia Other Findings   Reproductive/Obstetrics                            Anesthesia Physical  Anesthesia Plan  ASA: III  Anesthesia Plan: General   Post-op Pain Management:    Induction: Intravenous  PONV Risk Score and Plan: 3 and Ondansetron, Dexamethasone, Midazolam and Treatment may vary due to age or medical condition  Airway Management Planned: LMA  Additional Equipment: None  Intra-op Plan:   Post-operative Plan:   Informed Consent: I have reviewed the patients History and Physical, chart, labs and discussed the procedure including the risks, benefits and alternatives for the proposed anesthesia with the patient or authorized representative who has indicated his/her understanding and acceptance.   Dental advisory given  Plan Discussed with: CRNA  Anesthesia Plan Comments:         Anesthesia Quick Evaluation

## 2017-03-12 NOTE — Anesthesia Procedure Notes (Signed)
Procedure Name: LMA Insertion Date/Time: 03/12/2017 3:38 PM Performed by: Orlie Dakin Pre-anesthesia Checklist: Patient identified, Emergency Drugs available, Suction available, Patient being monitored and Timeout performed Patient Re-evaluated:Patient Re-evaluated prior to induction Oxygen Delivery Method: Circle system utilized Preoxygenation: Pre-oxygenation with 100% oxygen Induction Type: IV induction Ventilation: Mask ventilation without difficulty LMA: LMA inserted LMA Size: 4.0 Tube type: Oral Number of attempts: 1 Placement Confirmation: CO2 detector and breath sounds checked- equal and bilateral Tube secured with: Tape Dental Injury: Teeth and Oropharynx as per pre-operative assessment

## 2017-03-12 NOTE — Op Note (Signed)
   Date of Surgery: 03/12/2017  INDICATIONS: Brandy Houston is a 61 y.o.-year-old female with a left knee surgical wound dehiscence from her recent knee surgery;  The patient did consent to the procedure after discussion of the risks and benefits.  PREOPERATIVE DIAGNOSIS: Left knee surgical wound dehiscence  POSTOPERATIVE DIAGNOSIS: Same.  PROCEDURE:  1. Irrigation and debridement of left knee wound dehiscence including skin, subcutaneous tissue, muscle 5 x 21 cm (105 cm) 2. Secondary wound closure of dehiscence 3. Application of wound VAC greater than 50 cm  SURGEON: N. Eduard Roux, M.D.  ASSIST: Laure Kidney, RNFA.  ANESTHESIA:  general  IV FLUIDS AND URINE: See anesthesia.  ESTIMATED BLOOD LOSS: 300 mL.  IMPLANTS: None  DRAINS: Wound VAC  COMPLICATIONS: None.  DESCRIPTION OF PROCEDURE: The patient was brought to the operating room and placed supine on the operating table.  The patient had been signed prior to the procedure and this was documented. The patient had the anesthesia placed by the anesthesiologist.  A time-out was performed to confirm that this was the correct patient, site, side and location. The patient did receive antibiotics prior to the incision and was re-dosed during the procedure as needed at indicated intervals.  A tourniquet was placed.  The patient had the operative extremity prepped and draped in the standard surgical fashion.    I began with fully excising the wound dehiscence and a full-thickness fashion. Sharp excisional debridement of the skin was performed using a knife. There was no gross purulence or drainage. I then performed sharp excisional debridement of the subcutaneous tissue and muscle with a Roger within the wound bed. This all measured approximately 105 cm.  After thorough debridement I irrigated the wound with 3 L of normal saline using pulse lavage. Part of the wound was then closed with 2-0 nylon sutures without tension. Given the amount of  swelling and adherence to the deeper layers was not able to fully close the wound. The rest of the wound was closed with a wound VAC. The patient tolerated the procedure well had no immediate complications.  POSTOPERATIVE PLAN: Her open wound presents a complex problem.  Given her platelet dysfunction she is not a candidate for revision total knee replacement.  I am afraid that her only good option at this point is to undergo an above-the-knee amputation. I will have further discussion with the patient regarding this.  Brandy Cecil, MD [place] 7:43 PM

## 2017-03-12 NOTE — H&P (Signed)

## 2017-03-12 NOTE — Progress Notes (Signed)
Received pt upon shift change with report that wound vac was not working properly;clamped and turn off as ordered by ortho MD ;bloody drainage noted on post op dressing site;dressing changed and ice packs applied.Will continue to monitor.

## 2017-03-12 NOTE — Transfer of Care (Signed)
Immediate Anesthesia Transfer of Care Note  Patient: Brandy Houston  Procedure(s) Performed: Procedure(s): IRRIGATION AND DEBRIDEMENT LEFT KNEE WITH WOUND VAC APPLICATION (Left)  Patient Location: PACU  Anesthesia Type:General  Level of Consciousness: awake and patient cooperative  Airway & Oxygen Therapy: Patient Spontanous Breathing and Patient connected to face mask oxygen  Post-op Assessment: Report given to RN and Post -op Vital signs reviewed and stable  Post vital signs: Reviewed and stable  Last Vitals:  Vitals:   03/12/17 0510 03/12/17 1645  BP: (!) 169/68 (!) 169/67  Pulse: 62 77  Resp: 16 14  Temp: 36.6 C 36.6 C  SpO2: 97% 100%    Last Pain:  Vitals:   03/12/17 1645  TempSrc:   PainSc: 1          Complications: No apparent anesthesia complications

## 2017-03-12 NOTE — Progress Notes (Addendum)
Wound vac is not functioning/not having adequate suction. Dr. [name] made aware by OR RN. Dr. [name] states to turn off the wound vac and clamp the tubing. Wound vac turned off and clamped by OR RN.

## 2017-03-12 NOTE — Progress Notes (Signed)
[name], female   DOB: [birth year], 61 y.o.   MRN: [id number]  PROGRESS NOTE    Brandy Houston  XBD:[id number] DOB: 06-23-1955 DOA: 03/10/2017 PCP: [name], MD   Brief Narrative:  61 y.o. female with medical history significant of left knee dehiscence who underwent resection, arthroplasty and placement of antibiotic spacer >4 weeks ago on her left knee and is currently on Iv Daptomycin and oral Cipro per ID recommendations. She was admitted on 03/10/17 as she was found to have exposed subcutaneous tissue with no drainage but with persistent large joint effusion.   Assessment & Plan:   Active Problems:   Wound dehiscence   1. Left knee wound dehiscence.  - Continue Daptomycin iv and oral Cipro (current outpatient regimen as per ID recommendations during prior hospitalization) - Patient is plan for I&D by orthopedics today. Follow further orthopedic recommendations - Continue pain control   2. Hypertension.  - Continue amlodine, hydralazine, lasix and metoprolol for blood pressure control. - Monitor Blood pressure  3.  Chronic kidney disease V.  - Patient euvolemic,  keep negative fluid balance. Continue Lasix - monitor AM labs  4. Type 2 diabetes mellitus. Continue Lantus and sliding scale insulin  5. Depression. - continue Zoloft.  6. Chronic anemia probably anemia of chronic disease from CKD - Hemoglobin stable. Monitor   DVT prophylaxis: heparin  Code Status:  Full Family Communication: none at bedside Disposition Plan: pending clinical outcome  Consultants: Orthopedics  Procedures: None  Antimicrobials: Daptomycin iv 02/08/17>> Oral cipro 02/09/17>>   Subjective: Patient seen and examined at bedside. She had vomiting this morning and is currently still nauseous. No overnight fever. She complains of left knee pain.  Objective: Vitals:   03/11/17 1543 03/11/17 1931 03/12/17 0510 03/12/17 1000  BP: 140/66 (!) 152/63 (!) 169/68   Pulse:  63 61 62   Resp: 18 18 16    Temp: 98.2 F (36.8 C) 98.4 F (36.9 C) 97.9 F (36.6 C)   TempSrc: Oral Oral Oral   SpO2: 97% 97% 97%   Weight:    72.1 kg (158 lb 15.2 oz)    Intake/Output Summary (Last 24 hours) at 03/12/17 1313 Last data filed at 03/12/17 0900  Gross per 24 hour  Intake            588.4 ml  Output                0 ml  Net            588.4 ml   Filed Weights   03/12/17 1000  Weight: 72.1 kg (158 lb 15.2 oz)    Examination:  General exam: Appears In mild distress secondary to nausea and vomiting  Respiratory system: Bilateral decreased breath sound at bases Cardiovascular system: S1 & S2 heard, rate controlled Gastrointestinal system: Abdomen is nondistended, soft and nontender. Normal bowel sounds heard. Extremities: No cyanosis, clubbing, trace edema; Left knee dressing present   Data Reviewed: I have personally reviewed following labs and imaging studies  CBC:  Recent Labs Lab 03/10/17 1919 03/11/17 0522 03/12/17 0346  WBC 8.1 5.9 6.7  NEUTROABS 6.3  --  4.6  HGB 8.5* 7.3* 7.4*  HCT 27.5* 23.7* 24.0*  MCV 86.8 87.1 86.6  PLT 301 263 834   Basic Metabolic Panel:  Recent Labs Lab 03/10/17 1919 03/11/17 0522 03/12/17 0346  NA 134* 136 135  K 4.4 4.2 3.9  CL 107 105 106  CO2 21* 20*  22  GLUCOSE 96 140* 122*  BUN 48* 47* 45*  CREATININE 3.48* 3.46* 3.41*  CALCIUM 8.8* 8.5* 8.7*  MG  --   --  1.9   GFR: Estimated Creatinine Clearance: 16.6 mL/min (A) (by C-G formula based on SCr of 3.41 mg/dL (H)). Liver Function Tests:  Recent Labs Lab 03/10/17 1919 03/11/17 0522  AST 37 20  ALT 40 33  ALKPHOS 169* 164*  BILITOT 0.3 0.7  PROT 8.1 7.1  ALBUMIN 2.9* 2.6*   No results for input(s): LIPASE, AMYLASE in the last 168 hours. No results for input(s): AMMONIA in the last 168 hours. Coagulation Profile: No results for input(s): INR, PROTIME in the last 168 hours. Cardiac Enzymes:  Recent Labs Lab 03/12/17 0346  CKTOTAL 41    BNP (last 3 results) No results for input(s): PROBNP in the last 8760 hours. HbA1C: No results for input(s): HGBA1C in the last 72 hours. CBG:  Recent Labs Lab 03/11/17 1138 03/11/17 1631 03/11/17 2133 03/12/17 0655 03/12/17 1150  GLUCAP 133* 175* 147* 117* 117*   Lipid Profile: No results for input(s): CHOL, HDL, LDLCALC, TRIG, CHOLHDL, LDLDIRECT in the last 72 hours. Thyroid Function Tests: No results for input(s): TSH, T4TOTAL, FREET4, T3FREE, THYROIDAB in the last 72 hours. Anemia Panel: No results for input(s): VITAMINB12, FOLATE, FERRITIN, TIBC, IRON, RETICCTPCT in the last 72 hours. Sepsis Labs: No results for input(s): PROCALCITON, LATICACIDVEN in the last 168 hours.  Recent Results (from the past 240 hour(s))  Surgical pcr screen     Status: None   Collection Time: 03/12/17  3:14 AM  Result Value Ref Range Status   MRSA, PCR NEGATIVE NEGATIVE Final   Staphylococcus aureus NEGATIVE NEGATIVE Final    Comment: (NOTE) The Xpert SA Assay (FDA approved for NASAL specimens in patients 16 years of age and older), is one component of a comprehensive surveillance program. It is not intended to diagnose infection nor to guide or monitor treatment.          Radiology Studies: No results found.      Scheduled Meds: . amLODipine  10 mg Oral QHS  . aspirin EC  81 mg Oral Daily  . calcitRIOL  0.25 mcg Oral Q M,W,F  . ciprofloxacin  250 mg Oral Q breakfast  . ferrous sulfate  325 mg Oral BID WC  . furosemide  80 mg Oral BID  . gabapentin  100 mg Oral BID  . hydrALAZINE  100 mg Oral BID  . insulin aspart  0-9 Units Subcutaneous TID WC  . insulin glargine  15 Units Subcutaneous QAC breakfast  . metoprolol succinate  50 mg Oral BID  . multivitamin with minerals  1 tablet Oral Daily  . nicotine  14 mg Transdermal Daily  . pantoprazole  40 mg Oral Daily  . senna-docusate  2 tablet Oral QHS  . sertraline  100 mg Oral Daily  . sodium chloride flush  3 mL  Intravenous Q12H   Continuous Infusions: . sodium chloride Stopped (03/11/17 0700)  . DAPTOmycin (CUBICIN)  IV Stopped (03/12/17 1113)     LOS: 2 days        Aline August, MD [facility] Pager [phone]  If 7PM-7AM, please contact night-coverage [email or web address] Password [email or web address] 03/12/2017, 1:13 PM

## 2017-03-13 ENCOUNTER — Encounter (HOSPITAL_COMMUNITY): Payer: Self-pay | Admitting: Orthopaedic Surgery

## 2017-03-13 LAB — CBC WITH DIFFERENTIAL/PLATELET
Basophils Absolute: 0 10*3/uL (ref 0.0–0.1)
Basophils Relative: 0 %
Eosinophils Absolute: 0 10*3/uL (ref 0.0–0.7)
Eosinophils Relative: 0 %
HCT: 22.8 % — ABNORMAL LOW (ref 36.0–46.0)
Hemoglobin: 7.2 g/dL — ABNORMAL LOW (ref 12.0–15.0)
Lymphocytes Relative: 18 %
Lymphs Abs: 1.6 10*3/uL (ref 0.7–4.0)
MCH: 27.4 pg (ref 26.0–34.0)
MCHC: 31.6 g/dL (ref 30.0–36.0)
MCV: 86.7 fL (ref 78.0–100.0)
Monocytes Absolute: 0.9 10*3/uL (ref 0.1–1.0)
Monocytes Relative: 9 %
Neutro Abs: 6.7 10*3/uL (ref 1.7–7.7)
Neutrophils Relative %: 73 %
Platelets: 311 10*3/uL (ref 150–400)
RBC: 2.63 MIL/uL — ABNORMAL LOW (ref 3.87–5.11)
RDW: 16.7 % — ABNORMAL HIGH (ref 11.5–15.5)
WBC: 9.3 10*3/uL (ref 4.0–10.5)

## 2017-03-13 LAB — GLUCOSE, CAPILLARY
Glucose-Capillary: 137 mg/dL — ABNORMAL HIGH (ref 65–99)
Glucose-Capillary: 164 mg/dL — ABNORMAL HIGH (ref 65–99)
Glucose-Capillary: 153 mg/dL — ABNORMAL HIGH (ref 65–99)
Glucose-Capillary: 163 mg/dL — ABNORMAL HIGH (ref 65–99)

## 2017-03-13 LAB — BASIC METABOLIC PANEL
Anion gap: 9 (ref 5–15)
BUN: 45 mg/dL — ABNORMAL HIGH (ref 6–20)
CO2: 21 mmol/L — ABNORMAL LOW (ref 22–32)
Calcium: 8.3 mg/dL — ABNORMAL LOW (ref 8.9–10.3)
Chloride: 105 mmol/L (ref 101–111)
Creatinine, Ser: 3.55 mg/dL — ABNORMAL HIGH (ref 0.44–1.00)
GFR calc Af Amer: 15 mL/min — ABNORMAL LOW (ref >60)
GFR calc non Af Amer: 13 mL/min — ABNORMAL LOW (ref >60)
Glucose, Bld: 153 mg/dL — ABNORMAL HIGH (ref 65–99)
Potassium: 3.9 mmol/L (ref 3.5–5.1)
Sodium: 135 mmol/L (ref 135–145)

## 2017-03-13 LAB — MAGNESIUM: Magnesium: 1.9 mg/dL (ref 1.7–2.4)

## 2017-03-13 NOTE — Progress Notes (Signed)
Pharmacy Antibiotic Note  Brandy Houston is a 61 y.o. 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 Pt is readmitted today with non-healing wound now s/p I&D on 9/28 with possible plan for AKA. Plan is to continue current antibiotic regimen. Pharmacy is consulted to manage antibiotics dosing and monitoring. Noted she was discharged on daptomycin 420 mg IV Q 48 hrs, most recent dose was 9/24 AM per patient. She was also on cipro po 250 mg daily.   Pt with CKD, scr 3.55, stable from previous admission, est. crcl ~ 15-20 ml/min. Pt is afebrile, wbc 9.3K. Most recent CK = 41.  Plan: - Continue Daptomycin 420 mg IV Q 48 hrs - Continue Cipro 250 mg PO daily - Will follow along for surgery plans - Will follow weekly CK - next Thursday, Oct 4th   Weight: 158 lb 15.2 oz (72.1 kg)  Temp (24hrs), Avg:98 F (36.7 C), Min:97.9 F (36.6 C), Max:98.3 F (36.8 C)   Recent Labs Lab 03/10/17 1919 03/11/17 0522 03/12/17 0346 03/13/17 0817  WBC 8.1 5.9 6.7 9.3  CREATININE 3.48* 3.46* 3.41* 3.55*    Estimated Creatinine Clearance: 16 mL/min (A) (by C-G formula based on SCr of 3.55 mg/dL (H)).    Allergies  Allergen Reactions  . Compazine [Prochlorperazine] Shortness Of Breath and Swelling    TONGUE SWELLS  . Shellfish-Derived Products Anaphylaxis  . Iodinated Diagnostic Agents Hives and Rash  . Omnipaque [Iohexol] Hives    Antimicrobials this admission: Cubicin 8/26 >> 8/24 Vanc >> 8/25 Cipro 8/27 >>  8/26 CK = 77 9/3 CK = 38 927 CK = 41   Microbiology results: 8/21 left knee aspirate - negative 8/24 left knee tissue - rare bacteroides oralis (beta lactamase +) - may be contaminant per ID, to f/u this week 8/25 BCx - NF 8/26 UCx - > 100K yeast  Thank you for allowing pharmacy to be a part of this patient's care.  Sloan Leiter, PharmD, BCPS Clinical Pharmacist Clinical phone 03/13/2017  until 4PM [phone] After hours, please call #[phone] 03/13/2017 10:18 AM

## 2017-03-13 NOTE — Progress Notes (Signed)
   Subjective:  Patient reports pain as moderate.    Objective:   VITALS:   Vitals:   03/12/17 1715 03/12/17 1736 03/12/17 2136 03/13/17 0342  BP: 139/62 135/66 (!) 139/54 130/60  Pulse: 71 70 70 70  Resp: 12 16 16 16   Temp: 98 F (36.7 C) 98 F (36.7 C) 98.3 F (36.8 C) 97.9 F (36.6 C)  TempSrc:  Oral Oral Oral  SpO2: 97% 96% 94% 96%  Weight:        Neurologically intact Neurovascular intact Sensation intact distally Intact pulses distally Dorsiflexion/Plantar flexion intact No cellulitis present Compartment soft   Lab Results  Component Value Date   WBC 6.7 03/12/2017   HGB 7.4 (L) 03/12/2017   HCT 24.0 (L) 03/12/2017   MCV 86.6 03/12/2017   PLT 267 03/12/2017     Assessment/Plan:  1 Day Post-Op   - Expected postop acute blood loss anemia - will monitor for symptoms - discussed with patient that she has a complex problem with an open wound that has little potential to heal.  At this point, the only good viable option is for AKA.  She will have family meeting today.  If she agrees to AKA, will likely perform this next week.    Eduard Roux 03/13/2017, 7:35 AM [phone]

## 2017-03-13 NOTE — Progress Notes (Signed)
[name], female   DOB: [birth year], 61 y.o.   MRN: [id number]  PROGRESS NOTE    Brandy Houston  VZD:[id number] DOB: 02/16/1956 DOA: 03/10/2017 PCP: [name], MD   Brief Narrative:  61 y.o. female with medical history significant of left knee dehiscence who underwent resection, arthroplasty and placement of antibiotic spacer >4 weeks ago on her left knee and is currently on Iv Daptomycin and oral Cipro per ID recommendations. She was admitted on 03/10/17 as she was found to have exposed subcutaneous tissue with no drainage but with persistent large joint effusion.   Assessment & Plan:   Active Problems:   Wound dehiscence   1. Left knee wound dehiscence.  - Continue Daptomycin iv and oral Cipro (current outpatient regimen as per ID recommendations during prior hospitalization) - Status post I&D by orthopedics on 03/12/2017. Orthopedics is recommending AKA but patient is still undecided. Follow further orthopedic recommendations - Continue pain control  - Continue daptomycin and ciprofloxacin for now as per ID recommendations in the past  2. Hypertension.  - Continue amlodine, hydralazine, lasix and metoprolol for blood pressure control. - Monitor Blood pressure  3.  Chronic kidney disease V.  - Patient euvolemic,  keep negative fluid balance. Continue Lasix - monitor AM labs  4. Type 2 diabetes mellitus. Continue Lantus and sliding scale insulin - Accu-Chek stable  5. Depression. - continue Zoloft.  6. Chronic anemia probably anemia of chronic disease from CKD - Hemoglobin stable. Monitor   DVT prophylaxis: heparin  Code Status:  Full Family Communication: none at bedside Disposition Plan: pending clinical outcome  Consultants: Orthopedics  Procedures: I&D of left knee on 03/12/2017  Antimicrobials: Daptomycin iv 02/08/17>> Oral cipro 02/09/17>>   Subjective: Patient seen and examined at bedside. She is currently not nauseous and has not been  vomiting.. No overnight fever. She complains of left knee pain.  Objective: Vitals:   03/12/17 1736 03/12/17 2136 03/13/17 0342 03/13/17 0956  BP: 135/66 (!) 139/54 130/60 (!) 156/62  Pulse: 70 70 70 60  Resp: 16 16 16    Temp: 98 F (36.7 C) 98.3 F (36.8 C) 97.9 F (36.6 C)   TempSrc: Oral Oral Oral   SpO2: 96% 94% 96% 98%  Weight:        Intake/Output Summary (Last 24 hours) at 03/13/17 1206 Last data filed at 03/12/17 1733  Gross per 24 hour  Intake              475 ml  Output                0 ml  Net              475 ml   Filed Weights   03/12/17 1000  Weight: 72.1 kg (158 lb 15.2 oz)    Examination:  General exam: Alert and awake and in no distress Respiratory system: Bilateral decreased breath sound at bases Cardiovascular system: S1 & S2 heard, rate controlled Gastrointestinal system: Abdomen is nondistended, soft and nontender. Normal bowel sounds heard. Extremities: No cyanosis, clubbing, trace edema; Left knee dressing present   Data Reviewed: I have personally reviewed following labs and imaging studies  CBC:  Recent Labs Lab 03/10/17 1919 03/11/17 0522 03/12/17 0346 03/13/17 0817  WBC 8.1 5.9 6.7 9.3  NEUTROABS 6.3  --  4.6 6.7  HGB 8.5* 7.3* 7.4* 7.2*  HCT 27.5* 23.7* 24.0* 22.8*  MCV 86.8 87.1 86.6 86.7  PLT 301 263 267 311  Basic Metabolic Panel:  Recent Labs Lab 03/10/17 1919 03/11/17 0522 03/12/17 0346 03/13/17 0817  NA 134* 136 135 135  K 4.4 4.2 3.9 3.9  CL 107 105 106 105  CO2 21* 20* 22 21*  GLUCOSE 96 140* 122* 153*  BUN 48* 47* 45* 45*  CREATININE 3.48* 3.46* 3.41* 3.55*  CALCIUM 8.8* 8.5* 8.7* 8.3*  MG  --   --  1.9 1.9   GFR: Estimated Creatinine Clearance: 16 mL/min (A) (by C-G formula based on SCr of 3.55 mg/dL (H)). Liver Function Tests:  Recent Labs Lab 03/10/17 1919 03/11/17 0522  AST 37 20  ALT 40 33  ALKPHOS 169* 164*  BILITOT 0.3 0.7  PROT 8.1 7.1  ALBUMIN 2.9* 2.6*   No results for input(s):  LIPASE, AMYLASE in the last 168 hours. No results for input(s): AMMONIA in the last 168 hours. Coagulation Profile: No results for input(s): INR, PROTIME in the last 168 hours. Cardiac Enzymes:  Recent Labs Lab 03/12/17 0346  CKTOTAL 41   BNP (last 3 results) No results for input(s): PROBNP in the last 8760 hours. HbA1C: No results for input(s): HGBA1C in the last 72 hours. CBG:  Recent Labs Lab 03/12/17 1645 03/12/17 1735 03/12/17 2135 03/13/17 0637 03/13/17 1102  GLUCAP 112* 135* 175* 137* 164*   Lipid Profile: No results for input(s): CHOL, HDL, LDLCALC, TRIG, CHOLHDL, LDLDIRECT in the last 72 hours. Thyroid Function Tests: No results for input(s): TSH, T4TOTAL, FREET4, T3FREE, THYROIDAB in the last 72 hours. Anemia Panel: No results for input(s): VITAMINB12, FOLATE, FERRITIN, TIBC, IRON, RETICCTPCT in the last 72 hours. Sepsis Labs: No results for input(s): PROCALCITON, LATICACIDVEN in the last 168 hours.  Recent Results (from the past 240 hour(s))  Surgical pcr screen     Status: None   Collection Time: 03/12/17  3:14 AM  Result Value Ref Range Status   MRSA, PCR NEGATIVE NEGATIVE Final   Staphylococcus aureus NEGATIVE NEGATIVE Final    Comment: (NOTE) The Xpert SA Assay (FDA approved for NASAL specimens in patients 20 years of age and older), is one component of a comprehensive surveillance program. It is not intended to diagnose infection nor to guide or monitor treatment.          Radiology Studies: No results found.      Scheduled Meds: . amLODipine  10 mg Oral QHS  . aspirin EC  81 mg Oral Daily  . calcitRIOL  0.25 mcg Oral Q M,W,F  . ciprofloxacin  250 mg Oral Q breakfast  . ferrous sulfate  325 mg Oral BID WC  . furosemide  80 mg Oral BID  . gabapentin  100 mg Oral BID  . hydrALAZINE  100 mg Oral BID  . insulin aspart  0-9 Units Subcutaneous TID WC  . insulin glargine  15 Units Subcutaneous QAC breakfast  . metoprolol succinate  50 mg  Oral BID  . multivitamin with minerals  1 tablet Oral Daily  . nicotine  14 mg Transdermal Daily  . pantoprazole  40 mg Oral Daily  . senna-docusate  2 tablet Oral QHS  . sertraline  100 mg Oral Daily  . sodium chloride flush  3 mL Intravenous Q12H   Continuous Infusions: . sodium chloride 0 mL (03/11/17 0700)  . sodium chloride 10 mL/hr at 03/12/17 1502  . [START ON 03/14/2017] DAPTOmycin (CUBICIN)  IV       LOS: 3 days        Aline August, MD [facility]  Pager [phone]  If 7PM-7AM, please contact night-coverage [email or web address] Password [facility] 03/13/2017, 12:06 PM

## 2017-03-13 NOTE — Progress Notes (Signed)
Received call from daughter [name] whom states she's been calling office of Brandy Houston to set up a meeting since she lives out town, or even to request a call back to discuss with her the plan of care. Wants to hear from MD regarding plan of care; plans for AKA that was mentioned to the patient. She states that the family also needs to prepare to come into town for the surgery; they all live out of town or state. Requesting a call from the MD to discuss these things. Left sticky note for physician review. Will inform charge RN of situation as well as oncoming day nurse.

## 2017-03-14 LAB — CBC WITH DIFFERENTIAL/PLATELET
Basophils Absolute: 0 10*3/uL (ref 0.0–0.1)
Basophils Relative: 0 %
Eosinophils Absolute: 0.3 10*3/uL (ref 0.0–0.7)
Eosinophils Relative: 4 %
HCT: 21.2 % — ABNORMAL LOW (ref 36.0–46.0)
Hemoglobin: 6.7 g/dL — CL (ref 12.0–15.0)
Lymphocytes Relative: 18 %
Lymphs Abs: 1.4 10*3/uL (ref 0.7–4.0)
MCH: 27.3 pg (ref 26.0–34.0)
MCHC: 31.6 g/dL (ref 30.0–36.0)
MCV: 86.5 fL (ref 78.0–100.0)
Monocytes Absolute: 0.8 10*3/uL (ref 0.1–1.0)
Monocytes Relative: 11 %
Neutro Abs: 5 10*3/uL (ref 1.7–7.7)
Neutrophils Relative %: 67 %
Platelets: 291 10*3/uL (ref 150–400)
RBC: 2.45 MIL/uL — ABNORMAL LOW (ref 3.87–5.11)
RDW: 16.7 % — ABNORMAL HIGH (ref 11.5–15.5)
WBC: 7.5 10*3/uL (ref 4.0–10.5)

## 2017-03-14 LAB — GLUCOSE, CAPILLARY
Glucose-Capillary: 136 mg/dL — ABNORMAL HIGH (ref 65–99)
Glucose-Capillary: 124 mg/dL — ABNORMAL HIGH (ref 65–99)
Glucose-Capillary: 124 mg/dL — ABNORMAL HIGH (ref 65–99)
Glucose-Capillary: 105 mg/dL — ABNORMAL HIGH (ref 65–99)

## 2017-03-14 LAB — BASIC METABOLIC PANEL
Anion gap: 9 (ref 5–15)
BUN: 46 mg/dL — ABNORMAL HIGH (ref 6–20)
CO2: 21 mmol/L — ABNORMAL LOW (ref 22–32)
Calcium: 8.4 mg/dL — ABNORMAL LOW (ref 8.9–10.3)
Chloride: 105 mmol/L (ref 101–111)
Creatinine, Ser: 3.68 mg/dL — ABNORMAL HIGH (ref 0.44–1.00)
GFR calc Af Amer: 14 mL/min — ABNORMAL LOW (ref >60)
GFR calc non Af Amer: 12 mL/min — ABNORMAL LOW (ref >60)
Glucose, Bld: 145 mg/dL — ABNORMAL HIGH (ref 65–99)
Potassium: 3.8 mmol/L (ref 3.5–5.1)
Sodium: 135 mmol/L (ref 135–145)

## 2017-03-14 LAB — PREPARE RBC (CROSSMATCH)

## 2017-03-14 LAB — MAGNESIUM: Magnesium: 2.1 mg/dL (ref 1.7–2.4)

## 2017-03-14 MED ORDER — POLYETHYLENE GLYCOL 3350 17 G PO PACK: 17.0000 g | PACK | Freq: Every day | ORAL | Status: DC | PRN

## 2017-03-14 NOTE — Clinical Social Work Note (Signed)
CSW acknowledges consult, "discharged from [facility]; went home for a minute then to dr's office; dr sent me here; I'm going home after surgery." CSW will follow for SNF placement if needed.  CSW signing off for now. Consult again if any social work needs arise.  [place]ton Scrape, [place]

## 2017-03-14 NOTE — Progress Notes (Signed)
[name], female   DOB: [birth year], 61 y.o.   MRN: [id number]  PROGRESS NOTE    IDELLE REIMANN  ZHG:[id number] DOB: 1955-10-19 DOA: 03/10/2017 PCP: [name], MD   Brief Narrative:  61 y.o. female with medical history significant of left knee dehiscence who underwent resection, arthroplasty and placement of antibiotic spacer >4 weeks ago on her left knee and is currently on Iv Daptomycin and oral Cipro per ID recommendations. She was admitted on 03/10/17 as she was found to have exposed subcutaneous tissue with no drainage but with persistent large joint effusion. She had I&D on 03/12/2017. Orthopedics following   Assessment & Plan:   Active Problems:   Wound dehiscence   1. Left knee wound dehiscence.  - Continue Daptomycin iv and oral Cipro (current outpatient regimen as per ID recommendations during prior hospitalization) - Status post I&D by orthopedics on 03/12/2017. Orthopedics is recommending AKA but patient is still undecided. Follow further orthopedic recommendations - Continue pain control  - Continue daptomycin and ciprofloxacin for now as per ID recommendations in the past  2. Hypertension.  - Continue amlodine, hydralazine, lasix and metoprolol for blood pressure control. - Monitor Blood pressure  3.  Chronic kidney disease V.  - Patient euvolemic. Continue Lasix - monitor AM labs  4. Type 2 diabetes mellitus. Continue Lantus and sliding scale insulin - Accu-Chek stable  5. Depression. - continue Zoloft.  6. Chronic anemia probably anemia of chronic disease from CKD - Hemoglobin 6.7 today. Transfuse 1 unit packed red cells. Repeat a.m. labs   DVT prophylaxis: heparin  Code Status:  Full Family Communication: none at bedside Disposition Plan: pending clinical outcome  Consultants: Orthopedics  Procedures: I&D of left knee on 03/12/2017  Antimicrobials: Daptomycin iv 02/08/17>> Oral cipro 02/09/17>>   Subjective: Patient seen and  examined at bedside. She still complains of intermittent nausea. Her last bowel movements were 2 days ago. No overnight fever. She complains of left knee pain.  Objective: Vitals:   03/13/17 1801 03/13/17 2007 03/14/17 0431 03/14/17 1315  BP: 129/61 (!) 143/74 134/60 (!) 148/75  Pulse: 63 67 60 73  Resp:  18 18 18   Temp: 97.9 F (36.6 C)  97.8 F (36.6 C) 98.3 F (36.8 C)  TempSrc: Oral  Oral Oral  SpO2: 98% 94% 99% 100%  Weight:        Intake/Output Summary (Last 24 hours) at 03/14/17 1343 Last data filed at 03/14/17 0855  Gross per 24 hour  Intake              230 ml  Output                0 ml  Net              230 ml   Filed Weights   03/12/17 1000  Weight: 72.1 kg (158 lb 15.2 oz)    Examination:  General exam: Alert and awake and in no distress Respiratory system: Bilateral decreased breath sound at bases Cardiovascular system: S1 & S2 heard, rate controlled Gastrointestinal system: Abdomen is nondistended, soft and nontender. Normal bowel sounds heard. Extremities: No cyanosis, clubbing, trace edema; Left knee dressing present   Data Reviewed: I have personally reviewed following labs and imaging studies  CBC:  Recent Labs Lab 03/10/17 1919 03/11/17 0522 03/12/17 0346 03/13/17 0817 03/14/17 0500  WBC 8.1 5.9 6.7 9.3 7.5  NEUTROABS 6.3  --  4.6 6.7 5.0  HGB 8.5* 7.3* 7.4*  7.2* 6.7*  HCT 27.5* 23.7* 24.0* 22.8* 21.2*  MCV 86.8 87.1 86.6 86.7 86.5  PLT 301 263 267 311 782   Basic Metabolic Panel:  Recent Labs Lab 03/10/17 1919 03/11/17 0522 03/12/17 0346 03/13/17 0817 03/14/17 0500  NA 134* 136 135 135 135  K 4.4 4.2 3.9 3.9 3.8  CL 107 105 106 105 105  CO2 21* 20* 22 21* 21*  GLUCOSE 96 140* 122* 153* 145*  BUN 48* 47* 45* 45* 46*  CREATININE 3.48* 3.46* 3.41* 3.55* 3.68*  CALCIUM 8.8* 8.5* 8.7* 8.3* 8.4*  MG  --   --  1.9 1.9 2.1   GFR: Estimated Creatinine Clearance: 15.4 mL/min (A) (by C-G formula based on SCr of 3.68 mg/dL  (H)). Liver Function Tests:  Recent Labs Lab 03/10/17 1919 03/11/17 0522  AST 37 20  ALT 40 33  ALKPHOS 169* 164*  BILITOT 0.3 0.7  PROT 8.1 7.1  ALBUMIN 2.9* 2.6*   No results for input(s): LIPASE, AMYLASE in the last 168 hours. No results for input(s): AMMONIA in the last 168 hours. Coagulation Profile: No results for input(s): INR, PROTIME in the last 168 hours. Cardiac Enzymes:  Recent Labs Lab 03/12/17 0346  CKTOTAL 41   BNP (last 3 results) No results for input(s): PROBNP in the last 8760 hours. HbA1C: No results for input(s): HGBA1C in the last 72 hours. CBG:  Recent Labs Lab 03/13/17 0637 03/13/17 1102 03/13/17 1659 03/13/17 2115 03/14/17 0624  GLUCAP 137* 164* 153* 163* 136*   Lipid Profile: No results for input(s): CHOL, HDL, LDLCALC, TRIG, CHOLHDL, LDLDIRECT in the last 72 hours. Thyroid Function Tests: No results for input(s): TSH, T4TOTAL, FREET4, T3FREE, THYROIDAB in the last 72 hours. Anemia Panel: No results for input(s): VITAMINB12, FOLATE, FERRITIN, TIBC, IRON, RETICCTPCT in the last 72 hours. Sepsis Labs: No results for input(s): PROCALCITON, LATICACIDVEN in the last 168 hours.  Recent Results (from the past 240 hour(s))  Surgical pcr screen     Status: None   Collection Time: 03/12/17  3:14 AM  Result Value Ref Range Status   MRSA, PCR NEGATIVE NEGATIVE Final   Staphylococcus aureus NEGATIVE NEGATIVE Final    Comment: (NOTE) The Xpert SA Assay (FDA approved for NASAL specimens in patients 58 years of age and older), is one component of a comprehensive surveillance program. It is not intended to diagnose infection nor to guide or monitor treatment.          Radiology Studies: No results found.      Scheduled Meds: . amLODipine  10 mg Oral QHS  . aspirin EC  81 mg Oral Daily  . calcitRIOL  0.25 mcg Oral Q M,W,F  . ciprofloxacin  250 mg Oral Q breakfast  . ferrous sulfate  325 mg Oral BID WC  . furosemide  80 mg Oral BID   . gabapentin  100 mg Oral BID  . hydrALAZINE  100 mg Oral BID  . insulin aspart  0-9 Units Subcutaneous TID WC  . insulin glargine  15 Units Subcutaneous QAC breakfast  . metoprolol succinate  50 mg Oral BID  . multivitamin with minerals  1 tablet Oral Daily  . nicotine  14 mg Transdermal Daily  . pantoprazole  40 mg Oral Daily  . senna-docusate  2 tablet Oral QHS  . sertraline  100 mg Oral Daily  . sodium chloride flush  3 mL Intravenous Q12H   Continuous Infusions: . sodium chloride 0 mL (03/11/17 0700)  . sodium  chloride 10 mL/hr at 03/12/17 1502  . DAPTOmycin (CUBICIN)  IV       LOS: 4 days        Aline August, MD [facility] Pager [phone]  If 7PM-7AM, please contact night-coverage [email or web address] Password [email or web address] 03/14/2017, 1:43 PM

## 2017-03-14 NOTE — Progress Notes (Signed)
CRITICAL VALUE ALERT  Critical Value:  Hemoglobin 6.7  Date & Time Notied:  03/14/2017 9:29am  Provider Notified: Dr. Aline August  Orders Received/Actions taken: MD notified

## 2017-03-14 NOTE — Progress Notes (Signed)
Patient is stable today in her room. I briefly spoke with her regarding her wishes for further treatment. She would like to have a family meeting with me tomorrow so that her daughter and son can be present. I will plan on meeting with them tomorrow. If they decide to have the amputation I will likely ask for Dr. Jess Barters assistance with this.

## 2017-03-15 LAB — BASIC METABOLIC PANEL
Anion gap: 9 (ref 5–15)
BUN: 44 mg/dL — ABNORMAL HIGH (ref 6–20)
CO2: 22 mmol/L (ref 22–32)
Calcium: 8.5 mg/dL — ABNORMAL LOW (ref 8.9–10.3)
Chloride: 104 mmol/L (ref 101–111)
Creatinine, Ser: 3.65 mg/dL — ABNORMAL HIGH (ref 0.44–1.00)
GFR calc Af Amer: 14 mL/min — ABNORMAL LOW (ref >60)
GFR calc non Af Amer: 12 mL/min — ABNORMAL LOW (ref >60)
Glucose, Bld: 88 mg/dL (ref 65–99)
Potassium: 3.7 mmol/L (ref 3.5–5.1)
Sodium: 135 mmol/L (ref 135–145)

## 2017-03-15 LAB — BPAM RBC
Blood Product Expiration Date: 201810092359
ISSUE DATE / TIME: 201809291705
Unit Type and Rh: 6200

## 2017-03-15 LAB — TYPE AND SCREEN
ABO/RH(D): A POS
Antibody Screen: NEGATIVE
Unit division: 0

## 2017-03-15 LAB — CBC WITH DIFFERENTIAL/PLATELET
Basophils Absolute: 0 10*3/uL (ref 0.0–0.1)
Basophils Relative: 0 %
Eosinophils Absolute: 0.3 10*3/uL (ref 0.0–0.7)
Eosinophils Relative: 3 %
HCT: 24.2 % — ABNORMAL LOW (ref 36.0–46.0)
Hemoglobin: 7.7 g/dL — ABNORMAL LOW (ref 12.0–15.0)
Lymphocytes Relative: 20 %
Lymphs Abs: 1.5 10*3/uL (ref 0.7–4.0)
MCH: 27.5 pg (ref 26.0–34.0)
MCHC: 31.8 g/dL (ref 30.0–36.0)
MCV: 86.4 fL (ref 78.0–100.0)
Monocytes Absolute: 0.8 10*3/uL (ref 0.1–1.0)
Monocytes Relative: 11 %
Neutro Abs: 4.8 10*3/uL (ref 1.7–7.7)
Neutrophils Relative %: 66 %
Platelets: 273 10*3/uL (ref 150–400)
RBC: 2.8 MIL/uL — ABNORMAL LOW (ref 3.87–5.11)
RDW: 16.4 % — ABNORMAL HIGH (ref 11.5–15.5)
WBC: 7.3 10*3/uL (ref 4.0–10.5)

## 2017-03-15 LAB — GLUCOSE, CAPILLARY
Glucose-Capillary: 76 mg/dL (ref 65–99)
Glucose-Capillary: 104 mg/dL — ABNORMAL HIGH (ref 65–99)
Glucose-Capillary: 92 mg/dL (ref 65–99)
Glucose-Capillary: 111 mg/dL — ABNORMAL HIGH (ref 65–99)

## 2017-03-15 LAB — MAGNESIUM: Magnesium: 2.1 mg/dL (ref 1.7–2.4)

## 2017-03-15 NOTE — Progress Notes (Signed)
I spoke with Brandy Houston this morning regarding her wishes for further treatment on her left leg. She has decided that she will proceed with an above-the-knee amputation this coming week. At this point I will engage Dr. Sharol Given for his expertise in this matter. She is in agreement with this plan.

## 2017-03-15 NOTE — Progress Notes (Signed)
[name], female   DOB: 01-15-1956, 61 y.o.   MRN: [id number]  PROGRESS NOTE    Brandy Houston  [id number]:[id number] DOB: [birth year] DOA: 03/10/2017 PCP: [name], MD   Brief Narrative:  61 y.o. female with medical history significant of left knee dehiscence who underwent resection, arthroplasty and placement of antibiotic spacer >4 weeks ago on her left knee and is currently on Iv Daptomycin and oral Cipro per ID recommendations. She was admitted on 03/10/17 as she was found to have exposed subcutaneous tissue with no drainage but with persistent large joint effusion. She had I&D on 03/12/2017. Orthopedics following   Assessment & Plan:   Active Problems:   Wound dehiscence   1. Left knee wound dehiscence.  - Continue Daptomycin iv and oral Cipro (current outpatient regimen as per ID recommendations during prior hospitalization) - Status post I&D by orthopedics on 03/12/2017. Orthopedics is recommending AKA.  Follow further orthopedic recommendations - Continue pain control  - Continue daptomycin and ciprofloxacin for now as per ID recommendations in the past. Antibiotics can be streamlined after AKA.  2. Hypertension.  - Continue amlodine, hydralazine, lasix and metoprolol  - Monitor Blood pressure  3.  Chronic kidney disease V.  - Patient euvolemic. Continue Lasix - monitor AM labs. Creatinine stable  4. Type 2 diabetes mellitus. Continue Lantus and sliding scale insulin - Accu-Chek stable  5. Depression. - continue Zoloft.  6. Chronic anemia probably anemia of chronic disease from CKD - Hemoglobin 7.7 today. Status post 1 unit packed red cells transfusion yesterday. No need for transfusion today. Repeat a.m. labs   DVT prophylaxis: heparin  Code Status:  Full Family Communication: none at bedside Disposition Plan: pending clinical outcome  Consultants: Orthopedics  Procedures: I&D of left knee on 03/12/2017  Antimicrobials: Daptomycin iv  02/08/17>> Oral cipro 02/09/17>>   Subjective: Patient seen and examined at bedside. No current nausea or vomiting. No overnight fever. She complains of left knee pain.  Objective: Vitals:   03/14/17 1745 03/14/17 1900 03/14/17 2009 03/15/17 0300  BP: (!) 133/59 136/62 (!) 130/57 (!) 155/71  Pulse: 66 69 65 70  Resp: 16 18 18 18   Temp: 98.8 F (37.1 C) 98.8 F (37.1 C) 98.6 F (37 C) 98.4 F (36.9 C)  TempSrc: Oral Oral Oral Oral  SpO2: 98% 100% 99% 96%  Weight:        Intake/Output Summary (Last 24 hours) at 03/15/17 1122 Last data filed at 03/14/17 2243  Gross per 24 hour  Intake              575 ml  Output              500 ml  Net               75 ml   Filed Weights   03/12/17 1000  Weight: 72.1 kg (158 lb 15.2 oz)    Examination:  General exam: No acute distress Respiratory system: Bilateral decreased breath sound at bases Cardiovascular system: S1 & S2 heard, rate controlled Gastrointestinal system: Abdomen is nondistended, soft and nontender. Normal bowel sounds heard. Extremities: No cyanosis, clubbing, trace edema; Left knee dressing present   Data Reviewed: I have personally reviewed following labs and imaging studies  CBC:  Recent Labs Lab 03/10/17 1919 03/11/17 0522 03/12/17 0346 03/13/17 0817 03/14/17 0500 03/15/17 0459  WBC 8.1 5.9 6.7 9.3 7.5 7.3  NEUTROABS 6.3  --  4.6 6.7 5.0 4.8  HGB  8.5* 7.3* 7.4* 7.2* 6.7* 7.7*  HCT 27.5* 23.7* 24.0* 22.8* 21.2* 24.2*  MCV 86.8 87.1 86.6 86.7 86.5 86.4  PLT 301 263 267 311 291 702   Basic Metabolic Panel:  Recent Labs Lab 03/11/17 0522 03/12/17 0346 03/13/17 0817 03/14/17 0500 03/15/17 0459  NA 136 135 135 135 135  K 4.2 3.9 3.9 3.8 3.7  CL 105 106 105 105 104  CO2 20* 22 21* 21* 22  GLUCOSE 140* 122* 153* 145* 88  BUN 47* 45* 45* 46* 44*  CREATININE 3.46* 3.41* 3.55* 3.68* 3.65*  CALCIUM 8.5* 8.7* 8.3* 8.4* 8.5*  MG  --  1.9 1.9 2.1 2.1   GFR: Estimated Creatinine Clearance: 15.5  mL/min (A) (by C-G formula based on SCr of 3.65 mg/dL (H)). Liver Function Tests:  Recent Labs Lab 03/10/17 1919 03/11/17 0522  AST 37 20  ALT 40 33  ALKPHOS 169* 164*  BILITOT 0.3 0.7  PROT 8.1 7.1  ALBUMIN 2.9* 2.6*   No results for input(s): LIPASE, AMYLASE in the last 168 hours. No results for input(s): AMMONIA in the last 168 hours. Coagulation Profile: No results for input(s): INR, PROTIME in the last 168 hours. Cardiac Enzymes:  Recent Labs Lab 03/12/17 0346  CKTOTAL 41   BNP (last 3 results) No results for input(s): PROBNP in the last 8760 hours. HbA1C: No results for input(s): HGBA1C in the last 72 hours. CBG:  Recent Labs Lab 03/14/17 0624 03/14/17 1241 03/14/17 1647 03/14/17 2001 03/15/17 0417  GLUCAP 136* 124* 124* 105* 76   Lipid Profile: No results for input(s): CHOL, HDL, LDLCALC, TRIG, CHOLHDL, LDLDIRECT in the last 72 hours. Thyroid Function Tests: No results for input(s): TSH, T4TOTAL, FREET4, T3FREE, THYROIDAB in the last 72 hours. Anemia Panel: No results for input(s): VITAMINB12, FOLATE, FERRITIN, TIBC, IRON, RETICCTPCT in the last 72 hours. Sepsis Labs: No results for input(s): PROCALCITON, LATICACIDVEN in the last 168 hours.  Recent Results (from the past 240 hour(s))  Surgical pcr screen     Status: None   Collection Time: 03/12/17  3:14 AM  Result Value Ref Range Status   MRSA, PCR NEGATIVE NEGATIVE Final   Staphylococcus aureus NEGATIVE NEGATIVE Final    Comment: (NOTE) The Xpert SA Assay (FDA approved for NASAL specimens in patients 65 years of age and older), is one component of a comprehensive surveillance program. It is not intended to diagnose infection nor to guide or monitor treatment.          Radiology Studies: No results found.      Scheduled Meds: . amLODipine  10 mg Oral QHS  . aspirin EC  81 mg Oral Daily  . calcitRIOL  0.25 mcg Oral Q M,W,F  . ciprofloxacin  250 mg Oral Q breakfast  . ferrous  sulfate  325 mg Oral BID WC  . furosemide  80 mg Oral BID  . gabapentin  100 mg Oral BID  . hydrALAZINE  100 mg Oral BID  . insulin aspart  0-9 Units Subcutaneous TID WC  . insulin glargine  15 Units Subcutaneous QAC breakfast  . metoprolol succinate  50 mg Oral BID  . multivitamin with minerals  1 tablet Oral Daily  . nicotine  14 mg Transdermal Daily  . pantoprazole  40 mg Oral Daily  . senna-docusate  2 tablet Oral QHS  . sertraline  100 mg Oral Daily  . sodium chloride flush  3 mL Intravenous Q12H   Continuous Infusions: . sodium chloride 0 mL (  03/11/17 0700)  . sodium chloride 10 mL/hr at 03/12/17 1502  . DAPTOmycin (CUBICIN)  IV Stopped (03/14/17 1639)     LOS: 5 days        Aline August, MD [facility] Pager [phone]  If 7PM-7AM, please contact night-coverage [email or web address] Password [email or web address] 03/15/2017, 11:22 AM

## 2017-03-16 ENCOUNTER — Other Ambulatory Visit (INDEPENDENT_AMBULATORY_CARE_PROVIDER_SITE_OTHER): Payer: Self-pay | Admitting: Family

## 2017-03-16 DIAGNOSIS — T8130XA Disruption of wound, unspecified, initial encounter: Secondary | ICD-10-CM

## 2017-03-16 LAB — BASIC METABOLIC PANEL
Anion gap: 5 (ref 5–15)
BUN: 47 mg/dL — ABNORMAL HIGH (ref 6–20)
CO2: 23 mmol/L (ref 22–32)
Calcium: 8.3 mg/dL — ABNORMAL LOW (ref 8.9–10.3)
Chloride: 107 mmol/L (ref 101–111)
Creatinine, Ser: 3.66 mg/dL — ABNORMAL HIGH (ref 0.44–1.00)
GFR calc Af Amer: 14 mL/min — ABNORMAL LOW (ref >60)
GFR calc non Af Amer: 12 mL/min — ABNORMAL LOW (ref >60)
Glucose, Bld: 98 mg/dL (ref 65–99)
Potassium: 3.7 mmol/L (ref 3.5–5.1)
Sodium: 135 mmol/L (ref 135–145)

## 2017-03-16 LAB — GLUCOSE, CAPILLARY
Glucose-Capillary: 102 mg/dL — ABNORMAL HIGH (ref 65–99)
Glucose-Capillary: 178 mg/dL — ABNORMAL HIGH (ref 65–99)
Glucose-Capillary: 128 mg/dL — ABNORMAL HIGH (ref 65–99)
Glucose-Capillary: 116 mg/dL — ABNORMAL HIGH (ref 65–99)

## 2017-03-16 LAB — CBC WITH DIFFERENTIAL/PLATELET
Basophils Absolute: 0 10*3/uL (ref 0.0–0.1)
Basophils Relative: 0 %
Eosinophils Absolute: 0.2 10*3/uL (ref 0.0–0.7)
Eosinophils Relative: 3 %
HCT: 24 % — ABNORMAL LOW (ref 36.0–46.0)
Hemoglobin: 7.5 g/dL — ABNORMAL LOW (ref 12.0–15.0)
Lymphocytes Relative: 17 %
Lymphs Abs: 1.2 10*3/uL (ref 0.7–4.0)
MCH: 27.2 pg (ref 26.0–34.0)
MCHC: 31.3 g/dL (ref 30.0–36.0)
MCV: 87 fL (ref 78.0–100.0)
Monocytes Absolute: 0.7 10*3/uL (ref 0.1–1.0)
Monocytes Relative: 10 %
Neutro Abs: 5.1 10*3/uL (ref 1.7–7.7)
Neutrophils Relative %: 70 %
Platelets: 251 10*3/uL (ref 150–400)
RBC: 2.76 MIL/uL — ABNORMAL LOW (ref 3.87–5.11)
RDW: 16.6 % — ABNORMAL HIGH (ref 11.5–15.5)
WBC: 7.2 10*3/uL (ref 4.0–10.5)

## 2017-03-16 LAB — MAGNESIUM: Magnesium: 2 mg/dL (ref 1.7–2.4)

## 2017-03-16 MED ORDER — LORATADINE 10 MG PO TABS: 10.0000 mg | ORAL_TABLET | Freq: Every day | ORAL | Status: DC | PRN

## 2017-03-16 NOTE — Progress Notes (Signed)
[name], female   DOB: [birth year], 61 y.o.   MRN: [id number]  PROGRESS NOTE    Brandy Houston  LYY:[id number] DOB: March 27, 1956 DOA: 03/10/2017 PCP: [name], MD   Brief Narrative:  61 y.o. female with medical history significant of left knee dehiscence who underwent resection, arthroplasty and placement of antibiotic spacer >4 weeks ago on her left knee and is currently on Iv Daptomycin and oral Cipro per ID recommendations. She was admitted on 03/10/17 as she was found to have exposed subcutaneous tissue with no drainage but with persistent large joint effusion. She had I&D on 03/12/2017. Orthopedics following   Assessment & Plan:   Active Problems:   Wound dehiscence   1. Left knee wound dehiscence.  - Continue Daptomycin iv and oral Cipro (current outpatient regimen as per ID recommendations during prior hospitalization) - Status post I&D by orthopedics on 03/12/2017. Orthopedics is recommending AKA.  Follow further orthopedic recommendations and timing of AKA once patient talks to her family members. - Continue pain control  - Continue daptomycin and ciprofloxacin for now as per ID recommendations in the past. Antibiotics can be streamlined after AKA.  2. Hypertension.  - Continue amlodine, hydralazine, lasix and metoprolol  - Monitor Blood pressure  3.  Chronic kidney disease V.  - Patient euvolemic. Continue Lasix - monitor AM labs. Creatinine stable  4. Type 2 diabetes mellitus. Continue Lantus and sliding scale insulin - Accu-Chek stable  5. Depression. - continue Zoloft.  6. Chronic anemia probably anemia of chronic disease from CKD - Hemoglobin 7.5 today. No need for transfusion today. Repeat a.m. labs   DVT prophylaxis: heparin  Code Status:  Full Family Communication: none at bedside Disposition Plan: pending clinical outcome  Consultants: Orthopedics  Procedures: I&D of left knee on 03/12/2017  Antimicrobials: Daptomycin iv  02/08/17>> Oral cipro 02/09/17>>   Subjective: Patient seen and examined at bedside. No current nausea or vomiting. No overnight fever. She complains of left knee pain.no chest pain, shortness of breath.  Objective: Vitals:   03/15/17 0300 03/15/17 1406 03/15/17 1900 03/16/17 0300  BP: (!) 155/71 (!) 145/71 (!) 148/67 140/61  Pulse: 70 66 69 67  Resp: 18 18 18 18   Temp: 98.4 F (36.9 C) 99.2 F (37.3 C) 98.8 F (37.1 C) 98.8 F (37.1 C)  TempSrc: Oral Oral Oral Oral  SpO2: 96% 100% 100% 99%  Weight:        Intake/Output Summary (Last 24 hours) at 03/16/17 0856 Last data filed at 03/15/17 2127  Gross per 24 hour  Intake           742.67 ml  Output                0 ml  Net           742.67 ml   Filed Weights   03/12/17 1000  Weight: 72.1 kg (158 lb 15.2 oz)    Examination:  General exam: No acute distress Respiratory system: lateral decreased breath sounds at bases Cardiovascular system: S1 & S2 heard, rate controlled Gastrointestinal system: Abdomen is nondistended, soft and nontender. Normal bowel sounds heard. Extremities: No cyanosis, clubbing, trace edema; Left knee dressing present   Data Reviewed: I have personally reviewed following labs and imaging studies  CBC:  Recent Labs Lab 03/12/17 0346 03/13/17 0817 03/14/17 0500 03/15/17 0459 03/16/17 0403  WBC 6.7 9.3 7.5 7.3 7.2  NEUTROABS 4.6 6.7 5.0 4.8 5.1  HGB 7.4* 7.2* 6.7* 7.7*  7.5*  HCT 24.0* 22.8* 21.2* 24.2* 24.0*  MCV 86.6 86.7 86.5 86.4 87.0  PLT 267 311 291 273 419   Basic Metabolic Panel:  Recent Labs Lab 03/12/17 0346 03/13/17 0817 03/14/17 0500 03/15/17 0459 03/16/17 0403  NA 135 135 135 135 135  K 3.9 3.9 3.8 3.7 3.7  CL 106 105 105 104 107  CO2 22 21* 21* 22 23  GLUCOSE 122* 153* 145* 88 98  BUN 45* 45* 46* 44* 47*  CREATININE 3.41* 3.55* 3.68* 3.65* 3.66*  CALCIUM 8.7* 8.3* 8.4* 8.5* 8.3*  MG 1.9 1.9 2.1 2.1 2.0   GFR: Estimated Creatinine Clearance: 15.5 mL/min (A) (by  C-G formula based on SCr of 3.66 mg/dL (H)). Liver Function Tests:  Recent Labs Lab 03/10/17 1919 03/11/17 0522  AST 37 20  ALT 40 33  ALKPHOS 169* 164*  BILITOT 0.3 0.7  PROT 8.1 7.1  ALBUMIN 2.9* 2.6*   No results for input(s): LIPASE, AMYLASE in the last 168 hours. No results for input(s): AMMONIA in the last 168 hours. Coagulation Profile: No results for input(s): INR, PROTIME in the last 168 hours. Cardiac Enzymes:  Recent Labs Lab 03/12/17 0346  CKTOTAL 41   BNP (last 3 results) No results for input(s): PROBNP in the last 8760 hours. HbA1C: No results for input(s): HGBA1C in the last 72 hours. CBG:  Recent Labs Lab 03/15/17 0417 03/15/17 1155 03/15/17 1718 03/15/17 2053 03/16/17 0623  GLUCAP 76 104* 92 111* 102*   Lipid Profile: No results for input(s): CHOL, HDL, LDLCALC, TRIG, CHOLHDL, LDLDIRECT in the last 72 hours. Thyroid Function Tests: No results for input(s): TSH, T4TOTAL, FREET4, T3FREE, THYROIDAB in the last 72 hours. Anemia Panel: No results for input(s): VITAMINB12, FOLATE, FERRITIN, TIBC, IRON, RETICCTPCT in the last 72 hours. Sepsis Labs: No results for input(s): PROCALCITON, LATICACIDVEN in the last 168 hours.  Recent Results (from the past 240 hour(s))  Surgical pcr screen     Status: None   Collection Time: 03/12/17  3:14 AM  Result Value Ref Range Status   MRSA, PCR NEGATIVE NEGATIVE Final   Staphylococcus aureus NEGATIVE NEGATIVE Final    Comment: (NOTE) The Xpert SA Assay (FDA approved for NASAL specimens in patients 32 years of age and older), is one component of a comprehensive surveillance program. It is not intended to diagnose infection nor to guide or monitor treatment.          Radiology Studies: No results found.      Scheduled Meds: . amLODipine  10 mg Oral QHS  . aspirin EC  81 mg Oral Daily  . calcitRIOL  0.25 mcg Oral Q M,W,F  . ciprofloxacin  250 mg Oral Q breakfast  . ferrous sulfate  325 mg Oral  BID WC  . furosemide  80 mg Oral BID  . gabapentin  100 mg Oral BID  . hydrALAZINE  100 mg Oral BID  . insulin aspart  0-9 Units Subcutaneous TID WC  . insulin glargine  15 Units Subcutaneous QAC breakfast  . metoprolol succinate  50 mg Oral BID  . multivitamin with minerals  1 tablet Oral Daily  . nicotine  14 mg Transdermal Daily  . pantoprazole  40 mg Oral Daily  . senna-docusate  2 tablet Oral QHS  . sertraline  100 mg Oral Daily  . sodium chloride flush  3 mL Intravenous Q12H   Continuous Infusions: . sodium chloride 0 mL (03/11/17 0700)  . sodium chloride 10 mL/hr at 03/12/17  1502  . DAPTOmycin (CUBICIN)  IV Stopped (03/14/17 1639)     LOS: 6 days        Aline August, MD [facility] Pager [phone]  If 7PM-7AM, please contact night-coverage [email or web address] Password [email or web address] 03/16/2017, 8:56 AM

## 2017-03-16 NOTE — Care Management Important Message (Signed)
Important Message  Patient Details  Name: Brandy Houston MRN: [id number] Date of Birth: Apr 03, 1956   Medicare Important Message Given:  Yes    [name] 03/16/2017, 1:52 PM

## 2017-03-16 NOTE — Plan of Care (Signed)
Problem: Skin Integrity: Goal: Risk for impaired skin integrity will decrease Outcome: Progressing VSS. Woud vac CDI. Pain is managed. WBC WNL.

## 2017-03-16 NOTE — Care Management Note (Signed)
Case Management Note  Patient Details  Name: AMYRIAH BURAS MRN: [id number] Date of Birth: [birth year]  Subjective/Objective:    61 yr old female s/p wound dehesicence of left total knee, patient to have amputation of left leg this week.                Action/Plan:  Case manager spoke with patient concerning discharge plan. Patient states she will not go to SNF, will go home with family assistance. Patient says she has RW and 3in1 at Home. Would like [facility] for therapy. CM will call referral to Christa See, [facility].    Expected Discharge Date:    pending             Expected Discharge Plan:  [place]  In-House Referral:  NA  Discharge planning Services  CM Consult  Post Acute Care Choice:  Home Health Choice offered to:  Patient  DME Arranged:   (Has RW and 3in1) DME Agency:  NA  HH Arranged:  PT, OT HH Agency:  [facility] (formerly [employer])  Status of Service:  In process, will continue to follow  If discussed at Long Length of Stay Meetings, dates discussed:    Additional Comments:  Ninfa Meeker, RN 03/16/2017, 4:05 PM

## 2017-03-16 NOTE — Consult Note (Signed)
ORTHOPAEDIC CONSULTATION  REQUESTING PHYSICIAN: Aline August, MD  Chief Complaint: dehiscence wound  HPI: Brandy Houston is a 61 y.o. female who presents with left knee wound dehiscence status post surgical intervention for infected total knee arthroplasty.  Past Medical History:  Diagnosis Date  . Anemia   . Anxiety   . Arthritis    "in my joints" (03/10/2017)  . Chronic diastolic CHF (congestive heart failure) ([place])   . CKD (chronic kidney disease), stage IV ([place])    stage IV. previous HD, none currently 10/30/15 (confirmed 02/05/2017 & 03/10/2017)  . Depression    Chronic  . Diabetic peripheral neuropathy ([place])   . Dysrhythmia    tachycardia, normal ECHO 08-09-14  . GERD (gastroesophageal reflux disease)   . Heart murmur     dx'd 02/05/2017  . Hepatitis C    "tx'd in 2016; I'm negative now" (02/05/2017)  . History of blood transfusion 2017   "w/knee replacement"  . Hypertension   . Osteoarthritis of left knee   . Peripheral neuropathy   . Protein calorie malnutrition ([place])   . Septic arthritis ([place])   . Slow transit constipation   . Type II diabetes mellitus ([name])    IDDM  . Uncontrolled hypertension 02/18/2013  . Unsteady gait    Past Surgical History:  Procedure Laterality Date  . APPENDECTOMY    . AV FISTULA PLACEMENT Left 09/13/2014   Procedure: Brachial Artery to Brachial Vein Gortex Four - Seven Stretch GRAFT INSERTION Left Forearm;  Surgeon: Mal Misty, MD;  Location: [place];  Service: Vascular;  Laterality: Left;  . CHOLECYSTECTOMY OPEN    . COLON SURGERY    . EXCISIONAL TOTAL KNEE ARTHROPLASTY WITH ANTIBIOTIC SPACERS Left 02/06/2017   Procedure: Incisional total iknee with antibiotic spacer ;  Surgeon: Leandrew Koyanagi, MD;  Location: [place];  Service: Orthopedics;  Laterality: Left;  . IR FLUORO GUIDE CV LINE RIGHT  02/10/2017  . IR US GUIDE VASC ACCESS RIGHT  02/10/2017  . IRRIGATION AND DEBRIDEMENT KNEE Left 03/12/2017   Procedure: IRRIGATION AND  DEBRIDEMENT LEFT KNEE WITH WOUND VAC APPLICATION;  Surgeon: Leandrew Koyanagi, MD;  Location: [place];  Service: Orthopedics;  Laterality: Left;  . JOINT REPLACEMENT    . KNEE ARTHROSCOPY Right 08/10/2014   Procedure: ARTHROSCOPY I & D KNEE;  Surgeon: Marianna Payment, MD;  Location: [facility];  Service: Orthopedics;  Laterality: Right;  . KNEE ARTHROSCOPY Left 08/11/2014   Procedure: ARTHROSCOPIC WASHOUT LEFT KNEE;  Surgeon: Marianna Payment, MD;  Location: [place];  Service: Orthopedics;  Laterality: Left;  . KNEE ARTHROSCOPY Left 08/19/2014   Procedure: ARTHROSCOPIC WASHOUT LEFT KNEE;  Surgeon: Leandrew Koyanagi, MD;  Location: [place];  Service: Orthopedics;  Laterality: Left;  . KNEE ARTHROSCOPY WITH LATERAL MENISECTOMY Left 04/04/2015   Procedure: AND PARTIAL LATERAL MENISECTOMY;  Surgeon: Leandrew Koyanagi, MD;  Location: [place];  Service: Orthopedics;  Laterality: Left;  . KNEE ARTHROSCOPY WITH MEDIAL MENISECTOMY Left 04/04/2015   Procedure: LEFT KNEE ARTHROSCOPY WITH PARTIAL MEDIAL MENISCECTOMY  AND SYNOVECTOMY;  Surgeon: Leandrew Koyanagi, MD;  Location: [place];  Service: Orthopedics;  Laterality: Left;  . SHOULDER ARTHROSCOPY Bilateral 08/10/2014   Procedure: I & D BILATERAL SHOULDERS ;  Surgeon: Marianna Payment, MD;  Location: [facility];  Service: Orthopedics;  Laterality: Bilateral;  . SMALL INTESTINE SURGERY     Due to Small Bowel Obstruction; "fixed it when they did my  gallbladder OR"  . TEE WITHOUT CARDIOVERSION N/A 08/14/2014   Procedure: TRANSESOPHAGEAL ECHOCARDIOGRAM (TEE);  Surgeon: Thayer Headings, MD;  Location: [place];  Service: Cardiovascular;  Laterality: N/A;  . TENOSYNOVECTOMY Right 08/11/2014   Procedure: RIGHT WRIST IRRIGATION AND DEBRIDEMENT, TENOSYNOVECTOMY;  Surgeon: Marianna Payment, MD;  Location: [place];  Service: Orthopedics;  Laterality: Right;  . TOTAL KNEE ARTHROPLASTY Left 11/07/2015   Procedure: LEFT TOTAL KNEE ARTHROPLASTY WITH REVISION OF  IMPLANTS;  Surgeon: Leandrew Koyanagi, MD;  Location: [place];  Service: Orthopedics;  Laterality: Left;  . TUBAL LIGATION     Social History   Social History  . Marital status: Single    Spouse name: N/A  . Number of children: N/A  . Years of education: N/A   Social History Main Topics  . Smoking status: Current Every Day Smoker    Packs/day: 0.50    Years: 35.00    Types: Cigarettes  . Smokeless tobacco: Never Used  . Alcohol use 0.0 oz/week     Comment: 03/10/2017 "I drink a wine cooler a few times/year"  . Drug use: No  . Sexual activity: No   Other Topics Concern  . None   Social History Narrative  . None   Family History  Problem Relation Age of Onset  . Cancer Mother   . Heart disease Father   . Cancer Sister   . Hypertension Sister   . Cancer Brother   . Diabetes Brother   . Hypertension Brother    - negative except otherwise stated in the family history section Allergies  Allergen Reactions  . Compazine [Prochlorperazine] Shortness Of Breath and Swelling    TONGUE SWELLS  . Shellfish-Derived Products Anaphylaxis  . Iodinated Diagnostic Agents Hives and Rash  . Omnipaque [Iohexol] Hives   Prior to Admission medications   Medication Sig Start Date End Date Taking? Authorizing Provider  acetaminophen (TYLENOL) 500 MG tablet Take 1,000 mg by mouth every 6 (six) hours as needed for mild pain.   Yes [provider]  amLODipine (NORVASC) 10 MG tablet Take 10 mg by mouth at bedtime.    Yes [provider]  aspirin EC 81 MG tablet Take 81 mg by mouth daily.   Yes [provider]  calcitRIOL (ROCALTROL) 0.25 MCG capsule Take 0.25 mcg by mouth every Monday, Wednesday, and Friday.   Yes [provider]  calcium carbonate (TUMS EX) 750 MG chewable tablet Chew 1 tablet by mouth 3 (three) times daily as needed for heartburn.    Yes [provider]  ciprofloxacin (CIPRO) 250 MG tablet Take 1 tablet (250 mg total) by mouth daily.  02/18/17 03/25/17 Yes Elodia Florence., MD  daptomycin (CUBICIN) IVPB Inject 420 mg into the vein every other day. Indication:  Left prosthetic knee infection Last Day of Therapy:  03/20/17 Labs - Once weekly:  CBC/D, BMP, and CPK Labs - Every other week:  ESR and CRP 02/18/17 03/25/17 Yes Elodia Florence., MD  Diclofenac Sodium 2 % SOLN Place 2 g onto the skin 2 (two) times daily as needed. Patient taking differently: Place 2 g onto the skin 2 (two) times daily as needed (for arthritis).  08/19/16  Yes Leandrew Koyanagi, MD  diphenhydrAMINE (BENADRYL) 25 mg capsule Take 1 capsule (25 mg total) by mouth every 6 (six) hours as needed for itching. 09/28/14  Yes Love, Ivan Anchors, PA-C  ferrous sulfate 325 (65 FE) MG tablet Take 325 mg by mouth  2 (two) times daily with a meal.   Yes [provider]  furosemide (LASIX) 80 MG tablet Take 1 tablet (80 mg total) by mouth 2 (two) times daily. 02/18/17 03/20/17 Yes Elodia Florence., MD  gabapentin (NEURONTIN) 100 MG capsule Take 1 capsule (100 mg total) by mouth 2 (two) times daily. 12/06/14  Yes Lance Bosch, NP  hydrALAZINE (APRESOLINE) 100 MG tablet Take 100 mg by mouth 2 (two) times daily.  10/17/15  Yes [provider]  insulin glargine (LANTUS) 100 UNIT/ML injection Inject 15 Units into the skin daily before breakfast.    Yes [provider]  methocarbamol (ROBAXIN) 500 MG tablet TAKE 1 TABLET BY MOUTH EVERY 6 HOURS AS NEEDED FOR MUSCLE SPASM Patient taking differently: TAKE 500 MG BY MOUTH EVERY 6 HOURS AS NEEDED FOR MUSCLE SPASM 01/19/17  Yes Leandrew Koyanagi, MD  metoprolol succinate (TOPROL-XL) 50 MG 24 hr tablet Take 50 mg by mouth 2 (two) times daily.  10/17/15  Yes [provider]  Multiple Vitamin (MULTIVITAMIN WITH MINERALS) TABS tablet Take 1 tablet by mouth daily.   Yes [provider]  nicotine (NICODERM CQ - DOSED IN MG/24 HOURS) 14 mg/24hr patch Place 1 patch (14 mg total) onto the skin daily. 02/19/17   Yes Elodia Florence., MD  omeprazole (PRILOSEC) 20 MG capsule Take 20 mg by mouth daily before breakfast.  10/04/15  Yes [provider]  oxyCODONE (OXY IR/ROXICODONE) 5 MG immediate release tablet Take 5-10 mg by mouth every 6 (six) hours as needed for severe pain.   Yes [provider]  Potassium Chloride ER 20 MEQ TBCR Take 20 mEq by mouth 2 (two) times daily.  10/04/15  Yes [provider]  sennosides-docusate sodium (SENOKOT-S) 8.6-50 MG tablet Take 2 tablets by mouth at bedtime.   Yes [provider]  sertraline (ZOLOFT) 100 MG tablet Take 1 tablet (100 mg total) by mouth daily. 03/15/15  Yes Carlyle Basques, MD  traMADol (ULTRAM) 50 MG tablet Take 1-2 tablets (50-100 mg total) by mouth every 6 (six) hours as needed. Patient taking differently: Take 50-100 mg by mouth every 6 (six) hours as needed for moderate pain or severe pain.  02/03/17  Yes Leandrew Koyanagi, MD  Blood Glucose Monitoring Suppl (ACCU-CHEK AVIVA PLUS) W/DEVICE KIT Check sugars TID for E11.65 Patient not taking: Reported on 03/11/2017 01/29/15   Lance Bosch, NP  Darbepoetin Alfa (ARANESP) 100 MCG/0.5ML SOSY injection Inject 100 mcg into the skin every 30 (thirty) days.    [provider]  enoxaparin (LOVENOX) 30 MG/0.3ML injection Inject 0.3 mLs (30 mg total) into the skin daily. Patient not taking: Reported on 03/12/2017 02/19/17 03/05/17  Elodia Florence., MD  glucose blood (ACCU-CHEK AVIVA) test strip Check sugars TID for E11.65 Patient not taking: Reported on 03/11/2017 01/29/15   Lance Bosch, NP  Lancet Devices [facility]) lancets Check sugars TID for E11.65 Patient not taking: Reported on 03/11/2017 01/29/15   Lance Bosch, NP   No results found. - pertinent xrays, CT, MRI studies were reviewed and independently interpreted  Positive ROS: All other systems have been reviewed and were otherwise negative with the exception of those mentioned in the HPI and as  above.  Physical Exam: General: Alert, no acute distress Psychiatric: Patient is competent for consent with normal mood and affect Lymphatic: No axillary or cervical lymphadenopathy Cardiovascular: No pedal edema Respiratory: No cyanosis, no use of accessory musculature GI:  No organomegaly, abdomen is soft and non-tender  Skin:   Examination patient has dehiscence of the incision leftknee. Neurologic: Patient does not have protective sensation bilateral lower extremities.   MUSCULOSKELETAL:  Patient's lower extremities are neurovascularly intact no purulent drainage  Assessment: Assessment wound dehiscence status post revision for infected total knee arthroplasty.  Plan: Plan: Patient's best option at this time would be an above-the-knee amputation. Patient does not have viable soft tissue options for limb salvage. Patient states that she will be talking with her family tomorrow could possibly proceed with surgery on Wednesday.  Thank you for the consult and the opportunity to see Ms. Keith Rake, [place] [phone] 6:44 AM

## 2017-03-17 ENCOUNTER — Telehealth (INDEPENDENT_AMBULATORY_CARE_PROVIDER_SITE_OTHER): Payer: Self-pay | Admitting: Orthopaedic Surgery

## 2017-03-17 DIAGNOSIS — F329 Major depressive disorder, single episode, unspecified: Secondary | ICD-10-CM

## 2017-03-17 LAB — CBC WITH DIFFERENTIAL/PLATELET
Basophils Absolute: 0 10*3/uL (ref 0.0–0.1)
Basophils Relative: 0 %
Eosinophils Absolute: 0.2 10*3/uL (ref 0.0–0.7)
Eosinophils Relative: 3 %
HCT: 22.5 % — ABNORMAL LOW (ref 36.0–46.0)
Hemoglobin: 7 g/dL — ABNORMAL LOW (ref 12.0–15.0)
Lymphocytes Relative: 16 %
Lymphs Abs: 1 10*3/uL (ref 0.7–4.0)
MCH: 26.6 pg (ref 26.0–34.0)
MCHC: 30.7 g/dL (ref 30.0–36.0)
MCV: 86.9 fL (ref 78.0–100.0)
Monocytes Absolute: 0.8 10*3/uL (ref 0.1–1.0)
Monocytes Relative: 12 %
Neutro Abs: 4.6 10*3/uL (ref 1.7–7.7)
Neutrophils Relative %: 69 %
Platelets: 237 10*3/uL (ref 150–400)
RBC: 2.59 MIL/uL — ABNORMAL LOW (ref 3.87–5.11)
RDW: 16.5 % — ABNORMAL HIGH (ref 11.5–15.5)
WBC: 6.7 10*3/uL (ref 4.0–10.5)

## 2017-03-17 LAB — GLUCOSE, CAPILLARY
Glucose-Capillary: 118 mg/dL — ABNORMAL HIGH (ref 65–99)
Glucose-Capillary: 174 mg/dL — ABNORMAL HIGH (ref 65–99)
Glucose-Capillary: 142 mg/dL — ABNORMAL HIGH (ref 65–99)

## 2017-03-17 LAB — BASIC METABOLIC PANEL
Anion gap: 7 (ref 5–15)
BUN: 52 mg/dL — ABNORMAL HIGH (ref 6–20)
CO2: 22 mmol/L (ref 22–32)
Calcium: 8.5 mg/dL — ABNORMAL LOW (ref 8.9–10.3)
Chloride: 107 mmol/L (ref 101–111)
Creatinine, Ser: 3.71 mg/dL — ABNORMAL HIGH (ref 0.44–1.00)
GFR calc Af Amer: 14 mL/min — ABNORMAL LOW (ref >60)
GFR calc non Af Amer: 12 mL/min — ABNORMAL LOW (ref >60)
Glucose, Bld: 115 mg/dL — ABNORMAL HIGH (ref 65–99)
Potassium: 3.7 mmol/L (ref 3.5–5.1)
Sodium: 136 mmol/L (ref 135–145)

## 2017-03-17 LAB — MAGNESIUM: Magnesium: 2 mg/dL (ref 1.7–2.4)

## 2017-03-17 NOTE — Telephone Encounter (Signed)
Patient was wanting to set up a meeting with Dr. Ninfa Linden so he can let her family know what's going on. CB # [phone]

## 2017-03-17 NOTE — Progress Notes (Signed)
[name], female   DOB: 09/24/1955, 61 y.o.   MRN: [id number]  PROGRESS NOTE    Brandy Houston  [id number]:[id number] DOB: Feb 26, 1956 DOA: 03/10/2017 PCP: Brandy Arnt, MD   Brief Narrative:  61 y.o. female with medical history significant of left knee dehiscence who underwent resection, arthroplasty and placement of antibiotic spacer >4 weeks ago on her left knee and is currently on Iv Daptomycin and oral Cipro per ID recommendations. She was admitted on 03/10/17 as she was found to have exposed subcutaneous tissue with no drainage but with persistent large joint effusion. She had I&D on 03/12/2017. Orthopedics following   Assessment & Plan:   Active Problems:   Wound dehiscence   1. Left knee wound dehiscence.  - Continue Daptomycin iv and oral Cipro (current outpatient regimen as per ID recommendations during prior hospitalization) - Status post I&D by orthopedics on 03/12/2017. Orthopedics is recommending AKA.  Follow further orthopedic recommendations and timing of AKA once patient talks to her family members. - Continue pain control  - Continue daptomycin and ciprofloxacin for now as per ID recommendations in the past. Antibiotics can be streamlined after AKA.  2. Hypertension.  - Continue amlodine, hydralazine, lasix and metoprolol  - Monitor Blood pressure  3.  Chronic kidney disease V.  - Patient euvolemic. Continue Lasix - monitor AM labs. Creatinine stable  4. Type 2 diabetes mellitus. Continue Lantus and sliding scale insulin - Accu-Chek stable  5. Depression. - continue Zoloft.  6. Chronic anemia probably anemia of chronic disease from CKD - Hemoglobin 7 today. No need for transfusion today. Repeat a.m. labs   DVT prophylaxis: heparin  Code Status:  Full Family Communication: none at bedside Disposition Plan: pending clinical outcome  Consultants: Orthopedics  Procedures: I&D of left knee on 03/12/2017  Antimicrobials: Daptomycin iv  02/08/17>> Oral cipro 02/09/17>>   Subjective: Patient seen and examined at bedside.  No overnight fever, nausea or vomiting. She complains of left knee pain. Objective: Vitals:   03/16/17 0300 03/16/17 1320 03/16/17 2118 03/17/17 0515  BP: 140/61 (!) 155/60 (!) 152/65 (!) 148/65  Pulse: 67 65 73 61  Resp: 18 18 19 17   Temp: 98.8 F (37.1 C) 98.9 F (37.2 C) 98.5 F (36.9 C) 97.9 F (36.6 C)  TempSrc: Oral Oral Oral Oral  SpO2: 99% 98% 100% 100%  Weight:        Intake/Output Summary (Last 24 hours) at 03/17/17 4431 Last data filed at 03/16/17 2119  Gross per 24 hour  Intake              480 ml  Output                0 ml  Net              480 ml   Filed Weights   03/12/17 1000  Weight: 72.1 kg (158 lb 15.2 oz)    Examination:  General exam: No acute distress Respiratory system: bilateral decreased breath sounds at bases Cardiovascular system: S1 & S2 heard, rate controlled Gastrointestinal system: Abdomen is nondistended, soft and nontender. Normal bowel sounds heard. Extremities: No cyanosis, clubbing, trace edema; Left knee dressing present   Data Reviewed: I have personally reviewed following labs and imaging studies  CBC:  Recent Labs Lab 03/13/17 0817 03/14/17 0500 03/15/17 0459 03/16/17 0403 03/17/17 0421  WBC 9.3 7.5 7.3 7.2 6.7  NEUTROABS 6.7 5.0 4.8 5.1 4.6  HGB 7.2* 6.7* 7.7* 7.5* 7.0*  HCT 22.8* 21.2* 24.2* 24.0* 22.5*  MCV 86.7 86.5 86.4 87.0 86.9  PLT 311 291 273 251 885   Basic Metabolic Panel:  Recent Labs Lab 03/13/17 0817 03/14/17 0500 03/15/17 0459 03/16/17 0403 03/17/17 0421  NA 135 135 135 135 136  K 3.9 3.8 3.7 3.7 3.7  CL 105 105 104 107 107  CO2 21* 21* 22 23 22   GLUCOSE 153* 145* 88 98 115*  BUN 45* 46* 44* 47* 52*  CREATININE 3.55* 3.68* 3.65* 3.66* 3.71*  CALCIUM 8.3* 8.4* 8.5* 8.3* 8.5*  MG 1.9 2.1 2.1 2.0 2.0   GFR: Estimated Creatinine Clearance: 15.3 mL/min (A) (by C-G formula based on SCr of 3.71 mg/dL  (H)). Liver Function Tests:  Recent Labs Lab 03/10/17 1919 03/11/17 0522  AST 37 20  ALT 40 33  ALKPHOS 169* 164*  BILITOT 0.3 0.7  PROT 8.1 7.1  ALBUMIN 2.9* 2.6*   No results for input(s): LIPASE, AMYLASE in the last 168 hours. No results for input(s): AMMONIA in the last 168 hours. Coagulation Profile: No results for input(s): INR, PROTIME in the last 168 hours. Cardiac Enzymes:  Recent Labs Lab 03/12/17 0346  CKTOTAL 41   BNP (last 3 results) No results for input(s): PROBNP in the last 8760 hours. HbA1C: No results for input(s): HGBA1C in the last 72 hours. CBG:  Recent Labs Lab 03/16/17 0623 03/16/17 1204 03/16/17 1702 03/16/17 2202 03/17/17 0646  GLUCAP 102* 178* 128* 116* 118*   Lipid Profile: No results for input(s): CHOL, HDL, LDLCALC, TRIG, CHOLHDL, LDLDIRECT in the last 72 hours. Thyroid Function Tests: No results for input(s): TSH, T4TOTAL, FREET4, T3FREE, THYROIDAB in the last 72 hours. Anemia Panel: No results for input(s): VITAMINB12, FOLATE, FERRITIN, TIBC, IRON, RETICCTPCT in the last 72 hours. Sepsis Labs: No results for input(s): PROCALCITON, LATICACIDVEN in the last 168 hours.  Recent Results (from the past 240 hour(s))  Surgical pcr screen     Status: None   Collection Time: 03/12/17  3:14 AM  Result Value Ref Range Status   MRSA, PCR NEGATIVE NEGATIVE Final   Staphylococcus aureus NEGATIVE NEGATIVE Final    Comment: (NOTE) The Xpert SA Assay (FDA approved for NASAL specimens in patients 39 years of age and older), is one component of a comprehensive surveillance program. It is not intended to diagnose infection nor to guide or monitor treatment.          Radiology Studies: No results found.      Scheduled Meds: . amLODipine  10 mg Oral QHS  . aspirin EC  81 mg Oral Daily  . calcitRIOL  0.25 mcg Oral Q M,W,F  . ciprofloxacin  250 mg Oral Q breakfast  . ferrous sulfate  325 mg Oral BID WC  . furosemide  80 mg Oral BID   . gabapentin  100 mg Oral BID  . hydrALAZINE  100 mg Oral BID  . insulin aspart  0-9 Units Subcutaneous TID WC  . insulin glargine  15 Units Subcutaneous QAC breakfast  . metoprolol succinate  50 mg Oral BID  . multivitamin with minerals  1 tablet Oral Daily  . nicotine  14 mg Transdermal Daily  . pantoprazole  40 mg Oral Daily  . senna-docusate  2 tablet Oral QHS  . sertraline  100 mg Oral Daily  . sodium chloride flush  3 mL Intravenous Q12H   Continuous Infusions: . sodium chloride 0 mL (03/11/17 0700)  . sodium chloride 10 mL/hr at 03/12/17 1502  .  DAPTOmycin (CUBICIN)  IV Stopped (03/16/17 1548)     LOS: 7 days        Aline August, MD [facility] Pager [phone]  If 7PM-7AM, please contact night-coverage [email or web address] Password [email or web address] 03/17/2017, 9:17 AM

## 2017-03-17 NOTE — Telephone Encounter (Signed)
She states her family is gone now, I told her these doctors are in clinic seeing patients right now so they couldn't have just came up there at that moment anyway

## 2017-03-17 NOTE — Progress Notes (Signed)
[name], female   DOB: 11/26/1955, 61 y.o.   MRN: [id number] Patient is meeting with family today to discuss her surgical options. We will plan for surgery on Friday for above-the-knee amputation if this is the family's wishes

## 2017-03-18 LAB — CBC WITH DIFFERENTIAL/PLATELET
Basophils Absolute: 0 10*3/uL (ref 0.0–0.1)
Basophils Relative: 0 %
Eosinophils Absolute: 0.3 10*3/uL (ref 0.0–0.7)
Eosinophils Relative: 4 %
HCT: 22.1 % — ABNORMAL LOW (ref 36.0–46.0)
Hemoglobin: 6.9 g/dL — CL (ref 12.0–15.0)
Lymphocytes Relative: 13 %
Lymphs Abs: 0.9 10*3/uL (ref 0.7–4.0)
MCH: 27.1 pg (ref 26.0–34.0)
MCHC: 31.2 g/dL (ref 30.0–36.0)
MCV: 86.7 fL (ref 78.0–100.0)
Monocytes Absolute: 0.9 10*3/uL (ref 0.1–1.0)
Monocytes Relative: 13 %
Neutro Abs: 4.7 10*3/uL (ref 1.7–7.7)
Neutrophils Relative %: 70 %
Platelets: 191 10*3/uL (ref 150–400)
RBC: 2.55 MIL/uL — ABNORMAL LOW (ref 3.87–5.11)
RDW: 16.4 % — ABNORMAL HIGH (ref 11.5–15.5)
WBC: 6.7 10*3/uL (ref 4.0–10.5)

## 2017-03-18 LAB — BASIC METABOLIC PANEL
Anion gap: 9 (ref 5–15)
BUN: 54 mg/dL — ABNORMAL HIGH (ref 6–20)
CO2: 22 mmol/L (ref 22–32)
Calcium: 8.5 mg/dL — ABNORMAL LOW (ref 8.9–10.3)
Chloride: 106 mmol/L (ref 101–111)
Creatinine, Ser: 3.66 mg/dL — ABNORMAL HIGH (ref 0.44–1.00)
GFR calc Af Amer: 14 mL/min — ABNORMAL LOW (ref >60)
GFR calc non Af Amer: 12 mL/min — ABNORMAL LOW (ref >60)
Glucose, Bld: 97 mg/dL (ref 65–99)
Potassium: 3.8 mmol/L (ref 3.5–5.1)
Sodium: 137 mmol/L (ref 135–145)

## 2017-03-18 LAB — GLUCOSE, CAPILLARY
Glucose-Capillary: 135 mg/dL — ABNORMAL HIGH (ref 65–99)
Glucose-Capillary: 116 mg/dL — ABNORMAL HIGH (ref 65–99)
Glucose-Capillary: 156 mg/dL — ABNORMAL HIGH (ref 65–99)
Glucose-Capillary: 133 mg/dL — ABNORMAL HIGH (ref 65–99)
Glucose-Capillary: 117 mg/dL — ABNORMAL HIGH (ref 65–99)

## 2017-03-18 LAB — PREPARE RBC (CROSSMATCH)

## 2017-03-18 LAB — MAGNESIUM: Magnesium: 1.9 mg/dL (ref 1.7–2.4)

## 2017-03-18 MED ORDER — SODIUM CHLORIDE 0.9 % IV SOLN: Freq: Once | INTRAVENOUS | Status: DC

## 2017-03-18 NOTE — Progress Notes (Signed)
  PROGRESS NOTE  Brandy Houston IRW:[id number] DOB: 1955/08/26 DOA: 03/10/2017 PCP: [name], MD  Brief Narrative: 61 year old woman PMH left knee wound dehiscence,currently on IV antibiotic and oral antibiotic. Given multiple complications, orthopedics has recommended AKA.  Assessment/Plan Left knee wound dehiscence.status post incision and drainage 9/27. Orthopedics recommending AKA. -continue daptomycin and ciprofloxacin as recommended by infectious disease previously -AKA planned 10/5.  Anemia of chronic disease with acute component secondary to acute illness. -transfuse 1 unit PRBC today  Essential hypertension -stable, continue amlodipine, furosemide, hydralazine, metoprolol succinate.  Chronic kidney disease stage V -stable.  Diabetes mellitus type 2 -stable. Continue Lantus and SSI   DVT prophylaxis: SCDs Code Status: full Family Communication: none Disposition Plan: home    Murray Hodgkins, MD  [facility] Direct contact: [phone] --[address]ia [place]  --[email or web address]; password [email or web address]  7PM-7AM contact night coverage as above 03/18/2017, 5:24 PM  LOS: 8 days   Consultants:  orthopedics  Procedures:    Antimicrobials:  Ciprofloxacin  Daptomycin  Interval history/Subjective: Feels ok. Has pain in left leg.  Objective: Vitals: 98.9, 20, 64, 165/67, 100% on room air  Exam:     Constitutional: appears calm and comfortable, sitting in chair  Resp: CTA bilaterally, no w/r/r. Normal respiratory effort.   CV: RRR no m/r/g.   Psych: alert, speech fluent and clear   I have personally reviewed the following:   Labs:  Basic metabolic panelwithout significant change. Renal function appears to be at baseline. Magnesium within normal limits.  Hemoglobin 6.9.  Scheduled Meds: . amLODipine  10 mg Oral QHS  . aspirin EC  81 mg Oral Daily  . calcitRIOL  0.25 mcg Oral Q M,W,F  . ciprofloxacin  250 mg Oral Q breakfast  . ferrous sulfate   325 mg Oral BID WC  . furosemide  80 mg Oral BID  . gabapentin  100 mg Oral BID  . hydrALAZINE  100 mg Oral BID  . insulin aspart  0-9 Units Subcutaneous TID WC  . insulin glargine  15 Units Subcutaneous QAC breakfast  . metoprolol succinate  50 mg Oral BID  . multivitamin with minerals  1 tablet Oral Daily  . nicotine  14 mg Transdermal Daily  . pantoprazole  40 mg Oral Daily  . senna-docusate  2 tablet Oral QHS  . sertraline  100 mg Oral Daily  . sodium chloride flush  3 mL Intravenous Q12H   Continuous Infusions: . sodium chloride 0 mL (03/11/17 0700)  . sodium chloride 10 mL/hr at 03/12/17 1502  . sodium chloride    . DAPTOmycin (CUBICIN)  IV 420 mg (03/18/17 1718)    Principal Problem:   Wound dehiscence Active Problems:   Essential hypertension   Uncontrolled type 2 diabetes mellitus with complication ([name])   Depression   Anemia of chronic disease   CKD (chronic kidney disease), stage V ([place])   LOS: 8 days

## 2017-03-18 NOTE — Progress Notes (Signed)
1 unit PRBC is not ready yet; RN will transfuse when blood is ready.

## 2017-03-18 NOTE — Progress Notes (Signed)
IV team RN Shirlean Mylar) states bedside RN can disconnect pt's blood to reconnect IVPB.

## 2017-03-18 NOTE — Progress Notes (Signed)
CRITICAL VALUE ALERT  Critical Value:  Hemoglobin- 6.9  Date & Time Notied:  03-18-17 / 5:25am  Provider Notified: [facility]  Orders Received/Actions taken: Ordered 1 unit of blood

## 2017-03-18 NOTE — Progress Notes (Signed)
RN changed pt's ace wrap dressing.

## 2017-03-19 ENCOUNTER — Ambulatory Visit: Payer: Medicare Other | Admitting: Internal Medicine

## 2017-03-19 LAB — CBC
HCT: 23.5 % — ABNORMAL LOW (ref 36.0–46.0)
Hemoglobin: 7.5 g/dL — ABNORMAL LOW (ref 12.0–15.0)
MCH: 27.6 pg (ref 26.0–34.0)
MCHC: 31.9 g/dL (ref 30.0–36.0)
MCV: 86.4 fL (ref 78.0–100.0)
Platelets: 227 10*3/uL (ref 150–400)
RBC: 2.72 MIL/uL — ABNORMAL LOW (ref 3.87–5.11)
RDW: 16.6 % — ABNORMAL HIGH (ref 11.5–15.5)
WBC: 5.4 10*3/uL (ref 4.0–10.5)

## 2017-03-19 LAB — GLUCOSE, CAPILLARY
Glucose-Capillary: 89 mg/dL (ref 65–99)
Glucose-Capillary: 128 mg/dL — ABNORMAL HIGH (ref 65–99)
Glucose-Capillary: 147 mg/dL — ABNORMAL HIGH (ref 65–99)
Glucose-Capillary: 137 mg/dL — ABNORMAL HIGH (ref 65–99)

## 2017-03-19 LAB — PREPARE RBC (CROSSMATCH)

## 2017-03-19 LAB — CK: Total CK: 42 U/L (ref 38–234)

## 2017-03-19 MED ORDER — SODIUM CHLORIDE 0.9 % IV SOLN
Freq: Once | INTRAVENOUS | Status: AC
  Administered 2017-03-19: 12:00:00 via INTRAVENOUS

## 2017-03-19 NOTE — Progress Notes (Signed)
Pharmacy Antibiotic Note  Brandy Houston is a 61 y.o. 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 Patient was readmitted 03/12/17 with non-healing wound now s/p I&D on 03/13/17.  Pharmacy has been consulted to manage antibiotics dosing and monitoring.   Patient has CKD with stable SCr compared to previous admission, est. CrCL ~ 15-20 ml/min.   She is afebrile and her WBC is WNL.  CK remains stable since Cubicin was started.   Plan: Continue Cubicin 420mg  IV Q48H (~6 mg/kg TBW) Continue Cipro 250 mg PO daily CK q-Thurs F/u streamline abx post AKA Fri   Weight: 158 lb 15.2 oz (72.1 kg)  Temp (24hrs), Avg:98.4 F (36.9 C), Min:97.3 F (36.3 C), Max:99.1 F (37.3 C)   Recent Labs Lab 03/14/17 0500 03/15/17 0459 03/16/17 0403 03/17/17 0421 03/18/17 0457 03/19/17 0453  WBC 7.5 7.3 7.2 6.7 6.7 5.4  CREATININE 3.68* 3.65* 3.66* 3.71* 3.66*  --     Estimated Creatinine Clearance: 15.5 mL/min (A) (by C-G formula based on SCr of 3.66 mg/dL (H)).    Allergies  Allergen Reactions  . Compazine [Prochlorperazine] Shortness Of Breath and Swelling    TONGUE SWELLS  . Shellfish-Derived Products Anaphylaxis  . Iodinated Diagnostic Agents Hives and Rash  . Omnipaque [Iohexol] Hives    Cubicin 8/26 >> 8/24 Vanc >> 8/25 Cipro 8/27 >>  8/26 CK = 77 9/3 CK = 38 9/27 CK = 41 10/4 CK = 42  8/21 left knee aspirate - negative 8/24 left knee tissue - rare bacteroides oralis (beta lactamase +) - may be contaminant per ID, to f/u this week 8/25 BCx - NF 8/26 UCx - >100K yeast   Hovanes Hymas D. Mina Marble, PharmD, BCPS Pager:  [phone] 03/19/2017, 8:59 AM

## 2017-03-19 NOTE — Progress Notes (Signed)
  PROGRESS NOTE  GEORGIAN MCCLORY JQZ:[id number] DOB: Aug 28, 1955 DOA: 03/10/2017 PCP: [name], MD  Brief Narrative: 61 year old woman PMH left knee wound dehiscence,currently on IV antibiotic and oral antibiotic. Given multiple complications, orthopedics has recommended AKA.  Assessment/Plan Left knee wound dehiscence.status post incision and drainage 9/27.  -continue empiric antibiotics -Orthopedics plans AKA 10/5  Anemia of chronic disease with acute component secondary to acute illness. -Status post transfusion 1 unit 10/3 with appropriate response. Given surgery planned tomorrow, will transfuse additional unit PRBC today.  Essential hypertension -remains stable. Continue Norvasc, Lasix, hydralazine, metoprolol.  Chronic kidney disease stage V -check BMP in a.m.  Diabetes mellitus type 2 -remains stable.   DVT prophylaxis: SCDs Code Status: full Family Communication: none Disposition Plan: home    Murray Hodgkins, MD  [facility] Direct contact: [phone] --[address] [place]  --[email or web address]; password [email or web address]  7PM-7AM contact night coverage as above 03/19/2017, 12:03 PM  LOS: 9 days   Consultants:  orthopedics  Procedures:    Antimicrobials:  Ciprofloxacin  Daptomycin  Interval history/Subjective: Feels all right today. No chest pain or shortness of breath.  Objective: Vitals:afebrile, 99.1, 19, 73, 167/70, 94% on room air  Exam:     Constitutional. Appears calm, comfortable.  Respiratory. Clear to auscultation bilaterally. No wheezes, rales or rhonchi. Normal respiratory effort.  Cardiovascular. Regular rate and rhythm. No murmur, rub or gallop. No lower extremity edema.  Psychiatric. Grossly normal mood and affect. Speech fluent and appropriate.  Left lower extremity edematous.   I have personally reviewed the following:   Labs:  Hemoglobin improved, 7.5.  Scheduled Meds: . amLODipine  10 mg Oral QHS  . aspirin EC  81 mg  Oral Daily  . calcitRIOL  0.25 mcg Oral Q M,W,F  . ciprofloxacin  250 mg Oral Q breakfast  . ferrous sulfate  325 mg Oral BID WC  . furosemide  80 mg Oral BID  . gabapentin  100 mg Oral BID  . hydrALAZINE  100 mg Oral BID  . insulin aspart  0-9 Units Subcutaneous TID WC  . insulin glargine  15 Units Subcutaneous QAC breakfast  . metoprolol succinate  50 mg Oral BID  . multivitamin with minerals  1 tablet Oral Daily  . nicotine  14 mg Transdermal Daily  . pantoprazole  40 mg Oral Daily  . senna-docusate  2 tablet Oral QHS  . sertraline  100 mg Oral Daily  . sodium chloride flush  3 mL Intravenous Q12H   Continuous Infusions: . sodium chloride 0 mL (03/11/17 0700)  . sodium chloride 10 mL/hr at 03/12/17 1502  . sodium chloride    . DAPTOmycin (CUBICIN)  IV Stopped (03/18/17 1748)    Principal Problem:   Wound dehiscence Active Problems:   Essential hypertension   Uncontrolled type 2 diabetes mellitus with complication ([name])   Depression   Anemia of chronic disease   CKD (chronic kidney disease), stage V ([place])   LOS: 9 days

## 2017-03-19 NOTE — Anesthesia Preprocedure Evaluation (Signed)
Anesthesia Evaluation  Patient identified by MRN, date of birth, ID band Patient awake    Reviewed: Allergy & Precautions, H&P , NPO status , Patient's Chart, lab work & pertinent test results  Airway Mallampati: II   Neck ROM: full    Dental  (+) Dental Advisory Given   Pulmonary Current Smoker,    breath sounds clear to auscultation       Cardiovascular hypertension, +CHF  + dysrhythmias + Valvular Problems/Murmurs AI  Rhythm:regular Rate:Normal  TTE + TEE 07/2014 - normal EF, grade 2 diastolic dysfunction, mild-mod AI  Prolonged QTc on EKG   Neuro/Psych PSYCHIATRIC DISORDERS Anxiety Depression  Neuromuscular disease    GI/Hepatic GERD  ,(+) Hepatitis -, C  Endo/Other  diabetes, Type 2  Renal/GU CRFRenal disease     Musculoskeletal  (+) Arthritis ,   Abdominal   Peds  Hematology  (+) anemia ,   Anesthesia Other Findings   Reproductive/Obstetrics                             Anesthesia Physical  Anesthesia Plan  ASA: III  Anesthesia Plan: General   Post-op Pain Management:    Induction: Intravenous  PONV Risk Score and Plan: 3 and Ondansetron, Dexamethasone, Midazolam and Treatment may vary due to age or medical condition  Airway Management Planned: LMA  Additional Equipment: None  Intra-op Plan:   Post-operative Plan:   Informed Consent: I have reviewed the patients History and Physical, chart, labs and discussed the procedure including the risks, benefits and alternatives for the proposed anesthesia with the patient or authorized representative who has indicated his/her understanding and acceptance.   Dental advisory given  Plan Discussed with: CRNA  Anesthesia Plan Comments:         Anesthesia Quick Evaluation

## 2017-03-19 NOTE — Progress Notes (Signed)
[name], female   DOB: 1955/07/08, 61 y.o.   MRN: [id number] I discussed with the patient the surgical option of a above-the-knee amputation for her dehiscence of infected total knee arthroplasty. Patient states she understands wish to proceed with surgery at this time she will be nothing by mouth after midnight plan for left above-the-knee amputation tomorrow Friday.

## 2017-03-20 ENCOUNTER — Encounter (HOSPITAL_COMMUNITY)
Admission: AD | Disposition: A | Discharge status: Skilled Nursing Facility | Payer: Self-pay | Source: Ambulatory Visit | Attending: Internal Medicine

## 2017-03-20 ENCOUNTER — Inpatient Hospital Stay (HOSPITAL_COMMUNITY): Payer: Medicare Other | Admitting: Anesthesiology

## 2017-03-20 ENCOUNTER — Encounter (HOSPITAL_COMMUNITY): Payer: Self-pay | Admitting: *Deleted

## 2017-03-20 HISTORY — PX: AMPUTATION: SHX166

## 2017-03-20 LAB — BASIC METABOLIC PANEL
Anion gap: 4 — ABNORMAL LOW (ref 5–15)
BUN: 52 mg/dL — ABNORMAL HIGH (ref 6–20)
CO2: 23 mmol/L (ref 22–32)
Calcium: 8.4 mg/dL — ABNORMAL LOW (ref 8.9–10.3)
Chloride: 108 mmol/L (ref 101–111)
Creatinine, Ser: 3.31 mg/dL — ABNORMAL HIGH (ref 0.44–1.00)
GFR calc Af Amer: 16 mL/min — ABNORMAL LOW (ref >60)
GFR calc non Af Amer: 14 mL/min — ABNORMAL LOW (ref >60)
Glucose, Bld: 95 mg/dL (ref 65–99)
Potassium: 3.2 mmol/L — ABNORMAL LOW (ref 3.5–5.1)
Sodium: 135 mmol/L (ref 135–145)

## 2017-03-20 LAB — GLUCOSE, CAPILLARY
Glucose-Capillary: 93 mg/dL (ref 65–99)
Glucose-Capillary: 90 mg/dL (ref 65–99)
Glucose-Capillary: 105 mg/dL — ABNORMAL HIGH (ref 65–99)
Glucose-Capillary: 145 mg/dL — ABNORMAL HIGH (ref 65–99)
Glucose-Capillary: 270 mg/dL — ABNORMAL HIGH (ref 65–99)

## 2017-03-20 LAB — CBC
HCT: 23.7 % — ABNORMAL LOW (ref 36.0–46.0)
Hemoglobin: 7.7 g/dL — ABNORMAL LOW (ref 12.0–15.0)
MCH: 27.8 pg (ref 26.0–34.0)
MCHC: 32.5 g/dL (ref 30.0–36.0)
MCV: 85.6 fL (ref 78.0–100.0)
Platelets: 207 10*3/uL (ref 150–400)
RBC: 2.77 MIL/uL — ABNORMAL LOW (ref 3.87–5.11)
RDW: 16.4 % — ABNORMAL HIGH (ref 11.5–15.5)
WBC: 5.4 10*3/uL (ref 4.0–10.5)

## 2017-03-20 SURGERY — AMPUTATION, ABOVE KNEE
Anesthesia: General | Laterality: Left

## 2017-03-20 MED ORDER — SODIUM CHLORIDE 0.9 % IV SOLN
INTRAVENOUS | Status: DC
  Administered 2017-03-20: 12:00:00 via INTRAVENOUS

## 2017-03-20 MED ORDER — ONDANSETRON HCL 4 MG PO TABS: 4.0000 mg | ORAL_TABLET | Freq: Four times a day (QID) | ORAL | Status: DC | PRN

## 2017-03-20 MED ORDER — FENTANYL CITRATE (PF) 250 MCG/5ML IJ SOLN
INTRAMUSCULAR | Status: AC
  Filled 2017-03-20: qty 5

## 2017-03-20 MED ORDER — ACETAMINOPHEN 325 MG PO TABS: 650.0000 mg | ORAL_TABLET | Freq: Four times a day (QID) | ORAL | Status: DC | PRN

## 2017-03-20 MED ORDER — PROPOFOL 10 MG/ML IV BOLUS
INTRAVENOUS | Status: DC | PRN
  Administered 2017-03-20: 100 mg via INTRAVENOUS

## 2017-03-20 MED ORDER — ACETAMINOPHEN 650 MG RE SUPP
650.0000 mg | Freq: Four times a day (QID) | RECTAL | Status: DC | PRN
Start: 2017-03-20 — End: 2017-03-20

## 2017-03-20 MED ORDER — METOCLOPRAMIDE HCL 5 MG/ML IJ SOLN: 5.0000 mg | Freq: Three times a day (TID) | INTRAMUSCULAR | Status: DC | PRN

## 2017-03-20 MED ORDER — FENTANYL CITRATE (PF) 100 MCG/2ML IJ SOLN
25.0000 ug | INTRAMUSCULAR | Status: DC | PRN
  Administered 2017-03-20 (×4): 25 ug via INTRAVENOUS

## 2017-03-20 MED ORDER — MIDAZOLAM HCL 2 MG/2ML IJ SOLN
INTRAMUSCULAR | Status: AC
  Filled 2017-03-20: qty 2

## 2017-03-20 MED ORDER — POLYETHYLENE GLYCOL 3350 17 G PO PACK: 17.0000 g | PACK | Freq: Every day | ORAL | Status: DC | PRN

## 2017-03-20 MED ORDER — DOCUSATE SODIUM 100 MG PO CAPS
100.0000 mg | ORAL_CAPSULE | Freq: Two times a day (BID) | ORAL | Status: DC
  Administered 2017-03-20 – 2017-03-26 (×12): 100 mg via ORAL
  Filled 2017-03-20 (×12): qty 1

## 2017-03-20 MED ORDER — 0.9 % SODIUM CHLORIDE (POUR BTL) OPTIME
TOPICAL | Status: DC | PRN
  Administered 2017-03-20: 1000 mL

## 2017-03-20 MED ORDER — CEFAZOLIN SODIUM-DEXTROSE 2-4 GM/100ML-% IV SOLN
2.0000 g | INTRAVENOUS | Status: AC
  Administered 2017-03-20: 2 g via INTRAVENOUS
  Filled 2017-03-20: qty 100

## 2017-03-20 MED ORDER — LIDOCAINE HCL (CARDIAC) 20 MG/ML IV SOLN
INTRAVENOUS | Status: DC | PRN
  Administered 2017-03-20: 100 mg via INTRAVENOUS

## 2017-03-20 MED ORDER — MAGNESIUM CITRATE PO SOLN: 1.0000 | Freq: Once | ORAL | Status: DC | PRN

## 2017-03-20 MED ORDER — METHOCARBAMOL 500 MG PO TABS
500.0000 mg | ORAL_TABLET | Freq: Four times a day (QID) | ORAL | Status: DC | PRN
  Administered 2017-03-20 – 2017-03-26 (×10): 500 mg via ORAL
  Filled 2017-03-20 (×11): qty 1

## 2017-03-20 MED ORDER — SODIUM CHLORIDE 0.9 % IV SOLN: INTRAVENOUS | Status: DC

## 2017-03-20 MED ORDER — ONDANSETRON HCL 4 MG/2ML IJ SOLN
INTRAMUSCULAR | Status: DC | PRN
  Administered 2017-03-20: 4 mg via INTRAVENOUS

## 2017-03-20 MED ORDER — STERILE WATER FOR INJECTION IJ SOLN
INTRAMUSCULAR | Status: DC | PRN
  Administered 2017-03-20: 500 mL via SURGICAL_CAVITY

## 2017-03-20 MED ORDER — OXYCODONE HCL 5 MG PO TABS
5.0000 mg | ORAL_TABLET | ORAL | Status: DC | PRN
  Administered 2017-03-20 – 2017-03-21 (×3): 10 mg via ORAL
  Filled 2017-03-20 (×5): qty 2

## 2017-03-20 MED ORDER — MIDAZOLAM HCL 5 MG/5ML IJ SOLN
INTRAMUSCULAR | Status: DC | PRN
  Administered 2017-03-20: 2 mg via INTRAVENOUS

## 2017-03-20 MED ORDER — METHOCARBAMOL 1000 MG/10ML IJ SOLN
500.0000 mg | Freq: Four times a day (QID) | INTRAVENOUS | Status: DC | PRN
  Filled 2017-03-20: qty 5

## 2017-03-20 MED ORDER — ONDANSETRON HCL 4 MG/2ML IJ SOLN
INTRAMUSCULAR | Status: AC
  Filled 2017-03-20: qty 2

## 2017-03-20 MED ORDER — CHLORHEXIDINE GLUCONATE 4 % EX LIQD
60.0000 mL | Freq: Once | CUTANEOUS | Status: AC
  Administered 2017-03-20: 4 via TOPICAL
  Filled 2017-03-20: qty 15

## 2017-03-20 MED ORDER — ONDANSETRON HCL 4 MG/2ML IJ SOLN: 4.0000 mg | Freq: Four times a day (QID) | INTRAMUSCULAR | Status: DC | PRN

## 2017-03-20 MED ORDER — HYDROMORPHONE HCL 1 MG/ML IJ SOLN
1.0000 mg | INTRAMUSCULAR | Status: DC | PRN
  Administered 2017-03-20 – 2017-03-26 (×30): 1 mg via INTRAVENOUS
  Filled 2017-03-20 (×32): qty 1

## 2017-03-20 MED ORDER — METOCLOPRAMIDE HCL 5 MG PO TABS: 5.0000 mg | ORAL_TABLET | Freq: Three times a day (TID) | ORAL | Status: DC | PRN

## 2017-03-20 MED ORDER — PROPOFOL 10 MG/ML IV BOLUS
INTRAVENOUS | Status: AC
  Filled 2017-03-20: qty 20

## 2017-03-20 MED ORDER — FENTANYL CITRATE (PF) 100 MCG/2ML IJ SOLN
INTRAMUSCULAR | Status: DC | PRN
  Administered 2017-03-20 (×2): 50 ug via INTRAVENOUS

## 2017-03-20 MED ORDER — FENTANYL CITRATE (PF) 100 MCG/2ML IJ SOLN
INTRAMUSCULAR | Status: AC
  Filled 2017-03-20: qty 2

## 2017-03-20 MED ORDER — BISACODYL 10 MG RE SUPP: 10.0000 mg | Freq: Every day | RECTAL | Status: DC | PRN

## 2017-03-20 MED ORDER — POTASSIUM CHLORIDE CRYS ER 20 MEQ PO TBCR
40.0000 meq | EXTENDED_RELEASE_TABLET | Freq: Once | ORAL | Status: AC
  Administered 2017-03-20: 40 meq via ORAL
  Filled 2017-03-20: qty 2

## 2017-03-20 SURGICAL SUPPLY — 54 items
BLADE SAW RECIP 87.9 MT (BLADE) ×4 IMPLANT
BLADE SURG 10 STRL SS (BLADE) ×2 IMPLANT
BNDG COHESIVE 6X5 TAN STRL LF (GAUZE/BANDAGES/DRESSINGS) ×4 IMPLANT
BNDG GAUZE ELAST 4 BULKY (GAUZE/BANDAGES/DRESSINGS) IMPLANT
CANISTER WOUND CARE 500ML ATS (WOUND CARE) IMPLANT
CLIP TI MEDIUM 6 (CLIP) ×2 IMPLANT
COVER SURGICAL LIGHT HANDLE (MISCELLANEOUS) ×4 IMPLANT
CUFF TOURNIQUET SINGLE 34IN LL (TOURNIQUET CUFF) IMPLANT
CUFF TOURNIQUET SINGLE 44IN (TOURNIQUET CUFF) IMPLANT
DRAIN PENROSE 1/2X12 LTX STRL (WOUND CARE) IMPLANT
DRAPE INCISE IOBAN 66X45 STRL (DRAPES) ×4 IMPLANT
DRAPE U-SHAPE 47X51 STRL (DRAPES) ×2 IMPLANT
DRESSING PREVENA PLUS CUSTOM (GAUZE/BANDAGES/DRESSINGS) ×1 IMPLANT
DRSG ADAPTIC 3X8 NADH LF (GAUZE/BANDAGES/DRESSINGS) IMPLANT
DRSG PAD ABDOMINAL 8X10 ST (GAUZE/BANDAGES/DRESSINGS) IMPLANT
DRSG PREVENA PLUS CUSTOM (GAUZE/BANDAGES/DRESSINGS) ×2
DURAPREP 26ML APPLICATOR (WOUND CARE) ×2 IMPLANT
ELECT CAUTERY BLADE 6.4 (BLADE) IMPLANT
ELECT REM PT RETURN 9FT ADLT (ELECTROSURGICAL) ×2
ELECTRODE REM PT RTRN 9FT ADLT (ELECTROSURGICAL) ×1 IMPLANT
EVACUATOR 1/8 PVC DRAIN (DRAIN) IMPLANT
GAUZE SPONGE 4X4 12PLY STRL (GAUZE/BANDAGES/DRESSINGS) IMPLANT
GLOVE BIOGEL PI IND STRL 6 (GLOVE) ×1 IMPLANT
GLOVE BIOGEL PI IND STRL 6.5 (GLOVE) ×1 IMPLANT
GLOVE BIOGEL PI IND STRL 9 (GLOVE) ×1 IMPLANT
GLOVE BIOGEL PI INDICATOR 6 (GLOVE) ×1
GLOVE BIOGEL PI INDICATOR 6.5 (GLOVE) ×1
GLOVE BIOGEL PI INDICATOR 9 (GLOVE) ×1
GLOVE SURG ORTHO 9.0 STRL STRW (GLOVE) ×2 IMPLANT
GLOVE SURG SS PI 6.5 STRL IVOR (GLOVE) ×2 IMPLANT
GOWN STRL REUS W/ TWL LRG LVL3 (GOWN DISPOSABLE) ×1 IMPLANT
GOWN STRL REUS W/ TWL XL LVL3 (GOWN DISPOSABLE) ×1 IMPLANT
GOWN STRL REUS W/TWL LRG LVL3 (GOWN DISPOSABLE) ×3 IMPLANT
GOWN STRL REUS W/TWL XL LVL3 (GOWN DISPOSABLE) ×1
KIT BASIN OR (CUSTOM PROCEDURE TRAY) ×2 IMPLANT
KIT ROOM TURNOVER OR (KITS) ×2 IMPLANT
MANIFOLD NEPTUNE II (INSTRUMENTS) ×2 IMPLANT
NS IRRIG 1000ML POUR BTL (IV SOLUTION) ×2 IMPLANT
PACK ORTHO EXTREMITY (CUSTOM PROCEDURE TRAY) ×2 IMPLANT
PAD ARMBOARD 7.5X6 YLW CONV (MISCELLANEOUS) ×2 IMPLANT
SPONGE LAP 18X18 X RAY DECT (DISPOSABLE) ×4 IMPLANT
STAPLER VISISTAT 35W (STAPLE) IMPLANT
STOCKINETTE IMPERVIOUS LG (DRAPES) ×2 IMPLANT
SUT ETHILON 2 0 PSLX (SUTURE) ×6 IMPLANT
SUT PDS AB 1 CT  36 (SUTURE)
SUT PDS AB 1 CT 36 (SUTURE) IMPLANT
SUT SILK 2 0 (SUTURE) ×1
SUT SILK 2-0 18XBRD TIE 12 (SUTURE) ×1 IMPLANT
SUT VIC AB 1 CTX 27 (SUTURE) ×2 IMPLANT
TOWEL OR 17X24 6PK STRL BLUE (TOWEL DISPOSABLE) IMPLANT
TOWEL OR 17X26 10 PK STRL BLUE (TOWEL DISPOSABLE) IMPLANT
TUBE CONNECTING 20X1/4 (TUBING) ×2 IMPLANT
WATER STERILE IRR 1000ML POUR (IV SOLUTION) ×2 IMPLANT
YANKAUER SUCT BULB TIP NO VENT (SUCTIONS) ×2 IMPLANT

## 2017-03-20 NOTE — Progress Notes (Signed)
  PROGRESS NOTE  Brandy Houston [id number]:[id number] DOB: Oct 19, 1955 DOA: 03/10/2017 PCP: [name], MD  Brief Narrative: 61 year old woman PMH left knee wound dehiscence,currently on IV antibiotic and oral antibiotic. Given multiple complications, orthopedics has recommended AKA.  Assessment/Plan Left knee wound dehiscence.status post incision and drainage 9/27.  -Status post left AKA 10/5. Management per orthopedics. -Continue empiric antibiotics for now.  Anemia of chronic disease with acute component secondary to acute illness. -Status post 2 units PRBC total. Recheck CBC in a.m.  Essential hypertension -Remains stable. Continue amlodipine, furosemide, hydralazine, metoprolol.   Chronic kidney disease stage V -Creatinine remains stable  Diabetes mellitus type 2 -Capillary blood sugars remain stable   DVT prophylaxis: SCDs Code Status: full Family Communication: none Disposition Plan: Pending physical therapy recommendations   Murray Hodgkins, MD  [facility] Direct contact: [phone] --[address] [place]  --[email or web address]; password [email or web address]  7PM-7AM contact night coverage as above 03/20/2017, 2:01 PM  LOS: 10 days   Consultants:  Orthopedics   Procedures:  10/3 transfusion 1 unit PRBC  2/41 1 unit PRBC transfusion  10/5 PROCEDURE:  LEFT ABOVE KNEE AMPUTATION  Antimicrobials:  Ciprofloxacin  Daptomycin  Interval history/Subjective: Patient having pain status post surgery.  Objective: afebrile, vital signs stable, 98.3, 18, 65, 163/75, 100% on 2 L [place] Vitals:  Exam:    Constitutional:  Appears calm and moderately uncomfortable ENMT:  grossly normal hearing Respiratory:  CTA bilaterally, no w/r/r.  Respiratory effort normal. e Cardiovascular:  RRR, no m/r/g No RLE extremity edema   Musculoskeletal:  Moves right leg to command  I have personally reviewed the following:   Labs:  Capillary blood sugars stable.  Hemoglobin stable,  7.7  Potassium 3.2, BUN stable, creatinine stable 3.31.  Scheduled Meds: . amLODipine  10 mg Oral QHS  . aspirin EC  81 mg Oral Daily  . calcitRIOL  0.25 mcg Oral Q M,W,F  . ciprofloxacin  250 mg Oral Q breakfast  . docusate sodium  100 mg Oral BID  . fentaNYL      . ferrous sulfate  325 mg Oral BID WC  . furosemide  80 mg Oral BID  . gabapentin  100 mg Oral BID  . hydrALAZINE  100 mg Oral BID  . insulin aspart  0-9 Units Subcutaneous TID WC  . insulin glargine  15 Units Subcutaneous QAC breakfast  . metoprolol succinate  50 mg Oral BID  . multivitamin with minerals  1 tablet Oral Daily  . nicotine  14 mg Transdermal Daily  . pantoprazole  40 mg Oral Daily  . senna-docusate  2 tablet Oral QHS  . sertraline  100 mg Oral Daily  . sodium chloride flush  3 mL Intravenous Q12H   Continuous Infusions: . sodium chloride 0 mL (03/11/17 0700)  . sodium chloride 10 mL/hr at 03/12/17 1502  . sodium chloride    . sodium chloride    . sodium chloride 10 mL/hr at 03/20/17 1148  . DAPTOmycin (CUBICIN)  IV Stopped (03/18/17 1748)  . methocarbamol (ROBAXIN)  IV      Principal Problem:   Wound dehiscence Active Problems:   Essential hypertension   Uncontrolled type 2 diabetes mellitus with complication ([name])   Depression   Anemia of chronic disease   CKD (chronic kidney disease), stage V ([place])   LOS: 10 days

## 2017-03-20 NOTE — H&P (View-Only) (Signed)
[name], female   DOB: 05/14/1956, 61 y.o.   MRN: [id number] I discussed with the patient the surgical option of a above-the-knee amputation for her dehiscence of infected total knee arthroplasty. Patient states she understands wish to proceed with surgery at this time she will be nothing by mouth after midnight plan for left above-the-knee amputation tomorrow Friday.

## 2017-03-20 NOTE — Op Note (Signed)
03/10/2017 - 03/20/2017  10:24 AM  PATIENT:  Brandy Houston    PRE-OPERATIVE DIAGNOSIS:  Dehiscence Left Knee  POST-OPERATIVE DIAGNOSIS:  Same  PROCEDURE:  LEFT ABOVE KNEE AMPUTATION  SURGEON:  Newt Minion, MD  PHYSICIAN ASSISTANT:None ANESTHESIA:   General  PREOPERATIVE INDICATIONS:  Brandy Houston is a  61 y.o. female with a diagnosis of Dehiscence Left Knee who failed conservative measures and elected for surgical management.    The risks benefits and alternatives were discussed with the patient preoperatively including but not limited to the risks of infection, bleeding, nerve injury, cardiopulmonary complications, the need for revision surgery, among others, and the patient was willing to proceed.  OPERATIVE IMPLANTS: Wound VAC  OPERATIVE FINDINGS: No abscess or nonviable tissue at the level of amputation.  OPERATIVE PROCEDURE: Patient was brought to the operating room and underwent a general anesthetic. After adequate levels anesthesia obtained patient's left lower extremity was prepped using DuraPrep draped into a sterile field. The nonviable leg and wound were draped out of sterile field there was no wound exposed in the operative field. A timeout was called. A fishmouth incision was made just proximal to the previous suture line. Electrocautery was used for hemostasis. The medial vessels were clamped and suture ligated with 2-0 silk. The femur was amputated with a reciprocating saw. The leg was amputated and delivered off the field. Electrocautery was used for hemostasis. The deep and superficial fascial layers and skin was closed using 2-0 nylon. A Prevena wound VAC was applied this had a good suction fit patient was extubated taken to the PACU in stable condition.

## 2017-03-20 NOTE — Anesthesia Postprocedure Evaluation (Signed)
Anesthesia Post Note  Patient: LOISTINE EBERLIN  Procedure(s) Performed: LEFT ABOVE KNEE AMPUTATION (Left )     Patient location during evaluation: PACU Anesthesia Type: General Level of consciousness: awake and alert Pain management: pain level controlled Vital Signs Assessment: post-procedure vital signs reviewed and stable Respiratory status: spontaneous breathing, nonlabored ventilation, respiratory function stable and patient connected to nasal cannula oxygen Cardiovascular status: blood pressure returned to baseline and stable Postop Assessment: no apparent nausea or vomiting Anesthetic complications: no    Last Vitals:  Vitals:   03/20/17 1133 03/20/17 1443  BP: (!) 163/75 (!) 147/59  Pulse: 65 67  Resp: 18 18  Temp: 36.8 C 36.9 C  SpO2: 100% 100%    Last Pain:  Vitals:   03/20/17 1443  TempSrc: Oral  PainSc:                  Riccardo Dubin

## 2017-03-20 NOTE — Progress Notes (Signed)
PT Cancellation Note  Patient Details Name: Brandy Houston MRN: [id number] DOB: Jun 09, 1956   Cancelled Treatment:    Reason Eval/Treat Not Completed: Pain limiting ability to participate. Pt refusing PT again this evening secondary to pain, stating she will work with PT tomorrow. Per RN, was able to squat pivot to [facility] on RLE this afternoon and did well. Pt initially stating she wants to go home with HHPT, but after discussion on SNF vs. HHPT, sounds like she is willing to consider short-term rehab at d/c. Will follow-up for PT evaluation tomorrow.  Mabeline Caras, PT, DPT Acute Rehab Services  Pager: [place] 03/20/2017, 5:16 PM

## 2017-03-20 NOTE — Progress Notes (Signed)
PT Cancellation Note  Patient Details Name: Brandy Houston MRN: [id number] DOB: 24-Dec-1955   Cancelled Treatment:    Reason Eval/Treat Not Completed: Pain limiting ability to participate. Pt willing to discuss details regarding PLOF and home-set up, but unwilling to perform EOB/OOB activities secondary to pain despite max encouragement. Will follow-up for PT evaluation this afternoon as time allows.  Mabeline Caras, PT, DPT Acute Rehab Services  Pager: [place] 03/20/2017, 12:39 PM

## 2017-03-20 NOTE — Interval H&P Note (Signed)
History and Physical Interval Note:  03/20/2017 6:46 AM  Brandy Houston  has presented today for surgery, with the diagnosis of Dehiscence Left Knee  The various methods of treatment have been discussed with the patient and family. After consideration of risks, benefits and other options for treatment, the patient has consented to  Procedure(s): LEFT ABOVE KNEE AMPUTATION (Left) as a surgical intervention .  [name] history has been reviewed, patient examined, no change in status, stable for surgery.  I have reviewed the patient's chart and labs.  Questions were answered to the patient's satisfaction.     Newt Minion

## 2017-03-20 NOTE — Transfer of Care (Signed)
Immediate Anesthesia Transfer of Care Note  Patient: ALISON KUBICKI  Procedure(s) Performed: LEFT ABOVE KNEE AMPUTATION (Left )  Patient Location: PACU  Anesthesia Type:General  Level of Consciousness: awake  Airway & Oxygen Therapy: Patient Spontanous Breathing and Patient connected to nasal cannula oxygen  Post-op Assessment: Report given to RN and Post -op Vital signs reviewed and stable  Post vital signs: Reviewed and stable  Last Vitals:  Vitals:   03/20/17 0722 03/20/17 1010  BP: (!) 193/74   Pulse: 62   Resp:    Temp:  36.6 C  SpO2:      Last Pain:  Vitals:   03/20/17 1010  TempSrc:   PainSc: Asleep      Patients Stated Pain Goal: 0 (01/00/71 2197)  Complications: No apparent anesthesia complications

## 2017-03-21 ENCOUNTER — Encounter (HOSPITAL_COMMUNITY): Payer: Self-pay | Admitting: Orthopedic Surgery

## 2017-03-21 DIAGNOSIS — M009 Pyogenic arthritis, unspecified: Secondary | ICD-10-CM

## 2017-03-21 DIAGNOSIS — N186 End stage renal disease: Secondary | ICD-10-CM

## 2017-03-21 DIAGNOSIS — E1122 Type 2 diabetes mellitus with diabetic chronic kidney disease: Secondary | ICD-10-CM

## 2017-03-21 DIAGNOSIS — N185 Chronic kidney disease, stage 5: Secondary | ICD-10-CM

## 2017-03-21 LAB — CBC
HCT: 16.1 % — ABNORMAL LOW (ref 36.0–46.0)
Hemoglobin: 5.2 g/dL — CL (ref 12.0–15.0)
MCH: 27.4 pg (ref 26.0–34.0)
MCHC: 32.3 g/dL (ref 30.0–36.0)
MCV: 84.7 fL (ref 78.0–100.0)
Platelets: 209 10*3/uL (ref 150–400)
RBC: 1.9 MIL/uL — ABNORMAL LOW (ref 3.87–5.11)
RDW: 16.3 % — ABNORMAL HIGH (ref 11.5–15.5)
WBC: 8.5 10*3/uL (ref 4.0–10.5)

## 2017-03-21 LAB — PREPARE RBC (CROSSMATCH)

## 2017-03-21 LAB — GLUCOSE, CAPILLARY
Glucose-Capillary: 134 mg/dL — ABNORMAL HIGH (ref 65–99)
Glucose-Capillary: 145 mg/dL — ABNORMAL HIGH (ref 65–99)
Glucose-Capillary: 225 mg/dL — ABNORMAL HIGH (ref 65–99)
Glucose-Capillary: 110 mg/dL — ABNORMAL HIGH (ref 65–99)

## 2017-03-21 MED ORDER — SODIUM CHLORIDE 0.9 % IV SOLN
Freq: Once | INTRAVENOUS | Status: AC
  Administered 2017-03-21: 06:00:00 via INTRAVENOUS

## 2017-03-21 MED ORDER — TRAZODONE HCL 50 MG PO TABS
50.0000 mg | ORAL_TABLET | Freq: Every day | ORAL | Status: DC
  Administered 2017-03-21 – 2017-03-25 (×5): 50 mg via ORAL
  Filled 2017-03-21 (×5): qty 1

## 2017-03-21 MED ORDER — OXYCODONE HCL 5 MG PO TABS
5.0000 mg | ORAL_TABLET | ORAL | Status: DC | PRN
  Administered 2017-03-22: 5 mg via ORAL
  Administered 2017-03-22 – 2017-03-26 (×15): 10 mg via ORAL
  Filled 2017-03-21 (×11): qty 2
  Filled 2017-03-21: qty 1
  Filled 2017-03-21 (×5): qty 2

## 2017-03-21 NOTE — Progress Notes (Signed)
CRITICAL VALUE ALERT  Critical Value:  Hemoglobin 5.2  Date & Time Notied:  03/21/2017 0545  Provider Notified: Lovey Newcomer ,NP  Orders Received/Actions taken: orders received to transfuse patient with 2 units of blood

## 2017-03-21 NOTE — Progress Notes (Signed)
[name], female   DOB: 31-Oct-1955, 61 y.o.   MRN: [id number] Patient is postoperative day 1 left above-the-knee amputation for infected dehiscence left total knee arthroplasty. Patient states she feels much better than she did preoperatively through patient has been able to ambulate to the bathroom. Anticipate discharge to skilled nursing facility. Wound VAC has minimal drainage.

## 2017-03-21 NOTE — Progress Notes (Signed)
Physical Therapy Evaluation Patient Details Name: Brandy Houston MRN: [id number] DOB: May 08, 1956 Today's Date: 03/21/2017   History of Present Illness  Pt is a 61 y.o. female admitted on 03/10/17 with PMH significant for L knee dehiscence. Pt underwent resection, arthroplasty and placement of antibiotic spacer in L knee 32 days ago; she went for a follow-up appt and was found to have exposed subcutaneous tissue with no drainage but with persistent large joint effusion. She was hospitalized for medical optimization in preparation for possible sx. 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 Other pertinent PMH includes HTN, DM, septic arthritis, hep-C, GERD, CKD, CHF, depression, dysrhytmia, anxiety.  Clinical Impression  Patient up in chair with daughter present upon PT arrival. Patient reporting high level of pain, but willing to participate with therapy. Patient and daughter report patients ability to transfer bed <> BSC and bed <> chair with light assistance, however, not demonstrated to PT. Wound vac placement to L residual limb. Patient today able to stand at [facility] for 2 x 60 seconds with good balance and only Min Guard for general safety and immediate standing balance. PT to continue to follow acutely to maximize patients functional mobility prior to d/c.      Follow Up Recommendations SNF;Supervision/Assistance - 24 hour;DC plan and follow up therapy as arranged by surgeon    Equipment Recommendations  Other (comment) (TBD at next venue)    Recommendations for Other Services       Precautions / Restrictions Precautions Precautions: Fall Restrictions Weight Bearing Restrictions: Yes LLE Weight Bearing: Non weight bearing      Mobility  Bed Mobility                  Transfers Overall transfer level: Needs assistance Equipment used: Rolling walker (2 wheeled) Transfers: Sit to/from Stand Sit to Stand: Min guard          General transfer comment: patient able to push up from arm chair and then transfer UE to RW; PT providing Min Guard for immediate standing balance and general safety.   Ambulation/Gait                Stairs            Wheelchair Mobility    Modified Rankin (Stroke Patients Only)       Balance Overall balance assessment: Needs assistance         Standing balance support: Bilateral upper extremity supported Standing balance-Leahy Scale: Fair                               Pertinent Vitals/Pain Pain Assessment: 0-10 Pain Score: 7  Pain Location: L residual limb Pain Descriptors / Indicators: Aching;Grimacing;Guarding Pain Intervention(s): Limited activity within patient's tolerance;Monitored during session;Repositioned    Home Living Family/patient expects to be discharged to:: Private residence Living Arrangements: Children;Non-relatives/Friends;Other relatives Available Help at Discharge: Family;Available PRN/intermittently Type of Home: House Home Access: Stairs to enter Entrance Stairs-Rails: Left Entrance Stairs-Number of Steps: 3 Home Layout: Two level;Able to live on main level with bedroom/bathroom Home Equipment: Gilford Rile - 2 wheels;Wheelchair - manual;Bedside commode;Cane - single point;Walker - 4 wheels;Shower seat;Hand held shower head Additional Comments: Pt reports a ramp may be being built for her to get inside with w/c    Prior Function Level of Independence: Needs assistance  Hand Dominance        Extremity/Trunk Assessment   Upper Extremity Assessment Upper Extremity Assessment: Overall WFL for tasks assessed    Lower Extremity Assessment Lower Extremity Assessment: LLE deficits/detail LLE Deficits / Details: LLE NWB; able to accept full weight onto R LE during static stance       Communication   Communication: No difficulties  Cognition Arousal/Alertness: Awake/alert Behavior During  Therapy: WFL for tasks assessed/performed Overall Cognitive Status: Within Functional Limits for tasks assessed                                        General Comments General comments (skin integrity, edema, etc.): wound vac placement to L residual limb    Exercises     Assessment/Plan    PT Assessment Patient needs continued PT services  PT Problem List Decreased strength;Decreased activity tolerance;Decreased balance;Decreased mobility;Decreased safety awareness       PT Treatment Interventions DME instruction;Gait training;Functional mobility training;Therapeutic activities;Therapeutic exercise;Balance training    PT Goals (Current goals can be found in the Care Plan section)  Acute Rehab PT Goals Patient Stated Goal: return home PT Goal Formulation: With patient Time For Goal Achievement: 04/04/17 Potential to Achieve Goals: Good    Frequency Min 2X/week   Barriers to discharge        Co-evaluation               AM-PAC PT "6 Clicks" Daily Activity  Outcome Measure Difficulty turning over in bed (including adjusting bedclothes, sheets and blankets)?: A Lot Difficulty moving from lying on back to sitting on the side of the bed? : Unable Difficulty sitting down on and standing up from a chair with arms (e.g., wheelchair, bedside commode, etc,.)?: A Lot Help needed moving to and from a bed to chair (including a wheelchair)?: A Lot Help needed walking in hospital room?: Total Help needed climbing 3-5 steps with a railing? : Total 6 Click Score: 9    End of Session Equipment Utilized During Treatment: Gait belt Activity Tolerance: Patient tolerated treatment well Patient left: in chair;with call bell/phone within reach;with family/visitor present Nurse Communication: Mobility status PT Visit Diagnosis: Unsteadiness on feet (R26.81);Other abnormalities of gait and mobility (R26.89);Muscle weakness (generalized) (M62.81);Difficulty in walking, not  elsewhere classified (R26.2) Pain - Right/Left: Left Pain - part of body: Leg    Time: 1208-1239 PT Time Calculation (min) (ACUTE ONLY): 31 min   Charges:   PT Evaluation $PT Eval Moderate Complexity: 1 Mod PT Treatments $Therapeutic Activity: 8-22 mins    Lanney Gins, PT, DPT 03/21/17 1:07 PM

## 2017-03-21 NOTE — Progress Notes (Addendum)
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 Underwent secondary wound closure and application of wound VAC 9/27. Given multiple complications, orthopedics has recommended AKA.  Assessment/Plan Left knee wound dehiscence, s/p left total knee excision arthroplasty, placement of spacer 8/24. Per d/c summary 9/5 was set to complete abx 10/5.   -s/p left AKA 10/5. Management per ortho.  -continue IV abx for now but anticipate d/c 10/8.  Anemia of chronic disease with acute component secondary to acute illness and ABLA post-op -transfuse 2 units PRBC today -check CBC in AM  Essential hypertension -stable. Continue Norvasc, Lasix, hydralazine, metoprolol   Chronic kidney disease stage V -check BMP in AM  Diabetes mellitus type 2 -CBG stable. Continue Lantus and SSI  Hepatitis C  Consider outpatient sleep study   DVT prophylaxis: SCDs Code Status: full Family Communication: none Disposition Plan: likely SNF   Murray Hodgkins, MD  [facility] Direct contact: [phone] --Via amion app OR  --[email or web address]; password [email or web address]  7PM-7AM contact night coverage as above 03/21/2017, 11:24 AM  LOS: 11 days   Consultants:  Orthopedics   Procedures: 9/27   1. Irrigation and debridement of left knee wound dehiscence including skin, subcutaneous tissue, muscle 5 x 21 cm (105 cm)  2. Secondary wound closure of dehiscence  3. Application of wound VAC greater than 50 cm  9/29 transfusion 1 unit PRBC  10/3 transfusion 1 unit PRBC  10/4 1 unit PRBC transfusion  10/5 PROCEDURE:  LEFT ABOVE KNEE AMPUTATION  10/6 transfusion 2 units PRBC  Antimicrobials:  Ciprofloxacin  Daptomycin  Interval  history/Subjective: Hgb low post-op. PRBC transfusion.  Feels ok, continues to have pain. Requests sleep agent. Daughter noticed desats overnight, wonders about OSA.  Objective:  Vitals: afebrile, VSS, 99.0, 18, 63, 120/57, 100% on RA  Exam:    Constitutional:  Appears calm. Mildly uncomfortable in appearance. ENMT:  grossly normal hearing  Respiratory:  CTA bilaterally, no w/r/r.  Respiratory effort normal.  Cardiovascular:  RRR, no r/g. 2/6 systolic murmur. No significant RLE extremity edema   Psychiatric:  Mental status Mood, affect appropriate  I have personally reviewed the following:   Labs:  CBG stable  Hgb 7.7 >> 5.2 post op  Scheduled Meds: . amLODipine  10 mg Oral QHS  . aspirin EC  81 mg Oral Daily  . calcitRIOL  0.25 mcg Oral Q M,W,F  . ciprofloxacin  250 mg Oral Q breakfast  . docusate sodium  100 mg Oral BID  . ferrous sulfate  325 mg Oral BID WC  . furosemide  80 mg Oral BID  . gabapentin  100 mg Oral BID  . hydrALAZINE  100 mg Oral BID  . insulin aspart  0-9 Units Subcutaneous TID WC  . insulin glargine  15 Units Subcutaneous QAC breakfast  . metoprolol succinate  50 mg Oral BID  . multivitamin with minerals  1 tablet Oral Daily  . nicotine  14 mg Transdermal Daily  . pantoprazole  40 mg Oral Daily  . senna-docusate  2 tablet Oral QHS  . sertraline  100 mg Oral Daily  . sodium chloride flush  3 mL Intravenous Q12H   Continuous Infusions: . sodium chloride 0 mL (03/11/17 0700)  . sodium chloride    . sodium chloride  10 mL/hr at 03/20/17 1148  . DAPTOmycin (CUBICIN)  IV Stopped (03/20/17 1648)  . methocarbamol (ROBAXIN)  IV      Principal Problem:   Wound dehiscence Active Problems:   Essential hypertension   Uncontrolled type 2 diabetes mellitus with complication ([name])   Depression   Anemia of chronic disease   CKD (chronic kidney disease), stage V ([place])   LOS: 11 days

## 2017-03-21 NOTE — Progress Notes (Signed)
1 unit of PRBC started. Patient education done and patient informed she will let nurse know if she has chest pain, SOB, dizziness , fever, chills , back pain or any abnormal symptoms. Patient verbalizes understanding.

## 2017-03-22 LAB — GLUCOSE, CAPILLARY
Glucose-Capillary: 105 mg/dL — ABNORMAL HIGH (ref 65–99)
Glucose-Capillary: 140 mg/dL — ABNORMAL HIGH (ref 65–99)
Glucose-Capillary: 108 mg/dL — ABNORMAL HIGH (ref 65–99)
Glucose-Capillary: 148 mg/dL — ABNORMAL HIGH (ref 65–99)

## 2017-03-22 LAB — BASIC METABOLIC PANEL
Anion gap: 8 (ref 5–15)
BUN: 56 mg/dL — ABNORMAL HIGH (ref 6–20)
CO2: 22 mmol/L (ref 22–32)
Calcium: 7.7 mg/dL — ABNORMAL LOW (ref 8.9–10.3)
Chloride: 104 mmol/L (ref 101–111)
Creatinine, Ser: 3.52 mg/dL — ABNORMAL HIGH (ref 0.44–1.00)
GFR calc Af Amer: 15 mL/min — ABNORMAL LOW (ref >60)
GFR calc non Af Amer: 13 mL/min — ABNORMAL LOW (ref >60)
Glucose, Bld: 105 mg/dL — ABNORMAL HIGH (ref 65–99)
Potassium: 4.7 mmol/L (ref 3.5–5.1)
Sodium: 134 mmol/L — ABNORMAL LOW (ref 135–145)

## 2017-03-22 LAB — TYPE AND SCREEN
ABO/RH(D): A POS
Antibody Screen: NEGATIVE
Unit division: 0
Unit division: 0
Unit division: 0
Unit division: 0

## 2017-03-22 LAB — BPAM RBC
Blood Product Expiration Date: 201810102359
Blood Product Expiration Date: 201810162359
Blood Product Expiration Date: 201810162359
Blood Product Expiration Date: 201810182359
ISSUE DATE / TIME: 201810031259
ISSUE DATE / TIME: 201810041403
ISSUE DATE / TIME: 201810060615
ISSUE DATE / TIME: 201810061254
Unit Type and Rh: 600
Unit Type and Rh: 6200
Unit Type and Rh: 6200
Unit Type and Rh: 6200

## 2017-03-22 LAB — CBC
HCT: 21.5 % — ABNORMAL LOW (ref 36.0–46.0)
Hemoglobin: 6.9 g/dL — CL (ref 12.0–15.0)
MCH: 27.1 pg (ref 26.0–34.0)
MCHC: 32.1 g/dL (ref 30.0–36.0)
MCV: 84.3 fL (ref 78.0–100.0)
Platelets: 202 10*3/uL (ref 150–400)
RBC: 2.55 MIL/uL — ABNORMAL LOW (ref 3.87–5.11)
RDW: 17.1 % — ABNORMAL HIGH (ref 11.5–15.5)
WBC: 10 10*3/uL (ref 4.0–10.5)

## 2017-03-22 LAB — PREPARE RBC (CROSSMATCH)

## 2017-03-22 MED ORDER — SODIUM CHLORIDE 0.9 % IV SOLN
Freq: Once | INTRAVENOUS | Status: AC
  Administered 2017-03-22: 10:00:00 via INTRAVENOUS

## 2017-03-22 NOTE — Progress Notes (Signed)
[name], female   DOB: 1955-12-23, 61 y.o.   MRN: [id number] Physical therapy has recommended skilled nursing placement. I also reinforced the importance of patient discharging to skilled nursing. There is no drainage in the wound VAC. This will remain in place for 1 week. I will follow-up in the office in 1 week.

## 2017-03-22 NOTE — Progress Notes (Addendum)
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 Underwent secondary wound closure and application of wound VAC 9/27. Given multiple complications, orthopedics has recommended AKA which was performed 10/5. Hospitalization complicated by anemia of acute illness and ABLA requiring multiple PRBC transfusions. At this point SNF is planned, probably next 48 hours when Hgb stabilizes.  Assessment/Plan Left knee wound dehiscence, s/p left AKA 10/5. s/p left total knee excision arthroplasty, placement of spacer 8/24. Per d/c summary 9/5 was set to complete abx 10/5.   -source control achieved. Will discuss abx duration with ID. Continue abx for now. -consult CSW for SNF  Anemia of chronic disease with acute component secondary to acute illness and ABLA post-op -Hgb up appropriately with transfusion but still less than 7   -transfuse 2 additional units PRBC  Essential hypertension -stable. Continue Norvasc, Lasix, hydralazine, metoprolol.   Chronic kidney disease stage V - at baseline  Diabetes mellitus type 2 - blood sugars remain stable. Continue Lantus and sliding scale insulin.  Hepatitis C  Consider outpatient sleep study   DVT prophylaxis: SCDs Code Status: full Family Communication: none Disposition Plan: SNF   Murray Hodgkins, MD  [facility] Direct contact: [phone] --Via amion app OR  --[email or web address]; password [email or web address]  7PM-7AM contact night coverage as above 03/22/2017, 8:32 AM  LOS: 12 days   Consultants:  Orthopedics   Procedures: 9/27   1. Irrigation and debridement of left knee wound dehiscence including skin, subcutaneous tissue, muscle 5 x 21 cm (105 cm)  2. Secondary wound  closure of dehiscence  3. Application of wound VAC greater than 50 cm  9/29 transfusion 1 unit PRBC  10/3 transfusion 1 unit PRBC  10/4 1 unit PRBC transfusion  10/5 PROCEDURE:  LEFT ABOVE KNEE AMPUTATION  10/6 transfusion 2 units PRBC  Antimicrobials:  Ciprofloxacin  Daptomycin  Interval history/Subjective: Feels ok. Still has significant pain LLE. No SOB or CP.  Objective:  Vitals: afebrile, 98.6, 70, 138/72, 100% on RA  Exam:    Constitutional:  Appears calm and comfortable ENMT:  grossly normal hearing  Respiratory:  CTA bilaterally, no w/r/r.  Respiratory effort normal. N Cardiovascular:  RRR, no m/r/g No RLE extremity edema   Psychiatric:  Mental status Mood, affect appropriate  I have personally reviewed the following:   Labs:  Hgb 5.2 >> 6.9  K+ normal. Renal function stable.  Scheduled Meds: . amLODipine  10 mg Oral QHS  . calcitRIOL  0.25 mcg Oral Q M,W,F  . ciprofloxacin  250 mg Oral Q breakfast  . docusate sodium  100 mg Oral BID  . ferrous sulfate  325 mg Oral BID WC  . furosemide  80 mg Oral BID  . gabapentin  100 mg Oral BID  . hydrALAZINE  100 mg Oral BID  . insulin aspart  0-9 Units Subcutaneous TID WC  . insulin glargine  15 Units Subcutaneous QAC breakfast  . metoprolol succinate  50 mg Oral BID  . multivitamin with minerals  1 tablet Oral Daily  . nicotine  14 mg Transdermal Daily  . pantoprazole  40 mg Oral Daily  . senna-docusate  2 tablet Oral QHS  . sertraline  100 mg Oral Daily  . sodium chloride flush  3 mL Intravenous Q12H  . traZODone  50 mg Oral QHS   Continuous Infusions: . sodium chloride 0 mL (03/11/17 0700)  . sodium chloride    . sodium chloride 10 mL/hr at 03/20/17 1148  . DAPTOmycin (CUBICIN)  IV Stopped (03/20/17 1648)  . 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

## 2017-03-23 LAB — CBC
HCT: 24.9 % — ABNORMAL LOW (ref 36.0–46.0)
Hemoglobin: 8.1 g/dL — ABNORMAL LOW (ref 12.0–15.0)
MCH: 27.9 pg (ref 26.0–34.0)
MCHC: 32.5 g/dL (ref 30.0–36.0)
MCV: 85.9 fL (ref 78.0–100.0)
Platelets: 187 10*3/uL (ref 150–400)
RBC: 2.9 MIL/uL — ABNORMAL LOW (ref 3.87–5.11)
RDW: 16.3 % — ABNORMAL HIGH (ref 11.5–15.5)
WBC: 8.2 10*3/uL (ref 4.0–10.5)

## 2017-03-23 LAB — GLUCOSE, CAPILLARY
Glucose-Capillary: 123 mg/dL — ABNORMAL HIGH (ref 65–99)
Glucose-Capillary: 147 mg/dL — ABNORMAL HIGH (ref 65–99)
Glucose-Capillary: 106 mg/dL — ABNORMAL HIGH (ref 65–99)
Glucose-Capillary: 157 mg/dL — ABNORMAL HIGH (ref 65–99)

## 2017-03-23 NOTE — Progress Notes (Signed)
   03/23/17 1000  Clinical Encounter Type  Visited With Patient  Visit Type Initial  Referral From Social work  Consult/Referral To Chaplain  Spiritual Encounters  Spiritual Needs Prayer;Emotional  Stress Factors  Patient Stress Factors Health changes  Chaplain visited with the PT.  PT requested prayer as she is facing a health decision about amputation.  PT is concerned about the next chapters of her life and how they are going to be written.  PT has a strong family system , 3 kids age range 83-40.  Chaplain prayed with the PT.  PT was willing to share the struggles of life without her parents.  Her bond to family is influenced by her parent units not being around.

## 2017-03-23 NOTE — NC FL2 (Signed)
[place] LEVEL OF CARE SCREENING TOOL     IDENTIFICATION  Patient Name: Brandy Houston Birthdate: Nov 12, 1955 Sex: female Admission Date (Current Location): 03/10/2017  [facility] and [place] Number:  Herbalist and Address:  [facility], [place] [address] [place], [place] [address]      Provider Number: [id number]  Attending Physician Name and Address:  Annita Brod, MD  Relative Name and Phone Number:  Ariza Evans, [phone], daughter    Current Level of Care: Hospital Recommended Level of Care: [place] Prior Approval Number:    Date Approved/Denied: 03/13/17 PASRR Number: [id number]  Discharge Plan: SNF    Current Diagnoses: Patient Active Problem List   Diagnosis Date Noted  . 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 CKD (chronic kidney disease), stage V ([place]) 03/17/2017  . Wound dehiscence 03/10/2017  . Chronic pain of left knee 03/05/2017  . Fever 02/08/2017  . Infection of prosthetic left knee joint ([place]) 02/07/2017  . AKI (acute kidney injury) ([place])   . Hemarthrosis   . Anemia of chronic disease 02/05/2017  . Diabetes mellitus, type II ([place]) 02/05/2017  . Dysplasia of cervix, high grade CIN 2 01/12/2017  . ASCUS with positive high risk HPV cervical 12/09/2016  . Chronic diastolic CHF (congestive heart failure) ([place]) 11/08/2015  . Total knee replacement status   . Diabetic peripheral neuropathy ([place])   . Anxiety state   . Depression   . Normocytic anemia 11/07/2015  . Chronic kidney disease (CKD), stage IV (severe) ([place])   . Hepatitis C 08/16/2014  . Peripheral neuropathy 06/24/2013  . 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 Essential hypertension 02/18/2013    Orientation RESPIRATION BLADDER Height & Weight     Self, Time, Situation, Place  Normal Continent Weight: 158 lb (71.7 kg) Height:  5\' 7"  (170.2 cm)  BEHAVIORAL SYMPTOMS/MOOD NEUROLOGICAL BOWEL NUTRITION  STATUS      Continent Diet (See DC Summary)  AMBULATORY STATUS COMMUNICATION OF NEEDS Skin   Extensive Assist Verbally Surgical wounds, Wound Vac (left thigh)                       Personal Care Assistance Level of Assistance  Bathing, Feeding, Dressing Bathing Assistance: Limited assistance Feeding assistance: Independent Dressing Assistance: Limited assistance     Functional Limitations Info    Sight Info: Adequate Hearing Info: Adequate Speech Info: Adequate    SPECIAL CARE FACTORS FREQUENCY  PT (By licensed PT)     PT Frequency: 2x week              Contractures Contractures Info: Not present    Additional Factors Info  Code Status Code Status Info: Full Code Allergies Info: COMPAZINE PROCHLORPERAZINE, SHELLFISH-DERIVED PRODUCTS, IODINATED DIAGNOSTIC AGENTS, OMNIPAQUE IOHEXOL            Current Medications (03/23/2017):  This is the current hospital active medication list Current Facility-Administered Medications  Medication Dose Route Frequency Provider Last Rate Last Dose  . 0.9 %  sodium chloride infusion  250 mL Intravenous PRN Arrien, Jimmy Picket, MD 0 mL/hr at 03/11/17 0700    . 0.9 %  sodium chloride infusion   Intravenous Continuous Lyndle Herrlich, MD      . 0.9 %  sodium chloride infusion   Intravenous Continuous Newt Minion, MD 10 mL/hr at 03/20/17 1148    . acetaminophen (TYLENOL) tablet 650 mg  650 mg  Oral Q6H PRN Tawni Millers, MD   650 mg at 03/22/17 0913   Or  . acetaminophen (TYLENOL) suppository 650 mg  650 mg Rectal Q6H PRN Arrien, Jimmy Picket, MD      . amLODipine (NORVASC) tablet 10 mg  10 mg Oral QHS Tawni Millers, MD   10 mg at 03/20/17 2112  . bisacodyl (DULCOLAX) suppository 10 mg  10 mg Rectal Daily PRN Newt Minion, MD      . calcitRIOL (ROCALTROL) capsule 0.25 mcg  0.25 mcg Oral Q M,W,F Arrien, Jimmy Picket, MD   0.25 mcg at 03/23/17 0825  . calcium carbonate (TUMS - dosed in mg elemental  calcium) chewable tablet 200 mg of elemental calcium  1 tablet Oral TID PRN Arrien, Jimmy Picket, MD      . ciprofloxacin (CIPRO) tablet 250 mg  250 mg Oral Q breakfast Arrien, Jimmy Picket, MD   250 mg at 03/23/17 0827  . DAPTOmycin (CUBICIN) 420 mg in sodium chloride 0.9 % IVPB  420 mg Intravenous Q48H Romona Curls, [place]   Stopped at 03/22/17 1835  . diphenhydrAMINE (BENADRYL) capsule 25 mg  25 mg Oral Q6H PRN Arrien, Jimmy Picket, MD   25 mg at 03/21/17 1721  . docusate sodium (COLACE) capsule 100 mg  100 mg Oral BID Newt Minion, MD   100 mg at 03/23/17 0825  . ferrous sulfate tablet 325 mg  325 mg Oral BID WC Arrien, Jimmy Picket, MD   325 mg at 03/23/17 5465  . furosemide (LASIX) tablet 80 mg  80 mg Oral BID Tawni Millers, MD   80 mg at 03/23/17 0827  . gabapentin (NEURONTIN) capsule 100 mg  100 mg Oral BID Aline August, MD   100 mg at 03/23/17 0827  . hydrALAZINE (APRESOLINE) tablet 100 mg  100 mg Oral BID Tawni Millers, MD   100 mg at 03/23/17 0826  . HYDROmorphone (DILAUDID) injection 1 mg  1 mg Intravenous Q2H PRN Newt Minion, MD   1 mg at 03/23/17 0935  . insulin aspart (novoLOG) injection 0-9 Units  0-9 Units Subcutaneous TID WC Arrien, Jimmy Picket, MD   1 Units at 03/23/17 0825  . insulin glargine (LANTUS) injection 15 Units  15 Units Subcutaneous QAC breakfast Arrien, Jimmy Picket, MD   15 Units at 03/23/17 0825  . loratadine (CLARITIN) tablet 10 mg  10 mg Oral Daily PRN Aline August, MD      . magnesium citrate solution 1 Bottle  1 Bottle Oral Once PRN Newt Minion, MD      . methocarbamol (ROBAXIN) tablet 500 mg  500 mg Oral Q6H PRN Newt Minion, MD   500 mg at 03/23/17 0354   Or  . methocarbamol (ROBAXIN) 500 mg in dextrose 5 % 50 mL IVPB  500 mg Intravenous Q6H PRN Newt Minion, MD      . metoprolol succinate (TOPROL-XL) 24 hr tablet 50 mg  50 mg Oral BID Tawni Millers, MD   50 mg at 03/23/17 0827  . multivitamin  with minerals tablet 1 tablet  1 tablet Oral Daily Arrien, Jimmy Picket, MD   1 tablet at 03/23/17 [phone]  . nicotine (NICODERM CQ - dosed in mg/24 hours) patch 14 mg  14 mg Transdermal Daily Arrien, Jimmy Picket, MD   14 mg at 03/23/17 1275  . ondansetron (ZOFRAN) tablet 4 mg  4 mg Oral Q6H PRN Arrien, Jimmy Picket, MD   4 mg  at 03/11/17 1338   Or  . ondansetron (ZOFRAN) injection 4 mg  4 mg Intravenous Q6H PRN Arrien, Jimmy Picket, MD   4 mg at 03/13/17 1002  . oxyCODONE (Oxy IR/ROXICODONE) immediate release tablet 5-10 mg  5-10 mg Oral Q4H PRN Samuella Cota, MD   10 mg at 03/23/17 7482  . pantoprazole (PROTONIX) EC tablet 40 mg  40 mg Oral Daily Arrien, Jimmy Picket, MD   40 mg at 03/23/17 0824  . polyethylene glycol (MIRALAX / GLYCOLAX) packet 17 g  17 g Oral Daily PRN Newt Minion, MD      . senna-docusate (Senokot-S) tablet 2 tablet  2 tablet Oral QHS Tawni Millers, MD   2 tablet at 03/22/17 2001  . sertraline (ZOLOFT) tablet 100 mg  100 mg Oral Daily Arrien, Jimmy Picket, MD   100 mg at 03/23/17 0826  . sodium chloride flush (NS) 0.9 % injection 10-40 mL  10-40 mL Intracatheter PRN Arrien, Jimmy Picket, MD   10 mL at 03/22/17 0337  . sodium chloride flush (NS) 0.9 % injection 3 mL  3 mL Intravenous Q12H Arrien, Jimmy Picket, MD   3 mL at 03/23/17 7078  . sodium chloride flush (NS) 0.9 % injection 3 mL  3 mL Intravenous PRN Arrien, Jimmy Picket, MD   3 mL at 03/22/17 0918  . traZODone (DESYREL) tablet 50 mg  50 mg Oral QHS Samuella Cota, MD   50 mg at 03/22/17 2002     Discharge Medications: Please see discharge summary for a list of discharge medications.  Relevant Imaging Results:  Relevant Lab Results:   Additional Information SS#: [place], LCSW

## 2017-03-23 NOTE — Progress Notes (Signed)
PROGRESS NOTE  Brandy Houston UJW:[id number] DOB: 1955-12-24 DOA: 03/10/2017 PCP: [name], MD  HPI/Recap of past 19 hours: 61 year old woman PMH left TKA 2017, hemarthrosis left knee secondary to plt dysfunction, s/p left total knee excision arthroplasty 8/24, admitted directly from orthopedic office 9/25 for wound dehiscence. Underwent secondary wound closure and application of wound VAC 9/27. Given multiple complications, orthopedics has recommended AKA which was performed 10/5. Hospitalization complicated by anemia of acute illness and ABLA requiring multiple PRBC transfusions.  Patient's hemoglobin dropped to 6.9 on 10/7 requiring 1 unit transfusion packed red blood cells.  Today, she is with no complaints. Breathing comfortably. A little tired. No pain.     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 Skilled nursing facility, likely tomorrow Active Problems:   Essential hypertension: Overall blood pressure stable, continue home medications   Depression   Anemia of chronic disease: Secondary to acute illness and loss from surgery. Transfused 2 unit packed red blood cells and 10/70. 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 ([place]): CBG stable, below 150   Code Status: full code   Family Communication: left message with family   Disposition Plan: skilled nursing tomorrow    Consultants:  orthopedic  Procedures:  Blood transfusions on 9/29, 10/3, 10/4, 10/6  Left AKA done 10/5  Irrigation and debridement of left knee wound dehiscence along with wound closure and wound VAC  Antimicrobials:  By mouth Cipro 10/6-10/8  Daptomycin    DVT prophylaxis:  SCDs   Objective: Vitals:   03/22/17 1800 03/22/17 1900 03/23/17 0530 03/23/17 1433  BP: 126/66 (!) 130/53 135/63 (!)  158/68  Pulse: 63 67 66 96  Resp: 18 18 17 16   Temp:  98.7 F (37.1 C) 98.6 F (37 C) 98.7 F (37.1 C)  TempSrc: Oral Oral Oral Oral  SpO2: 100% 98% 98% 96%  Weight:      Height:        Intake/Output Summary (Last 24 hours) at 03/23/17 1515 Last data filed at 03/23/17 1433  Gross per 24 hour  Intake           1931.5 ml  Output                0 ml  Net           1931.5 ml   Filed Weights   03/12/17 1000 03/20/17 0707  Weight: 72.1 kg (158 lb 15.2 oz) 71.7 kg (158 lb)    Exam:   General:  Alert and oriented 3, no acute distress   Cardiovascular: regular rate and rhythm, F6-O1, 2/6 systolic ejection murmur   Respiratory: clear auscultation bilaterally   Abdomen: soft, nontender, nondistended, positive bowel sounds   Musculoskeletal: status post left AKA   Skin: noted left AKA, with VAC in place  Psychiatry: appropriate, no evidence of psychoses    Data Reviewed: CBC:  Recent Labs Lab 03/17/17 0421 03/18/17 0457 03/19/17 0453 03/20/17 0422 03/21/17 0340 03/22/17 0332 03/23/17 0320  WBC 6.7 6.7 5.4 5.4 8.5 10.0 8.2  NEUTROABS 4.6 4.7  --   --   --   --   --   HGB 7.0* 6.9* 7.5* 7.7* 5.2* 6.9* 8.1*  HCT 22.5* 22.1* 23.5* 23.7* 16.1* 21.5* 24.9*  MCV 86.9 86.7 86.4 85.6 84.7 84.3  85.9  PLT 237 191 227 207 209 202 732   Basic Metabolic Panel:  Recent Labs Lab 03/17/17 0421 03/18/17 0457 03/20/17 0422 03/22/17 0332  NA 136 137 135 134*  K 3.7 3.8 3.2* 4.7  CL 107 106 108 104  CO2 22 22 23 22   GLUCOSE 115* 97 95 105*  BUN 52* 54* 52* 56*  CREATININE 3.71* 3.66* 3.31* 3.52*  CALCIUM 8.5* 8.5* 8.4* 7.7*  MG 2.0 1.9  --   --    GFR: Estimated Creatinine Clearance: 16.1 mL/min (A) (by C-G formula based on SCr of 3.52 mg/dL (H)). Liver Function Tests: No results for input(s): AST, ALT, ALKPHOS, BILITOT, PROT, ALBUMIN in the last 168 hours. No results for input(s): LIPASE, AMYLASE in the last 168 hours. No results for input(s): AMMONIA in the last  168 hours. Coagulation Profile: No results for input(s): INR, PROTIME in the last 168 hours. Cardiac Enzymes:  Recent Labs Lab 03/19/17 0453  CKTOTAL 42   BNP (last 3 results) No results for input(s): PROBNP in the last 8760 hours. HbA1C: No results for input(s): HGBA1C in the last 72 hours. CBG:  Recent Labs Lab 03/22/17 1121 03/22/17 1634 03/22/17 2020 03/23/17 0704 03/23/17 1148  GLUCAP 140* 108* 148* 123* 147*   Lipid Profile: No results for input(s): CHOL, HDL, LDLCALC, TRIG, CHOLHDL, LDLDIRECT in the last 72 hours. Thyroid Function Tests: No results for input(s): TSH, T4TOTAL, FREET4, T3FREE, THYROIDAB in the last 72 hours. Anemia Panel: No results for input(s): VITAMINB12, FOLATE, FERRITIN, TIBC, IRON, RETICCTPCT in the last 72 hours. Urine analysis:    Component Value Date/Time   COLORURINE AMBER (A) 02/08/2017 1138   APPEARANCEUR TURBID (A) 02/08/2017 1138   LABSPEC 1.014 02/08/2017 1138   PHURINE 5.0 02/08/2017 1138   GLUCOSEU NEGATIVE 02/08/2017 1138   HGBUR MODERATE (A) 02/08/2017 1138   BILIRUBINUR NEGATIVE 02/08/2017 1138   KETONESUR NEGATIVE 02/08/2017 1138   PROTEINUR 100 (A) 02/08/2017 1138   UROBILINOGEN 0.2 09/02/2014 1504   NITRITE NEGATIVE 02/08/2017 1138   LEUKOCYTESUR LARGE (A) 02/08/2017 1138   Sepsis Labs: @LABRCNTIP (procalcitonin:4,lacticidven:4)  )No results found for this or any previous visit (from the past 240 hour(s)).    Studies: No results found.  Scheduled Meds: . amLODipine  10 mg Oral QHS  . calcitRIOL  0.25 mcg Oral Q M,W,F  . docusate sodium  100 mg Oral BID  . ferrous sulfate  325 mg Oral BID WC  . furosemide  80 mg Oral BID  . gabapentin  100 mg Oral BID  . hydrALAZINE  100 mg Oral BID  . insulin aspart  0-9 Units Subcutaneous TID WC  . insulin glargine  15 Units Subcutaneous QAC breakfast  . metoprolol succinate  50 mg Oral BID  . multivitamin with minerals  1 tablet Oral Daily  . nicotine  14 mg Transdermal  Daily  . pantoprazole  40 mg Oral Daily  . senna-docusate  2 tablet Oral QHS  . sertraline  100 mg Oral Daily  . sodium chloride flush  3 mL Intravenous Q12H  . traZODone  50 mg Oral QHS    Continuous Infusions: . sodium chloride 0 mL (03/11/17 0700)  . sodium chloride    . sodium chloride 10 mL/hr at 03/20/17 1148  . DAPTOmycin (CUBICIN)  IV Stopped (03/22/17 1835)  . methocarbamol (ROBAXIN)  IV       LOS: 13 days     Annita Brod, MD [facility] Pager [phone]  If 7PM-7AM, please  contact night-coverage [email or web address] Password [email or web address] 03/23/2017, 3:15 PM

## 2017-03-23 NOTE — Social Work (Signed)
CSW discussed bed offers and patient indicated that she would like to [facility].  CSW f/u with SNF on bed availability.  Elissa Hefty, LCSW Clinical Social Worker [phone]

## 2017-03-23 NOTE — Progress Notes (Signed)
Pharmacy Antibiotic Note  Brandy Houston is a 61 y.o. 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 Patient was readmitted 03/12/17 with non-healing wound- s/p I&D on 9/28 and AKA 10/5.   Patient has CKD- SCr up to 3.52 yesterday, CrCl ~15-20 ml/min.   She is afebrile and her WBC is WNL.  CK remains stable since Cubicin was started.   Plan: Cubicin 420mg  IV Q48H (~6 mg/kg) Ciprofloxacin 250 mg PO daily CK q-Thurs F/u LOT of antibiotics now that patient is s/p AKA on 10/5   Height: 5\' 7"  (170.2 cm) Weight: 158 lb (71.7 kg) IBW/kg (Calculated) : 61.6  Temp (24hrs), Avg:98.7 F (37.1 C), Min:98.4 F (36.9 C), Max:98.9 F (37.2 C)   Recent Labs Lab 03/17/17 0421 03/18/17 0457 03/19/17 0453 03/20/17 0422 03/21/17 0340 03/22/17 0332 03/23/17 0320  WBC 6.7 6.7 5.4 5.4 8.5 10.0 8.2  CREATININE 3.71* 3.66*  --  3.31*  --  3.52*  --     Estimated Creatinine Clearance: 16.1 mL/min (A) (by C-G formula based on SCr of 3.52 mg/dL (H)).    Allergies  Allergen Reactions  . Compazine [Prochlorperazine] Shortness Of Breath and Swelling    TONGUE SWELLS  . Shellfish-Derived Products Anaphylaxis  . Iodinated Diagnostic Agents Hives and Rash  . Omnipaque [Iohexol] Hives    Cubicin 8/26 >> Vanc 8/24>> 8/25 Cipro 8/27 >>  8/26 CK = 77 9/3 CK = 38 9/27 CK = 41 10/4 CK = 42  8/21 left knee aspirate - negative 8/24 left knee tissue - rare bacteroides oralis (beta lactamase +) - may be contaminant per ID 8/25 BCx - NF 8/26 UCx - >100K yeast  [name] D. Jama Krichbaum, PharmD, [place] Clinical Pharmacist Pager: [phone] Clinical Phone for 03/23/2017 until 3:30pm: [phone] If after 3:30pm, please call main pharmacy at [phone] 03/23/2017 9:00 AM

## 2017-03-23 NOTE — Care Management (Signed)
Patient  had left AKA, 03/20/17, will need shortterm rehab at SNF, has selected [facility], social worker has facilitated.

## 2017-03-23 NOTE — Care Management Important Message (Signed)
Important Message  Patient Details  Name: Brandy Houston MRN: [id number] Date of Birth: [birth year]   Medicare Important Message Given:  Yes    Nathen May 03/23/2017, 8:46 AM

## 2017-03-23 NOTE — Clinical Social Work Note (Signed)
Clinical Social Work Assessment  Patient Details  Name: Brandy Houston MRN: [id number] Date of Birth: [birth year]  Date of referral:  03/23/17               Reason for consult:  Facility Placement                Permission sought to share information with:  Chartered certified accountant granted to share information::  Yes, Verbal Permission Granted  Name::     Mabeline Caras  Agency::  [facility]  Relationship::     Contact Information:     Housing/Transportation Living arrangements for the past 2 months:  [place] of Information:  Patient Patient Interpreter Needed:  None Criminal Activity/Legal Involvement Pertinent to Current Situation/Hospitalization:  No - Comment as needed Significant Relationships:  Adult Children, Other Family Members Lives with:  Adult Children Do you feel safe going back to the place where you live?  No Need for family participation in patient care:  No (Coment)  Care giving concerns:  Pt resides at home with son. Pt now has new impairment and will need skilled nursing. Pt has not one to care for her at home as son works. Pt was recently in [facility] and and was ambivalent about returning due to having a co-pay. Pt discussed with family and they are encouraging the  SNF despite the co-pay needs. Pt in agreement and a SNF will be a safer discharge as she has no one at home to care for her.  Social Worker assessment / plan:  CSW discussed the SNF plan at discharge. CSW will continue to assist with transition to SNF. Pt prefers [facility]. CSW will f/u for bed availability. FL2 complete, passr confirmed and offer sent.  Employment status:  Disabled (Comment on whether or not currently receiving Disability) Insurance information:  Medicare PT Recommendations:  [place] / Referral to community resources:  [place]  Patient/Family's Response to care:  Patient appreciative of CSW f/u  for SNF placement. No issues or concerns identified.  Patient/Family's Understanding of and Emotional Response to Diagnosis, Current Treatment, and Prognosis:  Patient has good understanding of diagnosis, current treatment, and prognosis. Pt hopeful that she will remain with a positive attitude given her new impairment. CSW validated and supported her mood changes as a result of AKA. No other issues identified.  Emotional Assessment Appearance:  Appears stated age Attitude/Demeanor/Rapport:   (Cooperative) Affect (typically observed):  Accepting, Appropriate Orientation:  Oriented to Situation, Oriented to  Time, Oriented to Place, Oriented to Self Alcohol / Substance use:  Not Applicable Psych involvement (Current and /or in the community):  No (Comment)  Discharge Needs  Concerns to be addressed:  Care Coordination Readmission within the last 30 days:  No Current discharge risk:  None, Dependent with Mobility, Physical Impairment Barriers to Discharge:  No Barriers Identified   Normajean [place], LCSW 03/23/2017, 11:25 AM

## 2017-03-23 NOTE — Progress Notes (Signed)
Physical Therapy Treatment Patient Details Name: Brandy Houston MRN: [id number] DOB: January 16, 1956 Today's Date: 03/23/2017    History of Present Illness Pt is a 61 y.o. female admitted on 03/10/17 with PMH significant for L knee dehiscence. Pt underwent resection, arthroplasty and placement of antibiotic spacer in L knee 32 days ago; she went for a follow-up appt and was found to have exposed subcutaneous tissue with no drainage but with persistent large joint effusion. She was hospitalized for medical optimization in preparation for possible sx. 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 Other pertinent PMH includes HTN, DM, septic arthritis, hep-C, GERD, CKD, CHF, depression, dysrhytmia, anxiety.    PT Comments    Pt educated on phantom limb pain and how to manage it by desensitizing incision by rubbing it with hand and reaching down to area even if its not there. Pt with improved ambulation tolerance, limited by UE weakness/shakiness. Pt given L hip HEP. Acute PT to con't to follow.    Follow Up Recommendations  SNF;Supervision/Assistance - 24 hour;DC plan and follow up therapy as arranged by surgeon     Equipment Recommendations  Other (comment)    Recommendations for Other Services       Precautions / Restrictions Precautions Precautions: Fall Precaution Comments: wound vac to L LE, educated on importance of hip extension to prevent hip flexion contracture. Restrictions Weight Bearing Restrictions: Yes LLE Weight Bearing: Non weight bearing    Mobility  Bed Mobility Overal bed mobility: Needs Assistance Bed Mobility: Supine to Sit     Supine to sit: Supervision     General bed mobility comments: HOB elevated, used bed rail  Transfers Overall transfer level: Needs assistance Equipment used: Rolling walker (2 wheeled) Transfers: Sit to/from Stand Sit to Stand: Min guard         General transfer comment:  v/c's to push up from bed, increased time, mildly unsteady during transition of hands from bed to RW  Ambulation/Gait Ambulation/Gait assistance: Min assist Ambulation Distance (Feet): 20 Feet Assistive device: Rolling walker (2 wheeled) Gait Pattern/deviations: Step-to pattern;Decreased stride length;Trunk flexed Gait velocity: decreased Gait velocity interpretation: Below normal speed for age/gender General Gait Details: cues for sequencing, maintaining TDWB, and proximity of RW; Cues to keep RW still when advancing steps forward.  Pt fatigues with increased gait distance.  Pt putting more forefoot down and required cues to correct.  During last 15 feet of gait training patient reports increased pain.     Stairs            Wheelchair Mobility    Modified Rankin (Stroke Patients Only)       Balance Overall balance assessment: Needs assistance Sitting-balance support: Feet supported;No upper extremity supported Sitting balance-Leahy Scale: Good     Standing balance support: Bilateral upper extremity supported Standing balance-Leahy Scale: Fair Standing balance comment: reliant on B UE support in order to maintain TDWB on L LE                            Cognition Arousal/Alertness: Awake/alert Behavior During Therapy: WFL for tasks assessed/performed Overall Cognitive Status: Within Functional Limits for tasks assessed                                        Exercises Amputee Exercises Gluteal Sets: AROM;Both;10 reps;Supine  Hip Extension: AROM;Left;10 reps;Standing Hip ABduction/ADduction: AROM;Left;10 reps;Supine Hip Flexion/Marching: AROM;Left;10 reps;Supine    General Comments General comments (skin integrity, edema, etc.): wound vac to L AKA      Pertinent Vitals/Pain Pain Assessment: 0-10 Pain Score: 8  Pain Location: phantom limb pain Pain Descriptors / Indicators: Aching;Grimacing;Guarding Pain Intervention(s): Monitored  during session    Home Living                      Prior Function            PT Goals (current goals can now be found in the care plan section) Progress towards PT goals: Progressing toward goals    Frequency    Min 3X/week      PT Plan Frequency needs to be updated    Co-evaluation              AM-PAC PT "6 Clicks" Daily Activity  Outcome Measure  Difficulty turning over in bed (including adjusting bedclothes, sheets and blankets)?: A Little Difficulty moving from lying on back to sitting on the side of the bed? : A Little Difficulty sitting down on and standing up from a chair with arms (e.g., wheelchair, bedside commode, etc,.)?: A Little Help needed moving to and from a bed to chair (including a wheelchair)?: A Little Help needed walking in hospital room?: A Lot Help needed climbing 3-5 steps with a railing? : A Lot 6 Click Score: 16    End of Session Equipment Utilized During Treatment: Gait belt Activity Tolerance: Patient tolerated treatment well Patient left: in chair;with call bell/phone within reach;with family/visitor present Nurse Communication: Mobility status PT Visit Diagnosis: Unsteadiness on feet (R26.81);Other abnormalities of gait and mobility (R26.89);Muscle weakness (generalized) (M62.81);Difficulty in walking, not elsewhere classified (R26.2) Pain - Right/Left: Left Pain - part of body: Leg     Time: 1020-1049 PT Time Calculation (min) (ACUTE ONLY): 29 min  Charges:  $Gait Training: 8-22 mins $Therapeutic Exercise: 8-22 mins                    G Codes:       Kittie Plater, PT, DPT Pager #: [phone] Office #: [phone]    [place] 03/23/2017, 1:07 PM

## 2017-03-24 DIAGNOSIS — G8918 Other acute postprocedural pain: Secondary | ICD-10-CM

## 2017-03-24 DIAGNOSIS — I10 Essential (primary) hypertension: Secondary | ICD-10-CM

## 2017-03-24 DIAGNOSIS — E118 Type 2 diabetes mellitus with unspecified complications: Secondary | ICD-10-CM

## 2017-03-24 DIAGNOSIS — D62 Acute posthemorrhagic anemia: Secondary | ICD-10-CM

## 2017-03-24 DIAGNOSIS — D72829 Elevated white blood cell count, unspecified: Secondary | ICD-10-CM

## 2017-03-24 DIAGNOSIS — IMO0002 Reserved for concepts with insufficient information to code with codable children: Secondary | ICD-10-CM

## 2017-03-24 DIAGNOSIS — E1165 Type 2 diabetes mellitus with hyperglycemia: Secondary | ICD-10-CM

## 2017-03-24 DIAGNOSIS — S78119A Complete traumatic amputation at level between unspecified hip and knee, initial encounter: Secondary | ICD-10-CM

## 2017-03-24 DIAGNOSIS — D638 Anemia in other chronic diseases classified elsewhere: Secondary | ICD-10-CM

## 2017-03-24 DIAGNOSIS — Z89612 Acquired absence of left leg above knee: Secondary | ICD-10-CM

## 2017-03-24 DIAGNOSIS — N186 End stage renal disease: Secondary | ICD-10-CM

## 2017-03-24 DIAGNOSIS — N185 Chronic kidney disease, stage 5: Secondary | ICD-10-CM

## 2017-03-24 DIAGNOSIS — E1122 Type 2 diabetes mellitus with diabetic chronic kidney disease: Secondary | ICD-10-CM

## 2017-03-24 DIAGNOSIS — I5032 Chronic diastolic (congestive) heart failure: Secondary | ICD-10-CM

## 2017-03-24 LAB — CBC
HCT: 23.2 % — ABNORMAL LOW (ref 36.0–46.0)
Hemoglobin: 7.5 g/dL — ABNORMAL LOW (ref 12.0–15.0)
MCH: 27.6 pg (ref 26.0–34.0)
MCHC: 32.3 g/dL (ref 30.0–36.0)
MCV: 85.3 fL (ref 78.0–100.0)
Platelets: 236 K/uL (ref 150–400)
RBC: 2.72 MIL/uL — ABNORMAL LOW (ref 3.87–5.11)
RDW: 16.2 % — ABNORMAL HIGH (ref 11.5–15.5)
WBC: 12.4 K/uL — ABNORMAL HIGH (ref 4.0–10.5)

## 2017-03-24 LAB — GLUCOSE, CAPILLARY
Glucose-Capillary: 133 mg/dL — ABNORMAL HIGH (ref 65–99)
Glucose-Capillary: 126 mg/dL — ABNORMAL HIGH (ref 65–99)
Glucose-Capillary: 115 mg/dL — ABNORMAL HIGH (ref 65–99)
Glucose-Capillary: 151 mg/dL — ABNORMAL HIGH (ref 65–99)

## 2017-03-24 LAB — BASIC METABOLIC PANEL WITH GFR
Anion gap: 10 (ref 5–15)
BUN: 59 mg/dL — ABNORMAL HIGH (ref 6–20)
CO2: 21 mmol/L — ABNORMAL LOW (ref 22–32)
Calcium: 8.1 mg/dL — ABNORMAL LOW (ref 8.9–10.3)
Chloride: 104 mmol/L (ref 101–111)
Creatinine, Ser: 3.55 mg/dL — ABNORMAL HIGH (ref 0.44–1.00)
GFR calc Af Amer: 15 mL/min — ABNORMAL LOW
GFR calc non Af Amer: 13 mL/min — ABNORMAL LOW
Glucose, Bld: 146 mg/dL — ABNORMAL HIGH (ref 65–99)
Potassium: 4.6 mmol/L (ref 3.5–5.1)
Sodium: 135 mmol/L (ref 135–145)

## 2017-03-24 LAB — PREPARE RBC (CROSSMATCH)

## 2017-03-24 MED ORDER — SODIUM CHLORIDE 0.9 % IV SOLN
Freq: Once | INTRAVENOUS | Status: AC
  Administered 2017-03-24: 10:00:00 via INTRAVENOUS

## 2017-03-24 MED ORDER — FUROSEMIDE 10 MG/ML IJ SOLN
20.0000 mg | Freq: Once | INTRAMUSCULAR | Status: AC
  Administered 2017-03-24: 20 mg via INTRAVENOUS
  Filled 2017-03-24: qty 2

## 2017-03-24 NOTE — Progress Notes (Signed)
I met with pt at bedside to discuss her preference for rehab venues. She has previously been to [facility] for 20 days. I discussed inpt rehab goals and expectations. She would like to discuss with her son. I will verify bed availability over the next 24- 48 hrs and follow up tomorrow. RN CM and SW are aware. [phone] 

## 2017-03-24 NOTE — Progress Notes (Signed)
Physical Therapy Treatment Patient Details Name: Brandy Houston MRN: [id number] DOB: 02-Jan-1956 Today's Date: 03/24/2017    History of Present Illness Pt is a 61 y.o. female admitted on 03/10/17 with PMH significant for L knee dehiscence. Pt underwent resection, arthroplasty and placement of antibiotic spacer in L knee 32 days ago; she went for a follow-up appt and was found to have exposed subcutaneous tissue with no drainage but with persistent large joint effusion. She was hospitalized for medical optimization in preparation for possible sx. 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 Other pertinent PMH includes HTN, DM, septic arthritis, hep-C, GERD, CKD, CHF, depression, dysrhytmia, anxiety.   PT Comments    Pt continues to make good progress and is motivated to participate with therapy. Able to transfer and amb short distances with RW and min guard for balance; remains limited by LE and BUE fatigue, requiring intermittent standing rest breaks. Educ on positioning to decrease risk for hip flexion contractures, in addition to wound desensitization. Pt reports decreased phantom limb pain today. Feel pt would be a good candidate for CIR, including motivation and amount of family support (CIR currently following). Will continue to follow acutely.    Follow Up Recommendations  CIR;Supervision/Assistance - 24 hour     Equipment Recommendations  None recommended by PT (defer to next venue)    Recommendations for Other Services       Precautions / Restrictions Precautions Precautions: Fall Precaution Comments: LLE wound vac; educ on positioning Restrictions Weight Bearing Restrictions: Yes LLE Weight Bearing: Non weight bearing    Mobility  Bed Mobility               General bed mobility comments: Received sitting in chair  Transfers Overall transfer level: Needs assistance Equipment used: Rolling walker (2  wheeled) Transfers: Sit to/from Stand Sit to Stand: Min guard         General transfer comment: Stood with RW and min guard for balance due to mild unsteadiness with transition of hands from chair to RW. Good technique for hand placement  Ambulation/Gait Ambulation/Gait assistance: Min guard Ambulation Distance (Feet): 30 Feet Assistive device: Rolling walker (2 wheeled) Gait Pattern/deviations: Trunk flexed;Antalgic Gait velocity: Decreased Gait velocity interpretation: <1.8 ft/sec, indicative of risk for recurrent falls General Gait Details: Amb to hallway with RW and min guard for balance, with good technique for hop-to pattern; cues for staying closer to RW to decrease fall risk. Pt with increasing fatigue of BUEs, requiring seated rest break. Declining further mobility secondary to BLE pain   Stairs            Wheelchair Mobility    Modified Rankin (Stroke Patients Only)       Balance Overall balance assessment: Needs assistance Sitting-balance support: Feet supported;No upper extremity supported Sitting balance-Leahy Scale: Good     Standing balance support: Bilateral upper extremity supported Standing balance-Leahy Scale: Poor Standing balance comment: Reliant on BUE support                            Cognition Arousal/Alertness: Awake/alert Behavior During Therapy: WFL for tasks assessed/performed Overall Cognitive Status: Within Functional Limits for tasks assessed                                        Exercises General Exercises -  Lower Extremity Ankle Circles/Pumps: AROM;Right;10 reps;Seated Long Arc Quad: AROM;Right;10 reps;Seated Straight Leg Raises: AROM;Left;10 reps;Right;5 reps;Seated (reports increased pain)    General Comments        Pertinent Vitals/Pain Pain Assessment: Faces Faces Pain Scale: Hurts whole lot Pain Location: BLE pain Pain Descriptors / Indicators: Aching;Grimacing;Guarding Pain  Intervention(s): Monitored during session    Home Living                      Prior Function            PT Goals (current goals can now be found in the care plan section) Acute Rehab PT Goals Patient Stated Goal: Receive IP rehab before returning home PT Goal Formulation: With patient Time For Goal Achievement: 04/04/17 Potential to Achieve Goals: Good Progress towards PT goals: Progressing toward goals    Frequency    Min 3X/week      PT Plan Discharge plan needs to be updated    Co-evaluation              AM-PAC PT "6 Clicks" Daily Activity  Outcome Measure  Difficulty turning over in bed (including adjusting bedclothes, sheets and blankets)?: A Little Difficulty moving from lying on back to sitting on the side of the bed? : A Little Difficulty sitting down on and standing up from a chair with arms (e.g., wheelchair, bedside commode, etc,.)?: A Little Help needed moving to and from a bed to chair (including a wheelchair)?: A Little Help needed walking in hospital room?: A Lot Help needed climbing 3-5 steps with a railing? : A Lot 6 Click Score: 16    End of Session Equipment Utilized During Treatment: Gait belt Activity Tolerance: Patient tolerated treatment well;Patient limited by pain Patient left: in chair;with call bell/phone within reach Nurse Communication: Mobility status PT Visit Diagnosis: Unsteadiness on feet (R26.81);Other abnormalities of gait and mobility (R26.89);Muscle weakness (generalized) (M62.81);Difficulty in walking, not elsewhere classified (R26.2) Pain - Right/Left: Left Pain - part of body: Leg     Time: 7494-4967 PT Time Calculation (min) (ACUTE ONLY): 20 min  Charges:  $Gait Training: 8-22 mins                    G Codes:      Mabeline Caras, PT, DPT Acute Rehab Services  Pager: [place] 03/24/2017, 3:50 PM

## 2017-03-24 NOTE — Consult Note (Signed)
Physical Medicine and Rehabilitation Consult  Reason for Consult: L-AKA due to septic knee  Referring Physician: Dr. Sharol Given   HPI: Brandy Houston is a 61 y.o. female with history of diastolic CHF, F6BW, CKD- V, septic arthritis left knee s/p  I and D with antibiotic therapy but continued to poor wound healing who was admitted on 03/10/17 from [facility] with dehiscence of wound. History taken from chart review. She failed attempts at limb salvage and required L-AKA on 10/5 by Dr. Sharol Given.  Post op has had issues with dizziness as well as acute on chronic anemia requiring transfusions, currently receiving a transfusion.  Therapy ongoing with patient making good progress. She has had significant decline in mobility and ability to carryout ALDs. CIR recommended by MD for follow up therapy.     Review of Systems  Constitutional: Negative for chills and fever.  HENT: Negative for hearing loss and tinnitus.   Eyes: Negative for blurred vision and double vision.  Respiratory: Negative for cough and shortness of breath.   Cardiovascular: Negative for chest pain and palpitations.  Gastrointestinal: Negative for abdominal pain, heartburn and nausea.  Genitourinary: Negative for dysuria and urgency.  Musculoskeletal: Positive for joint pain. Negative for myalgias.  Skin: Negative for rash.  Neurological: Positive for sensory change (sharp/stinging pain Left amp site), focal weakness and weakness. Negative for dizziness and headaches.  Psychiatric/Behavioral: Negative for memory loss. The patient does not have insomnia.   All other systems reviewed and are negative.    Past Medical History:  Diagnosis Date  . Anemia   . Anxiety   . Arthritis    "in my joints" (03/10/2017)  . Chronic diastolic CHF (congestive heart failure) ([place])   . CKD (chronic kidney disease), stage IV ([place])    stage IV. previous HD, none currently 10/30/15 (confirmed 02/05/2017 & 03/10/2017)  . Depression    Chronic  .  Diabetic peripheral neuropathy ([place])   . Dysrhythmia    tachycardia, normal ECHO 08-09-14  . GERD (gastroesophageal reflux disease)   . Heart murmur     dx'd 02/05/2017  . Hepatitis C    "tx'd in 2016; I'm negative now" (02/05/2017)  . History of blood transfusion 2017   "w/knee replacement"  . Hypertension   . Osteoarthritis of left knee   . Peripheral neuropathy   . Protein calorie malnutrition ([place])   . Septic arthritis ([place])   . Slow transit constipation   . Type II diabetes mellitus ([name])    IDDM  . Uncontrolled hypertension 02/18/2013  . Unsteady gait     Past Surgical History:  Procedure Laterality Date  . AMPUTATION Left 03/20/2017   Procedure: LEFT ABOVE KNEE AMPUTATION;  Surgeon: Newt Minion, MD;  Location: [place];  Service: Orthopedics;  Laterality: Left;  . APPENDECTOMY    . AV FISTULA PLACEMENT Left 09/13/2014   Procedure: Brachial Artery to Brachial Vein Gortex Four - Seven Stretch GRAFT INSERTION Left Forearm;  Surgeon: Mal Misty, MD;  Location: [place];  Service: Vascular;  Laterality: Left;  . CHOLECYSTECTOMY OPEN    . COLON SURGERY    . EXCISIONAL TOTAL KNEE ARTHROPLASTY WITH ANTIBIOTIC SPACERS Left 02/06/2017   Procedure: Incisional total iknee with antibiotic spacer ;  Surgeon: Leandrew Koyanagi, MD;  Location: [place];  Service: Orthopedics;  Laterality: Left;  . IR FLUORO GUIDE CV LINE RIGHT  02/10/2017  . IR US GUIDE VASC ACCESS RIGHT  02/10/2017  . IRRIGATION AND  DEBRIDEMENT KNEE Left 03/12/2017   Procedure: IRRIGATION AND DEBRIDEMENT LEFT KNEE WITH WOUND VAC APPLICATION;  Surgeon: Leandrew Koyanagi, MD;  Location: [place];  Service: Orthopedics;  Laterality: Left;  . JOINT REPLACEMENT    . KNEE ARTHROSCOPY Right 08/10/2014   Procedure: ARTHROSCOPY I & D KNEE;  Surgeon: Marianna Payment, MD;  Location: [facility];  Service: Orthopedics;  Laterality: Right;  . KNEE ARTHROSCOPY Left 08/11/2014   Procedure: ARTHROSCOPIC WASHOUT LEFT KNEE;  Surgeon: Marianna Payment, MD;   Location: [place];  Service: Orthopedics;  Laterality: Left;  . KNEE ARTHROSCOPY Left 08/19/2014   Procedure: ARTHROSCOPIC WASHOUT LEFT KNEE;  Surgeon: Leandrew Koyanagi, MD;  Location: [place];  Service: Orthopedics;  Laterality: Left;  . KNEE ARTHROSCOPY WITH LATERAL MENISECTOMY Left 04/04/2015   Procedure: AND PARTIAL LATERAL MENISECTOMY;  Surgeon: Leandrew Koyanagi, MD;  Location: [place];  Service: Orthopedics;  Laterality: Left;  . KNEE ARTHROSCOPY WITH MEDIAL MENISECTOMY Left 04/04/2015   Procedure: LEFT KNEE ARTHROSCOPY WITH PARTIAL MEDIAL MENISCECTOMY  AND SYNOVECTOMY;  Surgeon: Leandrew Koyanagi, MD;  Location: [place];  Service: Orthopedics;  Laterality: Left;  . SHOULDER ARTHROSCOPY Bilateral 08/10/2014   Procedure: I & D BILATERAL SHOULDERS ;  Surgeon: Marianna Payment, MD;  [facility];  Service: Orthopedics;  Laterality: Bilateral;  . SMALL INTESTINE SURGERY     Due to Small Bowel Obstruction; "fixed it when they did my gallbladder OR"  . TEE WITHOUT CARDIOVERSION N/A 08/14/2014   Procedure: TRANSESOPHAGEAL ECHOCARDIOGRAM (TEE);  Surgeon: Thayer Headings, MD;  Location: [place];  Service: Cardiovascular;  Laterality: N/A;  . TENOSYNOVECTOMY Right 08/11/2014   Procedure: RIGHT WRIST IRRIGATION AND DEBRIDEMENT, TENOSYNOVECTOMY;  Surgeon: Marianna Payment, MD;  Location: [place];  Service: Orthopedics;  Laterality: Right;  . TOTAL KNEE ARTHROPLASTY Left 11/07/2015   Procedure: LEFT TOTAL KNEE ARTHROPLASTY WITH REVISION OF IMPLANTS;  Surgeon: Leandrew Koyanagi, MD;  Location: [place];  Service: Orthopedics;  Laterality: Left;  . TUBAL LIGATION      Family History  Problem Relation Age of Onset  . Cancer Mother   . Heart disease Father   . Cancer Sister   . Hypertension Sister   . Cancer Brother   . Diabetes Brother   . Hypertension Brother     Social History:  Lives with son(he works days). Disabled CNA--independent and was managing home prior to knee  surgeries past month.  She reports that she has been smoking Cigarette--down to1/2 PPD in the past year.   She has a 17.50 pack-year smoking history. She has never used smokeless tobacco. She reports that she drinks alcohol. She reports that she does not use drugs.    Allergies  Allergen Reactions  . Compazine [Prochlorperazine] Shortness Of Breath and Swelling    TONGUE SWELLS  . Shellfish-Derived Products Anaphylaxis  . Iodinated Diagnostic Agents Hives and Rash  . Omnipaque [Iohexol] Hives    Medications Prior to Admission  Medication Sig Dispense Refill  . acetaminophen (TYLENOL) 500 MG tablet Take 1,000 mg by mouth every 6 (six) hours as needed for mild pain.    [name] amLODipine (NORVASC) 10 MG tablet Take 10 mg by mouth at bedtime.     [name] aspirin EC 81 MG tablet Take 81 mg by mouth daily.    . calcitRIOL (ROCALTROL) 0.25 MCG capsule Take 0.25 mcg by mouth every Monday, Wednesday, and Friday.    . calcium carbonate (TUMS EX) 750  MG chewable tablet Chew 1 tablet by mouth 3 (three) times daily as needed for heartburn.     . ciprofloxacin (CIPRO) 250 MG tablet Take 1 tablet (250 mg total) by mouth daily. 35 tablet 0  . daptomycin (CUBICIN) IVPB Inject 420 mg into the vein every other day. Indication:  Left prosthetic knee infection Last Day of Therapy:  03/20/17 Labs - Once weekly:  CBC/D, BMP, and CPK Labs - Every other week:  ESR and CRP 17 Units 0  . Diclofenac Sodium 2 % SOLN Place 2 g onto the skin 2 (two) times daily as needed. (Patient taking differently: Place 2 g onto the skin 2 (two) times daily as needed (for arthritis). ) 1 Bottle 5  . diphenhydrAMINE (BENADRYL) 25 mg capsule Take 1 capsule (25 mg total) by mouth every 6 (six) hours as needed for itching. 30 capsule 0  . ferrous sulfate 325 (65 FE) MG tablet Take 325 mg by mouth 2 (two) times daily with a meal.    . furosemide (LASIX) 80 MG tablet Take 1 tablet (80 mg total) by mouth 2 (two) times daily. 60 tablet 0  . gabapentin  (NEURONTIN) 100 MG capsule Take 1 capsule (100 mg total) by mouth 2 (two) times daily. 60 capsule 2  . hydrALAZINE (APRESOLINE) 100 MG tablet Take 100 mg by mouth 2 (two) times daily.     . insulin glargine (LANTUS) 100 UNIT/ML injection Inject 15 Units into the skin daily before breakfast.     . methocarbamol (ROBAXIN) 500 MG tablet TAKE 1 TABLET BY MOUTH EVERY 6 HOURS AS NEEDED FOR MUSCLE SPASM (Patient taking differently: TAKE 500 MG BY MOUTH EVERY 6 HOURS AS NEEDED FOR MUSCLE SPASM) 30 tablet 2  . metoprolol succinate (TOPROL-XL) 50 MG 24 hr tablet Take 50 mg by mouth 2 (two) times daily.     . Multiple Vitamin (MULTIVITAMIN WITH MINERALS) TABS tablet Take 1 tablet by mouth daily.    . nicotine (NICODERM CQ - DOSED IN MG/24 HOURS) 14 mg/24hr patch Place 1 patch (14 mg total) onto the skin daily. 28 patch 0  . omeprazole (PRILOSEC) 20 MG capsule Take 20 mg by mouth daily before breakfast.     . oxyCODONE (OXY IR/ROXICODONE) 5 MG immediate release tablet Take 5-10 mg by mouth every 6 (six) hours as needed for severe pain.    [name] Potassium Chloride ER 20 MEQ TBCR Take 20 mEq by mouth 2 (two) times daily.     . sennosides-docusate sodium (SENOKOT-S) 8.6-50 MG tablet Take 2 tablets by mouth at bedtime.    . sertraline (ZOLOFT) 100 MG tablet Take 1 tablet (100 mg total) by mouth daily. 30 tablet 6  . traMADol (ULTRAM) 50 MG tablet Take 1-2 tablets (50-100 mg total) by mouth every 6 (six) hours as needed. (Patient taking differently: Take 50-100 mg by mouth every 6 (six) hours as needed for moderate pain or severe pain. ) 60 tablet 0  . Blood Glucose Monitoring Suppl (ACCU-CHEK AVIVA PLUS) W/DEVICE KIT Check sugars TID for E11.65 (Patient not taking: Reported on 03/11/2017) 1 kit 0  . Darbepoetin Alfa (ARANESP) 100 MCG/0.5ML SOSY injection Inject 100 mcg into the skin every 30 (thirty) days.    [name] enoxaparin (LOVENOX) 30 MG/0.3ML injection Inject 0.3 mLs (30 mg total) into the skin daily. (Patient not  taking: Reported on 03/12/2017) 4.2 mL 0  . glucose blood (ACCU-CHEK AVIVA) test strip Check sugars TID for E11.65 (Patient not taking: Reported on 03/11/2017) 100  each 12  . Lancet Devices (ACCU-CHEK SOFTCLIX) lancets Check sugars TID for E11.65 (Patient not taking: Reported on 03/11/2017) 1 each 10    Home: Home Living Family/patient expects to be discharged to:: Private residence Living Arrangements: Children, Non-relatives/Friends, Other relatives Available Help at Discharge: Family, Available PRN/intermittently Type of Home: House Home Access: Stairs to enter Technical brewer of Steps: 3 Entrance Stairs-Rails: Left Home Layout: Two level, Able to live on main level with bedroom/bathroom Bathroom Shower/Tub: Multimedia programmer: Ruth: Environmental consultant - 2 wheels, Wheelchair - manual, Bedside commode, Cane - single point, Environmental consultant - 4 wheels, Shower seat, Hand held shower head Additional Comments: Pt reports a ramp may be being built for her to get inside with w/c  Functional History: Prior Function Level of Independence: Needs assistance Functional Status:  Mobility: Bed Mobility Overal bed mobility: Needs Assistance Bed Mobility: Supine to Sit Supine to sit: Supervision General bed mobility comments: HOB elevated, used bed rail Transfers Overall transfer level: Needs assistance Equipment used: Rolling walker (2 wheeled) Transfers: Sit to/from Stand Sit to Stand: Min guard General transfer comment: v/c's to push up from bed, increased time, mildly unsteady during transition of hands from bed to RW Ambulation/Gait Ambulation/Gait assistance: Min assist Ambulation Distance (Feet): 20 Feet Assistive device: Rolling walker (2 wheeled) Gait Pattern/deviations: Step-to pattern, Decreased stride length, Trunk flexed General Gait Details: cues for sequencing, maintaining TDWB, and proximity of RW; Cues to keep RW still when advancing steps forward.  Pt  fatigues with increased gait distance.  Pt putting more forefoot down and required cues to correct.  During last 15 feet of gait training patient reports increased pain.   Gait velocity: decreased Gait velocity interpretation: Below normal speed for age/gender    ADL:    Cognition: Cognition Overall Cognitive Status: Within Functional Limits for tasks assessed Orientation Level: Oriented X4 Cognition Arousal/Alertness: Awake/alert Behavior During Therapy: WFL for tasks assessed/performed Overall Cognitive Status: Within Functional Limits for tasks assessed   Blood pressure (!) 141/70, pulse 80, temperature 99.3 F (37.4 C), temperature source Oral, resp. rate 16, height '5\' 7"'  (1.702 m), weight 71.7 kg (158 lb), last menstrual period 03/16/2002, SpO2 99 %. Physical Exam  Vitals reviewed. Constitutional: She is oriented to person, place, and time. She appears well-developed and well-nourished.  HENT:  Head: Normocephalic and atraumatic.  Mouth/Throat: Oropharynx is clear and moist.  Eyes: Pupils are equal, round, and reactive to light. EOM are normal. Right eye exhibits no discharge. Left eye exhibits no discharge.  Neck: Normal range of motion. Neck supple.  Cardiovascular: Normal rate and regular rhythm.   Respiratory: Effort normal and breath sounds normal. No stridor. She has no wheezes.  GI: Soft. Bowel sounds are normal. She exhibits no distension. There is no tenderness.  Musculoskeletal: She exhibits tenderness. She exhibits no edema.  Neurological: She is alert and oriented to person, place, and time.  Sensory deficits RLE Motor: RUE: 4-/5 proximal to distal LUE: 4/5 proximal to distal LLE: 3+/5 proximal to distal (pain inhibition) RLE: HF limited by pain  Skin: Skin is warm and dry. She is not diaphoretic.  +VAC LLE  Psychiatric: Her behavior is normal. Judgment and thought content normal. Her affect is blunt.    Results for orders placed or performed during the  hospital encounter of 03/10/17 (from the past 24 hour(s))  Glucose, capillary     Status: Abnormal   Collection Time: 03/23/17 11:48 AM  Result Value Ref Range   Glucose-Capillary 147 (  H) 65 - 99 mg/dL  Glucose, capillary     Status: Abnormal   Collection Time: 03/23/17  4:50 PM  Result Value Ref Range   Glucose-Capillary 106 (H) 65 - 99 mg/dL  Glucose, capillary     Status: Abnormal   Collection Time: 03/23/17  9:28 PM  Result Value Ref Range   Glucose-Capillary 157 (H) 65 - 99 mg/dL  Basic metabolic panel     Status: Abnormal   Collection Time: 03/24/17  4:11 AM  Result Value Ref Range   Sodium 135 135 - 145 mmol/L   Potassium 4.6 3.5 - 5.1 mmol/L   Chloride 104 101 - 111 mmol/L   CO2 21 (L) 22 - 32 mmol/L   Glucose, Bld 146 (H) 65 - 99 mg/dL   BUN 59 (H) 6 - 20 mg/dL   Creatinine, Ser 3.55 (H) 0.44 - 1.00 mg/dL   Calcium 8.1 (L) 8.9 - 10.3 mg/dL   GFR calc non Af Amer 13 (L) >60 mL/min   GFR calc Af Amer 15 (L) >60 mL/min   Anion gap 10 5 - 15  CBC     Status: Abnormal   Collection Time: 03/24/17  4:11 AM  Result Value Ref Range   WBC 12.4 (H) 4.0 - 10.5 K/uL   RBC 2.72 (L) 3.87 - 5.11 MIL/uL   Hemoglobin 7.5 (L) 12.0 - 15.0 g/dL   HCT 23.2 (L) 36.0 - 46.0 %   MCV 85.3 78.0 - 100.0 fL   MCH 27.6 26.0 - 34.0 pg   MCHC 32.3 30.0 - 36.0 g/dL   RDW 16.2 (H) 11.5 - 15.5 %   Platelets 236 150 - 400 K/uL  Glucose, capillary     Status: Abnormal   Collection Time: 03/24/17  6:16 AM  Result Value Ref Range   Glucose-Capillary 133 (H) 65 - 99 mg/dL  Prepare RBC     Status: None   Collection Time: 03/24/17  8:48 AM  Result Value Ref Range   Order Confirmation ORDER PROCESSED BY BLOOD BANK    No results found.  Assessment/Plan: Diagnosis: Left AKA Labs independently reviewed.  Records reviewed and summated above. Clean amputation daily with soap and water Monitor incision site for signs of infection or impending skin breakdown. Staples to remain in place for 3-4  weeks Stump shrinker, for edema control after d/c VAC Scar mobilization massaging to prevent soft tissue adherence Stump protector during therapies Prevent flexion contractures by implementing the following:   Encourage prone lying for 20-30 mins per day BID to avoid hip flexion  Contractures if medically appropriate;  Avoid prolonged sitting Post surgical pain control with oral medication Phantom limb pain control with physical modalities including desensitization techniques (gentle self massage to the residual stump,hot packs if sensation intact, [place]) and mirror therapy, TENS. If ineffective, consider pharmacological treatment for neuropathic pain (e.g gabapentin, pregabalin, amytriptalyine, duloxetine).  When using wheelchair, patient should have knee on amputated side fully extended with board under the seat cushion. Avoid injury to contralateral side  1. Does the need for close, 24 hr/day medical supervision in concert with the patient's rehab needs make it unreasonable for this patient to be served in a less intensive setting? Yes  2. Co-Morbidities requiring supervision/potential complications: acute on chronic anemia (transfuse as necessary (curently receiving transfusion) to ensure appropriate perfusion for increased activity tolerance), diastolic CHF (monitor for signs/symptoms of fluid overload), T2 DM (Monitor in accordance with exercise and adjust meds as necessary), CKD V (avoid nephrotoxic meds),  post-op pain (Biofeedback training with therapies to help reduce reliance on opiate pain medications, particularly IV dilaudid, monitor pain control during therapies, and sedation at rest and titrate to maximum efficacy to ensure participation and gains in therapies), leukocytosis (cont to monitor for signs and symptoms of infection, further workup if indicated) 3. Due to safety, skin/wound care, disease management, pain management and patient education, does the patient require 24 hr/day rehab  nursing? Yes 4. Does the patient require coordinated care of a physician, rehab nurse, PT (1-2 hrs/day, 5 days/week) and OT (1-2 hrs/day, 5 days/week) to address physical and functional deficits in the context of the above medical diagnosis(es)? Yes Addressing deficits in the following areas: balance, endurance, locomotion, strength, transferring, bathing, dressing, toileting and psychosocial support 5. Can the patient actively participate in an intensive therapy program of at least 3 hrs of therapy per day at least 5 days per week? No 6. The potential for patient to make measurable gains while on inpatient rehab is good 7. Anticipated functional outcomes upon discharge from inpatient rehab are supervision and min assist  with PT, supervision and min assist with OT, n/a with SLP. 8. Estimated rehab length of stay to reach the above functional goals is: 16-19 days. 9. Anticipated D/C setting: Other 10. Anticipated post D/C treatments: SNF 11. Overall Rehab/Functional Prognosis: good  RECOMMENDATIONS: This patient's condition is appropriate for continued rehabilitative care in the following setting: Pt states she would like to go to [facility].  Further, pt unable to tolerate 3 hours therapy/day at present due to pain and weakness.  Will cont to follow for progress. Patient has agreed to participate in recommended program. Potentially Note that insurance prior authorization may be required for reimbursement for recommended care.  Comment: Rehab Admissions Coordinator to follow up.  Delice Lesch, MD, ABPMR [place] [place], [place] 03/24/2017

## 2017-03-24 NOTE — Progress Notes (Signed)
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 Underwent secondary wound closure and application of wound VAC 9/27. Given multiple complications, orthopedics has recommended AKA which was performed 10/5. Hospitalization complicated by anemia of acute illness and ABLA requiring multiple PRBC transfusions.  Patient's hemoglobin dropped to 6.9 on 10/7 requiring 1 unit transfusion packed red blood cells.  Today, patient doing okay. Some pain in her left leg. Hemoglobin dropped to 7.5.  Spoke with infectious disease who recommended antibiotics no longer needed.   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 Patient may benefit better from inpatient rehabilitation versus skilled nursing, [facility] inpatient rehabilitation consulted Active Problems:   Essential hypertension: Overall blood pressure stable, continue home medications   Depression   Anemia of chronic disease: Secondary to acute illness and loss from surgery. Transfused 2 unit packed red blood cells on 10/7. 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): CBG stable, below 150   Code Status: full code   Family Communication: left message with family   Disposition Plan: CIR versus skilled nursing   Consultants:  orthopedic  Procedures:  Blood transfusions on 9/29, 10/3,  10/4, 10/7, 10/9  Left AKA done 10/5  Irrigation and debridement of left knee wound dehiscence along with wound closure and wound VAC  Antimicrobials:  By mouth Cipro 10/6-10/8  Daptomycin  9/25-10/9  DVT prophylaxis:  SCDs   Objective: Vitals:   03/23/17 1946 03/24/17 0649 03/24/17 1006 03/24/17 1045  BP: (!) 173/76 (!) 141/70 (!) 128/58 (!) 134/58  Pulse: 76 80 69 74  Resp: 16 16 15 17   Temp: 98.9 F (37.2 C) 99.3 F (37.4 C) 100.2 F (37.9 C) 100.3 F (37.9 C)  TempSrc: Oral Oral Oral Oral  SpO2: 98% 99% 98% 97%  Weight:      Height:        Intake/Output Summary (Last 24 hours) at 03/24/17 1402 Last data filed at 03/24/17 1225  Gross per 24 hour  Intake              650 ml  Output               20 ml  Net              630 ml   Filed Weights   03/12/17 1000 03/20/17 0707  Weight: 72.1 kg (158 lb 15.2 oz) 71.7 kg (158 lb)    Exam:   General:  Alert and oriented 3, no acute distress   HEENT: Normocephalic major medical mucous murmurs are moist  Neck: Supple, no JVD  Cardiovascular: regular rate and rhythm, P2-R5, 2/6 systolic ejection murmur   Respiratory: clear auscultation bilaterally   Abdomen: soft, nontender, nondistended, positive bowel sounds   Musculoskeletal: status post left AKA with wound VAC in place, some edema noted   Skin: noted left AKA, with VAC in place  Psychiatry: appropriate,  no evidence of psychoses    Data Reviewed: CBC:  Recent Labs Lab 03/18/17 0457  03/20/17 0422 03/21/17 0340 03/22/17 0332 03/23/17 0320 03/24/17 0411  WBC 6.7  < > 5.4 8.5 10.0 8.2 12.4*  NEUTROABS 4.7  --   --   --   --   --   --   HGB 6.9*  < > 7.7* 5.2* 6.9* 8.1* 7.5*  HCT 22.1*  < > 23.7* 16.1* 21.5* 24.9* 23.2*  MCV 86.7  < > 85.6 84.7 84.3 85.9 85.3  PLT 191  < > 207 209 202 187 236  < > = values in this interval not displayed. Basic Metabolic Panel:  Recent Labs Lab 03/18/17 0457 03/20/17 0422 03/22/17 0332 03/24/17 0411  NA  137 135 134* 135  K 3.8 3.2* 4.7 4.6  CL 106 108 104 104  CO2 22 23 22  21*  GLUCOSE 97 95 105* 146*  BUN 54* 52* 56* 59*  CREATININE 3.66* 3.31* 3.52* 3.55*  CALCIUM 8.5* 8.4* 7.7* 8.1*  MG 1.9  --   --   --    GFR: Estimated Creatinine Clearance: 16 mL/min (A) (by C-G formula based on SCr of 3.55 mg/dL (H)). Liver Function Tests: No results for input(s): AST, ALT, ALKPHOS, BILITOT, PROT, ALBUMIN in the last 168 hours. No results for input(s): LIPASE, AMYLASE in the last 168 hours. No results for input(s): AMMONIA in the last 168 hours. Coagulation Profile: No results for input(s): INR, PROTIME in the last 168 hours. Cardiac Enzymes:  Recent Labs Lab 03/19/17 0453  CKTOTAL 42   BNP (last 3 results) No results for input(s): PROBNP in the last 8760 hours. HbA1C: No results for input(s): HGBA1C in the last 72 hours. CBG:  Recent Labs Lab 03/23/17 1148 03/23/17 1650 03/23/17 2128 03/24/17 0616 03/24/17 1210  GLUCAP 147* 106* 157* 133* 126*   Lipid Profile: No results for input(s): CHOL, HDL, LDLCALC, TRIG, CHOLHDL, LDLDIRECT in the last 72 hours. Thyroid Function Tests: No results for input(s): TSH, T4TOTAL, FREET4, T3FREE, THYROIDAB in the last 72 hours. Anemia Panel: No results for input(s): VITAMINB12, FOLATE, FERRITIN, TIBC, IRON, RETICCTPCT in the last 72 hours. Urine analysis:    Component Value Date/Time   COLORURINE AMBER (A) 02/08/2017 1138   APPEARANCEUR TURBID (A) 02/08/2017 1138   LABSPEC 1.014 02/08/2017 1138   PHURINE 5.0 02/08/2017 1138   GLUCOSEU NEGATIVE 02/08/2017 1138   HGBUR MODERATE (A) 02/08/2017 1138   BILIRUBINUR NEGATIVE 02/08/2017 1138   KETONESUR NEGATIVE 02/08/2017 1138   PROTEINUR 100 (A) 02/08/2017 1138   UROBILINOGEN 0.2 09/02/2014 1504   NITRITE NEGATIVE 02/08/2017 1138   LEUKOCYTESUR LARGE (A) 02/08/2017 1138   Sepsis Labs: @LABRCNTIP (procalcitonin:4,lacticidven:4)  )No results found for this or any previous visit (from the  past 240 hour(s)).    Studies: No results found.  Scheduled Meds: . amLODipine  10 mg Oral QHS  . calcitRIOL  0.25 mcg Oral Q M,W,F  . docusate sodium  100 mg Oral BID  . ferrous sulfate  325 mg Oral BID WC  . furosemide  20 mg Intravenous Once  . furosemide  80 mg Oral BID  . gabapentin  100 mg Oral BID  . hydrALAZINE  100 mg Oral BID  . insulin aspart  0-9 Units Subcutaneous TID WC  . insulin glargine  15 Units Subcutaneous QAC breakfast  . metoprolol succinate  50 mg Oral BID  . multivitamin with minerals  1 tablet Oral Daily  . nicotine  14  mg Transdermal Daily  . pantoprazole  40 mg Oral Daily  . senna-docusate  2 tablet Oral QHS  . sertraline  100 mg Oral Daily  . sodium chloride flush  3 mL Intravenous Q12H  . traZODone  50 mg Oral QHS    Continuous Infusions: . sodium chloride 0 mL (03/11/17 0700)  . sodium chloride    . sodium chloride 10 mL/hr at 03/20/17 1148  . methocarbamol (ROBAXIN)  IV       LOS: 14 days     Annita Brod, MD [facility] Pager [phone]  If 7PM-7AM, please contact night-coverage [email or web address] Password [email or web address] 03/24/2017, 2:02 PM

## 2017-03-25 ENCOUNTER — Telehealth (INDEPENDENT_AMBULATORY_CARE_PROVIDER_SITE_OTHER): Payer: Self-pay | Admitting: *Deleted

## 2017-03-25 LAB — BPAM RBC
Blood Product Expiration Date: 201810192359
Blood Product Expiration Date: 201810202359
Blood Product Expiration Date: 201810222359
ISSUE DATE / TIME: 201810071153
ISSUE DATE / TIME: 201810071516
ISSUE DATE / TIME: 201810091003
Unit Type and Rh: 6200
Unit Type and Rh: 6200
Unit Type and Rh: 6200

## 2017-03-25 LAB — TYPE AND SCREEN
ABO/RH(D): A POS
Antibody Screen: NEGATIVE
Unit division: 0
Unit division: 0
Unit division: 0

## 2017-03-25 LAB — BASIC METABOLIC PANEL
Anion gap: 9 (ref 5–15)
BUN: 63 mg/dL — ABNORMAL HIGH (ref 6–20)
CO2: 22 mmol/L (ref 22–32)
Calcium: 8 mg/dL — ABNORMAL LOW (ref 8.9–10.3)
Chloride: 105 mmol/L (ref 101–111)
Creatinine, Ser: 3.47 mg/dL — ABNORMAL HIGH (ref 0.44–1.00)
GFR calc Af Amer: 15 mL/min — ABNORMAL LOW (ref >60)
GFR calc non Af Amer: 13 mL/min — ABNORMAL LOW (ref >60)
Glucose, Bld: 85 mg/dL (ref 65–99)
Potassium: 3.9 mmol/L (ref 3.5–5.1)
Sodium: 136 mmol/L (ref 135–145)

## 2017-03-25 LAB — GLUCOSE, CAPILLARY
Glucose-Capillary: 81 mg/dL (ref 65–99)
Glucose-Capillary: 115 mg/dL — ABNORMAL HIGH (ref 65–99)
Glucose-Capillary: 145 mg/dL — ABNORMAL HIGH (ref 65–99)
Glucose-Capillary: 135 mg/dL — ABNORMAL HIGH (ref 65–99)

## 2017-03-25 LAB — CBC
HCT: 24.8 % — ABNORMAL LOW (ref 36.0–46.0)
Hemoglobin: 7.9 g/dL — ABNORMAL LOW (ref 12.0–15.0)
MCH: 27.5 pg (ref 26.0–34.0)
MCHC: 31.9 g/dL (ref 30.0–36.0)
MCV: 86.4 fL (ref 78.0–100.0)
Platelets: 218 10*3/uL (ref 150–400)
RBC: 2.87 MIL/uL — ABNORMAL LOW (ref 3.87–5.11)
RDW: 16.2 % — ABNORMAL HIGH (ref 11.5–15.5)
WBC: 9.7 10*3/uL (ref 4.0–10.5)

## 2017-03-25 LAB — OCCULT BLOOD X 1 CARD TO LAB, STOOL: Fecal Occult Bld: NEGATIVE

## 2017-03-25 NOTE — Progress Notes (Signed)
I met with pt at bedside and she prefers an inpt rehab bed rather than SNF. Tolerance with therapy has improved after transfusion. No bed available today. Hopeful for bed availability tomorrow. RN CM is aware. I will follow up tomorrow. [phone]

## 2017-03-25 NOTE — Progress Notes (Signed)
Attempted to take VAC dsg off, peeling up the "tegaderm" dsg was too painful for pt, pt needed a break from continuing to remove entire dsg. Pt was medication with oxycodone prior to. Skin intact in the "teagderm" dsg area, just sensitive peeling off--no hairs visible on leg so dsg not pulling hairs.  Giving pt a break at this time before rest of dsg can be removed and apply mepilex dsg as ordered.

## 2017-03-25 NOTE — PMR Pre-admission (Signed)
PMR Admission Coordinator Pre-Admission Assessment  Patient: Brandy Houston is an 61 y.o., female MRN: [id number] DOB: 1955/11/29 Height: 5\' 7"  (170.2 cm) Weight: 71.7 kg (158 lb)              Insurance Information HMO:     PPO:      PCP:      IPA:      80/20: yes     OTHER: no HMO PRIMARY: Medicare a and b      Policy#: [id number]      Subscriber: pt Benefits:  Phone #: online     Name: 03/24/2017 Eff. Date: 01/14/2017     Deduct: $1340      Out of Pocket Max: none      Life Max: none CIR: 100%      SNF: 20 full days Outpatient: 80%     Co-Pay: 20% Home Health: 100 %      Co-Pay: none DME: 80%    Co-Pay: 20% Providers: pt choice  SECONDARY: Medicaid [place] Access      Policy#: [id number]      Subscriber: pt  Medicaid Application Date:       Case Manager:  Disability Application Date:       Case Worker:   Emergency Contact Information Contact Information    Name Relation Home Work [place] Son   [phone]   Stepanian,[name] Daughter   [phone]     Current Medical History  Patient Admitting Diagnosis: left AKA  History of Present Illness: HPI:  Brandy Houston a 61 y.o.femalewith history of diastolic CHF, I3GP, CKD- V, septic arthritis left knee s/p I and D with antibiotic therapy but continued to poor wound healing who was admitted on 03/10/17 from [facility] with dehiscence of wound. She failed attempts at limb salvage and required L-AKA on 10/5 by Dr. Sharol Given. Post op has had issues with dizziness as well as acute on chronic anemia requiring transfusions, currently receiving a transfusion. Therapy ongoing with patient making good progress. She has had significant decline in mobility and ability to carryout ADLs. CIR recommended by MD for follow up therapy.    Past Medical History  Past Medical History:  Diagnosis Date  . Anemia   . Anxiety   . Arthritis    "in my joints" (03/10/2017)  . Chronic diastolic CHF (congestive heart failure) ([place])   . CKD  (chronic kidney disease), stage IV ([place])    stage IV. previous HD, none currently 10/30/15 (confirmed 02/05/2017 & 03/10/2017)  . Depression    Chronic  . Diabetic peripheral neuropathy ([place])   . Dysrhythmia    tachycardia, normal ECHO 08-09-14  . GERD (gastroesophageal reflux disease)   . Heart murmur     dx'd 02/05/2017  . Hepatitis C    "tx'd in 2016; I'm negative now" (02/05/2017)  . History of blood transfusion 2017   "w/knee replacement"  . Hypertension   . Osteoarthritis of left knee   . Peripheral neuropathy   . Protein calorie malnutrition ([place])   . Septic arthritis ([place])   . Slow transit constipation   . Type II diabetes mellitus ([name])    IDDM  . Uncontrolled hypertension 02/18/2013  . Unsteady gait     Family History  family history includes Cancer in her brother, mother, and sister; Diabetes in her brother; Heart disease in her father; Hypertension in her brother and sister.  Prior Rehab/Hospitalizations:  Has the patient had major surgery during 100 days prior  to admission? Yes  Recent stay at [facility] for antibiotics  Current Medications   Current Facility-Administered Medications:  .  0.9 %  sodium chloride infusion, 250 mL, Intravenous, PRN, Arrien, Jimmy Picket, MD, Last Rate: 0 mL/hr at 03/11/17 0700 .  0.9 %  sodium chloride infusion, , Intravenous, Continuous, Lyndle Herrlich, MD .  0.9 %  sodium chloride infusion, , Intravenous, Continuous, Newt Minion, MD, Last Rate: 10 mL/hr at 03/20/17 1148 .  acetaminophen (TYLENOL) tablet 650 mg, 650 mg, Oral, Q6H PRN, 650 mg at 03/24/17 1056 **OR** acetaminophen (TYLENOL) suppository 650 mg, 650 mg, Rectal, Q6H PRN, Arrien, Jimmy Picket, MD .  amLODipine (NORVASC) tablet 10 mg, 10 mg, Oral, QHS, Arrien, Jimmy Picket, MD, 10 mg at 03/24/17 2158 .  bisacodyl (DULCOLAX) suppository 10 mg, 10 mg, Rectal, Daily PRN, Newt Minion, MD .  calcitRIOL (ROCALTROL) capsule 0.25 mcg, 0.25 mcg, Oral, Q M,W,F, Arrien, Jimmy Picket, MD, 0.25 mcg at 03/25/17 0956 .  calcium carbonate (TUMS - dosed in mg elemental calcium) chewable tablet 200 mg of elemental calcium, 1 tablet, Oral, TID PRN, Arrien, Jimmy Picket, MD .  diphenhydrAMINE (BENADRYL) capsule 25 mg, 25 mg, Oral, Q6H PRN, Arrien, Jimmy Picket, MD, 25 mg at 03/21/17 1721 .  docusate sodium (COLACE) capsule 100 mg, 100 mg, Oral, BID, Newt Minion, MD, 100 mg at 03/25/17 0956 .  ferrous sulfate tablet 325 mg, 325 mg, Oral, BID WC, Arrien, Jimmy Picket, MD, 325 mg at 03/25/17 0835 .  furosemide (LASIX) tablet 80 mg, 80 mg, Oral, BID, Arrien, Jimmy Picket, MD, 80 mg at 03/25/17 0835 .  gabapentin (NEURONTIN) capsule 100 mg, 100 mg, Oral, BID, Alekh, Kshitiz, MD, 100 mg at 03/25/17 0956 .  hydrALAZINE (APRESOLINE) tablet 100 mg, 100 mg, Oral, BID, Arrien, Jimmy Picket, MD, 100 mg at 03/25/17 0956 .  HYDROmorphone (DILAUDID) injection 1 mg, 1 mg, Intravenous, Q2H PRN, Newt Minion, MD, 1 mg at 03/24/17 2012 .  insulin aspart (novoLOG) injection 0-9 Units, 0-9 Units, Subcutaneous, TID WC, Arrien, Jimmy Picket, MD, 1 Units at 03/24/17 1312 .  insulin glargine (LANTUS) injection 15 Units, 15 Units, Subcutaneous, QAC breakfast, Arrien, Jimmy Picket, MD, 15 Units at 03/25/17 [phone] .  loratadine (CLARITIN) tablet 10 mg, 10 mg, Oral, Daily PRN, Starla Link, Kshitiz, MD .  magnesium citrate solution 1 Bottle, 1 Bottle, Oral, Once PRN, Newt Minion, MD .  methocarbamol (ROBAXIN) tablet 500 mg, 500 mg, Oral, Q6H PRN, 500 mg at 03/25/17 1120 **OR** methocarbamol (ROBAXIN) 500 mg in dextrose 5 % 50 mL IVPB, 500 mg, Intravenous, Q6H PRN, Newt Minion, MD .  metoprolol succinate (TOPROL-XL) 24 hr tablet 50 mg, 50 mg, Oral, BID, Arrien, Jimmy Picket, MD, 50 mg at 03/25/17 0956 .  multivitamin with minerals tablet 1 tablet, 1 tablet, Oral, Daily, Arrien, Jimmy Picket, MD, 1 tablet at 03/25/17 0956 .  nicotine (NICODERM CQ - dosed in mg/24 hours) patch 14 mg,  14 mg, Transdermal, Daily, Arrien, Jimmy Picket, MD, 14 mg at 03/25/17 0956 .  ondansetron (ZOFRAN) tablet 4 mg, 4 mg, Oral, Q6H PRN, 4 mg at 03/11/17 1338 **OR** ondansetron (ZOFRAN) injection 4 mg, 4 mg, Intravenous, Q6H PRN, Arrien, Jimmy Picket, MD, 4 mg at 03/13/17 1002 .  oxyCODONE (Oxy IR/ROXICODONE) immediate release tablet 5-10 mg, 5-10 mg, Oral, Q4H PRN, Samuella Cota, MD, 10 mg at 03/25/17 1315 .  pantoprazole (PROTONIX) EC tablet 40 mg, 40 mg, Oral, Daily, Arrien, Jimmy Picket, MD, 40  mg at 03/25/17 0956 .  polyethylene glycol (MIRALAX / GLYCOLAX) packet 17 g, 17 g, Oral, Daily PRN, Newt Minion, MD .  senna-docusate (Senokot-S) tablet 2 tablet, 2 tablet, Oral, QHS, Arrien, Jimmy Picket, MD, 2 tablet at 03/24/17 2158 .  sertraline (ZOLOFT) tablet 100 mg, 100 mg, Oral, Daily, Arrien, Jimmy Picket, MD, 100 mg at 03/25/17 0956 .  sodium chloride flush (NS) 0.9 % injection 10-40 mL, 10-40 mL, Intracatheter, PRN, Arrien, Jimmy Picket, MD, 30 mL at 03/25/17 0419 .  sodium chloride flush (NS) 0.9 % injection 3 mL, 3 mL, Intravenous, Q12H, Arrien, Jimmy Picket, MD, 3 mL at 03/25/17 1000 .  sodium chloride flush (NS) 0.9 % injection 3 mL, 3 mL, Intravenous, PRN, Arrien, Jimmy Picket, MD, 3 mL at 03/22/17 0918 .  traZODone (DESYREL) tablet 50 mg, 50 mg, Oral, QHS, Samuella Cota, MD, 50 mg at 03/24/17 2158  Patients Current Diet: Diet Carb Modified Fluid consistency: Thin; Room service appropriate? Yes  Precautions / Restrictions Precautions Precautions: Fall Precaution Comments: wound VAC Restrictions Weight Bearing Restrictions: Yes LLE Weight Bearing: Non weight bearing  VAC discontinued 03/25/17  Has the patient had 2 or more falls or a fall with injury in the past year?No  Prior Activity Level Community (5-7x/wk): was independent and driving pta. Prior to 02/06/17, but was Mod I on RW and independent with all adls. Used [employer] transportation for  appointments.  Home Assistive Devices / Equipment Home Assistive Devices/Equipment: Environmental consultant (specify type), Dentures (specify type), Eyeglasses, CBG Meter, Grab bars around toilet, Grab bars in shower, Hand-held shower hose, Shower chair without back, Wheelchair Home Equipment: Environmental consultant - 2 wheels, Wheelchair - manual, Bedside commode, Cane - single point, Environmental consultant - 4 wheels, Shower seat, Hand held shower head  Prior Device Use: Indicate devices/aids used by the patient prior to current illness, exacerbation or injury? Manual wheelchair and Walker  Prior Functional Level   Was Mod I RW prior to 02/06/2017.  Prior Function Level of Independence: Needs assistance ADL's / Homemaking Assistance Needed: family does the homemaking; Pt reports family was assisting with ADLs   Self Care: Did the patient need help bathing, dressing, using the toilet or eating?  Needed some help  Indoor Mobility: Did the patient need assistance with walking from room to room (with or without device)? Needed some help  Stairs: Did the patient need assistance with internal or external stairs (with or without device)? Needed some help  Functional Cognition: Did the patient need help planning regular tasks such as shopping or remembering to take medications? Needed some help  Current Functional Level Cognition  Overall Cognitive Status: Within Functional Limits for tasks assessed Orientation Level: Oriented X4 General Comments: pt with varied level of assist reported when attempting to obtain PLOF    Extremity Assessment (includes Sensation/Coordination)  Upper Extremity Assessment: Overall WFL for tasks assessed  Lower Extremity Assessment: LLE deficits/detail LLE Deficits / Details: LLE NWB; able to accept full weight onto R LE during static stance    ADLs       Mobility  Overal bed mobility: Needs Assistance Bed Mobility: Supine to Sit Supine to sit: Supervision General bed mobility comments: supervision  for safety    Transfers  Overall transfer level: Needs assistance Equipment used: Rolling walker (2 wheeled) Transfers: Sit to/from Stand Sit to Stand: Min guard General transfer comment: min guard for safety    Ambulation / Gait / Stairs / Wheelchair Mobility  Ambulation/Gait Ambulation/Gait assistance: Physicist, medical (  Feet): 40 Feet Assistive device: Rolling walker (2 wheeled) Gait Pattern/deviations: Trunk flexed (hop-to on R LE) General Gait Details: mild instability but no overt LOB, mostly limited secondary to increased pain in LLE residual limb, min guard for safety. pt requiring frequent standing rest breaks secondary to bilateral UE fatigue and pain. Gait velocity: Decreased Gait velocity interpretation: Below normal speed for age/gender    Posture / Balance Balance Overall balance assessment: Needs assistance Sitting-balance support: Feet supported, No upper extremity supported Sitting balance-Leahy Scale: Good Standing balance support: Bilateral upper extremity supported Standing balance-Leahy Scale: Poor Standing balance comment: Reliant on BUE support    Special needs/care consideration BiPAP/CPAP  N/a CPM  N/a Continuous Drip IV  N/a Dialysis  N/a Life Vest  N/a Oxygen  N/a Special Bed  N/a Trach Size  N/a Wound Vac discontinued 03/25/2017 by Dr. Sharol Given Skin surgical incision ; LUE arm graft nonfunctioning                          Bowel mgmt: 03/24/17 continent Bladder mgmt: continent Diabetic mgmt yes pta   Previous Home Environment Living Arrangements: Children, Non-relatives/Friends, Other relatives  Lives With: Son Available Help at Discharge:  (son works days) Type of Home: House Home Layout: Two level, Able to live on main level with bedroom/bathroom Home Access: Stairs to enter Entrance Stairs-Rails: Left Entrance Stairs-Number of Steps: 3 Bathroom Shower/Tub: Multimedia programmer: [place]: Yes Type of  Home Care Services: [place] aide, Home OT, Home PT, [facility] (if known): [facility]; [facility]? Additional Comments: Pt reports a ramp may be being built for her to get inside with w/c   Patient lives with son and his girlfriend and 26 and 2 year old grandchildren. Son works 1 pm until 41 pm. His girlfriend and the kids come home about 3 or 3:30 pm daily.   Discharge Living Setting Plans for Discharge Living Setting: Patient's home, Lives with (comment) (son) Type of Home at Discharge: House Discharge Home Layout: Two level, Able to live on main level with bedroom/bathroom Discharge Home Access: Stairs to enter Entrance Stairs-Rails: Left Entrance Stairs-Number of Steps: 3 Discharge Bathroom Shower/Tub: Walk-in shower Discharge Bathroom Toilet: Standard Does the patient have any problems obtaining your medications?: No  Social/Family/Support Systems Patient Roles: Parent Contact Information: Marya Amsler, son Anticipated Caregiver: son Anticipated Caregiver's Contact Information: see above Ability/Limitations of Caregiver: son works Building control surveyor Availability: Intermittent Discharge Plan Discussed with Primary Caregiver: Yes Is Caregiver In Agreement with Plan?: Yes Does Caregiver/Family have Issues with Lodging/Transportation while Pt is in Rehab?: No  Goals/Additional Needs Patient/Family Goal for Rehab: Mod I to supervision with PT, Mod I to min assist with OT Expected length of stay: ELOS 10- 14 days Pt/Family Agrees to Admission and willing to participate: Yes Program Orientation Provided & Reviewed with Pt/Caregiver Including Roles  & Responsibilities: Yes  Barriers to Discharge: Decreased caregiver support  Decrease burden of Care through IP rehab admission: n/a  Possible need for SNF placement upon discharge: not anticipated  Patient Condition: This patient's medical and functional status has changed since the consult dated: 03/24/2017 in which the  Rehabilitation Physician determined and documented that the patient's condition is appropriate for intensive rehabilitative care in an inpatient rehabilitation facility. See "History of Present Illness" (above) for medical update. Functional changes are: min assist. Patient's medical and functional status update has been discussed with the Rehabilitation physician and patient remains appropriate for  inpatient rehabilitation. Will admit to inpatient rehab today.  Preadmission Screen Completed By:  Cleatrice Burke, 03/25/2017 4:04 PM ______________________________________________________________________   Discussed status with Dr. Letta Pate on 03/26/2017 at  1228 and received telephone approval for admission today.  Admission Coordinator:  Cleatrice Burke, time 1228 Date 03/26/2017

## 2017-03-25 NOTE — Progress Notes (Signed)
[name], female   DOB: 08-12-1955, 61 y.o.   MRN: [id number] Patient is status post left above-knee amputation. The wound VAC has no drainage. She is having increased swelling left thigh. We'll have the wound VAC discontinued start dry dressing changes with Aquacel or Mepilex dressing. I will order a stump shrinker from [facility].

## 2017-03-25 NOTE — Progress Notes (Signed)
Physical Therapy Treatment Patient Details Name: Brandy Houston MRN: [id number] DOB: 05-24-1956 Today's Date: 03/25/2017    History of Present Illness Pt is a 61 y.o. female admitted on 03/10/17 with PMH significant for L knee dehiscence. Pt underwent resection, arthroplasty and placement of antibiotic spacer in L knee 32 days ago; she went for a follow-up appt and was found to have exposed subcutaneous tissue with no drainage but with persistent large joint effusion. She was hospitalized for medical optimization in preparation for possible sx. 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 Other pertinent PMH includes HTN, DM, septic arthritis, hep-C, GERD, CKD, CHF, depression, dysrhytmia, anxiety.    PT Comments    Pt making good progress and continues to be very motivated to work with therapists. Pt remains limited by LE and BUE fatigue, requiring intermittent standing rest breaks. Further education provided regarding LE positioning and therapeutic exercises to maintain strength and ROM in residual limb. Pt would continue to benefit from skilled physical therapy services at this time while admitted and after d/c to address the below listed limitations in order to improve overall safety and independence with functional mobility.   Follow Up Recommendations  CIR;Supervision/Assistance - 24 hour     Equipment Recommendations  None recommended by PT;Other (comment) (defer to next venue)    Recommendations for Other Services       Precautions / Restrictions Precautions Precautions: Fall Precaution Comments: wound VAC Restrictions Weight Bearing Restrictions: Yes LLE Weight Bearing: Non weight bearing    Mobility  Bed Mobility Overal bed mobility: Needs Assistance Bed Mobility: Supine to Sit     Supine to sit: Supervision     General bed mobility comments: supervision for safety  Transfers Overall transfer level: Needs  assistance Equipment used: Rolling walker (2 wheeled) Transfers: Sit to/from Stand Sit to Stand: Min guard         General transfer comment: min guard for safety  Ambulation/Gait Ambulation/Gait assistance: Min guard Ambulation Distance (Feet): 40 Feet Assistive device: Rolling walker (2 wheeled) Gait Pattern/deviations: Trunk flexed (hop-to on R LE) Gait velocity: Decreased Gait velocity interpretation: Below normal speed for age/gender General Gait Details: mild instability but no overt LOB, mostly limited secondary to increased pain in LLE residual limb, min guard for safety. pt requiring frequent standing rest breaks secondary to bilateral UE fatigue and pain.   Stairs            Wheelchair Mobility    Modified Rankin (Stroke Patients Only)       Balance Overall balance assessment: Needs assistance Sitting-balance support: Feet supported;No upper extremity supported Sitting balance-Leahy Scale: Good     Standing balance support: Bilateral upper extremity supported Standing balance-Leahy Scale: Poor Standing balance comment: Reliant on BUE support                            Cognition Arousal/Alertness: Awake/alert Behavior During Therapy: WFL for tasks assessed/performed Overall Cognitive Status: Within Functional Limits for tasks assessed                                        Exercises General Exercises - Lower Extremity Gluteal Sets: AROM;Strengthening;Both;10 reps;Seated Amputee Exercises Hip ABduction/ADduction: AROM;Left;10 reps;Seated Straight Leg Raises: AROM;Strengthening;Left;10 reps;Seated    General Comments        Pertinent Vitals/Pain  Pain Assessment: 0-10 Pain Score: 10-Worst pain ever Pain Location: L LE pain, phantom limb pain Pain Descriptors / Indicators: Aching;Grimacing;Guarding Pain Intervention(s): Monitored during session;Repositioned;Patient requesting pain meds-RN notified    Home Living                       Prior Function            PT Goals (current goals can now be found in the care plan section) Acute Rehab PT Goals PT Goal Formulation: With patient Time For Goal Achievement: 04/04/17 Potential to Achieve Goals: Good Progress towards PT goals: Progressing toward goals    Frequency    Min 3X/week      PT Plan Current plan remains appropriate    Co-evaluation              AM-PAC PT "6 Clicks" Daily Activity  Outcome Measure  Difficulty turning over in bed (including adjusting bedclothes, sheets and blankets)?: None Difficulty moving from lying on back to sitting on the side of the bed? : A Little Difficulty sitting down on and standing up from a chair with arms (e.g., wheelchair, bedside commode, etc,.)?: Unable Help needed moving to and from a bed to chair (including a wheelchair)?: None Help needed walking in hospital room?: A Little Help needed climbing 3-5 steps with a railing? : A Little 6 Click Score: 18    End of Session Equipment Utilized During Treatment: Gait belt Activity Tolerance: Patient limited by pain Patient left: in chair;with call bell/phone within reach Nurse Communication: Mobility status PT Visit Diagnosis: Other abnormalities of gait and mobility (R26.89);Pain Pain - Right/Left: Left Pain - part of body: Leg     Time: 6578-4696 PT Time Calculation (min) (ACUTE ONLY): 23 min  Charges:  $Gait Training: 8-22 mins $Therapeutic Activity: 8-22 mins                    G Codes:       [place], [place], [place] 03/25/2017, 12:51 PM

## 2017-03-25 NOTE — Telephone Encounter (Signed)
Received call on triage from [place] at [place] stating the hospitalist would like for Dr. Sharol Given to come and see the above pt, states Sharol Given has not seen since Sunday.   Please call [phone]

## 2017-03-25 NOTE — Progress Notes (Signed)
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 Underwent secondary wound closure and application of wound VAC 9/27. Given multiple complications, orthopedics has recommended AKA which was performed 10/5. Hospitalization complicated by anemia of acute illness and ABLA requiring multiple PRBC transfusions.  Patient's hemoglobin dropped to 6.9 on 10/7 requiring 1 unit transfusion packed red blood cells.  Today, patient doing okay. Some pain in her left leg. Hemoglobin dropped to 7.5.  Spoke with infectious disease who recommended antibiotics no longer needed.   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  Needs SNF.  Wound vac without significant out put. Worsening swelling of stump/  Dr Sharol Given informed.   HTN;  on lasix, Norvasc.   Anemia, acute loss on chronic.  Transfused 2 unit packed red blood cells on 10/7. recieved another transfusion 10-09 Report black stool since she has been taking oron.  Hb at 7.9. Repeat labs in am.  Per report patient has received 8 units of blood transfusion.   CKD; cr baseline 3.6.  Stable, monitor on lasix.   DM; on lantus, SSI.   DVT prophylaxis: SCD Code Status: Full code.  Family Communication: care  discussed with patient.  Disposition Plan: to be determine, SNF   Consultants:   Dr Sharol Given.    Procedures:     Antimicrobials: none   Subjective: She is feeling ok, notice increase swelling of stump./   Objective: Vitals:   03/24/17 1420 03/24/17 2043 03/25/17 0500 03/25/17 1154  BP: (!) 119/59 (!) 135/57 (!) 152/87 (!) 129/43  Pulse: 61 65 84 67  Resp:  18 18 17   Temp: 98.2 F (36.8 C) 99 F (37.2 C) 98.2 F (36.8 C) 98.7 F (37.1 C)  TempSrc: Oral Oral Oral Oral  SpO2: 99% 98% 98% 96%  Weight:      Height:        Intake/Output Summary (Last 24 hours) at 03/25/17 1200 Last data filed at 03/25/17 0930  Gross per 24 hour  Intake              950 ml  Output              800 ml  Net              150 ml   Filed Weights   03/12/17 1000 03/20/17 0707  Weight: 72.1 kg (158 lb 15.2 oz) 71.7 kg (158 lb)    Examination:  General exam: Appears calm and comfortable  Respiratory system: Clear to auscultation. Respiratory effort normal. Cardiovascular system: S1 & S2 heard, RRR. No JVD, murmurs, rubs, gallops or clicks. No pedal edema. Gastrointestinal system: Abdomen is nondistended, soft and nontender. No organomegaly or masses felt. Normal bowel sounds heard. Central nervous system: Alert and  oriented. No focal neurological deficits. Extremities: Right AKA, wound vac  no draining , swelling right AKA Skin: No rashes, lesions or ulcers Psychiatry: Judgement and insight appear normal. Mood & affect appropriate.     Data Reviewed: I have personally reviewed following labs and imaging studies  CBC:  Recent Labs Lab 03/21/17 0340 03/22/17 0332 03/23/17 0320 03/24/17 0411 03/25/17 0405  WBC 8.5 10.0 8.2 12.4* 9.7  HGB 5.2* 6.9* 8.1* 7.5* 7.9*  HCT 16.1* 21.5* 24.9* 23.2* 24.8*  MCV 84.7 84.3 85.9 85.3 86.4  PLT 209 202 187 236 967   Basic Metabolic Panel:  Recent Labs Lab 03/20/17 0422 03/22/17 0332 03/24/17 0411 03/25/17 0405  NA 135 134* 135 136    K 3.2* 4.7 4.6 3.9  CL 108 104 104 105  CO2 23 22 21* 22  GLUCOSE 95 105* 146* 85  BUN 52* 56* 59* 63*  CREATININE 3.31* 3.52* 3.55* 3.47*  CALCIUM 8.4* 7.7* 8.1* 8.0*   GFR: Estimated Creatinine Clearance: 16.3 mL/min (A) (by C-G formula based on SCr of 3.47 mg/dL (H)). Liver Function Tests: No results for input(s): AST, ALT, ALKPHOS, BILITOT, PROT, ALBUMIN in the last 168 hours. No results for input(s): LIPASE, AMYLASE in the last 168 hours. No results for input(s): AMMONIA in the last 168 hours. Coagulation Profile: No results for input(s): INR, PROTIME in the last 168 hours. Cardiac Enzymes:  Recent Labs Lab 03/19/17 0453  CKTOTAL 42   BNP (last 3 results) No results for input(s): PROBNP in the last 8760 hours. HbA1C: No results for input(s): HGBA1C in the last 72 hours. CBG:  Recent Labs Lab 03/24/17 1210 03/24/17 1626 03/24/17 2152 03/25/17 0655 03/25/17 1151  GLUCAP 126* 115* 151* 81 115*   Lipid Profile: No results for input(s): CHOL, HDL, LDLCALC, TRIG, CHOLHDL, LDLDIRECT in the last 72 hours. Thyroid Function Tests: No results for input(s): TSH, T4TOTAL, FREET4, T3FREE, THYROIDAB in the last 72 hours. Anemia Panel: No results for input(s): VITAMINB12, FOLATE, FERRITIN, TIBC, IRON, RETICCTPCT in the last 72 hours. Sepsis Labs: No results for input(s): PROCALCITON, LATICACIDVEN in the last 168 hours.  No results found for this or any previous visit (from the past 240 hour(s)).       Radiology Studies: No results found.      Scheduled Meds: . amLODipine  10 mg Oral QHS  . calcitRIOL  0.25 mcg Oral Q M,W,F  . docusate sodium  100 mg Oral BID  . ferrous sulfate  325 mg Oral BID WC  . furosemide  80 mg Oral BID  . gabapentin  100 mg Oral BID  . hydrALAZINE  100 mg Oral BID  . insulin aspart  0-9 Units Subcutaneous TID WC  . insulin glargine  15 Units Subcutaneous QAC breakfast  . metoprolol succinate  50 mg Oral BID  . multivitamin with  minerals  1 tablet Oral Daily  . nicotine  14 mg Transdermal Daily  . pantoprazole  40 mg Oral Daily  . senna-docusate  2 tablet Oral QHS  . sertraline  100 mg Oral Daily  . sodium chloride flush  3 mL Intravenous Q12H  . traZODone  50 mg Oral QHS   Continuous Infusions: . sodium chloride 0 mL (03/11/17 0700)  . sodium chloride    . sodium chloride 10 mL/hr at 03/20/17 1148  . methocarbamol (ROBAXIN)  IV       LOS: 15 days    Time spent: 35 minutes    Norlan Rann, Cassie Freer, MD  [facility] Pager [phone] If 7PM-7AM, please contact night-coverage [email or web address] Password [email or web address] 03/25/2017, 12:00 PM

## 2017-03-25 NOTE — Progress Notes (Signed)
Orthopedic Tech Progress Note Patient Details:  Brandy Houston [birth year] [id number] Called [facility] for brace. Patient ID: [name], female   DOB: 1955/07/24, 61 y.o.   MRN: [id number]   [name] 03/25/2017, 2:50 PM

## 2017-03-26 ENCOUNTER — Encounter (HOSPITAL_COMMUNITY): Payer: Self-pay

## 2017-03-26 ENCOUNTER — Inpatient Hospital Stay (HOSPITAL_COMMUNITY)
Admission: RE | Admit: 2017-03-26 | Discharge: 2017-04-01 | DRG: 560 | Disposition: A | Discharge status: Home Health | Payer: Medicare Other | Source: Intra-hospital | Attending: Physical Medicine & Rehabilitation | Admitting: Physical Medicine & Rehabilitation

## 2017-03-26 DIAGNOSIS — E669 Obesity, unspecified: Secondary | ICD-10-CM | POA: Diagnosis not present

## 2017-03-26 DIAGNOSIS — N185 Chronic kidney disease, stage 5: Secondary | ICD-10-CM | POA: Diagnosis present

## 2017-03-26 DIAGNOSIS — D631 Anemia in chronic kidney disease: Secondary | ICD-10-CM | POA: Diagnosis not present

## 2017-03-26 DIAGNOSIS — I5032 Chronic diastolic (congestive) heart failure: Secondary | ICD-10-CM | POA: Diagnosis present

## 2017-03-26 DIAGNOSIS — F17210 Nicotine dependence, cigarettes, uncomplicated: Secondary | ICD-10-CM | POA: Diagnosis present

## 2017-03-26 DIAGNOSIS — E1169 Type 2 diabetes mellitus with other specified complication: Secondary | ICD-10-CM | POA: Diagnosis not present

## 2017-03-26 DIAGNOSIS — M4856XA Collapsed vertebra, not elsewhere classified, lumbar region, initial encounter for fracture: Secondary | ICD-10-CM | POA: Diagnosis present

## 2017-03-26 DIAGNOSIS — Z96651 Presence of right artificial knee joint: Secondary | ICD-10-CM | POA: Diagnosis present

## 2017-03-26 DIAGNOSIS — Z8249 Family history of ischemic heart disease and other diseases of the circulatory system: Secondary | ICD-10-CM

## 2017-03-26 DIAGNOSIS — Z89612 Acquired absence of left leg above knee: Secondary | ICD-10-CM | POA: Diagnosis not present

## 2017-03-26 DIAGNOSIS — Z794 Long term (current) use of insulin: Secondary | ICD-10-CM

## 2017-03-26 DIAGNOSIS — I10 Essential (primary) hypertension: Secondary | ICD-10-CM

## 2017-03-26 DIAGNOSIS — F329 Major depressive disorder, single episode, unspecified: Secondary | ICD-10-CM | POA: Diagnosis present

## 2017-03-26 DIAGNOSIS — Z809 Family history of malignant neoplasm, unspecified: Secondary | ICD-10-CM

## 2017-03-26 DIAGNOSIS — Z23 Encounter for immunization: Secondary | ICD-10-CM

## 2017-03-26 DIAGNOSIS — N189 Chronic kidney disease, unspecified: Secondary | ICD-10-CM | POA: Diagnosis present

## 2017-03-26 DIAGNOSIS — Z833 Family history of diabetes mellitus: Secondary | ICD-10-CM

## 2017-03-26 DIAGNOSIS — Z4781 Encounter for orthopedic aftercare following surgical amputation: Secondary | ICD-10-CM | POA: Diagnosis not present

## 2017-03-26 DIAGNOSIS — S78112A Complete traumatic amputation at level between left hip and knee, initial encounter: Secondary | ICD-10-CM

## 2017-03-26 DIAGNOSIS — R52 Pain, unspecified: Secondary | ICD-10-CM | POA: Diagnosis not present

## 2017-03-26 DIAGNOSIS — K219 Gastro-esophageal reflux disease without esophagitis: Secondary | ICD-10-CM | POA: Diagnosis present

## 2017-03-26 DIAGNOSIS — D638 Anemia in other chronic diseases classified elsewhere: Secondary | ICD-10-CM | POA: Diagnosis present

## 2017-03-26 DIAGNOSIS — D62 Acute posthemorrhagic anemia: Secondary | ICD-10-CM | POA: Diagnosis not present

## 2017-03-26 DIAGNOSIS — E1142 Type 2 diabetes mellitus with diabetic polyneuropathy: Secondary | ICD-10-CM | POA: Diagnosis present

## 2017-03-26 DIAGNOSIS — R109 Unspecified abdominal pain: Secondary | ICD-10-CM | POA: Diagnosis present

## 2017-03-26 DIAGNOSIS — E1122 Type 2 diabetes mellitus with diabetic chronic kidney disease: Secondary | ICD-10-CM | POA: Diagnosis present

## 2017-03-26 DIAGNOSIS — I132 Hypertensive heart and chronic kidney disease with heart failure and with stage 5 chronic kidney disease, or end stage renal disease: Secondary | ICD-10-CM | POA: Diagnosis present

## 2017-03-26 DIAGNOSIS — Z452 Encounter for adjustment and management of vascular access device: Secondary | ICD-10-CM | POA: Diagnosis not present

## 2017-03-26 DIAGNOSIS — G8918 Other acute postprocedural pain: Secondary | ICD-10-CM | POA: Diagnosis not present

## 2017-03-26 DIAGNOSIS — F32A Depression, unspecified: Secondary | ICD-10-CM

## 2017-03-26 LAB — CBC
HCT: 22.8 % — ABNORMAL LOW (ref 36.0–46.0)
Hemoglobin: 7.2 g/dL — ABNORMAL LOW (ref 12.0–15.0)
MCH: 27.5 pg (ref 26.0–34.0)
MCHC: 31.6 g/dL (ref 30.0–36.0)
MCV: 87 fL (ref 78.0–100.0)
Platelets: 244 10*3/uL (ref 150–400)
RBC: 2.62 MIL/uL — ABNORMAL LOW (ref 3.87–5.11)
RDW: 16.1 % — ABNORMAL HIGH (ref 11.5–15.5)
WBC: 9.6 10*3/uL (ref 4.0–10.5)

## 2017-03-26 LAB — CK: Total CK: 75 U/L (ref 38–234)

## 2017-03-26 LAB — PREPARE RBC (CROSSMATCH)

## 2017-03-26 LAB — GLUCOSE, CAPILLARY
Glucose-Capillary: 86 mg/dL (ref 65–99)
Glucose-Capillary: 118 mg/dL — ABNORMAL HIGH (ref 65–99)
Glucose-Capillary: 110 mg/dL — ABNORMAL HIGH (ref 65–99)
Glucose-Capillary: 174 mg/dL — ABNORMAL HIGH (ref 65–99)

## 2017-03-26 MED ORDER — FLEET ENEMA 7-19 GM/118ML RE ENEM: 1.0000 | ENEMA | Freq: Once | RECTAL | Status: DC | PRN

## 2017-03-26 MED ORDER — SODIUM CHLORIDE 0.9 % IV SOLN
Freq: Once | INTRAVENOUS | Status: AC
  Administered 2017-03-26: 16:00:00 via INTRAVENOUS

## 2017-03-26 MED ORDER — OXYCODONE HCL 5 MG PO TABS
5.0000 mg | ORAL_TABLET | ORAL | Status: DC | PRN
  Administered 2017-03-26 – 2017-03-28 (×6): 10 mg via ORAL
  Administered 2017-03-29 (×2): 5 mg via ORAL
  Administered 2017-03-29: 10 mg via ORAL
  Administered 2017-03-30 (×3): 5 mg via ORAL
  Administered 2017-03-31 – 2017-04-01 (×4): 10 mg via ORAL
  Administered 2017-04-01: 5 mg via ORAL
  Filled 2017-03-26 (×2): qty 2
  Filled 2017-03-26 (×2): qty 1
  Filled 2017-03-26 (×6): qty 2
  Filled 2017-03-26: qty 1
  Filled 2017-03-26 (×3): qty 2
  Filled 2017-03-26: qty 1
  Filled 2017-03-26: qty 2
  Filled 2017-03-26: qty 1

## 2017-03-26 MED ORDER — POLYETHYLENE GLYCOL 3350 17 G PO PACK: 17.0000 g | PACK | Freq: Every day | ORAL | Status: DC | PRN

## 2017-03-26 MED ORDER — DOCUSATE SODIUM 100 MG PO CAPS: 100.0000 mg | ORAL_CAPSULE | Freq: Two times a day (BID) | ORAL | 0 refills | Status: DC

## 2017-03-26 MED ORDER — SENNOSIDES-DOCUSATE SODIUM 8.6-50 MG PO TABS
2.0000 | ORAL_TABLET | Freq: Every day | ORAL | Status: DC
  Filled 2017-03-26: qty 2

## 2017-03-26 MED ORDER — SERTRALINE HCL 100 MG PO TABS
100.0000 mg | ORAL_TABLET | Freq: Every day | ORAL | Status: DC
  Administered 2017-03-27 – 2017-04-01 (×6): 100 mg via ORAL
  Filled 2017-03-26 (×6): qty 1

## 2017-03-26 MED ORDER — DIPHENHYDRAMINE HCL 12.5 MG/5ML PO ELIX
12.5000 mg | ORAL_SOLUTION | Freq: Four times a day (QID) | ORAL | Status: DC | PRN
  Administered 2017-03-28 (×2): 25 mg via ORAL
  Filled 2017-03-26 (×2): qty 10

## 2017-03-26 MED ORDER — CALCIUM CARBONATE ANTACID 500 MG PO CHEW: 1.0000 | CHEWABLE_TABLET | Freq: Three times a day (TID) | ORAL | Status: DC | PRN

## 2017-03-26 MED ORDER — ALUM & MAG HYDROXIDE-SIMETH 200-200-20 MG/5ML PO SUSP: 30.0000 mL | ORAL | Status: DC | PRN

## 2017-03-26 MED ORDER — FUROSEMIDE 80 MG PO TABS
80.0000 mg | ORAL_TABLET | Freq: Two times a day (BID) | ORAL | Status: DC
  Administered 2017-03-27 – 2017-04-01 (×11): 80 mg via ORAL
  Filled 2017-03-26 (×11): qty 1

## 2017-03-26 MED ORDER — ADULT MULTIVITAMIN W/MINERALS CH
1.0000 | ORAL_TABLET | Freq: Every day | ORAL | Status: DC
  Administered 2017-03-27 – 2017-04-01 (×6): 1 via ORAL
  Filled 2017-03-26 (×6): qty 1

## 2017-03-26 MED ORDER — TRAZODONE HCL 50 MG PO TABS
25.0000 mg | ORAL_TABLET | Freq: Every evening | ORAL | Status: DC | PRN
  Administered 2017-03-26 – 2017-03-31 (×6): 50 mg via ORAL
  Filled 2017-03-26 (×6): qty 1

## 2017-03-26 MED ORDER — SODIUM CHLORIDE 0.9 % IV SOLN
510.0000 mg | INTRAVENOUS | Status: DC
  Administered 2017-03-26: 510 mg via INTRAVENOUS
  Filled 2017-03-26: qty 17

## 2017-03-26 MED ORDER — LORATADINE 10 MG PO TABS: 10.0000 mg | ORAL_TABLET | Freq: Every day | ORAL | Status: DC | PRN

## 2017-03-26 MED ORDER — AMLODIPINE BESYLATE 10 MG PO TABS
10.0000 mg | ORAL_TABLET | Freq: Every day | ORAL | Status: DC
  Administered 2017-03-26 – 2017-03-31 (×6): 10 mg via ORAL
  Filled 2017-03-26 (×6): qty 1

## 2017-03-26 MED ORDER — SODIUM CHLORIDE 0.9% FLUSH
10.0000 mL | INTRAVENOUS | Status: DC | PRN
  Administered 2017-03-27 – 2017-03-30 (×2): 10 mL
  Filled 2017-03-26 (×2): qty 40

## 2017-03-26 MED ORDER — INSULIN GLARGINE 100 UNIT/ML ~~LOC~~ SOLN
15.0000 [IU] | Freq: Every day | SUBCUTANEOUS | Status: DC
  Administered 2017-03-27 – 2017-04-01 (×6): 15 [IU] via SUBCUTANEOUS
  Filled 2017-03-26 (×6): qty 0.15

## 2017-03-26 MED ORDER — ONDANSETRON HCL 4 MG PO TABS
4.0000 mg | ORAL_TABLET | Freq: Four times a day (QID) | ORAL | Status: DC | PRN
  Administered 2017-03-27: 4 mg via ORAL
  Filled 2017-03-26: qty 1

## 2017-03-26 MED ORDER — METHOCARBAMOL 1000 MG/10ML IJ SOLN
500.0000 mg | Freq: Four times a day (QID) | INTRAVENOUS | Status: DC | PRN
  Filled 2017-03-26: qty 5

## 2017-03-26 MED ORDER — HYDRALAZINE HCL 50 MG PO TABS
100.0000 mg | ORAL_TABLET | Freq: Two times a day (BID) | ORAL | Status: DC
  Administered 2017-03-26 – 2017-03-30 (×8): 100 mg via ORAL
  Filled 2017-03-26 (×8): qty 2

## 2017-03-26 MED ORDER — BISACODYL 10 MG RE SUPP: 10.0000 mg | Freq: Every day | RECTAL | Status: DC | PRN

## 2017-03-26 MED ORDER — METHOCARBAMOL 500 MG PO TABS
500.0000 mg | ORAL_TABLET | Freq: Four times a day (QID) | ORAL | Status: DC | PRN
  Administered 2017-03-26 – 2017-04-01 (×14): 500 mg via ORAL
  Filled 2017-03-26 (×13): qty 1

## 2017-03-26 MED ORDER — ENOXAPARIN SODIUM 30 MG/0.3ML ~~LOC~~ SOLN
30.0000 mg | SUBCUTANEOUS | Status: DC
  Administered 2017-03-26 – 2017-03-31 (×5): 30 mg via SUBCUTANEOUS
  Filled 2017-03-26 (×6): qty 0.3

## 2017-03-26 MED ORDER — GUAIFENESIN-DM 100-10 MG/5ML PO SYRP: 5.0000 mL | ORAL_SOLUTION | Freq: Four times a day (QID) | ORAL | Status: DC | PRN

## 2017-03-26 MED ORDER — METOPROLOL SUCCINATE ER 50 MG PO TB24
50.0000 mg | ORAL_TABLET | Freq: Two times a day (BID) | ORAL | Status: DC
  Administered 2017-03-26 – 2017-04-01 (×12): 50 mg via ORAL
  Filled 2017-03-26 (×14): qty 1

## 2017-03-26 MED ORDER — PANTOPRAZOLE SODIUM 40 MG PO TBEC
40.0000 mg | DELAYED_RELEASE_TABLET | Freq: Every day | ORAL | Status: DC
  Administered 2017-03-27 – 2017-04-01 (×6): 40 mg via ORAL
  Filled 2017-03-26 (×6): qty 1

## 2017-03-26 MED ORDER — GABAPENTIN 100 MG PO CAPS
100.0000 mg | ORAL_CAPSULE | Freq: Two times a day (BID) | ORAL | Status: DC
  Administered 2017-03-26 – 2017-03-27 (×2): 100 mg via ORAL
  Filled 2017-03-26 (×2): qty 1

## 2017-03-26 MED ORDER — INSULIN ASPART 100 UNIT/ML ~~LOC~~ SOLN
0.0000 [IU] | Freq: Three times a day (TID) | SUBCUTANEOUS | Status: DC
  Administered 2017-03-29 – 2017-03-31 (×2): 1 [IU] via SUBCUTANEOUS

## 2017-03-26 MED ORDER — FERROUS SULFATE 325 (65 FE) MG PO TABS
325.0000 mg | ORAL_TABLET | Freq: Two times a day (BID) | ORAL | Status: DC
  Administered 2017-03-27 – 2017-04-01 (×11): 325 mg via ORAL
  Filled 2017-03-26 (×11): qty 1

## 2017-03-26 MED ORDER — CALCITRIOL 0.25 MCG PO CAPS
0.2500 ug | ORAL_CAPSULE | ORAL | Status: DC
  Administered 2017-03-27 – 2017-03-30 (×2): 0.25 ug via ORAL
  Filled 2017-03-26 (×3): qty 1

## 2017-03-26 MED ORDER — HYDROCERIN EX CREA
TOPICAL_CREAM | Freq: Two times a day (BID) | CUTANEOUS | Status: DC
  Administered 2017-03-26 – 2017-03-28 (×4): via TOPICAL
  Administered 2017-03-28: 1 via TOPICAL
  Administered 2017-03-29 – 2017-04-01 (×7): via TOPICAL
  Filled 2017-03-26: qty 113

## 2017-03-26 MED ORDER — ACETAMINOPHEN 325 MG PO TABS: 325.0000 mg | ORAL_TABLET | ORAL | Status: DC | PRN

## 2017-03-26 MED ORDER — DOCUSATE SODIUM 100 MG PO CAPS
100.0000 mg | ORAL_CAPSULE | Freq: Two times a day (BID) | ORAL | Status: DC
  Administered 2017-03-27 – 2017-03-31 (×8): 100 mg via ORAL
  Filled 2017-03-26 (×12): qty 1

## 2017-03-26 MED ORDER — NICOTINE 14 MG/24HR TD PT24
14.0000 mg | MEDICATED_PATCH | Freq: Every day | TRANSDERMAL | Status: DC
  Administered 2017-03-27 – 2017-04-01 (×6): 14 mg via TRANSDERMAL
  Filled 2017-03-26 (×6): qty 1

## 2017-03-26 MED ORDER — SODIUM CHLORIDE 0.9% FLUSH: 10.0000 mL | Freq: Two times a day (BID) | INTRAVENOUS | Status: DC

## 2017-03-26 MED ORDER — ONDANSETRON HCL 4 MG/2ML IJ SOLN: 4.0000 mg | Freq: Four times a day (QID) | INTRAMUSCULAR | Status: DC | PRN

## 2017-03-26 NOTE — Progress Notes (Deleted)
Jamse Arn, MD Physician Signed Physical Medicine and Rehabilitation  Consult Note Date of Service: 03/24/2017 9:59 AM  Related encounter: Admission (Current) from 03/10/2017 in [place] copied text   Physical Medicine and Rehabilitation Consult  Reason for Consult: L-AKA due to septic knee  Referring Physician: Dr. Sharol Given  HPI: Brandy Houston is a 61 y.o. female with history of diastolic CHF, U5KY, CKD- V, septic arthritis left knee s/p I and D with antibiotic therapy but continued to poor wound healing who was admitted on 03/10/17 from [facility] with dehiscence of wound. History taken from chart review. She failed attempts at limb salvage and required L-AKA on 10/5 by Dr. Sharol Given. Post op has had issues with dizziness as well as acute on chronic anemia requiring transfusions, currently receiving a transfusion. Therapy ongoing with patient making good progress. She has had significant decline in mobility and ability to carryout ALDs. CIR recommended by MD for follow up therapy.  Review of Systems  Constitutional: Negative for chills and fever.  HENT: Negative for hearing loss and tinnitus.  Eyes: Negative for blurred vision and double vision.  Respiratory: Negative for cough and shortness of breath.  Cardiovascular: Negative for chest pain and palpitations.  Gastrointestinal: Negative for abdominal pain, heartburn and nausea.  Genitourinary: Negative for dysuria and urgency.  Musculoskeletal: Positive for joint pain. Negative for myalgias.  Skin: Negative for rash.  Neurological: Positive for sensory change (sharp/stinging pain Left amp site), focal weakness and weakness. Negative for dizziness and headaches.  Psychiatric/Behavioral: Negative for memory loss. The patient does not have insomnia.  All other systems reviewed and are negative.        Past Medical History:  Diagnosis Date  . Anemia   .  Anxiety   . Arthritis    "in my joints" (03/10/2017)  . Chronic diastolic CHF (congestive heart failure) ([place])   . CKD (chronic kidney disease), stage IV ([place])    stage IV. previous HD, none currently 10/30/15 (confirmed 02/05/2017 & 03/10/2017)  . Depression    Chronic  . Diabetic peripheral neuropathy ([place])   . Dysrhythmia    tachycardia, normal ECHO 08-09-14  . GERD (gastroesophageal reflux disease)   . Heart murmur    dx'd 02/05/2017  . Hepatitis C    "tx'd in 2016; I'm negative now" (02/05/2017)  . History of blood transfusion 2017   "w/knee replacement"  . Hypertension   . Osteoarthritis of left knee   . Peripheral neuropathy   . Protein calorie malnutrition ([place])   . Septic arthritis ([place])   . Slow transit constipation   . Type II diabetes mellitus ([name])    IDDM  . Uncontrolled hypertension 02/18/2013  . Unsteady gait          Past Surgical History:  Procedure Laterality Date  . AMPUTATION Left 03/20/2017   Procedure: LEFT ABOVE KNEE AMPUTATION; Surgeon: Newt Minion, MD; Location: [place]; Service: Orthopedics; Laterality: Left;  . APPENDECTOMY    . AV FISTULA PLACEMENT Left 09/13/2014   Procedure: Brachial Artery to Brachial Vein Gortex Four - Seven Stretch GRAFT INSERTION Left Forearm; Surgeon: Mal Misty, MD; Location: [place]; Service: Vascular; Laterality: Left;  . CHOLECYSTECTOMY OPEN    . COLON SURGERY    . EXCISIONAL TOTAL KNEE ARTHROPLASTY WITH ANTIBIOTIC SPACERS Left 02/06/2017   Procedure: Incisional total iknee with antibiotic spacer ;  Surgeon: Leandrew Koyanagi, MD; Location: [place]; Service: Orthopedics; Laterality: Left;  . IR FLUORO GUIDE CV LINE RIGHT  02/10/2017  . IR US GUIDE VASC ACCESS RIGHT  02/10/2017  . IRRIGATION AND DEBRIDEMENT KNEE Left 03/12/2017   Procedure: IRRIGATION AND DEBRIDEMENT LEFT KNEE WITH WOUND VAC APPLICATION; Surgeon: Leandrew Koyanagi, MD; Location: [place]; Service: Orthopedics; Laterality: Left;  . JOINT REPLACEMENT    . KNEE ARTHROSCOPY  Right 08/10/2014   Procedure: ARTHROSCOPY I & D KNEE; Surgeon: Marianna Payment, MD; Location: [facility]; Service: Orthopedics; Laterality: Right;  . KNEE ARTHROSCOPY Left 08/11/2014   Procedure: ARTHROSCOPIC WASHOUT LEFT KNEE; Surgeon: Marianna Payment, MD; Location: [place]; Service: Orthopedics; Laterality: Left;  . KNEE ARTHROSCOPY Left 08/19/2014   Procedure: ARTHROSCOPIC WASHOUT LEFT KNEE; Surgeon: Leandrew Koyanagi, MD; Location: [facility]; Service: Orthopedics; Laterality: Left;  . KNEE ARTHROSCOPY WITH LATERAL MENISECTOMY Left 04/04/2015   Procedure: AND PARTIAL LATERAL MENISECTOMY; Surgeon: Leandrew Koyanagi, MD; Location: [place]; Service: Orthopedics; Laterality: Left;  . KNEE ARTHROSCOPY WITH MEDIAL MENISECTOMY Left 04/04/2015   Procedure: LEFT KNEE ARTHROSCOPY WITH PARTIAL MEDIAL MENISCECTOMY AND SYNOVECTOMY; Surgeon: Leandrew Koyanagi, MD; Location: [place]; Service: Orthopedics; Laterality: Left;  . SHOULDER ARTHROSCOPY Bilateral 08/10/2014   Procedure: I & D BILATERAL SHOULDERS ; Surgeon: Marianna Payment, MD; Location: [facility]; Service: Orthopedics; Laterality: Bilateral;  . SMALL INTESTINE SURGERY     Due to Small Bowel Obstruction; "fixed it when they did my gallbladder OR"  . TEE WITHOUT CARDIOVERSION N/A 08/14/2014   Procedure: TRANSESOPHAGEAL ECHOCARDIOGRAM (TEE); Surgeon: Thayer Headings, MD; Location: [place]; Service: Cardiovascular; Laterality: N/A;  . TENOSYNOVECTOMY Right 08/11/2014   Procedure: RIGHT WRIST IRRIGATION AND DEBRIDEMENT, TENOSYNOVECTOMY; Surgeon: Marianna Payment, MD; Location: [facility]; Service: Orthopedics; Laterality: Right;  . TOTAL KNEE ARTHROPLASTY Left 11/07/2015   Procedure: LEFT TOTAL KNEE ARTHROPLASTY WITH REVISION OF IMPLANTS; Surgeon: Leandrew Koyanagi, MD; Location: [place]; Service: Orthopedics; Laterality: Left;  . TUBAL LIGATION             Family History   Problem Relation Age of Onset   . Cancer Mother    . Heart disease  Father    . Cancer Sister    . Hypertension Sister    . Cancer Brother    . Diabetes Brother    . Hypertension Brother     Social History: Lives with son(he works days). Disabled CNA--independent and was managing home prior to knee surgeries past month. She reports that she has been smoking Cigarette--down to1/2 PPD in the past year. She has a 17.50 pack-year smoking history. She has never used smokeless tobacco. She reports that she drinks alcohol. She reports that she does not use drugs.          Allergies   Allergen Reactions   . Compazine [Prochlorperazine] Shortness Of Breath and Swelling     TONGUE SWELLS   . Shellfish-Derived Products Anaphylaxis   . Iodinated Diagnostic Agents Hives and Rash   . Omnipaque [Iohexol] Hives             Medications Prior to Admission   Medication Sig Dispense Refill   . acetaminophen (TYLENOL) 500 MG tablet Take 1,000 mg by mouth every 6 (six) hours as needed for mild pain.     [name] amLODipine (NORVASC) 10 MG tablet Take 10 mg by mouth at bedtime.      [name] aspirin EC 81 MG tablet Take 81 mg  by mouth daily.     . calcitRIOL (ROCALTROL) 0.25 MCG capsule Take 0.25 mcg by mouth every Monday, Wednesday, and Friday.     . calcium carbonate (TUMS EX) 750 MG chewable tablet Chew 1 tablet by mouth 3 (three) times daily as needed for heartburn.      . ciprofloxacin (CIPRO) 250 MG tablet Take 1 tablet (250 mg total) by mouth daily. 35 tablet 0   . daptomycin (CUBICIN) IVPB Inject 420 mg into the vein every other day. Indication: Left prosthetic knee infection  Last Day of Therapy: 03/20/17  Labs - Once weekly: CBC/D, BMP, and CPK  Labs - Every other week: ESR and CRP 17 Units 0   . Diclofenac Sodium 2 % SOLN Place 2 g onto the skin 2 (two) times daily as needed. (Patient taking differently: Place 2 g onto the skin 2 (two) times daily as needed (for arthritis). ) 1 Bottle 5   . diphenhydrAMINE (BENADRYL) 25 mg capsule Take 1 capsule (25 mg total) by mouth every 6  (six) hours as needed for itching. 30 capsule 0   . ferrous sulfate 325 (65 FE) MG tablet Take 325 mg by mouth 2 (two) times daily with a meal.     . furosemide (LASIX) 80 MG tablet Take 1 tablet (80 mg total) by mouth 2 (two) times daily. 60 tablet 0   . gabapentin (NEURONTIN) 100 MG capsule Take 1 capsule (100 mg total) by mouth 2 (two) times daily. 60 capsule 2   . hydrALAZINE (APRESOLINE) 100 MG tablet Take 100 mg by mouth 2 (two) times daily.      . insulin glargine (LANTUS) 100 UNIT/ML injection Inject 15 Units into the skin daily before breakfast.      . methocarbamol (ROBAXIN) 500 MG tablet TAKE 1 TABLET BY MOUTH EVERY 6 HOURS AS NEEDED FOR MUSCLE SPASM (Patient taking differently: TAKE 500 MG BY MOUTH EVERY 6 HOURS AS NEEDED FOR MUSCLE SPASM) 30 tablet 2   . metoprolol succinate (TOPROL-XL) 50 MG 24 hr tablet Take 50 mg by mouth 2 (two) times daily.      . Multiple Vitamin (MULTIVITAMIN WITH MINERALS) TABS tablet Take 1 tablet by mouth daily.     . nicotine (NICODERM CQ - DOSED IN MG/24 HOURS) 14 mg/24hr patch Place 1 patch (14 mg total) onto the skin daily. 28 patch 0   . omeprazole (PRILOSEC) 20 MG capsule Take 20 mg by mouth daily before breakfast.      . oxyCODONE (OXY IR/ROXICODONE) 5 MG immediate release tablet Take 5-10 mg by mouth every 6 (six) hours as needed for severe pain.     [name] Potassium Chloride ER 20 MEQ TBCR Take 20 mEq by mouth 2 (two) times daily.      . sennosides-docusate sodium (SENOKOT-S) 8.6-50 MG tablet Take 2 tablets by mouth at bedtime.     . sertraline (ZOLOFT) 100 MG tablet Take 1 tablet (100 mg total) by mouth daily. 30 tablet 6   . traMADol (ULTRAM) 50 MG tablet Take 1-2 tablets (50-100 mg total) by mouth every 6 (six) hours as needed. (Patient taking differently: Take 50-100 mg by mouth every 6 (six) hours as needed for moderate pain or severe pain. ) 60 tablet 0   . Blood Glucose Monitoring Suppl (ACCU-CHEK AVIVA PLUS) W/DEVICE KIT Check sugars TID for E11.65  (Patient not taking: Reported on 03/11/2017) 1 kit 0   . Darbepoetin Alfa (ARANESP) 100 MCG/0.5ML SOSY injection Inject 100 mcg into the  skin every 30 (thirty) days.     [name] enoxaparin (LOVENOX) 30 MG/0.3ML injection Inject 0.3 mLs (30 mg total) into the skin daily. (Patient not taking: Reported on 03/12/2017) 4.2 mL 0   . glucose blood (ACCU-CHEK AVIVA) test strip Check sugars TID for E11.65 (Patient not taking: Reported on 03/11/2017) 100 each 12   . Lancet Devices (ACCU-CHEK SOFTCLIX) lancets Check sugars TID for E11.65 (Patient not taking: Reported on 03/11/2017) 1 each 10    Home:  Home Living  Family/patient expects to be discharged to:: Private residence  Living Arrangements: Children, Non-relatives/Friends, Other relatives  Available Help at Discharge: Family, Available PRN/intermittently  Type of Home: House  Home Access: Stairs to enter  Technical brewer of Steps: 3  Entrance Stairs-Rails: Left  Home Layout: Two level, Able to live on main level with bedroom/bathroom  Bathroom Shower/Tub: Tourist information centre manager: [place]: Environmental consultant - 2 wheels, Wheelchair - manual, Bedside commode, Cane - single point, Environmental consultant - 4 wheels, Shower seat, Hand held shower head  Additional Comments: Pt reports a ramp may be being built for her to get inside with w/c  Functional History:  Prior Function  Level of Independence: Needs assistance  Functional Status:  Mobility:  Bed Mobility  Overal bed mobility: Needs Assistance  Bed Mobility: Supine to Sit  Supine to sit: Supervision  General bed mobility comments: HOB elevated, used bed rail  Transfers  Overall transfer level: Needs assistance  Equipment used: Rolling walker (2 wheeled)  Transfers: Sit to/from Stand  Sit to Stand: Min guard  General transfer comment: v/c's to push up from bed, increased time, mildly unsteady during transition of hands from bed to RW  Ambulation/Gait  Ambulation/Gait assistance: Min assist    Ambulation Distance (Feet): 20 Feet  Assistive device: Rolling walker (2 wheeled)  Gait Pattern/deviations: Step-to pattern, Decreased stride length, Trunk flexed  General Gait Details: cues for sequencing, maintaining TDWB, and proximity of RW; Cues to keep RW still when advancing steps forward. Pt fatigues with increased gait distance. Pt putting more forefoot down and required cues to correct. During last 15 feet of gait training patient reports increased pain.  Gait velocity: decreased  Gait velocity interpretation: Below normal speed for age/gender   ADL:   Cognition:  Cognition  Overall Cognitive Status: Within Functional Limits for tasks assessed  Orientation Level: Oriented X4  Cognition  Arousal/Alertness: Awake/alert  Behavior During Therapy: WFL for tasks assessed/performed  Overall Cognitive Status: Within Functional Limits for tasks assessed  Blood pressure (!) 141/70, pulse 80, temperature 99.3 F (37.4 C), temperature source Oral, resp. rate 16, height '5\' 7"'  (1.702 m), weight 71.7 kg (158 lb), last menstrual period 03/16/2002, SpO2 99 %.  Physical Exam  Vitals reviewed.  Constitutional: She is oriented to person, place, and time. She appears well-developed and well-nourished.  HENT:  Head: Normocephalic and atraumatic.  Mouth/Throat: Oropharynx is clear and moist.  Eyes: Pupils are equal, round, and reactive to light. EOM are normal. Right eye exhibits no discharge. Left eye exhibits no discharge.  Neck: Normal range of motion. Neck supple.  Cardiovascular: Normal rate and regular rhythm.  Respiratory: Effort normal and breath sounds normal. No stridor. She has no wheezes.  GI: Soft. Bowel sounds are normal. She exhibits no distension. There is no tenderness.  Musculoskeletal: She exhibits tenderness. She exhibits no edema.  Neurological: She is alert and oriented to person, place, and time.  Sensory deficits RLE Motor: RUE: 4-/5 proximal to distal  LUE: 4/5 proximal  to distal LLE: 3+/5 proximal to distal (pain inhibition) RLE: HF limited by pain  Skin: Skin is warm and dry. She is not diaphoretic.  +VAC LLE  Psychiatric: Her behavior is normal. Judgment and thought content normal. Her affect is blunt.      Lab Results Last 24 Hours                                                                                                                                                                                                                                                                                                       Revision History                                Routing History

## 2017-03-26 NOTE — Progress Notes (Signed)
Pt being discharged to Inpatient Rehab. Pt alert and oriented x4. VSS. Pt c/o no pain at this time. No signs of respiratory distress. Education complete and care plans resolved. Right subclavian line still in place. No further issues at this time. Pt to follow up with PCP. Leanne Chang, RN

## 2017-03-26 NOTE — Progress Notes (Signed)
Report received from [place], [place]. Pt currently transfusing, almost finished. Will transfer shortly.

## 2017-03-26 NOTE — Progress Notes (Signed)
I met with pt at bedside and offered an inpt rehab bed admission. She is in agreement. Noted Hgb 7.2 today. I contacted dr. Tyrell Antonio and the  Plan is  for transfusion and then admission to inpt rehab later today. RN CM and SW made aware. I will make the arrangements. [phone]

## 2017-03-26 NOTE — Progress Notes (Signed)
Orthopedic Tech Progress Note Patient Details:  Brandy Houston Nov 02, 1955 [id number] Brace completed by [facility]. [name], female   DOB: 03-Aug-1955, 61 y.o.   MRN: [id number]   Brandy Houston 03/26/2017, 2:31 PM

## 2017-03-26 NOTE — Discharge Summary (Signed)
Physician Discharge Summary  Brandy Houston IRJ:[id number] DOB: 12/27/1955 DOA: 03/10/2017  PCP: [name], MD  Admit date: 03/10/2017 Discharge date: 03/26/2017  Admitted From: home  Disposition:  CIR  Recommendations for Outpatient Follow-up:  1. Follow up with PCP in 1-2 weeks 2. Follow CBC daily.  3. Please give a dose of IV iron and Aranesp tomorrow. Patient will get Blood transfusion today.    Discharge Condition: stable.  CODE STATUS: Full code.  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 Underwent secondary wound closure and application of wound VAC 9/27. Given multiple complications, orthopedics has recommended AKA which was performed 10/5. Hospitalization complicated by anemia of acute illness and ABLA requiring multiple PRBC transfusions. Patient's hemoglobin dropped to 6.9 on 10/7 requiring 1 unit transfusion packed red blood cells.  Today, patient doing okay. Some pain in her left leg. Hemoglobin dropped to 7.5. Spoke with infectious disease who recommended antibiotics no longer needed.   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  Wound vac removed. Stump is softer, still with significant edema  Dr Sharol Given informed. Follow up with Dr Sharol Given post op. Dr Sharol Given ordered stump shrinker.    HTN;  on lasix, Norvasc.   Anemia, acute loss on chronic.  Transfused 2 unit packed red blood cells on 10/7.recieved another transfusion 10-09 Report black stool since she has been taking oron.  Hb at 7.9. Repeat labs in am. Will transfuse one unit PRBC today  patient has received 8 units of blood transfusion this admission.  Please give IV iron and aranesp tomorrow.  Occult blood negative.    CKD; cr baseline 3.6.  Stable, monitor on lasix.   DM; on lantus, SSI.  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 This should remain in place for 1 week and then start dry dressings changes with a stump shrinker.     Allergies as of 03/26/2017      Reactions   Compazine [prochlorperazine] Shortness Of Breath, Swelling   TONGUE SWELLS   Shellfish-derived Products Anaphylaxis   Iodinated Diagnostic Agents Hives, Rash   Omnipaque [iohexol] Hives      Medication List    STOP taking these medications   aspirin EC 81 MG tablet   ciprofloxacin  250 MG tablet Commonly known as:  CIPRO   daptomycin IVPB Commonly known as:  CUBICIN   Diclofenac Sodium 2 % Soln   enoxaparin 30 MG/0.3ML injection Commonly known as:  LOVENOX   Potassium Chloride ER 20 MEQ Tbcr     TAKE these medications   ACCU-CHEK AVIVA PLUS w/Device Kit Check sugars TID for E11.65   accu-chek softclix lancets Check sugars TID for E11.65   acetaminophen 500 MG  tablet Commonly known as:  TYLENOL Take 1,000 mg by mouth every 6 (six) hours as needed for mild pain.   amLODipine 10 MG tablet Commonly known as:  NORVASC Take 10 mg by mouth at bedtime.   calcitRIOL 0.25 MCG capsule Commonly known as:  ROCALTROL Take 0.25 mcg by mouth every Monday, Wednesday, and Friday.   calcium carbonate 750 MG chewable tablet Commonly known as:  TUMS EX Chew 1 tablet by mouth 3 (three) times daily as needed for heartburn.   Darbepoetin Alfa 100 MCG/0.5ML Sosy injection Commonly known as:  ARANESP Inject 100 mcg into the skin every 30 (thirty) days.   diphenhydrAMINE 25 mg capsule Commonly known as:  BENADRYL Take 1 capsule (25 mg total) by mouth every 6 (six) hours as needed for itching.   docusate sodium 100 MG capsule Commonly known as:  COLACE Take 1 capsule (100 mg total) by mouth 2 (two) times daily.   ferrous sulfate 325 (65 FE) MG tablet Take 325 mg by mouth 2 (two) times daily with a meal.   furosemide 80 MG tablet Commonly known as:  LASIX Take 1 tablet (80 mg total) by mouth 2 (two) times daily.   gabapentin 100 MG capsule Commonly known as:  NEURONTIN Take 1 capsule (100 mg total) by mouth 2 (two) times daily.   glucose blood test strip Commonly known as:  ACCU-CHEK AVIVA Check sugars TID for E11.65   hydrALAZINE 100 MG tablet Commonly known as:  APRESOLINE Take 100 mg by mouth 2 (two) times daily.   insulin glargine 100 UNIT/ML injection Commonly known as:  LANTUS Inject 15 Units into the skin daily before breakfast.   methocarbamol 500 MG tablet Commonly known as:  ROBAXIN TAKE 1 TABLET BY MOUTH EVERY 6 HOURS AS NEEDED FOR MUSCLE SPASM What changed:  See the new instructions.   metoprolol succinate 50 MG 24 hr tablet Commonly known as:  TOPROL-XL Take 50 mg by mouth 2 (two) times daily.   multivitamin with minerals Tabs tablet Take 1 tablet by mouth daily.   nicotine 14 mg/24hr patch Commonly known as:  NICODERM CQ -  dosed in mg/24 hours Place 1 patch (14 mg total) onto the skin daily.   omeprazole 20 MG capsule Commonly known as:  PRILOSEC Take 20 mg by mouth daily before breakfast.   oxyCODONE 5 MG immediate release tablet Commonly known as:  Oxy IR/ROXICODONE Take 5-10 mg by mouth every 6 (six) hours as needed for severe pain.   sennosides-docusate sodium 8.6-50 MG tablet Commonly known as:  SENOKOT-S Take 2 tablets by mouth at bedtime.   sertraline 100 MG tablet Commonly known as:  ZOLOFT Take 1 tablet (100 mg total) by mouth daily.   traMADol 50 MG tablet Commonly known as:  ULTRAM Take 1-2 tablets (50-100 mg total) by mouth every 6 (six) hours as needed. What changed:  reasons to take this      Follow-up Information    [facility] Follow up.   Specialty:  Home Health Services Why:  A representatiove from [facility] will contact you to arrange start date and time for your therapy. Contact information: [address] [place] [address] [phone]        Newt Minion, MD Follow up in 1 week(s).   Specialty:  Orthopedic Surgery Contact information: [address] [place] [address] [phone]          Allergies  Allergen Reactions  . Compazine [Prochlorperazine] Shortness Of Breath and Swelling    TONGUE SWELLS  . Shellfish-Derived Products Anaphylaxis  . Iodinated Diagnostic Agents Hives and Rash  . Omnipaque [Iohexol] Hives    Consultations:   Procedures/Studies: Xr Knee 3 View Left  Result Date: 03/05/2017 Stable articulating knee abx spacer      Subjective: She is feeling better. Report stump is softer, but still swelling.  She gets aranesp and iron outpatient.   Discharge Exam: Vitals:   03/25/17 2016 03/26/17 0505  BP: (!) 154/73 129/63  Pulse: 73 61  Resp: 17 17  Temp: 99.6 F (37.6 C) 98.4 F (36.9 C)  SpO2: 100% 93%   Vitals:   03/25/17 0500 03/25/17 1154 03/25/17 2016 03/26/17 0505  BP: (!)  152/87 (!) 129/43 (!) 154/73 129/63  Pulse: 84 67 73 61  Resp: _0 Temp: 98.2 F (36.8 C) 98.7 F (37.1 C) 99.6 F (37.6 C) 98.4 F (36.9 C)  TempSrc: Oral Oral Oral Oral  SpO2: 98% 96% 100% 93%  Weight:      Height:        General: Pt is alert, awake, not in acute distress Cardiovascular: RRR, S1/S2 +, no rubs, no gallops Respiratory: CTA bilaterally, no wheezing, no rhonchi Abdominal: Soft, NT, ND, bowel sounds + Extremities: left AKA with edema , no hematoma notice.    The results of significant diagnostics from this hospitalization (including imaging, microbiology, ancillary and laboratory) are listed below for reference.     Microbiology: No results found for this or any previous visit (from the past 240 hour(s)).   Labs: BNP (last 3 results) No results for input(s): BNP in the last 8760 hours. Basic Metabolic Panel:  Recent Labs Lab 03/20/17 0422 03/22/17 0332 03/24/17 0411 03/25/17 0405  NA 135 134* 135 136  K 3.2* 4.7 4.6 3.9  CL 108 104 104 105  CO2 23 22 21* 22  GLUCOSE 95 105* 146* 85  BUN 52* 56* 59* 63*  CREATININE 3.31* 3.52* 3.55* 3.47*  CALCIUM 8.4* 7.7* 8.1* 8.0*   Liver Function Tests: No results for input(s): AST, ALT, ALKPHOS, BILITOT, PROT, ALBUMIN in the last 168 hours. No results for input(s): LIPASE, AMYLASE in the last 168 hours. No results for input(s): AMMONIA in the last 168 hours. CBC:  Recent Labs Lab 03/22/17 0332 03/23/17 0320 03/24/17 0411 03/25/17 0405 03/26/17 0449  WBC 10.0 8.2 12.4* 9.7 9.6  HGB 6.9* 8.1* 7.5* 7.9* 7.2*  HCT 21.5* 24.9* 23.2* 24.8* 22.8*  MCV 84.3 85.9 85.3 86.4 87.0  PLT 202 187 236 218 244   Cardiac Enzymes:  Recent Labs Lab 03/26/17 0449  CKTOTAL 75   BNP: Invalid input(s): POCBNP CBG:  Recent Labs Lab 03/25/17 1151 03/25/17 1636 03/25/17 2131 03/26/17 0620 03/26/17 1135  GLUCAP 115* 145* 135* 86 118*   D-Dimer No results for input(s): DDIMER in the last 72  hours. Hgb A1c No results for input(s): HGBA1C in the last 72 hours. Lipid Profile No results for input(s): CHOL, HDL, LDLCALC, TRIG, CHOLHDL,  LDLDIRECT in the last 72 hours. Thyroid function studies No results for input(s): TSH, T4TOTAL, T3FREE, THYROIDAB in the last 72 hours.  Invalid input(s): FREET3 Anemia work up No results for input(s): VITAMINB12, FOLATE, FERRITIN, TIBC, IRON, RETICCTPCT in the last 72 hours. Urinalysis    Component Value Date/Time   COLORURINE AMBER (A) 02/08/2017 1138   APPEARANCEUR TURBID (A) 02/08/2017 1138   LABSPEC 1.014 02/08/2017 1138   PHURINE 5.0 02/08/2017 1138   GLUCOSEU NEGATIVE 02/08/2017 1138   HGBUR MODERATE (A) 02/08/2017 1138   BILIRUBINUR NEGATIVE 02/08/2017 1138   KETONESUR NEGATIVE 02/08/2017 1138   PROTEINUR 100 (A) 02/08/2017 1138   UROBILINOGEN 0.2 09/02/2014 1504   NITRITE NEGATIVE 02/08/2017 1138   LEUKOCYTESUR LARGE (A) 02/08/2017 1138   Sepsis Labs Invalid input(s): PROCALCITONIN,  WBC,  LACTICIDVEN Microbiology No results found for this or any previous visit (from the past 240 hour(s)).   Time coordinating discharge: Over 30 minutes  SIGNED:   Elmarie Shiley, MD  [facility] 03/26/2017, 12:03 PM Pager [phone]  If 7PM-7AM, please contact night-coverage [email or web address] Password [email or web address]

## 2017-03-26 NOTE — H&P (Signed)
Physical Medicine and Rehabilitation Admission H&P    CC:  L-AKA with significant decline in mobility and ability to carryout ALDs.    HPI:  Brandy Houston is a 61 y.o. female with history of diastolic CHF, H2RF, CKD- V, septic arthritis left knee s/p  I and D with antibiotic therapy but continued to poor wound healing who was admitted on 03/10/17 from [facility] with dehiscence of wound. History taken from chart review. She failed attempts at limb salvage and required L-AKA on 10/5 by Dr. Sharol Given.  Post op has had issues with dizziness as well as acute on chronic anemia requiring multiple transfusions.  Therapy ongoing with patient making good progress. She has had significant decline in mobility and ability to carryout ALDs. CIR recommended by MD for follow up therapy.    Review of Systems  Constitutional: Negative for fever.  HENT: Negative for hearing loss.   Eyes: Negative for double vision.  Respiratory: Negative for cough and hemoptysis.   Cardiovascular: Negative for chest pain.  Gastrointestinal: Negative for nausea and vomiting.  Genitourinary: Negative for dysuria.  Musculoskeletal: Positive for joint pain and myalgias.  Skin: Negative for itching.  Neurological: Negative for sensory change and speech change.  Psychiatric/Behavioral: The patient is not nervous/anxious.       Past Medical History:  Diagnosis Date  . Anemia   . Anxiety   . Arthritis    "in my joints" (03/10/2017)  . Chronic diastolic CHF (congestive heart failure) ([place])   . CKD (chronic kidney disease), stage IV ([place])    stage IV. previous HD, none currently 10/30/15 (confirmed 02/05/2017 & 03/10/2017)  . Depression    Chronic  . Diabetic peripheral neuropathy ([place])   . Dysrhythmia    tachycardia, normal ECHO 08-09-14  . GERD (gastroesophageal reflux disease)   . Heart murmur     dx'd 02/05/2017  . Hepatitis C    "tx'd in 2016; I'm negative now" (02/05/2017)  . History of blood transfusion 2017   "w/knee replacement"  . Hypertension   . Osteoarthritis of left knee   . Peripheral neuropathy   . Protein calorie malnutrition ([place])   . Septic arthritis ([place])   . Slow transit constipation   . Type II diabetes mellitus ([name])    IDDM  . Uncontrolled hypertension 02/18/2013  . Unsteady gait     Past Surgical History:  Procedure Laterality Date  . AMPUTATION Left 03/20/2017   Procedure: LEFT ABOVE KNEE AMPUTATION;  Surgeon: Newt Minion, MD;  Location: [place];  Service: Orthopedics;  Laterality: Left;  . APPENDECTOMY    . AV FISTULA PLACEMENT Left 09/13/2014   Procedure: Brachial Artery to Brachial Vein Gortex Four - Seven Stretch GRAFT INSERTION Left Forearm;  Surgeon: Mal Misty, MD;  Location: [place];  Service: Vascular;  Laterality: Left;  . CHOLECYSTECTOMY OPEN    . COLON SURGERY    . EXCISIONAL TOTAL KNEE ARTHROPLASTY WITH ANTIBIOTIC SPACERS Left 02/06/2017   Procedure: Incisional total iknee with antibiotic spacer ;  Surgeon: Leandrew Koyanagi, MD;  Location: [place];  Service: Orthopedics;  Laterality: Left;  . IR FLUORO GUIDE CV LINE RIGHT  02/10/2017  . IR US GUIDE VASC ACCESS RIGHT  02/10/2017  . IRRIGATION AND DEBRIDEMENT KNEE Left 03/12/2017   Procedure: IRRIGATION AND DEBRIDEMENT LEFT KNEE WITH WOUND VAC APPLICATION;  Surgeon: Leandrew Koyanagi, MD;  Location: [place];  Service: Orthopedics;  Laterality: Left;  . JOINT REPLACEMENT    .  KNEE ARTHROSCOPY Right 08/10/2014   Procedure: ARTHROSCOPY I & D KNEE;  Surgeon: Marianna Payment, MD;  Location: [facility];  Service: Orthopedics;  Laterality: Right;  . KNEE ARTHROSCOPY Left 08/11/2014   Procedure: ARTHROSCOPIC WASHOUT LEFT KNEE;  Surgeon: Marianna Payment, MD;  Location: [place];  Service: Orthopedics;  Laterality: Left;  . KNEE ARTHROSCOPY Left 08/19/2014   Procedure: ARTHROSCOPIC WASHOUT LEFT KNEE;  Surgeon: Leandrew Koyanagi, MD;  Location: [place];  Service: Orthopedics;  Laterality: Left;  . KNEE ARTHROSCOPY WITH LATERAL MENISECTOMY Left  04/04/2015   Procedure: AND PARTIAL LATERAL MENISECTOMY;  Surgeon: Leandrew Koyanagi, MD;  Location: [place];  Service: Orthopedics;  Laterality: Left;  . KNEE ARTHROSCOPY WITH MEDIAL MENISECTOMY Left 04/04/2015   Procedure: LEFT KNEE ARTHROSCOPY WITH PARTIAL MEDIAL MENISCECTOMY  AND SYNOVECTOMY;  Surgeon: Leandrew Koyanagi, MD;  Location: [place];  Service: Orthopedics;  Laterality: Left;  . SHOULDER ARTHROSCOPY Bilateral 08/10/2014   Procedure: I & D BILATERAL SHOULDERS ;  Surgeon: Marianna Payment, MD;  Location: [facility];  Service: Orthopedics;  Laterality: Bilateral;  . SMALL INTESTINE SURGERY     Due to Small Bowel Obstruction; "fixed it when they did my gallbladder OR"  . TEE WITHOUT CARDIOVERSION N/A 08/14/2014   Procedure: TRANSESOPHAGEAL ECHOCARDIOGRAM (TEE);  Surgeon: Thayer Headings, MD;  Location: [place];  Service: Cardiovascular;  Laterality: N/A;  . TENOSYNOVECTOMY Right 08/11/2014   Procedure: RIGHT WRIST IRRIGATION AND DEBRIDEMENT, TENOSYNOVECTOMY;  Surgeon: Marianna Payment, MD;  Location: [place];  Service: Orthopedics;  Laterality: Right;  . TOTAL KNEE ARTHROPLASTY Left 11/07/2015   Procedure: LEFT TOTAL KNEE ARTHROPLASTY WITH REVISION OF IMPLANTS;  Surgeon: Leandrew Koyanagi, MD;  Location: [place];  Service: Orthopedics;  Laterality: Left;  . TUBAL LIGATION      Family History  Problem Relation Age of Onset  . Cancer Mother   . Heart disease Father   . Cancer Sister   . Hypertension Sister   . Cancer Brother   . Diabetes Brother   . Hypertension Brother     Social History:  Lives with son(he works days). Disabled CNA--independent and was managing home prior to knee surgeries past month.  She reports that she has been smoking Cigarette--down to1/2 PPD in the past year.   She has a 17.50 pack-year smoking history. She has never used smokeless tobacco. She reports that she drinks alcohol. She reports that she does not use drugs.    Allergies    Allergen Reactions  . Compazine [Prochlorperazine] Shortness Of Breath and Swelling    TONGUE SWELLS  . Shellfish-Derived Products Anaphylaxis  . Iodinated Diagnostic Agents Hives and Rash  . Omnipaque [Iohexol] Hives    Medications Prior to Admission  Medication Sig Dispense Refill  . acetaminophen (TYLENOL) 500 MG tablet Take 1,000 mg by mouth every 6 (six) hours as needed for mild pain.    [name] amLODipine (NORVASC) 10 MG tablet Take 10 mg by mouth at bedtime.     [name] aspirin EC 81 MG tablet Take 81 mg by mouth daily.    . calcitRIOL (ROCALTROL) 0.25 MCG capsule Take 0.25 mcg by mouth every Monday, Wednesday, and Friday.    . calcium carbonate (TUMS EX) 750 MG chewable tablet Chew 1 tablet by mouth 3 (three) times daily as needed for heartburn.     . [EXPIRED] ciprofloxacin (CIPRO) 250 MG tablet Take 1 tablet (250 mg total) by mouth daily. 35 tablet 0  . [  EXPIRED] daptomycin (CUBICIN) IVPB Inject 420 mg into the vein every other day. Indication:  Left prosthetic knee infection Last Day of Therapy:  03/20/17 Labs - Once weekly:  CBC/D, BMP, and CPK Labs - Every other week:  ESR and CRP 17 Units 0  . Diclofenac Sodium 2 % SOLN Place 2 g onto the skin 2 (two) times daily as needed. (Patient taking differently: Place 2 g onto the skin 2 (two) times daily as needed (for arthritis). ) 1 Bottle 5  . diphenhydrAMINE (BENADRYL) 25 mg capsule Take 1 capsule (25 mg total) by mouth every 6 (six) hours as needed for itching. 30 capsule 0  . ferrous sulfate 325 (65 FE) MG tablet Take 325 mg by mouth 2 (two) times daily with a meal.    . furosemide (LASIX) 80 MG tablet Take 1 tablet (80 mg total) by mouth 2 (two) times daily. 60 tablet 0  . gabapentin (NEURONTIN) 100 MG capsule Take 1 capsule (100 mg total) by mouth 2 (two) times daily. 60 capsule 2  . hydrALAZINE (APRESOLINE) 100 MG tablet Take 100 mg by mouth 2 (two) times daily.     . insulin glargine (LANTUS) 100 UNIT/ML injection Inject 15 Units into  the skin daily before breakfast.     . methocarbamol (ROBAXIN) 500 MG tablet TAKE 1 TABLET BY MOUTH EVERY 6 HOURS AS NEEDED FOR MUSCLE SPASM (Patient taking differently: TAKE 500 MG BY MOUTH EVERY 6 HOURS AS NEEDED FOR MUSCLE SPASM) 30 tablet 2  . metoprolol succinate (TOPROL-XL) 50 MG 24 hr tablet Take 50 mg by mouth 2 (two) times daily.     . Multiple Vitamin (MULTIVITAMIN WITH MINERALS) TABS tablet Take 1 tablet by mouth daily.    . nicotine (NICODERM CQ - DOSED IN MG/24 HOURS) 14 mg/24hr patch Place 1 patch (14 mg total) onto the skin daily. 28 patch 0  . omeprazole (PRILOSEC) 20 MG capsule Take 20 mg by mouth daily before breakfast.     . oxyCODONE (OXY IR/ROXICODONE) 5 MG immediate release tablet Take 5-10 mg by mouth every 6 (six) hours as needed for severe pain.    [name] Potassium Chloride ER 20 MEQ TBCR Take 20 mEq by mouth 2 (two) times daily.     . sennosides-docusate sodium (SENOKOT-S) 8.6-50 MG tablet Take 2 tablets by mouth at bedtime.    . sertraline (ZOLOFT) 100 MG tablet Take 1 tablet (100 mg total) by mouth daily. 30 tablet 6  . traMADol (ULTRAM) 50 MG tablet Take 1-2 tablets (50-100 mg total) by mouth every 6 (six) hours as needed. (Patient taking differently: Take 50-100 mg by mouth every 6 (six) hours as needed for moderate pain or severe pain. ) 60 tablet 0  . Blood Glucose Monitoring Suppl (ACCU-CHEK AVIVA PLUS) W/DEVICE KIT Check sugars TID for E11.65 (Patient not taking: Reported on 03/11/2017) 1 kit 0  . Darbepoetin Alfa (ARANESP) 100 MCG/0.5ML SOSY injection Inject 100 mcg into the skin every 30 (thirty) days.    [name] enoxaparin (LOVENOX) 30 MG/0.3ML injection Inject 0.3 mLs (30 mg total) into the skin daily. (Patient not taking: Reported on 03/12/2017) 4.2 mL 0  . glucose blood (ACCU-CHEK AVIVA) test strip Check sugars TID for E11.65 (Patient not taking: Reported on 03/11/2017) 100 each 12  . Lancet Devices (ACCU-CHEK SOFTCLIX) lancets Check sugars TID for E11.65 (Patient not taking:  Reported on 03/11/2017) 1 each 10    Drug Regimen Review  Drug regimen was reviewed and remains appropriate with no  significant issues identified  Home: Home Living Family/patient expects to be discharged to:: Private residence Living Arrangements: Children, Non-relatives/Friends, Other relatives Available Help at Discharge:  (son works days) Type of Home: House Home Access: Stairs to enter Technical brewer of Steps: 3 Entrance Stairs-Rails: Left Home Layout: Two level, Able to live on main level with bedroom/bathroom Bathroom Shower/Tub: Multimedia programmer: [place]: Environmental consultant - 2 wheels, Wheelchair - manual, Bedside commode, Cane - single point, Environmental consultant - 4 wheels, Shower seat, Hand held shower head Additional Comments: Pt reports a ramp may be being built for her to get inside with w/c  Lives With: Son   Functional History: Prior Function Level of Independence: Needs assistance ADL's / Homemaking Assistance Needed: family does the homemaking; Pt reports family was assisting with ADLs   Functional Status:  Mobility: Bed Mobility Overal bed mobility: Needs Assistance Bed Mobility: Supine to Sit Supine to sit: Supervision General bed mobility comments: supervision for safety Transfers Overall transfer level: Needs assistance Equipment used: Rolling walker (2 wheeled) Transfers: Sit to/from Stand Sit to Stand: Min guard General transfer comment: min guard for safety Ambulation/Gait Ambulation/Gait assistance: Min guard Ambulation Distance (Feet): 40 Feet Assistive device: Rolling walker (2 wheeled) Gait Pattern/deviations: Trunk flexed (hop-to on R LE) General Gait Details: mild instability but no overt LOB, mostly limited secondary to increased pain in LLE residual limb, min guard for safety. pt requiring frequent standing rest breaks secondary to bilateral UE fatigue and pain. Gait velocity: Decreased Gait velocity interpretation: Below  normal speed for age/gender    ADL:    Cognition: Cognition Overall Cognitive Status: Within Functional Limits for tasks assessed Orientation Level: Oriented X4 Cognition Arousal/Alertness: Awake/alert Behavior During Therapy: WFL for tasks assessed/performed Overall Cognitive Status: Within Functional Limits for tasks assessed General Comments: pt with varied level of assist reported when attempting to obtain PLOF   Blood pressure 129/63, pulse 61, temperature 98.4 F (36.9 C), temperature source Oral, resp. rate 17, height '5\' 7"'  (1.702 m), weight 71.7 kg (158 lb), last menstrual period 03/16/2002, SpO2 93 %. Physical Exam  Nursing note and vitals reviewed. Constitutional: She is oriented to person, place, and time. She appears well-developed and well-nourished. No distress.  HENT:  Head: Normocephalic and atraumatic.  Mouth/Throat: Oropharynx is clear and moist.  Eyes: Pupils are equal, round, and reactive to light. Conjunctivae and EOM are normal.  Neck: Normal range of motion. Neck supple.  Cardiovascular: Normal rate and regular rhythm.  Exam reveals no friction rub.   Murmur heard. Respiratory: Effort normal and breath sounds normal. No stridor. No respiratory distress. She has no wheezes.  GI: Soft. Bowel sounds are normal. She exhibits no distension. There is no tenderness.  Musculoskeletal:  AVG LUG with + thrill.  Right foot with hard callus on latera plantar surface.   L-AKA site with moderate edema--,mild serosang drainage. Site remains quite tender to touch. Incision intact and well approximated  Neurological: She is alert and oriented to person, place, and time.  Speech clear. Able to follow commands without difficulty. UE 5/5. RLE: 3/5 prox to 4/5 distally. Able to left leg against gravity. No focal sensory abnl. Cognitively appropriate  Skin: Skin is warm and dry. She is not diaphoretic.  Psychiatric: Her speech is normal and behavior is normal. Her mood appears  anxious. Cognition and memory are normal.    Results for orders placed or performed during the hospital encounter of 03/10/17 (from the past 48 hour(s))  Glucose, capillary  Status: Abnormal   Collection Time: 03/24/17 12:10 PM  Result Value Ref Range   Glucose-Capillary 126 (H) 65 - 99 mg/dL  Glucose, capillary     Status: Abnormal   Collection Time: 03/24/17  4:26 PM  Result Value Ref Range   Glucose-Capillary 115 (H) 65 - 99 mg/dL  Glucose, capillary     Status: Abnormal   Collection Time: 03/24/17  9:52 PM  Result Value Ref Range   Glucose-Capillary 151 (H) 65 - 99 mg/dL  Basic metabolic panel     Status: Abnormal   Collection Time: 03/25/17  4:05 AM  Result Value Ref Range   Sodium 136 135 - 145 mmol/L   Potassium 3.9 3.5 - 5.1 mmol/L   Chloride 105 101 - 111 mmol/L   CO2 22 22 - 32 mmol/L   Glucose, Bld 85 65 - 99 mg/dL   BUN 63 (H) 6 - 20 mg/dL   Creatinine, Ser 3.47 (H) 0.44 - 1.00 mg/dL   Calcium 8.0 (L) 8.9 - 10.3 mg/dL   GFR calc non Af Amer 13 (L) >60 mL/min   GFR calc Af Amer 15 (L) >60 mL/min    Comment: (NOTE) The eGFR has been calculated using the CKD EPI equation. This calculation has not been validated in all clinical situations. eGFR's persistently <60 mL/min signify possible Chronic Kidney Disease.    Anion gap 9 5 - 15  CBC     Status: Abnormal   Collection Time: 03/25/17  4:05 AM  Result Value Ref Range   WBC 9.7 4.0 - 10.5 K/uL   RBC 2.87 (L) 3.87 - 5.11 MIL/uL   Hemoglobin 7.9 (L) 12.0 - 15.0 g/dL   HCT 24.8 (L) 36.0 - 46.0 %   MCV 86.4 78.0 - 100.0 fL   MCH 27.5 26.0 - 34.0 pg   MCHC 31.9 30.0 - 36.0 g/dL   RDW 16.2 (H) 11.5 - 15.5 %   Platelets 218 150 - 400 K/uL  Glucose, capillary     Status: None   Collection Time: 03/25/17  6:55 AM  Result Value Ref Range   Glucose-Capillary 81 65 - 99 mg/dL  Glucose, capillary     Status: Abnormal   Collection Time: 03/25/17 11:51 AM  Result Value Ref Range   Glucose-Capillary 115 (H) 65 - 99  mg/dL  Occult blood card to lab, stool     Status: None   Collection Time: 03/25/17  4:30 PM  Result Value Ref Range   Fecal Occult Bld NEGATIVE NEGATIVE  Glucose, capillary     Status: Abnormal   Collection Time: 03/25/17  4:36 PM  Result Value Ref Range   Glucose-Capillary 145 (H) 65 - 99 mg/dL  Glucose, capillary     Status: Abnormal   Collection Time: 03/25/17  9:31 PM  Result Value Ref Range   Glucose-Capillary 135 (H) 65 - 99 mg/dL  CK     Status: None   Collection Time: 03/26/17  4:49 AM  Result Value Ref Range   Total CK 75 38 - 234 U/L  CBC     Status: Abnormal   Collection Time: 03/26/17  4:49 AM  Result Value Ref Range   WBC 9.6 4.0 - 10.5 K/uL   RBC 2.62 (L) 3.87 - 5.11 MIL/uL   Hemoglobin 7.2 (L) 12.0 - 15.0 g/dL   HCT 22.8 (L) 36.0 - 46.0 %   MCV 87.0 78.0 - 100.0 fL   MCH 27.5 26.0 - 34.0 pg   MCHC 31.6  30.0 - 36.0 g/dL   RDW 16.1 (H) 11.5 - 15.5 %   Platelets 244 150 - 400 K/uL  Glucose, capillary     Status: None   Collection Time: 03/26/17  6:20 AM  Result Value Ref Range   Glucose-Capillary 86 65 - 99 mg/dL   No results found.     Medical Problem List and Plan: 1.  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  DVT Prophylaxis/Anticoagulation: Pharmaceutical: Lovenox 3. Pain Management: continue oxycodone and robaxin prn. Monitor  For SE.  4. Mood: Team to provide ego support. LCSW to follow for evaluation and support.    -continue zoloft for depression 5. Neuropsych: This patient is capable of making decisions on her own behalf. 6. Skin/Wound Care: Monitor wound daily for healing  -dry daily dressing, stump protection 7. Fluids/Electrolytes/Nutrition: Monitor I/O.   -I personally reviewed the patient's labs today.    8. HTN: Monitor BP bid--on Lasix, Hydralazine, Metoprolol and Norvasc. 10 T2DM: Monitor BS ac/hs. Continue lantus with breakfast.  -snack HS  -fair control at present 11. CKD stage V:  Continue to monitor renal status every other day--question consult renal.  12. Anemia of chronic disease: Had drop in H/H to 7.2/22.8 requiring another unit of PRBC.  -no new signs of bleeding  -hgb 8.7 today  -?aranesp     Post Admission Physician Evaluation: 1. Functional deficits secondary  to left above knee amputation. 2. Patient is admitted to receive collaborative, interdisciplinary care between the physiatrist, rehab nursing staff, and therapy team. 3. Patient's level of medical complexity and substantial therapy needs in context of that medical necessity cannot be provided at a lesser intensity of care such as a SNF. 4. Patient has experienced substantial functional loss from his/her baseline which was documented above under the "Functional History" and "Functional Status" headings.  Judging by the patient's diagnosis, physical exam, and functional history, the patient has potential for functional progress which will result in measurable gains while on inpatient rehab.  These gains will be of substantial and practical use upon discharge  in facilitating mobility and self-care at the household level. 5. Physiatrist will provide 24 hour management of medical needs as well as oversight of the therapy plan/treatment and provide guidance as appropriate regarding the interaction of the two. 6. The Preadmission Screening has been reviewed and patient status is unchanged unless otherwise stated above. 7. 24 hour rehab nursing will assist with bladder management, bowel management, safety, skin/wound care, disease management, medication administration, pain management and patient education  and help integrate therapy concepts, techniques,education, etc. 8. PT will assess and treat for/with: Lower extremity strength, range of motion, stamina, balance, functional mobility, safety, adaptive techniques and equipment, pre-prosthetic education, pain control, wound mgt.   Goals are: mod I. 9. OT will assess  and treat for/with: ADL's, functional mobility, safety, upper extremity strength, adaptive techniques and equipment, pain mgt, pre-prosthetic education, wound care, family ed.   Goals are: mod I to min assist. Therapy may proceed with showering this patient if wound is covered. 10. SLP will assess and treat for/with: n/a.  Goals are: n/a. 11. Case Management and Social Worker will assess and treat for psychological issues and discharge planning. 12. Team conference will be held weekly to assess progress toward goals and to determine barriers to discharge. 13. Patient will receive at least 3 hours of therapy per day at least 5 days per week. 14. ELOS: 10-14 days  15. Prognosis:  excellent     Meredith Staggers, MD, [place] [facility] 03/27/2017  Bary Leriche, Hershal Coria 03/26/2017

## 2017-03-26 NOTE — Progress Notes (Signed)
Spoke with Nira Conn, RN. Pt was not ready for transfer was waiting on type/cross for blood transfusion. Nira Conn was at lunch unable to give report at this time.

## 2017-03-26 NOTE — Care Management Important Message (Signed)
Important Message  Patient Details  Name: Brandy Houston MRN: [id number] Date of Birth: 1956/03/01   Medicare Important Message Given:  Yes    Nathen May 03/26/2017, 9:44 AM

## 2017-03-26 NOTE — Progress Notes (Signed)
Physical Medicine and Rehabilitation Consult  Reason for Consult: L-AKA due to septic knee  Referring Physician: Dr. Sharol Given   HPI: Brandy Houston is a 61 y.o. female with history of diastolic CHF, B6LA, CKD- V, septic arthritis left knee s/p  I and D with antibiotic therapy but continued to poor wound healing who was admitted on 03/10/17 from [facility] with dehiscence of wound. History taken from chart review. She failed attempts at limb salvage and required L-AKA on 10/5 by Dr. Sharol Given.  Post op has had issues with dizziness as well as acute on chronic anemia requiring transfusions, currently receiving a transfusion.  Therapy ongoing with patient making good progress. She has had significant decline in mobility and ability to carryout ALDs. CIR recommended by MD for follow up therapy.     Review of Systems  Constitutional: Negative for chills and fever.  HENT: Negative for hearing loss and tinnitus.   Eyes: Negative for blurred vision and double vision.  Respiratory: Negative for cough and shortness of breath.   Cardiovascular: Negative for chest pain and palpitations.  Gastrointestinal: Negative for abdominal pain, heartburn and nausea.  Genitourinary: Negative for dysuria and urgency.  Musculoskeletal: Positive for joint pain. Negative for myalgias.  Skin: Negative for rash.  Neurological: Positive for sensory change (sharp/stinging pain Left amp site), focal weakness and weakness. Negative for dizziness and headaches.  Psychiatric/Behavioral: Negative for memory loss. The patient does not have insomnia.   All other systems reviewed and are negative.        Past Medical History:  Diagnosis Date  . Anemia   . Anxiety   . Arthritis    "in my joints" (03/10/2017)  . Chronic diastolic CHF (congestive heart failure) ([place])   . CKD (chronic kidney disease), stage IV ([place])    stage IV. previous HD, none currently 10/30/15 (confirmed 02/05/2017 & 03/10/2017)  .  Depression    Chronic  . Diabetic peripheral neuropathy ([place])   . Dysrhythmia    tachycardia, normal ECHO 08-09-14  . GERD (gastroesophageal reflux disease)   . Heart murmur     dx'd 02/05/2017  . Hepatitis C    "tx'd in 2016; I'm negative now" (02/05/2017)  . History of blood transfusion 2017   "w/knee replacement"  . Hypertension   . Osteoarthritis of left knee   . Peripheral neuropathy   . Protein calorie malnutrition ([place])   . Septic arthritis ([place])   . Slow transit constipation   . Type II diabetes mellitus ([name])    IDDM  . Uncontrolled hypertension 02/18/2013  . Unsteady gait          Past Surgical History:  Procedure Laterality Date  . AMPUTATION Left 03/20/2017   Procedure: LEFT ABOVE KNEE AMPUTATION;  Surgeon: Newt Minion, MD;  Location: [place];  Service: Orthopedics;  Laterality: Left;  . APPENDECTOMY    . AV FISTULA PLACEMENT Left 09/13/2014   Procedure: Brachial Artery to Brachial Vein Gortex Four - Seven Stretch GRAFT INSERTION Left Forearm;  Surgeon: Mal Misty, MD;  Location: [place];  Service: Vascular;  Laterality: Left;  . CHOLECYSTECTOMY OPEN    . COLON SURGERY    . EXCISIONAL TOTAL KNEE ARTHROPLASTY WITH ANTIBIOTIC SPACERS Left 02/06/2017   Procedure: Incisional total iknee with antibiotic spacer ;  Surgeon: Leandrew Koyanagi, MD;  Location: [place];  Service: Orthopedics;  Laterality: Left;  . IR FLUORO GUIDE CV LINE RIGHT  02/10/2017  . IR US GUIDE  VASC ACCESS RIGHT  02/10/2017  . IRRIGATION AND DEBRIDEMENT KNEE Left 03/12/2017   Procedure: IRRIGATION AND DEBRIDEMENT LEFT KNEE WITH WOUND VAC APPLICATION;  Surgeon: Leandrew Koyanagi, MD;  Location: [place];  Service: Orthopedics;  Laterality: Left;  . JOINT REPLACEMENT    . KNEE ARTHROSCOPY Right 08/10/2014   Procedure: ARTHROSCOPY I & D KNEE;  Surgeon: Marianna Payment, MD;  Location: [facility];  Service: Orthopedics;  Laterality: Right;  . KNEE ARTHROSCOPY Left 08/11/2014   Procedure:  ARTHROSCOPIC WASHOUT LEFT KNEE;  Surgeon: Marianna Payment, MD;  Location: [place];  Service: Orthopedics;  Laterality: Left;  . KNEE ARTHROSCOPY Left 08/19/2014   Procedure: ARTHROSCOPIC WASHOUT LEFT KNEE;  Surgeon: Leandrew Koyanagi, MD;  Location: [place];  Service: Orthopedics;  Laterality: Left;  . KNEE ARTHROSCOPY WITH LATERAL MENISECTOMY Left 04/04/2015   Procedure: AND PARTIAL LATERAL MENISECTOMY;  Surgeon: Leandrew Koyanagi, MD;  Location: [place];  Service: Orthopedics;  Laterality: Left;  . KNEE ARTHROSCOPY WITH MEDIAL MENISECTOMY Left 04/04/2015   Procedure: LEFT KNEE ARTHROSCOPY WITH PARTIAL MEDIAL MENISCECTOMY  AND SYNOVECTOMY;  Surgeon: Leandrew Koyanagi, MD;  Location: [place];  Service: Orthopedics;  Laterality: Left;  . SHOULDER ARTHROSCOPY Bilateral 08/10/2014   Procedure: I & D BILATERAL SHOULDERS ;  Surgeon: Marianna Payment, MD;  Location: [facility];  Service: Orthopedics;  Laterality: Bilateral;  . SMALL INTESTINE SURGERY     Due to Small Bowel Obstruction; "fixed it when they did my gallbladder OR"  . TEE WITHOUT CARDIOVERSION N/A 08/14/2014   Procedure: TRANSESOPHAGEAL ECHOCARDIOGRAM (TEE);  Surgeon: Thayer Headings, MD;  Location: [place];  Service: Cardiovascular;  Laterality: N/A;  . TENOSYNOVECTOMY Right 08/11/2014   Procedure: RIGHT WRIST IRRIGATION AND DEBRIDEMENT, TENOSYNOVECTOMY;  Surgeon: Marianna Payment, MD;  Location: [place];  Service: Orthopedics;  Laterality: Right;  . TOTAL KNEE ARTHROPLASTY Left 11/07/2015   Procedure: LEFT TOTAL KNEE ARTHROPLASTY WITH REVISION OF IMPLANTS;  Surgeon: Leandrew Koyanagi, MD;  Location: [place];  Service: Orthopedics;  Laterality: Left;  . TUBAL LIGATION           Family History  Problem Relation Age of Onset  . Cancer Mother   . Heart disease Father   . Cancer Sister   . Hypertension Sister   . Cancer Brother   . Diabetes Brother   . Hypertension Brother     Social History:   Lives with son(he works days). Disabled CNA--independent and was managing home prior to knee surgeries past month.  She reports that she has been smoking Cigarette--down to1/2 PPD in the past year.   She has a 17.50 pack-year smoking history. She has never used smokeless tobacco. She reports that she drinks alcohol. She reports that she does not use drugs.         Allergies  Allergen Reactions  . Compazine [Prochlorperazine] Shortness Of Breath and Swelling    TONGUE SWELLS  . Shellfish-Derived Products Anaphylaxis  . Iodinated Diagnostic Agents Hives and Rash  . Omnipaque [Iohexol] Hives          Medications Prior to Admission  Medication Sig Dispense Refill  . acetaminophen (TYLENOL) 500 MG tablet Take 1,000 mg by mouth every 6 (six) hours as needed for mild pain.    [name] amLODipine (NORVASC) 10 MG tablet Take 10 mg by mouth at bedtime.     [name] aspirin EC 81 MG tablet Take 81 mg by mouth daily.    [name]  calcitRIOL (ROCALTROL) 0.25 MCG capsule Take 0.25 mcg by mouth every Monday, Wednesday, and Friday.    . calcium carbonate (TUMS EX) 750 MG chewable tablet Chew 1 tablet by mouth 3 (three) times daily as needed for heartburn.     . ciprofloxacin (CIPRO) 250 MG tablet Take 1 tablet (250 mg total) by mouth daily. 35 tablet 0  . daptomycin (CUBICIN) IVPB Inject 420 mg into the vein every other day. Indication:  Left prosthetic knee infection Last Day of Therapy:  03/20/17 Labs - Once weekly:  CBC/D, BMP, and CPK Labs - Every other week:  ESR and CRP 17 Units 0  . Diclofenac Sodium 2 % SOLN Place 2 g onto the skin 2 (two) times daily as needed. (Patient taking differently: Place 2 g onto the skin 2 (two) times daily as needed (for arthritis). ) 1 Bottle 5  . diphenhydrAMINE (BENADRYL) 25 mg capsule Take 1 capsule (25 mg total) by mouth every 6 (six) hours as needed for itching. 30 capsule 0  . ferrous sulfate 325 (65 FE) MG tablet Take 325 mg by mouth 2 (two) times daily with a meal.     . furosemide (LASIX) 80 MG tablet Take 1 tablet (80 mg total) by mouth 2 (two) times daily. 60 tablet 0  . gabapentin (NEURONTIN) 100 MG capsule Take 1 capsule (100 mg total) by mouth 2 (two) times daily. 60 capsule 2  . hydrALAZINE (APRESOLINE) 100 MG tablet Take 100 mg by mouth 2 (two) times daily.     . insulin glargine (LANTUS) 100 UNIT/ML injection Inject 15 Units into the skin daily before breakfast.     . methocarbamol (ROBAXIN) 500 MG tablet TAKE 1 TABLET BY MOUTH EVERY 6 HOURS AS NEEDED FOR MUSCLE SPASM (Patient taking differently: TAKE 500 MG BY MOUTH EVERY 6 HOURS AS NEEDED FOR MUSCLE SPASM) 30 tablet 2  . metoprolol succinate (TOPROL-XL) 50 MG 24 hr tablet Take 50 mg by mouth 2 (two) times daily.     . Multiple Vitamin (MULTIVITAMIN WITH MINERALS) TABS tablet Take 1 tablet by mouth daily.    . nicotine (NICODERM CQ - DOSED IN MG/24 HOURS) 14 mg/24hr patch Place 1 patch (14 mg total) onto the skin daily. 28 patch 0  . omeprazole (PRILOSEC) 20 MG capsule Take 20 mg by mouth daily before breakfast.     . oxyCODONE (OXY IR/ROXICODONE) 5 MG immediate release tablet Take 5-10 mg by mouth every 6 (six) hours as needed for severe pain.    [name] Potassium Chloride ER 20 MEQ TBCR Take 20 mEq by mouth 2 (two) times daily.     . sennosides-docusate sodium (SENOKOT-S) 8.6-50 MG tablet Take 2 tablets by mouth at bedtime.    . sertraline (ZOLOFT) 100 MG tablet Take 1 tablet (100 mg total) by mouth daily. 30 tablet 6  . traMADol (ULTRAM) 50 MG tablet Take 1-2 tablets (50-100 mg total) by mouth every 6 (six) hours as needed. (Patient taking differently: Take 50-100 mg by mouth every 6 (six) hours as needed for moderate pain or severe pain. ) 60 tablet 0  . Blood Glucose Monitoring Suppl (ACCU-CHEK AVIVA PLUS) W/DEVICE KIT Check sugars TID for E11.65 (Patient not taking: Reported on 03/11/2017) 1 kit 0  . Darbepoetin Alfa (ARANESP) 100 MCG/0.5ML SOSY injection Inject 100 mcg into the skin  every 30 (thirty) days.    [name] enoxaparin (LOVENOX) 30 MG/0.3ML injection Inject 0.3 mLs (30 mg total) into the skin daily. (Patient not taking: Reported on  03/12/2017) 4.2 mL 0  . glucose blood (ACCU-CHEK AVIVA) test strip Check sugars TID for E11.65 (Patient not taking: Reported on 03/11/2017) 100 each 12  . Lancet Devices (ACCU-CHEK SOFTCLIX) lancets Check sugars TID for E11.65 (Patient not taking: Reported on 03/11/2017) 1 each 10    Home: Home Living Family/patient expects to be discharged to:: Private residence Living Arrangements: Children, Non-relatives/Friends, Other relatives Available Help at Discharge: Family, Available PRN/intermittently Type of Home: House Home Access: Stairs to enter Technical brewer of Steps: 3 Entrance Stairs-Rails: Left Home Layout: Two level, Able to live on main level with bedroom/bathroom Bathroom Shower/Tub: Multimedia programmer: [place]: Environmental consultant - 2 wheels, Wheelchair - manual, Bedside commode, Cane - single point, Environmental consultant - 4 wheels, Shower seat, Hand held shower head Additional Comments: Pt reports a ramp may be being built for her to get inside with w/c  Functional History: Prior Function Level of Independence: Needs assistance Functional Status:  Mobility: Bed Mobility Overal bed mobility: Needs Assistance Bed Mobility: Supine to Sit Supine to sit: Supervision General bed mobility comments: HOB elevated, used bed rail Transfers Overall transfer level: Needs assistance Equipment used: Rolling walker (2 wheeled) Transfers: Sit to/from Stand Sit to Stand: Min guard General transfer comment: v/c's to push up from bed, increased time, mildly unsteady during transition of hands from bed to RW Ambulation/Gait Ambulation/Gait assistance: Min assist Ambulation Distance (Feet): 20 Feet Assistive device: Rolling walker (2 wheeled) Gait Pattern/deviations: Step-to pattern, Decreased stride length, Trunk  flexed General Gait Details: cues for sequencing, maintaining TDWB, and proximity of RW; Cues to keep RW still when advancing steps forward.  Pt fatigues with increased gait distance.  Pt putting more forefoot down and required cues to correct.  During last 15 feet of gait training patient reports increased pain.   Gait velocity: decreased Gait velocity interpretation: Below normal speed for age/gender  ADL:  Cognition: Cognition Overall Cognitive Status: Within Functional Limits for tasks assessed Orientation Level: Oriented X4 Cognition Arousal/Alertness: Awake/alert Behavior During Therapy: WFL for tasks assessed/performed Overall Cognitive Status: Within Functional Limits for tasks assessed   Blood pressure (!) 141/70, pulse 80, temperature 99.3 F (37.4 C), temperature source Oral, resp. rate 16, height '5\' 7"'  (1.702 m), weight 71.7 kg (158 lb), last menstrual period 03/16/2002, SpO2 99 %. Physical Exam  Vitals reviewed. Constitutional: She is oriented to person, place, and time. She appears well-developed and well-nourished.  HENT:  Head: Normocephalic and atraumatic.  Mouth/Throat: Oropharynx is clear and moist.  Eyes: Pupils are equal, round, and reactive to light. EOM are normal. Right eye exhibits no discharge. Left eye exhibits no discharge.  Neck: Normal range of motion. Neck supple.  Cardiovascular: Normal rate and regular rhythm.   Respiratory: Effort normal and breath sounds normal. No stridor. She has no wheezes.  GI: Soft. Bowel sounds are normal. She exhibits no distension. There is no tenderness.  Musculoskeletal: She exhibits tenderness. She exhibits no edema.  Neurological: She is alert and oriented to person, place, and time.  Sensory deficits RLE Motor: RUE: 4-/5 proximal to distal LUE: 4/5 proximal to distal LLE: 3+/5 proximal to distal (pain inhibition) RLE: HF limited by pain  Skin: Skin is warm and dry. She is not diaphoretic.  +VAC LLE   Psychiatric: Her behavior is normal. Judgment and thought content normal. Her affect is blunt.   Assessment/Plan: Diagnosis: Left AKA Labs independently reviewed.  Records reviewed and summated above. Clean amputation daily with soap and water Monitor incision site  for signs of infection or impending skin breakdown. Staples to remain in place for 3-4 weeks Stump shrinker, for edema control after d/c VAC Scar mobilization massaging to prevent soft tissue adherence Stump protector during therapies Prevent flexion contractures by implementing the following:              Encourage prone lying for 20-30 mins per day BID to avoid hip flexion       Contractures if medically appropriate;             Avoid prolonged sitting Post surgical pain control with oral medication Phantom limb pain control with physical modalities including desensitization techniques (gentle self massage to the residual stump,hot packs if sensation intact, [place]) and mirror therapy, TENS. If ineffective, consider pharmacological treatment for neuropathic pain (e.g gabapentin, pregabalin, amytriptalyine, duloxetine).  When using wheelchair, patient should have knee on amputated side fully extended with board under the seat cushion. Avoid injury to contralateral side  1. Does the need for close, 24 hr/day medical supervision in concert with the patient's rehab needs make it unreasonable for this patient to be served in a less intensive setting? Yes  2. Co-Morbidities requiring supervision/potential complications: acute on chronic anemia (transfuse as necessary (curently receiving transfusion) to ensure appropriate perfusion for increased activity tolerance), diastolic CHF (monitor for signs/symptoms of fluid overload), T2 DM (Monitor in accordance with exercise and adjust meds as necessary), CKD V (avoid nephrotoxic meds), post-op pain (Biofeedback training with therapies to help reduce reliance on opiate pain medications,  particularly IV dilaudid, monitor pain control during therapies, and sedation at rest and titrate to maximum efficacy to ensure participation and gains in therapies), leukocytosis (cont to monitor for signs and symptoms of infection, further workup if indicated) 3. Due to safety, skin/wound care, disease management, pain management and patient education, does the patient require 24 hr/day rehab nursing? Yes 4. Does the patient require coordinated care of a physician, rehab nurse, PT (1-2 hrs/day, 5 days/week) and OT (1-2 hrs/day, 5 days/week) to address physical and functional deficits in the context of the above medical diagnosis(es)? Yes Addressing deficits in the following areas: balance, endurance, locomotion, strength, transferring, bathing, dressing, toileting and psychosocial support 5. Can the patient actively participate in an intensive therapy program of at least 3 hrs of therapy per day at least 5 days per week? No 6. The potential for patient to make measurable gains while on inpatient rehab is good 7. Anticipated functional outcomes upon discharge from inpatient rehab are supervision and min assist  with PT, supervision and min assist with OT, n/a with SLP. 8. Estimated rehab length of stay to reach the above functional goals is: 16-19 days. 9. Anticipated D/C setting: Other 10. Anticipated post D/C treatments: SNF 11. Overall Rehab/Functional Prognosis: good  RECOMMENDATIONS: This patient's condition is appropriate for continued rehabilitative care in the following setting: Pt states she would [facility] to go to [facility].  Further, pt unable to tolerate 3 hours therapy/day at present due to pain and weakness.  Will cont to follow for progress. Patient has agreed to participate in recommended program. Potentially Note that insurance prior authorization may be required for reimbursement for recommended care.  Comment: Rehab Admissions Coordinator to follow up.  Delice Lesch, MD,  ABPMR [place] [place], [place] 03/24/2017

## 2017-03-26 NOTE — Progress Notes (Signed)
Brandy Gong, RN Rehab Admission Coordinator Signed Physical Medicine and Rehabilitation  PMR Pre-admission Date of Service: 03/25/2017 4:04 PM  Related encounter: Admission (Current) from 03/10/2017 in [place]       [] Hide copied text PMR Admission Coordinator Pre-Admission Assessment  Patient: Brandy Houston is an 61 y.o., female MRN: [id number] DOB: [birth year] Height: 5\' 7"  (170.2 cm) Weight: 71.7 kg (158 lb)                                                                                                                                                  Insurance Information HMO:     PPO:      PCP:      IPA:      80/20: yes     OTHER: no HMO PRIMARY: Medicare a and b      Policy#: [id number]      Subscriber: pt Benefits:  Phone #: online     Name: 03/24/2017 Eff. Date: 01/14/2017     Deduct: $1340      Out of Pocket Max: none      Life Max: none CIR: 100%      SNF: 20 full days Outpatient: 80%     Co-Pay: 20% Home Health: 100 %      Co-Pay: none DME: 80%    Co-Pay: 20% Providers: pt choice  SECONDARY: Medicaid [place] Access      Policy#: [id number]      Subscriber: pt  Medicaid Application Date:       Case Manager:  Disability Application Date:       Case Worker:   Emergency Contact Information        Contact Information    Name Relation Home Work [place] Son   [phone]   Nunnery,[name] Daughter   [phone]     Current Medical History  Patient Admitting Diagnosis: left AKA  History of Present Illness: HPI: Brandy Houston a 61 y.o.femalewith history of diastolic CHF, O2VO, CKD- V, septic arthritis left knee s/p I and D with antibiotic therapy but continued to poor wound healing who was admitted on 03/10/17 from [facility] with dehiscence of wound. She failed attempts at limb salvage and required L-AKA on 10/5 by Dr. Sharol Given. Post op has had issues with dizziness as well as acute on chronic anemia  requiring transfusions, currently receiving a transfusion. Therapy ongoing with patient making good progress. She has had significant decline in mobility and ability to carryout ADLs. CIR recommended by MD for follow up therapy.    Past Medical History  Past Medical History:  Diagnosis Date  . Anemia   . Anxiety   . Arthritis    "in my joints" (03/10/2017)  . Chronic diastolic CHF (congestive heart failure) ([place])   . CKD (chronic kidney disease), stage  IV ([place])    stage IV. previous HD, none currently 10/30/15 (confirmed 02/05/2017 & 03/10/2017)  . Depression    Chronic  . Diabetic peripheral neuropathy ([place])   . Dysrhythmia    tachycardia, normal ECHO 08-09-14  . GERD (gastroesophageal reflux disease)   . Heart murmur     dx'd 02/05/2017  . Hepatitis C    "tx'd in 2016; I'm negative now" (02/05/2017)  . History of blood transfusion 2017   "w/knee replacement"  . Hypertension   . Osteoarthritis of left knee   . Peripheral neuropathy   . Protein calorie malnutrition ([place])   . Septic arthritis ([place])   . Slow transit constipation   . Type II diabetes mellitus ([name])    IDDM  . Uncontrolled hypertension 02/18/2013  . Unsteady gait     Family History  family history includes Cancer in her brother, mother, and sister; Diabetes in her brother; Heart disease in her father; Hypertension in her brother and sister.  Prior Rehab/Hospitalizations:  Has the patient had major surgery during 100 days prior to admission? Yes  Recent stay at [facility] for antibiotics  Current Medications   Current Facility-Administered Medications:  .  0.9 %  sodium chloride infusion, 250 mL, Intravenous, PRN, Arrien, Jimmy Picket, MD, Last Rate: 0 mL/hr at 03/11/17 0700 .  0.9 %  sodium chloride infusion, , Intravenous, Continuous, Lyndle Herrlich, MD .  0.9 %  sodium chloride infusion, , Intravenous, Continuous, Newt Minion, MD, Last Rate: 10 mL/hr at 03/20/17 1148 .   acetaminophen (TYLENOL) tablet 650 mg, 650 mg, Oral, Q6H PRN, 650 mg at 03/24/17 1056 **OR** acetaminophen (TYLENOL) suppository 650 mg, 650 mg, Rectal, Q6H PRN, Arrien, Jimmy Picket, MD .  amLODipine (NORVASC) tablet 10 mg, 10 mg, Oral, QHS, Arrien, Jimmy Picket, MD, 10 mg at 03/24/17 2158 .  bisacodyl (DULCOLAX) suppository 10 mg, 10 mg, Rectal, Daily PRN, Newt Minion, MD .  calcitRIOL (ROCALTROL) capsule 0.25 mcg, 0.25 mcg, Oral, Q M,W,F, Arrien, Jimmy Picket, MD, 0.25 mcg at 03/25/17 0956 .  calcium carbonate (TUMS - dosed in mg elemental calcium) chewable tablet 200 mg of elemental calcium, 1 tablet, Oral, TID PRN, Arrien, Jimmy Picket, MD .  diphenhydrAMINE (BENADRYL) capsule 25 mg, 25 mg, Oral, Q6H PRN, Arrien, Jimmy Picket, MD, 25 mg at 03/21/17 1721 .  docusate sodium (COLACE) capsule 100 mg, 100 mg, Oral, BID, Newt Minion, MD, 100 mg at 03/25/17 0956 .  ferrous sulfate tablet 325 mg, 325 mg, Oral, BID WC, Arrien, Jimmy Picket, MD, 325 mg at 03/25/17 0835 .  furosemide (LASIX) tablet 80 mg, 80 mg, Oral, BID, Arrien, Jimmy Picket, MD, 80 mg at 03/25/17 0835 .  gabapentin (NEURONTIN) capsule 100 mg, 100 mg, Oral, BID, Alekh, Kshitiz, MD, 100 mg at 03/25/17 0956 .  hydrALAZINE (APRESOLINE) tablet 100 mg, 100 mg, Oral, BID, Arrien, Jimmy Picket, MD, 100 mg at 03/25/17 0956 .  HYDROmorphone (DILAUDID) injection 1 mg, 1 mg, Intravenous, Q2H PRN, Newt Minion, MD, 1 mg at 03/24/17 2012 .  insulin aspart (novoLOG) injection 0-9 Units, 0-9 Units, Subcutaneous, TID WC, Arrien, Jimmy Picket, MD, 1 Units at 03/24/17 1312 .  insulin glargine (LANTUS) injection 15 Units, 15 Units, Subcutaneous, QAC breakfast, Arrien, Jimmy Picket, MD, 15 Units at 03/25/17 [phone] .  loratadine (CLARITIN) tablet 10 mg, 10 mg, Oral, Daily PRN, Starla Link, Kshitiz, MD .  magnesium citrate solution 1 Bottle, 1 Bottle, Oral, Once PRN, Newt Minion, MD .  methocarbamol (ROBAXIN)  tablet 500 mg,  500 mg, Oral, Q6H PRN, 500 mg at 03/25/17 1120 **OR** methocarbamol (ROBAXIN) 500 mg in dextrose 5 % 50 mL IVPB, 500 mg, Intravenous, Q6H PRN, Newt Minion, MD .  metoprolol succinate (TOPROL-XL) 24 hr tablet 50 mg, 50 mg, Oral, BID, Arrien, Jimmy Picket, MD, 50 mg at 03/25/17 0956 .  multivitamin with minerals tablet 1 tablet, 1 tablet, Oral, Daily, Arrien, Jimmy Picket, MD, 1 tablet at 03/25/17 0956 .  nicotine (NICODERM CQ - dosed in mg/24 hours) patch 14 mg, 14 mg, Transdermal, Daily, Arrien, Jimmy Picket, MD, 14 mg at 03/25/17 0956 .  ondansetron (ZOFRAN) tablet 4 mg, 4 mg, Oral, Q6H PRN, 4 mg at 03/11/17 1338 **OR** ondansetron (ZOFRAN) injection 4 mg, 4 mg, Intravenous, Q6H PRN, Arrien, Jimmy Picket, MD, 4 mg at 03/13/17 1002 .  oxyCODONE (Oxy IR/ROXICODONE) immediate release tablet 5-10 mg, 5-10 mg, Oral, Q4H PRN, Samuella Cota, MD, 10 mg at 03/25/17 1315 .  pantoprazole (PROTONIX) EC tablet 40 mg, 40 mg, Oral, Daily, Arrien, Jimmy Picket, MD, 40 mg at 03/25/17 0956 .  polyethylene glycol (MIRALAX / GLYCOLAX) packet 17 g, 17 g, Oral, Daily PRN, Newt Minion, MD .  senna-docusate (Senokot-S) tablet 2 tablet, 2 tablet, Oral, QHS, Arrien, Jimmy Picket, MD, 2 tablet at 03/24/17 2158 .  sertraline (ZOLOFT) tablet 100 mg, 100 mg, Oral, Daily, Arrien, Jimmy Picket, MD, 100 mg at 03/25/17 0956 .  sodium chloride flush (NS) 0.9 % injection 10-40 mL, 10-40 mL, Intracatheter, PRN, Arrien, Jimmy Picket, MD, 30 mL at 03/25/17 0419 .  sodium chloride flush (NS) 0.9 % injection 3 mL, 3 mL, Intravenous, Q12H, Arrien, Jimmy Picket, MD, 3 mL at 03/25/17 1000 .  sodium chloride flush (NS) 0.9 % injection 3 mL, 3 mL, Intravenous, PRN, Arrien, Jimmy Picket, MD, 3 mL at 03/22/17 0918 .  traZODone (DESYREL) tablet 50 mg, 50 mg, Oral, QHS, Samuella Cota, MD, 50 mg at 03/24/17 2158  Patients Current Diet: Diet Carb Modified Fluid consistency: Thin; Room service appropriate?  Yes  Precautions / Restrictions Precautions Precautions: Fall Precaution Comments: wound VAC Restrictions Weight Bearing Restrictions: Yes LLE Weight Bearing: Non weight bearing  VAC discontinued 03/25/17  Has the patient had 2 or more falls or a fall with injury in the past year?No  Prior Activity Level Community (5-7x/wk): was independent and driving pta. Prior to 02/06/17, but was Mod I on RW and independent with all adls. Used [facility] transportation for appointments.  Home Assistive Devices / Equipment Home Assistive Devices/Equipment: Environmental consultant (specify type), Dentures (specify type), Eyeglasses, CBG Meter, Grab bars around toilet, Grab bars in shower, Hand-held shower hose, Shower chair without back, Wheelchair Home Equipment: Environmental consultant - 2 wheels, Wheelchair - manual, Bedside commode, Cane - single point, Environmental consultant - 4 wheels, Shower seat, Hand held shower head  Prior Device Use: Indicate devices/aids used by the patient prior to current illness, exacerbation or injury? Manual wheelchair and Walker  Prior Functional Level   Was Mod I RW prior to 02/06/2017.  Prior Function Level of Independence: Needs assistance ADL's / Homemaking Assistance Needed: family does the homemaking; Pt reports family was assisting with ADLs   Self Care: Did the patient need help bathing, dressing, using the toilet or eating?  Needed some help  Indoor Mobility: Did the patient need assistance with walking from room to room (with or without device)? Needed some help  Stairs: Did the patient need assistance with internal or external stairs (with or without  device)? Needed some help  Functional Cognition: Did the patient need help planning regular tasks such as shopping or remembering to take medications? Needed some help  Current Functional Level Cognition  Overall Cognitive Status: Within Functional Limits for tasks assessed Orientation Level: Oriented X4 General Comments: pt with varied  level of assist reported when attempting to obtain PLOF    Extremity Assessment (includes Sensation/Coordination)  Upper Extremity Assessment: Overall WFL for tasks assessed  Lower Extremity Assessment: LLE deficits/detail LLE Deficits / Details: LLE NWB; able to accept full weight onto R LE during static stance    ADLs       Mobility  Overal bed mobility: Needs Assistance Bed Mobility: Supine to Sit Supine to sit: Supervision General bed mobility comments: supervision for safety    Transfers  Overall transfer level: Needs assistance Equipment used: Rolling walker (2 wheeled) Transfers: Sit to/from Stand Sit to Stand: Min guard General transfer comment: min guard for safety    Ambulation / Gait / Stairs / Wheelchair Mobility  Ambulation/Gait Ambulation/Gait assistance: Physicist, medical (Feet): 40 Feet Assistive device: Rolling walker (2 wheeled) Gait Pattern/deviations: Trunk flexed (hop-to on R LE) General Gait Details: mild instability but no overt LOB, mostly limited secondary to increased pain in LLE residual limb, min guard for safety. pt requiring frequent standing rest breaks secondary to bilateral UE fatigue and pain. Gait velocity: Decreased Gait velocity interpretation: Below normal speed for age/gender    Posture / Balance Balance Overall balance assessment: Needs assistance Sitting-balance support: Feet supported, No upper extremity supported Sitting balance-Leahy Scale: Good Standing balance support: Bilateral upper extremity supported Standing balance-Leahy Scale: Poor Standing balance comment: Reliant on BUE support    Special needs/care consideration BiPAP/CPAP  N/a CPM  N/a Continuous Drip IV  N/a Dialysis  N/a Life Vest  N/a Oxygen  N/a Special Bed  N/a Trach Size  N/a Wound Vac discontinued 03/25/2017 by Dr. Sharol Given Skin surgical incision ; LUE arm graft nonfunctioning                          Bowel mgmt: 03/24/17  continent Bladder mgmt: continent Diabetic mgmt yes pta   Previous Home Environment Living Arrangements: Children, Non-relatives/Friends, Other relatives  Lives With: Son Available Help at Discharge:  (son works days) Type of Home: House Home Layout: Two level, Able to live on main level with bedroom/bathroom Home Access: Stairs to enter Entrance Stairs-Rails: Left Entrance Stairs-Number of Steps: 3 Bathroom Shower/Tub: Multimedia programmer: [place]: Yes Type of Home Care Services: [place] aide, Home OT, Home PT, Friend (if known): [place]; [facility]? Additional Comments: Pt reports a ramp may be being built for her to get inside with w/c   Patient lives with son and his girlfriend and 14 and 55 year old grandchildren. Son works 1 pm until 19 pm. His girlfriend and the kids come home about 3 or 3:30 pm daily.   Discharge Living Setting Plans for Discharge Living Setting: Patient's home, Lives with (comment) (son) Type of Home at Discharge: House Discharge Home Layout: Two level, Able to live on main level with bedroom/bathroom Discharge Home Access: Stairs to enter Entrance Stairs-Rails: Left Entrance Stairs-Number of Steps: 3 Discharge Bathroom Shower/Tub: Walk-in shower Discharge Bathroom Toilet: Standard Does the patient have any problems obtaining your medications?: No  Social/Family/Support Systems Patient Roles: Parent Contact Information: Marya Amsler, son Anticipated Caregiver: son Anticipated Caregiver's Contact Information: see above  Ability/Limitations of Caregiver: son works Careers adviser: Intermittent Discharge Plan Discussed with Primary Caregiver: Yes Is Caregiver In Agreement with Plan?: Yes Does Caregiver/Family have Issues with Lodging/Transportation while Pt is in Rehab?: No  Goals/Additional Needs Patient/Family Goal for Rehab: Mod I to supervision with PT, Mod I to min assist with  OT Expected length of stay: ELOS 10- 14 days Pt/Family Agrees to Admission and willing to participate: Yes Program Orientation Provided & Reviewed with Pt/Caregiver Including Roles  & Responsibilities: Yes  Barriers to Discharge: Decreased caregiver support  Decrease burden of Care through IP rehab admission: n/a  Possible need for SNF placement upon discharge: not anticipated  Patient Condition: This patient's medical and functional status has changed since the consult dated: 03/24/2017 in which the Rehabilitation Physician determined and documented that the patient's condition is appropriate for intensive rehabilitative care in an inpatient rehabilitation facility. See "History of Present Illness" (above) for medical update. Functional changes are: min assist. Patient's medical and functional status update has been discussed with the Rehabilitation physician and patient remains appropriate for inpatient rehabilitation. Will admit to inpatient rehab today.  Preadmission Screen Completed By:  Cleatrice Burke, 03/25/2017 4:04 PM ______________________________________________________________________   Discussed status with Dr. Letta Pate on 03/26/2017 at  1228 and received telephone approval for admission today.  Admission Coordinator:  Cleatrice Burke, time 1228 Date 03/26/2017       Cosigned by: Charlett Blake, MD at 03/26/2017 12:32 PM  Revision History

## 2017-03-27 ENCOUNTER — Inpatient Hospital Stay (HOSPITAL_COMMUNITY): Payer: Medicare Other | Admitting: Occupational Therapy

## 2017-03-27 ENCOUNTER — Inpatient Hospital Stay (HOSPITAL_COMMUNITY): Payer: Medicare Other

## 2017-03-27 ENCOUNTER — Encounter (HOSPITAL_COMMUNITY): Payer: Self-pay

## 2017-03-27 ENCOUNTER — Inpatient Hospital Stay (HOSPITAL_COMMUNITY): Payer: Medicare Other | Admitting: Physical Therapy

## 2017-03-27 LAB — CBC WITH DIFFERENTIAL/PLATELET
Basophils Absolute: 0 10*3/uL (ref 0.0–0.1)
Basophils Relative: 0 %
Eosinophils Absolute: 0.2 10*3/uL (ref 0.0–0.7)
Eosinophils Relative: 2 %
HCT: 26.6 % — ABNORMAL LOW (ref 36.0–46.0)
Hemoglobin: 8.7 g/dL — ABNORMAL LOW (ref 12.0–15.0)
Lymphocytes Relative: 12 %
Lymphs Abs: 1.1 10*3/uL (ref 0.7–4.0)
MCH: 28.6 pg (ref 26.0–34.0)
MCHC: 32.7 g/dL (ref 30.0–36.0)
MCV: 87.5 fL (ref 78.0–100.0)
Monocytes Absolute: 1 10*3/uL (ref 0.1–1.0)
Monocytes Relative: 10 %
Neutro Abs: 7.2 10*3/uL (ref 1.7–7.7)
Neutrophils Relative %: 76 %
Platelets: 278 10*3/uL (ref 150–400)
RBC: 3.04 MIL/uL — ABNORMAL LOW (ref 3.87–5.11)
RDW: 15.9 % — ABNORMAL HIGH (ref 11.5–15.5)
WBC: 9.5 10*3/uL (ref 4.0–10.5)

## 2017-03-27 LAB — COMPREHENSIVE METABOLIC PANEL
ALT: 10 U/L — ABNORMAL LOW (ref 14–54)
AST: 11 U/L — ABNORMAL LOW (ref 15–41)
Albumin: 2 g/dL — ABNORMAL LOW (ref 3.5–5.0)
Alkaline Phosphatase: 80 U/L (ref 38–126)
Anion gap: 9 (ref 5–15)
BUN: 60 mg/dL — ABNORMAL HIGH (ref 6–20)
CO2: 22 mmol/L (ref 22–32)
Calcium: 8.4 mg/dL — ABNORMAL LOW (ref 8.9–10.3)
Chloride: 105 mmol/L (ref 101–111)
Creatinine, Ser: 3.22 mg/dL — ABNORMAL HIGH (ref 0.44–1.00)
GFR calc Af Amer: 17 mL/min — ABNORMAL LOW (ref >60)
GFR calc non Af Amer: 14 mL/min — ABNORMAL LOW (ref >60)
Glucose, Bld: 89 mg/dL (ref 65–99)
Potassium: 3.7 mmol/L (ref 3.5–5.1)
Sodium: 136 mmol/L (ref 135–145)
Total Bilirubin: 0.8 mg/dL (ref 0.3–1.2)
Total Protein: 5.9 g/dL — ABNORMAL LOW (ref 6.5–8.1)

## 2017-03-27 LAB — BPAM RBC
Blood Product Expiration Date: 201810302359
ISSUE DATE / TIME: 201810111505
Unit Type and Rh: 6200

## 2017-03-27 LAB — GLUCOSE, CAPILLARY
Glucose-Capillary: 79 mg/dL (ref 65–99)
Glucose-Capillary: 105 mg/dL — ABNORMAL HIGH (ref 65–99)
Glucose-Capillary: 95 mg/dL (ref 65–99)
Glucose-Capillary: 126 mg/dL — ABNORMAL HIGH (ref 65–99)

## 2017-03-27 LAB — TYPE AND SCREEN
ABO/RH(D): A POS
Antibody Screen: NEGATIVE
Unit division: 0

## 2017-03-27 LAB — OCCULT BLOOD X 1 CARD TO LAB, STOOL: Fecal Occult Bld: NEGATIVE

## 2017-03-27 MED ORDER — TRAMADOL HCL 50 MG PO TABS
50.0000 mg | ORAL_TABLET | Freq: Four times a day (QID) | ORAL | Status: DC | PRN
  Administered 2017-03-27 – 2017-04-01 (×4): 50 mg via ORAL
  Filled 2017-03-27 (×4): qty 1

## 2017-03-27 MED ORDER — POLYETHYLENE GLYCOL 3350 17 G PO PACK
17.0000 g | PACK | Freq: Two times a day (BID) | ORAL | Status: DC
  Administered 2017-03-27 – 2017-03-31 (×9): 17 g via ORAL
  Filled 2017-03-27 (×10): qty 1

## 2017-03-27 MED ORDER — SENNOSIDES-DOCUSATE SODIUM 8.6-50 MG PO TABS: 2.0000 | ORAL_TABLET | Freq: Every evening | ORAL | Status: DC | PRN

## 2017-03-27 MED ORDER — PREMIER PROTEIN SHAKE
2.0000 [oz_av] | Freq: Three times a day (TID) | ORAL | Status: DC
  Administered 2017-03-27 – 2017-03-31 (×9): 2 [oz_av] via ORAL
  Filled 2017-03-27 (×21): qty 325.31

## 2017-03-27 MED ORDER — INFLUENZA VAC SPLIT QUAD 0.5 ML IM SUSY
0.5000 mL | PREFILLED_SYRINGE | INTRAMUSCULAR | Status: AC
  Administered 2017-04-01: 0.5 mL via INTRAMUSCULAR
  Filled 2017-03-27: qty 0.5

## 2017-03-27 MED ORDER — GABAPENTIN 100 MG PO CAPS
100.0000 mg | ORAL_CAPSULE | Freq: Three times a day (TID) | ORAL | Status: DC
  Administered 2017-03-27 – 2017-04-01 (×14): 100 mg via ORAL
  Filled 2017-03-27 (×14): qty 1

## 2017-03-27 NOTE — Progress Notes (Signed)
Nutrition Brief Note  Patient identified on the Malnutrition Screening Tool (MST) Report  Wt Readings from Last 15 Encounters:  03/27/17 155 lb 11.2 oz (70.6 kg)  03/20/17 158 lb (71.7 kg)  02/18/17 157 lb 12.8 oz (71.6 kg)  01/19/17 143 lb 8 oz (65.1 kg)  01/12/17 150 lb (68 kg)  01/05/17 149 lb 6.4 oz (67.8 kg)  12/29/16 146 lb 4.8 oz (66.4 kg)  12/09/16 154 lb 3.2 oz (69.9 kg)  01/24/16 156 lb (70.8 kg)  12/10/15 160 lb (72.6 kg)  11/29/15 170 lb 3.2 oz (77.2 kg)  11/16/15 170 lb 3.2 oz (77.2 kg)  11/14/15 170 lb 3.2 oz (77.2 kg)  11/13/15 170 lb 3.1 oz (77.2 kg)  10/29/15 158 lb 12.8 oz (72 kg)    Body mass index is 24.39 kg/m. Patient meets criteria for normal based on current BMI.   Current diet order is carbohydrate modified, patient is consuming approximately 75% of meals at this time. Pt reports having a good appetite currently and PTA with usual consumption of at least 3 meals a day with no other difficulties. Labs and medications reviewed.   No nutrition interventions warranted at this time. If nutrition issues arise, please consult RD.   Corrin Parker, MS, RD, LDN Pager # [phone] After hours/ weekend pager # [phone]

## 2017-03-27 NOTE — Plan of Care (Signed)
Problem: RH PAIN MANAGEMENT Goal: RH STG PAIN MANAGED AT OR BELOW PT'S PAIN GOAL Pain at or below 3 with mod I assist  Outcome: Not Progressing Pt experiencing neuropathic pain and pain levels remain above 5 with medications. Pt on appropriate dose of gabapentin based on kidney function. Tramadol added to pain management regimen. Pt educated on desensitizing limb with light touch and massage to help with pain relief.

## 2017-03-27 NOTE — Care Management Note (Signed)
[place] Individual Statement of Services  Patient Name:  Brandy Houston  Date:  03/27/2017  Welcome to the [facility].  Our goal is to provide you with an individualized program based on your diagnosis and situation, designed to meet your specific needs.  With this comprehensive rehabilitation program, you will be expected to participate in at least 3 hours of rehabilitation therapies Monday-Friday, with modified therapy programming on the weekends.  Your rehabilitation program will include the following services:  Physical Therapy (PT), Occupational Therapy (OT), 24 hour per day rehabilitation nursing, Therapeutic Recreaction (TR), Neuropsychology, Case Management (Social Worker), Rehabilitation Medicine, Nutrition Services and Pharmacy Services  Weekly team conferences will be held on Wednesdays to discuss your progress.  Your Social Worker will talk with you frequently to get your input and to update you on team discussions.  Team conferences with you and your family in attendance may also be held.  Expected length of stay: 5-7 days  Overall anticipated outcome: modified independent  Depending on your progress and recovery, your program may change. Your Social Worker will coordinate services and will keep you informed of any changes. Your Social Worker's name and contact numbers are listed  below.  The following services may also be recommended but are not provided by the [place] will be made to provide these services after discharge if needed.  Arrangements include referral to agencies that provide these services.  Your insurance has been verified to be:  Medicare and Medicaid Your primary doctor is:  Billey Chang  Pertinent information will be shared with your doctor and your insurance company.  Social  Worker:  [place], [place] or [phone]   Information discussed with and copy given to patient by: Lennart Pall, 03/27/2017, 2:36 PM

## 2017-03-27 NOTE — Progress Notes (Signed)
Pt arrived to 4W08 , alert and in no distress.  Oriented to unit, call bell system, therapy schedule and fall precautions.

## 2017-03-27 NOTE — H&P (Signed)
Physical Medicine and Rehabilitation Admission H&P  CC: L-AKA with significant decline in mobility and ability to carryout ALDs.  HPI: Brandy Houston is a 61 y.o. female with history of diastolic CHF, W2NF, CKD- V, septic arthritis left knee s/p I and D with antibiotic therapy but continued to poor wound healing who was admitted on 03/10/17 from [facility] with dehiscence of wound. History taken from chart review. She failed attempts at limb salvage and required L-AKA on 10/5 by Dr. Sharol Given. Post op has had issues with dizziness as well as acute on chronic anemia requiring multiple transfusions. Therapy ongoing with patient making good progress. She has had significant decline in mobility and ability to carryout ALDs. CIR recommended by MD for follow up therapy.  Review of Systems  Constitutional: Negative for fever.  HENT: Negative for hearing loss.  Eyes: Negative for double vision.  Respiratory: Negative for cough and hemoptysis.  Cardiovascular: Negative for chest pain.  Gastrointestinal: Negative for nausea and vomiting.  Genitourinary: Negative for dysuria.  Musculoskeletal: Positive for joint pain and myalgias.  Skin: Negative for itching.  Neurological: Negative for sensory change and speech change.  Psychiatric/Behavioral: The patient is not nervous/anxious.       Past Medical History:  Diagnosis Date  . Anemia   . Anxiety   . Arthritis    "in my joints" (03/10/2017)  . Chronic diastolic CHF (congestive heart failure) ([place])   . CKD (chronic kidney disease), stage IV ([place])    stage IV. previous HD, none currently 10/30/15 (confirmed 02/05/2017 & 03/10/2017)  . Depression    Chronic  . Diabetic peripheral neuropathy ([place])   . Dysrhythmia    tachycardia, normal ECHO 08-09-14  . GERD (gastroesophageal reflux disease)   . Heart murmur    dx'd 02/05/2017  . Hepatitis C    "tx'd in 2016; I'm negative now" (02/05/2017)  . History of blood transfusion 2017   "w/knee replacement"  .  Hypertension   . Osteoarthritis of left knee   . Peripheral neuropathy   . Protein calorie malnutrition ([place])   . Septic arthritis ([place])   . Slow transit constipation   . Type II diabetes mellitus ([name])    IDDM  . Uncontrolled hypertension 02/18/2013  . Unsteady gait         Past Surgical History:  Procedure Laterality Date  . AMPUTATION Left 03/20/2017   Procedure: LEFT ABOVE KNEE AMPUTATION; Surgeon: Newt Minion, MD; Location: [place]; Service: Orthopedics; Laterality: Left;  . APPENDECTOMY    . AV FISTULA PLACEMENT Left 09/13/2014   Procedure: Brachial Artery to Brachial Vein Gortex Four - Seven Stretch GRAFT INSERTION Left Forearm; Surgeon: Mal Misty, MD; Location: [place]; Service: Vascular; Laterality: Left;  . CHOLECYSTECTOMY OPEN    . COLON SURGERY    . EXCISIONAL TOTAL KNEE ARTHROPLASTY WITH ANTIBIOTIC SPACERS Left 02/06/2017   Procedure: Incisional total iknee with antibiotic spacer ; Surgeon: Leandrew Koyanagi, MD; Location: [place]; Service: Orthopedics; Laterality: Left;  . IR FLUORO GUIDE CV LINE RIGHT  02/10/2017  . IR US GUIDE VASC ACCESS RIGHT  02/10/2017  . IRRIGATION AND DEBRIDEMENT KNEE Left 03/12/2017   Procedure: IRRIGATION AND DEBRIDEMENT LEFT KNEE WITH WOUND VAC APPLICATION; Surgeon: Leandrew Koyanagi, MD; Location: [place]; Service: Orthopedics; Laterality: Left;  . JOINT REPLACEMENT    . KNEE ARTHROSCOPY Right 08/10/2014   Procedure: ARTHROSCOPY I & D KNEE; Surgeon: Marianna Payment, MD; Location: [facility]; Service: Orthopedics; Laterality: Right;  . KNEE  ARTHROSCOPY Left 08/11/2014   Procedure: ARTHROSCOPIC WASHOUT LEFT KNEE; Surgeon: Marianna Payment, MD; Location: [place]; Service: Orthopedics; Laterality: Left;  . KNEE ARTHROSCOPY Left 08/19/2014   Procedure: ARTHROSCOPIC WASHOUT LEFT KNEE; Surgeon: Leandrew Koyanagi, MD; Location: [place]; Service: Orthopedics; Laterality: Left;  . KNEE ARTHROSCOPY WITH LATERAL MENISECTOMY Left 04/04/2015   Procedure: AND PARTIAL LATERAL  MENISECTOMY; Surgeon: Leandrew Koyanagi, MD; Location: [place]; Service: Orthopedics; Laterality: Left;  . KNEE ARTHROSCOPY WITH MEDIAL MENISECTOMY Left 04/04/2015   Procedure: LEFT KNEE ARTHROSCOPY WITH PARTIAL MEDIAL MENISCECTOMY AND SYNOVECTOMY; Surgeon: Leandrew Koyanagi, MD; Location: [place]; Service: Orthopedics; Laterality: Left;  . SHOULDER ARTHROSCOPY Bilateral 08/10/2014   Procedure: I & D BILATERAL SHOULDERS ; Surgeon: Marianna Payment, MD; Location: [facility]; Service: Orthopedics; Laterality: Bilateral;  . SMALL INTESTINE SURGERY     Due to Small Bowel Obstruction; "fixed it when they did my gallbladder OR"  . TEE WITHOUT CARDIOVERSION N/A 08/14/2014   Procedure: TRANSESOPHAGEAL ECHOCARDIOGRAM (TEE); Surgeon: Thayer Headings, MD; Location: [place]; Service: Cardiovascular; Laterality: N/A;  . TENOSYNOVECTOMY Right 08/11/2014   Procedure: RIGHT WRIST IRRIGATION AND DEBRIDEMENT, TENOSYNOVECTOMY; Surgeon: Marianna Payment, MD; Location: [place]; Service: Orthopedics; Laterality: Right;  . TOTAL KNEE ARTHROPLASTY Left 11/07/2015   Procedure: LEFT TOTAL KNEE ARTHROPLASTY WITH REVISION OF IMPLANTS; Surgeon: Leandrew Koyanagi, MD; Location: [place]; Service: Orthopedics; Laterality: Left;  . TUBAL LIGATION          Family History  Problem Relation Age of Onset  . Cancer Mother   . Heart disease Father   . Cancer Sister   . Hypertension Sister   . Cancer Brother   . Diabetes Brother   . Hypertension Brother    Social History: Lives with son(he works days). Disabled CNA--independent and was managing home prior to knee surgeries past month. She reports that she has been smoking Cigarette--down to1/2 PPD in the past year. She has a 17.50 pack-year smoking history. She has never used smokeless tobacco. She reports that she drinks alcohol. She reports that she does not use drugs.       Allergies  Allergen Reactions  . Compazine [Prochlorperazine] Shortness Of Breath  and Swelling    TONGUE SWELLS  . Shellfish-Derived Products Anaphylaxis  . Iodinated Diagnostic Agents Hives and Rash  . Omnipaque [Iohexol] Hives         Medications Prior to Admission  Medication Sig Dispense Refill  . acetaminophen (TYLENOL) 500 MG tablet Take 1,000 mg by mouth every 6 (six) hours as needed for mild pain.    [name] amLODipine (NORVASC) 10 MG tablet Take 10 mg by mouth at bedtime.     [name] aspirin EC 81 MG tablet Take 81 mg by mouth daily.    . calcitRIOL (ROCALTROL) 0.25 MCG capsule Take 0.25 mcg by mouth every Monday, Wednesday, and Friday.    . calcium carbonate (TUMS EX) 750 MG chewable tablet Chew 1 tablet by mouth 3 (three) times daily as needed for heartburn.     . [EXPIRED] ciprofloxacin (CIPRO) 250 MG tablet Take 1 tablet (250 mg total) by mouth daily. 35 tablet 0  . [EXPIRED] daptomycin (CUBICIN) IVPB Inject 420 mg into the vein every other day. Indication: Left prosthetic knee infection  Last Day of Therapy: 03/20/17  Labs - Once weekly: CBC/D, BMP, and CPK  Labs - Every other week: ESR and CRP 17 Units 0  . Diclofenac Sodium 2 % SOLN Place 2 g onto  the skin 2 (two) times daily as needed. (Patient taking differently: Place 2 g onto the skin 2 (two) times daily as needed (for arthritis). ) 1 Bottle 5  . diphenhydrAMINE (BENADRYL) 25 mg capsule Take 1 capsule (25 mg total) by mouth every 6 (six) hours as needed for itching. 30 capsule 0  . ferrous sulfate 325 (65 FE) MG tablet Take 325 mg by mouth 2 (two) times daily with a meal.    . furosemide (LASIX) 80 MG tablet Take 1 tablet (80 mg total) by mouth 2 (two) times daily. 60 tablet 0  . gabapentin (NEURONTIN) 100 MG capsule Take 1 capsule (100 mg total) by mouth 2 (two) times daily. 60 capsule 2  . hydrALAZINE (APRESOLINE) 100 MG tablet Take 100 mg by mouth 2 (two) times daily.     . insulin glargine (LANTUS) 100 UNIT/ML injection Inject 15 Units into the skin daily before breakfast.     . methocarbamol (ROBAXIN) 500 MG  tablet TAKE 1 TABLET BY MOUTH EVERY 6 HOURS AS NEEDED FOR MUSCLE SPASM (Patient taking differently: TAKE 500 MG BY MOUTH EVERY 6 HOURS AS NEEDED FOR MUSCLE SPASM) 30 tablet 2  . metoprolol succinate (TOPROL-XL) 50 MG 24 hr tablet Take 50 mg by mouth 2 (two) times daily.     . Multiple Vitamin (MULTIVITAMIN WITH MINERALS) TABS tablet Take 1 tablet by mouth daily.    . nicotine (NICODERM CQ - DOSED IN MG/24 HOURS) 14 mg/24hr patch Place 1 patch (14 mg total) onto the skin daily. 28 patch 0  . omeprazole (PRILOSEC) 20 MG capsule Take 20 mg by mouth daily before breakfast.     . oxyCODONE (OXY IR/ROXICODONE) 5 MG immediate release tablet Take 5-10 mg by mouth every 6 (six) hours as needed for severe pain.    [name] Potassium Chloride ER 20 MEQ TBCR Take 20 mEq by mouth 2 (two) times daily.     . sennosides-docusate sodium (SENOKOT-S) 8.6-50 MG tablet Take 2 tablets by mouth at bedtime.    . sertraline (ZOLOFT) 100 MG tablet Take 1 tablet (100 mg total) by mouth daily. 30 tablet 6  . traMADol (ULTRAM) 50 MG tablet Take 1-2 tablets (50-100 mg total) by mouth every 6 (six) hours as needed. (Patient taking differently: Take 50-100 mg by mouth every 6 (six) hours as needed for moderate pain or severe pain. ) 60 tablet 0  . Blood Glucose Monitoring Suppl (ACCU-CHEK AVIVA PLUS) W/DEVICE KIT Check sugars TID for E11.65 (Patient not taking: Reported on 03/11/2017) 1 kit 0  . Darbepoetin Alfa (ARANESP) 100 MCG/0.5ML SOSY injection Inject 100 mcg into the skin every 30 (thirty) days.    [name] enoxaparin (LOVENOX) 30 MG/0.3ML injection Inject 0.3 mLs (30 mg total) into the skin daily. (Patient not taking: Reported on 03/12/2017) 4.2 mL 0  . glucose blood (ACCU-CHEK AVIVA) test strip Check sugars TID for E11.65 (Patient not taking: Reported on 03/11/2017) 100 each 12  . Lancet Devices (ACCU-CHEK SOFTCLIX) lancets Check sugars TID for E11.65 (Patient not taking: Reported on 03/11/2017) 1 each 10   Drug Regimen Review  Drug  regimen was reviewed and remains appropriate with no significant issues identified  Home:  Home Living  Family/patient expects to be discharged to:: Private residence  Living Arrangements: Children, Non-relatives/Friends, Other relatives  Available Help at Discharge: (son works days)  Type of Home: House  Home Access: Stairs to enter  Technical brewer of Steps: 3  Entrance Stairs-Rails: Left  [place]: Two level,  Able to live on main level with bedroom/bathroom  Bathroom Shower/Tub: Tourist information centre manager: Standard  Home Equipment: Environmental consultant - 2 wheels, Wheelchair - manual, Bedside commode, Cane - single point, Environmental consultant - 4 wheels, Shower seat, Hand held shower head  Additional Comments: Pt reports a ramp may be being built for her to get inside with w/c  Lives With: Son  Functional History:  Prior Function  Level of Independence: Needs assistance  ADL's / Homemaking Assistance Needed: family does the homemaking; Pt reports family was assisting with ADLs  Functional Status:  Mobility:  Bed Mobility  Overal bed mobility: Needs Assistance  Bed Mobility: Supine to Sit  Supine to sit: Supervision  General bed mobility comments: supervision for safety  Transfers  Overall transfer level: Needs assistance  Equipment used: Rolling walker (2 wheeled)  Transfers: Sit to/from Stand  Sit to Stand: Min guard  General transfer comment: min guard for safety  Ambulation/Gait  Ambulation/Gait assistance: Min guard  Ambulation Distance (Feet): 40 Feet  Assistive device: Rolling walker (2 wheeled)  Gait Pattern/deviations: Trunk flexed (hop-to on R LE)  General Gait Details: mild instability but no overt LOB, mostly limited secondary to increased pain in LLE residual limb, min guard for safety. pt requiring frequent standing rest breaks secondary to bilateral UE fatigue and pain.  Gait velocity: Decreased  Gait velocity interpretation: Below normal speed for age/gender   ADL:    Cognition:  Cognition  Overall Cognitive Status: Within Functional Limits for tasks assessed  Orientation Level: Oriented X4  Cognition  Arousal/Alertness: Awake/alert  Behavior During Therapy: WFL for tasks assessed/performed  Overall Cognitive Status: Within Functional Limits for tasks assessed  General Comments: pt with varied level of assist reported when attempting to obtain PLOF  Blood pressure 129/63, pulse 61, temperature 98.4 F (36.9 C), temperature source Oral, resp. rate 17, height 5' 7" (1.702 m), weight 71.7 kg (158 lb), last menstrual period 03/16/2002, SpO2 93 %.  Physical Exam  Nursing note and vitals reviewed.  Constitutional: She is oriented to person, place, and time. She appears well-developed and well-nourished. No distress.  HENT:  Head: Normocephalic and atraumatic.  Mouth/Throat: Oropharynx is clear and moist.  Eyes: Pupils are equal, round, and reactive to light. Conjunctivae and EOM are normal.  Neck: Normal range of motion. Neck supple.  Cardiovascular: Normal rate and regular rhythm. Exam reveals no friction rub.  Murmur heard.  Respiratory: Effort normal and breath sounds normal. No stridor. No respiratory distress. She has no wheezes.  GI: Soft. Bowel sounds are normal. She exhibits no distension. There is no tenderness.  Musculoskeletal:  AVG LUG with + thrill.  Right foot with hard callus on latera plantar surface.   L-AKA site with moderate edema--,mild serosang drainage. Site remains quite tender to touch. Incision intact and well approximated  Neurological: She is alert and oriented to person, place, and time.  Speech clear. Able to follow commands without difficulty. UE 5/5. RLE: 3/5 prox to 4/5 distally. Able to left leg against gravity. No focal sensory abnl. Cognitively appropriate  Skin: Skin is warm and dry. She is not diaphoretic.  Psychiatric: Her speech is normal and behavior is normal. Her mood appears anxious. Cognition and memory are  normal.   Lab Results Last 48 Hours  Imaging Results (Last 48 hours)     Medical Problem List and Plan:  1. 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 DVT Prophylaxis/Anticoagulation: Pharmaceutical: Lovenox  3. Pain Management: continue oxycodone and robaxin prn. Monitor For SE.  4. Mood: Team to provide ego support. LCSW to follow for evaluation and support.  -continue zoloft for depression  5. Neuropsych: This patient is capable of making decisions on her own behalf.  6. Skin/Wound Care: Monitor wound daily for healing  -dry daily dressing, stump protection  7. Fluids/Electrolytes/Nutrition: Monitor I/O.  -I personally reviewed the patient's labs today.  8. HTN: Monitor BP bid--on Lasix, Hydralazine, Metoprolol and Norvasc.  10 T2DM: Monitor BS ac/hs. Continue lantus with breakfast.  -snack HS  -fair control at present  11. CKD stage V: Continue to monitor renal status every other day--question consult renal.  12. Anemia of chronic disease: Had drop in H/H to 7.2/22.8 requiring another unit of PRBC. -no new signs of bleeding  -hgb 8.7 today  -?aranesp  Post Admission Physician Evaluation:  1. Functional deficits secondary to left above knee amputation. 2. Patient is admitted to receive collaborative, interdisciplinary care between the physiatrist, rehab nursing staff, and therapy team. 3. Patient's level of medical complexity and substantial therapy needs in  context of that medical necessity cannot be provided at a lesser intensity of care such as a SNF. 4. Patient has experienced substantial functional loss from his/her baseline which was documented above under the "Functional History" and "Functional Status" headings. Judging by the patient's diagnosis, physical exam, and functional history, the patient has potential for functional progress which will result in measurable gains while on inpatient rehab. These gains will be of substantial and practical use upon discharge in facilitating mobility and self-care at the household level. 5. Physiatrist will provide 24 hour management of medical needs as well as oversight of the therapy plan/treatment and provide guidance as appropriate regarding the interaction of the two. 6. The Preadmission Screening has been reviewed and patient status is unchanged unless otherwise stated above. 7. 24 hour rehab nursing will assist with bladder management, bowel management, safety, skin/wound care, disease management, medication administration, pain management and patient education and help integrate therapy concepts, techniques,education, etc. 8. PT will assess and treat for/with: Lower extremity strength, range of motion, stamina, balance, functional mobility, safety, adaptive techniques and equipment, pre-prosthetic education, pain control, wound mgt. Goals are: mod I. 9. OT will assess and treat for/with: ADL's, functional mobility, safety, upper extremity strength, adaptive techniques and equipment, pain mgt, pre-prosthetic education, wound care, family ed. Goals are: mod I to min assist. Therapy may proceed with showering this patient if wound is covered. 10. SLP will assess and treat for/with: n/a. Goals are: n/a. 11. Case Management and Social Worker will assess and treat for psychological issues and discharge planning. 12. Team conference will be held weekly to assess progress toward goals and to determine barriers to  discharge. 13. Patient will receive at least 3 hours of therapy per day at least 5 days per week. 14. ELOS: 10-14 days  15. Prognosis: excellent 16.  Meredith Staggers, MD, [facility]  03/27/2017  Bary Leriche, PA-C  03/26/2017

## 2017-03-27 NOTE — Progress Notes (Signed)
Occupational Therapy Session Note  Patient Details  Name: Brandy Houston MRN: [id number] Date of Birth: 1955/11/09  Today's Date: 03/27/2017 OT Individual Time: 7076-1518 OT Individual Time Calculation (min): 72 min    Short Term Goals: Week 1:  OT Short Term Goal 1 (Week 1): STG = LTGs due to ELOS  Skilled Therapeutic Interventions/Progress Updates:  1:1. Pt eats EOB while OT discusses limb loss education book topics of pain managmenet, skin inspection, desensitization, laterality training, and mirror therapy. Pt completes stand pivot transfer EOB<>w/c<>EOB<>BSC throughout session with VC for sequencing, hand placement and safety awareness. OT instructs on desensitization strategies of rubbing, massaging and tapping residual limb and places mirror in between BLE for mirror therapy as pt completes marches/knee extension and heel raises in front of mirror. Pt reporting decrease main 8/10 to 7/10. Pt propels w/c throughout unit/outside over uneven surfaces with supervision. Pt ambulates 15 feetx2 over uneven surfaces with CGA with seated rest break. Pt completes ambulatory shower transfer with MIN A after demonstration of posterior technique by OT and VC for pushing through [place]. Pt toilets with CGA for transfer and clothing management and VC for safety awareness to lock brakes prior to transfer. Exited session with pt seated in bed with call light in reach and all needs met.  Shower trnasfer Therapy Documentation Precautions:  Precautions Precautions: Fall Restrictions Weight Bearing Restrictions: Yes LLE Weight Bearing: Non weight bearing  See Function Navigator for Current Functional Status.   Therapy/Group: Individual Therapy  Tonny Branch 03/27/2017, 2:13 PM

## 2017-03-27 NOTE — Progress Notes (Signed)
Social Work Social Work Assessment and Plan  Patient Details  Name: Brandy Houston MRN: [id number] Date of Birth: 1955/07/18  Today's Date: 03/27/2017  Problem List:  Patient Active Problem List   Diagnosis Date Noted  . Benign essential HTN   . Diabetes mellitus type 2 in obese ([place])   . Stage 5 chronic kidney disease not on chronic dialysis ([place])   . Unilateral AKA, left ([place]) 03/26/2017  . Above knee amputation status, left ([place])   . Acute blood loss anemia   . Chronic diastolic heart failure ([place])   . Post-operative pain   . Leukocytosis   . DM type 2 causing CKD stage 5 ([place]) 03/21/2017  . CKD (chronic kidney disease), stage V ([place]) 03/17/2017  . Wound dehiscence 03/10/2017  . Chronic pain of left knee 03/05/2017  . Fever 02/08/2017  . Infection of prosthetic left knee joint ([place]) 02/07/2017  . AKI (acute kidney injury) ([employer])   . Hemarthrosis   . Anemia of chronic disease 02/05/2017  . Diabetes mellitus, type II ([place]) 02/05/2017  . Dysplasia of cervix, high grade CIN 2 01/12/2017  . ASCUS with positive high risk HPV cervical 12/09/2016  . Chronic diastolic CHF (congestive heart failure) ([place]) 11/08/2015  . Total knee replacement status   . Diabetic peripheral neuropathy ([place])   . Anxiety state   . Depression   . Normocytic anemia 11/07/2015  . Chronic kidney disease (CKD), stage IV (severe) ([place])   . Hepatitis C 08/16/2014  . Peripheral neuropathy 06/24/2013  . 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 Essential hypertension 02/18/2013   Past Medical History:  Past Medical History:  Diagnosis Date  . Anemia   . Anxiety   . Arthritis    "in my joints" (03/10/2017)  . Chronic diastolic CHF (congestive heart failure) ([place])   . CKD (chronic kidney disease), stage IV ([place])    stage IV. previous HD, none currently 10/30/15 (confirmed 02/05/2017 & 03/10/2017)  . Depression    Chronic  . Diabetic peripheral neuropathy ([place])   . Dysrhythmia    tachycardia, normal  ECHO 08-09-14  . GERD (gastroesophageal reflux disease)   . Heart murmur     dx'd 02/05/2017  . Hepatitis C    "tx'd in 2016; I'm negative now" (02/05/2017)  . History of blood transfusion 2017   "w/knee replacement"  . Hypertension   . Osteoarthritis of left knee   . Peripheral neuropathy   . Protein calorie malnutrition ([place])   . Septic arthritis ([place])   . Slow transit constipation   . Type II diabetes mellitus ([name])    IDDM  . Uncontrolled hypertension 02/18/2013  . Unsteady gait    Past Surgical History:  Past Surgical History:  Procedure Laterality Date  . AMPUTATION Left 03/20/2017   Procedure: LEFT ABOVE KNEE AMPUTATION;  Surgeon: Newt Minion, MD;  Location: [facility];  Service: Orthopedics;  Laterality: Left;  . APPENDECTOMY    . AV FISTULA PLACEMENT Left 09/13/2014   Procedure: Brachial Artery to Brachial Vein Gortex Four - Seven Stretch GRAFT INSERTION Left Forearm;  Surgeon: Mal Misty, MD;  Location: [facility];  Service: Vascular;  Laterality: Left;  . CHOLECYSTECTOMY OPEN    . COLON SURGERY    . EXCISIONAL TOTAL KNEE ARTHROPLASTY WITH ANTIBIOTIC SPACERS Left 02/06/2017   Procedure: Incisional total iknee with antibiotic spacer ;  Surgeon: Leandrew Koyanagi, MD;  Location: [facility];  Service: Orthopedics;  Laterality: Left;  . IR FLUORO GUIDE CV  LINE RIGHT  02/10/2017  . IR US GUIDE VASC ACCESS RIGHT  02/10/2017  . IRRIGATION AND DEBRIDEMENT KNEE Left 03/12/2017   Procedure: IRRIGATION AND DEBRIDEMENT LEFT KNEE WITH WOUND VAC APPLICATION;  Surgeon: Leandrew Koyanagi, MD;  Location: [facility];  Service: Orthopedics;  Laterality: Left;  . JOINT REPLACEMENT    . KNEE ARTHROSCOPY Right 08/10/2014   Procedure: ARTHROSCOPY I & D KNEE;  Surgeon: Marianna Payment, MD;  [facility];  Service: Orthopedics;  Laterality: Right;  . KNEE ARTHROSCOPY Left 08/11/2014   Procedure: ARTHROSCOPIC WASHOUT LEFT KNEE;  Surgeon: Marianna Payment, MD;  Location: [facility];  Service: Orthopedics;  Laterality: Left;   . KNEE ARTHROSCOPY Left 08/19/2014   Procedure: ARTHROSCOPIC WASHOUT LEFT KNEE;  Surgeon: Leandrew Koyanagi, MD;  Location: [facility];  Service: Orthopedics;  Laterality: Left;  . KNEE ARTHROSCOPY WITH LATERAL MENISECTOMY Left 04/04/2015   Procedure: AND PARTIAL LATERAL MENISECTOMY;  Surgeon: Leandrew Koyanagi, MD;  Location: [facility];  Service: Orthopedics;  Laterality: Left;  . KNEE ARTHROSCOPY WITH MEDIAL MENISECTOMY Left 04/04/2015   Procedure: LEFT KNEE ARTHROSCOPY WITH PARTIAL MEDIAL MENISCECTOMY  AND SYNOVECTOMY;  Surgeon: Leandrew Koyanagi, MD;  Location: [facility];  Service: Orthopedics;  Laterality: Left;  . SHOULDER ARTHROSCOPY Bilateral 08/10/2014   Procedure: I & D BILATERAL SHOULDERS ;  Surgeon: Marianna Payment, MD;  [facility];  Service: Orthopedics;  Laterality: Bilateral;  . SMALL INTESTINE SURGERY     Due to Small Bowel Obstruction; "fixed it when they did my gallbladder OR"  . TEE WITHOUT CARDIOVERSION N/A 08/14/2014   Procedure: TRANSESOPHAGEAL ECHOCARDIOGRAM (TEE);  Surgeon: Thayer Headings, MD;  Location: [facility];  Service: Cardiovascular;  Laterality: N/A;  . TENOSYNOVECTOMY Right 08/11/2014   Procedure: RIGHT WRIST IRRIGATION AND DEBRIDEMENT, TENOSYNOVECTOMY;  Surgeon: Marianna Payment, MD;  Location: [facility];  Service: Orthopedics;  Laterality: Right;  . TOTAL KNEE ARTHROPLASTY Left 11/07/2015   Procedure: LEFT TOTAL KNEE ARTHROPLASTY WITH REVISION OF IMPLANTS;  Surgeon: Leandrew Koyanagi, MD;  Location: [facility] [facility];  Service: Orthopedics;  Laterality: Left;  . TUBAL LIGATION     Social History:  reports that she has been smoking Cigarettes.  She has a 17.50 pack-year smoking history. She has never used smokeless tobacco. She reports that she drinks alcohol. She reports that she does not use drugs.  Family / Support Systems Marital Status: Single Patient Roles: Parent, Other (Comment) (grandparent) Children: son, Charlane Westry (lives with pt) @ (C) [phone];   daughter, Aslee Such [facility]) @ (C) [phone] and a son in [place], [place] Anticipated Caregiver: son and his girlfriend Ability/Limitations of Caregiver: son works and pt will be alone for a couple of hours each day until his girlfriend returns. Caregiver Availability: Intermittent Family Dynamics: Pt describes good relationship with his son and his girlfriend.  Denies any concerna about assistance she will have at home.  Social History Preferred language: English Religion: Baptist Cultural Background: NA Education: HS Read: Yes Write: Yes Employment Status: Disabled Date Retired/Disabled/Unemployed: 2016 Freight forwarder Issues: None Guardian/Conservator: None - per MD, pt is capable of making decisions on her own behalf.   Abuse/Neglect Physical Abuse: Denies Verbal Abuse: Denies Sexual Abuse: Denies Exploitation of patient/patient's resources: Denies Self-Neglect: Denies  Emotional Status Pt's affect, behavior adn adjustment status: Pt pleasant, talkative and completes assessment interview without any difficulty.  She denies any s/s of emotional distress.  Reports that she "knew for awhile" that  she would likely require an amputation.  Will monitor and refer for neuropsychology support as indicated. Recent Psychosocial Issues: Pt describes lengthy struggle by MDs to salvage her leg and states, "It was exhausing." Pyschiatric History: None Substance Abuse History: None  Patient / Family Perceptions, Expectations & Goals Pt/Family understanding of illness & functional limitations: Pt and family with good understanding of the medical steps taken to try and salvage her leg and the reason behind decision for amputation.  Good awareness of her new functional limitations/ need for CIR. Premorbid pt/family roles/activities: Pt was mod independent PTA Anticipated changes in roles/activities/participation: little change anticipated if able to reach mod ind  goals. Pt/family expectations/goals: "I just want to get home as soon as I'm ready."  [employer]: Other (Comment) ([facility] for transport) Premorbid Home Care/DME Agencies: Other (Comment) ([facility]) Transportation available at discharge: yes Resource referrals recommended: Support group (specify) (Amputee Support Group)  Discharge Planning Living Arrangements: Children, Non-relatives/Friends Support Systems: Children, Other relatives, Friends/neighbors Type of Residence: Private residence Insurance Resources: [employer], [employer] (specify county) Pensions consultant: SSD, SSI Financial Screen Referred: No Living Expenses: Lives with family Money Management: Family Does the patient have any problems obtaining your medications?: No Home Management: pt and family Patient/Family Preliminary Plans: Pt to return home with son and his family Social Work Anticipated Follow Up Needs: HH/OP Expected length of stay: 5-7 days  Clinical Impression Very pleasant woman here following AKA and very motivated for therapies and to return home as quickly as possible.  She denies any significant emotional distress but will monitor during CIR stay.  Family able to provide very close to 24/7 support.  Will follow for d/c planning needs.  Graeden Bitner 03/27/2017, 2:36 PM

## 2017-03-27 NOTE — Evaluation (Signed)
Occupational Therapy Assessment and Plan  Patient Details  Name: Brandy Houston MRN: [id number] Date of Birth: [birth year]  OT Diagnosis: acute pain and muscle weakness (generalized) Rehab Potential: Rehab Potential (ACUTE ONLY): Good ELOS: 5-7 days   Today's Date: 03/27/2017 OT Individual Time: 1036-1130 OT Individual Time Calculation (min): 54 min     Problem List:  Patient Active Problem List   Diagnosis Date Noted  . Unilateral AKA, left ([place]) 03/26/2017  . Above knee amputation status, left ([facility])   . Acute blood loss anemia   . Chronic diastolic heart failure ([place])   . Post-operative pain   . Leukocytosis   . DM type 2 causing CKD stage 5 ([place]) 03/21/2017  . CKD (chronic kidney disease), stage V ([place]) 03/17/2017  . Wound dehiscence 03/10/2017  . Chronic pain of left knee 03/05/2017  . Fever 02/08/2017  . Infection of prosthetic left knee joint ([place]) 02/07/2017  . AKI (acute kidney injury) ([place])   . Hemarthrosis   . Anemia of chronic disease 02/05/2017  . Diabetes mellitus, type II ([place]) 02/05/2017  . Dysplasia of cervix, high grade CIN 2 01/12/2017  . ASCUS with positive high risk HPV cervical 12/09/2016  . Chronic diastolic CHF (congestive heart failure) ([place]) 11/08/2015  . Total knee replacement status   . Diabetic peripheral neuropathy ([place])   . Anxiety state   . Depression   . Normocytic anemia 11/07/2015  . Chronic kidney disease (CKD), stage IV (severe) ([place])   . Hepatitis C 08/16/2014  . Peripheral neuropathy 06/24/2013  . 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 Essential hypertension 02/18/2013    Past Medical History:  Past Medical History:  Diagnosis Date  . Anemia   . Anxiety   . Arthritis    "in my joints" (03/10/2017)  . Chronic diastolic CHF (congestive heart failure) ([place])   . CKD (chronic kidney disease), stage IV ([place])    stage IV. previous HD, none currently 10/30/15 (confirmed 02/05/2017 & 03/10/2017)  . Depression    Chronic  .  Diabetic peripheral neuropathy ([place])   . Dysrhythmia    tachycardia, normal ECHO 08-09-14  . GERD (gastroesophageal reflux disease)   . Heart murmur     dx'd 02/05/2017  . Hepatitis C    "tx'd in 2016; I'm negative now" (02/05/2017)  . History of blood transfusion 2017   "w/knee replacement"  . Hypertension   . Osteoarthritis of left knee   . Peripheral neuropathy   . Protein calorie malnutrition ([place])   . Septic arthritis ([place])   . Slow transit constipation   . Type II diabetes mellitus ([name])    IDDM  . Uncontrolled hypertension 02/18/2013  . Unsteady gait    Past Surgical History:  Past Surgical History:  Procedure Laterality Date  . AMPUTATION Left 03/20/2017   Procedure: LEFT ABOVE KNEE AMPUTATION;  Surgeon: Newt Minion, MD;  Location: [facility];  Service: Orthopedics;  Laterality: Left;  . APPENDECTOMY    . AV FISTULA PLACEMENT Left 09/13/2014   Procedure: Brachial Artery to Brachial Vein Gortex Four - Seven Stretch GRAFT INSERTION Left Forearm;  Surgeon: Mal Misty, MD;  Location: [facility];  Service: Vascular;  Laterality: Left;  . CHOLECYSTECTOMY OPEN    . COLON SURGERY    . EXCISIONAL TOTAL KNEE ARTHROPLASTY WITH ANTIBIOTIC SPACERS Left 02/06/2017   Procedure: Incisional total iknee with antibiotic spacer ;  Surgeon: Leandrew Koyanagi, MD;  Location: [facility];  Service: Orthopedics;  Laterality: Left;  .  IR FLUORO GUIDE CV LINE RIGHT  02/10/2017  . IR US GUIDE VASC ACCESS RIGHT  02/10/2017  . IRRIGATION AND DEBRIDEMENT KNEE Left 03/12/2017   Procedure: IRRIGATION AND DEBRIDEMENT LEFT KNEE WITH WOUND VAC APPLICATION;  Surgeon: Leandrew Koyanagi, MD;  Location: [facility];  Service: Orthopedics;  Laterality: Left;  . JOINT REPLACEMENT    . KNEE ARTHROSCOPY Right 08/10/2014   Procedure: ARTHROSCOPY I & D KNEE;  Surgeon: Marianna Payment, MD;  [facility];  Service: Orthopedics;  Laterality: Right;  . KNEE ARTHROSCOPY Left 08/11/2014   Procedure: ARTHROSCOPIC WASHOUT LEFT KNEE;  Surgeon:  Marianna Payment, MD;  Location: [facility];  Service: Orthopedics;  Laterality: Left;  . KNEE ARTHROSCOPY Left 08/19/2014   Procedure: ARTHROSCOPIC WASHOUT LEFT KNEE;  Surgeon: Leandrew Koyanagi, MD;  Location: [facility];  Service: Orthopedics;  Laterality: Left;  . KNEE ARTHROSCOPY WITH LATERAL MENISECTOMY Left 04/04/2015   Procedure: AND PARTIAL LATERAL MENISECTOMY;  Surgeon: Leandrew Koyanagi, MD;  Location: [facility];  Service: Orthopedics;  Laterality: Left;  . KNEE ARTHROSCOPY WITH MEDIAL MENISECTOMY Left 04/04/2015   Procedure: LEFT KNEE ARTHROSCOPY WITH PARTIAL MEDIAL MENISCECTOMY  AND SYNOVECTOMY;  Surgeon: Leandrew Koyanagi, MD;  Location: [facility];  Service: Orthopedics;  Laterality: Left;  . SHOULDER ARTHROSCOPY Bilateral 08/10/2014   Procedure: I & D BILATERAL SHOULDERS ;  Surgeon: Marianna Payment, MD;  [facility];  Service: Orthopedics;  Laterality: Bilateral;  . SMALL INTESTINE SURGERY     Due to Small Bowel Obstruction; "fixed it when they did my gallbladder OR"  . TEE WITHOUT CARDIOVERSION N/A 08/14/2014   Procedure: TRANSESOPHAGEAL ECHOCARDIOGRAM (TEE);  Surgeon: Thayer Headings, MD;  Location: [facility];  Service: Cardiovascular;  Laterality: N/A;  . TENOSYNOVECTOMY Right 08/11/2014   Procedure: RIGHT WRIST IRRIGATION AND DEBRIDEMENT, TENOSYNOVECTOMY;  Surgeon: Marianna Payment, MD;  Location: [facility];  Service: Orthopedics;  Laterality: Right;  . TOTAL KNEE ARTHROPLASTY Left 11/07/2015   Procedure: LEFT TOTAL KNEE ARTHROPLASTY WITH REVISION OF IMPLANTS;  Surgeon: Leandrew Koyanagi, MD;  Location: [facility];  Service: Orthopedics;  Laterality: Left;  . TUBAL LIGATION      Assessment & Plan Clinical Impression: Patient is a 61 y.o. year old female with history of diastolic CHF, U9WJ, CKD- V, septic arthritis left knee s/p I and D with antibiotic therapy but continued to poor wound healing who was admitted on 03/10/17 from [facility] with dehiscence of wound. History  taken from chart review. She failed attempts at limb salvage and required L-AKA on 10/5 by Dr. Sharol Given. Post op has had issues with dizziness as well as acute on chronic anemia requiring multiple transfusions. Therapy ongoing with patient making good progress. She has had significant decline in mobility and ability to carryout ALDs. CIR recommended by MD for follow up therapy.   Patient transferred to CIR on 03/26/2017 .    Patient currently requires min with basic self-care skills secondary to muscle weakness, decreased cardiorespiratoy endurance and decreased standing balance and decreased balance strategies.  Prior to hospitalization, patient could complete ADLs with independent .  Patient will benefit from skilled intervention to increase independence with basic self-care skills prior to discharge home with care partner.  Anticipate patient will require intermittent supervision and follow up home health.  OT - End of Session Activity Tolerance: Tolerates 30+ min activity with multiple rests Endurance Deficit: Yes Endurance Deficit Description: required frequent rest breaks due to pain OT  Assessment Rehab Potential (ACUTE ONLY): Good OT Barriers to Discharge: Decreased caregiver support OT Patient demonstrates impairments in the following area(s): Balance;Edema;Endurance;Pain;Perception;Safety;Sensory;Skin Integrity OT Basic ADL's Functional Problem(s): Grooming;Bathing;Dressing;Toileting OT Advanced ADL's Functional Problem(s): Simple Meal Preparation;Light Housekeeping OT Transfers Functional Problem(s): Toilet;Tub/Shower OT Additional Impairment(s): None OT Plan OT Intensity: Minimum of 1-2 x/day, 45 to 90 minutes OT Frequency: 5 out of 7 days OT Duration/Estimated Length of Stay: 5-7 days OT Treatment/Interventions: Balance/vestibular training;Discharge planning;Disease Lawyer;Functional mobility training;Pain management;Patient/family  education;Psychosocial support;Self Care/advanced ADL retraining;Skin care/wound managment;Splinting/orthotics;Therapeutic Activities;Therapeutic Exercise;UE/LE Strength taining/ROM;Wheelchair propulsion/positioning OT Basic Self-Care Anticipated Outcome(s): Supervision-Mod I OT Toileting Anticipated Outcome(s): Mod I OT Bathroom Transfers Anticipated Outcome(s): Supervision - Mod I OT Recommendation Recommendations for Other Services: Neuropsych consult Patient destination: Home Follow Up Recommendations: Home health OT Equipment Recommended: To be determined   Skilled Therapeutic Intervention OT eval completed with discussion of rehab process, OT purpose, POC, ELOS, and goals.  ADL assessment with toilet transfer and bathing/dressing at sit > stand level at sink.  Min assist for stand pivot transfer and standing balance during self-care tasks.  Educated on residual limb care with wrapping for shaping and edema management and desensitization strategies.  Left supine in bed due to fatigue and pain in RLE.  OT Evaluation Precautions/Restrictions  Precautions Precautions: Fall Restrictions Weight Bearing Restrictions: Yes LLE Weight Bearing: Non weight bearing Pain Pain Assessment Pain Assessment: 0-10 Pain Score: 9  Faces Pain Scale: Hurts a little bit Pain Type: Surgical pain (sensitivity) Pain Location: Leg Pain Orientation: Left Pain Descriptors / Indicators: Burning;Tender Pain Onset: On-going Pain Intervention(s): RN made aware Home Living/Prior Functioning Home Living Family/patient expects to be discharged to:: Private residence Living Arrangements: Children Available Help at Discharge: Family, Available PRN/intermittently (son works during the day) Type of Home: House Home Access: Stairs to enter Technical brewer of Steps: 3 Entrance Stairs-Rails: Left Home Layout: Two level, Able to live on main level with bedroom/bathroom Bathroom Shower/Tub: Gaffer,  Architectural technologist: Standard Additional Comments: uses BSC in shower, has grab bars, and hand held shower  Lives With: Son IADL History Homemaking Responsibilities: Yes Meal Prep Responsibility: Therapist, occupational Responsibility: No Cleaning Responsibility: Primary Prior Function Level of Independence: Independent with basic ADLs, Requires assistive device for independence  Able to Take Stairs?: No Driving: No Comments: pt states her son would help her negotiate stairs in w/c, states her son drives her where she needs to go ADL  See Function Navigator Vision Baseline Vision/History: Wears glasses Wears Glasses: Reading only Patient Visual Report:  (reports occasional glare) Vision Assessment?: No apparent visual deficits Cognition Overall Cognitive Status: Within Functional Limits for tasks assessed Arousal/Alertness: Awake/alert Orientation Level: Person;Place;Situation Person: Oriented Place: Oriented Situation: Oriented Year: 2018 Month: October Day of Week: Correct Immediate Memory Recall: Sock;Blue;Bed Memory Recall: Bed;Sock;Blue Memory Recall Sock: Without Cue Memory Recall Blue: With Cue Memory Recall Bed: Without Cue Attention: Alternating Awareness: Appears intact Problem Solving: Appears intact Safety/Judgment: Appears intact Sensation Sensation Light Touch: Impaired Detail Light Touch Impaired Details: Impaired LLE (hypersensitive, phantom pain/sensation) Hot/Cold: Appears Intact Proprioception: Appears Intact Coordination Gross Motor Movements are Fluid and Coordinated: Yes Fine Motor Movements are Fluid and Coordinated: Yes Motor  Motor Motor - Skilled Clinical Observations: generalized weakness  Trunk/Postural Assessment  Cervical Assessment Cervical Assessment: Within Functional Limits Thoracic Assessment Thoracic Assessment: Within Functional Limits Lumbar Assessment Lumbar Assessment:  (posterior pelvic tilt) Postural Control Postural  Control: Deficits on evaluation Righting Reactions: delayed  Balance Dynamic Sitting Balance Sitting balance -  Comments: mod I to gather items off the floor from w/c level Static Standing Balance Static Standing - Comment/# of Minutes: min A Dynamic Standing Balance Dynamic Standing - Comments: min A Extremity/Trunk Assessment RUE Assessment RUE Assessment: Within Functional Limits (generalized weakness) LUE Assessment LUE Assessment: Within Functional Limits (generalized weakness)   See Function Navigator for Current Functional Status.   Refer to Care Plan for Long Term Goals  Recommendations for other services: Neuropsych   Discharge Criteria: Patient will be discharged from OT if patient refuses treatment 3 consecutive times without medical reason, if treatment goals not met, if there is a change in medical status, if patient makes no progress towards goals or if patient is discharged from hospital.  The above assessment, treatment plan, treatment alternatives and goals were discussed and mutually agreed upon: by patient  Simonne Come 03/27/2017, 11:43 AM

## 2017-03-27 NOTE — Plan of Care (Signed)
Problem: RH BOWEL ELIMINATION Goal: RH STG MANAGE BOWEL WITH ASSISTANCE STG Manage Bowel with mod I Assistance.  Outcome: Not Progressing Pt constipated. BM this AM small hard pellets. Miralax ordered 2x daily. First dose given. Goal: RH STG MANAGE BOWEL W/MEDICATION W/ASSISTANCE STG Manage Bowel with Medication with mod I  Assistance.  Outcome: Not Progressing Pt constipated. BM this AM small hard pellets. Miralax ordered 2x daily. First dose given.

## 2017-03-27 NOTE — Progress Notes (Signed)
Pt arrived on unit from 5N18 accompanied by Nira Conn, RN and NT. Pt late arrival due to pt procedure prior to discharge and unavailable bed on unit as a result of a late discharge and uncleaned room. This RN maintained contact with discharging RN Nira Conn on status of room. Pt oriented to unit, call bell system, fall safety plan, therapy schedule and Health Resource notebook. Admission will be completed by oncoming shift.

## 2017-03-27 NOTE — Consult Note (Signed)
WOC consult requested for AKA edema and wound.  Pt is post-surgery and is followed by Dr Sharol Given of the ortho service; he has provided topical treatment orders for the bedside nurses to perform and has ordered a stump shrinker, which expected to be delivered on 10/12, according to the EMR.  If there are further questions, please refer to Dr Sharol Given.   Please re-consult if further assistance is needed.  Gae Dry MSN, RN, [place], [place], [place].

## 2017-03-27 NOTE — Evaluation (Signed)
Physical Therapy Assessment and Plan  Patient Details  Name: SHYRA EMILE MRN: [id number] Date of Birth: [birth year]  PT Diagnosis: Difficulty walking, Impaired sensation and Muscle weakness Rehab Potential: Good ELOS: 5-7 days   Today's Date: 03/27/2017 PT Individual Time: 7209-4709 PT Individual Time Calculation (min): 68 min    Problem List:  Patient Active Problem List   Diagnosis Date Noted  . Unilateral AKA, left ([place]) 03/26/2017  . Above knee amputation status, left ([place])   . Acute blood loss anemia   . Chronic diastolic heart failure ([place])   . Post-operative pain   . Leukocytosis   . 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 CKD (chronic kidney disease), stage V ([place]) 03/17/2017  . Wound dehiscence 03/10/2017  . Chronic pain of left knee 03/05/2017  . Fever 02/08/2017  . Infection of prosthetic left knee joint ([place]) 02/07/2017  . AKI (acute kidney injury) ([place])   . Hemarthrosis   . Anemia of chronic disease 02/05/2017  . Diabetes mellitus, type II ([place]) 02/05/2017  . Dysplasia of cervix, high grade CIN 2 01/12/2017  . ASCUS with positive high risk HPV cervical 12/09/2016  . Chronic diastolic CHF (congestive heart failure) ([place]) 11/08/2015  . Total knee replacement status   . Diabetic peripheral neuropathy ([place])   . Anxiety state   . Depression   . Normocytic anemia 11/07/2015  . Chronic kidney disease (CKD), stage IV (severe) ([place])   . Hepatitis C 08/16/2014  . Peripheral neuropathy 06/24/2013  . 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 Essential hypertension 02/18/2013    Past Medical History:  Past Medical History:  Diagnosis Date  . Anemia   . Anxiety   . Arthritis    "in my joints" (03/10/2017)  . Chronic diastolic CHF (congestive heart failure) ([place])   . CKD (chronic kidney disease), stage IV ([place])    stage IV. previous HD, none currently 10/30/15 (confirmed 02/05/2017 & 03/10/2017)  . Depression    Chronic  . Diabetic peripheral  neuropathy ([place])   . Dysrhythmia    tachycardia, normal ECHO 08-09-14  . GERD (gastroesophageal reflux disease)   . Heart murmur     dx'd 02/05/2017  . Hepatitis C    "tx'd in 2016; I'm negative now" (02/05/2017)  . History of blood transfusion 2017   "w/knee replacement"  . Hypertension   . Osteoarthritis of left knee   . Peripheral neuropathy   . Protein calorie malnutrition ([place])   . Septic arthritis ([place])   . Slow transit constipation   . Type II diabetes mellitus ([name])    IDDM  . Uncontrolled hypertension 02/18/2013  . Unsteady gait    Past Surgical History:  Past Surgical History:  Procedure Laterality Date  . AMPUTATION Left 03/20/2017   Procedure: LEFT ABOVE KNEE AMPUTATION;  Surgeon: Newt Minion, MD;  Location: [facility];  Service: Orthopedics;  Laterality: Left;  . APPENDECTOMY    . AV FISTULA PLACEMENT Left 09/13/2014   Procedure: Brachial Artery to Brachial Vein Gortex Four - Seven Stretch GRAFT INSERTION Left Forearm;  Surgeon: Mal Misty, MD;  Location: [facility];  Service: Vascular;  Laterality: Left;  . CHOLECYSTECTOMY OPEN    . COLON SURGERY    . EXCISIONAL TOTAL KNEE ARTHROPLASTY WITH ANTIBIOTIC SPACERS Left 02/06/2017   Procedure: Incisional total iknee with antibiotic spacer ;  Surgeon: Leandrew Koyanagi, MD;  Location: [facility];  Service: Orthopedics;  Laterality: Left;  . IR FLUORO GUIDE CV  LINE RIGHT  02/10/2017  . IR US GUIDE VASC ACCESS RIGHT  02/10/2017  . IRRIGATION AND DEBRIDEMENT KNEE Left 03/12/2017   Procedure: IRRIGATION AND DEBRIDEMENT LEFT KNEE WITH WOUND VAC APPLICATION;  Surgeon: Leandrew Koyanagi, MD;  Location: [facility];  Service: Orthopedics;  Laterality: Left;  . JOINT REPLACEMENT    . KNEE ARTHROSCOPY Right 08/10/2014   Procedure: ARTHROSCOPY I & D KNEE;  Surgeon: Marianna Payment, MD;  Location: [facility];  Service: Orthopedics;  Laterality: Right;  . KNEE ARTHROSCOPY Left 08/11/2014   Procedure: ARTHROSCOPIC WASHOUT LEFT KNEE;  Surgeon: Marianna Payment, MD;   Location: [facility];  Service: Orthopedics;  Laterality: Left;  . KNEE ARTHROSCOPY Left 08/19/2014   Procedure: ARTHROSCOPIC WASHOUT LEFT KNEE;  Surgeon: Leandrew Koyanagi, MD;  Location: [facility];  Service: Orthopedics;  Laterality: Left;  . KNEE ARTHROSCOPY WITH LATERAL MENISECTOMY Left 04/04/2015   Procedure: AND PARTIAL LATERAL MENISECTOMY;  Surgeon: Leandrew Koyanagi, MD;  Location: [facility];  Service: Orthopedics;  Laterality: Left;  . KNEE ARTHROSCOPY WITH MEDIAL MENISECTOMY Left 04/04/2015   Procedure: LEFT KNEE ARTHROSCOPY WITH PARTIAL MEDIAL MENISCECTOMY  AND SYNOVECTOMY;  Surgeon: Leandrew Koyanagi, MD;  Location: [facility];  Service: Orthopedics;  Laterality: Left;  . SHOULDER ARTHROSCOPY Bilateral 08/10/2014   Procedure: I & D BILATERAL SHOULDERS ;  Surgeon: Marianna Payment, MD;  Location: [facility];  Service: Orthopedics;  Laterality: Bilateral;  . SMALL INTESTINE SURGERY     Due to Small Bowel Obstruction; "fixed it when they did my gallbladder OR"  . TEE WITHOUT CARDIOVERSION N/A 08/14/2014   Procedure: TRANSESOPHAGEAL ECHOCARDIOGRAM (TEE);  Surgeon: Thayer Headings, MD;  Location: [facility];  Service: Cardiovascular;  Laterality: N/A;  . TENOSYNOVECTOMY Right 08/11/2014   Procedure: RIGHT WRIST IRRIGATION AND DEBRIDEMENT, TENOSYNOVECTOMY;  Surgeon: Marianna Payment, MD;  Location: [facility];  Service: Orthopedics;  Laterality: Right;  . TOTAL KNEE ARTHROPLASTY Left 11/07/2015   Procedure: LEFT TOTAL KNEE ARTHROPLASTY WITH REVISION OF IMPLANTS;  Surgeon: Leandrew Koyanagi, MD;  Location: [facility];  Service: Orthopedics;  Laterality: Left;  . TUBAL LIGATION      Assessment & Plan Clinical Impression: Patient is a 61 y.o. year old female with recent admission to the hospital on 03/10/17 from [facility] with dehiscence of wound. History taken from chart review. She failed attempts at limb salvage and required L-AKA on 10/5 by Dr. Sharol Given. Post op has had issues with dizziness as well as  acute on chronic anemia requiring multiple transfusions.  Patient transferred to CIR on 03/26/2017 .   Patient currently requires min with mobility secondary to muscle weakness and decreased standing balance and decreased balance strategies.  Prior to hospitalization, patient was modified independent  with mobility and lived with Son in a House home.  Home access is 3Stairs to enter.  Patient will benefit from skilled PT intervention to maximize safe functional mobility, minimize fall risk and decrease caregiver burden for planned discharge home with intermittent assist.  Anticipate patient will benefit from follow up [facility] at discharge.  PT - End of Session Activity Tolerance: Tolerates 30+ min activity with multiple rests Endurance Deficit: Yes PT Assessment Rehab Potential (ACUTE/IP ONLY): Good PT Barriers to Discharge: Inaccessible home environment;Decreased caregiver support PT Barriers to Discharge Comments: stairs to enter home, son works PT Patient demonstrates impairments in the following area(s): Edema;Endurance;Motor;Sensory;Balance;Pain PT Transfers Functional Problem(s): Bed Mobility;Bed to Chair;Car;Furniture PT Locomotion Functional Problem(s): Stairs;Wheelchair Mobility;Ambulation PT  Plan PT Intensity: Minimum of 1-2 x/day ,45 to 90 minutes PT Frequency: 5 out of 7 days PT Duration Estimated Length of Stay: 5-7 days PT Treatment/Interventions: Ambulation/gait training;Discharge planning;DME/adaptive equipment instruction;Functional mobility training;Pain management;Splinting/orthotics;Therapeutic Activities;UE/LE Strength taining/ROM;Balance/vestibular training;Community reintegration;Neuromuscular re-education;Patient/family education;Stair training;Therapeutic Exercise;UE/LE Coordination activities;Wheelchair propulsion/positioning PT Transfers Anticipated Outcome(s): mod I PT Locomotion Anticipated Outcome(s): mod I w/c, supervision gait PT Recommendation Follow Up  Recommendations: Home health PT Patient destination: Home Equipment Recommended: To be determined  Skilled Therapeutic Intervention Pt participated in skilled PT eval and was educated on PT goals and POC. Pt performed stand and squat pivot transfers during session with min A.  Gait with RW 2 x 40' with min A, standing rest breaks due to fatigue.  Simulated car transfer with min A, cues for safety with squat pivot transfer to car.  Pt performs manual w/c mobility throughout unit with supervision, cues for safety and set up for transfers.  Pt given HEP for LE exercises to perform at next therapy session. Pt educated on importance of desensitization to reduce pain in residual limb.  PT Evaluation Precautions/Restrictions Precautions Precautions: Fall Restrictions LLE Weight Bearing: Non weight bearing Pain Pain Assessment Faces Pain Scale: Hurts a little bit Pain Type: Surgical pain Pain Location: Leg Pain Orientation: Left Pain Descriptors / Indicators: Burning;Shooting Pain Onset: On-going Pain Intervention(s): RN made aware;Repositioned;Rest Home Living/Prior Functioning Home Living Available Help at Discharge: Family;Available PRN/intermittently Type of Home: House Home Access: Stairs to enter [employer] of Steps: 3 Entrance Stairs-Rails: Left Home Layout: Two level;Able to live on main level with bedroom/bathroom  Lives With: Son Prior Function Level of Independence: Requires assistive device for independence  Able to Take Stairs?: No Driving: No Comments: pt states her son would help her negotiate stairs in w/c, states her son drives her where she needs to go  Cognition Overall Cognitive Status: Within Functional Limits for tasks assessed Arousal/Alertness: Awake/alert Sensation Sensation Light Touch: Impaired Detail Light Touch Impaired Details: Impaired LLE (hypersensitive, phantom sensation) Proprioception: Appears Intact Coordination Gross Motor  Movements are Fluid and Coordinated: Yes Motor  Motor Motor - Skilled Clinical Observations: generalized weakness   Trunk/Postural Assessment  Cervical Assessment Cervical Assessment: Within Functional Limits Thoracic Assessment Thoracic Assessment: Within Functional Limits Lumbar Assessment Lumbar Assessment:  (posterior pelvic tilt) Postural Control Postural Control: Deficits on evaluation Righting Reactions: delayed  Balance Dynamic Sitting Balance Sitting balance - Comments: mod I to gather items off the floor from w/c level Static Standing Balance Static Standing - Comment/# of Minutes: min A Dynamic Standing Balance Dynamic Standing - Comments: min A Extremity Assessment      RLE Assessment RLE Assessment:  (grossly 3+/5) LLE Assessment LLE Assessment:  (hip strength limited by pain)   See Function Navigator for Current Functional Status.   Refer to Care Plan for Long Term Goals  Recommendations for other services: None   Discharge Criteria: Patient will be discharged from PT if patient refuses treatment 3 consecutive times without medical reason, if treatment goals not met, if there is a change in medical status, if patient makes no progress towards goals or if patient is discharged from hospital.  The above assessment, treatment plan, treatment alternatives and goals were discussed and mutually agreed upon: by patient  Dianah Pruett 03/27/2017, 9:22 AM

## 2017-03-28 ENCOUNTER — Inpatient Hospital Stay (HOSPITAL_COMMUNITY): Payer: Medicare Other | Admitting: Physical Therapy

## 2017-03-28 ENCOUNTER — Inpatient Hospital Stay (HOSPITAL_COMMUNITY): Payer: Medicare Other

## 2017-03-28 DIAGNOSIS — R109 Unspecified abdominal pain: Secondary | ICD-10-CM

## 2017-03-28 DIAGNOSIS — Z89612 Acquired absence of left leg above knee: Secondary | ICD-10-CM

## 2017-03-28 LAB — OCCULT BLOOD X 1 CARD TO LAB, STOOL: Fecal Occult Bld: NEGATIVE

## 2017-03-28 LAB — GLUCOSE, CAPILLARY
Glucose-Capillary: 108 mg/dL — ABNORMAL HIGH (ref 65–99)
Glucose-Capillary: 79 mg/dL (ref 65–99)
Glucose-Capillary: 69 mg/dL (ref 65–99)
Glucose-Capillary: 105 mg/dL — ABNORMAL HIGH (ref 65–99)

## 2017-03-28 NOTE — Progress Notes (Signed)
Occupational Therapy Session Note  Patient Details  Name: [name] MRN: [id number] Date of Birth: 12-02-1955  Today's Date: 03/28/2017 OT Individual Time: [phone] OT Individual Time Calculation (min): 71 min    Short Term Goals: Week 1:  OT Short Term Goal 1 (Week 1): STG = LTGs due to ELOS  Skilled Therapeutic Interventions/Progress Updates:    1:1. Pt eats EOB for sitting balance and discusses pain management strategies with OT. Pt stand pivot trnasfer throughout session with CGA and VC for hand placment/locking brakes EOB<>w/c<>EOM. Pt completes bathing and dressing from sit to stand level wit CGA for advancing pants past hips. Pt able to retrieve clothing items from w/c level with VC for w/c placement when reaching into dresser. Pt brushes teeth with set up seated in w/c. Pt practices posterior method shower trnasfer with MIN A and VC for pushing through arms to hop over ledge. OT adjusts walker for proper bend in elbow. Pt stands to match cards on vertical board reaching in all directions with B arms to improve standing balance/endurance with unilateral arm support in prep for clothing manamgent. Exited session with pt seated in bed with call light in reach and all needs met  Therapy Documentation Precautions:  Precautions Precautions: Fall Restrictions Weight Bearing Restrictions: Yes LLE Weight Bearing: Non weight bearing  See Function Navigator for Current Functional Status.   Therapy/Group: Individual Therapy  Tonny [place] 03/28/2017, 9:11 AM

## 2017-03-28 NOTE — Progress Notes (Signed)
Pt resting in bed quietly. Easily aroused. C/o pain in the left stump and had self removed the dressing because it "was too tight around the waist." L AKA was assessed and cleansed with wound cleanser noting that there is a discolored blister forming superior to the incision line that has # 15 sutures within linearly. Dry drsg is applied with ace wrap ontop and pulled tightly to assist in pain and edema management. Pt able to tolerate wrap and was happy that it "remained in place" while she slept. Able to make needs known.  Nicotine patch removed from right deltoid and discarded this am. Safety maintained. callbell within reach. Will continue to monitor.

## 2017-03-28 NOTE — Progress Notes (Signed)
Subjective/Complaints: Patient asking about swelling in the left flank area. No pains in that area. She has some swelling in the left stump as well. Has compressive dressing.   Review of systems denies chest pain, shortness breath, nausea, vomiting, diarrhea or constipation  Objective: Vital Signs: Blood pressure (!) 141/61, pulse 65, temperature 98.8 F (37.1 C), temperature source Oral, resp. rate 18, height '5\' 7"'  (1.702 m), weight 73 kg (160 lb 14.4 oz), last menstrual period 03/16/2002, SpO2 90 %. No results found. Results for orders placed or performed during the hospital encounter of 03/26/17 (from the past 72 hour(s))  Glucose, capillary     Status: Abnormal   Collection Time: 03/26/17  9:02 PM  Result Value Ref Range   Glucose-Capillary 174 (H) 65 - 99 mg/dL  CBC WITH DIFFERENTIAL     Status: Abnormal   Collection Time: 03/27/17  4:13 AM  Result Value Ref Range   WBC 9.5 4.0 - 10.5 K/uL   RBC 3.04 (L) 3.87 - 5.11 MIL/uL   Hemoglobin 8.7 (L) 12.0 - 15.0 g/dL   HCT 26.6 (L) 36.0 - 46.0 %   MCV 87.5 78.0 - 100.0 fL   MCH 28.6 26.0 - 34.0 pg   MCHC 32.7 30.0 - 36.0 g/dL   RDW 15.9 (H) 11.5 - 15.5 %   Platelets 278 150 - 400 K/uL   Neutrophils Relative % 76 %   Neutro Abs 7.2 1.7 - 7.7 K/uL   Lymphocytes Relative 12 %   Lymphs Abs 1.1 0.7 - 4.0 K/uL   Monocytes Relative 10 %   Monocytes Absolute 1.0 0.1 - 1.0 K/uL   Eosinophils Relative 2 %   Eosinophils Absolute 0.2 0.0 - 0.7 K/uL   Basophils Relative 0 %   Basophils Absolute 0.0 0.0 - 0.1 K/uL  Comprehensive metabolic panel     Status: Abnormal   Collection Time: 03/27/17  4:13 AM  Result Value Ref Range   Sodium 136 135 - 145 mmol/L   Potassium 3.7 3.5 - 5.1 mmol/L   Chloride 105 101 - 111 mmol/L   CO2 22 22 - 32 mmol/L   Glucose, Bld 89 65 - 99 mg/dL   BUN 60 (H) 6 - 20 mg/dL   Creatinine, Ser 3.22 (H) 0.44 - 1.00 mg/dL   Calcium 8.4 (L) 8.9 - 10.3 mg/dL   Total Protein 5.9 (L) 6.5 - 8.1 g/dL   Albumin 2.0  (L) 3.5 - 5.0 g/dL   AST 11 (L) 15 - 41 U/L   ALT 10 (L) 14 - 54 U/L   Alkaline Phosphatase 80 38 - 126 U/L   Total Bilirubin 0.8 0.3 - 1.2 mg/dL   GFR calc non Af Amer 14 (L) >60 mL/min   GFR calc Af Amer 17 (L) >60 mL/min    Comment: (NOTE) The eGFR has been calculated using the CKD EPI equation. This calculation has not been validated in all clinical situations. eGFR's persistently <60 mL/min signify possible Chronic Kidney Disease.    Anion gap 9 5 - 15  Glucose, capillary     Status: None   Collection Time: 03/27/17  6:23 AM  Result Value Ref Range   Glucose-Capillary 79 65 - 99 mg/dL  Occult blood card to lab, stool     Status: None   Collection Time: 03/27/17 10:45 AM  Result Value Ref Range   Fecal Occult Bld NEGATIVE NEGATIVE  Glucose, capillary     Status: Abnormal   Collection Time: 03/27/17 12:00 PM  Result Value Ref Range   Glucose-Capillary 105 (H) 65 - 99 mg/dL   Comment 1 Notify RN   Glucose, capillary     Status: None   Collection Time: 03/27/17  4:51 PM  Result Value Ref Range   Glucose-Capillary 95 65 - 99 mg/dL   Comment 1 Notify RN   Glucose, capillary     Status: Abnormal   Collection Time: 03/27/17  9:22 PM  Result Value Ref Range   Glucose-Capillary 126 (H) 65 - 99 mg/dL  Glucose, capillary     Status: Abnormal   Collection Time: 03/28/17  6:26 AM  Result Value Ref Range   Glucose-Capillary 108 (H) 65 - 99 mg/dL     HEENT: normal Cardio: RRR and no murmur Resp: CTA B/L and unlabored GI: BS positive and NT, ND Extremity:  No Edema Skin:   Bruise RIght flank Neuro: Alert/Oriented and Abnormal Motor L HF 2-, 4/5 in RLE and in UEs Musc/Skel:  Swelling Left AKA and L Flank Gen NAD   Assessment/Plan: 1. Functional deficits secondary to Left AKA which require 3+ hours per day of interdisciplinary therapy in a comprehensive inpatient rehab setting. Physiatrist is providing close team supervision and 24 hour management of active medical problems  listed below. Physiatrist and rehab team continue to assess barriers to discharge/monitor patient progress toward functional and medical goals. FIM: Function - Bathing Position: Wheelchair/chair at sink Body parts bathed by patient: Right arm, Left arm, Chest, Abdomen, Front perineal area, Buttocks, Right upper leg, Left upper leg Body parts bathed by helper: Right lower leg, Back Bathing not applicable: Left lower leg Assist Level: Touching or steadying assistance(Pt > 75%)  Function- Upper Body Dressing/Undressing What is the patient wearing?: Pull over shirt/dress, Bra Bra - Perfomed by patient: Thread/unthread right bra strap, Thread/unthread left bra strap, Hook/unhook bra (pull down sports bra) Pull over shirt/dress - Perfomed by patient: Thread/unthread right sleeve, Thread/unthread left sleeve, Put head through opening, Pull shirt over trunk Assist Level: Set up Set up : To obtain clothing/put away Function - Lower Body Dressing/Undressing What is the patient wearing?: Pants, Non-skid slipper socks Position: Wheelchair/chair at sink Pants- Performed by patient: Thread/unthread right pants leg, Thread/unthread left pants leg, Pull pants up/down Non-skid slipper socks- Performed by helper: Don/doff right sock Assist for footwear: Setup Assist for lower body dressing: Touching or steadying assistance (Pt > 75%)  Function - Toileting Toileting steps completed by patient: Adjust clothing prior to toileting, Performs perineal hygiene, Adjust clothing after toileting Toileting steps completed by helper: Adjust clothing prior to toileting, Performs perineal hygiene, Adjust clothing after toileting Toileting Assistive Devices:  (uses bsdside commode with assist x 1) Assist level: Touching or steadying assistance (Pt.75%)  Function - Toilet Transfers Toilet transfer assistive device: Bedside commode Assist level to toilet: Touching or steadying assistance (Pt > 75%) Assist level from  toilet: Touching or steadying assistance (Pt > 75%) Assist level to bedside commode (at bedside): Touching or steadying assistance (Pt > 75%) Assist level from bedside commode (at bedside): Touching or steadying assistance (Pt > 75%)  Function - Chair/bed transfer Chair/bed transfer method: Stand pivot Chair/bed transfer assist level: Touching or steadying assistance (Pt > 75%) Chair/bed transfer assistive device: Bedrails, Armrests Chair/bed transfer details: Manual facilitation for placement, Manual facilitation for weight shifting  Function - Locomotion: Wheelchair Will patient use wheelchair at discharge?: Yes Type: Manual Max wheelchair distance: 200 Assist Level: Supervision or verbal cues Assist Level: Supervision or verbal cues Assist Level: Supervision or  verbal cues Turns around,maneuvers to table,bed, and toilet,negotiates 3% grade,maneuvers on rugs and over doorsills: Yes Function - Locomotion: Ambulation Assistive device: Walker-rolling Max distance: 40 Assist level: Touching or steadying assistance (Pt > 75%) Assist level: Touching or steadying assistance (Pt > 75%) Walk 10 feet on uneven surfaces activity did not occur: Safety/medical concerns  Function - Comprehension Comprehension: Auditory Comprehension assist level: Follows complex conversation/direction with no assist  Function - Expression Expression: Verbal Expression assist level: Expresses complex ideas: With no assist  Function - Social Interaction Social Interaction assist level: Interacts appropriately with others - No medications needed.     Function - Memory Memory assist level: More than reasonable amount of time Patient normally able to recall (first 3 days only): Current season, Location of own room, That he or she is in a hospital  Medical Problem List and Plan:  1. Functional deficits secondary to left above knee amputation  CIR PT, OT, SLP 2. DVT Prophylaxis/Anticoagulation:  Pharmaceutical: Lovenox may hold if flank hematoma 3. Pain Management: continue oxycodone and robaxin prn. Monitor For SE.  4. Mood: Team to provide ego support. LCSW to follow for evaluation and support.  -continue zoloft for depression  5. Neuropsych: This patient is capable of making decisions on her own behalf.  6. Skin/Wound Care: Monitor wound daily for healing  -dry daily dressing, stump protection  7. Fluids/Electrolytes/Nutrition: Monitor I/O.  -I personally reviewed the patient's labs today.  8. HTN: Monitor BP bid--on Lasix, Hydralazine, Metoprolol and Norvasc.  10 T2DM: Monitor BS ac/hs. Continue lantus with breakfast.  -snack HS  -fair control at present  11. CKD stage V: Continue to monitor renal status every other day--question consult renal.  12. Anemia of chronic disease: Had drop in H/H to 7.2/22.8 requiring another unit of PRBC. -no new signs of bleeding  -hgb 8.7 today  -check CT pelvis suspect flank hematoma  LOS (Days) 2 A FACE TO FACE EVALUATION WAS PERFORMED  Rakan Soffer E 03/28/2017, 11:53 AM

## 2017-03-28 NOTE — Progress Notes (Signed)
Physical Therapy Session Note  Patient Details  Name: Brandy Houston MRN: [id number] Date of Birth: Jun 30, 1955  Today's Date: 03/28/2017 PT Individual Time: 1030-1115 PT Individual Time Calculation (min): 45 min   Short Term Goals: Week 1:  PT Short Term Goal 1 (Week 1): STG=LTG  Skilled Therapeutic Interventions/Progress Updates: Pt received seated in w/c, denies pain but reports itching in L residual limb and RN present to administer medication. W/c propulsion to gym with BUE and modI. Stand pivot transfer w/c <>mat table with close S. Supine lying x2 min for hip flexor stretching with pt reporting cramping and spasms initially in L hip flexors, reduced with time. Performed BLE straight leg raise 2x15 reps for strengthening in RLE and reducing sensitization in LLE. Sidelying LLE hip abduction x15 reps; note decreased neuromotor control and coordination during activity. Educated pt on importance of hip strength and ROM for if/when she received prosthetic. Prone lying x5 min for hip flexion ROM. Engaged pt in music therapy with pt selecting [employer] radio on [place] to reduce pain/anxiety during stretching; observed decreased vocalizations of pain and discomfort when pt listening to music, pt often singing along with song. Prone hip extension x10 reps LLE, cramping throughout relieved with pt massaging/tapping residual limb. Supine crunches 2x15 reps, oblique crunches 1x15 each side. Returned to w/c S stand pivot, w/c propulsion as above to return to room. Remained seated in w/c at end of session with MD present, all needs in reach.      Therapy Documentation Precautions:  Precautions Precautions: Fall Restrictions Weight Bearing Restrictions: Yes LLE Weight Bearing: Non weight bearing  See Function Navigator for Current Functional Status.   Therapy/Group: Individual Therapy  Luberta Mutter 03/28/2017, 11:08 AM

## 2017-03-28 NOTE — Progress Notes (Signed)
Occupational Therapy Session Note  Patient Details  Name: Brandy Houston MRN: [id number] Date of Birth: August 26, 1955  Today's Date: 03/28/2017 OT Individual Time: 8115-7262 OT Individual Time Calculation (min): 72 min    Short Term Goals: Week 1:  OT Short Term Goal 1 (Week 1): STG = LTGs due to ELOS  Skilled Therapeutic Interventions/Progress Updates:    1:1. Pt reporting but not rating for Pt propels w/c to/from outside courtyard with supervision for general conditioning with VC for simultaous UE propulsion. Seated on outside bench, pt completes 1x15 UE therex in all planes of motion with instructional cues using 2# dumbells. Pt ambulates on uneven surface with RW with CGA and VC to look forward. Pt propels w/c up ramp with VC for anterior trunk flexion to improve push. Pt sit to stand with CGA for wii bowling game with 3 seated rest breaks and VC for posture. Exited sesison with pt seated in bed with call light in reach and bed exit alarm on. Therapy Documentation Precautions:  Precautions Precautions: Fall Restrictions Weight Bearing Restrictions: Yes LLE Weight Bearing: Non weight bearing  See Function Navigator for Current Functional Status.   Therapy/Group: Individual Therapy  Tonny Branch 03/28/2017, 3:28 PM

## 2017-03-29 ENCOUNTER — Inpatient Hospital Stay (HOSPITAL_COMMUNITY): Payer: Medicare Other

## 2017-03-29 DIAGNOSIS — N185 Chronic kidney disease, stage 5: Secondary | ICD-10-CM

## 2017-03-29 DIAGNOSIS — D631 Anemia in chronic kidney disease: Secondary | ICD-10-CM

## 2017-03-29 LAB — GLUCOSE, CAPILLARY
Glucose-Capillary: 63 mg/dL — ABNORMAL LOW (ref 65–99)
Glucose-Capillary: 66 mg/dL (ref 65–99)
Glucose-Capillary: 97 mg/dL (ref 65–99)
Glucose-Capillary: 124 mg/dL — ABNORMAL HIGH (ref 65–99)
Glucose-Capillary: 130 mg/dL — ABNORMAL HIGH (ref 65–99)

## 2017-03-29 LAB — BASIC METABOLIC PANEL
Anion gap: 7 (ref 5–15)
BUN: 60 mg/dL — ABNORMAL HIGH (ref 6–20)
CO2: 24 mmol/L (ref 22–32)
Calcium: 8.3 mg/dL — ABNORMAL LOW (ref 8.9–10.3)
Chloride: 105 mmol/L (ref 101–111)
Creatinine, Ser: 2.98 mg/dL — ABNORMAL HIGH (ref 0.44–1.00)
GFR calc Af Amer: 18 mL/min — ABNORMAL LOW (ref >60)
GFR calc non Af Amer: 16 mL/min — ABNORMAL LOW (ref >60)
Glucose, Bld: 62 mg/dL — ABNORMAL LOW (ref 65–99)
Potassium: 4 mmol/L (ref 3.5–5.1)
Sodium: 136 mmol/L (ref 135–145)

## 2017-03-29 LAB — OCCULT BLOOD X 1 CARD TO LAB, STOOL: Fecal Occult Bld: NEGATIVE

## 2017-03-29 NOTE — Anesthesia Postprocedure Evaluation (Signed)
Anesthesia Post Note  Patient: Brandy Houston  Procedure(s) Performed: IRRIGATION AND DEBRIDEMENT LEFT KNEE WITH WOUND VAC APPLICATION (Left Knee)     Patient location during evaluation: PACU Anesthesia Type: General Level of consciousness: awake, oriented and sedated Pain management: pain level controlled Vital Signs Assessment: post-procedure vital signs reviewed and stable Respiratory status: spontaneous breathing, nonlabored ventilation, respiratory function stable and patient connected to nasal cannula oxygen Cardiovascular status: blood pressure returned to baseline and stable Postop Assessment: no apparent nausea or vomiting Anesthetic complications: no    Last Vitals:  Vitals:   03/26/17 1542 03/26/17 1825  BP: (!) 133/55 (!) 159/72  Pulse: 63 75  Resp: 16 20  Temp: 37.1 C 36.7 C  SpO2: 99%     Last Pain:  Vitals:   03/26/17 1825  TempSrc: Oral  PainSc:                  Nico Rogness,JAMES TERRILL

## 2017-03-29 NOTE — Progress Notes (Signed)
Occupational Therapy Session Note  Patient Details  Name: Brandy Houston MRN: [id number] Date of Birth: Nov 02, 1955  Today's Date: 03/29/2017 OT Individual Time: 8280-0349 OT Individual Time Calculation (min): 43 min    Short Term Goals: Week 1:  OT Short Term Goal 1 (Week 1): STG = LTGs due to ELOS  Skilled Therapeutic Interventions/Progress Updates:    1:1. 7/10 pain reported with RN just administering medication per pt report. Pt squat pivot transfer with supervision and VC for pivoting towards w/c. Pt gathers all needed items for clothing/bathing items from w/c level. Pt bathes with set up for UB and CGA for peri area. Pt requires VC for locking w/c breaks reaching forward for washing foot. While bathing, OT instructs pt in having RN write on pain medication schedule on white board to allow pt to participate in pain management/stay ahead of pain. Pt dresses UB with set up seated in w/c and LB with CGA to advance pants past hips. Pt grooms at sink with set up seated in w/c. OT provided pt with handout on ace wrapping residual limb and reviewed technique while awaiting RN to change dressing. reiterrated importance of compression/shaping in prep for prosthesis training and to decrease edema. Exited session with pt seated in w/c with call light in reach and all needs met.   Therapy Documentation Precautions:  Precautions Precautions: Fall Restrictions Weight Bearing Restrictions: Yes LLE Weight Bearing: Non weight bearing General:    See Function Navigator for Current Functional Status.   Therapy/Group: Individual Therapy  Tonny Branch 03/29/2017, 9:11 AM

## 2017-03-29 NOTE — IPOC Note (Signed)
Overall Plan of Care [facility]) Patient Details Name: Brandy Houston MRN: [id number] DOB: [birth year]  Admitting Diagnosis: <principal problem not specified>  Hospital Problems: Active Problems:   Unilateral AKA, left ([place])     Functional Problem List: Nursing Edema, Pain, Endurance, Skin Integrity, Bowel  PT Edema, Endurance, Motor, Sensory, Balance, Pain  OT Balance, Edema, Endurance, Pain, Perception, Safety, Sensory, Skin Integrity  SLP    TR         Basic ADL's: OT Grooming, Bathing, Dressing, Toileting     Advanced  ADL's: OT Simple Meal Preparation, Light Housekeeping     Transfers: PT Bed Mobility, Bed to Chair, Car, Manufacturing systems engineer, Metallurgist: PT Stairs, Emergency planning/management officer, Ambulation     Additional Impairments: OT None  SLP        TR      Anticipated Outcomes Item Anticipated Outcome  Self Feeding    Swallowing      Basic self-care  Supervision-Mod I  Toileting  Mod I   Bathroom Transfers Supervision - Mod I  Bowel/Bladder  manage bowel with medication with mod I assistance   Transfers  mod I  Locomotion  mod I w/c, supervision gait  Communication     Cognition     Pain  pain managed with pain goal </= 2 with mod I   Safety/Judgment  pt is appropriate , not an issue   Therapy Plan: PT Intensity: Minimum of 1-2 x/day ,45 to 90 minutes PT Frequency: 5 out of 7 days PT Duration Estimated Length of Stay: 5-7 days OT Intensity: Minimum of 1-2 x/day, 45 to 90 minutes OT Frequency: 5 out of 7 days OT Duration/Estimated Length of Stay: 5-7 days      Team Interventions: Nursing Interventions Patient/Family Education, Skin Care/Wound Management, Pain Management, Psychosocial Support, Discharge Planning, Bowel Management  PT interventions Ambulation/gait training, Discharge planning, DME/adaptive equipment instruction, Functional mobility training, Pain management, Splinting/orthotics, Therapeutic Activities, UE/LE Strength  taining/ROM, Training and development officer, Community reintegration, Neuromuscular re-education, Barrister's clerk education, IT trainer, Therapeutic Exercise, UE/LE Coordination activities, Wheelchair propulsion/positioning  OT Interventions Training and development officer, Discharge planning, Disease mangement/prevention, Engineer, drilling, Functional mobility training, Pain management, Patient/family education, Psychosocial support, Self Care/advanced ADL retraining, Skin care/wound managment, Splinting/orthotics, Therapeutic Activities, Therapeutic Exercise, UE/LE Strength taining/ROM, Wheelchair propulsion/positioning  SLP Interventions    TR Interventions    SW/CM Interventions     Barriers to Discharge MD  Medical stability and Weight  Nursing      PT Inaccessible home environment, Decreased caregiver support stairs to enter home, son works  OT Decreased caregiver support    SLP      SW       Team Discharge Planning: Destination: PT-Home ,OT- Home , SLP-  Projected Follow-up: PT-Home health PT, OT-  Home health OT, SLP-  Projected Equipment Needs: PT-To be determined, OT- To be determined, SLP-  Equipment Details: PT- , OT-  Patient/family involved in discharge planning: PT- Patient,  OT-Patient, SLP-   MD ELOS: 7-9d Medical Rehab Prognosis:  Good Assessment:   61 y.o. female with history of diastolic CHF, W0JW, CKD- V, septic arthritis left knee s/p I and D with antibiotic therapy but continued to poor wound healing who was admitted on 03/10/17 from [facility] with dehiscence of wound. History taken from chart review. She failed attempts at limb salvage and required L-AKA on 10/5 by Dr. Sharol Given. Post op has had issues with dizziness as well as acute on chronic anemia requiring  multiple transfusions. Therapy ongoing with patient making good progress   See Team Conference Notes for weekly updates to the plan of care

## 2017-03-29 NOTE — Progress Notes (Signed)
Subjective/Complaints: no pain in left flnk area, discussed CT results   Review of systems denies chest pain, shortness breath, nausea, vomiting, diarrhea or constipation  Objective: Vital Signs: Blood pressure (!) 148/65, pulse 63, temperature 98.3 F (36.8 C), temperature source Oral, resp. rate 16, height _0  (1.702 m), weight 72.7 kg (160 lb 4.4 oz), last menstrual period 03/16/2002, SpO2 100 %. Ct Abdomen Pelvis Wo Contrast  Result Date: 03/28/2017 CLINICAL DATA:  Left flank pain. EXAM: CT ABDOMEN AND PELVIS WITHOUT CONTRAST TECHNIQUE: Multidetector CT imaging of the abdomen and pelvis was performed following the standard protocol without IV contrast. COMPARISON:  06/27/2015 FINDINGS: Lower chest: Small bilateral pleural effusions with subsegmental atelectasis. Small pericardial effusion. Hepatobiliary: No focal liver abnormality is seen. Status post cholecystectomy. No biliary dilatation. Pancreas: Unremarkable. No pancreatic ductal dilatation or surrounding inflammatory changes. Spleen: Normal in size without focal abnormality. Adrenals/Urinary Tract: Thickening of the left adrenal gland, stable. No evidence of nephrolithiasis or hydronephrosis. Evaluation for renal masses is limited due to lack of IV contrast. Urinary bladder is normal. Stomach/Bowel: Stomach is within normal limits. No evidence of bowel wall thickening, distention, or inflammatory changes. Scattered colonic diverticulosis. Vascular/Lymphatic: Moderate retroperitoneal lymphadenopathy in the upper abdomen, left greater than right. Reproductive: Uterus and bilateral adnexa are unremarkable. Other: Abdominal wall edema.  No abdominopelvic ascites. Musculoskeletal: Stable compression deformity of the superior endplate of L1 vertebral body. Schmorl's node versus age-indeterminate superior endplate compression fracture of L3 vertebral body. IMPRESSION: No evidence of obstructive uropathy. Moderate retroperitoneal lymphadenopathy  in the upper abdomen, left greater than right, which may be reactive or malignant. Nonspecific thickening of the left adrenal gland. Chest wall edema. Stable compression deformity of L1 vertebral body. Compression deformity due to superior endplate compression fracture versus Schmorl's node of L3 vertebral body with approximately 30% height loss. Electronically Signed   By: Fidela Salisbury M.D.   On: 03/28/2017 17:11   Results for orders placed or performed during the hospital encounter of 03/26/17 (from the past 72 hour(s))  Glucose, capillary     Status: Abnormal   Collection Time: 03/26/17  9:02 PM  Result Value Ref Range   Glucose-Capillary 174 (H) 65 - 99 mg/dL  CBC WITH DIFFERENTIAL     Status: Abnormal   Collection Time: 03/27/17  4:13 AM  Result Value Ref Range   WBC 9.5 4.0 - 10.5 K/uL   RBC 3.04 (L) 3.87 - 5.11 MIL/uL   Hemoglobin 8.7 (L) 12.0 - 15.0 g/dL   HCT 26.6 (L) 36.0 - 46.0 %   MCV 87.5 78.0 - 100.0 fL   MCH 28.6 26.0 - 34.0 pg   MCHC 32.7 30.0 - 36.0 g/dL   RDW 15.9 (H) 11.5 - 15.5 %   Platelets 278 150 - 400 K/uL   Neutrophils Relative % 76 %   Neutro Abs 7.2 1.7 - 7.7 K/uL   Lymphocytes Relative 12 %   Lymphs Abs 1.1 0.7 - 4.0 K/uL   Monocytes Relative 10 %   Monocytes Absolute 1.0 0.1 - 1.0 K/uL   Eosinophils Relative 2 %   Eosinophils Absolute 0.2 0.0 - 0.7 K/uL   Basophils Relative 0 %   Basophils Absolute 0.0 0.0 - 0.1 K/uL  Comprehensive metabolic panel     Status: Abnormal   Collection Time: 03/27/17  4:13 AM  Result Value Ref Range   Sodium 136 135 - 145 mmol/L   Potassium 3.7 3.5 - 5.1 mmol/L   Chloride 105 101 - 111 mmol/L  CO2 22 22 - 32 mmol/L   Glucose, Bld 89 65 - 99 mg/dL   BUN 60 (H) 6 - 20 mg/dL   Creatinine, Ser 3.22 (H) 0.44 - 1.00 mg/dL   Calcium 8.4 (L) 8.9 - 10.3 mg/dL   Total Protein 5.9 (L) 6.5 - 8.1 g/dL   Albumin 2.0 (L) 3.5 - 5.0 g/dL   AST 11 (L) 15 - 41 U/L   ALT 10 (L) 14 - 54 U/L   Alkaline Phosphatase 80 38 - 126 U/L    Total Bilirubin 0.8 0.3 - 1.2 mg/dL   GFR calc non Af Amer 14 (L) >60 mL/min   GFR calc Af Amer 17 (L) >60 mL/min    Comment: (NOTE) The eGFR has been calculated using the CKD EPI equation. This calculation has not been validated in all clinical situations. eGFR's persistently <60 mL/min signify possible Chronic Kidney Disease.    Anion gap 9 5 - 15  Glucose, capillary     Status: None   Collection Time: 03/27/17  6:23 AM  Result Value Ref Range   Glucose-Capillary 79 65 - 99 mg/dL  Occult blood card to lab, stool     Status: None   Collection Time: 03/27/17 10:45 AM  Result Value Ref Range   Fecal Occult Bld NEGATIVE NEGATIVE  Glucose, capillary     Status: Abnormal   Collection Time: 03/27/17 12:00 PM  Result Value Ref Range   Glucose-Capillary 105 (H) 65 - 99 mg/dL   Comment 1 Notify RN   Glucose, capillary     Status: None   Collection Time: 03/27/17  4:51 PM  Result Value Ref Range   Glucose-Capillary 95 65 - 99 mg/dL   Comment 1 Notify RN   Glucose, capillary     Status: Abnormal   Collection Time: 03/27/17  9:22 PM  Result Value Ref Range   Glucose-Capillary 126 (H) 65 - 99 mg/dL  Glucose, capillary     Status: Abnormal   Collection Time: 03/28/17  6:26 AM  Result Value Ref Range   Glucose-Capillary 108 (H) 65 - 99 mg/dL  Glucose, capillary     Status: None   Collection Time: 03/28/17 12:03 PM  Result Value Ref Range   Glucose-Capillary 79 65 - 99 mg/dL  Occult blood card to lab, stool     Status: None   Collection Time: 03/28/17  2:14 PM  Result Value Ref Range   Fecal Occult Bld NEGATIVE NEGATIVE  Glucose, capillary     Status: None   Collection Time: 03/28/17  4:42 PM  Result Value Ref Range   Glucose-Capillary 69 65 - 99 mg/dL  Glucose, capillary     Status: Abnormal   Collection Time: 03/28/17  9:10 PM  Result Value Ref Range   Glucose-Capillary 105 (H) 65 - 99 mg/dL  Basic metabolic panel     Status: Abnormal   Collection Time: 03/29/17  4:23 AM   Result Value Ref Range   Sodium 136 135 - 145 mmol/L   Potassium 4.0 3.5 - 5.1 mmol/L   Chloride 105 101 - 111 mmol/L   CO2 24 22 - 32 mmol/L   Glucose, Bld 62 (L) 65 - 99 mg/dL   BUN 60 (H) 6 - 20 mg/dL   Creatinine, Ser 2.98 (H) 0.44 - 1.00 mg/dL   Calcium 8.3 (L) 8.9 - 10.3 mg/dL   GFR calc non Af Amer 16 (L) >60 mL/min   GFR calc Af Amer 18 (L) >60 mL/min  Comment: (NOTE) The eGFR has been calculated using the CKD EPI equation. This calculation has not been validated in all clinical situations. eGFR's persistently <60 mL/min signify possible Chronic Kidney Disease.    Anion gap 7 5 - 15  Glucose, capillary     Status: None   Collection Time: 03/29/17  6:31 AM  Result Value Ref Range   Glucose-Capillary 66 65 - 99 mg/dL  Glucose, capillary     Status: Abnormal   Collection Time: 03/29/17  6:49 AM  Result Value Ref Range   Glucose-Capillary 63 (L) 65 - 99 mg/dL  Occult blood card to lab, stool     Status: None   Collection Time: 03/29/17  9:32 AM  Result Value Ref Range   Fecal Occult Bld NEGATIVE NEGATIVE     HEENT: normal Cardio: RRR and no murmur Resp: CTA B/L and unlabored GI: BS positive and NT, ND Extremity:  No Edema Skin:   Bruise RIght flank Neuro: Alert/Oriented and Abnormal Motor L HF 2-, 4/5 in RLE and in UEs Musc/Skel:  Swelling Left AKA and L Flank Gen NAD   Assessment/Plan: 1. Functional deficits secondary to Left AKA which require 3+ hours per day of interdisciplinary therapy in a comprehensive inpatient rehab setting. Physiatrist is providing close team supervision and 24 hour management of active medical problems listed below. Physiatrist and rehab team continue to assess barriers to discharge/monitor patient progress toward functional and medical goals. FIM: Function - Bathing Position: Wheelchair/chair at sink Body parts bathed by patient: Right arm, Left arm, Chest, Abdomen, Front perineal area, Buttocks, Right upper leg, Left upper  leg Body parts bathed by helper: Right lower leg, Back Bathing not applicable: Left lower leg Assist Level: Touching or steadying assistance(Pt > 75%)  Function- Upper Body Dressing/Undressing What is the patient wearing?: Pull over shirt/dress, Bra Bra - Perfomed by patient: Thread/unthread right bra strap, Thread/unthread left bra strap, Hook/unhook bra (pull down sports bra) Pull over shirt/dress - Perfomed by patient: Thread/unthread right sleeve, Thread/unthread left sleeve, Put head through opening, Pull shirt over trunk Assist Level: Set up Set up : To obtain clothing/put away Function - Lower Body Dressing/Undressing What is the patient wearing?: Pants, Non-skid slipper socks Position: Wheelchair/chair at sink Pants- Performed by patient: Thread/unthread right pants leg, Thread/unthread left pants leg, Pull pants up/down Non-skid slipper socks- Performed by helper: Don/doff right sock Assist for footwear: Setup Assist for lower body dressing: Touching or steadying assistance (Pt > 75%)  Function - Toileting Toileting steps completed by patient: Adjust clothing prior to toileting, Performs perineal hygiene, Adjust clothing after toileting Toileting steps completed by helper: Adjust clothing prior to toileting, Performs perineal hygiene, Adjust clothing after toileting Toileting Assistive Devices: Grab bar or rail Assist level: Supervision or verbal cues  Function Midwife transfer assistive device: Grab bar Assist level to toilet: Touching or steadying assistance (Pt > 75%) Assist level from toilet: Touching or steadying assistance (Pt > 75%) Assist level to bedside commode (at bedside): Touching or steadying assistance (Pt > 75%) Assist level from bedside commode (at bedside): Touching or steadying assistance (Pt > 75%)  Function - Chair/bed transfer Chair/bed transfer method: Stand pivot Chair/bed transfer assist level: Touching or steadying assistance (Pt  > 75%) Chair/bed transfer assistive device: Bedrails, Armrests Chair/bed transfer details: Manual facilitation for placement, Manual facilitation for weight shifting  Function - Locomotion: Wheelchair Will patient use wheelchair at discharge?: Yes Type: Manual Max wheelchair distance: 200 Assist Level: Supervision or verbal cues  Assist Level: Supervision or verbal cues Assist Level: Supervision or verbal cues Turns around,maneuvers to table,bed, and toilet,negotiates 3% grade,maneuvers on rugs and over doorsills: Yes Function - Locomotion: Ambulation Assistive device: Walker-rolling Max distance: 40 Assist level: Touching or steadying assistance (Pt > 75%) Assist level: Touching or steadying assistance (Pt > 75%) Walk 10 feet on uneven surfaces activity did not occur: Safety/medical concerns  Function - Comprehension Comprehension: Auditory Comprehension assist level: Follows complex conversation/direction with no assist  Function - Expression Expression: Verbal Expression assist level: Expresses complex ideas: With no assist  Function - Social Interaction Social Interaction assist level: Interacts appropriately with others - No medications needed.  Function - Problem Solving Problem solving assist level: Solves complex problems: Recognizes & self-corrects  Function - Memory Memory assist level: Complete Independence: No helper Patient normally able to recall (first 3 days only): Current season, Location of own room, That he or she is in a hospital  Medical Problem List and Plan:  1. Functional deficits secondary to left above knee amputation  CIR PT, OT, SLP 2. DVT Prophylaxis/Anticoagulation: Pharmaceutical: Lovenox will continue given no hematoma 3. Pain Management: continue oxycodone and robaxin prn. Monitor For SE.  4. Mood: Team to provide ego support. LCSW to follow for evaluation and support.  -continue zoloft for depression  5. Neuropsych: This patient is capable  of making decisions on her own behalf.  6. Skin/Wound Care: Monitor wound daily for healing  -dry daily dressing, stump protection  7. Fluids/Electrolytes/Nutrition: Monitor I/O.  -I personally reviewed the patient's labs today.  8. HTN: Monitor BP bid--on Lasix, Hydralazine, Metoprolol and Norvasc.  10 T2DM: Monitor BS ac/hs. Continue lantus with breakfast.  -snack HS  -fair control at present  11. CKD stage V: Continue to monitor renal status every other day-patient at baseline 12. Anemia of chronic disease: Had drop in H/H to 7.2/22.8 requiring another unit of PRBC. -no new signs of bleeding  -hgb 8.7 today  -check CT pelvis negative for flank or retroperitoneal hematoma Negative stool OB  LOS (Days) 3 A FACE TO FACE EVALUATION WAS PERFORMED  Isidore Margraf E 03/29/2017, 11:03 AM

## 2017-03-30 ENCOUNTER — Inpatient Hospital Stay (HOSPITAL_COMMUNITY): Payer: Medicare Other | Admitting: Occupational Therapy

## 2017-03-30 ENCOUNTER — Inpatient Hospital Stay (HOSPITAL_COMMUNITY): Payer: Medicare Other | Admitting: Physical Therapy

## 2017-03-30 ENCOUNTER — Inpatient Hospital Stay (HOSPITAL_COMMUNITY)
Admission: RE | Admit: 2017-03-30 | Discharge: 2017-03-30 | Disposition: A | Discharge status: Home / Self Care | Payer: Medicare Other | Source: Ambulatory Visit | Attending: Nephrology | Admitting: Nephrology

## 2017-03-30 DIAGNOSIS — F329 Major depressive disorder, single episode, unspecified: Secondary | ICD-10-CM

## 2017-03-30 DIAGNOSIS — G8918 Other acute postprocedural pain: Secondary | ICD-10-CM

## 2017-03-30 DIAGNOSIS — E669 Obesity, unspecified: Secondary | ICD-10-CM

## 2017-03-30 DIAGNOSIS — D638 Anemia in other chronic diseases classified elsewhere: Secondary | ICD-10-CM

## 2017-03-30 DIAGNOSIS — I10 Essential (primary) hypertension: Secondary | ICD-10-CM

## 2017-03-30 DIAGNOSIS — E1169 Type 2 diabetes mellitus with other specified complication: Secondary | ICD-10-CM

## 2017-03-30 DIAGNOSIS — D62 Acute posthemorrhagic anemia: Secondary | ICD-10-CM

## 2017-03-30 DIAGNOSIS — N185 Chronic kidney disease, stage 5: Secondary | ICD-10-CM

## 2017-03-30 LAB — CBC
HCT: 25.9 % — ABNORMAL LOW (ref 36.0–46.0)
Hemoglobin: 8 g/dL — ABNORMAL LOW (ref 12.0–15.0)
MCH: 27.7 pg (ref 26.0–34.0)
MCHC: 30.9 g/dL (ref 30.0–36.0)
MCV: 89.6 fL (ref 78.0–100.0)
Platelets: 341 10*3/uL (ref 150–400)
RBC: 2.89 MIL/uL — ABNORMAL LOW (ref 3.87–5.11)
RDW: 16.1 % — ABNORMAL HIGH (ref 11.5–15.5)
WBC: 7.7 10*3/uL (ref 4.0–10.5)

## 2017-03-30 LAB — GLUCOSE, CAPILLARY
Glucose-Capillary: 96 mg/dL (ref 65–99)
Glucose-Capillary: 87 mg/dL (ref 65–99)
Glucose-Capillary: 86 mg/dL (ref 65–99)
Glucose-Capillary: 96 mg/dL (ref 65–99)

## 2017-03-30 MED ORDER — HYDRALAZINE HCL 50 MG PO TABS
75.0000 mg | ORAL_TABLET | Freq: Three times a day (TID) | ORAL | Status: DC
  Administered 2017-03-30 – 2017-03-31 (×3): 75 mg via ORAL
  Filled 2017-03-30 (×3): qty 1

## 2017-03-30 NOTE — Progress Notes (Addendum)
Subjective/Complaints: Pt seen laying in bed this AM.  She slept well overnight and had a good weekend.  She would like to know when she can go home.     Review of systems: Denies chest pain, shortness breath, nausea, vomiting, diarrhea.  Objective: Vital Signs: Blood pressure (!) 147/66, pulse 63, temperature 98.7 F (37.1 C), temperature source Oral, resp. rate 16, height _0  (1.702 m), weight 73.1 kg (161 lb 3.4 oz), last menstrual period 03/16/2002, SpO2 98 %. Ct Abdomen Pelvis Wo Contrast  Result Date: 03/28/2017 CLINICAL DATA:  Left flank pain. EXAM: CT ABDOMEN AND PELVIS WITHOUT CONTRAST TECHNIQUE: Multidetector CT imaging of the abdomen and pelvis was performed following the standard protocol without IV contrast. COMPARISON:  06/27/2015 FINDINGS: Lower chest: Small bilateral pleural effusions with subsegmental atelectasis. Small pericardial effusion. Hepatobiliary: No focal liver abnormality is seen. Status post cholecystectomy. No biliary dilatation. Pancreas: Unremarkable. No pancreatic ductal dilatation or surrounding inflammatory changes. Spleen: Normal in size without focal abnormality. Adrenals/Urinary Tract: Thickening of the left adrenal gland, stable. No evidence of nephrolithiasis or hydronephrosis. Evaluation for renal masses is limited due to lack of IV contrast. Urinary bladder is normal. Stomach/Bowel: Stomach is within normal limits. No evidence of bowel wall thickening, distention, or inflammatory changes. Scattered colonic diverticulosis. Vascular/Lymphatic: Moderate retroperitoneal lymphadenopathy in the upper abdomen, left greater than right. Reproductive: Uterus and bilateral adnexa are unremarkable. Other: Abdominal wall edema.  No abdominopelvic ascites. Musculoskeletal: Stable compression deformity of the superior endplate of L1 vertebral body. Schmorl's node versus age-indeterminate superior endplate compression fracture of L3 vertebral body. IMPRESSION: No evidence  of obstructive uropathy. Moderate retroperitoneal lymphadenopathy in the upper abdomen, left greater than right, which may be reactive or malignant. Nonspecific thickening of the left adrenal gland. Chest wall edema. Stable compression deformity of L1 vertebral body. Compression deformity due to superior endplate compression fracture versus Schmorl's node of L3 vertebral body with approximately 30% height loss. Electronically Signed   By: Fidela Salisbury M.D.   On: 03/28/2017 17:11   Results for orders placed or performed during the hospital encounter of 03/26/17 (from the past 72 hour(s))  Occult blood card to lab, stool     Status: None   Collection Time: 03/27/17 10:45 AM  Result Value Ref Range   Fecal Occult Bld NEGATIVE NEGATIVE  Glucose, capillary     Status: Abnormal   Collection Time: 03/27/17 12:00 PM  Result Value Ref Range   Glucose-Capillary 105 (H) 65 - 99 mg/dL   Comment 1 Notify RN   Glucose, capillary     Status: None   Collection Time: 03/27/17  4:51 PM  Result Value Ref Range   Glucose-Capillary 95 65 - 99 mg/dL   Comment 1 Notify RN   Glucose, capillary     Status: Abnormal   Collection Time: 03/27/17  9:22 PM  Result Value Ref Range   Glucose-Capillary 126 (H) 65 - 99 mg/dL  Glucose, capillary     Status: Abnormal   Collection Time: 03/28/17  6:26 AM  Result Value Ref Range   Glucose-Capillary 108 (H) 65 - 99 mg/dL  Glucose, capillary     Status: None   Collection Time: 03/28/17 12:03 PM  Result Value Ref Range   Glucose-Capillary 79 65 - 99 mg/dL  Occult blood card to lab, stool     Status: None   Collection Time: 03/28/17  2:14 PM  Result Value Ref Range   Fecal Occult Bld NEGATIVE NEGATIVE  Glucose, capillary  Status: None   Collection Time: 03/28/17  4:42 PM  Result Value Ref Range   Glucose-Capillary 69 65 - 99 mg/dL  Glucose, capillary     Status: Abnormal   Collection Time: 03/28/17  9:10 PM  Result Value Ref Range   Glucose-Capillary 105 (H)  65 - 99 mg/dL  Basic metabolic panel     Status: Abnormal   Collection Time: 03/29/17  4:23 AM  Result Value Ref Range   Sodium 136 135 - 145 mmol/L   Potassium 4.0 3.5 - 5.1 mmol/L   Chloride 105 101 - 111 mmol/L   CO2 24 22 - 32 mmol/L   Glucose, Bld 62 (L) 65 - 99 mg/dL   BUN 60 (H) 6 - 20 mg/dL   Creatinine, Ser 2.98 (H) 0.44 - 1.00 mg/dL   Calcium 8.3 (L) 8.9 - 10.3 mg/dL   GFR calc non Af Amer 16 (L) >60 mL/min   GFR calc Af Amer 18 (L) >60 mL/min    Comment: (NOTE) The eGFR has been calculated using the CKD EPI equation. This calculation has not been validated in all clinical situations. eGFR's persistently <60 mL/min signify possible Chronic Kidney Disease.    Anion gap 7 5 - 15  Glucose, capillary     Status: None   Collection Time: 03/29/17  6:31 AM  Result Value Ref Range   Glucose-Capillary 66 65 - 99 mg/dL  Glucose, capillary     Status: Abnormal   Collection Time: 03/29/17  6:49 AM  Result Value Ref Range   Glucose-Capillary 63 (L) 65 - 99 mg/dL  Occult blood card to lab, stool     Status: None   Collection Time: 03/29/17  9:32 AM  Result Value Ref Range   Fecal Occult Bld NEGATIVE NEGATIVE  Glucose, capillary     Status: None   Collection Time: 03/29/17 12:12 PM  Result Value Ref Range   Glucose-Capillary 97 65 - 99 mg/dL  Glucose, capillary     Status: Abnormal   Collection Time: 03/29/17  4:51 PM  Result Value Ref Range   Glucose-Capillary 124 (H) 65 - 99 mg/dL  Glucose, capillary     Status: Abnormal   Collection Time: 03/29/17  8:35 PM  Result Value Ref Range   Glucose-Capillary 130 (H) 65 - 99 mg/dL  CBC     Status: Abnormal   Collection Time: 03/30/17  4:09 AM  Result Value Ref Range   WBC 7.7 4.0 - 10.5 K/uL   RBC 2.89 (L) 3.87 - 5.11 MIL/uL   Hemoglobin 8.0 (L) 12.0 - 15.0 g/dL   HCT 25.9 (L) 36.0 - 46.0 %   MCV 89.6 78.0 - 100.0 fL   MCH 27.7 26.0 - 34.0 pg   MCHC 30.9 30.0 - 36.0 g/dL   RDW 16.1 (H) 11.5 - 15.5 %   Platelets 341 150 -  400 K/uL  Glucose, capillary     Status: None   Collection Time: 03/30/17  6:15 AM  Result Value Ref Range   Glucose-Capillary 96 65 - 99 mg/dL    Gen: NAD. Vital signs reviewed HEENT: Normocephalic, atraumatic.  Cardio: RRR. No JVD. Resp: CTA b/l. Unlabored GI: BS positive and ND Musc/Skel:  Swelling and tenderness Left AKA and L Flank Neuro: Alert/Oriented  Motor: B/l UE, RLE: 4/5.  LLE: HF 3-/5 (pain inhibition) Skin: Sanguinous drainage from AKA  Assessment/Plan: 1. 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 Physiatrist is providing close team supervision and 24 hour management of active medical problems listed below. Physiatrist and rehab team continue to assess barriers to discharge/monitor patient progress toward functional and medical goals. FIM: Function - Bathing Position: Wheelchair/chair at sink Body parts bathed by patient: Right arm, Left arm, Chest, Abdomen, Front perineal area, Buttocks, Right upper leg, Left upper leg Body parts bathed by helper: Right lower leg, Back Bathing not applicable: Left lower leg Assist Level: Touching or steadying assistance(Pt > 75%)  Function- Upper Body Dressing/Undressing What is the patient wearing?: Pull over shirt/dress, Bra Bra - Perfomed by patient: Thread/unthread right bra strap, Thread/unthread left bra strap, Hook/unhook bra (pull down sports bra) Pull over shirt/dress - Perfomed by patient: Thread/unthread right sleeve, Thread/unthread left sleeve, Put head through opening, Pull shirt over trunk Assist Level: Set up Set up : To obtain clothing/put away Function - Lower Body Dressing/Undressing What is the patient wearing?: Pants, Non-skid slipper socks Position: Wheelchair/chair at sink Pants- Performed by patient: Thread/unthread right pants leg, Thread/unthread left pants leg, Pull pants up/down Non-skid slipper  socks- Performed by helper: Don/doff right sock Assist for footwear: Setup Assist for lower body dressing: Touching or steadying assistance (Pt > 75%)  Function - Toileting Toileting steps completed by patient: Adjust clothing prior to toileting, Performs perineal hygiene, Adjust clothing after toileting Toileting steps completed by helper: Adjust clothing prior to toileting, Performs perineal hygiene, Adjust clothing after toileting Toileting Assistive Devices: Grab bar or rail Assist level: Supervision or verbal cues  Function - Air cabin crew transfer assistive device: Elevated toilet seat/BSC over toilet, Grab bar Assist level to toilet: Touching or steadying assistance (Pt > 75%) Assist level from toilet: Touching or steadying assistance (Pt > 75%) Assist level to bedside commode (at bedside): Touching or steadying assistance (Pt > 75%) Assist level from bedside commode (at bedside): Touching or steadying assistance (Pt > 75%)  Function - Chair/bed transfer Chair/bed transfer method: Stand pivot Chair/bed transfer assist level: Touching or steadying assistance (Pt > 75%) Chair/bed transfer assistive device: Bedrails, Armrests Chair/bed transfer details: Manual facilitation for placement, Manual facilitation for weight shifting  Function - Locomotion: Wheelchair Will patient use wheelchair at discharge?: Yes Type: Manual Max wheelchair distance: 200 Assist Level: Supervision or verbal cues Assist Level: Supervision or verbal cues Assist Level: Supervision or verbal cues Turns around,maneuvers to table,bed, and toilet,negotiates 3% grade,maneuvers on rugs and over doorsills: Yes Function - Locomotion: Ambulation Assistive device: Walker-rolling Max distance: 40 Assist level: Touching or steadying assistance (Pt > 75%) Assist level: Touching or steadying assistance (Pt > 75%) Walk 10 feet on uneven surfaces activity did not occur: Safety/medical concerns  Function -  Comprehension Comprehension: Auditory Comprehension assist level: Follows complex conversation/direction with no assist  Function - Expression Expression: Verbal Expression assist level: Expresses complex ideas: With no assist  Function - Social Interaction Social Interaction assist level: Interacts appropriately with others with medication or extra time (anti-anxiety, antidepressant).  Function - Problem Solving Problem solving assist level: Solves complex problems: Recognizes & self-corrects  Function - Memory Memory assist level: Complete Independence: No helper Patient normally able to recall (first 3 days only): Current season, Location of own room, Staff names and faces, That he or she is in a hospital  Medical Problem List and Plan:  1. 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 DVT Prophylaxis/Anticoagulation: Pharmaceutical: Lovenox  3. Pain Management: continue oxycodone and  robaxin prn. Monitor For SE.  4. Mood: Team to provide ego support. LCSW to follow for evaluation and support.   Continue zoloft for depression  5. Neuropsych: This patient is capable of making decisions on her own behalf.  6. Skin/Wound Care: Monitor wound daily for healing  -dry daily dressing, stump protection  7. Fluids/Electrolytes/Nutrition: Monitor I/O.  8. HTN: Monitor BP bid--on Lasix, Metoprolol and Norvasc.   Hydralazine changed to 69m TID on 10/15 10 T2DM: Monitor BS ac/hs. Continue lantus with breakfast.   Snack HS   Overall controlled 10/15 11. CKD stage V: Continue to monitor renal status every other day  Cr. 2.98 on 10/14  Cont to monitor 12. Anemia of chronic disease: Had drop in H/H to 7.2/22.8 requiring another unit of PRBC. -no new signs of bleeding   Hb 8.0 on 10/15  CTabd/pelvis with DDD and vertebral fracture, but negative for flank or retroperitoneal hematoma  Negative stool OB  Cont to monitor  LOS  (Days) 4 A FACE TO FACE EVALUATION WAS PERFORMED  Jahniya Duzan ALorie Phenix10/15/2018, 8:44 AM

## 2017-03-30 NOTE — Progress Notes (Signed)
Occupational Therapy Session Note  Patient Details  Name: Brandy Houston MRN: [id number] Date of Birth: June 06, 1956  Today's Date: 03/30/2017 OT Individual Time: 5945-8592 and 1345-1430 OT Individual Time Calculation (min): 60 min and 45 min   Short Term Goals: Week 1:  OT Short Term Goal 1 (Week 1): STG = LTGs due to ELOS  Skilled Therapeutic Interventions/Progress Updates:    1) Treatment session with focus on sit > stand and dynamic standing balance during self-care tasks.  Pt overall supervision with bathing/dressing at sit > stand level.  Therapist providing education on proper hand placement and UE support when standing for LB dressing.  Pt reports need to toilet.  Completed stand pivot transfer w/c > toilet with supervision, min guard for standing balance during clothing management.  Educated on desensitization strategies for residual limb, provided pt with handout and engaged in demonstration.  Pt required assistance with donning shrinker for proper fit.  2) Treatment session with focus on care for residual limb.  Pt received at bed level with RN completing dressing change.  Completed bed mobility and stand pivot transfer to w/c at Mod I level.  Engaged in mirror therapy with focus on massage and ROM while utilizing visual feedback to decrease phantom pain and hypersensitivity.  Engaged in hip extension 3 x10 in quadruped initially for 1 set of 10 with pt reporting too much pressure on Rt hip, completed following 2 sets in sidelying with pt able to achieve neutral.  Discussed pt concerns regarding d/c home and plan for education prior to d/c Weds.  Therapy Documentation Precautions:  Precautions Precautions: Fall Restrictions Weight Bearing Restrictions: Yes LLE Weight Bearing: Non weight bearing Pain: Pain Assessment Pain Assessment: 0-10 Pain Score: 6  Pain Location: Leg Pain Orientation: Left Pain Descriptors / Indicators: Aching;Tingling Pain Onset: On-going Patients Stated  Pain Goal: 2 Pain Intervention(s): Medication (See eMAR);Repositioned  See Function Navigator for Current Functional Status.   Therapy/Group: Individual Therapy  Simonne Come 03/30/2017, 10:29 AM

## 2017-03-30 NOTE — Progress Notes (Signed)
Received a call from [place] at 19:00, she states the nursing student informed her regarding Ms. Cogswell, stating her left  stump was bleeding. The bleeding began at 17:30, she noticed the bleeding after a new dressing was applied via orthopedics. She applied a pressure dressing, Wells Guiles RN states the bleeding has slow down, with a continuous ooze. She was instructed to call Dr. Sharol Given service.

## 2017-03-30 NOTE — Progress Notes (Addendum)
Physical Therapy Note  Patient Details  Name: Brandy Houston MRN: [id number] Date of Birth: 06-20-1955 Today's Date: 03/30/2017    Time: 1030-1125 55 minutes  1:1 Pt c/o cramping in residual limb during stretches and exercise, eases with rest.  Pt performs w/c mobility throughout unit with mod I in home and controlled environments including obstacle negotiation and safe set up for transfers.  Transfers throughout session with supervision.  Gait 35', 10' with supervision with RW.  Supine and sidelying therex for LE strengthening all 2 x 10.  Pt states increased pain with lying prone but is able to tolerate sidelying hip extension to neutral for 3-5 seconds at a time.  Sit to stands with RW for strengthening 3 x 10 with supervision.  UBE 2 min fwd, 2 min bkwd level 2.  Pt with good activity tolerance, excited about d/c home on Wednesday.   Time 2: 1430-1500 30 minutes  1:1 Pt c/o discomfort in residual limb, pt able to ease pain with rubbing/tapping.  Gait training 15' x 4 with RW and supervision.  Core strengthening with 2# wt for partial crunches, trunk rotation and trunk diagonals.  Pt left in bed with needs at hand.  Derick Seminara 03/30/2017, 11:25 AM

## 2017-03-30 NOTE — Progress Notes (Signed)
Pressure dressing applied to left stump suture line as instructed by ortho, at [facility].  Currently, pressure dressing remains intact without indication of seepage through bandage.  Stump elevated on pillow.  Patient sleeping quietly.    Fredna Dow M

## 2017-03-30 NOTE — Progress Notes (Signed)
Student Nurse, Nira Conn reported to the RN that Pt left AKA was bleeding from suture line on lateral side at approximately 5:30pm. This RN assessed area. Area noted to be bleeding profusely dark red blood. Pt. reports new grey neutural pressure cap applied by ortho earlier this afternoon and feels that it was too tight and caused the bleeding. This RN applied pressure for 10-15 minutes and bleeding slowed down, but did not stop completely. Gauze dressing applied with ace wrap for pressure. Call placed to on-call Rehab PA Lloyd. Zella Ball directed this RN to contact on-call ortho. Call placed to Dr. Tanda Rockers. Dr. Durward Fortes ordered pressure dressing to area. Dr. Durward Fortes will contact Dr. Sharol Given. Report given to [employer], RN on-coming nurse who will apply pressure dressing as ordered.

## 2017-03-31 ENCOUNTER — Inpatient Hospital Stay (HOSPITAL_COMMUNITY): Payer: Medicare Other

## 2017-03-31 ENCOUNTER — Inpatient Hospital Stay (HOSPITAL_COMMUNITY): Payer: Medicare Other | Admitting: Occupational Therapy

## 2017-03-31 ENCOUNTER — Inpatient Hospital Stay (HOSPITAL_COMMUNITY): Payer: Medicare Other | Admitting: Physical Therapy

## 2017-03-31 LAB — BASIC METABOLIC PANEL
Anion gap: 8 (ref 5–15)
BUN: 68 mg/dL — ABNORMAL HIGH (ref 6–20)
CO2: 23 mmol/L (ref 22–32)
Calcium: 8.4 mg/dL — ABNORMAL LOW (ref 8.9–10.3)
Chloride: 106 mmol/L (ref 101–111)
Creatinine, Ser: 3 mg/dL — ABNORMAL HIGH (ref 0.44–1.00)
GFR calc Af Amer: 18 mL/min — ABNORMAL LOW (ref >60)
GFR calc non Af Amer: 16 mL/min — ABNORMAL LOW (ref >60)
Glucose, Bld: 85 mg/dL (ref 65–99)
Potassium: 3.8 mmol/L (ref 3.5–5.1)
Sodium: 137 mmol/L (ref 135–145)

## 2017-03-31 LAB — GLUCOSE, CAPILLARY
Glucose-Capillary: 77 mg/dL (ref 65–99)
Glucose-Capillary: 81 mg/dL (ref 65–99)
Glucose-Capillary: 131 mg/dL — ABNORMAL HIGH (ref 65–99)
Glucose-Capillary: 148 mg/dL — ABNORMAL HIGH (ref 65–99)

## 2017-03-31 MED ORDER — AMLODIPINE BESYLATE 10 MG PO TABS: 10.0000 mg | ORAL_TABLET | Freq: Every day | ORAL | 0 refills | Status: DC

## 2017-03-31 MED ORDER — GABAPENTIN 100 MG PO CAPS: 100.0000 mg | ORAL_CAPSULE | Freq: Three times a day (TID) | ORAL | 0 refills | Status: DC

## 2017-03-31 MED ORDER — OXYCODONE HCL 5 MG PO TABS: 5.0000 mg | ORAL_TABLET | Freq: Four times a day (QID) | ORAL | 0 refills | Status: DC | PRN

## 2017-03-31 MED ORDER — NICOTINE 14 MG/24HR TD PT24: MEDICATED_PATCH | TRANSDERMAL | 0 refills | Status: DC

## 2017-03-31 MED ORDER — FUROSEMIDE 80 MG PO TABS: 80.0000 mg | ORAL_TABLET | Freq: Two times a day (BID) | ORAL | 0 refills | Status: DC

## 2017-03-31 MED ORDER — METHOCARBAMOL 500 MG PO TABS: ORAL_TABLET | ORAL | 2 refills | Status: DC

## 2017-03-31 MED ORDER — INSULIN GLARGINE 100 UNIT/ML SOLOSTAR PEN: 15.0000 [IU] | PEN_INJECTOR | Freq: Every day | SUBCUTANEOUS | 11 refills | Status: DC

## 2017-03-31 MED ORDER — FERROUS SULFATE 325 (65 FE) MG PO TABS
325.0000 mg | ORAL_TABLET | Freq: Two times a day (BID) | ORAL | 3 refills | Status: DC
Start: 2017-03-31 — End: 2018-07-02

## 2017-03-31 MED ORDER — HYDRALAZINE HCL 50 MG PO TABS
75.0000 mg | ORAL_TABLET | Freq: Four times a day (QID) | ORAL | Status: DC
  Administered 2017-03-31 – 2017-04-01 (×4): 75 mg via ORAL
  Filled 2017-03-31 (×4): qty 1

## 2017-03-31 MED ORDER — HYDRALAZINE HCL 25 MG PO TABS: 75.0000 mg | ORAL_TABLET | Freq: Four times a day (QID) | ORAL | 0 refills | Status: DC

## 2017-03-31 MED ORDER — POLYETHYLENE GLYCOL 3350 17 G PO PACK: 17.0000 g | PACK | Freq: Two times a day (BID) | ORAL | 0 refills | Status: DC

## 2017-03-31 MED ORDER — CALCITRIOL 0.25 MCG PO CAPS: 0.2500 ug | ORAL_CAPSULE | ORAL | 0 refills | Status: DC

## 2017-03-31 MED ORDER — SERTRALINE HCL 100 MG PO TABS: 100.0000 mg | ORAL_TABLET | Freq: Every day | ORAL | 6 refills | Status: DC

## 2017-03-31 MED ORDER — METOPROLOL SUCCINATE ER 50 MG PO TB24: 50.0000 mg | ORAL_TABLET | Freq: Two times a day (BID) | ORAL | 0 refills | Status: DC

## 2017-03-31 NOTE — Progress Notes (Signed)
[name], female   DOB: August 11, 1955, 61 y.o.   MRN: [id number] Patient is seen in follow-up for left above-the-knee amputation. Examination patient has some resolving hematoma that is draining from the residual limb. There is no abscess no cellulitis no tenderness to palpation no signs of infection. Patient may resume wearing her stump shrinker with 4 x 4 gauze over the area of drainage.

## 2017-03-31 NOTE — Progress Notes (Signed)
Subjective/Complaints: Pt seen laying bed this AM.  Overnight, pt had issues with drainage we well.    Review of systems: Denies chest pain, shortness breath, nausea, vomiting, diarrhea.  Objective: Vital Signs: Blood pressure (!) 148/64, pulse 84, temperature 98.6 F (37 C), temperature source Oral, resp. rate 20, height _0  (1.702 m), weight 73.6 kg (162 lb 4.1 oz), last menstrual period 03/16/2002, SpO2 99 %. No results found. Results for orders placed or performed during the hospital encounter of 03/26/17 (from the past 72 hour(s))  Glucose, capillary     Status: None   Collection Time: 03/28/17 12:03 PM  Result Value Ref Range   Glucose-Capillary 79 65 - 99 mg/dL  Occult blood card to lab, stool     Status: None   Collection Time: 03/28/17  2:14 PM  Result Value Ref Range   Fecal Occult Bld NEGATIVE NEGATIVE  Glucose, capillary     Status: None   Collection Time: 03/28/17  4:42 PM  Result Value Ref Range   Glucose-Capillary 69 65 - 99 mg/dL  Glucose, capillary     Status: Abnormal   Collection Time: 03/28/17  9:10 PM  Result Value Ref Range   Glucose-Capillary 105 (H) 65 - 99 mg/dL  Basic metabolic panel     Status: Abnormal   Collection Time: 03/29/17  4:23 AM  Result Value Ref Range   Sodium 136 135 - 145 mmol/L   Potassium 4.0 3.5 - 5.1 mmol/L   Chloride 105 101 - 111 mmol/L   CO2 24 22 - 32 mmol/L   Glucose, Bld 62 (L) 65 - 99 mg/dL   BUN 60 (H) 6 - 20 mg/dL   Creatinine, Ser 2.98 (H) 0.44 - 1.00 mg/dL   Calcium 8.3 (L) 8.9 - 10.3 mg/dL   GFR calc non Af Amer 16 (L) >60 mL/min   GFR calc Af Amer 18 (L) >60 mL/min    Comment: (NOTE) The eGFR has been calculated using the CKD EPI equation. This calculation has not been validated in all clinical situations. eGFR's persistently <60 mL/min signify possible Chronic Kidney Disease.    Anion gap 7 5 - 15  Glucose, capillary     Status: None   Collection Time: 03/29/17  6:31 AM  Result Value Ref Range    Glucose-Capillary 66 65 - 99 mg/dL  Glucose, capillary     Status: Abnormal   Collection Time: 03/29/17  6:49 AM  Result Value Ref Range   Glucose-Capillary 63 (L) 65 - 99 mg/dL  Occult blood card to lab, stool     Status: None   Collection Time: 03/29/17  9:32 AM  Result Value Ref Range   Fecal Occult Bld NEGATIVE NEGATIVE  Glucose, capillary     Status: None   Collection Time: 03/29/17 12:12 PM  Result Value Ref Range   Glucose-Capillary 97 65 - 99 mg/dL  Glucose, capillary     Status: Abnormal   Collection Time: 03/29/17  4:51 PM  Result Value Ref Range   Glucose-Capillary 124 (H) 65 - 99 mg/dL  Glucose, capillary     Status: Abnormal   Collection Time: 03/29/17  8:35 PM  Result Value Ref Range   Glucose-Capillary 130 (H) 65 - 99 mg/dL  CBC     Status: Abnormal   Collection Time: 03/30/17  4:09 AM  Result Value Ref Range   WBC 7.7 4.0 - 10.5 K/uL   RBC 2.89 (L) 3.87 - 5.11 MIL/uL   Hemoglobin 8.0 (L) 12.0 -  15.0 g/dL   HCT 25.9 (L) 36.0 - 46.0 %   MCV 89.6 78.0 - 100.0 fL   MCH 27.7 26.0 - 34.0 pg   MCHC 30.9 30.0 - 36.0 g/dL   RDW 16.1 (H) 11.5 - 15.5 %   Platelets 341 150 - 400 K/uL  Glucose, capillary     Status: None   Collection Time: 03/30/17  6:15 AM  Result Value Ref Range   Glucose-Capillary 96 65 - 99 mg/dL  Glucose, capillary     Status: None   Collection Time: 03/30/17 11:45 AM  Result Value Ref Range   Glucose-Capillary 87 65 - 99 mg/dL  Glucose, capillary     Status: None   Collection Time: 03/30/17  4:47 PM  Result Value Ref Range   Glucose-Capillary 86 65 - 99 mg/dL   Comment 1 Notify RN   Glucose, capillary     Status: None   Collection Time: 03/30/17  9:02 PM  Result Value Ref Range   Glucose-Capillary 96 65 - 99 mg/dL  Basic metabolic panel     Status: Abnormal   Collection Time: 03/31/17  4:44 AM  Result Value Ref Range   Sodium 137 135 - 145 mmol/L   Potassium 3.8 3.5 - 5.1 mmol/L   Chloride 106 101 - 111 mmol/L   CO2 23 22 - 32 mmol/L    Glucose, Bld 85 65 - 99 mg/dL   BUN 68 (H) 6 - 20 mg/dL   Creatinine, Ser 3.00 (H) 0.44 - 1.00 mg/dL   Calcium 8.4 (L) 8.9 - 10.3 mg/dL   GFR calc non Af Amer 16 (L) >60 mL/min   GFR calc Af Amer 18 (L) >60 mL/min    Comment: (NOTE) The eGFR has been calculated using the CKD EPI equation. This calculation has not been validated in all clinical situations. eGFR's persistently <60 mL/min signify possible Chronic Kidney Disease.    Anion gap 8 5 - 15  Glucose, capillary     Status: None   Collection Time: 03/31/17  6:08 AM  Result Value Ref Range   Glucose-Capillary 77 65 - 99 mg/dL    Gen: NAD. Vital signs reviewed HEENT: Normocephalic, atraumatic.  Cardio: RRR. No JVD. Resp: CTA b/l. Unlabored GI: BS positive and ND Musc/Skel:  Swelling and tenderness left AKA and L Flank Neuro: Alert/Oriented  Motor: B/l UE, RLE: 4+/5.  LLE: HF 3/5 (pain inhibition) Skin: Active sanguinous drainage from AKA  Assessment/Plan: 1. Functional deficits secondary to Left AKA which require 3+ hours per day of interdisciplinary therapy in a comprehensive inpatient rehab setting. Physiatrist is providing close team supervision and 24 hour management of active medical problems listed below. Physiatrist and rehab team continue to assess barriers to discharge/monitor patient progress toward functional and medical goals. FIM: Function - Bathing Position: Wheelchair/chair at sink Body parts bathed by patient: Right arm, Left arm, Chest, Abdomen, Front perineal area, Buttocks, Right upper leg, Left upper leg, Right lower leg Body parts bathed by helper: Back Bathing not applicable: Left lower leg Assist Level: Supervision or verbal cues, Set up  Function- Upper Body Dressing/Undressing What is the patient wearing?: Pull over shirt/dress, Bra Bra - Perfomed by patient: Thread/unthread right bra strap, Thread/unthread left bra strap, Hook/unhook bra (pull down sports bra) Pull over shirt/dress -  Perfomed by patient: Thread/unthread right sleeve, Thread/unthread left sleeve, Put head through opening, Pull shirt over trunk Assist Level: Set up Set up : To obtain clothing/put away Function - Lower Body Dressing/Undressing  What is the patient wearing?: Pants, Non-skid slipper socks, Underwear Position: Wheelchair/chair at sink Underwear - Performed by patient: Thread/unthread right underwear leg, Thread/unthread left underwear leg, Pull underwear up/down Pants- Performed by patient: Thread/unthread right pants leg, Thread/unthread left pants leg, Pull pants up/down Non-skid slipper socks- Performed by patient: Don/doff right sock Non-skid slipper socks- Performed by helper: Don/doff right sock Assist for footwear: Partial/moderate assist (assist to don shrinker) Assist for lower body dressing: Touching or steadying assistance (Pt > 75%)  Function - Toileting Toileting steps completed by patient: Adjust clothing prior to toileting, Performs perineal hygiene, Adjust clothing after toileting Toileting steps completed by helper: Adjust clothing prior to toileting, Performs perineal hygiene, Adjust clothing after toileting Toileting Assistive Devices: Grab bar or rail Assist level: Supervision or verbal cues  Function - Air cabin crew transfer assistive device: Elevated toilet seat/BSC over toilet, Grab bar Assist level to toilet: Supervision or verbal cues Assist level from toilet: Supervision or verbal cues Assist level to bedside commode (at bedside): Touching or steadying assistance (Pt > 75%) Assist level from bedside commode (at bedside): Touching or steadying assistance (Pt > 75%)  Function - Chair/bed transfer Chair/bed transfer method: Stand pivot Chair/bed transfer assist level: Touching or steadying assistance (Pt > 75%) Chair/bed transfer assistive device: Bedrails, Armrests Chair/bed transfer details: Manual facilitation for placement, Manual facilitation for  weight shifting  Function - Locomotion: Wheelchair Will patient use wheelchair at discharge?: Yes Type: Manual Max wheelchair distance: 200 Assist Level: Supervision or verbal cues Assist Level: Supervision or verbal cues Assist Level: Supervision or verbal cues Turns around,maneuvers to table,bed, and toilet,negotiates 3% grade,maneuvers on rugs and over doorsills: Yes Function - Locomotion: Ambulation Assistive device: Walker-rolling Max distance: 40 Assist level: Touching or steadying assistance (Pt > 75%) Assist level: Touching or steadying assistance (Pt > 75%) Walk 10 feet on uneven surfaces activity did not occur: Safety/medical concerns  Function - Comprehension Comprehension: Auditory Comprehension assist level: Follows complex conversation/direction with no assist  Function - Expression Expression: Verbal Expression assist level: Expresses complex ideas: With no assist  Function - Social Interaction Social Interaction assist level: Interacts appropriately with others with medication or extra time (anti-anxiety, antidepressant).  Function - Problem Solving Problem solving assist level: Solves complex problems: Recognizes & self-corrects  Function - Memory Memory assist level: Complete Independence: No helper Patient normally able to recall (first 3 days only): Current season, Location of own room, Staff names and faces, That he or she is in a hospital  Medical Problem List and Plan:  1. 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 DVT Prophylaxis/Anticoagulation: Pharmaceutical: Lovenox  3. Pain Management: continue oxycodone and robaxin prn. Monitor For SE.  4. Mood: Team to provide ego support. LCSW to follow for evaluation and support.   Continue zoloft for depression  5. Neuropsych: This patient is capable of making decisions on her own behalf.  6. Skin/Wound Care: Monitor wound daily for  healing   -Active drainage from stump, evaluated by Ortho, recommendations to cont dressings/stump shrinker 7. Fluids/Electrolytes/Nutrition: Monitor I/O.  8. HTN: Monitor BP bid--on Lasix, Metoprolol and Norvasc.   Hydralazine changed to 41m TID on 10/15, increased 745m4/day on 10/16 10 T2DM: Monitor BS ac/hs. Continue lantus with breakfast.   Snack HS   Overall controlled 10/16 11. CKD stage V: Continue to monitor renal status every other day  Cr. 3.00 on 10/16  Encourage fluids  Cont to  monitor 12. Anemia of chronic disease: Had drop in H/H to 7.2/22.8 requiring another unit of PRBC. -no new signs of bleeding   Hb 8.0 on 10/15  CTabd/pelvis with DDD and vertebral fracture, but negative for flank or retroperitoneal hematoma  Negative stool OB  Cont to monitor  LOS (Days) 5 A FACE TO FACE EVALUATION WAS PERFORMED  Ankit Lorie Phenix 03/31/2017, 8:46 AM

## 2017-03-31 NOTE — Progress Notes (Signed)
Physical Therapy Discharge Summary  Patient Details  Name: Brandy Houston MRN: [id number] Date of Birth: 04/30/1956  Today's Date: 03/31/2017 PT Individual Time: 1000-1100 PT Individual Time Calculation (min): 60 min   Pt agreeable to session and able to self manage residual limb pain with rubbing and tapping.  Gait 50' x 2 with son present to provide supervision level assist. Pt/son demo'd son bumping pt up step in w/c, both state they feel comfortable with this level of care.  Pt performed simulated SUV transfer with supervision with use of running board and grab bar.  Supine and sidelying strengthening and ROM exercises with pt still having mm cramping with hip in neutral.  Pt and son state they are ready for d/c home tomorrow.  Patient has met 8 of 8 long term goals due to improved activity tolerance, improved balance, improved postural control, increased strength, decreased pain and ability to compensate for deficits.  Patient to discharge at a wheelchair level Modified Independent.  Supervision for gait. Patient's care partner is independent to provide the necessary physical assistance at discharge for stair and car transfers.  Reasons goals not met: n/a  Recommendation:  Patient will benefit from ongoing skilled PT services in home health setting to continue to advance safe functional mobility, address ongoing impairments in balance, ROM, strength, and minimize fall risk.  Equipment: No equipment provided, pt owns RW and w/c  Reasons for discharge: treatment goals met and discharge from hospital  Patient/family agrees with progress made and goals achieved: Yes  PT Discharge Precautions/Restrictions Precautions Precautions: Fall Restrictions LLE Weight Bearing: Non weight bearing Pain Pain Assessment Faces Pain Scale: Hurts little more Pain Type: Acute pain;Surgical pain Pain Location: Leg Pain Orientation: Left Pain Descriptors / Indicators: Aching;Grimacing Pain Onset:  On-going Pain Intervention(s): RN made aware;Repositioned   Cognition Overall Cognitive Status: Within Functional Limits for tasks assessed Arousal/Alertness: Awake/alert Sensation Sensation Light Touch Impaired Details: Impaired LLE (hypersensitive) Proprioception: Appears Intact Coordination Gross Motor Movements are Fluid and Coordinated: Yes Fine Motor Movements are Fluid and Coordinated: Yes Motor  Motor Motor - Discharge Observations: improving strength   Trunk/Postural Assessment  Cervical Assessment Cervical Assessment: Within Functional Limits Thoracic Assessment Thoracic Assessment: Within Functional Limits Lumbar Assessment Lumbar Assessment:  (posterior pelvic tilt) Postural Control Righting Reactions: delayed  Balance Dynamic Sitting Balance Sitting balance - Comments: mod I to gather items off the floor from w/c level Static Standing Balance Static Standing - Comment/# of Minutes: mod I with RW Dynamic Standing Balance Dynamic Standing - Comments: mod I with RW Extremity Assessment      RLE Assessment RLE Assessment:  (grossly 4/5) LLE Assessment LLE Assessment:  (hip extension to neutral only limited by pain, grossly 3-/5)   See Function Navigator for Current Functional Status.  DONAWERTH,KAREN 03/31/2017, 1:37 PM

## 2017-03-31 NOTE — Progress Notes (Signed)
Occupational Therapy Discharge Summary  Patient Details  Name: COURTENY EGLER MRN: [id number] Date of Birth: 07/10/1955  Patient has met 8 of 8 long term goals due to improved balance, postural control and ability to compensate for deficits.  Patient to discharge at overall Modified Independent level.  Patient's care partner is independent to provide the necessary intermittent assistance at discharge.    Reasons goals not met: N/A  Recommendation:  Patient will benefit from ongoing skilled OT services in home health setting to continue to advance functional skills in the area of BADL and Reduce care partner burden.  Equipment: No equipment provided  Reasons for discharge: treatment goals met and discharge from hospital  Patient/family agrees with progress made and goals achieved: Yes  OT Discharge Precautions/Restrictions  Precautions Precautions: Fall Restrictions LLE Weight Bearing: Non weight bearing Pain Pain Assessment Faces Pain Scale: Hurts little more Pain Type: Acute pain;Surgical pain Pain Location: Leg Pain Orientation: Left Pain Descriptors / Indicators: Aching;Grimacing Pain Onset: On-going Pain Intervention(s): RN made aware;Repositioned ADL  See Function Navigator Vision Baseline Vision/History: Wears glasses Wears Glasses: Reading only Patient Visual Report: No change from baseline Vision Assessment?: No apparent visual deficits Perception  Perception: Within Functional Limits Praxis Praxis: Intact Cognition Overall Cognitive Status: Within Functional Limits for tasks assessed Arousal/Alertness: Awake/alert Orientation Level: Oriented to person;Oriented to place;Oriented to time;Oriented to situation Attention: Alternating Alternating Attention: Appears intact Awareness: Appears intact Problem Solving: Appears intact Safety/Judgment: Appears intact Sensation Sensation Light Touch: Impaired Detail Light Touch Impaired Details: Impaired LLE  (hypersensitive) Hot/Cold: Appears Intact Proprioception: Appears Intact Coordination Gross Motor Movements are Fluid and Coordinated: Yes Fine Motor Movements are Fluid and Coordinated: Yes Motor  Motor Motor - Discharge Observations: improving strength  Trunk/Postural Assessment  Cervical Assessment Cervical Assessment: Within Functional Limits Thoracic Assessment Thoracic Assessment: Within Functional Limits Lumbar Assessment Lumbar Assessment:  (posterior pelvic tilt) Postural Control Righting Reactions: delayed  Balance Dynamic Sitting Balance Sitting balance - Comments: mod I to gather items off the floor from w/c level Static Standing Balance Static Standing - Comment/# of Minutes: mod I with RW Dynamic Standing Balance Dynamic Standing - Comments: mod I with RW Extremity/Trunk Assessment RUE Assessment RUE Assessment: Within Functional Limits LUE Assessment LUE Assessment: Within Functional Limits   See Function Navigator for Current Functional Status.  Simonne Come 03/31/2017, 2:59 PM

## 2017-03-31 NOTE — Progress Notes (Signed)
Occupational Therapy Session Note  Patient Details  Name: Brandy Houston MRN: [id number] Date of Birth: [birth year]  Today's Date: 03/31/2017 OT Individual Time: 1130-1200 OT Individual Time Calculation (min): 30 min    Short Term Goals: Week 1:  OT Short Term Goal 1 (Week 1): STG = LTGs due to ELOS  Skilled Therapeutic Interventions/Progress Updates:    1:1 Performed simulated shower stall transfers with seat (as she has at home verus bench). Pt liked the tub bench over the ledge of shower stall and able to perform transfer with RW with supervision. Pt performed bed mobility in regular height bed in ADL apartment with mod I . Pt also perform simple meal pre including gathering different items and safety transporting items with supervision from wc level. Pt able to ambulate ~ 25 feet with RW with close supervision before fatigue. Left in w/c to rest post session with call bell.   Therapy Documentation Precautions:  Precautions Precautions: Fall Restrictions Weight Bearing Restrictions: Yes LLE Weight Bearing: Non weight bearing Pain: No reports of pain  Other Treatments:    See Function Navigator for Current Functional Status.   Therapy/Group: Individual Therapy  Willeen Cass [facility] 03/31/2017, 3:31 PM

## 2017-03-31 NOTE — Progress Notes (Signed)
Physical Therapy Session Note  Patient Details  Name: Brandy Houston MRN: [id number] Date of Birth: 1956/04/10  Today's Date: 03/31/2017 PT Individual Time: 2952-8413 PT Individual Time Calculation (min): 45 min   Short Term Goals: Week 1:  PT Short Term Goal 1 (Week 1): STG=LTG  Skilled Therapeutic Interventions/Progress Updates:    Pt seated in w/c upon PT arrival, agreeable to therapy tx and reports pain 5/10 in L LE. Pt propelled w/c from room <>gym 150 ft each way mod I. Pt transferred w/c<>mat, squat pivot transfer mod I. Pt transferred sitting>supine>prone in order to work on hip ROM and stretch hip flexors. Pt prone on elbows with pillow under the hips to minimize pain held 2 x 30 sec. Pt laying in supine to allow L LE to relax down to the mat, increasing hip extension while performing glute sets x 10. Pt in supine performed 2 x 10 L hip abduction with emphasis on maintaining hip extension throughout movement. Therapist performed manual hip flexor stretch 2 x 30 sec. Pt left seated in w/c at end of session with needs in reach.  Therapy Documentation Precautions:  Precautions Precautions: Fall Restrictions Weight Bearing Restrictions: Yes LLE Weight Bearing: Non weight bearing  See Function Navigator for Current Functional Status.   Therapy/Group: Individual Therapy  Netta Corrigan, PT, DPT 03/31/2017, 3:34 PM

## 2017-03-31 NOTE — Progress Notes (Signed)
Pressure dressing remains in place to left stump.  Dressing continues to be clean and dry on assessment.  Continued to encourage patient to elevate stump on pillow.    Fredna Dow M

## 2017-03-31 NOTE — Discharge Instructions (Signed)
Inpatient Rehab Discharge Instructions  Brandy Houston Discharge date and time:    Activities/Precautions/ Functional Status: Activity: activity as tolerated Diet: diabetic diet Wound Care:   Functional status:  ___ No restrictions     ___ Walk up steps independently ___ 24/7 supervision/assistance   ___ Walk up steps with assistance ___ Intermittent supervision/assistance  ___ Bathe/dress independently ___ Walk with walker     _x__ Bathe/dress with assistance ___ Walk Independently    ___ Shower independently ___ Walk with assistance    ___ Shower with assistance ___ No alcohol     ___ Return to work/school ________   COMMUNITY REFERRALS UPON DISCHARGE:    Home Health:   PT       RN                        Agency: [facility]    Phone: [phone]    GENERAL COMMUNITY RESOURCES FOR PATIENT/FAMILY:  Support Groups: Amputee Support Group       Special Instructions: Stump shrinker with 4 x 4 to amputation sites changed daily as needed   My questions have been answered and I understand these instructions. I will adhere to these goals and the provided educational materials after my discharge from the hospital.  Patient/Caregiver Signature _______________________________ Date __________  Clinician Signature _______________________________________ Date __________  Please bring this form and your medication list with you to all your follow-up doctor's appointments.

## 2017-03-31 NOTE — Progress Notes (Signed)
Occupational Therapy Session Note  Patient Details  Name: Brandy Houston MRN: [id number] Date of Birth: 15-Sep-1955  Today's Date: 03/31/2017 OT Individual Time: 0802-0859 OT Individual Time Calculation (min): 57 min    Short Term Goals: Week 1:  OT Short Term Goal 1 (Week 1): STG = LTGs due to ELOS  Skilled Therapeutic Interventions/Progress Updates:    Completed ADL retraining at overall Mod I level.  Supervision for bathing with cues for attention to LLE and to utilize inspection mirror to assess healing.  Pt gathered all clothing and supplies prior to bathing from w/c level.  Sit > stand for bathing and toileting tasks at Mod I this session.  Pt reports ready for d/c home.  Son to be present for family education with PT.  RN present at end of session to assist with applying dressing to residual limb.   Therapy Documentation Precautions:  Precautions Precautions: Fall Restrictions Weight Bearing Restrictions: Yes LLE Weight Bearing: Non weight bearing General:   Vital Signs: Therapy Vitals Temp: 98.6 F (37 C) Temp Source: Oral Pulse Rate: 84 Resp: 20 BP: (!) 148/64 Patient Position (if appropriate): Lying Oxygen Therapy SpO2: 99 % O2 Device: Not Delivered Pain:  Pt with no c/o pain  See Function Navigator for Current Functional Status.   Therapy/Group: Individual Therapy  Simonne Come 03/31/2017, 9:36 AM

## 2017-04-01 ENCOUNTER — Inpatient Hospital Stay (HOSPITAL_COMMUNITY): Payer: Medicare Other

## 2017-04-01 ENCOUNTER — Encounter (HOSPITAL_COMMUNITY): Payer: Self-pay | Admitting: Interventional Radiology

## 2017-04-01 DIAGNOSIS — R52 Pain, unspecified: Secondary | ICD-10-CM

## 2017-04-01 HISTORY — PX: IR REMOVAL TUN CV CATH W/O FL: IMG2289

## 2017-04-01 LAB — GLUCOSE, CAPILLARY
Glucose-Capillary: 93 mg/dL (ref 65–99)
Glucose-Capillary: 108 mg/dL — ABNORMAL HIGH (ref 65–99)

## 2017-04-01 MED ORDER — LIDOCAINE HCL 1 % IJ SOLN
INTRAMUSCULAR | Status: AC
  Filled 2017-04-01: qty 20

## 2017-04-01 NOTE — Progress Notes (Signed)
Social Work  Discharge Note  The overall goal for the admission was met for:   Discharge location: Yes - home with son and his family  Length of Stay: Yes - 6 days  Discharge activity level: Yes - modified independent w/c  Home/community participation: Yes  Services provided included: MD, RD, PT, OT, RN, TR, Pharmacy and SW  Financial Services: Medicare and Medicaid  Follow-up services arranged: Home Health: RN, PT via [facility] and Patient/Family request agency HH: [facility], DME: NA - had all needed DME  Comments (or additional information):  Patient/Family verbalized understanding of follow-up arrangements: Yes  Individual responsible for coordination of the follow-up plan: pt  Confirmed correct DME delivered: NA    ,  

## 2017-04-01 NOTE — Patient Care Conference (Signed)
Inpatient RehabilitationTeam Conference and Plan of Care Update Date: 04/01/2017   Time: 1:46 PM    Patient Name: Brandy Houston      Medical Record Number: [id number]  Date of Birth: [birth year] Sex: Female         Room/Bed: 4W01C/4W01C-01 Payor Info: Payor: MEDICARE / Plan: MEDICARE PART A AND B / Product Type: *No Product type* /    Admitting Diagnosis: AKA  Admit Date/Time:  03/26/2017  6:49 PM Admission Comments: No comment available   Primary Diagnosis:  <principal problem not specified> Principal Problem: <principal problem not specified>  Patient Active Problem List   Diagnosis Date Noted  . Pain   . Benign essential HTN   . Diabetes mellitus type 2 in obese ([place])   . Stage 5 chronic kidney disease not on chronic dialysis ([place])   . Unilateral AKA, left ([place]) 03/26/2017  . Above knee amputation status, left ([place])   . Acute blood loss anemia   . Chronic diastolic heart failure ([place])   . Post-operative pain   . Leukocytosis   . DM type 2 causing CKD stage 5 ([place]) 03/21/2017  . CKD (chronic kidney disease), stage V ([place]) 03/17/2017  . Wound dehiscence 03/10/2017  . Chronic pain of left knee 03/05/2017  . Fever 02/08/2017  . Infection of prosthetic left knee joint ([place]) 02/07/2017  . AKI (acute kidney injury) ([place])   . Hemarthrosis   . Anemia of chronic disease 02/05/2017  . Diabetes mellitus, type II ([place]) 02/05/2017  . Dysplasia of cervix, high grade CIN 2 01/12/2017  . ASCUS with positive high risk HPV cervical 12/09/2016  . Chronic diastolic CHF (congestive heart failure) ([place]) 11/08/2015  . Total knee replacement status   . Diabetic peripheral neuropathy ([place])   . Anxiety state   . Depression   . Normocytic anemia 11/07/2015  . Chronic kidney disease (CKD), stage IV (severe) ([place])   . Hepatitis C 08/16/2014  . Peripheral neuropathy 06/24/2013  . 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 Essential hypertension 02/18/2013    Expected Discharge Date:  Expected Discharge Date: 04/01/17  Team Members Present: Physician leading conference: Dr. Delice Lesch Social Worker Present: Lennart Pall, LCSW Nurse Present: Other (comment) Etheleen Nicks, RN) PT Present: Roderic Ovens, PT OT Present: Simonne Come, OT     Current Status/Progress Goal Weekly Team Focus  Medical   Functional deficits secondary to left above knee amputation   Improve mobility, transfers, safety, HTN  See above   Bowel/Bladder   continent of b/b, LBM 10/16  continent of b/b with min assist  monitor b/b q shift and prn   Swallow/Nutrition/ Hydration             ADL's   Mod I overall, supervision shower transfers  Mod I overall, supervision shower transfers  d/c planning, residual limb care   Mobility   mod I w/c and transfers, supervision gait  mod I w/c and transfers, supervision gait  d/c planning   Communication             Safety/Cognition/ Behavioral Observations            Pain   pt with c/o severe pain to left AKA, relieved with ATC administration of oxycodone and Robaxin  pain </= 4, mod I A  monitor pain and administer meds as needed to keep pain at or below goal.   Skin   left stump incision with sutures and stump shrinker, hematoma at incision occasional oozing, gauzed  dressing change daily and prn.  no breakdown while on IPR, dressing change with mod assist  monitor skin q shift and prn, wound care per md order.    Rehab Goals Patient on target to meet rehab goals: Yes *See Care Plan and progress notes for long and short-term goals.     Barriers to Discharge  Current Status/Progress Possible Resolutions Date Resolved   Physician    Medical stability;Weight     See above  Therapies, follow labs, making good progress - d/c todau      Nursing                  PT                    OT                  SLP                SW                Discharge Planning/Teaching Needs:  Pt to d/c home son and his family who can provide very close to  24/7 support.  teaching completed today with son   Team Discussion:  Pt has met supervision to mod independent goals.  Fam ed complete and ready for d/c today with HH follow up  Revisions to Treatment Plan:  None    Continued Need for Acute Rehabilitation Level of Care: The patient requires daily medical management by a physician with specialized training in physical medicine and rehabilitation for the following conditions: Daily direction of a multidisciplinary physical rehabilitation program to ensure safe treatment while eliciting the highest outcome that is of practical value to the patient.: Yes Daily medical management of patient stability for increased activity during participation in an intensive rehabilitation regime.: Yes Daily analysis of laboratory values and/or radiology reports with any subsequent need for medication adjustment of medical intervention for : Post surgical problems;Diabetes problems;Blood pressure problems;Wound care problems;Renal problems  Shaynah Hund 04/01/2017, 1:46 PM

## 2017-04-01 NOTE — Procedures (Signed)
PROCEDURE SUMMARY:  Successful removal of tunneled central venous catheter.  Patient tolerated well.  See full dictation in Imaging for details.  Malayshia All S Saliou Barnier PA-C 04/01/2017 10:53 AM

## 2017-04-01 NOTE — Progress Notes (Signed)
Pt d/c home with personal belongings. Son with pt and was provided d/c instructions by Marlowe Shores PA, both verbalized understanding. Tunneled IJ removed by IR, provided instructions to pt to keep drsg on for 24 hours and then remove and cover with band aid daily until healed. It sees bleeding lie flat and apply pressure for 10 min. If continues call PCP.

## 2017-04-01 NOTE — Progress Notes (Signed)
Subjective/Complaints: Patient seen lying in bed this morning. She states she slept well. She is ready for discharge. She has questions regarding follow-up appointments.  Review of systems: Denies chest pain, shortness breath, nausea, vomiting, diarrhea.  Objective: Vital Signs: Blood pressure 138/61, pulse 64, temperature 98.7 F (37.1 C), temperature source Oral, resp. rate 16, height _0  (1.702 m), weight 74.1 kg (163 lb 7.2 oz), last menstrual period 03/16/2002, SpO2 96 %. Ir Removal Tun Cv Cath W/o Fl  Result Date: 04/01/2017 INDICATION: Antibiotics completed. Request for removal of tunneled central venous catheter. Initially placed by Dr. Annamaria Boots on 02/10/2017. Worked well with no signs of infection. EXAM: REMOVAL OF TUNNELED CENTRAL VENOUS CATHETER MEDICATIONS: 1% Lidocaine = 2 mL ANESTHESIA/SEDATION: No sedation medication given. COMPLICATIONS: None immediate. PROCEDURE: Informed written consent was obtained from the patient following an explanation of the procedure, risks, benefits and alternatives to treatment. A time out was performed prior to the initiation of the procedure. Maximal barrier sterile technique was utilized including mask, gloves, hand hygiene, and Betadine. 1% lidocaine was injected under sterile conditions along the subcutaneous tunnel. Utilizing a combination of blunt dissection and gentle traction, the catheter was removed intact. Hemostasis was obtained with manual compression. A dressing was placed. The patient tolerated the procedure well without immediate post procedural complication. IMPRESSION: Successful removal of tunneled central venous catheter. Read by:  Gareth Eagle, PA-C Electronically Signed   By: Aletta Edouard M.D.   On: 04/01/2017 10:51   Results for orders placed or performed during the hospital encounter of 03/26/17 (from the past 72 hour(s))  Glucose, capillary     Status: Abnormal   Collection Time: 03/29/17  4:51 PM  Result Value Ref Range    Glucose-Capillary 124 (H) 65 - 99 mg/dL  Glucose, capillary     Status: Abnormal   Collection Time: 03/29/17  8:35 PM  Result Value Ref Range   Glucose-Capillary 130 (H) 65 - 99 mg/dL  CBC     Status: Abnormal   Collection Time: 03/30/17  4:09 AM  Result Value Ref Range   WBC 7.7 4.0 - 10.5 K/uL   RBC 2.89 (L) 3.87 - 5.11 MIL/uL   Hemoglobin 8.0 (L) 12.0 - 15.0 g/dL   HCT 25.9 (L) 36.0 - 46.0 %   MCV 89.6 78.0 - 100.0 fL   MCH 27.7 26.0 - 34.0 pg   MCHC 30.9 30.0 - 36.0 g/dL   RDW 16.1 (H) 11.5 - 15.5 %   Platelets 341 150 - 400 K/uL  Glucose, capillary     Status: None   Collection Time: 03/30/17  6:15 AM  Result Value Ref Range   Glucose-Capillary 96 65 - 99 mg/dL  Glucose, capillary     Status: None   Collection Time: 03/30/17 11:45 AM  Result Value Ref Range   Glucose-Capillary 87 65 - 99 mg/dL  Glucose, capillary     Status: None   Collection Time: 03/30/17  4:47 PM  Result Value Ref Range   Glucose-Capillary 86 65 - 99 mg/dL   Comment 1 Notify RN   Glucose, capillary     Status: None   Collection Time: 03/30/17  9:02 PM  Result Value Ref Range   Glucose-Capillary 96 65 - 99 mg/dL  Basic metabolic panel     Status: Abnormal   Collection Time: 03/31/17  4:44 AM  Result Value Ref Range   Sodium 137 135 - 145 mmol/L   Potassium 3.8 3.5 - 5.1 mmol/L   Chloride 106  101 - 111 mmol/L   CO2 23 22 - 32 mmol/L   Glucose, Bld 85 65 - 99 mg/dL   BUN 68 (H) 6 - 20 mg/dL   Creatinine, Ser 3.00 (H) 0.44 - 1.00 mg/dL   Calcium 8.4 (L) 8.9 - 10.3 mg/dL   GFR calc non Af Amer 16 (L) >60 mL/min   GFR calc Af Amer 18 (L) >60 mL/min    Comment: (NOTE) The eGFR has been calculated using the CKD EPI equation. This calculation has not been validated in all clinical situations. eGFR's persistently <60 mL/min signify possible Chronic Kidney Disease.    Anion gap 8 5 - 15  Glucose, capillary     Status: None   Collection Time: 03/31/17  6:08 AM  Result Value Ref Range    Glucose-Capillary 77 65 - 99 mg/dL  Glucose, capillary     Status: None   Collection Time: 03/31/17 12:25 PM  Result Value Ref Range   Glucose-Capillary 81 65 - 99 mg/dL  Glucose, capillary     Status: Abnormal   Collection Time: 03/31/17  4:54 PM  Result Value Ref Range   Glucose-Capillary 131 (H) 65 - 99 mg/dL  Glucose, capillary     Status: Abnormal   Collection Time: 03/31/17  9:10 PM  Result Value Ref Range   Glucose-Capillary 148 (H) 65 - 99 mg/dL  Glucose, capillary     Status: None   Collection Time: 04/01/17  6:26 AM  Result Value Ref Range   Glucose-Capillary 93 65 - 99 mg/dL  Glucose, capillary     Status: Abnormal   Collection Time: 04/01/17 11:25 AM  Result Value Ref Range   Glucose-Capillary 108 (H) 65 - 99 mg/dL    Gen: NAD. Vital signs reviewed HEENT: Normocephalic, atraumatic.  Cardio: RRR. No JVD. Resp: CTA b/l. Unlabored GI: BS positive and ND Musc/Skel:  Swelling and tenderness left AKA  Neuro: Alert/Oriented  Motor: B/l UE, RLE: 4+/5.  LLE: HF 4/5 (pain inhibition) Skin: Sanguinous drainage from AKA  Assessment/Plan: 1. Functional deficits secondary to Left AKA which require 3+ hours per day of interdisciplinary therapy in a comprehensive inpatient rehab setting. Physiatrist is providing close team supervision and 24 hour management of active medical problems listed below. Physiatrist and rehab team continue to assess barriers to discharge/monitor patient progress toward functional and medical goals. FIM: Function - Bathing Position: Wheelchair/chair at sink Body parts bathed by patient: Right arm, Left arm, Chest, Abdomen, Front perineal area, Buttocks, Right upper leg, Left upper leg, Right lower leg, Back Body parts bathed by helper: Back Bathing not applicable: Left lower leg Assist Level: Supervision or verbal cues, Set up  Function- Upper Body Dressing/Undressing What is the patient wearing?: Pull over shirt/dress, Bra Bra - Perfomed by  patient: Thread/unthread right bra strap, Thread/unthread left bra strap, Hook/unhook bra (pull down sports bra) Pull over shirt/dress - Perfomed by patient: Thread/unthread right sleeve, Thread/unthread left sleeve, Put head through opening, Pull shirt over trunk Assist Level: More than reasonable time Set up : To obtain clothing/put away Function - Lower Body Dressing/Undressing What is the patient wearing?: Pants, Non-skid slipper socks, Underwear Position: Wheelchair/chair at sink Underwear - Performed by patient: Thread/unthread right underwear leg, Thread/unthread left underwear leg, Pull underwear up/down Pants- Performed by patient: Thread/unthread right pants leg, Thread/unthread left pants leg, Pull pants up/down Non-skid slipper socks- Performed by patient: Don/doff right sock Non-skid slipper socks- Performed by helper: Don/doff right sock Assist for footwear: Independent Assist for lower  body dressing: More than reasonable time  Function - Toileting Toileting steps completed by patient: Adjust clothing prior to toileting, Performs perineal hygiene, Adjust clothing after toileting Toileting steps completed by helper: Adjust clothing prior to toileting, Performs perineal hygiene, Adjust clothing after toileting Toileting Assistive Devices: Grab bar or rail Assist level: More than reasonable time  Function - Toilet Transfers Toilet transfer assistive device: Drop arm commode Assist level to toilet: No Help, no cues, assistive device, takes more than a reasonable amount of time Assist level from toilet: No Help, no cues, assistive device, takes more than a reasonable amount of time Assist level to bedside commode (at bedside): Set up only Assist level from bedside commode (at bedside): Set up only  Function - Chair/bed transfer Chair/bed transfer method: Stand pivot Chair/bed transfer assist level: No Help, no cues, assistive device, takes more than a reasonable amount of  time Chair/bed transfer assistive device: Bedrails, Armrests Chair/bed transfer details: Manual facilitation for placement, Manual facilitation for weight shifting  Function - Locomotion: Wheelchair Will patient use wheelchair at discharge?: Yes Type: Manual Max wheelchair distance: 500 Assist Level: No help, No cues, assistive device, takes more than reasonable amount of time Assist Level: No help, No cues, assistive device, takes more than reasonable amount of time Assist Level: No help, No cues, assistive device, takes more than reasonable amount of time Turns around,maneuvers to table,bed, and toilet,negotiates 3% grade,maneuvers on rugs and over doorsills: Yes Function - Locomotion: Ambulation Assistive device: Walker-rolling Max distance: 50 Assist level: Supervision or verbal cues Assist level: Supervision or verbal cues Assist level: Supervision or verbal cues Walk 150 feet activity did not occur: Safety/medical concerns Walk 10 feet on uneven surfaces activity did not occur: Safety/medical concerns  Function - Comprehension Comprehension: Auditory Comprehension assist level: Follows complex conversation/direction with no assist  Function - Expression Expression: Verbal Expression assist level: Expresses complex ideas: With no assist  Function - Social Interaction Social Interaction assist level: Interacts appropriately with others with medication or extra time (anti-anxiety, antidepressant).  Function - Problem Solving Problem solving assist level: Solves complex problems: Recognizes & self-corrects  Function - Memory Memory assist level: Complete Independence: No helper Patient normally able to recall (first 3 days only): Current season, Location of own room, Staff names and faces, That he or she is in a hospital  Medical Problem List and Plan:  1. Functional deficits secondary to left above knee amputation   D/c today  Will see patient for transitional care  management in 1-2 weeks 2. DVT Prophylaxis/Anticoagulation: Pharmaceutical: Lovenox  3. Pain Management: continue oxycodone and robaxin prn. Monitor For SE.  4. Mood: Team to provide ego support. LCSW to follow for evaluation and support.   Continue zoloft for depression  5. Neuropsych: This patient is capable of making decisions on her own behalf.  6. Skin/Wound Care: Monitor wound daily for healing   -Drainage from stump, evaluated by Ortho, recommendations to cont dressings/stump shrinker 7. Fluids/Electrolytes/Nutrition: Monitor I/O.  8. HTN: Monitor BP bid--on Lasix, Metoprolol and Norvasc.   Hydralazine changed to 50m TID on 10/15, increased 732m4/day on 10/16  Improved control on 10/17 10 T2DM: Monitor BS ac/hs. Continue lantus with breakfast.   Snack HS   Overall controlled 10/17 11. CKD stage V: Continue to monitor renal status every other day  Cr. 3.00 on 10/16  Encourage fluids  Cont to monitor 12. Anemia of chronic disease: Had drop in H/H to 7.2/22.8 requiring another unit of PRBC. -no new  signs of bleeding   Hb 8.0 on 10/15  CTabd/pelvis with DDD and vertebral fracture, but negative for flank or retroperitoneal hematoma  Negative stool OB  Cont to monitor  Will require ambulatory monitoring  LOS (Days) 6 A FACE TO FACE EVALUATION WAS PERFORMED  Ankit Lorie Phenix 04/01/2017, 1:03 PM

## 2017-04-02 ENCOUNTER — Ambulatory Visit (INDEPENDENT_AMBULATORY_CARE_PROVIDER_SITE_OTHER): Payer: Medicare Other | Admitting: Orthopaedic Surgery

## 2017-04-03 ENCOUNTER — Telehealth (INDEPENDENT_AMBULATORY_CARE_PROVIDER_SITE_OTHER): Payer: Self-pay | Admitting: Orthopaedic Surgery

## 2017-04-03 NOTE — Telephone Encounter (Signed)
Pt called in and wanted to get a letter for her sons job. She stated she is waiting for her home care nurse to come in. Her son would need a letter to stay out of work with her. Pt canceled her appt on 04/02/2017 @10 :45am. I noticed in the past and previous section the pt was not see in the office unless she is in the old system. Pt did mention doctor duda name as well as XU for pt care.

## 2017-04-06 ENCOUNTER — Telehealth (INDEPENDENT_AMBULATORY_CARE_PROVIDER_SITE_OTHER): Payer: Self-pay | Admitting: Radiology

## 2017-04-06 NOTE — Telephone Encounter (Signed)
Patient called Back and [name] advised on messages below and patient states she saw Dr [name]. Last office visit 03/10/17.

## 2017-04-06 NOTE — Telephone Encounter (Signed)
See message below °

## 2017-04-06 NOTE — Telephone Encounter (Signed)
Patient status post above knee amputation. Will write note for patients son. Needing son's name. Brandy Houston in emergency contacts. Called left voicemail for patient to confirm note since patient had multiple surgeries needing to know what dates and the name of her son. If she wants him to pick up the letter, do we need to fax to employer.

## 2017-04-06 NOTE — Telephone Encounter (Signed)
Please see message. °

## 2017-04-07 ENCOUNTER — Telehealth: Payer: Self-pay | Admitting: *Deleted

## 2017-04-07 NOTE — Telephone Encounter (Signed)
Ms Kley requested a call back in reference to appt. (she has one scheduled) I left message with appt info and address on her VM.

## 2017-04-07 NOTE — Telephone Encounter (Signed)
Letter generated for patients son and left at front desk for pick up per her request.

## 2017-04-08 DIAGNOSIS — T8781 Dehiscence of amputation stump: Secondary | ICD-10-CM | POA: Diagnosis not present

## 2017-04-08 DIAGNOSIS — E1142 Type 2 diabetes mellitus with diabetic polyneuropathy: Secondary | ICD-10-CM | POA: Diagnosis not present

## 2017-04-08 DIAGNOSIS — I132 Hypertensive heart and chronic kidney disease with heart failure and with stage 5 chronic kidney disease, or end stage renal disease: Secondary | ICD-10-CM | POA: Diagnosis not present

## 2017-04-08 DIAGNOSIS — I5032 Chronic diastolic (congestive) heart failure: Secondary | ICD-10-CM | POA: Diagnosis not present

## 2017-04-08 DIAGNOSIS — E1122 Type 2 diabetes mellitus with diabetic chronic kidney disease: Secondary | ICD-10-CM | POA: Diagnosis not present

## 2017-04-08 DIAGNOSIS — N185 Chronic kidney disease, stage 5: Secondary | ICD-10-CM | POA: Diagnosis not present

## 2017-04-09 DIAGNOSIS — I132 Hypertensive heart and chronic kidney disease with heart failure and with stage 5 chronic kidney disease, or end stage renal disease: Secondary | ICD-10-CM | POA: Diagnosis not present

## 2017-04-09 DIAGNOSIS — I5032 Chronic diastolic (congestive) heart failure: Secondary | ICD-10-CM | POA: Diagnosis not present

## 2017-04-09 DIAGNOSIS — E1122 Type 2 diabetes mellitus with diabetic chronic kidney disease: Secondary | ICD-10-CM | POA: Diagnosis not present

## 2017-04-09 DIAGNOSIS — T8781 Dehiscence of amputation stump: Secondary | ICD-10-CM | POA: Diagnosis not present

## 2017-04-09 DIAGNOSIS — E1142 Type 2 diabetes mellitus with diabetic polyneuropathy: Secondary | ICD-10-CM | POA: Diagnosis not present

## 2017-04-09 DIAGNOSIS — N185 Chronic kidney disease, stage 5: Secondary | ICD-10-CM | POA: Diagnosis not present

## 2017-04-10 ENCOUNTER — Encounter: Payer: Self-pay | Admitting: Physical Medicine & Rehabilitation

## 2017-04-10 ENCOUNTER — Encounter: Payer: Medicare Other | Attending: Physical Medicine & Rehabilitation | Admitting: Physical Medicine & Rehabilitation

## 2017-04-10 ENCOUNTER — Telehealth: Payer: Self-pay

## 2017-04-10 VITALS — BP 188/82 | HR 72

## 2017-04-10 DIAGNOSIS — I10 Essential (primary) hypertension: Secondary | ICD-10-CM | POA: Diagnosis not present

## 2017-04-10 DIAGNOSIS — D62 Acute posthemorrhagic anemia: Secondary | ICD-10-CM

## 2017-04-10 DIAGNOSIS — D631 Anemia in chronic kidney disease: Secondary | ICD-10-CM | POA: Diagnosis not present

## 2017-04-10 DIAGNOSIS — K219 Gastro-esophageal reflux disease without esophagitis: Secondary | ICD-10-CM | POA: Diagnosis not present

## 2017-04-10 DIAGNOSIS — T8130XA Disruption of wound, unspecified, initial encounter: Secondary | ICD-10-CM

## 2017-04-10 DIAGNOSIS — S78112A Complete traumatic amputation at level between left hip and knee, initial encounter: Secondary | ICD-10-CM

## 2017-04-10 DIAGNOSIS — E1129 Type 2 diabetes mellitus with other diabetic kidney complication: Secondary | ICD-10-CM | POA: Diagnosis not present

## 2017-04-10 DIAGNOSIS — E1122 Type 2 diabetes mellitus with diabetic chronic kidney disease: Secondary | ICD-10-CM | POA: Insufficient documentation

## 2017-04-10 DIAGNOSIS — Z794 Long term (current) use of insulin: Secondary | ICD-10-CM | POA: Insufficient documentation

## 2017-04-10 DIAGNOSIS — D638 Anemia in other chronic diseases classified elsewhere: Secondary | ICD-10-CM | POA: Diagnosis not present

## 2017-04-10 DIAGNOSIS — M009 Pyogenic arthritis, unspecified: Secondary | ICD-10-CM | POA: Diagnosis not present

## 2017-04-10 DIAGNOSIS — R000 Tachycardia, unspecified: Secondary | ICD-10-CM | POA: Insufficient documentation

## 2017-04-10 DIAGNOSIS — G8918 Other acute postprocedural pain: Secondary | ICD-10-CM | POA: Diagnosis not present

## 2017-04-10 DIAGNOSIS — R2689 Other abnormalities of gait and mobility: Secondary | ICD-10-CM | POA: Diagnosis not present

## 2017-04-10 DIAGNOSIS — I169 Hypertensive crisis, unspecified: Secondary | ICD-10-CM

## 2017-04-10 DIAGNOSIS — I129 Hypertensive chronic kidney disease with stage 1 through stage 4 chronic kidney disease, or unspecified chronic kidney disease: Secondary | ICD-10-CM | POA: Diagnosis not present

## 2017-04-10 DIAGNOSIS — N049 Nephrotic syndrome with unspecified morphologic changes: Secondary | ICD-10-CM | POA: Diagnosis not present

## 2017-04-10 DIAGNOSIS — N185 Chronic kidney disease, stage 5: Secondary | ICD-10-CM

## 2017-04-10 DIAGNOSIS — F419 Anxiety disorder, unspecified: Secondary | ICD-10-CM | POA: Insufficient documentation

## 2017-04-10 DIAGNOSIS — I5032 Chronic diastolic (congestive) heart failure: Secondary | ICD-10-CM | POA: Insufficient documentation

## 2017-04-10 DIAGNOSIS — IMO0002 Reserved for concepts with insufficient information to code with codable children: Secondary | ICD-10-CM

## 2017-04-10 DIAGNOSIS — F17210 Nicotine dependence, cigarettes, uncomplicated: Secondary | ICD-10-CM | POA: Insufficient documentation

## 2017-04-10 DIAGNOSIS — N2581 Secondary hyperparathyroidism of renal origin: Secondary | ICD-10-CM | POA: Diagnosis not present

## 2017-04-10 DIAGNOSIS — N184 Chronic kidney disease, stage 4 (severe): Secondary | ICD-10-CM | POA: Diagnosis not present

## 2017-04-10 DIAGNOSIS — I132 Hypertensive heart and chronic kidney disease with heart failure and with stage 5 chronic kidney disease, or end stage renal disease: Secondary | ICD-10-CM | POA: Insufficient documentation

## 2017-04-10 DIAGNOSIS — B1920 Unspecified viral hepatitis C without hepatic coma: Secondary | ICD-10-CM | POA: Insufficient documentation

## 2017-04-10 DIAGNOSIS — F329 Major depressive disorder, single episode, unspecified: Secondary | ICD-10-CM | POA: Diagnosis not present

## 2017-04-10 DIAGNOSIS — Z89612 Acquired absence of left leg above knee: Secondary | ICD-10-CM | POA: Insufficient documentation

## 2017-04-10 DIAGNOSIS — Z720 Tobacco use: Secondary | ICD-10-CM | POA: Diagnosis not present

## 2017-04-10 DIAGNOSIS — E1142 Type 2 diabetes mellitus with diabetic polyneuropathy: Secondary | ICD-10-CM | POA: Insufficient documentation

## 2017-04-10 MED ORDER — LIDOCAINE 5 % EX PTCH: 1.0000 | MEDICATED_PATCH | CUTANEOUS | 1 refills | Status: DC

## 2017-04-10 MED ORDER — DOXYCYCLINE HYCLATE 100 MG PO CAPS: 100.0000 mg | ORAL_CAPSULE | Freq: Two times a day (BID) | ORAL | 0 refills | Status: DC

## 2017-04-10 NOTE — Telephone Encounter (Signed)
Prior authorization started for lido derm patches for this patient

## 2017-04-10 NOTE — Progress Notes (Addendum)
Subjective:    [name], female    DOB: 10-Jan-1956, 62 y.o.   MRN: [id number]  HPI 61 y.o. female with history of diastolic CHF, R4ER, CKD- V, septic arthritis s/p L-AKA on 10/5 by Dr. Sharol Given presents for hospital follow up.   Since discharge, she states her pain has been tolerable.  She states she continues to have drainage.  She does not have an appointment with Dr. Sharol Given. Her BP remains elevated.  She has not seen PCP.  She saw Nephro and BP meds were changed.  Her CBGs have been relatively controlled. Labs drawn by [facility] as well.    Therapies: 3/week Mobility: Wheelchair at all times DME: Shower chair, other equipment patient possessed.   Pain Inventory Average Pain 7 Pain Right Now 8 My pain is constant, sharp and burning  In the last 24 hours, has pain interfered with the following? General activity 7 Relation with others 7 Enjoyment of life 8 What TIME of day is your pain at its worst? evening Sleep (in general) Poor  Pain is worse with: . Pain improves with: heat/ice Relief from Meds: .  Mobility ability to climb steps?  no do you drive?  no use a wheelchair  Function retired I need assistance with the following:  meal prep, household duties and shopping  Neuro/Psych No problems in this area  Prior Studies Any changes since last visit?  no  Physicians involved in your care Any changes since last visit?  no   Family History  Problem Relation Age of Onset  . Cancer Mother   . Heart disease Father   . Cancer Sister   . Hypertension Sister   . Cancer Brother   . Diabetes Brother   . Hypertension Brother    Social History   Social History  . Marital status: Single    Spouse name: N/A  . Number of children: N/A  . Years of education: N/A   Social History Main Topics  . Smoking status: Current Every Day Smoker    Packs/day: 0.33    Years: 35.00    Types: Cigarettes  . Smokeless tobacco: Never Used  . Alcohol use No   Comment: 03/10/2017 "I drink a wine cooler a few times/year"  . Drug use: No  . Sexual activity: No   Other Topics Concern  . None   Social History Narrative  . None   Past Surgical History:  Procedure Laterality Date  . AMPUTATION Left 03/20/2017   Procedure: LEFT ABOVE KNEE AMPUTATION;  Surgeon: Newt Minion, MD;  Location: [facility];  Service: Orthopedics;  Laterality: Left;  . APPENDECTOMY    . AV FISTULA PLACEMENT Left 09/13/2014   Procedure: Brachial Artery to Brachial Vein Gortex Four - Seven Stretch GRAFT INSERTION Left Forearm;  Surgeon: Mal Misty, MD;  Location: [facility];  Service: Vascular;  Laterality: Left;  . CHOLECYSTECTOMY OPEN    . COLON SURGERY    . EXCISIONAL TOTAL KNEE ARTHROPLASTY WITH ANTIBIOTIC SPACERS Left 02/06/2017   Procedure: Incisional total iknee with antibiotic spacer ;  Surgeon: Leandrew Koyanagi, MD;  Location: [facility];  Service: Orthopedics;  Laterality: Left;  . IR FLUORO GUIDE CV LINE RIGHT  02/10/2017  . IR REMOVAL TUN CV CATH W/O FL  04/01/2017  . IR US GUIDE VASC ACCESS RIGHT  02/10/2017  . IRRIGATION AND DEBRIDEMENT KNEE Left 03/12/2017   Procedure: IRRIGATION AND DEBRIDEMENT LEFT KNEE WITH WOUND VAC APPLICATION;  Surgeon: Frankey Shown  M, MD;  Location: [facility];  Service: Orthopedics;  Laterality: Left;  . JOINT REPLACEMENT    . KNEE ARTHROSCOPY Right 08/10/2014   Procedure: ARTHROSCOPY I & D KNEE;  Surgeon: Marianna Payment, MD;  Location: [facility];  Service: Orthopedics;  Laterality: Right;  . KNEE ARTHROSCOPY Left 08/11/2014   Procedure: ARTHROSCOPIC WASHOUT LEFT KNEE;  Surgeon: Marianna Payment, MD;  Location: [facility];  Service: Orthopedics;  Laterality: Left;  . KNEE ARTHROSCOPY Left 08/19/2014   Procedure: ARTHROSCOPIC WASHOUT LEFT KNEE;  Surgeon: Leandrew Koyanagi, MD;  Location: [facility];  Service: Orthopedics;  Laterality: Left;  . KNEE ARTHROSCOPY WITH LATERAL MENISECTOMY Left 04/04/2015   Procedure: AND PARTIAL LATERAL MENISECTOMY;  Surgeon: Leandrew Koyanagi, MD;   Location: [facility];  Service: Orthopedics;  Laterality: Left;  . KNEE ARTHROSCOPY WITH MEDIAL MENISECTOMY Left 04/04/2015   Procedure: LEFT KNEE ARTHROSCOPY WITH PARTIAL MEDIAL MENISCECTOMY  AND SYNOVECTOMY;  Surgeon: Leandrew Koyanagi, MD;  Location: [facility];  Service: Orthopedics;  Laterality: Left;  . SHOULDER ARTHROSCOPY Bilateral 08/10/2014   Procedure: I & D BILATERAL SHOULDERS ;  Surgeon: Marianna Payment, MD;  Location: [facility];  Service: Orthopedics;  Laterality: Bilateral;  . SMALL INTESTINE SURGERY     Due to Small Bowel Obstruction; "fixed it when they did my gallbladder OR"  . TEE WITHOUT CARDIOVERSION N/A 08/14/2014   Procedure: TRANSESOPHAGEAL ECHOCARDIOGRAM (TEE);  Surgeon: Thayer Headings, MD;  Location: [facility];  Service: Cardiovascular;  Laterality: N/A;  . TENOSYNOVECTOMY Right 08/11/2014   Procedure: RIGHT WRIST IRRIGATION AND DEBRIDEMENT, TENOSYNOVECTOMY;  Surgeon: Marianna Payment, MD;  Location: [facility];  Service: Orthopedics;  Laterality: Right;  . TOTAL KNEE ARTHROPLASTY Left 11/07/2015   Procedure: LEFT TOTAL KNEE ARTHROPLASTY WITH REVISION OF IMPLANTS;  Surgeon: Leandrew Koyanagi, MD;  Location: [facility];  Service: Orthopedics;  Laterality: Left;  . TUBAL LIGATION     Past Medical History:  Diagnosis Date  . Anemia   . Anxiety   . Arthritis    "in my joints" (03/10/2017)  . Chronic diastolic CHF (congestive heart failure) ([place])   . CKD (chronic kidney disease), stage IV ([place])    stage IV. previous HD, none currently 10/30/15 (confirmed 02/05/2017 & 03/10/2017)  . Depression    Chronic  . Diabetic peripheral neuropathy ([place])   . Dysrhythmia    tachycardia, normal ECHO 08-09-14  . GERD (gastroesophageal reflux disease)   . Heart murmur     dx'd 02/05/2017  . Hepatitis C    "tx'd in 2016; I'm negative now" (02/05/2017)  . History of blood transfusion 2017   "w/knee replacement"  . Hypertension   . Osteoarthritis of left knee   .  Peripheral neuropathy   . Protein calorie malnutrition ([place])   . Septic arthritis ([place])   . Slow transit constipation   . Type II diabetes mellitus ([name])    IDDM  . Uncontrolled hypertension 02/18/2013  . Unsteady gait    BP (!) 188/82   Pulse 72   LMP 03/16/2002   SpO2 97%   Opioid Risk Score:   Fall Risk Score:  `1  Depression screen PHQ 2/9  Depression screen [facility] 2/9 09/10/2015 06/29/2015 02/08/2015 12/12/2014 11/07/2014 01/20/2014 12/15/2013  Decreased Interest 0 0 2 0 0 0 0  Down, Depressed, Hopeless 0 0 2 0 0 0 1  PHQ - 2 Score 0 0 4 0 0 0 1  Altered sleeping - - 2 - - - -  Tired, decreased energy - - 2 - - - -  Change in appetite - - 2 - - - -  Feeling bad or failure about yourself  - - 2 - - - -  Trouble concentrating - - 1 - - - -  Moving slowly or fidgety/restless - - 1 - - - -  Suicidal thoughts - - 0 - - - -  PHQ-9 Score - - 14 - - - -  Difficult doing work/chores - - Very difficult - - - -    Review of Systems  Constitutional: Negative.   HENT: Negative.   Eyes: Negative.   Respiratory: Negative.   Cardiovascular: Negative.   Gastrointestinal: Negative.   Endocrine: Negative.   Genitourinary: Negative.   Musculoskeletal: Positive for arthralgias, gait problem and myalgias.  Skin: Negative.   Allergic/Immunologic: Negative.   Hematological: Negative.   Psychiatric/Behavioral: Negative.   All other systems reviewed and are negative.      Objective:   Physical Exam Gen: NAD. Vital signs reviewed HEENT: Normocephalic, atraumatic.  Cardio: RRR. No JVD. Resp: CTA b/l. Unlabored GI: BS positive and ND Musc/Skel:  Swelling and tenderness left AKA  Neuro: Alert/Oriented  Motor: B/l UE, RLE: 4+/5.  LLE: HF 4+/5  Skin: Serosanguinous drainage out of central area    Assessment & Plan:  61 y.o. female with history of diastolic CHF, U0EB, CKD- V, septic arthritis s/p L-AKA on 10/5 by Dr. Sharol Given presents for hospital follow up.   1. Functional deficits secondary  to left above knee amputation   Cont therapies  Follow up with Dr. Sharol Given - needs appointment  2. Pain Management  Continue meds  Initially stated tolerable, then states it hurts a lot  Cont Gabapentin 100 TID ordered  Cymbalta contraindicated  Will order Lidoderm patches  3. Skin/Wound Care  Wound with central dehiscence  Will order Doxy 100 BID  Follow up with Dr. Sharol Given  4. HTN  With hypertensive crisis  Meds being adjusted by Nephro, encouraged ambulatory monitoring and follow up with Nephro if remains elevated  5 T2DM with neuropathy  Controlled at present  6. Anemia of chronic disease  Labs being ordered by Nephro  Meds reviewed Referrals reviewed - needs appintment with Dr. Sharol Given and PCP All questions answered

## 2017-04-10 NOTE — Addendum Note (Signed)
Addended by: Delice Lesch A on: 04/10/2017 03:09 PM   Modules accepted: Orders

## 2017-04-13 ENCOUNTER — Telehealth: Payer: Self-pay

## 2017-04-13 DIAGNOSIS — I5032 Chronic diastolic (congestive) heart failure: Secondary | ICD-10-CM | POA: Diagnosis not present

## 2017-04-13 DIAGNOSIS — I132 Hypertensive heart and chronic kidney disease with heart failure and with stage 5 chronic kidney disease, or end stage renal disease: Secondary | ICD-10-CM | POA: Diagnosis not present

## 2017-04-13 DIAGNOSIS — E1122 Type 2 diabetes mellitus with diabetic chronic kidney disease: Secondary | ICD-10-CM | POA: Diagnosis not present

## 2017-04-13 DIAGNOSIS — T8781 Dehiscence of amputation stump: Secondary | ICD-10-CM | POA: Diagnosis not present

## 2017-04-13 DIAGNOSIS — E1142 Type 2 diabetes mellitus with diabetic polyneuropathy: Secondary | ICD-10-CM | POA: Diagnosis not present

## 2017-04-13 DIAGNOSIS — N185 Chronic kidney disease, stage 5: Secondary | ICD-10-CM | POA: Diagnosis not present

## 2017-04-13 NOTE — Telephone Encounter (Signed)
Patient called stating that Dr. Posey Pronto called in something for pain but her insurance doesn't cover the medication and is requesting that something else be called in.

## 2017-04-13 NOTE — Telephone Encounter (Signed)
Recieved approved status for this medication, notified pharmacy

## 2017-04-14 DIAGNOSIS — T8781 Dehiscence of amputation stump: Secondary | ICD-10-CM | POA: Diagnosis not present

## 2017-04-14 DIAGNOSIS — E1122 Type 2 diabetes mellitus with diabetic chronic kidney disease: Secondary | ICD-10-CM | POA: Diagnosis not present

## 2017-04-14 DIAGNOSIS — I5032 Chronic diastolic (congestive) heart failure: Secondary | ICD-10-CM | POA: Diagnosis not present

## 2017-04-14 DIAGNOSIS — I132 Hypertensive heart and chronic kidney disease with heart failure and with stage 5 chronic kidney disease, or end stage renal disease: Secondary | ICD-10-CM | POA: Diagnosis not present

## 2017-04-14 DIAGNOSIS — E1142 Type 2 diabetes mellitus with diabetic polyneuropathy: Secondary | ICD-10-CM | POA: Diagnosis not present

## 2017-04-14 DIAGNOSIS — N185 Chronic kidney disease, stage 5: Secondary | ICD-10-CM | POA: Diagnosis not present

## 2017-04-14 NOTE — Discharge Summary (Signed)
Physician Discharge Summary  Patient ID: DENEKA GREENWALT MRN: [id number] DOB/AGE: 1956/03/29 61 y.o.  Admit date: 03/26/2017 Discharge date: 04/01/2017  Discharge Diagnoses:  Principal Problem:   Unilateral AKA, left ([name]) Active Problems:   Anemia of chronic disease   Benign essential HTN   Diabetes mellitus type 2 in obese ([name])   Stage 5 chronic kidney disease not on chronic dialysis ([place])   Pain   Discharged Condition: stable  Significant Diagnostic Studies: Ct Abdomen Pelvis Wo Contrast  Result Date: 03/28/2017 CLINICAL DATA:  Left flank pain. EXAM: CT ABDOMEN AND PELVIS WITHOUT CONTRAST TECHNIQUE: Multidetector CT imaging of the abdomen and pelvis was performed following the standard protocol without IV contrast. COMPARISON:  06/27/2015 FINDINGS: Lower chest: Small bilateral pleural effusions with subsegmental atelectasis. Small pericardial effusion. Hepatobiliary: No focal liver abnormality is seen. Status post cholecystectomy. No biliary dilatation. Pancreas: Unremarkable. No pancreatic ductal dilatation or surrounding inflammatory changes. Spleen: Normal in size without focal abnormality. Adrenals/Urinary Tract: Thickening of the left adrenal gland, stable. No evidence of nephrolithiasis or hydronephrosis. Evaluation for renal masses is limited due to lack of IV contrast. Urinary bladder is normal. Stomach/Bowel: Stomach is within normal limits. No evidence of bowel wall thickening, distention, or inflammatory changes. Scattered colonic diverticulosis. Vascular/Lymphatic: Moderate retroperitoneal lymphadenopathy in the upper abdomen, left greater than right. Reproductive: Uterus and bilateral adnexa are unremarkable. Other: Abdominal wall edema.  No abdominopelvic ascites. Musculoskeletal: Stable compression deformity of the superior endplate of L1 vertebral body. Schmorl's node versus age-indeterminate superior endplate compression fracture of L3 vertebral body. IMPRESSION: No  evidence of obstructive uropathy. Moderate retroperitoneal lymphadenopathy in the upper abdomen, left greater than right, which may be reactive or malignant. Nonspecific thickening of the left adrenal gland. Chest wall edema. Stable compression deformity of L1 vertebral body. Compression deformity due to superior endplate compression fracture versus Schmorl's node of L3 vertebral body with approximately 30% height loss. Electronically Signed   By: Fidela Salisbury M.D.   On: 03/28/2017 17:11   Ir Removal Tun Cv Cath W/o Fl  Result Date: 04/01/2017 INDICATION: Antibiotics completed. Request for removal of tunneled central venous catheter. Initially placed by Dr. Annamaria Boots on 02/10/2017. Worked well with no signs of infection. EXAM: REMOVAL OF TUNNELED CENTRAL VENOUS CATHETER MEDICATIONS: 1% Lidocaine = 2 mL ANESTHESIA/SEDATION: No sedation medication given. COMPLICATIONS: None immediate. PROCEDURE: Informed written consent was obtained from the patient following an explanation of the procedure, risks, benefits and alternatives to treatment. A time out was performed prior to the initiation of the procedure. Maximal barrier sterile technique was utilized including mask, gloves, hand hygiene, and Betadine. 1% lidocaine was injected under sterile conditions along the subcutaneous tunnel. Utilizing a combination of blunt dissection and gentle traction, the catheter was removed intact. Hemostasis was obtained with manual compression. A dressing was placed. The patient tolerated the procedure well without immediate post procedural complication. IMPRESSION: Successful removal of tunneled central venous catheter. Read by:  Gareth Eagle, PA-C Electronically Signed   By: Aletta Edouard M.D.   On: 04/01/2017 10:51    Labs:  Basic Metabolic Panel: BMP Latest Ref Rng & Units 03/31/2017 03/29/2017 03/27/2017  Glucose 65 - 99 mg/dL 85 62(L) 89  BUN 6 - 20 mg/dL 68(H) 60(H) 60(H)  Creatinine 0.44 - 1.00 mg/dL 3.00(H)  2.98(H) 3.22(H)  Sodium 135 - 145 mmol/L 137 136 136  Potassium 3.5 - 5.1 mmol/L 3.8 4.0 3.7  Chloride 101 - 111 mmol/L 106 105 105  CO2 22 - 32 mmol/L 23  24 22  Calcium 8.9 - 10.3 mg/dL 8.4(L) 8.3(L) 8.4(L)    CBC: No results for input(s): WBC, NEUTROABS, HGB, HCT, MCV, PLT in the last 168 hours.  CBG: CBC Latest Ref Rng & Units 03/30/2017 03/27/2017 03/26/2017  WBC 4.0 - 10.5 K/uL 7.7 9.5 9.6  Hemoglobin 12.0 - 15.0 g/dL 8.0(L) 8.7(L) 7.2(L)  Hematocrit 36.0 - 46.0 % 25.9(L) 26.6(L) 22.8(L)  Platelets 150 - 400 K/uL 341 278 244    Brief HPI:    WINSLOW VERRILL is a 61 y.o. female with history of diastolic CHF, S3MH, CKD- V, septic arthritis left knee s/p I and D with antibiotic therapy but continued to poor wound healing who was admitted on 03/10/17 from [facility] with dehiscence of wound. History taken from chart review. She failed attempts at limb salvage and required L-AKA on 10/5 by Dr. Sharol Given. Post op has had issues with dizziness as well as acute on chronic anemia requiring multiple transfusions. Therapy ongoing with patient making good progress. She had significant decline in mobility and ability to carryout ALDs therefore  CIR was recommended for follow up therapy.    Hospital Course: ANELLE PARLOW was admitted to rehab 03/26/2017 for inpatient therapies to consist of PT, ST and OT at least three hours five days a week. Past admission physiatrist, therapy team and rehab RN have worked together to provide customized collaborative inpatient rehab. Blood pressures have been monitored on bid basis and were noted to be labile. Hydralazine has been titrated upwards to 75 mg qid with improvement in control. Diabetes has been monitored on ac/hs basis and HS snack was added to prevent hypoglycemia in mornings. Renal status has been monitored with serial checks and SCr has stabilized to 3.0.  H/H has been monitored with Hgb ranging from 7.2- 8.0. CT abdomen was ordered to to concerns of ongoing  anemia and question of drop in H/H and reports of flank pain. This was negative for bleed or hematoma and incidental note of L3 compression fracture and moderate adenopathy.  Her pain has improved with education on desensitization techniques.    She developed serosanguinous drainage from wound and Dr. Sharol Given felt that this was due to resolving hematoma. No signs of infection or cellulitis noted therefore to continue daily dressing changes with dry dressing and stump shrinker. HHRN arranged to monitor wound for healing and for any signs of infection. Tunneled IJ removed by IR on 10/17 prior to discharge and patient advised to keep compressive dressing in place for 24 hours. Mood has been stable and she has made steady progress to modified independent at wheelchair level. She will continue to receive follow up HHPT by [facility].    Rehab course: During patient's stay in rehab team conference was held to monitor patient's progress, set goals and discuss barriers to discharge. At admission, patient required min assist with basic self-care tasks and mobility. She has had improvement in activity tolerance, balance, postural control, as well as ability to compensate for deficits.  She is able to complete ADL tasks at modified independent level. She is modified independent for transfers and is able to ambulate 27' X 2 with supervision and RW.  Family education completed with son regarding supervision needed with ambulation and safety.     Disposition: 01-Home or Self Care  Diet: diabetic diet.   Special Instructions: 1. Cleanse wound with soap and water pat dry. Apply stump shrinker. Change daily.  2. Monitor blood sugars 2-3 times a day. 3.Repeat  CBC weekly for stability and follow up.   Discharge Instructions    Ambulatory referral to Physical Medicine Rehab    Complete by:  As directed    1-2 weeks transitional care appt     Discharge Medication List as of 04/01/2017  6:17 AM    START taking  these medications   Details  Insulin Glargine (LANTUS) 100 UNIT/ML Solostar Pen Inject 15 Units into the skin daily at 10 pm., Starting Tue 03/31/2017, Print    polyethylene glycol (MIRALAX / GLYCOLAX) packet Take 17 g by mouth 2 (two) times daily., Starting Tue 03/31/2017, OTC      CONTINUE these medications which have CHANGED   Details  amLODipine (NORVASC) 10 MG tablet Take 1 tablet (10 mg total) by mouth at bedtime., Starting Tue 03/31/2017, Print    calcitRIOL (ROCALTROL) 0.25 MCG capsule Take 1 capsule (0.25 mcg total) by mouth every Monday, Wednesday, and Friday., Starting Wed 04/01/2017, Print    ferrous sulfate 325 (65 FE) MG tablet Take 1 tablet (325 mg total) by mouth 2 (two) times daily with a meal., Starting Tue 03/31/2017, Print    furosemide (LASIX) 80 MG tablet Take 1 tablet (80 mg total) by mouth 2 (two) times daily., Starting Tue 03/31/2017, Until Thu 04/30/2017, Print    gabapentin (NEURONTIN) 100 MG capsule Take 1 capsule (100 mg total) by mouth 3 (three) times daily., Starting Tue 03/31/2017, Print    hydrALAZINE (APRESOLINE) 25 MG tablet Take 3 tablets (75 mg total) by mouth every 6 (six) hours., Starting Tue 03/31/2017, Print    methocarbamol (ROBAXIN) 500 MG tablet TAKE 1 TABLET BY MOUTH EVERY 6 HOURS AS NEEDED FOR MUSCLE SPASM, Print Rx # 30 w/ 2 refills    metoprolol succinate (TOPROL-XL) 50 MG 24 hr tablet Take 1 tablet (50 mg total) by mouth 2 (two) times daily., Starting Tue 03/31/2017, Print    oxyCODONE (OXY IR/ROXICODONE) 5 MG immediate release tablet Take 1-2 tablets (5-10 mg total) by mouth every 6 (six) hours as needed for severe pain., Starting Tue 03/31/2017, Print-Rx # 20 pills     sertraline (ZOLOFT) 100 MG tablet Take 1 tablet (100 mg total) by mouth daily., Starting Tue 03/31/2017, Print    nicotine (NICODERM CQ - DOSED IN MG/24 HOURS) 14 mg/24hr patch 14 mg patch daily 2 weeks then 7 mg patch daily 3 weeks and stop, Print      CONTINUE these  medications which have NOT CHANGED   Details  Blood Glucose Monitoring Suppl (ACCU-CHEK AVIVA PLUS) W/DEVICE KIT Check sugars TID for E11.65, Normal    calcium carbonate (TUMS EX) 750 MG chewable tablet Chew 1 tablet by mouth 3 (three) times daily as needed for heartburn. , Historical Med    glucose blood (ACCU-CHEK AVIVA) test strip Check sugars TID for E11.65, Normal    Lancet Devices (ACCU-CHEK SOFTCLIX) lancets Check sugars TID for E11.65, Normal    Multiple Vitamin (MULTIVITAMIN WITH MINERALS) TABS tablet Take 1 tablet by mouth daily., Historical Med    omeprazole (PRILOSEC) 20 MG capsule Take 20 mg by mouth daily before breakfast. , Starting Thu 10/04/2015, Historical Med    Darbepoetin Alfa (ARANESP) 100 MCG/0.5ML SOSY injection Inject 100 mcg into the skin every 30 (thirty) days., Historical Med    docusate sodium (COLACE) 100 MG capsule Take 1 capsule (100 mg total) by mouth 2 (two) times daily., Starting Thu 03/26/2017, Print    sennosides-docusate sodium (SENOKOT-S) 8.6-50 MG tablet Take 2 tablets by mouth at bedtime.,  Historical Med      STOP taking these medications     acetaminophen (TYLENOL) 500 MG tablet      diphenhydrAMINE (BENADRYL) 25 mg capsule      insulin glargine (LANTUS) 100 UNIT/ML injection      traMADol (ULTRAM) 50 MG tablet        Follow-up Information    Jamse Arn, MD Follow up.   Specialty:  Physical Medicine and Rehabilitation Why:  Office will call you with follow up appointment Contact information: [address] [place] [address] [phone]        Newt Minion, MD. Call.   Specialty:  Orthopedic Surgery Why:  for post op appointment Contact information: [place] [address] [phone]        [name], MD Follow up on 04/10/2017.   Specialty:  Family Medicine Why:  @ 3:40 pm (hospital follow up appt) with DR. Herbie Baltimore BEAM (Dr. Jonni Sanger no longer with practice) Contact  information: [place] [address] [phone]           Signed: Bary Leriche 04/14/2017, 7:52 PM

## 2017-04-14 NOTE — Telephone Encounter (Signed)
I spoke with Brandy Houston and she has not checked with pharmacy since Friday.  I told her that we have received information from insurance stating it was approved.  She will go back to pharmacy.

## 2017-04-14 NOTE — Telephone Encounter (Signed)
Pt phoned and stated her insurance does not cover the pain medication. She would like a different medication. Please advise.

## 2017-04-14 NOTE — Telephone Encounter (Signed)
Left message for Brandy Houston to call back and let us know what medication she is talking about.

## 2017-04-15 DIAGNOSIS — T8781 Dehiscence of amputation stump: Secondary | ICD-10-CM | POA: Diagnosis not present

## 2017-04-15 DIAGNOSIS — N185 Chronic kidney disease, stage 5: Secondary | ICD-10-CM | POA: Diagnosis not present

## 2017-04-15 DIAGNOSIS — I132 Hypertensive heart and chronic kidney disease with heart failure and with stage 5 chronic kidney disease, or end stage renal disease: Secondary | ICD-10-CM | POA: Diagnosis not present

## 2017-04-15 DIAGNOSIS — E1122 Type 2 diabetes mellitus with diabetic chronic kidney disease: Secondary | ICD-10-CM | POA: Diagnosis not present

## 2017-04-15 DIAGNOSIS — E1142 Type 2 diabetes mellitus with diabetic polyneuropathy: Secondary | ICD-10-CM | POA: Diagnosis not present

## 2017-04-15 DIAGNOSIS — I5032 Chronic diastolic (congestive) heart failure: Secondary | ICD-10-CM | POA: Diagnosis not present

## 2017-04-16 DIAGNOSIS — N185 Chronic kidney disease, stage 5: Secondary | ICD-10-CM | POA: Diagnosis not present

## 2017-04-16 DIAGNOSIS — E1122 Type 2 diabetes mellitus with diabetic chronic kidney disease: Secondary | ICD-10-CM | POA: Diagnosis not present

## 2017-04-16 DIAGNOSIS — T8781 Dehiscence of amputation stump: Secondary | ICD-10-CM | POA: Diagnosis not present

## 2017-04-16 DIAGNOSIS — I5032 Chronic diastolic (congestive) heart failure: Secondary | ICD-10-CM | POA: Diagnosis not present

## 2017-04-16 DIAGNOSIS — E1142 Type 2 diabetes mellitus with diabetic polyneuropathy: Secondary | ICD-10-CM | POA: Diagnosis not present

## 2017-04-16 DIAGNOSIS — I132 Hypertensive heart and chronic kidney disease with heart failure and with stage 5 chronic kidney disease, or end stage renal disease: Secondary | ICD-10-CM | POA: Diagnosis not present

## 2017-04-17 DIAGNOSIS — E1142 Type 2 diabetes mellitus with diabetic polyneuropathy: Secondary | ICD-10-CM | POA: Diagnosis not present

## 2017-04-17 DIAGNOSIS — E1122 Type 2 diabetes mellitus with diabetic chronic kidney disease: Secondary | ICD-10-CM | POA: Diagnosis not present

## 2017-04-17 DIAGNOSIS — I5032 Chronic diastolic (congestive) heart failure: Secondary | ICD-10-CM | POA: Diagnosis not present

## 2017-04-17 DIAGNOSIS — N185 Chronic kidney disease, stage 5: Secondary | ICD-10-CM | POA: Diagnosis not present

## 2017-04-17 DIAGNOSIS — I132 Hypertensive heart and chronic kidney disease with heart failure and with stage 5 chronic kidney disease, or end stage renal disease: Secondary | ICD-10-CM | POA: Diagnosis not present

## 2017-04-17 DIAGNOSIS — T8781 Dehiscence of amputation stump: Secondary | ICD-10-CM | POA: Diagnosis not present

## 2017-04-20 ENCOUNTER — Ambulatory Visit (INDEPENDENT_AMBULATORY_CARE_PROVIDER_SITE_OTHER): Payer: Medicare Other | Admitting: Orthopedic Surgery

## 2017-04-20 ENCOUNTER — Encounter (INDEPENDENT_AMBULATORY_CARE_PROVIDER_SITE_OTHER): Payer: Self-pay | Admitting: Orthopedic Surgery

## 2017-04-20 VITALS — Ht 67.0 in | Wt 163.4 lb

## 2017-04-20 DIAGNOSIS — Z89512 Acquired absence of left leg below knee: Secondary | ICD-10-CM

## 2017-04-20 DIAGNOSIS — N185 Chronic kidney disease, stage 5: Secondary | ICD-10-CM | POA: Diagnosis not present

## 2017-04-20 DIAGNOSIS — E1122 Type 2 diabetes mellitus with diabetic chronic kidney disease: Secondary | ICD-10-CM | POA: Diagnosis not present

## 2017-04-20 DIAGNOSIS — I132 Hypertensive heart and chronic kidney disease with heart failure and with stage 5 chronic kidney disease, or end stage renal disease: Secondary | ICD-10-CM | POA: Diagnosis not present

## 2017-04-20 DIAGNOSIS — T8781 Dehiscence of amputation stump: Secondary | ICD-10-CM | POA: Diagnosis not present

## 2017-04-20 DIAGNOSIS — E1142 Type 2 diabetes mellitus with diabetic polyneuropathy: Secondary | ICD-10-CM | POA: Diagnosis not present

## 2017-04-20 DIAGNOSIS — I5032 Chronic diastolic (congestive) heart failure: Secondary | ICD-10-CM | POA: Diagnosis not present

## 2017-04-20 MED ORDER — HYDROCODONE-ACETAMINOPHEN 5-325 MG PO TABS: 1.0000 | ORAL_TABLET | ORAL | 0 refills | Status: DC | PRN

## 2017-04-20 NOTE — Progress Notes (Signed)
Office Visit Note   Patient: Brandy Houston           Date of Birth: Dec 27, 1955           MRN: [id number] Visit Date: 04/20/2017              Requested by: [name], [address] [place] [place], [place] [address] PCP: [name], MD  Chief Complaint  Patient presents with  . Left Leg - Follow-up    03/20/17 Lt AKA      HPI: Patient presents in follow-up for left above-the-knee amputation she is 4 weeks out.  Examination incision is healing nicely there is no redness no cellulitis she states she is currently on a new antibiotic.  She complains of increased pain she is taking Tylenol for pain.  Assessment & Plan: Visit Diagnoses:  1. Acquired absence of left leg below knee [facility])     Plan: Prescription provided for Vicodin for her phantom pain.  Continue Dial soap cleansing dry dressing changes continue with her stump shrinker.  Follow-Up Instructions: Return in about 2 weeks (around 05/04/2017).   Ortho Exam  Patient is alert, oriented, no adenopathy, well-dressed, normal affect, normal respiratory effort. Examination the wound edges are well approximated.  There is one small area of granulation tissue in the mid aspect of the wound that is about 10 mm in diameter there is no necrosis no cellulitis no purulence no signs of infection.  Imaging: No results found. No images are attached to the encounter.  Labs: Lab Results  Component Value Date   HGBA1C 6.2 (H) 02/05/2017   HGBA1C 6.4 (H) 11/08/2015   HGBA1C 7.3 (H) 10/29/2015   ESRSEDRATE 102 (H) 10/29/2015   ESRSEDRATE 120 (H) 08/09/2014   CRP 1.9 (H) 03/12/2017   CRP 1.4 (H) 10/29/2015   CRP 1.2 (H) 06/29/2015   REPTSTATUS 02/10/2017 FINAL 02/08/2017   GRAMSTAIN  02/06/2017    FEW WBC PRESENT, PREDOMINANTLY MONONUCLEAR NO ORGANISMS SEEN    CULT >=100,000 COLONIES/mL YEAST (A) 02/08/2017   LABORGA NO GROWTH 02/03/2017    Orders:  No orders of the defined types were placed in this  encounter.  Meds ordered this encounter  Medications  . HYDROcodone-acetaminophen (NORCO/VICODIN) 5-325 MG tablet    Sig: Take 1 tablet every 4 (four) hours as needed by mouth for moderate pain.    Dispense:  30 tablet    Refill:  0     Procedures: No procedures performed  Clinical Data: No additional findings.  ROS:  All other systems negative, except as noted in the HPI. Review of Systems  Objective: Vital Signs: Ht 5\' 7"  (1.702 m)   Wt 163 lb 7.2 oz (74.1 kg)   LMP 03/16/2002   BMI 25.60 kg/m   Specialty Comments:  No specialty comments available.  PMFS History: Patient Active Problem List   Diagnosis Date Noted  . Pain   . Benign essential HTN   . Diabetes mellitus type 2 in obese ([place])   . Stage 5 chronic kidney disease not on chronic dialysis ([place])   . Unilateral AKA, left ([place]) 03/26/2017  . Above knee amputation status, left ([place])   . Acute blood loss anemia   . Chronic diastolic heart failure ([place])   . Post-operative pain   . Leukocytosis   . DM type 2 causing CKD stage 5 ([place]) 03/21/2017  . CKD (chronic kidney disease), stage V ([place]) 03/17/2017  . Wound dehiscence 03/10/2017  .  Chronic pain of left knee 03/05/2017  . Fever 02/08/2017  . Infection of prosthetic left knee joint ([place]) 02/07/2017  . AKI (acute kidney injury) ([place])   . Hemarthrosis   . Anemia of chronic disease 02/05/2017  . Diabetes mellitus, type II ([place]) 02/05/2017  . Dysplasia of cervix, high grade CIN 2 01/12/2017  . ASCUS with positive high risk HPV cervical 12/09/2016  . Chronic diastolic CHF (congestive heart failure) ([place]) 11/08/2015  . Total knee replacement status   . Diabetic peripheral neuropathy ([place])   . Anxiety state   . Depression   . Normocytic anemia 11/07/2015  . Chronic kidney disease (CKD), stage IV (severe) ([place])   . Hepatitis C 08/16/2014  . Peripheral neuropathy 06/24/2013  . 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 Essential hypertension 02/18/2013    Past Medical History:  Diagnosis Date  . Anemia   . Anxiety   . Arthritis    "in my joints" (03/10/2017)  . Chronic diastolic CHF (congestive heart failure) ([place])   . CKD (chronic kidney disease), stage IV ([place])    stage IV. previous HD, none currently 10/30/15 (confirmed 02/05/2017 & 03/10/2017)  . Depression    Chronic  . Diabetic peripheral neuropathy ([place])   . Dysrhythmia    tachycardia, normal ECHO 08-09-14  . GERD (gastroesophageal reflux disease)   . Heart murmur     dx'd 02/05/2017  . Hepatitis C    "tx'd in 2016; I'm negative now" (02/05/2017)  . History of blood transfusion 2017   "w/knee replacement"  . Hypertension   . Osteoarthritis of left knee   . Peripheral neuropathy   . Protein calorie malnutrition ([employer])   . Septic arthritis ([place])   . Slow transit constipation   . Type II diabetes mellitus ([name])    IDDM  . Uncontrolled hypertension 02/18/2013  . Unsteady gait     Family History  Problem Relation Age of Onset  . Cancer Mother   . Heart disease Father   . Cancer Sister   . Hypertension Sister   . Cancer Brother   . Diabetes Brother   . Hypertension Brother     Past Surgical History:  Procedure Laterality Date  . APPENDECTOMY    . CHOLECYSTECTOMY OPEN    . COLON SURGERY    . IR FLUORO GUIDE CV LINE RIGHT  02/10/2017  . IR REMOVAL TUN CV CATH W/O FL  04/01/2017  . IR US GUIDE VASC ACCESS RIGHT  02/10/2017  . JOINT REPLACEMENT    . SMALL INTESTINE SURGERY     Due to Small Bowel Obstruction; "fixed it when they did my gallbladder OR"  . TUBAL LIGATION     Social History   Occupational History  . Not on file  Tobacco Use  . Smoking status: Current Every Day Smoker    Packs/day: 0.33    Years: 35.00    Pack years: 11.55    Types: Cigarettes  . Smokeless tobacco: Never Used  Substance and Sexual Activity  . Alcohol use: No    Alcohol/week: 0.0 oz    Comment: 03/10/2017 "I drink a wine cooler a few times/year"  . Drug use: No  . Sexual activity:  No

## 2017-04-21 ENCOUNTER — Other Ambulatory Visit: Payer: Medicaid Other

## 2017-04-21 ENCOUNTER — Telehealth: Payer: Self-pay

## 2017-04-21 ENCOUNTER — Ambulatory Visit: Payer: Medicaid Other | Admitting: Hematology and Oncology

## 2017-04-21 DIAGNOSIS — T8781 Dehiscence of amputation stump: Secondary | ICD-10-CM | POA: Diagnosis not present

## 2017-04-21 DIAGNOSIS — I5032 Chronic diastolic (congestive) heart failure: Secondary | ICD-10-CM | POA: Diagnosis not present

## 2017-04-21 DIAGNOSIS — E1122 Type 2 diabetes mellitus with diabetic chronic kidney disease: Secondary | ICD-10-CM | POA: Diagnosis not present

## 2017-04-21 DIAGNOSIS — N185 Chronic kidney disease, stage 5: Secondary | ICD-10-CM | POA: Diagnosis not present

## 2017-04-21 DIAGNOSIS — I132 Hypertensive heart and chronic kidney disease with heart failure and with stage 5 chronic kidney disease, or end stage renal disease: Secondary | ICD-10-CM | POA: Diagnosis not present

## 2017-04-21 DIAGNOSIS — E1142 Type 2 diabetes mellitus with diabetic polyneuropathy: Secondary | ICD-10-CM | POA: Diagnosis not present

## 2017-04-21 NOTE — Telephone Encounter (Signed)
Pt did not show for appointment.  Pt was called and a message was left for pt to reschedule.

## 2017-04-22 DIAGNOSIS — I132 Hypertensive heart and chronic kidney disease with heart failure and with stage 5 chronic kidney disease, or end stage renal disease: Secondary | ICD-10-CM | POA: Diagnosis not present

## 2017-04-22 DIAGNOSIS — E1122 Type 2 diabetes mellitus with diabetic chronic kidney disease: Secondary | ICD-10-CM | POA: Diagnosis not present

## 2017-04-22 DIAGNOSIS — T8781 Dehiscence of amputation stump: Secondary | ICD-10-CM | POA: Diagnosis not present

## 2017-04-22 DIAGNOSIS — E1142 Type 2 diabetes mellitus with diabetic polyneuropathy: Secondary | ICD-10-CM | POA: Diagnosis not present

## 2017-04-22 DIAGNOSIS — I5032 Chronic diastolic (congestive) heart failure: Secondary | ICD-10-CM | POA: Diagnosis not present

## 2017-04-22 DIAGNOSIS — N185 Chronic kidney disease, stage 5: Secondary | ICD-10-CM | POA: Diagnosis not present

## 2017-04-23 ENCOUNTER — Encounter: Payer: Self-pay | Admitting: Physical Medicine & Rehabilitation

## 2017-04-23 ENCOUNTER — Encounter: Payer: Medicare Other | Attending: Physical Medicine & Rehabilitation | Admitting: Physical Medicine & Rehabilitation

## 2017-04-23 VITALS — BP 142/82 | HR 73

## 2017-04-23 DIAGNOSIS — B1920 Unspecified viral hepatitis C without hepatic coma: Secondary | ICD-10-CM | POA: Diagnosis not present

## 2017-04-23 DIAGNOSIS — R000 Tachycardia, unspecified: Secondary | ICD-10-CM | POA: Diagnosis not present

## 2017-04-23 DIAGNOSIS — D638 Anemia in other chronic diseases classified elsewhere: Secondary | ICD-10-CM | POA: Diagnosis not present

## 2017-04-23 DIAGNOSIS — Z794 Long term (current) use of insulin: Secondary | ICD-10-CM | POA: Diagnosis not present

## 2017-04-23 DIAGNOSIS — Z89612 Acquired absence of left leg above knee: Secondary | ICD-10-CM

## 2017-04-23 DIAGNOSIS — E1142 Type 2 diabetes mellitus with diabetic polyneuropathy: Secondary | ICD-10-CM | POA: Diagnosis not present

## 2017-04-23 DIAGNOSIS — F419 Anxiety disorder, unspecified: Secondary | ICD-10-CM | POA: Insufficient documentation

## 2017-04-23 DIAGNOSIS — I132 Hypertensive heart and chronic kidney disease with heart failure and with stage 5 chronic kidney disease, or end stage renal disease: Secondary | ICD-10-CM | POA: Diagnosis not present

## 2017-04-23 DIAGNOSIS — R2689 Other abnormalities of gait and mobility: Secondary | ICD-10-CM | POA: Insufficient documentation

## 2017-04-23 DIAGNOSIS — K219 Gastro-esophageal reflux disease without esophagitis: Secondary | ICD-10-CM | POA: Diagnosis not present

## 2017-04-23 DIAGNOSIS — M009 Pyogenic arthritis, unspecified: Secondary | ICD-10-CM | POA: Insufficient documentation

## 2017-04-23 DIAGNOSIS — E1122 Type 2 diabetes mellitus with diabetic chronic kidney disease: Secondary | ICD-10-CM | POA: Insufficient documentation

## 2017-04-23 DIAGNOSIS — I169 Hypertensive crisis, unspecified: Secondary | ICD-10-CM

## 2017-04-23 DIAGNOSIS — I5032 Chronic diastolic (congestive) heart failure: Secondary | ICD-10-CM | POA: Diagnosis not present

## 2017-04-23 DIAGNOSIS — T8130XA Disruption of wound, unspecified, initial encounter: Secondary | ICD-10-CM | POA: Diagnosis not present

## 2017-04-23 DIAGNOSIS — N185 Chronic kidney disease, stage 5: Secondary | ICD-10-CM | POA: Insufficient documentation

## 2017-04-23 DIAGNOSIS — G8918 Other acute postprocedural pain: Secondary | ICD-10-CM | POA: Diagnosis not present

## 2017-04-23 DIAGNOSIS — I10 Essential (primary) hypertension: Secondary | ICD-10-CM

## 2017-04-23 DIAGNOSIS — F17210 Nicotine dependence, cigarettes, uncomplicated: Secondary | ICD-10-CM | POA: Insufficient documentation

## 2017-04-23 DIAGNOSIS — F329 Major depressive disorder, single episode, unspecified: Secondary | ICD-10-CM | POA: Diagnosis not present

## 2017-04-23 DIAGNOSIS — S78112A Complete traumatic amputation at level between left hip and knee, initial encounter: Secondary | ICD-10-CM

## 2017-04-23 NOTE — Progress Notes (Signed)
Subjective:    [name], female    DOB: 09/17/1955, 61 y.o.   MRN: [id number]  HPI 61 y.o. female with history of diastolic CHF, W0JW, CKD- V, septic arthritis s/p L-AKA on 10/5 by Dr. Sharol Given presents for follow up.   Last clinic visit 04/10/17.  Since that time, she continues with therapies.  She saw Ortho and stiches were removed. Her Gabapentin is helping.  Lidoderm patches do not help.  BP remains elevated.  CBGs have been relatively controlled.   Pain Inventory Average Pain 3 Pain Right Now 3 My pain is constant, sharp and burning  In the last 24 hours, has pain interfered with the following? General activity 4 Relation with others 3 Enjoyment of life 8 What TIME of day is your pain at its worst? evening Sleep (in general) Poor  Pain is worse with: . Pain improves with: heat/ice Relief from Meds: .  Mobility ability to climb steps?  no do you drive?  no use a wheelchair  Function retired I need assistance with the following:  meal prep, household duties and shopping  Neuro/Psych No problems in this area  Prior Studies Any changes since last visit?  no  Physicians involved in your care Any changes since last visit?  no   Family History  Problem Relation Age of Onset  . Cancer Mother   . Heart disease Father   . Cancer Sister   . Hypertension Sister   . Cancer Brother   . Diabetes Brother   . Hypertension Brother    Social History   Socioeconomic History  . Marital status: Single    Spouse name: None  . Number of children: None  . Years of education: None  . Highest education level: None  Social Needs  . Financial resource strain: None  . Food insecurity - worry: None  . Food insecurity - inability: None  . Transportation needs - medical: None  . Transportation needs - non-medical: None  Occupational History  . None  Tobacco Use  . Smoking status: Current Every Day Smoker    Packs/day: 0.33    Years: 35.00    Pack years:  11.55    Types: Cigarettes  . Smokeless tobacco: Never Used  Substance and Sexual Activity  . Alcohol use: No    Alcohol/week: 0.0 oz    Comment: 03/10/2017 "I drink a wine cooler a few times/year"  . Drug use: No  . Sexual activity: No  Other Topics Concern  . None  Social History Narrative  . None   Past Surgical History:  Procedure Laterality Date  . APPENDECTOMY    . CHOLECYSTECTOMY OPEN    . COLON SURGERY    . IR FLUORO GUIDE CV LINE RIGHT  02/10/2017  . IR REMOVAL TUN CV CATH W/O FL  04/01/2017  . IR US GUIDE VASC ACCESS RIGHT  02/10/2017  . JOINT REPLACEMENT    . SMALL INTESTINE SURGERY     Due to Small Bowel Obstruction; "fixed it when they did my gallbladder OR"  . TUBAL LIGATION     Past Medical History:  Diagnosis Date  . Anemia   . Anxiety   . Arthritis    "in my joints" (03/10/2017)  . Chronic diastolic CHF (congestive heart failure) ([place])   . CKD (chronic kidney disease), stage IV ([place])    stage IV. previous HD, none currently 10/30/15 (confirmed 02/05/2017 & 03/10/2017)  . Depression    Chronic  . Diabetic  peripheral neuropathy ([place])   . Dysrhythmia    tachycardia, normal ECHO 08-09-14  . GERD (gastroesophageal reflux disease)   . Heart murmur     dx'd 02/05/2017  . Hepatitis C    "tx'd in 2016; I'm negative now" (02/05/2017)  . History of blood transfusion 2017   "w/knee replacement"  . Hypertension   . Osteoarthritis of left knee   . Peripheral neuropathy   . Protein calorie malnutrition ([place])   . Septic arthritis ([place])   . Slow transit constipation   . Type II diabetes mellitus ([name])    IDDM  . Uncontrolled hypertension 02/18/2013  . Unsteady gait    BP (!) 197/88   Pulse 73   LMP 03/16/2002   SpO2 98%   Opioid Risk Score:   Fall Risk Score:  `1  Depression screen PHQ 2/9  Depression screen [facility] 2/9 09/10/2015 06/29/2015 02/08/2015 12/12/2014 11/07/2014 01/20/2014 12/15/2013  Decreased Interest 0 0 2 0 0 0 0  Down, Depressed, Hopeless 0 0 2 0 0 0 1    PHQ - 2 Score 0 0 4 0 0 0 1  Altered sleeping - - 2 - - - -  Tired, decreased energy - - 2 - - - -  Change in appetite - - 2 - - - -  Feeling bad or failure about yourself  - - 2 - - - -  Trouble concentrating - - 1 - - - -  Moving slowly or fidgety/restless - - 1 - - - -  Suicidal thoughts - - 0 - - - -  PHQ-9 Score - - 14 - - - -  Difficult doing work/chores - - Very difficult - - - -    Review of Systems  Constitutional: Negative.   HENT: Negative.   Eyes: Negative.   Respiratory: Negative.   Cardiovascular: Negative.   Gastrointestinal: Negative.   Endocrine: Negative.   Genitourinary: Negative.   Musculoskeletal: Positive for arthralgias, gait problem and myalgias.  Skin: Negative.   Allergic/Immunologic: Negative.   Hematological: Negative.   Psychiatric/Behavioral: Negative.   All other systems reviewed and are negative.     Objective:   Physical Exam Gen: NAD. Vital signs reviewed HEENT: Normocephalic, atraumatic.  Cardio: RRR. No JVD. Resp: CTA b/l. Unlabored GI: BS positive and ND Musc/Skel:  Swelling and tenderness left AKA  Neuro: Alert/Oriented  Motor: B/l UE, RLE: 4+/5.  LLE: HF 4+/5  Skin: Sanguinous drainage out of central area    Assessment & Plan:  61 y.o. female with history of diastolic CHF, T6RW, CKD- V, septic arthritis s/p L-AKA on 10/5 by Dr. Sharol Given presents for follow up.   1. Functional deficits secondary to left above knee amputation   Cont therapies  Cont follow up with Dr. Sharol Given  2. Pain Management  Cymbalta contraindicated  Lidoderm patches ineffective  Cont Gabapentin 100 TID ordered  3. Skin/Wound Care  Wound with central dehiscence  Cont course of Doxy 100 BID  Cont follow up with Dr. Sharol Given  4. HTN  With hypertensive crisis today  Meds being adjusted by Nephro, encouraged ambulatory monitoring and follow up with Nephro again

## 2017-04-24 DIAGNOSIS — E1122 Type 2 diabetes mellitus with diabetic chronic kidney disease: Secondary | ICD-10-CM | POA: Diagnosis not present

## 2017-04-24 DIAGNOSIS — T8781 Dehiscence of amputation stump: Secondary | ICD-10-CM | POA: Diagnosis not present

## 2017-04-24 DIAGNOSIS — I5032 Chronic diastolic (congestive) heart failure: Secondary | ICD-10-CM | POA: Diagnosis not present

## 2017-04-24 DIAGNOSIS — E1142 Type 2 diabetes mellitus with diabetic polyneuropathy: Secondary | ICD-10-CM | POA: Diagnosis not present

## 2017-04-24 DIAGNOSIS — N185 Chronic kidney disease, stage 5: Secondary | ICD-10-CM | POA: Diagnosis not present

## 2017-04-24 DIAGNOSIS — I132 Hypertensive heart and chronic kidney disease with heart failure and with stage 5 chronic kidney disease, or end stage renal disease: Secondary | ICD-10-CM | POA: Diagnosis not present

## 2017-04-24 NOTE — Addendum Note (Signed)
Addendum  created 04/24/17 0954 by Rica Koyanagi, MD   Sign clinical note

## 2017-04-24 NOTE — Anesthesia Postprocedure Evaluation (Signed)
Anesthesia Post Note  Patient: Brandy Houston  Procedure(s) Performed: IRRIGATION AND DEBRIDEMENT LEFT KNEE WITH WOUND VAC APPLICATION (Left Knee)     Patient location during evaluation: PACU Anesthesia Type: General Level of consciousness: awake and sedated Pain management: pain level controlled Vital Signs Assessment: post-procedure vital signs reviewed and stable Respiratory status: spontaneous breathing, nonlabored ventilation, respiratory function stable and patient connected to nasal cannula oxygen Cardiovascular status: blood pressure returned to baseline and stable Postop Assessment: no apparent nausea or vomiting Anesthetic complications: no    Last Vitals:  Vitals:   03/26/17 1542 03/26/17 1825  BP: (!) 133/55 (!) 159/72  Pulse: 63 75  Resp: 16 20  Temp: 37.1 C 36.7 C  SpO2: 99%     Last Pain:  Vitals:   03/26/17 1825  TempSrc: Oral  PainSc:                  Caroleena Paolini,JAMES TERRILL

## 2017-04-27 ENCOUNTER — Other Ambulatory Visit (HOSPITAL_COMMUNITY): Payer: Self-pay | Admitting: *Deleted

## 2017-04-27 DIAGNOSIS — E1142 Type 2 diabetes mellitus with diabetic polyneuropathy: Secondary | ICD-10-CM | POA: Diagnosis not present

## 2017-04-27 DIAGNOSIS — T8781 Dehiscence of amputation stump: Secondary | ICD-10-CM | POA: Diagnosis not present

## 2017-04-27 DIAGNOSIS — I5032 Chronic diastolic (congestive) heart failure: Secondary | ICD-10-CM | POA: Diagnosis not present

## 2017-04-27 DIAGNOSIS — I132 Hypertensive heart and chronic kidney disease with heart failure and with stage 5 chronic kidney disease, or end stage renal disease: Secondary | ICD-10-CM | POA: Diagnosis not present

## 2017-04-27 DIAGNOSIS — E1122 Type 2 diabetes mellitus with diabetic chronic kidney disease: Secondary | ICD-10-CM | POA: Diagnosis not present

## 2017-04-27 DIAGNOSIS — N185 Chronic kidney disease, stage 5: Secondary | ICD-10-CM | POA: Diagnosis not present

## 2017-04-28 ENCOUNTER — Ambulatory Visit (HOSPITAL_COMMUNITY)
Admission: RE | Admit: 2017-04-28 | Discharge: 2017-04-28 | Disposition: A | Discharge status: Home / Self Care | Payer: Medicare Other | Source: Ambulatory Visit | Attending: Nephrology | Admitting: Nephrology

## 2017-04-28 VITALS — BP 184/72 | HR 73 | Temp 98.6°F | Resp 18

## 2017-04-28 DIAGNOSIS — N184 Chronic kidney disease, stage 4 (severe): Secondary | ICD-10-CM | POA: Diagnosis not present

## 2017-04-28 DIAGNOSIS — D631 Anemia in chronic kidney disease: Secondary | ICD-10-CM | POA: Insufficient documentation

## 2017-04-28 LAB — POCT HEMOGLOBIN-HEMACUE: Hemoglobin: 12.2 g/dL (ref 12.0–15.0)

## 2017-04-28 MED ORDER — SODIUM CHLORIDE 0.9 % IV SOLN
510.0000 mg | Freq: Once | INTRAVENOUS | Status: AC
  Administered 2017-04-28: 510 mg via INTRAVENOUS
  Filled 2017-04-28: qty 17

## 2017-04-28 MED ORDER — DARBEPOETIN ALFA 200 MCG/0.4ML IJ SOSY: 200.0000 ug | PREFILLED_SYRINGE | INTRAMUSCULAR | Status: DC

## 2017-04-29 DIAGNOSIS — I132 Hypertensive heart and chronic kidney disease with heart failure and with stage 5 chronic kidney disease, or end stage renal disease: Secondary | ICD-10-CM | POA: Diagnosis not present

## 2017-04-29 DIAGNOSIS — E1122 Type 2 diabetes mellitus with diabetic chronic kidney disease: Secondary | ICD-10-CM | POA: Diagnosis not present

## 2017-04-29 DIAGNOSIS — N185 Chronic kidney disease, stage 5: Secondary | ICD-10-CM | POA: Diagnosis not present

## 2017-04-29 DIAGNOSIS — E1142 Type 2 diabetes mellitus with diabetic polyneuropathy: Secondary | ICD-10-CM | POA: Diagnosis not present

## 2017-04-29 DIAGNOSIS — I5032 Chronic diastolic (congestive) heart failure: Secondary | ICD-10-CM | POA: Diagnosis not present

## 2017-04-29 DIAGNOSIS — T8781 Dehiscence of amputation stump: Secondary | ICD-10-CM | POA: Diagnosis not present

## 2017-04-30 DIAGNOSIS — I132 Hypertensive heart and chronic kidney disease with heart failure and with stage 5 chronic kidney disease, or end stage renal disease: Secondary | ICD-10-CM | POA: Diagnosis not present

## 2017-04-30 DIAGNOSIS — E1142 Type 2 diabetes mellitus with diabetic polyneuropathy: Secondary | ICD-10-CM | POA: Diagnosis not present

## 2017-04-30 DIAGNOSIS — E1122 Type 2 diabetes mellitus with diabetic chronic kidney disease: Secondary | ICD-10-CM | POA: Diagnosis not present

## 2017-04-30 DIAGNOSIS — I5032 Chronic diastolic (congestive) heart failure: Secondary | ICD-10-CM | POA: Diagnosis not present

## 2017-04-30 DIAGNOSIS — N185 Chronic kidney disease, stage 5: Secondary | ICD-10-CM | POA: Diagnosis not present

## 2017-04-30 DIAGNOSIS — T8781 Dehiscence of amputation stump: Secondary | ICD-10-CM | POA: Diagnosis not present

## 2017-05-01 DIAGNOSIS — E1142 Type 2 diabetes mellitus with diabetic polyneuropathy: Secondary | ICD-10-CM | POA: Diagnosis not present

## 2017-05-01 DIAGNOSIS — T8781 Dehiscence of amputation stump: Secondary | ICD-10-CM | POA: Diagnosis not present

## 2017-05-01 DIAGNOSIS — I132 Hypertensive heart and chronic kidney disease with heart failure and with stage 5 chronic kidney disease, or end stage renal disease: Secondary | ICD-10-CM | POA: Diagnosis not present

## 2017-05-01 DIAGNOSIS — E1122 Type 2 diabetes mellitus with diabetic chronic kidney disease: Secondary | ICD-10-CM | POA: Diagnosis not present

## 2017-05-01 DIAGNOSIS — I5032 Chronic diastolic (congestive) heart failure: Secondary | ICD-10-CM | POA: Diagnosis not present

## 2017-05-01 DIAGNOSIS — N185 Chronic kidney disease, stage 5: Secondary | ICD-10-CM | POA: Diagnosis not present

## 2017-05-04 ENCOUNTER — Encounter (INDEPENDENT_AMBULATORY_CARE_PROVIDER_SITE_OTHER): Payer: Self-pay | Admitting: Orthopedic Surgery

## 2017-05-04 ENCOUNTER — Ambulatory Visit (INDEPENDENT_AMBULATORY_CARE_PROVIDER_SITE_OTHER): Payer: Medicare Other | Admitting: Orthopedic Surgery

## 2017-05-04 VITALS — Ht 67.0 in | Wt 163.0 lb

## 2017-05-04 DIAGNOSIS — Z89612 Acquired absence of left leg above knee: Secondary | ICD-10-CM

## 2017-05-04 DIAGNOSIS — E1142 Type 2 diabetes mellitus with diabetic polyneuropathy: Secondary | ICD-10-CM | POA: Diagnosis not present

## 2017-05-04 DIAGNOSIS — T8781 Dehiscence of amputation stump: Secondary | ICD-10-CM | POA: Diagnosis not present

## 2017-05-04 DIAGNOSIS — I5032 Chronic diastolic (congestive) heart failure: Secondary | ICD-10-CM | POA: Diagnosis not present

## 2017-05-04 DIAGNOSIS — S78112A Complete traumatic amputation at level between left hip and knee, initial encounter: Secondary | ICD-10-CM

## 2017-05-04 DIAGNOSIS — E1122 Type 2 diabetes mellitus with diabetic chronic kidney disease: Secondary | ICD-10-CM | POA: Diagnosis not present

## 2017-05-04 DIAGNOSIS — N185 Chronic kidney disease, stage 5: Secondary | ICD-10-CM | POA: Diagnosis not present

## 2017-05-04 DIAGNOSIS — I132 Hypertensive heart and chronic kidney disease with heart failure and with stage 5 chronic kidney disease, or end stage renal disease: Secondary | ICD-10-CM | POA: Diagnosis not present

## 2017-05-04 NOTE — Progress Notes (Signed)
Office Visit Note   Patient: Brandy Houston           Date of Birth: Nov 10, 1955           MRN: [id number] Visit Date: 05/04/2017              Requested by: [name], [address], [place] [address] PCP: [name], MD  Chief Complaint  Patient presents with  . Left Leg - Routine Post Op    03/20/17 left AKA      HPI: Patient is a 61 year old woman who is 6 weeks status post left above-knee amputation.  She is going to [facility] for her prosthetic she does have a shrinker.  Assessment & Plan: Visit Diagnoses:  1. Above knee amputation of left lower extremity ([place])     Plan: We will have her hold on her home health therapy continue with her exercises on her own and then we will set her up with outpatient therapy with Shirlean Mylar once she has her prosthetic leg.  Follow-Up Instructions: Return in about 3 weeks (around 05/25/2017).   Ortho Exam  Patient is alert, oriented, no adenopathy, well-dressed, normal affect, normal respiratory effort. Examination patient has a small area mid aspect of the incision with fibrinous tissue.  This measures about 10 mm in diameter.  There is no purulence no abscess no cellulitis she does have some mild tenderness.  Imaging: No results found. No images are attached to the encounter.  Labs: Lab Results  Component Value Date   HGBA1C 6.2 (H) 02/05/2017   HGBA1C 6.4 (H) 11/08/2015   HGBA1C 7.3 (H) 10/29/2015   ESRSEDRATE 102 (H) 10/29/2015   ESRSEDRATE 120 (H) 08/09/2014   CRP 1.9 (H) 03/12/2017   CRP 1.4 (H) 10/29/2015   CRP 1.2 (H) 06/29/2015   REPTSTATUS 02/10/2017 FINAL 02/08/2017   GRAMSTAIN  02/06/2017    FEW WBC PRESENT, PREDOMINANTLY MONONUCLEAR NO ORGANISMS SEEN    CULT >=100,000 COLONIES/mL YEAST (A) 02/08/2017   LABORGA NO GROWTH 02/03/2017    Orders:  No orders of the defined types were placed in this encounter.  No orders of the defined types were placed in this encounter.    Procedures: No procedures performed  Clinical Data: No additional findings.  ROS:  All other systems negative, except as noted in the HPI. Review of Systems  Objective: Vital Signs: Ht 5\' 7"  (1.702 m)   Wt 163 lb (73.9 kg)   LMP 03/16/2002   BMI 25.53 kg/m   Specialty Comments:  No specialty comments available.  PMFS History: Patient Active Problem List   Diagnosis Date Noted  . Pain   . Benign essential HTN   . Diabetes mellitus type 2 in obese ([place])   . Stage 5 chronic kidney disease not on chronic dialysis ([place])   . Above knee amputation of left lower extremity ([place]) 03/26/2017  . Above knee amputation status, left ([place])   . Acute blood loss anemia   . Chronic diastolic heart failure ([place])   . Post-operative pain   . Leukocytosis   . DM type 2 causing CKD stage 5 ([place]) 03/21/2017  . CKD (chronic kidney disease), stage V ([place]) 03/17/2017  . Wound dehiscence 03/10/2017  . Chronic pain of left knee 03/05/2017  . Fever 02/08/2017  . Infection of prosthetic left knee joint ([employer]) 02/07/2017  . AKI (acute kidney injury) ([place])   . Hemarthrosis   . Anemia of chronic disease 02/05/2017  .  Diabetes mellitus, type II ([place]) 02/05/2017  . Dysplasia of cervix, high grade CIN 2 01/12/2017  . ASCUS with positive high risk HPV cervical 12/09/2016  . Chronic diastolic CHF (congestive heart failure) ([place]) 11/08/2015  . Total knee replacement status   . Diabetic peripheral neuropathy ([place])   . Anxiety state   . Depression   . Normocytic anemia 11/07/2015  . Chronic kidney disease (CKD), stage IV (severe) ([place])   . Hepatitis C 08/16/2014  . Peripheral neuropathy 06/24/2013  . 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 Essential hypertension 02/18/2013   Past Medical History:  Diagnosis Date  . Anemia   . Anxiety   . Arthritis    "in my joints" (03/10/2017)  . Chronic diastolic CHF (congestive heart failure) ([place])   . CKD (chronic kidney disease), stage IV ([place])     stage IV. previous HD, none currently 10/30/15 (confirmed 02/05/2017 & 03/10/2017)  . Depression    Chronic  . Diabetic peripheral neuropathy ([place])   . Dysrhythmia    tachycardia, normal ECHO 08-09-14  . GERD (gastroesophageal reflux disease)   . Heart murmur     dx'd 02/05/2017  . Hepatitis C    "tx'd in 2016; I'm negative now" (02/05/2017)  . History of blood transfusion 2017   "w/knee replacement"  . Hypertension   . Osteoarthritis of left knee   . Peripheral neuropathy   . Protein calorie malnutrition ([place])   . Septic arthritis ([place])   . Slow transit constipation   . Type II diabetes mellitus ([name])    IDDM  . Uncontrolled hypertension 02/18/2013  . Unsteady gait     Family History  Problem Relation Age of Onset  . Cancer Mother   . Heart disease Father   . Cancer Sister   . Hypertension Sister   . Cancer Brother   . Diabetes Brother   . Hypertension Brother     Past Surgical History:  Procedure Laterality Date  . AND PARTIAL LATERAL MENISECTOMY Left 04/04/2015   Performed by Leandrew Koyanagi, MD at [facility]  . APPENDECTOMY    . ARTHROSCOPIC WASHOUT LEFT KNEE Left 08/19/2014   Performed by Marianna Payment, MD at [place]  . ARTHROSCOPIC WASHOUT LEFT KNEE Left 08/11/2014   Performed by Marianna Payment, MD at [place]  . ARTHROSCOPY I & D KNEE Right 08/10/2014   Performed by Marianna Payment, MD at [facility] ORS  . Brachial Artery to Brachial Vein Gortex Four - Seven Stretch GRAFT INSERTION Left Forearm Left 09/13/2014   Performed by Mal Misty, MD at [place]    . COLON SURGERY    . I & D BILATERAL SHOULDERS Bilateral 08/10/2014   Performed by Marianna Payment, MD at [facility] ORS  . Incisional total iknee with antibiotic spacer  Left 02/06/2017   Performed by Leandrew Koyanagi, MD at [place]  . IR FLUORO GUIDE CV LINE RIGHT  02/10/2017  . IR REMOVAL TUN CV CATH W/O FL  04/01/2017  . IR US GUIDE VASC ACCESS RIGHT  02/10/2017  . IRRIGATION AND  DEBRIDEMENT LEFT KNEE WITH WOUND VAC APPLICATION Left 08/14/6008   Performed by Leandrew Koyanagi, MD at [place]    . LEFT ABOVE KNEE AMPUTATION Left 03/20/2017   Performed by Newt Minion, MD at [place]  . LEFT KNEE ARTHROSCOPY WITH PARTIAL MEDIAL MENISCECTOMY  AND SYNOVECTOMY Left 04/04/2015  Performed by Leandrew Koyanagi, MD at [facility]  . LEFT TOTAL KNEE ARTHROPLASTY WITH REVISION OF IMPLANTS Left 11/07/2015   Performed by Leandrew Koyanagi, MD at [place], TENOSYNOVECTOMY Right 08/11/2014   Performed by Marianna Payment, MD at [place]  . SMALL INTESTINE SURGERY     Due to Small Bowel Obstruction; "fixed it when they did my gallbladder OR"  . TRANSESOPHAGEAL ECHOCARDIOGRAM (TEE) N/A 08/14/2014   Performed by Acie Fredrickson Wonda Cheng, MD at [place]  . TUBAL LIGATION     Social History   Occupational History  . Not on file  Tobacco Use  . Smoking status: Current Every Day Smoker    Packs/day: 0.33    Years: 35.00    Pack years: 11.55    Types: Cigarettes  . Smokeless tobacco: Never Used  Substance and Sexual Activity  . Alcohol use: No    Alcohol/week: 0.0 oz    Comment: 03/10/2017 "I drink a wine cooler a few times/year"  . Drug use: No  . Sexual activity: No

## 2017-05-06 DIAGNOSIS — E1142 Type 2 diabetes mellitus with diabetic polyneuropathy: Secondary | ICD-10-CM | POA: Diagnosis not present

## 2017-05-06 DIAGNOSIS — I5032 Chronic diastolic (congestive) heart failure: Secondary | ICD-10-CM | POA: Diagnosis not present

## 2017-05-06 DIAGNOSIS — N185 Chronic kidney disease, stage 5: Secondary | ICD-10-CM | POA: Diagnosis not present

## 2017-05-06 DIAGNOSIS — I132 Hypertensive heart and chronic kidney disease with heart failure and with stage 5 chronic kidney disease, or end stage renal disease: Secondary | ICD-10-CM | POA: Diagnosis not present

## 2017-05-06 DIAGNOSIS — T8781 Dehiscence of amputation stump: Secondary | ICD-10-CM | POA: Diagnosis not present

## 2017-05-06 DIAGNOSIS — E1122 Type 2 diabetes mellitus with diabetic chronic kidney disease: Secondary | ICD-10-CM | POA: Diagnosis not present

## 2017-05-08 DIAGNOSIS — I5032 Chronic diastolic (congestive) heart failure: Secondary | ICD-10-CM | POA: Diagnosis not present

## 2017-05-08 DIAGNOSIS — T8781 Dehiscence of amputation stump: Secondary | ICD-10-CM | POA: Diagnosis not present

## 2017-05-08 DIAGNOSIS — E1122 Type 2 diabetes mellitus with diabetic chronic kidney disease: Secondary | ICD-10-CM | POA: Diagnosis not present

## 2017-05-08 DIAGNOSIS — N185 Chronic kidney disease, stage 5: Secondary | ICD-10-CM | POA: Diagnosis not present

## 2017-05-08 DIAGNOSIS — I132 Hypertensive heart and chronic kidney disease with heart failure and with stage 5 chronic kidney disease, or end stage renal disease: Secondary | ICD-10-CM | POA: Diagnosis not present

## 2017-05-08 DIAGNOSIS — E1142 Type 2 diabetes mellitus with diabetic polyneuropathy: Secondary | ICD-10-CM | POA: Diagnosis not present

## 2017-05-11 DIAGNOSIS — T8781 Dehiscence of amputation stump: Secondary | ICD-10-CM | POA: Diagnosis not present

## 2017-05-11 DIAGNOSIS — I132 Hypertensive heart and chronic kidney disease with heart failure and with stage 5 chronic kidney disease, or end stage renal disease: Secondary | ICD-10-CM | POA: Diagnosis not present

## 2017-05-11 DIAGNOSIS — I5032 Chronic diastolic (congestive) heart failure: Secondary | ICD-10-CM | POA: Diagnosis not present

## 2017-05-11 DIAGNOSIS — N185 Chronic kidney disease, stage 5: Secondary | ICD-10-CM | POA: Diagnosis not present

## 2017-05-11 DIAGNOSIS — E1122 Type 2 diabetes mellitus with diabetic chronic kidney disease: Secondary | ICD-10-CM | POA: Diagnosis not present

## 2017-05-11 DIAGNOSIS — E1142 Type 2 diabetes mellitus with diabetic polyneuropathy: Secondary | ICD-10-CM | POA: Diagnosis not present

## 2017-05-12 ENCOUNTER — Encounter (HOSPITAL_COMMUNITY)
Admission: RE | Admit: 2017-05-12 | Discharge: 2017-05-12 | Disposition: A | Discharge status: Home / Self Care | Payer: Medicare Other | Source: Ambulatory Visit | Attending: Nephrology | Admitting: Nephrology

## 2017-05-12 VITALS — BP 184/76 | HR 77 | Temp 97.7°F | Resp 16 | Wt 130.0 lb

## 2017-05-12 DIAGNOSIS — N184 Chronic kidney disease, stage 4 (severe): Secondary | ICD-10-CM | POA: Diagnosis not present

## 2017-05-12 DIAGNOSIS — D631 Anemia in chronic kidney disease: Secondary | ICD-10-CM | POA: Insufficient documentation

## 2017-05-12 LAB — FERRITIN: Ferritin: 1036 ng/mL — ABNORMAL HIGH (ref 11–307)

## 2017-05-12 LAB — IRON AND TIBC
Iron: 76 ug/dL (ref 28–170)
Saturation Ratios: 26 % (ref 10.4–31.8)
TIBC: 288 ug/dL (ref 250–450)
UIBC: 212 ug/dL

## 2017-05-12 MED ORDER — DARBEPOETIN ALFA 200 MCG/0.4ML IJ SOSY: 200.0000 ug | PREFILLED_SYRINGE | INTRAMUSCULAR | Status: DC

## 2017-05-13 LAB — POCT HEMOGLOBIN-HEMACUE: Hemoglobin: 12.9 g/dL (ref 12.0–15.0)

## 2017-05-15 DIAGNOSIS — N185 Chronic kidney disease, stage 5: Secondary | ICD-10-CM | POA: Diagnosis not present

## 2017-05-15 DIAGNOSIS — E1122 Type 2 diabetes mellitus with diabetic chronic kidney disease: Secondary | ICD-10-CM | POA: Diagnosis not present

## 2017-05-15 DIAGNOSIS — T8781 Dehiscence of amputation stump: Secondary | ICD-10-CM | POA: Diagnosis not present

## 2017-05-15 DIAGNOSIS — I132 Hypertensive heart and chronic kidney disease with heart failure and with stage 5 chronic kidney disease, or end stage renal disease: Secondary | ICD-10-CM | POA: Diagnosis not present

## 2017-05-15 DIAGNOSIS — I5032 Chronic diastolic (congestive) heart failure: Secondary | ICD-10-CM | POA: Diagnosis not present

## 2017-05-15 DIAGNOSIS — E1142 Type 2 diabetes mellitus with diabetic polyneuropathy: Secondary | ICD-10-CM | POA: Diagnosis not present

## 2017-05-18 DIAGNOSIS — I5032 Chronic diastolic (congestive) heart failure: Secondary | ICD-10-CM | POA: Diagnosis not present

## 2017-05-18 DIAGNOSIS — E1142 Type 2 diabetes mellitus with diabetic polyneuropathy: Secondary | ICD-10-CM | POA: Diagnosis not present

## 2017-05-18 DIAGNOSIS — I132 Hypertensive heart and chronic kidney disease with heart failure and with stage 5 chronic kidney disease, or end stage renal disease: Secondary | ICD-10-CM | POA: Diagnosis not present

## 2017-05-18 DIAGNOSIS — T8781 Dehiscence of amputation stump: Secondary | ICD-10-CM | POA: Diagnosis not present

## 2017-05-18 DIAGNOSIS — E1122 Type 2 diabetes mellitus with diabetic chronic kidney disease: Secondary | ICD-10-CM | POA: Diagnosis not present

## 2017-05-18 DIAGNOSIS — N185 Chronic kidney disease, stage 5: Secondary | ICD-10-CM | POA: Diagnosis not present

## 2017-05-20 DIAGNOSIS — I132 Hypertensive heart and chronic kidney disease with heart failure and with stage 5 chronic kidney disease, or end stage renal disease: Secondary | ICD-10-CM | POA: Diagnosis not present

## 2017-05-20 DIAGNOSIS — N185 Chronic kidney disease, stage 5: Secondary | ICD-10-CM | POA: Diagnosis not present

## 2017-05-20 DIAGNOSIS — I5032 Chronic diastolic (congestive) heart failure: Secondary | ICD-10-CM | POA: Diagnosis not present

## 2017-05-20 DIAGNOSIS — T8781 Dehiscence of amputation stump: Secondary | ICD-10-CM | POA: Diagnosis not present

## 2017-05-20 DIAGNOSIS — E1142 Type 2 diabetes mellitus with diabetic polyneuropathy: Secondary | ICD-10-CM | POA: Diagnosis not present

## 2017-05-20 DIAGNOSIS — E1122 Type 2 diabetes mellitus with diabetic chronic kidney disease: Secondary | ICD-10-CM | POA: Diagnosis not present

## 2017-05-22 ENCOUNTER — Encounter: Payer: Medicare Other | Admitting: Physical Medicine & Rehabilitation

## 2017-05-28 ENCOUNTER — Ambulatory Visit (INDEPENDENT_AMBULATORY_CARE_PROVIDER_SITE_OTHER): Payer: Medicare Other | Admitting: Orthopedic Surgery

## 2017-05-29 ENCOUNTER — Encounter: Payer: Medicare Other | Attending: Physical Medicine & Rehabilitation | Admitting: Physical Medicine & Rehabilitation

## 2017-05-29 DIAGNOSIS — F419 Anxiety disorder, unspecified: Secondary | ICD-10-CM | POA: Insufficient documentation

## 2017-05-29 DIAGNOSIS — M009 Pyogenic arthritis, unspecified: Secondary | ICD-10-CM | POA: Insufficient documentation

## 2017-05-29 DIAGNOSIS — F329 Major depressive disorder, single episode, unspecified: Secondary | ICD-10-CM | POA: Insufficient documentation

## 2017-05-29 DIAGNOSIS — Z89612 Acquired absence of left leg above knee: Secondary | ICD-10-CM | POA: Insufficient documentation

## 2017-05-29 DIAGNOSIS — E1122 Type 2 diabetes mellitus with diabetic chronic kidney disease: Secondary | ICD-10-CM | POA: Insufficient documentation

## 2017-05-29 DIAGNOSIS — R000 Tachycardia, unspecified: Secondary | ICD-10-CM | POA: Insufficient documentation

## 2017-05-29 DIAGNOSIS — E1142 Type 2 diabetes mellitus with diabetic polyneuropathy: Secondary | ICD-10-CM | POA: Insufficient documentation

## 2017-05-29 DIAGNOSIS — F17210 Nicotine dependence, cigarettes, uncomplicated: Secondary | ICD-10-CM | POA: Insufficient documentation

## 2017-05-29 DIAGNOSIS — I132 Hypertensive heart and chronic kidney disease with heart failure and with stage 5 chronic kidney disease, or end stage renal disease: Secondary | ICD-10-CM | POA: Insufficient documentation

## 2017-05-29 DIAGNOSIS — K219 Gastro-esophageal reflux disease without esophagitis: Secondary | ICD-10-CM | POA: Insufficient documentation

## 2017-05-29 DIAGNOSIS — R2689 Other abnormalities of gait and mobility: Secondary | ICD-10-CM | POA: Insufficient documentation

## 2017-05-29 DIAGNOSIS — N185 Chronic kidney disease, stage 5: Secondary | ICD-10-CM | POA: Insufficient documentation

## 2017-05-29 DIAGNOSIS — B1920 Unspecified viral hepatitis C without hepatic coma: Secondary | ICD-10-CM | POA: Insufficient documentation

## 2017-05-29 DIAGNOSIS — Z794 Long term (current) use of insulin: Secondary | ICD-10-CM | POA: Insufficient documentation

## 2017-05-29 DIAGNOSIS — I5032 Chronic diastolic (congestive) heart failure: Secondary | ICD-10-CM | POA: Insufficient documentation

## 2017-05-29 DIAGNOSIS — D638 Anemia in other chronic diseases classified elsewhere: Secondary | ICD-10-CM | POA: Insufficient documentation

## 2017-06-01 DIAGNOSIS — E1142 Type 2 diabetes mellitus with diabetic polyneuropathy: Secondary | ICD-10-CM | POA: Diagnosis not present

## 2017-06-01 DIAGNOSIS — E1122 Type 2 diabetes mellitus with diabetic chronic kidney disease: Secondary | ICD-10-CM | POA: Diagnosis not present

## 2017-06-01 DIAGNOSIS — I5032 Chronic diastolic (congestive) heart failure: Secondary | ICD-10-CM | POA: Diagnosis not present

## 2017-06-01 DIAGNOSIS — I132 Hypertensive heart and chronic kidney disease with heart failure and with stage 5 chronic kidney disease, or end stage renal disease: Secondary | ICD-10-CM | POA: Diagnosis not present

## 2017-06-01 DIAGNOSIS — N185 Chronic kidney disease, stage 5: Secondary | ICD-10-CM | POA: Diagnosis not present

## 2017-06-01 DIAGNOSIS — T8781 Dehiscence of amputation stump: Secondary | ICD-10-CM | POA: Diagnosis not present

## 2017-06-05 DIAGNOSIS — I5032 Chronic diastolic (congestive) heart failure: Secondary | ICD-10-CM | POA: Diagnosis not present

## 2017-06-05 DIAGNOSIS — E1142 Type 2 diabetes mellitus with diabetic polyneuropathy: Secondary | ICD-10-CM | POA: Diagnosis not present

## 2017-06-05 DIAGNOSIS — T8781 Dehiscence of amputation stump: Secondary | ICD-10-CM | POA: Diagnosis not present

## 2017-06-05 DIAGNOSIS — I132 Hypertensive heart and chronic kidney disease with heart failure and with stage 5 chronic kidney disease, or end stage renal disease: Secondary | ICD-10-CM | POA: Diagnosis not present

## 2017-06-05 DIAGNOSIS — E1122 Type 2 diabetes mellitus with diabetic chronic kidney disease: Secondary | ICD-10-CM | POA: Diagnosis not present

## 2017-06-05 DIAGNOSIS — N185 Chronic kidney disease, stage 5: Secondary | ICD-10-CM | POA: Diagnosis not present

## 2017-06-07 DIAGNOSIS — E1122 Type 2 diabetes mellitus with diabetic chronic kidney disease: Secondary | ICD-10-CM | POA: Diagnosis not present

## 2017-06-07 DIAGNOSIS — I132 Hypertensive heart and chronic kidney disease with heart failure and with stage 5 chronic kidney disease, or end stage renal disease: Secondary | ICD-10-CM | POA: Diagnosis not present

## 2017-06-07 DIAGNOSIS — E1142 Type 2 diabetes mellitus with diabetic polyneuropathy: Secondary | ICD-10-CM | POA: Diagnosis not present

## 2017-06-07 DIAGNOSIS — I5032 Chronic diastolic (congestive) heart failure: Secondary | ICD-10-CM | POA: Diagnosis not present

## 2017-06-07 DIAGNOSIS — T8781 Dehiscence of amputation stump: Secondary | ICD-10-CM | POA: Diagnosis not present

## 2017-06-07 DIAGNOSIS — N185 Chronic kidney disease, stage 5: Secondary | ICD-10-CM | POA: Diagnosis not present

## 2017-06-08 DIAGNOSIS — T8781 Dehiscence of amputation stump: Secondary | ICD-10-CM | POA: Diagnosis not present

## 2017-06-08 DIAGNOSIS — N185 Chronic kidney disease, stage 5: Secondary | ICD-10-CM | POA: Diagnosis not present

## 2017-06-08 DIAGNOSIS — I5032 Chronic diastolic (congestive) heart failure: Secondary | ICD-10-CM | POA: Diagnosis not present

## 2017-06-08 DIAGNOSIS — I132 Hypertensive heart and chronic kidney disease with heart failure and with stage 5 chronic kidney disease, or end stage renal disease: Secondary | ICD-10-CM | POA: Diagnosis not present

## 2017-06-08 DIAGNOSIS — E1122 Type 2 diabetes mellitus with diabetic chronic kidney disease: Secondary | ICD-10-CM | POA: Diagnosis not present

## 2017-06-08 DIAGNOSIS — E1142 Type 2 diabetes mellitus with diabetic polyneuropathy: Secondary | ICD-10-CM | POA: Diagnosis not present

## 2017-06-10 DIAGNOSIS — E1142 Type 2 diabetes mellitus with diabetic polyneuropathy: Secondary | ICD-10-CM | POA: Diagnosis not present

## 2017-06-10 DIAGNOSIS — E1122 Type 2 diabetes mellitus with diabetic chronic kidney disease: Secondary | ICD-10-CM | POA: Diagnosis not present

## 2017-06-10 DIAGNOSIS — I5032 Chronic diastolic (congestive) heart failure: Secondary | ICD-10-CM | POA: Diagnosis not present

## 2017-06-10 DIAGNOSIS — T8781 Dehiscence of amputation stump: Secondary | ICD-10-CM | POA: Diagnosis not present

## 2017-06-10 DIAGNOSIS — N185 Chronic kidney disease, stage 5: Secondary | ICD-10-CM | POA: Diagnosis not present

## 2017-06-10 DIAGNOSIS — I132 Hypertensive heart and chronic kidney disease with heart failure and with stage 5 chronic kidney disease, or end stage renal disease: Secondary | ICD-10-CM | POA: Diagnosis not present

## 2017-06-15 DIAGNOSIS — T8781 Dehiscence of amputation stump: Secondary | ICD-10-CM | POA: Diagnosis not present

## 2017-06-15 DIAGNOSIS — I132 Hypertensive heart and chronic kidney disease with heart failure and with stage 5 chronic kidney disease, or end stage renal disease: Secondary | ICD-10-CM | POA: Diagnosis not present

## 2017-06-15 DIAGNOSIS — E1142 Type 2 diabetes mellitus with diabetic polyneuropathy: Secondary | ICD-10-CM | POA: Diagnosis not present

## 2017-06-15 DIAGNOSIS — I5032 Chronic diastolic (congestive) heart failure: Secondary | ICD-10-CM | POA: Diagnosis not present

## 2017-06-15 DIAGNOSIS — N185 Chronic kidney disease, stage 5: Secondary | ICD-10-CM | POA: Diagnosis not present

## 2017-06-15 DIAGNOSIS — E1122 Type 2 diabetes mellitus with diabetic chronic kidney disease: Secondary | ICD-10-CM | POA: Diagnosis not present

## 2017-06-18 ENCOUNTER — Encounter (INDEPENDENT_AMBULATORY_CARE_PROVIDER_SITE_OTHER): Payer: Self-pay | Admitting: Orthopedic Surgery

## 2017-06-18 ENCOUNTER — Ambulatory Visit (INDEPENDENT_AMBULATORY_CARE_PROVIDER_SITE_OTHER): Payer: Medicare Other | Admitting: Orthopedic Surgery

## 2017-06-18 DIAGNOSIS — IMO0002 Reserved for concepts with insufficient information to code with codable children: Secondary | ICD-10-CM

## 2017-06-18 DIAGNOSIS — Z89612 Acquired absence of left leg above knee: Secondary | ICD-10-CM

## 2017-06-18 DIAGNOSIS — S78112A Complete traumatic amputation at level between left hip and knee, initial encounter: Secondary | ICD-10-CM

## 2017-06-18 MED ORDER — HYDROCODONE-ACETAMINOPHEN 5-325 MG PO TABS: 1.0000 | ORAL_TABLET | Freq: Four times a day (QID) | ORAL | 0 refills | Status: DC | PRN

## 2017-06-18 MED ORDER — DOXYCYCLINE HYCLATE 100 MG PO CAPS: 100.0000 mg | ORAL_CAPSULE | Freq: Two times a day (BID) | ORAL | 0 refills | Status: DC

## 2017-06-18 NOTE — Progress Notes (Signed)
Office Visit Note   Patient: Brandy Houston           Date of Birth: 02/06/1956           MRN: [id number] Visit Date: 06/18/2017              Requested by: [name], [address] [place], [place] [address] PCP: [name], MD  Chief Complaint  Patient presents with  . 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  She is going to [facility] for her prosthetic needs.  she does have a shrinker.  Continues to have open area centrally with drainage. This has been increasingly painful, encompassing thigh. No warmth, erythema or fevers.   Assessment & Plan: Visit Diagnoses:  No diagnosis found.  Plan: two weeks of Doxycycline. Continue daily wound care. [facility] assisting with wound care. Wear shrinker daily. Discussed possibility of revision surgery in future if wound not improving.   Follow-Up Instructions: No Follow-up on file.   Ortho Exam  Patient is alert, oriented, no adenopathy, well-dressed, normal affect, normal respiratory effort. Examination patient has a small area mid aspect of the incision with fibrinous tissue.  This measures about 5 mm in diameter. Does probe 2 cm deep. Does not probe to bone. There is no purulence no abscess no cellulitis she does have some mild tenderness.  Imaging: No results found. No images are attached to the encounter.  Labs: Lab Results  Component Value Date   HGBA1C 6.2 (H) 02/05/2017   HGBA1C 6.4 (H) 11/08/2015   HGBA1C 7.3 (H) 10/29/2015   ESRSEDRATE 102 (H) 10/29/2015   ESRSEDRATE 120 (H) 08/09/2014   CRP 1.9 (H) 03/12/2017   CRP 1.4 (H) 10/29/2015   CRP 1.2 (H) 06/29/2015   REPTSTATUS 02/10/2017 FINAL 02/08/2017   GRAMSTAIN  02/06/2017    FEW WBC PRESENT, PREDOMINANTLY MONONUCLEAR NO ORGANISMS SEEN    CULT >=100,000 COLONIES/mL YEAST (A) 02/08/2017   LABORGA NO GROWTH 02/03/2017    Orders:  No orders of the defined  types were placed in this encounter.  Meds ordered this encounter  Medications  . doxycycline (VIBRAMYCIN) 100 MG capsule    Sig: Take 1 capsule (100 mg total) by mouth 2 (two) times daily.    Dispense:  28 capsule    Refill:  0  . HYDROcodone-acetaminophen (NORCO/VICODIN) 5-325 MG tablet    Sig: Take 1 tablet by mouth every 6 (six) hours as needed for moderate pain.    Dispense:  30 tablet    Refill:  0     Procedures: No procedures performed  Clinical Data: No additional findings.  ROS:  All other systems negative, except as noted in the HPI. Review of Systems  Constitutional: Negative for chills and fever.  Musculoskeletal: Positive for myalgias.  Skin: Positive for wound. Negative for color change.    Objective: Vital Signs: LMP 03/16/2002   Specialty Comments:  No specialty comments available.  PMFS History: Patient Active Problem List   Diagnosis Date Noted  . Pain   . Benign essential HTN   . Diabetes mellitus type 2 in obese ([place])   . Stage 5 chronic kidney disease not on chronic dialysis ([place])   . Above knee amputation of left lower extremity ([place]) 03/26/2017  . Above knee amputation status, left ([place])   . Acute blood loss anemia   .  Chronic diastolic heart failure ([place])   . Post-operative pain   . Leukocytosis   . DM type 2 causing CKD stage 5 ([place]) 03/21/2017  . CKD (chronic kidney disease), stage V ([place]) 03/17/2017  . Wound dehiscence 03/10/2017  . Chronic pain of left knee 03/05/2017  . Fever 02/08/2017  . Infection of prosthetic left knee joint ([place]) 02/07/2017  . AKI (acute kidney injury) ([place])   . Hemarthrosis   . Anemia of chronic disease 02/05/2017  . Diabetes mellitus, type II ([place]) 02/05/2017  . Dysplasia of cervix, high grade CIN 2 01/12/2017  . ASCUS with positive high risk HPV cervical 12/09/2016  . Chronic diastolic CHF (congestive heart failure) ([place]) 11/08/2015  . Total knee replacement status   . Diabetic peripheral neuropathy  ([place])   . Anxiety state   . Depression   . Normocytic anemia 11/07/2015  . Chronic kidney disease (CKD), stage IV (severe) ([place])   . Hepatitis C 08/16/2014  . Peripheral neuropathy 06/24/2013  . 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 Essential hypertension 02/18/2013   Past Medical History:  Diagnosis Date  . Anemia   . Anxiety   . Arthritis    "in my joints" (03/10/2017)  . Chronic diastolic CHF (congestive heart failure) ([place])   . CKD (chronic kidney disease), stage IV ([place])    stage IV. previous HD, none currently 10/30/15 (confirmed 02/05/2017 & 03/10/2017)  . Depression    Chronic  . Diabetic peripheral neuropathy ([place])   . Dysrhythmia    tachycardia, normal ECHO 08-09-14  . GERD (gastroesophageal reflux disease)   . Heart murmur     dx'd 02/05/2017  . Hepatitis C    "tx'd in 2016; I'm negative now" (02/05/2017)  . History of blood transfusion 2017   "w/knee replacement"  . Hypertension   . Osteoarthritis of left knee   . Peripheral neuropathy   . Protein calorie malnutrition ([place])   . Septic arthritis ([place])   . Slow transit constipation   . Type II diabetes mellitus ([name])    IDDM  . Uncontrolled hypertension 02/18/2013  . Unsteady gait     Family History  Problem Relation Age of Onset  . Cancer Mother   . Heart disease Father   . Cancer Sister   . Hypertension Sister   . Cancer Brother   . Diabetes Brother   . Hypertension Brother     Past Surgical History:  Procedure Laterality Date  . AMPUTATION Left 03/20/2017   Procedure: LEFT ABOVE KNEE AMPUTATION;  Surgeon: Newt Minion, MD;  Location: [facility];  Service: Orthopedics;  Laterality: Left;  . APPENDECTOMY    . AV FISTULA PLACEMENT Left 09/13/2014   Procedure: Brachial Artery to Brachial Vein Gortex Four - Seven Stretch GRAFT INSERTION Left Forearm;  Surgeon: Mal Misty, MD;  Location: [facility];  Service: Vascular;  Laterality: Left;  . CHOLECYSTECTOMY OPEN    . COLON SURGERY    . EXCISIONAL TOTAL  KNEE ARTHROPLASTY WITH ANTIBIOTIC SPACERS Left 02/06/2017   Procedure: Incisional total iknee with antibiotic spacer ;  Surgeon: Leandrew Koyanagi, MD;  Location: [facility];  Service: Orthopedics;  Laterality: Left;  . IR FLUORO GUIDE CV LINE RIGHT  02/10/2017  . IR REMOVAL TUN CV CATH W/O FL  04/01/2017  . IR US GUIDE VASC ACCESS RIGHT  02/10/2017  . IRRIGATION AND DEBRIDEMENT KNEE Left 03/12/2017   Procedure: IRRIGATION AND DEBRIDEMENT LEFT KNEE WITH WOUND VAC APPLICATION;  Surgeon: Leandrew Koyanagi, MD;  Location: [facility];  Service: Orthopedics;  Laterality: Left;  . JOINT REPLACEMENT    . KNEE ARTHROSCOPY Right 08/10/2014   Procedure: ARTHROSCOPY I & D KNEE;  Surgeon: Marianna Payment, MD;  [facility];  Service: Orthopedics;  Laterality: Right;  . KNEE ARTHROSCOPY Left 08/11/2014   Procedure: ARTHROSCOPIC WASHOUT LEFT KNEE;  Surgeon: Marianna Payment, MD;  Location: [facility];  Service: Orthopedics;  Laterality: Left;  . KNEE ARTHROSCOPY Left 08/19/2014   Procedure: ARTHROSCOPIC WASHOUT LEFT KNEE;  Surgeon: Leandrew Koyanagi, MD;  Location: [facility];  Service: Orthopedics;  Laterality: Left;  . KNEE ARTHROSCOPY WITH LATERAL MENISECTOMY Left 04/04/2015   Procedure: AND PARTIAL LATERAL MENISECTOMY;  Surgeon: Leandrew Koyanagi, MD;  Location: [facility];  Service: Orthopedics;  Laterality: Left;  . KNEE ARTHROSCOPY WITH MEDIAL MENISECTOMY Left 04/04/2015   Procedure: LEFT KNEE ARTHROSCOPY WITH PARTIAL MEDIAL MENISCECTOMY  AND SYNOVECTOMY;  Surgeon: Leandrew Koyanagi, MD;  Location: [facility];  Service: Orthopedics;  Laterality: Left;  . SHOULDER ARTHROSCOPY Bilateral 08/10/2014   Procedure: I & D BILATERAL SHOULDERS ;  Surgeon: Marianna Payment, MD;  [facility];  Service: Orthopedics;  Laterality: Bilateral;  . SMALL INTESTINE SURGERY     Due to Small Bowel Obstruction; "fixed it when they did my gallbladder OR"  . TEE WITHOUT CARDIOVERSION N/A 08/14/2014   Procedure: TRANSESOPHAGEAL  ECHOCARDIOGRAM (TEE);  Surgeon: Thayer Headings, MD;  Location: [place] [facility];  Service: Cardiovascular;  Laterality: N/A;  . TENOSYNOVECTOMY Right 08/11/2014   Procedure: RIGHT WRIST IRRIGATION AND DEBRIDEMENT, TENOSYNOVECTOMY;  Surgeon: Marianna Payment, MD;  Location: [facility];  Service: Orthopedics;  Laterality: Right;  . TOTAL KNEE ARTHROPLASTY Left 11/07/2015   Procedure: LEFT TOTAL KNEE ARTHROPLASTY WITH REVISION OF IMPLANTS;  Surgeon: Leandrew Koyanagi, MD;  Location: [place];  Service: Orthopedics;  Laterality: Left;  . TUBAL LIGATION     Social History   Occupational History  . Not on file  Tobacco Use  . Smoking status: Current Every Day Smoker    Packs/day: 0.33    Years: 35.00    Pack years: 11.55    Types: Cigarettes  . Smokeless tobacco: Never Used  Substance and Sexual Activity  . Alcohol use: No    Alcohol/week: 0.0 oz    Comment: 03/10/2017 "I drink a wine cooler a few times/year"  . Drug use: No  . Sexual activity: No

## 2017-06-19 DIAGNOSIS — I132 Hypertensive heart and chronic kidney disease with heart failure and with stage 5 chronic kidney disease, or end stage renal disease: Secondary | ICD-10-CM | POA: Diagnosis not present

## 2017-06-19 DIAGNOSIS — E1122 Type 2 diabetes mellitus with diabetic chronic kidney disease: Secondary | ICD-10-CM | POA: Diagnosis not present

## 2017-06-19 DIAGNOSIS — I5032 Chronic diastolic (congestive) heart failure: Secondary | ICD-10-CM | POA: Diagnosis not present

## 2017-06-19 DIAGNOSIS — E1142 Type 2 diabetes mellitus with diabetic polyneuropathy: Secondary | ICD-10-CM | POA: Diagnosis not present

## 2017-06-19 DIAGNOSIS — T8781 Dehiscence of amputation stump: Secondary | ICD-10-CM | POA: Diagnosis not present

## 2017-06-19 DIAGNOSIS — N185 Chronic kidney disease, stage 5: Secondary | ICD-10-CM | POA: Diagnosis not present

## 2017-06-22 DIAGNOSIS — E1122 Type 2 diabetes mellitus with diabetic chronic kidney disease: Secondary | ICD-10-CM | POA: Diagnosis not present

## 2017-06-22 DIAGNOSIS — T8781 Dehiscence of amputation stump: Secondary | ICD-10-CM | POA: Diagnosis not present

## 2017-06-22 DIAGNOSIS — I132 Hypertensive heart and chronic kidney disease with heart failure and with stage 5 chronic kidney disease, or end stage renal disease: Secondary | ICD-10-CM | POA: Diagnosis not present

## 2017-06-22 DIAGNOSIS — N185 Chronic kidney disease, stage 5: Secondary | ICD-10-CM | POA: Diagnosis not present

## 2017-06-22 DIAGNOSIS — E1142 Type 2 diabetes mellitus with diabetic polyneuropathy: Secondary | ICD-10-CM | POA: Diagnosis not present

## 2017-06-22 DIAGNOSIS — I5032 Chronic diastolic (congestive) heart failure: Secondary | ICD-10-CM | POA: Diagnosis not present

## 2017-06-26 DIAGNOSIS — E1122 Type 2 diabetes mellitus with diabetic chronic kidney disease: Secondary | ICD-10-CM | POA: Diagnosis not present

## 2017-06-26 DIAGNOSIS — Z720 Tobacco use: Secondary | ICD-10-CM | POA: Diagnosis not present

## 2017-06-26 DIAGNOSIS — N185 Chronic kidney disease, stage 5: Secondary | ICD-10-CM | POA: Diagnosis not present

## 2017-06-26 DIAGNOSIS — T8781 Dehiscence of amputation stump: Secondary | ICD-10-CM | POA: Diagnosis not present

## 2017-06-26 DIAGNOSIS — E1142 Type 2 diabetes mellitus with diabetic polyneuropathy: Secondary | ICD-10-CM | POA: Diagnosis not present

## 2017-06-26 DIAGNOSIS — I5032 Chronic diastolic (congestive) heart failure: Secondary | ICD-10-CM | POA: Diagnosis not present

## 2017-06-26 DIAGNOSIS — N049 Nephrotic syndrome with unspecified morphologic changes: Secondary | ICD-10-CM | POA: Diagnosis not present

## 2017-06-26 DIAGNOSIS — I129 Hypertensive chronic kidney disease with stage 1 through stage 4 chronic kidney disease, or unspecified chronic kidney disease: Secondary | ICD-10-CM | POA: Diagnosis not present

## 2017-06-26 DIAGNOSIS — I132 Hypertensive heart and chronic kidney disease with heart failure and with stage 5 chronic kidney disease, or end stage renal disease: Secondary | ICD-10-CM | POA: Diagnosis not present

## 2017-06-26 DIAGNOSIS — D631 Anemia in chronic kidney disease: Secondary | ICD-10-CM | POA: Diagnosis not present

## 2017-06-26 DIAGNOSIS — N184 Chronic kidney disease, stage 4 (severe): Secondary | ICD-10-CM | POA: Diagnosis not present

## 2017-06-26 DIAGNOSIS — E1129 Type 2 diabetes mellitus with other diabetic kidney complication: Secondary | ICD-10-CM | POA: Diagnosis not present

## 2017-06-26 DIAGNOSIS — N2581 Secondary hyperparathyroidism of renal origin: Secondary | ICD-10-CM | POA: Diagnosis not present

## 2017-06-26 LAB — HEMOGLOBIN A1C: Hemoglobin A1C: 5.3

## 2017-06-26 LAB — BASIC METABOLIC PANEL
BUN: 90 — AB (ref 4–21)
Creatinine: 4.4 — AB (ref 0.5–1.1)
Glucose: 97
Potassium: 3.8 (ref 3.4–5.3)
Sodium: 137 (ref 137–147)

## 2017-06-26 LAB — CBC AND DIFFERENTIAL
HCT: 27 — AB (ref 36–46)
Hemoglobin: 9.2 — AB (ref 12.0–16.0)
Neutrophils Absolute: 5
Platelets: 329 (ref 150–399)
WBC: 7

## 2017-06-29 ENCOUNTER — Telehealth (INDEPENDENT_AMBULATORY_CARE_PROVIDER_SITE_OTHER): Payer: Self-pay | Admitting: Radiology

## 2017-06-29 DIAGNOSIS — T8781 Dehiscence of amputation stump: Secondary | ICD-10-CM | POA: Diagnosis not present

## 2017-06-29 DIAGNOSIS — E1122 Type 2 diabetes mellitus with diabetic chronic kidney disease: Secondary | ICD-10-CM | POA: Diagnosis not present

## 2017-06-29 DIAGNOSIS — I132 Hypertensive heart and chronic kidney disease with heart failure and with stage 5 chronic kidney disease, or end stage renal disease: Secondary | ICD-10-CM | POA: Diagnosis not present

## 2017-06-29 DIAGNOSIS — N185 Chronic kidney disease, stage 5: Secondary | ICD-10-CM | POA: Diagnosis not present

## 2017-06-29 DIAGNOSIS — E1142 Type 2 diabetes mellitus with diabetic polyneuropathy: Secondary | ICD-10-CM | POA: Diagnosis not present

## 2017-06-29 DIAGNOSIS — I5032 Chronic diastolic (congestive) heart failure: Secondary | ICD-10-CM | POA: Diagnosis not present

## 2017-06-29 NOTE — Telephone Encounter (Signed)
Tiffany from [facility] calling wanting verbal ok to start silver alginate dressing so they can continue seeing patient, she is having drainage. Advised this is ok per MD.

## 2017-07-01 DIAGNOSIS — I132 Hypertensive heart and chronic kidney disease with heart failure and with stage 5 chronic kidney disease, or end stage renal disease: Secondary | ICD-10-CM | POA: Diagnosis not present

## 2017-07-01 DIAGNOSIS — T8781 Dehiscence of amputation stump: Secondary | ICD-10-CM | POA: Diagnosis not present

## 2017-07-01 DIAGNOSIS — I5032 Chronic diastolic (congestive) heart failure: Secondary | ICD-10-CM | POA: Diagnosis not present

## 2017-07-01 DIAGNOSIS — E1142 Type 2 diabetes mellitus with diabetic polyneuropathy: Secondary | ICD-10-CM | POA: Diagnosis not present

## 2017-07-01 DIAGNOSIS — N185 Chronic kidney disease, stage 5: Secondary | ICD-10-CM | POA: Diagnosis not present

## 2017-07-01 DIAGNOSIS — E1122 Type 2 diabetes mellitus with diabetic chronic kidney disease: Secondary | ICD-10-CM | POA: Diagnosis not present

## 2017-07-02 ENCOUNTER — Encounter (INDEPENDENT_AMBULATORY_CARE_PROVIDER_SITE_OTHER): Payer: Self-pay | Admitting: Orthopedic Surgery

## 2017-07-02 ENCOUNTER — Ambulatory Visit (INDEPENDENT_AMBULATORY_CARE_PROVIDER_SITE_OTHER): Payer: Medicare Other | Admitting: Orthopedic Surgery

## 2017-07-02 VITALS — Ht 67.0 in | Wt 130.0 lb

## 2017-07-02 DIAGNOSIS — Z89612 Acquired absence of left leg above knee: Secondary | ICD-10-CM

## 2017-07-02 DIAGNOSIS — IMO0002 Reserved for concepts with insufficient information to code with codable children: Secondary | ICD-10-CM

## 2017-07-02 MED ORDER — DOXYCYCLINE HYCLATE 100 MG PO TABS: 100.0000 mg | ORAL_TABLET | Freq: Two times a day (BID) | ORAL | 0 refills | Status: DC

## 2017-07-02 NOTE — Progress Notes (Signed)
Office Visit Note   Patient: Brandy Houston           Date of Birth: 1956-03-22           MRN: [id number] Visit Date: 07/02/2017              Requested by: [name], [address] [place], [place] [address] PCP: [name], MD  Chief Complaint  Patient presents with  . Left Leg - Follow-up    Left AKA 03/20/17      HPI: Patient is a 62 year old woman who presents follow-up approximate with the 2-1/2 months status post left above-knee amputation.  Patient has a stump shrinker from [facility] she is completing a 2-week course of doxycycline.  Patient's daughter states that  the patient has been on antibiotics for years and she is concerned that patient will require IV antibiotics to clear this infection.  Assessment & Plan: Visit Diagnoses:  1. Above knee amputation status, left ([place])     Plan: We will call in 2 additional weeks for doxycycline.  Recommended kimchi for probiotics.  Since they have seen Dr. Graylon Good in the past I recommended they see Dr. Graylon Good for a second opinion regarding the need for IV antibiotics.  At this time there is no clinical signs of infection and I feel that this should heal uneventfully.  Plan to follow-up with [facility] for prosthetic fitting.  Patient's daughter states that they want to go on a cruise in September wants to have her leg in place by that time.  Follow-Up Instructions: Return in about 2 weeks (around 07/16/2017).   Ortho Exam  Patient is alert, oriented, no adenopathy, well-dressed, normal affect, normal respiratory effort. Examination patient has no redness no cellulitis she has had some small amount of drainage but I cannot express any fluid from the wound.  The wound is approximately a millimeter in diameter.  There is no tenderness to palpation around the residual limb on the left there is no clinical signs of infection.  She is currently wearing her stump shrinker.  Imaging: No results found. No images are attached to the  encounter.  Labs: Lab Results  Component Value Date   HGBA1C 6.2 (H) 02/05/2017   HGBA1C 6.4 (H) 11/08/2015   HGBA1C 7.3 (H) 10/29/2015   ESRSEDRATE 102 (H) 10/29/2015   ESRSEDRATE 120 (H) 08/09/2014   CRP 1.9 (H) 03/12/2017   CRP 1.4 (H) 10/29/2015   CRP 1.2 (H) 06/29/2015   REPTSTATUS 02/10/2017 FINAL 02/08/2017   GRAMSTAIN  02/06/2017    FEW WBC PRESENT, PREDOMINANTLY MONONUCLEAR NO ORGANISMS SEEN    CULT >=100,000 COLONIES/mL YEAST (A) 02/08/2017   LABORGA NO GROWTH 02/03/2017    @LABSALLVALUES (HGBA1)@  Body mass index is 20.36 kg/m.  Orders:  No orders of the defined types were placed in this encounter.  Meds ordered this encounter  Medications  . doxycycline (VIBRA-TABS) 100 MG tablet    Sig: Take 1 tablet (100 mg total) by mouth 2 (two) times daily.    Dispense:  30 tablet    Refill:  0     Procedures: No procedures performed  Clinical Data: No additional findings.  ROS:  All other systems negative, except as noted in the HPI. Review of Systems  Objective: Vital Signs: Ht 5\' 7"  (1.702 m)   Wt 130 lb (59 kg)   LMP 03/16/2002   BMI 20.36 kg/m   Specialty Comments:  No specialty comments available.  PMFS History: Patient Active  Problem List   Diagnosis Date Noted  . Pain   . Benign essential HTN   . Diabetes mellitus type 2 in obese ([place])   . Stage 5 chronic kidney disease not on chronic dialysis ([place])   . Above knee amputation status, left ([place])   . Acute blood loss anemia   . Chronic diastolic heart failure ([place])   . Post-operative pain   . Leukocytosis   . DM type 2 causing CKD stage 5 ([place]) 03/21/2017  . CKD (chronic kidney disease), stage V ([place]) 03/17/2017  . Wound dehiscence 03/10/2017  . Chronic pain of left knee 03/05/2017  . Fever 02/08/2017  . Infection of prosthetic left knee joint ([place]) 02/07/2017  . AKI (acute kidney injury) ([place])   . Hemarthrosis   . Anemia of chronic disease 02/05/2017  . Diabetes mellitus, type II  ([place]) 02/05/2017  . Dysplasia of cervix, high grade CIN 2 01/12/2017  . ASCUS with positive high risk HPV cervical 12/09/2016  . Chronic diastolic CHF (congestive heart failure) ([place]) 11/08/2015  . Total knee replacement status   . Diabetic peripheral neuropathy ([place])   . Anxiety state   . Depression   . Normocytic anemia 11/07/2015  . Chronic kidney disease (CKD), stage IV (severe) ([place])   . Hepatitis C 08/16/2014  . Peripheral neuropathy 06/24/2013  . 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 Essential hypertension 02/18/2013   Past Medical History:  Diagnosis Date  . Anemia   . Anxiety   . Arthritis    "in my joints" (03/10/2017)  . Chronic diastolic CHF (congestive heart failure) ([place])   . CKD (chronic kidney disease), stage IV ([place])    stage IV. previous HD, none currently 10/30/15 (confirmed 02/05/2017 & 03/10/2017)  . Depression    Chronic  . Diabetic peripheral neuropathy ([place])   . Dysrhythmia    tachycardia, normal ECHO 08-09-14  . GERD (gastroesophageal reflux disease)   . Heart murmur     dx'd 02/05/2017  . Hepatitis C    "tx'd in 2016; I'm negative now" (02/05/2017)  . History of blood transfusion 2017   "w/knee replacement"  . Hypertension   . Osteoarthritis of left knee   . Peripheral neuropathy   . Protein calorie malnutrition ([place])   . Septic arthritis ([place])   . Slow transit constipation   . Type II diabetes mellitus ([name])    IDDM  . Uncontrolled hypertension 02/18/2013  . Unsteady gait     Family History  Problem Relation Age of Onset  . Cancer Mother   . Heart disease Father   . Cancer Sister   . Hypertension Sister   . Cancer Brother   . Diabetes Brother   . Hypertension Brother     Past Surgical History:  Procedure Laterality Date  . AMPUTATION Left 03/20/2017   Procedure: LEFT ABOVE KNEE AMPUTATION;  Surgeon: Newt Minion, MD;  Location: [facility];  Service: Orthopedics;  Laterality: Left;  . APPENDECTOMY    . AV FISTULA PLACEMENT Left  09/13/2014   Procedure: Brachial Artery to Brachial Vein Gortex Four - Seven Stretch GRAFT INSERTION Left Forearm;  Surgeon: Mal Misty, MD;  Location: [facility];  Service: Vascular;  Laterality: Left;  . CHOLECYSTECTOMY OPEN    . COLON SURGERY    . EXCISIONAL TOTAL KNEE ARTHROPLASTY WITH ANTIBIOTIC SPACERS Left 02/06/2017   Procedure: Incisional total iknee with antibiotic spacer ;  Surgeon: Leandrew Koyanagi, MD;  Location: [facility];  Service: Orthopedics;  Laterality:  Left;  . IR FLUORO GUIDE CV LINE RIGHT  02/10/2017  . IR REMOVAL TUN CV CATH W/O FL  04/01/2017  . IR US GUIDE VASC ACCESS RIGHT  02/10/2017  . IRRIGATION AND DEBRIDEMENT KNEE Left 03/12/2017   Procedure: IRRIGATION AND DEBRIDEMENT LEFT KNEE WITH WOUND VAC APPLICATION;  Surgeon: Leandrew Koyanagi, MD;  Location: [facility];  Service: Orthopedics;  Laterality: Left;  . JOINT REPLACEMENT    . KNEE ARTHROSCOPY Right 08/10/2014   Procedure: ARTHROSCOPY I & D KNEE;  Surgeon: Marianna Payment, MD;  [facility];  Service: Orthopedics;  Laterality: Right;  . KNEE ARTHROSCOPY Left 08/11/2014   Procedure: ARTHROSCOPIC WASHOUT LEFT KNEE;  Surgeon: Marianna Payment, MD;  Location: [facility];  Service: Orthopedics;  Laterality: Left;  . KNEE ARTHROSCOPY Left 08/19/2014   Procedure: ARTHROSCOPIC WASHOUT LEFT KNEE;  Surgeon: Leandrew Koyanagi, MD;  Location: [facility];  Service: Orthopedics;  Laterality: Left;  . KNEE ARTHROSCOPY WITH LATERAL MENISECTOMY Left 04/04/2015   Procedure: AND PARTIAL LATERAL MENISECTOMY;  Surgeon: Leandrew Koyanagi, MD;  Location: [facility];  Service: Orthopedics;  Laterality: Left;  . KNEE ARTHROSCOPY WITH MEDIAL MENISECTOMY Left 04/04/2015   Procedure: LEFT KNEE ARTHROSCOPY WITH PARTIAL MEDIAL MENISCECTOMY  AND SYNOVECTOMY;  Surgeon: Leandrew Koyanagi, MD;  Location: [facility];  Service: Orthopedics;  Laterality: Left;  . SHOULDER ARTHROSCOPY Bilateral 08/10/2014   Procedure: I & D BILATERAL SHOULDERS ;  Surgeon:  Marianna Payment, MD;  [facility];  Service: Orthopedics;  Laterality: Bilateral;  . SMALL INTESTINE SURGERY     Due to Small Bowel Obstruction; "fixed it when they did my gallbladder OR"  . TEE WITHOUT CARDIOVERSION N/A 08/14/2014   Procedure: TRANSESOPHAGEAL ECHOCARDIOGRAM (TEE);  Surgeon: Thayer Headings, MD;  Location: [facility];  Service: Cardiovascular;  Laterality: N/A;  . TENOSYNOVECTOMY Right 08/11/2014   Procedure: RIGHT WRIST IRRIGATION AND DEBRIDEMENT, TENOSYNOVECTOMY;  Surgeon: Marianna Payment, MD;  Location: [facility];  Service: Orthopedics;  Laterality: Right;  . TOTAL KNEE ARTHROPLASTY Left 11/07/2015   Procedure: LEFT TOTAL KNEE ARTHROPLASTY WITH REVISION OF IMPLANTS;  Surgeon: Leandrew Koyanagi, MD;  Location: [place];  Service: Orthopedics;  Laterality: Left;  . TUBAL LIGATION     Social History   Occupational History  . Not on file  Tobacco Use  . Smoking status: Current Every Day Smoker    Packs/day: 0.33    Years: 35.00    Pack years: 11.55    Types: Cigarettes  . Smokeless tobacco: Never Used  Substance and Sexual Activity  . Alcohol use: No    Alcohol/week: 0.0 oz    Comment: 03/10/2017 "I drink a wine cooler a few times/year"  . Drug use: No  . Sexual activity: No

## 2017-07-06 ENCOUNTER — Telehealth: Payer: Self-pay | Admitting: Hematology and Oncology

## 2017-07-06 DIAGNOSIS — I5032 Chronic diastolic (congestive) heart failure: Secondary | ICD-10-CM | POA: Diagnosis not present

## 2017-07-06 DIAGNOSIS — N185 Chronic kidney disease, stage 5: Secondary | ICD-10-CM | POA: Diagnosis not present

## 2017-07-06 DIAGNOSIS — T8781 Dehiscence of amputation stump: Secondary | ICD-10-CM | POA: Diagnosis not present

## 2017-07-06 DIAGNOSIS — E1122 Type 2 diabetes mellitus with diabetic chronic kidney disease: Secondary | ICD-10-CM | POA: Diagnosis not present

## 2017-07-06 DIAGNOSIS — E1142 Type 2 diabetes mellitus with diabetic polyneuropathy: Secondary | ICD-10-CM | POA: Diagnosis not present

## 2017-07-06 DIAGNOSIS — I132 Hypertensive heart and chronic kidney disease with heart failure and with stage 5 chronic kidney disease, or end stage renal disease: Secondary | ICD-10-CM | POA: Diagnosis not present

## 2017-07-06 NOTE — Telephone Encounter (Signed)
Attempted to schedule appt per 1/18 sch msg - patient said she would call back once she talks to her primary care.

## 2017-07-07 ENCOUNTER — Telehealth (INDEPENDENT_AMBULATORY_CARE_PROVIDER_SITE_OTHER): Payer: Self-pay | Admitting: Orthopedic Surgery

## 2017-07-07 ENCOUNTER — Other Ambulatory Visit (INDEPENDENT_AMBULATORY_CARE_PROVIDER_SITE_OTHER): Payer: Self-pay

## 2017-07-07 DIAGNOSIS — Z89612 Acquired absence of left leg above knee: Secondary | ICD-10-CM

## 2017-07-07 NOTE — Telephone Encounter (Signed)
Patient called needing a referral from Dr. Sharol Given to go to the infectious Disease clinic. The number to contact patient is [phone]

## 2017-07-07 NOTE — Progress Notes (Signed)
referral

## 2017-07-07 NOTE — Telephone Encounter (Signed)
Referral in for infectious disease referral per pt request ( second opinion) see Dr. Jess Barters last note. She has seen Dr. Graylon Good in the past.

## 2017-07-08 ENCOUNTER — Encounter: Payer: Self-pay | Admitting: Family Medicine

## 2017-07-08 DIAGNOSIS — I132 Hypertensive heart and chronic kidney disease with heart failure and with stage 5 chronic kidney disease, or end stage renal disease: Secondary | ICD-10-CM | POA: Diagnosis not present

## 2017-07-08 DIAGNOSIS — T8781 Dehiscence of amputation stump: Secondary | ICD-10-CM | POA: Diagnosis not present

## 2017-07-08 DIAGNOSIS — I5032 Chronic diastolic (congestive) heart failure: Secondary | ICD-10-CM | POA: Diagnosis not present

## 2017-07-08 DIAGNOSIS — N185 Chronic kidney disease, stage 5: Secondary | ICD-10-CM | POA: Diagnosis not present

## 2017-07-08 DIAGNOSIS — E1142 Type 2 diabetes mellitus with diabetic polyneuropathy: Secondary | ICD-10-CM | POA: Diagnosis not present

## 2017-07-08 DIAGNOSIS — E1122 Type 2 diabetes mellitus with diabetic chronic kidney disease: Secondary | ICD-10-CM | POA: Diagnosis not present

## 2017-07-08 LAB — CHG IRON BINDING TEST: Iron Saturation: 27

## 2017-07-08 LAB — IRON: Iron: 71

## 2017-07-08 LAB — PHOSPHORUS: Phosphorus: 5.6

## 2017-07-08 LAB — IRON AND TIBC: Iron Bind.Cap.(Total): 264

## 2017-07-08 LAB — UIBC: UIBC: 193

## 2017-07-08 LAB — PTH, INTACT: PTH, Intact: 183

## 2017-07-08 NOTE — Telephone Encounter (Signed)
Patient scheduled for 07-21-17  @ 9:30 with Dr Megan Salon

## 2017-07-13 DIAGNOSIS — E1142 Type 2 diabetes mellitus with diabetic polyneuropathy: Secondary | ICD-10-CM | POA: Diagnosis not present

## 2017-07-13 DIAGNOSIS — I132 Hypertensive heart and chronic kidney disease with heart failure and with stage 5 chronic kidney disease, or end stage renal disease: Secondary | ICD-10-CM | POA: Diagnosis not present

## 2017-07-13 DIAGNOSIS — E1122 Type 2 diabetes mellitus with diabetic chronic kidney disease: Secondary | ICD-10-CM | POA: Diagnosis not present

## 2017-07-13 DIAGNOSIS — T8781 Dehiscence of amputation stump: Secondary | ICD-10-CM | POA: Diagnosis not present

## 2017-07-13 DIAGNOSIS — N185 Chronic kidney disease, stage 5: Secondary | ICD-10-CM | POA: Diagnosis not present

## 2017-07-13 DIAGNOSIS — I5032 Chronic diastolic (congestive) heart failure: Secondary | ICD-10-CM | POA: Diagnosis not present

## 2017-07-14 ENCOUNTER — Encounter (HOSPITAL_COMMUNITY)
Admission: RE | Admit: 2017-07-14 | Discharge: 2017-07-14 | Disposition: A | Discharge status: Home / Self Care | Payer: Medicare Other | Source: Ambulatory Visit | Attending: Nephrology | Admitting: Nephrology

## 2017-07-14 VITALS — BP 171/75 | HR 86 | Temp 98.1°F | Resp 16

## 2017-07-14 DIAGNOSIS — N184 Chronic kidney disease, stage 4 (severe): Secondary | ICD-10-CM | POA: Diagnosis not present

## 2017-07-14 DIAGNOSIS — D631 Anemia in chronic kidney disease: Secondary | ICD-10-CM | POA: Insufficient documentation

## 2017-07-14 LAB — IRON AND TIBC
Iron: 111 ug/dL (ref 28–170)
Saturation Ratios: 41 % — ABNORMAL HIGH (ref 10.4–31.8)
TIBC: 269 ug/dL (ref 250–450)
UIBC: 158 ug/dL

## 2017-07-14 LAB — FERRITIN: Ferritin: 522 ng/mL — ABNORMAL HIGH (ref 11–307)

## 2017-07-14 MED ORDER — DARBEPOETIN ALFA 200 MCG/0.4ML IJ SOSY
PREFILLED_SYRINGE | INTRAMUSCULAR | Status: AC
  Filled 2017-07-14: qty 0.4

## 2017-07-14 MED ORDER — DARBEPOETIN ALFA 200 MCG/0.4ML IJ SOSY
200.0000 ug | PREFILLED_SYRINGE | INTRAMUSCULAR | Status: DC
  Administered 2017-07-14: 200 ug via SUBCUTANEOUS

## 2017-07-15 LAB — POCT HEMOGLOBIN-HEMACUE: Hemoglobin: 10.8 g/dL — ABNORMAL LOW (ref 12.0–15.0)

## 2017-07-16 ENCOUNTER — Encounter (INDEPENDENT_AMBULATORY_CARE_PROVIDER_SITE_OTHER): Payer: Self-pay | Admitting: Family

## 2017-07-16 ENCOUNTER — Ambulatory Visit (INDEPENDENT_AMBULATORY_CARE_PROVIDER_SITE_OTHER): Payer: Medicare Other | Admitting: Family

## 2017-07-16 VITALS — Ht 67.0 in | Wt 130.0 lb

## 2017-07-16 DIAGNOSIS — Z89612 Acquired absence of left leg above knee: Secondary | ICD-10-CM

## 2017-07-16 DIAGNOSIS — IMO0002 Reserved for concepts with insufficient information to code with codable children: Secondary | ICD-10-CM

## 2017-07-16 MED ORDER — GABAPENTIN 300 MG PO CAPS: 300.0000 mg | ORAL_CAPSULE | Freq: Three times a day (TID) | ORAL | 0 refills | Status: DC

## 2017-07-16 NOTE — Progress Notes (Signed)
Office Visit Note   Patient: Brandy Houston           Date of Birth: [birth year]           MRN: [id number] Visit Date: 07/16/2017              Requested by: [name], [address] [place], [place] [address] PCP: [name], MD  Chief Complaint  Patient presents with  . Left Leg - Follow-up    03/20/17 left below the knee amputation       HPI: Patient is a 62 year old woman who presents status post left above-knee amputation on 03/20/17.  Patient has a stump shrinker from [employer].  Patient's pleased with resolution of wound and drainage. Does continue to have scant drainage from center of incision. Doing dry dressings.    Has appointment with Dr. Megan Salon on 07/21/17.  Complaining of burning and shooting pain in LLE consistent with nerve pain she has had in past. Is taking Gabapentin 100 mg tid. Feels this is not providing adequate relief at this time.    Assessment & Plan: Visit Diagnoses:  1. Above knee amputation status, left ([place])     Plan:  At this time there are no clinical signs of infection and I feel that this should heal uneventfully. Will follow with ID in 1 week. Pending their opinion I feel she will soon be able to follow-up with [facility] for prosthetic fitting.    Follow-Up Instructions: Return in about 3 weeks (around 08/06/2017).   Ortho Exam  Patient is alert, oriented, no adenopathy, well-dressed, normal affect, normal respiratory effort. Examination patient has no redness no cellulitis. she has had some small amount of clear drainage but I cannot express any fluid from the wound.  The wound is approximately a millimeter in diameter.  There is no tenderness to palpation around the residual limb on the left there is no clinical signs of infection.  She is currently wearing her stump shrinker.  Imaging: No results found. No images are attached to the encounter.  Labs: Lab Results  Component Value Date   HGBA1C 5.3 06/26/2017   HGBA1C 6.2 (H)  02/05/2017   HGBA1C 6.4 (H) 11/08/2015   ESRSEDRATE 102 (H) 10/29/2015   ESRSEDRATE 120 (H) 08/09/2014   CRP 1.9 (H) 03/12/2017   CRP 1.4 (H) 10/29/2015   CRP 1.2 (H) 06/29/2015   REPTSTATUS 02/10/2017 FINAL 02/08/2017   GRAMSTAIN  02/06/2017    FEW WBC PRESENT, PREDOMINANTLY MONONUCLEAR NO ORGANISMS SEEN    CULT >=100,000 COLONIES/mL YEAST (A) 02/08/2017   LABORGA NO GROWTH 02/03/2017    @LABSALLVALUES (HGBA1)@  Body mass index is 20.36 kg/m.  Orders:  No orders of the defined types were placed in this encounter.  No orders of the defined types were placed in this encounter.    Procedures: No procedures performed  Clinical Data: No additional findings.  ROS:  All other systems negative, except as noted in the HPI. Review of Systems  Constitutional: Negative for chills and fever.  Musculoskeletal: Negative for myalgias.  Skin: Positive for wound. Negative for color change.    Objective: Vital Signs: Ht 5\' 7"  (1.702 m)   Wt 130 lb (59 kg)   LMP 03/16/2002   BMI 20.36 kg/m   Specialty Comments:  No specialty comments available.  PMFS History: Patient Active Problem List   Diagnosis Date Noted  . Pain   . Benign essential HTN   . Diabetes mellitus type 2 in  obese ([place])   . Stage 5 chronic kidney disease not on chronic dialysis ([place])   . Above knee amputation status, left ([place])   . Acute blood loss anemia   . Chronic diastolic heart failure ([place])   . Post-operative pain   . Leukocytosis   . DM type 2 causing CKD stage 5 ([place]) 03/21/2017  . CKD (chronic kidney disease), stage V ([place]) 03/17/2017  . Wound dehiscence 03/10/2017  . Chronic pain of left knee 03/05/2017  . Fever 02/08/2017  . Infection of prosthetic left knee joint ([place]) 02/07/2017  . AKI (acute kidney injury) ([place])   . Hemarthrosis   . Anemia of chronic disease 02/05/2017  . Diabetes mellitus, type II ([place]) 02/05/2017  . Dysplasia of cervix, high grade CIN 2 01/12/2017  . ASCUS with  positive high risk HPV cervical 12/09/2016  . Chronic diastolic CHF (congestive heart failure) ([place]) 11/08/2015  . Total knee replacement status   . Diabetic peripheral neuropathy ([place])   . Anxiety state   . Depression   . Normocytic anemia 11/07/2015  . Chronic kidney disease (CKD), stage IV (severe) ([place])   . Hepatitis C 08/16/2014  . Peripheral neuropathy 06/24/2013  . 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 Essential hypertension 02/18/2013   Past Medical History:  Diagnosis Date  . Anemia   . Anxiety   . Arthritis    "in my joints" (03/10/2017)  . Chronic diastolic CHF (congestive heart failure) ([place])   . CKD (chronic kidney disease), stage IV ([place])    stage IV. previous HD, none currently 10/30/15 (confirmed 02/05/2017 & 03/10/2017)  . Depression    Chronic  . Diabetic peripheral neuropathy ([place])   . Dysrhythmia    tachycardia, normal ECHO 08-09-14  . GERD (gastroesophageal reflux disease)   . Heart murmur     dx'd 02/05/2017  . Hepatitis C    "tx'd in 2016; I'm negative now" (02/05/2017)  . History of blood transfusion 2017   "w/knee replacement"  . Hypertension   . Osteoarthritis of left knee   . Peripheral neuropathy   . Protein calorie malnutrition ([place])   . Septic arthritis ([place])   . Slow transit constipation   . Type II diabetes mellitus ([name])    IDDM  . Uncontrolled hypertension 02/18/2013  . Unsteady gait     Family History  Problem Relation Age of Onset  . Cancer Mother   . Heart disease Father   . Cancer Sister   . Hypertension Sister   . Cancer Brother   . Diabetes Brother   . Hypertension Brother     Past Surgical History:  Procedure Laterality Date  . AMPUTATION Left 03/20/2017   Procedure: LEFT ABOVE KNEE AMPUTATION;  Surgeon: Newt Minion, MD;  Location: [facility];  Service: Orthopedics;  Laterality: Left;  . APPENDECTOMY    . AV FISTULA PLACEMENT Left 09/13/2014   Procedure: Brachial Artery to Brachial Vein Gortex Four - Seven Stretch GRAFT  INSERTION Left Forearm;  Surgeon: Mal Misty, MD;  Location: [facility];  Service: Vascular;  Laterality: Left;  . CHOLECYSTECTOMY OPEN    . COLON SURGERY    . EXCISIONAL TOTAL KNEE ARTHROPLASTY WITH ANTIBIOTIC SPACERS Left 02/06/2017   Procedure: Incisional total iknee with antibiotic spacer ;  Surgeon: Leandrew Koyanagi, MD;  Location: [facility];  Service: Orthopedics;  Laterality: Left;  . IR FLUORO GUIDE CV LINE RIGHT  02/10/2017  . IR REMOVAL TUN CV CATH W/O FL  04/01/2017  .  IR US GUIDE VASC ACCESS RIGHT  02/10/2017  . IRRIGATION AND DEBRIDEMENT KNEE Left 03/12/2017   Procedure: IRRIGATION AND DEBRIDEMENT LEFT KNEE WITH WOUND VAC APPLICATION;  Surgeon: Leandrew Koyanagi, MD;  Location: [facility];  Service: Orthopedics;  Laterality: Left;  . JOINT REPLACEMENT    . KNEE ARTHROSCOPY Right 08/10/2014   Procedure: ARTHROSCOPY I & D KNEE;  Surgeon: Marianna Payment, MD;  Location: [facility];  Service: Orthopedics;  Laterality: Right;  . KNEE ARTHROSCOPY Left 08/11/2014   Procedure: ARTHROSCOPIC WASHOUT LEFT KNEE;  Surgeon: Marianna Payment, MD;  Location: [facility];  Service: Orthopedics;  Laterality: Left;  . KNEE ARTHROSCOPY Left 08/19/2014   Procedure: ARTHROSCOPIC WASHOUT LEFT KNEE;  Surgeon: Leandrew Koyanagi, MD;  Location: [facility];  Service: Orthopedics;  Laterality: Left;  . KNEE ARTHROSCOPY WITH LATERAL MENISECTOMY Left 04/04/2015   Procedure: AND PARTIAL LATERAL MENISECTOMY;  Surgeon: Leandrew Koyanagi, MD;  Location: [facility];  Service: Orthopedics;  Laterality: Left;  . KNEE ARTHROSCOPY WITH MEDIAL MENISECTOMY Left 04/04/2015   Procedure: LEFT KNEE ARTHROSCOPY WITH PARTIAL MEDIAL MENISCECTOMY  AND SYNOVECTOMY;  Surgeon: Leandrew Koyanagi, MD;  Location: [facility];  Service: Orthopedics;  Laterality: Left;  . SHOULDER ARTHROSCOPY Bilateral 08/10/2014   Procedure: I & D BILATERAL SHOULDERS ;  Surgeon: Marianna Payment, MD;  Location: [facility];  Service: Orthopedics;  Laterality: Bilateral;  .  SMALL INTESTINE SURGERY     Due to Small Bowel Obstruction; "fixed it when they did my gallbladder OR"  . TEE WITHOUT CARDIOVERSION N/A 08/14/2014   Procedure: TRANSESOPHAGEAL ECHOCARDIOGRAM (TEE);  Surgeon: Thayer Headings, MD;  Location: [facility];  Service: Cardiovascular;  Laterality: N/A;  . TENOSYNOVECTOMY Right 08/11/2014   Procedure: RIGHT WRIST IRRIGATION AND DEBRIDEMENT, TENOSYNOVECTOMY;  Surgeon: Marianna Payment, MD;  Location: [facility];  Service: Orthopedics;  Laterality: Right;  . TOTAL KNEE ARTHROPLASTY Left 11/07/2015   Procedure: LEFT TOTAL KNEE ARTHROPLASTY WITH REVISION OF IMPLANTS;  Surgeon: Leandrew Koyanagi, MD;  Location: [facility];  Service: Orthopedics;  Laterality: Left;  . TUBAL LIGATION     Social History   Occupational History  . Not on file  Tobacco Use  . Smoking status: Current Every Day Smoker    Packs/day: 0.33    Years: 35.00    Pack years: 11.55    Types: Cigarettes  . Smokeless tobacco: Never Used  Substance and Sexual Activity  . Alcohol use: No    Alcohol/week: 0.0 oz    Comment: 03/10/2017 "I drink a wine cooler a few times/year"  . Drug use: No  . Sexual activity: No

## 2017-07-17 DIAGNOSIS — E1122 Type 2 diabetes mellitus with diabetic chronic kidney disease: Secondary | ICD-10-CM | POA: Diagnosis not present

## 2017-07-17 DIAGNOSIS — I5032 Chronic diastolic (congestive) heart failure: Secondary | ICD-10-CM | POA: Diagnosis not present

## 2017-07-17 DIAGNOSIS — E1142 Type 2 diabetes mellitus with diabetic polyneuropathy: Secondary | ICD-10-CM | POA: Diagnosis not present

## 2017-07-17 DIAGNOSIS — T8781 Dehiscence of amputation stump: Secondary | ICD-10-CM | POA: Diagnosis not present

## 2017-07-17 DIAGNOSIS — N185 Chronic kidney disease, stage 5: Secondary | ICD-10-CM | POA: Diagnosis not present

## 2017-07-17 DIAGNOSIS — I132 Hypertensive heart and chronic kidney disease with heart failure and with stage 5 chronic kidney disease, or end stage renal disease: Secondary | ICD-10-CM | POA: Diagnosis not present

## 2017-07-20 DIAGNOSIS — N185 Chronic kidney disease, stage 5: Secondary | ICD-10-CM | POA: Diagnosis not present

## 2017-07-20 DIAGNOSIS — T8781 Dehiscence of amputation stump: Secondary | ICD-10-CM | POA: Diagnosis not present

## 2017-07-20 DIAGNOSIS — E1122 Type 2 diabetes mellitus with diabetic chronic kidney disease: Secondary | ICD-10-CM | POA: Diagnosis not present

## 2017-07-20 DIAGNOSIS — E1142 Type 2 diabetes mellitus with diabetic polyneuropathy: Secondary | ICD-10-CM | POA: Diagnosis not present

## 2017-07-20 DIAGNOSIS — I132 Hypertensive heart and chronic kidney disease with heart failure and with stage 5 chronic kidney disease, or end stage renal disease: Secondary | ICD-10-CM | POA: Diagnosis not present

## 2017-07-20 DIAGNOSIS — I5032 Chronic diastolic (congestive) heart failure: Secondary | ICD-10-CM | POA: Diagnosis not present

## 2017-07-21 ENCOUNTER — Encounter: Payer: Self-pay | Admitting: Internal Medicine

## 2017-07-21 ENCOUNTER — Ambulatory Visit (INDEPENDENT_AMBULATORY_CARE_PROVIDER_SITE_OTHER): Payer: Medicare Other | Admitting: Internal Medicine

## 2017-07-21 DIAGNOSIS — Z89612 Acquired absence of left leg above knee: Secondary | ICD-10-CM

## 2017-07-21 DIAGNOSIS — IMO0002 Reserved for concepts with insufficient information to code with codable children: Secondary | ICD-10-CM

## 2017-07-21 MED ORDER — DOXYCYCLINE HYCLATE 100 MG PO CAPS: 100.0000 mg | ORAL_CAPSULE | Freq: Two times a day (BID) | ORAL | 1 refills | Status: DC

## 2017-07-21 NOTE — Assessment & Plan Note (Signed)
She has had some persistent wound drainage followed left AKA but seems to have gotten a good response to recent doxycycline therapy.  I will check her inflammatory markers today and extended doxycycline for 2 more weeks.  She is in agreement with that plan.

## 2017-07-21 NOTE — Progress Notes (Signed)
[facility]  Patient Active Problem List   Diagnosis Date Noted  . Above knee amputation status, left ([place])     Priority: High  . Infection of prosthetic left knee joint ([place]) 02/07/2017    Priority: High  . Pain   . Benign essential HTN   . Diabetes mellitus type 2 in obese ([place])   . Stage 5 chronic kidney disease not on chronic dialysis ([place])   . Acute blood loss anemia   . Chronic diastolic heart failure ([place])   . Post-operative pain   . Leukocytosis   . DM type 2 causing CKD stage 5 ([place]) 03/21/2017  . CKD (chronic kidney disease), stage V ([place]) 03/17/2017  . Wound dehiscence 03/10/2017  . Chronic pain of left knee 03/05/2017  . AKI (acute kidney injury) ([place])   . Hemarthrosis   . Anemia of chronic disease 02/05/2017  . Diabetes mellitus, type II ([place]) 02/05/2017  . Dysplasia of cervix, high grade CIN 2 01/12/2017  . ASCUS with positive high risk HPV cervical 12/09/2016  . Chronic diastolic CHF (congestive heart failure) ([place]) 11/08/2015  . Total knee replacement status   . Diabetic peripheral neuropathy ([place])   . Anxiety state   . Depression   . Normocytic anemia 11/07/2015  . Chronic kidney disease (CKD), stage IV (severe) ([place])   . Hepatitis C 08/16/2014  . Peripheral neuropathy 06/24/2013  . 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 Essential hypertension 02/18/2013    Patient's Medications  New Prescriptions   No medications on file  Previous Medications   AMLODIPINE (NORVASC) 10 MG TABLET    Take 1 tablet (10 mg total) by mouth at bedtime.   BLOOD GLUCOSE MONITORING SUPPL (ACCU-CHEK AVIVA PLUS) W/DEVICE KIT    Check sugars TID for E11.65   CALCITRIOL (ROCALTROL) 0.25 MCG CAPSULE    Take 1 capsule (0.25 mcg total) by mouth every Monday, Wednesday, and Friday.   CALCIUM CARBONATE (TUMS EX) 750 MG CHEWABLE TABLET    Chew 1 tablet by mouth 3 (three) times daily as needed for heartburn.    DOXYCYCLINE (VIBRA-TABS) 100  MG TABLET    Take 1 tablet (100 mg total) by mouth 2 (two) times daily.   FERROUS SULFATE 325 (65 FE) MG TABLET    Take 1 tablet (325 mg total) by mouth 2 (two) times daily with a meal.   FUROSEMIDE (LASIX) 80 MG TABLET    Take 1 tablet (80 mg total) by mouth 2 (two) times daily.   GABAPENTIN (NEURONTIN) 300 MG CAPSULE    Take 1 capsule (300 mg total) by mouth 3 (three) times daily.   GLUCOSE BLOOD (ACCU-CHEK AVIVA) TEST STRIP    Check sugars TID for E11.65   HYDRALAZINE (APRESOLINE) 25 MG TABLET    Take 3 tablets (75 mg total) by mouth every 6 (six) hours.   HYDROCODONE-ACETAMINOPHEN (NORCO/VICODIN) 5-325 MG TABLET    Take 1 tablet by mouth every 6 (six) hours as needed for moderate pain.   INSULIN GLARGINE (LANTUS) 100 UNIT/ML SOLOSTAR PEN    Inject 15 Units into the skin daily at 10 pm.   LANCET DEVICES (ACCU-CHEK SOFTCLIX) LANCETS    Check sugars TID for E11.65   LIDOCAINE (LIDODERM) 5 %    Place 1 patch onto the skin daily. Remove & Discard patch within 12 hours or as directed by MD   METHOCARBAMOL (ROBAXIN) 500 MG TABLET    TAKE 1 TABLET  BY MOUTH EVERY 6 HOURS AS NEEDED FOR MUSCLE SPASM   METOPROLOL SUCCINATE (TOPROL-XL) 50 MG 24 HR TABLET    Take 1 tablet (50 mg total) by mouth 2 (two) times daily.   MULTIPLE VITAMIN (MULTIVITAMIN WITH MINERALS) TABS TABLET    Take 1 tablet by mouth daily.   OMEPRAZOLE (PRILOSEC) 20 MG CAPSULE    Take 20 mg by mouth daily before breakfast.    POLYETHYLENE GLYCOL (MIRALAX / GLYCOLAX) PACKET    Take 17 g by mouth 2 (two) times daily.   SERTRALINE (ZOLOFT) 100 MG TABLET    Take 1 tablet (100 mg total) by mouth daily.  Modified Medications   Modified Medication Previous Medication   DOXYCYCLINE (VIBRAMYCIN) 100 MG CAPSULE doxycycline (VIBRAMYCIN) 100 MG capsule      Take 1 capsule (100 mg total) by mouth 2 (two) times daily.    Take 1 capsule (100 mg total) by mouth 2 (two) times daily.  Discontinued Medications   No medications on file    Subjective: Ms.  Pundt is in for a routine follow-up visit.  She is accompanied by her son, Raquel Sarna.  She has a history of MSSA left prosthetic knee infection and was on chronic suppressive antibiotics for several years under the guidance of my partner, Dr. Baxter Flattery.  She underwent resection arthroplasty on 02/06/2017.  Operative cultures grew 1 colony of Bacteroides that I believed was.  She received 6 weeks of IV of ciprofloxacin and daptomycin.  Toward the end of her treatment she had dehiscence of her wound and underwent left above-knee amputation on 03/20/2017.  She has had one small pinpoint area of persistent wound drainage.  Raquel Sarna says that 1 month ago a swab could be inserted about 1-1/4 inches.  She was having small amounts of brown drainage.  She has just recently completed 4 weeks of doxycycline and has noted significant improvement.  She is now only having pinpoint drainage.  Review of Systems: Review of Systems  Constitutional: Negative for chills, diaphoresis and fever.  Gastrointestinal: Negative for abdominal pain, diarrhea, nausea and vomiting.  Musculoskeletal: Positive for joint pain.    Past Medical History:  Diagnosis Date  . Anemia   . Anxiety   . Arthritis    "in my joints" (03/10/2017)  . Chronic diastolic CHF (congestive heart failure) ([place])   . CKD (chronic kidney disease), stage IV ([place])    stage IV. previous HD, none currently 10/30/15 (confirmed 02/05/2017 & 03/10/2017)  . Depression    Chronic  . Diabetic peripheral neuropathy ([place])   . Dysrhythmia    tachycardia, normal ECHO 08-09-14  . GERD (gastroesophageal reflux disease)   . Heart murmur     dx'd 02/05/2017  . Hepatitis C    "tx'd in 2016; I'm negative now" (02/05/2017)  . History of blood transfusion 2017   "w/knee replacement"  . Hypertension   . Osteoarthritis of left knee   . Peripheral neuropathy   . Protein calorie malnutrition ([place])   . Septic arthritis ([place])   . Slow transit constipation   . Type II diabetes mellitus  ([name])    IDDM  . Uncontrolled hypertension 02/18/2013  . Unsteady gait     Social History   Tobacco Use  . Smoking status: Current Every Day Smoker    Packs/day: 0.33    Years: 35.00    Pack years: 11.55    Types: Cigarettes  . Smokeless tobacco: Never Used  Substance Use Topics  . Alcohol use: No  Alcohol/week: 0.0 oz    Comment: 03/10/2017 "I drink a wine cooler a few times/year"  . Drug use: No    Family History  Problem Relation Age of Onset  . Cancer Mother   . Heart disease Father   . Cancer Sister   . Hypertension Sister   . Cancer Brother   . Diabetes Brother   . Hypertension Brother     Allergies  Allergen Reactions  . Compazine [Prochlorperazine] Shortness Of Breath and Swelling    TONGUE SWELLS  . Shellfish-Derived Products Anaphylaxis  . Iodinated Diagnostic Agents Hives and Rash  . Omnipaque [Iohexol] Hives    Objective: Vitals:   07/21/17 0940  BP: (!) 177/84  Pulse: 80  Temp: 98.3 F (36.8 C)  TempSrc: Oral  Weight: 130 lb (59 kg)  Height: _0  (1.702 m)   Body mass index is 20.36 kg/m.  Physical Exam  Constitutional: She is oriented to person, place, and time.  She is in good spirits.  She is seated in a wheelchair.  Musculoskeletal:  There is one tiny pinpoint area that I can see in the midportion of her left AKA wound that remains open.  I am unable to probe this area.  There is only a scant amount of yellow drainage at the tip of the cotton swab.  Neurological: She is alert and oriented to person, place, and time.  Skin: No rash noted.  Psychiatric: Mood and affect normal.    Lab Results    Problem List Items Addressed This Visit      High   Above knee amputation status, left ([place])    She has had some persistent wound drainage followed left AKA but seems to have gotten a good response to recent doxycycline therapy.  I will check her inflammatory markers today and extended doxycycline for 2 more weeks.  She is in agreement  with that plan.      Relevant Orders   C-reactive protein   Sedimentation rate       Michel Bickers, MD [facility] [place] [phone] pager   [phone] cell 07/21/2017, 10:04 AM

## 2017-07-22 DIAGNOSIS — I132 Hypertensive heart and chronic kidney disease with heart failure and with stage 5 chronic kidney disease, or end stage renal disease: Secondary | ICD-10-CM | POA: Diagnosis not present

## 2017-07-22 DIAGNOSIS — N185 Chronic kidney disease, stage 5: Secondary | ICD-10-CM | POA: Diagnosis not present

## 2017-07-22 DIAGNOSIS — E1142 Type 2 diabetes mellitus with diabetic polyneuropathy: Secondary | ICD-10-CM | POA: Diagnosis not present

## 2017-07-22 DIAGNOSIS — E1122 Type 2 diabetes mellitus with diabetic chronic kidney disease: Secondary | ICD-10-CM | POA: Diagnosis not present

## 2017-07-22 DIAGNOSIS — I5032 Chronic diastolic (congestive) heart failure: Secondary | ICD-10-CM | POA: Diagnosis not present

## 2017-07-22 DIAGNOSIS — T8781 Dehiscence of amputation stump: Secondary | ICD-10-CM | POA: Diagnosis not present

## 2017-07-22 LAB — C-REACTIVE PROTEIN: CRP: 11.1 mg/L — ABNORMAL HIGH (ref <8.0)

## 2017-07-22 LAB — SEDIMENTATION RATE: Sed Rate: 99 mm/h — ABNORMAL HIGH (ref 0–30)

## 2017-07-27 DIAGNOSIS — E1129 Type 2 diabetes mellitus with other diabetic kidney complication: Secondary | ICD-10-CM | POA: Diagnosis not present

## 2017-07-27 DIAGNOSIS — T8781 Dehiscence of amputation stump: Secondary | ICD-10-CM | POA: Diagnosis not present

## 2017-07-27 DIAGNOSIS — N184 Chronic kidney disease, stage 4 (severe): Secondary | ICD-10-CM | POA: Diagnosis not present

## 2017-07-27 DIAGNOSIS — E1122 Type 2 diabetes mellitus with diabetic chronic kidney disease: Secondary | ICD-10-CM | POA: Diagnosis not present

## 2017-07-27 DIAGNOSIS — I129 Hypertensive chronic kidney disease with stage 1 through stage 4 chronic kidney disease, or unspecified chronic kidney disease: Secondary | ICD-10-CM | POA: Diagnosis not present

## 2017-07-27 DIAGNOSIS — Z720 Tobacco use: Secondary | ICD-10-CM | POA: Diagnosis not present

## 2017-07-27 DIAGNOSIS — E1142 Type 2 diabetes mellitus with diabetic polyneuropathy: Secondary | ICD-10-CM | POA: Diagnosis not present

## 2017-07-27 DIAGNOSIS — N185 Chronic kidney disease, stage 5: Secondary | ICD-10-CM | POA: Diagnosis not present

## 2017-07-27 DIAGNOSIS — I5032 Chronic diastolic (congestive) heart failure: Secondary | ICD-10-CM | POA: Diagnosis not present

## 2017-07-27 DIAGNOSIS — I132 Hypertensive heart and chronic kidney disease with heart failure and with stage 5 chronic kidney disease, or end stage renal disease: Secondary | ICD-10-CM | POA: Diagnosis not present

## 2017-07-27 DIAGNOSIS — N2581 Secondary hyperparathyroidism of renal origin: Secondary | ICD-10-CM | POA: Diagnosis not present

## 2017-07-27 DIAGNOSIS — D631 Anemia in chronic kidney disease: Secondary | ICD-10-CM | POA: Diagnosis not present

## 2017-07-27 DIAGNOSIS — N049 Nephrotic syndrome with unspecified morphologic changes: Secondary | ICD-10-CM | POA: Diagnosis not present

## 2017-07-30 ENCOUNTER — Encounter (INDEPENDENT_AMBULATORY_CARE_PROVIDER_SITE_OTHER): Payer: Self-pay | Admitting: Orthopedic Surgery

## 2017-07-30 ENCOUNTER — Ambulatory Visit (INDEPENDENT_AMBULATORY_CARE_PROVIDER_SITE_OTHER): Payer: Medicare Other | Admitting: Orthopedic Surgery

## 2017-07-30 VITALS — Ht 67.0 in | Wt 130.0 lb

## 2017-07-30 DIAGNOSIS — IMO0002 Reserved for concepts with insufficient information to code with codable children: Secondary | ICD-10-CM

## 2017-07-30 DIAGNOSIS — Z89612 Acquired absence of left leg above knee: Secondary | ICD-10-CM | POA: Diagnosis not present

## 2017-07-30 NOTE — Progress Notes (Signed)
Office Visit Note   Patient: Brandy Houston           Date of Birth: 1956/01/20           MRN: [id number] Visit Date: 07/30/2017              Requested by: [name], [address] [place], [place] [address] PCP: [name], MD  Chief Complaint  Patient presents with  . Left Leg - Follow-up    AKA      HPI: Patient is a 62 year old woman who presents status post left above-knee amputation on 03/20/17.  Patient has a stump shrinker from [employer].  Patient's pleased with resolution of wound and drainage. No longer having andy drainage. Open area has healed. Would like her order for her prosthesis.   Had appointment with Dr. Megan Salon on 07/21/17. Inflammatory markers were drawn, Doxycycline was extended 2 more weeks.      Assessment & Plan: Visit Diagnoses:  No diagnosis found.  Plan:  At this time there are no clinical signs of infection. Given an order for her prosthesis. She will soon be able to follow-up with [facility] for prosthetic fitting.  Follow up in office in 3 months with Dr. Sharol Given.  Follow-Up Instructions: No Follow-up on file.   Ortho Exam  Patient is alert, oriented, no adenopathy, well-dressed, normal affect, normal respiratory effort. Examination patient has no redness no cellulitis. No open area. Mild tenderness to palpation to distal femur. No clinical signs of infection.  She is currently wearing her stump shrinker.  Imaging: No results found. No images are attached to the encounter.  Labs: Lab Results  Component Value Date   HGBA1C 5.3 06/26/2017   HGBA1C 6.2 (H) 02/05/2017   HGBA1C 6.4 (H) 11/08/2015   ESRSEDRATE 99 (H) 07/21/2017   ESRSEDRATE 102 (H) 10/29/2015   ESRSEDRATE 120 (H) 08/09/2014   CRP 11.1 (H) 07/21/2017   CRP 1.9 (H) 03/12/2017   CRP 1.4 (H) 10/29/2015   REPTSTATUS 02/10/2017 FINAL 02/08/2017   GRAMSTAIN  02/06/2017    FEW WBC PRESENT, PREDOMINANTLY MONONUCLEAR NO ORGANISMS SEEN    CULT >=100,000 COLONIES/mL YEAST (A)  02/08/2017   LABORGA NO GROWTH 02/03/2017    @LABSALLVALUES (HGBA1)@  Body mass index is 20.36 kg/m.  Orders:  No orders of the defined types were placed in this encounter.  No orders of the defined types were placed in this encounter.    Procedures: No procedures performed  Clinical Data: No additional findings.  ROS:  All other systems negative, except as noted in the HPI. Review of Systems  Constitutional: Negative for chills and fever.  Musculoskeletal: Negative for myalgias.  Skin: Positive for wound. Negative for color change.    Objective: Vital Signs: Ht 5\' 7"  (1.702 m)   Wt 130 lb (59 kg)   LMP 03/16/2002   BMI 20.36 kg/m   Specialty Comments:  No specialty comments available.  PMFS History: Patient Active Problem List   Diagnosis Date Noted  . Pain   . Benign essential HTN   . Diabetes mellitus type 2 in obese ([place])   . Stage 5 chronic kidney disease not on chronic dialysis ([place])   . Above knee amputation status, left ([place])   . Acute blood loss anemia   . Chronic diastolic heart failure ([place])   . Post-operative pain   . Leukocytosis   . DM type 2 causing CKD stage 5 ([place]) 03/21/2017  . CKD (chronic kidney disease), stage V (  [place]) 03/17/2017  . Wound dehiscence 03/10/2017  . Chronic pain of left knee 03/05/2017  . Infection of prosthetic left knee joint ([place]) 02/07/2017  . AKI (acute kidney injury) ([place])   . Hemarthrosis   . Anemia of chronic disease 02/05/2017  . Diabetes mellitus, type II ([place]) 02/05/2017  . Dysplasia of cervix, high grade CIN 2 01/12/2017  . ASCUS with positive high risk HPV cervical 12/09/2016  . Chronic diastolic CHF (congestive heart failure) ([place]) 11/08/2015  . Total knee replacement status   . Diabetic peripheral neuropathy ([place])   . Anxiety state   . Depression   . Normocytic anemia 11/07/2015  . Chronic kidney disease (CKD), stage IV (severe) ([place])   . Hepatitis C 08/16/2014  . Peripheral neuropathy 06/24/2013    . 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 Essential hypertension 02/18/2013   Past Medical History:  Diagnosis Date  . Anemia   . Anxiety   . Arthritis    "in my joints" (03/10/2017)  . Chronic diastolic CHF (congestive heart failure) ([place])   . CKD (chronic kidney disease), stage IV ([place])    stage IV. previous HD, none currently 10/30/15 (confirmed 02/05/2017 & 03/10/2017)  . Depression    Chronic  . Diabetic peripheral neuropathy ([place])   . Dysrhythmia    tachycardia, normal ECHO 08-09-14  . GERD (gastroesophageal reflux disease)   . Heart murmur     dx'd 02/05/2017  . Hepatitis C    "tx'd in 2016; I'm negative now" (02/05/2017)  . History of blood transfusion 2017   "w/knee replacement"  . Hypertension   . Osteoarthritis of left knee   . Peripheral neuropathy   . Protein calorie malnutrition ([place])   . Septic arthritis ([place])   . Slow transit constipation   . Type II diabetes mellitus ([name])    IDDM  . Uncontrolled hypertension 02/18/2013  . Unsteady gait     Family History  Problem Relation Age of Onset  . Cancer Mother   . Heart disease Father   . Cancer Sister   . Hypertension Sister   . Cancer Brother   . Diabetes Brother   . Hypertension Brother     Past Surgical History:  Procedure Laterality Date  . AMPUTATION Left 03/20/2017   Procedure: LEFT ABOVE KNEE AMPUTATION;  Surgeon: Newt Minion, MD;  Location: [facility];  Service: Orthopedics;  Laterality: Left;  . APPENDECTOMY    . AV FISTULA PLACEMENT Left 09/13/2014   Procedure: Brachial Artery to Brachial Vein Gortex Four - Seven Stretch GRAFT INSERTION Left Forearm;  Surgeon: Mal Misty, MD;  Location: [facility];  Service: Vascular;  Laterality: Left;  . CHOLECYSTECTOMY OPEN    . COLON SURGERY    . EXCISIONAL TOTAL KNEE ARTHROPLASTY WITH ANTIBIOTIC SPACERS Left 02/06/2017   Procedure: Incisional total iknee with antibiotic spacer ;  Surgeon: Leandrew Koyanagi, MD;  Location: [facility];  Service: Orthopedics;  Laterality:  Left;  . IR FLUORO GUIDE CV LINE RIGHT  02/10/2017  . IR REMOVAL TUN CV CATH W/O FL  04/01/2017  . IR US GUIDE VASC ACCESS RIGHT  02/10/2017  . IRRIGATION AND DEBRIDEMENT KNEE Left 03/12/2017   Procedure: IRRIGATION AND DEBRIDEMENT LEFT KNEE WITH WOUND VAC APPLICATION;  Surgeon: Leandrew Koyanagi, MD;  Location: [facility];  Service: Orthopedics;  Laterality: Left;  . JOINT REPLACEMENT    . KNEE ARTHROSCOPY Right 08/10/2014   Procedure: ARTHROSCOPY I & D KNEE;  Surgeon: Marianna Payment, MD;  Location: [facility];  Service: Orthopedics;  Laterality: Right;  . KNEE ARTHROSCOPY Left 08/11/2014   Procedure: ARTHROSCOPIC WASHOUT LEFT KNEE;  Surgeon: Marianna Payment, MD;  Location: [facility];  Service: Orthopedics;  Laterality: Left;  . KNEE ARTHROSCOPY Left 08/19/2014   Procedure: ARTHROSCOPIC WASHOUT LEFT KNEE;  Surgeon: Leandrew Koyanagi, MD;  Location: [facility];  Service: Orthopedics;  Laterality: Left;  . KNEE ARTHROSCOPY WITH LATERAL MENISECTOMY Left 04/04/2015   Procedure: AND PARTIAL LATERAL MENISECTOMY;  Surgeon: Leandrew Koyanagi, MD;  Location: [facility];  Service: Orthopedics;  Laterality: Left;  . KNEE ARTHROSCOPY WITH MEDIAL MENISECTOMY Left 04/04/2015   Procedure: LEFT KNEE ARTHROSCOPY WITH PARTIAL MEDIAL MENISCECTOMY  AND SYNOVECTOMY;  Surgeon: Leandrew Koyanagi, MD;  Location: [facility];  Service: Orthopedics;  Laterality: Left;  . SHOULDER ARTHROSCOPY Bilateral 08/10/2014   Procedure: I & D BILATERAL SHOULDERS ;  Surgeon: Marianna Payment, MD;  Location: [facility];  Service: Orthopedics;  Laterality: Bilateral;  . SMALL INTESTINE SURGERY     Due to Small Bowel Obstruction; "fixed it when they did my gallbladder OR"  . TEE WITHOUT CARDIOVERSION N/A 08/14/2014   Procedure: TRANSESOPHAGEAL ECHOCARDIOGRAM (TEE);  Surgeon: Thayer Headings, MD;  Location: [facility];  Service: Cardiovascular;  Laterality: N/A;  . TENOSYNOVECTOMY Right 08/11/2014   Procedure: RIGHT WRIST IRRIGATION AND  DEBRIDEMENT, TENOSYNOVECTOMY;  Surgeon: Marianna Payment, MD;  Location: [facility];  Service: Orthopedics;  Laterality: Right;  . TOTAL KNEE ARTHROPLASTY Left 11/07/2015   Procedure: LEFT TOTAL KNEE ARTHROPLASTY WITH REVISION OF IMPLANTS;  Surgeon: Leandrew Koyanagi, MD;  Location: [facility];  Service: Orthopedics;  Laterality: Left;  . TUBAL LIGATION     Social History   Occupational History  . Not on file  Tobacco Use  . Smoking status: Current Every Day Smoker    Packs/day: 0.33    Years: 35.00    Pack years: 11.55    Types: Cigarettes  . Smokeless tobacco: Never Used  Substance and Sexual Activity  . Alcohol use: No    Alcohol/week: 0.0 oz    Comment: 03/10/2017 "I drink a wine cooler a few times/year"  . Drug use: No  . Sexual activity: No

## 2017-07-31 DIAGNOSIS — N185 Chronic kidney disease, stage 5: Secondary | ICD-10-CM | POA: Diagnosis not present

## 2017-07-31 DIAGNOSIS — E1122 Type 2 diabetes mellitus with diabetic chronic kidney disease: Secondary | ICD-10-CM | POA: Diagnosis not present

## 2017-07-31 DIAGNOSIS — E1142 Type 2 diabetes mellitus with diabetic polyneuropathy: Secondary | ICD-10-CM | POA: Diagnosis not present

## 2017-07-31 DIAGNOSIS — I5032 Chronic diastolic (congestive) heart failure: Secondary | ICD-10-CM | POA: Diagnosis not present

## 2017-07-31 DIAGNOSIS — T8781 Dehiscence of amputation stump: Secondary | ICD-10-CM | POA: Diagnosis not present

## 2017-07-31 DIAGNOSIS — I132 Hypertensive heart and chronic kidney disease with heart failure and with stage 5 chronic kidney disease, or end stage renal disease: Secondary | ICD-10-CM | POA: Diagnosis not present

## 2017-08-03 DIAGNOSIS — I5032 Chronic diastolic (congestive) heart failure: Secondary | ICD-10-CM | POA: Diagnosis not present

## 2017-08-03 DIAGNOSIS — E1142 Type 2 diabetes mellitus with diabetic polyneuropathy: Secondary | ICD-10-CM | POA: Diagnosis not present

## 2017-08-03 DIAGNOSIS — I132 Hypertensive heart and chronic kidney disease with heart failure and with stage 5 chronic kidney disease, or end stage renal disease: Secondary | ICD-10-CM | POA: Diagnosis not present

## 2017-08-03 DIAGNOSIS — E1122 Type 2 diabetes mellitus with diabetic chronic kidney disease: Secondary | ICD-10-CM | POA: Diagnosis not present

## 2017-08-03 DIAGNOSIS — N185 Chronic kidney disease, stage 5: Secondary | ICD-10-CM | POA: Diagnosis not present

## 2017-08-03 DIAGNOSIS — T8781 Dehiscence of amputation stump: Secondary | ICD-10-CM | POA: Diagnosis not present

## 2017-08-05 ENCOUNTER — Other Ambulatory Visit (INDEPENDENT_AMBULATORY_CARE_PROVIDER_SITE_OTHER): Payer: Self-pay | Admitting: Family

## 2017-08-05 ENCOUNTER — Telehealth (INDEPENDENT_AMBULATORY_CARE_PROVIDER_SITE_OTHER): Payer: Self-pay | Admitting: Orthopedic Surgery

## 2017-08-05 DIAGNOSIS — I132 Hypertensive heart and chronic kidney disease with heart failure and with stage 5 chronic kidney disease, or end stage renal disease: Secondary | ICD-10-CM | POA: Diagnosis not present

## 2017-08-05 DIAGNOSIS — T8781 Dehiscence of amputation stump: Secondary | ICD-10-CM | POA: Diagnosis not present

## 2017-08-05 DIAGNOSIS — E1122 Type 2 diabetes mellitus with diabetic chronic kidney disease: Secondary | ICD-10-CM | POA: Diagnosis not present

## 2017-08-05 DIAGNOSIS — N185 Chronic kidney disease, stage 5: Secondary | ICD-10-CM | POA: Diagnosis not present

## 2017-08-05 DIAGNOSIS — E1142 Type 2 diabetes mellitus with diabetic polyneuropathy: Secondary | ICD-10-CM | POA: Diagnosis not present

## 2017-08-05 DIAGNOSIS — I5032 Chronic diastolic (congestive) heart failure: Secondary | ICD-10-CM | POA: Diagnosis not present

## 2017-08-05 MED ORDER — HYDROCODONE-ACETAMINOPHEN 5-325 MG PO TABS: 1.0000 | ORAL_TABLET | Freq: Two times a day (BID) | ORAL | 0 refills | Status: DC | PRN

## 2017-08-05 NOTE — Telephone Encounter (Signed)
Pt was just in the office last week. AKA 03/2017 and states that she is having severe pain. She was negative for any signs of infection and is being followed by infectious disease. She is requesting a rx for pain medication " vicodin or percocet whichever is stronger"  What would you like to do

## 2017-08-05 NOTE — Telephone Encounter (Signed)
Patient called stating she is in severe pain, and would like a refill on her pain medication. Either oxycodone or Vicodin whichever is stronger she said. CB#  [phone]

## 2017-08-05 NOTE — Telephone Encounter (Signed)
Talked with patient and advised her that Rx is ready to be picked up at the front desk.

## 2017-08-11 ENCOUNTER — Encounter (HOSPITAL_COMMUNITY)
Admission: RE | Admit: 2017-08-11 | Discharge: 2017-08-11 | Disposition: A | Discharge status: Home / Self Care | Payer: Medicare Other | Source: Ambulatory Visit | Attending: Nephrology | Admitting: Nephrology

## 2017-08-11 VITALS — BP 160/74 | HR 73 | Temp 98.9°F

## 2017-08-11 DIAGNOSIS — N184 Chronic kidney disease, stage 4 (severe): Secondary | ICD-10-CM

## 2017-08-11 DIAGNOSIS — D631 Anemia in chronic kidney disease: Secondary | ICD-10-CM | POA: Insufficient documentation

## 2017-08-11 LAB — IRON AND TIBC
Iron: 63 ug/dL (ref 28–170)
Saturation Ratios: 27 % (ref 10.4–31.8)
TIBC: 237 ug/dL — ABNORMAL LOW (ref 250–450)
UIBC: 174 ug/dL

## 2017-08-11 LAB — FERRITIN: Ferritin: 421 ng/mL — ABNORMAL HIGH (ref 11–307)

## 2017-08-11 LAB — POCT HEMOGLOBIN-HEMACUE: Hemoglobin: 10.9 g/dL — ABNORMAL LOW (ref 12.0–15.0)

## 2017-08-11 MED ORDER — DARBEPOETIN ALFA 200 MCG/0.4ML IJ SOSY
200.0000 ug | PREFILLED_SYRINGE | INTRAMUSCULAR | Status: DC
  Administered 2017-08-11: 200 ug via SUBCUTANEOUS

## 2017-08-11 MED ORDER — DARBEPOETIN ALFA 200 MCG/0.4ML IJ SOSY
PREFILLED_SYRINGE | INTRAMUSCULAR | Status: AC
  Filled 2017-08-11: qty 0.4

## 2017-08-13 ENCOUNTER — Encounter: Payer: Self-pay | Admitting: Internal Medicine

## 2017-08-13 ENCOUNTER — Ambulatory Visit (INDEPENDENT_AMBULATORY_CARE_PROVIDER_SITE_OTHER): Payer: Medicare Other | Admitting: Internal Medicine

## 2017-08-13 DIAGNOSIS — T8454XD Infection and inflammatory reaction due to internal left knee prosthesis, subsequent encounter: Secondary | ICD-10-CM | POA: Diagnosis present

## 2017-08-13 NOTE — Assessment & Plan Note (Signed)
She has no current signs of active infection and her surgical incision has healed.  She will follow-up here in 6 weeks.

## 2017-08-13 NOTE — Progress Notes (Signed)
[facility] for Infectious Disease  Patient Active Problem List   Diagnosis Date Noted  . Above knee amputation status, left ([place])     Priority: High  . Infection of prosthetic left knee joint ([place]) 02/07/2017    Priority: High  . Pain   . Benign essential HTN   . Diabetes mellitus type 2 in obese ([place])   . Stage 5 chronic kidney disease not on chronic dialysis ([place])   . Acute blood loss anemia   . Chronic diastolic heart failure ([place])   . Post-operative pain   . Leukocytosis   . DM type 2 causing CKD stage 5 ([place]) 03/21/2017  . CKD (chronic kidney disease), stage V ([place]) 03/17/2017  . Wound dehiscence 03/10/2017  . AKI (acute kidney injury) ([place])   . Hemarthrosis   . Anemia of chronic disease 02/05/2017  . Diabetes mellitus, type II ([place]) 02/05/2017  . Dysplasia of cervix, high grade CIN 2 01/12/2017  . ASCUS with positive high risk HPV cervical 12/09/2016  . Chronic diastolic CHF (congestive heart failure) ([place]) 11/08/2015  . Total knee replacement status   . Diabetic peripheral neuropathy ([place])   . Anxiety state   . Depression   . Normocytic anemia 11/07/2015  . Chronic kidney disease (CKD), stage IV (severe) ([place])   . Hepatitis C 08/16/2014  . Peripheral neuropathy 06/24/2013  . 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 Essential hypertension 02/18/2013    Patient's Medications  New Prescriptions   No medications on file  Previous Medications   AMLODIPINE (NORVASC) 10 MG TABLET    Take 1 tablet (10 mg total) by mouth at bedtime.   BLOOD GLUCOSE MONITORING SUPPL (ACCU-CHEK AVIVA PLUS) W/DEVICE KIT    Check sugars TID for E11.65   CALCITRIOL (ROCALTROL) 0.25 MCG CAPSULE    Take 1 capsule (0.25 mcg total) by mouth every Monday, Wednesday, and Friday.   CALCIUM CARBONATE (TUMS EX) 750 MG CHEWABLE TABLET    Chew 1 tablet by mouth 3 (three) times daily as needed for heartburn.    CLONIDINE (CATAPRES) 0.2 MG TABLET    Take 0.2 mg by mouth 2 (two)  times daily.   FERROUS SULFATE 325 (65 FE) MG TABLET    Take 1 tablet (325 mg total) by mouth 2 (two) times daily with a meal.   FUROSEMIDE (LASIX) 80 MG TABLET    Take 1 tablet (80 mg total) by mouth 2 (two) times daily.   GABAPENTIN (NEURONTIN) 300 MG CAPSULE    Take 1 capsule (300 mg total) by mouth 3 (three) times daily.   GLUCOSE BLOOD (ACCU-CHEK AVIVA) TEST STRIP    Check sugars TID for E11.65   HYDRALAZINE (APRESOLINE) 25 MG TABLET    Take 3 tablets (75 mg total) by mouth every 6 (six) hours.   HYDROCODONE-ACETAMINOPHEN (NORCO/VICODIN) 5-325 MG TABLET    Take 1 tablet by mouth 2 (two) times daily as needed for moderate pain.   INSULIN GLARGINE (LANTUS) 100 UNIT/ML SOLOSTAR PEN    Inject 15 Units into the skin daily at 10 pm.   LANCET DEVICES (ACCU-CHEK SOFTCLIX) LANCETS    Check sugars TID for E11.65   LIDOCAINE (LIDODERM) 5 %    Place 1 patch onto the skin daily. Remove & Discard patch within 12 hours or as directed by MD   METHOCARBAMOL (ROBAXIN) 500 MG TABLET    TAKE 1 TABLET BY MOUTH EVERY 6 HOURS AS NEEDED FOR MUSCLE SPASM  METOPROLOL SUCCINATE (TOPROL-XL) 50 MG 24 HR TABLET    Take 1 tablet (50 mg total) by mouth 2 (two) times daily.   MULTIPLE VITAMIN (MULTIVITAMIN WITH MINERALS) TABS TABLET    Take 1 tablet by mouth daily.   OMEPRAZOLE (PRILOSEC) 20 MG CAPSULE    Take 20 mg by mouth daily before breakfast.    POLYETHYLENE GLYCOL (MIRALAX / GLYCOLAX) PACKET    Take 17 g by mouth 2 (two) times daily.   SERTRALINE (ZOLOFT) 100 MG TABLET    Take 1 tablet (100 mg total) by mouth daily.  Modified Medications   No medications on file  Discontinued Medications   DOXYCYCLINE (VIBRA-TABS) 100 MG TABLET    Take 1 tablet (100 mg total) by mouth 2 (two) times daily.   DOXYCYCLINE (VIBRAMYCIN) 100 MG CAPSULE    Take 1 capsule (100 mg total) by mouth 2 (two) times daily.    Subjective: [name] is in for her routine follow-up visit.  She completed doxycycline on 08/04/2017.  She is no longer  having any drainage from her left AKA wound.  She is having much less pain and is looking forward to her first visit to see about getting fitted with a prosthesis.  Review of Systems: Review of Systems  Constitutional: Negative for chills, diaphoresis and fever.  Gastrointestinal: Negative for abdominal pain, diarrhea, nausea and vomiting.  Musculoskeletal: Negative for joint pain.    Past Medical History:  Diagnosis Date  . Anemia   . Anxiety   . Arthritis    "in my joints" (03/10/2017)  . Chronic diastolic CHF (congestive heart failure) ([place])   . CKD (chronic kidney disease), stage IV ([place])    stage IV. previous HD, none currently 10/30/15 (confirmed 02/05/2017 & 03/10/2017)  . Depression    Chronic  . Diabetic peripheral neuropathy ([place])   . Dysrhythmia    tachycardia, normal ECHO 08-09-14  . GERD (gastroesophageal reflux disease)   . Heart murmur     dx'd 02/05/2017  . Hepatitis C    "tx'd in 2016; I'm negative now" (02/05/2017)  . History of blood transfusion 2017   "w/knee replacement"  . Hypertension   . Osteoarthritis of left knee   . Peripheral neuropathy   . Protein calorie malnutrition ([place])   . Septic arthritis ([place])   . Slow transit constipation   . Type II diabetes mellitus ([name])    IDDM  . Uncontrolled hypertension 02/18/2013  . Unsteady gait     Social History   Tobacco Use  . Smoking status: Current Every Day Smoker    Packs/day: 0.33    Years: 35.00    Pack years: 11.55    Types: Cigarettes  . Smokeless tobacco: Never Used  Substance Use Topics  . Alcohol use: No    Alcohol/week: 0.0 oz    Comment: 03/10/2017 "I drink a wine cooler a few times/year"  . Drug use: No    Family History  Problem Relation Age of Onset  . Cancer Mother   . Heart disease Father   . Cancer Sister   . Hypertension Sister   . Cancer Brother   . Diabetes Brother   . Hypertension Brother     Allergies  Allergen Reactions  . Compazine [Prochlorperazine] Shortness Of  Breath and Swelling    TONGUE SWELLS  . Shellfish-Derived Products Anaphylaxis  . Iodinated Diagnostic Agents Hives and Rash  . Omnipaque [Iohexol] Hives    Objective: Vitals:   08/13/17 0938  BP: [name])  168/79  Pulse: 69  Temp: 98.1 F (36.7 C)  TempSrc: Oral   There is no height or weight on file to calculate BMI.  Physical Exam  Constitutional:  Seated in a wheelchair.  She is in good spirits.  Musculoskeletal:  Her left AKA incision is fully healed.  There is no more drainage.  There is no redness, swelling, warmth or pain with palpation.    Lab Results    Problem List Items Addressed This Visit      High   Infection of prosthetic left knee joint ([place])    She has no current signs of active infection and her surgical incision has healed.  She will follow-up here in 6 weeks.          Michel Bickers, MD [facility] [phone] pager   [phone] cell 08/13/2017, 9:53 AM

## 2017-08-15 ENCOUNTER — Other Ambulatory Visit (INDEPENDENT_AMBULATORY_CARE_PROVIDER_SITE_OTHER): Payer: Self-pay | Admitting: Orthopaedic Surgery

## 2017-08-25 ENCOUNTER — Encounter: Payer: Self-pay | Admitting: Family Medicine

## 2017-08-25 ENCOUNTER — Ambulatory Visit (INDEPENDENT_AMBULATORY_CARE_PROVIDER_SITE_OTHER): Payer: Medicare Other | Admitting: Family Medicine

## 2017-08-25 ENCOUNTER — Other Ambulatory Visit: Payer: Self-pay

## 2017-08-25 VITALS — BP 138/80 | HR 69 | Temp 97.6°F | Resp 16 | Ht 67.0 in | Wt 142.0 lb

## 2017-08-25 DIAGNOSIS — E1122 Type 2 diabetes mellitus with diabetic chronic kidney disease: Secondary | ICD-10-CM | POA: Diagnosis not present

## 2017-08-25 DIAGNOSIS — Z1231 Encounter for screening mammogram for malignant neoplasm of breast: Secondary | ICD-10-CM | POA: Diagnosis not present

## 2017-08-25 DIAGNOSIS — F339 Major depressive disorder, recurrent, unspecified: Secondary | ICD-10-CM | POA: Diagnosis not present

## 2017-08-25 DIAGNOSIS — N184 Chronic kidney disease, stage 4 (severe): Secondary | ICD-10-CM | POA: Diagnosis not present

## 2017-08-25 DIAGNOSIS — Z89612 Acquired absence of left leg above knee: Secondary | ICD-10-CM | POA: Diagnosis not present

## 2017-08-25 DIAGNOSIS — IMO0002 Reserved for concepts with insufficient information to code with codable children: Secondary | ICD-10-CM

## 2017-08-25 DIAGNOSIS — E1142 Type 2 diabetes mellitus with diabetic polyneuropathy: Secondary | ICD-10-CM | POA: Diagnosis not present

## 2017-08-25 DIAGNOSIS — I5032 Chronic diastolic (congestive) heart failure: Secondary | ICD-10-CM | POA: Diagnosis not present

## 2017-08-25 DIAGNOSIS — N185 Chronic kidney disease, stage 5: Secondary | ICD-10-CM | POA: Diagnosis not present

## 2017-08-25 DIAGNOSIS — Z1239 Encounter for other screening for malignant neoplasm of breast: Secondary | ICD-10-CM

## 2017-08-25 LAB — POCT GLYCOSYLATED HEMOGLOBIN (HGB A1C): Hemoglobin A1C: 5.2

## 2017-08-25 MED ORDER — ZOSTER VAC RECOMB ADJUVANTED 50 MCG/0.5ML IM SUSR: 0.5000 mL | Freq: Once | INTRAMUSCULAR | 0 refills | Status: AC

## 2017-08-25 MED ORDER — INSULIN GLARGINE 100 UNIT/ML SOLOSTAR PEN: 13.0000 [IU] | PEN_INJECTOR | Freq: Every day | SUBCUTANEOUS | 11 refills | Status: DC

## 2017-08-25 NOTE — Progress Notes (Signed)
Subjective  CC:  Chief Complaint  Patient presents with  . Establish Care    Transfer from [place]    HPI: Brandy Houston is a 62 y.o. female who presents to [place] at [facility] today to establish care with me as a new patient. She is a former [facility] patient and is here to reestablish care with me today.   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 I have reviewed records from ortho and ID notes. Now healed and post op infection has resolved. Soon to be fitted with prosthesis. Feeling good. Has a positive outlook  DM: has been well controlled over last year. Checks sugars daily; fastings are variable ranging from 90-120s.  She admits to having an occasional hypoglycemic episode with some sweating.  She has hypoglycemic awareness.  This happens rarely.  She is on Lantus 15 units nightly.  Hypertension: Now well controlled.  Medications have been adjusted by renal.  CHF: Has been stable over the last year.  No recent exacerbations.  She denies chest pain or shortness of breath.  No lower extremity edema on the right.  Depression on Zoloft.  Well-controlled in spite of recent stressors.  She continues to smoke, she is a light smoker.  Health maintenance: Will be due for next Pap smear in July.  Status post LEEP for high-grade SIL lesion identified last year.  Due for shingles vaccination.  Due for mammogram.  Overdue for eye exam.  Due for annual wellness visit  We updated and reviewed the patient's past history in detail and it is documented below.  Patient Active Problem List   Diagnosis Date Noted  . Benign essential HTN   . Stage 5 chronic kidney disease not on chronic dialysis ([place])   . Above knee amputation status, left ([place])   . 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 Anemia of chronic disease 02/05/2017  . Dysplasia of cervix, high grade CIN 2 01/12/2017  . Chronic diastolic CHF (congestive  heart failure) ([place]) 11/08/2015  . Total knee replacement status   . Diabetic peripheral neuropathy ([place])   . Major depression, recurrent, chronic ([place])   . Chronic kidney disease (CKD), stage IV (severe) ([place])   . Hepatitis C 08/16/2014  . Peripheral neuropathy 06/24/2013  . Compulsive tobacco user syndrome 06/24/2013   Health Maintenance  Topic Date Due  . MAMMOGRAM  09/26/1972  . OPHTHALMOLOGY EXAM  01/23/2017  . HEMOGLOBIN A1C  12/24/2017  . FOOT EXAM  08/16/2018  . PNEUMOCOCCAL POLYSACCHARIDE VACCINE (2) 08/09/2019  . PAP SMEAR  08/22/2019  . COLONOSCOPY  07/02/2023  . TETANUS/TDAP  08/26/2026  . INFLUENZA VACCINE  Completed  . Hepatitis C Screening  Completed  . HIV Screening  Completed   Immunization History  Administered Date(s) Administered  . Hepatitis B, adult 11/07/2014, 12/12/2014  . Hepatitis B, ped/adol 11/07/2014, 12/12/2014  . Influenza Inj Mdck Quad Pf 08/08/2014  . Influenza, Seasonal, Injecte, Preservative Fre 02/08/2015, 02/13/2016  . Influenza,inj,Quad PF,6+ Mos 08/08/2014, 04/01/2017  . PPD Test 11/13/2015  . Pneumococcal Polysaccharide-23 08/08/2014  . Pneumococcal-Unspecified 08/08/2014  . Tdap 08/25/2016   Current Meds  Medication Sig  . amLODipine (NORVASC) 10 MG tablet Take 1 tablet (10 mg total) by mouth at bedtime.  . Blood Glucose Monitoring Suppl (ACCU-CHEK AVIVA PLUS) W/DEVICE KIT Check sugars TID for E11.65  . calcitRIOL (ROCALTROL) 0.25 MCG capsule Take 1 capsule (  0.25 mcg total) by mouth every Monday, Wednesday, and Friday.  . calcium carbonate (TUMS EX) 750 MG chewable tablet Chew 1 tablet by mouth 3 (three) times daily as needed for heartburn.   . cloNIDine (CATAPRES) 0.2 MG tablet Take 0.2 mg by mouth 2 (two) times daily.  . ferrous sulfate 325 (65 FE) MG tablet Take 1 tablet (325 mg total) by mouth 2 (two) times daily with a meal.  . gabapentin (NEURONTIN) 300 MG capsule Take 1 capsule (300 mg total) by mouth 3 (three) times daily.    [name] glucose blood (ACCU-CHEK AVIVA) test strip Check sugars TID for E11.65  . hydrALAZINE (APRESOLINE) 25 MG tablet Take 3 tablets (75 mg total) by mouth every 6 (six) hours.  [name] HYDROcodone-acetaminophen (NORCO/VICODIN) 5-325 MG tablet Take 1 tablet by mouth 2 (two) times daily as needed for moderate pain.  . Insulin Glargine (LANTUS) 100 UNIT/ML Solostar Pen Inject 13 Units into the skin daily at 10 pm.  . [name] Devices (ACCU-CHEK SOFTCLIX) lancets Check sugars TID for E11.65  . methocarbamol (ROBAXIN) 500 MG tablet TAKE 1 TABLET BY MOUTH EVERY 6 HOURS AS NEEDED FOR MUSCLE SPASM  . metoprolol succinate (TOPROL-XL) 50 MG 24 hr tablet Take 1 tablet (50 mg total) by mouth 2 (two) times daily.  . Multiple Vitamin (MULTIVITAMIN WITH MINERALS) TABS tablet Take 1 tablet by mouth daily.  . polyethylene glycol (MIRALAX / GLYCOLAX) packet Take 17 g by mouth 2 (two) times daily.  . sertraline (ZOLOFT) 100 MG tablet Take 1 tablet (100 mg total) by mouth daily.  . [DISCONTINUED] Insulin Glargine (LANTUS) 100 UNIT/ML Solostar Pen Inject 15 Units into the skin daily at 10 pm.    Allergies: Patient is allergic to compazine [prochlorperazine]; shellfish-derived products; iodinated diagnostic agents; and omnipaque [iohexol]. Past Medical History Patient  has a past medical history of Anemia, Anxiety, Arthritis, Chronic diastolic CHF (congestive heart failure) ([place]), CKD (chronic kidney disease), stage IV ([place]), Depression, Diabetic peripheral neuropathy ([place]), Dysrhythmia, GERD (gastroesophageal reflux disease), Heart murmur, Hepatitis C, History of blood transfusion (2017), Hypertension, Osteoarthritis of left knee, Peripheral neuropathy, Protein calorie malnutrition ([place]), Septic arthritis ([place]), Slow transit constipation, Type II diabetes mellitus ([place]), Uncontrolled hypertension (02/18/2013), and Unsteady gait. Past Surgical History Patient  has a past surgical history that includes Appendectomy; Small intestine  surgery; Tubal ligation; Knee arthroscopy (Right, 08/10/2014); Shoulder arthroscopy (Bilateral, 08/10/2014); Tenosynovectomy (Right, 08/11/2014); Knee arthroscopy (Left, 08/11/2014); TEE without cardioversion (N/A, 08/14/2014); Knee arthroscopy (Left, 08/19/2014); AV fistula placement (Left, 09/13/2014); Knee arthroscopy with medial menisectomy (Left, 04/04/2015); Knee arthroscopy with lateral menisectomy (Left, 04/04/2015); Total knee arthroplasty (Left, 11/07/2015); Excisional total knee arthroplasty with antibiotic spacers (Left, 02/06/2017); IR Fluoro Guide CV Line Right (02/10/2017); IR US Guide Vasc Access Right (02/10/2017); Joint replacement; Cholecystectomy open; Irrigation and debridement knee (Left, 03/12/2017); Amputation (Left, 03/20/2017); Colon surgery; and IR Removal Tun Cv Cath W/O FL (04/01/2017). Family History: Patient family history includes Cancer in her brother, mother, and sister; Diabetes in her brother; Heart disease in her father; Hypertension in her brother and sister. Social History:  Patient  reports that she has been smoking cigarettes.  She has a 11.55 pack-year smoking history. she has never used smokeless tobacco. She reports that she does not drink alcohol or use drugs.  Review of Systems: Constitutional: negative for fever or malaise Ophthalmic: negative for photophobia, double vision or loss of vision Cardiovascular: negative for chest pain, dyspnea on exertion, or new LE swelling Respiratory: negative for SOB or persistent cough  Gastrointestinal: negative for abdominal pain, change in bowel habits or melena Genitourinary: negative for dysuria or gross hematuria Musculoskeletal: negative for new gait disturbance or muscular weakness Integumentary: negative for new or persistent rashes Neurological: negative for TIA or stroke symptoms Psychiatric: negative for SI or delusions Allergic/Immunologic: negative for hives  Patient Care Team    Relationship Specialty Notifications  Start End  [name], MD PCP - General Family Medicine  03/31/17   Leandrew Koyanagi, MD Attending Physician Orthopedic Surgery  07/20/15     Objective  Vitals: BP 138/80   Pulse 69   Temp 97.6 F (36.4 C) (Oral)   Resp 16   Ht _0  (1.702 m) Comment: patient reported  Wt 142 lb (64.4 kg)   LMP 03/16/2002   SpO2 98%   BMI 22.24 kg/m  General:  Well developed, well nourished, no acute distress, comfortable in wheelchair Psych:  Alert and oriented,normal mood and affect HEENT:  Normocephalic, atraumatic, non-icteric sclera, PERRL, oropharynx is without mass or exudate, supple neck without adenopathy, mass or thyromegaly Cardiovascular:  RRR without gallop, rub, 3/6 systolic murmur, no lower extremity edema respiratory:  Good breath sounds bilaterally, CTAB with normal respiratory effort Gastrointestinal: normal bowel sounds, soft, non-tender, no noted masses. No HSM  Assessment  1. 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 Stage 5 chronic kidney disease not on chronic dialysis ([place])   3. Diabetic peripheral neuropathy ([place])   4. Chronic kidney disease (CKD), stage IV (severe) ([place])   5. Chronic diastolic CHF (congestive heart failure) ([place])   6. Major depression, recurrent, chronic ([place])   7. Above knee amputation status, left ([place])   8. Breast cancer screening       Lab Results  Component Value Date   HGBA1C 5.2 08/25/2017    Plan   Diabetes is well controlled.  Want to avoid hypoglycemia.  Decrease Lantus back to 13 units nightly.  Chronic renal disease per nephrology.  On Aranesp injections for anemia of chronic disease.  Not yet dialysis dependent  Heart failure is stable.  Depression is well controlled  Health maintenance: Ordered mammogram.  Shingrix vaccination printed for patient to get at pharmacy.  Annual wellness visit recommended.  Follow up:  Return in about 3 months (around 11/25/2017) for follow up of diabetes and hypertension, AWV at patient's  convenience.  Commons side effects, risks, benefits, and alternatives for medications and treatment plan prescribed today were discussed, and the patient expressed understanding of the given instructions. Patient is instructed to call or message via MyChart if he/she has any questions or concerns regarding our treatment plan. No barriers to understanding were identified. We discussed Red Flag symptoms and signs in detail. Patient expressed understanding regarding what to do in case of urgent or emergency type symptoms.   Medication list was reconciled, printed and provided to the patient in AVS. Patient instructions and summary information was reviewed with the patient as documented in the AVS. This note was prepared with assistance of Dragon voice recognition software. Occasional wrong-word or sound-a-like substitutions may have occurred due to the inherent limitations of voice recognition software  Orders Placed This Encounter  Procedures  . MM DIGITAL SCREENING BILATERAL  . POCT glycosylated hemoglobin (Hb A1C)   Meds ordered this encounter  Medications  . Insulin Glargine (LANTUS) 100 UNIT/ML Solostar Pen    Sig: Inject 13 Units into the skin daily at 10 pm.    Dispense:  15 mL  Refill:  11  . Zoster Vaccine Adjuvanted [facility]) injection    Sig: Inject 0.5 mLs into the muscle once for 1 dose. Please give 2nd dose 2-6 months after first dose    Dispense:  2 each    Refill:  0

## 2017-08-25 NOTE — Patient Instructions (Addendum)
It was so good seeing you again! Thank you for establishing with my new practice and allowing me to continue caring for you. It means a lot to me.   Please schedule a follow up appointment with me in 3 months for recheck.  Medicare recommends an Annual Wellness Visit for all patients. Please schedule this to be done with our Nurse Educator, [name]. This is an informative "talk" visit; it's goals are to ensure that your health care needs are being met and to give you education regarding avoiding falls, ensuring you are not suffering from depression or problems with memory or thinking, and to educate you on Advance Care Planning. It helps me take good care of you!  Please schedule an eye appointment to check your eyes.

## 2017-09-01 DIAGNOSIS — N2581 Secondary hyperparathyroidism of renal origin: Secondary | ICD-10-CM | POA: Diagnosis not present

## 2017-09-01 DIAGNOSIS — E1122 Type 2 diabetes mellitus with diabetic chronic kidney disease: Secondary | ICD-10-CM | POA: Diagnosis not present

## 2017-09-01 DIAGNOSIS — N049 Nephrotic syndrome with unspecified morphologic changes: Secondary | ICD-10-CM | POA: Diagnosis not present

## 2017-09-01 DIAGNOSIS — I129 Hypertensive chronic kidney disease with stage 1 through stage 4 chronic kidney disease, or unspecified chronic kidney disease: Secondary | ICD-10-CM | POA: Diagnosis not present

## 2017-09-01 DIAGNOSIS — E1129 Type 2 diabetes mellitus with other diabetic kidney complication: Secondary | ICD-10-CM | POA: Diagnosis not present

## 2017-09-01 DIAGNOSIS — N184 Chronic kidney disease, stage 4 (severe): Secondary | ICD-10-CM | POA: Diagnosis not present

## 2017-09-01 DIAGNOSIS — G629 Polyneuropathy, unspecified: Secondary | ICD-10-CM | POA: Diagnosis not present

## 2017-09-01 DIAGNOSIS — D631 Anemia in chronic kidney disease: Secondary | ICD-10-CM | POA: Diagnosis not present

## 2017-09-01 LAB — BASIC METABOLIC PANEL
BUN: 66 — AB (ref 4–21)
Creatinine: 3.7 — AB (ref 0.5–1.1)
Glucose: 57
Potassium: 3.8 (ref 3.4–5.3)
Sodium: 138 (ref 137–147)

## 2017-09-07 ENCOUNTER — Encounter: Payer: Self-pay | Admitting: Emergency Medicine

## 2017-09-08 ENCOUNTER — Encounter (HOSPITAL_COMMUNITY)
Admission: RE | Admit: 2017-09-08 | Discharge: 2017-09-08 | Disposition: A | Discharge status: Home / Self Care | Payer: Medicare Other | Source: Ambulatory Visit | Attending: Nephrology | Admitting: Nephrology

## 2017-09-08 VITALS — BP 179/74 | HR 65 | Temp 97.8°F | Resp 18

## 2017-09-08 DIAGNOSIS — D631 Anemia in chronic kidney disease: Secondary | ICD-10-CM | POA: Insufficient documentation

## 2017-09-08 DIAGNOSIS — N184 Chronic kidney disease, stage 4 (severe): Secondary | ICD-10-CM | POA: Diagnosis not present

## 2017-09-08 LAB — FERRITIN: Ferritin: 305 ng/mL (ref 11–307)

## 2017-09-08 LAB — IRON AND TIBC
Iron: 72 ug/dL (ref 28–170)
Saturation Ratios: 26 % (ref 10.4–31.8)
TIBC: 279 ug/dL (ref 250–450)
UIBC: 207 ug/dL

## 2017-09-08 MED ORDER — DARBEPOETIN ALFA 200 MCG/0.4ML IJ SOSY
200.0000 ug | PREFILLED_SYRINGE | INTRAMUSCULAR | Status: DC
  Administered 2017-09-08: 200 ug via SUBCUTANEOUS

## 2017-09-08 MED ORDER — DARBEPOETIN ALFA 200 MCG/0.4ML IJ SOSY
PREFILLED_SYRINGE | INTRAMUSCULAR | Status: AC
  Filled 2017-09-08: qty 0.4

## 2017-09-09 ENCOUNTER — Other Ambulatory Visit: Payer: Self-pay

## 2017-09-09 DIAGNOSIS — N184 Chronic kidney disease, stage 4 (severe): Secondary | ICD-10-CM

## 2017-09-09 LAB — POCT HEMOGLOBIN-HEMACUE: Hemoglobin: 11.8 g/dL — ABNORMAL LOW (ref 12.0–15.0)

## 2017-09-10 ENCOUNTER — Other Ambulatory Visit (INDEPENDENT_AMBULATORY_CARE_PROVIDER_SITE_OTHER): Payer: Self-pay | Admitting: Family

## 2017-09-14 ENCOUNTER — Encounter (HOSPITAL_COMMUNITY): Payer: Medicare Other

## 2017-09-15 ENCOUNTER — Encounter (HOSPITAL_COMMUNITY): Payer: Medicare Other

## 2017-09-15 DIAGNOSIS — Z1231 Encounter for screening mammogram for malignant neoplasm of breast: Secondary | ICD-10-CM | POA: Diagnosis not present

## 2017-09-15 LAB — HM MAMMOGRAPHY

## 2017-09-16 ENCOUNTER — Telehealth (INDEPENDENT_AMBULATORY_CARE_PROVIDER_SITE_OTHER): Payer: Self-pay | Admitting: Orthopedic Surgery

## 2017-09-16 ENCOUNTER — Encounter: Payer: Self-pay | Admitting: Emergency Medicine

## 2017-09-16 NOTE — Telephone Encounter (Signed)
OV Notes 07/02/2017-present forwarded to [facility] clinic

## 2017-09-17 ENCOUNTER — Encounter (HOSPITAL_COMMUNITY): Payer: Medicare Other

## 2017-09-24 ENCOUNTER — Telehealth (INDEPENDENT_AMBULATORY_CARE_PROVIDER_SITE_OTHER): Payer: Self-pay | Admitting: Orthopedic Surgery

## 2017-09-24 ENCOUNTER — Other Ambulatory Visit (INDEPENDENT_AMBULATORY_CARE_PROVIDER_SITE_OTHER): Payer: Self-pay

## 2017-09-24 DIAGNOSIS — Z89612 Acquired absence of left leg above knee: Secondary | ICD-10-CM

## 2017-09-24 NOTE — Telephone Encounter (Signed)
Patient called asking for a referral to [facility] PT for rehab. CB # [phone]

## 2017-09-24 NOTE — Telephone Encounter (Signed)
Last 2 ov notes along with CMN faxed to [facility] clinic [phone]

## 2017-09-24 NOTE — Telephone Encounter (Signed)
Called and advised pt tht referral to [facility] neuro rehab made today and she can call to make appt.

## 2017-09-29 ENCOUNTER — Ambulatory Visit: Payer: Medicare Other | Admitting: Internal Medicine

## 2017-10-07 ENCOUNTER — Other Ambulatory Visit: Payer: Self-pay

## 2017-10-07 ENCOUNTER — Encounter: Payer: Self-pay | Admitting: Physical Therapy

## 2017-10-07 ENCOUNTER — Ambulatory Visit: Payer: Medicare Other | Attending: Orthopedic Surgery | Admitting: Physical Therapy

## 2017-10-07 DIAGNOSIS — R2681 Unsteadiness on feet: Secondary | ICD-10-CM | POA: Insufficient documentation

## 2017-10-07 DIAGNOSIS — M6249 Contracture of muscle, multiple sites: Secondary | ICD-10-CM | POA: Diagnosis not present

## 2017-10-07 DIAGNOSIS — M6281 Muscle weakness (generalized): Secondary | ICD-10-CM | POA: Insufficient documentation

## 2017-10-07 DIAGNOSIS — R2689 Other abnormalities of gait and mobility: Secondary | ICD-10-CM | POA: Diagnosis not present

## 2017-10-07 NOTE — Therapy (Signed)
[facility] [address] [place], [place], [address] Phone: [phone]   Fax:  [phone]  Physical Therapy Evaluation  Patient Details  Name: Brandy Houston MRN: [id number] Date of Birth: [birth year] Referring Provider: Meridee Score, MD   Encounter Date: 10/07/2017  PT End of Session - 10/07/17 1307    Visit Number  1    Number of Visits  25    Date for PT Re-Evaluation  01/05/18    Authorization Type  Medicare & Medicaid    PT Start Time  [phone]    PT Stop Time  0930    PT Time Calculation (min)  39 min    Equipment Utilized During Treatment  Gait belt    Activity Tolerance  Patient tolerated treatment well    Behavior During Therapy  [facility] for tasks assessed/performed       Past Medical History:  Diagnosis Date  . Anemia   . Anxiety   . Arthritis    "in my joints" (03/10/2017)  . Chronic diastolic CHF (congestive heart failure) ([place])   . CKD (chronic kidney disease), stage IV ([place])    stage IV. previous HD, none currently 10/30/15 (confirmed 02/05/2017 & 03/10/2017)  . Depression    Chronic  . Diabetic peripheral neuropathy ([place])   . Dysrhythmia    tachycardia, normal ECHO 08-09-14  . GERD (gastroesophageal reflux disease)   . Heart murmur     dx'd 02/05/2017  . Hepatitis C    "tx'd in 2016; I'm negative now" (02/05/2017)  . History of blood transfusion 2017   "w/knee replacement"  . Hypertension   . Osteoarthritis of left knee   . Peripheral neuropathy   . Protein calorie malnutrition ([place])   . Septic arthritis ([place])   . Slow transit constipation   . Type II diabetes mellitus ([name])    IDDM  . Uncontrolled hypertension 02/18/2013  . Unsteady gait     Past Surgical History:  Procedure Laterality Date  . AMPUTATION Left 03/20/2017   Procedure: LEFT ABOVE KNEE AMPUTATION;  Surgeon: Newt Minion, MD;  Location: [facility];  Service: Orthopedics;  Laterality: Left;  . APPENDECTOMY    . AV FISTULA PLACEMENT Left  09/13/2014   Procedure: Brachial Artery to Brachial Vein Gortex Four - Seven Stretch GRAFT INSERTION Left Forearm;  Surgeon: Mal Misty, MD;  Location: [facility];  Service: Vascular;  Laterality: Left;  . CHOLECYSTECTOMY OPEN    . COLON SURGERY    . EXCISIONAL TOTAL KNEE ARTHROPLASTY WITH ANTIBIOTIC SPACERS Left 02/06/2017   Procedure: Incisional total iknee with antibiotic spacer ;  Surgeon: Leandrew Koyanagi, MD;  Location: [facility];  Service: Orthopedics;  Laterality: Left;  . IR FLUORO GUIDE CV LINE RIGHT  02/10/2017  . IR REMOVAL TUN CV CATH W/O FL  04/01/2017  . IR US GUIDE VASC ACCESS RIGHT  02/10/2017  . IRRIGATION AND DEBRIDEMENT KNEE Left 03/12/2017   Procedure: IRRIGATION AND DEBRIDEMENT LEFT KNEE WITH WOUND VAC APPLICATION;  Surgeon: Leandrew Koyanagi, MD;  Location: [facility];  Service: Orthopedics;  Laterality: Left;  . JOINT REPLACEMENT    . KNEE ARTHROSCOPY Right 08/10/2014   Procedure: ARTHROSCOPY I & D KNEE;  Surgeon: Marianna Payment, MD;  Location: [facility];  Service: Orthopedics;  Laterality: Right;  . KNEE ARTHROSCOPY Left 08/11/2014   Procedure: ARTHROSCOPIC WASHOUT LEFT KNEE;  Surgeon: Marianna Payment, MD;  Location: [facility];  Service: Orthopedics;  Laterality: Left;  . KNEE  ARTHROSCOPY Left 08/19/2014   Procedure: ARTHROSCOPIC WASHOUT LEFT KNEE;  Surgeon: Leandrew Koyanagi, MD;  Location: [facility];  Service: Orthopedics;  Laterality: Left;  . KNEE ARTHROSCOPY WITH LATERAL MENISECTOMY Left 04/04/2015   Procedure: AND PARTIAL LATERAL MENISECTOMY;  Surgeon: Leandrew Koyanagi, MD;  Location: [facility];  Service: Orthopedics;  Laterality: Left;  . KNEE ARTHROSCOPY WITH MEDIAL MENISECTOMY Left 04/04/2015   Procedure: LEFT KNEE ARTHROSCOPY WITH PARTIAL MEDIAL MENISCECTOMY  AND SYNOVECTOMY;  Surgeon: Leandrew Koyanagi, MD;  Location: [facility];  Service: Orthopedics;  Laterality: Left;  . SHOULDER ARTHROSCOPY Bilateral 08/10/2014   Procedure: I & D BILATERAL SHOULDERS ;  Surgeon:  Marianna Payment, MD;  Location: [facility];  Service: Orthopedics;  Laterality: Bilateral;  . SMALL INTESTINE SURGERY     Due to Small Bowel Obstruction; "fixed it when they did my gallbladder OR"  . TEE WITHOUT CARDIOVERSION N/A 08/14/2014   Procedure: TRANSESOPHAGEAL ECHOCARDIOGRAM (TEE);  Surgeon: Thayer Headings, MD;  Location: [facility];  Service: Cardiovascular;  Laterality: N/A;  . TENOSYNOVECTOMY Right 08/11/2014   Procedure: RIGHT WRIST IRRIGATION AND DEBRIDEMENT, TENOSYNOVECTOMY;  Surgeon: Marianna Payment, MD;  Location: [facility];  Service: Orthopedics;  Laterality: Right;  . TOTAL KNEE ARTHROPLASTY Left 11/07/2015   Procedure: LEFT TOTAL KNEE ARTHROPLASTY WITH REVISION OF IMPLANTS;  Surgeon: Leandrew Koyanagi, MD;  Location: [facility];  Service: Orthopedics;  Laterality: Left;  . TUBAL LIGATION      There were no vitals filed for this visit.   Subjective Assessment - 10/07/17 0855    Subjective  This 62yo female was referred on 09/24/2017 by Meridee Score, MD for left Transfemoral Amputation. She had a left Total Knee Arthroplasty on 11/07/2015 that became infected & she underwent a left Transfemoral Amputation on 03/20/17. She recieved first prosthesis 09/30/1017.     Pertinent History  left TFA, DM, neuropathy, Hep C, HTN, CHF, depression, CKD st 5 not dialysis, arthritis, heart murmur,     Limitations  Standing;Walking;House hold activities    Patient Stated Goals  To walk with prosthesis in home & community, travel plane, car & cruise in September    Currently in Pain?  No/denies         [facility] PT Assessment - 10/07/17 0845      Assessment   Medical Diagnosis  Left Transfemoral Amputation    Referring Provider  Meridee Score, MD    Onset Date/Surgical Date  09/30/17 prosthesis delivery    Hand Dominance  Right      Precautions   Precautions  Fall    Precaution Comments  NO BP LUE      Balance Screen   Has the patient fallen in the past 6 months  No    Has the patient had a decrease  in activity level because of a fear of falling?   Yes    Is the patient reluctant to leave their home because of a fear of falling?   Yes      [place]  Private residence    [place] adult son, his girlfriend & her 82yo 18yo children    Type of [place] to enter    Entrance Stairs-Number of Steps  3    Entrance Stairs-Rails  Left    [place]  Two level;Able to live on main level with bedroom/bathroom;Full bath on main level son lives  on 2nd floor    Alternate Level Stairs-Number of Steps  14    Alternate Level Stairs-Rails  None    Home Equipment  Walker - 2 wheels;Walker - 4 wheels;Cane - single point;Bedside commode;Tub bench;Wheelchair - manual BSC used as raised toilet seat      Prior Function   Level of Independence  Independent prior to knee replace cane limited by pain, RW since    Vocation  On disability    Leisure  traveling,       Posture/Postural Control   Posture/Postural Control  Postural limitations    Postural Limitations  Rounded Shoulders;Forward head;Flexed trunk;Increased lumbar lordosis;Weight shift right      ROM / Strength   AROM / PROM / Strength  AROM;Strength      AROM   Overall AROM   Deficits    Overall AROM Comments  left hip flexors, right hamstrings & gastroc appear tight      Strength   Overall Strength  Deficits    Overall Strength Comments  gross testing sitting in w/c: left hip 3-4/5, right hip ~4/5, right knee 5/5, right ankle DF 5/5,  BUEs grossly 4/5      Transfers   Transfers  Sit to Stand;Stand to Sit    Sit to Stand  5: Supervision;With upper extremity assist;With armrests;From chair/3-in-1 uses RW &  back of legs against stable chair to stabilize    Sit to Stand Details (indicate cue type and reason)  Manual & verbal cues to control prosthetic knee    Stand to Sit  5: Supervision;With upper extremity assist;With armrests;To chair/3-in-1 from RW & back of legs  against chair.     Stand to Sit Details  Manual & verbal cues to control prosthetic knee      Ambulation/Gait   Ambulation/Gait  Yes    Ambulation/Gait Assistance  3: Mod assist    Ambulation/Gait Assistance Details  excessive UE weight bearing on RW, intermittent manual assist to extend prosthetic knee,     Ambulation Distance (Feet)  30 Feet    Assistive device  Rolling walker;Prosthesis    Gait Pattern  Step-to pattern;Decreased step length - right;Decreased stance time - left;Decreased stride length;Decreased hip/knee flexion - left;Decreased weight shift to left;Left hip hike;Left circumduction;Left flexed knee in stance;Antalgic;Lateral hip instability;Trunk flexed;Abducted - left;Poor foot clearance - left    Ambulation Surface  Indoor;Level    Gait velocity  0.35 ft/sec      Balance   Balance Assessed  Yes      Static Standing Balance   Static Standing - Balance Support  Bilateral upper extremity supported BUE on RW    Static Standing - Level of Assistance  5: Stand by assistance    Static Standing - Comment/# of Minutes  maintains upright 2 min once PT assisted to center of mass over feet & prosthetic knee extended.       Dynamic Standing Balance   Dynamic Standing - Balance Support  Bilateral upper extremity supported;Left upper extremity supported BUE for scanning & LUE support for reaching    Dynamic Standing - Level of Assistance  4: Min assist    Dynamic Standing - Comments  reaches 2" anteriorly & turns head to side only to look over shoulders.       Prosthetics Assessment - 10/07/17 0845      Prosthetics   Prosthetic Care Dependent with  Skin check;Residual limb care;Care of non-amputated limb;Prosthetic cleaning;Ply sock cleaning;Correct ply sock adjustment;Proper wear schedule/adjustment;Proper weight-bearing schedule/adjustment  Donning prosthesis   Min assist;Supervision supervision seated portion, MinA standing tighten suspension    Doffing prosthesis    Supervision    Current prosthetic wear tolerance (days/week)   4 of 7 days since delivery    Current prosthetic wear tolerance (#hours/day)   2 hrs    Current prosthetic weight-bearing tolerance (hours/day)   Patient reports limb pain 5/10 with gait activities.     Edema  pitting with 3-4 sec refill    Residual limb condition   dry skin, no open areas, scar mildly adhered to distal femur, normal temperature & color               Objective measurements completed on examination: See above findings.                PT Short Term Goals - 10/06/17 1643      PT SHORT TERM GOAL #1   Title  Patient correctly donnes prosthesis including tightening suspension strap standing with RW or counter modified independent. (All STGs Target Date: 11/06/2017)    Time  1    Period  Months    Status  New    Target Date  11/06/17      PT SHORT TERM GOAL #2   Title  Patient tolerates prosthesis wear >8hrs total /day without skin issues.     Time  1    Period  Months    Status  New    Target Date  11/06/17      PT SHORT TERM GOAL #3   Title  Patient standing balance with RW & prosthesis reaches 10" anteriorly, to floor and manages clothes safely modified independent.     Time  1    Period  Months    Status  New    Target Date  11/06/17      PT SHORT TERM GOAL #4   Title  Patient ambulates 100' with RW & prosthesis with supervision.     Time  1    Period  Months    Status  New    Target Date  11/06/17      PT SHORT TERM GOAL #5   Title  Patient negotiates stairs with 2 rails, ramp & curb with RW & prosthesis with minA.     Time  1    Period  Months    Status  New    Target Date  11/06/17        PT Long Term Goals - 10/07/17 1437      PT LONG TERM GOAL #1   Title  Patient verbalizes & demonstrates proper prosthetic care to enable safe use of prosthesis. (All LTGs Target Date: 01/01/2018)    Time  12    Period  Weeks    Status  New    Target Date  01/01/18      PT  LONG TERM GOAL #2   Title  Patient tolerates prosthesis wear >80% of awake hours without skin issues or limb pain to enable function throughout her day.     Time  12    Period  Weeks    Status  New    Target Date  01/01/18      PT LONG TERM GOAL #3   Title  Patient ambulates 500' outdoors including grass with LRAD & prosthesis modified independent for community mobility.     Time  12    Period  Weeks    Status  New  Target Date  01/01/18      PT LONG TERM GOAL #4   Title  Patient negotiates ramps, curbs & stairs with LRAD & prosthesis modified independent to enable community mobility.     Time  12    Period  Weeks    Status  New    Target Date  01/01/18      PT LONG TERM GOAL #5   Title  Berg Balance Test >/= 30/56    Time  12    Period  Weeks    Status  New    Target Date  01/01/18             Plan - 10/07/17 1420    Clinical Impression Statement  This 62yo female was limited in mobility with knee osteoarthritis that required a total knee arthroplasty 11/07/2015 which became infected and she underwent a left Transfemoral Amputation on 03/20/2017. Patient has deconditioning & weakness from limited, impaired mobility & activity level for 2 years. She recieved her first prosthesis on 09/30/2017 and is dependent in proper care & use. She is only wearing the prosthesis 4 of 7 days since delivery for 2 hours, which limits activity level. Patient has impaired standing balance requiring sturdy support like rolling walker for static and minimal assist for dynamic reaching. Patient is dependent in prosthetic gait with rolling walker requiring moderate assist with gait velocity of 0.35 ft/sec and deviations increasing fall risk. Patient would benefit from skilled physical therapy to improve mobility and safety.                  History and Personal Factors relevant to plan of care:  left TFA, DM, neuropathy, Hep C, HTN, CHF, depression, CKD st 5 not dialysis, arthritis, heart murmur,      Clinical Presentation  Evolving    Clinical Presentation due to:  high fall risk, dependent with prosthesis, DM, neuropathy, CHF, CKD stage 5    Clinical Decision Making  Moderate    Rehab Potential  Good    Clinical Impairments Affecting Rehab Potential  transfemoral level amputation with multiple medical issues, significantly limited activity level for >2 years    PT Frequency  2x / week    PT Duration  12 weeks    PT Treatment/Interventions  ADLs/Self Care Home Management;Moist Heat;DME Instruction;Gait training;Stair training;Functional mobility training;Therapeutic activities;Therapeutic exercise;Balance training;Neuromuscular re-education;Patient/family education;Prosthetic Training;Manual techniques;Vestibular    PT Next Visit Plan  prosthetic care education, HEP at sink, prosthetic gait with RW    Consulted and Agree with Plan of Care  Patient       Patient will benefit from skilled therapeutic intervention in order to improve the following deficits and impairments:  Abnormal gait, Decreased activity tolerance, Decreased balance, Decreased endurance, Decreased knowledge of use of DME, Decreased mobility, Decreased strength, Decreased range of motion, Impaired flexibility, Prosthetic Dependency, Postural dysfunction  Visit Diagnosis: Muscle weakness (generalized)  Other abnormalities of gait and mobility  Contracture of muscle, multiple sites  Unsteadiness on feet     Problem List Patient Active Problem List   Diagnosis Date Noted  . Benign essential HTN   . Stage 5 chronic kidney disease not on chronic dialysis ([place])   . Above knee amputation status, left ([place])   . DM type 2 causing CKD stage 5 ([place]) 03/21/2017  . Anemia of chronic disease 02/05/2017  . Dysplasia of cervix, high grade CIN 2 01/12/2017  . Chronic diastolic CHF (congestive heart failure) ([place]) 11/08/2015  . Total knee  replacement status   . Diabetic peripheral neuropathy ([place])   . Major depression,  recurrent, chronic ([place])   . Chronic kidney disease (CKD), stage IV (severe) ([place])   . Hepatitis C 08/16/2014  . Peripheral neuropathy 06/24/2013  . Compulsive tobacco user syndrome 06/24/2013    Jamey Reas PT, DPT 10/07/2017, 4:48 PM  [facility] [address] [place], [place], [address] Phone: [phone]   Fax:  [phone]  Name: Brandy Houston MRN: [id number] Date of Birth: [birth year]

## 2017-10-08 ENCOUNTER — Other Ambulatory Visit (INDEPENDENT_AMBULATORY_CARE_PROVIDER_SITE_OTHER): Payer: Self-pay | Admitting: Orthopedic Surgery

## 2017-10-08 DIAGNOSIS — I129 Hypertensive chronic kidney disease with stage 1 through stage 4 chronic kidney disease, or unspecified chronic kidney disease: Secondary | ICD-10-CM | POA: Diagnosis not present

## 2017-10-08 DIAGNOSIS — N049 Nephrotic syndrome with unspecified morphologic changes: Secondary | ICD-10-CM | POA: Diagnosis not present

## 2017-10-08 DIAGNOSIS — N184 Chronic kidney disease, stage 4 (severe): Secondary | ICD-10-CM | POA: Diagnosis not present

## 2017-10-08 DIAGNOSIS — E1122 Type 2 diabetes mellitus with diabetic chronic kidney disease: Secondary | ICD-10-CM | POA: Diagnosis not present

## 2017-10-08 DIAGNOSIS — G629 Polyneuropathy, unspecified: Secondary | ICD-10-CM | POA: Diagnosis not present

## 2017-10-08 DIAGNOSIS — D631 Anemia in chronic kidney disease: Secondary | ICD-10-CM | POA: Diagnosis not present

## 2017-10-08 DIAGNOSIS — N2581 Secondary hyperparathyroidism of renal origin: Secondary | ICD-10-CM | POA: Diagnosis not present

## 2017-10-09 ENCOUNTER — Encounter: Payer: Self-pay | Admitting: Physical Therapy

## 2017-10-09 ENCOUNTER — Ambulatory Visit: Payer: Medicare Other | Admitting: Physical Therapy

## 2017-10-09 DIAGNOSIS — R2681 Unsteadiness on feet: Secondary | ICD-10-CM | POA: Diagnosis not present

## 2017-10-09 DIAGNOSIS — M6281 Muscle weakness (generalized): Secondary | ICD-10-CM

## 2017-10-09 DIAGNOSIS — R2689 Other abnormalities of gait and mobility: Secondary | ICD-10-CM

## 2017-10-09 DIAGNOSIS — M6249 Contracture of muscle, multiple sites: Secondary | ICD-10-CM | POA: Diagnosis not present

## 2017-10-09 NOTE — Patient Instructions (Signed)
Do each exercise 1-2  times per day Do each exercise 5-10 repetitions Hold each exercise for 2-3 seconds to feel your location  AT [place].  USE TAPE ON FLOOR TO MARK THE MIDLINE POSITION. You also should try to feel with your limb pressure in socket.  You are trying to feel with limb what you used to feel with the bottom of your foot.  1. Side to Side Shift: Moving your hips only (not shoulders): move weight onto your left leg, HOLD/FEEL.  Move back to equal weight on each leg, HOLD/FEEL. Move weight onto your right leg, HOLD/FEEL. Move back to equal weight on each leg, HOLD/FEEL. Repeat.  Do with both hands holding sink 5-10 times, hold with left hand only 5-10 times, hover hands so not holding with either SMALL movements 5-10 times.  2. Front to Back Shift: Moving your hips only (not shoulders): move your weight forward onto your toes, HOLD/FEEL. Move your weight back to equal Flat Foot on both legs, HOLD/FEEL. Move your weight back onto your heels, HOLD/FEEL. Move your weight back to equal on both legs, HOLD/FEEL. Repeat.  Do with both hands holding sink 5-10 times, hold with left hand only 5-10 times, hover hands so not holding with either SMALL movements 5-10 times.  3. Moving Cones / Cups: With equal weight on each leg: Hold on with one hand the first time, then progress to no hand supports. Move cups from one side of sink to the other. Place cups ~2" out of your reach, progress to 10" beyond reach. 4. Overhead/Upward Reaching: alternated reaching up to top cabinets or ceiling if no cabinets present. Keep equal weight on each leg. Start with one hand support on counter while other hand reaches and progress to no hand support with reaching. 5.   Looking Over Shoulders: With equal weight on each leg: alternate turning to look  over your shoulders with one hand support on counter as needed. Shift weight to other side looking,  pull hip then shoulder then head/eyes around to look behind you. Start       with one hand support & progress to no hand support.

## 2017-10-10 NOTE — Therapy (Signed)
[facility] [address] [place], [place], [address] Phone: [phone]   Fax:  [phone]  Physical Therapy Treatment  Patient Details  Name: Brandy Houston MRN: [id number] Date of Birth: December 28, 1955 Referring Provider: Meridee Score, MD   Encounter Date: 10/09/2017  PT End of Session - 10/09/17 1636    Visit Number  2    Number of Visits  25    Date for PT Re-Evaluation  01/05/18    Authorization Type  Medicare & Medicaid    PT Start Time  1230    PT Stop Time  1313    PT Time Calculation (min)  43 min    Equipment Utilized During Treatment  Gait belt    Activity Tolerance  Patient tolerated treatment well    Behavior During Therapy  [facility] for tasks assessed/performed       Past Medical History:  Diagnosis Date  . Anemia   . Anxiety   . Arthritis    "in my joints" (03/10/2017)  . Chronic diastolic CHF (congestive heart failure) ([place])   . CKD (chronic kidney disease), stage IV ([place])    stage IV. previous HD, none currently 10/30/15 (confirmed 02/05/2017 & 03/10/2017)  . Depression    Chronic  . Diabetic peripheral neuropathy ([place])   . Dysrhythmia    tachycardia, normal ECHO 08-09-14  . GERD (gastroesophageal reflux disease)   . Heart murmur     dx'd 02/05/2017  . Hepatitis C    "tx'd in 2016; I'm negative now" (02/05/2017)  . History of blood transfusion 2017   "w/knee replacement"  . Hypertension   . Osteoarthritis of left knee   . Peripheral neuropathy   . Protein calorie malnutrition ([place])   . Septic arthritis ([place])   . Slow transit constipation   . Type II diabetes mellitus ([name])    IDDM  . Uncontrolled hypertension 02/18/2013  . Unsteady gait     Past Surgical History:  Procedure Laterality Date  . AMPUTATION Left 03/20/2017   Procedure: LEFT ABOVE KNEE AMPUTATION;  Surgeon: Newt Minion, MD;  Location: [facility];  Service: Orthopedics;  Laterality: Left;  . APPENDECTOMY    . AV FISTULA PLACEMENT Left  09/13/2014   Procedure: Brachial Artery to Brachial Vein Gortex Four - Seven Stretch GRAFT INSERTION Left Forearm;  Surgeon: Mal Misty, MD;  Location: [facility];  Service: Vascular;  Laterality: Left;  . CHOLECYSTECTOMY OPEN    . COLON SURGERY    . EXCISIONAL TOTAL KNEE ARTHROPLASTY WITH ANTIBIOTIC SPACERS Left 02/06/2017   Procedure: Incisional total iknee with antibiotic spacer ;  Surgeon: Leandrew Koyanagi, MD;  Location: [facility];  Service: Orthopedics;  Laterality: Left;  . IR FLUORO GUIDE CV LINE RIGHT  02/10/2017  . IR REMOVAL TUN CV CATH W/O FL  04/01/2017  . IR US GUIDE VASC ACCESS RIGHT  02/10/2017  . IRRIGATION AND DEBRIDEMENT KNEE Left 03/12/2017   Procedure: IRRIGATION AND DEBRIDEMENT LEFT KNEE WITH WOUND VAC APPLICATION;  Surgeon: Leandrew Koyanagi, MD;  Location: [facility];  Service: Orthopedics;  Laterality: Left;  . JOINT REPLACEMENT    . KNEE ARTHROSCOPY Right 08/10/2014   Procedure: ARTHROSCOPY I & D KNEE;  Surgeon: Marianna Payment, MD;  [facility];  Service: Orthopedics;  Laterality: Right;  . KNEE ARTHROSCOPY Left 08/11/2014   Procedure: ARTHROSCOPIC WASHOUT LEFT KNEE;  Surgeon: Marianna Payment, MD;  Location: [facility];  Service: Orthopedics;  Laterality: Left;  . KNEE  ARTHROSCOPY Left 08/19/2014   Procedure: ARTHROSCOPIC WASHOUT LEFT KNEE;  Surgeon: Leandrew Koyanagi, MD;  Location: [facility];  Service: Orthopedics;  Laterality: Left;  . KNEE ARTHROSCOPY WITH LATERAL MENISECTOMY Left 04/04/2015   Procedure: AND PARTIAL LATERAL MENISECTOMY;  Surgeon: Leandrew Koyanagi, MD;  Location: [facility];  Service: Orthopedics;  Laterality: Left;  . KNEE ARTHROSCOPY WITH MEDIAL MENISECTOMY Left 04/04/2015   Procedure: LEFT KNEE ARTHROSCOPY WITH PARTIAL MEDIAL MENISCECTOMY  AND SYNOVECTOMY;  Surgeon: Leandrew Koyanagi, MD;  Location: [facility];  Service: Orthopedics;  Laterality: Left;  . SHOULDER ARTHROSCOPY Bilateral 08/10/2014   Procedure: I & D BILATERAL SHOULDERS ;  Surgeon:  Marianna Payment, MD;  [facility];  Service: Orthopedics;  Laterality: Bilateral;  . SMALL INTESTINE SURGERY     Due to Small Bowel Obstruction; "fixed it when they did my gallbladder OR"  . TEE WITHOUT CARDIOVERSION N/A 08/14/2014   Procedure: TRANSESOPHAGEAL ECHOCARDIOGRAM (TEE);  Surgeon: Thayer Headings, MD;  Location: [facility];  Service: Cardiovascular;  Laterality: N/A;  . TENOSYNOVECTOMY Right 08/11/2014   Procedure: RIGHT WRIST IRRIGATION AND DEBRIDEMENT, TENOSYNOVECTOMY;  Surgeon: Marianna Payment, MD;  Location: [facility];  Service: Orthopedics;  Laterality: Right;  . TOTAL KNEE ARTHROPLASTY Left 11/07/2015   Procedure: LEFT TOTAL KNEE ARTHROPLASTY WITH REVISION OF IMPLANTS;  Surgeon: Leandrew Koyanagi, MD;  Location: [facility];  Service: Orthopedics;  Laterality: Left;  . TUBAL LIGATION      There were no vitals filed for this visit.  Subjective Assessment - 10/09/17 1230    Subjective  She has worn prosthesis 2hrs but only 1x/day due to doctor appointment.     Pertinent History  left TFA, DM, neuropathy, Hep C, HTN, CHF, depression, CKD st 5 not dialysis, arthritis, heart murmur,     Limitations  Standing;Walking;House hold activities    Patient Stated Goals  To walk with prosthesis in home & community, travel plane, car & cruise in September    Currently in Pain?  No/denies       Prosthetic Training:  Pt arrived seated in w/c with left legrest with prosthesis donned except suspension strap not full tightened. Sit to stand w/c to RW with minA & PT assisting prosthetic knee control. Pt able to reach prosthetic strap with single UE support on RW with minA. Standing HEP at sink. PT instructed in determining midline with feet even distance from midline of sink then shifting pelvis over feet.  PT advised for standing activities with pelvis weight shift to midpoint over feet with stationery standing activities.  HEP at sink with lateral weight shifts, ant/post weight shifts, reaching  right & left moving objects across midline, overhead reaching and trunk rotation to look over shoulders.                            PT Short Term Goals - 10/06/17 1643      PT SHORT TERM GOAL #1   Title  Patient correctly donnes prosthesis including tightening suspension strap standing with RW or counter modified independent. (All STGs Target Date: 11/06/2017)    Time  1    Period  Months    Status  New    Target Date  11/06/17      PT SHORT TERM GOAL #2   Title  Patient tolerates prosthesis wear >8hrs total /day without skin issues.     Time  1  Period  Months    Status  New    Target Date  11/06/17      PT SHORT TERM GOAL #3   Title  Patient standing balance with RW & prosthesis reaches 10" anteriorly, to floor and manages clothes safely modified independent.     Time  1    Period  Months    Status  New    Target Date  11/06/17      PT SHORT TERM GOAL #4   Title  Patient ambulates 100' with RW & prosthesis with supervision.     Time  1    Period  Months    Status  New    Target Date  11/06/17      PT SHORT TERM GOAL #5   Title  Patient negotiates stairs with 2 rails, ramp & curb with RW & prosthesis with minA.     Time  1    Period  Months    Status  New    Target Date  11/06/17        PT Long Term Goals - 10/07/17 1437      PT LONG TERM GOAL #1   Title  Patient verbalizes & demonstrates proper prosthetic care to enable safe use of prosthesis. (All LTGs Target Date: 01/01/2018)    Time  12    Period  Weeks    Status  New    Target Date  01/01/18      PT LONG TERM GOAL #2   Title  Patient tolerates prosthesis wear >80% of awake hours without skin issues or limb pain to enable function throughout her day.     Time  12    Period  Weeks    Status  New    Target Date  01/01/18      PT LONG TERM GOAL #3   Title  Patient ambulates 500' outdoors including grass with LRAD & prosthesis modified independent for community mobility.     Time   12    Period  Weeks    Status  New    Target Date  01/01/18      PT LONG TERM GOAL #4   Title  Patient negotiates ramps, curbs & stairs with LRAD & prosthesis modified independent to enable community mobility.     Time  12    Period  Weeks    Status  New    Target Date  01/01/18      PT LONG TERM GOAL #5   Title  Berg Balance Test >/= 30/56    Time  12    Period  Weeks    Status  New    Target Date  01/01/18            Plan - 10/09/17 1640    Clinical Impression Statement  Patient seems to understand HEP at sink to work on standing balance, strength, standing tolerance & proprioception.     Rehab Potential  Good    Clinical Impairments Affecting Rehab Potential  transfemoral level amputation with multiple medical issues, significantly limited activity level for >2 years    PT Frequency  2x / week    PT Duration  12 weeks    PT Treatment/Interventions  ADLs/Self Care Home Management;Moist Heat;DME Instruction;Gait training;Stair training;Functional mobility training;Therapeutic activities;Therapeutic exercise;Balance training;Neuromuscular re-education;Patient/family education;Prosthetic Training;Manual techniques;Vestibular    PT Next Visit Plan  prosthetic care education, prosthetic gait with RW    Consulted and Agree with Plan of Care  Patient       Patient will benefit from skilled therapeutic intervention in order to improve the following deficits and impairments:  Abnormal gait, Decreased activity tolerance, Decreased balance, Decreased endurance, Decreased knowledge of use of DME, Decreased mobility, Decreased strength, Decreased range of motion, Impaired flexibility, Prosthetic Dependency, Postural dysfunction  Visit Diagnosis: Muscle weakness (generalized)  Other abnormalities of gait and mobility  Unsteadiness on feet     Problem List Patient Active Problem List   Diagnosis Date Noted  . Benign essential HTN   . Stage 5 chronic kidney disease not on  chronic dialysis ([place])   . Above knee amputation status, left ([place])   . DM type 2 causing CKD stage 5 ([place]) 03/21/2017  . Anemia of chronic disease 02/05/2017  . Dysplasia of cervix, high grade CIN 2 01/12/2017  . Chronic diastolic CHF (congestive heart failure) ([place]) 11/08/2015  . Total knee replacement status   . Diabetic peripheral neuropathy ([place])   . Major depression, recurrent, chronic ([place])   . Chronic kidney disease (CKD), stage IV (severe) ([place])   . Hepatitis C 08/16/2014  . Peripheral neuropathy 06/24/2013  . Compulsive tobacco user syndrome 06/24/2013    Jamey Reas PT, DPT 10/10/2017, 4:43 PM  [facility] [address] [place], [place], [address] Phone: [phone]   Fax:  [phone]  Name: Brandy Houston MRN: [id number] Date of Birth: 16-Jul-1955

## 2017-10-14 ENCOUNTER — Ambulatory Visit (INDEPENDENT_AMBULATORY_CARE_PROVIDER_SITE_OTHER): Payer: Medicare Other | Admitting: Vascular Surgery

## 2017-10-14 ENCOUNTER — Ambulatory Visit (HOSPITAL_COMMUNITY)
Admission: RE | Admit: 2017-10-14 | Discharge: 2017-10-14 | Disposition: A | Discharge status: Home / Self Care | Payer: Medicare Other | Source: Ambulatory Visit | Attending: Vascular Surgery | Admitting: Vascular Surgery

## 2017-10-14 ENCOUNTER — Encounter: Payer: Self-pay | Admitting: Vascular Surgery

## 2017-10-14 ENCOUNTER — Ambulatory Visit (INDEPENDENT_AMBULATORY_CARE_PROVIDER_SITE_OTHER)
Admission: RE | Admit: 2017-10-14 | Discharge: 2017-10-14 | Disposition: A | Discharge status: Home / Self Care | Payer: Medicare Other | Source: Ambulatory Visit | Attending: Vascular Surgery | Admitting: Vascular Surgery

## 2017-10-14 ENCOUNTER — Other Ambulatory Visit: Payer: Self-pay | Admitting: *Deleted

## 2017-10-14 ENCOUNTER — Encounter: Payer: Self-pay | Admitting: *Deleted

## 2017-10-14 ENCOUNTER — Other Ambulatory Visit (HOSPITAL_COMMUNITY): Payer: Self-pay | Admitting: *Deleted

## 2017-10-14 VITALS — BP 179/79 | HR 62 | Temp 97.4°F | Resp 18 | Ht 67.0 in | Wt 148.0 lb

## 2017-10-14 DIAGNOSIS — N184 Chronic kidney disease, stage 4 (severe): Secondary | ICD-10-CM

## 2017-10-14 NOTE — H&P (View-Only) (Signed)
Requested by:  Jamal Maes, MD [address] [place], [place] [address]  Reason for consultation: New access   History of Present Illness   Brandy Houston is a 62 y.o. ([birth year]) female who presents for evaluation for permanent access.  The patient is right hand dominant.  The patient has had previous access procedures: L FA AVG.  Previous central venous cannulation procedures include: RIJV TDC and RIJV TLC.  The patient has never had a PPM placed.    Past Medical History:  Diagnosis Date  . Anemia   . Anxiety   . Arthritis    "in my joints" (03/10/2017)  . Chronic diastolic CHF (congestive heart failure) ([place])   . CKD (chronic kidney disease), stage IV ([place])    stage IV. previous HD, none currently 10/30/15 (confirmed 02/05/2017 & 03/10/2017)  . Depression    Chronic  . Diabetic peripheral neuropathy ([place])   . Dysrhythmia    tachycardia, normal ECHO 08-09-14  . GERD (gastroesophageal reflux disease)   . Heart murmur     dx'd 02/05/2017  . Hepatitis C    "tx'd in 2016; I'm negative now" (02/05/2017)  . History of blood transfusion 2017   "w/knee replacement"  . Hypertension   . Osteoarthritis of left knee   . Peripheral neuropathy   . Protein calorie malnutrition ([place])   . Septic arthritis ([place])   . Slow transit constipation   . Type II diabetes mellitus ([name])    IDDM  . Uncontrolled hypertension 02/18/2013  . Unsteady gait     Past Surgical History:  Procedure Laterality Date  . AMPUTATION Left 03/20/2017   Procedure: LEFT ABOVE KNEE AMPUTATION;  Surgeon: Newt Minion, MD;  Location: [facility];  Service: Orthopedics;  Laterality: Left;  . APPENDECTOMY    . AV FISTULA PLACEMENT Left 09/13/2014   Procedure: Brachial Artery to Brachial Vein Gortex Four - Seven Stretch GRAFT INSERTION Left Forearm;  Surgeon: Mal Misty, MD;  Location: [facility];  Service: Vascular;  Laterality: Left;  . CHOLECYSTECTOMY OPEN    . COLON SURGERY    . EXCISIONAL TOTAL KNEE ARTHROPLASTY WITH  ANTIBIOTIC SPACERS Left 02/06/2017   Procedure: Incisional total iknee with antibiotic spacer ;  Surgeon: Leandrew Koyanagi, MD;  Location: [facility];  Service: Orthopedics;  Laterality: Left;  . IR FLUORO GUIDE CV LINE RIGHT  02/10/2017  . IR REMOVAL TUN CV CATH W/O FL  04/01/2017  . IR US GUIDE VASC ACCESS RIGHT  02/10/2017  . IRRIGATION AND DEBRIDEMENT KNEE Left 03/12/2017   Procedure: IRRIGATION AND DEBRIDEMENT LEFT KNEE WITH WOUND VAC APPLICATION;  Surgeon: Leandrew Koyanagi, MD;  Location: [facility];  Service: Orthopedics;  Laterality: Left;  . JOINT REPLACEMENT    . KNEE ARTHROSCOPY Right 08/10/2014   Procedure: ARTHROSCOPY I & D KNEE;  Surgeon: Marianna Payment, MD;  [facility];  Service: Orthopedics;  Laterality: Right;  . KNEE ARTHROSCOPY Left 08/11/2014   Procedure: ARTHROSCOPIC WASHOUT LEFT KNEE;  Surgeon: Marianna Payment, MD;  Location: [facility];  Service: Orthopedics;  Laterality: Left;  . KNEE ARTHROSCOPY Left 08/19/2014   Procedure: ARTHROSCOPIC WASHOUT LEFT KNEE;  Surgeon: Leandrew Koyanagi, MD;  Location: [facility];  Service: Orthopedics;  Laterality: Left;  . KNEE ARTHROSCOPY WITH LATERAL MENISECTOMY Left 04/04/2015   Procedure: AND PARTIAL LATERAL MENISECTOMY;  Surgeon: Leandrew Koyanagi, MD;  Location: [facility];  Service: Orthopedics;  Laterality: Left;  . KNEE ARTHROSCOPY WITH MEDIAL MENISECTOMY  Left 04/04/2015   Procedure: LEFT KNEE ARTHROSCOPY WITH PARTIAL MEDIAL MENISCECTOMY  AND SYNOVECTOMY;  Surgeon: Leandrew Koyanagi, MD;  Location: [facility];  Service: Orthopedics;  Laterality: Left;  . SHOULDER ARTHROSCOPY Bilateral 08/10/2014   Procedure: I & D BILATERAL SHOULDERS ;  Surgeon: Marianna Payment, MD;  [facility];  Service: Orthopedics;  Laterality: Bilateral;  . SMALL INTESTINE SURGERY     Due to Small Bowel Obstruction; "fixed it when they did my gallbladder OR"  . TEE WITHOUT CARDIOVERSION N/A 08/14/2014   Procedure: TRANSESOPHAGEAL ECHOCARDIOGRAM (TEE);   Surgeon: Thayer Headings, MD;  Location: [facility];  Service: Cardiovascular;  Laterality: N/A;  . TENOSYNOVECTOMY Right 08/11/2014   Procedure: RIGHT WRIST IRRIGATION AND DEBRIDEMENT, TENOSYNOVECTOMY;  Surgeon: Marianna Payment, MD;  Location: [facility];  Service: Orthopedics;  Laterality: Right;  . TOTAL KNEE ARTHROPLASTY Left 11/07/2015   Procedure: LEFT TOTAL KNEE ARTHROPLASTY WITH REVISION OF IMPLANTS;  Surgeon: Leandrew Koyanagi, MD;  Location: [facility];  Service: Orthopedics;  Laterality: Left;  . TUBAL LIGATION      Social History   Socioeconomic History  . Marital status: Single    Spouse name: Not on file  . Number of children: Not on file  . Years of education: Not on file  . Highest education level: Not on file  Occupational History  . Not on file  Social Needs  . Financial resource strain: Not on file  . Food insecurity:    Worry: Not on file    Inability: Not on file  . Transportation needs:    Medical: Not on file    Non-medical: Not on file  Tobacco Use  . Smoking status: Current Every Day Smoker    Packs/day: 0.33    Years: 35.00    Pack years: 11.55    Types: Cigarettes  . Smokeless tobacco: Never Used  Substance and Sexual Activity  . Alcohol use: No    Alcohol/week: 0.0 oz    Comment: 03/10/2017 "I drink a wine cooler a few times/year"  . Drug use: No  . Sexual activity: Never  Lifestyle  . Physical activity:    Days per week: Not on file    Minutes per session: Not on file  . Stress: Not on file  Relationships  . Social connections:    Talks on phone: Not on file    Gets together: Not on file    Attends religious service: Not on file    Active member of club or organization: Not on file    Attends meetings of clubs or organizations: Not on file    Relationship status: Not on file  . Intimate partner violence:    Fear of current or ex partner: Not on file    Emotionally abused: Not on file    Physically abused: Not on file    Forced sexual activity:  Not on file  Other Topics Concern  . Not on file  Social History Narrative  . Not on file    Family History  Problem Relation Age of Onset  . Cancer Mother   . Heart disease Father   . Cancer Sister   . Hypertension Sister   . Cancer Brother   . Diabetes Brother   . Hypertension Brother     Current Outpatient Medications  Medication Sig Dispense Refill  . amLODipine (NORVASC) 10 MG tablet Take 1 tablet (10 mg total) by mouth at bedtime. 30 tablet 0  . Blood  Glucose Monitoring Suppl (ACCU-CHEK AVIVA PLUS) W/DEVICE KIT Check sugars TID for E11.65 1 kit 0  . calcitRIOL (ROCALTROL) 0.25 MCG capsule Take 1 capsule (0.25 mcg total) by mouth every Monday, Wednesday, and Friday. 90 capsule 0  . calcium carbonate (TUMS EX) 750 MG chewable tablet Chew 1 tablet by mouth 3 (three) times daily as needed for heartburn.     . cloNIDine (CATAPRES) 0.2 MG tablet Take 0.2 mg by mouth 2 (two) times daily.    . ferrous sulfate 325 (65 FE) MG tablet Take 1 tablet (325 mg total) by mouth 2 (two) times daily with a meal. 60 tablet 3  . furosemide (LASIX) 80 MG tablet Take 1 tablet (80 mg total) by mouth 2 (two) times daily. 60 tablet 0  . gabapentin (NEURONTIN) 300 MG capsule TAKE 1 CAPSULE BY MOUTH THREE TIMES DAILY 90 capsule 0  . glucose blood (ACCU-CHEK AVIVA) test strip Check sugars TID for E11.65 100 each 12  . hydrALAZINE (APRESOLINE) 25 MG tablet Take 3 tablets (75 mg total) by mouth every 6 (six) hours. 180 tablet 0  . HYDROcodone-acetaminophen (NORCO/VICODIN) 5-325 MG tablet Take 1 tablet by mouth 2 (two) times daily as needed for moderate pain. (Patient not taking: Reported on 10/07/2017) 20 tablet 0  . Insulin Glargine (LANTUS) 100 UNIT/ML Solostar Pen Inject 13 Units into the skin daily at 10 pm. 15 mL 11  . Lancet Devices (ACCU-CHEK SOFTCLIX) lancets Check sugars TID for E11.65 1 each 10  . methocarbamol (ROBAXIN) 500 MG tablet TAKE 1 TABLET BY MOUTH EVERY 6 HOURS AS NEEDED FOR MUSCLE SPASM 30  tablet 2  . metoprolol succinate (TOPROL-XL) 50 MG 24 hr tablet Take 1 tablet (50 mg total) by mouth 2 (two) times daily. 60 tablet 0  . Multiple Vitamin (MULTIVITAMIN WITH MINERALS) TABS tablet Take 1 tablet by mouth daily.    [name] omeprazole (PRILOSEC) 20 MG capsule Take 20 mg by mouth daily before breakfast.     . polyethylene glycol (MIRALAX / GLYCOLAX) packet Take 17 g by mouth 2 (two) times daily. 14 each 0  . sertraline (ZOLOFT) 100 MG tablet Take 1 tablet (100 mg total) by mouth daily. 30 tablet 6   No current facility-administered medications for this visit.     Allergies  Allergen Reactions  . Compazine [Prochlorperazine] Shortness Of Breath and Swelling    TONGUE SWELLS  . Shellfish-Derived Products Anaphylaxis  . Iodinated Diagnostic Agents Hives and Rash  . Omnipaque [Iohexol] Hives    REVIEW OF SYSTEMS (negative unless checked):   Cardiac:  '[]'  Chest pain or chest pressure? '[]'  Shortness of breath upon activity? '[]'  Shortness of breath when lying flat? '[]'  Irregular heart rhythm?  Vascular:  '[]'  Pain in calf, thigh, or hip brought on by walking? '[]'  Pain in feet at night that wakes you up from your sleep? '[]'  Blood clot in your veins? '[]'  Leg swelling?  Pulmonary:  '[]'  Oxygen at home? '[]'  Productive cough? '[]'  Wheezing?  Neurologic:  '[]'  Sudden weakness in arms or legs? '[]'  Sudden numbness in arms or legs? '[]'  Sudden onset of difficult speaking or slurred speech? '[]'  Temporary loss of vision in one eye? '[]'  Problems with dizziness?  Gastrointestinal:  '[]'  Blood in stool? '[]'  Vomited blood?  Genitourinary:  '[]'  Burning when urinating? '[]'  Blood in urine?  Psychiatric:  '[]'  Major depression  Hematologic:  '[]'  Bleeding problems? '[]'  Problems with blood clotting?  Dermatologic:  '[]'  Rashes or ulcers?  Constitutional:  '[]'  Fever or  chills?  Ear/Nose/Throat:  '[]'  Change in hearing? '[]'  Nose bleeds? '[]'  Sore throat?  Musculoskeletal:  '[]'  Back pain? '[]'  Joint pain? '[]'   Muscle pain?   Physical Examination     Vitals:   10/14/17 0907  BP: (!) 179/79  Pulse: 62  Resp: 18  Temp: (!) 97.4 F (36.3 C)  TempSrc: Oral  SpO2: 99%  Weight: 148 lb (67.1 kg)  Height: '5\' 7"'  (1.702 m)   Body mass index is 23.18 kg/m.  General Alert, O x 3, WD, NAD  Head [place]/AT,    Ear/Nose/ Throat Hearing grossly intact, nares without erythema or drainage, oropharynx without Erythema or Exudate, Mallampati score: 3,   Eyes PERRLA, EOMI,    Neck Supple, mid-line trachea,    Pulmonary Sym exp, good B air movt, CTA B  Cardiac RRR, Nl S1, S2, no Murmurs, No rubs, No S3,S4  Vascular Vessel Right Left  Radial Palpable Palpable  Brachial Palpable Palpable  Carotid Palpable, No Bruit Palpable, No Bruit  Aorta Not palpable N/A  Femoral Palpable Palpable  Popliteal Not palpable Not palpable  PT Not palpable Not palpable  DP Not palpable Not palpable    Gastro- intestinal soft, non-distended, non-tender to palpation, No guarding or rebound, no HSM, no masses, no CVAT B, No palpable prominent aortic pulse,    Musculo- skeletal M/S 5/5 throughout  , Extremities without ischemic changes except R AKA  , No edema present, No visible varicosities , No Lipodermatosclerosis present, R hand eschar noted  Neurologic Cranial nerves 2-12 intact , Pain and light touch intact in extremities , Motor exam as listed above  Psychiatric Judgement intact, Mood & affect appropriate for pt's clinical situation  Dermatologic See M/S exam for extremity exam, No rashes otherwise noted  Lymphatic  Palpable lymph nodes: None     Non-invasive Vascular Imaging   BUE Vein Mapping  (Date: 10/14/2017):   R arm: acceptable vein conduits include R upper arm basilic vein  L arm: acceptable vein conduits include none  BUE Doppler (Date: 10/14/2017):   R arm:   Brachial: bi, 5.5 mm  Radial: tri, 1.9 mm  Ulnar: tri, 1.5 mm  L arm:   Brachial: tri, 4.8 mm  Radial: not visualized  Ulnar: not  visualized   Outside Studies/Documentation   10 pages of outside documents were reviewed including: nephrology chart.   Medical Decision Making   Brandy Houston is a 62 y.o. female who presents with chronic kidney disease stage IV   Based on vein mapping and examination, this patient's permanent access options include: staged R BVT.  I would first proceed with right staged basilic vein transposition .  I had an extensive discussion with this patient in regards to the nature of access surgery, including risk, benefits, and alternatives.    The patient is aware that the risks of access surgery include but are not limited to: bleeding, infection, steal syndrome, nerve damage, ischemic monomelic neuropathy, failure of access to mature, complications related to venous hypertension, and possible need for additional access procedures in the future. I discussed with the patient the nature of the staged access procedure, specifically the need for a second operation to transpose the first stage fistula if it matures adequately.    The patient has agreed to proceed with the above procedure which will be scheduled 13 MAY 19.   Adele Barthel, MD, FACS [facility] Office: [phone] Pager: [phone]  10/14/2017, 8:50 AM

## 2017-10-14 NOTE — Progress Notes (Signed)
Requested by:  Jamal Maes, MD [address] [place], [place] [address]  Reason for consultation: New access   History of Present Illness   Brandy Houston is a 62 y.o. ([birth year]) female who presents for evaluation for permanent access.  The patient is right hand dominant.  The patient has had previous access procedures: L FA AVG.  Previous central venous cannulation procedures include: RIJV TDC and RIJV TLC.  The patient has never had a PPM placed.    Past Medical History:  Diagnosis Date  . Anemia   . Anxiety   . Arthritis    "in my joints" (03/10/2017)  . Chronic diastolic CHF (congestive heart failure) ([facility])   . CKD (chronic kidney disease), stage IV ([facility])    stage IV. previous HD, none currently 10/30/15 (confirmed 02/05/2017 & 03/10/2017)  . Depression    Chronic  . Diabetic peripheral neuropathy ([facility])   . Dysrhythmia    tachycardia, normal ECHO 08-09-14  . GERD (gastroesophageal reflux disease)   . Heart murmur     dx'd 02/05/2017  . Hepatitis C    "tx'd in 2016; I'm negative now" (02/05/2017)  . History of blood transfusion 2017   "w/knee replacement"  . Hypertension   . Osteoarthritis of left knee   . Peripheral neuropathy   . Protein calorie malnutrition ([facility])   . Septic arthritis ([facility])   . Slow transit constipation   . Type II diabetes mellitus ([name])    IDDM  . Uncontrolled hypertension 02/18/2013  . Unsteady gait     Past Surgical History:  Procedure Laterality Date  . AMPUTATION Left 03/20/2017   Procedure: LEFT ABOVE KNEE AMPUTATION;  Surgeon: Newt Minion, MD;  Location: [facility];  Service: Orthopedics;  Laterality: Left;  . APPENDECTOMY    . AV FISTULA PLACEMENT Left 09/13/2014   Procedure: Brachial Artery to Brachial Vein Gortex Four - Seven Stretch GRAFT INSERTION Left Forearm;  Surgeon: Mal Misty, MD;  Location: [facility];  Service: Vascular;  Laterality: Left;  . CHOLECYSTECTOMY OPEN    . COLON SURGERY    . EXCISIONAL TOTAL KNEE ARTHROPLASTY WITH  ANTIBIOTIC SPACERS Left 02/06/2017   Procedure: Incisional total iknee with antibiotic spacer ;  Surgeon: Leandrew Koyanagi, MD;  Location: [facility];  Service: Orthopedics;  Laterality: Left;  . IR FLUORO GUIDE CV LINE RIGHT  02/10/2017  . IR REMOVAL TUN CV CATH W/O FL  04/01/2017  . IR US GUIDE VASC ACCESS RIGHT  02/10/2017  . IRRIGATION AND DEBRIDEMENT KNEE Left 03/12/2017   Procedure: IRRIGATION AND DEBRIDEMENT LEFT KNEE WITH WOUND VAC APPLICATION;  Surgeon: Leandrew Koyanagi, MD;  Location: [facility];  Service: Orthopedics;  Laterality: Left;  . JOINT REPLACEMENT    . KNEE ARTHROSCOPY Right 08/10/2014   Procedure: ARTHROSCOPY I & D KNEE;  Surgeon: Marianna Payment, MD;  Location: [facility];  Service: Orthopedics;  Laterality: Right;  . KNEE ARTHROSCOPY Left 08/11/2014   Procedure: ARTHROSCOPIC WASHOUT LEFT KNEE;  Surgeon: Marianna Payment, MD;  Location: [facility];  Service: Orthopedics;  Laterality: Left;  . KNEE ARTHROSCOPY Left 08/19/2014   Procedure: ARTHROSCOPIC WASHOUT LEFT KNEE;  Surgeon: Leandrew Koyanagi, MD;  Location: [facility];  Service: Orthopedics;  Laterality: Left;  . KNEE ARTHROSCOPY WITH LATERAL MENISECTOMY Left 04/04/2015   Procedure: AND PARTIAL LATERAL MENISECTOMY;  Surgeon: Leandrew Koyanagi, MD;  Location: [facility];  Service: Orthopedics;  Laterality: Left;  . KNEE ARTHROSCOPY WITH MEDIAL MENISECTOMY  Left 04/04/2015   Procedure: LEFT KNEE ARTHROSCOPY WITH PARTIAL MEDIAL MENISCECTOMY  AND SYNOVECTOMY;  Surgeon: Leandrew Koyanagi, MD;  Location: [facility];  Service: Orthopedics;  Laterality: Left;  . SHOULDER ARTHROSCOPY Bilateral 08/10/2014   Procedure: I & D BILATERAL SHOULDERS ;  Surgeon: Marianna Payment, MD;  Location: [facility];  Service: Orthopedics;  Laterality: Bilateral;  . SMALL INTESTINE SURGERY     Due to Small Bowel Obstruction; "fixed it when they did my gallbladder OR"  . TEE WITHOUT CARDIOVERSION N/A 08/14/2014   Procedure: TRANSESOPHAGEAL ECHOCARDIOGRAM (TEE);   Surgeon: Thayer Headings, MD;  Location: [facility];  Service: Cardiovascular;  Laterality: N/A;  . TENOSYNOVECTOMY Right 08/11/2014   Procedure: RIGHT WRIST IRRIGATION AND DEBRIDEMENT, TENOSYNOVECTOMY;  Surgeon: Marianna Payment, MD;  Location: [facility];  Service: Orthopedics;  Laterality: Right;  . TOTAL KNEE ARTHROPLASTY Left 11/07/2015   Procedure: LEFT TOTAL KNEE ARTHROPLASTY WITH REVISION OF IMPLANTS;  Surgeon: Leandrew Koyanagi, MD;  Location: [facility];  Service: Orthopedics;  Laterality: Left;  . TUBAL LIGATION      Social History   Socioeconomic History  . Marital status: Single    Spouse name: Not on file  . Number of children: Not on file  . Years of education: Not on file  . Highest education level: Not on file  Occupational History  . Not on file  Social Needs  . Financial resource strain: Not on file  . Food insecurity:    Worry: Not on file    Inability: Not on file  . Transportation needs:    Medical: Not on file    Non-medical: Not on file  Tobacco Use  . Smoking status: Current Every Day Smoker    Packs/day: 0.33    Years: 35.00    Pack years: 11.55    Types: Cigarettes  . Smokeless tobacco: Never Used  Substance and Sexual Activity  . Alcohol use: No    Alcohol/week: 0.0 oz    Comment: 03/10/2017 "I drink a wine cooler a few times/year"  . Drug use: No  . Sexual activity: Never  Lifestyle  . Physical activity:    Days per week: Not on file    Minutes per session: Not on file  . Stress: Not on file  Relationships  . Social connections:    Talks on phone: Not on file    Gets together: Not on file    Attends religious service: Not on file    Active member of club or organization: Not on file    Attends meetings of clubs or organizations: Not on file    Relationship status: Not on file  . Intimate partner violence:    Fear of current or ex partner: Not on file    Emotionally abused: Not on file    Physically abused: Not on file    Forced sexual activity:  Not on file  Other Topics Concern  . Not on file  Social History Narrative  . Not on file    Family History  Problem Relation Age of Onset  . Cancer Mother   . Heart disease Father   . Cancer Sister   . Hypertension Sister   . Cancer Brother   . Diabetes Brother   . Hypertension Brother     Current Outpatient Medications  Medication Sig Dispense Refill  . amLODipine (NORVASC) 10 MG tablet Take 1 tablet (10 mg total) by mouth at bedtime. 30 tablet 0  . Blood  Glucose Monitoring Suppl (ACCU-CHEK AVIVA PLUS) W/DEVICE KIT Check sugars TID for E11.65 1 kit 0  . calcitRIOL (ROCALTROL) 0.25 MCG capsule Take 1 capsule (0.25 mcg total) by mouth every Monday, Wednesday, and Friday. 90 capsule 0  . calcium carbonate (TUMS EX) 750 MG chewable tablet Chew 1 tablet by mouth 3 (three) times daily as needed for heartburn.     . cloNIDine (CATAPRES) 0.2 MG tablet Take 0.2 mg by mouth 2 (two) times daily.    . ferrous sulfate 325 (65 FE) MG tablet Take 1 tablet (325 mg total) by mouth 2 (two) times daily with a meal. 60 tablet 3  . furosemide (LASIX) 80 MG tablet Take 1 tablet (80 mg total) by mouth 2 (two) times daily. 60 tablet 0  . gabapentin (NEURONTIN) 300 MG capsule TAKE 1 CAPSULE BY MOUTH THREE TIMES DAILY 90 capsule 0  . glucose blood (ACCU-CHEK AVIVA) test strip Check sugars TID for E11.65 100 each 12  . hydrALAZINE (APRESOLINE) 25 MG tablet Take 3 tablets (75 mg total) by mouth every 6 (six) hours. 180 tablet 0  . HYDROcodone-acetaminophen (NORCO/VICODIN) 5-325 MG tablet Take 1 tablet by mouth 2 (two) times daily as needed for moderate pain. (Patient not taking: Reported on 10/07/2017) 20 tablet 0  . Insulin Glargine (LANTUS) 100 UNIT/ML Solostar Pen Inject 13 Units into the skin daily at 10 pm. 15 mL 11  . Lancet Devices (ACCU-CHEK SOFTCLIX) lancets Check sugars TID for E11.65 1 each 10  . methocarbamol (ROBAXIN) 500 MG tablet TAKE 1 TABLET BY MOUTH EVERY 6 HOURS AS NEEDED FOR MUSCLE SPASM 30  tablet 2  . metoprolol succinate (TOPROL-XL) 50 MG 24 hr tablet Take 1 tablet (50 mg total) by mouth 2 (two) times daily. 60 tablet 0  . Multiple Vitamin (MULTIVITAMIN WITH MINERALS) TABS tablet Take 1 tablet by mouth daily.    [name] omeprazole (PRILOSEC) 20 MG capsule Take 20 mg by mouth daily before breakfast.     . polyethylene glycol (MIRALAX / GLYCOLAX) packet Take 17 g by mouth 2 (two) times daily. 14 each 0  . sertraline (ZOLOFT) 100 MG tablet Take 1 tablet (100 mg total) by mouth daily. 30 tablet 6   No current facility-administered medications for this visit.     Allergies  Allergen Reactions  . Compazine [Prochlorperazine] Shortness Of Breath and Swelling    TONGUE SWELLS  . Shellfish-Derived Products Anaphylaxis  . Iodinated Diagnostic Agents Hives and Rash  . Omnipaque [Iohexol] Hives    REVIEW OF SYSTEMS (negative unless checked):   Cardiac:  '[]'  Chest pain or chest pressure? '[]'  Shortness of breath upon activity? '[]'  Shortness of breath when lying flat? '[]'  Irregular heart rhythm?  Vascular:  '[]'  Pain in calf, thigh, or hip brought on by walking? '[]'  Pain in feet at night that wakes you up from your sleep? '[]'  Blood clot in your veins? '[]'  Leg swelling?  Pulmonary:  '[]'  Oxygen at home? '[]'  Productive cough? '[]'  Wheezing?  Neurologic:  '[]'  Sudden weakness in arms or legs? '[]'  Sudden numbness in arms or legs? '[]'  Sudden onset of difficult speaking or slurred speech? '[]'  Temporary loss of vision in one eye? '[]'  Problems with dizziness?  Gastrointestinal:  '[]'  Blood in stool? '[]'  Vomited blood?  Genitourinary:  '[]'  Burning when urinating? '[]'  Blood in urine?  Psychiatric:  '[]'  Major depression  Hematologic:  '[]'  Bleeding problems? '[]'  Problems with blood clotting?  Dermatologic:  '[]'  Rashes or ulcers?  Constitutional:  '[]'  Fever or  chills?  Ear/Nose/Throat:  '[]'  Change in hearing? '[]'  Nose bleeds? '[]'  Sore throat?  Musculoskeletal:  '[]'  Back pain? '[]'  Joint pain? '[]'   Muscle pain?   Physical Examination     Vitals:   10/14/17 0907  BP: (!) 179/79  Pulse: 62  Resp: 18  Temp: (!) 97.4 F (36.3 C)  TempSrc: Oral  SpO2: 99%  Weight: 148 lb (67.1 kg)  Height: '5\' 7"'  (1.702 m)   Body mass index is 23.18 kg/m.  General Alert, O x 3, WD, NAD  [place]/AT,    Ear/Nose/ Throat Hearing grossly intact, nares without erythema or drainage, oropharynx without Erythema or Exudate, Mallampati score: 3,   Eyes PERRLA, EOMI,    Neck Supple, mid-line trachea,    Pulmonary Sym exp, good B air movt, CTA B  Cardiac RRR, Nl S1, S2, no Murmurs, No rubs, No S3,S4  Vascular Vessel Right Left  Radial Palpable Palpable  Brachial Palpable Palpable  Carotid Palpable, No Bruit Palpable, No Bruit  Aorta Not palpable N/A  Femoral Palpable Palpable  Popliteal Not palpable Not palpable  PT Not palpable Not palpable  DP Not palpable Not palpable    Gastro- intestinal soft, non-distended, non-tender to palpation, No guarding or rebound, no HSM, no masses, no CVAT B, No palpable prominent aortic pulse,    Musculo- skeletal M/S 5/5 throughout  , Extremities without ischemic changes except R AKA  , No edema present, No visible varicosities , No Lipodermatosclerosis present, R hand eschar noted  Neurologic Cranial nerves 2-12 intact , Pain and light touch intact in extremities , Motor exam as listed above  Psychiatric Judgement intact, Mood & affect appropriate for pt's clinical situation  Dermatologic See M/S exam for extremity exam, No rashes otherwise noted  Lymphatic  Palpable lymph nodes: None     Non-invasive Vascular Imaging   BUE Vein Mapping  (Date: 10/14/2017):   R arm: acceptable vein conduits include R upper arm basilic vein  L arm: acceptable vein conduits include none  BUE Doppler (Date: 10/14/2017):   R arm:   Brachial: bi, 5.5 mm  Radial: tri, 1.9 mm  Ulnar: tri, 1.5 mm  L arm:   Brachial: tri, 4.8 mm  Radial: not visualized  Ulnar: not  visualized   Outside Studies/Documentation   10 pages of outside documents were reviewed including: nephrology chart.   Medical Decision Making   PALMIRA STICKLE is a 62 y.o. female who presents with chronic kidney disease stage IV   Based on vein mapping and examination, this patient's permanent access options include: staged R BVT.  I would first proceed with right staged basilic vein transposition .  I had an extensive discussion with this patient in regards to the nature of access surgery, including risk, benefits, and alternatives.    The patient is aware that the risks of access surgery include but are not limited to: bleeding, infection, steal syndrome, nerve damage, ischemic monomelic neuropathy, failure of access to mature, complications related to venous hypertension, and possible need for additional access procedures in the future. I discussed with the patient the nature of the staged access procedure, specifically the need for a second operation to transpose the first stage fistula if it matures adequately.    The patient has agreed to proceed with the above procedure which will be scheduled 13 MAY 19.   Adele Barthel, MD, FACS [facility] Office: [phone] Pager: [phone]  10/14/2017, 8:50 AM

## 2017-10-15 ENCOUNTER — Ambulatory Visit (HOSPITAL_COMMUNITY)
Admission: RE | Admit: 2017-10-15 | Discharge: 2017-10-15 | Disposition: A | Discharge status: Home / Self Care | Payer: Medicare Other | Source: Ambulatory Visit | Attending: Nephrology | Admitting: Nephrology

## 2017-10-15 VITALS — BP 170/62 | HR 66 | Temp 98.6°F | Resp 18

## 2017-10-15 DIAGNOSIS — D631 Anemia in chronic kidney disease: Secondary | ICD-10-CM | POA: Insufficient documentation

## 2017-10-15 DIAGNOSIS — N184 Chronic kidney disease, stage 4 (severe): Secondary | ICD-10-CM

## 2017-10-15 LAB — IRON AND TIBC
Iron: 58 ug/dL (ref 28–170)
Saturation Ratios: 20 % (ref 10.4–31.8)
TIBC: 290 ug/dL (ref 250–450)
UIBC: 232 ug/dL

## 2017-10-15 LAB — POCT HEMOGLOBIN-HEMACUE: Hemoglobin: 10.8 g/dL — ABNORMAL LOW (ref 12.0–15.0)

## 2017-10-15 LAB — FERRITIN: Ferritin: 199 ng/mL (ref 11–307)

## 2017-10-15 MED ORDER — DARBEPOETIN ALFA 150 MCG/0.3ML IJ SOSY
PREFILLED_SYRINGE | INTRAMUSCULAR | Status: AC
  Filled 2017-10-15: qty 0.3

## 2017-10-15 MED ORDER — DARBEPOETIN ALFA 150 MCG/0.3ML IJ SOSY
150.0000 ug | PREFILLED_SYRINGE | INTRAMUSCULAR | Status: DC
  Administered 2017-10-15: 150 ug via SUBCUTANEOUS

## 2017-10-16 ENCOUNTER — Encounter

## 2017-10-20 ENCOUNTER — Ambulatory Visit: Payer: Medicare Other | Attending: Orthopedic Surgery | Admitting: Physical Therapy

## 2017-10-20 ENCOUNTER — Encounter: Payer: Self-pay | Admitting: Physical Therapy

## 2017-10-20 DIAGNOSIS — R2681 Unsteadiness on feet: Secondary | ICD-10-CM | POA: Diagnosis not present

## 2017-10-20 DIAGNOSIS — R2689 Other abnormalities of gait and mobility: Secondary | ICD-10-CM | POA: Diagnosis not present

## 2017-10-20 DIAGNOSIS — M6281 Muscle weakness (generalized): Secondary | ICD-10-CM | POA: Insufficient documentation

## 2017-10-20 NOTE — Therapy (Signed)
[facility] [address] [place], [place], [address] Phone: [phone]   Fax:  [phone]  Physical Therapy Treatment  Patient Details  Name: Brandy Houston MRN: [id number] Date of Birth: [birth year] Referring Provider: Meridee Score, MD   Encounter Date: 10/20/2017  PT End of Session - 10/20/17 1439    Visit Number  3    Number of Visits  25    Date for PT Re-Evaluation  01/05/18    Authorization Type  Medicare & Medicaid    PT Start Time  1021    PT Stop Time  1102    PT Time Calculation (min)  41 min    Equipment Utilized During Treatment  Gait belt    Activity Tolerance  Patient tolerated treatment well    Behavior During Therapy  [facility] for tasks assessed/performed       Past Medical History:  Diagnosis Date  . Anemia   . Anxiety   . Arthritis    "in my joints" (03/10/2017)  . Chronic diastolic CHF (congestive heart failure) ([place])   . CKD (chronic kidney disease), stage IV ([place])    stage IV. previous HD, none currently 10/30/15 (confirmed 02/05/2017 & 03/10/2017)  . Depression    Chronic  . Diabetic peripheral neuropathy ([place])   . Dysrhythmia    tachycardia, normal ECHO 08-09-14  . GERD (gastroesophageal reflux disease)   . Heart murmur     dx'd 02/05/2017  . Hepatitis C    "tx'd in 2016; I'm negative now" (02/05/2017)  . History of blood transfusion 2017   "w/knee replacement"  . Hypertension   . Osteoarthritis of left knee   . Peripheral neuropathy   . Protein calorie malnutrition ([place])   . Septic arthritis ([place])   . Slow transit constipation   . Type II diabetes mellitus ([name])    IDDM  . Uncontrolled hypertension 02/18/2013  . Unsteady gait     Past Surgical History:  Procedure Laterality Date  . AMPUTATION Left 03/20/2017   Procedure: LEFT ABOVE KNEE AMPUTATION;  Surgeon: Newt Minion, MD;  Location: [facility];  Service: Orthopedics;  Laterality: Left;  . APPENDECTOMY    . AV FISTULA PLACEMENT Left  09/13/2014   Procedure: Brachial Artery to Brachial Vein Gortex Four - Seven Stretch GRAFT INSERTION Left Forearm;  Surgeon: Mal Misty, MD;  Location: [facility];  Service: Vascular;  Laterality: Left;  . CHOLECYSTECTOMY OPEN    . COLON SURGERY    . EXCISIONAL TOTAL KNEE ARTHROPLASTY WITH ANTIBIOTIC SPACERS Left 02/06/2017   Procedure: Incisional total iknee with antibiotic spacer ;  Surgeon: Leandrew Koyanagi, MD;  Location: [facility];  Service: Orthopedics;  Laterality: Left;  . IR FLUORO GUIDE CV LINE RIGHT  02/10/2017  . IR REMOVAL TUN CV CATH W/O FL  04/01/2017  . IR US GUIDE VASC ACCESS RIGHT  02/10/2017  . IRRIGATION AND DEBRIDEMENT KNEE Left 03/12/2017   Procedure: IRRIGATION AND DEBRIDEMENT LEFT KNEE WITH WOUND VAC APPLICATION;  Surgeon: Leandrew Koyanagi, MD;  Location: [facility];  Service: Orthopedics;  Laterality: Left;  . JOINT REPLACEMENT    . KNEE ARTHROSCOPY Right 08/10/2014   Procedure: ARTHROSCOPY I & D KNEE;  Surgeon: Marianna Payment, MD;  Location: [facility];  Service: Orthopedics;  Laterality: Right;  . KNEE ARTHROSCOPY Left 08/11/2014   Procedure: ARTHROSCOPIC WASHOUT LEFT KNEE;  Surgeon: Marianna Payment, MD;  Location: [facility];  Service: Orthopedics;  Laterality: Left;  . KNEE  ARTHROSCOPY Left 08/19/2014   Procedure: ARTHROSCOPIC WASHOUT LEFT KNEE;  Surgeon: Leandrew Koyanagi, MD;  Location: [facility];  Service: Orthopedics;  Laterality: Left;  . KNEE ARTHROSCOPY WITH LATERAL MENISECTOMY Left 04/04/2015   Procedure: AND PARTIAL LATERAL MENISECTOMY;  Surgeon: Leandrew Koyanagi, MD;  Location: [facility];  Service: Orthopedics;  Laterality: Left;  . KNEE ARTHROSCOPY WITH MEDIAL MENISECTOMY Left 04/04/2015   Procedure: LEFT KNEE ARTHROSCOPY WITH PARTIAL MEDIAL MENISCECTOMY  AND SYNOVECTOMY;  Surgeon: Leandrew Koyanagi, MD;  Location: [facility];  Service: Orthopedics;  Laterality: Left;  . SHOULDER ARTHROSCOPY Bilateral 08/10/2014   Procedure: I & D BILATERAL SHOULDERS ;  Surgeon:  Marianna Payment, MD;  Location: [facility];  Service: Orthopedics;  Laterality: Bilateral;  . SMALL INTESTINE SURGERY     Due to Small Bowel Obstruction; "fixed it when they did my gallbladder OR"  . TEE WITHOUT CARDIOVERSION N/A 08/14/2014   Procedure: TRANSESOPHAGEAL ECHOCARDIOGRAM (TEE);  Surgeon: Thayer Headings, MD;  Location: [facility];  Service: Cardiovascular;  Laterality: N/A;  . TENOSYNOVECTOMY Right 08/11/2014   Procedure: RIGHT WRIST IRRIGATION AND DEBRIDEMENT, TENOSYNOVECTOMY;  Surgeon: Marianna Payment, MD;  Location: [facility];  Service: Orthopedics;  Laterality: Right;  . TOTAL KNEE ARTHROPLASTY Left 11/07/2015   Procedure: LEFT TOTAL KNEE ARTHROPLASTY WITH REVISION OF IMPLANTS;  Surgeon: Leandrew Koyanagi, MD;  Location: [facility];  Service: Orthopedics;  Laterality: Left;  . TUBAL LIGATION      There were no vitals filed for this visit.  Subjective Assessment - 10/20/17 1024    Subjective  No new complaints.     Pertinent History  left TFA, DM, neuropathy, Hep C, HTN, CHF, depression, CKD st 5 not dialysis, arthritis, heart murmur,     Limitations  Standing;Walking;House hold activities    Patient Stated Goals  To walk with prosthesis in home & community, travel plane, car & cruise in September    Currently in Pain?  No/denies    Pain Score  0-No pain           [facility] Adult PT Treatment/Exercise - 10/20/17 1025      Transfers   Transfers  Sit to Stand;Stand to Sit    Sit to Stand  5: Supervision;With upper extremity assist;With armrests;From chair/3-in-1    Sit to Stand Details  Verbal cues for sequencing;Verbal cues for technique;Verbal cues for precautions/safety;Verbal cues for safe use of DME/AE    Sit to Stand Details (indicate cue type and reason)  pt able to stand and engage prosthetic knee without assistance    Stand to Sit  5: Supervision;With upper extremity assist;With armrests;To chair/3-in-1    Stand to Sit Details (indicate cue type and reason)  Verbal cues for  precautions/safety;Verbal cues for safe use of DME/AE    Stand to Sit Details  cues to reach back x1 and for unlocking x1. pt able to self unlock prosthetic knee to sit down.      Ambulation/Gait   Ambulation/Gait  Yes    Ambulation/Gait Assistance  4: Min assist;3: Mod assist    Ambulation/Gait Assistance Details  cues to slow down, for correct sequencing and for prosthetic knee use, pt mostly "stiff kneed" prosthesis with use of RW with gait.     Ambulation Distance (Feet)  20 Feet x 2    Assistive device  Rolling walker;Prosthesis;Parallel bars    Gait Pattern  Step-to pattern;Decreased step length - right;Decreased stance time - left;Decreased stride length;Decreased hip/knee flexion -  left;Decreased weight shift to left;Left hip hike;Left circumduction;Left flexed knee in stance;Antalgic;Lateral hip instability;Trunk flexed;Abducted - left;Poor foot clearance - left    Ambulation Surface  Level;Indoor    Pre-Gait Activities  in parallel bars x4 laps of 10 feet with bil UE support working on sequencing and prosthetic knee flexion with swing phase, heel strike and locking prosthetic knee into stance. min assist with cues needed.      Prosthetics   Prosthetic Care Comments   added 3 ply sock for improved fit as pt reported socket digging into her. provided education to pt on how to don socks and when socks are needed.     Current prosthetic wear tolerance (days/week)   most of last week and yesterday, did not wear over the weekend due to traveling to daughter's home    Current prosthetic wear tolerance (#hours/day)   2 hours 2x day     Residual limb condition   intact per pt report.    Education Provided  Residual limb care;Proper Donning;Proper Doffing;Correct ply sock adjustment;Proper wear schedule/adjustment;Proper weight-bearing schedule/adjustment    Person(s) Educated  Patient    Education Method  Explanation;Demonstration;Verbal cues    Education Method  Verbalized  understanding;Tactile cues required    Donning Prosthesis  Supervision    Doffing Prosthesis  Supervision          PT Short Term Goals - 10/06/17 1643      PT SHORT TERM GOAL #1   Title  Patient correctly donnes prosthesis including tightening suspension strap standing with RW or counter modified independent. (All STGs Target Date: 11/06/2017)    Time  1    Period  Months    Status  New    Target Date  11/06/17      PT SHORT TERM GOAL #2   Title  Patient tolerates prosthesis wear >8hrs total /day without skin issues.     Time  1    Period  Months    Status  New    Target Date  11/06/17      PT SHORT TERM GOAL #3   Title  Patient standing balance with RW & prosthesis reaches 10" anteriorly, to floor and manages clothes safely modified independent.     Time  1    Period  Months    Status  New    Target Date  11/06/17      PT SHORT TERM GOAL #4   Title  Patient ambulates 100' with RW & prosthesis with supervision.     Time  1    Period  Months    Status  New    Target Date  11/06/17      PT SHORT TERM GOAL #5   Title  Patient negotiates stairs with 2 rails, ramp & curb with RW & prosthesis with minA.     Time  1    Period  Months    Status  New    Target Date  11/06/17        PT Long Term Goals - 10/07/17 1437      PT LONG TERM GOAL #1   Title  Patient verbalizes & demonstrates proper prosthetic care to enable safe use of prosthesis. (All LTGs Target Date: 01/01/2018)    Time  12    Period  Weeks    Status  New    Target Date  01/01/18      PT LONG TERM GOAL #2   Title  Patient tolerates prosthesis wear >  80% of awake hours without skin issues or limb pain to enable function throughout her day.     Time  12    Period  Weeks    Status  New    Target Date  01/01/18      PT LONG TERM GOAL #3   Title  Patient ambulates 500' outdoors including grass with LRAD & prosthesis modified independent for community mobility.     Time  12    Period  Weeks    Status   New    Target Date  01/01/18      PT LONG TERM GOAL #4   Title  Patient negotiates ramps, curbs & stairs with LRAD & prosthesis modified independent to enable community mobility.     Time  12    Period  Weeks    Status  New    Target Date  01/01/18      PT LONG TERM GOAL #5   Title  Berg Balance Test >/= 30/56    Time  12    Period  Weeks    Status  New    Target Date  01/01/18            Plan - 10/20/17 1439    Clinical Impression Statement  Today's skilled session continued to address prosthetic education with cues/education on use of socks for improved fit. Remainder of session addressed gait with emphasis on use of prosthetic knee with gait. Pt is making steady progress toward goals and should benefit from continued PT to progress toward unmet goals.     Rehab Potential  Good    Clinical Impairments Affecting Rehab Potential  transfemoral level amputation with multiple medical issues, significantly limited activity level for >2 years    PT Frequency  2x / week    PT Duration  12 weeks    PT Treatment/Interventions  ADLs/Self Care Home Management;Moist Heat;DME Instruction;Gait training;Stair training;Functional mobility training;Therapeutic activities;Therapeutic exercise;Balance training;Neuromuscular re-education;Patient/family education;Prosthetic Training;Manual techniques;Vestibular    PT Next Visit Plan  prosthetic care education, prosthetic gait with RW with emphasis on prosthetic knee flexion/extension     Consulted and Agree with Plan of Care  Patient       Patient will benefit from skilled therapeutic intervention in order to improve the following deficits and impairments:  Abnormal gait, Decreased activity tolerance, Decreased balance, Decreased endurance, Decreased knowledge of use of DME, Decreased mobility, Decreased strength, Decreased range of motion, Impaired flexibility, Prosthetic Dependency, Postural dysfunction  Visit Diagnosis: Other abnormalities of  gait and mobility  Muscle weakness (generalized)  Unsteadiness on feet     Problem List Patient Active Problem List   Diagnosis Date Noted  . Benign essential HTN   . Stage 5 chronic kidney disease not on chronic dialysis ([place])   . Above knee amputation status, left ([place])   . DM type 2 causing CKD stage 5 ([place]) 03/21/2017  . Anemia of chronic disease 02/05/2017  . Dysplasia of cervix, high grade CIN 2 01/12/2017  . Chronic diastolic CHF (congestive heart failure) ([place]) 11/08/2015  . Total knee replacement status   . Diabetic peripheral neuropathy ([place])   . Major depression, recurrent, chronic ([place])   . Chronic kidney disease (CKD), stage IV (severe) ([place])   . Hepatitis C 08/16/2014  . Peripheral neuropathy 06/24/2013  . Compulsive tobacco user syndrome 06/24/2013    Willow Ora, PTA, [facility] [address] [place], [place] [address] [phone] 10/20/17, 2:47 PM  Name: Brandy Houston MRN: [id number] Date of Birth: Nov 11, 1955

## 2017-10-21 ENCOUNTER — Ambulatory Visit: Payer: No Typology Code available for payment source | Admitting: Physical Therapy

## 2017-10-22 ENCOUNTER — Encounter: Payer: Self-pay | Admitting: Physical Therapy

## 2017-10-22 ENCOUNTER — Ambulatory Visit: Payer: Medicare Other | Admitting: Physical Therapy

## 2017-10-22 DIAGNOSIS — R2681 Unsteadiness on feet: Secondary | ICD-10-CM | POA: Diagnosis not present

## 2017-10-22 DIAGNOSIS — R2689 Other abnormalities of gait and mobility: Secondary | ICD-10-CM

## 2017-10-22 DIAGNOSIS — M6281 Muscle weakness (generalized): Secondary | ICD-10-CM | POA: Diagnosis not present

## 2017-10-23 ENCOUNTER — Encounter (HOSPITAL_COMMUNITY): Payer: Self-pay | Admitting: *Deleted

## 2017-10-23 ENCOUNTER — Other Ambulatory Visit: Payer: Self-pay

## 2017-10-23 NOTE — Therapy (Signed)
[name] [address] [place], [place], [address] Phone: [phone]   Fax:  [phone]  Physical Therapy Treatment  Patient Details  Name: Brandy Houston MRN: [id number] Date of Birth: 12-Apr-1956 Referring Provider: Meridee Score, MD   Encounter Date: 10/22/2017  PT End of Session - 10/22/17 1528    Visit Number  4    Number of Visits  25    Date for PT Re-Evaluation  01/05/18    Authorization Type  Medicare & Medicaid    PT Start Time  1101    PT Stop Time  1147    PT Time Calculation (min)  46 min    Equipment Utilized During Treatment  Gait belt    Activity Tolerance  Patient tolerated treatment well    Behavior During Therapy  [facility] for tasks assessed/performed       Past Medical History:  Diagnosis Date  . Anemia   . Anxiety   . Arthritis    "in my joints" (03/10/2017)  . Chronic diastolic CHF (congestive heart failure) ([place])   . CKD (chronic kidney disease), stage IV ([place])    stage IV. previous HD, none currently 10/30/15 (confirmed 02/05/2017 & 03/10/2017)  . Depression    Chronic  . Diabetic peripheral neuropathy ([place])   . Dysrhythmia    tachycardia, normal ECHO 08-09-14  . GERD (gastroesophageal reflux disease)   . Heart murmur     dx'd 02/05/2017  . Hepatitis C    "tx'd in 2016; I'm negative now" (02/05/2017)  . History of blood transfusion 2017   "w/knee replacement"  . Hypertension   . Osteoarthritis of left knee   . Peripheral neuropathy   . Protein calorie malnutrition ([place])   . Septic arthritis ([place])   . Slow transit constipation   . Type II diabetes mellitus ([name])    IDDM  . Uncontrolled hypertension 02/18/2013  . Unsteady gait     Past Surgical History:  Procedure Laterality Date  . AMPUTATION Left 03/20/2017   Procedure: LEFT ABOVE KNEE AMPUTATION;  Surgeon: Newt Minion, MD;  Location: [facility];  Service: Orthopedics;  Laterality: Left;  . APPENDECTOMY    . AV FISTULA PLACEMENT Left  09/13/2014   Procedure: Brachial Artery to Brachial Vein Gortex Four - Seven Stretch GRAFT INSERTION Left Forearm;  Surgeon: Mal Misty, MD;  Location: [facility];  Service: Vascular;  Laterality: Left;  . CHOLECYSTECTOMY OPEN    . COLON SURGERY    . EXCISIONAL TOTAL KNEE ARTHROPLASTY WITH ANTIBIOTIC SPACERS Left 02/06/2017   Procedure: Incisional total iknee with antibiotic spacer ;  Surgeon: Leandrew Koyanagi, MD;  Location: [facility];  Service: Orthopedics;  Laterality: Left;  . IR FLUORO GUIDE CV LINE RIGHT  02/10/2017  . IR REMOVAL TUN CV CATH W/O FL  04/01/2017  . IR US GUIDE VASC ACCESS RIGHT  02/10/2017  . IRRIGATION AND DEBRIDEMENT KNEE Left 03/12/2017   Procedure: IRRIGATION AND DEBRIDEMENT LEFT KNEE WITH WOUND VAC APPLICATION;  Surgeon: Leandrew Koyanagi, MD;  Location: [facility];  Service: Orthopedics;  Laterality: Left;  . JOINT REPLACEMENT    . KNEE ARTHROSCOPY Right 08/10/2014   Procedure: ARTHROSCOPY I & D KNEE;  Surgeon: Marianna Payment, MD;  Location: [facility];  Service: Orthopedics;  Laterality: Right;  . KNEE ARTHROSCOPY Left 08/11/2014   Procedure: ARTHROSCOPIC WASHOUT LEFT KNEE;  Surgeon: Marianna Payment, MD;  Location: [facility];  Service: Orthopedics;  Laterality: Left;  . KNEE  ARTHROSCOPY Left 08/19/2014   Procedure: ARTHROSCOPIC WASHOUT LEFT KNEE;  Surgeon: Leandrew Koyanagi, MD;  Location: [place];  Service: Orthopedics;  Laterality: Left;  . KNEE ARTHROSCOPY WITH LATERAL MENISECTOMY Left 04/04/2015   Procedure: AND PARTIAL LATERAL MENISECTOMY;  Surgeon: Leandrew Koyanagi, MD;  Location: [facility];  Service: Orthopedics;  Laterality: Left;  . KNEE ARTHROSCOPY WITH MEDIAL MENISECTOMY Left 04/04/2015   Procedure: LEFT KNEE ARTHROSCOPY WITH PARTIAL MEDIAL MENISCECTOMY  AND SYNOVECTOMY;  Surgeon: Leandrew Koyanagi, MD;  Location: [place] [facility];  Service: Orthopedics;  Laterality: Left;  . SHOULDER ARTHROSCOPY Bilateral 08/10/2014   Procedure: I & D BILATERAL SHOULDERS ;  Surgeon:  Marianna Payment, MD;  Location: [facility];  Service: Orthopedics;  Laterality: Bilateral;  . SMALL INTESTINE SURGERY     Due to Small Bowel Obstruction; "fixed it when they did my gallbladder OR"  . TEE WITHOUT CARDIOVERSION N/A 08/14/2014   Procedure: TRANSESOPHAGEAL ECHOCARDIOGRAM (TEE);  Surgeon: Thayer Headings, MD;  Location: [facility];  Service: Cardiovascular;  Laterality: N/A;  . TENOSYNOVECTOMY Right 08/11/2014   Procedure: RIGHT WRIST IRRIGATION AND DEBRIDEMENT, TENOSYNOVECTOMY;  Surgeon: Marianna Payment, MD;  Location: [facility];  Service: Orthopedics;  Laterality: Right;  . TOTAL KNEE ARTHROPLASTY Left 11/07/2015   Procedure: LEFT TOTAL KNEE ARTHROPLASTY WITH REVISION OF IMPLANTS;  Surgeon: Leandrew Koyanagi, MD;  Location: [facility];  Service: Orthopedics;  Laterality: Left;  . TUBAL LIGATION      There were no vitals filed for this visit.  Subjective Assessment - 10/22/17 1107    Subjective  No falls. She is wearing prosthesis.     Pertinent History  left TFA, DM, neuropathy, Hep C, HTN, CHF, depression, CKD st 5 not dialysis, arthritis, heart murmur,     Limitations  Standing;Walking;House hold activities    Patient Stated Goals  To walk with prosthesis in home & community, travel plane, car & cruise in September    Currently in Pain?  No/denies                       [facility] Adult PT Treatment/Exercise - 10/22/17 1100      Transfers   Transfers  Sit to Stand;Stand to Sit    Sit to Stand  5: Supervision;With upper extremity assist;With armrests;From chair/3-in-1 with RW for support    Sit to Stand Details  Verbal cues for sequencing;Verbal cues for technique;Verbal cues for precautions/safety;Verbal cues for safe use of DME/AE    Stand to Sit  5: Supervision;With upper extremity assist;With armrests;To chair/3-in-1 RW support    Stand to Sit Details (indicate cue type and reason)  Verbal cues for precautions/safety;Verbal cues for safe use of DME/AE       Ambulation/Gait   Ambulation/Gait  Yes    Ambulation/Gait Assistance  4: Min assist    Ambulation/Gait Assistance Details  Verbal & tacitle cues for sequence, upright posture, initial contact with heel to stabilize prosthetic knee, weight shift over prosthesis and knee flexion terminal stance for swing.     Ambulation Distance (Feet)  60 Feet 60' x 2    Assistive device  Rolling walker;Prosthesis;Parallel bars    Gait Pattern  Step-to pattern;Decreased step length - right;Decreased stance time - left;Decreased stride length;Decreased hip/knee flexion - left;Decreased weight shift to left;Left hip hike;Left circumduction;Left flexed knee in stance;Antalgic;Lateral hip instability;Trunk flexed;Abducted - left;Poor foot clearance - left    Ambulation Surface  Indoor;Level    Stairs  Yes    Stairs Assistance  4: Min assist    Stairs Assistance Details (indicate cue type and reason)  PT demo & verbal cues sequence, weight shift & prosthesis control    Stair Management Technique  Two rails;Step to pattern;Forwards    Number of Stairs  4    Curb  4: Min assist RW & TFA prosthesis    Curb Details (indicate cue type and reason)  PT verbal & demo cues on sequence, weight shift & prosthesis control      Prosthetics   Prosthetic Care Comments   PT reviewed proper donnint & tightening suspension strap.  PT instructed in use of antiperspirant to help control sweat.     Current prosthetic wear tolerance (days/week)   daily    Current prosthetic wear tolerance (#hours/day)   2 hours 2x day, PT instructed to increase to 3 hrs 2x/day    Residual limb condition   intact per pt report.    Education Provided  Residual limb care;Proper Donning;Correct ply sock adjustment;Proper wear schedule/adjustment;Proper weight-bearing schedule/adjustment    Person(s) Educated  Patient    Education Method  Explanation;Demonstration;Tactile cues;Verbal cues    Education Method  Verbalized understanding;Tactile cues  required;Verbal cues required;Needs further instruction    Donning Prosthesis  Supervision    Doffing Prosthesis  Supervision               PT Short Term Goals - 10/22/17 1446      PT SHORT TERM GOAL #1   Title  Patient correctly donnes prosthesis including tightening suspension strap standing with RW or counter modified independent. (All STGs Target Date: 11/06/2017)    Time  1    Period  Months    Status  On-going    Target Date  11/06/17      PT SHORT TERM GOAL #2   Title  Patient tolerates prosthesis wear >8hrs total /day without skin issues.     Time  1    Period  Months    Status  On-going    Target Date  11/06/17      PT SHORT TERM GOAL #3   Title  Patient standing balance with RW & prosthesis reaches 10" anteriorly, to floor and manages clothes safely modified independent.     Time  1    Period  Months    Status  On-going    Target Date  11/06/17      PT SHORT TERM GOAL #4   Title  Patient ambulates 100' with RW & prosthesis with supervision.     Time  1    Period  Months    Status  On-going    Target Date  11/06/17      PT SHORT TERM GOAL #5   Title  Patient negotiates stairs with 2 rails, ramp & curb with RW & prosthesis with minA.     Time  1    Period  Months    Status  On-going    Target Date  11/06/17        PT Long Term Goals - 10/22/17 1447      PT LONG TERM GOAL #1   Title  Patient verbalizes & demonstrates proper prosthetic care to enable safe use of prosthesis. (All LTGs Target Date: 01/01/2018)    Time  12    Period  Weeks    Status  On-going    Target Date  01/01/18      PT LONG TERM GOAL #2  Title  Patient tolerates prosthesis wear >80% of awake hours without skin issues or limb pain to enable function throughout her day.     Time  12    Period  Weeks    Status  On-going    Target Date  01/01/18      PT LONG TERM GOAL #3   Title  Patient ambulates 500' outdoors including grass with LRAD & prosthesis modified independent for  community mobility.     Time  12    Period  Weeks    Status  On-going    Target Date  01/01/18      PT LONG TERM GOAL #4   Title  Patient negotiates ramps, curbs & stairs with LRAD & prosthesis modified independent to enable community mobility.     Time  12    Period  Weeks    Status  On-going    Target Date  01/01/18      PT LONG TERM GOAL #5   Title  Berg Balance Test >/= 30/56    Time  12    Period  Weeks    Status  On-going    Target Date  01/01/18            Plan - 10/22/17 1448    Clinical Impression Statement  Patient improved gait with RW & prosthesis but requires instructions & assistance for safety. PT instructed in negotiating stairs & curbs with RW & prosthesis. Patient able to perform with skilled assistance & instructions.     Rehab Potential  Good    Clinical Impairments Affecting Rehab Potential  transfemoral level amputation with multiple medical issues, significantly limited activity level for >2 years    PT Frequency  2x / week    PT Duration  12 weeks    PT Treatment/Interventions  ADLs/Self Care Home Management;Moist Heat;DME Instruction;Gait training;Stair training;Functional mobility training;Therapeutic activities;Therapeutic exercise;Balance training;Neuromuscular re-education;Patient/family education;Prosthetic Training;Manual techniques;Vestibular    PT Next Visit Plan  prosthetic care education, prosthetic gait with RW with emphasis on prosthetic knee flexion/extension, review curbs & stairs, instruct in ramps with prosthesis.    Consulted and Agree with Plan of Care  Patient       Patient will benefit from skilled therapeutic intervention in order to improve the following deficits and impairments:  Abnormal gait, Decreased activity tolerance, Decreased balance, Decreased endurance, Decreased knowledge of use of DME, Decreased mobility, Decreased strength, Decreased range of motion, Impaired flexibility, Prosthetic Dependency, Postural  dysfunction  Visit Diagnosis: Other abnormalities of gait and mobility  Muscle weakness (generalized)  Unsteadiness on feet     Problem List Patient Active Problem List   Diagnosis Date Noted  . Benign essential HTN   . Stage 5 chronic kidney disease not on chronic dialysis ([place])   . Above knee amputation status, left ([place])   . DM type 2 causing CKD stage 5 ([place]) 03/21/2017  . Anemia of chronic disease 02/05/2017  . Dysplasia of cervix, high grade CIN 2 01/12/2017  . Chronic diastolic CHF (congestive heart failure) ([place]) 11/08/2015  . Total knee replacement status   . Diabetic peripheral neuropathy ([place])   . Major depression, recurrent, chronic ([place])   . Chronic kidney disease (CKD), stage IV (severe) ([place])   . Hepatitis C 08/16/2014  . Peripheral neuropathy 06/24/2013  . Compulsive tobacco user syndrome 06/24/2013    Jamey Reas PT, DPT 10/23/2017, 2:52 PM  [facility] [address] [place], [place], [address] Phone: [phone]  Fax:  [phone]  Name: Brandy Houston MRN: [id number] Date of Birth: 1955-10-05

## 2017-10-23 NOTE — Progress Notes (Signed)
Spoke with pt for pre-op call. Pt denies cardiac history except for a heart murmur which she states she's never had any problems. Pt is a type 2 diabetic. Last A1C was 5.2 on 08/25/17. Pt states that her fasting blood sugar is usually between 98-130. Instructed pt to take 1/2 of her regular dose of Lantus Monday AM (will take 7 units). Instructed pt to check her blood sugar Monday AM when she gets up and every 2 hours until she leaves for the hospital. If blood sugar is 70 or below, treat with 1/2 cup of clear juice (apple or cranberry) and recheck blood sugar 15 minutes after drinking juice. If blood sugar continues to be 70 or below, call the Short Stay department and ask to speak to a nurse. Pt voiced understanding.

## 2017-10-26 ENCOUNTER — Encounter (HOSPITAL_COMMUNITY): Payer: Self-pay | Admitting: Surgery

## 2017-10-26 ENCOUNTER — Ambulatory Visit (HOSPITAL_COMMUNITY): Payer: Medicare Other | Admitting: Certified Registered"

## 2017-10-26 ENCOUNTER — Encounter (HOSPITAL_COMMUNITY)
Admission: RE | Disposition: A | Discharge status: Home / Self Care | Payer: Self-pay | Source: Ambulatory Visit | Attending: Vascular Surgery

## 2017-10-26 ENCOUNTER — Telehealth: Payer: Self-pay | Admitting: Vascular Surgery

## 2017-10-26 ENCOUNTER — Ambulatory Visit (HOSPITAL_COMMUNITY)
Admission: RE | Admit: 2017-10-26 | Discharge: 2017-10-26 | Disposition: A | Discharge status: Home / Self Care | Payer: Medicare Other | Source: Ambulatory Visit | Attending: Vascular Surgery | Admitting: Vascular Surgery

## 2017-10-26 ENCOUNTER — Other Ambulatory Visit: Payer: Self-pay

## 2017-10-26 DIAGNOSIS — I130 Hypertensive heart and chronic kidney disease with heart failure and stage 1 through stage 4 chronic kidney disease, or unspecified chronic kidney disease: Secondary | ICD-10-CM | POA: Diagnosis present

## 2017-10-26 DIAGNOSIS — F17210 Nicotine dependence, cigarettes, uncomplicated: Secondary | ICD-10-CM | POA: Insufficient documentation

## 2017-10-26 DIAGNOSIS — Z794 Long term (current) use of insulin: Secondary | ICD-10-CM | POA: Insufficient documentation

## 2017-10-26 DIAGNOSIS — I5032 Chronic diastolic (congestive) heart failure: Secondary | ICD-10-CM | POA: Insufficient documentation

## 2017-10-26 DIAGNOSIS — K219 Gastro-esophageal reflux disease without esophagitis: Secondary | ICD-10-CM | POA: Insufficient documentation

## 2017-10-26 DIAGNOSIS — F419 Anxiety disorder, unspecified: Secondary | ICD-10-CM | POA: Insufficient documentation

## 2017-10-26 DIAGNOSIS — N185 Chronic kidney disease, stage 5: Secondary | ICD-10-CM | POA: Diagnosis not present

## 2017-10-26 DIAGNOSIS — Z79899 Other long term (current) drug therapy: Secondary | ICD-10-CM | POA: Insufficient documentation

## 2017-10-26 DIAGNOSIS — E1142 Type 2 diabetes mellitus with diabetic polyneuropathy: Secondary | ICD-10-CM | POA: Diagnosis not present

## 2017-10-26 DIAGNOSIS — N184 Chronic kidney disease, stage 4 (severe): Secondary | ICD-10-CM | POA: Insufficient documentation

## 2017-10-26 DIAGNOSIS — F329 Major depressive disorder, single episode, unspecified: Secondary | ICD-10-CM | POA: Insufficient documentation

## 2017-10-26 DIAGNOSIS — E1122 Type 2 diabetes mellitus with diabetic chronic kidney disease: Secondary | ICD-10-CM | POA: Insufficient documentation

## 2017-10-26 HISTORY — PX: BASCILIC VEIN TRANSPOSITION: SHX5742

## 2017-10-26 LAB — GLUCOSE, CAPILLARY
Glucose-Capillary: 115 mg/dL — ABNORMAL HIGH (ref 65–99)
Glucose-Capillary: 118 mg/dL — ABNORMAL HIGH (ref 65–99)

## 2017-10-26 LAB — POCT I-STAT 4, (NA,K, GLUC, HGB,HCT)
Glucose, Bld: 108 mg/dL — ABNORMAL HIGH (ref 65–99)
HCT: 34 % — ABNORMAL LOW (ref 36.0–46.0)
Hemoglobin: 11.6 g/dL — ABNORMAL LOW (ref 12.0–15.0)
Potassium: 3 mmol/L — ABNORMAL LOW (ref 3.5–5.1)
Sodium: 142 mmol/L (ref 135–145)

## 2017-10-26 SURGERY — TRANSPOSITION, VEIN, BASILIC
Anesthesia: General | Site: Arm Lower | Laterality: Right

## 2017-10-26 MED ORDER — OXYCODONE HCL 5 MG PO TABS: 5.0000 mg | ORAL_TABLET | ORAL | 0 refills | Status: DC | PRN

## 2017-10-26 MED ORDER — PROPOFOL 10 MG/ML IV BOLUS
INTRAVENOUS | Status: DC | PRN
  Administered 2017-10-26: 100 mg via INTRAVENOUS

## 2017-10-26 MED ORDER — LIDOCAINE HCL (PF) 1 % IJ SOLN
INTRAMUSCULAR | Status: DC | PRN
  Administered 2017-10-26: 2 mL

## 2017-10-26 MED ORDER — FENTANYL CITRATE (PF) 250 MCG/5ML IJ SOLN
INTRAMUSCULAR | Status: AC
  Filled 2017-10-26: qty 5

## 2017-10-26 MED ORDER — MIDAZOLAM HCL 5 MG/5ML IJ SOLN
INTRAMUSCULAR | Status: DC | PRN
  Administered 2017-10-26: 2 mg via INTRAVENOUS

## 2017-10-26 MED ORDER — LIDOCAINE HCL (CARDIAC) PF 100 MG/5ML IV SOSY
PREFILLED_SYRINGE | INTRAVENOUS | Status: DC | PRN
  Administered 2017-10-26: 80 mg via INTRATRACHEAL

## 2017-10-26 MED ORDER — CHLORHEXIDINE GLUCONATE 4 % EX LIQD: 60.0000 mL | Freq: Once | CUTANEOUS | Status: DC

## 2017-10-26 MED ORDER — PROPOFOL 10 MG/ML IV BOLUS
INTRAVENOUS | Status: AC
  Filled 2017-10-26: qty 20

## 2017-10-26 MED ORDER — CEFAZOLIN SODIUM-DEXTROSE 2-4 GM/100ML-% IV SOLN
2.0000 g | INTRAVENOUS | Status: AC
  Administered 2017-10-26: 2 g via INTRAVENOUS

## 2017-10-26 MED ORDER — PROPOFOL 500 MG/50ML IV EMUL
INTRAVENOUS | Status: DC | PRN
  Administered 2017-10-26: 50 ug/kg/min via INTRAVENOUS

## 2017-10-26 MED ORDER — ONDANSETRON HCL 4 MG/2ML IJ SOLN
INTRAMUSCULAR | Status: DC | PRN
  Administered 2017-10-26: 4 mg via INTRAVENOUS

## 2017-10-26 MED ORDER — HYDROMORPHONE HCL 2 MG/ML IJ SOLN: 0.2500 mg | INTRAMUSCULAR | Status: DC | PRN

## 2017-10-26 MED ORDER — CEFAZOLIN SODIUM-DEXTROSE 2-4 GM/100ML-% IV SOLN
INTRAVENOUS | Status: AC
  Filled 2017-10-26: qty 100

## 2017-10-26 MED ORDER — NALOXONE HCL 0.4 MG/ML IJ SOLN
INTRAMUSCULAR | Status: DC | PRN
  Administered 2017-10-26 (×3): 40 ug via INTRAVENOUS

## 2017-10-26 MED ORDER — MIDAZOLAM HCL 2 MG/2ML IJ SOLN
INTRAMUSCULAR | Status: AC
  Filled 2017-10-26: qty 2

## 2017-10-26 MED ORDER — POTASSIUM CHLORIDE 10 MEQ/100ML IV SOLN
10.0000 meq | INTRAVENOUS | Status: AC
  Administered 2017-10-26 (×2): 10 meq via INTRAVENOUS
  Filled 2017-10-26 (×2): qty 100

## 2017-10-26 MED ORDER — SODIUM CHLORIDE 0.9 % IV SOLN
INTRAVENOUS | Status: DC
  Administered 2017-10-26 (×2): via INTRAVENOUS

## 2017-10-26 MED ORDER — 0.9 % SODIUM CHLORIDE (POUR BTL) OPTIME
TOPICAL | Status: DC | PRN
  Administered 2017-10-26: 1000 mL

## 2017-10-26 MED ORDER — LIDOCAINE HCL (PF) 1 % IJ SOLN
INTRAMUSCULAR | Status: AC
  Filled 2017-10-26: qty 30

## 2017-10-26 MED ORDER — EPHEDRINE SULFATE 50 MG/ML IJ SOLN
INTRAMUSCULAR | Status: DC | PRN
  Administered 2017-10-26: 15 mg via INTRAVENOUS
  Administered 2017-10-26: 10 mg via INTRAVENOUS

## 2017-10-26 MED ORDER — SODIUM CHLORIDE 0.9 % IV SOLN
INTRAVENOUS | Status: AC
  Filled 2017-10-26: qty 1.2

## 2017-10-26 MED ORDER — SODIUM CHLORIDE 0.9 % IV SOLN
INTRAVENOUS | Status: DC | PRN
  Administered 2017-10-26: 10:00:00

## 2017-10-26 MED ORDER — FENTANYL CITRATE (PF) 100 MCG/2ML IJ SOLN
INTRAMUSCULAR | Status: DC | PRN
  Administered 2017-10-26 (×3): 50 ug via INTRAVENOUS

## 2017-10-26 SURGICAL SUPPLY — 40 items
ARMBAND PINK RESTRICT EXTREMIT (MISCELLANEOUS) ×3 IMPLANT
CANISTER SUCT 3000ML PPV (MISCELLANEOUS) ×3 IMPLANT
CLIP VESOCCLUDE MED 24/CT (CLIP) ×3 IMPLANT
CLIP VESOCCLUDE SM WIDE 24/CT (CLIP) ×3 IMPLANT
CORDS BIPOLAR (ELECTRODE) IMPLANT
COVER PROBE W GEL 5X96 (DRAPES) ×3 IMPLANT
DECANTER SPIKE VIAL GLASS SM (MISCELLANEOUS) ×3 IMPLANT
DERMABOND ADHESIVE PROPEN (GAUZE/BANDAGES/DRESSINGS) ×2
DERMABOND ADVANCED (GAUZE/BANDAGES/DRESSINGS) ×2
DERMABOND ADVANCED .7 DNX12 (GAUZE/BANDAGES/DRESSINGS) ×1 IMPLANT
DERMABOND ADVANCED .7 DNX6 (GAUZE/BANDAGES/DRESSINGS) ×1 IMPLANT
ELECT REM PT RETURN 9FT ADLT (ELECTROSURGICAL) ×3
ELECTRODE REM PT RTRN 9FT ADLT (ELECTROSURGICAL) ×1 IMPLANT
GLOVE BIO SURGEON STRL SZ 6.5 (GLOVE) ×2 IMPLANT
GLOVE BIO SURGEON STRL SZ7 (GLOVE) ×3 IMPLANT
GLOVE BIO SURGEONS STRL SZ 6.5 (GLOVE) ×1
GLOVE BIOGEL PI IND STRL 6.5 (GLOVE) ×1 IMPLANT
GLOVE BIOGEL PI IND STRL 7.5 (GLOVE) ×1 IMPLANT
GLOVE BIOGEL PI INDICATOR 6.5 (GLOVE) ×2
GLOVE BIOGEL PI INDICATOR 7.5 (GLOVE) ×2
GOWN STRL REUS W/ TWL LRG LVL3 (GOWN DISPOSABLE) ×3 IMPLANT
GOWN STRL REUS W/TWL LRG LVL3 (GOWN DISPOSABLE) ×6
HEMOSTAT SPONGE AVITENE ULTRA (HEMOSTASIS) IMPLANT
KIT BASIN OR (CUSTOM PROCEDURE TRAY) ×3 IMPLANT
KIT TURNOVER KIT B (KITS) ×3 IMPLANT
NEEDLE HYPO 25GX1X1/2 BEV (NEEDLE) ×3 IMPLANT
NS IRRIG 1000ML POUR BTL (IV SOLUTION) ×3 IMPLANT
PACK CV ACCESS (CUSTOM PROCEDURE TRAY) ×3 IMPLANT
PAD ARMBOARD 7.5X6 YLW CONV (MISCELLANEOUS) ×6 IMPLANT
SUT MNCRL AB 4-0 PS2 18 (SUTURE) ×3 IMPLANT
SUT PROLENE 6 0 BV (SUTURE) ×3 IMPLANT
SUT PROLENE 7 0 BV 1 (SUTURE) ×3 IMPLANT
SUT SILK 2 0 SH (SUTURE) IMPLANT
SUT VIC AB 2-0 CT1 27 (SUTURE) ×2
SUT VIC AB 2-0 CT1 TAPERPNT 27 (SUTURE) ×1 IMPLANT
SUT VIC AB 3-0 SH 27 (SUTURE) ×2
SUT VIC AB 3-0 SH 27X BRD (SUTURE) ×1 IMPLANT
TOWEL GREEN STERILE (TOWEL DISPOSABLE) ×3 IMPLANT
UNDERPAD 30X30 (UNDERPADS AND DIAPERS) ×3 IMPLANT
WATER STERILE IRR 1000ML POUR (IV SOLUTION) ×3 IMPLANT

## 2017-10-26 NOTE — Telephone Encounter (Signed)
sch appt 12/09/17 1pm f/u PA spoke to pt about appt

## 2017-10-26 NOTE — Interval H&P Note (Signed)
   History and Physical Update  The patient was interviewed and re-examined.  [name] previous History and Physical has been reviewed and is unchanged from my consult.  There is no change in the plan of care: staged R BVT.   Risk, benefits, and alternatives to access surgery were discussed.    The patient is aware the risks include but are not limited to: bleeding, infection, steal syndrome, nerve damage, ischemic monomelic neuropathy, thrombosis, failure to mature, complications related to venous hypertension, need for additional procedures, death and stroke.    The patient agrees to proceed forward with the procedure.   Adele Barthel, MD, FACS [facility] Office: [phone] Pager: [phone]  10/26/2017, 7:43 AM

## 2017-10-26 NOTE — Telephone Encounter (Signed)
-----   Message from Penni Homans, RN sent at 10/26/2017 11:43 AM EDT ----- Regarding: Appointment   ----- Message ----- From: Conrad Williamsfield, MD Sent: 10/26/2017  10:46 AM To: [facility] [address]  Brandy Houston [id number] 09/09/1955  PROCEDURE: 1. right first stage basilic vein transposition (brachiobasilic arteriovenous fistula) placement   Follow-up: 6 weeks

## 2017-10-26 NOTE — Op Note (Signed)
OPERATIVE NOTE   PROCEDURE: 1. right first stage basilic vein transposition (brachiobasilic arteriovenous fistula) placement  PRE-OPERATIVE DIAGNOSIS: chronic kidney disease stage IV-V   POST-OPERATIVE DIAGNOSIS: same as above   SURGEON: Adele Barthel, MD  ASSISTANT(S): RNFA  ANESTHESIA: general  ESTIMATED BLOOD LOSS: 50 cc  FINDING(S): 1.  Basilic vein: 3-4 mm, acceptable 2.  Brachial artery: 3-4 mm, calcified 3.  Venous outflow: faintly palpable thrill  4.  Radial flow: palpable radial pulse  SPECIMEN(S):  none  INDICATIONS:   Brandy Houston is a 62 y.o. female who presents with chronic kidney disease stage IV-V.  The patient is scheduled for right first stage basilic vein transposition.  The patient is aware the risks include but are not limited to: bleeding, infection, steal syndrome, nerve damage, ischemic monomelic neuropathy, failure to mature, and need for additional procedures.  The patient is aware of the risks of the procedure and elects to proceed forward.   DESCRIPTION: After full informed written consent was obtained from the patient, the patient was brought back to the operating room and placed supine upon the operating table.  Prior to induction, the patient received IV antibiotics.   After obtaining adequate anesthesia, the patient was then prepped and draped in the standard fashion for a right arm access procedure.  I turned my attention first to identifying the patient's basilic vein and brachial artery.    Using SonoSite guidance, the location of these vessels were marked out on the skin.   At this point, I injected local anesthetic to obtain a field block of the antecubitum.  In total, I injected about 10 mL of 1% lidocaine without epinephrine.  I made a transverse incision at the level of the antecubitum and dissected through the subcutaneous tissue and fascia to gain exposure of the brachial artery.  This was noted to be 3-4 mm in diameter externally.  This  was dissected out proximally and distally and controlled with vessel loops .  I then dissected out the basilic vein.  This was noted to be 3-4 mm in diameter externally.  The distal segment of the vein was ligated with a  2-0 silk, and the vein was transected.  The proximal segment was interrogated with serial dilators.  The vein accepted up to a 4 mm dilator without any difficulty.  I then instilled the heparinized saline into the vein and clamped it.  At this point, I reset my exposure of the brachial artery and placed the artery under tension proximally and distally.  I made an arteriotomy with a #11 blade, and then I extended the arteriotomy with a Potts scissor.  I injected heparinized saline proximal and distal to this arteriotomy.  The vein was then sewn to the artery in an end-to-side configuration with a running stitch of 7-0 Prolene.  Prior to completing this anastomosis, I allowed the vein and artery to backbleed.  There was no evidence of clot from any vessels.  I completed the anastomosis in the usual fashion and then released all vessel loops and clamps.    There was a faintly palpable thrill in the venous outflow, and there was a palpable radial pulse.  At this point, I irrigated out the surgical wound.  There was no further active bleeding.  The subcutaneous tissue was reapproximated with a running stitch of 3-0 Vicryl.  The skin was then reapproximated with a running subcuticular stitch of 4-0 Vicryl.  The skin was then cleaned, dried, and reinforced with Dermabond.  The patient tolerated this procedure well.    COMPLICATIONS: none  CONDITION: stable   Adele Barthel, MD, [facility] Office: [phone] Pager: [phone]  10/26/2017, 10:43 AM

## 2017-10-26 NOTE — Transfer of Care (Signed)
Immediate Anesthesia Transfer of Care Note  Patient: Brandy Houston  Procedure(s) Performed: BASILIC VEIN TRANSPOSITION FIRST STAGE RIGHT ARM (Right Arm Lower)  Patient Location: PACU  Anesthesia Type:General  Level of Consciousness: awake and patient cooperative  Airway & Oxygen Therapy: Patient Spontanous Breathing  Post-op Assessment: Report given to RN and Post -op Vital signs reviewed and stable  Post vital signs: Reviewed and stable  Last Vitals:  Vitals Value Taken Time  BP 165/76 10/26/2017 11:01 AM  Temp    Pulse 70 10/26/2017 11:02 AM  Resp 14 10/26/2017 11:02 AM  SpO2 97 % 10/26/2017 11:02 AM  Vitals shown include unvalidated device data.  Last Pain:  Vitals:   10/26/17 0806  TempSrc: Oral  PainSc:       Patients Stated Pain Goal: 4 (00/37/94 4461)  Complications: No apparent anesthesia complications

## 2017-10-26 NOTE — Anesthesia Procedure Notes (Signed)
Procedure Name: MAC Date/Time: 10/26/2017 9:45 AM Performed by: Lance Coon, CRNA Pre-anesthesia Checklist: Patient identified, Suction available, Emergency Drugs available, Patient being monitored and Timeout performed Patient Re-evaluated:Patient Re-evaluated prior to induction Oxygen Delivery Method: Simple face mask

## 2017-10-26 NOTE — Anesthesia Preprocedure Evaluation (Addendum)
Anesthesia Evaluation  Patient identified by MRN, date of birth, ID band Patient awake    Reviewed: Allergy & Precautions, H&P , NPO status , Patient's Chart, lab work & pertinent test results, reviewed documented beta blocker date and time   Airway Mallampati: II  TM Distance: >3 FB Neck ROM: Full    Dental no notable dental hx. (+) Edentulous Upper, Edentulous Lower, Dental Advisory Given   Pulmonary Current Smoker,    Pulmonary exam normal breath sounds clear to auscultation       Cardiovascular hypertension, Pt. on medications and Pt. on home beta blockers +CHF  + Valvular Problems/Murmurs AI  Rhythm:Regular Rate:Normal + Systolic murmurs    Neuro/Psych Anxiety Depression negative neurological ROS  negative psych ROS   GI/Hepatic Neg liver ROS, GERD  Medicated and Controlled,  Endo/Other  negative endocrine ROSdiabetes  Renal/GU ESRFRenal disease  negative genitourinary   Musculoskeletal  (+) Arthritis , Osteoarthritis,    Abdominal   Peds  Hematology negative hematology ROS (+) anemia ,   Anesthesia Other Findings   Reproductive/Obstetrics negative OB ROS                             Anesthesia Physical Anesthesia Plan  ASA: III  Anesthesia Plan: MAC   Post-op Pain Management:    Induction: Intravenous  PONV Risk Score and Plan: 3 and Ondansetron, Midazolam and Treatment may vary due to age or medical condition  Airway Management Planned: Simple Face Mask  Additional Equipment:   Intra-op Plan:   Post-operative Plan:   Informed Consent: I have reviewed the patients History and Physical, chart, labs and discussed the procedure including the risks, benefits and alternatives for the proposed anesthesia with the patient or authorized representative who has indicated his/her understanding and acceptance.   Dental advisory given  Plan Discussed with: CRNA  Anesthesia Plan  Comments:        Anesthesia Quick Evaluation

## 2017-10-26 NOTE — Anesthesia Procedure Notes (Signed)
Procedure Name: LMA Insertion Date/Time: 10/26/2017 9:50 AM Performed by: Lance Coon, CRNA Pre-anesthesia Checklist: Patient identified, Emergency Drugs available, Suction available, Patient being monitored and Timeout performed Patient Re-evaluated:Patient Re-evaluated prior to induction Oxygen Delivery Method: Circle system utilized Induction Type: IV induction LMA: LMA inserted LMA Size: 4.0 Number of attempts: 1 Placement Confirmation: positive ETCO2 and breath sounds checked- equal and bilateral Tube secured with: Tape Dental Injury: Teeth and Oropharynx as per pre-operative assessment

## 2017-10-26 NOTE — Anesthesia Postprocedure Evaluation (Signed)
Anesthesia Post Note  Patient: Brandy Houston  Procedure(s) Performed: BASILIC VEIN TRANSPOSITION FIRST STAGE RIGHT ARM (Right Arm Lower)     Patient location during evaluation: PACU Anesthesia Type: General Level of consciousness: awake and alert Pain management: pain level controlled Vital Signs Assessment: post-procedure vital signs reviewed and stable Respiratory status: spontaneous breathing, nonlabored ventilation and respiratory function stable Cardiovascular status: blood pressure returned to baseline and stable Postop Assessment: no apparent nausea or vomiting Anesthetic complications: no    Last Vitals:  Vitals:   10/26/17 1145 10/26/17 1147  BP:  (!) 150/66  Pulse: 66 62  Resp: 20 (!) 21  Temp:    SpO2: 97% 99%    Last Pain:  Vitals:   10/26/17 1130  TempSrc:   PainSc: 0-No pain                 Nima Bamburg,W. EDMOND

## 2017-10-27 ENCOUNTER — Telehealth: Payer: Self-pay | Admitting: Physical Therapy

## 2017-10-27 ENCOUNTER — Encounter (HOSPITAL_COMMUNITY): Payer: Self-pay | Admitting: Vascular Surgery

## 2017-10-27 ENCOUNTER — Ambulatory Visit: Payer: Medicare Other | Admitting: Physical Therapy

## 2017-10-27 DIAGNOSIS — M6281 Muscle weakness (generalized): Secondary | ICD-10-CM | POA: Diagnosis not present

## 2017-10-27 DIAGNOSIS — E1122 Type 2 diabetes mellitus with diabetic chronic kidney disease: Secondary | ICD-10-CM | POA: Diagnosis not present

## 2017-10-27 DIAGNOSIS — I130 Hypertensive heart and chronic kidney disease with heart failure and stage 1 through stage 4 chronic kidney disease, or unspecified chronic kidney disease: Secondary | ICD-10-CM | POA: Diagnosis not present

## 2017-10-27 DIAGNOSIS — R2689 Other abnormalities of gait and mobility: Secondary | ICD-10-CM

## 2017-10-27 DIAGNOSIS — I5032 Chronic diastolic (congestive) heart failure: Secondary | ICD-10-CM | POA: Diagnosis not present

## 2017-10-27 DIAGNOSIS — R2681 Unsteadiness on feet: Secondary | ICD-10-CM

## 2017-10-27 DIAGNOSIS — N184 Chronic kidney disease, stage 4 (severe): Secondary | ICD-10-CM | POA: Diagnosis not present

## 2017-10-27 DIAGNOSIS — Z794 Long term (current) use of insulin: Secondary | ICD-10-CM | POA: Diagnosis not present

## 2017-10-27 DIAGNOSIS — T8241XD Breakdown (mechanical) of vascular dialysis catheter, subsequent encounter: Secondary | ICD-10-CM | POA: Diagnosis not present

## 2017-10-27 NOTE — Therapy (Signed)
[facility] [address] [place], [place], [address] Phone: [phone]   Fax:  [phone]  Physical Therapy Treatment  Patient Details  Name: Brandy Houston MRN: [id number] Date of Birth: 1955/08/22 Referring Provider: Meridee Score, MD   Encounter Date: 10/27/2017  PT End of Session - 10/27/17 1741    Visit Number  5    Number of Visits  25    Date for PT Re-Evaluation  01/05/18    Authorization Type  Medicare & Medicaid    PT Start Time  0931    PT Stop Time  1015    PT Time Calculation (min)  44 min    Equipment Utilized During Treatment  Gait belt    Activity Tolerance  Patient tolerated treatment well    Behavior During Therapy  [facility] for tasks assessed/performed       Past Medical History:  Diagnosis Date  . Anemia   . Anxiety   . Arthritis    "in my joints" (03/10/2017)  . Chronic diastolic CHF (congestive heart failure) ([place])   . CKD (chronic kidney disease), stage IV ([place])    stage IV. previous HD, none currently 10/30/15 (confirmed 02/05/2017 & 03/10/2017)  . Depression    Chronic  . Diabetic peripheral neuropathy ([place])   . Dysrhythmia    tachycardia, normal ECHO 08-09-14  . GERD (gastroesophageal reflux disease)   . Heart murmur     dx'd 02/05/2017  . Hepatitis C    "tx'd in 2016; I'm negative now" (02/05/2017)  . History of blood transfusion 2017   "w/knee replacement"  . Hypertension   . Osteoarthritis of left knee   . Peripheral neuropathy   . Protein calorie malnutrition ([place])   . Septic arthritis ([place])   . Slow transit constipation   . Type II diabetes mellitus ([name])    IDDM  . Uncontrolled hypertension 02/18/2013  . Unsteady gait     Past Surgical History:  Procedure Laterality Date  . AMPUTATION Left 03/20/2017   Procedure: LEFT ABOVE KNEE AMPUTATION;  Surgeon: Newt Minion, MD;  Location: [facility];  Service: Orthopedics;  Laterality: Left;  . APPENDECTOMY    . AV FISTULA PLACEMENT Left  09/13/2014   Procedure: Brachial Artery to Brachial Vein Gortex Four - Seven Stretch GRAFT INSERTION Left Forearm;  Surgeon: Mal Misty, MD;  Location: [facility];  Service: Vascular;  Laterality: Left;  . BASCILIC VEIN TRANSPOSITION Right 10/26/2017   Procedure: BASILIC VEIN TRANSPOSITION FIRST STAGE RIGHT ARM;  Surgeon: Conrad Holcomb, MD;  Location: [facility];  Service: Vascular;  Laterality: Right;  . CHOLECYSTECTOMY OPEN    . COLON SURGERY    . EXCISIONAL TOTAL KNEE ARTHROPLASTY WITH ANTIBIOTIC SPACERS Left 02/06/2017   Procedure: Incisional total iknee with antibiotic spacer ;  Surgeon: Leandrew Koyanagi, MD;  Location: [facility];  Service: Orthopedics;  Laterality: Left;  . IR FLUORO GUIDE CV LINE RIGHT  02/10/2017  . IR REMOVAL TUN CV CATH W/O FL  04/01/2017  . IR US GUIDE VASC ACCESS RIGHT  02/10/2017  . IRRIGATION AND DEBRIDEMENT KNEE Left 03/12/2017   Procedure: IRRIGATION AND DEBRIDEMENT LEFT KNEE WITH WOUND VAC APPLICATION;  Surgeon: Leandrew Koyanagi, MD;  Location: [facility];  Service: Orthopedics;  Laterality: Left;  . JOINT REPLACEMENT    . KNEE ARTHROSCOPY Right 08/10/2014   Procedure: ARTHROSCOPY I & D KNEE;  Surgeon: Marianna Payment, MD;  Location: [facility];  Service: Orthopedics;  Laterality:  Right;  [name] KNEE ARTHROSCOPY Left 08/11/2014   Procedure: ARTHROSCOPIC WASHOUT LEFT KNEE;  Surgeon: Marianna Payment, MD;  Location: [facility];  Service: Orthopedics;  Laterality: Left;  . KNEE ARTHROSCOPY Left 08/19/2014   Procedure: ARTHROSCOPIC WASHOUT LEFT KNEE;  Surgeon: Leandrew Koyanagi, MD;  Location: [facility];  Service: Orthopedics;  Laterality: Left;  . KNEE ARTHROSCOPY WITH LATERAL MENISECTOMY Left 04/04/2015   Procedure: AND PARTIAL LATERAL MENISECTOMY;  Surgeon: Leandrew Koyanagi, MD;  Location: [facility];  Service: Orthopedics;  Laterality: Left;  . KNEE ARTHROSCOPY WITH MEDIAL MENISECTOMY Left 04/04/2015   Procedure: LEFT KNEE ARTHROSCOPY WITH PARTIAL MEDIAL MENISCECTOMY  AND SYNOVECTOMY;  Surgeon:  Leandrew Koyanagi, MD;  Location: [facility];  Service: Orthopedics;  Laterality: Left;  . SHOULDER ARTHROSCOPY Bilateral 08/10/2014   Procedure: I & D BILATERAL SHOULDERS ;  Surgeon: Marianna Payment, MD;  Location: [facility];  Service: Orthopedics;  Laterality: Bilateral;  . SMALL INTESTINE SURGERY     Due to Small Bowel Obstruction; "fixed it when they did my gallbladder OR"  . TEE WITHOUT CARDIOVERSION N/A 08/14/2014   Procedure: TRANSESOPHAGEAL ECHOCARDIOGRAM (TEE);  Surgeon: Thayer Headings, MD;  Location: [facility];  Service: Cardiovascular;  Laterality: N/A;  . TENOSYNOVECTOMY Right 08/11/2014   Procedure: RIGHT WRIST IRRIGATION AND DEBRIDEMENT, TENOSYNOVECTOMY;  Surgeon: Marianna Payment, MD;  Location: [facility];  Service: Orthopedics;  Laterality: Right;  . TOTAL KNEE ARTHROPLASTY Left 11/07/2015   Procedure: LEFT TOTAL KNEE ARTHROPLASTY WITH REVISION OF IMPLANTS;  Surgeon: Leandrew Koyanagi, MD;  Location: [facility] [place];  Service: Orthopedics;  Laterality: Left;  . TUBAL LIGATION      There were no vitals filed for this visit.  Subjective Assessment - 10/27/17 0937    Subjective  She had fistula placed in right arm yesterday.      Pertinent History  left TFA, DM, neuropathy, Hep C, HTN, CHF, depression, CKD st 5 not dialysis, arthritis, heart murmur,     Limitations  Standing;Walking;House hold activities    Patient Stated Goals  To walk with prosthesis in home & community, travel plane, car & cruise in September    Currently in Pain?  Yes    Pain Score  6     Pain Location  Arm    Pain Orientation  Right    Pain Descriptors / Indicators  Aching;Burning;Tender    Pain Type  Surgical pain    Pain Onset  Yesterday    Pain Frequency  Intermittent    Aggravating Factors   surgery to place fistula on 5/13    Pain Relieving Factors  medications                       [facility] Adult PT Treatment/Exercise - 10/27/17 0930      Transfers   Transfers  Sit to Stand;Stand to  Sit    Sit to Stand  5: Supervision;With upper extremity assist;With armrests;From chair/3-in-1 to RW    Sit to Stand Details  Verbal cues for sequencing;Verbal cues for technique;Verbal cues for precautions/safety;Verbal cues for safe use of DME/AE    Stand to Sit  5: Supervision;With upper extremity assist;With armrests;To chair/3-in-1 from RW for stability    Stand to Sit Details (indicate cue type and reason)  Verbal cues for precautions/safety;Verbal cues for safe use of DME/AE      Ambulation/Gait   Ambulation/Gait  Yes    Ambulation/Gait Assistance  4: Min guard;5:  Supervision    Ambulation/Gait Assistance Details  Verbal & tactile cues on prosthetic knee control, weight shift over prosthesis in stance, step through with sound limb and upright posture.    Ambulation Distance (Feet)  120 Feet 120' & 75' around obstacles    Assistive device  Rolling walker;Prosthesis    Gait Pattern  Step-through pattern;Decreased stance time - left;Decreased step length - right;Decreased hip/knee flexion - left;Decreased weight shift to left;Antalgic;Lateral hip instability;Trunk flexed;Abducted - left    Ambulation Surface  Indoor;Level    Stairs  Yes    Stairs Assistance  4: Min guard;5: Supervision    Stairs Assistance Details (indicate cue type and reason)  PT demo & instructed in technique with TFA prosthesis    Stair Management Technique  Two rails;Step to pattern;Forwards    Number of Stairs  4    Ramp  4: Min assist Min guard with RW & Prosthesis    Ramp Details (indicate cue type and reason)  PT demo, instructed in technique, upright posture & weight shift    Curb  4: Min assist Min guard with RW & prosthesis    Curb Details (indicate cue type and reason)  PT demo, instructed in technique with prosthesis & RW with sequence, step length & weight shift.       Prosthetics   Current prosthetic wear tolerance (days/week)   daily    Current prosthetic wear tolerance (#hours/day)   wearing 2 hrs  2x/day, PT instructed to increase to 3hrs 2x/day.     Residual limb condition   intact per pt report.    Education Provided  Skin check;Residual limb care;Proper Donning;Proper wear schedule/adjustment    Person(s) Educated  Patient    Education Method  Explanation;Verbal cues    Education Method  Verbalized understanding;Needs further instruction;Verbal cues required               PT Short Term Goals - 10/22/17 1446      PT SHORT TERM GOAL #1   Title  Patient correctly donnes prosthesis including tightening suspension strap standing with RW or counter modified independent. (All STGs Target Date: 11/06/2017)    Time  1    Period  Months    Status  On-going    Target Date  11/06/17      PT SHORT TERM GOAL #2   Title  Patient tolerates prosthesis wear >8hrs total /day without skin issues.     Time  1    Period  Months    Status  On-going    Target Date  11/06/17      PT SHORT TERM GOAL #3   Title  Patient standing balance with RW & prosthesis reaches 10" anteriorly, to floor and manages clothes safely modified independent.     Time  1    Period  Months    Status  On-going    Target Date  11/06/17      PT SHORT TERM GOAL #4   Title  Patient ambulates 100' with RW & prosthesis with supervision.     Time  1    Period  Months    Status  On-going    Target Date  11/06/17      PT SHORT TERM GOAL #5   Title  Patient negotiates stairs with 2 rails, ramp & curb with RW & prosthesis with minA.     Time  1    Period  Months    Status  On-going  Target Date  11/06/17        PT Long Term Goals - 10/22/17 1447      PT LONG TERM GOAL #1   Title  Patient verbalizes & demonstrates proper prosthetic care to enable safe use of prosthesis. (All LTGs Target Date: 01/01/2018)    Time  12    Period  Weeks    Status  On-going    Target Date  01/01/18      PT LONG TERM GOAL #2   Title  Patient tolerates prosthesis wear >80% of awake hours without skin issues or limb pain to  enable function throughout her day.     Time  12    Period  Weeks    Status  On-going    Target Date  01/01/18      PT LONG TERM GOAL #3   Title  Patient ambulates 500' outdoors including grass with LRAD & prosthesis modified independent for community mobility.     Time  12    Period  Weeks    Status  On-going    Target Date  01/01/18      PT LONG TERM GOAL #4   Title  Patient negotiates ramps, curbs & stairs with LRAD & prosthesis modified independent to enable community mobility.     Time  12    Period  Weeks    Status  On-going    Target Date  01/01/18      PT LONG TERM GOAL #5   Title  Berg Balance Test >/= 30/56    Time  12    Period  Weeks    Status  On-going    Target Date  01/01/18            Plan - 10/27/17 1749    Clinical Impression Statement  PT called Dr. Lianne Moris office and recieved clearance for gait with RW, only precaution was no lifting 5#. Patient's right dominant arm is sore from surgery to place fistula yesterday. Standing & gait with RW did not increase arm discomfort. PT used ice pack to area between standing & gait activities which seemed to help her pain. Patient improved prosthesis control in gait today.     Rehab Potential  Good    Clinical Impairments Affecting Rehab Potential  transfemoral level amputation with multiple medical issues, significantly limited activity level for >2 years    PT Frequency  2x / week    PT Duration  12 weeks    PT Treatment/Interventions  ADLs/Self Care Home Management;Moist Heat;DME Instruction;Gait training;Stair training;Functional mobility training;Therapeutic activities;Therapeutic exercise;Balance training;Neuromuscular re-education;Patient/family education;Prosthetic Training;Manual techniques;Vestibular    PT Next Visit Plan  prosthetic care education, prosthetic gait with RW with emphasis on prosthetic knee flexion/extension, ramps, curbs & stairs with prosthesis.    Consulted and Agree with Plan of Care   Patient       Patient will benefit from skilled therapeutic intervention in order to improve the following deficits and impairments:  Abnormal gait, Decreased activity tolerance, Decreased balance, Decreased endurance, Decreased knowledge of use of DME, Decreased mobility, Decreased strength, Decreased range of motion, Impaired flexibility, Prosthetic Dependency, Postural dysfunction  Visit Diagnosis: Other abnormalities of gait and mobility  Muscle weakness (generalized)  Unsteadiness on feet     Problem List Patient Active Problem List   Diagnosis Date Noted  . Benign essential HTN   . Stage 5 chronic kidney disease not on chronic dialysis ([place])   . Above knee amputation status, left ([place])   .  DM type 2 causing CKD stage 5 ([place]) 03/21/2017  . Anemia of chronic disease 02/05/2017  . Dysplasia of cervix, high grade CIN 2 01/12/2017  . Chronic diastolic CHF (congestive heart failure) ([place]) 11/08/2015  . Total knee replacement status   . Diabetic peripheral neuropathy ([place])   . Major depression, recurrent, chronic ([place])   . Chronic kidney disease (CKD), stage IV (severe) ([place])   . Hepatitis C 08/16/2014  . Peripheral neuropathy 06/24/2013  . Compulsive tobacco user syndrome 06/24/2013    Jamey Reas PT, DPT 10/27/2017, 5:53 PM  [facility] [address] [place], [place], [address] Phone: [phone]   Fax:  [phone]  Name: Brandy Houston MRN: [id number] Date of Birth: September 08, 1955

## 2017-10-27 NOTE — Telephone Encounter (Signed)
Dr. Bridgett Larsson Brandy Houston is scheduled for outpatient PT today at 9:30am for prosthetic training which includes standing & gait with walker support. She had a fistula placed in right dominant arm yesterday. I wanted to make sure that she is okay to use her right arm in PT today. Are there any precautions that PT should follow today? Thank you Jamey Reas, PT, DPT [facility] [place] 10/27/2017@ 7:37 AM Phone:  [phone]  Fax:  [phone] [facility] [address] [place], [place] [address]

## 2017-10-28 ENCOUNTER — Telehealth: Payer: Self-pay | Admitting: Vascular Surgery

## 2017-10-28 DIAGNOSIS — I5032 Chronic diastolic (congestive) heart failure: Secondary | ICD-10-CM | POA: Diagnosis not present

## 2017-10-28 DIAGNOSIS — Z794 Long term (current) use of insulin: Secondary | ICD-10-CM | POA: Diagnosis not present

## 2017-10-28 DIAGNOSIS — N184 Chronic kidney disease, stage 4 (severe): Secondary | ICD-10-CM | POA: Diagnosis not present

## 2017-10-28 DIAGNOSIS — I130 Hypertensive heart and chronic kidney disease with heart failure and stage 1 through stage 4 chronic kidney disease, or unspecified chronic kidney disease: Secondary | ICD-10-CM | POA: Diagnosis not present

## 2017-10-28 DIAGNOSIS — E1122 Type 2 diabetes mellitus with diabetic chronic kidney disease: Secondary | ICD-10-CM | POA: Diagnosis not present

## 2017-10-28 DIAGNOSIS — T8241XD Breakdown (mechanical) of vascular dialysis catheter, subsequent encounter: Secondary | ICD-10-CM | POA: Diagnosis not present

## 2017-10-28 NOTE — Telephone Encounter (Signed)
    Discussed with PT restriction in regards to recent staged BRVT.  No direct stress to incision for 4 weeks.  Adele Barthel, MD, FACS [facility] Office: [phone] Pager: [phone]  10/28/2017, 8:56 AM

## 2017-10-29 ENCOUNTER — Encounter: Payer: Self-pay | Admitting: Physical Therapy

## 2017-10-29 ENCOUNTER — Ambulatory Visit: Payer: Medicare Other | Admitting: Physical Therapy

## 2017-10-29 DIAGNOSIS — R2689 Other abnormalities of gait and mobility: Secondary | ICD-10-CM | POA: Diagnosis not present

## 2017-10-29 DIAGNOSIS — M6281 Muscle weakness (generalized): Secondary | ICD-10-CM | POA: Diagnosis not present

## 2017-10-29 DIAGNOSIS — R2681 Unsteadiness on feet: Secondary | ICD-10-CM

## 2017-10-29 NOTE — Therapy (Signed)
[facility] [address] [place], [place], [address] Phone: [phone]   Fax:  [phone]  Physical Therapy Treatment  Patient Details  Name: Brandy Houston MRN: [id number] Date of Birth: Oct 30, 1955 Referring Provider: Meridee Score, MD   Encounter Date: 10/29/2017  PT End of Session - 10/29/17 2141    Visit Number  6    Number of Visits  25    Date for PT Re-Evaluation  01/05/18    Authorization Type  Medicare & Medicaid    PT Start Time  3903    PT Stop Time  1057    PT Time Calculation (min)  42 min    Equipment Utilized During Treatment  Gait belt    Activity Tolerance  Patient tolerated treatment well    Behavior During Therapy  WFL for tasks assessed/performed       Past Medical History:  Diagnosis Date  . Anemia   . Anxiety   . Arthritis    "in my joints" (03/10/2017)  . Chronic diastolic CHF (congestive heart failure) ([place])   . CKD (chronic kidney disease), stage IV ([place])    stage IV. previous HD, none currently 10/30/15 (confirmed 02/05/2017 & 03/10/2017)  . Depression    Chronic  . Diabetic peripheral neuropathy ([facility])   . Dysrhythmia    tachycardia, normal ECHO 08-09-14  . GERD (gastroesophageal reflux disease)   . Heart murmur     dx'd 02/05/2017  . Hepatitis C    "tx'd in 2016; I'm negative now" (02/05/2017)  . History of blood transfusion 2017   "w/knee replacement"  . Hypertension   . Osteoarthritis of left knee   . Peripheral neuropathy   . Protein calorie malnutrition ([place])   . Septic arthritis ([place])   . Slow transit constipation   . Type II diabetes mellitus ([name])    IDDM  . Uncontrolled hypertension 02/18/2013  . Unsteady gait     Past Surgical History:  Procedure Laterality Date  . AMPUTATION Left 03/20/2017   Procedure: LEFT ABOVE KNEE AMPUTATION;  Surgeon: Newt Minion, MD;  Location: [facility];  Service: Orthopedics;  Laterality: Left;  . APPENDECTOMY    . AV FISTULA PLACEMENT Left  09/13/2014   Procedure: Brachial Artery to Brachial Vein Gortex Four - Seven Stretch GRAFT INSERTION Left Forearm;  Surgeon: Mal Misty, MD;  Location: [facility];  Service: Vascular;  Laterality: Left;  . BASCILIC VEIN TRANSPOSITION Right 10/26/2017   Procedure: BASILIC VEIN TRANSPOSITION FIRST STAGE RIGHT ARM;  Surgeon: Conrad [place], MD;  Location: [facility];  Service: Vascular;  Laterality: Right;  . CHOLECYSTECTOMY OPEN    . COLON SURGERY    . EXCISIONAL TOTAL KNEE ARTHROPLASTY WITH ANTIBIOTIC SPACERS Left 02/06/2017   Procedure: Incisional total iknee with antibiotic spacer ;  Surgeon: Leandrew Koyanagi, MD;  Location: [facility];  Service: Orthopedics;  Laterality: Left;  . IR FLUORO GUIDE CV LINE RIGHT  02/10/2017  . IR REMOVAL TUN CV CATH W/O FL  04/01/2017  . IR US GUIDE VASC ACCESS RIGHT  02/10/2017  . IRRIGATION AND DEBRIDEMENT KNEE Left 03/12/2017   Procedure: IRRIGATION AND DEBRIDEMENT LEFT KNEE WITH WOUND VAC APPLICATION;  Surgeon: Leandrew Koyanagi, MD;  Location: [facility];  Service: Orthopedics;  Laterality: Left;  . JOINT REPLACEMENT    . KNEE ARTHROSCOPY Right 08/10/2014   Procedure: ARTHROSCOPY I & D KNEE;  Surgeon: Marianna Payment, MD;  Location: [facility];  Service: Orthopedics;  Laterality:  Right;  [name] KNEE ARTHROSCOPY Left 08/11/2014   Procedure: ARTHROSCOPIC WASHOUT LEFT KNEE;  Surgeon: Marianna Payment, MD;  Location: [facility];  Service: Orthopedics;  Laterality: Left;  . KNEE ARTHROSCOPY Left 08/19/2014   Procedure: ARTHROSCOPIC WASHOUT LEFT KNEE;  Surgeon: Leandrew Koyanagi, MD;  Location: [facility];  Service: Orthopedics;  Laterality: Left;  . KNEE ARTHROSCOPY WITH LATERAL MENISECTOMY Left 04/04/2015   Procedure: AND PARTIAL LATERAL MENISECTOMY;  Surgeon: Leandrew Koyanagi, MD;  Location: [facility];  Service: Orthopedics;  Laterality: Left;  . KNEE ARTHROSCOPY WITH MEDIAL MENISECTOMY Left 04/04/2015   Procedure: LEFT KNEE ARTHROSCOPY WITH PARTIAL MEDIAL MENISCECTOMY  AND SYNOVECTOMY;  Surgeon:  Leandrew Koyanagi, MD;  Location: [facility];  Service: Orthopedics;  Laterality: Left;  . SHOULDER ARTHROSCOPY Bilateral 08/10/2014   Procedure: I & D BILATERAL SHOULDERS ;  Surgeon: Marianna Payment, MD;  Location: [facility];  Service: Orthopedics;  Laterality: Bilateral;  . SMALL INTESTINE SURGERY     Due to Small Bowel Obstruction; "fixed it when they did my gallbladder OR"  . TEE WITHOUT CARDIOVERSION N/A 08/14/2014   Procedure: TRANSESOPHAGEAL ECHOCARDIOGRAM (TEE);  Surgeon: Thayer Headings, MD;  Location: [facility];  Service: Cardiovascular;  Laterality: N/A;  . TENOSYNOVECTOMY Right 08/11/2014   Procedure: RIGHT WRIST IRRIGATION AND DEBRIDEMENT, TENOSYNOVECTOMY;  Surgeon: Marianna Payment, MD;  Location: [facility];  Service: Orthopedics;  Laterality: Right;  . TOTAL KNEE ARTHROPLASTY Left 11/07/2015   Procedure: LEFT TOTAL KNEE ARTHROPLASTY WITH REVISION OF IMPLANTS;  Surgeon: Leandrew Koyanagi, MD;  Location: [facility];  Service: Orthopedics;  Laterality: Left;  . TUBAL LIGATION      There were no vitals filed for this visit.  Subjective Assessment - 10/29/17 1017    Subjective  She was able to wear prosthesis 3 hrs 1x/day. Her arm is doing okay but is itching.     Pertinent History  left TFA, DM, neuropathy, Hep C, HTN, CHF, depression, CKD st 5 not dialysis, arthritis, heart murmur,     Limitations  Standing;Walking;House hold activities    Patient Stated Goals  To walk with prosthesis in home & community, travel plane, car & cruise in September    Currently in Pain?  No/denies Right arm hurting 5/10 in morning but took medication so not hurting now                       [facility] Adult PT Treatment/Exercise - 10/29/17 1015      Transfers   Transfers  Sit to Stand;Stand to Sit    Sit to Stand  5: Supervision;With upper extremity assist;With armrests;From chair/3-in-1;4: Min assist to [place], [place] chairs without armrests using UEs    Sit to Stand Details  Verbal cues for  sequencing;Verbal cues for technique;Verbal cues for precautions/safety;Verbal cues for safe use of DME/AE    Sit to Stand Details (indicate cue type and reason)  PT demo, instructed in technique with chairs without armrests with TFA prosthesis    Stand to Sit  5: Supervision;With upper extremity assist;With armrests;To chair/3-in-1;4: Min guard from RW for stability, min guard chairs without armrests    Stand to Sit Details (indicate cue type and reason)  Verbal cues for precautions/safety;Verbal cues for safe use of DME/AE    Stand to Sit Details  PT demo, instructed in technique with chairs without armrests with TFA prosthesis      Ambulation/Gait   Ambulation/Gait  Yes  Ambulation/Gait Assistance  4: Min guard;5: Supervision    Ambulation/Gait Assistance Details  verbal & tactile cues on prosthetic knee control with heel contact , weight shift over prosthesis & upright posture.     Ambulation Distance (Feet)  200 Feet 100' & 200'    Assistive device  Rolling walker;Prosthesis    Gait Pattern  Step-through pattern;Decreased stance time - left;Decreased step length - right;Decreased hip/knee flexion - left;Decreased weight shift to left;Antalgic;Lateral hip instability;Trunk flexed;Abducted - left    Ambulation Surface  Indoor;Level    Stairs  Yes    Stairs Assistance  5: Supervision;4: Min guard    Stairs Assistance Details (indicate cue type and reason)  cues on sequence    Stair Management Technique  Two rails;Step to pattern;Forwards    Number of Stairs  4    Ramp  4: Min assist Min guard with RW & Prosthesis    Ramp Details (indicate cue type and reason)  verbal & tactile cues on technique with posture & weight shift.    Curb  4: Min assist Min guard with RW & prosthesis    Curb Details (indicate cue type and reason)  verbal cues on technique / sequence      High Level Balance   High Level Balance Activities  Turns    High Level Balance Comments  turning 360* clockwise &  counterclockwise with pelvis leading motions and prosthesis under pelvis in stance for weight bearing, not twisting/ pivoting on feet      Self-Care   Self-Care  Lifting    Lifting  PT demo, instructed in technique with TFA prosthesis extended / abducted with LUE support on RW. Pt return demo understanding 3 reps with min guard.       Prosthetics   Prosthetic Care Comments   fully tightening suspension strap in standing.     Current prosthetic wear tolerance (days/week)   daily    Current prosthetic wear tolerance (#hours/day)   3hrs 2x/day.     Residual limb condition   intact per pt report.    Education Provided  Skin check;Residual limb care;Proper Donning;Proper wear schedule/adjustment    Person(s) Educated  Patient    Education Method  Explanation;Verbal cues;Demonstration    Education Method  Verbalized understanding;Verbal cues required;Needs further instruction               PT Short Term Goals - 10/22/17 1446      PT SHORT TERM GOAL #1   Title  Patient correctly donnes prosthesis including tightening suspension strap standing with RW or counter modified independent. (All STGs Target Date: 11/06/2017)    Time  1    Period  Months    Status  On-going    Target Date  11/06/17      PT SHORT TERM GOAL #2   Title  Patient tolerates prosthesis wear >8hrs total /day without skin issues.     Time  1    Period  Months    Status  On-going    Target Date  11/06/17      PT SHORT TERM GOAL #3   Title  Patient standing balance with RW & prosthesis reaches 10" anteriorly, to floor and manages clothes safely modified independent.     Time  1    Period  Months    Status  On-going    Target Date  11/06/17      PT SHORT TERM GOAL #4   Title  Patient ambulates 100' with RW &  prosthesis with supervision.     Time  1    Period  Months    Status  On-going    Target Date  11/06/17      PT SHORT TERM GOAL #5   Title  Patient negotiates stairs with 2 rails, ramp & curb with RW  & prosthesis with minA.     Time  1    Period  Months    Status  On-going    Target Date  11/06/17        PT Long Term Goals - 10/22/17 1447      PT LONG TERM GOAL #1   Title  Patient verbalizes & demonstrates proper prosthetic care to enable safe use of prosthesis. (All LTGs Target Date: 01/01/2018)    Time  12    Period  Weeks    Status  On-going    Target Date  01/01/18      PT LONG TERM GOAL #2   Title  Patient tolerates prosthesis wear >80% of awake hours without skin issues or limb pain to enable function throughout her day.     Time  12    Period  Weeks    Status  On-going    Target Date  01/01/18      PT LONG TERM GOAL #3   Title  Patient ambulates 500' outdoors including grass with LRAD & prosthesis modified independent for community mobility.     Time  12    Period  Weeks    Status  On-going    Target Date  01/01/18      PT LONG TERM GOAL #4   Title  Patient negotiates ramps, curbs & stairs with LRAD & prosthesis modified independent to enable community mobility.     Time  12    Period  Weeks    Status  On-going    Target Date  01/01/18      PT LONG TERM GOAL #5   Title  Berg Balance Test >/= 30/56    Time  12    Period  Weeks    Status  On-going    Target Date  01/01/18            Plan - 10/29/17 2142    Clinical Impression Statement  Patient reports no change in RUE discomfort with standing & gait activities. Patient improved prosthetic control with straight pathes with skilled instructions. Turns are still difficult with tactile & verbal cues required.     Clinical Presentation  Stable    Rehab Potential  Good    Clinical Impairments Affecting Rehab Potential  transfemoral level amputation with multiple medical issues, significantly limited activity level for >2 years    PT Frequency  2x / week    PT Duration  12 weeks    PT Treatment/Interventions  ADLs/Self Care Home Management;Moist Heat;DME Instruction;Gait training;Stair training;Functional  mobility training;Therapeutic activities;Therapeutic exercise;Balance training;Neuromuscular re-education;Patient/family education;Prosthetic Training;Manual techniques;Vestibular    PT Next Visit Plan  check STGs & set updated STGs, prosthetic gait with RW including ramps, curbs & stairs, work on turns 90*, 180* & 360*, balance reaching & picking up objects    Consulted and Agree with Plan of Care  Patient       Patient will benefit from skilled therapeutic intervention in order to improve the following deficits and impairments:  Abnormal gait, Decreased activity tolerance, Decreased balance, Decreased endurance, Decreased knowledge of use of DME, Decreased mobility, Decreased strength, Decreased range of motion, Impaired flexibility,  Prosthetic Dependency, Postural dysfunction  Visit Diagnosis: Other abnormalities of gait and mobility  Muscle weakness (generalized)  Unsteadiness on feet     Problem List Patient Active Problem List   Diagnosis Date Noted  . Benign essential HTN   . Stage 5 chronic kidney disease not on chronic dialysis ([facility])   . Above knee amputation status, left ([place])   . DM type 2 causing CKD stage 5 ([facility]) 03/21/2017  . Anemia of chronic disease 02/05/2017  . Dysplasia of cervix, high grade CIN 2 01/12/2017  . Chronic diastolic CHF (congestive heart failure) ([place]) 11/08/2015  . Total knee replacement status   . Diabetic peripheral neuropathy ([place])   . Major depression, recurrent, chronic ([place])   . Chronic kidney disease (CKD), stage IV (severe) ([facility])   . Hepatitis C 08/16/2014  . Peripheral neuropathy 06/24/2013  . Compulsive tobacco user syndrome 06/24/2013    Jamey Reas PT, DPT 10/29/2017, 9:48 PM  [facility] [address] [place], [place], [address] Phone: [phone]   Fax:  [phone]  Name: Brandy Houston MRN: [id number] Date of Birth: 07-24-1955

## 2017-11-02 DIAGNOSIS — Z794 Long term (current) use of insulin: Secondary | ICD-10-CM | POA: Diagnosis not present

## 2017-11-02 DIAGNOSIS — N184 Chronic kidney disease, stage 4 (severe): Secondary | ICD-10-CM | POA: Diagnosis not present

## 2017-11-02 DIAGNOSIS — T8241XD Breakdown (mechanical) of vascular dialysis catheter, subsequent encounter: Secondary | ICD-10-CM | POA: Diagnosis not present

## 2017-11-02 DIAGNOSIS — I130 Hypertensive heart and chronic kidney disease with heart failure and stage 1 through stage 4 chronic kidney disease, or unspecified chronic kidney disease: Secondary | ICD-10-CM | POA: Diagnosis not present

## 2017-11-02 DIAGNOSIS — I5032 Chronic diastolic (congestive) heart failure: Secondary | ICD-10-CM | POA: Diagnosis not present

## 2017-11-02 DIAGNOSIS — E1122 Type 2 diabetes mellitus with diabetic chronic kidney disease: Secondary | ICD-10-CM | POA: Diagnosis not present

## 2017-11-03 ENCOUNTER — Other Ambulatory Visit (INDEPENDENT_AMBULATORY_CARE_PROVIDER_SITE_OTHER): Payer: Self-pay | Admitting: Orthopedic Surgery

## 2017-11-03 ENCOUNTER — Ambulatory Visit: Payer: Medicare Other | Admitting: Physical Therapy

## 2017-11-03 ENCOUNTER — Other Ambulatory Visit (INDEPENDENT_AMBULATORY_CARE_PROVIDER_SITE_OTHER): Payer: Self-pay | Admitting: Orthopaedic Surgery

## 2017-11-03 ENCOUNTER — Encounter: Payer: Self-pay | Admitting: Physical Therapy

## 2017-11-03 DIAGNOSIS — Z794 Long term (current) use of insulin: Secondary | ICD-10-CM | POA: Diagnosis not present

## 2017-11-03 DIAGNOSIS — M6281 Muscle weakness (generalized): Secondary | ICD-10-CM

## 2017-11-03 DIAGNOSIS — I130 Hypertensive heart and chronic kidney disease with heart failure and stage 1 through stage 4 chronic kidney disease, or unspecified chronic kidney disease: Secondary | ICD-10-CM | POA: Diagnosis not present

## 2017-11-03 DIAGNOSIS — R2681 Unsteadiness on feet: Secondary | ICD-10-CM

## 2017-11-03 DIAGNOSIS — E1122 Type 2 diabetes mellitus with diabetic chronic kidney disease: Secondary | ICD-10-CM | POA: Diagnosis not present

## 2017-11-03 DIAGNOSIS — T8241XD Breakdown (mechanical) of vascular dialysis catheter, subsequent encounter: Secondary | ICD-10-CM | POA: Diagnosis not present

## 2017-11-03 DIAGNOSIS — I5032 Chronic diastolic (congestive) heart failure: Secondary | ICD-10-CM | POA: Diagnosis not present

## 2017-11-03 DIAGNOSIS — R2689 Other abnormalities of gait and mobility: Secondary | ICD-10-CM | POA: Diagnosis not present

## 2017-11-03 DIAGNOSIS — N184 Chronic kidney disease, stage 4 (severe): Secondary | ICD-10-CM | POA: Diagnosis not present

## 2017-11-03 NOTE — Patient Instructions (Signed)
If wearing long pants, put pants on prosthesis first. Feed prosthesis thru left pant leg. You may have to take the shoe off.   1. Turn liner inside out. Make sure label is on top so strap is in front of leg. Fold liner along seams and place circle against bottom of your leg with fold level. Roll liner up your leg. You may need to scoot forward and turn to your right to clear your leg from w/c bottom.  2.  Feed strap thru slit in your prosthesis. Make sure strap is not twisted. Tighten strap when sitting so greater than 2 inches of rough and soft velcro overlap.  3. Stand up to a walker or counter. Wiggle your leg down into the prosthesis. Pull strap out of hole first then disconnect velcro & tighten as much as possible. 4. Pick left leg up 5-10 times putting weight on & off prosthesis. Readjust, pull strap out of hole first then disconnect velcro & tighten as much as possible. 5. Step left leg out & back 5-10 times putting weight on & off prosthesis. Readjust, pull strap out of hole first then disconnect velcro & tighten as much as possible.    6. Check strap only when standing. Good to check when finish toileting when standing before you pull your pants back up.

## 2017-11-04 NOTE — Therapy (Signed)
[facility] [address] [place], [place], [address] Phone: [phone]   Fax:  [phone]  Physical Therapy Treatment  Patient Details  Name: Brandy Houston MRN: [id number] Date of Birth: [birth year] Referring Provider: Meridee Score, MD   Encounter Date: 11/03/2017  PT End of Session - 11/03/17 1706    Visit Number  7    Number of Visits  25    Date for PT Re-Evaluation  01/05/18    Authorization Type  Medicare & Medicaid    PT Start Time  2482    PT Stop Time  1100    PT Time Calculation (min)  45 min    Equipment Utilized During Treatment  Gait belt    Activity Tolerance  Patient tolerated treatment well    Behavior During Therapy  [facility] for tasks assessed/performed       Past Medical History:  Diagnosis Date  . Anemia   . Anxiety   . Arthritis    "in my joints" (03/10/2017)  . Chronic diastolic CHF (congestive heart failure) ([place])   . CKD (chronic kidney disease), stage IV ([place])    stage IV. previous HD, none currently 10/30/15 (confirmed 02/05/2017 & 03/10/2017)  . Depression    Chronic  . Diabetic peripheral neuropathy ([place])   . Dysrhythmia    tachycardia, normal ECHO 08-09-14  . GERD (gastroesophageal reflux disease)   . Heart murmur     dx'd 02/05/2017  . Hepatitis C    "tx'd in 2016; I'm negative now" (02/05/2017)  . History of blood transfusion 2017   "w/knee replacement"  . Hypertension   . Osteoarthritis of left knee   . Peripheral neuropathy   . Protein calorie malnutrition ([place])   . Septic arthritis ([place])   . Slow transit constipation   . Type II diabetes mellitus ([name])    IDDM  . Uncontrolled hypertension 02/18/2013  . Unsteady gait     Past Surgical History:  Procedure Laterality Date  . AMPUTATION Left 03/20/2017   Procedure: LEFT ABOVE KNEE AMPUTATION;  Surgeon: Newt Minion, MD;  Location: [facility];  Service: Orthopedics;  Laterality: Left;  . APPENDECTOMY    . AV FISTULA PLACEMENT Left  09/13/2014   Procedure: Brachial Artery to Brachial Vein Gortex Four - Seven Stretch GRAFT INSERTION Left Forearm;  Surgeon: Mal Misty, MD;  Location: [facility];  Service: Vascular;  Laterality: Left;  . BASCILIC VEIN TRANSPOSITION Right 10/26/2017   Procedure: BASILIC VEIN TRANSPOSITION FIRST STAGE RIGHT ARM;  Surgeon: Conrad Gotha, MD;  Location: [facility];  Service: Vascular;  Laterality: Right;  . CHOLECYSTECTOMY OPEN    . COLON SURGERY    . EXCISIONAL TOTAL KNEE ARTHROPLASTY WITH ANTIBIOTIC SPACERS Left 02/06/2017   Procedure: Incisional total iknee with antibiotic spacer ;  Surgeon: Leandrew Koyanagi, MD;  Location: [facility];  Service: Orthopedics;  Laterality: Left;  . IR FLUORO GUIDE CV LINE RIGHT  02/10/2017  . IR REMOVAL TUN CV CATH W/O FL  04/01/2017  . IR US GUIDE VASC ACCESS RIGHT  02/10/2017  . IRRIGATION AND DEBRIDEMENT KNEE Left 03/12/2017   Procedure: IRRIGATION AND DEBRIDEMENT LEFT KNEE WITH WOUND VAC APPLICATION;  Surgeon: Leandrew Koyanagi, MD;  Location: [facility];  Service: Orthopedics;  Laterality: Left;  . JOINT REPLACEMENT    . KNEE ARTHROSCOPY Right 08/10/2014   Procedure: ARTHROSCOPY I & D KNEE;  Surgeon: Marianna Payment, MD;  Location: [facility];  Service: Orthopedics;  Laterality:  Right;  [name] KNEE ARTHROSCOPY Left 08/11/2014   Procedure: ARTHROSCOPIC WASHOUT LEFT KNEE;  Surgeon: Marianna Payment, MD;  Location: [facility];  Service: Orthopedics;  Laterality: Left;  . KNEE ARTHROSCOPY Left 08/19/2014   Procedure: ARTHROSCOPIC WASHOUT LEFT KNEE;  Surgeon: Leandrew Koyanagi, MD;  Location: [facility];  Service: Orthopedics;  Laterality: Left;  . KNEE ARTHROSCOPY WITH LATERAL MENISECTOMY Left 04/04/2015   Procedure: AND PARTIAL LATERAL MENISECTOMY;  Surgeon: Leandrew Koyanagi, MD;  Location: [facility];  Service: Orthopedics;  Laterality: Left;  . KNEE ARTHROSCOPY WITH MEDIAL MENISECTOMY Left 04/04/2015   Procedure: LEFT KNEE ARTHROSCOPY WITH PARTIAL MEDIAL MENISCECTOMY  AND SYNOVECTOMY;  Surgeon:  Leandrew Koyanagi, MD;  Location: [facility];  Service: Orthopedics;  Laterality: Left;  . SHOULDER ARTHROSCOPY Bilateral 08/10/2014   Procedure: I & D BILATERAL SHOULDERS ;  Surgeon: Marianna Payment, MD;  Location: [facility];  Service: Orthopedics;  Laterality: Bilateral;  . SMALL INTESTINE SURGERY     Due to Small Bowel Obstruction; "fixed it when they did my gallbladder OR"  . TEE WITHOUT CARDIOVERSION N/A 08/14/2014   Procedure: TRANSESOPHAGEAL ECHOCARDIOGRAM (TEE);  Surgeon: Thayer Headings, MD;  Location: [facility];  Service: Cardiovascular;  Laterality: N/A;  . TENOSYNOVECTOMY Right 08/11/2014   Procedure: RIGHT WRIST IRRIGATION AND DEBRIDEMENT, TENOSYNOVECTOMY;  Surgeon: Marianna Payment, MD;  Location: [facility];  Service: Orthopedics;  Laterality: Right;  . TOTAL KNEE ARTHROPLASTY Left 11/07/2015   Procedure: LEFT TOTAL KNEE ARTHROPLASTY WITH REVISION OF IMPLANTS;  Surgeon: Leandrew Koyanagi, MD;  Location: [facility];  Service: Orthopedics;  Laterality: Left;  . TUBAL LIGATION      There were no vitals filed for this visit.  Subjective Assessment - 11/03/17 1024    Subjective  She is wearing prosthesis 2-3 hrs 1x/day. Family is traveling to [place] 11/13/17 - 11/15/2017. She is doing extra dialysis day on 5/30 for trip so cancelled PT appt. She is not planning to take prosthesis as not enough room in [facility].     Pertinent History  left TFA, DM, neuropathy, Hep C, HTN, CHF, depression, CKD st 5 not dialysis, arthritis, heart murmur,     Limitations  Standing;Walking;House hold activities    Patient Stated Goals  To walk with prosthesis in home & community, travel plane, car & cruise in September    Currently in Pain?  No/denies                       [facility] Adult PT Treatment/Exercise - 11/03/17 1015      Transfers   Transfers  Sit to Stand;Stand to Sit    Sit to Stand  5: Supervision;With upper extremity assist;With armrests;From chair/3-in-1;4: Min guard to RW, min  guard chairs without armrests using UEs    Sit to Stand Details  Verbal cues for sequencing;Verbal cues for technique;Verbal cues for precautions/safety;Verbal cues for safe use of DME/AE    Stand to Sit  5: Supervision;With upper extremity assist;With armrests;To chair/3-in-1;4: Min guard from RW for stability, min guard chairs without armrests    Stand to Sit Details (indicate cue type and reason)  Verbal cues for precautions/safety;Verbal cues for safe use of DME/AE      Ambulation/Gait   Ambulation/Gait  Yes    Ambulation/Gait Assistance  5: Supervision    Ambulation/Gait Assistance Details  verbal cues on step length, prosthetic knee control and wt shift over prosthesis in stance  Ambulation Distance (Feet)  70 Feet 70' X 3    Assistive device  Rolling walker;Prosthesis    Gait Pattern  Step-through pattern;Decreased stance time - left;Decreased step length - right;Decreased hip/knee flexion - left;Decreased weight shift to left;Antalgic;Lateral hip instability;Trunk flexed;Abducted - left    Ambulation Surface  Indoor;Level    Stairs  Yes    Stairs Assistance  5: Supervision;4: Min guard    Stairs Assistance Details (indicate cue type and reason)  verbal cues on wt shift over prosthesis in stance    Stair Management Technique  Two rails;Step to pattern;Forwards    Number of Stairs  4    Ramp  4: Min assist Min guard with RW & Prosthesis    Ramp Details (indicate cue type and reason)  verbal cues on sequence & technique with TFA prosthesis    Curb  4: Min assist Min guard with RW & prosthesis    Curb Details (indicate cue type and reason)  verbal cues on sequence & technique with TFA prosthesis      High Level Balance   High Level Balance Activities  Turns    High Level Balance Comments  reaches 10" anteriorly & to floor with RW support with supervision with verbal cues.       Self-Care   Self-Care  Lifting    Lifting  PT demo, instructed in technique with TFA prosthesis extended  / abducted with LUE support on RW. Pt return demo understanding 3 reps with min guard.       Prosthetics   Prosthetic Care Comments   fully tightening suspension strap in standing.     Current prosthetic wear tolerance (days/week)   daily    Current prosthetic wear tolerance (#hours/day)   3-4 hrs 2x/day.     Residual limb condition   intact per pt report.    Education Provided  Skin check;Residual limb care;Proper Donning;Proper wear schedule/adjustment    Person(s) Educated  Patient    Education Method  Explanation;Verbal cues    Education Method  Verbalized understanding;Verbal cues required;Needs further instruction             PT Education - 11/03/17 1030    Education provided  Yes    Education Details  transport w/c, using prosthesis for household gait with RW    Person(s) Educated  Patient    Methods  Explanation;Verbal cues    Comprehension  Verbalized understanding;Need further instruction       PT Short Term Goals - 11/03/17 1706      PT SHORT TERM GOAL #1   Title  Patient correctly donnes prosthesis including tightening suspension strap standing with RW or counter modified independent. (All STGs Target Date: 11/06/2017)    Baseline  MET 11/03/2017    Time  1    Period  Months    Status  Achieved      PT SHORT TERM GOAL #2   Title  Patient tolerates prosthesis wear >8hrs total /day without skin issues.     Time  1    Period  Months    Status  On-going      PT SHORT TERM GOAL #3   Title  Patient standing balance with RW & prosthesis reaches 10" anteriorly, to floor and manages clothes safely modified independent.     Time  1    Period  Months    Status  On-going      PT SHORT TERM GOAL #4   Title  Patient ambulates  100' with RW & prosthesis with supervision.     Time  1    Period  Months    Status  On-going      PT SHORT TERM GOAL #5   Title  Patient negotiates stairs with 2 rails, ramp & curb with RW & prosthesis with minA.     Baseline  MET 11/03/2017     Time  1    Period  Months    Status  Achieved        PT Long Term Goals - 10/22/17 1447      PT LONG TERM GOAL #1   Title  Patient verbalizes & demonstrates proper prosthetic care to enable safe use of prosthesis. (All LTGs Target Date: 01/01/2018)    Time  12    Period  Weeks    Status  On-going    Target Date  01/01/18      PT LONG TERM GOAL #2   Title  Patient tolerates prosthesis wear >80% of awake hours without skin issues or limb pain to enable function throughout her day.     Time  12    Period  Weeks    Status  On-going    Target Date  01/01/18      PT LONG TERM GOAL #3   Title  Patient ambulates 500' outdoors including grass with LRAD & prosthesis modified independent for community mobility.     Time  12    Period  Weeks    Status  On-going    Target Date  01/01/18      PT LONG TERM GOAL #4   Title  Patient negotiates ramps, curbs & stairs with LRAD & prosthesis modified independent to enable community mobility.     Time  12    Period  Weeks    Status  On-going    Target Date  01/01/18      PT LONG TERM GOAL #5   Title  Berg Balance Test >/= 30/56    Time  12    Period  Weeks    Status  On-going    Target Date  01/01/18            Plan - 11/03/17 1707    Clinical Impression Statement  Patient met 2 of STGs checked today. She has improved prosthetic gait with RW indoor surfaces and was able to catch balance with use of RW. She still requires skilled instruction to improve prosthetic gait & control but appears safe to walk with walker short distances in home.     Rehab Potential  Good    Clinical Impairments Affecting Rehab Potential  transfemoral level amputation with multiple medical issues, significantly limited activity level for >2 years    PT Frequency  2x / week    PT Duration  12 weeks    PT Treatment/Interventions  ADLs/Self Care Home Management;Moist Heat;DME Instruction;Gait training;Stair training;Functional mobility training;Therapeutic  activities;Therapeutic exercise;Balance training;Neuromuscular re-education;Patient/family education;Prosthetic Training;Manual techniques;Vestibular    PT Next Visit Plan  check remaining STGs & set updated STGs, prosthetic gait with RW including ramps, curbs & stairs, work on turns 90*, 180* & 360*, balance reaching & picking up objects    Consulted and Agree with Plan of Care  Patient       Patient will benefit from skilled therapeutic intervention in order to improve the following deficits and impairments:  Abnormal gait, Decreased activity tolerance, Decreased balance, Decreased endurance, Decreased knowledge of use of DME, Decreased mobility, Decreased  strength, Decreased range of motion, Impaired flexibility, Prosthetic Dependency, Postural dysfunction  Visit Diagnosis: Other abnormalities of gait and mobility  Muscle weakness (generalized)  Unsteadiness on feet     Problem List Patient Active Problem List   Diagnosis Date Noted  . Benign essential HTN   . Stage 5 chronic kidney disease not on chronic dialysis ([place])   . Above knee amputation status, left ([place])   . DM type 2 causing CKD stage 5 ([place]) 03/21/2017  . Anemia of chronic disease 02/05/2017  . Dysplasia of cervix, high grade CIN 2 01/12/2017  . Chronic diastolic CHF (congestive heart failure) ([place]) 11/08/2015  . Total knee replacement status   . Diabetic peripheral neuropathy ([place])   . Major depression, recurrent, chronic ([place])   . Chronic kidney disease (CKD), stage IV (severe) ([place])   . Hepatitis C 08/16/2014  . Peripheral neuropathy 06/24/2013  . Compulsive tobacco user syndrome 06/24/2013    Jamey Reas PT, DPT 11/04/2017, 7:10 AM  [facility] [address] [place], [place], [address] Phone: [phone]   Fax:  [phone]  Name: Brandy Houston MRN: [id number] Date of Birth: [birth year]

## 2017-11-05 ENCOUNTER — Encounter: Payer: Self-pay | Admitting: Physical Therapy

## 2017-11-05 ENCOUNTER — Ambulatory Visit: Payer: Medicare Other | Admitting: Physical Therapy

## 2017-11-05 DIAGNOSIS — R2689 Other abnormalities of gait and mobility: Secondary | ICD-10-CM

## 2017-11-05 DIAGNOSIS — M6281 Muscle weakness (generalized): Secondary | ICD-10-CM | POA: Diagnosis not present

## 2017-11-05 DIAGNOSIS — R2681 Unsteadiness on feet: Secondary | ICD-10-CM | POA: Diagnosis not present

## 2017-11-05 NOTE — Therapy (Signed)
[facility] [address] [place], [place], [address] Phone: [phone]   Fax:  [phone]  Physical Therapy Treatment  Patient Details  Name: Brandy Houston MRN: [id number] Date of Birth: [birth year] Referring Provider: Meridee Score, MD   Encounter Date: 11/05/2017  PT End of Session - 11/05/17 1618    Visit Number  8    Number of Visits  25    Date for PT Re-Evaluation  01/05/18    Authorization Type  Medicare & Medicaid    PT Start Time  0930    PT Stop Time  1015    PT Time Calculation (min)  45 min    Equipment Utilized During Treatment  Gait belt    Activity Tolerance  Patient tolerated treatment well    Behavior During Therapy  [facility] for tasks assessed/performed       Past Medical History:  Diagnosis Date  . Anemia   . Anxiety   . Arthritis    "in my joints" (03/10/2017)  . Chronic diastolic CHF (congestive heart failure) ([place])   . CKD (chronic kidney disease), stage IV ([place])    stage IV. previous HD, none currently 10/30/15 (confirmed 02/05/2017 & 03/10/2017)  . Depression    Chronic  . Diabetic peripheral neuropathy ([place])   . Dysrhythmia    tachycardia, normal ECHO 08-09-14  . GERD (gastroesophageal reflux disease)   . Heart murmur     dx'd 02/05/2017  . Hepatitis C    "tx'd in 2016; I'm negative now" (02/05/2017)  . History of blood transfusion 2017   "w/knee replacement"  . Hypertension   . Osteoarthritis of left knee   . Peripheral neuropathy   . Protein calorie malnutrition ([place])   . Septic arthritis ([place])   . Slow transit constipation   . Type II diabetes mellitus ([name])    IDDM  . Uncontrolled hypertension 02/18/2013  . Unsteady gait     Past Surgical History:  Procedure Laterality Date  . AMPUTATION Left 03/20/2017   Procedure: LEFT ABOVE KNEE AMPUTATION;  Surgeon: Newt Minion, MD;  Location: [facility];  Service: Orthopedics;  Laterality: Left;  . APPENDECTOMY    . AV FISTULA PLACEMENT Left  09/13/2014   Procedure: Brachial Artery to Brachial Vein Gortex Four - Seven Stretch GRAFT INSERTION Left Forearm;  Surgeon: Mal Misty, MD;  Location: [facility];  Service: Vascular;  Laterality: Left;  . BASCILIC VEIN TRANSPOSITION Right 10/26/2017   Procedure: BASILIC VEIN TRANSPOSITION FIRST STAGE RIGHT ARM;  Surgeon: Conrad Stanley, MD;  Location: [facility];  Service: Vascular;  Laterality: Right;  . CHOLECYSTECTOMY OPEN    . COLON SURGERY    . EXCISIONAL TOTAL KNEE ARTHROPLASTY WITH ANTIBIOTIC SPACERS Left 02/06/2017   Procedure: Incisional total iknee with antibiotic spacer ;  Surgeon: Leandrew Koyanagi, MD;  Location: [facility];  Service: Orthopedics;  Laterality: Left;  . IR FLUORO GUIDE CV LINE RIGHT  02/10/2017  . IR REMOVAL TUN CV CATH W/O FL  04/01/2017  . IR US GUIDE VASC ACCESS RIGHT  02/10/2017  . IRRIGATION AND DEBRIDEMENT KNEE Left 03/12/2017   Procedure: IRRIGATION AND DEBRIDEMENT LEFT KNEE WITH WOUND VAC APPLICATION;  Surgeon: Leandrew Koyanagi, MD;  Location: [facility];  Service: Orthopedics;  Laterality: Left;  . JOINT REPLACEMENT    . KNEE ARTHROSCOPY Right 08/10/2014   Procedure: ARTHROSCOPY I & D KNEE;  Surgeon: Marianna Payment, MD;  Location: [facility];  Service: Orthopedics;  Laterality:  Right;  [name] KNEE ARTHROSCOPY Left 08/11/2014   Procedure: ARTHROSCOPIC WASHOUT LEFT KNEE;  Surgeon: Marianna Payment, MD;  Location: [facility];  Service: Orthopedics;  Laterality: Left;  . KNEE ARTHROSCOPY Left 08/19/2014   Procedure: ARTHROSCOPIC WASHOUT LEFT KNEE;  Surgeon: Leandrew Koyanagi, MD;  Location: [facility];  Service: Orthopedics;  Laterality: Left;  . KNEE ARTHROSCOPY WITH LATERAL MENISECTOMY Left 04/04/2015   Procedure: AND PARTIAL LATERAL MENISECTOMY;  Surgeon: Leandrew Koyanagi, MD;  Location: [facility];  Service: Orthopedics;  Laterality: Left;  . KNEE ARTHROSCOPY WITH MEDIAL MENISECTOMY Left 04/04/2015   Procedure: LEFT KNEE ARTHROSCOPY WITH PARTIAL MEDIAL MENISCECTOMY  AND SYNOVECTOMY;  Surgeon:  Leandrew Koyanagi, MD;  Location: [facility];  Service: Orthopedics;  Laterality: Left;  . SHOULDER ARTHROSCOPY Bilateral 08/10/2014   Procedure: I & D BILATERAL SHOULDERS ;  Surgeon: Marianna Payment, MD;  Location: [facility];  Service: Orthopedics;  Laterality: Bilateral;  . SMALL INTESTINE SURGERY     Due to Small Bowel Obstruction; "fixed it when they did my gallbladder OR"  . TEE WITHOUT CARDIOVERSION N/A 08/14/2014   Procedure: TRANSESOPHAGEAL ECHOCARDIOGRAM (TEE);  Surgeon: Thayer Headings, MD;  Location: [facility];  Service: Cardiovascular;  Laterality: N/A;  . TENOSYNOVECTOMY Right 08/11/2014   Procedure: RIGHT WRIST IRRIGATION AND DEBRIDEMENT, TENOSYNOVECTOMY;  Surgeon: Marianna Payment, MD;  Location: [facility];  Service: Orthopedics;  Laterality: Right;  . TOTAL KNEE ARTHROPLASTY Left 11/07/2015   Procedure: LEFT TOTAL KNEE ARTHROPLASTY WITH REVISION OF IMPLANTS;  Surgeon: Leandrew Koyanagi, MD;  Location: [facility];  Service: Orthopedics;  Laterality: Left;  . TUBAL LIGATION      There were no vitals filed for this visit.  Subjective Assessment - 11/05/17 0939    Subjective  She wore prosthesis 3 hrs in morning & 4 hrs in afternoon. She feel asleep with it on limb. She walked with walker & prosthesis in home one time short distance.     Pertinent History  left TFA, DM, neuropathy, Hep C, HTN, CHF, depression, CKD st 5 not dialysis, arthritis, heart murmur,     Limitations  Standing;Walking;House hold activities    Patient Stated Goals  To walk with prosthesis in home & community, travel plane, car & cruise in September    Currently in Pain?  No/denies                       [facility] Adult PT Treatment/Exercise - 11/05/17 1015      Transfers   Transfers  Sit to Stand;Stand to Sit    Sit to Stand  5: Supervision;With upper extremity assist;With armrests;From chair/3-in-1;4: Min guard to RW, min guard chairs without armrests using UEs    Sit to Stand Details  Verbal  cues for sequencing;Verbal cues for technique;Verbal cues for precautions/safety;Verbal cues for safe use of DME/AE    Stand to Sit  5: Supervision;With upper extremity assist;With armrests;To chair/3-in-1;4: Min guard from RW for stability, min guard chairs without armrests    Stand to Sit Details (indicate cue type and reason)  Verbal cues for precautions/safety;Verbal cues for safe use of DME/AE      Ambulation/Gait   Ambulation/Gait  Yes    Ambulation/Gait Assistance  5: Supervision    Ambulation Distance (Feet)  130 Feet 130' & 75'    Assistive device  Rolling walker;Prosthesis    Gait Pattern  Step-through pattern;Decreased stance time - left;Decreased step length - right;Decreased hip/knee  flexion - left;Decreased weight shift to left;Antalgic;Lateral hip instability;Trunk flexed;Abducted - left    Ambulation Surface  Indoor;Level    Stairs  Yes    Stairs Assistance  5: Supervision;4: Min guard    Stairs Assistance Details (indicate cue type and reason)  verbal cues on sequence, wt shift & prosthesis control     Stair Management Technique  Two rails;Step to pattern;Forwards    Number of Stairs  4    Ramp  4: Min assist Min guard with RW & Prosthesis    Ramp Details (indicate cue type and reason)  verbal cues on posture & wt shift    Curb  4: Min assist Min guard with RW & prosthesis    Curb Details (indicate cue type and reason)  verbal cues on sequence and prosthesis control      High Level Balance   High Level Balance Activities  Turns;Side stepping;Backward walking parallel bars & TFA prosthesis    High Level Balance Comments  PT demo, verbal & tactile cues on technique with TFA prosthesis      Self-Care   Self-Care  --    Lifting  --      Prosthetics   Prosthetic Care Comments   fully tightening suspension strap in standing.     Current prosthetic wear tolerance (days/week)   daily    Current prosthetic wear tolerance (#hours/day)   PT advised to increase wear to 4-5 hrs  2x/day.  When out of town on family trip to [place], wear liner in car up to 5 hrs 2x/day and before/after going to park.     Residual limb condition   intact per pt report.    Education Provided  Skin check;Residual limb care;Proper Donning;Proper wear schedule/adjustment    Person(s) Educated  Patient    Education Method  Explanation;Verbal cues    Education Method  Verbalized understanding;Verbal cues required;Needs further instruction              11/05/17 1619  PT SHORT TERM GOAL #1  Title Patient correctly donnes prosthesis including tightening suspension strap standing with RW or counter modified independent. (All STGs Target Date: 11/06/2017)  Baseline MET 11/03/2017  Time 1  Period Months  Status Achieved  PT SHORT TERM GOAL #2  Title Patient tolerates prosthesis wear >8hrs total /day without skin issues.   Baseline Partially MET 11/05/2017 wearing total ~7 hrs /day without skin issues.   Time 1  Period Months  Status Partially Met  PT SHORT TERM GOAL #3  Title Patient standing balance with RW & prosthesis reaches 10" anteriorly, to floor and manages clothes safely modified independent.   Baseline MET 11/05/2017  Time 1  Period Months  Status Achieved  PT SHORT TERM GOAL #4  Title Patient ambulates 100' with RW & prosthesis with supervision.   Baseline MET 11/05/2017  Time 1  Period Months  Status Achieved  PT SHORT TERM GOAL #5  Title Patient negotiates stairs with 2 rails, ramp & curb with RW & prosthesis with minA.   Baseline MET 11/03/2017  Time 1  Period Months  Status Achieved     PT Short Term Goals - 11/05/17 1628      PT SHORT TERM GOAL #1   Title  Patient verbalizes initial understanding of need to adjust ply socks with limb volume changes. (All STGs Target Date: 12/04/2017)    Time  4    Period  Weeks    Status  New    Target  Date  12/04/17      PT SHORT TERM GOAL #2   Title  Patient tolerates prosthesis wear >10hrs total /day without skin  issues.     Time  4    Period  Weeks    Status  New    Target Date  12/04/17      PT SHORT TERM GOAL #3   Title  Patient standing balance without UE support stands 2 minutes and reaches 2" anteriorly with supervision.     Time  4    Period  Weeks    Status  New    Target Date  12/04/17      PT SHORT TERM GOAL #4   Title  Patient ambulates 150' outdoors on paved surfaces with RW & prosthesis with supervision.     Time  4    Period  Weeks    Status  New    Target Date  12/04/17      PT SHORT TERM GOAL #5   Title  Patient negotiates stairs with 1 rail/cane, ramp & curb with RW & prosthesis with supervision.     Time  4    Period  Weeks    Status  New    Target Date  12/04/17        PT Long Term Goals - 10/22/17 1447      PT LONG TERM GOAL #1   Title  Patient verbalizes & demonstrates proper prosthetic care to enable safe use of prosthesis. (All LTGs Target Date: 01/01/2018)    Time  12    Period  Weeks    Status  On-going    Target Date  01/01/18      PT LONG TERM GOAL #2   Title  Patient tolerates prosthesis wear >80% of awake hours without skin issues or limb pain to enable function throughout her day.     Time  12    Period  Weeks    Status  On-going    Target Date  01/01/18      PT LONG TERM GOAL #3   Title  Patient ambulates 500' outdoors including grass with LRAD & prosthesis modified independent for community mobility.     Time  12    Period  Weeks    Status  On-going    Target Date  01/01/18      PT LONG TERM GOAL #4   Title  Patient negotiates ramps, curbs & stairs with LRAD & prosthesis modified independent to enable community mobility.     Time  12    Period  Weeks    Status  On-going    Target Date  01/01/18      PT LONG TERM GOAL #5   Title  Berg Balance Test >/= 30/56    Time  12    Period  Weeks    Status  On-going    Target Date  01/01/18            Plan - 11/05/17 1625    Clinical Impression Statement  Patient met all STGs set  for first 30 days. She is improving prosthetic gait & standing activities with RW. Her wear is progressing slower than anticipated but partially related to surgery to have new fistula place in dominant RUE.     Rehab Potential  Good    Clinical Impairments Affecting Rehab Potential  transfemoral level amputation with multiple medical issues, significantly limited activity level for >2 years    PT  Frequency  2x / week    PT Duration  12 weeks    PT Treatment/Interventions  ADLs/Self Care Home Management;Moist Heat;DME Instruction;Gait training;Stair training;Functional mobility training;Therapeutic activities;Therapeutic exercise;Balance training;Neuromuscular re-education;Patient/family education;Prosthetic Training;Manual techniques;Vestibular    PT Next Visit Plan  prosthetic gait with RW including ramps, curbs & stairs, work on turns 90*, 180* & 360*, balance reaching & picking up objects    Consulted and Agree with Plan of Care  Patient       Patient will benefit from skilled therapeutic intervention in order to improve the following deficits and impairments:  Abnormal gait, Decreased activity tolerance, Decreased balance, Decreased endurance, Decreased knowledge of use of DME, Decreased mobility, Decreased strength, Decreased range of motion, Impaired flexibility, Prosthetic Dependency, Postural dysfunction  Visit Diagnosis: Other abnormalities of gait and mobility  Muscle weakness (generalized)  Unsteadiness on feet     Problem List Patient Active Problem List   Diagnosis Date Noted  . Benign essential HTN   . 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 Above knee amputation status, left ([place])   . 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 Anemia of chronic disease 02/05/2017  . Dysplasia of cervix, high grade CIN 2 01/12/2017  . Chronic diastolic CHF (congestive heart failure) ([place]) 11/08/2015  . Total knee replacement status   . Diabetic  peripheral neuropathy ([place])   . Major depression, recurrent, chronic ([place])   . Chronic kidney disease (CKD), stage IV (severe) ([place])   . Hepatitis C 08/16/2014  . Peripheral neuropathy 06/24/2013  . Compulsive tobacco user syndrome 06/24/2013    Jamey Reas PT, DPT 11/05/2017, 4:32 PM  [facility] [address] [place], [place], [address] Phone: [phone]   Fax:  [phone]  Name: SARANN TREGRE MRN: [id number] Date of Birth: May 14, 1956

## 2017-11-11 ENCOUNTER — Encounter: Payer: Self-pay | Admitting: Physical Therapy

## 2017-11-11 ENCOUNTER — Ambulatory Visit: Payer: Medicare Other | Admitting: Physical Therapy

## 2017-11-11 DIAGNOSIS — R2689 Other abnormalities of gait and mobility: Secondary | ICD-10-CM | POA: Diagnosis not present

## 2017-11-11 DIAGNOSIS — R2681 Unsteadiness on feet: Secondary | ICD-10-CM | POA: Diagnosis not present

## 2017-11-11 DIAGNOSIS — M6281 Muscle weakness (generalized): Secondary | ICD-10-CM | POA: Diagnosis not present

## 2017-11-11 NOTE — Therapy (Signed)
[facility] [address] [place], [place], [address] Phone: [phone]   Fax:  [phone]  Physical Therapy Treatment  Patient Details  Name: Brandy Houston MRN: [id number] Date of Birth: Sep 21, 1955 Referring Provider: Meridee Score, MD   Encounter Date: 11/11/2017  PT End of Session - 11/11/17 1021    Visit Number  9    Number of Visits  25    Date for PT Re-Evaluation  01/05/18    Authorization Type  Medicare & Medicaid    PT Start Time  1019    PT Stop Time  1058    PT Time Calculation (min)  39 min    Equipment Utilized During Treatment  Gait belt    Activity Tolerance  Patient tolerated treatment well    Behavior During Therapy  WFL for tasks assessed/performed       Past Medical History:  Diagnosis Date  . Anemia   . Anxiety   . Arthritis    "in my joints" (03/10/2017)  . Chronic diastolic CHF (congestive heart failure) ([facility])   . CKD (chronic kidney disease), stage IV ([facility])    stage IV. previous HD, none currently 10/30/15 (confirmed 02/05/2017 & 03/10/2017)  . Depression    Chronic  . Diabetic peripheral neuropathy ([facility])   . Dysrhythmia    tachycardia, normal ECHO 08-09-14  . GERD (gastroesophageal reflux disease)   . Heart murmur     dx'd 02/05/2017  . Hepatitis C    "tx'd in 2016; I'm negative now" (02/05/2017)  . History of blood transfusion 2017   "w/knee replacement"  . Hypertension   . Osteoarthritis of left knee   . Peripheral neuropathy   . Protein calorie malnutrition ([facility])   . Septic arthritis ([facility])   . Slow transit constipation   . Type II diabetes mellitus ([name])    IDDM  . Uncontrolled hypertension 02/18/2013  . Unsteady gait     Past Surgical History:  Procedure Laterality Date  . AMPUTATION Left 03/20/2017   Procedure: LEFT ABOVE KNEE AMPUTATION;  Surgeon: Newt Minion, MD;  Location: [facility];  Service: Orthopedics;  Laterality: Left;  . APPENDECTOMY    . AV FISTULA PLACEMENT Left  09/13/2014   Procedure: Brachial Artery to Brachial Vein Gortex Four - Seven Stretch GRAFT INSERTION Left Forearm;  Surgeon: Mal Misty, MD;  Location: [facility];  Service: Vascular;  Laterality: Left;  . BASCILIC VEIN TRANSPOSITION Right 10/26/2017   Procedure: BASILIC VEIN TRANSPOSITION FIRST STAGE RIGHT ARM;  Surgeon: Conrad Bartholomew, MD;  Location: [facility];  Service: Vascular;  Laterality: Right;  . CHOLECYSTECTOMY OPEN    . COLON SURGERY    . EXCISIONAL TOTAL KNEE ARTHROPLASTY WITH ANTIBIOTIC SPACERS Left 02/06/2017   Procedure: Incisional total iknee with antibiotic spacer ;  Surgeon: Leandrew Koyanagi, MD;  Location: [facility];  Service: Orthopedics;  Laterality: Left;  . IR FLUORO GUIDE CV LINE RIGHT  02/10/2017  . IR REMOVAL TUN CV CATH W/O FL  04/01/2017  . IR US GUIDE VASC ACCESS RIGHT  02/10/2017  . IRRIGATION AND DEBRIDEMENT KNEE Left 03/12/2017   Procedure: IRRIGATION AND DEBRIDEMENT LEFT KNEE WITH WOUND VAC APPLICATION;  Surgeon: Leandrew Koyanagi, MD;  Location: [facility];  Service: Orthopedics;  Laterality: Left;  . JOINT REPLACEMENT    . KNEE ARTHROSCOPY Right 08/10/2014   Procedure: ARTHROSCOPY I & D KNEE;  Surgeon: Marianna Payment, MD;  Location: [facility];  Service: Orthopedics;  Laterality:  Right;  [name] KNEE ARTHROSCOPY Left 08/11/2014   Procedure: ARTHROSCOPIC WASHOUT LEFT KNEE;  Surgeon: Marianna Payment, MD;  Location: [facility];  Service: Orthopedics;  Laterality: Left;  . KNEE ARTHROSCOPY Left 08/19/2014   Procedure: ARTHROSCOPIC WASHOUT LEFT KNEE;  Surgeon: Leandrew Koyanagi, MD;  Location: [facility];  Service: Orthopedics;  Laterality: Left;  . KNEE ARTHROSCOPY WITH LATERAL MENISECTOMY Left 04/04/2015   Procedure: AND PARTIAL LATERAL MENISECTOMY;  Surgeon: Leandrew Koyanagi, MD;  Location: [facility];  Service: Orthopedics;  Laterality: Left;  . KNEE ARTHROSCOPY WITH MEDIAL MENISECTOMY Left 04/04/2015   Procedure: LEFT KNEE ARTHROSCOPY WITH PARTIAL MEDIAL MENISCECTOMY  AND SYNOVECTOMY;  Surgeon:  Leandrew Koyanagi, MD;  Location: [facility];  Service: Orthopedics;  Laterality: Left;  . SHOULDER ARTHROSCOPY Bilateral 08/10/2014   Procedure: I & D BILATERAL SHOULDERS ;  Surgeon: Marianna Payment, MD;  Location: [facility];  Service: Orthopedics;  Laterality: Bilateral;  . SMALL INTESTINE SURGERY     Due to Small Bowel Obstruction; "fixed it when they did my gallbladder OR"  . TEE WITHOUT CARDIOVERSION N/A 08/14/2014   Procedure: TRANSESOPHAGEAL ECHOCARDIOGRAM (TEE);  Surgeon: Thayer Headings, MD;  Location: [facility];  Service: Cardiovascular;  Laterality: N/A;  . TENOSYNOVECTOMY Right 08/11/2014   Procedure: RIGHT WRIST IRRIGATION AND DEBRIDEMENT, TENOSYNOVECTOMY;  Surgeon: Marianna Payment, MD;  Location: [facility];  Service: Orthopedics;  Laterality: Right;  . TOTAL KNEE ARTHROPLASTY Left 11/07/2015   Procedure: LEFT TOTAL KNEE ARTHROPLASTY WITH REVISION OF IMPLANTS;  Surgeon: Leandrew Koyanagi, MD;  Location: [facility];  Service: Orthopedics;  Laterality: Left;  . TUBAL LIGATION      There were no vitals filed for this visit.  Subjective Assessment - 11/11/17 1020    Subjective  Just got back from trip to [place] and started wearing prosthesis again today (not able to take it with her due to long car ride/hotel room/etc). No pain or falls to report.     Pertinent History  left TFA, DM, neuropathy, Hep C, HTN, CHF, depression, CKD st 5 not dialysis, arthritis, heart murmur,     Limitations  Standing;Walking;House hold activities    Patient Stated Goals  To walk with prosthesis in home & community, travel plane, car & cruise in September    Currently in Pain?  No/denies    Pain Score  0-No pain          [facility] Adult PT Treatment/Exercise - 11/11/17 1021      Transfers   Transfers  Sit to Stand;Stand to Sit    Sit to Stand  5: Supervision;With upper extremity assist;With armrests;From chair/3-in-1;4: Min guard    Sit to Stand Details  Verbal cues for sequencing;Verbal cues for  technique;Verbal cues for precautions/safety;Verbal cues for safe use of DME/AE    Sit to Stand Details (indicate cue type and reason)  needs RW to stabilize (or sturdy surface)    Stand to Sit  5: Supervision;With upper extremity assist;With armrests;To chair/3-in-1;4: Min guard    Stand to Sit Details (indicate cue type and reason)  Verbal cues for precautions/safety;Verbal cues for safe use of DME/AE      Ambulation/Gait   Ambulation/Gait  Yes    Ambulation/Gait Assistance  4: Min guard    Ambulation/Gait Assistance Details  multimodal cues needed for posture, step placement of prosthesis, weight shifting onto prosthesis in stance and for bil step length. pt with heavy use of UE's on RW.  Ambulation Distance (Feet)  130 Feet x2,     Assistive device  Rolling walker;Prosthesis    Gait Pattern  Step-through pattern;Decreased stance time - left;Decreased step length - right;Decreased hip/knee flexion - left;Decreased weight shift to left;Antalgic;Lateral hip instability;Trunk flexed;Abducted - left    Ambulation Surface  Level;Indoor    Stairs  Yes    Stairs Assistance  5: Supervision;4: Min guard    Stairs Assistance Details (indicate cue type and reason)  cues on sequencing and step placement. assistance needed for prosthetic placement and control for safety      Stair Management Technique  Two rails;Step to pattern;Forwards    Number of Stairs  4    Height of Stairs  6      Prosthetics   Prosthetic Care Comments   cues/assistance needed to fully tighten suspension strap several times with session; reinforced consistent wear with increased wear time     Current prosthetic wear tolerance (days/week)   daily    Current prosthetic wear tolerance (#hours/day)   goal for 3 hours 2x day today, working toward the 4-5 hours 2x day    Residual limb condition   intact per pt report.    Education Provided  Skin check;Residual limb care;Proper Donning;Proper wear schedule/adjustment    Person(s)  Educated  Patient    Education Method  Explanation;Demonstration;Verbal cues    Education Method  Verbalized understanding;Tactile cues required;Verbal cues required;Needs further instruction    Donning Prosthesis  Supervision    Doffing Prosthesis  Supervision          PT Short Term Goals - 11/05/17 1628      PT SHORT TERM GOAL #1   Title  Patient verbalizes initial understanding of need to adjust ply socks with limb volume changes. (All STGs Target Date: 12/04/2017)    Time  4    Period  Weeks    Status  New    Target Date  12/04/17      PT SHORT TERM GOAL #2   Title  Patient tolerates prosthesis wear >10hrs total /day without skin issues.     Time  4    Period  Weeks    Status  New    Target Date  12/04/17      PT SHORT TERM GOAL #3   Title  Patient standing balance without UE support stands 2 minutes and reaches 2" anteriorly with supervision.     Time  4    Period  Weeks    Status  New    Target Date  12/04/17      PT SHORT TERM GOAL #4   Title  Patient ambulates 150' outdoors on paved surfaces with RW & prosthesis with supervision.     Time  4    Period  Weeks    Status  New    Target Date  12/04/17      PT SHORT TERM GOAL #5   Title  Patient negotiates stairs with 1 rail/cane, ramp & curb with RW & prosthesis with supervision.     Time  4    Period  Weeks    Status  New    Target Date  12/04/17        PT Long Term Goals - 10/22/17 1447      PT LONG TERM GOAL #1   Title  Patient verbalizes & demonstrates proper prosthetic care to enable safe use of prosthesis. (All LTGs Target Date: 01/01/2018)    Time  12  Period  Weeks    Status  On-going    Target Date  01/01/18      PT LONG TERM GOAL #2   Title  Patient tolerates prosthesis wear >80% of awake hours without skin issues or limb pain to enable function throughout her day.     Time  12    Period  Weeks    Status  On-going    Target Date  01/01/18      PT LONG TERM GOAL #3   Title  Patient  ambulates 500' outdoors including grass with LRAD & prosthesis modified independent for community mobility.     Time  12    Period  Weeks    Status  On-going    Target Date  01/01/18      PT LONG TERM GOAL #4   Title  Patient negotiates ramps, curbs & stairs with LRAD & prosthesis modified independent to enable community mobility.     Time  12    Period  Weeks    Status  On-going    Target Date  01/01/18      PT LONG TERM GOAL #5   Title  Berg Balance Test >/= 30/56    Time  12    Period  Weeks    Status  On-going    Target Date  01/01/18            Plan - 11/11/17 1021    Clinical Impression Statement  Today's skilled session focused on use of prosthesis for gait and on barriers. Pt needed increased assistance with increased fatigue with activities today, most likely due to not wearing/using prosthesis while on vacation. The pt should benefit from continued PT to progress toward unmet goals.                   Rehab Potential  Good    Clinical Impairments Affecting Rehab Potential  transfemoral level amputation with multiple medical issues, significantly limited activity level for >2 years    PT Frequency  2x / week    PT Duration  12 weeks    PT Treatment/Interventions  ADLs/Self Care Home Management;Moist Heat;DME Instruction;Gait training;Stair training;Functional mobility training;Therapeutic activities;Therapeutic exercise;Balance training;Neuromuscular re-education;Patient/family education;Prosthetic Training;Manual techniques;Vestibular    PT Next Visit Plan  prosthetic gait with RW including ramps, curbs & stairs, work on turns 90*, 180* & 360*, balance reaching & picking up objects    Consulted and Agree with Plan of Care  Patient       Patient will benefit from skilled therapeutic intervention in order to improve the following deficits and impairments:  Abnormal gait, Decreased activity tolerance, Decreased balance, Decreased endurance, Decreased knowledge of use of  DME, Decreased mobility, Decreased strength, Decreased range of motion, Impaired flexibility, Prosthetic Dependency, Postural dysfunction  Visit Diagnosis: Other abnormalities of gait and mobility  Muscle weakness (generalized)  Unsteadiness on feet     Problem List Patient Active Problem List   Diagnosis Date Noted  . Benign essential HTN   . Stage 5 chronic kidney disease not on chronic dialysis ([facility])   . Above knee amputation status, left ([facility])   . DM type 2 causing CKD stage 5 ([facility]) 03/21/2017  . Anemia of chronic disease 02/05/2017  . Dysplasia of cervix, high grade CIN 2 01/12/2017  . Chronic diastolic CHF (congestive heart failure) ([facility]) 11/08/2015  . Total knee replacement status   . Diabetic peripheral neuropathy ([facility])   . Major depression, recurrent, chronic ([facility])   .  Chronic kidney disease (CKD), stage IV (severe) ([facility])   . Hepatitis C 08/16/2014  . Peripheral neuropathy 06/24/2013  . Compulsive tobacco user syndrome 06/24/2013    Willow Ora, PTA, [facility] [address] [place], [place] [address] [phone] 11/11/17, 9:19 PM   Name: Brandy Houston MRN: [id number] Date of Birth: 1955/08/04

## 2017-11-12 ENCOUNTER — Ambulatory Visit (HOSPITAL_COMMUNITY)
Admission: RE | Admit: 2017-11-12 | Discharge: 2017-11-12 | Disposition: A | Discharge status: Home / Self Care | Payer: No Typology Code available for payment source | Source: Ambulatory Visit | Attending: Nephrology | Admitting: Nephrology

## 2017-11-12 VITALS — BP 106/70 | HR 65 | Temp 98.8°F | Resp 18

## 2017-11-12 DIAGNOSIS — N184 Chronic kidney disease, stage 4 (severe): Secondary | ICD-10-CM | POA: Diagnosis not present

## 2017-11-12 DIAGNOSIS — I5032 Chronic diastolic (congestive) heart failure: Secondary | ICD-10-CM | POA: Diagnosis not present

## 2017-11-12 DIAGNOSIS — E1122 Type 2 diabetes mellitus with diabetic chronic kidney disease: Secondary | ICD-10-CM | POA: Diagnosis not present

## 2017-11-12 DIAGNOSIS — T8241XD Breakdown (mechanical) of vascular dialysis catheter, subsequent encounter: Secondary | ICD-10-CM | POA: Diagnosis not present

## 2017-11-12 DIAGNOSIS — D631 Anemia in chronic kidney disease: Secondary | ICD-10-CM | POA: Diagnosis not present

## 2017-11-12 DIAGNOSIS — I130 Hypertensive heart and chronic kidney disease with heart failure and stage 1 through stage 4 chronic kidney disease, or unspecified chronic kidney disease: Secondary | ICD-10-CM | POA: Diagnosis not present

## 2017-11-12 DIAGNOSIS — Z794 Long term (current) use of insulin: Secondary | ICD-10-CM | POA: Diagnosis not present

## 2017-11-12 LAB — POCT HEMOGLOBIN-HEMACUE: Hemoglobin: 10.5 g/dL — ABNORMAL LOW (ref 12.0–15.0)

## 2017-11-12 MED ORDER — DARBEPOETIN ALFA 150 MCG/0.3ML IJ SOSY
150.0000 ug | PREFILLED_SYRINGE | INTRAMUSCULAR | Status: DC
  Administered 2017-11-12: 150 ug via SUBCUTANEOUS

## 2017-11-12 MED ORDER — DARBEPOETIN ALFA 150 MCG/0.3ML IJ SOSY
PREFILLED_SYRINGE | INTRAMUSCULAR | Status: AC
  Filled 2017-11-12: qty 0.3

## 2017-11-13 ENCOUNTER — Ambulatory Visit: Payer: Medicare Other | Admitting: Physical Therapy

## 2017-11-13 ENCOUNTER — Encounter: Payer: Self-pay | Admitting: Physical Therapy

## 2017-11-13 DIAGNOSIS — R2681 Unsteadiness on feet: Secondary | ICD-10-CM

## 2017-11-13 DIAGNOSIS — R2689 Other abnormalities of gait and mobility: Secondary | ICD-10-CM

## 2017-11-13 DIAGNOSIS — M6281 Muscle weakness (generalized): Secondary | ICD-10-CM

## 2017-11-13 NOTE — Therapy (Signed)
[facility] [address] [place], [place], [address] Phone: [phone]   Fax:  [phone]  Physical Therapy Treatment  Patient Details  Name: Brandy Houston MRN: [id number] Date of Birth: Feb 10, 1956 Referring Provider: Meridee Score, MD   Encounter Date: 11/13/2017  PT End of Session - 11/13/17 1108    Visit Number  10    Number of Visits  25    Date for PT Re-Evaluation  01/05/18    Authorization Type  Medicare & Medicaid    PT Start Time  1105    PT Stop Time  1145    PT Time Calculation (min)  40 min    Equipment Utilized During Treatment  Gait belt    Activity Tolerance  Patient tolerated treatment well    Behavior During Therapy  WFL for tasks assessed/performed       Past Medical History:  Diagnosis Date  . Anemia   . Anxiety   . Arthritis    "in my joints" (03/10/2017)  . Chronic diastolic CHF (congestive heart failure) ([place])   . CKD (chronic kidney disease), stage IV ([place])    stage IV. previous HD, none currently 10/30/15 (confirmed 02/05/2017 & 03/10/2017)  . Depression    Chronic  . Diabetic peripheral neuropathy ([place])   . Dysrhythmia    tachycardia, normal ECHO 08-09-14  . GERD (gastroesophageal reflux disease)   . Heart murmur     dx'd 02/05/2017  . Hepatitis C    "tx'd in 2016; I'm negative now" (02/05/2017)  . History of blood transfusion 2017   "w/knee replacement"  . Hypertension   . Osteoarthritis of left knee   . Peripheral neuropathy   . Protein calorie malnutrition ([place])   . Septic arthritis ([place])   . Slow transit constipation   . Type II diabetes mellitus ([name])    IDDM  . Uncontrolled hypertension 02/18/2013  . Unsteady gait     Past Surgical History:  Procedure Laterality Date  . AMPUTATION Left 03/20/2017   Procedure: LEFT ABOVE KNEE AMPUTATION;  Surgeon: Newt Minion, MD;  Location: [facility];  Service: Orthopedics;  Laterality: Left;  . APPENDECTOMY    . AV FISTULA PLACEMENT Left  09/13/2014   Procedure: Brachial Artery to Brachial Vein Gortex Four - Seven Stretch GRAFT INSERTION Left Forearm;  Surgeon: Mal Misty, MD;  Location: [facility];  Service: Vascular;  Laterality: Left;  . BASCILIC VEIN TRANSPOSITION Right 10/26/2017   Procedure: BASILIC VEIN TRANSPOSITION FIRST STAGE RIGHT ARM;  Surgeon: Conrad Pekin, MD;  Location: [facility];  Service: Vascular;  Laterality: Right;  . CHOLECYSTECTOMY OPEN    . COLON SURGERY    . EXCISIONAL TOTAL KNEE ARTHROPLASTY WITH ANTIBIOTIC SPACERS Left 02/06/2017   Procedure: Incisional total iknee with antibiotic spacer ;  Surgeon: Leandrew Koyanagi, MD;  Location: [facility];  Service: Orthopedics;  Laterality: Left;  . IR FLUORO GUIDE CV LINE RIGHT  02/10/2017  . IR REMOVAL TUN CV CATH W/O FL  04/01/2017  . IR US GUIDE VASC ACCESS RIGHT  02/10/2017  . IRRIGATION AND DEBRIDEMENT KNEE Left 03/12/2017   Procedure: IRRIGATION AND DEBRIDEMENT LEFT KNEE WITH WOUND VAC APPLICATION;  Surgeon: Leandrew Koyanagi, MD;  Location: [facility];  Service: Orthopedics;  Laterality: Left;  . JOINT REPLACEMENT    . KNEE ARTHROSCOPY Right 08/10/2014   Procedure: ARTHROSCOPY I & D KNEE;  Surgeon: Marianna Payment, MD;  Location: [facility];  Service: Orthopedics;  Laterality:  Right;  [name] KNEE ARTHROSCOPY Left 08/11/2014   Procedure: ARTHROSCOPIC WASHOUT LEFT KNEE;  Surgeon: Marianna Payment, MD;  Location: [facility];  Service: Orthopedics;  Laterality: Left;  . KNEE ARTHROSCOPY Left 08/19/2014   Procedure: ARTHROSCOPIC WASHOUT LEFT KNEE;  Surgeon: Leandrew Koyanagi, MD;  Location: [facility];  Service: Orthopedics;  Laterality: Left;  . KNEE ARTHROSCOPY WITH LATERAL MENISECTOMY Left 04/04/2015   Procedure: AND PARTIAL LATERAL MENISECTOMY;  Surgeon: Leandrew Koyanagi, MD;  Location: [facility];  Service: Orthopedics;  Laterality: Left;  . KNEE ARTHROSCOPY WITH MEDIAL MENISECTOMY Left 04/04/2015   Procedure: LEFT KNEE ARTHROSCOPY WITH PARTIAL MEDIAL MENISCECTOMY  AND SYNOVECTOMY;  Surgeon:  Leandrew Koyanagi, MD;  Location: [facility];  Service: Orthopedics;  Laterality: Left;  . SHOULDER ARTHROSCOPY Bilateral 08/10/2014   Procedure: I & D BILATERAL SHOULDERS ;  Surgeon: Marianna Payment, MD;  Location: [facility];  Service: Orthopedics;  Laterality: Bilateral;  . SMALL INTESTINE SURGERY     Due to Small Bowel Obstruction; "fixed it when they did my gallbladder OR"  . TEE WITHOUT CARDIOVERSION N/A 08/14/2014   Procedure: TRANSESOPHAGEAL ECHOCARDIOGRAM (TEE);  Surgeon: Thayer Headings, MD;  Location: [facility];  Service: Cardiovascular;  Laterality: N/A;  . TENOSYNOVECTOMY Right 08/11/2014   Procedure: RIGHT WRIST IRRIGATION AND DEBRIDEMENT, TENOSYNOVECTOMY;  Surgeon: Marianna Payment, MD;  Location: [facility];  Service: Orthopedics;  Laterality: Right;  . TOTAL KNEE ARTHROPLASTY Left 11/07/2015   Procedure: LEFT TOTAL KNEE ARTHROPLASTY WITH REVISION OF IMPLANTS;  Surgeon: Leandrew Koyanagi, MD;  Location: [facility];  Service: Orthopedics;  Laterality: Left;  . TUBAL LIGATION      There were no vitals filed for this visit.  Subjective Assessment - 11/13/17 1107    Subjective  No new complaints. No falls or pain to report.     Pertinent History  left TFA, DM, neuropathy, Hep C, HTN, CHF, depression, CKD st 5 not dialysis, arthritis, heart murmur,     Limitations  Standing;Walking;House hold activities    Patient Stated Goals  To walk with prosthesis in home & community, travel plane, car & cruise in September    Currently in Pain?  No/denies    Pain Score  0-No pain            [facility] Adult PT Treatment/Exercise - 11/13/17 1109      Transfers   Transfers  Sit to Stand;Stand to Sit    Sit to Stand  5: Supervision;With upper extremity assist;With armrests;From chair/3-in-1;4: Min guard    Sit to Stand Details  Verbal cues for sequencing;Verbal cues for technique;Verbal cues for precautions/safety;Verbal cues for safe use of DME/AE    Sit to Stand Details (indicate cue type and  reason)  min guard with reminder cues to stand from chair without armrests. needs RW to stabilize once standing.     Stand to Sit  5: Supervision;With upper extremity assist;With armrests;To chair/3-in-1;4: Min guard    Stand to Sit Details (indicate cue type and reason)  Verbal cues for precautions/safety;Verbal cues for safe use of DME/AE    Stand to Sit Details  min guard assist to sit in chair without armrests.     Floor to Transfer  4: Min assist    Floor to Transfer Details (indicate cue type and reason)  with red mat on floor: PTA demo' how to get down and back up prior to pt's performance. cues needed throughout with step by step instructions for  sequencing with pt performance.        Ambulation/Gait   Ambulation/Gait  Yes    Ambulation/Gait Assistance  4: Min guard    Ambulation/Gait Assistance Details  cues on posture, prosthetic step placement, weight shifting and bil step length     Ambulation Distance (Feet)  130 Feet x1, 25 x2    Assistive device  Rolling walker;Prosthesis    Gait Pattern  Step-through pattern;Decreased stance time - left;Decreased step length - right;Decreased hip/knee flexion - left;Decreased weight shift to left;Antalgic;Lateral hip instability;Trunk flexed;Abducted - left    Ambulation Surface  Level;Indoor    Stairs  Yes    Stairs Assistance  5: Supervision;4: Min guard    Stairs Assistance Details (indicate cue type and reason)  cues on hand advancement on rails, prosthetic step placement with descent and weight shifitng     Stair Management Technique  Two rails;Step to pattern;Forwards    Number of Stairs  4    Height of Stairs  6    Ramp  4: Min assist    Ramp Details (indicate cue type and reason)  with RW/prosthesis- cues on sequencing, posture and technique.     Curb  4: Min assist    Curb Details (indicate cue type and reason)  with RW/prosthesis- cues on stance position and sequencing       Prosthetics   Prosthetic Care Comments   continues to  have inconsistent wear since returning from vacation due to "i was busy,". Reinforced consistent wear every day.     Current prosthetic wear tolerance (days/week)   most days    Current prosthetic wear tolerance (#hours/day)   goal for 3 hours 2x day today, working toward the 4-5 hours 2x day    Residual limb condition   intact per pt report.    Education Provided  Residual limb care;Proper wear schedule/adjustment;Proper weight-bearing schedule/adjustment    Person(s) Educated  Patient    Education Method  Explanation;Demonstration;Verbal cues    Education Method  Verbalized understanding;Verbal cues required;Needs further instruction    Donning Prosthesis  Supervision    Doffing Prosthesis  Supervision         PT Short Term Goals - 11/05/17 1628      PT SHORT TERM GOAL #1   Title  Patient verbalizes initial understanding of need to adjust ply socks with limb volume changes. (All STGs Target Date: 12/04/2017)    Time  4    Period  Weeks    Status  New    Target Date  12/04/17      PT SHORT TERM GOAL #2   Title  Patient tolerates prosthesis wear >10hrs total /day without skin issues.     Time  4    Period  Weeks    Status  New    Target Date  12/04/17      PT SHORT TERM GOAL #3   Title  Patient standing balance without UE support stands 2 minutes and reaches 2" anteriorly with supervision.     Time  4    Period  Weeks    Status  New    Target Date  12/04/17      PT SHORT TERM GOAL #4   Title  Patient ambulates 150' outdoors on paved surfaces with RW & prosthesis with supervision.     Time  4    Period  Weeks    Status  New    Target Date  12/04/17  PT SHORT TERM GOAL #5   Title  Patient negotiates stairs with 1 rail/cane, ramp & curb with RW & prosthesis with supervision.     Time  4    Period  Weeks    Status  New    Target Date  12/04/17        PT Long Term Goals - 10/22/17 1447      PT LONG TERM GOAL #1   Title  Patient verbalizes & demonstrates proper  prosthetic care to enable safe use of prosthesis. (All LTGs Target Date: 01/01/2018)    Time  12    Period  Weeks    Status  On-going    Target Date  01/01/18      PT LONG TERM GOAL #2   Title  Patient tolerates prosthesis wear >80% of awake hours without skin issues or limb pain to enable function throughout her day.     Time  12    Period  Weeks    Status  On-going    Target Date  01/01/18      PT LONG TERM GOAL #3   Title  Patient ambulates 500' outdoors including grass with LRAD & prosthesis modified independent for community mobility.     Time  12    Period  Weeks    Status  On-going    Target Date  01/01/18      PT LONG TERM GOAL #4   Title  Patient negotiates ramps, curbs & stairs with LRAD & prosthesis modified independent to enable community mobility.     Time  12    Period  Weeks    Status  On-going    Target Date  01/01/18      PT LONG TERM GOAL #5   Title  Berg Balance Test >/= 30/56    Time  12    Period  Weeks    Status  On-going    Target Date  01/01/18         Plan - 11/13/17 1109    Clinical Impression Statement  Today's skilled session continued to focus on prosthetic use with gait and on barriers. Also addressed floor transfers with prosthesis at pt's request to know how to get up should she fall. Pt needed up to min assist with cues. At this time she states she will take the prosthesis off and get up with just her sound leg as it's easier. Pt is progressing toward goals and should benefit from continued PT to progress toward unmet goals.     Rehab Potential  Good    Clinical Impairments Affecting Rehab Potential  transfemoral level amputation with multiple medical issues, significantly limited activity level for >2 years    PT Frequency  2x / week    PT Duration  12 weeks    PT Treatment/Interventions  ADLs/Self Care Home Management;Moist Heat;DME Instruction;Gait training;Stair training;Functional mobility training;Therapeutic activities;Therapeutic  exercise;Balance training;Neuromuscular re-education;Patient/family education;Prosthetic Training;Manual techniques;Vestibular    PT Next Visit Plan  prosthetic gait with RW including ramps, curbs & stairs, work on turns 90*, 180* & 360*, balance reaching & picking up objects    Consulted and Agree with Plan of Care  Patient       Patient will benefit from skilled therapeutic intervention in order to improve the following deficits and impairments:  Abnormal gait, Decreased activity tolerance, Decreased balance, Decreased endurance, Decreased knowledge of use of DME, Decreased mobility, Decreased strength, Decreased range of motion, Impaired flexibility, Prosthetic Dependency, Postural dysfunction  Visit Diagnosis: Other abnormalities of gait and mobility  Muscle weakness (generalized)  Unsteadiness on feet     Problem List Patient Active Problem List   Diagnosis Date Noted  . Benign essential HTN   . Stage 5 chronic kidney disease not on chronic dialysis ([place])   . Above knee amputation status, left ([place])   . DM type 2 causing CKD stage 5 ([place]) 03/21/2017  . Anemia of chronic disease 02/05/2017  . Dysplasia of cervix, high grade CIN 2 01/12/2017  . Chronic diastolic CHF (congestive heart failure) ([place]) 11/08/2015  . Total knee replacement status   . Diabetic peripheral neuropathy ([place])   . Major depression, recurrent, chronic ([place])   . Chronic kidney disease (CKD), stage IV (severe) ([place])   . Hepatitis C 08/16/2014  . Peripheral neuropathy 06/24/2013  . Compulsive tobacco user syndrome 06/24/2013    Willow Ora, PTA, [facility] [address], [place], [place] [address] [phone] 11/13/17, 9:47 PM   Name: Brandy Houston MRN: [id number] Date of Birth: [birth year]

## 2017-11-16 DIAGNOSIS — F329 Major depressive disorder, single episode, unspecified: Secondary | ICD-10-CM | POA: Diagnosis not present

## 2017-11-16 DIAGNOSIS — Z794 Long term (current) use of insulin: Secondary | ICD-10-CM | POA: Diagnosis not present

## 2017-11-16 DIAGNOSIS — N185 Chronic kidney disease, stage 5: Secondary | ICD-10-CM | POA: Diagnosis not present

## 2017-11-16 DIAGNOSIS — I120 Hypertensive chronic kidney disease with stage 5 chronic kidney disease or end stage renal disease: Secondary | ICD-10-CM | POA: Diagnosis not present

## 2017-11-16 DIAGNOSIS — E1122 Type 2 diabetes mellitus with diabetic chronic kidney disease: Secondary | ICD-10-CM | POA: Diagnosis not present

## 2017-11-16 DIAGNOSIS — I130 Hypertensive heart and chronic kidney disease with heart failure and stage 1 through stage 4 chronic kidney disease, or unspecified chronic kidney disease: Secondary | ICD-10-CM | POA: Diagnosis not present

## 2017-11-16 DIAGNOSIS — I5032 Chronic diastolic (congestive) heart failure: Secondary | ICD-10-CM | POA: Diagnosis not present

## 2017-11-16 DIAGNOSIS — G629 Polyneuropathy, unspecified: Secondary | ICD-10-CM | POA: Diagnosis not present

## 2017-11-16 DIAGNOSIS — N049 Nephrotic syndrome with unspecified morphologic changes: Secondary | ICD-10-CM | POA: Diagnosis not present

## 2017-11-16 DIAGNOSIS — N2581 Secondary hyperparathyroidism of renal origin: Secondary | ICD-10-CM | POA: Diagnosis not present

## 2017-11-16 DIAGNOSIS — D631 Anemia in chronic kidney disease: Secondary | ICD-10-CM | POA: Diagnosis not present

## 2017-11-16 DIAGNOSIS — T8241XD Breakdown (mechanical) of vascular dialysis catheter, subsequent encounter: Secondary | ICD-10-CM | POA: Diagnosis not present

## 2017-11-16 DIAGNOSIS — N184 Chronic kidney disease, stage 4 (severe): Secondary | ICD-10-CM | POA: Diagnosis not present

## 2017-11-17 ENCOUNTER — Encounter: Payer: Self-pay | Admitting: Physical Therapy

## 2017-11-17 ENCOUNTER — Ambulatory Visit: Payer: Medicare Other | Attending: Orthopedic Surgery | Admitting: Physical Therapy

## 2017-11-17 VITALS — BP 174/73 | HR 67

## 2017-11-17 DIAGNOSIS — R2689 Other abnormalities of gait and mobility: Secondary | ICD-10-CM

## 2017-11-17 DIAGNOSIS — M6249 Contracture of muscle, multiple sites: Secondary | ICD-10-CM | POA: Diagnosis not present

## 2017-11-17 DIAGNOSIS — R2681 Unsteadiness on feet: Secondary | ICD-10-CM | POA: Insufficient documentation

## 2017-11-17 DIAGNOSIS — M6281 Muscle weakness (generalized): Secondary | ICD-10-CM | POA: Diagnosis not present

## 2017-11-18 NOTE — Therapy (Signed)
[facility] [address] [place], [place], [address] Phone: [phone]   Fax:  [phone]  Physical Therapy Treatment  Patient Details  Name: Brandy Houston MRN: [id number] Date of Birth: [birth year] Referring Provider: Meridee Score, MD   Encounter Date: 11/17/2017  PT End of Session - 11/17/17 2316    Visit Number  10    Number of Visits  25    Date for PT Re-Evaluation  01/05/18    Authorization Type  Medicare & Medicaid    PT Start Time  7867    PT Stop Time  1105    PT Time Calculation (min)  42 min    Equipment Utilized During Treatment  Gait belt    Activity Tolerance  Patient tolerated treatment well    Behavior During Therapy  WFL for tasks assessed/performed       Past Medical History:  Diagnosis Date  . Anemia   . Anxiety   . Arthritis    "in my joints" (03/10/2017)  . Chronic diastolic CHF (congestive heart failure) ([place])   . CKD (chronic kidney disease), stage IV ([place])    stage IV. previous HD, none currently 10/30/15 (confirmed 02/05/2017 & 03/10/2017)  . Depression    Chronic  . Diabetic peripheral neuropathy ([facility])   . Dysrhythmia    tachycardia, normal ECHO 08-09-14  . GERD (gastroesophageal reflux disease)   . Heart murmur     dx'd 02/05/2017  . Hepatitis C    "tx'd in 2016; I'm negative now" (02/05/2017)  . History of blood transfusion 2017   "w/knee replacement"  . Hypertension   . Osteoarthritis of left knee   . Peripheral neuropathy   . Protein calorie malnutrition ([place])   . Septic arthritis ([facility])   . Slow transit constipation   . Type II diabetes mellitus ([name])    IDDM  . Uncontrolled hypertension 02/18/2013  . Unsteady gait     Past Surgical History:  Procedure Laterality Date  . AMPUTATION Left 03/20/2017   Procedure: LEFT ABOVE KNEE AMPUTATION;  Surgeon: Newt Minion, MD;  Location: [facility];  Service: Orthopedics;  Laterality: Left;  . APPENDECTOMY    . AV FISTULA PLACEMENT Left  09/13/2014   Procedure: Brachial Artery to Brachial Vein Gortex Four - Seven Stretch GRAFT INSERTION Left Forearm;  Surgeon: Mal Misty, MD;  Location: [facility];  Service: Vascular;  Laterality: Left;  . BASCILIC VEIN TRANSPOSITION Right 10/26/2017   Procedure: BASILIC VEIN TRANSPOSITION FIRST STAGE RIGHT ARM;  Surgeon: Conrad [place], MD;  Location: [facility];  Service: Vascular;  Laterality: Right;  . CHOLECYSTECTOMY OPEN    . COLON SURGERY    . EXCISIONAL TOTAL KNEE ARTHROPLASTY WITH ANTIBIOTIC SPACERS Left 02/06/2017   Procedure: Incisional total iknee with antibiotic spacer ;  Surgeon: Leandrew Koyanagi, MD;  Location: [facility];  Service: Orthopedics;  Laterality: Left;  . IR FLUORO GUIDE CV LINE RIGHT  02/10/2017  . IR REMOVAL TUN CV CATH W/O FL  04/01/2017  . IR US GUIDE VASC ACCESS RIGHT  02/10/2017  . IRRIGATION AND DEBRIDEMENT KNEE Left 03/12/2017   Procedure: IRRIGATION AND DEBRIDEMENT LEFT KNEE WITH WOUND VAC APPLICATION;  Surgeon: Leandrew Koyanagi, MD;  Location: [facility];  Service: Orthopedics;  Laterality: Left;  . JOINT REPLACEMENT    . KNEE ARTHROSCOPY Right 08/10/2014   Procedure: ARTHROSCOPY I & D KNEE;  Surgeon: Marianna Payment, MD;  Location: [facility];  Service: Orthopedics;  Laterality:  Right;  [name] KNEE ARTHROSCOPY Left 08/11/2014   Procedure: ARTHROSCOPIC WASHOUT LEFT KNEE;  Surgeon: Marianna Payment, MD;  Location: [facility];  Service: Orthopedics;  Laterality: Left;  . KNEE ARTHROSCOPY Left 08/19/2014   Procedure: ARTHROSCOPIC WASHOUT LEFT KNEE;  Surgeon: Leandrew Koyanagi, MD;  Location: [facility];  Service: Orthopedics;  Laterality: Left;  . KNEE ARTHROSCOPY WITH LATERAL MENISECTOMY Left 04/04/2015   Procedure: AND PARTIAL LATERAL MENISECTOMY;  Surgeon: Leandrew Koyanagi, MD;  Location: [facility];  Service: Orthopedics;  Laterality: Left;  . KNEE ARTHROSCOPY WITH MEDIAL MENISECTOMY Left 04/04/2015   Procedure: LEFT KNEE ARTHROSCOPY WITH PARTIAL MEDIAL MENISCECTOMY  AND SYNOVECTOMY;  Surgeon:  Leandrew Koyanagi, MD;  Location: [facility];  Service: Orthopedics;  Laterality: Left;  . SHOULDER ARTHROSCOPY Bilateral 08/10/2014   Procedure: I & D BILATERAL SHOULDERS ;  Surgeon: Marianna Payment, MD;  Location: [facility];  Service: Orthopedics;  Laterality: Bilateral;  . SMALL INTESTINE SURGERY     Due to Small Bowel Obstruction; "fixed it when they did my gallbladder OR"  . TEE WITHOUT CARDIOVERSION N/A 08/14/2014   Procedure: TRANSESOPHAGEAL ECHOCARDIOGRAM (TEE);  Surgeon: Thayer Headings, MD;  Location: [facility];  Service: Cardiovascular;  Laterality: N/A;  . TENOSYNOVECTOMY Right 08/11/2014   Procedure: RIGHT WRIST IRRIGATION AND DEBRIDEMENT, TENOSYNOVECTOMY;  Surgeon: Marianna Payment, MD;  Location: [facility];  Service: Orthopedics;  Laterality: Right;  . TOTAL KNEE ARTHROPLASTY Left 11/07/2015   Procedure: LEFT TOTAL KNEE ARTHROPLASTY WITH REVISION OF IMPLANTS;  Surgeon: Leandrew Koyanagi, MD;  Location: [facility];  Service: Orthopedics;  Laterality: Left;  . TUBAL LIGATION      Vitals:   11/17/17 1027  BP: (!) 174/73  Pulse: 67    Subjective Assessment - 11/17/17 1025    Subjective  She has been feeling well. Doctor is starting dialysis soon. Her entrance has steps & SCAT will not take w/c up/down steps.     Pertinent History  left TFA, DM, neuropathy, Hep C, HTN, CHF, depression, CKD st 5 not dialysis, arthritis, heart murmur,     Limitations  Standing;Walking;House hold activities    Patient Stated Goals  To walk with prosthesis in home & community, travel plane, car & cruise in September    Currently in Pain?  No/denies                       [facility] Adult PT Treatment/Exercise - 11/17/17 1015      Transfers   Transfers  Sit to Stand;Stand to Sit    Sit to Stand  5: Supervision;With upper extremity assist;With armrests;From chair/3-in-1;4: Min guard to RW, chairs with & without armrests    Sit to Stand Details  Verbal cues for sequencing;Verbal cues for  technique;Verbal cues for precautions/safety;Verbal cues for safe use of DME/AE    Stand to Sit  5: Supervision;With upper extremity assist;With armrests;To chair/3-in-1 from RW, chairs with & without armrests    Stand to Sit Details (indicate cue type and reason)  Verbal cues for precautions/safety;Verbal cues for safe use of DME/AE    Floor to Transfer  --      Ambulation/Gait   Ambulation/Gait  Yes    Ambulation/Gait Assistance  5: Supervision    Ambulation/Gait Assistance Details  Verbal cues on step length, step through, upright trunk & intial contact with heel    Ambulation Distance (Feet)  160 Feet    Assistive device  Rolling walker;Prosthesis  Gait Pattern  Step-through pattern;Decreased stance time - left;Decreased step length - right;Decreased hip/knee flexion - left;Decreased weight shift to left;Antalgic;Lateral hip instability;Trunk flexed;Abducted - left    Ambulation Surface  Indoor;Level    Stairs  Yes    Stairs Assistance  5: Supervision    Stairs Assistance Details (indicate cue type and reason)  technique with single rail with cane    Stair Management Technique  One rail Left;With cane;Step to pattern;Forwards    Number of Stairs  4    Height of Stairs  6    Ramp  5: Supervision Min guard intermittent, RW & TFA prosthesis    Ramp Details (indicate cue type and reason)  verbal cueson sequence, wt shift over prosthesis in stance    Curb  5: Supervision RW & prosthesis    Curb Details (indicate cue type and reason)  verbal cues on technique      Prosthetics   Prosthetic Care Comments   Increase wear to 5 hrs 2x/day. Need to increase wear to increase function.  see pt education for increasing activity level    Current prosthetic wear tolerance (days/week)   daily    Current prosthetic wear tolerance (#hours/day)   3-4 hrs 2x/day, PT instructed to increase to 5hrs 2x/day    Residual limb condition   intact per pt report.    Education Provided  Residual limb care;Proper  wear schedule/adjustment;Proper weight-bearing schedule/adjustment    Person(s) Educated  Patient    Education Method  Explanation;Verbal cues    Education Method  Verbalized understanding;Verbal cues required;Needs further instruction             PT Education - 11/17/17 1045    Education provided  Yes    Education Details  Increase activity level by high frequency short walks (in home between rooms) with RW, with family supervision enter/exit home ambulating with RW and initiate community outings ambulating to tolerance.     Person(s) Educated  Patient    Methods  Explanation;Verbal cues    Comprehension  Verbalized understanding;Verbal cues required;Need further instruction       PT Short Term Goals - 11/05/17 1628      PT SHORT TERM GOAL #1   Title  Patient verbalizes initial understanding of need to adjust ply socks with limb volume changes. (All STGs Target Date: 12/04/2017)    Time  4    Period  Weeks    Status  New    Target Date  12/04/17      PT SHORT TERM GOAL #2   Title  Patient tolerates prosthesis wear >10hrs total /day without skin issues.     Time  4    Period  Weeks    Status  New    Target Date  12/04/17      PT SHORT TERM GOAL #3   Title  Patient standing balance without UE support stands 2 minutes and reaches 2" anteriorly with supervision.     Time  4    Period  Weeks    Status  New    Target Date  12/04/17      PT SHORT TERM GOAL #4   Title  Patient ambulates 150' outdoors on paved surfaces with RW & prosthesis with supervision.     Time  4    Period  Weeks    Status  New    Target Date  12/04/17      PT SHORT TERM GOAL #5   Title  Patient  negotiates stairs with 1 rail/cane, ramp & curb with RW & prosthesis with supervision.     Time  4    Period  Weeks    Status  New    Target Date  12/04/17        PT Long Term Goals - 10/22/17 1447      PT LONG TERM GOAL #1   Title  Patient verbalizes & demonstrates proper prosthetic care to  enable safe use of prosthesis. (All LTGs Target Date: 01/01/2018)    Time  12    Period  Weeks    Status  On-going    Target Date  01/01/18      PT LONG TERM GOAL #2   Title  Patient tolerates prosthesis wear >80% of awake hours without skin issues or limb pain to enable function throughout her day.     Time  12    Period  Weeks    Status  On-going    Target Date  01/01/18      PT LONG TERM GOAL #3   Title  Patient ambulates 500' outdoors including grass with LRAD & prosthesis modified independent for community mobility.     Time  12    Period  Weeks    Status  On-going    Target Date  01/01/18      PT LONG TERM GOAL #4   Title  Patient negotiates ramps, curbs & stairs with LRAD & prosthesis modified independent to enable community mobility.     Time  12    Period  Weeks    Status  On-going    Target Date  01/01/18      PT LONG TERM GOAL #5   Title  Berg Balance Test >/= 30/56    Time  12    Period  Weeks    Status  On-going    Target Date  01/01/18            Plan - 11/17/17 2000    Clinical Impression Statement  Patient appears safe to ambulate short distances with RW like room to room in home. She has basic understanding of community based gait activities and safe to initiate with family supervision.  Increase gait outside of PT should improve activity tolerance and wear so can function.     Rehab Potential  Good    Clinical Impairments Affecting Rehab Potential  transfemoral level amputation with multiple medical issues, significantly limited activity level for >2 years    PT Frequency  2x / week    PT Duration  12 weeks    PT Treatment/Interventions  ADLs/Self Care Home Management;Moist Heat;DME Instruction;Gait training;Stair training;Functional mobility training;Therapeutic activities;Therapeutic exercise;Balance training;Neuromuscular re-education;Patient/family education;Prosthetic Training;Manual techniques;Vestibular    PT Next Visit Plan  prosthetic gait  with RW including ramps, curbs & stairs, work on turns 90*, 180* & 360*, balance reaching & picking up objects    Consulted and Agree with Plan of Care  Patient       Patient will benefit from skilled therapeutic intervention in order to improve the following deficits and impairments:  Abnormal gait, Decreased activity tolerance, Decreased balance, Decreased endurance, Decreased knowledge of use of DME, Decreased mobility, Decreased strength, Decreased range of motion, Impaired flexibility, Prosthetic Dependency, Postural dysfunction  Visit Diagnosis: Other abnormalities of gait and mobility  Muscle weakness (generalized)  Unsteadiness on feet  Contracture of muscle, multiple sites     Problem List Patient Active Problem List   Diagnosis Date Noted  .  Benign essential HTN   . 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 Above knee amputation status, left ([facility])   . DM type 2 causing CKD stage 5 ([facility]) 03/21/2017  . Anemia of chronic disease 02/05/2017  . Dysplasia of cervix, high grade CIN 2 01/12/2017  . Chronic diastolic CHF (congestive heart failure) ([facility]) 11/08/2015  . Total knee replacement status   . Diabetic peripheral neuropathy ([facility])   . Major depression, recurrent, chronic ([facility])   . Chronic kidney disease (CKD), stage IV (severe) ([place])   . Hepatitis C 08/16/2014  . Peripheral neuropathy 06/24/2013  . Compulsive tobacco user syndrome 06/24/2013    Jamey Reas  PT, DPT 11/18/2017, 6:57 AM  [facility] [address] [place], [place], [address] Phone: [phone]   Fax:  [phone]  Name: Brandy Houston MRN: [id number] Date of Birth: [birth year]

## 2017-11-19 ENCOUNTER — Ambulatory Visit: Payer: Medicare Other | Admitting: Physical Therapy

## 2017-11-24 ENCOUNTER — Ambulatory Visit: Payer: Medicare Other | Admitting: Physical Therapy

## 2017-11-24 ENCOUNTER — Encounter: Payer: Self-pay | Admitting: Physical Therapy

## 2017-11-24 DIAGNOSIS — M6281 Muscle weakness (generalized): Secondary | ICD-10-CM | POA: Diagnosis not present

## 2017-11-24 DIAGNOSIS — R2681 Unsteadiness on feet: Secondary | ICD-10-CM

## 2017-11-24 DIAGNOSIS — N184 Chronic kidney disease, stage 4 (severe): Secondary | ICD-10-CM | POA: Diagnosis not present

## 2017-11-24 DIAGNOSIS — T8241XD Breakdown (mechanical) of vascular dialysis catheter, subsequent encounter: Secondary | ICD-10-CM | POA: Diagnosis not present

## 2017-11-24 DIAGNOSIS — M6249 Contracture of muscle, multiple sites: Secondary | ICD-10-CM | POA: Diagnosis not present

## 2017-11-24 DIAGNOSIS — Z794 Long term (current) use of insulin: Secondary | ICD-10-CM | POA: Diagnosis not present

## 2017-11-24 DIAGNOSIS — E1122 Type 2 diabetes mellitus with diabetic chronic kidney disease: Secondary | ICD-10-CM | POA: Diagnosis not present

## 2017-11-24 DIAGNOSIS — I5032 Chronic diastolic (congestive) heart failure: Secondary | ICD-10-CM | POA: Diagnosis not present

## 2017-11-24 DIAGNOSIS — R2689 Other abnormalities of gait and mobility: Secondary | ICD-10-CM

## 2017-11-24 DIAGNOSIS — I130 Hypertensive heart and chronic kidney disease with heart failure and stage 1 through stage 4 chronic kidney disease, or unspecified chronic kidney disease: Secondary | ICD-10-CM | POA: Diagnosis not present

## 2017-11-24 NOTE — Progress Notes (Signed)
Subjective:   Brandy Houston is a 62 y.o. female who presents for an Initial Medicare Annual Wellness Visit.  Review of Systems    No ROS.  Medicare Wellness Visit. Additional risk factors are reflected in the social history.  Cardiac Risk Factors include: diabetes mellitus;hypertension;family history of premature cardiovascular disease;smoking/ tobacco exposure   Sleep patterns: Sleeps well, 8 hours.  Home Safety/Smoke Alarms: Feels safe in home. Smoke alarms in place.  Living environment; residence and Firearm Safety: Lives with son and family in 2 story home, resides on first floor. Working on ramp.  Seat Belt Safety/Bike Helmet: Wears seat belt.   Female:   Pap-12/14/2016       Mammo-09/15/2017, benign.       Dexa scan-NA        [facility]S-Colonoscopy 07/01/2013, normal. Recall 5 years.      Objective:    There were no vitals filed for this visit. There is no height or weight on file to calculate BMI.  Advanced Directives 11/25/2017 10/26/2017 10/07/2017 04/10/2017 03/26/2017 03/10/2017 02/05/2017  Does Patient Have a Medical Advance Directive? No No No No No No No  Would patient like information on creating a medical advance directive? No - Patient declined - No - Patient declined - No - Patient declined No - Patient declined No - Patient declined  Pre-existing out of facility DNR order (yellow form or pink MOST form) - - - - - - -    Current Medications (verified) Outpatient Encounter Medications as of 11/25/2017  Medication Sig  . amLODipine (NORVASC) 10 MG tablet Take 1 tablet (10 mg total) by mouth at bedtime.  [name] aspirin EC 81 MG tablet Take 81 mg by mouth daily.  . Blood Glucose Monitoring Suppl (ACCU-CHEK AVIVA PLUS) W/DEVICE KIT Check sugars TID for E11.65 (Patient not taking: Reported on 10/20/2017)  . calcitRIOL (ROCALTROL) 0.25 MCG capsule Take 1 capsule (0.25 mcg total) by mouth every Monday, Wednesday, and Friday.  . calcium carbonate (TUMS EX) 750 MG chewable tablet Chew  1 tablet by mouth 3 (three) times daily as needed for heartburn.   . cloNIDine (CATAPRES) 0.2 MG tablet Take 0.2 mg by mouth 2 (two) times daily.  . ferrous sulfate 325 (65 FE) MG tablet Take 1 tablet (325 mg total) by mouth 2 (two) times daily with a meal.  . furosemide (LASIX) 80 MG tablet Take 1 tablet (80 mg total) by mouth 2 (two) times daily.  [name] gabapentin (NEURONTIN) 300 MG capsule TAKE 1 CAPSULE BY MOUTH THREE TIMES DAILY  . glucose blood (ACCU-CHEK AVIVA) test strip Check sugars TID for E11.65 (Patient not taking: Reported on 10/20/2017)  . hydrALAZINE (APRESOLINE) 25 MG tablet Take 4 tablets (100 mg total) by mouth 2 (two) times daily.  [name] Devices (ACCU-CHEK SOFTCLIX) lancets Check sugars TID for E11.65 (Patient not taking: Reported on 10/20/2017)  . methocarbamol (ROBAXIN) 500 MG tablet TAKE 1 TABLET BY MOUTH EVERY 6 HOURS AS NEEDED FOR MUSCLE SPASM  . metolazone (ZAROXOLYN) 2.5 MG tablet Take 2.5 mg by mouth daily.  . metoprolol succinate (TOPROL-XL) 50 MG 24 hr tablet Take 1 tablet (50 mg total) by mouth 2 (two) times daily.  . Multiple Vitamin (MULTIVITAMIN WITH MINERALS) TABS tablet Take 1 tablet by mouth daily.  [name] omeprazole (PRILOSEC) 20 MG capsule Take 20 mg by mouth daily before breakfast.   . oxyCODONE (ROXICODONE) 5 MG immediate release tablet Take 1 tablet (5 mg total) by mouth every 4 (four) hours as needed  for severe pain. (Patient not taking: Reported on 11/25/2017)  . polyethylene glycol (MIRALAX / GLYCOLAX) packet Take 17 g by mouth 2 (two) times daily.  . sertraline (ZOLOFT) 100 MG tablet Take 1 tablet (100 mg total) by mouth daily.  . sodium bicarbonate 650 MG tablet Take 1,300 mg by mouth 3 (three) times daily.   . [DISCONTINUED] atorvastatin (LIPITOR) 10 MG tablet Take 1 tablet (10 mg total) by mouth daily.  . [DISCONTINUED] hydrALAZINE (APRESOLINE) 25 MG tablet Take 3 tablets (75 mg total) by mouth every 6 (six) hours. (Patient taking differently: Take 100 mg by  mouth 2 (two) times daily. )  . [DISCONTINUED] Insulin Glargine (LANTUS SOLOSTAR) 100 UNIT/ML Solostar Pen Inject 10 Units into the skin daily at 10 pm.  . [DISCONTINUED] Insulin Glargine (LANTUS) 100 UNIT/ML Solostar Pen Inject 13 Units into the skin daily at 10 pm. (Patient taking differently: Inject 15 Units into the skin at bedtime. )   No facility-administered encounter medications on file as of 11/25/2017.     Allergies (verified) Compazine [prochlorperazine]; Shellfish-derived products; Iodinated diagnostic agents; Omnipaque [iohexol]; and Sulfa antibiotics   History: Past Medical History:  Diagnosis Date  . Anemia   . Anxiety   . Arthritis    "in my joints" (03/10/2017)  . Chronic diastolic CHF (congestive heart failure) ([place])   . CKD (chronic kidney disease), stage IV ([place])    stage IV. previous HD, none currently 10/30/15 (confirmed 02/05/2017 & 03/10/2017)  . Depression    Chronic  . Diabetic peripheral neuropathy ([place])   . Dysrhythmia    tachycardia, normal ECHO 08-09-14  . GERD (gastroesophageal reflux disease)   . Heart murmur     dx'd 02/05/2017  . Hepatitis C    "tx'd in 2016; I'm negative now" (02/05/2017)  . History of blood transfusion 2017   "w/knee replacement"  . Hypertension   . Osteoarthritis of left knee   . Peripheral neuropathy   . Protein calorie malnutrition ([place])   . Septic arthritis ([place])   . Slow transit constipation   . Type II diabetes mellitus ([name])    IDDM  . Uncontrolled hypertension 02/18/2013  . Unsteady gait    Past Surgical History:  Procedure Laterality Date  . AMPUTATION Left 03/20/2017   Procedure: LEFT ABOVE KNEE AMPUTATION;  Surgeon: Newt Minion, MD;  Location: [facility];  Service: Orthopedics;  Laterality: Left;  . APPENDECTOMY    . AV FISTULA PLACEMENT Left 09/13/2014   Procedure: Brachial Artery to Brachial Vein Gortex Four - Seven Stretch GRAFT INSERTION Left Forearm;  Surgeon: Mal Misty, MD;  Location: [facility];  Service:  Vascular;  Laterality: Left;  . BASCILIC VEIN TRANSPOSITION Right 10/26/2017   Procedure: BASILIC VEIN TRANSPOSITION FIRST STAGE RIGHT ARM;  Surgeon: Conrad Harmony, MD;  Location: [facility];  Service: Vascular;  Laterality: Right;  . CHOLECYSTECTOMY OPEN    . COLON SURGERY    . EXCISIONAL TOTAL KNEE ARTHROPLASTY WITH ANTIBIOTIC SPACERS Left 02/06/2017   Procedure: Incisional total iknee with antibiotic spacer ;  Surgeon: Leandrew Koyanagi, MD;  Location: [facility];  Service: Orthopedics;  Laterality: Left;  . IR FLUORO GUIDE CV LINE RIGHT  02/10/2017  . IR REMOVAL TUN CV CATH W/O FL  04/01/2017  . IR US GUIDE VASC ACCESS RIGHT  02/10/2017  . IRRIGATION AND DEBRIDEMENT KNEE Left 03/12/2017   Procedure: IRRIGATION AND DEBRIDEMENT LEFT KNEE WITH WOUND VAC APPLICATION;  Surgeon: Leandrew Koyanagi, MD;  Location: [facility];  Service: Orthopedics;  Laterality: Left;  . JOINT REPLACEMENT    . KNEE ARTHROSCOPY Right 08/10/2014   Procedure: ARTHROSCOPY I & D KNEE;  Surgeon: Marianna Payment, MD;  Location: [facility];  Service: Orthopedics;  Laterality: Right;  . KNEE ARTHROSCOPY Left 08/11/2014   Procedure: ARTHROSCOPIC WASHOUT LEFT KNEE;  Surgeon: Marianna Payment, MD;  Location: [facility];  Service: Orthopedics;  Laterality: Left;  . KNEE ARTHROSCOPY Left 08/19/2014   Procedure: ARTHROSCOPIC WASHOUT LEFT KNEE;  Surgeon: Leandrew Koyanagi, MD;  Location: [facility];  Service: Orthopedics;  Laterality: Left;  . KNEE ARTHROSCOPY WITH LATERAL MENISECTOMY Left 04/04/2015   Procedure: AND PARTIAL LATERAL MENISECTOMY;  Surgeon: Leandrew Koyanagi, MD;  Location: [facility];  Service: Orthopedics;  Laterality: Left;  . KNEE ARTHROSCOPY WITH MEDIAL MENISECTOMY Left 04/04/2015   Procedure: LEFT KNEE ARTHROSCOPY WITH PARTIAL MEDIAL MENISCECTOMY  AND SYNOVECTOMY;  Surgeon: Leandrew Koyanagi, MD;  Location: [facility];  Service: Orthopedics;  Laterality: Left;  . SHOULDER ARTHROSCOPY Bilateral 08/10/2014   Procedure: I & D BILATERAL  SHOULDERS ;  Surgeon: Marianna Payment, MD;  Location: [facility];  Service: Orthopedics;  Laterality: Bilateral;  . SMALL INTESTINE SURGERY     Due to Small Bowel Obstruction; "fixed it when they did my gallbladder OR"  . TEE WITHOUT CARDIOVERSION N/A 08/14/2014   Procedure: TRANSESOPHAGEAL ECHOCARDIOGRAM (TEE);  Surgeon: Thayer Headings, MD;  Location: [facility];  Service: Cardiovascular;  Laterality: N/A;  . TENOSYNOVECTOMY Right 08/11/2014   Procedure: RIGHT WRIST IRRIGATION AND DEBRIDEMENT, TENOSYNOVECTOMY;  Surgeon: Marianna Payment, MD;  Location: [facility];  Service: Orthopedics;  Laterality: Right;  . TOTAL KNEE ARTHROPLASTY Left 11/07/2015   Procedure: LEFT TOTAL KNEE ARTHROPLASTY WITH REVISION OF IMPLANTS;  Surgeon: Leandrew Koyanagi, MD;  Location: [facility];  Service: Orthopedics;  Laterality: Left;  . TUBAL LIGATION     Family History  Problem Relation Age of Onset  . Cancer Mother   . Heart disease Father   . Cancer Sister   . Hypertension Sister   . Cancer Brother   . Diabetes Brother   . Hypertension Brother    Social History   Socioeconomic History  . Marital status: Single    Spouse name: Not on file  . Number of children: Not on file  . Years of education: Not on file  . Highest education level: Not on file  Occupational History  . Not on file  Social Needs  . Financial resource strain: Not on file  . Food insecurity:    Worry: Not on file    Inability: Not on file  . Transportation needs:    Medical: Not on file    Non-medical: Not on file  Tobacco Use  . Smoking status: Current Every Day Smoker    Packs/day: 0.33    Years: 35.00    Pack years: 11.55    Types: Cigarettes  . Smokeless tobacco: Never Used  Substance and Sexual Activity  . Alcohol use: No    Alcohol/week: 0.0 oz    Comment: 03/10/2017 "I drink a wine cooler a few times/year"  . Drug use: No  . Sexual activity: Never  Lifestyle  . Physical activity:    Days per week: Not on file    Minutes  per session: Not on file  . Stress: Not on file  Relationships  . Social connections:    Talks on phone: Not on file    Gets together:  Not on file    Attends religious service: Not on file    Active member of club or organization: Not on file    Attends meetings of clubs or organizations: Not on file    Relationship status: Not on file  Other Topics Concern  . Not on file  Social History Narrative  . Not on file    Tobacco Counseling Ready to quit: Not Answered Counseling given: Not Answered    Activities of Daily Living In your present state of health, do you have any difficulty performing the following activities: 11/25/2017 10/26/2017  Hearing? - N  Vision? N N  Difficulty concentrating or making decisions? N N  Comment - -  Walking or climbing stairs? Y Y  Dressing or bathing? N N  Doing errands, shopping? [place] and eating ? N -  Using the Toilet? N -  In the past six months, have you accidently leaked urine? N -  Do you have problems with loss of bowel control? N -  Managing your Medications? N -  Managing your Finances? N -  Housekeeping or managing your Housekeeping? N -  Some recent data might be hidden     Immunizations and Health Maintenance Immunization History  Administered Date(s) Administered  . Hepatitis B, adult 11/07/2014, 12/12/2014  . Hepatitis B, ped/adol 11/07/2014, 12/12/2014  . Influenza Inj Mdck Quad Pf 08/08/2014  . Influenza, Seasonal, Injecte, Preservative Fre 02/08/2015, 02/13/2016  . Influenza,inj,Quad PF,6+ Mos 08/08/2014, 04/01/2017  . PPD Test 11/13/2015  . Pneumococcal Polysaccharide-23 08/08/2014  . Pneumococcal-Unspecified 08/08/2014  . Tdap 08/25/2016   Health Maintenance Due  Topic Date Due  . OPHTHALMOLOGY EXAM  01/23/2017    Patient Care Team: [name], MD as PCP - General (Family Medicine) Leandrew Koyanagi, MD as Attending Physician (Orthopedic Surgery) Kidney, Vicenta Aly, MD as Consulting Physician (Infectious Diseases) Conrad Berry, MD as Consulting Physician (Vascular Surgery) Newt Minion, MD as Consulting Physician (Orthopedic Surgery) Ardath Sax, MD as Consulting Physician (Hematology and Oncology) Mora Bellman, MD as Consulting Physician (Obstetrics and Gynecology)  Indicate any recent Medical Services you may have received from other than [facility] providers in the past year (date may be approximate).     Assessment:   This is a routine wellness examination for [place].  Hearing/Vision screen Hearing Screening Comments: Able to hear conversational tones w/o difficulty. No issues reported.   Vision Screening Comments: Last exam 01/24/2016. Will make appt. Wears reading glasses.   Dietary issues and exercise activities discussed: Current Exercise Habits: Structured exercise class, Type of exercise: Other - see comments(Rehab/PT), Time (Minutes): 45, Frequency (Times/Week): 2, Weekly Exercise (Minutes/Week): 90, Exercise limited by: orthopedic condition(s)   Diet (meal preparation, eat out, water intake, caffeinated beverages, dairy products, fruits and vegetables): Drinks water, sugar free tea.   Breakfast: eggs, bacon, sausage, toast/biscuit, coffee Lunch: snack Dinner: protein, vegetables, potatoes.   Goals    . Patient Stated     Independently walk without assistance       Depression Screen PHQ 2/9 Scores 11/25/2017 11/25/2017 10/14/2017 08/25/2017 08/13/2017 07/21/2017 09/10/2015  PHQ - 2 Score 0 0 0 0 0 0 0  PHQ- 9 Score - 0 - 0 - - -    Fall Risk Fall Risk  11/25/2017 10/14/2017 08/25/2017 08/13/2017 07/21/2017  Falls in the past year? No No No No No  Number falls in past yr: - - - - -  Injury with Fall? - - - - -    Cognitive Function:       Ad8 score reviewed for issues:  Issues making decisions: no  Less interest in hobbies / activities: no  Repeats questions, stories (family complaining): no  Trouble using ordinary  gadgets (microwave, computer, phone): no  Forgets the month or year: no  Mismanaging finances: no  Remembering appts: no  Daily problems with thinking and/or memory: no Ad8 score is=0     Screening Tests Health Maintenance  Topic Date Due  . OPHTHALMOLOGY EXAM  01/23/2017  . PAP SMEAR  12/14/2017  . INFLUENZA VACCINE  01/14/2018  . HEMOGLOBIN A1C  02/25/2018  . FOOT EXAM  08/16/2018  . MAMMOGRAM  09/16/2018  . PNEUMOCOCCAL POLYSACCHARIDE VACCINE (2) 08/09/2019  . COLONOSCOPY  07/02/2023  . TETANUS/TDAP  08/26/2026  . Hepatitis C Screening  Completed  . HIV Screening  Completed        Plan:    Schedule eye exam.   Continue doing brain stimulating activities (puzzles, reading, adult coloring books, staying active) to keep memory sharp.   I have personally reviewed and noted the following in the patient's chart:   . Medical and social history . Use of alcohol, tobacco or illicit drugs  . Current medications and supplements . Functional ability and status . Nutritional status . Physical activity . Advanced directives . List of other physicians . Hospitalizations, surgeries, and ER visits in previous 12 months . Vitals . Screenings to include cognitive, depression, and falls . Referrals and appointments  In addition, I have reviewed and discussed with patient certain preventive protocols, quality metrics, and best practice recommendations. A written personalized care plan for preventive services as well as general preventive health recommendations were provided to patient.     Gerilyn Nestle, RN   11/25/2017

## 2017-11-25 ENCOUNTER — Other Ambulatory Visit: Payer: Self-pay

## 2017-11-25 ENCOUNTER — Ambulatory Visit (INDEPENDENT_AMBULATORY_CARE_PROVIDER_SITE_OTHER): Payer: Medicare Other

## 2017-11-25 ENCOUNTER — Encounter: Payer: Self-pay | Admitting: Family Medicine

## 2017-11-25 ENCOUNTER — Telehealth: Payer: Self-pay | Admitting: Emergency Medicine

## 2017-11-25 ENCOUNTER — Telehealth: Payer: Self-pay | Admitting: *Deleted

## 2017-11-25 ENCOUNTER — Ambulatory Visit (INDEPENDENT_AMBULATORY_CARE_PROVIDER_SITE_OTHER): Payer: Medicare Other | Admitting: Family Medicine

## 2017-11-25 VITALS — BP 110/62 | HR 70 | Temp 97.8°F | Resp 15 | Ht 67.0 in | Wt 147.8 lb

## 2017-11-25 DIAGNOSIS — Z0000 Encounter for general adult medical examination without abnormal findings: Secondary | ICD-10-CM | POA: Diagnosis not present

## 2017-11-25 DIAGNOSIS — N871 Moderate cervical dysplasia: Secondary | ICD-10-CM

## 2017-11-25 DIAGNOSIS — I10 Essential (primary) hypertension: Secondary | ICD-10-CM

## 2017-11-25 DIAGNOSIS — N185 Chronic kidney disease, stage 5: Secondary | ICD-10-CM | POA: Diagnosis not present

## 2017-11-25 DIAGNOSIS — E1122 Type 2 diabetes mellitus with diabetic chronic kidney disease: Secondary | ICD-10-CM | POA: Diagnosis not present

## 2017-11-25 DIAGNOSIS — I5032 Chronic diastolic (congestive) heart failure: Secondary | ICD-10-CM

## 2017-11-25 LAB — COMPREHENSIVE METABOLIC PANEL
ALT: 15 U/L (ref 0–35)
AST: 11 U/L (ref 0–37)
Albumin: 3.8 g/dL (ref 3.5–5.2)
Alkaline Phosphatase: 118 U/L — ABNORMAL HIGH (ref 39–117)
BUN: 77 mg/dL — ABNORMAL HIGH (ref 6–23)
CO2: 24 mEq/L (ref 19–32)
Calcium: 9 mg/dL (ref 8.4–10.5)
Chloride: 101 mEq/L (ref 96–112)
Creatinine, Ser: 5.2 mg/dL (ref 0.40–1.20)
GFR: 10.74 mL/min — CL (ref >60.00)
Glucose, Bld: 72 mg/dL (ref 70–99)
Potassium: 3.4 mEq/L — ABNORMAL LOW (ref 3.5–5.1)
Sodium: 139 mEq/L (ref 135–145)
Total Bilirubin: 0.4 mg/dL (ref 0.2–1.2)
Total Protein: 7.2 g/dL (ref 6.0–8.3)

## 2017-11-25 LAB — POCT GLYCOSYLATED HEMOGLOBIN (HGB A1C): Hemoglobin A1C: 6.1 % — AB (ref 4.0–5.6)

## 2017-11-25 LAB — LIPID PANEL
Cholesterol: 121 mg/dL (ref 0–200)
HDL: 47.7 mg/dL (ref >39.00)
LDL Cholesterol: 57 mg/dL (ref 0–99)
NonHDL: 73.03
Total CHOL/HDL Ratio: 3
Triglycerides: 79 mg/dL (ref 0.0–149.0)
VLDL: 15.8 mg/dL (ref 0.0–40.0)

## 2017-11-25 MED ORDER — ATORVASTATIN CALCIUM 10 MG PO TABS: 10.0000 mg | ORAL_TABLET | Freq: Every day | ORAL | 3 refills | Status: DC

## 2017-11-25 MED ORDER — INSULIN GLARGINE 100 UNIT/ML SOLOSTAR PEN: 12.0000 [IU] | PEN_INJECTOR | Freq: Every day | SUBCUTANEOUS | 99 refills | Status: DC

## 2017-11-25 MED ORDER — INSULIN GLARGINE 100 UNIT/ML SOLOSTAR PEN: 10.0000 [IU] | PEN_INJECTOR | Freq: Every day | SUBCUTANEOUS | 99 refills | Status: DC

## 2017-11-25 MED ORDER — HYDRALAZINE HCL 25 MG PO TABS: 100.0000 mg | ORAL_TABLET | Freq: Two times a day (BID) | ORAL | Status: DC

## 2017-11-25 NOTE — Telephone Encounter (Signed)
See second phone note from today regarding Nephrology instructions. Patients creatinine is actually lower now that at recent appt with specialist.

## 2017-11-25 NOTE — Telephone Encounter (Signed)
Spoke with Dr. Sanda Klein office. Informed of Creatinine of 5.20, Nurse Informed me that her Creatinine was 5.78. I faxed labs to their office and they will review and reach out to patient   Doloris Hall,  LPN

## 2017-11-25 NOTE — Therapy (Signed)
[facility] [address] [place], [place], [address] Phone: [phone]   Fax:  [phone]  Physical Therapy Treatment  Patient Details  Name: Brandy Houston MRN: [id number] Date of Birth: [birth year] Referring Provider: Meridee Score, MD   Encounter Date: 11/24/2017  PT End of Session - 11/24/17 1759    Visit Number  11    Number of Visits  25    Date for PT Re-Evaluation  01/05/18    Authorization Type  Medicare & Medicaid    PT Start Time  8786    PT Stop Time  1100    PT Time Calculation (min)  45 min    Equipment Utilized During Treatment  Gait belt    Activity Tolerance  Patient tolerated treatment well    Behavior During Therapy  [facility] for tasks assessed/performed       Past Medical History:  Diagnosis Date  . Anemia   . Anxiety   . Arthritis    "in my joints" (03/10/2017)  . Chronic diastolic CHF (congestive heart failure) ([place])   . CKD (chronic kidney disease), stage IV ([place])    stage IV. previous HD, none currently 10/30/15 (confirmed 02/05/2017 & 03/10/2017)  . Depression    Chronic  . Diabetic peripheral neuropathy ([place])   . Dysrhythmia    tachycardia, normal ECHO 08-09-14  . GERD (gastroesophageal reflux disease)   . Heart murmur     dx'd 02/05/2017  . Hepatitis C    "tx'd in 2016; I'm negative now" (02/05/2017)  . History of blood transfusion 2017   "w/knee replacement"  . Hypertension   . Osteoarthritis of left knee   . Peripheral neuropathy   . Protein calorie malnutrition ([place])   . Septic arthritis ([place])   . Slow transit constipation   . Type II diabetes mellitus ([name])    IDDM  . Uncontrolled hypertension 02/18/2013  . Unsteady gait     Past Surgical History:  Procedure Laterality Date  . AMPUTATION Left 03/20/2017   Procedure: LEFT ABOVE KNEE AMPUTATION;  Surgeon: Newt Minion, MD;  Location: [facility];  Service: Orthopedics;  Laterality: Left;  . APPENDECTOMY    . AV FISTULA PLACEMENT Left  09/13/2014   Procedure: Brachial Artery to Brachial Vein Gortex Four - Seven Stretch GRAFT INSERTION Left Forearm;  Surgeon: Mal Misty, MD;  Location: [name];  Service: Vascular;  Laterality: Left;  . BASCILIC VEIN TRANSPOSITION Right 10/26/2017   Procedure: BASILIC VEIN TRANSPOSITION FIRST STAGE RIGHT ARM;  Surgeon: Conrad F[place] Hill, MD;  Location: [facility];  Service: Vascular;  Laterality: Right;  . CHOLECYSTECTOMY OPEN    . COLON SURGERY    . EXCISIONAL TOTAL KNEE ARTHROPLASTY WITH ANTIBIOTIC SPACERS Left 02/06/2017   Procedure: Incisional total iknee with antibiotic spacer ;  Surgeon: Leandrew Koyanagi, MD;  Location: [facility];  Service: Orthopedics;  Laterality: Left;  . IR FLUORO GUIDE CV LINE RIGHT  02/10/2017  . IR REMOVAL TUN CV CATH W/O FL  04/01/2017  . IR US GUIDE VASC ACCESS RIGHT  02/10/2017  . IRRIGATION AND DEBRIDEMENT KNEE Left 03/12/2017   Procedure: IRRIGATION AND DEBRIDEMENT LEFT KNEE WITH WOUND VAC APPLICATION;  Surgeon: Leandrew Koyanagi, MD;  Location: [facility];  Service: Orthopedics;  Laterality: Left;  . JOINT REPLACEMENT    . KNEE ARTHROSCOPY Right 08/10/2014   Procedure: ARTHROSCOPY I & D KNEE;  Surgeon: Marianna Payment, MD;  Location: [facility];  Service: Orthopedics;  Laterality:  Right;  [name] KNEE ARTHROSCOPY Left 08/11/2014   Procedure: ARTHROSCOPIC WASHOUT LEFT KNEE;  Surgeon: Marianna Payment, MD;  Location: [facility];  Service: Orthopedics;  Laterality: Left;  . KNEE ARTHROSCOPY Left 08/19/2014   Procedure: ARTHROSCOPIC WASHOUT LEFT KNEE;  Surgeon: Leandrew Koyanagi, MD;  Location: [facility];  Service: Orthopedics;  Laterality: Left;  . KNEE ARTHROSCOPY WITH LATERAL MENISECTOMY Left 04/04/2015   Procedure: AND PARTIAL LATERAL MENISECTOMY;  Surgeon: Leandrew Koyanagi, MD;  Location: [facility];  Service: Orthopedics;  Laterality: Left;  . KNEE ARTHROSCOPY WITH MEDIAL MENISECTOMY Left 04/04/2015   Procedure: LEFT KNEE ARTHROSCOPY WITH PARTIAL MEDIAL MENISCECTOMY  AND SYNOVECTOMY;  Surgeon:  Leandrew Koyanagi, MD;  Location: [facility];  Service: Orthopedics;  Laterality: Left;  . SHOULDER ARTHROSCOPY Bilateral 08/10/2014   Procedure: I & D BILATERAL SHOULDERS ;  Surgeon: Marianna Payment, MD;  Location: [facility];  Service: Orthopedics;  Laterality: Bilateral;  . SMALL INTESTINE SURGERY     Due to Small Bowel Obstruction; "fixed it when they did my gallbladder OR"  . TEE WITHOUT CARDIOVERSION N/A 08/14/2014   Procedure: TRANSESOPHAGEAL ECHOCARDIOGRAM (TEE);  Surgeon: Thayer Headings, MD;  Location: [facility];  Service: Cardiovascular;  Laterality: N/A;  . TENOSYNOVECTOMY Right 08/11/2014   Procedure: RIGHT WRIST IRRIGATION AND DEBRIDEMENT, TENOSYNOVECTOMY;  Surgeon: Marianna Payment, MD;  Location: [facility];  Service: Orthopedics;  Laterality: Right;  . TOTAL KNEE ARTHROPLASTY Left 11/07/2015   Procedure: LEFT TOTAL KNEE ARTHROPLASTY WITH REVISION OF IMPLANTS;  Surgeon: Leandrew Koyanagi, MD;  Location: [facility];  Service: Orthopedics;  Laterality: Left;  . TUBAL LIGATION      There were no vitals filed for this visit.  Subjective Assessment - 11/24/17 1015    Subjective  She has been walking in house with walker. She is wearing prosthesis 4-5 hrs total /day over 3 times.     Pertinent History  left TFA, DM, neuropathy, Hep C, HTN, CHF, depression, CKD st 5 not dialysis, arthritis, heart murmur,     Limitations  Standing;Walking;House hold activities    Patient Stated Goals  To walk with prosthesis in home & community, travel plane, car & cruise in September    Currently in Pain?  No/denies                       [facility] Adult PT Treatment/Exercise - 11/24/17 1015      Transfers   Transfers  Sit to Stand;Stand to Sit    Sit to Stand  5: Supervision;With upper extremity assist;With armrests;From chair/3-in-1;4: Min guard to RW, chairs with & without armrests    Sit to Stand Details  Verbal cues for sequencing;Verbal cues for technique;Verbal cues for  precautions/safety;Verbal cues for safe use of DME/AE    Stand to Sit  5: Supervision;With upper extremity assist;With armrests;To chair/3-in-1 from RW, chairs with & without armrests    Stand to Sit Details (indicate cue type and reason)  Verbal cues for precautions/safety;Verbal cues for safe use of DME/AE      Ambulation/Gait   Ambulation/Gait  Yes    Ambulation/Gait Assistance  5: Supervision    Ambulation/Gait Assistance Details  verbal cues on posture, step length & wt shift over prosthesis in stance    Ambulation Distance (Feet)  100 Feet 100' X 3    Assistive device  Rolling walker;Prosthesis    Gait Pattern  Step-through pattern;Decreased stance time - left;Decreased step length -  right;Decreased hip/knee flexion - left;Decreased weight shift to left;Antalgic;Lateral hip instability;Trunk flexed;Abducted - left    Ramp  5: Supervision Min guard intermittent, RW & TFA prosthesis    Ramp Details (indicate cue type and reason)  verbal cues on technique    Curb  5: Supervision RW & prosthesis    Curb Details (indicate cue type and reason)  verbal cues on technique / sequence      Self-Care   Self-Care  ADL's    ADL's  PT demo, instructed in standing & using counter in kitchen including reaching into overhead & lower cabinets and moving heavy &/or hot pots along counter with folded towel or pot holder.  Pt return demo understanding and verbalizes understanding how standing with prosthesis vs. w/c without prosthesis will improve function, safety and energy expenditure      Prosthetics   Prosthetic Care Comments   adjusting ply socks & folding over upper socket.  Set-up appointment with prosthetist to 1)add pads to socket  2)roll flexible liner near pubic ramus  & 3)dynamic alignment check for adjustments    Current prosthetic wear tolerance (days/week)   daily    Current prosthetic wear tolerance (#hours/day)   5hrs total between 3 wears. PT instructed 4hrs 2x/day.     Residual limb  condition   intact per pt report.    Education Provided  Residual limb care;Proper wear schedule/adjustment;Proper weight-bearing schedule/adjustment;Correct ply sock adjustment;Proper Donning;Other (comment) see prosthetic care comments    Person(s) Educated  Patient    Education Method  Explanation;Verbal cues;Demonstration;Tactile cues    Education Method  Verbalized understanding;Returned demonstration;Verbal cues required;Needs further instruction    Donning Prosthesis  Supervision               PT Short Term Goals - 11/05/17 1628      PT SHORT TERM GOAL #1   Title  Patient verbalizes initial understanding of need to adjust ply socks with limb volume changes. (All STGs Target Date: 12/04/2017)    Time  4    Period  Weeks    Status  New    Target Date  12/04/17      PT SHORT TERM GOAL #2   Title  Patient tolerates prosthesis wear >10hrs total /day without skin issues.     Time  4    Period  Weeks    Status  New    Target Date  12/04/17      PT SHORT TERM GOAL #3   Title  Patient standing balance without UE support stands 2 minutes and reaches 2" anteriorly with supervision.     Time  4    Period  Weeks    Status  New    Target Date  12/04/17      PT SHORT TERM GOAL #4   Title  Patient ambulates 150' outdoors on paved surfaces with RW & prosthesis with supervision.     Time  4    Period  Weeks    Status  New    Target Date  12/04/17      PT SHORT TERM GOAL #5   Title  Patient negotiates stairs with 1 rail/cane, ramp & curb with RW & prosthesis with supervision.     Time  4    Period  Weeks    Status  New    Target Date  12/04/17        PT Long Term Goals - 10/22/17 1447      PT LONG  TERM GOAL #1   Title  Patient verbalizes & demonstrates proper prosthetic care to enable safe use of prosthesis. (All LTGs Target Date: 01/01/2018)    Time  12    Period  Weeks    Status  On-going    Target Date  01/01/18      PT LONG TERM GOAL #2   Title  Patient  tolerates prosthesis wear >80% of awake hours without skin issues or limb pain to enable function throughout her day.     Time  12    Period  Weeks    Status  On-going    Target Date  01/01/18      PT LONG TERM GOAL #3   Title  Patient ambulates 500' outdoors including grass with LRAD & prosthesis modified independent for community mobility.     Time  12    Period  Weeks    Status  On-going    Target Date  01/01/18      PT LONG TERM GOAL #4   Title  Patient negotiates ramps, curbs & stairs with LRAD & prosthesis modified independent to enable community mobility.     Time  12    Period  Weeks    Status  On-going    Target Date  01/01/18      PT LONG TERM GOAL #5   Title  Berg Balance Test >/= 30/56    Time  12    Period  Weeks    Status  On-going    Target Date  01/01/18            Plan - 11/24/17 1800    Clinical Impression Statement  Patient seems to limit prosthesis wear at home reporting "it gets in my way" Wearing TFA prosthesis & moving in w/c is akward & difficult. PT is intructing in using prosthesis with RW & kitchen for cooking standing for ADLs to initiate using prosthesis for function in home in place of w/c. Pt verbalized better understanding after today's session.     Rehab Potential  Good    Clinical Impairments Affecting Rehab Potential  transfemoral level amputation with multiple medical issues, significantly limited activity level for >2 years    PT Frequency  2x / week    PT Duration  12 weeks    PT Treatment/Interventions  ADLs/Self Care Home Management;Moist Heat;DME Instruction;Gait training;Stair training;Functional mobility training;Therapeutic activities;Therapeutic exercise;Balance training;Neuromuscular re-education;Patient/family education;Prosthetic Training;Manual techniques;Vestibular    PT Next Visit Plan  prosthetic gait with RW including ramps, curbs & stairs, work on turns 90*, 180* & 360*, balance reaching & picking up objects    Consulted  and Agree with Plan of Care  Patient       Patient will benefit from skilled therapeutic intervention in order to improve the following deficits and impairments:  Abnormal gait, Decreased activity tolerance, Decreased balance, Decreased endurance, Decreased knowledge of use of DME, Decreased mobility, Decreased strength, Decreased range of motion, Impaired flexibility, Prosthetic Dependency, Postural dysfunction  Visit Diagnosis: Other abnormalities of gait and mobility  Muscle weakness (generalized)  Unsteadiness on feet     Problem List Patient Active Problem List   Diagnosis Date Noted  . Benign essential HTN   . Stage 5 chronic kidney disease not on chronic dialysis ([place])   . Above knee amputation status, left ([place])   . DM type 2 causing CKD stage 5 ([place]) 03/21/2017  . Anemia of chronic disease 02/05/2017  . Dysplasia of cervix, high grade CIN 2  01/12/2017  . Chronic diastolic CHF (congestive heart failure) ([place]) 11/08/2015  . Total knee replacement status   . Diabetic peripheral neuropathy ([place])   . Major depression, recurrent, chronic ([place])   . Chronic kidney disease (CKD), stage IV (severe) ([place])   . Hepatitis C 08/16/2014  . Peripheral neuropathy 06/24/2013  . Compulsive tobacco user syndrome 06/24/2013    Jamey Reas PT, DPT 11/25/2017, 10:14 AM  [facility] [address] [place], [place], [address] Phone: [phone]   Fax:  [phone]  Name: Brandy Houston MRN: [id number] Date of Birth: [birth year]

## 2017-11-25 NOTE — Patient Instructions (Addendum)
Schedule eye exam.   Continue doing brain stimulating activities (puzzles, reading, adult coloring books, staying active) to keep memory sharp.   Health Maintenance, Female Adopting a healthy lifestyle and getting preventive care can go a long way to promote health and wellness. Talk with your health care provider about what schedule of regular examinations is right for you. This is a good chance for you to check in with your provider about disease prevention and staying healthy. In between checkups, there are plenty of things you can do on your own. Experts have done a lot of research about which lifestyle changes and preventive measures are most likely to keep you healthy. Ask your health care provider for more information. Weight and diet Eat a healthy diet  Be sure to include plenty of vegetables, fruits, low-fat dairy products, and lean protein.  Do not eat a lot of foods high in solid fats, added sugars, or salt.  Get regular exercise. This is one of the most important things you can do for your health. ? Most adults should exercise for at least 150 minutes each week. The exercise should increase your heart rate and make you sweat (moderate-intensity exercise). ? Most adults should also do strengthening exercises at least twice a week. This is in addition to the moderate-intensity exercise.  Maintain a healthy weight  Body mass index (BMI) is a measurement that can be used to identify possible weight problems. It estimates body fat based on height and weight. Your health care provider can help determine your BMI and help you achieve or maintain a healthy weight.  For females 105 years of age and older: ? A BMI below 18.5 is considered underweight. ? A BMI of 18.5 to 24.9 is normal. ? A BMI of 25 to 29.9 is considered overweight. ? A BMI of 30 and above is considered obese.  Watch levels of cholesterol and blood lipids  You should start having your blood tested for lipids and  cholesterol at 63 years of age, then have this test every 5 years.  You may need to have your cholesterol levels checked more often if: ? Your lipid or cholesterol levels are high. ? You are older than 63 years of age. ? You are at high risk for heart disease.  Cancer screening Lung Cancer  Lung cancer screening is recommended for adults 49-65 years old who are at high risk for lung cancer because of a history of smoking.  A yearly low-dose CT scan of the lungs is recommended for people who: ? Currently smoke. ? Have quit within the past 15 years. ? Have at least a 30-pack-year history of smoking. A pack year is smoking an average of one pack of cigarettes a day for 1 year.  Yearly screening should continue until it has been 15 years since you quit.  Yearly screening should stop if you develop a health problem that would prevent you from having lung cancer treatment.  Breast Cancer  Practice breast self-awareness. This means understanding how your breasts normally appear and feel.  It also means doing regular breast self-exams. Let your health care provider know about any changes, no matter how small.  If you are in your 20s or 30s, you should have a clinical breast exam (CBE) by a health care provider every 1-3 years as part of a regular health exam.  If you are 53 or older, have a CBE every year. Also consider having a breast X-ray (mammogram) every year.  If you  have a family history of breast cancer, talk to your health care provider about genetic screening.  If you are at high risk for breast cancer, talk to your health care provider about having an MRI and a mammogram every year.  Breast cancer gene (BRCA) assessment is recommended for women who have family members with BRCA-related cancers. BRCA-related cancers include: ? Breast. ? Ovarian. ? Tubal. ? Peritoneal cancers.  Results of the assessment will determine the need for genetic counseling and BRCA1 and BRCA2  testing.  Cervical Cancer Your health care provider may recommend that you be screened regularly for cancer of the pelvic organs (ovaries, uterus, and vagina). This screening involves a pelvic examination, including checking for microscopic changes to the surface of your cervix (Pap test). You may be encouraged to have this screening done every 3 years, beginning at age 43.  For women ages 21-65, health care providers may recommend pelvic exams and Pap testing every 3 years, or they may recommend the Pap and pelvic exam, combined with testing for human papilloma virus (HPV), every 5 years. Some types of HPV increase your risk of cervical cancer. Testing for HPV may also be done on women of any age with unclear Pap test results.  Other health care providers may not recommend any screening for nonpregnant women who are considered low risk for pelvic cancer and who do not have symptoms. Ask your health care provider if a screening pelvic exam is right for you.  If you have had past treatment for cervical cancer or a condition that could lead to cancer, you need Pap tests and screening for cancer for at least 20 years after your treatment. If Pap tests have been discontinued, your risk factors (such as having a new sexual partner) need to be reassessed to determine if screening should resume. Some women have medical problems that increase the chance of getting cervical cancer. In these cases, your health care provider may recommend more frequent screening and Pap tests.  Colorectal Cancer  This type of cancer can be detected and often prevented.  Routine colorectal cancer screening usually begins at 63 years of age and continues through 63 years of age.  Your health care provider may recommend screening at an earlier age if you have risk factors for colon cancer.  Your health care provider may also recommend using home test kits to check for hidden blood in the stool.  A small camera at the end of a  tube can be used to examine your colon directly (sigmoidoscopy or colonoscopy). This is done to check for the earliest forms of colorectal cancer.  Routine screening usually begins at age 48.  Direct examination of the colon should be repeated every 5-10 years through 63 years of age. However, you may need to be screened more often if early forms of precancerous polyps or small growths are found.  Skin Cancer  Check your skin from head to toe regularly.  Tell your health care provider about any new moles or changes in moles, especially if there is a change in a mole's shape or color.  Also tell your health care provider if you have a mole that is larger than the size of a pencil eraser.  Always use sunscreen. Apply sunscreen liberally and repeatedly throughout the day.  Protect yourself by wearing long sleeves, pants, a wide-brimmed hat, and sunglasses whenever you are outside.  Heart disease, diabetes, and high blood pressure  High blood pressure causes heart disease and increases the  risk of stroke. High blood pressure is more likely to develop in: ? People who have blood pressure in the high end of the normal range (130-139/85-89 mm Hg). ? People who are overweight or obese. ? People who are African American.  If you are 79-19 years of age, have your blood pressure checked every 3-5 years. If you are 32 years of age or older, have your blood pressure checked every year. You should have your blood pressure measured twice-once when you are at a hospital or clinic, and once when you are not at a hospital or clinic. Record the average of the two measurements. To check your blood pressure when you are not at a hospital or clinic, you can use: ? An automated blood pressure machine at a pharmacy. ? A home blood pressure monitor.  If you are between 41 years and 98 years old, ask your health care provider if you should take aspirin to prevent strokes.  Have regular diabetes screenings. This  involves taking a blood sample to check your fasting blood sugar level. ? If you are at a normal weight and have a low risk for diabetes, have this test once every three years after 63 years of age. ? If you are overweight and have a high risk for diabetes, consider being tested at a younger age or more often. Preventing infection Hepatitis B  If you have a higher risk for hepatitis B, you should be screened for this virus. You are considered at high risk for hepatitis B if: ? You were born in a country where hepatitis B is common. Ask your health care provider which countries are considered high risk. ? Your parents were born in a high-risk country, and you have not been immunized against hepatitis B (hepatitis B vaccine). ? You have HIV or AIDS. ? You use needles to inject street drugs. ? You live with someone who has hepatitis B. ? You have had sex with someone who has hepatitis B. ? You get hemodialysis treatment. ? You take certain medicines for conditions, including cancer, organ transplantation, and autoimmune conditions.  Hepatitis C  Blood testing is recommended for: ? Everyone born from [birth year] through 1966. ? Anyone with known risk factors for hepatitis C.  Sexually transmitted infections (STIs)  You should be screened for sexually transmitted infections (STIs) including gonorrhea and chlamydia if: ? You are sexually active and are younger than 63 years of age. ? You are older than 63 years of age and your health care provider tells you that you are at risk for this type of infection. ? Your sexual activity has changed since you were last screened and you are at an increased risk for chlamydia or gonorrhea. Ask your health care provider if you are at risk.  If you do not have HIV, but are at risk, it may be recommended that you take a prescription medicine daily to prevent HIV infection. This is called pre-exposure prophylaxis (PrEP). You are considered at risk if: ? You are  sexually active and do not regularly use condoms or know the HIV status of your partner(s). ? You take drugs by injection. ? You are sexually active with a partner who has HIV.  Talk with your health care provider about whether you are at high risk of being infected with HIV. If you choose to begin PrEP, you should first be tested for HIV. You should then be tested every 3 months for as long as you are taking PrEP. Pregnancy  If you are premenopausal and you may become pregnant, ask your health care provider about preconception counseling.  If you may become pregnant, take 400 to 800 micrograms (mcg) of folic acid every day.  If you want to prevent pregnancy, talk to your health care provider about birth control (contraception). Osteoporosis and menopause  Osteoporosis is a disease in which the bones lose minerals and strength with aging. This can result in serious bone fractures. Your risk for osteoporosis can be identified using a bone density scan.  If you are 8 years of age or older, or if you are at risk for osteoporosis and fractures, ask your health care provider if you should be screened.  Ask your health care provider whether you should take a calcium or vitamin D supplement to lower your risk for osteoporosis.  Menopause may have certain physical symptoms and risks.  Hormone replacement therapy may reduce some of these symptoms and risks. Talk to your health care provider about whether hormone replacement therapy is right for you. Follow these instructions at home:  Schedule regular health, dental, and eye exams.  Stay current with your immunizations.  Do not use any tobacco products including cigarettes, chewing tobacco, or electronic cigarettes.  If you are pregnant, do not drink alcohol.  If you are breastfeeding, limit how much and how often you drink alcohol.  Limit alcohol intake to no more than 1 drink per day for nonpregnant women. One drink equals 12 ounces of  beer, 5 ounces of wine, or 1 ounces of hard liquor.  Do not use street drugs.  Do not share needles.  Ask your health care provider for help if you need support or information about quitting drugs.  Tell your health care provider if you often feel depressed.  Tell your health care provider if you have ever been abused or do not feel safe at home. This information is not intended to replace advice given to you by your health care provider. Make sure you discuss any questions you have with your health care provider. Document Released: 12/16/2010 Document Revised: 11/08/2015 Document Reviewed: 03/06/2015 Elsevier Interactive Patient Education  [employer].

## 2017-11-25 NOTE — Telephone Encounter (Signed)
CRITICAL VALUE STICKER  CRITICAL VALUE: Creatinine 5.20, GFR 10.74  RECEIVER (on-site recipient of call): Ko Vaya NOTIFIED: 11/25/17 3:05pm  MESSENGER (representative from lab): Alan Ripper  MD NOTIFIED: Hassell Done  (in Dr. Tamela Oddi absence)  TIME OF NOTIFICATION: 3:15pm

## 2017-11-25 NOTE — Progress Notes (Signed)
I have reviewed the documentation from the recent AWV done by Kim Broome; I agree with the documentation and will follow up on any recommendations or abnormal findings as suggested.  

## 2017-11-25 NOTE — Patient Instructions (Addendum)
Please schedule a follow up appointment with me in 6 months for a recheck.   Please schedule a follow up appointment with [facility] for you Pap & Schedule your eye exam   Decrease insulin to 12 units nightly.     If you have any questions or concerns, please don't hesitate to send me a message via MyChart or call the office at [phone]. Thank you for visiting with [place] today! It's our pleasure caring for you.

## 2017-11-25 NOTE — Telephone Encounter (Signed)
I attempted to contact patient but I had to leave a message. I spoke with [name] she states that the patient has not started dialysis yet. I will reach out to nephrologist to see if they can see her today.

## 2017-11-25 NOTE — Telephone Encounter (Signed)
Reviewing in PCP absence. Creatinine at 5.2 up from 3.7 at last check.  I see that she had a permanent access placed in May. Has she started dialysis? If not, we need to contact her Nephrologist to see if they can accommodate ASAP or if we need to send her to ER.

## 2017-11-25 NOTE — Progress Notes (Addendum)
Subjective  CC:  Chief Complaint  Patient presents with  . Diabetes    Doing well    HPI: Brandy Houston is a 62 y.o. female who presents to the office today for follow up of diabetes and problems listed above in the chief complaint.   Diabetes follow up: Her diabetic control is reported as Unchanged.  She has been taking her Lantus variably, most days 15 units at night but she has been decreasing that dose based on her fasting sugars.  She thought I told her to do this, however I think another doctor must have.  At any rate she has had some lows into the 70s.  Most fasting sugars range between 100-1 20.  She feels well She denies exertional CP or SOB or symptomatic hypoglycemia. She denies foot sores or paresthesias on the right.  She is due for an eye exam.  Last lipid panel was normal a year ago.  She is never been on a statin.  Chronic kidney disease: Reviewed notes from Dr. Lorrene Houston.  Renal function is worsening.  She is status post fistula placement.  She does have lower extremity edema.  Diuretics have been adjusted.   Hypertension follow-up: Fairly well controlled, no chest pain or shortness of breath.  Heart failure is stable although she has lower extremity edema.  History of high-grade CIN-2 lesion due for follow-up Pap next month.  Patient needs to schedule an appointment.  Health maintenance: Due for annual wellness visit.  Due Shingrix  Assessment  1. 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)   2. Benign essential HTN   3. Dysplasia of cervix, high grade CIN 2   4. Chronic diastolic CHF (congestive heart failure) ([place])      Plan   Diabetes is currently very well controlled. Again discussed need to have stable Lantus dosing and want to avoid hypoglycemia.  Will decrease Lantus to 12 units nightly.  She will let me know if her sugars are running low.  Recheck in 6 months.  Recommend eye exam.  Patient to schedule.  Add statin.  Education given.  Blood pressure is well  controlled.  Managed per renal and cards.  CHF is compensated.  Carefully titrating diuretics due to worsening renal function.  Patient to schedule follow-up Pap with [facility] clinic.  Annual wellness visit today with Brandy Houston.  Follow up: 6 months to follow-up on diabetes and chronic medical problems.   Orders Placed This Encounter  Procedures  . Lipid panel  . Comprehensive metabolic panel  . POCT glycosylated hemoglobin (Hb A1C)   Meds ordered this encounter  Medications  . hydrALAZINE (APRESOLINE) 25 MG tablet    Sig: Take 4 tablets (100 mg total) by mouth 2 (two) times daily.  [name] DISCONTD: atorvastatin (LIPITOR) 10 MG tablet    Sig: Take 1 tablet (10 mg total) by mouth daily.    Dispense:  90 tablet    Refill:  3  . DISCONTD: atorvastatin (LIPITOR) 10 MG tablet    Sig: Take 1 tablet (10 mg total) by mouth daily.    Dispense:  90 tablet    Refill:  3  . DISCONTD: atorvastatin (LIPITOR) 10 MG tablet    Sig: Take 1 tablet (10 mg total) by mouth daily.    Dispense:  90 tablet    Refill:  3  . DISCONTD: Insulin Glargine (LANTUS SOLOSTAR) 100 UNIT/ML Solostar Pen    Sig: Inject 10 Units into the skin daily at 10 pm.  Dispense:  5 pen    Refill:  PRN  . DISCONTD: atorvastatin (LIPITOR) 10 MG tablet    Sig: Take 1 tablet (10 mg total) by mouth at bedtime.    Dispense:  90 tablet    Refill:  3  . Insulin Glargine (LANTUS SOLOSTAR) 100 UNIT/ML Solostar Pen    Sig: Inject 12 Units into the skin daily at 10 pm.    Dispense:  5 pen    Refill:  PRN  . atorvastatin (LIPITOR) 10 MG tablet    Sig: Take 1 tablet (10 mg total) by mouth at bedtime.    Dispense:  90 tablet    Refill:  3      Immunization History  Administered Date(s) Administered  . Hepatitis B, adult 11/07/2014, 12/12/2014  . Hepatitis B, ped/adol 11/07/2014, 12/12/2014  . Influenza Inj Mdck Quad Pf 08/08/2014  . Influenza, Seasonal, Injecte, Preservative Fre 02/08/2015, 02/13/2016  . Influenza,inj,Quad PF,6+ Mos  08/08/2014, 04/01/2017  . PPD Test 11/13/2015  . Pneumococcal Polysaccharide-23 08/08/2014  . Pneumococcal-Unspecified 08/08/2014  . Tdap 08/25/2016    Diabetes Related Lab Review: Lab Results  Component Value Date   HGBA1C 6.1 (A) 11/25/2017   HGBA1C 5.2 08/25/2017   HGBA1C 5.3 06/26/2017    Lab Results  Component Value Date   MICROALBUR 266.7 (H) 11/14/2014   Lab Results  Component Value Date   CREATININE 5.20 (HH) 11/25/2017   BUN 77 (H) 11/25/2017   NA 139 11/25/2017   K 3.4 (L) 11/25/2017   CL 101 11/25/2017   CO2 24 11/25/2017   Lab Results  Component Value Date   CHOL 121 11/25/2017   CHOL 97 11/08/2015   CHOL 163 02/21/2013   Lab Results  Component Value Date   HDL 47.70 11/25/2017   HDL 29 (L) 11/08/2015   HDL 73 02/21/2013   Lab Results  Component Value Date   LDLCALC 57 11/25/2017   LDLCALC 51 11/08/2015   LDLCALC 60 02/21/2013   Lab Results  Component Value Date   TRIG 79.0 11/25/2017   TRIG 83 11/08/2015   TRIG 150 (H) 02/21/2013   Lab Results  Component Value Date   CHOLHDL 3 11/25/2017   CHOLHDL 3.3 11/08/2015   CHOLHDL 2.2 02/21/2013   No results found for: LDLDIRECT The ASCVD Risk score Mikey Bussing DC Jr., et al., 2013) failed to calculate for the following reasons:   The valid total cholesterol range is 130 to 320 mg/dL I have reviewed the PMH, Fam and Soc history. Patient Active Problem List   Diagnosis Date Noted  . Benign essential HTN   . Stage 5 chronic kidney disease not on chronic dialysis ([place])   . Above knee amputation status, left ([place])   . 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 Anemia of chronic disease 02/05/2017  . Dysplasia of cervix, high grade CIN 2 01/12/2017    ASCUS + HPV CIN2-3 on colpo S/p LEEP on 7/30   . Chronic diastolic CHF (congestive heart failure) ([place]) 11/08/2015  . Total knee replacement status   . Diabetic peripheral neuropathy ([place])   . Major depression, recurrent, chronic ([place])   .  Chronic kidney disease (CKD), stage IV (severe) ([place])   . Hepatitis C 08/16/2014  . Peripheral neuropathy 06/24/2013    Overview:  Noted to be idiopathic in PCP notes   . Compulsive tobacco user syndrome 06/24/2013    Social History: Patient  reports that she has been smoking cigarettes.  She  has a 11.55 pack-year smoking history. She has never used smokeless tobacco. She reports that she does not drink alcohol or use drugs.  Review of Systems: Ophthalmic: negative for eye pain, loss of vision or double vision Cardiovascular: negative for chest pain Respiratory: negative for SOB or persistent cough Gastrointestinal: negative for abdominal pain Genitourinary: negative for dysuria or gross hematuria MSK: negative for foot lesions Neurologic: negative for weakness or gait disturbance  Objective  Vitals: BP 110/62   Pulse 70   Temp 97.8 F (36.6 C) (Oral)   Resp 15   Ht 5\' 7"  (1.702 m) Comment: Patient reported  Wt 147 lb 12.8 oz (67 kg)   LMP 03/16/2002   SpO2 98%   BMI 23.15 kg/m  General: well appearing, no acute distress  Psych:  Alert and oriented, normal mood and affect HEENT:  Normocephalic, atraumatic, moist mucous membranes, supple neck  Cardiovascular:  Nl S1 and S2, RRR with 2/6 systolic murmur, no gallop or rub.  +1 edema on right edema Respiratory:  Good breath sounds bilaterally, CTAB with normal effort, no rales Skin:  Warm, no rashes Foot exam: no erythema, pallor, or cyanosis visible nl proprioception and sensation to monofilament testing right, +2 distal pulses right    Diabetic education: ongoing education regarding chronic disease management for diabetes was given today. We continue to reinforce the ABC's of diabetic management: A1c (<7 or 8 dependent upon patient), tight blood pressure control, and cholesterol management with goal LDL < 100 minimally. We discuss diet strategies, exercise recommendations, medication options and possible side effects. At each  visit, we review recommended immunizations and preventive care recommendations for diabetics and stress that good diabetic control can prevent other problems. See below for this patient's data.    Commons side effects, risks, benefits, and alternatives for medications and treatment plan prescribed today were discussed, and the patient expressed understanding of the given instructions. Patient is instructed to call or message via MyChart if he/she has any questions or concerns regarding our treatment plan. No barriers to understanding were identified. We discussed Red Flag symptoms and signs in detail. Patient expressed understanding regarding what to do in case of urgent or emergency type symptoms.   Medication list was reconciled, printed and provided to the patient in AVS. Patient instructions and summary information was reviewed with the patient as documented in the AVS. This note was prepared with assistance of Dragon voice recognition software. Occasional wrong-word or sound-a-like substitutions may have occurred due to the inherent limitations of voice recognition software

## 2017-11-25 NOTE — Telephone Encounter (Signed)
Duplicate phone note. Will copy and paste into prior phone note.   Thank you for working on this.  Since Cr improved from check with Nephrology and they are setting up dialysis, will defer to them.

## 2017-11-26 ENCOUNTER — Encounter: Payer: Self-pay | Admitting: Physical Therapy

## 2017-11-26 ENCOUNTER — Ambulatory Visit: Payer: Medicare Other | Admitting: Physical Therapy

## 2017-11-26 DIAGNOSIS — M6281 Muscle weakness (generalized): Secondary | ICD-10-CM

## 2017-11-26 DIAGNOSIS — R2681 Unsteadiness on feet: Secondary | ICD-10-CM

## 2017-11-26 DIAGNOSIS — R2689 Other abnormalities of gait and mobility: Secondary | ICD-10-CM | POA: Diagnosis not present

## 2017-11-26 DIAGNOSIS — M6249 Contracture of muscle, multiple sites: Secondary | ICD-10-CM | POA: Diagnosis not present

## 2017-11-26 NOTE — Therapy (Signed)
[facility]andy [facility] [address] [place], [place], [address] Phone: [phone]   Fax:  [phone]  Physical Therapy Treatment  Patient Details  Name: Brandy [facility] MRN: [id number] Date of Birth: [birth year] Referring Provider: Meridee Score, MD   Encounter Date: 11/26/2017  PT End of Session - 11/26/17 1100    Visit Number  12    Number of Visits  25    Date for PT Re-Evaluation  01/05/18    Authorization Type  Medicare & Medicaid    PT Start Time  1017    PT Stop Time  1058    PT Time Calculation (min)  41 min    Equipment Utilized During Treatment  Gait belt    Activity Tolerance  Patient tolerated treatment well    Behavior During Therapy  WFL for tasks assessed/performed       Past Medical History:  Diagnosis Date  . Anemia   . Anxiety   . Arthritis    "in my joints" (03/10/2017)  . Chronic diastolic CHF (congestive heart failure) ([place])   . CKD (chronic kidney disease), stage IV ([place])    stage IV. previous HD, none currently 10/30/15 (confirmed 02/05/2017 & 03/10/2017)  . Depression    Chronic  . Diabetic peripheral neuropathy ([place])   . Dysrhythmia    tachycardia, normal ECHO 08-09-14  . GERD (gastroesophageal reflux disease)   . Heart murmur     dx'd 02/05/2017  . Hepatitis C    "tx'd in 2016; I'm negative now" (02/05/2017)  . History of blood transfusion 2017   "w/knee replacement"  . Hypertension   . Osteoarthritis of left knee   . Peripheral neuropathy   . Protein calorie malnutrition ([place])   . Septic arthritis ([place])   . Slow transit constipation   . Type II diabetes mellitus ([name])    IDDM  . Uncontrolled hypertension 02/18/2013  . Unsteady gait     Past Surgical History:  Procedure Laterality Date  . AMPUTATION Left 03/20/2017   Procedure: LEFT ABOVE KNEE AMPUTATION;  Surgeon: Newt Minion, MD;  Location: [facility];  Service: Orthopedics;  Laterality: Left;  . APPENDECTOMY    . AV FISTULA PLACEMENT Left  09/13/2014   Procedure: Brachial Artery to Brachial Vein Gortex Four - Seven Stretch GRAFT INSERTION Left Forearm;  Surgeon: Mal Misty, MD;  Location: [facility];  Service: Vascular;  Laterality: Left;  . BASCILIC VEIN TRANSPOSITION Right 10/26/2017   Procedure: BASILIC VEIN TRANSPOSITION FIRST STAGE RIGHT ARM;  Surgeon: Conrad Crown, MD;  Location: [facility];  Service: Vascular;  Laterality: Right;  . CHOLECYSTECTOMY OPEN    . COLON SURGERY    . EXCISIONAL TOTAL KNEE ARTHROPLASTY WITH ANTIBIOTIC SPACERS Left 02/06/2017   Procedure: Incisional total iknee with antibiotic spacer ;  Surgeon: Leandrew Koyanagi, MD;  Location: [facility];  Service: Orthopedics;  Laterality: Left;  . IR FLUORO GUIDE CV LINE RIGHT  02/10/2017  . IR REMOVAL TUN CV CATH W/O FL  04/01/2017  . IR US GUIDE VASC ACCESS RIGHT  02/10/2017  . IRRIGATION AND DEBRIDEMENT KNEE Left 03/12/2017   Procedure: IRRIGATION AND DEBRIDEMENT LEFT KNEE WITH WOUND VAC APPLICATION;  Surgeon: Leandrew Koyanagi, MD;  Location: [facility];  Service: Orthopedics;  Laterality: Left;  . JOINT REPLACEMENT    . KNEE ARTHROSCOPY Right 08/10/2014   Procedure: ARTHROSCOPY I & D KNEE;  Surgeon: Marianna Payment, MD;  Location: [facility];  Service: Orthopedics;  Laterality:  Right;  [name] KNEE ARTHROSCOPY Left 08/11/2014   Procedure: ARTHROSCOPIC WASHOUT LEFT KNEE;  Surgeon: Marianna Payment, MD;  Location: [facility];  Service: Orthopedics;  Laterality: Left;  . KNEE ARTHROSCOPY Left 08/19/2014   Procedure: ARTHROSCOPIC WASHOUT LEFT KNEE;  Surgeon: Leandrew Koyanagi, MD;  Location: [facility];  Service: Orthopedics;  Laterality: Left;  . KNEE ARTHROSCOPY WITH LATERAL MENISECTOMY Left 04/04/2015   Procedure: AND PARTIAL LATERAL MENISECTOMY;  Surgeon: Leandrew Koyanagi, MD;  Location: [facility];  Service: Orthopedics;  Laterality: Left;  . KNEE ARTHROSCOPY WITH MEDIAL MENISECTOMY Left 04/04/2015   Procedure: LEFT KNEE ARTHROSCOPY WITH PARTIAL MEDIAL MENISCECTOMY  AND SYNOVECTOMY;  Surgeon:  Leandrew Koyanagi, MD;  Location: [facility];  Service: Orthopedics;  Laterality: Left;  . SHOULDER ARTHROSCOPY Bilateral 08/10/2014   Procedure: I & D BILATERAL SHOULDERS ;  Surgeon: Marianna Payment, MD;  Location: [facility];  Service: Orthopedics;  Laterality: Bilateral;  . SMALL INTESTINE SURGERY     Due to Small Bowel Obstruction; "fixed it when they did my gallbladder OR"  . TEE WITHOUT CARDIOVERSION N/A 08/14/2014   Procedure: TRANSESOPHAGEAL ECHOCARDIOGRAM (TEE);  Surgeon: Thayer Headings, MD;  Location: [facility];  Service: Cardiovascular;  Laterality: N/A;  . TENOSYNOVECTOMY Right 08/11/2014   Procedure: RIGHT WRIST IRRIGATION AND DEBRIDEMENT, TENOSYNOVECTOMY;  Surgeon: Marianna Payment, MD;  Location: [facility];  Service: Orthopedics;  Laterality: Right;  . TOTAL KNEE ARTHROPLASTY Left 11/07/2015   Procedure: LEFT TOTAL KNEE ARTHROPLASTY WITH REVISION OF IMPLANTS;  Surgeon: Leandrew Koyanagi, MD;  Location: [facility];  Service: Orthopedics;  Laterality: Left;  . TUBAL LIGATION      There were no vitals filed for this visit.  Subjective Assessment - 11/26/17 1015    Subjective  She stood to make sandwich in kitchen. It was challenging but able to do it. She is wearing prosthesis 4hrs 2x/day.     Pertinent History  left TFA, DM, neuropathy, Hep C, HTN, CHF, depression, CKD st 5 not dialysis, arthritis, heart murmur,     Limitations  Standing;Walking;House hold activities    Patient Stated Goals  To walk with prosthesis in home & community, travel plane, car & cruise in September    Currently in Pain?  No/denies                       [facility] Adult PT Treatment/Exercise - 11/26/17 0930      Transfers   Transfers  Sit to Stand;Stand to Sit    Sit to Stand  5: Supervision;With upper extremity assist;With armrests;From chair/3-in-1;4: Min guard to RW, chairs with & without armrests    Sit to Stand Details  Verbal cues for sequencing;Verbal cues for technique;Verbal cues  for precautions/safety;Verbal cues for safe use of DME/AE    Stand to Sit  5: Supervision;With upper extremity assist;With armrests;To chair/3-in-1 from RW, chairs with & without armrests    Stand to Sit Details (indicate cue type and reason)  Verbal cues for precautions/safety;Verbal cues for safe use of DME/AE      Ambulation/Gait   Ambulation/Gait  Yes    Ambulation/Gait Assistance  5: Supervision    Ambulation Distance (Feet)  130 Feet 130' & 100'    Assistive device  Rolling walker;Prosthesis    Gait Pattern  Step-through pattern;Decreased stance time - left;Decreased step length - right;Decreased hip/knee flexion - left;Decreased weight shift to left;Antalgic;Lateral hip instability;Trunk flexed;Abducted - left    Stairs  Yes    Stairs Assistance  4: Min assist;4: Min guard    Stairs Assistance Details (indicate cue type and reason)  demo & verbal cues on single rail & cane use including varying location of rail.     Stair Management Technique  One rail Left;With cane;Step to pattern;Forwards    Number of Stairs  4    Ramp  5: Supervision RW & TFA prosthesis    Curb  5: Supervision RW & prosthesis      Self-Care   Self-Care  --    ADL's  --      Prosthetics   Prosthetic Care Comments   increasing wear to increase function during day, decrease energy expenditure & decrease fall risk.     Current prosthetic wear tolerance (days/week)   daily    Current prosthetic wear tolerance (#hours/day)   4 hrs 2x/day    Residual limb condition   intact per pt report.    Education Provided  Residual limb care;Proper wear schedule/adjustment;Proper weight-bearing schedule/adjustment;Correct ply sock adjustment;Proper Donning;Other (comment) see prosthetic care comments    Person(s) Educated  Patient    Education Method  Explanation;Verbal cues    Education Method  Verbalized understanding;Verbal cues required               PT Short Term Goals - 11/05/17 1628      PT SHORT TERM GOAL  #1   Title  Patient verbalizes initial understanding of need to adjust ply socks with limb volume changes. (All STGs Target Date: 12/04/2017)    Time  4    Period  Weeks    Status  New    Target Date  12/04/17      PT SHORT TERM GOAL #2   Title  Patient tolerates prosthesis wear >10hrs total /day without skin issues.     Time  4    Period  Weeks    Status  New    Target Date  12/04/17      PT SHORT TERM GOAL #3   Title  Patient standing balance without UE support stands 2 minutes and reaches 2" anteriorly with supervision.     Time  4    Period  Weeks    Status  New    Target Date  12/04/17      PT SHORT TERM GOAL #4   Title  Patient ambulates 150' outdoors on paved surfaces with RW & prosthesis with supervision.     Time  4    Period  Weeks    Status  New    Target Date  12/04/17      PT SHORT TERM GOAL #5   Title  Patient negotiates stairs with 1 rail/cane, ramp & curb with RW & prosthesis with supervision.     Time  4    Period  Weeks    Status  New    Target Date  12/04/17        PT Long Term Goals - 10/22/17 1447      PT LONG TERM GOAL #1   Title  Patient verbalizes & demonstrates proper prosthetic care to enable safe use of prosthesis. (All LTGs Target Date: 01/01/2018)    Time  12    Period  Weeks    Status  On-going    Target Date  01/01/18      PT LONG TERM GOAL #2   Title  Patient tolerates prosthesis wear >80% of awake hours without skin issues or  limb pain to enable function throughout her day.     Time  12    Period  Weeks    Status  On-going    Target Date  01/01/18      PT LONG TERM GOAL #3   Title  Patient ambulates 500' outdoors including grass with LRAD & prosthesis modified independent for community mobility.     Time  12    Period  Weeks    Status  On-going    Target Date  01/01/18      PT LONG TERM GOAL #4   Title  Patient negotiates ramps, curbs & stairs with LRAD & prosthesis modified independent to enable community mobility.      Time  12    Period  Weeks    Status  On-going    Target Date  01/01/18      PT LONG TERM GOAL #5   Title  Berg Balance Test >/= 30/56    Time  12    Period  Weeks    Status  On-going    Target Date  01/01/18            Plan - 11/26/17 1510    Clinical Impression Statement  Patient improved with ability to negotiate stairs with single rail & cane (similar to home entrance) with prosthesis & skilled instruction.  Patient is slowly improving amount of standing functional activities with PT instructions.      Rehab Potential  Good    Clinical Impairments Affecting Rehab Potential  transfemoral level amputation with multiple medical issues, significantly limited activity level for >2 years    PT Frequency  2x / week    PT Duration  12 weeks    PT Treatment/Interventions  ADLs/Self Care Home Management;Moist Heat;DME Instruction;Gait training;Stair training;Functional mobility training;Therapeutic activities;Therapeutic exercise;Balance training;Neuromuscular re-education;Patient/family education;Prosthetic Training;Manual techniques;Vestibular    PT Next Visit Plan  check STGs,  prosthetic gait with RW including ramps, curbs & stairs, work on turns 90*, 180* & 360*, balance reaching & picking up objects    Consulted and Agree with Plan of Care  Patient       Patient will benefit from skilled therapeutic intervention in order to improve the following deficits and impairments:  Abnormal gait, Decreased activity tolerance, Decreased balance, Decreased endurance, Decreased knowledge of use of DME, Decreased mobility, Decreased strength, Decreased range of motion, Impaired flexibility, Prosthetic Dependency, Postural dysfunction  Visit Diagnosis: Other abnormalities of gait and mobility  Muscle weakness (generalized)  Unsteadiness on feet     Problem List Patient Active Problem List   Diagnosis Date Noted  . Benign essential HTN   . Stage 5 chronic kidney disease not on chronic  dialysis ([place])   . Above knee amputation status, left ([place])   . DM type 2 causing CKD stage 5 ([place]) 03/21/2017  . Anemia of chronic disease 02/05/2017  . Dysplasia of cervix, high grade CIN 2 01/12/2017  . Chronic diastolic CHF (congestive heart failure) ([place]) 11/08/2015  . Total knee replacement status   . Diabetic peripheral neuropathy ([place])   . Major depression, recurrent, chronic ([place])   . Chronic kidney disease (CKD), stage IV (severe) ([facility])   . Hepatitis C 08/16/2014  . Peripheral neuropathy 06/24/2013  . Compulsive tobacco user syndrome 06/24/2013    Jamey Reas PT, DPT 11/26/2017, 3:15 PM  [facility] [address] [place], [place], [address] Phone: [phone]   Fax:  [phone]  Name: Brandy [facility]  MRN: [id number] Date of Birth: [birth year]

## 2017-12-01 ENCOUNTER — Encounter: Payer: Self-pay | Admitting: Physical Therapy

## 2017-12-01 ENCOUNTER — Ambulatory Visit: Payer: Medicare Other | Admitting: Physical Therapy

## 2017-12-01 ENCOUNTER — Other Ambulatory Visit (INDEPENDENT_AMBULATORY_CARE_PROVIDER_SITE_OTHER): Payer: Self-pay | Admitting: Orthopedic Surgery

## 2017-12-01 ENCOUNTER — Telehealth: Payer: Self-pay | Admitting: Emergency Medicine

## 2017-12-01 DIAGNOSIS — R2689 Other abnormalities of gait and mobility: Secondary | ICD-10-CM | POA: Diagnosis not present

## 2017-12-01 DIAGNOSIS — M6281 Muscle weakness (generalized): Secondary | ICD-10-CM | POA: Diagnosis not present

## 2017-12-01 DIAGNOSIS — R2681 Unsteadiness on feet: Secondary | ICD-10-CM | POA: Diagnosis not present

## 2017-12-01 DIAGNOSIS — M6249 Contracture of muscle, multiple sites: Secondary | ICD-10-CM | POA: Diagnosis not present

## 2017-12-01 NOTE — Telephone Encounter (Signed)
FYI: CRM for notification. See Telephone encounter for: 12/01/17.  [facility] w/ [facility] [phone] ext [phone]   Called in to make provider aware that they have received lab results that were sent to Dr. [name]. They seen pt on 6/3//19, they are aware that function is getting worse they are planning to start pt on Hemodialysis in the future.

## 2017-12-02 NOTE — Therapy (Signed)
[facility] [address] [place], [place], [address] Phone: [phone]   Fax:  [phone]  Physical Therapy Treatment  Patient Details  Name: Brandy Houston MRN: [id number] Date of Birth: [birth year] Referring Provider: Meridee Score, MD   Encounter Date: 12/01/2017  PT End of Session - 12/01/17 1227    Visit Number  13    Number of Visits  25    Date for PT Re-Evaluation  01/05/18    Authorization Type  Medicare & Medicaid    PT Start Time  4627    PT Stop Time  1055    PT Time Calculation (min)  40 min    Equipment Utilized During Treatment  Gait belt    Activity Tolerance  Patient tolerated treatment well    Behavior During Therapy  [facility] for tasks assessed/performed       Past Medical History:  Diagnosis Date  . Anemia   . Anxiety   . Arthritis    "in my joints" (03/10/2017)  . Chronic diastolic CHF (congestive heart failure) ([place])   . CKD (chronic kidney disease), stage IV ([place])    stage IV. previous HD, none currently 10/30/15 (confirmed 02/05/2017 & 03/10/2017)  . Depression    Chronic  . Diabetic peripheral neuropathy ([place])   . Dysrhythmia    tachycardia, normal ECHO 08-09-14  . GERD (gastroesophageal reflux disease)   . Heart murmur     dx'd 02/05/2017  . Hepatitis C    "tx'd in 2016; I'm negative now" (02/05/2017)  . History of blood transfusion 2017   "w/knee replacement"  . Hypertension   . Osteoarthritis of left knee   . Peripheral neuropathy   . Protein calorie malnutrition ([place])   . Septic arthritis ([place])   . Slow transit constipation   . Type II diabetes mellitus ([name])    IDDM  . Uncontrolled hypertension 02/18/2013  . Unsteady gait     Past Surgical History:  Procedure Laterality Date  . AMPUTATION Left 03/20/2017   Procedure: LEFT ABOVE KNEE AMPUTATION;  Surgeon: Newt Minion, MD;  Location: [facility];  Service: Orthopedics;  Laterality: Left;  . APPENDECTOMY    . AV FISTULA PLACEMENT Left  09/13/2014   Procedure: Brachial Artery to Brachial Vein Gortex Four - Seven Stretch GRAFT INSERTION Left Forearm;  Surgeon: Mal Misty, MD;  Location: [facility];  Service: Vascular;  Laterality: Left;  . BASCILIC VEIN TRANSPOSITION Right 10/26/2017   Procedure: BASILIC VEIN TRANSPOSITION FIRST STAGE RIGHT ARM;  Surgeon: Conrad [place] Lansdowne, MD;  Location: [facility];  Service: Vascular;  Laterality: Right;  . CHOLECYSTECTOMY OPEN    . COLON SURGERY    . EXCISIONAL TOTAL KNEE ARTHROPLASTY WITH ANTIBIOTIC SPACERS Left 02/06/2017   Procedure: Incisional total iknee with antibiotic spacer ;  Surgeon: Leandrew Koyanagi, MD;  Location: [place] [place];  Service: Orthopedics;  Laterality: Left;  . IR FLUORO GUIDE CV LINE RIGHT  02/10/2017  . IR REMOVAL TUN CV CATH W/O FL  04/01/2017  . IR US GUIDE VASC ACCESS RIGHT  02/10/2017  . IRRIGATION AND DEBRIDEMENT KNEE Left 03/12/2017   Procedure: IRRIGATION AND DEBRIDEMENT LEFT KNEE WITH WOUND VAC APPLICATION;  Surgeon: Leandrew Koyanagi, MD;  Location: [facility];  Service: Orthopedics;  Laterality: Left;  . JOINT REPLACEMENT    . KNEE ARTHROSCOPY Right 08/10/2014   Procedure: ARTHROSCOPY I & D KNEE;  Surgeon: Marianna Payment, MD;  Location: [facility];  Service: Orthopedics;  Laterality:  Right;  [name] KNEE ARTHROSCOPY Left 08/11/2014   Procedure: ARTHROSCOPIC WASHOUT LEFT KNEE;  Surgeon: Marianna Payment, MD;  Location: [facility];  Service: Orthopedics;  Laterality: Left;  . KNEE ARTHROSCOPY Left 08/19/2014   Procedure: ARTHROSCOPIC WASHOUT LEFT KNEE;  Surgeon: Leandrew Koyanagi, MD;  Location: [facility];  Service: Orthopedics;  Laterality: Left;  . KNEE ARTHROSCOPY WITH LATERAL MENISECTOMY Left 04/04/2015   Procedure: AND PARTIAL LATERAL MENISECTOMY;  Surgeon: Leandrew Koyanagi, MD;  Location: [facility];  Service: Orthopedics;  Laterality: Left;  . KNEE ARTHROSCOPY WITH MEDIAL MENISECTOMY Left 04/04/2015   Procedure: LEFT KNEE ARTHROSCOPY WITH PARTIAL MEDIAL MENISCECTOMY  AND SYNOVECTOMY;  Surgeon:  Leandrew Koyanagi, MD;  Location: [facility];  Service: Orthopedics;  Laterality: Left;  . SHOULDER ARTHROSCOPY Bilateral 08/10/2014   Procedure: I & D BILATERAL SHOULDERS ;  Surgeon: Marianna Payment, MD;  Location: [facility];  Service: Orthopedics;  Laterality: Bilateral;  . SMALL INTESTINE SURGERY     Due to Small Bowel Obstruction; "fixed it when they did my gallbladder OR"  . TEE WITHOUT CARDIOVERSION N/A 08/14/2014   Procedure: TRANSESOPHAGEAL ECHOCARDIOGRAM (TEE);  Surgeon: Thayer Headings, MD;  Location: [facility];  Service: Cardiovascular;  Laterality: N/A;  . TENOSYNOVECTOMY Right 08/11/2014   Procedure: RIGHT WRIST IRRIGATION AND DEBRIDEMENT, TENOSYNOVECTOMY;  Surgeon: Marianna Payment, MD;  Location: [facility];  Service: Orthopedics;  Laterality: Right;  . TOTAL KNEE ARTHROPLASTY Left 11/07/2015   Procedure: LEFT TOTAL KNEE ARTHROPLASTY WITH REVISION OF IMPLANTS;  Surgeon: Leandrew Koyanagi, MD;  Location: [facility];  Service: Orthopedics;  Laterality: Left;  . TUBAL LIGATION      There were no vitals filed for this visit.  Subjective Assessment - 12/01/17 1015    Subjective  She sees Dr. [name] July 3.  She walked out on porch with RW and sat in recliner. She is still too nervous to do steps with 1 rail & cane.     Pertinent History  left TFA, DM, neuropathy, Hep C, HTN, CHF, depression, CKD st 5 not dialysis, arthritis, heart murmur,     Limitations  Standing;Walking;House hold activities    Patient Stated Goals  To walk with prosthesis in home & community, travel plane, car & cruise in September    Currently in Pain?  No/denies                       [facility] Adult PT Treatment/Exercise - 12/01/17 1015      Transfers   Transfers  Sit to Stand;Stand to Sit    Sit to Stand  5: Supervision;With upper extremity assist;With armrests;From chair/3-in-1 to RW, chairs with & without armrests    Stand to Sit  5: Supervision;With upper extremity assist;With armrests;To  chair/3-in-1 from [place], chairs with & without armrests      Ambulation/Gait   Ambulation/Gait  Yes    Ambulation/Gait Assistance  5: Supervision;4: Min assist supervision RW & MinA cane/counter    Ambulation/Gait Assistance Details  verbal, tactile & demo cues on technique with cane & counter.  Assessed with canes with quad tip to determine optimal lighter offset handle with quad tip preferrable.     Ambulation Distance (Feet)  40 Feet 40' RW & 40' counter/cane    Assistive device  Rolling walker;Prosthesis;Straight cane    Gait Pattern  Step-through pattern;Decreased stance time - left;Decreased step length - right;Decreased hip/knee flexion - left;Decreased weight shift to left;Antalgic;Lateral hip instability;Trunk  flexed;Abducted - left    Ambulation Surface  Indoor;Level    Stairs  Yes    Stairs Assistance  5: Supervision    Stairs Assistance Details (indicate cue type and reason)  verbal & tactile cues wt shift over prosthesis in stance esp. descending    Stair Management Technique  One rail Left;With cane;Step to pattern;Forwards varied cane to assess optimal: Hurrycane, cane quad tip,SBQC    Number of Stairs  4 3 reps    Ramp  5: Supervision RW & TFA prosthesis    Curb  5: Supervision RW & prosthesis      Prosthetics   Prosthetic Care Comments   increasing wear to increase function during day, decrease energy expenditure & decrease fall risk.     Current prosthetic wear tolerance (days/week)   daily    Current prosthetic wear tolerance (#hours/day)   5 hrs 2x/day    Residual limb condition   intact per pt report.    Education Provided  Residual limb care;Proper wear schedule/adjustment;Proper weight-bearing schedule/adjustment;Correct ply sock adjustment;Proper Donning;Other (comment) see prosthetic care comments    Person(s) Educated  Patient    Education Method  Explanation;Demonstration;Tactile cues;Verbal cues    Education Method  Verbalized understanding;Returned  demonstration;Tactile cues required;Verbal cues required;Needs further instruction    Donning Prosthesis  Modified independent (device/increased time)             PT Education - 12/01/17 1200    Education provided  Yes    Education Details  cane differences, after assessing stairs & counter gait with variable canes, optimal / preferrable lighter straight cane offset handle with quad tip    Person(s) Educated  Patient    Methods  Explanation;Demonstration;Verbal cues    Comprehension  Verbalized understanding       PT Short Term Goals - 12/01/17 1657      PT SHORT TERM GOAL #1   Title  Patient verbalizes initial understanding of need to adjust ply socks with limb volume changes. (All STGs Target Date: 12/04/2017)    Baseline  MET 12/01/2017    Time  4    Period  Weeks    Status  Achieved      PT SHORT TERM GOAL #2   Title  Patient tolerates prosthesis wear >10hrs total /day without skin issues.     Baseline  MET 12/01/2017    Time  4    Period  Weeks    Status  Achieved      PT SHORT TERM GOAL #3   Title  Patient standing balance without UE support stands 2 minutes and reaches 2" anteriorly with supervision.     Baseline  MET 12/01/2017    Time  4    Period  Weeks    Status  Achieved      PT SHORT TERM GOAL #4   Title  Patient ambulates 150' outdoors on paved surfaces with RW & prosthesis with supervision.     Time  4    Period  Weeks    Status  On-going    Target Date  12/04/17      PT SHORT TERM GOAL #5   Title  Patient negotiates stairs with 1 rail/cane, ramp & curb with RW & prosthesis with supervision.     Baseline  MET 12/01/2017    Time  4    Period  Weeks    Status  Achieved        PT Long Term Goals - 10/22/17 1447  PT LONG TERM GOAL #1   Title  Patient verbalizes & demonstrates proper prosthetic care to enable safe use of prosthesis. (All LTGs Target Date: 01/01/2018)    Time  12    Period  Weeks    Status  On-going    Target Date  01/01/18       PT LONG TERM GOAL #2   Title  Patient tolerates prosthesis wear >80% of awake hours without skin issues or limb pain to enable function throughout her day.     Time  12    Period  Weeks    Status  On-going    Target Date  01/01/18      PT LONG TERM GOAL #3   Title  Patient ambulates 500' outdoors including grass with LRAD & prosthesis modified independent for community mobility.     Time  12    Period  Weeks    Status  On-going    Target Date  01/01/18      PT LONG TERM GOAL #4   Title  Patient negotiates ramps, curbs & stairs with LRAD & prosthesis modified independent to enable community mobility.     Time  12    Period  Weeks    Status  On-going    Target Date  01/01/18      PT LONG TERM GOAL #5   Title  Berg Balance Test >/= 30/56    Time  12    Period  Weeks    Status  On-going    Target Date  01/01/18            Plan - 12/01/17 1700    Clinical Impression Statement  Patient met 4 STGs checked today. PT assessed stairs & counter gait with varying cane designs. It appears straight cane offset handle with quad tip is optimal. Primary function will initially be negotiating steps with single left rail at entrance to her home.  Patient already owns a rollator walker which may improve function with ability to rest.     Rehab Potential  Good    Clinical Impairments Affecting Rehab Potential  transfemoral level amputation with multiple medical issues, significantly limited activity level for >2 years    PT Frequency  2x / week    PT Duration  12 weeks    PT Treatment/Interventions  ADLs/Self Care Home Management;Moist Heat;DME Instruction;Gait training;Stair training;Functional mobility training;Therapeutic activities;Therapeutic exercise;Balance training;Neuromuscular re-education;Patient/family education;Prosthetic Training;Manual techniques;Vestibular    PT Next Visit Plan  check remaining STG gait 150' with RW, instruct & train prosthetic gait with rollator walker.   Review stairs with left rail & cane.     Consulted and Agree with Plan of Care  Patient       Patient will benefit from skilled therapeutic intervention in order to improve the following deficits and impairments:  Abnormal gait, Decreased activity tolerance, Decreased balance, Decreased endurance, Decreased knowledge of use of DME, Decreased mobility, Decreased strength, Decreased range of motion, Impaired flexibility, Prosthetic Dependency, Postural dysfunction  Visit Diagnosis: Other abnormalities of gait and mobility  Muscle weakness (generalized)  Unsteadiness on feet     Problem List Patient Active Problem List   Diagnosis Date Noted  . Benign essential HTN   . Stage 5 chronic kidney disease not on chronic dialysis ([place])   . Above knee amputation status, left ([place])   . DM type 2 causing CKD stage 5 ([place]) 03/21/2017  . Anemia of chronic disease 02/05/2017  . Dysplasia of cervix, high  grade CIN 2 01/12/2017  . Chronic diastolic CHF (congestive heart failure) ([place]) 11/08/2015  . Total knee replacement status   . Diabetic peripheral neuropathy ([place])   . Major depression, recurrent, chronic ([place])   . Chronic kidney disease (CKD), stage IV (severe) ([place])   . Hepatitis C 08/16/2014  . Peripheral neuropathy 06/24/2013  . Compulsive tobacco user syndrome 06/24/2013    Jamey Reas PT, DPT 12/02/2017, 7:06 AM  [facility] [address] [place], [place], [address] Phone: [phone]   Fax:  [phone]  Name: Brandy Houston MRN: [id number] Date of Birth: 28-Jun-1955

## 2017-12-03 ENCOUNTER — Encounter: Payer: Self-pay | Admitting: Physical Therapy

## 2017-12-03 ENCOUNTER — Ambulatory Visit: Payer: Medicare Other | Admitting: Physical Therapy

## 2017-12-03 DIAGNOSIS — M6281 Muscle weakness (generalized): Secondary | ICD-10-CM | POA: Diagnosis not present

## 2017-12-03 DIAGNOSIS — R2689 Other abnormalities of gait and mobility: Secondary | ICD-10-CM

## 2017-12-03 DIAGNOSIS — M6249 Contracture of muscle, multiple sites: Secondary | ICD-10-CM | POA: Diagnosis not present

## 2017-12-03 DIAGNOSIS — R2681 Unsteadiness on feet: Secondary | ICD-10-CM | POA: Diagnosis not present

## 2017-12-04 NOTE — Therapy (Signed)
[facility] [address] [place], [place], [address] Phone: [phone]   Fax:  [phone]  Physical Therapy Treatment  Patient Details  Name: Brandy Houston MRN: [id number] Date of Birth: Aug 30, 1955 Referring Provider: Meridee Score, MD   Encounter Date: 12/03/2017     12/03/17 1026  PT Visits / Re-Eval  Visit Number 14  Number of Visits 25  Date for PT Re-Evaluation 01/05/18  Authorization  Authorization Type Medicare & Medicaid  PT Time Calculation  PT Start Time 1019  PT Stop Time 1102  PT Time Calculation (min) 43 min  PT - End of Session  Equipment Utilized During Treatment Gait belt  Activity Tolerance Patient tolerated treatment well  Behavior During Therapy WFL for tasks assessed/performed    Past Medical History:  Diagnosis Date  . Anemia   . Anxiety   . Arthritis    "in my joints" (03/10/2017)  . Chronic diastolic CHF (congestive heart failure) ([place])   . CKD (chronic kidney disease), stage IV ([place])    stage IV. previous HD, none currently 10/30/15 (confirmed 02/05/2017 & 03/10/2017)  . Depression    Chronic  . Diabetic peripheral neuropathy ([facility])   . Dysrhythmia    tachycardia, normal ECHO 08-09-14  . GERD (gastroesophageal reflux disease)   . Heart murmur     dx'd 02/05/2017  . Hepatitis C    "tx'd in 2016; I'm negative now" (02/05/2017)  . History of blood transfusion 2017   "w/knee replacement"  . Hypertension   . Osteoarthritis of left knee   . Peripheral neuropathy   . Protein calorie malnutrition ([place])   . Septic arthritis ([place])   . Slow transit constipation   . Type II diabetes mellitus ([name])    IDDM  . Uncontrolled hypertension 02/18/2013  . Unsteady gait     Past Surgical History:  Procedure Laterality Date  . AMPUTATION Left 03/20/2017   Procedure: LEFT ABOVE KNEE AMPUTATION;  Surgeon: Newt Minion, MD;  Location: [facility];  Service: Orthopedics;  Laterality: Left;  . APPENDECTOMY     . AV FISTULA PLACEMENT Left 09/13/2014   Procedure: Brachial Artery to Brachial Vein Gortex Four - Seven Stretch GRAFT INSERTION Left Forearm;  Surgeon: Mal Misty, MD;  Location: [facility];  Service: Vascular;  Laterality: Left;  . BASCILIC VEIN TRANSPOSITION Right 10/26/2017   Procedure: BASILIC VEIN TRANSPOSITION FIRST STAGE RIGHT ARM;  Surgeon: Conrad Sapulpa, MD;  Location: [facility];  Service: Vascular;  Laterality: Right;  . CHOLECYSTECTOMY OPEN    . COLON SURGERY    . EXCISIONAL TOTAL KNEE ARTHROPLASTY WITH ANTIBIOTIC SPACERS Left 02/06/2017   Procedure: Incisional total iknee with antibiotic spacer ;  Surgeon: Leandrew Koyanagi, MD;  Location: [facility];  Service: Orthopedics;  Laterality: Left;  . IR FLUORO GUIDE CV LINE RIGHT  02/10/2017  . IR REMOVAL TUN CV CATH W/O FL  04/01/2017  . IR US GUIDE VASC ACCESS RIGHT  02/10/2017  . IRRIGATION AND DEBRIDEMENT KNEE Left 03/12/2017   Procedure: IRRIGATION AND DEBRIDEMENT LEFT KNEE WITH WOUND VAC APPLICATION;  Surgeon: Leandrew Koyanagi, MD;  Location: [facility];  Service: Orthopedics;  Laterality: Left;  . JOINT REPLACEMENT    . KNEE ARTHROSCOPY Right 08/10/2014   Procedure: ARTHROSCOPY I & D KNEE;  Surgeon: Marianna Payment, MD;  Location: [facility];  Service: Orthopedics;  Laterality: Right;  . KNEE ARTHROSCOPY Left 08/11/2014   Procedure: ARTHROSCOPIC WASHOUT LEFT KNEE;  Surgeon: Mosetta Pigeon  [name], MD;  Location: [facility];  Service: Orthopedics;  Laterality: Left;  . KNEE ARTHROSCOPY Left 08/19/2014   Procedure: ARTHROSCOPIC WASHOUT LEFT KNEE;  Surgeon: Leandrew Koyanagi, MD;  Location: [facility];  Service: Orthopedics;  Laterality: Left;  . KNEE ARTHROSCOPY WITH LATERAL MENISECTOMY Left 04/04/2015   Procedure: AND PARTIAL LATERAL MENISECTOMY;  Surgeon: Leandrew Koyanagi, MD;  Location: [facility];  Service: Orthopedics;  Laterality: Left;  . KNEE ARTHROSCOPY WITH MEDIAL MENISECTOMY Left 04/04/2015   Procedure: LEFT KNEE ARTHROSCOPY WITH PARTIAL MEDIAL  MENISCECTOMY  AND SYNOVECTOMY;  Surgeon: Leandrew Koyanagi, MD;  Location: [facility];  Service: Orthopedics;  Laterality: Left;  . SHOULDER ARTHROSCOPY Bilateral 08/10/2014   Procedure: I & D BILATERAL SHOULDERS ;  Surgeon: Marianna Payment, MD;  Location: [facility];  Service: Orthopedics;  Laterality: Bilateral;  . SMALL INTESTINE SURGERY     Due to Small Bowel Obstruction; "fixed it when they did my gallbladder OR"  . TEE WITHOUT CARDIOVERSION N/A 08/14/2014   Procedure: TRANSESOPHAGEAL ECHOCARDIOGRAM (TEE);  Surgeon: Thayer Headings, MD;  Location: [facility];  Service: Cardiovascular;  Laterality: N/A;  . TENOSYNOVECTOMY Right 08/11/2014   Procedure: RIGHT WRIST IRRIGATION AND DEBRIDEMENT, TENOSYNOVECTOMY;  Surgeon: Marianna Payment, MD;  Location: [facility];  Service: Orthopedics;  Laterality: Right;  . TOTAL KNEE ARTHROPLASTY Left 11/07/2015   Procedure: LEFT TOTAL KNEE ARTHROPLASTY WITH REVISION OF IMPLANTS;  Surgeon: Leandrew Koyanagi, MD;  Location: [facility];  Service: Orthopedics;  Laterality: Left;  . TUBAL LIGATION      There were no vitals filed for this visit.      12/03/17 1025  Symptoms/Limitations  Subjective Reports all over soreness, especially in her back/arms, for the last week or so. She wonder's if it is due to the recent medication she started. No falls.   Pertinent History left TFA, DM, neuropathy, Hep C, HTN, CHF, depression, CKD st 5 not dialysis, arthritis, heart murmur,   Limitations Standing;Walking;House hold activities  Patient Stated Goals To walk with prosthesis in home & community, travel plane, car & cruise in September  Pain Assessment  Currently in Pain? No/denies  Pain Score 0       12/03/17 1027  Transfers  Transfers Sit to Stand;Stand to Sit  Sit to Stand 5: Supervision;With upper extremity assist;With armrests;From chair/3-in-1  Sit to Stand Details Verbal cues for sequencing;Verbal cues for technique;Verbal cues for precautions/safety;Verbal  cues for safe use of DME/AE  Sit to Stand Details (indicate cue type and reason) no cues when standing to RW, cues on brake use with rollator. needs support for balance with standing (RW or rollator)  Stand to Sit 5: Supervision;With upper extremity assist;With armrests;To chair/3-in-1  Stand to Sit Details (indicate cue type and reason) Verbal cues for precautions/safety;Verbal cues for safe use of DME/AE  Stand to Sit Details no cues/assist needed with sitting from RW, cues on brake use with rollator. needs support (RW or rollator) to sit safety due to balance issues.   Ambulation/Gait  Ambulation/Gait Yes  Ambulation/Gait Assistance 4: Min guard;4: Min assist  Ambulation/Gait Assistance Details min guard to min assist with gait on outdoor uneven paved surfaces. cues on hand position with rollator. cues with all gait for posture, prosthetic step placement, for increased base of support and for increased prosthetic knee flexion with swing phase vs stiff legging it  Ambulation Distance (Feet) 150 Feet (x1 with RW; 85 x2 with rollator)  Assistive device Rolling walker;Prosthesis;Rollator  Gait Pattern Step-through pattern;Decreased stance time - left;Decreased step length - right;Decreased hip/knee flexion - left;Decreased weight shift to left;Antalgic;Lateral hip instability;Trunk flexed;Abducted - left  Ambulation Surface Level;Unlevel;Indoor;Outdoor;Paved  Gait Comments initiated gait training with rollator today with cues needed on use of brakes and proximity with gait.     PT Short Term Goals - 12/04/17 2231      PT SHORT TERM GOAL #1   Title  Patient verbalizes initial understanding of need to adjust ply socks with limb volume changes. (All STGs Target Date: 12/04/2017)    Baseline  MET 12/01/2017    Status  Achieved      PT SHORT TERM GOAL #2   Title  Patient tolerates prosthesis wear >10hrs total /day without skin issues.     Baseline  MET 12/01/2017    Status  Achieved      PT SHORT  TERM GOAL #3   Title  Patient standing balance without UE support stands 2 minutes and reaches 2" anteriorly with supervision.     Baseline  MET 12/01/2017    Status  Achieved      PT SHORT TERM GOAL #4   Title  Patient ambulates 150' outdoors on paved surfaces with RW & prosthesis with supervision.     Baseline  12/03/17: pt met the distance, not the assistance level    Status  Partially Met      PT SHORT TERM GOAL #5   Title  Patient negotiates stairs with 1 rail/cane, ramp & curb with RW & prosthesis with supervision.     Baseline  MET 12/01/2017    Time  4    Period  Weeks    Status  Achieved          PT Long Term Goals - 10/22/17 1447      PT LONG TERM GOAL #1   Title  Patient verbalizes & demonstrates proper prosthetic care to enable safe use of prosthesis. (All LTGs Target Date: 01/01/2018)    Time  12    Period  Weeks    Status  On-going    Target Date  01/01/18      PT LONG TERM GOAL #2   Title  Patient tolerates prosthesis wear >80% of awake hours without skin issues or limb pain to enable function throughout her day.     Time  12    Period  Weeks    Status  On-going    Target Date  01/01/18      PT LONG TERM GOAL #3   Title  Patient ambulates 500' outdoors including grass with LRAD & prosthesis modified independent for community mobility.     Time  12    Period  Weeks    Status  On-going    Target Date  01/01/18      PT LONG TERM GOAL #4   Title  Patient negotiates ramps, curbs & stairs with LRAD & prosthesis modified independent to enable community mobility.     Time  12    Period  Weeks    Status  On-going    Target Date  01/01/18      PT LONG TERM GOAL #5   Title  Berg Balance Test >/= 30/56    Time  12    Period  Weeks    Status  On-going    Target Date  01/01/18         12/03/17 1027  Plan  Clinical Impression Statement Today's skilled session focused  checking remaining STG and then initiated gait training with rollator. Pt needs continued  work with rollator on level/unlevel surfaces and barriers before she is safe to use it in the community. Pt is progressing toward goals and should benefit from continued PT to progress toward unmet goals.   Pt will benefit from skilled therapeutic intervention in order to improve on the following deficits Abnormal gait;Decreased activity tolerance;Decreased balance;Decreased endurance;Decreased knowledge of use of DME;Decreased mobility;Decreased strength;Decreased range of motion;Impaired flexibility;Prosthetic Dependency;Postural dysfunction  Rehab Potential Good  Clinical Impairments Affecting Rehab Potential transfemoral level amputation with multiple medical issues, significantly limited activity level for >2 years  PT Frequency 2x / week  PT Duration 12 weeks  PT Treatment/Interventions ADLs/Self Care Home Management;Moist Heat;DME Instruction;Gait training;Stair training;Functional mobility training;Therapeutic activities;Therapeutic exercise;Balance training;Neuromuscular re-education;Patient/family education;Prosthetic Training;Manual techniques;Vestibular  PT Next Visit Plan continued trainign with prosthetic gait with rollator walker, initiate barriers with rollator when ready.  Review stairs with left rail & cane.   Consulted and Agree with Plan of Care Patient       Patient will benefit from skilled therapeutic intervention in order to improve the following deficits and impairments:  Abnormal gait, Decreased activity tolerance, Decreased balance, Decreased endurance, Decreased knowledge of use of DME, Decreased mobility, Decreased strength, Decreased range of motion, Impaired flexibility, Prosthetic Dependency, Postural dysfunction  Visit Diagnosis: Other abnormalities of gait and mobility  Muscle weakness (generalized)  Unsteadiness on feet     Problem List Patient Active Problem List   Diagnosis Date Noted  . Benign essential HTN   . Stage 5 chronic kidney disease not on  chronic dialysis ([facility])   . Above knee amputation status, left ([facility])   . DM type 2 causing CKD stage 5 ([facility]) 03/21/2017  . Anemia of chronic disease 02/05/2017  . Dysplasia of cervix, high grade CIN 2 01/12/2017  . Chronic diastolic CHF (congestive heart failure) ([place] [facility]) 11/08/2015  . Total knee replacement status   . Diabetic peripheral neuropathy ([facility])   . Major depression, recurrent, chronic ([place])   . Chronic kidney disease (CKD), stage IV (severe) ([facility] [place])   . Hepatitis C 08/16/2014  . Peripheral neuropathy 06/24/2013  . Compulsive tobacco user syndrome 06/24/2013    Willow Ora, PTA, [facility] [address] [place], [place] [address] [phone] 12/04/17, 10:30 PM   Name: Brandy Houston MRN: [id number] Date of Birth: Feb 08, 1956

## 2017-12-08 ENCOUNTER — Encounter: Payer: Self-pay | Admitting: Physical Therapy

## 2017-12-08 ENCOUNTER — Ambulatory Visit: Payer: Medicare Other | Admitting: Physical Therapy

## 2017-12-08 DIAGNOSIS — R2689 Other abnormalities of gait and mobility: Secondary | ICD-10-CM

## 2017-12-08 DIAGNOSIS — M6281 Muscle weakness (generalized): Secondary | ICD-10-CM | POA: Diagnosis not present

## 2017-12-08 DIAGNOSIS — R2681 Unsteadiness on feet: Secondary | ICD-10-CM | POA: Diagnosis not present

## 2017-12-08 DIAGNOSIS — M6249 Contracture of muscle, multiple sites: Secondary | ICD-10-CM | POA: Diagnosis not present

## 2017-12-09 ENCOUNTER — Ambulatory Visit: Payer: Medicare Other

## 2017-12-09 NOTE — Therapy (Signed)
[facility] [address] [place], [place], [address] Phone: [phone]   Fax:  [phone]  Physical Therapy Treatment  Patient Details  Name: Brandy Houston MRN: [id number] Date of Birth: 08-27-1955 Referring Provider: Meridee Score, MD   Encounter Date: 12/08/2017  PT End of Session - 12/08/17 1059    Visit Number  15    Number of Visits  25    Date for PT Re-Evaluation  01/05/18    Authorization Type  Medicare & Medicaid    PT Start Time  4163    PT Stop Time  1057    PT Time Calculation (min)  42 min    Equipment Utilized During Treatment  Gait belt    Activity Tolerance  Patient tolerated treatment well    Behavior During Therapy  WFL for tasks assessed/performed       Past Medical History:  Diagnosis Date  . Anemia   . Anxiety   . Arthritis    "in my joints" (03/10/2017)  . Chronic diastolic CHF (congestive heart failure) ([place])   . CKD (chronic kidney disease), stage IV ([place])    stage IV. previous HD, none currently 10/30/15 (confirmed 02/05/2017 & 03/10/2017)  . Depression    Chronic  . Diabetic peripheral neuropathy ([place])   . Dysrhythmia    tachycardia, normal ECHO 08-09-14  . GERD (gastroesophageal reflux disease)   . Heart murmur     dx'd 02/05/2017  . Hepatitis C    "tx'd in 2016; I'm negative now" (02/05/2017)  . History of blood transfusion 2017   "w/knee replacement"  . Hypertension   . Osteoarthritis of left knee   . Peripheral neuropathy   . Protein calorie malnutrition ([place])   . Septic arthritis ([place])   . Slow transit constipation   . Type II diabetes mellitus ([name])    IDDM  . Uncontrolled hypertension 02/18/2013  . Unsteady gait     Past Surgical History:  Procedure Laterality Date  . AMPUTATION Left 03/20/2017   Procedure: LEFT ABOVE KNEE AMPUTATION;  Surgeon: Newt Minion, MD;  Location: [facility];  Service: Orthopedics;  Laterality: Left;  . APPENDECTOMY    . AV FISTULA PLACEMENT Left  09/13/2014   Procedure: Brachial Artery to Brachial Vein Gortex Four - Seven Stretch GRAFT INSERTION Left Forearm;  Surgeon: Mal Misty, MD;  Location: [facility];  Service: Vascular;  Laterality: Left;  . BASCILIC VEIN TRANSPOSITION Right 10/26/2017   Procedure: BASILIC VEIN TRANSPOSITION FIRST STAGE RIGHT ARM;  Surgeon: Conrad Greenbush, MD;  Location: [facility];  Service: Vascular;  Laterality: Right;  . CHOLECYSTECTOMY OPEN    . COLON SURGERY    . EXCISIONAL TOTAL KNEE ARTHROPLASTY WITH ANTIBIOTIC SPACERS Left 02/06/2017   Procedure: Incisional total iknee with antibiotic spacer ;  Surgeon: Leandrew Koyanagi, MD;  Location: [facility];  Service: Orthopedics;  Laterality: Left;  . IR FLUORO GUIDE CV LINE RIGHT  02/10/2017  . IR REMOVAL TUN CV CATH W/O FL  04/01/2017  . IR US GUIDE VASC ACCESS RIGHT  02/10/2017  . IRRIGATION AND DEBRIDEMENT KNEE Left 03/12/2017   Procedure: IRRIGATION AND DEBRIDEMENT LEFT KNEE WITH WOUND VAC APPLICATION;  Surgeon: Leandrew Koyanagi, MD;  Location: [facility];  Service: Orthopedics;  Laterality: Left;  . JOINT REPLACEMENT    . KNEE ARTHROSCOPY Right 08/10/2014   Procedure: ARTHROSCOPY I & D KNEE;  Surgeon: Marianna Payment, MD;  Location: [facility];  Service: Orthopedics;  Laterality:  Right;  [name] KNEE ARTHROSCOPY Left 08/11/2014   Procedure: ARTHROSCOPIC WASHOUT LEFT KNEE;  Surgeon: Marianna Payment, MD;  Location: [facility];  Service: Orthopedics;  Laterality: Left;  . KNEE ARTHROSCOPY Left 08/19/2014   Procedure: ARTHROSCOPIC WASHOUT LEFT KNEE;  Surgeon: Leandrew Koyanagi, MD;  Location: [facility];  Service: Orthopedics;  Laterality: Left;  . KNEE ARTHROSCOPY WITH LATERAL MENISECTOMY Left 04/04/2015   Procedure: AND PARTIAL LATERAL MENISECTOMY;  Surgeon: Leandrew Koyanagi, MD;  Location: [facility];  Service: Orthopedics;  Laterality: Left;  . KNEE ARTHROSCOPY WITH MEDIAL MENISECTOMY Left 04/04/2015   Procedure: LEFT KNEE ARTHROSCOPY WITH PARTIAL MEDIAL MENISCECTOMY  AND SYNOVECTOMY;  Surgeon:  Leandrew Koyanagi, MD;  Location: [facility];  Service: Orthopedics;  Laterality: Left;  . SHOULDER ARTHROSCOPY Bilateral 08/10/2014   Procedure: I & D BILATERAL SHOULDERS ;  Surgeon: Marianna Payment, MD;  Location: [facility];  Service: Orthopedics;  Laterality: Bilateral;  . SMALL INTESTINE SURGERY     Due to Small Bowel Obstruction; "fixed it when they did my gallbladder OR"  . TEE WITHOUT CARDIOVERSION N/A 08/14/2014   Procedure: TRANSESOPHAGEAL ECHOCARDIOGRAM (TEE);  Surgeon: Thayer Headings, MD;  Location: [facility];  Service: Cardiovascular;  Laterality: N/A;  . TENOSYNOVECTOMY Right 08/11/2014   Procedure: RIGHT WRIST IRRIGATION AND DEBRIDEMENT, TENOSYNOVECTOMY;  Surgeon: Marianna Payment, MD;  Location: [facility];  Service: Orthopedics;  Laterality: Right;  . TOTAL KNEE ARTHROPLASTY Left 11/07/2015   Procedure: LEFT TOTAL KNEE ARTHROPLASTY WITH REVISION OF IMPLANTS;  Surgeon: Leandrew Koyanagi, MD;  Location: [facility];  Service: Orthopedics;  Laterality: Left;  . TUBAL LIGATION      There were no vitals filed for this visit.  Subjective Assessment - 12/08/17 1015    Subjective  No falls. She has been walking in house with walker & on porch. Son has not been able to help with stairs with rail.     Pertinent History  left TFA, DM, neuropathy, Hep C, HTN, CHF, depression, CKD st 5 not dialysis, arthritis, heart murmur,     Limitations  Standing;Walking;House hold activities    Patient Stated Goals  To walk with prosthesis in home & community, travel plane, car & cruise in September    Currently in Pain?  No/denies                       [facility] Adult PT Treatment/Exercise - 12/08/17 1015      Transfers   Transfers  Sit to Stand;Stand to Sit    Sit to Stand  4: Min guard;With upper extremity assist;With armrests;From chair/3-in-1;5: Supervision Min guard to forearm crutches, supervision to RW    Sit to Stand Details  Verbal cues for sequencing;Verbal cues for  technique;Verbal cues for precautions/safety;Verbal cues for safe use of DME/AE;Visual cues for safe use of DME/AE    Sit to Stand Details (indicate cue type and reason)  PT demo, instructed in technique with forearm crutches & Left TFA prosthesis    Stand to Sit  4: Min guard;5: Supervision;With upper extremity assist;With armrests;To chair/3-in-1 Min guard with forearm crutches & supervision with RW    Stand to Sit Details (indicate cue type and reason)  Verbal cues for precautions/safety;Verbal cues for safe use of DME/AE;Visual cues for safe use of DME/AE;Verbal cues for technique    Stand to Sit Details  PT demo, instructed in technique with forearm crutches & Left TFA prosthesis  Ambulation/Gait   Ambulation/Gait  Yes    Ambulation/Gait Assistance  4: Min assist;5: Supervision MinA forearm crutches & Supervision RW    Ambulation/Gait Assistance Details  PT demo, instructed in 3 point gait pattern with forearm crutches. Tactile/manual & verbal cues on weight shift & balance in gait.     Ambulation Distance (Feet)  100 Feet 100' forearm crutches & 150' RW    Assistive device  Prosthesis;Lofstrands;R Forearm Crutch;L Forearm Crutch;Rolling walker    Gait Pattern  Step-through pattern;Decreased stance time - left;Decreased step length - right;Decreased hip/knee flexion - left;Decreased weight shift to left;Antalgic;Lateral hip instability;Trunk flexed;Abducted - left    Ambulation Surface  Indoor;Level    Stairs  No    Stairs Assistance  5: Supervision;4: Min guard    Stairs Assistance Details (indicate cue type and reason)  Verbal cues on use of cane / crutch in contralateral UE to left rail (home entrance) and wt shift over prosthesis in stance    Stair Management Technique  One rail Left;With cane;With crutches;Step to pattern;Forwards    Number of Stairs  4 3 reps    Ramp  3: Mod assist forearm crutches & TFA prosthesis    Ramp Details (indicate cue type and reason)  demo & verbal cues  on technique with forearm crutches & TFA prosthesis    Curb  3: Mod assist forearm crutches & TFA prosthesis    Curb Details (indicate cue type and reason)  demo & verbal cues on technique with forearm crutches & TFA prosthesis    Gait Comments  --      Prosthetics   Prosthetic Care Comments   reviewed recommendation to tighten suspension strap during day as her limb seats deeper in socket creating looseness to strap & pistoning with impaired prosthesis control. Easier to tighten strap with pants down, not in way so recommend performing standing after toileting.     Current prosthetic wear tolerance (days/week)   daily    Current prosthetic wear tolerance (#hours/day)   5-6 hrs 2x/day    Current prosthetic weight-bearing tolerance (hours/day)   Patient reports limb pain 3/10 with gait activities.     Residual limb condition   intact per pt report.    Education Provided  Residual limb care;Proper Donning;Other (comment) see prosthetic care comments    Person(s) Educated  Patient    Education Method  Explanation;Demonstration;Verbal cues    Education Method  Verbalized understanding;Verbal cues required;Needs further instruction    Donning Prosthesis  Supervision    Doffing Prosthesis  Modified independent (device/increased time)               PT Short Term Goals - 12/04/17 2231      PT SHORT TERM GOAL #1   Title  Patient verbalizes initial understanding of need to adjust ply socks with limb volume changes. (All STGs Target Date: 12/04/2017)    Baseline  MET 12/01/2017    Status  Achieved      PT SHORT TERM GOAL #2   Title  Patient tolerates prosthesis wear >10hrs total /day without skin issues.     Baseline  MET 12/01/2017    Status  Achieved      PT SHORT TERM GOAL #3   Title  Patient standing balance without UE support stands 2 minutes and reaches 2" anteriorly with supervision.     Baseline  MET 12/01/2017    Status  Achieved      PT SHORT TERM GOAL #4   Title  Patient  ambulates 150' outdoors on paved surfaces with RW & prosthesis with supervision.     Baseline  12/03/17: pt met the distance, not the assistance level    Status  Partially Met      PT SHORT TERM GOAL #5   Title  Patient negotiates stairs with 1 rail/cane, ramp & curb with RW & prosthesis with supervision.     Baseline  MET 12/01/2017    Time  4    Period  Weeks    Status  Achieved        PT Long Term Goals - 10/22/17 1447      PT LONG TERM GOAL #1   Title  Patient verbalizes & demonstrates proper prosthetic care to enable safe use of prosthesis. (All LTGs Target Date: 01/01/2018)    Time  12    Period  Weeks    Status  On-going    Target Date  01/01/18      PT LONG TERM GOAL #2   Title  Patient tolerates prosthesis wear >80% of awake hours without skin issues or limb pain to enable function throughout her day.     Time  12    Period  Weeks    Status  On-going    Target Date  01/01/18      PT LONG TERM GOAL #3   Title  Patient ambulates 500' outdoors including grass with LRAD & prosthesis modified independent for community mobility.     Time  12    Period  Weeks    Status  On-going    Target Date  01/01/18      PT LONG TERM GOAL #4   Title  Patient negotiates ramps, curbs & stairs with LRAD & prosthesis modified independent to enable community mobility.     Time  12    Period  Weeks    Status  On-going    Target Date  01/01/18      PT LONG TERM GOAL #5   Title  Berg Balance Test >/= 30/56    Time  12    Period  Weeks    Status  On-going    Target Date  01/01/18            Plan - 12/08/17 1641    Clinical Impression Statement  Today's skilled PT session focused on introducing forearm crutches for prosthetic gait with TFA prosthesis. If patient can ambulate with forearm crutches vs RW, then community mobility would be improved. She has potential to use forearm crutches with skilled training.     Rehab Potential  Good    Clinical Impairments Affecting Rehab  Potential  transfemoral level amputation with multiple medical issues, significantly limited activity level for >2 years    PT Frequency  2x / week    PT Duration  12 weeks    PT Treatment/Interventions  ADLs/Self Care Home Management;Moist Heat;DME Instruction;Gait training;Stair training;Functional mobility training;Therapeutic activities;Therapeutic exercise;Balance training;Neuromuscular re-education;Patient/family education;Prosthetic Training;Manual techniques;Vestibular    PT Next Visit Plan  work on community prosthetic gait activities with forearm crutches & household mobility with rollator to transport items    Consulted and Agree with Plan of Care  Patient       Patient will benefit from skilled therapeutic intervention in order to improve the following deficits and impairments:  Abnormal gait, Decreased activity tolerance, Decreased balance, Decreased endurance, Decreased knowledge of use of DME, Decreased mobility, Decreased strength, Decreased range of motion, Impaired flexibility, Prosthetic Dependency, Postural dysfunction  Visit Diagnosis: Other abnormalities of gait and mobility  Muscle weakness (generalized)  Unsteadiness on feet     Problem List Patient Active Problem List   Diagnosis Date Noted  . Benign essential HTN   . Stage 5 chronic kidney disease not on chronic dialysis ([place])   . Above knee amputation status, left ([place])   . DM type 2 causing CKD stage 5 ([place]) 03/21/2017  . Anemia of chronic disease 02/05/2017  . Dysplasia of cervix, high grade CIN 2 01/12/2017  . Chronic diastolic CHF (congestive heart failure) ([place]) 11/08/2015  . Total knee replacement status   . Diabetic peripheral neuropathy ([place])   . Major depression, recurrent, chronic ([place])   . Chronic kidney disease (CKD), stage IV (severe) ([place])   . Hepatitis C 08/16/2014  . Peripheral neuropathy 06/24/2013  . Compulsive tobacco user syndrome 06/24/2013    Jamey Reas PT, DPT 12/09/2017,  7:01 AM  [facility] [address] [place], [place], [address] Phone: [phone]   Fax:  [phone]  Name: Brandy Houston MRN: [id number] Date of Birth: July 05, 1955

## 2017-12-09 NOTE — Progress Notes (Deleted)
POST OPERATIVE OFFICE NOTE    CC:  F/u for surgery  HPI:  This is a 62 y.o. female who is s/p right 1st stage BVT on 10/26/17 by Dr. Bridgett Larsson.    Allergies  Allergen Reactions  . Compazine [Prochlorperazine] Shortness Of Breath, Swelling and Other (See Comments)    TONGUE SWELLS  . Shellfish-Derived Products Anaphylaxis  . Iodinated Diagnostic Agents Hives and Rash  . Omnipaque [Iohexol] Hives  . Sulfa Antibiotics Rash    Current Outpatient Medications  Medication Sig Dispense Refill  . amLODipine (NORVASC) 10 MG tablet Take 1 tablet (10 mg total) by mouth at bedtime. 30 tablet 0  . aspirin EC 81 MG tablet Take 81 mg by mouth daily.    [name] atorvastatin (LIPITOR) 10 MG tablet Take 1 tablet (10 mg total) by mouth at bedtime. 90 tablet 3  . Blood Glucose Monitoring Suppl (ACCU-CHEK AVIVA PLUS) W/DEVICE KIT Check sugars TID for E11.65 (Patient not taking: Reported on 10/20/2017) 1 kit 0  . calcitRIOL (ROCALTROL) 0.25 MCG capsule Take 1 capsule (0.25 mcg total) by mouth every Monday, Wednesday, and Friday. 90 capsule 0  . calcium carbonate (TUMS EX) 750 MG chewable tablet Chew 1 tablet by mouth 3 (three) times daily as needed for heartburn.     . cloNIDine (CATAPRES) 0.2 MG tablet Take 0.2 mg by mouth 2 (two) times daily.    . ferrous sulfate 325 (65 FE) MG tablet Take 1 tablet (325 mg total) by mouth 2 (two) times daily with a meal. 60 tablet 3  . furosemide (LASIX) 80 MG tablet Take 1 tablet (80 mg total) by mouth 2 (two) times daily. 60 tablet 0  . gabapentin (NEURONTIN) 300 MG capsule TAKE 1 CAPSULE BY MOUTH THREE TIMES DAILY 90 capsule 0  . glucose blood (ACCU-CHEK AVIVA) test strip Check sugars TID for E11.65 (Patient not taking: Reported on 10/20/2017) 100 each 12  . hydrALAZINE (APRESOLINE) 25 MG tablet Take 4 tablets (100 mg total) by mouth 2 (two) times daily.    . Insulin Glargine (LANTUS SOLOSTAR) 100 UNIT/ML Solostar Pen Inject 12 Units into the skin daily at 10 pm. 5 pen PRN  .  Lancet Devices (ACCU-CHEK SOFTCLIX) lancets Check sugars TID for E11.65 (Patient not taking: Reported on 10/20/2017) 1 each 10  . methocarbamol (ROBAXIN) 500 MG tablet TAKE 1 TABLET BY MOUTH EVERY 6 HOURS AS NEEDED FOR MUSCLE SPASM 30 tablet 2  . metolazone (ZAROXOLYN) 2.5 MG tablet Take 2.5 mg by mouth daily.  5  . metoprolol succinate (TOPROL-XL) 50 MG 24 hr tablet Take 1 tablet (50 mg total) by mouth 2 (two) times daily. 60 tablet 0  . Multiple Vitamin (MULTIVITAMIN WITH MINERALS) TABS tablet Take 1 tablet by mouth daily.    [name] omeprazole (PRILOSEC) 20 MG capsule Take 20 mg by mouth daily before breakfast.     . oxyCODONE (ROXICODONE) 5 MG immediate release tablet Take 1 tablet (5 mg total) by mouth every 4 (four) hours as needed for severe pain. (Patient not taking: Reported on 11/25/2017) 10 tablet 0  . polyethylene glycol (MIRALAX / GLYCOLAX) packet Take 17 g by mouth 2 (two) times daily. 14 each 0  . sertraline (ZOLOFT) 100 MG tablet Take 1 tablet (100 mg total) by mouth daily. 30 tablet 6  . sodium bicarbonate 650 MG tablet Take 1,300 mg by mouth 3 (three) times daily.   3   No current facility-administered medications for this visit.      ROS:  See HPI  Physical Exam:  *** Incision:  *** Extremities:  *** Neuro: *** Abdomen:  ***  Assessment/Plan:  This is a 62 y.o. female who is s/p: 1st stage BVT on 10/26/17 by Dr. Bridgett Larsson  -***   Leontine Locket, PA-C [facility] [phone]  Clinic MD:  Bridgett Larsson

## 2017-12-10 ENCOUNTER — Ambulatory Visit: Payer: Medicare Other | Admitting: Physical Therapy

## 2017-12-10 ENCOUNTER — Encounter: Payer: Self-pay | Admitting: Physical Therapy

## 2017-12-10 ENCOUNTER — Encounter (HOSPITAL_COMMUNITY): Payer: Medicare Other

## 2017-12-10 DIAGNOSIS — M6281 Muscle weakness (generalized): Secondary | ICD-10-CM | POA: Diagnosis not present

## 2017-12-10 DIAGNOSIS — R2681 Unsteadiness on feet: Secondary | ICD-10-CM

## 2017-12-10 DIAGNOSIS — R2689 Other abnormalities of gait and mobility: Secondary | ICD-10-CM | POA: Diagnosis not present

## 2017-12-10 DIAGNOSIS — M6249 Contracture of muscle, multiple sites: Secondary | ICD-10-CM | POA: Diagnosis not present

## 2017-12-11 ENCOUNTER — Ambulatory Visit (HOSPITAL_COMMUNITY)
Admission: RE | Admit: 2017-12-11 | Discharge: 2017-12-11 | Disposition: A | Discharge status: Home / Self Care | Payer: Medicare Other | Source: Ambulatory Visit | Attending: Nephrology | Admitting: Nephrology

## 2017-12-11 VITALS — BP 152/68 | HR 62 | Resp 18

## 2017-12-11 DIAGNOSIS — D631 Anemia in chronic kidney disease: Secondary | ICD-10-CM | POA: Diagnosis not present

## 2017-12-11 DIAGNOSIS — N184 Chronic kidney disease, stage 4 (severe): Secondary | ICD-10-CM | POA: Diagnosis not present

## 2017-12-11 LAB — IRON AND TIBC
Iron: 46 ug/dL (ref 28–170)
Saturation Ratios: 15 % (ref 10.4–31.8)
TIBC: 311 ug/dL (ref 250–450)
UIBC: 265 ug/dL

## 2017-12-11 LAB — FERRITIN: Ferritin: 151 ng/mL (ref 11–307)

## 2017-12-11 LAB — POCT HEMOGLOBIN-HEMACUE: Hemoglobin: 10.5 g/dL — ABNORMAL LOW (ref 12.0–15.0)

## 2017-12-11 MED ORDER — DARBEPOETIN ALFA 150 MCG/0.3ML IJ SOSY
PREFILLED_SYRINGE | INTRAMUSCULAR | Status: AC
  Filled 2017-12-11: qty 0.3

## 2017-12-11 MED ORDER — DARBEPOETIN ALFA 150 MCG/0.3ML IJ SOSY
150.0000 ug | PREFILLED_SYRINGE | INTRAMUSCULAR | Status: DC
  Administered 2017-12-11: 150 ug via SUBCUTANEOUS

## 2017-12-11 NOTE — Therapy (Signed)
[facility] [address] [place], [place], [address] Phone: [phone]   Fax:  [phone]  Physical Therapy Treatment  Patient Details  Name: Brandy Houston MRN: [id number] Date of Birth: 02-Feb-1956 Referring Provider: Meridee Score, MD   Encounter Date: 12/10/2017  PT End of Session - 12/10/17 1023    Visit Number  16    Number of Visits  25    Date for PT Re-Evaluation  01/05/18    Authorization Type  Medicare & Medicaid    PT Start Time  1017    PT Stop Time  1055    PT Time Calculation (min)  38 min    Equipment Utilized During Treatment  Gait belt    Activity Tolerance  Patient tolerated treatment well;Patient limited by fatigue    Behavior During Therapy  [facility] for tasks assessed/performed       Past Medical History:  Diagnosis Date  . Anemia   . Anxiety   . Arthritis    "in my joints" (03/10/2017)  . Chronic diastolic CHF (congestive heart failure) ([place])   . CKD (chronic kidney disease), stage IV ([place])    stage IV. previous HD, none currently 10/30/15 (confirmed 02/05/2017 & 03/10/2017)  . Depression    Chronic  . Diabetic peripheral neuropathy ([place])   . Dysrhythmia    tachycardia, normal ECHO 08-09-14  . GERD (gastroesophageal reflux disease)   . Heart murmur     dx'd 02/05/2017  . Hepatitis C    "tx'd in 2016; I'm negative now" (02/05/2017)  . History of blood transfusion 2017   "w/knee replacement"  . Hypertension   . Osteoarthritis of left knee   . Peripheral neuropathy   . Protein calorie malnutrition ([place])   . Septic arthritis ([place])   . Slow transit constipation   . Type II diabetes mellitus ([name])    IDDM  . Uncontrolled hypertension 02/18/2013  . Unsteady gait     Past Surgical History:  Procedure Laterality Date  . AMPUTATION Left 03/20/2017   Procedure: LEFT ABOVE KNEE AMPUTATION;  Surgeon: Newt Minion, MD;  Location: [facility];  Service: Orthopedics;  Laterality: Left;  . APPENDECTOMY     . AV FISTULA PLACEMENT Left 09/13/2014   Procedure: Brachial Artery to Brachial Vein Gortex Four - Seven Stretch GRAFT INSERTION Left Forearm;  Surgeon: Mal Misty, MD;  Location: [facility];  Service: Vascular;  Laterality: Left;  . BASCILIC VEIN TRANSPOSITION Right 10/26/2017   Procedure: BASILIC VEIN TRANSPOSITION FIRST STAGE RIGHT ARM;  Surgeon: Conrad Pemberwick, MD;  Location: [facility];  Service: Vascular;  Laterality: Right;  . CHOLECYSTECTOMY OPEN    . COLON SURGERY    . EXCISIONAL TOTAL KNEE ARTHROPLASTY WITH ANTIBIOTIC SPACERS Left 02/06/2017   Procedure: Incisional total iknee with antibiotic spacer ;  Surgeon: Leandrew Koyanagi, MD;  Location: [facility];  Service: Orthopedics;  Laterality: Left;  . IR FLUORO GUIDE CV LINE RIGHT  02/10/2017  . IR REMOVAL TUN CV CATH W/O FL  04/01/2017  . IR US GUIDE VASC ACCESS RIGHT  02/10/2017  . IRRIGATION AND DEBRIDEMENT KNEE Left 03/12/2017   Procedure: IRRIGATION AND DEBRIDEMENT LEFT KNEE WITH WOUND VAC APPLICATION;  Surgeon: Leandrew Koyanagi, MD;  Location: [facility];  Service: Orthopedics;  Laterality: Left;  . JOINT REPLACEMENT    . KNEE ARTHROSCOPY Right 08/10/2014   Procedure: ARTHROSCOPY I & D KNEE;  Surgeon: Marianna Payment, MD;  Location: [facility];  Service:  Orthopedics;  Laterality: Right;  . KNEE ARTHROSCOPY Left 08/11/2014   Procedure: ARTHROSCOPIC WASHOUT LEFT KNEE;  Surgeon: Marianna Payment, MD;  Location: [facility];  Service: Orthopedics;  Laterality: Left;  . KNEE ARTHROSCOPY Left 08/19/2014   Procedure: ARTHROSCOPIC WASHOUT LEFT KNEE;  Surgeon: Leandrew Koyanagi, MD;  Location: [facility];  Service: Orthopedics;  Laterality: Left;  . KNEE ARTHROSCOPY WITH LATERAL MENISECTOMY Left 04/04/2015   Procedure: AND PARTIAL LATERAL MENISECTOMY;  Surgeon: Leandrew Koyanagi, MD;  Location: [facility];  Service: Orthopedics;  Laterality: Left;  . KNEE ARTHROSCOPY WITH MEDIAL MENISECTOMY Left 04/04/2015   Procedure: LEFT KNEE ARTHROSCOPY WITH PARTIAL MEDIAL MENISCECTOMY   AND SYNOVECTOMY;  Surgeon: Leandrew Koyanagi, MD;  Location: [facility];  Service: Orthopedics;  Laterality: Left;  . SHOULDER ARTHROSCOPY Bilateral 08/10/2014   Procedure: I & D BILATERAL SHOULDERS ;  Surgeon: Marianna Payment, MD;  Location: [facility];  Service: Orthopedics;  Laterality: Bilateral;  . SMALL INTESTINE SURGERY     Due to Small Bowel Obstruction; "fixed it when they did my gallbladder OR"  . TEE WITHOUT CARDIOVERSION N/A 08/14/2014   Procedure: TRANSESOPHAGEAL ECHOCARDIOGRAM (TEE);  Surgeon: Thayer Headings, MD;  Location: [facility];  Service: Cardiovascular;  Laterality: N/A;  . TENOSYNOVECTOMY Right 08/11/2014   Procedure: RIGHT WRIST IRRIGATION AND DEBRIDEMENT, TENOSYNOVECTOMY;  Surgeon: Marianna Payment, MD;  Location: [facility];  Service: Orthopedics;  Laterality: Right;  . TOTAL KNEE ARTHROPLASTY Left 11/07/2015   Procedure: LEFT TOTAL KNEE ARTHROPLASTY WITH REVISION OF IMPLANTS;  Surgeon: Leandrew Koyanagi, MD;  Location: [facility];  Service: Orthopedics;  Laterality: Left;  . TUBAL LIGATION      There were no vitals filed for this visit.  Subjective Assessment - 12/10/17 1022    Subjective  No new complaints. No falls or pain.     Pertinent History  left TFA, DM, neuropathy, Hep C, HTN, CHF, depression, CKD st 5 not dialysis, arthritis, heart murmur,     Limitations  Standing;Walking;House hold activities    Patient Stated Goals  To walk with prosthesis in home & community, travel plane, car & cruise in September    Currently in Pain?  No/denies    Pain Score  0-No pain           [facility] Adult PT Treatment/Exercise - 12/10/17 1024      Transfers   Transfers  Sit to Stand;Stand to Sit    Sit to Stand  4: Min guard;With upper extremity assist;With armrests;From chair/3-in-1;5: Supervision    Sit to Stand Details  Verbal cues for sequencing;Verbal cues for technique;Verbal cues for precautions/safety;Verbal cues for safe use of DME/AE;Visual cues for safe use of  DME/AE    Stand to Sit  4: Min guard;5: Supervision;With upper extremity assist;With armrests;To chair/3-in-1    Stand to Sit Details (indicate cue type and reason)  Verbal cues for precautions/safety;Verbal cues for safe use of DME/AE;Visual cues for safe use of DME/AE;Verbal cues for technique      Ambulation/Gait   Ambulation/Gait  Yes    Ambulation/Gait Assistance  4: Min guard;4: Min assist    Ambulation/Gait Assistance Details  cues needed on posture and prosthetic step placement (pt tends to adduct/center prosthesis with initial contact). with cues/slowing down pt was able to improve step placement- all of this with rollator. with forearm crutches: cues on posture, sequencing using 3 point gait pattern, step placement and weight shifting onto prosthesis in stance.  Ambulation Distance (Feet)  115 Feet x1 with rollator; 22 x4 with crutches    Assistive device  Rollator;Prosthesis    Gait Pattern  Step-through pattern;Decreased stance time - left;Decreased step length - right;Decreased hip/knee flexion - left;Decreased weight shift to left;Antalgic;Lateral hip instability;Trunk flexed;Abducted - left    Ambulation Surface  Level;Indoor    Stairs  Yes    Stairs Assistance  4: Min guard    Stairs Assistance Details (indicate cue type and reason)  cues needed on sequencing, weight shifting and to advance hand on rail     Stair Management Technique  One rail Right;One rail Left;Step to pattern;Forwards;With crutches    Number of Stairs  4 x2 reps      Prosthetics   Current prosthetic wear tolerance (days/week)   daily    Current prosthetic wear tolerance (#hours/day)   5-6 hrs 2x/day most days    Residual limb condition   intact per pt report.    Donning Prosthesis  Supervision    Doffing Prosthesis  Modified independent (device/increased time)          PT Short Term Goals - 12/04/17 2231      PT SHORT TERM GOAL #1   Title  Patient verbalizes initial understanding  of need to adjust ply socks with limb volume changes. (All STGs Target Date: 12/04/2017)    Baseline  MET 12/01/2017    Status  Achieved      PT SHORT TERM GOAL #2   Title  Patient tolerates prosthesis wear >10hrs total /day without skin issues.     Baseline  MET 12/01/2017    Status  Achieved      PT SHORT TERM GOAL #3   Title  Patient standing balance without UE support stands 2 minutes and reaches 2" anteriorly with supervision.     Baseline  MET 12/01/2017    Status  Achieved      PT SHORT TERM GOAL #4   Title  Patient ambulates 150' outdoors on paved surfaces with RW & prosthesis with supervision.     Baseline  12/03/17: pt met the distance, not the assistance level    Status  Partially Met      PT SHORT TERM GOAL #5   Title  Patient negotiates stairs with 1 rail/cane, ramp & curb with RW & prosthesis with supervision.     Baseline  MET 12/01/2017    Time  4    Period  Weeks    Status  Achieved        PT Long Term Goals - 10/22/17 1447      PT LONG TERM GOAL #1   Title  Patient verbalizes & demonstrates proper prosthetic care to enable safe use of prosthesis. (All LTGs Target Date: 01/01/2018)    Time  12    Period  Weeks    Status  On-going    Target Date  01/01/18      PT LONG TERM GOAL #2   Title  Patient tolerates prosthesis wear >80% of awake hours without skin issues or limb pain to enable function throughout her day.     Time  12    Period  Weeks    Status  On-going    Target Date  01/01/18      PT LONG TERM GOAL #3   Title  Patient ambulates 500' outdoors including grass with LRAD & prosthesis modified independent for community mobility.     Time  12    Period  Weeks    Status  On-going    Target Date  01/01/18      PT LONG TERM GOAL #4   Title  Patient negotiates ramps, curbs & stairs with LRAD & prosthesis modified independent to enable community mobility.     Time  12    Period  Weeks    Status  On-going    Target Date  01/01/18      PT LONG TERM  GOAL #5   Title  Berg Balance Test >/= 30/56    Time  12    Period  Weeks    Status  On-going    Target Date  01/01/18            Plan - 12/10/17 1023    Clinical Impression Statement  Today's skilled session continued focus on use of rollator and forearm crutches with gait. Pt continues to need cues for safe use of rollator and sequencing with crutches. Did have increased gait distances with both devices this session. Pt is progressing toward goals and should benefit from continued PT to progress toward unmet goals.     Rehab Potential  Good    Clinical Impairments Affecting Rehab Potential  transfemoral level amputation with multiple medical issues, significantly limited activity level for >2 years    PT Frequency  2x / week    PT Duration  12 weeks    PT Treatment/Interventions  ADLs/Self Care Home Management;Moist Heat;DME Instruction;Gait training;Stair training;Functional mobility training;Therapeutic activities;Therapeutic exercise;Balance training;Neuromuscular re-education;Patient/family education;Prosthetic Training;Manual techniques;Vestibular    PT Next Visit Plan  work on community prosthetic gait activities with forearm crutches & household mobility with rollator to transport items    Consulted and Agree with Plan of Care  Patient       Patient will benefit from skilled therapeutic intervention in order to improve the following deficits and impairments:  Abnormal gait, Decreased activity tolerance, Decreased balance, Decreased endurance, Decreased knowledge of use of DME, Decreased mobility, Decreased strength, Decreased range of motion, Impaired flexibility, Prosthetic Dependency, Postural dysfunction  Visit Diagnosis: Other abnormalities of gait and mobility  Muscle weakness (generalized)  Unsteadiness on feet     Problem List Patient Active Problem List   Diagnosis Date Noted  . Benign essential HTN   . Stage 5 chronic kidney disease not on chronic dialysis  ([place])   . Above knee amputation status, left ([place])   . DM type 2 causing CKD stage 5 ([place]) 03/21/2017  . Anemia of chronic disease 02/05/2017  . Dysplasia of cervix, high grade CIN 2 01/12/2017  . Chronic diastolic CHF (congestive heart failure) ([place]) 11/08/2015  . Total knee replacement status   . Diabetic peripheral neuropathy ([place])   . Major depression, recurrent, chronic ([place])   . Chronic kidney disease (CKD), stage IV (severe) ([place])   . Hepatitis C 08/16/2014  . Peripheral neuropathy 06/24/2013  . Compulsive tobacco user syndrome 06/24/2013    Willow Ora, PTA, [facility] [address], [place], [place] [address] [phone] 12/11/17, 2:17 PM   Name: Brandy Houston MRN: [id number] Date of Birth: 08/23/1955

## 2017-12-15 ENCOUNTER — Ambulatory Visit: Payer: Medicare Other | Attending: Orthopedic Surgery | Admitting: Physical Therapy

## 2017-12-15 ENCOUNTER — Encounter: Payer: Self-pay | Admitting: Physical Therapy

## 2017-12-15 DIAGNOSIS — M6281 Muscle weakness (generalized): Secondary | ICD-10-CM

## 2017-12-15 DIAGNOSIS — M6249 Contracture of muscle, multiple sites: Secondary | ICD-10-CM | POA: Insufficient documentation

## 2017-12-15 DIAGNOSIS — R2689 Other abnormalities of gait and mobility: Secondary | ICD-10-CM

## 2017-12-15 DIAGNOSIS — R2681 Unsteadiness on feet: Secondary | ICD-10-CM | POA: Diagnosis not present

## 2017-12-15 DIAGNOSIS — R262 Difficulty in walking, not elsewhere classified: Secondary | ICD-10-CM | POA: Insufficient documentation

## 2017-12-15 DIAGNOSIS — M79605 Pain in left leg: Secondary | ICD-10-CM | POA: Insufficient documentation

## 2017-12-15 DIAGNOSIS — R293 Abnormal posture: Secondary | ICD-10-CM | POA: Insufficient documentation

## 2017-12-16 DIAGNOSIS — N185 Chronic kidney disease, stage 5: Secondary | ICD-10-CM | POA: Diagnosis not present

## 2017-12-16 DIAGNOSIS — D631 Anemia in chronic kidney disease: Secondary | ICD-10-CM | POA: Diagnosis not present

## 2017-12-16 DIAGNOSIS — N2581 Secondary hyperparathyroidism of renal origin: Secondary | ICD-10-CM | POA: Diagnosis not present

## 2017-12-16 DIAGNOSIS — G629 Polyneuropathy, unspecified: Secondary | ICD-10-CM | POA: Diagnosis not present

## 2017-12-16 DIAGNOSIS — I770 Arteriovenous fistula, acquired: Secondary | ICD-10-CM | POA: Diagnosis not present

## 2017-12-16 DIAGNOSIS — N049 Nephrotic syndrome with unspecified morphologic changes: Secondary | ICD-10-CM | POA: Diagnosis not present

## 2017-12-16 DIAGNOSIS — I120 Hypertensive chronic kidney disease with stage 5 chronic kidney disease or end stage renal disease: Secondary | ICD-10-CM | POA: Diagnosis not present

## 2017-12-16 DIAGNOSIS — E1122 Type 2 diabetes mellitus with diabetic chronic kidney disease: Secondary | ICD-10-CM | POA: Diagnosis not present

## 2017-12-16 NOTE — Therapy (Signed)
[facility] [address] [place], [place], [address] Phone: [phone]   Fax:  [phone]  Physical Therapy Treatment  Patient Details  Name: Brandy Houston MRN: [id number] Date of Birth: 01-Aug-1955 Referring Provider: Meridee Score, MD   Encounter Date: 12/15/2017  PT End of Session - 12/15/17 1213    Visit Number  17    Number of Visits  25    Date for PT Re-Evaluation  01/05/18    Authorization Type  Medicare & Medicaid    PT Start Time  2952    PT Stop Time  1100    PT Time Calculation (min)  45 min    Equipment Utilized During Treatment  Gait belt    Activity Tolerance  Patient tolerated treatment well;Patient limited by fatigue    Behavior During Therapy  [facility] for tasks assessed/performed       Past Medical History:  Diagnosis Date  . Anemia   . Anxiety   . Arthritis    "in my joints" (03/10/2017)  . Chronic diastolic CHF (congestive heart failure) ([place])   . CKD (chronic kidney disease), stage IV ([place])    stage IV. previous HD, none currently 10/30/15 (confirmed 02/05/2017 & 03/10/2017)  . Depression    Chronic  . Diabetic peripheral neuropathy ([facility])   . Dysrhythmia    tachycardia, normal ECHO 08-09-14  . GERD (gastroesophageal reflux disease)   . Heart murmur     dx'd 02/05/2017  . Hepatitis C    "tx'd in 2016; I'm negative now" (02/05/2017)  . History of blood transfusion 2017   "w/knee replacement"  . Hypertension   . Osteoarthritis of left knee   . Peripheral neuropathy   . Protein calorie malnutrition ([place])   . Septic arthritis ([place])   . Slow transit constipation   . Type II diabetes mellitus ([name])    IDDM  . Uncontrolled hypertension 02/18/2013  . Unsteady gait     Past Surgical History:  Procedure Laterality Date  . AMPUTATION Left 03/20/2017   Procedure: LEFT ABOVE KNEE AMPUTATION;  Surgeon: Newt Minion, MD;  Location: [facility];  Service: Orthopedics;  Laterality: Left;  . APPENDECTOMY    .  AV FISTULA PLACEMENT Left 09/13/2014   Procedure: Brachial Artery to Brachial Vein Gortex Four - Seven Stretch GRAFT INSERTION Left Forearm;  Surgeon: Mal Misty, MD;  Location: [facility];  Service: Vascular;  Laterality: Left;  . BASCILIC VEIN TRANSPOSITION Right 10/26/2017   Procedure: BASILIC VEIN TRANSPOSITION FIRST STAGE RIGHT ARM;  Surgeon: Conrad Marshall, MD;  Location: [facility];  Service: Vascular;  Laterality: Right;  . CHOLECYSTECTOMY OPEN    . COLON SURGERY    . EXCISIONAL TOTAL KNEE ARTHROPLASTY WITH ANTIBIOTIC SPACERS Left 02/06/2017   Procedure: Incisional total iknee with antibiotic spacer ;  Surgeon: Leandrew Koyanagi, MD;  Location: [facility];  Service: Orthopedics;  Laterality: Left;  . IR FLUORO GUIDE CV LINE RIGHT  02/10/2017  . IR REMOVAL TUN CV CATH W/O FL  04/01/2017  . IR US GUIDE VASC ACCESS RIGHT  02/10/2017  . IRRIGATION AND DEBRIDEMENT KNEE Left 03/12/2017   Procedure: IRRIGATION AND DEBRIDEMENT LEFT KNEE WITH WOUND VAC APPLICATION;  Surgeon: Leandrew Koyanagi, MD;  Location: [facility];  Service: Orthopedics;  Laterality: Left;  . JOINT REPLACEMENT    . KNEE ARTHROSCOPY Right 08/10/2014   Procedure: ARTHROSCOPY I & D KNEE;  Surgeon: Marianna Payment, MD;  Location: [facility];  Service:  Orthopedics;  Laterality: Right;  . KNEE ARTHROSCOPY Left 08/11/2014   Procedure: ARTHROSCOPIC WASHOUT LEFT KNEE;  Surgeon: Marianna Payment, MD;  Location: [facility];  Service: Orthopedics;  Laterality: Left;  . KNEE ARTHROSCOPY Left 08/19/2014   Procedure: ARTHROSCOPIC WASHOUT LEFT KNEE;  Surgeon: Leandrew Koyanagi, MD;  Location: [facility];  Service: Orthopedics;  Laterality: Left;  . KNEE ARTHROSCOPY WITH LATERAL MENISECTOMY Left 04/04/2015   Procedure: AND PARTIAL LATERAL MENISECTOMY;  Surgeon: Leandrew Koyanagi, MD;  Location: [facility];  Service: Orthopedics;  Laterality: Left;  . KNEE ARTHROSCOPY WITH MEDIAL MENISECTOMY Left 04/04/2015   Procedure: LEFT KNEE ARTHROSCOPY WITH PARTIAL MEDIAL MENISCECTOMY  AND  SYNOVECTOMY;  Surgeon: Leandrew Koyanagi, MD;  Location: [facility];  Service: Orthopedics;  Laterality: Left;  . SHOULDER ARTHROSCOPY Bilateral 08/10/2014   Procedure: I & D BILATERAL SHOULDERS ;  Surgeon: Marianna Payment, MD;  Location: [facility];  Service: Orthopedics;  Laterality: Bilateral;  . SMALL INTESTINE SURGERY     Due to Small Bowel Obstruction; "fixed it when they did my gallbladder OR"  . TEE WITHOUT CARDIOVERSION N/A 08/14/2014   Procedure: TRANSESOPHAGEAL ECHOCARDIOGRAM (TEE);  Surgeon: Thayer Headings, MD;  Location: [facility];  Service: Cardiovascular;  Laterality: N/A;  . TENOSYNOVECTOMY Right 08/11/2014   Procedure: RIGHT WRIST IRRIGATION AND DEBRIDEMENT, TENOSYNOVECTOMY;  Surgeon: Marianna Payment, MD;  Location: [facility];  Service: Orthopedics;  Laterality: Right;  . TOTAL KNEE ARTHROPLASTY Left 11/07/2015   Procedure: LEFT TOTAL KNEE ARTHROPLASTY WITH REVISION OF IMPLANTS;  Surgeon: Leandrew Koyanagi, MD;  Location: [facility];  Service: Orthopedics;  Laterality: Left;  . TUBAL LIGATION      There were no vitals filed for this visit.  Subjective Assessment - 12/15/17 1024    Subjective  No falls. Using walker to walk in home.     Pertinent History  left TFA, DM, neuropathy, Hep C, HTN, CHF, depression, CKD st 5 not dialysis, arthritis, heart murmur,     Limitations  Standing;Walking;House hold activities    Patient Stated Goals  To walk with prosthesis in home & community, travel plane, car & cruise in September    Currently in Pain?  No/denies                       [facility] Adult PT Treatment/Exercise - 12/15/17 1015      Transfers   Transfers  Sit to Stand;Stand to [employer] Transfers    Sit to Stand  4: Min guard;With upper extremity assist;With armrests;From chair/3-in-1;5: Supervision to both rollator walker & to forearm crutches    Sit to Stand Details  Verbal cues for sequencing;Verbal cues for technique;Verbal cues for  precautions/safety;Verbal cues for safe use of DME/AE;Visual cues for safe use of DME/AE    Stand to Sit  4: Min guard;5: Supervision;With upper extremity assist;With armrests;To chair/3-in-1 from rollator walker & from forearm crutches    Stand to Sit Details (indicate cue type and reason)  Verbal cues for precautions/safety;Verbal cues for safe use of DME/AE;Visual cues for safe use of DME/AE;Verbal cues for technique    Stand Pivot Transfers  4: Min guard;4: Min assist turning 180* to sit / stand from rollator seat    Stand Pivot Transfer Details (indicate cue type and reason)  PT demo, instructed in technique. Pt needs to turn so prosthesis is away from seat for better clearance of prosthesis.  Rollator stabilized against wall for now.  Ambulation/Gait   Ambulation/Gait  Yes    Ambulation/Gait Assistance  4: Min guard;4: Min assist MIn guard with rollator, MinA forearm crutches    Ambulation/Gait Assistance Details  PT demo, instructed in safe use of rollator including position herself close to seat when ambulating.  Forearm crutches with 3 point pattern with verbal cues on width of crutches, placement of prosthesis & wt shift in stance.     Ambulation Distance (Feet)  130 Feet 130' x1 with rollator; 60' X 2 with crutches    Assistive device  Rollator;Prosthesis;Lofstrands    Gait Pattern  Step-through pattern;Decreased stance time - left;Decreased step length - right;Decreased hip/knee flexion - left;Decreased weight shift to left;Antalgic;Lateral hip instability;Trunk flexed;Abducted - left    Ambulation Surface  Indoor;Level    Stairs  Yes    Stairs Assistance  5: Supervision    Stairs Assistance Details (indicate cue type and reason)  verbal cues on technique with 1 crutch & 1 rail    Stair Management Technique  One rail Right;One rail Left;Step to pattern;Forwards;With crutches    Number of Stairs  4 x 3 reps varying location of rail    Ramp  2: Max assist;3: Mod assist forearm  crutches & TFA prosthesis, MaxA 1st, ModA 2nd    Ramp Details (indicate cue type and reason)  demo & verbal cues on technique with posture and wt shift    Curb  2: Max assist;3: Mod assist forearm crutches, MaxA 1st attempt, ModA 2nd time    Curb Details (indicate cue type and reason)  demo & verbal cues on technique with posture and wt shift      Prosthetics   Current prosthetic wear tolerance (days/week)   daily    Current prosthetic wear tolerance (#hours/day)   5-6 hrs 2x/day most days    Residual limb condition   intact per pt report.               PT Short Term Goals - 12/04/17 2231      PT SHORT TERM GOAL #1   Title  Patient verbalizes initial understanding of need to adjust ply socks with limb volume changes. (All STGs Target Date: 12/04/2017)    Baseline  MET 12/01/2017    Status  Achieved      PT SHORT TERM GOAL #2   Title  Patient tolerates prosthesis wear >10hrs total /day without skin issues.     Baseline  MET 12/01/2017    Status  Achieved      PT SHORT TERM GOAL #3   Title  Patient standing balance without UE support stands 2 minutes and reaches 2" anteriorly with supervision.     Baseline  MET 12/01/2017    Status  Achieved      PT SHORT TERM GOAL #4   Title  Patient ambulates 150' outdoors on paved surfaces with RW & prosthesis with supervision.     Baseline  12/03/17: pt met the distance, not the assistance level    Status  Partially Met      PT SHORT TERM GOAL #5   Title  Patient negotiates stairs with 1 rail/cane, ramp & curb with RW & prosthesis with supervision.     Baseline  MET 12/01/2017    Time  4    Period  Weeks    Status  Achieved        PT Long Term Goals - 10/22/17 1447      PT LONG TERM GOAL #1  Title  Patient verbalizes & demonstrates proper prosthetic care to enable safe use of prosthesis. (All LTGs Target Date: 01/01/2018)    Time  12    Period  Weeks    Status  On-going    Target Date  01/01/18      PT LONG TERM GOAL #2    Title  Patient tolerates prosthesis wear >80% of awake hours without skin issues or limb pain to enable function throughout her day.     Time  12    Period  Weeks    Status  On-going    Target Date  01/01/18      PT LONG TERM GOAL #3   Title  Patient ambulates 500' outdoors including grass with LRAD & prosthesis modified independent for community mobility.     Time  12    Period  Weeks    Status  On-going    Target Date  01/01/18      PT LONG TERM GOAL #4   Title  Patient negotiates ramps, curbs & stairs with LRAD & prosthesis modified independent to enable community mobility.     Time  12    Period  Weeks    Status  On-going    Target Date  01/01/18      PT LONG TERM GOAL #5   Title  Berg Balance Test >/= 30/56    Time  12    Period  Weeks    Status  On-going    Target Date  01/01/18            Plan - 12/16/17 1247    Clinical Impression Statement  Patient appears to be functioning with standard rolling walker & prosthesis but limited distances by vascular & medical issues causing activity tolerance limitations. She appears could increase her function with transfemoral prosthesis with rollator to transport items like laundry in home or enable rest with longer distances and forearm crutches for outdoors like grass / gravel.     Rehab Potential  Good    Clinical Impairments Affecting Rehab Potential  transfemoral level amputation with multiple medical issues, significantly limited activity level for >2 years    PT Frequency  2x / week    PT Duration  12 weeks    PT Treatment/Interventions  ADLs/Self Care Home Management;Moist Heat;DME Instruction;Gait training;Stair training;Functional mobility training;Therapeutic activities;Therapeutic exercise;Balance training;Neuromuscular re-education;Patient/family education;Prosthetic Training;Manual techniques;Vestibular    PT Next Visit Plan  work on community prosthetic gait activities with forearm crutches & household mobility with  rollator to transport items    Consulted and Agree with Plan of Care  Patient       Patient will benefit from skilled therapeutic intervention in order to improve the following deficits and impairments:  Abnormal gait, Decreased activity tolerance, Decreased balance, Decreased endurance, Decreased knowledge of use of DME, Decreased mobility, Decreased strength, Decreased range of motion, Impaired flexibility, Prosthetic Dependency, Postural dysfunction  Visit Diagnosis: Other abnormalities of gait and mobility  Muscle weakness (generalized)  Unsteadiness on feet     Problem List Patient Active Problem List   Diagnosis Date Noted  . Benign essential HTN   . Stage 5 chronic kidney disease not on chronic dialysis ([place])   . Above knee amputation status, left ([place])   . DM type 2 causing CKD stage 5 ([place]) 03/21/2017  . Anemia of chronic disease 02/05/2017  . Dysplasia of cervix, high grade CIN 2 01/12/2017  . Chronic diastolic CHF (congestive heart failure) ([place]) 11/08/2015  .  Total knee replacement status   . Diabetic peripheral neuropathy ([place])   . Major depression, recurrent, chronic ([place])   . Chronic kidney disease (CKD), stage IV (severe) ([place])   . Hepatitis C 08/16/2014  . Peripheral neuropathy 06/24/2013  . Compulsive tobacco user syndrome 06/24/2013    Jamey Reas PT, DPT 12/16/2017, 12:51 PM  [facility] [address] [place], [place], [address] Phone: [phone]   Fax:  [phone]  Name: Brandy Houston MRN: [id number] Date of Birth: 1955-10-19

## 2017-12-18 ENCOUNTER — Ambulatory Visit: Payer: Medicare Other | Admitting: Physical Therapy

## 2017-12-18 ENCOUNTER — Encounter: Payer: Self-pay | Admitting: Physical Therapy

## 2017-12-18 DIAGNOSIS — R262 Difficulty in walking, not elsewhere classified: Secondary | ICD-10-CM | POA: Diagnosis not present

## 2017-12-18 DIAGNOSIS — R2681 Unsteadiness on feet: Secondary | ICD-10-CM

## 2017-12-18 DIAGNOSIS — M6281 Muscle weakness (generalized): Secondary | ICD-10-CM | POA: Diagnosis not present

## 2017-12-18 DIAGNOSIS — R2689 Other abnormalities of gait and mobility: Secondary | ICD-10-CM

## 2017-12-18 DIAGNOSIS — R293 Abnormal posture: Secondary | ICD-10-CM | POA: Diagnosis not present

## 2017-12-18 DIAGNOSIS — M6249 Contracture of muscle, multiple sites: Secondary | ICD-10-CM | POA: Diagnosis not present

## 2017-12-18 NOTE — Therapy (Signed)
[facility] [address] [place], [place], [address] Phone: [phone]   Fax:  [phone]  Physical Therapy Treatment  Patient Details  Name: Brandy Houston MRN: [id number] Date of Birth: [birth year] Referring Provider: Meridee Score, MD   Encounter Date: 12/18/2017  PT End of Session - 12/18/17 1226    Visit Number  18    Number of Visits  25    Date for PT Re-Evaluation  01/05/18    Authorization Type  Medicare & Medicaid    PT Start Time  3810    PT Stop Time  1054   PT Time Calculation (min)  39 min    Equipment Utilized During Treatment  Gait belt    Activity Tolerance  Patient tolerated treatment well;Patient limited by fatigue    Behavior During Therapy  [facility] for tasks assessed/performed       Past Medical History:  Diagnosis Date  . Anemia   . Anxiety   . Arthritis    "in my joints" (03/10/2017)  . Chronic diastolic CHF (congestive heart failure) ([place])   . CKD (chronic kidney disease), stage IV ([place])    stage IV. previous HD, none currently 10/30/15 (confirmed 02/05/2017 & 03/10/2017)  . Depression    Chronic  . Diabetic peripheral neuropathy ([place])   . Dysrhythmia    tachycardia, normal ECHO 08-09-14  . GERD (gastroesophageal reflux disease)   . Heart murmur     dx'd 02/05/2017  . Hepatitis C    "tx'd in 2016; I'm negative now" (02/05/2017)  . History of blood transfusion 2017   "w/knee replacement"  . Hypertension   . Osteoarthritis of left knee   . Peripheral neuropathy   . Protein calorie malnutrition ([place])   . Septic arthritis ([place])   . Slow transit constipation   . Type II diabetes mellitus ([name])    IDDM  . Uncontrolled hypertension 02/18/2013  . Unsteady gait     Past Surgical History:  Procedure Laterality Date  . AMPUTATION Left 03/20/2017   Procedure: LEFT ABOVE KNEE AMPUTATION;  Surgeon: Newt Minion, MD;  Location: [facility];  Service: Orthopedics;  Laterality: Left;  . APPENDECTOMY    .  AV FISTULA PLACEMENT Left 09/13/2014   Procedure: Brachial Artery to Brachial Vein Gortex Four - Seven Stretch GRAFT INSERTION Left Forearm;  Surgeon: Mal Misty, MD;  Location: [facility];  Service: Vascular;  Laterality: Left;  . BASCILIC VEIN TRANSPOSITION Right 10/26/2017   Procedure: BASILIC VEIN TRANSPOSITION FIRST STAGE RIGHT ARM;  Surgeon: Conrad [place], MD;  Location: [facility];  Service: Vascular;  Laterality: Right;  . CHOLECYSTECTOMY OPEN    . COLON SURGERY    . EXCISIONAL TOTAL KNEE ARTHROPLASTY WITH ANTIBIOTIC SPACERS Left 02/06/2017   Procedure: Incisional total iknee with antibiotic spacer ;  Surgeon: Leandrew Koyanagi, MD;  Location: [facility];  Service: Orthopedics;  Laterality: Left;  . IR FLUORO GUIDE CV LINE RIGHT  02/10/2017  . IR REMOVAL TUN CV CATH W/O FL  04/01/2017  . IR US GUIDE VASC ACCESS RIGHT  02/10/2017  . IRRIGATION AND DEBRIDEMENT KNEE Left 03/12/2017   Procedure: IRRIGATION AND DEBRIDEMENT LEFT KNEE WITH WOUND VAC APPLICATION;  Surgeon: Leandrew Koyanagi, MD;  Location: [facility];  Service: Orthopedics;  Laterality: Left;  . JOINT REPLACEMENT    . KNEE ARTHROSCOPY Right 08/10/2014   Procedure: ARTHROSCOPY I & D KNEE;  Surgeon: Marianna Payment, MD;  [facility];  Service: Orthopedics;  Laterality: Right;  . KNEE ARTHROSCOPY Left 08/11/2014   Procedure: ARTHROSCOPIC WASHOUT LEFT KNEE;  Surgeon: Marianna Payment, MD;  Location: [facility];  Service: Orthopedics;  Laterality: Left;  . KNEE ARTHROSCOPY Left 08/19/2014   Procedure: ARTHROSCOPIC WASHOUT LEFT KNEE;  Surgeon: Leandrew Koyanagi, MD;  Location: [facility];  Service: Orthopedics;  Laterality: Left;  . KNEE ARTHROSCOPY WITH LATERAL MENISECTOMY Left 04/04/2015   Procedure: AND PARTIAL LATERAL MENISECTOMY;  Surgeon: Leandrew Koyanagi, MD;  Location: [facility];  Service: Orthopedics;  Laterality: Left;  . KNEE ARTHROSCOPY WITH MEDIAL MENISECTOMY Left 04/04/2015   Procedure: LEFT KNEE ARTHROSCOPY WITH PARTIAL MEDIAL MENISCECTOMY  AND  SYNOVECTOMY;  Surgeon: Leandrew Koyanagi, MD;  Location: [facility];  Service: Orthopedics;  Laterality: Left;  . SHOULDER ARTHROSCOPY Bilateral 08/10/2014   Procedure: I & D BILATERAL SHOULDERS ;  Surgeon: Marianna Payment, MD;  [facility];  Service: Orthopedics;  Laterality: Bilateral;  . SMALL INTESTINE SURGERY     Due to Small Bowel Obstruction; "fixed it when they did my gallbladder OR"  . TEE WITHOUT CARDIOVERSION N/A 08/14/2014   Procedure: TRANSESOPHAGEAL ECHOCARDIOGRAM (TEE);  Surgeon: Thayer Headings, MD;  Location: [facility];  Service: Cardiovascular;  Laterality: N/A;  . TENOSYNOVECTOMY Right 08/11/2014   Procedure: RIGHT WRIST IRRIGATION AND DEBRIDEMENT, TENOSYNOVECTOMY;  Surgeon: Marianna Payment, MD;  Location: [facility];  Service: Orthopedics;  Laterality: Right;  . TOTAL KNEE ARTHROPLASTY Left 11/07/2015   Procedure: LEFT TOTAL KNEE ARTHROPLASTY WITH REVISION OF IMPLANTS;  Surgeon: Leandrew Koyanagi, MD;  Location: [facility];  Service: Orthopedics;  Laterality: Left;  . TUBAL LIGATION      There were no vitals filed for this visit.  Subjective Assessment - 12/18/17 1018    Subjective  No falls. Still using walker to walk in home. Needs to get her home rollator brakes fixed befor she uses it.    Pertinent History  left TFA, DM, neuropathy, Hep C, HTN, CHF, depression, CKD st 5 not dialysis, arthritis, heart murmur,     Limitations  Standing;Walking;House hold activities    Patient Stated Goals  To walk with prosthesis in home & community, travel plane, car & cruise in September    Currently in Pain?  No/denies            [facility] Adult PT Treatment/Exercise - 12/18/17 0001      Transfers   Transfers  Sit to Stand;Stand Pivot Transfers    Sit to Stand  4: Min guard;From chair/3-in-1    Stand to Sit  4: Min guard;To chair/3-in-1;5: Supervision;With armrests;With upper extremity assist    Stand to Sit Details (indicate cue type and reason)  Verbal cues for  precautions/safety;Verbal cues for safe use of DME/AE    Stand Pivot Transfers  4: Min guard;4: Min assist;Other (comment) turning 180* from rollator to seat      Ambulation/Gait   Ambulation/Gait  Yes    Ambulation/Gait Assistance  4: Min guard;4: Min assist min guard with rollator and min assist with Forearm crutches    Ambulation/Gait Assistance Details  Forearm crutches 3 point pattern    Ambulation Distance (Feet)  345 Feet 230' x 2 with rollator and 115' x1 crutches with 2 bouts    Assistive device  Lofstrands;Rollator;Prosthesis    Gait Pattern  Step-through pattern;Decreased step length - right;Decreased step length - left;Decreased weight shift to left;Narrow base of support    Ambulation Surface  Level;Indoor  Gait Comments  pt needed assist to tightnen strap on prosthesis upon standing, needed verbal cues on increasing step length, increasing bos and looking ahead while ambulating       Prosthetics   Current prosthetic wear tolerance (days/week)   daily    Current prosthetic wear tolerance (#hours/day)   5-6 hrs 2x/day most days    Residual limb condition   intact per pt report.          PT Short Term Goals - 12/04/17 2231      PT SHORT TERM GOAL #1   Title  Patient verbalizes initial understanding of need to adjust ply socks with limb volume changes. (All STGs Target Date: 12/04/2017)    Baseline  MET 12/01/2017    Status  Achieved      PT SHORT TERM GOAL #2   Title  Patient tolerates prosthesis wear >10hrs total /day without skin issues.     Baseline  MET 12/01/2017    Status  Achieved      PT SHORT TERM GOAL #3   Title  Patient standing balance without UE support stands 2 minutes and reaches 2" anteriorly with supervision.     Baseline  MET 12/01/2017    Status  Achieved      PT SHORT TERM GOAL #4   Title  Patient ambulates 150' outdoors on paved surfaces with RW & prosthesis with supervision.     Baseline  12/03/17: pt met the distance, not the assistance level     Status  Partially Met      PT SHORT TERM GOAL #5   Title  Patient negotiates stairs with 1 rail/cane, ramp & curb with RW & prosthesis with supervision.     Baseline  MET 12/01/2017    Time  4    Period  Weeks    Status  Achieved        PT Long Term Goals - 10/22/17 1447      PT LONG TERM GOAL #1   Title  Patient verbalizes & demonstrates proper prosthetic care to enable safe use of prosthesis. (All LTGs Target Date: 01/01/2018)    Time  12    Period  Weeks    Status  On-going    Target Date  01/01/18      PT LONG TERM GOAL #2   Title  Patient tolerates prosthesis wear >80% of awake hours without skin issues or limb pain to enable function throughout her day.     Time  12    Period  Weeks    Status  On-going    Target Date  01/01/18      PT LONG TERM GOAL #3   Title  Patient ambulates 500' outdoors including grass with LRAD & prosthesis modified independent for community mobility.     Time  12    Period  Weeks    Status  On-going    Target Date  01/01/18      PT LONG TERM GOAL #4   Title  Patient negotiates ramps, curbs & stairs with LRAD & prosthesis modified independent to enable community mobility.     Time  12    Period  Weeks    Status  On-going    Target Date  01/01/18      PT LONG TERM GOAL #5   Title  Berg Balance Test >/= 30/56    Time  12    Period  Weeks    Status  On-going  Target Date  01/01/18            Plan - 12/18/17 1227    Clinical Impression Statement  Today's session focused on prosthetic training with forearm crutches and rollator. Pt did well with rollator and is still limited by fatique with foearm crutches. Pt would benefit from contiuned PT in order to progress toward unmet goals.     Clinical Impairments Affecting Rehab Potential  transfemoral level amputation with multiple medical issues, significantly limited activity level for >2 years    PT Frequency  2x / week    PT Duration  12 weeks    PT Treatment/Interventions   ADLs/Self Care Home Management;Moist Heat;DME Instruction;Gait training;Stair training;Functional mobility training;Therapeutic activities;Therapeutic exercise;Balance training;Neuromuscular re-education;Patient/family education;Prosthetic Training;Manual techniques;Vestibular    PT Next Visit Plan  work on community prosthetic gait activities with forearm crutches & household mobility with rollator to transport items.     Consulted and Agree with Plan of Care  Patient       Patient will benefit from skilled therapeutic intervention in order to improve the following deficits and impairments:  Abnormal gait, Decreased activity tolerance, Decreased balance, Decreased endurance, Decreased knowledge of use of DME, Decreased mobility, Decreased strength, Decreased range of motion, Impaired flexibility, Prosthetic Dependency, Postural dysfunction  Visit Diagnosis: Other abnormalities of gait and mobility  Muscle weakness (generalized)  Unsteadiness on feet     Problem List Patient Active Problem List   Diagnosis Date Noted  . Benign essential HTN   . Stage 5 chronic kidney disease not on chronic dialysis ([place])   . Above knee amputation status, left ([place])   . DM type 2 causing CKD stage 5 ([place]) 03/21/2017  . Anemia of chronic disease 02/05/2017  . Dysplasia of cervix, high grade CIN 2 01/12/2017  . Chronic diastolic CHF (congestive heart failure) ([place]) 11/08/2015  . Total knee replacement status   . Diabetic peripheral neuropathy ([place])   . Major depression, recurrent, chronic ([place])   . Chronic kidney disease (CKD), stage IV (severe) ([place])   . Hepatitis C 08/16/2014  . Peripheral neuropathy 06/24/2013  . Compulsive tobacco user syndrome 06/24/2013   Halina Andreas, [place] 12/18/2017, 4:15 PM  [facility] [address] [place], [place], [address] Phone: [phone]   Fax:  [phone]  Name: Brandy Houston MRN: [id number] Date  of Birth: April 26, 1956

## 2017-12-22 ENCOUNTER — Ambulatory Visit: Payer: Medicare Other | Admitting: Physical Therapy

## 2017-12-22 ENCOUNTER — Encounter: Payer: Self-pay | Admitting: Physical Therapy

## 2017-12-22 DIAGNOSIS — M6281 Muscle weakness (generalized): Secondary | ICD-10-CM | POA: Diagnosis not present

## 2017-12-22 DIAGNOSIS — R2681 Unsteadiness on feet: Secondary | ICD-10-CM

## 2017-12-22 DIAGNOSIS — R2689 Other abnormalities of gait and mobility: Secondary | ICD-10-CM | POA: Diagnosis not present

## 2017-12-22 DIAGNOSIS — M6249 Contracture of muscle, multiple sites: Secondary | ICD-10-CM | POA: Diagnosis not present

## 2017-12-22 DIAGNOSIS — R262 Difficulty in walking, not elsewhere classified: Secondary | ICD-10-CM | POA: Diagnosis not present

## 2017-12-22 DIAGNOSIS — R293 Abnormal posture: Secondary | ICD-10-CM | POA: Diagnosis not present

## 2017-12-22 NOTE — Therapy (Signed)
[facility] [address] [place], [place], [address] Phone: [phone]   Fax:  [phone]  Physical Therapy Treatment  Patient Details  Name: Brandy Houston MRN: [id number] Date of Birth: [birth year] Referring Provider: Meridee Score, MD   Encounter Date: 12/22/2017  PT End of Session - 12/22/17 1104    Visit Number  19    Number of Visits  25    Date for PT Re-Evaluation  01/05/18    Authorization Type  Medicare & Medicaid    PT Start Time  1017    PT Stop Time  1100    PT Time Calculation (min)  43 min    Equipment Utilized During Treatment  Gait belt    Activity Tolerance  Patient tolerated treatment well;Patient limited by fatigue    Behavior During Therapy  [facility] for tasks assessed/performed       Past Medical History:  Diagnosis Date  . Anemia   . Anxiety   . Arthritis    "in my joints" (03/10/2017)  . Chronic diastolic CHF (congestive heart failure) ([place])   . CKD (chronic kidney disease), stage IV ([place])    stage IV. previous HD, none currently 10/30/15 (confirmed 02/05/2017 & 03/10/2017)  . Depression    Chronic  . Diabetic peripheral neuropathy ([place])   . Dysrhythmia    tachycardia, normal ECHO 08-09-14  . GERD (gastroesophageal reflux disease)   . Heart murmur     dx'd 02/05/2017  . Hepatitis C    "tx'd in 2016; I'm negative now" (02/05/2017)  . History of blood transfusion 2017   "w/knee replacement"  . Hypertension   . Osteoarthritis of left knee   . Peripheral neuropathy   . Protein calorie malnutrition ([place])   . Septic arthritis ([place])   . Slow transit constipation   . Type II diabetes mellitus ([name])    IDDM  . Uncontrolled hypertension 02/18/2013  . Unsteady gait     Past Surgical History:  Procedure Laterality Date  . AMPUTATION Left 03/20/2017   Procedure: LEFT ABOVE KNEE AMPUTATION;  Surgeon: Newt Minion, MD;  Location: [facility];  Service: Orthopedics;  Laterality: Left;  . APPENDECTOMY    .  AV FISTULA PLACEMENT Left 09/13/2014   Procedure: Brachial Artery to Brachial Vein Gortex Four - Seven Stretch GRAFT INSERTION Left Forearm;  Surgeon: Mal Misty, MD;  Location: [facility];  Service: Vascular;  Laterality: Left;  . BASCILIC VEIN TRANSPOSITION Right 10/26/2017   Procedure: BASILIC VEIN TRANSPOSITION FIRST STAGE RIGHT ARM;  Surgeon: Conrad [place], MD;  Location: [facility];  Service: Vascular;  Laterality: Right;  . CHOLECYSTECTOMY OPEN    . COLON SURGERY    . EXCISIONAL TOTAL KNEE ARTHROPLASTY WITH ANTIBIOTIC SPACERS Left 02/06/2017   Procedure: Incisional total iknee with antibiotic spacer ;  Surgeon: Leandrew Koyanagi, MD;  Location: [facility];  Service: Orthopedics;  Laterality: Left;  . IR FLUORO GUIDE CV LINE RIGHT  02/10/2017  . IR REMOVAL TUN CV CATH W/O FL  04/01/2017  . IR US GUIDE VASC ACCESS RIGHT  02/10/2017  . IRRIGATION AND DEBRIDEMENT KNEE Left 03/12/2017   Procedure: IRRIGATION AND DEBRIDEMENT LEFT KNEE WITH WOUND VAC APPLICATION;  Surgeon: Leandrew Koyanagi, MD;  Location: [facility];  Service: Orthopedics;  Laterality: Left;  . JOINT REPLACEMENT    . KNEE ARTHROSCOPY Right 08/10/2014   Procedure: ARTHROSCOPY I & D KNEE;  Surgeon: Marianna Payment, MD;  [facility];  Service:  Orthopedics;  Laterality: Right;  . KNEE ARTHROSCOPY Left 08/11/2014   Procedure: ARTHROSCOPIC WASHOUT LEFT KNEE;  Surgeon: Marianna Payment, MD;  Location: [facility];  Service: Orthopedics;  Laterality: Left;  . KNEE ARTHROSCOPY Left 08/19/2014   Procedure: ARTHROSCOPIC WASHOUT LEFT KNEE;  Surgeon: Leandrew Koyanagi, MD;  Location: [facility];  Service: Orthopedics;  Laterality: Left;  . KNEE ARTHROSCOPY WITH LATERAL MENISECTOMY Left 04/04/2015   Procedure: AND PARTIAL LATERAL MENISECTOMY;  Surgeon: Leandrew Koyanagi, MD;  Location: [facility];  Service: Orthopedics;  Laterality: Left;  . KNEE ARTHROSCOPY WITH MEDIAL MENISECTOMY Left 04/04/2015   Procedure: LEFT KNEE ARTHROSCOPY WITH PARTIAL MEDIAL MENISCECTOMY  AND  SYNOVECTOMY;  Surgeon: Leandrew Koyanagi, MD;  Location: [facility];  Service: Orthopedics;  Laterality: Left;  . SHOULDER ARTHROSCOPY Bilateral 08/10/2014   Procedure: I & D BILATERAL SHOULDERS ;  Surgeon: Marianna Payment, MD;  [facility];  Service: Orthopedics;  Laterality: Bilateral;  . SMALL INTESTINE SURGERY     Due to Small Bowel Obstruction; "fixed it when they did my gallbladder OR"  . TEE WITHOUT CARDIOVERSION N/A 08/14/2014   Procedure: TRANSESOPHAGEAL ECHOCARDIOGRAM (TEE);  Surgeon: Thayer Headings, MD;  Location: [facility];  Service: Cardiovascular;  Laterality: N/A;  . TENOSYNOVECTOMY Right 08/11/2014   Procedure: RIGHT WRIST IRRIGATION AND DEBRIDEMENT, TENOSYNOVECTOMY;  Surgeon: Marianna Payment, MD;  Location: [facility];  Service: Orthopedics;  Laterality: Right;  . TOTAL KNEE ARTHROPLASTY Left 11/07/2015   Procedure: LEFT TOTAL KNEE ARTHROPLASTY WITH REVISION OF IMPLANTS;  Surgeon: Leandrew Koyanagi, MD;  Location: [facility];  Service: Orthopedics;  Laterality: Left;  . TUBAL LIGATION      There were no vitals filed for this visit.  Subjective Assessment - 12/22/17 1020    Subjective  No falls. Still using walker to walk in home. Asked PTA about when she could get socket revison.     Pertinent History  left TFA, DM, neuropathy, Hep C, HTN, CHF, depression, CKD st 5 not dialysis, arthritis, heart murmur,     Limitations  Standing;Walking;House hold activities    Patient Stated Goals  To walk with prosthesis in home & community, travel plane, car & cruise in September    Pain Score  0-No pain          Prosthetics Assessment - 12/22/17 1113      Prosthetics   Donning prosthesis   Supervision    Doffing prosthesis   Supervision    Current prosthetic wear tolerance (days/week)   daily    Current prosthetic wear tolerance (#hours/day)   5-6 hrs 2x/day most days    Residual limb condition   intact          [facility] Adult PT Treatment/Exercise - 12/22/17 1113       Transfers   Transfers  Sit to Stand;Stand to Sit    Sit to Stand  4: Min guard;From chair/3-in-1;4: Min assist    Sit to Stand Details  Verbal cues for safe use of DME/AE    Stand to Sit  4: Min assist;To chair/3-in-1;With upper extremity assist    Stand to Sit Details (indicate cue type and reason)  Verbal cues for precautions/safety;Verbal cues for safe use of DME/AE    Stand Pivot Transfers  4: Min guard;4: Min assist turning 90* and 180* to [facility]      Ambulation/Gait   Ambulation/Gait  Yes    Ambulation/Gait Assistance  4: Min guard;4: Min assist  Ambulation Distance (Feet)  510 Feet 115' x 2 with Rollator and 70' x 4 with forearm crutches    Assistive device  Rollator;Lofstrands;Prosthesis    Gait Pattern  Step-through pattern;Decreased step length - right;Decreased step length - left;Decreased weight shift to left;Narrow base of support    Ambulation Surface  Level;Indoor    Gait Comments  pt needed assist to tightnen strap on prosthesis upon standing, needed verbal cues on increasing step length, increasing bos and maintaining upright posture.  Pt also needing frequent rest breaks with forearm crutches due to fatigue.        PT Short Term Goals - 12/04/17 2231      PT SHORT TERM GOAL #1   Title  Patient verbalizes initial understanding of need to adjust ply socks with limb volume changes. (All STGs Target Date: 12/04/2017)    Baseline  MET 12/01/2017    Status  Achieved      PT SHORT TERM GOAL #2   Title  Patient tolerates prosthesis wear >10hrs total /day without skin issues.     Baseline  MET 12/01/2017    Status  Achieved      PT SHORT TERM GOAL #3   Title  Patient standing balance without UE support stands 2 minutes and reaches 2" anteriorly with supervision.     Baseline  MET 12/01/2017    Status  Achieved      PT SHORT TERM GOAL #4   Title  Patient ambulates 150' outdoors on paved surfaces with RW & prosthesis with supervision.     Baseline  12/03/17: pt met the  distance, not the assistance level    Status  Partially Met      PT SHORT TERM GOAL #5   Title  Patient negotiates stairs with 1 rail/cane, ramp & curb with RW & prosthesis with supervision.     Baseline  MET 12/01/2017    Time  4    Period  Weeks    Status  Achieved        PT Long Term Goals - 10/22/17 1447      PT LONG TERM GOAL #1   Title  Patient verbalizes & demonstrates proper prosthetic care to enable safe use of prosthesis. (All LTGs Target Date: 01/01/2018)    Time  12    Period  Weeks    Status  On-going    Target Date  01/01/18      PT LONG TERM GOAL #2   Title  Patient tolerates prosthesis wear >80% of awake hours without skin issues or limb pain to enable function throughout her day.     Time  12    Period  Weeks    Status  On-going    Target Date  01/01/18      PT LONG TERM GOAL #3   Title  Patient ambulates 500' outdoors including grass with LRAD & prosthesis modified independent for community mobility.     Time  12    Period  Weeks    Status  On-going    Target Date  01/01/18      PT LONG TERM GOAL #4   Title  Patient negotiates ramps, curbs & stairs with LRAD & prosthesis modified independent to enable community mobility.     Time  12    Period  Weeks    Status  On-going    Target Date  01/01/18      PT LONG TERM GOAL #5   Title  Merrilee Jansky  Balance Test >/= 30/56    Time  12    Period  Weeks    Status  On-going    Target Date  01/01/18            Plan - 12/22/17 1110    Clinical Impression Statement  Today's skilled session focused on prosthetic training with rollator and forearm crutchs. Pt is able to go 115 ft without rest break with rollator, but still requires breaks with forearm crutches. During first bout of gt training with crutches, pt c/o socket feeling loose. SPTA and PTA assisted pt with applying additional sock to allow better fit of prosthesis. Pt is progressing toward goals and would benefit from coninued PT in order to meet unmet  goals.    Clinical Impairments Affecting Rehab Potential  transfemoral level amputation with multiple medical issues, significantly limited activity level for >2 years    PT Frequency  2x / week    PT Duration  12 weeks    PT Treatment/Interventions  ADLs/Self Care Home Management;Moist Heat;DME Instruction;Gait training;Stair training;Functional mobility training;Therapeutic activities;Therapeutic exercise;Balance training;Neuromuscular re-education;Patient/family education;Prosthetic Training;Manual techniques;Vestibular    PT Next Visit Plan  continue to work on community prosthetic gait activities with forearm crutches & household mobility with rollator to transport items. Weight shifting exs, balance.     Consulted and Agree with Plan of Care  Patient       Patient will benefit from skilled therapeutic intervention in order to improve the following deficits and impairments:  Abnormal gait, Decreased activity tolerance, Decreased balance, Decreased endurance, Decreased knowledge of use of DME, Decreased mobility, Decreased strength, Decreased range of motion, Impaired flexibility, Prosthetic Dependency, Postural dysfunction  Visit Diagnosis: Other abnormalities of gait and mobility  Muscle weakness (generalized)  Unsteadiness on feet     Problem List Patient Active Problem List   Diagnosis Date Noted  . Benign essential HTN   . Stage 5 chronic kidney disease not on chronic dialysis ([place])   . Above knee amputation status, left ([place])   . DM type 2 causing CKD stage 5 ([place]) 03/21/2017  . Anemia of chronic disease 02/05/2017  . Dysplasia of cervix, high grade CIN 2 01/12/2017  . Chronic diastolic CHF (congestive heart failure) ([place]) 11/08/2015  . Total knee replacement status   . Diabetic peripheral neuropathy ([place])   . Major depression, recurrent, chronic ([place])   . Chronic kidney disease (CKD), stage IV (severe) ([place])   . Hepatitis C 08/16/2014  . Peripheral neuropathy  06/24/2013  . Compulsive tobacco user syndrome 06/24/2013   Halina Andreas, SPTA 12/23/2017, 11:19 AM  [facility] [address] [place], [place], [address] Phone: [phone]   Fax:  [phone]  Name: Brandy Houston MRN: [id number] Date of Birth: [birth year]

## 2017-12-24 ENCOUNTER — Ambulatory Visit: Payer: Medicare Other | Admitting: Physical Therapy

## 2017-12-24 ENCOUNTER — Encounter: Payer: Self-pay | Admitting: Physical Therapy

## 2017-12-24 DIAGNOSIS — M6281 Muscle weakness (generalized): Secondary | ICD-10-CM

## 2017-12-24 DIAGNOSIS — R2689 Other abnormalities of gait and mobility: Secondary | ICD-10-CM

## 2017-12-24 DIAGNOSIS — R293 Abnormal posture: Secondary | ICD-10-CM | POA: Diagnosis not present

## 2017-12-24 DIAGNOSIS — R262 Difficulty in walking, not elsewhere classified: Secondary | ICD-10-CM | POA: Diagnosis not present

## 2017-12-24 DIAGNOSIS — R2681 Unsteadiness on feet: Secondary | ICD-10-CM | POA: Diagnosis not present

## 2017-12-24 DIAGNOSIS — M6249 Contracture of muscle, multiple sites: Secondary | ICD-10-CM | POA: Diagnosis not present

## 2017-12-24 NOTE — Therapy (Signed)
[facility] [address] [place], [place], [address] Phone: [phone]   Fax:  [phone]  Physical Therapy Treatment  Patient Details  Name: Brandy Houston MRN: [id number] Date of Birth: 1955-09-24 Referring Provider: Meridee Score, MD   Encounter Date: 12/24/2017  PT End of Session - 12/24/17 1107    Visit Number  20    Number of Visits  25    Date for PT Re-Evaluation  01/05/18    Authorization Type  Medicare & Medicaid    PT Start Time  9509    PT Stop Time  1100    PT Time Calculation (min)  45 min    Equipment Utilized During Treatment  Gait belt    Activity Tolerance  Patient tolerated treatment well;Patient limited by fatigue    Behavior During Therapy  [facility] for tasks assessed/performed       Past Medical History:  Diagnosis Date  . Anemia   . Anxiety   . Arthritis    "in my joints" (03/10/2017)  . Chronic diastolic CHF (congestive heart failure) ([place])   . CKD (chronic kidney disease), stage IV ([place])    stage IV. previous HD, none currently 10/30/15 (confirmed 02/05/2017 & 03/10/2017)  . Depression    Chronic  . Diabetic peripheral neuropathy ([place])   . Dysrhythmia    tachycardia, normal ECHO 08-09-14  . GERD (gastroesophageal reflux disease)   . Heart murmur     dx'd 02/05/2017  . Hepatitis C    "tx'd in 2016; I'm negative now" (02/05/2017)  . History of blood transfusion 2017   "w/knee replacement"  . Hypertension   . Osteoarthritis of left knee   . Peripheral neuropathy   . Protein calorie malnutrition ([place])   . Septic arthritis ([place])   . Slow transit constipation   . Type II diabetes mellitus ([name])    IDDM  . Uncontrolled hypertension 02/18/2013  . Unsteady gait     Past Surgical History:  Procedure Laterality Date  . AMPUTATION Left 03/20/2017   Procedure: LEFT ABOVE KNEE AMPUTATION;  Surgeon: Newt Minion, MD;  Location: [facility];  Service: Orthopedics;  Laterality: Left;  . APPENDECTOMY     . AV FISTULA PLACEMENT Left 09/13/2014   Procedure: Brachial Artery to Brachial Vein Gortex Four - Seven Stretch GRAFT INSERTION Left Forearm;  Surgeon: Mal Misty, MD;  Location: [facility];  Service: Vascular;  Laterality: Left;  . BASCILIC VEIN TRANSPOSITION Right 10/26/2017   Procedure: BASILIC VEIN TRANSPOSITION FIRST STAGE RIGHT ARM;  Surgeon: Conrad McLennan, MD;  Location: [facility];  Service: Vascular;  Laterality: Right;  . CHOLECYSTECTOMY OPEN    . COLON SURGERY    . EXCISIONAL TOTAL KNEE ARTHROPLASTY WITH ANTIBIOTIC SPACERS Left 02/06/2017   Procedure: Incisional total iknee with antibiotic spacer ;  Surgeon: Leandrew Koyanagi, MD;  Location: [facility];  Service: Orthopedics;  Laterality: Left;  . IR FLUORO GUIDE CV LINE RIGHT  02/10/2017  . IR REMOVAL TUN CV CATH W/O FL  04/01/2017  . IR US GUIDE VASC ACCESS RIGHT  02/10/2017  . IRRIGATION AND DEBRIDEMENT KNEE Left 03/12/2017   Procedure: IRRIGATION AND DEBRIDEMENT LEFT KNEE WITH WOUND VAC APPLICATION;  Surgeon: Leandrew Koyanagi, MD;  Location: [facility];  Service: Orthopedics;  Laterality: Left;  . JOINT REPLACEMENT    . KNEE ARTHROSCOPY Right 08/10/2014   Procedure: ARTHROSCOPY I & D KNEE;  Surgeon: Marianna Payment, MD;  Location: [facility];  Service:  Orthopedics;  Laterality: Right;  . KNEE ARTHROSCOPY Left 08/11/2014   Procedure: ARTHROSCOPIC WASHOUT LEFT KNEE;  Surgeon: Marianna Payment, MD;  Location: [facility];  Service: Orthopedics;  Laterality: Left;  . KNEE ARTHROSCOPY Left 08/19/2014   Procedure: ARTHROSCOPIC WASHOUT LEFT KNEE;  Surgeon: Leandrew Koyanagi, MD;  Location: [facility];  Service: Orthopedics;  Laterality: Left;  . KNEE ARTHROSCOPY WITH LATERAL MENISECTOMY Left 04/04/2015   Procedure: AND PARTIAL LATERAL MENISECTOMY;  Surgeon: Leandrew Koyanagi, MD;  Location: [facility];  Service: Orthopedics;  Laterality: Left;  . KNEE ARTHROSCOPY WITH MEDIAL MENISECTOMY Left 04/04/2015   Procedure: LEFT KNEE ARTHROSCOPY WITH PARTIAL MEDIAL MENISCECTOMY   AND SYNOVECTOMY;  Surgeon: Leandrew Koyanagi, MD;  Location: [facility];  Service: Orthopedics;  Laterality: Left;  . SHOULDER ARTHROSCOPY Bilateral 08/10/2014   Procedure: I & D BILATERAL SHOULDERS ;  Surgeon: Marianna Payment, MD;  Location: [facility];  Service: Orthopedics;  Laterality: Bilateral;  . SMALL INTESTINE SURGERY     Due to Small Bowel Obstruction; "fixed it when they did my gallbladder OR"  . TEE WITHOUT CARDIOVERSION N/A 08/14/2014   Procedure: TRANSESOPHAGEAL ECHOCARDIOGRAM (TEE);  Surgeon: Thayer Headings, MD;  Location: [facility];  Service: Cardiovascular;  Laterality: N/A;  . TENOSYNOVECTOMY Right 08/11/2014   Procedure: RIGHT WRIST IRRIGATION AND DEBRIDEMENT, TENOSYNOVECTOMY;  Surgeon: Marianna Payment, MD;  Location: [facility];  Service: Orthopedics;  Laterality: Right;  . TOTAL KNEE ARTHROPLASTY Left 11/07/2015   Procedure: LEFT TOTAL KNEE ARTHROPLASTY WITH REVISION OF IMPLANTS;  Surgeon: Leandrew Koyanagi, MD;  Location: [facility];  Service: Orthopedics;  Laterality: Left;  . TUBAL LIGATION      There were no vitals filed for this visit.  Subjective Assessment - 12/24/17 1018    Subjective  No falls. She has been walking some at home with walker.     Pertinent History  left TFA, DM, neuropathy, Hep C, HTN, CHF, depression, CKD st 5 not dialysis, arthritis, heart murmur,     Limitations  Standing;Walking;House hold activities    Patient Stated Goals  To walk with prosthesis in home & community, travel plane, car & cruise in September    Currently in Pain?  No/denies                       [facility] Adult PT Treatment/Exercise - 12/24/17 1015      Transfers   Transfers  Sit to Stand;Stand to Sit;Stand Pivot Transfers    Sit to Stand  5: Supervision;From chair/3-in-1;With upper extremity assist;With armrests to rollator walker & to forearm crutches    Sit to Stand Details  Verbal cues for safe use of DME/AE    Stand to Sit  5: Supervision;With upper  extremity assist;With armrests;To chair/3-in-1 from rollator walker & from forearm crutches    Stand to Sit Details (indicate cue type and reason)  Verbal cues for precautions/safety;Verbal cues for safe use of DME/AE    Stand Pivot Transfers  4: Min guard;5: Supervision turning 180* to sit/stand rollator seat.    Comments  Pt transfered into truck with high seat using 8" step stool safely using UEs on door & car seat/ dashboard.       Ambulation/Gait   Ambulation/Gait  Yes    Ambulation/Gait Assistance  4: Min guard;4: Min assist;5: Supervision supervision w/ rollator walker & minA-guard with crutches    Ambulation/Gait Assistance Details  verbal & tactile cues on upright posture,  prosthetic knee control & weight shift over prosthesis in stance.      Ambulation Distance (Feet)  250 Feet 150' x 2 & 250' rollator walker,  38' X 2 forearm crutches    Assistive device  Rollator;Lofstrands;Prosthesis    Gait Pattern  Step-through pattern;Decreased step length - right;Decreased step length - left;Decreased weight shift to left;Narrow base of support    Ambulation Surface  Level;Indoor;Outdoor;Paved;Gravel;Grass crutches 10' gravel, 10' grass, 10' mulch    Stairs  Yes    Stairs Assistance  5: Supervision    Stairs Assistance Details (indicate cue type and reason)  verbal cues on crutch placement and wt shift over prosthesis in stance.     Stair Management Technique  One rail Right;One rail Left;With crutches;Step to pattern;Forwards    Number of Stairs  4 2 reps    Ramp  4: Min assist 1 rep indoor ramp crutches, 2 reps outdoor curb cut rollator    Ramp Details (indicate cue type and reason)  verbal cues on posture, rollator control & wt shift over prosthesis    Curb  4: Min assist 1 rep indoor crutches & 1 rep outdoor rollator    Curb Details (indicate cue type and reason)  verbal cues on posture, rollator control & wt shift over prosthesis      Prosthetics   Prosthetic Care Comments   prosthesis  is ~1/2" too short. appointment tomorrow, 7/12 with prosthetist to lengthen & alignment check.     Current prosthetic wear tolerance (days/week)   daily    Current prosthetic wear tolerance (#hours/day)   5-6 hrs 2x/day most days    Residual limb condition   intact    Education Provided  Proper Donning;Proper wear schedule/adjustment;Other (comment) see prosthetic care comments    Person(s) Educated  Patient    Education Method  Explanation;Verbal cues    Education Method  Verbalized understanding;Verbal cues required;Needs further instruction    Donning Prosthesis  Supervision    Doffing Prosthesis  Modified independent (device/increased time)               PT Short Term Goals - 12/04/17 2231      PT SHORT TERM GOAL #1   Title  Patient verbalizes initial understanding of need to adjust ply socks with limb volume changes. (All STGs Target Date: 12/04/2017)    Baseline  MET 12/01/2017    Status  Achieved      PT SHORT TERM GOAL #2   Title  Patient tolerates prosthesis wear >10hrs total /day without skin issues.     Baseline  MET 12/01/2017    Status  Achieved      PT SHORT TERM GOAL #3   Title  Patient standing balance without UE support stands 2 minutes and reaches 2" anteriorly with supervision.     Baseline  MET 12/01/2017    Status  Achieved      PT SHORT TERM GOAL #4   Title  Patient ambulates 150' outdoors on paved surfaces with RW & prosthesis with supervision.     Baseline  12/03/17: pt met the distance, not the assistance level    Status  Partially Met      PT SHORT TERM GOAL #5   Title  Patient negotiates stairs with 1 rail/cane, ramp & curb with RW & prosthesis with supervision.     Baseline  MET 12/01/2017    Time  4    Period  Weeks    Status  Achieved  PT Long Term Goals - 10/22/17 1447      PT LONG TERM GOAL #1   Title  Patient verbalizes & demonstrates proper prosthetic care to enable safe use of prosthesis. (All LTGs Target Date: 01/01/2018)     Time  12    Period  Weeks    Status  On-going    Target Date  01/01/18      PT LONG TERM GOAL #2   Title  Patient tolerates prosthesis wear >80% of awake hours without skin issues or limb pain to enable function throughout her day.     Time  12    Period  Weeks    Status  On-going    Target Date  01/01/18      PT LONG TERM GOAL #3   Title  Patient ambulates 500' outdoors including grass with LRAD & prosthesis modified independent for community mobility.     Time  12    Period  Weeks    Status  On-going    Target Date  01/01/18      PT LONG TERM GOAL #4   Title  Patient negotiates ramps, curbs & stairs with LRAD & prosthesis modified independent to enable community mobility.     Time  12    Period  Weeks    Status  On-going    Target Date  01/01/18      PT LONG TERM GOAL #5   Title  Berg Balance Test >/= 30/56    Time  12    Period  Weeks    Status  On-going    Target Date  01/01/18            Plan - 12/24/17 1554    Clinical Impression Statement  Patient is progressing with prosthetic gait & balance with BUE support of rollator walker or forearm crutches. Her LLE is beginning to be primary limiting factor to distance tolerated. Patient wil need recertification next week to improve safety & function further. Patient's son witnessed patient ambulating outdoors on pavement with supervision only with rollator walker & was surprised how mobile patient is now.     Clinical Impairments Affecting Rehab Potential  transfemoral level amputation with multiple medical issues, significantly limited activity level for >2 years    PT Frequency  2x / week    PT Duration  12 weeks    PT Treatment/Interventions  ADLs/Self Care Home Management;Moist Heat;DME Instruction;Gait training;Stair training;Functional mobility training;Therapeutic activities;Therapeutic exercise;Balance training;Neuromuscular re-education;Patient/family education;Prosthetic Training;Manual techniques;Vestibular     PT Next Visit Plan  assess LTGs and do recertification.     Consulted and Agree with Plan of Care  Patient       Patient will benefit from skilled therapeutic intervention in order to improve the following deficits and impairments:  Abnormal gait, Decreased activity tolerance, Decreased balance, Decreased endurance, Decreased knowledge of use of DME, Decreased mobility, Decreased strength, Decreased range of motion, Impaired flexibility, Prosthetic Dependency, Postural dysfunction  Visit Diagnosis: Other abnormalities of gait and mobility  Muscle weakness (generalized)  Unsteadiness on feet     Problem List Patient Active Problem List   Diagnosis Date Noted  . Benign essential HTN   . Stage 5 chronic kidney disease not on chronic dialysis ([place])   . Above knee amputation status, left ([place])   . DM type 2 causing CKD stage 5 ([place]) 03/21/2017  . Anemia of chronic disease 02/05/2017  . Dysplasia of cervix, high grade CIN 2 01/12/2017  . Chronic diastolic  CHF (congestive heart failure) ([place]) 11/08/2015  . Total knee replacement status   . Diabetic peripheral neuropathy ([place])   . Major depression, recurrent, chronic ([place])   . Chronic kidney disease (CKD), stage IV (severe) ([place])   . Hepatitis C 08/16/2014  . Peripheral neuropathy 06/24/2013  . Compulsive tobacco user syndrome 06/24/2013    Jamey Reas  PT, DPT 12/24/2017, 3:58 PM  [facility] [address] [place], [place], [address] Phone: [phone]   Fax:  [phone]  Name: DAILEE MANALANG MRN: [id number] Date of Birth: 05-Dec-1955

## 2017-12-28 ENCOUNTER — Other Ambulatory Visit (INDEPENDENT_AMBULATORY_CARE_PROVIDER_SITE_OTHER): Payer: Self-pay | Admitting: Orthopedic Surgery

## 2017-12-29 ENCOUNTER — Encounter: Payer: Self-pay | Admitting: Physical Therapy

## 2017-12-29 ENCOUNTER — Ambulatory Visit: Payer: Medicare Other | Admitting: Physical Therapy

## 2017-12-29 DIAGNOSIS — R262 Difficulty in walking, not elsewhere classified: Secondary | ICD-10-CM | POA: Diagnosis not present

## 2017-12-29 DIAGNOSIS — M6281 Muscle weakness (generalized): Secondary | ICD-10-CM

## 2017-12-29 DIAGNOSIS — M6249 Contracture of muscle, multiple sites: Secondary | ICD-10-CM

## 2017-12-29 DIAGNOSIS — R2689 Other abnormalities of gait and mobility: Secondary | ICD-10-CM

## 2017-12-29 DIAGNOSIS — R2681 Unsteadiness on feet: Secondary | ICD-10-CM

## 2017-12-29 DIAGNOSIS — R293 Abnormal posture: Secondary | ICD-10-CM | POA: Diagnosis not present

## 2017-12-29 NOTE — Progress Notes (Signed)
Postoperative Access Visit   History of Present Illness   Brandy Houston is a 62 y.o. year old female who presents for postoperative follow-up for: right first stage brachial vein transposition (Date: 10/26/17).  [name] wounds are healed.  The patient notes no steal symptoms.  The patient is able to complete their activities of daily living.  [name] current symptoms are: none.  Past Medical History:  Diagnosis Date  . Anemia   . Anxiety   . Arthritis    "in my joints" (03/10/2017)  . Chronic diastolic CHF (congestive heart failure) ([place])   . CKD (chronic kidney disease), stage IV ([place])    stage IV. previous HD, none currently 10/30/15 (confirmed 02/05/2017 & 03/10/2017)  . Depression    Chronic  . Diabetic peripheral neuropathy ([place])   . Dysrhythmia    tachycardia, normal ECHO 08-09-14  . GERD (gastroesophageal reflux disease)   . Heart murmur     dx'd 02/05/2017  . Hepatitis C    "tx'd in 2016; I'm negative now" (02/05/2017)  . History of blood transfusion 2017   "w/knee replacement"  . Hypertension   . Osteoarthritis of left knee   . Peripheral neuropathy   . Protein calorie malnutrition ([place])   . Septic arthritis ([place])   . Slow transit constipation   . Type II diabetes mellitus ([name])    IDDM  . Uncontrolled hypertension 02/18/2013  . Unsteady gait     Past Surgical History:  Procedure Laterality Date  . AMPUTATION Left 03/20/2017   Procedure: LEFT ABOVE KNEE AMPUTATION;  Surgeon: Newt Minion, MD;  Location: [facility];  Service: Orthopedics;  Laterality: Left;  . APPENDECTOMY    . AV FISTULA PLACEMENT Left 09/13/2014   Procedure: Brachial Artery to Brachial Vein Gortex Four - Seven Stretch GRAFT INSERTION Left Forearm;  Surgeon: Mal Misty, MD;  Location: [facility];  Service: Vascular;  Laterality: Left;  . BASCILIC VEIN TRANSPOSITION Right 10/26/2017   Procedure: BASILIC VEIN TRANSPOSITION FIRST STAGE RIGHT ARM;  Surgeon: Conrad Avant, MD;  Location: [facility];   Service: Vascular;  Laterality: Right;  . CHOLECYSTECTOMY OPEN    . COLON SURGERY    . EXCISIONAL TOTAL KNEE ARTHROPLASTY WITH ANTIBIOTIC SPACERS Left 02/06/2017   Procedure: Incisional total iknee with antibiotic spacer ;  Surgeon: Leandrew Koyanagi, MD;  Location: [facility];  Service: Orthopedics;  Laterality: Left;  . IR FLUORO GUIDE CV LINE RIGHT  02/10/2017  . IR REMOVAL TUN CV CATH W/O FL  04/01/2017  . IR US GUIDE VASC ACCESS RIGHT  02/10/2017  . IRRIGATION AND DEBRIDEMENT KNEE Left 03/12/2017   Procedure: IRRIGATION AND DEBRIDEMENT LEFT KNEE WITH WOUND VAC APPLICATION;  Surgeon: Leandrew Koyanagi, MD;  Location: [facility];  Service: Orthopedics;  Laterality: Left;  . JOINT REPLACEMENT    . KNEE ARTHROSCOPY Right 08/10/2014   Procedure: ARTHROSCOPY I & D KNEE;  Surgeon: Marianna Payment, MD;  Location: [facility];  Service: Orthopedics;  Laterality: Right;  . KNEE ARTHROSCOPY Left 08/11/2014   Procedure: ARTHROSCOPIC WASHOUT LEFT KNEE;  Surgeon: Marianna Payment, MD;  Location: [facility];  Service: Orthopedics;  Laterality: Left;  . KNEE ARTHROSCOPY Left 08/19/2014   Procedure: ARTHROSCOPIC WASHOUT LEFT KNEE;  Surgeon: Leandrew Koyanagi, MD;  Location: [facility];  Service: Orthopedics;  Laterality: Left;  . KNEE ARTHROSCOPY WITH LATERAL MENISECTOMY Left 04/04/2015   Procedure: AND PARTIAL LATERAL MENISECTOMY;  Surgeon: Leandrew Koyanagi, MD;  Location:  [place] [facility];  Service: Orthopedics;  Laterality: Left;  . KNEE ARTHROSCOPY WITH MEDIAL MENISECTOMY Left 04/04/2015   Procedure: LEFT KNEE ARTHROSCOPY WITH PARTIAL MEDIAL MENISCECTOMY  AND SYNOVECTOMY;  Surgeon: Leandrew Koyanagi, MD;  Location: [facility];  Service: Orthopedics;  Laterality: Left;  . SHOULDER ARTHROSCOPY Bilateral 08/10/2014   Procedure: I & D BILATERAL SHOULDERS ;  Surgeon: Marianna Payment, MD;  Location: [facility];  Service: Orthopedics;  Laterality: Bilateral;  . SMALL INTESTINE SURGERY     Due to Small Bowel Obstruction; "fixed it when  they did my gallbladder OR"  . TEE WITHOUT CARDIOVERSION N/A 08/14/2014   Procedure: TRANSESOPHAGEAL ECHOCARDIOGRAM (TEE);  Surgeon: Thayer Headings, MD;  Location: [facility];  Service: Cardiovascular;  Laterality: N/A;  . TENOSYNOVECTOMY Right 08/11/2014   Procedure: RIGHT WRIST IRRIGATION AND DEBRIDEMENT, TENOSYNOVECTOMY;  Surgeon: Marianna Payment, MD;  Location: [facility];  Service: Orthopedics;  Laterality: Right;  . TOTAL KNEE ARTHROPLASTY Left 11/07/2015   Procedure: LEFT TOTAL KNEE ARTHROPLASTY WITH REVISION OF IMPLANTS;  Surgeon: Leandrew Koyanagi, MD;  Location: [facility];  Service: Orthopedics;  Laterality: Left;  . TUBAL LIGATION      Social History   Socioeconomic History  . Marital status: Single    Spouse name: Not on file  . Number of children: Not on file  . Years of education: Not on file  . Highest education level: Not on file  Occupational History  . Not on file  Social Needs  . Financial resource strain: Not on file  . Food insecurity:    Worry: Not on file    Inability: Not on file  . Transportation needs:    Medical: Not on file    Non-medical: Not on file  Tobacco Use  . Smoking status: Current Every Day Smoker    Packs/day: 0.50    Years: 35.00    Pack years: 17.50    Types: Cigarettes  . Smokeless tobacco: Never Used  Substance and Sexual Activity  . Alcohol use: No    Alcohol/week: 0.0 oz    Comment: 03/10/2017 "I drink a wine cooler a few times/year"  . Drug use: No  . Sexual activity: Never  Lifestyle  . Physical activity:    Days per week: Not on file    Minutes per session: Not on file  . Stress: Not on file  Relationships  . Social connections:    Talks on phone: Not on file    Gets together: Not on file    Attends religious service: Not on file    Active member of club or organization: Not on file    Attends meetings of clubs or organizations: Not on file    Relationship status: Not on file  . Intimate partner violence:    Fear of current or  ex partner: Not on file    Emotionally abused: Not on file    Physically abused: Not on file    Forced sexual activity: Not on file  Other Topics Concern  . Not on file  Social History Narrative  . Not on file    Family History  Problem Relation Age of Onset  . Cancer Mother   . Heart disease Father   . Cancer Sister   . Hypertension Sister   . Cancer Brother   . Diabetes Brother   . Hypertension Brother     Current Outpatient Medications  Medication Sig Dispense Refill  . amLODipine (NORVASC) 10 MG  tablet Take 1 tablet (10 mg total) by mouth at bedtime. 30 tablet 0  . aspirin EC 81 MG tablet Take 81 mg by mouth daily.    [name] atorvastatin (LIPITOR) 10 MG tablet Take 1 tablet (10 mg total) by mouth at bedtime. 90 tablet 3  . calcitRIOL (ROCALTROL) 0.25 MCG capsule Take 1 capsule (0.25 mcg total) by mouth every Monday, Wednesday, and Friday. 90 capsule 0  . calcium carbonate (TUMS EX) 750 MG chewable tablet Chew 1 tablet by mouth 3 (three) times daily as needed for heartburn.     . cloNIDine (CATAPRES) 0.2 MG tablet Take 0.2 mg by mouth 2 (two) times daily.    . ferrous sulfate 325 (65 FE) MG tablet Take 1 tablet (325 mg total) by mouth 2 (two) times daily with a meal. 60 tablet 3  . gabapentin (NEURONTIN) 300 MG capsule TAKE 1 CAPSULE BY MOUTH THREE TIMES DAILY 90 capsule 0  . hydrALAZINE (APRESOLINE) 25 MG tablet Take 4 tablets (100 mg total) by mouth 2 (two) times daily.    . Insulin Glargine (LANTUS SOLOSTAR) 100 UNIT/ML Solostar Pen Inject 12 Units into the skin daily at 10 pm. 5 pen PRN  . methocarbamol (ROBAXIN) 500 MG tablet TAKE 1 TABLET BY MOUTH EVERY 6 HOURS AS NEEDED FOR MUSCLE SPASM 30 tablet 2  . metolazone (ZAROXOLYN) 2.5 MG tablet Take 2.5 mg by mouth daily.  5  . metoprolol succinate (TOPROL-XL) 50 MG 24 hr tablet Take 1 tablet (50 mg total) by mouth 2 (two) times daily. 60 tablet 0  . Multiple Vitamin (MULTIVITAMIN WITH MINERALS) TABS tablet Take 1 tablet by mouth  daily.    [name] omeprazole (PRILOSEC) 20 MG capsule Take 20 mg by mouth daily before breakfast.     . polyethylene glycol (MIRALAX / GLYCOLAX) packet Take 17 g by mouth 2 (two) times daily. 14 each 0  . sertraline (ZOLOFT) 100 MG tablet Take 1 tablet (100 mg total) by mouth daily. 30 tablet 6  . sodium bicarbonate 650 MG tablet Take 1,300 mg by mouth 3 (three) times daily.   3  . Blood Glucose Monitoring Suppl (ACCU-CHEK AVIVA PLUS) W/DEVICE KIT Check sugars TID for E11.65 (Patient not taking: Reported on 10/20/2017) 1 kit 0  . furosemide (LASIX) 80 MG tablet Take 1 tablet (80 mg total) by mouth 2 (two) times daily. 60 tablet 0  . glucose blood (ACCU-CHEK AVIVA) test strip Check sugars TID for E11.65 (Patient not taking: Reported on 10/20/2017) 100 each 12  . Lancet Devices (ACCU-CHEK SOFTCLIX) lancets Check sugars TID for E11.65 (Patient not taking: Reported on 10/20/2017) 1 each 10  . oxyCODONE (ROXICODONE) 5 MG immediate release tablet Take 1 tablet (5 mg total) by mouth every 4 (four) hours as needed for severe pain. (Patient not taking: Reported on 11/25/2017) 10 tablet 0   No current facility-administered medications for this visit.      Allergies  Allergen Reactions  . Compazine [Prochlorperazine] Shortness Of Breath, Swelling and Other (See Comments)    TONGUE SWELLS  . Shellfish-Derived Products Anaphylaxis  . Iodinated Diagnostic Agents Hives and Rash  . Omnipaque [Iohexol] Hives  . Sulfa Antibiotics Rash    REVIEW OF SYSTEMS (negative unless checked):   Cardiac:  [] Chest pain or chest pressure? [] Shortness of breath upon activity? [] Shortness of breath when lying flat? [] Irregular heart rhythm?  Vascular:  [] Pain in calf, thigh, or hip brought on by walking? [] Pain in feet at  night that wakes you up from your sleep? [] Blood clot in your veins? [] Leg swelling?  Pulmonary:  [] Oxygen at home? [] Productive cough? [] Wheezing?  Neurologic:  [] Sudden weakness in arms  or legs? [] Sudden numbness in arms or legs? [] Sudden onset of difficult speaking or slurred speech? [] Temporary loss of vision in one eye? [] Problems with dizziness?  Gastrointestinal:  [] Blood in stool? [] Vomited blood?  Genitourinary:  [] Burning when urinating? [] Blood in urine? [x]  CKD Stage V  Psychiatric:  [] Major depression  Hematologic:  [] Bleeding problems? [] Problems with blood clotting?  Dermatologic:  [] Rashes or ulcers?  Constitutional:  [] Fever or chills?  Ear/Nose/Throat:  [] Change in hearing? [] Nose bleeds? [] Sore throat?  Musculoskeletal:  [] Back pain? [] Joint pain? [] Muscle pain?   Physical Examination   Vitals:   01/01/18 0914 01/01/18 0916  BP: (!) 169/76 (!) 171/76  Pulse: 66   Resp: 18   SpO2: 94%   Weight: 147 lb (66.7 kg)   Height: 5' 7" (1.702 m)      right arm Incision is healed, skin feels warm, hand grip is 5/5, sensation in digits is intact, palpable thrill, bruit can be auscultated, On Sonosite: 5.5-6.5 mm     Medical Decision Making   Brandy Houston is a 62 y.o. year old female who presents s/p right first stage brachial vein transposition   [name] access is ready for use.  Patient is going on vacation, so earliest the R 2nd stage BVT can be done is 19 AUG 19. Risk, benefits, and alternatives to access surgery were discussed.   The patient is aware the risks include but are not limited to: bleeding, infection, steal syndrome, nerve damage, ischemic monomelic neuropathy, thrombosis, failure to mature, complications related to venous hypertension, need for additional procedures, death and stroke.   The patient agrees to proceed forward with the procedure on 19 AUG 19.  I have discussed with the patient that one of my partners will have to complete the procedure as I will have transitioned to another practice by then.  Thank you for allowing [place] to participate in this patient's care.   Adele Barthel, MD, FACS [facility] Office: [phone] Pager: [phone]

## 2017-12-29 NOTE — Therapy (Signed)
[facility] [address] [place], [place], [address] Phone: [phone]   Fax:  [phone]  Physical Therapy Treatment  Patient Details  Name: Brandy Houston MRN: [id number] Date of Birth: 1956-04-30 Referring Provider: Meridee Score, MD   Encounter Date: 12/29/2017  PT End of Session - 12/29/17 1257    Visit Number  21    Number of Visits  25    Date for PT Re-Evaluation  01/05/18    Authorization Type  Medicare & Medicaid    PT Start Time  0321    PT Stop Time  1100    PT Time Calculation (min)  45 min    Equipment Utilized During Treatment  Gait belt    Activity Tolerance  Patient tolerated treatment well;Patient limited by fatigue    Behavior During Therapy  [facility] for tasks assessed/performed       Past Medical History:  Diagnosis Date  . Anemia   . Anxiety   . Arthritis    "in my joints" (03/10/2017)  . Chronic diastolic CHF (congestive heart failure) ([place])   . CKD (chronic kidney disease), stage IV ([place])    stage IV. previous HD, none currently 10/30/15 (confirmed 02/05/2017 & 03/10/2017)  . Depression    Chronic  . Diabetic peripheral neuropathy ([place])   . Dysrhythmia    tachycardia, normal ECHO 08-09-14  . GERD (gastroesophageal reflux disease)   . Heart murmur     dx'd 02/05/2017  . Hepatitis C    "tx'd in 2016; I'm negative now" (02/05/2017)  . History of blood transfusion 2017   "w/knee replacement"  . Hypertension   . Osteoarthritis of left knee   . Peripheral neuropathy   . Protein calorie malnutrition ([place])   . Septic arthritis ([place])   . Slow transit constipation   . Type II diabetes mellitus ([name])    IDDM  . Uncontrolled hypertension 02/18/2013  . Unsteady gait     Past Surgical History:  Procedure Laterality Date  . AMPUTATION Left 03/20/2017   Procedure: LEFT ABOVE KNEE AMPUTATION;  Surgeon: Newt Minion, MD;  Location: [facility];  Service: Orthopedics;  Laterality: Left;  . APPENDECTOMY     . AV FISTULA PLACEMENT Left 09/13/2014   Procedure: Brachial Artery to Brachial Vein Gortex Four - Seven Stretch GRAFT INSERTION Left Forearm;  Surgeon: Mal Misty, MD;  Location: [facility];  Service: Vascular;  Laterality: Left;  . BASCILIC VEIN TRANSPOSITION Right 10/26/2017   Procedure: BASILIC VEIN TRANSPOSITION FIRST STAGE RIGHT ARM;  Surgeon: Conrad Georgetown, MD;  Location: [facility];  Service: Vascular;  Laterality: Right;  . CHOLECYSTECTOMY OPEN    . COLON SURGERY    . EXCISIONAL TOTAL KNEE ARTHROPLASTY WITH ANTIBIOTIC SPACERS Left 02/06/2017   Procedure: Incisional total iknee with antibiotic spacer ;  Surgeon: Leandrew Koyanagi, MD;  Location: [facility];  Service: Orthopedics;  Laterality: Left;  . IR FLUORO GUIDE CV LINE RIGHT  02/10/2017  . IR REMOVAL TUN CV CATH W/O FL  04/01/2017  . IR US GUIDE VASC ACCESS RIGHT  02/10/2017  . IRRIGATION AND DEBRIDEMENT KNEE Left 03/12/2017   Procedure: IRRIGATION AND DEBRIDEMENT LEFT KNEE WITH WOUND VAC APPLICATION;  Surgeon: Leandrew Koyanagi, MD;  Location: [facility];  Service: Orthopedics;  Laterality: Left;  . JOINT REPLACEMENT    . KNEE ARTHROSCOPY Right 08/10/2014   Procedure: ARTHROSCOPY I & D KNEE;  Surgeon: Marianna Payment, MD;  Location: [facility];  Service:  Orthopedics;  Laterality: Right;  . KNEE ARTHROSCOPY Left 08/11/2014   Procedure: ARTHROSCOPIC WASHOUT LEFT KNEE;  Surgeon: Marianna Payment, MD;  Location: [facility];  Service: Orthopedics;  Laterality: Left;  . KNEE ARTHROSCOPY Left 08/19/2014   Procedure: ARTHROSCOPIC WASHOUT LEFT KNEE;  Surgeon: Leandrew Koyanagi, MD;  Location: [facility];  Service: Orthopedics;  Laterality: Left;  . KNEE ARTHROSCOPY WITH LATERAL MENISECTOMY Left 04/04/2015   Procedure: AND PARTIAL LATERAL MENISECTOMY;  Surgeon: Leandrew Koyanagi, MD;  Location: [facility];  Service: Orthopedics;  Laterality: Left;  . KNEE ARTHROSCOPY WITH MEDIAL MENISECTOMY Left 04/04/2015   Procedure: LEFT KNEE ARTHROSCOPY WITH PARTIAL MEDIAL MENISCECTOMY   AND SYNOVECTOMY;  Surgeon: Leandrew Koyanagi, MD;  Location: [facility];  Service: Orthopedics;  Laterality: Left;  . SHOULDER ARTHROSCOPY Bilateral 08/10/2014   Procedure: I & D BILATERAL SHOULDERS ;  Surgeon: Marianna Payment, MD;  Location: [facility];  Service: Orthopedics;  Laterality: Bilateral;  . SMALL INTESTINE SURGERY     Due to Small Bowel Obstruction; "fixed it when they did my gallbladder OR"  . TEE WITHOUT CARDIOVERSION N/A 08/14/2014   Procedure: TRANSESOPHAGEAL ECHOCARDIOGRAM (TEE);  Surgeon: Thayer Headings, MD;  Location: [facility];  Service: Cardiovascular;  Laterality: N/A;  . TENOSYNOVECTOMY Right 08/11/2014   Procedure: RIGHT WRIST IRRIGATION AND DEBRIDEMENT, TENOSYNOVECTOMY;  Surgeon: Marianna Payment, MD;  Location: [facility];  Service: Orthopedics;  Laterality: Right;  . TOTAL KNEE ARTHROPLASTY Left 11/07/2015   Procedure: LEFT TOTAL KNEE ARTHROPLASTY WITH REVISION OF IMPLANTS;  Surgeon: Leandrew Koyanagi, MD;  Location: [facility];  Service: Orthopedics;  Laterality: Left;  . TUBAL LIGATION      There were no vitals filed for this visit.  Subjective Assessment - 12/29/17 1020    Subjective  Her son fixed rollator so she has been using it in house. She is wearring prosthesis 3-6 hrs ~2-3 days/wk.     Pertinent History  left TFA, DM, neuropathy, Hep C, HTN, CHF, depression, CKD st 5 not dialysis, arthritis, heart murmur,     Limitations  Standing;Walking;House hold activities    Patient Stated Goals  To walk with prosthesis in home & community, travel plane, car & cruise in September    Currently in Pain?  No/denies                       [facility] Adult PT Treatment/Exercise - 12/29/17 1015      Transfers   Transfers  Sit to Stand;Stand to [employer] Transfers    Sit to Stand  5: Supervision;From chair/3-in-1;With upper extremity assist;With armrests to rollator walker & to forearm crutches    Sit to Stand Details  Verbal cues for safe use of DME/AE     Stand to Sit  5: Supervision;With upper extremity assist;With armrests;To chair/3-in-1 from rollator walker & from forearm crutches    Stand to Sit Details (indicate cue type and reason)  Verbal cues for precautions/safety;Verbal cues for safe use of DME/AE    Stand Pivot Transfers  5: Supervision turning 180* to sit/stand rollator seat.      Ambulation/Gait   Ambulation/Gait  Yes    Ambulation/Gait Assistance  5: Supervision;4: Min guard supervision w/ rollator walker & minA-guard with crutches    Ambulation/Gait Assistance Details  non-amputated limb vascular pain limited distance to 250' with rollator walker    Ambulation Distance (Feet)  250 Feet 250' with rollator walker & 100' with forearm  crutches    Assistive device  Rollator;Lofstrands;Prosthesis    Gait Pattern  Step-through pattern;Decreased step length - right;Decreased step length - left;Decreased weight shift to left;Narrow base of support    Ambulation Surface  Indoor;Level    Stairs  Yes    Stairs Assistance  5: Supervision    Stair Management Technique  One rail Left;With crutches;Step to pattern;Forwards    Number of Stairs  4 2 reps    Ramp  4: Min assist rollator walker & prosthesis    Ramp Details (indicate cue type and reason)  verbal cues on technique with rollator & TFA prosthesis    Curb  4: Min assist rollator walker & TFA prosthesis    Curb Details (indicate cue type and reason)  verbal cues on technique with rollator & TFA prosthesis      Self-Care   Lifting  PT demo, instructed then pt return demo understanding with min guard: lifting half full laundry basket to/from mat table to rollator walker seat and transporting 30'.      Prosthetics   Prosthetic Care Comments   patient saw prosthetist who leveled height and dynamic alignment check.  PT again reviewed need for daily & increased prosthesis wear for function, decreasing energy expenditure & fall risk.     Current prosthetic wear tolerance (days/week)    reports only 2 of last 7 days    Current prosthetic wear tolerance (#hours/day)   reports 2-6 hours/day 1x/day    Residual limb condition   intact    Education Provided  Proper Donning;Proper wear schedule/adjustment;Other (comment) see prosthetic care comments    Person(s) Educated  Patient    Education Method  Explanation;Verbal cues    Education Method  Verbalized understanding;Verbal cues required;Needs further instruction    Donning Prosthesis  Supervision    Doffing Prosthesis  Supervision               PT Short Term Goals - 12/04/17 2231      PT SHORT TERM GOAL #1   Title  Patient verbalizes initial understanding of need to adjust ply socks with limb volume changes. (All STGs Target Date: 12/04/2017)    Baseline  MET 12/01/2017    Status  Achieved      PT SHORT TERM GOAL #2   Title  Patient tolerates prosthesis wear >10hrs total /day without skin issues.     Baseline  MET 12/01/2017    Status  Achieved      PT SHORT TERM GOAL #3   Title  Patient standing balance without UE support stands 2 minutes and reaches 2" anteriorly with supervision.     Baseline  MET 12/01/2017    Status  Achieved      PT SHORT TERM GOAL #4   Title  Patient ambulates 150' outdoors on paved surfaces with RW & prosthesis with supervision.     Baseline  12/03/17: pt met the distance, not the assistance level    Status  Partially Met      PT SHORT TERM GOAL #5   Title  Patient negotiates stairs with 1 rail/cane, ramp & curb with RW & prosthesis with supervision.     Baseline  MET 12/01/2017    Time  4    Period  Weeks    Status  Achieved        PT Long Term Goals - 10/22/17 1447      PT LONG TERM GOAL #1   Title  Patient verbalizes & demonstrates  proper prosthetic care to enable safe use of prosthesis. (All LTGs Target Date: 01/01/2018)    Time  12    Period  Weeks    Status  On-going    Target Date  01/01/18      PT LONG TERM GOAL #2   Title  Patient tolerates prosthesis wear >80% of  awake hours without skin issues or limb pain to enable function throughout her day.     Time  12    Period  Weeks    Status  On-going    Target Date  01/01/18      PT LONG TERM GOAL #3   Title  Patient ambulates 500' outdoors including grass with LRAD & prosthesis modified independent for community mobility.     Time  12    Period  Weeks    Status  On-going    Target Date  01/01/18      PT LONG TERM GOAL #4   Title  Patient negotiates ramps, curbs & stairs with LRAD & prosthesis modified independent to enable community mobility.     Time  12    Period  Weeks    Status  On-going    Target Date  01/01/18      PT LONG TERM GOAL #5   Title  Berg Balance Test >/= 30/56    Time  12    Period  Weeks    Status  On-going    Target Date  01/01/18            Plan - 12/29/17 2303    Clinical Impression Statement  Patient is making progress with prosthetic mobility. However she needs additional skilled care to fully meet her potential and improve safety. PT does not anticipate meeting LTGs by next session which is end of initial 90 days plan of care.     Clinical Impairments Affecting Rehab Potential  transfemoral level amputation with multiple medical issues, significantly limited activity level for >2 years    PT Frequency  2x / week    PT Duration  12 weeks    PT Treatment/Interventions  ADLs/Self Care Home Management;Moist Heat;DME Instruction;Gait training;Stair training;Functional mobility training;Therapeutic activities;Therapeutic exercise;Balance training;Neuromuscular re-education;Patient/family education;Prosthetic Training;Manual techniques;Vestibular    PT Next Visit Plan  assess LTGs and do recertification.     Consulted and Agree with Plan of Care  Patient       Patient will benefit from skilled therapeutic intervention in order to improve the following deficits and impairments:  Abnormal gait, Decreased activity tolerance, Decreased balance, Decreased endurance,  Decreased knowledge of use of DME, Decreased mobility, Decreased strength, Decreased range of motion, Impaired flexibility, Prosthetic Dependency, Postural dysfunction  Visit Diagnosis: Other abnormalities of gait and mobility  Muscle weakness (generalized)  Unsteadiness on feet  Contracture of muscle, multiple sites     Problem List Patient Active Problem List   Diagnosis Date Noted  . Benign essential HTN   . Stage 5 chronic kidney disease not on chronic dialysis ([place])   . Above knee amputation status, left ([place])   . DM type 2 causing CKD stage 5 ([place]) 03/21/2017  . Anemia of chronic disease 02/05/2017  . Dysplasia of cervix, high grade CIN 2 01/12/2017  . Chronic diastolic CHF (congestive heart failure) ([place]) 11/08/2015  . Total knee replacement status   . Diabetic peripheral neuropathy ([place])   . Major depression, recurrent, chronic ([place])   . Chronic kidney disease (CKD), stage IV (severe) ([place])   . Hepatitis  C 08/16/2014  . Peripheral neuropathy 06/24/2013  . Compulsive tobacco user syndrome 06/24/2013    Vanderbilt Ranieri PT, DPT 12/29/2017, 11:05 PM  [facility] [address] [place], [place], [address] Phone: [phone]   Fax:  [phone]  Name: Brandy Houston MRN: [id number] Date of Birth: [birth year]

## 2017-12-31 ENCOUNTER — Encounter: Payer: Self-pay | Admitting: Physical Therapy

## 2017-12-31 ENCOUNTER — Ambulatory Visit: Payer: Medicare Other | Admitting: Physical Therapy

## 2017-12-31 DIAGNOSIS — R262 Difficulty in walking, not elsewhere classified: Secondary | ICD-10-CM | POA: Diagnosis not present

## 2017-12-31 DIAGNOSIS — M6249 Contracture of muscle, multiple sites: Secondary | ICD-10-CM

## 2017-12-31 DIAGNOSIS — M6281 Muscle weakness (generalized): Secondary | ICD-10-CM

## 2017-12-31 DIAGNOSIS — R293 Abnormal posture: Secondary | ICD-10-CM

## 2017-12-31 DIAGNOSIS — R2689 Other abnormalities of gait and mobility: Secondary | ICD-10-CM

## 2017-12-31 DIAGNOSIS — R2681 Unsteadiness on feet: Secondary | ICD-10-CM | POA: Diagnosis not present

## 2017-12-31 DIAGNOSIS — M79605 Pain in left leg: Secondary | ICD-10-CM

## 2017-12-31 NOTE — Therapy (Signed)
[facility] [address] [place], [place], [address] Phone: [phone]   Fax:  [phone]  Physical Therapy Treatment  Patient Details  Name: Brandy Houston MRN: [id number] Date of Birth: March 25, 1956 Referring Provider: Meridee Score, MD   Encounter Date: 12/31/2017  PT End of Session - 12/31/17 1159    Visit Number  22    Number of Visits  42    Date for PT Re-Evaluation  04/02/18    Authorization Type  Medicare & Medicaid    PT Start Time  3244    PT Stop Time  1101    PT Time Calculation (min)  38 min    Equipment Utilized During Treatment  Gait belt    Activity Tolerance  Patient tolerated treatment well;Patient limited by fatigue    Behavior During Therapy  WFL for tasks assessed/performed       Past Medical History:  Diagnosis Date  . Anemia   . Anxiety   . Arthritis    "in my joints" (03/10/2017)  . Chronic diastolic CHF (congestive heart failure) ([place])   . CKD (chronic kidney disease), stage IV ([place])    stage IV. previous HD, none currently 10/30/15 (confirmed 02/05/2017 & 03/10/2017)  . Depression    Chronic  . Diabetic peripheral neuropathy ([place])   . Dysrhythmia    tachycardia, normal ECHO 08-09-14  . GERD (gastroesophageal reflux disease)   . Heart murmur     dx'd 02/05/2017  . Hepatitis C    "tx'd in 2016; I'm negative now" (02/05/2017)  . History of blood transfusion 2017   "w/knee replacement"  . Hypertension   . Osteoarthritis of left knee   . Peripheral neuropathy   . Protein calorie malnutrition ([place])   . Septic arthritis ([place])   . Slow transit constipation   . Type II diabetes mellitus ([name])    IDDM  . Uncontrolled hypertension 02/18/2013  . Unsteady gait     Past Surgical History:  Procedure Laterality Date  . AMPUTATION Left 03/20/2017   Procedure: LEFT ABOVE KNEE AMPUTATION;  Surgeon: Newt Minion, MD;  Location: [facility];  Service: Orthopedics;  Laterality: Left;  . APPENDECTOMY     . AV FISTULA PLACEMENT Left 09/13/2014   Procedure: Brachial Artery to Brachial Vein Gortex Four - Seven Stretch GRAFT INSERTION Left Forearm;  Surgeon: Mal Misty, MD;  Location: [facility];  Service: Vascular;  Laterality: Left;  . BASCILIC VEIN TRANSPOSITION Right 10/26/2017   Procedure: BASILIC VEIN TRANSPOSITION FIRST STAGE RIGHT ARM;  Surgeon: Conrad Morrison, MD;  Location: [facility];  Service: Vascular;  Laterality: Right;  . CHOLECYSTECTOMY OPEN    . COLON SURGERY    . EXCISIONAL TOTAL KNEE ARTHROPLASTY WITH ANTIBIOTIC SPACERS Left 02/06/2017   Procedure: Incisional total iknee with antibiotic spacer ;  Surgeon: Leandrew Koyanagi, MD;  Location: [facility];  Service: Orthopedics;  Laterality: Left;  . IR FLUORO GUIDE CV LINE RIGHT  02/10/2017  . IR REMOVAL TUN CV CATH W/O FL  04/01/2017  . IR US GUIDE VASC ACCESS RIGHT  02/10/2017  . IRRIGATION AND DEBRIDEMENT KNEE Left 03/12/2017   Procedure: IRRIGATION AND DEBRIDEMENT LEFT KNEE WITH WOUND VAC APPLICATION;  Surgeon: Leandrew Koyanagi, MD;  Location: [facility];  Service: Orthopedics;  Laterality: Left;  . JOINT REPLACEMENT    . KNEE ARTHROSCOPY Right 08/10/2014   Procedure: ARTHROSCOPY I & D KNEE;  Surgeon: Marianna Payment, MD;  Location: [facility];  Service:  Orthopedics;  Laterality: Right;  . KNEE ARTHROSCOPY Left 08/11/2014   Procedure: ARTHROSCOPIC WASHOUT LEFT KNEE;  Surgeon: Marianna Payment, MD;  Location: [facility];  Service: Orthopedics;  Laterality: Left;  . KNEE ARTHROSCOPY Left 08/19/2014   Procedure: ARTHROSCOPIC WASHOUT LEFT KNEE;  Surgeon: Leandrew Koyanagi, MD;  Location: [facility];  Service: Orthopedics;  Laterality: Left;  . KNEE ARTHROSCOPY WITH LATERAL MENISECTOMY Left 04/04/2015   Procedure: AND PARTIAL LATERAL MENISECTOMY;  Surgeon: Leandrew Koyanagi, MD;  Location: [facility];  Service: Orthopedics;  Laterality: Left;  . KNEE ARTHROSCOPY WITH MEDIAL MENISECTOMY Left 04/04/2015   Procedure: LEFT KNEE ARTHROSCOPY WITH PARTIAL MEDIAL MENISCECTOMY   AND SYNOVECTOMY;  Surgeon: Leandrew Koyanagi, MD;  Location: [facility];  Service: Orthopedics;  Laterality: Left;  . SHOULDER ARTHROSCOPY Bilateral 08/10/2014   Procedure: I & D BILATERAL SHOULDERS ;  Surgeon: Marianna Payment, MD;  Location: [facility];  Service: Orthopedics;  Laterality: Bilateral;  . SMALL INTESTINE SURGERY     Due to Small Bowel Obstruction; "fixed it when they did my gallbladder OR"  . TEE WITHOUT CARDIOVERSION N/A 08/14/2014   Procedure: TRANSESOPHAGEAL ECHOCARDIOGRAM (TEE);  Surgeon: Thayer Headings, MD;  Location: [facility];  Service: Cardiovascular;  Laterality: N/A;  . TENOSYNOVECTOMY Right 08/11/2014   Procedure: RIGHT WRIST IRRIGATION AND DEBRIDEMENT, TENOSYNOVECTOMY;  Surgeon: Marianna Payment, MD;  Location: [facility];  Service: Orthopedics;  Laterality: Right;  . TOTAL KNEE ARTHROPLASTY Left 11/07/2015   Procedure: LEFT TOTAL KNEE ARTHROPLASTY WITH REVISION OF IMPLANTS;  Surgeon: Leandrew Koyanagi, MD;  Location: [facility];  Service: Orthopedics;  Laterality: Left;  . TUBAL LIGATION      There were no vitals filed for this visit.  Subjective Assessment - 12/31/17 1027    Subjective  She reports working on wearing prosthesis. No falls.     Pertinent History  left TFA, DM, neuropathy, Hep C, HTN, CHF, depression, CKD st 5 not dialysis, arthritis, heart murmur,     Limitations  Standing;Walking;House hold activities    Patient Stated Goals  To walk with prosthesis in home & community, travel plane, car & cruise in September    Currently in Pain?  No/denies         [facility] PT Assessment - 12/31/17 1015      Standardized Balance Assessment   Standardized Balance Assessment  Berg Balance Test      Berg Balance Test   Sit to Stand  Needs minimal aid to stand or to stabilize RW = 3    Standing Unsupported  Needs several tries to stand 30 seconds unsupported RW support = 2    Sitting with Back Unsupported but Feet Supported on Floor or Stool  Able to sit safely and  securely 2 minutes    Stand to Sit  Sits independently, has uncontrolled descent RW support = 3    Transfers  Able to transfer safely, definite need of hands    Standing Unsupported with Eyes Closed  Needs help to keep from falling RW support = 3    Standing Ubsupported with Feet Together  Needs help to attain position and unable to hold for 15 seconds RW support = 3    From Standing, Reach Forward with Outstretched Arm  Loses balance while trying/requires external support RW support = 3    From Standing Position, Pick up Object from Floor  Unable to try/needs assist to keep balance RW support = 3  From Standing Position, Turn to Look Behind Over each Shoulder  Needs assist to keep from losing balance and falling RW support = 3    Turn 360 Degrees  Needs assistance while turning RW support = 1    Standing Unsupported, Alternately Place Feet on Step/Stool  Needs assistance to keep from falling or unable to try RW support = 1    Standing Unsupported, One Foot in [employer] balance while stepping or standing RW support = 2    Standing on One Leg  Unable to try or needs assist to prevent fall RW support = 2    Total Score  10    Merrilee Jansky commentMerrilee Jansky Task with RW support 36/56      Prosthetics Assessment - 12/31/17 [place] with  Prosthetic cleaning    Prosthetic Care Dependent with  Skin check;Residual limb care;Correct ply sock adjustment;Proper wear schedule/adjustment    Residual limb condition   intact                  [facility] Adult PT Treatment/Exercise - 12/31/17 1015      Transfers   Transfers  Sit to Stand;Stand to [employer] Transfers    Sit to Stand  5: Supervision;With upper extremity assist;With armrests;From chair/3-in-1 to rollator walker    Stand to Sit  5: Supervision;With upper extremity assist;With armrests;To chair/3-in-1 from rollator walker    Stand Pivot Transfers  5: Supervision;With armrests turning 180*  to/from rollator seat      Ambulation/Gait   Ambulation/Gait  Yes    Ambulation/Gait Assistance  5: Supervision    Ambulation/Gait Assistance Details  worked on negotiating around furniture, verbal cues on Nurse, adult (Feet)  200 Feet 200' X 3    Assistive device  Rollator;Prosthesis    Ambulation Surface  Indoor;Level    Ramp  4: Min assist rollator walker & TFA prosthesis    Ramp Details (indicate cue type and reason)  verbal cues on technique with rollator    Curb  4: Min assist rollator walker & TFA prosthesis    Curb Details (indicate cue type and reason)  verbal cues on technique with rollator               PT Short Term Goals - 12/31/17 1509      PT SHORT TERM GOAL #1   Title  Patient verbalizes initial understanding of need to adjust ply socks with limb volume changes. (All STGs Target Date: 02/05/2018)    Time  1    Period  Months    Status  New    Target Date  02/05/18      PT SHORT TERM GOAL #2   Title  patient reports prosthesis wear >5 days /wk for >/= 8 hrs/day    Time  1    Period  Months    Status  New    Target Date  02/05/18      PT SHORT TERM GOAL #3   Title  Patient picks up 3# objects from floor with RW support with prosthesis with supervision.     Time  1    Period  Months    Status  New    Target Date  02/05/18      PT SHORT TERM GOAL #4   Title  Patient ambulates 300' with rollator walker & prosthesis with supervision.     Time  1    Period  Months    Status  New    Target Date  02/05/18      PT SHORT TERM GOAL #5   Title  Patient negotiates stairs with 1 rail/crutch, ramp & curb with Rollator walker & prosthesis with supervision.     Time  1    Period  Months    Status  New    Target Date  02/05/18        PT Long Term Goals - 12/31/17 1445      PT LONG TERM GOAL #1   Title  Patient verbalizes & demonstrates proper prosthetic care to enable safe use of prosthesis. (All LTGs Target Date: 01/01/2018)     Baseline  NOT MET 12/30/2017 Patient requires cues for adjusting ply socks, sweat management, when to see prothetist & adjusting wear schedule. She verbalizes proper cleaning & donning with occassional reminders to fully tighten suspension strap.     Time  12    Period  Weeks    Status  Not Met      PT LONG TERM GOAL #2   Title  Patient tolerates prosthesis wear >80% of awake hours without skin issues or limb pain to enable function throughout her day.     Baseline  NOT MET 12/30/2017 Patient reports wear 2-3 days/wk for 3-6 hours.     Time  12    Period  Weeks    Status  Not Met      PT LONG TERM GOAL #3   Title  Patient ambulates 500' outdoors including grass with LRAD & prosthesis modified independent for community mobility.     Baseline  NOT MET 12/30/2017 Patient ambulates up to 250' with rollator walker or RW with prosthesis on paved surfaces or indoor surfaces with supervision / verbal cues on technique.    Time  12    Period  Weeks    Status  Not Met      PT LONG TERM GOAL #4   Title  Patient negotiates ramps, curbs & stairs with LRAD & prosthesis modified independent to enable community mobility.     Baseline  NOT MET 12/30/2017 Patient negotiates ramps, curbs with rollator walker or RW and prosthesis with minA and stairs with 2 rails or 1 rail & crutch with supervision.     Time  12    Period  Weeks    Status  Not Met      PT LONG TERM GOAL #5   Title  Berg Balance Test >/= 30/56    Baseline  NOT MET 12/30/2017  Merrilee Jansky Balance 10/56  Tasks with Merrilee Jansky test using RW for support 36/56    Time  12    Period  Weeks    Status  Not Met        PT Long Term Goals - 12/31/17 1518      PT LONG TERM GOAL #1   Title  Patient verbalizes & demonstrates proper prosthetic care to enable safe use of prosthesis. (All LTGs Target Date: 04/01/2018)    Time  3    Period  Months    Status  New    Target Date  04/01/18      PT LONG TERM GOAL #2   Title  Patient tolerates & reports prosthesis  wear daily >75% of awake hours without skin issues or limb pain.     Time  3    Period  Months    Status  New    Target Date  04/01/18      PT LONG TERM GOAL #3   Title  Patient ambulates 500' outdoors including grass with LRAD & prosthesis modified independent for community mobility.     Time  3    Period  Months    Status  On-going    Target Date  04/01/18      PT LONG TERM GOAL #4   Title  Patient negotiates ramps, curbs & stairs with LRAD & prosthesis modified independent to enable community mobility.     Time  3    Period  Months    Status  New    Target Date  04/01/18      PT LONG TERM GOAL #5   Title  Task of Berg Balance Test with walker support >45/56    Time  3    Period  Months    Target Date  04/01/18            Plan - 12/31/17 1500    Clinical Impression Statement  Patient did not meet LTGs set for initial Plan of care. She has made significant progress with function with her transfemoral prosthesis. Initially was w/c bound with assistance needed for donning prosthesis, all balance activities even with walker support required minimal assistance and prosthetic gait for 30' only requiring moderate assist. She now can ambulate 250' with supervision with walker support with instruction in technique. Her balance has improved to performing standing balance with walker support with supervision. Berg Balance test of 10/56 indicates high fall risk and tasks of Berg performed with walker support would be 36/56 which is significant improvement. Patient appears could improve function & safety with standing activities and gait with transfemoral prosthesis with additional skilled PT instruction.     Rehab Potential  Good    Clinical Impairments Affecting Rehab Potential  transfemoral level amputation with multiple medical issues, significantly limited activity level for >2 years    PT Frequency  2x / week    PT Duration  12 weeks    PT Treatment/Interventions  ADLs/Self Care  Home Management;Moist Heat;DME Instruction;Gait training;Stair training;Functional mobility training;Therapeutic activities;Therapeutic exercise;Balance training;Neuromuscular re-education;Patient/family education;Prosthetic Training;Manual techniques;Vestibular    PT Next Visit Plan  prosthetic gait with rollator walker including ramps & curbs, pt is planning vacation to visit family in [place], instruct in skills to maximize safety & function on trip    Consulted and Agree with Plan of Care  Patient       Patient will benefit from skilled therapeutic intervention in order to improve the following deficits and impairments:  Abnormal gait, Decreased activity tolerance, Decreased balance, Decreased endurance, Decreased knowledge of use of DME, Decreased mobility, Decreased strength, Decreased range of motion, Impaired flexibility, Prosthetic Dependency, Postural dysfunction  Visit Diagnosis: Other abnormalities of gait and mobility  Muscle weakness (generalized)  Unsteadiness on feet  Contracture of muscle, multiple sites  Pain in left leg  Abnormal posture     Problem List Patient Active Problem List   Diagnosis Date Noted  . Benign essential HTN   . Stage 5 chronic kidney disease not on chronic dialysis ([place])   . Above knee amputation status, left ([place])   . DM type 2 causing CKD stage 5 ([place]) 03/21/2017  . Anemia of chronic disease 02/05/2017  . Dysplasia of cervix, high grade CIN 2 01/12/2017  . Chronic diastolic CHF (congestive heart failure) ([place]) 11/08/2015  . Total knee replacement status   . Diabetic peripheral  neuropathy ([place])   . Major depression, recurrent, chronic ([place])   . Chronic kidney disease (CKD), stage IV (severe) ([place])   . Hepatitis C 08/16/2014  . Peripheral neuropathy 06/24/2013  . Compulsive tobacco user syndrome 06/24/2013    Jamey Reas PT, DPT 12/31/2017, 3:17 PM  [facility] [address]  [place], [place], [address] Phone: [phone]   Fax:  [phone]  Name: Brandy Houston MRN: [id number] Date of Birth: 07/08/1955

## 2018-01-01 ENCOUNTER — Other Ambulatory Visit: Payer: Self-pay

## 2018-01-01 ENCOUNTER — Encounter: Payer: Self-pay | Admitting: Vascular Surgery

## 2018-01-01 ENCOUNTER — Ambulatory Visit (INDEPENDENT_AMBULATORY_CARE_PROVIDER_SITE_OTHER): Payer: Self-pay | Admitting: Vascular Surgery

## 2018-01-01 ENCOUNTER — Encounter: Payer: Self-pay | Admitting: *Deleted

## 2018-01-01 ENCOUNTER — Other Ambulatory Visit: Payer: Self-pay | Admitting: *Deleted

## 2018-01-01 VITALS — BP 171/76 | HR 66 | Resp 18 | Ht 67.0 in | Wt 147.0 lb

## 2018-01-01 DIAGNOSIS — N185 Chronic kidney disease, stage 5: Secondary | ICD-10-CM

## 2018-01-05 ENCOUNTER — Ambulatory Visit: Payer: Medicare Other | Admitting: Physical Therapy

## 2018-01-05 ENCOUNTER — Encounter: Payer: Self-pay | Admitting: Physical Therapy

## 2018-01-05 DIAGNOSIS — R262 Difficulty in walking, not elsewhere classified: Secondary | ICD-10-CM | POA: Diagnosis not present

## 2018-01-05 DIAGNOSIS — M6249 Contracture of muscle, multiple sites: Secondary | ICD-10-CM

## 2018-01-05 DIAGNOSIS — M6281 Muscle weakness (generalized): Secondary | ICD-10-CM | POA: Diagnosis not present

## 2018-01-05 DIAGNOSIS — R2681 Unsteadiness on feet: Secondary | ICD-10-CM | POA: Diagnosis not present

## 2018-01-05 DIAGNOSIS — M79605 Pain in left leg: Secondary | ICD-10-CM

## 2018-01-05 DIAGNOSIS — R293 Abnormal posture: Secondary | ICD-10-CM

## 2018-01-05 DIAGNOSIS — R2689 Other abnormalities of gait and mobility: Secondary | ICD-10-CM | POA: Diagnosis not present

## 2018-01-05 NOTE — Therapy (Signed)
[facility] [address] [place], [place], [address] Phone: [phone]   Fax:  [phone]  Physical Therapy Treatment  Patient Details  Name: Brandy Houston MRN: [id number] Date of Birth: Feb 02, 1956 Referring Provider: Meridee Score, MD   Encounter Date: 01/05/2018  PT End of Session - 01/05/18 1102    Visit Number  23    Number of Visits  42    Date for PT Re-Evaluation  04/02/18    Authorization Type  Medicare & Medicaid    PT Start Time  1025    PT Stop Time  1100    PT Time Calculation (min)  35 min    Equipment Utilized During Treatment  Gait belt    Activity Tolerance  Patient tolerated treatment well;Patient limited by fatigue    Behavior During Therapy  [facility] for tasks assessed/performed       Past Medical History:  Diagnosis Date  . Anemia   . Anxiety   . Arthritis    "in my joints" (03/10/2017)  . Chronic diastolic CHF (congestive heart failure) ([place])   . CKD (chronic kidney disease), stage IV ([place])    stage IV. previous HD, none currently 10/30/15 (confirmed 02/05/2017 & 03/10/2017)  . Depression    Chronic  . Diabetic peripheral neuropathy ([place])   . Dysrhythmia    tachycardia, normal ECHO 08-09-14  . GERD (gastroesophageal reflux disease)   . Heart murmur     dx'd 02/05/2017  . Hepatitis C    "tx'd in 2016; I'm negative now" (02/05/2017)  . History of blood transfusion 2017   "w/knee replacement"  . Hypertension   . Osteoarthritis of left knee   . Peripheral neuropathy   . Protein calorie malnutrition ([place])   . Septic arthritis ([place])   . Slow transit constipation   . Type II diabetes mellitus ([name])    IDDM  . Uncontrolled hypertension 02/18/2013  . Unsteady gait     Past Surgical History:  Procedure Laterality Date  . AMPUTATION Left 03/20/2017   Procedure: LEFT ABOVE KNEE AMPUTATION;  Surgeon: Newt Minion, MD;  Location: [facility];  Service: Orthopedics;  Laterality: Left;  . APPENDECTOMY     . AV FISTULA PLACEMENT Left 09/13/2014   Procedure: Brachial Artery to Brachial Vein Gortex Four - Seven Stretch GRAFT INSERTION Left Forearm;  Surgeon: Mal Misty, MD;  Location: [facility];  Service: Vascular;  Laterality: Left;  . BASCILIC VEIN TRANSPOSITION Right 10/26/2017   Procedure: BASILIC VEIN TRANSPOSITION FIRST STAGE RIGHT ARM;  Surgeon: Conrad Stanton, MD;  Location: [facility];  Service: Vascular;  Laterality: Right;  . CHOLECYSTECTOMY OPEN    . COLON SURGERY    . EXCISIONAL TOTAL KNEE ARTHROPLASTY WITH ANTIBIOTIC SPACERS Left 02/06/2017   Procedure: Incisional total iknee with antibiotic spacer ;  Surgeon: Leandrew Koyanagi, MD;  Location: [facility];  Service: Orthopedics;  Laterality: Left;  . IR FLUORO GUIDE CV LINE RIGHT  02/10/2017  . IR REMOVAL TUN CV CATH W/O FL  04/01/2017  . IR US GUIDE VASC ACCESS RIGHT  02/10/2017  . IRRIGATION AND DEBRIDEMENT KNEE Left 03/12/2017   Procedure: IRRIGATION AND DEBRIDEMENT LEFT KNEE WITH WOUND VAC APPLICATION;  Surgeon: Leandrew Koyanagi, MD;  Location: [facility];  Service: Orthopedics;  Laterality: Left;  . JOINT REPLACEMENT    . KNEE ARTHROSCOPY Right 08/10/2014   Procedure: ARTHROSCOPY I & D KNEE;  Surgeon: Marianna Payment, MD;  [facility];  Service:  Orthopedics;  Laterality: Right;  . KNEE ARTHROSCOPY Left 08/11/2014   Procedure: ARTHROSCOPIC WASHOUT LEFT KNEE;  Surgeon: Marianna Payment, MD;  Location: [facility];  Service: Orthopedics;  Laterality: Left;  . KNEE ARTHROSCOPY Left 08/19/2014   Procedure: ARTHROSCOPIC WASHOUT LEFT KNEE;  Surgeon: Leandrew Koyanagi, MD;  Location: [facility];  Service: Orthopedics;  Laterality: Left;  . KNEE ARTHROSCOPY WITH LATERAL MENISECTOMY Left 04/04/2015   Procedure: AND PARTIAL LATERAL MENISECTOMY;  Surgeon: Leandrew Koyanagi, MD;  Location: [facility];  Service: Orthopedics;  Laterality: Left;  . KNEE ARTHROSCOPY WITH MEDIAL MENISECTOMY Left 04/04/2015   Procedure: LEFT KNEE ARTHROSCOPY WITH PARTIAL MEDIAL MENISCECTOMY   AND SYNOVECTOMY;  Surgeon: Leandrew Koyanagi, MD;  Location: [facility];  Service: Orthopedics;  Laterality: Left;  . SHOULDER ARTHROSCOPY Bilateral 08/10/2014   Procedure: I & D BILATERAL SHOULDERS ;  Surgeon: Marianna Payment, MD;  [facility];  Service: Orthopedics;  Laterality: Bilateral;  . SMALL INTESTINE SURGERY     Due to Small Bowel Obstruction; "fixed it when they did my gallbladder OR"  . TEE WITHOUT CARDIOVERSION N/A 08/14/2014   Procedure: TRANSESOPHAGEAL ECHOCARDIOGRAM (TEE);  Surgeon: Thayer Headings, MD;  Location: [facility];  Service: Cardiovascular;  Laterality: N/A;  . TENOSYNOVECTOMY Right 08/11/2014   Procedure: RIGHT WRIST IRRIGATION AND DEBRIDEMENT, TENOSYNOVECTOMY;  Surgeon: Marianna Payment, MD;  Location: [facility];  Service: Orthopedics;  Laterality: Right;  . TOTAL KNEE ARTHROPLASTY Left 11/07/2015   Procedure: LEFT TOTAL KNEE ARTHROPLASTY WITH REVISION OF IMPLANTS;  Surgeon: Leandrew Koyanagi, MD;  Location: [facility];  Service: Orthopedics;  Laterality: Left;  . TUBAL LIGATION      There were no vitals filed for this visit.  Subjective Assessment - 01/05/18 1025    Subjective  She reports working on wearing prosthesis everyday (half day yesterday due to phantom pain)  No falls.     Pertinent History  left TFA, DM, neuropathy, Hep C, HTN, CHF, depression, CKD st 5 not dialysis, arthritis, heart murmur,     Limitations  Standing;Walking;House hold activities    Patient Stated Goals  To walk with prosthesis in home & community, travel plane, car & cruise in September    Currently in Pain?  Yes    Pain Score  7     Pain Location  Leg    Pain Orientation  Left    Pain Descriptors / Indicators  -- Phantom Limb pain                       [facility] Adult PT Treatment/Exercise - 01/05/18 1110      Transfers   Transfers  Sit to Stand;Stand to Sit    Sit to Stand  4: Min guard;5: Supervision;With upper extremity assist;With armrests;From  chair/3-in-1    Sit to Stand Details  Verbal cues for safe use of DME/AE    Stand to Sit  5: Supervision;4: Min guard;With upper extremity assist;To chair/3-in-1    Stand to Sit Details (indicate cue type and reason)  Verbal cues for precautions/safety;Verbal cues for safe use of DME/AE      Ambulation/Gait   Ambulation/Gait  Yes    Ambulation/Gait Assistance  4: Min guard;4: Min assist    Ambulation/Gait Assistance Details  Min guard with Rollator and Min assist with Forearm crutches     Ambulation Distance (Feet)  209 Feet x 2 rollator & 115 with Forearm crutches using 3 pt pattern  Assistive device  Rollator;Lofstrands;Prosthesis    Gait Pattern  Step-through pattern;Decreased step length - right;Decreased step length - left;Decreased weight shift to left;Narrow base of support    Ambulation Surface  Level;Indoor    Ramp  4: Min assist    Ramp Details (indicate cue type and reason)  verbal cues on posture and technique with rollator    Curb  4: Min assist    Curb Details (indicate cue type and reason)  verbal cues on technique with rollator      Prosthetics   Current prosthetic wear tolerance (days/week)   reports trying to increase prosthetic wear to everyday, but due to Phantom limb pain it can limit wear time    Residual limb condition   intact per pt report               PT Short Term Goals - 12/31/17 1509      PT SHORT TERM GOAL #1   Title  Patient verbalizes initial understanding of need to adjust ply socks with limb volume changes. (All STGs Target Date: 02/05/2018)    Time  1    Period  Months    Status  New    Target Date  02/05/18      PT SHORT TERM GOAL #2   Title  patient reports prosthesis wear >5 days /wk for >/= 8 hrs/day    Time  1    Period  Months    Status  New    Target Date  02/05/18      PT SHORT TERM GOAL #3   Title  Patient picks up 3# objects from floor with RW support with prosthesis with supervision.     Time  1    Period  Months     Status  New    Target Date  02/05/18      PT SHORT TERM GOAL #4   Title  Patient ambulates 300' with rollator walker & prosthesis with supervision.     Time  1    Period  Months    Status  New    Target Date  02/05/18      PT SHORT TERM GOAL #5   Title  Patient negotiates stairs with 1 rail/crutch, ramp & curb with Rollator walker & prosthesis with supervision.     Time  1    Period  Months    Status  New    Target Date  02/05/18        PT Long Term Goals - 12/31/17 1518      PT LONG TERM GOAL #1   Title  Patient verbalizes & demonstrates proper prosthetic care to enable safe use of prosthesis. (All LTGs Target Date: 04/01/2018)    Time  3    Period  Months    Status  New    Target Date  04/01/18      PT LONG TERM GOAL #2   Title  Patient tolerates & reports prosthesis wear daily >75% of awake hours without skin issues or limb pain.     Time  3    Period  Months    Status  New    Target Date  04/01/18      PT LONG TERM GOAL #3   Title  Patient ambulates 500' outdoors including grass with LRAD & prosthesis modified independent for community mobility.     Time  3    Period  Months    Status  On-going  Target Date  04/01/18      PT LONG TERM GOAL #4   Title  Patient negotiates ramps, curbs & stairs with LRAD & prosthesis modified independent to enable community mobility.     Time  3    Period  Months    Status  New    Target Date  04/01/18      PT LONG TERM GOAL #5   Title  Task of Berg Balance Test with walker support >45/56    Time  3    Period  Months    Target Date  04/01/18            Plan - 01/05/18 1103    Clinical Impression Statement  Today's skilled session focused on prosthetic gait with rollator and forearm crutches for trip to [place] next week. Pt did well this session, increasing her stamina with ambulating with foerarm crutches to 115' without rest break in between. Pt is progressing and would benefit from continued PT inorder to meet  unmet goals and increase functional independence.    Rehab Potential  Good    Clinical Impairments Affecting Rehab Potential  transfemoral level amputation with multiple medical issues, significantly limited activity level for >2 years    PT Frequency  2x / week    PT Duration  12 weeks    PT Treatment/Interventions  ADLs/Self Care Home Management;Moist Heat;DME Instruction;Gait training;Stair training;Functional mobility training;Therapeutic activities;Therapeutic exercise;Balance training;Neuromuscular re-education;Patient/family education;Prosthetic Training;Manual techniques;Vestibular    PT Next Visit Plan  prosthetic gait with rollator walker including ramps & curbs, pt is planning vacation to visit family in [place], instruct in skills to maximize safety & function on trip. Prosthetic gait with forearm crutches using 3 point pattern.     Consulted and Agree with Plan of Care  Patient       Patient will benefit from skilled therapeutic intervention in order to improve the following deficits and impairments:  Abnormal gait, Decreased activity tolerance, Decreased balance, Decreased endurance, Decreased knowledge of use of DME, Decreased mobility, Decreased strength, Decreased range of motion, Impaired flexibility, Prosthetic Dependency, Postural dysfunction  Visit Diagnosis: Other abnormalities of gait and mobility  Muscle weakness (generalized)  Unsteadiness on feet  Contracture of muscle, multiple sites  Pain in left leg  Abnormal posture     Problem List Patient Active Problem List   Diagnosis Date Noted  . Benign essential HTN   . Stage 5 chronic kidney disease not on chronic dialysis ([place])   . Above knee amputation status, left ([place])   . DM type 2 causing CKD stage 5 ([place]) 03/21/2017  . Anemia of chronic disease 02/05/2017  . Dysplasia of cervix, high grade CIN 2 01/12/2017  . Chronic diastolic CHF (congestive heart failure) ([place]) 11/08/2015  . Total knee replacement  status   . Diabetic peripheral neuropathy ([place])   . Major depression, recurrent, chronic ([place])   . Chronic kidney disease (CKD), stage IV (severe) ([place])   . Hepatitis C 08/16/2014  . Peripheral neuropathy 06/24/2013  . Compulsive tobacco user syndrome 06/24/2013   Halina Andreas, SPTA 01/05/2018, 11:18 AM  [facility] [address] [place], [place], [address] Phone: [phone]   Fax:  [phone]  Name: JAMEELA MICHNA MRN: [id number] Date of Birth: Nov 14, 1955

## 2018-01-06 ENCOUNTER — Other Ambulatory Visit: Payer: Self-pay | Admitting: *Deleted

## 2018-01-07 ENCOUNTER — Encounter: Payer: Self-pay | Admitting: Physical Therapy

## 2018-01-07 ENCOUNTER — Ambulatory Visit: Payer: Medicare Other | Admitting: Physical Therapy

## 2018-01-07 DIAGNOSIS — M6249 Contracture of muscle, multiple sites: Secondary | ICD-10-CM | POA: Diagnosis not present

## 2018-01-07 DIAGNOSIS — R2689 Other abnormalities of gait and mobility: Secondary | ICD-10-CM

## 2018-01-07 DIAGNOSIS — R293 Abnormal posture: Secondary | ICD-10-CM

## 2018-01-07 DIAGNOSIS — R262 Difficulty in walking, not elsewhere classified: Secondary | ICD-10-CM | POA: Diagnosis not present

## 2018-01-07 DIAGNOSIS — R2681 Unsteadiness on feet: Secondary | ICD-10-CM

## 2018-01-07 DIAGNOSIS — M6281 Muscle weakness (generalized): Secondary | ICD-10-CM | POA: Diagnosis not present

## 2018-01-07 NOTE — Therapy (Signed)
[facility] [address] [place], [place], [address] Phone: [phone]   Fax:  [phone]  Physical Therapy Treatment  Patient Details  Name: Brandy Houston MRN: [id number] Date of Birth: 1955-08-13 Referring Provider: Meridee Score, MD   Encounter Date: 01/07/2018  PT End of Session - 01/07/18 1102    Visit Number  24    Number of Visits  42    Date for PT Re-Evaluation  04/02/18    Authorization Type  Medicare & Medicaid    PT Start Time  1027 Pt arrived late    PT Stop Time  1100    PT Time Calculation (min)  33 min    Equipment Utilized During Treatment  Gait belt    Activity Tolerance  Patient tolerated treatment well;Patient limited by fatigue    Behavior During Therapy  [facility] for tasks assessed/performed       Past Medical History:  Diagnosis Date  . Anemia   . Anxiety   . Arthritis    "in my joints" (03/10/2017)  . Chronic diastolic CHF (congestive heart failure) ([place])   . CKD (chronic kidney disease), stage IV ([place])    stage IV. previous HD, none currently 10/30/15 (confirmed 02/05/2017 & 03/10/2017)  . Depression    Chronic  . Diabetic peripheral neuropathy ([place])   . Dysrhythmia    tachycardia, normal ECHO 08-09-14  . GERD (gastroesophageal reflux disease)   . Heart murmur     dx'd 02/05/2017  . Hepatitis C    "tx'd in 2016; I'm negative now" (02/05/2017)  . History of blood transfusion 2017   "w/knee replacement"  . Hypertension   . Osteoarthritis of left knee   . Peripheral neuropathy   . Protein calorie malnutrition ([place])   . Septic arthritis ([place])   . Slow transit constipation   . Type II diabetes mellitus ([name])    IDDM  . Uncontrolled hypertension 02/18/2013  . Unsteady gait     Past Surgical History:  Procedure Laterality Date  . AMPUTATION Left 03/20/2017   Procedure: LEFT ABOVE KNEE AMPUTATION;  Surgeon: Newt Minion, MD;  Location: [facility];  Service: Orthopedics;  Laterality: Left;  .  APPENDECTOMY    . AV FISTULA PLACEMENT Left 09/13/2014   Procedure: Brachial Artery to Brachial Vein Gortex Four - Seven Stretch GRAFT INSERTION Left Forearm;  Surgeon: Mal Misty, MD;  Location: [facility];  Service: Vascular;  Laterality: Left;  . BASCILIC VEIN TRANSPOSITION Right 10/26/2017   Procedure: BASILIC VEIN TRANSPOSITION FIRST STAGE RIGHT ARM;  Surgeon: Conrad Sisters, MD;  Location: [facility];  Service: Vascular;  Laterality: Right;  . CHOLECYSTECTOMY OPEN    . COLON SURGERY    . EXCISIONAL TOTAL KNEE ARTHROPLASTY WITH ANTIBIOTIC SPACERS Left 02/06/2017   Procedure: Incisional total iknee with antibiotic spacer ;  Surgeon: Leandrew Koyanagi, MD;  Location: [facility];  Service: Orthopedics;  Laterality: Left;  . IR FLUORO GUIDE CV LINE RIGHT  02/10/2017  . IR REMOVAL TUN CV CATH W/O FL  04/01/2017  . IR US GUIDE VASC ACCESS RIGHT  02/10/2017  . IRRIGATION AND DEBRIDEMENT KNEE Left 03/12/2017   Procedure: IRRIGATION AND DEBRIDEMENT LEFT KNEE WITH WOUND VAC APPLICATION;  Surgeon: Leandrew Koyanagi, MD;  Location: [facility];  Service: Orthopedics;  Laterality: Left;  . JOINT REPLACEMENT    . KNEE ARTHROSCOPY Right 08/10/2014   Procedure: ARTHROSCOPY I & D KNEE;  Surgeon: Marianna Payment, MD;  [facility];  Service: Orthopedics;  Laterality: Right;  . KNEE ARTHROSCOPY Left 08/11/2014   Procedure: ARTHROSCOPIC WASHOUT LEFT KNEE;  Surgeon: Marianna Payment, MD;  Location: [facility];  Service: Orthopedics;  Laterality: Left;  . KNEE ARTHROSCOPY Left 08/19/2014   Procedure: ARTHROSCOPIC WASHOUT LEFT KNEE;  Surgeon: Leandrew Koyanagi, MD;  Location: [facility];  Service: Orthopedics;  Laterality: Left;  . KNEE ARTHROSCOPY WITH LATERAL MENISECTOMY Left 04/04/2015   Procedure: AND PARTIAL LATERAL MENISECTOMY;  Surgeon: Leandrew Koyanagi, MD;  Location: [facility];  Service: Orthopedics;  Laterality: Left;  . KNEE ARTHROSCOPY WITH MEDIAL MENISECTOMY Left 04/04/2015   Procedure: LEFT KNEE ARTHROSCOPY WITH PARTIAL  MEDIAL MENISCECTOMY  AND SYNOVECTOMY;  Surgeon: Leandrew Koyanagi, MD;  Location: [facility];  Service: Orthopedics;  Laterality: Left;  . SHOULDER ARTHROSCOPY Bilateral 08/10/2014   Procedure: I & D BILATERAL SHOULDERS ;  Surgeon: Marianna Payment, MD;  [facility];  Service: Orthopedics;  Laterality: Bilateral;  . SMALL INTESTINE SURGERY     Due to Small Bowel Obstruction; "fixed it when they did my gallbladder OR"  . TEE WITHOUT CARDIOVERSION N/A 08/14/2014   Procedure: TRANSESOPHAGEAL ECHOCARDIOGRAM (TEE);  Surgeon: Thayer Headings, MD;  Location: [facility];  Service: Cardiovascular;  Laterality: N/A;  . TENOSYNOVECTOMY Right 08/11/2014   Procedure: RIGHT WRIST IRRIGATION AND DEBRIDEMENT, TENOSYNOVECTOMY;  Surgeon: Marianna Payment, MD;  Location: [facility];  Service: Orthopedics;  Laterality: Right;  . TOTAL KNEE ARTHROPLASTY Left 11/07/2015   Procedure: LEFT TOTAL KNEE ARTHROPLASTY WITH REVISION OF IMPLANTS;  Surgeon: Leandrew Koyanagi, MD;  Location: [facility];  Service: Orthopedics;  Laterality: Left;  . TUBAL LIGATION      There were no vitals filed for this visit.  Subjective Assessment - 01/07/18 1041    Subjective  Pt got pain meds and is not in pain today.    Pertinent History  left TFA, DM, neuropathy, Hep C, HTN, CHF, depression, CKD st 5 not dialysis, arthritis, heart murmur,     Limitations  Standing;Walking;House hold activities    Patient Stated Goals  To walk with prosthesis in home & community, travel plane, car & cruise in September    Currently in Pain?  No/denies                       [facility] Adult PT Treatment/Exercise - 01/07/18 0001      Transfers   Transfers  Sit to Stand;Stand to Sit    Sit to Stand  5: Supervision    Sit to Stand Details  Verbal cues for safe use of DME/AE    Stand to Sit  5: Supervision    Stand to Sit Details (indicate cue type and reason)  Verbal cues for precautions/safety;Verbal cues for safe use of DME/AE       Ambulation/Gait   Ambulation/Gait  Yes    Ambulation/Gait Assistance  4: Min guard    Ambulation/Gait Assistance Details  training on uneven, paved surfaces; cues for upright gaze    Ambulation Distance (Feet)  200 Feet 115+ 50'    Assistive device  Rollator;Lofstrands;Prosthesis    Gait Pattern  Step-through pattern;Decreased step length - right;Decreased step length - left;Decreased weight shift to left;Narrow base of support    Ambulation Surface  Outdoor;Indoor;Unlevel;Level;Paved    Stairs  Yes    Stairs Assistance  5: Supervision    Stairs Assistance Details (indicate cue type and reason)  practised with 1  rail and SPC; cues to keep stepping with L LE when turning vs pivot    Stair Management Technique  One rail Right;With cane    Number of Stairs  4 x2    Ramp  4: Min assist    Ramp Details (indicate cue type and reason)  demonstrated safe technique with rollator    Curb  4: Min assist    Curb Details (indicate cue type and reason)  demonstrated safe technique with rollator             PT Education - 01/07/18 1212    Education provided  Yes    Education Details  Safety awareness for environment when traveling and encourage pt to talk to family to ensure appropriate supervison during gait when traveling.    Person(s) Educated  Patient    Methods  Explanation;Demonstration;Verbal cues    Comprehension  Verbalized understanding       PT Short Term Goals - 12/31/17 1509      PT SHORT TERM GOAL #1   Title  Patient verbalizes initial understanding of need to adjust ply socks with limb volume changes. (All STGs Target Date: 02/05/2018)    Time  1    Period  Months    Status  New    Target Date  02/05/18      PT SHORT TERM GOAL #2   Title  patient reports prosthesis wear >5 days /wk for >/= 8 hrs/day    Time  1    Period  Months    Status  New    Target Date  02/05/18      PT SHORT TERM GOAL #3   Title  Patient picks up 3# objects from floor with RW support with  prosthesis with supervision.     Time  1    Period  Months    Status  New    Target Date  02/05/18      PT SHORT TERM GOAL #4   Title  Patient ambulates 300' with rollator walker & prosthesis with supervision.     Time  1    Period  Months    Status  New    Target Date  02/05/18      PT SHORT TERM GOAL #5   Title  Patient negotiates stairs with 1 rail/crutch, ramp & curb with Rollator walker & prosthesis with supervision.     Time  1    Period  Months    Status  New    Target Date  02/05/18        PT Long Term Goals - 12/31/17 1518      PT LONG TERM GOAL #1   Title  Patient verbalizes & demonstrates proper prosthetic care to enable safe use of prosthesis. (All LTGs Target Date: 04/01/2018)    Time  3    Period  Months    Status  New    Target Date  04/01/18      PT LONG TERM GOAL #2   Title  Patient tolerates & reports prosthesis wear daily >75% of awake hours without skin issues or limb pain.     Time  3    Period  Months    Status  New    Target Date  04/01/18      PT LONG TERM GOAL #3   Title  Patient ambulates 500' outdoors including grass with LRAD & prosthesis modified independent for community mobility.     Time  3  Period  Months    Status  On-going    Target Date  04/01/18      PT LONG TERM GOAL #4   Title  Patient negotiates ramps, curbs & stairs with LRAD & prosthesis modified independent to enable community mobility.     Time  3    Period  Months    Status  New    Target Date  04/01/18      PT LONG TERM GOAL #5   Title  Task of Berg Balance Test with walker support >45/56    Time  3    Period  Months    Target Date  04/01/18            Plan - 01/07/18 1208    Clinical Impression Statement  Todays skilled session focused on gait training with rollator negotiating community barriers and outdoor surfaces; pt requires supervision for  balance and cues for upright gaze,     Rehab Potential  Good    Clinical Impairments Affecting Rehab  Potential  transfemoral level amputation with multiple medical issues, significantly limited activity level for >2 years    PT Frequency  2x / week    PT Duration  12 weeks    PT Treatment/Interventions  ADLs/Self Care Home Management;Moist Heat;DME Instruction;Gait training;Stair training;Functional mobility training;Therapeutic activities;Therapeutic exercise;Balance training;Neuromuscular re-education;Patient/family education;Prosthetic Training;Manual techniques;Vestibular    PT Next Visit Plan  Prosthetic gait with forearm crutches using 3 point pattern.     Consulted and Agree with Plan of Care  Patient       Patient will benefit from skilled therapeutic intervention in order to improve the following deficits and impairments:  Abnormal gait, Decreased activity tolerance, Decreased balance, Decreased endurance, Decreased knowledge of use of DME, Decreased mobility, Decreased strength, Decreased range of motion, Impaired flexibility, Prosthetic Dependency, Postural dysfunction  Visit Diagnosis: Other abnormalities of gait and mobility  Muscle weakness (generalized)  Unsteadiness on feet  Abnormal posture  Difficulty in walking, not elsewhere classified     Problem List Patient Active Problem List   Diagnosis Date Noted  . Benign essential HTN   . Stage 5 chronic kidney disease not on chronic dialysis ([place])   . Above knee amputation status, left ([place])   . DM type 2 causing CKD stage 5 ([place]) 03/21/2017  . Anemia of chronic disease 02/05/2017  . Dysplasia of cervix, high grade CIN 2 01/12/2017  . Chronic diastolic CHF (congestive heart failure) ([place]) 11/08/2015  . Total knee replacement status   . Diabetic peripheral neuropathy ([place])   . Major depression, recurrent, chronic ([place])   . Chronic kidney disease (CKD), stage IV (severe) ([place])   . Hepatitis C 08/16/2014  . Peripheral neuropathy 06/24/2013  . Compulsive tobacco user syndrome 06/24/2013    Bjorn Loser, PTA   01/07/18, 12:20 PM [facility] [address] [place], [place], [address] Phone: [phone]   Fax:  [phone]  Name: Brandy Houston MRN: [id number] Date of Birth: 05/07/1956

## 2018-01-08 ENCOUNTER — Encounter (HOSPITAL_COMMUNITY): Payer: Medicare Other

## 2018-01-26 ENCOUNTER — Other Ambulatory Visit (HOSPITAL_COMMUNITY): Payer: Self-pay | Admitting: *Deleted

## 2018-01-26 ENCOUNTER — Ambulatory Visit: Payer: Medicare Other | Attending: Orthopedic Surgery | Admitting: Physical Therapy

## 2018-01-26 ENCOUNTER — Encounter: Payer: Self-pay | Admitting: Physical Therapy

## 2018-01-26 DIAGNOSIS — R293 Abnormal posture: Secondary | ICD-10-CM

## 2018-01-26 DIAGNOSIS — R2689 Other abnormalities of gait and mobility: Secondary | ICD-10-CM | POA: Insufficient documentation

## 2018-01-26 DIAGNOSIS — M6281 Muscle weakness (generalized): Secondary | ICD-10-CM | POA: Diagnosis not present

## 2018-01-26 DIAGNOSIS — R2681 Unsteadiness on feet: Secondary | ICD-10-CM | POA: Diagnosis not present

## 2018-01-26 NOTE — Therapy (Signed)
[facility] [address] [place], [place], [address] Phone: [phone]   Fax:  [phone]  Physical Therapy Treatment  Patient Details  Name: Brandy Houston MRN: [id number] Date of Birth: August 18, 1955 Referring Provider: Meridee Score, MD   Encounter Date: 01/26/2018  PT End of Session - 01/26/18 0849    Visit Number  25    Number of Visits  42    Date for PT Re-Evaluation  04/02/18    Authorization Type  Medicare & Medicaid    PT Start Time  0812   late arrival   PT Stop Time  0843    PT Time Calculation (min)  31 min    Equipment Utilized During Treatment  Gait belt    Activity Tolerance  Patient tolerated treatment well;Patient limited by fatigue    Behavior During Therapy  [facility] for tasks assessed/performed       Past Medical History:  Diagnosis Date  . Anemia   . Anxiety   . Arthritis    "in my joints" (03/10/2017)  . Chronic diastolic CHF (congestive heart failure) ([place])   . CKD (chronic kidney disease), stage IV ([place])    stage IV. previous HD, none currently 10/30/15 (confirmed 02/05/2017 & 03/10/2017)  . Depression    Chronic  . Diabetic peripheral neuropathy ([place])   . Dysrhythmia    tachycardia, normal ECHO 08-09-14  . GERD (gastroesophageal reflux disease)   . Heart murmur     dx'd 02/05/2017  . Hepatitis C    "tx'd in 2016; I'm negative now" (02/05/2017)  . History of blood transfusion 2017   "w/knee replacement"  . Hypertension   . Osteoarthritis of left knee   . Peripheral neuropathy   . Protein calorie malnutrition ([place])   . Septic arthritis ([place])   . Slow transit constipation   . Type II diabetes mellitus ([name])    IDDM  . Uncontrolled hypertension 02/18/2013  . Unsteady gait     Past Surgical History:  Procedure Laterality Date  . AMPUTATION Left 03/20/2017   Procedure: LEFT ABOVE KNEE AMPUTATION;  Surgeon: Newt Minion, MD;  Location: [facility];  Service: Orthopedics;  Laterality: Left;  .  APPENDECTOMY    . AV FISTULA PLACEMENT Left 09/13/2014   Procedure: Brachial Artery to Brachial Vein Gortex Four - Seven Stretch GRAFT INSERTION Left Forearm;  Surgeon: Mal Misty, MD;  Location: [facility];  Service: Vascular;  Laterality: Left;  . BASCILIC VEIN TRANSPOSITION Right 10/26/2017   Procedure: BASILIC VEIN TRANSPOSITION FIRST STAGE RIGHT ARM;  Surgeon: Conrad , MD;  Location: [facility];  Service: Vascular;  Laterality: Right;  . CHOLECYSTECTOMY OPEN    . COLON SURGERY    . EXCISIONAL TOTAL KNEE ARTHROPLASTY WITH ANTIBIOTIC SPACERS Left 02/06/2017   Procedure: Incisional total iknee with antibiotic spacer ;  Surgeon: Leandrew Koyanagi, MD;  Location: [facility];  Service: Orthopedics;  Laterality: Left;  . IR FLUORO GUIDE CV LINE RIGHT  02/10/2017  . IR REMOVAL TUN CV CATH W/O FL  04/01/2017  . IR US GUIDE VASC ACCESS RIGHT  02/10/2017  . IRRIGATION AND DEBRIDEMENT KNEE Left 03/12/2017   Procedure: IRRIGATION AND DEBRIDEMENT LEFT KNEE WITH WOUND VAC APPLICATION;  Surgeon: Leandrew Koyanagi, MD;  Location: [facility];  Service: Orthopedics;  Laterality: Left;  . JOINT REPLACEMENT    . KNEE ARTHROSCOPY Right 08/10/2014   Procedure: ARTHROSCOPY I & D KNEE;  Surgeon: Marianna Payment, MD;  Location[facility];  Service: Orthopedics;  Laterality: Right;  . KNEE ARTHROSCOPY Left 08/11/2014   Procedure: ARTHROSCOPIC WASHOUT LEFT KNEE;  Surgeon: Marianna Payment, MD;  Location: [facility];  Service: Orthopedics;  Laterality: Left;  . KNEE ARTHROSCOPY Left 08/19/2014   Procedure: ARTHROSCOPIC WASHOUT LEFT KNEE;  Surgeon: Leandrew Koyanagi, MD;  Location: [facility];  Service: Orthopedics;  Laterality: Left;  . KNEE ARTHROSCOPY WITH LATERAL MENISECTOMY Left 04/04/2015   Procedure: AND PARTIAL LATERAL MENISECTOMY;  Surgeon: Leandrew Koyanagi, MD;  Location: [facility];  Service: Orthopedics;  Laterality: Left;  . KNEE ARTHROSCOPY WITH MEDIAL MENISECTOMY Left 04/04/2015   Procedure: LEFT KNEE ARTHROSCOPY WITH PARTIAL  MEDIAL MENISCECTOMY  AND SYNOVECTOMY;  Surgeon: Leandrew Koyanagi, MD;  Location: [facility];  Service: Orthopedics;  Laterality: Left;  . SHOULDER ARTHROSCOPY Bilateral 08/10/2014   Procedure: I & D BILATERAL SHOULDERS ;  Surgeon: Marianna Payment, MD;  Location: [facility];  Service: Orthopedics;  Laterality: Bilateral;  . SMALL INTESTINE SURGERY     Due to Small Bowel Obstruction; "fixed it when they did my gallbladder OR"  . TEE WITHOUT CARDIOVERSION N/A 08/14/2014   Procedure: TRANSESOPHAGEAL ECHOCARDIOGRAM (TEE);  Surgeon: Thayer Headings, MD;  Location: [facility];  Service: Cardiovascular;  Laterality: N/A;  . TENOSYNOVECTOMY Right 08/11/2014   Procedure: RIGHT WRIST IRRIGATION AND DEBRIDEMENT, TENOSYNOVECTOMY;  Surgeon: Marianna Payment, MD;  Location: [facility];  Service: Orthopedics;  Laterality: Right;  . TOTAL KNEE ARTHROPLASTY Left 11/07/2015   Procedure: LEFT TOTAL KNEE ARTHROPLASTY WITH REVISION OF IMPLANTS;  Surgeon: Leandrew Koyanagi, MD;  Location: [facility];  Service: Orthopedics;  Laterality: Left;  . TUBAL LIGATION      There were no vitals filed for this visit.  Subjective Assessment - 01/26/18 0815    Subjective  Did well in [place] except for getting tired with walking. Stayed on 3rd floor at her sister's and did well on the stairs.     Pertinent History  left TFA, DM, neuropathy, Hep C, HTN, CHF, depression, CKD st 5 not dialysis, arthritis, heart murmur,     Limitations  Standing;Walking;House hold activities    Patient Stated Goals  To walk with prosthesis in home & community, travel plane, car & cruise in September    Currently in Pain?  No/denies                       [facility] Adult PT Treatment/Exercise - 01/26/18 0817      Transfers   Transfers  Sit to Stand;Stand to Sit    Sit to Stand  5: Supervision    Sit to Stand Details (indicate cue type and reason)  supervision without cues with lofstrand    Stand to Sit  5: Supervision;4: Min guard     Stand to Sit Details  vc for sequencing with lofstrands; x3 reps, by last rep did not need cues for safe technique      Ambulation/Gait   Ambulation/Gait Assistance  4: Min guard;4: Min assist    Ambulation Distance (Feet)  120 Feet   100, 50 x 2 (standing rest break)    Assistive device  Rollator;Lofstrands;Prosthesis    Gait Pattern  Step-through pattern;Decreased step length - right;Decreased step length - left;Decreased weight shift to left;Narrow base of support    Ambulation Surface  Indoor;Level    Stairs Assistance  4: Min guard;5: Supervision    Stairs Assistance Details (indicate cue type and reason)  left rail and lofstrand crutch    Stair Management Technique  One rail Left;Step to pattern;Forwards   lofstrand crutch   Number of Stairs  4   x 2   Height of Stairs  6      Exercises   Exercises  Other Exercises    Other Exercises   in // bars up/down 8" step for strengthening RLE x 10               PT Short Term Goals - 12/31/17 1509      PT SHORT TERM GOAL #1   Title  Patient verbalizes initial understanding of need to adjust ply socks with limb volume changes. (All STGs Target Date: 02/05/2018)    Time  1    Period  Months    Status  New    Target Date  02/05/18      PT SHORT TERM GOAL #2   Title  patient reports prosthesis wear >5 days /wk for >/= 8 hrs/day    Time  1    Period  Months    Status  New    Target Date  02/05/18      PT SHORT TERM GOAL #3   Title  Patient picks up 3# objects from floor with RW support with prosthesis with supervision.     Time  1    Period  Months    Status  New    Target Date  02/05/18      PT SHORT TERM GOAL #4   Title  Patient ambulates 300' with rollator walker & prosthesis with supervision.     Time  1    Period  Months    Status  New    Target Date  02/05/18      PT SHORT TERM GOAL #5   Title  Patient negotiates stairs with 1 rail/crutch, ramp & curb with Rollator walker & prosthesis with supervision.      Time  1    Period  Months    Status  New    Target Date  02/05/18        PT Long Term Goals - 12/31/17 1518      PT LONG TERM GOAL #1   Title  Patient verbalizes & demonstrates proper prosthetic care to enable safe use of prosthesis. (All LTGs Target Date: 04/01/2018)    Time  3    Period  Months    Status  New    Target Date  04/01/18      PT LONG TERM GOAL #2   Title  Patient tolerates & reports prosthesis wear daily >75% of awake hours without skin issues or limb pain.     Time  3    Period  Months    Status  New    Target Date  04/01/18      PT LONG TERM GOAL #3   Title  Patient ambulates 500' outdoors including grass with LRAD & prosthesis modified independent for community mobility.     Time  3    Period  Months    Status  On-going    Target Date  04/01/18      PT LONG TERM GOAL #4   Title  Patient negotiates ramps, curbs & stairs with LRAD & prosthesis modified independent to enable community mobility.     Time  3    Period  Months    Status  New    Target Date  04/01/18  PT LONG TERM GOAL #5   Title  Task of Berg Balance Test with walker support >45/56    Time  3    Period  Months    Target Date  04/01/18            Plan - 01/26/18 0900    Clinical Impression Statement  Pt returned from 2 weeks in [place]. She reports she had no mobility issues (other than her sister making her stay on the 3rd floor and doing LOTS of stairs with one rail only!) She had a rollator she borrowed while there and took her wheelchair. She helped her sister in the kitchen with only using the counter for support. Today focused on gait training with lofstrand crutches for incr distance, upright posture, less support through UEs, and stair training. Up down 8" single step (in curb like manner) for RLE strengthening. Patient reports primary therapist is considering having her get loftstrand crutches, but is looking into coverage by insurance/cost to pt. She is progressing well.  (Note also discussed her upcoming procedure on RUE fistula on 8/191/19. She is unclear how long she will be on hold for PT and will call office once she has that information).     Rehab Potential  Good    Clinical Impairments Affecting Rehab Potential  transfemoral level amputation with multiple medical issues, significantly limited activity level for >2 years    PT Frequency  2x / week    PT Duration  12 weeks    PT Treatment/Interventions  ADLs/Self Care Home Management;Moist Heat;DME Instruction;Gait training;Stair training;Functional mobility training;Therapeutic activities;Therapeutic exercise;Balance training;Neuromuscular re-education;Patient/family education;Prosthetic Training;Manual techniques;Vestibular    PT Next Visit Plan  Check if limitations/restrictions on RUE s/p procedure; Prosthetic gait with forearm crutches using 3 point pattern.     Consulted and Agree with Plan of Care  Patient       Patient will benefit from skilled therapeutic intervention in order to improve the following deficits and impairments:  Abnormal gait, Decreased activity tolerance, Decreased balance, Decreased endurance, Decreased knowledge of use of DME, Decreased mobility, Decreased strength, Decreased range of motion, Impaired flexibility, Prosthetic Dependency, Postural dysfunction  Visit Diagnosis: Other abnormalities of gait and mobility  Muscle weakness (generalized)  Abnormal posture     Problem List Patient Active Problem List   Diagnosis Date Noted  . Benign essential HTN   . Stage 5 chronic kidney disease not on chronic dialysis ([place])   . Above knee amputation status, left ([place])   . DM type 2 causing CKD stage 5 ([place]) 03/21/2017  . Anemia of chronic disease 02/05/2017  . Dysplasia of cervix, high grade CIN 2 01/12/2017  . Chronic diastolic CHF (congestive heart failure) ([place]) 11/08/2015  . Total knee replacement status   . Diabetic peripheral neuropathy ([place])   . Major depression,  recurrent, chronic ([place])   . Chronic kidney disease (CKD), stage IV (severe) ([place])   . Hepatitis C 08/16/2014  . Peripheral neuropathy 06/24/2013  . Compulsive tobacco user syndrome 06/24/2013    Rexanne Mano, PT 01/26/2018, 9:06 AM  [facility] [address] [place], [place], [address] Phone: [phone]   Fax:  [phone]  Name: Brandy Houston MRN: [id number] Date of Birth: Jul 03, 1955

## 2018-01-27 ENCOUNTER — Ambulatory Visit (HOSPITAL_COMMUNITY)
Admission: RE | Admit: 2018-01-27 | Discharge: 2018-01-27 | Disposition: A | Discharge status: Home / Self Care | Payer: Medicare Other | Source: Ambulatory Visit | Attending: Nephrology | Admitting: Nephrology

## 2018-01-27 VITALS — BP 169/78 | HR 66 | Temp 98.2°F | Resp 16

## 2018-01-27 DIAGNOSIS — D631 Anemia in chronic kidney disease: Secondary | ICD-10-CM | POA: Diagnosis not present

## 2018-01-27 DIAGNOSIS — N184 Chronic kidney disease, stage 4 (severe): Secondary | ICD-10-CM

## 2018-01-27 LAB — IRON AND TIBC
Iron: 46 ug/dL (ref 28–170)
Saturation Ratios: 15 % (ref 10.4–31.8)
TIBC: 307 ug/dL (ref 250–450)
UIBC: 261 ug/dL

## 2018-01-27 LAB — FERRITIN: Ferritin: 130 ng/mL (ref 11–307)

## 2018-01-27 LAB — POCT HEMOGLOBIN-HEMACUE: Hemoglobin: 9.2 g/dL — ABNORMAL LOW (ref 12.0–15.0)

## 2018-01-27 MED ORDER — SODIUM CHLORIDE 0.9 % IV SOLN
510.0000 mg | Freq: Once | INTRAVENOUS | Status: AC
  Administered 2018-01-27: 510 mg via INTRAVENOUS
  Filled 2018-01-27: qty 17

## 2018-01-27 MED ORDER — DARBEPOETIN ALFA 150 MCG/0.3ML IJ SOSY
150.0000 ug | PREFILLED_SYRINGE | INTRAMUSCULAR | Status: DC
Start: 2018-01-27 — End: 2018-01-28
  Administered 2018-01-27: 150 ug via SUBCUTANEOUS

## 2018-01-27 MED ORDER — DARBEPOETIN ALFA 150 MCG/0.3ML IJ SOSY
PREFILLED_SYRINGE | INTRAMUSCULAR | Status: AC
  Filled 2018-01-27: qty 0.3

## 2018-01-28 ENCOUNTER — Ambulatory Visit: Payer: Medicare Other | Admitting: Physical Therapy

## 2018-01-28 DIAGNOSIS — I120 Hypertensive chronic kidney disease with stage 5 chronic kidney disease or end stage renal disease: Secondary | ICD-10-CM | POA: Diagnosis not present

## 2018-01-28 DIAGNOSIS — N2581 Secondary hyperparathyroidism of renal origin: Secondary | ICD-10-CM | POA: Diagnosis not present

## 2018-01-28 DIAGNOSIS — D631 Anemia in chronic kidney disease: Secondary | ICD-10-CM | POA: Diagnosis not present

## 2018-01-28 DIAGNOSIS — G629 Polyneuropathy, unspecified: Secondary | ICD-10-CM | POA: Diagnosis not present

## 2018-01-28 DIAGNOSIS — N185 Chronic kidney disease, stage 5: Secondary | ICD-10-CM | POA: Diagnosis not present

## 2018-01-28 DIAGNOSIS — N049 Nephrotic syndrome with unspecified morphologic changes: Secondary | ICD-10-CM | POA: Diagnosis not present

## 2018-01-28 DIAGNOSIS — E1122 Type 2 diabetes mellitus with diabetic chronic kidney disease: Secondary | ICD-10-CM | POA: Diagnosis not present

## 2018-01-28 LAB — BASIC METABOLIC PANEL
BUN: 92 — AB (ref 4–21)
Creatinine: 5.6 — AB (ref <=1.1)
Glucose: 104
Potassium: 3.4 (ref 3.4–5.3)
Sodium: 137 (ref 137–147)

## 2018-01-29 ENCOUNTER — Encounter (HOSPITAL_COMMUNITY): Payer: Self-pay | Admitting: *Deleted

## 2018-01-29 ENCOUNTER — Other Ambulatory Visit: Payer: Self-pay

## 2018-01-29 NOTE — Progress Notes (Signed)
Spoke with pt for pre-op call. Pt denies cardiac history, chest pain or sob. Pt is a type 2 diabetic. Last A1C was 6.1 on 11/25/17. Pt states her fasting blood sugar is usually in the 130's. Pt instructed to take 1/2 of her regular dose of Lantus Monday AM (will take 5 units). Pt instructed to check her blood sugar when she gets up and every 2 hours until she leaves for the hospital. If blood sugar is 70 or below, treat with 1/2 cup of clear juice (apple or cranberry) and recheck blood sugar 15 minutes after drinking juice. If blood sugar continues to be 70 or below, call the Short Stay department and ask to speak to a nurse. Pt voiced understanding.

## 2018-01-31 ENCOUNTER — Other Ambulatory Visit (INDEPENDENT_AMBULATORY_CARE_PROVIDER_SITE_OTHER): Payer: Self-pay | Admitting: Orthopedic Surgery

## 2018-01-31 ENCOUNTER — Other Ambulatory Visit (INDEPENDENT_AMBULATORY_CARE_PROVIDER_SITE_OTHER): Payer: Self-pay | Admitting: Orthopaedic Surgery

## 2018-02-01 ENCOUNTER — Ambulatory Visit (HOSPITAL_COMMUNITY): Payer: Medicare Other | Admitting: Anesthesiology

## 2018-02-01 ENCOUNTER — Telehealth: Payer: Self-pay | Admitting: Vascular Surgery

## 2018-02-01 ENCOUNTER — Ambulatory Visit (HOSPITAL_COMMUNITY)
Admission: RE | Admit: 2018-02-01 | Discharge: 2018-02-01 | Disposition: A | Discharge status: Home / Self Care | Payer: Medicare Other | Source: Ambulatory Visit | Attending: Vascular Surgery | Admitting: Vascular Surgery

## 2018-02-01 ENCOUNTER — Encounter (HOSPITAL_COMMUNITY): Payer: Self-pay | Admitting: *Deleted

## 2018-02-01 ENCOUNTER — Encounter (HOSPITAL_COMMUNITY)
Admission: RE | Disposition: A | Discharge status: Home / Self Care | Payer: Self-pay | Source: Ambulatory Visit | Attending: Vascular Surgery

## 2018-02-01 ENCOUNTER — Other Ambulatory Visit: Payer: Self-pay

## 2018-02-01 DIAGNOSIS — Z91041 Radiographic dye allergy status: Secondary | ICD-10-CM | POA: Insufficient documentation

## 2018-02-01 DIAGNOSIS — Z91013 Allergy to seafood: Secondary | ICD-10-CM | POA: Insufficient documentation

## 2018-02-01 DIAGNOSIS — E1122 Type 2 diabetes mellitus with diabetic chronic kidney disease: Secondary | ICD-10-CM | POA: Diagnosis not present

## 2018-02-01 DIAGNOSIS — Z888 Allergy status to other drugs, medicaments and biological substances status: Secondary | ICD-10-CM | POA: Diagnosis not present

## 2018-02-01 DIAGNOSIS — F329 Major depressive disorder, single episode, unspecified: Secondary | ICD-10-CM | POA: Diagnosis not present

## 2018-02-01 DIAGNOSIS — Z794 Long term (current) use of insulin: Secondary | ICD-10-CM | POA: Diagnosis not present

## 2018-02-01 DIAGNOSIS — F419 Anxiety disorder, unspecified: Secondary | ICD-10-CM | POA: Diagnosis not present

## 2018-02-01 DIAGNOSIS — M1990 Unspecified osteoarthritis, unspecified site: Secondary | ICD-10-CM | POA: Diagnosis not present

## 2018-02-01 DIAGNOSIS — Z9889 Other specified postprocedural states: Secondary | ICD-10-CM | POA: Insufficient documentation

## 2018-02-01 DIAGNOSIS — Z9049 Acquired absence of other specified parts of digestive tract: Secondary | ICD-10-CM | POA: Insufficient documentation

## 2018-02-01 DIAGNOSIS — Z8249 Family history of ischemic heart disease and other diseases of the circulatory system: Secondary | ICD-10-CM | POA: Diagnosis not present

## 2018-02-01 DIAGNOSIS — Z89612 Acquired absence of left leg above knee: Secondary | ICD-10-CM | POA: Insufficient documentation

## 2018-02-01 DIAGNOSIS — I130 Hypertensive heart and chronic kidney disease with heart failure and stage 1 through stage 4 chronic kidney disease, or unspecified chronic kidney disease: Secondary | ICD-10-CM | POA: Insufficient documentation

## 2018-02-01 DIAGNOSIS — I5032 Chronic diastolic (congestive) heart failure: Secondary | ICD-10-CM | POA: Insufficient documentation

## 2018-02-01 DIAGNOSIS — Z79899 Other long term (current) drug therapy: Secondary | ICD-10-CM | POA: Insufficient documentation

## 2018-02-01 DIAGNOSIS — Z9851 Tubal ligation status: Secondary | ICD-10-CM | POA: Insufficient documentation

## 2018-02-01 DIAGNOSIS — K219 Gastro-esophageal reflux disease without esophagitis: Secondary | ICD-10-CM | POA: Insufficient documentation

## 2018-02-01 DIAGNOSIS — Z8619 Personal history of other infectious and parasitic diseases: Secondary | ICD-10-CM | POA: Insufficient documentation

## 2018-02-01 DIAGNOSIS — N184 Chronic kidney disease, stage 4 (severe): Secondary | ICD-10-CM | POA: Insufficient documentation

## 2018-02-01 DIAGNOSIS — N185 Chronic kidney disease, stage 5: Secondary | ICD-10-CM | POA: Diagnosis not present

## 2018-02-01 DIAGNOSIS — F17210 Nicotine dependence, cigarettes, uncomplicated: Secondary | ICD-10-CM | POA: Insufficient documentation

## 2018-02-01 DIAGNOSIS — I132 Hypertensive heart and chronic kidney disease with heart failure and with stage 5 chronic kidney disease, or end stage renal disease: Secondary | ICD-10-CM | POA: Diagnosis not present

## 2018-02-01 DIAGNOSIS — E1142 Type 2 diabetes mellitus with diabetic polyneuropathy: Secondary | ICD-10-CM | POA: Insufficient documentation

## 2018-02-01 DIAGNOSIS — Z7982 Long term (current) use of aspirin: Secondary | ICD-10-CM | POA: Diagnosis not present

## 2018-02-01 DIAGNOSIS — Z882 Allergy status to sulfonamides status: Secondary | ICD-10-CM | POA: Diagnosis not present

## 2018-02-01 DIAGNOSIS — Z833 Family history of diabetes mellitus: Secondary | ICD-10-CM | POA: Insufficient documentation

## 2018-02-01 HISTORY — PX: BASCILIC VEIN TRANSPOSITION: SHX5742

## 2018-02-01 LAB — HEMOGLOBIN A1C
Hgb A1c MFr Bld: 6.2 % — ABNORMAL HIGH (ref 4.8–5.6)
Mean Plasma Glucose: 131.24 mg/dL

## 2018-02-01 LAB — GLUCOSE, CAPILLARY
Glucose-Capillary: 103 mg/dL — ABNORMAL HIGH (ref 70–99)
Glucose-Capillary: 85 mg/dL (ref 70–99)

## 2018-02-01 LAB — POCT I-STAT 4, (NA,K, GLUC, HGB,HCT)
Glucose, Bld: 112 mg/dL — ABNORMAL HIGH (ref 70–99)
HCT: 30 % — ABNORMAL LOW (ref 36.0–46.0)
Hemoglobin: 10.2 g/dL — ABNORMAL LOW (ref 12.0–15.0)
Potassium: 3.2 mmol/L — ABNORMAL LOW (ref 3.5–5.1)
Sodium: 143 mmol/L (ref 135–145)

## 2018-02-01 SURGERY — TRANSPOSITION, VEIN, BASILIC
Anesthesia: Monitor Anesthesia Care | Site: Arm Upper | Laterality: Right

## 2018-02-01 MED ORDER — MIDAZOLAM HCL 2 MG/2ML IJ SOLN
INTRAMUSCULAR | Status: AC
  Filled 2018-02-01: qty 2

## 2018-02-01 MED ORDER — 0.9 % SODIUM CHLORIDE (POUR BTL) OPTIME
TOPICAL | Status: DC | PRN
  Administered 2018-02-01: 1000 mL

## 2018-02-01 MED ORDER — OXYCODONE HCL 5 MG PO TABS
5.0000 mg | ORAL_TABLET | Freq: Once | ORAL | Status: AC | PRN
  Administered 2018-02-01: 5 mg via ORAL

## 2018-02-01 MED ORDER — SODIUM CHLORIDE 0.9 % IV SOLN
INTRAVENOUS | Status: DC | PRN
  Administered 2018-02-01: 500 mL

## 2018-02-01 MED ORDER — CHLORHEXIDINE GLUCONATE 4 % EX LIQD: 60.0000 mL | Freq: Once | CUTANEOUS | Status: DC

## 2018-02-01 MED ORDER — SODIUM CHLORIDE 0.9 % IV SOLN
INTRAVENOUS | Status: DC | PRN
  Administered 2018-02-01: 25 ug/min via INTRAVENOUS

## 2018-02-01 MED ORDER — PROTAMINE SULFATE 10 MG/ML IV SOLN
INTRAVENOUS | Status: DC | PRN
  Administered 2018-02-01: 20 mg via INTRAVENOUS

## 2018-02-01 MED ORDER — CLONIDINE HCL 0.2 MG PO TABS: 0.2000 mg | ORAL_TABLET | Freq: Once | ORAL | Status: DC

## 2018-02-01 MED ORDER — SODIUM CHLORIDE 0.9 % IV SOLN
INTRAVENOUS | Status: AC
  Filled 2018-02-01: qty 1.2

## 2018-02-01 MED ORDER — LIDOCAINE 2% (20 MG/ML) 5 ML SYRINGE
INTRAMUSCULAR | Status: AC
  Filled 2018-02-01: qty 5

## 2018-02-01 MED ORDER — CLONIDINE HCL 0.2 MG PO TABS
ORAL_TABLET | ORAL | Status: AC
  Administered 2018-02-01: 0.2 mg
  Filled 2018-02-01: qty 1

## 2018-02-01 MED ORDER — HEMOSTATIC AGENTS (NO CHARGE) OPTIME
TOPICAL | Status: DC | PRN
  Administered 2018-02-01: 1 via TOPICAL

## 2018-02-01 MED ORDER — ONDANSETRON HCL 4 MG/2ML IJ SOLN
INTRAMUSCULAR | Status: AC
  Filled 2018-02-01: qty 2

## 2018-02-01 MED ORDER — PROTAMINE SULFATE 10 MG/ML IV SOLN
INTRAVENOUS | Status: AC
Start: 2018-02-01 — End: ?
  Filled 2018-02-01: qty 5

## 2018-02-01 MED ORDER — LIDOCAINE HCL (CARDIAC) PF 100 MG/5ML IV SOSY
PREFILLED_SYRINGE | INTRAVENOUS | Status: DC | PRN
  Administered 2018-02-01: 60 mg via INTRAVENOUS

## 2018-02-01 MED ORDER — PROPOFOL 10 MG/ML IV BOLUS
INTRAVENOUS | Status: DC | PRN
  Administered 2018-02-01: 110 mg via INTRAVENOUS

## 2018-02-01 MED ORDER — OXYCODONE HCL 5 MG/5ML PO SOLN: 5.0000 mg | Freq: Once | ORAL | Status: AC | PRN

## 2018-02-01 MED ORDER — PROPOFOL 1000 MG/100ML IV EMUL
INTRAVENOUS | Status: AC
  Filled 2018-02-01: qty 100

## 2018-02-01 MED ORDER — FENTANYL CITRATE (PF) 100 MCG/2ML IJ SOLN
INTRAMUSCULAR | Status: DC | PRN
  Administered 2018-02-01 (×2): 25 ug via INTRAVENOUS

## 2018-02-01 MED ORDER — OXYCODONE HCL 5 MG PO TABS
ORAL_TABLET | ORAL | Status: AC
  Filled 2018-02-01: qty 1

## 2018-02-01 MED ORDER — FENTANYL CITRATE (PF) 100 MCG/2ML IJ SOLN: 25.0000 ug | INTRAMUSCULAR | Status: DC | PRN

## 2018-02-01 MED ORDER — ONDANSETRON HCL 4 MG/2ML IJ SOLN
INTRAMUSCULAR | Status: DC | PRN
  Administered 2018-02-01: 4 mg via INTRAVENOUS

## 2018-02-01 MED ORDER — OXYCODONE-ACETAMINOPHEN 5-325 MG PO TABS: 1.0000 | ORAL_TABLET | Freq: Four times a day (QID) | ORAL | 0 refills | Status: DC | PRN

## 2018-02-01 MED ORDER — MIDAZOLAM HCL 2 MG/2ML IJ SOLN
INTRAMUSCULAR | Status: DC | PRN
  Administered 2018-02-01: 1 mg via INTRAVENOUS

## 2018-02-01 MED ORDER — HEPARIN SODIUM (PORCINE) 1000 UNIT/ML IJ SOLN
INTRAMUSCULAR | Status: DC | PRN
  Administered 2018-02-01: 3000 [IU] via INTRAVENOUS

## 2018-02-01 MED ORDER — SODIUM CHLORIDE 0.9 % IV SOLN
INTRAVENOUS | Status: DC
  Administered 2018-02-01: 08:00:00 via INTRAVENOUS

## 2018-02-01 MED ORDER — ONDANSETRON HCL 4 MG/2ML IJ SOLN: 4.0000 mg | Freq: Once | INTRAMUSCULAR | Status: DC | PRN

## 2018-02-01 MED ORDER — FENTANYL CITRATE (PF) 100 MCG/2ML IJ SOLN
25.0000 ug | INTRAMUSCULAR | Status: DC | PRN
  Administered 2018-02-01: 25 ug via INTRAVENOUS

## 2018-02-01 MED ORDER — PROPOFOL 10 MG/ML IV BOLUS
INTRAVENOUS | Status: AC
  Filled 2018-02-01: qty 20

## 2018-02-01 MED ORDER — LIDOCAINE-EPINEPHRINE (PF) 1 %-1:200000 IJ SOLN
INTRAMUSCULAR | Status: AC
  Filled 2018-02-01: qty 30

## 2018-02-01 MED ORDER — CEFAZOLIN SODIUM-DEXTROSE 2-4 GM/100ML-% IV SOLN
2.0000 g | INTRAVENOUS | Status: AC
  Administered 2018-02-01: 2 g via INTRAVENOUS
  Filled 2018-02-01: qty 100

## 2018-02-01 MED ORDER — FENTANYL CITRATE (PF) 250 MCG/5ML IJ SOLN
INTRAMUSCULAR | Status: AC
Start: 2018-02-01 — End: ?
  Filled 2018-02-01: qty 5

## 2018-02-01 MED ORDER — FENTANYL CITRATE (PF) 100 MCG/2ML IJ SOLN
INTRAMUSCULAR | Status: AC
  Filled 2018-02-01: qty 2

## 2018-02-01 SURGICAL SUPPLY — 42 items
ARMBAND PINK RESTRICT EXTREMIT (MISCELLANEOUS) ×3 IMPLANT
CANISTER SUCT 3000ML PPV (MISCELLANEOUS) ×3 IMPLANT
CLIP VESOCCLUDE MED 24/CT (CLIP) ×3 IMPLANT
CLIP VESOCCLUDE SM WIDE 24/CT (CLIP) ×3 IMPLANT
COVER PROBE W GEL 5X96 (DRAPES) ×3 IMPLANT
DECANTER SPIKE VIAL GLASS SM (MISCELLANEOUS) ×3 IMPLANT
DERMABOND ADVANCED (GAUZE/BANDAGES/DRESSINGS) ×2
DERMABOND ADVANCED .7 DNX12 (GAUZE/BANDAGES/DRESSINGS) ×1 IMPLANT
ELECT REM PT RETURN 9FT ADLT (ELECTROSURGICAL) ×3
ELECTRODE REM PT RTRN 9FT ADLT (ELECTROSURGICAL) ×1 IMPLANT
GLOVE BIO SURGEON STRL SZ7.5 (GLOVE) ×3 IMPLANT
GLOVE BIOGEL PI IND STRL 7.0 (GLOVE) ×1 IMPLANT
GLOVE BIOGEL PI IND STRL 8 (GLOVE) ×1 IMPLANT
GLOVE BIOGEL PI INDICATOR 7.0 (GLOVE) ×2
GLOVE BIOGEL PI INDICATOR 8 (GLOVE) ×2
GLOVE INDICATOR 7.0 STRL GRN (GLOVE) ×3 IMPLANT
GLOVE SURG SS PI 6.5 STRL IVOR (GLOVE) ×3 IMPLANT
GOWN STRL REUS W/ TWL LRG LVL3 (GOWN DISPOSABLE) ×2 IMPLANT
GOWN STRL REUS W/ TWL XL LVL3 (GOWN DISPOSABLE) ×2 IMPLANT
GOWN STRL REUS W/TWL LRG LVL3 (GOWN DISPOSABLE) ×4
GOWN STRL REUS W/TWL XL LVL3 (GOWN DISPOSABLE) ×4
HEMOSTAT SPONGE AVITENE ULTRA (HEMOSTASIS) IMPLANT
HEMOSTAT SURGICEL 2X14 (HEMOSTASIS) ×3 IMPLANT
KIT BASIN OR (CUSTOM PROCEDURE TRAY) ×3 IMPLANT
KIT TURNOVER KIT B (KITS) ×3 IMPLANT
NEEDLE HYPO 25GX1X1/2 BEV (NEEDLE) ×3 IMPLANT
NS IRRIG 1000ML POUR BTL (IV SOLUTION) ×3 IMPLANT
PACK CV ACCESS (CUSTOM PROCEDURE TRAY) ×3 IMPLANT
PAD ARMBOARD 7.5X6 YLW CONV (MISCELLANEOUS) ×9 IMPLANT
SUT MNCRL AB 4-0 PS2 18 (SUTURE) ×6 IMPLANT
SUT PROLENE 6 0 BV (SUTURE) ×9 IMPLANT
SUT PROLENE 7 0 BV 1 (SUTURE) IMPLANT
SUT SILK 2 0 SH (SUTURE) ×3 IMPLANT
SUT SILK 3 0 (SUTURE) ×2
SUT SILK 3-0 18XBRD TIE 12 (SUTURE) ×1 IMPLANT
SUT VIC AB 2-0 CT1 27 (SUTURE) ×2
SUT VIC AB 2-0 CT1 TAPERPNT 27 (SUTURE) ×1 IMPLANT
SUT VIC AB 3-0 SH 27 (SUTURE) ×4
SUT VIC AB 3-0 SH 27X BRD (SUTURE) ×2 IMPLANT
TOWEL GREEN STERILE (TOWEL DISPOSABLE) ×3 IMPLANT
UNDERPAD 30X30 (UNDERPADS AND DIAPERS) ×3 IMPLANT
WATER STERILE IRR 1000ML POUR (IV SOLUTION) ×3 IMPLANT

## 2018-02-01 NOTE — Anesthesia Postprocedure Evaluation (Signed)
Anesthesia Post Note  Patient: Brandy Houston  Procedure(s) Performed: SECOND STAGE BASILIC VEIN TRANSPOSITION RIGHT UPPER EXTREMITY (Right Arm Upper)     Patient location during evaluation: PACU Anesthesia Type: General Level of consciousness: awake and alert Pain management: pain level controlled Vital Signs Assessment: post-procedure vital signs reviewed and stable Respiratory status: spontaneous breathing, nonlabored ventilation and respiratory function stable Cardiovascular status: blood pressure returned to baseline and stable Postop Assessment: no apparent nausea or vomiting Anesthetic complications: no    Last Vitals:  Vitals:   02/01/18 1253 02/01/18 1305  BP: (!) 152/69 (!) 150/71  Pulse: 60 62  Resp: 17 16  Temp:    SpO2: 97% 93%    Last Pain:  Vitals:   02/01/18 1230  PainSc: [name]

## 2018-02-01 NOTE — Anesthesia Procedure Notes (Signed)
Procedure Name: LMA Insertion Date/Time: 02/01/2018 9:43 AM Performed by: Barrington Ellison, CRNA Pre-anesthesia Checklist: Patient identified, Emergency Drugs available, Suction available and Patient being monitored Patient Re-evaluated:Patient Re-evaluated prior to induction Oxygen Delivery Method: Circle System Utilized Preoxygenation: Pre-oxygenation with 100% oxygen Induction Type: IV induction Ventilation: Mask ventilation without difficulty LMA: LMA inserted LMA Size: 4.0 Number of attempts: 1 Placement Confirmation: positive ETCO2 Tube secured with: Tape Dental Injury: Teeth and Oropharynx as per pre-operative assessment

## 2018-02-01 NOTE — Telephone Encounter (Signed)
resch appt spk to pt 03/10/18 3pm p/o PA

## 2018-02-01 NOTE — Telephone Encounter (Signed)
Pharm directions state pt is taking 300 mg tid. Is this what you want to refill?

## 2018-02-01 NOTE — H&P (Signed)
History and Physical Interval Note:  02/01/2018 9:21 AM  Brandy Houston  has presented today for surgery, with the diagnosis of CHRONIC KIDNEY DISEASE FOR HEMODIALYSIS ACCESS  The various methods of treatment have been discussed with the patient and family. After consideration of risks, benefits and other options for treatment, the patient has consented to  Procedure(s): SECOND STAGE BASILIC VEIN TRANSPOSITION RIGHT UPPER EXTREMITY (Right) as a surgical intervention .  [name] history has been reviewed, patient examined, no change in status, stable for surgery.  I have reviewed the patient's chart and labs.  Questions were answered to the patient's satisfaction.     2nd stage right brachiobasilic fistula   Brandy Houston     Postoperative Access Visit   History of Present Illness   Brandy Houston is a 62 y.o. year old female who presents for postoperative follow-up for: right first stage brachial vein transposition (Date: 10/26/17).  [name] wounds are healed.  The patient notes no steal symptoms.  The patient is able to complete their activities of daily living.  [name] current symptoms are: none.      Past Medical History:  Diagnosis Date  . Anemia   . Anxiety   . Arthritis    "in my joints" (03/10/2017)  . Chronic diastolic CHF (congestive heart failure) ([place])   . CKD (chronic kidney disease), stage IV ([place])    stage IV. previous HD, none currently 10/30/15 (confirmed 02/05/2017 & 03/10/2017)  . Depression    Chronic  . Diabetic peripheral neuropathy ([place])   . Dysrhythmia    tachycardia, normal ECHO 08-09-14  . GERD (gastroesophageal reflux disease)   . Heart murmur     dx'd 02/05/2017  . Hepatitis C    "tx'd in 2016; I'm negative now" (02/05/2017)  . History of blood transfusion 2017   "w/knee replacement"  . Hypertension   . Osteoarthritis of left knee   . Peripheral neuropathy   . Protein calorie malnutrition ([place])   .  Septic arthritis ([place])   . Slow transit constipation   . Type II diabetes mellitus ([name])    IDDM  . Uncontrolled hypertension 02/18/2013  . Unsteady gait          Past Surgical History:  Procedure Laterality Date  . AMPUTATION Left 03/20/2017   Procedure: LEFT ABOVE KNEE AMPUTATION;  Surgeon: Newt Minion, MD;  Location: [facility];  Service: Orthopedics;  Laterality: Left;  . APPENDECTOMY    . AV FISTULA PLACEMENT Left 09/13/2014   Procedure: Brachial Artery to Brachial Vein Gortex Four - Seven Stretch GRAFT INSERTION Left Forearm;  Surgeon: Mal Misty, MD;  Location: [facility];  Service: Vascular;  Laterality: Left;  . BASCILIC VEIN TRANSPOSITION Right 10/26/2017   Procedure: BASILIC VEIN TRANSPOSITION FIRST STAGE RIGHT ARM;  Surgeon: Conrad Mullinville, MD;  Location: [facility];  Service: Vascular;  Laterality: Right;  . CHOLECYSTECTOMY OPEN    . COLON SURGERY    . EXCISIONAL TOTAL KNEE ARTHROPLASTY WITH ANTIBIOTIC SPACERS Left 02/06/2017   Procedure: Incisional total iknee with antibiotic spacer ;  Surgeon: Leandrew Koyanagi, MD;  Location: [facility];  Service: Orthopedics;  Laterality: Left;  . IR FLUORO GUIDE CV LINE RIGHT  02/10/2017  . IR REMOVAL TUN CV CATH W/O FL  04/01/2017  . IR US GUIDE VASC ACCESS RIGHT  02/10/2017  . IRRIGATION AND DEBRIDEMENT KNEE Left 03/12/2017   Procedure: IRRIGATION AND DEBRIDEMENT LEFT KNEE WITH WOUND VAC APPLICATION;  Surgeon:  Leandrew Koyanagi, MD;  Location: [facility];  Service: Orthopedics;  Laterality: Left;  . JOINT REPLACEMENT    . KNEE ARTHROSCOPY Right 08/10/2014   Procedure: ARTHROSCOPY I & D KNEE;  Surgeon: Marianna Payment, MD;  Location: [facility];  Service: Orthopedics;  Laterality: Right;  . KNEE ARTHROSCOPY Left 08/11/2014   Procedure: ARTHROSCOPIC WASHOUT LEFT KNEE;  Surgeon: Marianna Payment, MD;  Location: [facility];  Service: Orthopedics;  Laterality: Left;  . KNEE ARTHROSCOPY Left 08/19/2014   Procedure: ARTHROSCOPIC WASHOUT LEFT KNEE;   Surgeon: Leandrew Koyanagi, MD;  Location: [facility];  Service: Orthopedics;  Laterality: Left;  . KNEE ARTHROSCOPY WITH LATERAL MENISECTOMY Left 04/04/2015   Procedure: AND PARTIAL LATERAL MENISECTOMY;  Surgeon: Leandrew Koyanagi, MD;  Location: [place] [facility];  Service: Orthopedics;  Laterality: Left;  . KNEE ARTHROSCOPY WITH MEDIAL MENISECTOMY Left 04/04/2015   Procedure: LEFT KNEE ARTHROSCOPY WITH PARTIAL MEDIAL MENISCECTOMY  AND SYNOVECTOMY;  Surgeon: Leandrew Koyanagi, MD;  Location: [facility];  Service: Orthopedics;  Laterality: Left;  . SHOULDER ARTHROSCOPY Bilateral 08/10/2014   Procedure: I & D BILATERAL SHOULDERS ;  Surgeon: Marianna Payment, MD;  Location: [facility];  Service: Orthopedics;  Laterality: Bilateral;  . SMALL INTESTINE SURGERY     Due to Small Bowel Obstruction; "fixed it when they did my gallbladder OR"  . TEE WITHOUT CARDIOVERSION N/A 08/14/2014   Procedure: TRANSESOPHAGEAL ECHOCARDIOGRAM (TEE);  Surgeon: Thayer Headings, MD;  Location: [facility];  Service: Cardiovascular;  Laterality: N/A;  . TENOSYNOVECTOMY Right 08/11/2014   Procedure: RIGHT WRIST IRRIGATION AND DEBRIDEMENT, TENOSYNOVECTOMY;  Surgeon: Marianna Payment, MD;  Location: [facility];  Service: Orthopedics;  Laterality: Right;  . TOTAL KNEE ARTHROPLASTY Left 11/07/2015   Procedure: LEFT TOTAL KNEE ARTHROPLASTY WITH REVISION OF IMPLANTS;  Surgeon: Leandrew Koyanagi, MD;  Location: [facility];  Service: Orthopedics;  Laterality: Left;  . TUBAL LIGATION      Social History        Socioeconomic History  . Marital status: Single    Spouse name: Not on file  . Number of children: Not on file  . Years of education: Not on file  . Highest education level: Not on file  Occupational History  . Not on file  Social Needs  . Financial resource strain: Not on file  . Food insecurity:    Worry: Not on file    Inability: Not on file  . Transportation needs:    Medical: Not on file     Non-medical: Not on file  Tobacco Use  . Smoking status: Current Every Day Smoker    Packs/day: 0.50    Years: 35.00    Pack years: 17.50    Types: Cigarettes  . Smokeless tobacco: Never Used  Substance and Sexual Activity  . Alcohol use: No    Alcohol/week: 0.0 oz    Comment: 03/10/2017 "I drink a wine cooler a few times/year"  . Drug use: No  . Sexual activity: Never  Lifestyle  . Physical activity:    Days per week: Not on file    Minutes per session: Not on file  . Stress: Not on file  Relationships  . Social connections:    Talks on phone: Not on file    Gets together: Not on file    Attends religious service: Not on file    Active member of club or organization: Not on file    Attends meetings of clubs  or organizations: Not on file    Relationship status: Not on file  . Intimate partner violence:    Fear of current or ex partner: Not on file    Emotionally abused: Not on file    Physically abused: Not on file    Forced sexual activity: Not on file  Other Topics Concern  . Not on file  Social History Narrative  . Not on file         Family History  Problem Relation Age of Onset  . Cancer Mother   . Heart disease Father   . Cancer Sister   . Hypertension Sister   . Cancer Brother   . Diabetes Brother   . Hypertension Brother           Current Outpatient Medications  Medication Sig Dispense Refill  . amLODipine (NORVASC) 10 MG tablet Take 1 tablet (10 mg total) by mouth at bedtime. 30 tablet 0  . aspirin EC 81 MG tablet Take 81 mg by mouth daily.    [name] atorvastatin (LIPITOR) 10 MG tablet Take 1 tablet (10 mg total) by mouth at bedtime. 90 tablet 3  . calcitRIOL (ROCALTROL) 0.25 MCG capsule Take 1 capsule (0.25 mcg total) by mouth every Monday, Wednesday, and Friday. 90 capsule 0  . calcium carbonate (TUMS EX) 750 MG chewable tablet Chew 1 tablet by mouth 3 (three) times daily as needed for heartburn.     .  cloNIDine (CATAPRES) 0.2 MG tablet Take 0.2 mg by mouth 2 (two) times daily.    . ferrous sulfate 325 (65 FE) MG tablet Take 1 tablet (325 mg total) by mouth 2 (two) times daily with a meal. 60 tablet 3  . gabapentin (NEURONTIN) 300 MG capsule TAKE 1 CAPSULE BY MOUTH THREE TIMES DAILY 90 capsule 0  . hydrALAZINE (APRESOLINE) 25 MG tablet Take 4 tablets (100 mg total) by mouth 2 (two) times daily.    . Insulin Glargine (LANTUS SOLOSTAR) 100 UNIT/ML Solostar Pen Inject 12 Units into the skin daily at 10 pm. 5 pen PRN  . methocarbamol (ROBAXIN) 500 MG tablet TAKE 1 TABLET BY MOUTH EVERY 6 HOURS AS NEEDED FOR MUSCLE SPASM 30 tablet 2  . metolazone (ZAROXOLYN) 2.5 MG tablet Take 2.5 mg by mouth daily.  5  . metoprolol succinate (TOPROL-XL) 50 MG 24 hr tablet Take 1 tablet (50 mg total) by mouth 2 (two) times daily. 60 tablet 0  . Multiple Vitamin (MULTIVITAMIN WITH MINERALS) TABS tablet Take 1 tablet by mouth daily.    [name] omeprazole (PRILOSEC) 20 MG capsule Take 20 mg by mouth daily before breakfast.     . polyethylene glycol (MIRALAX / GLYCOLAX) packet Take 17 g by mouth 2 (two) times daily. 14 each 0  . sertraline (ZOLOFT) 100 MG tablet Take 1 tablet (100 mg total) by mouth daily. 30 tablet 6  . sodium bicarbonate 650 MG tablet Take 1,300 mg by mouth 3 (three) times daily.   3  . Blood Glucose Monitoring Suppl (ACCU-CHEK AVIVA PLUS) W/DEVICE KIT Check sugars TID for E11.65 (Patient not taking: Reported on 10/20/2017) 1 kit 0  . furosemide (LASIX) 80 MG tablet Take 1 tablet (80 mg total) by mouth 2 (two) times daily. 60 tablet 0  . glucose blood (ACCU-CHEK AVIVA) test strip Check sugars TID for E11.65 (Patient not taking: Reported on 10/20/2017) 100 each 12  . Lancet Devices (ACCU-CHEK SOFTCLIX) lancets Check sugars TID for E11.65 (Patient not taking: Reported on 10/20/2017) 1 each 10  .  oxyCODONE (ROXICODONE) 5 MG immediate release tablet Take 1 tablet (5 mg total) by mouth every 4 (four) hours as  needed for severe pain. (Patient not taking: Reported on 11/25/2017) 10 tablet 0   No current facility-administered medications for this visit.           Allergies  Allergen Reactions  . Compazine [Prochlorperazine] Shortness Of Breath, Swelling and Other (See Comments)    TONGUE SWELLS  . Shellfish-Derived Products Anaphylaxis  . Iodinated Diagnostic Agents Hives and Rash  . Omnipaque [Iohexol] Hives  . Sulfa Antibiotics Rash    REVIEW OF SYSTEMS (negative unless checked):   Cardiac:  '[]'  Chest pain or chest pressure? '[]'  Shortness of breath upon activity? '[]'  Shortness of breath when lying flat? '[]'  Irregular heart rhythm?  Vascular:  '[]'  Pain in calf, thigh, or hip brought on by walking? '[]'  Pain in feet at night that wakes you up from your sleep? '[]'  Blood clot in your veins? '[]'  Leg swelling?  Pulmonary:  '[]'  Oxygen at home? '[]'  Productive cough? '[]'  Wheezing?  Neurologic:  '[]'  Sudden weakness in arms or legs? '[]'  Sudden numbness in arms or legs? '[]'  Sudden onset of difficult speaking or slurred speech? '[]'  Temporary loss of vision in one eye? '[]'  Problems with dizziness?  Gastrointestinal:  '[]'  Blood in stool? '[]'  Vomited blood?  Genitourinary:  '[]'  Burning when urinating? '[]'  Blood in urine? '[x]'   CKD Stage V  Psychiatric:  '[]'  Major depression  Hematologic:  '[]'  Bleeding problems? '[]'  Problems with blood clotting?  Dermatologic:  '[]'  Rashes or ulcers?  Constitutional:  '[]'  Fever or chills?  Ear/Nose/Throat:  '[]'  Change in hearing? '[]'  Nose bleeds? '[]'  Sore throat?  Musculoskeletal:  '[]'  Back pain? '[]'  Joint pain? '[]'  Muscle pain?   Physical Examination       Vitals:   01/01/18 0914 01/01/18 0916  BP: (!) 169/76 (!) 171/76  Pulse: 66   Resp: 18   SpO2: 94%   Weight: 147 lb (66.7 kg)   Height: '5\' 7"'  (1.702 m)      right arm Incision is healed, skin feels warm, hand grip is 5/5, sensation in digits is intact, palpable thrill,  bruit can be auscultated, On Sonosite: 5.5-6.5 mm     Medical Decision Making   Brandy Houston is a 62 y.o. year old female who presents s/p right first stage brachial vein transposition   [name] access is ready for use.  Patient is going on vacation, so earliest the R 2nd stage BVT can be done is 19 AUG 19.  Risk, benefits, and alternatives to access surgery were discussed.    The patient is aware the risks include but are not limited to: bleeding, infection, steal syndrome, nerve damage, ischemic monomelic neuropathy, thrombosis, failure to mature, complications related to venous hypertension, need for additional procedures, death and stroke.    The patient agrees to proceed forward with the procedure on 19 AUG 19.  I have discussed with the patient that one of my partners will have to complete the procedure as I will have transitioned to another practice by then.  Thank you for allowing [place] to participate in this patient's care.   Adele Barthel, MD, FACS [facility] Office: [phone] Pager: [phone]

## 2018-02-01 NOTE — Discharge Instructions (Signed)
° °  [facility] ° °Discharge Instructions ° °AV Fistula or Graft Surgery for Dialysis Access ° °Please refer to the following instructions for your post-procedure care. Your surgeon or physician assistant will discuss any changes with you. ° °Activity ° °You may drive the day following your surgery, if you are comfortable and no longer taking prescription pain medication. Resume full activity as the soreness in your incision resolves. ° °Bathing/Showering ° °You may shower after you go home. Keep your incision dry for 48 hours. Do not soak in a bathtub, hot tub, or swim until the incision heals completely. You may not shower if you have a hemodialysis catheter. ° °Incision Care ° °Clean your incision with mild soap and water after 48 hours. Pat the area dry with a clean towel. You do not need a bandage unless otherwise instructed. Do not apply any ointments or creams to your incision. You may have skin glue on your incision. Do not peel it off. It will come off on its own in about one week. Your arm may swell a bit after surgery. To reduce swelling use pillows to elevate your arm so it is above your heart. Your doctor will tell you if you need to lightly wrap your arm with an ACE bandage. ° °Diet ° °Resume your normal diet. There are not special food restrictions following this procedure. In order to heal from your surgery, it is CRITICAL to get adequate nutrition. Your body requires vitamins, minerals, and protein. Vegetables are the best source of vitamins and minerals. Vegetables also provide the perfect balance of protein. Processed food has little nutritional value, so try to avoid this. ° °Medications ° °Resume taking all of your medications. If your incision is causing pain, you may take over-the counter pain relievers such as acetaminophen (Tylenol). If you were prescribed a stronger pain medication, please be aware these medications can cause nausea and constipation. Prevent  nausea by taking the medication with a snack or meal. Avoid constipation by drinking plenty of fluids and eating foods with high amount of fiber, such as fruits, vegetables, and grains. Do not take Tylenol if you are taking prescription pain medications. ° ° ° ° °Follow up °Your surgeon may want to see you in the office following your access surgery. If so, this will be arranged at the time of your surgery. ° °Please call us immediately for any of the following conditions: ° °Increased pain, redness, drainage (pus) from your incision site °Fever of 101 degrees or higher °Severe or worsening pain at your incision site °Hand pain or numbness. ° °Reduce your risk of vascular disease: ° °Stop smoking. If you would like help, call [facility] at 1-800-QUIT-NOW (1-800-784-8669) or [facility] at 336-586-4000 ° °Manage your cholesterol °Maintain a desired weight °Control your diabetes °Keep your blood pressure down ° °Dialysis ° °It will take several weeks to several months for your new dialysis access to be ready for use. Your surgeon will determine when it is OK to use it. Your nephrologist will continue to direct your dialysis. You can continue to use your Permcath until your new access is ready for use. ° °If you have any questions, please call the office at [phone]. ° °

## 2018-02-01 NOTE — Transfer of Care (Signed)
Immediate Anesthesia Transfer of Care Note  Patient: Brandy Houston  Procedure(s) Performed: SECOND STAGE BASILIC VEIN TRANSPOSITION RIGHT UPPER EXTREMITY (Right Arm Upper)  Patient Location: PACU  Anesthesia Type:General  Level of Consciousness: awake, alert  and oriented  Airway & Oxygen Therapy: Patient Spontanous Breathing  Post-op Assessment: Report given to RN  Post vital signs: Reviewed and stable  Last Vitals:  Vitals Value Taken Time  BP    Temp    Pulse 61 02/01/2018 11:55 AM  Resp 13 02/01/2018 11:55 AM  SpO2 96 % 02/01/2018 11:55 AM  Vitals shown include unvalidated device data.  Last Pain:  Vitals:   02/01/18 0739  PainSc: 0-No pain      Patients Stated Pain Goal: 5 (88/75/79 [id number])  Complications: No apparent anesthesia complications

## 2018-02-01 NOTE — Op Note (Signed)
    OPERATIVE NOTE   PROCEDURE: Right second stage basilic vein transposition (brachiobasilic arteriovenous fistula) placement  PRE-OPERATIVE DIAGNOSIS: CKD stage IV - V  POST-OPERATIVE DIAGNOSIS: same  SURGEON: Marty Heck, MD  ASSISTANT(S): Arlee Muslim, PA  ANESTHESIA: LMA  ESTIMATED BLOOD LOSS: <50 cc  FINDING(S): Palpable thrill in basilic vein prior to second stage transposition.  Nice vein that was dilated >6 mm.  Palpable thrill after transposition with palpable radial pulse at the wrist.  SPECIMEN(S):  None  INDICATIONS:   Brandy Houston is a 62 y.o. female with CKD stage IV - V and has undergone a first stage brachiobasilic fistula. The patient is scheduled for right second stage basilic vein transposition.  The patient is aware the risks include but are not limited to: bleeding, infection, steal syndrome, nerve damage, ischemic monomelic neuropathy, failure to mature, and need for additional procedures.  The patient is aware of the risks of the procedure and elects to proceed forward.   DESCRIPTION: After full informed written consent was obtained from the patient, the patient was brought back to the operating room and placed supine upon the operating table.  Prior to induction, the patient received IV antibiotics.   After obtaining adequate anesthesia, the patient was then prepped and draped in the standard fashion for a right arm access procedure.  I turned my attention first to identifying the patient's brachiobasilic arteriovenous fistula.  Using SonoSite guidance, the anastomosis and basilic vein were marked out on the skin.    This was an excellent caliber vein.  I made 2 longitudinal incisions on the medial aspect of the right upper arm.  Through these incisions I dissected out circumferentially the basilic vein, taking care to protect the nerve.  Once the vein was fully mobilized, all side branches were ligated between silk ties and small clips.  The vein was  marked for orientation.  I then used a curved tunneler to create a subcutaneous tunnel.  The patient was given 3000 units of IV heparin prior to transecting the vein.  The vein was then transected near the antecubital crease.  It was then brought to the previously created tunnel making sure to maintain proper orientation.  A primary anastomosis was then performed between the two cut ends of the vein after it was spatulated using two running 6-0 Prolene sutures.  Once this was done the clamps were released.  There was excellent flow through the fistula.  Hemostasis was then achieved.  The wound was irrigated.  The incision was closed with a deep layer of 3-0 Vicryl followed by a subcutaneous 4-0 Monocryl and Dermabond.  There were no immediate complications.  COMPLICATIONS: None  CONDITION: Stable  Marty Heck, MD [facility] Office: [phone] Pager: [phone]  02/01/2018, 11:43 AM

## 2018-02-01 NOTE — Anesthesia Preprocedure Evaluation (Addendum)
Anesthesia Evaluation  Patient identified by MRN, date of birth, ID band Patient awake    Reviewed: Allergy & Precautions, NPO status , Patient's Chart, lab work & pertinent test results  History of Anesthesia Complications Negative for: history of anesthetic complications  Airway Mallampati: II  TM Distance: >3 FB Neck ROM: Full    Dental  (+) Edentulous Upper, Edentulous Lower   Pulmonary Current Smoker,    breath sounds clear to auscultation       Cardiovascular hypertension, Pt. on medications and Pt. on home beta blockers +CHF  + Valvular Problems/Murmurs AI  Rhythm:Regular Rate:Normal + Systolic murmurs  '16 TEE - EF 55% to 60%. Moderate AI (deemed mild by TTE few days prior to TEE).     Neuro/Psych Anxiety Depression negative neurological ROS     GI/Hepatic GERD  Medicated,(+) Hepatitis -, C  Endo/Other  diabetes, Type 2, Insulin Dependent  Renal/GU ESRFRenal disease  negative genitourinary   Musculoskeletal  (+) Arthritis ,   Abdominal   Peds  Hematology  (+) anemia ,   Anesthesia Other Findings   Reproductive/Obstetrics                           Anesthesia Physical Anesthesia Plan  ASA: IV  Anesthesia Plan: MAC   Post-op Pain Management:    Induction: Intravenous  PONV Risk Score and Plan: 2 and Propofol infusion and Treatment may vary due to age or medical condition  Airway Management Planned: Nasal Cannula and Natural Airway  Additional Equipment: None  Intra-op Plan:   Post-operative Plan:   Informed Consent: I have reviewed the patients History and Physical, chart, labs and discussed the procedure including the risks, benefits and alternatives for the proposed anesthesia with the patient or authorized representative who has indicated his/her understanding and acceptance.     Plan Discussed with: CRNA and Anesthesiologist  Anesthesia Plan Comments:         Anesthesia Quick Evaluation

## 2018-02-02 ENCOUNTER — Encounter (HOSPITAL_COMMUNITY): Payer: Self-pay | Admitting: Vascular Surgery

## 2018-02-02 ENCOUNTER — Ambulatory Visit: Payer: Medicare Other | Admitting: Physical Therapy

## 2018-02-04 ENCOUNTER — Encounter: Payer: Medicare Other | Admitting: Physical Therapy

## 2018-02-09 ENCOUNTER — Ambulatory Visit: Payer: Medicare Other | Admitting: Physical Therapy

## 2018-02-11 ENCOUNTER — Ambulatory Visit: Payer: Medicare Other | Admitting: Physical Therapy

## 2018-02-11 ENCOUNTER — Encounter: Payer: Self-pay | Admitting: Physical Therapy

## 2018-02-11 DIAGNOSIS — M6281 Muscle weakness (generalized): Secondary | ICD-10-CM

## 2018-02-11 DIAGNOSIS — R2689 Other abnormalities of gait and mobility: Secondary | ICD-10-CM

## 2018-02-11 DIAGNOSIS — R2681 Unsteadiness on feet: Secondary | ICD-10-CM | POA: Diagnosis not present

## 2018-02-11 DIAGNOSIS — R293 Abnormal posture: Secondary | ICD-10-CM | POA: Diagnosis not present

## 2018-02-15 NOTE — Therapy (Signed)
[facility] [address] [place], [place], [address] Phone: [phone]   Fax:  [phone]  Physical Therapy Treatment  Patient Details  Name: Brandy Houston MRN: [id number] Date of Birth: 1955-07-09 Referring Provider: Meridee Score, MD   Encounter Date: 02/11/2018     02/11/18 1104  PT Visits / Re-Eval  Visit Number 26  Number of Visits 42  Date for PT Re-Evaluation 04/02/18  Authorization  Authorization Type Medicare & Medicaid  PT Time Calculation  PT Start Time 1020  PT Stop Time 1100  PT Time Calculation (min) 40 min  PT - End of Session  Equipment Utilized During Treatment Gait belt  Activity Tolerance Patient tolerated treatment well;Patient limited by fatigue  Behavior During Therapy [facility] for tasks assessed/performed    Past Medical History:  Diagnosis Date  . Anemia   . Anxiety   . Arthritis    "in my joints" (03/10/2017)  . Chronic diastolic CHF (congestive heart failure) ([place])   . CKD (chronic kidney disease), stage IV ([place])    stage IV. previous HD, none currently 10/30/15 (confirmed 02/05/2017 & 03/10/2017)  . Depression    Chronic  . Diabetic peripheral neuropathy ([place])   . Dysrhythmia    tachycardia, normal ECHO 08-09-14  . GERD (gastroesophageal reflux disease)   . Heart murmur     dx'd 02/05/2017  . Hepatitis C    "tx'd in 2016; I'm negative now" (02/05/2017)  . History of blood transfusion 2017   "w/knee replacement"  . Hypertension   . Osteoarthritis of left knee   . Peripheral neuropathy   . Protein calorie malnutrition ([place])   . Septic arthritis ([name])   . Slow transit constipation   . Type II diabetes mellitus ([name])    IDDM  . Uncontrolled hypertension 02/18/2013  . Unsteady gait     Past Surgical History:  Procedure Laterality Date  . AMPUTATION Left 03/20/2017   Procedure: LEFT ABOVE KNEE AMPUTATION;  Surgeon: Newt Minion, MD;  Location: [facility];  Service: Orthopedics;   Laterality: Left;  . APPENDECTOMY    . AV FISTULA PLACEMENT Left 09/13/2014   Procedure: Brachial Artery to Brachial Vein Gortex Four - Seven Stretch GRAFT INSERTION Left Forearm;  Surgeon: Mal Misty, MD;  Location: [facility];  Service: Vascular;  Laterality: Left;  . BASCILIC VEIN TRANSPOSITION Right 10/26/2017   Procedure: BASILIC VEIN TRANSPOSITION FIRST STAGE RIGHT ARM;  Surgeon: Conrad Worthington, MD;  Location: [facility];  Service: Vascular;  Laterality: Right;  . BASCILIC VEIN TRANSPOSITION Right 02/01/2018   Procedure: SECOND STAGE BASILIC VEIN TRANSPOSITION RIGHT UPPER EXTREMITY;  Surgeon: Marty Heck, MD;  Location: [facility];  Service: Vascular;  Laterality: Right;  . CHOLECYSTECTOMY OPEN    . COLON SURGERY    . EXCISIONAL TOTAL KNEE ARTHROPLASTY WITH ANTIBIOTIC SPACERS Left 02/06/2017   Procedure: Incisional total iknee with antibiotic spacer ;  Surgeon: Leandrew Koyanagi, MD;  Location: [facility];  Service: Orthopedics;  Laterality: Left;  . IR FLUORO GUIDE CV LINE RIGHT  02/10/2017  . IR REMOVAL TUN CV CATH W/O FL  04/01/2017  . IR US GUIDE VASC ACCESS RIGHT  02/10/2017  . IRRIGATION AND DEBRIDEMENT KNEE Left 03/12/2017   Procedure: IRRIGATION AND DEBRIDEMENT LEFT KNEE WITH WOUND VAC APPLICATION;  Surgeon: Leandrew Koyanagi, MD;  Location: [facility];  Service: Orthopedics;  Laterality: Left;  . JOINT REPLACEMENT    . KNEE ARTHROSCOPY Right 08/10/2014   Procedure: ARTHROSCOPY  I & D KNEE;  Surgeon: Marianna Payment, MD;  Location: [facility];  Service: Orthopedics;  Laterality: Right;  . KNEE ARTHROSCOPY Left 08/11/2014   Procedure: ARTHROSCOPIC WASHOUT LEFT KNEE;  Surgeon: Marianna Payment, MD;  Location: [facility];  Service: Orthopedics;  Laterality: Left;  . KNEE ARTHROSCOPY Left 08/19/2014   Procedure: ARTHROSCOPIC WASHOUT LEFT KNEE;  Surgeon: Leandrew Koyanagi, MD;  Location: [facility];  Service: Orthopedics;  Laterality: Left;  . KNEE ARTHROSCOPY WITH LATERAL MENISECTOMY Left 04/04/2015   Procedure: AND PARTIAL  LATERAL MENISECTOMY;  Surgeon: Leandrew Koyanagi, MD;  Location: [facility];  Service: Orthopedics;  Laterality: Left;  . KNEE ARTHROSCOPY WITH MEDIAL MENISECTOMY Left 04/04/2015   Procedure: LEFT KNEE ARTHROSCOPY WITH PARTIAL MEDIAL MENISCECTOMY  AND SYNOVECTOMY;  Surgeon: Leandrew Koyanagi, MD;  Location: [facility];  Service: Orthopedics;  Laterality: Left;  . SHOULDER ARTHROSCOPY Bilateral 08/10/2014   Procedure: I & D BILATERAL SHOULDERS ;  Surgeon: Marianna Payment, MD;  Location: [facility];  Service: Orthopedics;  Laterality: Bilateral;  . SMALL INTESTINE SURGERY     Due to Small Bowel Obstruction; "fixed it when they did my gallbladder OR"  . TEE WITHOUT CARDIOVERSION N/A 08/14/2014   Procedure: TRANSESOPHAGEAL ECHOCARDIOGRAM (TEE);  Surgeon: Thayer Headings, MD;  Location: [facility];  Service: Cardiovascular;  Laterality: N/A;  . TENOSYNOVECTOMY Right 08/11/2014   Procedure: RIGHT WRIST IRRIGATION AND DEBRIDEMENT, TENOSYNOVECTOMY;  Surgeon: Marianna Payment, MD;  Location: [facility];  Service: Orthopedics;  Laterality: Right;  . TOTAL KNEE ARTHROPLASTY Left 11/07/2015   Procedure: LEFT TOTAL KNEE ARTHROPLASTY WITH REVISION OF IMPLANTS;  Surgeon: Leandrew Koyanagi, MD;  Location: [facility];  Service: Orthopedics;  Laterality: Left;  . TUBAL LIGATION      There were no vitals filed for this visit.    02/11/18 1020  Symptoms/Limitations  Subjective She has 2nd phase of vascular surgery for right arm access for dialysis. Port can not be used for 4 weeks. Doctors are monitoring kidney function closely and hoping not to have start dialysis until after that 4 weeks.   Pertinent History left TFA, DM, neuropathy, Hep C, HTN, CHF, depression, CKD st 5 not dialysis, arthritis, heart murmur,   Limitations Standing;Walking;House hold activities  Patient Stated Goals To walk with prosthesis in home & community, travel plane, car & cruise in September  Pain Assessment  Currently in Pain?  No/denies             02/11/18 2145  Transfers  Transfers Sit to Stand;Stand to Sit  Sit to Stand 5: Supervision;4: Min assist (MinA to cane)  Stand to Sit 5: Supervision;4: Min guard  Ambulation/Gait  Ambulation/Gait Assistance 5: Supervision;3: Mod assist ([place] with cane/HHA)  Ambulation/Gait Assistance Details PT adjusted patient's rollator walker seat & arm height to properly fit her;  supervision with rollator & prosthesis,   Ambulation Distance (Feet) 300 Feet (300' rollator & 61' with cane & handhold modA)  Assistive device Rollator;Lofstrands;Prosthesis;Straight cane;1 person hand held assist  Gait Pattern Step-through pattern;Decreased step length - right;Decreased step length - left;Decreased weight shift to left;Narrow base of support  Stairs Assistance 5: Supervision  Stair Management Technique One rail Left;Step to pattern;Forwards (lofstrand crutch)  Number of Stairs 4 (x 2)  Height of Stairs 6  Ramp 5: Supervision (rollator walker & TFA prosthesis)  Curb 5: Supervision (rollator walker & TFA prosthesis)  Self-Care  Lifting Patient able to pick up 3# weight from floor  with rollator walker support.  Neuro Re-ed   Neuro Re-ed Details  standing balance static without UE support with multiple attempts to stand 30 seconds with close supervision.   Prosthetics  Prosthetic Care Comments  In preparation for dialysis, PT instructed pt in dry weight, weighing in/out dialysis & need to subtract prosthesis weight from all weigh-in, weigh-out & not including prosthesis weight in dry weight.  Current prosthetic wear tolerance (days/week)  reports 5-6 days/wk  Current prosthetic wear tolerance (#hours/day)  reports 8 hrs/day  Education Provided Other (comment) (see prosthetic care comments)  Person(s) Educated Patient  Education Method Explanation;Verbal cues  Education Method Verbalized understanding;Verbal cues required;Needs further instruction                    02/11/18 2200  Plan  Clinical Impression Statement Patient met all STGs with rollator & Prosthesis. She appears to understand wearing prosthesis & subtracting prosthesis weight from weighing in & out when she starts dialysis.   Pt will benefit from skilled therapeutic intervention in order to improve on the following deficits Abnormal gait;Decreased activity tolerance;Decreased balance;Decreased endurance;Decreased knowledge of use of DME;Decreased mobility;Decreased strength;Decreased range of motion;Impaired flexibility;Prosthetic Dependency;Postural dysfunction  Rehab Potential Good  Clinical Impairments Affecting Rehab Potential transfemoral level amputation with multiple medical issues, significantly limited activity level for >2 years  PT Frequency 2x / week  PT Duration 12 weeks  PT Treatment/Interventions ADLs/Self Care Home Management;Moist Heat;DME Instruction;Gait training;Stair training;Functional mobility training;Therapeutic activities;Therapeutic exercise;Balance training;Neuromuscular re-education;Patient/family education;Prosthetic Training;Manual techniques;Vestibular  PT Next Visit Plan Prosthetic gait with forearm crutches using 3 point pattern.   Consulted and Agree with Plan of Care Patient         PT Short Term Goals - 02/11/18 2155      PT SHORT TERM GOAL #1   Title  Patient verbalizes initial understanding of need to adjust ply socks with limb volume changes. (All STGs Target Date: 02/05/2018)  (Pended)     Baseline  MET 02/11/2018  (Pended)     Time  1  (Pended)     Period  Months  (Pended)     Status  Achieved  (Pended)       PT SHORT TERM GOAL #2   Title  patient reports prosthesis wear >5 days /wk for >/= 8 hrs/day  (Pended)     Baseline  MET 02/11/2018  (Pended)     Time  1  (Pended)     Period  Months  (Pended)     Status  Achieved  (Pended)       PT SHORT TERM GOAL #3   Title  Patient picks up 3# objects from floor with RW support with  prosthesis with supervision.   (Pended)     Baseline  MET 02/11/2018  (Pended)     Time  1  (Pended)     Period  Months  (Pended)     Status  Achieved  (Pended)       PT SHORT TERM GOAL #4   Title  Patient ambulates 300' with rollator walker & prosthesis with supervision.   (Pended)     Baseline  MET 02/11/2018  (Pended)     Time  1  (Pended)     Period  Months  (Pended)     Status  Achieved  (Pended)       PT SHORT TERM GOAL #5   Title  Patient negotiates stairs with 1 rail/crutch, ramp & curb with Rollator walker & prosthesis  with supervision.   (Pended)     Baseline  MET 02/11/2018  (Pended)     Time  1  (Pended)     Period  Months  (Pended)     Status  Achieved  (Pended)         PT Short Term Goals - 12/31/17 1509      PT SHORT TERM GOAL #1   Title  Patient verbalizes initial understanding of need to adjust ply socks with limb volume changes. (All STGs Target Date: 02/05/2018)    Time  1    Period  Months    Status  New    Target Date  02/05/18      PT SHORT TERM GOAL #2   Title  patient reports prosthesis wear >5 days /wk for >/= 8 hrs/day    Time  1    Period  Months    Status  New    Target Date  02/05/18      PT SHORT TERM GOAL #3   Title  Patient picks up 3# objects from floor with RW support with prosthesis with supervision.     Time  1    Period  Months    Status  New    Target Date  02/05/18      PT SHORT TERM GOAL #4   Title  Patient ambulates 300' with rollator walker & prosthesis with supervision.     Time  1    Period  Months    Status  New    Target Date  02/05/18      PT SHORT TERM GOAL #5   Title  Patient negotiates stairs with 1 rail/crutch, ramp & curb with Rollator walker & prosthesis with supervision.     Time  1    Period  Months    Status  New    Target Date  02/05/18        PT Long Term Goals - 12/31/17 1518      PT LONG TERM GOAL #1   Title  Patient verbalizes & demonstrates proper prosthetic care to enable safe use of  prosthesis. (All LTGs Target Date: 04/01/2018)    Time  3    Period  Months    Status  New    Target Date  04/01/18      PT LONG TERM GOAL #2   Title  Patient tolerates & reports prosthesis wear daily >75% of awake hours without skin issues or limb pain.     Time  3    Period  Months    Status  New    Target Date  04/01/18      PT LONG TERM GOAL #3   Title  Patient ambulates 500' outdoors including grass with LRAD & prosthesis modified independent for community mobility.     Time  3    Period  Months    Status  On-going    Target Date  04/01/18      PT LONG TERM GOAL #4   Title  Patient negotiates ramps, curbs & stairs with LRAD & prosthesis modified independent to enable community mobility.     Time  3    Period  Months    Status  New    Target Date  04/01/18      PT LONG TERM GOAL #5   Title  Task of [place] Balance Test with walker support >45/56    Time  3    Period  Months  Target Date  04/01/18              Patient will benefit from skilled therapeutic intervention in order to improve the following deficits and impairments:     Visit Diagnosis: Other abnormalities of gait and mobility  Muscle weakness (generalized)  Abnormal posture  Unsteadiness on feet     Problem List Patient Active Problem List   Diagnosis Date Noted  . Benign essential HTN   . Stage 5 chronic kidney disease not on chronic dialysis ([place])   . Above knee amputation status, left ([place])   . DM type 2 causing CKD stage 5 ([place]) 03/21/2017  . Anemia of chronic disease 02/05/2017  . Dysplasia of cervix, high grade CIN 2 01/12/2017  . Chronic diastolic CHF (congestive heart failure) ([place]) 11/08/2015  . Total knee replacement status   . Diabetic peripheral neuropathy ([place])   . Major depression, recurrent, chronic ([place])   . Chronic kidney disease (CKD), stage IV (severe) ([place])   . Hepatitis C 08/16/2014  . Peripheral neuropathy 06/24/2013  . Compulsive tobacco user syndrome  06/24/2013    Jamey Reas PT, DPT 02/15/2018, 9:42 PM  [facility] [address] [place], [place], [address] Phone: [phone]   Fax:  [phone]  Name: Brandy [place] MRN: [id number] Date of Birth: 09/18/1955

## 2018-02-16 ENCOUNTER — Encounter: Payer: Medicare Other | Admitting: Physical Therapy

## 2018-02-18 ENCOUNTER — Encounter: Payer: Medicare Other | Admitting: Physical Therapy

## 2018-02-23 ENCOUNTER — Other Ambulatory Visit (HOSPITAL_COMMUNITY): Payer: Self-pay | Admitting: *Deleted

## 2018-02-23 ENCOUNTER — Ambulatory Visit: Payer: Medicare Other | Attending: Orthopedic Surgery | Admitting: Physical Therapy

## 2018-02-23 ENCOUNTER — Encounter: Payer: Self-pay | Admitting: Physical Therapy

## 2018-02-23 DIAGNOSIS — M6281 Muscle weakness (generalized): Secondary | ICD-10-CM

## 2018-02-23 DIAGNOSIS — R2689 Other abnormalities of gait and mobility: Secondary | ICD-10-CM | POA: Diagnosis not present

## 2018-02-23 DIAGNOSIS — R2681 Unsteadiness on feet: Secondary | ICD-10-CM | POA: Diagnosis not present

## 2018-02-23 DIAGNOSIS — R293 Abnormal posture: Secondary | ICD-10-CM | POA: Insufficient documentation

## 2018-02-24 ENCOUNTER — Ambulatory Visit (HOSPITAL_COMMUNITY)
Admission: RE | Admit: 2018-02-24 | Discharge: 2018-02-24 | Disposition: A | Discharge status: Home / Self Care | Payer: No Typology Code available for payment source | Source: Ambulatory Visit | Attending: Nephrology | Admitting: Nephrology

## 2018-02-24 VITALS — BP 147/62 | HR 65 | Temp 98.7°F | Resp 20

## 2018-02-24 DIAGNOSIS — D631 Anemia in chronic kidney disease: Secondary | ICD-10-CM | POA: Insufficient documentation

## 2018-02-24 DIAGNOSIS — N184 Chronic kidney disease, stage 4 (severe): Secondary | ICD-10-CM | POA: Diagnosis not present

## 2018-02-24 DIAGNOSIS — Z79899 Other long term (current) drug therapy: Secondary | ICD-10-CM | POA: Diagnosis not present

## 2018-02-24 DIAGNOSIS — Z5181 Encounter for therapeutic drug level monitoring: Secondary | ICD-10-CM | POA: Insufficient documentation

## 2018-02-24 LAB — FERRITIN: Ferritin: 227 ng/mL (ref 11–307)

## 2018-02-24 LAB — POCT HEMOGLOBIN-HEMACUE: Hemoglobin: 9.1 g/dL — ABNORMAL LOW (ref 12.0–15.0)

## 2018-02-24 LAB — IRON AND TIBC
Iron: 44 ug/dL (ref 28–170)
Saturation Ratios: 15 % (ref 10.4–31.8)
TIBC: 302 ug/dL (ref 250–450)
UIBC: 258 ug/dL

## 2018-02-24 MED ORDER — DARBEPOETIN ALFA 200 MCG/0.4ML IJ SOSY
PREFILLED_SYRINGE | INTRAMUSCULAR | Status: AC
  Administered 2018-02-24: 200 ug via SUBCUTANEOUS
  Filled 2018-02-24: qty 0.4

## 2018-02-24 MED ORDER — DARBEPOETIN ALFA 200 MCG/0.4ML IJ SOSY
200.0000 ug | PREFILLED_SYRINGE | INTRAMUSCULAR | Status: DC
  Administered 2018-02-24: 200 ug via SUBCUTANEOUS

## 2018-02-24 NOTE — Therapy (Signed)
[facility] [address] [place], [place], [address] Phone: [phone]   Fax:  [phone]  Physical Therapy Treatment  Patient Details  Name: Brandy Houston MRN: [id number] Date of Birth: 1956/04/17 Referring Provider: Meridee Score, MD   Encounter Date: 02/23/2018    Past Medical History:  Diagnosis Date  . Anemia   . Anxiety   . Arthritis    "in my joints" (03/10/2017)  . Chronic diastolic CHF (congestive heart failure) ([place])   . CKD (chronic kidney disease), stage IV ([place])    stage IV. previous HD, none currently 10/30/15 (confirmed 02/05/2017 & 03/10/2017)  . Depression    Chronic  . Diabetic peripheral neuropathy ([place])   . Dysrhythmia    tachycardia, normal ECHO 08-09-14  . GERD (gastroesophageal reflux disease)   . Heart murmur     dx'd 02/05/2017  . Hepatitis C    "tx'd in 2016; I'm negative now" (02/05/2017)  . History of blood transfusion 2017   "w/knee replacement"  . Hypertension   . Osteoarthritis of left knee   . Peripheral neuropathy   . Protein calorie malnutrition ([place])   . Septic arthritis ([place])   . Slow transit constipation   . Type II diabetes mellitus ([name])    IDDM  . Uncontrolled hypertension 02/18/2013  . Unsteady gait     Past Surgical History:  Procedure Laterality Date  . AMPUTATION Left 03/20/2017   Procedure: LEFT ABOVE KNEE AMPUTATION;  Surgeon: Newt Minion, MD;  Location: [facility];  Service: Orthopedics;  Laterality: Left;  . APPENDECTOMY    . AV FISTULA PLACEMENT Left 09/13/2014   Procedure: Brachial Artery to Brachial Vein Gortex Four - Seven Stretch GRAFT INSERTION Left Forearm;  Surgeon: Mal Misty, MD;  Location: [facility];  Service: Vascular;  Laterality: Left;  . BASCILIC VEIN TRANSPOSITION Right 10/26/2017   Procedure: BASILIC VEIN TRANSPOSITION FIRST STAGE RIGHT ARM;  Surgeon: Conrad Leisuretowne, MD;  Location: [facility];  Service: Vascular;  Laterality: Right;  . BASCILIC VEIN  TRANSPOSITION Right 02/01/2018   Procedure: SECOND STAGE BASILIC VEIN TRANSPOSITION RIGHT UPPER EXTREMITY;  Surgeon: Marty Heck, MD;  Location: [facility];  Service: Vascular;  Laterality: Right;  . CHOLECYSTECTOMY OPEN    . COLON SURGERY    . EXCISIONAL TOTAL KNEE ARTHROPLASTY WITH ANTIBIOTIC SPACERS Left 02/06/2017   Procedure: Incisional total iknee with antibiotic spacer ;  Surgeon: Leandrew Koyanagi, MD;  Location: [facility];  Service: Orthopedics;  Laterality: Left;  . IR FLUORO GUIDE CV LINE RIGHT  02/10/2017  . IR REMOVAL TUN CV CATH W/O FL  04/01/2017  . IR US GUIDE VASC ACCESS RIGHT  02/10/2017  . IRRIGATION AND DEBRIDEMENT KNEE Left 03/12/2017   Procedure: IRRIGATION AND DEBRIDEMENT LEFT KNEE WITH WOUND VAC APPLICATION;  Surgeon: Leandrew Koyanagi, MD;  Location: [facility];  Service: Orthopedics;  Laterality: Left;  . JOINT REPLACEMENT    . KNEE ARTHROSCOPY Right 08/10/2014   Procedure: ARTHROSCOPY I & D KNEE;  Surgeon: Marianna Payment, MD;  [facility];  Service: Orthopedics;  Laterality: Right;  . KNEE ARTHROSCOPY Left 08/11/2014   Procedure: ARTHROSCOPIC WASHOUT LEFT KNEE;  Surgeon: Marianna Payment, MD;  Location: [facility];  Service: Orthopedics;  Laterality: Left;  . KNEE ARTHROSCOPY Left 08/19/2014   Procedure: ARTHROSCOPIC WASHOUT LEFT KNEE;  Surgeon: Leandrew Koyanagi, MD;  Location: [facility];  Service: Orthopedics;  Laterality: Left;  . KNEE ARTHROSCOPY WITH LATERAL MENISECTOMY Left  04/04/2015   Procedure: AND PARTIAL LATERAL MENISECTOMY;  Surgeon: Leandrew Koyanagi, MD;  Location: [facility];  Service: Orthopedics;  Laterality: Left;  . KNEE ARTHROSCOPY WITH MEDIAL MENISECTOMY Left 04/04/2015   Procedure: LEFT KNEE ARTHROSCOPY WITH PARTIAL MEDIAL MENISCECTOMY  AND SYNOVECTOMY;  Surgeon: Leandrew Koyanagi, MD;  Location: [facility];  Service: Orthopedics;  Laterality: Left;  . SHOULDER ARTHROSCOPY Bilateral 08/10/2014   Procedure: I & D BILATERAL SHOULDERS ;  Surgeon: Marianna Payment, MD;  [facility];  Service: Orthopedics;  Laterality: Bilateral;  . SMALL INTESTINE SURGERY     Due to Small Bowel Obstruction; "fixed it when they did my gallbladder OR"  . TEE WITHOUT CARDIOVERSION N/A 08/14/2014   Procedure: TRANSESOPHAGEAL ECHOCARDIOGRAM (TEE);  Surgeon: Thayer Headings, MD;  Location: [facility];  Service: Cardiovascular;  Laterality: N/A;  . TENOSYNOVECTOMY Right 08/11/2014   Procedure: RIGHT WRIST IRRIGATION AND DEBRIDEMENT, TENOSYNOVECTOMY;  Surgeon: Marianna Payment, MD;  Location: [facility];  Service: Orthopedics;  Laterality: Right;  . TOTAL KNEE ARTHROPLASTY Left 11/07/2015   Procedure: LEFT TOTAL KNEE ARTHROPLASTY WITH REVISION OF IMPLANTS;  Surgeon: Leandrew Koyanagi, MD;  Location: [facility];  Service: Orthopedics;  Laterality: Left;  . TUBAL LIGATION      There were no vitals filed for this visit.  Subjective Assessment - 02/23/18 1023    Subjective  Pt reports no changes since last visit, no pain. Patient did not go on cruise due to [facility] but rescheduled for later this month.     Pertinent History  left TFA, DM, neuropathy, Hep C, HTN, CHF, depression, CKD st 5 not dialysis, arthritis, heart murmur,     Limitations  Standing;Walking;House hold activities    Patient Stated Goals  To walk with prosthesis in home & community, travel plane, car & cruise in September    Currently in Pain?  No/denies                       [facility] Adult PT Treatment/Exercise - 02/23/18 1015      Transfers   Transfers  Sit to Stand;Stand to Sit    Sit to Stand  5: Supervision;With upper extremity assist;With armrests;From chair/3-in-1   working on not touching for stabilization   Stand to Sit  5: Supervision;With upper extremity assist;With armrests;To chair/3-in-1   working on not touching for stabilization     Ambulation/Gait   Ambulation/Gait  Yes    Ambulation/Gait Assistance  5: Supervision;3: Mod assist   Sikes cane/HHA & supervision rollator    Ambulation/Gait Assistance Details  tactile & verbal cues on posture and wt shift over prosthesis in stance    Ambulation Distance (Feet)  300 Feet   300' Rollator & 95' with cane/HHA   Assistive device  Rollator;Prosthesis;Straight cane;1 person hand held assist    Stairs  Yes    Stairs Assistance  5: Supervision    Stairs Assistance Details (indicate cue type and reason)  cues on single rail & cane    Stair Management Technique  One rail Left;Step to pattern;Forwards;With cane    Number of Stairs  4      High Level Balance   High Level Balance Activities  Side stepping;Backward walking;Turns    High Level Balance Comments  In parallel bars working with cane RUE & bar LUE: tactile/manual cues & demo, verbal cues on technique for movement in sidestepping, backward, turn 90* to right & left.  Neuro Re-ed    Neuro Re-ed Details   standing balance without (intermittent) UE support in parallel bars: EO head turns up/down & right/left and static with EC.                 PT Short Term Goals - 02/23/18 1710      PT SHORT TERM GOAL #1   Title  Patient standing balance without UE support head turns to scan with loss of balance. (All STGs Target Date: 03/12/2018)    Time  1    Period  Months    Status  New    Target Date  03/12/18      PT SHORT TERM GOAL #2   Title  patient reports prosthesis wear daily >/= 12 hrs/day    Time  1    Period  Months    Status  Revised    Target Date  04/01/18      PT SHORT TERM GOAL #3   Title  Patient picks up 3# objects from floor with cane support with prosthesis with supervision.     Time  1    Period  Months    Status  Revised    Target Date  04/01/18      PT SHORT TERM GOAL #4   Title  Patient ambulates 500' with rollator walker & prosthesis with supervision.     Time  1    Period  Months    Status  Revised    Target Date  04/01/18      PT SHORT TERM GOAL #5   Title  Patient ambulates 56' with single point cane quad tip &  prosthesis with minA.     Time  1    Period  Months    Status  New    Target Date  04/01/18        PT Long Term Goals - 02/23/18 1709      PT LONG TERM GOAL #1   Title  Patient verbalizes & demonstrates proper prosthetic care to enable safe use of prosthesis. (All LTGs Target Date: 04/01/2018)    Time  3    Period  Months    Status  On-going    Target Date  04/01/18      PT LONG TERM GOAL #2   Title  Patient tolerates & reports prosthesis wear daily >75% of awake hours without skin issues or limb pain.     Time  3    Period  Months    Status  On-going    Target Date  04/01/18      PT LONG TERM GOAL #3   Title  Patient ambulates 500' outdoors including grass with LRAD & prosthesis modified independent for community mobility.     Time  3    Period  Months    Status  On-going    Target Date  04/01/18      PT LONG TERM GOAL #4   Title  Patient negotiates ramps, curbs & stairs with LRAD & prosthesis modified independent to enable community mobility.     Time  3    Period  Months    Status  On-going    Target Date  04/01/18      PT LONG TERM GOAL #5   Title  Task of Berg Balance Test with walker support >45/56    Time  3    Period  Months    Status  On-going    Target Date  04/01/18            Plan - 02/23/18 1713    Clinical Impression Statement  Patient appears to understand updated HEP to include standing balance near support: turning head up/down & right/left and static with light 1 finger touch eyes closed.     Rehab Potential  Good    Clinical Impairments Affecting Rehab Potential  transfemoral level amputation with multiple medical issues, significantly limited activity level for >2 years    PT Frequency  2x / week    PT Duration  12 weeks    PT Treatment/Interventions  ADLs/Self Care Home Management;Moist Heat;DME Instruction;Gait training;Stair training;Functional mobility training;Therapeutic activities;Therapeutic exercise;Balance training;Neuromuscular  re-education;Patient/family education;Prosthetic Training;Manual techniques;Vestibular    PT Next Visit Plan  work towards updated STGs.     Consulted and Agree with Plan of Care  Patient       Patient will benefit from skilled therapeutic intervention in order to improve the following deficits and impairments:  Abnormal gait, Decreased activity tolerance, Decreased balance, Decreased endurance, Decreased knowledge of use of DME, Decreased mobility, Decreased strength, Decreased range of motion, Impaired flexibility, Prosthetic Dependency, Postural dysfunction  Visit Diagnosis: Other abnormalities of gait and mobility  Muscle weakness (generalized)  Abnormal posture  Unsteadiness on feet     Problem List Patient Active Problem List   Diagnosis Date Noted  . Benign essential HTN   . Stage 5 chronic kidney disease not on chronic dialysis ([place])   . Above knee amputation status, left ([place])   . DM type 2 causing CKD stage 5 ([place]) 03/21/2017  . Anemia of chronic disease 02/05/2017  . Dysplasia of cervix, high grade CIN 2 01/12/2017  . Chronic diastolic CHF (congestive heart failure) ([place]) 11/08/2015  . Total knee replacement status   . Diabetic peripheral neuropathy ([place])   . Major depression, recurrent, chronic ([place])   . Chronic kidney disease (CKD), stage IV (severe) ([place])   . Hepatitis C 08/16/2014  . Peripheral neuropathy 06/24/2013  . Compulsive tobacco user syndrome 06/24/2013     Bjorn Loser, PTA provided first 15 minutes of skilled care during today's session.   Lamanda Rudder PT, DPT 02/24/2018, 5:17 PM  [facility] [address] [place], [place], [address] Phone: [phone]   Fax:  [phone]  Name: Brandy Houston MRN: [id number] Date of Birth: Nov 29, 1955

## 2018-02-25 ENCOUNTER — Ambulatory Visit: Payer: Medicare Other | Admitting: Physical Therapy

## 2018-03-01 ENCOUNTER — Other Ambulatory Visit (INDEPENDENT_AMBULATORY_CARE_PROVIDER_SITE_OTHER): Payer: Self-pay | Admitting: Orthopedic Surgery

## 2018-03-01 NOTE — Telephone Encounter (Signed)
Ok for refill? 

## 2018-03-02 ENCOUNTER — Ambulatory Visit: Payer: Medicare Other | Admitting: Physical Therapy

## 2018-03-02 ENCOUNTER — Encounter: Payer: Self-pay | Admitting: Physical Therapy

## 2018-03-02 DIAGNOSIS — R2689 Other abnormalities of gait and mobility: Secondary | ICD-10-CM | POA: Diagnosis not present

## 2018-03-02 DIAGNOSIS — R2681 Unsteadiness on feet: Secondary | ICD-10-CM | POA: Diagnosis not present

## 2018-03-02 DIAGNOSIS — R293 Abnormal posture: Secondary | ICD-10-CM | POA: Diagnosis not present

## 2018-03-02 DIAGNOSIS — M6281 Muscle weakness (generalized): Secondary | ICD-10-CM | POA: Diagnosis not present

## 2018-03-03 DIAGNOSIS — D631 Anemia in chronic kidney disease: Secondary | ICD-10-CM | POA: Diagnosis not present

## 2018-03-03 DIAGNOSIS — I770 Arteriovenous fistula, acquired: Secondary | ICD-10-CM | POA: Diagnosis not present

## 2018-03-03 DIAGNOSIS — I120 Hypertensive chronic kidney disease with stage 5 chronic kidney disease or end stage renal disease: Secondary | ICD-10-CM | POA: Diagnosis not present

## 2018-03-03 DIAGNOSIS — N2581 Secondary hyperparathyroidism of renal origin: Secondary | ICD-10-CM | POA: Diagnosis not present

## 2018-03-03 DIAGNOSIS — N185 Chronic kidney disease, stage 5: Secondary | ICD-10-CM | POA: Diagnosis not present

## 2018-03-03 DIAGNOSIS — G629 Polyneuropathy, unspecified: Secondary | ICD-10-CM | POA: Diagnosis not present

## 2018-03-03 DIAGNOSIS — N049 Nephrotic syndrome with unspecified morphologic changes: Secondary | ICD-10-CM | POA: Diagnosis not present

## 2018-03-03 DIAGNOSIS — Z23 Encounter for immunization: Secondary | ICD-10-CM | POA: Diagnosis not present

## 2018-03-03 DIAGNOSIS — E1122 Type 2 diabetes mellitus with diabetic chronic kidney disease: Secondary | ICD-10-CM | POA: Diagnosis not present

## 2018-03-03 LAB — BASIC METABOLIC PANEL
BUN: 97 — AB (ref 4–21)
Creatinine: 5.6 — AB (ref <=1.1)
Glucose: 80
Potassium: 3.2 — AB (ref 3.4–5.3)
Sodium: 139 (ref 137–147)

## 2018-03-03 NOTE — Therapy (Signed)
[facility] [address] [place], [place], [address] Phone: [phone]   Fax:  [phone]  Physical Therapy Treatment  Patient Details  Name: Brandy Houston MRN: [id number] Date of Birth: 1956/04/06 Referring Provider: Meridee Score, MD   Encounter Date: 03/02/2018  PT End of Session - 03/02/18 1800    Visit Number  27    Number of Visits  42    Date for PT Re-Evaluation  04/02/18    Authorization Type  Medicare & Medicaid    PT Start Time  3007    PT Stop Time  1100    PT Time Calculation (min)  45 min    Equipment Utilized During Treatment  Gait belt    Activity Tolerance  Patient tolerated treatment well    Behavior During Therapy  WFL for tasks assessed/performed       Past Medical History:  Diagnosis Date  . Anemia   . Anxiety   . Arthritis    "in my joints" (03/10/2017)  . Chronic diastolic CHF (congestive heart failure) ([place])   . CKD (chronic kidney disease), stage IV ([place])    stage IV. previous HD, none currently 10/30/15 (confirmed 02/05/2017 & 03/10/2017)  . Depression    Chronic  . Diabetic peripheral neuropathy ([place])   . Dysrhythmia    tachycardia, normal ECHO 08-09-14  . GERD (gastroesophageal reflux disease)   . Heart murmur     dx'd 02/05/2017  . Hepatitis C    "tx'd in 2016; I'm negative now" (02/05/2017)  . History of blood transfusion 2017   "w/knee replacement"  . Hypertension   . Osteoarthritis of left knee   . Peripheral neuropathy   . Protein calorie malnutrition ([place])   . Septic arthritis ([place])   . Slow transit constipation   . Type II diabetes mellitus ([name])    IDDM  . Uncontrolled hypertension 02/18/2013  . Unsteady gait     Past Surgical History:  Procedure Laterality Date  . AMPUTATION Left 03/20/2017   Procedure: LEFT ABOVE KNEE AMPUTATION;  Surgeon: Newt Minion, MD;  Location: [facility];  Service: Orthopedics;  Laterality: Left;  . APPENDECTOMY    . AV FISTULA PLACEMENT Left  09/13/2014   Procedure: Brachial Artery to Brachial Vein Gortex Four - Seven Stretch GRAFT INSERTION Left Forearm;  Surgeon: Mal Misty, MD;  Location: [facility];  Service: Vascular;  Laterality: Left;  . BASCILIC VEIN TRANSPOSITION Right 10/26/2017   Procedure: BASILIC VEIN TRANSPOSITION FIRST STAGE RIGHT ARM;  Surgeon: Conrad Adelphi, MD;  Location: [facility];  Service: Vascular;  Laterality: Right;  . BASCILIC VEIN TRANSPOSITION Right 02/01/2018   Procedure: SECOND STAGE BASILIC VEIN TRANSPOSITION RIGHT UPPER EXTREMITY;  Surgeon: Marty Heck, MD;  Location: [facility];  Service: Vascular;  Laterality: Right;  . CHOLECYSTECTOMY OPEN    . COLON SURGERY    . EXCISIONAL TOTAL KNEE ARTHROPLASTY WITH ANTIBIOTIC SPACERS Left 02/06/2017   Procedure: Incisional total iknee with antibiotic spacer ;  Surgeon: Leandrew Koyanagi, MD;  Location: [facility];  Service: Orthopedics;  Laterality: Left;  . IR FLUORO GUIDE CV LINE RIGHT  02/10/2017  . IR REMOVAL TUN CV CATH W/O FL  04/01/2017  . IR US GUIDE VASC ACCESS RIGHT  02/10/2017  . IRRIGATION AND DEBRIDEMENT KNEE Left 03/12/2017   Procedure: IRRIGATION AND DEBRIDEMENT LEFT KNEE WITH WOUND VAC APPLICATION;  Surgeon: Leandrew Koyanagi, MD;  Location: [facility];  Service: Orthopedics;  Laterality: Left;  .  JOINT REPLACEMENT    . KNEE ARTHROSCOPY Right 08/10/2014   Procedure: ARTHROSCOPY I & D KNEE;  Surgeon: Marianna Payment, MD;  [facility];  Service: Orthopedics;  Laterality: Right;  . KNEE ARTHROSCOPY Left 08/11/2014   Procedure: ARTHROSCOPIC WASHOUT LEFT KNEE;  Surgeon: Marianna Payment, MD;  Location: [facility];  Service: Orthopedics;  Laterality: Left;  . KNEE ARTHROSCOPY Left 08/19/2014   Procedure: ARTHROSCOPIC WASHOUT LEFT KNEE;  Surgeon: Leandrew Koyanagi, MD;  Location: [facility];  Service: Orthopedics;  Laterality: Left;  . KNEE ARTHROSCOPY WITH LATERAL MENISECTOMY Left 04/04/2015   Procedure: AND PARTIAL LATERAL MENISECTOMY;  Surgeon: Leandrew Koyanagi, MD;  Location: [facility];  Service: Orthopedics;  Laterality: Left;  . KNEE ARTHROSCOPY WITH MEDIAL MENISECTOMY Left 04/04/2015   Procedure: LEFT KNEE ARTHROSCOPY WITH PARTIAL MEDIAL MENISCECTOMY  AND SYNOVECTOMY;  Surgeon: Leandrew Koyanagi, MD;  Location: [facility];  Service: Orthopedics;  Laterality: Left;  . SHOULDER ARTHROSCOPY Bilateral 08/10/2014   Procedure: I & D BILATERAL SHOULDERS ;  Surgeon: Marianna Payment, MD;  [facility];  Service: Orthopedics;  Laterality: Bilateral;  . SMALL INTESTINE SURGERY     Due to Small Bowel Obstruction; "fixed it when they did my gallbladder OR"  . TEE WITHOUT CARDIOVERSION N/A 08/14/2014   Procedure: TRANSESOPHAGEAL ECHOCARDIOGRAM (TEE);  Surgeon: Thayer Headings, MD;  Location: [facility];  Service: Cardiovascular;  Laterality: N/A;  . TENOSYNOVECTOMY Right 08/11/2014   Procedure: RIGHT WRIST IRRIGATION AND DEBRIDEMENT, TENOSYNOVECTOMY;  Surgeon: Marianna Payment, MD;  Location: [facility];  Service: Orthopedics;  Laterality: Right;  . TOTAL KNEE ARTHROPLASTY Left 11/07/2015   Procedure: LEFT TOTAL KNEE ARTHROPLASTY WITH REVISION OF IMPLANTS;  Surgeon: Leandrew Koyanagi, MD;  Location: [facility];  Service: Orthopedics;  Laterality: Left;  . TUBAL LIGATION      There were no vitals filed for this visit.  Subjective Assessment - 03/02/18 1027    Subjective  She had near fall with trying to stand up but was able to catch herself.     Pertinent History  left TFA, DM, neuropathy, Hep C, HTN, CHF, depression, CKD st 5 not dialysis, arthritis, heart murmur,     Limitations  Standing;Walking;House hold activities    Patient Stated Goals  To walk with prosthesis in home & community, travel plane, car & cruise in September    Currently in Pain?  No/denies                       [facility] Adult PT Treatment/Exercise - 03/02/18 1025      Transfers   Transfers  Sit to Stand;Stand to Sit    Sit to Stand  5: Supervision;With upper extremity  assist;With armrests;From chair/3-in-1   working on not touching for stabilization   Sit to Stand Details  Visual cues for safe use of DME/AE;Visual cues/gestures for sequencing;Verbal cues for sequencing;Verbal cues for technique;Verbal cues for safe use of DME/AE    Stand to Sit  5: Supervision;With upper extremity assist;With armrests;To chair/3-in-1   working on not touching for stabilization   Stand to Sit Details (indicate cue type and reason)  Visual cues for safe use of DME/AE;Visual cues/gestures for sequencing;Verbal cues for sequencing;Verbal cues for technique;Verbal cues for safe use of DME/AE      Ambulation/Gait   Ambulation/Gait  Yes    Ambulation/Gait Assistance  5: Supervision;4: Min assist   MinA cane/HHA & supervision counter/cane or  rollator   Ambulation/Gait Assistance Details  PT demo & instructed in sit to/from stand with cane on rt & switching cane to LUE for gait,  weight shift over prosthesis in stance and upright posture.  Progressed to prosthetic gait with cane RUE & counter support with LUE, return to chair sidestepping with cane/counter.     Ambulation Distance (Feet)  10 Feet   10' X 6 with cane/HHA and 15' X 4 with cane/counter   Assistive device  Rollator;Prosthesis;Straight cane;1 person hand held assist;Other (Comment)   cane & HHA and cane & counter support   Ambulation Surface  Indoor;Level    Stairs  --    Stairs Assistance  --    Stair Management Technique  --    Number of Stairs  --      High Level Balance   High Level Balance Activities  Side stepping    High Level Balance Comments  counter support LUE & cane RUE: cues on weight shift.       Neuro Re-ed    Neuro Re-ed Details   standing balance with cane support with RUE and with LUE: head turns to scan.       Prosthetics   Current prosthetic wear tolerance (days/week)   reports daily    Current prosthetic wear tolerance (#hours/day)   reports 8 hrs/day               PT Short Term  Goals - 02/23/18 1710      PT SHORT TERM GOAL #1   Title  Patient standing balance without UE support head turns to scan with loss of balance. (All STGs Target Date: 03/12/2018)    Time  1    Period  Months    Status  New    Target Date  03/12/18      PT SHORT TERM GOAL #2   Title  patient reports prosthesis wear daily >/= 12 hrs/day    Time  1    Period  Months    Status  Revised    Target Date  04/01/18      PT SHORT TERM GOAL #3   Title  Patient picks up 3# objects from floor with cane support with prosthesis with supervision.     Time  1    Period  Months    Status  Revised    Target Date  04/01/18      PT SHORT TERM GOAL #4   Title  Patient ambulates 500' with rollator walker & prosthesis with supervision.     Time  1    Period  Months    Status  Revised    Target Date  04/01/18      PT SHORT TERM GOAL #5   Title  Patient ambulates 7' with single point cane quad tip & prosthesis with minA.     Time  1    Period  Months    Status  New    Target Date  04/01/18        PT Long Term Goals - 02/23/18 1709      PT LONG TERM GOAL #1   Title  Patient verbalizes & demonstrates proper prosthetic care to enable safe use of prosthesis. (All LTGs Target Date: 04/01/2018)    Time  3    Period  Months    Status  On-going    Target Date  04/01/18      PT LONG TERM GOAL #2   Title  Patient tolerates & reports prosthesis wear daily >75% of awake hours without skin issues or limb pain.     Time  3    Period  Months    Status  On-going    Target Date  04/01/18      PT LONG TERM GOAL #3   Title  Patient ambulates 500' outdoors including grass with LRAD & prosthesis modified independent for community mobility.     Time  3    Period  Months    Status  On-going    Target Date  04/01/18      PT LONG TERM GOAL #4   Title  Patient negotiates ramps, curbs & stairs with LRAD & prosthesis modified independent to enable community mobility.     Time  3    Period  Months     Status  On-going    Target Date  04/01/18      PT LONG TERM GOAL #5   Title  Task of Berg Balance Test with walker support >45/56    Time  3    Period  Months    Status  On-going    Target Date  04/01/18            Plan - 03/02/18 1832    Clinical Impression Statement  Patient improved ability to sit to/from stand with cane only for support with instruction in proper technique. Progressed prosthetic gait with cane to ambulating with counter & cane support safely to enable practice at home. She would benefit from further work to advance with less counter support.     Rehab Potential  Good    Clinical Impairments Affecting Rehab Potential  transfemoral level amputation with multiple medical issues, significantly limited activity level for >2 years    PT Frequency  2x / week    PT Duration  12 weeks    PT Treatment/Interventions  ADLs/Self Care Home Management;Moist Heat;DME Instruction;Gait training;Stair training;Functional mobility training;Therapeutic activities;Therapeutic exercise;Balance training;Neuromuscular re-education;Patient/family education;Prosthetic Training;Manual techniques;Vestibular    PT Next Visit Plan  Progress towards LTGs, cane gait with less RUE support (hand hold or counter), standing balance with cane including picking up items from floor.     Consulted and Agree with Plan of Care  Patient       Patient will benefit from skilled therapeutic intervention in order to improve the following deficits and impairments:  Abnormal gait, Decreased activity tolerance, Decreased balance, Decreased endurance, Decreased knowledge of use of DME, Decreased mobility, Decreased strength, Decreased range of motion, Impaired flexibility, Prosthetic Dependency, Postural dysfunction  Visit Diagnosis: Other abnormalities of gait and mobility  Muscle weakness (generalized)  Abnormal posture  Unsteadiness on feet     Problem List Patient Active Problem List   Diagnosis  Date Noted  . Benign essential HTN   . Stage 5 chronic kidney disease not on chronic dialysis ([place])   . Above knee amputation status, left ([place])   . DM type 2 causing CKD stage 5 ([place]) 03/21/2017  . Anemia of chronic disease 02/05/2017  . Dysplasia of cervix, high grade CIN 2 01/12/2017  . Chronic diastolic CHF (congestive heart failure) ([place]) 11/08/2015  . Total knee replacement status   . Diabetic peripheral neuropathy ([place])   . Major depression, recurrent, chronic ([place])   . Chronic kidney disease (CKD), stage IV (severe) ([place])   . Hepatitis C 08/16/2014  . Peripheral neuropathy 06/24/2013  . Compulsive tobacco user syndrome 06/24/2013    Rayana Geurin PT, DPT 03/03/2018, 8:35 AM  [facility] [address] [place], [place], [address] Phone: [phone]   Fax:  [phone]  Name: Brandy Houston MRN: [id number] Date of Birth: 01-25-1956

## 2018-03-04 ENCOUNTER — Encounter: Payer: Medicare Other | Admitting: Physical Therapy

## 2018-03-09 ENCOUNTER — Encounter: Payer: Medicare Other | Admitting: Physical Therapy

## 2018-03-10 ENCOUNTER — Encounter (HOSPITAL_COMMUNITY): Payer: Medicare Other

## 2018-03-11 ENCOUNTER — Encounter: Payer: Medicare Other | Admitting: Physical Therapy

## 2018-03-16 ENCOUNTER — Ambulatory Visit: Payer: Medicare Other | Attending: Orthopedic Surgery | Admitting: Physical Therapy

## 2018-03-16 ENCOUNTER — Encounter: Payer: Self-pay | Admitting: Physical Therapy

## 2018-03-16 DIAGNOSIS — R2681 Unsteadiness on feet: Secondary | ICD-10-CM | POA: Diagnosis not present

## 2018-03-16 DIAGNOSIS — R262 Difficulty in walking, not elsewhere classified: Secondary | ICD-10-CM | POA: Diagnosis not present

## 2018-03-16 DIAGNOSIS — R293 Abnormal posture: Secondary | ICD-10-CM | POA: Diagnosis not present

## 2018-03-16 DIAGNOSIS — M6281 Muscle weakness (generalized): Secondary | ICD-10-CM

## 2018-03-16 DIAGNOSIS — R2689 Other abnormalities of gait and mobility: Secondary | ICD-10-CM

## 2018-03-16 NOTE — Therapy (Signed)
[facility] [address] [place], [place], [address] Phone: [phone]   Fax:  [phone]  Physical Therapy Treatment  Patient Details  Name: Brandy Houston MRN: [id number] Date of Birth: 1955-11-01 Referring Provider (PT): Meridee Score, MD   Encounter Date: 03/16/2018  PT End of Session - 03/16/18 1024    Visit Number  28    Number of Visits  42    Date for PT Re-Evaluation  04/02/18    Authorization Type  Medicare & Medicaid    PT Start Time  1021   pt late for appt today   PT Stop Time  1100    PT Time Calculation (min)  39 min    Equipment Utilized During Treatment  Gait belt    Activity Tolerance  Patient tolerated treatment well    Behavior During Therapy  WFL for tasks assessed/performed       Past Medical History:  Diagnosis Date  . Anemia   . Anxiety   . Arthritis    "in my joints" (03/10/2017)  . Chronic diastolic CHF (congestive heart failure) ([place])   . CKD (chronic kidney disease), stage IV ([place])    stage IV. previous HD, none currently 10/30/15 (confirmed 02/05/2017 & 03/10/2017)  . Depression    Chronic  . Diabetic peripheral neuropathy ([place])   . Dysrhythmia    tachycardia, normal ECHO 08-09-14  . GERD (gastroesophageal reflux disease)   . Heart murmur     dx'd 02/05/2017  . Hepatitis C    "tx'd in 2016; I'm negative now" (02/05/2017)  . History of blood transfusion 2017   "w/knee replacement"  . Hypertension   . Osteoarthritis of left knee   . Peripheral neuropathy   . Protein calorie malnutrition ([place])   . Septic arthritis ([place])   . Slow transit constipation   . Type II diabetes mellitus ([name])    IDDM  . Uncontrolled hypertension 02/18/2013  . Unsteady gait     Past Surgical History:  Procedure Laterality Date  . AMPUTATION Left 03/20/2017   Procedure: LEFT ABOVE KNEE AMPUTATION;  Surgeon: Newt Minion, MD;  Location: [facility];  Service: Orthopedics;  Laterality: Left;  . APPENDECTOMY     . AV FISTULA PLACEMENT Left 09/13/2014   Procedure: Brachial Artery to Brachial Vein Gortex Four - Seven Stretch GRAFT INSERTION Left Forearm;  Surgeon: Mal Misty, MD;  Location: [facility];  Service: Vascular;  Laterality: Left;  . BASCILIC VEIN TRANSPOSITION Right 10/26/2017   Procedure: BASILIC VEIN TRANSPOSITION FIRST STAGE RIGHT ARM;  Surgeon: Conrad Brewster, MD;  Location: [facility];  Service: Vascular;  Laterality: Right;  . BASCILIC VEIN TRANSPOSITION Right 02/01/2018   Procedure: SECOND STAGE BASILIC VEIN TRANSPOSITION RIGHT UPPER EXTREMITY;  Surgeon: Marty Heck, MD;  Location: [facility];  Service: Vascular;  Laterality: Right;  . CHOLECYSTECTOMY OPEN    . COLON SURGERY    . EXCISIONAL TOTAL KNEE ARTHROPLASTY WITH ANTIBIOTIC SPACERS Left 02/06/2017   Procedure: Incisional total iknee with antibiotic spacer ;  Surgeon: Leandrew Koyanagi, MD;  Location: [facility];  Service: Orthopedics;  Laterality: Left;  . IR FLUORO GUIDE CV LINE RIGHT  02/10/2017  . IR REMOVAL TUN CV CATH W/O FL  04/01/2017  . IR US GUIDE VASC ACCESS RIGHT  02/10/2017  . IRRIGATION AND DEBRIDEMENT KNEE Left 03/12/2017   Procedure: IRRIGATION AND DEBRIDEMENT LEFT KNEE WITH WOUND VAC APPLICATION;  Surgeon: Leandrew Koyanagi, MD;  Location: [facility];  Service: Orthopedics;  Laterality: Left;  . JOINT REPLACEMENT    . KNEE ARTHROSCOPY Right 08/10/2014   Procedure: ARTHROSCOPY I & D KNEE;  Surgeon: Marianna Payment, MD;  Location: [facility];  Service: Orthopedics;  Laterality: Right;  . KNEE ARTHROSCOPY Left 08/11/2014   Procedure: ARTHROSCOPIC WASHOUT LEFT KNEE;  Surgeon: Marianna Payment, MD;  Location: [facility];  Service: Orthopedics;  Laterality: Left;  . KNEE ARTHROSCOPY Left 08/19/2014   Procedure: ARTHROSCOPIC WASHOUT LEFT KNEE;  Surgeon: Leandrew Koyanagi, MD;  Location: [facility];  Service: Orthopedics;  Laterality: Left;  . KNEE ARTHROSCOPY WITH LATERAL MENISECTOMY Left 04/04/2015   Procedure: AND PARTIAL LATERAL MENISECTOMY;  Surgeon: Leandrew Koyanagi, MD;  Location: [facility];  Service: Orthopedics;  Laterality: Left;  . KNEE ARTHROSCOPY WITH MEDIAL MENISECTOMY Left 04/04/2015   Procedure: LEFT KNEE ARTHROSCOPY WITH PARTIAL MEDIAL MENISCECTOMY  AND SYNOVECTOMY;  Surgeon: Leandrew Koyanagi, MD;  Location: [facility];  Service: Orthopedics;  Laterality: Left;  . SHOULDER ARTHROSCOPY Bilateral 08/10/2014   Procedure: I & D BILATERAL SHOULDERS ;  Surgeon: Marianna Payment, MD;  Location: [facility];  Service: Orthopedics;  Laterality: Bilateral;  . SMALL INTESTINE SURGERY     Due to Small Bowel Obstruction; "fixed it when they did my gallbladder OR"  . TEE WITHOUT CARDIOVERSION N/A 08/14/2014   Procedure: TRANSESOPHAGEAL ECHOCARDIOGRAM (TEE);  Surgeon: Thayer Headings, MD;  Location: [facility];  Service: Cardiovascular;  Laterality: N/A;  . TENOSYNOVECTOMY Right 08/11/2014   Procedure: RIGHT WRIST IRRIGATION AND DEBRIDEMENT, TENOSYNOVECTOMY;  Surgeon: Marianna Payment, MD;  Location: [facility];  Service: Orthopedics;  Laterality: Right;  . TOTAL KNEE ARTHROPLASTY Left 11/07/2015   Procedure: LEFT TOTAL KNEE ARTHROPLASTY WITH REVISION OF IMPLANTS;  Surgeon: Leandrew Koyanagi, MD;  Location: [facility];  Service: Orthopedics;  Laterality: Left;  . TUBAL LIGATION      There were no vitals filed for this visit.  Subjective Assessment - 03/16/18 1022    Subjective  Denies any falls. Just got back into town on Sunday from her cruise. Reports she wore the leg somedays all day long and was sore afterwards. Took a break from the prosthesis yesterday. To therapy today with rollator.     Pertinent History  left TFA, DM, neuropathy, Hep C, HTN, CHF, depression, CKD st 5 not dialysis, arthritis, heart murmur,     Limitations  Standing;Walking;House hold activities    Patient Stated Goals  To walk with prosthesis in home & community, travel plane, car & cruise in September    Currently in Pain?  No/denies    Pain Score  0-No pain            [facility] Adult PT Treatment/Exercise - 03/16/18 1025      Transfers   Transfers  Sit to Stand;Stand to Sit    Sit to Stand  5: Supervision;With upper extremity assist;With armrests;From chair/3-in-1    Sit to Stand Details  Verbal cues for sequencing;Verbal cues for technique;Verbal cues for safe use of DME/AE    Stand to Sit  5: Supervision;With upper extremity assist;With armrests;To chair/3-in-1    Stand to Sit Details (indicate cue type and reason)  Verbal cues for sequencing;Verbal cues for technique;Verbal cues for safe use of DME/AE      Ambulation/Gait   Ambulation/Gait  Yes    Ambulation/Gait Assistance  4: Min guard;4: Min assist    Ambulation/Gait Assistance Details  cues needed for posture, step placement,  weight shifting and cane placement. pt able to independently recall swithing hands with cane before standing up/prior to sitting down.     Ambulation Distance (Feet)  15 Feet   x6, 10 x1   Assistive device  Straight cane;Prosthesis;1 person hand held assist   cane with rubber quad tip   Gait Pattern  Step-through pattern;Decreased step length - right;Decreased step length - left;Decreased weight shift to left;Narrow base of support    Ambulation Surface  Level;Indoor    Stairs  Yes    Stairs Assistance  5: Supervision    Stairs Assistance Details (indicate cue type and reason)  1 rep each with right rail/cane, then left rail/cane wtih cues on sequencing and weight shifting. cues on posture as well.     Stair Management Technique  One rail Right;One rail Left;Step to pattern;Forwards;With cane    Number of Stairs  4   x2     Prosthetics   Prosthetic Care Comments   pt reports decreased wearing of prosthesis due to decreased comfort with sitting with it on. Reports it cuts into her groin. Has appropriate amount of socks on for fit. Plans to call Richardson Landry with [facility] to make an appt to have this addressed.                   Current prosthetic wear tolerance (days/week)    reports daily    Current prosthetic wear tolerance (#hours/day)   reports 4-8 hours a day.    Residual limb condition   intact per pt report    Donning Prosthesis  Modified independent (device/increased time)    Doffing Prosthesis  Modified independent (device/increased time)           PT Short Term Goals - 03/16/18 2128      PT SHORT TERM GOAL #1   Title  Patient standing balance without UE support head turns to scan with loss of balance. (All STGs Target Date: 03/12/2018)    Time  1    Period  Months    Status  New      PT SHORT TERM GOAL #2   Title  patient reports prosthesis wear daily >/= 12 hrs/day    Baseline  03/16/18: met most days, did take a day off after her cruise    Status  Partially Met      PT SHORT TERM GOAL #3   Title  Patient picks up 3# objects from floor with cane support with prosthesis with supervision.     Time  1    Period  Months    Status  On-going      PT SHORT TERM GOAL #4   Title  Patient ambulates 500' with rollator walker & prosthesis with supervision.     Time  1    Status  On-going      PT SHORT TERM GOAL #5   Title  Patient ambulates 38' with single point cane quad tip & prosthesis with minA.     Status  On-going        PT Long Term Goals - 02/23/18 1709      PT LONG TERM GOAL #1   Title  Patient verbalizes & demonstrates proper prosthetic care to enable safe use of prosthesis. (All LTGs Target Date: 04/01/2018)    Time  3    Period  Months    Status  On-going    Target Date  04/01/18      PT LONG TERM GOAL #2  Title  Patient tolerates & reports prosthesis wear daily >75% of awake hours without skin issues or limb pain.     Time  3    Period  Months    Status  On-going    Target Date  04/01/18      PT LONG TERM GOAL #3   Title  Patient ambulates 500' outdoors including grass with LRAD & prosthesis modified independent for community mobility.     Time  3    Period  Months    Status  On-going    Target Date  04/01/18       PT LONG TERM GOAL #4   Title  Patient negotiates ramps, curbs & stairs with LRAD & prosthesis modified independent to enable community mobility.     Time  3    Period  Months    Status  On-going    Target Date  04/01/18      PT LONG TERM GOAL #5   Title  Task of Berg Balance Test with walker support >45/56    Time  3    Period  Months    Status  On-going    Target Date  04/01/18            Plan - 03/16/18 1024    Clinical Impression Statement  Today's skilled session continued to focus on gait and stairs with prosthesis/cane support. Cues needed for posture, weight shifting and general safety with use of prosthesis/cane. The pt is progressing toward goals and should benefit from continued PT to progress toward unmet goals.     Rehab Potential  Good    Clinical Impairments Affecting Rehab Potential  transfemoral level amputation with multiple medical issues, significantly limited activity level for >2 years    PT Frequency  2x / week    PT Duration  12 weeks    PT Treatment/Interventions  ADLs/Self Care Home Management;Moist Heat;DME Instruction;Gait training;Stair training;Functional mobility training;Therapeutic activities;Therapeutic exercise;Balance training;Neuromuscular re-education;Patient/family education;Prosthetic Training;Manual techniques;Vestibular    PT Next Visit Plan  check STGs (past due to pt being on vacation), then continue towards LTGs    Consulted and Agree with Plan of Care  Patient       Patient will benefit from skilled therapeutic intervention in order to improve the following deficits and impairments:  Abnormal gait, Decreased activity tolerance, Decreased balance, Decreased endurance, Decreased knowledge of use of DME, Decreased mobility, Decreased strength, Decreased range of motion, Impaired flexibility, Prosthetic Dependency, Postural dysfunction  Visit Diagnosis: Other abnormalities of gait and mobility  Muscle weakness (generalized)  Abnormal  posture  Unsteadiness on feet     Problem List Patient Active Problem List   Diagnosis Date Noted  . Benign essential HTN   . Stage 5 chronic kidney disease not on chronic dialysis ([place])   . Above knee amputation status, left   . DM type 2 causing CKD stage 5 ([place]) 03/21/2017  . Anemia of chronic disease 02/05/2017  . Dysplasia of cervix, high grade CIN 2 01/12/2017  . Chronic diastolic CHF (congestive heart failure) ([place]) 11/08/2015  . Total knee replacement status   . Diabetic peripheral neuropathy ([place])   . Major depression, recurrent, chronic ([place])   . Chronic kidney disease (CKD), stage IV (severe) ([place])   . Hepatitis C 08/16/2014  . Peripheral neuropathy 06/24/2013  . Compulsive tobacco user syndrome 06/24/2013    Willow Ora, PTA, [facility] [address], [place], [place] [address] [phone] 03/16/18, 9:29 PM  Name: CHANCIE LAMPERT MRN: [id number] Date of Birth: Nov 11, 1955

## 2018-03-17 ENCOUNTER — Other Ambulatory Visit: Payer: Self-pay

## 2018-03-17 ENCOUNTER — Ambulatory Visit (INDEPENDENT_AMBULATORY_CARE_PROVIDER_SITE_OTHER): Payer: Self-pay | Admitting: Physician Assistant

## 2018-03-17 VITALS — BP 168/74 | HR 67 | Temp 97.4°F | Resp 20 | Ht 67.0 in | Wt 146.0 lb

## 2018-03-17 DIAGNOSIS — N185 Chronic kidney disease, stage 5: Secondary | ICD-10-CM

## 2018-03-17 NOTE — Progress Notes (Signed)
POST OPERATIVE OFFICE NOTE    CC:  F/u for surgery 02/01/2018  HPI:  This is a 62 y.o. female who is s/p Right second stage basilic vein transposition (brachiobasilic arteriovenous fistula) placement is here today for f/u.  She denise pain, loss of motor and numbness in the right UE.  She is not yet on HD.   Allergies  Allergen Reactions  . Compazine [Prochlorperazine] Shortness Of Breath, Swelling and Other (See Comments)    TONGUE SWELLS  . Shellfish-Derived Products Anaphylaxis  . Iodinated Diagnostic Agents Hives and Rash  . Omnipaque [Iohexol] Hives  . Sulfa Antibiotics Rash    Current Outpatient Medications  Medication Sig Dispense Refill  . amLODipine (NORVASC) 10 MG tablet Take 1 tablet (10 mg total) by mouth at bedtime. (Patient taking differently: Take 5 mg by mouth at bedtime. ) 30 tablet 0  . aspirin EC 81 MG tablet Take 81 mg by mouth daily.    [name] atorvastatin (LIPITOR) 10 MG tablet Take 1 tablet (10 mg total) by mouth at bedtime. 90 tablet 3  . Blood Glucose Monitoring Suppl (ACCU-CHEK AVIVA PLUS) W/DEVICE KIT Check sugars TID for E11.65 1 kit 0  . calcitRIOL (ROCALTROL) 0.25 MCG capsule Take 1 capsule (0.25 mcg total) by mouth every Monday, Wednesday, and Friday. (Patient taking differently: Take 0.25 mcg by mouth daily. ) 90 capsule 0  . calcium carbonate (TUMS EX) 750 MG chewable tablet Chew 1 tablet by mouth 3 (three) times daily as needed for heartburn.     . cloNIDine (CATAPRES) 0.2 MG tablet Take 0.2 mg by mouth 2 (two) times daily.    . ferrous sulfate 325 (65 FE) MG tablet Take 1 tablet (325 mg total) by mouth 2 (two) times daily with a meal. 60 tablet 3  . gabapentin (NEURONTIN) 300 MG capsule TAKE 1 CAPSULE BY MOUTH THREE TIMES DAILY 90 capsule 0  . glucose blood (ACCU-CHEK AVIVA) test strip Check sugars TID for E11.65 100 each 12  . hydrALAZINE (APRESOLINE) 25 MG tablet Take 4 tablets (100 mg total) by mouth 2 (two) times daily.    . Insulin Glargine (LANTUS  SOLOSTAR) 100 UNIT/ML Solostar Pen Inject 12 Units into the skin daily at 10 pm. (Patient taking differently: Inject 10 Units into the skin daily. ) 5 pen PRN  . Lancet Devices (ACCU-CHEK SOFTCLIX) lancets Check sugars TID for E11.65 1 each 10  . methocarbamol (ROBAXIN) 500 MG tablet Take 1 tablet (500 mg total) by mouth every 6 (six) hours as needed for muscle spasms. 30 tablet 3  . metolazone (ZAROXOLYN) 2.5 MG tablet Take 2.5 mg by mouth daily.  5  . metoprolol succinate (TOPROL-XL) 50 MG 24 hr tablet Take 1 tablet (50 mg total) by mouth 2 (two) times daily. 60 tablet 0  . Multiple Vitamin (MULTIVITAMIN WITH MINERALS) TABS tablet Take 1 tablet by mouth daily.    [name] omeprazole (PRILOSEC) 20 MG capsule Take 20 mg by mouth daily before breakfast.     . oxyCODONE-acetaminophen (PERCOCET) 5-325 MG tablet Take 1 tablet by mouth every 6 (six) hours as needed for up to 12 doses for severe pain. 12 tablet 0  . polyethylene glycol (MIRALAX / GLYCOLAX) packet Take 17 g by mouth 2 (two) times daily. (Patient taking differently: Take 17 g by mouth daily as needed for moderate constipation. ) 14 each 0  . sertraline (ZOLOFT) 100 MG tablet Take 1 tablet (100 mg total) by mouth daily. 30 tablet 6  . sodium bicarbonate  650 MG tablet Take 1,300 mg by mouth 3 (three) times daily.   3  . furosemide (LASIX) 80 MG tablet Take 1 tablet (80 mg total) by mouth 2 (two) times daily. 60 tablet 0   No current facility-administered medications for this visit.      ROS:  See HPI  Physical Exam:  Vitals:   03/17/18 1318  BP: (!) 168/74  Pulse: 67  Resp: 20  Temp: (!) 97.4 F (36.3 C)  SpO2: 92%    Incision:  Well healed Extremities:  Decreased sensation medial volar forearm, intact hand ulnar nerve distribution since first stage surgery right UE.  Right UE palpable thrill throughout the fistula.  Right hand sensation, motor and grip 5/5. Heart: RRR with known murmur Abdomen:  Soft NTTP Lungs + right UL rhonchi,  non labored breathing  Assessment/Plan:  This is a 62 y.o. female who is s/p:brachiobasilic arteriovenous fistula   She is not on HD at this time.  The right UE BB fistula is ready for cannulation at this time when she needs to start HD.  F/U PRN.     Roxy Horseman , PA-C [facility] [phone]

## 2018-03-18 ENCOUNTER — Ambulatory Visit: Payer: Medicare Other | Admitting: Physical Therapy

## 2018-03-18 ENCOUNTER — Encounter: Payer: Self-pay | Admitting: Physical Therapy

## 2018-03-18 DIAGNOSIS — R262 Difficulty in walking, not elsewhere classified: Secondary | ICD-10-CM

## 2018-03-18 DIAGNOSIS — R2681 Unsteadiness on feet: Secondary | ICD-10-CM | POA: Diagnosis not present

## 2018-03-18 DIAGNOSIS — R293 Abnormal posture: Secondary | ICD-10-CM | POA: Diagnosis not present

## 2018-03-18 DIAGNOSIS — R2689 Other abnormalities of gait and mobility: Secondary | ICD-10-CM

## 2018-03-18 DIAGNOSIS — M6281 Muscle weakness (generalized): Secondary | ICD-10-CM | POA: Diagnosis not present

## 2018-03-18 NOTE — Therapy (Signed)
[facility] [address] [place], [place], [address] Phone: [phone]   Fax:  [phone]  Physical Therapy Treatment  Patient Details  Name: Brandy Houston MRN: [id number] Date of Birth: 17-Jan-1956 Referring Provider (PT): Meridee Score, MD   Encounter Date: 03/18/2018  PT End of Session - 03/18/18 1029    Visit Number  29    Number of Visits  42    Date for PT Re-Evaluation  04/02/18    Authorization Type  Medicare & Medicaid    PT Start Time  1025   pt late due to transportation   PT Stop Time  1100    PT Time Calculation (min)  35 min    Equipment Utilized During Treatment  Gait belt    Activity Tolerance  Patient tolerated treatment well;Patient limited by fatigue    Behavior During Therapy  WFL for tasks assessed/performed       Past Medical History:  Diagnosis Date  . Anemia   . Anxiety   . Arthritis    "in my joints" (03/10/2017)  . Chronic diastolic CHF (congestive heart failure) ([place] [place])   . CKD (chronic kidney disease), stage IV ([place])    stage IV. previous HD, none currently 10/30/15 (confirmed 02/05/2017 & 03/10/2017)  . Depression    Chronic  . Diabetic peripheral neuropathy ([place])   . Dysrhythmia    tachycardia, normal ECHO 08-09-14  . GERD (gastroesophageal reflux disease)   . Heart murmur     dx'd 02/05/2017  . Hepatitis C    "tx'd in 2016; I'm negative now" (02/05/2017)  . History of blood transfusion 2017   "w/knee replacement"  . Hypertension   . Osteoarthritis of left knee   . Peripheral neuropathy   . Protein calorie malnutrition ([place])   . Septic arthritis ([place])   . Slow transit constipation   . Type II diabetes mellitus ([name])    IDDM  . Uncontrolled hypertension 02/18/2013  . Unsteady gait     Past Surgical History:  Procedure Laterality Date  . AMPUTATION Left 03/20/2017   Procedure: LEFT ABOVE KNEE AMPUTATION;  Surgeon: Newt Minion, MD;  Location: [facility];  Service: Orthopedics;   Laterality: Left;  . APPENDECTOMY    . AV FISTULA PLACEMENT Left 09/13/2014   Procedure: Brachial Artery to Brachial Vein Gortex Four - Seven Stretch GRAFT INSERTION Left Forearm;  Surgeon: Mal Misty, MD;  Location: [facility];  Service: Vascular;  Laterality: Left;  . BASCILIC VEIN TRANSPOSITION Right 10/26/2017   Procedure: BASILIC VEIN TRANSPOSITION FIRST STAGE RIGHT ARM;  Surgeon: Conrad Benson, MD;  Location: [facility];  Service: Vascular;  Laterality: Right;  . BASCILIC VEIN TRANSPOSITION Right 02/01/2018   Procedure: SECOND STAGE BASILIC VEIN TRANSPOSITION RIGHT UPPER EXTREMITY;  Surgeon: Marty Heck, MD;  Location: [facility];  Service: Vascular;  Laterality: Right;  . CHOLECYSTECTOMY OPEN    . COLON SURGERY    . EXCISIONAL TOTAL KNEE ARTHROPLASTY WITH ANTIBIOTIC SPACERS Left 02/06/2017   Procedure: Incisional total iknee with antibiotic spacer ;  Surgeon: Leandrew Koyanagi, MD;  Location: [facility];  Service: Orthopedics;  Laterality: Left;  . IR FLUORO GUIDE CV LINE RIGHT  02/10/2017  . IR REMOVAL TUN CV CATH W/O FL  04/01/2017  . IR US GUIDE VASC ACCESS RIGHT  02/10/2017  . IRRIGATION AND DEBRIDEMENT KNEE Left 03/12/2017   Procedure: IRRIGATION AND DEBRIDEMENT LEFT KNEE WITH WOUND VAC APPLICATION;  Surgeon: Leandrew Koyanagi, MD;  Location: [facility];  Service: Orthopedics;  Laterality: Left;  . JOINT REPLACEMENT    . KNEE ARTHROSCOPY Right 08/10/2014   Procedure: ARTHROSCOPY I & D KNEE;  Surgeon: Marianna Payment, MD;  Location: [facility];  Service: Orthopedics;  Laterality: Right;  . KNEE ARTHROSCOPY Left 08/11/2014   Procedure: ARTHROSCOPIC WASHOUT LEFT KNEE;  Surgeon: Marianna Payment, MD;  Location: [facility];  Service: Orthopedics;  Laterality: Left;  . KNEE ARTHROSCOPY Left 08/19/2014   Procedure: ARTHROSCOPIC WASHOUT LEFT KNEE;  Surgeon: Leandrew Koyanagi, MD;  Location: [facility];  Service: Orthopedics;  Laterality: Left;  . KNEE ARTHROSCOPY WITH LATERAL MENISECTOMY Left 04/04/2015   Procedure: AND PARTIAL  LATERAL MENISECTOMY;  Surgeon: Leandrew Koyanagi, MD;  Location: [facility];  Service: Orthopedics;  Laterality: Left;  . KNEE ARTHROSCOPY WITH MEDIAL MENISECTOMY Left 04/04/2015   Procedure: LEFT KNEE ARTHROSCOPY WITH PARTIAL MEDIAL MENISCECTOMY  AND SYNOVECTOMY;  Surgeon: Leandrew Koyanagi, MD;  Location: [facility];  Service: Orthopedics;  Laterality: Left;  . SHOULDER ARTHROSCOPY Bilateral 08/10/2014   Procedure: I & D BILATERAL SHOULDERS ;  Surgeon: Marianna Payment, MD;  Location: [facility];  Service: Orthopedics;  Laterality: Bilateral;  . SMALL INTESTINE SURGERY     Due to Small Bowel Obstruction; "fixed it when they did my gallbladder OR"  . TEE WITHOUT CARDIOVERSION N/A 08/14/2014   Procedure: TRANSESOPHAGEAL ECHOCARDIOGRAM (TEE);  Surgeon: Thayer Headings, MD;  Location: [facility];  Service: Cardiovascular;  Laterality: N/A;  . TENOSYNOVECTOMY Right 08/11/2014   Procedure: RIGHT WRIST IRRIGATION AND DEBRIDEMENT, TENOSYNOVECTOMY;  Surgeon: Marianna Payment, MD;  Location: [facility];  Service: Orthopedics;  Laterality: Right;  . TOTAL KNEE ARTHROPLASTY Left 11/07/2015   Procedure: LEFT TOTAL KNEE ARTHROPLASTY WITH REVISION OF IMPLANTS;  Surgeon: Leandrew Koyanagi, MD;  Location: [facility];  Service: Orthopedics;  Laterality: Left;  . TUBAL LIGATION      There were no vitals filed for this visit.  Subjective Assessment - 03/18/18 1028    Subjective  Reports feeling more tired today, "I think my iron is low". Has an appt on 03/24/18 for lab work to be done. Denies any pain or falls.     Pertinent History  left TFA, DM, neuropathy, Hep C, HTN, CHF, depression, CKD st 5 not dialysis, arthritis, heart murmur,     Limitations  Standing;Walking;House hold activities    Patient Stated Goals  To walk with prosthesis in home & community, travel plane, car & cruise in September    Currently in Pain?  No/denies    Pain Score  0-No pain            [facility] Adult PT Treatment/Exercise -  03/18/18 1030      Transfers   Transfers  Sit to Stand;Stand to Sit    Sit to Stand  5: Supervision;With upper extremity assist;With armrests;From chair/3-in-1    Sit to Stand Details  Verbal cues for sequencing;Verbal cues for technique;Verbal cues for safe use of DME/AE    Stand to Sit  5: Supervision;With upper extremity assist;With armrests;To chair/3-in-1    Stand to Sit Details (indicate cue type and reason)  Verbal cues for sequencing;Verbal cues for technique;Verbal cues for safe use of DME/AE    Stand Pivot Transfers  4: Min guard    Stand Pivot Transfer Details (indicate cue type and reason)  with 180 degree turns to sit on rollator with rest breaks. cues for sequencing and safety.  Ambulation/Gait   Ambulation/Gait  Yes    Ambulation/Gait Assistance  4: Min guard    Ambulation/Gait Assistance Details  pt needed cues on hand placement on rollator, proximity to rollator and for posture. decreased stability at times due to prosthetic foot placement with intital contact needed min assist for balance/stafety with gait.     Ambulation Distance (Feet)  100 Feet   x1, 500 x1   Assistive device  Rollator;Prosthesis    Gait Pattern  Step-through pattern;Decreased step length - right;Decreased step length - left;Decreased weight shift to left;Narrow base of support    Ambulation Surface  Level;Indoor    Gait Comments  pt needed 3 seated rest breaks due to fatigue with the 500 feet distance.       Therapeutic Activites    Therapeutic Activities  Other Therapeutic Activities;Lifting    Lifting  PTA demo'd technique with TFA prosthesis. pt needed min assist with cues to prevent fall with picking up 3# hand weight.     Other Therapeutic Activities  standing without support: pt can move her head side to side with supervision, if turning to look further back she needs up to min assist to maintain balance.      Prosthetics   Current prosthetic wear tolerance (days/week)   reports every  other day, most all Saturdays.     Current prosthetic wear tolerance (#hours/day)   reports 4-8 hours a day.    Residual limb condition   intact per pt report    Donning Prosthesis  Modified independent (device/increased time)    Doffing Prosthesis  Modified independent (device/increased time)               PT Short Term Goals - 03/18/18 1030      PT SHORT TERM GOAL #1   Title  Patient standing balance without UE support head turns to scan with loss of balance. (All STGs Target Date: 03/12/2018)    Baseline  03/18/18: can move head side to side with supervision, if turning to look further than that needed min assist for balance    Time  --    Period  --    Status  Partially Met      PT SHORT TERM GOAL #2   Title  patient reports prosthesis wear daily >/= 12 hrs/day    Baseline  03/16/18: met most days, did take a day off after her cruise    Status  Partially Met      PT [place] #3   Title  Patient picks up 3# objects from floor with cane support with prosthesis with supervision.     Baseline  03/18/18: needed demo prior to performance, cues during and min assist to prevent fall    Time  --    Period  --    Status  Not Met      PT SHORT TERM GOAL #4   Title  Patient ambulates 500' with rollator walker & prosthesis with supervision.     Baseline  03/18/18: pt made th 500 feet with rollator, however needed 3 seated rest breaks and min guard to min assist for safety    Time  --    Status  Partially Met      PT SHORT TERM GOAL #5   Title  Patient ambulates 38' with single point cane quad tip & prosthesis with minA.     Baseline  03/18/18: max distance to date has been 20 feet with min to mod assist.  Status  Not Met        PT Long Term Goals - 02/23/18 1709      PT LONG TERM GOAL #1   Title  Patient verbalizes & demonstrates proper prosthetic care to enable safe use of prosthesis. (All LTGs Target Date: 04/01/2018)    Time  3    Period  Months    Status   On-going    Target Date  04/01/18      PT LONG TERM GOAL #2   Title  Patient tolerates & reports prosthesis wear daily >75% of awake hours without skin issues or limb pain.     Time  3    Period  Months    Status  On-going    Target Date  04/01/18      PT LONG TERM GOAL #3   Title  Patient ambulates 500' outdoors including grass with LRAD & prosthesis modified independent for community mobility.     Time  3    Period  Months    Status  On-going    Target Date  04/01/18      PT LONG TERM GOAL #4   Title  Patient negotiates ramps, curbs & stairs with LRAD & prosthesis modified independent to enable community mobility.     Time  3    Period  Months    Status  On-going    Target Date  04/01/18      PT LONG TERM GOAL #5   Title  Task of Berg Balance Test with walker support >45/56    Time  3    Period  Months    Status  On-going    Target Date  04/01/18            Plan - 03/18/18 1029    Clinical Impression Statement  Today's skilled session focused on progress toward STGs with 3 goals partially met, other's not met. The pt was limited by fatigue today needing increased assistance for balance and increased rest breaks. The pt should benefit from continued PT to progress toward unmet STGs/LTGs.     Rehab Potential  Good    Clinical Impairments Affecting Rehab Potential  transfemoral level amputation with multiple medical issues, significantly limited activity level for >2 years    PT Frequency  2x / week    PT Duration  12 weeks    PT Treatment/Interventions  ADLs/Self Care Home Management;Moist Heat;DME Instruction;Gait training;Stair training;Functional mobility training;Therapeutic activities;Therapeutic exercise;Balance training;Neuromuscular re-education;Patient/family education;Prosthetic Training;Manual techniques;Vestibular    PT Next Visit Plan  , then continue towards LTGs    Consulted and Agree with Plan of Care  Patient       Patient will benefit from skilled  therapeutic intervention in order to improve the following deficits and impairments:  Abnormal gait, Decreased activity tolerance, Decreased balance, Decreased endurance, Decreased knowledge of use of DME, Decreased mobility, Decreased strength, Decreased range of motion, Impaired flexibility, Prosthetic Dependency, Postural dysfunction  Visit Diagnosis: Other abnormalities of gait and mobility  Muscle weakness (generalized)  Abnormal posture  Unsteadiness on feet  Difficulty in walking, not elsewhere classified     Problem List Patient Active Problem List   Diagnosis Date Noted  . Benign essential HTN   . Stage 5 chronic kidney disease not on chronic dialysis ([place])   . Above knee amputation status, left   . DM type 2 causing CKD stage 5 ([facility]) 03/21/2017  . Anemia of chronic disease 02/05/2017  . Dysplasia of cervix, high  grade CIN 2 01/12/2017  . Chronic diastolic CHF (congestive heart failure) ([place]) 11/08/2015  . Total knee replacement status   . Diabetic peripheral neuropathy ([place])   . Major depression, recurrent, chronic ([place])   . Chronic kidney disease (CKD), stage IV (severe) ([facility])   . Hepatitis C 08/16/2014  . Peripheral neuropathy 06/24/2013  . Compulsive tobacco user syndrome 06/24/2013    Willow Ora, PTA, [facility] [address] [place],  [address] [phone] 03/19/18, 10:52 AM   Name: NATARSHA HURWITZ MRN: [id number] Date of Birth: Oct 22, 1955

## 2018-03-22 ENCOUNTER — Other Ambulatory Visit (HOSPITAL_COMMUNITY): Payer: Self-pay

## 2018-03-23 ENCOUNTER — Encounter: Payer: Self-pay | Admitting: Physical Therapy

## 2018-03-23 ENCOUNTER — Telehealth: Payer: Self-pay | Admitting: Family Medicine

## 2018-03-23 ENCOUNTER — Ambulatory Visit: Payer: Medicare Other | Admitting: Physical Therapy

## 2018-03-23 DIAGNOSIS — M6281 Muscle weakness (generalized): Secondary | ICD-10-CM | POA: Diagnosis not present

## 2018-03-23 DIAGNOSIS — R262 Difficulty in walking, not elsewhere classified: Secondary | ICD-10-CM | POA: Diagnosis not present

## 2018-03-23 DIAGNOSIS — R2681 Unsteadiness on feet: Secondary | ICD-10-CM | POA: Diagnosis not present

## 2018-03-23 DIAGNOSIS — R293 Abnormal posture: Secondary | ICD-10-CM

## 2018-03-23 DIAGNOSIS — R2689 Other abnormalities of gait and mobility: Secondary | ICD-10-CM

## 2018-03-23 NOTE — Therapy (Signed)
[facility] [address] [place], [place], [address] Phone: [phone]   Fax:  [phone]  Physical Therapy Treatment  Patient Details  Name: Brandy Houston MRN: [id number] Date of Birth: Oct 25, 1955 Referring Provider (PT): Meridee Score, MD   Encounter Date: 03/23/2018  PT End of Session - 03/23/18 1024    Visit Number  30    Number of Visits  42    Date for PT Re-Evaluation  04/02/18    Authorization Type  Medicare & Medicaid    PT Start Time  1019    PT Stop Time  1100    PT Time Calculation (min)  41 min    Equipment Utilized During Treatment  Gait belt    Activity Tolerance  Patient tolerated treatment well;Patient limited by fatigue    Behavior During Therapy  [facility] for tasks assessed/performed       Past Medical History:  Diagnosis Date  . Anemia   . Anxiety   . Arthritis    "in my joints" (03/10/2017)  . Chronic diastolic CHF (congestive heart failure) ([place])   . CKD (chronic kidney disease), stage IV ([place])    stage IV. previous HD, none currently 10/30/15 (confirmed 02/05/2017 & 03/10/2017)  . Depression    Chronic  . Diabetic peripheral neuropathy ([place])   . Dysrhythmia    tachycardia, normal ECHO 08-09-14  . GERD (gastroesophageal reflux disease)   . Heart murmur     dx'd 02/05/2017  . Hepatitis C    "tx'd in 2016; I'm negative now" (02/05/2017)  . History of blood transfusion 2017   "w/knee replacement"  . Hypertension   . Osteoarthritis of left knee   . Peripheral neuropathy   . Protein calorie malnutrition ([place])   . Septic arthritis ([place])   . Slow transit constipation   . Type II diabetes mellitus ([name])    IDDM  . Uncontrolled hypertension 02/18/2013  . Unsteady gait     Past Surgical History:  Procedure Laterality Date  . AMPUTATION Left 03/20/2017   Procedure: LEFT ABOVE KNEE AMPUTATION;  Surgeon: Newt Minion, MD;  Location: [facility];  Service: Orthopedics;  Laterality: Left;  . APPENDECTOMY     . AV FISTULA PLACEMENT Left 09/13/2014   Procedure: Brachial Artery to Brachial Vein Gortex Four - Seven Stretch GRAFT INSERTION Left Forearm;  Surgeon: Mal Misty, MD;  Location: [facility];  Service: Vascular;  Laterality: Left;  . BASCILIC VEIN TRANSPOSITION Right 10/26/2017   Procedure: BASILIC VEIN TRANSPOSITION FIRST STAGE RIGHT ARM;  Surgeon: Conrad Brookville, MD;  Location: [facility];  Service: Vascular;  Laterality: Right;  . BASCILIC VEIN TRANSPOSITION Right 02/01/2018   Procedure: SECOND STAGE BASILIC VEIN TRANSPOSITION RIGHT UPPER EXTREMITY;  Surgeon: Marty Heck, MD;  Location: [facility];  Service: Vascular;  Laterality: Right;  . CHOLECYSTECTOMY OPEN    . COLON SURGERY    . EXCISIONAL TOTAL KNEE ARTHROPLASTY WITH ANTIBIOTIC SPACERS Left 02/06/2017   Procedure: Incisional total iknee with antibiotic spacer ;  Surgeon: Leandrew Koyanagi, MD;  Location: [facility];  Service: Orthopedics;  Laterality: Left;  . IR FLUORO GUIDE CV LINE RIGHT  02/10/2017  . IR REMOVAL TUN CV CATH W/O FL  04/01/2017  . IR US GUIDE VASC ACCESS RIGHT  02/10/2017  . IRRIGATION AND DEBRIDEMENT KNEE Left 03/12/2017   Procedure: IRRIGATION AND DEBRIDEMENT LEFT KNEE WITH WOUND VAC APPLICATION;  Surgeon: Leandrew Koyanagi, MD;  Location: [facility];  Service: Orthopedics;  Laterality: Left;  . JOINT REPLACEMENT    . KNEE ARTHROSCOPY Right 08/10/2014   Procedure: ARTHROSCOPY I & D KNEE;  Surgeon: Marianna Payment, MD;  Location: [facility];  Service: Orthopedics;  Laterality: Right;  . KNEE ARTHROSCOPY Left 08/11/2014   Procedure: ARTHROSCOPIC WASHOUT LEFT KNEE;  Surgeon: Marianna Payment, MD;  Location: [facility];  Service: Orthopedics;  Laterality: Left;  . KNEE ARTHROSCOPY Left 08/19/2014   Procedure: ARTHROSCOPIC WASHOUT LEFT KNEE;  Surgeon: Leandrew Koyanagi, MD;  Location: [facility];  Service: Orthopedics;  Laterality: Left;  . KNEE ARTHROSCOPY WITH LATERAL MENISECTOMY Left 04/04/2015   Procedure: AND PARTIAL LATERAL MENISECTOMY;  Surgeon: Leandrew Koyanagi, MD;  Location: [facility];  Service: Orthopedics;  Laterality: Left;  . KNEE ARTHROSCOPY WITH MEDIAL MENISECTOMY Left 04/04/2015   Procedure: LEFT KNEE ARTHROSCOPY WITH PARTIAL MEDIAL MENISCECTOMY  AND SYNOVECTOMY;  Surgeon: Leandrew Koyanagi, MD;  Location: [facility];  Service: Orthopedics;  Laterality: Left;  . SHOULDER ARTHROSCOPY Bilateral 08/10/2014   Procedure: I & D BILATERAL SHOULDERS ;  Surgeon: Marianna Payment, MD;  Location: [facility];  Service: Orthopedics;  Laterality: Bilateral;  . SMALL INTESTINE SURGERY     Due to Small Bowel Obstruction; "fixed it when they did my gallbladder OR"  . TEE WITHOUT CARDIOVERSION N/A 08/14/2014   Procedure: TRANSESOPHAGEAL ECHOCARDIOGRAM (TEE);  Surgeon: Thayer Headings, MD;  Location: [facility];  Service: Cardiovascular;  Laterality: N/A;  . TENOSYNOVECTOMY Right 08/11/2014   Procedure: RIGHT WRIST IRRIGATION AND DEBRIDEMENT, TENOSYNOVECTOMY;  Surgeon: Marianna Payment, MD;  Location: [facility];  Service: Orthopedics;  Laterality: Right;  . TOTAL KNEE ARTHROPLASTY Left 11/07/2015   Procedure: LEFT TOTAL KNEE ARTHROPLASTY WITH REVISION OF IMPLANTS;  Surgeon: Leandrew Koyanagi, MD;  Location: [facility];  Service: Orthopedics;  Laterality: Left;  . TUBAL LIGATION      There were no vitals filed for this visit.  Subjective Assessment - 03/23/18 1023    Subjective  She went on cruise with family and used electric scooter to get around boat. No falls. She has appt. with nephrologist.     Pertinent History  left TFA, DM, neuropathy, Hep C, HTN, CHF, depression, CKD st 5 not dialysis, arthritis, heart murmur,     Limitations  Standing;Walking;House hold activities    Patient Stated Goals  To walk with prosthesis in home & community, travel plane, car & cruise in September    Currently in Pain?  No/denies         [facility] PT Assessment - 03/23/18 1015      Transfers   Transfers  Sit to Stand;Stand to Sit    Sit to Stand  6:  Modified independent (Device/Increase time);With upper extremity assist;With armrests;From chair/3-in-1;Other (comment)   to rollator walker   Stand to Sit  6: Modified independent (Device/Increase time);With upper extremity assist;To elevated surface;To chair/3-in-1;Other (comment)   from rollator walker   Stand Pivot Transfers  6: Modified independent (Device/Increase time)   180* turn to/from rollator seat      Ambulation/Gait   Ambulation/Gait  Yes    Ambulation/Gait Assistance  6: Modified independent (Device/Increase time)    Ambulation Distance (Feet)  525 Feet   525' total with one seated rest for 2 min on rollator   Assistive device  Rollator;Prosthesis    Gait Pattern  Step-through pattern;Decreased stance time - left;Antalgic;Trunk flexed    Ambulation Surface  Level;Indoor;Outdoor;Grass    Gait velocity  0.98  ft/sec with rollator walker   initial eval was 0.35 ft/sec with RW     Berg Balance Test   Sit to Stand  Able to stand  independently using hands   with RW support = 3   Standing Unsupported  Able to stand 2 minutes with supervision   with RW support = 4   Sitting with Back Unsupported but Feet Supported on Floor or Stool  Able to sit safely and securely 2 minutes    Stand to Sit  Controls descent by using hands   with RW support = 3   Transfers  Able to transfer safely, minor use of hands    Standing Unsupported with Eyes Closed  Unable to keep eyes closed 3 seconds but stays steady   with RW support = 4   Standing Ubsupported with Feet Together  Needs help to attain position but able to stand for 30 seconds with feet together   with RW support = 4   From Standing, Reach Forward with Outstretched Arm  Can reach forward >5 cm safely (2")   with RW support = 4   From Standing Position, Pick up Object from Floor  Unable to pick up and needs supervision   with RW support = 4   From Standing Position, Turn to Look Behind Over each Shoulder  Turn sideways only but  maintains balance   with RW support = 4   Turn 360 Degrees  Needs assistance while turning   with RW support = 2   Standing Unsupported, Alternately Place Feet on Step/Stool  Needs assistance to keep from falling or unable to try   with RW support = 4   Standing Unsupported, One Foot in [employer] balance while stepping or standing   with RW support = 3   Standing on One Leg  Tries to lift leg/unable to hold 3 seconds but remains standing independently   with RW support = 4   Total Score  25    Berg comment:  10/8 w/RW 51/56  on 7/18 Berg was 10/56 & tasks with RW 36/56      Prosthetics Assessment - 03/23/18 [place] with  Skin check;Residual limb care;Care of non-amputated limb;Prosthetic cleaning;Ply sock cleaning;Correct ply sock adjustment;Proper wear schedule/adjustment;Proper weight-bearing schedule/adjustment    Prosthetic Care Comments   PT educated pt on need for socket revision with limb volume shrinkage & socket is now too large causing stability/balance issues. She needs to coordinate with prosthetist & MD to get prescription & justification for socket revision.     Donning prosthesis   Modified independent (Device/Increase time)    Doffing prosthesis   Modified independent (Device/Increase time)    Current prosthetic wear tolerance (days/week)   daily    Current prosthetic wear tolerance (#hours/day)   >80% of awake hours    Residual limb condition   intact per pt report                            PT Short Term Goals - 03/18/18 1030      PT SHORT TERM GOAL #1   Title  Patient standing balance without UE support head turns to scan with loss of balance. (All STGs Target Date: 03/12/2018)    Baseline  03/18/18: can move head side to side with supervision, if turning to look further than that needed min assist for balance  Time  --    Period  --    Status  Partially Met      PT SHORT TERM GOAL #2   Title   patient reports prosthesis wear daily >/= 12 hrs/day    Baseline  03/16/18: met most days, did take a day off after her cruise    Status  Partially [place] #3   Title  Patient picks up 3# objects from floor with cane support with prosthesis with supervision.     Baseline  03/18/18: needed demo prior to performance, cues during and min assist to prevent fall    Time  --    Period  --    Status  Not Met      PT SHORT TERM GOAL #4   Title  Patient ambulates 500' with rollator walker & prosthesis with supervision.     Baseline  03/18/18: pt made th 500 feet with rollator, however needed 3 seated rest breaks and min guard to min assist for safety    Time  --    Status  Partially Met      PT SHORT TERM GOAL #5   Title  Patient ambulates 49' with single point cane quad tip & prosthesis with minA.     Baseline  03/18/18: max distance to date has been 20 feet with min to mod assist.     Status  Not Met        PT Long Term Goals - 03/23/18 1201      PT LONG TERM GOAL #1   Title  Patient verbalizes & demonstrates proper prosthetic care to enable safe use of prosthesis. (All LTGs Target Date: 04/01/2018)    Baseline  MET 03/23/2018    Time  3    Period  Months    Status  Achieved      PT LONG TERM GOAL #2   Title  Patient tolerates & reports prosthesis wear daily >75% of awake hours without skin issues or limb pain.     Baseline  MET 03/23/2018    Time  3    Period  Months    Status  Achieved      PT LONG TERM GOAL #3   Title  Patient ambulates 500' outdoors including grass with LRAD & prosthesis modified independent for community mobility.     Baseline  Partially MET 03/23/2018  Pt ambulates 500' with Rollator walker & prosthesis with one seated rest break on rollator seat modified independent.     Time  3    Period  Months    Status  Partially Met      PT LONG TERM GOAL #4   Title  Patient negotiates ramps, curbs & stairs with LRAD & prosthesis modified independent  to enable community mobility.     Time  3    Period  Months    Status  On-going      PT LONG TERM GOAL #5   Title  Task of Berg Balance Test with walker support >45/56    Baseline  MET 03/23/2018  Pt standing balance improved to 51/56 with rollator support.    Time  3    Period  Months    Status  Achieved            Plan - 03/23/18 1206    Clinical Impression Statement  Patient met 4 of 5 LTG today with remaining to be checked as last appointment.  Patient has improved function with prosthesis & rollator at community level. Her socket appears too large now with typical limb shrinkage in first 6 months of prosthesis use and would benefit from a socket revision. Patient may benefit from future PT after she gets new socket and has time to build her strength & endurance.     PT Next Visit Plan  assess remaining LTG, address ongoing exercises & fitness plan. Discharge after next visit.     Consulted and Agree with Plan of Care  Patient       Patient will benefit from skilled therapeutic intervention in order to improve the following deficits and impairments:     Visit Diagnosis: Other abnormalities of gait and mobility  Muscle weakness (generalized)  Abnormal posture  Unsteadiness on feet     Problem List Patient Active Problem List   Diagnosis Date Noted  . Benign essential HTN   . Stage 5 chronic kidney disease not on chronic dialysis ([place])   . Above knee amputation status, left   . DM type 2 causing CKD stage 5 ([place]) 03/21/2017  . Anemia of chronic disease 02/05/2017  . Dysplasia of cervix, high grade CIN 2 01/12/2017  . Chronic diastolic CHF (congestive heart failure) ([place]) 11/08/2015  . Total knee replacement status   . Diabetic peripheral neuropathy ([place])   . Major depression, recurrent, chronic ([place])   . Chronic kidney disease (CKD), stage IV (severe) ([place])   . Hepatitis C 08/16/2014  . Peripheral neuropathy 06/24/2013  . Compulsive tobacco user syndrome  06/24/2013    Jamey Reas PT, DPT 03/23/2018, 12:26 PM  [facility] [address] [place], [place], [address] Phone: [phone]   Fax:  [phone]  Name: ADANNA ZUCKERMAN MRN: [id number] Date of Birth: 27-Nov-1955

## 2018-03-23 NOTE — Telephone Encounter (Signed)
Copied from [facility] [id number]. Topic: Quick Communication - See Telephone Encounter >> Mar 23, 2018  9:40 AM Rutherford Nail, NT wrote: CRM for notification. See Telephone encounter for: 03/23/18. Chanel with [employer] calling to see if the office received a fax including a certificate of medical necessity and physician's order for incontinent supplies. CB#: [phone]

## 2018-03-23 NOTE — Telephone Encounter (Signed)
Received [employer] forms via fax to be completed. Placed in bin up front w/charge sheet

## 2018-03-23 NOTE — Telephone Encounter (Signed)
Spoke with Jenny Reichmann, we have not received documentation they will resend the documentation back over to our fax.   Doloris Hall,  LPN

## 2018-03-24 ENCOUNTER — Encounter (HOSPITAL_COMMUNITY): Payer: Medicare Other

## 2018-03-24 ENCOUNTER — Ambulatory Visit (HOSPITAL_COMMUNITY)
Admission: RE | Admit: 2018-03-24 | Discharge: 2018-03-24 | Disposition: A | Discharge status: Home / Self Care | Payer: Medicare Other | Source: Ambulatory Visit | Attending: Nephrology | Admitting: Nephrology

## 2018-03-24 VITALS — BP 146/61 | HR 54 | Temp 98.6°F | Resp 20 | Ht 67.0 in | Wt 146.0 lb

## 2018-03-24 DIAGNOSIS — D631 Anemia in chronic kidney disease: Secondary | ICD-10-CM | POA: Insufficient documentation

## 2018-03-24 DIAGNOSIS — N184 Chronic kidney disease, stage 4 (severe): Secondary | ICD-10-CM | POA: Diagnosis not present

## 2018-03-24 LAB — POCT HEMOGLOBIN-HEMACUE: Hemoglobin: 8.6 g/dL — ABNORMAL LOW (ref 12.0–15.0)

## 2018-03-24 MED ORDER — DARBEPOETIN ALFA 200 MCG/0.4ML IJ SOSY
200.0000 ug | PREFILLED_SYRINGE | INTRAMUSCULAR | Status: DC
  Administered 2018-03-24: 200 ug via SUBCUTANEOUS

## 2018-03-24 MED ORDER — DARBEPOETIN ALFA 200 MCG/0.4ML IJ SOSY
PREFILLED_SYRINGE | INTRAMUSCULAR | Status: AC
  Filled 2018-03-24: qty 0.4

## 2018-03-24 MED ORDER — SODIUM CHLORIDE 0.9 % IV SOLN
510.0000 mg | Freq: Once | INTRAVENOUS | Status: AC
  Administered 2018-03-24: 510 mg via INTRAVENOUS
  Filled 2018-03-24: qty 17

## 2018-03-25 ENCOUNTER — Encounter: Payer: Self-pay | Admitting: Physical Therapy

## 2018-03-25 ENCOUNTER — Ambulatory Visit: Payer: Medicare Other | Admitting: Physical Therapy

## 2018-03-25 VITALS — BP 174/74

## 2018-03-25 DIAGNOSIS — R262 Difficulty in walking, not elsewhere classified: Secondary | ICD-10-CM

## 2018-03-25 DIAGNOSIS — R293 Abnormal posture: Secondary | ICD-10-CM | POA: Diagnosis not present

## 2018-03-25 DIAGNOSIS — R2681 Unsteadiness on feet: Secondary | ICD-10-CM

## 2018-03-25 DIAGNOSIS — R2689 Other abnormalities of gait and mobility: Secondary | ICD-10-CM

## 2018-03-25 DIAGNOSIS — M6281 Muscle weakness (generalized): Secondary | ICD-10-CM

## 2018-03-25 NOTE — Therapy (Addendum)
[facility] [address] [place], [place], [address] Phone: [phone]   Fax:  [phone]   PHYSICAL THERAPY DISCHARGE SUMMARY  Visits from Start of Care: 31  Current functional level related to goals / functional outcomes: See below   Remaining deficits: See below   Education / Equipment: Prosthetic care & HEP.  Patient needs a socket revision. She would benefit from additional PT after she receives new socket with updated suspension.    Plan: Patient agrees to discharge.  Patient goals were partially met. Patient is being discharged due to meeting the stated rehab goals.  ?????   Jamey Reas, PT, DPT PT Specializing in [place] 04/12/18 7:55 AM Phone:  [phone]  Fax:  [phone] [facility] [address] [place], [place] [address]                   Physical Therapy Treatment  Patient Details  Name: Brandy Houston MRN: [id number] Date of Birth: 1956-04-13 Referring Provider (PT): Meridee Score, MD   Encounter Date: 03/25/2018  PT End of Session - 03/25/18 1111    Visit Number  31    Number of Visits  42    Date for PT Re-Evaluation  04/02/18    Authorization Type  Medicare & Medicaid    PT Start Time  1020    PT Stop Time  1100    PT Time Calculation (min)  40 min    Equipment Utilized During Treatment  Gait belt    Activity Tolerance  Patient tolerated treatment well;Patient limited by fatigue    Behavior During Therapy  WFL for tasks assessed/performed       Past Medical History:  Diagnosis Date  . Anemia   . Anxiety   . Arthritis    "in my joints" (03/10/2017)  . Chronic diastolic CHF (congestive heart failure) ([place])   . CKD (chronic kidney disease), stage IV ([place])    stage IV. previous HD, none currently 10/30/15 (confirmed 02/05/2017 & 03/10/2017)  . Depression    Chronic  . Diabetic peripheral neuropathy ([place])   . Dysrhythmia     tachycardia, normal ECHO 08-09-14  . GERD (gastroesophageal reflux disease)   . Heart murmur     dx'd 02/05/2017  . Hepatitis C    "tx'd in 2016; I'm negative now" (02/05/2017)  . History of blood transfusion 2017   "w/knee replacement"  . Hypertension   . Osteoarthritis of left knee   . Peripheral neuropathy   . Protein calorie malnutrition ([place])   . Septic arthritis ([place])   . Slow transit constipation   . Type II diabetes mellitus ([name])    IDDM  . Uncontrolled hypertension 02/18/2013  . Unsteady gait     Past Surgical History:  Procedure Laterality Date  . AMPUTATION Left 03/20/2017   Procedure: LEFT ABOVE KNEE AMPUTATION;  Surgeon: Newt Minion, MD;  Location: [facility];  Service: Orthopedics;  Laterality: Left;  . APPENDECTOMY    . AV FISTULA PLACEMENT Left 09/13/2014   Procedure: Brachial Artery to Brachial Vein Gortex Four - Seven Stretch GRAFT INSERTION Left Forearm;  Surgeon: Mal Misty, MD;  Location: [facility];  Service: Vascular;  Laterality: Left;  . BASCILIC VEIN TRANSPOSITION Right 10/26/2017   Procedure: BASILIC VEIN TRANSPOSITION FIRST STAGE RIGHT ARM;  Surgeon: Conrad , MD;  Location: [facility];  Service: Vascular;  Laterality: Right;  . BASCILIC VEIN TRANSPOSITION Right 02/01/2018  Procedure: SECOND STAGE BASILIC VEIN TRANSPOSITION RIGHT UPPER EXTREMITY;  Surgeon: Marty Heck, MD;  Location: [facility];  Service: Vascular;  Laterality: Right;  . CHOLECYSTECTOMY OPEN    . COLON SURGERY    . EXCISIONAL TOTAL KNEE ARTHROPLASTY WITH ANTIBIOTIC SPACERS Left 02/06/2017   Procedure: Incisional total iknee with antibiotic spacer ;  Surgeon: Leandrew Koyanagi, MD;  Location: [facility];  Service: Orthopedics;  Laterality: Left;  . IR FLUORO GUIDE CV LINE RIGHT  02/10/2017  . IR REMOVAL TUN CV CATH W/O FL  04/01/2017  . IR US GUIDE VASC ACCESS RIGHT  02/10/2017  . IRRIGATION AND DEBRIDEMENT KNEE Left 03/12/2017   Procedure: IRRIGATION AND DEBRIDEMENT LEFT KNEE WITH WOUND VAC  APPLICATION;  Surgeon: Leandrew Koyanagi, MD;  Location: [facility];  Service: Orthopedics;  Laterality: Left;  . JOINT REPLACEMENT    . KNEE ARTHROSCOPY Right 08/10/2014   Procedure: ARTHROSCOPY I & D KNEE;  Surgeon: Marianna Payment, MD;  [facility];  Service: Orthopedics;  Laterality: Right;  . KNEE ARTHROSCOPY Left 08/11/2014   Procedure: ARTHROSCOPIC WASHOUT LEFT KNEE;  Surgeon: Marianna Payment, MD;  Location: [facility];  Service: Orthopedics;  Laterality: Left;  . KNEE ARTHROSCOPY Left 08/19/2014   Procedure: ARTHROSCOPIC WASHOUT LEFT KNEE;  Surgeon: Leandrew Koyanagi, MD;  Location: [facility];  Service: Orthopedics;  Laterality: Left;  . KNEE ARTHROSCOPY WITH LATERAL MENISECTOMY Left 04/04/2015   Procedure: AND PARTIAL LATERAL MENISECTOMY;  Surgeon: Leandrew Koyanagi, MD;  Location: [facility];  Service: Orthopedics;  Laterality: Left;  . KNEE ARTHROSCOPY WITH MEDIAL MENISECTOMY Left 04/04/2015   Procedure: LEFT KNEE ARTHROSCOPY WITH PARTIAL MEDIAL MENISCECTOMY  AND SYNOVECTOMY;  Surgeon: Leandrew Koyanagi, MD;  Location: [facility];  Service: Orthopedics;  Laterality: Left;  . SHOULDER ARTHROSCOPY Bilateral 08/10/2014   Procedure: I & D BILATERAL SHOULDERS ;  Surgeon: Marianna Payment, MD;  [facility];  Service: Orthopedics;  Laterality: Bilateral;  . SMALL INTESTINE SURGERY     Due to Small Bowel Obstruction; "fixed it when they did my gallbladder OR"  . TEE WITHOUT CARDIOVERSION N/A 08/14/2014   Procedure: TRANSESOPHAGEAL ECHOCARDIOGRAM (TEE);  Surgeon: Thayer Headings, MD;  Location: [facility];  Service: Cardiovascular;  Laterality: N/A;  . TENOSYNOVECTOMY Right 08/11/2014   Procedure: RIGHT WRIST IRRIGATION AND DEBRIDEMENT, TENOSYNOVECTOMY;  Surgeon: Marianna Payment, MD;  Location: [facility];  Service: Orthopedics;  Laterality: Right;  . TOTAL KNEE ARTHROPLASTY Left 11/07/2015   Procedure: LEFT TOTAL KNEE ARTHROPLASTY WITH REVISION OF IMPLANTS;  Surgeon: Leandrew Koyanagi, MD;   Location: [facility];  Service: Orthopedics;  Laterality: Left;  . TUBAL LIGATION      Vitals:   03/25/18 1022  BP: (!) 174/74    Subjective Assessment - 03/25/18 1022    Subjective  No falls; nothing new to report. Pt plans to call prosthetist today for an appointment. Pt has sinus headache this morning.    Pertinent History  left TFA, DM, neuropathy, Hep C, HTN, CHF, depression, CKD st 5 not dialysis, arthritis, heart murmur,     Limitations  Standing;Walking;House hold activities    Patient Stated Goals  To walk with prosthesis in home & community, travel plane, car & cruise in September    Currently in Pain?  Yes    Pain Score  3     Pain Location  Head    Pain Orientation  Right    Pain Descriptors / Indicators  Aching    Pain Onset  Today    Pain Frequency  Constant                       [facility] Adult PT Treatment/Exercise - 03/25/18 0001      Transfers   Transfers  Sit to Stand;Stand to Sit    Sit to Stand  6: Modified independent (Device/Increase time);With upper extremity assist;With armrests;From chair/3-in-1;Other (comment)    Stand to Sit  6: Modified independent (Device/Increase time);With upper extremity assist;To elevated surface;To chair/3-in-1;Other (comment)    Stand Pivot Transfers  6: Modified independent (Device/Increase time)      Ambulation/Gait   Ambulation/Gait  Yes    Ambulation/Gait Assistance  6: Modified independent (Device/Increase time)    Ambulation/Gait Assistance Details  Instructing pt to work on proper distance between feet; pt starting to scissor.    Assistive device  Rollator;Prosthesis    Gait Pattern  Step-through pattern;Decreased stance time - left;Antalgic;Trunk flexed    Ambulation Surface  Level;Indoor    Stairs  Yes    Stairs Assistance  7: Independent    Stairs Assistance Details (indicate cue type and reason)  forward with 1 rail and cane and sidestepping with 1 rail, step to pattern with each.    Stair Management  Technique  One rail Left;Step to pattern;Forwards;Sideways;With cane    Number of Stairs  4   x2   Ramp  6: Modified independent (Device)    Ramp Details (indicate cue type and reason)  with rollator; demonstrated proper, safe technique    Curb  6: Modified independent (Device/increase time)    Curb Details (indicate cue type and reason)  with rollator; demonstrated proper, safe technique      Prosthetics   Prosthetic Care Comments   PTA reviewed education PT gave pt last visit on need for socket revision with limb volume shrinkage & socket is now too large causing stability/balance issues. She needs to coordinate with prosthetist & MD to get prescription & justification for socket revision.     Current prosthetic wear tolerance (days/week)   daily    Current prosthetic wear tolerance (#hours/day)   >80% of awake hours    Residual limb condition   intact per pt report             PT Education - 03/25/18 1115    Education provided  Yes    Education Details  Continuing fitness options.    Person(s) Educated  Patient    Methods  Explanation;Handout    Comprehension  Verbalized understanding       PT Short Term Goals - 03/18/18 1030      PT SHORT TERM GOAL #1   Title  Patient standing balance without UE support head turns to scan with loss of balance. (All STGs Target Date: 03/12/2018)    Baseline  03/18/18: can move head side to side with supervision, if turning to look further than that needed min assist for balance    Time  --    Period  --    Status  Partially Met      PT SHORT TERM GOAL #2   Title  patient reports prosthesis wear daily >/= 12 hrs/day    Baseline  03/16/18: met most days, did take a day off after her cruise    Status  Partially Met      PT [place] #3   Title  Patient picks up 3# objects from floor  with cane support with prosthesis with supervision.     Baseline  03/18/18: needed demo prior to performance, cues during and min assist to prevent fall     Time  --    Period  --    Status  Not Met      PT SHORT TERM GOAL #4   Title  Patient ambulates 500' with rollator walker & prosthesis with supervision.     Baseline  03/18/18: pt made th 500 feet with rollator, however needed 3 seated rest breaks and min guard to min assist for safety    Time  --    Status  Partially Met      PT SHORT TERM GOAL #5   Title  Patient ambulates 71' with single point cane quad tip & prosthesis with minA.     Baseline  03/18/18: max distance to date has been 20 feet with min to mod assist.     Status  Not Met        PT Long Term Goals - 03/25/18 1112      PT LONG TERM GOAL #1   Title  Patient verbalizes & demonstrates proper prosthetic care to enable safe use of prosthesis. (All LTGs Target Date: 04/01/2018)    Baseline  MET 03/23/2018    Time  3    Period  Months    Status  Achieved      PT LONG TERM GOAL #2   Title  Patient tolerates & reports prosthesis wear daily >75% of awake hours without skin issues or limb pain.     Baseline  MET 03/23/2018    Time  3    Period  Months    Status  Achieved      PT LONG TERM GOAL #3   Title  Patient ambulates 500' outdoors including grass with LRAD & prosthesis modified independent for community mobility.     Baseline  Partially MET 03/23/2018  Pt ambulates 500' with Rollator walker & prosthesis with one seated rest break on rollator seat modified independent.     Time  3    Period  Months    Status  Partially Met      PT LONG TERM GOAL #4   Title  Patient negotiates ramps, curbs & stairs with LRAD & prosthesis modified independent to enable community mobility.     Baseline  Met 03/25/18.    Time  3    Period  Months    Status  Achieved      PT LONG TERM GOAL #5   Title  Task of Berg Balance Test with walker support >45/56    Baseline  MET 03/23/2018  Pt standing balance improved to 51/56 with rollator support.    Time  3    Period  Months    Status  Achieved            Plan - 03/25/18 1113     Clinical Impression Statement  Pt met LTG #4 performing community obstacle negotiation at ModI level.  Discussed with pt continuing fitness options and gave pt information.    PT Next Visit Plan  Discharge    Consulted and Agree with Plan of Care  Patient       Patient will benefit from skilled therapeutic intervention in order to improve the following deficits and impairments:     Visit Diagnosis: Other abnormalities of gait and mobility  Muscle weakness (generalized)  Abnormal posture  Unsteadiness on feet  Difficulty in  walking, not elsewhere classified     Problem List Patient Active Problem List   Diagnosis Date Noted  . Benign essential HTN   . Stage 5 chronic kidney disease not on chronic dialysis ([place])   . Above knee amputation status, left   . DM type 2 causing CKD stage 5 ([place]) 03/21/2017  . Anemia of chronic disease 02/05/2017  . Dysplasia of cervix, high grade CIN 2 01/12/2017  . Chronic diastolic CHF (congestive heart failure) ([place]) 11/08/2015  . Total knee replacement status   . Diabetic peripheral neuropathy ([place])   . Major depression, recurrent, chronic ([place])   . Chronic kidney disease (CKD), stage IV (severe) ([place])   . Hepatitis C 08/16/2014  . Peripheral neuropathy 06/24/2013  . Compulsive tobacco user syndrome 06/24/2013   Bjorn Loser, PTA  03/25/18, 11:20 AM [facility] [address] [place], [place], [address] Phone: [phone]   Fax:  [phone]  Name: RAVON MCILHENNY MRN: [id number] Date of Birth: 03/17/1956

## 2018-03-25 NOTE — Telephone Encounter (Signed)
Forms placed in Dr. Tamela Oddi Inbox

## 2018-03-26 NOTE — Telephone Encounter (Signed)
Spoke with patient and she states this is something that she needs.   Doloris Hall,  LPN

## 2018-03-26 NOTE — Telephone Encounter (Signed)
completed

## 2018-03-26 NOTE — Telephone Encounter (Signed)
Completed.

## 2018-03-26 NOTE — Telephone Encounter (Signed)
Received paperwork from Amy, forms have been faxed.

## 2018-03-26 NOTE — Telephone Encounter (Signed)
Home care delivery calling to check status of paper work. Please advise

## 2018-03-26 NOTE — Telephone Encounter (Signed)
Awaiting dr. Tamela Oddi signature, placed into her inbox.   Doloris Hall,  LPN

## 2018-03-26 NOTE — Telephone Encounter (Signed)
Please call pt to verify she wants the adult pull ups; Is she having fecal incontinence? Thanks.

## 2018-03-27 ENCOUNTER — Other Ambulatory Visit (INDEPENDENT_AMBULATORY_CARE_PROVIDER_SITE_OTHER): Payer: Self-pay | Admitting: Orthopedic Surgery

## 2018-03-30 ENCOUNTER — Encounter: Payer: Medicare Other | Admitting: Physical Therapy

## 2018-04-01 ENCOUNTER — Encounter: Payer: Medicare Other | Admitting: Physical Therapy

## 2018-04-06 ENCOUNTER — Encounter: Payer: Medicare Other | Admitting: Physical Therapy

## 2018-04-07 ENCOUNTER — Encounter: Payer: Self-pay | Admitting: Emergency Medicine

## 2018-04-08 ENCOUNTER — Encounter (INDEPENDENT_AMBULATORY_CARE_PROVIDER_SITE_OTHER): Payer: Self-pay | Admitting: Orthopedic Surgery

## 2018-04-08 ENCOUNTER — Ambulatory Visit (INDEPENDENT_AMBULATORY_CARE_PROVIDER_SITE_OTHER): Payer: Medicare Other | Admitting: Physician Assistant

## 2018-04-08 ENCOUNTER — Encounter: Payer: Medicare Other | Admitting: Physical Therapy

## 2018-04-08 VITALS — Ht 67.0 in | Wt 146.0 lb

## 2018-04-08 DIAGNOSIS — N184 Chronic kidney disease, stage 4 (severe): Secondary | ICD-10-CM

## 2018-04-08 DIAGNOSIS — E1142 Type 2 diabetes mellitus with diabetic polyneuropathy: Secondary | ICD-10-CM

## 2018-04-08 DIAGNOSIS — I5032 Chronic diastolic (congestive) heart failure: Secondary | ICD-10-CM

## 2018-04-08 DIAGNOSIS — Z89612 Acquired absence of left leg above knee: Secondary | ICD-10-CM

## 2018-04-09 ENCOUNTER — Encounter (INDEPENDENT_AMBULATORY_CARE_PROVIDER_SITE_OTHER): Payer: Self-pay | Admitting: Physician Assistant

## 2018-04-09 NOTE — Progress Notes (Signed)
Office Visit Note   Patient: Brandy Houston           Date of Birth: 03-May-1956           MRN: [id number] Visit Date: 04/08/2018              Requested by: [name], [address] [place], [place] [address] PCP: [name], MD  Chief Complaint  Patient presents with  . Left Leg - Follow-up    Prosthetic Adjustment for [employer]      HPI: The patient is a 62 yo female here with her son, who is seen for post operative follow up following Left above the knee amputation on 03/17/2017. She has been working with [employer]. She is here today for a re evaluation for a new prosthesis and for new orders for physical therapy at the [facility].  She has had a right brachiobasilic AV fistula placement, but is not yet on HD.   Assessment & Plan: Visit Diagnoses:  1. S/P AKA (above knee amputation), left ([place])   2. Type 2 diabetes mellitus with diabetic polyneuropathy, unspecified whether long term insulin use ([place])   3. Chronic kidney disease (CKD), stage IV (severe) ([place])   4. Chronic diastolic CHF (congestive heart failure) ([place])     Plan: Prescription for Left AKA K-2 Prosthesis for [facility] provided for the patient and orders through Epic to restart Physical therapy at [facility] completed. Follow up in 6 weeks or sooner if difficulties in the interim.   Follow-Up Instructions: Return in about 6 weeks (around 05/20/2018).   Ortho Exam  Patient is alert, oriented, no adenopathy, well-dressed, normal affect, normal respiratory effort. Well healed left above the knee amputation without signs of breakdown or cellulitis. Trace edema.   Imaging: No results found. No images are attached to the encounter.  Labs: Lab Results  Component Value Date   HGBA1C 6.2 (H) 02/01/2018   HGBA1C 6.1 (A) 11/25/2017   HGBA1C 5.2 08/25/2017   ESRSEDRATE 99 (H) 07/21/2017   ESRSEDRATE 102 (H) 10/29/2015   ESRSEDRATE 120 (H) 08/09/2014   CRP 11.1 (H) 07/21/2017   CRP 1.9 (H) 03/12/2017   CRP 1.4 (H) 10/29/2015   REPTSTATUS 02/10/2017 FINAL 02/08/2017   GRAMSTAIN  02/06/2017    FEW WBC PRESENT, PREDOMINANTLY MONONUCLEAR NO ORGANISMS SEEN    CULT >=100,000 COLONIES/mL YEAST (A) 02/08/2017   LABORGA NO GROWTH 02/03/2017     Lab Results  Component Value Date   ALBUMIN 3.8 11/25/2017   ALBUMIN 2.0 (L) 03/27/2017   ALBUMIN 2.6 (L) 03/11/2017    Body mass index is 22.87 kg/m.  Orders:  Orders Placed This Encounter  Procedures  . Ambulatory referral to Physical Therapy   No orders of the defined types were placed in this encounter.    Procedures: No procedures performed  Clinical Data: No additional findings.  ROS:  All other systems negative, except as noted in the HPI. Review of Systems  Objective: Vital Signs: Ht 5\' 7"  (1.702 m)   Wt 146 lb (66.2 kg)   LMP 03/16/2002   BMI 22.87 kg/m   Specialty Comments:  No specialty comments available.  PMFS History: Patient Active Problem List   Diagnosis Date Noted  . Benign essential HTN   . Stage 5 chronic kidney disease not on chronic dialysis ([place])   . Above knee amputation status, left   . DM type 2 causing CKD stage 5 ([place]) 03/21/2017  .  Anemia of chronic disease 02/05/2017  . Dysplasia of cervix, high grade CIN 2 01/12/2017  . Chronic diastolic CHF (congestive heart failure) ([place]) 11/08/2015  . Total knee replacement status   . Diabetic peripheral neuropathy ([place])   . Major depression, recurrent, chronic ([place])   . Chronic kidney disease (CKD), stage IV (severe) ([place])   . Hepatitis C 08/16/2014  . Peripheral neuropathy 06/24/2013  . Compulsive tobacco user syndrome 06/24/2013   Past Medical History:  Diagnosis Date  . Anemia   . Anxiety   . Arthritis    "in my joints" (03/10/2017)  . Chronic diastolic CHF (congestive heart failure) ([place])   . CKD (chronic kidney disease), stage IV ([place])    stage IV. previous HD, none currently 10/30/15 (confirmed 02/05/2017 &  03/10/2017)  . Depression    Chronic  . Diabetic peripheral neuropathy ([place])   . Dysrhythmia    tachycardia, normal ECHO 08-09-14  . GERD (gastroesophageal reflux disease)   . Heart murmur     dx'd 02/05/2017  . Hepatitis C    "tx'd in 2016; I'm negative now" (02/05/2017)  . History of blood transfusion 2017   "w/knee replacement"  . Hypertension   . Osteoarthritis of left knee   . Peripheral neuropathy   . Protein calorie malnutrition ([place])   . Septic arthritis ([place])   . Slow transit constipation   . Type II diabetes mellitus ([name])    IDDM  . Uncontrolled hypertension 02/18/2013  . Unsteady gait     Family History  Problem Relation Age of Onset  . Cancer Mother   . Heart disease Father   . Cancer Sister   . Hypertension Sister   . Cancer Brother   . Diabetes Brother   . Hypertension Brother     Past Surgical History:  Procedure Laterality Date  . AMPUTATION Left 03/20/2017   Procedure: LEFT ABOVE KNEE AMPUTATION;  Surgeon: Newt Minion, MD;  Location: [facility];  Service: Orthopedics;  Laterality: Left;  . APPENDECTOMY    . AV FISTULA PLACEMENT Left 09/13/2014   Procedure: Brachial Artery to Brachial Vein Gortex Four - Seven Stretch GRAFT INSERTION Left Forearm;  Surgeon: Mal Misty, MD;  Location: [facility];  Service: Vascular;  Laterality: Left;  . BASCILIC VEIN TRANSPOSITION Right 10/26/2017   Procedure: BASILIC VEIN TRANSPOSITION FIRST STAGE RIGHT ARM;  Surgeon: Conrad Sherman, MD;  Location: [facility];  Service: Vascular;  Laterality: Right;  . BASCILIC VEIN TRANSPOSITION Right 02/01/2018   Procedure: SECOND STAGE BASILIC VEIN TRANSPOSITION RIGHT UPPER EXTREMITY;  Surgeon: Marty Heck, MD;  Location: [facility];  Service: Vascular;  Laterality: Right;  . CHOLECYSTECTOMY OPEN    . COLON SURGERY    . EXCISIONAL TOTAL KNEE ARTHROPLASTY WITH ANTIBIOTIC SPACERS Left 02/06/2017   Procedure: Incisional total iknee with antibiotic spacer ;  Surgeon: Leandrew Koyanagi, MD;  Location: [facility];  Service: Orthopedics;  Laterality: Left;  . IR FLUORO GUIDE CV LINE RIGHT  02/10/2017  . IR REMOVAL TUN CV CATH W/O FL  04/01/2017  . IR US GUIDE VASC ACCESS RIGHT  02/10/2017  . IRRIGATION AND DEBRIDEMENT KNEE Left 03/12/2017   Procedure: IRRIGATION AND DEBRIDEMENT LEFT KNEE WITH WOUND VAC APPLICATION;  Surgeon: Leandrew Koyanagi, MD;  Location: [facility];  Service: Orthopedics;  Laterality: Left;  . JOINT REPLACEMENT    . KNEE ARTHROSCOPY Right 08/10/2014   Procedure: ARTHROSCOPY I & D KNEE;  Surgeon: Marianna Payment, MD;  [facility];  Service: Orthopedics;  Laterality: Right;  . KNEE ARTHROSCOPY Left 08/11/2014   Procedure: ARTHROSCOPIC WASHOUT LEFT KNEE;  Surgeon: Marianna Payment, MD;  Location: [facility];  Service: Orthopedics;  Laterality: Left;  . KNEE ARTHROSCOPY Left 08/19/2014   Procedure: ARTHROSCOPIC WASHOUT LEFT KNEE;  Surgeon: Leandrew Koyanagi, MD;  Location: [facility];  Service: Orthopedics;  Laterality: Left;  . KNEE ARTHROSCOPY WITH LATERAL MENISECTOMY Left 04/04/2015   Procedure: AND PARTIAL LATERAL MENISECTOMY;  Surgeon: Leandrew Koyanagi, MD;  Location: [facility];  Service: Orthopedics;  Laterality: Left;  . KNEE ARTHROSCOPY WITH MEDIAL MENISECTOMY Left 04/04/2015   Procedure: LEFT KNEE ARTHROSCOPY WITH PARTIAL MEDIAL MENISCECTOMY  AND SYNOVECTOMY;  Surgeon: Leandrew Koyanagi, MD;  Location: [facility];  Service: Orthopedics;  Laterality: Left;  . SHOULDER ARTHROSCOPY Bilateral 08/10/2014   Procedure: I & D BILATERAL SHOULDERS ;  Surgeon: Marianna Payment, MD;  [facility];  Service: Orthopedics;  Laterality: Bilateral;  . SMALL INTESTINE SURGERY     Due to Small Bowel Obstruction; "fixed it when they did my gallbladder OR"  . TEE WITHOUT CARDIOVERSION N/A 08/14/2014   Procedure: TRANSESOPHAGEAL ECHOCARDIOGRAM (TEE);  Surgeon: Thayer Headings, MD;  Location: [facility];  Service: Cardiovascular;  Laterality: N/A;  . TENOSYNOVECTOMY Right 08/11/2014    Procedure: RIGHT WRIST IRRIGATION AND DEBRIDEMENT, TENOSYNOVECTOMY;  Surgeon: Marianna Payment, MD;  Location: [facility];  Service: Orthopedics;  Laterality: Right;  . TOTAL KNEE ARTHROPLASTY Left 11/07/2015   Procedure: LEFT TOTAL KNEE ARTHROPLASTY WITH REVISION OF IMPLANTS;  Surgeon: Leandrew Koyanagi, MD;  Location: [facility];  Service: Orthopedics;  Laterality: Left;  . TUBAL LIGATION     Social History   Occupational History  . Not on file  Tobacco Use  . Smoking status: Current Every Day Smoker    Packs/day: 0.50    Years: 35.00    Pack years: 17.50    Types: Cigarettes  . Smokeless tobacco: Never Used  Substance and Sexual Activity  . Alcohol use: Not Currently    Alcohol/week: 0.0 standard drinks    Comment: 03/10/2017 "I drink a wine cooler a few times/year"  . Drug use: No  . Sexual activity: Never

## 2018-04-13 ENCOUNTER — Encounter: Payer: Medicare Other | Admitting: Physical Therapy

## 2018-04-13 DIAGNOSIS — N185 Chronic kidney disease, stage 5: Secondary | ICD-10-CM | POA: Diagnosis not present

## 2018-04-13 DIAGNOSIS — N189 Chronic kidney disease, unspecified: Secondary | ICD-10-CM | POA: Diagnosis not present

## 2018-04-13 DIAGNOSIS — I120 Hypertensive chronic kidney disease with stage 5 chronic kidney disease or end stage renal disease: Secondary | ICD-10-CM | POA: Diagnosis not present

## 2018-04-13 DIAGNOSIS — D631 Anemia in chronic kidney disease: Secondary | ICD-10-CM | POA: Diagnosis not present

## 2018-04-13 DIAGNOSIS — I770 Arteriovenous fistula, acquired: Secondary | ICD-10-CM | POA: Diagnosis not present

## 2018-04-13 DIAGNOSIS — N2581 Secondary hyperparathyroidism of renal origin: Secondary | ICD-10-CM | POA: Diagnosis not present

## 2018-04-13 DIAGNOSIS — G629 Polyneuropathy, unspecified: Secondary | ICD-10-CM | POA: Diagnosis not present

## 2018-04-13 DIAGNOSIS — N049 Nephrotic syndrome with unspecified morphologic changes: Secondary | ICD-10-CM | POA: Diagnosis not present

## 2018-04-13 DIAGNOSIS — Z720 Tobacco use: Secondary | ICD-10-CM | POA: Diagnosis not present

## 2018-04-13 LAB — BASIC METABOLIC PANEL
BUN: 111 — AB (ref 4–21)
Creatinine: 6 — AB (ref 0.5–1.1)
Glucose: 147
Potassium: 4.1 (ref 3.4–5.3)
Sodium: 139 (ref 137–147)

## 2018-04-13 LAB — CBC AND DIFFERENTIAL
HCT: 27 — AB (ref 36–46)
Hemoglobin: 8.9 — AB (ref 12.0–16.0)
Platelets: 176 (ref 150–399)
WBC: 5.4

## 2018-04-15 ENCOUNTER — Encounter: Payer: Self-pay | Admitting: Emergency Medicine

## 2018-04-15 ENCOUNTER — Encounter: Payer: Medicare Other | Admitting: Physical Therapy

## 2018-04-16 DIAGNOSIS — N186 End stage renal disease: Secondary | ICD-10-CM | POA: Diagnosis not present

## 2018-04-16 DIAGNOSIS — Z992 Dependence on renal dialysis: Secondary | ICD-10-CM | POA: Diagnosis not present

## 2018-04-16 DIAGNOSIS — E1129 Type 2 diabetes mellitus with other diabetic kidney complication: Secondary | ICD-10-CM | POA: Diagnosis not present

## 2018-04-19 DIAGNOSIS — F17210 Nicotine dependence, cigarettes, uncomplicated: Secondary | ICD-10-CM | POA: Insufficient documentation

## 2018-04-19 DIAGNOSIS — N186 End stage renal disease: Secondary | ICD-10-CM | POA: Diagnosis present

## 2018-04-19 DIAGNOSIS — N2581 Secondary hyperparathyroidism of renal origin: Secondary | ICD-10-CM | POA: Insufficient documentation

## 2018-04-19 DIAGNOSIS — Z992 Dependence on renal dialysis: Secondary | ICD-10-CM | POA: Diagnosis present

## 2018-04-19 DIAGNOSIS — D509 Iron deficiency anemia, unspecified: Secondary | ICD-10-CM | POA: Insufficient documentation

## 2018-04-19 DIAGNOSIS — N049 Nephrotic syndrome with unspecified morphologic changes: Secondary | ICD-10-CM | POA: Insufficient documentation

## 2018-04-20 DIAGNOSIS — N186 End stage renal disease: Secondary | ICD-10-CM | POA: Diagnosis not present

## 2018-04-20 DIAGNOSIS — D509 Iron deficiency anemia, unspecified: Secondary | ICD-10-CM | POA: Diagnosis not present

## 2018-04-20 DIAGNOSIS — N2581 Secondary hyperparathyroidism of renal origin: Secondary | ICD-10-CM | POA: Diagnosis not present

## 2018-04-20 DIAGNOSIS — D631 Anemia in chronic kidney disease: Secondary | ICD-10-CM | POA: Diagnosis not present

## 2018-04-20 DIAGNOSIS — E1129 Type 2 diabetes mellitus with other diabetic kidney complication: Secondary | ICD-10-CM | POA: Diagnosis not present

## 2018-04-21 ENCOUNTER — Encounter (HOSPITAL_COMMUNITY): Payer: Medicare Other

## 2018-04-22 DIAGNOSIS — N186 End stage renal disease: Secondary | ICD-10-CM | POA: Diagnosis not present

## 2018-04-22 DIAGNOSIS — D631 Anemia in chronic kidney disease: Secondary | ICD-10-CM | POA: Diagnosis not present

## 2018-04-22 DIAGNOSIS — N2581 Secondary hyperparathyroidism of renal origin: Secondary | ICD-10-CM | POA: Diagnosis not present

## 2018-04-22 DIAGNOSIS — E1129 Type 2 diabetes mellitus with other diabetic kidney complication: Secondary | ICD-10-CM | POA: Diagnosis not present

## 2018-04-22 DIAGNOSIS — D509 Iron deficiency anemia, unspecified: Secondary | ICD-10-CM | POA: Diagnosis not present

## 2018-04-24 DIAGNOSIS — N2581 Secondary hyperparathyroidism of renal origin: Secondary | ICD-10-CM | POA: Diagnosis not present

## 2018-04-24 DIAGNOSIS — D509 Iron deficiency anemia, unspecified: Secondary | ICD-10-CM | POA: Diagnosis not present

## 2018-04-24 DIAGNOSIS — E1129 Type 2 diabetes mellitus with other diabetic kidney complication: Secondary | ICD-10-CM | POA: Diagnosis not present

## 2018-04-24 DIAGNOSIS — D631 Anemia in chronic kidney disease: Secondary | ICD-10-CM | POA: Diagnosis not present

## 2018-04-24 DIAGNOSIS — N186 End stage renal disease: Secondary | ICD-10-CM | POA: Diagnosis not present

## 2018-04-26 ENCOUNTER — Other Ambulatory Visit (INDEPENDENT_AMBULATORY_CARE_PROVIDER_SITE_OTHER): Payer: Self-pay | Admitting: Orthopedic Surgery

## 2018-04-27 DIAGNOSIS — N2581 Secondary hyperparathyroidism of renal origin: Secondary | ICD-10-CM | POA: Diagnosis not present

## 2018-04-27 DIAGNOSIS — D631 Anemia in chronic kidney disease: Secondary | ICD-10-CM | POA: Diagnosis not present

## 2018-04-27 DIAGNOSIS — E1129 Type 2 diabetes mellitus with other diabetic kidney complication: Secondary | ICD-10-CM | POA: Diagnosis not present

## 2018-04-27 DIAGNOSIS — D509 Iron deficiency anemia, unspecified: Secondary | ICD-10-CM | POA: Diagnosis not present

## 2018-04-27 DIAGNOSIS — N186 End stage renal disease: Secondary | ICD-10-CM | POA: Diagnosis not present

## 2018-04-29 DIAGNOSIS — D631 Anemia in chronic kidney disease: Secondary | ICD-10-CM | POA: Diagnosis not present

## 2018-04-29 DIAGNOSIS — D509 Iron deficiency anemia, unspecified: Secondary | ICD-10-CM | POA: Diagnosis not present

## 2018-04-29 DIAGNOSIS — E1129 Type 2 diabetes mellitus with other diabetic kidney complication: Secondary | ICD-10-CM | POA: Diagnosis not present

## 2018-04-29 DIAGNOSIS — N186 End stage renal disease: Secondary | ICD-10-CM | POA: Diagnosis not present

## 2018-04-29 DIAGNOSIS — N2581 Secondary hyperparathyroidism of renal origin: Secondary | ICD-10-CM | POA: Diagnosis not present

## 2018-05-01 DIAGNOSIS — N2581 Secondary hyperparathyroidism of renal origin: Secondary | ICD-10-CM | POA: Diagnosis not present

## 2018-05-01 DIAGNOSIS — E1129 Type 2 diabetes mellitus with other diabetic kidney complication: Secondary | ICD-10-CM | POA: Diagnosis not present

## 2018-05-01 DIAGNOSIS — N186 End stage renal disease: Secondary | ICD-10-CM | POA: Diagnosis not present

## 2018-05-01 DIAGNOSIS — D631 Anemia in chronic kidney disease: Secondary | ICD-10-CM | POA: Diagnosis not present

## 2018-05-01 DIAGNOSIS — D509 Iron deficiency anemia, unspecified: Secondary | ICD-10-CM | POA: Diagnosis not present

## 2018-05-04 DIAGNOSIS — N2581 Secondary hyperparathyroidism of renal origin: Secondary | ICD-10-CM | POA: Diagnosis not present

## 2018-05-04 DIAGNOSIS — N186 End stage renal disease: Secondary | ICD-10-CM | POA: Diagnosis not present

## 2018-05-04 DIAGNOSIS — E1129 Type 2 diabetes mellitus with other diabetic kidney complication: Secondary | ICD-10-CM | POA: Diagnosis not present

## 2018-05-04 DIAGNOSIS — D631 Anemia in chronic kidney disease: Secondary | ICD-10-CM | POA: Diagnosis not present

## 2018-05-04 DIAGNOSIS — D509 Iron deficiency anemia, unspecified: Secondary | ICD-10-CM | POA: Diagnosis not present

## 2018-05-06 DIAGNOSIS — N2581 Secondary hyperparathyroidism of renal origin: Secondary | ICD-10-CM | POA: Diagnosis not present

## 2018-05-06 DIAGNOSIS — D631 Anemia in chronic kidney disease: Secondary | ICD-10-CM | POA: Diagnosis not present

## 2018-05-06 DIAGNOSIS — E1129 Type 2 diabetes mellitus with other diabetic kidney complication: Secondary | ICD-10-CM | POA: Diagnosis not present

## 2018-05-06 DIAGNOSIS — D509 Iron deficiency anemia, unspecified: Secondary | ICD-10-CM | POA: Diagnosis not present

## 2018-05-06 DIAGNOSIS — N186 End stage renal disease: Secondary | ICD-10-CM | POA: Diagnosis not present

## 2018-05-08 DIAGNOSIS — D509 Iron deficiency anemia, unspecified: Secondary | ICD-10-CM | POA: Diagnosis not present

## 2018-05-08 DIAGNOSIS — N186 End stage renal disease: Secondary | ICD-10-CM | POA: Diagnosis not present

## 2018-05-08 DIAGNOSIS — E1129 Type 2 diabetes mellitus with other diabetic kidney complication: Secondary | ICD-10-CM | POA: Diagnosis not present

## 2018-05-08 DIAGNOSIS — N2581 Secondary hyperparathyroidism of renal origin: Secondary | ICD-10-CM | POA: Diagnosis not present

## 2018-05-08 DIAGNOSIS — D631 Anemia in chronic kidney disease: Secondary | ICD-10-CM | POA: Diagnosis not present

## 2018-05-10 DIAGNOSIS — D631 Anemia in chronic kidney disease: Secondary | ICD-10-CM | POA: Diagnosis not present

## 2018-05-10 DIAGNOSIS — E1129 Type 2 diabetes mellitus with other diabetic kidney complication: Secondary | ICD-10-CM | POA: Diagnosis not present

## 2018-05-10 DIAGNOSIS — D509 Iron deficiency anemia, unspecified: Secondary | ICD-10-CM | POA: Diagnosis not present

## 2018-05-10 DIAGNOSIS — N186 End stage renal disease: Secondary | ICD-10-CM | POA: Diagnosis not present

## 2018-05-10 DIAGNOSIS — N2581 Secondary hyperparathyroidism of renal origin: Secondary | ICD-10-CM | POA: Diagnosis not present

## 2018-05-11 DIAGNOSIS — E440 Moderate protein-calorie malnutrition: Secondary | ICD-10-CM | POA: Insufficient documentation

## 2018-05-11 DIAGNOSIS — E43 Unspecified severe protein-calorie malnutrition: Secondary | ICD-10-CM | POA: Insufficient documentation

## 2018-05-12 DIAGNOSIS — N186 End stage renal disease: Secondary | ICD-10-CM | POA: Diagnosis not present

## 2018-05-12 DIAGNOSIS — E1129 Type 2 diabetes mellitus with other diabetic kidney complication: Secondary | ICD-10-CM | POA: Diagnosis not present

## 2018-05-12 DIAGNOSIS — D631 Anemia in chronic kidney disease: Secondary | ICD-10-CM | POA: Diagnosis not present

## 2018-05-12 DIAGNOSIS — N2581 Secondary hyperparathyroidism of renal origin: Secondary | ICD-10-CM | POA: Diagnosis not present

## 2018-05-12 DIAGNOSIS — D509 Iron deficiency anemia, unspecified: Secondary | ICD-10-CM | POA: Diagnosis not present

## 2018-05-15 DIAGNOSIS — N186 End stage renal disease: Secondary | ICD-10-CM | POA: Diagnosis not present

## 2018-05-15 DIAGNOSIS — N2581 Secondary hyperparathyroidism of renal origin: Secondary | ICD-10-CM | POA: Diagnosis not present

## 2018-05-15 DIAGNOSIS — E1129 Type 2 diabetes mellitus with other diabetic kidney complication: Secondary | ICD-10-CM | POA: Diagnosis not present

## 2018-05-15 DIAGNOSIS — D631 Anemia in chronic kidney disease: Secondary | ICD-10-CM | POA: Diagnosis not present

## 2018-05-15 DIAGNOSIS — D509 Iron deficiency anemia, unspecified: Secondary | ICD-10-CM | POA: Diagnosis not present

## 2018-05-16 DIAGNOSIS — E1129 Type 2 diabetes mellitus with other diabetic kidney complication: Secondary | ICD-10-CM | POA: Diagnosis not present

## 2018-05-16 DIAGNOSIS — N186 End stage renal disease: Secondary | ICD-10-CM | POA: Diagnosis not present

## 2018-05-16 DIAGNOSIS — Z992 Dependence on renal dialysis: Secondary | ICD-10-CM | POA: Diagnosis not present

## 2018-05-18 DIAGNOSIS — Z23 Encounter for immunization: Secondary | ICD-10-CM | POA: Diagnosis not present

## 2018-05-18 DIAGNOSIS — D631 Anemia in chronic kidney disease: Secondary | ICD-10-CM | POA: Diagnosis not present

## 2018-05-18 DIAGNOSIS — N2581 Secondary hyperparathyroidism of renal origin: Secondary | ICD-10-CM | POA: Diagnosis not present

## 2018-05-18 DIAGNOSIS — E1129 Type 2 diabetes mellitus with other diabetic kidney complication: Secondary | ICD-10-CM | POA: Diagnosis not present

## 2018-05-18 DIAGNOSIS — D509 Iron deficiency anemia, unspecified: Secondary | ICD-10-CM | POA: Diagnosis not present

## 2018-05-18 DIAGNOSIS — N186 End stage renal disease: Secondary | ICD-10-CM | POA: Diagnosis not present

## 2018-05-20 DIAGNOSIS — N2581 Secondary hyperparathyroidism of renal origin: Secondary | ICD-10-CM | POA: Diagnosis not present

## 2018-05-20 DIAGNOSIS — E1129 Type 2 diabetes mellitus with other diabetic kidney complication: Secondary | ICD-10-CM | POA: Diagnosis not present

## 2018-05-20 DIAGNOSIS — N186 End stage renal disease: Secondary | ICD-10-CM | POA: Diagnosis not present

## 2018-05-20 DIAGNOSIS — D631 Anemia in chronic kidney disease: Secondary | ICD-10-CM | POA: Diagnosis not present

## 2018-05-20 DIAGNOSIS — D509 Iron deficiency anemia, unspecified: Secondary | ICD-10-CM | POA: Diagnosis not present

## 2018-05-20 DIAGNOSIS — Z23 Encounter for immunization: Secondary | ICD-10-CM | POA: Diagnosis not present

## 2018-05-22 DIAGNOSIS — E1129 Type 2 diabetes mellitus with other diabetic kidney complication: Secondary | ICD-10-CM | POA: Diagnosis not present

## 2018-05-22 DIAGNOSIS — D509 Iron deficiency anemia, unspecified: Secondary | ICD-10-CM | POA: Diagnosis not present

## 2018-05-22 DIAGNOSIS — N2581 Secondary hyperparathyroidism of renal origin: Secondary | ICD-10-CM | POA: Diagnosis not present

## 2018-05-22 DIAGNOSIS — Z23 Encounter for immunization: Secondary | ICD-10-CM | POA: Diagnosis not present

## 2018-05-22 DIAGNOSIS — N186 End stage renal disease: Secondary | ICD-10-CM | POA: Diagnosis not present

## 2018-05-22 DIAGNOSIS — D631 Anemia in chronic kidney disease: Secondary | ICD-10-CM | POA: Diagnosis not present

## 2018-05-24 ENCOUNTER — Other Ambulatory Visit (INDEPENDENT_AMBULATORY_CARE_PROVIDER_SITE_OTHER): Payer: Self-pay | Admitting: Orthopedic Surgery

## 2018-05-25 DIAGNOSIS — E1129 Type 2 diabetes mellitus with other diabetic kidney complication: Secondary | ICD-10-CM | POA: Diagnosis not present

## 2018-05-25 DIAGNOSIS — N2581 Secondary hyperparathyroidism of renal origin: Secondary | ICD-10-CM | POA: Diagnosis not present

## 2018-05-25 DIAGNOSIS — D509 Iron deficiency anemia, unspecified: Secondary | ICD-10-CM | POA: Diagnosis not present

## 2018-05-25 DIAGNOSIS — D631 Anemia in chronic kidney disease: Secondary | ICD-10-CM | POA: Diagnosis not present

## 2018-05-25 DIAGNOSIS — Z23 Encounter for immunization: Secondary | ICD-10-CM | POA: Diagnosis not present

## 2018-05-25 DIAGNOSIS — N186 End stage renal disease: Secondary | ICD-10-CM | POA: Diagnosis not present

## 2018-05-27 DIAGNOSIS — N186 End stage renal disease: Secondary | ICD-10-CM | POA: Diagnosis not present

## 2018-05-27 DIAGNOSIS — D509 Iron deficiency anemia, unspecified: Secondary | ICD-10-CM | POA: Diagnosis not present

## 2018-05-27 DIAGNOSIS — E1129 Type 2 diabetes mellitus with other diabetic kidney complication: Secondary | ICD-10-CM | POA: Diagnosis not present

## 2018-05-27 DIAGNOSIS — D631 Anemia in chronic kidney disease: Secondary | ICD-10-CM | POA: Diagnosis not present

## 2018-05-27 DIAGNOSIS — Z23 Encounter for immunization: Secondary | ICD-10-CM | POA: Diagnosis not present

## 2018-05-27 DIAGNOSIS — N2581 Secondary hyperparathyroidism of renal origin: Secondary | ICD-10-CM | POA: Diagnosis not present

## 2018-05-29 DIAGNOSIS — Z23 Encounter for immunization: Secondary | ICD-10-CM | POA: Diagnosis not present

## 2018-05-29 DIAGNOSIS — E1129 Type 2 diabetes mellitus with other diabetic kidney complication: Secondary | ICD-10-CM | POA: Diagnosis not present

## 2018-05-29 DIAGNOSIS — N186 End stage renal disease: Secondary | ICD-10-CM | POA: Diagnosis not present

## 2018-05-29 DIAGNOSIS — D631 Anemia in chronic kidney disease: Secondary | ICD-10-CM | POA: Diagnosis not present

## 2018-05-29 DIAGNOSIS — D509 Iron deficiency anemia, unspecified: Secondary | ICD-10-CM | POA: Diagnosis not present

## 2018-05-29 DIAGNOSIS — N2581 Secondary hyperparathyroidism of renal origin: Secondary | ICD-10-CM | POA: Diagnosis not present

## 2018-06-01 DIAGNOSIS — D509 Iron deficiency anemia, unspecified: Secondary | ICD-10-CM | POA: Diagnosis not present

## 2018-06-01 DIAGNOSIS — N2581 Secondary hyperparathyroidism of renal origin: Secondary | ICD-10-CM | POA: Diagnosis not present

## 2018-06-01 DIAGNOSIS — D631 Anemia in chronic kidney disease: Secondary | ICD-10-CM | POA: Diagnosis not present

## 2018-06-01 DIAGNOSIS — N186 End stage renal disease: Secondary | ICD-10-CM | POA: Diagnosis not present

## 2018-06-01 DIAGNOSIS — Z23 Encounter for immunization: Secondary | ICD-10-CM | POA: Diagnosis not present

## 2018-06-01 DIAGNOSIS — E1129 Type 2 diabetes mellitus with other diabetic kidney complication: Secondary | ICD-10-CM | POA: Diagnosis not present

## 2018-06-03 DIAGNOSIS — D631 Anemia in chronic kidney disease: Secondary | ICD-10-CM | POA: Diagnosis not present

## 2018-06-03 DIAGNOSIS — Z23 Encounter for immunization: Secondary | ICD-10-CM | POA: Diagnosis not present

## 2018-06-03 DIAGNOSIS — E1129 Type 2 diabetes mellitus with other diabetic kidney complication: Secondary | ICD-10-CM | POA: Diagnosis not present

## 2018-06-03 DIAGNOSIS — D509 Iron deficiency anemia, unspecified: Secondary | ICD-10-CM | POA: Diagnosis not present

## 2018-06-03 DIAGNOSIS — N2581 Secondary hyperparathyroidism of renal origin: Secondary | ICD-10-CM | POA: Diagnosis not present

## 2018-06-03 DIAGNOSIS — N186 End stage renal disease: Secondary | ICD-10-CM | POA: Diagnosis not present

## 2018-06-05 DIAGNOSIS — E1129 Type 2 diabetes mellitus with other diabetic kidney complication: Secondary | ICD-10-CM | POA: Diagnosis not present

## 2018-06-05 DIAGNOSIS — D509 Iron deficiency anemia, unspecified: Secondary | ICD-10-CM | POA: Diagnosis not present

## 2018-06-05 DIAGNOSIS — Z23 Encounter for immunization: Secondary | ICD-10-CM | POA: Diagnosis not present

## 2018-06-05 DIAGNOSIS — N186 End stage renal disease: Secondary | ICD-10-CM | POA: Diagnosis not present

## 2018-06-05 DIAGNOSIS — N2581 Secondary hyperparathyroidism of renal origin: Secondary | ICD-10-CM | POA: Diagnosis not present

## 2018-06-05 DIAGNOSIS — D631 Anemia in chronic kidney disease: Secondary | ICD-10-CM | POA: Diagnosis not present

## 2018-06-07 DIAGNOSIS — Z23 Encounter for immunization: Secondary | ICD-10-CM | POA: Diagnosis not present

## 2018-06-07 DIAGNOSIS — D631 Anemia in chronic kidney disease: Secondary | ICD-10-CM | POA: Diagnosis not present

## 2018-06-07 DIAGNOSIS — D509 Iron deficiency anemia, unspecified: Secondary | ICD-10-CM | POA: Diagnosis not present

## 2018-06-07 DIAGNOSIS — N2581 Secondary hyperparathyroidism of renal origin: Secondary | ICD-10-CM | POA: Diagnosis not present

## 2018-06-07 DIAGNOSIS — E1129 Type 2 diabetes mellitus with other diabetic kidney complication: Secondary | ICD-10-CM | POA: Diagnosis not present

## 2018-06-07 DIAGNOSIS — N186 End stage renal disease: Secondary | ICD-10-CM | POA: Diagnosis not present

## 2018-06-10 DIAGNOSIS — N2581 Secondary hyperparathyroidism of renal origin: Secondary | ICD-10-CM | POA: Diagnosis not present

## 2018-06-10 DIAGNOSIS — D631 Anemia in chronic kidney disease: Secondary | ICD-10-CM | POA: Diagnosis not present

## 2018-06-10 DIAGNOSIS — D509 Iron deficiency anemia, unspecified: Secondary | ICD-10-CM | POA: Diagnosis not present

## 2018-06-10 DIAGNOSIS — Z23 Encounter for immunization: Secondary | ICD-10-CM | POA: Diagnosis not present

## 2018-06-10 DIAGNOSIS — E1129 Type 2 diabetes mellitus with other diabetic kidney complication: Secondary | ICD-10-CM | POA: Diagnosis not present

## 2018-06-10 DIAGNOSIS — N186 End stage renal disease: Secondary | ICD-10-CM | POA: Diagnosis not present

## 2018-06-12 DIAGNOSIS — D509 Iron deficiency anemia, unspecified: Secondary | ICD-10-CM | POA: Diagnosis not present

## 2018-06-12 DIAGNOSIS — N2581 Secondary hyperparathyroidism of renal origin: Secondary | ICD-10-CM | POA: Diagnosis not present

## 2018-06-12 DIAGNOSIS — Z23 Encounter for immunization: Secondary | ICD-10-CM | POA: Diagnosis not present

## 2018-06-12 DIAGNOSIS — D631 Anemia in chronic kidney disease: Secondary | ICD-10-CM | POA: Diagnosis not present

## 2018-06-12 DIAGNOSIS — N186 End stage renal disease: Secondary | ICD-10-CM | POA: Diagnosis not present

## 2018-06-12 DIAGNOSIS — E1129 Type 2 diabetes mellitus with other diabetic kidney complication: Secondary | ICD-10-CM | POA: Diagnosis not present

## 2018-06-14 DIAGNOSIS — N186 End stage renal disease: Secondary | ICD-10-CM | POA: Diagnosis not present

## 2018-06-14 DIAGNOSIS — D509 Iron deficiency anemia, unspecified: Secondary | ICD-10-CM | POA: Diagnosis not present

## 2018-06-14 DIAGNOSIS — N2581 Secondary hyperparathyroidism of renal origin: Secondary | ICD-10-CM | POA: Diagnosis not present

## 2018-06-14 DIAGNOSIS — E1129 Type 2 diabetes mellitus with other diabetic kidney complication: Secondary | ICD-10-CM | POA: Diagnosis not present

## 2018-06-14 DIAGNOSIS — D631 Anemia in chronic kidney disease: Secondary | ICD-10-CM | POA: Diagnosis not present

## 2018-06-14 DIAGNOSIS — Z23 Encounter for immunization: Secondary | ICD-10-CM | POA: Diagnosis not present

## 2018-06-16 DIAGNOSIS — Z992 Dependence on renal dialysis: Secondary | ICD-10-CM | POA: Diagnosis not present

## 2018-06-16 DIAGNOSIS — E1129 Type 2 diabetes mellitus with other diabetic kidney complication: Secondary | ICD-10-CM | POA: Diagnosis not present

## 2018-06-16 DIAGNOSIS — N186 End stage renal disease: Secondary | ICD-10-CM | POA: Diagnosis not present

## 2018-06-17 ENCOUNTER — Ambulatory Visit (INDEPENDENT_AMBULATORY_CARE_PROVIDER_SITE_OTHER): Payer: Medicare Other | Admitting: Family

## 2018-06-17 ENCOUNTER — Other Ambulatory Visit: Payer: Self-pay

## 2018-06-17 ENCOUNTER — Encounter: Payer: Self-pay | Admitting: Family

## 2018-06-17 VITALS — BP 167/73 | HR 88 | Temp 99.1°F | Resp 20 | Ht 67.0 in | Wt 142.0 lb

## 2018-06-17 DIAGNOSIS — Z23 Encounter for immunization: Secondary | ICD-10-CM | POA: Diagnosis not present

## 2018-06-17 DIAGNOSIS — E1129 Type 2 diabetes mellitus with other diabetic kidney complication: Secondary | ICD-10-CM | POA: Diagnosis not present

## 2018-06-17 DIAGNOSIS — N2581 Secondary hyperparathyroidism of renal origin: Secondary | ICD-10-CM | POA: Diagnosis not present

## 2018-06-17 DIAGNOSIS — T82590A Other mechanical complication of surgically created arteriovenous fistula, initial encounter: Secondary | ICD-10-CM | POA: Diagnosis not present

## 2018-06-17 DIAGNOSIS — N186 End stage renal disease: Secondary | ICD-10-CM | POA: Diagnosis not present

## 2018-06-17 DIAGNOSIS — D509 Iron deficiency anemia, unspecified: Secondary | ICD-10-CM | POA: Diagnosis not present

## 2018-06-17 DIAGNOSIS — Z992 Dependence on renal dialysis: Secondary | ICD-10-CM | POA: Diagnosis not present

## 2018-06-17 DIAGNOSIS — D631 Anemia in chronic kidney disease: Secondary | ICD-10-CM | POA: Diagnosis not present

## 2018-06-17 NOTE — Progress Notes (Signed)
CC: difficult cannulation of AVF, Established Dialysis Access  History of Present Illness  Brandy Houston is a 63 y.o. (Aug 08, 1955) female who is s/p rightsecond stage basilic vein transposition (brachiobasilic arteriovenous fistula) placement on 02-01-18 by Dr. Carlis Abbott.  She was last evaluated on 03-17-18 by M. Collins PA-C for surgical follow up. At that time  She was not on HD yet.  The right UE BB fistula was ready for cannulation when she needs to start HD.   Pt was to follow up PRN.    She returns today with c/o difficult cannulation of right upper arm AVF, and small hematoma at proximal end of AVF.   She started hemodialysis about October or November 2019. She has some successful HD runs, about half.  She dialyzes on T-TH-S at [facility], next door.   She denies any steal type symptoms in her right hand, does report moderate numbness in her right forearm, medial aspect.   She has a left AKA, states she had a MRSA infection in the left leg. She denies problems with her right leg. She states she has a left AKA prosthesis and will start physical therapy for this later this month.    Past Medical History:  Diagnosis Date  . Anemia   . Anxiety   . Arthritis    "in my joints" (03/10/2017)  . Chronic diastolic CHF (congestive heart failure) ([place])   . CKD (chronic kidney disease), stage IV ([place])    stage IV. previous HD, none currently 10/30/15 (confirmed 02/05/2017 & 03/10/2017)  . Depression    Chronic  . Diabetic peripheral neuropathy ([place])   . Dysrhythmia    tachycardia, normal ECHO 08-09-14  . GERD (gastroesophageal reflux disease)   . Heart murmur     dx'd 02/05/2017  . Hepatitis C    "tx'd in 2016; I'm negative now" (02/05/2017)  . History of blood transfusion 2017   "w/knee replacement"  . Hypertension   . Osteoarthritis of left knee   . Peripheral neuropathy   . Protein calorie malnutrition ([place])   . Septic arthritis ([place])   . Slow transit constipation   . Type II  diabetes mellitus ([name])    IDDM  . Uncontrolled hypertension 02/18/2013  . Unsteady gait     Social History Social History   Tobacco Use  . Smoking status: Current Every Day Smoker    Packs/day: 0.50    Years: 35.00    Pack years: 17.50    Types: Cigarettes  . Smokeless tobacco: Never Used  Substance Use Topics  . Alcohol use: Not Currently    Alcohol/week: 0.0 standard drinks    Comment: 03/10/2017 "I drink a wine cooler a few times/year"  . Drug use: No    Family History Family History  Problem Relation Age of Onset  . Cancer Mother   . Heart disease Father   . Cancer Sister   . Hypertension Sister   . Cancer Brother   . Diabetes Brother   . Hypertension Brother     Surgical History Past Surgical History:  Procedure Laterality Date  . AMPUTATION Left 03/20/2017   Procedure: LEFT ABOVE KNEE AMPUTATION;  Surgeon: Newt Minion, MD;  Location: [facility];  Service: Orthopedics;  Laterality: Left;  . APPENDECTOMY    . AV FISTULA PLACEMENT Left 09/13/2014   Procedure: Brachial Artery to Brachial Vein Gortex Four - Seven Stretch GRAFT INSERTION Left Forearm;  Surgeon: Mal Misty, MD;  Location: [facility];  Service:  Vascular;  Laterality: Left;  . BASCILIC VEIN TRANSPOSITION Right 10/26/2017   Procedure: BASILIC VEIN TRANSPOSITION FIRST STAGE RIGHT ARM;  Surgeon: Conrad Franklin, MD;  Location: [place];  Service: Vascular;  Laterality: Right;  . BASCILIC VEIN TRANSPOSITION Right 02/01/2018   Procedure: SECOND STAGE BASILIC VEIN TRANSPOSITION RIGHT UPPER EXTREMITY;  Surgeon: Marty Heck, MD;  Location: [place];  Service: Vascular;  Laterality: Right;  . CHOLECYSTECTOMY OPEN    . COLON SURGERY    . EXCISIONAL TOTAL KNEE ARTHROPLASTY WITH ANTIBIOTIC SPACERS Left 02/06/2017   Procedure: Incisional total iknee with antibiotic spacer ;  Surgeon: Leandrew Koyanagi, MD;  Location: [facility];  Service: Orthopedics;  Laterality: Left;  . IR FLUORO GUIDE CV LINE RIGHT  02/10/2017  . IR REMOVAL TUN  CV CATH W/O FL  04/01/2017  . IR US GUIDE VASC ACCESS RIGHT  02/10/2017  . IRRIGATION AND DEBRIDEMENT KNEE Left 03/12/2017   Procedure: IRRIGATION AND DEBRIDEMENT LEFT KNEE WITH WOUND VAC APPLICATION;  Surgeon: Leandrew Koyanagi, MD;  Location: [place];  Service: Orthopedics;  Laterality: Left;  . JOINT REPLACEMENT    . KNEE ARTHROSCOPY Right 08/10/2014   Procedure: ARTHROSCOPY I & D KNEE;  Surgeon: Marianna Payment, MD;  Location: [facility];  Service: Orthopedics;  Laterality: Right;  . KNEE ARTHROSCOPY Left 08/11/2014   Procedure: ARTHROSCOPIC WASHOUT LEFT KNEE;  Surgeon: Marianna Payment, MD;  Location: [place];  Service: Orthopedics;  Laterality: Left;  . KNEE ARTHROSCOPY Left 08/19/2014   Procedure: ARTHROSCOPIC WASHOUT LEFT KNEE;  Surgeon: Leandrew Koyanagi, MD;  Location: [place];  Service: Orthopedics;  Laterality: Left;  . KNEE ARTHROSCOPY WITH LATERAL MENISECTOMY Left 04/04/2015   Procedure: AND PARTIAL LATERAL MENISECTOMY;  Surgeon: Leandrew Koyanagi, MD;  Location: [place];  Service: Orthopedics;  Laterality: Left;  . KNEE ARTHROSCOPY WITH MEDIAL MENISECTOMY Left 04/04/2015   Procedure: LEFT KNEE ARTHROSCOPY WITH PARTIAL MEDIAL MENISCECTOMY  AND SYNOVECTOMY;  Surgeon: Leandrew Koyanagi, MD;  Location: [place];  Service: Orthopedics;  Laterality: Left;  . SHOULDER ARTHROSCOPY Bilateral 08/10/2014   Procedure: I & D BILATERAL SHOULDERS ;  Surgeon: Marianna Payment, MD;  Location: [facility];  Service: Orthopedics;  Laterality: Bilateral;  . SMALL INTESTINE SURGERY     Due to Small Bowel Obstruction; "fixed it when they did my gallbladder OR"  . TEE WITHOUT CARDIOVERSION N/A 08/14/2014   Procedure: TRANSESOPHAGEAL ECHOCARDIOGRAM (TEE);  Surgeon: Thayer Headings, MD;  Location: [name];  Service: Cardiovascular;  Laterality: N/A;  . TENOSYNOVECTOMY Right 08/11/2014   Procedure: RIGHT WRIST IRRIGATION AND DEBRIDEMENT, TENOSYNOVECTOMY;  Surgeon: Marianna Payment, MD;  Location: [place];  Service: Orthopedics;  Laterality: Right;  . TOTAL KNEE ARTHROPLASTY Left 11/07/2015   Procedure: LEFT TOTAL KNEE ARTHROPLASTY WITH REVISION OF IMPLANTS;  Surgeon: Leandrew Koyanagi, MD;  Location: [facility];  Service: Orthopedics;  Laterality: Left;  . TUBAL LIGATION      Allergies  Allergen Reactions  . Compazine [Prochlorperazine] Shortness Of Breath, Swelling and Other (See Comments)    TONGUE SWELLS  . Shellfish-Derived Products Anaphylaxis  . Iodinated Diagnostic Agents Hives and Rash  . Omnipaque [Iohexol] Hives  . Sulfa Antibiotics Rash    Current Outpatient Medications  Medication Sig Dispense Refill  . amLODipine (NORVASC) 10 MG tablet Take 1 tablet (10 mg total) by mouth at bedtime. (Patient taking differently: Take 5 mg by mouth at bedtime. ) 30 tablet 0  .  aspirin EC 81 MG tablet Take 81 mg by mouth daily.    [name] atorvastatin (LIPITOR) 10 MG tablet Take 1 tablet (10 mg total) by mouth at bedtime. 90 tablet 3  . Blood Glucose Monitoring Suppl (ACCU-CHEK AVIVA PLUS) W/DEVICE KIT Check sugars TID for E11.65 1 kit 0  . calcitRIOL (ROCALTROL) 0.25 MCG capsule Take 1 capsule (0.25 mcg total) by mouth every Monday, Wednesday, and Friday. (Patient taking differently: Take 0.25 mcg by mouth daily. ) 90 capsule 0  . calcium carbonate (TUMS EX) 750 MG chewable tablet Chew 1 tablet by mouth 3 (three) times daily as needed for heartburn.     . cloNIDine (CATAPRES) 0.2 MG tablet Take 0.2 mg by mouth 2 (two) times daily.    . ferrous sulfate 325 (65 FE) MG tablet Take 1 tablet (325 mg total) by mouth 2 (two) times daily with a meal. 60 tablet 3  . gabapentin (NEURONTIN) 300 MG capsule TAKE 1 CAPSULE BY MOUTH THREE TIMES DAILY 90 capsule 0  . glucose blood (ACCU-CHEK AVIVA) test strip Check sugars TID for E11.65 100 each 12  . hydrALAZINE (APRESOLINE) 25 MG tablet Take 4 tablets (100 mg total) by mouth 2 (two) times daily.    . Insulin Glargine (LANTUS SOLOSTAR) 100 UNIT/ML Solostar Pen Inject 12  Units into the skin daily at 10 pm. (Patient taking differently: Inject 10 Units into the skin daily. ) 5 pen PRN  . Lancet Devices (ACCU-CHEK SOFTCLIX) lancets Check sugars TID for E11.65 1 each 10  . methocarbamol (ROBAXIN) 500 MG tablet Take 1 tablet (500 mg total) by mouth every 6 (six) hours as needed for muscle spasms. 30 tablet 3  . omeprazole (PRILOSEC) 20 MG capsule Take 20 mg by mouth daily before breakfast.     . polyethylene glycol (MIRALAX / GLYCOLAX) packet Take 17 g by mouth 2 (two) times daily. (Patient taking differently: Take 17 g by mouth daily as needed for moderate constipation. ) 14 each 0  . sertraline (ZOLOFT) 100 MG tablet Take 1 tablet (100 mg total) by mouth daily. 30 tablet 6  . sodium bicarbonate 650 MG tablet Take 1,300 mg by mouth 3 (three) times daily.   3  . metolazone (ZAROXOLYN) 2.5 MG tablet Take 2.5 mg by mouth daily.  5  . metoprolol succinate (TOPROL-XL) 50 MG 24 hr tablet Take 1 tablet (50 mg total) by mouth 2 (two) times daily. (Patient not taking: Reported on 06/17/2018) 60 tablet 0  . Multiple Vitamin (MULTIVITAMIN WITH MINERALS) TABS tablet Take 1 tablet by mouth daily.     No current facility-administered medications for this visit.      REVIEW OF SYSTEMS: see HPI for pertinent positives and negatives    PHYSICAL EXAMINATION:  Vitals:   06/17/18 1121  BP: (!) 167/73  Pulse: 88  Resp: 20  Temp: 99.1 F (37.3 C)  SpO2: 96%  Weight: 142 lb (64.4 kg)  Height: '5\' 7"'  (1.702 m)   Body mass index is 22.24 kg/m.  General: The patient appears her stated age, seated in her w/c.   HEENT:  No gross abnormalities Pulmonary: Respirations are non-labored Abdomen: Soft and non-tender  Musculoskeletal: Left AKA. Neurologic: No focal weakness detected Skin: There are no ulcer or rashes noted. Psychiatric: The patient has normal affect. Cardiovascular: There is a regular rate and rhythm with murmur appreciated. Bilateral radial pulses are 2+ palpable.  Right upper arm AVF with palpable thrill and audible bruit. Small hematoma at proximal area of  AVF.   Medical Decision Making  MALAKA RUFFNER is a 63 y.o. female who is s/p rightsecond stage basilic vein transposition (brachiobasilic arteriovenous fistula) placement on 02-01-18.  She returns today with c/o difficult cannulation of right upper arm AVF, and small hematoma at proximal end of AVF.   She started hemodialysis about October or November 2019. She has some successful HD runs, about half.  She dialyzes on T[facility]TH-S at [facility], next door.    I discussed with Dr. Scot Dock pt HPI and physical exam results. Pt needs an access duplex ASAP, tomorrow hopefully, see Dr. Carlis Abbott afterward. She is scheduled for HD now, today, next door.      Clemon Chambers, RN, MSN, FNP-C [facility] Office: [phone]  06/17/2018, 11:44 AM  Clinic MD: Scot Dock in vein clinic, Early on call

## 2018-06-18 ENCOUNTER — Other Ambulatory Visit: Payer: Self-pay

## 2018-06-18 DIAGNOSIS — N186 End stage renal disease: Secondary | ICD-10-CM

## 2018-06-18 DIAGNOSIS — Z992 Dependence on renal dialysis: Secondary | ICD-10-CM

## 2018-06-18 DIAGNOSIS — T82590A Other mechanical complication of surgically created arteriovenous fistula, initial encounter: Secondary | ICD-10-CM

## 2018-06-18 DIAGNOSIS — N184 Chronic kidney disease, stage 4 (severe): Secondary | ICD-10-CM

## 2018-06-19 DIAGNOSIS — Z23 Encounter for immunization: Secondary | ICD-10-CM | POA: Diagnosis not present

## 2018-06-19 DIAGNOSIS — D631 Anemia in chronic kidney disease: Secondary | ICD-10-CM | POA: Diagnosis not present

## 2018-06-19 DIAGNOSIS — E1129 Type 2 diabetes mellitus with other diabetic kidney complication: Secondary | ICD-10-CM | POA: Diagnosis not present

## 2018-06-19 DIAGNOSIS — N186 End stage renal disease: Secondary | ICD-10-CM | POA: Diagnosis not present

## 2018-06-19 DIAGNOSIS — D509 Iron deficiency anemia, unspecified: Secondary | ICD-10-CM | POA: Diagnosis not present

## 2018-06-19 DIAGNOSIS — N2581 Secondary hyperparathyroidism of renal origin: Secondary | ICD-10-CM | POA: Diagnosis not present

## 2018-06-21 ENCOUNTER — Other Ambulatory Visit (INDEPENDENT_AMBULATORY_CARE_PROVIDER_SITE_OTHER): Payer: Self-pay | Admitting: Orthopedic Surgery

## 2018-06-21 NOTE — Telephone Encounter (Signed)
Last office visit 03/2018 s/p left AKA do you want to refill?

## 2018-06-22 DIAGNOSIS — E1129 Type 2 diabetes mellitus with other diabetic kidney complication: Secondary | ICD-10-CM | POA: Diagnosis not present

## 2018-06-22 DIAGNOSIS — D509 Iron deficiency anemia, unspecified: Secondary | ICD-10-CM | POA: Diagnosis not present

## 2018-06-22 DIAGNOSIS — Z23 Encounter for immunization: Secondary | ICD-10-CM | POA: Diagnosis not present

## 2018-06-22 DIAGNOSIS — N2581 Secondary hyperparathyroidism of renal origin: Secondary | ICD-10-CM | POA: Diagnosis not present

## 2018-06-22 DIAGNOSIS — D631 Anemia in chronic kidney disease: Secondary | ICD-10-CM | POA: Diagnosis not present

## 2018-06-22 DIAGNOSIS — N186 End stage renal disease: Secondary | ICD-10-CM | POA: Diagnosis not present

## 2018-06-24 DIAGNOSIS — Z23 Encounter for immunization: Secondary | ICD-10-CM | POA: Diagnosis not present

## 2018-06-24 DIAGNOSIS — E1129 Type 2 diabetes mellitus with other diabetic kidney complication: Secondary | ICD-10-CM | POA: Diagnosis not present

## 2018-06-24 DIAGNOSIS — D631 Anemia in chronic kidney disease: Secondary | ICD-10-CM | POA: Diagnosis not present

## 2018-06-24 DIAGNOSIS — N2581 Secondary hyperparathyroidism of renal origin: Secondary | ICD-10-CM | POA: Diagnosis not present

## 2018-06-24 DIAGNOSIS — N186 End stage renal disease: Secondary | ICD-10-CM | POA: Diagnosis not present

## 2018-06-24 DIAGNOSIS — D509 Iron deficiency anemia, unspecified: Secondary | ICD-10-CM | POA: Diagnosis not present

## 2018-06-26 DIAGNOSIS — E1129 Type 2 diabetes mellitus with other diabetic kidney complication: Secondary | ICD-10-CM | POA: Diagnosis not present

## 2018-06-26 DIAGNOSIS — N186 End stage renal disease: Secondary | ICD-10-CM | POA: Diagnosis not present

## 2018-06-26 DIAGNOSIS — Z23 Encounter for immunization: Secondary | ICD-10-CM | POA: Diagnosis not present

## 2018-06-26 DIAGNOSIS — D631 Anemia in chronic kidney disease: Secondary | ICD-10-CM | POA: Diagnosis not present

## 2018-06-26 DIAGNOSIS — D509 Iron deficiency anemia, unspecified: Secondary | ICD-10-CM | POA: Diagnosis not present

## 2018-06-26 DIAGNOSIS — N2581 Secondary hyperparathyroidism of renal origin: Secondary | ICD-10-CM | POA: Diagnosis not present

## 2018-06-27 ENCOUNTER — Other Ambulatory Visit (INDEPENDENT_AMBULATORY_CARE_PROVIDER_SITE_OTHER): Payer: Self-pay | Admitting: Orthopaedic Surgery

## 2018-06-28 ENCOUNTER — Encounter: Payer: Self-pay | Admitting: Physical Therapy

## 2018-06-28 ENCOUNTER — Ambulatory Visit: Payer: Medicare Other | Attending: Orthopedic Surgery | Admitting: Physical Therapy

## 2018-06-28 ENCOUNTER — Other Ambulatory Visit: Payer: Self-pay

## 2018-06-28 DIAGNOSIS — R2681 Unsteadiness on feet: Secondary | ICD-10-CM

## 2018-06-28 DIAGNOSIS — Z9181 History of falling: Secondary | ICD-10-CM | POA: Diagnosis not present

## 2018-06-28 DIAGNOSIS — M6249 Contracture of muscle, multiple sites: Secondary | ICD-10-CM

## 2018-06-28 DIAGNOSIS — R262 Difficulty in walking, not elsewhere classified: Secondary | ICD-10-CM | POA: Diagnosis not present

## 2018-06-28 DIAGNOSIS — M6281 Muscle weakness (generalized): Secondary | ICD-10-CM

## 2018-06-28 DIAGNOSIS — R2689 Other abnormalities of gait and mobility: Secondary | ICD-10-CM | POA: Diagnosis not present

## 2018-06-28 DIAGNOSIS — M79604 Pain in right leg: Secondary | ICD-10-CM

## 2018-06-28 DIAGNOSIS — M79605 Pain in left leg: Secondary | ICD-10-CM

## 2018-06-28 DIAGNOSIS — R293 Abnormal posture: Secondary | ICD-10-CM | POA: Diagnosis not present

## 2018-06-28 NOTE — Telephone Encounter (Signed)
Ok for refill? 

## 2018-06-29 DIAGNOSIS — E1129 Type 2 diabetes mellitus with other diabetic kidney complication: Secondary | ICD-10-CM | POA: Diagnosis not present

## 2018-06-29 DIAGNOSIS — D631 Anemia in chronic kidney disease: Secondary | ICD-10-CM | POA: Diagnosis not present

## 2018-06-29 DIAGNOSIS — D509 Iron deficiency anemia, unspecified: Secondary | ICD-10-CM | POA: Diagnosis not present

## 2018-06-29 DIAGNOSIS — N186 End stage renal disease: Secondary | ICD-10-CM | POA: Diagnosis not present

## 2018-06-29 DIAGNOSIS — N2581 Secondary hyperparathyroidism of renal origin: Secondary | ICD-10-CM | POA: Diagnosis not present

## 2018-06-29 DIAGNOSIS — Z23 Encounter for immunization: Secondary | ICD-10-CM | POA: Diagnosis not present

## 2018-06-29 NOTE — Therapy (Signed)
[facility] [address] [place], [place], [address] Phone: [phone]   Fax:  [phone]  Physical Therapy Evaluation  Patient Details  Name: Brandy Houston MRN: [id number] Date of Birth: 09/19/1955 Referring Provider (PT): Meridee Score, MD   Encounter Date: 06/28/2018  PT End of Session - 06/28/18 1521    Visit Number  1    Number of Visits  24    Date for PT Re-Evaluation  09/26/18    Authorization Type  Medicare & Medicaid    PT Start Time  0933    PT Stop Time  1015    PT Time Calculation (min)  42 min    Equipment Utilized During Treatment  Gait belt    Activity Tolerance  Patient tolerated treatment well;Patient limited by fatigue    Behavior During Therapy  [facility] for tasks assessed/performed       Past Medical History:  Diagnosis Date  . Anemia   . Anxiety   . Arthritis    "in my joints" (03/10/2017)  . Chronic diastolic CHF (congestive heart failure) ([place])   . CKD (chronic kidney disease), stage IV ([place])    stage IV. previous HD, none currently 10/30/15 (confirmed 02/05/2017 & 03/10/2017)  . Depression    Chronic  . Diabetic peripheral neuropathy ([place])   . Dysrhythmia    tachycardia, normal ECHO 08-09-14  . GERD (gastroesophageal reflux disease)   . Heart murmur     dx'd 02/05/2017  . Hepatitis C    "tx'd in 2016; I'm negative now" (02/05/2017)  . History of blood transfusion 2017   "w/knee replacement"  . Hypertension   . Osteoarthritis of left knee   . Peripheral neuropathy   . Protein calorie malnutrition ([place])   . Septic arthritis ([place])   . Slow transit constipation   . Type II diabetes mellitus ([name])    IDDM  . Uncontrolled hypertension 02/18/2013  . Unsteady gait     Past Surgical History:  Procedure Laterality Date  . AMPUTATION Left 03/20/2017   Procedure: LEFT ABOVE KNEE AMPUTATION;  Surgeon: Newt Minion, MD;  Location: [facility];  Service: Orthopedics;  Laterality: Left;  . APPENDECTOMY     . AV FISTULA PLACEMENT Left 09/13/2014   Procedure: Brachial Artery to Brachial Vein Gortex Four - Seven Stretch GRAFT INSERTION Left Forearm;  Surgeon: Mal Misty, MD;  Location: [facility];  Service: Vascular;  Laterality: Left;  . BASCILIC VEIN TRANSPOSITION Right 10/26/2017   Procedure: BASILIC VEIN TRANSPOSITION FIRST STAGE RIGHT ARM;  Surgeon: Conrad Allen, MD;  Location: [facility];  Service: Vascular;  Laterality: Right;  . BASCILIC VEIN TRANSPOSITION Right 02/01/2018   Procedure: SECOND STAGE BASILIC VEIN TRANSPOSITION RIGHT UPPER EXTREMITY;  Surgeon: Marty Heck, MD;  Location: [facility];  Service: Vascular;  Laterality: Right;  . CHOLECYSTECTOMY OPEN    . COLON SURGERY    . EXCISIONAL TOTAL KNEE ARTHROPLASTY WITH ANTIBIOTIC SPACERS Left 02/06/2017   Procedure: Incisional total iknee with antibiotic spacer ;  Surgeon: Leandrew Koyanagi, MD;  Location: [facility];  Service: Orthopedics;  Laterality: Left;  . IR FLUORO GUIDE CV LINE RIGHT  02/10/2017  . IR REMOVAL TUN CV CATH W/O FL  04/01/2017  . IR US GUIDE VASC ACCESS RIGHT  02/10/2017  . IRRIGATION AND DEBRIDEMENT KNEE Left 03/12/2017   Procedure: IRRIGATION AND DEBRIDEMENT LEFT KNEE WITH WOUND VAC APPLICATION;  Surgeon: Leandrew Koyanagi, MD;  Location: [facility];  Service: Orthopedics;  Laterality: Left;  . JOINT REPLACEMENT    . KNEE ARTHROSCOPY Right 08/10/2014   Procedure: ARTHROSCOPY I & D KNEE;  Surgeon: Marianna Payment, MD;  Location: [facility];  Service: Orthopedics;  Laterality: Right;  . KNEE ARTHROSCOPY Left 08/11/2014   Procedure: ARTHROSCOPIC WASHOUT LEFT KNEE;  Surgeon: Marianna Payment, MD;  Location: [facility];  Service: Orthopedics;  Laterality: Left;  . KNEE ARTHROSCOPY Left 08/19/2014   Procedure: ARTHROSCOPIC WASHOUT LEFT KNEE;  Surgeon: Leandrew Koyanagi, MD;  Location: [facility];  Service: Orthopedics;  Laterality: Left;  . KNEE ARTHROSCOPY WITH LATERAL MENISECTOMY Left 04/04/2015   Procedure: AND PARTIAL LATERAL MENISECTOMY;  Surgeon: Leandrew Koyanagi, MD;  Location: [facility];  Service: Orthopedics;  Laterality: Left;  . KNEE ARTHROSCOPY WITH MEDIAL MENISECTOMY Left 04/04/2015   Procedure: LEFT KNEE ARTHROSCOPY WITH PARTIAL MEDIAL MENISCECTOMY  AND SYNOVECTOMY;  Surgeon: Leandrew Koyanagi, MD;  Location: [facility];  Service: Orthopedics;  Laterality: Left;  . SHOULDER ARTHROSCOPY Bilateral 08/10/2014   Procedure: I & D BILATERAL SHOULDERS ;  Surgeon: Marianna Payment, MD;  Location: [facility];  Service: Orthopedics;  Laterality: Bilateral;  . SMALL INTESTINE SURGERY     Due to Small Bowel Obstruction; "fixed it when they did my gallbladder OR"  . TEE WITHOUT CARDIOVERSION N/A 08/14/2014   Procedure: TRANSESOPHAGEAL ECHOCARDIOGRAM (TEE);  Surgeon: Thayer Headings, MD;  Location: [facility];  Service: Cardiovascular;  Laterality: N/A;  . TENOSYNOVECTOMY Right 08/11/2014   Procedure: RIGHT WRIST IRRIGATION AND DEBRIDEMENT, TENOSYNOVECTOMY;  Surgeon: Marianna Payment, MD;  Location: [facility];  Service: Orthopedics;  Laterality: Right;  . TOTAL KNEE ARTHROPLASTY Left 11/07/2015   Procedure: LEFT TOTAL KNEE ARTHROPLASTY WITH REVISION OF IMPLANTS;  Surgeon: Leandrew Koyanagi, MD;  Location: [facility];  Service: Orthopedics;  Laterality: Left;  . TUBAL LIGATION      There were no vitals filed for this visit.   Subjective Assessment - 06/28/18 0940    Subjective  This 63yo female was referred on 04/08/2018 to PT by [name], PA / Meridee Score, MD. She started dialysis October 2019. She had a left Total Knee Arthroplasty on 11/07/2015 that became infected & she underwent a left Transfemoral Amputation on 03/20/17. She recieved first prosthesis 09/30/1017. She got socket revision 05/26/2018 with different suspension type.     Pertinent History  left TFA, DM, neuropathy, Hep C, HTN, CHF, depression, CKD st 5 not dialysis, arthritis, heart murmur,     Limitations  Standing;Walking;House hold activities    Patient Stated Goals  To  walk with prosthesis in home & community, travel plane, car & cruise in September    Currently in Pain?  No/denies         [facility] PT Assessment - 06/28/18 0930      Assessment   Medical Diagnosis  Left Transfemoral Amputation    Referring Provider (PT)  Meridee Score, MD    Onset Date/Surgical Date  05/26/18   socket revision delivery   Hand Dominance  Right    Prior Therapy  31 PT  visits 10/07/2017-03/25/2018      Precautions   Precautions  Fall    Precaution Comments  NO BP RUE      Restrictions   Weight Bearing Restrictions  No      Balance Screen   Has the patient fallen in the past 6 months  Yes    How many times?  2   no prosthesis  on limb & w/c moved when transfering   Has the patient had a decrease in activity level because of a fear of falling?   No    Is the patient reluctant to leave their home because of a fear of falling?   No      Home Environment   Living Environment  Private residence    Living Arrangements  Children   14yo son, his girlfriend, 15yo & 37yo children   Type of [place] to enter    Entrance Stairs-Number of Steps  3    Entrance Stairs-Rails  Left    [place]  Two level;Able to live on main level with bedroom/bathroom;Full bath on main level    Alternate Level Stairs-Number of Steps  14    Alternate Level Stairs-Rails  None    Home Equipment  Walker - 2 wheels;Walker - 4 wheels;Cane - single point;Bedside commode;Tub bench;Wheelchair - manual;Crutches      Prior Function   Level of Independence  Independent   has not been walking for 1-2 months b/c socket too big   Vocation  On disability    Leisure  traveling,       Posture/Postural Control   Posture/Postural Control  Postural limitations    Postural Limitations  Rounded Shoulders;Forward head;Flexed trunk;Weight shift right      Strength   Overall Strength  Deficits    Overall Strength Comments  BUEs gross testing ~4/5    Right Hip Flexion  4/5    Right  Hip Extension  3+/5    Right Hip ABduction  3+/5    Left Hip Flexion  4/5    Left Hip Extension  3-/5    Left Hip ABduction  3/5    Right/Left Knee  Right    Right Knee Flexion  4/5    Right Knee Extension  5/5    Right Ankle Dorsiflexion  4/5      Transfers   Transfers  Sit to Stand;Stand to Sit    Sit to Stand  5: Supervision;With upper extremity assist;With armrests;From chair/3-in-1   to RW for stabilization   Stand to Sit  5: Supervision;With upper extremity assist;With armrests;To chair/3-in-1   from RW for stability     Ambulation/Gait   Ambulation/Gait  Yes    Ambulation/Gait Assistance  5: Supervision    Ambulation/Gait Assistance Details  excessive UE weight bearing on RW with partial weight on prosthesis    Ambulation Distance (Feet)  75 Feet    Assistive device  Rolling walker;Prosthesis    Gait Pattern  Step-through pattern;Decreased step length - right;Decreased stance time - left;Decreased hip/knee flexion - left;Decreased weight shift to left;Left circumduction;Left hip hike;Antalgic;Lateral hip instability;Trunk flexed   prosthesis adduction   Ambulation Surface  Indoor;Level    Gait velocity  0.83 ft/sec      Berg Balance Test   Sit to Stand  Needs minimal aid to stand or to stabilize   with RW support = 3   Standing Unsupported  Able to stand 30 seconds unsupported   with RW support = 4   Sitting with Back Unsupported but Feet Supported on Floor or Stool  Able to sit safely and securely 2 minutes    Stand to Sit  Sits independently, has uncontrolled descent   with RW support = 3   Transfers  Able to transfer safely, definite need of hands    Standing Unsupported with Eyes Closed  Unable to keep eyes closed 3 seconds but stays steady   with RW support = 3   Standing Ubsupported with Feet Together  Needs help to attain position but able to stand for 30 seconds with feet together   with RW support = 3   From Standing, Reach Forward with Outstretched Arm   Reaches forward but needs supervision   with RW support = 3   From Standing Position, Pick up Object from Floor  Unable to pick up and needs supervision   with RW support = 4   From Standing Position, Turn to Look Behind Over each Shoulder  Needs supervision when turning   with RW support = 3   Turn 360 Degrees  Needs assistance while turning   with RW support = 1   Standing Unsupported, Alternately Place Feet on Step/Stool  Needs assistance to keep from falling or unable to try   with RW support = 1   Standing Unsupported, One Foot in [employer] balance while stepping or standing   with RW support = 2   Standing on One Leg  Unable to try or needs assist to prevent fall   with RW support = 3   Total Score  16    Berg comment:  today (06/28/2018) Merrilee Jansky Tasks w/RW support 40/56 & on 03/23/2018 Merrilee Jansky Tasks with RW 51/56      Prosthetics Assessment - 06/28/18 0930      Prosthetics   Prosthetic Care Dependent with  Skin check;Residual limb care;Prosthetic cleaning;Correct ply sock adjustment;Proper wear schedule/adjustment    Donning prosthesis   Min assist   new suspension   Doffing prosthesis   Modified independent (Device/Increase time)    Current prosthetic wear tolerance (days/week)   reports ~4 days /wk when first got new socket 05/26/2018 but less recently as issues with maintaining suspension    Current prosthetic wear tolerance (#hours/day)   up to 8 hrs on nondialysis days and often no wear on dialysis days.  She has dialysis Tu, Th & Sat from ~12pm to 4-5pm and gets home 6-7pm.  She gets up ~6am daily.     Current prosthetic weight-bearing tolerance (hours/day)   Patient reports limb pain 5/10 with donning liner and standing activities.     Edema  pitting with 3-4 sec refill    Residual limb condition   intact with no open areas, incision invaginated, normal color & temperature, slight dry skin, cyliderical shape    K code/activity level with prosthetic use   K 2 basic community  with fixed cadence.  Patient has silicon liner with suction ring suspension, multiaxial knee and single axis foot.                Objective measurements completed on examination: See above findings.      [facility] Adult PT Treatment/Exercise - 06/28/18 0930      Prosthetics   Prosthetic Care Comments   Prosthesis is >1/2" too short and patient stands with RLE knee flexed to compensate.  Prosthesis was not suspended initially. PT instructed in proper donning while donning prosthesis. She had good suspension for standing balance test and ambulating with RW. When PT placed lifts under prosthesis to determine height difference she lost suspension to point that she required modA to prevent fall.  PT called prosthetist to discuss suspension, height issue & recommend rotator added to prosthesis. Prosthetist to see pt next Tuesday morning prior to dialysis so her limb will be at largest size based  on dialysis days and plans to fit with minimal socks at that limb size. He has additional prosthetic socks to deliver.     Education Provided  Skin check;Residual limb care;Prosthetic cleaning;Correct ply sock adjustment;Proper Donning;Proper wear schedule/adjustment;Other (comment)   see prosthetic care comments   Person(s) Educated  Patient    Education Method  Explanation;Demonstration;Tactile cues;Verbal cues    Education Method  Verbalized understanding;Verbal cues required;Needs further instruction             PT Education - 06/28/18 1010    Education provided  Yes    Education Details  prosthetic appointment on Tuesday morning prior to dialysis so able to fit socket at her largest limb volume size.     Person(s) Educated  Patient    Methods  Explanation;Verbal cues    Comprehension  Verbalized understanding       PT Short Term Goals - 06/28/18 1200      PT SHORT TERM GOAL #1   Title  Patient demonstrates proper donning of prosthesis with new suction ring suspension with supervision.  (All STGs Target Date: 07/30/2018)    Time  1    Period  Months    Status  New    Target Date  07/30/18      PT SHORT TERM GOAL #2   Title  patient reports prosthesis wear daily >/= 8 hrs/day on nondialysis days and >/= 3hrs/day on dialysis days    Time  1    Period  Months    Status  New    Target Date  07/30/18      PT SHORT TERM GOAL #3   Title  Patient able to reach 10" anteriorly and to floor with RW support with prosthesis safely.     Time  1    Period  Months    Status  New    Target Date  07/30/18      PT SHORT TERM GOAL #4   Title  Patient ambulates 200' with RW & prosthesis with supervision.     Time  1    Period  Months    Status  New    Target Date  07/30/18        PT Long Term Goals - 06/28/18 1200      PT LONG TERM GOAL #1   Title  Patient verbalizes & demonstrates proper prosthetic care with new prosthetic design including prosthesis use at dialysis to enable safe use of prosthesis. (All LTGs Target Date: 09/26/2018)    Time  3    Period  Months    Status  New    Target Date  09/26/18      PT LONG TERM GOAL #2   Title  Patient tolerates & reports prosthesis wear daily >75% of awake hours without skin issues or limb pain.     Time  3    Period  Months    Status  New    Target Date  09/26/18      PT LONG TERM GOAL #3   Title  Patient ambulates 500' with one seated rest <3 minutes outdoors with rollator walker & prosthesis modified independent for community mobility.     Time  3    Period  Months    Status  New    Target Date  09/26/18      PT LONG TERM GOAL #4   Title  Patient negotiates ramps, curbs with rollator walker & stairs 1 rail/cane & prosthesis modified  independent to enable community mobility.     Time  3    Period  Months    Status  New    Target Date  09/26/18      PT LONG TERM GOAL #5   Title  Improve standing balance with tasks of Berg Balance Test with walker support >50/56    Time  3    Period  Months    Status  New     Target Date  09/26/18             Plan - 06/28/18 1524    Clinical Impression Statement  This 63yo female was ambulatory at community level with rollator walker & prosthesis in October 2019 when she was discharged. Her socket became too large about same time that she started dialysis. She stopped wearing her prosthesis for 2 months until new socket was delivered 05/27/2019. The new socket has different suspension type that patient is dependent in donning, wear & care. Her prosthesis appears >1/2" too short resulting in standing balance with right knee flexed. Her balance decreased since October with PT performing tasks of Berg like reaching, etc with RW support. In October 2019, tasks were 51/56 and today 40/56. She is dependent in prosthetic gait with RW with deviations & gait velocity indicating high fall risk. Patient's prosthesis lost suction / suspension during gait requiring modA. Patient needs to see prosthetist just prior to dialysis on Tuesday (largest size with 3 days since last dialysis session) to adjust socket fit, adjust height & add rotator. Patient appears would benefit from additional skilled care to progress safety, balance, gait & prosthetic understanding.     History and Personal Factors relevant to plan of care:  left TFA, ESRD with dialysis since 03/2018, DM, neuropathy, HepC, HTN, CHF, arthritis, heart murmur    Clinical Presentation  Evolving    Clinical Presentation due to:  new to dialysis, dependent in new prosthetic components and use of prosthesis at dialysis, high fall risk, DM, neuropathy, CHF    Clinical Decision Making  Moderate    Rehab Potential  Good    PT Frequency  2x / week    PT Duration  12 weeks    PT Treatment/Interventions  ADLs/Self Care Home Management;Moist Heat;DME Instruction;Gait training;Stair training;Functional mobility training;Therapeutic activities;Therapeutic exercise;Balance training;Neuromuscular re-education;Patient/family  education;Prosthetic Training;Manual techniques;Vestibular    PT Next Visit Plan  review prosthetic care, check height, educate on rotator, HEP at sink       Patient will benefit from skilled therapeutic intervention in order to improve the following deficits and impairments:  Abnormal gait, Decreased activity tolerance, Decreased balance, Decreased endurance, Decreased knowledge of use of DME, Decreased mobility, Decreased strength, Decreased range of motion, Impaired flexibility, Prosthetic Dependency, Postural dysfunction, Increased edema, Dizziness  Visit Diagnosis: Other abnormalities of gait and mobility  Muscle weakness (generalized)  Abnormal posture  Unsteadiness on feet  Contracture of muscle, multiple sites  Pain in left leg  Pain in right leg  History of falling     Problem List Patient Active Problem List   Diagnosis Date Noted  . Benign essential HTN   . Stage 5 chronic kidney disease not on chronic dialysis ([place])   . Above knee amputation status, left   . DM type 2 causing CKD stage 5 ([place]) 03/21/2017  . Anemia of chronic disease 02/05/2017  . Dysplasia of cervix, high grade CIN 2 01/12/2017  . Chronic diastolic CHF (congestive heart failure) ([place]) 11/08/2015  . Total knee replacement  status   . Diabetic peripheral neuropathy ([place])   . Major depression, recurrent, chronic ([place])   . Chronic kidney disease (CKD), stage IV (severe) ([place])   . Hepatitis C 08/16/2014  . Peripheral neuropathy 06/24/2013  . Compulsive tobacco user syndrome 06/24/2013    Jamey Reas PT, DPT 06/29/2018, 6:20 AM  [facility] [address] [place], [place], [address] Phone: [phone]   Fax:  [phone]  Name: Brandy Houston MRN: [id number] Date of Birth: September 30, 1955

## 2018-06-30 ENCOUNTER — Ambulatory Visit (HOSPITAL_COMMUNITY)
Admission: RE | Admit: 2018-06-30 | Discharge: 2018-06-30 | Disposition: A | Discharge status: Home / Self Care | Payer: No Typology Code available for payment source | Source: Ambulatory Visit | Attending: Family | Admitting: Family

## 2018-06-30 ENCOUNTER — Encounter: Payer: Self-pay | Admitting: *Deleted

## 2018-06-30 ENCOUNTER — Ambulatory Visit (INDEPENDENT_AMBULATORY_CARE_PROVIDER_SITE_OTHER): Payer: Medicare Other | Admitting: Vascular Surgery

## 2018-06-30 ENCOUNTER — Other Ambulatory Visit: Payer: Self-pay | Admitting: *Deleted

## 2018-06-30 ENCOUNTER — Encounter: Payer: Self-pay | Admitting: Vascular Surgery

## 2018-06-30 ENCOUNTER — Other Ambulatory Visit: Payer: Self-pay

## 2018-06-30 VITALS — BP 146/70 | HR 69 | Temp 97.7°F | Resp 14 | Ht 67.0 in | Wt 130.0 lb

## 2018-06-30 DIAGNOSIS — Z992 Dependence on renal dialysis: Secondary | ICD-10-CM

## 2018-06-30 DIAGNOSIS — T82590A Other mechanical complication of surgically created arteriovenous fistula, initial encounter: Secondary | ICD-10-CM | POA: Diagnosis not present

## 2018-06-30 DIAGNOSIS — N184 Chronic kidney disease, stage 4 (severe): Secondary | ICD-10-CM | POA: Insufficient documentation

## 2018-06-30 DIAGNOSIS — N186 End stage renal disease: Secondary | ICD-10-CM | POA: Diagnosis not present

## 2018-06-30 NOTE — Progress Notes (Signed)
Patient name: Brandy Houston MRN: [id number] DOB: 12-07-1955 Sex: female  REASON FOR VISIT:   Follow-up of right basilic vein transposition  HPI:   Brandy Houston is a pleasant 63 y.o. female who had a second stage basilic vein transposition performed by Dr. Fortunato Curling on 02/01/2018.  She was seen by the nurse practitioner and apparently they were having problems cannulating the fistula.  I recommended that the patient get a duplex scan and the patient returns today to get her duplex scan and for an office evaluation.  She tells me that she is having some problems with bleeding when they remove the needles after dialysis.  She also states that the fistula has been "clotting" some.  She denies pain or paresthesias in her right arm.  She dialyzes on Tuesdays Thursdays and Saturdays.  Current Outpatient Medications  Medication Sig Dispense Refill  . amLODipine (NORVASC) 10 MG tablet Take 1 tablet (10 mg total) by mouth at bedtime. (Patient taking differently: Take 5 mg by mouth at bedtime. ) 30 tablet 0  . aspirin EC 81 MG tablet Take 81 mg by mouth daily.    [name] atorvastatin (LIPITOR) 10 MG tablet Take 1 tablet (10 mg total) by mouth at bedtime. 90 tablet 3  . Blood Glucose Monitoring Suppl (ACCU-CHEK AVIVA PLUS) W/DEVICE KIT Check sugars TID for E11.65 1 kit 0  . calcitRIOL (ROCALTROL) 0.25 MCG capsule Take 1 capsule (0.25 mcg total) by mouth every Monday, Wednesday, and Friday. (Patient taking differently: Take 0.25 mcg by mouth daily. ) 90 capsule 0  . cloNIDine (CATAPRES) 0.2 MG tablet Take 0.2 mg by mouth 2 (two) times daily.    [name] gabapentin (NEURONTIN) 300 MG capsule TAKE 1 CAPSULE BY MOUTH THREE TIMES DAILY 90 capsule 0  . glucose blood (ACCU-CHEK AVIVA) test strip Check sugars TID for E11.65 100 each 12  . hydrALAZINE (APRESOLINE) 25 MG tablet Take 4 tablets (100 mg total) by mouth 2 (two) times daily.    . Insulin Glargine (LANTUS SOLOSTAR) 100 UNIT/ML Solostar Pen Inject 12 Units into  the skin daily at 10 pm. (Patient taking differently: Inject 10 Units into the skin daily. ) 5 pen PRN  . Lancet Devices (ACCU-CHEK SOFTCLIX) lancets Check sugars TID for E11.65 1 each 10  . methocarbamol (ROBAXIN) 500 MG tablet TAKE 1 TABLET BY MOUTH EVERY 6 HOURS AS NEEDED FOR MUSCLE SPASM 30 tablet 0  . metolazone (ZAROXOLYN) 2.5 MG tablet Take 2.5 mg by mouth daily.  5  . metoprolol succinate (TOPROL-XL) 50 MG 24 hr tablet Take 1 tablet (50 mg total) by mouth 2 (two) times daily. 60 tablet 0  . Multiple Vitamin (MULTIVITAMIN WITH MINERALS) TABS tablet Take 1 tablet by mouth daily.    [name] omeprazole (PRILOSEC) 20 MG capsule Take 20 mg by mouth daily before breakfast.     . polyethylene glycol (MIRALAX / GLYCOLAX) packet Take 17 g by mouth 2 (two) times daily. (Patient taking differently: Take 17 g by mouth daily as needed for moderate constipation. ) 14 each 0  . sertraline (ZOLOFT) 100 MG tablet Take 1 tablet (100 mg total) by mouth daily. 30 tablet 6  . calcium carbonate (TUMS EX) 750 MG chewable tablet Chew 1 tablet by mouth 3 (three) times daily as needed for heartburn.     . ferrous sulfate 325 (65 FE) MG tablet Take 1 tablet (325 mg total) by mouth 2 (two) times daily with a meal. (Patient not taking: Reported on 06/30/2018)  60 tablet 3  . sodium bicarbonate 650 MG tablet Take 1,300 mg by mouth 3 (three) times daily.   3   No current facility-administered medications for this visit.     REVIEW OF SYSTEMS:  _0  denotes positive finding, _1  denotes negative finding Vascular    Leg swelling    Cardiac    Chest pain or chest pressure:    Shortness of breath upon exertion:    Short of breath when lying flat:    Irregular heart rhythm:    Constitutional    Fever or chills:     PHYSICAL EXAM:   Vitals:   06/30/18 1301  BP: (!) 146/70  Pulse: 69  Resp: 14  Temp: 97.7 F (36.5 C)  TempSrc: Oral  SpO2: 98%  Weight: 130 lb (59 kg)  Height: _2  (1.702 m)    GENERAL: The  patient is a well-nourished female, in no acute distress. The vital signs are documented above. CARDIOVASCULAR: There is a regular rate and rhythm. PULMONARY: There is good air exchange bilaterally without wheezing or rales. VASCULAR: Her right upper arm fistula has an excellent thrill.  It is not pulsatile to suggest an outflow obstruction. She has a palpable right radial pulse.  DATA:   DUPLEX RIGHT UPPER ARM AV FISTULA: I have independently interpreted her duplex of her right basilic vein transposition.  The diameters of the fistula range from 0.61-0.95 cm.  There are 2 areas, 1 in the mid upper arm and one in the distal upper arm where there may be a retained valve.  There are elevated velocities at the proximal anastomosis and also some mildly elevated velocities in the proximal upper arm.  MEDICAL ISSUES:   STATUS POST RIGHT BASILIC VEIN TRANSPOSITION: The patient has a right basilic vein transposition which on exam has an excellent thrill.  However they have apparently been having problems with bleeding after dialysis and also some problems with "clotting" I would therefore recommend that we proceed with a fistulogram.  She dialyzes on Tuesdays Thursdays and Saturdays and she would like to have this done on a Wednesday so that her son can bring her.  We will schedule her for a fistulogram and possible venoplasty on Wednesday.  I have discussed the indications for the procedure and the potential complications and she is agreeable to proceed.  Deitra Mayo [facility] [phone]

## 2018-07-01 DIAGNOSIS — D509 Iron deficiency anemia, unspecified: Secondary | ICD-10-CM | POA: Diagnosis not present

## 2018-07-01 DIAGNOSIS — D631 Anemia in chronic kidney disease: Secondary | ICD-10-CM | POA: Diagnosis not present

## 2018-07-01 DIAGNOSIS — N2581 Secondary hyperparathyroidism of renal origin: Secondary | ICD-10-CM | POA: Diagnosis not present

## 2018-07-01 DIAGNOSIS — Z23 Encounter for immunization: Secondary | ICD-10-CM | POA: Diagnosis not present

## 2018-07-01 DIAGNOSIS — E1129 Type 2 diabetes mellitus with other diabetic kidney complication: Secondary | ICD-10-CM | POA: Diagnosis not present

## 2018-07-01 DIAGNOSIS — N186 End stage renal disease: Secondary | ICD-10-CM | POA: Diagnosis not present

## 2018-07-03 DIAGNOSIS — N186 End stage renal disease: Secondary | ICD-10-CM | POA: Diagnosis not present

## 2018-07-03 DIAGNOSIS — N2581 Secondary hyperparathyroidism of renal origin: Secondary | ICD-10-CM | POA: Diagnosis not present

## 2018-07-03 DIAGNOSIS — D631 Anemia in chronic kidney disease: Secondary | ICD-10-CM | POA: Diagnosis not present

## 2018-07-03 DIAGNOSIS — Z23 Encounter for immunization: Secondary | ICD-10-CM | POA: Diagnosis not present

## 2018-07-03 DIAGNOSIS — E1129 Type 2 diabetes mellitus with other diabetic kidney complication: Secondary | ICD-10-CM | POA: Diagnosis not present

## 2018-07-03 DIAGNOSIS — D509 Iron deficiency anemia, unspecified: Secondary | ICD-10-CM | POA: Diagnosis not present

## 2018-07-06 DIAGNOSIS — N186 End stage renal disease: Secondary | ICD-10-CM | POA: Diagnosis not present

## 2018-07-06 DIAGNOSIS — N2581 Secondary hyperparathyroidism of renal origin: Secondary | ICD-10-CM | POA: Diagnosis not present

## 2018-07-06 DIAGNOSIS — Z23 Encounter for immunization: Secondary | ICD-10-CM | POA: Diagnosis not present

## 2018-07-06 DIAGNOSIS — D509 Iron deficiency anemia, unspecified: Secondary | ICD-10-CM | POA: Diagnosis not present

## 2018-07-06 DIAGNOSIS — D631 Anemia in chronic kidney disease: Secondary | ICD-10-CM | POA: Diagnosis not present

## 2018-07-06 DIAGNOSIS — E1129 Type 2 diabetes mellitus with other diabetic kidney complication: Secondary | ICD-10-CM | POA: Diagnosis not present

## 2018-07-07 ENCOUNTER — Encounter (HOSPITAL_COMMUNITY)
Admission: RE | Disposition: A | Discharge status: Home / Self Care | Payer: Self-pay | Source: Home / Self Care | Attending: Vascular Surgery

## 2018-07-07 ENCOUNTER — Encounter (HOSPITAL_COMMUNITY): Payer: Self-pay | Admitting: Vascular Surgery

## 2018-07-07 ENCOUNTER — Ambulatory Visit (HOSPITAL_COMMUNITY)
Admission: RE | Admit: 2018-07-07 | Discharge: 2018-07-07 | Disposition: A | Discharge status: Home / Self Care | Payer: Medicare Other | Attending: Vascular Surgery | Admitting: Vascular Surgery

## 2018-07-07 ENCOUNTER — Other Ambulatory Visit: Payer: Self-pay

## 2018-07-07 DIAGNOSIS — Z79899 Other long term (current) drug therapy: Secondary | ICD-10-CM | POA: Insufficient documentation

## 2018-07-07 DIAGNOSIS — Y832 Surgical operation with anastomosis, bypass or graft as the cause of abnormal reaction of the patient, or of later complication, without mention of misadventure at the time of the procedure: Secondary | ICD-10-CM | POA: Insufficient documentation

## 2018-07-07 DIAGNOSIS — N186 End stage renal disease: Secondary | ICD-10-CM | POA: Diagnosis not present

## 2018-07-07 DIAGNOSIS — T82858A Stenosis of vascular prosthetic devices, implants and grafts, initial encounter: Secondary | ICD-10-CM | POA: Diagnosis not present

## 2018-07-07 DIAGNOSIS — Z794 Long term (current) use of insulin: Secondary | ICD-10-CM | POA: Insufficient documentation

## 2018-07-07 DIAGNOSIS — Z992 Dependence on renal dialysis: Secondary | ICD-10-CM | POA: Insufficient documentation

## 2018-07-07 DIAGNOSIS — Z7982 Long term (current) use of aspirin: Secondary | ICD-10-CM | POA: Diagnosis not present

## 2018-07-07 HISTORY — PX: PERIPHERAL VASCULAR BALLOON ANGIOPLASTY: CATH118281

## 2018-07-07 HISTORY — PX: A/V FISTULAGRAM: CATH118298

## 2018-07-07 LAB — POCT I-STAT 4, (NA,K, GLUC, HGB,HCT)
Glucose, Bld: 100 mg/dL — ABNORMAL HIGH (ref 70–99)
HCT: 37 % (ref 36.0–46.0)
Hemoglobin: 12.6 g/dL (ref 12.0–15.0)
Potassium: 5.3 mmol/L — ABNORMAL HIGH (ref 3.5–5.1)
Sodium: 138 mmol/L (ref 135–145)

## 2018-07-07 LAB — GLUCOSE, CAPILLARY
Glucose-Capillary: 116 mg/dL — ABNORMAL HIGH (ref 70–99)
Glucose-Capillary: 74 mg/dL (ref 70–99)

## 2018-07-07 LAB — POCT I-STAT CREATININE: Creatinine, Ser: 3.4 mg/dL — ABNORMAL HIGH (ref 0.44–1.00)

## 2018-07-07 SURGERY — A/V FISTULAGRAM
Anesthesia: LOCAL | Laterality: Right

## 2018-07-07 MED ORDER — DIPHENHYDRAMINE HCL 50 MG/ML IJ SOLN
25.0000 mg | INTRAMUSCULAR | Status: AC
  Administered 2018-07-07: 25 mg via INTRAVENOUS

## 2018-07-07 MED ORDER — FAMOTIDINE IN NACL 20-0.9 MG/50ML-% IV SOLN
20.0000 mg | INTRAVENOUS | Status: AC
  Administered 2018-07-07: 20 mg via INTRAVENOUS
  Filled 2018-07-07: qty 50

## 2018-07-07 MED ORDER — SODIUM CHLORIDE 0.9 % IV SOLN: 250.0000 mL | INTRAVENOUS | Status: DC | PRN

## 2018-07-07 MED ORDER — HEPARIN (PORCINE) IN NACL 1000-0.9 UT/500ML-% IV SOLN
INTRAVENOUS | Status: AC
  Filled 2018-07-07: qty 500

## 2018-07-07 MED ORDER — LIDOCAINE HCL (PF) 1 % IJ SOLN
INTRAMUSCULAR | Status: AC
  Filled 2018-07-07: qty 30

## 2018-07-07 MED ORDER — HEPARIN (PORCINE) IN NACL 1000-0.9 UT/500ML-% IV SOLN
INTRAVENOUS | Status: DC | PRN
  Administered 2018-07-07: 500 mL

## 2018-07-07 MED ORDER — METHYLPREDNISOLONE SODIUM SUCC 125 MG IJ SOLR
INTRAMUSCULAR | Status: AC
  Administered 2018-07-07: 125 mg via INTRAVENOUS
  Filled 2018-07-07: qty 2

## 2018-07-07 MED ORDER — SODIUM CHLORIDE 0.9% FLUSH: 3.0000 mL | Freq: Two times a day (BID) | INTRAVENOUS | Status: DC

## 2018-07-07 MED ORDER — SODIUM CHLORIDE 0.9% FLUSH: 3.0000 mL | INTRAVENOUS | Status: DC | PRN

## 2018-07-07 MED ORDER — HEPARIN SODIUM (PORCINE) 1000 UNIT/ML IJ SOLN
INTRAMUSCULAR | Status: DC | PRN
  Administered 2018-07-07: 3000 [IU] via INTRAVENOUS

## 2018-07-07 MED ORDER — IODIXANOL 320 MG/ML IV SOLN
INTRAVENOUS | Status: DC | PRN
  Administered 2018-07-07: 55 mL via INTRAVENOUS

## 2018-07-07 MED ORDER — METHYLPREDNISOLONE SODIUM SUCC 125 MG IJ SOLR
125.0000 mg | INTRAMUSCULAR | Status: AC
  Administered 2018-07-07: 125 mg via INTRAVENOUS

## 2018-07-07 MED ORDER — LIDOCAINE HCL (PF) 1 % IJ SOLN
INTRAMUSCULAR | Status: DC | PRN
  Administered 2018-07-07: 10 mL

## 2018-07-07 MED ORDER — HEPARIN SODIUM (PORCINE) 1000 UNIT/ML IJ SOLN
INTRAMUSCULAR | Status: AC
  Filled 2018-07-07: qty 1

## 2018-07-07 MED ORDER — DIPHENHYDRAMINE HCL 50 MG/ML IJ SOLN
INTRAMUSCULAR | Status: AC
  Administered 2018-07-07: 25 mg via INTRAVENOUS
  Filled 2018-07-07: qty 1

## 2018-07-07 SURGICAL SUPPLY — 21 items
BAG SNAP BAND KOVER 36X36 (MISCELLANEOUS) ×3 IMPLANT
BALLN MUSTANG 10.0X40 75 (BALLOONS) ×3
BALLN MUSTANG 4X20X135 (BALLOONS) ×3
BALLN MUSTANG 8.0X40 75 (BALLOONS) ×3
BALLOON MUSTANG 10.0X40 75 (BALLOONS) ×2 IMPLANT
BALLOON MUSTANG 4X20X135 (BALLOONS) ×2 IMPLANT
BALLOON MUSTANG 8.0X40 75 (BALLOONS) ×2 IMPLANT
COVER DOME SNAP 22 D (MISCELLANEOUS) ×3 IMPLANT
DEVICE TORQUE .025-.038 (MISCELLANEOUS) ×3 IMPLANT
GUIDEWIRE ANGLED .035X260CM (WIRE) ×3 IMPLANT
KIT ENCORE 26 ADVANTAGE (KITS) ×3 IMPLANT
KIT MICROPUNCTURE NIT STIFF (SHEATH) ×3 IMPLANT
PROTECTION STATION PRESSURIZED (MISCELLANEOUS) ×3
SHEATH PINNACLE R/O II 5F 6CM (SHEATH) ×3 IMPLANT
SHEATH PINNACLE R/O II 7F 4CM (SHEATH) ×3 IMPLANT
SHEATH PROBE COVER 6X72 (BAG) ×3 IMPLANT
STATION PROTECTION PRESSURIZED (MISCELLANEOUS) ×2 IMPLANT
STOPCOCK MORSE 400PSI 3WAY (MISCELLANEOUS) ×3 IMPLANT
TRAY PV CATH (CUSTOM PROCEDURE TRAY) ×3 IMPLANT
TUBING CIL FLEX 10 FLL-RA (TUBING) ×3 IMPLANT
WIRE BENTSON .035X145CM (WIRE) ×3 IMPLANT

## 2018-07-07 NOTE — Discharge Instructions (Signed)

## 2018-07-07 NOTE — H&P (Signed)
History and Physical Interval Note:  07/07/2018 11:05 AM  [name]  has presented today for surgery, with the diagnosis of poor flow  The various methods of treatment have been discussed with the patient and family. After consideration of risks, benefits and other options for treatment, the patient has consented to  Procedure(s): A/V FISTULAGRAM (Right) as a surgical intervention .  [name] history has been reviewed, patient examined, no change in status, stable for surgery.  I have reviewed the patient's chart and labs.  Questions were answered to the patient's satisfaction.    Right arm fistulogram.  Marty Heck  REASON FOR VISIT:   Follow-up of right basilic vein transposition  HPI:   SHANDIE BERTZ is a pleasant 63 y.o. female who had a second stage basilic vein transposition performed by Dr. Fortunato Curling on 02/01/2018.  She was seen by the nurse practitioner and apparently they were having problems cannulating the fistula.  I recommended that the patient get a duplex scan and the patient returns today to get her duplex scan and for an office evaluation.  She tells me that she is having some problems with bleeding when they remove the needles after dialysis.  She also states that the fistula has been "clotting" some.  She denies pain or paresthesias in her right arm.  She dialyzes on Tuesdays Thursdays and Saturdays.        Current Outpatient Medications  Medication Sig Dispense Refill  . amLODipine (NORVASC) 10 MG tablet Take 1 tablet (10 mg total) by mouth at bedtime. (Patient taking differently: Take 5 mg by mouth at bedtime. ) 30 tablet 0  . aspirin EC 81 MG tablet Take 81 mg by mouth daily.    [name] atorvastatin (LIPITOR) 10 MG tablet Take 1 tablet (10 mg total) by mouth at bedtime. 90 tablet 3  . Blood Glucose Monitoring Suppl (ACCU-CHEK AVIVA PLUS) W/DEVICE KIT Check sugars TID for E11.65 1 kit 0  . calcitRIOL (ROCALTROL) 0.25 MCG capsule Take 1 capsule (0.25  mcg total) by mouth every Monday, Wednesday, and Friday. (Patient taking differently: Take 0.25 mcg by mouth daily. ) 90 capsule 0  . cloNIDine (CATAPRES) 0.2 MG tablet Take 0.2 mg by mouth 2 (two) times daily.    [name] gabapentin (NEURONTIN) 300 MG capsule TAKE 1 CAPSULE BY MOUTH THREE TIMES DAILY 90 capsule 0  . glucose blood (ACCU-CHEK AVIVA) test strip Check sugars TID for E11.65 100 each 12  . hydrALAZINE (APRESOLINE) 25 MG tablet Take 4 tablets (100 mg total) by mouth 2 (two) times daily.    . Insulin Glargine (LANTUS SOLOSTAR) 100 UNIT/ML Solostar Pen Inject 12 Units into the skin daily at 10 pm. (Patient taking differently: Inject 10 Units into the skin daily. ) 5 pen PRN  . Lancet Devices (ACCU-CHEK SOFTCLIX) lancets Check sugars TID for E11.65 1 each 10  . methocarbamol (ROBAXIN) 500 MG tablet TAKE 1 TABLET BY MOUTH EVERY 6 HOURS AS NEEDED FOR MUSCLE SPASM 30 tablet 0  . metolazone (ZAROXOLYN) 2.5 MG tablet Take 2.5 mg by mouth daily.  5  . metoprolol succinate (TOPROL-XL) 50 MG 24 hr tablet Take 1 tablet (50 mg total) by mouth 2 (two) times daily. 60 tablet 0  . Multiple Vitamin (MULTIVITAMIN WITH MINERALS) TABS tablet Take 1 tablet by mouth daily.    [name] omeprazole (PRILOSEC) 20 MG capsule Take 20 mg by mouth daily before breakfast.     . polyethylene glycol (MIRALAX / GLYCOLAX) packet Take  17 g by mouth 2 (two) times daily. (Patient taking differently: Take 17 g by mouth daily as needed for moderate constipation. ) 14 each 0  . sertraline (ZOLOFT) 100 MG tablet Take 1 tablet (100 mg total) by mouth daily. 30 tablet 6  . calcium carbonate (TUMS EX) 750 MG chewable tablet Chew 1 tablet by mouth 3 (three) times daily as needed for heartburn.     . ferrous sulfate 325 (65 FE) MG tablet Take 1 tablet (325 mg total) by mouth 2 (two) times daily with a meal. (Patient not taking: Reported on 06/30/2018) 60 tablet 3  . sodium bicarbonate 650 MG tablet Take 1,300 mg by mouth 3 (three) times  daily.   3   No current facility-administered medications for this visit.     REVIEW OF SYSTEMS:  [X] denotes positive finding, [ ] denotes negative finding Vascular    Leg swelling    Cardiac    Chest pain or chest pressure:    Shortness of breath upon exertion:    Short of breath when lying flat:    Irregular heart rhythm:    Constitutional    Fever or chills:     PHYSICAL EXAM:      Vitals:   06/30/18 1301  BP: (!) 146/70  Pulse: 69  Resp: 14  Temp: 97.7 F (36.5 C)  TempSrc: Oral  SpO2: 98%  Weight: 130 lb (59 kg)  Height: 5' 7" (1.702 m)    GENERAL: The patient is a well-nourished female, in no acute distress. The vital signs are documented above. CARDIOVASCULAR: There is a regular rate and rhythm. PULMONARY: There is good air exchange bilaterally without wheezing or rales. VASCULAR: Her right upper arm fistula has an excellent thrill.  It is not pulsatile to suggest an outflow obstruction. She has a palpable right radial pulse.  DATA:   DUPLEX RIGHT UPPER ARM AV FISTULA: I have independently interpreted her duplex of her right basilic vein transposition.  The diameters of the fistula range from 0.61-0.95 cm.  There are 2 areas, 1 in the mid upper arm and one in the distal upper arm where there may be a retained valve.  There are elevated velocities at the proximal anastomosis and also some mildly elevated velocities in the proximal upper arm.  MEDICAL ISSUES:   STATUS POST RIGHT BASILIC VEIN TRANSPOSITION: The patient has a right basilic vein transposition which on exam has an excellent thrill.  However they have apparently been having problems with bleeding after dialysis and also some problems with "clotting" I would therefore recommend that we proceed with a fistulogram.  She dialyzes on Tuesdays Thursdays and Saturdays and she would like to have this done on a Wednesday so that her son can bring her.  We will schedule her for a  fistulogram and possible venoplasty on Wednesday.  I have discussed the indications for the procedure and the potential complications and she is agreeable to proceed.  Deitra Mayo [facility] [phone]

## 2018-07-07 NOTE — Op Note (Signed)
OPERATIVE NOTE   PROCEDURE: 1. Right brachiobasilic arteriovenous fistula cannulation under ultrasound guidance 2. Right arm fistulogram including central venogram 3. Right basilic vein venoplasty (8 mm x 40 mm Mustang) 4. Right innominate vein venoplasty (10 mm x 40 mm Mustang) 5. Right brachiobaslilic arteriovenous anastomosis angioplasty (4 mm x 20 mm Mustang)  PRE-OPERATIVE DIAGNOSIS: Malfunctioning right arteriovenous fistula  POST-OPERATIVE DIAGNOSIS: same as above   SURGEON: Marty Heck, MD  ANESTHESIA: local  ESTIMATED BLOOD LOSS: 5 cc  FINDING(S): 1. The brachiobasilic fistula was initially accessed in antegrade fashion toward central venous outflow.  We identified a mid basilic vein stenosis approximately 50% that was angioplastied with an 8 mm balloon with no residual stenosis.  We also angioplastied a second lesion in the right innominate vein given concern for possible web on the venogram.  We then removed our sheath and reaccessed the fistula in retrograde fashion toward the arterial anastomosis and then angioplastied the AV arterial anastomosis to the brachial artery with a 4 mm balloon given concern for stenosis.  There was a great thrill in the fistula at the completion of the case.  SPECIMEN(S):  None  CONTRAST: 55 cc  INDICATIONS: Brandy Houston is a 63 y.o. female who  presents with malfunctioning right brachiobasilic arteriovenous fistula.  The patient is scheduled for right arm fistulogram.  The patient is aware the risks include but are not limited to: bleeding, infection, thrombosis of the cannulated access, and possible anaphylactic reaction to the contrast.  The patient is aware of the risks of the procedure and elects to proceed forward.  DESCRIPTION: After full informed written consent was obtained, the patient was brought back to the angiography suite and placed supine upon the angiography table.  The patient was connected to monitoring  equipment.  The right arm was prepped and draped in the standard fashion for a right arm fistulogram.  Under ultrasound guidance, the right brachiobasilic arteriovenous fistula was evaluated, it was patent, an image was saved.  The fistula was cannulated with a micropuncture needle.  The microwire was advanced into the fistula and the needle was exchanged for the a microsheath, which was lodged 2 cm into the access.  The wire was removed and the sheath was connected to the IV extension tubing.  Hand injections were completed to image the access from the antecubitum up to the level of axilla.  The central venous structures were also imaged by hand injections.  We initially elected to treat approximate 50% stenosis in the mid basilic vein of the mid upper arm.  We then used a Bentson wire and exchanged for a short 7 [place] sheath.  The patient was given 3000 units of IV heparin.  We then used a 8 mm x 40 mm Mustang and treated the mid basilic vein lesion and had significant waist after angioplasty to nominal pressure  for 2 minutes.  We then exchanged for an 10 mm balloon and went up and treated a possible web around the right innominate vein after inflating to nominal pressure for 2 minutes.  Once these 2 venous outflow lesions were treated we then removed the 7 French sheath and placed several 4-0 Monocryl pursestring sutures at sheath access site.  We then reaccessed the fistula in retrograde fashion toward the arterial anastomosis given concern for a stenosis at the anastomosis.  We then used ultrasound guidance to again evaluate the fistula and it was patent and image was saved.  We then used a micro  access needle under ultrasound guidance and placed a micro-sheath we then used a soft angled Glidewire to get across the arterial anastomosis into the brachial artery.  I then placed a short 5 French sheath and confirmed with contrast injection that we were in the fistula.  We then selected a short 4 mm x 20 mm  angioplasty balloon and ballooned across the AV fistula anastomosis into the brachial artery.  I did not want to be too aggressive here given risk for rupture of the arterial anastomosis.  At that point in time our sheath and wires were then removed and we put another pursestring around the 5 French sheath with 4-0 monocryl and additional manual pressure was held for 5 minutes.  The patient was taken the PACU in stable condition.  COMPLICATIONS: None  CONDITION: Stable  Marty Heck, MD [facility] [place] Office: [phone] Pager: [phone]  07/07/2018 12:44 PM

## 2018-07-08 ENCOUNTER — Encounter (HOSPITAL_COMMUNITY): Payer: Self-pay | Admitting: Vascular Surgery

## 2018-07-08 DIAGNOSIS — N2581 Secondary hyperparathyroidism of renal origin: Secondary | ICD-10-CM | POA: Diagnosis not present

## 2018-07-08 DIAGNOSIS — D509 Iron deficiency anemia, unspecified: Secondary | ICD-10-CM | POA: Diagnosis not present

## 2018-07-08 DIAGNOSIS — D631 Anemia in chronic kidney disease: Secondary | ICD-10-CM | POA: Diagnosis not present

## 2018-07-08 DIAGNOSIS — N186 End stage renal disease: Secondary | ICD-10-CM | POA: Diagnosis not present

## 2018-07-08 DIAGNOSIS — Z23 Encounter for immunization: Secondary | ICD-10-CM | POA: Diagnosis not present

## 2018-07-08 DIAGNOSIS — E1129 Type 2 diabetes mellitus with other diabetic kidney complication: Secondary | ICD-10-CM | POA: Diagnosis not present

## 2018-07-09 ENCOUNTER — Ambulatory Visit: Payer: Medicare Other | Admitting: Physical Therapy

## 2018-07-09 ENCOUNTER — Encounter: Payer: Self-pay | Admitting: Physical Therapy

## 2018-07-09 DIAGNOSIS — R2681 Unsteadiness on feet: Secondary | ICD-10-CM

## 2018-07-09 DIAGNOSIS — R293 Abnormal posture: Secondary | ICD-10-CM

## 2018-07-09 DIAGNOSIS — M6249 Contracture of muscle, multiple sites: Secondary | ICD-10-CM | POA: Diagnosis not present

## 2018-07-09 DIAGNOSIS — M79605 Pain in left leg: Secondary | ICD-10-CM | POA: Diagnosis not present

## 2018-07-09 DIAGNOSIS — R2689 Other abnormalities of gait and mobility: Secondary | ICD-10-CM

## 2018-07-09 DIAGNOSIS — M6281 Muscle weakness (generalized): Secondary | ICD-10-CM | POA: Diagnosis not present

## 2018-07-09 NOTE — Patient Instructions (Signed)
Do each exercise 2  times per day Do each exercise 10 repetitions Hold each exercise for 3-5 seconds to feel your location  AT SINK FIND YOUR MIDLINE POSITION AND PLACE FEET EQUAL DISTANCE FROM THE MIDLINE.  USE TAPE ON FLOOR TO MARK THE MIDLINE POSITION. You also should try to feel with your limb pressure in socket.  You are trying to feel with limb what you used to feel with the bottom of your foot.  1. Side to Side Shift: Moving your hips only (not shoulders): move weight onto your left leg, HOLD/FEEL.  Move back to equal weight on each leg, HOLD/FEEL. Move weight onto your right leg, HOLD/FEEL. Move back to equal weight on each leg, HOLD/FEEL. Repeat. 2. Front to Back Shift: Moving your hips only (not shoulders): move your weight forward onto your toes, HOLD/FEEL. Move your weight back to equal Flat Foot on both legs, HOLD/FEEL. Move your weight back onto your heels, HOLD/FEEL. Move your weight back to equal on both legs, HOLD/FEEL. Repeat. 3. Moving Cones / Cups: With equal weight on each leg: Hold on with one hand the first time, then progress to no hand supports. Move cups from one side of sink to the other. Place cups ~2" out of your reach, progress to 10" beyond reach. 4. Overhead/Upward Reaching: alternated reaching up to top cabinets or ceiling if no cabinets present. Keep equal weight on each leg. Start with one hand support on counter while other hand reaches and progress to no hand support with reaching. 5.   Looking Over Shoulders: With equal weight on each leg: alternate turning to look    over your shoulders with one hand support on counter as needed. Shift weight to side looking, pull hip then shoulder then head/eyes around to look behind you. Start with one hand support & progress to no hand support. 

## 2018-07-10 DIAGNOSIS — N186 End stage renal disease: Secondary | ICD-10-CM | POA: Diagnosis not present

## 2018-07-10 DIAGNOSIS — N2581 Secondary hyperparathyroidism of renal origin: Secondary | ICD-10-CM | POA: Diagnosis not present

## 2018-07-10 DIAGNOSIS — E1129 Type 2 diabetes mellitus with other diabetic kidney complication: Secondary | ICD-10-CM | POA: Diagnosis not present

## 2018-07-10 DIAGNOSIS — D509 Iron deficiency anemia, unspecified: Secondary | ICD-10-CM | POA: Diagnosis not present

## 2018-07-10 DIAGNOSIS — Z23 Encounter for immunization: Secondary | ICD-10-CM | POA: Diagnosis not present

## 2018-07-10 DIAGNOSIS — D631 Anemia in chronic kidney disease: Secondary | ICD-10-CM | POA: Diagnosis not present

## 2018-07-11 NOTE — Therapy (Signed)
[facility] [address] [place], [place], [address] Phone: [phone]   Fax:  [phone]  Physical Therapy Treatment  Patient Details  Name: Brandy Houston MRN: [id number] Date of Birth: Dec 15, 1955 Referring Provider (PT): Meridee Score, MD   Encounter Date: 07/09/2018     07/09/18 0938  PT Visits / Re-Eval  Visit Number 2  Number of Visits 24  Date for PT Re-Evaluation 09/26/18  Authorization  Authorization Type Medicare & Medicaid  PT Time Calculation  PT Start Time 0934  PT Stop Time 1014  PT Time Calculation (min) 40 min  PT - End of Session  Equipment Utilized During Treatment Gait belt  Activity Tolerance Patient tolerated treatment well  Behavior During Therapy [facility] for tasks assessed/performed    Past Medical History:  Diagnosis Date  . Anemia   . Anxiety   . Arthritis    "in my joints" (03/10/2017)  . Chronic diastolic CHF (congestive heart failure) ([place])   . CKD (chronic kidney disease), stage IV ([place])    stage IV. previous HD, none currently 10/30/15 (confirmed 02/05/2017 & 03/10/2017)  . Depression    Chronic  . Diabetic peripheral neuropathy ([place])   . Dysrhythmia    tachycardia, normal ECHO 08-09-14  . GERD (gastroesophageal reflux disease)   . Heart murmur     dx'd 02/05/2017  . Hepatitis C    "tx'd in 2016; I'm negative now" (02/05/2017)  . History of blood transfusion 2017   "w/knee replacement"  . Hypertension   . Osteoarthritis of left knee   . Peripheral neuropathy   . Protein calorie malnutrition ([place])   . Septic arthritis ([place])   . Slow transit constipation   . Type II diabetes mellitus ([name])    IDDM  . Uncontrolled hypertension 02/18/2013  . Unsteady gait     Past Surgical History:  Procedure Laterality Date  . A/V FISTULAGRAM Right 07/07/2018   Procedure: A/V FISTULAGRAM;  Surgeon: Marty Heck, MD;  Location: [place];  Service: Cardiovascular;  Laterality:  Right;  . AMPUTATION Left 03/20/2017   Procedure: LEFT ABOVE KNEE AMPUTATION;  Surgeon: Newt Minion, MD;  Location: [facility];  Service: Orthopedics;  Laterality: Left;  . APPENDECTOMY    . AV FISTULA PLACEMENT Left 09/13/2014   Procedure: Brachial Artery to Brachial Vein Gortex Four - Seven Stretch GRAFT INSERTION Left Forearm;  Surgeon: Mal Misty, MD;  Location: [facility];  Service: Vascular;  Laterality: Left;  . BASCILIC VEIN TRANSPOSITION Right 10/26/2017   Procedure: BASILIC VEIN TRANSPOSITION FIRST STAGE RIGHT ARM;  Surgeon: Conrad [place], MD;  Location: [place];  Service: Vascular;  Laterality: Right;  . BASCILIC VEIN TRANSPOSITION Right 02/01/2018   Procedure: SECOND STAGE BASILIC VEIN TRANSPOSITION RIGHT UPPER EXTREMITY;  Surgeon: Marty Heck, MD;  Location: [place];  Service: Vascular;  Laterality: Right;  . CHOLECYSTECTOMY OPEN    . COLON SURGERY    . EXCISIONAL TOTAL KNEE ARTHROPLASTY WITH ANTIBIOTIC SPACERS Left 02/06/2017   Procedure: Incisional total iknee with antibiotic spacer ;  Surgeon: Leandrew Koyanagi, MD;  Location: [facility];  Service: Orthopedics;  Laterality: Left;  . IR FLUORO GUIDE CV LINE RIGHT  02/10/2017  . IR REMOVAL TUN CV CATH W/O FL  04/01/2017  . IR US GUIDE VASC ACCESS RIGHT  02/10/2017  . IRRIGATION AND DEBRIDEMENT KNEE Left 03/12/2017   Procedure: IRRIGATION AND DEBRIDEMENT LEFT KNEE WITH WOUND VAC APPLICATION;  Surgeon: Leandrew Koyanagi,  MD;  Location: [place];  Service: Orthopedics;  Laterality: Left;  . JOINT REPLACEMENT    . KNEE ARTHROSCOPY Right 08/10/2014   Procedure: ARTHROSCOPY I & D KNEE;  Surgeon: Marianna Payment, MD;  Location: [facility];  Service: Orthopedics;  Laterality: Right;  . KNEE ARTHROSCOPY Left 08/11/2014   Procedure: ARTHROSCOPIC WASHOUT LEFT KNEE;  Surgeon: Marianna Payment, MD;  Location: [facility];  Service: Orthopedics;  Laterality: Left;  . KNEE ARTHROSCOPY Left 08/19/2014   Procedure: ARTHROSCOPIC WASHOUT LEFT KNEE;  Surgeon: Leandrew Koyanagi, MD;   Location: [place];  Service: Orthopedics;  Laterality: Left;  . KNEE ARTHROSCOPY WITH LATERAL MENISECTOMY Left 04/04/2015   Procedure: AND PARTIAL LATERAL MENISECTOMY;  Surgeon: Leandrew Koyanagi, MD;  Location: [facility];  Service: Orthopedics;  Laterality: Left;  . KNEE ARTHROSCOPY WITH MEDIAL MENISECTOMY Left 04/04/2015   Procedure: LEFT KNEE ARTHROSCOPY WITH PARTIAL MEDIAL MENISCECTOMY  AND SYNOVECTOMY;  Surgeon: Leandrew Koyanagi, MD;  Location: [place];  Service: Orthopedics;  Laterality: Left;  . PERIPHERAL VASCULAR BALLOON ANGIOPLASTY  07/07/2018   Procedure: PERIPHERAL VASCULAR BALLOON ANGIOPLASTY;  Surgeon: Marty Heck, MD;  Location: [facility];  Service: Cardiovascular;;  right AV fistula  . SHOULDER ARTHROSCOPY Bilateral 08/10/2014   Procedure: I & D BILATERAL SHOULDERS ;  Surgeon: Marianna Payment, MD;  Location: [facility];  Service: Orthopedics;  Laterality: Bilateral;  . SMALL INTESTINE SURGERY     Due to Small Bowel Obstruction; "fixed it when they did my gallbladder OR"  . TEE WITHOUT CARDIOVERSION N/A 08/14/2014   Procedure: TRANSESOPHAGEAL ECHOCARDIOGRAM (TEE);  Surgeon: Thayer Headings, MD;  Location: [facility];  Service: Cardiovascular;  Laterality: N/A;  . TENOSYNOVECTOMY Right 08/11/2014   Procedure: RIGHT WRIST IRRIGATION AND DEBRIDEMENT, TENOSYNOVECTOMY;  Surgeon: Marianna Payment, MD;  Location: [facility];  Service: Orthopedics;  Laterality: Right;  . TOTAL KNEE ARTHROPLASTY Left 11/07/2015   Procedure: LEFT TOTAL KNEE ARTHROPLASTY WITH REVISION OF IMPLANTS;  Surgeon: Leandrew Koyanagi, MD;  Location: [facility];  Service: Orthopedics;  Laterality: Left;  . TUBAL LIGATION      There were no vitals filed for this visit.     07/09/18 0937  Symptoms/Limitations  Subjective No new complatins. No falls or pain to report.   Pertinent History left TFA, DM, neuropathy, Hep C, HTN, CHF, depression, CKD st 5 not dialysis, arthritis, heart murmur,    Limitations Standing;Walking;House hold activities  Patient Stated Goals To walk with prosthesis in home & community, travel plane, car & cruise in September  Pain Assessment  Currently in Pain? No/denies  Pain Score 0      07/09/18 0939  Transfers  Transfers Sit to Stand;Stand to Sit  Sit to Stand 5: Supervision;With upper extremity assist;From chair/3-in-1  Sit to Stand Details Verbal cues for sequencing;Verbal cues for technique;Verbal cues for safe use of DME/AE  Stand to Sit 5: Supervision;With upper extremity assist;To chair/3-in-1  Stand to Sit Details (indicate cue type and reason) Verbal cues for sequencing;Verbal cues for technique;Verbal cues for safe use of DME/AE  Comments x2 in room with donning extra sock, x 2 at sink.   Neuro Re-ed   Neuro Re-ed Details  for balance/proprioception: educated pt on sink HEP with no issues noted or reported with performance in session today.   Prosthetics  Prosthetic Care Comments  prosthesis not engaged/fully suspended on initial stand to sink. min assist needed to remove air to fully suspend prosthesis.  pt with digging into groin reported. added a second 5 ply sock for total of 10 ply sock with pt reporting not further groin pain afterwards with standing.   Current prosthetic wear tolerance (days/week)  work it on the 21st only. had surgery on her arm after this and did not wear it.   Current prosthetic wear tolerance (#hours/day)  for about 4-5 hours total on the day she wore it to dialysis  Residual limb condition  intact per pt report  Donning Prosthesis 5;4  Doffing Prosthesis 6       07/09/18 1012  PT Education  Education provided Yes  Education Details sink HEP for balance and proprioception  Person(s) Educated Patient  Methods Explanation;Demonstration;Tactile cues;Handout;Verbal cues  Comprehension Verbalized understanding;Returned demonstration;Verbal cues required;Tactile cues required;Need further instruction      PT  Short Term Goals - 06/28/18 1200      PT SHORT TERM GOAL #1   Title  Patient demonstrates proper donning of prosthesis with new suction ring suspension with supervision. (All STGs Target Date: 07/30/2018)    Time  1    Period  Months    Status  New    Target Date  07/30/18      PT SHORT TERM GOAL #2   Title  patient reports prosthesis wear daily >/= 8 hrs/day on nondialysis days and >/= 3hrs/day on dialysis days    Time  1    Period  Months    Status  New    Target Date  07/30/18      PT SHORT TERM GOAL #3   Title  Patient able to reach 10" anteriorly and to floor with RW support with prosthesis safely.     Time  1    Period  Months    Status  New    Target Date  07/30/18      PT SHORT TERM GOAL #4   Title  Patient ambulates 200' with RW & prosthesis with supervision.     Time  1    Period  Months    Status  New    Target Date  07/30/18        PT Long Term Goals - 06/28/18 1200      PT LONG TERM GOAL #1   Title  Patient verbalizes & demonstrates proper prosthetic care with new prosthetic design including prosthesis use at dialysis to enable safe use of prosthesis. (All LTGs Target Date: 09/26/2018)    Time  3    Period  Months    Status  New    Target Date  09/26/18      PT LONG TERM GOAL #2   Title  Patient tolerates & reports prosthesis wear daily >75% of awake hours without skin issues or limb pain.     Time  3    Period  Months    Status  New    Target Date  09/26/18      PT LONG TERM GOAL #3   Title  Patient ambulates 500' with one seated rest <3 minutes outdoors with rollator walker & prosthesis modified independent for community mobility.     Time  3    Period  Months    Status  New    Target Date  09/26/18      PT LONG TERM GOAL #4   Title  Patient negotiates ramps, curbs with rollator walker & stairs 1 rail/cane & prosthesis modified independent to enable community mobility.  Time  3    Period  Months    Status  New    Target Date  09/26/18       PT LONG TERM GOAL #5   Title  Improve standing balance with tasks of Berg Balance Test with walker support >50/56    Time  3    Period  Months    Status  New    Target Date  09/26/18          07/09/18 3845  Plan  Clinical Impression Statement Today's skilled session focused on proper donning of prosthesis with suction suspension, sock ply management and issuing of sink HEP. The pt is progressing toward goals and should benefit from continued PT to progress toward unmet goals.   Pt will benefit from skilled therapeutic intervention in order to improve on the following deficits Abnormal gait;Decreased activity tolerance;Decreased balance;Decreased endurance;Decreased knowledge of use of DME;Decreased mobility;Decreased strength;Decreased range of motion;Impaired flexibility;Prosthetic Dependency;Postural dysfunction;Increased edema;Dizziness  Rehab Potential Good  PT Frequency 2x / week  PT Duration 12 weeks  PT Treatment/Interventions ADLs/Self Care Home Management;Moist Heat;DME Instruction;Gait training;Stair training;Functional mobility training;Therapeutic activities;Therapeutic exercise;Balance training;Neuromuscular re-education;Patient/family education;Prosthetic Training;Manual techniques;Vestibular  PT Next Visit Plan continue with prosthesis education, obtain prosthetic weight for pt for wearing at dialysis, gait with RW         Patient will benefit from skilled therapeutic intervention in order to improve the following deficits and impairments:  Abnormal gait, Decreased activity tolerance, Decreased balance, Decreased endurance, Decreased knowledge of use of DME, Decreased mobility, Decreased strength, Decreased range of motion, Impaired flexibility, Prosthetic Dependency, Postural dysfunction, Increased edema, Dizziness  Visit Diagnosis: Other abnormalities of gait and mobility  Muscle weakness (generalized)  Abnormal posture  Unsteadiness on feet     Problem  List Patient Active Problem List   Diagnosis Date Noted  . Benign essential HTN   . Stage 5 chronic kidney disease not on chronic dialysis ([place])   . Above knee amputation status, left   . DM type 2 causing CKD stage 5 ([place]) 03/21/2017  . Anemia of chronic disease 02/05/2017  . Dysplasia of cervix, high grade CIN 2 01/12/2017  . Chronic diastolic CHF (congestive heart failure) ([place]) 11/08/2015  . Total knee replacement status   . Diabetic peripheral neuropathy ([place])   . Major depression, recurrent, chronic ([place])   . Chronic kidney disease (CKD), stage IV (severe) ([place])   . Hepatitis C 08/16/2014  . Peripheral neuropathy 06/24/2013  . Compulsive tobacco user syndrome 06/24/2013    Willow Ora, PTA, [place] [address], [place], [place] [address] [phone] 07/11/18, 5:36 PM   Name: Brandy Houston MRN: [id number] Date of Birth: 1955/08/28

## 2018-07-12 ENCOUNTER — Ambulatory Visit: Payer: Medicare Other | Admitting: Physical Therapy

## 2018-07-12 DIAGNOSIS — M79605 Pain in left leg: Secondary | ICD-10-CM | POA: Diagnosis not present

## 2018-07-12 DIAGNOSIS — R2681 Unsteadiness on feet: Secondary | ICD-10-CM

## 2018-07-12 DIAGNOSIS — M6249 Contracture of muscle, multiple sites: Secondary | ICD-10-CM | POA: Diagnosis not present

## 2018-07-12 DIAGNOSIS — R2689 Other abnormalities of gait and mobility: Secondary | ICD-10-CM

## 2018-07-12 DIAGNOSIS — M6281 Muscle weakness (generalized): Secondary | ICD-10-CM | POA: Diagnosis not present

## 2018-07-12 DIAGNOSIS — R293 Abnormal posture: Secondary | ICD-10-CM

## 2018-07-12 DIAGNOSIS — R262 Difficulty in walking, not elsewhere classified: Secondary | ICD-10-CM

## 2018-07-12 NOTE — Therapy (Signed)
[facility] [address] [place], [place], [address] Phone: [phone]   Fax:  [phone]  Physical Therapy Treatment  Patient Details  Name: Brandy Houston MRN: [id number] Date of Birth: 08-May-1956 Referring Provider (PT): Meridee Score, MD   Encounter Date: 07/12/2018  PT End of Session - 07/12/18 1102    Visit Number  3    Number of Visits  24    Date for PT Re-Evaluation  09/26/18    Authorization Type  Medicare & Medicaid    PT Start Time  1022    PT Stop Time  1100    PT Time Calculation (min)  38 min    Equipment Utilized During Treatment  Gait belt    Activity Tolerance  Patient tolerated treatment well    Behavior During Therapy  WFL for tasks assessed/performed       Past Medical History:  Diagnosis Date  . Anemia   . Anxiety   . Arthritis    "in my joints" (03/10/2017)  . Chronic diastolic CHF (congestive heart failure) ([place])   . CKD (chronic kidney disease), stage IV ([place])    stage IV. previous HD, none currently 10/30/15 (confirmed 02/05/2017 & 03/10/2017)  . Depression    Chronic  . Diabetic peripheral neuropathy ([place])   . Dysrhythmia    tachycardia, normal ECHO 08-09-14  . GERD (gastroesophageal reflux disease)   . Heart murmur     dx'd 02/05/2017  . Hepatitis C    "tx'd in 2016; I'm negative now" (02/05/2017)  . History of blood transfusion 2017   "w/knee replacement"  . Hypertension   . Osteoarthritis of left knee   . Peripheral neuropathy   . Protein calorie malnutrition ([place])   . Septic arthritis ([place])   . Slow transit constipation   . Type II diabetes mellitus ([name])    IDDM  . Uncontrolled hypertension 02/18/2013  . Unsteady gait     Past Surgical History:  Procedure Laterality Date  . A/V FISTULAGRAM Right 07/07/2018   Procedure: A/V FISTULAGRAM;  Surgeon: Marty Heck, MD;  Location: [facility];  Service: Cardiovascular;  Laterality: Right;  . AMPUTATION Left  03/20/2017   Procedure: LEFT ABOVE KNEE AMPUTATION;  Surgeon: Newt Minion, MD;  Location: [facility];  Service: Orthopedics;  Laterality: Left;  . APPENDECTOMY    . AV FISTULA PLACEMENT Left 09/13/2014   Procedure: Brachial Artery to Brachial Vein Gortex Four - Seven Stretch GRAFT INSERTION Left Forearm;  Surgeon: Mal Misty, MD;  Location: [facility];  Service: Vascular;  Laterality: Left;  . BASCILIC VEIN TRANSPOSITION Right 10/26/2017   Procedure: BASILIC VEIN TRANSPOSITION FIRST STAGE RIGHT ARM;  Surgeon: Conrad Bryant, MD;  Location: [facility];  Service: Vascular;  Laterality: Right;  . BASCILIC VEIN TRANSPOSITION Right 02/01/2018   Procedure: SECOND STAGE BASILIC VEIN TRANSPOSITION RIGHT UPPER EXTREMITY;  Surgeon: Marty Heck, MD;  Location: [facility];  Service: Vascular;  Laterality: Right;  . CHOLECYSTECTOMY OPEN    . COLON SURGERY    . EXCISIONAL TOTAL KNEE ARTHROPLASTY WITH ANTIBIOTIC SPACERS Left 02/06/2017   Procedure: Incisional total iknee with antibiotic spacer ;  Surgeon: Leandrew Koyanagi, MD;  Location: [facility];  Service: Orthopedics;  Laterality: Left;  . IR FLUORO GUIDE CV LINE RIGHT  02/10/2017  . IR REMOVAL TUN CV CATH W/O FL  04/01/2017  . IR US GUIDE VASC ACCESS RIGHT  02/10/2017  . IRRIGATION AND DEBRIDEMENT KNEE Left  03/12/2017   Procedure: IRRIGATION AND DEBRIDEMENT LEFT KNEE WITH WOUND VAC APPLICATION;  Surgeon: Leandrew Koyanagi, MD;  Location: [facility];  Service: Orthopedics;  Laterality: Left;  . JOINT REPLACEMENT    . KNEE ARTHROSCOPY Right 08/10/2014   Procedure: ARTHROSCOPY I & D KNEE;  Surgeon: Marianna Payment, MD;  [facility];  Service: Orthopedics;  Laterality: Right;  . KNEE ARTHROSCOPY Left 08/11/2014   Procedure: ARTHROSCOPIC WASHOUT LEFT KNEE;  Surgeon: Marianna Payment, MD;  Location: [facility];  Service: Orthopedics;  Laterality: Left;  . KNEE ARTHROSCOPY Left 08/19/2014   Procedure: ARTHROSCOPIC WASHOUT LEFT KNEE;  Surgeon: Leandrew Koyanagi, MD;  Location: [facility];  Service:  Orthopedics;  Laterality: Left;  . KNEE ARTHROSCOPY WITH LATERAL MENISECTOMY Left 04/04/2015   Procedure: AND PARTIAL LATERAL MENISECTOMY;  Surgeon: Leandrew Koyanagi, MD;  Location: [facility];  Service: Orthopedics;  Laterality: Left;  . KNEE ARTHROSCOPY WITH MEDIAL MENISECTOMY Left 04/04/2015   Procedure: LEFT KNEE ARTHROSCOPY WITH PARTIAL MEDIAL MENISCECTOMY  AND SYNOVECTOMY;  Surgeon: Leandrew Koyanagi, MD;  Location: [facility];  Service: Orthopedics;  Laterality: Left;  . PERIPHERAL VASCULAR BALLOON ANGIOPLASTY  07/07/2018   Procedure: PERIPHERAL VASCULAR BALLOON ANGIOPLASTY;  Surgeon: Marty Heck, MD;  Location: [facility];  Service: Cardiovascular;;  right AV fistula  . SHOULDER ARTHROSCOPY Bilateral 08/10/2014   Procedure: I & D BILATERAL SHOULDERS ;  Surgeon: Marianna Payment, MD;  [facility];  Service: Orthopedics;  Laterality: Bilateral;  . SMALL INTESTINE SURGERY     Due to Small Bowel Obstruction; "fixed it when they did my gallbladder OR"  . TEE WITHOUT CARDIOVERSION N/A 08/14/2014   Procedure: TRANSESOPHAGEAL ECHOCARDIOGRAM (TEE);  Surgeon: Thayer Headings, MD;  Location: [facility];  Service: Cardiovascular;  Laterality: N/A;  . TENOSYNOVECTOMY Right 08/11/2014   Procedure: RIGHT WRIST IRRIGATION AND DEBRIDEMENT, TENOSYNOVECTOMY;  Surgeon: Marianna Payment, MD;  Location: [facility];  Service: Orthopedics;  Laterality: Right;  . TOTAL KNEE ARTHROPLASTY Left 11/07/2015   Procedure: LEFT TOTAL KNEE ARTHROPLASTY WITH REVISION OF IMPLANTS;  Surgeon: Leandrew Koyanagi, MD;  Location: [facility];  Service: Orthopedics;  Laterality: Left;  . TUBAL LIGATION      There were no vitals filed for this visit.  Subjective Assessment - 07/12/18 1024    Subjective  Pt was last seen on Friday 1/24 and she wore it one day since on Sunday 07/11/18, currently not wearing on dialysis days.    Pertinent History  left TFA, DM, neuropathy, Hep C, HTN, CHF, depression,  CKD st 5 not dialysis, arthritis, heart murmur,     Limitations  Standing;Walking;House hold activities    Patient Stated Goals  To walk with prosthesis in home & community, travel plane, car & cruise in September    Currently in Pain?  No/denies                       [facility] Adult PT Treatment/Exercise - 07/12/18 0001      Ambulation/Gait   Ambulation/Gait  Yes    Ambulation/Gait Assistance  5: Supervision    Ambulation/Gait Assistance Details  Working on L foot placement and decreasing UE support , proper weight shifting onto prosthesis.   Ambulation Distance (Feet)  200 Feet   +100   Assistive device  Rolling walker;Prosthesis    Gait Pattern  Step-through pattern;Decreased step length - right;Decreased stance time - left;Decreased hip/knee flexion - left;Decreased  weight shift to left;Left circumduction;Left hip hike;Antalgic;Lateral hip instability;Trunk flexed    Ambulation Surface  Level;Indoor    Ramp  Other (comment)   min guard due to slight LOB posteriorly when ascending.   Ramp Details (indicate cue type and reason)  cues for weight shifting and RW placement    Curb  5: Supervision    Curb Details (indicate cue type and reason)  with RW      Prosthetics   Prosthetic Care Comments   Recommend 4 hrs on non dialysis. Pt wore 6 ply  during this session.    Current prosthetic wear tolerance (days/week)   Pt wore one day since last visit for 2 hrs    Current prosthetic wear tolerance (#hours/day)   2 hrs     Residual limb condition   intact per pt report    Person(s) Educated  Patient    Education Method  Explanation;Demonstration    Education Method  Verbalized understanding               PT Short Term Goals - 06/28/18 1200      PT SHORT TERM GOAL #1   Title  Patient demonstrates proper donning of prosthesis with new suction ring suspension with supervision. (All STGs Target Date: 07/30/2018)    Time  1    Period  Months    Status  New    Target  Date  07/30/18      PT SHORT TERM GOAL #2   Title  patient reports prosthesis wear daily >/= 8 hrs/day on nondialysis days and >/= 3hrs/day on dialysis days    Time  1    Period  Months    Status  New    Target Date  07/30/18      PT SHORT TERM GOAL #3   Title  Patient able to reach 10" anteriorly and to floor with RW support with prosthesis safely.     Time  1    Period  Months    Status  New    Target Date  07/30/18      PT SHORT TERM GOAL #4   Title  Patient ambulates 200' with RW & prosthesis with supervision.     Time  1    Period  Months    Status  New    Target Date  07/30/18        PT Long Term Goals - 06/28/18 1200      PT LONG TERM GOAL #1   Title  Patient verbalizes & demonstrates proper prosthetic care with new prosthetic design including prosthesis use at dialysis to enable safe use of prosthesis. (All LTGs Target Date: 09/26/2018)    Time  3    Period  Months    Status  New    Target Date  09/26/18      PT LONG TERM GOAL #2   Title  Patient tolerates & reports prosthesis wear daily >75% of awake hours without skin issues or limb pain.     Time  3    Period  Months    Status  New    Target Date  09/26/18      PT LONG TERM GOAL #3   Title  Patient ambulates 500' with one seated rest <3 minutes outdoors with rollator walker & prosthesis modified independent for community mobility.     Time  3    Period  Months    Status  New    Target Date  09/26/18      PT LONG TERM GOAL #4   Title  Patient negotiates ramps, curbs with rollator walker & stairs 1 rail/cane & prosthesis modified independent to enable community mobility.     Time  3    Period  Months    Status  New    Target Date  09/26/18      PT LONG TERM GOAL #5   Title  Improve standing balance with tasks of Berg Balance Test with walker support >50/56    Time  3    Period  Months    Status  New    Target Date  09/26/18            Plan - 07/12/18 1319    Clinical Impression Statement   Skilled session focused on prosthetic managment and gait training with RW including community barriers.  Pt able to progress distance gait working on proper step length, weight shifting and decreasing UE support and negotiate ramp and curb at min guard to supervision level.                                                                                                 Rehab Potential  Good    PT Frequency  2x / week    PT Duration  12 weeks    PT Treatment/Interventions  ADLs/Self Care Home Management;Moist Heat;DME Instruction;Gait training;Stair training;Functional mobility training;Therapeutic activities;Therapeutic exercise;Balance training;Neuromuscular re-education;Patient/family education;Prosthetic Training;Manual techniques;Vestibular    PT Next Visit Plan  continue with prosthesis education, obtain prosthetic weight for pt for wearing at dialysis, gait with RW       Patient will benefit from skilled therapeutic intervention in order to improve the following deficits and impairments:  Abnormal gait, Decreased activity tolerance, Decreased balance, Decreased endurance, Decreased knowledge of use of DME, Decreased mobility, Decreased strength, Decreased range of motion, Impaired flexibility, Prosthetic Dependency, Postural dysfunction, Increased edema, Dizziness  Visit Diagnosis: Muscle weakness (generalized)  Abnormal posture  Unsteadiness on feet  Difficulty in walking, not elsewhere classified  Other abnormalities of gait and mobility     Problem List Patient Active Problem List   Diagnosis Date Noted  . Benign essential HTN   . Stage 5 chronic kidney disease not on chronic dialysis ([place])   . Above knee amputation status, left   . DM type 2 causing CKD stage 5 ([place]) 03/21/2017  . Anemia of chronic disease 02/05/2017  . Dysplasia of cervix, high grade CIN 2 01/12/2017  . Chronic diastolic CHF (congestive heart failure) ([place]) 11/08/2015  . Total knee replacement status   .  Diabetic peripheral neuropathy ([place])   . Major depression, recurrent, chronic ([place])   . Chronic kidney disease (CKD), stage IV (severe) ([place])   . Hepatitis C 08/16/2014  . Peripheral neuropathy 06/24/2013  . Compulsive tobacco user syndrome 06/24/2013    Bjorn Loser, PTA  07/12/18, 1:27 PM [facility] [address] [place], [place], [address] Phone: [phone]   Fax:  [phone]  Name: Brandy Houston MRN: [id number] Date of Birth: 02-03-1956

## 2018-07-13 DIAGNOSIS — N186 End stage renal disease: Secondary | ICD-10-CM | POA: Diagnosis not present

## 2018-07-13 DIAGNOSIS — N2581 Secondary hyperparathyroidism of renal origin: Secondary | ICD-10-CM | POA: Diagnosis not present

## 2018-07-13 DIAGNOSIS — Z23 Encounter for immunization: Secondary | ICD-10-CM | POA: Diagnosis not present

## 2018-07-13 DIAGNOSIS — E1129 Type 2 diabetes mellitus with other diabetic kidney complication: Secondary | ICD-10-CM | POA: Diagnosis not present

## 2018-07-13 DIAGNOSIS — D631 Anemia in chronic kidney disease: Secondary | ICD-10-CM | POA: Diagnosis not present

## 2018-07-13 DIAGNOSIS — D509 Iron deficiency anemia, unspecified: Secondary | ICD-10-CM | POA: Diagnosis not present

## 2018-07-14 ENCOUNTER — Encounter: Payer: Self-pay | Admitting: Physical Therapy

## 2018-07-14 ENCOUNTER — Ambulatory Visit: Payer: Medicare Other | Admitting: Physical Therapy

## 2018-07-14 DIAGNOSIS — R2681 Unsteadiness on feet: Secondary | ICD-10-CM | POA: Diagnosis not present

## 2018-07-14 DIAGNOSIS — R2689 Other abnormalities of gait and mobility: Secondary | ICD-10-CM | POA: Diagnosis not present

## 2018-07-14 DIAGNOSIS — M6281 Muscle weakness (generalized): Secondary | ICD-10-CM | POA: Diagnosis not present

## 2018-07-14 DIAGNOSIS — M6249 Contracture of muscle, multiple sites: Secondary | ICD-10-CM | POA: Diagnosis not present

## 2018-07-14 DIAGNOSIS — R293 Abnormal posture: Secondary | ICD-10-CM | POA: Diagnosis not present

## 2018-07-14 DIAGNOSIS — M79605 Pain in left leg: Secondary | ICD-10-CM | POA: Diagnosis not present

## 2018-07-14 DIAGNOSIS — R262 Difficulty in walking, not elsewhere classified: Secondary | ICD-10-CM

## 2018-07-14 NOTE — Therapy (Signed)
[facility] [address] [place], [place], [address] Phone: [phone]   Fax:  [phone]  Physical Therapy Treatment  Patient Details  Name: Brandy Houston MRN: [id number] Date of Birth: 22-Jun-1955 Referring Provider (PT): Meridee Score, MD   Encounter Date: 07/14/2018  PT End of Session - 07/14/18 1103    Visit Number  4    Number of Visits  24    Date for PT Re-Evaluation  09/26/18    Authorization Type  Medicare & Medicaid    PT Start Time  1020    PT Stop Time  1100    PT Time Calculation (min)  40 min    Equipment Utilized During Treatment  Gait belt    Activity Tolerance  Patient tolerated treatment well    Behavior During Therapy  WFL for tasks assessed/performed       Past Medical History:  Diagnosis Date  . Anemia   . Anxiety   . Arthritis    "in my joints" (03/10/2017)  . Chronic diastolic CHF (congestive heart failure) ([facility])   . CKD (chronic kidney disease), stage IV ([facility])    stage IV. previous HD, none currently 10/30/15 (confirmed 02/05/2017 & 03/10/2017)  . Depression    Chronic  . Diabetic peripheral neuropathy ([place] [facility])   . Dysrhythmia    tachycardia, normal ECHO 08-09-14  . GERD (gastroesophageal reflux disease)   . Heart murmur     dx'd 02/05/2017  . Hepatitis C    "tx'd in 2016; I'm negative now" (02/05/2017)  . History of blood transfusion 2017   "w/knee replacement"  . Hypertension   . Osteoarthritis of left knee   . Peripheral neuropathy   . Protein calorie malnutrition ([place])   . Septic arthritis ([facility])   . Slow transit constipation   . Type II diabetes mellitus ([name])    IDDM  . Uncontrolled hypertension 02/18/2013  . Unsteady gait     Past Surgical History:  Procedure Laterality Date  . A/V FISTULAGRAM Right 07/07/2018   Procedure: A/V FISTULAGRAM;  Surgeon: Marty Heck, MD;  Location: [facility];  Service: Cardiovascular;  Laterality: Right;  . AMPUTATION Left  03/20/2017   Procedure: LEFT ABOVE KNEE AMPUTATION;  Surgeon: Newt Minion, MD;  Location: [facility];  Service: Orthopedics;  Laterality: Left;  . APPENDECTOMY    . AV FISTULA PLACEMENT Left 09/13/2014   Procedure: Brachial Artery to Brachial Vein Gortex Four - Seven Stretch GRAFT INSERTION Left Forearm;  Surgeon: Mal Misty, MD;  Location: [facility];  Service: Vascular;  Laterality: Left;  . BASCILIC VEIN TRANSPOSITION Right 10/26/2017   Procedure: BASILIC VEIN TRANSPOSITION FIRST STAGE RIGHT ARM;  Surgeon: Conrad [place], MD;  Location: [facility];  Service: Vascular;  Laterality: Right;  . BASCILIC VEIN TRANSPOSITION Right 02/01/2018   Procedure: SECOND STAGE BASILIC VEIN TRANSPOSITION RIGHT UPPER EXTREMITY;  Surgeon: Marty Heck, MD;  Location: [facility];  Service: Vascular;  Laterality: Right;  . CHOLECYSTECTOMY OPEN    . COLON SURGERY    . EXCISIONAL TOTAL KNEE ARTHROPLASTY WITH ANTIBIOTIC SPACERS Left 02/06/2017   Procedure: Incisional total iknee with antibiotic spacer ;  Surgeon: Leandrew Koyanagi, MD;  Location: [facility];  Service: Orthopedics;  Laterality: Left;  . IR FLUORO GUIDE CV LINE RIGHT  02/10/2017  . IR REMOVAL TUN CV CATH W/O FL  04/01/2017  . IR US GUIDE VASC ACCESS RIGHT  02/10/2017  . IRRIGATION AND DEBRIDEMENT KNEE Left  03/12/2017   Procedure: IRRIGATION AND DEBRIDEMENT LEFT KNEE WITH WOUND VAC APPLICATION;  Surgeon: Leandrew Koyanagi, MD;  Location: [facility];  Service: Orthopedics;  Laterality: Left;  . JOINT REPLACEMENT    . KNEE ARTHROSCOPY Right 08/10/2014   Procedure: ARTHROSCOPY I & D KNEE;  Surgeon: Marianna Payment, MD;  [facility];  Service: Orthopedics;  Laterality: Right;  . KNEE ARTHROSCOPY Left 08/11/2014   Procedure: ARTHROSCOPIC WASHOUT LEFT KNEE;  Surgeon: Marianna Payment, MD;  Location: [facility];  Service: Orthopedics;  Laterality: Left;  . KNEE ARTHROSCOPY Left 08/19/2014   Procedure: ARTHROSCOPIC WASHOUT LEFT KNEE;  Surgeon: Leandrew Koyanagi, MD;  Location: [facility];  Service:  Orthopedics;  Laterality: Left;  . KNEE ARTHROSCOPY WITH LATERAL MENISECTOMY Left 04/04/2015   Procedure: AND PARTIAL LATERAL MENISECTOMY;  Surgeon: Leandrew Koyanagi, MD;  Location: [facility];  Service: Orthopedics;  Laterality: Left;  . KNEE ARTHROSCOPY WITH MEDIAL MENISECTOMY Left 04/04/2015   Procedure: LEFT KNEE ARTHROSCOPY WITH PARTIAL MEDIAL MENISCECTOMY  AND SYNOVECTOMY;  Surgeon: Leandrew Koyanagi, MD;  Location: [facility];  Service: Orthopedics;  Laterality: Left;  . PERIPHERAL VASCULAR BALLOON ANGIOPLASTY  07/07/2018   Procedure: PERIPHERAL VASCULAR BALLOON ANGIOPLASTY;  Surgeon: Marty Heck, MD;  Location: [facility];  Service: Cardiovascular;;  right AV fistula  . SHOULDER ARTHROSCOPY Bilateral 08/10/2014   Procedure: I & D BILATERAL SHOULDERS ;  Surgeon: Marianna Payment, MD;  [facility];  Service: Orthopedics;  Laterality: Bilateral;  . SMALL INTESTINE SURGERY     Due to Small Bowel Obstruction; "fixed it when they did my gallbladder OR"  . TEE WITHOUT CARDIOVERSION N/A 08/14/2014   Procedure: TRANSESOPHAGEAL ECHOCARDIOGRAM (TEE);  Surgeon: Thayer Headings, MD;  Location: [facility];  Service: Cardiovascular;  Laterality: N/A;  . TENOSYNOVECTOMY Right 08/11/2014   Procedure: RIGHT WRIST IRRIGATION AND DEBRIDEMENT, TENOSYNOVECTOMY;  Surgeon: Marianna Payment, MD;  Location: [facility];  Service: Orthopedics;  Laterality: Right;  . TOTAL KNEE ARTHROPLASTY Left 11/07/2015   Procedure: LEFT TOTAL KNEE ARTHROPLASTY WITH REVISION OF IMPLANTS;  Surgeon: Leandrew Koyanagi, MD;  Location: [facility];  Service: Orthopedics;  Laterality: Left;  . TUBAL LIGATION      There were no vitals filed for this visit.  Subjective Assessment - 07/14/18 1035    Subjective  Pt did not think they got prosthetic weight right at dialysis.    Patient is accompained by:  Family member    Pertinent History  left TFA, DM, neuropathy, Hep C, HTN, CHF, depression, CKD st 5 not  dialysis, arthritis, heart murmur,     Limitations  Standing;Walking;House hold activities    Patient Stated Goals  To walk with prosthesis in home & community, travel plane, car & cruise in September    Currently in Pain?  No/denies                       [facility] Adult PT Treatment/Exercise - 07/14/18 0001      Ambulation/Gait   Ambulation/Gait  Yes    Ambulation/Gait Assistance  5: Supervision    Ambulation/Gait Assistance Details  multimodal cues for proper weight shifting, hip and knee control    Ambulation Distance (Feet)  115 Feet   x2   Assistive device  Rollator;Prosthesis    Gait Pattern  Step-through pattern;Decreased step length - right;Decreased stance time - left;Decreased hip/knee flexion - left;Decreased weight shift to left;Left circumduction;Left hip hike;Antalgic;Lateral  hip instability;Trunk flexed    Ambulation Surface  Level;Indoor      Prosthetics   Prosthetic Care Comments   Recommend pt work up to 3 hrs wear on dialysis days.                      Current prosthetic wear tolerance (days/week)   Pt wore for 6 hrs Monday but not on Tuesday due to dialysis.     Current prosthetic wear tolerance (#hours/day)   6 hrs    Residual limb condition   no skin issues    Person(s) Educated  Patient    Education Method  Explanation;Demonstration    Education Method  Verbalized understanding    Donning Prosthesis  Modified independent (device/increased time)    Doffing Prosthesis  Modified independent (device/increased time)               PT Short Term Goals - 06/28/18 1200      PT SHORT TERM GOAL #1   Title  Patient demonstrates proper donning of prosthesis with new suction ring suspension with supervision. (All STGs Target Date: 07/30/2018)    Time  1    Period  Months    Status  New    Target Date  07/30/18      PT SHORT TERM GOAL #2   Title  patient reports prosthesis wear daily >/= 8 hrs/day on nondialysis days and >/= 3hrs/day on dialysis  days    Time  1    Period  Months    Status  New    Target Date  07/30/18      PT SHORT TERM GOAL #3   Title  Patient able to reach 10" anteriorly and to floor with RW support with prosthesis safely.     Time  1    Period  Months    Status  New    Target Date  07/30/18      PT SHORT TERM GOAL #4   Title  Patient ambulates 200' with RW & prosthesis with supervision.     Time  1    Period  Months    Status  New    Target Date  07/30/18        PT Long Term Goals - 06/28/18 1200      PT LONG TERM GOAL #1   Title  Patient verbalizes & demonstrates proper prosthetic care with new prosthetic design including prosthesis use at dialysis to enable safe use of prosthesis. (All LTGs Target Date: 09/26/2018)    Time  3    Period  Months    Status  New    Target Date  09/26/18      PT LONG TERM GOAL #2   Title  Patient tolerates & reports prosthesis wear daily >75% of awake hours without skin issues or limb pain.     Time  3    Period  Months    Status  New    Target Date  09/26/18      PT LONG TERM GOAL #3   Title  Patient ambulates 500' with one seated rest <3 minutes outdoors with rollator walker & prosthesis modified independent for community mobility.     Time  3    Period  Months    Status  New    Target Date  09/26/18      PT LONG TERM GOAL #4   Title  Patient negotiates ramps, curbs with rollator walker & stairs  1 rail/cane & prosthesis modified independent to enable community mobility.     Time  3    Period  Months    Status  New    Target Date  09/26/18      PT LONG TERM GOAL #5   Title  Improve standing balance with tasks of Berg Balance Test with walker support >50/56    Time  3    Period  Months    Status  New    Target Date  09/26/18            Plan - 07/14/18 1302    Clinical Impression Statement  Skilled session focused on verifying prosthesis weight for dialysis (4.3kg), prosthetic care managment training, and gait training with rollator.  Pt  seemed to have more difficulty weight shifting to the LLE and consistently flexing knee during swing phase with rollator vs RW (used last session).  Educated pt on how to control hip movement to control prosthetic placement during gait.                                                                                               Rehab Potential  Good    PT Frequency  2x / week    PT Duration  12 weeks    PT Treatment/Interventions  ADLs/Self Care Home Management;Moist Heat;DME Instruction;Gait training;Stair training;Functional mobility training;Therapeutic activities;Therapeutic exercise;Balance training;Neuromuscular re-education;Patient/family education;Prosthetic Training;Manual techniques;Vestibular    PT Next Visit Plan  continue with prosthesis education, gait with RW and rollator, standing balance.       Patient will benefit from skilled therapeutic intervention in order to improve the following deficits and impairments:  Abnormal gait, Decreased activity tolerance, Decreased balance, Decreased endurance, Decreased knowledge of use of DME, Decreased mobility, Decreased strength, Decreased range of motion, Impaired flexibility, Prosthetic Dependency, Postural dysfunction, Increased edema, Dizziness  Visit Diagnosis: Muscle weakness (generalized)  Abnormal posture  Unsteadiness on feet  Difficulty in walking, not elsewhere classified  Other abnormalities of gait and mobility     Problem List Patient Active Problem List   Diagnosis Date Noted  . Benign essential HTN   . Stage 5 chronic kidney disease not on chronic dialysis ([facility])   . Above knee amputation status, left   . DM type 2 causing CKD stage 5 ([facility]) 03/21/2017  . Anemia of chronic disease 02/05/2017  . Dysplasia of cervix, high grade CIN 2 01/12/2017  . Chronic diastolic CHF (congestive heart failure) ([facility]) 11/08/2015  . Total knee replacement status   . Diabetic peripheral neuropathy ([place])   . Major depression,  recurrent, chronic ([place])   . Chronic kidney disease (CKD), stage IV (severe) ([place])   . Hepatitis C 08/16/2014  . Peripheral neuropathy 06/24/2013  . Compulsive tobacco user syndrome 06/24/2013    Bjorn Loser, PTA  07/14/18, 1:11 PM [facility] [address] [place], [place], [address] Phone: [phone]   Fax:  [phone]  Name: Brandy Houston MRN: [id number] Date of Birth: [birth year]

## 2018-07-15 DIAGNOSIS — N186 End stage renal disease: Secondary | ICD-10-CM | POA: Diagnosis not present

## 2018-07-15 DIAGNOSIS — D631 Anemia in chronic kidney disease: Secondary | ICD-10-CM | POA: Diagnosis not present

## 2018-07-15 DIAGNOSIS — Z23 Encounter for immunization: Secondary | ICD-10-CM | POA: Diagnosis not present

## 2018-07-15 DIAGNOSIS — D509 Iron deficiency anemia, unspecified: Secondary | ICD-10-CM | POA: Diagnosis not present

## 2018-07-15 DIAGNOSIS — E1129 Type 2 diabetes mellitus with other diabetic kidney complication: Secondary | ICD-10-CM | POA: Diagnosis not present

## 2018-07-15 DIAGNOSIS — N2581 Secondary hyperparathyroidism of renal origin: Secondary | ICD-10-CM | POA: Diagnosis not present

## 2018-07-16 ENCOUNTER — Other Ambulatory Visit (INDEPENDENT_AMBULATORY_CARE_PROVIDER_SITE_OTHER): Payer: Self-pay | Admitting: Orthopedic Surgery

## 2018-07-17 DIAGNOSIS — R2681 Unsteadiness on feet: Secondary | ICD-10-CM | POA: Diagnosis not present

## 2018-07-17 DIAGNOSIS — N186 End stage renal disease: Secondary | ICD-10-CM | POA: Diagnosis not present

## 2018-07-17 DIAGNOSIS — R2689 Other abnormalities of gait and mobility: Secondary | ICD-10-CM | POA: Diagnosis not present

## 2018-07-17 DIAGNOSIS — Z23 Encounter for immunization: Secondary | ICD-10-CM | POA: Diagnosis not present

## 2018-07-17 DIAGNOSIS — R293 Abnormal posture: Secondary | ICD-10-CM | POA: Diagnosis not present

## 2018-07-17 DIAGNOSIS — D509 Iron deficiency anemia, unspecified: Secondary | ICD-10-CM | POA: Diagnosis not present

## 2018-07-17 DIAGNOSIS — N2581 Secondary hyperparathyroidism of renal origin: Secondary | ICD-10-CM | POA: Diagnosis not present

## 2018-07-17 DIAGNOSIS — Z992 Dependence on renal dialysis: Secondary | ICD-10-CM | POA: Diagnosis not present

## 2018-07-17 DIAGNOSIS — M6281 Muscle weakness (generalized): Secondary | ICD-10-CM | POA: Diagnosis not present

## 2018-07-17 DIAGNOSIS — R262 Difficulty in walking, not elsewhere classified: Secondary | ICD-10-CM | POA: Diagnosis not present

## 2018-07-17 DIAGNOSIS — E1129 Type 2 diabetes mellitus with other diabetic kidney complication: Secondary | ICD-10-CM | POA: Diagnosis not present

## 2018-07-17 DIAGNOSIS — D631 Anemia in chronic kidney disease: Secondary | ICD-10-CM | POA: Diagnosis not present

## 2018-07-19 ENCOUNTER — Encounter: Payer: Self-pay | Admitting: Physical Therapy

## 2018-07-19 ENCOUNTER — Ambulatory Visit: Payer: Medicare Other | Attending: Orthopedic Surgery | Admitting: Physical Therapy

## 2018-07-19 DIAGNOSIS — M6281 Muscle weakness (generalized): Secondary | ICD-10-CM

## 2018-07-19 DIAGNOSIS — R262 Difficulty in walking, not elsewhere classified: Secondary | ICD-10-CM | POA: Diagnosis not present

## 2018-07-19 DIAGNOSIS — R2681 Unsteadiness on feet: Secondary | ICD-10-CM | POA: Diagnosis not present

## 2018-07-19 DIAGNOSIS — R293 Abnormal posture: Secondary | ICD-10-CM | POA: Insufficient documentation

## 2018-07-19 DIAGNOSIS — R2689 Other abnormalities of gait and mobility: Secondary | ICD-10-CM

## 2018-07-20 DIAGNOSIS — Z23 Encounter for immunization: Secondary | ICD-10-CM | POA: Diagnosis not present

## 2018-07-20 DIAGNOSIS — D509 Iron deficiency anemia, unspecified: Secondary | ICD-10-CM | POA: Diagnosis not present

## 2018-07-20 DIAGNOSIS — N2581 Secondary hyperparathyroidism of renal origin: Secondary | ICD-10-CM | POA: Diagnosis not present

## 2018-07-20 DIAGNOSIS — N186 End stage renal disease: Secondary | ICD-10-CM | POA: Diagnosis not present

## 2018-07-20 DIAGNOSIS — D631 Anemia in chronic kidney disease: Secondary | ICD-10-CM | POA: Diagnosis not present

## 2018-07-20 DIAGNOSIS — E1129 Type 2 diabetes mellitus with other diabetic kidney complication: Secondary | ICD-10-CM | POA: Diagnosis not present

## 2018-07-20 NOTE — Therapy (Signed)
[facility] [address] [place], [place], [address] Phone: [phone]   Fax:  [phone]  Physical Therapy Treatment  Patient Details  Name: Brandy Houston MRN: [id number] Date of Birth: 06-15-1956 Referring Provider (PT): Meridee Score, MD   Encounter Date: 07/19/2018  PT End of Session - 07/19/18 1800    Visit Number  5    Number of Visits  24    Date for PT Re-Evaluation  09/26/18    Authorization Type  Medicare & Medicaid    PT Start Time  3888    PT Stop Time  1100    PT Time Calculation (min)  45 min    Equipment Utilized During Treatment  Gait belt    Activity Tolerance  Patient tolerated treatment well    Behavior During Therapy  [facility] for tasks assessed/performed       Past Medical History:  Diagnosis Date  . Anemia   . Anxiety   . Arthritis    "in my joints" (03/10/2017)  . Chronic diastolic CHF (congestive heart failure) ([place])   . CKD (chronic kidney disease), stage IV ([place])    stage IV. previous HD, none currently 10/30/15 (confirmed 02/05/2017 & 03/10/2017)  . Depression    Chronic  . Diabetic peripheral neuropathy ([place])   . Dysrhythmia    tachycardia, normal ECHO 08-09-14  . GERD (gastroesophageal reflux disease)   . Heart murmur     dx'd 02/05/2017  . Hepatitis C    "tx'd in 2016; I'm negative now" (02/05/2017)  . History of blood transfusion 2017   "w/knee replacement"  . Hypertension   . Osteoarthritis of left knee   . Peripheral neuropathy   . Protein calorie malnutrition ([place])   . Septic arthritis ([place])   . Slow transit constipation   . Type II diabetes mellitus ([name])    IDDM  . Uncontrolled hypertension 02/18/2013  . Unsteady gait     Past Surgical History:  Procedure Laterality Date  . A/V FISTULAGRAM Right 07/07/2018   Procedure: A/V FISTULAGRAM;  Surgeon: Marty Heck, MD;  Location: [facility];  Service: Cardiovascular;  Laterality: Right;  . AMPUTATION Left  03/20/2017   Procedure: LEFT ABOVE KNEE AMPUTATION;  Surgeon: Newt Minion, MD;  Location: [facility];  Service: Orthopedics;  Laterality: Left;  . APPENDECTOMY    . AV FISTULA PLACEMENT Left 09/13/2014   Procedure: Brachial Artery to Brachial Vein Gortex Four - Seven Stretch GRAFT INSERTION Left Forearm;  Surgeon: Mal Misty, MD;  Location: [place];  Service: Vascular;  Laterality: Left;  . BASCILIC VEIN TRANSPOSITION Right 10/26/2017   Procedure: BASILIC VEIN TRANSPOSITION FIRST STAGE RIGHT ARM;  Surgeon: Conrad Or[place] Hill, MD;  Location: [facility];  Service: Vascular;  Laterality: Right;  . BASCILIC VEIN TRANSPOSITION Right 02/01/2018   Procedure: SECOND STAGE BASILIC VEIN TRANSPOSITION RIGHT UPPER EXTREMITY;  Surgeon: Marty Heck, MD;  Location: [facility];  Service: Vascular;  Laterality: Right;  . CHOLECYSTECTOMY OPEN    . COLON SURGERY    . EXCISIONAL TOTAL KNEE ARTHROPLASTY WITH ANTIBIOTIC SPACERS Left 02/06/2017   Procedure: Incisional total iknee with antibiotic spacer ;  Surgeon: Leandrew Koyanagi, MD;  Location: [facility];  Service: Orthopedics;  Laterality: Left;  . IR FLUORO GUIDE CV LINE RIGHT  02/10/2017  . IR REMOVAL TUN CV CATH W/O FL  04/01/2017  . IR US GUIDE VASC ACCESS RIGHT  02/10/2017  . IRRIGATION AND DEBRIDEMENT KNEE Left  03/12/2017   Procedure: IRRIGATION AND DEBRIDEMENT LEFT KNEE WITH WOUND VAC APPLICATION;  Surgeon: Leandrew Koyanagi, MD;  Location: [facility];  Service: Orthopedics;  Laterality: Left;  . JOINT REPLACEMENT    . KNEE ARTHROSCOPY Right 08/10/2014   Procedure: ARTHROSCOPY I & D KNEE;  Surgeon: Marianna Payment, MD;  Location: [facility];  Service: Orthopedics;  Laterality: Right;  . KNEE ARTHROSCOPY Left 08/11/2014   Procedure: ARTHROSCOPIC WASHOUT LEFT KNEE;  Surgeon: Marianna Payment, MD;  Location: [facility];  Service: Orthopedics;  Laterality: Left;  . KNEE ARTHROSCOPY Left 08/19/2014   Procedure: ARTHROSCOPIC WASHOUT LEFT KNEE;  Surgeon: Leandrew Koyanagi, MD;  Location: [facility];  Service:  Orthopedics;  Laterality: Left;  . KNEE ARTHROSCOPY WITH LATERAL MENISECTOMY Left 04/04/2015   Procedure: AND PARTIAL LATERAL MENISECTOMY;  Surgeon: Leandrew Koyanagi, MD;  Location: [facility];  Service: Orthopedics;  Laterality: Left;  . KNEE ARTHROSCOPY WITH MEDIAL MENISECTOMY Left 04/04/2015   Procedure: LEFT KNEE ARTHROSCOPY WITH PARTIAL MEDIAL MENISCECTOMY  AND SYNOVECTOMY;  Surgeon: Leandrew Koyanagi, MD;  Location: [facility];  Service: Orthopedics;  Laterality: Left;  . PERIPHERAL VASCULAR BALLOON ANGIOPLASTY  07/07/2018   Procedure: PERIPHERAL VASCULAR BALLOON ANGIOPLASTY;  Surgeon: Marty Heck, MD;  Location: [facility];  Service: Cardiovascular;;  right AV fistula  . SHOULDER ARTHROSCOPY Bilateral 08/10/2014   Procedure: I & D BILATERAL SHOULDERS ;  Surgeon: Marianna Payment, MD;  Location: [facility];  Service: Orthopedics;  Laterality: Bilateral;  . SMALL INTESTINE SURGERY     Due to Small Bowel Obstruction; "fixed it when they did my gallbladder OR"  . TEE WITHOUT CARDIOVERSION N/A 08/14/2014   Procedure: TRANSESOPHAGEAL ECHOCARDIOGRAM (TEE);  Surgeon: Thayer Headings, MD;  Location: [facility];  Service: Cardiovascular;  Laterality: N/A;  . TENOSYNOVECTOMY Right 08/11/2014   Procedure: RIGHT WRIST IRRIGATION AND DEBRIDEMENT, TENOSYNOVECTOMY;  Surgeon: Marianna Payment, MD;  Location: [facility];  Service: Orthopedics;  Laterality: Right;  . TOTAL KNEE ARTHROPLASTY Left 11/07/2015   Procedure: LEFT TOTAL KNEE ARTHROPLASTY WITH REVISION OF IMPLANTS;  Surgeon: Leandrew Koyanagi, MD;  Location: [facility];  Service: Orthopedics;  Laterality: Left;  . TUBAL LIGATION      There were no vitals filed for this visit.  Subjective Assessment - 07/19/18 1016    Subjective  She is wearing prosthesis only on non-dialysis for 3-4 hrs. The prosthesis is uncomfortable sitting in it. She arrived sitting in w/c with prosthesis on limb but not properly suspended so it was  falling off.     Patient is accompained by:  Family member    Pertinent History  left TFA, DM, neuropathy, Hep C, HTN, CHF, depression, CKD st 5 not dialysis, arthritis, heart murmur,     Limitations  Standing;Walking;House hold activities    Patient Stated Goals  To walk with prosthesis in home & community, travel plane, car & cruise in September    Currently in Pain?  No/denies                       [facility] Adult PT Treatment/Exercise - 07/19/18 1015      Transfers   Transfers  Sit to Stand;Stand to Sit    Sit to Stand  5: Supervision;With upper extremity assist;With armrests;From chair/3-in-1   to RW   Sit to Stand Details  Visual cues for safe use of DME/AE;Verbal cues for sequencing;Verbal cues for technique;Verbal cues for safe  use of DME/AE    Sit to Stand Details (indicate cue type and reason)  verbal & tactile cues for prosthetic control    Stand to Sit  5: Supervision    Stand to Sit Details (indicate cue type and reason)  Visual cues for safe use of DME/AE;Visual cues/gestures for sequencing;Verbal cues for sequencing;Verbal cues for technique;Verbal cues for safe use of DME/AE    Stand to Sit Details  verbal & tactile cues for prosthetic control      Ambulation/Gait   Ambulation/Gait  Yes    Ambulation/Gait Assistance  5: Supervision    Ambulation/Gait Assistance Details  tactile & verbal cues on upright posture, glancing at floor not staring & wt shift over prosthesis in stance    Ambulation Distance (Feet)  150 Feet    Assistive device  Prosthesis;Rolling walker    Gait Pattern  Step-through pattern;Decreased step length - right;Decreased stance time - left;Decreased hip/knee flexion - left;Decreased weight shift to left;Left circumduction;Left hip hike;Antalgic;Lateral hip instability;Trunk flexed    Ambulation Surface  Indoor;Level    Ramp  5: Supervision   RW & prosthesis   Ramp Details (indicate cue type and reason)  demo, verbal & tactile on prosthetic  control & wt shift    Curb  5: Supervision   RW & prosthesis   Curb Details (indicate cue type and reason)  demo, verbal & tactile on prosthetic control & wt shift      Neuro Re-ed    Neuro Re-ed Details   demo & verbal cues on prosthetic foot position for static stance to facilitate prosthetic knee extension. Pt return demo but required verbal cues      Prosthetics   Prosthetic Care Comments   Verbal & tactile cues on proper technique to donne prosthesis with return demo understanding tactile / verbal cues.  PT instructed in sitting positioning including use of rotator so prosthetic weight not pulling downward. Pt reports significantly improved comfort in sitting.  Weighing & weigh-in/weigh-out with prosthesis at dialysis.     Current prosthetic wear tolerance (days/week)   wearing only on nondialysis days    Current prosthetic wear tolerance (#hours/day)   depends on staying home (2-3 hrs) or going out (up to 5 hrs)    PT recommended 4-5hrs 2x/day   Residual limb condition   no skin issues    Education Provided  Skin check;Residual limb care;Proper Donning;Proper wear schedule/adjustment;Other (comment)   see prothetic care comments   Person(s) Educated  Patient    Education Method  Explanation;Demonstration;Tactile cues;Verbal cues    Education Method  Verbalized understanding;Returned demonstration;Tactile cues required;Verbal cues required;Needs further instruction    Donning Prosthesis  Supervision    Doffing Prosthesis  Modified independent (device/increased time)               PT Short Term Goals - 07/19/18 1800      PT SHORT TERM GOAL #1   Title  Patient demonstrates proper donning of prosthesis with new suction ring suspension with supervision. (All STGs Target Date: 07/30/2018)    Time  1    Period  Months    Status  On-going    Target Date  07/30/18      PT SHORT TERM GOAL #2   Title  patient reports prosthesis wear daily >/= 8 hrs/day on nondialysis days and >/=  3hrs/day on dialysis days    Time  1    Period  Months    Status  On-going  Target Date  07/30/18      PT SHORT TERM GOAL #3   Title  Patient able to reach 10" anteriorly and to floor with RW support with prosthesis safely.     Time  1    Period  Months    Status  On-going    Target Date  07/30/18      PT SHORT TERM GOAL #4   Title  Patient ambulates 200' with RW & prosthesis with supervision.     Time  1    Period  Months    Status  On-going    Target Date  07/30/18        PT Long Term Goals - 07/19/18 1800      PT LONG TERM GOAL #1   Title  Patient verbalizes & demonstrates proper prosthetic care with new prosthetic design including prosthesis use at dialysis to enable safe use of prosthesis. (All LTGs Target Date: 09/26/2018)    Time  3    Period  Months    Status  On-going    Target Date  09/26/18      PT LONG TERM GOAL #2   Title  Patient tolerates & reports prosthesis wear daily >75% of awake hours without skin issues or limb pain.     Time  3    Period  Months    Status  On-going    Target Date  09/26/18      PT LONG TERM GOAL #3   Title  Patient ambulates 500' with one seated rest <3 minutes outdoors with rollator walker & prosthesis modified independent for community mobility.     Time  3    Period  Months    Status  On-going    Target Date  09/26/18      PT LONG TERM GOAL #4   Title  Patient negotiates ramps, curbs with rollator walker & stairs 1 rail/cane & prosthesis modified independent to enable community mobility.     Time  3    Period  Months    Status  On-going    Target Date  09/26/18      PT LONG TERM GOAL #5   Title  Improve standing balance with tasks of Berg Balance Test with walker support >50/56    Time  3    Period  Months    Status  On-going    Target Date  09/26/18            Plan - 07/19/18 1800    Clinical Impression Statement  Today's skilled session focused on educating patient in proper donning, sitting posture &  standing position with improvement. PT also worked on prosthetic gait with improved weight shift onto prosthesis.     Rehab Potential  Good    PT Frequency  2x / week    PT Duration  12 weeks    PT Treatment/Interventions  ADLs/Self Care Home Management;Moist Heat;DME Instruction;Gait training;Stair training;Functional mobility training;Therapeutic activities;Therapeutic exercise;Balance training;Neuromuscular re-education;Patient/family education;Prosthetic Training;Manual techniques;Vestibular    PT Next Visit Plan  continue with prosthesis education, gait with RW and rollator, standing balance.       Patient will benefit from skilled therapeutic intervention in order to improve the following deficits and impairments:  Abnormal gait, Decreased activity tolerance, Decreased balance, Decreased endurance, Decreased knowledge of use of DME, Decreased mobility, Decreased strength, Decreased range of motion, Impaired flexibility, Prosthetic Dependency, Postural dysfunction, Increased edema, Dizziness  Visit Diagnosis: Muscle weakness (generalized)  Abnormal posture  Unsteadiness on feet  Other abnormalities of gait and mobility     Problem List Patient Active Problem List   Diagnosis Date Noted  . Benign essential HTN   . Stage 5 chronic kidney disease not on chronic dialysis ([place])   . Above knee amputation status, left   . DM type 2 causing CKD stage 5 ([place]) 03/21/2017  . Anemia of chronic disease 02/05/2017  . Dysplasia of cervix, high grade CIN 2 01/12/2017  . Chronic diastolic CHF (congestive heart failure) ([place]) 11/08/2015  . Total knee replacement status   . Diabetic peripheral neuropathy ([place])   . Major depression, recurrent, chronic ([place])   . Chronic kidney disease (CKD), stage IV (severe) ([place])   . Hepatitis C 08/16/2014  . Peripheral neuropathy 06/24/2013  . Compulsive tobacco user syndrome 06/24/2013    Jamey Reas PT, DPT 07/20/2018, 6:26 AM  [facility] [address] [place], [place], [address] Phone: [phone]   Fax:  [phone]  Name: KALLIOPE RIESEN MRN: [id number] Date of Birth: [birth year]

## 2018-07-21 ENCOUNTER — Telehealth: Payer: Self-pay | Admitting: Family Medicine

## 2018-07-21 ENCOUNTER — Other Ambulatory Visit: Payer: Self-pay

## 2018-07-21 ENCOUNTER — Other Ambulatory Visit: Payer: Self-pay | Admitting: Family Medicine

## 2018-07-21 ENCOUNTER — Encounter: Payer: Self-pay | Admitting: Physical Therapy

## 2018-07-21 ENCOUNTER — Ambulatory Visit: Payer: Medicare Other | Admitting: Physical Therapy

## 2018-07-21 DIAGNOSIS — R293 Abnormal posture: Secondary | ICD-10-CM

## 2018-07-21 DIAGNOSIS — M6281 Muscle weakness (generalized): Secondary | ICD-10-CM

## 2018-07-21 DIAGNOSIS — R262 Difficulty in walking, not elsewhere classified: Secondary | ICD-10-CM | POA: Diagnosis not present

## 2018-07-21 DIAGNOSIS — R2689 Other abnormalities of gait and mobility: Secondary | ICD-10-CM

## 2018-07-21 DIAGNOSIS — E11319 Type 2 diabetes mellitus with unspecified diabetic retinopathy without macular edema: Secondary | ICD-10-CM

## 2018-07-21 DIAGNOSIS — R2681 Unsteadiness on feet: Secondary | ICD-10-CM | POA: Diagnosis not present

## 2018-07-21 MED ORDER — GLUCOSE BLOOD VI STRP: ORAL_STRIP | 12 refills | Status: DC

## 2018-07-21 NOTE — Telephone Encounter (Signed)
Copied from [place] [phone]. Topic: Quick Communication - Rx Refill/Question >> Jul 21, 2018 10:38 AM Carolyn Stare wrote: Medication    glucose blood (ACCU-CHEK AVIVA) test strip  Has the patient contacted their pharmacy yes   Preferred [facility]   Agent: Please be advised that RX refills may take up to 3 business days. We ask that you follow-up with your pharmacy.

## 2018-07-21 NOTE — Telephone Encounter (Signed)
Already ordered in another encounter. 

## 2018-07-21 NOTE — Telephone Encounter (Signed)
Copied from [place] [phone]. Topic: Quick Communication - Rx Refill/Question >> Jul 21, 2018 10:33 AM Carolyn Stare wrote: Medication   Medication    glucose blood (ACCU-CHEK AVIVA) test strip  Has the patient contacted their pharmacy yes   (Preferred Pharmacy  [facility]      Agent: Please be advised that RX refills may take up to 3 business days. We ask that you follow-up with your pharmacy.     Preferred Pharmacy (with phone number or street name): ***  Agent: Please be advised that RX refills may take up to 3 business days. We ask that you follow-up with your pharmacy.

## 2018-07-21 NOTE — Telephone Encounter (Signed)
Copied from [place]. Topic: Quick Communication - Rx Refill/Question >> Jul 21, 2018 10:24 AM Carolyn Stare wrote: Medication    glucose blood (ACCU-CHEK AVIVA) test strip  Has the patient contacted their pharmacy yes   (Preferred Pharmacy  [facility] (with phone number or street name): ***  Agent: Please be advised that RX refills may take up to 3 business days. We ask that you follow-up with your pharmacy.

## 2018-07-21 NOTE — Telephone Encounter (Unsigned)
Copied from [place] [phone]. Topic: Quick Communication - Rx Refill/Question >> Jul 21, 2018 10:20 AM Carolyn Stare wrote: Medication    glucose blood (ACCU-CHEK AVIVA) test strip  Has the patient contacted their pharmacy yes   (Preferred Pharmacy  [facility] (with phone number or street name): ***  Agent: Please be advised that RX refills may take up to 3 business days. We ask that you follow-up with your pharmacy.

## 2018-07-21 NOTE — Telephone Encounter (Signed)
Copied from [place] [phone]. Topic: Quick Communication - Rx Refill/Question >> Jul 21, 2018 10:33 AM Carolyn Stare wrote: Medication   Medication    glucose blood (ACCU-CHEK AVIVA) test strip  Has the patient contacted their pharmacy yes   (Preferred Pharmacy  [facility] (with phone number or street name): ***  Agent: Please be advised that RX refills may take up to 3 business days. We ask that you follow-up with your pharmacy.     Preferred Pharmacy (with phone number or street name): ***  Agent: Please be advised that RX refills may take up to 3 business days. We ask that you follow-up with your pharmacy.

## 2018-07-21 NOTE — Telephone Encounter (Unsigned)
Copied from [place] [phone]. Topic: Quick Communication - Rx Refill/Question >> Jul 21, 2018 10:20 AM Carolyn Stare wrote: Medication    glucose blood (ACCU-CHEK AVIVA) test strip  Has the patient contacted their pharmacy yes   (Preferred Pharmacy  [facility] (with phone number or street name): ***  Agent: Please be advised that RX refills may take up to 3 business days. We ask that you follow-up with your pharmacy.

## 2018-07-21 NOTE — Telephone Encounter (Unsigned)
Copied from [place]. Topic: Quick Communication - Rx Refill/Question >> Jul 21, 2018 10:24 AM Carolyn Stare wrote: Medication    glucose blood (ACCU-CHEK AVIVA) test strip  Has the patient contacted their pharmacy yes   (Preferred Pharmacy  [facility] (with phone number or street name): ***  Agent: Please be advised that RX refills may take up to 3 business days. We ask that you follow-up with your pharmacy.

## 2018-07-22 DIAGNOSIS — D631 Anemia in chronic kidney disease: Secondary | ICD-10-CM | POA: Diagnosis not present

## 2018-07-22 DIAGNOSIS — D509 Iron deficiency anemia, unspecified: Secondary | ICD-10-CM | POA: Diagnosis not present

## 2018-07-22 DIAGNOSIS — N2581 Secondary hyperparathyroidism of renal origin: Secondary | ICD-10-CM | POA: Diagnosis not present

## 2018-07-22 DIAGNOSIS — Z23 Encounter for immunization: Secondary | ICD-10-CM | POA: Diagnosis not present

## 2018-07-22 DIAGNOSIS — E1129 Type 2 diabetes mellitus with other diabetic kidney complication: Secondary | ICD-10-CM | POA: Diagnosis not present

## 2018-07-22 DIAGNOSIS — N186 End stage renal disease: Secondary | ICD-10-CM | POA: Diagnosis not present

## 2018-07-22 NOTE — Therapy (Signed)
[facility] [address] [place], [place], [address] Phone: [phone]   Fax:  [phone]  Physical Therapy Treatment  Patient Details  Name: Brandy Houston MRN: [id number] Date of Birth: [birth year] Referring Provider (PT): Meridee Score, MD   Encounter Date: 07/21/2018  PT End of Session - 07/21/18 1026    Visit Number  6    Number of Visits  24    Date for PT Re-Evaluation  09/26/18    Authorization Type  Medicare & Medicaid    PT Start Time  1019    PT Stop Time  1100    PT Time Calculation (min)  41 min    Equipment Utilized During Treatment  Gait belt    Activity Tolerance  Patient tolerated treatment well    Behavior During Therapy  WFL for tasks assessed/performed       Past Medical History:  Diagnosis Date  . Anemia   . Anxiety   . Arthritis    "in my joints" (03/10/2017)  . Chronic diastolic CHF (congestive heart failure) ([place])   . CKD (chronic kidney disease), stage IV ([place])    stage IV. previous HD, none currently 10/30/15 (confirmed 02/05/2017 & 03/10/2017)  . Depression    Chronic  . Diabetic peripheral neuropathy ([place])   . Dysrhythmia    tachycardia, normal ECHO 08-09-14  . GERD (gastroesophageal reflux disease)   . Heart murmur     dx'd 02/05/2017  . Hepatitis C    "tx'd in 2016; I'm negative now" (02/05/2017)  . History of blood transfusion 2017   "w/knee replacement"  . Hypertension   . Osteoarthritis of left knee   . Peripheral neuropathy   . Protein calorie malnutrition ([place])   . Septic arthritis ([place])   . Slow transit constipation   . Type II diabetes mellitus ([name])    IDDM  . Uncontrolled hypertension 02/18/2013  . Unsteady gait     Past Surgical History:  Procedure Laterality Date  . A/V FISTULAGRAM Right 07/07/2018   Procedure: A/V FISTULAGRAM;  Surgeon: Marty Heck, MD;  Location: [facility];  Service: Cardiovascular;  Laterality: Right;  . AMPUTATION Left  03/20/2017   Procedure: LEFT ABOVE KNEE AMPUTATION;  Surgeon: Newt Minion, MD;  Location: [facility];  Service: Orthopedics;  Laterality: Left;  . APPENDECTOMY    . AV FISTULA PLACEMENT Left 09/13/2014   Procedure: Brachial Artery to Brachial Vein Gortex Four - Seven Stretch GRAFT INSERTION Left Forearm;  Surgeon: Mal Misty, MD;  Location: [facility];  Service: Vascular;  Laterality: Left;  . BASCILIC VEIN TRANSPOSITION Right 10/26/2017   Procedure: BASILIC VEIN TRANSPOSITION FIRST STAGE RIGHT ARM;  Surgeon: Conrad Willard, MD;  Location: [facility];  Service: Vascular;  Laterality: Right;  . BASCILIC VEIN TRANSPOSITION Right 02/01/2018   Procedure: SECOND STAGE BASILIC VEIN TRANSPOSITION RIGHT UPPER EXTREMITY;  Surgeon: Marty Heck, MD;  Location: [facility];  Service: Vascular;  Laterality: Right;  . CHOLECYSTECTOMY OPEN    . COLON SURGERY    . EXCISIONAL TOTAL KNEE ARTHROPLASTY WITH ANTIBIOTIC SPACERS Left 02/06/2017   Procedure: Incisional total iknee with antibiotic spacer ;  Surgeon: Leandrew Koyanagi, MD;  Location: [facility];  Service: Orthopedics;  Laterality: Left;  . IR FLUORO GUIDE CV LINE RIGHT  02/10/2017  . IR REMOVAL TUN CV CATH W/O FL  04/01/2017  . IR US GUIDE VASC ACCESS RIGHT  02/10/2017  . IRRIGATION AND DEBRIDEMENT KNEE Left  03/12/2017   Procedure: IRRIGATION AND DEBRIDEMENT LEFT KNEE WITH WOUND VAC APPLICATION;  Surgeon: Leandrew Koyanagi, MD;  Location: [facility];  Service: Orthopedics;  Laterality: Left;  . JOINT REPLACEMENT    . KNEE ARTHROSCOPY Right 08/10/2014   Procedure: ARTHROSCOPY I & D KNEE;  Surgeon: Marianna Payment, MD;  Location: [facility];  Service: Orthopedics;  Laterality: Right;  . KNEE ARTHROSCOPY Left 08/11/2014   Procedure: ARTHROSCOPIC WASHOUT LEFT KNEE;  Surgeon: Marianna Payment, MD;  Location: [facility];  Service: Orthopedics;  Laterality: Left;  . KNEE ARTHROSCOPY Left 08/19/2014   Procedure: ARTHROSCOPIC WASHOUT LEFT KNEE;  Surgeon: Leandrew Koyanagi, MD;  Location: [facility];  Service:  Orthopedics;  Laterality: Left;  . KNEE ARTHROSCOPY WITH LATERAL MENISECTOMY Left 04/04/2015   Procedure: AND PARTIAL LATERAL MENISECTOMY;  Surgeon: Leandrew Koyanagi, MD;  Location: [facility];  Service: Orthopedics;  Laterality: Left;  . KNEE ARTHROSCOPY WITH MEDIAL MENISECTOMY Left 04/04/2015   Procedure: LEFT KNEE ARTHROSCOPY WITH PARTIAL MEDIAL MENISCECTOMY  AND SYNOVECTOMY;  Surgeon: Leandrew Koyanagi, MD;  Location: [facility];  Service: Orthopedics;  Laterality: Left;  . PERIPHERAL VASCULAR BALLOON ANGIOPLASTY  07/07/2018   Procedure: PERIPHERAL VASCULAR BALLOON ANGIOPLASTY;  Surgeon: Marty Heck, MD;  Location: [facility];  Service: Cardiovascular;;  right AV fistula  . SHOULDER ARTHROSCOPY Bilateral 08/10/2014   Procedure: I & D BILATERAL SHOULDERS ;  Surgeon: Marianna Payment, MD;  Location: [facility];  Service: Orthopedics;  Laterality: Bilateral;  . SMALL INTESTINE SURGERY     Due to Small Bowel Obstruction; "fixed it when they did my gallbladder OR"  . TEE WITHOUT CARDIOVERSION N/A 08/14/2014   Procedure: TRANSESOPHAGEAL ECHOCARDIOGRAM (TEE);  Surgeon: Thayer Headings, MD;  Location: [facility];  Service: Cardiovascular;  Laterality: N/A;  . TENOSYNOVECTOMY Right 08/11/2014   Procedure: RIGHT WRIST IRRIGATION AND DEBRIDEMENT, TENOSYNOVECTOMY;  Surgeon: Marianna Payment, MD;  Location: [facility];  Service: Orthopedics;  Laterality: Right;  . TOTAL KNEE ARTHROPLASTY Left 11/07/2015   Procedure: LEFT TOTAL KNEE ARTHROPLASTY WITH REVISION OF IMPLANTS;  Surgeon: Leandrew Koyanagi, MD;  Location: [facility];  Service: Orthopedics;  Laterality: Left;  . TUBAL LIGATION      There were no vitals filed for this visit.  Subjective Assessment - 07/21/18 1022    Subjective  No falls. Having increased phatom pain in left residual limb. Ran out of Gabapentin and has been having issues with getting it. Her son is on the way now to get it. Also has not checked her blood sugar  in over a week because she ran out of test strips. Can't get then until today, reports the MD is calling them in today.     Pertinent History  left TFA, DM, neuropathy, Hep C, HTN, CHF, depression, CKD st 5 not dialysis, arthritis, heart murmur,     Limitations  Standing;Walking;House hold activities    Patient Stated Goals  To walk with prosthesis in home & community, travel plane, car & cruise in September    Currently in Pain?  Yes    Pain Score  7     Pain Location  Leg   residual limb   Pain Orientation  Right    Pain Descriptors / Indicators  Aching;Sharp;Stabbing    Pain Type  Phantom pain;Chronic pain;Neuropathic pain    Pain Onset  More than a month ago    Pain Frequency  Intermittent    Aggravating Factors  not taking her medicine    Pain Relieving Factors  medication             [facility] Adult PT Treatment/Exercise - 07/21/18 1027      Transfers   Transfers  Sit to Stand;Stand to Sit    Sit to Stand  5: Supervision;With upper extremity assist;With armrests;From chair/3-in-1    Sit to Stand Details  Verbal cues for sequencing;Verbal cues for technique;Verbal cues for safe use of DME/AE    Sit to Stand Details (indicate cue type and reason)  cues for correct technique and weight shifting. needs UE support to stabilize with standing.    Stand to Sit  5: Supervision;With upper extremity assist;To chair/3-in-1    Stand to Sit Details (indicate cue type and reason)  Verbal cues for sequencing;Verbal cues for technique;Verbal cues for safe use of DME/AE    Stand to Sit Details  cues for weight shifting and controlled descent. UE support needed for stability with sitting down.       Ambulation/Gait   Ambulation/Gait  Yes    Ambulation/Gait Assistance  5: Supervision;4: Min guard    Ambulation/Gait Assistance Details  multimodal cues needed for posture, step length, step placement to increase base of support with gait, for weight shifting and to engage/use prosthetic knee with  gait.     Ambulation Distance (Feet)  150 Feet   x2   Assistive device  Prosthesis;Rolling walker    Gait Pattern  Step-through pattern;Decreased step length - right;Decreased stance time - left;Decreased hip/knee flexion - left;Decreased weight shift to left;Left circumduction;Left hip hike;Antalgic;Lateral hip instability;Trunk flexed    Ambulation Surface  Level;Indoor    Stairs  Yes    Stairs Assistance  4: Min guard    Stairs Assistance Details (indicate cue type and reason)  cues for weight shifting and hand advancement along rails.     Stair Management Technique  Two rails;Step to pattern;Forwards    Number of Stairs  4    Height of Stairs  6    Ramp  Other (comment)   min guard assist   Ramp Details (indicate cue type and reason)  with RW/prosthesis with cues on posture, step length and sequencing    Curb  Other (comment)   min guard assist   Curb Details (indicate cue type and reason)  with RW/prosthesis with cues on sequencing and stance position with RW advancement for improved balance.       Prosthetics   Current prosthetic wear tolerance (days/week)   wearing prosthesis on nondialysis days only for up to 3 hours a day, not on dialysis days at all.    Current prosthetic wear tolerance (#hours/day)   see above    Residual limb condition   no skin issues    Education Provided  Proper Donning;Correct ply sock adjustment;Proper wear schedule/adjustment;Proper weight-bearing schedule/adjustment    Person(s) Educated  Patient    Education Method  Demonstration;Explanation;Verbal cues    Education Method  Verbalized understanding;Returned demonstration;Verbal cues required;Needs further instruction    Donning Prosthesis  Supervision    Doffing Prosthesis  Modified independent (device/increased time)           PT Short Term Goals - 07/19/18 1800      PT SHORT TERM GOAL #1   Title  Patient demonstrates proper donning of prosthesis with new suction ring suspension with  supervision. (All STGs Target Date: 07/30/2018)    Time  1    Period  Months    Status  On-going    Target Date  07/30/18      PT SHORT TERM GOAL #2   Title  patient reports prosthesis wear daily >/= 8 hrs/day on nondialysis days and >/= 3hrs/day on dialysis days    Time  1    Period  Months    Status  On-going    Target Date  07/30/18      PT SHORT TERM GOAL #3   Title  Patient able to reach 10" anteriorly and to floor with RW support with prosthesis safely.     Time  1    Period  Months    Status  On-going    Target Date  07/30/18      PT SHORT TERM GOAL #4   Title  Patient ambulates 200' with RW & prosthesis with supervision.     Time  1    Period  Months    Status  On-going    Target Date  07/30/18        PT Long Term Goals - 07/19/18 1800      PT LONG TERM GOAL #1   Title  Patient verbalizes & demonstrates proper prosthetic care with new prosthetic design including prosthesis use at dialysis to enable safe use of prosthesis. (All LTGs Target Date: 09/26/2018)    Time  3    Period  Months    Status  On-going    Target Date  09/26/18      PT LONG TERM GOAL #2   Title  Patient tolerates & reports prosthesis wear daily >75% of awake hours without skin issues or limb pain.     Time  3    Period  Months    Status  On-going    Target Date  09/26/18      PT LONG TERM GOAL #3   Title  Patient ambulates 500' with one seated rest <3 minutes outdoors with rollator walker & prosthesis modified independent for community mobility.     Time  3    Period  Months    Status  On-going    Target Date  09/26/18      PT LONG TERM GOAL #4   Title  Patient negotiates ramps, curbs with rollator walker & stairs 1 rail/cane & prosthesis modified independent to enable community mobility.     Time  3    Period  Months    Status  On-going    Target Date  09/26/18      PT LONG TERM GOAL #5   Title  Improve standing balance with tasks of Berg Balance Test with walker support >50/56     Time  3    Period  Months    Status  On-going    Target Date  09/26/18            Plan - 07/21/18 1026    Clinical Impression Statement  Today's skilled session continued to focus on gait/barriers with prosthesis/RW with no significant issues. Pt does need to ensure all air is out with good seal after each stand before taking a step as socket tends to come loose. The pt is progressing toward goals and should benefit from continued PT to progress toward unmet goals.     Rehab Potential  Good    PT Frequency  2x / week    PT Duration  12 weeks    PT Treatment/Interventions  ADLs/Self Care Home Management;Moist Heat;DME Instruction;Gait training;Stair training;Functional mobility training;Therapeutic activities;Therapeutic exercise;Balance training;Neuromuscular re-education;Patient/family  education;Prosthetic Training;Manual techniques;Vestibular    PT Next Visit Plan  continue with prosthesis education, gait with RW and rollator, standing balance.       Patient will benefit from skilled therapeutic intervention in order to improve the following deficits and impairments:  Abnormal gait, Decreased activity tolerance, Decreased balance, Decreased endurance, Decreased knowledge of use of DME, Decreased mobility, Decreased strength, Decreased range of motion, Impaired flexibility, Prosthetic Dependency, Postural dysfunction, Increased edema, Dizziness  Visit Diagnosis: Muscle weakness (generalized)  Abnormal posture  Unsteadiness on feet  Other abnormalities of gait and mobility     Problem List Patient Active Problem List   Diagnosis Date Noted  . Benign essential HTN   . Stage 5 chronic kidney disease not on chronic dialysis ([place])   . Above knee amputation status, left   . DM type 2 causing CKD stage 5 ([place]) 03/21/2017  . Anemia of chronic disease 02/05/2017  . Dysplasia of cervix, high grade CIN 2 01/12/2017  . Chronic diastolic CHF (congestive heart failure) ([place])  11/08/2015  . Total knee replacement status   . Diabetic peripheral neuropathy ([place])   . Major depression, recurrent, chronic ([place])   . Chronic kidney disease (CKD), stage IV (severe) ([place])   . Hepatitis C 08/16/2014  . Peripheral neuropathy 06/24/2013  . Compulsive tobacco user syndrome 06/24/2013    Willow Ora, PTA, [facility] [address], [place], [place] [address] [phone] 07/22/18, 3:16 PM   Name: Brandy Houston MRN: [id number] Date of Birth: 03-17-1956

## 2018-07-24 DIAGNOSIS — Z23 Encounter for immunization: Secondary | ICD-10-CM | POA: Diagnosis not present

## 2018-07-24 DIAGNOSIS — N2581 Secondary hyperparathyroidism of renal origin: Secondary | ICD-10-CM | POA: Diagnosis not present

## 2018-07-24 DIAGNOSIS — D631 Anemia in chronic kidney disease: Secondary | ICD-10-CM | POA: Diagnosis not present

## 2018-07-24 DIAGNOSIS — N186 End stage renal disease: Secondary | ICD-10-CM | POA: Diagnosis not present

## 2018-07-24 DIAGNOSIS — D509 Iron deficiency anemia, unspecified: Secondary | ICD-10-CM | POA: Diagnosis not present

## 2018-07-24 DIAGNOSIS — E1129 Type 2 diabetes mellitus with other diabetic kidney complication: Secondary | ICD-10-CM | POA: Diagnosis not present

## 2018-07-26 ENCOUNTER — Ambulatory Visit: Payer: Medicare Other | Admitting: Physical Therapy

## 2018-07-26 ENCOUNTER — Encounter: Payer: Self-pay | Admitting: Physical Therapy

## 2018-07-26 DIAGNOSIS — M6281 Muscle weakness (generalized): Secondary | ICD-10-CM | POA: Diagnosis not present

## 2018-07-26 DIAGNOSIS — R2689 Other abnormalities of gait and mobility: Secondary | ICD-10-CM

## 2018-07-26 DIAGNOSIS — R262 Difficulty in walking, not elsewhere classified: Secondary | ICD-10-CM | POA: Diagnosis not present

## 2018-07-26 DIAGNOSIS — R293 Abnormal posture: Secondary | ICD-10-CM

## 2018-07-26 DIAGNOSIS — R2681 Unsteadiness on feet: Secondary | ICD-10-CM | POA: Diagnosis not present

## 2018-07-26 NOTE — Therapy (Signed)
[facility] [address] [place], [place], [address] Phone: [phone]   Fax:  [phone]  Physical Therapy Treatment  Patient Details  Name: Brandy Houston MRN: [id number] Date of Birth: 1955/08/11 Referring Provider (PT): Meridee Score, MD   Encounter Date: 07/26/2018  PT End of Session - 07/26/18 1022    Visit Number  7    Number of Visits  24    Date for PT Re-Evaluation  09/26/18    Authorization Type  Medicare & Medicaid    PT Start Time  8592    PT Stop Time  1055    PT Time Calculation (min)  39 min    Equipment Utilized During Treatment  Gait belt    Activity Tolerance  Patient tolerated treatment well;No increased pain;Patient limited by fatigue    Behavior During Therapy  [facility] for tasks assessed/performed       Past Medical History:  Diagnosis Date  . Anemia   . Anxiety   . Arthritis    "in my joints" (03/10/2017)  . Chronic diastolic CHF (congestive heart failure) ([place])   . CKD (chronic kidney disease), stage IV ([place])    stage IV. previous HD, none currently 10/30/15 (confirmed 02/05/2017 & 03/10/2017)  . Depression    Chronic  . Diabetic peripheral neuropathy ([place])   . Dysrhythmia    tachycardia, normal ECHO 08-09-14  . GERD (gastroesophageal reflux disease)   . Heart murmur     dx'd 02/05/2017  . Hepatitis C    "tx'd in 2016; I'm negative now" (02/05/2017)  . History of blood transfusion 2017   "w/knee replacement"  . Hypertension   . Osteoarthritis of left knee   . Peripheral neuropathy   . Protein calorie malnutrition ([place])   . Septic arthritis ([place])   . Slow transit constipation   . Type II diabetes mellitus ([name])    IDDM  . Uncontrolled hypertension 02/18/2013  . Unsteady gait     Past Surgical History:  Procedure Laterality Date  . A/V FISTULAGRAM Right 07/07/2018   Procedure: A/V FISTULAGRAM;  Surgeon: Marty Heck, MD;  Location: [facility];  Service: Cardiovascular;   Laterality: Right;  . AMPUTATION Left 03/20/2017   Procedure: LEFT ABOVE KNEE AMPUTATION;  Surgeon: Newt Minion, MD;  Location: [facility];  Service: Orthopedics;  Laterality: Left;  . APPENDECTOMY    . AV FISTULA PLACEMENT Left 09/13/2014   Procedure: Brachial Artery to Brachial Vein Gortex Four - Seven Stretch GRAFT INSERTION Left Forearm;  Surgeon: Mal Misty, MD;  Location: [facility];  Service: Vascular;  Laterality: Left;  . BASCILIC VEIN TRANSPOSITION Right 10/26/2017   Procedure: BASILIC VEIN TRANSPOSITION FIRST STAGE RIGHT ARM;  Surgeon: Conrad Pinch, MD;  Location: [facility];  Service: Vascular;  Laterality: Right;  . BASCILIC VEIN TRANSPOSITION Right 02/01/2018   Procedure: SECOND STAGE BASILIC VEIN TRANSPOSITION RIGHT UPPER EXTREMITY;  Surgeon: Marty Heck, MD;  Location: [facility];  Service: Vascular;  Laterality: Right;  . CHOLECYSTECTOMY OPEN    . COLON SURGERY    . EXCISIONAL TOTAL KNEE ARTHROPLASTY WITH ANTIBIOTIC SPACERS Left 02/06/2017   Procedure: Incisional total iknee with antibiotic spacer ;  Surgeon: Leandrew Koyanagi, MD;  Location: [facility];  Service: Orthopedics;  Laterality: Left;  . IR FLUORO GUIDE CV LINE RIGHT  02/10/2017  . IR REMOVAL TUN CV CATH W/O FL  04/01/2017  . IR US GUIDE VASC ACCESS RIGHT  02/10/2017  .  IRRIGATION AND DEBRIDEMENT KNEE Left 03/12/2017   Procedure: IRRIGATION AND DEBRIDEMENT LEFT KNEE WITH WOUND VAC APPLICATION;  Surgeon: Leandrew Koyanagi, MD;  Location: [facility];  Service: Orthopedics;  Laterality: Left;  . JOINT REPLACEMENT    . KNEE ARTHROSCOPY Right 08/10/2014   Procedure: ARTHROSCOPY I & D KNEE;  Surgeon: Marianna Payment, MD;  Location: [facility];  Service: Orthopedics;  Laterality: Right;  . KNEE ARTHROSCOPY Left 08/11/2014   Procedure: ARTHROSCOPIC WASHOUT LEFT KNEE;  Surgeon: Marianna Payment, MD;  Location: [facility];  Service: Orthopedics;  Laterality: Left;  . KNEE ARTHROSCOPY Left 08/19/2014   Procedure: ARTHROSCOPIC WASHOUT LEFT KNEE;  Surgeon:  Leandrew Koyanagi, MD;  Location: [facility];  Service: Orthopedics;  Laterality: Left;  . KNEE ARTHROSCOPY WITH LATERAL MENISECTOMY Left 04/04/2015   Procedure: AND PARTIAL LATERAL MENISECTOMY;  Surgeon: Leandrew Koyanagi, MD;  Location: [facility];  Service: Orthopedics;  Laterality: Left;  . KNEE ARTHROSCOPY WITH MEDIAL MENISECTOMY Left 04/04/2015   Procedure: LEFT KNEE ARTHROSCOPY WITH PARTIAL MEDIAL MENISCECTOMY  AND SYNOVECTOMY;  Surgeon: Leandrew Koyanagi, MD;  Location: [facility];  Service: Orthopedics;  Laterality: Left;  . PERIPHERAL VASCULAR BALLOON ANGIOPLASTY  07/07/2018   Procedure: PERIPHERAL VASCULAR BALLOON ANGIOPLASTY;  Surgeon: Marty Heck, MD;  Location: [facility];  Service: Cardiovascular;;  right AV fistula  . SHOULDER ARTHROSCOPY Bilateral 08/10/2014   Procedure: I & D BILATERAL SHOULDERS ;  Surgeon: Marianna Payment, MD;  Location: [facility];  Service: Orthopedics;  Laterality: Bilateral;  . SMALL INTESTINE SURGERY     Due to Small Bowel Obstruction; "fixed it when they did my gallbladder OR"  . TEE WITHOUT CARDIOVERSION N/A 08/14/2014   Procedure: TRANSESOPHAGEAL ECHOCARDIOGRAM (TEE);  Surgeon: Thayer Headings, MD;  Location: [facility];  Service: Cardiovascular;  Laterality: N/A;  . TENOSYNOVECTOMY Right 08/11/2014   Procedure: RIGHT WRIST IRRIGATION AND DEBRIDEMENT, TENOSYNOVECTOMY;  Surgeon: Marianna Payment, MD;  Location: [facility];  Service: Orthopedics;  Laterality: Right;  . TOTAL KNEE ARTHROPLASTY Left 11/07/2015   Procedure: LEFT TOTAL KNEE ARTHROPLASTY WITH REVISION OF IMPLANTS;  Surgeon: Leandrew Koyanagi, MD;  Location: [facility];  Service: Orthopedics;  Laterality: Left;  . TUBAL LIGATION      There were no vitals filed for this visit.  Subjective Assessment - 07/26/18 1021    Subjective  No new complaints. Phantom pain improved since getting her medicine. Blood sugars have been good (has test strips now). No falls. Wore prosthesis to  grocery store, did still use the driving cart.    Pertinent History  left TFA, DM, neuropathy, Hep C, HTN, CHF, depression, CKD st 5 not dialysis, arthritis, heart murmur,     Limitations  Standing;Walking;House hold activities    Currently in Pain?  No/denies    Pain Score  0-No pain              [facility] Adult PT Treatment/Exercise - 07/26/18 1023      Transfers   Transfers  Sit to Stand;Stand to Sit    Sit to Stand  5: Supervision;With upper extremity assist;With armrests;From chair/3-in-1    Sit to Stand Details  Verbal cues for sequencing;Verbal cues for technique;Verbal cues for safe use of DME/AE    Sit to Stand Details (indicate cue type and reason)  needs UE support to stabilize with standing    Stand to Sit  5: Supervision;With upper extremity assist;To chair/3-in-1    Stand to  Sit Details (indicate cue type and reason)  Verbal cues for sequencing;Verbal cues for technique;Verbal cues for safe use of DME/AE    Stand to Sit Details  needs UE support for stability with sitting. cues to ensure all the way at the surface prior to sitting down.       Ambulation/Gait   Ambulation/Gait  Yes    Ambulation/Gait Assistance  5: Supervision;4: Min guard    Ambulation/Gait Assistance Details  cues on posture, step placement and to ensure prosthetic foot clerance as toes tend to drag at times with swing phase.     Ambulation Distance (Feet)  225 Feet   x2 reps, 110 x1, +around gym with barriers   Assistive device  Prosthesis;Rolling walker    Gait Pattern  Step-through pattern;Decreased step length - right;Decreased stance time - left;Decreased hip/knee flexion - left;Decreased weight shift to left;Left circumduction;Left hip hike;Antalgic;Lateral hip instability;Trunk flexed    Ambulation Surface  Level;Indoor    Ramp  Other (comment)   min guard assist   Ramp Details (indicate cue type and reason)  with RW/prosthesis- cues on sequencing and to stay closer to walker    Curb  5:  Supervision    Curb Details (indicate cue type and reason)  with RW/prosthesis: cues on stance for improved balance with walker advancement      Prosthetics   Prosthetic Care Comments   reports still having issues with keeping socket secure.     Current prosthetic wear tolerance (days/week)   wearing prosthesis on nondialysis days only for up to 3 hours a day, not on dialysis days at all.    Current prosthetic wear tolerance (#hours/day)   see above    Residual limb condition   no skin issues    Donning Prosthesis  Supervision    Doffing Prosthesis  Modified independent (device/increased time)           PT Short Term Goals - 07/26/18 1022      PT SHORT TERM GOAL #1   Title  Patient demonstrates proper donning of prosthesis with new suction ring suspension with supervision. (All STGs Target Date: 07/30/2018)    Baseline  07/26/18:  pt is able to don on own and demonstrates knowledge to check suspension with each stand     Time  --    Period  --    Status  Achieved      PT SHORT TERM GOAL #2   Title  patient reports prosthesis wear daily >/= 8 hrs/day on nondialysis days and >/= 3hrs/day on dialysis days    Baseline  07/26/18: wears for 3 hours non dialysis days, non on dialysis days    Time  --    Period  --    Status  Not Met      PT SHORT TERM GOAL #3   Title  Patient able to reach 10" anteriorly and to floor with RW support with prosthesis safely.     Time  1    Period  Months    Status  On-going      PT SHORT TERM GOAL #4   Title  Patient ambulates 200' with RW & prosthesis with supervision.     Baseline  07/26/18: met in session today    Time  --    Period  --    Status  Achieved        PT Long Term Goals - 07/19/18 1800      PT LONG TERM GOAL #1  Title  Patient verbalizes & demonstrates proper prosthetic care with new prosthetic design including prosthesis use at dialysis to enable safe use of prosthesis. (All LTGs Target Date: 09/26/2018)    Time  3    Period   Months    Status  On-going    Target Date  09/26/18      PT LONG TERM GOAL #2   Title  Patient tolerates & reports prosthesis wear daily >75% of awake hours without skin issues or limb pain.     Time  3    Period  Months    Status  On-going    Target Date  09/26/18      PT LONG TERM GOAL #3   Title  Patient ambulates 500' with one seated rest <3 minutes outdoors with rollator walker & prosthesis modified independent for community mobility.     Time  3    Period  Months    Status  On-going    Target Date  09/26/18      PT LONG TERM GOAL #4   Title  Patient negotiates ramps, curbs with rollator walker & stairs 1 rail/cane & prosthesis modified independent to enable community mobility.     Time  3    Period  Months    Status  On-going    Target Date  09/26/18      PT LONG TERM GOAL #5   Title  Improve standing balance with tasks of Berg Balance Test with walker support >50/56    Time  3    Period  Months    Status  On-going    Target Date  09/26/18            Plan - 07/26/18 1022    Clinical Impression Statement  Today's skilled session focused on progress toward STGs wtih 2/4 met, 1/4 not met and remaining goal to be checked at next session.     Rehab Potential  Good    PT Frequency  2x / week    PT Duration  12 weeks    PT Treatment/Interventions  ADLs/Self Care Home Management;Moist Heat;DME Instruction;Gait training;Stair training;Functional mobility training;Therapeutic activities;Therapeutic exercise;Balance training;Neuromuscular re-education;Patient/family education;Prosthetic Training;Manual techniques;Vestibular    PT Next Visit Plan  check remaining STG and send to primary PT for updated STGs; continue with prosthesis education, gait with RW and rollator, standing balance.       Patient will benefit from skilled therapeutic intervention in order to improve the following deficits and impairments:  Abnormal gait, Decreased activity tolerance, Decreased balance,  Decreased endurance, Decreased knowledge of use of DME, Decreased mobility, Decreased strength, Decreased range of motion, Impaired flexibility, Prosthetic Dependency, Postural dysfunction, Increased edema, Dizziness  Visit Diagnosis: Muscle weakness (generalized)  Abnormal posture  Unsteadiness on feet  Other abnormalities of gait and mobility     Problem List Patient Active Problem List   Diagnosis Date Noted  . Benign essential HTN   . Stage 5 chronic kidney disease not on chronic dialysis ([place])   . Above knee amputation status, left   . DM type 2 causing CKD stage 5 ([place]) 03/21/2017  . Anemia of chronic disease 02/05/2017  . Dysplasia of cervix, high grade CIN 2 01/12/2017  . Chronic diastolic CHF (congestive heart failure) ([place]) 11/08/2015  . Total knee replacement status   . Diabetic peripheral neuropathy ([place])   . Major depression, recurrent, chronic ([place])   . Chronic kidney disease (CKD), stage IV (severe) ([place])   . Hepatitis C 08/16/2014  .  Peripheral neuropathy 06/24/2013  . Compulsive tobacco user syndrome 06/24/2013    Willow Ora, PTA, [facility] [address], [place], [place] [address] [phone] 07/26/18, 11:59 AM   Name: Brandy Houston MRN: [id number] Date of Birth: [birth year]

## 2018-07-27 DIAGNOSIS — Z23 Encounter for immunization: Secondary | ICD-10-CM | POA: Diagnosis not present

## 2018-07-27 DIAGNOSIS — N186 End stage renal disease: Secondary | ICD-10-CM | POA: Diagnosis not present

## 2018-07-27 DIAGNOSIS — E1129 Type 2 diabetes mellitus with other diabetic kidney complication: Secondary | ICD-10-CM | POA: Diagnosis not present

## 2018-07-27 DIAGNOSIS — N2581 Secondary hyperparathyroidism of renal origin: Secondary | ICD-10-CM | POA: Diagnosis not present

## 2018-07-27 DIAGNOSIS — D631 Anemia in chronic kidney disease: Secondary | ICD-10-CM | POA: Diagnosis not present

## 2018-07-27 DIAGNOSIS — D509 Iron deficiency anemia, unspecified: Secondary | ICD-10-CM | POA: Diagnosis not present

## 2018-07-28 ENCOUNTER — Ambulatory Visit: Payer: Medicare Other | Admitting: Physical Therapy

## 2018-07-28 ENCOUNTER — Encounter: Payer: Self-pay | Admitting: Physical Therapy

## 2018-07-28 DIAGNOSIS — R293 Abnormal posture: Secondary | ICD-10-CM | POA: Diagnosis not present

## 2018-07-28 DIAGNOSIS — M6281 Muscle weakness (generalized): Secondary | ICD-10-CM

## 2018-07-28 DIAGNOSIS — R2681 Unsteadiness on feet: Secondary | ICD-10-CM

## 2018-07-28 DIAGNOSIS — R2689 Other abnormalities of gait and mobility: Secondary | ICD-10-CM

## 2018-07-28 DIAGNOSIS — R262 Difficulty in walking, not elsewhere classified: Secondary | ICD-10-CM | POA: Diagnosis not present

## 2018-07-29 DIAGNOSIS — N2581 Secondary hyperparathyroidism of renal origin: Secondary | ICD-10-CM | POA: Diagnosis not present

## 2018-07-29 DIAGNOSIS — E1129 Type 2 diabetes mellitus with other diabetic kidney complication: Secondary | ICD-10-CM | POA: Diagnosis not present

## 2018-07-29 DIAGNOSIS — N186 End stage renal disease: Secondary | ICD-10-CM | POA: Diagnosis not present

## 2018-07-29 DIAGNOSIS — D509 Iron deficiency anemia, unspecified: Secondary | ICD-10-CM | POA: Diagnosis not present

## 2018-07-29 DIAGNOSIS — Z23 Encounter for immunization: Secondary | ICD-10-CM | POA: Diagnosis not present

## 2018-07-29 DIAGNOSIS — D631 Anemia in chronic kidney disease: Secondary | ICD-10-CM | POA: Diagnosis not present

## 2018-07-29 NOTE — Therapy (Signed)
[facility] [address] [place], [place], [address] Phone: [phone]   Fax:  [phone]  Physical Therapy Treatment  Patient Details  Name: Brandy Houston MRN: [id number] Date of Birth: 1955-08-12 Referring Provider (PT): Meridee Score, MD   Encounter Date: 07/28/2018  PT End of Session - 07/28/18 1018    Visit Number  8    Number of Visits  24    Date for PT Re-Evaluation  09/26/18    Authorization Type  Medicare & Medicaid    PT Start Time  1016    PT Stop Time  1056    PT Time Calculation (min)  40 min    Equipment Utilized During Treatment  Gait belt    Activity Tolerance  Patient tolerated treatment well;No increased pain;Patient limited by fatigue    Behavior During Therapy  [facility] for tasks assessed/performed       Past Medical History:  Diagnosis Date  . Anemia   . Anxiety   . Arthritis    "in my joints" (03/10/2017)  . Chronic diastolic CHF (congestive heart failure) ([place])   . CKD (chronic kidney disease), stage IV ([place])    stage IV. previous HD, none currently 10/30/15 (confirmed 02/05/2017 & 03/10/2017)  . Depression    Chronic  . Diabetic peripheral neuropathy ([place])   . Dysrhythmia    tachycardia, normal ECHO 08-09-14  . GERD (gastroesophageal reflux disease)   . Heart murmur     dx'd 02/05/2017  . Hepatitis C    "tx'd in 2016; I'm negative now" (02/05/2017)  . History of blood transfusion 2017   "w/knee replacement"  . Hypertension   . Osteoarthritis of left knee   . Peripheral neuropathy   . Protein calorie malnutrition ([place])   . Septic arthritis ([place])   . Slow transit constipation   . Type II diabetes mellitus ([name])    IDDM  . Uncontrolled hypertension 02/18/2013  . Unsteady gait     Past Surgical History:  Procedure Laterality Date  . A/V FISTULAGRAM Right 07/07/2018   Procedure: A/V FISTULAGRAM;  Surgeon: Marty Heck, MD;  Location: [facility];  Service: Cardiovascular;   Laterality: Right;  . AMPUTATION Left 03/20/2017   Procedure: LEFT ABOVE KNEE AMPUTATION;  Surgeon: Newt Minion, MD;  Location: [facility];  Service: Orthopedics;  Laterality: Left;  . APPENDECTOMY    . AV FISTULA PLACEMENT Left 09/13/2014   Procedure: Brachial Artery to Brachial Vein Gortex Four - Seven Stretch GRAFT INSERTION Left Forearm;  Surgeon: Mal Misty, MD;  Location: [facility];  Service: Vascular;  Laterality: Left;  . BASCILIC VEIN TRANSPOSITION Right 10/26/2017   Procedure: BASILIC VEIN TRANSPOSITION FIRST STAGE RIGHT ARM;  Surgeon: Conrad Luzerne, MD;  Location: [facility];  Service: Vascular;  Laterality: Right;  . BASCILIC VEIN TRANSPOSITION Right 02/01/2018   Procedure: SECOND STAGE BASILIC VEIN TRANSPOSITION RIGHT UPPER EXTREMITY;  Surgeon: Marty Heck, MD;  Location: [facility];  Service: Vascular;  Laterality: Right;  . CHOLECYSTECTOMY OPEN    . COLON SURGERY    . EXCISIONAL TOTAL KNEE ARTHROPLASTY WITH ANTIBIOTIC SPACERS Left 02/06/2017   Procedure: Incisional total iknee with antibiotic spacer ;  Surgeon: Leandrew Koyanagi, MD;  Location: [facility];  Service: Orthopedics;  Laterality: Left;  . IR FLUORO GUIDE CV LINE RIGHT  02/10/2017  . IR REMOVAL TUN CV CATH W/O FL  04/01/2017  . IR US GUIDE VASC ACCESS RIGHT  02/10/2017  .  IRRIGATION AND DEBRIDEMENT KNEE Left 03/12/2017   Procedure: IRRIGATION AND DEBRIDEMENT LEFT KNEE WITH WOUND VAC APPLICATION;  Surgeon: Leandrew Koyanagi, MD;  Location: [facility];  Service: Orthopedics;  Laterality: Left;  . JOINT REPLACEMENT    . KNEE ARTHROSCOPY Right 08/10/2014   Procedure: ARTHROSCOPY I & D KNEE;  Surgeon: Marianna Payment, MD;  Location: [facility];  Service: Orthopedics;  Laterality: Right;  . KNEE ARTHROSCOPY Left 08/11/2014   Procedure: ARTHROSCOPIC WASHOUT LEFT KNEE;  Surgeon: Marianna Payment, MD;  Location: [facility];  Service: Orthopedics;  Laterality: Left;  . KNEE ARTHROSCOPY Left 08/19/2014   Procedure: ARTHROSCOPIC WASHOUT LEFT KNEE;  Surgeon:  Leandrew Koyanagi, MD;  Location: [facility];  Service: Orthopedics;  Laterality: Left;  . KNEE ARTHROSCOPY WITH LATERAL MENISECTOMY Left 04/04/2015   Procedure: AND PARTIAL LATERAL MENISECTOMY;  Surgeon: Leandrew Koyanagi, MD;  Location: [facility];  Service: Orthopedics;  Laterality: Left;  . KNEE ARTHROSCOPY WITH MEDIAL MENISECTOMY Left 04/04/2015   Procedure: LEFT KNEE ARTHROSCOPY WITH PARTIAL MEDIAL MENISCECTOMY  AND SYNOVECTOMY;  Surgeon: Leandrew Koyanagi, MD;  Location: [facility];  Service: Orthopedics;  Laterality: Left;  . PERIPHERAL VASCULAR BALLOON ANGIOPLASTY  07/07/2018   Procedure: PERIPHERAL VASCULAR BALLOON ANGIOPLASTY;  Surgeon: Marty Heck, MD;  Location: [facility];  Service: Cardiovascular;;  right AV fistula  . SHOULDER ARTHROSCOPY Bilateral 08/10/2014   Procedure: I & D BILATERAL SHOULDERS ;  Surgeon: Marianna Payment, MD;  Location: [facility];  Service: Orthopedics;  Laterality: Bilateral;  . SMALL INTESTINE SURGERY     Due to Small Bowel Obstruction; "fixed it when they did my gallbladder OR"  . TEE WITHOUT CARDIOVERSION N/A 08/14/2014   Procedure: TRANSESOPHAGEAL ECHOCARDIOGRAM (TEE);  Surgeon: Thayer Headings, MD;  Location: [facility];  Service: Cardiovascular;  Laterality: N/A;  . TENOSYNOVECTOMY Right 08/11/2014   Procedure: RIGHT WRIST IRRIGATION AND DEBRIDEMENT, TENOSYNOVECTOMY;  Surgeon: Marianna Payment, MD;  Location: [facility];  Service: Orthopedics;  Laterality: Right;  . TOTAL KNEE ARTHROPLASTY Left 11/07/2015   Procedure: LEFT TOTAL KNEE ARTHROPLASTY WITH REVISION OF IMPLANTS;  Surgeon: Leandrew Koyanagi, MD;  Location: [facility];  Service: Orthopedics;  Laterality: Left;  . TUBAL LIGATION      There were no vitals filed for this visit.  Subjective Assessment - 07/28/18 1018    Subjective  No new complaints. No falls or pain to report. Does report she feels tired today.     Pertinent History  left TFA, DM, neuropathy, Hep C, HTN, CHF,  depression, CKD st 5 not dialysis, arthritis, heart murmur,     Limitations  Standing;Walking;House hold activities    Patient Stated Goals  To walk with prosthesis in home & community, travel plane, car & cruise in September    Currently in Pain?  No/denies    Pain Score  0-No pain           [facility] Adult PT Treatment/Exercise - 07/28/18 1019      Transfers   Transfers  Sit to Stand;Stand to Sit    Sit to Stand  5: Supervision;With upper extremity assist;With armrests;From chair/3-in-1    Sit to Stand Details  Verbal cues for sequencing;Verbal cues for technique;Verbal cues for safe use of DME/AE    Sit to Stand Details (indicate cue type and reason)  Needs UE support for stability upon standing.    Stand to Sit  5: Supervision;With upper extremity assist;To chair/3-in-1  Stand to Sit Details (indicate cue type and reason)  Verbal cues for sequencing;Verbal cues for technique;Verbal cues for safe use of DME/AE    Stand to Sit Details  needs UE support for stability with sitting       Ambulation/Gait   Ambulation/Gait  Yes    Ambulation/Gait Assistance  5: Supervision;4: Min guard    Ambulation/Gait Assistance Details  continues to need cues on posture, step placement and to bend prosthetic knee with swing phase of gait.     Ambulation Distance (Feet)  440 Feet   x1, around gym with activity   Assistive device  Prosthesis;Rolling walker    Gait Pattern  Step-through pattern;Decreased step length - right;Decreased stance time - left;Decreased hip/knee flexion - left;Decreased weight shift to left;Left circumduction;Left hip hike;Antalgic;Lateral hip instability;Trunk flexed    Ambulation Surface  Level;Indoor      High Level Balance   High Level Balance Activities  Side stepping;Backward walking    High Level Balance Comments  in parallel bars with UE support: side stepping with light UE support for 3 laps each way, then backwards walking for 4 laps with bil UE support, cues on step  length and weight shifting with both activities.       Therapeutic Activites    Therapeutic Activities  Other Therapeutic Activities    Other Therapeutic Activities  with RW support: able to reach to floor and > 10 inches fwd with supervision.       Prosthetics   Current prosthetic wear tolerance (days/week)   wearing prosthesis on nondialysis days only for up to 3 hours a day, not on dialysis days at all.    Current prosthetic wear tolerance (#hours/day)   see above    Residual limb condition   no skin issues    Donning Prosthesis  Supervision    Doffing Prosthesis  Modified independent (device/increased time)          Balance Exercises - 07/28/18 1053      Balance Exercises: Standing   Standing Eyes Opened  Wide (BOA);Solid surface;Limitations;Other reps (comment)    Standing Eyes Closed  Wide (BOA);Head turns;Solid surface;Other reps (comment);30 secs;Limitations      Balance Exercises: Standing   Standing Eyes Opened Limitations  feet hip width apart on floor, light UE support: alternating UE raises, then bil UE raises. cues on posture, weight shifting     Standing Eyes Closed Limitations  on floor with feet hip width apart, light fingertip support on bars: EC no head movements, progressing to EC head movements left<>right, then up<>down. min to mod assist for balance with cues on posture and weight shifting for balance.           PT Short Term Goals - 07/28/18 1020      PT SHORT TERM GOAL #1   Title  Patient demonstrates proper donning of prosthesis with new suction ring suspension with supervision. (All STGs Target Date: 07/30/2018)    Baseline  07/26/18:  pt is able to don on own and demonstrates knowledge to check suspension with each stand     Status  Achieved      PT SHORT TERM GOAL #2   Title  patient reports prosthesis wear daily >/= 8 hrs/day on nondialysis days and >/= 3hrs/day on dialysis days    Baseline  07/26/18: wears for 3 hours non dialysis days, non on  dialysis days    Status  Not Met      PT SHORT TERM GOAL #3  Title  Patient able to reach 10" anteriorly and to floor with RW support with prosthesis safely.     Time  1    Period  Months    Status  On-going      PT SHORT TERM GOAL #4   Title  Patient ambulates 200' with RW & prosthesis with supervision.     Baseline  07/26/18: met in session today    Status  Achieved        PT Long Term Goals - 07/19/18 1800      PT LONG TERM GOAL #1   Title  Patient verbalizes & demonstrates proper prosthetic care with new prosthetic design including prosthesis use at dialysis to enable safe use of prosthesis. (All LTGs Target Date: 09/26/2018)    Time  3    Period  Months    Status  On-going    Target Date  09/26/18      PT LONG TERM GOAL #2   Title  Patient tolerates & reports prosthesis wear daily >75% of awake hours without skin issues or limb pain.     Time  3    Period  Months    Status  On-going    Target Date  09/26/18      PT LONG TERM GOAL #3   Title  Patient ambulates 500' with one seated rest <3 minutes outdoors with rollator walker & prosthesis modified independent for community mobility.     Time  3    Period  Months    Status  On-going    Target Date  09/26/18      PT LONG TERM GOAL #4   Title  Patient negotiates ramps, curbs with rollator walker & stairs 1 rail/cane & prosthesis modified independent to enable community mobility.     Time  3    Period  Months    Status  On-going    Target Date  09/26/18      PT LONG TERM GOAL #5   Title  Improve standing balance with tasks of [place] Balance Test with walker support >50/56    Time  3    Period  Months    Status  On-going    Target Date  09/26/18            Plan - 07/28/18 1019    Clinical Impression Statement  Today's skilled session continued to focus on gait with RW/prosthesis and began to work on balance with decr UE support. Continues to need cues to bend prosthetic knee with gait. No issues reported or  noted with session. The pt is progressing toward goals and should benefit from continued PT to progress toward unmet goals.     Rehab Potential  Good    PT Frequency  2x / week    PT Duration  12 weeks    PT Treatment/Interventions  ADLs/Self Care Home Management;Moist Heat;DME Instruction;Gait training;Stair training;Functional mobility training;Therapeutic activities;Therapeutic exercise;Balance training;Neuromuscular re-education;Patient/family education;Prosthetic Training;Manual techniques;Vestibular    PT Next Visit Plan  continue with prosthesis education, gait with RW and rollator, standing balance.       Patient will benefit from skilled therapeutic intervention in order to improve the following deficits and impairments:  Abnormal gait, Decreased activity tolerance, Decreased balance, Decreased endurance, Decreased knowledge of use of DME, Decreased mobility, Decreased strength, Decreased range of motion, Impaired flexibility, Prosthetic Dependency, Postural dysfunction, Increased edema, Dizziness  Visit Diagnosis: Muscle weakness (generalized)  Abnormal posture  Unsteadiness on feet  Other abnormalities of  gait and mobility     Problem List Patient Active Problem List   Diagnosis Date Noted  . Benign essential HTN   . 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 Above knee amputation status, left   . DM type 2 causing CKD stage 5 ([place]) 03/21/2017  . Anemia of chronic disease 02/05/2017  . Dysplasia of cervix, high grade CIN 2 01/12/2017  . Chronic diastolic CHF (congestive heart failure) ([place]) 11/08/2015  . Total knee replacement status   . Diabetic peripheral neuropathy ([place])   . Major depression, recurrent, chronic ([place])   . Chronic kidney disease (CKD), stage IV (severe) ([place])   . Hepatitis C 08/16/2014  . Peripheral neuropathy 06/24/2013  . Compulsive tobacco user syndrome 06/24/2013    Willow Ora, PTA, [facility] [address], [place], [place] [address] [phone] 07/29/18, 10:52 AM    Name: ROBERTTA HALFHILL MRN: [id number] Date of Birth: May 30, 1956

## 2018-07-31 DIAGNOSIS — D509 Iron deficiency anemia, unspecified: Secondary | ICD-10-CM | POA: Diagnosis not present

## 2018-07-31 DIAGNOSIS — N2581 Secondary hyperparathyroidism of renal origin: Secondary | ICD-10-CM | POA: Diagnosis not present

## 2018-07-31 DIAGNOSIS — E1129 Type 2 diabetes mellitus with other diabetic kidney complication: Secondary | ICD-10-CM | POA: Diagnosis not present

## 2018-07-31 DIAGNOSIS — N186 End stage renal disease: Secondary | ICD-10-CM | POA: Diagnosis not present

## 2018-07-31 DIAGNOSIS — D631 Anemia in chronic kidney disease: Secondary | ICD-10-CM | POA: Diagnosis not present

## 2018-07-31 DIAGNOSIS — Z23 Encounter for immunization: Secondary | ICD-10-CM | POA: Diagnosis not present

## 2018-08-02 ENCOUNTER — Encounter: Payer: Self-pay | Admitting: Physical Therapy

## 2018-08-02 ENCOUNTER — Ambulatory Visit: Payer: Medicare Other | Admitting: Physical Therapy

## 2018-08-02 DIAGNOSIS — R2681 Unsteadiness on feet: Secondary | ICD-10-CM

## 2018-08-02 DIAGNOSIS — R2689 Other abnormalities of gait and mobility: Secondary | ICD-10-CM

## 2018-08-02 DIAGNOSIS — R293 Abnormal posture: Secondary | ICD-10-CM

## 2018-08-02 DIAGNOSIS — M6281 Muscle weakness (generalized): Secondary | ICD-10-CM | POA: Diagnosis not present

## 2018-08-02 DIAGNOSIS — R262 Difficulty in walking, not elsewhere classified: Secondary | ICD-10-CM | POA: Diagnosis not present

## 2018-08-02 NOTE — Therapy (Signed)
[facility] [address] [place], [place], [address] Phone: [phone]   Fax:  [phone]  Physical Therapy Treatment  Patient Details  Name: Brandy Houston MRN: [id number] Date of Birth: July 29, 1955 Referring Provider (PT): Meridee Score, MD   Encounter Date: 08/02/2018  PT End of Session - 08/02/18 1023    Visit Number  9    Number of Visits  24    Date for PT Re-Evaluation  09/26/18    Authorization Type  Medicare & Medicaid    PT Start Time  1019    PT Stop Time  1100    PT Time Calculation (min)  41 min    Equipment Utilized During Treatment  Gait belt    Activity Tolerance  Patient tolerated treatment well;No increased pain;Patient limited by fatigue    Behavior During Therapy  [facility] for tasks assessed/performed       Past Medical History:  Diagnosis Date  . Anemia   . Anxiety   . Arthritis    "in my joints" (03/10/2017)  . Chronic diastolic CHF (congestive heart failure) ([place])   . CKD (chronic kidney disease), stage IV ([place])    stage IV. previous HD, none currently 10/30/15 (confirmed 02/05/2017 & 03/10/2017)  . Depression    Chronic  . Diabetic peripheral neuropathy ([place])   . Dysrhythmia    tachycardia, normal ECHO 08-09-14  . GERD (gastroesophageal reflux disease)   . Heart murmur     dx'd 02/05/2017  . Hepatitis C    "tx'd in 2016; I'm negative now" (02/05/2017)  . History of blood transfusion 2017   "w/knee replacement"  . Hypertension   . Osteoarthritis of left knee   . Peripheral neuropathy   . Protein calorie malnutrition ([place])   . Septic arthritis ([place])   . Slow transit constipation   . Type II diabetes mellitus ([name])    IDDM  . Uncontrolled hypertension 02/18/2013  . Unsteady gait     Past Surgical History:  Procedure Laterality Date  . A/V FISTULAGRAM Right 07/07/2018   Procedure: A/V FISTULAGRAM;  Surgeon: Marty Heck, MD;  Location: [facility];  Service: Cardiovascular;   Laterality: Right;  . AMPUTATION Left 03/20/2017   Procedure: LEFT ABOVE KNEE AMPUTATION;  Surgeon: Newt Minion, MD;  Location: [facility];  Service: Orthopedics;  Laterality: Left;  . APPENDECTOMY    . AV FISTULA PLACEMENT Left 09/13/2014   Procedure: Brachial Artery to Brachial Vein Gortex Four - Seven Stretch GRAFT INSERTION Left Forearm;  Surgeon: Mal Misty, MD;  Location: [facility];  Service: Vascular;  Laterality: Left;  . BASCILIC VEIN TRANSPOSITION Right 10/26/2017   Procedure: BASILIC VEIN TRANSPOSITION FIRST STAGE RIGHT ARM;  Surgeon: Conrad Trujillo Alto, MD;  Location: [facility];  Service: Vascular;  Laterality: Right;  . BASCILIC VEIN TRANSPOSITION Right 02/01/2018   Procedure: SECOND STAGE BASILIC VEIN TRANSPOSITION RIGHT UPPER EXTREMITY;  Surgeon: Marty Heck, MD;  Location: [facility];  Service: Vascular;  Laterality: Right;  . CHOLECYSTECTOMY OPEN    . COLON SURGERY    . EXCISIONAL TOTAL KNEE ARTHROPLASTY WITH ANTIBIOTIC SPACERS Left 02/06/2017   Procedure: Incisional total iknee with antibiotic spacer ;  Surgeon: Leandrew Koyanagi, MD;  Location: [facility];  Service: Orthopedics;  Laterality: Left;  . IR FLUORO GUIDE CV LINE RIGHT  02/10/2017  . IR REMOVAL TUN CV CATH W/O FL  04/01/2017  . IR US GUIDE VASC ACCESS RIGHT  02/10/2017  .  IRRIGATION AND DEBRIDEMENT KNEE Left 03/12/2017   Procedure: IRRIGATION AND DEBRIDEMENT LEFT KNEE WITH WOUND VAC APPLICATION;  Surgeon: Leandrew Koyanagi, MD;  Location: [facility];  Service: Orthopedics;  Laterality: Left;  . JOINT REPLACEMENT    . KNEE ARTHROSCOPY Right 08/10/2014   Procedure: ARTHROSCOPY I & D KNEE;  Surgeon: Marianna Payment, MD;  Location: [facility];  Service: Orthopedics;  Laterality: Right;  . KNEE ARTHROSCOPY Left 08/11/2014   Procedure: ARTHROSCOPIC WASHOUT LEFT KNEE;  Surgeon: Marianna Payment, MD;  Location: [facility];  Service: Orthopedics;  Laterality: Left;  . KNEE ARTHROSCOPY Left 08/19/2014   Procedure: ARTHROSCOPIC WASHOUT LEFT KNEE;  Surgeon:  Leandrew Koyanagi, MD;  Location: [facility];  Service: Orthopedics;  Laterality: Left;  . KNEE ARTHROSCOPY WITH LATERAL MENISECTOMY Left 04/04/2015   Procedure: AND PARTIAL LATERAL MENISECTOMY;  Surgeon: Leandrew Koyanagi, MD;  Location: [facility];  Service: Orthopedics;  Laterality: Left;  . KNEE ARTHROSCOPY WITH MEDIAL MENISECTOMY Left 04/04/2015   Procedure: LEFT KNEE ARTHROSCOPY WITH PARTIAL MEDIAL MENISCECTOMY  AND SYNOVECTOMY;  Surgeon: Leandrew Koyanagi, MD;  Location: [facility];  Service: Orthopedics;  Laterality: Left;  . PERIPHERAL VASCULAR BALLOON ANGIOPLASTY  07/07/2018   Procedure: PERIPHERAL VASCULAR BALLOON ANGIOPLASTY;  Surgeon: Marty Heck, MD;  Location: [facility];  Service: Cardiovascular;;  right AV fistula  . SHOULDER ARTHROSCOPY Bilateral 08/10/2014   Procedure: I & D BILATERAL SHOULDERS ;  Surgeon: Marianna Payment, MD;  Location: [facility];  Service: Orthopedics;  Laterality: Bilateral;  . SMALL INTESTINE SURGERY     Due to Small Bowel Obstruction; "fixed it when they did my gallbladder OR"  . TEE WITHOUT CARDIOVERSION N/A 08/14/2014   Procedure: TRANSESOPHAGEAL ECHOCARDIOGRAM (TEE);  Surgeon: Thayer Headings, MD;  Location: [facility];  Service: Cardiovascular;  Laterality: N/A;  . TENOSYNOVECTOMY Right 08/11/2014   Procedure: RIGHT WRIST IRRIGATION AND DEBRIDEMENT, TENOSYNOVECTOMY;  Surgeon: Marianna Payment, MD;  Location: [facility];  Service: Orthopedics;  Laterality: Right;  . TOTAL KNEE ARTHROPLASTY Left 11/07/2015   Procedure: LEFT TOTAL KNEE ARTHROPLASTY WITH REVISION OF IMPLANTS;  Surgeon: Leandrew Koyanagi, MD;  Location: [facility];  Service: Orthopedics;  Laterality: Left;  . TUBAL LIGATION      There were no vitals filed for this visit.  Subjective Assessment - 08/02/18 1023    Subjective  No new complaints. No falls or pain to report.     Pertinent History  left TFA, DM, neuropathy, Hep C, HTN, CHF, depression, CKD st 5 not dialysis,  arthritis, heart murmur,     Limitations  Standing;Walking;House hold activities    Patient Stated Goals  To walk with prosthesis in home & community, travel plane, car & cruise in September    Currently in Pain?  No/denies    Pain Score  0-No pain            [facility] Adult PT Treatment/Exercise - 08/02/18 1024      Transfers   Transfers  Sit to Stand;Stand to Sit    Sit to Stand  5: Supervision;With upper extremity assist;With armrests;From chair/3-in-1    Sit to Stand Details  Verbal cues for safe use of DME/AE    Sit to Stand Details (indicate cue type and reason)  continues to need UE support to stabilize with standing    Stand to Sit  5: Supervision;With upper extremity assist;To chair/3-in-1    Stand to Sit Details (indicate cue type and  reason)  Verbal cues for safe use of DME/AE    Stand to Sit Details  needs UE support for stability with sitting down      Ambulation/Gait   Ambulation/Gait  Yes    Ambulation/Gait Assistance  4: Min guard    Ambulation/Gait Assistance Details  cues on posture, step placement, to bend prosthetic knee with swing phase and on rollator position with gait.     Ambulation Distance (Feet)  115 Feet   x2 w/rollator   Assistive device  Rollator;Prosthesis    Gait Pattern  Step-through pattern;Decreased step length - right;Decreased stance time - left;Decreased hip/knee flexion - left;Decreased weight shift to left;Left circumduction;Left hip hike;Antalgic;Lateral hip instability;Trunk flexed    Ambulation Surface  Level;Indoor    Ramp  4: Min assist    Ramp Details (indicate cue type and reason)  with rollator/prosthesis with cues on sequencing and posture. demo'd safe use of brakes.     Curb  Other (comment)   min guard assist   Curb Details (indicate cue type and reason)  with rollator/prosthesis: cues on sequencing. demo'd safe use of brakes and rollator advancement.       High Level Balance   High Level Balance Activities  Side  stepping;Negotiating over obstacles    High Level Balance Comments  at counter: side stepping with bil UE support for 3 laps each way with min guard assist, cues on form and weight shifting; then stepping over 3 bolsters of varied heights- fwd with counter/HHA x 2 laps with cues on correct sequencing, weight shifting and to keep wider base of support (prosthesis tends to come to center with intial contact after stepping over), then laterally x 2 laps with bil UE's on coutner, cues on technique and to ensure space to bring other leg over, even if extra step is needed. min guard to min assist for balance.       Prosthetics   Current prosthetic wear tolerance (days/week)   wearing prosthesis on nondialysis days only for up to 3 hours a day, not on dialysis days at all.    Current prosthetic wear tolerance (#hours/day)   see above    Residual limb condition   no skin issues    Donning Prosthesis  Supervision    Doffing Prosthesis  Modified independent (device/increased time)          PT Short Term Goals - 07/28/18 1020      PT SHORT TERM GOAL #1   Title  Patient demonstrates proper donning of prosthesis with new suction ring suspension with supervision. (All STGs Target Date: 07/30/2018)    Baseline  07/26/18:  pt is able to don on own and demonstrates knowledge to check suspension with each stand     Status  Achieved      PT SHORT TERM GOAL #2   Title  patient reports prosthesis wear daily >/= 8 hrs/day on nondialysis days and >/= 3hrs/day on dialysis days    Baseline  07/26/18: wears for 3 hours non dialysis days, non on dialysis days    Status  Not Met      PT SHORT TERM GOAL #3   Title  Patient able to reach 10" anteriorly and to floor with RW support with prosthesis safely.     Time  1    Period  Months    Status  On-going      PT SHORT TERM GOAL #4   Title  Patient ambulates 200' with RW & prosthesis with  supervision.     Baseline  07/26/18: met in session today    Status  Achieved         PT Long Term Goals - 07/19/18 1800      PT LONG TERM GOAL #1   Title  Patient verbalizes & demonstrates proper prosthetic care with new prosthetic design including prosthesis use at dialysis to enable safe use of prosthesis. (All LTGs Target Date: 09/26/2018)    Time  3    Period  Months    Status  On-going    Target Date  09/26/18      PT LONG TERM GOAL #2   Title  Patient tolerates & reports prosthesis wear daily >75% of awake hours without skin issues or limb pain.     Time  3    Period  Months    Status  On-going    Target Date  09/26/18      PT LONG TERM GOAL #3   Title  Patient ambulates 500' with one seated rest <3 minutes outdoors with rollator walker & prosthesis modified independent for community mobility.     Time  3    Period  Months    Status  On-going    Target Date  09/26/18      PT LONG TERM GOAL #4   Title  Patient negotiates ramps, curbs with rollator walker & stairs 1 rail/cane & prosthesis modified independent to enable community mobility.     Time  3    Period  Months    Status  On-going    Target Date  09/26/18      PT LONG TERM GOAL #5   Title  Improve standing balance with tasks of [place] Balance Test with walker support >50/56    Time  3    Period  Months    Status  On-going    Target Date  09/26/18            Plan - 08/02/18 1023    Clinical Impression Statement  Today's skilled session focused on use of rollator with gait/barriers and continued to work on balance reactions. No issues reported with session, however the pt does need rest breaks due to fatigue. The pt is progressing toward goals and should benefit from continued PT to progress toward unmet goals.     Rehab Potential  Good    PT Frequency  2x / week    PT Duration  12 weeks    PT Treatment/Interventions  ADLs/Self Care Home Management;Moist Heat;DME Instruction;Gait training;Stair training;Functional mobility training;Therapeutic activities;Therapeutic exercise;Balance  training;Neuromuscular re-education;Patient/family education;Prosthetic Training;Manual techniques;Vestibular    PT Next Visit Plan  continue with prosthesis education, gait with RW and rollator, standing balance.       Patient will benefit from skilled therapeutic intervention in order to improve the following deficits and impairments:  Abnormal gait, Decreased activity tolerance, Decreased balance, Decreased endurance, Decreased knowledge of use of DME, Decreased mobility, Decreased strength, Decreased range of motion, Impaired flexibility, Prosthetic Dependency, Postural dysfunction, Increased edema, Dizziness  Visit Diagnosis: Muscle weakness (generalized)  Abnormal posture  Unsteadiness on feet  Other abnormalities of gait and mobility     Problem List Patient Active Problem List   Diagnosis Date Noted  . Benign essential HTN   . 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 Above knee amputation status, left   . 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 Anemia of chronic disease  02/05/2017  . Dysplasia of cervix, high grade CIN 2 01/12/2017  . Chronic diastolic CHF (congestive heart failure) ([place]) 11/08/2015  . Total knee replacement status   . Diabetic peripheral neuropathy ([place])   . Major depression, recurrent, chronic ([place])   . Chronic kidney disease (CKD), stage IV (severe) ([place])   . Hepatitis C 08/16/2014  . Peripheral neuropathy 06/24/2013  . Compulsive tobacco user syndrome 06/24/2013    Willow Ora, PTA, [facility] [address], [place], [place] [address] [phone] 08/02/18, 4:33 PM   Name: Brandy Houston MRN: [id number] Date of Birth: December 24, 1955

## 2018-08-03 DIAGNOSIS — N2581 Secondary hyperparathyroidism of renal origin: Secondary | ICD-10-CM | POA: Diagnosis not present

## 2018-08-03 DIAGNOSIS — D509 Iron deficiency anemia, unspecified: Secondary | ICD-10-CM | POA: Diagnosis not present

## 2018-08-03 DIAGNOSIS — Z23 Encounter for immunization: Secondary | ICD-10-CM | POA: Diagnosis not present

## 2018-08-03 DIAGNOSIS — D631 Anemia in chronic kidney disease: Secondary | ICD-10-CM | POA: Diagnosis not present

## 2018-08-03 DIAGNOSIS — E1129 Type 2 diabetes mellitus with other diabetic kidney complication: Secondary | ICD-10-CM | POA: Diagnosis not present

## 2018-08-03 DIAGNOSIS — N186 End stage renal disease: Secondary | ICD-10-CM | POA: Diagnosis not present

## 2018-08-04 ENCOUNTER — Ambulatory Visit: Payer: Medicare Other | Admitting: Physical Therapy

## 2018-08-04 DIAGNOSIS — R2681 Unsteadiness on feet: Secondary | ICD-10-CM

## 2018-08-04 DIAGNOSIS — R2689 Other abnormalities of gait and mobility: Secondary | ICD-10-CM | POA: Diagnosis not present

## 2018-08-04 DIAGNOSIS — R262 Difficulty in walking, not elsewhere classified: Secondary | ICD-10-CM | POA: Diagnosis not present

## 2018-08-04 DIAGNOSIS — R293 Abnormal posture: Secondary | ICD-10-CM

## 2018-08-04 DIAGNOSIS — M6281 Muscle weakness (generalized): Secondary | ICD-10-CM

## 2018-08-04 NOTE — Therapy (Signed)
[facility] [address] [place], [place], [address] Phone: [phone]   Fax:  [phone]  Physical Therapy Treatment  Patient Details  Name: Brandy Houston MRN: [id number] Date of Birth: 1956-06-04 Referring Provider (PT): Meridee Score, MD   Encounter Date: 08/04/2018  PT End of Session - 08/04/18 1046    Visit Number  10    Number of Visits  24    Date for PT Re-Evaluation  09/26/18    Authorization Type  Medicare & Medicaid    PT Start Time  [phone]    PT Stop Time  1032    PT Time Calculation (min)  39 min    Equipment Utilized During Treatment  Gait belt    Activity Tolerance  Patient tolerated treatment well;No increased pain;Patient limited by fatigue    Behavior During Therapy  [facility] for tasks assessed/performed       Past Medical History:  Diagnosis Date  . Anemia   . Anxiety   . Arthritis    "in my joints" (03/10/2017)  . Chronic diastolic CHF (congestive heart failure) ([place])   . CKD (chronic kidney disease), stage IV ([place])    stage IV. previous HD, none currently 10/30/15 (confirmed 02/05/2017 & 03/10/2017)  . Depression    Chronic  . Diabetic peripheral neuropathy ([place])   . Dysrhythmia    tachycardia, normal ECHO 08-09-14  . GERD (gastroesophageal reflux disease)   . Heart murmur     dx'd 02/05/2017  . Hepatitis C    "tx'd in 2016; I'm negative now" (02/05/2017)  . History of blood transfusion 2017   "w/knee replacement"  . Hypertension   . Osteoarthritis of left knee   . Peripheral neuropathy   . Protein calorie malnutrition ([place])   . Septic arthritis ([place])   . Slow transit constipation   . Type II diabetes mellitus ([name])    IDDM  . Uncontrolled hypertension 02/18/2013  . Unsteady gait     Past Surgical History:  Procedure Laterality Date  . A/V FISTULAGRAM Right 07/07/2018   Procedure: A/V FISTULAGRAM;  Surgeon: Marty Heck, MD;  Location: [facility];  Service: Cardiovascular;   Laterality: Right;  . AMPUTATION Left 03/20/2017   Procedure: LEFT ABOVE KNEE AMPUTATION;  Surgeon: Newt Minion, MD;  Location: [facility];  Service: Orthopedics;  Laterality: Left;  . APPENDECTOMY    . AV FISTULA PLACEMENT Left 09/13/2014   Procedure: Brachial Artery to Brachial Vein Gortex Four - Seven Stretch GRAFT INSERTION Left Forearm;  Surgeon: Mal Misty, MD;  Location: [facility];  Service: Vascular;  Laterality: Left;  . BASCILIC VEIN TRANSPOSITION Right 10/26/2017   Procedure: BASILIC VEIN TRANSPOSITION FIRST STAGE RIGHT ARM;  Surgeon: Conrad Tyrone, MD;  Location: [facility];  Service: Vascular;  Laterality: Right;  . BASCILIC VEIN TRANSPOSITION Right 02/01/2018   Procedure: SECOND STAGE BASILIC VEIN TRANSPOSITION RIGHT UPPER EXTREMITY;  Surgeon: Marty Heck, MD;  Location: [facility];  Service: Vascular;  Laterality: Right;  . CHOLECYSTECTOMY OPEN    . COLON SURGERY    . EXCISIONAL TOTAL KNEE ARTHROPLASTY WITH ANTIBIOTIC SPACERS Left 02/06/2017   Procedure: Incisional total iknee with antibiotic spacer ;  Surgeon: Leandrew Koyanagi, MD;  Location: [facility];  Service: Orthopedics;  Laterality: Left;  . IR FLUORO GUIDE CV LINE RIGHT  02/10/2017  . IR REMOVAL TUN CV CATH W/O FL  04/01/2017  . IR US GUIDE VASC ACCESS RIGHT  02/10/2017  .  IRRIGATION AND DEBRIDEMENT KNEE Left 03/12/2017   Procedure: IRRIGATION AND DEBRIDEMENT LEFT KNEE WITH WOUND VAC APPLICATION;  Surgeon: Leandrew Koyanagi, MD;  Location: [facility];  Service: Orthopedics;  Laterality: Left;  . JOINT REPLACEMENT    . KNEE ARTHROSCOPY Right 08/10/2014   Procedure: ARTHROSCOPY I & D KNEE;  Surgeon: Marianna Payment, MD;  Location: [facility];  Service: Orthopedics;  Laterality: Right;  . KNEE ARTHROSCOPY Left 08/11/2014   Procedure: ARTHROSCOPIC WASHOUT LEFT KNEE;  Surgeon: Marianna Payment, MD;  Location: [facility];  Service: Orthopedics;  Laterality: Left;  . KNEE ARTHROSCOPY Left 08/19/2014   Procedure: ARTHROSCOPIC WASHOUT LEFT KNEE;  Surgeon:  Leandrew Koyanagi, MD;  Location: [facility];  Service: Orthopedics;  Laterality: Left;  . KNEE ARTHROSCOPY WITH LATERAL MENISECTOMY Left 04/04/2015   Procedure: AND PARTIAL LATERAL MENISECTOMY;  Surgeon: Leandrew Koyanagi, MD;  Location: [facility];  Service: Orthopedics;  Laterality: Left;  . KNEE ARTHROSCOPY WITH MEDIAL MENISECTOMY Left 04/04/2015   Procedure: LEFT KNEE ARTHROSCOPY WITH PARTIAL MEDIAL MENISCECTOMY  AND SYNOVECTOMY;  Surgeon: Leandrew Koyanagi, MD;  Location: [facility];  Service: Orthopedics;  Laterality: Left;  . PERIPHERAL VASCULAR BALLOON ANGIOPLASTY  07/07/2018   Procedure: PERIPHERAL VASCULAR BALLOON ANGIOPLASTY;  Surgeon: Marty Heck, MD;  Location: [facility];  Service: Cardiovascular;;  right AV fistula  . SHOULDER ARTHROSCOPY Bilateral 08/10/2014   Procedure: I & D BILATERAL SHOULDERS ;  Surgeon: Marianna Payment, MD;  Location: [facility];  Service: Orthopedics;  Laterality: Bilateral;  . SMALL INTESTINE SURGERY     Due to Small Bowel Obstruction; "fixed it when they did my gallbladder OR"  . TEE WITHOUT CARDIOVERSION N/A 08/14/2014   Procedure: TRANSESOPHAGEAL ECHOCARDIOGRAM (TEE);  Surgeon: [place] Headings, MD;  Location: [facility];  Service: Cardiovascular;  Laterality: N/A;  . TENOSYNOVECTOMY Right 08/11/2014   Procedure: RIGHT WRIST IRRIGATION AND DEBRIDEMENT, TENOSYNOVECTOMY;  Surgeon: Marianna Payment, MD;  Location: [facility];  Service: Orthopedics;  Laterality: Right;  . TOTAL KNEE ARTHROPLASTY Left 11/07/2015   Procedure: LEFT TOTAL KNEE ARTHROPLASTY WITH REVISION OF IMPLANTS;  Surgeon: Leandrew Koyanagi, MD;  Location: [facility];  Service: Orthopedics;  Laterality: Left;  . TUBAL LIGATION      There were no vitals filed for this visit.                    [facility] Adult PT Treatment/Exercise - 08/04/18 0001      Ambulation/Gait   Ambulation/Gait  Yes    Ambulation/Gait Assistance  5: Supervision    Ambulation/Gait Assistance  Details  cues for proper weight shifting and hip movement for more consistent L knee bend. Cues for visual scanning.                                        Ambulation Distance (Feet)  230 Feet   +100   Assistive device  Rollator;Prosthesis    Gait Pattern  Step-through pattern;Decreased step length - right;Decreased stance time - left;Decreased hip/knee flexion - left;Decreased weight shift to left;Left circumduction;Left hip hike;Antalgic;Lateral hip instability;Trunk flexed    Ambulation Surface  Level;Indoor    Ramp  5: Supervision    Ramp Details (indicate cue type and reason)  with rollator and prosthesis    Curb  5: Supervision    Curb Details (indicate cue type and  reason)  rollator with prosthesis.      Prosthetics   Prosthetic Care Comments   Encouraged pt to start to wear prothesis on dialysis days.     Current prosthetic wear tolerance (days/week)   wearing prosthesis on nondialysis days only for up to 3 hours a day, not on dialysis days at all.    Residual limb condition   no skin issues    Person(s) Educated  Patient    Education Method  Explanation    Education Method  Verbalized understanding          Balance Exercises - 08/04/18 1024      Balance Exercises: Standing   Standing Eyes Opened  Wide (BOA);Head turns/nods   + upper trunk rotations, raising ball over head, min guard to min A       PT Education - 08/04/18 1043    Education provided  Yes    Education Details  see prosthetics section.    Person(s) Educated  Patient    Methods  Explanation    Comprehension  Verbalized understanding       PT Short Term Goals - 07/28/18 1020      PT SHORT TERM GOAL #1   Title  Patient demonstrates proper donning of prosthesis with new suction ring suspension with supervision. (All STGs Target Date: 07/30/2018)    Baseline  07/26/18:  pt is able to don on own and demonstrates knowledge to check suspension with each stand     Status  Achieved      PT SHORT TERM GOAL #2    Title  patient reports prosthesis wear daily >/= 8 hrs/day on nondialysis days and >/= 3hrs/day on dialysis days    Baseline  07/26/18: wears for 3 hours non dialysis days, non on dialysis days    Status  Not Met      PT SHORT TERM GOAL #3   Title  Patient able to reach 10" anteriorly and to floor with RW support with prosthesis safely.     Time  1    Period  Months    Status  On-going      PT SHORT TERM GOAL #4   Title  Patient ambulates 200' with RW & prosthesis with supervision.     Baseline  07/26/18: met in session today    Status  Achieved        PT Long Term Goals - 07/19/18 1800      PT LONG TERM GOAL #1   Title  Patient verbalizes & demonstrates proper prosthetic care with new prosthetic design including prosthesis use at dialysis to enable safe use of prosthesis. (All LTGs Target Date: 09/26/2018)    Time  3    Period  Months    Status  On-going    Target Date  09/26/18      PT LONG TERM GOAL #2   Title  Patient tolerates & reports prosthesis wear daily >75% of awake hours without skin issues or limb pain.     Time  3    Period  Months    Status  On-going    Target Date  09/26/18      PT LONG TERM GOAL #3   Title  Patient ambulates 500' with one seated rest <3 minutes outdoors with rollator walker & prosthesis modified independent for community mobility.     Time  3    Period  Months    Status  On-going    Target Date  09/26/18  PT LONG TERM GOAL #4   Title  Patient negotiates ramps, curbs with rollator walker & stairs 1 rail/cane & prosthesis modified independent to enable community mobility.     Time  3    Period  Months    Status  On-going    Target Date  09/26/18      PT LONG TERM GOAL #5   Title  Improve standing balance with tasks of [place] Balance Test with walker support >50/56    Time  3    Period  Months    Status  On-going    Target Date  09/26/18            Plan - 08/04/18 1046    Clinical Impression Statement  Skilled session  focused on gait with rollator negotiating community barriers and working on increasing activity tolerance and proper gait mechanincs and standing balance attempting without UE support.  Pt had good carry over with safe technique with ramp and curb negotiation with rollator; pt performed at supervision level with min to no cues.  Standing  balance with upper trunk and head movement, pt was able to perform without UE support  but required min guard to min A.                                                                                 Rehab Potential  Good    PT Frequency  2x / week    PT Duration  12 weeks    PT Treatment/Interventions  ADLs/Self Care Home Management;Moist Heat;DME Instruction;Gait training;Stair training;Functional mobility training;Therapeutic activities;Therapeutic exercise;Balance training;Neuromuscular re-education;Patient/family education;Prosthetic Training;Manual techniques;Vestibular    PT Next Visit Plan  continue with prosthesis education, gait with RW and rollator, standing balance.       Patient will benefit from skilled therapeutic intervention in order to improve the following deficits and impairments:  Abnormal gait, Decreased activity tolerance, Decreased balance, Decreased endurance, Decreased knowledge of use of DME, Decreased mobility, Decreased strength, Decreased range of motion, Impaired flexibility, Prosthetic Dependency, Postural dysfunction, Increased edema, Dizziness  Visit Diagnosis: Muscle weakness (generalized)  Abnormal posture  Unsteadiness on feet  Other abnormalities of gait and mobility  Difficulty in walking, not elsewhere classified     Problem List Patient Active Problem List   Diagnosis Date Noted  . Benign essential HTN   . Stage 5 chronic kidney disease not on chronic dialysis ([place])   . Above knee amputation status, left   . DM type 2 causing CKD stage 5 ([place]) 03/21/2017  . Anemia of chronic disease 02/05/2017  . Dysplasia of  cervix, high grade CIN 2 01/12/2017  . Chronic diastolic CHF (congestive heart failure) ([place]) 11/08/2015  . Total knee replacement status   . Diabetic peripheral neuropathy ([place])   . Major depression, recurrent, chronic ([place])   . Chronic kidney disease (CKD), stage IV (severe) ([place])   . Hepatitis C 08/16/2014  . Peripheral neuropathy 06/24/2013  . Compulsive tobacco user syndrome 06/24/2013    Bjorn Loser, PTA  08/04/18, 10:53 AM [facility] [address] [place], [place], [address] Phone: [phone]   Fax:  [phone]  Name: Brandy Houston  MRN: [id number] Date of Birth: [birth year]

## 2018-08-05 DIAGNOSIS — Z23 Encounter for immunization: Secondary | ICD-10-CM | POA: Diagnosis not present

## 2018-08-05 DIAGNOSIS — N186 End stage renal disease: Secondary | ICD-10-CM | POA: Diagnosis not present

## 2018-08-05 DIAGNOSIS — D509 Iron deficiency anemia, unspecified: Secondary | ICD-10-CM | POA: Diagnosis not present

## 2018-08-05 DIAGNOSIS — D631 Anemia in chronic kidney disease: Secondary | ICD-10-CM | POA: Diagnosis not present

## 2018-08-05 DIAGNOSIS — N2581 Secondary hyperparathyroidism of renal origin: Secondary | ICD-10-CM | POA: Diagnosis not present

## 2018-08-05 DIAGNOSIS — E1129 Type 2 diabetes mellitus with other diabetic kidney complication: Secondary | ICD-10-CM | POA: Diagnosis not present

## 2018-08-07 DIAGNOSIS — D631 Anemia in chronic kidney disease: Secondary | ICD-10-CM | POA: Diagnosis not present

## 2018-08-07 DIAGNOSIS — N186 End stage renal disease: Secondary | ICD-10-CM | POA: Diagnosis not present

## 2018-08-07 DIAGNOSIS — D509 Iron deficiency anemia, unspecified: Secondary | ICD-10-CM | POA: Diagnosis not present

## 2018-08-07 DIAGNOSIS — Z23 Encounter for immunization: Secondary | ICD-10-CM | POA: Diagnosis not present

## 2018-08-07 DIAGNOSIS — N2581 Secondary hyperparathyroidism of renal origin: Secondary | ICD-10-CM | POA: Diagnosis not present

## 2018-08-07 DIAGNOSIS — E1129 Type 2 diabetes mellitus with other diabetic kidney complication: Secondary | ICD-10-CM | POA: Diagnosis not present

## 2018-08-09 ENCOUNTER — Encounter: Payer: Self-pay | Admitting: Physical Therapy

## 2018-08-09 ENCOUNTER — Ambulatory Visit: Payer: Medicare Other | Admitting: Physical Therapy

## 2018-08-09 DIAGNOSIS — M6281 Muscle weakness (generalized): Secondary | ICD-10-CM | POA: Diagnosis not present

## 2018-08-09 DIAGNOSIS — R2689 Other abnormalities of gait and mobility: Secondary | ICD-10-CM | POA: Diagnosis not present

## 2018-08-09 DIAGNOSIS — R293 Abnormal posture: Secondary | ICD-10-CM

## 2018-08-09 DIAGNOSIS — R262 Difficulty in walking, not elsewhere classified: Secondary | ICD-10-CM

## 2018-08-09 DIAGNOSIS — R2681 Unsteadiness on feet: Secondary | ICD-10-CM

## 2018-08-09 NOTE — Therapy (Signed)
[facility] [address] [place], [place], [address] Phone: [phone]   Fax:  [phone]  Physical Therapy Treatment  Patient Details  Name: Brandy Houston MRN: [id number] Date of Birth: 1955-09-12 Referring Provider (PT): Meridee Score, MD   Encounter Date: 08/09/2018  PT End of Session - 08/09/18 1102    Visit Number  11    Number of Visits  24    Date for PT Re-Evaluation  09/26/18    Authorization Type  Medicare & Medicaid    PT Start Time  1018    PT Stop Time  1100    PT Time Calculation (min)  42 min    Equipment Utilized During Treatment  Gait belt    Activity Tolerance  Patient tolerated treatment well;No increased pain;Patient limited by fatigue    Behavior During Therapy  [facility] for tasks assessed/performed       Past Medical History:  Diagnosis Date  . Anemia   . Anxiety   . Arthritis    "in my joints" (03/10/2017)  . Chronic diastolic CHF (congestive heart failure) ([place])   . CKD (chronic kidney disease), stage IV ([place])    stage IV. previous HD, none currently 10/30/15 (confirmed 02/05/2017 & 03/10/2017)  . Depression    Chronic  . Diabetic peripheral neuropathy ([place])   . Dysrhythmia    tachycardia, normal ECHO 08-09-14  . GERD (gastroesophageal reflux disease)   . Heart murmur     dx'd 02/05/2017  . Hepatitis C    "tx'd in 2016; I'm negative now" (02/05/2017)  . History of blood transfusion 2017   "w/knee replacement"  . Hypertension   . Osteoarthritis of left knee   . Peripheral neuropathy   . Protein calorie malnutrition ([place])   . Septic arthritis ([place])   . Slow transit constipation   . Type II diabetes mellitus ([name])    IDDM  . Uncontrolled hypertension 02/18/2013  . Unsteady gait     Past Surgical History:  Procedure Laterality Date  . A/V FISTULAGRAM Right 07/07/2018   Procedure: A/V FISTULAGRAM;  Surgeon: Marty Heck, MD;  Location: [facility];  Service: Cardiovascular;   Laterality: Right;  . AMPUTATION Left 03/20/2017   Procedure: LEFT ABOVE KNEE AMPUTATION;  Surgeon: Newt Minion, MD;  Location: [facility];  Service: Orthopedics;  Laterality: Left;  . APPENDECTOMY    . AV FISTULA PLACEMENT Left 09/13/2014   Procedure: Brachial Artery to Brachial Vein Gortex Four - Seven Stretch GRAFT INSERTION Left Forearm;  Surgeon: Mal Misty, MD;  Location: [facility];  Service: Vascular;  Laterality: Left;  . BASCILIC VEIN TRANSPOSITION Right 10/26/2017   Procedure: BASILIC VEIN TRANSPOSITION FIRST STAGE RIGHT ARM;  Surgeon: Conrad Dona Ana, MD;  Location: [facility];  Service: Vascular;  Laterality: Right;  . BASCILIC VEIN TRANSPOSITION Right 02/01/2018   Procedure: SECOND STAGE BASILIC VEIN TRANSPOSITION RIGHT UPPER EXTREMITY;  Surgeon: Marty Heck, MD;  Location: [facility];  Service: Vascular;  Laterality: Right;  . CHOLECYSTECTOMY OPEN    . COLON SURGERY    . EXCISIONAL TOTAL KNEE ARTHROPLASTY WITH ANTIBIOTIC SPACERS Left 02/06/2017   Procedure: Incisional total iknee with antibiotic spacer ;  Surgeon: Leandrew Koyanagi, MD;  Location: [facility];  Service: Orthopedics;  Laterality: Left;  . IR FLUORO GUIDE CV LINE RIGHT  02/10/2017  . IR REMOVAL TUN CV CATH W/O FL  04/01/2017  . IR US GUIDE VASC ACCESS RIGHT  02/10/2017  .  IRRIGATION AND DEBRIDEMENT KNEE Left 03/12/2017   Procedure: IRRIGATION AND DEBRIDEMENT LEFT KNEE WITH WOUND VAC APPLICATION;  Surgeon: Leandrew Koyanagi, MD;  Location: [facility];  Service: Orthopedics;  Laterality: Left;  . JOINT REPLACEMENT    . KNEE ARTHROSCOPY Right 08/10/2014   Procedure: ARTHROSCOPY I & D KNEE;  Surgeon: Marianna Payment, MD;  Location: [facility];  Service: Orthopedics;  Laterality: Right;  . KNEE ARTHROSCOPY Left 08/11/2014   Procedure: ARTHROSCOPIC WASHOUT LEFT KNEE;  Surgeon: Marianna Payment, MD;  Location: [facility];  Service: Orthopedics;  Laterality: Left;  . KNEE ARTHROSCOPY Left 08/19/2014   Procedure: ARTHROSCOPIC WASHOUT LEFT KNEE;  Surgeon:  Leandrew Koyanagi, MD;  Location: [facility];  Service: Orthopedics;  Laterality: Left;  . KNEE ARTHROSCOPY WITH LATERAL MENISECTOMY Left 04/04/2015   Procedure: AND PARTIAL LATERAL MENISECTOMY;  Surgeon: Leandrew Koyanagi, MD;  Location: [facility];  Service: Orthopedics;  Laterality: Left;  . KNEE ARTHROSCOPY WITH MEDIAL MENISECTOMY Left 04/04/2015   Procedure: LEFT KNEE ARTHROSCOPY WITH PARTIAL MEDIAL MENISCECTOMY  AND SYNOVECTOMY;  Surgeon: Leandrew Koyanagi, MD;  Location: [facility];  Service: Orthopedics;  Laterality: Left;  . PERIPHERAL VASCULAR BALLOON ANGIOPLASTY  07/07/2018   Procedure: PERIPHERAL VASCULAR BALLOON ANGIOPLASTY;  Surgeon: Marty Heck, MD;  Location: [facility];  Service: Cardiovascular;;  right AV fistula  . SHOULDER ARTHROSCOPY Bilateral 08/10/2014   Procedure: I & D BILATERAL SHOULDERS ;  Surgeon: Marianna Payment, MD;  Location: [facility];  Service: Orthopedics;  Laterality: Bilateral;  . SMALL INTESTINE SURGERY     Due to Small Bowel Obstruction; "fixed it when they did my gallbladder OR"  . TEE WITHOUT CARDIOVERSION N/A 08/14/2014   Procedure: TRANSESOPHAGEAL ECHOCARDIOGRAM (TEE);  Surgeon: Thayer Headings, MD;  Location: [facility];  Service: Cardiovascular;  Laterality: N/A;  . TENOSYNOVECTOMY Right 08/11/2014   Procedure: RIGHT WRIST IRRIGATION AND DEBRIDEMENT, TENOSYNOVECTOMY;  Surgeon: Marianna Payment, MD;  Location: [facility];  Service: Orthopedics;  Laterality: Right;  . TOTAL KNEE ARTHROPLASTY Left 11/07/2015   Procedure: LEFT TOTAL KNEE ARTHROPLASTY WITH REVISION OF IMPLANTS;  Surgeon: Leandrew Koyanagi, MD;  Location: [facility];  Service: Orthopedics;  Laterality: Left;  . TUBAL LIGATION      There were no vitals filed for this visit.  Subjective Assessment - 08/09/18 1022    Subjective  Nothing new to report. Pt plans to try to wear prosthesis on dialysis day.    Pertinent History  left TFA, DM, neuropathy, Hep C, HTN, CHF, depression,  CKD st 5 not dialysis, arthritis, heart murmur,     Limitations  Standing;Walking;House hold activities    Patient Stated Goals  To walk with prosthesis in home & community, travel plane, car & cruise in September    Currently in Pain?  No/denies                       [facility] Adult PT Treatment/Exercise - 08/09/18 0001      Ambulation/Gait   Ambulation/Gait  Yes    Ambulation/Gait Assistance  5: Supervision    Ambulation/Gait Assistance Details  cues to decrease UE support; practise a short distance on gravel with rollator.                           Ambulation Distance (Feet)  230 Feet    Assistive device  Rollator;Prosthesis    Gait Pattern  Step-through pattern;Decreased step length - right;Decreased stance time - left;Decreased hip/knee flexion - left;Decreased weight shift to left;Left circumduction;Left hip hike;Antalgic;Lateral hip instability;Trunk flexed    Ambulation Surface  Level;Indoor;Outdoor;Paved;Gravel    Stairs  Yes    Stairs Assistance  5: Supervision    Stair Management Technique  Two rails;Step to pattern;Forwards    Ramp  5: Supervision    Ramp Details (indicate cue type and reason)  with rollator and prosthesis    Curb  5: Supervision    Curb Details (indicate cue type and reason)  with rollator and prosthesis      Prosthetics   Prosthetic Care Comments   Pt plans to wear prosthesis on dialysis day this week..  Recommend pt increase wear time to 6 hrs/day.   Pt needed to make an adjustment with prosthesis due to it being rotated outward. pt had to doff and re don during session with cues for the need to switch out sleeves for cleaning and when donning the sleeve needs to be up right against the skin.                                                       Current prosthetic wear tolerance (days/week)   4 hrs     Residual limb condition   no skin issues    Education Provided  Prosthetic cleaning;Proper Donning;Proper wear schedule/adjustment    Person(s)  Educated  Patient    Education Method  Explanation    Education Method  Verbalized understanding    Donning Prosthesis  Supervision    Doffing Prosthesis  Modified independent (device/increased time)             PT Education - 08/09/18 1315    Education provided  Yes    Education Details  see prosthetics section.    Person(s) Educated  Patient    Methods  Explanation    Comprehension  Verbalized understanding       PT Short Term Goals - 07/28/18 1020      PT SHORT TERM GOAL #1   Title  Patient demonstrates proper donning of prosthesis with new suction ring suspension with supervision. (All STGs Target Date: 07/30/2018)    Baseline  07/26/18:  pt is able to don on own and demonstrates knowledge to check suspension with each stand     Status  Achieved      PT SHORT TERM GOAL #2   Title  patient reports prosthesis wear daily >/= 8 hrs/day on nondialysis days and >/= 3hrs/day on dialysis days    Baseline  07/26/18: wears for 3 hours non dialysis days, non on dialysis days    Status  Not Met      PT SHORT TERM GOAL #3   Title  Patient able to reach 10" anteriorly and to floor with RW support with prosthesis safely.     Time  1    Period  Months    Status  On-going      PT SHORT TERM GOAL #4   Title  Patient ambulates 200' with RW & prosthesis with supervision.     Baseline  07/26/18: met in session today    Status  Achieved        PT Long Term Goals - 07/19/18 1800      PT LONG TERM  GOAL #1   Title  Patient verbalizes & demonstrates proper prosthetic care with new prosthetic design including prosthesis use at dialysis to enable safe use of prosthesis. (All LTGs Target Date: 09/26/2018)    Time  3    Period  Months    Status  On-going    Target Date  09/26/18      PT LONG TERM GOAL #2   Title  Patient tolerates & reports prosthesis wear daily >75% of awake hours without skin issues or limb pain.     Time  3    Period  Months    Status  On-going    Target Date   09/26/18      PT LONG TERM GOAL #3   Title  Patient ambulates 500' with one seated rest <3 minutes outdoors with rollator walker & prosthesis modified independent for community mobility.     Time  3    Period  Months    Status  On-going    Target Date  09/26/18      PT LONG TERM GOAL #4   Title  Patient negotiates ramps, curbs with rollator walker & stairs 1 rail/cane & prosthesis modified independent to enable community mobility.     Time  3    Period  Months    Status  On-going    Target Date  09/26/18      PT LONG TERM GOAL #5   Title  Improve standing balance with tasks of Berg Balance Test with walker support >50/56    Time  3    Period  Months    Status  On-going    Target Date  09/26/18            Plan - 08/09/18 1316    Clinical Impression Statement  Skilled session focused on reviewing prosthetic care and wear, gait with rollator with community barriers.  Pt is still having trouble increasing wear time reporting difficulty with schedule and fatigue; pt plans to increase wear time this week.  Pt demonstrates progress with community barriers with rollator performing at supervision level.                                                         Rehab Potential  Good    PT Frequency  2x / week    PT Duration  12 weeks    PT Treatment/Interventions  ADLs/Self Care Home Management;Moist Heat;DME Instruction;Gait training;Stair training;Functional mobility training;Therapeutic activities;Therapeutic exercise;Balance training;Neuromuscular re-education;Patient/family education;Prosthetic Training;Manual techniques;Vestibular    PT Next Visit Plan  continue with prosthesis education, gait with RW and rollator, standing balance.       Patient will benefit from skilled therapeutic intervention in order to improve the following deficits and impairments:  Abnormal gait, Decreased activity tolerance, Decreased balance, Decreased endurance, Decreased knowledge of use of DME,  Decreased mobility, Decreased strength, Decreased range of motion, Impaired flexibility, Prosthetic Dependency, Postural dysfunction, Increased edema, Dizziness  Visit Diagnosis: Muscle weakness (generalized)  Abnormal posture  Unsteadiness on feet  Difficulty in walking, not elsewhere classified  Other abnormalities of gait and mobility     Problem List Patient Active Problem List   Diagnosis Date Noted  . Benign essential HTN   . Stage 5 chronic kidney disease not on chronic dialysis ([place])   . Above knee  amputation status, left   . DM type 2 causing CKD stage 5 ([place]) 03/21/2017  . Anemia of chronic disease 02/05/2017  . Dysplasia of cervix, high grade CIN 2 01/12/2017  . Chronic diastolic CHF (congestive heart failure) ([place]) 11/08/2015  . Total knee replacement status   . Diabetic peripheral neuropathy ([place])   . Major depression, recurrent, chronic ([place])   . Chronic kidney disease (CKD), stage IV (severe) ([place])   . Hepatitis C 08/16/2014  . Peripheral neuropathy 06/24/2013  . Compulsive tobacco user syndrome 06/24/2013    Bjorn Loser, PTA  08/09/18, 1:22 PM [facility] [address] [place], [place], [address] Phone: [phone]   Fax:  [phone]  Name: TRENISE TURAY MRN: [id number] Date of Birth: November 18, 1955

## 2018-08-10 ENCOUNTER — Telehealth (INDEPENDENT_AMBULATORY_CARE_PROVIDER_SITE_OTHER): Payer: Self-pay

## 2018-08-10 ENCOUNTER — Telehealth (INDEPENDENT_AMBULATORY_CARE_PROVIDER_SITE_OTHER): Payer: Self-pay | Admitting: Orthopedic Surgery

## 2018-08-10 DIAGNOSIS — E1129 Type 2 diabetes mellitus with other diabetic kidney complication: Secondary | ICD-10-CM | POA: Diagnosis not present

## 2018-08-10 DIAGNOSIS — D509 Iron deficiency anemia, unspecified: Secondary | ICD-10-CM | POA: Diagnosis not present

## 2018-08-10 DIAGNOSIS — N186 End stage renal disease: Secondary | ICD-10-CM | POA: Diagnosis not present

## 2018-08-10 DIAGNOSIS — D631 Anemia in chronic kidney disease: Secondary | ICD-10-CM | POA: Diagnosis not present

## 2018-08-10 DIAGNOSIS — N2581 Secondary hyperparathyroidism of renal origin: Secondary | ICD-10-CM | POA: Diagnosis not present

## 2018-08-10 DIAGNOSIS — Z23 Encounter for immunization: Secondary | ICD-10-CM | POA: Diagnosis not present

## 2018-08-10 NOTE — Telephone Encounter (Signed)
Patient was called to pick up Rx for wheelchair and rollator.

## 2018-08-10 NOTE — Telephone Encounter (Signed)
Pt called saying she needs a RX for wheelchair and a rollator

## 2018-08-10 NOTE — Telephone Encounter (Signed)
Done

## 2018-08-11 ENCOUNTER — Encounter: Payer: Self-pay | Admitting: Physical Therapy

## 2018-08-11 ENCOUNTER — Ambulatory Visit: Payer: Medicare Other | Admitting: Physical Therapy

## 2018-08-11 DIAGNOSIS — R2689 Other abnormalities of gait and mobility: Secondary | ICD-10-CM | POA: Diagnosis not present

## 2018-08-11 DIAGNOSIS — R293 Abnormal posture: Secondary | ICD-10-CM | POA: Diagnosis not present

## 2018-08-11 DIAGNOSIS — R2681 Unsteadiness on feet: Secondary | ICD-10-CM | POA: Diagnosis not present

## 2018-08-11 DIAGNOSIS — R262 Difficulty in walking, not elsewhere classified: Secondary | ICD-10-CM

## 2018-08-11 DIAGNOSIS — M6281 Muscle weakness (generalized): Secondary | ICD-10-CM

## 2018-08-11 NOTE — Therapy (Signed)
[facility] [address] [place], [place], [address] Phone: [phone]   Fax:  [phone]  Physical Therapy Treatment  Patient Details  Name: Brandy Houston MRN: [id number] Date of Birth: [birth year] Referring Provider (PT): Meridee Score, MD   Encounter Date: 08/11/2018  PT End of Session - 08/11/18 1032    Visit Number  11    Number of Visits  24    Date for PT Re-Evaluation  09/26/18    Authorization Type  Medicare & Medicaid    PT Start Time  0945    PT Stop Time  1023    PT Time Calculation (min)  38 min    Equipment Utilized During Treatment  Gait belt    Activity Tolerance  Patient tolerated treatment well;No increased pain;Patient limited by fatigue    Behavior During Therapy  [facility] for tasks assessed/performed       Past Medical History:  Diagnosis Date  . Anemia   . Anxiety   . Arthritis    "in my joints" (03/10/2017)  . Chronic diastolic CHF (congestive heart failure) ([place])   . CKD (chronic kidney disease), stage IV ([place])    stage IV. previous HD, none currently 10/30/15 (confirmed 02/05/2017 & 03/10/2017)  . Depression    Chronic  . Diabetic peripheral neuropathy ([place])   . Dysrhythmia    tachycardia, normal ECHO 08-09-14  . GERD (gastroesophageal reflux disease)   . Heart murmur     dx'd 02/05/2017  . Hepatitis C    "tx'd in 2016; I'm negative now" (02/05/2017)  . History of blood transfusion 2017   "w/knee replacement"  . Hypertension   . Osteoarthritis of left knee   . Peripheral neuropathy   . Protein calorie malnutrition ([place])   . Septic arthritis ([place])   . Slow transit constipation   . Type II diabetes mellitus ([name])    IDDM  . Uncontrolled hypertension 02/18/2013  . Unsteady gait     Past Surgical History:  Procedure Laterality Date  . A/V FISTULAGRAM Right 07/07/2018   Procedure: A/V FISTULAGRAM;  Surgeon: Marty Heck, MD;  Location: [facility];  Service: Cardiovascular;   Laterality: Right;  . AMPUTATION Left 03/20/2017   Procedure: LEFT ABOVE KNEE AMPUTATION;  Surgeon: Newt Minion, MD;  Location: [facility];  Service: Orthopedics;  Laterality: Left;  . APPENDECTOMY    . AV FISTULA PLACEMENT Left 09/13/2014   Procedure: Brachial Artery to Brachial Vein Gortex Four - Seven Stretch GRAFT INSERTION Left Forearm;  Surgeon: Mal Misty, MD;  Location: [facility];  Service: Vascular;  Laterality: Left;  . BASCILIC VEIN TRANSPOSITION Right 10/26/2017   Procedure: BASILIC VEIN TRANSPOSITION FIRST STAGE RIGHT ARM;  Surgeon: Conrad [place], MD;  Location: [facility];  Service: Vascular;  Laterality: Right;  . BASCILIC VEIN TRANSPOSITION Right 02/01/2018   Procedure: SECOND STAGE BASILIC VEIN TRANSPOSITION RIGHT UPPER EXTREMITY;  Surgeon: Marty Heck, MD;  Location: [facility];  Service: Vascular;  Laterality: Right;  . CHOLECYSTECTOMY OPEN    . COLON SURGERY    . EXCISIONAL TOTAL KNEE ARTHROPLASTY WITH ANTIBIOTIC SPACERS Left 02/06/2017   Procedure: Incisional total iknee with antibiotic spacer ;  Surgeon: Leandrew Koyanagi, MD;  Location: [facility];  Service: Orthopedics;  Laterality: Left;  . IR FLUORO GUIDE CV LINE RIGHT  02/10/2017  . IR REMOVAL TUN CV CATH W/O FL  04/01/2017  . IR US GUIDE VASC ACCESS RIGHT  02/10/2017  .  IRRIGATION AND DEBRIDEMENT KNEE Left 03/12/2017   Procedure: IRRIGATION AND DEBRIDEMENT LEFT KNEE WITH WOUND VAC APPLICATION;  Surgeon: Leandrew Koyanagi, MD;  Location: [facility];  Service: Orthopedics;  Laterality: Left;  . JOINT REPLACEMENT    . KNEE ARTHROSCOPY Right 08/10/2014   Procedure: ARTHROSCOPY I & D KNEE;  Surgeon: Marianna Payment, MD;  Location: [facility];  Service: Orthopedics;  Laterality: Right;  . KNEE ARTHROSCOPY Left 08/11/2014   Procedure: ARTHROSCOPIC WASHOUT LEFT KNEE;  Surgeon: Marianna Payment, MD;  Location: [facility];  Service: Orthopedics;  Laterality: Left;  . KNEE ARTHROSCOPY Left 08/19/2014   Procedure: ARTHROSCOPIC WASHOUT LEFT KNEE;  Surgeon:  Leandrew Koyanagi, MD;  Location: [facility];  Service: Orthopedics;  Laterality: Left;  . KNEE ARTHROSCOPY WITH LATERAL MENISECTOMY Left 04/04/2015   Procedure: AND PARTIAL LATERAL MENISECTOMY;  Surgeon: Leandrew Koyanagi, MD;  Location: [facility];  Service: Orthopedics;  Laterality: Left;  . KNEE ARTHROSCOPY WITH MEDIAL MENISECTOMY Left 04/04/2015   Procedure: LEFT KNEE ARTHROSCOPY WITH PARTIAL MEDIAL MENISCECTOMY  AND SYNOVECTOMY;  Surgeon: Leandrew Koyanagi, MD;  Location: [facility];  Service: Orthopedics;  Laterality: Left;  . PERIPHERAL VASCULAR BALLOON ANGIOPLASTY  07/07/2018   Procedure: PERIPHERAL VASCULAR BALLOON ANGIOPLASTY;  Surgeon: Marty Heck, MD;  Location: [facility];  Service: Cardiovascular;;  right AV fistula  . SHOULDER ARTHROSCOPY Bilateral 08/10/2014   Procedure: I & D BILATERAL SHOULDERS ;  Surgeon: Marianna Payment, MD;  Location: [facility];  Service: Orthopedics;  Laterality: Bilateral;  . SMALL INTESTINE SURGERY     Due to Small Bowel Obstruction; "fixed it when they did my gallbladder OR"  . TEE WITHOUT CARDIOVERSION N/A 08/14/2014   Procedure: TRANSESOPHAGEAL ECHOCARDIOGRAM (TEE);  Surgeon: Thayer Headings, MD;  Location: [facility];  Service: Cardiovascular;  Laterality: N/A;  . TENOSYNOVECTOMY Right 08/11/2014   Procedure: RIGHT WRIST IRRIGATION AND DEBRIDEMENT, TENOSYNOVECTOMY;  Surgeon: Marianna Payment, MD;  Location: [facility];  Service: Orthopedics;  Laterality: Right;  . TOTAL KNEE ARTHROPLASTY Left 11/07/2015   Procedure: LEFT TOTAL KNEE ARTHROPLASTY WITH REVISION OF IMPLANTS;  Surgeon: Leandrew Koyanagi, MD;  Location: [facility];  Service: Orthopedics;  Laterality: Left;  . TUBAL LIGATION      There were no vitals filed for this visit.  Subjective Assessment - 08/11/18 0949    Subjective  Walked into therapy today with rollator. Pt walked part of the way to [facility] pickup by herself; met half way by [employer] driver.    Pertinent History  left TFA,  DM, neuropathy, Hep C, HTN, CHF, depression, CKD st 5 not dialysis, arthritis, heart murmur,     Limitations  Standing;Walking;House hold activities    Patient Stated Goals  To walk with prosthesis in home & community, travel plane, car & cruise in September    Currently in Pain?  No/denies                       [facility] Adult PT Treatment/Exercise - 08/11/18 0001      Transfers   Transfers  Sit to Stand;Stand to Sit    Sit to Stand  5: Supervision;With upper extremity assist;With armrests;From chair/3-in-1    Stand to Sit  5: Supervision;With upper extremity assist;To chair/3-in-1      Ambulation/Gait   Ambulation/Gait  Yes    Ambulation/Gait Assistance  5: Supervision    Ambulation/Gait Assistance Details  training on outdoor paved surface with  inclines. Pt working on even step length and sufficient weight shifting onto prosthesis in stance phase.                                      Ambulation Distance (Feet)  200 Feet    Assistive device  Rollator;Prosthesis    Gait Pattern  Step-through pattern;Decreased step length - right;Decreased stance time - left;Decreased hip/knee flexion - left;Decreased weight shift to left;Left circumduction;Left hip hike;Antalgic;Lateral hip instability;Trunk flexed    Ambulation Surface  Outdoor;Unlevel;Indoor;Level;Paved    Stairs  Yes    Stairs Assistance  5: Supervision    Stairs Assistance Details (indicate cue type and reason)  practising with 1 rail and 1 cane    Stair Management Technique  One rail Right;Step to pattern;Forwards;With cane    Curb  5: Supervision    Curb Details (indicate cue type and reason)  with rollator      Exercises   Exercises  Knee/Hip      Knee/Hip Exercises: Aerobic   Other Aerobic  Sci fit stepper level 1.0, 8 min continuos with UEs and R LE.      Prosthetics   Prosthetic Care Comments   Pt did not wear prosthesis yesterday (dialysis) but plans to try to wear prosthesis on dialysis day tomorrow.                                   Residual limb condition   no skin issues per pt report    Education Provided  Proper Donning          Balance Exercises - 08/11/18 1007      Balance Exercises: Standing   Standing Eyes Opened  Wide (BOA);Head turns;Solid surface   + lateral hip and trunk sway +upper trunk rotations, with intermittent UE support.         PT Short Term Goals - 07/28/18 1020      PT SHORT TERM GOAL #1   Title  Patient demonstrates proper donning of prosthesis with new suction ring suspension with supervision. (All STGs Target Date: 07/30/2018)    Baseline  07/26/18:  pt is able to don on own and demonstrates knowledge to check suspension with each stand     Status  Achieved      PT SHORT TERM GOAL #2   Title  patient reports prosthesis wear daily >/= 8 hrs/day on nondialysis days and >/= 3hrs/day on dialysis days    Baseline  07/26/18: wears for 3 hours non dialysis days, non on dialysis days    Status  Not Met      PT SHORT TERM GOAL #3   Title  Patient able to reach 10" anteriorly and to floor with RW support with prosthesis safely.     Time  1    Period  Months    Status  On-going      PT SHORT TERM GOAL #4   Title  Patient ambulates 200' with RW & prosthesis with supervision.     Baseline  07/26/18: met in session today    Status  Achieved        PT Long Term Goals - 07/19/18 1800      PT LONG TERM GOAL #1   Title  Patient verbalizes & demonstrates proper prosthetic care with new prosthetic design including prosthesis use at  dialysis to enable safe use of prosthesis. (All LTGs Target Date: 09/26/2018)    Time  3    Period  Months    Status  On-going    Target Date  09/26/18      PT LONG TERM GOAL #2   Title  Patient tolerates & reports prosthesis wear daily >75% of awake hours without skin issues or limb pain.     Time  3    Period  Months    Status  On-going    Target Date  09/26/18      PT LONG TERM GOAL #3   Title  Patient ambulates 500' with one  seated rest <3 minutes outdoors with rollator walker & prosthesis modified independent for community mobility.     Time  3    Period  Months    Status  On-going    Target Date  09/26/18      PT LONG TERM GOAL #4   Title  Patient negotiates ramps, curbs with rollator walker & stairs 1 rail/cane & prosthesis modified independent to enable community mobility.     Time  3    Period  Months    Status  On-going    Target Date  09/26/18      PT LONG TERM GOAL #5   Title  Improve standing balance with tasks of Berg Balance Test with walker support >50/56    Time  3    Period  Months    Status  On-going    Target Date  09/26/18            Plan - 08/11/18 1009    Clinical Impression Statement  Skilled session focused on warming up/strengthening on Sci fit seated stepper, standing balance on solid surface, and gait on outside surface with community barriers.  Pt performed gait outdoors with rollator at supervision level and stair negotiation with 1 rail and 1 cane at supervision level.  Pt is making gradual progress with standing balance with head movement and uppr trunk rotations needing little to no UE support.                                                Rehab Potential  Good    PT Frequency  2x / week    PT Duration  12 weeks    PT Treatment/Interventions  ADLs/Self Care Home Management;Moist Heat;DME Instruction;Gait training;Stair training;Functional mobility training;Therapeutic activities;Therapeutic exercise;Balance training;Neuromuscular re-education;Patient/family education;Prosthetic Training;Manual techniques;Vestibular    PT Next Visit Plan  continue with prosthesis education, gait with RW and rollator, standing balance.       Patient will benefit from skilled therapeutic intervention in order to improve the following deficits and impairments:  Abnormal gait, Decreased activity tolerance, Decreased balance, Decreased endurance, Decreased knowledge of use of DME, Decreased  mobility, Decreased strength, Decreased range of motion, Impaired flexibility, Prosthetic Dependency, Postural dysfunction, Increased edema, Dizziness  Visit Diagnosis: Muscle weakness (generalized)  Abnormal posture  Unsteadiness on feet  Difficulty in walking, not elsewhere classified  Other abnormalities of gait and mobility     Problem List Patient Active Problem List   Diagnosis Date Noted  . Benign essential HTN   . Stage 5 chronic kidney disease not on chronic dialysis ([place])   . Above knee amputation status, left   . DM type 2 causing CKD stage 5 (  [place]) 03/21/2017  . Anemia of chronic disease 02/05/2017  . Dysplasia of cervix, high grade CIN 2 01/12/2017  . Chronic diastolic CHF (congestive heart failure) ([place]) 11/08/2015  . Total knee replacement status   . Diabetic peripheral neuropathy ([place])   . Major depression, recurrent, chronic ([place])   . Chronic kidney disease (CKD), stage IV (severe) ([place])   . Hepatitis C 08/16/2014  . Peripheral neuropathy 06/24/2013  . Compulsive tobacco user syndrome 06/24/2013    Bjorn Loser, PTA  08/11/18, 10:39 AM [facility] [address] [place], [place], [address] Phone: [phone]   Fax:  [phone]  Name: Brandy Houston MRN: [id number] Date of Birth: [birth year]

## 2018-08-12 DIAGNOSIS — N2581 Secondary hyperparathyroidism of renal origin: Secondary | ICD-10-CM | POA: Diagnosis not present

## 2018-08-12 DIAGNOSIS — Z23 Encounter for immunization: Secondary | ICD-10-CM | POA: Diagnosis not present

## 2018-08-12 DIAGNOSIS — N186 End stage renal disease: Secondary | ICD-10-CM | POA: Diagnosis not present

## 2018-08-12 DIAGNOSIS — D509 Iron deficiency anemia, unspecified: Secondary | ICD-10-CM | POA: Diagnosis not present

## 2018-08-12 DIAGNOSIS — D631 Anemia in chronic kidney disease: Secondary | ICD-10-CM | POA: Diagnosis not present

## 2018-08-12 DIAGNOSIS — E1129 Type 2 diabetes mellitus with other diabetic kidney complication: Secondary | ICD-10-CM | POA: Diagnosis not present

## 2018-08-13 ENCOUNTER — Other Ambulatory Visit (INDEPENDENT_AMBULATORY_CARE_PROVIDER_SITE_OTHER): Payer: Self-pay | Admitting: Orthopedic Surgery

## 2018-08-14 DIAGNOSIS — N2581 Secondary hyperparathyroidism of renal origin: Secondary | ICD-10-CM | POA: Diagnosis not present

## 2018-08-14 DIAGNOSIS — D509 Iron deficiency anemia, unspecified: Secondary | ICD-10-CM | POA: Diagnosis not present

## 2018-08-14 DIAGNOSIS — D631 Anemia in chronic kidney disease: Secondary | ICD-10-CM | POA: Diagnosis not present

## 2018-08-14 DIAGNOSIS — N186 End stage renal disease: Secondary | ICD-10-CM | POA: Diagnosis not present

## 2018-08-14 DIAGNOSIS — Z23 Encounter for immunization: Secondary | ICD-10-CM | POA: Diagnosis not present

## 2018-08-14 DIAGNOSIS — E1129 Type 2 diabetes mellitus with other diabetic kidney complication: Secondary | ICD-10-CM | POA: Diagnosis not present

## 2018-08-15 DIAGNOSIS — Z992 Dependence on renal dialysis: Secondary | ICD-10-CM | POA: Diagnosis not present

## 2018-08-15 DIAGNOSIS — E1129 Type 2 diabetes mellitus with other diabetic kidney complication: Secondary | ICD-10-CM | POA: Diagnosis not present

## 2018-08-15 DIAGNOSIS — N186 End stage renal disease: Secondary | ICD-10-CM | POA: Diagnosis not present

## 2018-08-16 ENCOUNTER — Ambulatory Visit: Payer: Medicare Other | Attending: Orthopedic Surgery | Admitting: Physical Therapy

## 2018-08-16 ENCOUNTER — Encounter: Payer: Self-pay | Admitting: Physical Therapy

## 2018-08-16 DIAGNOSIS — M6281 Muscle weakness (generalized): Secondary | ICD-10-CM | POA: Diagnosis not present

## 2018-08-16 DIAGNOSIS — R293 Abnormal posture: Secondary | ICD-10-CM

## 2018-08-16 DIAGNOSIS — R2681 Unsteadiness on feet: Secondary | ICD-10-CM

## 2018-08-16 DIAGNOSIS — M79605 Pain in left leg: Secondary | ICD-10-CM | POA: Insufficient documentation

## 2018-08-16 DIAGNOSIS — R2689 Other abnormalities of gait and mobility: Secondary | ICD-10-CM | POA: Insufficient documentation

## 2018-08-16 DIAGNOSIS — R262 Difficulty in walking, not elsewhere classified: Secondary | ICD-10-CM | POA: Insufficient documentation

## 2018-08-16 NOTE — Therapy (Signed)
[facility] [address] [place], [place], [address] Phone: [phone]   Fax:  [phone]  Physical Therapy Treatment  Patient Details  Name: Brandy Houston MRN: [id number] Date of Birth: [birth year] Referring Provider (PT): Meridee Score, MD   Encounter Date: 08/16/2018  PT End of Session - 08/16/18 1110    Visit Number  13    Number of Visits  24    Date for PT Re-Evaluation  09/26/18    Authorization Type  Medicare & Medicaid    PT Start Time  2774    PT Stop Time  1100    PT Time Calculation (min)  45 min    Equipment Utilized During Treatment  Gait belt    Activity Tolerance  Patient tolerated treatment well;No increased pain;Patient limited by fatigue    Behavior During Therapy  [facility] for tasks assessed/performed       Past Medical History:  Diagnosis Date  . Anemia   . Anxiety   . Arthritis    "in my joints" (03/10/2017)  . Chronic diastolic CHF (congestive heart failure) ([place])   . CKD (chronic kidney disease), stage IV ([place])    stage IV. previous HD, none currently 10/30/15 (confirmed 02/05/2017 & 03/10/2017)  . Depression    Chronic  . Diabetic peripheral neuropathy ([place])   . Dysrhythmia    tachycardia, normal ECHO 08-09-14  . GERD (gastroesophageal reflux disease)   . Heart murmur     dx'd 02/05/2017  . Hepatitis C    "tx'd in 2016; I'm negative now" (02/05/2017)  . History of blood transfusion 2017   "w/knee replacement"  . Hypertension   . Osteoarthritis of left knee   . Peripheral neuropathy   . Protein calorie malnutrition ([place])   . Septic arthritis ([place])   . Slow transit constipation   . Type II diabetes mellitus ([name])    IDDM  . Uncontrolled hypertension 02/18/2013  . Unsteady gait     Past Surgical History:  Procedure Laterality Date  . A/V FISTULAGRAM Right 07/07/2018   Procedure: A/V FISTULAGRAM;  Surgeon: Marty Heck, MD;  Location: [place];  Service: Cardiovascular;   Laterality: Right;  . AMPUTATION Left 03/20/2017   Procedure: LEFT ABOVE KNEE AMPUTATION;  Surgeon: Newt Minion, MD;  Location: [facility];  Service: Orthopedics;  Laterality: Left;  . APPENDECTOMY    . AV FISTULA PLACEMENT Left 09/13/2014   Procedure: Brachial Artery to Brachial Vein Gortex Four - Seven Stretch GRAFT INSERTION Left Forearm;  Surgeon: Mal Misty, MD;  Location: [facility];  Service: Vascular;  Laterality: Left;  . BASCILIC VEIN TRANSPOSITION Right 10/26/2017   Procedure: BASILIC VEIN TRANSPOSITION FIRST STAGE RIGHT ARM;  Surgeon: Conrad Tate, MD;  Location: [facility];  Service: Vascular;  Laterality: Right;  . BASCILIC VEIN TRANSPOSITION Right 02/01/2018   Procedure: SECOND STAGE BASILIC VEIN TRANSPOSITION RIGHT UPPER EXTREMITY;  Surgeon: Marty Heck, MD;  Location: [facility];  Service: Vascular;  Laterality: Right;  . CHOLECYSTECTOMY OPEN    . COLON SURGERY    . EXCISIONAL TOTAL KNEE ARTHROPLASTY WITH ANTIBIOTIC SPACERS Left 02/06/2017   Procedure: Incisional total iknee with antibiotic spacer ;  Surgeon: Leandrew Koyanagi, MD;  Location: [facility];  Service: Orthopedics;  Laterality: Left;  . IR FLUORO GUIDE CV LINE RIGHT  02/10/2017  . IR REMOVAL TUN CV CATH W/O FL  04/01/2017  . IR US GUIDE VASC ACCESS RIGHT  02/10/2017  .  IRRIGATION AND DEBRIDEMENT KNEE Left 03/12/2017   Procedure: IRRIGATION AND DEBRIDEMENT LEFT KNEE WITH WOUND VAC APPLICATION;  Surgeon: Leandrew Koyanagi, MD;  Location: [facility];  Service: Orthopedics;  Laterality: Left;  . JOINT REPLACEMENT    . KNEE ARTHROSCOPY Right 08/10/2014   Procedure: ARTHROSCOPY I & D KNEE;  Surgeon: Marianna Payment, MD;  Location: [facility];  Service: Orthopedics;  Laterality: Right;  . KNEE ARTHROSCOPY Left 08/11/2014   Procedure: ARTHROSCOPIC WASHOUT LEFT KNEE;  Surgeon: Marianna Payment, MD;  Location: [facility];  Service: Orthopedics;  Laterality: Left;  . KNEE ARTHROSCOPY Left 08/19/2014   Procedure: ARTHROSCOPIC WASHOUT LEFT KNEE;  Surgeon:  Leandrew Koyanagi, MD;  Location: [facility];  Service: Orthopedics;  Laterality: Left;  . KNEE ARTHROSCOPY WITH LATERAL MENISECTOMY Left 04/04/2015   Procedure: AND PARTIAL LATERAL MENISECTOMY;  Surgeon: Leandrew Koyanagi, MD;  Location: [facility];  Service: Orthopedics;  Laterality: Left;  . KNEE ARTHROSCOPY WITH MEDIAL MENISECTOMY Left 04/04/2015   Procedure: LEFT KNEE ARTHROSCOPY WITH PARTIAL MEDIAL MENISCECTOMY  AND SYNOVECTOMY;  Surgeon: Leandrew Koyanagi, MD;  Location: [facility];  Service: Orthopedics;  Laterality: Left;  . PERIPHERAL VASCULAR BALLOON ANGIOPLASTY  07/07/2018   Procedure: PERIPHERAL VASCULAR BALLOON ANGIOPLASTY;  Surgeon: Marty Heck, MD;  Location: [facility];  Service: Cardiovascular;;  right AV fistula  . SHOULDER ARTHROSCOPY Bilateral 08/10/2014   Procedure: I & D BILATERAL SHOULDERS ;  Surgeon: Marianna Payment, MD;  Location: [facility];  Service: Orthopedics;  Laterality: Bilateral;  . SMALL INTESTINE SURGERY     Due to Small Bowel Obstruction; "fixed it when they did my gallbladder OR"  . TEE WITHOUT CARDIOVERSION N/A 08/14/2014   Procedure: TRANSESOPHAGEAL ECHOCARDIOGRAM (TEE);  Surgeon: Thayer Headings, MD;  Location: [facility];  Service: Cardiovascular;  Laterality: N/A;  . TENOSYNOVECTOMY Right 08/11/2014   Procedure: RIGHT WRIST IRRIGATION AND DEBRIDEMENT, TENOSYNOVECTOMY;  Surgeon: Marianna Payment, MD;  Location: [facility];  Service: Orthopedics;  Laterality: Right;  . TOTAL KNEE ARTHROPLASTY Left 11/07/2015   Procedure: LEFT TOTAL KNEE ARTHROPLASTY WITH REVISION OF IMPLANTS;  Surgeon: Leandrew Koyanagi, MD;  Location: [facility];  Service: Orthopedics;  Laterality: Left;  . TUBAL LIGATION      There were no vitals filed for this visit.  Subjective Assessment - 08/16/18 1015    Subjective  Dialysis wears her out.   (Pended)     Pertinent History  left TFA, DM, neuropathy, Hep C, HTN, CHF, depression, CKD st 5 not dialysis, arthritis, heart  murmur,   (Pended)     Limitations  Standing;Walking;House hold activities  (Pended)     Patient Stated Goals  To walk with prosthesis in home & community, travel plane, car & cruise in September  (Pended)     Currently in Pain?  No/denies  (Pended)                        [facility] Adult PT Treatment/Exercise - 08/16/18 1015      Transfers   Transfers  Sit to Stand;Stand to Sit    Sit to Stand  5: Supervision;With upper extremity assist;With armrests;From chair/3-in-1   to locked rollator walker   Stand to Sit  5: Supervision;With upper extremity assist;To chair/3-in-1;With armrests   from locked rollator walker     Ambulation/Gait   Ambulation/Gait  Yes    Ambulation/Gait Assistance  5: Supervision    Ambulation/Gait Assistance  Details  verbal cues on upright posture / not staring at floor    Ambulation Distance (Feet)  200 Feet   200' X 2   Assistive device  Rollator;Prosthesis    Gait Pattern  Step-through pattern;Decreased step length - right;Decreased stance time - left;Decreased hip/knee flexion - left;Decreased weight shift to left;Left circumduction;Left hip hike;Antalgic;Lateral hip instability;Trunk flexed    Ambulation Surface  Indoor;Level    Stairs  --    Stairs Assistance  --    Stair Management Technique  --    Curb  --      Exercises   Exercises  --      Knee/Hip Exercises: Aerobic   Other Aerobic  --      Prosthetics   Prosthetic Care Comments   use of lotion at night to decrease dry skin, wash lotion off in morning and use of antiperspirant with prosthesis wear to decrease sweating.      Current prosthetic wear tolerance (days/week)   daily including dialysis    Current prosthetic wear tolerance (#hours/day)   8 hours /day    Residual limb condition   no skin issues per pt report    Education Provided  Proper Donning;Skin check;Residual limb care;Other (comment);Proper wear schedule/adjustment   see prosthetic care comments   Person(s) Educated   Patient    Education Method  Explanation;Demonstration;Verbal cues    Education Method  Verbalized understanding;Returned demonstration;Tactile cues required;Verbal cues required;Needs further instruction    Donning Prosthesis  Supervision    Doffing Prosthesis  Modified independent (device/increased time)             PT Education - 08/16/18 1055    Education provided  Yes    Education Details  balance HEP in corner see pt instruction    Person(s) Educated  Patient    Methods  Explanation;Demonstration;Tactile cues;Verbal cues;Handout    Comprehension  Verbalized understanding;Returned demonstration;Verbal cues required;Tactile cues required;Need further instruction       PT Short Term Goals - 08/16/18 2124      PT SHORT TERM GOAL #1   Title  Patient verbalizes proper skin care including use of lotion at night & antiperspirant with prosthesis. (All updated STGs Target Date: 08/27/2018)     Status  New    Target Date  08/27/18      PT SHORT TERM GOAL #2   Title  patient reports prosthesis wear daily >/= 10 hrs/day    Baseline         Status  Revised    Target Date  08/27/18      PT SHORT TERM GOAL #3   Title  Patient able to reach 10" anteriorly and to floor with RW support with prosthesis safely.     Time  1    Period  Months    Status  On-going    Target Date  08/27/18      PT SHORT TERM GOAL #4   Title  Patient ambulates 350' outdoors on paved surfaces with rollator walker & prosthesis with supervision.     Status  Revised    Target Date  08/27/18      PT SHORT TERM GOAL #5   Title  Patient negotiates ramps & curbs with rollator walker & prosthesis with supervision.     Status  Revised    Target Date  08/27/18        PT Long Term Goals - 08/16/18 2121      PT LONG TERM GOAL #1  Title   Patient verbalizes & demonstrates proper prosthetic care with new prosthetic design including prosthesis use at dialysis to enable safe use of prosthesis. (All LTGs Target  Date: 09/26/2018)    Time  3    Period  Months    Status  On-going    Target Date  09/26/18      PT LONG TERM GOAL #2   Title  Patient tolerates & reports prosthesis wear daily >75% of awake hours without skin issues or limb pain.     Time  3    Period  Months    Status  On-going    Target Date  09/26/18      PT LONG TERM GOAL #3   Title  Patient ambulates 500' with one seated rest <3 minutes outdoors with rollator walker & prosthesis modified independent for community mobility.     Time  3    Period  Months    Status  On-going    Target Date  09/26/18      PT LONG TERM GOAL #4   Title  Patient negotiates ramps, curbs with rollator walker & stairs 1 rail/cane & prosthesis modified independent to enable community mobility.     Time  3    Period  Months    Status  On-going    Target Date  09/26/18      PT LONG TERM GOAL #5   Title  Improve standing balance with tasks of [place] Balance Test with walker support >50/56    Time  3    Period  Months    Status  On-going    Target Date  09/26/18            Plan - 08/16/18 2128    Clinical Impression Statement  Today's skilled session focused on educating patient on proper limb care & sweat management and updated HEP for balance.     Rehab Potential  Good    PT Frequency  2x / week    PT Duration  12 weeks    PT Treatment/Interventions  ADLs/Self Care Home Management;Moist Heat;DME Instruction;Gait training;Stair training;Functional mobility training;Therapeutic activities;Therapeutic exercise;Balance training;Neuromuscular re-education;Patient/family education;Prosthetic Training;Manual techniques;Vestibular    PT Next Visit Plan  work towards updated STGs & check on HEP    Consulted and Agree with Plan of Care  Patient       Patient will benefit from skilled therapeutic intervention in order to improve the following deficits and impairments:  Abnormal gait, Decreased activity tolerance, Decreased balance, Decreased endurance,  Decreased knowledge of use of DME, Decreased mobility, Decreased strength, Decreased range of motion, Impaired flexibility, Prosthetic Dependency, Postural dysfunction, Increased edema, Dizziness  Visit Diagnosis: Muscle weakness (generalized)  Abnormal posture  Unsteadiness on feet  Other abnormalities of gait and mobility     Problem List Patient Active Problem List   Diagnosis Date Noted  . Benign essential HTN   . Stage 5 chronic kidney disease not on chronic dialysis ([place])   . Above knee amputation status, left   . DM type 2 causing CKD stage 5 ([place]) 03/21/2017  . Anemia of chronic disease 02/05/2017  . Dysplasia of cervix, high grade CIN 2 01/12/2017  . Chronic diastolic CHF (congestive heart failure) ([place]) 11/08/2015  . Total knee replacement status   . Diabetic peripheral neuropathy ([place])   . Major depression, recurrent, chronic ([place])   . Chronic kidney disease (CKD), stage IV (severe) ([place])   . Hepatitis C 08/16/2014  . Peripheral neuropathy 06/24/2013  .  Compulsive tobacco user syndrome 06/24/2013    Jamey Reas PT, DPT 08/16/2018, 9:31 PM  [facility] [address] [place], [place], [address] Phone: [phone]   Fax:  [phone]  Name: CARMELIA TINER MRN: [id number] Date of Birth: Feb 19, 1956

## 2018-08-16 NOTE — Patient Instructions (Signed)
Access Code: [email or web address]  URL: [email or web address]  Date: 08/16/2018  Prepared by: Jamey Reas   Exercises  wide stance head motions eyes open - 10 reps - 1 sets - 2 seconds hold - 1x daily - 4x weekly  Feet Apart with Eyes Closed with Head Motions - 10 reps - 1 sets - 2 seconds hold - 1x daily - 4x weekly  Wide stance on Foam Pad head movements - 10 reps - 1 sets - 2 seconds hold - 1x daily - 4x weekly

## 2018-08-17 DIAGNOSIS — N186 End stage renal disease: Secondary | ICD-10-CM | POA: Diagnosis not present

## 2018-08-17 DIAGNOSIS — D509 Iron deficiency anemia, unspecified: Secondary | ICD-10-CM | POA: Diagnosis not present

## 2018-08-17 DIAGNOSIS — E1129 Type 2 diabetes mellitus with other diabetic kidney complication: Secondary | ICD-10-CM | POA: Diagnosis not present

## 2018-08-17 DIAGNOSIS — N2581 Secondary hyperparathyroidism of renal origin: Secondary | ICD-10-CM | POA: Diagnosis not present

## 2018-08-18 ENCOUNTER — Telehealth (INDEPENDENT_AMBULATORY_CARE_PROVIDER_SITE_OTHER): Payer: Self-pay | Admitting: Orthopedic Surgery

## 2018-08-18 ENCOUNTER — Encounter: Payer: Self-pay | Admitting: Physical Therapy

## 2018-08-18 ENCOUNTER — Ambulatory Visit: Payer: Medicare Other | Admitting: Physical Therapy

## 2018-08-18 DIAGNOSIS — R2681 Unsteadiness on feet: Secondary | ICD-10-CM

## 2018-08-18 DIAGNOSIS — M6281 Muscle weakness (generalized): Secondary | ICD-10-CM

## 2018-08-18 DIAGNOSIS — M79605 Pain in left leg: Secondary | ICD-10-CM | POA: Diagnosis not present

## 2018-08-18 DIAGNOSIS — R293 Abnormal posture: Secondary | ICD-10-CM | POA: Diagnosis not present

## 2018-08-18 DIAGNOSIS — R2689 Other abnormalities of gait and mobility: Secondary | ICD-10-CM

## 2018-08-18 DIAGNOSIS — R262 Difficulty in walking, not elsewhere classified: Secondary | ICD-10-CM

## 2018-08-18 NOTE — Therapy (Signed)
[facility] [address] [place], [place], [address] Phone: [phone]   Fax:  [phone]  Physical Therapy Treatment  Patient Details  Name: Brandy Houston MRN: [id number] Date of Birth: [birth year] Referring Provider (PT): Meridee Score, MD   Encounter Date: 08/18/2018  PT End of Session - 08/18/18 1103    Visit Number  14    Number of Visits  24    Date for PT Re-Evaluation  09/26/18    Authorization Type  Medicare & Medicaid    PT Start Time  2542    PT Stop Time  1103    PT Time Calculation (min)  40 min    Equipment Utilized During Treatment  Gait belt    Activity Tolerance  Patient tolerated treatment well;No increased pain;Patient limited by fatigue    Behavior During Therapy  [facility] for tasks assessed/performed       Past Medical History:  Diagnosis Date  . Anemia   . Anxiety   . Arthritis    "in my joints" (03/10/2017)  . Chronic diastolic CHF (congestive heart failure) ([place])   . CKD (chronic kidney disease), stage IV ([place])    stage IV. previous HD, none currently 10/30/15 (confirmed 02/05/2017 & 03/10/2017)  . Depression    Chronic  . Diabetic peripheral neuropathy ([place])   . Dysrhythmia    tachycardia, normal ECHO 08-09-14  . GERD (gastroesophageal reflux disease)   . Heart murmur     dx'd 02/05/2017  . Hepatitis C    "tx'd in 2016; I'm negative now" (02/05/2017)  . History of blood transfusion 2017   "w/knee replacement"  . Hypertension   . Osteoarthritis of left knee   . Peripheral neuropathy   . Protein calorie malnutrition ([place])   . Septic arthritis ([place])   . Slow transit constipation   . Type II diabetes mellitus ([name])    IDDM  . Uncontrolled hypertension 02/18/2013  . Unsteady gait     Past Surgical History:  Procedure Laterality Date  . A/V FISTULAGRAM Right 07/07/2018   Procedure: A/V FISTULAGRAM;  Surgeon: Marty Heck, MD;  Location: [facility];  Service: Cardiovascular;   Laterality: Right;  . AMPUTATION Left 03/20/2017   Procedure: LEFT ABOVE KNEE AMPUTATION;  Surgeon: Newt Minion, MD;  Location: [facility];  Service: Orthopedics;  Laterality: Left;  . APPENDECTOMY    . AV FISTULA PLACEMENT Left 09/13/2014   Procedure: Brachial Artery to Brachial Vein Gortex Four - Seven Stretch GRAFT INSERTION Left Forearm;  Surgeon: Mal Misty, MD;  Location: [facility];  Service: Vascular;  Laterality: Left;  . BASCILIC VEIN TRANSPOSITION Right 10/26/2017   Procedure: BASILIC VEIN TRANSPOSITION FIRST STAGE RIGHT ARM;  Surgeon: Conrad Luyando, MD;  Location: [facility];  Service: Vascular;  Laterality: Right;  . BASCILIC VEIN TRANSPOSITION Right 02/01/2018   Procedure: SECOND STAGE BASILIC VEIN TRANSPOSITION RIGHT UPPER EXTREMITY;  Surgeon: Marty Heck, MD;  Location: [facility];  Service: Vascular;  Laterality: Right;  . CHOLECYSTECTOMY OPEN    . COLON SURGERY    . EXCISIONAL TOTAL KNEE ARTHROPLASTY WITH ANTIBIOTIC SPACERS Left 02/06/2017   Procedure: Incisional total iknee with antibiotic spacer ;  Surgeon: Leandrew Koyanagi, MD;  Location: [facility];  Service: Orthopedics;  Laterality: Left;  . IR FLUORO GUIDE CV LINE RIGHT  02/10/2017  . IR REMOVAL TUN CV CATH W/O FL  04/01/2017  . IR US GUIDE VASC ACCESS RIGHT  02/10/2017  .  IRRIGATION AND DEBRIDEMENT KNEE Left 03/12/2017   Procedure: IRRIGATION AND DEBRIDEMENT LEFT KNEE WITH WOUND VAC APPLICATION;  Surgeon: Leandrew Koyanagi, MD;  Location: [facility];  Service: Orthopedics;  Laterality: Left;  . JOINT REPLACEMENT    . KNEE ARTHROSCOPY Right 08/10/2014   Procedure: ARTHROSCOPY I & D KNEE;  Surgeon: Marianna Payment, MD;  Location: [facility];  Service: Orthopedics;  Laterality: Right;  . KNEE ARTHROSCOPY Left 08/11/2014   Procedure: ARTHROSCOPIC WASHOUT LEFT KNEE;  Surgeon: Marianna Payment, MD;  Location: [facility];  Service: Orthopedics;  Laterality: Left;  . KNEE ARTHROSCOPY Left 08/19/2014   Procedure: ARTHROSCOPIC WASHOUT LEFT KNEE;  Surgeon:  Leandrew Koyanagi, MD;  Location: [facility];  Service: Orthopedics;  Laterality: Left;  . KNEE ARTHROSCOPY WITH LATERAL MENISECTOMY Left 04/04/2015   Procedure: AND PARTIAL LATERAL MENISECTOMY;  Surgeon: Leandrew Koyanagi, MD;  Location: [facility];  Service: Orthopedics;  Laterality: Left;  . KNEE ARTHROSCOPY WITH MEDIAL MENISECTOMY Left 04/04/2015   Procedure: LEFT KNEE ARTHROSCOPY WITH PARTIAL MEDIAL MENISCECTOMY  AND SYNOVECTOMY;  Surgeon: Leandrew Koyanagi, MD;  Location: [facility];  Service: Orthopedics;  Laterality: Left;  . PERIPHERAL VASCULAR BALLOON ANGIOPLASTY  07/07/2018   Procedure: PERIPHERAL VASCULAR BALLOON ANGIOPLASTY;  Surgeon: Marty Heck, MD;  Location: [facility];  Service: Cardiovascular;;  right AV fistula  . SHOULDER ARTHROSCOPY Bilateral 08/10/2014   Procedure: I & D BILATERAL SHOULDERS ;  Surgeon: Marianna Payment, MD;  Location: [facility];  Service: Orthopedics;  Laterality: Bilateral;  . SMALL INTESTINE SURGERY     Due to Small Bowel Obstruction; "fixed it when they did my gallbladder OR"  . TEE WITHOUT CARDIOVERSION N/A 08/14/2014   Procedure: TRANSESOPHAGEAL ECHOCARDIOGRAM (TEE);  Surgeon: Thayer Headings, MD;  Location: [facility];  Service: Cardiovascular;  Laterality: N/A;  . TENOSYNOVECTOMY Right 08/11/2014   Procedure: RIGHT WRIST IRRIGATION AND DEBRIDEMENT, TENOSYNOVECTOMY;  Surgeon: Marianna Payment, MD;  Location: [facility];  Service: Orthopedics;  Laterality: Right;  . TOTAL KNEE ARTHROPLASTY Left 11/07/2015   Procedure: LEFT TOTAL KNEE ARTHROPLASTY WITH REVISION OF IMPLANTS;  Surgeon: Leandrew Koyanagi, MD;  Location: [facility];  Service: Orthopedics;  Laterality: Left;  . TUBAL LIGATION      There were no vitals filed for this visit.  Subjective Assessment - 08/18/18 1030    Subjective  Pt wore prosthesis during dialysis and residual limb is sore.  Reports having phantom pain and muscle spasms at night but currently not in pain.     Pertinent History  left TFA, DM, neuropathy, Hep C, HTN, CHF, depression, CKD st 5 not dialysis, arthritis, heart murmur,     Limitations  Standing;Walking;House hold activities    Patient Stated Goals  To walk with prosthesis in home & community, travel plane, car & cruise in September    Currently in Pain?  Yes    Pain Score  5     Pain Location  Back    Pain Orientation  Mid    Pain Descriptors / Indicators  Aching    Pain Onset  1 to 4 weeks ago    Pain Frequency  Intermittent    Pain Relieving Factors  medication                       [facility] Adult PT Treatment/Exercise - 08/18/18 0001      Knee/Hip Exercises: Aerobic   Other Aerobic  Sci fit  stepper level 2.0, 8 min continuous with UEs and R LE. Warmup for back.     Prosthetics   Prosthetic Care Comments   Reviewed: use of lotion at night to decrease dry skin, wash lotion off in morning and use of antiperspirant with prosthesis wear to decrease sweating.    Current prosthetic wear tolerance (days/week)   daily including dialysis    Current prosthetic wear tolerance (#hours/day)   8 hours /day    Residual limb condition   no skin issues per pt report    Education Provided  Skin check;Residual limb care    Person(s) Educated  Patient    Education Method  Explanation;Demonstration    Education Method  Verbalized understanding          Balance Exercises - 08/18/18 1258      Balance Exercises: Standing   Standing Eyes Opened  Wide ([place]);Head turns;Solid surface;Foam/compliant surface    Standing Eyes Closed  Wide ([facility]);Head turns        PT Education - 08/18/18 1052    Education provided  Yes    Education Details  Reviewed/ performed HEP for corner balance exercises.    Person(s) Educated  Patient    Methods  Explanation;Demonstration;Verbal cues;Handout    Comprehension  Verbalized understanding;Returned demonstration;Verbal cues required;Need further instruction       PT Short Term Goals - 08/16/18  2124      PT SHORT TERM GOAL #1   Title  Patient verbalizes proper skin care including use of lotion at night & antiperspirant with prosthesis. (All updated STGs Target Date: 08/27/2018)     Status  New    Target Date  08/27/18      PT SHORT TERM GOAL #2   Title  patient reports prosthesis wear daily >/= 10 hrs/day    Baseline         Status  Revised    Target Date  08/27/18      PT SHORT TERM GOAL #3   Title  Patient able to reach 10" anteriorly and to floor with RW support with prosthesis safely.     Time  1    Period  Months    Status  On-going    Target Date  08/27/18      PT SHORT TERM GOAL #4   Title  Patient ambulates 350' outdoors on paved surfaces with rollator walker & prosthesis with supervision.     Status  Revised    Target Date  08/27/18      PT SHORT TERM GOAL #5   Title  Patient negotiates ramps & curbs with rollator walker & prosthesis with supervision.     Status  Revised    Target Date  08/27/18        PT Long Term Goals - 08/16/18 2121      PT LONG TERM GOAL #1   Title   Patient verbalizes & demonstrates proper prosthetic care with new prosthetic design including prosthesis use at dialysis to enable safe use of prosthesis. (All LTGs Target Date: 09/26/2018)    Time  3    Period  Months    Status  On-going    Target Date  09/26/18      PT LONG TERM GOAL #2   Title  Patient tolerates & reports prosthesis wear daily >75% of awake hours without skin issues or limb pain.     Time  3    Period  Months    Status  On-going  Target Date  09/26/18      PT LONG TERM GOAL #3   Title  Patient ambulates 500' with one seated rest <3 minutes outdoors with rollator walker & prosthesis modified independent for community mobility.     Time  3    Period  Months    Status  On-going    Target Date  09/26/18      PT LONG TERM GOAL #4   Title  Patient negotiates ramps, curbs with rollator walker & stairs 1 rail/cane & prosthesis modified independent to enable  community mobility.     Time  3    Period  Months    Status  On-going    Target Date  09/26/18      PT LONG TERM GOAL #5   Title  Improve standing balance with tasks of Berg Balance Test with walker support >50/56    Time  3    Period  Months    Status  On-going    Target Date  09/26/18            Plan - 08/18/18 1035    Clinical Impression Statement  Skilled session focused on warming up on Sci fit seated stepper, gait training, reviewing residual limb care, and standing balance training with head movments.  Pt is making progress with increased wear time of prosthesis and increased functional walking distances.  Standing balance training: pt required min cues for technique and safety for corner balance HEP; pt required min A to supervision level with intermittent UE support.                                                                                                  Rehab Potential  Good    PT Frequency  2x / week    PT Duration  12 weeks    PT Treatment/Interventions  ADLs/Self Care Home Management;Moist Heat;DME Instruction;Gait training;Stair training;Functional mobility training;Therapeutic activities;Therapeutic exercise;Balance training;Neuromuscular re-education;Patient/family education;Prosthetic Training;Manual techniques;Vestibular    PT Next Visit Plan  work towards updated STGs progress standing balance training.    Consulted and Agree with Plan of Care  Patient       Patient will benefit from skilled therapeutic intervention in order to improve the following deficits and impairments:  Abnormal gait, Decreased activity tolerance, Decreased balance, Decreased endurance, Decreased knowledge of use of DME, Decreased mobility, Decreased strength, Decreased range of motion, Impaired flexibility, Prosthetic Dependency, Postural dysfunction, Increased edema, Dizziness  Visit Diagnosis: Muscle weakness (generalized)  Abnormal posture  Unsteadiness on feet  Other  abnormalities of gait and mobility  Difficulty in walking, not elsewhere classified  Pain in left leg     Problem List Patient Active Problem List   Diagnosis Date Noted  . Benign essential HTN   . Stage 5 chronic kidney disease not on chronic dialysis ([place])   . Above knee amputation status, left   . DM type 2 causing CKD stage 5 ([place]) 03/21/2017  . Anemia of chronic disease 02/05/2017  . Dysplasia of cervix, high grade CIN 2 01/12/2017  . Chronic diastolic CHF (congestive heart failure) (  [place]) 11/08/2015  . Total knee replacement status   . Diabetic peripheral neuropathy ([place])   . Major depression, recurrent, chronic ([place])   . Chronic kidney disease (CKD), stage IV (severe) ([place])   . Hepatitis C 08/16/2014  . Peripheral neuropathy 06/24/2013  . Compulsive tobacco user syndrome 06/24/2013    Bjorn Loser, PTA  08/18/18, 1:07 PM [facility] [address] [place], [place], [address] Phone: [phone]   Fax:  [phone]  Name: Brandy Houston MRN: [id number] Date of Birth: [birth year]

## 2018-08-18 NOTE — Telephone Encounter (Signed)
I called and lm on vm for pt to advise that we have not seen her in the office since October. If she is continuing to have issues we are happy to see her in the office but this is not something that we can continue to write without evaluation. Pt to call with any questions and we are happy to see her at any time.

## 2018-08-18 NOTE — Telephone Encounter (Signed)
Patient request a call back regarding the denial of the  muscle relaxer medication (could not give the name). Patient's call back # [phone]

## 2018-08-18 NOTE — Patient Instructions (Signed)
Access Code: [email or web address]  URL: [email or web address]  Date: 08/18/2018  Prepared by: Bjorn Loser   Exercises  wide stance head motions eyes open - 10 reps - 1 sets - 2 seconds hold - 1x daily - 4x weekly  Feet Apart with Eyes Closed with Head Motions - 10 reps - 1 sets - 2 seconds hold - 1x daily - 4x weekly  Wide stance on Foam Pad head movements - 10 reps - 1 sets - 2 seconds hold - 1x daily - 4x weekly

## 2018-08-19 DIAGNOSIS — N186 End stage renal disease: Secondary | ICD-10-CM | POA: Diagnosis not present

## 2018-08-19 DIAGNOSIS — N2581 Secondary hyperparathyroidism of renal origin: Secondary | ICD-10-CM | POA: Diagnosis not present

## 2018-08-19 DIAGNOSIS — D509 Iron deficiency anemia, unspecified: Secondary | ICD-10-CM | POA: Diagnosis not present

## 2018-08-19 DIAGNOSIS — E1129 Type 2 diabetes mellitus with other diabetic kidney complication: Secondary | ICD-10-CM | POA: Diagnosis not present

## 2018-08-21 DIAGNOSIS — D509 Iron deficiency anemia, unspecified: Secondary | ICD-10-CM | POA: Diagnosis not present

## 2018-08-21 DIAGNOSIS — N2581 Secondary hyperparathyroidism of renal origin: Secondary | ICD-10-CM | POA: Diagnosis not present

## 2018-08-21 DIAGNOSIS — E1129 Type 2 diabetes mellitus with other diabetic kidney complication: Secondary | ICD-10-CM | POA: Diagnosis not present

## 2018-08-21 DIAGNOSIS — N186 End stage renal disease: Secondary | ICD-10-CM | POA: Diagnosis not present

## 2018-08-23 ENCOUNTER — Ambulatory Visit: Payer: Medicare Other | Admitting: Physical Therapy

## 2018-08-23 ENCOUNTER — Encounter: Payer: Self-pay | Admitting: Physical Therapy

## 2018-08-23 DIAGNOSIS — R2681 Unsteadiness on feet: Secondary | ICD-10-CM | POA: Diagnosis not present

## 2018-08-23 DIAGNOSIS — M6281 Muscle weakness (generalized): Secondary | ICD-10-CM

## 2018-08-23 DIAGNOSIS — R262 Difficulty in walking, not elsewhere classified: Secondary | ICD-10-CM

## 2018-08-23 DIAGNOSIS — M79605 Pain in left leg: Secondary | ICD-10-CM | POA: Diagnosis not present

## 2018-08-23 DIAGNOSIS — R293 Abnormal posture: Secondary | ICD-10-CM | POA: Diagnosis not present

## 2018-08-23 DIAGNOSIS — R2689 Other abnormalities of gait and mobility: Secondary | ICD-10-CM | POA: Diagnosis not present

## 2018-08-23 NOTE — Therapy (Signed)
[facility] [address] [place], [place], [address] Phone: [phone]   Fax:  [phone]  Physical Therapy Treatment  Patient Details  Name: Brandy Houston MRN: [id number] Date of Birth: 02-23-1956 Referring Provider (PT): Meridee Score, MD   Encounter Date: 08/23/2018  PT End of Session - 08/23/18 1042    Visit Number  15    Number of Visits  24    Date for PT Re-Evaluation  09/26/18    Authorization Type  Medicare & Medicaid    PT Start Time  0933    PT Stop Time  1020    PT Time Calculation (min)  47 min    Equipment Utilized During Treatment  Gait belt    Activity Tolerance  Patient tolerated treatment well;No increased pain;Patient limited by fatigue    Behavior During Therapy  [facility] for tasks assessed/performed       Past Medical History:  Diagnosis Date  . Anemia   . Anxiety   . Arthritis    "in my joints" (03/10/2017)  . Chronic diastolic CHF (congestive heart failure) ([place])   . CKD (chronic kidney disease), stage IV ([place])    stage IV. previous HD, none currently 10/30/15 (confirmed 02/05/2017 & 03/10/2017)  . Depression    Chronic  . Diabetic peripheral neuropathy ([place])   . Dysrhythmia    tachycardia, normal ECHO 08-09-14  . GERD (gastroesophageal reflux disease)   . Heart murmur     dx'd 02/05/2017  . Hepatitis C    "tx'd in 2016; I'm negative now" (02/05/2017)  . History of blood transfusion 2017   "w/knee replacement"  . Hypertension   . Osteoarthritis of left knee   . Peripheral neuropathy   . Protein calorie malnutrition ([place])   . Septic arthritis ([place])   . Slow transit constipation   . Type II diabetes mellitus ([name])    IDDM  . Uncontrolled hypertension 02/18/2013  . Unsteady gait     Past Surgical History:  Procedure Laterality Date  . A/V FISTULAGRAM Right 07/07/2018   Procedure: A/V FISTULAGRAM;  Surgeon: Marty Heck, MD;  Location: [facility];  Service: Cardiovascular;   Laterality: Right;  . AMPUTATION Left 03/20/2017   Procedure: LEFT ABOVE KNEE AMPUTATION;  Surgeon: Newt Minion, MD;  Location: [facility];  Service: Orthopedics;  Laterality: Left;  . APPENDECTOMY    . AV FISTULA PLACEMENT Left 09/13/2014   Procedure: Brachial Artery to Brachial Vein Gortex Four - Seven Stretch GRAFT INSERTION Left Forearm;  Surgeon: Mal Misty, MD;  Location: [facility];  Service: Vascular;  Laterality: Left;  . BASCILIC VEIN TRANSPOSITION Right 10/26/2017   Procedure: BASILIC VEIN TRANSPOSITION FIRST STAGE RIGHT ARM;  Surgeon: Conrad Deming, MD;  Location: [facility];  Service: Vascular;  Laterality: Right;  . BASCILIC VEIN TRANSPOSITION Right 02/01/2018   Procedure: SECOND STAGE BASILIC VEIN TRANSPOSITION RIGHT UPPER EXTREMITY;  Surgeon: Marty Heck, MD;  Location: [facility];  Service: Vascular;  Laterality: Right;  . CHOLECYSTECTOMY OPEN    . COLON SURGERY    . EXCISIONAL TOTAL KNEE ARTHROPLASTY WITH ANTIBIOTIC SPACERS Left 02/06/2017   Procedure: Incisional total iknee with antibiotic spacer ;  Surgeon: Leandrew Koyanagi, MD;  Location: [facility];  Service: Orthopedics;  Laterality: Left;  . IR FLUORO GUIDE CV LINE RIGHT  02/10/2017  . IR REMOVAL TUN CV CATH W/O FL  04/01/2017  . IR US GUIDE VASC ACCESS RIGHT  02/10/2017  .  IRRIGATION AND DEBRIDEMENT KNEE Left 03/12/2017   Procedure: IRRIGATION AND DEBRIDEMENT LEFT KNEE WITH WOUND VAC APPLICATION;  Surgeon: Leandrew Koyanagi, MD;  Location: [facility];  Service: Orthopedics;  Laterality: Left;  . JOINT REPLACEMENT    . KNEE ARTHROSCOPY Right 08/10/2014   Procedure: ARTHROSCOPY I & D KNEE;  Surgeon: Marianna Payment, MD;  Location: [facility];  Service: Orthopedics;  Laterality: Right;  . KNEE ARTHROSCOPY Left 08/11/2014   Procedure: ARTHROSCOPIC WASHOUT LEFT KNEE;  Surgeon: Marianna Payment, MD;  Location: [facility];  Service: Orthopedics;  Laterality: Left;  . KNEE ARTHROSCOPY Left 08/19/2014   Procedure: ARTHROSCOPIC WASHOUT LEFT KNEE;  Surgeon:  Leandrew Koyanagi, MD;  Location: [facility];  Service: Orthopedics;  Laterality: Left;  . KNEE ARTHROSCOPY WITH LATERAL MENISECTOMY Left 04/04/2015   Procedure: AND PARTIAL LATERAL MENISECTOMY;  Surgeon: Leandrew Koyanagi, MD;  Location: [place] [facility];  Service: Orthopedics;  Laterality: Left;  . KNEE ARTHROSCOPY WITH MEDIAL MENISECTOMY Left 04/04/2015   Procedure: LEFT KNEE ARTHROSCOPY WITH PARTIAL MEDIAL MENISCECTOMY  AND SYNOVECTOMY;  Surgeon: Leandrew Koyanagi, MD;  Location: [facility];  Service: Orthopedics;  Laterality: Left;  . PERIPHERAL VASCULAR BALLOON ANGIOPLASTY  07/07/2018   Procedure: PERIPHERAL VASCULAR BALLOON ANGIOPLASTY;  Surgeon: Marty Heck, MD;  Location: [facility];  Service: Cardiovascular;;  right AV fistula  . SHOULDER ARTHROSCOPY Bilateral 08/10/2014   Procedure: I & D BILATERAL SHOULDERS ;  Surgeon: Marianna Payment, MD;  Location: [facility];  Service: Orthopedics;  Laterality: Bilateral;  . SMALL INTESTINE SURGERY     Due to Small Bowel Obstruction; "fixed it when they did my gallbladder OR"  . TEE WITHOUT CARDIOVERSION N/A 08/14/2014   Procedure: TRANSESOPHAGEAL ECHOCARDIOGRAM (TEE);  Surgeon: Thayer Headings, MD;  Location: [facility];  Service: Cardiovascular;  Laterality: N/A;  . TENOSYNOVECTOMY Right 08/11/2014   Procedure: RIGHT WRIST IRRIGATION AND DEBRIDEMENT, TENOSYNOVECTOMY;  Surgeon: Marianna Payment, MD;  Location: [facility];  Service: Orthopedics;  Laterality: Right;  . TOTAL KNEE ARTHROPLASTY Left 11/07/2015   Procedure: LEFT TOTAL KNEE ARTHROPLASTY WITH REVISION OF IMPLANTS;  Surgeon: Leandrew Koyanagi, MD;  Location: [facility];  Service: Orthopedics;  Laterality: Left;  . TUBAL LIGATION      There were no vitals filed for this visit.  Subjective Assessment - 08/23/18 0942    Subjective  Pt presents to PT today with socket pistoning. She reports she has been wearing leg every day except Sunday because it is sore. HEP has been going well but  is challenging.     Pertinent History  left TFA, DM, neuropathy, Hep C, HTN, CHF, depression, CKD st 5 not dialysis, arthritis, heart murmur,     Limitations  Standing;Walking;House hold activities    Patient Stated Goals  To walk with prosthesis in home & community, travel plane, car & cruise in September    Currently in Pain?  No/denies    Pain Onset  1 to 4 weeks ago                       [facility] Adult PT Treatment/Exercise - 08/23/18 0945      Ambulation/Gait   Ramp  5: Supervision    Ramp Details (indicate cue type and reason)  with rollator and prosthesis    Curb  5: Supervision    Curb Details (indicate cue type and reason)  with rollator and prosthesis  Neuro Re-ed    Neuro Re-ed Details   bilateral isolated shoulder rows, punches, and bicep curls with red therband in parallel bars; pt demonstrates slight postural sway with no overt LOB      Prosthetics   Prosthetic Care Comments   Prosthesis pistoning due to poor suspension when pt presented to PT. Reviewed importance of liner being in contact with skin in order to acheive proper suspension when donning prostheisis. Educated patient on keeping foot planted on floor when releasing air from socket.     Current prosthetic wear tolerance (days/week)   6 days/week   has not been wearing sunday since leg is sore   Current prosthetic wear tolerance (#hours/day)   8 hours /day    Residual limb condition   no skin issues per pt report    Education Provided  Proper Donning;Proper wear schedule/adjustment    Person(s) Educated  Patient    Education Method  Demonstration;Explanation    Education Method  Verbalized understanding;Returned demonstration    Donning Prosthesis  Supervision    Doffing Prosthesis  Modified independent (device/increased time)          Balance Exercises - 08/23/18 1027      Balance Exercises: Standing   Standing Eyes Opened  Wide ([place]);Head turns;Solid surface;5 reps;Foam/compliant  surface;Limitations   horizontal, vertical, diagonal turns in  bars   Standing Eyes Closed  Wide ([place]);Solid surface;Limitations;Head turns;5 reps;10 secs;Foam/compliant surface   head turns - solid ground; static - foam, in  bars     Balance Exercises: Standing   Standing Eyes Opened Limitations  frequent LOB performing head turns with eyes open while standing; required light hand contact with parallel bars and contact guard to min assist to correct    Standing Eyes Closed Limitations  demonstrated increased postural sway with eyes closed on foam; unable to hold for 10 seconds; required frequent hand contact with parallel bars and contact guard to min assist to correct LOB         PT Education - 08/23/18 0950    Education provided  Yes    Education Details  discussed gym membership options; importance of regular exercise to maintain strength and energy levels given dialysis; see prosthetic care comments     Person(s) Educated  Patient    Methods  Explanation    Comprehension  Verbalized understanding       PT Short Term Goals - 08/16/18 2124      PT SHORT TERM GOAL #1   Title  Patient verbalizes proper skin care including use of lotion at night & antiperspirant with prosthesis. (All updated STGs Target Date: 08/27/2018)     Status  New    Target Date  08/27/18      PT SHORT TERM GOAL #2   Title  patient reports prosthesis wear daily >/= 10 hrs/day    Baseline         Status  Revised    Target Date  08/27/18      PT SHORT TERM GOAL #3   Title  Patient able to reach 10" anteriorly and to floor with RW support with prosthesis safely.     Time  1    Period  Months    Status  On-going    Target Date  08/27/18      PT SHORT TERM GOAL #4   Title  Patient ambulates 350' outdoors on paved surfaces with rollator walker & prosthesis with supervision.     Status  Revised  Target Date  08/27/18      PT SHORT TERM GOAL #5   Title  Patient negotiates ramps & curbs with rollator  walker & prosthesis with supervision.     Status  Revised    Target Date  08/27/18        PT Long Term Goals - 08/16/18 2121      PT LONG TERM GOAL #1   Title   Patient verbalizes & demonstrates proper prosthetic care with new prosthetic design including prosthesis use at dialysis to enable safe use of prosthesis. (All LTGs Target Date: 09/26/2018)    Time  3    Period  Months    Status  On-going    Target Date  09/26/18      PT LONG TERM GOAL #2   Title  Patient tolerates & reports prosthesis wear daily >75% of awake hours without skin issues or limb pain.     Time  3    Period  Months    Status  On-going    Target Date  09/26/18      PT LONG TERM GOAL #3   Title  Patient ambulates 500' with one seated rest <3 minutes outdoors with rollator walker & prosthesis modified independent for community mobility.     Time  3    Period  Months    Status  On-going    Target Date  09/26/18      PT LONG TERM GOAL #4   Title  Patient negotiates ramps, curbs with rollator walker & stairs 1 rail/cane & prosthesis modified independent to enable community mobility.     Time  3    Period  Months    Status  On-going    Target Date  09/26/18      PT LONG TERM GOAL #5   Title  Improve standing balance with tasks of [place] Balance Test with walker support >50/56    Time  3    Period  Months    Status  On-going    Target Date  09/26/18            Plan - 08/23/18 1042    Clinical Impression Statement  Treatment session focused on prosthetic training, reviewing proper fit of prosthesis, navigating ramps and curbs, and standing balance training with head movements. Pt is making progress with increase wear time of prosthesis and was educated to wear 7 days/week if residual limb isn't sore. Pt educated on importance of exercsing outside therapy sessions. Pt demonstrates continued balance deficits as noted by increased postural sway and frequnet LOB during static and dynamic balance activities.  Demonstrates improved safety and stability while navigating ramps and curbs with prosthesis and rollator. Pt will benefit from continued  OPPT to address balance deficits and improve funcitonal independence with mobility.     Rehab Potential  Good    PT Frequency  2x / week    PT Duration  12 weeks    PT Treatment/Interventions  ADLs/Self Care Home Management;Moist Heat;DME Instruction;Gait training;Stair training;Functional mobility training;Therapeutic activities;Therapeutic exercise;Balance training;Neuromuscular re-education;Patient/family education;Prosthetic Training;Manual techniques;Vestibular    PT Next Visit Plan  check updated STGS    Consulted and Agree with Plan of Care  Patient       Patient will benefit from skilled therapeutic intervention in order to improve the following deficits and impairments:  Abnormal gait, Decreased activity tolerance, Decreased balance, Decreased endurance, Decreased knowledge of use of DME, Decreased mobility, Decreased strength, Decreased range of motion, Impaired flexibility,  Prosthetic Dependency, Postural dysfunction, Increased edema, Dizziness  Visit Diagnosis: Other abnormalities of gait and mobility  Muscle weakness (generalized)  Abnormal posture  Unsteadiness on feet  Difficulty in walking, not elsewhere classified     Problem List Patient Active Problem List   Diagnosis Date Noted  . Benign essential HTN   . Stage 5 chronic kidney disease not on chronic dialysis ([place])   . Above knee amputation status, left   . DM type 2 causing CKD stage 5 ([place]) 03/21/2017  . Anemia of chronic disease 02/05/2017  . Dysplasia of cervix, high grade CIN 2 01/12/2017  . Chronic diastolic CHF (congestive heart failure) ([place]) 11/08/2015  . Total knee replacement status   . Diabetic peripheral neuropathy ([place])   . Major depression, recurrent, chronic ([place])   . Chronic kidney disease (CKD), stage IV (severe) ([place])   . Hepatitis C 08/16/2014  .  Peripheral neuropathy 06/24/2013  . Compulsive tobacco user syndrome 06/24/2013   Joaquin Courts, SPT 08/23/2018, 10:51 AM  Jamey Reas, PT, DPT 08/23/2018, 11:40 AM  [facility] [address] [place], [place], [address] Phone: [phone]   Fax:  [phone]  Name: DINA WARBINGTON MRN: [id number] Date of Birth: [birth year]

## 2018-08-24 DIAGNOSIS — N2581 Secondary hyperparathyroidism of renal origin: Secondary | ICD-10-CM | POA: Diagnosis not present

## 2018-08-24 DIAGNOSIS — D509 Iron deficiency anemia, unspecified: Secondary | ICD-10-CM | POA: Diagnosis not present

## 2018-08-24 DIAGNOSIS — E1129 Type 2 diabetes mellitus with other diabetic kidney complication: Secondary | ICD-10-CM | POA: Diagnosis not present

## 2018-08-24 DIAGNOSIS — N186 End stage renal disease: Secondary | ICD-10-CM | POA: Diagnosis not present

## 2018-08-25 ENCOUNTER — Ambulatory Visit: Payer: Medicare Other | Admitting: Physical Therapy

## 2018-08-25 ENCOUNTER — Encounter: Payer: Self-pay | Admitting: Physical Therapy

## 2018-08-25 DIAGNOSIS — R293 Abnormal posture: Secondary | ICD-10-CM

## 2018-08-25 DIAGNOSIS — M79605 Pain in left leg: Secondary | ICD-10-CM | POA: Diagnosis not present

## 2018-08-25 DIAGNOSIS — R2689 Other abnormalities of gait and mobility: Secondary | ICD-10-CM

## 2018-08-25 DIAGNOSIS — M6281 Muscle weakness (generalized): Secondary | ICD-10-CM

## 2018-08-25 DIAGNOSIS — R262 Difficulty in walking, not elsewhere classified: Secondary | ICD-10-CM | POA: Diagnosis not present

## 2018-08-25 DIAGNOSIS — R2681 Unsteadiness on feet: Secondary | ICD-10-CM | POA: Diagnosis not present

## 2018-08-26 DIAGNOSIS — N2581 Secondary hyperparathyroidism of renal origin: Secondary | ICD-10-CM | POA: Diagnosis not present

## 2018-08-26 DIAGNOSIS — N186 End stage renal disease: Secondary | ICD-10-CM | POA: Diagnosis not present

## 2018-08-26 DIAGNOSIS — E1129 Type 2 diabetes mellitus with other diabetic kidney complication: Secondary | ICD-10-CM | POA: Diagnosis not present

## 2018-08-26 DIAGNOSIS — D509 Iron deficiency anemia, unspecified: Secondary | ICD-10-CM | POA: Diagnosis not present

## 2018-08-26 NOTE — Therapy (Signed)
[facility] [address] [place], [place], [address] Phone: [phone]   Fax:  [phone]  Physical Therapy Treatment  Patient Details  Name: Brandy Houston MRN: [id number] Date of Birth: 1956/06/01 Referring Provider (PT): Meridee Score, MD   Encounter Date: 08/25/2018     08/25/18 1025  PT Visits / Re-Eval  Visit Number 16  Number of Visits 24  Date for PT Re-Evaluation 09/26/18  Authorization  Authorization Type Medicare & Medicaid  PT Time Calculation  PT Start Time 1019  PT Stop Time 1100  PT Time Calculation (min) 41 min  PT - End of Session  Equipment Utilized During Treatment Gait belt  Activity Tolerance Patient tolerated treatment well;No increased pain;Patient limited by fatigue  Behavior During Therapy Brandy Houston for tasks assessed/performed    Past Medical History:  Diagnosis Date  . Anemia   . Anxiety   . Arthritis    "in my joints" (03/10/2017)  . Chronic diastolic CHF (congestive heart failure) ([place])   . CKD (chronic kidney disease), stage IV ([place])    stage IV. previous HD, none currently 10/30/15 (confirmed 02/05/2017 & 03/10/2017)  . Depression    Chronic  . Diabetic peripheral neuropathy ([place])   . Dysrhythmia    tachycardia, normal ECHO 08-09-14  . GERD (gastroesophageal reflux disease)   . Heart murmur     dx'd 02/05/2017  . Hepatitis C    "tx'd in 2016; I'm negative now" (02/05/2017)  . History of blood transfusion 2017   "w/knee replacement"  . Hypertension   . Osteoarthritis of left knee   . Peripheral neuropathy   . Protein calorie malnutrition ([place])   . Septic arthritis ([place])   . Slow transit constipation   . Type II diabetes mellitus ([name])    IDDM  . Uncontrolled hypertension 02/18/2013  . Unsteady gait     Past Surgical History:  Procedure Laterality Date  . A/V FISTULAGRAM Right 07/07/2018   Procedure: A/V FISTULAGRAM;  Surgeon: Marty Heck, MD;  Location: [facility];  Service: Cardiovascular;  Laterality: Right;  . AMPUTATION Left 03/20/2017   Procedure: LEFT ABOVE KNEE AMPUTATION;  Surgeon: Newt Minion, MD;  Location: [facility];  Service: Orthopedics;  Laterality: Left;  . APPENDECTOMY    . AV FISTULA PLACEMENT Left 09/13/2014   Procedure: Brachial Artery to Brachial Vein Gortex Four - Seven Stretch GRAFT INSERTION Left Forearm;  Surgeon: Mal Misty, MD;  Location: [facility];  Service: Vascular;  Laterality: Left;  . BASCILIC VEIN TRANSPOSITION Right 10/26/2017   Procedure: BASILIC VEIN TRANSPOSITION FIRST STAGE RIGHT ARM;  Surgeon: Conrad Madison Park, MD;  Location: [facility];  Service: Vascular;  Laterality: Right;  . BASCILIC VEIN TRANSPOSITION Right 02/01/2018   Procedure: SECOND STAGE BASILIC VEIN TRANSPOSITION RIGHT UPPER EXTREMITY;  Surgeon: Marty Heck, MD;  Location: [facility];  Service: Vascular;  Laterality: Right;  . CHOLECYSTECTOMY OPEN    . COLON SURGERY    . EXCISIONAL TOTAL KNEE ARTHROPLASTY WITH ANTIBIOTIC SPACERS Left 02/06/2017   Procedure: Incisional total iknee with antibiotic spacer ;  Surgeon: Leandrew Koyanagi, MD;  Location: [facility];  Service: Orthopedics;  Laterality: Left;  . IR FLUORO GUIDE CV LINE RIGHT  02/10/2017  . IR REMOVAL TUN CV CATH W/O FL  04/01/2017  . IR US GUIDE VASC ACCESS RIGHT  02/10/2017  . IRRIGATION AND DEBRIDEMENT KNEE Left 03/12/2017   Procedure: IRRIGATION AND DEBRIDEMENT LEFT KNEE WITH WOUND VAC APPLICATION;  Surgeon: Leandrew Koyanagi, MD;  Location: [facility];  Service: Orthopedics;  Laterality: Left;  . JOINT REPLACEMENT    . KNEE ARTHROSCOPY Right 08/10/2014   Procedure: ARTHROSCOPY I & D KNEE;  Surgeon: Marianna Payment, MD;  Location: [facility];  Service: Orthopedics;  Laterality: Right;  . KNEE ARTHROSCOPY Left 08/11/2014   Procedure: ARTHROSCOPIC WASHOUT LEFT KNEE;  Surgeon: Marianna Payment, MD;  Location: [facility];  Service: Orthopedics;  Laterality: Left;  . KNEE ARTHROSCOPY Left 08/19/2014   Procedure: ARTHROSCOPIC  WASHOUT LEFT KNEE;  Surgeon: Leandrew Koyanagi, MD;  Location: [facility];  Service: Orthopedics;  Laterality: Left;  . KNEE ARTHROSCOPY WITH LATERAL MENISECTOMY Left 04/04/2015   Procedure: AND PARTIAL LATERAL MENISECTOMY;  Surgeon: Leandrew Koyanagi, MD;  Location: [facility];  Service: Orthopedics;  Laterality: Left;  . KNEE ARTHROSCOPY WITH MEDIAL MENISECTOMY Left 04/04/2015   Procedure: LEFT KNEE ARTHROSCOPY WITH PARTIAL MEDIAL MENISCECTOMY  AND SYNOVECTOMY;  Surgeon: Leandrew Koyanagi, MD;  Location: [facility];  Service: Orthopedics;  Laterality: Left;  . PERIPHERAL VASCULAR BALLOON ANGIOPLASTY  07/07/2018   Procedure: PERIPHERAL VASCULAR BALLOON ANGIOPLASTY;  Surgeon: Marty Heck, MD;  Location: [facility];  Service: Cardiovascular;;  right AV fistula  . SHOULDER ARTHROSCOPY Bilateral 08/10/2014   Procedure: I & D BILATERAL SHOULDERS ;  Surgeon: Marianna Payment, MD;  Location: [facility];  Service: Orthopedics;  Laterality: Bilateral;  . SMALL INTESTINE SURGERY     Due to Small Bowel Obstruction; "fixed it when they did my gallbladder OR"  . TEE WITHOUT CARDIOVERSION N/A 08/14/2014   Procedure: TRANSESOPHAGEAL ECHOCARDIOGRAM (TEE);  Surgeon: Thayer Headings, MD;  Location: [facility];  Service: Cardiovascular;  Laterality: N/A;  . TENOSYNOVECTOMY Right 08/11/2014   Procedure: RIGHT WRIST IRRIGATION AND DEBRIDEMENT, TENOSYNOVECTOMY;  Surgeon: Marianna Payment, MD;  Location: [facility];  Service: Orthopedics;  Laterality: Right;  . TOTAL KNEE ARTHROPLASTY Left 11/07/2015   Procedure: LEFT TOTAL KNEE ARTHROPLASTY WITH REVISION OF IMPLANTS;  Surgeon: Leandrew Koyanagi, MD;  Location: [facility];  Service: Orthopedics;  Laterality: Left;  . TUBAL LIGATION      There were no vitals filed for this visit.     08/25/18 1022  Symptoms/Limitations  Subjective No new complaints. No falls to report.   Pertinent History left TFA, DM, neuropathy, Hep C, HTN, CHF, depression, CKD st 5  not dialysis, arthritis, heart murmur,   Limitations Standing;Walking;House hold activities  Patient Stated Goals To walk with prosthesis in home & community, travel plane, car & cruise in September  Pain Assessment  Currently in Pain? Yes  Pain Score 3  Pain Location Back  Pain Orientation Lower;Mid  Pain Descriptors / Indicators Aching;Sore  Pain Type Chronic pain  Pain Onset More than a month ago  Pain Frequency Intermittent  Aggravating Factors  increased activity, not taking her medicine  Pain Relieving Factors rest, medication  Multiple Pain Sites Yes  2nd Pain Site  Pain Score 5  Pain Location Leg (left residual limb)  Pain Orientation Left  Pain Descriptors / Indicators Aching;Sharp;Throbbing  Pain Type Phantom pain;Neuropathic pain  Pain Frequency Constant  Aggravating Factors  decrease in her Gabapentin (was decreased last week by her kidney PA at dialyisis  Pain Relieving Factors taking tylenol with no relieft, plans to see Dr. Sharol Given to see about getting the dosage increased to where it was      08/25/18 1028  Transfers  Transfers Sit to  Stand;Stand to Sit  Sit to Stand 5: Supervision;With upper extremity assist;With armrests;From chair/3-in-1  Stand to Sit 5: Supervision;With upper extremity assist;To chair/3-in-1;With armrests  Ambulation/Gait  Ambulation/Gait Yes  Ambulation/Gait Assistance 5: Supervision  Ambulation/Gait Assistance Details cues on posture and step placement  Ambulation Distance (Feet) 275 Feet (x1, )  Assistive device Rollator;Prosthesis  Gait Pattern Step-through pattern;Decreased step length - right;Decreased stance time - left;Decreased hip/knee flexion - left;Decreased weight shift to left;Left circumduction;Left hip hike;Antalgic;Lateral hip instability;Trunk flexed  Ambulation Surface Level;Indoor;Unlevel;Outdoor;Paved  Ramp 5: Supervision  Ramp Details (indicate cue type and reason) with rollator/prosthesis with cues on technique and  posture  Curb 5: Supervision  Curb Details (indicate cue type and reason) with rollator/prosthesis with cues on technique and safety with rollator.   Therapeutic Activites   Therapeutic Activities Other Therapeutic Activities  Other Therapeutic Activities with rollator support: pt is able to reach 10 inches forward and pick an object up from floor with single UE support with supervision.   Prosthetics  Current prosthetic wear tolerance (days/week)  6 days/week (not on _0 /11/20 1045  Balance Exercises: Standing  Standing Eyes Opened Narrow base of support (BOS);Head turns;Solid surface;Other reps (comment);Limitations  Standing Eyes Closed Narrow base of support (BOS);Solid surface;3 reps;30 secs;Limitations  Sidestepping Upper extremity support;3 reps;Limitations  Balance Exercises: Standing  Sidestepping Limitations with light UE support on bars for 3 laps each way with min guard to min assist for balance.  cues to lift feet each time vs siding feet along.               Standing Eyes Opened Limitations on floor with feet close together for head movements left<>right, then up<>down. no UE support with up to min assist for balance.  Standing Eyes Closed Limitations on floor with feet together: EC no head movements, no UE support with min guard to min assist for balance     PT Short Term Goals - 08/25/18 1026      PT SHORT TERM GOAL #1   Title  Patient verbalizes proper skin care including use of lotion at night & antiperspirant with prosthesis. (All updated STGs Target Date: 08/27/2018)     Baseline  08/25/2018: met today    Status  Achieved    Target Date  08/27/18      PT SHORT TERM GOAL #2   Title  patient reports prosthesis wear daily >/= 10 hrs/day    Baseline  08/25/18: wearing up to 8 hours a day  everyday except Sunday. improved, just not to goal.     Status  Partially Met    Target Date  08/27/18      PT SHORT TERM GOAL #3   Title  Patient able to reach 10" anteriorly and to floor with RW support with prosthesis safely.     Time  1    Period  Months    Status  On-going    Target Date  08/27/18      PT SHORT TERM GOAL #4   Title  Patient ambulates 350' outdoors on paved surfaces with rollator walker & prosthesis with supervision.     Status  Revised    Target Date  08/27/18      PT SHORT TERM GOAL #5   Title  Patient negotiates ramps &  curbs with rollator walker & prosthesis with supervision.     Status  Revised    Target Date  08/27/18        PT Long Term Goals - 08/16/18 2121      PT LONG TERM GOAL #1   Title   Patient verbalizes & demonstrates proper prosthetic care with new prosthetic design including prosthesis use at dialysis to enable safe use of prosthesis. (All LTGs Target Date: 09/26/2018)    Time  3    Period  Months    Status  On-going    Target Date  09/26/18      PT LONG TERM GOAL #2   Title  Patient tolerates & reports prosthesis wear daily >75% of awake hours without skin issues or limb pain.     Time  3    Period  Months    Status  On-going    Target Date  09/26/18      PT LONG TERM GOAL #3   Title  Patient ambulates 500' with one seated rest <3 minutes outdoors with rollator walker & prosthesis modified independent for community mobility.     Time  3    Period  Months    Status  On-going    Target Date  09/26/18      PT LONG TERM GOAL #4   Title  Patient negotiates ramps, curbs with rollator walker & stairs 1 rail/cane & prosthesis modified independent to enable community mobility.     Time  3    Period  Months    Status  On-going    Target Date  09/26/18      PT LONG TERM GOAL #5   Title  Improve standing balance with tasks of [place] Balance Test with walker support >50/56    Time  3    Period  Months    Status  On-going    Target  Date  09/26/18          08/25/18 1026  Plan  Clinical Impression Statement Today's skilled session addressed progress toward STGs. 3/5 met with 2/5 partially met.   Pt will benefit from skilled therapeutic intervention in order to improve on the following deficits Abnormal gait;Decreased activity tolerance;Decreased balance;Decreased endurance;Decreased knowledge of use of DME;Decreased mobility;Decreased strength;Decreased range of motion;Impaired flexibility;Prosthetic Dependency;Postural dysfunction;Increased edema;Dizziness  Rehab Potential Good  PT Frequency 2x / week  PT Duration 12 weeks  PT Treatment/Interventions ADLs/Self Care Home Management;Moist Heat;DME Instruction;Gait training;Stair training;Functional mobility training;Therapeutic activities;Therapeutic exercise;Balance training;Neuromuscular re-education;Patient/family education;Prosthetic Training;Manual techniques;Vestibular  PT Next Visit Plan continue toward LTGs  Consulted and Agree with Plan of Care Patient         Patient will benefit from skilled therapeutic intervention in order to improve the following deficits and impairments:  Abnormal gait, Decreased activity tolerance, Decreased balance, Decreased endurance, Decreased knowledge of use of DME, Decreased mobility, Decreased strength, Decreased range of motion, Impaired flexibility, Prosthetic Dependency, Postural dysfunction, Increased edema, Dizziness  Visit Diagnosis: Other abnormalities of gait and mobility  Muscle weakness (generalized)  Abnormal posture     Problem List Patient Active Problem List   Diagnosis Date Noted  . Benign essential HTN   . Stage 5 chronic kidney disease not on chronic dialysis ([facility])   . Above knee amputation status, left   . 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 Anemia of chronic disease 02/05/2017  . Dysplasia of cervix, high grade CIN 2 01/12/2017  . Chronic diastolic CHF (congestive  heart failure)  ([facility]) 11/08/2015  . Total knee replacement status   . Diabetic peripheral neuropathy ([place])   . Major depression, recurrent, chronic ([place])   . Chronic kidney disease (CKD), stage IV (severe) ([place])   . Hepatitis C 08/16/2014  . Peripheral neuropathy 06/24/2013  . Compulsive tobacco user syndrome 06/24/2013    Willow Ora, PTA, [facility] [address] [place], [place] [address] [phone] 08/26/18, 10:19 PM   Name: YENNI CARRA MRN: [id number] Date of Birth: May 11, 1956

## 2018-08-28 DIAGNOSIS — D509 Iron deficiency anemia, unspecified: Secondary | ICD-10-CM | POA: Diagnosis not present

## 2018-08-28 DIAGNOSIS — E1129 Type 2 diabetes mellitus with other diabetic kidney complication: Secondary | ICD-10-CM | POA: Diagnosis not present

## 2018-08-28 DIAGNOSIS — N186 End stage renal disease: Secondary | ICD-10-CM | POA: Diagnosis not present

## 2018-08-28 DIAGNOSIS — N2581 Secondary hyperparathyroidism of renal origin: Secondary | ICD-10-CM | POA: Diagnosis not present

## 2018-08-30 ENCOUNTER — Other Ambulatory Visit: Payer: Self-pay

## 2018-08-30 ENCOUNTER — Encounter: Payer: Self-pay | Admitting: Physical Therapy

## 2018-08-30 ENCOUNTER — Ambulatory Visit: Payer: Medicare Other | Admitting: Physical Therapy

## 2018-08-30 DIAGNOSIS — R293 Abnormal posture: Secondary | ICD-10-CM | POA: Diagnosis not present

## 2018-08-30 DIAGNOSIS — M6281 Muscle weakness (generalized): Secondary | ICD-10-CM

## 2018-08-30 DIAGNOSIS — R262 Difficulty in walking, not elsewhere classified: Secondary | ICD-10-CM | POA: Diagnosis not present

## 2018-08-30 DIAGNOSIS — R2689 Other abnormalities of gait and mobility: Secondary | ICD-10-CM

## 2018-08-30 DIAGNOSIS — M79605 Pain in left leg: Secondary | ICD-10-CM | POA: Diagnosis not present

## 2018-08-30 DIAGNOSIS — R2681 Unsteadiness on feet: Secondary | ICD-10-CM

## 2018-08-30 NOTE — Therapy (Signed)
[facility] [address] [place], [place], [address] Phone: [phone]   Fax:  [phone]  Physical Therapy Treatment  Patient Details  Name: Brandy Houston MRN: [id number] Date of Birth: Sep 02, 1955 Referring Provider (PT): Meridee Score, MD   Encounter Date: 08/30/2018  PT End of Session - 08/30/18 1024    Visit Number  17    Number of Visits  24    Date for PT Re-Evaluation  09/26/18    Authorization Type  Medicare & Medicaid    PT Start Time  1018    PT Stop Time  1100    PT Time Calculation (min)  42 min    Equipment Utilized During Treatment  Gait belt    Activity Tolerance  Patient tolerated treatment well;No increased pain;Patient limited by fatigue    Behavior During Therapy  [facility] for tasks assessed/performed       Past Medical History:  Diagnosis Date  . Anemia   . Anxiety   . Arthritis    "in my joints" (03/10/2017)  . Chronic diastolic CHF (congestive heart failure) ([place])   . CKD (chronic kidney disease), stage IV ([place])    stage IV. previous HD, none currently 10/30/15 (confirmed 02/05/2017 & 03/10/2017)  . Depression    Chronic  . Diabetic peripheral neuropathy ([place])   . Dysrhythmia    tachycardia, normal ECHO 08-09-14  . GERD (gastroesophageal reflux disease)   . Heart murmur     dx'd 02/05/2017  . Hepatitis C    "tx'd in 2016; I'm negative now" (02/05/2017)  . History of blood transfusion 2017   "w/knee replacement"  . Hypertension   . Osteoarthritis of left knee   . Peripheral neuropathy   . Protein calorie malnutrition ([place])   . Septic arthritis ([place])   . Slow transit constipation   . Type II diabetes mellitus ([name])    IDDM  . Uncontrolled hypertension 02/18/2013  . Unsteady gait     Past Surgical History:  Procedure Laterality Date  . A/V FISTULAGRAM Right 07/07/2018   Procedure: A/V FISTULAGRAM;  Surgeon: Marty Heck, MD;  Location: [facility];  Service: Cardiovascular;   Laterality: Right;  . AMPUTATION Left 03/20/2017   Procedure: LEFT ABOVE KNEE AMPUTATION;  Surgeon: Newt Minion, MD;  Location: [facility];  Service: Orthopedics;  Laterality: Left;  . APPENDECTOMY    . AV FISTULA PLACEMENT Left 09/13/2014   Procedure: Brachial Artery to Brachial Vein Gortex Four - Seven Stretch GRAFT INSERTION Left Forearm;  Surgeon: Mal Misty, MD;  Location: [facility];  Service: Vascular;  Laterality: Left;  . BASCILIC VEIN TRANSPOSITION Right 10/26/2017   Procedure: BASILIC VEIN TRANSPOSITION FIRST STAGE RIGHT ARM;  Surgeon: Conrad Kearney, MD;  Location: [facility];  Service: Vascular;  Laterality: Right;  . BASCILIC VEIN TRANSPOSITION Right 02/01/2018   Procedure: SECOND STAGE BASILIC VEIN TRANSPOSITION RIGHT UPPER EXTREMITY;  Surgeon: Marty Heck, MD;  Location: [facility];  Service: Vascular;  Laterality: Right;  . CHOLECYSTECTOMY OPEN    . COLON SURGERY    . EXCISIONAL TOTAL KNEE ARTHROPLASTY WITH ANTIBIOTIC SPACERS Left 02/06/2017   Procedure: Incisional total iknee with antibiotic spacer ;  Surgeon: Leandrew Koyanagi, MD;  Location: [facility];  Service: Orthopedics;  Laterality: Left;  . IR FLUORO GUIDE CV LINE RIGHT  02/10/2017  . IR REMOVAL TUN CV CATH W/O FL  04/01/2017  . IR US GUIDE VASC ACCESS RIGHT  02/10/2017  .  IRRIGATION AND DEBRIDEMENT KNEE Left 03/12/2017   Procedure: IRRIGATION AND DEBRIDEMENT LEFT KNEE WITH WOUND VAC APPLICATION;  Surgeon: Leandrew Koyanagi, MD;  Location: [facility];  Service: Orthopedics;  Laterality: Left;  . JOINT REPLACEMENT    . KNEE ARTHROSCOPY Right 08/10/2014   Procedure: ARTHROSCOPY I & D KNEE;  Surgeon: Marianna Payment, MD;  Location: [facility];  Service: Orthopedics;  Laterality: Right;  . KNEE ARTHROSCOPY Left 08/11/2014   Procedure: ARTHROSCOPIC WASHOUT LEFT KNEE;  Surgeon: Marianna Payment, MD;  Location: [facility];  Service: Orthopedics;  Laterality: Left;  . KNEE ARTHROSCOPY Left 08/19/2014   Procedure: ARTHROSCOPIC WASHOUT LEFT KNEE;  Surgeon:  Leandrew Koyanagi, MD;  Location: [facility];  Service: Orthopedics;  Laterality: Left;  . KNEE ARTHROSCOPY WITH LATERAL MENISECTOMY Left 04/04/2015   Procedure: AND PARTIAL LATERAL MENISECTOMY;  Surgeon: Leandrew Koyanagi, MD;  Location: [facility];  Service: Orthopedics;  Laterality: Left;  . KNEE ARTHROSCOPY WITH MEDIAL MENISECTOMY Left 04/04/2015   Procedure: LEFT KNEE ARTHROSCOPY WITH PARTIAL MEDIAL MENISCECTOMY  AND SYNOVECTOMY;  Surgeon: Leandrew Koyanagi, MD;  Location: [facility];  Service: Orthopedics;  Laterality: Left;  . PERIPHERAL VASCULAR BALLOON ANGIOPLASTY  07/07/2018   Procedure: PERIPHERAL VASCULAR BALLOON ANGIOPLASTY;  Surgeon: Marty Heck, MD;  Location: [facility];  Service: Cardiovascular;;  right AV fistula  . SHOULDER ARTHROSCOPY Bilateral 08/10/2014   Procedure: I & D BILATERAL SHOULDERS ;  Surgeon: Marianna Payment, MD;  Location: [facility];  Service: Orthopedics;  Laterality: Bilateral;  . SMALL INTESTINE SURGERY     Due to Small Bowel Obstruction; "fixed it when they did my gallbladder OR"  . TEE WITHOUT CARDIOVERSION N/A 08/14/2014   Procedure: TRANSESOPHAGEAL ECHOCARDIOGRAM (TEE);  Surgeon: Thayer Headings, MD;  Location: [facility];  Service: Cardiovascular;  Laterality: N/A;  . TENOSYNOVECTOMY Right 08/11/2014   Procedure: RIGHT WRIST IRRIGATION AND DEBRIDEMENT, TENOSYNOVECTOMY;  Surgeon: Marianna Payment, MD;  Location: [facility];  Service: Orthopedics;  Laterality: Right;  . TOTAL KNEE ARTHROPLASTY Left 11/07/2015   Procedure: LEFT TOTAL KNEE ARTHROPLASTY WITH REVISION OF IMPLANTS;  Surgeon: Leandrew Koyanagi, MD;  Location: [facility];  Service: Orthopedics;  Laterality: Left;  . TUBAL LIGATION      There were no vitals filed for this visit.  Subjective Assessment - 08/30/18 1022    Subjective  No new complaints. No falls to report. [name] on Friday who cut the top of the prosthesis down some due to it digging into her, has a bruise that is  healing. She plans to make another appt  as it is still digging in.     Pertinent History  left TFA, DM, neuropathy, Hep C, HTN, CHF, depression, CKD st 5 not dialysis, arthritis, heart murmur,     Limitations  Standing;Walking;House hold activities    Patient Stated Goals  To walk with prosthesis in home & community, travel plane, car & cruise in September    Currently in Pain?  No/denies    Pain Score  0-No pain            [facility] Adult PT Treatment/Exercise - 08/30/18 1024      Transfers   Transfers  Sit to Stand;Stand to Sit    Sit to Stand  5: Supervision;With upper extremity assist;With armrests;From chair/3-in-1    Stand to Sit  5: Supervision;With upper extremity assist;To chair/3-in-1;With armrests      Ambulation/Gait   Ambulation/Gait  Yes  Ambulation/Gait Assistance  5: Supervision;4: Min guard    Ambulation/Gait Assistance Details  continues to need cues on posture, rollator postion and to incr base of support with gait as prosthesis tends to stay in midline    Ambulation Distance (Feet)  340 Feet   x1, plus around gym   Assistive device  Rollator;Prosthesis    Gait Pattern  Step-through pattern;Decreased step length - right;Decreased stance time - left;Decreased hip/knee flexion - left;Decreased weight shift to left;Left circumduction;Left hip hike;Antalgic;Lateral hip instability;Trunk flexed    Ambulation Surface  Level;Indoor    Ramp  5: Supervision    Ramp Details (indicate cue type and reason)  with rollator/prosthesis, cues on posture, step length. demo's good use of brakes    Curb  5: Supervision    Curb Details (indicate cue type and reason)  with rollator/prosthesis, demo's safe use of brakes. cues for stance position for improved balance with weight shifitng       Knee/Hip Exercises: Aerobic   Other Aerobic  Scifit level 2.0 with UE's/right LE (prothesis propped on 4 inch box as it digs into groin too much to use). 8 minutes at level 2.0 with goal >/= 60 rpm  for strengthening and activity tolerance.       Prosthetics   Current prosthetic wear tolerance (days/week)   6 days/week   took Sunday off to allow bruise to heal   Current prosthetic wear tolerance (#hours/day)   8 hours /day    Residual limb condition   no skin issues per pt report except for healing bruise at hip/groin area from socket. Socket has been cut down on Friday to decr the digging in by prosthetist.           PT Short Term Goals - 08/25/18 1026      PT SHORT TERM GOAL #1   Title  Patient verbalizes proper skin care including use of lotion at night & antiperspirant with prosthesis. (All updated STGs Target Date: 08/27/2018)     Baseline  08/25/2018: met today    Status  Achieved    Target Date  08/27/18      PT SHORT TERM GOAL #2   Title  patient reports prosthesis wear daily >/= 10 hrs/day    Baseline  08/25/18: wearing up to 8 hours a day everyday except Sunday. improved, just not to goal.     Status  Partially Met    Target Date  08/27/18      PT SHORT TERM GOAL #3   Title  Patient able to reach 10" anteriorly and to floor with RW support with prosthesis safely.     Baseline  08/25/18: met today    Time  --    Period  --    Status  Achieved    Target Date  08/27/18      PT SHORT TERM GOAL #4   Title  Patient ambulates 350' outdoors on paved surfaces with rollator walker & prosthesis with supervision.     Baseline  08/25/18: met wtih assist with rollator/prosthesis, jsut not distance of 350    Status  Partially Met    Target Date  08/27/18      PT SHORT TERM GOAL #5   Title  Patient negotiates ramps & curbs with rollator walker & prosthesis with supervision.     Baseline  08/25/18: met today    Status  Achieved    Target Date  08/27/18        PT Long Term  Goals - 08/16/18 2121      PT LONG TERM GOAL #1   Title   Patient verbalizes & demonstrates proper prosthetic care with new prosthetic design including prosthesis use at dialysis to enable safe use of  prosthesis. (All LTGs Target Date: 09/26/2018)    Time  3    Period  Months    Status  On-going    Target Date  09/26/18      PT LONG TERM GOAL #2   Title  Patient tolerates & reports prosthesis wear daily >75% of awake hours without skin issues or limb pain.     Time  3    Period  Months    Status  On-going    Target Date  09/26/18      PT LONG TERM GOAL #3   Title  Patient ambulates 500' with one seated rest <3 minutes outdoors with rollator walker & prosthesis modified independent for community mobility.     Time  3    Period  Months    Status  On-going    Target Date  09/26/18      PT LONG TERM GOAL #4   Title  Patient negotiates ramps, curbs with rollator walker & stairs 1 rail/cane & prosthesis modified independent to enable community mobility.     Time  3    Period  Months    Status  On-going    Target Date  09/26/18      PT LONG TERM GOAL #5   Title  Improve standing balance with tasks of [place] Balance Test with walker support >50/56    Time  3    Period  Months    Status  On-going    Target Date  09/26/18            Plan - 08/30/18 1024    Clinical Impression Statement  Today's skilled session continued to focus on gait/barriers with rollator and on use of Scifit for strengthening/activity tolerance. No issues reported with session .The pt is making steady progress toward goals.     Rehab Potential  Good    PT Frequency  2x / week    PT Duration  12 weeks    PT Treatment/Interventions  ADLs/Self Care Home Management;Moist Heat;DME Instruction;Gait training;Stair training;Functional mobility training;Therapeutic activities;Therapeutic exercise;Balance training;Neuromuscular re-education;Patient/family education;Prosthetic Training;Manual techniques;Vestibular    PT Next Visit Plan  continue toward LTGs- static standing balance with decr UE support, gait/barriers with rollator/prosthesis, Scifit for strengthening/activity tolerance     Consulted and Agree with  Plan of Care  Patient       Patient will benefit from skilled therapeutic intervention in order to improve the following deficits and impairments:  Abnormal gait, Decreased activity tolerance, Decreased balance, Decreased endurance, Decreased knowledge of use of DME, Decreased mobility, Decreased strength, Decreased range of motion, Impaired flexibility, Prosthetic Dependency, Postural dysfunction, Increased edema, Dizziness  Visit Diagnosis: Other abnormalities of gait and mobility  Muscle weakness (generalized)  Abnormal posture  Unsteadiness on feet     Problem List Patient Active Problem List   Diagnosis Date Noted  . Benign essential HTN   . Stage 5 chronic kidney disease not on chronic dialysis ([place])   . Above knee amputation status, left   . DM type 2 causing CKD stage 5 ([place]) 03/21/2017  . Anemia of chronic disease 02/05/2017  . Dysplasia of cervix, high grade CIN 2 01/12/2017  . Chronic diastolic CHF (congestive heart failure) ([place]) 11/08/2015  . Total knee replacement  status   . Diabetic peripheral neuropathy ([place])   . Major depression, recurrent, chronic ([place])   . Chronic kidney disease (CKD), stage IV (severe) ([place])   . Hepatitis C 08/16/2014  . Peripheral neuropathy 06/24/2013  . Compulsive tobacco user syndrome 06/24/2013    Willow Ora, PTA, [facility] [address], [place], [place] [address] [phone] 08/30/18, 3:25 PM   Name: Brandy Houston MRN: [id number] Date of Birth: 1956/04/16

## 2018-08-31 DIAGNOSIS — N186 End stage renal disease: Secondary | ICD-10-CM | POA: Diagnosis not present

## 2018-08-31 DIAGNOSIS — N2581 Secondary hyperparathyroidism of renal origin: Secondary | ICD-10-CM | POA: Diagnosis not present

## 2018-08-31 DIAGNOSIS — D509 Iron deficiency anemia, unspecified: Secondary | ICD-10-CM | POA: Diagnosis not present

## 2018-08-31 DIAGNOSIS — E1129 Type 2 diabetes mellitus with other diabetic kidney complication: Secondary | ICD-10-CM | POA: Diagnosis not present

## 2018-09-01 ENCOUNTER — Encounter: Payer: Self-pay | Admitting: Physical Therapy

## 2018-09-01 ENCOUNTER — Ambulatory Visit: Payer: Medicare Other | Admitting: Physical Therapy

## 2018-09-01 DIAGNOSIS — R2689 Other abnormalities of gait and mobility: Secondary | ICD-10-CM | POA: Diagnosis not present

## 2018-09-01 DIAGNOSIS — R2681 Unsteadiness on feet: Secondary | ICD-10-CM | POA: Diagnosis not present

## 2018-09-01 DIAGNOSIS — M6281 Muscle weakness (generalized): Secondary | ICD-10-CM | POA: Diagnosis not present

## 2018-09-01 DIAGNOSIS — R262 Difficulty in walking, not elsewhere classified: Secondary | ICD-10-CM | POA: Diagnosis not present

## 2018-09-01 DIAGNOSIS — R293 Abnormal posture: Secondary | ICD-10-CM | POA: Diagnosis not present

## 2018-09-01 DIAGNOSIS — M79605 Pain in left leg: Secondary | ICD-10-CM | POA: Diagnosis not present

## 2018-09-02 DIAGNOSIS — D509 Iron deficiency anemia, unspecified: Secondary | ICD-10-CM | POA: Diagnosis not present

## 2018-09-02 DIAGNOSIS — N186 End stage renal disease: Secondary | ICD-10-CM | POA: Diagnosis not present

## 2018-09-02 DIAGNOSIS — N2581 Secondary hyperparathyroidism of renal origin: Secondary | ICD-10-CM | POA: Diagnosis not present

## 2018-09-02 DIAGNOSIS — E1129 Type 2 diabetes mellitus with other diabetic kidney complication: Secondary | ICD-10-CM | POA: Diagnosis not present

## 2018-09-02 NOTE — Therapy (Signed)
[facility] [address] [place], [place], [address] Phone: [phone]   Fax:  [phone]  Physical Therapy Treatment  Patient Details  Name: Brandy Houston MRN: [id number] Date of Birth: [birth year] Referring Provider (PT): Meridee Score, MD   Encounter Date: 09/01/2018  PT End of Session - 09/01/18 1131    Visit Number  18    Number of Visits  24    Date for PT Re-Evaluation  09/26/18    Authorization Type  Medicare & Medicaid    PT Start Time  1018    PT Stop Time  1100    PT Time Calculation (min)  42 min    Equipment Utilized During Treatment  Gait belt    Activity Tolerance  Patient tolerated treatment well;No increased pain;Patient limited by fatigue    Behavior During Therapy  [facility] for tasks assessed/performed       Past Medical History:  Diagnosis Date  . Anemia   . Anxiety   . Arthritis    "in my joints" (03/10/2017)  . Chronic diastolic CHF (congestive heart failure) ([place])   . CKD (chronic kidney disease), stage IV ([place])    stage IV. previous HD, none currently 10/30/15 (confirmed 02/05/2017 & 03/10/2017)  . Depression    Chronic  . Diabetic peripheral neuropathy ([place])   . Dysrhythmia    tachycardia, normal ECHO 08-09-14  . GERD (gastroesophageal reflux disease)   . Heart murmur     dx'd 02/05/2017  . Hepatitis C    "tx'd in 2016; I'm negative now" (02/05/2017)  . History of blood transfusion 2017   "w/knee replacement"  . Hypertension   . Osteoarthritis of left knee   . Peripheral neuropathy   . Protein calorie malnutrition ([place])   . Septic arthritis ([place])   . Slow transit constipation   . Type II diabetes mellitus ([name])    IDDM  . Uncontrolled hypertension 02/18/2013  . Unsteady gait     Past Surgical History:  Procedure Laterality Date  . A/V FISTULAGRAM Right 07/07/2018   Procedure: A/V FISTULAGRAM;  Surgeon: Marty Heck, MD;  Location: [facility];  Service: Cardiovascular;   Laterality: Right;  . AMPUTATION Left 03/20/2017   Procedure: LEFT ABOVE KNEE AMPUTATION;  Surgeon: Newt Minion, MD;  Location: [facility];  Service: Orthopedics;  Laterality: Left;  . APPENDECTOMY    . AV FISTULA PLACEMENT Left 09/13/2014   Procedure: Brachial Artery to Brachial Vein Gortex Four - Seven Stretch GRAFT INSERTION Left Forearm;  Surgeon: Mal Misty, MD;  Location: [facility];  Service: Vascular;  Laterality: Left;  . BASCILIC VEIN TRANSPOSITION Right 10/26/2017   Procedure: BASILIC VEIN TRANSPOSITION FIRST STAGE RIGHT ARM;  Surgeon: Conrad Lakeside, MD;  Location: [facility];  Service: Vascular;  Laterality: Right;  . BASCILIC VEIN TRANSPOSITION Right 02/01/2018   Procedure: SECOND STAGE BASILIC VEIN TRANSPOSITION RIGHT UPPER EXTREMITY;  Surgeon: Marty Heck, MD;  Location: [facility];  Service: Vascular;  Laterality: Right;  . CHOLECYSTECTOMY OPEN    . COLON SURGERY    . EXCISIONAL TOTAL KNEE ARTHROPLASTY WITH ANTIBIOTIC SPACERS Left 02/06/2017   Procedure: Incisional total iknee with antibiotic spacer ;  Surgeon: Leandrew Koyanagi, MD;  Location: [facility];  Service: Orthopedics;  Laterality: Left;  . IR FLUORO GUIDE CV LINE RIGHT  02/10/2017  . IR REMOVAL TUN CV CATH W/O FL  04/01/2017  . IR US GUIDE VASC ACCESS RIGHT  02/10/2017  .  IRRIGATION AND DEBRIDEMENT KNEE Left 03/12/2017   Procedure: IRRIGATION AND DEBRIDEMENT LEFT KNEE WITH WOUND VAC APPLICATION;  Surgeon: Leandrew Koyanagi, MD;  Location: [facility];  Service: Orthopedics;  Laterality: Left;  . JOINT REPLACEMENT    . KNEE ARTHROSCOPY Right 08/10/2014   Procedure: ARTHROSCOPY I & D KNEE;  Surgeon: Marianna Payment, MD;  Location: [facility];  Service: Orthopedics;  Laterality: Right;  . KNEE ARTHROSCOPY Left 08/11/2014   Procedure: ARTHROSCOPIC WASHOUT LEFT KNEE;  Surgeon: Marianna Payment, MD;  Location: [facility];  Service: Orthopedics;  Laterality: Left;  . KNEE ARTHROSCOPY Left 08/19/2014   Procedure: ARTHROSCOPIC WASHOUT LEFT KNEE;  Surgeon:  Leandrew Koyanagi, MD;  Location: [facility];  Service: Orthopedics;  Laterality: Left;  . KNEE ARTHROSCOPY WITH LATERAL MENISECTOMY Left 04/04/2015   Procedure: AND PARTIAL LATERAL MENISECTOMY;  Surgeon: Leandrew Koyanagi, MD;  Location: [facility];  Service: Orthopedics;  Laterality: Left;  . KNEE ARTHROSCOPY WITH MEDIAL MENISECTOMY Left 04/04/2015   Procedure: LEFT KNEE ARTHROSCOPY WITH PARTIAL MEDIAL MENISCECTOMY  AND SYNOVECTOMY;  Surgeon: Leandrew Koyanagi, MD;  Location: [facility];  Service: Orthopedics;  Laterality: Left;  . PERIPHERAL VASCULAR BALLOON ANGIOPLASTY  07/07/2018   Procedure: PERIPHERAL VASCULAR BALLOON ANGIOPLASTY;  Surgeon: Marty Heck, MD;  Location: [facility];  Service: Cardiovascular;;  right AV fistula  . SHOULDER ARTHROSCOPY Bilateral 08/10/2014   Procedure: I & D BILATERAL SHOULDERS ;  Surgeon: Marianna Payment, MD;  Location[facility];  Service: Orthopedics;  Laterality: Bilateral;  . SMALL INTESTINE SURGERY     Due to Small Bowel Obstruction; "fixed it when they did my gallbladder OR"  . TEE WITHOUT CARDIOVERSION N/A 08/14/2014   Procedure: TRANSESOPHAGEAL ECHOCARDIOGRAM (TEE);  Surgeon: Thayer Headings, MD;  Location: [facility];  Service: Cardiovascular;  Laterality: N/A;  . TENOSYNOVECTOMY Right 08/11/2014   Procedure: RIGHT WRIST IRRIGATION AND DEBRIDEMENT, TENOSYNOVECTOMY;  Surgeon: Marianna Payment, MD;  Location: [facility];  Service: Orthopedics;  Laterality: Right;  . TOTAL KNEE ARTHROPLASTY Left 11/07/2015   Procedure: LEFT TOTAL KNEE ARTHROPLASTY WITH REVISION OF IMPLANTS;  Surgeon: Leandrew Koyanagi, MD;  Location: [facility];  Service: Orthopedics;  Laterality: Left;  . TUBAL LIGATION      There were no vitals filed for this visit.  Subjective Assessment - 09/01/18 1024    Subjective  No falls. She has been getting down to dry weight at dialysis. She is going to visit son in [place] who lives in split level house (front door to upper  level ~5 steps 2 rails) & with 5 steps no rails to enter home.     Pertinent History  left TFA, DM, neuropathy, Hep C, HTN, CHF, depression, CKD st 5 not dialysis, arthritis, heart murmur,     Limitations  Standing;Walking;House hold activities    Patient Stated Goals  To walk with prosthesis in home & community, travel plane, car & cruise in September    Currently in Pain?  No/denies                       [facility] Adult PT Treatment/Exercise - 09/01/18 1020      Transfers   Transfers  Sit to Stand;Stand to Sit    Sit to Stand  5: Supervision;4: Min guard;With upper extremity assist;From chair/3-in-1   from chairs without armrests to cane on left side   Sit to Stand Details  Tactile cues for sequencing;Verbal  cues for sequencing;Verbal cues for technique    Stand to Sit  4: Min guard;5: Supervision;With upper extremity assist;To chair/3-in-1   to chair without armrests with cane on left side   Stand to Sit Details (indicate cue type and reason)  Tactile cues for sequencing;Verbal cues for sequencing;Verbal cues for technique      Ambulation/Gait   Ambulation/Gait  Yes    Ambulation/Gait Assistance  5: Supervision;4: Min assist;4: Min guard   supervision rollator & minA/min guard cane   Ambulation/Gait Assistance Details  inside parallel bars with intermittent touch: verbal & tactile cues on wt shift over prosthesis in stance. worked on ambulating with cane.     Ambulation Distance (Feet)  15 Feet   15' X 5 in parallel bars with cane. enter /exit with rollato   Assistive device  Rollator;Prosthesis;Straight cane   quad tip on cane   Ambulation Surface  Indoor;Level    Stairs  Yes    Stairs Assistance  4: Min assist    Stairs Assistance Details (indicate cue type and reason)  verbal, demo, & tactile cues on technique if no rails available like 5 steps to enter son's house. Used pt phone to videotape with PT giving verbal directions.      Stair Management Technique  Step to  pattern;Forwards;With cane   cane & hand hold assist   Number of Stairs  4   3 reps     Prosthetics   Current prosthetic wear tolerance (days/week)   7 days/wk    Current prosthetic wear tolerance (#hours/day)   8-10 hrs/day             PT Education - 09/01/18 1120    Education provided  Yes    Education Details  negotiating steps without rails present and how family to assist    Person(s) Educated  Patient    Methods  Explanation;Demonstration;Tactile cues;Verbal cues;Other (comment)   patient videotaped on her phone   Comprehension  Verbalized understanding;Returned demonstration       PT Short Term Goals - 08/25/18 1026      PT SHORT TERM GOAL #1   Title  Patient verbalizes proper skin care including use of lotion at night & antiperspirant with prosthesis. (All updated STGs Target Date: 08/27/2018)     Baseline  08/25/2018: met today    Status  Achieved    Target Date  08/27/18      PT SHORT TERM GOAL #2   Title  patient reports prosthesis wear daily >/= 10 hrs/day    Baseline  08/25/18: wearing up to 8 hours a day everyday except Sunday. improved, just not to goal.     Status  Partially Met    Target Date  08/27/18      PT SHORT TERM GOAL #3   Title  Patient able to reach 10" anteriorly and to floor with RW support with prosthesis safely.     Baseline  08/25/18: met today    Time  --    Period  --    Status  Achieved    Target Date  08/27/18      PT SHORT TERM GOAL #4   Title  Patient ambulates 350' outdoors on paved surfaces with rollator walker & prosthesis with supervision.     Baseline  08/25/18: met wtih assist with rollator/prosthesis, jsut not distance of 350    Status  Partially Met    Target Date  08/27/18      PT SHORT TERM GOAL #  5   Title  Patient negotiates ramps & curbs with rollator walker & prosthesis with supervision.     Baseline  08/25/18: met today    Status  Achieved    Target Date  08/27/18        PT Long Term Goals - 08/16/18 2121       PT LONG TERM GOAL #1   Title   Patient verbalizes & demonstrates proper prosthetic care with new prosthetic design including prosthesis use at dialysis to enable safe use of prosthesis. (All LTGs Target Date: 09/26/2018)    Time  3    Period  Months    Status  On-going    Target Date  09/26/18      PT LONG TERM GOAL #2   Title  Patient tolerates & reports prosthesis wear daily >75% of awake hours without skin issues or limb pain.     Time  3    Period  Months    Status  On-going    Target Date  09/26/18      PT LONG TERM GOAL #3   Title  Patient ambulates 500' with one seated rest <3 minutes outdoors with rollator walker & prosthesis modified independent for community mobility.     Time  3    Period  Months    Status  On-going    Target Date  09/26/18      PT LONG TERM GOAL #4   Title  Patient negotiates ramps, curbs with rollator walker & stairs 1 rail/cane & prosthesis modified independent to enable community mobility.     Time  3    Period  Months    Status  On-going    Target Date  09/26/18      PT LONG TERM GOAL #5   Title  Improve standing balance with tasks of Berg Balance Test with walker support >50/56    Time  3    Period  Months    Status  On-going    Target Date  09/26/18            Plan - 09/01/18 1131    Clinical Impression Statement  Today's skilled session focused on educating patient how to negotiate stairs with no rail available similar to son's house that she plans to visit this weekend. PT also worked on sit to/from stand & short distance gait with cane.     Rehab Potential  Good    PT Frequency  2x / week    PT Duration  12 weeks    PT Treatment/Interventions  ADLs/Self Care Home Management;Moist Heat;DME Instruction;Gait training;Stair training;Functional mobility training;Therapeutic activities;Therapeutic exercise;Balance training;Neuromuscular re-education;Patient/family education;Prosthetic Training;Manual techniques;Vestibular    PT Next  Visit Plan  continue toward LTGs- static standing balance with decr UE support, gait/barriers with rollator/prosthesis, Scifit for strengthening/activity tolerance     Consulted and Agree with Plan of Care  Patient       Patient will benefit from skilled therapeutic intervention in order to improve the following deficits and impairments:  Abnormal gait, Decreased activity tolerance, Decreased balance, Decreased endurance, Decreased knowledge of use of DME, Decreased mobility, Decreased strength, Decreased range of motion, Impaired flexibility, Prosthetic Dependency, Postural dysfunction, Increased edema, Dizziness  Visit Diagnosis: Other abnormalities of gait and mobility  Muscle weakness (generalized)  Abnormal posture  Unsteadiness on feet     Problem List Patient Active Problem List   Diagnosis Date Noted  . Benign essential HTN   . Stage 5 chronic kidney disease  not on chronic dialysis ([place])   . Above knee amputation status, left   . DM type 2 causing CKD stage 5 ([place]) 03/21/2017  . Anemia of chronic disease 02/05/2017  . Dysplasia of cervix, high grade CIN 2 01/12/2017  . Chronic diastolic CHF (congestive heart failure) ([place]) 11/08/2015  . Total knee replacement status   . Diabetic peripheral neuropathy ([place])   . Major depression, recurrent, chronic ([place])   . Chronic kidney disease (CKD), stage IV (severe) ([place])   . Hepatitis C 08/16/2014  . Peripheral neuropathy 06/24/2013  . Compulsive tobacco user syndrome 06/24/2013    Jamey Reas PT, DPT 09/02/2018, 11:33 AM  [facility] [address] [place], [place], [address] Phone: [phone]   Fax:  [phone]  Name: Brandy Houston MRN: [id number] Date of Birth: 1955/10/27

## 2018-09-04 DIAGNOSIS — N186 End stage renal disease: Secondary | ICD-10-CM | POA: Diagnosis not present

## 2018-09-04 DIAGNOSIS — E1129 Type 2 diabetes mellitus with other diabetic kidney complication: Secondary | ICD-10-CM | POA: Diagnosis not present

## 2018-09-04 DIAGNOSIS — D509 Iron deficiency anemia, unspecified: Secondary | ICD-10-CM | POA: Diagnosis not present

## 2018-09-04 DIAGNOSIS — N2581 Secondary hyperparathyroidism of renal origin: Secondary | ICD-10-CM | POA: Diagnosis not present

## 2018-09-06 ENCOUNTER — Ambulatory Visit: Payer: Medicare Other | Admitting: Physical Therapy

## 2018-09-06 ENCOUNTER — Telehealth: Payer: Self-pay | Admitting: Physical Therapy

## 2018-09-06 ENCOUNTER — Ambulatory Visit (INDEPENDENT_AMBULATORY_CARE_PROVIDER_SITE_OTHER): Payer: Medicare Other | Admitting: Orthopedic Surgery

## 2018-09-06 NOTE — Telephone Encounter (Signed)
Brandy Houston was contacted today regarding the temporary closing of [facility] due to Covid-19.  Therapist discussed pt's current home program with pt reporting no issues. Pt also reports wearing her prosthesis as advised. Informed the pt that if any further schedule changes are to occur our office will contact her. The pt verbalized understanding.    [facility] will follow up with patients when we are able to resume care.  Brandy Houston, PTA, [facility] [address] [place], [place] [address] [phone] 09/06/18, 3:22 PM   [facility] [address] [place], [place]  [address] Phone:  [phone] Fax:  [phone] \

## 2018-09-07 DIAGNOSIS — N2581 Secondary hyperparathyroidism of renal origin: Secondary | ICD-10-CM | POA: Diagnosis not present

## 2018-09-07 DIAGNOSIS — E1129 Type 2 diabetes mellitus with other diabetic kidney complication: Secondary | ICD-10-CM | POA: Diagnosis not present

## 2018-09-07 DIAGNOSIS — N186 End stage renal disease: Secondary | ICD-10-CM | POA: Diagnosis not present

## 2018-09-07 DIAGNOSIS — D509 Iron deficiency anemia, unspecified: Secondary | ICD-10-CM | POA: Diagnosis not present

## 2018-09-08 ENCOUNTER — Ambulatory Visit: Payer: Medicare Other | Admitting: Physical Therapy

## 2018-09-09 DIAGNOSIS — N2581 Secondary hyperparathyroidism of renal origin: Secondary | ICD-10-CM | POA: Diagnosis not present

## 2018-09-09 DIAGNOSIS — E1129 Type 2 diabetes mellitus with other diabetic kidney complication: Secondary | ICD-10-CM | POA: Diagnosis not present

## 2018-09-09 DIAGNOSIS — N186 End stage renal disease: Secondary | ICD-10-CM | POA: Diagnosis not present

## 2018-09-09 DIAGNOSIS — D509 Iron deficiency anemia, unspecified: Secondary | ICD-10-CM | POA: Diagnosis not present

## 2018-09-11 DIAGNOSIS — N2581 Secondary hyperparathyroidism of renal origin: Secondary | ICD-10-CM | POA: Diagnosis not present

## 2018-09-11 DIAGNOSIS — N186 End stage renal disease: Secondary | ICD-10-CM | POA: Diagnosis not present

## 2018-09-11 DIAGNOSIS — D509 Iron deficiency anemia, unspecified: Secondary | ICD-10-CM | POA: Diagnosis not present

## 2018-09-11 DIAGNOSIS — E1129 Type 2 diabetes mellitus with other diabetic kidney complication: Secondary | ICD-10-CM | POA: Diagnosis not present

## 2018-09-13 ENCOUNTER — Encounter: Payer: Medicare Other | Admitting: Physical Therapy

## 2018-09-14 DIAGNOSIS — N2581 Secondary hyperparathyroidism of renal origin: Secondary | ICD-10-CM | POA: Diagnosis not present

## 2018-09-14 DIAGNOSIS — D509 Iron deficiency anemia, unspecified: Secondary | ICD-10-CM | POA: Diagnosis not present

## 2018-09-14 DIAGNOSIS — N186 End stage renal disease: Secondary | ICD-10-CM | POA: Diagnosis not present

## 2018-09-14 DIAGNOSIS — E1129 Type 2 diabetes mellitus with other diabetic kidney complication: Secondary | ICD-10-CM | POA: Diagnosis not present

## 2018-09-15 ENCOUNTER — Encounter: Payer: Medicare Other | Admitting: Physical Therapy

## 2018-09-15 DIAGNOSIS — E1129 Type 2 diabetes mellitus with other diabetic kidney complication: Secondary | ICD-10-CM | POA: Diagnosis not present

## 2018-09-15 DIAGNOSIS — Z992 Dependence on renal dialysis: Secondary | ICD-10-CM | POA: Diagnosis not present

## 2018-09-15 DIAGNOSIS — N186 End stage renal disease: Secondary | ICD-10-CM | POA: Diagnosis not present

## 2018-09-16 DIAGNOSIS — D509 Iron deficiency anemia, unspecified: Secondary | ICD-10-CM | POA: Diagnosis not present

## 2018-09-16 DIAGNOSIS — N186 End stage renal disease: Secondary | ICD-10-CM | POA: Diagnosis not present

## 2018-09-16 DIAGNOSIS — N2581 Secondary hyperparathyroidism of renal origin: Secondary | ICD-10-CM | POA: Diagnosis not present

## 2018-09-16 DIAGNOSIS — D631 Anemia in chronic kidney disease: Secondary | ICD-10-CM | POA: Diagnosis not present

## 2018-09-16 DIAGNOSIS — E1129 Type 2 diabetes mellitus with other diabetic kidney complication: Secondary | ICD-10-CM | POA: Diagnosis not present

## 2018-09-18 DIAGNOSIS — D631 Anemia in chronic kidney disease: Secondary | ICD-10-CM | POA: Diagnosis not present

## 2018-09-18 DIAGNOSIS — N2581 Secondary hyperparathyroidism of renal origin: Secondary | ICD-10-CM | POA: Diagnosis not present

## 2018-09-18 DIAGNOSIS — D509 Iron deficiency anemia, unspecified: Secondary | ICD-10-CM | POA: Diagnosis not present

## 2018-09-18 DIAGNOSIS — E1129 Type 2 diabetes mellitus with other diabetic kidney complication: Secondary | ICD-10-CM | POA: Diagnosis not present

## 2018-09-18 DIAGNOSIS — N186 End stage renal disease: Secondary | ICD-10-CM | POA: Diagnosis not present

## 2018-09-20 ENCOUNTER — Encounter: Payer: Medicare Other | Admitting: Physical Therapy

## 2018-09-20 ENCOUNTER — Other Ambulatory Visit: Payer: Self-pay | Admitting: *Deleted

## 2018-09-20 ENCOUNTER — Other Ambulatory Visit: Payer: Self-pay | Admitting: Family Medicine

## 2018-09-20 MED ORDER — ACCU-CHEK SOFTCLIX LANCETS MISC: 12 refills | Status: AC

## 2018-09-20 MED ORDER — INSULIN GLARGINE 100 UNIT/ML SOLOSTAR PEN: 12.0000 [IU] | PEN_INJECTOR | Freq: Every day | SUBCUTANEOUS | 0 refills | Status: DC

## 2018-09-20 NOTE — Telephone Encounter (Signed)
Copied from [place] [id number]. Topic: Quick Communication - Rx Refill/Question >> Sep 20, 2018 11:48 AM Blase Mess A wrote: Medication: Lancet Devices [facility]) lancets [[id number]]   Has the patient contacted their pharmacy? Yes (Agent: If no, request that the patient contact the pharmacy for the refill.) (Agent: If yes, when and what did the pharmacy advise?)  Preferred Pharmacy (with phone number or street name): [place], [place] - [address] [phone] (Phone) [phone] (Fax)    Agent: Please be advised that RX refills may take up to 3 business days. We ask that you follow-up with your pharmacy.

## 2018-09-20 NOTE — Telephone Encounter (Signed)
Lancets refilled to the [facility]. LMOVM

## 2018-09-21 DIAGNOSIS — D631 Anemia in chronic kidney disease: Secondary | ICD-10-CM | POA: Diagnosis not present

## 2018-09-21 DIAGNOSIS — D509 Iron deficiency anemia, unspecified: Secondary | ICD-10-CM | POA: Diagnosis not present

## 2018-09-21 DIAGNOSIS — N2581 Secondary hyperparathyroidism of renal origin: Secondary | ICD-10-CM | POA: Diagnosis not present

## 2018-09-21 DIAGNOSIS — N186 End stage renal disease: Secondary | ICD-10-CM | POA: Diagnosis not present

## 2018-09-21 DIAGNOSIS — E1129 Type 2 diabetes mellitus with other diabetic kidney complication: Secondary | ICD-10-CM | POA: Diagnosis not present

## 2018-09-22 ENCOUNTER — Encounter: Payer: Medicare Other | Admitting: Physical Therapy

## 2018-09-23 DIAGNOSIS — N2581 Secondary hyperparathyroidism of renal origin: Secondary | ICD-10-CM | POA: Diagnosis not present

## 2018-09-23 DIAGNOSIS — N186 End stage renal disease: Secondary | ICD-10-CM | POA: Diagnosis not present

## 2018-09-23 DIAGNOSIS — D631 Anemia in chronic kidney disease: Secondary | ICD-10-CM | POA: Diagnosis not present

## 2018-09-23 DIAGNOSIS — E1129 Type 2 diabetes mellitus with other diabetic kidney complication: Secondary | ICD-10-CM | POA: Diagnosis not present

## 2018-09-23 DIAGNOSIS — D509 Iron deficiency anemia, unspecified: Secondary | ICD-10-CM | POA: Diagnosis not present

## 2018-09-25 DIAGNOSIS — N2581 Secondary hyperparathyroidism of renal origin: Secondary | ICD-10-CM | POA: Diagnosis not present

## 2018-09-25 DIAGNOSIS — D509 Iron deficiency anemia, unspecified: Secondary | ICD-10-CM | POA: Diagnosis not present

## 2018-09-25 DIAGNOSIS — E1129 Type 2 diabetes mellitus with other diabetic kidney complication: Secondary | ICD-10-CM | POA: Diagnosis not present

## 2018-09-25 DIAGNOSIS — D631 Anemia in chronic kidney disease: Secondary | ICD-10-CM | POA: Diagnosis not present

## 2018-09-25 DIAGNOSIS — N186 End stage renal disease: Secondary | ICD-10-CM | POA: Diagnosis not present

## 2018-09-28 DIAGNOSIS — E1129 Type 2 diabetes mellitus with other diabetic kidney complication: Secondary | ICD-10-CM | POA: Diagnosis not present

## 2018-09-28 DIAGNOSIS — D509 Iron deficiency anemia, unspecified: Secondary | ICD-10-CM | POA: Diagnosis not present

## 2018-09-28 DIAGNOSIS — N2581 Secondary hyperparathyroidism of renal origin: Secondary | ICD-10-CM | POA: Diagnosis not present

## 2018-09-28 DIAGNOSIS — N186 End stage renal disease: Secondary | ICD-10-CM | POA: Diagnosis not present

## 2018-09-28 DIAGNOSIS — D631 Anemia in chronic kidney disease: Secondary | ICD-10-CM | POA: Diagnosis not present

## 2018-09-30 DIAGNOSIS — N186 End stage renal disease: Secondary | ICD-10-CM | POA: Diagnosis not present

## 2018-09-30 DIAGNOSIS — D509 Iron deficiency anemia, unspecified: Secondary | ICD-10-CM | POA: Diagnosis not present

## 2018-09-30 DIAGNOSIS — E1129 Type 2 diabetes mellitus with other diabetic kidney complication: Secondary | ICD-10-CM | POA: Diagnosis not present

## 2018-09-30 DIAGNOSIS — D631 Anemia in chronic kidney disease: Secondary | ICD-10-CM | POA: Diagnosis not present

## 2018-09-30 DIAGNOSIS — N2581 Secondary hyperparathyroidism of renal origin: Secondary | ICD-10-CM | POA: Diagnosis not present

## 2018-10-02 DIAGNOSIS — E1129 Type 2 diabetes mellitus with other diabetic kidney complication: Secondary | ICD-10-CM | POA: Diagnosis not present

## 2018-10-02 DIAGNOSIS — D631 Anemia in chronic kidney disease: Secondary | ICD-10-CM | POA: Diagnosis not present

## 2018-10-02 DIAGNOSIS — N186 End stage renal disease: Secondary | ICD-10-CM | POA: Diagnosis not present

## 2018-10-02 DIAGNOSIS — D509 Iron deficiency anemia, unspecified: Secondary | ICD-10-CM | POA: Diagnosis not present

## 2018-10-02 DIAGNOSIS — N2581 Secondary hyperparathyroidism of renal origin: Secondary | ICD-10-CM | POA: Diagnosis not present

## 2018-10-07 DIAGNOSIS — N2581 Secondary hyperparathyroidism of renal origin: Secondary | ICD-10-CM | POA: Diagnosis not present

## 2018-10-07 DIAGNOSIS — N186 End stage renal disease: Secondary | ICD-10-CM | POA: Diagnosis not present

## 2018-10-07 DIAGNOSIS — E1129 Type 2 diabetes mellitus with other diabetic kidney complication: Secondary | ICD-10-CM | POA: Diagnosis not present

## 2018-10-07 DIAGNOSIS — D631 Anemia in chronic kidney disease: Secondary | ICD-10-CM | POA: Diagnosis not present

## 2018-10-07 DIAGNOSIS — D509 Iron deficiency anemia, unspecified: Secondary | ICD-10-CM | POA: Diagnosis not present

## 2018-10-09 DIAGNOSIS — N186 End stage renal disease: Secondary | ICD-10-CM | POA: Diagnosis not present

## 2018-10-09 DIAGNOSIS — N2581 Secondary hyperparathyroidism of renal origin: Secondary | ICD-10-CM | POA: Diagnosis not present

## 2018-10-09 DIAGNOSIS — D631 Anemia in chronic kidney disease: Secondary | ICD-10-CM | POA: Diagnosis not present

## 2018-10-09 DIAGNOSIS — E1129 Type 2 diabetes mellitus with other diabetic kidney complication: Secondary | ICD-10-CM | POA: Diagnosis not present

## 2018-10-09 DIAGNOSIS — D509 Iron deficiency anemia, unspecified: Secondary | ICD-10-CM | POA: Diagnosis not present

## 2018-10-12 DIAGNOSIS — D509 Iron deficiency anemia, unspecified: Secondary | ICD-10-CM | POA: Diagnosis not present

## 2018-10-12 DIAGNOSIS — E1129 Type 2 diabetes mellitus with other diabetic kidney complication: Secondary | ICD-10-CM | POA: Diagnosis not present

## 2018-10-12 DIAGNOSIS — D631 Anemia in chronic kidney disease: Secondary | ICD-10-CM | POA: Diagnosis not present

## 2018-10-12 DIAGNOSIS — N2581 Secondary hyperparathyroidism of renal origin: Secondary | ICD-10-CM | POA: Diagnosis not present

## 2018-10-12 DIAGNOSIS — N186 End stage renal disease: Secondary | ICD-10-CM | POA: Diagnosis not present

## 2018-10-14 DIAGNOSIS — N186 End stage renal disease: Secondary | ICD-10-CM | POA: Diagnosis not present

## 2018-10-14 DIAGNOSIS — D631 Anemia in chronic kidney disease: Secondary | ICD-10-CM | POA: Diagnosis not present

## 2018-10-14 DIAGNOSIS — N2581 Secondary hyperparathyroidism of renal origin: Secondary | ICD-10-CM | POA: Diagnosis not present

## 2018-10-14 DIAGNOSIS — D509 Iron deficiency anemia, unspecified: Secondary | ICD-10-CM | POA: Diagnosis not present

## 2018-10-14 DIAGNOSIS — E1129 Type 2 diabetes mellitus with other diabetic kidney complication: Secondary | ICD-10-CM | POA: Diagnosis not present

## 2018-10-15 DIAGNOSIS — E1129 Type 2 diabetes mellitus with other diabetic kidney complication: Secondary | ICD-10-CM | POA: Diagnosis not present

## 2018-10-15 DIAGNOSIS — N186 End stage renal disease: Secondary | ICD-10-CM | POA: Diagnosis not present

## 2018-10-15 DIAGNOSIS — Z992 Dependence on renal dialysis: Secondary | ICD-10-CM | POA: Diagnosis not present

## 2018-10-16 DIAGNOSIS — N2581 Secondary hyperparathyroidism of renal origin: Secondary | ICD-10-CM | POA: Diagnosis not present

## 2018-10-16 DIAGNOSIS — E1129 Type 2 diabetes mellitus with other diabetic kidney complication: Secondary | ICD-10-CM | POA: Diagnosis not present

## 2018-10-16 DIAGNOSIS — N186 End stage renal disease: Secondary | ICD-10-CM | POA: Diagnosis not present

## 2018-10-16 DIAGNOSIS — E8779 Other fluid overload: Secondary | ICD-10-CM | POA: Diagnosis not present

## 2018-10-16 DIAGNOSIS — D509 Iron deficiency anemia, unspecified: Secondary | ICD-10-CM | POA: Diagnosis not present

## 2018-10-16 DIAGNOSIS — D631 Anemia in chronic kidney disease: Secondary | ICD-10-CM | POA: Diagnosis not present

## 2018-10-19 DIAGNOSIS — N2581 Secondary hyperparathyroidism of renal origin: Secondary | ICD-10-CM | POA: Diagnosis not present

## 2018-10-19 DIAGNOSIS — D631 Anemia in chronic kidney disease: Secondary | ICD-10-CM | POA: Diagnosis not present

## 2018-10-19 DIAGNOSIS — E1129 Type 2 diabetes mellitus with other diabetic kidney complication: Secondary | ICD-10-CM | POA: Diagnosis not present

## 2018-10-19 DIAGNOSIS — E8779 Other fluid overload: Secondary | ICD-10-CM | POA: Diagnosis not present

## 2018-10-19 DIAGNOSIS — D509 Iron deficiency anemia, unspecified: Secondary | ICD-10-CM | POA: Diagnosis not present

## 2018-10-19 DIAGNOSIS — N186 End stage renal disease: Secondary | ICD-10-CM | POA: Diagnosis not present

## 2018-10-21 DIAGNOSIS — N2581 Secondary hyperparathyroidism of renal origin: Secondary | ICD-10-CM | POA: Diagnosis not present

## 2018-10-21 DIAGNOSIS — E8779 Other fluid overload: Secondary | ICD-10-CM | POA: Diagnosis not present

## 2018-10-21 DIAGNOSIS — E1129 Type 2 diabetes mellitus with other diabetic kidney complication: Secondary | ICD-10-CM | POA: Diagnosis not present

## 2018-10-21 DIAGNOSIS — N186 End stage renal disease: Secondary | ICD-10-CM | POA: Diagnosis not present

## 2018-10-21 DIAGNOSIS — D631 Anemia in chronic kidney disease: Secondary | ICD-10-CM | POA: Diagnosis not present

## 2018-10-21 DIAGNOSIS — D509 Iron deficiency anemia, unspecified: Secondary | ICD-10-CM | POA: Diagnosis not present

## 2018-10-23 DIAGNOSIS — E1129 Type 2 diabetes mellitus with other diabetic kidney complication: Secondary | ICD-10-CM | POA: Diagnosis not present

## 2018-10-23 DIAGNOSIS — N186 End stage renal disease: Secondary | ICD-10-CM | POA: Diagnosis not present

## 2018-10-23 DIAGNOSIS — D509 Iron deficiency anemia, unspecified: Secondary | ICD-10-CM | POA: Diagnosis not present

## 2018-10-23 DIAGNOSIS — D631 Anemia in chronic kidney disease: Secondary | ICD-10-CM | POA: Diagnosis not present

## 2018-10-23 DIAGNOSIS — E8779 Other fluid overload: Secondary | ICD-10-CM | POA: Diagnosis not present

## 2018-10-23 DIAGNOSIS — N2581 Secondary hyperparathyroidism of renal origin: Secondary | ICD-10-CM | POA: Diagnosis not present

## 2018-10-26 DIAGNOSIS — E1129 Type 2 diabetes mellitus with other diabetic kidney complication: Secondary | ICD-10-CM | POA: Diagnosis not present

## 2018-10-26 DIAGNOSIS — N2581 Secondary hyperparathyroidism of renal origin: Secondary | ICD-10-CM | POA: Diagnosis not present

## 2018-10-26 DIAGNOSIS — D509 Iron deficiency anemia, unspecified: Secondary | ICD-10-CM | POA: Diagnosis not present

## 2018-10-26 DIAGNOSIS — N186 End stage renal disease: Secondary | ICD-10-CM | POA: Diagnosis not present

## 2018-10-26 DIAGNOSIS — E8779 Other fluid overload: Secondary | ICD-10-CM | POA: Diagnosis not present

## 2018-10-26 DIAGNOSIS — D631 Anemia in chronic kidney disease: Secondary | ICD-10-CM | POA: Diagnosis not present

## 2018-10-28 DIAGNOSIS — E1129 Type 2 diabetes mellitus with other diabetic kidney complication: Secondary | ICD-10-CM | POA: Diagnosis not present

## 2018-10-28 DIAGNOSIS — N186 End stage renal disease: Secondary | ICD-10-CM | POA: Diagnosis not present

## 2018-10-28 DIAGNOSIS — D509 Iron deficiency anemia, unspecified: Secondary | ICD-10-CM | POA: Diagnosis not present

## 2018-10-28 DIAGNOSIS — D631 Anemia in chronic kidney disease: Secondary | ICD-10-CM | POA: Diagnosis not present

## 2018-10-28 DIAGNOSIS — E8779 Other fluid overload: Secondary | ICD-10-CM | POA: Diagnosis not present

## 2018-10-28 DIAGNOSIS — N2581 Secondary hyperparathyroidism of renal origin: Secondary | ICD-10-CM | POA: Diagnosis not present

## 2018-10-30 DIAGNOSIS — E8779 Other fluid overload: Secondary | ICD-10-CM | POA: Diagnosis not present

## 2018-10-30 DIAGNOSIS — N186 End stage renal disease: Secondary | ICD-10-CM | POA: Diagnosis not present

## 2018-10-30 DIAGNOSIS — D509 Iron deficiency anemia, unspecified: Secondary | ICD-10-CM | POA: Diagnosis not present

## 2018-10-30 DIAGNOSIS — N2581 Secondary hyperparathyroidism of renal origin: Secondary | ICD-10-CM | POA: Diagnosis not present

## 2018-10-30 DIAGNOSIS — E1129 Type 2 diabetes mellitus with other diabetic kidney complication: Secondary | ICD-10-CM | POA: Diagnosis not present

## 2018-10-30 DIAGNOSIS — D631 Anemia in chronic kidney disease: Secondary | ICD-10-CM | POA: Diagnosis not present

## 2018-11-02 DIAGNOSIS — N186 End stage renal disease: Secondary | ICD-10-CM | POA: Diagnosis not present

## 2018-11-02 DIAGNOSIS — D509 Iron deficiency anemia, unspecified: Secondary | ICD-10-CM | POA: Diagnosis not present

## 2018-11-02 DIAGNOSIS — N2581 Secondary hyperparathyroidism of renal origin: Secondary | ICD-10-CM | POA: Diagnosis not present

## 2018-11-02 DIAGNOSIS — E8779 Other fluid overload: Secondary | ICD-10-CM | POA: Diagnosis not present

## 2018-11-02 DIAGNOSIS — D631 Anemia in chronic kidney disease: Secondary | ICD-10-CM | POA: Diagnosis not present

## 2018-11-02 DIAGNOSIS — E1129 Type 2 diabetes mellitus with other diabetic kidney complication: Secondary | ICD-10-CM | POA: Diagnosis not present

## 2018-11-04 DIAGNOSIS — D631 Anemia in chronic kidney disease: Secondary | ICD-10-CM | POA: Diagnosis not present

## 2018-11-04 DIAGNOSIS — E8779 Other fluid overload: Secondary | ICD-10-CM | POA: Diagnosis not present

## 2018-11-04 DIAGNOSIS — E1129 Type 2 diabetes mellitus with other diabetic kidney complication: Secondary | ICD-10-CM | POA: Diagnosis not present

## 2018-11-04 DIAGNOSIS — D509 Iron deficiency anemia, unspecified: Secondary | ICD-10-CM | POA: Diagnosis not present

## 2018-11-04 DIAGNOSIS — N2581 Secondary hyperparathyroidism of renal origin: Secondary | ICD-10-CM | POA: Diagnosis not present

## 2018-11-04 DIAGNOSIS — N186 End stage renal disease: Secondary | ICD-10-CM | POA: Diagnosis not present

## 2018-11-05 ENCOUNTER — Telehealth: Payer: Self-pay | Admitting: Family Medicine

## 2018-11-05 DIAGNOSIS — F32A Depression, unspecified: Secondary | ICD-10-CM

## 2018-11-05 DIAGNOSIS — F329 Major depressive disorder, single episode, unspecified: Secondary | ICD-10-CM

## 2018-11-05 MED ORDER — SERTRALINE HCL 100 MG PO TABS: 100.0000 mg | ORAL_TABLET | Freq: Every day | ORAL | 3 refills | Status: DC

## 2018-11-05 NOTE — Telephone Encounter (Signed)
Pt aware RX sent 

## 2018-11-05 NOTE — Telephone Encounter (Signed)
Ok to refill, 90 with 3 refills. Thanks.

## 2018-11-05 NOTE — Telephone Encounter (Signed)
Please advise, this has not been refilled by you and she has an upcoming appointment 6/26

## 2018-11-05 NOTE — Telephone Encounter (Signed)
See request °

## 2018-11-05 NOTE — Telephone Encounter (Signed)
Copied from [facility] [id number]. Topic: Quick Communication - Rx Refill/Question >> Nov 05, 2018 11:28 AM Alanda Slim E wrote: Medication: sertraline (ZOLOFT) 100 MG tablet  Has the patient contacted their pharmacy? Call pcp   Preferred Pharmacy (with phone number or street name): [place], [place] - [address] [phone] (Phone) [phone] (Fax)    Agent: Please be advised that RX refills may take up to 3 business days. We ask that you follow-up with your pharmacy.

## 2018-11-06 DIAGNOSIS — N2581 Secondary hyperparathyroidism of renal origin: Secondary | ICD-10-CM | POA: Diagnosis not present

## 2018-11-06 DIAGNOSIS — D509 Iron deficiency anemia, unspecified: Secondary | ICD-10-CM | POA: Diagnosis not present

## 2018-11-06 DIAGNOSIS — N186 End stage renal disease: Secondary | ICD-10-CM | POA: Diagnosis not present

## 2018-11-06 DIAGNOSIS — E8779 Other fluid overload: Secondary | ICD-10-CM | POA: Diagnosis not present

## 2018-11-06 DIAGNOSIS — D631 Anemia in chronic kidney disease: Secondary | ICD-10-CM | POA: Diagnosis not present

## 2018-11-06 DIAGNOSIS — E1129 Type 2 diabetes mellitus with other diabetic kidney complication: Secondary | ICD-10-CM | POA: Diagnosis not present

## 2018-11-09 DIAGNOSIS — E1129 Type 2 diabetes mellitus with other diabetic kidney complication: Secondary | ICD-10-CM | POA: Diagnosis not present

## 2018-11-09 DIAGNOSIS — N2581 Secondary hyperparathyroidism of renal origin: Secondary | ICD-10-CM | POA: Diagnosis not present

## 2018-11-09 DIAGNOSIS — D631 Anemia in chronic kidney disease: Secondary | ICD-10-CM | POA: Diagnosis not present

## 2018-11-09 DIAGNOSIS — E8779 Other fluid overload: Secondary | ICD-10-CM | POA: Diagnosis not present

## 2018-11-09 DIAGNOSIS — D509 Iron deficiency anemia, unspecified: Secondary | ICD-10-CM | POA: Diagnosis not present

## 2018-11-09 DIAGNOSIS — N186 End stage renal disease: Secondary | ICD-10-CM | POA: Diagnosis not present

## 2018-11-10 DIAGNOSIS — N186 End stage renal disease: Secondary | ICD-10-CM | POA: Diagnosis not present

## 2018-11-10 DIAGNOSIS — D631 Anemia in chronic kidney disease: Secondary | ICD-10-CM | POA: Diagnosis not present

## 2018-11-10 DIAGNOSIS — E8779 Other fluid overload: Secondary | ICD-10-CM | POA: Diagnosis not present

## 2018-11-10 DIAGNOSIS — E1129 Type 2 diabetes mellitus with other diabetic kidney complication: Secondary | ICD-10-CM | POA: Diagnosis not present

## 2018-11-10 DIAGNOSIS — D509 Iron deficiency anemia, unspecified: Secondary | ICD-10-CM | POA: Diagnosis not present

## 2018-11-10 DIAGNOSIS — N2581 Secondary hyperparathyroidism of renal origin: Secondary | ICD-10-CM | POA: Diagnosis not present

## 2018-11-11 DIAGNOSIS — D631 Anemia in chronic kidney disease: Secondary | ICD-10-CM | POA: Diagnosis not present

## 2018-11-11 DIAGNOSIS — E1129 Type 2 diabetes mellitus with other diabetic kidney complication: Secondary | ICD-10-CM | POA: Diagnosis not present

## 2018-11-11 DIAGNOSIS — N2581 Secondary hyperparathyroidism of renal origin: Secondary | ICD-10-CM | POA: Diagnosis not present

## 2018-11-11 DIAGNOSIS — D509 Iron deficiency anemia, unspecified: Secondary | ICD-10-CM | POA: Diagnosis not present

## 2018-11-11 DIAGNOSIS — E8779 Other fluid overload: Secondary | ICD-10-CM | POA: Diagnosis not present

## 2018-11-11 DIAGNOSIS — N186 End stage renal disease: Secondary | ICD-10-CM | POA: Diagnosis not present

## 2018-11-12 ENCOUNTER — Other Ambulatory Visit: Payer: Self-pay | Admitting: Family Medicine

## 2018-11-13 DIAGNOSIS — N186 End stage renal disease: Secondary | ICD-10-CM | POA: Diagnosis not present

## 2018-11-13 DIAGNOSIS — D509 Iron deficiency anemia, unspecified: Secondary | ICD-10-CM | POA: Diagnosis not present

## 2018-11-13 DIAGNOSIS — E8779 Other fluid overload: Secondary | ICD-10-CM | POA: Diagnosis not present

## 2018-11-13 DIAGNOSIS — E1129 Type 2 diabetes mellitus with other diabetic kidney complication: Secondary | ICD-10-CM | POA: Diagnosis not present

## 2018-11-13 DIAGNOSIS — D631 Anemia in chronic kidney disease: Secondary | ICD-10-CM | POA: Diagnosis not present

## 2018-11-13 DIAGNOSIS — N2581 Secondary hyperparathyroidism of renal origin: Secondary | ICD-10-CM | POA: Diagnosis not present

## 2018-11-15 ENCOUNTER — Ambulatory Visit: Payer: Medicare Other | Admitting: Physical Therapy

## 2018-11-15 DIAGNOSIS — Z992 Dependence on renal dialysis: Secondary | ICD-10-CM | POA: Diagnosis not present

## 2018-11-15 DIAGNOSIS — T82858A Stenosis of vascular prosthetic devices, implants and grafts, initial encounter: Secondary | ICD-10-CM | POA: Diagnosis not present

## 2018-11-15 DIAGNOSIS — E1129 Type 2 diabetes mellitus with other diabetic kidney complication: Secondary | ICD-10-CM | POA: Diagnosis not present

## 2018-11-15 DIAGNOSIS — I871 Compression of vein: Secondary | ICD-10-CM | POA: Diagnosis not present

## 2018-11-15 DIAGNOSIS — N186 End stage renal disease: Secondary | ICD-10-CM | POA: Diagnosis not present

## 2018-11-16 DIAGNOSIS — E1129 Type 2 diabetes mellitus with other diabetic kidney complication: Secondary | ICD-10-CM | POA: Diagnosis not present

## 2018-11-16 DIAGNOSIS — N186 End stage renal disease: Secondary | ICD-10-CM | POA: Diagnosis not present

## 2018-11-16 DIAGNOSIS — D631 Anemia in chronic kidney disease: Secondary | ICD-10-CM | POA: Diagnosis not present

## 2018-11-16 DIAGNOSIS — D509 Iron deficiency anemia, unspecified: Secondary | ICD-10-CM | POA: Diagnosis not present

## 2018-11-16 DIAGNOSIS — N2581 Secondary hyperparathyroidism of renal origin: Secondary | ICD-10-CM | POA: Diagnosis not present

## 2018-11-18 DIAGNOSIS — E1129 Type 2 diabetes mellitus with other diabetic kidney complication: Secondary | ICD-10-CM | POA: Diagnosis not present

## 2018-11-18 DIAGNOSIS — D631 Anemia in chronic kidney disease: Secondary | ICD-10-CM | POA: Diagnosis not present

## 2018-11-18 DIAGNOSIS — N186 End stage renal disease: Secondary | ICD-10-CM | POA: Diagnosis not present

## 2018-11-18 DIAGNOSIS — D509 Iron deficiency anemia, unspecified: Secondary | ICD-10-CM | POA: Diagnosis not present

## 2018-11-18 DIAGNOSIS — N2581 Secondary hyperparathyroidism of renal origin: Secondary | ICD-10-CM | POA: Diagnosis not present

## 2018-11-20 DIAGNOSIS — D631 Anemia in chronic kidney disease: Secondary | ICD-10-CM | POA: Diagnosis not present

## 2018-11-20 DIAGNOSIS — N186 End stage renal disease: Secondary | ICD-10-CM | POA: Diagnosis not present

## 2018-11-20 DIAGNOSIS — N2581 Secondary hyperparathyroidism of renal origin: Secondary | ICD-10-CM | POA: Diagnosis not present

## 2018-11-20 DIAGNOSIS — D509 Iron deficiency anemia, unspecified: Secondary | ICD-10-CM | POA: Diagnosis not present

## 2018-11-20 DIAGNOSIS — E1129 Type 2 diabetes mellitus with other diabetic kidney complication: Secondary | ICD-10-CM | POA: Diagnosis not present

## 2018-11-23 DIAGNOSIS — N186 End stage renal disease: Secondary | ICD-10-CM | POA: Diagnosis not present

## 2018-11-23 DIAGNOSIS — N2581 Secondary hyperparathyroidism of renal origin: Secondary | ICD-10-CM | POA: Diagnosis not present

## 2018-11-23 DIAGNOSIS — E1129 Type 2 diabetes mellitus with other diabetic kidney complication: Secondary | ICD-10-CM | POA: Diagnosis not present

## 2018-11-23 DIAGNOSIS — D631 Anemia in chronic kidney disease: Secondary | ICD-10-CM | POA: Diagnosis not present

## 2018-11-23 DIAGNOSIS — D509 Iron deficiency anemia, unspecified: Secondary | ICD-10-CM | POA: Diagnosis not present

## 2018-11-27 ENCOUNTER — Other Ambulatory Visit: Payer: Self-pay

## 2018-11-27 ENCOUNTER — Emergency Department (HOSPITAL_COMMUNITY)
Admission: EM | Admit: 2018-11-27 | Discharge: 2018-11-27 | Disposition: A | Discharge status: Home / Self Care | Payer: Medicare Other | Attending: Emergency Medicine | Admitting: Emergency Medicine

## 2018-11-27 ENCOUNTER — Encounter (HOSPITAL_COMMUNITY): Payer: Self-pay | Admitting: Emergency Medicine

## 2018-11-27 ENCOUNTER — Emergency Department (HOSPITAL_COMMUNITY): Payer: Medicare Other

## 2018-11-27 DIAGNOSIS — Z794 Long term (current) use of insulin: Secondary | ICD-10-CM | POA: Diagnosis not present

## 2018-11-27 DIAGNOSIS — Z96652 Presence of left artificial knee joint: Secondary | ICD-10-CM | POA: Insufficient documentation

## 2018-11-27 DIAGNOSIS — R071 Chest pain on breathing: Secondary | ICD-10-CM | POA: Insufficient documentation

## 2018-11-27 DIAGNOSIS — N185 Chronic kidney disease, stage 5: Secondary | ICD-10-CM | POA: Diagnosis not present

## 2018-11-27 DIAGNOSIS — E1140 Type 2 diabetes mellitus with diabetic neuropathy, unspecified: Secondary | ICD-10-CM | POA: Insufficient documentation

## 2018-11-27 DIAGNOSIS — I132 Hypertensive heart and chronic kidney disease with heart failure and with stage 5 chronic kidney disease, or end stage renal disease: Secondary | ICD-10-CM | POA: Insufficient documentation

## 2018-11-27 DIAGNOSIS — Z79899 Other long term (current) drug therapy: Secondary | ICD-10-CM | POA: Diagnosis not present

## 2018-11-27 DIAGNOSIS — Z7982 Long term (current) use of aspirin: Secondary | ICD-10-CM | POA: Insufficient documentation

## 2018-11-27 DIAGNOSIS — R531 Weakness: Secondary | ICD-10-CM | POA: Insufficient documentation

## 2018-11-27 DIAGNOSIS — R079 Chest pain, unspecified: Secondary | ICD-10-CM | POA: Diagnosis not present

## 2018-11-27 DIAGNOSIS — E1122 Type 2 diabetes mellitus with diabetic chronic kidney disease: Secondary | ICD-10-CM | POA: Diagnosis not present

## 2018-11-27 DIAGNOSIS — F17210 Nicotine dependence, cigarettes, uncomplicated: Secondary | ICD-10-CM | POA: Diagnosis not present

## 2018-11-27 DIAGNOSIS — R0602 Shortness of breath: Secondary | ICD-10-CM | POA: Insufficient documentation

## 2018-11-27 DIAGNOSIS — I5032 Chronic diastolic (congestive) heart failure: Secondary | ICD-10-CM | POA: Diagnosis not present

## 2018-11-27 DIAGNOSIS — R0789 Other chest pain: Secondary | ICD-10-CM

## 2018-11-27 LAB — BASIC METABOLIC PANEL
Anion gap: 21 — ABNORMAL HIGH (ref 5–15)
BUN: 86 mg/dL — ABNORMAL HIGH (ref 8–23)
CO2: 24 mmol/L (ref 22–32)
Calcium: 7.5 mg/dL — ABNORMAL LOW (ref 8.9–10.3)
Chloride: 91 mmol/L — ABNORMAL LOW (ref 98–111)
Creatinine, Ser: 8.24 mg/dL — ABNORMAL HIGH (ref 0.44–1.00)
GFR calc Af Amer: 5 mL/min — ABNORMAL LOW (ref >60)
GFR calc non Af Amer: 5 mL/min — ABNORMAL LOW (ref >60)
Glucose, Bld: 174 mg/dL — ABNORMAL HIGH (ref 70–99)
Potassium: 3.8 mmol/L (ref 3.5–5.1)
Sodium: 136 mmol/L (ref 135–145)

## 2018-11-27 LAB — CBC
HCT: 22.1 % — ABNORMAL LOW (ref 36.0–46.0)
Hemoglobin: 7.1 g/dL — ABNORMAL LOW (ref 12.0–15.0)
MCH: 31.1 pg (ref 26.0–34.0)
MCHC: 32.1 g/dL (ref 30.0–36.0)
MCV: 96.9 fL (ref 80.0–100.0)
Platelets: 178 10*3/uL (ref 150–400)
RBC: 2.28 MIL/uL — ABNORMAL LOW (ref 3.87–5.11)
RDW: 15.2 % (ref 11.5–15.5)
WBC: 7.8 10*3/uL (ref 4.0–10.5)
nRBC: 0 % (ref 0.0–0.2)

## 2018-11-27 LAB — MAGNESIUM: Magnesium: 2.6 mg/dL — ABNORMAL HIGH (ref 1.7–2.4)

## 2018-11-27 LAB — BRAIN NATRIURETIC PEPTIDE: B Natriuretic Peptide: 638.8 pg/mL — ABNORMAL HIGH (ref 0.0–100.0)

## 2018-11-27 LAB — TROPONIN I
Troponin I: 0.06 ng/mL (ref <0.03)
Troponin I: 0.07 ng/mL (ref <0.03)

## 2018-11-27 LAB — OCCULT BLOOD, POC DEVICE: Fecal Occult Bld: POSITIVE — AB

## 2018-11-27 NOTE — ED Triage Notes (Signed)
Pt c/o chest pains for past couple days that is worse with taking deep breaths and walking. Reports sometimes has hard time catching her breath.

## 2018-11-27 NOTE — Discharge Instructions (Addendum)
You were evaluated in the emergency department for chest pain.    Work up here was reassuring.  You need to call cardiology as soon as possible to establish care and further discussion and work up of your symptoms on an outpatient setting You had blood in your stool but denies history of GI bleed, or black stools. This needs to be retested by your primary care doctor. Return to ER for persistent weakness, black stools, abdominal pain  Go to your dialysis center immediately after ER for treatment  Please return to ED if: Your chest pain is worse or on exertion You have a cough that gets worse, or you cough up blood. You have severe pain in chest, back or abdomen. You have chest pain or shortness of breath with exertion or activity You have sudden, unexplained discomfort in your chest, with radiation arms, back, neck, or jaw. You suddenly have chest pain and begin to sweat, or your skin gets clammy. You feel chest pain with nausea or vomiting. You suddenly feel light-headed or faint. Your heart begins to beat quickly, or it feels like it is skipping beats. You have one sided leg swelling or calf pain

## 2018-11-27 NOTE — ED Notes (Signed)
ED Provider at bedside. 

## 2018-11-27 NOTE — ED Notes (Signed)
Date and time results received: 11/27/18 1144 (use smartphrase ".now" to insert current time)  Test: troponin Critical Value: 0.06  Name of Provider Notified: Dr. Maryan Rued  Orders Received? Or Actions Taken?:

## 2018-11-27 NOTE — ED Provider Notes (Signed)
[facility] DEPT Provider Note   CSN: [id number] Arrival date & time: 11/27/18  0947    History   Chief Complaint Chief Complaint  Patient presents with  . chest pain    HPI Brandy Houston is a 63 y.o. female with history of anemia, HTN, DM on insulin, HLD, ESRD on HD Tuesday, Thursday, Saturday, left AKA, tobacco use presents to the ER for evaluation of chest pain.  Onset 3 days ago.  It is left-sided, nonradiating, nonexertional.  Described as sharp, mild.  It is worse with taking deep breaths, moving and changing positions.  No interventions.  No alleviating factors.  Initially the pain was intermittent but she woke up this morning at 6 AM and it has been constant since.  For the last 3 days she has had associated generalized weakness, decreased energy, shortness of breath with exertion.  Has chronic cough "clearing my throat" that is unchanged.  Last dialysis was on Tuesday, she did not feel well enough to go on Thursday. No interventions.   She denies any frank fever, chills, rhinorrhea, congestion, sore throat, nausea, vomiting, dysuria, hematuria, changes to urine output.  Reports she always has lower abdominal pain and diarrhea since starting dialysis, unchanged.  No history of DVT, PE, estrogen use, recent prolonged immobilization, surgeries, cancer or hemoptysis.  No travel.  No exposure to COVID.     HPI  Past Medical History:  Diagnosis Date  . Anemia   . Anxiety   . Arthritis    "in my joints" (03/10/2017)  . Chronic diastolic CHF (congestive heart failure) ([place])   . CKD (chronic kidney disease), stage IV ([place])    stage IV. previous HD, none currently 10/30/15 (confirmed 02/05/2017 & 03/10/2017)  . Depression    Chronic  . Diabetic peripheral neuropathy ([place])   . Dysrhythmia    tachycardia, normal ECHO 08-09-14  . GERD (gastroesophageal reflux disease)   . Heart murmur     dx'd 02/05/2017  . Hepatitis C    "tx'd in 2016; I'm negative now"  (02/05/2017)  . History of blood transfusion 2017   "w/knee replacement"  . Hypertension   . Osteoarthritis of left knee   . Peripheral neuropathy   . Protein calorie malnutrition ([place])   . Septic arthritis ([place])   . Slow transit constipation   . Type II diabetes mellitus ([name])    IDDM  . Uncontrolled hypertension 02/18/2013  . Unsteady gait     Patient Active Problem List   Diagnosis Date Noted  . Benign essential HTN   . Stage 5 chronic kidney disease not on chronic dialysis ([place])   . Above knee amputation status, left   . DM type 2 causing CKD stage 5 ([place]) 03/21/2017  . Anemia of chronic disease 02/05/2017  . Dysplasia of cervix, high grade CIN 2 01/12/2017  . Chronic diastolic CHF (congestive heart failure) ([place]) 11/08/2015  . Total knee replacement status   . Diabetic peripheral neuropathy ([place])   . Major depression, recurrent, chronic ([place])   . Chronic kidney disease (CKD), stage IV (severe) ([place])   . Hepatitis C 08/16/2014  . Peripheral neuropathy 06/24/2013  . Compulsive tobacco user syndrome 06/24/2013    Past Surgical History:  Procedure Laterality Date  . A/V FISTULAGRAM Right 07/07/2018   Procedure: A/V FISTULAGRAM;  Surgeon: Marty Heck, MD;  Location: [facility];  Service: Cardiovascular;  Laterality: Right;  . AMPUTATION Left 03/20/2017   Procedure: LEFT ABOVE KNEE AMPUTATION;  Surgeon: Newt Minion, MD;  Location: [facility];  Service: Orthopedics;  Laterality: Left;  . APPENDECTOMY    . AV FISTULA PLACEMENT Left 09/13/2014   Procedure: Brachial Artery to Brachial Vein Gortex Four - Seven Stretch GRAFT INSERTION Left Forearm;  Surgeon: Mal Misty, MD;  Location: [facility];  Service: Vascular;  Laterality: Left;  . BASCILIC VEIN TRANSPOSITION Right 10/26/2017   Procedure: BASILIC VEIN TRANSPOSITION FIRST STAGE RIGHT ARM;  Surgeon: Conrad Rentz, MD;  Location: [facility];  Service: Vascular;  Laterality: Right;  . BASCILIC VEIN TRANSPOSITION Right  02/01/2018   Procedure: SECOND STAGE BASILIC VEIN TRANSPOSITION RIGHT UPPER EXTREMITY;  Surgeon: Marty Heck, MD;  Location: [facility];  Service: Vascular;  Laterality: Right;  . CHOLECYSTECTOMY OPEN    . COLON SURGERY    . EXCISIONAL TOTAL KNEE ARTHROPLASTY WITH ANTIBIOTIC SPACERS Left 02/06/2017   Procedure: Incisional total iknee with antibiotic spacer ;  Surgeon: Leandrew Koyanagi, MD;  Location: [facility];  Service: Orthopedics;  Laterality: Left;  . IR FLUORO GUIDE CV LINE RIGHT  02/10/2017  . IR REMOVAL TUN CV CATH W/O FL  04/01/2017  . IR US GUIDE VASC ACCESS RIGHT  02/10/2017  . IRRIGATION AND DEBRIDEMENT KNEE Left 03/12/2017   Procedure: IRRIGATION AND DEBRIDEMENT LEFT KNEE WITH WOUND VAC APPLICATION;  Surgeon: Leandrew Koyanagi, MD;  Location: [name];  Service: Orthopedics;  Laterality: Left;  . JOINT REPLACEMENT    . KNEE ARTHROSCOPY Right 08/10/2014   Procedure: ARTHROSCOPY I & D KNEE;  Surgeon: Marianna Payment, MD;  Location: [facility];  Service: Orthopedics;  Laterality: Right;  . KNEE ARTHROSCOPY Left 08/11/2014   Procedure: ARTHROSCOPIC WASHOUT LEFT KNEE;  Surgeon: Marianna Payment, MD;  Location: [facility];  Service: Orthopedics;  Laterality: Left;  . KNEE ARTHROSCOPY Left 08/19/2014   Procedure: ARTHROSCOPIC WASHOUT LEFT KNEE;  Surgeon: Leandrew Koyanagi, MD;  Location: [facility];  Service: Orthopedics;  Laterality: Left;  . KNEE ARTHROSCOPY WITH LATERAL MENISECTOMY Left 04/04/2015   Procedure: AND PARTIAL LATERAL MENISECTOMY;  Surgeon: Leandrew Koyanagi, MD;  Location: [facility];  Service: Orthopedics;  Laterality: Left;  . KNEE ARTHROSCOPY WITH MEDIAL MENISECTOMY Left 04/04/2015   Procedure: LEFT KNEE ARTHROSCOPY WITH PARTIAL MEDIAL MENISCECTOMY  AND SYNOVECTOMY;  Surgeon: Leandrew Koyanagi, MD;  Location: [facility];  Service: Orthopedics;  Laterality: Left;  . PERIPHERAL VASCULAR BALLOON ANGIOPLASTY  07/07/2018   Procedure: PERIPHERAL VASCULAR BALLOON ANGIOPLASTY;  Surgeon:  Marty Heck, MD;  Location: [facility];  Service: Cardiovascular;;  right AV fistula  . SHOULDER ARTHROSCOPY Bilateral 08/10/2014   Procedure: I & D BILATERAL SHOULDERS ;  Surgeon: Marianna Payment, MD;  Location: [facility];  Service: Orthopedics;  Laterality: Bilateral;  . SMALL INTESTINE SURGERY     Due to Small Bowel Obstruction; "fixed it when they did my gallbladder OR"  . TEE WITHOUT CARDIOVERSION N/A 08/14/2014   Procedure: TRANSESOPHAGEAL ECHOCARDIOGRAM (TEE);  Surgeon: Thayer Headings, MD;  Location: [facility];  Service: Cardiovascular;  Laterality: N/A;  . TENOSYNOVECTOMY Right 08/11/2014   Procedure: RIGHT WRIST IRRIGATION AND DEBRIDEMENT, TENOSYNOVECTOMY;  Surgeon: Marianna Payment, MD;  Location: [facility];  Service: Orthopedics;  Laterality: Right;  . TOTAL KNEE ARTHROPLASTY Left 11/07/2015   Procedure: LEFT TOTAL KNEE ARTHROPLASTY WITH REVISION OF IMPLANTS;  Surgeon: Leandrew Koyanagi, MD;  Location: [facility];  Service: Orthopedics;  Laterality: Left;  . TUBAL LIGATION  OB History    Gravida  5   Para  0   Term  0   Preterm      AB  2   Living  3     SAB  1   TAB  1   Ectopic      Multiple      Live Births               Home Medications    Prior to Admission medications   Medication Sig Start Date End Date Taking? Authorizing Provider  amLODipine (NORVASC) 10 MG tablet Take 1 tablet (10 mg total) by mouth at bedtime. 03/31/17  Yes Angiulli, Lavon Paganini, PA-C  aspirin EC 81 MG tablet Take 81 mg by mouth daily.   Yes [provider]  atorvastatin (LIPITOR) 10 MG tablet TAKE 1 TABLET BY MOUTH AT BEDTIME 11/12/18  Yes [name], MD  Chlorpheniramine Maleate (ALLERGY PO) Take 1 tablet by mouth daily as needed (allergies).   Yes [provider]  cloNIDine (CATAPRES) 0.2 MG tablet Take 0.2 mg by mouth 2 (two) times daily.   Yes [provider]  EQ CLEARLAX powder Take 0.5 Containers by mouth daily as needed for mild  constipation.  06/03/18  Yes [provider]  gabapentin (NEURONTIN) 100 MG capsule Take 200 mg by mouth 3 (three) times daily. 11/01/18  Yes [provider]  hydrALAZINE (APRESOLINE) 100 MG tablet Take 100 mg by mouth as directed. Take 1 tablet TID ONLY ON (SUN MON, WED, FRI)   Yes [provider]  Insulin Glargine (LANTUS SOLOSTAR) 100 UNIT/ML Solostar Pen Inject 12 Units into the skin daily at 10 pm. Patient taking differently: Inject 15 Units into the skin daily.  09/20/18  Yes [name], MD  omeprazole (PRILOSEC) 20 MG capsule Take 20 mg by mouth daily before breakfast.  10/04/15  Yes [provider]  polyethylene glycol (MIRALAX / GLYCOLAX) packet Take 17 g by mouth 2 (two) times daily. Patient taking differently: Take 17 g by mouth daily as needed for moderate constipation.  03/31/17  Yes Angiulli, Lavon Paganini, PA-C  sertraline (ZOLOFT) 100 MG tablet Take 1 tablet (100 mg total) by mouth daily. 11/05/18  Yes [name], MD  sevelamer carbonate (RENVELA) 800 MG tablet Take 1,600 mg by mouth 3 (three) times daily with meals.   Yes [provider]  traZODone (DESYREL) 50 MG tablet Take 50 mg by mouth at bedtime. 10/27/18  Yes [provider]  Accu-Chek Softclix Lancets lancets Check sugars three times a day for E11.65 09/20/18   [name], MD  Blood Glucose Monitoring Suppl (ACCU-CHEK AVIVA PLUS) W/DEVICE KIT Check sugars TID for E11.65 01/29/15   Lance Bosch, NP  gabapentin (NEURONTIN) 300 MG capsule TAKE 1 CAPSULE BY MOUTH THREE TIMES DAILY Patient not taking: Reported on 11/27/2018 08/13/18   Newt Minion, MD  glucose blood (ACCU-CHEK AVIVA) test strip Check sugars TID for E11.65 07/21/18   [name], MD  Lancet Devices [facility]) lancets Check sugars TID for E11.65 01/29/15   Chari Manning A, NP  lidocaine-prilocaine (EMLA) cream Apply 1 application topically once.  10/25/18   [provider]  methocarbamol  (ROBAXIN) 500 MG tablet TAKE 1 TABLET BY MOUTH EVERY 6 HOURS AS NEEDED FOR MUSCLE SPASM Patient not taking: No sig reported 06/28/18   Leandrew Koyanagi, MD    Family History Family History  Problem Relation Age of Onset  .  Cancer Mother   . Heart disease Father   . Cancer Sister   . Hypertension Sister   . Cancer Brother   . Diabetes Brother   . Hypertension Brother     Social History Social History   Tobacco Use  . Smoking status: Current Every Day Smoker    Packs/day: 0.50    Years: 35.00    Pack years: 17.50    Types: Cigarettes  . Smokeless tobacco: Never Used  Substance Use Topics  . Alcohol use: Not Currently    Alcohol/week: 0.0 standard drinks    Comment: 03/10/2017 "I drink a wine cooler a few times/year"  . Drug use: No     Allergies   Compazine [prochlorperazine], Shellfish-derived products, Iodinated diagnostic agents, Omnipaque [iohexol], Tape, and Sulfa antibiotics   Review of Systems Review of Systems  Constitutional: Positive for fatigue.  Cardiovascular: Positive for chest pain.  Neurological: Positive for weakness (Generalized).  All other systems reviewed and are negative.    Physical Exam Updated Vital Signs BP (!) 135/95   Pulse 94   Temp 98.3 F (36.8 C) (Oral)   Resp 15   LMP 03/16/2002   SpO2 98%   Physical Exam Constitutional:      Appearance: She is well-developed.     Comments: NAD. Non toxic.   HENT:     Head: Normocephalic and atraumatic.     Nose: Nose normal.  Eyes:     General: Lids are normal.     Conjunctiva/sclera: Conjunctivae normal.  Neck:     Musculoskeletal: Normal range of motion.     Trachea: Trachea normal.     Comments: Trachea midline.  Cardiovascular:     Rate and Rhythm: Normal rate and regular rhythm.     Pulses:          Radial pulses are 1+ on the right side and 1+ on the left side.     Heart sounds: S1 normal and S2 normal. Murmur (Systolic) present.     Comments: S/p left AKA.  No R lower  extremity edema, calf tenderness.  1+ R DP pulse.  1+ L femoral pulse.  1+ radial pulses bilaterally.  Palpable thrill to the right upper extremity fistula. Pulmonary:     Effort: Pulmonary effort is normal.     Breath sounds: Normal breath sounds.     Comments: Normal work of breathing.  Speaking in full sentences.  No wheezing.  No crackles.  No chest wall tenderness. Abdominal:     General: Bowel sounds are normal.     Palpations: Abdomen is soft.     Tenderness: There is no abdominal tenderness.     Comments: No epigastric tenderness. No distention.   Skin:    General: Skin is warm and dry.     Capillary Refill: Capillary refill takes less than 2 seconds.     Comments: No rash to chest wall  Neurological:     Mental Status: She is alert.     GCS: GCS eye subscore is 4. GCS verbal subscore is 5. GCS motor subscore is 6.  Psychiatric:        Speech: Speech normal.        Behavior: Behavior normal.        Thought Content: Thought content normal.      ED Treatments / Results  Labs (all labs ordered are listed, but only abnormal results are displayed) Labs Reviewed  BASIC METABOLIC PANEL - Abnormal; Notable for the following components:  Result Value   Chloride 91 (*)    Glucose, Bld 174 (*)    BUN 86 (*)    Creatinine, Ser 8.24 (*)    Calcium 7.5 (*)    GFR calc non Af Amer 5 (*)    GFR calc Af Amer 5 (*)    Anion gap 21 (*)    All other components within normal limits  CBC - Abnormal; Notable for the following components:   RBC 2.28 (*)    Hemoglobin 7.1 (*)    HCT 22.1 (*)    All other components within normal limits  TROPONIN I - Abnormal; Notable for the following components:   Troponin I 0.06 (*)    All other components within normal limits  MAGNESIUM - Abnormal; Notable for the following components:   Magnesium 2.6 (*)    All other components within normal limits  BRAIN NATRIURETIC PEPTIDE - Abnormal; Notable for the following components:   B Natriuretic  Peptide 638.8 (*)    All other components within normal limits  TROPONIN I - Abnormal; Notable for the following components:   Troponin I 0.07 (*)    All other components within normal limits  OCCULT BLOOD, POC DEVICE - Abnormal; Notable for the following components:   Fecal Occult Bld POSITIVE (*)    All other components within normal limits  POC OCCULT BLOOD, ED    EKG EKG Interpretation  Date/Time:  Saturday November 27 2018 09:55:32 EDT Ventricular Rate:  83 PR Interval:    QRS Duration: 104 QT Interval:  434 QTC Calculation: 510 R Axis:   38 Text Interpretation:  Sinus rhythm Repol abnrm suggests ischemia, lateral leads Prolonged QT interval Baseline wander in lead(s) V3 No significant change since last tracing Confirmed by Blanchie Dessert [phone]) on 11/27/2018 10:13:28 AM   Radiology Dg Chest 2 View  Result Date: 11/27/2018 CLINICAL DATA:  Pleuritic chest pain and dyspnea for several days. EXAM: CHEST - 2 VIEW COMPARISON:  None. FINDINGS: Stable mild cardiomegaly. Aortic atherosclerosis. Both lungs are clear. The visualized skeletal structures are unremarkable. IMPRESSION: Stable mild cardiomegaly. No active lung disease. Electronically Signed   By: Earle Gell M.D.   On: 11/27/2018 11:28    Procedures Procedures (including critical care time)  Medications Ordered in ED Medications - No data to display   Initial Impression / Assessment and Plan / ED Course  I have reviewed the triage vital signs and the nursing notes.  Pertinent labs & imaging results that were available during my care of the patient were reviewed by me and considered in my medical decision making (see chart for details).  Clinical Course as of Nov 26 1498  Sat Nov 27, 2018  1055 No changes. Prolonged Qtc 510   EKG 12-Lead [CG]  1131 IMPRESSION: Stable mild cardiomegaly. No active lung disease.  DG Chest 2 View [CG]  1239 Ordered in error. In setting of ESRD and missing dialysis. She does not look  significantly fluid overloaded, no hypoxemia.   B Natriuretic Peptide(!): 638.8 [CG]  1239 Creatinine(!): 8.24 [CG]  1239 BUN(!): 86 [CG]  1239 Could be related to hyperglycemia and missing dialysis   Anion gap(!): 21 [CG]  1239 Collected by RN and not myself. RN saw scant light brown stool, no melena. No hemorrhoids. No signs of external bleeding on his exam. Pt takes binders and iron, reports stools sometimes black and not lately.  Fecal Occult Blood, POC(!): POSITIVE [CG]  1239 Troponin I(!!): 0.06 [CG]  1440  Patient has called her dialysis office, she will be able to be dialyzed today after leaving from ER. Pending trop before discharge.    [CG]  1444 Repeated EKG. Pt continues to have CP.   Troponin I(!!): 0.07 [CG]    Clinical Course User Index [CG] Kinnie Feil, PA-C      63 year old is here with left-sided chest pain.  Has some atypical components to it, constant since 0600 today, worse with movement, breathing.  Associated with exertional shortness of breath, generalized weakness and fatigue.  However she has multiple risk factors for cardiac etiology including hypertension, diabetes, hyperlipidemia, tobacco use.   This is in setting of missing dialysis Thursday and today.   Exam is reassuring.  No signs of overt hypervolemia.  No fever, tachycardia, tachypnea, hypoxemia.  No respiratory distress. S/p L AKA, no signs of R DVT.   Work up remarkable for Hgb 7.1, last 12.1 on 07/2018 although her baseline is closer to 8-9. Hemocult positive collected by RN, light brown and no other source of external bleeding. Creatinine/BUN and AG reflective of missing dialysis, K normal. Trop 0.06, elevated previously.   Final Clinical Impressions(s) / ED Diagnoses   1455: Trop 0.06 > 0.07. She continues to have CP. Repeat EKG with ST depressions in lateral leads as seen previously. Discussed with EDP. Will discharge with cardiology f/u.  Pt aware her hemoccult was positive today but  hemoglobin is stable. She was instructed to monitor fr melena and f/u with PCP for this. She has called her dialysis center and they will dialyze her today.  Final diagnoses:  Atypical chest pain    ED Discharge Orders    None       Arlean Hopping 11/27/18 1501    Blanchie Dessert, MD 11/28/18 [phone]

## 2018-11-29 ENCOUNTER — Ambulatory Visit: Payer: Medicare Other | Attending: Orthopedic Surgery | Admitting: Physical Therapy

## 2018-11-29 ENCOUNTER — Encounter: Payer: Self-pay | Admitting: Physical Therapy

## 2018-11-29 ENCOUNTER — Other Ambulatory Visit: Payer: Self-pay

## 2018-11-29 VITALS — BP 132/55 | HR 95

## 2018-11-29 DIAGNOSIS — Z9181 History of falling: Secondary | ICD-10-CM | POA: Insufficient documentation

## 2018-11-29 DIAGNOSIS — M79604 Pain in right leg: Secondary | ICD-10-CM | POA: Diagnosis not present

## 2018-11-29 DIAGNOSIS — N2581 Secondary hyperparathyroidism of renal origin: Secondary | ICD-10-CM | POA: Diagnosis not present

## 2018-11-29 DIAGNOSIS — R2681 Unsteadiness on feet: Secondary | ICD-10-CM | POA: Diagnosis not present

## 2018-11-29 DIAGNOSIS — M6281 Muscle weakness (generalized): Secondary | ICD-10-CM | POA: Diagnosis not present

## 2018-11-29 DIAGNOSIS — R293 Abnormal posture: Secondary | ICD-10-CM | POA: Insufficient documentation

## 2018-11-29 DIAGNOSIS — E1129 Type 2 diabetes mellitus with other diabetic kidney complication: Secondary | ICD-10-CM | POA: Diagnosis not present

## 2018-11-29 DIAGNOSIS — N186 End stage renal disease: Secondary | ICD-10-CM | POA: Diagnosis not present

## 2018-11-29 DIAGNOSIS — D509 Iron deficiency anemia, unspecified: Secondary | ICD-10-CM | POA: Diagnosis not present

## 2018-11-29 DIAGNOSIS — M79605 Pain in left leg: Secondary | ICD-10-CM | POA: Insufficient documentation

## 2018-11-29 DIAGNOSIS — D631 Anemia in chronic kidney disease: Secondary | ICD-10-CM | POA: Diagnosis not present

## 2018-11-29 DIAGNOSIS — R2689 Other abnormalities of gait and mobility: Secondary | ICD-10-CM | POA: Diagnosis not present

## 2018-11-29 DIAGNOSIS — M6249 Contracture of muscle, multiple sites: Secondary | ICD-10-CM | POA: Diagnosis not present

## 2018-11-29 NOTE — Therapy (Signed)
[facility] [address] [place] [place], [place], [address] Phone: [phone]   Fax:  [phone]  Physical Therapy Treatment, Reassessment & Progress Note  Patient Details  Name: Brandy Houston MRN: [id number] Date of Birth: 01-Nov-1955 Referring Provider (PT): Meridee Score, MD   Encounter Date: 11/29/2018   CLINIC OPERATION CHANGES: [facility]patient [facility] is open at lower capacity following universal masking, social distancing, and patient screening.  [name] COVID risk of complications score is 5.   PT End of Session - 11/29/18 2132    Visit Number  19   progress note /recert done at visit 19   Number of Visits  40    Date for PT Re-Evaluation  02/25/19    Authorization Type  Medicare & Medicaid    PT Start Time  1005    PT Stop Time  1038    PT Time Calculation (min)  33 min    Equipment Utilized During Treatment  Gait belt    Activity Tolerance  Patient tolerated treatment well;No increased pain;Patient limited by fatigue    Behavior During Therapy  [facility] for tasks assessed/performed       Past Medical History:  Diagnosis Date  . Anemia   . Anxiety   . Arthritis    "in my joints" (03/10/2017)  . Chronic diastolic CHF (congestive heart failure) ([place])   . CKD (chronic kidney disease), stage IV ([place])    stage IV. previous HD, none currently 10/30/15 (confirmed 02/05/2017 & 03/10/2017)  . Depression    Chronic  . Diabetic peripheral neuropathy ([place])   . Dysrhythmia    tachycardia, normal ECHO 08-09-14  . GERD (gastroesophageal reflux disease)   . Heart murmur     dx'd 02/05/2017  . Hepatitis C    "tx'd in 2016; I'm negative now" (02/05/2017)  . History of blood transfusion 2017   "w/knee replacement"  . Hypertension   . Osteoarthritis of left knee   . Peripheral neuropathy   . Protein calorie malnutrition ([place])   . Septic arthritis ([place])   . Slow transit constipation   . Type II diabetes mellitus ([name])    IDDM  . Uncontrolled hypertension 02/18/2013  . Unsteady gait     Past Surgical History:  Procedure Laterality Date  . A/V FISTULAGRAM Right 07/07/2018   Procedure: A/V FISTULAGRAM;  Surgeon: Marty Heck, MD;  Location: [facility];  Service: Cardiovascular;  Laterality: Right;  . AMPUTATION Left 03/20/2017   Procedure: LEFT ABOVE KNEE AMPUTATION;  Surgeon: Newt Minion, MD;  Location: [facility];  Service: Orthopedics;  Laterality: Left;  . APPENDECTOMY    . AV FISTULA PLACEMENT Left 09/13/2014   Procedure: Brachial Artery to Brachial Vein Gortex Four - Seven Stretch GRAFT INSERTION Left Forearm;  Surgeon: Mal Misty, MD;  Location: [facility];  Service: Vascular;  Laterality: Left;  . BASCILIC VEIN TRANSPOSITION Right 10/26/2017   Procedure: BASILIC VEIN TRANSPOSITION FIRST STAGE RIGHT ARM;  Surgeon: Conrad [place], MD;  Location: [facility];  Service: Vascular;  Laterality: Right;  . BASCILIC VEIN TRANSPOSITION Right 02/01/2018   Procedure: SECOND STAGE BASILIC VEIN TRANSPOSITION RIGHT UPPER EXTREMITY;  Surgeon: Marty Heck, MD;  Location: [facility];  Service: Vascular;  Laterality: Right;  . CHOLECYSTECTOMY OPEN    . COLON SURGERY    . EXCISIONAL TOTAL KNEE ARTHROPLASTY WITH ANTIBIOTIC SPACERS Left 02/06/2017   Procedure: Incisional total iknee with antibiotic spacer ;  Surgeon: Leandrew Koyanagi, MD;  Location: [facility];  Service: Orthopedics;  Laterality: Left;  . IR FLUORO GUIDE CV LINE RIGHT  02/10/2017  . IR REMOVAL TUN CV CATH W/O FL  04/01/2017  . IR US GUIDE VASC ACCESS RIGHT  02/10/2017  . IRRIGATION AND DEBRIDEMENT KNEE Left 03/12/2017   Procedure: IRRIGATION AND DEBRIDEMENT LEFT KNEE WITH WOUND VAC APPLICATION;  Surgeon: Leandrew Koyanagi, MD;  Location: [facility];  Service: Orthopedics;  Laterality: Left;  . JOINT REPLACEMENT    . KNEE ARTHROSCOPY Right 08/10/2014   Procedure: ARTHROSCOPY I & D KNEE;  Surgeon: Marianna Payment, MD;  Location: [facility];  Service: Orthopedics;  Laterality:  Right;  . KNEE ARTHROSCOPY Left 08/11/2014   Procedure: ARTHROSCOPIC WASHOUT LEFT KNEE;  Surgeon: Marianna Payment, MD;  Location: [facility];  Service: Orthopedics;  Laterality: Left;  . KNEE ARTHROSCOPY Left 08/19/2014   Procedure: ARTHROSCOPIC WASHOUT LEFT KNEE;  Surgeon: Leandrew Koyanagi, MD;  Location: [facility];  Service: Orthopedics;  Laterality: Left;  . KNEE ARTHROSCOPY WITH LATERAL MENISECTOMY Left 04/04/2015   Procedure: AND PARTIAL LATERAL MENISECTOMY;  Surgeon: Leandrew Koyanagi, MD;  Location: [facility];  Service: Orthopedics;  Laterality: Left;  . KNEE ARTHROSCOPY WITH MEDIAL MENISECTOMY Left 04/04/2015   Procedure: LEFT KNEE ARTHROSCOPY WITH PARTIAL MEDIAL MENISCECTOMY  AND SYNOVECTOMY;  Surgeon: Leandrew Koyanagi, MD;  Location: [facility];  Service: Orthopedics;  Laterality: Left;  . PERIPHERAL VASCULAR BALLOON ANGIOPLASTY  07/07/2018   Procedure: PERIPHERAL VASCULAR BALLOON ANGIOPLASTY;  Surgeon: Marty Heck, MD;  Location: [facility];  Service: Cardiovascular;;  right AV fistula  . SHOULDER ARTHROSCOPY Bilateral 08/10/2014   Procedure: I & D BILATERAL SHOULDERS ;  Surgeon: Marianna Payment, MD;  Location: [facility];  Service: Orthopedics;  Laterality: Bilateral;  . SMALL INTESTINE SURGERY     Due to Small Bowel Obstruction; "fixed it when they did my gallbladder OR"  . TEE WITHOUT CARDIOVERSION N/A 08/14/2014   Procedure: TRANSESOPHAGEAL ECHOCARDIOGRAM (TEE);  Surgeon: Thayer Headings, MD;  Location: [facility];  Service: Cardiovascular;  Laterality: N/A;  . TENOSYNOVECTOMY Right 08/11/2014   Procedure: RIGHT WRIST IRRIGATION AND DEBRIDEMENT, TENOSYNOVECTOMY;  Surgeon: Marianna Payment, MD;  Location: [facility];  Service: Orthopedics;  Laterality: Right;  . TOTAL KNEE ARTHROPLASTY Left 11/07/2015   Procedure: LEFT TOTAL KNEE ARTHROPLASTY WITH REVISION OF IMPLANTS;  Surgeon: Leandrew Koyanagi, MD;  Location: [facility];  Service: Orthopedics;  Laterality: Left;  .  TUBAL LIGATION      Vitals:   11/29/18 1012 11/29/18 1027  BP: (!) 139/59 (!) 132/55  Pulse: 89 95  SpO2: 98% 98%    Subjective Assessment - 11/29/18 1012    Subjective  She did not go to dialysis on Thursday due to extreme weakness. She had chest pain so went to ED on Saturday and patient reports labs showed low hemoglobin to explain weakness & cardiac workup did not show MI. She is going to dialysis today after PT as she has not been dialyzed for 6 days.  She is not wearing the prosthesis if she is not leaving the house for an appointment. She wears it fro m 9am to 6pm to go to dialysis. No falls.    Currently in Pain?  Yes    Pain Score  5     Pain Location  Chest    Pain Orientation  Left    Pain Descriptors / Indicators  Sharp;Other (Comment)   heart burn  Pain Type  Acute pain    Pain Radiating Towards  aches into left shoulder & upper arm.    Pain Onset  In the past 7 days    Pain Frequency  Constant    Aggravating Factors   see 11/27/2018 ED note         [facility] PT Assessment - 11/29/18 1005      Assessment   Medical Diagnosis  Left Transfemoral Amputation    Referring Provider (PT)  Meridee Score, MD    Onset Date/Surgical Date  05/26/18   socket revision delivery   Hand Dominance  Right      Precautions   Precautions  Fall    Precaution Comments  NO BP RUE      Restrictions   Weight Bearing Restrictions  No      Balance Screen   Has the patient fallen in the past 6 months  No    Has the patient had a decrease in activity level because of a fear of falling?   Yes    Is the patient reluctant to leave their home because of a fear of falling?   No      Home Environment   Living Environment  Private residence    Living Arrangements  Children   102yo son, his girlfriend, her 15yo & 11yo children   Type of [place] to enter    Entrance Stairs-Number of Steps  3    Entrance Stairs-Rails  Left    [place]  Two level;Able to live on main  level with bedroom/bathroom;Full bath on main level    Alternate Level Stairs-Number of Steps  14    Alternate Level Stairs-Rails  None    Home Equipment  Walker - 2 wheels;Walker - 4 wheels;Cane - single point;Bedside commode;Tub bench;Wheelchair - manual;Crutches      Posture/Postural Control   Posture/Postural Control  Postural limitations    Postural Limitations  Rounded Shoulders;Forward head;Flexed trunk;Weight shift right      Transfers   Transfers  Sit to Stand;Stand Pivot Transfers    Sit to Stand  5: Supervision;With upper extremity assist;With armrests;From chair/3-in-1   requirs UE support to stabilize   Stand to Sit  5: Supervision;With upper extremity assist;With armrests;To chair/3-in-1   requires RW support for stability     Ambulation/Gait   Ambulation/Gait  Yes    Ambulation/Gait Assistance  5: Supervision    Ambulation/Gait Assistance Details  Due to chest pain that pt had assessed in ED 2 days ago, PT limited assessment to basic mobility & balance issues.  Pt has excessive UE weight bearing on rollator walker, termial impact of prosthetic knee in termial swing and incomplete, poor weight shift over prosthesis in stance.      Ambulation Distance (Feet)  100 Feet   limited by fatigue   Assistive device  Rollator;Prosthesis    Gait Pattern  Step-through pattern;Decreased step length - right;Decreased stance time - left;Decreased weight shift to left;Left hip hike;Antalgic;Lateral hip instability;Trunk flexed    Ambulation Surface  Indoor;Level    Gait velocity  1.21 ft/sec comfortable & 1.49 ft/sec fast pace with rollator & prosthesis      Berg Balance Test   Sit to Stand  Needs minimal aid to stand or to stabilize   with RW support = 3   Standing Unsupported  Needs several tries to stand 30 seconds unsupported   with RW support = 4  Sitting with Back Unsupported but Feet Supported on Floor or Stool  Able to sit safely and securely 2 minutes    Stand to Sit  Sits  independently, has uncontrolled descent   with RW support = 3   Transfers  Able to transfer safely, definite need of hands    Standing Unsupported with Eyes Closed  Needs help to keep from falling   with RW support = 3   Standing Unsupported with Feet Together  Needs help to attain position and unable to hold for 15 seconds   with RW support = 3   From Standing, Reach Forward with Outstretched Arm  Loses balance while trying/requires external support   with RW support = 3   From Standing Position, Pick up Object from Floor  Unable to try/needs assist to keep balance   with RW support = 2   From Standing Position, Turn to Look Behind Over each Shoulder  Needs assist to keep from losing balance and falling   with RW support = 3   Turn 360 Degrees  Needs assistance while turning   with RW support = 3   Standing Unsupported, Alternately Place Feet on Step/Stool  Needs assistance to keep from falling or unable to try   with RW support = 2   Standing Unsupported, One Foot in [employer] balance while stepping or standing   with RW support = 3   Standing on One Leg  Unable to try or needs assist to prevent fall   with RW support = 2   Total Score  10    Berg comment:  tasks of Berg performed with locked rollator walker for support = 39/56      Prosthetics Assessment - 11/29/18 [place] with  Skin check;Residual limb care;Prosthetic cleaning;Care of non-amputated limb;Correct ply sock adjustment;Proper wear schedule/adjustment;Proper weight-bearing schedule/adjustment    Donning prosthesis   Supervision    Doffing prosthesis   Modified independent (Device/Increase time)    Current prosthetic wear tolerance (days/week)   3 days/wk or only days that leaving apt like dialysis or other medical appts.     Current prosthetic wear tolerance (#hours/day)   8-10 hrs/day but depends on time out of house for appt.     Current prosthetic weight-bearing tolerance  (hours/day)   Patient reports no limb pain but maybe overshadowed by chest pain.     Residual limb condition   No skin issues per pt report    K code/activity level with prosthetic use   K 2 basic community with fixed cadence.  Patient has silicon liner with suction ring suspension, multiaxial knee and single axis foot.                   [facility] Adult PT Treatment/Exercise - 11/29/18 1005      Prosthetics   Prosthetic Care Comments   PT recommended appt with prosthetist for alignment check.  She is not fully seated in socket due to edema as no dialysis for 6 days. Therefore prosthesis appears too tall.     Education Provided  Skin check;Residual limb care;Proper wear schedule/adjustment;Other (comment)   see prosthetic care comments   Person(s) Educated  Patient    Education Method  Explanation;Verbal cues    Education Method  Verbalized understanding;Needs further instruction               PT Short Term Goals - 11/29/18 2147  PT SHORT TERM GOAL #1   Title  Patient verbalizes proper adjustment of ply socks & donning socks with suction ring suspension with prosthesis. (All updated STGs Target Date: 12/29/2018)    Time  1    Period  Months    Status  New    Target Date  12/29/18      PT SHORT TERM GOAL #2   Title  patient reports prosthesis wear daily for >6 hrs including when staying home or going out for medical appointments.    Time  1    Period  Months    Status  Revised    Target Date  12/29/18      PT SHORT TERM GOAL #3   Title  Patient's standing balance without UE support with supervision: static stance 1 minute, scans right / left / up / down and reaches 2" without loss of balance.    Time  1    Period  Months    Status  New    Target Date  12/29/18      PT SHORT TERM GOAL #4   Title  Patient ambulates 350' outdoors on paved surfaces with rollator walker & prosthesis with supervision.     Time  1    Period  Months    Status  Revised    Target  Date  12/29/18      PT SHORT TERM GOAL #5   Title  Patient negotiates ramps & curbs with rollator walker & prosthesis with supervision.     Time  1    Period  Months    Status  On-going    Target Date  12/29/18        PT Long Term Goals - 11/29/18 2153      PT LONG TERM GOAL #1   Title  Patient verbalizes & demonstrates proper prosthetic care with new prosthetic design including prosthesis use at dialysis to enable safe use of prosthesis. (All LTGs Target Date: 02/25/2019)    Time  3    Period  Months    Status  On-going    Target Date  02/25/19      PT LONG TERM GOAL #2   Title  Patient tolerates & reports prosthesis wear daily >75% of awake hours without skin issues or limb pain.     Time  3    Period  Months    Status  On-going    Target Date  02/25/19      PT LONG TERM GOAL #3   Title  Patient ambulates 500' with one seated rest <3 minutes outdoors with rollator walker & prosthesis modified independent for community mobility.     Time  3    Period  Months    Status  On-going    Target Date  02/25/19      PT LONG TERM GOAL #4   Title  Patient negotiates ramps, curbs with rollator walker & stairs 1 rail/cane & prosthesis modified independent to enable community mobility.     Time  3    Period  Months    Status  On-going    Target Date  02/25/19      PT LONG TERM GOAL #5   Title  Improve standing balance with tasks of Berg Balance Test with walker support >50/56    Time  3    Period  Months    Status  On-going    Target Date  02/25/19      Additional  Long Term Goals   Additional Long Term Goals  Yes      PT LONG TERM GOAL #6   Title  Patient ambulates 70' with cane quad tip & prosthesis with supervision.    Time  3    Period  Months    Status  New    Target Date  02/25/19            Plan - 11/29/18 2136    Clinical Impression Statement  This patient was seen today for reassessment. She was last seen 09/01/2018 and placed on hold due to [place] pandemic.  She needs additional skilled PT to progress prosthetic mobility & safety. She presented today with chest pain which was assessed 2 days ago at ED and also reports that she has not been to dialysis since 6 days ago. So PT limited activity to low level basic activities. She ambulated with prosthesis & rollator walker with gait deviations, gait velocity 1.21 ft/sec comfortable pace & 1.49 ft/sec fast pace which all indicate high fall risk.  Her balance is impaired with [employer] 10/56 and balance tasks of Berg with rollator 39/56. Both indicate high fall risk.  She reports only wearing prosthesis when she is leaving home which is only for dialysis during [place] pandemic. This limits mobililty & safety with limited prosthesis wear.  Patient would benefit from additional PT to progress mobility.    Rehab Potential  Good    PT Frequency  2x / week    PT Duration  12 weeks    PT Treatment/Interventions  ADLs/Self Care Home Management;Moist Heat;DME Instruction;Gait training;Stair training;Functional mobility training;Therapeutic activities;Therapeutic exercise;Balance training;Neuromuscular re-education;Patient/family education;Prosthetic Training;Manual techniques;Vestibular    PT Next Visit Plan  check vital signs and follow-up on chest pain, review need for daily prosthetic wear if if staying home, update HEP at counter/sink for balance & strength    Consulted and Agree with Plan of Care  Patient       Patient will benefit from skilled therapeutic intervention in order to improve the following deficits and impairments:  Abnormal gait, Decreased activity tolerance, Decreased balance, Decreased endurance, Decreased knowledge of use of DME, Decreased mobility, Decreased strength, Decreased range of motion, Impaired flexibility, Prosthetic Dependency, Postural dysfunction, Increased edema, Dizziness  Visit Diagnosis: 1. Other abnormalities of gait and mobility   2. Muscle weakness (generalized)   3.  Abnormal posture   4. Unsteadiness on feet   5. Pain in left leg   6. Contracture of muscle, multiple sites   7. Pain in right leg   8. History of falling        Problem List Patient Active Problem List   Diagnosis Date Noted  . Benign essential HTN   . Stage 5 chronic kidney disease not on chronic dialysis ([place])   . Above knee amputation status, left   . DM type 2 causing CKD stage 5 ([place]) 03/21/2017  . Anemia of chronic disease 02/05/2017  . Dysplasia of cervix, high grade CIN 2 01/12/2017  . Chronic diastolic CHF (congestive heart failure) ([place]) 11/08/2015  . Total knee replacement status   . Diabetic peripheral neuropathy ([place])   . Major depression, recurrent, chronic ([place])   . Chronic kidney disease (CKD), stage IV (severe) ([place])   . Hepatitis C 08/16/2014  . Peripheral neuropathy 06/24/2013  . Compulsive tobacco user syndrome 06/24/2013    Jamey Reas PT, DPT 11/29/2018, 9:57 PM  [facility] [address] [place], [place], [address]  Phone: [phone]   Fax:  [phone]  Name: Brandy Houston MRN: [id number] Date of Birth: 07-05-1955

## 2018-11-30 DIAGNOSIS — D509 Iron deficiency anemia, unspecified: Secondary | ICD-10-CM | POA: Diagnosis not present

## 2018-11-30 DIAGNOSIS — N186 End stage renal disease: Secondary | ICD-10-CM | POA: Diagnosis not present

## 2018-11-30 DIAGNOSIS — E1129 Type 2 diabetes mellitus with other diabetic kidney complication: Secondary | ICD-10-CM | POA: Diagnosis not present

## 2018-11-30 DIAGNOSIS — D631 Anemia in chronic kidney disease: Secondary | ICD-10-CM | POA: Diagnosis not present

## 2018-11-30 DIAGNOSIS — N2581 Secondary hyperparathyroidism of renal origin: Secondary | ICD-10-CM | POA: Diagnosis not present

## 2018-12-02 DIAGNOSIS — D509 Iron deficiency anemia, unspecified: Secondary | ICD-10-CM | POA: Diagnosis not present

## 2018-12-02 DIAGNOSIS — N2581 Secondary hyperparathyroidism of renal origin: Secondary | ICD-10-CM | POA: Diagnosis not present

## 2018-12-02 DIAGNOSIS — E1129 Type 2 diabetes mellitus with other diabetic kidney complication: Secondary | ICD-10-CM | POA: Diagnosis not present

## 2018-12-02 DIAGNOSIS — D631 Anemia in chronic kidney disease: Secondary | ICD-10-CM | POA: Diagnosis not present

## 2018-12-02 DIAGNOSIS — N186 End stage renal disease: Secondary | ICD-10-CM | POA: Diagnosis not present

## 2018-12-04 DIAGNOSIS — N186 End stage renal disease: Secondary | ICD-10-CM | POA: Diagnosis not present

## 2018-12-04 DIAGNOSIS — E1129 Type 2 diabetes mellitus with other diabetic kidney complication: Secondary | ICD-10-CM | POA: Diagnosis not present

## 2018-12-04 DIAGNOSIS — D631 Anemia in chronic kidney disease: Secondary | ICD-10-CM | POA: Diagnosis not present

## 2018-12-04 DIAGNOSIS — D509 Iron deficiency anemia, unspecified: Secondary | ICD-10-CM | POA: Diagnosis not present

## 2018-12-04 DIAGNOSIS — N2581 Secondary hyperparathyroidism of renal origin: Secondary | ICD-10-CM | POA: Diagnosis not present

## 2018-12-06 ENCOUNTER — Ambulatory Visit (HOSPITAL_COMMUNITY)
Admission: RE | Admit: 2018-12-06 | Discharge: 2018-12-06 | Disposition: A | Discharge status: Home / Self Care | Payer: Medicare Other | Source: Ambulatory Visit | Attending: Nurse Practitioner | Admitting: Nurse Practitioner

## 2018-12-06 ENCOUNTER — Ambulatory Visit: Payer: Medicare Other | Admitting: Physical Therapy

## 2018-12-06 ENCOUNTER — Other Ambulatory Visit: Payer: Self-pay

## 2018-12-06 DIAGNOSIS — D649 Anemia, unspecified: Secondary | ICD-10-CM | POA: Insufficient documentation

## 2018-12-06 LAB — PREPARE RBC (CROSSMATCH)

## 2018-12-06 LAB — ABO/RH: ABO/RH(D): A POS

## 2018-12-06 MED ORDER — SODIUM CHLORIDE 0.9% IV SOLUTION
Freq: Once | INTRAVENOUS | Status: AC
  Administered 2018-12-06: 10:00:00 via INTRAVENOUS

## 2018-12-06 NOTE — Progress Notes (Signed)
PATIENT CARE CENTER NOTE  Diagnosis: Anemia    Provider: Juanell Fairly, NP   Procedure: 1 unit PRBC's    Note: Patient received 1 unit of blood via PIV. Tolerated well with no adverse reaction. Vital signs stable. Discharge instructions given. Patient alert, oriented and transferred in wheelchair at discharge.

## 2018-12-06 NOTE — Discharge Instructions (Signed)
Blood Transfusion, Adult A blood transfusion is a procedure in which you are given blood through an IV tube. You may need this procedure because of:  Illness.  Surgery.  Injury. The blood may come from someone else (a donor). You may also be able to donate blood for yourself (autologous blood donation). The blood given in a transfusion is made up of different types of cells. You may get:  Red blood cells. These carry oxygen to the cells in the body.  White blood cells. These help you fight infections.  Platelets. These help your blood to clot.  Plasma. This is the liquid part of your blood. It helps with fluid imbalances. If you have a clotting disorder, you may also get other types of blood products. What happens before the procedure?  You will have a blood test to find out your blood type. The test also finds out what type of blood your body will accept and matches it to the donor type.  If you are going to have a planned surgery, you may be able to donate your own blood. This may be done in case you need a transfusion.  If you have had an allergic reaction to a transfusion in the past, you may be given medicine to help prevent a reaction. This medicine may be given to you by mouth or through an IV.  You will have your temperature, blood pressure, and pulse checked.  Follow instructions from your doctor about what you cannot eat or drink.  Ask your doctor about: ? Changing or stopping your regular medicines. This is important if you take diabetes medicines or blood thinners. ? Taking medicines such as aspirin and ibuprofen. These medicines can thin your blood. Do not take these medicines before your procedure if your doctor tells you not to. What happens during the procedure?  An IV tube will be put into one of your veins.  The bag of donated blood will be attached to your IV tube. Then, the blood will enter through your vein.  Your temperature, blood pressure, and pulse will  be checked regularly during the procedure. This is done to find early signs of a transfusion reaction.  If you have any signs or symptoms of a reaction, your transfusion will be stopped. You may also be given medicine.  When the transfusion is done, your IV tube will be taken out.  Pressure may be applied to the IV site for a few minutes.  A bandage (dressing) will be put on the IV site. The procedure may vary among doctors and hospitals. What happens after the procedure?  Your temperature, blood pressure, heart rate, breathing rate, and blood oxygen level will be checked often.  Your blood may be tested to see how you are responding to the transfusion.  You may be warmed with fluids or blankets. This is done to keep the temperature of your body normal. Summary  A blood transfusion is a procedure in which you are given blood through an IV tube.  The blood may come from someone else (a donor). You may also be able to donate blood for yourself.  If you have had an allergic reaction to a transfusion in the past, you may be given medicine to help prevent a reaction. This medicine may be given to you by mouth or through an IV tube.  Your temperature, blood pressure, heart rate, breathing rate, and blood oxygen level will be checked often.  Your blood may be tested to see   how you are responding to the transfusion. This information is not intended to replace advice given to you by your health care provider. Make sure you discuss any questions you have with your health care provider. Document Released: 08/29/2008 Document Revised: 01/25/2016 Document Reviewed: 01/25/2016 Elsevier Interactive Patient Education  2019 [employer]  

## 2018-12-07 DIAGNOSIS — N2581 Secondary hyperparathyroidism of renal origin: Secondary | ICD-10-CM | POA: Diagnosis not present

## 2018-12-07 DIAGNOSIS — E1129 Type 2 diabetes mellitus with other diabetic kidney complication: Secondary | ICD-10-CM | POA: Diagnosis not present

## 2018-12-07 DIAGNOSIS — D509 Iron deficiency anemia, unspecified: Secondary | ICD-10-CM | POA: Diagnosis not present

## 2018-12-07 DIAGNOSIS — D631 Anemia in chronic kidney disease: Secondary | ICD-10-CM | POA: Diagnosis not present

## 2018-12-07 DIAGNOSIS — N186 End stage renal disease: Secondary | ICD-10-CM | POA: Diagnosis not present

## 2018-12-07 LAB — TYPE AND SCREEN
ABO/RH(D): A POS
Antibody Screen: NEGATIVE
Unit division: 0

## 2018-12-07 LAB — BPAM RBC
Blood Product Expiration Date: 202007122359
ISSUE DATE / TIME: 202006220955
Unit Type and Rh: 6200

## 2018-12-09 DIAGNOSIS — D631 Anemia in chronic kidney disease: Secondary | ICD-10-CM | POA: Diagnosis not present

## 2018-12-09 DIAGNOSIS — D509 Iron deficiency anemia, unspecified: Secondary | ICD-10-CM | POA: Diagnosis not present

## 2018-12-09 DIAGNOSIS — E1129 Type 2 diabetes mellitus with other diabetic kidney complication: Secondary | ICD-10-CM | POA: Diagnosis not present

## 2018-12-09 DIAGNOSIS — N186 End stage renal disease: Secondary | ICD-10-CM | POA: Diagnosis not present

## 2018-12-09 DIAGNOSIS — N2581 Secondary hyperparathyroidism of renal origin: Secondary | ICD-10-CM | POA: Diagnosis not present

## 2018-12-10 ENCOUNTER — Ambulatory Visit (INDEPENDENT_AMBULATORY_CARE_PROVIDER_SITE_OTHER): Payer: Medicare Other | Admitting: Family Medicine

## 2018-12-10 ENCOUNTER — Other Ambulatory Visit: Payer: Self-pay

## 2018-12-10 ENCOUNTER — Encounter: Payer: Self-pay | Admitting: Family Medicine

## 2018-12-10 VITALS — BP 134/62 | HR 82 | Temp 99.6°F | Resp 16 | Wt 138.4 lb

## 2018-12-10 DIAGNOSIS — Z130 Encounter for screening for diseases of the blood and blood-forming organs and certain disorders involving the immune mechanism: Secondary | ICD-10-CM | POA: Diagnosis not present

## 2018-12-10 DIAGNOSIS — E1122 Type 2 diabetes mellitus with diabetic chronic kidney disease: Secondary | ICD-10-CM

## 2018-12-10 DIAGNOSIS — F339 Major depressive disorder, recurrent, unspecified: Secondary | ICD-10-CM | POA: Diagnosis not present

## 2018-12-10 DIAGNOSIS — F17200 Nicotine dependence, unspecified, uncomplicated: Secondary | ICD-10-CM

## 2018-12-10 DIAGNOSIS — Z79899 Other long term (current) drug therapy: Secondary | ICD-10-CM

## 2018-12-10 DIAGNOSIS — N185 Chronic kidney disease, stage 5: Secondary | ICD-10-CM

## 2018-12-10 DIAGNOSIS — R079 Chest pain, unspecified: Secondary | ICD-10-CM | POA: Diagnosis not present

## 2018-12-10 DIAGNOSIS — I5032 Chronic diastolic (congestive) heart failure: Secondary | ICD-10-CM

## 2018-12-10 DIAGNOSIS — I10 Essential (primary) hypertension: Secondary | ICD-10-CM

## 2018-12-10 DIAGNOSIS — D638 Anemia in other chronic diseases classified elsewhere: Secondary | ICD-10-CM | POA: Diagnosis not present

## 2018-12-10 LAB — HEPATIC FUNCTION PANEL
ALT: 12 U/L (ref 0–35)
AST: 10 U/L (ref 0–37)
Albumin: 3.9 g/dL (ref 3.5–5.2)
Alkaline Phosphatase: 97 U/L (ref 39–117)
Bilirubin, Direct: 0 mg/dL (ref 0.0–0.3)
Total Bilirubin: 0.3 mg/dL (ref 0.2–1.2)
Total Protein: 7 g/dL (ref 6.0–8.3)

## 2018-12-10 LAB — CBC WITH DIFFERENTIAL/PLATELET
Basophils Absolute: 0 10*3/uL (ref 0.0–0.1)
Basophils Relative: 0.7 % (ref 0.0–3.0)
Eosinophils Absolute: 0.1 10*3/uL (ref 0.0–0.7)
Eosinophils Relative: 1.4 % (ref 0.0–5.0)
HCT: 26.1 % — ABNORMAL LOW (ref 36.0–46.0)
Hemoglobin: 8.8 g/dL — ABNORMAL LOW (ref 12.0–15.0)
Lymphocytes Relative: 17 % (ref 12.0–46.0)
Lymphs Abs: 0.9 10*3/uL (ref 0.7–4.0)
MCHC: 33.6 g/dL (ref 30.0–36.0)
MCV: 97.8 fl (ref 78.0–100.0)
Monocytes Absolute: 0.5 10*3/uL (ref 0.1–1.0)
Monocytes Relative: 9.6 % (ref 3.0–12.0)
Neutro Abs: 3.9 10*3/uL (ref 1.4–7.7)
Neutrophils Relative %: 71.3 % (ref 43.0–77.0)
Platelets: 214 10*3/uL (ref 150.0–400.0)
RBC: 2.66 Mil/uL — ABNORMAL LOW (ref 3.87–5.11)
RDW: 16.4 % — ABNORMAL HIGH (ref 11.5–15.5)
WBC: 5.5 10*3/uL (ref 4.0–10.5)

## 2018-12-10 LAB — POCT GLYCOSYLATED HEMOGLOBIN (HGB A1C): Hemoglobin A1C: 5.4 % (ref 4.0–5.6)

## 2018-12-10 LAB — LIPID PANEL
Cholesterol: 111 mg/dL (ref 0–200)
HDL: 61.4 mg/dL (ref >39.00)
LDL Cholesterol: 33 mg/dL (ref 0–99)
NonHDL: 49.18
Total CHOL/HDL Ratio: 2
Triglycerides: 82 mg/dL (ref 0.0–149.0)
VLDL: 16.4 mg/dL (ref 0.0–40.0)

## 2018-12-10 LAB — LIPASE: Lipase: 48 U/L (ref 11.0–59.0)

## 2018-12-10 MED ORDER — POLYETHYLENE GLYCOL 3350 17 G PO PACK: 17.0000 g | PACK | Freq: Every day | ORAL | Status: AC | PRN

## 2018-12-10 MED ORDER — SHINGRIX 50 MCG/0.5ML IM SUSR: 0.5000 mL | Freq: Once | INTRAMUSCULAR | 0 refills | Status: AC

## 2018-12-10 NOTE — Progress Notes (Signed)
Subjective  CC:  Chief Complaint  Patient presents with  . Diabetes    She reports that her sugars have been up and down    HPI: Brandy Houston is a 63 y.o. female who presents to the office today for follow up of diabetes, hypertension and problems listed above in the chief complaint.  She was last in my office a year ago.  Since then, she has been restarted on hemodialysis 3 times weekly due to end-stage renal disease due to diabetes.  She is also had 2 fistula repairs.  She is currently undergoing physical therapy due to her above-knee amputation.  Earlier this week, she was transfused 2 units packed red blood cells due to worsening anemia thought to be related to her chronic kidney disease.  ER follow-up: She was seen earlier this month for atypical left-sided chest pain that she says persist.  It is worse after dialysis.  Although she does not describe worsening pain with physical therapy as well.  Described as a catch with deep breaths.  She denies heaviness in the chest, chronic shortness of breath, nausea, vomiting but does admit to left upper quadrant pain intermittently.  I reviewed labs from the ER.  Hemoglobin was 7.1, this was prior to her transfusion.  She had a guaiac positive stool.  Review of systems is positive for intermittent left upper quadrant pain.  No melena.  She had a normal colonoscopy 5 years ago reportedly done at [facility]  I do not have those records.  Diabetic f/u: Her diabetic control is reported as Unchanged.  She reports she takes 15 units of Lantus nightly.  She admits to 1 episode of hypoglycemia into the 30s.  She was aware. She denies exertional CP or SOB or symptomatic hypoglycemia. She denies foot sores.    Hypertension f/u: Control is good . Pt reports she is doing well. taking medications as instructed, no medication side effects noted, no TIAs, no chest pain on exertion, no dyspnea on exertion, no swelling of ankles.  Managed by renal She denies adverse  effects from his BP medications. Compliance with medication is good.    Hyperlipidemia f/u: Patient presents for follow up of lipids. Lipids have been well controlled on meds w/o myalgias or side effects.  She is due for lipid panel.  Depression: Well-controlled on medications.  Assessment  1. 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 Benign essential HTN   3. Anemia of chronic disease   4. Chronic diastolic CHF (congestive heart failure) ([place])   5. Compulsive tobacco user syndrome   6. Major depression, recurrent, chronic ([place])   7. On statin therapy due to risk of future cardiovascular event      Plan   Diabetes is currently too tightly controlled.  Educated and decrease Lantus to 12 units nightly.  Recheck in 3 months.  May be able to go down to 10 or stop altogether.  Educated on need to have more frequent follow-up with me.  Recommend every 3 months.  Hypertension is currently well controlled.  Her renal  Hyperlipidemia f/u: Continue statin recheck lipids.  Check liver function.  Depression is well controlled  Anemia due to end-stage renal failure status post transfusion.  Sent home with [facility] for occult blood in stool screen. May need gi referral.  Diabetic education: ongoing education regarding chronic disease management for diabetes was given today. We continue to reinforce the ABC's of diabetic management: A1c (<7 or 8 dependent upon  patient), tight blood pressure control, and cholesterol management with goal LDL < 100 minimally. We discuss diet strategies, exercise recommendations, medication options and possible side effects. At each visit, we review recommended immunizations and preventive care recommendations for diabetics and stress that good diabetic control can prevent other problems. See below for this patient's data. Hypertension education: ongoing education regarding management of these chronic disease states was given. Management strategies discussed on successive  visits include dietary and exercise recommendations, goals of achieving and maintaining IBW, and lifestyle modifications aiming for adequate sleep and minimizing stressors.   Follow up: Return in about 3 months (around 03/12/2019) for follow up of diabetes and hypertension..  Orders Placed This Encounter  Procedures  . POCT glycosylated hemoglobin (Hb A1C)   Meds ordered this encounter  Medications  . Zoster Vaccine Adjuvanted [facility]) injection    Sig: Inject 0.5 mLs into the muscle once for 1 dose. Please give 2nd dose 2-6 months after first dose    Dispense:  2 each    Refill:  0      I reviewed the patients updated PMH, FH, and SocHx.  Patient Active Problem List   Diagnosis Date Noted  . Benign essential HTN     Priority: High  . Stage 5 chronic kidney disease not on chronic dialysis [facility])     Priority: High  . Above knee amputation status, left     Priority: High  . 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 Anemia of chronic disease 02/05/2017    Priority: High  . Chronic diastolic CHF (congestive heart failure) ([place]) 11/08/2015    Priority: High  . Diabetic peripheral neuropathy ([name])     Priority: High  . Compulsive tobacco user syndrome 06/24/2013    Priority: High  . Dysplasia of cervix, high grade CIN 2 01/12/2017    Priority: Medium  . Major depression, recurrent, chronic ([name])     Priority: Medium  . Hepatitis C 08/16/2014    Priority: Medium  . On statin therapy due to risk of future cardiovascular event 12/10/2018  . Total knee replacement status   . Chronic kidney disease (CKD), stage IV (severe) ([place])   . Peripheral neuropathy 06/24/2013   Immunization History  Administered Date(s) Administered  . Hepatitis B, adult 11/07/2014, 12/12/2014  . Hepatitis B, ped/adol 11/07/2014, 12/12/2014  . Influenza Inj Mdck Quad Pf 08/08/2014  . Influenza, Seasonal, Injecte, Preservative Fre 02/08/2015, 02/13/2016  . Influenza,inj,Quad  PF,6+ Mos 08/08/2014, 04/01/2017  . PPD Test 11/13/2015  . Pneumococcal Polysaccharide-23 08/08/2014  . Pneumococcal-Unspecified 08/08/2014  . Tdap 08/25/2016   Health Maintenance  Topic Date Due  . HEMOGLOBIN A1C  08/04/2018  . MAMMOGRAM  09/16/2018  . PAP SMEAR-Modifier  01/13/2019  . INFLUENZA VACCINE  01/15/2019  . OPHTHALMOLOGY EXAM  08/15/2019  . FOOT EXAM  12/10/2019  . COLONOSCOPY  07/02/2023  . TETANUS/TDAP  08/26/2026  . Hepatitis C Screening  Completed  . HIV Screening  Completed   Diabetes and HTN Related Lab Review: Lab Results  Component Value Date   HGBA1C 6.2 (H) 02/01/2018   HGBA1C 6.1 (A) 11/25/2017   HGBA1C 5.2 08/25/2017    Lab Results  Component Value Date   MICROALBUR 266.7 (H) 11/14/2014   Lab Results  Component Value Date   CREATININE 8.24 (H) 11/27/2018   BUN 86 (H) 11/27/2018   NA 136 11/27/2018   K 3.8 11/27/2018   CL 91 (L) 11/27/2018  CO2 24 11/27/2018   Lab Results  Component Value Date   CHOL 121 11/25/2017   CHOL 97 11/08/2015   CHOL 163 02/21/2013   Lab Results  Component Value Date   HDL 47.70 11/25/2017   HDL 29 (L) 11/08/2015   HDL 73 02/21/2013   Lab Results  Component Value Date   LDLCALC 57 11/25/2017   LDLCALC 51 11/08/2015   LDLCALC 60 02/21/2013   Lab Results  Component Value Date   TRIG 79.0 11/25/2017   TRIG 83 11/08/2015   TRIG 150 (H) 02/21/2013   Lab Results  Component Value Date   CHOLHDL 3 11/25/2017   CHOLHDL 3.3 11/08/2015   CHOLHDL 2.2 02/21/2013   No results found for: LDLDIRECT The ASCVD Risk score Mikey Bussing DC Jr., et al., 2013) failed to calculate for the following reasons:   The valid total cholesterol range is 130 to 320 mg/dL  BP Readings from Last 3 Encounters:  12/06/18 (P) 128/65  11/29/18 (!) 132/55  11/27/18 (!) 145/71   Wt Readings from Last 3 Encounters:  12/10/18 138 lb 6.4 oz (62.8 kg)  07/07/18 140 lb (63.5 kg)  06/30/18 130 lb (59 kg)    Allergies: Patient is  allergic to compazine [prochlorperazine]; shellfish-derived products; iodinated diagnostic agents; omnipaque [iohexol]; tape; and sulfa antibiotics. Family History: Patient family history includes Cancer in her brother, mother, and sister; Diabetes in her brother; Heart disease in her father; Hypertension in her brother and sister. Social History:  Patient  reports that she has been smoking cigarettes. She has a 17.50 pack-year smoking history. She has never used smokeless tobacco. She reports previous alcohol use. She reports that she does not use drugs.  Review of Systems: Ophthalmic: negative for eye pain, loss of vision or double vision Cardiovascular: Positive for chest pain Respiratory: negative for SOB or persistent cough Gastrointestinal: Positive for abdominal pain Genitourinary: negative for dysuria or gross hematuria MSK: negative for foot lesions Neurologic: negative for weakness Current Meds  Medication Sig  . amLODipine (NORVASC) 10 MG tablet Take 1 tablet (10 mg total) by mouth at bedtime.  [name] aspirin EC 81 MG tablet Take 81 mg by mouth daily.  [name] atorvastatin (LIPITOR) 10 MG tablet TAKE 1 TABLET BY MOUTH AT BEDTIME  . Blood Glucose Monitoring Suppl (ACCU-CHEK AVIVA PLUS) W/DEVICE KIT Check sugars TID for E11.65  . cloNIDine (CATAPRES) 0.2 MG tablet Take 0.2 mg by mouth 2 (two) times daily.  [name] gabapentin (NEURONTIN) 100 MG capsule Take 200 mg by mouth 3 (three) times daily.  [name] glucose blood (ACCU-CHEK AVIVA) test strip Check sugars TID for E11.65  . hydrALAZINE (APRESOLINE) 100 MG tablet Take 100 mg by mouth as directed. Take 1 tablet TID ONLY ON (SUN MON, WED, FRI)  . Insulin Glargine (LANTUS SOLOSTAR) 100 UNIT/ML Solostar Pen Inject 12 Units into the skin daily at 10 pm. (Patient taking differently: Inject 15 Units into the skin daily. )  . lidocaine-prilocaine (EMLA) cream Apply 1 application topically once.   [name] omeprazole (PRILOSEC) 20 MG capsule Take 20 mg by mouth daily  before breakfast.   . polyethylene glycol (MIRALAX / GLYCOLAX) packet Take 17 g by mouth 2 (two) times daily. (Patient taking differently: Take 17 g by mouth daily as needed for moderate constipation. )  . sertraline (ZOLOFT) 100 MG tablet Take 1 tablet (100 mg total) by mouth daily.  . sevelamer carbonate (RENVELA) 800 MG tablet Take 1,600 mg by mouth 3 (three) times daily with meals.  [name]  traZODone (DESYREL) 50 MG tablet Take 50 mg by mouth at bedtime.    Objective  Vitals: Resp 16   Wt 138 lb 6.4 oz (62.8 kg)   LMP 03/16/2002   BMI 21.68 kg/m  General: well appearing, no acute distress, uses walker to help ambulation, left prosthesis in place Psych:  Alert and oriented, normal mood and affect HEENT:  Normocephalic, atraumatic, moist mucous membranes, supple neck  Cardiovascular:  Nl S1 and S2, RRR mild systolic murmur present without gallop Respiratory:  Good breath sounds bilaterally, CTAB with normal effort, no rales Gastrointestinal: normal BS, soft, mild left upper quadrant tenderness without rebound guarding or masses.  Positive bowel sounds Skin:  Warm, no rashes Neurologic:   Mental status is normal. normal gait    Commons side effects, risks, benefits, and alternatives for medications and treatment plan prescribed today were discussed, and the patient expressed understanding of the given instructions. Patient is instructed to call or message via MyChart if he/she has any questions or concerns regarding our treatment plan. No barriers to understanding were identified. We discussed Red Flag symptoms and signs in detail. Patient expressed understanding regarding what to do in case of urgent or emergency type symptoms.   Medication list was reconciled, printed and provided to the patient in AVS. Patient instructions and summary information was reviewed with the patient as documented in the AVS. This note was prepared with assistance of Dragon voice recognition software. Occasional  wrong-word or sound-a-like substitutions may have occurred due to the inherent limitations of voice recognition software

## 2018-12-10 NOTE — Patient Instructions (Addendum)
Please return in 3 months for diabetes follow up  Please send back the stool cards.  You should do fine after the transfusion.  Please lower your insulin dose back to 12 units nightly.  I have placed a referral to cardiology.   If you have any questions or concerns, please don't hesitate to send me a message via MyChart or call the office at [phone]. Thank you for visiting with Brandy Houston today! It's our pleasure caring for you.

## 2018-12-11 DIAGNOSIS — N186 End stage renal disease: Secondary | ICD-10-CM | POA: Diagnosis not present

## 2018-12-11 DIAGNOSIS — D509 Iron deficiency anemia, unspecified: Secondary | ICD-10-CM | POA: Diagnosis not present

## 2018-12-11 DIAGNOSIS — E1129 Type 2 diabetes mellitus with other diabetic kidney complication: Secondary | ICD-10-CM | POA: Diagnosis not present

## 2018-12-11 DIAGNOSIS — N2581 Secondary hyperparathyroidism of renal origin: Secondary | ICD-10-CM | POA: Diagnosis not present

## 2018-12-11 DIAGNOSIS — D631 Anemia in chronic kidney disease: Secondary | ICD-10-CM | POA: Diagnosis not present

## 2018-12-13 ENCOUNTER — Ambulatory Visit: Payer: Medicare Other | Admitting: Physical Therapy

## 2018-12-13 ENCOUNTER — Other Ambulatory Visit: Payer: Self-pay

## 2018-12-13 ENCOUNTER — Encounter: Payer: Self-pay | Admitting: Physical Therapy

## 2018-12-13 DIAGNOSIS — R2689 Other abnormalities of gait and mobility: Secondary | ICD-10-CM

## 2018-12-13 DIAGNOSIS — M79605 Pain in left leg: Secondary | ICD-10-CM | POA: Diagnosis not present

## 2018-12-13 DIAGNOSIS — R2681 Unsteadiness on feet: Secondary | ICD-10-CM

## 2018-12-13 DIAGNOSIS — R293 Abnormal posture: Secondary | ICD-10-CM | POA: Diagnosis not present

## 2018-12-13 DIAGNOSIS — M6281 Muscle weakness (generalized): Secondary | ICD-10-CM | POA: Diagnosis not present

## 2018-12-13 DIAGNOSIS — M6249 Contracture of muscle, multiple sites: Secondary | ICD-10-CM | POA: Diagnosis not present

## 2018-12-13 DIAGNOSIS — Z9181 History of falling: Secondary | ICD-10-CM

## 2018-12-13 NOTE — Progress Notes (Signed)
Please call patient: I have reviewed his/her lab results. Labs remain stable. Anemia is mildly improved after transfusion. Pt to f/u with renal.

## 2018-12-13 NOTE — Therapy (Signed)
[facility] [address] [place], [place], [address] Phone: [phone]   Fax:  [phone]  Physical Therapy Treatment  Patient Details  Name: Brandy Houston MRN: [id number] Date of Birth: 1956/01/26 Referring Provider (PT): Meridee Score, MD   Encounter Date: 12/13/2018  PT End of Session - 12/13/18 1556    Visit Number  20   progress note /recert done at visit 19   Number of Visits  40    Date for PT Re-Evaluation  02/25/19    Authorization Type  Medicare & Medicaid    PT Start Time  5035    PT Stop Time  1543    PT Time Calculation (min)  47 min    Equipment Utilized During Treatment  Gait belt    Activity Tolerance  Patient tolerated treatment well;No increased pain;Patient limited by fatigue    Behavior During Therapy  [facility] for tasks assessed/performed       Past Medical History:  Diagnosis Date  . Anemia   . Anxiety   . Arthritis    "in my joints" (03/10/2017)  . Chronic diastolic CHF (congestive heart failure) ([place])   . CKD (chronic kidney disease), stage IV ([place])    stage IV. previous HD, none currently 10/30/15 (confirmed 02/05/2017 & 03/10/2017)  . Depression    Chronic  . Diabetic peripheral neuropathy ([place])   . Dysrhythmia    tachycardia, normal ECHO 08-09-14  . GERD (gastroesophageal reflux disease)   . Heart murmur     dx'd 02/05/2017  . Hepatitis C    "tx'd in 2016; I'm negative now" (02/05/2017)  . History of blood transfusion 2017   "w/knee replacement"  . Hypertension   . Osteoarthritis of left knee   . Peripheral neuropathy   . Protein calorie malnutrition ([place])   . Septic arthritis ([place])   . Slow transit constipation   . Type II diabetes mellitus ([name])    IDDM  . Uncontrolled hypertension 02/18/2013  . Unsteady gait     Past Surgical History:  Procedure Laterality Date  . A/V FISTULAGRAM Right 07/07/2018   Procedure: A/V FISTULAGRAM;  Surgeon: Marty Heck, MD;  Location: [facility];  Service: Cardiovascular;  Laterality: Right;  . AMPUTATION Left 03/20/2017   Procedure: LEFT ABOVE KNEE AMPUTATION;  Surgeon: Newt Minion, MD;  Location: [facility];  Service: Orthopedics;  Laterality: Left;  . APPENDECTOMY    . AV FISTULA PLACEMENT Left 09/13/2014   Procedure: Brachial Artery to Brachial Vein Gortex Four - Seven Stretch GRAFT INSERTION Left Forearm;  Surgeon: Mal Misty, MD;  Location: [facility];  Service: Vascular;  Laterality: Left;  . BASCILIC VEIN TRANSPOSITION Right 10/26/2017   Procedure: BASILIC VEIN TRANSPOSITION FIRST STAGE RIGHT ARM;  Surgeon: Conrad [place], MD;  Location: [facility];  Service: Vascular;  Laterality: Right;  . BASCILIC VEIN TRANSPOSITION Right 02/01/2018   Procedure: SECOND STAGE BASILIC VEIN TRANSPOSITION RIGHT UPPER EXTREMITY;  Surgeon: Marty Heck, MD;  Location: [facility];  Service: Vascular;  Laterality: Right;  . CHOLECYSTECTOMY OPEN    . COLON SURGERY    . EXCISIONAL TOTAL KNEE ARTHROPLASTY WITH ANTIBIOTIC SPACERS Left 02/06/2017   Procedure: Incisional total iknee with antibiotic spacer ;  Surgeon: Leandrew Koyanagi, MD;  Location: [facility];  Service: Orthopedics;  Laterality: Left;  . IR FLUORO GUIDE CV LINE RIGHT  02/10/2017  . IR REMOVAL TUN CV CATH W/O FL  04/01/2017  . IR US  GUIDE VASC ACCESS RIGHT  02/10/2017  . IRRIGATION AND DEBRIDEMENT KNEE Left 03/12/2017   Procedure: IRRIGATION AND DEBRIDEMENT LEFT KNEE WITH WOUND VAC APPLICATION;  Surgeon: Leandrew Koyanagi, MD;  Location: [facility];  Service: Orthopedics;  Laterality: Left;  . JOINT REPLACEMENT    . KNEE ARTHROSCOPY Right 08/10/2014   Procedure: ARTHROSCOPY I & D KNEE;  Surgeon: Marianna Payment, MD;  Location: [facility];  Service: Orthopedics;  Laterality: Right;  . KNEE ARTHROSCOPY Left 08/11/2014   Procedure: ARTHROSCOPIC WASHOUT LEFT KNEE;  Surgeon: Marianna Payment, MD;  Location: [facility];  Service: Orthopedics;  Laterality: Left;  . KNEE ARTHROSCOPY Left 08/19/2014   Procedure:  ARTHROSCOPIC WASHOUT LEFT KNEE;  Surgeon: Leandrew Koyanagi, MD;  Location: [facility];  Service: Orthopedics;  Laterality: Left;  . KNEE ARTHROSCOPY WITH LATERAL MENISECTOMY Left 04/04/2015   Procedure: AND PARTIAL LATERAL MENISECTOMY;  Surgeon: Leandrew Koyanagi, MD;  Location: [facility];  Service: Orthopedics;  Laterality: Left;  . KNEE ARTHROSCOPY WITH MEDIAL MENISECTOMY Left 04/04/2015   Procedure: LEFT KNEE ARTHROSCOPY WITH PARTIAL MEDIAL MENISCECTOMY  AND SYNOVECTOMY;  Surgeon: Leandrew Koyanagi, MD;  Location: [facility];  Service: Orthopedics;  Laterality: Left;  . PERIPHERAL VASCULAR BALLOON ANGIOPLASTY  07/07/2018   Procedure: PERIPHERAL VASCULAR BALLOON ANGIOPLASTY;  Surgeon: Marty Heck, MD;  Location: [facility];  Service: Cardiovascular;;  right AV fistula  . SHOULDER ARTHROSCOPY Bilateral 08/10/2014   Procedure: I & D BILATERAL SHOULDERS ;  Surgeon: Marianna Payment, MD;  Location: [facility];  Service: Orthopedics;  Laterality: Bilateral;  . SMALL INTESTINE SURGERY     Due to Small Bowel Obstruction; "fixed it when they did my gallbladder OR"  . TEE WITHOUT CARDIOVERSION N/A 08/14/2014   Procedure: TRANSESOPHAGEAL ECHOCARDIOGRAM (TEE);  Surgeon: Thayer Headings, MD;  Location: [facility];  Service: Cardiovascular;  Laterality: N/A;  . TENOSYNOVECTOMY Right 08/11/2014   Procedure: RIGHT WRIST IRRIGATION AND DEBRIDEMENT, TENOSYNOVECTOMY;  Surgeon: Marianna Payment, MD;  Location: [facility];  Service: Orthopedics;  Laterality: Right;  . TOTAL KNEE ARTHROPLASTY Left 11/07/2015   Procedure: LEFT TOTAL KNEE ARTHROPLASTY WITH REVISION OF IMPLANTS;  Surgeon: Leandrew Koyanagi, MD;  Location: [facility];  Service: Orthopedics;  Laterality: Left;  . TUBAL LIGATION      There were no vitals filed for this visit.  Subjective Assessment - 12/13/18 1504    Subjective  Pt had PCP visit 12/10/18 and MD plans to refer pt to a cardiologist.  Pt plans to make prosthetist appointment  soon.  Pt has been walking in the house without AD due to feeling like she can furniture walk.    Currently in Pain?  No/denies    Pain Onset  In the past 7 days                       [facility] Adult PT Treatment/Exercise - 12/13/18 0001      Transfers   Transfers  Stand Pivot Transfers    Sit to Stand  5: Supervision;With upper extremity assist;With armrests;From chair/3-in-1    Sit to Stand Details  --   cues for locking rollator brakes before sitting.     Ambulation/Gait   Ambulation/Gait  Yes    Ambulation/Gait Assistance  5: Supervision    Ambulation/Gait Assistance Details  cues for left weight shift  for proper left knee bend, posture, and visual scanning.   Ambulation Distance (Feet)  250  Feet   x2   Assistive device  Rollator;Prosthesis    Gait Pattern  Step-through pattern;Decreased step length - right;Decreased stance time - left;Decreased weight shift to left;Left hip hike;Antalgic;Lateral hip instability;Trunk flexed    Ambulation Surface  Level;Indoor      Prosthetics   Prosthetic Care Comments   Reveiwed STG of wearing prosthesis daily not just for appointments.    Current prosthetic wear tolerance (days/week)   3 days/wk or only days that leaving apt like dialysis or other medical appts.     Current prosthetic wear tolerance (#hours/day)   8-10 hrs/day but depends on time out of house for appt.     Current prosthetic weight-bearing tolerance (hours/day)   Patient reports no limb pain.     Residual limb condition   No skin issues per pt report    Education Provided  Proper wear schedule/adjustment;Proper weight-bearing schedule/adjustment    Person(s) Educated  Patient    Education Method  Explanation    Education Method  Verbalized understanding;Returned demonstration;Verbal cues required;Needs further instruction          Balance Exercises - 12/13/18 1536      Balance Exercises: Standing   Standing Eyes Opened  Wide ([place]);Head turns   education  for midline awareness, weight shifting- laterally, A/P, and upper trunk rotation.   Stepping Strategy  Anterior;Posterior   stepping forward and back with 1 UE support, cues for weight shifting       PT Education - 12/13/18 1553    Education Details  Reviewed STG for wearing prosthesis at  home. Recommend pt use AD in the home vs. no device due to fall risk.  Reveiwed schedule for PT appointments.  Reviewed and performed HEP at sink; pt reports having HEP handout at home.    Person(s) Educated  Patient    Methods  Explanation;Demonstration;Tactile cues;Verbal cues    Comprehension  Verbalized understanding;Returned demonstration;Verbal cues required;Tactile cues required;Need further instruction       PT Short Term Goals - 11/29/18 2147      PT SHORT TERM GOAL #1   Title  Patient verbalizes proper adjustment of ply socks & donning socks with suction ring suspension with prosthesis. (All updated STGs Target Date: 12/29/2018)    Time  1    Period  Months    Status  New    Target Date  12/29/18      PT SHORT TERM GOAL #2   Title  patient reports prosthesis wear daily for >6 hrs including when staying home or going out for medical appointments.    Time  1    Period  Months    Status  Revised    Target Date  12/29/18      PT SHORT TERM GOAL #3   Title  Patient's standing balance without UE support with supervision: static stance 1 minute, scans right / left / up / down and reaches 2" without loss of balance.    Time  1    Period  Months    Status  New    Target Date  12/29/18      PT SHORT TERM GOAL #4   Title  Patient ambulates 350' outdoors on paved surfaces with rollator walker & prosthesis with supervision.     Time  1    Period  Months    Status  Revised    Target Date  12/29/18      PT SHORT TERM GOAL #5   Title  Patient  negotiates ramps & curbs with rollator walker & prosthesis with supervision.     Time  1    Period  Months    Status  On-going    Target Date   12/29/18        PT Long Term Goals - 11/29/18 2153      PT LONG TERM GOAL #1   Title  Patient verbalizes & demonstrates proper prosthetic care with new prosthetic design including prosthesis use at dialysis to enable safe use of prosthesis. (All LTGs Target Date: 02/25/2019)    Time  3    Period  Months    Status  On-going    Target Date  02/25/19      PT LONG TERM GOAL #2   Title  Patient tolerates & reports prosthesis wear daily >75% of awake hours without skin issues or limb pain.     Time  3    Period  Months    Status  On-going    Target Date  02/25/19      PT LONG TERM GOAL #3   Title  Patient ambulates 500' with one seated rest <3 minutes outdoors with rollator walker & prosthesis modified independent for community mobility.     Time  3    Period  Months    Status  On-going    Target Date  02/25/19      PT LONG TERM GOAL #4   Title  Patient negotiates ramps, curbs with rollator walker & stairs 1 rail/cane & prosthesis modified independent to enable community mobility.     Time  3    Period  Months    Status  On-going    Target Date  02/25/19      PT LONG TERM GOAL #5   Title  Improve standing balance with tasks of [place] Balance Test with walker support >50/56    Time  3    Period  Months    Status  On-going    Target Date  02/25/19      Additional Long Term Goals   Additional Long Term Goals  Yes      PT LONG TERM GOAL #6   Title  Patient ambulates 30' with cane quad tip & prosthesis with supervision.    Time  3    Period  Months    Status  New    Target Date  02/25/19            Plan - 12/13/18 1557    Clinical Impression Statement  Pt reported feeling much better than last visit. Pt was seen by PCP; pt waiting on test results for blood in stool and for referral to cardiologist.  Pt progressed with gait distance and tolerated functional standing balance and weight shifting activties reporting no residual limb pain, using intermittent UE support at  supervision level.    Rehab Potential  Good    PT Frequency  2x / week    PT Duration  12 weeks    PT Treatment/Interventions  ADLs/Self Care Home Management;Moist Heat;DME Instruction;Gait training;Stair training;Functional mobility training;Therapeutic activities;Therapeutic exercise;Balance training;Neuromuscular re-education;Patient/family education;Prosthetic Training;Manual techniques;Vestibular    PT Next Visit Plan  review need for daily prosthetic wear if if staying home, update HEP for strength    Consulted and Agree with Plan of Care  Patient       Patient will benefit from skilled therapeutic intervention in order to improve the following deficits and impairments:  Abnormal gait, Decreased activity tolerance, Decreased  balance, Decreased endurance, Decreased knowledge of use of DME, Decreased mobility, Decreased strength, Decreased range of motion, Impaired flexibility, Prosthetic Dependency, Postural dysfunction, Increased edema, Dizziness  Visit Diagnosis: 1. Other abnormalities of gait and mobility   2. Muscle weakness (generalized)   3. Abnormal posture   4. Unsteadiness on feet   5. History of falling        Problem List Patient Active Problem List   Diagnosis Date Noted  . On statin therapy due to risk of future cardiovascular event 12/10/2018  . Benign essential HTN   . Stage 5 chronic kidney disease not on chronic dialysis ([place])   . Above knee amputation status, left   . DM type 2 causing CKD stage 5 ([place]) 03/21/2017  . Anemia of chronic disease 02/05/2017  . Dysplasia of cervix, high grade CIN 2 01/12/2017  . Chronic diastolic CHF (congestive heart failure) ([place]) 11/08/2015  . Total knee replacement status   . Diabetic peripheral neuropathy ([place])   . Major depression, recurrent, chronic ([place])   . Chronic kidney disease (CKD), stage IV (severe) ([place])   . Hepatitis C 08/16/2014  . Peripheral neuropathy 06/24/2013  . Compulsive tobacco user syndrome  06/24/2013    Bjorn Loser, PTA  12/13/18, 4:05 PM [facility] [address] [place], [place], [address] Phone: [phone]   Fax:  [phone]  Name: Brandy Houston MRN: [id number] Date of Birth: 06-16-1956

## 2018-12-14 DIAGNOSIS — D509 Iron deficiency anemia, unspecified: Secondary | ICD-10-CM | POA: Diagnosis not present

## 2018-12-14 DIAGNOSIS — D631 Anemia in chronic kidney disease: Secondary | ICD-10-CM | POA: Diagnosis not present

## 2018-12-14 DIAGNOSIS — N2581 Secondary hyperparathyroidism of renal origin: Secondary | ICD-10-CM | POA: Diagnosis not present

## 2018-12-14 DIAGNOSIS — E1129 Type 2 diabetes mellitus with other diabetic kidney complication: Secondary | ICD-10-CM | POA: Diagnosis not present

## 2018-12-14 DIAGNOSIS — N186 End stage renal disease: Secondary | ICD-10-CM | POA: Diagnosis not present

## 2018-12-15 DIAGNOSIS — E1129 Type 2 diabetes mellitus with other diabetic kidney complication: Secondary | ICD-10-CM | POA: Diagnosis not present

## 2018-12-15 DIAGNOSIS — N186 End stage renal disease: Secondary | ICD-10-CM | POA: Diagnosis not present

## 2018-12-15 DIAGNOSIS — Z992 Dependence on renal dialysis: Secondary | ICD-10-CM | POA: Diagnosis not present

## 2018-12-16 DIAGNOSIS — N186 End stage renal disease: Secondary | ICD-10-CM | POA: Diagnosis not present

## 2018-12-16 DIAGNOSIS — N2581 Secondary hyperparathyroidism of renal origin: Secondary | ICD-10-CM | POA: Diagnosis not present

## 2018-12-16 DIAGNOSIS — D509 Iron deficiency anemia, unspecified: Secondary | ICD-10-CM | POA: Diagnosis not present

## 2018-12-16 DIAGNOSIS — D631 Anemia in chronic kidney disease: Secondary | ICD-10-CM | POA: Diagnosis not present

## 2018-12-16 DIAGNOSIS — Z23 Encounter for immunization: Secondary | ICD-10-CM | POA: Diagnosis not present

## 2018-12-16 DIAGNOSIS — E1129 Type 2 diabetes mellitus with other diabetic kidney complication: Secondary | ICD-10-CM | POA: Diagnosis not present

## 2018-12-18 DIAGNOSIS — N186 End stage renal disease: Secondary | ICD-10-CM | POA: Diagnosis not present

## 2018-12-18 DIAGNOSIS — E1129 Type 2 diabetes mellitus with other diabetic kidney complication: Secondary | ICD-10-CM | POA: Diagnosis not present

## 2018-12-18 DIAGNOSIS — D631 Anemia in chronic kidney disease: Secondary | ICD-10-CM | POA: Diagnosis not present

## 2018-12-18 DIAGNOSIS — D509 Iron deficiency anemia, unspecified: Secondary | ICD-10-CM | POA: Diagnosis not present

## 2018-12-18 DIAGNOSIS — Z23 Encounter for immunization: Secondary | ICD-10-CM | POA: Diagnosis not present

## 2018-12-18 DIAGNOSIS — N2581 Secondary hyperparathyroidism of renal origin: Secondary | ICD-10-CM | POA: Diagnosis not present

## 2018-12-20 ENCOUNTER — Other Ambulatory Visit: Payer: Self-pay

## 2018-12-20 ENCOUNTER — Ambulatory Visit: Payer: Medicare Other | Attending: Orthopedic Surgery | Admitting: Physical Therapy

## 2018-12-20 ENCOUNTER — Encounter: Payer: Self-pay | Admitting: Physical Therapy

## 2018-12-20 DIAGNOSIS — R2689 Other abnormalities of gait and mobility: Secondary | ICD-10-CM | POA: Diagnosis not present

## 2018-12-20 DIAGNOSIS — R293 Abnormal posture: Secondary | ICD-10-CM

## 2018-12-20 DIAGNOSIS — M6281 Muscle weakness (generalized): Secondary | ICD-10-CM

## 2018-12-20 DIAGNOSIS — R262 Difficulty in walking, not elsewhere classified: Secondary | ICD-10-CM | POA: Insufficient documentation

## 2018-12-20 DIAGNOSIS — Z9181 History of falling: Secondary | ICD-10-CM | POA: Insufficient documentation

## 2018-12-20 DIAGNOSIS — R2681 Unsteadiness on feet: Secondary | ICD-10-CM | POA: Insufficient documentation

## 2018-12-20 DIAGNOSIS — M79605 Pain in left leg: Secondary | ICD-10-CM | POA: Diagnosis not present

## 2018-12-20 NOTE — Therapy (Signed)
[facility] [address] [place], [place], [address] Phone: [phone]   Fax:  [phone]  Physical Therapy Treatment  Patient Details  Name: Brandy Houston MRN: [id number] Date of Birth: Mar 08, 1956 Referring Provider (PT): Meridee Score, MD   Encounter Date: 12/20/2018  PT End of Session - 12/20/18 1554    Visit Number  21   progress note /recert done at visit 19   Number of Visits  40    Date for PT Re-Evaluation  02/25/19    Authorization Type  Medicare & Medicaid    PT Start Time  1500    PT Stop Time  1540    PT Time Calculation (min)  40 min    Equipment Utilized During Treatment  Gait belt    Activity Tolerance  Patient tolerated treatment well;No increased pain;Patient limited by fatigue    Behavior During Therapy  [facility] for tasks assessed/performed       Past Medical History:  Diagnosis Date  . Anemia   . Anxiety   . Arthritis    "in my joints" (03/10/2017)  . Chronic diastolic CHF (congestive heart failure) ([place])   . CKD (chronic kidney disease), stage IV ([place])    stage IV. previous HD, none currently 10/30/15 (confirmed 02/05/2017 & 03/10/2017)  . Depression    Chronic  . Diabetic peripheral neuropathy ([place])   . Dysrhythmia    tachycardia, normal ECHO 08-09-14  . GERD (gastroesophageal reflux disease)   . Heart murmur     dx'd 02/05/2017  . Hepatitis C    "tx'd in 2016; I'm negative now" (02/05/2017)  . History of blood transfusion 2017   "w/knee replacement"  . Hypertension   . Osteoarthritis of left knee   . Peripheral neuropathy   . Protein calorie malnutrition ([place])   . Septic arthritis ([place])   . Slow transit constipation   . Type II diabetes mellitus ([name])    IDDM  . Uncontrolled hypertension 02/18/2013  . Unsteady gait     Past Surgical History:  Procedure Laterality Date  . A/V FISTULAGRAM Right 07/07/2018   Procedure: A/V FISTULAGRAM;  Surgeon: Marty Heck, MD;  Location: [facility];  Service: Cardiovascular;  Laterality: Right;  . AMPUTATION Left 03/20/2017   Procedure: LEFT ABOVE KNEE AMPUTATION;  Surgeon: Newt Minion, MD;  Location: [facility];  Service: Orthopedics;  Laterality: Left;  . APPENDECTOMY    . AV FISTULA PLACEMENT Left 09/13/2014   Procedure: Brachial Artery to Brachial Vein Gortex Four - Seven Stretch GRAFT INSERTION Left Forearm;  Surgeon: Mal Misty, MD;  Location: [facility];  Service: Vascular;  Laterality: Left;  . BASCILIC VEIN TRANSPOSITION Right 10/26/2017   Procedure: BASILIC VEIN TRANSPOSITION FIRST STAGE RIGHT ARM;  Surgeon: Conrad Mobeetie, MD;  Location: [facility];  Service: Vascular;  Laterality: Right;  . BASCILIC VEIN TRANSPOSITION Right 02/01/2018   Procedure: SECOND STAGE BASILIC VEIN TRANSPOSITION RIGHT UPPER EXTREMITY;  Surgeon: Marty Heck, MD;  Location: [facility];  Service: Vascular;  Laterality: Right;  . CHOLECYSTECTOMY OPEN    . COLON SURGERY    . EXCISIONAL TOTAL KNEE ARTHROPLASTY WITH ANTIBIOTIC SPACERS Left 02/06/2017   Procedure: Incisional total iknee with antibiotic spacer ;  Surgeon: Leandrew Koyanagi, MD;  Location: [facility];  Service: Orthopedics;  Laterality: Left;  . IR FLUORO GUIDE CV LINE RIGHT  02/10/2017  . IR REMOVAL TUN CV CATH W/O FL  04/01/2017  . IR US  GUIDE VASC ACCESS RIGHT  02/10/2017  . IRRIGATION AND DEBRIDEMENT KNEE Left 03/12/2017   Procedure: IRRIGATION AND DEBRIDEMENT LEFT KNEE WITH WOUND VAC APPLICATION;  Surgeon: Leandrew Koyanagi, MD;  Location: [facility];  Service: Orthopedics;  Laterality: Left;  . JOINT REPLACEMENT    . KNEE ARTHROSCOPY Right 08/10/2014   Procedure: ARTHROSCOPY I & D KNEE;  Surgeon: Marianna Payment, MD;  Location: [facility];  Service: Orthopedics;  Laterality: Right;  . KNEE ARTHROSCOPY Left 08/11/2014   Procedure: ARTHROSCOPIC WASHOUT LEFT KNEE;  Surgeon: Marianna Payment, MD;  Location: [facility];  Service: Orthopedics;  Laterality: Left;  . KNEE ARTHROSCOPY Left 08/19/2014   Procedure:  ARTHROSCOPIC WASHOUT LEFT KNEE;  Surgeon: Leandrew Koyanagi, MD;  Location: [facility];  Service: Orthopedics;  Laterality: Left;  . KNEE ARTHROSCOPY WITH LATERAL MENISECTOMY Left 04/04/2015   Procedure: AND PARTIAL LATERAL MENISECTOMY;  Surgeon: Leandrew Koyanagi, MD;  Location: [facility];  Service: Orthopedics;  Laterality: Left;  . KNEE ARTHROSCOPY WITH MEDIAL MENISECTOMY Left 04/04/2015   Procedure: LEFT KNEE ARTHROSCOPY WITH PARTIAL MEDIAL MENISCECTOMY  AND SYNOVECTOMY;  Surgeon: Leandrew Koyanagi, MD;  Location: [facility];  Service: Orthopedics;  Laterality: Left;  . PERIPHERAL VASCULAR BALLOON ANGIOPLASTY  07/07/2018   Procedure: PERIPHERAL VASCULAR BALLOON ANGIOPLASTY;  Surgeon: Marty Heck, MD;  Location: [facility];  Service: Cardiovascular;;  right AV fistula  . SHOULDER ARTHROSCOPY Bilateral 08/10/2014   Procedure: I & D BILATERAL SHOULDERS ;  Surgeon: Marianna Payment, MD;  Location: [facility];  Service: Orthopedics;  Laterality: Bilateral;  . SMALL INTESTINE SURGERY     Due to Small Bowel Obstruction; "fixed it when they did my gallbladder OR"  . TEE WITHOUT CARDIOVERSION N/A 08/14/2014   Procedure: TRANSESOPHAGEAL ECHOCARDIOGRAM (TEE);  Surgeon: Thayer Headings, MD;  Location: [facility];  Service: Cardiovascular;  Laterality: N/A;  . TENOSYNOVECTOMY Right 08/11/2014   Procedure: RIGHT WRIST IRRIGATION AND DEBRIDEMENT, TENOSYNOVECTOMY;  Surgeon: Marianna Payment, MD;  Location: [facility];  Service: Orthopedics;  Laterality: Right;  . TOTAL KNEE ARTHROPLASTY Left 11/07/2015   Procedure: LEFT TOTAL KNEE ARTHROPLASTY WITH REVISION OF IMPLANTS;  Surgeon: Leandrew Koyanagi, MD;  Location: [facility];  Service: Orthopedics;  Laterality: Left;  . TUBAL LIGATION      There were no vitals filed for this visit.  Subjective Assessment - 12/20/18 1502    Subjective  Pt has not contacted [facility] for any adjustments yet.  Pt feels good.    Pertinent History  left TFA, DM,  neuropathy, Hep C, HTN, CHF, depression, CKD st 5 not dialysis, arthritis, heart murmur,     Currently in Pain?  No/denies    Pain Onset  In the past 7 days                       [facility] Adult PT Treatment/Exercise - 12/20/18 0001      Transfers   Transfers  Stand Pivot Transfers    Sit to Stand  5: Supervision;With upper extremity assist   Reminder to lock both brakes and reach back with both hands     Ambulation/Gait   Ambulation/Gait  Yes    Ambulation/Gait Assistance  5: Supervision;4: Min assist    Ambulation/Gait Assistance Details  practising posture (cues to reduce UE weight bearing), visual scanning, left weigh shifting 2. Training with SPC initally at counter progressing to [facility]  increased height of personal rollator one notch to help with posture   Ambulation Distance (Feet)  375 Feet   + 100 with SPC and HHA+1   Assistive device  Rollator;Prosthesis;Straight cane    Gait Pattern  Step-through pattern;Decreased step length - right;Decreased stance time - left;Decreased weight shift to left;Left hip hike;Antalgic;Lateral hip instability;Trunk flexed    Ambulation Surface  Level;Indoor    Stairs  Yes    Stairs Assistance  6: Modified independent (Device/Increase time)    Stair Management Technique  Two rails;Step to pattern    Ramp  6: Modified independent (Device)    Curb  6: Modified independent (Device/increase time)      Prosthetics   Prosthetic Care Comments   Pt prefers not to wear prosthesis unless going out for an appointment but has a lot of appointments    Current prosthetic wear tolerance (days/week)   daily except Sunday because she had no appointments    Current prosthetic wear tolerance (#hours/day)   8-10 hrs/day but depends on time out of house for appt.     Current prosthetic weight-bearing tolerance (hours/day)   Patient reports no limb pain.     Residual limb condition   No skin issues per pt  report    Education Provided  Proper wear schedule/adjustment;Proper weight-bearing schedule/adjustment    Person(s) Educated  Patient    Education Method  Explanation    Education Method  Verbalized understanding          Balance Exercises - 12/20/18 1556      Balance Exercises: Standing   Standing Eyes Opened  Wide (BOA);Time;Head turns   1 min without UE support, head turns and alt UE reaching FW.        PT Education - 12/20/18 1553    Education provided  Yes    Education Details  Discussed STGs and when they will be checked.    Person(s) Educated  Patient    Methods  Explanation    Comprehension  Verbalized understanding       PT Short Term Goals - 11/29/18 2147      PT SHORT TERM GOAL #1   Title  Patient verbalizes proper adjustment of ply socks & donning socks with suction ring suspension with prosthesis. (All updated STGs Target Date: 12/29/2018)    Time  1    Period  Months    Status  New    Target Date  12/29/18      PT SHORT TERM GOAL #2   Title  patient reports prosthesis wear daily for >6 hrs including when staying home or going out for medical appointments.    Time  1    Period  Months    Status  Revised    Target Date  12/29/18      PT SHORT TERM GOAL #3   Title  Patient's standing balance without UE support with supervision: static stance 1 minute, scans right / left / up / down and reaches 2" without loss of balance.    Time  1    Period  Months    Status  New    Target Date  12/29/18      PT SHORT TERM GOAL #4   Title  Patient ambulates 350' outdoors on paved surfaces with rollator walker & prosthesis with supervision.     Time  1    Period  Months    Status  Revised    Target Date  12/29/18  PT SHORT TERM GOAL #5   Title  Patient negotiates ramps & curbs with rollator walker & prosthesis with supervision.     Time  1    Period  Months    Status  On-going    Target Date  12/29/18        PT Long Term Goals - 11/29/18 2153       PT LONG TERM GOAL #1   Title  Patient verbalizes & demonstrates proper prosthetic care with new prosthetic design including prosthesis use at dialysis to enable safe use of prosthesis. (All LTGs Target Date: 02/25/2019)    Time  3    Period  Months    Status  On-going    Target Date  02/25/19      PT LONG TERM GOAL #2   Title  Patient tolerates & reports prosthesis wear daily >75% of awake hours without skin issues or limb pain.     Time  3    Period  Months    Status  On-going    Target Date  02/25/19      PT LONG TERM GOAL #3   Title  Patient ambulates 500' with one seated rest <3 minutes outdoors with rollator walker & prosthesis modified independent for community mobility.     Time  3    Period  Months    Status  On-going    Target Date  02/25/19      PT LONG TERM GOAL #4   Title  Patient negotiates ramps, curbs with rollator walker & stairs 1 rail/cane & prosthesis modified independent to enable community mobility.     Time  3    Period  Months    Status  On-going    Target Date  02/25/19      PT LONG TERM GOAL #5   Title  Improve standing balance with tasks of [place] Balance Test with walker support >50/56    Time  3    Period  Months    Status  On-going    Target Date  02/25/19      Additional Long Term Goals   Additional Long Term Goals  Yes      PT LONG TERM GOAL #6   Title  Patient ambulates 30' with cane quad tip & prosthesis with supervision.    Time  3    Period  Months    Status  New    Target Date  02/25/19            Plan - 12/20/18 1531    Clinical Impression Statement  Skilled session focused on reveiwing prosthetic wear, community ambulation with rollator, initiating gait training with SPC and standing balance.  Pt progressed with community gait distance and obstacle negotiation, performing at supervision to modified I level.  Pt required min A to Min guard with gait using SPC.  Pt able to perform low level standing balance activity without UE  support for > 1 min.    Rehab Potential  Good    PT Frequency  2x / week    PT Duration  12 weeks    PT Treatment/Interventions  ADLs/Self Care Home Management;Moist Heat;DME Instruction;Gait training;Stair training;Functional mobility training;Therapeutic activities;Therapeutic exercise;Balance training;Neuromuscular re-education;Patient/family education;Prosthetic Training;Manual techniques;Vestibular    PT Next Visit Plan  review need for daily prosthetic wear if if staying home, update HEP for strength    Consulted and Agree with Plan of Care  Patient       Patient will  benefit from skilled therapeutic intervention in order to improve the following deficits and impairments:  Abnormal gait, Decreased activity tolerance, Decreased balance, Decreased endurance, Decreased knowledge of use of DME, Decreased mobility, Decreased strength, Decreased range of motion, Impaired flexibility, Prosthetic Dependency, Postural dysfunction, Increased edema, Dizziness  Visit Diagnosis: 1. Other abnormalities of gait and mobility   2. Muscle weakness (generalized)   3. Abnormal posture   4. Unsteadiness on feet   5. History of falling        Problem List Patient Active Problem List   Diagnosis Date Noted  . On statin therapy due to risk of future cardiovascular event 12/10/2018  . Benign essential HTN   . Stage 5 chronic kidney disease not on chronic dialysis ([place])   . Above knee amputation status, left   . DM type 2 causing CKD stage 5 ([place]) 03/21/2017  . Anemia of chronic disease 02/05/2017  . Dysplasia of cervix, high grade CIN 2 01/12/2017  . Chronic diastolic CHF (congestive heart failure) ([place]) 11/08/2015  . Total knee replacement status   . Diabetic peripheral neuropathy ([place])   . Major depression, recurrent, chronic ([place])   . Chronic kidney disease (CKD), stage IV (severe) ([place])   . Hepatitis C 08/16/2014  . Peripheral neuropathy 06/24/2013  . Compulsive tobacco user syndrome  06/24/2013    Bjorn Loser, PTA  12/20/18, 3:59 PM [facility] [address] [place], [place], [address] Phone: [phone]   Fax:  [phone]  Name: Brandy Houston MRN: [id number] Date of Birth: [birth year]

## 2018-12-21 ENCOUNTER — Telehealth: Payer: Self-pay

## 2018-12-21 DIAGNOSIS — E1129 Type 2 diabetes mellitus with other diabetic kidney complication: Secondary | ICD-10-CM | POA: Diagnosis not present

## 2018-12-21 DIAGNOSIS — D631 Anemia in chronic kidney disease: Secondary | ICD-10-CM | POA: Diagnosis not present

## 2018-12-21 DIAGNOSIS — N186 End stage renal disease: Secondary | ICD-10-CM | POA: Diagnosis not present

## 2018-12-21 DIAGNOSIS — N2581 Secondary hyperparathyroidism of renal origin: Secondary | ICD-10-CM | POA: Diagnosis not present

## 2018-12-21 DIAGNOSIS — Z23 Encounter for immunization: Secondary | ICD-10-CM | POA: Diagnosis not present

## 2018-12-21 DIAGNOSIS — D509 Iron deficiency anemia, unspecified: Secondary | ICD-10-CM | POA: Diagnosis not present

## 2018-12-21 NOTE — Telephone Encounter (Signed)
NOTES FROM [facility] [phone], REFERRAL SENT TO SCHEDULING

## 2018-12-22 ENCOUNTER — Other Ambulatory Visit: Payer: Self-pay | Admitting: Family Medicine

## 2018-12-23 DIAGNOSIS — N186 End stage renal disease: Secondary | ICD-10-CM | POA: Diagnosis not present

## 2018-12-23 DIAGNOSIS — E1129 Type 2 diabetes mellitus with other diabetic kidney complication: Secondary | ICD-10-CM | POA: Diagnosis not present

## 2018-12-23 DIAGNOSIS — Z23 Encounter for immunization: Secondary | ICD-10-CM | POA: Diagnosis not present

## 2018-12-23 DIAGNOSIS — D631 Anemia in chronic kidney disease: Secondary | ICD-10-CM | POA: Diagnosis not present

## 2018-12-23 DIAGNOSIS — D509 Iron deficiency anemia, unspecified: Secondary | ICD-10-CM | POA: Diagnosis not present

## 2018-12-23 DIAGNOSIS — N2581 Secondary hyperparathyroidism of renal origin: Secondary | ICD-10-CM | POA: Diagnosis not present

## 2018-12-24 ENCOUNTER — Ambulatory Visit: Payer: Medicare Other | Admitting: Physical Therapy

## 2018-12-25 DIAGNOSIS — E1129 Type 2 diabetes mellitus with other diabetic kidney complication: Secondary | ICD-10-CM | POA: Diagnosis not present

## 2018-12-25 DIAGNOSIS — N2581 Secondary hyperparathyroidism of renal origin: Secondary | ICD-10-CM | POA: Diagnosis not present

## 2018-12-25 DIAGNOSIS — N186 End stage renal disease: Secondary | ICD-10-CM | POA: Diagnosis not present

## 2018-12-25 DIAGNOSIS — D509 Iron deficiency anemia, unspecified: Secondary | ICD-10-CM | POA: Diagnosis not present

## 2018-12-25 DIAGNOSIS — D631 Anemia in chronic kidney disease: Secondary | ICD-10-CM | POA: Diagnosis not present

## 2018-12-25 DIAGNOSIS — Z23 Encounter for immunization: Secondary | ICD-10-CM | POA: Diagnosis not present

## 2018-12-27 ENCOUNTER — Ambulatory Visit: Payer: Medicare Other | Admitting: Physical Therapy

## 2018-12-27 ENCOUNTER — Encounter: Payer: Self-pay | Admitting: Physical Therapy

## 2018-12-27 ENCOUNTER — Other Ambulatory Visit: Payer: Self-pay

## 2018-12-27 DIAGNOSIS — Z9181 History of falling: Secondary | ICD-10-CM | POA: Diagnosis not present

## 2018-12-27 DIAGNOSIS — R2689 Other abnormalities of gait and mobility: Secondary | ICD-10-CM

## 2018-12-27 DIAGNOSIS — R293 Abnormal posture: Secondary | ICD-10-CM

## 2018-12-27 DIAGNOSIS — R262 Difficulty in walking, not elsewhere classified: Secondary | ICD-10-CM

## 2018-12-27 DIAGNOSIS — M6281 Muscle weakness (generalized): Secondary | ICD-10-CM | POA: Diagnosis not present

## 2018-12-27 DIAGNOSIS — R2681 Unsteadiness on feet: Secondary | ICD-10-CM | POA: Diagnosis not present

## 2018-12-27 DIAGNOSIS — M79605 Pain in left leg: Secondary | ICD-10-CM | POA: Diagnosis not present

## 2018-12-27 NOTE — Therapy (Addendum)
[facility] [address] [place], [place], [address] Phone: [phone]   Fax:  [phone]  Physical Therapy Treatment  Patient Details  Name: Brandy Houston MRN: [id number] Date of Birth: [birth year] Referring Provider (PT): Meridee Score, MD   Encounter Date: 12/27/2018   CLINIC OPERATION CHANGES: [facility] is open at lower capacity following universal masking, social distancing, and patient screening.  [name] COVID risk of complications score is 5.   PT End of Session - 12/27/18 1550    Visit Number  22   progress note /recert done at visit 19   Number of Visits  40    Date for PT Re-Evaluation  02/25/19    Authorization Type  Medicare & Medicaid    PT Start Time  1505    PT Stop Time  1545    PT Time Calculation (min)  40 min    Equipment Utilized During Treatment  Gait belt    Activity Tolerance  Patient tolerated treatment well;No increased pain;Patient limited by fatigue    Behavior During Therapy  [facility] for tasks assessed/performed       Past Medical History:  Diagnosis Date  . Anemia   . Anxiety   . Arthritis    "in my joints" (03/10/2017)  . Chronic diastolic CHF (congestive heart failure) ([place])   . CKD (chronic kidney disease), stage IV ([place])    stage IV. previous HD, none currently 10/30/15 (confirmed 02/05/2017 & 03/10/2017)  . Depression    Chronic  . Diabetic peripheral neuropathy ([place])   . Dysrhythmia    tachycardia, normal ECHO 08-09-14  . GERD (gastroesophageal reflux disease)   . Heart murmur     dx'd 02/05/2017  . Hepatitis C    "tx'd in 2016; I'm negative now" (02/05/2017)  . History of blood transfusion 2017   "w/knee replacement"  . Hypertension   . Osteoarthritis of left knee   . Peripheral neuropathy   . Protein calorie malnutrition ([place])   . Septic arthritis ([place])   . Slow transit constipation   . Type II diabetes mellitus ([name])    IDDM  . Uncontrolled  hypertension 02/18/2013  . Unsteady gait     Past Surgical History:  Procedure Laterality Date  . A/V FISTULAGRAM Right 07/07/2018   Procedure: A/V FISTULAGRAM;  Surgeon: Marty Heck, MD;  Location: [facility];  Service: Cardiovascular;  Laterality: Right;  . AMPUTATION Left 03/20/2017   Procedure: LEFT ABOVE KNEE AMPUTATION;  Surgeon: Newt Minion, MD;  Location: [facility];  Service: Orthopedics;  Laterality: Left;  . APPENDECTOMY    . AV FISTULA PLACEMENT Left 09/13/2014   Procedure: Brachial Artery to Brachial Vein Gortex Four - Seven Stretch GRAFT INSERTION Left Forearm;  Surgeon: Mal Misty, MD;  Location: [facility];  Service: Vascular;  Laterality: Left;  . BASCILIC VEIN TRANSPOSITION Right 10/26/2017   Procedure: BASILIC VEIN TRANSPOSITION FIRST STAGE RIGHT ARM;  Surgeon: Conrad Deale, MD;  Location: [facility];  Service: Vascular;  Laterality: Right;  . BASCILIC VEIN TRANSPOSITION Right 02/01/2018   Procedure: SECOND STAGE BASILIC VEIN TRANSPOSITION RIGHT UPPER EXTREMITY;  Surgeon: Marty Heck, MD;  Location: [facility];  Service: Vascular;  Laterality: Right;  . CHOLECYSTECTOMY OPEN    . COLON SURGERY    . EXCISIONAL TOTAL KNEE ARTHROPLASTY WITH ANTIBIOTIC SPACERS Left 02/06/2017   Procedure: Incisional total iknee with antibiotic spacer ;  Surgeon: Leandrew Koyanagi, MD;  [facility]: [facility]  OR;  Service: Orthopedics;  Laterality: Left;  . IR FLUORO GUIDE CV LINE RIGHT  02/10/2017  . IR REMOVAL TUN CV CATH W/O FL  04/01/2017  . IR US GUIDE VASC ACCESS RIGHT  02/10/2017  . IRRIGATION AND DEBRIDEMENT KNEE Left 03/12/2017   Procedure: IRRIGATION AND DEBRIDEMENT LEFT KNEE WITH WOUND VAC APPLICATION;  Surgeon: Leandrew Koyanagi, MD;  Location: [facility];  Service: Orthopedics;  Laterality: Left;  . JOINT REPLACEMENT    . KNEE ARTHROSCOPY Right 08/10/2014   Procedure: ARTHROSCOPY I & D KNEE;  Surgeon: Marianna Payment, MD;  Location: [facility];  Service: Orthopedics;  Laterality: Right;  . KNEE  ARTHROSCOPY Left 08/11/2014   Procedure: ARTHROSCOPIC WASHOUT LEFT KNEE;  Surgeon: Marianna Payment, MD;  Location: [facility];  Service: Orthopedics;  Laterality: Left;  . KNEE ARTHROSCOPY Left 08/19/2014   Procedure: ARTHROSCOPIC WASHOUT LEFT KNEE;  Surgeon: Leandrew Koyanagi, MD;  Location: [facility];  Service: Orthopedics;  Laterality: Left;  . KNEE ARTHROSCOPY WITH LATERAL MENISECTOMY Left 04/04/2015   Procedure: AND PARTIAL LATERAL MENISECTOMY;  Surgeon: Leandrew Koyanagi, MD;  Location: [facility];  Service: Orthopedics;  Laterality: Left;  . KNEE ARTHROSCOPY WITH MEDIAL MENISECTOMY Left 04/04/2015   Procedure: LEFT KNEE ARTHROSCOPY WITH PARTIAL MEDIAL MENISCECTOMY  AND SYNOVECTOMY;  Surgeon: Leandrew Koyanagi, MD;  Location: [facility];  Service: Orthopedics;  Laterality: Left;  . PERIPHERAL VASCULAR BALLOON ANGIOPLASTY  07/07/2018   Procedure: PERIPHERAL VASCULAR BALLOON ANGIOPLASTY;  Surgeon: Marty Heck, MD;  Location: [facility];  Service: Cardiovascular;;  right AV fistula  . SHOULDER ARTHROSCOPY Bilateral 08/10/2014   Procedure: I & D BILATERAL SHOULDERS ;  Surgeon: Marianna Payment, MD;  Location: [facility];  Service: Orthopedics;  Laterality: Bilateral;  . SMALL INTESTINE SURGERY     Due to Small Bowel Obstruction; "fixed it when they did my gallbladder OR"  . TEE WITHOUT CARDIOVERSION N/A 08/14/2014   Procedure: TRANSESOPHAGEAL ECHOCARDIOGRAM (TEE);  Surgeon: Thayer Headings, MD;  Location: [facility];  Service: Cardiovascular;  Laterality: N/A;  . TENOSYNOVECTOMY Right 08/11/2014   Procedure: RIGHT WRIST IRRIGATION AND DEBRIDEMENT, TENOSYNOVECTOMY;  Surgeon: Marianna Payment, MD;  Location: [facility];  Service: Orthopedics;  Laterality: Right;  . TOTAL KNEE ARTHROPLASTY Left 11/07/2015   Procedure: LEFT TOTAL KNEE ARTHROPLASTY WITH REVISION OF IMPLANTS;  Surgeon: Leandrew Koyanagi, MD;  Location: [facility];  Service: Orthopedics;  Laterality: Left;  . TUBAL LIGATION       There were no vitals filed for this visit.  Subjective Assessment - 12/27/18 1510    Subjective  Pt felt sick the end of last week due to dialysis.  Pt still has not contacted [facility] for possible socket adjustments. Reports that end of residual limb does get really sore at times and she doesn't wear prosthesis for a day to relieve soreness.    Pertinent History  left TFA, DM, neuropathy, Hep C, HTN, CHF, depression, CKD st 5 not dialysis, arthritis, heart murmur,     Currently in Pain?  Yes    Pain Score  4     Pain Location  Generalized    Pain Orientation  --   all over soreness   Pain Descriptors / Indicators  Sore    Pain Type  Chronic pain    Pain Onset  In the past 7 days    Pain Frequency  Constant    Pain Relieving Factors  stretching  [facility] Adult PT Treatment/Exercise - 12/27/18 0001      Ambulation/Gait   Ambulation/Gait  Yes    Ambulation/Gait Assistance  5: Supervision    Ambulation/Gait Assistance Details  Community gait including barriers and inclines    Ambulation Distance (Feet)  350 Feet    Assistive device  Rollator;Prosthesis    Gait Pattern  Step-through pattern;Decreased step length - right;Decreased stance time - left;Decreased weight shift to left;Left hip hike;Antalgic;Lateral hip instability;Trunk flexed    Ambulation Surface  Level;Indoor    Ramp  6: Modified independent (Device)    Curb  6: Modified independent (Device/increase time)      Prosthetics   Prosthetic Care Comments   Educated pt on importance of following up with prosthetist due to pain at times in residual limb with weight bearing.                               Current prosthetic wear tolerance (days/week)   5 days out of 7 due to not feeling good after dialysis and residual limb being sore.  " I just need a breather"/ just needing a break from wearing prosthesis..     Current prosthetic wear tolerance (#hours/day)   8-10 hrs/day but depends on time  out of house for appt.     Current prosthetic weight-bearing tolerance (hours/day)   Patient reports no limb pain during this session.    Residual limb condition   No skin issues per pt report    Education Provided  Proper wear schedule/adjustment;Proper weight-bearing schedule/adjustment;Correct ply sock adjustment;Skin check    Person(s) Educated  Patient    Education Method  Explanation    Education Method  Verbalized understanding;Needs further instruction          Balance Exercises - 12/27/18 1548      Balance Exercises: Standing   Standing Eyes Opened  Wide ([place]);Head turns   +alt forward reaching without UE support, supervision level   Sidestepping  Upper extremity support   cues for weight shifting onto prosthesis and decrease UE support       PT Education - 12/27/18 1532    Education provided  Yes    Education Details  Discussed STGs checked. Reviewed fall risk when pt does not wear prosthesis and encouraged pt to wear prosthesis more at home. Reviewed ply sock adjustment due to fluid fluctuation and need for correct ply sock adjustment to avoid soreness of residual limb.    Person(s) Educated  Patient    Methods  Explanation    Comprehension  Verbalized understanding       PT Short Term Goals - 12/27/18 1557      PT SHORT TERM GOAL #1   Title  Patient verbalizes proper adjustment of ply socks & donning socks with suction ring suspension with prosthesis. (All updated STGs Target Date: 12/29/2018)    Baseline  12/27/18 Met.    Time  1    Period  Months    Status  Achieved    Target Date  12/29/18      PT SHORT TERM GOAL #2   Title  patient reports prosthesis wear daily for >6 hrs including when staying home or going out for medical appointments.    Baseline  12/27/18 Pt reports wearing 5/7 days per week.  Reports havng a lot of appointments and wearing 8-10 hrs on those days but still feels like she needs a break some days due to  feeling exhausted/sick from dialysis  and at times reports that residual limb is sore from wear of prosthesis and at times wants to have a break from the prosthesis.    Time  1    Period  Months    Status  Not Met    Target Date  12/29/18      PT SHORT TERM GOAL #3   Title  Patient's standing balance without UE support with supervision: static stance 1 minute, scans right / left / up / down and reaches 2" without loss of balance.    Baseline  12/27/18 Met.    Time  1    Period  Months    Status  Achieved    Target Date  12/29/18      PT SHORT TERM GOAL #4   Title  Patient ambulates 350' outdoors on paved surfaces with rollator walker & prosthesis with supervision.     Baseline  12/27/18 Met.    Time  1    Period  Months    Status  Achieved    Target Date  12/29/18      PT SHORT TERM GOAL #5   Title  Patient negotiates ramps & curbs with rollator walker & prosthesis with supervision.     Baseline  12/27/18 Met.    Time  1    Period  Months    Status  Achieved    Target Date  12/29/18        PT Long Term Goals - 11/29/18 2153      PT LONG TERM GOAL #1   Title  Patient verbalizes & demonstrates proper prosthetic care with new prosthetic design including prosthesis use at dialysis to enable safe use of prosthesis. (All LTGs Target Date: 02/25/2019)    Time  3    Period  Months    Status  On-going    Target Date  02/25/19      PT LONG TERM GOAL #2   Title  Patient tolerates & reports prosthesis wear daily >75% of awake hours without skin issues or limb pain.     Time  3    Period  Months    Status  On-going    Target Date  02/25/19      PT LONG TERM GOAL #3   Title  Patient ambulates 500' with one seated rest <3 minutes outdoors with rollator walker & prosthesis modified independent for community mobility.     Time  3    Period  Months    Status  On-going    Target Date  02/25/19      PT LONG TERM GOAL #4   Title  Patient negotiates ramps, curbs with rollator walker & stairs 1 rail/cane & prosthesis  modified independent to enable community mobility.     Time  3    Period  Months    Status  On-going    Target Date  02/25/19      PT LONG TERM GOAL #5   Title  Improve standing balance with tasks of [place] Balance Test with walker support >50/56    Time  3    Period  Months    Status  On-going    Target Date  02/25/19      Additional Long Term Goals   Additional Long Term Goals  Yes      PT LONG TERM GOAL #6   Title  Patient ambulates 30' with cane quad tip & prosthesis with supervision.  Time  3    Period  Months    Status  New    Target Date  02/25/19            Plan - 12/27/18 1611    Clinical Impression Statement  Skilled session focused on checking STGs and working on standing balance.  Pt met all STGs except #2.  Reviewed fall risk when pt does not wear prosthesis and encouraged pt to wear prosthesis more at home. Reviewed ply sock adjustment due to fluid fluctuation and need for correct ply sock adjustment to avoid soreness of residual limb. Encouraged pt to follow up with prosthetist for any socket and alignment adjustments; pt verbalized agreement.    Rehab Potential  Good    PT Frequency  2x / week    PT Duration  12 weeks    PT Treatment/Interventions  ADLs/Self Care Home Management;Moist Heat;DME Instruction;Gait training;Stair training;Functional mobility training;Therapeutic activities;Therapeutic exercise;Balance training;Neuromuscular re-education;Patient/family education;Prosthetic Training;Manual techniques;Vestibular    PT Next Visit Plan  update HEP for strength ( standing T-band exercises) and dynamic balance exercises at the counter. Gait training with quad tip SPC.    Consulted and Agree with Plan of Care  Patient       Patient will benefit from skilled therapeutic intervention in order to improve the following deficits and impairments:  Abnormal gait, Decreased activity tolerance, Decreased balance, Decreased endurance, Decreased knowledge of use of  DME, Decreased mobility, Decreased strength, Decreased range of motion, Impaired flexibility, Prosthetic Dependency, Postural dysfunction, Increased edema, Dizziness  Visit Diagnosis: 1. Other abnormalities of gait and mobility   2. Muscle weakness (generalized)   3. Abnormal posture   4. Unsteadiness on feet   5. History of falling   6. Difficulty in walking, not elsewhere classified        Problem List Patient Active Problem List   Diagnosis Date Noted  . On statin therapy due to risk of future cardiovascular event 12/10/2018  . Benign essential HTN   . Stage 5 chronic kidney disease not on chronic dialysis ([place])   . Above knee amputation status, left   . 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 Anemia of chronic disease 02/05/2017  . Dysplasia of cervix, high grade CIN 2 01/12/2017  . Chronic diastolic CHF (congestive heart failure) ([place]) 11/08/2015  . Total knee replacement status   . Diabetic peripheral neuropathy ([place])   . Major depression, recurrent, chronic ([place])   . Chronic kidney disease (CKD), stage IV (severe) ([place])   . Hepatitis C 08/16/2014  . Peripheral neuropathy 06/24/2013  . Compulsive tobacco user syndrome 06/24/2013    Bjorn Loser, PTA  12/27/18, 4:20 PM [facility] [address] [place], [place], [address] Phone: [phone]   Fax:  [phone]  Name: MICKAELA STARLIN MRN: [id number] Date of Birth: 08/31/1955   PT Short Term Goals - 12/27/18 1731      PT SHORT TERM GOAL #1   Title  Patient verbalizes proper prosthesis wear at dialysis including weighing in & out.  (All updated STGs Target Date: 01/28/2019)    Time  1    Period  Months    Status  New    Target Date  01/28/19      PT SHORT TERM GOAL #2   Title  patient reports prosthesis wear daily on days leaving home including dialysis for >8 hrs & on days staying home >50% of days for 4hours.  [name]  Time  1    Period   Months    Status  Revised    Target Date  01/28/19      PT SHORT TERM GOAL #3   Title  Patient able to safely pick up items from floor with walker support and stands 2 minutes without UE support with supervision.    Time  1    Period  Months    Status  Revised    Target Date  01/28/19      PT SHORT TERM GOAL #4   Title  Patient ambulates 500' outdoors with 1 seated rest for 5 minutes on paved surfaces with rollator walker & prosthesis with supervision.    Time  1    Period  Months    Status  Revised    Target Date  01/28/19      PT SHORT TERM GOAL #5   Title  Patient negotiates stairs with single rail & cane with prosthesis with supervision.    Time  1    Period  Months    Status  Revised    Target Date  01/28/19      Jamey Reas, PT, DPT PT Specializing in [place] 12/27/18 5:38 PM Phone:  [phone]  Fax:  [phone] [facility] [address] [place],  [address]

## 2018-12-28 ENCOUNTER — Other Ambulatory Visit: Payer: Medicare Other

## 2018-12-28 DIAGNOSIS — E1129 Type 2 diabetes mellitus with other diabetic kidney complication: Secondary | ICD-10-CM | POA: Diagnosis not present

## 2018-12-28 DIAGNOSIS — Z23 Encounter for immunization: Secondary | ICD-10-CM | POA: Diagnosis not present

## 2018-12-28 DIAGNOSIS — D631 Anemia in chronic kidney disease: Secondary | ICD-10-CM | POA: Diagnosis not present

## 2018-12-28 DIAGNOSIS — D509 Iron deficiency anemia, unspecified: Secondary | ICD-10-CM | POA: Diagnosis not present

## 2018-12-28 DIAGNOSIS — N2581 Secondary hyperparathyroidism of renal origin: Secondary | ICD-10-CM | POA: Diagnosis not present

## 2018-12-28 DIAGNOSIS — N186 End stage renal disease: Secondary | ICD-10-CM | POA: Diagnosis not present

## 2018-12-28 LAB — FECAL OCCULT BLOOD, IMMUNOCHEMICAL: Fecal Occult Bld: NEGATIVE

## 2018-12-29 NOTE — Progress Notes (Signed)
Please call patient: I have reviewed his/her lab results. 1of 2 hemoccult cards are positive for possible blood in the stool. Reviewed last colonoscopy report 2015: recommended repeat in 5 years due to poor prep. Also recommend repeat now due to worsening anemia and + hemoccult. Please notify patient and refer back to her GI doctor. Thanks.

## 2018-12-30 DIAGNOSIS — N186 End stage renal disease: Secondary | ICD-10-CM | POA: Diagnosis not present

## 2018-12-30 DIAGNOSIS — E1129 Type 2 diabetes mellitus with other diabetic kidney complication: Secondary | ICD-10-CM | POA: Diagnosis not present

## 2018-12-30 DIAGNOSIS — D509 Iron deficiency anemia, unspecified: Secondary | ICD-10-CM | POA: Diagnosis not present

## 2018-12-30 DIAGNOSIS — Z23 Encounter for immunization: Secondary | ICD-10-CM | POA: Diagnosis not present

## 2018-12-30 DIAGNOSIS — N2581 Secondary hyperparathyroidism of renal origin: Secondary | ICD-10-CM | POA: Diagnosis not present

## 2018-12-30 DIAGNOSIS — D631 Anemia in chronic kidney disease: Secondary | ICD-10-CM | POA: Diagnosis not present

## 2018-12-31 ENCOUNTER — Encounter: Payer: Self-pay | Admitting: Physical Therapy

## 2018-12-31 ENCOUNTER — Ambulatory Visit: Payer: Medicare Other | Admitting: Physical Therapy

## 2018-12-31 ENCOUNTER — Other Ambulatory Visit: Payer: Self-pay

## 2018-12-31 DIAGNOSIS — R293 Abnormal posture: Secondary | ICD-10-CM | POA: Diagnosis not present

## 2018-12-31 DIAGNOSIS — M6281 Muscle weakness (generalized): Secondary | ICD-10-CM | POA: Diagnosis not present

## 2018-12-31 DIAGNOSIS — R2681 Unsteadiness on feet: Secondary | ICD-10-CM | POA: Diagnosis not present

## 2018-12-31 DIAGNOSIS — Z9181 History of falling: Secondary | ICD-10-CM | POA: Diagnosis not present

## 2018-12-31 DIAGNOSIS — M79605 Pain in left leg: Secondary | ICD-10-CM | POA: Diagnosis not present

## 2018-12-31 DIAGNOSIS — R2689 Other abnormalities of gait and mobility: Secondary | ICD-10-CM

## 2018-12-31 NOTE — Therapy (Signed)
[facility] [address] [place], [place], [address] Phone: [phone]   Fax:  [phone]  Physical Therapy Treatment  Patient Details  Name: Brandy Houston MRN: [id number] Date of Birth: 1955/07/22 Referring Provider (PT): Meridee Score, MD   Encounter Date: 12/31/2018   CLINIC OPERATION CHANGES: [facility] is open at lower capacity following universal masking, social distancing, and patient screening.   PT End of Session - 12/31/18 1505    Visit Number  23   progress note /recert done at visit 19   Number of Visits  40    Date for PT Re-Evaluation  02/25/19    Authorization Type  Medicare & Medicaid    PT Start Time  1500    PT Stop Time  1546    PT Time Calculation (min)  46 min    Equipment Utilized During Treatment  Gait belt    Activity Tolerance  Patient tolerated treatment well;No increased pain;Patient limited by fatigue    Behavior During Therapy  [facility] for tasks assessed/performed       Past Medical History:  Diagnosis Date  . Anemia   . Anxiety   . Arthritis    "in my joints" (03/10/2017)  . Chronic diastolic CHF (congestive heart failure) ([place])   . CKD (chronic kidney disease), stage IV ([place])    stage IV. previous HD, none currently 10/30/15 (confirmed 02/05/2017 & 03/10/2017)  . Depression    Chronic  . Diabetic peripheral neuropathy ([place])   . Dysrhythmia    tachycardia, normal ECHO 08-09-14  . GERD (gastroesophageal reflux disease)   . Heart murmur     dx'd 02/05/2017  . Hepatitis C    "tx'd in 2016; I'm negative now" (02/05/2017)  . History of blood transfusion 2017   "w/knee replacement"  . Hypertension   . Osteoarthritis of left knee   . Peripheral neuropathy   . Protein calorie malnutrition ([place])   . Septic arthritis ([place])   . Slow transit constipation   . Type II diabetes mellitus ([name])    IDDM  . Uncontrolled hypertension 02/18/2013  . Unsteady gait     Past Surgical  History:  Procedure Laterality Date  . A/V FISTULAGRAM Right 07/07/2018   Procedure: A/V FISTULAGRAM;  Surgeon: Marty Heck, MD;  Location: [facility];  Service: Cardiovascular;  Laterality: Right;  . AMPUTATION Left 03/20/2017   Procedure: LEFT ABOVE KNEE AMPUTATION;  Surgeon: Newt Minion, MD;  Location: [facility];  Service: Orthopedics;  Laterality: Left;  . APPENDECTOMY    . AV FISTULA PLACEMENT Left 09/13/2014   Procedure: Brachial Artery to Brachial Vein Gortex Four - Seven Stretch GRAFT INSERTION Left Forearm;  Surgeon: Mal Misty, MD;  Location: [facility];  Service: Vascular;  Laterality: Left;  . BASCILIC VEIN TRANSPOSITION Right 10/26/2017   Procedure: BASILIC VEIN TRANSPOSITION FIRST STAGE RIGHT ARM;  Surgeon: Conrad Lake Tapawingo, MD;  Location: [facility];  Service: Vascular;  Laterality: Right;  . BASCILIC VEIN TRANSPOSITION Right 02/01/2018   Procedure: SECOND STAGE BASILIC VEIN TRANSPOSITION RIGHT UPPER EXTREMITY;  Surgeon: Marty Heck, MD;  Location: [facility];  Service: Vascular;  Laterality: Right;  . CHOLECYSTECTOMY OPEN    . COLON SURGERY    . EXCISIONAL TOTAL KNEE ARTHROPLASTY WITH ANTIBIOTIC SPACERS Left 02/06/2017   Procedure: Incisional total iknee with antibiotic spacer ;  Surgeon: Leandrew Koyanagi, MD;  Location: [facility];  Service: Orthopedics;  Laterality: Left;  . IR  FLUORO GUIDE CV LINE RIGHT  02/10/2017  . IR REMOVAL TUN CV CATH W/O FL  04/01/2017  . IR US GUIDE VASC ACCESS RIGHT  02/10/2017  . IRRIGATION AND DEBRIDEMENT KNEE Left 03/12/2017   Procedure: IRRIGATION AND DEBRIDEMENT LEFT KNEE WITH WOUND VAC APPLICATION;  Surgeon: Leandrew Koyanagi, MD;  Location: [facility];  Service: Orthopedics;  Laterality: Left;  . JOINT REPLACEMENT    . KNEE ARTHROSCOPY Right 08/10/2014   Procedure: ARTHROSCOPY I & D KNEE;  Surgeon: Marianna Payment, MD;  Location: [facility];  Service: Orthopedics;  Laterality: Right;  . KNEE ARTHROSCOPY Left 08/11/2014   Procedure: ARTHROSCOPIC WASHOUT LEFT  KNEE;  Surgeon: Marianna Payment, MD;  Location: [facility];  Service: Orthopedics;  Laterality: Left;  . KNEE ARTHROSCOPY Left 08/19/2014   Procedure: ARTHROSCOPIC WASHOUT LEFT KNEE;  Surgeon: Leandrew Koyanagi, MD;  Location: [facility];  Service: Orthopedics;  Laterality: Left;  . KNEE ARTHROSCOPY WITH LATERAL MENISECTOMY Left 04/04/2015   Procedure: AND PARTIAL LATERAL MENISECTOMY;  Surgeon: Leandrew Koyanagi, MD;  Location: [facility];  Service: Orthopedics;  Laterality: Left;  . KNEE ARTHROSCOPY WITH MEDIAL MENISECTOMY Left 04/04/2015   Procedure: LEFT KNEE ARTHROSCOPY WITH PARTIAL MEDIAL MENISCECTOMY  AND SYNOVECTOMY;  Surgeon: Leandrew Koyanagi, MD;  Location: [facility];  Service: Orthopedics;  Laterality: Left;  . PERIPHERAL VASCULAR BALLOON ANGIOPLASTY  07/07/2018   Procedure: PERIPHERAL VASCULAR BALLOON ANGIOPLASTY;  Surgeon: Marty Heck, MD;  Location: [facility];  Service: Cardiovascular;;  right AV fistula  . SHOULDER ARTHROSCOPY Bilateral 08/10/2014   Procedure: I & D BILATERAL SHOULDERS ;  Surgeon: Marianna Payment, MD;  Location: [facility];  Service: Orthopedics;  Laterality: Bilateral;  . SMALL INTESTINE SURGERY     Due to Small Bowel Obstruction; "fixed it when they did my gallbladder OR"  . TEE WITHOUT CARDIOVERSION N/A 08/14/2014   Procedure: TRANSESOPHAGEAL ECHOCARDIOGRAM (TEE);  Surgeon: Thayer Headings, MD;  Location: [facility];  Service: Cardiovascular;  Laterality: N/A;  . TENOSYNOVECTOMY Right 08/11/2014   Procedure: RIGHT WRIST IRRIGATION AND DEBRIDEMENT, TENOSYNOVECTOMY;  Surgeon: Marianna Payment, MD;  Location: [facility];  Service: Orthopedics;  Laterality: Right;  . TOTAL KNEE ARTHROPLASTY Left 11/07/2015   Procedure: LEFT TOTAL KNEE ARTHROPLASTY WITH REVISION OF IMPLANTS;  Surgeon: Leandrew Koyanagi, MD;  Location: [facility];  Service: Orthopedics;  Laterality: Left;  . TUBAL LIGATION      There were no vitals filed for this visit.  Subjective  Assessment - 12/31/18 1503    Subjective  No pain tor report. Did have a fall earlier this week after PT in the parking lot. Was getting on the lift for [facility] when the driver informed her it was the wrong one. She went to back up and lost balance on the step and fell. He helped her up. Denies any injuries. No other falls.    Pertinent History  left TFA, DM, neuropathy, Hep C, HTN, CHF, depression, CKD st 5 not dialysis, arthritis, heart murmur,     Limitations  Standing;Walking;House hold activities    Patient Stated Goals  To walk with prosthesis in home & community, travel plane, car & cruise in September    Currently in Pain?  No/denies    Pain Score  0-No pain             [facility] Adult PT Treatment/Exercise - 12/31/18 1526      Transfers   Transfers  Sit to  Stand;Stand to Sit    Sit to Stand  5: Supervision;With upper extremity assist    Stand to Sit  5: Supervision;With upper extremity assist;With armrests;To chair/3-in-1      Ambulation/Gait   Ambulation/Gait  Yes    Ambulation/Gait Assistance  5: Supervision;4: Min guard    Ambulation/Gait Assistance Details  supervision with rollator on indoor surfaces; min guard with rollator on outdoor surfaces with cues on rollator position with gait.     Ambulation Distance (Feet)  260 Feet   x1, plus into/out of gym   Assistive device  Rollator;Prosthesis    Gait Pattern  Step-through pattern;Decreased step length - right;Decreased stance time - left;Decreased weight shift to left;Left hip hike;Antalgic;Lateral hip instability;Trunk flexed    Ambulation Surface  Level;Unlevel;Indoor;Outdoor;Paved      Neuro Re-ed    Neuro Re-ed Details   issued HEP for balance (corner balance). refer to [facility] program for full details.       Prosthetics   Prosthetic Care Comments   see's [facility] next Wednesday    Current prosthetic wear tolerance (days/week)   reports every day since starting back with PT    Current prosthetic wear tolerance  (#hours/day)   8 hours a day    Residual limb condition   No skin issues per pt report    Education Provided  Residual limb care;Proper wear schedule/adjustment;Proper weight-bearing schedule/adjustment    Person(s) Educated  Patient    Education Method  Explanation;Demonstration;Verbal cues;Handout    Education Method  Verbalized understanding;Tactile cues required;Verbal cues required;Needs further instruction    Donning Prosthesis  Supervision    Doffing Prosthesis  Modified independent (device/increased time)      issued the following to HEP today: Access Code: [email or web address]  URL: [email or web address]  Date: 12/31/2018  Prepared by: Willow Ora   Exercises  Standing Balance with Eyes Closed - 3 reps - 1 sets - 30 hold - 1x daily - 5x weekly  Wide Stance with Eyes Closed and Head Rotation - 10 reps - 1 sets - 1x daily - 5x weekly  Wide Stance with Eyes Closed and Head Nods - 10 reps - 1 sets - 1x daily - 5x weekly  Half Tandem Stance Balance with Eyes Closed - 3 reps - 1 sets - 30 hold - 1x daily - 5x weekly        PT Education - 12/31/18 1609    Education Details  balance HEP    Person(s) Educated  Patient    Methods  Explanation;Demonstration;Verbal cues;Handout    Comprehension  Verbalized understanding;Returned demonstration;Verbal cues required;Need further instruction       PT Short Term Goals - 12/27/18 1731      PT SHORT TERM GOAL #1   Title  Patient verbalizes proper prosthesis wear at dialysis including weighing in & out.  (All updated STGs Target Date: 01/28/2019)    Time  1    Period  Months    Status  New    Target Date  01/28/19      PT SHORT TERM GOAL #2   Title  patient reports prosthesis wear daily on days leaving home including dialysis for >8 hrs & on days staying home >50% of days for 4hours.  .    Time  1    Period  Months    Status  Revised    Target Date  01/28/19      PT SHORT TERM GOAL #3   Title  Patient able to safely pick up  items from floor with walker support and stands 2 minutes without UE support with supervision.    Time  1    Period  Months    Status  Revised    Target Date  01/28/19      PT SHORT TERM GOAL #4   Title  Patient ambulates 500' outdoors with 1 seated rest for 5 minutes on paved surfaces with rollator walker & prosthesis with supervision.    Time  1    Period  Months    Status  Revised    Target Date  01/28/19      PT SHORT TERM GOAL #5   Title  Patient negotiates stairs with single rail & cane with prosthesis with supervision.    Time  1    Period  Months    Status  Revised    Target Date  01/28/19        PT Long Term Goals - 11/29/18 2153      PT LONG TERM GOAL #1   Title  Patient verbalizes & demonstrates proper prosthetic care with new prosthetic design including prosthesis use at dialysis to enable safe use of prosthesis. (All LTGs Target Date: 02/25/2019)    Time  3    Period  Months    Status  On-going    Target Date  02/25/19      PT LONG TERM GOAL #2   Title  Patient tolerates & reports prosthesis wear daily >75% of awake hours without skin issues or limb pain.     Time  3    Period  Months    Status  On-going    Target Date  02/25/19      PT LONG TERM GOAL #3   Title  Patient ambulates 500' with one seated rest <3 minutes outdoors with rollator walker & prosthesis modified independent for community mobility.     Time  3    Period  Months    Status  On-going    Target Date  02/25/19      PT LONG TERM GOAL #4   Title  Patient negotiates ramps, curbs with rollator walker & stairs 1 rail/cane & prosthesis modified independent to enable community mobility.     Time  3    Period  Months    Status  On-going    Target Date  02/25/19      PT LONG TERM GOAL #5   Title  Improve standing balance with tasks of Berg Balance Test with walker support >50/56    Time  3    Period  Months    Status  On-going    Target Date  02/25/19      Additional Long Term Goals    Additional Long Term Goals  Yes      PT LONG TERM GOAL #6   Title  Patient ambulates 30' with cane quad tip & prosthesis with supervision.    Time  3    Period  Months    Status  New    Target Date  02/25/19            Plan - 12/31/18 1506    Clinical Impression Statement  Today's skilled session intially focused on issuing corner balance HEP for home with no issues noted. Remainder of session addressed gait distance with rollator on paved surfaces before needing a rest. The pt is making steady progress toward goals and should benefit from continued PT to progress toward unmet goals.  Rehab Potential  Good    PT Frequency  2x / week    PT Duration  12 weeks    PT Treatment/Interventions  ADLs/Self Care Home Management;Moist Heat;DME Instruction;Gait training;Stair training;Functional mobility training;Therapeutic activities;Therapeutic exercise;Balance training;Neuromuscular re-education;Patient/family education;Prosthetic Training;Manual techniques;Vestibular    PT Next Visit Plan  work towads updated STGs; begin short distance gait training with cane (think pt has small base quad cane at home, confirm which cane she has)    Consulted and Agree with Plan of Care  Patient       Patient will benefit from skilled therapeutic intervention in order to improve the following deficits and impairments:  Abnormal gait, Decreased activity tolerance, Decreased balance, Decreased endurance, Decreased knowledge of use of DME, Decreased mobility, Decreased strength, Decreased range of motion, Impaired flexibility, Prosthetic Dependency, Postural dysfunction, Increased edema, Dizziness  Visit Diagnosis: 1. Other abnormalities of gait and mobility   2. Muscle weakness (generalized)   3. Abnormal posture   4. Unsteadiness on feet        Problem List Patient Active Problem List   Diagnosis Date Noted  . On statin therapy due to risk of future cardiovascular event 12/10/2018  . Benign  essential HTN   . 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 Above knee amputation status, left   . DM type 2 causing CKD stage 5 ([place]) 03/21/2017  . Anemia of chronic disease 02/05/2017  . Dysplasia of cervix, high grade CIN 2 01/12/2017  . Chronic diastolic CHF (congestive heart failure) ([place]) 11/08/2015  . Total knee replacement status   . Diabetic peripheral neuropathy ([place])   . Major depression, recurrent, chronic ([place])   . Chronic kidney disease (CKD), stage IV (severe) ([place])   . Hepatitis C 08/16/2014  . Peripheral neuropathy 06/24/2013  . Compulsive tobacco user syndrome 06/24/2013    Willow Ora, PTA, [facility] [address], [place], [place] [address] [phone] 12/31/18, 4:11 PM   Name: Brandy Houston MRN: [id number] Date of Birth: November 28, 1955

## 2018-12-31 NOTE — Patient Instructions (Signed)
Access Code: [email or web address]  URL: [email or web address]  Date: 12/31/2018  Prepared by: Willow Ora   Exercises  Standing Balance with Eyes Closed - 3 reps - 1 sets - 30 hold - 1x daily - 5x weekly  Wide Stance with Eyes Closed and Head Rotation - 10 reps - 1 sets - 1x daily - 5x weekly  Wide Stance with Eyes Closed and Head Nods - 10 reps - 1 sets - 1x daily - 5x weekly  Half Tandem Stance Balance with Eyes Closed - 3 reps - 1 sets - 30 hold - 1x daily - 5x weekly

## 2019-01-01 DIAGNOSIS — N186 End stage renal disease: Secondary | ICD-10-CM | POA: Diagnosis not present

## 2019-01-01 DIAGNOSIS — E1129 Type 2 diabetes mellitus with other diabetic kidney complication: Secondary | ICD-10-CM | POA: Diagnosis not present

## 2019-01-01 DIAGNOSIS — Z23 Encounter for immunization: Secondary | ICD-10-CM | POA: Diagnosis not present

## 2019-01-01 DIAGNOSIS — D509 Iron deficiency anemia, unspecified: Secondary | ICD-10-CM | POA: Diagnosis not present

## 2019-01-01 DIAGNOSIS — D631 Anemia in chronic kidney disease: Secondary | ICD-10-CM | POA: Diagnosis not present

## 2019-01-01 DIAGNOSIS — N2581 Secondary hyperparathyroidism of renal origin: Secondary | ICD-10-CM | POA: Diagnosis not present

## 2019-01-03 ENCOUNTER — Encounter: Payer: Self-pay | Admitting: *Deleted

## 2019-01-04 DIAGNOSIS — E1129 Type 2 diabetes mellitus with other diabetic kidney complication: Secondary | ICD-10-CM | POA: Diagnosis not present

## 2019-01-04 DIAGNOSIS — D509 Iron deficiency anemia, unspecified: Secondary | ICD-10-CM | POA: Diagnosis not present

## 2019-01-04 DIAGNOSIS — N2581 Secondary hyperparathyroidism of renal origin: Secondary | ICD-10-CM | POA: Diagnosis not present

## 2019-01-04 DIAGNOSIS — N186 End stage renal disease: Secondary | ICD-10-CM | POA: Diagnosis not present

## 2019-01-04 DIAGNOSIS — D631 Anemia in chronic kidney disease: Secondary | ICD-10-CM | POA: Diagnosis not present

## 2019-01-04 DIAGNOSIS — Z23 Encounter for immunization: Secondary | ICD-10-CM | POA: Diagnosis not present

## 2019-01-05 ENCOUNTER — Ambulatory Visit: Payer: Medicare Other | Admitting: Physical Therapy

## 2019-01-05 ENCOUNTER — Other Ambulatory Visit: Payer: Self-pay

## 2019-01-05 ENCOUNTER — Encounter: Payer: Self-pay | Admitting: Physical Therapy

## 2019-01-05 VITALS — BP 167/78 | HR 90

## 2019-01-05 DIAGNOSIS — M6281 Muscle weakness (generalized): Secondary | ICD-10-CM | POA: Diagnosis not present

## 2019-01-05 DIAGNOSIS — Z9181 History of falling: Secondary | ICD-10-CM

## 2019-01-05 DIAGNOSIS — R2689 Other abnormalities of gait and mobility: Secondary | ICD-10-CM | POA: Diagnosis not present

## 2019-01-05 DIAGNOSIS — R2681 Unsteadiness on feet: Secondary | ICD-10-CM | POA: Diagnosis not present

## 2019-01-05 DIAGNOSIS — R293 Abnormal posture: Secondary | ICD-10-CM

## 2019-01-05 DIAGNOSIS — M79605 Pain in left leg: Secondary | ICD-10-CM | POA: Diagnosis not present

## 2019-01-06 DIAGNOSIS — Z23 Encounter for immunization: Secondary | ICD-10-CM | POA: Diagnosis not present

## 2019-01-06 DIAGNOSIS — N186 End stage renal disease: Secondary | ICD-10-CM | POA: Diagnosis not present

## 2019-01-06 DIAGNOSIS — D509 Iron deficiency anemia, unspecified: Secondary | ICD-10-CM | POA: Diagnosis not present

## 2019-01-06 DIAGNOSIS — E1129 Type 2 diabetes mellitus with other diabetic kidney complication: Secondary | ICD-10-CM | POA: Diagnosis not present

## 2019-01-06 DIAGNOSIS — D631 Anemia in chronic kidney disease: Secondary | ICD-10-CM | POA: Diagnosis not present

## 2019-01-06 DIAGNOSIS — N2581 Secondary hyperparathyroidism of renal origin: Secondary | ICD-10-CM | POA: Diagnosis not present

## 2019-01-06 NOTE — Therapy (Signed)
[facility] [address] [place], [place], [address] Phone: [phone]   Fax:  [phone]  Physical Therapy Treatment  Patient Details  Name: Brandy Houston MRN: [id number] Date of Birth: 1956-04-01 Referring Provider (PT): Meridee Score, MD   Encounter Date: 01/05/2019   CLINIC OPERATION CHANGES: [facility] is open at lower capacity following universal masking, social distancing, and patient screening.  [name] COVID risk of complications score is 5.   PT End of Session - 01/05/19 1554    Visit Number  24   progress note /recert done at visit 19   Number of Visits  40    Date for PT Re-Evaluation  02/25/19    Authorization Type  Medicare & Medicaid    PT Start Time  9798    PT Stop Time  1550    PT Time Calculation (min)  55 min    Equipment Utilized During Treatment  Gait belt    Activity Tolerance  Patient tolerated treatment well;No increased pain;Patient limited by fatigue    Behavior During Therapy  [facility] for tasks assessed/performed       Past Medical History:  Diagnosis Date  . Anemia   . Anxiety   . Arthritis    "in my joints" (03/10/2017)  . Chronic diastolic CHF (congestive heart failure) ([place])   . CKD (chronic kidney disease), stage IV ([place])    stage IV. previous HD, none currently 10/30/15 (confirmed 02/05/2017 & 03/10/2017)  . Depression    Chronic  . Diabetic peripheral neuropathy ([place])   . Dysrhythmia    tachycardia, normal ECHO 08-09-14  . GERD (gastroesophageal reflux disease)   . Heart murmur     dx'd 02/05/2017  . Hepatitis C    "tx'd in 2016; I'm negative now" (02/05/2017)  . History of blood transfusion 2017   "w/knee replacement"  . Hypertension   . Osteoarthritis of left knee   . Peripheral neuropathy   . Protein calorie malnutrition ([place])   . Septic arthritis ([place])   . Slow transit constipation   . Type II diabetes mellitus ([name])    IDDM  . Uncontrolled  hypertension 02/18/2013  . Unsteady gait     Past Surgical History:  Procedure Laterality Date  . A/V FISTULAGRAM Right 07/07/2018   Procedure: A/V FISTULAGRAM;  Surgeon: Marty Heck, MD;  Location: [facility];  Service: Cardiovascular;  Laterality: Right;  . AMPUTATION Left 03/20/2017   Procedure: LEFT ABOVE KNEE AMPUTATION;  Surgeon: Newt Minion, MD;  Location: [facility];  Service: Orthopedics;  Laterality: Left;  . APPENDECTOMY    . AV FISTULA PLACEMENT Left 09/13/2014   Procedure: Brachial Artery to Brachial Vein Gortex Four - Seven Stretch GRAFT INSERTION Left Forearm;  Surgeon: Mal Misty, MD;  Location: [facility];  Service: Vascular;  Laterality: Left;  . BASCILIC VEIN TRANSPOSITION Right 10/26/2017   Procedure: BASILIC VEIN TRANSPOSITION FIRST STAGE RIGHT ARM;  Surgeon: Conrad Fredonia, MD;  Location: [facility];  Service: Vascular;  Laterality: Right;  . BASCILIC VEIN TRANSPOSITION Right 02/01/2018   Procedure: SECOND STAGE BASILIC VEIN TRANSPOSITION RIGHT UPPER EXTREMITY;  Surgeon: Marty Heck, MD;  Location: [facility];  Service: Vascular;  Laterality: Right;  . CHOLECYSTECTOMY OPEN    . COLON SURGERY    . EXCISIONAL TOTAL KNEE ARTHROPLASTY WITH ANTIBIOTIC SPACERS Left 02/06/2017   Procedure: Incisional total iknee with antibiotic spacer ;  Surgeon: Leandrew Koyanagi, MD;  Location: [facility]  OR;  Service: Orthopedics;  Laterality: Left;  . IR FLUORO GUIDE CV LINE RIGHT  02/10/2017  . IR REMOVAL TUN CV CATH W/O FL  04/01/2017  . IR US GUIDE VASC ACCESS RIGHT  02/10/2017  . IRRIGATION AND DEBRIDEMENT KNEE Left 03/12/2017   Procedure: IRRIGATION AND DEBRIDEMENT LEFT KNEE WITH WOUND VAC APPLICATION;  Surgeon: Leandrew Koyanagi, MD;  Location: [facility];  Service: Orthopedics;  Laterality: Left;  . JOINT REPLACEMENT    . KNEE ARTHROSCOPY Right 08/10/2014   Procedure: ARTHROSCOPY I & D KNEE;  Surgeon: Marianna Payment, MD;  Location: [facility];  Service: Orthopedics;  Laterality: Right;  . KNEE  ARTHROSCOPY Left 08/11/2014   Procedure: ARTHROSCOPIC WASHOUT LEFT KNEE;  Surgeon: Marianna Payment, MD;  Location: [facility];  Service: Orthopedics;  Laterality: Left;  . KNEE ARTHROSCOPY Left 08/19/2014   Procedure: ARTHROSCOPIC WASHOUT LEFT KNEE;  Surgeon: Leandrew Koyanagi, MD;  Location: [facility];  Service: Orthopedics;  Laterality: Left;  . KNEE ARTHROSCOPY WITH LATERAL MENISECTOMY Left 04/04/2015   Procedure: AND PARTIAL LATERAL MENISECTOMY;  Surgeon: Leandrew Koyanagi, MD;  Location: [facility];  Service: Orthopedics;  Laterality: Left;  . KNEE ARTHROSCOPY WITH MEDIAL MENISECTOMY Left 04/04/2015   Procedure: LEFT KNEE ARTHROSCOPY WITH PARTIAL MEDIAL MENISCECTOMY  AND SYNOVECTOMY;  Surgeon: Leandrew Koyanagi, MD;  Location: [facility];  Service: Orthopedics;  Laterality: Left;  . PERIPHERAL VASCULAR BALLOON ANGIOPLASTY  07/07/2018   Procedure: PERIPHERAL VASCULAR BALLOON ANGIOPLASTY;  Surgeon: Marty Heck, MD;  Location: [facility];  Service: Cardiovascular;;  right AV fistula  . SHOULDER ARTHROSCOPY Bilateral 08/10/2014   Procedure: I & D BILATERAL SHOULDERS ;  Surgeon: Marianna Payment, MD;  Location: [facility];  Service: Orthopedics;  Laterality: Bilateral;  . SMALL INTESTINE SURGERY     Due to Small Bowel Obstruction; "fixed it when they did my gallbladder OR"  . TEE WITHOUT CARDIOVERSION N/A 08/14/2014   Procedure: TRANSESOPHAGEAL ECHOCARDIOGRAM (TEE);  Surgeon: Thayer Headings, MD;  Location: [facility];  Service: Cardiovascular;  Laterality: N/A;  . TENOSYNOVECTOMY Right 08/11/2014   Procedure: RIGHT WRIST IRRIGATION AND DEBRIDEMENT, TENOSYNOVECTOMY;  Surgeon: Marianna Payment, MD;  Location: [facility];  Service: Orthopedics;  Laterality: Right;  . TOTAL KNEE ARTHROPLASTY Left 11/07/2015   Procedure: LEFT TOTAL KNEE ARTHROPLASTY WITH REVISION OF IMPLANTS;  Surgeon: Leandrew Koyanagi, MD;  Location: [facility];  Service: Orthopedics;  Laterality: Left;  . TUBAL LIGATION       Vitals:   01/05/19 1522  BP: (!) 167/78  Pulse: 90    Subjective Assessment - 01/05/19 1453    Subjective  No more falls since last PT session. She sprained her wrist but is better now. She feel 1 week ago walking backward and feels unsteady at home when has to step backwards or turns.  She noticed red eye yesterday.    Pertinent History  left TFA, DM, neuropathy, Hep C, HTN, CHF, depression, CKD st 5 not dialysis, arthritis, heart murmur,     Limitations  Standing;Walking;House hold activities    Patient Stated Goals  To walk with prosthesis in home & community, travel plane, car & cruise in September    Currently in Pain?  No/denies                       [facility] PT Treatment/Exercise - 01/05/19 1455      Transfers   Transfers  Sit  to Stand;Stand to Sit    Sit to Stand  5: Supervision;With upper extremity assist;From chair/3-in-1;With armrests   to locked rollator walker for stabilization   Stand to Sit  5: Supervision;With upper extremity assist;With armrests;To chair/3-in-1   from locked rollator walker for stability     Ambulation/Gait   Ambulation/Gait  Yes    Ambulation/Gait Assistance  5: Supervision    Ambulation/Gait Assistance Details  tactile & verbal cues on wt shift over prosthesis in stance.     Ambulation Distance (Feet)  300 Feet    Assistive device  Rollator;Prosthesis    Gait Pattern  Step-through pattern;Decreased step length - right;Decreased stance time - left;Decreased weight shift to left;Left hip hike;Antalgic;Lateral hip instability;Trunk flexed    Ambulation Surface  Indoor;Level      High Level Balance   High Level Balance Activities  Side stepping;Backward walking;Turns    High Level Balance Comments  Initially in parallel bars & ended session with carryover to rollator walker: demo, tactile & verbal cues on prosthesis control, wt shift, proper motions / step location & upright posture      Prosthetics   Prosthetic Care  Comments   need to wear prosthesis daily whether leaving house or not, When not wearing prosthesis, she has higher fall risk, less mobile/ increased sedentary can lead to deconditioning and increased energy when does move.     Current prosthetic wear tolerance (days/week)   daily since leaving house almost daily now between dialysis, PT & shopping.    Current prosthetic wear tolerance (#hours/day)   6-8 hours/day    Residual limb condition   No skin issues per pt report    Education Provided  Residual limb care;Proper wear schedule/adjustment;Proper weight-bearing schedule/adjustment    Person(s) Educated  Patient    Education Method  Explanation;Verbal cues    Education Method  Verbalized understanding;Verbal cues required;Needs further instruction    Donning Prosthesis  Modified independent (device/increased time)    Doffing Prosthesis  Modified independent (device/increased time)          Balance Exercises - 01/05/19 1455      Balance Exercises: Standing   Standing Eyes Opened  Wide ([place]);Solid surface;30 secs;Head turns;5 reps   static 30 sec & scan 4 directions intermittent UE support   Standing Eyes Closed  Wide ([place]);Solid surface;10 secs;3 reps    Other Standing Exercises  standing wide stance on floor Red Theraband reciprocal 5 reps & BUEs 5 reps rows & forward reach with MinA for balance          PT Short Term Goals - 12/27/18 1731      PT SHORT TERM GOAL #1   Title  Patient verbalizes proper prosthesis wear at dialysis including weighing in & out.  (All updated STGs Target Date: 01/28/2019)    Time  1    Period  Months    Status  New    Target Date  01/28/19      PT SHORT TERM GOAL #2   Title  patient reports prosthesis wear daily on days leaving home including dialysis for >8 hrs & on days staying home >50% of days for 4hours.  .    Time  1    Period  Months    Status  Revised    Target Date  01/28/19      PT SHORT TERM GOAL #3   Title  Patient able to safely  pick up items from floor with walker support and stands 2 minutes  without UE support with supervision.    Time  1    Period  Months    Status  Revised    Target Date  01/28/19      PT SHORT TERM GOAL #4   Title  Patient ambulates 500' outdoors with 1 seated rest for 5 minutes on paved surfaces with rollator walker & prosthesis with supervision.    Time  1    Period  Months    Status  Revised    Target Date  01/28/19      PT SHORT TERM GOAL #5   Title  Patient negotiates stairs with single rail & cane with prosthesis with supervision.    Time  1    Period  Months    Status  Revised    Target Date  01/28/19        PT Long Term Goals - 11/29/18 2153      PT LONG TERM GOAL #1   Title  Patient verbalizes & demonstrates proper prosthetic care with new prosthetic design including prosthesis use at dialysis to enable safe use of prosthesis. (All LTGs Target Date: 02/25/2019)    Time  3    Period  Months    Status  On-going    Target Date  02/25/19      PT LONG TERM GOAL #2   Title  Patient tolerates & reports prosthesis wear daily >75% of awake hours without skin issues or limb pain.     Time  3    Period  Months    Status  On-going    Target Date  02/25/19      PT LONG TERM GOAL #3   Title  Patient ambulates 500' with one seated rest <3 minutes outdoors with rollator walker & prosthesis modified independent for community mobility.     Time  3    Period  Months    Status  On-going    Target Date  02/25/19      PT LONG TERM GOAL #4   Title  Patient negotiates ramps, curbs with rollator walker & stairs 1 rail/cane & prosthesis modified independent to enable community mobility.     Time  3    Period  Months    Status  On-going    Target Date  02/25/19      PT LONG TERM GOAL #5   Title  Improve standing balance with tasks of [place] Balance Test with walker support >50/56    Time  3    Period  Months    Status  On-going    Target Date  02/25/19      Additional Long Term  Goals   Additional Long Term Goals  Yes      PT LONG TERM GOAL #6   Title  Patient ambulates 30' with cane quad tip & prosthesis with supervision.    Time  3    Period  Months    Status  New    Target Date  02/25/19            Plan - 01/05/19 1635    Clinical Impression Statement  Today's skilled session focused on movement patterns & wt shift for gait backwards, sideways & turning 90* rt/lt. PT also worked on standing balance stationary activities.    Rehab Potential  Good    PT Frequency  2x / week    PT Duration  12 weeks    PT Treatment/Interventions  ADLs/Self Care Home Management;Moist Heat;DME  Instruction;Gait training;Stair training;Functional mobility training;Therapeutic activities;Therapeutic exercise;Balance training;Neuromuscular re-education;Patient/family education;Prosthetic Training;Manual techniques;Vestibular    PT Next Visit Plan  work towads updated STGs; begin short distance gait training with cane (think pt has small base quad cane at home, confirm which cane she has)    Consulted and Agree with Plan of Care  Patient       Patient will benefit from skilled therapeutic intervention in order to improve the following deficits and impairments:  Abnormal gait, Decreased activity tolerance, Decreased balance, Decreased endurance, Decreased knowledge of use of DME, Decreased mobility, Decreased strength, Decreased range of motion, Impaired flexibility, Prosthetic Dependency, Postural dysfunction, Increased edema, Dizziness  Visit Diagnosis: 1. Other abnormalities of gait and mobility   2. Muscle weakness (generalized)   3. Abnormal posture   4. Unsteadiness on feet   5. History of falling        Problem List Patient Active Problem List   Diagnosis Date Noted  . On statin therapy due to risk of future cardiovascular event 12/10/2018  . Benign essential HTN   . Stage 5 chronic kidney disease not on chronic dialysis ([place])   . Above knee amputation status,  left   . DM type 2 causing CKD stage 5 ([place]) 03/21/2017  . Anemia of chronic disease 02/05/2017  . Dysplasia of cervix, high grade CIN 2 01/12/2017  . Chronic diastolic CHF (congestive heart failure) ([place]) 11/08/2015  . Total knee replacement status   . Diabetic peripheral neuropathy ([place])   . Major depression, recurrent, chronic ([place])   . Chronic kidney disease (CKD), stage IV (severe) ([place])   . Hepatitis C 08/16/2014  . Peripheral neuropathy 06/24/2013  . Compulsive tobacco user syndrome 06/24/2013    Jamey Reas PT, DPT 01/06/2019, 4:40 PM  [facility] [address] [place], [place], [address] Phone: [phone]   Fax:  [phone]  Name: TASHANNA DOLIN MRN: [id number] Date of Birth: 1956/06/01

## 2019-01-07 ENCOUNTER — Ambulatory Visit: Payer: Medicare Other | Admitting: Physical Therapy

## 2019-01-07 ENCOUNTER — Encounter: Payer: Self-pay | Admitting: Physical Therapy

## 2019-01-07 ENCOUNTER — Other Ambulatory Visit: Payer: Self-pay

## 2019-01-07 DIAGNOSIS — M6281 Muscle weakness (generalized): Secondary | ICD-10-CM

## 2019-01-07 DIAGNOSIS — R2681 Unsteadiness on feet: Secondary | ICD-10-CM | POA: Diagnosis not present

## 2019-01-07 DIAGNOSIS — R293 Abnormal posture: Secondary | ICD-10-CM | POA: Diagnosis not present

## 2019-01-07 DIAGNOSIS — R2689 Other abnormalities of gait and mobility: Secondary | ICD-10-CM

## 2019-01-07 DIAGNOSIS — Z9181 History of falling: Secondary | ICD-10-CM | POA: Diagnosis not present

## 2019-01-07 DIAGNOSIS — R262 Difficulty in walking, not elsewhere classified: Secondary | ICD-10-CM

## 2019-01-07 DIAGNOSIS — M79605 Pain in left leg: Secondary | ICD-10-CM | POA: Diagnosis not present

## 2019-01-08 DIAGNOSIS — N2581 Secondary hyperparathyroidism of renal origin: Secondary | ICD-10-CM | POA: Diagnosis not present

## 2019-01-08 DIAGNOSIS — N186 End stage renal disease: Secondary | ICD-10-CM | POA: Diagnosis not present

## 2019-01-08 DIAGNOSIS — E1129 Type 2 diabetes mellitus with other diabetic kidney complication: Secondary | ICD-10-CM | POA: Diagnosis not present

## 2019-01-08 DIAGNOSIS — D631 Anemia in chronic kidney disease: Secondary | ICD-10-CM | POA: Diagnosis not present

## 2019-01-08 DIAGNOSIS — D509 Iron deficiency anemia, unspecified: Secondary | ICD-10-CM | POA: Diagnosis not present

## 2019-01-08 DIAGNOSIS — Z23 Encounter for immunization: Secondary | ICD-10-CM | POA: Diagnosis not present

## 2019-01-09 NOTE — Therapy (Signed)
[facility] [address] [place], [place], [address] Phone: [phone]   Fax:  [phone]  Physical Therapy Treatment  Patient Details  Name: Brandy Houston MRN: [id number] Date of Birth: 11-20-1955 Referring Provider (PT): Meridee Score, MD   Encounter Date: 01/07/2019   CLINIC OPERATION CHANGES: [facility] is open at lower capacity following universal masking, social distancing, and patient screening.      01/07/19 1505  PT Visits / Re-Eval  Visit Number 25 (progress note /recert done at visit 19)  Number of Visits 40  Date for PT Re-Evaluation 02/25/19  Authorization  Authorization Type Medicare & Medicaid  PT Time Calculation  PT Start Time 1501  PT Stop Time 3716  PT Time Calculation (min) 43 min  PT - End of Session  Equipment Utilized During Treatment Gait belt  Activity Tolerance Patient tolerated treatment well;No increased pain;Patient limited by fatigue  Behavior During Therapy [facility] for tasks assessed/performed      Past Medical History:  Diagnosis Date  . Anemia   . Anxiety   . Arthritis    "in my joints" (03/10/2017)  . Chronic diastolic CHF (congestive heart failure) ([place])   . CKD (chronic kidney disease), stage IV ([place])    stage IV. previous HD, none currently 10/30/15 (confirmed 02/05/2017 & 03/10/2017)  . Depression    Chronic  . Diabetic peripheral neuropathy ([place])   . Dysrhythmia    tachycardia, normal ECHO 08-09-14  . GERD (gastroesophageal reflux disease)   . Heart murmur     dx'd 02/05/2017  . Hepatitis C    "tx'd in 2016; I'm negative now" (02/05/2017)  . History of blood transfusion 2017   "w/knee replacement"  . Hypertension   . Osteoarthritis of left knee   . Peripheral neuropathy   . Protein calorie malnutrition ([place])   . Septic arthritis ([place])   . Slow transit constipation   . Type II diabetes mellitus ([name])    IDDM  . Uncontrolled hypertension 02/18/2013  .  Unsteady gait     Past Surgical History:  Procedure Laterality Date  . A/V FISTULAGRAM Right 07/07/2018   Procedure: A/V FISTULAGRAM;  Surgeon: Marty Heck, MD;  Location: [facility];  Service: Cardiovascular;  Laterality: Right;  . AMPUTATION Left 03/20/2017   Procedure: LEFT ABOVE KNEE AMPUTATION;  Surgeon: Newt Minion, MD;  Location: [facility];  Service: Orthopedics;  Laterality: Left;  . APPENDECTOMY    . AV FISTULA PLACEMENT Left 09/13/2014   Procedure: Brachial Artery to Brachial Vein Gortex Four - Seven Stretch GRAFT INSERTION Left Forearm;  Surgeon: Mal Misty, MD;  Location: [facility];  Service: Vascular;  Laterality: Left;  . BASCILIC VEIN TRANSPOSITION Right 10/26/2017   Procedure: BASILIC VEIN TRANSPOSITION FIRST STAGE RIGHT ARM;  Surgeon: Conrad Heathcote, MD;  Location: [facility];  Service: Vascular;  Laterality: Right;  . BASCILIC VEIN TRANSPOSITION Right 02/01/2018   Procedure: SECOND STAGE BASILIC VEIN TRANSPOSITION RIGHT UPPER EXTREMITY;  Surgeon: Marty Heck, MD;  Location: [facility];  Service: Vascular;  Laterality: Right;  . CHOLECYSTECTOMY OPEN    . COLON SURGERY    . EXCISIONAL TOTAL KNEE ARTHROPLASTY WITH ANTIBIOTIC SPACERS Left 02/06/2017   Procedure: Incisional total iknee with antibiotic spacer ;  Surgeon: Leandrew Koyanagi, MD;  Location: [facility];  Service: Orthopedics;  Laterality: Left;  . IR FLUORO GUIDE CV LINE RIGHT  02/10/2017  . IR REMOVAL TUN CV CATH W/O FL  04/01/2017  . IR US GUIDE VASC ACCESS RIGHT  02/10/2017  . IRRIGATION AND DEBRIDEMENT KNEE Left 03/12/2017   Procedure: IRRIGATION AND DEBRIDEMENT LEFT KNEE WITH WOUND VAC APPLICATION;  Surgeon: Leandrew Koyanagi, MD;  Location: [facility];  Service: Orthopedics;  Laterality: Left;  . JOINT REPLACEMENT    . KNEE ARTHROSCOPY Right 08/10/2014   Procedure: ARTHROSCOPY I & D KNEE;  Surgeon: Marianna Payment, MD;  Location: [facility];  Service: Orthopedics;  Laterality: Right;  . KNEE ARTHROSCOPY Left 08/11/2014    Procedure: ARTHROSCOPIC WASHOUT LEFT KNEE;  Surgeon: Marianna Payment, MD;  Location: [facility];  Service: Orthopedics;  Laterality: Left;  . KNEE ARTHROSCOPY Left 08/19/2014   Procedure: ARTHROSCOPIC WASHOUT LEFT KNEE;  Surgeon: Leandrew Koyanagi, MD;  Location: [facility];  Service: Orthopedics;  Laterality: Left;  . KNEE ARTHROSCOPY WITH LATERAL MENISECTOMY Left 04/04/2015   Procedure: AND PARTIAL LATERAL MENISECTOMY;  Surgeon: Leandrew Koyanagi, MD;  Location: [facility];  Service: Orthopedics;  Laterality: Left;  . KNEE ARTHROSCOPY WITH MEDIAL MENISECTOMY Left 04/04/2015   Procedure: LEFT KNEE ARTHROSCOPY WITH PARTIAL MEDIAL MENISCECTOMY  AND SYNOVECTOMY;  Surgeon: Leandrew Koyanagi, MD;  Location: [facility];  Service: Orthopedics;  Laterality: Left;  . PERIPHERAL VASCULAR BALLOON ANGIOPLASTY  07/07/2018   Procedure: PERIPHERAL VASCULAR BALLOON ANGIOPLASTY;  Surgeon: Marty Heck, MD;  Location: [facility];  Service: Cardiovascular;;  right AV fistula  . SHOULDER ARTHROSCOPY Bilateral 08/10/2014   Procedure: I & D BILATERAL SHOULDERS ;  Surgeon: Marianna Payment, MD;  Location: [facility];  Service: Orthopedics;  Laterality: Bilateral;  . SMALL INTESTINE SURGERY     Due to Small Bowel Obstruction; "fixed it when they did my gallbladder OR"  . TEE WITHOUT CARDIOVERSION N/A 08/14/2014   Procedure: TRANSESOPHAGEAL ECHOCARDIOGRAM (TEE);  Surgeon: Thayer Headings, MD;  Location: [facility];  Service: Cardiovascular;  Laterality: N/A;  . TENOSYNOVECTOMY Right 08/11/2014   Procedure: RIGHT WRIST IRRIGATION AND DEBRIDEMENT, TENOSYNOVECTOMY;  Surgeon: Marianna Payment, MD;  Location: [facility];  Service: Orthopedics;  Laterality: Right;  . TOTAL KNEE ARTHROPLASTY Left 11/07/2015   Procedure: LEFT TOTAL KNEE ARTHROPLASTY WITH REVISION OF IMPLANTS;  Surgeon: Leandrew Koyanagi, MD;  Location: [facility];  Service: Orthopedics;  Laterality: Left;  . TUBAL LIGATION      There were no vitals filed  for this visit.      01/07/19 1504  Symptoms/Limitations  Subjective No new falls or complaints. Reports the PA at dialysis looked at her eye and said it was okay.  Pertinent History left TFA, DM, neuropathy, Hep C, HTN, CHF, depression, CKD st 5 not dialysis, arthritis, heart murmur,   Limitations Standing;Walking;House hold activities  Patient Stated Goals To walk with prosthesis in home & community, travel plane, car & cruise in September  Pain Assessment  Currently in Pain? No/denies  Pain Score 0       01/07/19 1506  Transfers  Transfers Sit to Stand;Stand to Sit  Sit to Stand 5: Supervision;With upper extremity assist;From chair/3-in-1;With armrests;4: Min guard  Sit to Stand Details Verbal cues for safe use of DME/AE;Verbal cues for precautions/safety;Verbal cues for technique  Sit to Stand Details (indicate cue type and reason) cues to use locks on rollator; cues for technique to stand with cane  Stand to Sit 5: Supervision;With upper extremity assist;With armrests;To chair/3-in-1;4: Min guard  Stand to Sit Details (indicate cue type and reason) Verbal cues for technique;Verbal cues  for precautions/safety;Verbal cues for safe use of DME/AE  Stand to Sit Details cues to use locks on rollator; cues to switch cane to left hand so to reach back with right hand with sitting down.   Ambulation/Gait  Ambulation/Gait Yes  Ambulation/Gait Assistance 5: Supervision;4: Min guard;4: Min assist  Ambulation/Gait Assistance Details supervision with rollator; min guard to min assist with cane. cues needed with sequncing and weight shifting.                  Ambulation Distance (Feet) 15 Feet (x3, 25 x 4 with cane; in/out/around gym with rollator)  Assistive device Rollator;Prosthesis;Straight cane (cane with rubber quad tip )  Gait Pattern Step-through pattern;Decreased step length - right;Decreased stance time - left;Decreased weight shift to left;Left hip hike;Antalgic;Lateral hip  instability;Trunk flexed  Ambulation Surface Level;Indoor  Pre-Gait Activities with cane in right hand, 5 laps around table to work on technique/sequencing with cane. min guard assist with cues. progressed to away from table.   High Level Balance  High Level Balance Activities Negotitating around obstacles;Backward walking (180 degree turns)  High Level Balance Comments gait around obstacles with rollator/prosthesis, then backward gait with rollator with cues/min guard assist.                           Prosthetics  Prosthetic Care Comments  saw Richardson Landry at [facility] yesterday. He ground down the socket at the groin area so it doesn't dig in. Puts the cover on on 01/19/2019.   Current prosthetic wear tolerance (days/week)  daily since leaving house almost daily now between dialysis, PT & shopping.  Current prosthetic wear tolerance (#hours/day)  6-8 hours/day  Residual limb condition  No skin issues per pt report  Donning Prosthesis 6  Doffing Prosthesis 6          PT Short Term Goals - 12/27/18 1731      PT SHORT TERM GOAL #1   Title  Patient verbalizes proper prosthesis wear at dialysis including weighing in & out.  (All updated STGs Target Date: 01/28/2019)    Time  1    Period  Months    Status  New    Target Date  01/28/19      PT SHORT TERM GOAL #2   Title  patient reports prosthesis wear daily on days leaving home including dialysis for >8 hrs & on days staying home >50% of days for 4hours.  .    Time  1    Period  Months    Status  Revised    Target Date  01/28/19      PT SHORT TERM GOAL #3   Title  Patient able to safely pick up items from floor with walker support and stands 2 minutes without UE support with supervision.    Time  1    Period  Months    Status  Revised    Target Date  01/28/19      PT SHORT TERM GOAL #4   Title  Patient ambulates 500' outdoors with 1 seated rest for 5 minutes on paved surfaces with rollator walker & prosthesis with supervision.    Time   1    Period  Months    Status  Revised    Target Date  01/28/19      PT SHORT TERM GOAL #5   Title  Patient negotiates stairs with single rail & cane with prosthesis with supervision.  Time  1    Period  Months    Status  Revised    Target Date  01/28/19        PT Long Term Goals - 11/29/18 2153      PT LONG TERM GOAL #1   Title  Patient verbalizes & demonstrates proper prosthetic care with new prosthetic design including prosthesis use at dialysis to enable safe use of prosthesis. (All LTGs Target Date: 02/25/2019)    Time  3    Period  Months    Status  On-going    Target Date  02/25/19      PT LONG TERM GOAL #2   Title  Patient tolerates & reports prosthesis wear daily >75% of awake hours without skin issues or limb pain.     Time  3    Period  Months    Status  On-going    Target Date  02/25/19      PT LONG TERM GOAL #3   Title  Patient ambulates 500' with one seated rest <3 minutes outdoors with rollator walker & prosthesis modified independent for community mobility.     Time  3    Period  Months    Status  On-going    Target Date  02/25/19      PT LONG TERM GOAL #4   Title  Patient negotiates ramps, curbs with rollator walker & stairs 1 rail/cane & prosthesis modified independent to enable community mobility.     Time  3    Period  Months    Status  On-going    Target Date  02/25/19      PT LONG TERM GOAL #5   Title  Improve standing balance with tasks of [place] Balance Test with walker support >50/56    Time  3    Period  Months    Status  On-going    Target Date  02/25/19      Additional Long Term Goals   Additional Long Term Goals  Yes      PT LONG TERM GOAL #6   Title  Patient ambulates 30' with cane quad tip & prosthesis with supervision.    Time  3    Period  Months    Status  New    Target Date  02/25/19         01/07/19 1907  Plan  Clinical Impression Statement Today's skilled session focused on gait and balance reactions with prosthesis  and Rw/cane. The pt is progressing toward goals and should benefit from continued PT to progress toward unmet goals.  Pt will benefit from skilled therapeutic intervention in order to improve on the following deficits Abnormal gait;Decreased activity tolerance;Decreased balance;Decreased endurance;Decreased knowledge of use of DME;Decreased mobility;Decreased strength;Decreased range of motion;Impaired flexibility;Prosthetic Dependency;Postural dysfunction;Increased edema;Dizziness  Rehab Potential Good  PT Frequency 2x / week  PT Duration 12 weeks  PT Treatment/Interventions ADLs/Self Care Home Management;Moist Heat;DME Instruction;Gait training;Stair training;Functional mobility training;Therapeutic activities;Therapeutic exercise;Balance training;Neuromuscular re-education;Patient/family education;Prosthetic Training;Manual techniques;Vestibular  PT Next Visit Plan work towads updated STGs; begin short distance gait training with cane (think pt has small base quad cane at home, confirm which cane she has)  Consulted and Agree with Plan of Care Patient          Patient will benefit from skilled therapeutic intervention in order to improve the following deficits and impairments:     Visit Diagnosis: 1. Other abnormalities of gait and mobility   2. Muscle weakness (generalized)  3. Abnormal posture   4. Unsteadiness on feet   5. Difficulty in walking, not elsewhere classified        Problem List Patient Active Problem List   Diagnosis Date Noted  . On statin therapy due to risk of future cardiovascular event 12/10/2018  . Benign essential HTN   . Stage 5 chronic kidney disease not on chronic dialysis ([place])   . Above knee amputation status, left   . DM type 2 causing CKD stage 5 ([place]) 03/21/2017  . Anemia of chronic disease 02/05/2017  . Dysplasia of cervix, high grade CIN 2 01/12/2017  . Chronic diastolic CHF (congestive heart failure) ([place]) 11/08/2015  . Total knee  replacement status   . Diabetic peripheral neuropathy ([place])   . Major depression, recurrent, chronic ([place])   . Chronic kidney disease (CKD), stage IV (severe) ([place])   . Hepatitis C 08/16/2014  . Peripheral neuropathy 06/24/2013  . Compulsive tobacco user syndrome 06/24/2013   Willow Ora, PTA, [facility] [address] [place], [place] [address] [phone] 01/09/19, 6:51 PM   Name: Brandy Houston MRN: [id number] Date of Birth: 12/09/1955

## 2019-01-10 ENCOUNTER — Ambulatory Visit: Payer: Medicare Other | Admitting: Physical Therapy

## 2019-01-10 ENCOUNTER — Other Ambulatory Visit: Payer: Self-pay

## 2019-01-10 ENCOUNTER — Encounter: Payer: Self-pay | Admitting: Physical Therapy

## 2019-01-10 DIAGNOSIS — R293 Abnormal posture: Secondary | ICD-10-CM

## 2019-01-10 DIAGNOSIS — R2689 Other abnormalities of gait and mobility: Secondary | ICD-10-CM

## 2019-01-10 DIAGNOSIS — M79605 Pain in left leg: Secondary | ICD-10-CM | POA: Diagnosis not present

## 2019-01-10 DIAGNOSIS — Z9181 History of falling: Secondary | ICD-10-CM

## 2019-01-10 DIAGNOSIS — R262 Difficulty in walking, not elsewhere classified: Secondary | ICD-10-CM

## 2019-01-10 DIAGNOSIS — M6281 Muscle weakness (generalized): Secondary | ICD-10-CM

## 2019-01-10 DIAGNOSIS — R2681 Unsteadiness on feet: Secondary | ICD-10-CM

## 2019-01-10 NOTE — Therapy (Signed)
[facility] [address] [place], [place], [address] Phone: [phone]   Fax:  [phone]  Physical Therapy Treatment  Patient Details  Name: Brandy Houston MRN: [id number] Date of Birth: 1955-12-14 Referring Provider (PT): Meridee Score, MD   Encounter Date: 01/10/2019  PT End of Session - 01/10/19 1555    Visit Number  26   progress note /recert done at visit 19   Number of Visits  40    Date for PT Re-Evaluation  02/25/19    Authorization Type  Medicare & Medicaid    PT Start Time  1502    PT Stop Time  1545    PT Time Calculation (min)  43 min    Equipment Utilized During Treatment  Gait belt    Activity Tolerance  Patient tolerated treatment well;No increased pain;Patient limited by fatigue    Behavior During Therapy  [facility] for tasks assessed/performed       Past Medical History:  Diagnosis Date  . Anemia   . Anxiety   . Arthritis    "in my joints" (03/10/2017)  . Chronic diastolic CHF (congestive heart failure) ([place])   . CKD (chronic kidney disease), stage IV ([place])    stage IV. previous HD, none currently 10/30/15 (confirmed 02/05/2017 & 03/10/2017)  . Depression    Chronic  . Diabetic peripheral neuropathy ([place])   . Dysrhythmia    tachycardia, normal ECHO 08-09-14  . GERD (gastroesophageal reflux disease)   . Heart murmur     dx'd 02/05/2017  . Hepatitis C    "tx'd in 2016; I'm negative now" (02/05/2017)  . History of blood transfusion 2017   "w/knee replacement"  . Hypertension   . Osteoarthritis of left knee   . Peripheral neuropathy   . Protein calorie malnutrition ([place])   . Septic arthritis ([place])   . Slow transit constipation   . Type II diabetes mellitus ([name])    IDDM  . Uncontrolled hypertension 02/18/2013  . Unsteady gait     Past Surgical History:  Procedure Laterality Date  . A/V FISTULAGRAM Right 07/07/2018   Procedure: A/V FISTULAGRAM;  Surgeon: Marty Heck, MD;  Location: [facility];  Service: Cardiovascular;  Laterality: Right;  . AMPUTATION Left 03/20/2017   Procedure: LEFT ABOVE KNEE AMPUTATION;  Surgeon: Newt Minion, MD;  Location: [facility];  Service: Orthopedics;  Laterality: Left;  . APPENDECTOMY    . AV FISTULA PLACEMENT Left 09/13/2014   Procedure: Brachial Artery to Brachial Vein Gortex Four - Seven Stretch GRAFT INSERTION Left Forearm;  Surgeon: Mal Misty, MD;  Location: [facility];  Service: Vascular;  Laterality: Left;  . BASCILIC VEIN TRANSPOSITION Right 10/26/2017   Procedure: BASILIC VEIN TRANSPOSITION FIRST STAGE RIGHT ARM;  Surgeon: Conrad [place], MD;  Location: [facility];  Service: Vascular;  Laterality: Right;  . BASCILIC VEIN TRANSPOSITION Right 02/01/2018   Procedure: SECOND STAGE BASILIC VEIN TRANSPOSITION RIGHT UPPER EXTREMITY;  Surgeon: Marty Heck, MD;  Location: [facility] [place];  Service: Vascular;  Laterality: Right;  . CHOLECYSTECTOMY OPEN    . COLON SURGERY    . EXCISIONAL TOTAL KNEE ARTHROPLASTY WITH ANTIBIOTIC SPACERS Left 02/06/2017   Procedure: Incisional total iknee with antibiotic spacer ;  Surgeon: Leandrew Koyanagi, MD;  Location: [facility];  Service: Orthopedics;  Laterality: Left;  . IR FLUORO GUIDE CV LINE RIGHT  02/10/2017  . IR REMOVAL TUN CV CATH W/O FL  04/01/2017  . IR US  GUIDE VASC ACCESS RIGHT  02/10/2017  . IRRIGATION AND DEBRIDEMENT KNEE Left 03/12/2017   Procedure: IRRIGATION AND DEBRIDEMENT LEFT KNEE WITH WOUND VAC APPLICATION;  Surgeon: Leandrew Koyanagi, MD;  Location: [facility];  Service: Orthopedics;  Laterality: Left;  . JOINT REPLACEMENT    . KNEE ARTHROSCOPY Right 08/10/2014   Procedure: ARTHROSCOPY I & D KNEE;  Surgeon: Marianna Payment, MD;  Location: [facility];  Service: Orthopedics;  Laterality: Right;  . KNEE ARTHROSCOPY Left 08/11/2014   Procedure: ARTHROSCOPIC WASHOUT LEFT KNEE;  Surgeon: Marianna Payment, MD;  Location: [facility];  Service: Orthopedics;  Laterality: Left;  . KNEE ARTHROSCOPY Left 08/19/2014   Procedure:  ARTHROSCOPIC WASHOUT LEFT KNEE;  Surgeon: Leandrew Koyanagi, MD;  Location: [place];  Service: Orthopedics;  Laterality: Left;  . KNEE ARTHROSCOPY WITH LATERAL MENISECTOMY Left 04/04/2015   Procedure: AND PARTIAL LATERAL MENISECTOMY;  Surgeon: Leandrew Koyanagi, MD;  Location: [place] [facility];  Service: Orthopedics;  Laterality: Left;  . KNEE ARTHROSCOPY WITH MEDIAL MENISECTOMY Left 04/04/2015   Procedure: LEFT KNEE ARTHROSCOPY WITH PARTIAL MEDIAL MENISCECTOMY  AND SYNOVECTOMY;  Surgeon: Leandrew Koyanagi, MD;  Location: [place];  Service: Orthopedics;  Laterality: Left;  . PERIPHERAL VASCULAR BALLOON ANGIOPLASTY  07/07/2018   Procedure: PERIPHERAL VASCULAR BALLOON ANGIOPLASTY;  Surgeon: Marty Heck, MD;  Location: [facility];  Service: Cardiovascular;;  right AV fistula  . SHOULDER ARTHROSCOPY Bilateral 08/10/2014   Procedure: I & D BILATERAL SHOULDERS ;  Surgeon: Marianna Payment, MD;  Location: [facility];  Service: Orthopedics;  Laterality: Bilateral;  . SMALL INTESTINE SURGERY     Due to Small Bowel Obstruction; "fixed it when they did my gallbladder OR"  . TEE WITHOUT CARDIOVERSION N/A 08/14/2014   Procedure: TRANSESOPHAGEAL ECHOCARDIOGRAM (TEE);  Surgeon: Thayer Headings, MD;  Location: [facility];  Service: Cardiovascular;  Laterality: N/A;  . TENOSYNOVECTOMY Right 08/11/2014   Procedure: RIGHT WRIST IRRIGATION AND DEBRIDEMENT, TENOSYNOVECTOMY;  Surgeon: Marianna Payment, MD;  Location: [facility];  Service: Orthopedics;  Laterality: Right;  . TOTAL KNEE ARTHROPLASTY Left 11/07/2015   Procedure: LEFT TOTAL KNEE ARTHROPLASTY WITH REVISION OF IMPLANTS;  Surgeon: Leandrew Koyanagi, MD;  Location: [facility];  Service: Orthopedics;  Laterality: Left;  . TUBAL LIGATION      There were no vitals filed for this visit.  Subjective Assessment - 01/10/19 1508    Subjective  "I'm feeling good today; just hot." Worked on updated HEP since last visit.    Pertinent History  left TFA, DM,  neuropathy, Hep C, HTN, CHF, depression, CKD st 5 not dialysis, arthritis, heart murmur,     Limitations  Standing;Walking;House hold activities    Patient Stated Goals  To walk with prosthesis in home & community, travel plane, car & cruise in September    Currently in Pain?  No/denies                       [facility] Adult PT Treatment/Exercise - 01/10/19 0001      Transfers   Transfers  Sit to Stand;Stand to Sit    Sit to Stand  5: Supervision;With upper extremity assist;From chair/3-in-1;With armrests;4: Min guard    Sit to Stand Details (indicate cue type and reason)  with SPC    Stand to Sit  5: Supervision;With upper extremity assist;With armrests;To chair/3-in-1;4: Min guard    Stand to Sit Details  with SPC    Stand  Pivot Transfers  5: Supervision    Stand Pivot Transfer Details (indicate cue type and reason)  wit SPC cues for safe technique.      Ambulation/Gait   Ambulation/Gait  Yes    Ambulation/Gait Assistance  4: Min guard    Ambulation/Gait Assistance Details  training with [place] with quad tip; cues for posture, step length    Ambulation Distance (Feet)  115 Feet   x2 + 25x2   Assistive device  Prosthesis;Straight cane   with quad tip   Gait Pattern  Step-through pattern;Decreased step length - right;Decreased stance time - left;Decreased weight shift to left;Left hip hike;Antalgic;Lateral hip instability;Trunk flexed    Ambulation Surface  Level;Indoor      Prosthetics   Prosthetic Care Comments   Pt has been working on standing balance.    Current prosthetic wear tolerance (days/week)   daily since leaving house almost daily now between dialysis, PT & shopping.    Current prosthetic wear tolerance (#hours/day)   6-8 hours/day    Residual limb condition   No skin issues per pt report    Education Provided  Proper weight-bearing schedule/adjustment    Person(s) Educated  Patient          Balance Exercises - 01/10/19 1540      Balance Exercises:  Standing   Standing Eyes Closed  Wide (BOA);Solid surface;10 secs;3 reps    Tandem Stance  Other (comment);Intermittent upper extremity support    Retro Gait  Upper extremity support   along counter with SPC   Sidestepping  Upper extremity support   along counter with [facility]         PT Short Term Goals - 12/27/18 1731      PT SHORT TERM GOAL #1   Title  Patient verbalizes proper prosthesis wear at dialysis including weighing in & out.  (All updated STGs Target Date: 01/28/2019)    Time  1    Period  Months    Status  New    Target Date  01/28/19      PT SHORT TERM GOAL #2   Title  patient reports prosthesis wear daily on days leaving home including dialysis for >8 hrs & on days staying home >50% of days for 4hours.  .    Time  1    Period  Months    Status  Revised    Target Date  01/28/19      PT SHORT TERM GOAL #3   Title  Patient able to safely pick up items from floor with walker support and stands 2 minutes without UE support with supervision.    Time  1    Period  Months    Status  Revised    Target Date  01/28/19      PT SHORT TERM GOAL #4   Title  Patient ambulates 500' outdoors with 1 seated rest for 5 minutes on paved surfaces with rollator walker & prosthesis with supervision.    Time  1    Period  Months    Status  Revised    Target Date  01/28/19      PT SHORT TERM GOAL #5   Title  Patient negotiates stairs with single rail & cane with prosthesis with supervision.    Time  1    Period  Months    Status  Revised    Target Date  01/28/19        PT Long Term Goals - 01/07/19 1858  PT LONG TERM GOAL #1   Title  Patient verbalizes & demonstrates proper prosthetic care with new prosthetic design including prosthesis use at dialysis to enable safe use of prosthesis. (All LTGs Target Date: 02/25/2019)    Time  3    Period  Months    Status  On-going      PT LONG TERM GOAL #2   Title  Patient tolerates & reports prosthesis wear daily >75% of awake  hours without skin issues or limb pain.     Time  3    Period  Months    Status  On-going      PT LONG TERM GOAL #3   Title  Patient ambulates 500' with one seated rest <3 minutes outdoors with rollator walker & prosthesis modified independent for community mobility.     Time  3    Period  Months    Status  On-going      PT LONG TERM GOAL #4   Title  Patient negotiates ramps, curbs with rollator walker & stairs 1 rail/cane & prosthesis modified independent to enable community mobility.     Time  3    Period  Months    Status  On-going      PT LONG TERM GOAL #5   Title  Improve standing balance with tasks of Berg Balance Test with walker support >50/56    Time  3    Period  Months    Status  On-going      PT LONG TERM GOAL #6   Title  Patient ambulates 57' with cane quad tip & prosthesis with supervision.    Time  3    Period  Months    Status  New            Plan - 01/10/19 1735    Clinical Impression Statement  Skilled session focused on on gait training with SPC and dynamic standing balance with and without SPC.  Pt made progress with increased gait distance with SPC at min guard level with cues for posutre and visual scanning.  Traning for dynamic gait/balance with SPC at counter with cues required for gait and body mechanics.  Standing balance training working on ankle /hip strategies; pt attempting decreased UE support at counter at supervision level.    Rehab Potential  Good    PT Frequency  2x / week    PT Duration  12 weeks    PT Treatment/Interventions  ADLs/Self Care Home Management;Moist Heat;DME Instruction;Gait training;Stair training;Functional mobility training;Therapeutic activities;Therapeutic exercise;Balance training;Neuromuscular re-education;Patient/family education;Prosthetic Training;Manual techniques;Vestibular    PT Next Visit Plan  work towads updated STGs; begin short distance gait training with cane (think pt has small base quad cane at home,  confirm which cane she has)    Consulted and Agree with Plan of Care  Patient       Patient will benefit from skilled therapeutic intervention in order to improve the following deficits and impairments:  Abnormal gait, Decreased activity tolerance, Decreased balance, Decreased endurance, Decreased knowledge of use of DME, Decreased mobility, Decreased strength, Decreased range of motion, Impaired flexibility, Prosthetic Dependency, Postural dysfunction, Increased edema, Dizziness  Visit Diagnosis: 1. Other abnormalities of gait and mobility   2. Muscle weakness (generalized)   3. Abnormal posture   4. Unsteadiness on feet   5. Difficulty in walking, not elsewhere classified   6. History of falling        Problem List Patient Active Problem List   Diagnosis  Date Noted  . On statin therapy due to risk of future cardiovascular event 12/10/2018  . Benign essential HTN   . Stage 5 chronic kidney disease not on chronic dialysis ([place])   . Above knee amputation status, left   . DM type 2 causing CKD stage 5 ([place]) 03/21/2017  . Anemia of chronic disease 02/05/2017  . Dysplasia of cervix, high grade CIN 2 01/12/2017  . Chronic diastolic CHF (congestive heart failure) ([place]) 11/08/2015  . Total knee replacement status   . Diabetic peripheral neuropathy ([place])   . Major depression, recurrent, chronic ([place])   . Chronic kidney disease (CKD), stage IV (severe) ([place])   . Hepatitis C 08/16/2014  . Peripheral neuropathy 06/24/2013  . Compulsive tobacco user syndrome 06/24/2013    Bjorn Loser, PTA  01/10/19, 5:45 PM [facility] [address] [place], [place], [address] Phone: [phone]   Fax:  [phone]  Name: Brandy Houston MRN: [id number] Date of Birth: 1956/05/18

## 2019-01-11 DIAGNOSIS — D509 Iron deficiency anemia, unspecified: Secondary | ICD-10-CM | POA: Diagnosis not present

## 2019-01-11 DIAGNOSIS — E1129 Type 2 diabetes mellitus with other diabetic kidney complication: Secondary | ICD-10-CM | POA: Diagnosis not present

## 2019-01-11 DIAGNOSIS — D631 Anemia in chronic kidney disease: Secondary | ICD-10-CM | POA: Diagnosis not present

## 2019-01-11 DIAGNOSIS — N186 End stage renal disease: Secondary | ICD-10-CM | POA: Diagnosis not present

## 2019-01-11 DIAGNOSIS — Z23 Encounter for immunization: Secondary | ICD-10-CM | POA: Diagnosis not present

## 2019-01-11 DIAGNOSIS — N2581 Secondary hyperparathyroidism of renal origin: Secondary | ICD-10-CM | POA: Diagnosis not present

## 2019-01-12 ENCOUNTER — Other Ambulatory Visit: Payer: Self-pay

## 2019-01-12 ENCOUNTER — Ambulatory Visit: Payer: Medicare Other | Admitting: Physical Therapy

## 2019-01-12 ENCOUNTER — Encounter: Payer: Self-pay | Admitting: Physical Therapy

## 2019-01-12 DIAGNOSIS — M79605 Pain in left leg: Secondary | ICD-10-CM

## 2019-01-12 DIAGNOSIS — M6281 Muscle weakness (generalized): Secondary | ICD-10-CM

## 2019-01-12 DIAGNOSIS — R293 Abnormal posture: Secondary | ICD-10-CM | POA: Diagnosis not present

## 2019-01-12 DIAGNOSIS — R2681 Unsteadiness on feet: Secondary | ICD-10-CM | POA: Diagnosis not present

## 2019-01-12 DIAGNOSIS — Z9181 History of falling: Secondary | ICD-10-CM

## 2019-01-12 DIAGNOSIS — R2689 Other abnormalities of gait and mobility: Secondary | ICD-10-CM | POA: Diagnosis not present

## 2019-01-12 NOTE — Therapy (Signed)
[facility] [address] [place], [place], [address] Phone: [phone]   Fax:  [phone]  Physical Therapy Treatment  Patient Details  Name: Brandy Houston MRN: [id number] Date of Birth: Oct 10, 1955 Referring Provider (PT): Meridee Score, MD   Encounter Date: 01/12/2019   CLINIC OPERATION CHANGES: [facility] is open at lower capacity following universal masking, social distancing, and patient screening.  [name] COVID risk of complications score is 5.   PT End of Session - 01/12/19 1549    Visit Number  27   progress note /recert done at visit 19   Number of Visits  40    Date for PT Re-Evaluation  02/25/19    Authorization Type  Medicare & Medicaid    PT Start Time  1500    PT Stop Time  1545    PT Time Calculation (min)  45 min    Equipment Utilized During Treatment  Gait belt    Activity Tolerance  Patient tolerated treatment well;No increased pain;Patient limited by fatigue    Behavior During Therapy  [facility] for tasks assessed/performed       Past Medical History:  Diagnosis Date  . Anemia   . Anxiety   . Arthritis    "in my joints" (03/10/2017)  . Chronic diastolic CHF (congestive heart failure) ([place])   . CKD (chronic kidney disease), stage IV ([place])    stage IV. previous HD, none currently 10/30/15 (confirmed 02/05/2017 & 03/10/2017)  . Depression    Chronic  . Diabetic peripheral neuropathy ([place])   . Dysrhythmia    tachycardia, normal ECHO 08-09-14  . GERD (gastroesophageal reflux disease)   . Heart murmur     dx'd 02/05/2017  . Hepatitis C    "tx'd in 2016; I'm negative now" (02/05/2017)  . History of blood transfusion 2017   "w/knee replacement"  . Hypertension   . Osteoarthritis of left knee   . Peripheral neuropathy   . Protein calorie malnutrition ([place])   . Septic arthritis ([place])   . Slow transit constipation   . Type II diabetes mellitus ([name])    IDDM  . Uncontrolled  hypertension 02/18/2013  . Unsteady gait     Past Surgical History:  Procedure Laterality Date  . A/V FISTULAGRAM Right 07/07/2018   Procedure: A/V FISTULAGRAM;  Surgeon: Marty Heck, MD;  Location: [facility];  Service: Cardiovascular;  Laterality: Right;  . AMPUTATION Left 03/20/2017   Procedure: LEFT ABOVE KNEE AMPUTATION;  Surgeon: Newt Minion, MD;  Location: [facility];  Service: Orthopedics;  Laterality: Left;  . APPENDECTOMY    . AV FISTULA PLACEMENT Left 09/13/2014   Procedure: Brachial Artery to Brachial Vein Gortex Four - Seven Stretch GRAFT INSERTION Left Forearm;  Surgeon: Mal Misty, MD;  Location: [facility];  Service: Vascular;  Laterality: Left;  . BASCILIC VEIN TRANSPOSITION Right 10/26/2017   Procedure: BASILIC VEIN TRANSPOSITION FIRST STAGE RIGHT ARM;  Surgeon: Conrad , MD;  Location: [facility];  Service: Vascular;  Laterality: Right;  . BASCILIC VEIN TRANSPOSITION Right 02/01/2018   Procedure: SECOND STAGE BASILIC VEIN TRANSPOSITION RIGHT UPPER EXTREMITY;  Surgeon: Marty Heck, MD;  Location: [facility];  Service: Vascular;  Laterality: Right;  . CHOLECYSTECTOMY OPEN    . COLON SURGERY    . EXCISIONAL TOTAL KNEE ARTHROPLASTY WITH ANTIBIOTIC SPACERS Left 02/06/2017   Procedure: Incisional total iknee with antibiotic spacer ;  Surgeon: Leandrew Koyanagi, MD;  Location: [facility];  Service: Orthopedics;  Laterality: Left;  . IR FLUORO GUIDE CV LINE RIGHT  02/10/2017  . IR REMOVAL TUN CV CATH W/O FL  04/01/2017  . IR US GUIDE VASC ACCESS RIGHT  02/10/2017  . IRRIGATION AND DEBRIDEMENT KNEE Left 03/12/2017   Procedure: IRRIGATION AND DEBRIDEMENT LEFT KNEE WITH WOUND VAC APPLICATION;  Surgeon: Leandrew Koyanagi, MD;  Location: [facility];  Service: Orthopedics;  Laterality: Left;  . JOINT REPLACEMENT    . KNEE ARTHROSCOPY Right 08/10/2014   Procedure: ARTHROSCOPY I & D KNEE;  Surgeon: Marianna Payment, MD;  Location: [facility];  Service: Orthopedics;  Laterality: Right;  . KNEE  ARTHROSCOPY Left 08/11/2014   Procedure: ARTHROSCOPIC WASHOUT LEFT KNEE;  Surgeon: Marianna Payment, MD;  Location: [facility];  Service: Orthopedics;  Laterality: Left;  . KNEE ARTHROSCOPY Left 08/19/2014   Procedure: ARTHROSCOPIC WASHOUT LEFT KNEE;  Surgeon: Leandrew Koyanagi, MD;  Location: [facility];  Service: Orthopedics;  Laterality: Left;  . KNEE ARTHROSCOPY WITH LATERAL MENISECTOMY Left 04/04/2015   Procedure: AND PARTIAL LATERAL MENISECTOMY;  Surgeon: Leandrew Koyanagi, MD;  Location: [facility];  Service: Orthopedics;  Laterality: Left;  . KNEE ARTHROSCOPY WITH MEDIAL MENISECTOMY Left 04/04/2015   Procedure: LEFT KNEE ARTHROSCOPY WITH PARTIAL MEDIAL MENISCECTOMY  AND SYNOVECTOMY;  Surgeon: Leandrew Koyanagi, MD;  Location: [facility];  Service: Orthopedics;  Laterality: Left;  . PERIPHERAL VASCULAR BALLOON ANGIOPLASTY  07/07/2018   Procedure: PERIPHERAL VASCULAR BALLOON ANGIOPLASTY;  Surgeon: Marty Heck, MD;  Location: [facility];  Service: Cardiovascular;;  right AV fistula  . SHOULDER ARTHROSCOPY Bilateral 08/10/2014   Procedure: I & D BILATERAL SHOULDERS ;  Surgeon: Marianna Payment, MD;  Location: [facility];  Service: Orthopedics;  Laterality: Bilateral;  . SMALL INTESTINE SURGERY     Due to Small Bowel Obstruction; "fixed it when they did my gallbladder OR"  . TEE WITHOUT CARDIOVERSION N/A 08/14/2014   Procedure: TRANSESOPHAGEAL ECHOCARDIOGRAM (TEE);  Surgeon: Thayer Headings, MD;  Location: [facility];  Service: Cardiovascular;  Laterality: N/A;  . TENOSYNOVECTOMY Right 08/11/2014   Procedure: RIGHT WRIST IRRIGATION AND DEBRIDEMENT, TENOSYNOVECTOMY;  Surgeon: Marianna Payment, MD;  Location: [facility];  Service: Orthopedics;  Laterality: Right;  . TOTAL KNEE ARTHROPLASTY Left 11/07/2015   Procedure: LEFT TOTAL KNEE ARTHROPLASTY WITH REVISION OF IMPLANTS;  Surgeon: Leandrew Koyanagi, MD;  Location: [facility];  Service: Orthopedics;  Laterality: Left;  . TUBAL LIGATION       There were no vitals filed for this visit.  Subjective Assessment - 01/12/19 1458    Subjective  No falls. She has been working on walking backwards but is fearful.    Pertinent History  left TFA, DM, neuropathy, Hep C, HTN, CHF, depression, CKD st 5 not dialysis, arthritis, heart murmur,     Limitations  Standing;Walking;House hold activities    Patient Stated Goals  To walk with prosthesis in home & community, travel plane, car & cruise in September    Currently in Pain?  No/denies                       [facility] Adult PT Treatment/Exercise - 01/12/19 1500      Transfers   Transfers  Sit to Stand;Stand to Sit    Sit to Stand  5: Supervision;With upper extremity assist;From chair/3-in-1;With armrests   to cane   Sit to Stand Details (indicate cue type and reason)  cues on moving cane to LUE for sit /stand;  standing from chairs with armrests working on not touching sturdy object (close for safety) and sit/standing chairs without armrests touching locked rollator walker     Stand to Sit  5: Supervision;With upper extremity assist;With armrests;To chair/3-in-1   from cane   Stand to Sit Details  cues on moving cane to LUE for sit /stand;  standing from chairs with armrests working on not touching sturdy object (close for safety) and sit/standing chairs without armrests touching locked rollator walker       Ambulation/Gait   Ambulation/Gait  Yes    Ambulation/Gait Assistance  4: Min assist;4: Min guard    Ambulation/Gait Assistance Details  worked with various canes with quad tip to help determine which design she wants to purchase.  Tactile cues on upright posture, wt shift and turning    Ambulation Distance (Feet)  125 Feet   125' X 3   Assistive device  Prosthesis;Straight cane   with quad tip   Gait Pattern  Step-through pattern;Decreased step length - right;Decreased stance time - left;Decreased weight shift to left;Left hip hike;Antalgic;Lateral hip  instability;Trunk flexed    Ambulation Surface  Indoor;Level    Stairs  Yes    Stairs Assistance  5: Supervision    Stair Management Technique  One rail Right;Step to pattern;With cane;Forwards    Number of Stairs  4      High Level Balance   High Level Balance Activities  Side stepping;Backward walking;Turns    High Level Balance Comments  demo & verbal cues on technique with TFA prosthesis      Prosthetics   Prosthetic Care Comments   --    Current prosthetic wear tolerance (days/week)   daily since leaving house almost daily now between dialysis, PT & shopping.    Current prosthetic wear tolerance (#hours/day)   6-8 hours/day    Residual limb condition   No skin issues per pt report    Education Provided  Proper weight-bearing schedule/adjustment    Person(s) Educated  Patient    Education Method  Explanation;Verbal cues    Education Method  Verbalized understanding;Needs further instruction          Balance Exercises - 01/12/19 1500      Balance Exercises: Standing   Standing Eyes Opened  Wide ([place]);Head turns;Solid surface;5 reps   5 reps 4 directions   Standing Eyes Closed  Narrow base of support (BOS);Solid surface;10 secs;3 reps    Tandem Stance  Eyes open;2 reps;15 secs   straddle step stance with 2-4" between feet         PT Short Term Goals - 12/27/18 1731      PT SHORT TERM GOAL #1   Title  Patient verbalizes proper prosthesis wear at dialysis including weighing in & out.  (All updated STGs Target Date: 01/28/2019)    Time  1    Period  Months    Status  New    Target Date  01/28/19      PT SHORT TERM GOAL #2   Title  patient reports prosthesis wear daily on days leaving home including dialysis for >8 hrs & on days staying home >50% of days for 4hours.  .    Time  1    Period  Months    Status  Revised    Target Date  01/28/19      PT SHORT TERM GOAL #3   Title  Patient able to safely  pick up items from floor with walker support and stands 2 minutes  without UE support with supervision.    Time  1    Period  Months    Status  Revised    Target Date  01/28/19      PT SHORT TERM GOAL #4   Title  Patient ambulates 500' outdoors with 1 seated rest for 5 minutes on paved surfaces with rollator walker & prosthesis with supervision.    Time  1    Period  Months    Status  Revised    Target Date  01/28/19      PT SHORT TERM GOAL #5   Title  Patient negotiates stairs with single rail & cane with prosthesis with supervision.    Time  1    Period  Months    Status  Revised    Target Date  01/28/19        PT Long Term Goals - 01/07/19 1858      PT LONG TERM GOAL #1   Title  Patient verbalizes & demonstrates proper prosthetic care with new prosthetic design including prosthesis use at dialysis to enable safe use of prosthesis. (All LTGs Target Date: 02/25/2019)    Time  3    Period  Months    Status  On-going      PT LONG TERM GOAL #2   Title  Patient tolerates & reports prosthesis wear daily >75% of awake hours without skin issues or limb pain.     Time  3    Period  Months    Status  On-going      PT LONG TERM GOAL #3   Title  Patient ambulates 500' with one seated rest <3 minutes outdoors with rollator walker & prosthesis modified independent for community mobility.     Time  3    Period  Months    Status  On-going      PT LONG TERM GOAL #4   Title  Patient negotiates ramps, curbs with rollator walker & stairs 1 rail/cane & prosthesis modified independent to enable community mobility.     Time  3    Period  Months    Status  On-going      PT LONG TERM GOAL #5   Title  Improve standing balance with tasks of Berg Balance Test with walker support >50/56    Time  3    Period  Months    Status  On-going      PT LONG TERM GOAL #6   Title  Patient ambulates 81' with cane quad tip & prosthesis with supervision.    Time  3    Period  Months    Status  New            Plan - 01/12/19 2248    Clinical Impression  Statement  Today's session focused on prosthetic gait with cane quad tip negotiating around furniture and scanning while ambulating & sit/stand.  PT instructed in walking backwards with rollator walker.    Rehab Potential  Good    PT Frequency  2x / week    PT Duration  12 weeks    PT Treatment/Interventions  ADLs/Self Care Home Management;Moist Heat;DME Instruction;Gait training;Stair training;Functional mobility training;Therapeutic activities;Therapeutic exercise;Balance training;Neuromuscular re-education;Patient/family education;Prosthetic Training;Manual techniques;Vestibular    PT Next Visit Plan  work towards STGs, prosthetic gait & balance activities    Consulted and Agree with Plan of Care  Patient  Patient will benefit from skilled therapeutic intervention in order to improve the following deficits and impairments:  Abnormal gait, Decreased activity tolerance, Decreased balance, Decreased endurance, Decreased knowledge of use of DME, Decreased mobility, Decreased strength, Decreased range of motion, Impaired flexibility, Prosthetic Dependency, Postural dysfunction, Increased edema, Dizziness  Visit Diagnosis: 1. Other abnormalities of gait and mobility   2. Muscle weakness (generalized)   3. Abnormal posture   4. Unsteadiness on feet   5. History of falling   6. Pain in left leg        Problem List Patient Active Problem List   Diagnosis Date Noted  . On statin therapy due to risk of future cardiovascular event 12/10/2018  . Benign essential HTN   . Stage 5 chronic kidney disease not on chronic dialysis ([place])   . Above knee amputation status, left   . DM type 2 causing CKD stage 5 ([place]) 03/21/2017  . Anemia of chronic disease 02/05/2017  . Dysplasia of cervix, high grade CIN 2 01/12/2017  . Chronic diastolic CHF (congestive heart failure) ([place]) 11/08/2015  . Total knee replacement status   . Diabetic peripheral neuropathy ([place])   . Major depression, recurrent,  chronic ([place])   . Chronic kidney disease (CKD), stage IV (severe) ([place])   . Hepatitis C 08/16/2014  . Peripheral neuropathy 06/24/2013  . Compulsive tobacco user syndrome 06/24/2013    Jamey Reas PT, DPT 01/12/2019, 10:54 PM  [facility] [address] [place], [place], [address] Phone: [phone]   Fax:  [phone]  Name: JAQUANNA BALLENTINE MRN: [id number] Date of Birth: 27-Aug-1955

## 2019-01-13 DIAGNOSIS — E1129 Type 2 diabetes mellitus with other diabetic kidney complication: Secondary | ICD-10-CM | POA: Diagnosis not present

## 2019-01-13 DIAGNOSIS — D631 Anemia in chronic kidney disease: Secondary | ICD-10-CM | POA: Diagnosis not present

## 2019-01-13 DIAGNOSIS — Z23 Encounter for immunization: Secondary | ICD-10-CM | POA: Diagnosis not present

## 2019-01-13 DIAGNOSIS — N186 End stage renal disease: Secondary | ICD-10-CM | POA: Diagnosis not present

## 2019-01-13 DIAGNOSIS — N2581 Secondary hyperparathyroidism of renal origin: Secondary | ICD-10-CM | POA: Diagnosis not present

## 2019-01-13 DIAGNOSIS — D509 Iron deficiency anemia, unspecified: Secondary | ICD-10-CM | POA: Diagnosis not present

## 2019-01-15 DIAGNOSIS — N186 End stage renal disease: Secondary | ICD-10-CM | POA: Diagnosis not present

## 2019-01-15 DIAGNOSIS — Z992 Dependence on renal dialysis: Secondary | ICD-10-CM | POA: Diagnosis not present

## 2019-01-15 DIAGNOSIS — Z23 Encounter for immunization: Secondary | ICD-10-CM | POA: Diagnosis not present

## 2019-01-15 DIAGNOSIS — D509 Iron deficiency anemia, unspecified: Secondary | ICD-10-CM | POA: Diagnosis not present

## 2019-01-15 DIAGNOSIS — E1129 Type 2 diabetes mellitus with other diabetic kidney complication: Secondary | ICD-10-CM | POA: Diagnosis not present

## 2019-01-15 DIAGNOSIS — N2581 Secondary hyperparathyroidism of renal origin: Secondary | ICD-10-CM | POA: Diagnosis not present

## 2019-01-18 DIAGNOSIS — Z23 Encounter for immunization: Secondary | ICD-10-CM | POA: Diagnosis not present

## 2019-01-18 DIAGNOSIS — N186 End stage renal disease: Secondary | ICD-10-CM | POA: Diagnosis not present

## 2019-01-18 DIAGNOSIS — D509 Iron deficiency anemia, unspecified: Secondary | ICD-10-CM | POA: Diagnosis not present

## 2019-01-18 DIAGNOSIS — N2581 Secondary hyperparathyroidism of renal origin: Secondary | ICD-10-CM | POA: Diagnosis not present

## 2019-01-18 DIAGNOSIS — Z992 Dependence on renal dialysis: Secondary | ICD-10-CM | POA: Diagnosis not present

## 2019-01-18 DIAGNOSIS — E1129 Type 2 diabetes mellitus with other diabetic kidney complication: Secondary | ICD-10-CM | POA: Diagnosis not present

## 2019-01-20 DIAGNOSIS — E1129 Type 2 diabetes mellitus with other diabetic kidney complication: Secondary | ICD-10-CM | POA: Diagnosis not present

## 2019-01-20 DIAGNOSIS — N2581 Secondary hyperparathyroidism of renal origin: Secondary | ICD-10-CM | POA: Diagnosis not present

## 2019-01-20 DIAGNOSIS — Z992 Dependence on renal dialysis: Secondary | ICD-10-CM | POA: Diagnosis not present

## 2019-01-20 DIAGNOSIS — Z23 Encounter for immunization: Secondary | ICD-10-CM | POA: Diagnosis not present

## 2019-01-20 DIAGNOSIS — D509 Iron deficiency anemia, unspecified: Secondary | ICD-10-CM | POA: Diagnosis not present

## 2019-01-20 DIAGNOSIS — N186 End stage renal disease: Secondary | ICD-10-CM | POA: Diagnosis not present

## 2019-01-22 DIAGNOSIS — D509 Iron deficiency anemia, unspecified: Secondary | ICD-10-CM | POA: Diagnosis not present

## 2019-01-22 DIAGNOSIS — N2581 Secondary hyperparathyroidism of renal origin: Secondary | ICD-10-CM | POA: Diagnosis not present

## 2019-01-22 DIAGNOSIS — N186 End stage renal disease: Secondary | ICD-10-CM | POA: Diagnosis not present

## 2019-01-22 DIAGNOSIS — Z23 Encounter for immunization: Secondary | ICD-10-CM | POA: Diagnosis not present

## 2019-01-22 DIAGNOSIS — E1129 Type 2 diabetes mellitus with other diabetic kidney complication: Secondary | ICD-10-CM | POA: Diagnosis not present

## 2019-01-22 DIAGNOSIS — Z992 Dependence on renal dialysis: Secondary | ICD-10-CM | POA: Diagnosis not present

## 2019-01-24 ENCOUNTER — Encounter: Payer: Self-pay | Admitting: Physical Therapy

## 2019-01-24 ENCOUNTER — Ambulatory Visit: Payer: Medicare Other | Attending: Orthopedic Surgery | Admitting: Physical Therapy

## 2019-01-24 ENCOUNTER — Other Ambulatory Visit: Payer: Self-pay

## 2019-01-24 DIAGNOSIS — R262 Difficulty in walking, not elsewhere classified: Secondary | ICD-10-CM | POA: Diagnosis not present

## 2019-01-24 DIAGNOSIS — R293 Abnormal posture: Secondary | ICD-10-CM | POA: Insufficient documentation

## 2019-01-24 DIAGNOSIS — M6281 Muscle weakness (generalized): Secondary | ICD-10-CM | POA: Diagnosis not present

## 2019-01-24 DIAGNOSIS — Z9181 History of falling: Secondary | ICD-10-CM

## 2019-01-24 DIAGNOSIS — R2681 Unsteadiness on feet: Secondary | ICD-10-CM | POA: Diagnosis not present

## 2019-01-24 DIAGNOSIS — M79605 Pain in left leg: Secondary | ICD-10-CM | POA: Diagnosis not present

## 2019-01-24 DIAGNOSIS — R2689 Other abnormalities of gait and mobility: Secondary | ICD-10-CM

## 2019-01-24 NOTE — Therapy (Signed)
[facility] [address] [place], [place], [address] Phone: [phone]   Fax:  [phone]  Physical Therapy Treatment  Patient Details  Name: Brandy Houston MRN: [id number] Date of Birth: 07-Jun-1956 Referring Provider (PT): Meridee Score, MD   Encounter Date: 01/24/2019  PT End of Session - 01/24/19 1232    Visit Number  28   progress note /recert done at visit 19   Number of Visits  40    Date for PT Re-Evaluation  02/25/19    Authorization Type  Medicare & Medicaid    PT Start Time  1150    PT Stop Time  1228    PT Time Calculation (min)  38 min    Activity Tolerance  Patient tolerated treatment well    Behavior During Therapy  [facility] for tasks assessed/performed       Past Medical History:  Diagnosis Date  . Anemia   . Anxiety   . Arthritis    "in my joints" (03/10/2017)  . Chronic diastolic CHF (congestive heart failure) ([place])   . CKD (chronic kidney disease), stage IV ([place])    stage IV. previous HD, none currently 10/30/15 (confirmed 02/05/2017 & 03/10/2017)  . Depression    Chronic  . Diabetic peripheral neuropathy ([place])   . Dysrhythmia    tachycardia, normal ECHO 08-09-14  . GERD (gastroesophageal reflux disease)   . Heart murmur     dx'd 02/05/2017  . Hepatitis C    "tx'd in 2016; I'm negative now" (02/05/2017)  . History of blood transfusion 2017   "w/knee replacement"  . Hypertension   . Osteoarthritis of left knee   . Peripheral neuropathy   . Protein calorie malnutrition ([place])   . Septic arthritis ([place])   . Slow transit constipation   . Type II diabetes mellitus ([name])    IDDM  . Uncontrolled hypertension 02/18/2013  . Unsteady gait     Past Surgical History:  Procedure Laterality Date  . A/V FISTULAGRAM Right 07/07/2018   Procedure: A/V FISTULAGRAM;  Surgeon: Marty Heck, MD;  Location: [facility];  Service: Cardiovascular;  Laterality: Right;  . AMPUTATION Left 03/20/2017    Procedure: LEFT ABOVE KNEE AMPUTATION;  Surgeon: Newt Minion, MD;  Location: [facility];  Service: Orthopedics;  Laterality: Left;  . APPENDECTOMY    . AV FISTULA PLACEMENT Left 09/13/2014   Procedure: Brachial Artery to Brachial Vein Gortex Four - Seven Stretch GRAFT INSERTION Left Forearm;  Surgeon: Mal Misty, MD;  Location: [facility];  Service: Vascular;  Laterality: Left;  . BASCILIC VEIN TRANSPOSITION Right 10/26/2017   Procedure: BASILIC VEIN TRANSPOSITION FIRST STAGE RIGHT ARM;  Surgeon: Conrad [place], MD;  Location: [facility];  Service: Vascular;  Laterality: Right;  . BASCILIC VEIN TRANSPOSITION Right 02/01/2018   Procedure: SECOND STAGE BASILIC VEIN TRANSPOSITION RIGHT UPPER EXTREMITY;  Surgeon: Marty Heck, MD;  Location: [facility];  Service: Vascular;  Laterality: Right;  . CHOLECYSTECTOMY OPEN    . COLON SURGERY    . EXCISIONAL TOTAL KNEE ARTHROPLASTY WITH ANTIBIOTIC SPACERS Left 02/06/2017   Procedure: Incisional total iknee with antibiotic spacer ;  Surgeon: Leandrew Koyanagi, MD;  Location: [facility];  Service: Orthopedics;  Laterality: Left;  . IR FLUORO GUIDE CV LINE RIGHT  02/10/2017  . IR REMOVAL TUN CV CATH W/O FL  04/01/2017  . IR US GUIDE VASC ACCESS RIGHT  02/10/2017  . IRRIGATION AND DEBRIDEMENT KNEE Left 03/12/2017  Procedure: IRRIGATION AND DEBRIDEMENT LEFT KNEE WITH WOUND VAC APPLICATION;  Surgeon: Leandrew Koyanagi, MD;  Location: [facility];  Service: Orthopedics;  Laterality: Left;  . JOINT REPLACEMENT    . KNEE ARTHROSCOPY Right 08/10/2014   Procedure: ARTHROSCOPY I & D KNEE;  Surgeon: Marianna Payment, MD;  Location: [facility];  Service: Orthopedics;  Laterality: Right;  . KNEE ARTHROSCOPY Left 08/11/2014   Procedure: ARTHROSCOPIC WASHOUT LEFT KNEE;  Surgeon: Marianna Payment, MD;  Location: [facility];  Service: Orthopedics;  Laterality: Left;  . KNEE ARTHROSCOPY Left 08/19/2014   Procedure: ARTHROSCOPIC WASHOUT LEFT KNEE;  Surgeon: Leandrew Koyanagi, MD;  Location: [facility];  Service: Orthopedics;   Laterality: Left;  . KNEE ARTHROSCOPY WITH LATERAL MENISECTOMY Left 04/04/2015   Procedure: AND PARTIAL LATERAL MENISECTOMY;  Surgeon: Leandrew Koyanagi, MD;  Location: [facility];  Service: Orthopedics;  Laterality: Left;  . KNEE ARTHROSCOPY WITH MEDIAL MENISECTOMY Left 04/04/2015   Procedure: LEFT KNEE ARTHROSCOPY WITH PARTIAL MEDIAL MENISCECTOMY  AND SYNOVECTOMY;  Surgeon: Leandrew Koyanagi, MD;  Location: [facility];  Service: Orthopedics;  Laterality: Left;  . PERIPHERAL VASCULAR BALLOON ANGIOPLASTY  07/07/2018   Procedure: PERIPHERAL VASCULAR BALLOON ANGIOPLASTY;  Surgeon: Marty Heck, MD;  Location: [facility];  Service: Cardiovascular;;  right AV fistula  . SHOULDER ARTHROSCOPY Bilateral 08/10/2014   Procedure: I & D BILATERAL SHOULDERS ;  Surgeon: Marianna Payment, MD;  Location: [facility];  Service: Orthopedics;  Laterality: Bilateral;  . SMALL INTESTINE SURGERY     Due to Small Bowel Obstruction; "fixed it when they did my gallbladder OR"  . TEE WITHOUT CARDIOVERSION N/A 08/14/2014   Procedure: TRANSESOPHAGEAL ECHOCARDIOGRAM (TEE);  Surgeon: Thayer Headings, MD;  Location: [facility];  Service: Cardiovascular;  Laterality: N/A;  . TENOSYNOVECTOMY Right 08/11/2014   Procedure: RIGHT WRIST IRRIGATION AND DEBRIDEMENT, TENOSYNOVECTOMY;  Surgeon: Marianna Payment, MD;  Location: [facility];  Service: Orthopedics;  Laterality: Right;  . TOTAL KNEE ARTHROPLASTY Left 11/07/2015   Procedure: LEFT TOTAL KNEE ARTHROPLASTY WITH REVISION OF IMPLANTS;  Surgeon: Leandrew Koyanagi, MD;  Location: [facility];  Service: Orthopedics;  Laterality: Left;  . TUBAL LIGATION      There were no vitals filed for this visit.  Subjective Assessment - 01/24/19 1155    Subjective  Pt had dialysis on Saturday.  No issues to report.  Didn't bring her cane in today.    Pertinent History  left TFA, DM, neuropathy, Hep C, HTN, CHF, depression, CKD st 5 not dialysis, arthritis, heart murmur,      Limitations  Standing;Walking;House hold activities    Patient Stated Goals  To walk with prosthesis in home & community, travel plane, car & cruise in September    Currently in Pain?  No/denies          Prosthetics Assessment - 01/24/19 1239      Prosthetics   Current prosthetic wear tolerance (days/week)   daily    Current prosthetic wear tolerance (#hours/day)   8+ hours a day    Residual limb condition   No skin issues per pt report                  [facility] Adult PT Treatment/Exercise - 01/24/19 1239      Transfers   Transfers  Sit to Stand;Stand to [employer] Transfers    Sit to Stand  5: Supervision;6: Modified independent (Device/Increase time)    Sit to  Stand Details (indicate cue type and reason)  Mod I with rollator; still requires verbal cues to stand with cane in LUE and to balance when transitioning cane to RUE prior to walking    Stand to Sit  4: Min assist;6: Modified independent (Device/Increase time)    Stand to Sit Details  Mod I with rollator; min A when using cane; requires min A to maintain balance when switching cane back to LUE to reach back with RUE for seat    Stand Pivot Transfers  6: Modified independent (Device/Increase time);4: Min guard    Stand Pivot Transfer Details (indicate cue type and reason)  MOD I with rollator; with cane requires cues to pivot fully as pt tends to compensate with trunk flexion reaching for mat with LUE and sitting before fully pivoting      Ambulation/Gait   Ambulation/Gait  Yes    Ambulation/Gait Assistance  4: Min assist    Ambulation/Gait Assistance Details  first with Hurrycane and then back to quad tip cane due to feeling more unsteady with Hurrycane    Ambulation Distance (Feet)  115 Feet    Assistive device  Prosthesis;Straight cane    Gait Pattern  Step-through pattern;Decreased step length - right;Decreased stance time - left;Decreased stride length;Decreased hip/knee flexion - left;Trendelenburg;Poor  foot clearance - left    Stairs  Yes    Stairs Assistance  4: Min guard    Stairs Assistance Details (indicate cue type and reason)  pt able to recall safe stair negotiation sequence    Stair Management Technique  One rail Left;Step to pattern;Forwards;With cane    Number of Stairs  8    Height of Stairs  6      High Level Balance   High Level Balance Activities  Backward walking    High Level Balance Comments  in // bars with therapist providing min A  at trunk to maintain more upright trunk while stepping backwards and cues for full weight shift to LLE to allow full step length RLE.  Performed 4 laps in // bars; cued pt to allow hands to slide along bars when stepping backwards.  Performed one set of walking backwards with rollator with therapist providing min A to shift weight fully on to LLE and bring rollator back during weight shift to allow full step with RLE      Knee/Hip Exercises: Standing   Hip Extension  Stengthening;Right;Left;2 sets;10 reps;Knee straight    Extension Limitations  in // bars; second set combined with stepping backwards to increase retro step length             PT Education - 01/24/19 1242    Education provided  Yes    Education Details  retro walking, gait with cane, stairs, transfers with cane    Person(s) Educated  Patient    Methods  Explanation;Demonstration    Comprehension  Verbalized understanding;Returned demonstration       PT Short Term Goals - 12/27/18 1731      PT SHORT TERM GOAL #1   Title  Patient verbalizes proper prosthesis wear at dialysis including weighing in & out.  (All updated STGs Target Date: 01/28/2019)    Time  1    Period  Months    Status  New    Target Date  01/28/19      PT SHORT TERM GOAL #2   Title  patient reports prosthesis wear daily on days leaving home including dialysis for >8 hrs & on  days staying home >50% of days for 4hours.  .    Time  1    Period  Months    Status  Revised    Target Date  01/28/19       PT SHORT TERM GOAL #3   Title  Patient able to safely pick up items from floor with walker support and stands 2 minutes without UE support with supervision.    Time  1    Period  Months    Status  Revised    Target Date  01/28/19      PT SHORT TERM GOAL #4   Title  Patient ambulates 500' outdoors with 1 seated rest for 5 minutes on paved surfaces with rollator walker & prosthesis with supervision.    Time  1    Period  Months    Status  Revised    Target Date  01/28/19      PT SHORT TERM GOAL #5   Title  Patient negotiates stairs with single rail & cane with prosthesis with supervision.    Time  1    Period  Months    Status  Revised    Target Date  01/28/19        PT Long Term Goals - 01/07/19 1858      PT LONG TERM GOAL #1   Title  Patient verbalizes & demonstrates proper prosthetic care with new prosthetic design including prosthesis use at dialysis to enable safe use of prosthesis. (All LTGs Target Date: 02/25/2019)    Time  3    Period  Months    Status  On-going      PT LONG TERM GOAL #2   Title  Patient tolerates & reports prosthesis wear daily >75% of awake hours without skin issues or limb pain.     Time  3    Period  Months    Status  On-going      PT LONG TERM GOAL #3   Title  Patient ambulates 500' with one seated rest <3 minutes outdoors with rollator walker & prosthesis modified independent for community mobility.     Time  3    Period  Months    Status  On-going      PT LONG TERM GOAL #4   Title  Patient negotiates ramps, curbs with rollator walker & stairs 1 rail/cane & prosthesis modified independent to enable community mobility.     Time  3    Period  Months    Status  On-going      PT LONG TERM GOAL #5   Title  Improve standing balance with tasks of Berg Balance Test with walker support >50/56    Time  3    Period  Months    Status  On-going      PT LONG TERM GOAL #6   Title  Patient ambulates 39' with cane quad tip & prosthesis with  supervision.    Time  3    Period  Months    Status  New            Plan - 01/24/19 1243    Clinical Impression Statement  Continued to review transfers and gait sequences initiated at last session.  Introduced open chain hip extension strengthening maintaining upright trunk to improve retro gait and gait with cane.  Pt continues to demonstrate L hip weakness and compensatory trunk flexion for hip extension.  Continued to review stair negotiation with pt verbalizing and  demonstrating safe sequence with one rail and cane.  Will continue to address in order to progress towards LTG.    Rehab Potential  Good    PT Frequency  2x / week    PT Duration  12 weeks    PT Treatment/Interventions  ADLs/Self Care Home Management;Moist Heat;DME Instruction;Gait training;Stair training;Functional mobility training;Therapeutic activities;Therapeutic exercise;Balance training;Neuromuscular re-education;Patient/family education;Prosthetic Training;Manual techniques;Vestibular    PT Next Visit Plan  10th visit PN and STG.    Consulted and Agree with Plan of Care  Patient       Patient will benefit from skilled therapeutic intervention in order to improve the following deficits and impairments:  Abnormal gait, Decreased activity tolerance, Decreased balance, Decreased endurance, Decreased knowledge of use of DME, Decreased mobility, Decreased strength, Decreased range of motion, Impaired flexibility, Prosthetic Dependency, Postural dysfunction, Increased edema, Dizziness  Visit Diagnosis: 1. Other abnormalities of gait and mobility   2. Muscle weakness (generalized)   3. Abnormal posture   4. Unsteadiness on feet   5. History of falling   6. Pain in left leg   7. Difficulty in walking, not elsewhere classified        Problem List Patient Active Problem List   Diagnosis Date Noted  . On statin therapy due to risk of future cardiovascular event 12/10/2018  . Benign essential HTN   . 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 Above knee amputation status, left   . DM type 2 causing CKD stage 5 ([place]) 03/21/2017  . Anemia of chronic disease 02/05/2017  . Dysplasia of cervix, high grade CIN 2 01/12/2017  . Chronic diastolic CHF (congestive heart failure) ([place]) 11/08/2015  . Total knee replacement status   . Diabetic peripheral neuropathy ([place])   . Major depression, recurrent, chronic ([place])   . Chronic kidney disease (CKD), stage IV (severe) ([place])   . Hepatitis C 08/16/2014  . Peripheral neuropathy 06/24/2013  . Compulsive tobacco user syndrome 06/24/2013    Rico Junker, PT, DPT 01/24/19    12:47 PM    [facility] [address] [place], [place], [address] Phone: [phone]   Fax:  [phone]  Name: Brandy Houston MRN: [id number] Date of Birth: 1956-02-10

## 2019-01-25 DIAGNOSIS — Z992 Dependence on renal dialysis: Secondary | ICD-10-CM | POA: Diagnosis not present

## 2019-01-25 DIAGNOSIS — E1129 Type 2 diabetes mellitus with other diabetic kidney complication: Secondary | ICD-10-CM | POA: Diagnosis not present

## 2019-01-25 DIAGNOSIS — D509 Iron deficiency anemia, unspecified: Secondary | ICD-10-CM | POA: Diagnosis not present

## 2019-01-25 DIAGNOSIS — N2581 Secondary hyperparathyroidism of renal origin: Secondary | ICD-10-CM | POA: Diagnosis not present

## 2019-01-25 DIAGNOSIS — Z23 Encounter for immunization: Secondary | ICD-10-CM | POA: Diagnosis not present

## 2019-01-25 DIAGNOSIS — N186 End stage renal disease: Secondary | ICD-10-CM | POA: Diagnosis not present

## 2019-01-26 ENCOUNTER — Encounter: Payer: Self-pay | Admitting: Physical Therapy

## 2019-01-26 ENCOUNTER — Ambulatory Visit: Payer: Medicare Other | Admitting: Physical Therapy

## 2019-01-26 ENCOUNTER — Other Ambulatory Visit: Payer: Self-pay

## 2019-01-26 DIAGNOSIS — R2681 Unsteadiness on feet: Secondary | ICD-10-CM

## 2019-01-26 DIAGNOSIS — R293 Abnormal posture: Secondary | ICD-10-CM

## 2019-01-26 DIAGNOSIS — R2689 Other abnormalities of gait and mobility: Secondary | ICD-10-CM | POA: Diagnosis not present

## 2019-01-26 DIAGNOSIS — M6281 Muscle weakness (generalized): Secondary | ICD-10-CM | POA: Diagnosis not present

## 2019-01-26 DIAGNOSIS — Z9181 History of falling: Secondary | ICD-10-CM | POA: Diagnosis not present

## 2019-01-26 DIAGNOSIS — M79605 Pain in left leg: Secondary | ICD-10-CM | POA: Diagnosis not present

## 2019-01-26 NOTE — Therapy (Addendum)
[facility] [address] [place], [place], [address] Phone: [phone]   Fax:  [phone]  Physical Therapy Treatment & 10th Visit Progress Note  Patient Details  Name: Brandy Houston MRN: [id number] Date of Birth: 03/27/1956 Referring Provider (PT): Meridee Score, MD   Encounter Date: 01/26/2019   PT End of Session - 01/26/19 0934    Visit Number  29   progress note /recert done at visit 19   Number of Visits  40    Date for PT Re-Evaluation  02/25/19    Authorization Type  Medicare & Medicaid    PT Start Time  0931    PT Stop Time  1009    PT Time Calculation (min)  38 min    Activity Tolerance  Patient tolerated treatment well    Behavior During Therapy  [facility] for tasks assessed/performed       Past Medical History:  Diagnosis Date  . Anemia   . Anxiety   . Arthritis    "in my joints" (03/10/2017)  . Chronic diastolic CHF (congestive heart failure) ([place])   . CKD (chronic kidney disease), stage IV ([place])    stage IV. previous HD, none currently 10/30/15 (confirmed 02/05/2017 & 03/10/2017)  . Depression    Chronic  . Diabetic peripheral neuropathy ([place])   . Dysrhythmia    tachycardia, normal ECHO 08-09-14  . GERD (gastroesophageal reflux disease)   . Heart murmur     dx'd 02/05/2017  . Hepatitis C    "tx'd in 2016; I'm negative now" (02/05/2017)  . History of blood transfusion 2017   "w/knee replacement"  . Hypertension   . Osteoarthritis of left knee   . Peripheral neuropathy   . Protein calorie malnutrition ([place])   . Septic arthritis ([place])   . Slow transit constipation   . Type II diabetes mellitus ([name])    IDDM  . Uncontrolled hypertension 02/18/2013  . Unsteady gait     Past Surgical History:  Procedure Laterality Date  . A/V FISTULAGRAM Right 07/07/2018   Procedure: A/V FISTULAGRAM;  Surgeon: Marty Heck, MD;  Location: [facility];  Service: Cardiovascular;  Laterality: Right;  .  AMPUTATION Left 03/20/2017   Procedure: LEFT ABOVE KNEE AMPUTATION;  Surgeon: Newt Minion, MD;  Location: [facility];  Service: Orthopedics;  Laterality: Left;  . APPENDECTOMY    . AV FISTULA PLACEMENT Left 09/13/2014   Procedure: Brachial Artery to Brachial Vein Gortex Four - Seven Stretch GRAFT INSERTION Left Forearm;  Surgeon: Mal Misty, MD;  Location: [facility];  Service: Vascular;  Laterality: Left;  . BASCILIC VEIN TRANSPOSITION Right 10/26/2017   Procedure: BASILIC VEIN TRANSPOSITION FIRST STAGE RIGHT ARM;  Surgeon: Conrad [place], MD;  Location: [facility];  Service: Vascular;  Laterality: Right;  . BASCILIC VEIN TRANSPOSITION Right 02/01/2018   Procedure: SECOND STAGE BASILIC VEIN TRANSPOSITION RIGHT UPPER EXTREMITY;  Surgeon: Marty Heck, MD;  Location: [facility];  Service: Vascular;  Laterality: Right;  . CHOLECYSTECTOMY OPEN    . COLON SURGERY    . EXCISIONAL TOTAL KNEE ARTHROPLASTY WITH ANTIBIOTIC SPACERS Left 02/06/2017   Procedure: Incisional total iknee with antibiotic spacer ;  Surgeon: Leandrew Koyanagi, MD;  Location: [facility];  Service: Orthopedics;  Laterality: Left;  . IR FLUORO GUIDE CV LINE RIGHT  02/10/2017  . IR REMOVAL TUN CV CATH W/O FL  04/01/2017  . IR US GUIDE VASC ACCESS RIGHT  02/10/2017  . IRRIGATION  AND DEBRIDEMENT KNEE Left 03/12/2017   Procedure: IRRIGATION AND DEBRIDEMENT LEFT KNEE WITH WOUND VAC APPLICATION;  Surgeon: Leandrew Koyanagi, MD;  Location: [facility];  Service: Orthopedics;  Laterality: Left;  . JOINT REPLACEMENT    . KNEE ARTHROSCOPY Right 08/10/2014   Procedure: ARTHROSCOPY I & D KNEE;  Surgeon: Marianna Payment, MD;  Location: [facility];  Service: Orthopedics;  Laterality: Right;  . KNEE ARTHROSCOPY Left 08/11/2014   Procedure: ARTHROSCOPIC WASHOUT LEFT KNEE;  Surgeon: Marianna Payment, MD;  Location: [facility];  Service: Orthopedics;  Laterality: Left;  . KNEE ARTHROSCOPY Left 08/19/2014   Procedure: ARTHROSCOPIC WASHOUT LEFT KNEE;  Surgeon: Leandrew Koyanagi, MD;  Location:  [facility];  Service: Orthopedics;  Laterality: Left;  . KNEE ARTHROSCOPY WITH LATERAL MENISECTOMY Left 04/04/2015   Procedure: AND PARTIAL LATERAL MENISECTOMY;  Surgeon: Leandrew Koyanagi, MD;  Location: [facility];  Service: Orthopedics;  Laterality: Left;  . KNEE ARTHROSCOPY WITH MEDIAL MENISECTOMY Left 04/04/2015   Procedure: LEFT KNEE ARTHROSCOPY WITH PARTIAL MEDIAL MENISCECTOMY  AND SYNOVECTOMY;  Surgeon: Leandrew Koyanagi, MD;  Location: [facility];  Service: Orthopedics;  Laterality: Left;  . PERIPHERAL VASCULAR BALLOON ANGIOPLASTY  07/07/2018   Procedure: PERIPHERAL VASCULAR BALLOON ANGIOPLASTY;  Surgeon: Marty Heck, MD;  Location: [facility];  Service: Cardiovascular;;  right AV fistula  . SHOULDER ARTHROSCOPY Bilateral 08/10/2014   Procedure: I & D BILATERAL SHOULDERS ;  Surgeon: Marianna Payment, MD;  Location: [facility];  Service: Orthopedics;  Laterality: Bilateral;  . SMALL INTESTINE SURGERY     Due to Small Bowel Obstruction; "fixed it when they did my gallbladder OR"  . TEE WITHOUT CARDIOVERSION N/A 08/14/2014   Procedure: TRANSESOPHAGEAL ECHOCARDIOGRAM (TEE);  Surgeon: Thayer Headings, MD;  Location: [facility];  Service: Cardiovascular;  Laterality: N/A;  . TENOSYNOVECTOMY Right 08/11/2014   Procedure: RIGHT WRIST IRRIGATION AND DEBRIDEMENT, TENOSYNOVECTOMY;  Surgeon: Marianna Payment, MD;  Location: [facility];  Service: Orthopedics;  Laterality: Right;  . TOTAL KNEE ARTHROPLASTY Left 11/07/2015   Procedure: LEFT TOTAL KNEE ARTHROPLASTY WITH REVISION OF IMPLANTS;  Surgeon: Leandrew Koyanagi, MD;  Location: [facility];  Service: Orthopedics;  Laterality: Left;  . TUBAL LIGATION      There were no vitals filed for this visit.  Subjective Assessment - 01/26/19 0934    Subjective  No new complaints. No falls. Does not have her cane with her today.    Pertinent History  left TFA, DM, neuropathy, Hep C, HTN, CHF, depression, CKD st 5 not dialysis, arthritis,  heart murmur,     Limitations  Standing;Walking;House hold activities    Patient Stated Goals  To walk with prosthesis in home & community, travel plane, car & cruise in September    Currently in Pain?  No/denies    Pain Score  0-No pain          [facility] Adult PT Treatment/Exercise - 01/26/19 0937      Transfers   Transfers  Sit to Stand;Stand to Sit;Stand Pivot Transfers    Sit to Stand  6: Modified independent (Device/Increase time);With upper extremity assist;From chair/3-in-1    Stand to Sit  6: Modified independent (Device/Increase time);Without upper extremity assist;To chair/3-in-1      Ambulation/Gait   Ambulation/Gait  Yes    Ambulation/Gait Assistance  5: Supervision;4: Min assist    Ambulation/Gait Assistance Details  with rollator- in/outdoor surfaces with no rest break needed, supervision for balance.; use  of straight cane with rubber quad tip in session with min assist, cues on sequencing, cane placement and posture    Ambulation Distance (Feet)  500 Feet   x1 w/rollator; 30 x2 with cane   Assistive device  Prosthesis;Rollator;Straight cane   cane with rubber quad tip   Gait Pattern  Step-through pattern;Decreased step length - right;Decreased stance time - left;Decreased stride length;Decreased hip/knee flexion - left;Trendelenburg;Poor foot clearance - left    Ambulation Surface  Level;Unlevel;Indoor;Outdoor;Paved    Stairs  Yes    Stairs Assistance  5: Supervision    Stairs Assistance Details (indicate cue type and reason)  reminder cues to advance hands along rails, for weight shifting and step placement.    Stair Management Technique  One rail Right;One rail Left;Step to pattern;Forwards;With cane    Number of Stairs  4   x2 reps   Height of Stairs  6      Therapeutic Activites    Therapeutic Activities  Other Therapeutic Activities    Other Therapeutic Activities  with single UE support on rollator- pt able to pick up item of floor with supervision; with no UE  support pt able to stand for 2 minutes with no balance issues with supervision.       Prosthetics   Prosthetic Care Comments   reports wearing prosthesis to/from dialysis. It has been weighed appropriately.     Current prosthetic wear tolerance (days/week)   daily    Current prosthetic wear tolerance (#hours/day)   >/= 8 hours a day non dialysis days, >/= 50% of diaylsis days    Residual limb condition   No skin issues per pt report    Donning Prosthesis  Modified independent (device/increased time)    Doffing Prosthesis  Modified independent (device/increased time)               PT Short Term Goals - 01/26/19 0935      PT SHORT TERM GOAL #1   Title  Patient verbalizes proper prosthesis wear at dialysis including weighing in & out.  (All updated STGs Target Date: 01/28/2019)    Baseline  01/26/19: met- pt is wearing prosthesis to/from dialysis, it has been appropriately weighed.    Status  Achieved    Target Date  --      PT SHORT TERM GOAL #2   Title  patient reports prosthesis wear daily on days leaving home including dialysis for >8 hrs & on days staying home >50% of days for 4hours.  .    Baseline  01/26/19: met today    Time  --    Period  --    Status  Achieved    Target Date  --      PT SHORT TERM GOAL #3   Title  Patient able to safely pick up items from floor with walker support and stands 2 minutes without UE support with supervision.    Baseline  01/26/19: met today    Time  --    Period  --    Status  Achieved    Target Date  --      PT SHORT TERM GOAL #4   Title  Patient ambulates 500' outdoors with 1 seated rest for 5 minutes on paved surfaces with rollator walker & prosthesis with supervision.    Baseline  01/26/19: met today with no rest break needed    Time  --    Period  --    Status  Achieved  Target Date  01/28/19      PT SHORT TERM GOAL #5   Title  Patient negotiates stairs with single rail & cane with prosthesis with supervision.    Baseline   01/26/19: met today    Time  --    Period  --    Status  Achieved    Target Date  01/28/19        PT Long Term Goals - 01/07/19 1858      PT LONG TERM GOAL #1   Title  Patient verbalizes & demonstrates proper prosthetic care with new prosthetic design including prosthesis use at dialysis to enable safe use of prosthesis. (All LTGs Target Date: 02/25/2019)    Time  3    Period  Months    Status  On-going      PT LONG TERM GOAL #2   Title  Patient tolerates & reports prosthesis wear daily >75% of awake hours without skin issues or limb pain.     Time  3    Period  Months    Status  On-going      PT LONG TERM GOAL #3   Title  Patient ambulates 500' with one seated rest <3 minutes outdoors with rollator walker & prosthesis modified independent for community mobility.     Time  3    Period  Months    Status  On-going      PT LONG TERM GOAL #4   Title  Patient negotiates ramps, curbs with rollator walker & stairs 1 rail/cane & prosthesis modified independent to enable community mobility.     Time  3    Period  Months    Status  On-going      PT LONG TERM GOAL #5   Title  Improve standing balance with tasks of Berg Balance Test with walker support >50/56    Time  3    Period  Months    Status  On-going      PT LONG TERM GOAL #6   Title  Patient ambulates 84' with cane quad tip & prosthesis with supervision.    Time  3    Period  Months    Status  New            Plan - 01/26/19 0935    Clinical Impression Statement  Today's skilled session focused on progress toward STGs with all goals met. The pt is progressing well and should benefit from continued PT to progress toward unmet LTGs.    Rehab Potential  Good    PT Frequency  2x / week    PT Duration  12 weeks    PT Treatment/Interventions  ADLs/Self Care Home Management;Moist Heat;DME Instruction;Gait training;Stair training;Functional mobility training;Therapeutic activities;Therapeutic exercise;Balance  training;Neuromuscular re-education;Patient/family education;Prosthetic Training;Manual techniques;Vestibular    PT Next Visit Plan  10th visit PN and STG.    Consulted and Agree with Plan of Care  Patient       Patient will benefit from skilled therapeutic intervention in order to improve the following deficits and impairments:  Abnormal gait, Decreased activity tolerance, Decreased balance, Decreased endurance, Decreased knowledge of use of DME, Decreased mobility, Decreased strength, Decreased range of motion, Impaired flexibility, Prosthetic Dependency, Postural dysfunction, Increased edema, Dizziness  Visit Diagnosis: 1. Other abnormalities of gait and mobility   2. Muscle weakness (generalized)   3. Abnormal posture   4. Unsteadiness on feet        Problem List Patient Active Problem List  Diagnosis Date Noted  . On statin therapy due to risk of future cardiovascular event 12/10/2018  . Benign essential HTN   . Stage 5 chronic kidney disease not on chronic dialysis ([place])   . Above knee amputation status, left   . DM type 2 causing CKD stage 5 ([place]) 03/21/2017  . Anemia of chronic disease 02/05/2017  . Dysplasia of cervix, high grade CIN 2 01/12/2017  . Chronic diastolic CHF (congestive heart failure) ([place]) 11/08/2015  . Total knee replacement status   . Diabetic peripheral neuropathy ([place])   . Major depression, recurrent, chronic ([place])   . Chronic kidney disease (CKD), stage IV (severe) ([place])   . Hepatitis C 08/16/2014  . Peripheral neuropathy 06/24/2013  . Compulsive tobacco user syndrome 06/24/2013    Willow Ora, PTA, [facility] [address], [place], [place] [address] [phone] 01/26/19, 9:32 PM Name: JASPREET BODNER MRN: [id number] Date of Birth: [birth year]   Progress Note Reporting Period 12/13/2018 to 01/26/2019  See note above for Objective Data and Assessment of Progress/Goals.   Jamey Reas, PT, DPT PT Specializing  in [place] 01/27/19 5:12 PM Phone:  [phone]  Fax:  [phone] [facility] [address] [place], [place] [address]

## 2019-01-27 DIAGNOSIS — Z23 Encounter for immunization: Secondary | ICD-10-CM | POA: Diagnosis not present

## 2019-01-27 DIAGNOSIS — E1129 Type 2 diabetes mellitus with other diabetic kidney complication: Secondary | ICD-10-CM | POA: Diagnosis not present

## 2019-01-27 DIAGNOSIS — N2581 Secondary hyperparathyroidism of renal origin: Secondary | ICD-10-CM | POA: Diagnosis not present

## 2019-01-27 DIAGNOSIS — N186 End stage renal disease: Secondary | ICD-10-CM | POA: Diagnosis not present

## 2019-01-27 DIAGNOSIS — D509 Iron deficiency anemia, unspecified: Secondary | ICD-10-CM | POA: Diagnosis not present

## 2019-01-27 DIAGNOSIS — Z992 Dependence on renal dialysis: Secondary | ICD-10-CM | POA: Diagnosis not present

## 2019-01-29 DIAGNOSIS — E1129 Type 2 diabetes mellitus with other diabetic kidney complication: Secondary | ICD-10-CM | POA: Diagnosis not present

## 2019-01-29 DIAGNOSIS — Z992 Dependence on renal dialysis: Secondary | ICD-10-CM | POA: Diagnosis not present

## 2019-01-29 DIAGNOSIS — N186 End stage renal disease: Secondary | ICD-10-CM | POA: Diagnosis not present

## 2019-01-29 DIAGNOSIS — Z23 Encounter for immunization: Secondary | ICD-10-CM | POA: Diagnosis not present

## 2019-01-29 DIAGNOSIS — N2581 Secondary hyperparathyroidism of renal origin: Secondary | ICD-10-CM | POA: Diagnosis not present

## 2019-01-29 DIAGNOSIS — D509 Iron deficiency anemia, unspecified: Secondary | ICD-10-CM | POA: Diagnosis not present

## 2019-01-31 ENCOUNTER — Ambulatory Visit: Payer: Medicare Other | Admitting: Physical Therapy

## 2019-01-31 ENCOUNTER — Encounter: Payer: Self-pay | Admitting: Physical Therapy

## 2019-01-31 ENCOUNTER — Other Ambulatory Visit: Payer: Self-pay

## 2019-01-31 DIAGNOSIS — R2681 Unsteadiness on feet: Secondary | ICD-10-CM

## 2019-01-31 DIAGNOSIS — M6281 Muscle weakness (generalized): Secondary | ICD-10-CM

## 2019-01-31 DIAGNOSIS — Z9181 History of falling: Secondary | ICD-10-CM

## 2019-01-31 DIAGNOSIS — R293 Abnormal posture: Secondary | ICD-10-CM | POA: Diagnosis not present

## 2019-01-31 DIAGNOSIS — R2689 Other abnormalities of gait and mobility: Secondary | ICD-10-CM | POA: Diagnosis not present

## 2019-01-31 DIAGNOSIS — M79605 Pain in left leg: Secondary | ICD-10-CM | POA: Diagnosis not present

## 2019-01-31 NOTE — Therapy (Signed)
[facility] [address] [place], [place], [address] Phone: [phone]   Fax:  [phone]  Physical Therapy Treatment  Patient Details  Name: Brandy Houston MRN: [id number] Date of Birth: 1956/05/03 Referring Provider (PT): Meridee Score, MD   Encounter Date: 01/31/2019   CLINIC OPERATION CHANGES: [facility] is open at lower capacity following universal masking, social distancing, and patient screening.  [name] COVID risk of complications score is 5.   PT End of Session - 01/31/19 1058    Visit Number  30   progress note /recert done at visit 29   Number of Visits  40    Date for PT Re-Evaluation  02/25/19    Authorization Type  Medicare & Medicaid    PT Start Time  9935    PT Stop Time  1055    PT Time Calculation (min)  40 min    Activity Tolerance  Patient tolerated treatment well    Behavior During Therapy  WFL for tasks assessed/performed       Past Medical History:  Diagnosis Date  . Anemia   . Anxiety   . Arthritis    "in my joints" (03/10/2017)  . Chronic diastolic CHF (congestive heart failure) ([place])   . CKD (chronic kidney disease), stage IV ([place])    stage IV. previous HD, none currently 10/30/15 (confirmed 02/05/2017 & 03/10/2017)  . Depression    Chronic  . Diabetic peripheral neuropathy ([place])   . Dysrhythmia    tachycardia, normal ECHO 08-09-14  . GERD (gastroesophageal reflux disease)   . Heart murmur     dx'd 02/05/2017  . Hepatitis C    "tx'd in 2016; I'm negative now" (02/05/2017)  . History of blood transfusion 2017   "w/knee replacement"  . Hypertension   . Osteoarthritis of left knee   . Peripheral neuropathy   . Protein calorie malnutrition ([place])   . Septic arthritis ([place])   . Slow transit constipation   . Type II diabetes mellitus ([name])    IDDM  . Uncontrolled hypertension 02/18/2013  . Unsteady gait     Past Surgical History:  Procedure Laterality Date  . A/V  FISTULAGRAM Right 07/07/2018   Procedure: A/V FISTULAGRAM;  Surgeon: Marty Heck, MD;  Location: [facility];  Service: Cardiovascular;  Laterality: Right;  . AMPUTATION Left 03/20/2017   Procedure: LEFT ABOVE KNEE AMPUTATION;  Surgeon: Newt Minion, MD;  Location: [facility];  Service: Orthopedics;  Laterality: Left;  . APPENDECTOMY    . AV FISTULA PLACEMENT Left 09/13/2014   Procedure: Brachial Artery to Brachial Vein Gortex Four - Seven Stretch GRAFT INSERTION Left Forearm;  Surgeon: Mal Misty, MD;  Location: [facility];  Service: Vascular;  Laterality: Left;  . BASCILIC VEIN TRANSPOSITION Right 10/26/2017   Procedure: BASILIC VEIN TRANSPOSITION FIRST STAGE RIGHT ARM;  Surgeon: Conrad Worcester, MD;  Location: [facility];  Service: Vascular;  Laterality: Right;  . BASCILIC VEIN TRANSPOSITION Right 02/01/2018   Procedure: SECOND STAGE BASILIC VEIN TRANSPOSITION RIGHT UPPER EXTREMITY;  Surgeon: Marty Heck, MD;  Location: [facility];  Service: Vascular;  Laterality: Right;  . CHOLECYSTECTOMY OPEN    . COLON SURGERY    . EXCISIONAL TOTAL KNEE ARTHROPLASTY WITH ANTIBIOTIC SPACERS Left 02/06/2017   Procedure: Incisional total iknee with antibiotic spacer ;  Surgeon: Leandrew Koyanagi, MD;  Location: [facility];  Service: Orthopedics;  Laterality: Left;  . IR FLUORO GUIDE CV LINE RIGHT  02/10/2017  . IR REMOVAL TUN CV CATH W/O FL  04/01/2017  . IR US GUIDE VASC ACCESS RIGHT  02/10/2017  . IRRIGATION AND DEBRIDEMENT KNEE Left 03/12/2017   Procedure: IRRIGATION AND DEBRIDEMENT LEFT KNEE WITH WOUND VAC APPLICATION;  Surgeon: Leandrew Koyanagi, MD;  Location: [facility];  Service: Orthopedics;  Laterality: Left;  . JOINT REPLACEMENT    . KNEE ARTHROSCOPY Right 08/10/2014   Procedure: ARTHROSCOPY I & D KNEE;  Surgeon: Marianna Payment, MD;  Location: [facility];  Service: Orthopedics;  Laterality: Right;  . KNEE ARTHROSCOPY Left 08/11/2014   Procedure: ARTHROSCOPIC WASHOUT LEFT KNEE;  Surgeon: Marianna Payment, MD;   Location: [facility];  Service: Orthopedics;  Laterality: Left;  . KNEE ARTHROSCOPY Left 08/19/2014   Procedure: ARTHROSCOPIC WASHOUT LEFT KNEE;  Surgeon: Leandrew Koyanagi, MD;  Location: [facility];  Service: Orthopedics;  Laterality: Left;  . KNEE ARTHROSCOPY WITH LATERAL MENISECTOMY Left 04/04/2015   Procedure: AND PARTIAL LATERAL MENISECTOMY;  Surgeon: Leandrew Koyanagi, MD;  Location: [facility];  Service: Orthopedics;  Laterality: Left;  . KNEE ARTHROSCOPY WITH MEDIAL MENISECTOMY Left 04/04/2015   Procedure: LEFT KNEE ARTHROSCOPY WITH PARTIAL MEDIAL MENISCECTOMY  AND SYNOVECTOMY;  Surgeon: Leandrew Koyanagi, MD;  Location: [facility];  Service: Orthopedics;  Laterality: Left;  . PERIPHERAL VASCULAR BALLOON ANGIOPLASTY  07/07/2018   Procedure: PERIPHERAL VASCULAR BALLOON ANGIOPLASTY;  Surgeon: Marty Heck, MD;  Location: [facility];  Service: Cardiovascular;;  right AV fistula  . SHOULDER ARTHROSCOPY Bilateral 08/10/2014   Procedure: I & D BILATERAL SHOULDERS ;  Surgeon: Marianna Payment, MD;  Location: [facility];  Service: Orthopedics;  Laterality: Bilateral;  . SMALL INTESTINE SURGERY     Due to Small Bowel Obstruction; "fixed it when they did my gallbladder OR"  . TEE WITHOUT CARDIOVERSION N/A 08/14/2014   Procedure: TRANSESOPHAGEAL ECHOCARDIOGRAM (TEE);  Surgeon: Thayer Headings, MD;  Location: [facility];  Service: Cardiovascular;  Laterality: N/A;  . TENOSYNOVECTOMY Right 08/11/2014   Procedure: RIGHT WRIST IRRIGATION AND DEBRIDEMENT, TENOSYNOVECTOMY;  Surgeon: Marianna Payment, MD;  Location: [facility];  Service: Orthopedics;  Laterality: Right;  . TOTAL KNEE ARTHROPLASTY Left 11/07/2015   Procedure: LEFT TOTAL KNEE ARTHROPLASTY WITH REVISION OF IMPLANTS;  Surgeon: Leandrew Koyanagi, MD;  Location: [facility];  Service: Orthopedics;  Laterality: Left;  . TUBAL LIGATION      There were no vitals filed for this visit.  Subjective Assessment - 01/31/19 1015    Subjective  She  got prosthesis covered ~2 weeks ago and knee seems stiffer.    Pertinent History  left TFA, DM, neuropathy, Hep C, HTN, CHF, depression, CKD st 5 not dialysis, arthritis, heart murmur,     Limitations  Standing;Walking;House hold activities    Patient Stated Goals  To walk with prosthesis in home & community, travel plane, car & cruise in September    Currently in Pain?  No/denies                       [facility] Adult PT Treatment/Exercise - 01/31/19 1015      Transfers   Transfers  Sit to Stand;Stand to [employer] Transfers    Sit to Stand  5: Supervision;With upper extremity assist;From chair/3-in-1   chairs without armrests to cane   Stand to Sit  5: Supervision;With upper extremity assist;To chair/3-in-1   chairs without armrests from cane     Ambulation/Gait  Ambulation/Gait  Yes    Ambulation/Gait Assistance  5: Supervision;4: Min assist    Ambulation/Gait Assistance Details  worked on household gait negotiating around furniture carrying cup of water 1x and plate of objects 1x.     Ambulation Distance (Feet)  75 Feet   75' X 4   Assistive device  Prosthesis;Rollator;Straight cane   cane with rubber quad tip   Gait Pattern  Step-through pattern;Decreased step length - right;Decreased stance time - left;Decreased stride length;Decreased hip/knee flexion - left;Trendelenburg;Poor foot clearance - left    Ambulation Surface  Level;Indoor      Prosthetics   Prosthetic Care Comments   reports wearing prosthesis to/from dialysis. It has been weighed appropriately.     Current prosthetic wear tolerance (days/week)   daily    Current prosthetic wear tolerance (#hours/day)   >/= 8 hours a day non dialysis days, >/= 50% of diaylsis days    Residual limb condition   No skin issues per pt report             PT Education - 01/31/19 1050    Education provided  Yes    Education Details  increasing activity tolerance by more consistent daily activities and walking  program on non-dialysis days ambulating from home with rollator, seated rest & returning to home. When distance becomes less stressful, then increase distance    Person(s) Educated  Patient    Methods  Explanation    Comprehension  Verbalized understanding       PT Short Term Goals - 01/26/19 0935      PT SHORT TERM GOAL #1   Title  Patient verbalizes proper prosthesis wear at dialysis including weighing in & out.  (All updated STGs Target Date: 01/28/2019)    Baseline  01/26/19: met- pt is wearing prosthesis to/from dialysis, it has been appropriately weighed.    Status  Achieved    Target Date  --      PT SHORT TERM GOAL #2   Title  patient reports prosthesis wear daily on days leaving home including dialysis for >8 hrs & on days staying home >50% of days for 4hours.  .    Baseline  01/26/19: met today    Time  --    Period  --    Status  Achieved    Target Date  --      PT SHORT TERM GOAL #3   Title  Patient able to safely pick up items from floor with walker support and stands 2 minutes without UE support with supervision.    Baseline  01/26/19: met today    Time  --    Period  --    Status  Achieved    Target Date  --      PT SHORT TERM GOAL #4   Title  Patient ambulates 500' outdoors with 1 seated rest for 5 minutes on paved surfaces with rollator walker & prosthesis with supervision.    Baseline  01/26/19: met today with no rest break needed    Time  --    Period  --    Status  Achieved    Target Date  01/28/19      PT SHORT TERM GOAL #5   Title  Patient negotiates stairs with single rail & cane with prosthesis with supervision.    Baseline  01/26/19: met today    Time  --    Period  --    Status  Achieved  Target Date  01/28/19        PT Long Term Goals - 01/07/19 1858      PT LONG TERM GOAL #1   Title  Patient verbalizes & demonstrates proper prosthetic care with new prosthetic design including prosthesis use at dialysis to enable safe use of prosthesis. (All  LTGs Target Date: 02/25/2019)    Time  3    Period  Months    Status  On-going      PT LONG TERM GOAL #2   Title  Patient tolerates & reports prosthesis wear daily >75% of awake hours without skin issues or limb pain.     Time  3    Period  Months    Status  On-going      PT LONG TERM GOAL #3   Title  Patient ambulates 500' with one seated rest <3 minutes outdoors with rollator walker & prosthesis modified independent for community mobility.     Time  3    Period  Months    Status  On-going      PT LONG TERM GOAL #4   Title  Patient negotiates ramps, curbs with rollator walker & stairs 1 rail/cane & prosthesis modified independent to enable community mobility.     Time  3    Period  Months    Status  On-going      PT LONG TERM GOAL #5   Title  Improve standing balance with tasks of [place] Balance Test with walker support >50/56    Time  3    Period  Months    Status  On-going      PT LONG TERM GOAL #6   Title  Patient ambulates 44' with cane quad tip & prosthesis with supervision.    Time  3    Period  Months    Status  New            Plan - 01/31/19 2107    Clinical Impression Statement  Today's session focused on prosthetic household gait with straight cane quad tip carrying cup or plate of objects. Patient is on target to meet LTGs in POC.    Rehab Potential  Good    PT Frequency  2x / week    PT Duration  12 weeks    PT Treatment/Interventions  ADLs/Self Care Home Management;Moist Heat;DME Instruction;Gait training;Stair training;Functional mobility training;Therapeutic activities;Therapeutic exercise;Balance training;Neuromuscular re-education;Patient/family education;Prosthetic Training;Manual techniques;Vestibular    PT Next Visit Plan  work towards [place].    Consulted and Agree with Plan of Care  Patient       Patient will benefit from skilled therapeutic intervention in order to improve the following deficits and impairments:  Abnormal gait, Decreased  activity tolerance, Decreased balance, Decreased endurance, Decreased knowledge of use of DME, Decreased mobility, Decreased strength, Decreased range of motion, Impaired flexibility, Prosthetic Dependency, Postural dysfunction, Increased edema, Dizziness  Visit Diagnosis: 1. Muscle weakness (generalized)   2. Other abnormalities of gait and mobility   3. Abnormal posture   4. Unsteadiness on feet   5. History of falling        Problem List Patient Active Problem List   Diagnosis Date Noted  . On statin therapy due to risk of future cardiovascular event 12/10/2018  . Benign essential HTN   . Stage 5 chronic kidney disease not on chronic dialysis ([place])   . Above knee amputation status, left   . DM type 2 causing CKD stage 5 ([place]) 03/21/2017  .  Anemia of chronic disease 02/05/2017  . Dysplasia of cervix, high grade CIN 2 01/12/2017  . Chronic diastolic CHF (congestive heart failure) ([place]) 11/08/2015  . Total knee replacement status   . Diabetic peripheral neuropathy ([place])   . Major depression, recurrent, chronic ([place])   . Chronic kidney disease (CKD), stage IV (severe) ([place])   . Hepatitis C 08/16/2014  . Peripheral neuropathy 06/24/2013  . Compulsive tobacco user syndrome 06/24/2013    Jamey Reas PT, DPT 01/31/2019, 9:09 PM  [name] [address] [place], [place], [address] Phone: [phone]   Fax:  [phone]  Name: Brandy Houston MRN: [id number] Date of Birth: 29-Aug-1955

## 2019-02-01 DIAGNOSIS — N2581 Secondary hyperparathyroidism of renal origin: Secondary | ICD-10-CM | POA: Diagnosis not present

## 2019-02-01 DIAGNOSIS — N186 End stage renal disease: Secondary | ICD-10-CM | POA: Diagnosis not present

## 2019-02-01 DIAGNOSIS — Z23 Encounter for immunization: Secondary | ICD-10-CM | POA: Diagnosis not present

## 2019-02-01 DIAGNOSIS — Z992 Dependence on renal dialysis: Secondary | ICD-10-CM | POA: Diagnosis not present

## 2019-02-01 DIAGNOSIS — D509 Iron deficiency anemia, unspecified: Secondary | ICD-10-CM | POA: Diagnosis not present

## 2019-02-01 DIAGNOSIS — E1129 Type 2 diabetes mellitus with other diabetic kidney complication: Secondary | ICD-10-CM | POA: Diagnosis not present

## 2019-02-02 ENCOUNTER — Other Ambulatory Visit: Payer: Self-pay

## 2019-02-02 ENCOUNTER — Encounter: Payer: Self-pay | Admitting: Physical Therapy

## 2019-02-02 ENCOUNTER — Ambulatory Visit: Payer: Medicare Other | Admitting: Physical Therapy

## 2019-02-02 DIAGNOSIS — R2681 Unsteadiness on feet: Secondary | ICD-10-CM

## 2019-02-02 DIAGNOSIS — R2689 Other abnormalities of gait and mobility: Secondary | ICD-10-CM

## 2019-02-02 DIAGNOSIS — R293 Abnormal posture: Secondary | ICD-10-CM

## 2019-02-02 DIAGNOSIS — M6281 Muscle weakness (generalized): Secondary | ICD-10-CM | POA: Diagnosis not present

## 2019-02-02 DIAGNOSIS — M79605 Pain in left leg: Secondary | ICD-10-CM

## 2019-02-02 DIAGNOSIS — Z9181 History of falling: Secondary | ICD-10-CM | POA: Diagnosis not present

## 2019-02-02 NOTE — Therapy (Signed)
[facility] [address] [place], [place], [address] Phone: [phone]   Fax:  [phone]  Physical Therapy Treatment  Patient Details  Name: Brandy Houston MRN: [id number] Date of Birth: 09-04-1955 Referring Provider (PT): Meridee Score, MD   Encounter Date: 02/02/2019   CLINIC OPERATION CHANGES: [facility] is open at lower capacity following universal masking, social distancing, and patient screening.  [name] COVID risk of complications score is 5.   PT End of Session - 02/02/19 1213    Visit Number  31   progress note /recert done at visit 29   Number of Visits  40    Date for PT Re-Evaluation  02/25/19    Authorization Type  Medicare & Medicaid    PT Start Time  1142    PT Stop Time  1215   session ended early due to pt toileting needs   PT Time Calculation (min)  33 min    Activity Tolerance  Patient tolerated treatment well    Behavior During Therapy  WFL for tasks assessed/performed       Past Medical History:  Diagnosis Date  . Anemia   . Anxiety   . Arthritis    "in my joints" (03/10/2017)  . Chronic diastolic CHF (congestive heart failure) ([place])   . CKD (chronic kidney disease), stage IV ([place])    stage IV. previous HD, none currently 10/30/15 (confirmed 02/05/2017 & 03/10/2017)  . Depression    Chronic  . Diabetic peripheral neuropathy ([place])   . Dysrhythmia    tachycardia, normal ECHO 08-09-14  . GERD (gastroesophageal reflux disease)   . Heart murmur     dx'd 02/05/2017  . Hepatitis C    "tx'd in 2016; I'm negative now" (02/05/2017)  . History of blood transfusion 2017   "w/knee replacement"  . Hypertension   . Osteoarthritis of left knee   . Peripheral neuropathy   . Protein calorie malnutrition ([place])   . Septic arthritis ([place])   . Slow transit constipation   . Type II diabetes mellitus ([name])    IDDM  . Uncontrolled hypertension 02/18/2013  . Unsteady gait     Past  Surgical History:  Procedure Laterality Date  . A/V FISTULAGRAM Right 07/07/2018   Procedure: A/V FISTULAGRAM;  Surgeon: Marty Heck, MD;  Location: [facility];  Service: Cardiovascular;  Laterality: Right;  . AMPUTATION Left 03/20/2017   Procedure: LEFT ABOVE KNEE AMPUTATION;  Surgeon: Newt Minion, MD;  Location: [facility];  Service: Orthopedics;  Laterality: Left;  . APPENDECTOMY    . AV FISTULA PLACEMENT Left 09/13/2014   Procedure: Brachial Artery to Brachial Vein Gortex Four - Seven Stretch GRAFT INSERTION Left Forearm;  Surgeon: Mal Misty, MD;  Location: [facility];  Service: Vascular;  Laterality: Left;  . BASCILIC VEIN TRANSPOSITION Right 10/26/2017   Procedure: BASILIC VEIN TRANSPOSITION FIRST STAGE RIGHT ARM;  Surgeon: Conrad [place], MD;  Location: [facility];  Service: Vascular;  Laterality: Right;  . BASCILIC VEIN TRANSPOSITION Right 02/01/2018   Procedure: SECOND STAGE BASILIC VEIN TRANSPOSITION RIGHT UPPER EXTREMITY;  Surgeon: Marty Heck, MD;  Location: [facility];  Service: Vascular;  Laterality: Right;  . CHOLECYSTECTOMY OPEN    . COLON SURGERY    . EXCISIONAL TOTAL KNEE ARTHROPLASTY WITH ANTIBIOTIC SPACERS Left 02/06/2017   Procedure: Incisional total iknee with antibiotic spacer ;  Surgeon: Leandrew Koyanagi, MD;  Location: [facility];  Service: Orthopedics;  Laterality:  Left;  . IR FLUORO GUIDE CV LINE RIGHT  02/10/2017  . IR REMOVAL TUN CV CATH W/O FL  04/01/2017  . IR US GUIDE VASC ACCESS RIGHT  02/10/2017  . IRRIGATION AND DEBRIDEMENT KNEE Left 03/12/2017   Procedure: IRRIGATION AND DEBRIDEMENT LEFT KNEE WITH WOUND VAC APPLICATION;  Surgeon: Leandrew Koyanagi, MD;  Location: [facility];  Service: Orthopedics;  Laterality: Left;  . JOINT REPLACEMENT    . KNEE ARTHROSCOPY Right 08/10/2014   Procedure: ARTHROSCOPY I & D KNEE;  Surgeon: Marianna Payment, MD;  Location: [facility];  Service: Orthopedics;  Laterality: Right;  . KNEE ARTHROSCOPY Left 08/11/2014   Procedure: ARTHROSCOPIC  WASHOUT LEFT KNEE;  Surgeon: Marianna Payment, MD;  Location: [facility];  Service: Orthopedics;  Laterality: Left;  . KNEE ARTHROSCOPY Left 08/19/2014   Procedure: ARTHROSCOPIC WASHOUT LEFT KNEE;  Surgeon: Leandrew Koyanagi, MD;  Location: [facility];  Service: Orthopedics;  Laterality: Left;  . KNEE ARTHROSCOPY WITH LATERAL MENISECTOMY Left 04/04/2015   Procedure: AND PARTIAL LATERAL MENISECTOMY;  Surgeon: Leandrew Koyanagi, MD;  Location: [facility];  Service: Orthopedics;  Laterality: Left;  . KNEE ARTHROSCOPY WITH MEDIAL MENISECTOMY Left 04/04/2015   Procedure: LEFT KNEE ARTHROSCOPY WITH PARTIAL MEDIAL MENISCECTOMY  AND SYNOVECTOMY;  Surgeon: Leandrew Koyanagi, MD;  Location: [facility];  Service: Orthopedics;  Laterality: Left;  . PERIPHERAL VASCULAR BALLOON ANGIOPLASTY  07/07/2018   Procedure: PERIPHERAL VASCULAR BALLOON ANGIOPLASTY;  Surgeon: Marty Heck, MD;  Location: [facility];  Service: Cardiovascular;;  right AV fistula  . SHOULDER ARTHROSCOPY Bilateral 08/10/2014   Procedure: I & D BILATERAL SHOULDERS ;  Surgeon: Marianna Payment, MD;  Location: [facility];  Service: Orthopedics;  Laterality: Bilateral;  . SMALL INTESTINE SURGERY     Due to Small Bowel Obstruction; "fixed it when they did my gallbladder OR"  . TEE WITHOUT CARDIOVERSION N/A 08/14/2014   Procedure: TRANSESOPHAGEAL ECHOCARDIOGRAM (TEE);  Surgeon: Thayer Headings, MD;  Location: [facility];  Service: Cardiovascular;  Laterality: N/A;  . TENOSYNOVECTOMY Right 08/11/2014   Procedure: RIGHT WRIST IRRIGATION AND DEBRIDEMENT, TENOSYNOVECTOMY;  Surgeon: Marianna Payment, MD;  Location: [facility];  Service: Orthopedics;  Laterality: Right;  . TOTAL KNEE ARTHROPLASTY Left 11/07/2015   Procedure: LEFT TOTAL KNEE ARTHROPLASTY WITH REVISION OF IMPLANTS;  Surgeon: Leandrew Koyanagi, MD;  Location: [facility];  Service: Orthopedics;  Laterality: Left;  . TUBAL LIGATION      There were no vitals filed for this  visit.  Subjective Assessment - 02/02/19 1145    Subjective  She had rough day at dialysis yesterday.    Pertinent History  left TFA, DM, neuropathy, Hep C, HTN, CHF, depression, CKD st 5 not dialysis, arthritis, heart murmur,     Limitations  Standing;Walking;House hold activities    Patient Stated Goals  To walk with prosthesis in home & community, travel plane, car & cruise in September    Currently in Pain?  No/denies                       [facility] Adult PT Treatment/Exercise - 02/02/19 1145      Transfers   Transfers  Sit to Stand;Stand to [employer] Transfers    Sit to Stand  5: Supervision;With upper extremity assist;From chair/3-in-1   chairs without armrests to cane   Stand to Sit  5: Supervision;With upper extremity assist;To chair/3-in-1   chairs without armrests from cane  Ambulation/Gait   Ambulation/Gait  Yes    Ambulation/Gait Assistance  5: Supervision;4: Min assist    Ambulation/Gait Assistance Details  carrying plate & cup around furniture    Ambulation Distance (Feet)  75 Feet   75' X 4   Assistive device  Prosthesis;Rollator;Straight cane   cane with rubber quad tip   Gait Pattern  Step-through pattern;Decreased step length - right;Decreased stance time - left;Decreased stride length;Decreased hip/knee flexion - left;Trendelenburg;Poor foot clearance - left    Ambulation Surface  Indoor;Level    Stairs  Yes    Stairs Assistance  5: Supervision    Stair Management Technique  One rail Right;One rail Left;With cane;Step to pattern;Forwards    Number of Stairs  4   2 reps   Ramp  4: Min assist   cane & HHA with TFA prosthesis   Ramp Details (indicate cue type and reason)  cues on sequence    Curb  4: Min assist   cane & HHA with TFA prosthesis   Curb Details (indicate cue type and reason)  cues on technique with TFA prosthesis      Prosthetics   Prosthetic Care Comments   reports wearing prosthesis to/from dialysis. It has been weighed  appropriately.     Current prosthetic wear tolerance (days/week)   daily    Current prosthetic wear tolerance (#hours/day)   >/= 8 hours a day non dialysis days, >/= 50% of diaylsis days    Residual limb condition   No skin issues per pt report               PT Short Term Goals - 01/26/19 0935      PT SHORT TERM GOAL #1   Title  Patient verbalizes proper prosthesis wear at dialysis including weighing in & out.  (All updated STGs Target Date: 01/28/2019)    Baseline  01/26/19: met- pt is wearing prosthesis to/from dialysis, it has been appropriately weighed.    Status  Achieved    Target Date  --      PT SHORT TERM GOAL #2   Title  patient reports prosthesis wear daily on days leaving home including dialysis for >8 hrs & on days staying home >50% of days for 4hours.  .    Baseline  01/26/19: met today    Time  --    Period  --    Status  Achieved    Target Date  --      PT SHORT TERM GOAL #3   Title  Patient able to safely pick up items from floor with walker support and stands 2 minutes without UE support with supervision.    Baseline  01/26/19: met today    Time  --    Period  --    Status  Achieved    Target Date  --      PT SHORT TERM GOAL #4   Title  Patient ambulates 500' outdoors with 1 seated rest for 5 minutes on paved surfaces with rollator walker & prosthesis with supervision.    Baseline  01/26/19: met today with no rest break needed    Time  --    Period  --    Status  Achieved    Target Date  01/28/19      PT SHORT TERM GOAL #5   Title  Patient negotiates stairs with single rail & cane with prosthesis with supervision.    Baseline  01/26/19: met today    Time  --  Period  --    Status  Achieved    Target Date  01/28/19        PT Long Term Goals - 01/07/19 1858      PT LONG TERM GOAL #1   Title  Patient verbalizes & demonstrates proper prosthetic care with new prosthetic design including prosthesis use at dialysis to enable safe use of prosthesis.  (All LTGs Target Date: 02/25/2019)    Time  3    Period  Months    Status  On-going      PT LONG TERM GOAL #2   Title  Patient tolerates & reports prosthesis wear daily >75% of awake hours without skin issues or limb pain.     Time  3    Period  Months    Status  On-going      PT LONG TERM GOAL #3   Title  Patient ambulates 500' with one seated rest <3 minutes outdoors with rollator walker & prosthesis modified independent for community mobility.     Time  3    Period  Months    Status  On-going      PT LONG TERM GOAL #4   Title  Patient negotiates ramps, curbs with rollator walker & stairs 1 rail/cane & prosthesis modified independent to enable community mobility.     Time  3    Period  Months    Status  On-going      PT LONG TERM GOAL #5   Title  Improve standing balance with tasks of Berg Balance Test with walker support >50/56    Time  3    Period  Months    Status  On-going      PT LONG TERM GOAL #6   Title  Patient ambulates 76' with cane quad tip & prosthesis with supervision.    Time  3    Period  Months    Status  New            Plan - 02/02/19 2305    Clinical Impression Statement  Patient is on target to meet LTGs. Today's session focused on prosthetic gait with cane.    Rehab Potential  Good    PT Frequency  2x / week    PT Duration  12 weeks    PT Treatment/Interventions  ADLs/Self Care Home Management;Moist Heat;DME Instruction;Gait training;Stair training;Functional mobility training;Therapeutic activities;Therapeutic exercise;Balance training;Neuromuscular re-education;Patient/family education;Prosthetic Training;Manual techniques;Vestibular    PT Next Visit Plan  work towards [place].    Consulted and Agree with Plan of Care  Patient       Patient will benefit from skilled therapeutic intervention in order to improve the following deficits and impairments:  Abnormal gait, Decreased activity tolerance, Decreased balance, Decreased endurance, Decreased  knowledge of use of DME, Decreased mobility, Decreased strength, Decreased range of motion, Impaired flexibility, Prosthetic Dependency, Postural dysfunction, Increased edema, Dizziness  Visit Diagnosis: 1. Muscle weakness (generalized)   2. Other abnormalities of gait and mobility   3. Abnormal posture   4. Unsteadiness on feet   5. Pain in left leg   6. History of falling        Problem List Patient Active Problem List   Diagnosis Date Noted  . On statin therapy due to risk of future cardiovascular event 12/10/2018  . Benign essential HTN   . Stage 5 chronic kidney disease not on chronic dialysis ([place])   . Above knee amputation status, left   . DM type 2 causing  CKD stage 5 ([place]) 03/21/2017  . Anemia of chronic disease 02/05/2017  . Dysplasia of cervix, high grade CIN 2 01/12/2017  . Chronic diastolic CHF (congestive heart failure) ([place]) 11/08/2015  . Total knee replacement status   . Diabetic peripheral neuropathy ([place])   . Major depression, recurrent, chronic ([place])   . Chronic kidney disease (CKD), stage IV (severe) ([place])   . Hepatitis C 08/16/2014  . Peripheral neuropathy 06/24/2013  . Compulsive tobacco user syndrome 06/24/2013    Jamey Reas PT, DPT 02/02/2019, 11:06 PM  [facility] [address] [place], [place], [address] Phone: [phone]   Fax:  [phone]  Name: Brandy Houston MRN: [id number] Date of Birth: 1956-04-18

## 2019-02-03 DIAGNOSIS — D509 Iron deficiency anemia, unspecified: Secondary | ICD-10-CM | POA: Diagnosis not present

## 2019-02-03 DIAGNOSIS — N2581 Secondary hyperparathyroidism of renal origin: Secondary | ICD-10-CM | POA: Diagnosis not present

## 2019-02-03 DIAGNOSIS — Z23 Encounter for immunization: Secondary | ICD-10-CM | POA: Diagnosis not present

## 2019-02-03 DIAGNOSIS — N186 End stage renal disease: Secondary | ICD-10-CM | POA: Diagnosis not present

## 2019-02-03 DIAGNOSIS — E1129 Type 2 diabetes mellitus with other diabetic kidney complication: Secondary | ICD-10-CM | POA: Diagnosis not present

## 2019-02-03 DIAGNOSIS — Z992 Dependence on renal dialysis: Secondary | ICD-10-CM | POA: Diagnosis not present

## 2019-02-05 DIAGNOSIS — N2581 Secondary hyperparathyroidism of renal origin: Secondary | ICD-10-CM | POA: Diagnosis not present

## 2019-02-05 DIAGNOSIS — N186 End stage renal disease: Secondary | ICD-10-CM | POA: Diagnosis not present

## 2019-02-05 DIAGNOSIS — Z992 Dependence on renal dialysis: Secondary | ICD-10-CM | POA: Diagnosis not present

## 2019-02-05 DIAGNOSIS — E1129 Type 2 diabetes mellitus with other diabetic kidney complication: Secondary | ICD-10-CM | POA: Diagnosis not present

## 2019-02-05 DIAGNOSIS — Z23 Encounter for immunization: Secondary | ICD-10-CM | POA: Diagnosis not present

## 2019-02-05 DIAGNOSIS — D509 Iron deficiency anemia, unspecified: Secondary | ICD-10-CM | POA: Diagnosis not present

## 2019-02-07 ENCOUNTER — Other Ambulatory Visit: Payer: Self-pay

## 2019-02-07 ENCOUNTER — Encounter: Payer: Self-pay | Admitting: Physical Therapy

## 2019-02-07 ENCOUNTER — Ambulatory Visit: Payer: Medicare Other | Admitting: Physical Therapy

## 2019-02-07 DIAGNOSIS — Z9181 History of falling: Secondary | ICD-10-CM | POA: Diagnosis not present

## 2019-02-07 DIAGNOSIS — R2689 Other abnormalities of gait and mobility: Secondary | ICD-10-CM | POA: Diagnosis not present

## 2019-02-07 DIAGNOSIS — M6281 Muscle weakness (generalized): Secondary | ICD-10-CM | POA: Diagnosis not present

## 2019-02-07 DIAGNOSIS — R293 Abnormal posture: Secondary | ICD-10-CM | POA: Diagnosis not present

## 2019-02-07 DIAGNOSIS — M79605 Pain in left leg: Secondary | ICD-10-CM | POA: Diagnosis not present

## 2019-02-07 DIAGNOSIS — R2681 Unsteadiness on feet: Secondary | ICD-10-CM

## 2019-02-07 NOTE — Therapy (Signed)
[facility] [address] [place], [place], [address] Phone: [phone]   Fax:  [phone]  Physical Therapy Treatment  Patient Details  Name: Brandy Houston MRN: [id number] Date of Birth: 11/07/1955 Referring Provider (PT): Meridee Score, MD   Encounter Date: 02/07/2019   CLINIC OPERATION CHANGES: [facility] is open at lower capacity following universal masking, social distancing, and patient screening.  [name] COVID risk of complications score is 5.   PT End of Session - 02/07/19 1111    Visit Number  32   progress note /recert done at visit 29   Number of Visits  40    Date for PT Re-Evaluation  02/25/19    Authorization Type  Medicare & Medicaid    PT Start Time  4174    PT Stop Time  1100    PT Time Calculation (min)  45 min    Activity Tolerance  Patient tolerated treatment well    Behavior During Therapy  WFL for tasks assessed/performed       Past Medical History:  Diagnosis Date  . Anemia   . Anxiety   . Arthritis    "in my joints" (03/10/2017)  . Chronic diastolic CHF (congestive heart failure) ([facility])   . CKD (chronic kidney disease), stage IV ([facility])    stage IV. previous HD, none currently 10/30/15 (confirmed 02/05/2017 & 03/10/2017)  . Depression    Chronic  . Diabetic peripheral neuropathy ([facility])   . Dysrhythmia    tachycardia, normal ECHO 08-09-14  . GERD (gastroesophageal reflux disease)   . Heart murmur     dx'd 02/05/2017  . Hepatitis C    "tx'd in 2016; I'm negative now" (02/05/2017)  . History of blood transfusion 2017   "w/knee replacement"  . Hypertension   . Osteoarthritis of left knee   . Peripheral neuropathy   . Protein calorie malnutrition ([place])   . Septic arthritis ([place])   . Slow transit constipation   . Type II diabetes mellitus ([name])    IDDM  . Uncontrolled hypertension 02/18/2013  . Unsteady gait     Past Surgical History:  Procedure Laterality Date  . A/V  FISTULAGRAM Right 07/07/2018   Procedure: A/V FISTULAGRAM;  Surgeon: Marty Heck, MD;  Location: [facility];  Service: Cardiovascular;  Laterality: Right;  . AMPUTATION Left 03/20/2017   Procedure: LEFT ABOVE KNEE AMPUTATION;  Surgeon: Newt Minion, MD;  Location: [facility];  Service: Orthopedics;  Laterality: Left;  . APPENDECTOMY    . AV FISTULA PLACEMENT Left 09/13/2014   Procedure: Brachial Artery to Brachial Vein Gortex Four - Seven Stretch GRAFT INSERTION Left Forearm;  Surgeon: Mal Misty, MD;  Location: [facility];  Service: Vascular;  Laterality: Left;  . BASCILIC VEIN TRANSPOSITION Right 10/26/2017   Procedure: BASILIC VEIN TRANSPOSITION FIRST STAGE RIGHT ARM;  Surgeon: Conrad Golf Manor, MD;  Location: [facility];  Service: Vascular;  Laterality: Right;  . BASCILIC VEIN TRANSPOSITION Right 02/01/2018   Procedure: SECOND STAGE BASILIC VEIN TRANSPOSITION RIGHT UPPER EXTREMITY;  Surgeon: Marty Heck, MD;  Location: [facility];  Service: Vascular;  Laterality: Right;  . CHOLECYSTECTOMY OPEN    . COLON SURGERY    . EXCISIONAL TOTAL KNEE ARTHROPLASTY WITH ANTIBIOTIC SPACERS Left 02/06/2017   Procedure: Incisional total iknee with antibiotic spacer ;  Surgeon: Leandrew Koyanagi, MD;  Location: [facility];  Service: Orthopedics;  Laterality: Left;  . IR FLUORO GUIDE CV LINE RIGHT  02/10/2017  . IR REMOVAL TUN CV CATH W/O FL  04/01/2017  . IR US GUIDE VASC ACCESS RIGHT  02/10/2017  . IRRIGATION AND DEBRIDEMENT KNEE Left 03/12/2017   Procedure: IRRIGATION AND DEBRIDEMENT LEFT KNEE WITH WOUND VAC APPLICATION;  Surgeon: Leandrew Koyanagi, MD;  Location: [facility];  Service: Orthopedics;  Laterality: Left;  . JOINT REPLACEMENT    . KNEE ARTHROSCOPY Right 08/10/2014   Procedure: ARTHROSCOPY I & D KNEE;  Surgeon: Marianna Payment, MD;  Location: [facility];  Service: Orthopedics;  Laterality: Right;  . KNEE ARTHROSCOPY Left 08/11/2014   Procedure: ARTHROSCOPIC WASHOUT LEFT KNEE;  Surgeon: Marianna Payment, MD;   Location: [facility];  Service: Orthopedics;  Laterality: Left;  . KNEE ARTHROSCOPY Left 08/19/2014   Procedure: ARTHROSCOPIC WASHOUT LEFT KNEE;  Surgeon: Leandrew Koyanagi, MD;  Location: [facility];  Service: Orthopedics;  Laterality: Left;  . KNEE ARTHROSCOPY WITH LATERAL MENISECTOMY Left 04/04/2015   Procedure: AND PARTIAL LATERAL MENISECTOMY;  Surgeon: Leandrew Koyanagi, MD;  Location: [facility];  Service: Orthopedics;  Laterality: Left;  . KNEE ARTHROSCOPY WITH MEDIAL MENISECTOMY Left 04/04/2015   Procedure: LEFT KNEE ARTHROSCOPY WITH PARTIAL MEDIAL MENISCECTOMY  AND SYNOVECTOMY;  Surgeon: Leandrew Koyanagi, MD;  Location: [facility];  Service: Orthopedics;  Laterality: Left;  . PERIPHERAL VASCULAR BALLOON ANGIOPLASTY  07/07/2018   Procedure: PERIPHERAL VASCULAR BALLOON ANGIOPLASTY;  Surgeon: Marty Heck, MD;  Location: [facility];  Service: Cardiovascular;;  right AV fistula  . SHOULDER ARTHROSCOPY Bilateral 08/10/2014   Procedure: I & D BILATERAL SHOULDERS ;  Surgeon: Marianna Payment, MD;  Location: [facility];  Service: Orthopedics;  Laterality: Bilateral;  . SMALL INTESTINE SURGERY     Due to Small Bowel Obstruction; "fixed it when they did my gallbladder OR"  . TEE WITHOUT CARDIOVERSION N/A 08/14/2014   Procedure: TRANSESOPHAGEAL ECHOCARDIOGRAM (TEE);  Surgeon: Thayer Headings, MD;  Location: [facility];  Service: Cardiovascular;  Laterality: N/A;  . TENOSYNOVECTOMY Right 08/11/2014   Procedure: RIGHT WRIST IRRIGATION AND DEBRIDEMENT, TENOSYNOVECTOMY;  Surgeon: Marianna Payment, MD;  Location: [facility];  Service: Orthopedics;  Laterality: Right;  . TOTAL KNEE ARTHROPLASTY Left 11/07/2015   Procedure: LEFT TOTAL KNEE ARTHROPLASTY WITH REVISION OF IMPLANTS;  Surgeon: Leandrew Koyanagi, MD;  Location: [facility];  Service: Orthopedics;  Laterality: Left;  . TUBAL LIGATION      There were no vitals filed for this visit.  Subjective Assessment - 02/07/19 1015    Subjective  She  is wearing prosthesis daily even if staying home.  She feels that she is improving.    Pertinent History  left TFA, DM, neuropathy, Hep C, HTN, CHF, depression, CKD st 5 not dialysis, arthritis, heart murmur,     Limitations  Standing;Walking;House hold activities    Patient Stated Goals  To walk with prosthesis in home & community, travel plane, car & cruise in September    Currently in Pain?  No/denies                       [facility] Adult PT Treatment/Exercise - 02/07/19 1015      Transfers   Transfers  Sit to Stand;Stand to [employer] Transfers    Sit to Stand  5: Supervision;With upper extremity assist;From chair/3-in-1   chairs without armrests to cane   Stand to Sit  5: Supervision;With upper extremity assist;To chair/3-in-1   chairs without armrests from cane  Ambulation/Gait   Ambulation/Gait  Yes    Ambulation/Gait Assistance  5: Supervision;4: Min guard    Ambulation/Gait Assistance Details  verbal, tactile & demo cues on step thru pattern but if does not complete step thru then do not force prosthetic knee to flex for swing.  Terminal swing go ahead & touch prosthetic foot to ground otherwise creates a single leg stance situation and result in balance issue.  worked on carrying plate while ambulating with cane.     Ambulation Distance (Feet)  75 Feet   75' X 3 and 125'   Assistive device  Prosthesis;Rollator;Straight cane   cane with rubber quad tip   Gait Pattern  Step-through pattern;Decreased step length - right;Decreased stance time - left;Decreased stride length;Decreased hip/knee flexion - left;Trendelenburg;Poor foot clearance - left    Ambulation Surface  Indoor;Level    Stairs  --    Stairs Assistance  --    Stair Management Technique  --    Number of Stairs  --    Ramp  4: Min assist   cane & TFA prosthesis   Ramp Details (indicate cue type and reason)  verbal & tactile cues on technique    Curb  4: Min assist   cane & TFA prosthesis   Curb  Details (indicate cue type and reason)  cues on technique      High Level Balance   High Level Balance Activities  Backward walking   cane & TFA prosthesis     Prosthetics   Prosthetic Care Comments   reports wearing prosthesis to/from dialysis. It has been weighed appropriately.     Current prosthetic wear tolerance (days/week)   daily    Current prosthetic wear tolerance (#hours/day)   >/= 8 hours a day non dialysis days, >/= 75% of awake hr on diaylsis days but naps after dialysis    Residual limb condition   No skin issues per pt report               PT Short Term Goals - 01/26/19 0935      PT SHORT TERM GOAL #1   Title  Patient verbalizes proper prosthesis wear at dialysis including weighing in & out.  (All updated STGs Target Date: 01/28/2019)    Baseline  01/26/19: met- pt is wearing prosthesis to/from dialysis, it has been appropriately weighed.    Status  Achieved    Target Date  --      PT SHORT TERM GOAL #2   Title  patient reports prosthesis wear daily on days leaving home including dialysis for >8 hrs & on days staying home >50% of days for 4hours.  .    Baseline  01/26/19: met today    Time  --    Period  --    Status  Achieved    Target Date  --      PT SHORT TERM GOAL #3   Title  Patient able to safely pick up items from floor with walker support and stands 2 minutes without UE support with supervision.    Baseline  01/26/19: met today    Time  --    Period  --    Status  Achieved    Target Date  --      PT SHORT TERM GOAL #4   Title  Patient ambulates 500' outdoors with 1 seated rest for 5 minutes on paved surfaces with rollator walker & prosthesis with supervision.    Baseline  01/26/19: met  today with no rest break needed    Time  --    Period  --    Status  Achieved    Target Date  01/28/19      PT SHORT TERM GOAL #5   Title  Patient negotiates stairs with single rail & cane with prosthesis with supervision.    Baseline  01/26/19: met today     Time  --    Period  --    Status  Achieved    Target Date  01/28/19        PT Long Term Goals - 01/07/19 1858      PT LONG TERM GOAL #1   Title  Patient verbalizes & demonstrates proper prosthetic care with new prosthetic design including prosthesis use at dialysis to enable safe use of prosthesis. (All LTGs Target Date: 02/25/2019)    Time  3    Period  Months    Status  On-going      PT LONG TERM GOAL #2   Title  Patient tolerates & reports prosthesis wear daily >75% of awake hours without skin issues or limb pain.     Time  3    Period  Months    Status  On-going      PT LONG TERM GOAL #3   Title  Patient ambulates 500' with one seated rest <3 minutes outdoors with rollator walker & prosthesis modified independent for community mobility.     Time  3    Period  Months    Status  On-going      PT LONG TERM GOAL #4   Title  Patient negotiates ramps, curbs with rollator walker & stairs 1 rail/cane & prosthesis modified independent to enable community mobility.     Time  3    Period  Months    Status  On-going      PT LONG TERM GOAL #5   Title  Improve standing balance with tasks of Berg Balance Test with walker support >50/56    Time  3    Period  Months    Status  On-going      PT LONG TERM GOAL #6   Title  Patient ambulates 72' with cane quad tip & prosthesis with supervision.    Time  3    Period  Months    Status  New            Plan - 02/07/19 1204    Clinical Impression Statement  Today's skilled session focused on prosthetic gait with cane household around furniture carrying plate of objects. Also worked on Psychologist, occupational for basic community (limited distance) with ability to negotiate ramps & curbs with cane.    Rehab Potential  Good    PT Frequency  2x / week    PT Duration  12 weeks    PT Treatment/Interventions  ADLs/Self Care Home Management;Moist Heat;DME Instruction;Gait training;Stair training;Functional mobility training;Therapeutic  activities;Therapeutic exercise;Balance training;Neuromuscular re-education;Patient/family education;Prosthetic Training;Manual techniques;Vestibular    PT Next Visit Plan  work towards [place].    Consulted and Agree with Plan of Care  Patient       Patient will benefit from skilled therapeutic intervention in order to improve the following deficits and impairments:  Abnormal gait, Decreased activity tolerance, Decreased balance, Decreased endurance, Decreased knowledge of use of DME, Decreased mobility, Decreased strength, Decreased range of motion, Impaired flexibility, Prosthetic Dependency, Postural dysfunction, Increased edema, Dizziness  Visit Diagnosis: Other abnormalities of gait and mobility  Muscle  weakness (generalized)  Abnormal posture  Unsteadiness on feet  Pain in left leg  History of falling     Problem List Patient Active Problem List   Diagnosis Date Noted  . On statin therapy due to risk of future cardiovascular event 12/10/2018  . Benign essential HTN   . Stage 5 chronic kidney disease not on chronic dialysis ([facility])   . Above knee amputation status, left   . DM type 2 causing CKD stage 5 ([facility]) 03/21/2017  . Anemia of chronic disease 02/05/2017  . Dysplasia of cervix, high grade CIN 2 01/12/2017  . Chronic diastolic CHF (congestive heart failure) ([facility]) 11/08/2015  . Total knee replacement status   . Diabetic peripheral neuropathy ([facility])   . Major depression, recurrent, chronic ([place])   . Chronic kidney disease (CKD), stage IV (severe) ([facility])   . Hepatitis C 08/16/2014  . Peripheral neuropathy 06/24/2013  . Compulsive tobacco user syndrome 06/24/2013    Jamey Reas PT, DPT 02/07/2019, 12:06 PM  [facility] [address] [place], [place], [address] Phone: [phone]   Fax:  [phone]  Name: Brandy Houston MRN: [id number] Date of Birth: 05-30-1956

## 2019-02-08 DIAGNOSIS — E1129 Type 2 diabetes mellitus with other diabetic kidney complication: Secondary | ICD-10-CM | POA: Diagnosis not present

## 2019-02-08 DIAGNOSIS — Z23 Encounter for immunization: Secondary | ICD-10-CM | POA: Diagnosis not present

## 2019-02-08 DIAGNOSIS — Z992 Dependence on renal dialysis: Secondary | ICD-10-CM | POA: Diagnosis not present

## 2019-02-08 DIAGNOSIS — N186 End stage renal disease: Secondary | ICD-10-CM | POA: Diagnosis not present

## 2019-02-08 DIAGNOSIS — N2581 Secondary hyperparathyroidism of renal origin: Secondary | ICD-10-CM | POA: Diagnosis not present

## 2019-02-08 DIAGNOSIS — D509 Iron deficiency anemia, unspecified: Secondary | ICD-10-CM | POA: Diagnosis not present

## 2019-02-09 ENCOUNTER — Ambulatory Visit: Payer: Medicare Other | Admitting: Physical Therapy

## 2019-02-10 DIAGNOSIS — Z23 Encounter for immunization: Secondary | ICD-10-CM | POA: Diagnosis not present

## 2019-02-10 DIAGNOSIS — N186 End stage renal disease: Secondary | ICD-10-CM | POA: Diagnosis not present

## 2019-02-10 DIAGNOSIS — D509 Iron deficiency anemia, unspecified: Secondary | ICD-10-CM | POA: Diagnosis not present

## 2019-02-10 DIAGNOSIS — E1129 Type 2 diabetes mellitus with other diabetic kidney complication: Secondary | ICD-10-CM | POA: Diagnosis not present

## 2019-02-10 DIAGNOSIS — N2581 Secondary hyperparathyroidism of renal origin: Secondary | ICD-10-CM | POA: Diagnosis not present

## 2019-02-10 DIAGNOSIS — Z992 Dependence on renal dialysis: Secondary | ICD-10-CM | POA: Diagnosis not present

## 2019-02-12 DIAGNOSIS — D509 Iron deficiency anemia, unspecified: Secondary | ICD-10-CM | POA: Diagnosis not present

## 2019-02-12 DIAGNOSIS — N2581 Secondary hyperparathyroidism of renal origin: Secondary | ICD-10-CM | POA: Diagnosis not present

## 2019-02-12 DIAGNOSIS — E1129 Type 2 diabetes mellitus with other diabetic kidney complication: Secondary | ICD-10-CM | POA: Diagnosis not present

## 2019-02-12 DIAGNOSIS — Z23 Encounter for immunization: Secondary | ICD-10-CM | POA: Diagnosis not present

## 2019-02-12 DIAGNOSIS — Z992 Dependence on renal dialysis: Secondary | ICD-10-CM | POA: Diagnosis not present

## 2019-02-12 DIAGNOSIS — N186 End stage renal disease: Secondary | ICD-10-CM | POA: Diagnosis not present

## 2019-02-13 ENCOUNTER — Other Ambulatory Visit: Payer: Self-pay | Admitting: Family Medicine

## 2019-02-14 ENCOUNTER — Ambulatory Visit: Payer: Medicare Other | Admitting: Physical Therapy

## 2019-02-15 DIAGNOSIS — R262 Difficulty in walking, not elsewhere classified: Secondary | ICD-10-CM | POA: Diagnosis not present

## 2019-02-15 DIAGNOSIS — N186 End stage renal disease: Secondary | ICD-10-CM | POA: Diagnosis not present

## 2019-02-15 DIAGNOSIS — R2681 Unsteadiness on feet: Secondary | ICD-10-CM | POA: Diagnosis not present

## 2019-02-15 DIAGNOSIS — Z992 Dependence on renal dialysis: Secondary | ICD-10-CM | POA: Diagnosis not present

## 2019-02-15 DIAGNOSIS — Z23 Encounter for immunization: Secondary | ICD-10-CM | POA: Diagnosis not present

## 2019-02-15 DIAGNOSIS — D631 Anemia in chronic kidney disease: Secondary | ICD-10-CM | POA: Diagnosis not present

## 2019-02-15 DIAGNOSIS — E1129 Type 2 diabetes mellitus with other diabetic kidney complication: Secondary | ICD-10-CM | POA: Diagnosis not present

## 2019-02-15 DIAGNOSIS — M79605 Pain in left leg: Secondary | ICD-10-CM | POA: Diagnosis not present

## 2019-02-15 DIAGNOSIS — R2689 Other abnormalities of gait and mobility: Secondary | ICD-10-CM | POA: Diagnosis not present

## 2019-02-15 DIAGNOSIS — R293 Abnormal posture: Secondary | ICD-10-CM | POA: Diagnosis not present

## 2019-02-15 DIAGNOSIS — D509 Iron deficiency anemia, unspecified: Secondary | ICD-10-CM | POA: Diagnosis not present

## 2019-02-15 DIAGNOSIS — N2581 Secondary hyperparathyroidism of renal origin: Secondary | ICD-10-CM | POA: Diagnosis not present

## 2019-02-15 DIAGNOSIS — M6281 Muscle weakness (generalized): Secondary | ICD-10-CM | POA: Diagnosis not present

## 2019-02-16 ENCOUNTER — Other Ambulatory Visit: Payer: Self-pay

## 2019-02-16 ENCOUNTER — Ambulatory Visit: Payer: Medicare Other | Attending: Orthopedic Surgery | Admitting: Physical Therapy

## 2019-02-16 ENCOUNTER — Encounter: Payer: Self-pay | Admitting: Physical Therapy

## 2019-02-16 DIAGNOSIS — R2681 Unsteadiness on feet: Secondary | ICD-10-CM

## 2019-02-16 DIAGNOSIS — M79605 Pain in left leg: Secondary | ICD-10-CM | POA: Insufficient documentation

## 2019-02-16 DIAGNOSIS — R262 Difficulty in walking, not elsewhere classified: Secondary | ICD-10-CM | POA: Diagnosis not present

## 2019-02-16 DIAGNOSIS — M6281 Muscle weakness (generalized): Secondary | ICD-10-CM | POA: Diagnosis not present

## 2019-02-16 DIAGNOSIS — R293 Abnormal posture: Secondary | ICD-10-CM | POA: Diagnosis not present

## 2019-02-16 DIAGNOSIS — R2689 Other abnormalities of gait and mobility: Secondary | ICD-10-CM | POA: Diagnosis not present

## 2019-02-16 NOTE — Therapy (Signed)
[facility] [address] [place], [place], [address] Phone: [phone]   Fax:  [phone]  Physical Therapy Treatment  Patient Details  Name: Brandy Houston MRN: [id number] Date of Birth: Aug 04, 1955 Referring Provider (PT): Meridee Score, MD   Encounter Date: 02/16/2019   CLINIC OPERATION CHANGES: [facility] is open at lower capacity following universal masking, social distancing, and patient screening.  [name] COVID risk of complications score is 5.   PT End of Session - 02/16/19 1428    Visit Number  33   progress note /recert done at visit 29   Number of Visits  40    Date for PT Re-Evaluation  02/25/19    Authorization Type  Medicare & Medicaid    PT Start Time  1230    PT Stop Time  1315    PT Time Calculation (min)  45 min    Equipment Utilized During Treatment  Gait belt    Activity Tolerance  Patient tolerated treatment well    Behavior During Therapy  WFL for tasks assessed/performed       Past Medical History:  Diagnosis Date  . Anemia   . Anxiety   . Arthritis    "in my joints" (03/10/2017)  . Chronic diastolic CHF (congestive heart failure) ([facility])   . CKD (chronic kidney disease), stage IV ([facility])    stage IV. previous HD, none currently 10/30/15 (confirmed 02/05/2017 & 03/10/2017)  . Depression    Chronic  . Diabetic peripheral neuropathy ([place])   . Dysrhythmia    tachycardia, normal ECHO 08-09-14  . GERD (gastroesophageal reflux disease)   . Heart murmur     dx'd 02/05/2017  . Hepatitis C    "tx'd in 2016; I'm negative now" (02/05/2017)  . History of blood transfusion 2017   "w/knee replacement"  . Hypertension   . Osteoarthritis of left knee   . Peripheral neuropathy   . Protein calorie malnutrition ([place])   . Septic arthritis ([place])   . Slow transit constipation   . Type II diabetes mellitus ([name])    IDDM  . Uncontrolled hypertension 02/18/2013  . Unsteady gait     Past  Surgical History:  Procedure Laterality Date  . A/V FISTULAGRAM Right 07/07/2018   Procedure: A/V FISTULAGRAM;  Surgeon: Marty Heck, MD;  Location: [facility];  Service: Cardiovascular;  Laterality: Right;  . AMPUTATION Left 03/20/2017   Procedure: LEFT ABOVE KNEE AMPUTATION;  Surgeon: Newt Minion, MD;  Location: [facility];  Service: Orthopedics;  Laterality: Left;  . APPENDECTOMY    . AV FISTULA PLACEMENT Left 09/13/2014   Procedure: Brachial Artery to Brachial Vein Gortex Four - Seven Stretch GRAFT INSERTION Left Forearm;  Surgeon: Mal Misty, MD;  Location: [facility];  Service: Vascular;  Laterality: Left;  . BASCILIC VEIN TRANSPOSITION Right 10/26/2017   Procedure: BASILIC VEIN TRANSPOSITION FIRST STAGE RIGHT ARM;  Surgeon: Conrad Fairbanks, MD;  Location: [facility];  Service: Vascular;  Laterality: Right;  . BASCILIC VEIN TRANSPOSITION Right 02/01/2018   Procedure: SECOND STAGE BASILIC VEIN TRANSPOSITION RIGHT UPPER EXTREMITY;  Surgeon: Marty Heck, MD;  Location: [facility];  Service: Vascular;  Laterality: Right;  . CHOLECYSTECTOMY OPEN    . COLON SURGERY    . EXCISIONAL TOTAL KNEE ARTHROPLASTY WITH ANTIBIOTIC SPACERS Left 02/06/2017   Procedure: Incisional total iknee with antibiotic spacer ;  Surgeon: Leandrew Koyanagi, MD;  Location: [facility];  Service: Orthopedics;  Laterality: Left;  . IR FLUORO GUIDE CV LINE RIGHT  02/10/2017  . IR REMOVAL TUN CV CATH W/O FL  04/01/2017  . IR US GUIDE VASC ACCESS RIGHT  02/10/2017  . IRRIGATION AND DEBRIDEMENT KNEE Left 03/12/2017   Procedure: IRRIGATION AND DEBRIDEMENT LEFT KNEE WITH WOUND VAC APPLICATION;  Surgeon: Leandrew Koyanagi, MD;  Location: [facility];  Service: Orthopedics;  Laterality: Left;  . JOINT REPLACEMENT    . KNEE ARTHROSCOPY Right 08/10/2014   Procedure: ARTHROSCOPY I & D KNEE;  Surgeon: Marianna Payment, MD;  Location: [facility];  Service: Orthopedics;  Laterality: Right;  . KNEE ARTHROSCOPY Left 08/11/2014   Procedure: ARTHROSCOPIC  WASHOUT LEFT KNEE;  Surgeon: Marianna Payment, MD;  Location: [facility];  Service: Orthopedics;  Laterality: Left;  . KNEE ARTHROSCOPY Left 08/19/2014   Procedure: ARTHROSCOPIC WASHOUT LEFT KNEE;  Surgeon: Leandrew Koyanagi, MD;  Location: [facility];  Service: Orthopedics;  Laterality: Left;  . KNEE ARTHROSCOPY WITH LATERAL MENISECTOMY Left 04/04/2015   Procedure: AND PARTIAL LATERAL MENISECTOMY;  Surgeon: Leandrew Koyanagi, MD;  Location: [facility];  Service: Orthopedics;  Laterality: Left;  . KNEE ARTHROSCOPY WITH MEDIAL MENISECTOMY Left 04/04/2015   Procedure: LEFT KNEE ARTHROSCOPY WITH PARTIAL MEDIAL MENISCECTOMY  AND SYNOVECTOMY;  Surgeon: Leandrew Koyanagi, MD;  Location: [facility];  Service: Orthopedics;  Laterality: Left;  . PERIPHERAL VASCULAR BALLOON ANGIOPLASTY  07/07/2018   Procedure: PERIPHERAL VASCULAR BALLOON ANGIOPLASTY;  Surgeon: Marty Heck, MD;  Location: [facility];  Service: Cardiovascular;;  right AV fistula  . SHOULDER ARTHROSCOPY Bilateral 08/10/2014   Procedure: I & D BILATERAL SHOULDERS ;  Surgeon: Marianna Payment, MD;  Location: [facility];  Service: Orthopedics;  Laterality: Bilateral;  . SMALL INTESTINE SURGERY     Due to Small Bowel Obstruction; "fixed it when they did my gallbladder OR"  . TEE WITHOUT CARDIOVERSION N/A 08/14/2014   Procedure: TRANSESOPHAGEAL ECHOCARDIOGRAM (TEE);  Surgeon: Thayer Headings, MD;  Location: [facility];  Service: Cardiovascular;  Laterality: N/A;  . TENOSYNOVECTOMY Right 08/11/2014   Procedure: RIGHT WRIST IRRIGATION AND DEBRIDEMENT, TENOSYNOVECTOMY;  Surgeon: Marianna Payment, MD;  Location: [facility];  Service: Orthopedics;  Laterality: Right;  . TOTAL KNEE ARTHROPLASTY Left 11/07/2015   Procedure: LEFT TOTAL KNEE ARTHROPLASTY WITH REVISION OF IMPLANTS;  Surgeon: Leandrew Koyanagi, MD;  Location: [facility];  Service: Orthopedics;  Laterality: Left;  . TUBAL LIGATION      There were no vitals filed for this  visit.  Subjective Assessment - 02/16/19 1234    Subjective  No falls. She went to visit her dtr. She walked a lot with rollator walker.  She did stairs at granddaughter's. She is having surgery next week on Wednesday so needs to wrap up PT this week.    Pertinent History  left TFA, DM, neuropathy, Hep C, HTN, CHF, depression, CKD st 5 not dialysis, arthritis, heart murmur,     Limitations  Standing;Walking;House hold activities    Patient Stated Goals  To walk with prosthesis in home & community, travel plane, car & cruise in September    Currently in Pain?  No/denies                       [facility] Adult PT Treatment/Exercise - 02/16/19 1230      Transfers   Transfers  Sit to Stand;Stand to [employer] Transfers    Sit to Stand  6:  Modified independent (Device/Increase time);With upper extremity assist;From chair/3-in-1   chairs without armrests to cane   Stand to Sit  6: Modified independent (Device/Increase time);With upper extremity assist;To chair/3-in-1   chairs without armrests from cane     Ambulation/Gait   Ambulation/Gait  Yes    Ambulation/Gait Assistance  6: Modified independent (Device/Increase time);5: Supervision   Modified Independent with rollator & supervision cane   Ambulation Distance (Feet)  500 Feet   500' with 1 seated rests rollator, 44' X 2 cane quad tip   Assistive device  Prosthesis;Rollator;Straight cane   cane with rubber quad tip   Gait Pattern  Step-through pattern;Decreased step length - right;Decreased stance time - left;Decreased stride length;Decreased hip/knee flexion - left;Trendelenburg;Poor foot clearance - left    Ambulation Surface  Level;Indoor;Outdoor;Paved    Ramp  4: Min assist;6: Modified independent (Device);Other (comment)   MinA cane & TFA prosthesis, mod indep rollator   Ramp Details (indicate cue type and reason)  cues on technique with cane & TFA prosthesis    Curb  4: Min assist;6: Modified independent  (Device/increase time)   MinA cane & TFA prosthesis, mod indep rollator   Curb Details (indicate cue type and reason)  cues on technique with cane & TFA prosthesis      High Level Balance   High Level Balance Activities  --      Prosthetics   Prosthetic Care Comments   reports prosthesis slips off when looses suction suspension.  PT reviewed proper donning of liner & prosthesis.  PT also reviewed adjusting ply socks especially from before to after dialysis as limb will also have fluid pulled.     Current prosthetic wear tolerance (days/week)   daily    Current prosthetic wear tolerance (#hours/day)   Most of awake hours daily    Residual limb condition   No skin issues    Education Provided  Correct ply sock adjustment;Proper Donning    Person(s) Educated  Patient    Education Method  Explanation;Demonstration;Tactile cues;Verbal cues    Education Method  Verbalized understanding    Donning Prosthesis  Modified independent (device/increased time)    Doffing Prosthesis  Modified independent (device/increased time)               PT Short Term Goals - 01/26/19 0935      PT SHORT TERM GOAL #1   Title  Patient verbalizes proper prosthesis wear at dialysis including weighing in & out.  (All updated STGs Target Date: 01/28/2019)    Baseline  01/26/19: met- pt is wearing prosthesis to/from dialysis, it has been appropriately weighed.    Status  Achieved    Target Date  --      PT SHORT TERM GOAL #2   Title  patient reports prosthesis wear daily on days leaving home including dialysis for >8 hrs & on days staying home >50% of days for 4hours.  .    Baseline  01/26/19: met today    Time  --    Period  --    Status  Achieved    Target Date  --      PT SHORT TERM GOAL #3   Title  Patient able to safely pick up items from floor with walker support and stands 2 minutes without UE support with supervision.    Baseline  01/26/19: met today    Time  --    Period  --    Status  Achieved     Target  Date  --      PT SHORT TERM GOAL #4   Title  Patient ambulates 500' outdoors with 1 seated rest for 5 minutes on paved surfaces with rollator walker & prosthesis with supervision.    Baseline  01/26/19: met today with no rest break needed    Time  --    Period  --    Status  Achieved    Target Date  01/28/19      PT SHORT TERM GOAL #5   Title  Patient negotiates stairs with single rail & cane with prosthesis with supervision.    Baseline  01/26/19: met today    Time  --    Period  --    Status  Achieved    Target Date  01/28/19        PT Long Term Goals - 02/16/19 2136      PT LONG TERM GOAL #1   Title  Patient verbalizes & demonstrates proper prosthetic care with new prosthetic design including prosthesis use at dialysis to enable safe use of prosthesis. (All LTGs Target Date: 02/25/2019)    Baseline  MET 02/16/2019    Time  3    Period  Months    Status  Achieved      PT LONG TERM GOAL #2   Title  Patient tolerates & reports prosthesis wear daily >75% of awake hours without skin issues or limb pain.     Baseline  MET 02/16/2019    Time  3    Period  Months    Status  Achieved      PT LONG TERM GOAL #3   Title  Patient ambulates 500' with one seated rest <3 minutes outdoors with rollator walker & prosthesis modified independent for community mobility.     Baseline  MET 02/16/2019    Time  3    Period  Months    Status  Achieved      PT LONG TERM GOAL #4   Title  Patient negotiates ramps, curbs with rollator walker & stairs 1 rail/cane & prosthesis modified independent to enable community mobility.     Baseline  MET 02/16/2019    Time  3    Period  Months    Status  Achieved      PT LONG TERM GOAL #5   Title  Improve standing balance with tasks of [place] Balance Test with walker support >50/56    Time  3    Period  Months    Status  On-going      PT LONG TERM GOAL #6   Title  Patient ambulates 83' with cane quad tip & prosthesis with modified independent     Time  3    Period  Months    Status  On-going            Plan - 02/16/19 2138    Clinical Impression Statement  Patient met 4 LTGs checked today. She has improved her mobility with PT. PT instructed in need to continue HEP including walking program with rollator. After 2-3 months of further strengthening & endurance work, she may be able to progress her mobility further. Currently she appears safe with cane at household level but could progress to limited community.    Rehab Potential  Good    PT Frequency  2x / week    PT Duration  12 weeks    PT Treatment/Interventions  ADLs/Self Care Home Management;Moist Heat;DME Instruction;Gait training;Stair training;Functional mobility  training;Therapeutic activities;Therapeutic exercise;Balance training;Neuromuscular re-education;Patient/family education;Prosthetic Training;Manual techniques;Vestibular    PT Next Visit Plan  Assess remaining 2 LTGs and discharge    Consulted and Agree with Plan of Care  Patient       Patient will benefit from skilled therapeutic intervention in order to improve the following deficits and impairments:  Abnormal gait, Decreased activity tolerance, Decreased balance, Decreased endurance, Decreased knowledge of use of DME, Decreased mobility, Decreased strength, Decreased range of motion, Impaired flexibility, Prosthetic Dependency, Postural dysfunction, Increased edema, Dizziness  Visit Diagnosis: Other abnormalities of gait and mobility  Muscle weakness (generalized)  Abnormal posture  Unsteadiness on feet  Pain in left leg     Problem List Patient Active Problem List   Diagnosis Date Noted  . On statin therapy due to risk of future cardiovascular event 12/10/2018  . Benign essential HTN   . 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 Above knee amputation status, left   . DM type 2 causing CKD stage 5 ([place]) 03/21/2017  . Anemia of chronic disease 02/05/2017  . Dysplasia of  cervix, high grade CIN 2 01/12/2017  . Chronic diastolic CHF (congestive heart failure) ([facility]) 11/08/2015  . Total knee replacement status   . Diabetic peripheral neuropathy ([place])   . Major depression, recurrent, chronic ([place])   . Chronic kidney disease (CKD), stage IV (severe) ([place])   . Hepatitis C 08/16/2014  . Peripheral neuropathy 06/24/2013  . Compulsive tobacco user syndrome 06/24/2013    Jamey Reas PT, DPT 02/16/2019, 9:43 PM  [facility] [address] [place], [place], [address] Phone: [phone]   Fax:  [phone]  Name: Brandy Houston MRN: [id number] Date of Birth: 11-06-1955

## 2019-02-17 DIAGNOSIS — Z992 Dependence on renal dialysis: Secondary | ICD-10-CM | POA: Diagnosis not present

## 2019-02-17 DIAGNOSIS — N186 End stage renal disease: Secondary | ICD-10-CM | POA: Diagnosis not present

## 2019-02-17 DIAGNOSIS — D631 Anemia in chronic kidney disease: Secondary | ICD-10-CM | POA: Diagnosis not present

## 2019-02-17 DIAGNOSIS — E1129 Type 2 diabetes mellitus with other diabetic kidney complication: Secondary | ICD-10-CM | POA: Diagnosis not present

## 2019-02-17 DIAGNOSIS — N2581 Secondary hyperparathyroidism of renal origin: Secondary | ICD-10-CM | POA: Diagnosis not present

## 2019-02-17 DIAGNOSIS — D509 Iron deficiency anemia, unspecified: Secondary | ICD-10-CM | POA: Diagnosis not present

## 2019-02-18 ENCOUNTER — Other Ambulatory Visit: Payer: Self-pay

## 2019-02-18 ENCOUNTER — Encounter: Payer: Self-pay | Admitting: Physical Therapy

## 2019-02-18 ENCOUNTER — Ambulatory Visit: Payer: Medicare Other | Admitting: Physical Therapy

## 2019-02-18 DIAGNOSIS — R2689 Other abnormalities of gait and mobility: Secondary | ICD-10-CM

## 2019-02-18 DIAGNOSIS — M6281 Muscle weakness (generalized): Secondary | ICD-10-CM

## 2019-02-18 DIAGNOSIS — R2681 Unsteadiness on feet: Secondary | ICD-10-CM | POA: Diagnosis not present

## 2019-02-18 DIAGNOSIS — M79605 Pain in left leg: Secondary | ICD-10-CM | POA: Diagnosis not present

## 2019-02-18 DIAGNOSIS — R262 Difficulty in walking, not elsewhere classified: Secondary | ICD-10-CM | POA: Diagnosis not present

## 2019-02-18 DIAGNOSIS — R293 Abnormal posture: Secondary | ICD-10-CM | POA: Diagnosis not present

## 2019-02-19 NOTE — Therapy (Addendum)
[facility] [address] [place], [place], [address] Phone: [phone]   Fax:  [phone]  Physical Therapy Treatment & Discharge Summary  Patient Details  Name: Brandy Houston MRN: [id number] Date of Birth: Mar 15, 1956 Referring Provider (PT): Meridee Score, MD  PHYSICAL THERAPY DISCHARGE SUMMARY  Visits from Start of Care: 41  Current functional level related to goals / functional outcomes: Patient met or partially met all LTGs.   Remaining deficits: She has improved her mobility with PT. PT instructed in need to continue HEP including walking program with rollator.She has strength, balance & endurance deficits limiting mobility without walker for community activities. After 2-3 months of further strengthening & endurance work, she may be able to progress her mobility further. Currently she appears safe with cane at household level but could progress to limited community.    Education / Equipment: Prosthetic care & ongoing HEP/community fitness  Plan: Patient agrees to discharge.  Patient goals were partially met. Patient is being discharged due to meeting the stated rehab goals.  ?????         Jamey Reas, PT, DPT PT Specializing in [place] 03/08/19 8:36 AM Phone:  [phone]  Fax:  [phone] [facility] [address] [place], [place] [address]  Encounter Date: 02/18/2019  PT End of Session - 02/18/19 1021    Visit Number  34   progress note /recert done at visit 29   Number of Visits  40    Date for PT Re-Evaluation  02/25/19    Authorization Type  Medicare & Medicaid    PT Start Time  1018    PT Stop Time  1052   discharge visit, not all the time was needed   PT Time Calculation (min)  34 min    Equipment Utilized During Treatment  Gait belt    Activity Tolerance  Patient tolerated treatment well    Behavior During Therapy  WFL for tasks  assessed/performed       Past Medical History:  Diagnosis Date  . Anemia   . Anxiety   . Arthritis    "in my joints" (03/10/2017)  . Chronic diastolic CHF (congestive heart failure) ([facility])   . CKD (chronic kidney disease), stage IV ([facility])    stage IV. previous HD, none currently 10/30/15 (confirmed 02/05/2017 & 03/10/2017)  . Depression    Chronic  . Diabetic peripheral neuropathy ([facility])   . Dysrhythmia    tachycardia, normal ECHO 08-09-14  . GERD (gastroesophageal reflux disease)   . Heart murmur     dx'd 02/05/2017  . Hepatitis C    "tx'd in 2016; I'm negative now" (02/05/2017)  . History of blood transfusion 2017   "w/knee replacement"  . Hypertension   . Osteoarthritis of left knee   . Peripheral neuropathy   . Protein calorie malnutrition ([facility])   . Septic arthritis ([facility])   . Slow transit constipation   . Type II diabetes mellitus ([name])    IDDM  . Uncontrolled hypertension 02/18/2013  . Unsteady gait     Past Surgical History:  Procedure Laterality Date  . A/V FISTULAGRAM Right 07/07/2018   Procedure: A/V FISTULAGRAM;  Surgeon: Marty Heck, MD;  Location: [facility];  Service: Cardiovascular;  Laterality: Right;  . AMPUTATION Left 03/20/2017   Procedure: LEFT ABOVE KNEE AMPUTATION;  Surgeon: Newt Minion, MD;  Location: [facility];  Service: Orthopedics;  Laterality: Left;  . APPENDECTOMY    .  AV FISTULA PLACEMENT Left 09/13/2014   Procedure: Brachial Artery to Brachial Vein Gortex Four - Seven Stretch GRAFT INSERTION Left Forearm;  Surgeon: Mal Misty, MD;  Location: [facility];  Service: Vascular;  Laterality: Left;  . BASCILIC VEIN TRANSPOSITION Right 10/26/2017   Procedure: BASILIC VEIN TRANSPOSITION FIRST STAGE RIGHT ARM;  Surgeon: Conrad Rockwood, MD;  Location: [facility];  Service: Vascular;  Laterality: Right;  . BASCILIC VEIN TRANSPOSITION Right 02/01/2018   Procedure: SECOND STAGE BASILIC VEIN TRANSPOSITION RIGHT UPPER EXTREMITY;  Surgeon: Marty Heck,  MD;  Location: [facility];  Service: Vascular;  Laterality: Right;  . CHOLECYSTECTOMY OPEN    . COLON SURGERY    . EXCISIONAL TOTAL KNEE ARTHROPLASTY WITH ANTIBIOTIC SPACERS Left 02/06/2017   Procedure: Incisional total iknee with antibiotic spacer ;  Surgeon: Leandrew Koyanagi, MD;  Location: [facility];  Service: Orthopedics;  Laterality: Left;  . IR FLUORO GUIDE CV LINE RIGHT  02/10/2017  . IR REMOVAL TUN CV CATH W/O FL  04/01/2017  . IR US GUIDE VASC ACCESS RIGHT  02/10/2017  . IRRIGATION AND DEBRIDEMENT KNEE Left 03/12/2017   Procedure: IRRIGATION AND DEBRIDEMENT LEFT KNEE WITH WOUND VAC APPLICATION;  Surgeon: Leandrew Koyanagi, MD;  Location: [facility];  Service: Orthopedics;  Laterality: Left;  . JOINT REPLACEMENT    . KNEE ARTHROSCOPY Right 08/10/2014   Procedure: ARTHROSCOPY I & D KNEE;  Surgeon: Marianna Payment, MD;  [facility];  Service: Orthopedics;  Laterality: Right;  . KNEE ARTHROSCOPY Left 08/11/2014   Procedure: ARTHROSCOPIC WASHOUT LEFT KNEE;  Surgeon: Marianna Payment, MD;  Location: [facility];  Service: Orthopedics;  Laterality: Left;  . KNEE ARTHROSCOPY Left 08/19/2014   Procedure: ARTHROSCOPIC WASHOUT LEFT KNEE;  Surgeon: Leandrew Koyanagi, MD;  Location: [facility];  Service: Orthopedics;  Laterality: Left;  . KNEE ARTHROSCOPY WITH LATERAL MENISECTOMY Left 04/04/2015   Procedure: AND PARTIAL LATERAL MENISECTOMY;  Surgeon: Leandrew Koyanagi, MD;  Location: [facility];  Service: Orthopedics;  Laterality: Left;  . KNEE ARTHROSCOPY WITH MEDIAL MENISECTOMY Left 04/04/2015   Procedure: LEFT KNEE ARTHROSCOPY WITH PARTIAL MEDIAL MENISCECTOMY  AND SYNOVECTOMY;  Surgeon: Leandrew Koyanagi, MD;  Location: [facility];  Service: Orthopedics;  Laterality: Left;  . PERIPHERAL VASCULAR BALLOON ANGIOPLASTY  07/07/2018   Procedure: PERIPHERAL VASCULAR BALLOON ANGIOPLASTY;  Surgeon: Marty Heck, MD;  Location: [facility];  Service: Cardiovascular;;  right AV fistula  . SHOULDER  ARTHROSCOPY Bilateral 08/10/2014   Procedure: I & D BILATERAL SHOULDERS ;  Surgeon: Marianna Payment, MD;  Location[facility];  Service: Orthopedics;  Laterality: Bilateral;  . SMALL INTESTINE SURGERY     Due to Small Bowel Obstruction; "fixed it when they did my gallbladder OR"  . TEE WITHOUT CARDIOVERSION N/A 08/14/2014   Procedure: TRANSESOPHAGEAL ECHOCARDIOGRAM (TEE);  Surgeon: Thayer Headings, MD;  Location: [facility];  Service: Cardiovascular;  Laterality: N/A;  . TENOSYNOVECTOMY Right 08/11/2014   Procedure: RIGHT WRIST IRRIGATION AND DEBRIDEMENT, TENOSYNOVECTOMY;  Surgeon: Marianna Payment, MD;  Location: [facility];  Service: Orthopedics;  Laterality: Right;  . TOTAL KNEE ARTHROPLASTY Left 11/07/2015   Procedure: LEFT TOTAL KNEE ARTHROPLASTY WITH REVISION OF IMPLANTS;  Surgeon: Leandrew Koyanagi, MD;  Location: [facility];  Service: Orthopedics;  Laterality: Left;  . TUBAL LIGATION      There were no vitals filed for this visit.  Subjective Assessment - 02/18/19 1020    Subjective  No  new complaints. No falls or pain to report.    Pertinent History  left TFA, DM, neuropathy, Hep C, HTN, CHF, depression, CKD st 5 not dialysis, arthritis, heart murmur,     Limitations  Standing;Walking;House hold activities    Patient Stated Goals  To walk with prosthesis in home & community, travel plane, car & cruise in September    Currently in Pain?  No/denies         [facility] PT Assessment - 02/18/19 1023      Berg Balance Test   Sit to Stand  Able to stand without using hands and stabilize independently   no rollator   Standing Unsupported  Able to stand safely 2 minutes   no rollator   Sitting with Back Unsupported but Feet Supported on Floor or Stool  Able to sit safely and securely 2 minutes    Stand to Sit  Controls descent by using hands   no rollator   Transfers  Able to transfer safely, definite need of hands    Standing Unsupported with Eyes Closed  Able to stand 10 seconds safely   no  rollator   Standing Unsupported with Feet Together  Able to place feet together independently and stand 1 minute safely   intermittent touch to rollator   From Standing, Reach Forward with Outstretched Arm  Can reach confidently >25 cm (10")   with single UE support on rollator   From Standing Position, Pick up Object from Floor  Able to pick up shoe safely and easily   single UE support on rollator   From Standing Position, Turn to Look Behind Over each Shoulder  Turn sideways only but maintains balance   with single UE support on rollator   Turn 360 Degrees  Able to turn 360 degrees safely but slowly   with UE support on rollator   Standing Unsupported, Alternately Place Feet on Step/Stool  Able to stand independently and complete 8 steps >20 seconds   with bil UE support   Standing Unsupported, One Foot in Front  Able to plae foot ahead of the other independently and hold 30 seconds   with rollator support to get position   Standing on One Leg  Able to lift leg independently and hold 5-10 seconds   on rollator suppport   Total Score  47    Berg comment:  used locked rollator.           [facility] Adult PT Treatment/Exercise - 02/18/19 1042      Transfers   Transfers  Sit to Stand;Stand to Sit    Sit to Stand  6: Modified independent (Device/Increase time);With upper extremity assist;From chair/3-in-1    Stand to Sit  6: Modified independent (Device/Increase time)      Ambulation/Gait   Ambulation/Gait  Yes    Ambulation/Gait Assistance  6: Modified independent (Device/Increase time);5: Supervision;4: Min assist    Ambulation/Gait Assistance Details  Mod I with rollator. supervision with cane with one episode of imbalance today needing min assist to prevent fall.     Ambulation Distance (Feet)  75 Feet   x2 with cane   Assistive device  Prosthesis;Rollator;Straight cane    Gait Pattern  Step-through pattern;Decreased step length - right;Decreased stance time - left;Decreased stride  length;Decreased hip/knee flexion - left;Trendelenburg;Poor foot clearance - left    Ambulation Surface  Level;Indoor           PT Short Term Goals - 01/26/19 7062  PT SHORT TERM GOAL #1   Title  Patient verbalizes proper prosthesis wear at dialysis including weighing in & out.  (All updated STGs Target Date: 01/28/2019)    Baseline  01/26/19: met- pt is wearing prosthesis to/from dialysis, it has been appropriately weighed.    Status  Achieved    Target Date  --      PT SHORT TERM GOAL #2   Title  patient reports prosthesis wear daily on days leaving home including dialysis for >8 hrs & on days staying home >50% of days for 4hours.  .    Baseline  01/26/19: met today    Time  --    Period  --    Status  Achieved    Target Date  --      PT SHORT TERM GOAL #3   Title  Patient able to safely pick up items from floor with walker support and stands 2 minutes without UE support with supervision.    Baseline  01/26/19: met today    Time  --    Period  --    Status  Achieved    Target Date  --      PT SHORT TERM GOAL #4   Title  Patient ambulates 500' outdoors with 1 seated rest for 5 minutes on paved surfaces with rollator walker & prosthesis with supervision.    Baseline  01/26/19: met today with no rest break needed    Time  --    Period  --    Status  Achieved    Target Date  01/28/19      PT SHORT TERM GOAL #5   Title  Patient negotiates stairs with single rail & cane with prosthesis with supervision.    Baseline  01/26/19: met today    Time  --    Period  --    Status  Achieved    Target Date  01/28/19        PT Long Term Goals - 02/18/19 1021      PT LONG TERM GOAL #1   Title  Patient verbalizes & demonstrates proper prosthetic care with new prosthetic design including prosthesis use at dialysis to enable safe use of prosthesis. (All LTGs Target Date: 02/25/2019)    Baseline  MET 02/16/2019    Status  Achieved      PT LONG TERM GOAL #2   Title  Patient tolerates  & reports prosthesis wear daily >75% of awake hours without skin issues or limb pain.     Baseline  MET 02/16/2019    Status  Achieved      PT LONG TERM GOAL #3   Title  Patient ambulates 500' with one seated rest <3 minutes outdoors with rollator walker & prosthesis modified independent for community mobility.     Baseline  MET 02/16/2019    Status  Achieved      PT LONG TERM GOAL #4   Title  Patient negotiates ramps, curbs with rollator walker & stairs 1 rail/cane & prosthesis modified independent to enable community mobility.     Baseline  MET 02/16/2019    Status  Achieved      PT LONG TERM GOAL #5   Title  Improve standing balance with tasks of Berg Balance Test with walker support >50/56    Baseline  02/18/19: 47/56 scored, improved just not to goal    Status  Partially Met      PT LONG TERM GOAL #6  Title  Patient ambulates 13' with cane quad tip & prosthesis with modified independent    Baseline  02/18/19: pt continues to need up to min assist with balance loss today    Time  --    Period  --    Status  Not Met            Plan - 02/18/19 1021    Clinical Impression Statement  Today's skilled session focused on progress toward remaining LTGs with partially met (Berg Balance test improved to 47/56, just not to goal) and did not meet the goal of Mod I with cane. Pt is in agreement with discharge per PT plan of care today.    Rehab Potential  Good    PT Frequency  2x / week    PT Duration  12 weeks    PT Treatment/Interventions  ADLs/Self Care Home Management;Moist Heat;DME Instruction;Gait training;Stair training;Functional mobility training;Therapeutic activities;Therapeutic exercise;Balance training;Neuromuscular re-education;Patient/family education;Prosthetic Training;Manual techniques;Vestibular    PT Next Visit Plan  discharge today per PT plan of care    Consulted and Agree with Plan of Care  Patient       Patient will benefit from skilled therapeutic intervention in  order to improve the following deficits and impairments:  Abnormal gait, Decreased activity tolerance, Decreased balance, Decreased endurance, Decreased knowledge of use of DME, Decreased mobility, Decreased strength, Decreased range of motion, Impaired flexibility, Prosthetic Dependency, Postural dysfunction, Increased edema, Dizziness  Visit Diagnosis: Other abnormalities of gait and mobility  Muscle weakness (generalized)  Abnormal posture  Unsteadiness on feet  Difficulty in walking, not elsewhere classified     Problem List Patient Active Problem List   Diagnosis Date Noted  . On statin therapy due to risk of future cardiovascular event 12/10/2018  . Benign essential HTN   . Stage 5 chronic kidney disease not on chronic dialysis ([facility])   . Above knee amputation status, left   . DM type 2 causing CKD stage 5 ([facility]) 03/21/2017  . Anemia of chronic disease 02/05/2017  . Dysplasia of cervix, high grade CIN 2 01/12/2017  . Chronic diastolic CHF (congestive heart failure) ([facility]) 11/08/2015  . Total knee replacement status   . Diabetic peripheral neuropathy ([facility])   . Major depression, recurrent, chronic ([facility])   . Chronic kidney disease (CKD), stage IV (severe) ([facility])   . Hepatitis C 08/16/2014  . Peripheral neuropathy 06/24/2013  . Compulsive tobacco user syndrome 06/24/2013    Willow Ora, PTA, [facility] [address] [place], [place] [address] [phone] 02/19/19, 1:55 PM   Name: JAISA DEFINO MRN: [id number] Date of Birth: 04-29-1956

## 2019-02-22 DIAGNOSIS — D631 Anemia in chronic kidney disease: Secondary | ICD-10-CM | POA: Diagnosis not present

## 2019-02-22 DIAGNOSIS — N186 End stage renal disease: Secondary | ICD-10-CM | POA: Diagnosis not present

## 2019-02-22 DIAGNOSIS — D509 Iron deficiency anemia, unspecified: Secondary | ICD-10-CM | POA: Diagnosis not present

## 2019-02-22 DIAGNOSIS — N2581 Secondary hyperparathyroidism of renal origin: Secondary | ICD-10-CM | POA: Diagnosis not present

## 2019-02-22 DIAGNOSIS — Z992 Dependence on renal dialysis: Secondary | ICD-10-CM | POA: Diagnosis not present

## 2019-02-22 DIAGNOSIS — E1129 Type 2 diabetes mellitus with other diabetic kidney complication: Secondary | ICD-10-CM | POA: Diagnosis not present

## 2019-02-23 ENCOUNTER — Ambulatory Visit: Payer: Medicare Other | Admitting: Physical Therapy

## 2019-02-24 DIAGNOSIS — Z992 Dependence on renal dialysis: Secondary | ICD-10-CM | POA: Diagnosis not present

## 2019-02-24 DIAGNOSIS — D509 Iron deficiency anemia, unspecified: Secondary | ICD-10-CM | POA: Diagnosis not present

## 2019-02-24 DIAGNOSIS — N186 End stage renal disease: Secondary | ICD-10-CM | POA: Diagnosis not present

## 2019-02-24 DIAGNOSIS — E1129 Type 2 diabetes mellitus with other diabetic kidney complication: Secondary | ICD-10-CM | POA: Diagnosis not present

## 2019-02-24 DIAGNOSIS — N2581 Secondary hyperparathyroidism of renal origin: Secondary | ICD-10-CM | POA: Diagnosis not present

## 2019-02-24 DIAGNOSIS — D631 Anemia in chronic kidney disease: Secondary | ICD-10-CM | POA: Diagnosis not present

## 2019-02-26 DIAGNOSIS — N186 End stage renal disease: Secondary | ICD-10-CM | POA: Diagnosis not present

## 2019-02-26 DIAGNOSIS — N2581 Secondary hyperparathyroidism of renal origin: Secondary | ICD-10-CM | POA: Diagnosis not present

## 2019-02-26 DIAGNOSIS — D509 Iron deficiency anemia, unspecified: Secondary | ICD-10-CM | POA: Diagnosis not present

## 2019-02-26 DIAGNOSIS — Z992 Dependence on renal dialysis: Secondary | ICD-10-CM | POA: Diagnosis not present

## 2019-02-26 DIAGNOSIS — D631 Anemia in chronic kidney disease: Secondary | ICD-10-CM | POA: Diagnosis not present

## 2019-02-26 DIAGNOSIS — E1129 Type 2 diabetes mellitus with other diabetic kidney complication: Secondary | ICD-10-CM | POA: Diagnosis not present

## 2019-02-28 ENCOUNTER — Ambulatory Visit (INDEPENDENT_AMBULATORY_CARE_PROVIDER_SITE_OTHER): Payer: Medicare Other | Admitting: Family Medicine

## 2019-02-28 ENCOUNTER — Other Ambulatory Visit: Payer: Self-pay

## 2019-02-28 ENCOUNTER — Encounter: Payer: Self-pay | Admitting: Family Medicine

## 2019-02-28 VITALS — BP 142/70 | HR 103 | Temp 97.3°F | Resp 16 | Ht 67.0 in | Wt 141.6 lb

## 2019-02-28 DIAGNOSIS — Z1231 Encounter for screening mammogram for malignant neoplasm of breast: Secondary | ICD-10-CM | POA: Diagnosis not present

## 2019-02-28 DIAGNOSIS — N185 Chronic kidney disease, stage 5: Secondary | ICD-10-CM | POA: Diagnosis not present

## 2019-02-28 DIAGNOSIS — Z23 Encounter for immunization: Secondary | ICD-10-CM | POA: Diagnosis not present

## 2019-02-28 DIAGNOSIS — Z1211 Encounter for screening for malignant neoplasm of colon: Secondary | ICD-10-CM

## 2019-02-28 DIAGNOSIS — I10 Essential (primary) hypertension: Secondary | ICD-10-CM | POA: Diagnosis not present

## 2019-02-28 DIAGNOSIS — D638 Anemia in other chronic diseases classified elsewhere: Secondary | ICD-10-CM | POA: Diagnosis not present

## 2019-02-28 DIAGNOSIS — E1122 Type 2 diabetes mellitus with diabetic chronic kidney disease: Secondary | ICD-10-CM | POA: Diagnosis not present

## 2019-02-28 LAB — POCT GLYCOSYLATED HEMOGLOBIN (HGB A1C): Hemoglobin A1C: 6.3 % — AB (ref 4.0–5.6)

## 2019-02-28 MED ORDER — LANTUS SOLOSTAR 100 UNIT/ML ~~LOC~~ SOPN: 10.0000 [IU] | PEN_INJECTOR | Freq: Every day | SUBCUTANEOUS | 3 refills | Status: DC

## 2019-02-28 NOTE — Patient Instructions (Addendum)
Please return in 3 months for diabetes follow up Medicare recommends an Annual Wellness Visit for all patients. Please schedule this to be done with our Nurse Educator, Loma Sousa. This is an informative "talk" visit; it's goals are to ensure that your health care needs are being met and to give you education regarding avoiding falls, ensuring you are not suffering from depression or problems with memory or thinking, and to educate you on Advance Care Planning. It helps me take good care of you!  We will call you with information regarding your referral appointment. GI for colonoscopy and mammogram.  If you do not hear from [place] within the next 2 weeks, please let me know. It can take 1-2 weeks to get appointments set up with the specialists.   Please decrease your lantus to 10 units daily.    If you have any questions or concerns, please don't hesitate to send me a message via MyChart or call the office at [phone]. Thank you for visiting with [place] today! It's our pleasure caring for you.

## 2019-02-28 NOTE — Progress Notes (Signed)
Subjective  CC:  Chief Complaint  Patient presents with  . Hypertension  . Diabetes    HPI: Brandy Houston is a 63 y.o. female who presents to the office today for follow up of diabetes and problems listed above in the chief complaint.   Diabetes follow up: Her diabetic control is reported as stable. .  She admits to running low once or twice but without symptoms.  Lowest reading was 65.  Both times was during hemodialysis.  She does check sugars are evenings will run high up to 200. She denies exertional CP or SOB or symptomatic hypoglycemia.  End-stage renal disease: Still having trouble with dialysis, variable blood pressures at times.  Feels lightheaded sometimes especially if straining with the bathroom.  He will need a fistula fixed again.  But she is hanging in there  Anemia: Patient with chronic anemia due to end-stage renal disease but had worsening anemia with hemoglobin down to 7 status post transfusion.  Had heme positive stools.  Due for colonoscopy follow-up.  Referral placed again.  She describes melanotic stools.  Health maintenance: Due for mammogram, due for colonoscopy Wt Readings from Last 3 Encounters:  02/28/19 141 lb 9.6 oz (64.2 kg)  12/10/18 138 lb 6.4 oz (62.8 kg)  07/07/18 140 lb (63.5 kg)    BP Readings from Last 3 Encounters:  02/28/19 (!) 142/70  01/05/19 (!) 167/78  12/10/18 134/62    Assessment  1. 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 Encounter for screening colonoscopy   3. Benign essential HTN   4. Stage 5 chronic kidney disease not on chronic dialysis ([place])   5. Anemia of chronic disease   6. Encounter for screening mammogram for malignant neoplasm of breast    7. Need for immunization against influenza      Plan   Diabetes is currently well controlled.  Will decrease Lantus to 10 units nightly.  Need to avoid hypoglycemia.  Looser control is recommended given her multiple comorbidities.  Flu shot today  Referred to GI for  colonoscopy given worsening anemia and guaiac positive stools.  Health maintenance: Mammogram ordered.  Hypertension: Labile blood pressures.  PRN dosing of hydralazine as needed.  She is going to see a cardiologist due to her history of atypical chest pain.  Follow up: Return in about 3 months (around 05/30/2019) for follow up Diabetes.. Orders Placed This Encounter  Procedures  . MM DIGITAL SCREENING BILATERAL  . Flu Vaccine QUAD High Dose(Fluad)  . Ambulatory referral to Gastroenterology  . POCT glycosylated hemoglobin (Hb A1C)   Meds ordered this encounter  Medications  . Insulin Glargine (LANTUS SOLOSTAR) 100 UNIT/ML Solostar Pen    Sig: Inject 10 Units into the skin daily.    Dispense:  3 pen    Refill:  3      Immunization History  Administered Date(s) Administered  . Fluad Quad(high Dose 65+) 02/28/2019  . Hepatitis B, adult 11/07/2014, 12/12/2014, 05/27/2018, 06/24/2018, 07/29/2018, 12/23/2018, 01/27/2019, 02/24/2019  . Hepatitis B, ped/adol 11/07/2014, 12/12/2014  . Influenza Inj Mdck Quad Pf 08/08/2014  . Influenza, Seasonal, Injecte, Preservative Fre 02/08/2015, 02/13/2016  . Influenza,inj,Quad PF,6+ Mos 08/08/2014, 04/01/2017  . Influenza-Unspecified 08/08/2014  . PPD Test 11/13/2015  . Pneumococcal Polysaccharide-23 08/08/2014  . Pneumococcal-Unspecified 08/08/2014  . Tdap 08/25/2016    Diabetes Related Lab Review: Lab Results  Component Value Date   HGBA1C 6.3 (A) 02/28/2019   HGBA1C 5.4 12/10/2018   HGBA1C 6.2 (H) 02/01/2018  Lab Results  Component Value Date   MICROALBUR 266.7 (H) 11/14/2014   Lab Results  Component Value Date   CREATININE 8.24 (H) 11/27/2018   BUN 86 (H) 11/27/2018   NA 136 11/27/2018   K 3.8 11/27/2018   CL 91 (L) 11/27/2018   CO2 24 11/27/2018   Lab Results  Component Value Date   CHOL 111 12/10/2018   CHOL 121 11/25/2017   CHOL 97 11/08/2015   Lab Results  Component Value Date   HDL 61.40 12/10/2018   HDL 47.70  11/25/2017   HDL 29 (L) 11/08/2015   Lab Results  Component Value Date   LDLCALC 33 12/10/2018   LDLCALC 57 11/25/2017   LDLCALC 51 11/08/2015   Lab Results  Component Value Date   TRIG 82.0 12/10/2018   TRIG 79.0 11/25/2017   TRIG 83 11/08/2015   Lab Results  Component Value Date   CHOLHDL 2 12/10/2018   CHOLHDL 3 11/25/2017   CHOLHDL 3.3 11/08/2015   No results found for: LDLDIRECT The ASCVD Risk score Mikey Bussing DC Jr., et al., 2013) failed to calculate for the following reasons:   The valid total cholesterol range is 130 to 320 mg/dL I have reviewed the PMH, Fam and Soc history. Patient Active Problem List   Diagnosis Date Noted  . Benign essential HTN     Priority: High  . Stage 5 chronic kidney disease not on chronic dialysis [facility])     Priority: High  . Above knee amputation status, left     Priority: High  . 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 Anemia of chronic disease 02/05/2017    Priority: High  . Chronic diastolic CHF (congestive heart failure) ([place]) 11/08/2015    Priority: High  . Diabetic peripheral neuropathy ([name])     Priority: High  . Compulsive tobacco user syndrome 06/24/2013    Priority: High  . Dysplasia of cervix, high grade CIN 2 01/12/2017    Priority: Medium    ASCUS + HPV CIN2-3 on colpo S/p LEEP on 7/30   . Major depression, recurrent, chronic ([name])     Priority: Medium  . Hepatitis C 08/16/2014    Priority: Medium  . On statin therapy due to risk of future cardiovascular event 12/10/2018  . Total knee replacement status   . Chronic kidney disease (CKD), stage IV (severe) ([place])   . Peripheral neuropathy 06/24/2013    Overview:  Noted to be idiopathic in PCP notes     Social History: Patient  reports that she has been smoking cigarettes. She has a 17.50 pack-year smoking history. She has never used smokeless tobacco. She reports previous alcohol use. She reports that she does not use drugs.  Review of  Systems: Ophthalmic: negative for eye pain, loss of vision or double vision Cardiovascular: negative for chest pain Respiratory: negative for SOB or persistent cough Gastrointestinal: negative for abdominal pain Genitourinary: negative for dysuria or gross hematuria MSK: negative for foot lesions Neurologic: negative for weakness or gait disturbance  Objective  Vitals: BP (!) 142/70   Pulse (!) 103   Temp (!) 97.3 F (36.3 C) (Tympanic)   Resp 16   Ht 5\' 7"  (1.702 m)   Wt 141 lb 9.6 oz (64.2 kg)   LMP 03/16/2002   SpO2 96%   BMI 22.18 kg/m  General: well appearing, no acute distress  Psych:  Alert and oriented, normal mood and affect HEENT:  Normocephalic,  atraumatic, moist mucous membranes, supple neck  Cardiovascular:  Nl S1 and S2, RRR without murmur, gallop or rub. no edema Respiratory:  Good breath sounds bilaterally, CTAB with normal effort, no rales Gastrointestinal: normal BS, soft, nontender Skin:  Warm, no rashes Neurologic:   Mental status is normal    Diabetic education: ongoing education regarding chronic disease management for diabetes was given today. We continue to reinforce the ABC's of diabetic management: A1c (<7 or 8 dependent upon patient), tight blood pressure control, and cholesterol management with goal LDL < 100 minimally. We discuss diet strategies, exercise recommendations, medication options and possible side effects. At each visit, we review recommended immunizations and preventive care recommendations for diabetics and stress that good diabetic control can prevent other problems. See below for this patient's data.    Commons side effects, risks, benefits, and alternatives for medications and treatment plan prescribed today were discussed, and the patient expressed understanding of the given instructions. Patient is instructed to call or message via MyChart if he/she has any questions or concerns regarding our treatment plan. No barriers to  understanding were identified. We discussed Red Flag symptoms and signs in detail. Patient expressed understanding regarding what to do in case of urgent or emergency type symptoms.   Medication list was reconciled, printed and provided to the patient in AVS. Patient instructions and summary information was reviewed with the patient as documented in the AVS. This note was prepared with assistance of Dragon voice recognition software. Occasional wrong-word or sound-a-like substitutions may have occurred due to the inherent limitations of voice recognition software

## 2019-03-01 DIAGNOSIS — Z992 Dependence on renal dialysis: Secondary | ICD-10-CM | POA: Diagnosis not present

## 2019-03-01 DIAGNOSIS — N2581 Secondary hyperparathyroidism of renal origin: Secondary | ICD-10-CM | POA: Diagnosis not present

## 2019-03-01 DIAGNOSIS — N186 End stage renal disease: Secondary | ICD-10-CM | POA: Diagnosis not present

## 2019-03-01 DIAGNOSIS — E1129 Type 2 diabetes mellitus with other diabetic kidney complication: Secondary | ICD-10-CM | POA: Diagnosis not present

## 2019-03-01 DIAGNOSIS — D509 Iron deficiency anemia, unspecified: Secondary | ICD-10-CM | POA: Diagnosis not present

## 2019-03-01 DIAGNOSIS — D631 Anemia in chronic kidney disease: Secondary | ICD-10-CM | POA: Diagnosis not present

## 2019-03-02 DIAGNOSIS — Z992 Dependence on renal dialysis: Secondary | ICD-10-CM | POA: Diagnosis not present

## 2019-03-02 DIAGNOSIS — I871 Compression of vein: Secondary | ICD-10-CM | POA: Diagnosis not present

## 2019-03-02 DIAGNOSIS — N186 End stage renal disease: Secondary | ICD-10-CM | POA: Diagnosis not present

## 2019-03-02 DIAGNOSIS — T82858A Stenosis of vascular prosthetic devices, implants and grafts, initial encounter: Secondary | ICD-10-CM | POA: Diagnosis not present

## 2019-03-03 DIAGNOSIS — N2581 Secondary hyperparathyroidism of renal origin: Secondary | ICD-10-CM | POA: Diagnosis not present

## 2019-03-03 DIAGNOSIS — Z992 Dependence on renal dialysis: Secondary | ICD-10-CM | POA: Diagnosis not present

## 2019-03-03 DIAGNOSIS — N186 End stage renal disease: Secondary | ICD-10-CM | POA: Diagnosis not present

## 2019-03-03 DIAGNOSIS — D509 Iron deficiency anemia, unspecified: Secondary | ICD-10-CM | POA: Diagnosis not present

## 2019-03-03 DIAGNOSIS — E1129 Type 2 diabetes mellitus with other diabetic kidney complication: Secondary | ICD-10-CM | POA: Diagnosis not present

## 2019-03-03 DIAGNOSIS — D631 Anemia in chronic kidney disease: Secondary | ICD-10-CM | POA: Diagnosis not present

## 2019-03-05 DIAGNOSIS — E1129 Type 2 diabetes mellitus with other diabetic kidney complication: Secondary | ICD-10-CM | POA: Diagnosis not present

## 2019-03-05 DIAGNOSIS — N186 End stage renal disease: Secondary | ICD-10-CM | POA: Diagnosis not present

## 2019-03-05 DIAGNOSIS — N2581 Secondary hyperparathyroidism of renal origin: Secondary | ICD-10-CM | POA: Diagnosis not present

## 2019-03-05 DIAGNOSIS — Z992 Dependence on renal dialysis: Secondary | ICD-10-CM | POA: Diagnosis not present

## 2019-03-05 DIAGNOSIS — D631 Anemia in chronic kidney disease: Secondary | ICD-10-CM | POA: Diagnosis not present

## 2019-03-05 DIAGNOSIS — D509 Iron deficiency anemia, unspecified: Secondary | ICD-10-CM | POA: Diagnosis not present

## 2019-03-08 DIAGNOSIS — N2581 Secondary hyperparathyroidism of renal origin: Secondary | ICD-10-CM | POA: Diagnosis not present

## 2019-03-08 DIAGNOSIS — E1129 Type 2 diabetes mellitus with other diabetic kidney complication: Secondary | ICD-10-CM | POA: Diagnosis not present

## 2019-03-08 DIAGNOSIS — D509 Iron deficiency anemia, unspecified: Secondary | ICD-10-CM | POA: Diagnosis not present

## 2019-03-08 DIAGNOSIS — N186 End stage renal disease: Secondary | ICD-10-CM | POA: Diagnosis not present

## 2019-03-08 DIAGNOSIS — Z992 Dependence on renal dialysis: Secondary | ICD-10-CM | POA: Diagnosis not present

## 2019-03-08 DIAGNOSIS — D631 Anemia in chronic kidney disease: Secondary | ICD-10-CM | POA: Diagnosis not present

## 2019-03-10 DIAGNOSIS — D509 Iron deficiency anemia, unspecified: Secondary | ICD-10-CM | POA: Diagnosis not present

## 2019-03-10 DIAGNOSIS — E1129 Type 2 diabetes mellitus with other diabetic kidney complication: Secondary | ICD-10-CM | POA: Diagnosis not present

## 2019-03-10 DIAGNOSIS — N2581 Secondary hyperparathyroidism of renal origin: Secondary | ICD-10-CM | POA: Diagnosis not present

## 2019-03-10 DIAGNOSIS — Z992 Dependence on renal dialysis: Secondary | ICD-10-CM | POA: Diagnosis not present

## 2019-03-10 DIAGNOSIS — D631 Anemia in chronic kidney disease: Secondary | ICD-10-CM | POA: Diagnosis not present

## 2019-03-10 DIAGNOSIS — N186 End stage renal disease: Secondary | ICD-10-CM | POA: Diagnosis not present

## 2019-03-12 DIAGNOSIS — N2581 Secondary hyperparathyroidism of renal origin: Secondary | ICD-10-CM | POA: Diagnosis not present

## 2019-03-12 DIAGNOSIS — N186 End stage renal disease: Secondary | ICD-10-CM | POA: Diagnosis not present

## 2019-03-12 DIAGNOSIS — E1129 Type 2 diabetes mellitus with other diabetic kidney complication: Secondary | ICD-10-CM | POA: Diagnosis not present

## 2019-03-12 DIAGNOSIS — D631 Anemia in chronic kidney disease: Secondary | ICD-10-CM | POA: Diagnosis not present

## 2019-03-12 DIAGNOSIS — D509 Iron deficiency anemia, unspecified: Secondary | ICD-10-CM | POA: Diagnosis not present

## 2019-03-12 DIAGNOSIS — Z992 Dependence on renal dialysis: Secondary | ICD-10-CM | POA: Diagnosis not present

## 2019-03-15 DIAGNOSIS — N2581 Secondary hyperparathyroidism of renal origin: Secondary | ICD-10-CM | POA: Diagnosis not present

## 2019-03-15 DIAGNOSIS — D631 Anemia in chronic kidney disease: Secondary | ICD-10-CM | POA: Diagnosis not present

## 2019-03-15 DIAGNOSIS — Z992 Dependence on renal dialysis: Secondary | ICD-10-CM | POA: Diagnosis not present

## 2019-03-15 DIAGNOSIS — D509 Iron deficiency anemia, unspecified: Secondary | ICD-10-CM | POA: Diagnosis not present

## 2019-03-15 DIAGNOSIS — E1129 Type 2 diabetes mellitus with other diabetic kidney complication: Secondary | ICD-10-CM | POA: Diagnosis not present

## 2019-03-15 DIAGNOSIS — N186 End stage renal disease: Secondary | ICD-10-CM | POA: Diagnosis not present

## 2019-03-17 DIAGNOSIS — Z992 Dependence on renal dialysis: Secondary | ICD-10-CM | POA: Diagnosis not present

## 2019-03-17 DIAGNOSIS — N2581 Secondary hyperparathyroidism of renal origin: Secondary | ICD-10-CM | POA: Diagnosis not present

## 2019-03-17 DIAGNOSIS — E1129 Type 2 diabetes mellitus with other diabetic kidney complication: Secondary | ICD-10-CM | POA: Diagnosis not present

## 2019-03-17 DIAGNOSIS — D631 Anemia in chronic kidney disease: Secondary | ICD-10-CM | POA: Diagnosis not present

## 2019-03-17 DIAGNOSIS — Z23 Encounter for immunization: Secondary | ICD-10-CM | POA: Diagnosis not present

## 2019-03-17 DIAGNOSIS — N186 End stage renal disease: Secondary | ICD-10-CM | POA: Diagnosis not present

## 2019-03-19 DIAGNOSIS — N2581 Secondary hyperparathyroidism of renal origin: Secondary | ICD-10-CM | POA: Diagnosis not present

## 2019-03-19 DIAGNOSIS — Z992 Dependence on renal dialysis: Secondary | ICD-10-CM | POA: Diagnosis not present

## 2019-03-19 DIAGNOSIS — E1129 Type 2 diabetes mellitus with other diabetic kidney complication: Secondary | ICD-10-CM | POA: Diagnosis not present

## 2019-03-19 DIAGNOSIS — Z23 Encounter for immunization: Secondary | ICD-10-CM | POA: Diagnosis not present

## 2019-03-19 DIAGNOSIS — N186 End stage renal disease: Secondary | ICD-10-CM | POA: Diagnosis not present

## 2019-03-19 DIAGNOSIS — D631 Anemia in chronic kidney disease: Secondary | ICD-10-CM | POA: Diagnosis not present

## 2019-03-22 DIAGNOSIS — N186 End stage renal disease: Secondary | ICD-10-CM | POA: Diagnosis not present

## 2019-03-22 DIAGNOSIS — N2581 Secondary hyperparathyroidism of renal origin: Secondary | ICD-10-CM | POA: Diagnosis not present

## 2019-03-22 DIAGNOSIS — E1129 Type 2 diabetes mellitus with other diabetic kidney complication: Secondary | ICD-10-CM | POA: Diagnosis not present

## 2019-03-22 DIAGNOSIS — Z23 Encounter for immunization: Secondary | ICD-10-CM | POA: Diagnosis not present

## 2019-03-22 DIAGNOSIS — Z992 Dependence on renal dialysis: Secondary | ICD-10-CM | POA: Diagnosis not present

## 2019-03-22 DIAGNOSIS — D631 Anemia in chronic kidney disease: Secondary | ICD-10-CM | POA: Diagnosis not present

## 2019-03-24 DIAGNOSIS — Z23 Encounter for immunization: Secondary | ICD-10-CM | POA: Diagnosis not present

## 2019-03-24 DIAGNOSIS — Z992 Dependence on renal dialysis: Secondary | ICD-10-CM | POA: Diagnosis not present

## 2019-03-24 DIAGNOSIS — E1129 Type 2 diabetes mellitus with other diabetic kidney complication: Secondary | ICD-10-CM | POA: Diagnosis not present

## 2019-03-24 DIAGNOSIS — D631 Anemia in chronic kidney disease: Secondary | ICD-10-CM | POA: Diagnosis not present

## 2019-03-24 DIAGNOSIS — N186 End stage renal disease: Secondary | ICD-10-CM | POA: Diagnosis not present

## 2019-03-24 DIAGNOSIS — N2581 Secondary hyperparathyroidism of renal origin: Secondary | ICD-10-CM | POA: Diagnosis not present

## 2019-03-29 DIAGNOSIS — N2581 Secondary hyperparathyroidism of renal origin: Secondary | ICD-10-CM | POA: Diagnosis not present

## 2019-03-29 DIAGNOSIS — Z992 Dependence on renal dialysis: Secondary | ICD-10-CM | POA: Diagnosis not present

## 2019-03-29 DIAGNOSIS — E1129 Type 2 diabetes mellitus with other diabetic kidney complication: Secondary | ICD-10-CM | POA: Diagnosis not present

## 2019-03-29 DIAGNOSIS — Z23 Encounter for immunization: Secondary | ICD-10-CM | POA: Diagnosis not present

## 2019-03-29 DIAGNOSIS — N186 End stage renal disease: Secondary | ICD-10-CM | POA: Diagnosis not present

## 2019-03-29 DIAGNOSIS — D631 Anemia in chronic kidney disease: Secondary | ICD-10-CM | POA: Diagnosis not present

## 2019-03-31 DIAGNOSIS — N186 End stage renal disease: Secondary | ICD-10-CM | POA: Diagnosis not present

## 2019-03-31 DIAGNOSIS — N2581 Secondary hyperparathyroidism of renal origin: Secondary | ICD-10-CM | POA: Diagnosis not present

## 2019-03-31 DIAGNOSIS — E1129 Type 2 diabetes mellitus with other diabetic kidney complication: Secondary | ICD-10-CM | POA: Diagnosis not present

## 2019-03-31 DIAGNOSIS — D631 Anemia in chronic kidney disease: Secondary | ICD-10-CM | POA: Diagnosis not present

## 2019-03-31 DIAGNOSIS — Z23 Encounter for immunization: Secondary | ICD-10-CM | POA: Diagnosis not present

## 2019-03-31 DIAGNOSIS — Z992 Dependence on renal dialysis: Secondary | ICD-10-CM | POA: Diagnosis not present

## 2019-04-01 ENCOUNTER — Telehealth: Payer: Self-pay

## 2019-04-01 NOTE — Telephone Encounter (Signed)
[facility], [facility] are faxing CMN to be completed and returned

## 2019-04-01 NOTE — Telephone Encounter (Signed)
Ms. Dieguez aware immunization hx faxed to [facility]

## 2019-04-01 NOTE — Telephone Encounter (Signed)
Copied from [place] [id number]. Topic: General - Other >> Apr 01, 2019  2:17 PM Leward Quan A wrote: Reason for CRM: Carletha with [facility] called to ask for CMN form to be completed and returned ASAP please. Ph# [phone]

## 2019-04-01 NOTE — Telephone Encounter (Signed)
Copied from [place] [id number]. Topic: General - Inquiry >> Apr 01, 2019  1:58 PM Richardo Priest, [place] wrote: Reason for CRM: Patient called in and stated she would like a fax sent [facility] so they can have a copy of her immunization record. Fax to [phone]. Please advise.

## 2019-04-02 DIAGNOSIS — E1129 Type 2 diabetes mellitus with other diabetic kidney complication: Secondary | ICD-10-CM | POA: Diagnosis not present

## 2019-04-02 DIAGNOSIS — N186 End stage renal disease: Secondary | ICD-10-CM | POA: Diagnosis not present

## 2019-04-02 DIAGNOSIS — Z992 Dependence on renal dialysis: Secondary | ICD-10-CM | POA: Diagnosis not present

## 2019-04-02 DIAGNOSIS — Z23 Encounter for immunization: Secondary | ICD-10-CM | POA: Diagnosis not present

## 2019-04-02 DIAGNOSIS — D631 Anemia in chronic kidney disease: Secondary | ICD-10-CM | POA: Diagnosis not present

## 2019-04-02 DIAGNOSIS — N2581 Secondary hyperparathyroidism of renal origin: Secondary | ICD-10-CM | POA: Diagnosis not present

## 2019-04-03 NOTE — Progress Notes (Signed)
Cardiology Office Note:    Date:  04/04/2019   ID:  Brandy Houston, DOB October 08, 1955, MRN [id number]  PCP:  [name], MD  Cardiologist:  No primary care provider on file.  Electrophysiologist:  None   Referring MD: Mauricia Area, MD   Chief Complaint  Patient presents with  . New Patient (Initial Visit)  . Chest Pain   History of Present Illness:    Brandy Houston is a 63 y.o. female with a hx of ESRD on HD, type 2 diabetes, moderate aortic regurgitation, hypertension, anemia, septic arthritis s/p L AKA who is referred by Dr. Jimmy Footman for an evaluation of chest pain.  She presented to the ED on 11/27/2018 with chest pain.  Work-up was unremarkable but was notable for low hemoglobin (7.1).  States that since that time she has continued to have intermittent left-sided chest pain.  Occurs about 2 times per week.  Typically occurs after HD.  Describes as cramping pain, 5-6 out of 10 in intensity, worse with inspiration.  Last 5 to 10 minutes.  Not related to exertion.  However, states that she also has a second type of chest pain that she gets when she overexerts herself.  Describes as substernal chest tightness, resolves with rest.  States that she does not get this pain often, only when she overexerts herself in physical therapy.  Last TEE in 07/2014 for bacteremia showed no vegetation, normal LVEF, moderate AI.  Past Medical History:  Diagnosis Date  . Anemia   . Anxiety   . Arthritis    "in my joints" (03/10/2017)  . Chronic diastolic CHF (congestive heart failure) ([place])   . CKD (chronic kidney disease), stage IV ([place])    stage IV. previous HD, none currently 10/30/15 (confirmed 02/05/2017 & 03/10/2017)  . Depression    Chronic  . Diabetic peripheral neuropathy ([place])   . Dysrhythmia    tachycardia, normal ECHO 08-09-14  . GERD (gastroesophageal reflux disease)   . Heart murmur     dx'd 02/05/2017  . Hepatitis C    "tx'd in 2016; I'm negative now" (02/05/2017)  . History of blood  transfusion 2017   "w/knee replacement"  . Hypertension   . Osteoarthritis of left knee   . Peripheral neuropathy   . Protein calorie malnutrition ([place])   . Septic arthritis ([place])   . Slow transit constipation   . Type II diabetes mellitus ([name])    IDDM  . Uncontrolled hypertension 02/18/2013  . Unsteady gait     Past Surgical History:  Procedure Laterality Date  . A/V FISTULAGRAM Right 07/07/2018   Procedure: A/V FISTULAGRAM;  Surgeon: Marty Heck, MD;  Location: [place] [facility];  Service: Cardiovascular;  Laterality: Right;  . AMPUTATION Left 03/20/2017   Procedure: LEFT ABOVE KNEE AMPUTATION;  Surgeon: Newt Minion, MD;  Location: [place];  Service: Orthopedics;  Laterality: Left;  . APPENDECTOMY    . AV FISTULA PLACEMENT Left 09/13/2014   Procedure: Brachial Artery to Brachial Vein Gortex Four - Seven Stretch GRAFT INSERTION Left Forearm;  Surgeon: Mal Misty, MD;  Location: [place];  Service: Vascular;  Laterality: Left;  . BASCILIC VEIN TRANSPOSITION Right 10/26/2017   Procedure: BASILIC VEIN TRANSPOSITION FIRST STAGE RIGHT ARM;  Surgeon: Conrad Secaucus, MD;  Location: [facility];  Service: Vascular;  Laterality: Right;  . BASCILIC VEIN TRANSPOSITION Right 02/01/2018   Procedure: SECOND STAGE BASILIC VEIN TRANSPOSITION RIGHT UPPER EXTREMITY;  Surgeon: Marty Heck, MD;  [facility];  Service: Vascular;  Laterality: Right;  . CHOLECYSTECTOMY OPEN    . COLON SURGERY    . EXCISIONAL TOTAL KNEE ARTHROPLASTY WITH ANTIBIOTIC SPACERS Left 02/06/2017   Procedure: Incisional total iknee with antibiotic spacer ;  Surgeon: Leandrew Koyanagi, MD;  Location: [place];  Service: Orthopedics;  Laterality: Left;  . IR FLUORO GUIDE CV LINE RIGHT  02/10/2017  . IR REMOVAL TUN CV CATH W/O FL  04/01/2017  . IR US GUIDE VASC ACCESS RIGHT  02/10/2017  . IRRIGATION AND DEBRIDEMENT KNEE Left 03/12/2017   Procedure: IRRIGATION AND DEBRIDEMENT LEFT KNEE WITH WOUND VAC APPLICATION;  Surgeon: Leandrew Koyanagi, MD;  Location: [place];  Service: Orthopedics;  Laterality: Left;  . JOINT REPLACEMENT    . KNEE ARTHROSCOPY Right 08/10/2014   Procedure: ARTHROSCOPY I & D KNEE;  Surgeon: Marianna Payment, MD;  Location: [facility];  Service: Orthopedics;  Laterality: Right;  . KNEE ARTHROSCOPY Left 08/11/2014   Procedure: ARTHROSCOPIC WASHOUT LEFT KNEE;  Surgeon: Marianna Payment, MD;  Location: [place];  Service: Orthopedics;  Laterality: Left;  . KNEE ARTHROSCOPY Left 08/19/2014   Procedure: ARTHROSCOPIC WASHOUT LEFT KNEE;  Surgeon: Leandrew Koyanagi, MD;  Location: [place];  Service: Orthopedics;  Laterality: Left;  . KNEE ARTHROSCOPY WITH LATERAL MENISECTOMY Left 04/04/2015   Procedure: AND PARTIAL LATERAL MENISECTOMY;  Surgeon: Leandrew Koyanagi, MD;  Location: [place];  Service: Orthopedics;  Laterality: Left;  . KNEE ARTHROSCOPY WITH MEDIAL MENISECTOMY Left 04/04/2015   Procedure: LEFT KNEE ARTHROSCOPY WITH PARTIAL MEDIAL MENISCECTOMY  AND SYNOVECTOMY;  Surgeon: Leandrew Koyanagi, MD;  Location: [place];  Service: Orthopedics;  Laterality: Left;  . PERIPHERAL VASCULAR BALLOON ANGIOPLASTY  07/07/2018   Procedure: PERIPHERAL VASCULAR BALLOON ANGIOPLASTY;  Surgeon: Marty Heck, MD;  Location: [place] [facility];  Service: Cardiovascular;;  right AV fistula  . SHOULDER ARTHROSCOPY Bilateral 08/10/2014   Procedure: I & D BILATERAL SHOULDERS ;  Surgeon: Marianna Payment, MD;  [facility];  Service: Orthopedics;  Laterality: Bilateral;  . SMALL INTESTINE SURGERY     Due to Small Bowel Obstruction; "fixed it when they did my gallbladder OR"  . TEE WITHOUT CARDIOVERSION N/A 08/14/2014   Procedure: TRANSESOPHAGEAL ECHOCARDIOGRAM (TEE);  Surgeon: Thayer Headings, MD;  Location: [place];  Service: Cardiovascular;  Laterality: N/A;  . TENOSYNOVECTOMY Right 08/11/2014   Procedure: RIGHT WRIST IRRIGATION AND DEBRIDEMENT, TENOSYNOVECTOMY;  Surgeon: Marianna Payment, MD;   Location: [place];  Service: Orthopedics;  Laterality: Right;  . TOTAL KNEE ARTHROPLASTY Left 11/07/2015   Procedure: LEFT TOTAL KNEE ARTHROPLASTY WITH REVISION OF IMPLANTS;  Surgeon: Leandrew Koyanagi, MD;  Location: [facility];  Service: Orthopedics;  Laterality: Left;  . TUBAL LIGATION      Current Medications: Current Meds  Medication Sig  . Accu-Chek Softclix Lancets lancets Check sugars three times a day for E11.65  . amLODipine (NORVASC) 10 MG tablet Take 1 tablet (10 mg total) by mouth at bedtime.  [name] aspirin EC 81 MG tablet Take 81 mg by mouth daily.  [name] atorvastatin (LIPITOR) 10 MG tablet TAKE 1 TABLET BY MOUTH AT BEDTIME  . Blood Glucose Monitoring Suppl (ACCU-CHEK AVIVA PLUS) W/DEVICE KIT Check sugars TID for E11.65  . calcium acetate (PHOSLO) 667 MG capsule Take 2 capsules by mouth 3 (three) times daily.  . Chlorpheniramine Maleate (ALLERGY PO) Take 1 tablet by mouth daily as needed (allergies).  . cinacalcet (  SENSIPAR) 30 MG tablet Take by mouth.  . cloNIDine (CATAPRES) 0.2 MG tablet Take 0.2 mg by mouth 2 (two) times daily.  [name] gabapentin (NEURONTIN) 100 MG capsule Take 200 mg by mouth 3 (three) times daily.  [name] glucose blood (ACCU-CHEK AVIVA) test strip Check sugars TID for E11.65  . hydrALAZINE (APRESOLINE) 100 MG tablet Take 100 mg by mouth as directed. Take 1 tablet TID ONLY ON (SUN MON, WED, FRI)  . Insulin Glargine (LANTUS SOLOSTAR) 100 UNIT/ML Solostar Pen Inject 10 Units into the skin daily.  [name] Devices (ACCU-CHEK SOFTCLIX) lancets Check sugars TID for E11.65  . lidocaine-prilocaine (EMLA) cream Apply 1 application topically once.   [name] omeprazole (PRILOSEC) 20 MG capsule Take 20 mg by mouth daily before breakfast.   . polyethylene glycol (MIRALAX / GLYCOLAX) 17 g packet Take 17 g by mouth daily as needed for moderate constipation.  . sertraline (ZOLOFT) 100 MG tablet Take 1 tablet (100 mg total) by mouth daily.  . sevelamer carbonate (RENVELA) 800 MG tablet Take 1,600 mg by  mouth 3 (three) times daily with meals.  . traZODone (DESYREL) 50 MG tablet Take 50 mg by mouth at bedtime.  [name] VITAMIN D PO Take by mouth.     Allergies:   Compazine [prochlorperazine], Shellfish-derived products, Iodinated diagnostic agents, Omnipaque [iohexol], Tape, and Sulfa antibiotics   Social History   Socioeconomic History  . Marital status: Single    Spouse name: Not on file  . Number of children: Not on file  . Years of education: Not on file  . Highest education level: Not on file  Occupational History  . Not on file  Social Needs  . Financial resource strain: Not on file  . Food insecurity    Worry: Not on file    Inability: Not on file  . Transportation needs    Medical: Not on file    Non-medical: Not on file  Tobacco Use  . Smoking status: Current Every Day Smoker    Packs/day: 0.50    Years: 35.00    Pack years: 17.50    Types: Cigarettes  . Smokeless tobacco: Never Used  Substance and Sexual Activity  . Alcohol use: Not Currently    Alcohol/week: 0.0 standard drinks    Comment: 03/10/2017 "I drink a wine cooler a few times/year"  . Drug use: No  . Sexual activity: Never  Lifestyle  . Physical activity    Days per week: Not on file    Minutes per session: Not on file  . Stress: Not on file  Relationships  . Social Herbalist on phone: Not on file    Gets together: Not on file    Attends religious service: Not on file    Active member of club or organization: Not on file    Attends meetings of clubs or organizations: Not on file    Relationship status: Not on file  Other Topics Concern  . Not on file  Social History Narrative  . Not on file     Family History: [name] family history includes Cancer in her brother, mother, and sister; Diabetes in her brother; Heart disease in her father; Hypertension in her brother and sister.  ROS:   Please see the history of present illness.    All other systems reviewed and are negative.   EKGs/Labs/Other Studies Reviewed:    The following studies were reviewed today:   EKG:  EKG is  ordered today.  The ekg ordered today demonstrates normal sinus rhythm, rate 89, poor R wave progression, nonspecific T wave flattening  Recent Labs: 11/27/2018: B Natriuretic Peptide 638.8; BUN 86; Creatinine, Ser 8.24; Magnesium 2.6; Potassium 3.8; Sodium 136 12/10/2018: ALT 12; Hemoglobin 8.8 Repeated and verified X2.; Platelets 214.0  Recent Lipid Panel    Component Value Date/Time   CHOL 111 12/10/2018 1133   TRIG 82.0 12/10/2018 1133   HDL 61.40 12/10/2018 1133   CHOLHDL 2 12/10/2018 1133   VLDL 16.4 12/10/2018 1133   LDLCALC 33 12/10/2018 1133    Physical Exam:    VS:  BP 134/68 (BP Location: Left Arm, Patient Position: Sitting, Cuff Size: Normal)   Pulse 89   Temp (!) 97.5 F (36.4 C)   Ht _0  (1.702 m)   Wt 144 lb (65.3 kg)   LMP 03/16/2002   BMI 22.55 kg/m     Wt Readings from Last 3 Encounters:  04/04/19 144 lb (65.3 kg)  02/28/19 141 lb 9.6 oz (64.2 kg)  12/10/18 138 lb 6.4 oz (62.8 kg)     GEN: in no acute distress HEENT: Normal NECK: No JVD; No carotid bruits LYMPHATICS: No lymphadenopathy CARDIAC: RRR, 3/6 systolic murmur RESPIRATORY:  Clear to auscultation without rales, wheezing or rhonchi  ABDOMEN: Soft, non-tender, non-distended MUSCULOSKELETAL:  No edema. S/p L AKA SKIN: Warm and dry NEUROLOGIC:  Alert and oriented x 3 PSYCHIATRIC:  Normal affect   ASSESSMENT:    1. Precordial pain   2. Aortic valve insufficiency, etiology of cardiac valve disease unspecified   3. Essential hypertension   4. Hyperlipidemia, unspecified hyperlipidemia type    PLAN:    In order of problems listed above:  Chest pain: Description consistent with 2 types of chest pain.  First type is cramping left-sided pain that occurs after dialysis at rest.  Suspect noncardiac chest pain, likely muscle cramps related to HD.  Second type of chest pain is concerning for typical  angina.  Describes substernal chest tightness that occurs with exertion and resolves with rest. -Stress MPI to evaluate for ischemia  Aortic regurgitation: Moderate on TEE in 07/2014.  Will check TTE   Hypertension: Appears controlled.  On amlodipine 10 mg daily, hydralazine 100 mg 3 times daily on non-HD days, clonidine 0.2 mg twice daily   Type 2 diabetes: On insulin.  Last A1c 6.3 on 02/28/2019  HLD: On atorvastatin 10 mg daily.  Last LDL 57 on 11/25/2017  ESRD: On HD  RTC in 3 months  Medication Adjustments/Labs and Tests Ordered: Current medicines are reviewed at length with the patient today.  Concerns regarding medicines are outlined above.  Orders Placed This Encounter  Procedures  . Myocardial Perfusion Imaging  . EKG 12-Lead  . ECHOCARDIOGRAM COMPLETE   No orders of the defined types were placed in this encounter.   Patient Instructions  Medication Instructions:  No changes   Lab Work: None ordered   Testing/Procedures:  Schedule Echo  Schedule Lexiscan Myoview  Follow-Up: At [facility], you and your health needs are our priority.  As part of our continuing mission to provide you with exceptional heart care, we have created designated Provider Care Teams.  These Care Teams include your primary Cardiologist (physician) and Advanced Practice Providers (APPs -  Physician Assistants and Nurse Practitioners) who all work together to provide you with the care you need, when you need it.  Your next appointment:  3 months       Signed, Donato Heinz, MD  04/04/2019 10:13 AM    [facility]

## 2019-04-04 ENCOUNTER — Ambulatory Visit (INDEPENDENT_AMBULATORY_CARE_PROVIDER_SITE_OTHER): Payer: Medicare Other | Admitting: Cardiology

## 2019-04-04 ENCOUNTER — Other Ambulatory Visit: Payer: Self-pay

## 2019-04-04 ENCOUNTER — Encounter: Payer: Self-pay | Admitting: Cardiology

## 2019-04-04 VITALS — BP 134/68 | HR 89 | Temp 97.5°F | Ht 67.0 in | Wt 144.0 lb

## 2019-04-04 DIAGNOSIS — R072 Precordial pain: Secondary | ICD-10-CM

## 2019-04-04 DIAGNOSIS — I10 Essential (primary) hypertension: Secondary | ICD-10-CM

## 2019-04-04 DIAGNOSIS — E785 Hyperlipidemia, unspecified: Secondary | ICD-10-CM | POA: Diagnosis not present

## 2019-04-04 DIAGNOSIS — I351 Nonrheumatic aortic (valve) insufficiency: Secondary | ICD-10-CM

## 2019-04-04 NOTE — Patient Instructions (Signed)
Medication Instructions:  No changes   Lab Work: None ordered   Testing/Procedures:  Schedule Echo  Schedule Lexiscan Myoview  Follow-Up: At [facility], you and your health needs are our priority.  As part of our continuing mission to provide you with exceptional heart care, we have created designated Provider Care Teams.  These Care Teams include your primary Cardiologist (physician) and Advanced Practice Providers (APPs -  Physician Assistants and Nurse Practitioners) who all work together to provide you with the care you need, when you need it.  Your next appointment:  3 months

## 2019-04-05 ENCOUNTER — Telehealth: Payer: Self-pay | Admitting: Gastroenterology

## 2019-04-05 DIAGNOSIS — N2581 Secondary hyperparathyroidism of renal origin: Secondary | ICD-10-CM | POA: Diagnosis not present

## 2019-04-05 DIAGNOSIS — E1129 Type 2 diabetes mellitus with other diabetic kidney complication: Secondary | ICD-10-CM | POA: Diagnosis not present

## 2019-04-05 DIAGNOSIS — Z992 Dependence on renal dialysis: Secondary | ICD-10-CM | POA: Diagnosis not present

## 2019-04-05 DIAGNOSIS — Z23 Encounter for immunization: Secondary | ICD-10-CM | POA: Diagnosis not present

## 2019-04-05 DIAGNOSIS — N186 End stage renal disease: Secondary | ICD-10-CM | POA: Diagnosis not present

## 2019-04-05 DIAGNOSIS — D631 Anemia in chronic kidney disease: Secondary | ICD-10-CM | POA: Diagnosis not present

## 2019-04-05 NOTE — Telephone Encounter (Signed)
DOD 02/28/19  Dr. Silverio Decamp, there is a referral for patient to schedule a colonoscopy.  Previous colonoscopy report from [facility] from 2015 will be sent to you for review.  Please advise.

## 2019-04-07 DIAGNOSIS — N2581 Secondary hyperparathyroidism of renal origin: Secondary | ICD-10-CM | POA: Diagnosis not present

## 2019-04-07 DIAGNOSIS — N186 End stage renal disease: Secondary | ICD-10-CM | POA: Diagnosis not present

## 2019-04-07 DIAGNOSIS — Z23 Encounter for immunization: Secondary | ICD-10-CM | POA: Diagnosis not present

## 2019-04-07 DIAGNOSIS — E1129 Type 2 diabetes mellitus with other diabetic kidney complication: Secondary | ICD-10-CM | POA: Diagnosis not present

## 2019-04-07 DIAGNOSIS — Z992 Dependence on renal dialysis: Secondary | ICD-10-CM | POA: Diagnosis not present

## 2019-04-07 DIAGNOSIS — D631 Anemia in chronic kidney disease: Secondary | ICD-10-CM | POA: Diagnosis not present

## 2019-04-09 DIAGNOSIS — Z992 Dependence on renal dialysis: Secondary | ICD-10-CM | POA: Diagnosis not present

## 2019-04-09 DIAGNOSIS — N186 End stage renal disease: Secondary | ICD-10-CM | POA: Diagnosis not present

## 2019-04-09 DIAGNOSIS — Z23 Encounter for immunization: Secondary | ICD-10-CM | POA: Diagnosis not present

## 2019-04-09 DIAGNOSIS — N2581 Secondary hyperparathyroidism of renal origin: Secondary | ICD-10-CM | POA: Diagnosis not present

## 2019-04-09 DIAGNOSIS — E1129 Type 2 diabetes mellitus with other diabetic kidney complication: Secondary | ICD-10-CM | POA: Diagnosis not present

## 2019-04-09 DIAGNOSIS — D631 Anemia in chronic kidney disease: Secondary | ICD-10-CM | POA: Diagnosis not present

## 2019-04-12 DIAGNOSIS — D631 Anemia in chronic kidney disease: Secondary | ICD-10-CM | POA: Diagnosis not present

## 2019-04-12 DIAGNOSIS — Z23 Encounter for immunization: Secondary | ICD-10-CM | POA: Diagnosis not present

## 2019-04-12 DIAGNOSIS — N2581 Secondary hyperparathyroidism of renal origin: Secondary | ICD-10-CM | POA: Diagnosis not present

## 2019-04-12 DIAGNOSIS — E1129 Type 2 diabetes mellitus with other diabetic kidney complication: Secondary | ICD-10-CM | POA: Diagnosis not present

## 2019-04-12 DIAGNOSIS — N186 End stage renal disease: Secondary | ICD-10-CM | POA: Diagnosis not present

## 2019-04-12 DIAGNOSIS — Z992 Dependence on renal dialysis: Secondary | ICD-10-CM | POA: Diagnosis not present

## 2019-04-12 NOTE — Telephone Encounter (Signed)
See note

## 2019-04-12 NOTE — Telephone Encounter (Signed)
[facility] is [facility]ing [facility] for 10/16 which they had not gotten back

## 2019-04-13 ENCOUNTER — Ambulatory Visit (HOSPITAL_COMMUNITY): Payer: Medicare Other | Attending: Cardiovascular Disease

## 2019-04-13 ENCOUNTER — Other Ambulatory Visit: Payer: Self-pay

## 2019-04-13 DIAGNOSIS — R072 Precordial pain: Secondary | ICD-10-CM | POA: Diagnosis not present

## 2019-04-13 NOTE — Telephone Encounter (Signed)
CMN was refaxed 10/26

## 2019-04-14 ENCOUNTER — Telehealth (HOSPITAL_COMMUNITY): Payer: Self-pay | Admitting: *Deleted

## 2019-04-14 DIAGNOSIS — Z23 Encounter for immunization: Secondary | ICD-10-CM | POA: Diagnosis not present

## 2019-04-14 DIAGNOSIS — N2581 Secondary hyperparathyroidism of renal origin: Secondary | ICD-10-CM | POA: Diagnosis not present

## 2019-04-14 DIAGNOSIS — Z992 Dependence on renal dialysis: Secondary | ICD-10-CM | POA: Diagnosis not present

## 2019-04-14 DIAGNOSIS — D631 Anemia in chronic kidney disease: Secondary | ICD-10-CM | POA: Diagnosis not present

## 2019-04-14 DIAGNOSIS — E1129 Type 2 diabetes mellitus with other diabetic kidney complication: Secondary | ICD-10-CM | POA: Diagnosis not present

## 2019-04-14 DIAGNOSIS — N186 End stage renal disease: Secondary | ICD-10-CM | POA: Diagnosis not present

## 2019-04-14 NOTE — Telephone Encounter (Signed)
Close encounter 

## 2019-04-15 ENCOUNTER — Other Ambulatory Visit: Payer: Self-pay

## 2019-04-15 ENCOUNTER — Ambulatory Visit (HOSPITAL_COMMUNITY)
Admission: RE | Admit: 2019-04-15 | Discharge: 2019-04-15 | Disposition: A | Discharge status: Home / Self Care | Payer: Medicare Other | Source: Ambulatory Visit | Attending: Cardiovascular Disease | Admitting: Cardiovascular Disease

## 2019-04-15 DIAGNOSIS — R072 Precordial pain: Secondary | ICD-10-CM | POA: Diagnosis not present

## 2019-04-15 LAB — MYOCARDIAL PERFUSION IMAGING
LV dias vol: 126 mL (ref 46–106)
LV sys vol: 62 mL
Peak HR: 104 {beats}/min
Rest HR: 81 {beats}/min
SDS: 1
SRS: 2
SSS: 3
TID: 0.93

## 2019-04-15 MED ORDER — AMINOPHYLLINE 25 MG/ML IV SOLN
75.0000 mg | Freq: Once | INTRAVENOUS | Status: AC
  Administered 2019-04-15: 75 mg via INTRAVENOUS

## 2019-04-15 MED ORDER — TECHNETIUM TC 99M TETROFOSMIN IV KIT
9.5000 | PACK | Freq: Once | INTRAVENOUS | Status: AC | PRN
  Administered 2019-04-15: 9.5 via INTRAVENOUS
  Filled 2019-04-15: qty 10

## 2019-04-15 MED ORDER — REGADENOSON 0.4 MG/5ML IV SOLN
0.4000 mg | Freq: Once | INTRAVENOUS | Status: AC
  Administered 2019-04-15: 0.4 mg via INTRAVENOUS

## 2019-04-15 MED ORDER — TECHNETIUM TC 99M TETROFOSMIN IV KIT
28.8000 | PACK | Freq: Once | INTRAVENOUS | Status: AC | PRN
  Administered 2019-04-15: 28.8 via INTRAVENOUS
  Filled 2019-04-15: qty 29

## 2019-04-16 DIAGNOSIS — Z992 Dependence on renal dialysis: Secondary | ICD-10-CM | POA: Diagnosis not present

## 2019-04-16 DIAGNOSIS — N186 End stage renal disease: Secondary | ICD-10-CM | POA: Diagnosis not present

## 2019-04-16 DIAGNOSIS — N2581 Secondary hyperparathyroidism of renal origin: Secondary | ICD-10-CM | POA: Diagnosis not present

## 2019-04-16 DIAGNOSIS — Z23 Encounter for immunization: Secondary | ICD-10-CM | POA: Diagnosis not present

## 2019-04-16 DIAGNOSIS — D631 Anemia in chronic kidney disease: Secondary | ICD-10-CM | POA: Diagnosis not present

## 2019-04-16 DIAGNOSIS — E1129 Type 2 diabetes mellitus with other diabetic kidney complication: Secondary | ICD-10-CM | POA: Diagnosis not present

## 2019-04-18 ENCOUNTER — Other Ambulatory Visit: Payer: Self-pay

## 2019-04-18 DIAGNOSIS — I351 Nonrheumatic aortic (valve) insufficiency: Secondary | ICD-10-CM

## 2019-04-19 ENCOUNTER — Telehealth: Payer: Self-pay | Admitting: Family Medicine

## 2019-04-19 DIAGNOSIS — Z992 Dependence on renal dialysis: Secondary | ICD-10-CM | POA: Diagnosis not present

## 2019-04-19 DIAGNOSIS — D631 Anemia in chronic kidney disease: Secondary | ICD-10-CM | POA: Diagnosis not present

## 2019-04-19 DIAGNOSIS — N2581 Secondary hyperparathyroidism of renal origin: Secondary | ICD-10-CM | POA: Diagnosis not present

## 2019-04-19 DIAGNOSIS — N186 End stage renal disease: Secondary | ICD-10-CM | POA: Diagnosis not present

## 2019-04-19 DIAGNOSIS — E1129 Type 2 diabetes mellitus with other diabetic kidney complication: Secondary | ICD-10-CM | POA: Diagnosis not present

## 2019-04-19 NOTE — Telephone Encounter (Signed)
I left a message asking the patient to call and schedule Medicare AWV with Courtney ([facility]).  If patient calls back, please schedule Medicare Wellness Visit at next available opening.  VDM (Dee-Dee) °

## 2019-04-21 DIAGNOSIS — D631 Anemia in chronic kidney disease: Secondary | ICD-10-CM | POA: Diagnosis not present

## 2019-04-21 DIAGNOSIS — N2581 Secondary hyperparathyroidism of renal origin: Secondary | ICD-10-CM | POA: Diagnosis not present

## 2019-04-21 DIAGNOSIS — N186 End stage renal disease: Secondary | ICD-10-CM | POA: Diagnosis not present

## 2019-04-21 DIAGNOSIS — Z992 Dependence on renal dialysis: Secondary | ICD-10-CM | POA: Diagnosis not present

## 2019-04-21 DIAGNOSIS — E1129 Type 2 diabetes mellitus with other diabetic kidney complication: Secondary | ICD-10-CM | POA: Diagnosis not present

## 2019-04-22 ENCOUNTER — Telehealth: Payer: Self-pay

## 2019-04-22 ENCOUNTER — Other Ambulatory Visit: Payer: Self-pay

## 2019-04-22 ENCOUNTER — Ambulatory Visit (INDEPENDENT_AMBULATORY_CARE_PROVIDER_SITE_OTHER): Payer: Medicare Other

## 2019-04-22 DIAGNOSIS — Z1211 Encounter for screening for malignant neoplasm of colon: Secondary | ICD-10-CM

## 2019-04-22 DIAGNOSIS — Z0000 Encounter for general adult medical examination without abnormal findings: Secondary | ICD-10-CM

## 2019-04-22 NOTE — Telephone Encounter (Signed)
Copied from [place] [id number]. Topic: General - Inquiry >> Apr 22, 2019  3:49 PM Richardo Priest, NT wrote: Reason for CRM: Lovena Le called in in regards to a medical necessities form and physicians order that is incomplete. Please advise and call back at [phone].

## 2019-04-22 NOTE — Patient Instructions (Signed)
Brandy Houston , Thank you for taking time to come for your Medicare Wellness Visit. I appreciate your ongoing commitment to your health goals. Please review the following plan we discussed and let me know if I can assist you in the future.   Screening recommendations/referrals: Colorectal Screening: ordered today; last colonoscopy 07/01/13 with Dr. Newman Pies  Mammogram: ordered at last visit [phone] for scheduling  Bone Density: starting at age 64  Vision and Dental Exams: Recommended annual ophthalmology exams for early detection of glaucoma and other disorders of the eye Recommended annual dental exams for proper oral hygiene  Diabetic Exams: Diabetic Eye Exam: recommended yearly; up to date Diabetic Foot Exam: recommended yearly; up to date   Vaccinations: Influenza vaccine: completed 02/28/19 Pneumococcal vaccine: up to date; last 08/08/14 Tdap vaccine: up to date; last 08/25/16 Shingles vaccine: Please call your insurance company to determine your out of pocket expense for the Shingrix vaccine. You may receive this vaccine at your local pharmacy.  Advanced directives: Advance directives discussed with you today. I have provided a copy for you to complete at home and have notarized. Once this is complete please bring a copy in to our office so we can scan it into your chart.  Goals: Follow diet advised by nephrologist.  Continue to be careful of falls   Next appointment: Please schedule your Annual Wellness Visit with your Nurse Health Advisor in one year.  Preventive Care 31 Years and Older, Female Preventive care refers to lifestyle choices and visits with your health care provider that can promote health and wellness. What does preventive care include?  A yearly physical exam. This is also called an annual well check.  Dental exams once or twice a year.  Routine eye exams. Ask your health care provider how often you should have your eyes checked.  Personal lifestyle choices,  including:  Daily care of your teeth and gums.  Regular physical activity.  Eating a healthy diet.  Avoiding tobacco and drug use.  Limiting alcohol use.  Practicing safe sex.  Taking low-dose aspirin every day if recommended by your health care provider.  Taking vitamin and mineral supplements as recommended by your health care provider. What happens during an annual well check? The services and screenings done by your health care provider during your annual well check will depend on your age, overall health, lifestyle risk factors, and family history of disease. Counseling  Your health care provider may ask you questions about your:  Alcohol use.  Tobacco use.  Drug use.  Emotional well-being.  Home and relationship well-being.  Sexual activity.  Eating habits.  History of falls.  Memory and ability to understand (cognition).  Work and work Statistician.  Reproductive health. Screening  You may have the following tests or measurements:  Height, weight, and BMI.  Blood pressure.  Lipid and cholesterol levels. These may be checked every 5 years, or more frequently if you are over 79 years old.  Skin check.  Lung cancer screening. You may have this screening every year starting at age 62 if you have a 30-pack-year history of smoking and currently smoke or have quit within the past 15 years.  Fecal occult blood test (FOBT) of the stool. You may have this test every year starting at age 50.  Flexible sigmoidoscopy or colonoscopy. You may have a sigmoidoscopy every 5 years or a colonoscopy every 10 years starting at age 6.  Hepatitis C blood test.  Hepatitis B blood test.  Sexually transmitted  disease (STD) testing.  Diabetes screening. This is done by checking your blood sugar (glucose) after you have not eaten for a while (fasting). You may have this done every 1-3 years.  Bone density scan. This is done to screen for osteoporosis. You may have this  done starting at age 67.  Mammogram. This may be done every 1-2 years. Talk to your health care provider about how often you should have regular mammograms. Talk with your health care provider about your test results, treatment options, and if necessary, the need for more tests. Vaccines  Your health care provider may recommend certain vaccines, such as:  Influenza vaccine. This is recommended every year.  Tetanus, diphtheria, and acellular pertussis (Tdap, Td) vaccine. You may need a Td booster every 10 years.  Zoster vaccine. You may need this after age 71.  Pneumococcal 13-valent conjugate (PCV13) vaccine. One dose is recommended after age 28.  Pneumococcal polysaccharide (PPSV23) vaccine. One dose is recommended after age 110. Talk to your health care provider about which screenings and vaccines you need and how often you need them. This information is not intended to replace advice given to you by your health care provider. Make sure you discuss any questions you have with your health care provider. Document Released: 06/29/2015 Document Revised: 02/20/2016 Document Reviewed: 04/03/2015 Elsevier Interactive Patient Education  2017 [place] Prevention in the Home Falls can cause injuries. They can happen to people of all ages. There are many things you can do to make your home safe and to help prevent falls. What can I do on the outside of my home?  Regularly fix the edges of walkways and driveways and fix any cracks.  Remove anything that might make you trip as you walk through a door, such as a raised step or threshold.  Trim any bushes or trees on the path to your home.  Use bright outdoor lighting.  Clear any walking paths of anything that might make someone trip, such as rocks or tools.  Regularly check to see if handrails are loose or broken. Make sure that both sides of any steps have handrails.  Any raised decks and porches should have guardrails on the  edges.  Have any leaves, snow, or ice cleared regularly.  Use sand or salt on walking paths during winter.  Clean up any spills in your garage right away. This includes oil or grease spills. What can I do in the bathroom?  Use night lights.  Install grab bars by the toilet and in the tub and shower. Do not use towel bars as grab bars.  Use non-skid mats or decals in the tub or shower.  If you need to sit down in the shower, use a plastic, non-slip stool.  Keep the floor dry. Clean up any water that spills on the floor as soon as it happens.  Remove soap buildup in the tub or shower regularly.  Attach bath mats securely with double-sided non-slip rug tape.  Do not have throw rugs and other things on the floor that can make you trip. What can I do in the bedroom?  Use night lights.  Make sure that you have a light by your bed that is easy to reach.  Do not use any sheets or blankets that are too big for your bed. They should not hang down onto the floor.  Have a firm chair that has side arms. You can use this for support while you get dressed.  Do not have throw rugs and other things on the floor that can make you trip. What can I do in the kitchen?  Clean up any spills right away.  Avoid walking on wet floors.  Keep items that you use a lot in easy-to-reach places.  If you need to reach something above you, use a strong step stool that has a grab bar.  Keep electrical cords out of the way.  Do not use floor polish or wax that makes floors slippery. If you must use wax, use non-skid floor wax.  Do not have throw rugs and other things on the floor that can make you trip. What can I do with my stairs?  Do not leave any items on the stairs.  Make sure that there are handrails on both sides of the stairs and use them. Fix handrails that are broken or loose. Make sure that handrails are as long as the stairways.  Check any carpeting to make sure that it is firmly  attached to the stairs. Fix any carpet that is loose or worn.  Avoid having throw rugs at the top or bottom of the stairs. If you do have throw rugs, attach them to the floor with carpet tape.  Make sure that you have a light switch at the top of the stairs and the bottom of the stairs. If you do not have them, ask someone to add them for you. What else can I do to help prevent falls?  Wear shoes that:  Do not have high heels.  Have rubber bottoms.  Are comfortable and fit you well.  Are closed at the toe. Do not wear sandals.  If you use a stepladder:  Make sure that it is fully opened. Do not climb a closed stepladder.  Make sure that both sides of the stepladder are locked into place.  Ask someone to hold it for you, if possible.  Clearly mark and make sure that you can see:  Any grab bars or handrails.  First and last steps.  Where the edge of each step is.  Use tools that help you move around (mobility aids) if they are needed. These include:  Canes.  Walkers.  Scooters.  Crutches.  Turn on the lights when you go into a dark area. Replace any light bulbs as soon as they burn out.  Set up your furniture so you have a clear path. Avoid moving your furniture around.  If any of your floors are uneven, fix them.  If there are any pets around you, be aware of where they are.  Review your medicines with your doctor. Some medicines can make you feel dizzy. This can increase your chance of falling. Ask your doctor what other things that you can do to help prevent falls. This information is not intended to replace advice given to you by your health care provider. Make sure you discuss any questions you have with your health care provider. Document Released: 03/29/2009 Document Revised: 11/08/2015 Document Reviewed: 07/07/2014 Elsevier Interactive Patient Education  2017 [employer].

## 2019-04-22 NOTE — Progress Notes (Signed)
I connected with [name] on 04/22/19 at 0830 by phone and verified that I am speaking with the correct person using two identifiers. Location patient: Home Location provider: [facility], Office Persons participating in the virtual visit: Denman George LPN and Dr. Billey Chang    I discussed the limitations of evaluation and management by telemedicine and the availability of in person appointments. The patient expressed understanding and agreed to proceed.  Subjective:   Brandy Houston is a 63 y.o. female who presents for Medicare Annual (Subsequent) preventive examination.  Review of Systems:   Cardiac Risk Factors include: diabetes mellitus;hypertension    Objective:     Vitals: LMP 03/16/2002   There is no height or weight on file to calculate BMI.  Advanced Directives 04/22/2019 11/27/2018 07/07/2018 06/28/2018 03/17/2018 02/01/2018 11/25/2017  Does Patient Have a Medical Advance Directive? _0  No No  Would patient like information on creating a medical advance directive? Yes (MAU/Ambulatory/Procedural Areas - Information given) - No - Patient declined No - Patient declined - No - Patient declined No - Patient declined  Pre-existing out of facility DNR order (yellow form or pink MOST form) - - - - - - -    Tobacco Social History   Tobacco Use  Smoking Status Current Every Day Smoker  . Packs/day: 0.50  . Years: 35.00  . Pack years: 17.50  . Types: Cigarettes  Smokeless Tobacco Never Used     Ready to quit: Not Answered Counseling given: Not Answered   Clinical Intake: patient goes to dialysis on Tuesday, Thursday and Saturday   Pre-visit preparation completed: Yes  Diabetes: Yes(controlled) CBG done?: No Did pt. bring in CBG monitor from home?: No  How often do you need to have someone help you when you read instructions, pamphlets, or other written materials from your doctor or pharmacy?: 2 - Rarely  Interpreter Needed?: No  Information entered by  :: Denman George LPN  Past Medical History:  Diagnosis Date  . Anemia   . Anxiety   . Arthritis    "in my joints" (03/10/2017)  . Chronic diastolic CHF (congestive heart failure) ([place])   . CKD (chronic kidney disease), stage IV ([place])    stage IV. previous HD, none currently 10/30/15 (confirmed 02/05/2017 & 03/10/2017)  . Depression    Chronic  . Diabetic peripheral neuropathy ([place])   . Dysrhythmia    tachycardia, normal ECHO 08-09-14  . GERD (gastroesophageal reflux disease)   . Heart murmur     dx'd 02/05/2017  . Hepatitis C    "tx'd in 2016; I'm negative now" (02/05/2017)  . History of blood transfusion 2017   "w/knee replacement"  . Hypertension   . Osteoarthritis of left knee   . Peripheral neuropathy   . Protein calorie malnutrition ([place])   . Septic arthritis ([place])   . Slow transit constipation   . Type II diabetes mellitus ([name])    IDDM  . Uncontrolled hypertension 02/18/2013  . Unsteady gait    Past Surgical History:  Procedure Laterality Date  . A/V FISTULAGRAM Right 07/07/2018   Procedure: A/V FISTULAGRAM;  Surgeon: Marty Heck, MD;  Location: [facility];  Service: Cardiovascular;  Laterality: Right;  . AMPUTATION Left 03/20/2017   Procedure: LEFT ABOVE KNEE AMPUTATION;  Surgeon: Newt Minion, MD;  Location: [place];  Service: Orthopedics;  Laterality: Left;  . APPENDECTOMY    . AV FISTULA PLACEMENT Left 09/13/2014   Procedure: Brachial Artery  to Brachial Vein Gortex Four - Seven Stretch GRAFT INSERTION Left Forearm;  Surgeon: Mal Misty, MD;  Location: [facility];  Service: Vascular;  Laterality: Left;  . BASCILIC VEIN TRANSPOSITION Right 10/26/2017   Procedure: BASILIC VEIN TRANSPOSITION FIRST STAGE RIGHT ARM;  Surgeon: Conrad Osage, MD;  Location: [facility];  Service: Vascular;  Laterality: Right;  . BASCILIC VEIN TRANSPOSITION Right 02/01/2018   Procedure: SECOND STAGE BASILIC VEIN TRANSPOSITION RIGHT UPPER EXTREMITY;  Surgeon: Marty Heck, MD;   Location: [facility];  Service: Vascular;  Laterality: Right;  . CHOLECYSTECTOMY OPEN    . COLON SURGERY    . EXCISIONAL TOTAL KNEE ARTHROPLASTY WITH ANTIBIOTIC SPACERS Left 02/06/2017   Procedure: Incisional total iknee with antibiotic spacer ;  Surgeon: Leandrew Koyanagi, MD;  Location: [facility];  Service: Orthopedics;  Laterality: Left;  . IR FLUORO GUIDE CV LINE RIGHT  02/10/2017  . IR REMOVAL TUN CV CATH W/O FL  04/01/2017  . IR US GUIDE VASC ACCESS RIGHT  02/10/2017  . IRRIGATION AND DEBRIDEMENT KNEE Left 03/12/2017   Procedure: IRRIGATION AND DEBRIDEMENT LEFT KNEE WITH WOUND VAC APPLICATION;  Surgeon: Leandrew Koyanagi, MD;  Location: [place] [facility];  Service: Orthopedics;  Laterality: Left;  . JOINT REPLACEMENT    . KNEE ARTHROSCOPY Right 08/10/2014   Procedure: ARTHROSCOPY I & D KNEE;  Surgeon: Marianna Payment, MD;  Location: [facility];  Service: Orthopedics;  Laterality: Right;  . KNEE ARTHROSCOPY Left 08/11/2014   Procedure: ARTHROSCOPIC WASHOUT LEFT KNEE;  Surgeon: Marianna Payment, MD;  Location: [facility];  Service: Orthopedics;  Laterality: Left;  . KNEE ARTHROSCOPY Left 08/19/2014   Procedure: ARTHROSCOPIC WASHOUT LEFT KNEE;  Surgeon: Leandrew Koyanagi, MD;  Location: [place];  Service: Orthopedics;  Laterality: Left;  . KNEE ARTHROSCOPY WITH LATERAL MENISECTOMY Left 04/04/2015   Procedure: AND PARTIAL LATERAL MENISECTOMY;  Surgeon: Leandrew Koyanagi, MD;  Location: [facility];  Service: Orthopedics;  Laterality: Left;  . KNEE ARTHROSCOPY WITH MEDIAL MENISECTOMY Left 04/04/2015   Procedure: LEFT KNEE ARTHROSCOPY WITH PARTIAL MEDIAL MENISCECTOMY  AND SYNOVECTOMY;  Surgeon: Leandrew Koyanagi, MD;  Location: [facility];  Service: Orthopedics;  Laterality: Left;  . PERIPHERAL VASCULAR BALLOON ANGIOPLASTY  07/07/2018   Procedure: PERIPHERAL VASCULAR BALLOON ANGIOPLASTY;  Surgeon: Marty Heck, MD;  Location: [facility];  Service: Cardiovascular;;  right AV fistula  . SHOULDER ARTHROSCOPY  Bilateral 08/10/2014   Procedure: I & D BILATERAL SHOULDERS ;  Surgeon: Marianna Payment, MD;  Location: [facility];  Service: Orthopedics;  Laterality: Bilateral;  . SMALL INTESTINE SURGERY     Due to Small Bowel Obstruction; "fixed it when they did my gallbladder OR"  . TEE WITHOUT CARDIOVERSION N/A 08/14/2014   Procedure: TRANSESOPHAGEAL ECHOCARDIOGRAM (TEE);  Surgeon: Thayer Headings, MD;  Location: [facility];  Service: Cardiovascular;  Laterality: N/A;  . TENOSYNOVECTOMY Right 08/11/2014   Procedure: RIGHT WRIST IRRIGATION AND DEBRIDEMENT, TENOSYNOVECTOMY;  Surgeon: Marianna Payment, MD;  Location: [facility];  Service: Orthopedics;  Laterality: Right;  . TOTAL KNEE ARTHROPLASTY Left 11/07/2015   Procedure: LEFT TOTAL KNEE ARTHROPLASTY WITH REVISION OF IMPLANTS;  Surgeon: Leandrew Koyanagi, MD;  Location: [facility];  Service: Orthopedics;  Laterality: Left;  . TUBAL LIGATION     Family History  Problem Relation Age of Onset  . Cancer Mother   . Heart disease Father   . Cancer Sister   . Hypertension Sister   .  Cancer Brother   . Diabetes Brother   . Hypertension Brother    Social History   Socioeconomic History  . Marital status: Single    Spouse name: Not on file  . Number of children: Not on file  . Years of education: Not on file  . Highest education level: Not on file  Occupational History  . Not on file  Social Needs  . Financial resource strain: Not on file  . Food insecurity    Worry: Not on file    Inability: Not on file  . Transportation needs    Medical: Not on file    Non-medical: Not on file  Tobacco Use  . Smoking status: Current Every Day Smoker    Packs/day: 0.50    Years: 35.00    Pack years: 17.50    Types: Cigarettes  . Smokeless tobacco: Never Used  Substance and Sexual Activity  . Alcohol use: Not Currently    Alcohol/week: 0.0 standard drinks    Comment: 03/10/2017 "I drink a wine cooler a few times/year"  . Drug use: No  . Sexual activity: Never   Lifestyle  . Physical activity    Days per week: Not on file    Minutes per session: Not on file  . Stress: Not on file  Relationships  . Social Herbalist on phone: Not on file    Gets together: Not on file    Attends religious service: Not on file    Active member of club or organization: Not on file    Attends meetings of clubs or organizations: Not on file    Relationship status: Not on file  Other Topics Concern  . Not on file  Social History Narrative  . Not on file    Outpatient Encounter Medications as of 04/22/2019  Medication Sig  . Accu-Chek Softclix Lancets lancets Check sugars three times a day for E11.65  . amLODipine (NORVASC) 10 MG tablet Take 1 tablet (10 mg total) by mouth at bedtime.  [name] aspirin EC 81 MG tablet Take 81 mg by mouth daily.  [name] atorvastatin (LIPITOR) 10 MG tablet TAKE 1 TABLET BY MOUTH AT BEDTIME  . Blood Glucose Monitoring Suppl (ACCU-CHEK AVIVA PLUS) W/DEVICE KIT Check sugars TID for E11.65  . calcium acetate (PHOSLO) 667 MG capsule Take 2 capsules by mouth 3 (three) times daily.  . Chlorpheniramine Maleate (ALLERGY PO) Take 1 tablet by mouth daily as needed (allergies).  . cinacalcet (SENSIPAR) 30 MG tablet Take by mouth.  . cloNIDine (CATAPRES) 0.2 MG tablet Take 0.2 mg by mouth 2 (two) times daily.  [name] gabapentin (NEURONTIN) 100 MG capsule Take 200 mg by mouth 3 (three) times daily.  [name] glucose blood (ACCU-CHEK AVIVA) test strip Check sugars TID for E11.65  . hydrALAZINE (APRESOLINE) 100 MG tablet Take 100 mg by mouth as directed. Take 1 tablet TID ONLY ON (SUN MON, WED, FRI)  . Insulin Glargine (LANTUS SOLOSTAR) 100 UNIT/ML Solostar Pen Inject 10 Units into the skin daily.  [name] Devices (ACCU-CHEK SOFTCLIX) lancets Check sugars TID for E11.65  . lidocaine-prilocaine (EMLA) cream Apply 1 application topically once.   [name] omeprazole (PRILOSEC) 20 MG capsule Take 20 mg by mouth daily before breakfast.   . polyethylene glycol (MIRALAX /  GLYCOLAX) 17 g packet Take 17 g by mouth daily as needed for moderate constipation.  . sertraline (ZOLOFT) 100 MG tablet Take 1 tablet (100 mg total) by mouth daily.  . sevelamer carbonate (  RENVELA) 800 MG tablet Take 1,600 mg by mouth 3 (three) times daily with meals.  . traZODone (DESYREL) 50 MG tablet Take 50 mg by mouth at bedtime.  [name] VITAMIN D PO Take by mouth.   No facility-administered encounter medications on file as of 04/22/2019.     Activities of Daily Living In your present state of health, do you have any difficulty performing the following activities: 04/22/2019  Hearing? N  Vision? N  Difficulty concentrating or making decisions? N  Walking or climbing stairs? Y  Comment at times due to muscle weakness; requires use of walker  Dressing or bathing? N  Doing errands, shopping? Y  Preparing Food and eating ? N  Using the Toilet? N  In the past six months, have you accidently leaked urine? N  Do you have problems with loss of bowel control? N  Managing your Medications? N  Managing your Finances? N  Housekeeping or managing your Housekeeping? Y  Some recent data might be hidden    Patient Care Team: [name], MD as PCP - General (Family Medicine) Kidney, Vicenta Aly, MD as Consulting Physician (Infectious Diseases) Newt Minion, MD as Consulting Physician (Orthopedic Surgery) Mora Bellman, MD as Consulting Physician (Obstetrics and Gynecology) Jamal Maes, MD as Consulting Physician (Nephrology)    Assessment:   This is a routine wellness examination for Brandy Houston.  Exercise Activities and Dietary recommendations Current Exercise Habits: The patient does not participate in regular exercise at present  Goals    . Patient Stated     Independently walk without assistance        Fall Risk Fall Risk  04/22/2019 11/25/2017 10/14/2017 08/25/2017 08/13/2017  Falls in the past year? 0 No No No No  Number falls in past yr: - - - - -  Injury with  Fall? 0 - - - -  Follow up Falls evaluation completed;Education provided;Falls prevention discussed - - - -   Is the patient's home free of loose throw rugs in walkways, pet beds, electrical cords, etc?   yes      Grab bars in the bathroom? yes      Handrails on the stairs?   yes      Adequate lighting?   yes  Depression Screen PHQ 2/9 Scores 04/22/2019 11/25/2017 11/25/2017 10/14/2017  PHQ - 2 Score 0 0 0 0  PHQ- 9 Score - - 0 -     Cognitive Function-no cognitive concerns at this time  Alert? Yes         Normal Appearance? N/a  Oriented to person? Yes           Place? Yes  Time? Yes  Recall of three objects? Yes  Can perform simple calculations? Yes  Displays appropriate judgment? Yes  Can read the correct time from a watch face? Yes     Immunization History  Administered Date(s) Administered  . Fluad Quad(high Dose 65+) 02/28/2019  . Hepatitis B, adult 10/18/2014, 11/07/2014, 12/12/2014, 05/27/2018, 06/24/2018, 07/29/2018, 12/23/2018, 01/27/2019, 02/24/2019, 03/31/2019  . Hepatitis B, ped/adol 11/07/2014, 12/12/2014  . Influenza Inj Mdck Quad Pf 08/08/2014  . Influenza, Seasonal, Injecte, Preservative Fre 02/08/2015, 02/13/2016  . Influenza,inj,Quad PF,6+ Mos 08/08/2014, 04/01/2017  . Influenza-Unspecified 08/08/2014  . PPD Test 11/13/2015  . Pneumococcal Polysaccharide-23 08/08/2014  . Pneumococcal-Unspecified 08/08/2014  . Tdap 08/25/2016    Qualifies for Shingles Vaccine?Discussed and patient will check with pharmacy for coverage.  Patient education handout provided   Screening Tests Health Maintenance  Topic Date Due  . MAMMOGRAM  09/16/2018  . PAP SMEAR-Modifier  01/13/2019  . OPHTHALMOLOGY EXAM  08/15/2019  . HEMOGLOBIN A1C  08/28/2019  . FOOT EXAM  12/10/2019  . COLONOSCOPY  07/02/2023  . TETANUS/TDAP  08/26/2026  . INFLUENZA VACCINE  Completed  . Hepatitis C Screening  Completed  . HIV Screening  Completed    Cancer Screenings: Lung: Low Dose CT Chest  recommended if Age 27-80 years, 30 pack-year currently smoking OR have quit w/in 15years. Patient does not qualify. Breast:  Up to date on Mammogram? No; orders placed 02/28/19 Up to date of Bone Density/Dexa? Yes Colorectal: colonoscopy last 07/01/13 with Dr. Newman Pies; repeat in 5 years orders placed     Plan:  I have personally reviewed and addressed the Medicare Annual Wellness questionnaire and have noted the following in the patient's chart:  A. Medical and social history B. Use of alcohol, tobacco or illicit drugs  C. Current medications and supplements D. Functional ability and status E.  Nutritional status F.  Physical activity G. Advance directives H. List of other physicians I.  Hospitalizations, surgeries, and ER visits in previous 12 months J.  [place] such as hearing and vision if needed, cognitive and depression L. Referrals, records requested, and appointments- screening colonoscopy ordered   In addition, I have reviewed and discussed with patient certain preventive protocols, quality metrics, and best practice recommendations. A written personalized care plan for preventive services as well as general preventive health recommendations were provided to patient.   Signed,  Denman George, LPN  Nurse Health Advisor   Nurse Notes: no additional

## 2019-04-22 NOTE — Progress Notes (Signed)
I have reviewed the documentation from the recent AWV done by Courtney Slade, RN; I agree with the documentation and will follow up on any recommendations or abnormal findings as suggested.  

## 2019-04-23 DIAGNOSIS — D631 Anemia in chronic kidney disease: Secondary | ICD-10-CM | POA: Diagnosis not present

## 2019-04-23 DIAGNOSIS — N2581 Secondary hyperparathyroidism of renal origin: Secondary | ICD-10-CM | POA: Diagnosis not present

## 2019-04-23 DIAGNOSIS — E1129 Type 2 diabetes mellitus with other diabetic kidney complication: Secondary | ICD-10-CM | POA: Diagnosis not present

## 2019-04-23 DIAGNOSIS — Z992 Dependence on renal dialysis: Secondary | ICD-10-CM | POA: Diagnosis not present

## 2019-04-23 DIAGNOSIS — N186 End stage renal disease: Secondary | ICD-10-CM | POA: Diagnosis not present

## 2019-04-25 NOTE — Telephone Encounter (Signed)
Please update status of records review.

## 2019-04-26 NOTE — Telephone Encounter (Signed)
Dr Silverio Decamp I looked for these records on your desk and I did not see them Im guessing they are all in Care everywhere

## 2019-04-26 NOTE — Telephone Encounter (Signed)
We have requested the form be re-faxed as it is not legible.

## 2019-04-26 NOTE — Telephone Encounter (Signed)
Initial telephone note was not routed to me to review the referral.  Please indicate clearly if all the records are in epic, so we do not have to wait for any additional faxed or mailed reports from previous GI.  Last colonoscopy January 2015 at [facility], fair prep, no polyps.  Recommended recall in 5 years. Okay to schedule the procedure.  She will need extended bowel prep.  Thank you

## 2019-04-28 ENCOUNTER — Encounter: Payer: Self-pay | Admitting: Gastroenterology

## 2019-04-28 DIAGNOSIS — N186 End stage renal disease: Secondary | ICD-10-CM | POA: Diagnosis not present

## 2019-04-28 DIAGNOSIS — Z992 Dependence on renal dialysis: Secondary | ICD-10-CM | POA: Diagnosis not present

## 2019-04-28 DIAGNOSIS — D631 Anemia in chronic kidney disease: Secondary | ICD-10-CM | POA: Diagnosis not present

## 2019-04-28 DIAGNOSIS — E1129 Type 2 diabetes mellitus with other diabetic kidney complication: Secondary | ICD-10-CM | POA: Diagnosis not present

## 2019-04-28 DIAGNOSIS — N2581 Secondary hyperparathyroidism of renal origin: Secondary | ICD-10-CM | POA: Diagnosis not present

## 2019-04-28 NOTE — Telephone Encounter (Signed)
Spoke with patient and she stated she will call back to schedule colon with Dr. Silverio Decamp

## 2019-04-30 DIAGNOSIS — N2581 Secondary hyperparathyroidism of renal origin: Secondary | ICD-10-CM | POA: Diagnosis not present

## 2019-04-30 DIAGNOSIS — E1129 Type 2 diabetes mellitus with other diabetic kidney complication: Secondary | ICD-10-CM | POA: Diagnosis not present

## 2019-04-30 DIAGNOSIS — D631 Anemia in chronic kidney disease: Secondary | ICD-10-CM | POA: Diagnosis not present

## 2019-04-30 DIAGNOSIS — Z992 Dependence on renal dialysis: Secondary | ICD-10-CM | POA: Diagnosis not present

## 2019-04-30 DIAGNOSIS — N186 End stage renal disease: Secondary | ICD-10-CM | POA: Diagnosis not present

## 2019-05-03 ENCOUNTER — Telehealth: Payer: Self-pay | Admitting: Family Medicine

## 2019-05-03 DIAGNOSIS — Z992 Dependence on renal dialysis: Secondary | ICD-10-CM | POA: Diagnosis not present

## 2019-05-03 DIAGNOSIS — E1129 Type 2 diabetes mellitus with other diabetic kidney complication: Secondary | ICD-10-CM | POA: Diagnosis not present

## 2019-05-03 DIAGNOSIS — N2581 Secondary hyperparathyroidism of renal origin: Secondary | ICD-10-CM | POA: Diagnosis not present

## 2019-05-03 DIAGNOSIS — N186 End stage renal disease: Secondary | ICD-10-CM | POA: Diagnosis not present

## 2019-05-03 DIAGNOSIS — D631 Anemia in chronic kidney disease: Secondary | ICD-10-CM | POA: Diagnosis not present

## 2019-05-03 NOTE — Telephone Encounter (Signed)
We have faxed this back requested that it be re-faxed to Dr. Jonni Sanger because we cannot read it.  It has yet to be faxed back to this office.

## 2019-05-03 NOTE — Telephone Encounter (Signed)
See note  Copied from [place] [id number]. Topic: General - Other >> May 03, 2019 11:18 AM Brandy Houston wrote: Reason for CRM: Patient states her medicare paper work has not been faxed back to medicare office, patient is calling to check the status.  Patient did not have a lot of info on what paperwork was for.  Call back [phone]

## 2019-05-07 DIAGNOSIS — N186 End stage renal disease: Secondary | ICD-10-CM | POA: Diagnosis not present

## 2019-05-07 DIAGNOSIS — N2581 Secondary hyperparathyroidism of renal origin: Secondary | ICD-10-CM | POA: Diagnosis not present

## 2019-05-07 DIAGNOSIS — D631 Anemia in chronic kidney disease: Secondary | ICD-10-CM | POA: Diagnosis not present

## 2019-05-07 DIAGNOSIS — Z992 Dependence on renal dialysis: Secondary | ICD-10-CM | POA: Diagnosis not present

## 2019-05-07 DIAGNOSIS — E1129 Type 2 diabetes mellitus with other diabetic kidney complication: Secondary | ICD-10-CM | POA: Diagnosis not present

## 2019-05-09 DIAGNOSIS — Z992 Dependence on renal dialysis: Secondary | ICD-10-CM | POA: Diagnosis not present

## 2019-05-09 DIAGNOSIS — N2581 Secondary hyperparathyroidism of renal origin: Secondary | ICD-10-CM | POA: Diagnosis not present

## 2019-05-09 DIAGNOSIS — N186 End stage renal disease: Secondary | ICD-10-CM | POA: Diagnosis not present

## 2019-05-09 DIAGNOSIS — D631 Anemia in chronic kidney disease: Secondary | ICD-10-CM | POA: Diagnosis not present

## 2019-05-09 DIAGNOSIS — E1129 Type 2 diabetes mellitus with other diabetic kidney complication: Secondary | ICD-10-CM | POA: Diagnosis not present

## 2019-05-11 ENCOUNTER — Encounter: Payer: Medicare Other | Admitting: Gastroenterology

## 2019-05-11 ENCOUNTER — Other Ambulatory Visit: Payer: Self-pay

## 2019-05-11 DIAGNOSIS — Z20828 Contact with and (suspected) exposure to other viral communicable diseases: Secondary | ICD-10-CM | POA: Diagnosis not present

## 2019-05-11 DIAGNOSIS — Z20822 Contact with and (suspected) exposure to covid-19: Secondary | ICD-10-CM

## 2019-05-12 LAB — NOVEL CORONAVIRUS, NAA: SARS-CoV-2, NAA: NOT DETECTED

## 2019-05-14 DIAGNOSIS — Z992 Dependence on renal dialysis: Secondary | ICD-10-CM | POA: Diagnosis not present

## 2019-05-14 DIAGNOSIS — N2581 Secondary hyperparathyroidism of renal origin: Secondary | ICD-10-CM | POA: Diagnosis not present

## 2019-05-14 DIAGNOSIS — D631 Anemia in chronic kidney disease: Secondary | ICD-10-CM | POA: Diagnosis not present

## 2019-05-14 DIAGNOSIS — N186 End stage renal disease: Secondary | ICD-10-CM | POA: Diagnosis not present

## 2019-05-14 DIAGNOSIS — E1129 Type 2 diabetes mellitus with other diabetic kidney complication: Secondary | ICD-10-CM | POA: Diagnosis not present

## 2019-05-16 ENCOUNTER — Encounter: Payer: Self-pay | Admitting: Gastroenterology

## 2019-05-20 NOTE — Telephone Encounter (Signed)
This has been faxed multiple times.  I have faxed it again.

## 2019-05-20 NOTE — Telephone Encounter (Signed)
Calling from home [facility] called and stated that for is still incomplete. They would like hight and weight filled. Questions 30 needs a medical reason why equipment is neccessary. Please advise

## 2019-05-20 NOTE — Telephone Encounter (Signed)
See note

## 2019-05-25 ENCOUNTER — Encounter: Payer: Self-pay | Admitting: Physician Assistant

## 2019-05-25 ENCOUNTER — Other Ambulatory Visit: Payer: Self-pay

## 2019-05-25 ENCOUNTER — Ambulatory Visit (INDEPENDENT_AMBULATORY_CARE_PROVIDER_SITE_OTHER): Payer: Medicare Other | Admitting: Physician Assistant

## 2019-05-25 ENCOUNTER — Ambulatory Visit (INDEPENDENT_AMBULATORY_CARE_PROVIDER_SITE_OTHER): Payer: Medicare Other

## 2019-05-25 VITALS — Ht 67.0 in | Wt 144.0 lb

## 2019-05-25 DIAGNOSIS — M25512 Pain in left shoulder: Secondary | ICD-10-CM

## 2019-05-25 MED ORDER — PREDNISONE 10 MG PO TABS: 10.0000 mg | ORAL_TABLET | Freq: Every day | ORAL | 1 refills | Status: DC

## 2019-05-25 NOTE — Progress Notes (Addendum)
Office Visit Note   Patient: Brandy Houston           Date of Birth: 1956-04-27           MRN: [id number] Visit Date: 05/25/2019              Requested by: [name], [address] [place],  [place] [id number] PCP: [name], MD  Chief Complaint  Patient presents with  . Left Shoulder - Pain  . Neck - Pain      HPI: This is a pleasant woman with a 1 week history of left scapular pain.  She has a history 10 years ago of some type of shoulder surgery though she does not remember what it was she thinks she had an infection at the time.  She denies any injury but she does say that she remembers having a lot of spasms and then the pain along her left scapula border remained it radiates into her neck sometimes she denies any chest pain shortness of breath.  She was seen by her provider who prescribed a muscle relaxant a week ago and this did not help her very much.  She denies any paresthesias or weakness  Assessment & Plan: Visit Diagnoses:  1. Left shoulder pain, unspecified chronicity     Plan: We will try a course of prednisone to take with food.  She will discuss this with her nephrologist but this should not be a problem. She will need xrays of her cspine at her next visit  Follow-Up Instructions: No follow-ups on file.   Ortho Exam  Patient is alert, oriented, no adenopathy, well-dressed, normal affect, normal respiratory effort. Left shoulder: Well-healed surgical incisions.  She has no pain with any manipulation of the shoulder or internal or external rotation good strength with resisted abduction.  No cellulitis distal CMS is intact she is tender to palpation around the medial border of the scapula  Imaging: 2 views shoulder today demonstrate no acute shoulder abnormalities. Neck is visible with degenerative changes  Labs: Lab Results  Component Value Date   HGBA1C 6.3 (A) 02/28/2019   HGBA1C 5.4 12/10/2018   HGBA1C 6.2 (H) 02/01/2018   ESRSEDRATE  99 (H) 07/21/2017   ESRSEDRATE 102 (H) 10/29/2015   ESRSEDRATE 120 (H) 08/09/2014   CRP 11.1 (H) 07/21/2017   CRP 1.9 (H) 03/12/2017   CRP 1.4 (H) 10/29/2015   REPTSTATUS 02/10/2017 FINAL 02/08/2017   GRAMSTAIN  02/06/2017    FEW WBC PRESENT, PREDOMINANTLY MONONUCLEAR NO ORGANISMS SEEN    CULT >=100,000 COLONIES/mL YEAST (A) 02/08/2017   LABORGA NO GROWTH 02/03/2017     Lab Results  Component Value Date   ALBUMIN 3.9 12/10/2018   ALBUMIN 3.8 11/25/2017   ALBUMIN 2.0 (L) 03/27/2017    Lab Results  Component Value Date   MG 2.6 (H) 11/27/2018   MG 1.9 03/18/2017   MG 2.0 03/17/2017   No results found for: VD25OH  No results found for: PREALBUMIN CBC EXTENDED Latest Ref Rng & Units 12/10/2018 11/27/2018 07/07/2018  WBC 4.0 - 10.5 K/uL 5.5 7.8 -  RBC 3.87 - 5.11 Mil/uL 2.66 Repeated and verified X2.(L) 2.28(L) -  HGB 12.0 - 15.0 g/dL 8.8 Repeated and verified X2.(L) 7.1(L) 12.6  HCT 36.0 - 46.0 % 26.1 Repeated and verified X2.(L) 22.1(L) 37.0  PLT 150.0 - 400.0 K/uL 214.0 178 -  NEUTROABS 1.4 - 7.7 K/uL 3.9 - -  LYMPHSABS 0.7 - 4.0 K/uL 0.9 - -  Body mass index is 22.55 kg/m.  Orders:  Orders Placed This Encounter  Procedures  . XR Shoulder Left   Meds ordered this encounter  Medications  . predniSONE (DELTASONE) 10 MG tablet    Sig: Take 1 tablet (10 mg total) by mouth daily with breakfast.    Dispense:  30 tablet    Refill:  1     Procedures: No procedures performed  Clinical Data: No additional findings.  ROS:  All other systems negative, except as noted in the HPI. Review of Systems  Objective: Vital Signs: Ht 5\' 7"  (1.702 m)   Wt 144 lb (65.3 kg)   LMP 03/16/2002   BMI 22.55 kg/m   Specialty Comments:  No specialty comments available.  PMFS History: Patient Active Problem List   Diagnosis Date Noted  . On statin therapy due to risk of future cardiovascular event 12/10/2018  . Benign essential HTN   . Stage 5 chronic kidney disease  not on chronic dialysis ([facility])   . Above knee amputation status, left   . 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 Anemia of chronic disease 02/05/2017  . Dysplasia of cervix, high grade CIN 2 01/12/2017  . Chronic diastolic CHF (congestive heart failure) ([facility]) 11/08/2015  . Total knee replacement status   . Diabetic peripheral neuropathy ([facility])   . Major depression, recurrent, chronic ([facility])   . Chronic kidney disease (CKD), stage IV (severe) ([facility])   . Hepatitis C 08/16/2014  . Peripheral neuropathy 06/24/2013  . Compulsive tobacco user syndrome 06/24/2013   Past Medical History:  Diagnosis Date  . Anemia   . Anxiety   . Arthritis    "in my joints" (03/10/2017)  . Chronic diastolic CHF (congestive heart failure) ([facility])   . CKD (chronic kidney disease), stage IV ([facility])    stage IV. previous HD, none currently 10/30/15 (confirmed 02/05/2017 & 03/10/2017)  . Depression    Chronic  . Diabetic peripheral neuropathy ([facility])   . Dysrhythmia    tachycardia, normal ECHO 08-09-14  . GERD (gastroesophageal reflux disease)   . Heart murmur     dx'd 02/05/2017  . Hepatitis C    "tx'd in 2016; I'm negative now" (02/05/2017)  . History of blood transfusion 2017   "w/knee replacement"  . Hypertension   . Osteoarthritis of left knee   . Peripheral neuropathy   . Protein calorie malnutrition ([facility])   . Septic arthritis ([facility])   . Slow transit constipation   . Type II diabetes mellitus ([name])    IDDM  . Uncontrolled hypertension 02/18/2013  . Unsteady gait     Family History  Problem Relation Age of Onset  . Cancer Mother   . Heart disease Father   . Cancer Sister   . Hypertension Sister   . Cancer Brother   . Diabetes Brother   . Hypertension Brother     Past Surgical History:  Procedure Laterality Date  . A/V FISTULAGRAM Right 07/07/2018   Procedure: A/V FISTULAGRAM;  Surgeon: Marty Heck, MD;  Location: [facility];  Service: Cardiovascular;  Laterality: Right;  .  AMPUTATION Left 03/20/2017   Procedure: LEFT ABOVE KNEE AMPUTATION;  Surgeon: Newt Minion, MD;  Location: [facility];  Service: Orthopedics;  Laterality: Left;  . APPENDECTOMY    . AV FISTULA PLACEMENT Left 09/13/2014   Procedure: Brachial Artery to Brachial Vein Gortex Four - Seven Stretch GRAFT INSERTION Left Forearm;  Surgeon: Mal Misty, MD;  [facility];  Service: Vascular;  Laterality: Left;  . BASCILIC VEIN TRANSPOSITION Right 10/26/2017   Procedure: BASILIC VEIN TRANSPOSITION FIRST STAGE RIGHT ARM;  Surgeon: Conrad Vesta, MD;  Location: [facility];  Service: Vascular;  Laterality: Right;  . BASCILIC VEIN TRANSPOSITION Right 02/01/2018   Procedure: SECOND STAGE BASILIC VEIN TRANSPOSITION RIGHT UPPER EXTREMITY;  Surgeon: Marty Heck, MD;  Location: [facility];  Service: Vascular;  Laterality: Right;  . CHOLECYSTECTOMY OPEN    . COLON SURGERY    . EXCISIONAL TOTAL KNEE ARTHROPLASTY WITH ANTIBIOTIC SPACERS Left 02/06/2017   Procedure: Incisional total iknee with antibiotic spacer ;  Surgeon: Leandrew Koyanagi, MD;  Location: [facility];  Service: Orthopedics;  Laterality: Left;  . IR FLUORO GUIDE CV LINE RIGHT  02/10/2017  . IR REMOVAL TUN CV CATH W/O FL  04/01/2017  . IR US GUIDE VASC ACCESS RIGHT  02/10/2017  . IRRIGATION AND DEBRIDEMENT KNEE Left 03/12/2017   Procedure: IRRIGATION AND DEBRIDEMENT LEFT KNEE WITH WOUND VAC APPLICATION;  Surgeon: Leandrew Koyanagi, MD;  Location: [facility];  Service: Orthopedics;  Laterality: Left;  . JOINT REPLACEMENT    . KNEE ARTHROSCOPY Right 08/10/2014   Procedure: ARTHROSCOPY I & D KNEE;  Surgeon: Marianna Payment, MD;  Location: [facility];  Service: Orthopedics;  Laterality: Right;  . KNEE ARTHROSCOPY Left 08/11/2014   Procedure: ARTHROSCOPIC WASHOUT LEFT KNEE;  Surgeon: Marianna Payment, MD;  Location: [facility];  Service: Orthopedics;  Laterality: Left;  . KNEE ARTHROSCOPY Left 08/19/2014   Procedure: ARTHROSCOPIC WASHOUT LEFT KNEE;  Surgeon: Leandrew Koyanagi, MD;  Location:  [facility];  Service: Orthopedics;  Laterality: Left;  . KNEE ARTHROSCOPY WITH LATERAL MENISECTOMY Left 04/04/2015   Procedure: AND PARTIAL LATERAL MENISECTOMY;  Surgeon: Leandrew Koyanagi, MD;  Location: [facility];  Service: Orthopedics;  Laterality: Left;  . KNEE ARTHROSCOPY WITH MEDIAL MENISECTOMY Left 04/04/2015   Procedure: LEFT KNEE ARTHROSCOPY WITH PARTIAL MEDIAL MENISCECTOMY  AND SYNOVECTOMY;  Surgeon: Leandrew Koyanagi, MD;  Location: [facility];  Service: Orthopedics;  Laterality: Left;  . PERIPHERAL VASCULAR BALLOON ANGIOPLASTY  07/07/2018   Procedure: PERIPHERAL VASCULAR BALLOON ANGIOPLASTY;  Surgeon: Marty Heck, MD;  Location: [facility];  Service: Cardiovascular;;  right AV fistula  . SHOULDER ARTHROSCOPY Bilateral 08/10/2014   Procedure: I & D BILATERAL SHOULDERS ;  Surgeon: Marianna Payment, MD;  Location[facility];  Service: Orthopedics;  Laterality: Bilateral;  . SMALL INTESTINE SURGERY     Due to Small Bowel Obstruction; "fixed it when they did my gallbladder OR"  . TEE WITHOUT CARDIOVERSION N/A 08/14/2014   Procedure: TRANSESOPHAGEAL ECHOCARDIOGRAM (TEE);  Surgeon: Thayer Headings, MD;  Location: [facility];  Service: Cardiovascular;  Laterality: N/A;  . TENOSYNOVECTOMY Right 08/11/2014   Procedure: RIGHT WRIST IRRIGATION AND DEBRIDEMENT, TENOSYNOVECTOMY;  Surgeon: Marianna Payment, MD;  Location: [facility];  Service: Orthopedics;  Laterality: Right;  . TOTAL KNEE ARTHROPLASTY Left 11/07/2015   Procedure: LEFT TOTAL KNEE ARTHROPLASTY WITH REVISION OF IMPLANTS;  Surgeon: Leandrew Koyanagi, MD;  Location: [facility];  Service: Orthopedics;  Laterality: Left;  . TUBAL LIGATION     Social History   Occupational History  . Not on file  Tobacco Use  . Smoking status: Current Every Day Smoker    Packs/day: 0.50    Years: 35.00    Pack years: 17.50    Types: Cigarettes  . Smokeless tobacco: Never Used  Substance  and Sexual Activity  . Alcohol use: Not  Currently    Alcohol/week: 0.0 standard drinks    Comment: 03/10/2017 "I drink a wine cooler a few times/year"  . Drug use: No  . Sexual activity: Never

## 2019-05-30 ENCOUNTER — Telehealth: Payer: Self-pay | Admitting: *Deleted

## 2019-05-30 ENCOUNTER — Telehealth: Payer: Self-pay | Admitting: Physician Assistant

## 2019-05-30 NOTE — Telephone Encounter (Signed)
Please advise, thank you. Is this okay for this patient?

## 2019-05-30 NOTE — Telephone Encounter (Signed)
John,  Cn you please review this chart an dlet me know if this pt qualifies for a [facility] colon . She has a significant medical hx- cardiac hx 11-2018 thru 03-2019- cardio states 2 types of chest pain- 1. Spasms after dialysis 2. Anginal pain- ECHO and stress test both normal 04-13-2019 an d10-30-2020. She is a HD pt, she just has a complicated medical hx. Let me know if you think she needs an OV prior to her colon please  Thanks, Lelan Pons

## 2019-05-30 NOTE — Telephone Encounter (Signed)
Marie,  This pt is cleared for anesthetic care at [facility].  Thanks,  Flordia Kassem 

## 2019-05-30 NOTE — Telephone Encounter (Signed)
We did not order Gabapentin  but she is a dialysis patient  so needs to run dosing changes by her nephrologist. Can't give her anything stronger. She is already on muscle relaxants

## 2019-05-30 NOTE — Telephone Encounter (Signed)
Patient called asked if the Gabapentin dosage can be increased to 300mg . Patient said the current dosage does not work for her. Patient also asked if she can get something other than prednisone for her shoulder? The number to contact patient is [phone]

## 2019-05-31 NOTE — Telephone Encounter (Signed)
Pt called in, I advised pt of Brandy Houston's message.

## 2019-05-31 NOTE — Telephone Encounter (Signed)
Thank you, noted.

## 2019-06-04 ENCOUNTER — Encounter (HOSPITAL_COMMUNITY): Payer: Self-pay

## 2019-06-04 ENCOUNTER — Inpatient Hospital Stay (HOSPITAL_COMMUNITY)
Admission: EM | Admit: 2019-06-04 | Discharge: 2019-06-06 | DRG: 189 | Disposition: A | Discharge status: Home / Self Care | Payer: Medicare Other | Attending: Internal Medicine | Admitting: Internal Medicine

## 2019-06-04 ENCOUNTER — Other Ambulatory Visit: Payer: Self-pay

## 2019-06-04 DIAGNOSIS — I5032 Chronic diastolic (congestive) heart failure: Secondary | ICD-10-CM | POA: Diagnosis present

## 2019-06-04 DIAGNOSIS — M1990 Unspecified osteoarthritis, unspecified site: Secondary | ICD-10-CM | POA: Diagnosis present

## 2019-06-04 DIAGNOSIS — Z809 Family history of malignant neoplasm, unspecified: Secondary | ICD-10-CM

## 2019-06-04 DIAGNOSIS — Z96652 Presence of left artificial knee joint: Secondary | ICD-10-CM | POA: Diagnosis present

## 2019-06-04 DIAGNOSIS — R0603 Acute respiratory distress: Secondary | ICD-10-CM | POA: Diagnosis not present

## 2019-06-04 DIAGNOSIS — E1142 Type 2 diabetes mellitus with diabetic polyneuropathy: Secondary | ICD-10-CM | POA: Diagnosis present

## 2019-06-04 DIAGNOSIS — Z7982 Long term (current) use of aspirin: Secondary | ICD-10-CM

## 2019-06-04 DIAGNOSIS — Z888 Allergy status to other drugs, medicaments and biological substances status: Secondary | ICD-10-CM

## 2019-06-04 DIAGNOSIS — M898X9 Other specified disorders of bone, unspecified site: Secondary | ICD-10-CM | POA: Diagnosis present

## 2019-06-04 DIAGNOSIS — Z20828 Contact with and (suspected) exposure to other viral communicable diseases: Secondary | ICD-10-CM | POA: Diagnosis not present

## 2019-06-04 DIAGNOSIS — I248 Other forms of acute ischemic heart disease: Secondary | ICD-10-CM | POA: Diagnosis not present

## 2019-06-04 DIAGNOSIS — Z91013 Allergy to seafood: Secondary | ICD-10-CM

## 2019-06-04 DIAGNOSIS — K219 Gastro-esophageal reflux disease without esophagitis: Secondary | ICD-10-CM | POA: Diagnosis present

## 2019-06-04 DIAGNOSIS — N186 End stage renal disease: Secondary | ICD-10-CM | POA: Diagnosis not present

## 2019-06-04 DIAGNOSIS — D631 Anemia in chronic kidney disease: Secondary | ICD-10-CM | POA: Diagnosis present

## 2019-06-04 DIAGNOSIS — I160 Hypertensive urgency: Secondary | ICD-10-CM

## 2019-06-04 DIAGNOSIS — F17210 Nicotine dependence, cigarettes, uncomplicated: Secondary | ICD-10-CM | POA: Diagnosis present

## 2019-06-04 DIAGNOSIS — Z91041 Radiographic dye allergy status: Secondary | ICD-10-CM

## 2019-06-04 DIAGNOSIS — J9601 Acute respiratory failure with hypoxia: Secondary | ICD-10-CM | POA: Diagnosis not present

## 2019-06-04 DIAGNOSIS — J810 Acute pulmonary edema: Secondary | ICD-10-CM

## 2019-06-04 DIAGNOSIS — E1122 Type 2 diabetes mellitus with diabetic chronic kidney disease: Secondary | ICD-10-CM

## 2019-06-04 DIAGNOSIS — Z833 Family history of diabetes mellitus: Secondary | ICD-10-CM

## 2019-06-04 DIAGNOSIS — Z794 Long term (current) use of insulin: Secondary | ICD-10-CM

## 2019-06-04 DIAGNOSIS — Z882 Allergy status to sulfonamides status: Secondary | ICD-10-CM

## 2019-06-04 DIAGNOSIS — I132 Hypertensive heart and chronic kidney disease with heart failure and with stage 5 chronic kidney disease, or end stage renal disease: Secondary | ICD-10-CM | POA: Diagnosis present

## 2019-06-04 DIAGNOSIS — Z79899 Other long term (current) drug therapy: Secondary | ICD-10-CM

## 2019-06-04 DIAGNOSIS — R9431 Abnormal electrocardiogram [ECG] [EKG]: Secondary | ICD-10-CM

## 2019-06-04 DIAGNOSIS — I509 Heart failure, unspecified: Secondary | ICD-10-CM

## 2019-06-04 DIAGNOSIS — F40240 Claustrophobia: Secondary | ICD-10-CM | POA: Diagnosis present

## 2019-06-04 DIAGNOSIS — Z992 Dependence on renal dialysis: Secondary | ICD-10-CM

## 2019-06-04 DIAGNOSIS — Z89612 Acquired absence of left leg above knee: Secondary | ICD-10-CM

## 2019-06-04 DIAGNOSIS — Z8249 Family history of ischemic heart disease and other diseases of the circulatory system: Secondary | ICD-10-CM

## 2019-06-04 NOTE — ED Provider Notes (Addendum)
[facility] EMERGENCY DEPARTMENT Provider Note   CSN: [id number] Arrival date & time: 06/04/19  2328     History Chief Complaint  Patient presents with  . Respiratory Distress    Brandy Houston is a 63 y.o. female.  Patient presents to the emergency department for evaluation of difficulty breathing.  Patient reports that she has been experiencing difficulty breathing for the last several days but it worsened tonight.  She is a dialysis patient, missed dialysis today because she felt too bad to go.  Patient is brought to the ER from home by EMS.  EMS reports significant respiratory distress with room air oxygen saturation of 80% upon their arrival.  She was treated with Solu-Medrol, magnesium and epi for her breathing difficulty.  At arrival patient reports that she does feel some slight improvement.  She is experiencing chest discomfort that she reports feels like heartburn.        Past Medical History:  Diagnosis Date  . Anemia   . Anxiety   . Arthritis    "in my joints" (03/10/2017)  . Chronic diastolic CHF (congestive heart failure) ([place])   . CKD (chronic kidney disease), stage IV ([place])    stage IV. previous HD, none currently 10/30/15 (confirmed 02/05/2017 & 03/10/2017)  . Depression    Chronic  . Diabetic peripheral neuropathy ([place])   . Dysrhythmia    tachycardia, normal ECHO 08-09-14  . GERD (gastroesophageal reflux disease)   . Heart murmur     dx'd 02/05/2017  . Hepatitis C    "tx'd in 2016; I'm negative now" (02/05/2017)  . History of blood transfusion 2017   "w/knee replacement"  . Hypertension   . Osteoarthritis of left knee   . Peripheral neuropathy   . Protein calorie malnutrition ([place])   . Septic arthritis ([place])   . Slow transit constipation   . Type II diabetes mellitus ([name])    IDDM  . Uncontrolled hypertension 02/18/2013  . Unsteady gait     Patient Active Problem List   Diagnosis Date Noted  . On statin therapy due to risk of future  cardiovascular event 12/10/2018  . Benign essential HTN   . Stage 5 chronic kidney disease not on chronic dialysis ([place])   . Above knee amputation status, left   . DM type 2 causing CKD stage 5 ([place]) 03/21/2017  . Anemia of chronic disease 02/05/2017  . Dysplasia of cervix, high grade CIN 2 01/12/2017  . Chronic diastolic CHF (congestive heart failure) ([place]) 11/08/2015  . Total knee replacement status   . Diabetic peripheral neuropathy ([place])   . Major depression, recurrent, chronic ([place])   . Chronic kidney disease (CKD), stage IV (severe) ([place])   . Hepatitis C 08/16/2014  . Peripheral neuropathy 06/24/2013  . Compulsive tobacco user syndrome 06/24/2013    Past Surgical History:  Procedure Laterality Date  . A/V FISTULAGRAM Right 07/07/2018   Procedure: A/V FISTULAGRAM;  Surgeon: Marty Heck, MD;  Location: [facility];  Service: Cardiovascular;  Laterality: Right;  . AMPUTATION Left 03/20/2017   Procedure: LEFT ABOVE KNEE AMPUTATION;  Surgeon: Newt Minion, MD;  Location: [facility];  Service: Orthopedics;  Laterality: Left;  . APPENDECTOMY    . AV FISTULA PLACEMENT Left 09/13/2014   Procedure: Brachial Artery to Brachial Vein Gortex Four - Seven Stretch GRAFT INSERTION Left Forearm;  Surgeon: Mal Misty, MD;  Location: [facility];  Service: Vascular;  Laterality: Left;  . BASCILIC VEIN TRANSPOSITION  Right 10/26/2017   Procedure: BASILIC VEIN TRANSPOSITION FIRST STAGE RIGHT ARM;  Surgeon: Conrad Orofino, MD;  Location: [facility];  Service: Vascular;  Laterality: Right;  . BASCILIC VEIN TRANSPOSITION Right 02/01/2018   Procedure: SECOND STAGE BASILIC VEIN TRANSPOSITION RIGHT UPPER EXTREMITY;  Surgeon: Marty Heck, MD;  Location: [facility];  Service: Vascular;  Laterality: Right;  . CHOLECYSTECTOMY OPEN    . COLON SURGERY    . EXCISIONAL TOTAL KNEE ARTHROPLASTY WITH ANTIBIOTIC SPACERS Left 02/06/2017   Procedure: Incisional total iknee with antibiotic spacer ;  Surgeon: Leandrew Koyanagi, MD;  Location: [facility];  Service: Orthopedics;  Laterality: Left;  . IR FLUORO GUIDE CV LINE RIGHT  02/10/2017  . IR REMOVAL TUN CV CATH W/O FL  04/01/2017  . IR US GUIDE VASC ACCESS RIGHT  02/10/2017  . IRRIGATION AND DEBRIDEMENT KNEE Left 03/12/2017   Procedure: IRRIGATION AND DEBRIDEMENT LEFT KNEE WITH WOUND VAC APPLICATION;  Surgeon: Leandrew Koyanagi, MD;  Location: [facility];  Service: Orthopedics;  Laterality: Left;  . JOINT REPLACEMENT    . KNEE ARTHROSCOPY Right 08/10/2014   Procedure: ARTHROSCOPY I & D KNEE;  Surgeon: Marianna Payment, MD;  Location: [facility];  Service: Orthopedics;  Laterality: Right;  . KNEE ARTHROSCOPY Left 08/11/2014   Procedure: ARTHROSCOPIC WASHOUT LEFT KNEE;  Surgeon: Marianna Payment, MD;  Location: [facility];  Service: Orthopedics;  Laterality: Left;  . KNEE ARTHROSCOPY Left 08/19/2014   Procedure: ARTHROSCOPIC WASHOUT LEFT KNEE;  Surgeon: Leandrew Koyanagi, MD;  Location: [facility];  Service: Orthopedics;  Laterality: Left;  . KNEE ARTHROSCOPY WITH LATERAL MENISECTOMY Left 04/04/2015   Procedure: AND PARTIAL LATERAL MENISECTOMY;  Surgeon: Leandrew Koyanagi, MD;  Location: [facility];  Service: Orthopedics;  Laterality: Left;  . KNEE ARTHROSCOPY WITH MEDIAL MENISECTOMY Left 04/04/2015   Procedure: LEFT KNEE ARTHROSCOPY WITH PARTIAL MEDIAL MENISCECTOMY  AND SYNOVECTOMY;  Surgeon: Leandrew Koyanagi, MD;  Location: [facility];  Service: Orthopedics;  Laterality: Left;  . PERIPHERAL VASCULAR BALLOON ANGIOPLASTY  07/07/2018   Procedure: PERIPHERAL VASCULAR BALLOON ANGIOPLASTY;  Surgeon: Marty Heck, MD;  Location: [facility];  Service: Cardiovascular;;  right AV fistula  . SHOULDER ARTHROSCOPY Bilateral 08/10/2014   Procedure: I & D BILATERAL SHOULDERS ;  Surgeon: Marianna Payment, MD;  Location: [facility];  Service: Orthopedics;  Laterality: Bilateral;  . SMALL INTESTINE SURGERY     Due to Small Bowel Obstruction; "fixed it when they did my  gallbladder OR"  . TEE WITHOUT CARDIOVERSION N/A 08/14/2014   Procedure: TRANSESOPHAGEAL ECHOCARDIOGRAM (TEE);  Surgeon: Thayer Headings, MD;  Location: [facility];  Service: Cardiovascular;  Laterality: N/A;  . TENOSYNOVECTOMY Right 08/11/2014   Procedure: RIGHT WRIST IRRIGATION AND DEBRIDEMENT, TENOSYNOVECTOMY;  Surgeon: Marianna Payment, MD;  Location: [facility];  Service: Orthopedics;  Laterality: Right;  . TOTAL KNEE ARTHROPLASTY Left 11/07/2015   Procedure: LEFT TOTAL KNEE ARTHROPLASTY WITH REVISION OF IMPLANTS;  Surgeon: Leandrew Koyanagi, MD;  Location: [facility];  Service: Orthopedics;  Laterality: Left;  . TUBAL LIGATION       OB History    Gravida  5   Para  0   Term  0   Preterm      AB  2   Living  3     SAB  1   TAB  1   Ectopic      Multiple      Live Births  Family History  Problem Relation Age of Onset  . Cancer Mother   . Heart disease Father   . Cancer Sister   . Hypertension Sister   . Cancer Brother   . Diabetes Brother   . Hypertension Brother     Social History   Tobacco Use  . Smoking status: Current Every Day Smoker    Packs/day: 0.50    Years: 35.00    Pack years: 17.50    Types: Cigarettes  . Smokeless tobacco: Never Used  Substance Use Topics  . Alcohol use: Not Currently    Alcohol/week: 0.0 standard drinks    Comment: 03/10/2017 "I drink a wine cooler a few times/year"  . Drug use: No    Home Medications Prior to Admission medications   Medication Sig Start Date End Date Taking? Authorizing Provider  Accu-Chek Softclix Lancets lancets Check sugars three times a day for E11.65 09/20/18   [name], MD  amLODipine (NORVASC) 10 MG tablet Take 1 tablet (10 mg total) by mouth at bedtime. 03/31/17   Angiulli, Lavon Paganini, PA-C  aspirin EC 81 MG tablet Take 81 mg by mouth daily.    [provider]  atorvastatin (LIPITOR) 10 MG tablet TAKE 1 TABLET BY MOUTH AT BEDTIME 02/14/19   [name], MD  Blood Glucose  Monitoring Suppl (ACCU-CHEK AVIVA PLUS) W/DEVICE KIT Check sugars TID for E11.65 01/29/15   Chari Manning A, NP  calcium acetate (PHOSLO) 667 MG capsule Take 2 capsules by mouth 3 (three) times daily. 01/13/19   [provider]  Chlorpheniramine Maleate (ALLERGY PO) Take 1 tablet by mouth daily as needed (allergies).    [provider]  cinacalcet (SENSIPAR) 30 MG tablet Take by mouth. 12/11/18 12/10/19  [provider]  cloNIDine (CATAPRES) 0.2 MG tablet Take 0.2 mg by mouth 2 (two) times daily.    [provider]  gabapentin (NEURONTIN) 100 MG capsule Take 200 mg by mouth 3 (three) times daily. 11/01/18   [provider]  glucose blood (ACCU-CHEK AVIVA) test strip Check sugars TID for E11.65 07/21/18   [name], MD  hydrALAZINE (APRESOLINE) 100 MG tablet Take 100 mg by mouth as directed. Take 1 tablet TID ONLY ON (SUN MON, WED, FRI)    [provider]  Insulin Glargine (LANTUS SOLOSTAR) 100 UNIT/ML Solostar Pen Inject 10 Units into the skin daily. 02/28/19   [name], MD  Lancet Devices [facility]) lancets Check sugars TID for E11.65 01/29/15   Chari Manning A, NP  lidocaine-prilocaine (EMLA) cream Apply 1 application topically once.  10/25/18   [provider]  omeprazole (PRILOSEC) 20 MG capsule Take 20 mg by mouth daily before breakfast.  10/04/15   [provider]  polyethylene glycol (MIRALAX / GLYCOLAX) 17 g packet Take 17 g by mouth daily as needed for moderate constipation. 12/10/18   [name], MD  predniSONE (DELTASONE) 10 MG tablet Take 1 tablet (10 mg total) by mouth daily with breakfast. 05/25/19   Persons, Bevely Palmer, PA  sertraline (ZOLOFT) 100 MG tablet Take 1 tablet (100 mg total) by mouth daily. 11/05/18   [name], MD  sevelamer carbonate (RENVELA) 800 MG tablet Take 1,600 mg by mouth 3 (three) times daily with meals.    [provider]  traZODone (DESYREL) 50 MG tablet Take 50  mg by mouth at bedtime. 10/27/18   [provider]  VITAMIN D PO Take by mouth. 09/30/18 09/29/19  [provider]    Allergies    Compazine [prochlorperazine], Shellfish-derived products, Iodinated diagnostic agents, Omnipaque [iohexol], Tape, and Sulfa antibiotics  Review of Systems   Review of Systems  Respiratory: Positive for chest tightness and shortness of breath.   All other systems reviewed and are negative.   Physical Exam Updated Vital Signs BP (!) 182/86 (BP Location: Left Arm)   Pulse (!) 104   Temp (!) 97.4 F (36.3 C) (Oral)   Resp (!) 23   Ht '5\' 7"'  (1.702 m)   Wt 60.8 kg   LMP 03/16/2002   SpO2 99%   BMI 20.99 kg/m   Physical Exam Vitals and nursing note reviewed.  Constitutional:      General: She is not in acute distress.    Appearance: Normal appearance. She is well-developed.  HENT:     Head: Normocephalic and atraumatic.     Right Ear: Hearing normal.     Left Ear: Hearing normal.     Nose: Nose normal.  Eyes:     Conjunctiva/sclera: Conjunctivae normal.     Pupils: Pupils are equal, round, and reactive to light.  Cardiovascular:     Rate and Rhythm: Regular rhythm. Tachycardia present.     Heart sounds: S1 normal and S2 normal. No murmur. No friction rub. No gallop.   Pulmonary:     Effort: Pulmonary effort is normal. No respiratory distress.     Breath sounds: Normal breath sounds.  Chest:     Chest wall: No tenderness.  Abdominal:     General: Bowel sounds are normal.     Palpations: Abdomen is soft.     Tenderness: There is no abdominal tenderness. There is no guarding or rebound. Negative signs include Murphy's sign and McBurney's sign.     Hernia: No hernia is present.  Musculoskeletal:        General: Normal range of motion.     Cervical back: Normal range of motion and neck supple.  Skin:    General: Skin is warm and dry.     Findings: No rash.  Neurological:     Mental Status: She is alert and oriented to person,  place, and time.     GCS: GCS eye subscore is 4. GCS verbal subscore is 5. GCS motor subscore is 6.     Cranial Nerves: No cranial nerve deficit.     Sensory: No sensory deficit.     Coordination: Coordination normal.  Psychiatric:        Speech: Speech normal.        Behavior: Behavior normal.        Thought Content: Thought content normal.     ED Results / Procedures / Treatments   Labs (all labs ordered are listed, but only abnormal results are displayed) Labs Reviewed  CBC WITH DIFFERENTIAL/PLATELET - Abnormal; Notable for the following components:      Result Value   RBC 2.80 (*)    Hemoglobin 9.1 (*)    HCT 29.8 (*)    MCV 106.4 (*)    nRBC 0.5 (*)    Abs Immature Granulocytes 0.14 (*)    All other components within normal limits  BASIC METABOLIC PANEL - Abnormal; Notable for the following components:   CO2 20 (*)    Glucose, Bld 137 (*)    BUN 48 (*)    Creatinine, Ser 5.17 (*)    GFR calc non Af Amer 8 (*)    GFR calc Af Amer 9 (*)  All other components within normal limits  I-STAT CHEM 8, ED - Abnormal; Notable for the following components:   BUN 52 (*)    Creatinine, Ser 5.00 (*)    Glucose, Bld 131 (*)    Hemoglobin 9.9 (*)    HCT 29.0 (*)    All other components within normal limits  TROPONIN I (HIGH SENSITIVITY) - Abnormal; Notable for the following components:   Troponin I (High Sensitivity) 34 (*)    All other components within normal limits  POC SARS CORONAVIRUS 2 AG -  ED  TROPONIN I (HIGH SENSITIVITY)    EKG EKG Interpretation  Date/Time:  Saturday June 04 2019 23:50:51 EST Ventricular Rate:  103 PR Interval:    QRS Duration: 92 QT Interval:  392 QTC Calculation: 514 R Axis:   71 Text Interpretation: Sinus tachycardia Prolonged QT interval No significant change since last tracing Confirmed by Orpah Greek ([id number]) on 06/05/2019 12:53:43 AM   Radiology DG Chest Port 1 View  Result Date: 06/05/2019 CLINICAL DATA:   Shortness of breath EXAM: PORTABLE CHEST 1 VIEW COMPARISON:  November 27, 2018 FINDINGS: The heart size is enlarged. There is developing pulmonary edema as evidence by small bilateral pleural effusions and Kerley B lines. There is an airspace opacity at the left lung base. There is no pneumothorax. Aortic calcifications are noted. There is no acute osseous abnormality. IMPRESSION: 1. Cardiomegaly with developing pulmonary edema. 2. Opacity at the left lung base may represent atelectasis or a developing infiltrate. 3. Small bilateral pleural effusions. Electronically Signed   By: Constance Holster M.D.   On: 06/05/2019 00:27    Procedures .Critical Care Performed by: Orpah Greek, MD Authorized by: Orpah Greek, MD   Critical care provider statement:    Critical care time (minutes):  35   Critical care time was exclusive of:  Separately billable procedures and treating other patients   Critical care was necessary to treat or prevent imminent or life-threatening deterioration of the following conditions:  Respiratory failure   Critical care was time spent personally by me on the following activities:  Ordering and performing treatments and interventions, development of treatment plan with patient or surrogate, ordering and review of laboratory studies, discussions with consultants, ordering and review of radiographic studies, pulse oximetry, evaluation of patient's response to treatment, re-evaluation of patient's condition, review of old charts and examination of patient   I assumed direction of critical care for this patient from another provider in my specialty: no     (including critical care time)  Medications Ordered in ED Medications - No data to display  ED Course  I have reviewed the triage vital signs and the nursing notes.  Pertinent labs & imaging results that were available during my care of the patient were reviewed by me and considered in my medical decision  making (see chart for details).    MDM Rules/Calculators/A&P                      Patient presents to the emergency department for evaluation of shortness of breath.  Patient reports that she has not been breathing well for the last several days but tonight symptoms worsened.  EMS report that the patient was in moderate to severe respiratory distress with hypoxia upon arrival.    Upon arrival to the ER she was able to be weaned to nasal cannula oxygen.  She is maintaining her oxygen saturations.  First troponin is mildly elevated,  EKG does not show obvious ischemia and no evidence of acute infarct.  Chest x-ray shows bilateral effusions with pulmonary edema.  This is the etiology of the patient's shortness of breath.  She missed dialysis today and is not due for dialysis for another 3 days.  She will therefore require hospitalization for dialysis.  Discussed with Dr. Carolin Sicks, on-call for nephrology.  Will be dialyzed in the morning.  Addendum: patient monitored for a period of time after labs are back.  Patient started having increased symptoms of shortness of breath.  Continues to have rales on exam.  Oxygen saturation is 97% but reporting worsening shortness of breath.  Trial of BiPAP initiated.  Patient reported severe claustrophobia and could not tolerate the BiPAP.  She had been given Ativan to facilitate BiPAP, seems to be somewhat more calm now, tolerating supplemental oxygen off BiPAP.  Final Clinical Impression(s) / ED Diagnoses Final diagnoses:  Acute on chronic congestive heart failure, unspecified heart failure type [facility])    Rx / DC Orders ED Discharge Orders    None       Summerlyn Fickel, Gwenyth Allegra, MD 06/05/19 0115    Orpah Greek, MD 06/05/19 0236

## 2019-06-04 NOTE — ED Triage Notes (Signed)
Pt bib ems, pt been complaining of sob last three days. Pt is a dialysis pt and missed her appt today because the pt was too sick and weak. Pt was very sob and placed on 10L nonrebreather.  80%ra  Hr 109, rr 30, 197/110,  .3mg  epi, 125 solumedrol, 2g mag given per EMS

## 2019-06-05 ENCOUNTER — Emergency Department (HOSPITAL_COMMUNITY): Payer: Medicare Other

## 2019-06-05 DIAGNOSIS — J9601 Acute respiratory failure with hypoxia: Secondary | ICD-10-CM | POA: Diagnosis present

## 2019-06-05 DIAGNOSIS — Z882 Allergy status to sulfonamides status: Secondary | ICD-10-CM | POA: Diagnosis not present

## 2019-06-05 DIAGNOSIS — Z89612 Acquired absence of left leg above knee: Secondary | ICD-10-CM | POA: Diagnosis not present

## 2019-06-05 DIAGNOSIS — J810 Acute pulmonary edema: Secondary | ICD-10-CM | POA: Diagnosis not present

## 2019-06-05 DIAGNOSIS — I5032 Chronic diastolic (congestive) heart failure: Secondary | ICD-10-CM | POA: Diagnosis present

## 2019-06-05 DIAGNOSIS — Z8249 Family history of ischemic heart disease and other diseases of the circulatory system: Secondary | ICD-10-CM | POA: Diagnosis not present

## 2019-06-05 DIAGNOSIS — Z7982 Long term (current) use of aspirin: Secondary | ICD-10-CM | POA: Diagnosis not present

## 2019-06-05 DIAGNOSIS — I160 Hypertensive urgency: Secondary | ICD-10-CM

## 2019-06-05 DIAGNOSIS — Z794 Long term (current) use of insulin: Secondary | ICD-10-CM | POA: Diagnosis not present

## 2019-06-05 DIAGNOSIS — R0603 Acute respiratory distress: Secondary | ICD-10-CM | POA: Diagnosis not present

## 2019-06-05 DIAGNOSIS — I132 Hypertensive heart and chronic kidney disease with heart failure and with stage 5 chronic kidney disease, or end stage renal disease: Secondary | ICD-10-CM | POA: Diagnosis present

## 2019-06-05 DIAGNOSIS — I248 Other forms of acute ischemic heart disease: Secondary | ICD-10-CM | POA: Diagnosis present

## 2019-06-05 DIAGNOSIS — Z888 Allergy status to other drugs, medicaments and biological substances status: Secondary | ICD-10-CM | POA: Diagnosis not present

## 2019-06-05 DIAGNOSIS — M1990 Unspecified osteoarthritis, unspecified site: Secondary | ICD-10-CM | POA: Diagnosis present

## 2019-06-05 DIAGNOSIS — D631 Anemia in chronic kidney disease: Secondary | ICD-10-CM | POA: Diagnosis present

## 2019-06-05 DIAGNOSIS — Z79899 Other long term (current) drug therapy: Secondary | ICD-10-CM | POA: Diagnosis not present

## 2019-06-05 DIAGNOSIS — E1142 Type 2 diabetes mellitus with diabetic polyneuropathy: Secondary | ICD-10-CM | POA: Diagnosis present

## 2019-06-05 DIAGNOSIS — E1122 Type 2 diabetes mellitus with diabetic chronic kidney disease: Secondary | ICD-10-CM | POA: Diagnosis present

## 2019-06-05 DIAGNOSIS — Z20828 Contact with and (suspected) exposure to other viral communicable diseases: Secondary | ICD-10-CM | POA: Diagnosis present

## 2019-06-05 DIAGNOSIS — Z91041 Radiographic dye allergy status: Secondary | ICD-10-CM | POA: Diagnosis not present

## 2019-06-05 DIAGNOSIS — Z96652 Presence of left artificial knee joint: Secondary | ICD-10-CM | POA: Diagnosis present

## 2019-06-05 DIAGNOSIS — Z809 Family history of malignant neoplasm, unspecified: Secondary | ICD-10-CM | POA: Diagnosis not present

## 2019-06-05 DIAGNOSIS — Z992 Dependence on renal dialysis: Secondary | ICD-10-CM | POA: Diagnosis not present

## 2019-06-05 DIAGNOSIS — R9431 Abnormal electrocardiogram [ECG] [EKG]: Secondary | ICD-10-CM

## 2019-06-05 DIAGNOSIS — F17210 Nicotine dependence, cigarettes, uncomplicated: Secondary | ICD-10-CM | POA: Diagnosis present

## 2019-06-05 DIAGNOSIS — Z833 Family history of diabetes mellitus: Secondary | ICD-10-CM | POA: Diagnosis not present

## 2019-06-05 DIAGNOSIS — K219 Gastro-esophageal reflux disease without esophagitis: Secondary | ICD-10-CM | POA: Diagnosis present

## 2019-06-05 DIAGNOSIS — N186 End stage renal disease: Secondary | ICD-10-CM | POA: Diagnosis present

## 2019-06-05 LAB — MRSA PCR SCREENING: MRSA by PCR: NEGATIVE

## 2019-06-05 LAB — GLUCOSE, CAPILLARY
Glucose-Capillary: 140 mg/dL — ABNORMAL HIGH (ref 70–99)
Glucose-Capillary: 145 mg/dL — ABNORMAL HIGH (ref 70–99)
Glucose-Capillary: 119 mg/dL — ABNORMAL HIGH (ref 70–99)
Glucose-Capillary: 153 mg/dL — ABNORMAL HIGH (ref 70–99)

## 2019-06-05 LAB — RESPIRATORY PANEL BY RT PCR (FLU A&B, COVID)
Influenza A by PCR: NEGATIVE
Influenza B by PCR: NEGATIVE
SARS Coronavirus 2 by RT PCR: NEGATIVE

## 2019-06-05 LAB — RENAL FUNCTION PANEL
Albumin: 3.7 g/dL (ref 3.5–5.0)
Anion gap: 13 (ref 5–15)
BUN: 34 mg/dL — ABNORMAL HIGH (ref 8–23)
CO2: 23 mmol/L (ref 22–32)
Calcium: 8.7 mg/dL — ABNORMAL LOW (ref 8.9–10.3)
Chloride: 101 mmol/L (ref 98–111)
Creatinine, Ser: 3.61 mg/dL — ABNORMAL HIGH (ref 0.44–1.00)
GFR calc Af Amer: 15 mL/min — ABNORMAL LOW (ref >60)
GFR calc non Af Amer: 13 mL/min — ABNORMAL LOW (ref >60)
Glucose, Bld: 127 mg/dL — ABNORMAL HIGH (ref 70–99)
Phosphorus: 1.6 mg/dL — ABNORMAL LOW (ref 2.5–4.6)
Potassium: 3.7 mmol/L (ref 3.5–5.1)
Sodium: 137 mmol/L (ref 135–145)

## 2019-06-05 LAB — TROPONIN I (HIGH SENSITIVITY)
Troponin I (High Sensitivity): 34 ng/L — ABNORMAL HIGH (ref <18)
Troponin I (High Sensitivity): 44 ng/L — ABNORMAL HIGH (ref <18)
Troponin I (High Sensitivity): 54 ng/L — ABNORMAL HIGH (ref <18)

## 2019-06-05 LAB — CBC WITH DIFFERENTIAL/PLATELET
Abs Immature Granulocytes: 0.14 10*3/uL — ABNORMAL HIGH (ref 0.00–0.07)
Basophils Absolute: 0.1 10*3/uL (ref 0.0–0.1)
Basophils Relative: 1 %
Eosinophils Absolute: 0 10*3/uL (ref 0.0–0.5)
Eosinophils Relative: 0 %
HCT: 29.8 % — ABNORMAL LOW (ref 36.0–46.0)
Hemoglobin: 9.1 g/dL — ABNORMAL LOW (ref 12.0–15.0)
Immature Granulocytes: 2 %
Lymphocytes Relative: 12 %
Lymphs Abs: 1 10*3/uL (ref 0.7–4.0)
MCH: 32.5 pg (ref 26.0–34.0)
MCHC: 30.5 g/dL (ref 30.0–36.0)
MCV: 106.4 fL — ABNORMAL HIGH (ref 80.0–100.0)
Monocytes Absolute: 0.5 10*3/uL (ref 0.1–1.0)
Monocytes Relative: 6 %
Neutro Abs: 6.3 10*3/uL (ref 1.7–7.7)
Neutrophils Relative %: 79 %
Platelets: 248 10*3/uL (ref 150–400)
RBC: 2.8 MIL/uL — ABNORMAL LOW (ref 3.87–5.11)
RDW: 14.3 % (ref 11.5–15.5)
WBC: 8 10*3/uL (ref 4.0–10.5)
nRBC: 0.5 % — ABNORMAL HIGH (ref 0.0–0.2)

## 2019-06-05 LAB — BASIC METABOLIC PANEL
Anion gap: 15 (ref 5–15)
BUN: 48 mg/dL — ABNORMAL HIGH (ref 8–23)
CO2: 20 mmol/L — ABNORMAL LOW (ref 22–32)
Calcium: 9.8 mg/dL (ref 8.9–10.3)
Chloride: 106 mmol/L (ref 98–111)
Creatinine, Ser: 5.17 mg/dL — ABNORMAL HIGH (ref 0.44–1.00)
GFR calc Af Amer: 9 mL/min — ABNORMAL LOW (ref >60)
GFR calc non Af Amer: 8 mL/min — ABNORMAL LOW (ref >60)
Glucose, Bld: 137 mg/dL — ABNORMAL HIGH (ref 70–99)
Potassium: 4.8 mmol/L (ref 3.5–5.1)
Sodium: 141 mmol/L (ref 135–145)

## 2019-06-05 LAB — I-STAT CHEM 8, ED
BUN: 52 mg/dL — ABNORMAL HIGH (ref 8–23)
Calcium, Ion: 1.15 mmol/L (ref 1.15–1.40)
Chloride: 108 mmol/L (ref 98–111)
Creatinine, Ser: 5 mg/dL — ABNORMAL HIGH (ref 0.44–1.00)
Glucose, Bld: 131 mg/dL — ABNORMAL HIGH (ref 70–99)
HCT: 29 % — ABNORMAL LOW (ref 36.0–46.0)
Hemoglobin: 9.9 g/dL — ABNORMAL LOW (ref 12.0–15.0)
Potassium: 4.6 mmol/L (ref 3.5–5.1)
Sodium: 138 mmol/L (ref 135–145)
TCO2: 22 mmol/L (ref 22–32)

## 2019-06-05 LAB — CBC
HCT: 26.6 % — ABNORMAL LOW (ref 36.0–46.0)
Hemoglobin: 8.5 g/dL — ABNORMAL LOW (ref 12.0–15.0)
MCH: 32 pg (ref 26.0–34.0)
MCHC: 32 g/dL (ref 30.0–36.0)
MCV: 100 fL (ref 80.0–100.0)
Platelets: 245 10*3/uL (ref 150–400)
RBC: 2.66 MIL/uL — ABNORMAL LOW (ref 3.87–5.11)
RDW: 14.4 % (ref 11.5–15.5)
WBC: 9.9 10*3/uL (ref 4.0–10.5)
nRBC: 0.5 % — ABNORMAL HIGH (ref 0.0–0.2)

## 2019-06-05 LAB — POC SARS CORONAVIRUS 2 AG -  ED: SARS Coronavirus 2 Ag: NEGATIVE

## 2019-06-05 LAB — MAGNESIUM: Magnesium: 2.7 mg/dL — ABNORMAL HIGH (ref 1.7–2.4)

## 2019-06-05 LAB — PROCALCITONIN: Procalcitonin: 0.28 ng/mL

## 2019-06-05 LAB — HIV ANTIBODY (ROUTINE TESTING W REFLEX): HIV Screen 4th Generation wRfx: NONREACTIVE

## 2019-06-05 MED ORDER — HEPARIN SODIUM (PORCINE) 1000 UNIT/ML DIALYSIS: 1000.0000 [IU] | INTRAMUSCULAR | Status: DC | PRN

## 2019-06-05 MED ORDER — SODIUM CHLORIDE 0.9 % IV SOLN: 100.0000 mL | INTRAVENOUS | Status: DC | PRN

## 2019-06-05 MED ORDER — INSULIN ASPART 100 UNIT/ML ~~LOC~~ SOLN: 0.0000 [IU] | Freq: Three times a day (TID) | SUBCUTANEOUS | Status: DC

## 2019-06-05 MED ORDER — SODIUM CHLORIDE 0.9 % IV SOLN
125.0000 mg | Freq: Once | INTRAVENOUS | Status: AC
  Administered 2019-06-05: 125 mg via INTRAVENOUS
  Filled 2019-06-05: qty 10

## 2019-06-05 MED ORDER — HYDRALAZINE HCL 50 MG PO TABS
100.0000 mg | ORAL_TABLET | ORAL | Status: DC
  Administered 2019-06-05 – 2019-06-06 (×3): 100 mg via ORAL
  Filled 2019-06-05 (×3): qty 2

## 2019-06-05 MED ORDER — PENTAFLUOROPROP-TETRAFLUOROETH EX AERO: 1.0000 "application " | INHALATION_SPRAY | CUTANEOUS | Status: DC | PRN

## 2019-06-05 MED ORDER — ACETAMINOPHEN 325 MG PO TABS
650.0000 mg | ORAL_TABLET | Freq: Four times a day (QID) | ORAL | Status: DC | PRN
  Administered 2019-06-05 – 2019-06-06 (×3): 650 mg via ORAL
  Filled 2019-06-05 (×3): qty 2

## 2019-06-05 MED ORDER — HEPARIN SODIUM (PORCINE) 5000 UNIT/ML IJ SOLN
5000.0000 [IU] | Freq: Three times a day (TID) | INTRAMUSCULAR | Status: DC
  Filled 2019-06-05: qty 1

## 2019-06-05 MED ORDER — HYDRALAZINE HCL 20 MG/ML IJ SOLN
5.0000 mg | INTRAMUSCULAR | Status: DC | PRN
  Filled 2019-06-05: qty 1

## 2019-06-05 MED ORDER — LORAZEPAM 2 MG/ML IJ SOLN
1.0000 mg | Freq: Once | INTRAMUSCULAR | Status: AC
  Administered 2019-06-05: 1 mg via INTRAVENOUS
  Filled 2019-06-05: qty 1

## 2019-06-05 MED ORDER — CHLORHEXIDINE GLUCONATE CLOTH 2 % EX PADS
6.0000 | MEDICATED_PAD | Freq: Every day | CUTANEOUS | Status: DC
  Administered 2019-06-05 – 2019-06-06 (×2): 6 via TOPICAL

## 2019-06-05 MED ORDER — ATORVASTATIN CALCIUM 10 MG PO TABS
10.0000 mg | ORAL_TABLET | Freq: Every day | ORAL | Status: DC
  Administered 2019-06-05: 10 mg via ORAL
  Filled 2019-06-05: qty 1

## 2019-06-05 MED ORDER — GABAPENTIN 100 MG PO CAPS
100.0000 mg | ORAL_CAPSULE | Freq: Three times a day (TID) | ORAL | Status: DC
  Administered 2019-06-05 – 2019-06-06 (×3): 100 mg via ORAL
  Filled 2019-06-05 (×3): qty 1

## 2019-06-05 MED ORDER — CLONIDINE HCL 0.2 MG PO TABS
0.2000 mg | ORAL_TABLET | Freq: Two times a day (BID) | ORAL | Status: DC
  Administered 2019-06-05 – 2019-06-06 (×2): 0.2 mg via ORAL
  Filled 2019-06-05 (×2): qty 1

## 2019-06-05 MED ORDER — GABAPENTIN 100 MG PO CAPS: 200.0000 mg | ORAL_CAPSULE | Freq: Three times a day (TID) | ORAL | Status: DC

## 2019-06-05 MED ORDER — CLONIDINE HCL 0.2 MG PO TABS: 0.2000 mg | ORAL_TABLET | Freq: Two times a day (BID) | ORAL | Status: DC

## 2019-06-05 MED ORDER — ACETAMINOPHEN 650 MG RE SUPP: 650.0000 mg | Freq: Four times a day (QID) | RECTAL | Status: DC | PRN

## 2019-06-05 MED ORDER — INSULIN ASPART 100 UNIT/ML ~~LOC~~ SOLN: 0.0000 [IU] | Freq: Every day | SUBCUTANEOUS | Status: DC

## 2019-06-05 MED ORDER — LIDOCAINE HCL (PF) 1 % IJ SOLN: 5.0000 mL | INTRAMUSCULAR | Status: DC | PRN

## 2019-06-05 MED ORDER — LIDOCAINE-PRILOCAINE 2.5-2.5 % EX CREA: 1.0000 "application " | TOPICAL_CREAM | CUTANEOUS | Status: DC | PRN

## 2019-06-05 MED ORDER — NITROGLYCERIN 0.4 MG SL SUBL
SUBLINGUAL_TABLET | SUBLINGUAL | Status: AC
  Filled 2019-06-05: qty 1

## 2019-06-05 MED ORDER — AMLODIPINE BESYLATE 10 MG PO TABS
10.0000 mg | ORAL_TABLET | Freq: Every day | ORAL | Status: DC
  Administered 2019-06-05: 10 mg via ORAL
  Filled 2019-06-05: qty 1

## 2019-06-05 MED ORDER — ALTEPLASE 2 MG IJ SOLR: 2.0000 mg | Freq: Once | INTRAMUSCULAR | Status: DC | PRN

## 2019-06-05 NOTE — Progress Notes (Signed)
Patient c/o chest pain with cough and  deep breath. 12 lead EKG done, VSS, MD notified no new order at this time. Patient awaiting to go to dialysis today. 2 tylenol given, patient resting comfortable now will continue to monitor.

## 2019-06-05 NOTE — ED Notes (Signed)
Pt taken off NRB, placed on 2LNC. Sats maintaining >97%

## 2019-06-05 NOTE — Progress Notes (Signed)
[name], female   DOB: 02/10/1956, 63 y.o.   MRN: [id number] Patient was admitted early this morning for worsening shortness of breath due to missed hemodialysis.  Nephrology has been consulted.  Patient seen and examined at bedside and plan of care discussed with her.  I have reviewed patient's medical records including this morning H&P, her labs, vitals and medications myself.  Nephrology is planning to do dialysis today.  Repeat a.m. labs.

## 2019-06-05 NOTE — Progress Notes (Signed)
Assigned RN informs CN of pt with c/o chest pain. EKG performed. VS obtained, all charted.    Page to MD.  Brandy Houston pt c/o CP, found to be reproducible, exacerbated by cough/deep palpation. EKG done, VS unchanged.   Upon MD callback, no further interventions necessary.

## 2019-06-05 NOTE — ED Notes (Signed)
Breakfast ordered 

## 2019-06-05 NOTE — Progress Notes (Signed)
Placed pt. On bipap per MD. 

## 2019-06-05 NOTE — H&P (Signed)
History and Physical    Brandy Houston DOB: [birth year] DOA: 06/04/2019  PCP: [name], MD Patient coming from: Home  Chief Complaint: Respiratory distress  HPI: Brandy Houston is a 63 y.o. female with medical history significant of ESRD on HD TTS, chronic diastolic congestive heart failure, hypertension, hyperlipidemia, insulin-dependent diabetes mellitus, and conditions listed below presenting to the ED via EMS for evaluation of respiratory distress. Patient states she missed dialysis on Saturday.  She then woke up tonight from her sleep to go to the bathroom but felt extremely short of breath.  She has also been coughing.  Denies fevers or chills.  Denies chest pain.  States her son was diagnosed with Covid 3 weeks ago.  She has no other complaints.  ED Course: Oxygen saturation 80% on room air, placed on 10 L supplemental oxygen by EMS.  She was also given 0.3 mg IV, 125 mg Solu-Medrol, and 2 g mag by EMS.  Patient was initially weaned to nasal cannula in the ED but later tried on BiPAP due to complaints of worsening shortness of breath but was not able to tolerate it.  Tachycardic and tachypneic.  Afebrile and no leukocytosis.  SARS-CoV-2 antigen and PCR test negative.  Hemoglobin 9.1, at baseline.  Potassium 4.8, bicarb 20.  Blood pressure elevated with systolic in the 704U.  High-sensitivity troponin mildly elevated at 34.  EKG not suggestive of ACS.  Chest x-ray showing pulmonary edema and small bilateral pleural effusions; opacity at the left lung base.  Nephrology has seen the patient and is planning on taking her for dialysis early in the morning.  Review of Systems:  All systems reviewed and apart from history of presenting illness, are negative.  Past Medical History:  Diagnosis Date  . Anemia   . Anxiety   . Arthritis    "in my joints" (03/10/2017)  . Chronic diastolic CHF (congestive heart failure) ([place])   . CKD (chronic kidney disease), stage IV ([place])    stage IV. previous HD, none currently 10/30/15 (confirmed 02/05/2017 & 03/10/2017)  . Depression    Chronic  . Diabetic peripheral neuropathy ([place])   . Dysrhythmia    tachycardia, normal ECHO 08-09-14  . GERD (gastroesophageal reflux disease)   . Heart murmur     dx'd 02/05/2017  . Hepatitis C    "tx'd in 2016; I'm negative now" (02/05/2017)  . History of blood transfusion 2017   "w/knee replacement"  . Hypertension   . Osteoarthritis of left knee   . Peripheral neuropathy   . Protein calorie malnutrition ([place])   . Septic arthritis ([place])   . Slow transit constipation   . Type II diabetes mellitus ([name])    IDDM  . Uncontrolled hypertension 02/18/2013  . Unsteady gait     Past Surgical History:  Procedure Laterality Date  . A/V FISTULAGRAM Right 07/07/2018   Procedure: A/V FISTULAGRAM;  Surgeon: Marty Heck, MD;  Location: [facility];  Service: Cardiovascular;  Laterality: Right;  . AMPUTATION Left 03/20/2017   Procedure: LEFT ABOVE KNEE AMPUTATION;  Surgeon: Newt Minion, MD;  Location: [facility];  Service: Orthopedics;  Laterality: Left;  . APPENDECTOMY    . AV FISTULA PLACEMENT Left 09/13/2014   Procedure: Brachial Artery to Brachial Vein Gortex Four - Seven Stretch GRAFT INSERTION Left Forearm;  Surgeon: Mal Misty, MD;  Location: [facility];  Service: Vascular;  Laterality: Left;  . [place] Right 10/26/2017   Procedure: BASILIC  VEIN TRANSPOSITION FIRST STAGE RIGHT ARM;  Surgeon: Conrad Snowville, MD;  Location: [facility];  Service: Vascular;  Laterality: Right;  . BASCILIC VEIN TRANSPOSITION Right 02/01/2018   Procedure: SECOND STAGE BASILIC VEIN TRANSPOSITION RIGHT UPPER EXTREMITY;  Surgeon: Marty Heck, MD;  Location: [place] [facility];  Service: Vascular;  Laterality: Right;  . CHOLECYSTECTOMY OPEN    . COLON SURGERY    . EXCISIONAL TOTAL KNEE ARTHROPLASTY WITH ANTIBIOTIC SPACERS Left 02/06/2017   Procedure: Incisional total iknee with antibiotic spacer ;   Surgeon: Leandrew Koyanagi, MD;  Location: [facility];  Service: Orthopedics;  Laterality: Left;  . IR FLUORO GUIDE CV LINE RIGHT  02/10/2017  . IR REMOVAL TUN CV CATH W/O FL  04/01/2017  . IR US GUIDE VASC ACCESS RIGHT  02/10/2017  . IRRIGATION AND DEBRIDEMENT KNEE Left 03/12/2017   Procedure: IRRIGATION AND DEBRIDEMENT LEFT KNEE WITH WOUND VAC APPLICATION;  Surgeon: Leandrew Koyanagi, MD;  Location: [place];  Service: Orthopedics;  Laterality: Left;  . JOINT REPLACEMENT    . KNEE ARTHROSCOPY Right 08/10/2014   Procedure: ARTHROSCOPY I & D KNEE;  Surgeon: Marianna Payment, MD;  Location: [facility];  Service: Orthopedics;  Laterality: Right;  . KNEE ARTHROSCOPY Left 08/11/2014   Procedure: ARTHROSCOPIC WASHOUT LEFT KNEE;  Surgeon: Marianna Payment, MD;  Location: [facility];  Service: Orthopedics;  Laterality: Left;  . KNEE ARTHROSCOPY Left 08/19/2014   Procedure: ARTHROSCOPIC WASHOUT LEFT KNEE;  Surgeon: Leandrew Koyanagi, MD;  Location: [place];  Service: Orthopedics;  Laterality: Left;  . KNEE ARTHROSCOPY WITH LATERAL MENISECTOMY Left 04/04/2015   Procedure: AND PARTIAL LATERAL MENISECTOMY;  Surgeon: Leandrew Koyanagi, MD;  Location: [place];  Service: Orthopedics;  Laterality: Left;  . KNEE ARTHROSCOPY WITH MEDIAL MENISECTOMY Left 04/04/2015   Procedure: LEFT KNEE ARTHROSCOPY WITH PARTIAL MEDIAL MENISCECTOMY  AND SYNOVECTOMY;  Surgeon: Leandrew Koyanagi, MD;  Location: [place];  Service: Orthopedics;  Laterality: Left;  . PERIPHERAL VASCULAR BALLOON ANGIOPLASTY  07/07/2018   Procedure: PERIPHERAL VASCULAR BALLOON ANGIOPLASTY;  Surgeon: Marty Heck, MD;  Location: [facility];  Service: Cardiovascular;;  right AV fistula  . SHOULDER ARTHROSCOPY Bilateral 08/10/2014   Procedure: I & D BILATERAL SHOULDERS ;  Surgeon: Marianna Payment, MD;  Location: [facility];  Service: Orthopedics;  Laterality: Bilateral;  . SMALL INTESTINE SURGERY     Due to Small Bowel Obstruction; "fixed it when they  did my gallbladder OR"  . TEE WITHOUT CARDIOVERSION N/A 08/14/2014   Procedure: TRANSESOPHAGEAL ECHOCARDIOGRAM (TEE);  Surgeon: Thayer Headings, MD;  Location: [facility];  Service: Cardiovascular;  Laterality: N/A;  . TENOSYNOVECTOMY Right 08/11/2014   Procedure: RIGHT WRIST IRRIGATION AND DEBRIDEMENT, TENOSYNOVECTOMY;  Surgeon: Marianna Payment, MD;  Location: [facility];  Service: Orthopedics;  Laterality: Right;  . TOTAL KNEE ARTHROPLASTY Left 11/07/2015   Procedure: LEFT TOTAL KNEE ARTHROPLASTY WITH REVISION OF IMPLANTS;  Surgeon: Leandrew Koyanagi, MD;  Location: [facility];  Service: Orthopedics;  Laterality: Left;  . TUBAL LIGATION       reports that she has been smoking cigarettes. She has a 17.50 pack-year smoking history. She has never used smokeless tobacco. She reports previous alcohol use. She reports that she does not use drugs.  Allergies  Allergen Reactions  . Compazine [Prochlorperazine] Shortness Of Breath, Swelling and Other (See Comments)    TONGUE SWELLS  . Shellfish-Derived Products Anaphylaxis  . Iodinated Diagnostic Agents Hives and Rash  .  Omnipaque [Iohexol] Hives  . Tape     Brown paper tape caused skin irritation  . Sulfa Antibiotics Rash    Family History  Problem Relation Age of Onset  . Cancer Mother   . Heart disease Father   . Cancer Sister   . Hypertension Sister   . Cancer Brother   . Diabetes Brother   . Hypertension Brother     Prior to Admission medications   Medication Sig Start Date End Date Taking? Authorizing Provider  Accu-Chek Softclix Lancets lancets Check sugars three times a day for E11.65 09/20/18   [name], MD  amLODipine (NORVASC) 10 MG tablet Take 1 tablet (10 mg total) by mouth at bedtime. 03/31/17   Angiulli, Lavon Paganini, PA-C  aspirin EC 81 MG tablet Take 81 mg by mouth daily.    [provider]  atorvastatin (LIPITOR) 10 MG tablet TAKE 1 TABLET BY MOUTH AT BEDTIME 02/14/19   [name], MD  Blood Glucose Monitoring  Suppl (ACCU-CHEK AVIVA PLUS) W/DEVICE KIT Check sugars TID for E11.65 01/29/15   Chari Manning A, NP  calcium acetate (PHOSLO) 667 MG capsule Take 2 capsules by mouth 3 (three) times daily. 01/13/19   [provider]  Chlorpheniramine Maleate (ALLERGY PO) Take 1 tablet by mouth daily as needed (allergies).    [provider]  cinacalcet (SENSIPAR) 30 MG tablet Take by mouth. 12/11/18 12/10/19  [provider]  cloNIDine (CATAPRES) 0.2 MG tablet Take 0.2 mg by mouth 2 (two) times daily.    [provider]  gabapentin (NEURONTIN) 100 MG capsule Take 200 mg by mouth 3 (three) times daily. 11/01/18   [provider]  glucose blood (ACCU-CHEK AVIVA) test strip Check sugars TID for E11.65 07/21/18   [name], MD  hydrALAZINE (APRESOLINE) 100 MG tablet Take 100 mg by mouth as directed. Take 1 tablet TID ONLY ON (SUN MON, WED, FRI)    [provider]  Insulin Glargine (LANTUS SOLOSTAR) 100 UNIT/ML Solostar Pen Inject 10 Units into the skin daily. 02/28/19   [name], MD  Lancet Devices [facility]) lancets Check sugars TID for E11.65 01/29/15   Chari Manning A, NP  lidocaine-prilocaine (EMLA) cream Apply 1 application topically once.  10/25/18   [provider]  omeprazole (PRILOSEC) 20 MG capsule Take 20 mg by mouth daily before breakfast.  10/04/15   [provider]  polyethylene glycol (MIRALAX / GLYCOLAX) 17 g packet Take 17 g by mouth daily as needed for moderate constipation. 12/10/18   [name], MD  predniSONE (DELTASONE) 10 MG tablet Take 1 tablet (10 mg total) by mouth daily with breakfast. 05/25/19   Persons, Bevely Palmer, PA  sertraline (ZOLOFT) 100 MG tablet Take 1 tablet (100 mg total) by mouth daily. 11/05/18   [name], MD  sevelamer carbonate (RENVELA) 800 MG tablet Take 1,600 mg by mouth 3 (three) times daily with meals.    [provider]  traZODone (DESYREL) 50 MG tablet Take 50 mg by mouth  at bedtime. 10/27/18   [provider]  VITAMIN D PO Take by mouth. 09/30/18 09/29/19  [provider]    Physical Exam: Vitals:   06/05/19 0218 06/05/19 0230 06/05/19 0239 06/05/19 0300  BP:  (!) 182/88  (!) 179/99  Pulse: (!) 115  (!) 106 (!) 104  Resp: (!) 29  (!) 21 (!) 21  Temp:      TempSrc:  SpO2: 100%  99% 100%  Weight:      Height:        Physical Exam  Constitutional: She is oriented to person, place, and time. She appears well-developed and well-nourished.  HENT:  Head: Normocephalic.  Eyes: Right eye exhibits no discharge. Left eye exhibits no discharge.  Cardiovascular: Regular rhythm and intact distal pulses.  Slightly tachycardic  Pulmonary/Chest: She is in respiratory distress. She has no wheezes.  Rales at the bases On supplemental oxygen Slightly tachypneic Able to speak clearly in full sentences  Abdominal: Soft. Bowel sounds are normal. She exhibits no distension. There is no abdominal tenderness. There is no guarding.  Musculoskeletal:        General: No edema.     Cervical back: Neck supple.     Comments: Left lower extremity AKA  Neurological: She is alert and oriented to person, place, and time.  Skin: Skin is warm and dry. She is not diaphoretic.     Labs on Admission: I have personally reviewed following labs and imaging studies  CBC: Recent Labs  Lab 06/04/19 2339 06/05/19 0008  WBC 8.0  --   NEUTROABS 6.3  --   HGB 9.1* 9.9*  HCT 29.8* 29.0*  MCV 106.4*  --   PLT 248  --    Basic Metabolic Panel: Recent Labs  Lab 06/04/19 2339 06/05/19 0008  NA 141 138  K 4.8 4.6  CL 106 108  CO2 20*  --   GLUCOSE 137* 131*  BUN 48* 52*  CREATININE 5.17* 5.00*  CALCIUM 9.8  --    GFR: Estimated Creatinine Clearance: 10.9 mL/min (A) (by C-G formula based on SCr of 5 mg/dL (H)). Liver Function Tests: No results for input(s): AST, ALT, ALKPHOS, BILITOT, PROT, ALBUMIN in the last 168 hours. No results for input(s):  LIPASE, AMYLASE in the last 168 hours. No results for input(s): AMMONIA in the last 168 hours. Coagulation Profile: No results for input(s): INR, PROTIME in the last 168 hours. Cardiac Enzymes: No results for input(s): CKTOTAL, CKMB, CKMBINDEX, TROPONINI in the last 168 hours. BNP (last 3 results) No results for input(s): PROBNP in the last 8760 hours. HbA1C: No results for input(s): HGBA1C in the last 72 hours. CBG: No results for input(s): GLUCAP in the last 168 hours. Lipid Profile: No results for input(s): CHOL, HDL, LDLCALC, TRIG, CHOLHDL, LDLDIRECT in the last 72 hours. Thyroid Function Tests: No results for input(s): TSH, T4TOTAL, FREET4, T3FREE, THYROIDAB in the last 72 hours. Anemia Panel: No results for input(s): VITAMINB12, FOLATE, FERRITIN, TIBC, IRON, RETICCTPCT in the last 72 hours. Urine analysis:    Component Value Date/Time   COLORURINE AMBER (A) 02/08/2017 1138   APPEARANCEUR TURBID (A) 02/08/2017 1138   LABSPEC 1.014 02/08/2017 1138   PHURINE 5.0 02/08/2017 1138   GLUCOSEU NEGATIVE 02/08/2017 1138   HGBUR MODERATE (A) 02/08/2017 1138   BILIRUBINUR NEGATIVE 02/08/2017 1138   KETONESUR NEGATIVE 02/08/2017 1138   PROTEINUR 100 (A) 02/08/2017 1138   UROBILINOGEN 0.2 09/02/2014 1504   NITRITE NEGATIVE 02/08/2017 1138   LEUKOCYTESUR LARGE (A) 02/08/2017 1138    Radiological Exams on Admission: DG Chest Port 1 View  Result Date: 06/05/2019 CLINICAL DATA:  Shortness of breath EXAM: PORTABLE CHEST 1 VIEW COMPARISON:  November 27, 2018 FINDINGS: The heart size is enlarged. There is developing pulmonary edema as evidence by small bilateral pleural effusions and Kerley B lines. There is an airspace opacity at the left lung base. There is no pneumothorax. Aortic  calcifications are noted. There is no acute osseous abnormality. IMPRESSION: 1. Cardiomegaly with developing pulmonary edema. 2. Opacity at the left lung base may represent atelectasis or a developing infiltrate. 3.  Small bilateral pleural effusions. Electronically Signed   By: Constance Holster M.D.   On: 06/05/2019 00:27    EKG: Independently reviewed.  Sinus tachycardia, heart rate 103.  Baseline wander in V2.  QTc 514.  Assessment/Plan Principal Problem:   Acute respiratory failure with hypoxia ([name]) Active Problems:   ESRD (end stage renal disease) on dialysis ([place])   Acute pulmonary edema ([name])   Hypertensive urgency   QT prolongation   Acute hypoxic respiratory failure secondary to volume overload/acute pulmonary edema in the setting of missed hemodialysis Oxygen saturation 80% on room air.  She was initially on nonrebreather by EMS but was weaned to supplemental oxygen via nasal cannula in the ED.  She maintain good oxygen saturation on 2 L supplemental oxygen but complained of shortness of breath.  BiPAP was attempted but patient could not tolerate it.  She is currently on nonrebreather for comfort.  Slightly tachypneic but able to speak clearly in full sentences.  Chest x-ray showing pulmonary edema and small bilateral pleural effusions. -Nephrology is on board, planning on taking her for dialysis early this morning -Continue supplemental oxygen to keep oxygen saturation above 92% -Question of opacity at the left lung base on chest x-ray.  SARS-CoV-2 antigen and PCR test negative.  Bacteria pneumonia less likely given no fever or leukocytosis.  Check procalcitonin level.  ESRD on HD TTS Missed dialysis on Saturday.  Presenting with signs of volume overload/pulmonary edema.  Potassium 4.8, bicarb 20. -Nephrology following, dialysis this morning -Continue to monitor renal function panel  Hypertensive urgency Blood pressure elevated with systolic in the 544B due to volume overload/missed hemodialysis. -Hydralazine as needed for SBP greater than 180  Demand ischemia Patient denies chest pain and appears comfortable on exam.  High-sensitivity troponin mildly elevated at 34, repeat 44.  EKG  not suggestive of ACS.  Suspect mild troponin elevation is due to demand ischemia in the setting of volume overload/ESRD on HD. -Cardiac monitoring -Continue to trend troponin  Insulin-dependent diabetes mellitus -Very sensitive sliding scale insulin and CBG checks  QT prolongation on EKG -Cardiac monitoring -Monitor potassium and magnesium levels -Avoid QT prolonging drugs if possible -Repeat EKG in a.m.  Pharmacy med rec pending.  DVT prophylaxis: Subcutaneous heparin Code Status: Full code.  Discussed with the patient. Family Communication: No family available at this time. Disposition Plan: Anticipate discharge after clinical improvement. Consults called: Nephrology Admission status: It is my clinical opinion that admission to INPATIENT is reasonable and necessary in this 63 y.o. female presenting with acute hypoxic respiratory failure secondary to pulmonary edema in the setting of missed hemodialysis.  Will need serial hemodialysis.  Given the aforementioned, the predictability of an adverse outcome is felt to be significant. I expect that the patient will require at least 2 midnights in the hospital to treat this condition.   The medical decision making on this patient was of high complexity and the patient is at high risk for clinical deterioration, therefore this is a level 3 visit.  Shela Leff MD [facility] Pager [phone]  If 7PM-7AM, please contact night-coverage [email or web address] Password [facility]  06/05/2019, 3:48 AM

## 2019-06-05 NOTE — Consult Note (Signed)
[facility]: Reason for Consult: To manage dialysis and dialysis related needs Referring Physician: Dr Betsey Holiday, [place]  HPI:  Brandy Houston is an 63 y.o. female with history of HTN, DM, anemia, ESRD on HD TTS at [facility] presented with shortness of breath worsening for 3 days seen as a consultation at the request of Dr Betsey Holiday.  Patient missed her HD yesterday.  On arrival she was very short of breath, oxygen saturation 80% on room air and required nonrebreather oxygen.  She received epi and Solu-Medrol by EMS.  Felt somewhat better and currently acceptable saturation in 2 L of oxygen.  Lab consistent with potassium of 4.6.  Hemoglobin 9.9.  Chest x-ray with acute pulmonary edema. We are consulted to manage pulmonary edema in ESRD patient.  Patient reports short of breath, weak and chest heaviness.  Denies nausea vomiting, fever or chills. OP dialysis: Dialyzes at [facility]  EDW 59.5 kg. 4 hr HD Bath 2K 2 ca, Dialyzer 180 NR, Heparin 2100. Access R UE AVF Mircera 100 mcg on 12/10 Venofer 100 mg each tx.  Past Medical History:  Diagnosis Date  . Anemia   . Anxiety   . Arthritis    "in my joints" (03/10/2017)  . Chronic diastolic CHF (congestive heart failure) ([place])   . CKD (chronic kidney disease), stage IV ([place])    stage IV. previous HD, none currently 10/30/15 (confirmed 02/05/2017 & 03/10/2017)  . Depression    Chronic  . Diabetic peripheral neuropathy ([place])   . Dysrhythmia    tachycardia, normal ECHO 08-09-14  . GERD (gastroesophageal reflux disease)   . Heart murmur     dx'd 02/05/2017  . Hepatitis C    "tx'd in 2016; I'm negative now" (02/05/2017)  . History of blood transfusion 2017   "w/knee replacement"  . Hypertension   . Osteoarthritis of left knee   . Peripheral neuropathy   . Protein calorie malnutrition ([name])   . Septic arthritis ([place])   . Slow transit constipation   . Type II diabetes mellitus ([name])    IDDM  . Uncontrolled hypertension  02/18/2013  . Unsteady gait     Past Surgical History:  Procedure Laterality Date  . A/V FISTULAGRAM Right 07/07/2018   Procedure: A/V FISTULAGRAM;  Surgeon: Marty Heck, MD;  Location: [place] [facility];  Service: Cardiovascular;  Laterality: Right;  . AMPUTATION Left 03/20/2017   Procedure: LEFT ABOVE KNEE AMPUTATION;  Surgeon: Newt Minion, MD;  Location: [place];  Service: Orthopedics;  Laterality: Left;  . APPENDECTOMY    . AV FISTULA PLACEMENT Left 09/13/2014   Procedure: Brachial Artery to Brachial Vein Gortex Four - Seven Stretch GRAFT INSERTION Left Forearm;  Surgeon: Mal Misty, MD;  Location: [place];  Service: Vascular;  Laterality: Left;  . BASCILIC VEIN TRANSPOSITION Right 10/26/2017   Procedure: BASILIC VEIN TRANSPOSITION FIRST STAGE RIGHT ARM;  Surgeon: Conrad [place], MD;  Location: [place];  Service: Vascular;  Laterality: Right;  . BASCILIC VEIN TRANSPOSITION Right 02/01/2018   Procedure: SECOND STAGE BASILIC VEIN TRANSPOSITION RIGHT UPPER EXTREMITY;  Surgeon: Marty Heck, MD;  Location: [place];  Service: Vascular;  Laterality: Right;  . CHOLECYSTECTOMY OPEN    . COLON SURGERY    . EXCISIONAL TOTAL KNEE ARTHROPLASTY WITH ANTIBIOTIC SPACERS Left 02/06/2017   Procedure: Incisional total iknee with antibiotic spacer ;  Surgeon: Leandrew Koyanagi, MD;  Location: [place];  Service: Orthopedics;  Laterality: Left;  . IR FLUORO  GUIDE CV LINE RIGHT  02/10/2017  . IR REMOVAL TUN CV CATH W/O FL  04/01/2017  . IR US GUIDE VASC ACCESS RIGHT  02/10/2017  . IRRIGATION AND DEBRIDEMENT KNEE Left 03/12/2017   Procedure: IRRIGATION AND DEBRIDEMENT LEFT KNEE WITH WOUND VAC APPLICATION;  Surgeon: Leandrew Koyanagi, MD;  Location: [place];  Service: Orthopedics;  Laterality: Left;  . JOINT REPLACEMENT    . KNEE ARTHROSCOPY Right 08/10/2014   Procedure: ARTHROSCOPY I & D KNEE;  Surgeon: Marianna Payment, MD;  Location: [facility];  Service: Orthopedics;  Laterality: Right;  . KNEE ARTHROSCOPY Left  08/11/2014   Procedure: ARTHROSCOPIC WASHOUT LEFT KNEE;  Surgeon: Marianna Payment, MD;  Location: [place];  Service: Orthopedics;  Laterality: Left;  . KNEE ARTHROSCOPY Left 08/19/2014   Procedure: ARTHROSCOPIC WASHOUT LEFT KNEE;  Surgeon: Leandrew Koyanagi, MD;  Location: [place];  Service: Orthopedics;  Laterality: Left;  . KNEE ARTHROSCOPY WITH LATERAL MENISECTOMY Left 04/04/2015   Procedure: AND PARTIAL LATERAL MENISECTOMY;  Surgeon: Leandrew Koyanagi, MD;  Location: [place];  Service: Orthopedics;  Laterality: Left;  . KNEE ARTHROSCOPY WITH MEDIAL MENISECTOMY Left 04/04/2015   Procedure: LEFT KNEE ARTHROSCOPY WITH PARTIAL MEDIAL MENISCECTOMY  AND SYNOVECTOMY;  Surgeon: Leandrew Koyanagi, MD;  Location: [place];  Service: Orthopedics;  Laterality: Left;  . PERIPHERAL VASCULAR BALLOON ANGIOPLASTY  07/07/2018   Procedure: PERIPHERAL VASCULAR BALLOON ANGIOPLASTY;  Surgeon: Marty Heck, MD;  Location: [place] [facility];  Service: Cardiovascular;;  right AV fistula  . SHOULDER ARTHROSCOPY Bilateral 08/10/2014   Procedure: I & D BILATERAL SHOULDERS ;  Surgeon: Marianna Payment, MD;  Location: [facility];  Service: Orthopedics;  Laterality: Bilateral;  . SMALL INTESTINE SURGERY     Due to Small Bowel Obstruction; "fixed it when they did my gallbladder OR"  . TEE WITHOUT CARDIOVERSION N/A 08/14/2014   Procedure: TRANSESOPHAGEAL ECHOCARDIOGRAM (TEE);  Surgeon: Thayer Headings, MD;  Location: [place];  Service: Cardiovascular;  Laterality: N/A;  . TENOSYNOVECTOMY Right 08/11/2014   Procedure: RIGHT WRIST IRRIGATION AND DEBRIDEMENT, TENOSYNOVECTOMY;  Surgeon: Marianna Payment, MD;  Location: [place];  Service: Orthopedics;  Laterality: Right;  . TOTAL KNEE ARTHROPLASTY Left 11/07/2015   Procedure: LEFT TOTAL KNEE ARTHROPLASTY WITH REVISION OF IMPLANTS;  Surgeon: Leandrew Koyanagi, MD;  Location: [place];  Service: Orthopedics;  Laterality: Left;  . TUBAL LIGATION      Family History   Problem Relation Age of Onset  . Cancer Mother   . Heart disease Father   . Cancer Sister   . Hypertension Sister   . Cancer Brother   . Diabetes Brother   . Hypertension Brother     Social History:  reports that she has been smoking cigarettes. She has a 17.50 pack-year smoking history. She has never used smokeless tobacco. She reports previous alcohol use. She reports that she does not use drugs.  Allergies:  Allergies  Allergen Reactions  . Compazine [Prochlorperazine] Shortness Of Breath, Swelling and Other (See Comments)    TONGUE SWELLS  . Shellfish-Derived Products Anaphylaxis  . Iodinated Diagnostic Agents Hives and Rash  . Omnipaque [Iohexol] Hives  . Tape     Brown paper tape caused skin irritation  . Sulfa Antibiotics Rash    Medications: I have reviewed the patient's current medications.   Results for orders placed or performed during the hospital encounter of 06/04/19 (from the past 48 hour(s))  CBC with Differential  Status: Abnormal   Collection Time: 06/04/19 11:39 PM  Result Value Ref Range   WBC 8.0 4.0 - 10.5 K/uL   RBC 2.80 (L) 3.87 - 5.11 MIL/uL   Hemoglobin 9.1 (L) 12.0 - 15.0 g/dL   HCT 29.8 (L) 36.0 - 46.0 %   MCV 106.4 (H) 80.0 - 100.0 fL   MCH 32.5 26.0 - 34.0 pg   MCHC 30.5 30.0 - 36.0 g/dL   RDW 14.3 11.5 - 15.5 %   Platelets 248 150 - 400 K/uL   nRBC 0.5 (H) 0.0 - 0.2 %   Neutrophils Relative % 79 %   Neutro Abs 6.3 1.7 - 7.7 K/uL   Lymphocytes Relative 12 %   Lymphs Abs 1.0 0.7 - 4.0 K/uL   Monocytes Relative 6 %   Monocytes Absolute 0.5 0.1 - 1.0 K/uL   Eosinophils Relative 0 %   Eosinophils Absolute 0.0 0.0 - 0.5 K/uL   Basophils Relative 1 %   Basophils Absolute 0.1 0.0 - 0.1 K/uL   Immature Granulocytes 2 %   Abs Immature Granulocytes 0.14 (H) 0.00 - 0.07 K/uL    Comment: Performed at [facility], [address] [place], [place] [address]  Basic metabolic panel     Status: Abnormal   Collection Time: 06/04/19 11:39  PM  Result Value Ref Range   Sodium 141 135 - 145 mmol/L   Potassium 4.8 3.5 - 5.1 mmol/L   Chloride 106 98 - 111 mmol/L   CO2 20 (L) 22 - 32 mmol/L   Glucose, Bld 137 (H) 70 - 99 mg/dL   BUN 48 (H) 8 - 23 mg/dL   Creatinine, Ser 5.17 (H) 0.44 - 1.00 mg/dL   Calcium 9.8 8.9 - 10.3 mg/dL   GFR calc non Af Amer 8 (L) >60 mL/min   GFR calc Af Amer 9 (L) >60 mL/min   Anion gap 15 5 - 15    Comment: Performed at [facility], [place] [address], [place], [place] [address]  Troponin I (High Sensitivity)     Status: Abnormal   Collection Time: 06/04/19 11:39 PM  Result Value Ref Range   Troponin I (High Sensitivity) 34 (H) <18 ng/L    Comment: (NOTE) Elevated high sensitivity troponin I (hsTnI) values and significant  changes across serial measurements may suggest ACS but many other  chronic and acute conditions are known to elevate hsTnI results.  Refer to the "Links" section for chest pain algorithms and additional  guidance. Performed at [facility], [place] [address] [place],  [place] [address]   I-stat chem 8, ED (not at [facility] or [facility])     Status: Abnormal   Collection Time: 06/05/19 12:08 AM  Result Value Ref Range   Sodium 138 135 - 145 mmol/L   Potassium 4.6 3.5 - 5.1 mmol/L   Chloride 108 98 - 111 mmol/L   BUN 52 (H) 8 - 23 mg/dL   Creatinine, Ser 5.00 (H) 0.44 - 1.00 mg/dL   Glucose, Bld 131 (H) 70 - 99 mg/dL   Calcium, Ion 1.15 1.15 - 1.40 mmol/L   TCO2 22 22 - 32 mmol/L   Hemoglobin 9.9 (L) 12.0 - 15.0 g/dL   HCT 29.0 (L) 36.0 - 46.0 %  POC SARS Coronavirus 2 Ag-ED - Nasal Swab (BD Veritor Kit)     Status: None   Collection Time: 06/05/19 12:59 AM  Result Value Ref Range   SARS Coronavirus 2 Ag NEGATIVE NEGATIVE  Comment: (NOTE) SARS-CoV-2 antigen NOT DETECTED.  Negative results are presumptive.  Negative results do not preclude SARS-CoV-2 infection and should not be used as the sole basis for treatment or other patient management decisions, including  infection  control decisions, particularly in the presence of clinical signs and  symptoms consistent with COVID-19, or in those who have been in contact with the virus.  Negative results must be combined with clinical observations, patient history, and epidemiological information. The expected result is Negative. Fact Sheet for Patients: PodPark.tn Fact Sheet for Healthcare Providers: GiftContent.is This test is not yet approved or cleared by the [place] FDA and  has been authorized for detection and/or diagnosis of SARS-CoV-2 by FDA under an Emergency Use Authorization (EUA).  This EUA will remain in effect (meaning this test can be used) for the duration of  the COVID-19 de claration under Section 564(b)(1) of the Act, 21 U.S.C. section 360bbb-3(b)(1), unless the authorization is terminated or revoked sooner.     DG Chest Port 1 View  Result Date: 06/05/2019 CLINICAL DATA:  Shortness of breath EXAM: PORTABLE CHEST 1 VIEW COMPARISON:  November 27, 2018 FINDINGS: The heart size is enlarged. There is developing pulmonary edema as evidence by small bilateral pleural effusions and Kerley B lines. There is an airspace opacity at the left lung base. There is no pneumothorax. Aortic calcifications are noted. There is no acute osseous abnormality. IMPRESSION: 1. Cardiomegaly with developing pulmonary edema. 2. Opacity at the left lung base may represent atelectasis or a developing infiltrate. 3. Small bilateral pleural effusions. Electronically Signed   By: Constance Holster M.D.   On: 06/05/2019 00:27    ROS: As per H&P otherwise rest of the systems reviewed and negative. Physical examination Blood pressure (!) 182/86, pulse (!) 104, temperature (!) 97.4 F (36.3 C), temperature source Oral, resp. rate (!) 23, height _0  (1.702 m), weight 60.8 kg, last menstrual period 03/16/2002, SpO2 99 %. General: Lying on bed, mild increased  work of breathing Neck: No neck mass, Respiratory: Bibasal coarse breath sound, no wheezing Cardiovascular: Regular rate rhythm/normal GI: Abdomen soft, nontender Extremities: Trace lower extremity edema, no cyanosis Vascular: Right upper extremity AV fistula has good thrill and bruit.  Assessment/Plan: 1 acute pulmonary edema/acute hypoxia: Due to missed HD and now fluid overload.  She is still short of breath but somewhat better than prior and requiring around 2 L of oxygen.  Plan for dialysis first in the morning, in only few hours.  Currently the HD nurse is busy with another emergency ESRD patient.  UF during HD.  2 ESRD TTS at [facility]: Missed HD on Saturday.  Receiving dialysis off schedule today.  Assess for dialysis need tomorrow.  3 Hypertension: BP elevated due to volume mediated.  UF during HD.  4. Anemia of ESRD: Received Mircera on 12/10.  Order iron.  5. Metabolic Bone Disease: Monitor phosphorus level.  Resume home medication.  Discussed with the ER physician.  Brandy Houston 06/05/2019, 1:37 AM

## 2019-06-06 DIAGNOSIS — N186 End stage renal disease: Secondary | ICD-10-CM

## 2019-06-06 DIAGNOSIS — Z992 Dependence on renal dialysis: Secondary | ICD-10-CM | POA: Diagnosis not present

## 2019-06-06 DIAGNOSIS — J810 Acute pulmonary edema: Secondary | ICD-10-CM

## 2019-06-06 DIAGNOSIS — I160 Hypertensive urgency: Secondary | ICD-10-CM

## 2019-06-06 LAB — BASIC METABOLIC PANEL
Anion gap: 14 (ref 5–15)
BUN: 34 mg/dL — ABNORMAL HIGH (ref 8–23)
CO2: 25 mmol/L (ref 22–32)
Calcium: 9.1 mg/dL (ref 8.9–10.3)
Chloride: 100 mmol/L (ref 98–111)
Creatinine, Ser: 4.16 mg/dL — ABNORMAL HIGH (ref 0.44–1.00)
GFR calc Af Amer: 12 mL/min — ABNORMAL LOW (ref >60)
GFR calc non Af Amer: 11 mL/min — ABNORMAL LOW (ref >60)
Glucose, Bld: 136 mg/dL — ABNORMAL HIGH (ref 70–99)
Potassium: 3.9 mmol/L (ref 3.5–5.1)
Sodium: 139 mmol/L (ref 135–145)

## 2019-06-06 LAB — CBC WITH DIFFERENTIAL/PLATELET
Abs Immature Granulocytes: 0.07 10*3/uL (ref 0.00–0.07)
Basophils Absolute: 0 10*3/uL (ref 0.0–0.1)
Basophils Relative: 1 %
Eosinophils Absolute: 0 10*3/uL (ref 0.0–0.5)
Eosinophils Relative: 1 %
HCT: 27.5 % — ABNORMAL LOW (ref 36.0–46.0)
Hemoglobin: 8.7 g/dL — ABNORMAL LOW (ref 12.0–15.0)
Immature Granulocytes: 1 %
Lymphocytes Relative: 16 %
Lymphs Abs: 1.1 10*3/uL (ref 0.7–4.0)
MCH: 32.3 pg (ref 26.0–34.0)
MCHC: 31.6 g/dL (ref 30.0–36.0)
MCV: 102.2 fL — ABNORMAL HIGH (ref 80.0–100.0)
Monocytes Absolute: 0.5 10*3/uL (ref 0.1–1.0)
Monocytes Relative: 8 %
Neutro Abs: 5 10*3/uL (ref 1.7–7.7)
Neutrophils Relative %: 73 %
Platelets: 242 10*3/uL (ref 150–400)
RBC: 2.69 MIL/uL — ABNORMAL LOW (ref 3.87–5.11)
RDW: 14.6 % (ref 11.5–15.5)
WBC: 6.8 10*3/uL (ref 4.0–10.5)
nRBC: 0.3 % — ABNORMAL HIGH (ref 0.0–0.2)

## 2019-06-06 LAB — GLUCOSE, CAPILLARY
Glucose-Capillary: 142 mg/dL — ABNORMAL HIGH (ref 70–99)
Glucose-Capillary: 149 mg/dL — ABNORMAL HIGH (ref 70–99)

## 2019-06-06 NOTE — Plan of Care (Signed)
  Problem: Education: Goal: Knowledge of General Education information will improve Description: Including pain rating scale, medication(s)/side effects and non-pharmacologic comfort measures 06/06/2019 0319 by Rexford Maus, RN Outcome: Progressing 06/06/2019 0318 by Rexford Maus, RN Outcome: Progressing   Problem: Health Behavior/Discharge Planning: Goal: Ability to manage health-related needs will improve 06/06/2019 0319 by Rexford Maus, RN Outcome: Progressing 06/06/2019 0318 by Rexford Maus, RN Outcome: Progressing   Problem: Clinical Measurements: Goal: Ability to maintain clinical measurements within normal limits will improve 06/06/2019 0319 by Rexford Maus, RN Outcome: Progressing 06/06/2019 0318 by Rexford Maus, RN Outcome: Progressing Goal: Will remain free from infection Outcome: Progressing

## 2019-06-06 NOTE — Progress Notes (Signed)
Spoke with patients daughter. Updated her on patients progress with her permission. Stated she will call back and speak with the patient later today.

## 2019-06-06 NOTE — Plan of Care (Signed)

## 2019-06-06 NOTE — Progress Notes (Signed)
Discharge education and medication education given to patient  with teach back. All questions and concerns answered.  Peripheral IV and telemetry leads removed. All patient belongings given to patient. Patient transported to main entrance by nurse via wheelchair.

## 2019-06-06 NOTE — Discharge Summary (Signed)
Physician Discharge Summary  Brandy Houston HAL:[id number] DOB: July 17, 1955 DOA: 06/04/2019  PCP: [name], MD  Admit date: 06/04/2019 Discharge date: 06/06/2019  Admitted From: Home Disposition: Home  Recommendations for Outpatient Follow-up:  1. Follow up with PCP in 1 week  2. Outpatient follow-up with hemodialysis as scheduled 3. Follow up in ED if symptoms worsen or new appear   Home Health: No Equipment/Devices: None  Discharge Condition: Stable CODE STATUS: Full Diet recommendation: Heart healthy/carb modified/renal hemodialysis diet  Brief/Interim Summary: 63 year old female with history of end-stage renal disease on hemodialysis, chronic diastolic congestive heart failure, hypertension, hyperlipidemia, diabetes mellitus type 2 presented with respiratory distress after missing dialysis.  During the hospitalization, nephrology was consulted.  COVID-19 testing was negative.  She underwent dialysis and had subsequent improvement of her respiratory symptoms.  Nephrology has cleared her for discharge.  She will be discharged home today.  Discharge Diagnoses:   Acute hypoxic respiratory failure secondary to volume overload/acute pulmonary edema in the setting of missed hemodialysis End-stage renal disease on hemodialysis -Patient presented with hypoxia secondary to missed dialysis and required nonrebreather on presentation.  Chest x-ray showed pulmonary edema and but small bilateral pleural effusions -Nephrology was consulted.  COVID-19 testing was negative. -She underwent dialysis and had subsequent improvement of her respiratory symptoms.  Nephrology has cleared her for discharge.  She will be discharged home today. -She is currently on room air -Needs to be compliant with outpatient dialysis  Hypertensive urgency -Probably secondary to above.  Improved.  Continue home regimen  Mildly elevated troponin -Secondary demand ischemia.  EKG not suggestive of ACS.  Troponins  did not trend up significantly.  Outpatient follow-up.  Diabetes mellitus type 2 -Carb modified diet.  Continue home regimen  Prolonged QT -Improved.    Discharge Instructions  Discharge Instructions    Diet - low sodium heart healthy   Complete by: As directed    Diet Carb Modified   Complete by: As directed    Increase activity slowly   Complete by: As directed      Allergies as of 06/06/2019      Reactions   Compazine [prochlorperazine] Shortness Of Breath, Swelling, Other (See Comments)   TONGUE SWELLS   Shellfish-derived Products Anaphylaxis   Iodinated Diagnostic Agents Hives, Rash   Omnipaque [iohexol] Hives   Tape    Brown paper tape caused skin irritation   Sulfa Antibiotics Rash      Medication List    TAKE these medications   Accu-Chek Aviva Plus w/Device Kit Check sugars TID for E11.65   accu-chek softclix lancets Check sugars TID for E11.65   Accu-Chek Softclix Lancets lancets Check sugars three times a day for E11.65   ALLERGY PO Take 1 tablet by mouth daily as needed (allergies).   amLODipine 10 MG tablet Commonly known as: NORVASC Take 1 tablet (10 mg total) by mouth at bedtime.   aspirin EC 81 MG tablet Take 81 mg by mouth daily.   atorvastatin 10 MG tablet Commonly known as: LIPITOR TAKE 1 TABLET BY MOUTH AT BEDTIME   calcium acetate 667 MG capsule Commonly known as: PHOSLO Take 2 capsules by mouth 3 (three) times daily.   cinacalcet 30 MG tablet Commonly known as: SENSIPAR Take by mouth.   cloNIDine 0.2 MG tablet Commonly known as: CATAPRES Take 0.2 mg by mouth 2 (two) times daily.   gabapentin 100 MG capsule Commonly known as: NEURONTIN Take 200 mg by mouth 3 (three) times daily.  glucose blood test strip Commonly known as: Accu-Chek Aviva Check sugars TID for E11.65   hydrALAZINE 100 MG tablet Commonly known as: APRESOLINE Take 100 mg by mouth as directed. Take 1 tablet TID ONLY ON (SUN MON, WED, FRI)   Lantus  SoloStar 100 UNIT/ML Solostar Pen Generic drug: Insulin Glargine Inject 10 Units into the skin daily.   lidocaine-prilocaine cream Commonly known as: EMLA Apply 1 application topically once.   omeprazole 20 MG capsule Commonly known as: PRILOSEC Take 20 mg by mouth daily before breakfast.   polyethylene glycol 17 g packet Commonly known as: MIRALAX / GLYCOLAX Take 17 g by mouth daily as needed for moderate constipation.   predniSONE 10 MG tablet Commonly known as: DELTASONE Take 1 tablet (10 mg total) by mouth daily with breakfast.   sertraline 100 MG tablet Commonly known as: Zoloft Take 1 tablet (100 mg total) by mouth daily.   sevelamer carbonate 800 MG tablet Commonly known as: RENVELA Take 1,600 mg by mouth 3 (three) times daily with meals.   traZODone 50 MG tablet Commonly known as: DESYREL Take 50 mg by mouth at bedtime.   VITAMIN D PO Take by mouth.      Follow-up Information    [name], MD. Schedule an appointment as soon as possible for a visit in 1 week(s).   Specialty: Family Medicine Contact information: [address] [place] [address] [place] [address] [phone]        Hemodialysis outpatient Follow up.   Why: As scheduled         Allergies  Allergen Reactions  . Compazine [Prochlorperazine] Shortness Of Breath, Swelling and Other (See Comments)    TONGUE SWELLS  . Shellfish-Derived Products Anaphylaxis  . Iodinated Diagnostic Agents Hives and Rash  . Omnipaque [Iohexol] Hives  . Tape     Brown paper tape caused skin irritation  . Sulfa Antibiotics Rash    Consultations:  Nephrology   Procedures/Studies: DG Chest Port 1 View  Result Date: 06/05/2019 CLINICAL DATA:  Shortness of breath EXAM: PORTABLE CHEST 1 VIEW COMPARISON:  November 27, 2018 FINDINGS: The heart size is enlarged. There is developing pulmonary edema as evidence by small bilateral pleural effusions and Kerley B lines. There is an airspace opacity at the left lung base.  There is no pneumothorax. Aortic calcifications are noted. There is no acute osseous abnormality. IMPRESSION: 1. Cardiomegaly with developing pulmonary edema. 2. Opacity at the left lung base may represent atelectasis or a developing infiltrate. 3. Small bilateral pleural effusions. Electronically Signed   By: Constance Holster M.D.   On: 06/05/2019 00:27   XR Shoulder Left  Result Date: 05/25/2019 2 views of her shoulder were taken today I cannot appreciate any acute bony abnormalities.  She has vascular markings that go to the periphery      Subjective: Patient seen and examined at bedside.  She feels much better and wants to go home.  No overnight fever, nausea, vomiting or worsening shortness of breath.  Discharge Exam: Vitals:   06/06/19 0543 06/06/19 0751  BP: (!) 146/77 (!) 167/90  Pulse: 94 (!) 105  Resp: 18 20  Temp: 98.2 F (36.8 C)   SpO2: 96% 100%    General: Pt is alert, awake, not in acute distress.  Poor historian Cardiovascular: Mildly tachycardic, S1/S2 + Respiratory: bilateral decreased breath sounds at bases with some basilar crackles Abdominal: Soft, NT, ND, bowel sounds + Extremities: Trace lower extremity edema, no cyanosis    The results  of significant diagnostics from this hospitalization (including imaging, microbiology, ancillary and laboratory) are listed below for reference.     Microbiology: Recent Results (from the past 240 hour(s))  Respiratory Panel by RT PCR (Flu A&B, Covid) - Nasopharyngeal Swab     Status: None   Collection Time: 06/05/19  1:34 AM   Specimen: Nasopharyngeal Swab  Result Value Ref Range Status   SARS Coronavirus 2 by RT PCR NEGATIVE NEGATIVE Final    Comment: (NOTE) SARS-CoV-2 target nucleic acids are NOT DETECTED. The SARS-CoV-2 RNA is generally detectable in upper respiratoy specimens during the acute phase of infection. The lowest concentration of SARS-CoV-2 viral copies this assay can detect is 131 copies/mL. A  negative result does not preclude SARS-Cov-2 infection and should not be used as the sole basis for treatment or other patient management decisions. A negative result may occur with  improper specimen collection/handling, submission of specimen other than nasopharyngeal swab, presence of viral mutation(s) within the areas targeted by this assay, and inadequate number of viral copies (<131 copies/mL). A negative result must be combined with clinical observations, patient history, and epidemiological information. The expected result is Negative. Fact Sheet for Patients:  PinkCheek.be Fact Sheet for Healthcare Providers:  GravelBags.it This test is not yet ap proved or cleared by the [place] FDA and  has been authorized for detection and/or diagnosis of SARS-CoV-2 by FDA under an Emergency Use Authorization (EUA). This EUA will remain  in effect (meaning this test can be used) for the duration of the COVID-19 declaration under Section 564(b)(1) of the Act, 21 U.S.C. section 360bbb-3(b)(1), unless the authorization is terminated or revoked sooner.    Influenza A by PCR NEGATIVE NEGATIVE Final   Influenza B by PCR NEGATIVE NEGATIVE Final    Comment: (NOTE) The Xpert Xpress SARS-CoV-2/FLU/RSV assay is intended as an aid in  the diagnosis of influenza from Nasopharyngeal swab specimens and  should not be used as a sole basis for treatment. Nasal washings and  aspirates are unacceptable for Xpert Xpress SARS-CoV-2/FLU/RSV  testing. Fact Sheet for Patients: PinkCheek.be Fact Sheet for Healthcare Providers: GravelBags.it This test is not yet approved or cleared by the [place] FDA and  has been authorized for detection and/or diagnosis of SARS-CoV-2 by  FDA under an Emergency Use Authorization (EUA). This EUA will remain  in effect (meaning this test can be used) for  the duration of the  Covid-19 declaration under Section 564(b)(1) of the Act, 21  U.S.C. section 360bbb-3(b)(1), unless the authorization is  terminated or revoked. Performed at [facility], [place] [address] [place], [place] [address]   MRSA PCR Screening     Status: None   Collection Time: 06/05/19  6:57 PM   Specimen: Nasopharyngeal  Result Value Ref Range Status   MRSA by PCR NEGATIVE NEGATIVE Final    Comment:        The GeneXpert MRSA Assay (FDA approved for NASAL specimens only), is one component of a comprehensive MRSA colonization surveillance program. It is not intended to diagnose MRSA infection nor to guide or monitor treatment for MRSA infections. Performed at [facility], [place] [address], [place], [place] [address]      Labs: BNP (last 3 results) Recent Labs    11/27/18 1102  BNP 297.9*   Basic Metabolic Panel: Recent Labs  Lab 06/04/19 2339 06/05/19 0008 06/05/19 0800 06/05/19 1602 06/06/19 0446  NA 141 138  --  137 139  K 4.8 4.6  --  3.7 3.9  CL 106 108  --  101 100  CO2 20*  --   --  23 25  GLUCOSE 137* 131*  --  127* 136*  BUN 48* 52*  --  34* 34*  CREATININE 5.17* 5.00*  --  3.61* 4.16*  CALCIUM 9.8  --   --  8.7* 9.1  MG  --   --  2.7*  --   --   PHOS  --   --   --  1.6*  --    Liver Function Tests: Recent Labs  Lab 06/05/19 1602  ALBUMIN 3.7   No results for input(s): LIPASE, AMYLASE in the last 168 hours. No results for input(s): AMMONIA in the last 168 hours. CBC: Recent Labs  Lab 06/04/19 2339 06/05/19 0008 06/05/19 1602 06/06/19 0446  WBC 8.0  --  9.9 6.8  NEUTROABS 6.3  --   --  5.0  HGB 9.1* 9.9* 8.5* 8.7*  HCT 29.8* 29.0* 26.6* 27.5*  MCV 106.4*  --  100.0 102.2*  PLT 248  --  245 242   Cardiac Enzymes: No results for input(s): CKTOTAL, CKMB, CKMBINDEX, TROPONINI in the last 168 hours. BNP: Invalid input(s): POCBNP CBG: Recent Labs  Lab 06/05/19 0603 06/05/19 1123 06/05/19 1627 06/05/19 2117  06/06/19 0624  GLUCAP 140* 145* 119* 153* 142*   D-Dimer No results for input(s): DDIMER in the last 72 hours. Hgb A1c No results for input(s): HGBA1C in the last 72 hours. Lipid Profile No results for input(s): CHOL, HDL, LDLCALC, TRIG, CHOLHDL, LDLDIRECT in the last 72 hours. Thyroid function studies No results for input(s): TSH, T4TOTAL, T3FREE, THYROIDAB in the last 72 hours.  Invalid input(s): FREET3 Anemia work up No results for input(s): VITAMINB12, FOLATE, FERRITIN, TIBC, IRON, RETICCTPCT in the last 72 hours. Urinalysis    Component Value Date/Time   COLORURINE AMBER (A) 02/08/2017 1138   APPEARANCEUR TURBID (A) 02/08/2017 1138   LABSPEC 1.014 02/08/2017 1138   PHURINE 5.0 02/08/2017 1138   GLUCOSEU NEGATIVE 02/08/2017 1138   HGBUR MODERATE (A) 02/08/2017 1138   BILIRUBINUR NEGATIVE 02/08/2017 1138   KETONESUR NEGATIVE 02/08/2017 1138   PROTEINUR 100 (A) 02/08/2017 1138   UROBILINOGEN 0.2 09/02/2014 1504   NITRITE NEGATIVE 02/08/2017 1138   LEUKOCYTESUR LARGE (A) 02/08/2017 1138   Sepsis Labs Invalid input(s): PROCALCITONIN,  WBC,  LACTICIDVEN Microbiology Recent Results (from the past 240 hour(s))  Respiratory Panel by RT PCR (Flu A&B, Covid) - Nasopharyngeal Swab     Status: None   Collection Time: 06/05/19  1:34 AM   Specimen: Nasopharyngeal Swab  Result Value Ref Range Status   SARS Coronavirus 2 by RT PCR NEGATIVE NEGATIVE Final    Comment: (NOTE) SARS-CoV-2 target nucleic acids are NOT DETECTED. The SARS-CoV-2 RNA is generally detectable in upper respiratoy specimens during the acute phase of infection. The lowest concentration of SARS-CoV-2 viral copies this assay can detect is 131 copies/mL. A negative result does not preclude SARS-Cov-2 infection and should not be used as the sole basis for treatment or other patient management decisions. A negative result may occur with  improper specimen collection/handling, submission of specimen other than  nasopharyngeal swab, presence of viral mutation(s) within the areas targeted by this assay, and inadequate number of viral copies (<131 copies/mL). A negative result must be combined with clinical observations, patient history, and epidemiological information. The expected result is Negative. Fact Sheet for Patients:  PinkCheek.be Fact Sheet for Healthcare Providers:  GravelBags.it This test is not  yet ap proved or cleared by the [place] and  has been authorized for detection and/or diagnosis of SARS-CoV-2 by FDA under an Emergency Use Authorization (EUA). This EUA will remain  in effect (meaning this test can be used) for the duration of the COVID-19 declaration under Section 564(b)(1) of the Act, 21 U.S.C. section 360bbb-3(b)(1), unless the authorization is terminated or revoked sooner.    Influenza A by PCR NEGATIVE NEGATIVE Final   Influenza B by PCR NEGATIVE NEGATIVE Final    Comment: (NOTE) The Xpert Xpress SARS-CoV-2/FLU/RSV assay is intended as an aid in  the diagnosis of influenza from Nasopharyngeal swab specimens and  should not be used as a sole basis for treatment. Nasal washings and  aspirates are unacceptable for Xpert Xpress SARS-CoV-2/FLU/RSV  testing. Fact Sheet for Patients: PinkCheek.be Fact Sheet for Healthcare Providers: GravelBags.it This test is not yet approved or cleared by the [place] FDA and  has been authorized for detection and/or diagnosis of SARS-CoV-2 by  FDA under an Emergency Use Authorization (EUA). This EUA will remain  in effect (meaning this test can be used) for the duration of the  Covid-19 declaration under Section 564(b)(1) of the Act, 21  U.S.C. section 360bbb-3(b)(1), unless the authorization is  terminated or revoked. Performed at [facility], [place] [address] [place], [place] [address]   MRSA PCR  Screening     Status: None   Collection Time: 06/05/19  6:57 PM   Specimen: Nasopharyngeal  Result Value Ref Range Status   MRSA by PCR NEGATIVE NEGATIVE Final    Comment:        The GeneXpert MRSA Assay (FDA approved for NASAL specimens only), is one component of a comprehensive MRSA colonization surveillance program. It is not intended to diagnose MRSA infection nor to guide or monitor treatment for MRSA infections. Performed at [facility], [place] [address] [place], [place] [address]      Time coordinating discharge: 35 minutes  SIGNED:   Aline August, MD  [facility] 06/06/2019, 9:26 AM

## 2019-06-08 ENCOUNTER — Ambulatory Visit: Payer: Medicare Other | Admitting: Physician Assistant

## 2019-06-13 ENCOUNTER — Ambulatory Visit (AMBULATORY_SURGERY_CENTER): Payer: Self-pay | Admitting: *Deleted

## 2019-06-13 ENCOUNTER — Telehealth: Payer: Self-pay | Admitting: *Deleted

## 2019-06-13 ENCOUNTER — Other Ambulatory Visit: Payer: Self-pay

## 2019-06-13 VITALS — Temp 97.7°F | Ht 67.0 in | Wt 143.3 lb

## 2019-06-13 DIAGNOSIS — Z1211 Encounter for screening for malignant neoplasm of colon: Secondary | ICD-10-CM

## 2019-06-13 MED ORDER — SUPREP BOWEL PREP KIT 17.5-3.13-1.6 GM/177ML PO SOLN: 1.0000 | Freq: Once | ORAL | 0 refills | Status: AC

## 2019-06-13 NOTE — Progress Notes (Signed)
No egg or soy allergy known to patient  But eggs cause diarrhea - etas cakes and pies with eggs with no issues  No issues with past sedation with any surgeries  or procedures, no intubation problems  No diet pills per patient No home 02 use per patient  No blood thinners per patient  Pt denies issues with constipation - she uses Miralax daily- pt states hard stools but if uses Miralax she doesn't have to strain as much- she states the dialysis causes the constipation - 2 day Suprep per KN  No A fib or A flutter  EMMI video sent to pt's e mail  .Complicated med hx- TE to [place] to make sure [facility] ok- J Nulty had cleared her for [place] prior to [place] with cardiac issues   Due to the COVID-19 pandemic we are asking patients to follow these guidelines. Please only bring one care partner. Please be aware that your care partner may wait in the car in the parking lot or if they feel like they will be too hot to wait in the car, they may wait in the lobby on the 4th floor. All care partners are required to wear a mask the entire time (we do not have any that we can provide them), they need to practice social distancing, and we will do a Covid check for all patient's and care partners when you arrive. Also we will check their temperature and your temperature. If the care partner waits in their car they need to stay in the parking lot the entire time and we will call them on their cell phone when the patient is ready for discharge so they can bring the car to the front of the building. Also all patient's will need to wear a mask into building.

## 2019-06-13 NOTE — Telephone Encounter (Signed)
Dr Silverio Decamp,  I saw this pt in [place] today- she has avery complicated med hx-  She has a lot of cardiac issues that [facility]st had previously cleared ok for [place]- she was admitted 12-19 for  CHF due to missed dialysis- her last colon 2015 was a poor prep and she a recall 5 yr due to that with a double prep.  I wanted you to review her chart and make sure you did not need to see her prior to an exam with [place] .  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    Just want to be sure, thanks Lelan Pons

## 2019-06-14 NOTE — Telephone Encounter (Signed)
Most recent labs are stable at baseline. Ok to proceed at [facility]. Please advise patient to avoid missing any dialysis sessions.Thanks

## 2019-06-17 DIAGNOSIS — N186 End stage renal disease: Secondary | ICD-10-CM | POA: Diagnosis not present

## 2019-06-17 DIAGNOSIS — E1129 Type 2 diabetes mellitus with other diabetic kidney complication: Secondary | ICD-10-CM | POA: Diagnosis not present

## 2019-06-17 DIAGNOSIS — Z992 Dependence on renal dialysis: Secondary | ICD-10-CM | POA: Diagnosis not present

## 2019-06-19 DIAGNOSIS — D509 Iron deficiency anemia, unspecified: Secondary | ICD-10-CM | POA: Diagnosis not present

## 2019-06-19 DIAGNOSIS — Z992 Dependence on renal dialysis: Secondary | ICD-10-CM | POA: Diagnosis not present

## 2019-06-19 DIAGNOSIS — N186 End stage renal disease: Secondary | ICD-10-CM | POA: Diagnosis not present

## 2019-06-19 DIAGNOSIS — D631 Anemia in chronic kidney disease: Secondary | ICD-10-CM | POA: Diagnosis not present

## 2019-06-19 DIAGNOSIS — N2581 Secondary hyperparathyroidism of renal origin: Secondary | ICD-10-CM | POA: Diagnosis not present

## 2019-06-19 DIAGNOSIS — E1129 Type 2 diabetes mellitus with other diabetic kidney complication: Secondary | ICD-10-CM | POA: Diagnosis not present

## 2019-06-21 DIAGNOSIS — N186 End stage renal disease: Secondary | ICD-10-CM | POA: Diagnosis not present

## 2019-06-21 DIAGNOSIS — E1129 Type 2 diabetes mellitus with other diabetic kidney complication: Secondary | ICD-10-CM | POA: Diagnosis not present

## 2019-06-21 DIAGNOSIS — D631 Anemia in chronic kidney disease: Secondary | ICD-10-CM | POA: Diagnosis not present

## 2019-06-21 DIAGNOSIS — N2581 Secondary hyperparathyroidism of renal origin: Secondary | ICD-10-CM | POA: Diagnosis not present

## 2019-06-21 DIAGNOSIS — Z992 Dependence on renal dialysis: Secondary | ICD-10-CM | POA: Diagnosis not present

## 2019-06-21 DIAGNOSIS — D509 Iron deficiency anemia, unspecified: Secondary | ICD-10-CM | POA: Diagnosis not present

## 2019-06-22 ENCOUNTER — Other Ambulatory Visit: Payer: Self-pay | Admitting: Gastroenterology

## 2019-06-22 ENCOUNTER — Ambulatory Visit (INDEPENDENT_AMBULATORY_CARE_PROVIDER_SITE_OTHER): Payer: Medicare Other

## 2019-06-22 DIAGNOSIS — Z1159 Encounter for screening for other viral diseases: Secondary | ICD-10-CM

## 2019-06-22 LAB — SARS CORONAVIRUS 2 (TAT 6-24 HRS): SARS Coronavirus 2: NEGATIVE

## 2019-06-23 DIAGNOSIS — N186 End stage renal disease: Secondary | ICD-10-CM | POA: Diagnosis not present

## 2019-06-23 DIAGNOSIS — Z992 Dependence on renal dialysis: Secondary | ICD-10-CM | POA: Diagnosis not present

## 2019-06-23 DIAGNOSIS — D631 Anemia in chronic kidney disease: Secondary | ICD-10-CM | POA: Diagnosis not present

## 2019-06-23 DIAGNOSIS — N2581 Secondary hyperparathyroidism of renal origin: Secondary | ICD-10-CM | POA: Diagnosis not present

## 2019-06-23 DIAGNOSIS — D509 Iron deficiency anemia, unspecified: Secondary | ICD-10-CM | POA: Diagnosis not present

## 2019-06-23 DIAGNOSIS — E1129 Type 2 diabetes mellitus with other diabetic kidney complication: Secondary | ICD-10-CM | POA: Diagnosis not present

## 2019-06-25 DIAGNOSIS — Z992 Dependence on renal dialysis: Secondary | ICD-10-CM | POA: Diagnosis not present

## 2019-06-25 DIAGNOSIS — E1129 Type 2 diabetes mellitus with other diabetic kidney complication: Secondary | ICD-10-CM | POA: Diagnosis not present

## 2019-06-25 DIAGNOSIS — D631 Anemia in chronic kidney disease: Secondary | ICD-10-CM | POA: Diagnosis not present

## 2019-06-25 DIAGNOSIS — N2581 Secondary hyperparathyroidism of renal origin: Secondary | ICD-10-CM | POA: Diagnosis not present

## 2019-06-25 DIAGNOSIS — N186 End stage renal disease: Secondary | ICD-10-CM | POA: Diagnosis not present

## 2019-06-25 DIAGNOSIS — D509 Iron deficiency anemia, unspecified: Secondary | ICD-10-CM | POA: Diagnosis not present

## 2019-06-27 ENCOUNTER — Encounter: Payer: Self-pay | Admitting: Gastroenterology

## 2019-06-27 ENCOUNTER — Ambulatory Visit (AMBULATORY_SURGERY_CENTER): Payer: Medicare Other | Admitting: Gastroenterology

## 2019-06-27 ENCOUNTER — Other Ambulatory Visit: Payer: Self-pay

## 2019-06-27 VITALS — BP 112/80 | HR 86 | Temp 98.8°F | Resp 17 | Ht 67.0 in | Wt 144.3 lb

## 2019-06-27 DIAGNOSIS — I120 Hypertensive chronic kidney disease with stage 5 chronic kidney disease or end stage renal disease: Secondary | ICD-10-CM | POA: Diagnosis not present

## 2019-06-27 DIAGNOSIS — Z1211 Encounter for screening for malignant neoplasm of colon: Secondary | ICD-10-CM | POA: Diagnosis not present

## 2019-06-27 DIAGNOSIS — N186 End stage renal disease: Secondary | ICD-10-CM | POA: Diagnosis not present

## 2019-06-27 DIAGNOSIS — K5730 Diverticulosis of large intestine without perforation or abscess without bleeding: Secondary | ICD-10-CM

## 2019-06-27 DIAGNOSIS — Z538 Procedure and treatment not carried out for other reasons: Secondary | ICD-10-CM | POA: Diagnosis not present

## 2019-06-27 DIAGNOSIS — I509 Heart failure, unspecified: Secondary | ICD-10-CM | POA: Diagnosis not present

## 2019-06-27 DIAGNOSIS — E1022 Type 1 diabetes mellitus with diabetic chronic kidney disease: Secondary | ICD-10-CM | POA: Diagnosis not present

## 2019-06-27 MED ORDER — SODIUM CHLORIDE 0.9 % IV SOLN: 500.0000 mL | Freq: Once | INTRAVENOUS | Status: DC

## 2019-06-27 MED ORDER — DEXTROSE 5 % IV SOLN: Freq: Once | INTRAVENOUS | Status: DC

## 2019-06-27 NOTE — Patient Instructions (Signed)
Repeat colonoscopy at next available appointment with extended preparation.  Information on diverticulosis given to you today.  YOU HAD AN ENDOSCOPIC PROCEDURE TODAY AT [facility]:   Refer to the procedure report that was given to you for any specific questions about what was found during the examination.  If the procedure report does not answer your questions, please call your gastroenterologist to clarify.  If you requested that your care partner not be given the details of your procedure findings, then the procedure report has been included in a sealed envelope for you to review at your convenience later.  YOU SHOULD EXPECT: Some feelings of bloating in the abdomen. Passage of more gas than usual.  Walking can help get rid of the air that was put into your GI tract during the procedure and reduce the bloating. If you had a lower endoscopy (such as a colonoscopy or flexible sigmoidoscopy) you may notice spotting of blood in your stool or on the toilet paper. If you underwent a bowel prep for your procedure, you may not have a normal bowel movement for a few days.  Please Note:  You might notice some irritation and congestion in your nose or some drainage.  This is from the oxygen used during your procedure.  There is no need for concern and it should clear up in a day or so.  SYMPTOMS TO REPORT IMMEDIATELY:   Following lower endoscopy (colonoscopy or flexible sigmoidoscopy):  Excessive amounts of blood in the stool  Significant tenderness or worsening of abdominal pains  Swelling of the abdomen that is new, acute  Fever of 100F or higher   For urgent or emergent issues, a gastroenterologist can be reached at any hour by calling [phone].   DIET:  We do recommend a small meal at first, but then you may proceed to your regular diet.  Drink plenty of fluids but you should avoid alcoholic beverages for 24 hours.  ACTIVITY:  You should plan to take it easy for the rest  of today and you should NOT DRIVE or use heavy machinery until tomorrow (because of the sedation medicines used during the test).    FOLLOW UP: Our staff will call the number listed on your records 48-72 hours following your procedure to check on you and address any questions or concerns that you may have regarding the information given to you following your procedure. If we do not reach you, we will leave a message.  We will attempt to reach you two times.  During this call, we will ask if you have developed any symptoms of COVID 19. If you develop any symptoms (ie: fever, flu-like symptoms, shortness of breath, cough etc.) before then, please call [phone].  If you test positive for Covid 19 in the 2 weeks post procedure, please call and report this information to [place].    If any biopsies were taken you will be contacted by phone or by letter within the next 1-3 weeks.  Please call us at [phone] if you have not heard about the biopsies in 3 weeks.    SIGNATURES/CONFIDENTIALITY: You and/or your care partner have signed paperwork which will be entered into your electronic medical record.  These signatures attest to the fact that that the information above on your After Visit Summary has been reviewed and is understood.  Full responsibility of the confidentiality of this discharge information lies with you and/or your care-partner.

## 2019-06-27 NOTE — Progress Notes (Signed)
Report to PACU, RN, vss, BBS= Clear.  

## 2019-06-27 NOTE — Op Note (Signed)
[facility] Patient Name: Brandy Houston Procedure Date: 06/27/2019 10:12 AM MRN: [id number] Endoscopist: Mauri Pole , MD Age: 64 Referring MD:  Date of Birth: 12/10/1955 Gender: Female Account #: [id number] Procedure:                Colonoscopy Indications:              Screening for colorectal malignant neoplasm.                            Inadequate bowel prep on prior exam in 2015, was                            recommended 5 year recall Medicines:                Monitored Anesthesia Care Procedure:                Pre-Anesthesia Assessment:                           - Prior to the procedure, a History and Physical                            was performed, and patient medications and                            allergies were reviewed. [name] tolerance of                            previous anesthesia was also reviewed. The risks                            and benefits of the procedure and the sedation                            options and risks were discussed with the patient.                            All questions were answered, and informed consent                            was obtained. Prior Anticoagulants: The patient has                            taken no previous anticoagulant or antiplatelet                            agents. ASA Grade Assessment: III - A patient with                            severe systemic disease. After reviewing the risks                            and benefits, the patient was deemed in  satisfactory condition to undergo the procedure.                           After obtaining informed consent, the colonoscope                            was passed under direct vision. Throughout the                            procedure, the patient's blood pressure, pulse, and                            oxygen saturations were monitored continuously. The                            Colonoscope was introduced  through the anus and                            advanced to the the sigmoid colon for evaluation.                            This was the intended extent. The colonoscopy was                            technically difficult and complex due to poor bowel                            prep with stool present and restricted mobility of                            the colon. Due to fixed /restricted sigmoid unable                            to advance with pediatric colonoscope, able to                            extend beyond sigmoid by replacing with the adult                            endoscope. The patient tolerated the procedure                            well. The Endoscope was introduced through the anus                            with the intention of advancing to the cecum. The                            scope was advanced to the transverse colon before                            the procedure was aborted. Medications were given. Scope In: 10:16:44 AM Scope Out: 10:38:51 AM Total  Procedure Duration: 0 hours 22 minutes 7 seconds  Findings:                 The perianal and digital rectal examinations were                            normal.                           A moderate amount of stool was found in the entire                            colon, interfering with visualization. Lavage of                            the area was performed using a large amount,                            resulting in incomplete clearance with continued                            poor visualization.                           Multiple small and large-mouthed diverticula were                            found in the sigmoid colon. Complications:            No immediate complications. Estimated Blood Loss:     Estimated blood loss was minimal. Impression:               - Stool in the entire examined colon.                           - Diverticulosis in the sigmoid colon.                           - No specimens  collected. Recommendation:           - Patient has a contact number available for                            emergencies. The signs and symptoms of potential                            delayed complications were discussed with the                            patient. Return to normal activities tomorrow.                            Written discharge instructions were provided to the                            patient.                           -  Resume previous diet.                           - Continue present medications.                           - Repeat colonoscopy at the next available                            appointment because the bowel preparation was                            suboptimal.                           - For future colonoscopy the patient will require                            an extended preparation. If there are any                            questions, please contact the gastroenterologist. Mauri Pole, MD 06/27/2019 10:45:27 AM This report has been signed electronically.

## 2019-06-27 NOTE — Progress Notes (Signed)
Temp JR  VS  CW   Pt's states no medical or surgical changes since previsit or office visit. 

## 2019-06-28 DIAGNOSIS — D509 Iron deficiency anemia, unspecified: Secondary | ICD-10-CM | POA: Diagnosis not present

## 2019-06-28 DIAGNOSIS — N2581 Secondary hyperparathyroidism of renal origin: Secondary | ICD-10-CM | POA: Diagnosis not present

## 2019-06-28 DIAGNOSIS — E1129 Type 2 diabetes mellitus with other diabetic kidney complication: Secondary | ICD-10-CM | POA: Diagnosis not present

## 2019-06-28 DIAGNOSIS — D631 Anemia in chronic kidney disease: Secondary | ICD-10-CM | POA: Diagnosis not present

## 2019-06-28 DIAGNOSIS — Z992 Dependence on renal dialysis: Secondary | ICD-10-CM | POA: Diagnosis not present

## 2019-06-28 DIAGNOSIS — N186 End stage renal disease: Secondary | ICD-10-CM | POA: Diagnosis not present

## 2019-06-29 ENCOUNTER — Telehealth: Payer: Self-pay

## 2019-06-29 NOTE — Telephone Encounter (Signed)
  Follow up Call-  Call back number 06/27/2019  Post procedure Call Back phone  # [phone]  Permission to leave phone message Yes  Some recent data might be hidden     Patient questions:  Do you have a fever, pain , or abdominal swelling? No. Pain Score  0 *  Have you tolerated food without any problems? Yes.    Have you been able to return to your normal activities? Yes.    Do you have any questions about your discharge instructions: Diet   No. Medications  No. Follow up visit  No.  Do you have questions or concerns about your Care? No.  Actions: * If pain score is 4 or above: No action needed, pain <4. 1. Have you developed a fever since your procedure? no  2.   Have you had an respiratory symptoms (SOB or cough) since your procedure? no  3.   Have you tested positive for COVID 19 since your procedure no  4.   Have you had any family members/close contacts diagnosed with the COVID 19 since your procedure?  no   If yes to any of these questions please route to Joylene John, RN and Alphonsa Gin, Therapist, sports.

## 2019-06-30 ENCOUNTER — Telehealth: Payer: Self-pay | Admitting: *Deleted

## 2019-06-30 DIAGNOSIS — Z992 Dependence on renal dialysis: Secondary | ICD-10-CM | POA: Diagnosis not present

## 2019-06-30 DIAGNOSIS — E1129 Type 2 diabetes mellitus with other diabetic kidney complication: Secondary | ICD-10-CM | POA: Diagnosis not present

## 2019-06-30 DIAGNOSIS — D631 Anemia in chronic kidney disease: Secondary | ICD-10-CM | POA: Diagnosis not present

## 2019-06-30 DIAGNOSIS — N186 End stage renal disease: Secondary | ICD-10-CM | POA: Diagnosis not present

## 2019-06-30 DIAGNOSIS — D509 Iron deficiency anemia, unspecified: Secondary | ICD-10-CM | POA: Diagnosis not present

## 2019-06-30 DIAGNOSIS — N2581 Secondary hyperparathyroidism of renal origin: Secondary | ICD-10-CM | POA: Diagnosis not present

## 2019-06-30 NOTE — Telephone Encounter (Signed)
Please advise patient to use Gavilyte prep 2 days before and MiraLAX prep day before procedure. Thank you

## 2019-06-30 NOTE — Telephone Encounter (Signed)
Good morning Dr.Nandigam,  This patient was here on 06/27/2019 for colonoscopy but was aborted due to poor prep. It was documented that she did the 2 day Suprep & Miralax prep and that she did drink all of this. Patient is on hemodialysis. We are still unable to get Golytely due to back order but some pharmacy's have Gavilyte. We also can use Plenvu or Suprep. What is your prep preference for this patient? Thank you, Stepfanie Yott pv

## 2019-06-30 NOTE — Telephone Encounter (Signed)
Noted  

## 2019-07-02 DIAGNOSIS — Z992 Dependence on renal dialysis: Secondary | ICD-10-CM | POA: Diagnosis not present

## 2019-07-02 DIAGNOSIS — N2581 Secondary hyperparathyroidism of renal origin: Secondary | ICD-10-CM | POA: Diagnosis not present

## 2019-07-02 DIAGNOSIS — D509 Iron deficiency anemia, unspecified: Secondary | ICD-10-CM | POA: Diagnosis not present

## 2019-07-02 DIAGNOSIS — D631 Anemia in chronic kidney disease: Secondary | ICD-10-CM | POA: Diagnosis not present

## 2019-07-02 DIAGNOSIS — N186 End stage renal disease: Secondary | ICD-10-CM | POA: Diagnosis not present

## 2019-07-02 DIAGNOSIS — E1129 Type 2 diabetes mellitus with other diabetic kidney complication: Secondary | ICD-10-CM | POA: Diagnosis not present

## 2019-07-05 DIAGNOSIS — Z992 Dependence on renal dialysis: Secondary | ICD-10-CM | POA: Diagnosis not present

## 2019-07-05 DIAGNOSIS — D509 Iron deficiency anemia, unspecified: Secondary | ICD-10-CM | POA: Diagnosis not present

## 2019-07-05 DIAGNOSIS — N186 End stage renal disease: Secondary | ICD-10-CM | POA: Diagnosis not present

## 2019-07-05 DIAGNOSIS — D631 Anemia in chronic kidney disease: Secondary | ICD-10-CM | POA: Diagnosis not present

## 2019-07-05 DIAGNOSIS — E1129 Type 2 diabetes mellitus with other diabetic kidney complication: Secondary | ICD-10-CM | POA: Diagnosis not present

## 2019-07-05 DIAGNOSIS — N2581 Secondary hyperparathyroidism of renal origin: Secondary | ICD-10-CM | POA: Diagnosis not present

## 2019-07-06 ENCOUNTER — Ambulatory Visit (AMBULATORY_SURGERY_CENTER): Payer: Self-pay

## 2019-07-06 ENCOUNTER — Other Ambulatory Visit: Payer: Self-pay

## 2019-07-06 VITALS — Temp 96.6°F | Ht 67.0 in | Wt 143.4 lb

## 2019-07-06 DIAGNOSIS — Z1211 Encounter for screening for malignant neoplasm of colon: Secondary | ICD-10-CM

## 2019-07-06 DIAGNOSIS — Z01818 Encounter for other preprocedural examination: Secondary | ICD-10-CM

## 2019-07-06 MED ORDER — PEG 3350-KCL-NA BICARB-NACL 420 G PO SOLR: 4000.0000 mL | Freq: Once | ORAL | 0 refills | Status: AC

## 2019-07-06 NOTE — Progress Notes (Signed)
No egg or soy allergy known to patient  No issues with past sedation with any surgeries  or procedures, no intubation problems  No diet pills per patient No home 02 use per patient  No blood thinners per patient  Pt has issues with constipation and had a colonoscopy that was cancelled due to inadequate prep, 2 DAY  PREP for this colonoscopy and pt instructed to take miralax daily dose for 5 days prior to colonoscopy.  No A fib or A flutter  EMMI video sent to pt's e mail   Due to the COVID-19 pandemic we are asking patients to follow these guidelines. Please only bring one care partner. Please be aware that your care partner may wait in the car in the parking lot or if they feel like they will be too hot to wait in the car, they may wait in the lobby on the 4th floor. All care partners are required to wear a mask the entire time (we do not have any that we can provide them), they need to practice social distancing, and we will do a Covid check for all patient's and care partners when you arrive. Also we will check their temperature and your temperature. If the care partner waits in their car they need to stay in the parking lot the entire time and we will call them on their cell phone when the patient is ready for discharge so they can bring the car to the front of the building. Also all patient's will need to wear a mask into building.

## 2019-07-07 DIAGNOSIS — D509 Iron deficiency anemia, unspecified: Secondary | ICD-10-CM | POA: Diagnosis not present

## 2019-07-07 DIAGNOSIS — N186 End stage renal disease: Secondary | ICD-10-CM | POA: Diagnosis not present

## 2019-07-07 DIAGNOSIS — N2581 Secondary hyperparathyroidism of renal origin: Secondary | ICD-10-CM | POA: Diagnosis not present

## 2019-07-07 DIAGNOSIS — Z992 Dependence on renal dialysis: Secondary | ICD-10-CM | POA: Diagnosis not present

## 2019-07-07 DIAGNOSIS — E1129 Type 2 diabetes mellitus with other diabetic kidney complication: Secondary | ICD-10-CM | POA: Diagnosis not present

## 2019-07-07 DIAGNOSIS — D631 Anemia in chronic kidney disease: Secondary | ICD-10-CM | POA: Diagnosis not present

## 2019-07-08 ENCOUNTER — Inpatient Hospital Stay: Payer: Medicare Other | Admitting: Family Medicine

## 2019-07-09 DIAGNOSIS — E1129 Type 2 diabetes mellitus with other diabetic kidney complication: Secondary | ICD-10-CM | POA: Diagnosis not present

## 2019-07-09 DIAGNOSIS — Z992 Dependence on renal dialysis: Secondary | ICD-10-CM | POA: Diagnosis not present

## 2019-07-09 DIAGNOSIS — D509 Iron deficiency anemia, unspecified: Secondary | ICD-10-CM | POA: Diagnosis not present

## 2019-07-09 DIAGNOSIS — N2581 Secondary hyperparathyroidism of renal origin: Secondary | ICD-10-CM | POA: Diagnosis not present

## 2019-07-09 DIAGNOSIS — N186 End stage renal disease: Secondary | ICD-10-CM | POA: Diagnosis not present

## 2019-07-09 DIAGNOSIS — D631 Anemia in chronic kidney disease: Secondary | ICD-10-CM | POA: Diagnosis not present

## 2019-07-12 DIAGNOSIS — N2581 Secondary hyperparathyroidism of renal origin: Secondary | ICD-10-CM | POA: Diagnosis not present

## 2019-07-12 DIAGNOSIS — E1129 Type 2 diabetes mellitus with other diabetic kidney complication: Secondary | ICD-10-CM | POA: Diagnosis not present

## 2019-07-12 DIAGNOSIS — N186 End stage renal disease: Secondary | ICD-10-CM | POA: Diagnosis not present

## 2019-07-12 DIAGNOSIS — D509 Iron deficiency anemia, unspecified: Secondary | ICD-10-CM | POA: Diagnosis not present

## 2019-07-12 DIAGNOSIS — D631 Anemia in chronic kidney disease: Secondary | ICD-10-CM | POA: Diagnosis not present

## 2019-07-12 DIAGNOSIS — Z992 Dependence on renal dialysis: Secondary | ICD-10-CM | POA: Diagnosis not present

## 2019-07-14 DIAGNOSIS — Z992 Dependence on renal dialysis: Secondary | ICD-10-CM | POA: Diagnosis not present

## 2019-07-14 DIAGNOSIS — D509 Iron deficiency anemia, unspecified: Secondary | ICD-10-CM | POA: Diagnosis not present

## 2019-07-14 DIAGNOSIS — D631 Anemia in chronic kidney disease: Secondary | ICD-10-CM | POA: Diagnosis not present

## 2019-07-14 DIAGNOSIS — E1129 Type 2 diabetes mellitus with other diabetic kidney complication: Secondary | ICD-10-CM | POA: Diagnosis not present

## 2019-07-14 DIAGNOSIS — N2581 Secondary hyperparathyroidism of renal origin: Secondary | ICD-10-CM | POA: Diagnosis not present

## 2019-07-14 DIAGNOSIS — N186 End stage renal disease: Secondary | ICD-10-CM | POA: Diagnosis not present

## 2019-07-16 DIAGNOSIS — Z992 Dependence on renal dialysis: Secondary | ICD-10-CM | POA: Diagnosis not present

## 2019-07-16 DIAGNOSIS — N2581 Secondary hyperparathyroidism of renal origin: Secondary | ICD-10-CM | POA: Diagnosis not present

## 2019-07-16 DIAGNOSIS — E1129 Type 2 diabetes mellitus with other diabetic kidney complication: Secondary | ICD-10-CM | POA: Diagnosis not present

## 2019-07-16 DIAGNOSIS — D631 Anemia in chronic kidney disease: Secondary | ICD-10-CM | POA: Diagnosis not present

## 2019-07-16 DIAGNOSIS — N186 End stage renal disease: Secondary | ICD-10-CM | POA: Diagnosis not present

## 2019-07-16 DIAGNOSIS — D509 Iron deficiency anemia, unspecified: Secondary | ICD-10-CM | POA: Diagnosis not present

## 2019-07-16 NOTE — Progress Notes (Signed)
Cardiology Office Note:    Date:  07/18/2019   ID:  Brandy Houston, Brandy Houston [birth year], MRN [id number]  PCP:  [name], MD  Cardiologist:  No primary care provider on file.  Electrophysiologist:  None   Referring MD: [name], MD   Chief Complaint  Patient presents with  . Chest Pain   History of Present Illness:    BELLADONNA LUBINSKI is a 64 y.o. female with a hx of ESRD on HD, type 2 diabetes, moderate aortic regurgitation, hypertension, anemia, septic arthritis s/p L AKA who presents for follow-up.  She was referred by Dr. Jimmy Footman for an evaluation of chest pain on 04/04/2019.  She presented to the ED on 11/27/2018 with chest pain.  Work-up was unremarkable but was notable for low hemoglobin (7.1).  States that since that time she has continued to have intermittent left-sided chest pain.  Occurs about 2 times per week.  Typically occurs after HD.  Describes as cramping pain, 5-6 out of 10 in intensity, worse with inspiration.  Last 5 to 10 minutes.  Not related to exertion.  However, states that she also has a second type of chest pain that she gets when she overexerts herself.  Describes as substernal chest tightness, resolves with rest.  States that she does not get this pain often, only when she overexerts herself in physical therapy.  Last TEE in 07/2014 for bacteremia showed no vegetation, normal LVEF, moderate AI.  Given her symptoms, Carlton Adam Myoview was done on 04/15/2019, which showed small fixed defect at apex, otherwise no abnormalities.  TTE on 04/13/2019 showed mild aortic stenosis, moderate aortic regurgitation, normal EF, grade 2 diastolic dysfunction, normal RV function.  Since she was last seen, she was admitted from 12/19 through 06/06/2019 with acute hypoxic respiratory failure secondary to volume overload in the setting of missed hemodialysis.  COVID-19 testing was negative.  She underwent dialysis with improvement in her symptoms.  On room air on discharge.  Also noted  to have hypertensive urgency, improved with HD.  Continues to have chest pain that occurs at the end of HD, reports diffuse muscle cramps.  Does report she also gets chest pain when she overexerts herself, though none recently.  Does report dyspnea on exertion.  BP elevated in clinic today but reports she did not take her medications.  Does report that BP can drop below following HD.  Past Medical History:  Diagnosis Date  . Allergy   . Anemia   . Anxiety   . Arthritis    "in my joints" (03/10/2017)  . Chronic diastolic CHF (congestive heart failure) ([place])   . CKD (chronic kidney disease), stage IV ([place])    stage IV. previous HD, none currently 10/30/15 (confirmed 02/05/2017 & 03/10/2017)  . Depression    Chronic  . Diabetic peripheral neuropathy ([place])   . Dysrhythmia    tachycardia, normal ECHO 08-09-14  . GERD (gastroesophageal reflux disease)   . Heart murmur     dx'd 02/05/2017  . Hepatitis C    "tx'd in 2016; I'm negative now" (02/05/2017)  . History of blood transfusion 2017   "w/knee replacement"  . Hyperlipidemia   . Hypertension   . Osteoarthritis of left knee   . Peripheral neuropathy   . Protein calorie malnutrition ([place])   . Septic arthritis ([place])   . Slow transit constipation   . Type II diabetes mellitus ([name])    IDDM  . Uncontrolled hypertension 02/18/2013  . Unsteady gait  Past Surgical History:  Procedure Laterality Date  . A/V FISTULAGRAM Right 07/07/2018   Procedure: A/V FISTULAGRAM;  Surgeon: Marty Heck, MD;  Location: [facility];  Service: Cardiovascular;  Laterality: Right;  . AMPUTATION Left 03/20/2017   Procedure: LEFT ABOVE KNEE AMPUTATION;  Surgeon: Newt Minion, MD;  Location: [facility];  Service: Orthopedics;  Laterality: Left;  . APPENDECTOMY    . AV FISTULA PLACEMENT Left 09/13/2014   Procedure: Brachial Artery to Brachial Vein Gortex Four - Seven Stretch GRAFT INSERTION Left Forearm;  Surgeon: Mal Misty, MD;  Location: [facility];   Service: Vascular;  Laterality: Left;  . BASCILIC VEIN TRANSPOSITION Right 10/26/2017   Procedure: BASILIC VEIN TRANSPOSITION FIRST STAGE RIGHT ARM;  Surgeon: Conrad Plainville, MD;  Location: [facility];  Service: Vascular;  Laterality: Right;  . BASCILIC VEIN TRANSPOSITION Right 02/01/2018   Procedure: SECOND STAGE BASILIC VEIN TRANSPOSITION RIGHT UPPER EXTREMITY;  Surgeon: Marty Heck, MD;  Location: [facility];  Service: Vascular;  Laterality: Right;  . CHOLECYSTECTOMY OPEN    . COLON SURGERY    . COLONOSCOPY    . EXCISIONAL TOTAL KNEE ARTHROPLASTY WITH ANTIBIOTIC SPACERS Left 02/06/2017   Procedure: Incisional total iknee with antibiotic spacer ;  Surgeon: Leandrew Koyanagi, MD;  Location: [facility];  Service: Orthopedics;  Laterality: Left;  . IR FLUORO GUIDE CV LINE RIGHT  02/10/2017  . IR REMOVAL TUN CV CATH W/O FL  04/01/2017  . IR US GUIDE VASC ACCESS RIGHT  02/10/2017  . IRRIGATION AND DEBRIDEMENT KNEE Left 03/12/2017   Procedure: IRRIGATION AND DEBRIDEMENT LEFT KNEE WITH WOUND VAC APPLICATION;  Surgeon: Leandrew Koyanagi, MD;  Location: [facility];  Service: Orthopedics;  Laterality: Left;  . JOINT REPLACEMENT    . KNEE ARTHROSCOPY Right 08/10/2014   Procedure: ARTHROSCOPY I & D KNEE;  Surgeon: Marianna Payment, MD;  Location: [facility];  Service: Orthopedics;  Laterality: Right;  . KNEE ARTHROSCOPY Left 08/11/2014   Procedure: ARTHROSCOPIC WASHOUT LEFT KNEE;  Surgeon: Marianna Payment, MD;  Location: [facility];  Service: Orthopedics;  Laterality: Left;  . KNEE ARTHROSCOPY Left 08/19/2014   Procedure: ARTHROSCOPIC WASHOUT LEFT KNEE;  Surgeon: Leandrew Koyanagi, MD;  Location: [place];  Service: Orthopedics;  Laterality: Left;  . KNEE ARTHROSCOPY WITH LATERAL MENISECTOMY Left 04/04/2015   Procedure: AND PARTIAL LATERAL MENISECTOMY;  Surgeon: Leandrew Koyanagi, MD;  Location: [facility];  Service: Orthopedics;  Laterality: Left;  . KNEE ARTHROSCOPY WITH MEDIAL MENISECTOMY Left 04/04/2015   Procedure: LEFT KNEE  ARTHROSCOPY WITH PARTIAL MEDIAL MENISCECTOMY  AND SYNOVECTOMY;  Surgeon: Leandrew Koyanagi, MD;  Location: [facility];  Service: Orthopedics;  Laterality: Left;  . PERIPHERAL VASCULAR BALLOON ANGIOPLASTY  07/07/2018   Procedure: PERIPHERAL VASCULAR BALLOON ANGIOPLASTY;  Surgeon: Marty Heck, MD;  Location: [facility];  Service: Cardiovascular;;  right AV fistula  . POLYPECTOMY    . SHOULDER ARTHROSCOPY Bilateral 08/10/2014   Procedure: I & D BILATERAL SHOULDERS ;  Surgeon: Marianna Payment, MD;  Location: [facility];  Service: Orthopedics;  Laterality: Bilateral;  . SMALL INTESTINE SURGERY     Due to Small Bowel Obstruction; "fixed it when they did my gallbladder OR"  . TEE WITHOUT CARDIOVERSION N/A 08/14/2014   Procedure: TRANSESOPHAGEAL ECHOCARDIOGRAM (TEE);  Surgeon: Thayer Headings, MD;  Location: [place];  Service: Cardiovascular;  Laterality: N/A;  . TENOSYNOVECTOMY Right 08/11/2014   Procedure: RIGHT WRIST IRRIGATION AND  DEBRIDEMENT, TENOSYNOVECTOMY;  Surgeon: Marianna Payment, MD;  Location: [facility];  Service: Orthopedics;  Laterality: Right;  . TOTAL KNEE ARTHROPLASTY Left 11/07/2015   Procedure: LEFT TOTAL KNEE ARTHROPLASTY WITH REVISION OF IMPLANTS;  Surgeon: Leandrew Koyanagi, MD;  Location: [facility];  Service: Orthopedics;  Laterality: Left;  . TUBAL LIGATION      Current Medications: Current Meds  Medication Sig  . Accu-Chek Softclix Lancets lancets Check sugars three times a day for E11.65  . amLODipine (NORVASC) 10 MG tablet Take 1 tablet (10 mg total) by mouth at bedtime.  [name] aspirin EC 81 MG tablet Take 81 mg by mouth daily.  [name] atorvastatin (LIPITOR) 10 MG tablet TAKE 1 TABLET BY MOUTH AT BEDTIME  . Blood Glucose Monitoring Suppl (ACCU-CHEK AVIVA PLUS) W/DEVICE KIT Check sugars TID for E11.65  . calcium acetate (PHOSLO) 667 MG capsule Take 2 capsules by mouth 3 (three) times daily.  . Chlorpheniramine Maleate (ALLERGY PO) Take 1 tablet by mouth daily as  needed (allergies).  . cinacalcet (SENSIPAR) 30 MG tablet Take by mouth.  . cloNIDine (CATAPRES) 0.2 MG tablet Take 0.2 mg by mouth 2 (two) times daily.  . Ferrous Sulfate (IRON PO) Take by mouth.  . gabapentin (NEURONTIN) 100 MG capsule Take 200 mg by mouth 3 (three) times daily.  [name] glucose blood (ACCU-CHEK AVIVA) test strip Check sugars TID for E11.65  . hydrALAZINE (APRESOLINE) 100 MG tablet Take 100 mg by mouth as directed. Take 1 tablet TID ONLY ON (SUN MON, WED, FRI)  . Insulin Glargine (LANTUS SOLOSTAR) 100 UNIT/ML Solostar Pen Inject 10 Units into the skin daily.  [name] Devices (ACCU-CHEK SOFTCLIX) lancets Check sugars TID for E11.65  . lidocaine-prilocaine (EMLA) cream Apply 1 application topically once.   . methocarbamol (ROBAXIN) 500 MG tablet   . Methoxy PEG-Epoetin Beta (MIRCERA IJ) Mircera  . multivitamin (RENA-VIT) TABS tablet Take 1 tablet by mouth daily.  [name] omeprazole (PRILOSEC) 20 MG capsule Take 20 mg by mouth daily before breakfast.   . polyethylene glycol (MIRALAX / GLYCOLAX) 17 g packet Take 17 g by mouth daily as needed for moderate constipation.  . predniSONE (DELTASONE) 10 MG tablet Take 1 tablet (10 mg total) by mouth daily with breakfast.  . sertraline (ZOLOFT) 100 MG tablet Take 1 tablet (100 mg total) by mouth daily.  . sertraline (ZOLOFT) 50 MG tablet Take 50 mg by mouth daily.  . sevelamer carbonate (RENVELA) 800 MG tablet Take 1,600 mg by mouth 3 (three) times daily with meals.  . traZODone (DESYREL) 50 MG tablet Take 50 mg by mouth at bedtime.  [name] VITAMIN D PO Take by mouth.   Current Facility-Administered Medications for the 07/18/19 encounter (Office Visit) with Donato Heinz, MD  Medication  . dextrose 5 % solution     Allergies:   Compazine [prochlorperazine], Shellfish-derived products, Iodinated diagnostic agents, Omnipaque [iohexol], Tape, and Sulfa antibiotics   Social History   Socioeconomic History  . Marital status: Single     Spouse name: Not on file  . Number of children: Not on file  . Years of education: Not on file  . Highest education level: Not on file  Occupational History  . Not on file  Tobacco Use  . Smoking status: Current Every Day Smoker    Packs/day: 0.50    Years: 35.00    Pack years: 17.50    Types: Cigarettes  . Smokeless tobacco: Never Used  Substance and Sexual Activity  . Alcohol  use: Not Currently    Alcohol/week: 0.0 standard drinks    Comment: 03/10/2017 "I drink a wine cooler a few times/year"  . Drug use: No  . Sexual activity: Never  Other Topics Concern  . Not on file  Social History Narrative  . Not on file   Social Determinants of Health   Financial Resource Strain:   . Difficulty of Paying Living Expenses: Not on file  Food Insecurity:   . Worried About Charity fundraiser in the Last Year: Not on file  . Ran Out of Food in the Last Year: Not on file  Transportation Needs:   . Lack of Transportation (Medical): Not on file  . Lack of Transportation (Non-Medical): Not on file  Physical Activity:   . Days of Exercise per Week: Not on file  . Minutes of Exercise per Session: Not on file  Stress:   . Feeling of Stress : Not on file  Social Connections:   . Frequency of Communication with Friends and Family: Not on file  . Frequency of Social Gatherings with Friends and Family: Not on file  . Attends Religious Services: Not on file  . Active Member of Clubs or Organizations: Not on file  . Attends Archivist Meetings: Not on file  . Marital Status: Not on file     Family History: [name] family history includes Cancer in her brother, mother, and sister; Diabetes in her brother; Heart disease in her father; Hypertension in her brother and sister; Uterine cancer in her mother and sister. There is no history of Colon cancer, Colon polyps, Esophageal cancer, Rectal cancer, or Stomach cancer.  ROS:   Please see the history of present illness.    All  other systems reviewed and are negative.  EKGs/Labs/Other Studies Reviewed:    The following studies were reviewed today:   EKG:  EKG is  Not ordered today.  The last ekg ordered today demonstrates normal sinus rhythm, rate 89, poor R wave progression, nonspecific T wave flattening  Recent Labs: 11/27/2018: B Natriuretic Peptide 638.8 12/10/2018: ALT 12 06/05/2019: Magnesium 2.7 06/06/2019: BUN 34; Creatinine, Ser 4.16; Hemoglobin 8.7; Platelets 242; Potassium 3.9; Sodium 139  Recent Lipid Panel    Component Value Date/Time   CHOL 111 12/10/2018 1133   TRIG 82.0 12/10/2018 1133   HDL 61.40 12/10/2018 1133   CHOLHDL 2 12/10/2018 1133   VLDL 16.4 12/10/2018 1133   LDLCALC 33 12/10/2018 1133    [place] 03/3019:  Nuclear stress EF: 51%. No wall motion abnormalities. Low normal ejection fraction.  There was no ST segment deviation noted during stress.  Defect 1: There is a small fixed defect of mild severity at the apex. No evidence of ischemia identified.  This is a low risk study. No ischemia identified.  TTE 04/13/19:   1. The AoV is tricuspid and moderately calcified. Mild aortic stenosis is  present with Vmax 2.9 mmHg and mean gradient 18 mmHg. Moderate aortic  regurgitation is present with PHT 352 msec. The aortic root is normal in  size. This appears to be  degenerative calcific AoV disease.  2. Left ventricular ejection fraction, by visual estimation, is 60 to  65%. The left ventricle has normal function. There is moderately increased  left ventricular hypertrophy.  3. Elevated left atrial pressure.  4. Left ventricular diastolic parameters are consistent with Grade II  diastolic dysfunction (pseudonormalization).  5. The left ventricle has no regional wall motion abnormalities.  6. Global  right ventricle has normal systolic function.The right  ventricular size is normal. No increase in right ventricular wall  thickness.  7. Trivial pericardial  effusion is present.  8. Mild calcification of the anterior mitral valve leaflet(s).  9. Mild mitral annular calcification.  10. Mild thickening of the anterior and posterior mitral valve leaflet(s).  11. The mitral valve is degenerative. Trace mitral valve regurgitation.  12. The tricuspid valve is grossly normal. Tricuspid valve regurgitation  is trivial.  13. Aortic valve regurgitation is moderate.  14. The aortic valve is tricuspid. Aortic valve regurgitation is moderate.  Mild aortic valve stenosis.  15. There is Moderate calcification of the aortic valve.  16. There is Moderate thickening of the aortic valve.  17. The pulmonic valve was grossly normal. Pulmonic valve regurgitation is  trivial.  18. Mild plaque invoving the ascending aorta.  19. TR signal is inadequate for assessing pulmonary artery systolic  pressure.  20. The inferior vena cava is normal in size with greater than 50%  respiratory variability, suggesting right atrial pressure of 3 mmHg.   Physical Exam:    VS:  BP (!) 175/86   Pulse 98   Temp (!) 97.5 F (36.4 C)   Ht '5\' 7"'  (1.702 m)   Wt 145 lb 9.6 oz (66 kg)   LMP 03/16/2002   SpO2 98%   BMI 22.80 kg/m     Wt Readings from Last 3 Encounters:  07/18/19 145 lb 9.6 oz (66 kg)  07/06/19 143 lb 6.4 oz (65 kg)  06/27/19 144 lb 4.8 oz (65.5 kg)     GEN: in no acute distress HEENT: Normal NECK: No JVD; No carotid bruits LYMPHATICS: No lymphadenopathy CARDIAC: RRR, 3/6 systolic murmur RESPIRATORY:  Clear to auscultation without rales, wheezing or rhonchi  ABDOMEN: Soft, non-tender, non-distended MUSCULOSKELETAL:  No edema. S/p L AKA SKIN: Warm and dry NEUROLOGIC:  Alert and oriented x 3 PSYCHIATRIC:  Normal affect   ASSESSMENT:    1. Chest pain of uncertain etiology   2. Aortic valve insufficiency, etiology of cardiac valve disease unspecified   3. Essential hypertension    PLAN:    In order of problems listed above:  Chest pain:  Description consistent with 2 types of chest pain.  First type is cramping left-sided pain that occurs after dialysis at rest.  Suspect noncardiac chest pain, likely muscle cramps related to HD.  Second type of chest pain is concerning for typical angina.  Describes substernal chest tightness that occurs with exertion and resolves with rest.  However, no evidence of ischemia on Lexiscan Myoview and patient reports chest pain has improved, denies any recent episodes  Aortic regurgitation: Moderate on TEE in 03/2019.  Also with mild aortic stenosis.  Will need annual TTE for monitoring.  Hypertension: On amlodipine 10 mg daily, hydralazine 100 mg 3 times daily on non-HD days, clonidine 0.2 mg twice daily.  BP is elevated in clinic today but patient reports she did not take her medications today.  Type 2 diabetes: On insulin.  Last A1c 6.3 on 02/28/2019  HLD: On atorvastatin 10 mg daily.  Last LDL 33 on 12/10/2018  ESRD: On HD  RTC in 6 months  Medication Adjustments/Labs and Tests Ordered: Current medicines are reviewed at length with the patient today.  Concerns regarding medicines are outlined above.  No orders of the defined types were placed in this encounter.  No orders of the defined types were placed in this encounter.   Patient Instructions  Medication Instructions:  Your physician recommends that you continue on your current medications as directed. Please refer to the Current Medication list given to you today.  *If you need a refill on your cardiac medications before your next appointment, please call your pharmacy*  Lab Work: NONE  Testing/Procedures: NONE  Follow-Up: At [facility], you and your health needs are our priority.  As part of our continuing mission to provide you with exceptional heart care, we have created designated Provider Care Teams.  These Care Teams include your primary Cardiologist (physician) and Advanced Practice Providers (APPs -  Physician  Assistants and Nurse Practitioners) who all work together to provide you with the care you need, when you need it.  Your next appointment:   6 month(s)  The format for your next appointment:   In Person  Provider:   Oswaldo Milian, MD       Signed, Donato Heinz, MD  07/18/2019 10:47 PM    [place]

## 2019-07-18 ENCOUNTER — Encounter: Payer: Self-pay | Admitting: Cardiology

## 2019-07-18 ENCOUNTER — Ambulatory Visit (INDEPENDENT_AMBULATORY_CARE_PROVIDER_SITE_OTHER): Payer: Medicare Other | Admitting: Cardiology

## 2019-07-18 ENCOUNTER — Other Ambulatory Visit: Payer: Self-pay

## 2019-07-18 VITALS — BP 175/86 | HR 98 | Temp 97.5°F | Ht 67.0 in | Wt 145.6 lb

## 2019-07-18 DIAGNOSIS — N186 End stage renal disease: Secondary | ICD-10-CM | POA: Diagnosis not present

## 2019-07-18 DIAGNOSIS — I351 Nonrheumatic aortic (valve) insufficiency: Secondary | ICD-10-CM

## 2019-07-18 DIAGNOSIS — E1129 Type 2 diabetes mellitus with other diabetic kidney complication: Secondary | ICD-10-CM | POA: Diagnosis not present

## 2019-07-18 DIAGNOSIS — R079 Chest pain, unspecified: Secondary | ICD-10-CM

## 2019-07-18 DIAGNOSIS — Z992 Dependence on renal dialysis: Secondary | ICD-10-CM | POA: Diagnosis not present

## 2019-07-18 DIAGNOSIS — I10 Essential (primary) hypertension: Secondary | ICD-10-CM

## 2019-07-18 NOTE — Patient Instructions (Signed)
Medication Instructions:  Your physician recommends that you continue on your current medications as directed. Please refer to the Current Medication list given to you today.  *If you need a refill on your cardiac medications before your next appointment, please call your pharmacy*  Lab Work: NONE  Testing/Procedures: NONE  Follow-Up: At [facility], you and your health needs are our priority.  As part of our continuing mission to provide you with exceptional heart care, we have created designated Provider Care Teams.  These Care Teams include your primary Cardiologist (physician) and Advanced Practice Providers (APPs -  Physician Assistants and Nurse Practitioners) who all work together to provide you with the care you need, when you need it.  Your next appointment:   6 month(s)  The format for your next appointment:   In Person  Provider:   Oswaldo Milian, MD

## 2019-07-19 DIAGNOSIS — N186 End stage renal disease: Secondary | ICD-10-CM | POA: Diagnosis not present

## 2019-07-19 DIAGNOSIS — Z23 Encounter for immunization: Secondary | ICD-10-CM | POA: Diagnosis not present

## 2019-07-19 DIAGNOSIS — E1129 Type 2 diabetes mellitus with other diabetic kidney complication: Secondary | ICD-10-CM | POA: Diagnosis not present

## 2019-07-19 DIAGNOSIS — Z992 Dependence on renal dialysis: Secondary | ICD-10-CM | POA: Diagnosis not present

## 2019-07-19 DIAGNOSIS — D509 Iron deficiency anemia, unspecified: Secondary | ICD-10-CM | POA: Diagnosis not present

## 2019-07-19 DIAGNOSIS — N2581 Secondary hyperparathyroidism of renal origin: Secondary | ICD-10-CM | POA: Diagnosis not present

## 2019-07-21 DIAGNOSIS — N2581 Secondary hyperparathyroidism of renal origin: Secondary | ICD-10-CM | POA: Diagnosis not present

## 2019-07-21 DIAGNOSIS — D509 Iron deficiency anemia, unspecified: Secondary | ICD-10-CM | POA: Diagnosis not present

## 2019-07-21 DIAGNOSIS — Z23 Encounter for immunization: Secondary | ICD-10-CM | POA: Diagnosis not present

## 2019-07-21 DIAGNOSIS — N186 End stage renal disease: Secondary | ICD-10-CM | POA: Diagnosis not present

## 2019-07-21 DIAGNOSIS — Z992 Dependence on renal dialysis: Secondary | ICD-10-CM | POA: Diagnosis not present

## 2019-07-21 DIAGNOSIS — E1129 Type 2 diabetes mellitus with other diabetic kidney complication: Secondary | ICD-10-CM | POA: Diagnosis not present

## 2019-07-22 ENCOUNTER — Other Ambulatory Visit: Payer: Self-pay | Admitting: Gastroenterology

## 2019-07-22 ENCOUNTER — Ambulatory Visit (INDEPENDENT_AMBULATORY_CARE_PROVIDER_SITE_OTHER): Payer: Medicare Other

## 2019-07-22 DIAGNOSIS — Z1159 Encounter for screening for other viral diseases: Secondary | ICD-10-CM | POA: Diagnosis not present

## 2019-07-23 DIAGNOSIS — E1129 Type 2 diabetes mellitus with other diabetic kidney complication: Secondary | ICD-10-CM | POA: Diagnosis not present

## 2019-07-23 DIAGNOSIS — Z992 Dependence on renal dialysis: Secondary | ICD-10-CM | POA: Diagnosis not present

## 2019-07-23 DIAGNOSIS — N2581 Secondary hyperparathyroidism of renal origin: Secondary | ICD-10-CM | POA: Diagnosis not present

## 2019-07-23 DIAGNOSIS — Z23 Encounter for immunization: Secondary | ICD-10-CM | POA: Diagnosis not present

## 2019-07-23 DIAGNOSIS — N186 End stage renal disease: Secondary | ICD-10-CM | POA: Diagnosis not present

## 2019-07-23 DIAGNOSIS — D509 Iron deficiency anemia, unspecified: Secondary | ICD-10-CM | POA: Diagnosis not present

## 2019-07-23 LAB — SARS CORONAVIRUS 2 (TAT 6-24 HRS): SARS Coronavirus 2: NEGATIVE

## 2019-07-26 DIAGNOSIS — Z23 Encounter for immunization: Secondary | ICD-10-CM | POA: Diagnosis not present

## 2019-07-26 DIAGNOSIS — D509 Iron deficiency anemia, unspecified: Secondary | ICD-10-CM | POA: Diagnosis not present

## 2019-07-26 DIAGNOSIS — Z992 Dependence on renal dialysis: Secondary | ICD-10-CM | POA: Diagnosis not present

## 2019-07-26 DIAGNOSIS — E1129 Type 2 diabetes mellitus with other diabetic kidney complication: Secondary | ICD-10-CM | POA: Diagnosis not present

## 2019-07-26 DIAGNOSIS — N186 End stage renal disease: Secondary | ICD-10-CM | POA: Diagnosis not present

## 2019-07-26 DIAGNOSIS — N2581 Secondary hyperparathyroidism of renal origin: Secondary | ICD-10-CM | POA: Diagnosis not present

## 2019-07-27 ENCOUNTER — Other Ambulatory Visit: Payer: Self-pay

## 2019-07-27 ENCOUNTER — Ambulatory Visit (AMBULATORY_SURGERY_CENTER): Payer: Medicare Other | Admitting: Gastroenterology

## 2019-07-27 ENCOUNTER — Encounter: Payer: Self-pay | Admitting: Gastroenterology

## 2019-07-27 VITALS — BP 117/69 | HR 77 | Temp 97.7°F | Resp 17 | Ht 67.0 in | Wt 143.4 lb

## 2019-07-27 DIAGNOSIS — K5730 Diverticulosis of large intestine without perforation or abscess without bleeding: Secondary | ICD-10-CM | POA: Diagnosis not present

## 2019-07-27 DIAGNOSIS — Z1211 Encounter for screening for malignant neoplasm of colon: Secondary | ICD-10-CM

## 2019-07-27 DIAGNOSIS — Z538 Procedure and treatment not carried out for other reasons: Secondary | ICD-10-CM | POA: Diagnosis not present

## 2019-07-27 MED ORDER — SODIUM CHLORIDE 0.9 % IV SOLN: 500.0000 mL | Freq: Once | INTRAVENOUS | Status: DC

## 2019-07-27 NOTE — Op Note (Signed)
[facility] Patient Name: Brandy Houston Procedure Date: 07/27/2019 8:49 AM MRN: [id number] Endoscopist: Mauri Pole , MD Age: 64 Referring MD:  Date of Birth: 25-May-1956 Gender: Female Account #: [id number] Procedure:                Colonoscopy Indications:              Screening for colorectal malignant neoplasm Medicines:                Monitored Anesthesia Care Procedure:                Pre-Anesthesia Assessment:                           - Prior to the procedure, a History and Physical                            was performed, and patient medications and                            allergies were reviewed. [name] tolerance of                            previous anesthesia was also reviewed. The risks                            and benefits of the procedure and the sedation                            options and risks were discussed with the patient.                            All questions were answered, and informed consent                            was obtained. Prior Anticoagulants: The patient has                            taken no previous anticoagulant or antiplatelet                            agents. ASA Grade Assessment: III - A patient with                            severe systemic disease. After reviewing the risks                            and benefits, the patient was deemed in                            satisfactory condition to undergo the procedure.                           After obtaining informed consent, the colonoscope  was passed under direct vision. Throughout the                            procedure, the patient's blood pressure, pulse, and                            oxygen saturations were monitored continuously. The                            Colonoscope was introduced through the anus and                            advanced to the the transverse colon for                            evaluation. This was  the intended extent. The                            colonoscopy was technically difficult and complex                            due to poor bowel prep with stool present. The                            patient tolerated the procedure well. The quality                            of the bowel preparation was poor. {skip} were                            photographed. Scope In: Scope Out: Findings:                 The perianal and digital rectal examinations were                            normal.                           Scattered small and large-mouthed diverticula were                            found in the sigmoid colon and descending colon.                           A moderate amount of semi-solid stool was found in                            the rectum, in the sigmoid colon, in the descending                            colon and in the transverse colon, interfering with                            visualization.  Lavage of the area was performed,                            resulting in incomplete clearance with continued                            poor visualization. Patient was supposed to be a 2                            day prep but she was unable to prep as instructed                            due to dialysis. Complications:            No immediate complications. Estimated Blood Loss:     Estimated blood loss: none. Impression:               - Preparation of the colon was poor.                           - Diverticulosis in the sigmoid colon and in the                            descending colon.                           - Stool in the rectum, in the sigmoid colon, in the                            descending colon and in the transverse colon.                           - No specimens collected. Recommendation:           - Patient has a contact number available for                            emergencies. The signs and symptoms of potential                            delayed  complications were discussed with the                            patient. Return to normal activities tomorrow.                            Written discharge instructions were provided to the                            patient.                           - Resume previous diet.                           - Continue present medications.                           -  Repeat colonoscopy at appointment to be scheduled                            because the bowel preparation was suboptimal. Mauri Pole, MD 07/27/2019 9:05:32 AM This report has been signed electronically.

## 2019-07-27 NOTE — Patient Instructions (Signed)
YOU HAD AN ENDOSCOPIC PROCEDURE TODAY AT THE [facility]:   Refer to the procedure report that was given to you for any specific questions about what was found during the examination.  If the procedure report does not answer your questions, please call your gastroenterologist to clarify.  If you requested that your care partner not be given the details of your procedure findings, then the procedure report has been included in a sealed envelope for you to review at your convenience later.  YOU SHOULD EXPECT: Some feelings of bloating in the abdomen. Passage of more gas than usual.  Walking can help get rid of the air that was put into your GI tract during the procedure and reduce the bloating. If you had a lower endoscopy (such as a colonoscopy or flexible sigmoidoscopy) you may notice spotting of blood in your stool or on the toilet paper. If you underwent a bowel prep for your procedure, you may not have a normal bowel movement for a few days.  Please Note:  You might notice some irritation and congestion in your nose or some drainage.  This is from the oxygen used during your procedure.  There is no need for concern and it should clear up in a day or so.  SYMPTOMS TO REPORT IMMEDIATELY:   Following lower endoscopy (colonoscopy or flexible sigmoidoscopy):  Excessive amounts of blood in the stool  Significant tenderness or worsening of abdominal pains  Swelling of the abdomen that is new, acute  Fever of 100F or higher  For urgent or emergent issues, a gastroenterologist can be reached at any hour by calling [phone].   DIET:  We do recommend a small meal at first, but then you may proceed to your regular diet.  Drink plenty of fluids but you should avoid alcoholic beverages for 24 hours.  ACTIVITY:  You should plan to take it easy for the rest of today and you should NOT DRIVE or use heavy machinery until tomorrow (because of the sedation medicines used during the test).     FOLLOW UP: Our staff will call the number listed on your records 48-72 hours following your procedure to check on you and address any questions or concerns that you may have regarding the information given to you following your procedure. If we do not reach you, we will leave a message.  We will attempt to reach you two times.  During this call, we will ask if you have developed any symptoms of COVID 19. If you develop any symptoms (ie: fever, flu-like symptoms, shortness of breath, cough etc.) before then, please call [phone].  If you test positive for Covid 19 in the 2 weeks post procedure, please call and report this information to us.    If any biopsies were taken you will be contacted by phone or by letter within the next 1-3 weeks.  Please call us at [phone] if you have not heard about the biopsies in 3 weeks.    SIGNATURES/CONFIDENTIALITY: You and/or your care partner have signed paperwork which will be entered into your electronic medical record.  These signatures attest to the fact that that the information above on your After Visit Summary has been reviewed and is understood.  Full responsibility of the confidentiality of this discharge information lies with you and/or your care-partner. 

## 2019-07-27 NOTE — Progress Notes (Signed)
Pt's states no medical or surgical changes since previsit or office visit. Temp by JB. VS by KA.

## 2019-07-27 NOTE — Progress Notes (Signed)
To PACU, vss. Report to Rn.tb 

## 2019-07-28 DIAGNOSIS — N2581 Secondary hyperparathyroidism of renal origin: Secondary | ICD-10-CM | POA: Diagnosis not present

## 2019-07-28 DIAGNOSIS — N186 End stage renal disease: Secondary | ICD-10-CM | POA: Diagnosis not present

## 2019-07-28 DIAGNOSIS — Z23 Encounter for immunization: Secondary | ICD-10-CM | POA: Diagnosis not present

## 2019-07-28 DIAGNOSIS — D509 Iron deficiency anemia, unspecified: Secondary | ICD-10-CM | POA: Diagnosis not present

## 2019-07-28 DIAGNOSIS — E1129 Type 2 diabetes mellitus with other diabetic kidney complication: Secondary | ICD-10-CM | POA: Diagnosis not present

## 2019-07-28 DIAGNOSIS — Z992 Dependence on renal dialysis: Secondary | ICD-10-CM | POA: Diagnosis not present

## 2019-07-29 ENCOUNTER — Telehealth: Payer: Self-pay

## 2019-07-29 IMAGING — DX DG KNEE 1-2V PORT*L*
2 series · 2 of 2 positions shown · non-contrast
Comparison: 02/08/2016

CLINICAL DATA: Total knee replacement status

EXAM:
PORTABLE LEFT KNEE - 1-2 VIEW

[knee ap]
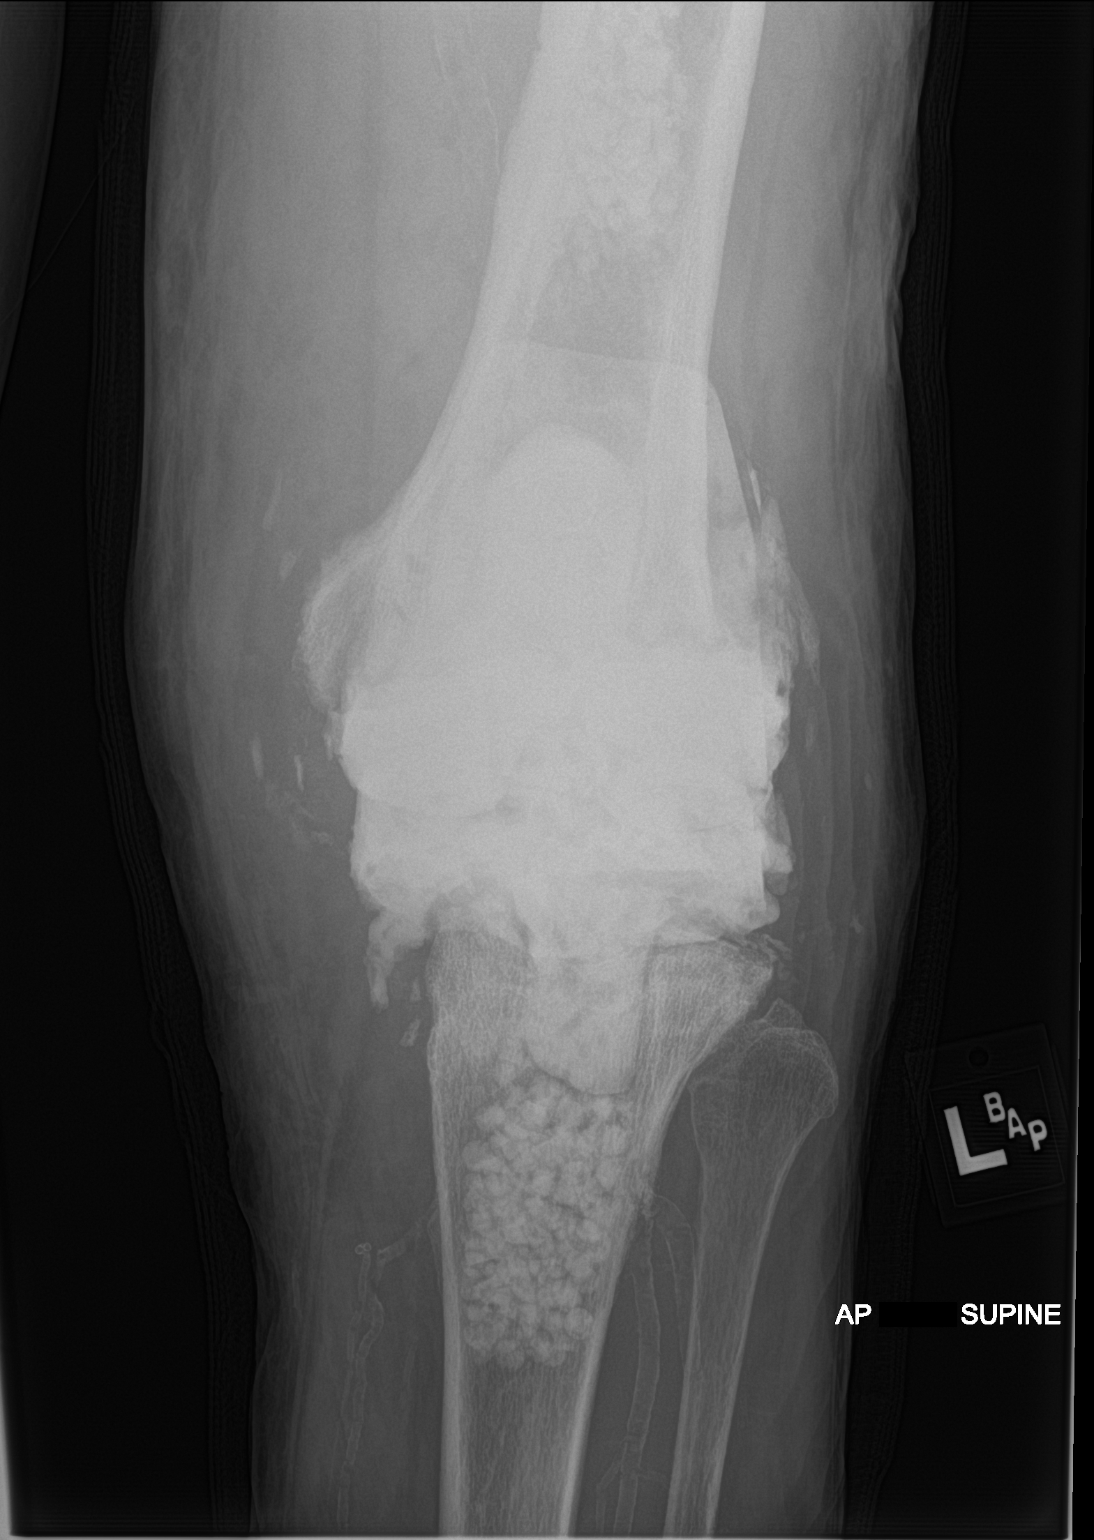

[knee lat]
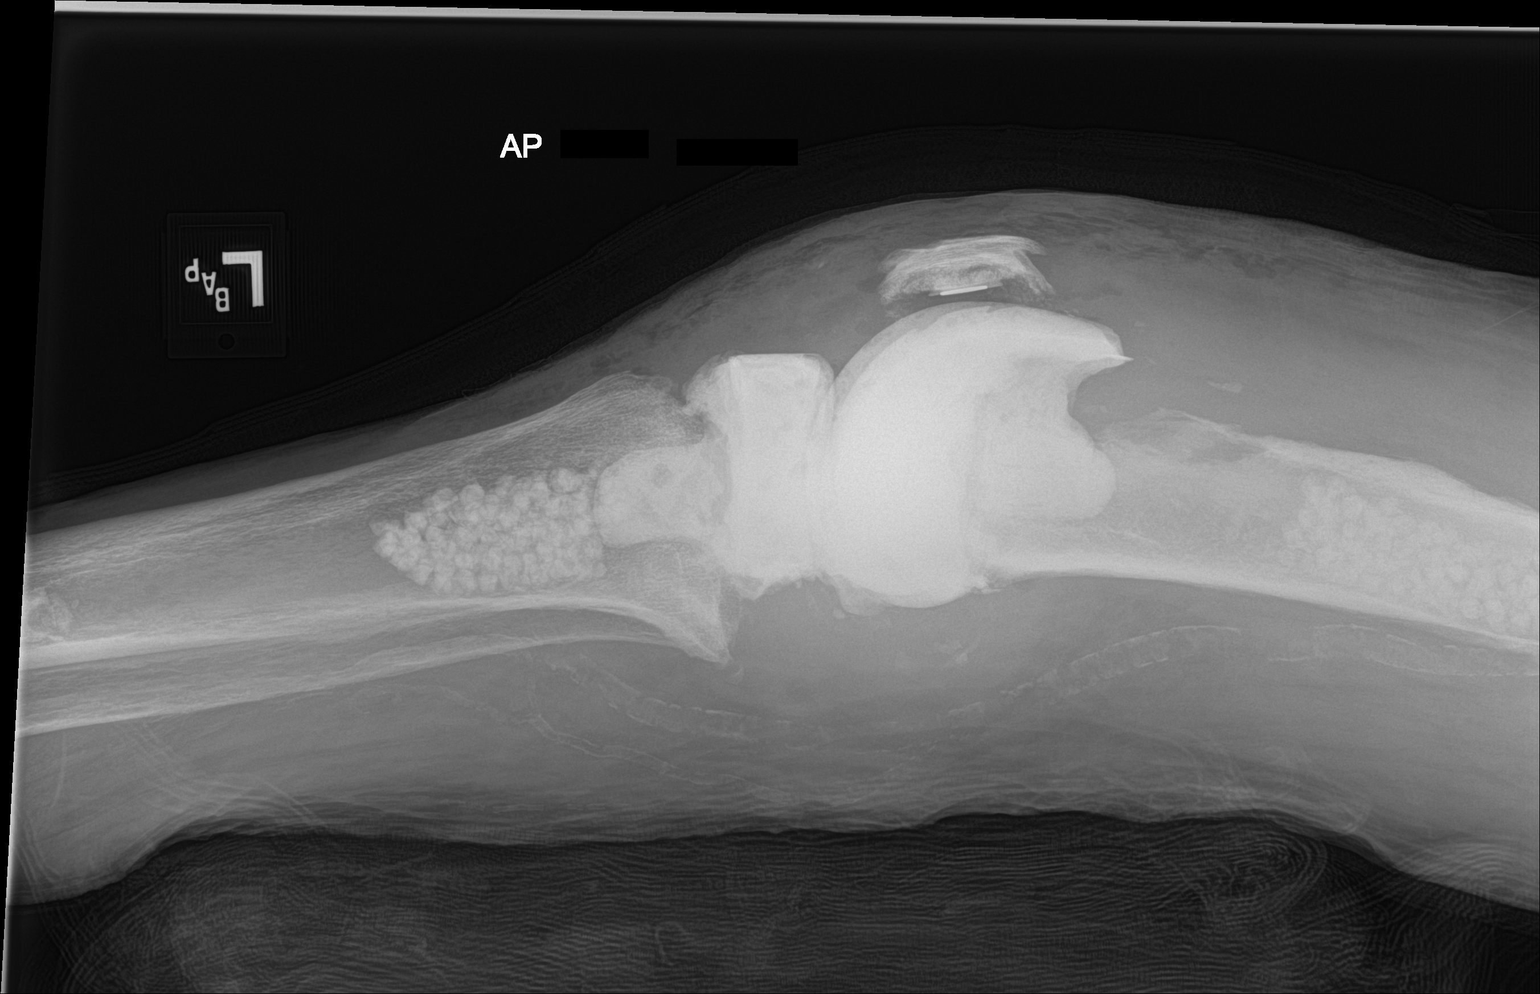

[2 of 2 positions shown; findings below may reference images not displayed]

FINDINGS: Interval removal of left knee arthroplasty with placement of an
antibiotic spacer and antibiotic beads.

Associated soft tissue swelling and suprapatellar knee joint
effusion.

Vascular calcifications.
IMPRESSION: Interval removal of left knee arthroplasty with placement of an
antibiotic spacer and antibiotic beads.

## 2019-07-29 NOTE — Telephone Encounter (Signed)
  Follow up Call-  Call back number 07/27/2019 06/27/2019  Post procedure Call Back phone  # [phone]  Permission to leave phone message Yes Yes  Some recent data might be hidden     Patient questions:  Do you have a fever, pain , or abdominal swelling? No. Pain Score  0 *  Have you tolerated food without any problems? Yes.    Have you been able to return to your normal activities? Yes.    Do you have any questions about your discharge instructions: Diet   No. Medications  No. Follow up visit  No.  Do you have questions or concerns about your Care? No.  Actions: * If pain score is 4 or above: No action needed, pain <4.  Have you developed a fever since your procedure? No 2.   Have you had an respiratory symptoms (SOB or cough) since your procedure?  no  3.   Have you tested positive for COVID 19 since your procedure  No 4.   Have you had any family members/close contacts diagnosed with the COVID 19 since your procedure?  no   If yes to any of these questions please route to Joylene John, RN and Alphonsa Gin, Therapist, sports.

## 2019-07-30 DIAGNOSIS — N2581 Secondary hyperparathyroidism of renal origin: Secondary | ICD-10-CM | POA: Diagnosis not present

## 2019-07-30 DIAGNOSIS — D509 Iron deficiency anemia, unspecified: Secondary | ICD-10-CM | POA: Diagnosis not present

## 2019-07-30 DIAGNOSIS — E1129 Type 2 diabetes mellitus with other diabetic kidney complication: Secondary | ICD-10-CM | POA: Diagnosis not present

## 2019-07-30 DIAGNOSIS — Z992 Dependence on renal dialysis: Secondary | ICD-10-CM | POA: Diagnosis not present

## 2019-07-30 DIAGNOSIS — N186 End stage renal disease: Secondary | ICD-10-CM | POA: Diagnosis not present

## 2019-07-30 DIAGNOSIS — Z23 Encounter for immunization: Secondary | ICD-10-CM | POA: Diagnosis not present

## 2019-08-01 ENCOUNTER — Telehealth: Payer: Self-pay

## 2019-08-01 NOTE — Telephone Encounter (Signed)
Called pt to reschedule colonoscopy due to poor prep on 07/27/2019.  She stated that she is moving right now and will call us to reschedule at a later time.

## 2019-08-02 DIAGNOSIS — Z992 Dependence on renal dialysis: Secondary | ICD-10-CM | POA: Diagnosis not present

## 2019-08-02 DIAGNOSIS — Z23 Encounter for immunization: Secondary | ICD-10-CM | POA: Diagnosis not present

## 2019-08-02 DIAGNOSIS — D509 Iron deficiency anemia, unspecified: Secondary | ICD-10-CM | POA: Diagnosis not present

## 2019-08-02 DIAGNOSIS — E1129 Type 2 diabetes mellitus with other diabetic kidney complication: Secondary | ICD-10-CM | POA: Diagnosis not present

## 2019-08-02 DIAGNOSIS — N186 End stage renal disease: Secondary | ICD-10-CM | POA: Diagnosis not present

## 2019-08-02 DIAGNOSIS — N2581 Secondary hyperparathyroidism of renal origin: Secondary | ICD-10-CM | POA: Diagnosis not present

## 2019-08-06 DIAGNOSIS — D509 Iron deficiency anemia, unspecified: Secondary | ICD-10-CM | POA: Diagnosis not present

## 2019-08-06 DIAGNOSIS — N186 End stage renal disease: Secondary | ICD-10-CM | POA: Diagnosis not present

## 2019-08-06 DIAGNOSIS — Z992 Dependence on renal dialysis: Secondary | ICD-10-CM | POA: Diagnosis not present

## 2019-08-06 DIAGNOSIS — E1129 Type 2 diabetes mellitus with other diabetic kidney complication: Secondary | ICD-10-CM | POA: Diagnosis not present

## 2019-08-06 DIAGNOSIS — N2581 Secondary hyperparathyroidism of renal origin: Secondary | ICD-10-CM | POA: Diagnosis not present

## 2019-08-06 DIAGNOSIS — Z23 Encounter for immunization: Secondary | ICD-10-CM | POA: Diagnosis not present

## 2019-08-09 DIAGNOSIS — Z23 Encounter for immunization: Secondary | ICD-10-CM | POA: Diagnosis not present

## 2019-08-09 DIAGNOSIS — E1129 Type 2 diabetes mellitus with other diabetic kidney complication: Secondary | ICD-10-CM | POA: Diagnosis not present

## 2019-08-09 DIAGNOSIS — N2581 Secondary hyperparathyroidism of renal origin: Secondary | ICD-10-CM | POA: Diagnosis not present

## 2019-08-09 DIAGNOSIS — D509 Iron deficiency anemia, unspecified: Secondary | ICD-10-CM | POA: Diagnosis not present

## 2019-08-09 DIAGNOSIS — Z992 Dependence on renal dialysis: Secondary | ICD-10-CM | POA: Diagnosis not present

## 2019-08-09 DIAGNOSIS — N186 End stage renal disease: Secondary | ICD-10-CM | POA: Diagnosis not present

## 2019-08-11 DIAGNOSIS — Z23 Encounter for immunization: Secondary | ICD-10-CM | POA: Diagnosis not present

## 2019-08-11 DIAGNOSIS — N186 End stage renal disease: Secondary | ICD-10-CM | POA: Diagnosis not present

## 2019-08-11 DIAGNOSIS — Z992 Dependence on renal dialysis: Secondary | ICD-10-CM | POA: Diagnosis not present

## 2019-08-11 DIAGNOSIS — E1129 Type 2 diabetes mellitus with other diabetic kidney complication: Secondary | ICD-10-CM | POA: Diagnosis not present

## 2019-08-11 DIAGNOSIS — D509 Iron deficiency anemia, unspecified: Secondary | ICD-10-CM | POA: Diagnosis not present

## 2019-08-11 DIAGNOSIS — N2581 Secondary hyperparathyroidism of renal origin: Secondary | ICD-10-CM | POA: Diagnosis not present

## 2019-08-13 DIAGNOSIS — N2581 Secondary hyperparathyroidism of renal origin: Secondary | ICD-10-CM | POA: Diagnosis not present

## 2019-08-13 DIAGNOSIS — N186 End stage renal disease: Secondary | ICD-10-CM | POA: Diagnosis not present

## 2019-08-13 DIAGNOSIS — Z992 Dependence on renal dialysis: Secondary | ICD-10-CM | POA: Diagnosis not present

## 2019-08-13 DIAGNOSIS — Z23 Encounter for immunization: Secondary | ICD-10-CM | POA: Diagnosis not present

## 2019-08-13 DIAGNOSIS — E1129 Type 2 diabetes mellitus with other diabetic kidney complication: Secondary | ICD-10-CM | POA: Diagnosis not present

## 2019-08-13 DIAGNOSIS — D509 Iron deficiency anemia, unspecified: Secondary | ICD-10-CM | POA: Diagnosis not present

## 2019-08-15 DIAGNOSIS — E1129 Type 2 diabetes mellitus with other diabetic kidney complication: Secondary | ICD-10-CM | POA: Diagnosis not present

## 2019-08-15 DIAGNOSIS — Z992 Dependence on renal dialysis: Secondary | ICD-10-CM | POA: Diagnosis not present

## 2019-08-15 DIAGNOSIS — N186 End stage renal disease: Secondary | ICD-10-CM | POA: Diagnosis not present

## 2019-08-16 DIAGNOSIS — E1129 Type 2 diabetes mellitus with other diabetic kidney complication: Secondary | ICD-10-CM | POA: Diagnosis not present

## 2019-08-16 DIAGNOSIS — D631 Anemia in chronic kidney disease: Secondary | ICD-10-CM | POA: Diagnosis not present

## 2019-08-16 DIAGNOSIS — Z992 Dependence on renal dialysis: Secondary | ICD-10-CM | POA: Diagnosis not present

## 2019-08-16 DIAGNOSIS — D509 Iron deficiency anemia, unspecified: Secondary | ICD-10-CM | POA: Diagnosis not present

## 2019-08-16 DIAGNOSIS — N186 End stage renal disease: Secondary | ICD-10-CM | POA: Diagnosis not present

## 2019-08-16 DIAGNOSIS — Z23 Encounter for immunization: Secondary | ICD-10-CM | POA: Diagnosis not present

## 2019-08-16 DIAGNOSIS — N2581 Secondary hyperparathyroidism of renal origin: Secondary | ICD-10-CM | POA: Diagnosis not present

## 2019-08-18 DIAGNOSIS — D509 Iron deficiency anemia, unspecified: Secondary | ICD-10-CM | POA: Diagnosis not present

## 2019-08-18 DIAGNOSIS — D631 Anemia in chronic kidney disease: Secondary | ICD-10-CM | POA: Diagnosis not present

## 2019-08-18 DIAGNOSIS — Z992 Dependence on renal dialysis: Secondary | ICD-10-CM | POA: Diagnosis not present

## 2019-08-18 DIAGNOSIS — N2581 Secondary hyperparathyroidism of renal origin: Secondary | ICD-10-CM | POA: Diagnosis not present

## 2019-08-18 DIAGNOSIS — N186 End stage renal disease: Secondary | ICD-10-CM | POA: Diagnosis not present

## 2019-08-18 DIAGNOSIS — E1129 Type 2 diabetes mellitus with other diabetic kidney complication: Secondary | ICD-10-CM | POA: Diagnosis not present

## 2019-08-20 DIAGNOSIS — D631 Anemia in chronic kidney disease: Secondary | ICD-10-CM | POA: Diagnosis not present

## 2019-08-20 DIAGNOSIS — Z992 Dependence on renal dialysis: Secondary | ICD-10-CM | POA: Diagnosis not present

## 2019-08-20 DIAGNOSIS — D509 Iron deficiency anemia, unspecified: Secondary | ICD-10-CM | POA: Diagnosis not present

## 2019-08-20 DIAGNOSIS — N2581 Secondary hyperparathyroidism of renal origin: Secondary | ICD-10-CM | POA: Diagnosis not present

## 2019-08-20 DIAGNOSIS — E1129 Type 2 diabetes mellitus with other diabetic kidney complication: Secondary | ICD-10-CM | POA: Diagnosis not present

## 2019-08-20 DIAGNOSIS — N186 End stage renal disease: Secondary | ICD-10-CM | POA: Diagnosis not present

## 2019-08-23 DIAGNOSIS — N2581 Secondary hyperparathyroidism of renal origin: Secondary | ICD-10-CM | POA: Diagnosis not present

## 2019-08-23 DIAGNOSIS — D631 Anemia in chronic kidney disease: Secondary | ICD-10-CM | POA: Diagnosis not present

## 2019-08-23 DIAGNOSIS — N186 End stage renal disease: Secondary | ICD-10-CM | POA: Diagnosis not present

## 2019-08-23 DIAGNOSIS — E1129 Type 2 diabetes mellitus with other diabetic kidney complication: Secondary | ICD-10-CM | POA: Diagnosis not present

## 2019-08-23 DIAGNOSIS — D509 Iron deficiency anemia, unspecified: Secondary | ICD-10-CM | POA: Diagnosis not present

## 2019-08-23 DIAGNOSIS — Z992 Dependence on renal dialysis: Secondary | ICD-10-CM | POA: Diagnosis not present

## 2019-08-25 DIAGNOSIS — Z992 Dependence on renal dialysis: Secondary | ICD-10-CM | POA: Diagnosis not present

## 2019-08-25 DIAGNOSIS — N2581 Secondary hyperparathyroidism of renal origin: Secondary | ICD-10-CM | POA: Diagnosis not present

## 2019-08-25 DIAGNOSIS — D631 Anemia in chronic kidney disease: Secondary | ICD-10-CM | POA: Diagnosis not present

## 2019-08-25 DIAGNOSIS — E1129 Type 2 diabetes mellitus with other diabetic kidney complication: Secondary | ICD-10-CM | POA: Diagnosis not present

## 2019-08-25 DIAGNOSIS — D509 Iron deficiency anemia, unspecified: Secondary | ICD-10-CM | POA: Diagnosis not present

## 2019-08-25 DIAGNOSIS — N186 End stage renal disease: Secondary | ICD-10-CM | POA: Diagnosis not present

## 2019-08-27 DIAGNOSIS — D631 Anemia in chronic kidney disease: Secondary | ICD-10-CM | POA: Diagnosis not present

## 2019-08-27 DIAGNOSIS — N2581 Secondary hyperparathyroidism of renal origin: Secondary | ICD-10-CM | POA: Diagnosis not present

## 2019-08-27 DIAGNOSIS — D509 Iron deficiency anemia, unspecified: Secondary | ICD-10-CM | POA: Diagnosis not present

## 2019-08-27 DIAGNOSIS — Z992 Dependence on renal dialysis: Secondary | ICD-10-CM | POA: Diagnosis not present

## 2019-08-27 DIAGNOSIS — E1129 Type 2 diabetes mellitus with other diabetic kidney complication: Secondary | ICD-10-CM | POA: Diagnosis not present

## 2019-08-27 DIAGNOSIS — N186 End stage renal disease: Secondary | ICD-10-CM | POA: Diagnosis not present

## 2019-08-30 DIAGNOSIS — D509 Iron deficiency anemia, unspecified: Secondary | ICD-10-CM | POA: Diagnosis not present

## 2019-08-30 DIAGNOSIS — E1129 Type 2 diabetes mellitus with other diabetic kidney complication: Secondary | ICD-10-CM | POA: Diagnosis not present

## 2019-08-30 DIAGNOSIS — N186 End stage renal disease: Secondary | ICD-10-CM | POA: Diagnosis not present

## 2019-08-30 DIAGNOSIS — D631 Anemia in chronic kidney disease: Secondary | ICD-10-CM | POA: Diagnosis not present

## 2019-08-30 DIAGNOSIS — N2581 Secondary hyperparathyroidism of renal origin: Secondary | ICD-10-CM | POA: Diagnosis not present

## 2019-08-30 DIAGNOSIS — Z992 Dependence on renal dialysis: Secondary | ICD-10-CM | POA: Diagnosis not present

## 2019-09-01 DIAGNOSIS — E1129 Type 2 diabetes mellitus with other diabetic kidney complication: Secondary | ICD-10-CM | POA: Diagnosis not present

## 2019-09-01 DIAGNOSIS — D631 Anemia in chronic kidney disease: Secondary | ICD-10-CM | POA: Diagnosis not present

## 2019-09-01 DIAGNOSIS — D509 Iron deficiency anemia, unspecified: Secondary | ICD-10-CM | POA: Diagnosis not present

## 2019-09-01 DIAGNOSIS — N186 End stage renal disease: Secondary | ICD-10-CM | POA: Diagnosis not present

## 2019-09-01 DIAGNOSIS — Z992 Dependence on renal dialysis: Secondary | ICD-10-CM | POA: Diagnosis not present

## 2019-09-01 DIAGNOSIS — N2581 Secondary hyperparathyroidism of renal origin: Secondary | ICD-10-CM | POA: Diagnosis not present

## 2019-09-03 DIAGNOSIS — E1129 Type 2 diabetes mellitus with other diabetic kidney complication: Secondary | ICD-10-CM | POA: Diagnosis not present

## 2019-09-03 DIAGNOSIS — N186 End stage renal disease: Secondary | ICD-10-CM | POA: Diagnosis not present

## 2019-09-03 DIAGNOSIS — D509 Iron deficiency anemia, unspecified: Secondary | ICD-10-CM | POA: Diagnosis not present

## 2019-09-03 DIAGNOSIS — Z992 Dependence on renal dialysis: Secondary | ICD-10-CM | POA: Diagnosis not present

## 2019-09-03 DIAGNOSIS — N2581 Secondary hyperparathyroidism of renal origin: Secondary | ICD-10-CM | POA: Diagnosis not present

## 2019-09-03 DIAGNOSIS — D631 Anemia in chronic kidney disease: Secondary | ICD-10-CM | POA: Diagnosis not present

## 2019-09-06 DIAGNOSIS — D631 Anemia in chronic kidney disease: Secondary | ICD-10-CM | POA: Diagnosis not present

## 2019-09-06 DIAGNOSIS — Z992 Dependence on renal dialysis: Secondary | ICD-10-CM | POA: Diagnosis not present

## 2019-09-06 DIAGNOSIS — N186 End stage renal disease: Secondary | ICD-10-CM | POA: Diagnosis not present

## 2019-09-06 DIAGNOSIS — D509 Iron deficiency anemia, unspecified: Secondary | ICD-10-CM | POA: Diagnosis not present

## 2019-09-06 DIAGNOSIS — N2581 Secondary hyperparathyroidism of renal origin: Secondary | ICD-10-CM | POA: Diagnosis not present

## 2019-09-06 DIAGNOSIS — E1129 Type 2 diabetes mellitus with other diabetic kidney complication: Secondary | ICD-10-CM | POA: Diagnosis not present

## 2019-09-08 DIAGNOSIS — D509 Iron deficiency anemia, unspecified: Secondary | ICD-10-CM | POA: Diagnosis not present

## 2019-09-08 DIAGNOSIS — Z992 Dependence on renal dialysis: Secondary | ICD-10-CM | POA: Diagnosis not present

## 2019-09-08 DIAGNOSIS — D631 Anemia in chronic kidney disease: Secondary | ICD-10-CM | POA: Diagnosis not present

## 2019-09-08 DIAGNOSIS — N186 End stage renal disease: Secondary | ICD-10-CM | POA: Diagnosis not present

## 2019-09-08 DIAGNOSIS — N2581 Secondary hyperparathyroidism of renal origin: Secondary | ICD-10-CM | POA: Diagnosis not present

## 2019-09-08 DIAGNOSIS — E1129 Type 2 diabetes mellitus with other diabetic kidney complication: Secondary | ICD-10-CM | POA: Diagnosis not present

## 2019-09-10 DIAGNOSIS — N186 End stage renal disease: Secondary | ICD-10-CM | POA: Diagnosis not present

## 2019-09-10 DIAGNOSIS — Z992 Dependence on renal dialysis: Secondary | ICD-10-CM | POA: Diagnosis not present

## 2019-09-10 DIAGNOSIS — D631 Anemia in chronic kidney disease: Secondary | ICD-10-CM | POA: Diagnosis not present

## 2019-09-10 DIAGNOSIS — N2581 Secondary hyperparathyroidism of renal origin: Secondary | ICD-10-CM | POA: Diagnosis not present

## 2019-09-10 DIAGNOSIS — D509 Iron deficiency anemia, unspecified: Secondary | ICD-10-CM | POA: Diagnosis not present

## 2019-09-10 DIAGNOSIS — E1129 Type 2 diabetes mellitus with other diabetic kidney complication: Secondary | ICD-10-CM | POA: Diagnosis not present

## 2019-09-13 DIAGNOSIS — N2581 Secondary hyperparathyroidism of renal origin: Secondary | ICD-10-CM | POA: Diagnosis not present

## 2019-09-13 DIAGNOSIS — D631 Anemia in chronic kidney disease: Secondary | ICD-10-CM | POA: Diagnosis not present

## 2019-09-13 DIAGNOSIS — E1129 Type 2 diabetes mellitus with other diabetic kidney complication: Secondary | ICD-10-CM | POA: Diagnosis not present

## 2019-09-13 DIAGNOSIS — D509 Iron deficiency anemia, unspecified: Secondary | ICD-10-CM | POA: Diagnosis not present

## 2019-09-13 DIAGNOSIS — Z992 Dependence on renal dialysis: Secondary | ICD-10-CM | POA: Diagnosis not present

## 2019-09-13 DIAGNOSIS — N186 End stage renal disease: Secondary | ICD-10-CM | POA: Diagnosis not present

## 2019-09-15 DIAGNOSIS — N186 End stage renal disease: Secondary | ICD-10-CM | POA: Diagnosis not present

## 2019-09-15 DIAGNOSIS — D509 Iron deficiency anemia, unspecified: Secondary | ICD-10-CM | POA: Diagnosis not present

## 2019-09-15 DIAGNOSIS — Z992 Dependence on renal dialysis: Secondary | ICD-10-CM | POA: Diagnosis not present

## 2019-09-15 DIAGNOSIS — N2581 Secondary hyperparathyroidism of renal origin: Secondary | ICD-10-CM | POA: Diagnosis not present

## 2019-09-15 DIAGNOSIS — D631 Anemia in chronic kidney disease: Secondary | ICD-10-CM | POA: Diagnosis not present

## 2019-09-15 DIAGNOSIS — E1129 Type 2 diabetes mellitus with other diabetic kidney complication: Secondary | ICD-10-CM | POA: Diagnosis not present

## 2019-09-17 DIAGNOSIS — E1129 Type 2 diabetes mellitus with other diabetic kidney complication: Secondary | ICD-10-CM | POA: Diagnosis not present

## 2019-09-17 DIAGNOSIS — N186 End stage renal disease: Secondary | ICD-10-CM | POA: Diagnosis not present

## 2019-09-17 DIAGNOSIS — N2581 Secondary hyperparathyroidism of renal origin: Secondary | ICD-10-CM | POA: Diagnosis not present

## 2019-09-17 DIAGNOSIS — Z992 Dependence on renal dialysis: Secondary | ICD-10-CM | POA: Diagnosis not present

## 2019-09-17 DIAGNOSIS — D509 Iron deficiency anemia, unspecified: Secondary | ICD-10-CM | POA: Diagnosis not present

## 2019-09-20 DIAGNOSIS — N2581 Secondary hyperparathyroidism of renal origin: Secondary | ICD-10-CM | POA: Diagnosis not present

## 2019-09-20 DIAGNOSIS — E1129 Type 2 diabetes mellitus with other diabetic kidney complication: Secondary | ICD-10-CM | POA: Diagnosis not present

## 2019-09-20 DIAGNOSIS — Z992 Dependence on renal dialysis: Secondary | ICD-10-CM | POA: Diagnosis not present

## 2019-09-20 DIAGNOSIS — D509 Iron deficiency anemia, unspecified: Secondary | ICD-10-CM | POA: Diagnosis not present

## 2019-09-20 DIAGNOSIS — N186 End stage renal disease: Secondary | ICD-10-CM | POA: Diagnosis not present

## 2019-09-22 DIAGNOSIS — N2581 Secondary hyperparathyroidism of renal origin: Secondary | ICD-10-CM | POA: Diagnosis not present

## 2019-09-22 DIAGNOSIS — D509 Iron deficiency anemia, unspecified: Secondary | ICD-10-CM | POA: Diagnosis not present

## 2019-09-22 DIAGNOSIS — E1129 Type 2 diabetes mellitus with other diabetic kidney complication: Secondary | ICD-10-CM | POA: Diagnosis not present

## 2019-09-22 DIAGNOSIS — N186 End stage renal disease: Secondary | ICD-10-CM | POA: Diagnosis not present

## 2019-09-22 DIAGNOSIS — Z992 Dependence on renal dialysis: Secondary | ICD-10-CM | POA: Diagnosis not present

## 2019-09-24 DIAGNOSIS — N2581 Secondary hyperparathyroidism of renal origin: Secondary | ICD-10-CM | POA: Diagnosis not present

## 2019-09-24 DIAGNOSIS — Z992 Dependence on renal dialysis: Secondary | ICD-10-CM | POA: Diagnosis not present

## 2019-09-24 DIAGNOSIS — D509 Iron deficiency anemia, unspecified: Secondary | ICD-10-CM | POA: Diagnosis not present

## 2019-09-24 DIAGNOSIS — N186 End stage renal disease: Secondary | ICD-10-CM | POA: Diagnosis not present

## 2019-09-24 DIAGNOSIS — E1129 Type 2 diabetes mellitus with other diabetic kidney complication: Secondary | ICD-10-CM | POA: Diagnosis not present

## 2019-09-27 DIAGNOSIS — N2581 Secondary hyperparathyroidism of renal origin: Secondary | ICD-10-CM | POA: Diagnosis not present

## 2019-09-27 DIAGNOSIS — N186 End stage renal disease: Secondary | ICD-10-CM | POA: Diagnosis not present

## 2019-09-27 DIAGNOSIS — Z992 Dependence on renal dialysis: Secondary | ICD-10-CM | POA: Diagnosis not present

## 2019-09-27 DIAGNOSIS — D509 Iron deficiency anemia, unspecified: Secondary | ICD-10-CM | POA: Diagnosis not present

## 2019-09-27 DIAGNOSIS — E1129 Type 2 diabetes mellitus with other diabetic kidney complication: Secondary | ICD-10-CM | POA: Diagnosis not present

## 2019-09-28 ENCOUNTER — Ambulatory Visit: Payer: Medicare Other | Admitting: Family Medicine

## 2019-09-29 DIAGNOSIS — D509 Iron deficiency anemia, unspecified: Secondary | ICD-10-CM | POA: Diagnosis not present

## 2019-09-29 DIAGNOSIS — N2581 Secondary hyperparathyroidism of renal origin: Secondary | ICD-10-CM | POA: Diagnosis not present

## 2019-09-29 DIAGNOSIS — N186 End stage renal disease: Secondary | ICD-10-CM | POA: Diagnosis not present

## 2019-09-29 DIAGNOSIS — E1129 Type 2 diabetes mellitus with other diabetic kidney complication: Secondary | ICD-10-CM | POA: Diagnosis not present

## 2019-09-29 DIAGNOSIS — Z992 Dependence on renal dialysis: Secondary | ICD-10-CM | POA: Diagnosis not present

## 2019-10-01 DIAGNOSIS — E1129 Type 2 diabetes mellitus with other diabetic kidney complication: Secondary | ICD-10-CM | POA: Diagnosis not present

## 2019-10-01 DIAGNOSIS — N186 End stage renal disease: Secondary | ICD-10-CM | POA: Diagnosis not present

## 2019-10-01 DIAGNOSIS — D509 Iron deficiency anemia, unspecified: Secondary | ICD-10-CM | POA: Diagnosis not present

## 2019-10-01 DIAGNOSIS — Z992 Dependence on renal dialysis: Secondary | ICD-10-CM | POA: Diagnosis not present

## 2019-10-01 DIAGNOSIS — N2581 Secondary hyperparathyroidism of renal origin: Secondary | ICD-10-CM | POA: Diagnosis not present

## 2019-10-03 ENCOUNTER — Telehealth: Payer: Self-pay

## 2019-10-03 DIAGNOSIS — E11319 Type 2 diabetes mellitus with unspecified diabetic retinopathy without macular edema: Secondary | ICD-10-CM

## 2019-10-03 MED ORDER — ATORVASTATIN CALCIUM 10 MG PO TABS: 10.0000 mg | ORAL_TABLET | Freq: Every day | ORAL | 0 refills | Status: DC

## 2019-10-03 MED ORDER — GLUCOSE BLOOD VI STRP: ORAL_STRIP | 12 refills | Status: DC

## 2019-10-03 MED ORDER — OMEPRAZOLE 20 MG PO CPDR: 20.0000 mg | DELAYED_RELEASE_CAPSULE | Freq: Every day | ORAL | 0 refills | Status: DC

## 2019-10-03 NOTE — Addendum Note (Signed)
Addended by: Thomes Cake on: 10/03/2019 10:35 AM   Modules accepted: Orders

## 2019-10-03 NOTE — Telephone Encounter (Signed)
  LAST APPOINTMENT DATE: 09/28/2019   NEXT APPOINTMENT DATE:@Visit  date not found  MEDICATION:atorvastatin (LIPITOR) 10 MG tablet , omeprazole (PRILOSEC) 20 MG capsule, glucose blood (ACCU-CHEK AVIVA) test strip  PH[facility]ACY: [facility] Phone:  [phone]  Fax:  [phone]      **Let patient know to contact pharmacy at the end of the day to make sure medication is ready. **  ** Please notify patient to allow 48-72 hours to process**  **Encourage patient to contact the pharmacy for refills or they can request refills through [facility]**  CLINICAL FILLS OUT ALL BELOW:   LAST REFILL:  QTY:  REFILL DATE:    OTHER COMMENTS:    Okay for refill?  Please advise

## 2019-10-04 DIAGNOSIS — Z992 Dependence on renal dialysis: Secondary | ICD-10-CM | POA: Diagnosis not present

## 2019-10-04 DIAGNOSIS — E1129 Type 2 diabetes mellitus with other diabetic kidney complication: Secondary | ICD-10-CM | POA: Diagnosis not present

## 2019-10-04 DIAGNOSIS — N186 End stage renal disease: Secondary | ICD-10-CM | POA: Diagnosis not present

## 2019-10-04 DIAGNOSIS — D509 Iron deficiency anemia, unspecified: Secondary | ICD-10-CM | POA: Diagnosis not present

## 2019-10-04 DIAGNOSIS — N2581 Secondary hyperparathyroidism of renal origin: Secondary | ICD-10-CM | POA: Diagnosis not present

## 2019-10-05 ENCOUNTER — Other Ambulatory Visit: Payer: Self-pay

## 2019-10-05 ENCOUNTER — Telehealth: Payer: Self-pay

## 2019-10-05 MED ORDER — BLOOD GLUCOSE MONITOR KIT: PACK | 0 refills | Status: DC

## 2019-10-05 NOTE — Telephone Encounter (Signed)
Spoke with patient, faxed new script for diabetic supplies to pharmacy

## 2019-10-05 NOTE — Telephone Encounter (Signed)
Patient would like a call back in regard. Would like to know if test strips have been sent to pharmacy.

## 2019-10-06 DIAGNOSIS — E1129 Type 2 diabetes mellitus with other diabetic kidney complication: Secondary | ICD-10-CM | POA: Diagnosis not present

## 2019-10-06 DIAGNOSIS — Z992 Dependence on renal dialysis: Secondary | ICD-10-CM | POA: Diagnosis not present

## 2019-10-06 DIAGNOSIS — D509 Iron deficiency anemia, unspecified: Secondary | ICD-10-CM | POA: Diagnosis not present

## 2019-10-06 DIAGNOSIS — N186 End stage renal disease: Secondary | ICD-10-CM | POA: Diagnosis not present

## 2019-10-06 DIAGNOSIS — N2581 Secondary hyperparathyroidism of renal origin: Secondary | ICD-10-CM | POA: Diagnosis not present

## 2019-10-08 DIAGNOSIS — N186 End stage renal disease: Secondary | ICD-10-CM | POA: Diagnosis not present

## 2019-10-08 DIAGNOSIS — D509 Iron deficiency anemia, unspecified: Secondary | ICD-10-CM | POA: Diagnosis not present

## 2019-10-08 DIAGNOSIS — N2581 Secondary hyperparathyroidism of renal origin: Secondary | ICD-10-CM | POA: Diagnosis not present

## 2019-10-08 DIAGNOSIS — Z992 Dependence on renal dialysis: Secondary | ICD-10-CM | POA: Diagnosis not present

## 2019-10-08 DIAGNOSIS — E1129 Type 2 diabetes mellitus with other diabetic kidney complication: Secondary | ICD-10-CM | POA: Diagnosis not present

## 2019-10-10 NOTE — Telephone Encounter (Signed)
Pt called stating the pharmacy did not have kit to go with test strips. Please advise.

## 2019-10-11 DIAGNOSIS — N186 End stage renal disease: Secondary | ICD-10-CM | POA: Diagnosis not present

## 2019-10-11 DIAGNOSIS — N2581 Secondary hyperparathyroidism of renal origin: Secondary | ICD-10-CM | POA: Diagnosis not present

## 2019-10-11 DIAGNOSIS — D509 Iron deficiency anemia, unspecified: Secondary | ICD-10-CM | POA: Diagnosis not present

## 2019-10-11 DIAGNOSIS — Z992 Dependence on renal dialysis: Secondary | ICD-10-CM | POA: Diagnosis not present

## 2019-10-11 DIAGNOSIS — E1129 Type 2 diabetes mellitus with other diabetic kidney complication: Secondary | ICD-10-CM | POA: Diagnosis not present

## 2019-10-11 NOTE — Telephone Encounter (Signed)
Patient is calling in checking up, asked if someone could give her a call when its placed.

## 2019-10-13 ENCOUNTER — Other Ambulatory Visit: Payer: Self-pay

## 2019-10-13 DIAGNOSIS — E1122 Type 2 diabetes mellitus with diabetic chronic kidney disease: Secondary | ICD-10-CM

## 2019-10-13 DIAGNOSIS — N186 End stage renal disease: Secondary | ICD-10-CM | POA: Diagnosis not present

## 2019-10-13 DIAGNOSIS — E1129 Type 2 diabetes mellitus with other diabetic kidney complication: Secondary | ICD-10-CM | POA: Diagnosis not present

## 2019-10-13 DIAGNOSIS — N2581 Secondary hyperparathyroidism of renal origin: Secondary | ICD-10-CM | POA: Diagnosis not present

## 2019-10-13 DIAGNOSIS — Z992 Dependence on renal dialysis: Secondary | ICD-10-CM | POA: Diagnosis not present

## 2019-10-13 DIAGNOSIS — D509 Iron deficiency anemia, unspecified: Secondary | ICD-10-CM | POA: Diagnosis not present

## 2019-10-13 MED ORDER — ACCU-CHEK GUIDE W/DEVICE KIT: 1.0000 | PACK | Freq: Four times a day (QID) | 0 refills | Status: DC

## 2019-10-13 MED ORDER — BLOOD GLUCOSE MONITOR KIT: PACK | 0 refills | Status: AC

## 2019-10-13 NOTE — Telephone Encounter (Signed)
Left voice message for patient to call clinic.  

## 2019-10-13 NOTE — Telephone Encounter (Signed)
ka

## 2019-10-13 NOTE — Telephone Encounter (Signed)
Glucometer device and supply script faxed to pharmacy

## 2019-10-14 ENCOUNTER — Other Ambulatory Visit: Payer: Self-pay

## 2019-10-14 MED ORDER — ACCU-CHEK GUIDE VI STRP: ORAL_STRIP | 12 refills | Status: DC

## 2019-10-15 DIAGNOSIS — Z992 Dependence on renal dialysis: Secondary | ICD-10-CM | POA: Diagnosis not present

## 2019-10-15 DIAGNOSIS — N186 End stage renal disease: Secondary | ICD-10-CM | POA: Diagnosis not present

## 2019-10-15 DIAGNOSIS — E1129 Type 2 diabetes mellitus with other diabetic kidney complication: Secondary | ICD-10-CM | POA: Diagnosis not present

## 2019-10-18 DIAGNOSIS — Z992 Dependence on renal dialysis: Secondary | ICD-10-CM | POA: Diagnosis not present

## 2019-10-18 DIAGNOSIS — N2581 Secondary hyperparathyroidism of renal origin: Secondary | ICD-10-CM | POA: Diagnosis not present

## 2019-10-18 DIAGNOSIS — D509 Iron deficiency anemia, unspecified: Secondary | ICD-10-CM | POA: Diagnosis not present

## 2019-10-18 DIAGNOSIS — E1129 Type 2 diabetes mellitus with other diabetic kidney complication: Secondary | ICD-10-CM | POA: Diagnosis not present

## 2019-10-18 DIAGNOSIS — D631 Anemia in chronic kidney disease: Secondary | ICD-10-CM | POA: Diagnosis not present

## 2019-10-18 DIAGNOSIS — N186 End stage renal disease: Secondary | ICD-10-CM | POA: Diagnosis not present

## 2019-10-19 ENCOUNTER — Other Ambulatory Visit: Payer: Self-pay

## 2019-10-19 ENCOUNTER — Telehealth: Payer: Self-pay | Admitting: Family Medicine

## 2019-10-19 ENCOUNTER — Telehealth: Payer: Self-pay

## 2019-10-19 DIAGNOSIS — E1122 Type 2 diabetes mellitus with diabetic chronic kidney disease: Secondary | ICD-10-CM

## 2019-10-19 DIAGNOSIS — N185 Chronic kidney disease, stage 5: Secondary | ICD-10-CM

## 2019-10-19 MED ORDER — ACCU-CHEK GUIDE VI STRP: ORAL_STRIP | 12 refills | Status: DC

## 2019-10-19 NOTE — Telephone Encounter (Signed)
I have resent again to pharmacy

## 2019-10-19 NOTE — Telephone Encounter (Signed)
Patient calling stating she did not get her test strips. Got everything else was gotten but that. Please advise jk

## 2019-10-20 DIAGNOSIS — D509 Iron deficiency anemia, unspecified: Secondary | ICD-10-CM | POA: Diagnosis not present

## 2019-10-20 DIAGNOSIS — N2581 Secondary hyperparathyroidism of renal origin: Secondary | ICD-10-CM | POA: Diagnosis not present

## 2019-10-20 DIAGNOSIS — D631 Anemia in chronic kidney disease: Secondary | ICD-10-CM | POA: Diagnosis not present

## 2019-10-20 DIAGNOSIS — Z992 Dependence on renal dialysis: Secondary | ICD-10-CM | POA: Diagnosis not present

## 2019-10-20 DIAGNOSIS — N186 End stage renal disease: Secondary | ICD-10-CM | POA: Diagnosis not present

## 2019-10-20 DIAGNOSIS — E1129 Type 2 diabetes mellitus with other diabetic kidney complication: Secondary | ICD-10-CM | POA: Diagnosis not present

## 2019-10-20 MED ORDER — ACCU-CHEK GUIDE VI STRP: ORAL_STRIP | 12 refills | Status: AC

## 2019-10-20 NOTE — Addendum Note (Signed)
Addended by: Thomes Cake on: 10/20/2019 11:52 AM   Modules accepted: Orders

## 2019-10-20 NOTE — Telephone Encounter (Signed)
Pharmacy called stating they need a new e-scribe prescription sent over with a diagnosis code included for the test strips. Please advise.

## 2019-10-20 NOTE — Telephone Encounter (Signed)
New script has been sent to the patient's pharmacy with the diagnosis code.

## 2019-10-22 ENCOUNTER — Observation Stay (HOSPITAL_COMMUNITY)
Admission: EM | Admit: 2019-10-22 | Discharge: 2019-10-23 | Disposition: A | Discharge status: Home / Self Care | Payer: Medicare Other | Attending: Internal Medicine | Admitting: Internal Medicine

## 2019-10-22 ENCOUNTER — Emergency Department (HOSPITAL_COMMUNITY): Payer: Medicare Other

## 2019-10-22 ENCOUNTER — Other Ambulatory Visit: Payer: Self-pay

## 2019-10-22 DIAGNOSIS — E1142 Type 2 diabetes mellitus with diabetic polyneuropathy: Secondary | ICD-10-CM | POA: Diagnosis not present

## 2019-10-22 DIAGNOSIS — Z89612 Acquired absence of left leg above knee: Secondary | ICD-10-CM | POA: Insufficient documentation

## 2019-10-22 DIAGNOSIS — Z7952 Long term (current) use of systemic steroids: Secondary | ICD-10-CM | POA: Diagnosis not present

## 2019-10-22 DIAGNOSIS — N2581 Secondary hyperparathyroidism of renal origin: Secondary | ICD-10-CM | POA: Diagnosis not present

## 2019-10-22 DIAGNOSIS — Z87891 Personal history of nicotine dependence: Secondary | ICD-10-CM | POA: Insufficient documentation

## 2019-10-22 DIAGNOSIS — I132 Hypertensive heart and chronic kidney disease with heart failure and with stage 5 chronic kidney disease, or end stage renal disease: Secondary | ICD-10-CM | POA: Diagnosis not present

## 2019-10-22 DIAGNOSIS — D631 Anemia in chronic kidney disease: Secondary | ICD-10-CM | POA: Diagnosis not present

## 2019-10-22 DIAGNOSIS — N186 End stage renal disease: Secondary | ICD-10-CM | POA: Insufficient documentation

## 2019-10-22 DIAGNOSIS — F419 Anxiety disorder, unspecified: Secondary | ICD-10-CM | POA: Diagnosis not present

## 2019-10-22 DIAGNOSIS — R079 Chest pain, unspecified: Secondary | ICD-10-CM | POA: Diagnosis not present

## 2019-10-22 DIAGNOSIS — Z791 Long term (current) use of non-steroidal anti-inflammatories (NSAID): Secondary | ICD-10-CM | POA: Insufficient documentation

## 2019-10-22 DIAGNOSIS — R0789 Other chest pain: Secondary | ICD-10-CM | POA: Diagnosis not present

## 2019-10-22 DIAGNOSIS — D509 Iron deficiency anemia, unspecified: Secondary | ICD-10-CM | POA: Diagnosis not present

## 2019-10-22 DIAGNOSIS — I5032 Chronic diastolic (congestive) heart failure: Secondary | ICD-10-CM | POA: Diagnosis not present

## 2019-10-22 DIAGNOSIS — K219 Gastro-esophageal reflux disease without esophagitis: Secondary | ICD-10-CM | POA: Insufficient documentation

## 2019-10-22 DIAGNOSIS — D62 Acute posthemorrhagic anemia: Secondary | ICD-10-CM | POA: Diagnosis not present

## 2019-10-22 DIAGNOSIS — D500 Iron deficiency anemia secondary to blood loss (chronic): Secondary | ICD-10-CM

## 2019-10-22 DIAGNOSIS — E1122 Type 2 diabetes mellitus with diabetic chronic kidney disease: Secondary | ICD-10-CM | POA: Diagnosis not present

## 2019-10-22 DIAGNOSIS — Z96652 Presence of left artificial knee joint: Secondary | ICD-10-CM | POA: Diagnosis not present

## 2019-10-22 DIAGNOSIS — Z7982 Long term (current) use of aspirin: Secondary | ICD-10-CM | POA: Insufficient documentation

## 2019-10-22 DIAGNOSIS — Z79899 Other long term (current) drug therapy: Secondary | ICD-10-CM | POA: Diagnosis not present

## 2019-10-22 DIAGNOSIS — Z992 Dependence on renal dialysis: Secondary | ICD-10-CM | POA: Diagnosis not present

## 2019-10-22 DIAGNOSIS — Z8619 Personal history of other infectious and parasitic diseases: Secondary | ICD-10-CM | POA: Insufficient documentation

## 2019-10-22 DIAGNOSIS — D649 Anemia, unspecified: Secondary | ICD-10-CM | POA: Diagnosis present

## 2019-10-22 DIAGNOSIS — F339 Major depressive disorder, recurrent, unspecified: Secondary | ICD-10-CM | POA: Diagnosis not present

## 2019-10-22 DIAGNOSIS — R9431 Abnormal electrocardiogram [ECG] [EKG]: Secondary | ICD-10-CM | POA: Diagnosis not present

## 2019-10-22 DIAGNOSIS — E1129 Type 2 diabetes mellitus with other diabetic kidney complication: Secondary | ICD-10-CM | POA: Diagnosis not present

## 2019-10-22 DIAGNOSIS — E785 Hyperlipidemia, unspecified: Secondary | ICD-10-CM | POA: Insufficient documentation

## 2019-10-22 DIAGNOSIS — I517 Cardiomegaly: Secondary | ICD-10-CM | POA: Diagnosis not present

## 2019-10-22 LAB — CBC WITH DIFFERENTIAL/PLATELET
Abs Immature Granulocytes: 0.01 10*3/uL (ref 0.00–0.07)
Basophils Absolute: 0 10*3/uL (ref 0.0–0.1)
Basophils Relative: 1 %
Eosinophils Absolute: 0.1 10*3/uL (ref 0.0–0.5)
Eosinophils Relative: 2 %
HCT: 20.5 % — ABNORMAL LOW (ref 36.0–46.0)
Hemoglobin: 6 g/dL — CL (ref 12.0–15.0)
Immature Granulocytes: 0 %
Lymphocytes Relative: 17 %
Lymphs Abs: 0.7 10*3/uL (ref 0.7–4.0)
MCH: 28.7 pg (ref 26.0–34.0)
MCHC: 29.3 g/dL — ABNORMAL LOW (ref 30.0–36.0)
MCV: 98.1 fL (ref 80.0–100.0)
Monocytes Absolute: 0.4 10*3/uL (ref 0.1–1.0)
Monocytes Relative: 10 %
Neutro Abs: 2.8 10*3/uL (ref 1.7–7.7)
Neutrophils Relative %: 70 %
Platelets: 191 10*3/uL (ref 150–400)
RBC: 2.09 MIL/uL — ABNORMAL LOW (ref 3.87–5.11)
RDW: 17.2 % — ABNORMAL HIGH (ref 11.5–15.5)
WBC: 4 10*3/uL (ref 4.0–10.5)
nRBC: 0 % (ref 0.0–0.2)

## 2019-10-22 LAB — HEMOGLOBIN A1C
Hgb A1c MFr Bld: 4.4 % — ABNORMAL LOW (ref 4.8–5.6)
Mean Plasma Glucose: 79.58 mg/dL

## 2019-10-22 LAB — COMPREHENSIVE METABOLIC PANEL
ALT: 20 U/L (ref 0–44)
AST: 14 U/L — ABNORMAL LOW (ref 15–41)
Albumin: 3 g/dL — ABNORMAL LOW (ref 3.5–5.0)
Alkaline Phosphatase: 46 U/L (ref 38–126)
Anion gap: 11 (ref 5–15)
BUN: 18 mg/dL (ref 8–23)
CO2: 29 mmol/L (ref 22–32)
Calcium: 7.7 mg/dL — ABNORMAL LOW (ref 8.9–10.3)
Chloride: 98 mmol/L (ref 98–111)
Creatinine, Ser: 3.07 mg/dL — ABNORMAL HIGH (ref 0.44–1.00)
GFR calc Af Amer: 18 mL/min — ABNORMAL LOW (ref >60)
GFR calc non Af Amer: 15 mL/min — ABNORMAL LOW (ref >60)
Glucose, Bld: 122 mg/dL — ABNORMAL HIGH (ref 70–99)
Potassium: 3 mmol/L — ABNORMAL LOW (ref 3.5–5.1)
Sodium: 138 mmol/L (ref 135–145)
Total Bilirubin: 0.7 mg/dL (ref 0.3–1.2)
Total Protein: 6.1 g/dL — ABNORMAL LOW (ref 6.5–8.1)

## 2019-10-22 LAB — CBG MONITORING, ED: Glucose-Capillary: 85 mg/dL (ref 70–99)

## 2019-10-22 LAB — TROPONIN I (HIGH SENSITIVITY)
Troponin I (High Sensitivity): 556 ng/L (ref <18)
Troponin I (High Sensitivity): 525 ng/L (ref <18)

## 2019-10-22 LAB — PREPARE RBC (CROSSMATCH)

## 2019-10-22 LAB — GLUCOSE, CAPILLARY: Glucose-Capillary: 100 mg/dL — ABNORMAL HIGH (ref 70–99)

## 2019-10-22 MED ORDER — INSULIN ASPART 100 UNIT/ML ~~LOC~~ SOLN: 0.0000 [IU] | Freq: Three times a day (TID) | SUBCUTANEOUS | Status: DC

## 2019-10-22 MED ORDER — AMLODIPINE BESYLATE 10 MG PO TABS: 10.0000 mg | ORAL_TABLET | Freq: Every day | ORAL | Status: DC

## 2019-10-22 MED ORDER — ACETAMINOPHEN 325 MG PO TABS
650.0000 mg | ORAL_TABLET | Freq: Four times a day (QID) | ORAL | Status: DC | PRN
  Administered 2019-10-22: 650 mg via ORAL
  Filled 2019-10-22: qty 2

## 2019-10-22 MED ORDER — ATORVASTATIN CALCIUM 10 MG PO TABS
10.0000 mg | ORAL_TABLET | Freq: Every day | ORAL | Status: DC
  Administered 2019-10-22: 10 mg via ORAL
  Filled 2019-10-22: qty 1

## 2019-10-22 MED ORDER — GABAPENTIN 100 MG PO CAPS
200.0000 mg | ORAL_CAPSULE | Freq: Three times a day (TID) | ORAL | Status: DC
  Administered 2019-10-22 – 2019-10-23 (×2): 200 mg via ORAL
  Filled 2019-10-22 (×2): qty 2

## 2019-10-22 MED ORDER — ACETAMINOPHEN 650 MG RE SUPP: 650.0000 mg | Freq: Four times a day (QID) | RECTAL | Status: DC | PRN

## 2019-10-22 MED ORDER — SODIUM CHLORIDE 0.9% IV SOLUTION: Freq: Once | INTRAVENOUS | Status: AC

## 2019-10-22 MED ORDER — CINACALCET HCL 30 MG PO TABS: 30.0000 mg | ORAL_TABLET | Freq: Two times a day (BID) | ORAL | Status: DC

## 2019-10-22 MED ORDER — METHOCARBAMOL 500 MG PO TABS
500.0000 mg | ORAL_TABLET | Freq: Three times a day (TID) | ORAL | Status: DC
  Administered 2019-10-22 – 2019-10-23 (×2): 500 mg via ORAL
  Filled 2019-10-22 (×3): qty 1

## 2019-10-22 MED ORDER — PANTOPRAZOLE SODIUM 40 MG PO TBEC
40.0000 mg | DELAYED_RELEASE_TABLET | Freq: Every day | ORAL | Status: DC
  Administered 2019-10-23: 40 mg via ORAL
  Filled 2019-10-22: qty 1

## 2019-10-22 MED ORDER — SEVELAMER CARBONATE 800 MG PO TABS
1600.0000 mg | ORAL_TABLET | Freq: Three times a day (TID) | ORAL | Status: DC
  Administered 2019-10-23: 1600 mg via ORAL
  Filled 2019-10-22 (×2): qty 2

## 2019-10-22 MED ORDER — POLYETHYLENE GLYCOL 3350 17 G PO PACK: 17.0000 g | PACK | Freq: Every day | ORAL | Status: DC | PRN

## 2019-10-22 MED ORDER — PREDNISONE 20 MG PO TABS: 10.0000 mg | ORAL_TABLET | Freq: Every day | ORAL | Status: DC

## 2019-10-22 MED ORDER — CALCIUM ACETATE (PHOS BINDER) 667 MG PO CAPS
1334.0000 mg | ORAL_CAPSULE | Freq: Three times a day (TID) | ORAL | Status: DC
  Administered 2019-10-22 – 2019-10-23 (×2): 1334 mg via ORAL
  Filled 2019-10-22 (×4): qty 2

## 2019-10-22 MED ORDER — RENA-VITE PO TABS
1.0000 | ORAL_TABLET | Freq: Every day | ORAL | Status: DC
  Administered 2019-10-23: 1 via ORAL
  Filled 2019-10-22 (×2): qty 1

## 2019-10-22 MED ORDER — ASPIRIN EC 81 MG PO TBEC
81.0000 mg | DELAYED_RELEASE_TABLET | Freq: Every day | ORAL | Status: DC
  Administered 2019-10-23: 81 mg via ORAL
  Filled 2019-10-22: qty 1

## 2019-10-22 MED ORDER — HYDRALAZINE HCL 25 MG PO TABS: 25.0000 mg | ORAL_TABLET | Freq: Four times a day (QID) | ORAL | Status: DC | PRN

## 2019-10-22 MED ORDER — TRAZODONE HCL 50 MG PO TABS
50.0000 mg | ORAL_TABLET | Freq: Every day | ORAL | Status: DC
  Administered 2019-10-22: 50 mg via ORAL
  Filled 2019-10-22: qty 1

## 2019-10-22 MED ORDER — CLONIDINE HCL 0.2 MG PO TABS
0.2000 mg | ORAL_TABLET | Freq: Two times a day (BID) | ORAL | Status: DC
  Administered 2019-10-22 – 2019-10-23 (×2): 0.2 mg via ORAL
  Filled 2019-10-22 (×2): qty 1

## 2019-10-22 MED ORDER — INSULIN GLARGINE 100 UNIT/ML ~~LOC~~ SOLN
10.0000 [IU] | Freq: Every day | SUBCUTANEOUS | Status: DC
  Administered 2019-10-23: 09:00:00 10 [IU] via SUBCUTANEOUS
  Filled 2019-10-22 (×2): qty 0.1

## 2019-10-22 MED ORDER — SERTRALINE HCL 100 MG PO TABS
100.0000 mg | ORAL_TABLET | Freq: Every day | ORAL | Status: DC
  Administered 2019-10-23: 100 mg via ORAL
  Filled 2019-10-22: qty 1

## 2019-10-22 NOTE — ED Notes (Signed)
Provider Posey Pronto notified of Hgb of 6.0

## 2019-10-22 NOTE — H&P (Signed)
History and Physical    Brandy Houston ZOX:[id number] DOB: 05/10/1956 DOA: 10/22/2019  PCP: [name], MD   Patient coming from: Home  I have personally briefly reviewed patient's old medical records in [place]  Chief Complaint: HD site bleeding  HPI: Brandy Houston is a 64 y.o. 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 She states that she was at dialysis earlier today and shortly after accessing her graft, needle slipped out and bleeding continued for "quite some time".  According to dialysis center's report, about 1 unit blood was lost. They wrapped the graft site and sent her to the emergency department.  Pt did have some short of breath and chest tightness before coming to the ED. But at the time I saw her, pt has no symptoms.  ED Course: Hb 6.0, Trop 556 and then 525, EKG no ST-T changes  Review of Systems: As per HPI otherwise 10 point review of systems negative.    Past Medical History:  Diagnosis Date  . Allergy   . Anemia   . Anxiety   . Arthritis    "in my joints" (03/10/2017)  . Chronic diastolic CHF (congestive heart failure) ([place])   . CKD (chronic kidney disease), stage IV ([place])    stage IV. previous HD, none currently 10/30/15 (confirmed 02/05/2017 & 03/10/2017)  . Depression    Chronic  . Diabetic peripheral neuropathy ([place])   . Dysrhythmia    tachycardia, normal ECHO 08-09-14  . GERD (gastroesophageal reflux disease)   . Heart murmur     dx'd 02/05/2017  . Hepatitis C    "tx'd in 2016; I'm negative now" (02/05/2017)  . History of blood transfusion 2017   "w/knee replacement"  . Hyperlipidemia   . Hypertension   . Osteoarthritis of left knee   . Peripheral neuropathy   . Protein calorie malnutrition ([place])   . Septic arthritis ([place])   . Slow transit constipation   . Type II diabetes mellitus ([name])    IDDM  . Uncontrolled hypertension 02/18/2013  . Unsteady gait     Past Surgical History:  Procedure Laterality Date  . A/V FISTULAGRAM Right 07/07/2018   Procedure: A/V FISTULAGRAM;  Surgeon: Marty Heck, MD;  Location: [facility];  Service: Cardiovascular;  Laterality: Right;  . AMPUTATION Left 03/20/2017   Procedure: LEFT ABOVE KNEE AMPUTATION;  Surgeon: Newt Minion, MD;  Location: [facility];  Service: Orthopedics;  Laterality: Left;  . APPENDECTOMY    . AV FISTULA PLACEMENT Left 09/13/2014   Procedure: Brachial Artery to Brachial Vein Gortex Four - Seven Stretch GRAFT INSERTION Left Forearm;  Surgeon: Mal Misty, MD;  Location: [facility];  Service: Vascular;  Laterality: Left;  . BASCILIC VEIN TRANSPOSITION Right 10/26/2017   Procedure: BASILIC VEIN TRANSPOSITION FIRST STAGE RIGHT ARM;  Surgeon: Conrad [place], MD;  Location: [facility];  Service: Vascular;  Laterality: Right;  . BASCILIC VEIN TRANSPOSITION Right 02/01/2018   Procedure: SECOND STAGE BASILIC VEIN TRANSPOSITION RIGHT UPPER EXTREMITY;  Surgeon: Marty Heck, MD;  Location: [facility];  Service: Vascular;  Laterality: Right;  . CHOLECYSTECTOMY OPEN    . COLON SURGERY    . COLONOSCOPY    . EXCISIONAL TOTAL KNEE ARTHROPLASTY WITH ANTIBIOTIC SPACERS Left 02/06/2017   Procedure: Incisional total iknee with antibiotic spacer ;  Surgeon: Leandrew Koyanagi, MD;  Location: [facility];  Service: Orthopedics;  Laterality: Left;  . IR FLUORO GUIDE CV LINE RIGHT  02/10/2017  . IR REMOVAL TUN CV CATH W/O FL  04/01/2017  . IR US GUIDE VASC ACCESS RIGHT  02/10/2017  . IRRIGATION AND DEBRIDEMENT KNEE Left 03/12/2017   Procedure: IRRIGATION AND DEBRIDEMENT LEFT KNEE WITH WOUND VAC APPLICATION;  Surgeon: Leandrew Koyanagi, MD;  Location: [facility];  Service: Orthopedics;  Laterality: Left;  . JOINT REPLACEMENT    . KNEE ARTHROSCOPY Right 08/10/2014   Procedure: ARTHROSCOPY I & D KNEE;  Surgeon: Marianna Payment, MD;  Location: [facility];  Service:  Orthopedics;  Laterality: Right;  . KNEE ARTHROSCOPY Left 08/11/2014   Procedure: ARTHROSCOPIC WASHOUT LEFT KNEE;  Surgeon: Marianna Payment, MD;  Location: [facility];  Service: Orthopedics;  Laterality: Left;  . KNEE ARTHROSCOPY Left 08/19/2014   Procedure: ARTHROSCOPIC WASHOUT LEFT KNEE;  Surgeon: Leandrew Koyanagi, MD;  Location: [place];  Service: Orthopedics;  Laterality: Left;  . KNEE ARTHROSCOPY WITH LATERAL MENISECTOMY Left 04/04/2015   Procedure: AND PARTIAL LATERAL MENISECTOMY;  Surgeon: Leandrew Koyanagi, MD;  Location: [place] [facility];  Service: Orthopedics;  Laterality: Left;  . KNEE ARTHROSCOPY WITH MEDIAL MENISECTOMY Left 04/04/2015   Procedure: LEFT KNEE ARTHROSCOPY WITH PARTIAL MEDIAL MENISCECTOMY  AND SYNOVECTOMY;  Surgeon: Leandrew Koyanagi, MD;  Location: [facility];  Service: Orthopedics;  Laterality: Left;  . PERIPHERAL VASCULAR BALLOON ANGIOPLASTY  07/07/2018   Procedure: PERIPHERAL VASCULAR BALLOON ANGIOPLASTY;  Surgeon: Marty Heck, MD;  Location: [facility];  Service: Cardiovascular;;  right AV fistula  . POLYPECTOMY    . SHOULDER ARTHROSCOPY Bilateral 08/10/2014   Procedure: I & D BILATERAL SHOULDERS ;  Surgeon: Marianna Payment, MD;  Location: [facility];  Service: Orthopedics;  Laterality: Bilateral;  . SMALL INTESTINE SURGERY     Due to Small Bowel Obstruction; "fixed it when they did my gallbladder OR"  . TEE WITHOUT CARDIOVERSION N/A 08/14/2014   Procedure: TRANSESOPHAGEAL ECHOCARDIOGRAM (TEE);  Surgeon: Thayer Headings, MD;  Location: [facility];  Service: Cardiovascular;  Laterality: N/A;  . TENOSYNOVECTOMY Right 08/11/2014   Procedure: RIGHT WRIST IRRIGATION AND DEBRIDEMENT, TENOSYNOVECTOMY;  Surgeon: Marianna Payment, MD;  Location: [facility];  Service: Orthopedics;  Laterality: Right;  . TOTAL KNEE ARTHROPLASTY Left 11/07/2015   Procedure: LEFT TOTAL KNEE ARTHROPLASTY WITH REVISION OF IMPLANTS;  Surgeon: Leandrew Koyanagi, MD;  Location: [facility];   Service: Orthopedics;  Laterality: Left;  . TUBAL LIGATION       reports that she has been smoking cigarettes. She has a 17.50 pack-year smoking history. She has never used smokeless tobacco. She reports previous alcohol use. She reports that she does not use drugs.  Allergies  Allergen Reactions  . Compazine [Prochlorperazine] Shortness Of Breath, Swelling and Other (See Comments)    TONGUE SWELLS  . Shellfish-Derived Products Anaphylaxis  . Iodinated Diagnostic Agents Hives and Rash  . Omnipaque [Iohexol] Hives  . Tape     Brown paper tape caused skin irritation  . Sulfa Antibiotics Rash    Family History  Problem Relation Age of Onset  . Cancer Mother   . Uterine cancer Mother   . Heart disease Father   . Cancer Sister   . Hypertension Sister   . Uterine cancer Sister   . Cancer Brother   . Diabetes Brother   . Hypertension Brother   . Colon  cancer Neg Hx   . Colon polyps Neg Hx   . Esophageal cancer Neg Hx   . Rectal cancer Neg Hx   . Stomach cancer Neg Hx      Prior to Admission medications   Medication Sig Start Date End Date Taking? Authorizing Provider  Accu-Chek Softclix Lancets lancets Check sugars three times a day for E11.65 09/20/18   [name], MD  amLODipine (NORVASC) 10 MG tablet Take 1 tablet (10 mg total) by mouth at bedtime. 03/31/17   Angiulli, Lavon Paganini, PA-C  aspirin EC 81 MG tablet Take 81 mg by mouth daily.    [provider]  atorvastatin (LIPITOR) 10 MG tablet Take 1 tablet (10 mg total) by mouth at bedtime. Patient needs to call our office to schedule a appointment for further [phone] 10/03/19   [name], MD  blood glucose meter kit and supplies KIT Dispense based on patient and insurance preference. Use up to four times daily as directed 10/13/19   [name], MD  Blood Glucose Monitoring Suppl (ACCU-CHEK GUIDE) w/Device KIT 1 Device by Does not apply route QID. Check blood sugar up to 4 times a day 10/13/19    [name], MD  calcium acetate (PHOSLO) 667 MG capsule Take 2 capsules by mouth 3 (three) times daily. 01/13/19   [provider]  Chlorpheniramine Maleate (ALLERGY PO) Take 1 tablet by mouth daily as needed (allergies).    [provider]  cinacalcet (SENSIPAR) 30 MG tablet Take by mouth. 12/11/18 12/10/19  [provider]  cloNIDine (CATAPRES) 0.2 MG tablet Take 0.2 mg by mouth 2 (two) times daily.    [provider]  Ferrous Sulfate (IRON PO) Take by mouth.    [provider]  gabapentin (NEURONTIN) 100 MG capsule Take 200 mg by mouth 3 (three) times daily. 11/01/18   [provider]  glucose blood (ACCU-CHEK GUIDE) test strip Check blood sugar up to 4 times a day 10/20/19   [name], MD  hydrALAZINE (APRESOLINE) 100 MG tablet Take 100 mg by mouth as directed. Take 1 tablet TID ONLY ON (SUN MON, WED, FRI)    [provider]  Insulin Glargine (LANTUS SOLOSTAR) 100 UNIT/ML Solostar Pen Inject 10 Units into the skin daily. 02/28/19   [name], MD  Lancet Devices [facility]) lancets Check sugars TID for E11.65 01/29/15   Chari Manning A, NP  lidocaine-prilocaine (EMLA) cream Apply 1 application topically once.  10/25/18   [provider]  methocarbamol (ROBAXIN) 500 MG tablet  06/08/19   [provider]  Methoxy PEG-Epoetin Beta (MIRCERA IJ) Mircera 06/30/19 06/28/20  [provider]  multivitamin (RENA-VIT) TABS tablet Take 1 tablet by mouth daily. 03/01/19   [provider]  omeprazole (PRILOSEC) 20 MG capsule Take 1 capsule (20 mg total) by mouth daily before breakfast. Patient needs to call our office to schedule a appointment for further [phone]) 10/03/19   [name], MD  polyethylene glycol (MIRALAX / GLYCOLAX) 17 g packet Take 17 g by mouth daily as needed for moderate constipation. 12/10/18   [name], MD  predniSONE (DELTASONE) 10 MG tablet Take 1 tablet  (10 mg total) by mouth daily with breakfast. 05/25/19   Persons, Bevely Palmer, PA  sertraline (ZOLOFT) 100 MG tablet Take 1 tablet (100 mg total) by mouth daily. 11/05/18   [name], MD  sertraline (ZOLOFT) 50 MG tablet Take 50 mg by mouth daily.  01/18/19   [provider]  sevelamer carbonate (RENVELA) 800 MG tablet Take 1,600 mg by mouth 3 (three) times daily with meals.    [provider]  traZODone (DESYREL) 50 MG tablet Take 50 mg by mouth at bedtime. 10/27/18   [provider]    Physical Exam: Vitals:   10/22/19 1825 10/22/19 1837 10/22/19 1845 10/22/19 1900  BP:  122/60    Pulse:  80 81 81  Resp:  '14 17 17  ' Temp: 98.6 F (37 C)     TempSrc: Oral     SpO2:  96% 100% 95%  Weight:        Constitutional: NAD, calm, comfortable Vitals:   10/22/19 1825 10/22/19 1837 10/22/19 1845 10/22/19 1900  BP:  122/60    Pulse:  80 81 81  Resp:  '14 17 17  ' Temp: 98.6 F (37 C)     TempSrc: Oral     SpO2:  96% 100% 95%  Weight:       Eyes: PERRL, lids and conjunctivae normal ENMT: Mucous membranes are moist. Posterior pharynx clear of any exudate or lesions.Normal dentition.  Neck: normal, supple, no masses, no thyromegaly Respiratory: clear to auscultation bilaterally, no wheezing, no crackles. Normal respiratory effort. No accessory muscle use.  Cardiovascular: Regular rate and rhythm, soft murmurs on heart base. No extremity edema. 2+ pedal pulses. No carotid bruits.  Abdomen: no tenderness, no masses palpated. No hepatosplenomegaly. Bowel sounds positive.  Musculoskeletal: no clubbing / cyanosis. No joint deformity upper and lower extremities. Good ROM, no contractures. Normal muscle tone.  Skin: no rashes, lesions, ulcers. No induration Neurologic: CN 2-12 grossly intact. Sensation intact, DTR normal. Strength 5/5 in all 4.  Psychiatric: Normal judgment and insight. Alert and oriented x 3. Normal mood.     Labs on Admission: I have personally reviewed  following labs and imaging studies  CBC: Recent Labs  Lab 10/22/19 1607  WBC 4.0  NEUTROABS 2.8  HGB 6.0*  HCT 20.5*  MCV 98.1  PLT 630   Basic Metabolic Panel: Recent Labs  Lab 10/22/19 1607  NA 138  K 3.0*  CL 98  CO2 29  GLUCOSE 122*  BUN 18  CREATININE 3.07*  CALCIUM 7.7*   GFR: Estimated Creatinine Clearance: 17.5 mL/min (A) (by C-G formula based on SCr of 3.07 mg/dL (H)). Liver Function Tests: Recent Labs  Lab 10/22/19 1607  AST 14*  ALT 20  ALKPHOS 46  BILITOT 0.7  PROT 6.1*  ALBUMIN 3.0*   No results for input(s): LIPASE, AMYLASE in the last 168 hours. No results for input(s): AMMONIA in the last 168 hours. Coagulation Profile: No results for input(s): INR, PROTIME in the last 168 hours. Cardiac Enzymes: No results for input(s): CKTOTAL, CKMB, CKMBINDEX, TROPONINI in the last 168 hours. BNP (last 3 results) No results for input(s): PROBNP in the last 8760 hours. HbA1C: No results for input(s): HGBA1C in the last 72 hours. CBG: No results for input(s): GLUCAP in the last 168 hours. Lipid Profile: No results for input(s): CHOL, HDL, LDLCALC, TRIG, CHOLHDL, LDLDIRECT in the last 72 hours. Thyroid Function Tests: No results for input(s): TSH, T4TOTAL, FREET4, T3FREE, THYROIDAB in the last 72 hours. Anemia Panel: No results for input(s): VITAMINB12, FOLATE, FERRITIN, TIBC, IRON, RETICCTPCT in the last 72 hours. Urine analysis:    Component Value Date/Time   COLORURINE AMBER (A) 02/08/2017 1138   APPEARANCEUR TURBID (A) 02/08/2017 1138   LABSPEC 1.014 02/08/2017 1138   PHURINE 5.0  02/08/2017 [place] 02/08/2017 1138   HGBUR MODERATE (A) 02/08/2017 1138   BILIRUBINUR NEGATIVE 02/08/2017 1138   [place] 02/08/2017 1138   PROTEINUR 100 (A) 02/08/2017 1138   UROBILINOGEN 0.2 09/02/2014 1504   NITRITE NEGATIVE 02/08/2017 1138   LEUKOCYTESUR LARGE (A) 02/08/2017 1138    Radiological Exams on Admission: DG Chest Port 1  View  Result Date: 10/22/2019 CLINICAL DATA:  Chest tightness. EXAM: PORTABLE CHEST 1 VIEW COMPARISON:  06/05/2019 FINDINGS: Stable cardiomegaly. Atherosclerotic calcification of the aortic arch. No pulmonary edema. The lungs appear clear. No blunting of the costophrenic angles. IMPRESSION: 1. Stable cardiomegaly. 2. No acute findings. 3. Atherosclerotic calcification of the aortic arch. Electronically Signed   By: Van Clines M.D.   On: 10/22/2019 16:09    EKG: Independently reviewed. No acute ST-T changes Assessment/Plan Active Problems:   Anemia, blood loss   Anemia  Acute blood loss anemia -Agreed with 2 units PRBC tonight -Monitor volume status -Expect overnight observation, if Hb stable and no signs of fluid overload, pt likely can go home in AM  Chest pain with positive troponin Likely demanding ischemia Cardiology was contacted by ED physician Trop pattern is flat and EKG no acute changes, likely from acute anemia. Noted that pt told me she had a normal stress test in Oct 2020 quoting: Essentially normal stress test. There was a small fixed perfusion defect at the apex, but given normal wall motion on echo, suspect that this an artifact.  HTN -Start BP meds tonight -PRN Hydralazine for tonight  ESRD -No significant uremia, acidosis, fluid overload or electrolytes problem, resume regular HD schedule as outpt.  IDDM Sliding scale     DVT prophylaxis: Ambulation Code Status: Full Family Communication: None at bedside Disposition Plan: Likely can home in AM Consults called: Cardio Admission status: Tele Obs   Lequita Halt MD [facility] Pager [phone]    10/22/2019, 7:17 PM

## 2019-10-22 NOTE — ED Provider Notes (Signed)
[facility] Provider Note   CSN: [id number] Arrival date & time: 10/22/19  1432     History No chief complaint on file.   Brandy Houston is a 64 y.o. female pertinent past medical history of hyperlipidemia, hypertension, ESRD on HD, CHF, anemia, diabetes, that presents to the emergency department today for a blood transfusion.  She states that she was at dialysis earlier today and shortly after accessing her graft, needle popped out and patient states that they told her she lost a lot of blood.  They wrapped the graft site and sent her to the emergency department.  In regards to her dialysis, she goes every Tuesday Thursday Saturday.  States that she has not missed a session.  Graft was placed 2 years ago.  No issues with graft.  Patient states that she has had to get blood transfusion before due to this happening previously.  Patient states that she is asymptomatic and feels fine, however when going through ROS patient admits to chest tightness and upper jaw pain bilaterally.  Patient states that her chest tightness has been occurring for many months, she has seen cardiology about 2 months ago about this.  In regards to her jaw pain she does states that she is cramping, which is normal for her after dialysis.  States that her legs are also cramping.  In regards to chest pain, patient states that it is nonexertional, pain is bilaterally under her chest, and pain does not radiate anywhere.  Denies any shortness of breath, palpitations, nausea, vomiting, diarrhea, diaphoresis, leg swelling, neck pain, back pain, abdominal pain.   Patient states that she is compliant with her medications.  No anticoagulation. Nomral Hgb is 8.6.  Per chart reviews she see Dr. Gardiner Rhyme for cardiology.  She was seen by him in February of this year and had been complaining for chest pain since October 2020.  Typically occurs after hemodialysis and described as a cramping pain and lasts  about 5 to 10 minutes.  Lexiscan Myoview was done on 04/15/2019, which showed EF of 51%, no ST segment deviation during stress, small fixed defect at apex, otherwise no abnormalities, no ischemia identified..  TTE on 04/13/2019 showed mild aortic stenosis, moderate aortic regurgitation, normal EF, grade 2 diastolic dysfunction, normal RV function.   HPI     Past Medical History:  Diagnosis Date   Allergy    Anemia    Anxiety    Arthritis    "in my joints" (03/10/2017)   Chronic diastolic CHF (congestive heart failure) ([place])    CKD (chronic kidney disease), stage IV ([place])    stage IV. previous HD, none currently 10/30/15 (confirmed 02/05/2017 & 03/10/2017)   Depression    Chronic   Diabetic peripheral neuropathy ([place])    Dysrhythmia    tachycardia, normal ECHO 08-09-14   GERD (gastroesophageal reflux disease)    Heart murmur     dx'd 02/05/2017   Hepatitis C    "tx'd in 2016; I'm negative now" (02/05/2017)   History of blood transfusion 2017   "w/knee replacement"   Hyperlipidemia    Hypertension    Osteoarthritis of left knee    Peripheral neuropathy    Protein calorie malnutrition ([name])    Septic arthritis ([name])    Slow transit constipation    Type II diabetes mellitus ([place])    IDDM   Uncontrolled hypertension 02/18/2013   Unsteady gait     Patient Active Problem List   Diagnosis  Date Noted   Acute respiratory failure with hypoxia ([place]) 06/05/2019   Acute pulmonary edema ([place]) 06/05/2019   Hypertensive urgency 06/05/2019   QT prolongation 06/05/2019   On statin therapy due to risk of future cardiovascular event 12/10/2018   Benign essential HTN    Stage 5 chronic kidney disease not on chronic dialysis ([place])    Above knee amputation status, left    Anemia, blood loss    DM type 2 causing CKD stage 5 ([place]) 03/21/2017   Anemia of chronic disease 02/05/2017   Dysplasia of cervix, high grade CIN 2 01/12/2017   Chronic diastolic CHF  (congestive heart failure) ([place]) 11/08/2015   Total knee replacement status    Diabetic peripheral neuropathy ([name])    Major depression, recurrent, chronic ([name])    Chronic kidney disease (CKD), stage IV (severe) ([place])    Hepatitis C 08/16/2014   ESRD (end stage renal disease) on dialysis [facility])    Peripheral neuropathy 06/24/2013   Compulsive tobacco user syndrome 06/24/2013    Past Surgical History:  Procedure Laterality Date   A/V FISTULAGRAM Right 07/07/2018   Procedure: A/V FISTULAGRAM;  Surgeon: Marty Heck, MD;  Location: [facility];  Service: Cardiovascular;  Laterality: Right;   AMPUTATION Left 03/20/2017   Procedure: LEFT ABOVE KNEE AMPUTATION;  Surgeon: Newt Minion, MD;  Location: [facility];  Service: Orthopedics;  Laterality: Left;   APPENDECTOMY     AV FISTULA PLACEMENT Left 09/13/2014   Procedure: Brachial Artery to Brachial Vein Gortex Four - Seven Stretch GRAFT INSERTION Left Forearm;  Surgeon: Mal Misty, MD;  Location: [facility];  Service: Vascular;  Laterality: Left;   [place] Right 10/26/2017   Procedure: BASILIC VEIN TRANSPOSITION FIRST STAGE RIGHT ARM;  Surgeon: Conrad Car[place], MD;  Location: [facility];  Service: Vascular;  Laterality: Right;   [place] Right 02/01/2018   Procedure: SECOND STAGE BASILIC VEIN TRANSPOSITION RIGHT UPPER EXTREMITY;  Surgeon: Marty Heck, MD;  Location: [facility];  Service: Vascular;  Laterality: Right;   CHOLECYSTECTOMY OPEN     COLON SURGERY     COLONOSCOPY     EXCISIONAL TOTAL KNEE ARTHROPLASTY WITH ANTIBIOTIC SPACERS Left 02/06/2017   Procedure: Incisional total iknee with antibiotic spacer ;  Surgeon: Leandrew Koyanagi, MD;  Location: [facility];  Service: Orthopedics;  Laterality: Left;   IR FLUORO GUIDE CV LINE RIGHT  02/10/2017   IR REMOVAL TUN CV CATH W/O FL  04/01/2017   IR US GUIDE VASC ACCESS RIGHT  02/10/2017   IRRIGATION AND DEBRIDEMENT KNEE Left 03/12/2017    Procedure: IRRIGATION AND DEBRIDEMENT LEFT KNEE WITH WOUND VAC APPLICATION;  Surgeon: Leandrew Koyanagi, MD;  Location: [facility];  Service: Orthopedics;  Laterality: Left;   JOINT REPLACEMENT     KNEE ARTHROSCOPY Right 08/10/2014   Procedure: ARTHROSCOPY I & D KNEE;  Surgeon: Marianna Payment, MD;  Location: [facility];  Service: Orthopedics;  Laterality: Right;   KNEE ARTHROSCOPY Left 08/11/2014   Procedure: ARTHROSCOPIC WASHOUT LEFT KNEE;  Surgeon: Marianna Payment, MD;  Location: [facility];  Service: Orthopedics;  Laterality: Left;   KNEE ARTHROSCOPY Left 08/19/2014   Procedure: ARTHROSCOPIC WASHOUT LEFT KNEE;  Surgeon: Leandrew Koyanagi, MD;  Location: [facility];  Service: Orthopedics;  Laterality: Left;   KNEE ARTHROSCOPY WITH LATERAL MENISECTOMY Left 04/04/2015   Procedure: AND PARTIAL LATERAL MENISECTOMY;  Surgeon: Leandrew Koyanagi, MD;  Location: [facility];  Service: Orthopedics;  Laterality: Left;   KNEE ARTHROSCOPY WITH MEDIAL MENISECTOMY Left 04/04/2015   Procedure: LEFT KNEE ARTHROSCOPY WITH PARTIAL MEDIAL MENISCECTOMY  AND SYNOVECTOMY;  Surgeon: Leandrew Koyanagi, MD;  Location: [facility];  Service: Orthopedics;  Laterality: Left;   PERIPHERAL VASCULAR BALLOON ANGIOPLASTY  07/07/2018   Procedure: PERIPHERAL VASCULAR BALLOON ANGIOPLASTY;  Surgeon: Marty Heck, MD;  Location: [facility];  Service: Cardiovascular;;  right AV fistula   POLYPECTOMY     SHOULDER ARTHROSCOPY Bilateral 08/10/2014   Procedure: I & D BILATERAL SHOULDERS ;  Surgeon: Marianna Payment, MD;  Location: [facility];  Service: Orthopedics;  Laterality: Bilateral;   SMALL INTESTINE SURGERY     Due to Small Bowel Obstruction; "fixed it when they did my gallbladder OR"   TEE WITHOUT CARDIOVERSION N/A 08/14/2014   Procedure: TRANSESOPHAGEAL ECHOCARDIOGRAM (TEE);  Surgeon: Thayer Headings, MD;  Location: [facility];  Service: Cardiovascular;  Laterality: N/A;   TENOSYNOVECTOMY Right 08/11/2014    Procedure: RIGHT WRIST IRRIGATION AND DEBRIDEMENT, TENOSYNOVECTOMY;  Surgeon: Marianna Payment, MD;  Location: [facility];  Service: Orthopedics;  Laterality: Right;   TOTAL KNEE ARTHROPLASTY Left 11/07/2015   Procedure: LEFT TOTAL KNEE ARTHROPLASTY WITH REVISION OF IMPLANTS;  Surgeon: Leandrew Koyanagi, MD;  Location: [facility];  Service: Orthopedics;  Laterality: Left;   TUBAL LIGATION       OB History    Gravida  5   Para  0   Term  0   Preterm      AB  2   Living  3     SAB  1   TAB  1   Ectopic      Multiple      Live Births              Family History  Problem Relation Age of Onset   Cancer Mother    Uterine cancer Mother    Heart disease Father    Cancer Sister    Hypertension Sister    Uterine cancer Sister    Cancer Brother    Diabetes Brother    Hypertension Brother    Colon cancer Neg Hx    Colon polyps Neg Hx    Esophageal cancer Neg Hx    Rectal cancer Neg Hx    Stomach cancer Neg Hx     Social History   Tobacco Use   Smoking status: Current Every Day Smoker    Packs/day: 0.50    Years: 35.00    Pack years: 17.50    Types: Cigarettes   Smokeless tobacco: Never Used  Substance Use Topics   Alcohol use: Not Currently    Alcohol/week: 0.0 standard drinks    Comment: 03/10/2017 "I drink a wine cooler a few times/year"   Drug use: No    Home Medications Prior to Admission medications   Medication Sig Start Date End Date Taking? Authorizing Provider  Accu-Chek Softclix Lancets lancets Check sugars three times a day for E11.65 09/20/18   [name], MD  amLODipine (NORVASC) 10 MG tablet Take 1 tablet (10 mg total) by mouth at bedtime. 03/31/17   Angiulli, Lavon Paganini, PA-C  aspirin EC 81 MG tablet Take 81 mg by mouth daily.    [provider]  atorvastatin (LIPITOR) 10 MG tablet Take 1 tablet (10 mg total) by mouth at bedtime. Patient needs to call our office to schedule a appointment for further [phone]  10/03/19   [name],  MD  blood glucose meter kit and supplies KIT Dispense based on patient and insurance preference. Use up to four times daily as directed 10/13/19   [name], MD  Blood Glucose Monitoring Suppl (ACCU-CHEK GUIDE) w/Device KIT 1 Device by Does not apply route QID. Check blood sugar up to 4 times a day 10/13/19   [name], MD  calcium acetate (PHOSLO) 667 MG capsule Take 2 capsules by mouth 3 (three) times daily. 01/13/19   [provider]  Chlorpheniramine Maleate (ALLERGY PO) Take 1 tablet by mouth daily as needed (allergies).    [provider]  cinacalcet (SENSIPAR) 30 MG tablet Take by mouth. 12/11/18 12/10/19  [provider]  cloNIDine (CATAPRES) 0.2 MG tablet Take 0.2 mg by mouth 2 (two) times daily.    [provider]  Ferrous Sulfate (IRON PO) Take by mouth.    [provider]  gabapentin (NEURONTIN) 100 MG capsule Take 200 mg by mouth 3 (three) times daily. 11/01/18   [provider]  glucose blood (ACCU-CHEK GUIDE) test strip Check blood sugar up to 4 times a day 10/20/19   [name], MD  hydrALAZINE (APRESOLINE) 100 MG tablet Take 100 mg by mouth as directed. Take 1 tablet TID ONLY ON (SUN MON, WED, FRI)    [provider]  Insulin Glargine (LANTUS SOLOSTAR) 100 UNIT/ML Solostar Pen Inject 10 Units into the skin daily. 02/28/19   [name], MD  Lancet Devices [facility]) lancets Check sugars TID for E11.65 01/29/15   Chari Manning A, NP  lidocaine-prilocaine (EMLA) cream Apply 1 application topically once.  10/25/18   [provider]  methocarbamol (ROBAXIN) 500 MG tablet  06/08/19   [provider]  Methoxy PEG-Epoetin Beta (MIRCERA IJ) Mircera 06/30/19 06/28/20  [provider]  multivitamin (RENA-VIT) TABS tablet Take 1 tablet by mouth daily. 03/01/19   [provider]  omeprazole (PRILOSEC) 20 MG capsule Take 1 capsule (20 mg total) by mouth  daily before breakfast. Patient needs to call our office to schedule a appointment for further refills([phone]) 10/03/19   [name], MD  polyethylene glycol (MIRALAX / GLYCOLAX) 17 g packet Take 17 g by mouth daily as needed for moderate constipation. 12/10/18   [name], MD  predniSONE (DELTASONE) 10 MG tablet Take 1 tablet (10 mg total) by mouth daily with breakfast. 05/25/19   Persons, Bevely Palmer, PA  sertraline (ZOLOFT) 100 MG tablet Take 1 tablet (100 mg total) by mouth daily. 11/05/18   [name], MD  sertraline (ZOLOFT) 50 MG tablet Take 50 mg by mouth daily. 01/18/19   [provider]  sevelamer carbonate (RENVELA) 800 MG tablet Take 1,600 mg by mouth 3 (three) times daily with meals.    [provider]  traZODone (DESYREL) 50 MG tablet Take 50 mg by mouth at bedtime. 10/27/18   [provider]    Allergies    Compazine [prochlorperazine], Shellfish-derived products, Iodinated diagnostic agents, Omnipaque [iohexol], Tape, and Sulfa antibiotics  Review of Systems   Review of Systems  Constitutional: Negative for chills, diaphoresis, fatigue and fever.  HENT: Negative for congestion, sore throat and trouble swallowing.   Eyes: Negative for pain and visual disturbance.  Respiratory: Positive for chest tightness. Negative for cough, shortness of breath and wheezing.   Cardiovascular: Negative for chest pain, palpitations and leg swelling.  Gastrointestinal: Negative for abdominal distention, abdominal pain, diarrhea, nausea and vomiting.  Genitourinary: Negative for difficulty  urinating.  Musculoskeletal: Positive for myalgias (Cramping sensation). Negative for back pain, neck pain and neck stiffness.  Skin: Negative for pallor.  Neurological: Negative for dizziness, speech difficulty, weakness and headaches.  Psychiatric/Behavioral: Negative for confusion.    Physical Exam Updated Vital Signs BP (!) 128/100    Pulse 84    Temp 98 F (36.7  C) (Oral)    Resp 20    Wt 60.7 kg    LMP 03/16/2002    SpO2 98%    BMI 20.96 kg/m   Physical Exam Constitutional:      General: She is not in acute distress.    Appearance: Normal appearance. She is not ill-appearing, toxic-appearing or diaphoretic.  HENT:     Mouth/Throat:     Mouth: Mucous membranes are moist.     Pharynx: Oropharynx is clear.  Eyes:     General: No scleral icterus.    Extraocular Movements: Extraocular movements intact.     Pupils: Pupils are equal, round, and reactive to light.  Cardiovascular:     Rate and Rhythm: Normal rate and regular rhythm.     Pulses: Normal pulses.     Heart sounds: Murmur (Systolic ) present. No friction rub. No gallop.   Pulmonary:     Effort: Pulmonary effort is normal. No respiratory distress.     Breath sounds: Normal breath sounds. No stridor. No wheezing, rhonchi or rales.  Chest:     Chest wall: No tenderness.  Abdominal:     General: Abdomen is flat. There is no distension.     Palpations: Abdomen is soft.     Tenderness: There is no abdominal tenderness. There is no guarding or rebound.  Musculoskeletal:        General: No swelling or tenderness. Normal range of motion.     Cervical back: Normal range of motion and neck supple. No rigidity.     Right lower leg: No edema.     Left lower leg: No edema.  Skin:    General: Skin is warm and dry.     Capillary Refill: Capillary refill takes less than 2 seconds.     Coloration: Skin is not pale.     Comments: Graft on right upper arm, thrill appreciated   Neurological:     General: No focal deficit present.     Mental Status: She is alert and oriented to person, place, and time.     Cranial Nerves: No cranial nerve deficit.     Sensory: No sensory deficit.     Motor: No weakness.     Coordination: Coordination normal.  Psychiatric:        Mood and Affect: Mood normal.        Behavior: Behavior normal.        Thought Content: Thought content normal.        Judgment:  Judgment normal.     ED Results / Procedures / Treatments   Labs (all labs ordered are listed, but only abnormal results are displayed) Labs Reviewed  CBC WITH DIFFERENTIAL/PLATELET - Abnormal; Notable for the following components:      Result Value   RBC 2.09 (*)    Hemoglobin 6.0 (*)    HCT 20.5 (*)    MCHC 29.3 (*)    RDW 17.2 (*)    All other components within normal limits  COMPREHENSIVE METABOLIC PANEL - Abnormal; Notable for the following components:   Potassium 3.0 (*)    Glucose, Bld 122 (*)  Creatinine, Ser 3.07 (*)    Calcium 7.7 (*)    Total Protein 6.1 (*)    Albumin 3.0 (*)    AST 14 (*)    GFR calc non Af Amer 15 (*)    GFR calc Af Amer 18 (*)    All other components within normal limits  TROPONIN I (HIGH SENSITIVITY) - Abnormal; Notable for the following components:   Troponin I (High Sensitivity) 556 (*)    All other components within normal limits  TYPE AND SCREEN  PREPARE RBC (CROSSMATCH)  TROPONIN I (HIGH SENSITIVITY)    EKG EKG Interpretation  Date/Time:  Saturday Oct 22 2019 15:18:20 EDT Ventricular Rate:  87 PR Interval:    QRS Duration: 91 QT Interval:  510 QTC Calculation: 614 R Axis:   51 Text Interpretation: Sinus rhythm Borderline repolarization abnormality Prolonged QT interval Since last tracing rate slower Confirmed by Noemi Chapel [phone]) on 10/22/2019 3:35:13 PM   Radiology DG Chest Port 1 View  Result Date: 10/22/2019 CLINICAL DATA:  Chest tightness. EXAM: PORTABLE CHEST 1 VIEW COMPARISON:  06/05/2019 FINDINGS: Stable cardiomegaly. Atherosclerotic calcification of the aortic arch. No pulmonary edema. The lungs appear clear. No blunting of the costophrenic angles. IMPRESSION: 1. Stable cardiomegaly. 2. No acute findings. 3. Atherosclerotic calcification of the aortic arch. Electronically Signed   By: Van Clines M.D.   On: 10/22/2019 16:09    Procedures .Critical Care Performed by: Alfredia Client, PA-C Authorized by:  Alfredia Client, PA-C   Critical care provider statement:    Critical care time (minutes):  45   Critical care was necessary to treat or prevent imminent or life-threatening deterioration of the following conditions: Blood transfusion.   Critical care was time spent personally by me on the following activities:  Discussions with consultants, evaluation of patient's response to treatment, examination of patient, ordering and performing treatments and interventions, ordering and review of laboratory studies, ordering and review of radiographic studies, pulse oximetry, re-evaluation of patient's condition, obtaining history from patient or surrogate and review of old charts   (including critical care time)  Medications Ordered in ED Medications  0.9 %  sodium chloride infusion (Manually program via Guardrails IV Fluids) (has no administration in time range)  gabapentin (NEURONTIN) capsule 200 mg (has no administration in time range)    ED Course  I have reviewed the triage vital signs and the nursing notes.  Pertinent labs & imaging results that were available during my care of the patient were reviewed by me and considered in my medical decision making (see chart for details).  Clinical Course as of Oct 21 1824  Sat Oct 22, 2019  1700 Hgb 6. Ordered 2 L of blood. Reacssed  pt, still asymptomatic.    [SP]  1741 Troponin 556, consulted cardiology    [SP]    Clinical Course User Index [SP] Alfredia Client, PA-C   MDM Rules/Calculators/A&P                     Hemoglobin  Date Value Ref Range Status  10/22/2019 6.0 (LL) 12.0 - 15.0 g/dL Final    Comment:    REPEATED TO VERIFY THIS CRITICAL RESULT HAS VERIFIED AND BEEN CALLED TO J.BLUE,RN BY PAMELA HENDERSON ON 05 08 2021 AT [id number], AND HAS BEEN READ BACK.    06/06/2019 8.7 (L) 12.0 - 15.0 g/dL Final  06/05/2019 8.5 (L) 12.0 - 15.0 g/dL Final  06/05/2019 9.9 (L) 12.0 - 15.0 g/dL Final  02/03/2017 7.7 (  L) 11.1 - 15.9 g/dL Final   HGB    Date Value Ref Range Status  12/29/2016 11.7 11.6 - 15.9 g/dL Final   Brandy Houston is a 64 y.o. female pertinent past medical history of hyperlipidemia, hypertension, ESRD on dHD, CHF, anemia, diabetes, that presents to the emergency department today for a blood transfusion. Pt is complaining of chest pain and jaw pain which she normally gets after dialysis.  Patient with critical lab values of hemoglobin of 6. Will order 2 L of blood at this time.  Patient's normal hemoglobin around 8.6.  First, troponin 556, will consult cardiology.  Differential to include stress ischemia due to blood loss or NSTEMI.  Will consult hospitalist due to pt's anemia and need for observation since she is receiving blood. CMP stable, creatinine at baseline.   . 6:26 PM discussed case with, Dr. Roosevelt Locks, hospitalist who agrees to accept care of patient, told him I would consult cardiology due to troponin and call back. Paged cardiology twice (one hour apart) with no call back. 8:26 Pt has been removed OTF and in Dr. Delories Heinz care.   The patient appears reasonably stabilized for admission considering the current resources, flow, and capabilities available in the ED at this time, and I doubt any other [facility] requiring further screening and/or treatment in the ED prior to admission.  Final Clinical Impression(s) / ED Diagnoses Final diagnoses:  None    Rx / DC Orders ED Discharge Orders    None       Alfredia Client, PA-C 10/23/19 1505    Noemi Chapel, MD 10/24/19 1440

## 2019-10-22 NOTE — ED Triage Notes (Signed)
Pt went to dialysis this morning, shortly after accessing her graft, needle popped out and pt "lost a lot of blood".  Dialysis was completed to dry weight, 60.7 and pt was sent to ED to get blood transfusion d/t blood loss.  No bleeding at graft site on right upper arm.

## 2019-10-22 NOTE — ED Notes (Signed)
Tele Dinner ordered 

## 2019-10-22 NOTE — ED Provider Notes (Signed)
This patient presents to the ED for concern of anemia from blood loss when she was at dialysis, dialysis fistula was accessed in the right upper extremity, the needle came out and she bled approximately 1 unit according to the patient's report, she was feeling a little bit dizzy but not having any symptoms at this time, this involves an extensive number of treatment options, and is a complaint that carries with it a high risk of complications and morbidity.  The differential diagnosis includes acute blood loss anemia on top of chronic anemia  I discussed care with the patient, she agrees to have her blood rechecked in 4 hours to see if she has dropped significantly but at this time with normal vital signs and asymptomatic I do not see the need to give her a blood transfusion unless she has had a significant drop in her hemoglobin from previous.  She is agreeable to the plan   On my exam the patient has a good thrill in the fistula, no overlying redness, no drainage  Lab Tests:  I Ordered, reviewed, and interpreted labs, which included CBC, type and screen   Additional history obtained:  Additional history obtained from medical records Previous records obtained and reviewed, shows prior transfusions for anemia  Consultations Obtained:  I consulted with the patient and discussed lab and imaging findings  Reevaluation:  After the interventions stated above, I reevaluated the patient and found anemic, requires admission for transfusion due to level of anemia  Critical Interventions:  Transfusion  Medical screening examination/treatment/procedure(s) were conducted as a shared visit with non-physician practitioner(s) and myself.  I personally evaluated the patient during the encounter.  Clinical Impression:   Final diagnoses:  Anemia, unspecified type         Noemi Chapel, MD 10/24/19 1440

## 2019-10-22 NOTE — ED Notes (Signed)
Pt declined lantus. cbg 85

## 2019-10-23 DIAGNOSIS — D500 Iron deficiency anemia secondary to blood loss (chronic): Secondary | ICD-10-CM | POA: Diagnosis not present

## 2019-10-23 LAB — CBC
HCT: 27.9 % — ABNORMAL LOW (ref 36.0–46.0)
Hemoglobin: 8.6 g/dL — ABNORMAL LOW (ref 12.0–15.0)
MCH: 29.3 pg (ref 26.0–34.0)
MCHC: 30.8 g/dL (ref 30.0–36.0)
MCV: 94.9 fL (ref 80.0–100.0)
Platelets: 176 10*3/uL (ref 150–400)
RBC: 2.94 MIL/uL — ABNORMAL LOW (ref 3.87–5.11)
RDW: 17.5 % — ABNORMAL HIGH (ref 11.5–15.5)
WBC: 4.5 10*3/uL (ref 4.0–10.5)
nRBC: 0 % (ref 0.0–0.2)

## 2019-10-23 LAB — TYPE AND SCREEN
ABO/RH(D): A POS
Antibody Screen: NEGATIVE
Unit division: 0
Unit division: 0

## 2019-10-23 LAB — GLUCOSE, CAPILLARY
Glucose-Capillary: 146 mg/dL — ABNORMAL HIGH (ref 70–99)
Glucose-Capillary: 80 mg/dL (ref 70–99)
Glucose-Capillary: 95 mg/dL (ref 70–99)

## 2019-10-23 LAB — BPAM RBC
Blood Product Expiration Date: 202106012359
Blood Product Expiration Date: 202106012359
ISSUE DATE / TIME: 202105081816
ISSUE DATE / TIME: 202105082224
Unit Type and Rh: 6200
Unit Type and Rh: 6200

## 2019-10-23 LAB — TROPONIN I (HIGH SENSITIVITY): Troponin I (High Sensitivity): 566 ng/L (ref <18)

## 2019-10-23 NOTE — Plan of Care (Signed)

## 2019-10-23 NOTE — Discharge Summary (Signed)
Physician Discharge Summary  Brandy Houston FBP:[id number] DOB: 1955-10-04 DOA: 10/22/2019  PCP: [name], MD  Admit date: 10/22/2019 Discharge date: 10/23/2019  Admitted From: Home Discharge disposition: Home   Recommendations for Outpatient Follow-Up:   1. Outpatient follow-up with hematology and monitoring of CBC   Discharge Diagnosis:   Active Problems:   Anemia, blood loss   Anemia    Discharge Condition: Improved.  Diet recommendation: Renal.  Wound care: None.  Code status: Full.   History of Present Illness:   Brandy Houston is a 64 y.o. 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 She states that she was at dialysis earlier today and shortly after accessing her graft, needle slipped out and bleeding continued for "quite some time". According to dialysis center's report, about 1 unit blood was lost. They wrapped the graft site and sent her to the emergency department. Pt did have some short of breath and chest tightness before coming to the ED. But at the time I saw her, pt has no symptoms.   Hospital Course by Problem:   Acute blood loss anemia s/p 2 units PRBC tonight -Symptoms resolved and patient ready to go home as she has Mother's Day's plans  Chest pain with positive troponin Likely demanding ischemia due to low hemoglobin -Resolved Trop pattern is flat and EKG no acute changes, likely from acute anemia. Noted that pt told me she had a normal stress test in Oct 2020 quoting: Essentially normal stress test. There was a small fixed perfusion defect at the apex, but given normal wall motion on echo, suspect that this an artifact.  HTN -Resume home meds  ESRD -No significant uremia, acidosis, fluid overload or electrolytes problem, resume regular HD schedule as outpt.  IDDM Resume home  meds     Medical Consultants:      Discharge Exam:   Vitals:   10/23/19 0010 10/23/19 0448  BP: 134/78 138/76  Pulse: 76 75  Resp: 16 16  Temp: 98.2 F (36.8 C) 97.7 F (36.5 C)  SpO2: 96% 100%   Vitals:   10/22/19 2157 10/22/19 2313 10/23/19 0010 10/23/19 0448  BP: 128/61 111/85 134/78 138/76  Pulse: 83 80 76 75  Resp: '18 14 16 16  ' Temp: 98 F (36.7 C) 98.4 F (36.9 C) 98.2 F (36.8 C) 97.7 F (36.5 C)  TempSrc: Oral Oral Oral Oral  SpO2: 98% 97% 96% 100%  Weight:        General exam: Appears calm and comfortable.    The results of significant diagnostics from this hospitalization (including imaging, microbiology, ancillary and laboratory) are listed below for reference.     Procedures and Diagnostic Studies:   DG Chest Port 1 View  Result Date: 10/22/2019 CLINICAL DATA:  Chest tightness. EXAM: PORTABLE CHEST 1 VIEW COMPARISON:  06/05/2019 FINDINGS: Stable cardiomegaly. Atherosclerotic calcification of the aortic arch. No pulmonary edema. The lungs appear clear. No blunting of the costophrenic angles. IMPRESSION: 1. Stable cardiomegaly. 2. No acute findings. 3. Atherosclerotic calcification of the aortic arch. Electronically Signed   By: Van Clines M.D.   On: 10/22/2019 16:09     Labs:   Basic Metabolic Panel: Recent Labs  Lab 10/22/19 1607  NA 138  K 3.0*  CL 98  CO2 29  GLUCOSE 122*  BUN 18  CREATININE 3.07*  CALCIUM 7.7*   GFR Estimated Creatinine Clearance: 17.5 mL/min (A) (by C-G formula based on SCr of 3.07 mg/dL (H)). Liver Function Tests: Recent Labs  Lab 10/22/19 1607  AST 14*  ALT 20  ALKPHOS 46  BILITOT 0.7  PROT 6.1*  ALBUMIN 3.0*   No results for input(s): LIPASE, AMYLASE in the last 168 hours. No results for input(s): AMMONIA in the last 168 hours. Coagulation profile No results for input(s): INR, PROTIME in the last 168 hours.  CBC: Recent Labs  Lab 10/22/19 1607 10/23/19 0431  WBC 4.0 4.5  NEUTROABS 2.8   --   HGB 6.0* 8.6*  HCT 20.5* 27.9*  MCV 98.1 94.9  PLT 191 176   Cardiac Enzymes: No results for input(s): CKTOTAL, CKMB, CKMBINDEX, TROPONINI in the last 168 hours. BNP: Invalid input(s): POCBNP CBG: Recent Labs  Lab 10/22/19 2009 10/22/19 2107  GLUCAP 85 100*   D-Dimer No results for input(s): DDIMER in the last 72 hours. Hgb A1c Recent Labs    10/22/19 1607  HGBA1C 4.4*   Lipid Profile No results for input(s): CHOL, HDL, LDLCALC, TRIG, CHOLHDL, LDLDIRECT in the last 72 hours. Thyroid function studies No results for input(s): TSH, T4TOTAL, T3FREE, THYROIDAB in the last 72 hours.  Invalid input(s): FREET3 Anemia work up No results for input(s): VITAMINB12, FOLATE, FERRITIN, TIBC, IRON, RETICCTPCT in the last 72 hours. Microbiology No results found for this or any previous visit (from the past 240 hour(s)).   Discharge Instructions:   Discharge Instructions    Discharge instructions   Complete by: As directed    Renal/carb mod diet   Increase activity slowly   Complete by: As directed      Allergies as of 10/23/2019      Reactions   Compazine [prochlorperazine] Shortness Of Breath, Swelling, Other (See Comments)   TONGUE SWELLS   Shellfish-derived Products Anaphylaxis   Iodinated Diagnostic Agents Hives, Rash   Omnipaque [iohexol] Hives   Tape    Brown paper tape caused skin irritation   Sulfa Antibiotics Rash      Medication List    STOP taking these medications   predniSONE 10 MG tablet Commonly known as: DELTASONE     TAKE these medications   Accu-Chek Guide test strip Generic drug: glucose blood Check blood sugar up to 4 times a day   Accu-Chek Guide w/Device Kit 1 Device by Does not apply route QID. Check blood sugar up to 4 times a day   accu-chek softclix lancets Check sugars TID for E11.65   Accu-Chek Softclix Lancets lancets Check sugars three times a day for E11.65   ALLERGY PO Take 1 tablet by mouth daily as needed  (allergies).   amLODipine 10 MG tablet Commonly known as: NORVASC Take 1 tablet (10 mg total) by mouth at bedtime.   aspirin EC 81 MG tablet Take 81 mg by mouth daily.   atorvastatin 10 MG tablet Commonly known as: LIPITOR Take 1 tablet (10 mg total) by mouth at bedtime. Patient needs to call our office to schedule a appointment for further refills([phone])   blood glucose meter kit and supplies Kit Dispense based on patient and insurance preference. Use up to four times daily as directed   calcium acetate 667 MG capsule Commonly known as: PHOSLO Take 2 capsules by mouth 3 (three) times daily.   cloNIDine 0.2 MG tablet Commonly known as: CATAPRES Take 0.2 mg by mouth 2 (two) times daily.   gabapentin 100 MG  capsule Commonly known as: NEURONTIN Take 200 mg by mouth 3 (three) times daily.   hydrALAZINE 100 MG tablet Commonly known as: APRESOLINE Take 100 mg by mouth as directed. Take only on non dialysis day   IRON PO Take 325 mg by mouth daily.   Lantus SoloStar 100 UNIT/ML Solostar Pen Generic drug: insulin glargine Inject 10 Units into the skin daily.   lidocaine-prilocaine cream Commonly known as: EMLA Apply 1 application topically once.   methocarbamol 500 MG tablet Commonly known as: ROBAXIN Take 500 mg by mouth every 8 (eight) hours as needed for muscle spasms.   MIRCERA IJ Mircera   multivitamin Tabs tablet Take 1 tablet by mouth daily.   omeprazole 20 MG capsule Commonly known as: PRILOSEC Take 1 capsule (20 mg total) by mouth daily before breakfast. Patient needs to call our office to schedule a appointment for further [phone])   polyethylene glycol 17 g packet Commonly known as: MIRALAX / GLYCOLAX Take 17 g by mouth daily as needed for moderate constipation.   sertraline 100 MG tablet Commonly known as: Zoloft Take 1 tablet (100 mg total) by mouth daily.   sevelamer carbonate 800 MG tablet Commonly known as: RENVELA Take  1,600 mg by mouth 3 (three) times daily with meals.   traZODone 50 MG tablet Commonly known as: DESYREL Take 50 mg by mouth at bedtime.         Time coordinating discharge: 25 min  Signed:  Geradine Girt DO  [facility] 10/23/2019, 7:57 AM

## 2019-10-23 NOTE — Plan of Care (Signed)
  Problem: Education: Goal: Knowledge of General Education information will improve Description: Including pain rating scale, medication(s)/side effects and non-pharmacologic comfort measures 10/23/2019 1245 by Reed Breech, RN Outcome: Adequate for Discharge 10/23/2019 1245 by Reed Breech, RN Outcome: Adequate for Discharge   Problem: Health Behavior/Discharge Planning: Goal: Ability to manage health-related needs will improve 10/23/2019 1245 by Reed Breech, RN Outcome: Adequate for Discharge 10/23/2019 1245 by Reed Breech, RN Outcome: Adequate for Discharge   Problem: Clinical Measurements: Goal: Ability to maintain clinical measurements within normal limits will improve 10/23/2019 1245 by Reed Breech, RN Outcome: Adequate for Discharge 10/23/2019 1245 by Reed Breech, RN Outcome: Adequate for Discharge Goal: Will remain free from infection 10/23/2019 1245 by Reed Breech, RN Outcome: Adequate for Discharge 10/23/2019 1245 by Reed Breech, RN Outcome: Adequate for Discharge Goal: Diagnostic test results will improve 10/23/2019 1245 by Reed Breech, RN Outcome: Adequate for Discharge 10/23/2019 1245 by Reed Breech, RN Outcome: Adequate for Discharge Goal: Respiratory complications will improve 10/23/2019 1245 by Reed Breech, RN Outcome: Adequate for Discharge 10/23/2019 1245 by Reed Breech, RN Outcome: Adequate for Discharge Goal: Cardiovascular complication will be avoided 10/23/2019 1245 by Reed Breech, RN Outcome: Adequate for Discharge 10/23/2019 1245 by Reed Breech, RN Outcome: Adequate for Discharge   Problem: Activity: Goal: Risk for activity intolerance will decrease 10/23/2019 1245 by Reed Breech, RN Outcome: Adequate for Discharge 10/23/2019 1245 by Reed Breech, RN Outcome: Adequate for Discharge   Problem:  Nutrition: Goal: Adequate nutrition will be maintained 10/23/2019 1245 by Reed Breech, RN Outcome: Adequate for Discharge 10/23/2019 1245 by Reed Breech, RN Outcome: Adequate for Discharge   Problem: Coping: Goal: Level of anxiety will decrease 10/23/2019 1245 by Reed Breech, RN Outcome: Adequate for Discharge 10/23/2019 1245 by Reed Breech, RN Outcome: Adequate for Discharge   Problem: Elimination: Goal: Will not experience complications related to bowel motility 10/23/2019 1245 by Reed Breech, RN Outcome: Adequate for Discharge 10/23/2019 1245 by Reed Breech, RN Outcome: Adequate for Discharge Goal: Will not experience complications related to urinary retention 10/23/2019 1245 by Reed Breech, RN Outcome: Adequate for Discharge 10/23/2019 1245 by Reed Breech, RN Outcome: Adequate for Discharge   Problem: Pain Managment: Goal: General experience of comfort will improve 10/23/2019 1245 by Reed Breech, RN Outcome: Adequate for Discharge 10/23/2019 1245 by Reed Breech, RN Outcome: Adequate for Discharge   Problem: Safety: Goal: Ability to remain free from injury will improve 10/23/2019 1245 by Reed Breech, RN Outcome: Adequate for Discharge 10/23/2019 1245 by Reed Breech, RN Outcome: Adequate for Discharge   Problem: Skin Integrity: Goal: Risk for impaired skin integrity will decrease 10/23/2019 1245 by Reed Breech, RN Outcome: Adequate for Discharge 10/23/2019 1245 by Reed Breech, RN Outcome: Adequate for Discharge

## 2019-10-23 NOTE — Progress Notes (Signed)
Discharge instructions provided.  Patient verbalizes understanding of all instructions and follow-up care.  Pt discharged to home via wheelchair by NT at 1230 on 10/23/19.

## 2019-10-25 DIAGNOSIS — Z992 Dependence on renal dialysis: Secondary | ICD-10-CM | POA: Diagnosis not present

## 2019-10-25 DIAGNOSIS — D631 Anemia in chronic kidney disease: Secondary | ICD-10-CM | POA: Diagnosis not present

## 2019-10-25 DIAGNOSIS — E1129 Type 2 diabetes mellitus with other diabetic kidney complication: Secondary | ICD-10-CM | POA: Diagnosis not present

## 2019-10-25 DIAGNOSIS — D509 Iron deficiency anemia, unspecified: Secondary | ICD-10-CM | POA: Diagnosis not present

## 2019-10-25 DIAGNOSIS — N186 End stage renal disease: Secondary | ICD-10-CM | POA: Diagnosis not present

## 2019-10-25 DIAGNOSIS — N2581 Secondary hyperparathyroidism of renal origin: Secondary | ICD-10-CM | POA: Diagnosis not present

## 2019-10-27 DIAGNOSIS — D631 Anemia in chronic kidney disease: Secondary | ICD-10-CM | POA: Diagnosis not present

## 2019-10-27 DIAGNOSIS — Z992 Dependence on renal dialysis: Secondary | ICD-10-CM | POA: Diagnosis not present

## 2019-10-27 DIAGNOSIS — N2581 Secondary hyperparathyroidism of renal origin: Secondary | ICD-10-CM | POA: Diagnosis not present

## 2019-10-27 DIAGNOSIS — N186 End stage renal disease: Secondary | ICD-10-CM | POA: Diagnosis not present

## 2019-10-27 DIAGNOSIS — E1129 Type 2 diabetes mellitus with other diabetic kidney complication: Secondary | ICD-10-CM | POA: Diagnosis not present

## 2019-10-27 DIAGNOSIS — D509 Iron deficiency anemia, unspecified: Secondary | ICD-10-CM | POA: Diagnosis not present

## 2019-10-28 ENCOUNTER — Other Ambulatory Visit: Payer: Self-pay

## 2019-10-28 ENCOUNTER — Telehealth: Payer: Self-pay | Admitting: Family Medicine

## 2019-10-28 NOTE — Telephone Encounter (Signed)
Medication has been sent e-scribe and paper prescription multiple times with confirmation.

## 2019-10-28 NOTE — Telephone Encounter (Signed)
  LAST APPOINTMENT DATE: 10/19/2019   NEXT APPOINTMENT DATE:@5 /17/2021  MEDICATION: TEST STRIPS    [place], [place].  COMMENT: Patient states that she has not received the test strips and has contacted the pharmacy multiple times but they tell her they are waiting on a response from [place].    **Let patient know to contact pharmacy at the end of the day to make sure medication is ready. **  ** Please notify patient to allow 48-72 hours to process**  **Encourage patient to contact the pharmacy for refills or they can request refills through [facility]**  CLINICAL FILLS OUT ALL BELOW:   LAST REFILL:  QTY:  REFILL DATE:    OTHER COMMENTS:    Okay for refill?  Please advise

## 2019-10-29 DIAGNOSIS — D631 Anemia in chronic kidney disease: Secondary | ICD-10-CM | POA: Diagnosis not present

## 2019-10-29 DIAGNOSIS — Z992 Dependence on renal dialysis: Secondary | ICD-10-CM | POA: Diagnosis not present

## 2019-10-29 DIAGNOSIS — N2581 Secondary hyperparathyroidism of renal origin: Secondary | ICD-10-CM | POA: Diagnosis not present

## 2019-10-29 DIAGNOSIS — D509 Iron deficiency anemia, unspecified: Secondary | ICD-10-CM | POA: Diagnosis not present

## 2019-10-29 DIAGNOSIS — N186 End stage renal disease: Secondary | ICD-10-CM | POA: Diagnosis not present

## 2019-10-29 DIAGNOSIS — E1129 Type 2 diabetes mellitus with other diabetic kidney complication: Secondary | ICD-10-CM | POA: Diagnosis not present

## 2019-10-30 ENCOUNTER — Other Ambulatory Visit: Payer: Self-pay | Admitting: Family Medicine

## 2019-10-31 ENCOUNTER — Other Ambulatory Visit: Payer: Self-pay

## 2019-10-31 ENCOUNTER — Encounter: Payer: Self-pay | Admitting: Family Medicine

## 2019-10-31 ENCOUNTER — Ambulatory Visit (INDEPENDENT_AMBULATORY_CARE_PROVIDER_SITE_OTHER): Payer: Medicare Other | Admitting: Family Medicine

## 2019-10-31 VITALS — BP 130/72 | HR 87 | Temp 98.1°F | Resp 18 | Ht 67.0 in | Wt 147.2 lb

## 2019-10-31 DIAGNOSIS — Z992 Dependence on renal dialysis: Secondary | ICD-10-CM

## 2019-10-31 DIAGNOSIS — F339 Major depressive disorder, recurrent, unspecified: Secondary | ICD-10-CM

## 2019-10-31 DIAGNOSIS — Z23 Encounter for immunization: Secondary | ICD-10-CM

## 2019-10-31 DIAGNOSIS — D638 Anemia in other chronic diseases classified elsewhere: Secondary | ICD-10-CM | POA: Diagnosis not present

## 2019-10-31 DIAGNOSIS — D500 Iron deficiency anemia secondary to blood loss (chronic): Secondary | ICD-10-CM

## 2019-10-31 DIAGNOSIS — Z1231 Encounter for screening mammogram for malignant neoplasm of breast: Secondary | ICD-10-CM

## 2019-10-31 DIAGNOSIS — N186 End stage renal disease: Secondary | ICD-10-CM | POA: Diagnosis not present

## 2019-10-31 DIAGNOSIS — E2839 Other primary ovarian failure: Secondary | ICD-10-CM | POA: Diagnosis not present

## 2019-10-31 DIAGNOSIS — I10 Essential (primary) hypertension: Secondary | ICD-10-CM | POA: Diagnosis not present

## 2019-10-31 DIAGNOSIS — E1122 Type 2 diabetes mellitus with diabetic chronic kidney disease: Secondary | ICD-10-CM

## 2019-10-31 LAB — POCT GLYCOSYLATED HEMOGLOBIN (HGB A1C): Hemoglobin A1C: 4.8 % (ref 4.0–5.6)

## 2019-10-31 MED ORDER — ACCU-CHEK GUIDE W/DEVICE KIT: 1.0000 | PACK | Freq: Every day | 3 refills | Status: AC

## 2019-10-31 NOTE — Progress Notes (Signed)
Subjective  CC:  Chief Complaint  Patient presents with  . Diabetes  . Referral    Due to her losing alot of blood. per her kidney doctor she needs a referral to GI.     HPI: Brandy Houston is a 64 y.o. female who presents to the office today for follow up of diabetes and problems listed above in the chief complaint.   Diabetes follow up: last  Her diabetic control is reported as Unchanged. Last visit for same 01/2019. Checks periodically but ran out of strips. Sugars are never higher than 120. Denies lows.  She denies exertional CP or SOB or symptomatic hypoglycemia. She denies foot sores or paresthesias.   ESRD on dialyisis w/ recent acute blood loss due to needle coming out of her fistual during dialysis a week or so ago. Was hospitalized overnight and transfused 2 units PRBCs for a hgb of 6 with cp. Feels better now. Has chronic anemia but had worsened and was to have GI eval last year. Had colonoscopy in January but prep was inadequate. She did not f/u. Needs GI eval for worsening anemia in addition to CKD anemia. Pt denies gerd, melena.   HTN: has been controlled. Mostly volume dependent  Depression: reports doing fine on meds. No recent active sxs.  HM: overdue for mammo and dexa and prevnar. Given comorbidities, no further pap smears are recommended at this time and pt declines further screens. To get colonoscopy as noted above.   Wt Readings from Last 3 Encounters:  10/31/19 147 lb 3.2 oz (66.8 kg)  10/22/19 133 lb 13.1 oz (60.7 kg)  07/27/19 143 lb 6.4 oz (65 kg)    BP Readings from Last 3 Encounters:  10/31/19 130/72  10/23/19 (!) 119/53  07/27/19 117/69    Assessment  1. DM type 2 causing ESRD ([place])   2. ESRD (end stage renal disease) on dialysis ([place])   3. Anemia of chronic disease   4. Anemia, blood loss   5. Benign essential HTN   6. Major depression, recurrent, chronic ([place])   7. Encounter for screening mammogram for breast cancer   8. Hypoestrogenism      Plan   Diabetes is currently too tightly controlled. Given ESRD and low A1c, will stop lantus 10. Will recheck in 3 months.   Anemia: multifactorial; to GI again to r/o chronic GI blood loss. On iron per renal.   HTN is controlled.   Depression is controlled.   Screens: ordered mammo and dexa. prevnar updated today.   Follow up: 3 months for dm and bp f/u. Orders Placed This Encounter  Procedures  . MM DIGITAL SCREENING BILATERAL  . DG Bone Density  . Pneumococcal conjugate vaccine 13-valent IM  . Ambulatory referral to Gastroenterology  . POCT HgB A1C   Meds ordered this encounter  Medications  . Blood Glucose Monitoring Suppl (ACCU-CHEK GUIDE) w/Device KIT    Sig: 1 Device by Does not apply route daily.    Dispense:  1 kit    Refill:  3      Immunization History  Administered Date(s) Administered  . Fluad Quad(high Dose 65+) 02/28/2019  . Hepatitis B, adult 10/18/2014, 11/07/2014, 12/12/2014, 05/27/2018, 06/24/2018, 07/29/2018, 12/23/2018, 01/27/2019, 02/24/2019, 03/31/2019, 07/28/2019  . Hepatitis B, ped/adol 11/07/2014, 12/12/2014  . Influenza Inj Mdck Quad Pf 08/08/2014  . Influenza, Seasonal, Injecte, Preservative Fre 02/08/2015, 02/13/2016  . Influenza,inj,Quad PF,6+ Mos 08/08/2014, 04/01/2017  . Influenza-Unspecified 08/08/2014, 01/14/2018  . Moderna SARS-COVID-2 Vaccination 08/11/2019, 09/08/2019  .  PPD Test 11/13/2015  . Pneumococcal Conjugate-13 10/31/2019  . Pneumococcal Polysaccharide-23 08/08/2014  . Pneumococcal-Unspecified 08/08/2014  . Tdap 08/25/2016    Diabetes Related Lab Review: Lab Results  Component Value Date   HGBA1C 4.8 10/31/2019   HGBA1C 4.4 (L) 10/22/2019   HGBA1C 6.3 (A) 02/28/2019    Lab Results  Component Value Date   MICROALBUR 266.7 (H) 11/14/2014   Lab Results  Component Value Date   CREATININE 3.07 (H) 10/22/2019   BUN 18 10/22/2019   NA 138 10/22/2019   K 3.0 (L) 10/22/2019   CL 98 10/22/2019   CO2 29 10/22/2019     Lab Results  Component Value Date   CHOL 111 12/10/2018   CHOL 121 11/25/2017   CHOL 97 11/08/2015   Lab Results  Component Value Date   HDL 61.40 12/10/2018   HDL 47.70 11/25/2017   HDL 29 (L) 11/08/2015   Lab Results  Component Value Date   LDLCALC 33 12/10/2018   LDLCALC 57 11/25/2017   LDLCALC 51 11/08/2015   Lab Results  Component Value Date   TRIG 82.0 12/10/2018   TRIG 79.0 11/25/2017   TRIG 83 11/08/2015   Lab Results  Component Value Date   CHOLHDL 2 12/10/2018   CHOLHDL 3 11/25/2017   CHOLHDL 3.3 11/08/2015   No results found for: LDLDIRECT The ASCVD Risk score Mikey Bussing DC Jr., et al., 2013) failed to calculate for the following reasons:   The valid total cholesterol range is 130 to 320 mg/dL I have reviewed the PMH, Fam and Soc history. Patient Active Problem List   Diagnosis Date Noted  . End stage renal disease ([place]) 04/19/2018    Priority: High  . Secondary hyperparathyroidism of renal origin ([place]) 04/19/2018    Priority: High  . Tobacco use 04/19/2018    Priority: High  . Benign essential HTN     Priority: High  . Above knee amputation status, left     Priority: High  . DM type 2 causing ESRD ([place]) 03/21/2017    Priority: High  . Anemia in chronic kidney disease 02/05/2017    Priority: High  . Chronic diastolic CHF (congestive heart failure) ([place]) 11/08/2015    Priority: High  . Diabetic peripheral neuropathy ([name])     Priority: High  . Chronic kidney disease (CKD), stage IV (severe) ([name])     Priority: High  . Anemia, blood loss     Priority: Medium  . Major depression, recurrent, chronic ([name])     Priority: Medium  . Hepatitis C 08/16/2014    Priority: Medium  . Hypercalcemia 10/06/2019  . QT prolongation 06/05/2019  . Moderate protein-calorie malnutrition ([place]) 05/11/2018  . Iron deficiency anemia, unspecified 04/19/2018  . Nephrotic syndrome with unspecified morphologic changes 04/19/2018  . Peripheral neuropathy 06/24/2013     Overview:  Noted to be idiopathic in PCP notes     Social History: Patient  reports that she has been smoking cigarettes. She has a 17.50 pack-year smoking history. She has never used smokeless tobacco. She reports previous alcohol use. She reports that she does not use drugs.  Review of Systems: Ophthalmic: negative for eye pain, loss of vision or double vision Cardiovascular: negative for chest pain Respiratory: negative for SOB or persistent cough Gastrointestinal: negative for abdominal pain Genitourinary: negative for dysuria or gross hematuria    Objective  Vitals: BP 130/72   Pulse 87   Temp 98.1 F (36.7 C) (Temporal)   Resp 18   Ht  '5\' 7"'  (1.702 m)   Wt 147 lb 3.2 oz (66.8 kg)   LMP 03/16/2002   SpO2 94%   BMI 23.05 kg/m  General: well appearing, no acute distress  Psych:  Alert and oriented, normal mood and affect Cor: rrr w/o murmur Lung CTAB  Diabetic education: ongoing education regarding chronic disease management for diabetes was given today. We continue to reinforce the ABC's of diabetic management: A1c (<7 or 8 dependent upon patient), tight blood pressure control, and cholesterol management with goal LDL < 100 minimally. We discuss diet strategies, exercise recommendations, medication options and possible side effects. At each visit, we review recommended immunizations and preventive care recommendations for diabetics and stress that good diabetic control can prevent other problems. See below for this patient's data.    Commons side effects, risks, benefits, and alternatives for medications and treatment plan prescribed today were discussed, and the patient expressed understanding of the given instructions. Patient is instructed to call or message via MyChart if he/she has any questions or concerns regarding our treatment plan. No barriers to understanding were identified. We discussed Red Flag symptoms and signs in detail. Patient expressed understanding  regarding what to do in case of urgent or emergency type symptoms.   Medication list was reconciled, printed and provided to the patient in AVS. Patient instructions and summary information was reviewed with the patient as documented in the AVS. This note was prepared with assistance of Dragon voice recognition software. Occasional wrong-word or sound-a-like substitutions may have occurred due to the inherent limitations of voice recognition software  This visit occurred during the SARS-CoV-2 public health emergency.  Safety protocols were in place, including screening questions prior to the visit, additional usage of staff PPE, and extensive cleaning of exam room while observing appropriate contact time as indicated for disinfecting solutions.

## 2019-10-31 NOTE — Patient Instructions (Signed)
Please return in 3 months for diabetes recheck off insulin and your annual complete physical; please come fasting.  Today you were given your Prevnar 13 vaccination. This helps protect you from pneumonia.  I have ordered a mammogram and/or bone density for you as we discussed today: [x]   Mammogram  [x]   Bone Density  Please call the office checked below to schedule your appointment: Your appointment will at the following location  []   [facility]      [place], [place]         [x]   [facility]  [place], [place]  Make sure to wear two peace clothing  No lotions powders or deodorants the day of the appointment Make sure to bring picture ID and insurance card.  Bring list of medications you are currently taking including any supplements.   I have referred you back to GI for upper and lower endoscopy to ensure the blood loss is not coming from your GI tract. We will call you with an appointment.   If you have any questions or concerns, please don't hesitate to send me a message via MyChart or call the office at [phone]. Thank you for visiting with [place] today! It's our pleasure caring for you.

## 2019-11-01 DIAGNOSIS — N2581 Secondary hyperparathyroidism of renal origin: Secondary | ICD-10-CM | POA: Diagnosis not present

## 2019-11-01 DIAGNOSIS — Z992 Dependence on renal dialysis: Secondary | ICD-10-CM | POA: Diagnosis not present

## 2019-11-01 DIAGNOSIS — D509 Iron deficiency anemia, unspecified: Secondary | ICD-10-CM | POA: Diagnosis not present

## 2019-11-01 DIAGNOSIS — E1129 Type 2 diabetes mellitus with other diabetic kidney complication: Secondary | ICD-10-CM | POA: Diagnosis not present

## 2019-11-01 DIAGNOSIS — N186 End stage renal disease: Secondary | ICD-10-CM | POA: Diagnosis not present

## 2019-11-01 DIAGNOSIS — D631 Anemia in chronic kidney disease: Secondary | ICD-10-CM | POA: Diagnosis not present

## 2019-11-03 DIAGNOSIS — Z992 Dependence on renal dialysis: Secondary | ICD-10-CM | POA: Diagnosis not present

## 2019-11-03 DIAGNOSIS — E1129 Type 2 diabetes mellitus with other diabetic kidney complication: Secondary | ICD-10-CM | POA: Diagnosis not present

## 2019-11-03 DIAGNOSIS — D509 Iron deficiency anemia, unspecified: Secondary | ICD-10-CM | POA: Diagnosis not present

## 2019-11-03 DIAGNOSIS — N186 End stage renal disease: Secondary | ICD-10-CM | POA: Diagnosis not present

## 2019-11-03 DIAGNOSIS — D631 Anemia in chronic kidney disease: Secondary | ICD-10-CM | POA: Diagnosis not present

## 2019-11-03 DIAGNOSIS — N2581 Secondary hyperparathyroidism of renal origin: Secondary | ICD-10-CM | POA: Diagnosis not present

## 2019-11-04 ENCOUNTER — Other Ambulatory Visit: Payer: Self-pay | Admitting: Family Medicine

## 2019-11-05 DIAGNOSIS — D631 Anemia in chronic kidney disease: Secondary | ICD-10-CM | POA: Diagnosis not present

## 2019-11-05 DIAGNOSIS — Z992 Dependence on renal dialysis: Secondary | ICD-10-CM | POA: Diagnosis not present

## 2019-11-05 DIAGNOSIS — N2581 Secondary hyperparathyroidism of renal origin: Secondary | ICD-10-CM | POA: Diagnosis not present

## 2019-11-05 DIAGNOSIS — N186 End stage renal disease: Secondary | ICD-10-CM | POA: Diagnosis not present

## 2019-11-05 DIAGNOSIS — D509 Iron deficiency anemia, unspecified: Secondary | ICD-10-CM | POA: Diagnosis not present

## 2019-11-05 DIAGNOSIS — E1129 Type 2 diabetes mellitus with other diabetic kidney complication: Secondary | ICD-10-CM | POA: Diagnosis not present

## 2019-11-08 DIAGNOSIS — E1129 Type 2 diabetes mellitus with other diabetic kidney complication: Secondary | ICD-10-CM | POA: Diagnosis not present

## 2019-11-08 DIAGNOSIS — D631 Anemia in chronic kidney disease: Secondary | ICD-10-CM | POA: Diagnosis not present

## 2019-11-08 DIAGNOSIS — D509 Iron deficiency anemia, unspecified: Secondary | ICD-10-CM | POA: Diagnosis not present

## 2019-11-08 DIAGNOSIS — N186 End stage renal disease: Secondary | ICD-10-CM | POA: Diagnosis not present

## 2019-11-08 DIAGNOSIS — N2581 Secondary hyperparathyroidism of renal origin: Secondary | ICD-10-CM | POA: Diagnosis not present

## 2019-11-08 DIAGNOSIS — Z992 Dependence on renal dialysis: Secondary | ICD-10-CM | POA: Diagnosis not present

## 2019-11-10 DIAGNOSIS — D509 Iron deficiency anemia, unspecified: Secondary | ICD-10-CM | POA: Diagnosis not present

## 2019-11-10 DIAGNOSIS — E1129 Type 2 diabetes mellitus with other diabetic kidney complication: Secondary | ICD-10-CM | POA: Diagnosis not present

## 2019-11-10 DIAGNOSIS — N186 End stage renal disease: Secondary | ICD-10-CM | POA: Diagnosis not present

## 2019-11-10 DIAGNOSIS — Z992 Dependence on renal dialysis: Secondary | ICD-10-CM | POA: Diagnosis not present

## 2019-11-10 DIAGNOSIS — N2581 Secondary hyperparathyroidism of renal origin: Secondary | ICD-10-CM | POA: Diagnosis not present

## 2019-11-10 DIAGNOSIS — D631 Anemia in chronic kidney disease: Secondary | ICD-10-CM | POA: Diagnosis not present

## 2019-11-12 DIAGNOSIS — N2581 Secondary hyperparathyroidism of renal origin: Secondary | ICD-10-CM | POA: Diagnosis not present

## 2019-11-12 DIAGNOSIS — D509 Iron deficiency anemia, unspecified: Secondary | ICD-10-CM | POA: Diagnosis not present

## 2019-11-12 DIAGNOSIS — N186 End stage renal disease: Secondary | ICD-10-CM | POA: Diagnosis not present

## 2019-11-12 DIAGNOSIS — E1129 Type 2 diabetes mellitus with other diabetic kidney complication: Secondary | ICD-10-CM | POA: Diagnosis not present

## 2019-11-12 DIAGNOSIS — D631 Anemia in chronic kidney disease: Secondary | ICD-10-CM | POA: Diagnosis not present

## 2019-11-12 DIAGNOSIS — Z992 Dependence on renal dialysis: Secondary | ICD-10-CM | POA: Diagnosis not present

## 2019-11-23 ENCOUNTER — Ambulatory Visit (INDEPENDENT_AMBULATORY_CARE_PROVIDER_SITE_OTHER): Payer: Medicare Other | Admitting: Nurse Practitioner

## 2019-11-23 ENCOUNTER — Encounter: Payer: Self-pay | Admitting: Nurse Practitioner

## 2019-11-23 VITALS — BP 124/60 | HR 78 | Ht 67.0 in | Wt 137.0 lb

## 2019-11-23 DIAGNOSIS — D649 Anemia, unspecified: Secondary | ICD-10-CM

## 2019-11-23 NOTE — Patient Instructions (Signed)
If you are age 65 or older, your body mass index should be between 23-30. Your Body mass index is 21.46 kg/m. If this is out of the aforementioned range listed, please consider follow up with your Primary Care Provider.  If you are age 3 or younger, your body mass index should be between 19-25. Your Body mass index is 21.46 kg/m. If this is out of the aformentioned range listed, please consider follow up with your Primary Care Provider.   Hold Iron supplement 7 days prior to colonoscopy.   You have been scheduled for an endoscopy and colonoscopy. Please follow the written instructions given to you at your visit today. Please pick up your prep supplies at the pharmacy within the next 1-3 days. If you use inhalers (even only as needed), please bring them with you on the day of your procedure.

## 2019-11-23 NOTE — Progress Notes (Signed)
IMPRESSION and PLAN:    64 year old female pmh significant for hypertension, hyperlipidemia, CHF, ESRD on HD, diabetes, depression , chronic anemia, HCV, chronic constipation  # Chronic anemia, macrocytic to normocytic over last few month.  --No history of iron deficiency but takes iron supplements.  -- Baseline hgb was in 10-11 range until a year ago. She has since had two declines in hgb requiring transfusions though one of the declines was associated with bleeding from AV graft.  --Patient hasn't had a complete colonoscopy. One in 2015 and two earlier this year were incomplete due to poor bowel prep. She needs a colonoscopy.  --Patient will be scheduled for a colonoscopy. The risks and benefits of colonoscopy with possible polypectomy / biopsies were discussed and the patient agrees to proceed.  --Two day bowel prep.  Also hold iron for 7 days prior to colonoscopy --Stools probably intermittently black due to oral iron but will also schedule for EGD for evaluation of anemia. The risks and benefits of EGD were discussed and the patient agrees to proceed.  # Chronic constipation --Manages with MiraLAX nightly  # Hx of HCV, treated.  --HCV viral load not detected in 2018    HPI:    Primary GI: Harl Bowie, MD   Chief complaint :  None. Here for anemia  Brandy Houston is a 64 year old female with multiple medical problems as listed above.  She has chronic anemia. At baseline her hemoglobin was staying around 9-11 grams in 2019 but on 11/27/18 she was seen in ED and hgb had declined to 7.1 for unclear reasons.  She was Hemoccult positive but stool was light brown.  Later that month she received a unit of blood.  Hgb stayed in mid 8 range until 10/22/2019 when it declined to 6 but she was admitted at the time with AV graft bleeding. She was transfused, the following day hemoglobin had improved to 8.6.   Brandy Houston is referred for evaluation of anemia.  Her stools are  intermittently black, on chronic iron. Takes Miralax at night as iron causes stool to be hard. She has no focal GI symptoms.  She takes a daily 81 mg aspirin, no other NSAIDs.  She is on a daily PPI.   Review of systems:     No chest pain, no SOB, no fevers, no urinary sx   Past Medical History:  Diagnosis Date  . Allergy   . Anemia   . Anxiety   . Arthritis    "in my joints" (03/10/2017)  . Chronic diastolic CHF (congestive heart failure) ([place])   . CKD (chronic kidney disease), stage IV ([place])    stage IV. previous HD, none currently 10/30/15 (confirmed 02/05/2017 & 03/10/2017)  . Depression    Chronic  . Diabetic peripheral neuropathy ([place])   . Dysrhythmia    tachycardia, normal ECHO 08-09-14  . GERD (gastroesophageal reflux disease)   . Heart murmur     dx'd 02/05/2017  . Hepatitis C    "tx'd in 2016; I'm negative now" (02/05/2017)  . History of blood transfusion 2017   "w/knee replacement"  . Hyperlipidemia   . Hypertension   . Osteoarthritis of left knee   . Peripheral neuropathy   . Protein calorie malnutrition ([place])   . Septic arthritis ([place])   . Slow transit constipation   . Type II diabetes mellitus ([name])    IDDM  . Uncontrolled hypertension 02/18/2013  . Unsteady gait  Patient's surgical history, family medical history, social history, medications and allergies were all reviewed in Epic   Creatinine clearance cannot be calculated (Patient's most recent lab result is older than the maximum 21 days allowed.)  Current Outpatient Medications  Medication Sig Dispense Refill  . Accu-Chek Softclix Lancets lancets Check sugars three times a day for E11.65 100 each 12  . amLODipine (NORVASC) 10 MG tablet Take 1 tablet (10 mg total) by mouth at bedtime. 30 tablet 0  . aspirin EC 81 MG tablet Take 81 mg by mouth daily.    [name] atorvastatin (LIPITOR) 10 MG tablet TAKE 1 TABLET BY MOUTH AT BEDTIME . APPOINTMENT REQUIRED FOR FUTURE REFILLS 90 tablet 3  . blood glucose meter kit  and supplies KIT Dispense based on patient and insurance preference. Use up to four times daily as directed 1 each 0  . Blood Glucose Monitoring Suppl (ACCU-CHEK GUIDE) w/Device KIT 1 Device by Does not apply route daily. 1 kit 3  . calcium acetate (PHOSLO) 667 MG capsule Take 2 capsules by mouth 3 (three) times daily.    . Chlorpheniramine Maleate (ALLERGY PO) Take 1 tablet by mouth daily as needed (allergies).    . cloNIDine (CATAPRES) 0.2 MG tablet Take 0.2 mg by mouth 2 (two) times daily.    . Ferrous Sulfate (IRON PO) Take 325 mg by mouth daily.     [name] gabapentin (NEURONTIN) 100 MG capsule Take 200 mg by mouth 3 (three) times daily.    [name] glucose blood (ACCU-CHEK GUIDE) test strip Check blood sugar up to 4 times a day 100 each 12  . hydrALAZINE (APRESOLINE) 100 MG tablet Take 100 mg by mouth as directed. Take only on non dialysis day    . Lancet Devices (ACCU-CHEK SOFTCLIX) lancets Check sugars TID for E11.65 1 each 10  . lidocaine-prilocaine (EMLA) cream Apply 1 application topically once.     . methocarbamol (ROBAXIN) 500 MG tablet Take 500 mg by mouth every 8 (eight) hours as needed for muscle spasms.     . Methoxy PEG-Epoetin Beta (MIRCERA IJ) Mircera    . multivitamin (RENA-VIT) TABS tablet Take 1 tablet by mouth daily.    [name] omeprazole (PRILOSEC) 20 MG capsule TAKE 1 CAPSULE BY MOUTH ONCE DAILY BEFORE BREAKFAST . APPOINTMENT REQUIRED FOR FUTURE REFILLS 90 capsule 3  . polyethylene glycol (MIRALAX / GLYCOLAX) 17 g packet Take 17 g by mouth daily as needed for moderate constipation.    . sertraline (ZOLOFT) 100 MG tablet Take 1 tablet (100 mg total) by mouth daily. 90 tablet 3  . sevelamer carbonate (RENVELA) 800 MG tablet Take 1,600 mg by mouth 3 (three) times daily with meals.    . traZODone (DESYREL) 50 MG tablet Take 50 mg by mouth at bedtime.     No current facility-administered medications for this visit.    Physical Exam:     BP 124/60   Pulse 78   Ht '5\' 7"'  (1.702 m)   Wt  137 lb (62.1 kg)   LMP 03/16/2002   BMI 21.46 kg/m   GENERAL:  Pleasant female in NAD PSYCH: : Cooperative, normal affect CARDIAC:  RRR PULM: Normal respiratory effort, lungs CTA bilaterally, no wheezing ABDOMEN:  Nondistended, soft, nontender. No obvious masses, no hepatomegaly,  normal bowel sounds SKIN:  turgor, no lesions seen Musculoskeletal:  Normal muscle tone, normal strength NEURO: Alert and oriented x 3, no focal neurologic deficits   Tye Savoy , NP 11/23/2019, 9:38 AM

## 2019-11-23 NOTE — Progress Notes (Signed)
Reviewed and agree with documentation and assessment and plan. K. Veena Mauricia Mertens , MD   

## 2019-12-05 ENCOUNTER — Telehealth: Payer: Self-pay | Admitting: Cardiology

## 2019-12-05 NOTE — Telephone Encounter (Signed)
I attempted to contact patient to schedule follow up visit on 12/05/19 with Dr.Schumann from patients recall list. The patient didn't answer so I left message for patient to return call to get appointment scheduled

## 2019-12-15 DIAGNOSIS — D631 Anemia in chronic kidney disease: Secondary | ICD-10-CM | POA: Diagnosis not present

## 2019-12-15 DIAGNOSIS — D509 Iron deficiency anemia, unspecified: Secondary | ICD-10-CM | POA: Diagnosis not present

## 2019-12-15 DIAGNOSIS — N2581 Secondary hyperparathyroidism of renal origin: Secondary | ICD-10-CM | POA: Diagnosis not present

## 2019-12-15 DIAGNOSIS — Z992 Dependence on renal dialysis: Secondary | ICD-10-CM | POA: Diagnosis not present

## 2019-12-15 DIAGNOSIS — E1129 Type 2 diabetes mellitus with other diabetic kidney complication: Secondary | ICD-10-CM | POA: Diagnosis not present

## 2019-12-15 DIAGNOSIS — N2589 Other disorders resulting from impaired renal tubular function: Secondary | ICD-10-CM | POA: Diagnosis not present

## 2019-12-15 DIAGNOSIS — N186 End stage renal disease: Secondary | ICD-10-CM | POA: Diagnosis not present

## 2019-12-19 DIAGNOSIS — N2589 Other disorders resulting from impaired renal tubular function: Secondary | ICD-10-CM | POA: Diagnosis not present

## 2019-12-19 DIAGNOSIS — Z992 Dependence on renal dialysis: Secondary | ICD-10-CM | POA: Diagnosis not present

## 2019-12-19 DIAGNOSIS — N186 End stage renal disease: Secondary | ICD-10-CM | POA: Diagnosis not present

## 2019-12-22 DIAGNOSIS — N186 End stage renal disease: Secondary | ICD-10-CM | POA: Diagnosis not present

## 2019-12-22 DIAGNOSIS — N2581 Secondary hyperparathyroidism of renal origin: Secondary | ICD-10-CM | POA: Diagnosis not present

## 2019-12-22 DIAGNOSIS — E1129 Type 2 diabetes mellitus with other diabetic kidney complication: Secondary | ICD-10-CM | POA: Diagnosis not present

## 2019-12-22 DIAGNOSIS — Z992 Dependence on renal dialysis: Secondary | ICD-10-CM | POA: Diagnosis not present

## 2019-12-22 DIAGNOSIS — D631 Anemia in chronic kidney disease: Secondary | ICD-10-CM | POA: Diagnosis not present

## 2019-12-22 DIAGNOSIS — D509 Iron deficiency anemia, unspecified: Secondary | ICD-10-CM | POA: Diagnosis not present

## 2019-12-24 DIAGNOSIS — Z992 Dependence on renal dialysis: Secondary | ICD-10-CM | POA: Diagnosis not present

## 2019-12-24 DIAGNOSIS — N186 End stage renal disease: Secondary | ICD-10-CM | POA: Diagnosis not present

## 2019-12-24 DIAGNOSIS — N2581 Secondary hyperparathyroidism of renal origin: Secondary | ICD-10-CM | POA: Diagnosis not present

## 2019-12-24 DIAGNOSIS — E1129 Type 2 diabetes mellitus with other diabetic kidney complication: Secondary | ICD-10-CM | POA: Diagnosis not present

## 2019-12-24 DIAGNOSIS — D509 Iron deficiency anemia, unspecified: Secondary | ICD-10-CM | POA: Diagnosis not present

## 2019-12-24 DIAGNOSIS — D631 Anemia in chronic kidney disease: Secondary | ICD-10-CM | POA: Diagnosis not present

## 2019-12-27 DIAGNOSIS — D509 Iron deficiency anemia, unspecified: Secondary | ICD-10-CM | POA: Diagnosis not present

## 2019-12-27 DIAGNOSIS — Z992 Dependence on renal dialysis: Secondary | ICD-10-CM | POA: Diagnosis not present

## 2019-12-27 DIAGNOSIS — D631 Anemia in chronic kidney disease: Secondary | ICD-10-CM | POA: Diagnosis not present

## 2019-12-27 DIAGNOSIS — N2581 Secondary hyperparathyroidism of renal origin: Secondary | ICD-10-CM | POA: Diagnosis not present

## 2019-12-27 DIAGNOSIS — N186 End stage renal disease: Secondary | ICD-10-CM | POA: Diagnosis not present

## 2019-12-27 DIAGNOSIS — E1129 Type 2 diabetes mellitus with other diabetic kidney complication: Secondary | ICD-10-CM | POA: Diagnosis not present

## 2019-12-27 NOTE — Progress Notes (Signed)
Cardiology Office Note:    Date:  12/28/2019   ID:  Brandy Houston, DOB [birth year], MRN [id number]  PCP:  [name], MD  Cardiologist:  No primary care provider on file.  Electrophysiologist:  None   Referring MD: [name], MD   Chief Complaint  Patient presents with   Chest Pain   History of Present Illness:    Brandy Houston is a 64 y.o. female with a hx of ESRD on HD, type 2 diabetes, moderate aortic regurgitation, hypertension, anemia, septic arthritis s/p L AKA who presents for follow-up.  She was referred by Dr. Jimmy Footman for an evaluation of chest pain on 04/04/2019.  She presented to the ED on 11/27/2018 with chest pain.  Work-up was unremarkable but was notable for low hemoglobin (7.1).  States that since that time she has continued to have intermittent left-sided chest pain.  Occurs about 2 times per week.  Typically occurs after HD.  Describes as cramping pain, 5-6 out of 10 in intensity, worse with inspiration.  Last 5 to 10 minutes.  Not related to exertion.  However, states that she also has a second type of chest pain that she gets when she overexerts herself.  Describes as substernal chest tightness, resolves with rest.  States that she does not get this pain often, only when she overexerts herself in physical therapy.  Last TEE in 07/2014 for bacteremia showed no vegetation, normal LVEF, moderate AI.  Given her symptoms, Carl[facility] Adam Myoview was done on 04/15/2019, which showed small fixed defect at apex, otherwise no abnormalities.  TTE on 04/13/2019 showed mild aortic stenosis, moderate aortic regurgitation, normal EF, grade 2 diastolic dysfunction, normal RV function.  Since last clinic visit, she was admitted from 5/8 through 10/23/2019.  She had an AV graft bleeding during HD.  Hemoglobin 6.0 on presentation.  Required 2 units PRBCs.  Also reported chest pain with positive flat troponin that was thought to be due to demand ischemia from low hemoglobin.  She continues to  have cramping chest pain after dialysis.  She has been going for short walks, will walk about two blocks.  Does report occasional chest pain with this, unchanged from prior.  She denies any dyspnea, lightheadedness, syncope, or lower extremity edema.  Does report she has been having episodes of palpitations where she feels like her heart is racing.  Will typically last for about 5 minutes.   Past Medical History:  Diagnosis Date   Allergy    Anemia    Anxiety    Arthritis    "in my joints" (03/10/2017)   Chronic diastolic CHF (congestive heart failure) ([place])    CKD (chronic kidney disease), stage IV ([place])    stage IV. previous HD, none currently 10/30/15 (confirmed 02/05/2017 & 03/10/2017)   Depression    Chronic   Diabetic peripheral neuropathy ([place])    Dysrhythmia    tachycardia, normal ECHO 08-09-14   GERD (gastroesophageal reflux disease)    Heart murmur     dx'd 02/05/2017   Hepatitis C    "tx'd in 2016; I'm negative now" (02/05/2017)   History of blood transfusion 2017   "w/knee replacement"   Hyperlipidemia    Hypertension    Osteoarthritis of left knee    Peripheral neuropathy    Protein calorie malnutrition ([place])    Septic arthritis ([place])    Slow transit constipation    Type II diabetes mellitus ([place])    IDDM   Uncontrolled hypertension 02/18/2013  Unsteady gait     Past Surgical History:  Procedure Laterality Date   A/V FISTULAGRAM Right 07/07/2018   Procedure: A/V FISTULAGRAM;  Surgeon: Marty Heck, MD;  Location: [facility];  Service: Cardiovascular;  Laterality: Right;   AMPUTATION Left 03/20/2017   Procedure: LEFT ABOVE KNEE AMPUTATION;  Surgeon: Newt Minion, MD;  Location: [place];  Service: Orthopedics;  Laterality: Left;   APPENDECTOMY     AV FISTULA PLACEMENT Left 09/13/2014   Procedure: Brachial Artery to Brachial Vein Gortex Four - Seven Stretch GRAFT INSERTION Left Forearm;  Surgeon: Mal Misty, MD;  Location: [place];  Service: Vascular;  Laterality: Left;   [place] Right 10/26/2017   Procedure: BASILIC VEIN TRANSPOSITION FIRST STAGE RIGHT ARM;  Surgeon: Conrad [place], MD;  Location: [place];  Service: Vascular;  Laterality: Right;   [place] Right 02/01/2018   Procedure: SECOND STAGE BASILIC VEIN TRANSPOSITION RIGHT UPPER EXTREMITY;  Surgeon: Marty Heck, MD;  Location: [place];  Service: Vascular;  Laterality: Right;   CHOLECYSTECTOMY OPEN     COLON SURGERY     COLONOSCOPY     EXCISIONAL TOTAL KNEE ARTHROPLASTY WITH ANTIBIOTIC SPACERS Left 02/06/2017   Procedure: Incisional total iknee with antibiotic spacer ;  Surgeon: Leandrew Koyanagi, MD;  Location: [place];  Service: Orthopedics;  Laterality: Left;   IR FLUORO GUIDE CV LINE RIGHT  02/10/2017   IR REMOVAL TUN CV CATH W/O FL  04/01/2017   IR US GUIDE VASC ACCESS RIGHT  02/10/2017   IRRIGATION AND DEBRIDEMENT KNEE Left 03/12/2017   Procedure: IRRIGATION AND DEBRIDEMENT LEFT KNEE WITH WOUND VAC APPLICATION;  Surgeon: Leandrew Koyanagi, MD;  Location: [place];  Service: Orthopedics;  Laterality: Left;   JOINT REPLACEMENT     KNEE ARTHROSCOPY Right 08/10/2014   Procedure: ARTHROSCOPY I & D KNEE;  Surgeon: Marianna Payment, MD;  Location: [facility];  Service: Orthopedics;  Laterality: Right;   KNEE ARTHROSCOPY Left 08/11/2014   Procedure: ARTHROSCOPIC WASHOUT LEFT KNEE;  Surgeon: Marianna Payment, MD;  Location: [place];  Service: Orthopedics;  Laterality: Left;   KNEE ARTHROSCOPY Left 08/19/2014   Procedure: ARTHROSCOPIC WASHOUT LEFT KNEE;  Surgeon: Leandrew Koyanagi, MD;  Location: [place];  Service: Orthopedics;  Laterality: Left;   KNEE ARTHROSCOPY WITH LATERAL MENISECTOMY Left 04/04/2015   Procedure: AND PARTIAL LATERAL MENISECTOMY;  Surgeon: Leandrew Koyanagi, MD;  Location: [place];  Service: Orthopedics;  Laterality: Left;   KNEE ARTHROSCOPY WITH MEDIAL MENISECTOMY Left 04/04/2015   Procedure: LEFT  KNEE ARTHROSCOPY WITH PARTIAL MEDIAL MENISCECTOMY  AND SYNOVECTOMY;  Surgeon: Leandrew Koyanagi, MD;  Location: [place];  Service: Orthopedics;  Laterality: Left;   PERIPHERAL VASCULAR BALLOON ANGIOPLASTY  07/07/2018   Procedure: PERIPHERAL VASCULAR BALLOON ANGIOPLASTY;  Surgeon: Marty Heck, MD;  Location: [facility];  Service: Cardiovascular;;  right AV fistula   POLYPECTOMY     SHOULDER ARTHROSCOPY Bilateral 08/10/2014   Procedure: I & D BILATERAL SHOULDERS ;  Surgeon: Marianna Payment, MD;  Location[facility];  Service: Orthopedics;  Laterality: Bilateral;   SMALL INTESTINE SURGERY     Due to Small Bowel Obstruction; "fixed it when they did my gallbladder OR"   TEE WITHOUT CARDIOVERSION N/A 08/14/2014   Procedure: TRANSESOPHAGEAL ECHOCARDIOGRAM (TEE);  Surgeon: Thayer Headings, MD;  Location: [place];  Service: Cardiovascular;  Laterality: N/A;   TENOSYNOVECTOMY Right 08/11/2014  Procedure: RIGHT WRIST IRRIGATION AND DEBRIDEMENT, TENOSYNOVECTOMY;  Surgeon: Marianna Payment, MD;  Location: [place];  Service: Orthopedics;  Laterality: Right;   TOTAL KNEE ARTHROPLASTY Left 11/07/2015   Procedure: LEFT TOTAL KNEE ARTHROPLASTY WITH REVISION OF IMPLANTS;  Surgeon: Leandrew Koyanagi, MD;  Location: [place];  Service: Orthopedics;  Laterality: Left;   TUBAL LIGATION      Current Medications: Current Meds  Medication Sig   Accu-Chek Softclix Lancets lancets Check sugars three times a day for E11.65   amLODipine (NORVASC) 10 MG tablet Take 1 tablet (10 mg total) by mouth at bedtime.   aspirin EC 81 MG tablet Take 81 mg by mouth daily.   atorvastatin (LIPITOR) 10 MG tablet TAKE 1 TABLET BY MOUTH AT BEDTIME . APPOINTMENT REQUIRED FOR FUTURE REFILLS   blood glucose meter kit and supplies KIT Dispense based on patient and insurance preference. Use up to four times daily as directed   Blood Glucose Monitoring Suppl (ACCU-CHEK GUIDE) w/Device KIT 1 Device by Does  not apply route daily.   calcium acetate (PHOSLO) 667 MG capsule Take 2 capsules by mouth 3 (three) times daily.   Chlorpheniramine Maleate (ALLERGY PO) Take 1 tablet by mouth daily as needed (allergies).   cloNIDine (CATAPRES) 0.1 MG tablet    cloNIDine (CATAPRES) 0.2 MG tablet Take 0.2 mg by mouth 2 (two) times daily.   Ferrous Sulfate (IRON PO) Take 325 mg by mouth daily.    gabapentin (NEURONTIN) 100 MG capsule Take 200 mg by mouth 3 (three) times daily.   glucose blood (ACCU-CHEK GUIDE) test strip Check blood sugar up to 4 times a day   hydrALAZINE (APRESOLINE) 100 MG tablet Take 100 mg by mouth as directed. Take only on non dialysis day   Lancet Devices (ACCU-CHEK SOFTCLIX) lancets Check sugars TID for E11.65   lidocaine-prilocaine (EMLA) cream Apply 1 application topically once.    methocarbamol (ROBAXIN) 500 MG tablet Take 500 mg by mouth every 8 (eight) hours as needed for muscle spasms.    Methoxy PEG-Epoetin Beta (MIRCERA IJ) Mircera   multivitamin (RENA-VIT) TABS tablet Take 1 tablet by mouth daily.   omeprazole (PRILOSEC) 20 MG capsule TAKE 1 CAPSULE BY MOUTH ONCE DAILY BEFORE BREAKFAST . APPOINTMENT REQUIRED FOR FUTURE REFILLS   polyethylene glycol (MIRALAX / GLYCOLAX) 17 g packet Take 17 g by mouth daily as needed for moderate constipation.   sertraline (ZOLOFT) 100 MG tablet Take 1 tablet (100 mg total) by mouth daily.   sevelamer carbonate (RENVELA) 800 MG tablet Take 1,600 mg by mouth 3 (three) times daily with meals.   traZODone (DESYREL) 50 MG tablet Take 50 mg by mouth at bedtime.     Allergies:   Compazine [prochlorperazine], Shellfish-derived products, Iodinated diagnostic agents, Omnipaque [iohexol], Tape, and Sulfa antibiotics   Social History   Socioeconomic History   Marital status: Single    Spouse name: Not on file   Number of children: 3   Years of education: Not on file   Highest education level: Not on file  Occupational History    Occupation: disabled  Tobacco Use   Smoking status: Current Every Day Smoker    Packs/day: 0.50    Years: 35.00    Pack years: 17.50    Types: Cigarettes   Smokeless tobacco: Never Used  Vaping Use   Vaping Use: Never used  Substance and Sexual Activity   Alcohol use: Not Currently    Alcohol/week: 0.0 standard drinks    Comment: 03/10/2017 "  I drink a wine cooler a few times/year"   Drug use: No   Sexual activity: Not Currently  Other Topics Concern   Not on file  Social History Narrative   Not on file   Social Determinants of Health   Financial Resource Strain:    Difficulty of Paying Living Expenses:   Food Insecurity:    Worried About Charity fundraiser in the Last Year:    Arboriculturist in the Last Year:   Transportation Needs:    Film/video editor (Medical):    Lack of Transportation (Non-Medical):   Physical Activity:    Days of Exercise per Week:    Minutes of Exercise per Session:   Stress:    Feeling of Stress :   Social Connections:    Frequency of Communication with Friends and Family:    Frequency of Social Gatherings with Friends and Family:    Attends Religious Services:    Active Member of Clubs or Organizations:    Attends Music therapist:    Marital Status:      Family History: [name] family history includes Cancer in her brother; Diabetes in her brother; Heart disease in her father; Hypertension in her brother and sister; Uterine cancer in her mother and sister. There is no history of Colon cancer, Colon polyps, Esophageal cancer, Rectal cancer, or Stomach cancer.  ROS:   Please see the history of present illness.    All other systems reviewed and are negative.  EKGs/Labs/Other Studies Reviewed:    The following studies were reviewed today:   EKG:  EKG is  Not ordered today.  The last ekg ordered today demonstrates normal sinus rhythm, rate 81, T wave inversions in leads V5/6, QTc  497  Recent Labs: 06/05/2019: Magnesium 2.7 10/22/2019: ALT 20; BUN 18; Creatinine, Ser 3.07; Potassium 3.0; Sodium 138 10/23/2019: Hemoglobin 8.6; Platelets 176  Recent Lipid Panel    Component Value Date/Time   CHOL 111 12/10/2018 1133   TRIG 82.0 12/10/2018 1133   HDL 61.40 12/10/2018 1133   CHOLHDL 2 12/10/2018 1133   VLDL 16.4 12/10/2018 1133   LDLCALC 33 12/10/2018 1133    [place] 03/3019:  Nuclear stress EF: 51%. No wall motion abnormalities. Low normal ejection fraction.  There was no ST segment deviation noted during stress.  Defect 1: There is a small fixed defect of mild severity at the apex. No evidence of ischemia identified.  This is a low risk study. No ischemia identified.  TTE 04/13/19:   1. The AoV is tricuspid and moderately calcified. Mild aortic stenosis is  present with Vmax 2.9 mmHg and mean gradient 18 mmHg. Moderate aortic  regurgitation is present with PHT 352 msec. The aortic root is normal in  size. This appears to be  degenerative calcific AoV disease.  2. Left ventricular ejection fraction, by visual estimation, is 60 to  65%. The left ventricle has normal function. There is moderately increased  left ventricular hypertrophy.  3. Elevated left atrial pressure.  4. Left ventricular diastolic parameters are consistent with Grade II  diastolic dysfunction (pseudonormalization).  5. The left ventricle has no regional wall motion abnormalities.  6. Global right ventricle has normal systolic function.The right  ventricular size is normal. No increase in right ventricular wall  thickness.  7. Trivial pericardial effusion is present.  8. Mild calcification of the anterior mitral valve leaflet(s).  9. Mild mitral annular calcification.  10. Mild thickening of the anterior  and posterior mitral valve leaflet(s).  11. The mitral valve is degenerative. Trace mitral valve regurgitation.  12. The tricuspid valve is grossly normal.  Tricuspid valve regurgitation  is trivial.  13. Aortic valve regurgitation is moderate.  14. The aortic valve is tricuspid. Aortic valve regurgitation is moderate.  Mild aortic valve stenosis.  15. There is Moderate calcification of the aortic valve.  16. There is Moderate thickening of the aortic valve.  17. The pulmonic valve was grossly normal. Pulmonic valve regurgitation is  trivial.  18. Mild plaque invoving the ascending aorta.  19. TR signal is inadequate for assessing pulmonary artery systolic  pressure.  20. The inferior vena cava is normal in size with greater than 50%  respiratory variability, suggesting right atrial pressure of 3 mmHg.   Physical Exam:    VS:  BP 120/64    Pulse 81    Temp (!) 97.1 F (36.2 C)    Ht '5\' 7"'  (1.702 m)    Wt 144 lb (65.3 kg)    LMP 03/16/2002    SpO2 96%    BMI 22.55 kg/m     Wt Readings from Last 3 Encounters:  12/28/19 144 lb (65.3 kg)  11/23/19 137 lb (62.1 kg)  10/31/19 147 lb 3.2 oz (66.8 kg)     GEN: in no acute distress HEENT: Normal NECK: No JVD; No carotid bruits LYMPHATICS: No lymphadenopathy CARDIAC: RRR, 3/6 systolic murmur RESPIRATORY:  Clear to auscultation without rales, wheezing or rhonchi  ABDOMEN: Soft, non-tender, non-distended MUSCULOSKELETAL:  No edema. S/p L AKA SKIN: Warm and dry NEUROLOGIC:  Alert and oriented x 3 PSYCHIATRIC:  Normal affect   ASSESSMENT:    1. Chest pain of uncertain etiology   2. Aortic valve insufficiency, etiology of cardiac valve disease unspecified   3. Essential hypertension   4. Palpitations    PLAN:     Chest pain: Description consistent with 2 types of chest pain.  First type is cramping left-sided pain that occurs after dialysis at rest.  Suspect noncardiac chest pain, likely muscle cramps related to HD.  Second type of chest pain is concerning for typical angina.  Describes substernal chest tightness that occurs with exertion and resolves with rest.  However, no evidence of  ischemia on Lexiscan Myoview and patient reports chest pain has improved  Palpitations: Description concerning for arrhythmia, will evaluate with Zio patch x2 weeks  Aortic regurgitation: Moderate on TTE in 03/2019.  Also with mild aortic stenosis.  Will need annual TTE for monitoring, will repeat on 03/2020  Hypertension: On amlodipine 10 mg daily, hydralazine 100 mg 3 times daily on non-HD days, clonidine 0.2 mg twice daily.  Appears controlled  Type 2 diabetes: On insulin.  Last A1c 6.3 on 02/28/2019  HLD: On atorvastatin 10 mg daily.  Last LDL 33 on 12/10/2018  ESRD: On HD  RTC in 3 months  Medication Adjustments/Labs and Tests Ordered: Current medicines are reviewed at length with the patient today.  Concerns regarding medicines are outlined above.  Orders Placed This Encounter  Procedures   LONG TERM MONITOR (3-14 DAYS)   EKG 12-Lead   No orders of the defined types were placed in this encounter.   Patient Instructions  Medication Instructions:  Your physician recommends that you continue on your current medications as directed. Please refer to the Current Medication list given to you today.  *If you need a refill on your cardiac medications before your next appointment, please call your pharmacy*  Testing/Procedures:  Your physician has requested that you have an echocardiogram in OCTOBER. Echocardiography is a painless test that uses sound waves to create images of your heart. It provides your doctor with information about the size and shape of your heart and how well your hearts chambers and valves are working. This procedure takes approximately one hour. There are no restrictions for this procedure.   ZIO XT- Long Term Monitor Instructions   Your physician has requested you wear your ZIO patch monitor 14 days.   This is a single patch monitor.  [employer] supplies one patch monitor per enrollment.  Additional stickers are not available.   Please do not apply patch if  you will be having a Nuclear Stress Test, Echocardiogram, Cardiac CT, MRI, or Chest Xray during the time frame you would be wearing the monitor. The patch cannot be worn during these tests.  You cannot remove and re-apply the ZIO XT patch monitor.   Your ZIO patch monitor will be sent USPS Priority mail from [facility] directly to your home address. The monitor may also be mailed to a PO BOX if home delivery is not available.   It may take 3-5 days to receive your monitor after you have been enrolled.   Once you have received you monitor, please review enclosed instructions.  Your monitor has already been registered assigning a specific monitor serial # to you.   Applying the monitor   Shave hair from upper left chest.   Hold abrader disc by orange tab.  Rub abrader in 40 strokes over left upper chest as indicated in your monitor instructions.   Clean area with 4 enclosed alcohol pads .  Use all pads to assure are is cleaned thoroughly.  Let dry.   Apply patch as indicated in monitor instructions.  Patch will be place under collarbone on left side of chest with arrow pointing upward.   Rub patch adhesive wings for 2 minutes.Remove white label marked "1".  Remove white label marked "2".  Rub patch adhesive wings for 2 additional minutes.   While looking in a mirror, press and release button in center of patch.  A small green light will flash 3-4 times .  This will be your only indicator the monitor has been turned on.     Do not shower for the first 24 hours.  You may shower after the first 24 hours.   Press button if you feel a symptom. You will hear a small click.  Record Date, Time and Symptom in the Patient Log Book.   When you are ready to remove patch, follow instructions on last 2 pages of Patient Log Book.  Stick patch monitor onto last page of Patient Log Book.   Place Patient Log Book in [place] box.  Use locking tab on box and tape box closed securely.  The [employer] has [employer] on it.  Please place in mailbox as soon as possible.  Your physician should have your test results approximately 7 days after the monitor has been mailed back to [facility].   Call [place] at [phone] if you have questions regarding your ZIO XT patch monitor.  Call them immediately if you see an orange light blinking on your monitor.   If your monitor falls off in less than 4 days contact our Monitor department at [phone].  If your monitor becomes loose or falls off after 4 days call [employer] at [phone] for suggestions on securing your  monitor.     Follow-Up: At [facility], you and your health needs are our priority.  As part of our continuing mission to provide you with exceptional heart care, we have created designated Provider Care Teams.  These Care Teams include your primary Cardiologist (physician) and Advanced Practice Providers (APPs -  Physician Assistants and Nurse Practitioners) who all work together to provide you with the care you need, when you need it.  We recommend signing up for the patient portal called "MyChart".  Sign up information is provided on this After Visit Summary.  MyChart is used to connect with patients for Virtual Visits (Telemedicine).  Patients are able to view lab/test results, encounter notes, upcoming appointments, etc.  Non-urgent messages can be sent to your provider as well.   To learn more about what you can do with MyChart, go to [email or web address].    Your next appointment:   3 month(s) or after echo  The format for your next appointment:   In Person  Provider:   Oswaldo Milian, MD        Signed, Donato Heinz, MD  12/28/2019 11:49 AM    [place]

## 2019-12-28 ENCOUNTER — Encounter: Payer: Self-pay | Admitting: Cardiology

## 2019-12-28 ENCOUNTER — Telehealth: Payer: Self-pay | Admitting: Radiology

## 2019-12-28 ENCOUNTER — Other Ambulatory Visit: Payer: Self-pay

## 2019-12-28 ENCOUNTER — Ambulatory Visit (INDEPENDENT_AMBULATORY_CARE_PROVIDER_SITE_OTHER): Payer: Medicare Other | Admitting: Cardiology

## 2019-12-28 VITALS — BP 120/64 | HR 81 | Temp 97.1°F | Ht 67.0 in | Wt 144.0 lb

## 2019-12-28 DIAGNOSIS — R002 Palpitations: Secondary | ICD-10-CM

## 2019-12-28 DIAGNOSIS — I10 Essential (primary) hypertension: Secondary | ICD-10-CM

## 2019-12-28 DIAGNOSIS — R079 Chest pain, unspecified: Secondary | ICD-10-CM

## 2019-12-28 DIAGNOSIS — I351 Nonrheumatic aortic (valve) insufficiency: Secondary | ICD-10-CM | POA: Diagnosis not present

## 2019-12-28 NOTE — Telephone Encounter (Signed)
Enrolled patient for a 14 day Zio monitor to be mailed to patients home.  

## 2019-12-28 NOTE — Patient Instructions (Signed)
Medication Instructions:  Your physician recommends that you continue on your current medications as directed. Please refer to the Current Medication list given to you today.  *If you need a refill on your cardiac medications before your next appointment, please call your pharmacy*  Testing/Procedures: Your physician has requested that you have an echocardiogram in OCTOBER. Echocardiography is a painless test that uses sound waves to create images of your heart. It provides your doctor with information about the size and shape of your heart and how well your heart's chambers and valves are working. This procedure takes approximately one hour. There are no restrictions for this procedure.   ZIO XT- Long Term Monitor Instructions   Your physician has requested you wear your ZIO patch monitor 14 days.   This is a single patch monitor.  [employer] supplies one patch monitor per enrollment.  Additional stickers are not available.   Please do not apply patch if you will be having a Nuclear Stress Test, Echocardiogram, Cardiac CT, MRI, or Chest Xray during the time frame you would be wearing the monitor. The patch cannot be worn during these tests.  You cannot remove and re-apply the ZIO XT patch monitor.   Your ZIO patch monitor will be sent USPS Priority mail from [facility] directly to your home address. The monitor may also be mailed to a PO BOX if home delivery is not available.   It may take 3-5 days to receive your monitor after you have been enrolled.   Once you have received you monitor, please review enclosed instructions.  Your monitor has already been registered assigning a specific monitor serial # to you.   Applying the monitor   Shave hair from upper left chest.   Hold abrader disc by orange tab.  Rub abrader in 40 strokes over left upper chest as indicated in your monitor instructions.   Clean area with 4 enclosed alcohol pads .  Use all pads to assure are is cleaned  thoroughly.  Let dry.   Apply patch as indicated in monitor instructions.  Patch will be place under collarbone on left side of chest with arrow pointing upward.   Rub patch adhesive wings for 2 minutes.Remove white label marked "1".  Remove white label marked "2".  Rub patch adhesive wings for 2 additional minutes.   While looking in a mirror, press and release button in center of patch.  A small green light will flash 3-4 times .  This will be your only indicator the monitor has been turned on.     Do not shower for the first 24 hours.  You may shower after the first 24 hours.   Press button if you feel a symptom. You will hear a small click.  Record Date, Time and Symptom in the Patient Log Book.   When you are ready to remove patch, follow instructions on last 2 pages of Patient Log Book.  Stick patch monitor onto last page of Patient Log Book.   Place Patient Log Book in [place] box.  Use locking tab on box and tape box closed securely.  The [employer] has [employer] on it.  Please place in mailbox as soon as possible.  Your physician should have your test results approximately 7 days after the monitor has been mailed back to [facility].   Call [place] at [phone] if you have questions regarding your ZIO XT patch monitor.  Call them immediately if you see an  orange light blinking on your monitor.   If your monitor falls off in less than 4 days contact our Monitor department at [phone].  If your monitor becomes loose or falls off after 4 days call [employer] at [phone] for suggestions on securing your monitor.     Follow-Up: At [facility], you and your health needs are our priority.  As part of our continuing mission to provide you with exceptional heart care, we have created designated Provider Care Teams.  These Care Teams include your primary Cardiologist (physician) and Advanced Practice Providers (APPs -  Physician Assistants and  Nurse Practitioners) who all work together to provide you with the care you need, when you need it.  We recommend signing up for the patient portal called "MyChart".  Sign up information is provided on this After Visit Summary.  MyChart is used to connect with patients for Virtual Visits (Telemedicine).  Patients are able to view lab/test results, encounter notes, upcoming appointments, etc.  Non-urgent messages can be sent to your provider as well.   To learn more about what you can do with MyChart, go to [email or web address].    Your next appointment:   3 month(s) or after echo  The format for your next appointment:   In Person  Provider:   Oswaldo Milian, MD

## 2019-12-29 DIAGNOSIS — D631 Anemia in chronic kidney disease: Secondary | ICD-10-CM | POA: Diagnosis not present

## 2019-12-29 DIAGNOSIS — D509 Iron deficiency anemia, unspecified: Secondary | ICD-10-CM | POA: Diagnosis not present

## 2019-12-29 DIAGNOSIS — E1129 Type 2 diabetes mellitus with other diabetic kidney complication: Secondary | ICD-10-CM | POA: Diagnosis not present

## 2019-12-29 DIAGNOSIS — N186 End stage renal disease: Secondary | ICD-10-CM | POA: Diagnosis not present

## 2019-12-29 DIAGNOSIS — N2581 Secondary hyperparathyroidism of renal origin: Secondary | ICD-10-CM | POA: Diagnosis not present

## 2019-12-29 DIAGNOSIS — Z992 Dependence on renal dialysis: Secondary | ICD-10-CM | POA: Diagnosis not present

## 2019-12-31 DIAGNOSIS — D509 Iron deficiency anemia, unspecified: Secondary | ICD-10-CM | POA: Diagnosis not present

## 2019-12-31 DIAGNOSIS — Z992 Dependence on renal dialysis: Secondary | ICD-10-CM | POA: Diagnosis not present

## 2019-12-31 DIAGNOSIS — D631 Anemia in chronic kidney disease: Secondary | ICD-10-CM | POA: Diagnosis not present

## 2019-12-31 DIAGNOSIS — E1129 Type 2 diabetes mellitus with other diabetic kidney complication: Secondary | ICD-10-CM | POA: Diagnosis not present

## 2019-12-31 DIAGNOSIS — N186 End stage renal disease: Secondary | ICD-10-CM | POA: Diagnosis not present

## 2019-12-31 DIAGNOSIS — N2581 Secondary hyperparathyroidism of renal origin: Secondary | ICD-10-CM | POA: Diagnosis not present

## 2020-01-03 DIAGNOSIS — N186 End stage renal disease: Secondary | ICD-10-CM | POA: Diagnosis not present

## 2020-01-03 DIAGNOSIS — E1129 Type 2 diabetes mellitus with other diabetic kidney complication: Secondary | ICD-10-CM | POA: Diagnosis not present

## 2020-01-03 DIAGNOSIS — D631 Anemia in chronic kidney disease: Secondary | ICD-10-CM | POA: Diagnosis not present

## 2020-01-03 DIAGNOSIS — Z992 Dependence on renal dialysis: Secondary | ICD-10-CM | POA: Diagnosis not present

## 2020-01-03 DIAGNOSIS — N2581 Secondary hyperparathyroidism of renal origin: Secondary | ICD-10-CM | POA: Diagnosis not present

## 2020-01-03 DIAGNOSIS — D509 Iron deficiency anemia, unspecified: Secondary | ICD-10-CM | POA: Diagnosis not present

## 2020-01-04 ENCOUNTER — Encounter: Payer: Self-pay | Admitting: Gastroenterology

## 2020-01-05 DIAGNOSIS — Z992 Dependence on renal dialysis: Secondary | ICD-10-CM | POA: Diagnosis not present

## 2020-01-05 DIAGNOSIS — D631 Anemia in chronic kidney disease: Secondary | ICD-10-CM | POA: Diagnosis not present

## 2020-01-05 DIAGNOSIS — D509 Iron deficiency anemia, unspecified: Secondary | ICD-10-CM | POA: Diagnosis not present

## 2020-01-05 DIAGNOSIS — E1129 Type 2 diabetes mellitus with other diabetic kidney complication: Secondary | ICD-10-CM | POA: Diagnosis not present

## 2020-01-05 DIAGNOSIS — N186 End stage renal disease: Secondary | ICD-10-CM | POA: Diagnosis not present

## 2020-01-05 DIAGNOSIS — N2581 Secondary hyperparathyroidism of renal origin: Secondary | ICD-10-CM | POA: Diagnosis not present

## 2020-01-07 DIAGNOSIS — N2581 Secondary hyperparathyroidism of renal origin: Secondary | ICD-10-CM | POA: Diagnosis not present

## 2020-01-07 DIAGNOSIS — E1129 Type 2 diabetes mellitus with other diabetic kidney complication: Secondary | ICD-10-CM | POA: Diagnosis not present

## 2020-01-07 DIAGNOSIS — D631 Anemia in chronic kidney disease: Secondary | ICD-10-CM | POA: Diagnosis not present

## 2020-01-07 DIAGNOSIS — N186 End stage renal disease: Secondary | ICD-10-CM | POA: Diagnosis not present

## 2020-01-07 DIAGNOSIS — Z992 Dependence on renal dialysis: Secondary | ICD-10-CM | POA: Diagnosis not present

## 2020-01-07 DIAGNOSIS — D509 Iron deficiency anemia, unspecified: Secondary | ICD-10-CM | POA: Diagnosis not present

## 2020-01-09 ENCOUNTER — Ambulatory Visit (INDEPENDENT_AMBULATORY_CARE_PROVIDER_SITE_OTHER): Payer: Medicare Other

## 2020-01-09 DIAGNOSIS — R002 Palpitations: Secondary | ICD-10-CM | POA: Diagnosis not present

## 2020-01-10 DIAGNOSIS — D631 Anemia in chronic kidney disease: Secondary | ICD-10-CM | POA: Diagnosis not present

## 2020-01-10 DIAGNOSIS — D509 Iron deficiency anemia, unspecified: Secondary | ICD-10-CM | POA: Diagnosis not present

## 2020-01-10 DIAGNOSIS — N186 End stage renal disease: Secondary | ICD-10-CM | POA: Diagnosis not present

## 2020-01-10 DIAGNOSIS — Z992 Dependence on renal dialysis: Secondary | ICD-10-CM | POA: Diagnosis not present

## 2020-01-10 DIAGNOSIS — N2581 Secondary hyperparathyroidism of renal origin: Secondary | ICD-10-CM | POA: Diagnosis not present

## 2020-01-10 DIAGNOSIS — E1129 Type 2 diabetes mellitus with other diabetic kidney complication: Secondary | ICD-10-CM | POA: Diagnosis not present

## 2020-01-12 DIAGNOSIS — E1129 Type 2 diabetes mellitus with other diabetic kidney complication: Secondary | ICD-10-CM | POA: Diagnosis not present

## 2020-01-12 DIAGNOSIS — N2581 Secondary hyperparathyroidism of renal origin: Secondary | ICD-10-CM | POA: Diagnosis not present

## 2020-01-12 DIAGNOSIS — D509 Iron deficiency anemia, unspecified: Secondary | ICD-10-CM | POA: Diagnosis not present

## 2020-01-12 DIAGNOSIS — Z992 Dependence on renal dialysis: Secondary | ICD-10-CM | POA: Diagnosis not present

## 2020-01-12 DIAGNOSIS — D631 Anemia in chronic kidney disease: Secondary | ICD-10-CM | POA: Diagnosis not present

## 2020-01-12 DIAGNOSIS — N186 End stage renal disease: Secondary | ICD-10-CM | POA: Diagnosis not present

## 2020-01-14 DIAGNOSIS — D631 Anemia in chronic kidney disease: Secondary | ICD-10-CM | POA: Diagnosis not present

## 2020-01-14 DIAGNOSIS — N186 End stage renal disease: Secondary | ICD-10-CM | POA: Diagnosis not present

## 2020-01-14 DIAGNOSIS — Z992 Dependence on renal dialysis: Secondary | ICD-10-CM | POA: Diagnosis not present

## 2020-01-14 DIAGNOSIS — N2581 Secondary hyperparathyroidism of renal origin: Secondary | ICD-10-CM | POA: Diagnosis not present

## 2020-01-14 DIAGNOSIS — D509 Iron deficiency anemia, unspecified: Secondary | ICD-10-CM | POA: Diagnosis not present

## 2020-01-14 DIAGNOSIS — E1129 Type 2 diabetes mellitus with other diabetic kidney complication: Secondary | ICD-10-CM | POA: Diagnosis not present

## 2020-01-15 DIAGNOSIS — E1129 Type 2 diabetes mellitus with other diabetic kidney complication: Secondary | ICD-10-CM | POA: Diagnosis not present

## 2020-01-15 DIAGNOSIS — N186 End stage renal disease: Secondary | ICD-10-CM | POA: Diagnosis not present

## 2020-01-15 DIAGNOSIS — Z992 Dependence on renal dialysis: Secondary | ICD-10-CM | POA: Diagnosis not present

## 2020-01-17 DIAGNOSIS — D509 Iron deficiency anemia, unspecified: Secondary | ICD-10-CM | POA: Diagnosis not present

## 2020-01-17 DIAGNOSIS — E1129 Type 2 diabetes mellitus with other diabetic kidney complication: Secondary | ICD-10-CM | POA: Diagnosis not present

## 2020-01-17 DIAGNOSIS — Z992 Dependence on renal dialysis: Secondary | ICD-10-CM | POA: Diagnosis not present

## 2020-01-17 DIAGNOSIS — N186 End stage renal disease: Secondary | ICD-10-CM | POA: Diagnosis not present

## 2020-01-17 DIAGNOSIS — N2581 Secondary hyperparathyroidism of renal origin: Secondary | ICD-10-CM | POA: Diagnosis not present

## 2020-01-17 DIAGNOSIS — D631 Anemia in chronic kidney disease: Secondary | ICD-10-CM | POA: Diagnosis not present

## 2020-01-19 DIAGNOSIS — D509 Iron deficiency anemia, unspecified: Secondary | ICD-10-CM | POA: Diagnosis not present

## 2020-01-19 DIAGNOSIS — N2581 Secondary hyperparathyroidism of renal origin: Secondary | ICD-10-CM | POA: Diagnosis not present

## 2020-01-19 DIAGNOSIS — N186 End stage renal disease: Secondary | ICD-10-CM | POA: Diagnosis not present

## 2020-01-19 DIAGNOSIS — D631 Anemia in chronic kidney disease: Secondary | ICD-10-CM | POA: Diagnosis not present

## 2020-01-19 DIAGNOSIS — E1129 Type 2 diabetes mellitus with other diabetic kidney complication: Secondary | ICD-10-CM | POA: Diagnosis not present

## 2020-01-19 DIAGNOSIS — Z992 Dependence on renal dialysis: Secondary | ICD-10-CM | POA: Diagnosis not present

## 2020-01-21 DIAGNOSIS — E1129 Type 2 diabetes mellitus with other diabetic kidney complication: Secondary | ICD-10-CM | POA: Diagnosis not present

## 2020-01-21 DIAGNOSIS — N2581 Secondary hyperparathyroidism of renal origin: Secondary | ICD-10-CM | POA: Diagnosis not present

## 2020-01-21 DIAGNOSIS — D631 Anemia in chronic kidney disease: Secondary | ICD-10-CM | POA: Diagnosis not present

## 2020-01-21 DIAGNOSIS — N186 End stage renal disease: Secondary | ICD-10-CM | POA: Diagnosis not present

## 2020-01-21 DIAGNOSIS — Z992 Dependence on renal dialysis: Secondary | ICD-10-CM | POA: Diagnosis not present

## 2020-01-21 DIAGNOSIS — D509 Iron deficiency anemia, unspecified: Secondary | ICD-10-CM | POA: Diagnosis not present

## 2020-01-22 ENCOUNTER — Other Ambulatory Visit: Payer: Self-pay | Admitting: Family Medicine

## 2020-01-22 DIAGNOSIS — F32A Depression, unspecified: Secondary | ICD-10-CM

## 2020-01-24 DIAGNOSIS — D631 Anemia in chronic kidney disease: Secondary | ICD-10-CM | POA: Diagnosis not present

## 2020-01-24 DIAGNOSIS — E1129 Type 2 diabetes mellitus with other diabetic kidney complication: Secondary | ICD-10-CM | POA: Diagnosis not present

## 2020-01-24 DIAGNOSIS — N186 End stage renal disease: Secondary | ICD-10-CM | POA: Diagnosis not present

## 2020-01-24 DIAGNOSIS — D509 Iron deficiency anemia, unspecified: Secondary | ICD-10-CM | POA: Diagnosis not present

## 2020-01-24 DIAGNOSIS — Z992 Dependence on renal dialysis: Secondary | ICD-10-CM | POA: Diagnosis not present

## 2020-01-24 DIAGNOSIS — N2581 Secondary hyperparathyroidism of renal origin: Secondary | ICD-10-CM | POA: Diagnosis not present

## 2020-01-25 ENCOUNTER — Encounter (HOSPITAL_COMMUNITY): Payer: Medicare Other

## 2020-01-26 ENCOUNTER — Observation Stay (HOSPITAL_COMMUNITY)
Admission: EM | Admit: 2020-01-26 | Discharge: 2020-01-28 | Disposition: A | Discharge status: Home / Self Care | Payer: Medicare Other | Attending: Internal Medicine | Admitting: Internal Medicine

## 2020-01-26 ENCOUNTER — Other Ambulatory Visit: Payer: Self-pay

## 2020-01-26 ENCOUNTER — Encounter (HOSPITAL_COMMUNITY): Payer: Self-pay

## 2020-01-26 DIAGNOSIS — I5032 Chronic diastolic (congestive) heart failure: Secondary | ICD-10-CM | POA: Diagnosis not present

## 2020-01-26 DIAGNOSIS — Z96652 Presence of left artificial knee joint: Secondary | ICD-10-CM | POA: Insufficient documentation

## 2020-01-26 DIAGNOSIS — D509 Iron deficiency anemia, unspecified: Secondary | ICD-10-CM | POA: Diagnosis not present

## 2020-01-26 DIAGNOSIS — R1032 Left lower quadrant pain: Secondary | ICD-10-CM | POA: Diagnosis not present

## 2020-01-26 DIAGNOSIS — F17210 Nicotine dependence, cigarettes, uncomplicated: Secondary | ICD-10-CM | POA: Insufficient documentation

## 2020-01-26 DIAGNOSIS — I132 Hypertensive heart and chronic kidney disease with heart failure and with stage 5 chronic kidney disease, or end stage renal disease: Secondary | ICD-10-CM | POA: Insufficient documentation

## 2020-01-26 DIAGNOSIS — R5383 Other fatigue: Secondary | ICD-10-CM | POA: Diagnosis present

## 2020-01-26 DIAGNOSIS — K922 Gastrointestinal hemorrhage, unspecified: Secondary | ICD-10-CM | POA: Diagnosis not present

## 2020-01-26 DIAGNOSIS — N186 End stage renal disease: Secondary | ICD-10-CM | POA: Diagnosis not present

## 2020-01-26 DIAGNOSIS — Z79899 Other long term (current) drug therapy: Secondary | ICD-10-CM | POA: Diagnosis not present

## 2020-01-26 DIAGNOSIS — Z992 Dependence on renal dialysis: Secondary | ICD-10-CM | POA: Diagnosis not present

## 2020-01-26 DIAGNOSIS — Z20822 Contact with and (suspected) exposure to covid-19: Secondary | ICD-10-CM | POA: Insufficient documentation

## 2020-01-26 DIAGNOSIS — E1140 Type 2 diabetes mellitus with diabetic neuropathy, unspecified: Secondary | ICD-10-CM | POA: Diagnosis not present

## 2020-01-26 DIAGNOSIS — E1129 Type 2 diabetes mellitus with other diabetic kidney complication: Secondary | ICD-10-CM | POA: Diagnosis not present

## 2020-01-26 DIAGNOSIS — N2581 Secondary hyperparathyroidism of renal origin: Secondary | ICD-10-CM | POA: Diagnosis not present

## 2020-01-26 DIAGNOSIS — D649 Anemia, unspecified: Secondary | ICD-10-CM | POA: Diagnosis not present

## 2020-01-26 DIAGNOSIS — Z7982 Long term (current) use of aspirin: Secondary | ICD-10-CM | POA: Diagnosis not present

## 2020-01-26 DIAGNOSIS — D631 Anemia in chronic kidney disease: Secondary | ICD-10-CM | POA: Diagnosis not present

## 2020-01-26 LAB — COMPREHENSIVE METABOLIC PANEL
ALT: 23 U/L (ref 0–44)
AST: 14 U/L — ABNORMAL LOW (ref 15–41)
Albumin: 3.5 g/dL (ref 3.5–5.0)
Alkaline Phosphatase: 75 U/L (ref 38–126)
Anion gap: 13 (ref 5–15)
BUN: 10 mg/dL (ref 8–23)
CO2: 29 mmol/L (ref 22–32)
Calcium: 8.1 mg/dL — ABNORMAL LOW (ref 8.9–10.3)
Chloride: 94 mmol/L — ABNORMAL LOW (ref 98–111)
Creatinine, Ser: 2.32 mg/dL — ABNORMAL HIGH (ref 0.44–1.00)
GFR calc Af Amer: 25 mL/min — ABNORMAL LOW (ref >60)
GFR calc non Af Amer: 21 mL/min — ABNORMAL LOW (ref >60)
Glucose, Bld: 112 mg/dL — ABNORMAL HIGH (ref 70–99)
Potassium: 3.3 mmol/L — ABNORMAL LOW (ref 3.5–5.1)
Sodium: 136 mmol/L (ref 135–145)
Total Bilirubin: 0.3 mg/dL (ref 0.3–1.2)
Total Protein: 6.8 g/dL (ref 6.5–8.1)

## 2020-01-26 LAB — CBC
HCT: 25.7 % — ABNORMAL LOW (ref 36.0–46.0)
Hemoglobin: 7.7 g/dL — ABNORMAL LOW (ref 12.0–15.0)
MCH: 30.4 pg (ref 26.0–34.0)
MCHC: 30 g/dL (ref 30.0–36.0)
MCV: 101.6 fL — ABNORMAL HIGH (ref 80.0–100.0)
Platelets: 221 10*3/uL (ref 150–400)
RBC: 2.53 MIL/uL — ABNORMAL LOW (ref 3.87–5.11)
RDW: 18.8 % — ABNORMAL HIGH (ref 11.5–15.5)
WBC: 4.6 10*3/uL (ref 4.0–10.5)
nRBC: 0 % (ref 0.0–0.2)

## 2020-01-26 NOTE — ED Triage Notes (Addendum)
Pt [facility] ED from [facility] d/t anemia. Pt endorses lethargy. Pt is tues, thurs, sat dialysis pt. Pt received complete treatment today. Pt states she had recent stool occult done and result was positive.

## 2020-01-27 ENCOUNTER — Observation Stay (HOSPITAL_COMMUNITY): Payer: Medicare Other

## 2020-01-27 ENCOUNTER — Other Ambulatory Visit: Payer: Self-pay

## 2020-01-27 ENCOUNTER — Encounter (HOSPITAL_COMMUNITY): Payer: Self-pay | Admitting: Internal Medicine

## 2020-01-27 DIAGNOSIS — M4856XA Collapsed vertebra, not elsewhere classified, lumbar region, initial encounter for fracture: Secondary | ICD-10-CM | POA: Diagnosis not present

## 2020-01-27 DIAGNOSIS — I700 Atherosclerosis of aorta: Secondary | ICD-10-CM | POA: Diagnosis not present

## 2020-01-27 DIAGNOSIS — K5730 Diverticulosis of large intestine without perforation or abscess without bleeding: Secondary | ICD-10-CM | POA: Diagnosis not present

## 2020-01-27 DIAGNOSIS — D649 Anemia, unspecified: Secondary | ICD-10-CM

## 2020-01-27 DIAGNOSIS — D259 Leiomyoma of uterus, unspecified: Secondary | ICD-10-CM | POA: Diagnosis not present

## 2020-01-27 HISTORY — DX: Anemia, unspecified: D64.9

## 2020-01-27 LAB — CBC
HCT: 25.7 % — ABNORMAL LOW (ref 36.0–46.0)
Hemoglobin: 7.4 g/dL — ABNORMAL LOW (ref 12.0–15.0)
MCH: 29.2 pg (ref 26.0–34.0)
MCHC: 28.8 g/dL — ABNORMAL LOW (ref 30.0–36.0)
MCV: 101.6 fL — ABNORMAL HIGH (ref 80.0–100.0)
Platelets: 198 10*3/uL (ref 150–400)
RBC: 2.53 MIL/uL — ABNORMAL LOW (ref 3.87–5.11)
RDW: 18.9 % — ABNORMAL HIGH (ref 11.5–15.5)
WBC: 3.9 10*3/uL — ABNORMAL LOW (ref 4.0–10.5)
nRBC: 0 % (ref 0.0–0.2)

## 2020-01-27 LAB — RETICULOCYTES
Immature Retic Fract: 33.2 % — ABNORMAL HIGH (ref 2.3–15.9)
RBC.: 2.51 MIL/uL — ABNORMAL LOW (ref 3.87–5.11)
Retic Count, Absolute: 133.3 10*3/uL (ref 19.0–186.0)
Retic Ct Pct: 5.3 % — ABNORMAL HIGH (ref 0.4–3.1)

## 2020-01-27 LAB — GLUCOSE, CAPILLARY: Glucose-Capillary: 169 mg/dL — ABNORMAL HIGH (ref 70–99)

## 2020-01-27 LAB — PREPARE RBC (CROSSMATCH)

## 2020-01-27 LAB — POC OCCULT BLOOD, ED: Fecal Occult Bld: POSITIVE — AB

## 2020-01-27 LAB — SARS CORONAVIRUS 2 BY RT PCR (HOSPITAL ORDER, PERFORMED IN ~~LOC~~ HOSPITAL LAB): SARS Coronavirus 2: NEGATIVE

## 2020-01-27 MED ORDER — ACETAMINOPHEN 325 MG PO TABS: 650.0000 mg | ORAL_TABLET | Freq: Four times a day (QID) | ORAL | Status: DC | PRN

## 2020-01-27 MED ORDER — SERTRALINE HCL 100 MG PO TABS
100.0000 mg | ORAL_TABLET | Freq: Every day | ORAL | Status: DC
  Administered 2020-01-28: 100 mg via ORAL
  Filled 2020-01-27: qty 1

## 2020-01-27 MED ORDER — GABAPENTIN 300 MG PO CAPS
300.0000 mg | ORAL_CAPSULE | Freq: Every day | ORAL | Status: DC
  Administered 2020-01-27: 300 mg via ORAL
  Filled 2020-01-27: qty 1

## 2020-01-27 MED ORDER — CHLORPHENIRAMINE MALEATE 4 MG PO TABS
4.0000 mg | ORAL_TABLET | Freq: Every day | ORAL | Status: DC | PRN
  Filled 2020-01-27: qty 1

## 2020-01-27 MED ORDER — SODIUM CHLORIDE 0.9% IV SOLUTION: Freq: Once | INTRAVENOUS | Status: AC

## 2020-01-27 MED ORDER — CLONIDINE HCL 0.2 MG PO TABS
0.2000 mg | ORAL_TABLET | Freq: Two times a day (BID) | ORAL | Status: DC
  Administered 2020-01-27 – 2020-01-28 (×2): 0.2 mg via ORAL
  Filled 2020-01-27 (×3): qty 1

## 2020-01-27 MED ORDER — GABAPENTIN 100 MG PO CAPS
200.0000 mg | ORAL_CAPSULE | Freq: Three times a day (TID) | ORAL | Status: DC
  Administered 2020-01-27 – 2020-01-28 (×2): 200 mg via ORAL
  Filled 2020-01-27 (×2): qty 2

## 2020-01-27 MED ORDER — CALCIUM ACETATE (PHOS BINDER) 667 MG PO CAPS
1334.0000 mg | ORAL_CAPSULE | Freq: Three times a day (TID) | ORAL | Status: DC
  Administered 2020-01-27 – 2020-01-28 (×2): 1334 mg via ORAL
  Filled 2020-01-27 (×2): qty 2

## 2020-01-27 MED ORDER — FERROUS SULFATE 325 (65 FE) MG PO TABS
325.0000 mg | ORAL_TABLET | Freq: Every day | ORAL | Status: DC
  Administered 2020-01-28: 325 mg via ORAL
  Filled 2020-01-27: qty 1

## 2020-01-27 MED ORDER — HYDRALAZINE HCL 25 MG PO TABS
100.0000 mg | ORAL_TABLET | ORAL | Status: DC
  Administered 2020-01-27: 100 mg via ORAL
  Filled 2020-01-27: qty 4

## 2020-01-27 MED ORDER — POLYETHYLENE GLYCOL 3350 17 G PO PACK: 17.0000 g | PACK | Freq: Every day | ORAL | Status: DC | PRN

## 2020-01-27 MED ORDER — PANTOPRAZOLE SODIUM 40 MG PO TBEC
40.0000 mg | DELAYED_RELEASE_TABLET | Freq: Every day | ORAL | Status: DC
  Administered 2020-01-27 – 2020-01-28 (×2): 40 mg via ORAL
  Filled 2020-01-27 (×2): qty 1

## 2020-01-27 MED ORDER — SEVELAMER CARBONATE 800 MG PO TABS
1600.0000 mg | ORAL_TABLET | Freq: Three times a day (TID) | ORAL | Status: DC
  Administered 2020-01-28: 1600 mg via ORAL
  Filled 2020-01-27: qty 2

## 2020-01-27 MED ORDER — METHOCARBAMOL 500 MG PO TABS: 500.0000 mg | ORAL_TABLET | Freq: Three times a day (TID) | ORAL | Status: DC | PRN

## 2020-01-27 MED ORDER — LIDOCAINE-PRILOCAINE 2.5-2.5 % EX CREA: 1.0000 "application " | TOPICAL_CREAM | Freq: Once | CUTANEOUS | Status: DC

## 2020-01-27 MED ORDER — ATORVASTATIN CALCIUM 10 MG PO TABS
10.0000 mg | ORAL_TABLET | Freq: Every day | ORAL | Status: DC
  Administered 2020-01-28: 10 mg via ORAL
  Filled 2020-01-27: qty 1

## 2020-01-27 MED ORDER — ASPIRIN EC 81 MG PO TBEC
81.0000 mg | DELAYED_RELEASE_TABLET | Freq: Every day | ORAL | Status: DC
  Administered 2020-01-27 – 2020-01-28 (×2): 81 mg via ORAL
  Filled 2020-01-27 (×2): qty 1

## 2020-01-27 MED ORDER — RENA-VITE PO TABS
1.0000 | ORAL_TABLET | Freq: Every day | ORAL | Status: DC
  Administered 2020-01-28: 1 via ORAL
  Filled 2020-01-27: qty 1

## 2020-01-27 MED ORDER — ACETAMINOPHEN 650 MG RE SUPP: 650.0000 mg | Freq: Four times a day (QID) | RECTAL | Status: DC | PRN

## 2020-01-27 NOTE — ED Notes (Signed)
repoirt given to [employer]

## 2020-01-27 NOTE — Progress Notes (Signed)
Spoke with blood bank and was notified the blood had been ready and was called at 1255 today (01/27/20). IV was placed at 1511 today (01/27/20). I received the pt at 1525 today (01/27/20). Pt is currently stable. No signs of distress noted. Pt denies dizziness. Pt is alert and oriented. Will immediately address incomplete blood transfusion.

## 2020-01-27 NOTE — ED Notes (Signed)
Unsuccessful IV attempt.

## 2020-01-27 NOTE — ED Notes (Signed)
IV team obtained IV access. Blood administration to follow.

## 2020-01-27 NOTE — ED Provider Notes (Signed)
Medical screening examination/treatment/procedure(s) were conducted as a shared visit with non-physician practitioner(s) and myself.  I personally evaluated the patient during the encounter.    64 year old female here pain of weakness.  Was found to have worsening anemia.  Stools were guaiac positive.  GI will be consulted.  Will be admitted to the hospital for blood transfusion management   Lacretia Leigh, MD 01/27/20 1226

## 2020-01-27 NOTE — Progress Notes (Addendum)
Pt is in [facility] ED w lethargy, Hgb 7.4 and FOBT +.  Hgb yest 7.7; 6  >> 2 PRBCs >> 8.6 in early may.   Sent from Dialysis to ED.   Anemia is recurrent issue and pt had suboptimal colonoscopy due to poor prep in 06/27/19 and again 07/27/19 Has appt for colonoscopy on 8/25 w Dr Silverio Decamp  GI coverage for weekend is Dr Rush Landmark.  Plan at present, d/w Dr Silverio Decamp, is transfuse as attending MD feels necessary and continue w plan for outpt colonoscopy.  Azucena Freed PA-C

## 2020-01-27 NOTE — ED Provider Notes (Signed)
[facility] EMERGENCY DEPARTMENT Provider Note   CSN: [id number] Arrival date & time: 01/26/20  1458     History Chief Complaint  Patient presents with  . Anemia    Brandy Houston is a 64 y.o. female.  Patient is a 64 year old female with past medical history of ESRD on dialysis, chronic anemia, CHF, GI bleeds presenting to the emergency department for fatigue and "need of transfusion". Patient reports that last week she had blood drawn and her hemoglobin was around six-point something. Reports that she also had a Hemoccult card done at her primary care doctor's office and it was positive. Patient has had history of the same in the past, most recently in May where a colonoscopy was nondiagnostic due to an insufficient bowel prep. She did receive blood transfusions then. Patient does report that she has some dark black stools but is unsure if this is from her iron supplementation or not. She did complete her dialysis treatment yesterday prior to coming to the hospital. She denies any hematemesis. She does have follow-up with GI this month and is due to have another colonoscopy performed.        Past Medical History:  Diagnosis Date  . Allergy   . Anemia   . Anxiety   . Arthritis    "in my joints" (03/10/2017)  . Chronic diastolic CHF (congestive heart failure) ([place])   . CKD (chronic kidney disease), stage IV ([place])    stage IV. previous HD, none currently 10/30/15 (confirmed 02/05/2017 & 03/10/2017)  . Depression    Chronic  . Diabetic peripheral neuropathy ([place])   . Dysrhythmia    tachycardia, normal ECHO 08-09-14  . GERD (gastroesophageal reflux disease)   . Heart murmur     dx'd 02/05/2017  . Hepatitis C    "tx'd in 2016; I'm negative now" (02/05/2017)  . History of blood transfusion 2017   "w/knee replacement"  . Hyperlipidemia   . Hypertension   . Osteoarthritis of left knee   . Peripheral neuropathy   . Protein calorie malnutrition ([place])   . Septic  arthritis ([place])   . Slow transit constipation   . Type II diabetes mellitus ([name])    IDDM  . Uncontrolled hypertension 02/18/2013  . Unsteady gait     Patient Active Problem List   Diagnosis Date Noted  . Symptomatic anemia 01/27/2020  . Hypercalcemia 10/06/2019  . QT prolongation 06/05/2019  . Moderate protein-calorie malnutrition ([place]) 05/11/2018  . End stage renal disease ([place]) 04/19/2018  . Iron deficiency anemia, unspecified 04/19/2018  . Nephrotic syndrome with unspecified morphologic changes 04/19/2018  . Secondary hyperparathyroidism of renal origin ([place]) 04/19/2018  . Tobacco use 04/19/2018  . Benign essential HTN   . Above knee amputation status, left   . Anemia, blood loss   . DM type 2 causing ESRD ([place]) 03/21/2017  . Anemia in chronic kidney disease 02/05/2017  . Chronic diastolic CHF (congestive heart failure) ([place]) 11/08/2015  . Diabetic peripheral neuropathy ([place])   . Major depression, recurrent, chronic ([place])   . Chronic kidney disease (CKD), stage IV (severe) ([place])   . Hepatitis C 08/16/2014  . Peripheral neuropathy 06/24/2013    Past Surgical History:  Procedure Laterality Date  . A/V FISTULAGRAM Right 07/07/2018   Procedure: A/V FISTULAGRAM;  Surgeon: Marty Heck, MD;  Location: [facility];  Service: Cardiovascular;  Laterality: Right;  . AMPUTATION Left 03/20/2017   Procedure: LEFT ABOVE KNEE AMPUTATION;  Surgeon: Newt Minion,  MD;  Location: [facility];  Service: Orthopedics;  Laterality: Left;  . APPENDECTOMY    . AV FISTULA PLACEMENT Left 09/13/2014   Procedure: Brachial Artery to Brachial Vein Gortex Four - Seven Stretch GRAFT INSERTION Left Forearm;  Surgeon: Mal Misty, MD;  Location: [facility];  Service: Vascular;  Laterality: Left;  . BASCILIC VEIN TRANSPOSITION Right 10/26/2017   Procedure: BASILIC VEIN TRANSPOSITION FIRST STAGE RIGHT ARM;  Surgeon: Conrad [place]ville, MD;  Location: [facility];  Service: Vascular;  Laterality: Right;  . BASCILIC  VEIN TRANSPOSITION Right 02/01/2018   Procedure: SECOND STAGE BASILIC VEIN TRANSPOSITION RIGHT UPPER EXTREMITY;  Surgeon: Marty Heck, MD;  Location: [facility];  Service: Vascular;  Laterality: Right;  . CHOLECYSTECTOMY OPEN    . COLON SURGERY    . COLONOSCOPY    . EXCISIONAL TOTAL KNEE ARTHROPLASTY WITH ANTIBIOTIC SPACERS Left 02/06/2017   Procedure: Incisional total iknee with antibiotic spacer ;  Surgeon: Leandrew Koyanagi, MD;  Location: [facility];  Service: Orthopedics;  Laterality: Left;  . IR FLUORO GUIDE CV LINE RIGHT  02/10/2017  . IR REMOVAL TUN CV CATH W/O FL  04/01/2017  . IR US GUIDE VASC ACCESS RIGHT  02/10/2017  . IRRIGATION AND DEBRIDEMENT KNEE Left 03/12/2017   Procedure: IRRIGATION AND DEBRIDEMENT LEFT KNEE WITH WOUND VAC APPLICATION;  Surgeon: Leandrew Koyanagi, MD;  Location: [facility];  Service: Orthopedics;  Laterality: Left;  . JOINT REPLACEMENT    . KNEE ARTHROSCOPY Right 08/10/2014   Procedure: ARTHROSCOPY I & D KNEE;  Surgeon: Marianna Payment, MD;  Location: [facility];  Service: Orthopedics;  Laterality: Right;  . KNEE ARTHROSCOPY Left 08/11/2014   Procedure: ARTHROSCOPIC WASHOUT LEFT KNEE;  Surgeon: Marianna Payment, MD;  Location: [facility];  Service: Orthopedics;  Laterality: Left;  . KNEE ARTHROSCOPY Left 08/19/2014   Procedure: ARTHROSCOPIC WASHOUT LEFT KNEE;  Surgeon: Leandrew Koyanagi, MD;  Location: [facility];  Service: Orthopedics;  Laterality: Left;  . KNEE ARTHROSCOPY WITH LATERAL MENISECTOMY Left 04/04/2015   Procedure: AND PARTIAL LATERAL MENISECTOMY;  Surgeon: Leandrew Koyanagi, MD;  Location: [facility];  Service: Orthopedics;  Laterality: Left;  . KNEE ARTHROSCOPY WITH MEDIAL MENISECTOMY Left 04/04/2015   Procedure: LEFT KNEE ARTHROSCOPY WITH PARTIAL MEDIAL MENISCECTOMY  AND SYNOVECTOMY;  Surgeon: Leandrew Koyanagi, MD;  Location: [facility];  Service: Orthopedics;  Laterality: Left;  . PERIPHERAL VASCULAR BALLOON ANGIOPLASTY  07/07/2018   Procedure: PERIPHERAL  VASCULAR BALLOON ANGIOPLASTY;  Surgeon: Marty Heck, MD;  Location: [facility];  Service: Cardiovascular;;  right AV fistula  . POLYPECTOMY    . SHOULDER ARTHROSCOPY Bilateral 08/10/2014   Procedure: I & D BILATERAL SHOULDERS ;  Surgeon: Marianna Payment, MD;  Location: [facility];  Service: Orthopedics;  Laterality: Bilateral;  . SMALL INTESTINE SURGERY     Due to Small Bowel Obstruction; "fixed it when they did my gallbladder OR"  . TEE WITHOUT CARDIOVERSION N/A 08/14/2014   Procedure: TRANSESOPHAGEAL ECHOCARDIOGRAM (TEE);  Surgeon: Thayer Headings, MD;  Location: [facility];  Service: Cardiovascular;  Laterality: N/A;  . TENOSYNOVECTOMY Right 08/11/2014   Procedure: RIGHT WRIST IRRIGATION AND DEBRIDEMENT, TENOSYNOVECTOMY;  Surgeon: Marianna Payment, MD;  Location: [facility];  Service: Orthopedics;  Laterality: Right;  . TOTAL KNEE ARTHROPLASTY Left 11/07/2015   Procedure: LEFT TOTAL KNEE ARTHROPLASTY WITH REVISION OF IMPLANTS;  Surgeon: Leandrew Koyanagi, MD;  Location: [facility];  Service: Orthopedics;  Laterality: Left;  .  TUBAL LIGATION       OB History    Gravida  5   Para  0   Term  0   Preterm      AB  2   Living  3     SAB  1   TAB  1   Ectopic      Multiple      Live Births              Family History  Problem Relation Age of Onset  . Uterine cancer Mother   . Heart disease Father   . Hypertension Sister   . Uterine cancer Sister   . Cancer Brother        unsure type  . Diabetes Brother   . Hypertension Brother   . Colon cancer Neg Hx   . Colon polyps Neg Hx   . Esophageal cancer Neg Hx   . Rectal cancer Neg Hx   . Stomach cancer Neg Hx     Social History   Tobacco Use  . Smoking status: Current Every Day Smoker    Packs/day: 0.50    Years: 35.00    Pack years: 17.50    Types: Cigarettes  . Smokeless tobacco: Never Used  Vaping Use  . Vaping Use: Never used  Substance Use Topics  . Alcohol use: Not Currently    Alcohol/week: 0.0  standard drinks    Comment: 03/10/2017 "I drink a wine cooler a few times/year"  . Drug use: No    Home Medications Prior to Admission medications   Medication Sig Start Date End Date Taking? Authorizing Provider  aspirin EC 81 MG tablet Take 81 mg by mouth daily.   Yes [provider]  atorvastatin (LIPITOR) 10 MG tablet TAKE 1 TABLET BY MOUTH AT BEDTIME . APPOINTMENT REQUIRED FOR FUTURE REFILLS Patient taking differently: Take 10 mg by mouth at bedtime.  10/31/19  Yes [name], MD  Chlorpheniramine Maleate (ALLERGY PO) Take 1 tablet by mouth daily as needed (allergies).   Yes [provider]  lidocaine-prilocaine (EMLA) cream Apply 1 application topically once. On days of dialysis 3x week 10/25/18  Yes [provider]  Methoxy PEG-Epoetin Beta (MIRCERA IJ) Get it at dialysis 06/30/19 06/28/20 Yes [provider]  sevelamer carbonate (RENVELA) 800 MG tablet Take 1,600 mg by mouth 3 (three) times daily with meals.   Yes [provider]  Accu-Chek Softclix Lancets lancets Check sugars three times a day for E11.65 09/20/18   [name], MD  amLODipine (NORVASC) 10 MG tablet Take 1 tablet (10 mg total) by mouth at bedtime. Patient not taking: Reported on 01/27/2020 03/31/17   Angiulli, Lavon Paganini, PA-C  blood glucose meter kit and supplies KIT Dispense based on patient and insurance preference. Use up to four times daily as directed 10/13/19   [name], MD  Blood Glucose Monitoring Suppl (ACCU-CHEK GUIDE) w/Device KIT 1 Device by Does not apply route daily. 10/31/19   [name], MD  calcium acetate (PHOSLO) 667 MG capsule Take 2 capsules by mouth 3 (three) times daily. 01/13/19   [provider]  cloNIDine (CATAPRES) 0.2 MG tablet Take 0.2 mg by mouth 2 (two) times daily.    [provider]  Ferrous Sulfate (IRON PO) Take 325 mg by mouth daily.     [provider]  gabapentin (NEURONTIN) 100 MG capsule Take 200 mg  by mouth 3 (three) times daily. 11/01/18  [provider]  gabapentin (NEURONTIN) 300 MG capsule Take 300 mg by mouth at bedtime.  01/13/20   [provider]  glucose blood (ACCU-CHEK GUIDE) test strip Check blood sugar up to 4 times a day 10/20/19   [name], MD  hydrALAZINE (APRESOLINE) 100 MG tablet Take 100 mg by mouth as directed. Take only on non dialysis day    [provider]  Lancet Devices [facility]) lancets Check sugars TID for E11.65 01/29/15   Chari Manning A, NP  methocarbamol (ROBAXIN) 500 MG tablet Take 500 mg by mouth every 8 (eight) hours as needed for muscle spasms.  06/08/19   [provider]  multivitamin (RENA-VIT) TABS tablet Take 1 tablet by mouth daily. 03/01/19   [provider]  omeprazole (PRILOSEC) 20 MG capsule TAKE 1 CAPSULE BY MOUTH ONCE DAILY BEFORE BREAKFAST . APPOINTMENT REQUIRED FOR FUTURE REFILLS Patient taking differently: Take 20 mg by mouth daily.  11/04/19   [name], MD  polyethylene glycol (MIRALAX / GLYCOLAX) 17 g packet Take 17 g by mouth daily as needed for moderate constipation. Patient not taking: Reported on 01/27/2020 12/10/18   [name], MD  sertraline (ZOLOFT) 100 MG tablet Take 1 tablet by mouth once daily 01/23/20   [name], MD  traZODone (DESYREL) 50 MG tablet Take 50 mg by mouth at bedtime. Patient not taking: Reported on 01/27/2020 10/27/18   [provider]    Allergies    Compazine [prochlorperazine], Shellfish-derived products, Iodinated diagnostic agents, Omnipaque [iohexol], Tape, and Sulfa antibiotics  Review of Systems   Review of Systems  Constitutional: Positive for fatigue. Negative for appetite change, chills and fever.  HENT: Negative.   Respiratory: Negative for cough and shortness of breath.   Cardiovascular: Negative.   Gastrointestinal: Negative.   Genitourinary: Negative.   Musculoskeletal: Negative for back pain.  Skin: Negative for  rash and wound.  Hematological: Does not bruise/bleed easily.    Physical Exam Updated Vital Signs BP (!) 149/70   Pulse 87   Temp 98.9 F (37.2 C) (Oral)   Resp 19   Ht '5\' 7"'  (1.702 m)   Wt 60.8 kg   LMP 03/16/2002   SpO2 99%   BMI 20.99 kg/m   Physical Exam Vitals and nursing note reviewed. Exam conducted with a chaperone present.  Constitutional:      General: She is not in acute distress.    Appearance: Normal appearance. She is not ill-appearing, toxic-appearing or diaphoretic.  HENT:     Head: Normocephalic.     Mouth/Throat:     Mouth: Mucous membranes are moist.  Eyes:     Conjunctiva/sclera: Conjunctivae normal.  Cardiovascular:     Rate and Rhythm: Normal rate and regular rhythm.  Pulmonary:     Effort: Pulmonary effort is normal.  Abdominal:     General: Abdomen is flat.     Palpations: Abdomen is soft.  Genitourinary:    Rectum: Normal.     Comments: Stool is brown Skin:    General: Skin is warm and dry.  Neurological:     Mental Status: She is alert.  Psychiatric:        Mood and Affect: Mood normal.     ED Results / Procedures / Treatments   Labs (all labs ordered are listed, but only abnormal results are displayed) Labs Reviewed  COMPREHENSIVE METABOLIC PANEL - Abnormal; Notable for the following components:      Result Value  Potassium 3.3 (*)    Chloride 94 (*)    Glucose, Bld 112 (*)    Creatinine, Ser 2.32 (*)    Calcium 8.1 (*)    AST 14 (*)    GFR calc non Af Amer 21 (*)    GFR calc Af Amer 25 (*)    All other components within normal limits  CBC - Abnormal; Notable for the following components:   RBC 2.53 (*)    Hemoglobin 7.7 (*)    HCT 25.7 (*)    MCV 101.6 (*)    RDW 18.8 (*)    All other components within normal limits  CBC - Abnormal; Notable for the following components:   WBC 3.9 (*)    RBC 2.53 (*)    Hemoglobin 7.4 (*)    HCT 25.7 (*)    MCV 101.6 (*)    MCHC 28.8 (*)    RDW 18.9 (*)    All other components  within normal limits  RETICULOCYTES - Abnormal; Notable for the following components:   Retic Ct Pct 5.3 (*)    RBC. 2.51 (*)    Immature Retic Fract 33.2 (*)    All other components within normal limits  POC OCCULT BLOOD, ED - Abnormal; Notable for the following components:   Fecal Occult Bld POSITIVE (*)    All other components within normal limits  SARS CORONAVIRUS 2 BY RT PCR (HOSPITAL ORDER, [facility])  IRON AND TIBC  FERRITIN  HEMOGLOBIN AND HEMATOCRIT, BLOOD  VITAMIN B12  POC OCCULT BLOOD, ED  TYPE AND SCREEN  PREPARE RBC (CROSSMATCH)    EKG None  Radiology CT ABDOMEN PELVIS WO CONTRAST  Result Date: 01/27/2020 CLINICAL DATA:  Left lower quadrant abdominal pain. EXAM: CT ABDOMEN AND PELVIS WITHOUT CONTRAST TECHNIQUE: Multidetector CT imaging of the abdomen and pelvis was performed following the standard protocol without IV contrast. COMPARISON:  03/28/2017 FINDINGS: Lower chest: Heart mildly enlarged. Small pericardial effusion. Minor lung base linear atelectasis. Hepatobiliary: No focal liver abnormality is seen. Status post cholecystectomy. No biliary dilatation. Pancreas: Unremarkable. No pancreatic ductal dilatation or surrounding inflammatory changes. Spleen: Normal in size without focal abnormality. Adrenals/Urinary Tract: Thickened left adrenal gland without a defined mass. Normal right adrenal gland. Kidneys normal in position. No visualized renal mass, no stone and no hydronephrosis. Ureters normal course and in caliber. No ureteral stones. Stomach/Bowel: Numerous colonic diverticula most prominent along the sigmoid. No evidence of diverticulitis, colonic wall thickening or other inflammatory process. There are mildly dilated central abdominal small bowel loops, without wall thickening. This is likely due to normal peristalsis. There is no evidence of obstruction. Remainder of the small bowel is unremarkable. There are surgical vascular clips in  the right lower quadrant. Vascular/Lymphatic: Aortic and branch vessel atherosclerosis. No aneurysm. No enlarged lymph nodes. Reproductive: Uterus mildly enlarged and lobulated consistent with multiple fibroids, similar to the prior CT. No adnexal masses. Bilateral tubal ligation clips. Other: Trace amount of ascites anterior to the liver. No abdominal wall hernia. Musculoskeletal: Mild compression fractures of L1 and L3. Mild generalized loss height of the L5 vertebra. These findings are stable from the prior CT. No acute fracture. No osteoblastic or osteolytic lesions. IMPRESSION: 1. No acute findings. There are colonic diverticula, but there is no evidence of diverticulitis or other bowel inflammatory process. 2. Mild cardiomegaly and small pericardial effusion similar to the prior CT. 3. Aortic and branch vessel atherosclerosis. 4. Uterine fibroids. 5. Old L1 and L3  vertebral compression fractures. Electronically Signed   By: Lajean Manes M.D.   On: 01/27/2020 16:10    Procedures Procedures (including critical care time)  Medications Ordered in ED Medications  0.9 %  sodium chloride infusion (Manually program via Guardrails IV Fluids) (has no administration in time range)  aspirin EC tablet 81 mg (has no administration in time range)  atorvastatin (LIPITOR) tablet 10 mg (has no administration in time range)  cloNIDine (CATAPRES) tablet 0.2 mg (has no administration in time range)  hydrALAZINE (APRESOLINE) tablet 100 mg (has no administration in time range)  sertraline (ZOLOFT) tablet 100 mg (has no administration in time range)  calcium acetate (PHOSLO) capsule 1,334 mg (has no administration in time range)  pantoprazole (PROTONIX) EC tablet 40 mg (has no administration in time range)  polyethylene glycol (MIRALAX / GLYCOLAX) packet 17 g (has no administration in time range)  sevelamer carbonate (RENVELA) tablet 1,600 mg (has no administration in time range)  ferrous sulfate tablet 325 mg (has  no administration in time range)  gabapentin (NEURONTIN) capsule 200 mg (has no administration in time range)  gabapentin (NEURONTIN) capsule 300 mg (has no administration in time range)  methocarbamol (ROBAXIN) tablet 500 mg (has no administration in time range)  multivitamin (RENA-VIT) tablet 1 tablet (has no administration in time range)  chlorpheniramine (CHLOR-TRIMETON) tablet 4 mg (has no administration in time range)  acetaminophen (TYLENOL) tablet 650 mg (has no administration in time range)    Or  acetaminophen (TYLENOL) suppository 650 mg (has no administration in time range)    ED Course  I have reviewed the triage vital signs and the nursing notes.  Pertinent labs & imaging results that were available during my care of the patient were reviewed by me and considered in my medical decision making (see chart for details).  Clinical Course as of Jan 27 1615  Fri Jan 27, 2020  1000 Patient with history of CKD, end-stage renal disease and chronic anemia presenting to the emergency department for fatigue and requesting blood transfusion. Her hemoglobin initially was 7.7. She has been in the waiting room for about 9. Repeat CBC performed at time of evaluation by myself. Hemoccult card obtained.   [KM]  1230 Hemoccult was positive and her hemoglobin is downtrending.  I discussed this with her GI midlevel on-call who relayed to Dr. Rush Landmark.  They report that they will keep her appointment for the 25th to have the colonoscopy done and will not do any in-hospital procedure at this time.  Will still admit the patient for symptomatic anemia   [KM]    Clinical Course User Index [KM] Kristine Royal   MDM Rules/Calculators/A&P                          CRITICAL CARE Performed by: Alveria Apley   Total critical care time: 35 minutes  Critical care time was exclusive of separately billable procedures and treating other patients.  Critical care was necessary to treat or prevent  imminent or life-threatening deterioration.  Critical care was time spent personally by me on the following activities: development of treatment plan with patient and/or surrogate as well as nursing, discussions with consultants, evaluation of patient's response to treatment, examination of patient, obtaining history from patient or surrogate, ordering and performing treatments and interventions, ordering and review of laboratory studies, ordering and review of radiographic studies, pulse oximetry and re-evaluation of patient's condition.  Final Clinical Impression(s) /  ED Diagnoses Final diagnoses:  Symptomatic anemia  Gastrointestinal hemorrhage, unspecified gastrointestinal hemorrhage type    Rx / DC Orders ED Discharge Orders    None       Kristine Royal 01/27/20 1616    Lacretia Leigh, MD 01/28/20 1420

## 2020-01-27 NOTE — ED Notes (Signed)
Pt tolerated activity well. Pt denied felling lightheaded, dizzy, off balance or any changes to vision. Pt reported feeling a headache shortly after standing. Findings reported to EDP.

## 2020-01-27 NOTE — ED Notes (Signed)
Pt went somewhere

## 2020-01-27 NOTE — H&P (Signed)
History and Physical    Brandy Houston DOB: 1956/06/07 DOA: 01/26/2020  PCP: [name], MD (Confirm with patient/family/NH records and if not entered, this has to be entered at [facility] point of entry) Patient coming from: Home  I have personally briefly reviewed patient's old medical records in [place]  Chief Complaint: Feeling tired  HPI: Brandy Houston is a 64 y.o. female with medical history significant of chronic anemia secondary to chronic kidney disease and iron deficiency, ESRD in HD via right arm A-V graft (since April 2021), HTN, dCHF, IDDM, left AKA, presented with worsening of anemia.  Patient has been feeling tired lately and went to PCP to have a blood work done which showed her hemoglobin level decreased from 8.63 months ago to below 7 and sent to the ED. patient was admitted to hospital for worsening of anemia 2 times this year, had GI try to do colonoscopy on both occasions but with suboptimal results.  And patient was scheduled to have another colonoscopy August 25.  Patient also reported she has been having on and off left lower quadrant abdominal dull pain for the last 22-month  She reported her stool has been more with dark-colored since she started to take iron pills, and denied any blood in the stools.  No nauseous vomit, no night sweats, no weight loss ED Course: Hemoglobin 7.7>7.3  Review of Systems: As per HPI otherwise 14 point review of systems negative.    Past Medical History:  Diagnosis Date  . Allergy   . Anemia   . Anxiety   . Arthritis    "in my joints" (03/10/2017)  . Chronic diastolic CHF (congestive heart failure) ([place]   . CKD (chronic kidney disease), stage IV ([place]    stage IV. previous HD, none currently 10/30/15 (confirmed 02/05/2017 & 03/10/2017)  . Depression    Chronic  . Diabetic peripheral neuropathy ([place]   . Dysrhythmia    tachycardia, normal ECHO 08-09-14  . GERD (gastroesophageal reflux disease)   . Heart murmur       dx'd 02/05/2017  . Hepatitis C    "tx'd in 2016; I'm negative now" (02/05/2017)  . History of blood transfusion 2017   "w/knee replacement"  . Hyperlipidemia   . Hypertension   . Osteoarthritis of left knee   . Peripheral neuropathy   . Protein calorie malnutrition ([place]   . Septic arthritis ([place]   . Slow transit constipation   . Type II diabetes mellitus ([name])    IDDM  . Uncontrolled hypertension 02/18/2013  . Unsteady gait     Past Surgical History:  Procedure Laterality Date  . A/V FISTULAGRAM Right 07/07/2018   Procedure: A/V FISTULAGRAM;  Surgeon: CMarty Heck MD;  [facility];  Service: Cardiovascular;  Laterality: Right;  . AMPUTATION Left 03/20/2017   Procedure: LEFT ABOVE KNEE AMPUTATION;  Surgeon: DNewt Minion MD;  [facility] [facility]  Service: Orthopedics;  Laterality: Left;  . APPENDECTOMY    . AV FISTULA PLACEMENT Left 09/13/2014   Procedure: Brachial Artery to Brachial Vein Gortex Four - Seven Stretch GRAFT INSERTION Left Forearm;  Surgeon: JMal Misty MD;  [facility] [facility]  Service: Vascular;  Laterality: Left;  . BASCILIC VEIN TRANSPOSITION Right 10/26/2017   Procedure: BASILIC VEIN TRANSPOSITION FIRST STAGE RIGHT ARM;  Surgeon: CConrad Bellevue MD;  [facility]  Service: Vascular;  Laterality: Right;  . BASCILIC VEIN TRANSPOSITION Right 02/01/2018   Procedure: SECOND  STAGE BASILIC VEIN TRANSPOSITION RIGHT UPPER EXTREMITY;  Surgeon: Marty Heck, MD;  Location: [place];  Service: Vascular;  Laterality: Right;  . CHOLECYSTECTOMY OPEN    . COLON SURGERY    . COLONOSCOPY    . EXCISIONAL TOTAL KNEE ARTHROPLASTY WITH ANTIBIOTIC SPACERS Left 02/06/2017   Procedure: Incisional total iknee with antibiotic spacer ;  Surgeon: Leandrew Koyanagi, MD;  Location: [place];  Service: Orthopedics;  Laterality: Left;  . IR FLUORO GUIDE CV LINE RIGHT  02/10/2017  . IR REMOVAL TUN CV CATH W/O FL  04/01/2017  . IR US GUIDE VASC ACCESS RIGHT  02/10/2017  .  IRRIGATION AND DEBRIDEMENT KNEE Left 03/12/2017   Procedure: IRRIGATION AND DEBRIDEMENT LEFT KNEE WITH WOUND VAC APPLICATION;  Surgeon: Leandrew Koyanagi, MD;  Location: [place];  Service: Orthopedics;  Laterality: Left;  . JOINT REPLACEMENT    . KNEE ARTHROSCOPY Right 08/10/2014   Procedure: ARTHROSCOPY I & D KNEE;  Surgeon: Marianna Payment, MD;  [facility];  Service: Orthopedics;  Laterality: Right;  . KNEE ARTHROSCOPY Left 08/11/2014   Procedure: ARTHROSCOPIC WASHOUT LEFT KNEE;  Surgeon: Marianna Payment, MD;  Location: [place];  Service: Orthopedics;  Laterality: Left;  . KNEE ARTHROSCOPY Left 08/19/2014   Procedure: ARTHROSCOPIC WASHOUT LEFT KNEE;  Surgeon: Leandrew Koyanagi, MD;  Location: [place];  Service: Orthopedics;  Laterality: Left;  . KNEE ARTHROSCOPY WITH LATERAL MENISECTOMY Left 04/04/2015   Procedure: AND PARTIAL LATERAL MENISECTOMY;  Surgeon: Leandrew Koyanagi, MD;  Location: [place];  Service: Orthopedics;  Laterality: Left;  . KNEE ARTHROSCOPY WITH MEDIAL MENISECTOMY Left 04/04/2015   Procedure: LEFT KNEE ARTHROSCOPY WITH PARTIAL MEDIAL MENISCECTOMY  AND SYNOVECTOMY;  Surgeon: Leandrew Koyanagi, MD;  Location: [place];  Service: Orthopedics;  Laterality: Left;  . PERIPHERAL VASCULAR BALLOON ANGIOPLASTY  07/07/2018   Procedure: PERIPHERAL VASCULAR BALLOON ANGIOPLASTY;  Surgeon: Marty Heck, MD;  Location: [place] [facility];  Service: Cardiovascular;;  right AV fistula  . POLYPECTOMY    . SHOULDER ARTHROSCOPY Bilateral 08/10/2014   Procedure: I & D BILATERAL SHOULDERS ;  Surgeon: Marianna Payment, MD;  [facility];  Service: Orthopedics;  Laterality: Bilateral;  . SMALL INTESTINE SURGERY     Due to Small Bowel Obstruction; "fixed it when they did my gallbladder OR"  . TEE WITHOUT CARDIOVERSION N/A 08/14/2014   Procedure: TRANSESOPHAGEAL ECHOCARDIOGRAM (TEE);  Surgeon: Thayer Headings, MD;  Location: [place];  Service: Cardiovascular;   Laterality: N/A;  . TENOSYNOVECTOMY Right 08/11/2014   Procedure: RIGHT WRIST IRRIGATION AND DEBRIDEMENT, TENOSYNOVECTOMY;  Surgeon: Marianna Payment, MD;  Location: [place];  Service: Orthopedics;  Laterality: Right;  . TOTAL KNEE ARTHROPLASTY Left 11/07/2015   Procedure: LEFT TOTAL KNEE ARTHROPLASTY WITH REVISION OF IMPLANTS;  Surgeon: Leandrew Koyanagi, MD;  Location: [place];  Service: Orthopedics;  Laterality: Left;  . TUBAL LIGATION       reports that she has been smoking cigarettes. She has a 17.50 pack-year smoking history. She has never used smokeless tobacco. She reports previous alcohol use. She reports that she does not use drugs.  Allergies  Allergen Reactions  . Compazine [Prochlorperazine] Shortness Of Breath, Swelling and Other (See Comments)    TONGUE SWELLS  . Shellfish-Derived Products Anaphylaxis  . Iodinated Diagnostic Agents Hives and Rash  . Omnipaque [Iohexol] Hives  . Tape     Brown paper tape caused skin irritation  . Sulfa Antibiotics Rash  Family History  Problem Relation Age of Onset  . Uterine cancer Mother   . Heart disease Father   . Hypertension Sister   . Uterine cancer Sister   . Cancer Brother        unsure type  . Diabetes Brother   . Hypertension Brother   . Colon cancer Neg Hx   . Colon polyps Neg Hx   . Esophageal cancer Neg Hx   . Rectal cancer Neg Hx   . Stomach cancer Neg Hx      Prior to Admission medications   Medication Sig Start Date End Date Taking? Authorizing Provider  aspirin EC 81 MG tablet Take 81 mg by mouth daily.   Yes [provider]  atorvastatin (LIPITOR) 10 MG tablet TAKE 1 TABLET BY MOUTH AT BEDTIME . APPOINTMENT REQUIRED FOR FUTURE REFILLS Patient taking differently: Take 10 mg by mouth at bedtime.  10/31/19  Yes [name], MD  Chlorpheniramine Maleate (ALLERGY PO) Take 1 tablet by mouth daily as needed (allergies).   Yes [provider]  lidocaine-prilocaine (EMLA) cream Apply 1 application  topically once. On days of dialysis 3x week 10/25/18  Yes [provider]  Methoxy PEG-Epoetin Beta (MIRCERA IJ) Get it at dialysis 06/30/19 06/28/20 Yes [provider]  sevelamer carbonate (RENVELA) 800 MG tablet Take 1,600 mg by mouth 3 (three) times daily with meals.   Yes [provider]  Accu-Chek Softclix Lancets lancets Check sugars three times a day for E11.65 09/20/18   [name], MD  amLODipine (NORVASC) 10 MG tablet Take 1 tablet (10 mg total) by mouth at bedtime. Patient not taking: Reported on 01/27/2020 03/31/17   Angiulli, Lavon Paganini, PA-C  blood glucose meter kit and supplies KIT Dispense based on patient and insurance preference. Use up to four times daily as directed 10/13/19   [name], MD  Blood Glucose Monitoring Suppl (ACCU-CHEK GUIDE) w/Device KIT 1 Device by Does not apply route daily. 10/31/19   [name], MD  calcium acetate (PHOSLO) 667 MG capsule Take 2 capsules by mouth 3 (three) times daily. 01/13/19   [provider]  cloNIDine (CATAPRES) 0.2 MG tablet Take 0.2 mg by mouth 2 (two) times daily.    [provider]  Ferrous Sulfate (IRON PO) Take 325 mg by mouth daily.     [provider]  gabapentin (NEURONTIN) 100 MG capsule Take 200 mg by mouth 3 (three) times daily. 11/01/18   [provider]  gabapentin (NEURONTIN) 300 MG capsule Take 300 mg by mouth at bedtime.  01/13/20   [provider]  glucose blood (ACCU-CHEK GUIDE) test strip Check blood sugar up to 4 times a day 10/20/19   [name], MD  hydrALAZINE (APRESOLINE) 100 MG tablet Take 100 mg by mouth as directed. Take only on non dialysis day    [provider]  Lancet Devices [facility]) lancets Check sugars TID for E11.65 01/29/15   Chari Manning A, NP  methocarbamol (ROBAXIN) 500 MG tablet Take 500 mg by mouth every 8 (eight) hours as needed for muscle spasms.  06/08/19   [provider]  multivitamin  (RENA-VIT) TABS tablet Take 1 tablet by mouth daily. 03/01/19   [provider]  omeprazole (PRILOSEC) 20 MG capsule TAKE 1 CAPSULE BY MOUTH ONCE DAILY BEFORE BREAKFAST . APPOINTMENT REQUIRED FOR FUTURE REFILLS Patient taking differently: Take 20 mg by mouth daily.  11/04/19   [name], MD  polyethylene glycol (MIRALAX / GLYCOLAX) 17 g packet Take 17 g by mouth daily as needed for moderate constipation. Patient not taking: Reported on 01/27/2020 12/10/18   [name], MD  sertraline (ZOLOFT) 100 MG tablet Take 1 tablet by mouth once daily 01/23/20   [name], MD  traZODone (DESYREL) 50 MG tablet Take 50 mg by mouth at bedtime. Patient not taking: Reported on 01/27/2020 10/27/18   [provider]    Physical Exam: Vitals:   01/27/20 0426 01/27/20 0647 01/27/20 0813 01/27/20 1000  BP: 131/78 130/70 (!) 144/66 (!) 149/70  Pulse: 94 91 89 87  Resp: _0 Temp: 98.6 F (37 C) 98 F (36.7 C) 98.9 F (37.2 C)   TempSrc: Oral  Oral   SpO2: 98% 100% 98% 99%  Weight:      Height:        Constitutional: NAD, calm, comfortable Vitals:   01/27/20 0426 01/27/20 0647 01/27/20 0813 01/27/20 1000  BP: 131/78 130/70 (!) 144/66 (!) 149/70  Pulse: 94 91 89 87  Resp: _1 Temp: 98.6 F (37 C) 98 F (36.7 C) 98.9 F (37.2 C)   TempSrc: Oral  Oral   SpO2: 98% 100% 98% 99%  Weight:      Height:       Eyes: PERRL, lids and conjunctivae normal ENMT: Mucous membranes are moist. Posterior pharynx clear of any exudate or lesions.Normal dentition.  Neck: normal, supple, no masses, no thyromegaly Respiratory: clear to auscultation bilaterally, no wheezing, no crackles. Normal respiratory effort. No accessory muscle use.  Cardiovascular: Regular rate and rhythm, no murmurs / rubs / gallops. No extremity edema. 2+ pedal pulses. No carotid bruits.  Abdomen: Mild tenderness on deep palpation on LLQ, no rebound or guarding, no masses palpated. No hepatosplenomegaly.  Bowel sounds positive.  Musculoskeletal: no clubbing / cyanosis. No joint deformity upper and lower extremities. Good ROM, no contractures. Normal muscle tone.  Skin: no rashes, lesions, ulcers. No induration Neurologic: CN 2-12 grossly intact. Sensation intact, DTR normal. Strength 5/5 in all 4.  Psychiatric: Normal judgment and insight. Alert and oriented x 3. Normal mood.    Labs on Admission: I have personally reviewed following labs and imaging studies  CBC: Recent Labs  Lab 01/26/20 1522 01/27/20 1008  WBC 4.6 3.9*  HGB 7.7* 7.4*  HCT 25.7* 25.7*  MCV 101.6* 101.6*  PLT 221 797   Basic Metabolic Panel: Recent Labs  Lab 01/26/20 1522  NA 136  K 3.3*  CL 94*  CO2 29  GLUCOSE 112*  BUN 10  CREATININE 2.32*  CALCIUM 8.1*   GFR: Estimated Creatinine Clearance: 23.2 mL/min (A) (by C-G formula based on SCr of 2.32 mg/dL (H)). Liver Function Tests: Recent Labs  Lab 01/26/20 1522  AST 14*  ALT 23  ALKPHOS 75  BILITOT 0.3  PROT 6.8  ALBUMIN 3.5   No results for input(s): LIPASE, AMYLASE in the last 168 hours. No results for input(s): AMMONIA in the last 168 hours. Coagulation Profile: No results for input(s): INR, PROTIME in the last 168 hours. Cardiac Enzymes: No results for input(s): CKTOTAL, CKMB, CKMBINDEX, TROPONINI in the last 168 hours. BNP (last 3 results) No results for input(s): PROBNP in the last 8760 hours. HbA1C: No results for input(s): HGBA1C in the last 72 hours. CBG: No results for input(s): GLUCAP in the last 168 hours. Lipid Profile: No results for input(s): CHOL, HDL, LDLCALC, TRIG, CHOLHDL, LDLDIRECT in the  last 72 hours. Thyroid Function Tests: No results for input(s): TSH, T4TOTAL, FREET4, T3FREE, THYROIDAB in the last 72 hours. Anemia Panel: No results for input(s): VITAMINB12, FOLATE, FERRITIN, TIBC, IRON, RETICCTPCT in the last 72 hours. Urine analysis:    Component Value Date/Time   COLORURINE AMBER (A) 02/08/2017 1138    APPEARANCEUR TURBID (A) 02/08/2017 1138   LABSPEC 1.014 02/08/2017 1138   PHURINE 5.0 02/08/2017 1138   GLUCOSEU NEGATIVE 02/08/2017 1138   HGBUR MODERATE (A) 02/08/2017 1138   BILIRUBINUR NEGATIVE 02/08/2017 1138   KETONESUR NEGATIVE 02/08/2017 1138   PROTEINUR 100 (A) 02/08/2017 1138   UROBILINOGEN 0.2 09/02/2014 1504   NITRITE NEGATIVE 02/08/2017 1138   LEUKOCYTESUR LARGE (A) 02/08/2017 1138    Radiological Exams on Admission: No results found.  EKG: None  Assessment/Plan Active Problems:   Anemia  (please populate well all problems here in Problem List. (For example, if patient is on BP meds at home and you resume or decide to hold them, it is a problem that needs to be her. Same for CAD, COPD, HLD and so on)  Symptomatic anemia, acute on chronic, macrocytic, probably GI source -one unit PRBC tonight -Monitor volume status -Expect overnight observation, if Hb stable and no signs of fluid overload, pt likely can go home in AM. GI was contacted, and plan for EGD and colonoscope outpatient. -Check Iron and Reticular count and B12  Abdominal pain -Most recent imaging study was in 2018 showing retrocalcaneal lymphadenopathy inflammatory versus malignancy, given that the pain has been going on for >6 months and worsening of anemia, will send CT abd/pelvic w/o contrast  HTN -Start home BP meds   ESRD -No acute issue, resume regular HD schedule as outpt.  IDDM -A1C 4.4 in May, off DM meds   DVT prophylaxis: SCD Code Status: Full Family Communication: None at bedside Disposition Plan: Likely can go home in the next 24 hours after PRBCs and if repeat H&H stable Consults called: [facility] Admission status: Tele Obs   Lequita Halt MD [facility] Pager [phone]  01/27/2020, 1:34 PM

## 2020-01-28 DIAGNOSIS — D649 Anemia, unspecified: Secondary | ICD-10-CM | POA: Diagnosis not present

## 2020-01-28 DIAGNOSIS — K922 Gastrointestinal hemorrhage, unspecified: Secondary | ICD-10-CM | POA: Diagnosis not present

## 2020-01-28 LAB — CBC
HCT: 27.9 % — ABNORMAL LOW (ref 36.0–46.0)
Hemoglobin: 8.3 g/dL — ABNORMAL LOW (ref 12.0–15.0)
MCH: 29.7 pg (ref 26.0–34.0)
MCHC: 29.7 g/dL — ABNORMAL LOW (ref 30.0–36.0)
MCV: 100 fL (ref 80.0–100.0)
Platelets: 186 10*3/uL (ref 150–400)
RBC: 2.79 MIL/uL — ABNORMAL LOW (ref 3.87–5.11)
RDW: 18.4 % — ABNORMAL HIGH (ref 11.5–15.5)
WBC: 3.9 10*3/uL — ABNORMAL LOW (ref 4.0–10.5)
nRBC: 0 % (ref 0.0–0.2)

## 2020-01-28 LAB — IRON AND TIBC
Iron: 151 ug/dL (ref 28–170)
Saturation Ratios: 38 % — ABNORMAL HIGH (ref 10.4–31.8)
TIBC: 398 ug/dL (ref 250–450)
UIBC: 247 ug/dL

## 2020-01-28 LAB — TYPE AND SCREEN
ABO/RH(D): A POS
Antibody Screen: NEGATIVE
Unit division: 0

## 2020-01-28 LAB — VITAMIN B12: Vitamin B-12: 1029 pg/mL — ABNORMAL HIGH (ref 180–914)

## 2020-01-28 LAB — BPAM RBC
Blood Product Expiration Date: 202109032359
ISSUE DATE / TIME: 202108131802
Unit Type and Rh: 6200

## 2020-01-28 LAB — BASIC METABOLIC PANEL
Anion gap: 9 (ref 5–15)
BUN: 25 mg/dL — ABNORMAL HIGH (ref 8–23)
CO2: 30 mmol/L (ref 22–32)
Calcium: 8.8 mg/dL — ABNORMAL LOW (ref 8.9–10.3)
Chloride: 99 mmol/L (ref 98–111)
Creatinine, Ser: 4.95 mg/dL — ABNORMAL HIGH (ref 0.44–1.00)
GFR calc Af Amer: 10 mL/min — ABNORMAL LOW (ref >60)
GFR calc non Af Amer: 9 mL/min — ABNORMAL LOW (ref >60)
Glucose, Bld: 121 mg/dL — ABNORMAL HIGH (ref 70–99)
Potassium: 3.3 mmol/L — ABNORMAL LOW (ref 3.5–5.1)
Sodium: 138 mmol/L (ref 135–145)

## 2020-01-28 LAB — FERRITIN: Ferritin: 78 ng/mL (ref 11–307)

## 2020-01-28 LAB — MRSA PCR SCREENING: MRSA by PCR: NEGATIVE

## 2020-01-28 MED ORDER — PANTOPRAZOLE SODIUM 40 MG PO TBEC: 40.0000 mg | DELAYED_RELEASE_TABLET | Freq: Every day | ORAL | 0 refills | Status: DC

## 2020-01-28 MED ORDER — LOPERAMIDE HCL 2 MG PO CAPS
2.0000 mg | ORAL_CAPSULE | ORAL | Status: AC | PRN
  Administered 2020-01-28: 2 mg via ORAL
  Filled 2020-01-28: qty 1

## 2020-01-28 NOTE — Progress Notes (Signed)
Pt is stable at this time. Pt is alert and oriented, sitting on the side of the bed eating lunch. Pt states she understands all instructions. No other questions or concerns at this time. No needs or concerns at this time. Son was called at approximately 12:15. He stated he would be here in about 103mins to pick her up and take her HD.

## 2020-01-28 NOTE — Discharge Summary (Signed)
Physician Discharge Summary  Brandy Houston BHA:[id number] DOB: 05-02-1956 DOA: 01/26/2020  PCP: [name], MD  Admit date: 01/26/2020 Discharge date: 01/28/2020  Admitted From: Home Disposition:  Home  Recommendations for Outpatient Follow-up:  1. Follow up with PCP in 1-2 weeks 2. Follow up with [facility] as scheduled on 8/25 with Dr. Silverio Decamp  Discharge Condition:Stable CODE STATUS:Full Diet recommendation: Renal, diabetic   Brief/Interim Summary: 64 y.o. female with medical history significant of chronic anemia secondary to chronic kidney disease and iron deficiency, ESRD in HD via right arm A-V graft (since April 2021), HTN, dCHF, IDDM, left AKA, presented with worsening of anemia. Patient has been feeling tired lately and went to PCP to have a blood work done which showed her hemoglobin level decreased from 8.63 months ago to below 7 and sent to the ED. patient was admitted to hospital for worsening of anemia 2 times this year, had GI try to do colonoscopy on both occasions but with suboptimal results.  And patient was scheduled to have another colonoscopy August 25.  Patient also reported she has been having on and off left lower quadrant abdominal dull pain for the last 52-month  She reported her stool has been more with dark-colored since she started to take iron pills, and denied any blood in the stools.  No nauseous vomit, no night sweats, no weight loss  Discharge Diagnoses:  Active Problems:   Symptomatic anemia  Symptomatic anemia, acute on chronic, macrocytic, probably GI source -one unit PRBC given overnight with appropriate improvement in Hgb, up to 8.3 -GI was contacted, and plan for EGD and colonoscope outpatient. -Remained hemodynamically stable  Abdominal pain -Most recent imaging study was in 2018 showing retrocalcaneal lymphadenopathy inflammatory versus malignancy, given that the pain has been going on for >6 months and worsening of anemia -reviewed CT  abd, unremarkable findings for acute process  HTN -continued on home BP meds   ESRD -No acute issue this visit -Have discussed with Nephrology who will assist with arranging outpt HD session. Pt on TTS HD  IDDM -A1C 4.4 in May, off DM meds   Discharge Instructions   Allergies as of 01/28/2020      Reactions   Compazine [prochlorperazine] Shortness Of Breath, Swelling, Other (See Comments)   TONGUE SWELLS   Shellfish-derived Products Anaphylaxis   Iodinated Diagnostic Agents Hives, Rash   Omnipaque [iohexol] Hives   Tape    Brown paper tape caused skin irritation   Sulfa Antibiotics Rash      Medication List    STOP taking these medications   amLODipine 10 MG tablet Commonly known as: NORVASC   omeprazole 20 MG capsule Commonly known as: PRILOSEC Replaced by: pantoprazole 40 MG tablet     TAKE these medications   Accu-Chek Guide test strip Generic drug: glucose blood Check blood sugar up to 4 times a day   Accu-Chek Guide w/Device Kit 1 Device by Does not apply route daily.   accu-chek softclix lancets Check sugars TID for E11.65   Accu-Chek Softclix Lancets lancets Check sugars three times a day for E11.65   ALLERGY PO Take 1 tablet by mouth daily as needed (allergies).   aspirin EC 81 MG tablet Take 81 mg by mouth daily.   atorvastatin 10 MG tablet Commonly known as: LIPITOR TAKE 1 TABLET BY MOUTH AT BEDTIME . APPOINTMENT REQUIRED FOR FUTURE REFILLS What changed: See the new instructions.   blood glucose meter kit and supplies Kit Dispense based on patient  and insurance preference. Use up to four times daily as directed   calcium acetate 667 MG capsule Commonly known as: PHOSLO Take 2 capsules by mouth 3 (three) times daily.   cloNIDine 0.2 MG tablet Commonly known as: CATAPRES Take 0.2 mg by mouth 2 (two) times daily.   gabapentin 100 MG capsule Commonly known as: NEURONTIN Take 200 mg by mouth 3 (three) times daily.   gabapentin 300  MG capsule Commonly known as: NEURONTIN Take 300 mg by mouth at bedtime.   hydrALAZINE 100 MG tablet Commonly known as: APRESOLINE Take 100 mg by mouth as directed. Take only on non dialysis day   IRON PO Take 325 mg by mouth daily.   lidocaine-prilocaine cream Commonly known as: EMLA Apply 1 application topically once. On days of dialysis 3x week   methocarbamol 500 MG tablet Commonly known as: ROBAXIN Take 500 mg by mouth every 8 (eight) hours as needed for muscle spasms.   MIRCERA IJ Get it at dialysis   multivitamin Tabs tablet Take 1 tablet by mouth daily.   pantoprazole 40 MG tablet Commonly known as: PROTONIX Take 1 tablet (40 mg total) by mouth daily. Start taking on: January 29, 2020 Replaces: omeprazole 20 MG capsule   polyethylene glycol 17 g packet Commonly known as: MIRALAX / GLYCOLAX Take 17 g by mouth daily as needed for moderate constipation.   sertraline 100 MG tablet Commonly known as: ZOLOFT Take 1 tablet by mouth once daily   sevelamer carbonate 800 MG tablet Commonly known as: RENVELA Take 1,600 mg by mouth 3 (three) times daily with meals.   traZODone 50 MG tablet Commonly known as: DESYREL Take 50 mg by mouth at bedtime.       Follow-up Information    [name], MD. Schedule an appointment as soon as possible for a visit in 2 week(s).   Specialty: Family Medicine Contact information: [address] [place] [address] [place] [address] [phone]              Allergies  Allergen Reactions  . Compazine [Prochlorperazine] Shortness Of Breath, Swelling and Other (See Comments)    TONGUE SWELLS  . Shellfish-Derived Products Anaphylaxis  . Iodinated Diagnostic Agents Hives and Rash  . Omnipaque [Iohexol] Hives  . Tape     Brown paper tape caused skin irritation  . Sulfa Antibiotics Rash    Consultations:  GI  Procedures/Studies: CT ABDOMEN PELVIS WO CONTRAST  Result Date: 01/27/2020 CLINICAL DATA:  Left lower quadrant  abdominal pain. EXAM: CT ABDOMEN AND PELVIS WITHOUT CONTRAST TECHNIQUE: Multidetector CT imaging of the abdomen and pelvis was performed following the standard protocol without IV contrast. COMPARISON:  03/28/2017 FINDINGS: Lower chest: Heart mildly enlarged. Small pericardial effusion. Minor lung base linear atelectasis. Hepatobiliary: No focal liver abnormality is seen. Status post cholecystectomy. No biliary dilatation. Pancreas: Unremarkable. No pancreatic ductal dilatation or surrounding inflammatory changes. Spleen: Normal in size without focal abnormality. Adrenals/Urinary Tract: Thickened left adrenal gland without a defined mass. Normal right adrenal gland. Kidneys normal in position. No visualized renal mass, no stone and no hydronephrosis. Ureters normal course and in caliber. No ureteral stones. Stomach/Bowel: Numerous colonic diverticula most prominent along the sigmoid. No evidence of diverticulitis, colonic wall thickening or other inflammatory process. There are mildly dilated central abdominal small bowel loops, without wall thickening. This is likely due to normal peristalsis. There is no evidence of obstruction. Remainder of the small bowel is unremarkable. There are surgical vascular clips in the right lower  quadrant. Vascular/Lymphatic: Aortic and branch vessel atherosclerosis. No aneurysm. No enlarged lymph nodes. Reproductive: Uterus mildly enlarged and lobulated consistent with multiple fibroids, similar to the prior CT. No adnexal masses. Bilateral tubal ligation clips. Other: Trace amount of ascites anterior to the liver. No abdominal wall hernia. Musculoskeletal: Mild compression fractures of L1 and L3. Mild generalized loss height of the L5 vertebra. These findings are stable from the prior CT. No acute fracture. No osteoblastic or osteolytic lesions. IMPRESSION: 1. No acute findings. There are colonic diverticula, but there is no evidence of diverticulitis or other bowel inflammatory  process. 2. Mild cardiomegaly and small pericardial effusion similar to the prior CT. 3. Aortic and branch vessel atherosclerosis. 4. Uterine fibroids. 5. Old L1 and L3 vertebral compression fractures. Electronically Signed   By: Lajean Manes M.D.   On: 01/27/2020 16:10     Subjective: Eager to go home  Discharge Exam: Vitals:   01/28/20 0509 01/28/20 0817  BP: (!) 142/67 (!) 143/70  Pulse: 84 88  Resp:  18  Temp:  99.2 F (37.3 C)  SpO2:  98%   Vitals:   01/28/20 0505 01/28/20 0509 01/28/20 0521 01/28/20 0817  BP: (!) 151/74 (!) 142/67  (!) 143/70  Pulse: 84 84  88  Resp:    18  Temp:    99.2 F (37.3 C)  TempSrc:    Oral  SpO2:    98%  Weight:   60.4 kg   Height:        General: Pt is alert, awake, not in acute distress Cardiovascular: RRR, S1/S2 +, no rubs, no gallops Respiratory: CTA bilaterally, no wheezing, no rhonchi Abdominal: Soft, NT, ND, bowel sounds + Extremities: no edema, no cyanosis   The results of significant diagnostics from this hospitalization (including imaging, microbiology, ancillary and laboratory) are listed below for reference.     Microbiology: Recent Results (from the past 240 hour(s))  SARS Coronavirus 2 by RT PCR (hospital order, performed in [facility] hospital lab) Nasopharyngeal Nasopharyngeal Swab     Status: None   Collection Time: 01/27/20 10:59 AM   Specimen: Nasopharyngeal Swab  Result Value Ref Range Status   SARS Coronavirus 2 NEGATIVE NEGATIVE Final    Comment: (NOTE) SARS-CoV-2 target nucleic acids are NOT DETECTED.  The SARS-CoV-2 RNA is generally detectable in upper and lower respiratory specimens during the acute phase of infection. The lowest concentration of SARS-CoV-2 viral copies this assay can detect is 250 copies / mL. A negative result does not preclude SARS-CoV-2 infection and should not be used as the sole basis for treatment or other patient management decisions.  A negative result may occur with improper  specimen collection / handling, submission of specimen other than nasopharyngeal swab, presence of viral mutation(s) within the areas targeted by this assay, and inadequate number of viral copies (<250 copies / mL). A negative result must be combined with clinical observations, patient history, and epidemiological information.  Fact Sheet for Patients:   StrictlyIdeas.no  Fact Sheet for Healthcare Providers: BankingDealers.co.za  This test is not yet approved or  cleared by the [place] FDA and has been authorized for detection and/or diagnosis of SARS-CoV-2 by FDA under an Emergency Use Authorization (EUA).  This EUA will remain in effect (meaning this test can be used) for the duration of the COVID-19 declaration under Section 564(b)(1) of the Act, 21 U.S.C. section 360bbb-3(b)(1), unless the authorization is terminated or revoked sooner.  Performed at [facility], [place]  [address] [place], [place] [address]   MRSA PCR Screening     Status: None   Collection Time: 01/27/20 11:40 PM   Specimen: Nasopharyngeal Swab  Result Value Ref Range Status   MRSA by PCR NEGATIVE NEGATIVE Final    Comment:        The GeneXpert MRSA Assay (FDA approved for NASAL specimens only), is one component of a comprehensive MRSA colonization surveillance program. It is not intended to diagnose MRSA infection nor to guide or monitor treatment for MRSA infections. Performed at [facility], [place] [address] [place],  [address]      Labs: BNP (last 3 results) No results for input(s): BNP in the last 8760 hours. Basic Metabolic Panel: Recent Labs  Lab 01/26/20 1522 01/28/20 0029  NA 136 138  K 3.3* 3.3*  CL 94* 99  CO2 29 30  GLUCOSE 112* 121*  BUN 10 25*  CREATININE 2.32* 4.95*  CALCIUM 8.1* 8.8*   Liver Function Tests: Recent Labs  Lab 01/26/20 1522  AST 14*  ALT 23  ALKPHOS 75  BILITOT 0.3  PROT 6.8   ALBUMIN 3.5   No results for input(s): LIPASE, AMYLASE in the last 168 hours. No results for input(s): AMMONIA in the last 168 hours. CBC: Recent Labs  Lab 01/26/20 1522 01/27/20 1008 01/28/20 0029  WBC 4.6 3.9* 3.9*  HGB 7.7* 7.4* 8.3*  HCT 25.7* 25.7* 27.9*  MCV 101.6* 101.6* 100.0  PLT 221 198 186   Cardiac Enzymes: No results for input(s): CKTOTAL, CKMB, CKMBINDEX, TROPONINI in the last 168 hours. BNP: Invalid input(s): POCBNP CBG: Recent Labs  Lab 01/27/20 2136  GLUCAP 169*   D-Dimer No results for input(s): DDIMER in the last 72 hours. Hgb A1c No results for input(s): HGBA1C in the last 72 hours. Lipid Profile No results for input(s): CHOL, HDL, LDLCALC, TRIG, CHOLHDL, LDLDIRECT in the last 72 hours. Thyroid function studies No results for input(s): TSH, T4TOTAL, T3FREE, THYROIDAB in the last 72 hours.  Invalid input(s): FREET3 Anemia work up Recent Labs    01/27/20 1344 01/28/20 0029  VITAMINB12  --  1,029*  FERRITIN  --  78  TIBC  --  398  IRON  --  151  RETICCTPCT 5.3*  --    Urinalysis    Component Value Date/Time   COLORURINE AMBER (A) 02/08/2017 1138   APPEARANCEUR TURBID (A) 02/08/2017 1138   LABSPEC 1.014 02/08/2017 1138   PHURINE 5.0 02/08/2017 1138   GLUCOSEU NEGATIVE 02/08/2017 1138   HGBUR MODERATE (A) 02/08/2017 1138   BILIRUBINUR NEGATIVE 02/08/2017 1138   KETONESUR NEGATIVE 02/08/2017 1138   PROTEINUR 100 (A) 02/08/2017 1138   UROBILINOGEN 0.2 09/02/2014 1504   NITRITE NEGATIVE 02/08/2017 1138   LEUKOCYTESUR LARGE (A) 02/08/2017 1138   Sepsis Labs Invalid input(s): PROCALCITONIN,  WBC,  LACTICIDVEN Microbiology Recent Results (from the past 240 hour(s))  SARS Coronavirus 2 by RT PCR (hospital order, performed in [facility] hospital lab) Nasopharyngeal Nasopharyngeal Swab     Status: None   Collection Time: 01/27/20 10:59 AM   Specimen: Nasopharyngeal Swab  Result Value Ref Range Status   SARS Coronavirus 2 NEGATIVE  NEGATIVE Final    Comment: (NOTE) SARS-CoV-2 target nucleic acids are NOT DETECTED.  The SARS-CoV-2 RNA is generally detectable in upper and lower respiratory specimens during the acute phase of infection. The lowest concentration of SARS-CoV-2 viral copies this assay can detect is 250 copies / mL. A negative result does not preclude SARS-CoV-2  infection and should not be used as the sole basis for treatment or other patient management decisions.  A negative result may occur with improper specimen collection / handling, submission of specimen other than nasopharyngeal swab, presence of viral mutation(s) within the areas targeted by this assay, and inadequate number of viral copies (<250 copies / mL). A negative result must be combined with clinical observations, patient history, and epidemiological information.  Fact Sheet for Patients:   StrictlyIdeas.no  Fact Sheet for Healthcare Providers: BankingDealers.co.za  This test is not yet approved or  cleared by the [place] FDA and has been authorized for detection and/or diagnosis of SARS-CoV-2 by FDA under an Emergency Use Authorization (EUA).  This EUA will remain in effect (meaning this test can be used) for the duration of the COVID-19 declaration under Section 564(b)(1) of the Act, 21 U.S.C. section 360bbb-3(b)(1), unless the authorization is terminated or revoked sooner.  Performed at [facility], [place] [address], [place], [place] [address]   MRSA PCR Screening     Status: None   Collection Time: 01/27/20 11:40 PM   Specimen: Nasopharyngeal Swab  Result Value Ref Range Status   MRSA by PCR NEGATIVE NEGATIVE Final    Comment:        The GeneXpert MRSA Assay (FDA approved for NASAL specimens only), is one component of a comprehensive MRSA colonization surveillance program. It is not intended to diagnose MRSA infection nor to guide or monitor treatment for MRSA  infections. Performed at [facility], [place] [address], [place], [place] [address]    Time spent: 30 min  SIGNED:   Marylu Lund, MD  [facility] 01/28/2020, 10:23 AM  If 7PM-7AM, please contact night-coverage

## 2020-01-29 ENCOUNTER — Telehealth: Payer: Self-pay | Admitting: Physician Assistant

## 2020-01-29 NOTE — Telephone Encounter (Signed)
Transition of care contact from inpatient facility  Date of discharge: 01/28/20 Date of contact: 01/26/20 Method: Phone Spoke to: Patient  Patient contacted to discuss transition of care from recent inpatient hospitalization. Patient was admitted to [facility] from 01/26/20-01/28/20 with discharge diagnosis of acute on chronic anemia. Patient did not go to her outpatient HD unit yesterday, reports she thought she had a ride home after but she did not. She states she is feeling well and planning to follow up with HD on Monday.  Medication changes were reviewed. Amlodipine stopped and protonix increased.   Patient will follow up with his/her outpatient HD unit on: 01/30/20  Anice Paganini, PA-C 01/29/2020, 2:33 PM  [facility]

## 2020-01-31 DIAGNOSIS — N186 End stage renal disease: Secondary | ICD-10-CM | POA: Diagnosis not present

## 2020-01-31 DIAGNOSIS — D509 Iron deficiency anemia, unspecified: Secondary | ICD-10-CM | POA: Diagnosis not present

## 2020-01-31 DIAGNOSIS — D631 Anemia in chronic kidney disease: Secondary | ICD-10-CM | POA: Diagnosis not present

## 2020-01-31 DIAGNOSIS — N2581 Secondary hyperparathyroidism of renal origin: Secondary | ICD-10-CM | POA: Diagnosis not present

## 2020-01-31 DIAGNOSIS — D649 Anemia, unspecified: Secondary | ICD-10-CM | POA: Diagnosis not present

## 2020-01-31 DIAGNOSIS — Z992 Dependence on renal dialysis: Secondary | ICD-10-CM | POA: Diagnosis not present

## 2020-01-31 DIAGNOSIS — E1129 Type 2 diabetes mellitus with other diabetic kidney complication: Secondary | ICD-10-CM | POA: Diagnosis not present

## 2020-02-01 DIAGNOSIS — R002 Palpitations: Secondary | ICD-10-CM | POA: Diagnosis not present

## 2020-02-02 DIAGNOSIS — N186 End stage renal disease: Secondary | ICD-10-CM | POA: Diagnosis not present

## 2020-02-02 DIAGNOSIS — N2581 Secondary hyperparathyroidism of renal origin: Secondary | ICD-10-CM | POA: Diagnosis not present

## 2020-02-02 DIAGNOSIS — Z992 Dependence on renal dialysis: Secondary | ICD-10-CM | POA: Diagnosis not present

## 2020-02-02 DIAGNOSIS — E1129 Type 2 diabetes mellitus with other diabetic kidney complication: Secondary | ICD-10-CM | POA: Diagnosis not present

## 2020-02-02 DIAGNOSIS — D631 Anemia in chronic kidney disease: Secondary | ICD-10-CM | POA: Diagnosis not present

## 2020-02-02 DIAGNOSIS — D509 Iron deficiency anemia, unspecified: Secondary | ICD-10-CM | POA: Diagnosis not present

## 2020-02-04 DIAGNOSIS — E1129 Type 2 diabetes mellitus with other diabetic kidney complication: Secondary | ICD-10-CM | POA: Diagnosis not present

## 2020-02-04 DIAGNOSIS — N2581 Secondary hyperparathyroidism of renal origin: Secondary | ICD-10-CM | POA: Diagnosis not present

## 2020-02-04 DIAGNOSIS — D509 Iron deficiency anemia, unspecified: Secondary | ICD-10-CM | POA: Diagnosis not present

## 2020-02-04 DIAGNOSIS — D631 Anemia in chronic kidney disease: Secondary | ICD-10-CM | POA: Diagnosis not present

## 2020-02-04 DIAGNOSIS — N186 End stage renal disease: Secondary | ICD-10-CM | POA: Diagnosis not present

## 2020-02-04 DIAGNOSIS — Z992 Dependence on renal dialysis: Secondary | ICD-10-CM | POA: Diagnosis not present

## 2020-02-07 ENCOUNTER — Encounter: Payer: Self-pay | Admitting: Certified Registered Nurse Anesthetist

## 2020-02-07 DIAGNOSIS — E1129 Type 2 diabetes mellitus with other diabetic kidney complication: Secondary | ICD-10-CM | POA: Diagnosis not present

## 2020-02-07 DIAGNOSIS — Z992 Dependence on renal dialysis: Secondary | ICD-10-CM | POA: Diagnosis not present

## 2020-02-07 DIAGNOSIS — N186 End stage renal disease: Secondary | ICD-10-CM | POA: Diagnosis not present

## 2020-02-07 DIAGNOSIS — D631 Anemia in chronic kidney disease: Secondary | ICD-10-CM | POA: Diagnosis not present

## 2020-02-07 DIAGNOSIS — N2581 Secondary hyperparathyroidism of renal origin: Secondary | ICD-10-CM | POA: Diagnosis not present

## 2020-02-07 DIAGNOSIS — D509 Iron deficiency anemia, unspecified: Secondary | ICD-10-CM | POA: Diagnosis not present

## 2020-02-08 ENCOUNTER — Encounter: Payer: Self-pay | Admitting: Gastroenterology

## 2020-02-08 ENCOUNTER — Other Ambulatory Visit: Payer: Self-pay

## 2020-02-08 ENCOUNTER — Ambulatory Visit (AMBULATORY_SURGERY_CENTER): Payer: Medicare Other | Admitting: Gastroenterology

## 2020-02-08 VITALS — BP 122/57 | HR 94 | Temp 97.1°F | Resp 16 | Ht 67.0 in | Wt 144.0 lb

## 2020-02-08 DIAGNOSIS — D500 Iron deficiency anemia secondary to blood loss (chronic): Secondary | ICD-10-CM

## 2020-02-08 DIAGNOSIS — K2980 Duodenitis without bleeding: Secondary | ICD-10-CM

## 2020-02-08 DIAGNOSIS — D509 Iron deficiency anemia, unspecified: Secondary | ICD-10-CM | POA: Diagnosis not present

## 2020-02-08 DIAGNOSIS — E1122 Type 2 diabetes mellitus with diabetic chronic kidney disease: Secondary | ICD-10-CM | POA: Diagnosis not present

## 2020-02-08 DIAGNOSIS — R195 Other fecal abnormalities: Secondary | ICD-10-CM | POA: Diagnosis not present

## 2020-02-08 DIAGNOSIS — K319 Disease of stomach and duodenum, unspecified: Secondary | ICD-10-CM

## 2020-02-08 DIAGNOSIS — I509 Heart failure, unspecified: Secondary | ICD-10-CM | POA: Diagnosis not present

## 2020-02-08 DIAGNOSIS — K31819 Angiodysplasia of stomach and duodenum without bleeding: Secondary | ICD-10-CM | POA: Diagnosis not present

## 2020-02-08 DIAGNOSIS — N186 End stage renal disease: Secondary | ICD-10-CM | POA: Diagnosis not present

## 2020-02-08 DIAGNOSIS — D649 Anemia, unspecified: Secondary | ICD-10-CM | POA: Diagnosis not present

## 2020-02-08 DIAGNOSIS — K2950 Unspecified chronic gastritis without bleeding: Secondary | ICD-10-CM | POA: Diagnosis not present

## 2020-02-08 MED ORDER — PANTOPRAZOLE SODIUM 40 MG PO TBEC: 40.0000 mg | DELAYED_RELEASE_TABLET | Freq: Two times a day (BID) | ORAL | 3 refills | Status: DC

## 2020-02-08 MED ORDER — SODIUM CHLORIDE 0.9 % IV SOLN: 500.0000 mL | Freq: Once | INTRAVENOUS | Status: DC

## 2020-02-08 MED ORDER — NYSTATIN 100000 UNIT/ML MT SUSP: OROMUCOSAL | 0 refills | Status: DC

## 2020-02-08 NOTE — Progress Notes (Signed)
1453 Ephedrine 10 mg given IV due to low BP, MD updated.

## 2020-02-08 NOTE — Progress Notes (Signed)
Report given to PACU, vss 

## 2020-02-08 NOTE — Progress Notes (Signed)
Robinul 0.1 mg IV given due large amount of secretions upon assessment.  MD made aware, vss 

## 2020-02-08 NOTE — Patient Instructions (Addendum)
YOU HAD AN ENDOSCOPIC PROCEDURE TODAY AT [facility]:   Refer to the procedure report that was given to you for any specific questions about what was found during the examination.  If the procedure report does not answer your questions, please call your gastroenterologist to clarify.  If you requested that your care partner not be given the details of your procedure findings, then the procedure report has been included in a sealed envelope for you to review at your convenience later.  YOU SHOULD EXPECT: Some feelings of bloating in the abdomen. Passage of more gas than usual.  Walking can help get rid of the air that was put into your GI tract during the procedure and reduce the bloating. If you had a lower endoscopy (such as a colonoscopy or flexible sigmoidoscopy) you may notice spotting of blood in your stool or on the toilet paper. If you underwent a bowel prep for your procedure, you may not have a normal bowel movement for a few days.  Please Note:  You might notice some irritation and congestion in your nose or some drainage.  This is from the oxygen used during your procedure.  There is no need for concern and it should clear up in a day or so.  SYMPTOMS TO REPORT IMMEDIATELY:   Following lower endoscopy (colonoscopy or flexible sigmoidoscopy):  Excessive amounts of blood in the stool  Significant tenderness or worsening of abdominal pains  Swelling of the abdomen that is new, acute  Fever of 100F or higher   Following upper endoscopy (EGD)  Vomiting of blood or coffee ground material  New chest pain or pain under the shoulder blades  Painful or persistently difficult swallowing  New shortness of breath  Fever of 100F or higher  Black, tarry-looking stools  For urgent or emergent issues, a gastroenterologist can be reached at any hour by calling [phone]. Do not use MyChart messaging for urgent concerns.    DIET:  We do recommend a small meal at first, but  then you may proceed to your regular diet.  Drink plenty of fluids but you should avoid alcoholic beverages for 24 hours.  ACTIVITY:  You should plan to take it easy for the rest of today and you should NOT DRIVE or use heavy machinery until tomorrow (because of the sedation medicines used during the test).    FOLLOW UP: Our staff will call the number listed on your records 48-72 hours following your procedure to check on you and address any questions or concerns that you may have regarding the information given to you following your procedure. If we do not reach you, we will leave a message.  We will attempt to reach you two times.  During this call, we will ask if you have developed any symptoms of COVID 19. If you develop any symptoms (ie: fever, flu-like symptoms, shortness of breath, cough etc.) before then, please call [phone].  If you test positive for Covid 19 in the 2 weeks post procedure, please call and report this information to [place].    If any biopsies were taken you will be contacted by phone or by letter within the next 1-3 weeks.  Please call us at [phone] if you have not heard about the biopsies in 3 weeks.    SIGNATURES/CONFIDENTIALITY: You and/or your care partner have signed paperwork which will be entered into your electronic medical record.  These signatures attest to the fact that that the information above on  your After Visit Summary has been reviewed and is understood.  Full responsibility of the confidentiality of this discharge information lies with you and/or your care-partner.   Resume medication,per dr. Marcene Brawn will have office to contact you for  scheduling procedure for EGD at hospital and Dr. Silverio Decamp does not want colonoscopy scheduled at this time she wants to schedule at later time.Information given on gastritis and esophagitis.

## 2020-02-08 NOTE — Progress Notes (Signed)
Per Dr. Marcene Brawn did not want colonoscopy scheduled today,she stated "she will schedule at a later time.

## 2020-02-08 NOTE — Op Note (Signed)
[facility] Patient Name: Brandy Houston Procedure Date: 02/08/2020 2:29 PM MRN: [id number] Endoscopist: Mauri Pole , MD Age: 64 Referring MD:  Date of Birth: 1956/04/13 Gender: Female Account #: [id number] Procedure:                Upper GI endoscopy Indications:              Suspected upper gastrointestinal bleeding in                            patient with chronic blood loss, Gastrointestinal                            bleeding of unknown origin Medicines:                Monitored Anesthesia Care Procedure:                Pre-Anesthesia Assessment:                           - Prior to the procedure, a History and Physical                            was performed, and patient medications and                            allergies were reviewed. [name] tolerance of                            previous anesthesia was also reviewed. The risks                            and benefits of the procedure and the sedation                            options and risks were discussed with the patient.                            All questions were answered, and informed consent                            was obtained. Prior Anticoagulants: The patient has                            taken no previous anticoagulant or antiplatelet                            agents. ASA Grade Assessment: III - A patient with                            severe systemic disease. After reviewing the risks                            and benefits, the patient was deemed in  satisfactory condition to undergo the procedure.                           After obtaining informed consent, the endoscope was                            passed under direct vision. Throughout the                            procedure, the patient's blood pressure, pulse, and                            oxygen saturations were monitored continuously. The                            Endoscope was introduced  through the mouth, and                            advanced to the second part of duodenum. The upper                            GI endoscopy was accomplished without difficulty.                            The patient tolerated the procedure well. Scope In: Scope Out: Findings:                 LA Grade B (one or more mucosal breaks greater than                            5 mm, not extending between the tops of two mucosal                            folds) esophagitis with no bleeding was found 35 to                            36 cm from the incisors.                           Patchy, white plaques were found in the proximal                            esophagus.                           Moderate gastric antral vascular ectasia with                            contact bleeding was present in the gastric antrum                            and in the prepyloric region of the stomach.  A few dispersed less than 5 mm erosions with                            stigmata of recent bleeding were found in the                            entire examined stomach. Biopsies were taken with a                            cold forceps for Helicobacter pylori testing.                           Patchy mildly erythematous mucosa was found in the                            duodenal bulb and 2nd part of duodenum. Biopsies                            were taken with a cold forceps for histology. Complications:            No immediate complications. Estimated Blood Loss:     Estimated blood loss was minimal. Impression:               - LA Grade B reflux esophagitis with no bleeding.                           - Esophageal plaques were found, consistent with                            candidiasis.                           - Gastric antral vascular ectasia with bleeding.                           - Erosive gastropathy with stigmata of recent                            bleeding. Biopsied.                            - Erythematous duodenopathy. Biopsied. Recommendation:           - Resume previous diet.                           - Continue present medications.                           - Use Protonix (pantoprazole) 40 mg PO BID.                           - Follow an antireflux regimen.                           - Nystatin suspension 200,000 units PO QID for 5  days.                           - Repeat upper endoscopy at the next available                            appointment for treatment of GAVE with APC, to be                            scheduled at [place] unit. Mauri Pole, MD 02/08/2020 3:29:39 PM This report has been signed electronically.

## 2020-02-08 NOTE — Op Note (Signed)
[facility] Patient Name: Brandy Houston Procedure Date: 02/08/2020 2:29 PM MRN: [id number] Endoscopist: Mauri Pole , MD Age: 64 Referring MD:  Date of Birth: 1955/10/17 Gender: Female Account #: [id number] Procedure:                Colonoscopy Indications:              Evaluation of unexplained GI bleeding presenting                            with fecal occult blood Medicines:                Monitored Anesthesia Care Procedure:                Pre-Anesthesia Assessment:                           - Prior to the procedure, a History and Physical                            was performed, and patient medications and                            allergies were reviewed. [name] tolerance of                            previous anesthesia was also reviewed. The risks                            and benefits of the procedure and the sedation                            options and risks were discussed with the patient.                            All questions were answered, and informed consent                            was obtained. Prior Anticoagulants: The patient has                            taken no previous anticoagulant or antiplatelet                            agents. ASA Grade Assessment: III - A patient with                            severe systemic disease. After reviewing the risks                            and benefits, the patient was deemed in                            satisfactory condition to undergo the procedure.  After obtaining informed consent, the colonoscope                            was passed under direct vision. Throughout the                            procedure, the patient's blood pressure, pulse, and                            oxygen saturations were monitored continuously. The                            Colonoscope was introduced through the anus and                            advanced to the the cecum,  identified by                            appendiceal orifice and ileocecal valve. The                            colonoscopy was technically difficult and complex                            due to inadequate bowel prep. Successful completion                            of the procedure was aided by lavage. The patient                            tolerated the procedure well. The quality of the                            bowel preparation was fair, inadequate to identify                            flat lesions or polyp <38mm in size. The ileocecal                            valve, appendiceal orifice, and rectum were                            photographed. Scope In: 2:55:28 PM Scope Out: 3:20:21 PM Scope Withdrawal Time: 0 hours 13 minutes 24 seconds  Total Procedure Duration: 0 hours 24 minutes 53 seconds  Findings:                 The perianal and digital rectal examinations were                            normal.                           Scattered small and large-mouthed diverticula were  found in the sigmoid colon, descending colon,                            transverse colon and ascending colon.                           Non-bleeding internal hemorrhoids were found during                            retroflexion. The hemorrhoids were small. Complications:            No immediate complications. Estimated Blood Loss:     Estimated blood loss was minimal. Impression:               - Preparation of the colon was fair.                           - Moderate diverticulosis in the sigmoid colon, in                            the descending colon, in the transverse colon and                            in the ascending colon.                           - Non-bleeding internal hemorrhoids.                           - No specimens collected. Recommendation:           - Patient has a contact number available for                            emergencies. The signs and symptoms  of potential                            delayed complications were discussed with the                            patient. Return to normal activities tomorrow.                            Written discharge instructions were provided to the                            patient.                           - Resume previous diet.                           - Continue present medications.                           - Repeat colonoscopy at appointment to be scheduled  because the bowel preparation was suboptimal. Mauri Pole, MD 02/08/2020 3:33:00 PM This report has been signed electronically.

## 2020-02-09 DIAGNOSIS — N2581 Secondary hyperparathyroidism of renal origin: Secondary | ICD-10-CM | POA: Diagnosis not present

## 2020-02-09 DIAGNOSIS — Z992 Dependence on renal dialysis: Secondary | ICD-10-CM | POA: Diagnosis not present

## 2020-02-09 DIAGNOSIS — E1129 Type 2 diabetes mellitus with other diabetic kidney complication: Secondary | ICD-10-CM | POA: Diagnosis not present

## 2020-02-09 DIAGNOSIS — D631 Anemia in chronic kidney disease: Secondary | ICD-10-CM | POA: Diagnosis not present

## 2020-02-09 DIAGNOSIS — N186 End stage renal disease: Secondary | ICD-10-CM | POA: Diagnosis not present

## 2020-02-09 DIAGNOSIS — D509 Iron deficiency anemia, unspecified: Secondary | ICD-10-CM | POA: Diagnosis not present

## 2020-02-10 ENCOUNTER — Telehealth: Payer: Self-pay | Admitting: *Deleted

## 2020-02-10 ENCOUNTER — Telehealth: Payer: Self-pay

## 2020-02-10 NOTE — Telephone Encounter (Signed)
°  Follow up Call-  Call back number 02/08/2020 07/27/2019 06/27/2019  Post procedure Call Back phone  # [phone]  Permission to leave phone message Yes Yes Yes  Some recent data might be hidden     Patient questions:  Message left to all [place] if necessary.  Second call.

## 2020-02-10 NOTE — Telephone Encounter (Signed)
NO ANSWER, MESSAGE LEFT FOR PATIENT. 

## 2020-02-11 DIAGNOSIS — Z992 Dependence on renal dialysis: Secondary | ICD-10-CM | POA: Diagnosis not present

## 2020-02-11 DIAGNOSIS — E1129 Type 2 diabetes mellitus with other diabetic kidney complication: Secondary | ICD-10-CM | POA: Diagnosis not present

## 2020-02-11 DIAGNOSIS — N2581 Secondary hyperparathyroidism of renal origin: Secondary | ICD-10-CM | POA: Diagnosis not present

## 2020-02-11 DIAGNOSIS — N186 End stage renal disease: Secondary | ICD-10-CM | POA: Diagnosis not present

## 2020-02-11 DIAGNOSIS — D631 Anemia in chronic kidney disease: Secondary | ICD-10-CM | POA: Diagnosis not present

## 2020-02-11 DIAGNOSIS — D509 Iron deficiency anemia, unspecified: Secondary | ICD-10-CM | POA: Diagnosis not present

## 2020-02-14 DIAGNOSIS — D509 Iron deficiency anemia, unspecified: Secondary | ICD-10-CM | POA: Diagnosis not present

## 2020-02-14 DIAGNOSIS — N186 End stage renal disease: Secondary | ICD-10-CM | POA: Diagnosis not present

## 2020-02-14 DIAGNOSIS — Z992 Dependence on renal dialysis: Secondary | ICD-10-CM | POA: Diagnosis not present

## 2020-02-14 DIAGNOSIS — N2581 Secondary hyperparathyroidism of renal origin: Secondary | ICD-10-CM | POA: Diagnosis not present

## 2020-02-14 DIAGNOSIS — E1129 Type 2 diabetes mellitus with other diabetic kidney complication: Secondary | ICD-10-CM | POA: Diagnosis not present

## 2020-02-14 DIAGNOSIS — D631 Anemia in chronic kidney disease: Secondary | ICD-10-CM | POA: Diagnosis not present

## 2020-02-15 DIAGNOSIS — E1129 Type 2 diabetes mellitus with other diabetic kidney complication: Secondary | ICD-10-CM | POA: Diagnosis not present

## 2020-02-15 DIAGNOSIS — Z992 Dependence on renal dialysis: Secondary | ICD-10-CM | POA: Diagnosis not present

## 2020-02-15 DIAGNOSIS — N186 End stage renal disease: Secondary | ICD-10-CM | POA: Diagnosis not present

## 2020-02-17 ENCOUNTER — Telehealth: Payer: Self-pay | Admitting: Cardiology

## 2020-02-17 NOTE — Telephone Encounter (Signed)
    Pt returning call for her heart monitor results

## 2020-02-17 NOTE — Telephone Encounter (Signed)
Donato Heinz, MD  02/15/2020 11:08 PM EDT     No significant abnormalities   Left message of results for pt

## 2020-02-22 ENCOUNTER — Telehealth: Payer: Self-pay | Admitting: Gastroenterology

## 2020-02-22 ENCOUNTER — Other Ambulatory Visit: Payer: Self-pay

## 2020-02-22 DIAGNOSIS — K31811 Angiodysplasia of stomach and duodenum with bleeding: Secondary | ICD-10-CM

## 2020-02-23 ENCOUNTER — Other Ambulatory Visit: Payer: Self-pay

## 2020-02-23 ENCOUNTER — Encounter: Payer: Self-pay | Admitting: Gastroenterology

## 2020-02-23 DIAGNOSIS — K31811 Angiodysplasia of stomach and duodenum with bleeding: Secondary | ICD-10-CM

## 2020-02-23 NOTE — Telephone Encounter (Signed)
Spoke with the patient. Explained the EGD with APC scheduled for 05/14/20.  The patient accepts this date. Information and instructions mailed today.

## 2020-02-27 ENCOUNTER — Telehealth: Payer: Self-pay | Admitting: Gastroenterology

## 2020-02-27 NOTE — Telephone Encounter (Signed)
I have not seen a prior authorization request from [facility], called them and they will resend

## 2020-03-15 DIAGNOSIS — Z992 Dependence on renal dialysis: Secondary | ICD-10-CM | POA: Diagnosis not present

## 2020-03-15 DIAGNOSIS — Z23 Encounter for immunization: Secondary | ICD-10-CM | POA: Diagnosis not present

## 2020-03-15 DIAGNOSIS — N186 End stage renal disease: Secondary | ICD-10-CM | POA: Diagnosis not present

## 2020-03-16 DIAGNOSIS — Z992 Dependence on renal dialysis: Secondary | ICD-10-CM | POA: Diagnosis not present

## 2020-03-16 DIAGNOSIS — N186 End stage renal disease: Secondary | ICD-10-CM | POA: Diagnosis not present

## 2020-03-16 DIAGNOSIS — E1129 Type 2 diabetes mellitus with other diabetic kidney complication: Secondary | ICD-10-CM | POA: Diagnosis not present

## 2020-03-17 DIAGNOSIS — N2581 Secondary hyperparathyroidism of renal origin: Secondary | ICD-10-CM | POA: Diagnosis not present

## 2020-03-17 DIAGNOSIS — E1129 Type 2 diabetes mellitus with other diabetic kidney complication: Secondary | ICD-10-CM | POA: Diagnosis not present

## 2020-03-17 DIAGNOSIS — D509 Iron deficiency anemia, unspecified: Secondary | ICD-10-CM | POA: Diagnosis not present

## 2020-03-17 DIAGNOSIS — Z992 Dependence on renal dialysis: Secondary | ICD-10-CM | POA: Diagnosis not present

## 2020-03-17 DIAGNOSIS — N186 End stage renal disease: Secondary | ICD-10-CM | POA: Diagnosis not present

## 2020-03-17 DIAGNOSIS — D631 Anemia in chronic kidney disease: Secondary | ICD-10-CM | POA: Diagnosis not present

## 2020-03-20 DIAGNOSIS — E1129 Type 2 diabetes mellitus with other diabetic kidney complication: Secondary | ICD-10-CM | POA: Diagnosis not present

## 2020-03-20 DIAGNOSIS — N186 End stage renal disease: Secondary | ICD-10-CM | POA: Diagnosis not present

## 2020-03-20 DIAGNOSIS — D631 Anemia in chronic kidney disease: Secondary | ICD-10-CM | POA: Diagnosis not present

## 2020-03-20 DIAGNOSIS — N2581 Secondary hyperparathyroidism of renal origin: Secondary | ICD-10-CM | POA: Diagnosis not present

## 2020-03-20 DIAGNOSIS — Z992 Dependence on renal dialysis: Secondary | ICD-10-CM | POA: Diagnosis not present

## 2020-03-20 DIAGNOSIS — D509 Iron deficiency anemia, unspecified: Secondary | ICD-10-CM | POA: Diagnosis not present

## 2020-03-21 ENCOUNTER — Telehealth: Payer: Self-pay

## 2020-03-21 NOTE — Telephone Encounter (Signed)
[facility] called requesting a certificate of medical necessity and physicians order for incontinence supplies for pt. Please advise.

## 2020-03-21 NOTE — Telephone Encounter (Signed)
Please advise 

## 2020-03-22 DIAGNOSIS — N186 End stage renal disease: Secondary | ICD-10-CM | POA: Diagnosis not present

## 2020-03-22 DIAGNOSIS — D509 Iron deficiency anemia, unspecified: Secondary | ICD-10-CM | POA: Diagnosis not present

## 2020-03-22 DIAGNOSIS — D631 Anemia in chronic kidney disease: Secondary | ICD-10-CM | POA: Diagnosis not present

## 2020-03-22 DIAGNOSIS — Z992 Dependence on renal dialysis: Secondary | ICD-10-CM | POA: Diagnosis not present

## 2020-03-22 DIAGNOSIS — N2581 Secondary hyperparathyroidism of renal origin: Secondary | ICD-10-CM | POA: Diagnosis not present

## 2020-03-22 DIAGNOSIS — E1129 Type 2 diabetes mellitus with other diabetic kidney complication: Secondary | ICD-10-CM | POA: Diagnosis not present

## 2020-03-23 NOTE — Telephone Encounter (Signed)
Ok to give verbal orders?

## 2020-03-24 DIAGNOSIS — N2581 Secondary hyperparathyroidism of renal origin: Secondary | ICD-10-CM | POA: Diagnosis not present

## 2020-03-24 DIAGNOSIS — E1129 Type 2 diabetes mellitus with other diabetic kidney complication: Secondary | ICD-10-CM | POA: Diagnosis not present

## 2020-03-24 DIAGNOSIS — N186 End stage renal disease: Secondary | ICD-10-CM | POA: Diagnosis not present

## 2020-03-24 DIAGNOSIS — D631 Anemia in chronic kidney disease: Secondary | ICD-10-CM | POA: Diagnosis not present

## 2020-03-24 DIAGNOSIS — Z992 Dependence on renal dialysis: Secondary | ICD-10-CM | POA: Diagnosis not present

## 2020-03-24 DIAGNOSIS — D509 Iron deficiency anemia, unspecified: Secondary | ICD-10-CM | POA: Diagnosis not present

## 2020-03-26 ENCOUNTER — Other Ambulatory Visit: Payer: Self-pay | Admitting: Family Medicine

## 2020-03-26 DIAGNOSIS — F32A Depression, unspecified: Secondary | ICD-10-CM

## 2020-03-26 NOTE — Telephone Encounter (Signed)
Spoke with rep from [facility], they are refaxing paperwork to be filled out and signed.

## 2020-03-27 DIAGNOSIS — N2581 Secondary hyperparathyroidism of renal origin: Secondary | ICD-10-CM | POA: Diagnosis not present

## 2020-03-27 DIAGNOSIS — E1129 Type 2 diabetes mellitus with other diabetic kidney complication: Secondary | ICD-10-CM | POA: Diagnosis not present

## 2020-03-27 DIAGNOSIS — N186 End stage renal disease: Secondary | ICD-10-CM | POA: Diagnosis not present

## 2020-03-27 DIAGNOSIS — D631 Anemia in chronic kidney disease: Secondary | ICD-10-CM | POA: Diagnosis not present

## 2020-03-27 DIAGNOSIS — D509 Iron deficiency anemia, unspecified: Secondary | ICD-10-CM | POA: Diagnosis not present

## 2020-03-27 DIAGNOSIS — Z992 Dependence on renal dialysis: Secondary | ICD-10-CM | POA: Diagnosis not present

## 2020-03-28 ENCOUNTER — Ambulatory Visit (HOSPITAL_COMMUNITY): Payer: Medicare Other | Attending: Cardiovascular Disease

## 2020-03-28 ENCOUNTER — Other Ambulatory Visit: Payer: Self-pay

## 2020-03-28 DIAGNOSIS — I351 Nonrheumatic aortic (valve) insufficiency: Secondary | ICD-10-CM

## 2020-03-28 LAB — ECHOCARDIOGRAM COMPLETE
AR max vel: 0.96 cm2
AV Area VTI: 0.98 cm2
AV Area mean vel: 0.99 cm2
AV Mean grad: 18.7 mmHg
AV Peak grad: 34.4 mmHg
Ao pk vel: 2.93 m/s
Area-P 1/2: 5.38 cm2
P 1/2 time: 300 msec
S' Lateral: 3.6 cm

## 2020-03-29 DIAGNOSIS — D631 Anemia in chronic kidney disease: Secondary | ICD-10-CM | POA: Diagnosis not present

## 2020-03-29 DIAGNOSIS — Z992 Dependence on renal dialysis: Secondary | ICD-10-CM | POA: Diagnosis not present

## 2020-03-29 DIAGNOSIS — N186 End stage renal disease: Secondary | ICD-10-CM | POA: Diagnosis not present

## 2020-03-29 DIAGNOSIS — N2581 Secondary hyperparathyroidism of renal origin: Secondary | ICD-10-CM | POA: Diagnosis not present

## 2020-03-29 DIAGNOSIS — D509 Iron deficiency anemia, unspecified: Secondary | ICD-10-CM | POA: Diagnosis not present

## 2020-03-29 DIAGNOSIS — E1129 Type 2 diabetes mellitus with other diabetic kidney complication: Secondary | ICD-10-CM | POA: Diagnosis not present

## 2020-03-31 DIAGNOSIS — D509 Iron deficiency anemia, unspecified: Secondary | ICD-10-CM | POA: Diagnosis not present

## 2020-03-31 DIAGNOSIS — D631 Anemia in chronic kidney disease: Secondary | ICD-10-CM | POA: Diagnosis not present

## 2020-03-31 DIAGNOSIS — Z992 Dependence on renal dialysis: Secondary | ICD-10-CM | POA: Diagnosis not present

## 2020-03-31 DIAGNOSIS — N186 End stage renal disease: Secondary | ICD-10-CM | POA: Diagnosis not present

## 2020-03-31 DIAGNOSIS — N2581 Secondary hyperparathyroidism of renal origin: Secondary | ICD-10-CM | POA: Diagnosis not present

## 2020-03-31 DIAGNOSIS — E1129 Type 2 diabetes mellitus with other diabetic kidney complication: Secondary | ICD-10-CM | POA: Diagnosis not present

## 2020-04-02 NOTE — Progress Notes (Signed)
Cardiology Office Note:    Date:  04/04/2020   ID:  Brandy, Houston May 31, 1956, MRN [id number]  PCP:  [name], MD  Cardiologist:  No primary care provider on file.  Electrophysiologist:  None   Referring MD: [name], MD   Chief Complaint  Patient presents with  . Congestive Heart Failure   History of Present Illness:    Brandy Houston is a 64 y.o. female with a hx of ESRD on HD, type 2 diabetes, moderate aortic regurgitation, hypertension, anemia, septic arthritis s/p L AKA who presents for follow-up.  She was referred by Dr. Jimmy Footman for an evaluation of chest pain on 04/04/2019.  She presented to the ED on 11/27/2018 with chest pain.  Work-up was unremarkable but was notable for low hemoglobin (7.1).  States that since that time she has continued to have intermittent left-sided chest pain.  Occurs about 2 times per week.  Typically occurs after HD.  Describes as cramping pain, 5-6 out of 10 in intensity, worse with inspiration.  Last 5 to 10 minutes.  Not related to exertion.  However, states that she also has a second type of chest pain that she gets when she overexerts herself.  Describes as substernal chest tightness, resolves with rest.  States that she does not get this pain often, only when she overexerts herself in physical therapy.  Last TEE in 07/2014 for bacteremia showed no vegetation, normal LVEF, moderate AI.  Given her symptoms, Carlton Adam Myoview was done on 04/15/2019, which showed small fixed defect at apex, otherwise no abnormalities.  TTE on 04/13/2019 showed mild aortic stenosis, moderate aortic regurgitation, normal EF, grade 2 diastolic dysfunction, normal RV function.  Zio patch x7 days on 02/01/2020 showed one 4 beat run of NSVT, otherwise unremarkable.  Repeat echo on 03/28/2020 was done to follow-up her aortic regurgitation, which showed new LV systolic dysfunction (EF 30 to 35%, global hypokinesis), moderate LVH, grade 2 diastolic dysfunction, normal RV  function, moderate RV enlargement, severe elevation in RVSP (74 mmHg), moderate pericardial effusion, moderate biatrial dilatation, mild to moderate mitral regurgitation, severe tricuspid regurgitation, moderate aortic regurgitation.  Since last clinic visit, she had an EGD done on 02/08/2020.  Showed esophagitis with no bleeding, gastric antral vascular ectasia with bleeding.  Started on pantoprazole 40 mg twice daily and planned follow-up appointment to treat GAVE with APC.  Planned for 11/29.  Reports she has been having chest pain when pulling fluid in HD.  Occasional lightheadedness after HD, no syncope.  Reports dyspnea and fatigue with minimal exertion.  Denies any missed HD appointments.  Does not exercise, walks with walker.     Past Medical History:  Diagnosis Date  . Allergy   . Anemia   . Anxiety   . Arthritis    "in my joints" (03/10/2017)  . Chronic diastolic CHF (congestive heart failure) ([place])   . CKD (chronic kidney disease), stage IV ([place])    stage IV. previous HD, none currently 10/30/15 (confirmed 02/05/2017 & 03/10/2017)  . Depression    Chronic  . Diabetic peripheral neuropathy ([place])   . Dysrhythmia    tachycardia, normal ECHO 08-09-14  . GERD (gastroesophageal reflux disease)   . Heart murmur     dx'd 02/05/2017  . Hepatitis C    "tx'd in 2016; I'm negative now" (02/05/2017)  . History of blood transfusion 2017   "w/knee replacement"  . Hyperlipidemia   . Hypertension   . Osteoarthritis of left knee   .  Peripheral neuropathy   . Protein calorie malnutrition ([place])   . Septic arthritis ([place])   . Slow transit constipation   . Symptomatic anemia 01/27/2020  . Type II diabetes mellitus ([name])    IDDM  . Uncontrolled hypertension 02/18/2013  . Unsteady gait     Past Surgical History:  Procedure Laterality Date  . A/V FISTULAGRAM Right 07/07/2018   Procedure: A/V FISTULAGRAM;  Surgeon: Marty Heck, MD;  Location: [facility];  Service: Cardiovascular;   Laterality: Right;  . AMPUTATION Left 03/20/2017   Procedure: LEFT ABOVE KNEE AMPUTATION;  Surgeon: Newt Minion, MD;  Location: [facility];  Service: Orthopedics;  Laterality: Left;  . APPENDECTOMY    . AV FISTULA PLACEMENT Left 09/13/2014   Procedure: Brachial Artery to Brachial Vein Gortex Four - Seven Stretch GRAFT INSERTION Left Forearm;  Surgeon: Mal Misty, MD;  Location: [facility];  Service: Vascular;  Laterality: Left;  . BASCILIC VEIN TRANSPOSITION Right 10/26/2017   Procedure: BASILIC VEIN TRANSPOSITION FIRST STAGE RIGHT ARM;  Surgeon: Conrad Americus, MD;  Location: [facility];  Service: Vascular;  Laterality: Right;  . BASCILIC VEIN TRANSPOSITION Right 02/01/2018   Procedure: SECOND STAGE BASILIC VEIN TRANSPOSITION RIGHT UPPER EXTREMITY;  Surgeon: Marty Heck, MD;  Location: [facility];  Service: Vascular;  Laterality: Right;  . CHOLECYSTECTOMY OPEN    . COLON SURGERY    . COLONOSCOPY    . EXCISIONAL TOTAL KNEE ARTHROPLASTY WITH ANTIBIOTIC SPACERS Left 02/06/2017   Procedure: Incisional total iknee with antibiotic spacer ;  Surgeon: Leandrew Koyanagi, MD;  Location: [facility];  Service: Orthopedics;  Laterality: Left;  . IR FLUORO GUIDE CV LINE RIGHT  02/10/2017  . IR REMOVAL TUN CV CATH W/O FL  04/01/2017  . IR US GUIDE VASC ACCESS RIGHT  02/10/2017  . IRRIGATION AND DEBRIDEMENT KNEE Left 03/12/2017   Procedure: IRRIGATION AND DEBRIDEMENT LEFT KNEE WITH WOUND VAC APPLICATION;  Surgeon: Leandrew Koyanagi, MD;  Location: [facility];  Service: Orthopedics;  Laterality: Left;  . JOINT REPLACEMENT    . KNEE ARTHROSCOPY Right 08/10/2014   Procedure: ARTHROSCOPY I & D KNEE;  Surgeon: Marianna Payment, MD;  Location: [facility];  Service: Orthopedics;  Laterality: Right;  . KNEE ARTHROSCOPY Left 08/11/2014   Procedure: ARTHROSCOPIC WASHOUT LEFT KNEE;  Surgeon: Marianna Payment, MD;  Location: [facility];  Service: Orthopedics;  Laterality: Left;  . KNEE ARTHROSCOPY Left 08/19/2014   Procedure: ARTHROSCOPIC WASHOUT LEFT  KNEE;  Surgeon: Leandrew Koyanagi, MD;  Location: [facility];  Service: Orthopedics;  Laterality: Left;  . KNEE ARTHROSCOPY WITH LATERAL MENISECTOMY Left 04/04/2015   Procedure: AND PARTIAL LATERAL MENISECTOMY;  Surgeon: Leandrew Koyanagi, MD;  Location: [facility];  Service: Orthopedics;  Laterality: Left;  . KNEE ARTHROSCOPY WITH MEDIAL MENISECTOMY Left 04/04/2015   Procedure: LEFT KNEE ARTHROSCOPY WITH PARTIAL MEDIAL MENISCECTOMY  AND SYNOVECTOMY;  Surgeon: Leandrew Koyanagi, MD;  Location: [facility];  Service: Orthopedics;  Laterality: Left;  . PERIPHERAL VASCULAR BALLOON ANGIOPLASTY  07/07/2018   Procedure: PERIPHERAL VASCULAR BALLOON ANGIOPLASTY;  Surgeon: Marty Heck, MD;  Location: [facility];  Service: Cardiovascular;;  right AV fistula  . POLYPECTOMY    . SHOULDER ARTHROSCOPY Bilateral 08/10/2014   Procedure: I & D BILATERAL SHOULDERS ;  Surgeon: Marianna Payment, MD;  Location: [facility];  Service: Orthopedics;  Laterality: Bilateral;  . SMALL INTESTINE SURGERY     Due to Small  Bowel Obstruction; "fixed it when they did my gallbladder OR"  . TEE WITHOUT CARDIOVERSION N/A 08/14/2014   Procedure: TRANSESOPHAGEAL ECHOCARDIOGRAM (TEE);  Surgeon: Thayer Headings, MD;  Location: [facility];  Service: Cardiovascular;  Laterality: N/A;  . TENOSYNOVECTOMY Right 08/11/2014   Procedure: RIGHT WRIST IRRIGATION AND DEBRIDEMENT, TENOSYNOVECTOMY;  Surgeon: Marianna Payment, MD;  Location: [facility];  Service: Orthopedics;  Laterality: Right;  . TOTAL KNEE ARTHROPLASTY Left 11/07/2015   Procedure: LEFT TOTAL KNEE ARTHROPLASTY WITH REVISION OF IMPLANTS;  Surgeon: Leandrew Koyanagi, MD;  Location: [facility];  Service: Orthopedics;  Laterality: Left;  . TUBAL LIGATION      Current Medications: No outpatient medications have been marked as taking for the 04/04/20 encounter (Office Visit) with Donato Heinz, MD.     Allergies:   Compazine [prochlorperazine], Shellfish-derived  products, Iodinated diagnostic agents, Omnipaque [iohexol], Tape, and Sulfa antibiotics   Social History   Socioeconomic History  . Marital status: Single    Spouse name: Not on file  . Number of children: 3  . Years of education: Not on file  . Highest education level: Not on file  Occupational History  . Occupation: disabled  Tobacco Use  . Smoking status: Current Every Day Smoker    Packs/day: 0.50    Years: 35.00    Pack years: 17.50    Types: Cigarettes  . Smokeless tobacco: Never Used  Vaping Use  . Vaping Use: Never used  Substance and Sexual Activity  . Alcohol use: Not Currently    Alcohol/week: 0.0 standard drinks    Comment: 03/10/2017 "I drink a wine cooler a few times/year"  . Drug use: No  . Sexual activity: Not Currently  Other Topics Concern  . Not on file  Social History Narrative  . Not on file   Social Determinants of Health   Financial Resource Strain:   . Difficulty of Paying Living Expenses: Not on file  Food Insecurity:   . Worried About Charity fundraiser in the Last Year: Not on file  . Ran Out of Food in the Last Year: Not on file  Transportation Needs:   . Lack of Transportation (Medical): Not on file  . Lack of Transportation (Non-Medical): Not on file  Physical Activity:   . Days of Exercise per Week: Not on file  . Minutes of Exercise per Session: Not on file  Stress:   . Feeling of Stress : Not on file  Social Connections:   . Frequency of Communication with Friends and Family: Not on file  . Frequency of Social Gatherings with Friends and Family: Not on file  . Attends Religious Services: Not on file  . Active Member of Clubs or Organizations: Not on file  . Attends Archivist Meetings: Not on file  . Marital Status: Not on file     Family History: [name] family history includes Cancer in her brother; Diabetes in her brother; Heart disease in her father; Hypertension in her brother and sister; Uterine cancer in  her mother and sister. There is no history of Colon cancer, Colon polyps, Esophageal cancer, Rectal cancer, or Stomach cancer.  ROS:   Please see the history of present illness.    All other systems reviewed and are negative.  EKGs/Labs/Other Studies Reviewed:    The following studies were reviewed today:   EKG:  EKG is ordered today.  The ekg ordered today demonstrates normal sinus rhythm, rate 80, Q waves in V1-3, <70mm  ST depression II, aVF  Recent Labs: 06/05/2019: Magnesium 2.7 01/26/2020: ALT 23 01/28/2020: BUN 25; Creatinine, Ser 4.95; Hemoglobin 8.3; Platelets 186; Potassium 3.3; Sodium 138  Recent Lipid Panel    Component Value Date/Time   CHOL 111 12/10/2018 1133   TRIG 82.0 12/10/2018 1133   HDL 61.40 12/10/2018 1133   CHOLHDL 2 12/10/2018 1133   VLDL 16.4 12/10/2018 1133   LDLCALC 33 12/10/2018 1133    [place] 03/3019:  Nuclear stress EF: 51%. No wall motion abnormalities. Low normal ejection fraction.  There was no ST segment deviation noted during stress.  Defect 1: There is a small fixed defect of mild severity at the apex. No evidence of ischemia identified.  This is a low risk study. No ischemia identified.  TTE 04/13/19:   1. The AoV is tricuspid and moderately calcified. Mild aortic stenosis is  present with Vmax 2.9 mmHg and mean gradient 18 mmHg. Moderate aortic  regurgitation is present with PHT 352 msec. The aortic root is normal in  size. This appears to be  degenerative calcific AoV disease.  2. Left ventricular ejection fraction, by visual estimation, is 60 to  65%. The left ventricle has normal function. There is moderately increased  left ventricular hypertrophy.  3. Elevated left atrial pressure.  4. Left ventricular diastolic parameters are consistent with Grade II  diastolic dysfunction (pseudonormalization).  5. The left ventricle has no regional wall motion abnormalities.  6. Global right ventricle has normal systolic  function.The right  ventricular size is normal. No increase in right ventricular wall  thickness.  7. Trivial pericardial effusion is present.  8. Mild calcification of the anterior mitral valve leaflet(s).  9. Mild mitral annular calcification.  10. Mild thickening of the anterior and posterior mitral valve leaflet(s).  11. The mitral valve is degenerative. Trace mitral valve regurgitation.  12. The tricuspid valve is grossly normal. Tricuspid valve regurgitation  is trivial.  13. Aortic valve regurgitation is moderate.  14. The aortic valve is tricuspid. Aortic valve regurgitation is moderate.  Mild aortic valve stenosis.  15. There is Moderate calcification of the aortic valve.  16. There is Moderate thickening of the aortic valve.  17. The pulmonic valve was grossly normal. Pulmonic valve regurgitation is  trivial.  18. Mild plaque invoving the ascending aorta.  19. TR signal is inadequate for assessing pulmonary artery systolic  pressure.  20. The inferior vena cava is normal in size with greater than 50%  respiratory variability, suggesting right atrial pressure of 3 mmHg.   Physical Exam:    VS:  BP 136/70   Ht 5\' 7"  (1.702 m)   Wt 140 lb 9.6 oz (63.8 kg)   LMP 03/16/2002   SpO2 99%   BMI 22.02 kg/m     Wt Readings from Last 3 Encounters:  04/04/20 140 lb 9.6 oz (63.8 kg)  02/08/20 144 lb (65.3 kg)  01/28/20 133 lb 2.5 oz (60.4 kg)     GEN: in no acute distress HEENT: Normal NECK: No JVD; No carotid bruits LYMPHATICS: No lymphadenopathy CARDIAC: RRR, 3/6 systolic murmur RESPIRATORY:  Clear to auscultation without rales, wheezing or rhonchi  ABDOMEN: Soft, non-tender, non-distended MUSCULOSKELETAL:  No edema. S/p L AKA SKIN: Warm and dry NEUROLOGIC:  Alert and oriented x 3 PSYCHIATRIC:  Normal affect   ASSESSMENT:    1. Acute combined systolic and diastolic heart failure ([place])   2. GAVE (gastric antral vascular ectasia)   3. Aortic valve insufficiency,  etiology of  cardiac valve disease unspecified   4. Tricuspid valve insufficiency, unspecified etiology   5. Palpitations   6. Essential hypertension    PLAN:    Acute combined systolic and diastolic heart failure:  Lexiscan Myoview was done for chest pain on 04/15/2019, which showed small fixed defect at apex, otherwise no abnormalities.  TTE on 04/13/2019 showed mild aortic stenosis, moderate aortic regurgitation, normal EF, grade 2 diastolic dysfunction, normal RV function.Repeat echo on 03/28/2020 was done to follow-up her aortic regurgitation, which showed new LV systolic dysfunction (EF 30 to 35%, global hypokinesis), moderate LVH, grade 2 diastolic dysfunction, normal RV function, moderate RV enlargement, severe elevation in RVSP (74 mmHg), moderate pericardial effusion, moderate biatrial dilatation, mild to moderate mitral regurgitation, severe tricuspid regurgitation, moderate aortic regurgitation. -Recommend LHC/RHC for further evaluation, however this is complicated by recent GI bleed.  Had planned APC for GAVE next month.  Will check CBC and discuss with Dr Silverio Decamp.  If no further bleeding, can proceed with cath. -Add carvedilol 3.125 mg BID  Palpitations: Zio patch x7 days on 02/01/2020 showed one 4 beat run of NSVT, otherwise unremarkable.  Aortic regurgitation: Moderate on TTE in 03/2019.  Stable on repeat echo on 03/2020  Tricuspid regurgitation: Severe on echo 03/2020.  Plan cath as above.  Hypertension: On amlodipine 10 mg daily, hydralazine 100 mg 3 times daily on non-HD days, clonidine 0.2 mg twice daily.  Add carvedilol as above  Type 2 diabetes: On insulin.  Last A1c 6.3 on 02/28/2019  HLD: On atorvastatin 10 mg daily.  Last LDL 33 on 12/10/2018  ESRD: On HD  RTC in 1 month  Medication Adjustments/Labs and Tests Ordered: Current medicines are reviewed at length with the patient today.  Concerns regarding medicines are outlined above.  Orders Placed This Encounter    Procedures  . Basic metabolic panel  . CBC  . EKG 12-Lead   Meds ordered this encounter  Medications  . carvedilol (COREG) 3.125 MG tablet    Sig: Take 1 tablet (3.125 mg total) by mouth 2 (two) times daily.    Dispense:  180 tablet    Refill:  3    Patient Instructions     [place] [address] Dept: [phone] Loc: [phone]  Brandy Houston  04/04/2020  You are scheduled for a Cardiac Catheterization on __________  with Dr. ________.  1. Please arrive at the [facility] (Main Entrance A) at [facility]: [address] [place], [place] [address] at ______ (This time is two hours before your procedure to ensure your preparation). Free valet parking service is available.   Special note: Every effort is made to have your procedure done on time. Please understand that emergencies sometimes delay scheduled procedures.  2. Diet: Do not eat solid foods after midnight.  The patient may have clear liquids until 5am upon the day of the procedure.  3. Labs: Today  COVID TEST:  We will schedule this for you once procedure is scheduled [place], [place]  4. Medication instructions in preparation for your procedure:   Contrast Allergy: Yes, Please take Prednisone 50mg  by mouth at: Thirteen hours prior to cath  Seven hours prior to cath  And prior to leaving home please take last dose of Prednisone 50mg  and Benadryl 50mg  by mouth.  On the morning of your procedure, take your Aspirin and any morning medicines NOT listed above.  You may use sips of water.  5. Plan for one night stay--bring personal belongings. 6. Bring a current list of your medications and current insurance cards. 7. You MUST have a responsible person to drive you home. 8. Someone MUST be with you the first 24 hours after you arrive home or your discharge will be  delayed. 9. Please wear clothes that are easy to get on and off and wear slip-on shoes.  Thank you for allowing [place] to care for you!   -- [facility]     START carvedilol (Coreg) 3.125 mg two times daily  Follow up in 1 month with Dr. Gardiner Rhyme    Signed, Donato Heinz, MD  04/04/2020 10:41 PM    [place]

## 2020-04-02 NOTE — H&P (View-Only) (Signed)
Cardiology Office Note:    Date:  04/04/2020   ID:  Brandy Houston, Brandy Houston February 26, 1956, MRN [id number]  PCP:  [name], MD  Cardiologist:  No primary care provider on file.  Electrophysiologist:  None   Referring MD: [name], MD   Chief Complaint  Patient presents with  . Congestive Heart Failure   History of Present Illness:    Brandy Houston is a 63 y.o. female with a hx of ESRD on HD, type 2 diabetes, moderate aortic regurgitation, hypertension, anemia, septic arthritis s/p L AKA who presents for follow-up.  She was referred by Dr. Jimmy Footman for an evaluation of chest pain on 04/04/2019.  She presented to the ED on 11/27/2018 with chest pain.  Work-up was unremarkable but was notable for low hemoglobin (7.1).  States that since that time she has continued to have intermittent left-sided chest pain.  Occurs about 2 times per week.  Typically occurs after HD.  Describes as cramping pain, 5-6 out of 10 in intensity, worse with inspiration.  Last 5 to 10 minutes.  Not related to exertion.  However, states that she also has a second type of chest pain that she gets when she overexerts herself.  Describes as substernal chest tightness, resolves with rest.  States that she does not get this pain often, only when she overexerts herself in physical therapy.  Last TEE in 07/2014 for bacteremia showed no vegetation, normal LVEF, moderate AI.  Given her symptoms, Carlton Adam Myoview was done on 04/15/2019, which showed small fixed defect at apex, otherwise no abnormalities.  TTE on 04/13/2019 showed mild aortic stenosis, moderate aortic regurgitation, normal EF, grade 2 diastolic dysfunction, normal RV function.  Zio patch x7 days on 02/01/2020 showed one 4 beat run of NSVT, otherwise unremarkable.  Repeat echo on 03/28/2020 was done to follow-up her aortic regurgitation, which showed new LV systolic dysfunction (EF 30 to 35%, global hypokinesis), moderate LVH, grade 2 diastolic dysfunction, normal RV  function, moderate RV enlargement, severe elevation in RVSP (74 mmHg), moderate pericardial effusion, moderate biatrial dilatation, mild to moderate mitral regurgitation, severe tricuspid regurgitation, moderate aortic regurgitation.  Since last clinic visit, she had an EGD done on 02/08/2020.  Showed esophagitis with no bleeding, gastric antral vascular ectasia with bleeding.  Started on pantoprazole 40 mg twice daily and planned follow-up appointment to treat GAVE with APC.  Planned for 11/29.  Reports she has been having chest pain when pulling fluid in HD.  Occasional lightheadedness after HD, no syncope.  Reports dyspnea and fatigue with minimal exertion.  Denies any missed HD appointments.  Does not exercise, walks with walker.     Past Medical History:  Diagnosis Date  . Allergy   . Anemia   . Anxiety   . Arthritis    "in my joints" (03/10/2017)  . Chronic diastolic CHF (congestive heart failure) ([place])   . CKD (chronic kidney disease), stage IV ([place])    stage IV. previous HD, none currently 10/30/15 (confirmed 02/05/2017 & 03/10/2017)  . Depression    Chronic  . Diabetic peripheral neuropathy ([place])   . Dysrhythmia    tachycardia, normal ECHO 08-09-14  . GERD (gastroesophageal reflux disease)   . Heart murmur     dx'd 02/05/2017  . Hepatitis C    "tx'd in 2016; I'm negative now" (02/05/2017)  . History of blood transfusion 2017   "w/knee replacement"  . Hyperlipidemia   . Hypertension   . Osteoarthritis of left knee   .  Peripheral neuropathy   . Protein calorie malnutrition ([place])   . Septic arthritis ([place])   . Slow transit constipation   . Symptomatic anemia 01/27/2020  . Type II diabetes mellitus ([name])    IDDM  . Uncontrolled hypertension 02/18/2013  . Unsteady gait     Past Surgical History:  Procedure Laterality Date  . A/V FISTULAGRAM Right 07/07/2018   Procedure: A/V FISTULAGRAM;  Surgeon: Marty Heck, MD;  Location: [facility];  Service: Cardiovascular;   Laterality: Right;  . AMPUTATION Left 03/20/2017   Procedure: LEFT ABOVE KNEE AMPUTATION;  Surgeon: Newt Minion, MD;  Location: [facility];  Service: Orthopedics;  Laterality: Left;  . APPENDECTOMY    . AV FISTULA PLACEMENT Left 09/13/2014   Procedure: Brachial Artery to Brachial Vein Gortex Four - Seven Stretch GRAFT INSERTION Left Forearm;  Surgeon: Mal Misty, MD;  Location: [facility];  Service: Vascular;  Laterality: Left;  . BASCILIC VEIN TRANSPOSITION Right 10/26/2017   Procedure: BASILIC VEIN TRANSPOSITION FIRST STAGE RIGHT ARM;  Surgeon: Conrad , MD;  Location: [facility];  Service: Vascular;  Laterality: Right;  . BASCILIC VEIN TRANSPOSITION Right 02/01/2018   Procedure: SECOND STAGE BASILIC VEIN TRANSPOSITION RIGHT UPPER EXTREMITY;  Surgeon: Marty Heck, MD;  Location: [facility];  Service: Vascular;  Laterality: Right;  . CHOLECYSTECTOMY OPEN    . COLON SURGERY    . COLONOSCOPY    . EXCISIONAL TOTAL KNEE ARTHROPLASTY WITH ANTIBIOTIC SPACERS Left 02/06/2017   Procedure: Incisional total iknee with antibiotic spacer ;  Surgeon: Leandrew Koyanagi, MD;  Location: [facility];  Service: Orthopedics;  Laterality: Left;  . IR FLUORO GUIDE CV LINE RIGHT  02/10/2017  . IR REMOVAL TUN CV CATH W/O FL  04/01/2017  . IR US GUIDE VASC ACCESS RIGHT  02/10/2017  . IRRIGATION AND DEBRIDEMENT KNEE Left 03/12/2017   Procedure: IRRIGATION AND DEBRIDEMENT LEFT KNEE WITH WOUND VAC APPLICATION;  Surgeon: Leandrew Koyanagi, MD;  Location: [facility];  Service: Orthopedics;  Laterality: Left;  . JOINT REPLACEMENT    . KNEE ARTHROSCOPY Right 08/10/2014   Procedure: ARTHROSCOPY I & D KNEE;  Surgeon: Marianna Payment, MD;  [facility];  Service: Orthopedics;  Laterality: Right;  . KNEE ARTHROSCOPY Left 08/11/2014   Procedure: ARTHROSCOPIC WASHOUT LEFT KNEE;  Surgeon: Marianna Payment, MD;  Location: [facility];  Service: Orthopedics;  Laterality: Left;  . KNEE ARTHROSCOPY Left 08/19/2014   Procedure: ARTHROSCOPIC WASHOUT LEFT  KNEE;  Surgeon: Leandrew Koyanagi, MD;  Location: [facility];  Service: Orthopedics;  Laterality: Left;  . KNEE ARTHROSCOPY WITH LATERAL MENISECTOMY Left 04/04/2015   Procedure: AND PARTIAL LATERAL MENISECTOMY;  Surgeon: Leandrew Koyanagi, MD;  Location: [facility];  Service: Orthopedics;  Laterality: Left;  . KNEE ARTHROSCOPY WITH MEDIAL MENISECTOMY Left 04/04/2015   Procedure: LEFT KNEE ARTHROSCOPY WITH PARTIAL MEDIAL MENISCECTOMY  AND SYNOVECTOMY;  Surgeon: Leandrew Koyanagi, MD;  Location: [facility];  Service: Orthopedics;  Laterality: Left;  . PERIPHERAL VASCULAR BALLOON ANGIOPLASTY  07/07/2018   Procedure: PERIPHERAL VASCULAR BALLOON ANGIOPLASTY;  Surgeon: Marty Heck, MD;  Location: [facility];  Service: Cardiovascular;;  right AV fistula  . POLYPECTOMY    . SHOULDER ARTHROSCOPY Bilateral 08/10/2014   Procedure: I & D BILATERAL SHOULDERS ;  Surgeon: Marianna Payment, MD;  [facility];  Service: Orthopedics;  Laterality: Bilateral;  . SMALL INTESTINE SURGERY     Due to Small  Bowel Obstruction; "fixed it when they did my gallbladder OR"  . TEE WITHOUT CARDIOVERSION N/A 08/14/2014   Procedure: TRANSESOPHAGEAL ECHOCARDIOGRAM (TEE);  Surgeon: Thayer Headings, MD;  Location: [facility];  Service: Cardiovascular;  Laterality: N/A;  . TENOSYNOVECTOMY Right 08/11/2014   Procedure: RIGHT WRIST IRRIGATION AND DEBRIDEMENT, TENOSYNOVECTOMY;  Surgeon: Marianna Payment, MD;  Location: [facility];  Service: Orthopedics;  Laterality: Right;  . TOTAL KNEE ARTHROPLASTY Left 11/07/2015   Procedure: LEFT TOTAL KNEE ARTHROPLASTY WITH REVISION OF IMPLANTS;  Surgeon: Leandrew Koyanagi, MD;  Location: [facility];  Service: Orthopedics;  Laterality: Left;  . TUBAL LIGATION      Current Medications: No outpatient medications have been marked as taking for the 04/04/20 encounter (Office Visit) with Donato Heinz, MD.     Allergies:   Compazine [prochlorperazine], Shellfish-derived  products, Iodinated diagnostic agents, Omnipaque [iohexol], Tape, and Sulfa antibiotics   Social History   Socioeconomic History  . Marital status: Single    Spouse name: Not on file  . Number of children: 3  . Years of education: Not on file  . Highest education level: Not on file  Occupational History  . Occupation: disabled  Tobacco Use  . Smoking status: Current Every Day Smoker    Packs/day: 0.50    Years: 35.00    Pack years: 17.50    Types: Cigarettes  . Smokeless tobacco: Never Used  Vaping Use  . Vaping Use: Never used  Substance and Sexual Activity  . Alcohol use: Not Currently    Alcohol/week: 0.0 standard drinks    Comment: 03/10/2017 "I drink a wine cooler a few times/year"  . Drug use: No  . Sexual activity: Not Currently  Other Topics Concern  . Not on file  Social History Narrative  . Not on file   Social Determinants of Health   Financial Resource Strain:   . Difficulty of Paying Living Expenses: Not on file  Food Insecurity:   . Worried About Charity fundraiser in the Last Year: Not on file  . Ran Out of Food in the Last Year: Not on file  Transportation Needs:   . Lack of Transportation (Medical): Not on file  . Lack of Transportation (Non-Medical): Not on file  Physical Activity:   . Days of Exercise per Week: Not on file  . Minutes of Exercise per Session: Not on file  Stress:   . Feeling of Stress : Not on file  Social Connections:   . Frequency of Communication with Friends and Family: Not on file  . Frequency of Social Gatherings with Friends and Family: Not on file  . Attends Religious Services: Not on file  . Active Member of Clubs or Organizations: Not on file  . Attends Archivist Meetings: Not on file  . Marital Status: Not on file     Family History: [name] family history includes Cancer in her brother; Diabetes in her brother; Heart disease in her father; Hypertension in her brother and sister; Uterine cancer in  her mother and sister. There is no history of Colon cancer, Colon polyps, Esophageal cancer, Rectal cancer, or Stomach cancer.  ROS:   Please see the history of present illness.    All other systems reviewed and are negative.  EKGs/Labs/Other Studies Reviewed:    The following studies were reviewed today:   EKG:  EKG is ordered today.  The ekg ordered today demonstrates normal sinus rhythm, rate 80, Q waves in V1-3, <79mm  ST depression II, aVF  Recent Labs: 06/05/2019: Magnesium 2.7 01/26/2020: ALT 23 01/28/2020: BUN 25; Creatinine, Ser 4.95; Hemoglobin 8.3; Platelets 186; Potassium 3.3; Sodium 138  Recent Lipid Panel    Component Value Date/Time   CHOL 111 12/10/2018 1133   TRIG 82.0 12/10/2018 1133   HDL 61.40 12/10/2018 1133   CHOLHDL 2 12/10/2018 1133   VLDL 16.4 12/10/2018 1133   LDLCALC 33 12/10/2018 1133    [place] 03/3019:  Nuclear stress EF: 51%. No wall motion abnormalities. Low normal ejection fraction.  There was no ST segment deviation noted during stress.  Defect 1: There is a small fixed defect of mild severity at the apex. No evidence of ischemia identified.  This is a low risk study. No ischemia identified.  TTE 04/13/19:   1. The AoV is tricuspid and moderately calcified. Mild aortic stenosis is  present with Vmax 2.9 mmHg and mean gradient 18 mmHg. Moderate aortic  regurgitation is present with PHT 352 msec. The aortic root is normal in  size. This appears to be  degenerative calcific AoV disease.  2. Left ventricular ejection fraction, by visual estimation, is 60 to  65%. The left ventricle has normal function. There is moderately increased  left ventricular hypertrophy.  3. Elevated left atrial pressure.  4. Left ventricular diastolic parameters are consistent with Grade II  diastolic dysfunction (pseudonormalization).  5. The left ventricle has no regional wall motion abnormalities.  6. Global right ventricle has normal systolic  function.The right  ventricular size is normal. No increase in right ventricular wall  thickness.  7. Trivial pericardial effusion is present.  8. Mild calcification of the anterior mitral valve leaflet(s).  9. Mild mitral annular calcification.  10. Mild thickening of the anterior and posterior mitral valve leaflet(s).  11. The mitral valve is degenerative. Trace mitral valve regurgitation.  12. The tricuspid valve is grossly normal. Tricuspid valve regurgitation  is trivial.  13. Aortic valve regurgitation is moderate.  14. The aortic valve is tricuspid. Aortic valve regurgitation is moderate.  Mild aortic valve stenosis.  15. There is Moderate calcification of the aortic valve.  16. There is Moderate thickening of the aortic valve.  17. The pulmonic valve was grossly normal. Pulmonic valve regurgitation is  trivial.  18. Mild plaque invoving the ascending aorta.  19. TR signal is inadequate for assessing pulmonary artery systolic  pressure.  20. The inferior vena cava is normal in size with greater than 50%  respiratory variability, suggesting right atrial pressure of 3 mmHg.   Physical Exam:    VS:  BP 136/70   Ht 5\' 7"  (1.702 m)   Wt 140 lb 9.6 oz (63.8 kg)   LMP 03/16/2002   SpO2 99%   BMI 22.02 kg/m     Wt Readings from Last 3 Encounters:  04/04/20 140 lb 9.6 oz (63.8 kg)  02/08/20 144 lb (65.3 kg)  01/28/20 133 lb 2.5 oz (60.4 kg)     GEN: in no acute distress HEENT: Normal NECK: No JVD; No carotid bruits LYMPHATICS: No lymphadenopathy CARDIAC: RRR, 3/6 systolic murmur RESPIRATORY:  Clear to auscultation without rales, wheezing or rhonchi  ABDOMEN: Soft, non-tender, non-distended MUSCULOSKELETAL:  No edema. S/p L AKA SKIN: Warm and dry NEUROLOGIC:  Alert and oriented x 3 PSYCHIATRIC:  Normal affect   ASSESSMENT:    1. Acute combined systolic and diastolic heart failure ([place])   2. GAVE (gastric antral vascular ectasia)   3. Aortic valve insufficiency,  etiology of  cardiac valve disease unspecified   4. Tricuspid valve insufficiency, unspecified etiology   5. Palpitations   6. Essential hypertension    PLAN:    Acute combined systolic and diastolic heart failure:  Lexiscan Myoview was done for chest pain on 04/15/2019, which showed small fixed defect at apex, otherwise no abnormalities.  TTE on 04/13/2019 showed mild aortic stenosis, moderate aortic regurgitation, normal EF, grade 2 diastolic dysfunction, normal RV function.Repeat echo on 03/28/2020 was done to follow-up her aortic regurgitation, which showed new LV systolic dysfunction (EF 30 to 35%, global hypokinesis), moderate LVH, grade 2 diastolic dysfunction, normal RV function, moderate RV enlargement, severe elevation in RVSP (74 mmHg), moderate pericardial effusion, moderate biatrial dilatation, mild to moderate mitral regurgitation, severe tricuspid regurgitation, moderate aortic regurgitation. -Recommend LHC/RHC for further evaluation, however this is complicated by recent GI bleed.  Had planned APC for GAVE next month.  Will check CBC and discuss with Dr Silverio Decamp.  If no further bleeding, can proceed with cath. -Add carvedilol 3.125 mg BID  Palpitations: Zio patch x7 days on 02/01/2020 showed one 4 beat run of NSVT, otherwise unremarkable.  Aortic regurgitation: Moderate on TTE in 03/2019.  Stable on repeat echo on 03/2020  Tricuspid regurgitation: Severe on echo 03/2020.  Plan cath as above.  Hypertension: On amlodipine 10 mg daily, hydralazine 100 mg 3 times daily on non-HD days, clonidine 0.2 mg twice daily.  Add carvedilol as above  Type 2 diabetes: On insulin.  Last A1c 6.3 on 02/28/2019  HLD: On atorvastatin 10 mg daily.  Last LDL 33 on 12/10/2018  ESRD: On HD  RTC in 1 month  Medication Adjustments/Labs and Tests Ordered: Current medicines are reviewed at length with the patient today.  Concerns regarding medicines are outlined above.  Orders Placed This Encounter    Procedures  . Basic metabolic panel  . CBC  . EKG 12-Lead   Meds ordered this encounter  Medications  . carvedilol (COREG) 3.125 MG tablet    Sig: Take 1 tablet (3.125 mg total) by mouth 2 (two) times daily.    Dispense:  180 tablet    Refill:  3    Patient Instructions     [place] [address] Dept: [phone] Loc: [phone]  CLORINE SWING  04/04/2020  You are scheduled for a Cardiac Catheterization on __________  with Dr. ________.  1. Please arrive at the [facility] (Main Entrance A) at [facility]: [address] [place], [place] [address] at ______ (This time is two hours before your procedure to ensure your preparation). Free valet parking service is available.   Special note: Every effort is made to have your procedure done on time. Please understand that emergencies sometimes delay scheduled procedures.  2. Diet: Do not eat solid foods after midnight.  The patient may have clear liquids until 5am upon the day of the procedure.  3. Labs: Today  COVID TEST:  We will schedule this for you once procedure is scheduled [place], [place]  4. Medication instructions in preparation for your procedure:   Contrast Allergy: Yes, Please take Prednisone 50mg  by mouth at: Thirteen hours prior to cath  Seven hours prior to cath  And prior to leaving home please take last dose of Prednisone 50mg  and Benadryl 50mg  by mouth.  On the morning of your procedure, take your Aspirin and any morning medicines NOT listed above.  You may use sips of water.  5. Plan for one night stay--bring personal belongings. 6. Bring a current list of your medications and current insurance cards. 7. You MUST have a responsible person to drive you home. 8. Someone MUST be with you the first 24 hours after you arrive home or your discharge will be  delayed. 9. Please wear clothes that are easy to get on and off and wear slip-on shoes.  Thank you for allowing [place] to care for you!   -- [facility]     START carvedilol (Coreg) 3.125 mg two times daily  Follow up in 1 month with Dr. Gardiner Rhyme    Signed, Donato Heinz, MD  04/04/2020 10:41 PM    [place]

## 2020-04-03 DIAGNOSIS — N2581 Secondary hyperparathyroidism of renal origin: Secondary | ICD-10-CM | POA: Diagnosis not present

## 2020-04-03 DIAGNOSIS — D631 Anemia in chronic kidney disease: Secondary | ICD-10-CM | POA: Diagnosis not present

## 2020-04-03 DIAGNOSIS — N186 End stage renal disease: Secondary | ICD-10-CM | POA: Diagnosis not present

## 2020-04-03 DIAGNOSIS — Z992 Dependence on renal dialysis: Secondary | ICD-10-CM | POA: Diagnosis not present

## 2020-04-03 DIAGNOSIS — D509 Iron deficiency anemia, unspecified: Secondary | ICD-10-CM | POA: Diagnosis not present

## 2020-04-03 DIAGNOSIS — E1129 Type 2 diabetes mellitus with other diabetic kidney complication: Secondary | ICD-10-CM | POA: Diagnosis not present

## 2020-04-04 ENCOUNTER — Ambulatory Visit (INDEPENDENT_AMBULATORY_CARE_PROVIDER_SITE_OTHER): Payer: Medicare Other | Admitting: Cardiology

## 2020-04-04 VITALS — BP 136/70 | Ht 67.0 in | Wt 140.6 lb

## 2020-04-04 DIAGNOSIS — R002 Palpitations: Secondary | ICD-10-CM

## 2020-04-04 DIAGNOSIS — I10 Essential (primary) hypertension: Secondary | ICD-10-CM | POA: Diagnosis not present

## 2020-04-04 DIAGNOSIS — I351 Nonrheumatic aortic (valve) insufficiency: Secondary | ICD-10-CM | POA: Diagnosis not present

## 2020-04-04 DIAGNOSIS — I5041 Acute combined systolic (congestive) and diastolic (congestive) heart failure: Secondary | ICD-10-CM | POA: Diagnosis not present

## 2020-04-04 DIAGNOSIS — K31819 Angiodysplasia of stomach and duodenum without bleeding: Secondary | ICD-10-CM | POA: Diagnosis not present

## 2020-04-04 DIAGNOSIS — I071 Rheumatic tricuspid insufficiency: Secondary | ICD-10-CM | POA: Diagnosis not present

## 2020-04-04 MED ORDER — CARVEDILOL 3.125 MG PO TABS: 3.1250 mg | ORAL_TABLET | Freq: Two times a day (BID) | ORAL | 3 refills | Status: DC

## 2020-04-04 NOTE — Telephone Encounter (Signed)
[facility] should be refaxing forms today, I have yet to receive anything they have sent me

## 2020-04-04 NOTE — Patient Instructions (Addendum)
    [place] [address] Dept: [phone] Loc: [place]  04/04/2020  You are scheduled for a Cardiac Catheterization on __________  with Dr. ________.  1. Please arrive at the [facility] (Main Entrance A) at [facility]: [address] [place], [place] [address] at ______ (This time is two hours before your procedure to ensure your preparation). Free valet parking service is available.   Special note: Every effort is made to have your procedure done on time. Please understand that emergencies sometimes delay scheduled procedures.  2. Diet: Do not eat solid foods after midnight.  The patient may have clear liquids until 5am upon the day of the procedure.  3. Labs: Today  COVID TEST:  We will schedule this for you once procedure is scheduled [place], [place]  4. Medication instructions in preparation for your procedure:   Contrast Allergy: Yes, Please take Prednisone 50mg  by mouth at: Thirteen hours prior to cath  Seven hours prior to cath  And prior to leaving home please take last dose of Prednisone 50mg  and Benadryl 50mg  by mouth.  On the morning of your procedure, take your Aspirin and any morning medicines NOT listed above.  You may use sips of water.  5. Plan for one night stay--bring personal belongings. 6. Bring a current list of your medications and current insurance cards. 7. You MUST have a responsible person to drive you home. 8. Someone MUST be with you the first 24 hours after you arrive home or your discharge will be delayed. 9. Please wear clothes that are easy to get on and off and wear slip-on shoes.  Thank you for allowing [place] to care for you!   -- [facility]     START carvedilol (Coreg) 3.125 mg two times daily  Follow up in 1 month with Dr. Gardiner Rhyme

## 2020-04-05 DIAGNOSIS — N186 End stage renal disease: Secondary | ICD-10-CM | POA: Diagnosis not present

## 2020-04-05 DIAGNOSIS — Z992 Dependence on renal dialysis: Secondary | ICD-10-CM | POA: Diagnosis not present

## 2020-04-05 DIAGNOSIS — E1129 Type 2 diabetes mellitus with other diabetic kidney complication: Secondary | ICD-10-CM | POA: Diagnosis not present

## 2020-04-05 DIAGNOSIS — D509 Iron deficiency anemia, unspecified: Secondary | ICD-10-CM | POA: Diagnosis not present

## 2020-04-05 DIAGNOSIS — D631 Anemia in chronic kidney disease: Secondary | ICD-10-CM | POA: Diagnosis not present

## 2020-04-05 DIAGNOSIS — N2581 Secondary hyperparathyroidism of renal origin: Secondary | ICD-10-CM | POA: Diagnosis not present

## 2020-04-05 LAB — BASIC METABOLIC PANEL
BUN/Creatinine Ratio: 8 — ABNORMAL LOW (ref 12–28)
BUN: 31 mg/dL — ABNORMAL HIGH (ref 8–27)
CO2: 25 mmol/L (ref 20–29)
Calcium: 9.7 mg/dL (ref 8.7–10.3)
Chloride: 94 mmol/L — ABNORMAL LOW (ref 96–106)
Creatinine, Ser: 3.95 mg/dL — ABNORMAL HIGH (ref 0.57–1.00)
GFR calc Af Amer: 13 mL/min/{1.73_m2} — ABNORMAL LOW (ref >59)
GFR calc non Af Amer: 11 mL/min/{1.73_m2} — ABNORMAL LOW (ref >59)
Glucose: 84 mg/dL (ref 65–99)
Potassium: 4.2 mmol/L (ref 3.5–5.2)
Sodium: 136 mmol/L (ref 134–144)

## 2020-04-05 LAB — CBC
Hematocrit: 25.5 % — ABNORMAL LOW (ref 34.0–46.6)
Hemoglobin: 7.8 g/dL — ABNORMAL LOW (ref 11.1–15.9)
MCH: 29 pg (ref 26.6–33.0)
MCHC: 30.6 g/dL — ABNORMAL LOW (ref 31.5–35.7)
MCV: 95 fL (ref 79–97)
Platelets: 230 10*3/uL (ref 150–450)
RBC: 2.69 x10E6/uL — CL (ref 3.77–5.28)
RDW: 15.6 % — ABNORMAL HIGH (ref 11.7–15.4)
WBC: 5.1 10*3/uL (ref 3.4–10.8)

## 2020-04-06 ENCOUNTER — Other Ambulatory Visit: Payer: Self-pay | Admitting: *Deleted

## 2020-04-06 ENCOUNTER — Telehealth: Payer: Self-pay | Admitting: *Deleted

## 2020-04-06 DIAGNOSIS — I5041 Acute combined systolic (congestive) and diastolic (congestive) heart failure: Secondary | ICD-10-CM

## 2020-04-06 MED ORDER — SODIUM CHLORIDE 0.9% FLUSH: 3.0000 mL | Freq: Two times a day (BID) | INTRAVENOUS | Status: DC

## 2020-04-06 MED ORDER — PREDNISONE 50 MG PO TABS: ORAL_TABLET | ORAL | 0 refills | Status: DC

## 2020-04-06 NOTE — Telephone Encounter (Addendum)
Right and left heart cath scheduled Friday 10/29 with Dr. Angelena Form Covid test Wednesday 10/27 at 1:30 pm Allergic to contrast-rx sent to pharmacy  Called patient to review instructions again (provided at [place]).   Aware of plan and verbalized understanding.  No further questions at this time.  Advised to call with any additional questions or concerns.       [place] [address] Dept: [phone] Loc: [phone]  COLLEENA KURTENBACH  04/06/2020  You are scheduled for a Cardiac Catheterization on Friday, October 29 with Dr. Lauree Chandler.  1. Please arrive at the [facility] (Main Entrance A) at [facility]: [address] [place], [place] [address] at 7:00 AM (This time is two hours before your procedure to ensure your preparation). Free valet parking service is available.   Special note: Every effort is made to have your procedure done on time. Please understand that emergencies sometimes delay scheduled procedures.  2. Diet: Do not eat solid foods after midnight.  The patient may have clear liquids until 5am upon the day of the procedure.  3. Labs: Completed 10/21  Covid test: Wednesday, October 27th at 1:30 PM [place]  4. Medication instructions in preparation for your procedure:   Contrast Allergy: Yes, Please take Prednisone 50mg  by mouth at: Thirteen hours prior to cath 8:00pm on Thursday Seven hours prior to cath 2:00am on Friday And prior to leaving home please take last dose of Prednisone 50mg  and Benadryl 50mg  by mouth.  On the morning of your procedure, take your Aspirin and any morning medicines NOT listed above.  You may use sips of water.  5. Plan for one night stay--bring personal belongings. 6. Bring a current list of your medications and current insurance cards. 7. You MUST have a responsible person to drive  you home. 8. Someone MUST be with you the first 24 hours after you arrive home or your discharge will be delayed. 9. Please wear clothes that are easy to get on and off and wear slip-on shoes.  Thank you for allowing [place] to care for you!   -- [facility] [facility]

## 2020-04-07 DIAGNOSIS — D509 Iron deficiency anemia, unspecified: Secondary | ICD-10-CM | POA: Diagnosis not present

## 2020-04-07 DIAGNOSIS — E1129 Type 2 diabetes mellitus with other diabetic kidney complication: Secondary | ICD-10-CM | POA: Diagnosis not present

## 2020-04-07 DIAGNOSIS — N2581 Secondary hyperparathyroidism of renal origin: Secondary | ICD-10-CM | POA: Diagnosis not present

## 2020-04-07 DIAGNOSIS — D631 Anemia in chronic kidney disease: Secondary | ICD-10-CM | POA: Diagnosis not present

## 2020-04-07 DIAGNOSIS — N186 End stage renal disease: Secondary | ICD-10-CM | POA: Diagnosis not present

## 2020-04-07 DIAGNOSIS — Z992 Dependence on renal dialysis: Secondary | ICD-10-CM | POA: Diagnosis not present

## 2020-04-10 DIAGNOSIS — Z992 Dependence on renal dialysis: Secondary | ICD-10-CM | POA: Diagnosis not present

## 2020-04-10 DIAGNOSIS — E1129 Type 2 diabetes mellitus with other diabetic kidney complication: Secondary | ICD-10-CM | POA: Diagnosis not present

## 2020-04-10 DIAGNOSIS — N186 End stage renal disease: Secondary | ICD-10-CM | POA: Diagnosis not present

## 2020-04-10 DIAGNOSIS — D631 Anemia in chronic kidney disease: Secondary | ICD-10-CM | POA: Diagnosis not present

## 2020-04-10 DIAGNOSIS — D509 Iron deficiency anemia, unspecified: Secondary | ICD-10-CM | POA: Diagnosis not present

## 2020-04-10 DIAGNOSIS — N2581 Secondary hyperparathyroidism of renal origin: Secondary | ICD-10-CM | POA: Diagnosis not present

## 2020-04-11 ENCOUNTER — Other Ambulatory Visit (HOSPITAL_COMMUNITY)
Admission: RE | Admit: 2020-04-11 | Discharge: 2020-04-11 | Disposition: A | Discharge status: Home / Self Care | Payer: Medicare Other | Source: Ambulatory Visit | Attending: Cardiovascular Disease | Admitting: Cardiovascular Disease

## 2020-04-11 DIAGNOSIS — Z20822 Contact with and (suspected) exposure to covid-19: Secondary | ICD-10-CM | POA: Insufficient documentation

## 2020-04-11 DIAGNOSIS — Z01812 Encounter for preprocedural laboratory examination: Secondary | ICD-10-CM | POA: Insufficient documentation

## 2020-04-11 LAB — SARS CORONAVIRUS 2 (TAT 6-24 HRS): SARS Coronavirus 2: NEGATIVE

## 2020-04-12 ENCOUNTER — Telehealth: Payer: Self-pay | Admitting: *Deleted

## 2020-04-12 DIAGNOSIS — Z992 Dependence on renal dialysis: Secondary | ICD-10-CM | POA: Diagnosis not present

## 2020-04-12 DIAGNOSIS — N2581 Secondary hyperparathyroidism of renal origin: Secondary | ICD-10-CM | POA: Diagnosis not present

## 2020-04-12 DIAGNOSIS — D631 Anemia in chronic kidney disease: Secondary | ICD-10-CM | POA: Diagnosis not present

## 2020-04-12 DIAGNOSIS — E1129 Type 2 diabetes mellitus with other diabetic kidney complication: Secondary | ICD-10-CM | POA: Diagnosis not present

## 2020-04-12 DIAGNOSIS — N186 End stage renal disease: Secondary | ICD-10-CM | POA: Diagnosis not present

## 2020-04-12 DIAGNOSIS — D509 Iron deficiency anemia, unspecified: Secondary | ICD-10-CM | POA: Diagnosis not present

## 2020-04-12 NOTE — Telephone Encounter (Signed)
Pt contacted pre-catheterization scheduled at [facility] for: Friday April 13, 2020 9 AM Verified arrival time and place: [place] [facility]) at: 7 AM   No solid food after midnight prior to cath, clear liquids until 5 AM day of procedure.  Contrast allergy- 13 hour Prednisone and Benadryl Prep reviewed with patient: 04/12/20 Prednisone 50 mg 8 PM 04/13/20 Prednisone 50 mg 2 AM 04/13/20 Prednisone 50 mg and Benadryl 50 mg-just prior to leaving home AM of procedure Pt advised not to drive  AM meds can be  taken pre-cath with sips of water including: ASA 81 mg Prednisone 50 mg Benadryl 50 mg  Confirmed patient has responsible adult to drive home post procedure and be with patient first 24 hours after arriving home: yes  You are allowed ONE visitor in the waiting room during the time you are at the hospital for your procedure. Both you and your visitor must wear a mask once you enter the hospital.       COVID-19 Pre-Screening Questions:  . In the past 14 days have you had a new cough, new headache, new nasal congestion, fever (100.4 or greater) unexplained body aches, new sore throat, or sudden loss of taste or sense of smell? no . In the past 14 days have you been around anyone with known Covid 19? no . Have you been vaccinated for COVID-19? Yes, see immunization history   Reviewed procedure/mask/visitor instructions, COVID-19 questions with patient.

## 2020-04-13 ENCOUNTER — Inpatient Hospital Stay (HOSPITAL_COMMUNITY)
Admission: RE | Admit: 2020-04-13 | Discharge: 2020-04-16 | DRG: 286 | Disposition: A | Discharge status: Home / Self Care | Payer: Medicare Other | Attending: Cardiovascular Disease | Admitting: Cardiovascular Disease

## 2020-04-13 ENCOUNTER — Other Ambulatory Visit: Payer: Self-pay

## 2020-04-13 ENCOUNTER — Other Ambulatory Visit: Payer: Self-pay | Admitting: *Deleted

## 2020-04-13 ENCOUNTER — Inpatient Hospital Stay (HOSPITAL_COMMUNITY): Payer: Medicare Other

## 2020-04-13 ENCOUNTER — Encounter (HOSPITAL_COMMUNITY)
Admission: RE | Disposition: A | Discharge status: Home / Self Care | Payer: Self-pay | Source: Home / Self Care | Attending: Cardiovascular Disease

## 2020-04-13 DIAGNOSIS — I25110 Atherosclerotic heart disease of native coronary artery with unstable angina pectoris: Principal | ICD-10-CM | POA: Diagnosis present

## 2020-04-13 DIAGNOSIS — I361 Nonrheumatic tricuspid (valve) insufficiency: Secondary | ICD-10-CM | POA: Diagnosis not present

## 2020-04-13 DIAGNOSIS — I350 Nonrheumatic aortic (valve) stenosis: Secondary | ICD-10-CM | POA: Diagnosis not present

## 2020-04-13 DIAGNOSIS — E1142 Type 2 diabetes mellitus with diabetic polyneuropathy: Secondary | ICD-10-CM | POA: Diagnosis present

## 2020-04-13 DIAGNOSIS — I083 Combined rheumatic disorders of mitral, aortic and tricuspid valves: Secondary | ICD-10-CM | POA: Diagnosis present

## 2020-04-13 DIAGNOSIS — Z96652 Presence of left artificial knee joint: Secondary | ICD-10-CM | POA: Diagnosis present

## 2020-04-13 DIAGNOSIS — Z20822 Contact with and (suspected) exposure to covid-19: Secondary | ICD-10-CM | POA: Diagnosis present

## 2020-04-13 DIAGNOSIS — I5041 Acute combined systolic (congestive) and diastolic (congestive) heart failure: Secondary | ICD-10-CM | POA: Diagnosis not present

## 2020-04-13 DIAGNOSIS — F17210 Nicotine dependence, cigarettes, uncomplicated: Secondary | ICD-10-CM | POA: Diagnosis present

## 2020-04-13 DIAGNOSIS — F339 Major depressive disorder, recurrent, unspecified: Secondary | ICD-10-CM | POA: Diagnosis present

## 2020-04-13 DIAGNOSIS — Z882 Allergy status to sulfonamides status: Secondary | ICD-10-CM

## 2020-04-13 DIAGNOSIS — N250 Renal osteodystrophy: Secondary | ICD-10-CM | POA: Diagnosis not present

## 2020-04-13 DIAGNOSIS — I2510 Atherosclerotic heart disease of native coronary artery without angina pectoris: Secondary | ICD-10-CM | POA: Diagnosis not present

## 2020-04-13 DIAGNOSIS — Z89612 Acquired absence of left leg above knee: Secondary | ICD-10-CM | POA: Diagnosis not present

## 2020-04-13 DIAGNOSIS — I351 Nonrheumatic aortic (valve) insufficiency: Secondary | ICD-10-CM | POA: Diagnosis not present

## 2020-04-13 DIAGNOSIS — N189 Chronic kidney disease, unspecified: Secondary | ICD-10-CM | POA: Diagnosis present

## 2020-04-13 DIAGNOSIS — Z992 Dependence on renal dialysis: Secondary | ICD-10-CM | POA: Diagnosis not present

## 2020-04-13 DIAGNOSIS — Z91041 Radiographic dye allergy status: Secondary | ICD-10-CM

## 2020-04-13 DIAGNOSIS — E785 Hyperlipidemia, unspecified: Secondary | ICD-10-CM | POA: Diagnosis present

## 2020-04-13 DIAGNOSIS — Z79899 Other long term (current) drug therapy: Secondary | ICD-10-CM

## 2020-04-13 DIAGNOSIS — E8889 Other specified metabolic disorders: Secondary | ICD-10-CM | POA: Diagnosis present

## 2020-04-13 DIAGNOSIS — N2581 Secondary hyperparathyroidism of renal origin: Secondary | ICD-10-CM | POA: Diagnosis present

## 2020-04-13 DIAGNOSIS — D631 Anemia in chronic kidney disease: Secondary | ICD-10-CM | POA: Diagnosis present

## 2020-04-13 DIAGNOSIS — I255 Ischemic cardiomyopathy: Secondary | ICD-10-CM

## 2020-04-13 DIAGNOSIS — K2100 Gastro-esophageal reflux disease with esophagitis, without bleeding: Secondary | ICD-10-CM | POA: Diagnosis present

## 2020-04-13 DIAGNOSIS — E1129 Type 2 diabetes mellitus with other diabetic kidney complication: Secondary | ICD-10-CM | POA: Diagnosis not present

## 2020-04-13 DIAGNOSIS — Z91013 Allergy to seafood: Secondary | ICD-10-CM

## 2020-04-13 DIAGNOSIS — I272 Pulmonary hypertension, unspecified: Secondary | ICD-10-CM | POA: Diagnosis present

## 2020-04-13 DIAGNOSIS — I200 Unstable angina: Secondary | ICD-10-CM | POA: Diagnosis not present

## 2020-04-13 DIAGNOSIS — I313 Pericardial effusion (noninflammatory): Secondary | ICD-10-CM | POA: Diagnosis present

## 2020-04-13 DIAGNOSIS — E1122 Type 2 diabetes mellitus with diabetic chronic kidney disease: Secondary | ICD-10-CM | POA: Diagnosis present

## 2020-04-13 DIAGNOSIS — I132 Hypertensive heart and chronic kidney disease with heart failure and with stage 5 chronic kidney disease, or end stage renal disease: Secondary | ICD-10-CM | POA: Diagnosis present

## 2020-04-13 DIAGNOSIS — Z9109 Other allergy status, other than to drugs and biological substances: Secondary | ICD-10-CM

## 2020-04-13 DIAGNOSIS — Z8679 Personal history of other diseases of the circulatory system: Secondary | ICD-10-CM | POA: Diagnosis not present

## 2020-04-13 DIAGNOSIS — R011 Cardiac murmur, unspecified: Secondary | ICD-10-CM | POA: Diagnosis present

## 2020-04-13 DIAGNOSIS — D509 Iron deficiency anemia, unspecified: Secondary | ICD-10-CM | POA: Diagnosis present

## 2020-04-13 DIAGNOSIS — Z8619 Personal history of other infectious and parasitic diseases: Secondary | ICD-10-CM

## 2020-04-13 DIAGNOSIS — Z720 Tobacco use: Secondary | ICD-10-CM | POA: Diagnosis present

## 2020-04-13 DIAGNOSIS — N186 End stage renal disease: Secondary | ICD-10-CM | POA: Diagnosis present

## 2020-04-13 DIAGNOSIS — Z888 Allergy status to other drugs, medicaments and biological substances status: Secondary | ICD-10-CM

## 2020-04-13 DIAGNOSIS — Z833 Family history of diabetes mellitus: Secondary | ICD-10-CM

## 2020-04-13 DIAGNOSIS — R079 Chest pain, unspecified: Secondary | ICD-10-CM | POA: Diagnosis not present

## 2020-04-13 DIAGNOSIS — Z7982 Long term (current) use of aspirin: Secondary | ICD-10-CM

## 2020-04-13 DIAGNOSIS — I5043 Acute on chronic combined systolic (congestive) and diastolic (congestive) heart failure: Secondary | ICD-10-CM | POA: Diagnosis present

## 2020-04-13 DIAGNOSIS — Z8249 Family history of ischemic heart disease and other diseases of the circulatory system: Secondary | ICD-10-CM

## 2020-04-13 HISTORY — PX: RIGHT/LEFT HEART CATH AND CORONARY ANGIOGRAPHY: CATH118266

## 2020-04-13 HISTORY — PX: INTRAVASCULAR PRESSURE WIRE/FFR STUDY: CATH118243

## 2020-04-13 LAB — POCT I-STAT EG7
Acid-Base Excess: 7 mmol/L — ABNORMAL HIGH (ref 0.0–2.0)
Bicarbonate: 33.1 mmol/L — ABNORMAL HIGH (ref 20.0–28.0)
Calcium, Ion: 1.15 mmol/L (ref 1.15–1.40)
HCT: 29 % — ABNORMAL LOW (ref 36.0–46.0)
Hemoglobin: 9.9 g/dL — ABNORMAL LOW (ref 12.0–15.0)
O2 Saturation: 64 %
Potassium: 3.5 mmol/L (ref 3.5–5.1)
Sodium: 136 mmol/L (ref 135–145)
TCO2: 35 mmol/L — ABNORMAL HIGH (ref 22–32)
pCO2, Ven: 57.4 mmHg (ref 44.0–60.0)
pH, Ven: 7.369 (ref 7.250–7.430)
pO2, Ven: 35 mmHg (ref 32.0–45.0)

## 2020-04-13 LAB — GLUCOSE, CAPILLARY
Glucose-Capillary: 147 mg/dL — ABNORMAL HIGH (ref 70–99)
Glucose-Capillary: 146 mg/dL — ABNORMAL HIGH (ref 70–99)
Glucose-Capillary: 166 mg/dL — ABNORMAL HIGH (ref 70–99)
Glucose-Capillary: 166 mg/dL — ABNORMAL HIGH (ref 70–99)

## 2020-04-13 LAB — POCT ACTIVATED CLOTTING TIME
Activated Clotting Time: 274 seconds
Activated Clotting Time: 169 seconds
Activated Clotting Time: 186 seconds

## 2020-04-13 LAB — POCT I-STAT 7, (LYTES, BLD GAS, ICA,H+H)
Acid-Base Excess: 6 mmol/L — ABNORMAL HIGH (ref 0.0–2.0)
Bicarbonate: 31.7 mmol/L — ABNORMAL HIGH (ref 20.0–28.0)
Calcium, Ion: 1.08 mmol/L — ABNORMAL LOW (ref 1.15–1.40)
HCT: 27 % — ABNORMAL LOW (ref 36.0–46.0)
Hemoglobin: 9.2 g/dL — ABNORMAL LOW (ref 12.0–15.0)
O2 Saturation: 99 %
Potassium: 3.4 mmol/L — ABNORMAL LOW (ref 3.5–5.1)
Sodium: 136 mmol/L (ref 135–145)
TCO2: 33 mmol/L — ABNORMAL HIGH (ref 22–32)
pCO2 arterial: 53.2 mmHg — ABNORMAL HIGH (ref 32.0–48.0)
pH, Arterial: 7.383 (ref 7.350–7.450)
pO2, Arterial: 145 mmHg — ABNORMAL HIGH (ref 83.0–108.0)

## 2020-04-13 LAB — PULMONARY FUNCTION TEST
FEF 25-75 Pre: 1.22 L/sec
FEF2575-%Pred-Pre: 58 %
FEV1-%Pred-Pre: 61 %
FEV1-Pre: 1.37 L
FEV1FVC-%Pred-Pre: 94 %
FEV6-%Pred-Pre: 64 %
FEV6-Pre: 1.78 L
FEV6FVC-%Pred-Pre: 103 %
FVC-%Pred-Pre: 64 %
FVC-Pre: 1.85 L
Pre FEV1/FVC ratio: 74 %
Pre FEV6/FVC Ratio: 100 %

## 2020-04-13 SURGERY — RIGHT/LEFT HEART CATH AND CORONARY ANGIOGRAPHY
Anesthesia: LOCAL

## 2020-04-13 MED ORDER — MORPHINE SULFATE (PF) 2 MG/ML IV SOLN
INTRAVENOUS | Status: AC
  Filled 2020-04-13: qty 1

## 2020-04-13 MED ORDER — IOHEXOL 350 MG/ML SOLN
INTRAVENOUS | Status: DC | PRN
  Administered 2020-04-13: 100 mL

## 2020-04-13 MED ORDER — SODIUM CHLORIDE 0.9 % IV SOLN: INTRAVENOUS | Status: DC

## 2020-04-13 MED ORDER — LABETALOL HCL 5 MG/ML IV SOLN
INTRAVENOUS | Status: AC
  Filled 2020-04-13: qty 4

## 2020-04-13 MED ORDER — LIDOCAINE HCL (PF) 1 % IJ SOLN
INTRAMUSCULAR | Status: AC
  Filled 2020-04-13: qty 30

## 2020-04-13 MED ORDER — LABETALOL HCL 5 MG/ML IV SOLN
10.0000 mg | INTRAVENOUS | Status: AC | PRN
  Administered 2020-04-13: 10 mg via INTRAVENOUS

## 2020-04-13 MED ORDER — SODIUM CHLORIDE 0.9% FLUSH
3.0000 mL | Freq: Two times a day (BID) | INTRAVENOUS | Status: DC
  Administered 2020-04-13 – 2020-04-15 (×4): 3 mL via INTRAVENOUS

## 2020-04-13 MED ORDER — ATORVASTATIN CALCIUM 10 MG PO TABS
10.0000 mg | ORAL_TABLET | Freq: Every day | ORAL | Status: DC
  Administered 2020-04-13 – 2020-04-15 (×3): 10 mg via ORAL
  Filled 2020-04-13 (×3): qty 1

## 2020-04-13 MED ORDER — CALCIUM ACETATE (PHOS BINDER) 667 MG PO CAPS: 1334.0000 mg | ORAL_CAPSULE | ORAL | Status: DC | PRN

## 2020-04-13 MED ORDER — SERTRALINE HCL 100 MG PO TABS
100.0000 mg | ORAL_TABLET | Freq: Every evening | ORAL | Status: DC
  Administered 2020-04-13 – 2020-04-16 (×4): 100 mg via ORAL
  Filled 2020-04-13 (×4): qty 1

## 2020-04-13 MED ORDER — ASPIRIN EC 81 MG PO TBEC
81.0000 mg | DELAYED_RELEASE_TABLET | Freq: Every day | ORAL | Status: DC
  Administered 2020-04-14 – 2020-04-16 (×3): 81 mg via ORAL
  Filled 2020-04-13 (×3): qty 1

## 2020-04-13 MED ORDER — RENA-VITE PO TABS
1.0000 | ORAL_TABLET | Freq: Every day | ORAL | Status: DC
  Administered 2020-04-13 – 2020-04-16 (×4): 1 via ORAL
  Filled 2020-04-13 (×4): qty 1

## 2020-04-13 MED ORDER — SODIUM CHLORIDE 0.9% FLUSH: 3.0000 mL | INTRAVENOUS | Status: DC | PRN

## 2020-04-13 MED ORDER — HYDRALAZINE HCL 50 MG PO TABS
50.0000 mg | ORAL_TABLET | Freq: Three times a day (TID) | ORAL | Status: DC
  Administered 2020-04-14 – 2020-04-16 (×8): 50 mg via ORAL
  Filled 2020-04-13 (×8): qty 1

## 2020-04-13 MED ORDER — ACETAMINOPHEN 325 MG PO TABS
650.0000 mg | ORAL_TABLET | ORAL | Status: DC | PRN
  Administered 2020-04-14 – 2020-04-16 (×3): 650 mg via ORAL
  Filled 2020-04-13 (×3): qty 2

## 2020-04-13 MED ORDER — CALCIUM ACETATE (PHOS BINDER) 667 MG PO CAPS
1334.0000 mg | ORAL_CAPSULE | Freq: Every day | ORAL | Status: DC
  Administered 2020-04-14 – 2020-04-16 (×3): 1334 mg via ORAL
  Filled 2020-04-13 (×3): qty 2

## 2020-04-13 MED ORDER — CARVEDILOL 3.125 MG PO TABS
3.1250 mg | ORAL_TABLET | Freq: Two times a day (BID) | ORAL | Status: DC
  Administered 2020-04-13 – 2020-04-16 (×7): 3.125 mg via ORAL
  Filled 2020-04-13 (×7): qty 1

## 2020-04-13 MED ORDER — SODIUM CHLORIDE 0.9 % IV SOLN: 250.0000 mL | INTRAVENOUS | Status: DC | PRN

## 2020-04-13 MED ORDER — INSULIN ASPART 100 UNIT/ML ~~LOC~~ SOLN
0.0000 [IU] | Freq: Every day | SUBCUTANEOUS | Status: DC
  Administered 2020-04-14: 2 [IU] via SUBCUTANEOUS

## 2020-04-13 MED ORDER — ASPIRIN 81 MG PO CHEW: 81.0000 mg | CHEWABLE_TABLET | ORAL | Status: DC

## 2020-04-13 MED ORDER — TRAZODONE HCL 50 MG PO TABS
50.0000 mg | ORAL_TABLET | Freq: Every evening | ORAL | Status: DC | PRN
  Administered 2020-04-13 – 2020-04-15 (×3): 50 mg via ORAL
  Filled 2020-04-13 (×3): qty 1

## 2020-04-13 MED ORDER — GABAPENTIN 100 MG PO CAPS
200.0000 mg | ORAL_CAPSULE | ORAL | Status: DC
  Administered 2020-04-13 – 2020-04-16 (×7): 200 mg via ORAL
  Filled 2020-04-13 (×7): qty 2

## 2020-04-13 MED ORDER — SODIUM CHLORIDE 0.9 % IV SOLN
INTRAVENOUS | Status: AC | PRN
  Administered 2020-04-13: 10 mL/h via INTRAVENOUS

## 2020-04-13 MED ORDER — MIDAZOLAM HCL 2 MG/2ML IJ SOLN
INTRAMUSCULAR | Status: AC
  Filled 2020-04-13: qty 2

## 2020-04-13 MED ORDER — FENTANYL CITRATE (PF) 100 MCG/2ML IJ SOLN
INTRAMUSCULAR | Status: AC
  Filled 2020-04-13: qty 2

## 2020-04-13 MED ORDER — POLYETHYLENE GLYCOL 3350 17 G PO PACK: 17.0000 g | PACK | Freq: Every day | ORAL | Status: DC | PRN

## 2020-04-13 MED ORDER — HEPARIN (PORCINE) IN NACL 1000-0.9 UT/500ML-% IV SOLN
INTRAVENOUS | Status: DC | PRN
  Administered 2020-04-13 (×2): 500 mL

## 2020-04-13 MED ORDER — CALCIUM ACETATE (PHOS BINDER) 667 MG PO CAPS: 1334.0000 mg | ORAL_CAPSULE | ORAL | Status: DC

## 2020-04-13 MED ORDER — HYDRALAZINE HCL 20 MG/ML IJ SOLN: 10.0000 mg | INTRAMUSCULAR | Status: AC | PRN

## 2020-04-13 MED ORDER — PANTOPRAZOLE SODIUM 20 MG PO TBEC
20.0000 mg | DELAYED_RELEASE_TABLET | Freq: Two times a day (BID) | ORAL | Status: DC
  Administered 2020-04-13 – 2020-04-16 (×6): 20 mg via ORAL
  Filled 2020-04-13 (×8): qty 1

## 2020-04-13 MED ORDER — FENTANYL CITRATE (PF) 100 MCG/2ML IJ SOLN
INTRAMUSCULAR | Status: DC | PRN
  Administered 2020-04-13 (×2): 25 ug via INTRAVENOUS

## 2020-04-13 MED ORDER — CLONIDINE HCL 0.1 MG PO TABS
0.1000 mg | ORAL_TABLET | Freq: Two times a day (BID) | ORAL | Status: DC
  Administered 2020-04-13 – 2020-04-16 (×6): 0.1 mg via ORAL
  Filled 2020-04-13 (×6): qty 1

## 2020-04-13 MED ORDER — GABAPENTIN 300 MG PO CAPS
300.0000 mg | ORAL_CAPSULE | ORAL | Status: DC
  Administered 2020-04-14 (×2): 300 mg via ORAL
  Filled 2020-04-13 (×4): qty 1

## 2020-04-13 MED ORDER — HEPARIN (PORCINE) IN NACL 1000-0.9 UT/500ML-% IV SOLN
INTRAVENOUS | Status: AC
  Filled 2020-04-13: qty 1000

## 2020-04-13 MED ORDER — HEPARIN SODIUM (PORCINE) 1000 UNIT/ML IJ SOLN
INTRAMUSCULAR | Status: AC
  Filled 2020-04-13: qty 1

## 2020-04-13 MED ORDER — HEPARIN SODIUM (PORCINE) 1000 UNIT/ML IJ SOLN
INTRAMUSCULAR | Status: DC | PRN
  Administered 2020-04-13: 8000 [IU] via INTRAVENOUS

## 2020-04-13 MED ORDER — MIDAZOLAM HCL 2 MG/2ML IJ SOLN
INTRAMUSCULAR | Status: DC | PRN
  Administered 2020-04-13: 1 mg via INTRAVENOUS
  Administered 2020-04-13: 2 mg via INTRAVENOUS

## 2020-04-13 MED ORDER — MORPHINE SULFATE (PF) 2 MG/ML IV SOLN
2.0000 mg | INTRAVENOUS | Status: DC | PRN
  Administered 2020-04-13 (×2): 2 mg via INTRAVENOUS

## 2020-04-13 MED ORDER — LIDOCAINE HCL (PF) 1 % IJ SOLN
INTRAMUSCULAR | Status: DC | PRN
  Administered 2020-04-13: 20 mL

## 2020-04-13 MED ORDER — CALCIUM ACETATE (PHOS BINDER) 667 MG PO CAPS
2001.0000 mg | ORAL_CAPSULE | Freq: Two times a day (BID) | ORAL | Status: DC
  Administered 2020-04-13 – 2020-04-16 (×7): 2001 mg via ORAL
  Filled 2020-04-13 (×7): qty 3

## 2020-04-13 MED ORDER — SODIUM CHLORIDE 0.9% FLUSH
3.0000 mL | Freq: Two times a day (BID) | INTRAVENOUS | Status: DC
  Administered 2020-04-13 – 2020-04-15 (×5): 3 mL via INTRAVENOUS

## 2020-04-13 MED ORDER — METHOCARBAMOL 500 MG PO TABS
500.0000 mg | ORAL_TABLET | Freq: Three times a day (TID) | ORAL | Status: DC | PRN
  Administered 2020-04-13: 500 mg via ORAL
  Filled 2020-04-13 (×3): qty 1

## 2020-04-13 MED ORDER — INSULIN ASPART 100 UNIT/ML ~~LOC~~ SOLN
0.0000 [IU] | Freq: Three times a day (TID) | SUBCUTANEOUS | Status: DC
  Administered 2020-04-13 – 2020-04-15 (×3): 1 [IU] via SUBCUTANEOUS

## 2020-04-13 SURGICAL SUPPLY — 15 items
CATH INFINITI 5FR AL1 (CATHETERS) ×2 IMPLANT
CATH INFINITI 5FR MULTPACK ANG (CATHETERS) ×2 IMPLANT
CATH SWAN GANZ 7F STRAIGHT (CATHETERS) ×2 IMPLANT
CATH VISTA GUIDE 6FR XB3 (CATHETERS) ×2 IMPLANT
GUIDEWIRE PRESSURE COMET II (WIRE) ×2 IMPLANT
KIT ESSENTIALS PG (KITS) ×2 IMPLANT
KIT HEART LEFT (KITS) ×2 IMPLANT
PACK CARDIAC CATHETERIZATION (CUSTOM PROCEDURE TRAY) ×2 IMPLANT
SHEATH PINNACLE 5F 10CM (SHEATH) ×2 IMPLANT
SHEATH PINNACLE 6F 10CM (SHEATH) ×2 IMPLANT
SHEATH PINNACLE 7F 10CM (SHEATH) ×2 IMPLANT
SHEATH PROBE COVER 6X72 (BAG) ×2 IMPLANT
TRANSDUCER W/STOPCOCK (MISCELLANEOUS) ×2 IMPLANT
TUBING CIL FLEX 10 FLL-RA (TUBING) ×2 IMPLANT
WIRE EMERALD 3MM-J .035X150CM (WIRE) ×2 IMPLANT

## 2020-04-13 NOTE — H&P (Signed)
Cardiology Admission History and Physical:   [name] MRN: [id number]; DOB: 09-07-1955   Admission date: 04/13/2020  Primary Care Provider: [name], MD [facility] Cardiologist: [facility] Electrophysiologist:  None   Chief Complaint: Chest pain   History of Present Illness:   Brandy Houston is a 64 yo female with history of ESRD on HD, DM, HTN, anemia, septic arthritis s/p Left AKA, aortic valve insufficiency, tricuspid valve insufficiency, new diagnosis of  ischemic cardiomyopathy and CAD who is being admitted today following diagnostic cardiac cath. She was referred for cath today by Dr. Gardiner Rhyme. Chest pain dating back to June 2020 at which time she had anemia and low risk stress test. Now having chest pain with HD and with moderate exertion. LVEF=30=35% with moderate pericardial effusion, mild to moderate MR, moderate AI, severe TR.   Cardiac cath today with severe disease in the proximal LAD, ostial intermediate branch, proximal Circumflex.   No chest pain today.    Past Medical History:  Diagnosis Date  . Allergy   . Anemia   . Anxiety   . Arthritis    "in my joints" (03/10/2017)  . Chronic diastolic CHF (congestive heart failure) ([place])   . CKD (chronic kidney disease), stage IV ([place])    stage IV. previous HD, none currently 10/30/15 (confirmed 02/05/2017 & 03/10/2017)  . Depression    Chronic  . Diabetic peripheral neuropathy ([place])   . Dysrhythmia    tachycardia, normal ECHO 08-09-14  . GERD (gastroesophageal reflux disease)   . Heart murmur     dx'd 02/05/2017  . Hepatitis C    "tx'd in 2016; I'm negative now" (02/05/2017)  . History of blood transfusion 2017   "w/knee replacement"  . Hyperlipidemia   . Hypertension   . Osteoarthritis of left knee   . Peripheral neuropathy   . Protein calorie malnutrition ([place])   . Septic arthritis ([place])   . Slow transit constipation   . Symptomatic anemia 01/27/2020  . Type II diabetes  mellitus ([name])    IDDM  . Uncontrolled hypertension 02/18/2013  . Unsteady gait     Past Surgical History:  Procedure Laterality Date  . A/V FISTULAGRAM Right 07/07/2018   Procedure: A/V FISTULAGRAM;  Surgeon: Marty Heck, MD;  Location: [place] [facility];  Service: Cardiovascular;  Laterality: Right;  . AMPUTATION Left 03/20/2017   Procedure: LEFT ABOVE KNEE AMPUTATION;  Surgeon: Newt Minion, MD;  Location: [place];  Service: Orthopedics;  Laterality: Left;  . APPENDECTOMY    . AV FISTULA PLACEMENT Left 09/13/2014   Procedure: Brachial Artery to Brachial Vein Gortex Four - Seven Stretch GRAFT INSERTION Left Forearm;  Surgeon: Mal Misty, MD;  Location: [place];  Service: Vascular;  Laterality: Left;  . BASCILIC VEIN TRANSPOSITION Right 10/26/2017   Procedure: BASILIC VEIN TRANSPOSITION FIRST STAGE RIGHT ARM;  Surgeon: Conrad Osmond, MD;  Location: [place];  Service: Vascular;  Laterality: Right;  . BASCILIC VEIN TRANSPOSITION Right 02/01/2018   Procedure: SECOND STAGE BASILIC VEIN TRANSPOSITION RIGHT UPPER EXTREMITY;  Surgeon: Marty Heck, MD;  Location: [place];  Service: Vascular;  Laterality: Right;  . CHOLECYSTECTOMY OPEN    . COLON SURGERY    . COLONOSCOPY    . EXCISIONAL TOTAL KNEE ARTHROPLASTY WITH ANTIBIOTIC SPACERS Left 02/06/2017   Procedure: Incisional total iknee with antibiotic spacer ;  Surgeon: Leandrew Koyanagi, MD;  Location: [place];  Service: Orthopedics;  Laterality: Left;  .  IR FLUORO GUIDE CV LINE RIGHT  02/10/2017  . IR REMOVAL TUN CV CATH W/O FL  04/01/2017  . IR US GUIDE VASC ACCESS RIGHT  02/10/2017  . IRRIGATION AND DEBRIDEMENT KNEE Left 03/12/2017   Procedure: IRRIGATION AND DEBRIDEMENT LEFT KNEE WITH WOUND VAC APPLICATION;  Surgeon: Leandrew Koyanagi, MD;  Location: [place];  Service: Orthopedics;  Laterality: Left;  . JOINT REPLACEMENT    . KNEE ARTHROSCOPY Right 08/10/2014   Procedure: ARTHROSCOPY I & D KNEE;  Surgeon: Marianna Payment, MD;  [facility];  Service: Orthopedics;  Laterality: Right;  . KNEE ARTHROSCOPY Left 08/11/2014   Procedure: ARTHROSCOPIC WASHOUT LEFT KNEE;  Surgeon: Marianna Payment, MD;  Location: [place];  Service: Orthopedics;  Laterality: Left;  . KNEE ARTHROSCOPY Left 08/19/2014   Procedure: ARTHROSCOPIC WASHOUT LEFT KNEE;  Surgeon: Leandrew Koyanagi, MD;  Location: [place];  Service: Orthopedics;  Laterality: Left;  . KNEE ARTHROSCOPY WITH LATERAL MENISECTOMY Left 04/04/2015   Procedure: AND PARTIAL LATERAL MENISECTOMY;  Surgeon: Leandrew Koyanagi, MD;  Location: [place];  Service: Orthopedics;  Laterality: Left;  . KNEE ARTHROSCOPY WITH MEDIAL MENISECTOMY Left 04/04/2015   Procedure: LEFT KNEE ARTHROSCOPY WITH PARTIAL MEDIAL MENISCECTOMY  AND SYNOVECTOMY;  Surgeon: Leandrew Koyanagi, MD;  Location: [place];  Service: Orthopedics;  Laterality: Left;  . PERIPHERAL VASCULAR BALLOON ANGIOPLASTY  07/07/2018   Procedure: PERIPHERAL VASCULAR BALLOON ANGIOPLASTY;  Surgeon: Marty Heck, MD;  Location: [place] [facility];  Service: Cardiovascular;;  right AV fistula  . POLYPECTOMY    . SHOULDER ARTHROSCOPY Bilateral 08/10/2014   Procedure: I & D BILATERAL SHOULDERS ;  Surgeon: Marianna Payment, MD;  Location[facility];  Service: Orthopedics;  Laterality: Bilateral;  . SMALL INTESTINE SURGERY     Due to Small Bowel Obstruction; "fixed it when they did my gallbladder OR"  . TEE WITHOUT CARDIOVERSION N/A 08/14/2014   Procedure: TRANSESOPHAGEAL ECHOCARDIOGRAM (TEE);  Surgeon: Thayer Headings, MD;  Location: [place];  Service: Cardiovascular;  Laterality: N/A;  . TENOSYNOVECTOMY Right 08/11/2014   Procedure: RIGHT WRIST IRRIGATION AND DEBRIDEMENT, TENOSYNOVECTOMY;  Surgeon: Marianna Payment, MD;  Location: [place];  Service: Orthopedics;  Laterality: Right;  . TOTAL KNEE ARTHROPLASTY Left 11/07/2015   Procedure: LEFT TOTAL KNEE ARTHROPLASTY WITH REVISION OF IMPLANTS;  Surgeon: Leandrew Koyanagi, MD;   Location: [place];  Service: Orthopedics;  Laterality: Left;  . TUBAL LIGATION       Medications Prior to Admission: Prior to Admission medications   Medication Sig Start Date End Date Taking? Authorizing Provider  Accu-Chek Softclix Lancets lancets Check sugars three times a day for E11.65 09/20/18  Yes [name], MD  acetaminophen (TYLENOL) 500 MG tablet Take 500-1,000 mg by mouth every 6 (six) hours as needed (for pain.).   Yes [provider]  aspirin EC 81 MG tablet Take 81 mg by mouth daily.   Yes [provider]  atorvastatin (LIPITOR) 10 MG tablet TAKE 1 TABLET BY MOUTH AT BEDTIME . APPOINTMENT REQUIRED FOR FUTURE REFILLS Patient taking differently: Take 10 mg by mouth at bedtime.  10/31/19  Yes [name], MD  blood glucose meter kit and supplies KIT Dispense based on patient and insurance preference. Use up to four times daily as directed 10/13/19  Yes [name], MD  Blood Glucose Monitoring Suppl (ACCU-CHEK GUIDE) w/Device KIT 1 Device by Does not apply route daily. 10/31/19  Yes Jonni Sanger,  Karie Fetch, MD  calcium acetate (PHOSLO) 667 MG capsule Take 1,334-2,001 mg by mouth See admin instructions. Take 3 capsules (2001 mg) by mouth with breakfast, take 2 capsules (1334 mg) by mouth with lunch, take 3 capsules (2001 mg) by mouth with supper & take 2 capsules (1334 mg) by mouth with snacks. 01/13/19  Yes [provider]  carvedilol (COREG) 3.125 MG tablet Take 1 tablet (3.125 mg total) by mouth 2 (two) times daily. 04/04/20 07/03/20 Yes Donato Heinz, MD  cloNIDine (CATAPRES) 0.1 MG tablet Take 0.1 mg by mouth every 12 (twelve) hours. 03/29/20  Yes [provider]  diphenhydrAMINE (BENADRYL) 25 mg capsule Take 25 mg by mouth every 6 (six) hours as needed for allergies.   Yes [provider]  gabapentin (NEURONTIN) 100 MG capsule Take 200 mg by mouth See admin instructions. Take 2 capsules (200 mg) by mouth 3 times on Sundays,  Mondays, Wednesdays, & Fridays. 11/01/18  Yes [provider]  gabapentin (NEURONTIN) 300 MG capsule Take 300 mg by mouth See admin instructions. Take 1 capsule (300 mg) by mouth twice daily on Tuesdays, Thursdays, & Saturdays.   Yes [provider]  glucose blood (ACCU-CHEK GUIDE) test strip Check blood sugar up to 4 times a day 10/20/19  Yes [name], MD  hydrALAZINE (APRESOLINE) 100 MG tablet Take 100 mg by mouth in the morning and at bedtime.    Yes [provider]  Lancet Devices [facility]) lancets Check sugars TID for E11.65 01/29/15  Yes Chari Manning A, NP  lidocaine-prilocaine (EMLA) cream Apply 1 application topically Every Tuesday,Thursday,and Saturday with dialysis. On days of dialysis 3x week 10/25/18  Yes [provider]  methocarbamol (ROBAXIN) 500 MG tablet Take 500 mg by mouth every 8 (eight) hours as needed for muscle spasms.  06/08/19  Yes [provider]  Methoxy PEG-Epoetin Beta (MIRCERA IJ) Get it at dialysis 06/30/19 06/28/20 Yes [provider]  multivitamin (RENA-VIT) TABS tablet Take 1 tablet by mouth daily. 03/01/19  Yes [provider]  pantoprazole (PROTONIX) 20 MG tablet Take 20 mg by mouth 2 (two) times daily. 03/29/20  Yes [provider]  polyethylene glycol (MIRALAX / GLYCOLAX) 17 g packet Take 17 g by mouth daily as needed for moderate constipation. 12/10/18  Yes [name], MD  sertraline (ZOLOFT) 100 MG tablet Take 1 tablet by mouth once daily Patient taking differently: Take 100 mg by mouth every evening.  03/26/20  Yes [name], MD  traZODone (DESYREL) 50 MG tablet Take 50 mg by mouth at bedtime as needed for sleep.  10/27/18  Yes [provider]  nystatin (MYCOSTATIN) 100000 UNIT/ML suspension 200,000 units  Take 2 cc by mouth qid x 5 days Patient not taking: Reported on 04/06/2020 02/08/20   Mauri Pole, MD  predniSONE (DELTASONE) 50 MG tablet Take 1  tablet 13 hours prior to procedure, 1 tablet 7 hours prior to procedure, and 1 tablet (with Benedryl 50 mg) prior to going to the hospital for procedure Patient not taking: Reported on 04/06/2020 04/06/20   Donato Heinz, MD     Allergies:    Allergies  Allergen Reactions  . Compazine [Prochlorperazine] Shortness Of Breath, Swelling and Other (See Comments)    TONGUE SWELLS  . Shellfish-Derived Products Anaphylaxis  . Iodinated Diagnostic Agents Hives and Rash  . Omnipaque [Iohexol] Hives  . Tape     Brown paper tape caused skin irritation  . Sulfa Antibiotics  Rash    Social History:   Social History   Socioeconomic History  . Marital status: Single    Spouse name: Not on file  . Number of children: 3  . Years of education: Not on file  . Highest education level: Not on file  Occupational History  . Occupation: disabled  Tobacco Use  . Smoking status: Current Every Day Smoker    Packs/day: 0.50    Years: 35.00    Pack years: 17.50    Types: Cigarettes  . Smokeless tobacco: Never Used  Vaping Use  . Vaping Use: Never used  Substance and Sexual Activity  . Alcohol use: Not Currently    Alcohol/week: 0.0 standard drinks    Comment: 03/10/2017 "I drink a wine cooler a few times/year"  . Drug use: No  . Sexual activity: Not Currently  Other Topics Concern  . Not on file  Social History Narrative  . Not on file   Social Determinants of Health   Financial Resource Strain:   . Difficulty of Paying Living Expenses: Not on file  Food Insecurity:   . Worried About Charity fundraiser in the Last Year: Not on file  . Ran Out of Food in the Last Year: Not on file  Transportation Needs:   . Lack of Transportation (Medical): Not on file  . Lack of Transportation (Non-Medical): Not on file  Physical Activity:   . Days of Exercise per Week: Not on file  . Minutes of Exercise per Session: Not on file  Stress:   . Feeling of Stress : Not on file  Social  Connections:   . Frequency of Communication with Friends and Family: Not on file  . Frequency of Social Gatherings with Friends and Family: Not on file  . Attends Religious Services: Not on file  . Active Member of Clubs or Organizations: Not on file  . Attends Archivist Meetings: Not on file  . Marital Status: Not on file  Intimate Partner Violence:   . Fear of Current or Ex-Partner: Not on file  . Emotionally Abused: Not on file  . Physically Abused: Not on file  . Sexually Abused: Not on file    Family History:   [name] family history includes Cancer in her brother; Diabetes in her brother; Heart disease in her father; Hypertension in her brother and sister; Uterine cancer in her mother and sister. There is no history of Colon cancer, Colon polyps, Esophageal cancer, Rectal cancer, or Stomach cancer.    ROS:  Please see the history of present illness.  All other ROS reviewed and negative.     Physical Exam/Data:   Vitals:   04/13/20 1025 04/13/20 1030 04/13/20 1035 04/13/20 1050  BP: (!) 155/78 (!) 173/77  (!) 159/67  Pulse: 69 68 (!) 0 69  Resp: (!) 8 (!) 9 (!) 0 12  Temp:      SpO2: 96% 99%  97%  Weight:      Height:       No intake or output data in the 24 hours ending 04/13/20 1104 Last 3 Weights 04/13/2020 04/04/2020 02/08/2020  Weight (lbs) 138 lb 140 lb 9.6 oz 144 lb  Weight (kg) 62.596 kg 63.776 kg 65.318 kg     Body mass index is 21.61 kg/m.  General:  Well nourished, well developed, in no acute distress HEENT: normal Lymph: no adenopathy Neck: no JVD Endocrine:  No thryomegaly Vascular: No carotid bruits; FA pulses 2+ bilaterally without  bruits  Cardiac:  normal S1, S2; RRR; systolic murmur  Lungs:  clear to auscultation bilaterally, no wheezing, rhonchi or rales  Abd: soft, nontender, no hepatomegaly  Ext: no right LE edema, Left AKA Musculoskeletal:  No deformities, BUE and BLE strength normal and equal Skin: warm and dry  Neuro:  CNs  2-12 intact, no focal abnormalities noted Psych:  Normal affect    EKG:  The ECG that was done was personally reviewed and demonstrates sinus with non-specific ST and T wave abn  Relevant CV Studies:  Cardiac cath 04/13/20:  INTRAVASCULAR PRESSURE WIRE/FFR STUDY  RIGHT/LEFT HEART CATH AND CORONARY ANGIOGRAPHY  Conclusion    Prox RCA lesion is 50% stenosed.  Prox Cx to Mid Cx lesion is 99% stenosed.  Ost LAD to Prox LAD lesion is 70% stenosed.  Dist Cx lesion is 60% stenosed.  Ramus lesion is 90% stenosed.   1. Severe proximal LAD stenosis (DFR 0.88) 2. Severe ostial ramus intermediate stenosis 3. Severe proximal Circumflex stenosis. The proximal stenotic segment is heavily calcified and in a bend in the vessel with more calcific disease in the mid vessel.  4. The RCA is a large dominant artery with moderate proximal stenosis.  5. Elevated right and left heart pressures.   Recommendations: She has three vessel CAD. The large Circumflex is heavily calcified with the most severe lesion in a sharp bend in the vessel. PCI of this vessel would require orbital atherectomy and I do not feel that this would be safe given the bend in the vessel. The proximal LAD lesion is significant by pressure wire analysis. The large intermediate branch has severe disease. Will call CT surgery for CABG consult. She also has aortic valve disease which will need to be addressed.  Admit to telemetry. Continue ASA, beta blocker and statin.   Recommendations  Antiplatelet/Anticoag She has three vessel CAD. The large Circumflex is heavily calcified with the most severe lesion in a sharp bend in the vessel. PCI of this vessel would require orbital atherectomy and I do not feel that this would be safe given the bend in the vessel. The proximal LAD lesion is significant by pressure wire analysis. The large intermediate branch has severe disease. Will call CT surgery for CABG consult. She also has aortic valve  disease.  Admit to telemetry. Continue ASA, beta blocker and statin.  Indications  Unstable angina ([name]) [I20.0 (ICD-10-CM)]  Ischemic cardiomyopathy [I25.5 (ICD-10-CM)]  Acute on chronic combined systolic and diastolic CHF (congestive heart failure) ([place]) [I50.43 (ICD-10-CM)]  Procedural Details  Technical Details Indication: 64 yo female with ESRD on HD, DM, moderate AI, moderate pericardial effusion. Severe TR, with recent chest pain c/w unstable angina. New LV systolic dysfunction.   Procedure: The risks, benefits, complications, treatment options, and expected outcomes were discussed with the patient. The patient and/or family concurred with the proposed plan, giving informed consent. The patient was brought to the cath lab. The patient was sedated with Versed and Fentanyl. The right groin was prepped and draped in the usual manner. Using the modified Seldinger access technique, a 5 French sheath was placed in the right femoral artery and a 7 French sheath was placed in the right femoral vein using u/s guidance. Right heart catheterization performed with a balloon tipped catheter. Standard diagnostic catheters were used to perform selective coronary angiography. LV pressures measured with a JR4 catheter.  The proximal LAD lesion appeared to be moderate to severe by angiographic appearance. I engaged the left main with  a XB 3.0 guiding catheter. IV heparin used for anti-coagulation. I then passed a Comet pressure wire down the LAD. DFR 0.88.   There were no immediate complications. The patient was taken to the recovery area in stable condition.   Estimated blood loss <50 mL.   During this procedure medications were administered to achieve and maintain moderate conscious sedation while the patient's heart rate, blood pressure, and oxygen saturation were continuously monitored and I was present face-to-face 100% of this time.  Medications (Filter: Administrations occurring from [phone] to 1043 on  04/13/20) (important) Continuous medications are totaled by the amount administered until 04/13/20 1043.  0.9 % sodium chloride infusion (mL/hr) Total dose:  Cannot be calculated* Dosing weight:  62.6 *Continuous medication not stopped within the calculation time range. Date/Time  Rate/Dose/Volume Action  04/13/20 0943  10 mL/hr New Bag/Given    fentaNYL (SUBLIMAZE) injection (mcg) Total dose:  50 mcg Date/Time  Rate/Dose/Volume Action  04/13/20 0944  25 mcg Given  1021  25 mcg Given    midazolam (VERSED) injection (mg) Total dose:  3 mg Date/Time  Rate/Dose/Volume Action  04/13/20 0944  2 mg Given  1021  1 mg Given    lidocaine (PF) (XYLOCAINE) 1 % injection (mL) Total volume:  20 mL Date/Time  Rate/Dose/Volume Action  04/13/20 0950  20 mL Given    Heparin (Porcine) in NaCl 1000-0.9 UT/500ML-% SOLN (mL) Total volume:  1,000 mL Date/Time  Rate/Dose/Volume Action  04/13/20 0950  500 mL Given  0950  500 mL Given    heparin sodium (porcine) injection (Units) Total dose:  8,000 Units Date/Time  Rate/Dose/Volume Action  04/13/20 1017  8,000 Units Given    iohexol (OMNIPAQUE) 350 MG/ML injection (mL) Total volume:  100 mL Date/Time  Rate/Dose/Volume Action  04/13/20 1029  100 mL Given    Sedation Time  Sedation Time Physician-1: 42 minutes 30 seconds  Contrast  Medication Name Total Dose  iohexol (OMNIPAQUE) 350 MG/ML injection 100 mL    Radiation/Fluoro  Fluoro time: 6.7 (min) DAP: 18.6 (Gycm2) Cumulative Air Kerma: 258.5 (mGy)  Complications  Complications documented before study signed (04/13/2020 10:58 AM)     Log Level Complications  None Documented by Burnell Blanks, MD 04/13/2020 10:53 AM  Date Found: 04/13/2020  Time Range: Intraprocedure    Coronary Findings  Diagnostic Dominance: Left Left Anterior Descending  Vessel is large.  Ost LAD to Prox LAD lesion is 70% stenosed.  Ramus Intermedius  Vessel is large.  Ramus lesion is 90%  stenosed.  Left Circumflex  Vessel is large.  Prox Cx to Mid Cx lesion is 99% stenosed. The lesion is calcified.  Dist Cx lesion is 60% stenosed. The lesion is calcified.  Right Coronary Artery  Vessel is large.  Prox RCA lesion is 50% stenosed.  Intervention  No interventions have been documented. Coronary Diagrams  Diagnostic Dominance: Left  Intervention  Implants   No implant documentation for this case.  Syngo Images  Show images for CARDIAC CATHETERIZATION Images on Long Term Storage  Show images for [place], [place] to Procedure Log  Procedure Log    Hemo Data   Most Recent Value  Fick Cardiac Output 6.2 L/min  Fick Cardiac Output Index 3.58 (L/min)/BSA  Aortic Mean Gradient 12.76 mmHg  Aortic Peak Gradient 8 mmHg  Aortic Valve Area 1.90  Aortic Value Area Index 1.1 cm2/BSA  RA A Wave 11 mmHg  RA V Wave 18 mmHg  RA Mean 15 mmHg  RV Systolic Pressure 54 mmHg  RV Diastolic Pressure 6 mmHg  RV EDP 16 mmHg  PA Systolic Pressure 59 mmHg  PA Diastolic Pressure 24 mmHg  PA Mean 37 mmHg  PW A Wave 27 mmHg  PW V Wave 36 mmHg  PW Mean 25 mmHg  AO Systolic Pressure 161 mmHg  AO Diastolic Pressure 56 mmHg  AO Mean 88 mmHg  LV Systolic Pressure 096 mmHg  LV Diastolic Pressure 12 mmHg  LV EDP 20 mmHg  AOp Systolic Pressure 045 mmHg  AOp Diastolic Pressure 60 mmHg  AOp Mean Pressure 96 mmHg  LVp Systolic Pressure 409 mmHg  LVp Diastolic Pressure 12 mmHg  LVp EDP Pressure 22 mmHg  QP/QS 1  TPVR Index 10.34 HRUI  TSVR Index 26.25 HRUI  PVR SVR Ratio 0.15  TPVR/TSVR Ratio 0.39   Echo 03/28/20:  1. Left ventricular ejection fraction, by estimation, is 30 to 35%. The  left ventricle has moderately decreased function. The left ventricle  demonstrates global hypokinesis. The left ventricular internal cavity size  was mildly dilated. There is moderate  left ventricular hypertrophy. Left ventricular diastolic parameters are  consistent with Grade II  diastolic dysfunction (pseudonormalization).  2. Right ventricular systolic function is normal. The right ventricular  size is moderately enlarged. There is severely elevated pulmonary artery  systolic pressure. The estimated right ventricular systolic pressure is  81.1 mmHg.  3. Left atrial size was moderately dilated.  4. Right atrial size was moderately dilated.  5. Moderate pericardial effusion.  6. The mitral valve is grossly normal. Mild to moderate mitral valve  regurgitation. No evidence of mitral stenosis.  7. Tricuspid valve regurgitation is severe.  8. The aortic valve is calcified. Aortic valve regurgitation is moderate.  Mild to moderate aortic valve stenosis.  9. The inferior vena cava is dilated in size with <50% respiratory  variability, suggesting right atrial pressure of 15 mmHg.   Laboratory Data:  High Sensitivity Troponin:  No results for input(s): TROPONINIHS in the last 720 hours.    ChemistryNo results for input(s): NA, K, CL, CO2, GLUCOSE, BUN, CREATININE, CALCIUM, GFRNONAA, GFRAA, ANIONGAP in the last 168 hours.  No results for input(s): PROT, ALBUMIN, AST, ALT, ALKPHOS, BILITOT in the last 168 hours. HematologyNo results for input(s): WBC, RBC, HGB, HCT, MCV, MCH, MCHC, RDW, PLT in the last 168 hours. BNPNo results for input(s): BNP, PROBNP in the last 168 hours.  DDimer No results for input(s): DDIMER in the last 168 hours.   Radiology/Studies:  CARDIAC CATHETERIZATION  Result Date: 04/13/2020  Prox RCA lesion is 50% stenosed.  Prox Cx to Mid Cx lesion is 99% stenosed.  Ost LAD to Prox LAD lesion is 70% stenosed.  Dist Cx lesion is 60% stenosed.  Ramus lesion is 90% stenosed.  1. Severe proximal LAD stenosis (DFR 0.88) 2. Severe ostial ramus intermediate stenosis 3. Severe proximal Circumflex stenosis. The proximal stenotic segment is heavily calcified and in a bend in the vessel with more calcific disease in the mid vessel. 4. The RCA is a large  dominant artery with moderate proximal stenosis. 5. Elevated right and left heart pressures. Recommendations: She has three vessel CAD. The large Circumflex is heavily calcified with the most severe lesion in a sharp bend in the vessel. PCI of this vessel would require orbital atherectomy and I do not feel that this would be safe given the bend in the vessel. The proximal LAD lesion is significant by pressure wire analysis. The large intermediate branch  has severe disease. Will call CT surgery for CABG consult. She also has aortic valve disease which will need to be addressed. Admit to telemetry. Continue ASA, beta blocker and statin.     Assessment and Plan:   1. Unstable angina 2. Severe multiveseel CAD 3. Moderate aortic insufficiency 4. Ischemic cardiomypathy 5. ESRD on HD  She has three vessel CAD. The large Circumflex is heavily calcified with the most severe lesion in a sharp bend in the vessel. PCI of this vessel would require orbital atherectomy and I do not feel that this would be safe given the bend in the vessel. The proximal LAD lesion is significant by pressure wire analysis. The large intermediate branch has severe disease. Will call CT surgery for CABG consult. She also has aortic valve disease which will need to be addressed.  Admit to telemetry. Continue ASA, beta blocker and statin.     For questions or updates, please contact [facility] Please consult [email or web address] for contact info under     Signed, Lauree Chandler, MD  04/13/2020 11:04 AM

## 2020-04-13 NOTE — Progress Notes (Addendum)
Site area: rt groin fa and fv sheaths Site Prior to Removal:  Level 0 Pressure Applied For: 50 minutes Manual:   yes Patient Status During Pull:  stable Post Pull Site:  Level 0 Post Pull Instructions Given:  yes Post Pull Pulses Present: rt pt dopplered Dressing Applied:  Gauze and tegaderm Bedrest begins @ 1320 Comments:

## 2020-04-13 NOTE — Consult Note (Addendum)
[address]       [place] [address]             [phone]        Brandy [facility] Medical Record #[id number] Date of Birth: 1956-05-03  Referring: No ref. provider found Primary Care: [name], MD Primary Cardiologist:No primary care provider on file.  Chief Complaint:   Congestive heart failure  History of Present Illness:    We are asked to see this 64 year old female with a history of multiple cardiac risk factors and comorbidities for consideration of coronary artery surgical revascularization.  She has a history of end-stage renal disease on hemodialysis, type 2 diabetes mellitus, moderate aortic regurgitation, hypertension, anemia, and history of left AKA status post septic arthritis.  The patient has a history of intermittent chest pain that occurs approximately 2 times per week typically after hemodialysis.  His pain is described as cramping in nature 5-6/10 in intensity and worse with inspiration.  Typically the pain lasts approximately 5 to 10 minutes as it would not felt to be related to exertion.  She does not have a different type of pain at times with exertion that she describes as substernal chest tightness and this resolves with rest.  She gets this type of pain much less frequently primarily with overexertion and physical therapy.  A lexicon Myoview was done on 04/15/2019 and this revealed small fixed defect at the apex with no other significant abnormalities.  A TTE done on 04/13/2019 showed mild aortic stenosis, moderate aortic regurgitation, normal EF, grade 2 diastolic dysfunction and normal RV function.  She has a history of a Zio patch study x7 days on 02/01/2020 which revealed one 4 beat run of non-SVT but was otherwise unremarkable.  Repeat echocardiogram done 03/28/2020 showed new LV systolic dysfunction with an EF of 30 to 35% and findings consistent with global hypokinesis, moderate LVH, grade 2 diastolic dysfunction, normal RV  function, moderate RV enlargement, severe elevation in RVSP [ 74 mmHg] moderate pericardial effusion, moderate biatrial dilatation, mild to moderate mitral regurgitation, severe tricuspid regurgitation and moderate aortic regurgitation.  She had an EGD done in 02/08/2020 which revealed esophagitis with no bleeding, gastric antral vascular ectasia with bleeding.  She was treated with twice daily Protonix. She has a procedure scheduled for 11/29 for further management.  She recently has developed occasional lightheadedness after hemodialysis with no syncope.  But is having significant dyspnea and fatigue with minimal exertion as well as chest pain during hemodialysis.  Due to these increasing symptoms and worsening overall cardiac status she was admitted today for right and left heart catheterization with coronary angiography and we are asked to see in cardiothoracic surgical consultation.  Please see the full report below.  She has a 50% proximal RCA lesion, 99% proximal circumflex to mid circumflex lesion, 70% ostial LAD to proximal LAD lesion, distal circumflex lesion at 60% stenosis and a ramus lesion at 90% stenosis.  She also has elevated right and left heart pressures.  Her most recent echocardiogram from 03/28/2020 also has full report listed below.    Current Activity/ Functional Status: She does not exercise and she uses a walker to assist with ambulation.   Zubrod Score: At the time of surgery this patient's most appropriate activity status/level should be described as: '[]'     0    Normal activity, no symptoms '[]'     1    Restricted in physical strenuous activity  but ambulatory, able to do out light work '[x]'     2    Ambulatory and capable of self care, unable to do work activities, up and about                 more than 50%  Of the time                            '[]'     3    Only limited self care, in bed greater than 50% of waking hours '[]'     4    Completely disabled, no self care, confined to bed  or chair '[]'     5    Moribund  Past Medical History:  Diagnosis Date  . Allergy   . Anemia   . Anxiety   . Arthritis    "in my joints" (03/10/2017)  . Chronic diastolic CHF (congestive heart failure) ([facility])   . CKD (chronic kidney disease), stage IV ([facility])    stage IV. previous HD, none currently 10/30/15 (confirmed 02/05/2017 & 03/10/2017)  . Depression    Chronic  . Diabetic peripheral neuropathy ([facility])   . Dysrhythmia    tachycardia, normal ECHO 08-09-14  . GERD (gastroesophageal reflux disease)   . Heart murmur     dx'd 02/05/2017  . Hepatitis C    "tx'd in 2016; I'm negative now" (02/05/2017)  . History of blood transfusion 2017   "w/knee replacement"  . Hyperlipidemia   . Hypertension   . Osteoarthritis of left knee   . Peripheral neuropathy   . Protein calorie malnutrition ([place])   . Septic arthritis ([facility])   . Slow transit constipation   . Symptomatic anemia 01/27/2020  . Type II diabetes mellitus ([name])    IDDM  . Uncontrolled hypertension 02/18/2013  . Unsteady gait     Past Surgical History:  Procedure Laterality Date  . A/V FISTULAGRAM Right 07/07/2018   Procedure: A/V FISTULAGRAM;  Surgeon: Marty Heck, MD;  Location: [facility];  Service: Cardiovascular;  Laterality: Right;  . AMPUTATION Left 03/20/2017   Procedure: LEFT ABOVE KNEE AMPUTATION;  Surgeon: Newt Minion, MD;  Location: [facility];  Service: Orthopedics;  Laterality: Left;  . APPENDECTOMY    . AV FISTULA PLACEMENT Left 09/13/2014   Procedure: Brachial Artery to Brachial Vein Gortex Four - Seven Stretch GRAFT INSERTION Left Forearm;  Surgeon: Mal Misty, MD;  Location: [facility];  Service: Vascular;  Laterality: Left;  . BASCILIC VEIN TRANSPOSITION Right 10/26/2017   Procedure: BASILIC VEIN TRANSPOSITION FIRST STAGE RIGHT ARM;  Surgeon: Conrad Mitchell, MD;  Location: [facility];  Service: Vascular;  Laterality: Right;  . BASCILIC VEIN TRANSPOSITION Right 02/01/2018   Procedure: SECOND STAGE BASILIC VEIN  TRANSPOSITION RIGHT UPPER EXTREMITY;  Surgeon: Marty Heck, MD;  Location: [facility];  Service: Vascular;  Laterality: Right;  . CHOLECYSTECTOMY OPEN    . COLON SURGERY    . COLONOSCOPY    . EXCISIONAL TOTAL KNEE ARTHROPLASTY WITH ANTIBIOTIC SPACERS Left 02/06/2017   Procedure: Incisional total iknee with antibiotic spacer ;  Surgeon: Leandrew Koyanagi, MD;  Location: [facility];  Service: Orthopedics;  Laterality: Left;  . IR FLUORO GUIDE CV LINE RIGHT  02/10/2017  . IR REMOVAL TUN CV CATH W/O FL  04/01/2017  . IR US GUIDE VASC ACCESS RIGHT  02/10/2017  . IRRIGATION AND DEBRIDEMENT KNEE Left 03/12/2017   Procedure: IRRIGATION AND DEBRIDEMENT  LEFT KNEE WITH WOUND VAC APPLICATION;  Surgeon: Leandrew Koyanagi, MD;  Location: [facility];  Service: Orthopedics;  Laterality: Left;  . JOINT REPLACEMENT    . KNEE ARTHROSCOPY Right 08/10/2014   Procedure: ARTHROSCOPY I & D KNEE;  Surgeon: Marianna Payment, MD;  Location: [facility];  Service: Orthopedics;  Laterality: Right;  . KNEE ARTHROSCOPY Left 08/11/2014   Procedure: ARTHROSCOPIC WASHOUT LEFT KNEE;  Surgeon: Marianna Payment, MD;  Location: [facility];  Service: Orthopedics;  Laterality: Left;  . KNEE ARTHROSCOPY Left 08/19/2014   Procedure: ARTHROSCOPIC WASHOUT LEFT KNEE;  Surgeon: Leandrew Koyanagi, MD;  Location: [facility];  Service: Orthopedics;  Laterality: Left;  . KNEE ARTHROSCOPY WITH LATERAL MENISECTOMY Left 04/04/2015   Procedure: AND PARTIAL LATERAL MENISECTOMY;  Surgeon: Leandrew Koyanagi, MD;  Location: [facility];  Service: Orthopedics;  Laterality: Left;  . KNEE ARTHROSCOPY WITH MEDIAL MENISECTOMY Left 04/04/2015   Procedure: LEFT KNEE ARTHROSCOPY WITH PARTIAL MEDIAL MENISCECTOMY  AND SYNOVECTOMY;  Surgeon: Leandrew Koyanagi, MD;  Location: [facility];  Service: Orthopedics;  Laterality: Left;  . PERIPHERAL VASCULAR BALLOON ANGIOPLASTY  07/07/2018   Procedure: PERIPHERAL VASCULAR BALLOON ANGIOPLASTY;  Surgeon: Marty Heck, MD;  Location:  [facility];  Service: Cardiovascular;;  right AV fistula  . POLYPECTOMY    . SHOULDER ARTHROSCOPY Bilateral 08/10/2014   Procedure: I & D BILATERAL SHOULDERS ;  Surgeon: Marianna Payment, MD;  Location: [facility];  Service: Orthopedics;  Laterality: Bilateral;  . SMALL INTESTINE SURGERY     Due to Small Bowel Obstruction; "fixed it when they did my gallbladder OR"  . TEE WITHOUT CARDIOVERSION N/A 08/14/2014   Procedure: TRANSESOPHAGEAL ECHOCARDIOGRAM (TEE);  Surgeon: Thayer Headings, MD;  Location: [facility];  Service: Cardiovascular;  Laterality: N/A;  . TENOSYNOVECTOMY Right 08/11/2014   Procedure: RIGHT WRIST IRRIGATION AND DEBRIDEMENT, TENOSYNOVECTOMY;  Surgeon: Marianna Payment, MD;  Location: [facility];  Service: Orthopedics;  Laterality: Right;  . TOTAL KNEE ARTHROPLASTY Left 11/07/2015   Procedure: LEFT TOTAL KNEE ARTHROPLASTY WITH REVISION OF IMPLANTS;  Surgeon: Leandrew Koyanagi, MD;  Location: [facility];  Service: Orthopedics;  Laterality: Left;  . TUBAL LIGATION      Social History   Tobacco Use  Smoking Status Current Every Day Smoker  . Packs/day: 0.50  . Years: 35.00  . Pack years: 17.50  . Types: Cigarettes  Smokeless Tobacco Never Used    Social History   Substance and Sexual Activity  Alcohol Use Not Currently  . Alcohol/week: 0.0 standard drinks   Comment: 03/10/2017 "I drink a wine cooler a few times/year"     Allergies  Allergen Reactions  . Compazine [Prochlorperazine] Shortness Of Breath, Swelling and Other (See Comments)    TONGUE SWELLS  . Shellfish-Derived Products Anaphylaxis  . Iodinated Diagnostic Agents Hives and Rash  . Omnipaque [Iohexol] Hives  . Tape     Brown paper tape caused skin irritation  . Sulfa Antibiotics Rash    Current Facility-Administered Medications  Medication Dose Route Frequency Provider Last Rate Last Admin  . 0.9 %  sodium chloride infusion  250 mL Intravenous PRN Donato Heinz, MD      . 0.9 %  sodium chloride  infusion   Intravenous Continuous Oswaldo Milian L, MD      . 0.9 %  sodium chloride infusion  250 mL Intravenous PRN Burnell Blanks, MD      . Derrill Memo ON 04/14/2020] aspirin  chewable tablet 81 mg  81 mg Oral Lizabeth Leyden, MD      . sodium chloride flush (NS) 0.9 % injection 3 mL  3 mL Intravenous PRN Donato Heinz, MD        Facility-Administered Medications Prior to Admission  Medication Dose Route Frequency Provider Last Rate Last Admin  . sodium chloride flush (NS) 0.9 % injection 3 mL  3 mL Intravenous Q12H Donato Heinz, MD       Medications Prior to Admission  Medication Sig Dispense Refill Last Dose  . Accu-Chek Softclix Lancets lancets Check sugars three times a day for E11.65 100 each 12 04/12/2020 at Unknown time  . acetaminophen (TYLENOL) 500 MG tablet Take 500-1,000 mg by mouth every 6 (six) hours as needed (for pain.).   04/12/2020 at Unknown time  . aspirin EC 81 MG tablet Take 81 mg by mouth daily.   04/13/2020 at 0610  . atorvastatin (LIPITOR) 10 MG tablet TAKE 1 TABLET BY MOUTH AT BEDTIME . APPOINTMENT REQUIRED FOR FUTURE REFILLS (Patient taking differently: Take 10 mg by mouth at bedtime. ) 90 tablet 3 04/12/2020 at Unknown time  . blood glucose meter kit and supplies KIT Dispense based on patient and insurance preference. Use up to four times daily as directed 1 each 0 04/12/2020 at Unknown time  . Blood Glucose Monitoring Suppl (ACCU-CHEK GUIDE) w/Device KIT 1 Device by Does not apply route daily. 1 kit 3 04/12/2020 at Unknown time  . calcium acetate (PHOSLO) 667 MG capsule Take 1,334-2,001 mg by mouth See admin instructions. Take 3 capsules (2001 mg) by mouth with breakfast, take 2 capsules (1334 mg) by mouth with lunch, take 3 capsules (2001 mg) by mouth with supper & take 2 capsules (1334 mg) by mouth with snacks.   04/12/2020 at Unknown time  . carvedilol (COREG) 3.125 MG tablet Take 1 tablet (3.125 mg total) by mouth  2 (two) times daily. 180 tablet 3 04/13/2020 at Unknown time  . cloNIDine (CATAPRES) 0.1 MG tablet Take 0.1 mg by mouth every 12 (twelve) hours.   04/13/2020 at Unknown time  . diphenhydrAMINE (BENADRYL) 25 mg capsule Take 25 mg by mouth every 6 (six) hours as needed for allergies.   04/13/2020 at Unknown time  . gabapentin (NEURONTIN) 100 MG capsule Take 200 mg by mouth See admin instructions. Take 2 capsules (200 mg) by mouth 3 times on Sundays, Mondays, Wednesdays, & Fridays.   04/13/2020 at Unknown time  . gabapentin (NEURONTIN) 300 MG capsule Take 300 mg by mouth See admin instructions. Take 1 capsule (300 mg) by mouth twice daily on Tuesdays, Thursdays, & Saturdays.   04/13/2020 at Unknown time  . glucose blood (ACCU-CHEK GUIDE) test strip Check blood sugar up to 4 times a day 100 each 12 04/12/2020 at Unknown time  . hydrALAZINE (APRESOLINE) 100 MG tablet Take 100 mg by mouth in the morning and at bedtime.    04/13/2020 at Unknown time  . Lancet Devices (ACCU-CHEK SOFTCLIX) lancets Check sugars TID for E11.65 1 each 10 04/12/2020 at Unknown time  . lidocaine-prilocaine (EMLA) cream Apply 1 application topically Every Tuesday,Thursday,and Saturday with dialysis. On days of dialysis 3x week   04/12/2020 at Unknown time  . methocarbamol (ROBAXIN) 500 MG tablet Take 500 mg by mouth every 8 (eight) hours as needed for muscle spasms.    Past Week at Unknown time  . Methoxy PEG-Epoetin Beta (MIRCERA IJ) Get it at dialysis   04/12/2020 at Unknown time  .  multivitamin (RENA-VIT) TABS tablet Take 1 tablet by mouth daily.   04/12/2020 at Unknown time  . pantoprazole (PROTONIX) 20 MG tablet Take 20 mg by mouth 2 (two) times daily.   04/13/2020 at Unknown time  . polyethylene glycol (MIRALAX / GLYCOLAX) 17 g packet Take 17 g by mouth daily as needed for moderate constipation.   04/12/2020 at Unknown time  . sertraline (ZOLOFT) 100 MG tablet Take 1 tablet by mouth once daily (Patient taking differently: Take  100 mg by mouth every evening. ) 90 tablet 3 04/12/2020 at Unknown time  . traZODone (DESYREL) 50 MG tablet Take 50 mg by mouth at bedtime as needed for sleep.    04/12/2020 at Unknown time  . nystatin (MYCOSTATIN) 100000 UNIT/ML suspension 200,000 units  Take 2 cc by mouth qid x 5 days (Patient not taking: Reported on 04/06/2020) 60 mL 0 Not Taking at Unknown time  . predniSONE (DELTASONE) 50 MG tablet Take 1 tablet 13 hours prior to procedure, 1 tablet 7 hours prior to procedure, and 1 tablet (with Benedryl 50 mg) prior to going to the hospital for procedure (Patient not taking: Reported on 04/06/2020) 3 tablet 0 Not Taking at Unknown time    Family History  Problem Relation Age of Onset  . Uterine cancer Mother   . Heart disease Father   . Hypertension Sister   . Uterine cancer Sister   . Cancer Brother        unsure type  . Diabetes Brother   . Hypertension Brother   . Colon cancer Neg Hx   . Colon polyps Neg Hx   . Esophageal cancer Neg Hx   . Rectal cancer Neg Hx   . Stomach cancer Neg Hx      Review of Systems:       Cardiac Review of Systems: Y or  [    ]= no  Chest Pain [  y  ]  Resting SOB [   ] Exertional SOB  [ y ]  Orthopnea [  ]   Pedal Edema [   ]    Palpitations [ y ] Syncope  [  ]   Presyncope [   ]  General Review of Systems: [Y] = yes [  ]=no Constitional: recent weight change [  ]; anorexia [ y ]; fatigue [  ]; nausea [ y ]; night sweats [  ]; fever [  ]; or chills [  ]                                                               Dental: Last Dentist visit:   Eye : blurred vision [  ]; diplopia [   ]; vision changes [  ];  Amaurosis fugax[  ]; Resp: cough [  ];  wheezing[  ];  hemoptysis[  ]; shortness of breath[ y ]; paroxysmal nocturnal dyspnea[  ]; dyspnea on exertion[ y ]; or orthopnea[  ];  GI:  gallstones[  ], vomiting[  ];  dysphagia[  ]; melena[  ];  hematochezia [ y ]; heartburn[  ];   Hx of  Colonoscopy[  ]; GU: kidney stones [  ]; hematuria[  ];    dysuria [  ];  nocturia[  ];  history of  obstruction [  ]; urinary frequency [  ]             Skin: rash, swelling[  ];, hair loss[  ];  peripheral edema[  ];  or itching[  ]; Musculosketetal: myalgias[ y ];  joint swelling[  ];  joint erythema[  ];  joint pain[y  ];  back pain[  ];  Heme/Lymph: bruising[y  ];  bleeding[y  ];  anemia[ y ];  Neuro: TIA[  ];  headaches[  ];  stroke[  ];  vertigo[  ];  seizures[  ];   paresthesias[  ];  difficulty walking[y  ];  Psych:depression[ y ]; anxiety[  ];  Endocrine: diabetes[ y ];  thyroid dysfunction[  ];            Full dentures                Physical Exam: BP (!) 175/68   Pulse 67   Temp 98.4 F (36.9 C)   Resp 19   Ht '5\' 7"'  (1.702 m)   Wt 62.6 kg   LMP 03/16/2002   SpO2 98%   BMI 21.61 kg/m    Physical Exam  Constitutional: No distress. She appears chronically ill.  HENT:  Nose: No nasal discharge.  Mouth/Throat: Oropharynx is clear. Pharynx is normal.  Full dentures  Eyes: Pupils are equal, round, and reactive to light. Conjunctivae are normal.  Neck: No JVD present. No neck adenopathy. No thyromegaly present.  + transmitted bruit/murmur to carotids  Cardiovascular: Regular rhythm, S1 normal and S2 normal. Exam reveals no gallop.  Murmur heard. Pulses:      Radial pulses are 1+ on the right side and 1+ on the left side.       Femoral pulses are 1+ on the right side and 1+ on the left side. Left AKA Right DP pulse is palpable  Pulmonary/Chest: Effort normal and breath sounds normal. She has no wheezes. She exhibits no tenderness.  Abdominal: Soft. Bowel sounds are normal. She exhibits no distension and no mass. There is hepatomegaly. There is abdominal tenderness.  Some TTP epigastrium  Musculoskeletal:        General: No tenderness, deformity or edema.     Cervical back: Normal range of motion.     Comments: Left AKA  Neurological: She is alert and oriented to person, place, and time.  Some involuntary motor  movements in extremeties  Skin: Skin is warm and dry. No cyanosis. No jaundice or pallor. Nails show no clubbing.    Diagnostic Studies & Laboratory data:     Recent Radiology Findings:   Procedures  INTRAVASCULAR PRESSURE WIRE/FFR STUDY  RIGHT/LEFT HEART CATH AND CORONARY ANGIOGRAPHY  Conclusion    Prox RCA lesion is 50% stenosed.  Prox Cx to Mid Cx lesion is 99% stenosed.  Ost LAD to Prox LAD lesion is 70% stenosed.  Dist Cx lesion is 60% stenosed.  Ramus lesion is 90% stenosed.   1. Severe proximal LAD stenosis (DFR 0.88) 2. Severe ostial ramus intermediate stenosis 3. Severe proximal Circumflex stenosis. The proximal stenotic segment is heavily calcified and in a bend in the vessel with more calcific disease in the mid vessel.  4. The RCA is a large dominant artery with moderate proximal stenosis.  5. Elevated right and left heart pressures.   Recommendations: She has three vessel CAD. The large Circumflex is heavily calcified with the most severe lesion in a sharp bend in the vessel. PCI of this vessel would  require orbital atherectomy and I do not feel that this would be safe given the bend in the vessel. The proximal LAD lesion is significant by pressure wire analysis. The large intermediate branch has severe disease. Will call CT surgery for CABG consult. She also has aortic valve disease which will need to be addressed.  Admit to telemetry. Continue ASA, beta blocker and statin.   Recommendations  Antiplatelet/Anticoag She has three vessel CAD. The large Circumflex is heavily calcified with the most severe lesion in a sharp bend in the vessel. PCI of this vessel would require orbital atherectomy and I do not feel that this would be safe given the bend in the vessel. The proximal LAD lesion is significant by pressure wire analysis. The large intermediate branch has severe disease. Will call CT surgery for CABG consult. She also has aortic valve disease.  Admit to  telemetry. Continue ASA, beta blocker and statin.  Indications  Unstable angina ([name]) [I20.0 (ICD-10-CM)]  Ischemic cardiomyopathy [I25.5 (ICD-10-CM)]  Acute on chronic combined systolic and diastolic CHF (congestive heart failure) ([place]) [I50.43 (ICD-10-CM)]  Procedural Details  Technical Details Indication: 64 yo female with ESRD on HD, DM, moderate AI, moderate pericardial effusion. Severe TR, with recent chest pain c/w unstable angina. New LV systolic dysfunction.   Procedure: The risks, benefits, complications, treatment options, and expected outcomes were discussed with the patient. The patient and/or family concurred with the proposed plan, giving informed consent. The patient was brought to the cath lab. The patient was sedated with Versed and Fentanyl. The right groin was prepped and draped in the usual manner. Using the modified Seldinger access technique, a 5 French sheath was placed in the right femoral artery and a 7 French sheath was placed in the right femoral vein using u/s guidance. Right heart catheterization performed with a balloon tipped catheter. Standard diagnostic catheters were used to perform selective coronary angiography. LV pressures measured with a JR4 catheter.  The proximal LAD lesion appeared to be moderate to severe by angiographic appearance. I engaged the left main with a XB 3.0 guiding catheter. IV heparin used for anti-coagulation. I then passed a Comet pressure wire down the LAD. DFR 0.88.   There were no immediate complications. The patient was taken to the recovery area in stable condition.   Estimated blood loss <50 mL.   During this procedure medications were administered to achieve and maintain moderate conscious sedation while the patient's heart rate, blood pressure, and oxygen saturation were continuously monitored and I was present face-to-face 100% of this time.  Medications (Filter: Administrations occurring from [phone] to 1043 on  04/13/20) (important) Continuous medications are totaled by the amount administered until 04/13/20 1043.  0.9 % sodium chloride infusion (mL/hr) Total dose:  Cannot be calculated* Dosing weight:  62.6  *Continuous medication not stopped within the calculation time range. Date/Time  Rate/Dose/Volume Action  04/13/20 0943  10 mL/hr New Bag/Given    fentaNYL (SUBLIMAZE) injection (mcg) Total dose:  50 mcg  Date/Time  Rate/Dose/Volume Action  04/13/20 0944  25 mcg Given  1021  25 mcg Given    midazolam (VERSED) injection (mg) Total dose:  3 mg  Date/Time  Rate/Dose/Volume Action  04/13/20 0944  2 mg Given  1021  1 mg Given    lidocaine (PF) (XYLOCAINE) 1 % injection (mL) Total volume:  20 mL  Date/Time  Rate/Dose/Volume Action  04/13/20 0950  20 mL Given    Heparin (Porcine) in NaCl 1000-0.9 UT/500ML-% SOLN (mL) Total volume:  1,000 mL  Date/Time  Rate/Dose/Volume Action  04/13/20 0950  500 mL Given  0950  500 mL Given    heparin sodium (porcine) injection (Units) Total dose:  8,000 Units  Date/Time  Rate/Dose/Volume Action  04/13/20 1017  8,000 Units Given    iohexol (OMNIPAQUE) 350 MG/ML injection (mL) Total volume:  100 mL  Date/Time  Rate/Dose/Volume Action  04/13/20 1029  100 mL Given    Sedation Time  Sedation Time Physician-1: 42 minutes 30 seconds  Contrast  Medication Name Total Dose  iohexol (OMNIPAQUE) 350 MG/ML injection 100 mL    Radiation/Fluoro  Fluoro time: 6.7 (min) DAP: 18.6 (Gycm2) Cumulative Air Kerma: 676.1 (mGy)  Complications  Complications documented before study signed (04/13/2020 10:58 AM)     Log Level Complications  None Documented by Burnell Blanks, MD 04/13/2020 10:53 AM  Date Found: 04/13/2020  Time Range: Intraprocedure    Coronary Findings  Diagnostic Dominance: Left Left Anterior Descending  Vessel is large.  Ost LAD to Prox LAD lesion is 70% stenosed.  Ramus Intermedius  Vessel is large.  Ramus  lesion is 90% stenosed.  Left Circumflex  Vessel is large.  Prox Cx to Mid Cx lesion is 99% stenosed. The lesion is calcified.  Dist Cx lesion is 60% stenosed. The lesion is calcified.  Right Coronary Artery  Vessel is large.  Prox RCA lesion is 50% stenosed.  Intervention  No interventions have been documented. Coronary Diagrams  Diagnostic Dominance: Left  Intervention       ECHOCARDIOGRAM REPORT       Patient Name:  BRENNLEY CURTICE Date of Exam: 03/28/2020  Medical Rec #: [id number]   Height:    67.0 in  Accession #:  [id number]  Weight:    144.0 lb  Date of Birth: Aug 23, 1955   BSA:     1.759 m  Patient Age:  18 years   BP:      120/64 mmHg  Patient Gender: F       HR:      91 bpm.  Exam Location: [facility]   Procedure: 2D Echo, Cardiac Doppler and Color Doppler   Indications:  I35.1    History:    Patient has prior history of Echocardiogram examinations,  most         recent 04/13/2019. AI, Arrythmias:ESRD; Risk         Factors:Hypertension and Diabetes.    Sonographer:  Coralyn [facility] RDCS  Referring Phys: [id number] [place]    1. Left ventricular ejection fraction, by estimation, is 30 to 35%. The  left ventricle has moderately decreased function. The left ventricle  demonstrates global hypokinesis. The left ventricular internal cavity size  was mildly dilated. There is moderate  left ventricular hypertrophy. Left ventricular diastolic parameters are  consistent with Grade II diastolic dysfunction (pseudonormalization).  2. Right ventricular systolic function is normal. The right ventricular  size is moderately enlarged. There is severely elevated pulmonary artery  systolic pressure. The estimated right ventricular systolic pressure is  76.7 mmHg.  3. Left atrial size was moderately dilated.  4. Right atrial size was moderately dilated.  5.  Moderate pericardial effusion.  6. The mitral valve is grossly normal. Mild to moderate mitral valve  regurgitation. No evidence of mitral stenosis.  7. Tricuspid valve regurgitation is severe.  8. The aortic valve is calcified. Aortic valve regurgitation is moderate.  Mild to moderate aortic valve stenosis.  9. The inferior vena cava is  dilated in size with <50% respiratory  variability, suggesting right atrial pressure of 15 mmHg.   FINDINGS  Left Ventricle: Left ventricular ejection fraction, by estimation, is 30  to 35%. The left ventricle has moderately decreased function. The left  ventricle demonstrates global hypokinesis. The left ventricular internal  cavity size was mildly dilated.  There is moderate left ventricular hypertrophy. Left ventricular diastolic  parameters are consistent with Grade II diastolic dysfunction  (pseudonormalization).   Right Ventricle: The right ventricular size is moderately enlarged. No  increase in right ventricular wall thickness. Right ventricular systolic  function is normal. There is severely elevated pulmonary artery systolic  pressure. The tricuspid regurgitant  velocity is 3.85 m/s, and with an assumed right atrial pressure of 15  mmHg, the estimated right ventricular systolic pressure is 94.8 mmHg.   Left Atrium: Left atrial size was moderately dilated.   Right Atrium: Right atrial size was moderately dilated.   Pericardium: A moderately sized pericardial effusion is present.   Mitral Valve: The mitral valve is grossly normal. Mild to moderate mitral  valve regurgitation. No evidence of mitral valve stenosis.   Tricuspid Valve: The tricuspid valve is grossly normal. Tricuspid valve  regurgitation is severe.   Aortic Valve: The aortic valve is calcified. Aortic valve regurgitation is  moderate. Aortic regurgitation PHT measures 300 msec. Mild to moderate  aortic stenosis is present. Aortic valve mean gradient measures 18.7 mmHg.   Aortic valve peak gradient  measures 34.4 mmHg. Aortic valve area, by VTI measures 0.98 cm.   Pulmonic Valve: The pulmonic valve was normal in structure. Pulmonic valve  regurgitation is not visualized.   Aorta: The aortic root and ascending aorta are structurally normal, with  no evidence of dilitation.   Venous: The inferior vena cava is dilated in size with less than 50%  respiratory variability, suggesting right atrial pressure of 15 mmHg.   IAS/Shunts: The atrial septum is grossly normal.     LEFT VENTRICLE  PLAX 2D  LVIDd:     4.70 cm Diastology  LVIDs:     3.60 cm LV e' medial:  5.11 cm/s  LV PW:     1.50 cm LV E/e' medial: 25.2  LV IVS:    1.40 cm LV e' lateral:  9.03 cm/s  LVOT diam:   2.00 cm LV E/e' lateral: 14.3  LV SV:     55  LV SV Index:  31  LVOT Area:   3.14 cm     RIGHT VENTRICLE  RV S prime:   14.70 cm/s  TAPSE (M-mode): 1.8 cm  RVSP:      67.3 mmHg   LEFT ATRIUM       Index    RIGHT ATRIUM      Index  LA diam:    4.40 cm 2.50 cm/m RA Pressure: 8.00 mmHg  LA Vol (A2C):  89.2 ml 50.72 ml/m RA Area:   26.70 cm  LA Vol (A4C):  60.6 ml 34.46 ml/m RA Volume:  94.50 ml 53.73 ml/m  LA Biplane Vol: 75.3 ml 42.81 ml/m  AORTIC VALVE  AV Area (Vmax):  0.96 cm  AV Area (Vmean):  0.99 cm  AV Area (VTI):   0.98 cm  AV Vmax:      293.33 cm/s PULMONARY ARTERY  AV Vmean:     200.000 cm/s MPA diam:    3.00 cm  AV VTI:      0.564 m  AV Peak Grad:   34.4 mmHg  AV  Mean Grad:   18.7 mmHg  LVOT Vmax:     89.70 cm/s  LVOT Vmean:    63.000 cm/s  LVOT VTI:     0.176 m  LVOT/AV VTI ratio: 0.31  AI PHT:      300 msec    AORTA  Ao Root diam: 3.20 cm  Ao Asc diam: 3.50 cm   MV E velocity: 129.00 cm/s TRICUSPID VALVE  MV A velocity: 108.00 cm/s TR Peak grad:  59.3 mmHg  MV E/A ratio: 1.19     TR Vmax:    385.00 cm/s                Estimated RAP: 8.00 mmHg               RVSP:      67.3 mmHg                 SHUNTS               Systemic VTI: 0.18 m               Systemic Diam: 2.00 cm   Mertie Moores MD  Electronically signed by Mertie Moores MD  Signature Date/Time: 03/28/2020/1:54:57 PM        I have independently reviewed the above radiologic studies and discussed with the patient   Recent Lab Findings: Lab Results  Component Value Date   WBC 5.1 04/04/2020   HGB 7.8 (L) 04/04/2020   HCT 25.5 (L) 04/04/2020   PLT 230 04/04/2020   GLUCOSE 84 04/04/2020   CHOL 111 12/10/2018   TRIG 82.0 12/10/2018   HDL 61.40 12/10/2018   LDLCALC 33 12/10/2018   ALT 23 01/26/2020   AST 14 (L) 01/26/2020   NA 136 04/04/2020   K 4.2 04/04/2020   CL 94 (L) 04/04/2020   CREATININE 3.95 (H) 04/04/2020   BUN 31 (H) 04/04/2020   CO2 25 04/04/2020   TSH 0.589 08/07/2014   INR 1.0 12/29/2016   HGBA1C 4.8 10/31/2019      Assessment / Plan: Severe three-vessel coronary artery disease in the setting of unstable angina. Recent upper GI bleed End-stage renal disease on dialysis Hepatitis C Chronic combined systolic and diastolic congestive heart failure Diabetic peripheral neuropathy Major depression, recurrent, chronic Anemia of chronic kidney disease Type 2 diabetes Status post left AKA Hypertension Iron deficiency anemia Ongoing chronic tobacco use current 1/2 pack/day  I  spent 55 minutes counseling the patient face to face.   John Giovanni, PA-C 04/13/2020 12:08 PM  Patient examined, images of coronary arteriograms and 2D echocardiogram personally reviewed and counseled with patient.  64 year old diabetic smoker on dialysis status post left AKA presents with symptoms of angina following dialysis.  She has hypertension anemia and recent upper GI bleed.  A year ago her cardiologist obtained a myocardial perfusion stress  test which was low risk and an echo which showed EF of 60%, CVP 3, moderate AI and mild TR.  Since that time her cardiac function has significantly deteriorated with EF 30%, moderate AI but with severe TR and pulmonary hypertension with PA pressures 70/25, wedge 25 and CVP 15 with RV dilatation saphenous vein in  her remaining leg on exam appears small and atretic.  Her coronary vessels are adequate targets however her cardiac function and pulmonary hypertension and valvular insufficiency with poor conduit availability would make her very high risk for CABG- she would not benefit.  Recommend consideration of PCI, medical therapy and  smoke cessation  Dahlia Byes MD

## 2020-04-13 NOTE — Consult Note (Addendum)
[facility] Renal Consultation Note    Indication for Consultation:  Management of ESRD/hemodialysis; anemia, hypertension/volume and secondary hyperparathyroidism  HPI: Brandy Houston is a 64 y.o. female with ESRD on HD, DMT2, HTN, septic arthritis s/p L AKA, H/o GI bleed. Outpatient Echo 03/28/20 showed decreased EF 30-35%, severe TR, mild-mod AS. She underwent diagnostic cardiac cath today which showed severe 3V CAD not amenable to PCI. She will be admitted for further management. CTS consulted for possible CABG. Nephrology consulted for routine dialysis.   Dialyzes TTS at [facility]. Using RUE AVF. Last dialysis was Thursday. She has been compliant with dialysis treatments.  Seen and examined in cath lab. Denies cp, sob, n/v,d, or melena. No reported issues with dialysis lately other than some occasional cramping.    Past Medical History:  Diagnosis Date   Allergy    Anemia    Anxiety    Arthritis    "in my joints" (03/10/2017)   Chronic diastolic CHF (congestive heart failure) ([name])    CKD (chronic kidney disease), stage IV ([name])    stage IV. previous HD, none currently 10/30/15 (confirmed 02/05/2017 & 03/10/2017)   Depression    Chronic   Diabetic peripheral neuropathy ([place])    Dysrhythmia    tachycardia, normal ECHO 08-09-14   GERD (gastroesophageal reflux disease)    Heart murmur     dx'd 02/05/2017   Hepatitis C    "tx'd in 2016; I'm negative now" (02/05/2017)   History of blood transfusion 2017   "w/knee replacement"   Hyperlipidemia    Hypertension    Osteoarthritis of left knee    Peripheral neuropathy    Protein calorie malnutrition ([name])    Septic arthritis ([name])    Slow transit constipation    Symptomatic anemia 01/27/2020   Type II diabetes mellitus ([place])    IDDM   Uncontrolled hypertension 02/18/2013   Unsteady gait    Past Surgical History:  Procedure Laterality Date   A/V FISTULAGRAM Right 07/07/2018    Procedure: A/V FISTULAGRAM;  Surgeon: Marty Heck, MD;  Location: [place] [facility];  Service: Cardiovascular;  Laterality: Right;   AMPUTATION Left 03/20/2017   Procedure: LEFT ABOVE KNEE AMPUTATION;  Surgeon: Newt Minion, MD;  Location: [place];  Service: Orthopedics;  Laterality: Left;   APPENDECTOMY     AV FISTULA PLACEMENT Left 09/13/2014   Procedure: Brachial Artery to Brachial Vein Gortex Four - Seven Stretch GRAFT INSERTION Left Forearm;  Surgeon: Mal Misty, MD;  Location: [place];  Service: Vascular;  Laterality: Left;   [place] Right 10/26/2017   Procedure: BASILIC VEIN TRANSPOSITION FIRST STAGE RIGHT ARM;  Surgeon: Conrad [name], MD;  Location: [place];  Service: Vascular;  Laterality: Right;   [place] Right 02/01/2018   Procedure: SECOND STAGE BASILIC VEIN TRANSPOSITION RIGHT UPPER EXTREMITY;  Surgeon: Marty Heck, MD;  Location: [place];  Service: Vascular;  Laterality: Right;   CHOLECYSTECTOMY OPEN     COLON SURGERY     COLONOSCOPY     EXCISIONAL TOTAL KNEE ARTHROPLASTY WITH ANTIBIOTIC SPACERS Left 02/06/2017   Procedure: Incisional total iknee with antibiotic spacer ;  Surgeon: Leandrew Koyanagi, MD;  Location: [place];  Service: Orthopedics;  Laterality: Left;   IR FLUORO GUIDE CV LINE RIGHT  02/10/2017   IR REMOVAL TUN CV CATH W/O FL  04/01/2017   IR US GUIDE VASC ACCESS RIGHT  02/10/2017   IRRIGATION AND DEBRIDEMENT KNEE Left  03/12/2017   Procedure: IRRIGATION AND DEBRIDEMENT LEFT KNEE WITH WOUND VAC APPLICATION;  Surgeon: Leandrew Koyanagi, MD;  Location: [place];  Service: Orthopedics;  Laterality: Left;   JOINT REPLACEMENT     KNEE ARTHROSCOPY Right 08/10/2014   Procedure: ARTHROSCOPY I & D KNEE;  Surgeon: Marianna Payment, MD;  Location: [facility];  Service: Orthopedics;  Laterality: Right;   KNEE ARTHROSCOPY Left 08/11/2014   Procedure: ARTHROSCOPIC WASHOUT LEFT KNEE;  Surgeon: Marianna Payment, MD;  Location: [place];   Service: Orthopedics;  Laterality: Left;   KNEE ARTHROSCOPY Left 08/19/2014   Procedure: ARTHROSCOPIC WASHOUT LEFT KNEE;  Surgeon: Leandrew Koyanagi, MD;  Location: [place];  Service: Orthopedics;  Laterality: Left;   KNEE ARTHROSCOPY WITH LATERAL MENISECTOMY Left 04/04/2015   Procedure: AND PARTIAL LATERAL MENISECTOMY;  Surgeon: Leandrew Koyanagi, MD;  Location: [place];  Service: Orthopedics;  Laterality: Left;   KNEE ARTHROSCOPY WITH MEDIAL MENISECTOMY Left 04/04/2015   Procedure: LEFT KNEE ARTHROSCOPY WITH PARTIAL MEDIAL MENISCECTOMY  AND SYNOVECTOMY;  Surgeon: Leandrew Koyanagi, MD;  Location: [place];  Service: Orthopedics;  Laterality: Left;   PERIPHERAL VASCULAR BALLOON ANGIOPLASTY  07/07/2018   Procedure: PERIPHERAL VASCULAR BALLOON ANGIOPLASTY;  Surgeon: Marty Heck, MD;  Location: [place] [facility];  Service: Cardiovascular;;  right AV fistula   POLYPECTOMY     SHOULDER ARTHROSCOPY Bilateral 08/10/2014   Procedure: I & D BILATERAL SHOULDERS ;  Surgeon: Marianna Payment, MD;  Location: [facility];  Service: Orthopedics;  Laterality: Bilateral;   SMALL INTESTINE SURGERY     Due to Small Bowel Obstruction; "fixed it when they did my gallbladder OR"   TEE WITHOUT CARDIOVERSION N/A 08/14/2014   Procedure: TRANSESOPHAGEAL ECHOCARDIOGRAM (TEE);  Surgeon: Thayer Headings, MD;  Location: [place];  Service: Cardiovascular;  Laterality: N/A;   TENOSYNOVECTOMY Right 08/11/2014   Procedure: RIGHT WRIST IRRIGATION AND DEBRIDEMENT, TENOSYNOVECTOMY;  Surgeon: Marianna Payment, MD;  Location: [place];  Service: Orthopedics;  Laterality: Right;   TOTAL KNEE ARTHROPLASTY Left 11/07/2015   Procedure: LEFT TOTAL KNEE ARTHROPLASTY WITH REVISION OF IMPLANTS;  Surgeon: Leandrew Koyanagi, MD;  Location: [place];  Service: Orthopedics;  Laterality: Left;   TUBAL LIGATION     Family History  Problem Relation Age of Onset   Uterine cancer Mother    Heart disease Father     Hypertension Sister    Uterine cancer Sister    Cancer Brother        unsure type   Diabetes Brother    Hypertension Brother    Colon cancer Neg Hx    Colon polyps Neg Hx    Esophageal cancer Neg Hx    Rectal cancer Neg Hx    Stomach cancer Neg Hx    Social History:  reports that she has been smoking cigarettes. She has a 17.50 pack-year smoking history. She has never used smokeless tobacco. She reports previous alcohol use. She reports that she does not use drugs. Allergies  Allergen Reactions   Compazine [Prochlorperazine] Shortness Of Breath, Swelling and Other (See Comments)    TONGUE SWELLS   Shellfish-Derived Products Anaphylaxis   Iodinated Diagnostic Agents Hives and Rash   Omnipaque [Iohexol] Hives   Tape     Brown paper tape caused skin irritation   Sulfa Antibiotics Rash   Prior to Admission medications   Medication Sig Start Date End Date Taking? Authorizing Provider  Accu-Chek Softclix Lancets lancets Check sugars  three times a day for E11.65 09/20/18  Yes [name], MD  acetaminophen (TYLENOL) 500 MG tablet Take 500-1,000 mg by mouth every 6 (six) hours as needed (for pain.).   Yes [provider]  aspirin EC 81 MG tablet Take 81 mg by mouth daily.   Yes [provider]  atorvastatin (LIPITOR) 10 MG tablet TAKE 1 TABLET BY MOUTH AT BEDTIME . APPOINTMENT REQUIRED FOR FUTURE REFILLS Patient taking differently: Take 10 mg by mouth at bedtime.  10/31/19  Yes [name], MD  blood glucose meter kit and supplies KIT Dispense based on patient and insurance preference. Use up to four times daily as directed 10/13/19  Yes [name], MD  Blood Glucose Monitoring Suppl (ACCU-CHEK GUIDE) w/Device KIT 1 Device by Does not apply route daily. 10/31/19  Yes [name], MD  calcium acetate (PHOSLO) 667 MG capsule Take 1,334-2,001 mg by mouth See admin instructions. Take 3 capsules (2001 mg) by mouth with breakfast, take 2 capsules (1334  mg) by mouth with lunch, take 3 capsules (2001 mg) by mouth with supper & take 2 capsules (1334 mg) by mouth with snacks. 01/13/19  Yes [provider]  carvedilol (COREG) 3.125 MG tablet Take 1 tablet (3.125 mg total) by mouth 2 (two) times daily. 04/04/20 07/03/20 Yes Donato Heinz, MD  cloNIDine (CATAPRES) 0.1 MG tablet Take 0.1 mg by mouth every 12 (twelve) hours. 03/29/20  Yes [provider]  diphenhydrAMINE (BENADRYL) 25 mg capsule Take 25 mg by mouth every 6 (six) hours as needed for allergies.   Yes [provider]  gabapentin (NEURONTIN) 100 MG capsule Take 200 mg by mouth See admin instructions. Take 2 capsules (200 mg) by mouth 3 times on Sundays, Mondays, Wednesdays, & Fridays. 11/01/18  Yes [provider]  gabapentin (NEURONTIN) 300 MG capsule Take 300 mg by mouth See admin instructions. Take 1 capsule (300 mg) by mouth twice daily on Tuesdays, Thursdays, & Saturdays.   Yes [provider]  glucose blood (ACCU-CHEK GUIDE) test strip Check blood sugar up to 4 times a day 10/20/19  Yes [name], MD  hydrALAZINE (APRESOLINE) 100 MG tablet Take 100 mg by mouth in the morning and at bedtime.    Yes [provider]  Lancet Devices [facility]) lancets Check sugars TID for E11.65 01/29/15  Yes Chari Manning A, NP  lidocaine-prilocaine (EMLA) cream Apply 1 application topically Every Tuesday,Thursday,and Saturday with dialysis. On days of dialysis 3x week 10/25/18  Yes [provider]  methocarbamol (ROBAXIN) 500 MG tablet Take 500 mg by mouth every 8 (eight) hours as needed for muscle spasms.  06/08/19  Yes [provider]  Methoxy PEG-Epoetin Beta (MIRCERA IJ) Get it at dialysis 06/30/19 06/28/20 Yes [provider]  multivitamin (RENA-VIT) TABS tablet Take 1 tablet by mouth daily. 03/01/19  Yes [provider]  pantoprazole (PROTONIX) 20 MG tablet Take 20 mg by mouth 2 (two) times daily.  03/29/20  Yes [provider]  polyethylene glycol (MIRALAX / GLYCOLAX) 17 g packet Take 17 g by mouth daily as needed for moderate constipation. 12/10/18  Yes [name], MD  sertraline (ZOLOFT) 100 MG tablet Take 1 tablet by mouth once daily Patient taking differently: Take 100 mg by mouth every evening.  03/26/20  Yes [name], MD  traZODone (DESYREL) 50 MG tablet Take 50 mg by mouth at bedtime as needed for sleep.  10/27/18  Yes [provider]  nystatin (MYCOSTATIN) 100000 UNIT/ML suspension 200,000 units  Take 2 cc by mouth qid x 5 days Patient not taking: Reported on 04/06/2020 02/08/20   Mauri Pole, MD  predniSONE (DELTASONE) 50 MG tablet Take 1 tablet 13 hours prior to procedure, 1 tablet 7 hours prior to procedure, and 1 tablet (with Benedryl 50 mg) prior to going to the hospital for procedure Patient not taking: Reported on 04/06/2020 04/06/20   Donato Heinz, MD   Current Facility-Administered Medications  Medication Dose Route Frequency Provider Last Rate Last Admin   0.9 %  sodium chloride infusion  250 mL Intravenous PRN Donato Heinz, MD       0.9 %  sodium chloride infusion   Intravenous Continuous Donato Heinz, MD       0.9 %  sodium chloride infusion  250 mL Intravenous PRN Burnell Blanks, MD       [START ON 04/14/2020] aspirin chewable tablet 81 mg  81 mg Oral Lizabeth Leyden, MD       carvedilol (COREG) tablet 3.125 mg  3.125 mg Oral BID Burnell Blanks, MD       cloNIDine (CATAPRES) tablet 0.1 mg  0.1 mg Oral Q12H Burnell Blanks, MD       gabapentin (NEURONTIN) capsule 200 mg  200 mg Oral 3 times per day on Sun Mon Wed Fri Burnell Blanks, MD       [START ON 04/14/2020] gabapentin (NEURONTIN) capsule 300 mg  300 mg Oral 2 times per day on Tue Thu Sat Burnell Blanks, MD       hydrALAZINE (APRESOLINE) injection 10 mg  10 mg Intravenous Q20 Min  PRN Burnell Blanks, MD       insulin aspart (novoLOG) injection 0-5 Units  0-5 Units Subcutaneous QHS Burnell Blanks, MD       insulin aspart (novoLOG) injection 0-6 Units  0-6 Units Subcutaneous TID WC Burnell Blanks, MD       labetalol (NORMODYNE) injection 10 mg  10 mg Intravenous Q10 min PRN Burnell Blanks, MD   10 mg at 04/13/20 1258   methocarbamol (ROBAXIN) tablet 500 mg  500 mg Oral Q8H PRN Burnell Blanks, MD       morphine 2 MG/ML injection 2 mg  2 mg Intravenous Q1H PRN Burnell Blanks, MD   2 mg at 04/13/20 1224   multivitamin (RENA-VIT) tablet 1 tablet  1 tablet Oral Daily Burnell Blanks, MD       sodium chloride flush (NS) 0.9 % injection 3 mL  3 mL Intravenous PRN Donato Heinz, MD         ROS: As per HPI otherwise negative.  Physical Exam: Vitals:   04/13/20 1250 04/13/20 1255 04/13/20 1300 04/13/20 1305  BP: (!) 187/74 (!) 185/72 (!) 187/69 (!) 175/64  Pulse: 73 72 67 68  Resp: 11 16 (!) 22 (!) 22  Temp:      SpO2: 94% 97% 98% 95%  Weight:      Height:         General: WDWN woman, nad  Head: NCAT sclera not icteric Neck: Supple. No JVD appreciated  Lungs: CTA bilaterally without wheezes, rales, or rhonchi. Breathing is unlabored. Heart: RRR with S1 S2 Abdomen: soft non-tender  Lower extremities: L AKA, no LE edema or open wounds  Neuro: A & O  X 3. Moves all extremities spontaneously. Psych:  Responds to questions appropriately with a  normal affect. Dialysis Access: RUE AVF +bruit   Labs: Basic Metabolic Panel: No results for input(s): NA, K, CL, CO2, GLUCOSE, BUN, CREATININE, CALCIUM, PHOS in the last 168 hours.  Invalid input(s): ALB Liver Function Tests: No results for input(s): AST, ALT, ALKPHOS, BILITOT, PROT, ALBUMIN in the last 168 hours. No results for input(s): LIPASE, AMYLASE in the last 168 hours. No results for input(s): AMMONIA in the last 168 hours. CBC: No results  for input(s): WBC, NEUTROABS, HGB, HCT, MCV, PLT in the last 168 hours. Cardiac Enzymes: No results for input(s): CKTOTAL, CKMB, CKMBINDEX, TROPONINI in the last 168 hours. CBG: Recent Labs  Lab 04/13/20 0658 04/13/20 1135  GLUCAP 147* 146*   Iron Studies: No results for input(s): IRON, TIBC, TRANSFERRIN, FERRITIN in the last 72 hours. Studies/Results: CARDIAC CATHETERIZATION  Result Date: 04/13/2020  Prox RCA lesion is 50% stenosed.  Prox Cx to Mid Cx lesion is 99% stenosed.  Ost LAD to Prox LAD lesion is 70% stenosed.  Dist Cx lesion is 60% stenosed.  Ramus lesion is 90% stenosed.  1. Severe proximal LAD stenosis (DFR 0.88) 2. Severe ostial ramus intermediate stenosis 3. Severe proximal Circumflex stenosis. The proximal stenotic segment is heavily calcified and in a bend in the vessel with more calcific disease in the mid vessel. 4. The RCA is a large dominant artery with moderate proximal stenosis. 5. Elevated right and left heart pressures. Recommendations: She has three vessel CAD. The large Circumflex is heavily calcified with the most severe lesion in a sharp bend in the vessel. PCI of this vessel would require orbital atherectomy and I do not feel that this would be safe given the bend in the vessel. The proximal LAD lesion is significant by pressure wire analysis. The large intermediate branch has severe disease. Will call CT surgery for CABG consult. She also has aortic valve disease which will need to be addressed. Admit to telemetry. Continue ASA, beta blocker and statin.    Dialysis Orders:  [facility] TTS 4h 450/800 EDW 59.5kg 2K/2Ca  UFP 4 R AVF No heparin Venofer 100 IV x 10 (until 11/13) Mircera 225 q 2 weeks (last 10/28) Calcitriol 1.25 TIW   Assessment/Plan: 1. Severe multivessel CAD -- 3V disease. Not amenable for PCI -per cardiology  CTS consult for possible CABG 2. ICM/EF 30-35% 3. Severe TR/mod AS  4. ESRD -  HD TTS. No urgent dialysis needs today. HD 10/30 on  schedule  5. Hypertension/volume  - BP elevated. Continue home meds. UF 3-4L with HD.  6. Anemia  - Outpatient Hgb 8.2. On max ESA -just dosed as outpatient. Follow trends.  7. Metabolic bone disease -  Continue binders/calcitriol. Check labs with HD tomorrow if not done prior.  8. Hx GI bleed -EGD with [facility] in 01/2020. Scheduled for endoscopy with [facility] on 05/14/20  9. DMT2  Lynnda Child PA-C [facility] 04/13/2020, 1:09 PM   I have seen and examined this patient and agree with the plan of care. Here for cath which unfortunately was not amenable to PCI for the 3V disease and now undergoing evaluation for a CABG. Per Dr. Prescott Gum she would be high risk for CABG and PCI, medical therapy and smoking cessation was recommended. High risk because of her decreased cardiac function, pulmonary HTN and valve disease + poor saphenous.   No absolute indication for dialysis today and will plan on Sat to maintain TTS regimen.  Dwana Melena, MD 04/13/2020, 5:13 PM

## 2020-04-13 NOTE — Interval H&P Note (Signed)
History and Physical Interval Note:  04/13/2020 9:31 AM  Brandy Houston  has presented today for surgery, with the diagnosis of heart failure.  The various methods of treatment have been discussed with the patient and family. After consideration of risks, benefits and other options for treatment, the patient has consented to  Procedure(s): RIGHT/LEFT HEART CATH AND CORONARY ANGIOGRAPHY (N/A) as a surgical intervention.  [name] history has been reviewed, patient examined, no change in status, stable for surgery.  I have reviewed the patient's chart and labs.  Questions were answered to the patient's satisfaction.    Cath Lab Visit (complete for each Cath Lab visit)  Clinical Evaluation Leading to the Procedure:   ACS: No.  Non-ACS:    Anginal Classification: CCS III  Anti-ischemic medical therapy: Minimal Therapy (1 class of medications)  Non-Invasive Test Results: No non-invasive testing performed  Prior CABG: No previous CABG        Lauree Chandler

## 2020-04-13 NOTE — Progress Notes (Signed)
TCTS consulted for CABG evaluation. °

## 2020-04-14 ENCOUNTER — Encounter (HOSPITAL_COMMUNITY): Payer: Medicare Other

## 2020-04-14 ENCOUNTER — Other Ambulatory Visit (HOSPITAL_COMMUNITY): Payer: Medicare Other

## 2020-04-14 DIAGNOSIS — N186 End stage renal disease: Secondary | ICD-10-CM

## 2020-04-14 DIAGNOSIS — I5041 Acute combined systolic (congestive) and diastolic (congestive) heart failure: Secondary | ICD-10-CM

## 2020-04-14 DIAGNOSIS — I351 Nonrheumatic aortic (valve) insufficiency: Secondary | ICD-10-CM | POA: Diagnosis not present

## 2020-04-14 DIAGNOSIS — I272 Pulmonary hypertension, unspecified: Secondary | ICD-10-CM | POA: Diagnosis not present

## 2020-04-14 DIAGNOSIS — I200 Unstable angina: Secondary | ICD-10-CM | POA: Diagnosis not present

## 2020-04-14 LAB — CBC
HCT: 26 % — ABNORMAL LOW (ref 36.0–46.0)
Hemoglobin: 7.7 g/dL — ABNORMAL LOW (ref 12.0–15.0)
MCH: 28.9 pg (ref 26.0–34.0)
MCHC: 29.6 g/dL — ABNORMAL LOW (ref 30.0–36.0)
MCV: 97.7 fL (ref 80.0–100.0)
Platelets: 163 10*3/uL (ref 150–400)
RBC: 2.66 MIL/uL — ABNORMAL LOW (ref 3.87–5.11)
RDW: 19.4 % — ABNORMAL HIGH (ref 11.5–15.5)
WBC: 4 10*3/uL (ref 4.0–10.5)
nRBC: 0 % (ref 0.0–0.2)

## 2020-04-14 LAB — BASIC METABOLIC PANEL
Anion gap: 9 (ref 5–15)
BUN: 39 mg/dL — ABNORMAL HIGH (ref 8–23)
CO2: 31 mmol/L (ref 22–32)
Calcium: 8.6 mg/dL — ABNORMAL LOW (ref 8.9–10.3)
Chloride: 97 mmol/L — ABNORMAL LOW (ref 98–111)
Creatinine, Ser: 4.63 mg/dL — ABNORMAL HIGH (ref 0.44–1.00)
GFR, Estimated: 10 mL/min — ABNORMAL LOW (ref >60)
Glucose, Bld: 141 mg/dL — ABNORMAL HIGH (ref 70–99)
Potassium: 3.7 mmol/L (ref 3.5–5.1)
Sodium: 137 mmol/L (ref 135–145)

## 2020-04-14 LAB — HEMOGLOBIN A1C
Hgb A1c MFr Bld: 4.6 % — ABNORMAL LOW (ref 4.8–5.6)
Mean Plasma Glucose: 85.32 mg/dL

## 2020-04-14 LAB — GLUCOSE, CAPILLARY
Glucose-Capillary: 113 mg/dL — ABNORMAL HIGH (ref 70–99)
Glucose-Capillary: 149 mg/dL — ABNORMAL HIGH (ref 70–99)
Glucose-Capillary: 153 mg/dL — ABNORMAL HIGH (ref 70–99)
Glucose-Capillary: 222 mg/dL — ABNORMAL HIGH (ref 70–99)
Glucose-Capillary: 98 mg/dL (ref 70–99)

## 2020-04-14 MED ORDER — DIPHENHYDRAMINE HCL 25 MG PO CAPS
25.0000 mg | ORAL_CAPSULE | Freq: Four times a day (QID) | ORAL | Status: DC | PRN
  Administered 2020-04-14: 25 mg via ORAL
  Filled 2020-04-14: qty 1

## 2020-04-14 MED ORDER — SODIUM CHLORIDE 0.9 % IV SOLN
125.0000 mg | INTRAVENOUS | Status: DC
  Administered 2020-04-14: 125 mg via INTRAVENOUS
  Filled 2020-04-14: qty 10

## 2020-04-14 NOTE — Progress Notes (Signed)
Progress Note  Patient Name: Brandy Houston Date of Encounter: 04/14/2020  Primary Cardiologist: Dr. Gardiner Rhyme  Subjective   Currently undergoing dialysis; no chest pain  Inpatient Medications    Scheduled Meds: . aspirin EC  81 mg Oral Daily  . atorvastatin  10 mg Oral QHS  . calcium acetate  2,001 mg Oral BID WC   And  . calcium acetate  1,334 mg Oral Q lunch  . carvedilol  3.125 mg Oral BID WC  . cloNIDine  0.1 mg Oral Q12H  . gabapentin  200 mg Oral 3 times per day on Sun Mon Wed Fri  . gabapentin  300 mg Oral 2 times per day on Tue Thu Sat  . hydrALAZINE  50 mg Oral Q8H  . insulin aspart  0-5 Units Subcutaneous QHS  . insulin aspart  0-6 Units Subcutaneous TID WC  . multivitamin  1 tablet Oral Daily  . pantoprazole  20 mg Oral BID  . sertraline  100 mg Oral QPM  . sodium chloride flush  3 mL Intravenous Q12H  . sodium chloride flush  3 mL Intravenous Q12H   Continuous Infusions: . sodium chloride    . ferric gluconate (FERRLECIT/NULECIT) IV     PRN Meds: sodium chloride, acetaminophen, calcium acetate, methocarbamol, morphine, polyethylene glycol, sodium chloride flush, traZODone   Vital Signs    Vitals:   04/14/20 0039 04/14/20 0412 04/14/20 0528 04/14/20 0747  BP: 130/60 128/65 122/65 (!) 119/53  Pulse: 74 77  71  Resp: 18 15  18   Temp: 98.5 F (36.9 C) 98.5 F (36.9 C)  98.5 F (36.9 C)  TempSrc: Oral Oral  Oral  SpO2: 96% 94%  96%  Weight:      Height:        Intake/Output Summary (Last 24 hours) at 04/14/2020 1309 Last data filed at 04/14/2020 1300 Gross per 24 hour  Intake 120 ml  Output --  Net 120 ml    I/O since admission:   Filed Weights   04/13/20 0701  Weight: 62.6 kg    Telemetry    Sinus - Personally Reviewed  ECG    ECG (independently read by me):  Physical Exam   BP (!) 119/53 (BP Location: Left Arm)   Pulse 71   Temp 98.5 F (36.9 C) (Oral)   Resp 18   Ht 5\' 7"  (1.702 m)   Wt 62.6 kg   LMP 03/16/2002    SpO2 96%   BMI 21.61 kg/m  General: Alert, oriented, no distress.  Skin: normal turgor, no rashes, warm and dry HEENT: Normocephalic, atraumatic. Pupils equal round and reactive to light; sclera anicteric; extraocular muscles intact;  Nose without nasal septal hypertrophy Mouth/Parynx benign; Mallinpatti scale 3 Neck: No JVD, no carotid bruits; normal carotid upstroke Lungs: clear to ausculatation and percussion; no wheezing or rales Chest wall: without tenderness to palpitation Heart: PMI not displaced, RRR, s1 s2 normal, 2/6 mid peakingsystolic murmur, no diastolic murmur, no rubs, gallops, thrills, or heaves Abdomen: soft, nontender; no hepatosplenomehaly, BS+; abdominal aorta nontender and not dilated by palpation. Back: no CVA tenderness Pulses 2+ Musculoskeletal: L AKA:   Extremities: no clubbing cyanosis or edema, Homan's sign negative  Neurologic: grossly nonfocal; Cranial nerves grossly wnl Psychologic: Anxiety   Labs    Chemistry Recent Labs  Lab 04/13/20 1001 04/13/20 1003 04/14/20 0447  NA 136 136 137  K 3.4* 3.5 3.7  CL  --   --  97*  CO2  --   --  31  GLUCOSE  --   --  141*  BUN  --   --  39*  CREATININE  --   --  4.63*  CALCIUM  --   --  8.6*  GFRNONAA  --   --  10*  ANIONGAP  --   --  9     Hematology Recent Labs  Lab 04/13/20 1001 04/13/20 1003 04/14/20 0447  WBC  --   --  4.0  RBC  --   --  2.66*  HGB 9.2* 9.9* 7.7*  HCT 27.0* 29.0* 26.0*  MCV  --   --  97.7  MCH  --   --  28.9  MCHC  --   --  29.6*  RDW  --   --  19.4*  PLT  --   --  163    Cardiac EnzymesNo results for input(s): TROPONINI in the last 168 hours. No results for input(s): TROPIPOC in the last 168 hours.   BNPNo results for input(s): BNP, PROBNP in the last 168 hours.   DDimer No results for input(s): DDIMER in the last 168 hours.   Lipid Panel     Component Value Date/Time   CHOL 111 12/10/2018 1133   TRIG 82.0 12/10/2018 1133   HDL 61.40 12/10/2018 1133   CHOLHDL  2 12/10/2018 1133   VLDL 16.4 12/10/2018 1133   LDLCALC 33 12/10/2018 1133     Radiology    CARDIAC CATHETERIZATION  Result Date: 04/13/2020  Prox RCA lesion is 50% stenosed.  Prox Cx to Mid Cx lesion is 99% stenosed.  Ost LAD to Prox LAD lesion is 70% stenosed.  Dist Cx lesion is 60% stenosed.  Ramus lesion is 90% stenosed.  1. Severe proximal LAD stenosis (DFR 0.88) 2. Severe ostial ramus intermediate stenosis 3. Severe proximal Circumflex stenosis. The proximal stenotic segment is heavily calcified and in a bend in the vessel with more calcific disease in the mid vessel. 4. The RCA is a large dominant artery with moderate proximal stenosis. 5. Elevated right and left heart pressures. Recommendations: She has three vessel CAD. The large Circumflex is heavily calcified with the most severe lesion in a sharp bend in the vessel. PCI of this vessel would require orbital atherectomy and I do not feel that this would be safe given the bend in the vessel. The proximal LAD lesion is significant by pressure wire analysis. The large intermediate branch has severe disease. Will call CT surgery for CABG consult. She also has aortic valve disease which will need to be addressed. Admit to telemetry. Continue ASA, beta blocker and statin.    Cardiac Studies   INTRAVASCULAR PRESSURE WIRE/FFR STUDY  RIGHT/LEFT HEART CATH AND CORONARY ANGIOGRAPHY  Conclusion    Prox RCA lesion is 50% stenosed.  Prox Cx to Mid Cx lesion is 99% stenosed.  Ost LAD to Prox LAD lesion is 70% stenosed.  Dist Cx lesion is 60% stenosed.  Ramus lesion is 90% stenosed.  1. Severe proximal LAD stenosis (DFR 0.88) 2. Severe ostial ramus intermediate stenosis 3. Severe proximal Circumflex stenosis. The proximal stenotic segment is heavily calcified and in a bend in the vessel with more calcific disease in the mid vessel.  4. The RCA is a large dominant artery with moderate proximal stenosis.  5. Elevated right and left  heart pressures.   Recommendations: She has three vessel CAD. The large Circumflex is heavily calcified with the most severe lesion in a sharp bend in the vessel. PCI  of this vessel would require orbital atherectomy and I do not feel that this would be safe given the bend in the vessel. The proximal LAD lesion is significant by pressure wire analysis. The large intermediate branch has severe disease. Will call CT surgery for CABG consult. She also has aortic valve disease which will need to be addressed.  Admit to telemetry. Continue ASA, beta blocker and statin.      ECHO IMPRESSIONS  1. Left ventricular ejection fraction, by estimation, is 30 to 35%. The  left ventricle has moderately decreased function. The left ventricle  demonstrates global hypokinesis. The left ventricular internal cavity size  was mildly dilated. There is moderate  left ventricular hypertrophy. Left ventricular diastolic parameters are  consistent with Grade II diastolic dysfunction (pseudonormalization).  2. Right ventricular systolic function is normal. The right ventricular  size is moderately enlarged. There is severely elevated pulmonary artery  systolic pressure. The estimated right ventricular systolic pressure is  70.1 mmHg.  3. Left atrial size was moderately dilated.  4. Right atrial size was moderately dilated.  5. Moderate pericardial effusion.  6. The mitral valve is grossly normal. Mild to moderate mitral valve  regurgitation. No evidence of mitral stenosis.  7. Tricuspid valve regurgitation is severe.  8. The aortic valve is calcified. Aortic valve regurgitation is moderate.  Mild to moderate aortic valve stenosis.  9. The inferior vena cava is dilated in size with <50% respiratory  variability, suggesting right atrial pressure of 15 mmHg.   Patient Profile     Ms. Abelson is a 64 yo female with history of ESRD on HD, DM, HTN, anemia, septic arthritis s/p Left AKA, aortic valve  insufficiency, tricuspid valve insufficiency, new diagnosis of  ischemic cardiomyopathy and CAD who is being admitted  following diagnostic cardiac cath. She was referred for cath today by Dr. Gardiner Rhyme. Chest pain dating back to June 2020 at which time she had anemia and low risk stress test. Now having chest pain with HD and with moderate exertion. LVEF=30=35% with moderate pericardial effusion, mild to moderate MR, moderate AI, severe TR.   Assessment & Plan    1.  Unstable angina with multivessel CAD and significant coronary calcification.  The patient is currently on heparin anticoagulation.  Dr. Prescott Gum has evaluated the patient and due to significant comorbidities has felt the patient is very high risk for CABG surgery with her pulmonary hypertension, valvular insufficiency, poor conduit availability surgery has been turned down.  We will need to review with colleagues with reference to potential multivessel percutaneous revascularization which would not be ideal in the circumflex  due to a sharp bend in the vessel.  2.  Ischemic cardiomyopathy: EF has decreased now to 30 to 35%.  3.  End-stage renal disease on hemodialysis on Tuesday Thursdays and Saturdays.  Currently undergoing dialysis during my evaluation.  4.  Aortic valve disease with moderate aortic insufficiency and mild to moderate aortic stenosis.  5.  Moderate pericardial effusion noted on echo.  No evidence for tamponade  6.  Severe pulmonary hypertension with estimated PA systolic 74 mm Hg.  Signed, Troy Sine, MD, [facility] 04/14/2020, 1:09 PM

## 2020-04-14 NOTE — Progress Notes (Addendum)
[facility] Progress Note   Subjective:  Seen in room. No CP/SOB. Feeling a little anxious about recent events.  For dialysis today.   Objective Vitals:   04/14/20 0039 04/14/20 0412 04/14/20 0528 04/14/20 0747  BP: 130/60 128/65 122/65 (!) 119/53  Pulse: 74 77  71  Resp: 18 15  18   Temp: 98.5 F (36.9 C) 98.5 F (36.9 C)  98.5 F (36.9 C)  TempSrc: Oral Oral  Oral  SpO2: 96% 94%  96%  Weight:      Height:         Additional Objective Labs: Basic Metabolic Panel: Recent Labs  Lab 04/13/20 1001 04/13/20 1003 04/14/20 0447  NA 136 136 137  K 3.4* 3.5 3.7  CL  --   --  97*  CO2  --   --  31  GLUCOSE  --   --  141*  BUN  --   --  39*  CREATININE  --   --  4.63*  CALCIUM  --   --  8.6*   CBC: Recent Labs  Lab 04/13/20 1001 04/13/20 1003 04/14/20 0447  WBC  --   --  4.0  HGB 9.2* 9.9* 7.7*  HCT 27.0* 29.0* 26.0*  MCV  --   --  97.7  PLT  --   --  163   Blood Culture    Component Value Date/Time   SDES URINE, RANDOM 02/08/2017 1138   SPECREQUEST CX ADDED AT 0327 ON [id number] 02/08/2017 1138   CULT >=100,000 COLONIES/mL YEAST (A) 02/08/2017 1138   REPTSTATUS 02/10/2017 FINAL 02/08/2017 1138     Physical Exam General: Chronically ill appearing, nad  Heart: RRR, harsh systolic murmur  Lungs: Clear, no rales, no wheeze  Abdomen: soft non-tender  Extremities: L AKA, no edema  Dialysis Access: RUE AVF +bruit   Medications: . sodium chloride     . aspirin EC  81 mg Oral Daily  . atorvastatin  10 mg Oral QHS  . calcium acetate  2,001 mg Oral BID WC   And  . calcium acetate  1,334 mg Oral Q lunch  . carvedilol  3.125 mg Oral BID WC  . cloNIDine  0.1 mg Oral Q12H  . gabapentin  200 mg Oral 3 times per day on Sun Mon Wed Fri  . gabapentin  300 mg Oral 2 times per day on Tue Thu Sat  . hydrALAZINE  50 mg Oral Q8H  . insulin aspart  0-5 Units Subcutaneous QHS  . insulin aspart  0-6 Units Subcutaneous TID WC  . multivitamin  1 tablet Oral  Daily  . pantoprazole  20 mg Oral BID  . sertraline  100 mg Oral QPM  . sodium chloride flush  3 mL Intravenous Q12H  . sodium chloride flush  3 mL Intravenous Q12H    Dialysis Orders:  [facility] TTS 4h 450/800 EDW 59.5kg 2K/2Ca  UFP 4 R AVF No heparin Venofer 100 IV x 10 (until 11/13) Mircera 225 q 2 weeks (last 10/28) Calcitriol 1.25 TIW   Assessment/Plan: 1. Severe multivessel CAD -- 3V disease. Not amenable for PCI -per cardiology  CTS consult for possible CABG -- Per. Dr. Nils Pyle -very high risk for CABG- recommend consideration of PCI, medical therapy, smoking cessation.  2. ICM/CHFEF 30-35% 3. Severe TR/mod AS  4. ESRD -  HD TTS. Continue on schedule.  HD today 10/30.  5. Hypertension/volume  - Continue home meds. UF to EDW as tolerated  6. Anemia  -  Hgb 7.7 On max ESA -just dosed as outpatient.Will continue IV Fe bolus here.  7. Metabolic bone disease -  Ca ok. Continue binders/calcitriol.  8. Hx GI bleed -EGD with [facility] in 01/2020. Scheduled for endoscopy with APC on 05/14/20  9. DMT2   Lynnda Child PA-C [facility] 04/14/2020,8:58 AM  I have seen and examined this patient and agree with the plan of care. Unfortunately too high risk for CABG. Planning on RRT today and maintain TTS regimen. No CP this AM at time of exam. Transfuse as needed.  Dwana Melena, MD 04/14/2020, 10:09 AM

## 2020-04-14 NOTE — Plan of Care (Signed)

## 2020-04-15 ENCOUNTER — Encounter (HOSPITAL_COMMUNITY): Payer: Self-pay | Admitting: Cardiovascular Disease

## 2020-04-15 DIAGNOSIS — I5041 Acute combined systolic (congestive) and diastolic (congestive) heart failure: Secondary | ICD-10-CM | POA: Diagnosis not present

## 2020-04-15 DIAGNOSIS — Z992 Dependence on renal dialysis: Secondary | ICD-10-CM

## 2020-04-15 DIAGNOSIS — I350 Nonrheumatic aortic (valve) stenosis: Secondary | ICD-10-CM

## 2020-04-15 DIAGNOSIS — I272 Pulmonary hypertension, unspecified: Secondary | ICD-10-CM | POA: Diagnosis not present

## 2020-04-15 DIAGNOSIS — I200 Unstable angina: Secondary | ICD-10-CM | POA: Diagnosis not present

## 2020-04-15 LAB — GLUCOSE, CAPILLARY
Glucose-Capillary: 110 mg/dL — ABNORMAL HIGH (ref 70–99)
Glucose-Capillary: 123 mg/dL — ABNORMAL HIGH (ref 70–99)
Glucose-Capillary: 134 mg/dL — ABNORMAL HIGH (ref 70–99)
Glucose-Capillary: 185 mg/dL — ABNORMAL HIGH (ref 70–99)
Glucose-Capillary: 188 mg/dL — ABNORMAL HIGH (ref 70–99)

## 2020-04-15 MED ORDER — NICOTINE 14 MG/24HR TD PT24
14.0000 mg | MEDICATED_PATCH | Freq: Every day | TRANSDERMAL | Status: DC
  Administered 2020-04-15 – 2020-04-16 (×2): 14 mg via TRANSDERMAL
  Filled 2020-04-15 (×2): qty 1

## 2020-04-15 NOTE — Plan of Care (Signed)

## 2020-04-15 NOTE — Progress Notes (Signed)
Progress Note  Patient Name: Brandy Houston Date of Encounter: 04/15/2020  Primary Cardiologist: Dr. Gardiner Rhyme  Subjective   No recurrent chest pain  Inpatient Medications    Scheduled Meds: . aspirin EC  81 mg Oral Daily  . atorvastatin  10 mg Oral QHS  . calcium acetate  2,001 mg Oral BID WC   And  . calcium acetate  1,334 mg Oral Q lunch  . carvedilol  3.125 mg Oral BID WC  . cloNIDine  0.1 mg Oral Q12H  . gabapentin  200 mg Oral 3 times per day on Sun Mon Wed Fri  . gabapentin  300 mg Oral 2 times per day on Tue Thu Sat  . hydrALAZINE  50 mg Oral Q8H  . insulin aspart  0-5 Units Subcutaneous QHS  . insulin aspart  0-6 Units Subcutaneous TID WC  . multivitamin  1 tablet Oral Daily  . nicotine  14 mg Transdermal Daily  . pantoprazole  20 mg Oral BID  . sertraline  100 mg Oral QPM  . sodium chloride flush  3 mL Intravenous Q12H  . sodium chloride flush  3 mL Intravenous Q12H   Continuous Infusions: . sodium chloride    . ferric gluconate (FERRLECIT/NULECIT) IV Stopped (04/14/20 1725)   PRN Meds: sodium chloride, acetaminophen, calcium acetate, diphenhydrAMINE, methocarbamol, morphine, polyethylene glycol, sodium chloride flush, traZODone   Vital Signs    Vitals:   04/14/20 2208 04/15/20 0508 04/15/20 0603 04/15/20 0723  BP: (!) 142/65 (!) 149/62 (!) 143/65 (!) 161/74  Pulse: 74 75 80 77  Resp: 20 19 16    Temp: 97.7 F (36.5 C) 97.9 F (36.6 C) 98.6 F (37 C) 98.7 F (37.1 C)  TempSrc: Oral Oral Oral Oral  SpO2: 97% 100% 98% 95%  Weight:  56.2 kg    Height:        Intake/Output Summary (Last 24 hours) at 04/15/2020 1039 Last data filed at 04/15/2020 0954 Gross per 24 hour  Intake 320 ml  Output 3880 ml  Net -3560 ml    I/O since admission: -[place]   04/14/20 1315 04/14/20 1729 04/15/20 0508  Weight: 59.7 kg 56.1 kg 56.2 kg    Telemetry    Sinus in th 70s- Personally Reviewed  ECG    ECG (independently read by me): Normal  sinus rhythm at 76, mild T wave abnormality in leads II, aVF, V4 to V6 and lead I  Physical Exam   BP (!) 161/74 (BP Location: Left Arm)   Pulse 77   Temp 98.7 F (37.1 C) (Oral)   Resp 16   Ht 5\' 7"  (1.702 m)   Wt 56.2 kg   LMP 03/16/2002   SpO2 95%   BMI 19.41 kg/m  General: Alert, oriented, no distress.  Skin: normal turgor, no rashes, warm and dry HEENT: Normocephalic, atraumatic. Pupils equal round and reactive to light; sclera anicteric; extraocular muscles intact;  Nose without nasal septal hypertrophy Mouth/Parynx benign; Mallinpatti scale 3 Neck: No JVD, no carotid bruits; normal carotid upstroke Lungs: clear to ausculatation and percussion; no wheezing or rales Chest wall: without tenderness to palpitation Heart: PMI not displaced, RRR, s1 s2 normal, 2/6 mid peaking systolic murmur c/w AS, no diastolic murmur, no rubs, gallops, thrills, or heaves Abdomen: soft, nontender; no hepatosplenomehaly, BS+; abdominal aorta nontender and not dilated by palpation. Back: no CVA tenderness Pulses 2+ Musculoskeletal: full range of motion, normal strength, no joint deformities Extremities: L AKA; no clubbing cyanosis ,  Homan's sign negative  Neurologic: grossly nonfocal; Cranial nerves grossly wnl Psychologic: has anxiety   Labs    Chemistry Recent Labs  Lab 04/13/20 1001 04/13/20 1003 04/14/20 0447  NA 136 136 137  K 3.4* 3.5 3.7  CL  --   --  97*  CO2  --   --  31  GLUCOSE  --   --  141*  BUN  --   --  39*  CREATININE  --   --  4.63*  CALCIUM  --   --  8.6*  GFRNONAA  --   --  10*  ANIONGAP  --   --  9     Hematology Recent Labs  Lab 04/13/20 1001 04/13/20 1003 04/14/20 0447  WBC  --   --  4.0  RBC  --   --  2.66*  HGB 9.2* 9.9* 7.7*  HCT 27.0* 29.0* 26.0*  MCV  --   --  97.7  MCH  --   --  28.9  MCHC  --   --  29.6*  RDW  --   --  19.4*  PLT  --   --  163    Cardiac EnzymesNo results for input(s): TROPONINI in the last 168 hours. No results for  input(s): TROPIPOC in the last 168 hours.   BNPNo results for input(s): BNP, PROBNP in the last 168 hours.   DDimer No results for input(s): DDIMER in the last 168 hours.   Lipid Panel     Component Value Date/Time   CHOL 111 12/10/2018 1133   TRIG 82.0 12/10/2018 1133   HDL 61.40 12/10/2018 1133   CHOLHDL 2 12/10/2018 1133   VLDL 16.4 12/10/2018 1133   LDLCALC 33 12/10/2018 1133     Radiology    No results found.  Cardiac Studies   INTRAVASCULAR PRESSURE WIRE/FFR STUDY  RIGHT/LEFT HEART CATH AND CORONARY ANGIOGRAPHY  Conclusion    Prox RCA lesion is 50% stenosed.  Prox Cx to Mid Cx lesion is 99% stenosed.  Ost LAD to Prox LAD lesion is 70% stenosed.  Dist Cx lesion is 60% stenosed.  Ramus lesion is 90% stenosed.  1. Severe proximal LAD stenosis (DFR 0.88) 2. Severe ostial ramus intermediate stenosis 3. Severe proximal Circumflex stenosis. The proximal stenotic segment is heavily calcified and in a bend in the vessel with more calcific disease in the mid vessel.  4. The RCA is a large dominant artery with moderate proximal stenosis.  5. Elevated right and left heart pressures.   Recommendations: She has three vessel CAD. The large Circumflex is heavily calcified with the most severe lesion in a sharp bend in the vessel. PCI of this vessel would require orbital atherectomy and I do not feel that this would be safe given the bend in the vessel. The proximal LAD lesion is significant by pressure wire analysis. The large intermediate branch has severe disease. Will call CT surgery for CABG consult. She also has aortic valve disease which will need to be addressed.  Admit to telemetry. Continue ASA, beta blocker and statin.      ECHO IMPRESSIONS  1. Left ventricular ejection fraction, by estimation, is 30 to 35%. The  left ventricle has moderately decreased function. The left ventricle  demonstrates global hypokinesis. The left ventricular internal cavity size   was mildly dilated. There is moderate  left ventricular hypertrophy. Left ventricular diastolic parameters are  consistent with Grade II diastolic dysfunction (pseudonormalization).  2. Right ventricular systolic function is  normal. The right ventricular  size is moderately enlarged. There is severely elevated pulmonary artery  systolic pressure. The estimated right ventricular systolic pressure is  03.1 mmHg.  3. Left atrial size was moderately dilated.  4. Right atrial size was moderately dilated.  5. Moderate pericardial effusion.  6. The mitral valve is grossly normal. Mild to moderate mitral valve  regurgitation. No evidence of mitral stenosis.  7. Tricuspid valve regurgitation is severe.  8. The aortic valve is calcified. Aortic valve regurgitation is moderate.  Mild to moderate aortic valve stenosis.  9. The inferior vena cava is dilated in size with <50% respiratory  variability, suggesting right atrial pressure of 15 mmHg.   Patient Profile     Ms. Tenorio is a 64 yo female with history of ESRD on HD, DM, HTN, anemia, septic arthritis s/p Left AKA, aortic valve insufficiency, tricuspid valve insufficiency, new diagnosis of  ischemic cardiomyopathy and CAD who is being admitted  following diagnostic cardiac cath. She was referred for cath today by Dr. Gardiner Rhyme. Chest pain dating back to June 2020 at which time she had anemia and low risk stress test. Now having chest pain with HD and with moderate exertion. LVEF=30=35% with moderate pericardial effusion, mild to moderate MR, moderate AI, severe TR.   Assessment & Plan    1.  Unstable angina with multivessel CAD with significant coronary calcification.  The patient is currently on heparin anticoagulation.  Dr. Prescott Gum has evaluated the patient and due to significant comorbidities has felt the patient is very high risk for CABG surgery with her pulmonary hypertension, valvular insufficiency, and poor conduit availability.   She has been turned down for surgery. Will need to review with colleagues with reference to potential multivessel percutaneous revascularization which would most likely require orbital atherectomy but with significant vessel angle involving the circumflex may able to do the LCX without significant increased risk.  We will repeat labs in a.m. Will initiate amlodipine 2.5 mg for additional anti-anginal/ischemic benefit.  2.  Ischemic cardiomyopathy: EF has decreased now to 30 to 35% on most recent echo with moderate LVH, grade 2 diastolic dysfunction, moderate biatrial enlargement, mild to moderate MR, mild to moderate aortic stenosis with moderate AR and severe TR with moderate pericardial effusion. Echo in October 2020 showed an EF of 60 to 65% with moderate LVH.  3.  End-stage renal disease on hemodialysis on Tuesday Thursdays and Saturdays.  Tolerated dialysis yesterday.  4.  Aortic valve disease with moderate aortic insufficiency and mild to moderate aortic stenosis.  5.  Moderate pericardial effusion noted on echo.  No evidence for tamponade  6.  Severe pulmonary hypertension with estimated PA systolic 74 mm Hg.  We will ask interventionalist to review potential percutaneous options since patient was turned down for CABG revascularization versus medical therapy.  Signed, Troy Sine, MD, [facility] 04/15/2020, 10:39 AM

## 2020-04-15 NOTE — Progress Notes (Addendum)
[facility] Progress Note   Subjective:  Completed dialysis yesterday -net UF 3.8L. Had some cramping in legs Seen in room. Denies CP, SOB Anxious, asking for nicotine patch   Objective Vitals:   04/14/20 2208 04/15/20 0508 04/15/20 0603 04/15/20 0723  BP: (!) 142/65 (!) 149/62 (!) 143/65 (!) 161/74  Pulse: 74 75 80 77  Resp: 20 19 16    Temp: 97.7 F (36.5 C) 97.9 F (36.6 C) 98.6 F (37 C) 98.7 F (37.1 C)  TempSrc: Oral Oral Oral Oral  SpO2: 97% 100% 98% 95%  Weight:  56.2 kg    Height:         Additional Objective Labs: Basic Metabolic Panel: Recent Labs  Lab 04/13/20 1001 04/13/20 1003 04/14/20 0447  NA 136 136 137  K 3.4* 3.5 3.7  CL  --   --  97*  CO2  --   --  31  GLUCOSE  --   --  141*  BUN  --   --  39*  CREATININE  --   --  4.63*  CALCIUM  --   --  8.6*   CBC: Recent Labs  Lab 04/13/20 1001 04/13/20 1003 04/14/20 0447  WBC  --   --  4.0  HGB 9.2* 9.9* 7.7*  HCT 27.0* 29.0* 26.0*  MCV  --   --  97.7  PLT  --   --  163   Blood Culture    Component Value Date/Time   SDES URINE, RANDOM 02/08/2017 1138   SPECREQUEST CX ADDED AT 0327 ON [id number] 02/08/2017 1138   CULT >=100,000 COLONIES/mL YEAST (A) 02/08/2017 1138   REPTSTATUS 02/10/2017 FINAL 02/08/2017 1138     Physical Exam General: Chronically ill appearing, nad  Heart: RRR, harsh systolic murmur  Lungs: Clear, no rales, no wheeze  Abdomen: soft non-tender  Extremities: L AKA, no edema  Dialysis Access: RUE AVF +bruit   Medications: . sodium chloride    . ferric gluconate (FERRLECIT/NULECIT) IV Stopped (04/14/20 1725)   . aspirin EC  81 mg Oral Daily  . atorvastatin  10 mg Oral QHS  . calcium acetate  2,001 mg Oral BID WC   And  . calcium acetate  1,334 mg Oral Q lunch  . carvedilol  3.125 mg Oral BID WC  . cloNIDine  0.1 mg Oral Q12H  . gabapentin  200 mg Oral 3 times per day on Sun Mon Wed Fri  . gabapentin  300 mg Oral 2 times per day on Tue Thu Sat  .  hydrALAZINE  50 mg Oral Q8H  . insulin aspart  0-5 Units Subcutaneous QHS  . insulin aspart  0-6 Units Subcutaneous TID WC  . multivitamin  1 tablet Oral Daily  . pantoprazole  20 mg Oral BID  . sertraline  100 mg Oral QPM  . sodium chloride flush  3 mL Intravenous Q12H  . sodium chloride flush  3 mL Intravenous Q12H    Dialysis Orders:  [facility] TTS 4h 450/800 EDW 59.5kg 2K/2Ca  UFP 4 R AVF No heparin Venofer 100 IV x 10 (until 11/13) Mircera 225 q 2 weeks (last 10/28) Calcitriol 1.25 TIW   Assessment/Plan: 1. Severe multivessel CAD   -- 3V disease s/p  R/L heart cath 10/29.  Not amenable for PCI -per cardiology  CTS consult for possible CABG -- Per. Dr. Nils Pyle -very high risk for CABG- recommend consideration of PCI, medical therapy, smoking cessation.  2. ICM/ EF 30-35% 3. Severe TR/mod  AS  4. ESRD -  HD TTS. Continue on schedule.  Next HD 11/2.  5. Hypertension/volume  - Continue home meds. Net UF 3.8L on 10/30. Below EDW by weights here.  6. Anemia  - Hgb 7.7 On max ESA -just dosed as outpatient.Will continue IV Fe bolus here. Transfuse prn 7. Metabolic bone disease -  Ca ok. Continue binders/calcitriol.  8. Hx GI bleed -EGD with [facility] in 01/2020. Scheduled for endoscopy with [facility] on 05/14/20  9. Pulm HTN  10. DMT2   Lynnda Child PA-C [facility] 04/15/2020,8:34 AM  I have seen and examined this patient and agree with the plan of care. Patient to speak with cardiology tomorrow to discuss options. No absolute indication for RRT and she is comfortable. No further CP overnight; some leg cramping and upper abd pain on HD yesterday.  Next HD Tues if she's still here.   Dwana Melena, MD 04/15/2020, 9:50 AM

## 2020-04-16 ENCOUNTER — Telehealth: Payer: Self-pay

## 2020-04-16 ENCOUNTER — Encounter (HOSPITAL_COMMUNITY): Payer: Self-pay | Admitting: Cardiovascular Disease

## 2020-04-16 DIAGNOSIS — E1129 Type 2 diabetes mellitus with other diabetic kidney complication: Secondary | ICD-10-CM | POA: Diagnosis not present

## 2020-04-16 DIAGNOSIS — N186 End stage renal disease: Secondary | ICD-10-CM | POA: Diagnosis not present

## 2020-04-16 DIAGNOSIS — Z992 Dependence on renal dialysis: Secondary | ICD-10-CM | POA: Diagnosis not present

## 2020-04-16 DIAGNOSIS — I200 Unstable angina: Secondary | ICD-10-CM | POA: Diagnosis not present

## 2020-04-16 LAB — LIPID PANEL
Cholesterol: 122 mg/dL (ref 0–200)
HDL: 83 mg/dL (ref >40)
LDL Cholesterol: 23 mg/dL (ref 0–99)
Total CHOL/HDL Ratio: 1.5 RATIO
Triglycerides: 82 mg/dL (ref <150)
VLDL: 16 mg/dL (ref 0–40)

## 2020-04-16 LAB — COMPREHENSIVE METABOLIC PANEL
ALT: 22 U/L (ref 0–44)
AST: 14 U/L — ABNORMAL LOW (ref 15–41)
Albumin: 3.3 g/dL — ABNORMAL LOW (ref 3.5–5.0)
Alkaline Phosphatase: 86 U/L (ref 38–126)
Anion gap: 10 (ref 5–15)
BUN: 33 mg/dL — ABNORMAL HIGH (ref 8–23)
CO2: 28 mmol/L (ref 22–32)
Calcium: 9.3 mg/dL (ref 8.9–10.3)
Chloride: 101 mmol/L (ref 98–111)
Creatinine, Ser: 4.76 mg/dL — ABNORMAL HIGH (ref 0.44–1.00)
GFR, Estimated: 10 mL/min — ABNORMAL LOW (ref >60)
Glucose, Bld: 113 mg/dL — ABNORMAL HIGH (ref 70–99)
Potassium: 4.6 mmol/L (ref 3.5–5.1)
Sodium: 139 mmol/L (ref 135–145)
Total Bilirubin: 0.6 mg/dL (ref 0.3–1.2)
Total Protein: 6.8 g/dL (ref 6.5–8.1)

## 2020-04-16 LAB — CBC
HCT: 30.4 % — ABNORMAL LOW (ref 36.0–46.0)
Hemoglobin: 8.8 g/dL — ABNORMAL LOW (ref 12.0–15.0)
MCH: 28.7 pg (ref 26.0–34.0)
MCHC: 28.9 g/dL — ABNORMAL LOW (ref 30.0–36.0)
MCV: 99 fL (ref 80.0–100.0)
Platelets: 204 10*3/uL (ref 150–400)
RBC: 3.07 MIL/uL — ABNORMAL LOW (ref 3.87–5.11)
RDW: 19.2 % — ABNORMAL HIGH (ref 11.5–15.5)
WBC: 5.5 10*3/uL (ref 4.0–10.5)
nRBC: 0 % (ref 0.0–0.2)

## 2020-04-16 LAB — GLUCOSE, CAPILLARY
Glucose-Capillary: 120 mg/dL — ABNORMAL HIGH (ref 70–99)
Glucose-Capillary: 104 mg/dL — ABNORMAL HIGH (ref 70–99)

## 2020-04-16 MED ORDER — ISOSORBIDE MONONITRATE ER 30 MG PO TB24: 30.0000 mg | ORAL_TABLET | Freq: Every day | ORAL | 0 refills | Status: DC

## 2020-04-16 MED ORDER — ISOSORBIDE MONONITRATE ER 30 MG PO TB24
30.0000 mg | ORAL_TABLET | Freq: Once | ORAL | Status: AC
  Administered 2020-04-16: 30 mg via ORAL
  Filled 2020-04-16: qty 1

## 2020-04-16 MED ORDER — CHLORHEXIDINE GLUCONATE CLOTH 2 % EX PADS: 6.0000 | MEDICATED_PAD | Freq: Every day | CUTANEOUS | Status: DC

## 2020-04-16 NOTE — Discharge Instructions (Signed)
Angina  Angina is very bad discomfort or pain in the chest, neck, arm, jaw, or back. The discomfort is caused by a lack of blood in the middle layer of the heart wall (myocardium). What are the causes? This condition is caused by a buildup of fat and cholesterol (plaque) in your arteries (atherosclerosis). This buildup narrows the arteries and makes it hard for blood to flow. What increases the risk? You are more likely to develop this condition if:  You have high levels of cholesterol in your blood.  You have high blood pressure (hypertension).  You have diabetes.  You have a family history of heart disease.  You are not active, or you do not exercise enough.  You feel sad (depressed).  You have been treated with high energy rays (radiation) on the left side of your chest. Other risk factors are:  Using tobacco.  Being very overweight (obese).  Eating a diet high in unhealthy fats (saturated fats).  Having stress, or being exposed to things that cause stress.  Using drugs, such as cocaine. Women have a greater risk for angina if:  They are older than 55.  They have stopped having their period (are in postmenopause). What are the signs or symptoms? Common symptoms of this condition in both men and women may include:  Chest pain, which may: ? Feel like a crushing or squeezing in the chest. ? Feel like a tightness, pressure, fullness, or heaviness in the chest. ? Last for more than a few minutes at a time. ? Stop and come back (recur) after a few minutes.  Pain in the neck, arm, jaw, or back.  Heartburn or upset stomach (indigestion) for no reason.  Being short of breath.  Feeling sick to your stomach (nauseous).  Sudden cold sweats. Women and people with diabetes may have other symptoms that are not usual, such as feeling:  Tired (fatigue).  Worried or nervous (anxious) for no reason.  Weak for no reason.  Dizzy or passing out (fainting). How is this  treated? This condition may be treated with:  Medicines. These are given to: ? Prevent blood clots. ? Prevent heart attack. ? Relax blood vessels and improve blood flow to the heart (nitrates). ? Reduce blood pressure. ? Improve the pumping action of the heart. ? Reduce fat and cholesterol in the blood.  A procedure to widen a narrowed or blocked artery in the heart (angioplasty).  Surgery to allow blood to go around a blocked artery (coronary artery bypass surgery). Follow these instructions at home: Medicines  Take over-the-counter and prescription medicines only as told by your doctor.  Do not take these medicines unless your doctor says that you can: ? NSAIDs. These include:  Ibuprofen.  Naproxen. ? Vitamin supplements that have vitamin A, vitamin E, or both. ? Hormone therapy that contains estrogen with or without progestin. Eating and drinking   Eat a heart-healthy diet that includes: ? Lots of fresh fruits and vegetables. ? Whole grains. ? Low-fat (lean) protein. ? Low-fat dairy products.  Follow instructions from your doctor about what you cannot eat or drink. Activity  Follow an exercise program that your doctor tells you.  Talk with your doctor about joining a program to help improve the health of your heart (cardiac rehab).  When you feel tired, take a break. Plan breaks if you know you are going to feel tired. Lifestyle   Do not use any products that contain nicotine or tobacco. This includes cigarettes, e-cigarettes, and   chewing tobacco. If you need help quitting, ask your doctor.  If your doctor says you can drink alcohol: ? Limit how much you use to:  0-1 drink a day for women who are not pregnant.  0-2 drinks a day for men. ? Be aware of how much alcohol is in your drink. In the [place] one drink equals:  One 12 oz bottle of beer (355 mL).  One 5 oz glass of wine (148 mL).  One 1 oz glass of hard liquor (44 mL). General instructions  Stay  at a healthy weight. If your doctor tells you to do so, work with him or her to lose weight.  Learn to deal with stress. If you need help, ask your doctor.  Keep your vaccines up to date. Get a flu shot every year.  Talk with your doctor if you feel sad. Take a screening test to see if you are at risk for depression.  Work with your doctor to manage any other health problems that you have. These may include diabetes or high blood pressure.  Keep all follow-up visits as told by your doctor. This is important. Get help right away if:  You have pain in your chest, neck, arm, jaw, or back, and the pain: ? Lasts more than a few minutes. ? Comes back. ? Does not get better after you take medicine under your tongue (sublingual nitroglycerin). ? Keeps getting worse. ? Comes more often.  You have any of these problems for no reason: ? Sweating a lot. ? Heartburn or upset stomach. ? Shortness of breath. ? Trouble breathing. ? Feeling sick to your stomach. ? Throwing up (vomiting). ? Feeling more tired than normal. ? Feeling nervous or worrying more than normal. ? Weakness.  You are suddenly dizzy or light-headed.  You pass out. These symptoms may be an emergency. Do not wait to see if the symptoms will go away. Get medical help right away. Call your local emergency services (911 in the [place] Do not drive yourself to the hospital. Summary  Angina is very bad discomfort or pain in the chest, neck, arm, neck, or back.  Symptoms include chest pain, heartburn or upset stomach for no reason, and shortness of breath.  Women or people with diabetes may have symptoms that are not usual, such as feeling nervous or worried for no reason, weak for no reason, or tired.  Take all medicines only as told by your doctor.  You should eat a heart-healthy diet and follow an exercise program. This information is not intended to replace advice given to you by your health care provider. Make sure you  discuss any questions you have with your health care provider. Document Revised: 01/18/2018 Document Reviewed: 01/18/2018 Elsevier Patient Education  [employer]. Isosorbide Mononitrate extended-release tablets What is this medicine? ISOSORBIDE MONONITRATE (eye soe SOR bide mon oh NYE trate) is a vasodilator. It relaxes blood vessels, increasing the blood and oxygen supply to your heart. This medicine is used to prevent chest pain caused by angina. It will not help to stop an episode of chest pain. This medicine may be used for other purposes; ask your health care provider or pharmacist if you have questions. COMMON BRAND NAME(S): Imdur, Isotrate ER What should I tell my health care provider before I take this medicine? They need to know if you have any of these conditions:  previous heart attack or heart failure  an unusual or allergic reaction to isosorbide mononitrate, nitrates, other medicines,  foods, dyes, or preservatives  pregnant or trying to get pregnant  breast-feeding How should I use this medicine? Take this medicine by mouth with a glass of water. Follow the directions on the prescription label. Do not crush or chew. Take your medicine at regular intervals. Do not take your medicine more often than directed. Do not stop taking this medicine except on the advice of your doctor or health care professional. Talk to your pediatrician regarding the use of this medicine in children. Special care may be needed. Overdosage: If you think you have taken too much of this medicine contact a poison control center or emergency room at once. NOTE: This medicine is only for you. Do not share this medicine with others. What if I miss a dose? If you miss a dose, take it as soon as you can. If it is almost time for your next dose, take only that dose. Do not take double or extra doses. What may interact with this medicine? Do not take this medicine with any of the following  medications:  medicines used to treat erectile dysfunction (ED) like avanafil, sildenafil, tadalafil, and vardenafil  riociguat This medicine may also interact with the following medications:  medicines for high blood pressure  other medicines for angina or heart failure This list may not describe all possible interactions. Give your health care provider a list of all the medicines, herbs, non-prescription drugs, or dietary supplements you use. Also tell them if you smoke, drink alcohol, or use illegal drugs. Some items may interact with your medicine. What should I watch for while using this medicine? Check your heart rate and blood pressure regularly while you are taking this medicine. Ask your doctor or health care professional what your heart rate and blood pressure should be and when you should contact him or her. Tell your doctor or health care professional if you feel your medicine is no longer working. You may get dizzy. Do not drive, use machinery, or do anything that needs mental alertness until you know how this medicine affects you. To reduce the risk of dizzy or fainting spells, do not sit or stand up quickly, especially if you are an older patient. Alcohol can make you more dizzy, and increase flushing and rapid heartbeats. Avoid alcoholic drinks. Do not treat yourself for coughs, colds, or pain while you are taking this medicine without asking your doctor or health care professional for advice. Some ingredients may increase your blood pressure. What side effects may I notice from receiving this medicine? Side effects that you should report to your doctor or health care professional as soon as possible:  bluish discoloration of lips, fingernails, or palms of hands  irregular heartbeat, palpitations  low blood pressure  nausea, vomiting  persistent headache  unusually weak or tired Side effects that usually do not require medical attention (report to your doctor or health  care professional if they continue or are bothersome):  flushing of the face or neck  rash This list may not describe all possible side effects. Call your doctor for medical advice about side effects. You may report side effects to FDA at 1-800-FDA-1088. Where should I keep my medicine? Keep out of the reach of children. Store between 15 and 30 degrees C (59 and 86 degrees F). Keep container tightly closed. Throw away any unused medicine after the expiration date. NOTE: This sheet is a summary. It may not cover all possible information. If you have questions about this medicine, talk to  your doctor, pharmacist, or health care provider.  2020 Elsevier/Gold Standard (2013-04-01 14:48:19)

## 2020-04-16 NOTE — Progress Notes (Signed)
Pt has been being up in room with RW on one leg. Sts no CP. Declines walking with her prosthesis as she has lost weight and it makes her stump sore. Discussed smoking cessation, following RD's advise on diet from HD, NTG. Pt voiced understanding. Will sign off. [id number] Yves Dill CES, ACSM 3:11 PM 04/16/2020

## 2020-04-16 NOTE — Progress Notes (Addendum)
Progress Note  Patient Name: Brandy Houston Date of Encounter: 04/16/2020  [place] Cardiologist: No primary care provider on file.   Subjective   No chest pain or shortness of breath last 24 hours.  Inpatient Medications    Scheduled Meds: . aspirin EC  81 mg Oral Daily  . atorvastatin  10 mg Oral QHS  . calcium acetate  2,001 mg Oral BID WC   And  . calcium acetate  1,334 mg Oral Q lunch  . carvedilol  3.125 mg Oral BID WC  . cloNIDine  0.1 mg Oral Q12H  . gabapentin  200 mg Oral 3 times per day on Sun Mon Wed Fri  . gabapentin  300 mg Oral 2 times per day on Tue Thu Sat  . hydrALAZINE  50 mg Oral Q8H  . insulin aspart  0-5 Units Subcutaneous QHS  . insulin aspart  0-6 Units Subcutaneous TID WC  . multivitamin  1 tablet Oral Daily  . nicotine  14 mg Transdermal Daily  . pantoprazole  20 mg Oral BID  . sertraline  100 mg Oral QPM  . sodium chloride flush  3 mL Intravenous Q12H  . sodium chloride flush  3 mL Intravenous Q12H   Continuous Infusions: . sodium chloride    . ferric gluconate (FERRLECIT/NULECIT) IV Stopped (04/14/20 1725)   PRN Meds: sodium chloride, acetaminophen, calcium acetate, diphenhydrAMINE, methocarbamol, morphine, polyethylene glycol, sodium chloride flush, traZODone   Vital Signs    Vitals:   04/15/20 1454 04/15/20 1959 04/16/20 0517 04/16/20 0812  BP: (!) 151/67 (!) 142/68 (!) 159/73 (!) 155/69  Pulse:  77 76 83  Resp:  13 14 16   Temp: 99.1 F (37.3 C) 98.5 F (36.9 C) 97.8 F (36.6 C) 98.5 F (36.9 C)  TempSrc: Oral Oral Oral Oral  SpO2:  92%  95%  Weight:      Height:        Intake/Output Summary (Last 24 hours) at 04/16/2020 0924 Last data filed at 04/16/2020 0813 Gross per 24 hour  Intake 563 ml  Output --  Net 563 ml   Last 3 Weights 04/15/2020 04/14/2020 04/14/2020  Weight (lbs) 123 lb 14.4 oz 123 lb 10.9 oz 131 lb 9.8 oz  Weight (kg) 56.2 kg 56.1 kg 59.7 kg      Telemetry    Sinus rhythm without significant  arrhythmia - Personally Reviewed  ECG    NSR with low voltage, St-T abnormality consider lateral ischemia - Personally Reviewed  Physical Exam  Alert, oriented, in NAD GEN: No acute distress.   Neck: No JVD Cardiac: RRR, 2/6 systolic murmur along the LLSB Respiratory: Clear to auscultation bilaterally. GI: Soft, nontender, non-distended  MS:  L AKA Neuro:  Nonfocal  Psych: Normal affect   Labs    High Sensitivity Troponin:  No results for input(s): TROPONINIHS in the last 720 hours.    Chemistry Recent Labs  Lab 04/13/20 1001 04/13/20 1003 04/14/20 0447  NA 136 136 137  K 3.4* 3.5 3.7  CL  --   --  97*  CO2  --   --  31  GLUCOSE  --   --  141*  BUN  --   --  39*  CREATININE  --   --  4.63*  CALCIUM  --   --  8.6*  GFRNONAA  --   --  10*  ANIONGAP  --   --  9     Hematology Recent Labs  Lab  04/13/20 1001 04/13/20 1003 04/14/20 0447  WBC  --   --  4.0  RBC  --   --  2.66*  HGB 9.2* 9.9* 7.7*  HCT 27.0* 29.0* 26.0*  MCV  --   --  97.7  MCH  --   --  28.9  MCHC  --   --  29.6*  RDW  --   --  19.4*  PLT  --   --  163    BNPNo results for input(s): BNP, PROBNP in the last 168 hours.   DDimer No results for input(s): DDIMER in the last 168 hours.   Radiology    No results found.  Cardiac Studies   Cardiac Cath: Conclusion    Prox RCA lesion is 50% stenosed.  Prox Cx to Mid Cx lesion is 99% stenosed.  Ost LAD to Prox LAD lesion is 70% stenosed.  Dist Cx lesion is 60% stenosed.  Ramus lesion is 90% stenosed.   1. Severe proximal LAD stenosis (DFR 0.88) 2. Severe ostial ramus intermediate stenosis 3. Severe proximal Circumflex stenosis. The proximal stenotic segment is heavily calcified and in a bend in the vessel with more calcific disease in the mid vessel.  4. The RCA is a large dominant artery with moderate proximal stenosis.  5. Elevated right and left heart pressures.   Recommendations: She has three vessel CAD. The large Circumflex  is heavily calcified with the most severe lesion in a sharp bend in the vessel. PCI of this vessel would require orbital atherectomy and I do not feel that this would be safe given the bend in the vessel. The proximal LAD lesion is significant by pressure wire analysis. The large intermediate branch has severe disease. Will call CT surgery for CABG consult. She also has aortic valve disease which will need to be addressed.  Admit to telemetry. Continue ASA, beta blocker and statin.   Echo: IMPRESSIONS    1. Left ventricular ejection fraction, by estimation, is 30 to 35%. The  left ventricle has moderately decreased function. The left ventricle  demonstrates global hypokinesis. The left ventricular internal cavity size  was mildly dilated. There is moderate  left ventricular hypertrophy. Left ventricular diastolic parameters are  consistent with Grade II diastolic dysfunction (pseudonormalization).  2. Right ventricular systolic function is normal. The right ventricular  size is moderately enlarged. There is severely elevated pulmonary artery  systolic pressure. The estimated right ventricular systolic pressure is  06.2 mmHg.  3. Left atrial size was moderately dilated.  4. Right atrial size was moderately dilated.  5. Moderate pericardial effusion.  6. The mitral valve is grossly normal. Mild to moderate mitral valve  regurgitation. No evidence of mitral stenosis.  7. Tricuspid valve regurgitation is severe.  8. The aortic valve is calcified. Aortic valve regurgitation is moderate.  Mild to moderate aortic valve stenosis.  9. The inferior vena cava is dilated in size with <50% respiratory  variability, suggesting right atrial pressure of 15 mmHg.   Patient Profile     64 y.o. female with history of ESRD on HD, DM, HTN, anemia, septic arthritis s/p Left AKA, aortic valve insufficiency, tricuspid valve insufficiency, new diagnosis of ischemic cardiomyopathy and CAD who is being  admitted  following diagnostic cardiac cath. She was referred for cath today by Dr. Gardiner Rhyme. Chest pain dating back to June 2020 at which time she had anemia and low risk stress test. Now having chest pain with HD and with moderate exertion. LVEF=30=35% with moderate  pericardial effusion, mild to moderate MR, moderate AI, severe TR.  Assessment & Plan    1.  Unstable angina with multivessel CAD: I have personally reviewed the patient's cardiac catheterization films.  She has severe complex coronary disease with the most critical lesion in left circumflex and an area of heavy calcification and coronary angulation.  I think this would be the very high risk for atherectomy and PCI.  [name] other coronary disease is moderate to severe and can be managed medically.  Unfortunately she has severe comorbid conditions including end-stage renal disease, moderate pericardial effusion, severe tricuspid regurgitation, significant LV dysfunction, and gave syndrome with chronic anemia.  I think between the high procedural risk, high bleeding risk with dual antiplatelet therapy, and comorbid conditions, it is best to treat her conservatively with medical therapy.  Discussed this with the patient today.  Will discuss with the renal team.  Might be best to transfuse her to get hemoglobin up prior to hospital discharge. 2.  Valvular heart disease with moderate aortic insufficiency and severe tricuspid valve insufficiency.  Patient turned down for cardiac surgery.  Palliative medical therapy planned. 3.  End-stage renal disease: Normally dialyzes on Tuesdays, Thursdays, and Saturdays.  [place] nephrology team care. 4.  Cardiomyopathy, likely ischemic: Treated with carvedilol and hydralazine at present.  Volume removal with dialysis.  Disposition: We will discuss with renal team regarding timing of discharge, PRBC transfusion, and follow-up care. Case discussed with the patient's primary cardiologist, Dr  Gardiner Rhyme.  For questions or updates, please contact [facility] Please consult [email or web address] for contact info under     Signed, Sherren Mocha, MD  04/16/2020, 9:24 AM    ADDENDUM: FU HgB 8.8, no indication for transfusion. Pt remains stable. Discussed medical therapy with her. Will try to add isosorbide 30 mg daily and use NTG as needed for chest pain.   Sherren Mocha 04/16/2020 3:54 PM

## 2020-04-16 NOTE — Telephone Encounter (Signed)
Will need follow up after hospitalization.  Need to continue to speak to her hospitalists

## 2020-04-16 NOTE — Telephone Encounter (Signed)
Patient is calling in asking if there is any way she can get an order placed for her to have surgery for her GI bled in her stomach, states she is currently in the hospital for a heart condition and trying different medicines to help. States that her stomach hurts her so badly she is hardly able to eat anything and has tried bringing it up to the doctors at the hospital but they cant do anything.

## 2020-04-16 NOTE — Discharge Summary (Signed)
Discharge Summary    [name] MRN: [id number]; DOB: Mar 10, 1956  Admit date: 04/13/2020 Discharge date: 04/16/2020  Primary Care Provider: [name], MD Primary Cardiologist: Donato Heinz, MD  Primary Electrophysiologist:  None   Discharge Diagnoses    Principal Problem:   Unstable angina [facility]) Active Problems:   End stage renal disease ([place])   Anemia in chronic kidney disease   Tobacco use   Acute combined systolic and diastolic heart failure [facility])    Diagnostic Studies/Procedures    Cath: 04/13/20   Prox RCA lesion is 50% stenosed.  Prox Cx to Mid Cx lesion is 99% stenosed.  Ost LAD to Prox LAD lesion is 70% stenosed.  Dist Cx lesion is 60% stenosed.  Ramus lesion is 90% stenosed.   1. Severe proximal LAD stenosis (DFR 0.88) 2. Severe ostial ramus intermediate stenosis 3. Severe proximal Circumflex stenosis. The proximal stenotic segment is heavily calcified and in a bend in the vessel with more calcific disease in the mid vessel.  4. The RCA is a large dominant artery with moderate proximal stenosis.  5. Elevated right and left heart pressures.   Recommendations: She has three vessel CAD. The large Circumflex is heavily calcified with the most severe lesion in a sharp bend in the vessel. PCI of this vessel would require orbital atherectomy and I do not feel that this would be safe given the bend in the vessel. The proximal LAD lesion is significant by pressure wire analysis. The large intermediate branch has severe disease. Will call CT surgery for CABG consult. She also has aortic valve disease which will need to be addressed.  Admit to telemetry. Continue ASA, beta blocker and statin.   Diagnostic Dominance: Left   _____________   History of Present Illness     Brandy Houston is a 64 y.o. female with history of ESRD on HD, DM, HTN, anemia, septic arthritis s/p Left AKA, aortic valve insufficiency, tricuspid valve  insufficiency, new diagnosis of  ischemic cardiomyopathy and CAD who was being admitted following diagnostic cardiac cath. She was referred for cath today by Dr. Gardiner Rhyme. Chest pain dating back to June 2020 at which time she had anemia and low risk stress test. Now having chest pain with HD and with moderate exertion. LVEF=30=35% with moderate pericardial effusion, mild to moderate MR, moderate AI, severe TR.   Cardiac cath today with severe disease in the proximal LAD, ostial intermediate branch, proximal Circumflex. Given complex disease, she was admitted for TCTS management.   Hospital Course     Consultants: TCTS, nephrology  1.  Unstable angina with multivessel CAD: Her cath films were reviewed by Dr. Burt Knack and Angelena Form.  She has severe complex coronary disease with the most critical lesion in left circumflex and an area of heavy calcification and coronary angulation.  It was felt this would be a very high risk for atherectomy and PCI.  [name] other coronary disease was moderate to severe and can be managed medically.  Unfortunately she has severe comorbid conditions including end-stage renal disease, moderate pericardial effusion, severe tricuspid regurgitation, significant LV dysfunction, and gave syndrome with chronic anemia.  It was felt that between the high procedural risk, high bleeding risk with dual antiplatelet therapy, and comorbid conditions, it would be best to treat her conservatively with medical therapy. This was discussed with the patient, nephrology team and pt's PCP cardiologist with everyone in agreement to continue medical therapy.   2.  Valvular heart disease  with moderate aortic insufficiency and severe tricuspid valve insufficiency:  Patient turned down for cardiac surgery.  Palliative medical therapy planned.  3.  End-stage renal disease: Normally dialyzes on Tuesdays, Thursdays, and Saturdays.  [place] nephrology team care.  4.  Cardiomyopathy, felt to likely  be ischemic: Treated with carvedilol and hydralazine at present.  Volume removal with dialysis. -- adde Imdur 53m daily prior to discharge  5. Anemia of chronic disease: Hgb dropped to 7.7. On ESA, and given IV iron. Follow up Hgb on day of discharge was 8.8.   6. Tobacco use: cessation advised  Did the patient have an acute coronary syndrome (MI, NSTEMI, STEMI, etc) this admission?:  No                               Did the patient have a percutaneous coronary intervention (stent / angioplasty)?:  No.   _____________  Discharge Vitals Blood pressure (!) 155/69, pulse 83, temperature 98.5 F (36.9 C), temperature source Oral, resp. rate 16, height '5\' 7"'  (1.702 m), weight 56.2 kg, last menstrual period 03/16/2002, SpO2 95 %.  Filed Weights   04/14/20 1315 04/14/20 1729 04/15/20 0508  Weight: 59.7 kg 56.1 kg 56.2 kg    Labs & Radiologic Studies    CBC Recent Labs    04/14/20 0447 04/16/20 1132  WBC 4.0 5.5  HGB 7.7* 8.8*  HCT 26.0* 30.4*  MCV 97.7 99.0  PLT 163 2802  Basic Metabolic Panel Recent Labs    04/14/20 0447 04/16/20 1132  NA 137 139  K 3.7 4.6  CL 97* 101  CO2 31 28  GLUCOSE 141* 113*  BUN 39* 33*  CREATININE 4.63* 4.76*  CALCIUM 8.6* 9.3   Liver Function Tests Recent Labs    04/16/20 1132  AST 14*  ALT 22  ALKPHOS 86  BILITOT 0.6  PROT 6.8  ALBUMIN 3.3*   No results for input(s): LIPASE, AMYLASE in the last 72 hours. High Sensitivity Troponin:   No results for input(s): TROPONINIHS in the last 720 hours.  BNP Invalid input(s): POCBNP D-Dimer No results for input(s): DDIMER in the last 72 hours. Hemoglobin A1C Recent Labs    04/14/20 0447  HGBA1C 4.6*   Fasting Lipid Panel Recent Labs    04/16/20 1132  CHOL 122  HDL 83  LDLCALC 23  TRIG 82  CHOLHDL 1.5   Thyroid Function Tests No results for input(s): TSH, T4TOTAL, T3FREE, THYROIDAB in the last 72 hours.  Invalid input(s): FREET3 _____________  CARDIAC  CATHETERIZATION  Result Date: 04/13/2020  Prox RCA lesion is 50% stenosed.  Prox Cx to Mid Cx lesion is 99% stenosed.  Ost LAD to Prox LAD lesion is 70% stenosed.  Dist Cx lesion is 60% stenosed.  Ramus lesion is 90% stenosed.  1. Severe proximal LAD stenosis (DFR 0.88) 2. Severe ostial ramus intermediate stenosis 3. Severe proximal Circumflex stenosis. The proximal stenotic segment is heavily calcified and in a bend in the vessel with more calcific disease in the mid vessel. 4. The RCA is a large dominant artery with moderate proximal stenosis. 5. Elevated right and left heart pressures. Recommendations: She has three vessel CAD. The large Circumflex is heavily calcified with the most severe lesion in a sharp bend in the vessel. PCI of this vessel would require orbital atherectomy and I do not feel that this would be safe given the bend in the vessel. The proximal LAD lesion  is significant by pressure wire analysis. The large intermediate branch has severe disease. Will call CT surgery for CABG consult. She also has aortic valve disease which will need to be addressed. Admit to telemetry. Continue ASA, beta blocker and statin.   ECHOCARDIOGRAM COMPLETE  Result Date: 03/28/2020    ECHOCARDIOGRAM REPORT   Patient Name:   CELESTIA DUVA Date of Exam: 03/28/2020 Medical Rec #:  [id number]     Height:       67.0 in Accession #:    [id number]    Weight:       144.0 lb Date of Birth:  Jul 22, 1955     BSA:          1.759 m Patient Age:    29 years      BP:           120/64 mmHg Patient Gender: F             HR:           91 bpm. Exam Location:  [facility] Procedure: 2D Echo, Cardiac Doppler and Color Doppler Indications:    I35.1  History:        Patient has prior history of Echocardiogram examinations, most                 recent 04/13/2019. AI, Arrythmias:ESRD; Risk                 Factors:Hypertension and Diabetes.  Sonographer:    Coralyn Helling RDCS Referring Phys: [id number] [place]  1. Left ventricular ejection fraction, by estimation, is 30 to 35%. The left ventricle has moderately decreased function. The left ventricle demonstrates global hypokinesis. The left ventricular internal cavity size was mildly dilated. There is moderate  left ventricular hypertrophy. Left ventricular diastolic parameters are consistent with Grade II diastolic dysfunction (pseudonormalization).  2. Right ventricular systolic function is normal. The right ventricular size is moderately enlarged. There is severely elevated pulmonary artery systolic pressure. The estimated right ventricular systolic pressure is 61.4 mmHg.  3. Left atrial size was moderately dilated.  4. Right atrial size was moderately dilated.  5. Moderate pericardial effusion.  6. The mitral valve is grossly normal. Mild to moderate mitral valve regurgitation. No evidence of mitral stenosis.  7. Tricuspid valve regurgitation is severe.  8. The aortic valve is calcified. Aortic valve regurgitation is moderate. Mild to moderate aortic valve stenosis.  9. The inferior vena cava is dilated in size with <50% respiratory variability, suggesting right atrial pressure of 15 mmHg. FINDINGS  Left Ventricle: Left ventricular ejection fraction, by estimation, is 30 to 35%. The left ventricle has moderately decreased function. The left ventricle demonstrates global hypokinesis. The left ventricular internal cavity size was mildly dilated. There is moderate left ventricular hypertrophy. Left ventricular diastolic parameters are consistent with Grade II diastolic dysfunction (pseudonormalization). Right Ventricle: The right ventricular size is moderately enlarged. No increase in right ventricular wall thickness. Right ventricular systolic function is normal. There is severely elevated pulmonary artery systolic pressure. The tricuspid regurgitant velocity is 3.85 m/s, and with an assumed right atrial pressure of 15 mmHg, the estimated right ventricular  systolic pressure is 43.1 mmHg. Left Atrium: Left atrial size was moderately dilated. Right Atrium: Right atrial size was moderately dilated. Pericardium: A moderately sized pericardial effusion is present. Mitral Valve: The mitral valve is grossly normal. Mild to moderate mitral valve regurgitation. No evidence of mitral valve stenosis. Tricuspid Valve: The tricuspid valve is  grossly normal. Tricuspid valve regurgitation is severe. Aortic Valve: The aortic valve is calcified. Aortic valve regurgitation is moderate. Aortic regurgitation PHT measures 300 msec. Mild to moderate aortic stenosis is present. Aortic valve mean gradient measures 18.7 mmHg. Aortic valve peak gradient measures 34.4 mmHg. Aortic valve area, by VTI measures 0.98 cm. Pulmonic Valve: The pulmonic valve was normal in structure. Pulmonic valve regurgitation is not visualized. Aorta: The aortic root and ascending aorta are structurally normal, with no evidence of dilitation. Venous: The inferior vena cava is dilated in size with less than 50% respiratory variability, suggesting right atrial pressure of 15 mmHg. IAS/Shunts: The atrial septum is grossly normal.  LEFT VENTRICLE PLAX 2D LVIDd:         4.70 cm  Diastology LVIDs:         3.60 cm  LV e' medial:    5.11 cm/s LV PW:         1.50 cm  LV E/e' medial:  25.2 LV IVS:        1.40 cm  LV e' lateral:   9.03 cm/s LVOT diam:     2.00 cm  LV E/e' lateral: 14.3 LV SV:         55 LV SV Index:   31 LVOT Area:     3.14 cm  RIGHT VENTRICLE RV S prime:     14.70 cm/s TAPSE (M-mode): 1.8 cm RVSP:           67.3 mmHg LEFT ATRIUM             Index       RIGHT ATRIUM           Index LA diam:        4.40 cm 2.50 cm/m  RA Pressure: 8.00 mmHg LA Vol (A2C):   89.2 ml 50.72 ml/m RA Area:     26.70 cm LA Vol (A4C):   60.6 ml 34.46 ml/m RA Volume:   94.50 ml  53.73 ml/m LA Biplane Vol: 75.3 ml 42.81 ml/m  AORTIC VALVE AV Area (Vmax):    0.96 cm AV Area (Vmean):   0.99 cm AV Area (VTI):     0.98 cm AV Vmax:            293.33 cm/s  PULMONARY ARTERY AV Vmean:          200.000 cm/s MPA diam:        3.00 cm AV VTI:            0.564 m AV Peak Grad:      34.4 mmHg AV Mean Grad:      18.7 mmHg LVOT Vmax:         89.70 cm/s LVOT Vmean:        63.000 cm/s LVOT VTI:          0.176 m LVOT/AV VTI ratio: 0.31 AI PHT:            300 msec  AORTA Ao Root diam: 3.20 cm Ao Asc diam:  3.50 cm MV E velocity: 129.00 cm/s  TRICUSPID VALVE MV A velocity: 108.00 cm/s  TR Peak grad:   59.3 mmHg MV E/A ratio:  1.19         TR Vmax:        385.00 cm/s                             Estimated RAP:  8.00 mmHg  RVSP:           67.3 mmHg                              SHUNTS                             Systemic VTI:  0.18 m                             Systemic Diam: 2.00 cm Mertie Moores MD Electronically signed by Mertie Moores MD Signature Date/Time: 03/28/2020/1:54:57 PM    Final    Disposition   Pt is being discharged home today in good condition.  Follow-up Plans & Appointments     Follow-up Information    Donato Heinz, MD Follow up on 04/25/2020.   Specialties: Cardiology, Radiology Why: at 3:20pm for your follow up appt.  Contact information: [address] [place] [address] [phone]              Discharge Instructions    Diet - low sodium heart healthy   Complete by: As directed    Discharge instructions   Complete by: As directed    Groin Site Care Refer to this sheet in the next few weeks. These instructions provide you with information on caring for yourself after your procedure. Your caregiver may also give you more specific instructions. Your treatment has been planned according to current medical practices, but problems sometimes occur. Call your caregiver if you have any problems or questions after your procedure. HOME CARE INSTRUCTIONS You may shower 24 hours after the procedure. Remove the bandage (dressing) and gently wash the site with plain soap  and water. Gently pat the site dry.  Do not apply powder or lotion to the site.  Do not sit in a bathtub, swimming pool, or whirlpool for 5 to 7 days.  No bending, squatting, or lifting anything over 10 pounds (4.5 kg) as directed by your caregiver.  Inspect the site at least twice daily.  Do not drive home if you are discharged the same day of the procedure. Have someone else drive you.  You may drive 24 hours after the procedure unless otherwise instructed by your caregiver.  What to expect: Any bruising will usually fade within 1 to 2 weeks.  Blood that collects in the tissue (hematoma) may be painful to the touch. It should usually decrease in size and tenderness within 1 to 2 weeks.  SEEK IMMEDIATE MEDICAL CARE IF: You have unusual pain at the groin site or down the affected leg.  You have redness, warmth, swelling, or pain at the groin site.  You have drainage (other than a small amount of blood on the dressing).  You have chills.  You have a fever or persistent symptoms for more than 72 hours.  You have a fever and your symptoms suddenly get worse.  Your leg becomes pale, cool, tingly, or numb.  You have heavy bleeding from the site. Hold pressure on the site. .   Increase activity slowly   Complete by: As directed       Discharge Medications   Allergies as of 04/16/2020      Reactions   Compazine [prochlorperazine] Shortness Of Breath, Swelling, Other (See Comments)   TONGUE SWELLS   Shellfish-derived Products Anaphylaxis   Iodinated  Diagnostic Agents Hives, Rash   Omnipaque [iohexol] Hives   Tape    Brown paper tape caused skin irritation   Sulfa Antibiotics Rash      Medication List    STOP taking these medications   nystatin 100000 UNIT/ML suspension Commonly known as: MYCOSTATIN   predniSONE 50 MG tablet Commonly known as: DELTASONE     TAKE these medications   Accu-Chek Guide test strip Generic drug: glucose blood Check blood sugar up to 4 times a day    Accu-Chek Guide w/Device Kit 1 Device by Does not apply route daily.   accu-chek softclix lancets Check sugars TID for E11.65   Accu-Chek Softclix Lancets lancets Check sugars three times a day for E11.65   acetaminophen 500 MG tablet Commonly known as: TYLENOL Take 500-1,000 mg by mouth every 6 (six) hours as needed (for pain.).   aspirin EC 81 MG tablet Take 81 mg by mouth daily.   atorvastatin 10 MG tablet Commonly known as: LIPITOR TAKE 1 TABLET BY MOUTH AT BEDTIME . APPOINTMENT REQUIRED FOR FUTURE REFILLS What changed: See the new instructions.   blood glucose meter kit and supplies Kit Dispense based on patient and insurance preference. Use up to four times daily as directed   calcium acetate 667 MG capsule Commonly known as: PHOSLO Take 1,334-2,001 mg by mouth See admin instructions. Take 3 capsules (2001 mg) by mouth with breakfast, take 2 capsules (1334 mg) by mouth with lunch, take 3 capsules (2001 mg) by mouth with supper & take 2 capsules (1334 mg) by mouth with snacks.   carvedilol 3.125 MG tablet Commonly known as: COREG Take 1 tablet (3.125 mg total) by mouth 2 (two) times daily.   cloNIDine 0.1 MG tablet Commonly known as: CATAPRES Take 0.1 mg by mouth every 12 (twelve) hours.   diphenhydrAMINE 25 mg capsule Commonly known as: BENADRYL Take 25 mg by mouth every 6 (six) hours as needed for allergies.   gabapentin 300 MG capsule Commonly known as: NEURONTIN Take 300 mg by mouth See admin instructions. Take 1 capsule (300 mg) by mouth twice daily on Tuesdays, Thursdays, & Saturdays.   gabapentin 100 MG capsule Commonly known as: NEURONTIN Take 200 mg by mouth See admin instructions. Take 2 capsules (200 mg) by mouth 3 times on Sundays, Mondays, Wednesdays, & Fridays.   hydrALAZINE 100 MG tablet Commonly known as: APRESOLINE Take 100 mg by mouth in the morning and at bedtime.   isosorbide mononitrate 30 MG 24 hr tablet Commonly known as: IMDUR Take  1 tablet (30 mg total) by mouth daily.   lidocaine-prilocaine cream Commonly known as: EMLA Apply 1 application topically Every Tuesday,Thursday,and Saturday with dialysis. On days of dialysis 3x week   methocarbamol 500 MG tablet Commonly known as: ROBAXIN Take 500 mg by mouth every 8 (eight) hours as needed for muscle spasms.   MIRCERA IJ Get it at dialysis   multivitamin Tabs tablet Take 1 tablet by mouth daily.   pantoprazole 20 MG tablet Commonly known as: PROTONIX Take 20 mg by mouth 2 (two) times daily.   polyethylene glycol 17 g packet Commonly known as: MIRALAX / GLYCOLAX Take 17 g by mouth daily as needed for moderate constipation.   sertraline 100 MG tablet Commonly known as: ZOLOFT Take 1 tablet by mouth once daily What changed: when to take this   traZODone 50 MG tablet Commonly known as: DESYREL Take 50 mg by mouth at bedtime as needed for sleep.  Outstanding Labs/Studies   N/a   Duration of Discharge Encounter   Greater than 30 minutes including physician time.  Signed, Reino Bellis, NP 04/16/2020, 4:53 PM

## 2020-04-16 NOTE — Progress Notes (Signed)
[facility] Progress Note   Subjective:   Seen and examined at bedside. Anxious about her health after talking to cardiology. Reports general weakness for the past 2 months but no new changes. No CP or SOB at present.   Objective Vitals:   04/15/20 1454 04/15/20 1959 04/16/20 0517 04/16/20 0812  BP: (!) 151/67 (!) 142/68 (!) 159/73 (!) 155/69  Pulse:  77 76 83  Resp:  13 14 16   Temp: 99.1 F (37.3 C) 98.5 F (36.9 C) 97.8 F (36.6 C) 98.5 F (36.9 C)  TempSrc: Oral Oral Oral Oral  SpO2:  92%  95%  Weight:      Height:       Physical Exam General: Well developed, alert female in NAD Heart: RRR, 2/6 systolic murmur Lungs: CTA bilaterally without wheezing, rhonchi or rales Abdomen: Soft, non-tender, non-distended, +BS Extremities: No edema b/l lower extremities, L AKA Dialysis Access:  RUE AVF + bruit  Additional Objective Labs: Basic Metabolic Panel: Recent Labs  Lab 04/13/20 1001 04/13/20 1003 04/14/20 0447  NA 136 136 137  K 3.4* 3.5 3.7  CL  --   --  97*  CO2  --   --  31  GLUCOSE  --   --  141*  BUN  --   --  39*  CREATININE  --   --  4.63*  CALCIUM  --   --  8.6*   CBC: Recent Labs  Lab 04/13/20 1001 04/13/20 1003 04/14/20 0447  WBC  --   --  4.0  HGB 9.2* 9.9* 7.7*  HCT 27.0* 29.0* 26.0*  MCV  --   --  97.7  PLT  --   --  163   Blood Culture    Component Value Date/Time   SDES URINE, RANDOM 02/08/2017 1138   SPECREQUEST CX ADDED AT 0327 ON [id number] 02/08/2017 1138   CULT >=100,000 COLONIES/mL YEAST (A) 02/08/2017 1138   REPTSTATUS 02/10/2017 FINAL 02/08/2017 1138    CBG: Recent Labs  Lab 04/15/20 1202 04/15/20 1626 04/15/20 2200 04/16/20 0744 04/16/20 1147  GLUCAP 134* 185* 188* 120* 104*   Medications: . sodium chloride    . ferric gluconate (FERRLECIT/NULECIT) IV Stopped (04/14/20 1725)   . aspirin EC  81 mg Oral Daily  . atorvastatin  10 mg Oral QHS  . calcium acetate  2,001 mg Oral BID WC   And  . calcium  acetate  1,334 mg Oral Q lunch  . carvedilol  3.125 mg Oral BID WC  . cloNIDine  0.1 mg Oral Q12H  . gabapentin  200 mg Oral 3 times per day on Sun Mon Wed Fri  . gabapentin  300 mg Oral 2 times per day on Tue Thu Sat  . hydrALAZINE  50 mg Oral Q8H  . insulin aspart  0-5 Units Subcutaneous QHS  . insulin aspart  0-6 Units Subcutaneous TID WC  . multivitamin  1 tablet Oral Daily  . nicotine  14 mg Transdermal Daily  . pantoprazole  20 mg Oral BID  . sertraline  100 mg Oral QPM  . sodium chloride flush  3 mL Intravenous Q12H  . sodium chloride flush  3 mL Intravenous Q12H    Dialysis Orders: [facility] TTS 4h 450/800 EDW 59.5kg 2K/2Ca UFP 4 R AVF No heparin Venofer 100 IV x 10 (until 11/13) Mircera 225 q 2 weeks (last 10/28) Calcitriol 1.25 TIW   Assessment/Plan: 1. Severe multivessel CAD   -- 3V disease s/p  R/L heart  cath 10/29.  Not amenable for PCI -per cardiology CTS consult for possible CABG -- Per. Dr. Nils Pyle -very high risk for CABG. Cardiology planning for medical management 2. ICM/ EF 30-35%- euvolemic on exam 3. ESRD -Tolerating dialysis well. HD TTS. Continue on schedule.  Next HD 11/2.  4. Hypertension/volume - Continue home meds. Net UF 3.8L on 10/30. Below EDW by weights here, will need new EDW at discharge.  5. Anemia - Hgb 7.7 On max ESA, not due for next dose yet. Continue course of iron. Hx of GAVE as below. AM Hgb is pending. If <8, recommend 1 unit PRBC given significant CAD 6. Metabolic bone disease -Ca ok. Continue binders/calcitriol.  7. Hx GI bleed -EGD with GAVE in 01/2020. Scheduled for endoscopy with APC on 05/14/20   Anice Paganini, PA-C 04/16/2020, 11:49 AM  [facility] Pager: [phone]

## 2020-04-17 DIAGNOSIS — N186 End stage renal disease: Secondary | ICD-10-CM | POA: Diagnosis not present

## 2020-04-17 DIAGNOSIS — Z23 Encounter for immunization: Secondary | ICD-10-CM | POA: Diagnosis not present

## 2020-04-17 DIAGNOSIS — N2581 Secondary hyperparathyroidism of renal origin: Secondary | ICD-10-CM | POA: Diagnosis not present

## 2020-04-17 DIAGNOSIS — D631 Anemia in chronic kidney disease: Secondary | ICD-10-CM | POA: Diagnosis not present

## 2020-04-17 DIAGNOSIS — Z992 Dependence on renal dialysis: Secondary | ICD-10-CM | POA: Diagnosis not present

## 2020-04-17 DIAGNOSIS — D509 Iron deficiency anemia, unspecified: Secondary | ICD-10-CM | POA: Diagnosis not present

## 2020-04-17 DIAGNOSIS — E1129 Type 2 diabetes mellitus with other diabetic kidney complication: Secondary | ICD-10-CM | POA: Diagnosis not present

## 2020-04-19 ENCOUNTER — Telehealth: Payer: Self-pay

## 2020-04-19 DIAGNOSIS — Z992 Dependence on renal dialysis: Secondary | ICD-10-CM | POA: Diagnosis not present

## 2020-04-19 DIAGNOSIS — N186 End stage renal disease: Secondary | ICD-10-CM | POA: Diagnosis not present

## 2020-04-19 DIAGNOSIS — D509 Iron deficiency anemia, unspecified: Secondary | ICD-10-CM | POA: Diagnosis not present

## 2020-04-19 DIAGNOSIS — D631 Anemia in chronic kidney disease: Secondary | ICD-10-CM | POA: Diagnosis not present

## 2020-04-19 DIAGNOSIS — I5041 Acute combined systolic (congestive) and diastolic (congestive) heart failure: Secondary | ICD-10-CM | POA: Diagnosis not present

## 2020-04-19 DIAGNOSIS — E1129 Type 2 diabetes mellitus with other diabetic kidney complication: Secondary | ICD-10-CM | POA: Diagnosis not present

## 2020-04-19 DIAGNOSIS — I2510 Atherosclerotic heart disease of native coronary artery without angina pectoris: Secondary | ICD-10-CM | POA: Diagnosis not present

## 2020-04-19 DIAGNOSIS — N2581 Secondary hyperparathyroidism of renal origin: Secondary | ICD-10-CM | POA: Diagnosis not present

## 2020-04-19 NOTE — Telephone Encounter (Signed)
Pt scheduled for 11/17

## 2020-04-19 NOTE — Telephone Encounter (Signed)
This pt is in need of a hospital follow up. Pt is asking for a Wed or a Fri. Can we work this pt in next week or the week after? Please advise.

## 2020-04-19 NOTE — Telephone Encounter (Cosign Needed)
Transition Care Management Follow-up Telephone Call  Date of discharge and from where: 04/16/20 from [facility]  How have you been since you were released from the hospital? Been a little tired   Any questions or concerns? No  Items Reviewed:  Did the pt receive and understand the discharge instructions provided? Yes   Medications obtained and verified? No   Other? No   Any new allergies since your discharge? No   Dietary orders reviewed? Yes  Do you have support at home? Yes   Home Care and Equipment/Supplies: Were home health services ordered? not applicable If so, what is the name of the agency? n/a  Has the agency set up a time to come to the patient's home? not applicable Were any new equipment or medical supplies ordered?  No What is the name of the medical supply agency? n/a Were you able to get the supplies/equipment? not applicable Do you have any questions related to the use of the equipment or supplies? No  Functional Questionnaire: (I = Independent and D = Dependent) ADLs: I  Bathing/Dressing- I  Meal Prep- I  Eating- I  Maintaining continence- I  Transferring/Ambulation- I  Managing Meds- I  Follow up appointments reviewed:   PCP Hospital f/u appt confirmed? Yes  appt to be schedlued and called back to confirm  Specialist Hospital f/u appt confirmed? Yes  Scheduled to see Cardiology on 04/25/20 @ 3:20pm.  Are transportation arrangements needed? No   If their condition worsens, is the pt aware to call PCP or go to the Emergency Dept.? Yes  Was the patient provided with contact information for the PCP's office or ED? Yes  Was to pt encouraged to call back with questions or concerns? Yes

## 2020-04-19 NOTE — Telephone Encounter (Signed)
There is an appt open on 11/10 next week at 11:30??

## 2020-04-21 DIAGNOSIS — N186 End stage renal disease: Secondary | ICD-10-CM | POA: Diagnosis not present

## 2020-04-21 DIAGNOSIS — D509 Iron deficiency anemia, unspecified: Secondary | ICD-10-CM | POA: Diagnosis not present

## 2020-04-21 DIAGNOSIS — N2581 Secondary hyperparathyroidism of renal origin: Secondary | ICD-10-CM | POA: Diagnosis not present

## 2020-04-21 DIAGNOSIS — Z992 Dependence on renal dialysis: Secondary | ICD-10-CM | POA: Diagnosis not present

## 2020-04-21 DIAGNOSIS — D631 Anemia in chronic kidney disease: Secondary | ICD-10-CM | POA: Diagnosis not present

## 2020-04-21 DIAGNOSIS — E1129 Type 2 diabetes mellitus with other diabetic kidney complication: Secondary | ICD-10-CM | POA: Diagnosis not present

## 2020-04-23 ENCOUNTER — Telehealth: Payer: Self-pay

## 2020-04-23 ENCOUNTER — Telehealth: Payer: Self-pay | Admitting: Cardiology

## 2020-04-23 NOTE — Telephone Encounter (Signed)
Yes we can refer

## 2020-04-23 NOTE — Telephone Encounter (Signed)
Routed to MD to review and advise on referral

## 2020-04-23 NOTE — Telephone Encounter (Signed)
Home care delivery is checking on physicians orders that were sent back incomplete and they are checking status of paperwork  [phone] anyone there can help if we have any questions

## 2020-04-23 NOTE — Telephone Encounter (Signed)
° ° °  Pt's daughter is requesting to send a referral to [facility] for cardiologist. She said they want pt to get a 2nd opinion there

## 2020-04-23 NOTE — Telephone Encounter (Signed)
Spoke with customer service, stated that faxes can take 24-48 hours to process. Since I faxed it in on Friday 11/5, it is still being processed.

## 2020-04-24 DIAGNOSIS — D631 Anemia in chronic kidney disease: Secondary | ICD-10-CM | POA: Diagnosis not present

## 2020-04-24 DIAGNOSIS — Z992 Dependence on renal dialysis: Secondary | ICD-10-CM | POA: Diagnosis not present

## 2020-04-24 DIAGNOSIS — N2581 Secondary hyperparathyroidism of renal origin: Secondary | ICD-10-CM | POA: Diagnosis not present

## 2020-04-24 DIAGNOSIS — E1129 Type 2 diabetes mellitus with other diabetic kidney complication: Secondary | ICD-10-CM | POA: Diagnosis not present

## 2020-04-24 DIAGNOSIS — D509 Iron deficiency anemia, unspecified: Secondary | ICD-10-CM | POA: Diagnosis not present

## 2020-04-24 DIAGNOSIS — N186 End stage renal disease: Secondary | ICD-10-CM | POA: Diagnosis not present

## 2020-04-24 NOTE — Telephone Encounter (Signed)
OV tomorrow with Dr. Gardiner Rhyme Will place referral at [place] once discussed

## 2020-04-25 ENCOUNTER — Ambulatory Visit (INDEPENDENT_AMBULATORY_CARE_PROVIDER_SITE_OTHER): Payer: Medicare Other | Admitting: Cardiology

## 2020-04-25 ENCOUNTER — Other Ambulatory Visit: Payer: Self-pay

## 2020-04-25 ENCOUNTER — Encounter: Payer: Self-pay | Admitting: Cardiology

## 2020-04-25 VITALS — BP 140/62 | HR 82 | Ht 60.0 in | Wt 135.0 lb

## 2020-04-25 DIAGNOSIS — I10 Essential (primary) hypertension: Secondary | ICD-10-CM | POA: Diagnosis not present

## 2020-04-25 DIAGNOSIS — I351 Nonrheumatic aortic (valve) insufficiency: Secondary | ICD-10-CM | POA: Diagnosis not present

## 2020-04-25 DIAGNOSIS — I313 Pericardial effusion (noninflammatory): Secondary | ICD-10-CM

## 2020-04-25 DIAGNOSIS — R002 Palpitations: Secondary | ICD-10-CM | POA: Diagnosis not present

## 2020-04-25 DIAGNOSIS — I071 Rheumatic tricuspid insufficiency: Secondary | ICD-10-CM

## 2020-04-25 DIAGNOSIS — I3139 Other pericardial effusion (noninflammatory): Secondary | ICD-10-CM

## 2020-04-25 DIAGNOSIS — I5041 Acute combined systolic (congestive) and diastolic (congestive) heart failure: Secondary | ICD-10-CM

## 2020-04-25 DIAGNOSIS — I2510 Atherosclerotic heart disease of native coronary artery without angina pectoris: Secondary | ICD-10-CM | POA: Diagnosis not present

## 2020-04-25 MED ORDER — NITROGLYCERIN 0.4 MG SL SUBL: 0.4000 mg | SUBLINGUAL_TABLET | SUBLINGUAL | 3 refills | Status: AC | PRN

## 2020-04-25 NOTE — Patient Instructions (Signed)
Medication Instructions:  Take sublingual nitroglycerin AS NEEDED   *If you need a refill on your cardiac medications before your next appointment, please call your pharmacy*  Follow-Up: At [facility], you and your health needs are our priority.  As part of our continuing mission to provide you with exceptional heart care, we have created designated Provider Care Teams.  These Care Teams include your primary Cardiologist (physician) and Advanced Practice Providers (APPs -  Physician Assistants and Nurse Practitioners) who all work together to provide you with the care you need, when you need it.  We recommend signing up for the patient portal called "MyChart".  Sign up information is provided on this After Visit Summary.  MyChart is used to connect with patients for Virtual Visits (Telemedicine).  Patients are able to view lab/test results, encounter notes, upcoming appointments, etc.  Non-urgent messages can be sent to your provider as well.   To learn more about what you can do with MyChart, go to [email or web address].    Your next appointment:   1 month(s)  The format for your next appointment:   In Person  Provider:   Oswaldo Milian, MD

## 2020-04-25 NOTE — Progress Notes (Signed)
Cardiology Office Note:    Date:  04/26/2020   ID:  Brandy [place], DOB 1956/01/30, MRN [id number]  PCP:  [name], MD  Cardiologist:  Donato Heinz, MD  Electrophysiologist:  None   Referring MD: [name], MD   Chief Complaint  Patient presents with  . Congestive Heart Failure   History of Present Illness:    Brandy INCORVAIA is a 64 y.o. female with a hx of multivessel CAD, HFrEF, ESRD on HD, type 2 diabetes, moderate aortic regurgitation, hypertension, anemia, septic arthritis s/p L AKA who presents for follow-up.  She was referred by Dr. Jimmy Footman for an evaluation of chest pain on 04/04/2019.  She presented to the ED on 11/27/2018 with chest pain.  Work-up was unremarkable but was notable for low hemoglobin (7.1).  Given her symptoms, Carlton Adam Myoview was done on 04/15/2019, which showed small fixed defect at apex, otherwise no abnormalities.  TTE on 04/13/2019 showed mild aortic stenosis, moderate aortic regurgitation, normal EF, grade 2 diastolic dysfunction, normal RV function.  Zio patch x7 days on 02/01/2020 showed one 4 beat run of NSVT, otherwise unremarkable.  Repeat echo on 03/28/2020 was done to follow-up her aortic regurgitation, which showed new LV systolic dysfunction (EF 30 to 35%, global hypokinesis), moderate LVH, grade 2 diastolic dysfunction, normal RV function, moderate RV enlargement, severe elevation in RVSP (74 mmHg), moderate pericardial effusion, moderate biatrial dilatation, mild to moderate mitral regurgitation, severe tricuspid regurgitation, moderate aortic regurgitation.  Cardiac catheterization on 04/13/2020 showed 99% proximal to mid LCx stenosis, 50% proximal RCA, 70% ostial to proximal LAD, 60% distal LCx, 90% ramus.  RHC showed elevated pressures (RA 15, RV 54/6, PA 59/24, PW 25, LVEDP 22).  She was evaluated by cardiac surgery and felt not to be a candidate for CABG.  She was also evaluated by interventional cardiology and recommended medical  management.  Since last clinic visit, she reports that she has been having dyspnea but no chest pain. Denies any lightheadedness or syncope. Continues to note intermittent blood in her stool, planning EGD with Dr. Silverio Decamp later this month    Past Medical History:  Diagnosis Date  . Allergy   . Anemia   . Anxiety   . Arthritis    "in my joints" (03/10/2017)  . Chronic diastolic CHF (congestive heart failure) ([place])   . CKD (chronic kidney disease), stage IV ([place])    stage IV. previous HD, none currently 10/30/15 (confirmed 02/05/2017 & 03/10/2017)  . Depression    Chronic  . Diabetic peripheral neuropathy ([place])   . Dysrhythmia    tachycardia, normal ECHO 08-09-14  . GERD (gastroesophageal reflux disease)   . Heart murmur     dx'd 02/05/2017  . Hepatitis C    "tx'd in 2016; I'm negative now" (02/05/2017)  . History of blood transfusion 2017   "w/knee replacement"  . Hyperlipidemia   . Hypertension   . Osteoarthritis of left knee   . Peripheral neuropathy   . Protein calorie malnutrition ([place])   . Septic arthritis ([place])   . Slow transit constipation   . Symptomatic anemia 01/27/2020  . Type II diabetes mellitus ([name])    IDDM  . Uncontrolled hypertension 02/18/2013  . Unsteady gait     Past Surgical History:  Procedure Laterality Date  . A/V FISTULAGRAM Right 07/07/2018   Procedure: A/V FISTULAGRAM;  Surgeon: Marty Heck, MD;  Location: [place] [facility];  Service: Cardiovascular;  Laterality: Right;  . AMPUTATION Left  03/20/2017   Procedure: LEFT ABOVE KNEE AMPUTATION;  Surgeon: Newt Minion, MD;  Location: [place];  Service: Orthopedics;  Laterality: Left;  . APPENDECTOMY    . AV FISTULA PLACEMENT Left 09/13/2014   Procedure: Brachial Artery to Brachial Vein Gortex Four - Seven Stretch GRAFT INSERTION Left Forearm;  Surgeon: Mal Misty, MD;  Location: [place];  Service: Vascular;  Laterality: Left;  . BASCILIC VEIN TRANSPOSITION Right 10/26/2017   Procedure: BASILIC  VEIN TRANSPOSITION FIRST STAGE RIGHT ARM;  Surgeon: Conrad New Britain, MD;  Location: [place];  Service: Vascular;  Laterality: Right;  . BASCILIC VEIN TRANSPOSITION Right 02/01/2018   Procedure: SECOND STAGE BASILIC VEIN TRANSPOSITION RIGHT UPPER EXTREMITY;  Surgeon: Marty Heck, MD;  Location: [place];  Service: Vascular;  Laterality: Right;  . CHOLECYSTECTOMY OPEN    . COLON SURGERY    . COLONOSCOPY    . EXCISIONAL TOTAL KNEE ARTHROPLASTY WITH ANTIBIOTIC SPACERS Left 02/06/2017   Procedure: Incisional total iknee with antibiotic spacer ;  Surgeon: Leandrew Koyanagi, MD;  Location: [place];  Service: Orthopedics;  Laterality: Left;  . INTRAVASCULAR PRESSURE WIRE/FFR STUDY N/A 04/13/2020   Procedure: INTRAVASCULAR PRESSURE WIRE/FFR STUDY;  Surgeon: Burnell Blanks, MD;  Location: [place] [facility];  Service: Cardiovascular;  Laterality: N/A;  . IR FLUORO GUIDE CV LINE RIGHT  02/10/2017  . IR REMOVAL TUN CV CATH W/O FL  04/01/2017  . IR US GUIDE VASC ACCESS RIGHT  02/10/2017  . IRRIGATION AND DEBRIDEMENT KNEE Left 03/12/2017   Procedure: IRRIGATION AND DEBRIDEMENT LEFT KNEE WITH WOUND VAC APPLICATION;  Surgeon: Leandrew Koyanagi, MD;  Location: [place];  Service: Orthopedics;  Laterality: Left;  . JOINT REPLACEMENT    . KNEE ARTHROSCOPY Right 08/10/2014   Procedure: ARTHROSCOPY I & D KNEE;  Surgeon: Marianna Payment, MD;  Location: [facility];  Service: Orthopedics;  Laterality: Right;  . KNEE ARTHROSCOPY Left 08/11/2014   Procedure: ARTHROSCOPIC WASHOUT LEFT KNEE;  Surgeon: Marianna Payment, MD;  Location: [place];  Service: Orthopedics;  Laterality: Left;  . KNEE ARTHROSCOPY Left 08/19/2014   Procedure: ARTHROSCOPIC WASHOUT LEFT KNEE;  Surgeon: Leandrew Koyanagi, MD;  Location: [place];  Service: Orthopedics;  Laterality: Left;  . KNEE ARTHROSCOPY WITH LATERAL MENISECTOMY Left 04/04/2015   Procedure: AND PARTIAL LATERAL MENISECTOMY;  Surgeon: Leandrew Koyanagi, MD;  Location: [place];  Service:  Orthopedics;  Laterality: Left;  . KNEE ARTHROSCOPY WITH MEDIAL MENISECTOMY Left 04/04/2015   Procedure: LEFT KNEE ARTHROSCOPY WITH PARTIAL MEDIAL MENISCECTOMY  AND SYNOVECTOMY;  Surgeon: Leandrew Koyanagi, MD;  Location: [place];  Service: Orthopedics;  Laterality: Left;  . PERIPHERAL VASCULAR BALLOON ANGIOPLASTY  07/07/2018   Procedure: PERIPHERAL VASCULAR BALLOON ANGIOPLASTY;  Surgeon: Marty Heck, MD;  Location: [place] [facility];  Service: Cardiovascular;;  right AV fistula  . POLYPECTOMY    . RIGHT/LEFT HEART CATH AND CORONARY ANGIOGRAPHY N/A 04/13/2020   Procedure: RIGHT/LEFT HEART CATH AND CORONARY ANGIOGRAPHY;  Surgeon: Burnell Blanks, MD;  Location: [place] [facility];  Service: Cardiovascular;  Laterality: N/A;  . SHOULDER ARTHROSCOPY Bilateral 08/10/2014   Procedure: I & D BILATERAL SHOULDERS ;  Surgeon: Marianna Payment, MD;  [facility];  Service: Orthopedics;  Laterality: Bilateral;  . SMALL INTESTINE SURGERY     Due to Small Bowel Obstruction; "fixed it when they did my gallbladder OR"  . TEE WITHOUT CARDIOVERSION N/A 08/14/2014   Procedure: TRANSESOPHAGEAL  ECHOCARDIOGRAM (TEE);  Surgeon: Thayer Headings, MD;  Location: [place];  Service: Cardiovascular;  Laterality: N/A;  . TENOSYNOVECTOMY Right 08/11/2014   Procedure: RIGHT WRIST IRRIGATION AND DEBRIDEMENT, TENOSYNOVECTOMY;  Surgeon: Marianna Payment, MD;  Location: [place];  Service: Orthopedics;  Laterality: Right;  . TOTAL KNEE ARTHROPLASTY Left 11/07/2015   Procedure: LEFT TOTAL KNEE ARTHROPLASTY WITH REVISION OF IMPLANTS;  Surgeon: Leandrew Koyanagi, MD;  Location: [place];  Service: Orthopedics;  Laterality: Left;  . TUBAL LIGATION      Current Medications: Current Meds  Medication Sig  . Accu-Chek Softclix Lancets lancets Check sugars three times a day for E11.65  . acetaminophen (TYLENOL) 500 MG tablet Take 500-1,000 mg by mouth every 6 (six) hours as needed (for pain.).  [name] aspirin  EC 81 MG tablet Take 81 mg by mouth daily.  [name] atorvastatin (LIPITOR) 10 MG tablet TAKE 1 TABLET BY MOUTH AT BEDTIME . APPOINTMENT REQUIRED FOR FUTURE REFILLS (Patient taking differently: Take 10 mg by mouth at bedtime. )  . blood glucose meter kit and supplies KIT Dispense based on patient and insurance preference. Use up to four times daily as directed  . Blood Glucose Monitoring Suppl (ACCU-CHEK GUIDE) w/Device KIT 1 Device by Does not apply route daily.  . calcium acetate (PHOSLO) 667 MG capsule Take 1,334-2,001 mg by mouth See admin instructions. Take 3 capsules (2001 mg) by mouth with breakfast, take 2 capsules (1334 mg) by mouth with lunch, take 3 capsules (2001 mg) by mouth with supper & take 2 capsules (1334 mg) by mouth with snacks.  . carvedilol (COREG) 3.125 MG tablet Take 1 tablet (3.125 mg total) by mouth 2 (two) times daily.  . cloNIDine (CATAPRES) 0.1 MG tablet Take 0.1 mg by mouth every 12 (twelve) hours.  . diphenhydrAMINE (BENADRYL) 25 mg capsule Take 25 mg by mouth every 6 (six) hours as needed for allergies.  [name] gabapentin (NEURONTIN) 100 MG capsule Take 200 mg by mouth See admin instructions. Take 2 capsules (200 mg) by mouth 3 times on Sundays, Mondays, Wednesdays, & Fridays.  [name] gabapentin (NEURONTIN) 300 MG capsule Take 300 mg by mouth See admin instructions. Take 1 capsule (300 mg) by mouth twice daily on Tuesdays, Thursdays, & Saturdays.  [name] glucose blood (ACCU-CHEK GUIDE) test strip Check blood sugar up to 4 times a day  . hydrALAZINE (APRESOLINE) 100 MG tablet Take 100 mg by mouth in the morning and at bedtime.   . isosorbide mononitrate (IMDUR) 30 MG 24 hr tablet Take 1 tablet (30 mg total) by mouth daily.  [name] [facility]) lancets Check sugars TID for E11.65  . lidocaine-prilocaine (EMLA) cream Apply 1 application topically Every Tuesday,Thursday,and Saturday with dialysis. On days of dialysis 3x week  . methocarbamol (ROBAXIN) 500 MG tablet Take 500 mg  by mouth every 8 (eight) hours as needed for muscle spasms.   . Methoxy PEG-Epoetin Beta (MIRCERA IJ) Get it at dialysis  . multivitamin (RENA-VIT) TABS tablet Take 1 tablet by mouth daily.  . pantoprazole (PROTONIX) 20 MG tablet Take 20 mg by mouth 2 (two) times daily.  . polyethylene glycol (MIRALAX / GLYCOLAX) 17 g packet Take 17 g by mouth daily as needed for moderate constipation.  . sertraline (ZOLOFT) 100 MG tablet Take 1 tablet by mouth once daily (Patient taking differently: Take 100 mg by mouth every evening. )  . traZODone (DESYREL) 50 MG tablet Take 50 mg by mouth at bedtime as needed for sleep.  Allergies:   Compazine [prochlorperazine], Shellfish-derived products, Iodinated diagnostic agents, Omnipaque [iohexol], Tape, and Sulfa antibiotics   Social History   Socioeconomic History  . Marital status: Single    Spouse name: Not on file  . Number of children: 3  . Years of education: Not on file  . Highest education level: Not on file  Occupational History  . Occupation: disabled  Tobacco Use  . Smoking status: Current Every Day Smoker    Packs/day: 0.50    Years: 35.00    Pack years: 17.50    Types: Cigarettes  . Smokeless tobacco: Never Used  Vaping Use  . Vaping Use: Never used  Substance and Sexual Activity  . Alcohol use: Not Currently    Alcohol/week: 0.0 standard drinks    Comment: 03/10/2017 "I drink a wine cooler a few times/year"  . Drug use: No  . Sexual activity: Not Currently  Other Topics Concern  . Not on file  Social History Narrative  . Not on file   Social Determinants of Health   Financial Resource Strain:   . Difficulty of Paying Living Expenses: Not on file  Food Insecurity:   . Worried About Charity fundraiser in the Last Year: Not on file  . Ran Out of Food in the Last Year: Not on file  Transportation Needs:   . Lack of Transportation (Medical): Not on file  . Lack of Transportation (Non-Medical): Not on file  Physical  Activity:   . Days of Exercise per Week: Not on file  . Minutes of Exercise per Session: Not on file  Stress:   . Feeling of Stress : Not on file  Social Connections:   . Frequency of Communication with Friends and Family: Not on file  . Frequency of Social Gatherings with Friends and Family: Not on file  . Attends Religious Services: Not on file  . Active Member of Clubs or Organizations: Not on file  . Attends Archivist Meetings: Not on file  . Marital Status: Not on file     Family History: [name] family history includes Cancer in her brother; Diabetes in her brother; Heart disease in her father; Hypertension in her brother and sister; Uterine cancer in her mother and sister. There is no history of Colon cancer, Colon polyps, Esophageal cancer, Rectal cancer, or Stomach cancer.  ROS:   Please see the history of present illness.    All other systems reviewed and are negative.  EKGs/Labs/Other Studies Reviewed:    The following studies were reviewed today:   EKG:  EKG is ordered today.  The ekg ordered today demonstrates normal sinus rhythm, rate 82, Q waves in V1-2, low voltage, nonspecific T wave flattening  Recent Labs: 06/05/2019: Magnesium 2.7 04/16/2020: ALT 22; BUN 33; Creatinine, Ser 4.76; Hemoglobin 8.8; Platelets 204; Potassium 4.6; Sodium 139  Recent Lipid Panel    Component Value Date/Time   CHOL 122 04/16/2020 1132   TRIG 82 04/16/2020 1132   HDL 83 04/16/2020 1132   CHOLHDL 1.5 04/16/2020 1132   VLDL 16 04/16/2020 1132   LDLCALC 23 04/16/2020 Gregory 03/3019:  Nuclear stress EF: 51%. No wall motion abnormalities. Low normal ejection fraction.  There was no ST segment deviation noted during stress.  Defect 1: There is a small fixed defect of mild severity at the apex. No evidence of ischemia identified.  This is a low risk study. No ischemia identified.  TTE 04/13/19:   1.  The AoV is tricuspid and moderately  calcified. Mild aortic stenosis is  present with Vmax 2.9 mmHg and mean gradient 18 mmHg. Moderate aortic  regurgitation is present with PHT 352 msec. The aortic root is normal in  size. This appears to be  degenerative calcific AoV disease.  2. Left ventricular ejection fraction, by visual estimation, is 60 to  65%. The left ventricle has normal function. There is moderately increased  left ventricular hypertrophy.  3. Elevated left atrial pressure.  4. Left ventricular diastolic parameters are consistent with Grade II  diastolic dysfunction (pseudonormalization).  5. The left ventricle has no regional wall motion abnormalities.  6. Global right ventricle has normal systolic function.The right  ventricular size is normal. No increase in right ventricular wall  thickness.  7. Trivial pericardial effusion is present.  8. Mild calcification of the anterior mitral valve leaflet(s).  9. Mild mitral annular calcification.  10. Mild thickening of the anterior and posterior mitral valve leaflet(s).  11. The mitral valve is degenerative. Trace mitral valve regurgitation.  12. The tricuspid valve is grossly normal. Tricuspid valve regurgitation  is trivial.  13. Aortic valve regurgitation is moderate.  14. The aortic valve is tricuspid. Aortic valve regurgitation is moderate.  Mild aortic valve stenosis.  15. There is Moderate calcification of the aortic valve.  16. There is Moderate thickening of the aortic valve.  17. The pulmonic valve was grossly normal. Pulmonic valve regurgitation is  trivial.  18. Mild plaque invoving the ascending aorta.  19. TR signal is inadequate for assessing pulmonary artery systolic  pressure.  20. The inferior vena cava is normal in size with greater than 50%  respiratory variability, suggesting right atrial pressure of 3 mmHg.   Physical Exam:    VS:  BP 140/62   Pulse 82   Ht 5' (1.524 m)   Wt 135 lb (61.2 kg)   LMP 03/16/2002   SpO2 100%    BMI 26.37 kg/m     Wt Readings from Last 3 Encounters:  04/25/20 135 lb (61.2 kg)  04/15/20 123 lb 14.4 oz (56.2 kg)  04/04/20 140 lb 9.6 oz (63.8 kg)     GEN: in no acute distress HEENT: Normal NECK: No JVD; No carotid bruits LYMPHATICS: No lymphadenopathy CARDIAC: RRR, 3/6 systolic murmur RESPIRATORY:  Clear to auscultation without rales, wheezing or rhonchi  ABDOMEN: Soft, non-tender, non-distended MUSCULOSKELETAL:  No edema. S/p L AKA SKIN: Warm and dry NEUROLOGIC:  Alert and oriented x 3 PSYCHIATRIC:  Normal affect   ASSESSMENT:    1. CAD in native artery   2. Acute combined systolic and diastolic heart failure ([place])   3. Pericardial effusion   4. Palpitations   5. Aortic valve insufficiency, etiology of cardiac valve disease unspecified   6. Tricuspid valve insufficiency, unspecified etiology   7. Essential hypertension    PLAN:    CAD:  Cardiac catheterization on 04/13/2020 showed 99% proximal to mid LCx stenosis, 50% proximal RCA, 70% ostial to proximal LAD, 60% distal LCx, 90% ramus.  RHC showed elevated pressures (RA 15, RV 54/6, PA 59/24, PW 25, LVEDP 22).  She was evaluated by cardiac surgery and felt not to be a candidate for CABG.  She was also evaluated by interventional cardiology and recommended medical management. -Patient requesting referral to interventional cardiology at [facility] for second opinion. Discussed that she is not currently a candidate for PCI given ongoing GI bleeding issues from [place]. EGD planned later this month for treatment. If  bleeding resolves, can refer to [facility]. In the meantime, we will continue medical management. She currently reports no chest pain. -Continue aspirin 81 mg daily, atorvastatin, Coreg 3.125 mg twice daily, and Imdur 30 mg daily.  PRN SL NTG  Acute combined systolic and diastolic heart failure:  Lexiscan Myoview was done for chest pain on 04/15/2019, which showed small fixed defect at apex, otherwise no  abnormalities.  TTE on 04/13/2019 showed mild aortic stenosis, moderate aortic regurgitation, normal EF, grade 2 diastolic dysfunction, normal RV function.Repeat echo on 03/28/2020 was done to follow-up her aortic regurgitation, which showed new LV systolic dysfunction (EF 30 to 35%, global hypokinesis), moderate LVH, grade 2 diastolic dysfunction, normal RV function, moderate RV enlargement, severe elevation in RVSP (74 mmHg), moderate pericardial effusion, moderate biatrial dilatation, mild to moderate mitral regurgitation, severe tricuspid regurgitation, moderate aortic regurgitation.  Cardiac catheterization on 04/13/2020 showed 99% proximal to mid LCx stenosis, 50% proximal RCA, 70% ostial to proximal LAD, 60% distal LCx, 90% ramus.  RHC showed elevated pressures (RA 15, RV 54/6, PA 59/24, PW 25, LVEDP 22).   -Continue carvedilol 3.125 mg BID -Continue hydralazine 100 mg 3 times daily and Imdur 30 mg daily -Volume removal with HD  Perciardial effusion: moderate effusion on echo 03/28/2020, will repeat limited echocardiogram to follow effusion  Palpitations: Zio patch x7 days on 02/01/2020 showed one 4 beat run of NSVT, otherwise unremarkable.  Aortic regurgitation: Moderate on TTE in 03/2019.  Stable on repeat echo on 03/2020  Tricuspid regurgitation: Severe on echo 03/2020. Evaluated by cardiac surgery, not a candidate for intervention as above. Volume removal through HD  Hypertension: On amlodipine 10 mg daily, hydralazine 100 mg 3 times daily on non-HD days, clonidine 0.2 mg twice daily, Coreg 3.125 mg twice daily, Imdur 30 mg daily  Type 2 diabetes: On insulin.  Last A1c 6.3 on 02/28/2019  HLD: On atorvastatin 10 mg daily.  Last LDL 23 on 04/16/2020  ESRD: On HD  RTC in 1 month  Medication Adjustments/Labs and Tests Ordered: Current medicines are reviewed at length with the patient today.  Concerns regarding medicines are outlined above.  Orders Placed This Encounter  Procedures  .  EKG 12-Lead   Meds ordered this encounter  Medications  . nitroGLYCERIN (NITROSTAT) 0.4 MG SL tablet    Sig: Place 1 tablet (0.4 mg total) under the tongue every 5 (five) minutes as needed.    Dispense:  25 tablet    Refill:  3    Patient Instructions  Medication Instructions:  Take sublingual nitroglycerin AS NEEDED   *If you need a refill on your cardiac medications before your next appointment, please call your pharmacy*  Follow-Up: At [facility], you and your health needs are our priority.  As part of our continuing mission to provide you with exceptional heart care, we have created designated Provider Care Teams.  These Care Teams include your primary Cardiologist (physician) and Advanced Practice Providers (APPs -  Physician Assistants and Nurse Practitioners) who all work together to provide you with the care you need, when you need it.  We recommend signing up for the patient portal called "MyChart".  Sign up information is provided on this After Visit Summary.  MyChart is used to connect with patients for Virtual Visits (Telemedicine).  Patients are able to view lab/test results, encounter notes, upcoming appointments, etc.  Non-urgent messages can be sent to your provider as well.   To learn more about what you can do with  MyChart, go to [email or web address].    Your next appointment:   1 month(s)  The format for your next appointment:   In Person  Provider:   Oswaldo Milian, MD       Signed, Donato Heinz, MD  04/26/2020 2:56 PM    Brandy Houston

## 2020-04-26 DIAGNOSIS — N2581 Secondary hyperparathyroidism of renal origin: Secondary | ICD-10-CM | POA: Diagnosis not present

## 2020-04-26 DIAGNOSIS — E1129 Type 2 diabetes mellitus with other diabetic kidney complication: Secondary | ICD-10-CM | POA: Diagnosis not present

## 2020-04-26 DIAGNOSIS — D509 Iron deficiency anemia, unspecified: Secondary | ICD-10-CM | POA: Diagnosis not present

## 2020-04-26 DIAGNOSIS — Z992 Dependence on renal dialysis: Secondary | ICD-10-CM | POA: Diagnosis not present

## 2020-04-26 DIAGNOSIS — N186 End stage renal disease: Secondary | ICD-10-CM | POA: Diagnosis not present

## 2020-04-26 DIAGNOSIS — D631 Anemia in chronic kidney disease: Secondary | ICD-10-CM | POA: Diagnosis not present

## 2020-04-27 ENCOUNTER — Telehealth: Payer: Self-pay | Admitting: *Deleted

## 2020-04-27 DIAGNOSIS — I3139 Other pericardial effusion (noninflammatory): Secondary | ICD-10-CM

## 2020-04-27 DIAGNOSIS — I313 Pericardial effusion (noninflammatory): Secondary | ICD-10-CM

## 2020-04-27 NOTE — Telephone Encounter (Addendum)
Per Dr. Gardiner Rhyme, limited echo ordered f/u pericardial effusion.   Patient aware and verbalized understanding.

## 2020-04-28 DIAGNOSIS — D509 Iron deficiency anemia, unspecified: Secondary | ICD-10-CM | POA: Diagnosis not present

## 2020-04-28 DIAGNOSIS — E1129 Type 2 diabetes mellitus with other diabetic kidney complication: Secondary | ICD-10-CM | POA: Diagnosis not present

## 2020-04-28 DIAGNOSIS — N186 End stage renal disease: Secondary | ICD-10-CM | POA: Diagnosis not present

## 2020-04-28 DIAGNOSIS — D631 Anemia in chronic kidney disease: Secondary | ICD-10-CM | POA: Diagnosis not present

## 2020-04-28 DIAGNOSIS — N2581 Secondary hyperparathyroidism of renal origin: Secondary | ICD-10-CM | POA: Diagnosis not present

## 2020-04-28 DIAGNOSIS — Z992 Dependence on renal dialysis: Secondary | ICD-10-CM | POA: Diagnosis not present

## 2020-05-01 ENCOUNTER — Telehealth: Payer: Self-pay

## 2020-05-01 DIAGNOSIS — D509 Iron deficiency anemia, unspecified: Secondary | ICD-10-CM | POA: Diagnosis not present

## 2020-05-01 DIAGNOSIS — D631 Anemia in chronic kidney disease: Secondary | ICD-10-CM | POA: Diagnosis not present

## 2020-05-01 DIAGNOSIS — N2581 Secondary hyperparathyroidism of renal origin: Secondary | ICD-10-CM | POA: Diagnosis not present

## 2020-05-01 DIAGNOSIS — N186 End stage renal disease: Secondary | ICD-10-CM | POA: Diagnosis not present

## 2020-05-01 DIAGNOSIS — E1129 Type 2 diabetes mellitus with other diabetic kidney complication: Secondary | ICD-10-CM | POA: Diagnosis not present

## 2020-05-01 DIAGNOSIS — Z992 Dependence on renal dialysis: Secondary | ICD-10-CM | POA: Diagnosis not present

## 2020-05-01 NOTE — Telephone Encounter (Signed)
OK to work in

## 2020-05-01 NOTE — Telephone Encounter (Signed)
Pt called stating she was not able to make her Hosp f/u appt today. Pt asked if she could reschedule. There are no openings until January. Ok to work in?

## 2020-05-02 ENCOUNTER — Emergency Department (HOSPITAL_COMMUNITY)
Admission: EM | Admit: 2020-05-02 | Discharge: 2020-05-02 | Disposition: A | Discharge status: Home / Self Care | Payer: Medicare Other | Attending: Emergency Medicine | Admitting: Emergency Medicine

## 2020-05-02 ENCOUNTER — Inpatient Hospital Stay: Payer: Medicare Other | Admitting: Family Medicine

## 2020-05-02 DIAGNOSIS — I130 Hypertensive heart and chronic kidney disease with heart failure and stage 1 through stage 4 chronic kidney disease, or unspecified chronic kidney disease: Secondary | ICD-10-CM | POA: Diagnosis not present

## 2020-05-02 DIAGNOSIS — D649 Anemia, unspecified: Secondary | ICD-10-CM

## 2020-05-02 DIAGNOSIS — F17210 Nicotine dependence, cigarettes, uncomplicated: Secondary | ICD-10-CM | POA: Diagnosis not present

## 2020-05-02 DIAGNOSIS — E1142 Type 2 diabetes mellitus with diabetic polyneuropathy: Secondary | ICD-10-CM | POA: Diagnosis not present

## 2020-05-02 DIAGNOSIS — J9601 Acute respiratory failure with hypoxia: Secondary | ICD-10-CM | POA: Diagnosis not present

## 2020-05-02 DIAGNOSIS — Z95828 Presence of other vascular implants and grafts: Secondary | ICD-10-CM | POA: Diagnosis not present

## 2020-05-02 DIAGNOSIS — Z79899 Other long term (current) drug therapy: Secondary | ICD-10-CM | POA: Insufficient documentation

## 2020-05-02 DIAGNOSIS — Z7982 Long term (current) use of aspirin: Secondary | ICD-10-CM | POA: Insufficient documentation

## 2020-05-02 DIAGNOSIS — I5032 Chronic diastolic (congestive) heart failure: Secondary | ICD-10-CM | POA: Diagnosis not present

## 2020-05-02 DIAGNOSIS — E1122 Type 2 diabetes mellitus with diabetic chronic kidney disease: Secondary | ICD-10-CM | POA: Insufficient documentation

## 2020-05-02 DIAGNOSIS — N184 Chronic kidney disease, stage 4 (severe): Secondary | ICD-10-CM | POA: Insufficient documentation

## 2020-05-02 DIAGNOSIS — I10 Essential (primary) hypertension: Secondary | ICD-10-CM | POA: Diagnosis not present

## 2020-05-02 DIAGNOSIS — K922 Gastrointestinal hemorrhage, unspecified: Secondary | ICD-10-CM | POA: Diagnosis not present

## 2020-05-02 DIAGNOSIS — R5383 Other fatigue: Secondary | ICD-10-CM | POA: Diagnosis present

## 2020-05-02 LAB — COMPREHENSIVE METABOLIC PANEL
ALT: 22 U/L (ref 0–44)
AST: 16 U/L (ref 15–41)
Albumin: 2.9 g/dL — ABNORMAL LOW (ref 3.5–5.0)
Alkaline Phosphatase: 69 U/L (ref 38–126)
Anion gap: 14 (ref 5–15)
BUN: 26 mg/dL — ABNORMAL HIGH (ref 8–23)
CO2: 26 mmol/L (ref 22–32)
Calcium: 8.2 mg/dL — ABNORMAL LOW (ref 8.9–10.3)
Chloride: 102 mmol/L (ref 98–111)
Creatinine, Ser: 4.09 mg/dL — ABNORMAL HIGH (ref 0.44–1.00)
GFR, Estimated: 12 mL/min — ABNORMAL LOW (ref >60)
Glucose, Bld: 127 mg/dL — ABNORMAL HIGH (ref 70–99)
Potassium: 3.4 mmol/L — ABNORMAL LOW (ref 3.5–5.1)
Sodium: 142 mmol/L (ref 135–145)
Total Bilirubin: 0.1 mg/dL — ABNORMAL LOW (ref 0.3–1.2)
Total Protein: 6 g/dL — ABNORMAL LOW (ref 6.5–8.1)

## 2020-05-02 LAB — CBC
HCT: 23 % — ABNORMAL LOW (ref 36.0–46.0)
Hemoglobin: 6.6 g/dL — CL (ref 12.0–15.0)
MCH: 29.7 pg (ref 26.0–34.0)
MCHC: 28.7 g/dL — ABNORMAL LOW (ref 30.0–36.0)
MCV: 103.6 fL — ABNORMAL HIGH (ref 80.0–100.0)
Platelets: 209 10*3/uL (ref 150–400)
RBC: 2.22 MIL/uL — ABNORMAL LOW (ref 3.87–5.11)
RDW: 20.5 % — ABNORMAL HIGH (ref 11.5–15.5)
WBC: 3.9 10*3/uL — ABNORMAL LOW (ref 4.0–10.5)
nRBC: 0 % (ref 0.0–0.2)

## 2020-05-02 LAB — PREPARE RBC (CROSSMATCH)

## 2020-05-02 MED ORDER — SODIUM CHLORIDE 0.9 % IV SOLN: 10.0000 mL/h | Freq: Once | INTRAVENOUS | Status: DC

## 2020-05-02 NOTE — Discharge Instructions (Addendum)
We gave you transfusion of 1 unit of blood in the emergency department today.  Please talk to your doctor about scheduling regular outpatient transfusion so that you are not needing to come to the emergency department.  You need to follow-up with a GI doctor as scheduled at the end of the month.  It is possible that your anemia is coming from a chronic, slow internal bleed in your bowels.

## 2020-05-02 NOTE — ED Notes (Signed)
Increased rate to 140 mL/hr. Pt stated that she felt fine and was doing better.

## 2020-05-02 NOTE — ED Provider Notes (Signed)
[facility] EMERGENCY DEPARTMENT Provider Note   CSN: [id number] Arrival date & time: 05/02/20  1033     History No chief complaint on file.   Brandy Houston is a 64 y.o. female w/ ESRD on dialysis (Tues, Thurs, Sat), chronic anemia from suspected GI bleed, HTN, HLD, diabetes, presenting to the ED with fatigue and concern for anemia.  Patient was referred here after dialysis on Saturday with him noted that her hemoglobin is low.  She says she has been feeling fatigued, lightheaded and short of breath for about a week.  She says it feels very similar to her episodes of symptomatic anemia in the past, for which she is required a blood transfusion.  She states that her doctor suspect that she is having an upper GI bleed, she is scheduled for an endoscopy at the end of the month.  She takes a baby aspirin but no other blood thinning medicine.  She denies any changes in her bowel habits.  She denies any falls or syncope.  She has not missed any dialysis sessions.  She does make urine.   HPI     Past Medical History:  Diagnosis Date  . Allergy   . Anemia   . Anxiety   . Arthritis    "in my joints" (03/10/2017)  . Chronic diastolic CHF (congestive heart failure) ([place])   . CKD (chronic kidney disease), stage IV ([place])    stage IV. previous HD, none currently 10/30/15 (confirmed 02/05/2017 & 03/10/2017)  . Depression    Chronic  . Diabetic peripheral neuropathy ([place])   . Dysrhythmia    tachycardia, normal ECHO 08-09-14  . GERD (gastroesophageal reflux disease)   . Heart murmur     dx'd 02/05/2017  . Hepatitis C    "tx'd in 2016; I'm negative now" (02/05/2017)  . History of blood transfusion 2017   "w/knee replacement"  . Hyperlipidemia   . Hypertension   . Osteoarthritis of left knee   . Peripheral neuropathy   . Protein calorie malnutrition ([place])   . Septic arthritis ([place])   . Slow transit constipation   . Symptomatic anemia 01/27/2020  . Type II diabetes  mellitus ([name])    IDDM  . Uncontrolled hypertension 02/18/2013  . Unsteady gait     Patient Active Problem List   Diagnosis Date Noted  . Acute combined systolic and diastolic heart failure ([place])   . Unstable angina ([place])   . Symptomatic anemia 01/27/2020  . Hypercalcemia 10/06/2019  . QT prolongation 06/05/2019  . Moderate protein-calorie malnutrition ([place]) 05/11/2018  . End stage renal disease ([place]) 04/19/2018  . Iron deficiency anemia, unspecified 04/19/2018  . Nephrotic syndrome with unspecified morphologic changes 04/19/2018  . Secondary hyperparathyroidism of renal origin ([place]) 04/19/2018  . Tobacco use 04/19/2018  . Benign essential HTN   . Above knee amputation status, left   . Anemia, blood loss   . DM type 2 causing ESRD ([place]) 03/21/2017  . Anemia in chronic kidney disease 02/05/2017  . Chronic diastolic CHF (congestive heart failure) ([place]) 11/08/2015  . Diabetic peripheral neuropathy ([place])   . Major depression, recurrent, chronic ([place])   . Chronic kidney disease (CKD), stage IV (severe) ([place])   . Hepatitis C 08/16/2014  . Peripheral neuropathy 06/24/2013    Past Surgical History:  Procedure Laterality Date  . A/V FISTULAGRAM Right 07/07/2018   Procedure: A/V FISTULAGRAM;  Surgeon: Marty Heck, MD;  Location: [facility];  Service: Cardiovascular;  Laterality:  Right;  . AMPUTATION Left 03/20/2017   Procedure: LEFT ABOVE KNEE AMPUTATION;  Surgeon: Newt Minion, MD;  Location: [facility];  Service: Orthopedics;  Laterality: Left;  . APPENDECTOMY    . AV FISTULA PLACEMENT Left 09/13/2014   Procedure: Brachial Artery to Brachial Vein Gortex Four - Seven Stretch GRAFT INSERTION Left Forearm;  Surgeon: Mal Misty, MD;  Location: [facility];  Service: Vascular;  Laterality: Left;  . BASCILIC VEIN TRANSPOSITION Right 10/26/2017   Procedure: BASILIC VEIN TRANSPOSITION FIRST STAGE RIGHT ARM;  Surgeon: Conrad Kingston, MD;  Location: [facility];  Service: Vascular;   Laterality: Right;  . BASCILIC VEIN TRANSPOSITION Right 02/01/2018   Procedure: SECOND STAGE BASILIC VEIN TRANSPOSITION RIGHT UPPER EXTREMITY;  Surgeon: Marty Heck, MD;  Location: [facility];  Service: Vascular;  Laterality: Right;  . CHOLECYSTECTOMY OPEN    . COLON SURGERY    . COLONOSCOPY    . EXCISIONAL TOTAL KNEE ARTHROPLASTY WITH ANTIBIOTIC SPACERS Left 02/06/2017   Procedure: Incisional total iknee with antibiotic spacer ;  Surgeon: Leandrew Koyanagi, MD;  Location: [facility];  Service: Orthopedics;  Laterality: Left;  . INTRAVASCULAR PRESSURE WIRE/FFR STUDY N/A 04/13/2020   Procedure: INTRAVASCULAR PRESSURE WIRE/FFR STUDY;  Surgeon: Burnell Blanks, MD;  Location: [facility];  Service: Cardiovascular;  Laterality: N/A;  . IR FLUORO GUIDE CV LINE RIGHT  02/10/2017  . IR REMOVAL TUN CV CATH W/O FL  04/01/2017  . IR US GUIDE VASC ACCESS RIGHT  02/10/2017  . IRRIGATION AND DEBRIDEMENT KNEE Left 03/12/2017   Procedure: IRRIGATION AND DEBRIDEMENT LEFT KNEE WITH WOUND VAC APPLICATION;  Surgeon: Leandrew Koyanagi, MD;  Location: [facility];  Service: Orthopedics;  Laterality: Left;  . JOINT REPLACEMENT    . KNEE ARTHROSCOPY Right 08/10/2014   Procedure: ARTHROSCOPY I & D KNEE;  Surgeon: Marianna Payment, MD;  Location[facility];  Service: Orthopedics;  Laterality: Right;  . KNEE ARTHROSCOPY Left 08/11/2014   Procedure: ARTHROSCOPIC WASHOUT LEFT KNEE;  Surgeon: Marianna Payment, MD;  Location: [facility];  Service: Orthopedics;  Laterality: Left;  . KNEE ARTHROSCOPY Left 08/19/2014   Procedure: ARTHROSCOPIC WASHOUT LEFT KNEE;  Surgeon: Leandrew Koyanagi, MD;  Location: [facility];  Service: Orthopedics;  Laterality: Left;  . KNEE ARTHROSCOPY WITH LATERAL MENISECTOMY Left 04/04/2015   Procedure: AND PARTIAL LATERAL MENISECTOMY;  Surgeon: Leandrew Koyanagi, MD;  Location: [facility];  Service: Orthopedics;  Laterality: Left;  . KNEE ARTHROSCOPY WITH MEDIAL MENISECTOMY Left 04/04/2015   Procedure: LEFT  KNEE ARTHROSCOPY WITH PARTIAL MEDIAL MENISCECTOMY  AND SYNOVECTOMY;  Surgeon: Leandrew Koyanagi, MD;  Location: [facility];  Service: Orthopedics;  Laterality: Left;  . PERIPHERAL VASCULAR BALLOON ANGIOPLASTY  07/07/2018   Procedure: PERIPHERAL VASCULAR BALLOON ANGIOPLASTY;  Surgeon: Marty Heck, MD;  Location: [facility];  Service: Cardiovascular;;  right AV fistula  . POLYPECTOMY    . RIGHT/LEFT HEART CATH AND CORONARY ANGIOGRAPHY N/A 04/13/2020   Procedure: RIGHT/LEFT HEART CATH AND CORONARY ANGIOGRAPHY;  Surgeon: Burnell Blanks, MD;  Location: [facility];  Service: Cardiovascular;  Laterality: N/A;  . SHOULDER ARTHROSCOPY Bilateral 08/10/2014   Procedure: I & D BILATERAL SHOULDERS ;  Surgeon: Marianna Payment, MD;  Location[facility];  Service: Orthopedics;  Laterality: Bilateral;  . SMALL INTESTINE SURGERY     Due to Small Bowel Obstruction; "fixed it when they did my gallbladder OR"  . TEE WITHOUT CARDIOVERSION N/A  08/14/2014   Procedure: TRANSESOPHAGEAL ECHOCARDIOGRAM (TEE);  Surgeon: Thayer Headings, MD;  Location: [facility];  Service: Cardiovascular;  Laterality: N/A;  . TENOSYNOVECTOMY Right 08/11/2014   Procedure: RIGHT WRIST IRRIGATION AND DEBRIDEMENT, TENOSYNOVECTOMY;  Surgeon: Marianna Payment, MD;  Location: [facility];  Service: Orthopedics;  Laterality: Right;  . TOTAL KNEE ARTHROPLASTY Left 11/07/2015   Procedure: LEFT TOTAL KNEE ARTHROPLASTY WITH REVISION OF IMPLANTS;  Surgeon: Leandrew Koyanagi, MD;  Location: [facility];  Service: Orthopedics;  Laterality: Left;  . TUBAL LIGATION       OB History    Gravida  5   Para  0   Term  0   Preterm      AB  2   Living  3     SAB  1   TAB  1   Ectopic      Multiple      Live Births              Family History  Problem Relation Age of Onset  . Uterine cancer Mother   . Heart disease Father   . Hypertension Sister   . Uterine cancer Sister   . Cancer Brother        unsure  type  . Diabetes Brother   . Hypertension Brother   . Colon cancer Neg Hx   . Colon polyps Neg Hx   . Esophageal cancer Neg Hx   . Rectal cancer Neg Hx   . Stomach cancer Neg Hx     Social History   Tobacco Use  . Smoking status: Current Every Day Smoker    Packs/day: 0.50    Years: 35.00    Pack years: 17.50    Types: Cigarettes  . Smokeless tobacco: Never Used  Vaping Use  . Vaping Use: Never used  Substance Use Topics  . Alcohol use: Not Currently    Alcohol/week: 0.0 standard drinks    Comment: 03/10/2017 "I drink a wine cooler a few times/year"  . Drug use: No    Home Medications Prior to Admission medications   Medication Sig Start Date End Date Taking? Authorizing Provider  acetaminophen (TYLENOL) 500 MG tablet Take 500-1,000 mg by mouth every 6 (six) hours as needed (for pain.).   Yes [provider]  aspirin EC 81 MG tablet Take 81 mg by mouth daily.   Yes [provider]  atorvastatin (LIPITOR) 10 MG tablet TAKE 1 TABLET BY MOUTH AT BEDTIME . APPOINTMENT REQUIRED FOR FUTURE REFILLS Patient taking differently: Take 10 mg by mouth at bedtime.  10/31/19  Yes [name], MD  calcium acetate (PHOSLO) 667 MG capsule Take 1,334-2,001 mg by mouth See admin instructions. Take 3 capsules (2001 mg) by mouth with breakfast, take 2 capsules (1334 mg) by mouth with lunch, take 3 capsules (2001 mg) by mouth with supper & take 2 capsules (1334 mg) by mouth with snacks. 01/13/19  Yes [provider]  carvedilol (COREG) 3.125 MG tablet Take 1 tablet (3.125 mg total) by mouth 2 (two) times daily. 04/04/20 07/03/20 Yes Donato Heinz, MD  cloNIDine (CATAPRES) 0.1 MG tablet Take 0.1 mg by mouth every 12 (twelve) hours. 03/29/20  Yes [provider]  diphenhydrAMINE (BENADRYL) 25 mg capsule Take 25 mg by mouth every 6 (six) hours as needed for allergies.   Yes [provider]  gabapentin (NEURONTIN) 100 MG capsule Take 200 mg by mouth  See admin instructions. Take 2 capsules (200 mg) by  mouth 3 times on Sundays, Mondays, Wednesdays, & Fridays. 11/01/18  Yes [provider]  gabapentin (NEURONTIN) 300 MG capsule Take 300 mg by mouth See admin instructions. Take 1 capsule (300 mg) by mouth twice daily on Tuesdays, Thursdays, & Saturdays.   Yes [provider]  hydrALAZINE (APRESOLINE) 100 MG tablet Take 100 mg by mouth in the morning and at bedtime.    Yes [provider]  isosorbide mononitrate (IMDUR) 30 MG 24 hr tablet Take 1 tablet (30 mg total) by mouth daily. 04/16/20 07/15/20 Yes Cheryln Manly, NP  lidocaine-prilocaine (EMLA) cream Apply 1 application topically Every Tuesday,Thursday,and Saturday with dialysis. On days of dialysis 3x week 10/25/18  Yes [provider]  methocarbamol (ROBAXIN) 500 MG tablet Take 500 mg by mouth every 8 (eight) hours as needed for muscle spasms.  06/08/19  Yes [provider]  Methoxy PEG-Epoetin Beta (MIRCERA IJ) Get it at dialysis 06/30/19 06/28/20 Yes [provider]  multivitamin (RENA-VIT) TABS tablet Take 1 tablet by mouth daily. 03/01/19  Yes [provider]  pantoprazole (PROTONIX) 20 MG tablet Take 20 mg by mouth 2 (two) times daily. 03/29/20  Yes [provider]  polyethylene glycol (MIRALAX / GLYCOLAX) 17 g packet Take 17 g by mouth daily as needed for moderate constipation. 12/10/18  Yes [name], MD  sertraline (ZOLOFT) 100 MG tablet Take 1 tablet by mouth once daily Patient taking differently: Take 100 mg by mouth every evening.  03/26/20  Yes [name], MD  traZODone (DESYREL) 50 MG tablet Take 50 mg by mouth at bedtime as needed for sleep.  10/27/18  Yes [provider]  Accu-Chek Softclix Lancets lancets Check sugars three times a day for E11.65 09/20/18   [name], MD  blood glucose meter kit and supplies KIT Dispense based on patient and insurance preference. Use up to four times daily  as directed 10/13/19   [name], MD  Blood Glucose Monitoring Suppl (ACCU-CHEK GUIDE) w/Device KIT 1 Device by Does not apply route daily. 10/31/19   [name], MD  glucose blood (ACCU-CHEK GUIDE) test strip Check blood sugar up to 4 times a day 10/20/19   [name], MD  Lancet Devices [facility]) lancets Check sugars TID for E11.65 01/29/15   Lance Bosch, NP  nitroGLYCERIN (NITROSTAT) 0.4 MG SL tablet Place 1 tablet (0.4 mg total) under the tongue every 5 (five) minutes as needed. 04/25/20 07/24/20  Donato Heinz, MD    Allergies    Compazine [prochlorperazine], Shellfish-derived products, Iodinated diagnostic agents, Omnipaque [iohexol], Tape, and Sulfa antibiotics  Review of Systems   Review of Systems  Constitutional: Positive for fatigue. Negative for fever.  HENT: Negative for ear pain and sore throat.   Eyes: Negative for pain and visual disturbance.  Respiratory: Negative for cough and shortness of breath.   Cardiovascular: Negative for chest pain and palpitations.  Gastrointestinal: Negative for abdominal pain, nausea and vomiting.  Musculoskeletal: Negative for arthralgias and back pain.  Skin: Negative for color change and rash.  Neurological: Positive for light-headedness. Negative for syncope.  Psychiatric/Behavioral: Negative for agitation and confusion.  All other systems reviewed and are negative.   Physical Exam Updated Vital Signs BP (!) 165/83   Pulse 88   Temp 98.1 F (36.7 C) (Oral)   Resp 17   LMP 03/16/2002   SpO2 100%   Physical Exam Vitals and nursing note reviewed.  Constitutional:  General: She is not in acute distress.    Appearance: She is well-developed.  HENT:     Head: Normocephalic and atraumatic.  Eyes:     Conjunctiva/sclera: Conjunctivae normal.     Comments: Conjunctival pallor  Cardiovascular:     Rate and Rhythm: Normal rate and regular rhythm.     Pulses: Normal pulses.  Pulmonary:      Effort: Pulmonary effort is normal. No respiratory distress.  Abdominal:     Palpations: Abdomen is soft.     Tenderness: There is no abdominal tenderness.  Musculoskeletal:     Cervical back: Neck supple.  Skin:    General: Skin is warm and dry.  Neurological:     General: No focal deficit present.     Mental Status: She is alert and oriented to person, place, and time.  Psychiatric:        Mood and Affect: Mood normal.        Behavior: Behavior normal.     ED Results / Procedures / Treatments   Labs (all labs ordered are listed, but only abnormal results are displayed) Labs Reviewed  CBC - Abnormal; Notable for the following components:      Result Value   WBC 3.9 (*)    RBC 2.22 (*)    Hemoglobin 6.6 (*)    HCT 23.0 (*)    MCV 103.6 (*)    MCHC 28.7 (*)    RDW 20.5 (*)    All other components within normal limits  COMPREHENSIVE METABOLIC PANEL - Abnormal; Notable for the following components:   Potassium 3.4 (*)    Glucose, Bld 127 (*)    BUN 26 (*)    Creatinine, Ser 4.09 (*)    Calcium 8.2 (*)    Total Protein 6.0 (*)    Albumin 2.9 (*)    Total Bilirubin 0.1 (*)    GFR, Estimated 12 (*)    All other components within normal limits  TYPE AND SCREEN  PREPARE RBC (CROSSMATCH)    EKG EKG Interpretation  Date/Time:  Wednesday May 02 2020 10:53:35 EST Ventricular Rate:  86 PR Interval:  160 QRS Duration: 88 QT Interval:  378 QTC Calculation: 452 R Axis:   77 Text Interpretation: Sinus rhythm with Premature atrial complexes T wave abnormality, consider inferolateral ischemia Abnormal ECG NO STEMI Confirmed by Octaviano Glow [phone]) on 05/02/2020 11:51:28 AM   Radiology No results found.  Procedures .Critical Care Performed by: Wyvonnia Dusky, MD Authorized by: Wyvonnia Dusky, MD   Critical care provider statement:    Critical care time (minutes):  40   Critical care was necessary to treat or prevent imminent or life-threatening  deterioration of the following conditions:  Circulatory failure   Critical care was time spent personally by me on the following activities:  Discussions with consultants, evaluation of patient's response to treatment, examination of patient, ordering and performing treatments and interventions, ordering and review of laboratory studies, ordering and review of radiographic studies, pulse oximetry, re-evaluation of patient's condition, obtaining history from patient or surrogate and review of old charts Comments:     Symptomatic anemia requiring IV blood transfusion   (including critical care time)  Medications Ordered in ED Medications  0.9 %  sodium chloride infusion (has no administration in time range)    ED Course  I have reviewed the triage vital signs and the nursing notes.  Pertinent labs & imaging results that were available during my care of the patient  were reviewed by me and considered in my medical decision making (see chart for details).  This is a 64 year old female presenting to emergency department symptomatic anemia.  Her hemoglobin is 6.6 today.  She is feeling fatigue.  She said similar episodes in the past which is felt to be secondary to her chronic GI bleed.  She is scheduled for endoscopy at the end of the month.  She is otherwise hemodynamically stable.  Have a low suspicion for active GI hemorrhage.  I do think is reasonable to give her 1 unit of packed red blood cells while she is in the emergency department.  She was consented for the transfusion.  She otherwise would be stable for discharge home.  I advised that she discuss with her gastroenterologist or primary care doctor setting up for chronic outpatient transfusions, as she feels like she is needing 1 every month    Final Clinical Impression(s) / ED Diagnoses Final diagnoses:  Anemia, unspecified type    Rx / DC Orders ED Discharge Orders    None       Tyran Huser, Carola Rhine, MD 05/02/20 1735

## 2020-05-02 NOTE — ED Triage Notes (Signed)
Pt here for possible blood transfusion , pt was at dialysis yesterday and was told today that her hgb was low

## 2020-05-02 NOTE — ED Notes (Signed)
MD notified of lab values

## 2020-05-02 NOTE — ED Notes (Signed)
Consent signed.

## 2020-05-03 DIAGNOSIS — D509 Iron deficiency anemia, unspecified: Secondary | ICD-10-CM | POA: Diagnosis not present

## 2020-05-03 DIAGNOSIS — Z992 Dependence on renal dialysis: Secondary | ICD-10-CM | POA: Diagnosis not present

## 2020-05-03 DIAGNOSIS — N2581 Secondary hyperparathyroidism of renal origin: Secondary | ICD-10-CM | POA: Diagnosis not present

## 2020-05-03 DIAGNOSIS — D631 Anemia in chronic kidney disease: Secondary | ICD-10-CM | POA: Diagnosis not present

## 2020-05-03 DIAGNOSIS — E1129 Type 2 diabetes mellitus with other diabetic kidney complication: Secondary | ICD-10-CM | POA: Diagnosis not present

## 2020-05-03 DIAGNOSIS — N186 End stage renal disease: Secondary | ICD-10-CM | POA: Diagnosis not present

## 2020-05-03 LAB — TYPE AND SCREEN
ABO/RH(D): A POS
Antibody Screen: NEGATIVE
Unit division: 0

## 2020-05-03 LAB — BPAM RBC
Blood Product Expiration Date: 202112132359
ISSUE DATE / TIME: 202111171400
Unit Type and Rh: 6200

## 2020-05-05 DIAGNOSIS — Z992 Dependence on renal dialysis: Secondary | ICD-10-CM | POA: Diagnosis not present

## 2020-05-05 DIAGNOSIS — N186 End stage renal disease: Secondary | ICD-10-CM | POA: Diagnosis not present

## 2020-05-05 DIAGNOSIS — N2581 Secondary hyperparathyroidism of renal origin: Secondary | ICD-10-CM | POA: Diagnosis not present

## 2020-05-05 DIAGNOSIS — D509 Iron deficiency anemia, unspecified: Secondary | ICD-10-CM | POA: Diagnosis not present

## 2020-05-05 DIAGNOSIS — D631 Anemia in chronic kidney disease: Secondary | ICD-10-CM | POA: Diagnosis not present

## 2020-05-05 DIAGNOSIS — E1129 Type 2 diabetes mellitus with other diabetic kidney complication: Secondary | ICD-10-CM | POA: Diagnosis not present

## 2020-05-07 DIAGNOSIS — D509 Iron deficiency anemia, unspecified: Secondary | ICD-10-CM | POA: Diagnosis not present

## 2020-05-07 DIAGNOSIS — E1129 Type 2 diabetes mellitus with other diabetic kidney complication: Secondary | ICD-10-CM | POA: Diagnosis not present

## 2020-05-07 DIAGNOSIS — Z992 Dependence on renal dialysis: Secondary | ICD-10-CM | POA: Diagnosis not present

## 2020-05-07 DIAGNOSIS — N2581 Secondary hyperparathyroidism of renal origin: Secondary | ICD-10-CM | POA: Diagnosis not present

## 2020-05-07 DIAGNOSIS — D631 Anemia in chronic kidney disease: Secondary | ICD-10-CM | POA: Diagnosis not present

## 2020-05-07 DIAGNOSIS — N186 End stage renal disease: Secondary | ICD-10-CM | POA: Diagnosis not present

## 2020-05-09 ENCOUNTER — Other Ambulatory Visit: Payer: Self-pay

## 2020-05-09 ENCOUNTER — Ambulatory Visit (HOSPITAL_COMMUNITY): Payer: Medicare Other | Attending: Cardiology

## 2020-05-09 DIAGNOSIS — N2581 Secondary hyperparathyroidism of renal origin: Secondary | ICD-10-CM | POA: Diagnosis not present

## 2020-05-09 DIAGNOSIS — I313 Pericardial effusion (noninflammatory): Secondary | ICD-10-CM | POA: Diagnosis not present

## 2020-05-09 DIAGNOSIS — D509 Iron deficiency anemia, unspecified: Secondary | ICD-10-CM | POA: Diagnosis not present

## 2020-05-09 DIAGNOSIS — D631 Anemia in chronic kidney disease: Secondary | ICD-10-CM | POA: Diagnosis not present

## 2020-05-09 DIAGNOSIS — Z992 Dependence on renal dialysis: Secondary | ICD-10-CM | POA: Diagnosis not present

## 2020-05-09 DIAGNOSIS — N186 End stage renal disease: Secondary | ICD-10-CM | POA: Diagnosis not present

## 2020-05-09 DIAGNOSIS — E1129 Type 2 diabetes mellitus with other diabetic kidney complication: Secondary | ICD-10-CM | POA: Diagnosis not present

## 2020-05-09 DIAGNOSIS — I3139 Other pericardial effusion (noninflammatory): Secondary | ICD-10-CM

## 2020-05-09 LAB — ECHOCARDIOGRAM LIMITED
AR max vel: 1.59 cm2
AV Area VTI: 1.53 cm2
AV Area mean vel: 1.52 cm2
AV Mean grad: 18 mmHg
AV Peak grad: 32.9 mmHg
Ao pk vel: 2.87 m/s
Area-P 1/2: 4.6 cm2
P 1/2 time: 310 msec
S' Lateral: 4 cm

## 2020-05-11 ENCOUNTER — Other Ambulatory Visit (HOSPITAL_COMMUNITY)
Admission: RE | Admit: 2020-05-11 | Discharge: 2020-05-11 | Disposition: A | Discharge status: Home / Self Care | Payer: Medicare Other | Source: Ambulatory Visit | Attending: Gastroenterology | Admitting: Gastroenterology

## 2020-05-11 DIAGNOSIS — Z01818 Encounter for other preprocedural examination: Secondary | ICD-10-CM | POA: Diagnosis not present

## 2020-05-11 DIAGNOSIS — Z20822 Contact with and (suspected) exposure to covid-19: Secondary | ICD-10-CM | POA: Diagnosis not present

## 2020-05-11 LAB — SARS CORONAVIRUS 2 (TAT 6-24 HRS): SARS Coronavirus 2: NEGATIVE

## 2020-05-12 DIAGNOSIS — N2581 Secondary hyperparathyroidism of renal origin: Secondary | ICD-10-CM | POA: Diagnosis not present

## 2020-05-12 DIAGNOSIS — E1129 Type 2 diabetes mellitus with other diabetic kidney complication: Secondary | ICD-10-CM | POA: Diagnosis not present

## 2020-05-12 DIAGNOSIS — D631 Anemia in chronic kidney disease: Secondary | ICD-10-CM | POA: Diagnosis not present

## 2020-05-12 DIAGNOSIS — D509 Iron deficiency anemia, unspecified: Secondary | ICD-10-CM | POA: Diagnosis not present

## 2020-05-12 DIAGNOSIS — N186 End stage renal disease: Secondary | ICD-10-CM | POA: Diagnosis not present

## 2020-05-12 DIAGNOSIS — Z992 Dependence on renal dialysis: Secondary | ICD-10-CM | POA: Diagnosis not present

## 2020-05-14 ENCOUNTER — Ambulatory Visit (HOSPITAL_COMMUNITY)
Admission: RE | Admit: 2020-05-14 | Discharge: 2020-05-14 | Disposition: A | Discharge status: Home / Self Care | Payer: Medicare Other | Source: Ambulatory Visit | Attending: Gastroenterology | Admitting: Gastroenterology

## 2020-05-14 ENCOUNTER — Encounter (HOSPITAL_COMMUNITY): Payer: Self-pay | Admitting: Gastroenterology

## 2020-05-14 ENCOUNTER — Ambulatory Visit (HOSPITAL_COMMUNITY): Payer: Medicare Other | Admitting: Certified Registered"

## 2020-05-14 ENCOUNTER — Other Ambulatory Visit: Payer: Self-pay

## 2020-05-14 ENCOUNTER — Encounter (HOSPITAL_COMMUNITY)
Admission: RE | Disposition: A | Discharge status: Home / Self Care | Payer: Self-pay | Source: Ambulatory Visit | Attending: Gastroenterology

## 2020-05-14 DIAGNOSIS — I130 Hypertensive heart and chronic kidney disease with heart failure and stage 1 through stage 4 chronic kidney disease, or unspecified chronic kidney disease: Secondary | ICD-10-CM | POA: Diagnosis not present

## 2020-05-14 DIAGNOSIS — K209 Esophagitis, unspecified without bleeding: Secondary | ICD-10-CM | POA: Insufficient documentation

## 2020-05-14 DIAGNOSIS — B3781 Candidal esophagitis: Secondary | ICD-10-CM | POA: Diagnosis not present

## 2020-05-14 DIAGNOSIS — I5041 Acute combined systolic (congestive) and diastolic (congestive) heart failure: Secondary | ICD-10-CM | POA: Diagnosis not present

## 2020-05-14 DIAGNOSIS — N186 End stage renal disease: Secondary | ICD-10-CM | POA: Diagnosis not present

## 2020-05-14 DIAGNOSIS — Z96652 Presence of left artificial knee joint: Secondary | ICD-10-CM | POA: Insufficient documentation

## 2020-05-14 DIAGNOSIS — K31811 Angiodysplasia of stomach and duodenum with bleeding: Secondary | ICD-10-CM | POA: Diagnosis not present

## 2020-05-14 DIAGNOSIS — Z89612 Acquired absence of left leg above knee: Secondary | ICD-10-CM | POA: Insufficient documentation

## 2020-05-14 DIAGNOSIS — F17210 Nicotine dependence, cigarettes, uncomplicated: Secondary | ICD-10-CM | POA: Insufficient documentation

## 2020-05-14 DIAGNOSIS — Z7982 Long term (current) use of aspirin: Secondary | ICD-10-CM | POA: Diagnosis not present

## 2020-05-14 DIAGNOSIS — Z79899 Other long term (current) drug therapy: Secondary | ICD-10-CM | POA: Diagnosis not present

## 2020-05-14 DIAGNOSIS — E1122 Type 2 diabetes mellitus with diabetic chronic kidney disease: Secondary | ICD-10-CM | POA: Diagnosis not present

## 2020-05-14 HISTORY — PX: ESOPHAGOGASTRODUODENOSCOPY (EGD) WITH PROPOFOL: SHX5813

## 2020-05-14 HISTORY — PX: HOT HEMOSTASIS: SHX5433

## 2020-05-14 LAB — GLUCOSE, CAPILLARY: Glucose-Capillary: 114 mg/dL — ABNORMAL HIGH (ref 70–99)

## 2020-05-14 SURGERY — ESOPHAGOGASTRODUODENOSCOPY (EGD) WITH PROPOFOL
Anesthesia: Monitor Anesthesia Care

## 2020-05-14 MED ORDER — PANTOPRAZOLE SODIUM 20 MG PO TBEC: 20.0000 mg | DELAYED_RELEASE_TABLET | Freq: Two times a day (BID) | ORAL | 11 refills | Status: DC

## 2020-05-14 MED ORDER — SODIUM CHLORIDE 0.9 % IV SOLN: INTRAVENOUS | Status: DC

## 2020-05-14 MED ORDER — LIDOCAINE 2% (20 MG/ML) 5 ML SYRINGE
INTRAMUSCULAR | Status: DC | PRN
  Administered 2020-05-14: 60 mg via INTRAVENOUS

## 2020-05-14 MED ORDER — PROPOFOL 500 MG/50ML IV EMUL
INTRAVENOUS | Status: AC
  Filled 2020-05-14: qty 50

## 2020-05-14 MED ORDER — PROPOFOL 10 MG/ML IV BOLUS
INTRAVENOUS | Status: DC | PRN
  Administered 2020-05-14: 20 mg via INTRAVENOUS

## 2020-05-14 MED ORDER — PROPOFOL 500 MG/50ML IV EMUL
INTRAVENOUS | Status: DC | PRN
  Administered 2020-05-14: 125 ug/kg/min via INTRAVENOUS

## 2020-05-14 SURGICAL SUPPLY — 14 items

## 2020-05-14 NOTE — H&P (Signed)
[facility] History and Physical   Primary Care Physician:  [name], MD   Reason for Procedure:  Gastric antral vascular ectasia Plan:    EGD with APC     HPI: Brandy Houston is a 64 y.o. female with ESRD on HD, GAVE here for EGD with APC The risks and benefits as well as alternatives of endoscopic procedure(s) have been discussed and reviewed. All questions answered. The patient agrees to proceed.    Past Medical History:  Diagnosis Date  . Allergy   . Anemia   . Anxiety   . Arthritis    "in my joints" (03/10/2017)  . Chronic diastolic CHF (congestive heart failure) ([place])   . CKD (chronic kidney disease), stage IV ([place])    stage IV. previous HD, none currently 10/30/15 (confirmed 02/05/2017 & 03/10/2017)  . Depression    Chronic  . Diabetic peripheral neuropathy ([place])   . Dysrhythmia    tachycardia, normal ECHO 08-09-14  . GERD (gastroesophageal reflux disease)   . Heart murmur     dx'd 02/05/2017  . Hepatitis C    "tx'd in 2016; I'm negative now" (02/05/2017)  . History of blood transfusion 2017   "w/knee replacement"  . Hyperlipidemia   . Hypertension   . Osteoarthritis of left knee   . Peripheral neuropathy   . Protein calorie malnutrition ([place])   . Septic arthritis ([place])   . Slow transit constipation   . Symptomatic anemia 01/27/2020  . Type II diabetes mellitus ([name])    IDDM  . Uncontrolled hypertension 02/18/2013  . Unsteady gait     Past Surgical History:  Procedure Laterality Date  . A/V FISTULAGRAM Right 07/07/2018   Procedure: A/V FISTULAGRAM;  Surgeon: Marty Heck, MD;  Location: [facility];  Service: Cardiovascular;  Laterality: Right;  . AMPUTATION Left 03/20/2017   Procedure: LEFT ABOVE KNEE AMPUTATION;  Surgeon: Newt Minion, MD;  Location: [place];  Service: Orthopedics;  Laterality: Left;  . APPENDECTOMY    . AV FISTULA PLACEMENT Left 09/13/2014   Procedure: Brachial Artery to Brachial Vein Gortex Four - Seven  Stretch GRAFT INSERTION Left Forearm;  Surgeon: Mal Misty, MD;  Location: [place];  Service: Vascular;  Laterality: Left;  . BASCILIC VEIN TRANSPOSITION Right 10/26/2017   Procedure: BASILIC VEIN TRANSPOSITION FIRST STAGE RIGHT ARM;  Surgeon: Conrad Paden, MD;  Location: [place];  Service: Vascular;  Laterality: Right;  . BASCILIC VEIN TRANSPOSITION Right 02/01/2018   Procedure: SECOND STAGE BASILIC VEIN TRANSPOSITION RIGHT UPPER EXTREMITY;  Surgeon: Marty Heck, MD;  Location: [place];  Service: Vascular;  Laterality: Right;  . CHOLECYSTECTOMY OPEN    . COLON SURGERY    . COLONOSCOPY    . EXCISIONAL TOTAL KNEE ARTHROPLASTY WITH ANTIBIOTIC SPACERS Left 02/06/2017   Procedure: Incisional total iknee with antibiotic spacer ;  Surgeon: Leandrew Koyanagi, MD;  Location: [place];  Service: Orthopedics;  Laterality: Left;  . INTRAVASCULAR PRESSURE WIRE/FFR STUDY N/A 04/13/2020   Procedure: INTRAVASCULAR PRESSURE WIRE/FFR STUDY;  Surgeon: Burnell Blanks, MD;  Location: [facility];  Service: Cardiovascular;  Laterality: N/A;  . IR FLUORO GUIDE CV LINE RIGHT  02/10/2017  . IR REMOVAL TUN CV CATH W/O FL  04/01/2017  . IR US GUIDE VASC ACCESS RIGHT  02/10/2017  . IRRIGATION AND DEBRIDEMENT KNEE Left 03/12/2017   Procedure: IRRIGATION AND DEBRIDEMENT LEFT KNEE WITH WOUND VAC APPLICATION;  Surgeon: Leandrew Koyanagi, MD;  Location:  [facility];  Service: Orthopedics;  Laterality: Left;  . JOINT REPLACEMENT    . KNEE ARTHROSCOPY Right 08/10/2014   Procedure: ARTHROSCOPY I & D KNEE;  Surgeon: Marianna Payment, MD;  Location: [facility];  Service: Orthopedics;  Laterality: Right;  . KNEE ARTHROSCOPY Left 08/11/2014   Procedure: ARTHROSCOPIC WASHOUT LEFT KNEE;  Surgeon: Marianna Payment, MD;  Location: [place];  Service: Orthopedics;  Laterality: Left;  . KNEE ARTHROSCOPY Left 08/19/2014   Procedure: ARTHROSCOPIC WASHOUT LEFT KNEE;  Surgeon: Leandrew Koyanagi, MD;  Location: [place];  Service: Orthopedics;  Laterality:  Left;  . KNEE ARTHROSCOPY WITH LATERAL MENISECTOMY Left 04/04/2015   Procedure: AND PARTIAL LATERAL MENISECTOMY;  Surgeon: Leandrew Koyanagi, MD;  Location: [place];  Service: Orthopedics;  Laterality: Left;  . KNEE ARTHROSCOPY WITH MEDIAL MENISECTOMY Left 04/04/2015   Procedure: LEFT KNEE ARTHROSCOPY WITH PARTIAL MEDIAL MENISCECTOMY  AND SYNOVECTOMY;  Surgeon: Leandrew Koyanagi, MD;  Location: [place];  Service: Orthopedics;  Laterality: Left;  . PERIPHERAL VASCULAR BALLOON ANGIOPLASTY  07/07/2018   Procedure: PERIPHERAL VASCULAR BALLOON ANGIOPLASTY;  Surgeon: Marty Heck, MD;  Location: [place] [facility];  Service: Cardiovascular;;  right AV fistula  . POLYPECTOMY    . RIGHT/LEFT HEART CATH AND CORONARY ANGIOGRAPHY N/A 04/13/2020   Procedure: RIGHT/LEFT HEART CATH AND CORONARY ANGIOGRAPHY;  Surgeon: Burnell Blanks, MD;  Location: [facility];  Service: Cardiovascular;  Laterality: N/A;  . SHOULDER ARTHROSCOPY Bilateral 08/10/2014   Procedure: I & D BILATERAL SHOULDERS ;  Surgeon: Marianna Payment, MD;  Location: [facility];  Service: Orthopedics;  Laterality: Bilateral;  . SMALL INTESTINE SURGERY     Due to Small Bowel Obstruction; "fixed it when they did my gallbladder OR"  . TEE WITHOUT CARDIOVERSION N/A 08/14/2014   Procedure: TRANSESOPHAGEAL ECHOCARDIOGRAM (TEE);  Surgeon: Thayer Headings, MD;  Location: [place];  Service: Cardiovascular;  Laterality: N/A;  . TENOSYNOVECTOMY Right 08/11/2014   Procedure: RIGHT WRIST IRRIGATION AND DEBRIDEMENT, TENOSYNOVECTOMY;  Surgeon: Marianna Payment, MD;  Location: [place];  Service: Orthopedics;  Laterality: Right;  . TOTAL KNEE ARTHROPLASTY Left 11/07/2015   Procedure: LEFT TOTAL KNEE ARTHROPLASTY WITH REVISION OF IMPLANTS;  Surgeon: Leandrew Koyanagi, MD;  Location: [place];  Service: Orthopedics;  Laterality: Left;  . TUBAL LIGATION      Prior to Admission medications   Medication Sig Start Date End  Date Taking? Authorizing Provider  acetaminophen (TYLENOL) 500 MG tablet Take 500-1,000 mg by mouth every 6 (six) hours as needed (for pain.).   Yes [provider]  aspirin EC 81 MG tablet Take 81 mg by mouth daily.   Yes [provider]  calcium acetate (PHOSLO) 667 MG capsule Take 1,334-2,001 mg by mouth See admin instructions. Take 3 capsules (2001 mg) by mouth with breakfast, take 2 capsules (1334 mg) by mouth with lunch, take 3 capsules (2001 mg) by mouth with supper & take 2 capsules (1334 mg) by mouth with snacks. 01/13/19  Yes [provider]  cloNIDine (CATAPRES) 0.1 MG tablet Take 0.1 mg by mouth every 12 (twelve) hours. 03/29/20  Yes [provider]  diphenhydrAMINE (BENADRYL) 25 mg capsule Take 25 mg by mouth every 6 (six) hours as needed for allergies.   Yes [provider]  gabapentin (NEURONTIN) 100 MG capsule Take 200 mg by mouth See admin instructions. Take 2 capsules (200 mg) by mouth 3 times on Sundays, Mondays, Wednesdays, & Fridays.  11/01/18  Yes [provider]  gabapentin (NEURONTIN) 300 MG capsule Take 300 mg by mouth See admin instructions. Take 1 capsule (300 mg) by mouth twice daily on Tuesdays, Thursdays, & Saturdays.   Yes [provider]  hydrALAZINE (APRESOLINE) 100 MG tablet Take 100 mg by mouth in the morning and at bedtime.    Yes [provider]  isosorbide mononitrate (IMDUR) 30 MG 24 hr tablet Take 1 tablet (30 mg total) by mouth daily. 04/16/20 07/15/20 Yes Cheryln Manly, NP  methocarbamol (ROBAXIN) 500 MG tablet Take 500 mg by mouth every 8 (eight) hours as needed for muscle spasms.  06/08/19  Yes [provider]  multivitamin (RENA-VIT) TABS tablet Take 1 tablet by mouth daily. 03/01/19  Yes [provider]  pantoprazole (PROTONIX) 20 MG tablet Take 20 mg by mouth 2 (two) times daily. 03/29/20  Yes [provider]  sertraline (ZOLOFT) 100 MG tablet Take 1 tablet by  mouth once daily Patient taking differently: Take 100 mg by mouth every evening.  03/26/20  Yes [name], MD  traZODone (DESYREL) 50 MG tablet Take 50 mg by mouth at bedtime as needed for sleep.  10/27/18  Yes [provider]  Accu-Chek Softclix Lancets lancets Check sugars three times a day for E11.65 09/20/18   [name], MD  atorvastatin (LIPITOR) 10 MG tablet TAKE 1 TABLET BY MOUTH AT BEDTIME . APPOINTMENT REQUIRED FOR FUTURE REFILLS Patient taking differently: Take 10 mg by mouth at bedtime.  10/31/19   [name], MD  blood glucose meter kit and supplies KIT Dispense based on patient and insurance preference. Use up to four times daily as directed 10/13/19   [name], MD  Blood Glucose Monitoring Suppl (ACCU-CHEK GUIDE) w/Device KIT 1 Device by Does not apply route daily. 10/31/19   [name], MD  carvedilol (COREG) 3.125 MG tablet Take 1 tablet (3.125 mg total) by mouth 2 (two) times daily. 04/04/20 07/03/20  Donato Heinz, MD  glucose blood (ACCU-CHEK GUIDE) test strip Check blood sugar up to 4 times a day 10/20/19   [name], MD  Lancet Devices [facility]) lancets Check sugars TID for E11.65 01/29/15   Lance Bosch, NP  lidocaine-prilocaine (EMLA) cream Apply 1 application topically Every Tuesday,Thursday,and Saturday with dialysis. On days of dialysis 3x week 10/25/18   [provider]  Methoxy PEG-Epoetin Beta (MIRCERA IJ) Get it at dialysis 06/30/19 06/28/20  [provider]  nitroGLYCERIN (NITROSTAT) 0.4 MG SL tablet Place 1 tablet (0.4 mg total) under the tongue every 5 (five) minutes as needed. 04/25/20 07/24/20  Donato Heinz, MD  polyethylene glycol (MIRALAX / GLYCOLAX) 17 g packet Take 17 g by mouth daily as needed for moderate constipation. 12/10/18   [name], MD    Current Facility-Administered Medications  Medication Dose Route Frequency Provider Last Rate Last Admin  . 0.9 %  sodium  chloride infusion   Intravenous Continuous Mauri Pole, MD        Allergies as of 02/22/2020 - Review Complete 02/08/2020  Allergen Reaction Noted  . Compazine [prochlorperazine] Shortness Of Breath, Swelling, and Other (See Comments) 11/30/2012  . Shellfish-derived products Anaphylaxis 11/04/2013  . Iodinated diagnostic agents Hives and Rash 11/04/2013  . Omnipaque [iohexol] Hives 10/18/2011  . Tape  07/07/2018  . Sulfa antibiotics Rash 06/20/2013    Family History  Problem Relation Age of Onset  . Uterine cancer Mother   .  Heart disease Father   . Hypertension Sister   . Uterine cancer Sister   . Cancer Brother        unsure type  . Diabetes Brother   . Hypertension Brother   . Colon cancer Neg Hx   . Colon polyps Neg Hx   . Esophageal cancer Neg Hx   . Rectal cancer Neg Hx   . Stomach cancer Neg Hx     Social History   Socioeconomic History  . Marital status: Single    Spouse name: Not on file  . Number of children: 3  . Years of education: Not on file  . Highest education level: Not on file  Occupational History  . Occupation: disabled  Tobacco Use  . Smoking status: Current Every Day Smoker    Packs/day: 0.50    Years: 35.00    Pack years: 17.50    Types: Cigarettes  . Smokeless tobacco: Never Used  Vaping Use  . Vaping Use: Never used  Substance and Sexual Activity  . Alcohol use: Not Currently    Alcohol/week: 0.0 standard drinks    Comment: 03/10/2017 "I drink a wine cooler a few times/year"  . Drug use: No  . Sexual activity: Not Currently  Other Topics Concern  . Not on file  Social History Narrative  . Not on file   Social Determinants of Health   Financial Resource Strain:   . Difficulty of Paying Living Expenses: Not on file  Food Insecurity:   . Worried About Charity fundraiser in the Last Year: Not on file  . Ran Out of Food in the Last Year: Not on file  Transportation Needs:   . Lack of Transportation (Medical): Not on file   . Lack of Transportation (Non-Medical): Not on file  Physical Activity:   . Days of Exercise per Week: Not on file  . Minutes of Exercise per Session: Not on file  Stress:   . Feeling of Stress : Not on file  Social Connections:   . Frequency of Communication with Friends and Family: Not on file  . Frequency of Social Gatherings with Friends and Family: Not on file  . Attends Religious Services: Not on file  . Active Member of Clubs or Organizations: Not on file  . Attends Archivist Meetings: Not on file  . Marital Status: Not on file  Intimate Partner Violence:   . Fear of Current or Ex-Partner: Not on file  . Emotionally Abused: Not on file  . Physically Abused: Not on file  . Sexually Abused: Not on file    Review of Systems:  All other review of systems negative except as mentioned in the HPI.  Physical Exam: Vital signs in last 24 hours: Temp:  [98.2 F (36.8 C)] 98.2 F (36.8 C) (11/29 0827) Pulse Rate:  [75] 75 (11/29 0827) Resp:  [12] 12 (11/29 0827) BP: (135)/(86) 135/86 (11/29 0827) SpO2:  [98 %] 98 % (11/29 0827) Weight:  [60.8 kg] 60.8 kg (11/29 0827)   General:   Alert, in NAD Lungs:  Clear Heart:  Regular rate and rhythm Abdomen:  Soft, nontender and nondistended. Normal bowel sounds.   Neuro/Psych:  Alert and cooperative. Normal mood and affect. A and O x 3   K. Denzil Magnuson , MD [phone]

## 2020-05-14 NOTE — Discharge Instructions (Signed)
YOU HAD AN ENDOSCOPIC PROCEDURE TODAY: Refer to the procedure report and other information in the discharge instructions given to you for any specific questions about what was found during the examination. If this information does not answer your questions, please call [facility] office at [phone] to clarify.   YOU SHOULD EXPECT: Some feelings of bloating in the abdomen. Passage of more gas than usual. Walking can help get rid of the air that was put into your GI tract during the procedure and reduce the bloating. If you had a lower endoscopy (such as a colonoscopy or flexible sigmoidoscopy) you may notice spotting of blood in your stool or on the toilet paper. Some abdominal soreness may be present for a day or two, also.  DIET: Your first meal following the procedure should be a light meal and then it is ok to progress to your normal diet. A half-sandwich or bowl of soup is an example of a good first meal. Heavy or fried foods are harder to digest and may make you feel nauseous or bloated. Drink plenty of fluids but you should avoid alcoholic beverages for 24 hours. If you had a esophageal dilation, please see attached instructions for diet.    ACTIVITY: Your care partner should take you home directly after the procedure. You should plan to take it easy, moving slowly for the rest of the day. You can resume normal activity the day after the procedure however YOU SHOULD NOT DRIVE, use power tools, machinery or perform tasks that involve climbing or major physical exertion for 24 hours (because of the sedation medicines used during the test).   SYMPTOMS TO REPORT IMMEDIATELY: A gastroenterologist can be reached at any hour. Please call [phone]  for any of the following symptoms:   Following upper endoscopy (EGD, EUS, ERCP, esophageal dilation) Vomiting of blood or coffee ground material  New, significant abdominal pain  New, significant chest pain or pain under the shoulder blades  Painful or  persistently difficult swallowing  New shortness of breath  Black, tarry-looking or red, bloody stools  FOLLOW UP:  If any biopsies were taken you will be contacted by phone or by letter within the next 1-3 weeks. Call [phone]  if you have not heard about the biopsies in 3 weeks.  Please also call with any specific questions about appointments or follow up tests.  

## 2020-05-14 NOTE — Anesthesia Procedure Notes (Signed)
Performed by: Delcia Spitzley D, CRNA Oxygen Delivery Method: Nasal cannula

## 2020-05-14 NOTE — Anesthesia Postprocedure Evaluation (Signed)
Anesthesia Post Note  Patient: Brandy Houston  Procedure(s) Performed: ESOPHAGOGASTRODUODENOSCOPY (EGD) WITH PROPOFOL (N/A ) HOT HEMOSTASIS (ARGON PLASMA COAGULATION/BICAP) (N/A )     Patient location during evaluation: PACU Anesthesia Type: MAC Level of consciousness: awake and alert Pain management: pain level controlled Vital Signs Assessment: post-procedure vital signs reviewed and stable Respiratory status: spontaneous breathing, nonlabored ventilation, respiratory function stable and patient connected to nasal cannula oxygen Cardiovascular status: stable and blood pressure returned to baseline Postop Assessment: no apparent nausea or vomiting Anesthetic complications: no   No complications documented.  Last Vitals:  Vitals:   05/14/20 0920 05/14/20 0930  BP: (!) 127/40 (!) 153/61  Pulse:    Resp: 13 19  Temp:    SpO2: 95% 95%    Last Pain:  Vitals:   05/14/20 0930  TempSrc:   PainSc: 0-No pain                 Gustave Lindeman DAVID

## 2020-05-14 NOTE — Transfer of Care (Signed)
Immediate Anesthesia Transfer of Care Note  Patient: Brandy Houston  Procedure(s) Performed: ESOPHAGOGASTRODUODENOSCOPY (EGD) WITH PROPOFOL (N/A ) HOT HEMOSTASIS (ARGON PLASMA COAGULATION/BICAP) (N/A )  Patient Location: PACU  Anesthesia Type:MAC  Level of Consciousness: awake, alert  and oriented  Airway & Oxygen Therapy: Patient Spontanous Breathing and Patient connected to nasal cannula oxygen  Post-op Assessment: Report given to RN and Post -op Vital signs reviewed and stable  Post vital signs: Reviewed and stable  Last Vitals:  Vitals Value Taken Time  BP    Temp    Pulse    Resp    SpO2      Last Pain:  Vitals:   05/14/20 0827  TempSrc: Oral         Complications: No complications documented.

## 2020-05-14 NOTE — Op Note (Signed)
[facility] Patient Name: Brandy Houston Procedure Date: 05/14/2020 MRN: [id number] Attending MD: Mauri Pole , MD Date of Birth: 02-27-1956 CSN: [id number] Age: 64 Admit Type: Outpatient Procedure:                Upper GI endoscopy Indications:              Suspected upper gastrointestinal bleeding in                            patient with chronic blood loss, Treatment of                            bleeding arteriovenous malformation in the stomach Providers:                Mauri Pole, MD, Particia Nearing, RN, Benetta Spar, Technician, Ladona Ridgel, Technician Referring MD:              Medicines:                Monitored Anesthesia Care Complications:            No immediate complications. Estimated Blood Loss:     Estimated blood loss was minimal. Procedure:                Pre-Anesthesia Assessment:                           - Prior to the procedure, a History and Physical                            was performed, and patient medications and                            allergies were reviewed. [name] tolerance of                            previous anesthesia was also reviewed. The risks                            and benefits of the procedure and the sedation                            options and risks were discussed with the patient.                            All questions were answered, and informed consent                            was obtained. Prior Anticoagulants: The patient has                            taken no previous anticoagulant or antiplatelet  agents. ASA Grade Assessment: III - A patient with                            severe systemic disease. After reviewing the risks                            and benefits, the patient was deemed in                            satisfactory condition to undergo the procedure.                           After obtaining informed consent,  the endoscope was                            passed under direct vision. Throughout the                            procedure, the patient's blood pressure, pulse, and                            oxygen saturations were monitored continuously. The                            GIF-[id number] ([id number]) was introduced through the                            mouth, and advanced to the second part of duodenum.                            The upper GI endoscopy was accomplished without                            difficulty. The patient tolerated the procedure                            well. Scope In: Scope Out: Findings:      LA Grade B (one or more mucosal breaks greater than 5 mm, not extending       between the tops of two mucosal folds) esophagitis with no bleeding was       found 33 to 37 cm from the incisors.      Mild gastric antral vascular ectasia with contact bleeding was present       in the gastric fundus, in the gastric body, in the gastric antrum, in       the prepyloric region of the stomach and in the pylorus. Coagulation for       hemostasis using argon plasma was successful.      The examined duodenum was normal. Impression:               - LA Grade B candidiasis esophagitis with no                            bleeding.                           -  Gastric antral vascular ectasia with bleeding.                            Treated with argon plasma coagulation (APC).                           - Normal examined duodenum.                           - No specimens collected. Moderate Sedation:      N/A Recommendation:           - Patient has a contact number available for                            emergencies. The signs and symptoms of potential                            delayed complications were discussed with the                            patient. Return to normal activities tomorrow.                            Written discharge instructions were provided to the                             patient.                           - Resume previous diet.                           - Continue present medications.                           - Follow an antireflux regimen.                           - Use Protonix (pantoprazole) 20 mg PO BID.                           - Diflucan (fluconazole) 100 mg PO daily for 3 days. Procedure Code(s):        --- Professional ---                           [phone], Esophagogastroduodenoscopy, flexible,                            transoral; with control of bleeding, any method Diagnosis Code(s):        --- Professional ---                           B37.81, Candidal esophagitis                           K31.811, Angiodysplasia of stomach and duodenum  with bleeding                           R58, Hemorrhage, not elsewhere classified CPT copyright 2019 [employer]. All rights reserved. The codes documented in this report are preliminary and upon coder review may  be revised to meet current compliance requirements. Mauri Pole, MD 05/14/2020 9:21:59 AM This report has been signed electronically. Number of Addenda: 0

## 2020-05-14 NOTE — Anesthesia Preprocedure Evaluation (Addendum)
Anesthesia Evaluation  Patient identified by MRN, date of birth, ID band Patient awake    Reviewed: Allergy & Precautions, NPO status , Patient's Chart, lab work & pertinent test results  Airway Mallampati: I  TM Distance: >3 FB Neck ROM: Full    Dental   Pulmonary Current Smoker and Patient abstained from smoking.,    Pulmonary exam normal        Cardiovascular hypertension, Pt. on medications Normal cardiovascular exam  ECHO 11/21 IMPRESSIONS    1. Left ventricular ejection fraction, by estimation, is 40 to 45%. The  left ventricle has mildly decreased function. The left ventricle  demonstrates global hypokinesis. The left ventricular internal cavity size  was mildly dilated. There is mild left  ventricular hypertrophy. Left ventricular diastolic parameters are  consistent with Grade II diastolic dysfunction (pseudonormalization).  Elevated left atrial pressure.  2. Right ventricular systolic function is normal. The right ventricular  size is normal. There is severely elevated pulmonary artery systolic  pressure. The estimated right ventricular systolic pressure is 03.8 mmHg.  3. The mitral valve is normal in structure. Trivial mitral valve  regurgitation.  4. Tricuspid valve regurgitation is moderate.  5. Aortic valve regurgitation is mild to moderate. Moderate aortic valve  stenosis. Vmax 2.9 m/s, MG 18 mmHg, AVA 1.5 cm^2, DI 0.49  6. Aortic dilatation noted. There is mild dilatation of the ascending  aorta, measuring 36 mm.  7. The inferior vena cava is dilated in size with <50% respiratory  variability, suggesting right atrial pressure of 15 mmHg.  8. Moderate pericardial effusion measuring up to 1.3cm adjacent to right  atrium and 1.0 cm adjacent to LV lateral wall. No evidence of tamponade.  While IVC is fixed/dilated, no RV/RA collapse or significant mitral inflow  respiratory variation is seen.     Neuro/Psych Anxiety Depression    GI/Hepatic GERD  Medicated and Controlled,(+) Hepatitis -, C  Endo/Other  diabetes, Type 2, Oral Hypoglycemic Agents  Renal/GU Renal InsufficiencyRenal disease     Musculoskeletal   Abdominal   Peds  Hematology   Anesthesia Other Findings   Reproductive/Obstetrics                            Anesthesia Physical Anesthesia Plan  ASA: III  Anesthesia Plan: MAC   Post-op Pain Management:    Induction: Intravenous  PONV Risk Score and Plan: 1 and Treatment may vary due to age or medical condition  Airway Management Planned: Nasal Cannula  Additional Equipment:   Intra-op Plan:   Post-operative Plan:   Informed Consent: I have reviewed the patients History and Physical, chart, labs and discussed the procedure including the risks, benefits and alternatives for the proposed anesthesia with the patient or authorized representative who has indicated his/her understanding and acceptance.       Plan Discussed with: CRNA and Surgeon  Anesthesia Plan Comments:         Anesthesia Quick Evaluation

## 2020-05-16 ENCOUNTER — Encounter (HOSPITAL_COMMUNITY): Payer: Self-pay | Admitting: Gastroenterology

## 2020-05-16 DIAGNOSIS — Z992 Dependence on renal dialysis: Secondary | ICD-10-CM | POA: Diagnosis not present

## 2020-05-16 DIAGNOSIS — N186 End stage renal disease: Secondary | ICD-10-CM | POA: Diagnosis not present

## 2020-05-16 DIAGNOSIS — E1129 Type 2 diabetes mellitus with other diabetic kidney complication: Secondary | ICD-10-CM | POA: Diagnosis not present

## 2020-05-17 DIAGNOSIS — E1129 Type 2 diabetes mellitus with other diabetic kidney complication: Secondary | ICD-10-CM | POA: Diagnosis not present

## 2020-05-17 DIAGNOSIS — D509 Iron deficiency anemia, unspecified: Secondary | ICD-10-CM | POA: Diagnosis not present

## 2020-05-17 DIAGNOSIS — N186 End stage renal disease: Secondary | ICD-10-CM | POA: Diagnosis not present

## 2020-05-17 DIAGNOSIS — Z992 Dependence on renal dialysis: Secondary | ICD-10-CM | POA: Diagnosis not present

## 2020-05-17 DIAGNOSIS — D631 Anemia in chronic kidney disease: Secondary | ICD-10-CM | POA: Diagnosis not present

## 2020-05-17 DIAGNOSIS — N2581 Secondary hyperparathyroidism of renal origin: Secondary | ICD-10-CM | POA: Diagnosis not present

## 2020-05-18 ENCOUNTER — Telehealth: Payer: Self-pay | Admitting: Gastroenterology

## 2020-05-18 DIAGNOSIS — Z992 Dependence on renal dialysis: Secondary | ICD-10-CM | POA: Diagnosis not present

## 2020-05-18 DIAGNOSIS — N186 End stage renal disease: Secondary | ICD-10-CM | POA: Diagnosis not present

## 2020-05-18 DIAGNOSIS — I871 Compression of vein: Secondary | ICD-10-CM | POA: Diagnosis not present

## 2020-05-18 DIAGNOSIS — T82858A Stenosis of vascular prosthetic devices, implants and grafts, initial encounter: Secondary | ICD-10-CM | POA: Diagnosis not present

## 2020-05-19 DIAGNOSIS — N2581 Secondary hyperparathyroidism of renal origin: Secondary | ICD-10-CM | POA: Diagnosis not present

## 2020-05-19 DIAGNOSIS — N186 End stage renal disease: Secondary | ICD-10-CM | POA: Diagnosis not present

## 2020-05-19 DIAGNOSIS — D509 Iron deficiency anemia, unspecified: Secondary | ICD-10-CM | POA: Diagnosis not present

## 2020-05-19 DIAGNOSIS — D631 Anemia in chronic kidney disease: Secondary | ICD-10-CM | POA: Diagnosis not present

## 2020-05-19 DIAGNOSIS — E1129 Type 2 diabetes mellitus with other diabetic kidney complication: Secondary | ICD-10-CM | POA: Diagnosis not present

## 2020-05-19 DIAGNOSIS — Z992 Dependence on renal dialysis: Secondary | ICD-10-CM | POA: Diagnosis not present

## 2020-05-21 MED ORDER — PANTOPRAZOLE SODIUM 20 MG PO TBEC: 20.0000 mg | DELAYED_RELEASE_TABLET | Freq: Two times a day (BID) | ORAL | 11 refills | Status: DC

## 2020-05-21 NOTE — Telephone Encounter (Signed)
Called the patient. No answer. Left a message for the patient.Protonix 20 mg BID was transmitted to the pharmacy 05/14/20. Asked patient to call us back if she does not have that or it is not working.  Called the pharmacy [facility]). Could not get anyone to answer. Sent a new Rx in case they did not have the last Rx on file.

## 2020-05-22 ENCOUNTER — Telehealth: Payer: Self-pay | Admitting: Family Medicine

## 2020-05-22 DIAGNOSIS — N186 End stage renal disease: Secondary | ICD-10-CM | POA: Diagnosis not present

## 2020-05-22 DIAGNOSIS — E1129 Type 2 diabetes mellitus with other diabetic kidney complication: Secondary | ICD-10-CM | POA: Diagnosis not present

## 2020-05-22 DIAGNOSIS — D509 Iron deficiency anemia, unspecified: Secondary | ICD-10-CM | POA: Diagnosis not present

## 2020-05-22 DIAGNOSIS — D631 Anemia in chronic kidney disease: Secondary | ICD-10-CM | POA: Diagnosis not present

## 2020-05-22 DIAGNOSIS — N2581 Secondary hyperparathyroidism of renal origin: Secondary | ICD-10-CM | POA: Diagnosis not present

## 2020-05-22 DIAGNOSIS — Z992 Dependence on renal dialysis: Secondary | ICD-10-CM | POA: Diagnosis not present

## 2020-05-22 NOTE — Telephone Encounter (Signed)
Left message for patient to call back and schedule Medicare Annual Wellness Visit (AWV) either virtually OR in office.   Last AWV 04/22/19; please schedule at anytime with [facility] Health Advisor at [facility].  This should be a 45 minute visit.   

## 2020-05-24 DIAGNOSIS — Z992 Dependence on renal dialysis: Secondary | ICD-10-CM | POA: Diagnosis not present

## 2020-05-24 DIAGNOSIS — N2581 Secondary hyperparathyroidism of renal origin: Secondary | ICD-10-CM | POA: Diagnosis not present

## 2020-05-24 DIAGNOSIS — D631 Anemia in chronic kidney disease: Secondary | ICD-10-CM | POA: Diagnosis not present

## 2020-05-24 DIAGNOSIS — E1129 Type 2 diabetes mellitus with other diabetic kidney complication: Secondary | ICD-10-CM | POA: Diagnosis not present

## 2020-05-24 DIAGNOSIS — D509 Iron deficiency anemia, unspecified: Secondary | ICD-10-CM | POA: Diagnosis not present

## 2020-05-24 DIAGNOSIS — N186 End stage renal disease: Secondary | ICD-10-CM | POA: Diagnosis not present

## 2020-05-24 NOTE — Progress Notes (Signed)
Cardiology Office Note:    Date:  05/25/2020   ID:  Noga, Fogg 1955/10/27, MRN [id number]  PCP:  [name], MD  Cardiologist:  Donato Heinz, MD  Electrophysiologist:  None   Referring MD: [name], MD   Chief Complaint  Patient presents with  . Coronary Artery Disease   History of Present Illness:    Brandy Houston is a 64 y.o. female with a hx of multivessel CAD, HFrEF, ESRD on HD, type 2 diabetes, moderate aortic regurgitation, hypertension, anemia, septic arthritis s/p L AKA who presents for follow-up.  She was referred by Dr. Jimmy Footman for an evaluation of chest pain on 04/04/2019.  She presented to the ED on 11/27/2018 with chest pain.  Work-up was unremarkable but was notable for low hemoglobin (7.1).  Given her symptoms, Carlton Adam Myoview was done on 04/15/2019, which showed small fixed defect at apex, otherwise no abnormalities.  TTE on 04/13/2019 showed mild aortic stenosis, moderate aortic regurgitation, normal EF, grade 2 diastolic dysfunction, normal RV function.  Zio patch x7 days on 02/01/2020 showed one 4 beat run of NSVT, otherwise unremarkable.  Repeat echo on 03/28/2020 was done to follow-up her aortic regurgitation, which showed new LV systolic dysfunction (EF 30 to 35%, global hypokinesis), moderate LVH, grade 2 diastolic dysfunction, normal RV function, moderate RV enlargement, severe elevation in RVSP (74 mmHg), moderate pericardial effusion, moderate biatrial dilatation, mild to moderate mitral regurgitation, severe tricuspid regurgitation, moderate aortic regurgitation.  Cardiac catheterization on 04/13/2020 showed 99% proximal to mid LCx stenosis, 50% proximal RCA, 70% ostial to proximal LAD, 60% distal LCx, 90% ramus.  RHC showed elevated pressures (RA 15, RV 54/6, PA 59/24, PW 25, LVEDP 22).  She was evaluated by cardiac surgery and felt not to be a candidate for CABG.  She was also evaluated by interventional cardiology and recommended medical  management.  Repeat echocardiogram on 05/09/2020 showed LVEF 40 to 55%, grade 2 diastolic dysfunction, normal RV function, RVSP 77 mmHg, moderate TR, mild to moderate AI, moderate AS, moderate pericardial effusion with no evidence of tamponade.  Since last clinic visit, she underwent EGD with Dr. Silverio Decamp on 11/29, which showed GAVE with bleeding, treated with APC.  Also noted to have candidiasis esophagitis with no bleeding, treated with fluconazole.  She reports she has not had further bleeding since her procedure.  Does report that she has been having burning chest pain when she eats.  She started her fluconazole yesterday.  She continues to have shortness of breath.  Reports occasional lightheadedness, denies any syncope.  Occasional palpitations.     Past Medical History:  Diagnosis Date  . Allergy   . Anemia   . Anxiety   . Arthritis    "in my joints" (03/10/2017)  . Chronic diastolic CHF (congestive heart failure) ([place])   . CKD (chronic kidney disease), stage IV ([place])    stage IV. previous HD, none currently 10/30/15 (confirmed 02/05/2017 & 03/10/2017)  . Depression    Chronic  . Diabetic peripheral neuropathy ([place])   . Dysrhythmia    tachycardia, normal ECHO 08-09-14  . GERD (gastroesophageal reflux disease)   . Heart murmur     dx'd 02/05/2017  . Hepatitis C    "tx'd in 2016; I'm negative now" (02/05/2017)  . History of blood transfusion 2017   "w/knee replacement"  . Hyperlipidemia   . Hypertension   . Osteoarthritis of left knee   . Peripheral neuropathy   . Protein calorie malnutrition (  [place])   . Septic arthritis ([place])   . Slow transit constipation   . Symptomatic anemia 01/27/2020  . Type II diabetes mellitus ([name])    IDDM  . Uncontrolled hypertension 02/18/2013  . Unsteady gait     Past Surgical History:  Procedure Laterality Date  . A/V FISTULAGRAM Right 07/07/2018   Procedure: A/V FISTULAGRAM;  Surgeon: Marty Heck, MD;  Location: [facility];   Service: Cardiovascular;  Laterality: Right;  . AMPUTATION Left 03/20/2017   Procedure: LEFT ABOVE KNEE AMPUTATION;  Surgeon: Newt Minion, MD;  Location: [facility];  Service: Orthopedics;  Laterality: Left;  . APPENDECTOMY    . AV FISTULA PLACEMENT Left 09/13/2014   Procedure: Brachial Artery to Brachial Vein Gortex Four - Seven Stretch GRAFT INSERTION Left Forearm;  Surgeon: Mal Misty, MD;  Location: [facility];  Service: Vascular;  Laterality: Left;  . BASCILIC VEIN TRANSPOSITION Right 10/26/2017   Procedure: BASILIC VEIN TRANSPOSITION FIRST STAGE RIGHT ARM;  Surgeon: Conrad Plummer, MD;  Location: [facility];  Service: Vascular;  Laterality: Right;  . BASCILIC VEIN TRANSPOSITION Right 02/01/2018   Procedure: SECOND STAGE BASILIC VEIN TRANSPOSITION RIGHT UPPER EXTREMITY;  Surgeon: Marty Heck, MD;  Location: [facility];  Service: Vascular;  Laterality: Right;  . CHOLECYSTECTOMY OPEN    . COLON SURGERY    . COLONOSCOPY    . ESOPHAGOGASTRODUODENOSCOPY (EGD) WITH PROPOFOL N/A 05/14/2020   Procedure: ESOPHAGOGASTRODUODENOSCOPY (EGD) WITH PROPOFOL;  Surgeon: Mauri Pole, MD;  [facility];  Service: Endoscopy;  Laterality: N/A;  with APC  . EXCISIONAL TOTAL KNEE ARTHROPLASTY WITH ANTIBIOTIC SPACERS Left 02/06/2017   Procedure: Incisional total iknee with antibiotic spacer ;  Surgeon: Leandrew Koyanagi, MD;  Location: [facility];  Service: Orthopedics;  Laterality: Left;  . HOT HEMOSTASIS N/A 05/14/2020   Procedure: HOT HEMOSTASIS (ARGON PLASMA COAGULATION/BICAP);  Surgeon: Mauri Pole, MD;  Location: [facility];  Service: Endoscopy;  Laterality: N/A;  . INTRAVASCULAR PRESSURE WIRE/FFR STUDY N/A 04/13/2020   Procedure: INTRAVASCULAR PRESSURE WIRE/FFR STUDY;  Surgeon: Burnell Blanks, MD;  Location: [facility];  Service: Cardiovascular;  Laterality: N/A;  . IR FLUORO GUIDE CV LINE RIGHT  02/10/2017  . IR REMOVAL TUN CV CATH W/O FL  04/01/2017  . IR US GUIDE VASC ACCESS  RIGHT  02/10/2017  . IRRIGATION AND DEBRIDEMENT KNEE Left 03/12/2017   Procedure: IRRIGATION AND DEBRIDEMENT LEFT KNEE WITH WOUND VAC APPLICATION;  Surgeon: Leandrew Koyanagi, MD;  Location: [facility];  Service: Orthopedics;  Laterality: Left;  . JOINT REPLACEMENT    . KNEE ARTHROSCOPY Right 08/10/2014   Procedure: ARTHROSCOPY I & D KNEE;  Surgeon: Marianna Payment, MD;  [facility];  Service: Orthopedics;  Laterality: Right;  . KNEE ARTHROSCOPY Left 08/11/2014   Procedure: ARTHROSCOPIC WASHOUT LEFT KNEE;  Surgeon: Marianna Payment, MD;  Location: [facility];  Service: Orthopedics;  Laterality: Left;  . KNEE ARTHROSCOPY Left 08/19/2014   Procedure: ARTHROSCOPIC WASHOUT LEFT KNEE;  Surgeon: Leandrew Koyanagi, MD;  Location: [facility];  Service: Orthopedics;  Laterality: Left;  . KNEE ARTHROSCOPY WITH LATERAL MENISECTOMY Left 04/04/2015   Procedure: AND PARTIAL LATERAL MENISECTOMY;  Surgeon: Leandrew Koyanagi, MD;  Location: [facility];  Service: Orthopedics;  Laterality: Left;  . KNEE ARTHROSCOPY WITH MEDIAL MENISECTOMY Left 04/04/2015   Procedure: LEFT KNEE ARTHROSCOPY WITH PARTIAL MEDIAL MENISCECTOMY  AND SYNOVECTOMY;  Surgeon: Leandrew Koyanagi, MD;  Location: [facility];  Service: Orthopedics;  Laterality: Left;  . PERIPHERAL VASCULAR BALLOON ANGIOPLASTY  07/07/2018   Procedure: PERIPHERAL VASCULAR BALLOON ANGIOPLASTY;  Surgeon: Marty Heck, MD;  Location: [facility];  Service: Cardiovascular;;  right AV fistula  . POLYPECTOMY    . RIGHT/LEFT HEART CATH AND CORONARY ANGIOGRAPHY N/A 04/13/2020   Procedure: RIGHT/LEFT HEART CATH AND CORONARY ANGIOGRAPHY;  Surgeon: Burnell Blanks, MD;  Location: [facility];  Service: Cardiovascular;  Laterality: N/A;  . SHOULDER ARTHROSCOPY Bilateral 08/10/2014   Procedure: I & D BILATERAL SHOULDERS ;  Surgeon: Marianna Payment, MD;  [facility];  Service: Orthopedics;  Laterality: Bilateral;  . SMALL INTESTINE SURGERY      Due to Small Bowel Obstruction; "fixed it when they did my gallbladder OR"  . TEE WITHOUT CARDIOVERSION N/A 08/14/2014   Procedure: TRANSESOPHAGEAL ECHOCARDIOGRAM (TEE);  Surgeon: Thayer Headings, MD;  Location: [facility];  Service: Cardiovascular;  Laterality: N/A;  . TENOSYNOVECTOMY Right 08/11/2014   Procedure: RIGHT WRIST IRRIGATION AND DEBRIDEMENT, TENOSYNOVECTOMY;  Surgeon: Marianna Payment, MD;  Location: [facility];  Service: Orthopedics;  Laterality: Right;  . TOTAL KNEE ARTHROPLASTY Left 11/07/2015   Procedure: LEFT TOTAL KNEE ARTHROPLASTY WITH REVISION OF IMPLANTS;  Surgeon: Leandrew Koyanagi, MD;  Location: [facility];  Service: Orthopedics;  Laterality: Left;  . TUBAL LIGATION      Current Medications: Current Meds  Medication Sig  . Accu-Chek Softclix Lancets lancets Check sugars three times a day for E11.65  . acetaminophen (TYLENOL) 500 MG tablet Take 500-1,000 mg by mouth every 6 (six) hours as needed (for pain.).  [name] aspirin EC 81 MG tablet Take 81 mg by mouth daily.  [name] atorvastatin (LIPITOR) 10 MG tablet TAKE 1 TABLET BY MOUTH AT BEDTIME . APPOINTMENT REQUIRED FOR FUTURE REFILLS (Patient taking differently: Take 10 mg by mouth at bedtime.)  . blood glucose meter kit and supplies KIT Dispense based on patient and insurance preference. Use up to four times daily as directed  . Blood Glucose Monitoring Suppl (ACCU-CHEK GUIDE) w/Device KIT 1 Device by Does not apply route daily.  . calcium acetate (PHOSLO) 667 MG capsule Take 1,334-2,001 mg by mouth See admin instructions. Take 3 capsules (2001 mg) by mouth with breakfast, take 2 capsules (1334 mg) by mouth with lunch, take 3 capsules (2001 mg) by mouth with supper & take 2 capsules (1334 mg) by mouth with snacks.  . carvedilol (COREG) 3.125 MG tablet Take 1 tablet (3.125 mg total) by mouth 2 (two) times daily.  . cloNIDine (CATAPRES) 0.1 MG tablet Take 0.1 mg by mouth every 12 (twelve) hours.  . diphenhydrAMINE (BENADRYL) 25 mg capsule  Take 25 mg by mouth every 6 (six) hours as needed for allergies.  [name] gabapentin (NEURONTIN) 100 MG capsule Take 200 mg by mouth See admin instructions. Take 2 capsules (200 mg) by mouth 3 times on Sundays, Mondays, Wednesdays, & Fridays.  [name] gabapentin (NEURONTIN) 300 MG capsule Take 300 mg by mouth See admin instructions. Take 1 capsule (300 mg) by mouth twice daily on Tuesdays, Thursdays, & Saturdays.  [name] glucose blood (ACCU-CHEK GUIDE) test strip Check blood sugar up to 4 times a day  . hydrALAZINE (APRESOLINE) 100 MG tablet Take 100 mg by mouth in the morning and at bedtime.   . isosorbide mononitrate (IMDUR) 30 MG 24 hr tablet Take 1 tablet (30 mg total) by mouth daily.  [name] Devices (ACCU-CHEK SOFTCLIX) lancets Check sugars TID for E11.65  .  lidocaine-prilocaine (EMLA) cream Apply 1 application topically Every Tuesday,Thursday,and Saturday with dialysis. On days of dialysis 3x week  . methocarbamol (ROBAXIN) 500 MG tablet Take 500 mg by mouth every 8 (eight) hours as needed for muscle spasms.   . Methoxy PEG-Epoetin Beta (MIRCERA IJ) Get it at dialysis  . multivitamin (RENA-VIT) TABS tablet Take 1 tablet by mouth daily.  . nitroGLYCERIN (NITROSTAT) 0.4 MG SL tablet Place 1 tablet (0.4 mg total) under the tongue every 5 (five) minutes as needed.  . pantoprazole (PROTONIX) 20 MG tablet Take 1 tablet (20 mg total) by mouth 2 (two) times daily before a meal.  . polyethylene glycol (MIRALAX / GLYCOLAX) 17 g packet Take 17 g by mouth daily as needed for moderate constipation.  . sertraline (ZOLOFT) 100 MG tablet Take 1 tablet by mouth once daily (Patient taking differently: Take 100 mg by mouth every evening.)  . traZODone (DESYREL) 50 MG tablet Take 50 mg by mouth at bedtime as needed for sleep.      Allergies:   Compazine [prochlorperazine], Shellfish-derived products, Iodinated diagnostic agents, Omnipaque [iohexol], Tape, and Sulfa antibiotics   Social History   Socioeconomic History  .  Marital status: Single    Spouse name: Not on file  . Number of children: 3  . Years of education: Not on file  . Highest education level: Not on file  Occupational History  . Occupation: disabled  Tobacco Use  . Smoking status: Current Every Day Smoker    Packs/day: 0.50    Years: 35.00    Pack years: 17.50    Types: Cigarettes  . Smokeless tobacco: Never Used  Vaping Use  . Vaping Use: Never used  Substance and Sexual Activity  . Alcohol use: Not Currently    Alcohol/week: 0.0 standard drinks    Comment: 03/10/2017 "I drink a wine cooler a few times/year"  . Drug use: No  . Sexual activity: Not Currently  Other Topics Concern  . Not on file  Social History Narrative  . Not on file   Social Determinants of Health   Financial Resource Strain: Not on file  Food Insecurity: Not on file  Transportation Needs: Not on file  Physical Activity: Not on file  Stress: Not on file  Social Connections: Not on file     Family History: [name] family history includes Cancer in her brother; Diabetes in her brother; Heart disease in her father; Hypertension in her brother and sister; Uterine cancer in her mother and sister. There is no history of Colon cancer, Colon polyps, Esophageal cancer, Rectal cancer, or Stomach cancer.  ROS:   Please see the history of present illness.    All other systems reviewed and are negative.  EKGs/Labs/Other Studies Reviewed:    The following studies were reviewed today:   EKG:  EKG is ordered today.  The ekg ordered today demonstrates normal sinus rhythm, rate 82, Q waves in V1-2, low voltage, T wave inversion in V6  Recent Labs: 06/05/2019: Magnesium 2.7 05/02/2020: ALT 22; BUN 26; Creatinine, Ser 4.09; Hemoglobin 6.6; Platelets 209; Potassium 3.4; Sodium 142  Recent Lipid Panel    Component Value Date/Time   CHOL 122 04/16/2020 1132   TRIG 82 04/16/2020 1132   HDL 83 04/16/2020 1132   CHOLHDL 1.5 04/16/2020 1132   VLDL 16 04/16/2020  1132   LDLCALC 23 04/16/2020 [place] 03/3019:  Nuclear stress EF: 51%. No wall motion abnormalities. Low normal ejection fraction.  There  was no ST segment deviation noted during stress.  Defect 1: There is a small fixed defect of mild severity at the apex. No evidence of ischemia identified.  This is a low risk study. No ischemia identified.  TTE 04/13/19:   1. The AoV is tricuspid and moderately calcified. Mild aortic stenosis is  present with Vmax 2.9 mmHg and mean gradient 18 mmHg. Moderate aortic  regurgitation is present with PHT 352 msec. The aortic root is normal in  size. This appears to be  degenerative calcific AoV disease.  2. Left ventricular ejection fraction, by visual estimation, is 60 to  65%. The left ventricle has normal function. There is moderately increased  left ventricular hypertrophy.  3. Elevated left atrial pressure.  4. Left ventricular diastolic parameters are consistent with Grade II  diastolic dysfunction (pseudonormalization).  5. The left ventricle has no regional wall motion abnormalities.  6. Global right ventricle has normal systolic function.The right  ventricular size is normal. No increase in right ventricular wall  thickness.  7. Trivial pericardial effusion is present.  8. Mild calcification of the anterior mitral valve leaflet(s).  9. Mild mitral annular calcification.  10. Mild thickening of the anterior and posterior mitral valve leaflet(s).  11. The mitral valve is degenerative. Trace mitral valve regurgitation.  12. The tricuspid valve is grossly normal. Tricuspid valve regurgitation  is trivial.  13. Aortic valve regurgitation is moderate.  14. The aortic valve is tricuspid. Aortic valve regurgitation is moderate.  Mild aortic valve stenosis.  15. There is Moderate calcification of the aortic valve.  16. There is Moderate thickening of the aortic valve.  17. The pulmonic valve was grossly normal.  Pulmonic valve regurgitation is  trivial.  18. Mild plaque invoving the ascending aorta.  19. TR signal is inadequate for assessing pulmonary artery systolic  pressure.  20. The inferior vena cava is normal in size with greater than 50%  respiratory variability, suggesting right atrial pressure of 3 mmHg.   Physical Exam:    VS:  BP 110/66   Pulse 82   Ht '5\' 7"'  (1.702 m)   Wt 135 lb (61.2 kg)   LMP 03/16/2002   SpO2 94%   BMI 21.14 kg/m     Wt Readings from Last 3 Encounters:  05/25/20 135 lb (61.2 kg)  05/14/20 134 lb (60.8 kg)  04/25/20 135 lb (61.2 kg)     GEN: in no acute distress HEENT: Normal NECK: No JVD; No carotid bruits LYMPHATICS: No lymphadenopathy CARDIAC: RRR, 3/6 systolic murmur RESPIRATORY:  Clear to auscultation without rales, wheezing or rhonchi  ABDOMEN: Soft, non-tender, non-distended MUSCULOSKELETAL:  No edema. S/p L AKA SKIN: Warm and dry NEUROLOGIC:  Alert and oriented x 3 PSYCHIATRIC:  Normal affect   ASSESSMENT:    1. CAD in native artery    PLAN:    CAD:  Cardiac catheterization on 04/13/2020 showed 99% proximal to mid LCx stenosis, 50% proximal RCA, 70% ostial to proximal LAD, 60% distal LCx, 90% ramus.  RHC showed elevated pressures (RA 15, RV 54/6, PA 59/24, PW 25, LVEDP 22).  She was evaluated by cardiac surgery and felt not to be a candidate for CABG.  She was also evaluated by interventional cardiology and recommended medical management. -Patient requesting referral to interventional cardiology at [facility] for second opinion.  Will refer -Continue aspirin 81 mg daily, atorvastatin, Coreg 3.125 mg twice daily, and Imdur 30 mg daily.  PRN SL NTG  Chronic combined systolic and diastolic heart  failure:  Lexiscan Myoview was done for chest pain on 04/15/2019, which showed small fixed defect at apex, otherwise no abnormalities.  TTE on 04/13/2019 showed mild aortic stenosis, moderate aortic regurgitation, normal EF, grade 2 diastolic dysfunction,  normal RV function.Repeat echo on 03/28/2020 was done to follow-up her aortic regurgitation, which showed new LV systolic dysfunction (EF 30 to 35%, global hypokinesis), moderate LVH, grade 2 diastolic dysfunction, normal RV function, moderate RV enlargement, severe elevation in RVSP (74 mmHg), moderate pericardial effusion, moderate biatrial dilatation, mild to moderate mitral regurgitation, severe tricuspid regurgitation, moderate aortic regurgitation.  Cardiac catheterization on 04/13/2020 showed 99% proximal to mid LCx stenosis, 50% proximal RCA, 70% ostial to proximal LAD, 60% distal LCx, 90% ramus.  RHC showed elevated pressures (RA 15, RV 54/6, PA 59/24, PW 25, LVEDP 22).  Repeat echocardiogram on 05/09/2020 showed LVEF 40 to 41%, grade 2 diastolic dysfunction, normal RV function, RVSP 77 mmHg, moderate TR, mild to moderate AI, moderate AS, moderate pericardial effusion with no evidence of tamponade. -Continue carvedilol 3.125 mg twice daily -Continue hydralazine 100 mg 3 times daily and Imdur 30 mg daily -Volume removal with HD  Perciardial effusion: moderate effusion on echo 03/28/2020, repeat echocardiogram on 11/24 showed stable moderate effusion  Palpitations: Zio patch x7 days on 02/01/2020 showed one 4 beat run of NSVT, otherwise unremarkable.  Aortic regurgitation: Moderate on TTE in 03/2019.  Stable on repeat echo on 03/2020  Aortic stenosis: Moderate on echocardiogram 04/2020  Tricuspid regurgitation: Severe on echo 03/2020. Evaluated by cardiac surgery, not a candidate for intervention as above. Volume removal through HD.  Moderate TR on repeat echo 05/09/2020  Hypertension: On amlodipine 10 mg daily, hydralazine 100 mg 3 times daily on non-HD days, clonidine 0.1 mg twice daily, Coreg 3.125 mg twice daily, Imdur 30 mg daily.  Recommend weaning off clonidine to avoid use with beta-blocker, as will want to titrate up beta-blocker as tolerated.  Decrease clonidine to 0.1 mg daily x3 days  then stop.  Type 2 diabetes: On insulin.  Last A1c 6.3 on 02/28/2019  HLD: On atorvastatin 10 mg daily.  Last LDL 23 on 04/16/2020  ESRD: On HD  RTC in 2 months  Medication Adjustments/Labs and Tests Ordered: Current medicines are reviewed at length with the patient today.  Concerns regarding medicines are outlined above.  Orders Placed This Encounter  Procedures  . Ambulatory referral to Cardiology   No orders of the defined types were placed in this encounter.   Patient Instructions  Medication Instructions:  Call when you get home to let us know what your dose of clonidine is  *If you need a refill on your cardiac medications before your next appointment, please call your pharmacy*  Follow-Up: At [facility], you and your health needs are our priority.  As part of our continuing mission to provide you with exceptional heart care, we have created designated Provider Care Teams.  These Care Teams include your primary Cardiologist (physician) and Advanced Practice Providers (APPs -  Physician Assistants and Nurse Practitioners) who all work together to provide you with the care you need, when you need it.  We recommend signing up for the patient portal called "MyChart".  Sign up information is provided on this After Visit Summary.  MyChart is used to connect with patients for Virtual Visits (Telemedicine).  Patients are able to view lab/test results, encounter notes, upcoming appointments, etc.  Non-urgent messages can be sent to your provider as well.   To learn more  about what you can do with MyChart, go to [email or web address].    Your next appointment:   2 months with Dr. Gardiner Rhyme   Other Instructions You have been referred to :[facility]      Signed, Donato Heinz, MD  05/25/2020 9:59 PM    [place]

## 2020-05-25 ENCOUNTER — Ambulatory Visit (INDEPENDENT_AMBULATORY_CARE_PROVIDER_SITE_OTHER): Payer: Medicare Other | Admitting: Cardiology

## 2020-05-25 ENCOUNTER — Telehealth: Payer: Self-pay | Admitting: Cardiology

## 2020-05-25 ENCOUNTER — Other Ambulatory Visit: Payer: Self-pay

## 2020-05-25 ENCOUNTER — Encounter: Payer: Self-pay | Admitting: Cardiology

## 2020-05-25 VITALS — BP 110/66 | HR 82 | Ht 67.0 in | Wt 135.0 lb

## 2020-05-25 DIAGNOSIS — I5042 Chronic combined systolic (congestive) and diastolic (congestive) heart failure: Secondary | ICD-10-CM | POA: Diagnosis not present

## 2020-05-25 DIAGNOSIS — I351 Nonrheumatic aortic (valve) insufficiency: Secondary | ICD-10-CM

## 2020-05-25 DIAGNOSIS — I313 Pericardial effusion (noninflammatory): Secondary | ICD-10-CM | POA: Diagnosis not present

## 2020-05-25 DIAGNOSIS — I2510 Atherosclerotic heart disease of native coronary artery without angina pectoris: Secondary | ICD-10-CM | POA: Diagnosis not present

## 2020-05-25 DIAGNOSIS — I350 Nonrheumatic aortic (valve) stenosis: Secondary | ICD-10-CM | POA: Diagnosis not present

## 2020-05-25 DIAGNOSIS — I10 Essential (primary) hypertension: Secondary | ICD-10-CM

## 2020-05-25 DIAGNOSIS — I3139 Other pericardial effusion (noninflammatory): Secondary | ICD-10-CM

## 2020-05-25 NOTE — Telephone Encounter (Signed)
° ° ° °  Pt c/o medication issue:  1. Name of Medication: cloNIDine (CATAPRES) 0.1 MG tablet  2. How are you currently taking this medication (dosage and times per day)? Take 0.1 mg by mouth every 12 (twelve) hours.  3. Are you having a reaction (difficulty breathing--STAT)?   4. What is your medication issue? Pt would like for Dr. Gardiner Rhyme to know this is the medication she is currently taking. She wanted to know if she needs to discontinue taking this med

## 2020-05-25 NOTE — Telephone Encounter (Signed)
Routed to nurse/MD

## 2020-05-25 NOTE — Telephone Encounter (Signed)
Recommend weaning off clonidine, as will want to avoid use with beta-blockers, and will want more room to titrate up carvedilol.  Recommend decreasing clonidine to 0.1 mg once daily x3 days then stopping.  Check BP twice daily for next week and call with results.

## 2020-05-25 NOTE — Patient Instructions (Signed)
Medication Instructions:  Call when you get home to let us know what your dose of clonidine is  *If you need a refill on your cardiac medications before your next appointment, please call your pharmacy*  Follow-Up: At [facility], you and your health needs are our priority.  As part of our continuing mission to provide you with exceptional heart care, we have created designated Provider Care Teams.  These Care Teams include your primary Cardiologist (physician) and Advanced Practice Providers (APPs -  Physician Assistants and Nurse Practitioners) who all work together to provide you with the care you need, when you need it.  We recommend signing up for the patient portal called "MyChart".  Sign up information is provided on this After Visit Summary.  MyChart is used to connect with patients for Virtual Visits (Telemedicine).  Patients are able to view lab/test results, encounter notes, upcoming appointments, etc.  Non-urgent messages can be sent to your provider as well.   To learn more about what you can do with MyChart, go to [email or web address].    Your next appointment:   2 months with Dr. Gardiner Rhyme   Other Instructions You have been referred to :[facility]

## 2020-05-26 ENCOUNTER — Other Ambulatory Visit: Payer: Self-pay

## 2020-05-26 ENCOUNTER — Inpatient Hospital Stay (HOSPITAL_COMMUNITY)
Admission: EM | Admit: 2020-05-26 | Discharge: 2020-05-29 | DRG: 377 | Disposition: A | Discharge status: Home / Self Care | Payer: Medicare Other | Attending: Internal Medicine | Admitting: Internal Medicine

## 2020-05-26 DIAGNOSIS — B3781 Candidal esophagitis: Secondary | ICD-10-CM | POA: Diagnosis present

## 2020-05-26 DIAGNOSIS — K31811 Angiodysplasia of stomach and duodenum with bleeding: Secondary | ICD-10-CM | POA: Diagnosis not present

## 2020-05-26 DIAGNOSIS — M25451 Effusion, right hip: Secondary | ICD-10-CM

## 2020-05-26 DIAGNOSIS — Z7982 Long term (current) use of aspirin: Secondary | ICD-10-CM

## 2020-05-26 DIAGNOSIS — F419 Anxiety disorder, unspecified: Secondary | ICD-10-CM | POA: Diagnosis present

## 2020-05-26 DIAGNOSIS — D649 Anemia, unspecified: Secondary | ICD-10-CM | POA: Diagnosis not present

## 2020-05-26 DIAGNOSIS — Z992 Dependence on renal dialysis: Secondary | ICD-10-CM

## 2020-05-26 DIAGNOSIS — S7001XA Contusion of right hip, initial encounter: Secondary | ICD-10-CM | POA: Diagnosis present

## 2020-05-26 DIAGNOSIS — I5032 Chronic diastolic (congestive) heart failure: Secondary | ICD-10-CM | POA: Diagnosis present

## 2020-05-26 DIAGNOSIS — Z79899 Other long term (current) drug therapy: Secondary | ICD-10-CM

## 2020-05-26 DIAGNOSIS — Z8249 Family history of ischemic heart disease and other diseases of the circulatory system: Secondary | ICD-10-CM

## 2020-05-26 DIAGNOSIS — N189 Chronic kidney disease, unspecified: Secondary | ICD-10-CM | POA: Diagnosis not present

## 2020-05-26 DIAGNOSIS — W1830XA Fall on same level, unspecified, initial encounter: Secondary | ICD-10-CM | POA: Diagnosis present

## 2020-05-26 DIAGNOSIS — Z8049 Family history of malignant neoplasm of other genital organs: Secondary | ICD-10-CM

## 2020-05-26 DIAGNOSIS — F32A Depression, unspecified: Secondary | ICD-10-CM | POA: Diagnosis present

## 2020-05-26 DIAGNOSIS — Z89612 Acquired absence of left leg above knee: Secondary | ICD-10-CM

## 2020-05-26 DIAGNOSIS — D62 Acute posthemorrhagic anemia: Secondary | ICD-10-CM | POA: Diagnosis present

## 2020-05-26 DIAGNOSIS — K2100 Gastro-esophageal reflux disease with esophagitis, without bleeding: Secondary | ICD-10-CM | POA: Diagnosis present

## 2020-05-26 DIAGNOSIS — D509 Iron deficiency anemia, unspecified: Secondary | ICD-10-CM | POA: Diagnosis not present

## 2020-05-26 DIAGNOSIS — N186 End stage renal disease: Secondary | ICD-10-CM | POA: Diagnosis not present

## 2020-05-26 DIAGNOSIS — Z91041 Radiographic dye allergy status: Secondary | ICD-10-CM

## 2020-05-26 DIAGNOSIS — I10 Essential (primary) hypertension: Secondary | ICD-10-CM | POA: Diagnosis present

## 2020-05-26 DIAGNOSIS — E876 Hypokalemia: Secondary | ICD-10-CM | POA: Diagnosis present

## 2020-05-26 DIAGNOSIS — D631 Anemia in chronic kidney disease: Secondary | ICD-10-CM | POA: Diagnosis present

## 2020-05-26 DIAGNOSIS — K31819 Angiodysplasia of stomach and duodenum without bleeding: Secondary | ICD-10-CM

## 2020-05-26 DIAGNOSIS — M1990 Unspecified osteoarthritis, unspecified site: Secondary | ICD-10-CM | POA: Diagnosis present

## 2020-05-26 DIAGNOSIS — K5521 Angiodysplasia of colon with hemorrhage: Secondary | ICD-10-CM

## 2020-05-26 DIAGNOSIS — Z20822 Contact with and (suspected) exposure to covid-19: Secondary | ICD-10-CM | POA: Diagnosis present

## 2020-05-26 DIAGNOSIS — N2581 Secondary hyperparathyroidism of renal origin: Secondary | ICD-10-CM | POA: Diagnosis not present

## 2020-05-26 DIAGNOSIS — E785 Hyperlipidemia, unspecified: Secondary | ICD-10-CM | POA: Diagnosis present

## 2020-05-26 DIAGNOSIS — E1129 Type 2 diabetes mellitus with other diabetic kidney complication: Secondary | ICD-10-CM | POA: Diagnosis not present

## 2020-05-26 DIAGNOSIS — Z833 Family history of diabetes mellitus: Secondary | ICD-10-CM

## 2020-05-26 DIAGNOSIS — E1122 Type 2 diabetes mellitus with diabetic chronic kidney disease: Secondary | ICD-10-CM | POA: Diagnosis present

## 2020-05-26 DIAGNOSIS — F17210 Nicotine dependence, cigarettes, uncomplicated: Secondary | ICD-10-CM | POA: Diagnosis present

## 2020-05-26 DIAGNOSIS — I132 Hypertensive heart and chronic kidney disease with heart failure and with stage 5 chronic kidney disease, or end stage renal disease: Secondary | ICD-10-CM | POA: Diagnosis present

## 2020-05-26 LAB — CBC
HCT: 19.5 % — ABNORMAL LOW (ref 36.0–46.0)
Hemoglobin: 6.1 g/dL — CL (ref 12.0–15.0)
MCH: 30.7 pg (ref 26.0–34.0)
MCHC: 31.3 g/dL (ref 30.0–36.0)
MCV: 98 fL (ref 80.0–100.0)
Platelets: 206 10*3/uL (ref 150–400)
RBC: 1.99 MIL/uL — ABNORMAL LOW (ref 3.87–5.11)
RDW: 18.6 % — ABNORMAL HIGH (ref 11.5–15.5)
WBC: 5.4 10*3/uL (ref 4.0–10.5)
nRBC: 0.4 % — ABNORMAL HIGH (ref 0.0–0.2)

## 2020-05-26 LAB — COMPREHENSIVE METABOLIC PANEL
ALT: 19 U/L (ref 0–44)
AST: 12 U/L — ABNORMAL LOW (ref 15–41)
Albumin: 2.9 g/dL — ABNORMAL LOW (ref 3.5–5.0)
Alkaline Phosphatase: 69 U/L (ref 38–126)
Anion gap: 10 (ref 5–15)
BUN: 13 mg/dL (ref 8–23)
CO2: 34 mmol/L — ABNORMAL HIGH (ref 22–32)
Calcium: 7.9 mg/dL — ABNORMAL LOW (ref 8.9–10.3)
Chloride: 93 mmol/L — ABNORMAL LOW (ref 98–111)
Creatinine, Ser: 2.19 mg/dL — ABNORMAL HIGH (ref 0.44–1.00)
GFR, Estimated: 24 mL/min — ABNORMAL LOW (ref >60)
Glucose, Bld: 130 mg/dL — ABNORMAL HIGH (ref 70–99)
Potassium: 3.1 mmol/L — ABNORMAL LOW (ref 3.5–5.1)
Sodium: 137 mmol/L (ref 135–145)
Total Bilirubin: 0.6 mg/dL (ref 0.3–1.2)
Total Protein: 6 g/dL — ABNORMAL LOW (ref 6.5–8.1)

## 2020-05-26 LAB — PREPARE RBC (CROSSMATCH)

## 2020-05-26 MED ORDER — SODIUM CHLORIDE 0.9% IV SOLUTION: Freq: Once | INTRAVENOUS | Status: DC

## 2020-05-26 NOTE — ED Triage Notes (Signed)
Pt sent by [facility] for blood transfusion. Hgb 6.0 on Thursday.

## 2020-05-26 NOTE — ED Provider Notes (Signed)
[facility] EMERGENCY DEPARTMENT Provider Note   CSN: [id number] Arrival date & time: 05/26/20  1607     History Chief Complaint  Patient presents with  . Abnormal Lab    Brandy Houston is a 64 y.o. female with a history of ESRD on HD via right AVF, CHF, and diabetes presents from dialysis due to concern for anemia.  Patient states that she had a full session of dialysis today but was sent to the ED for low hemoglobin.  Associated with some lightheadedness while standing this morning although states that this is since resolved.  No other symptoms.  She denies any hematemesis, hematochezia, melena, or other sources of known bleeding.  However, patient does endorse a fall about a week ago and has a swelling on her right leg.  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  She denies any head trauma or LOC.  The history is provided by the patient.       Past Medical History:  Diagnosis Date  . Allergy   . Anemia   . Anxiety   . Arthritis    "in my joints" (03/10/2017)  . Chronic diastolic CHF (congestive heart failure) ([place])   . CKD (chronic kidney disease), stage IV ([place])    stage IV. previous HD, none currently 10/30/15 (confirmed 02/05/2017 & 03/10/2017)  . Depression    Chronic  . Diabetic peripheral neuropathy ([place])   . Dysrhythmia    tachycardia, normal ECHO 08-09-14  . GERD (gastroesophageal reflux disease)   . Heart murmur     dx'd 02/05/2017  . Hepatitis C    "tx'd in 2016; I'm negative now" (02/05/2017)  . History of blood transfusion 2017   "w/knee replacement"  . Hyperlipidemia   . Hypertension   . Osteoarthritis of left knee   . Peripheral neuropathy   . Protein calorie malnutrition ([place])   . Septic arthritis ([place])   . Slow transit constipation   . Symptomatic anemia 01/27/2020  . Type II diabetes mellitus ([name])    IDDM  . Uncontrolled hypertension 02/18/2013  . Unsteady gait      Patient Active Problem List   Diagnosis Date Noted  . Hip swelling, right 05/27/2020  . Hypokalemia 05/27/2020  . Anemia of chronic renal failure 05/27/2020  . 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 Acute combined systolic and diastolic heart failure ([place])   . Unstable angina ([place])   . Symptomatic anemia 01/27/2020  . Hypercalcemia 10/06/2019  . QT prolongation 06/05/2019  . Moderate protein-calorie malnutrition ([place]) 05/11/2018  . End stage renal disease ([place]) 04/19/2018  . Iron deficiency anemia, unspecified 04/19/2018  . Nephrotic syndrome with unspecified morphologic changes 04/19/2018  . Secondary hyperparathyroidism of renal origin ([place]) 04/19/2018  . Tobacco use 04/19/2018  . Benign essential HTN   . Above knee amputation status, left   . Anemia, blood loss   . DM type 2 causing ESRD ([place]) 03/21/2017  . Anemia in chronic kidney disease 02/05/2017  . Chronic diastolic CHF (congestive heart failure) ([place]) 11/08/2015  . Diabetic peripheral neuropathy ([place])   . Major depression, recurrent, chronic ([place])   . Chronic kidney disease (CKD), stage IV (severe) ([place])   . Hepatitis C 08/16/2014  . Peripheral neuropathy 06/24/2013    Past Surgical History:  Procedure Laterality Date  . A/V FISTULAGRAM Right 07/07/2018   Procedure: A/V FISTULAGRAM;  Surgeon: Marty Heck,  MD;  Location: [facility];  Service: Cardiovascular;  Laterality: Right;  . AMPUTATION Left 03/20/2017   Procedure: LEFT ABOVE KNEE AMPUTATION;  Surgeon: Newt Minion, MD;  Location: [facility];  Service: Orthopedics;  Laterality: Left;  . APPENDECTOMY    . AV FISTULA PLACEMENT Left 09/13/2014   Procedure: Brachial Artery to Brachial Vein Gortex Four - Seven Stretch GRAFT INSERTION Left Forearm;  Surgeon: Mal Misty, MD;  Location: [facility];  Service: Vascular;  Laterality: Left;  . BASCILIC VEIN TRANSPOSITION Right 10/26/2017   Procedure: BASILIC VEIN TRANSPOSITION  FIRST STAGE RIGHT ARM;  Surgeon: Conrad Deer Grove, MD;  Location: [facility];  Service: Vascular;  Laterality: Right;  . BASCILIC VEIN TRANSPOSITION Right 02/01/2018   Procedure: SECOND STAGE BASILIC VEIN TRANSPOSITION RIGHT UPPER EXTREMITY;  Surgeon: Marty Heck, MD;  Location: [facility];  Service: Vascular;  Laterality: Right;  . CHOLECYSTECTOMY OPEN    . COLON SURGERY    . COLONOSCOPY    . ESOPHAGOGASTRODUODENOSCOPY (EGD) WITH PROPOFOL N/A 05/14/2020   Procedure: ESOPHAGOGASTRODUODENOSCOPY (EGD) WITH PROPOFOL;  Surgeon: Mauri Pole, MD;  [facility]: [facility];  Service: Endoscopy;  Laterality: N/A;  with APC  . EXCISIONAL TOTAL KNEE ARTHROPLASTY WITH ANTIBIOTIC SPACERS Left 02/06/2017   Procedure: Incisional total iknee with antibiotic spacer ;  Surgeon: Leandrew Koyanagi, MD;  Location: [facility];  Service: Orthopedics;  Laterality: Left;  . HOT HEMOSTASIS N/A 05/14/2020   Procedure: HOT HEMOSTASIS (ARGON PLASMA COAGULATION/BICAP);  Surgeon: Mauri Pole, MD;  Location: [facility];  Service: Endoscopy;  Laterality: N/A;  . INTRAVASCULAR PRESSURE WIRE/FFR STUDY N/A 04/13/2020   Procedure: INTRAVASCULAR PRESSURE WIRE/FFR STUDY;  Surgeon: Burnell Blanks, MD;  Location: [facility];  Service: Cardiovascular;  Laterality: N/A;  . IR FLUORO GUIDE CV LINE RIGHT  02/10/2017  . IR REMOVAL TUN CV CATH W/O FL  04/01/2017  . IR US GUIDE VASC ACCESS RIGHT  02/10/2017  . IRRIGATION AND DEBRIDEMENT KNEE Left 03/12/2017   Procedure: IRRIGATION AND DEBRIDEMENT LEFT KNEE WITH WOUND VAC APPLICATION;  Surgeon: Leandrew Koyanagi, MD;  Location: [facility];  Service: Orthopedics;  Laterality: Left;  . JOINT REPLACEMENT    . KNEE ARTHROSCOPY Right 08/10/2014   Procedure: ARTHROSCOPY I & D KNEE;  Surgeon: Marianna Payment, MD;  [facility];  Service: Orthopedics;  Laterality: Right;  . KNEE ARTHROSCOPY Left 08/11/2014   Procedure: ARTHROSCOPIC WASHOUT LEFT KNEE;  Surgeon: Marianna Payment, MD;   Location: [facility];  Service: Orthopedics;  Laterality: Left;  . KNEE ARTHROSCOPY Left 08/19/2014   Procedure: ARTHROSCOPIC WASHOUT LEFT KNEE;  Surgeon: Leandrew Koyanagi, MD;  Location: [facility];  Service: Orthopedics;  Laterality: Left;  . KNEE ARTHROSCOPY WITH LATERAL MENISECTOMY Left 04/04/2015   Procedure: AND PARTIAL LATERAL MENISECTOMY;  Surgeon: Leandrew Koyanagi, MD;  Location: [facility];  Service: Orthopedics;  Laterality: Left;  . KNEE ARTHROSCOPY WITH MEDIAL MENISECTOMY Left 04/04/2015   Procedure: LEFT KNEE ARTHROSCOPY WITH PARTIAL MEDIAL MENISCECTOMY  AND SYNOVECTOMY;  Surgeon: Leandrew Koyanagi, MD;  Location: [facility];  Service: Orthopedics;  Laterality: Left;  . PERIPHERAL VASCULAR BALLOON ANGIOPLASTY  07/07/2018   Procedure: PERIPHERAL VASCULAR BALLOON ANGIOPLASTY;  Surgeon: Marty Heck, MD;  Location: [facility];  Service: Cardiovascular;;  right AV fistula  . POLYPECTOMY    . RIGHT/LEFT HEART CATH AND CORONARY ANGIOGRAPHY N/A 04/13/2020   Procedure: RIGHT/LEFT HEART CATH AND CORONARY ANGIOGRAPHY;  Surgeon: Burnell Blanks, MD;  Location: [facility];  Service: Cardiovascular;  Laterality: N/A;  . SHOULDER ARTHROSCOPY Bilateral 08/10/2014   Procedure: I & D BILATERAL SHOULDERS ;  Surgeon: Marianna Payment, MD;  [facility];  Service: Orthopedics;  Laterality: Bilateral;  . SMALL INTESTINE SURGERY     Due to Small Bowel Obstruction; "fixed it when they did my gallbladder OR"  . TEE WITHOUT CARDIOVERSION N/A 08/14/2014   Procedure: TRANSESOPHAGEAL ECHOCARDIOGRAM (TEE);  Surgeon: Thayer Headings, MD;  Location: [facility];  Service: Cardiovascular;  Laterality: N/A;  . TENOSYNOVECTOMY Right 08/11/2014   Procedure: RIGHT WRIST IRRIGATION AND DEBRIDEMENT, TENOSYNOVECTOMY;  Surgeon: Marianna Payment, MD;  Location: [name];  Service: Orthopedics;  Laterality: Right;  . TOTAL KNEE ARTHROPLASTY Left 11/07/2015   Procedure: LEFT TOTAL KNEE  ARTHROPLASTY WITH REVISION OF IMPLANTS;  Surgeon: Leandrew Koyanagi, MD;  Location: [facility];  Service: Orthopedics;  Laterality: Left;  . TUBAL LIGATION       OB History    Gravida  5   Para  0   Term  0   Preterm      AB  2   Living  3     SAB  1   IAB  1   Ectopic      Multiple      Live Births              Family History  Problem Relation Age of Onset  . Uterine cancer Mother   . Heart disease Father   . Hypertension Sister   . Uterine cancer Sister   . Cancer Brother        unsure type  . Diabetes Brother   . Hypertension Brother   . Colon cancer Neg Hx   . Colon polyps Neg Hx   . Esophageal cancer Neg Hx   . Rectal cancer Neg Hx   . Stomach cancer Neg Hx     Social History   Tobacco Use  . Smoking status: Current Every Day Smoker    Packs/day: 0.50    Years: 35.00    Pack years: 17.50    Types: Cigarettes  . Smokeless tobacco: Never Used  Vaping Use  . Vaping Use: Never used  Substance Use Topics  . Alcohol use: Not Currently    Alcohol/week: 0.0 standard drinks    Comment: 03/10/2017 "I drink a wine cooler a few times/year"  . Drug use: No    Home Medications Prior to Admission medications   Medication Sig Start Date End Date Taking? Authorizing Provider  Accu-Chek Softclix Lancets lancets Check sugars three times a day for E11.65 09/20/18  Yes [name], MD  acetaminophen (TYLENOL) 500 MG tablet Take 500-1,000 mg by mouth every 6 (six) hours as needed (for pain.).   Yes [provider]  aspirin EC 81 MG tablet Take 81 mg by mouth daily.   Yes [provider]  atorvastatin (LIPITOR) 10 MG tablet TAKE 1 TABLET BY MOUTH AT BEDTIME . APPOINTMENT REQUIRED FOR FUTURE REFILLS Patient taking differently: Take 10 mg by mouth at bedtime. 10/31/19  Yes [name], MD  blood glucose meter kit and supplies KIT Dispense based on patient and insurance preference. Use up to four times daily as directed 10/13/19  Yes [name],  MD  Blood Glucose Monitoring Suppl (ACCU-CHEK GUIDE) w/Device KIT 1 Device by Does not apply route daily. 10/31/19  Yes [name], MD  calcium acetate (PHOSLO) 667 MG capsule Take 1,334-2,001 mg by mouth See admin instructions. Take 3 capsules (2001 mg) by mouth with breakfast, take 2 capsules (1334 mg) by mouth with lunch, take 3 capsules (2001 mg) by mouth with supper & take 2 capsules (1334 mg) by mouth with snacks. 01/13/19  Yes [provider]  carvedilol (COREG) 3.125 MG tablet Take 1 tablet (3.125 mg total) by mouth 2 (two) times daily. 04/04/20 07/03/20 Yes Donato Heinz, MD  cloNIDine (CATAPRES) 0.1 MG tablet Take 0.1 mg by mouth See admin instructions. Bid on non-dialysis days( m,w,f,sun) 03/29/20  Yes [provider]  diphenhydrAMINE (BENADRYL) 25 mg capsule Take 25 mg by mouth every 6 (six) hours as needed for allergies.   Yes [provider]  gabapentin (NEURONTIN) 100 MG capsule Take 200 mg by mouth See admin instructions. Take 2 capsules (200 mg) by mouth 3 times on Sundays, Mondays, Wednesdays, & Fridays. 11/01/18  Yes [provider]  gabapentin (NEURONTIN) 300 MG capsule Take 300 mg by mouth See admin instructions. Take 1 capsule (300 mg) by mouth twice daily on Tuesdays, Thursdays, & Saturdays.   Yes [provider]  glucose blood (ACCU-CHEK GUIDE) test strip Check blood sugar up to 4 times a day 10/20/19  Yes [name], MD  hydrALAZINE (APRESOLINE) 100 MG tablet Take 100 mg by mouth See admin instructions. Bid on non-dialysis days.( m,w,f,sun)   Yes [provider]  isosorbide mononitrate (IMDUR) 30 MG 24 hr tablet Take 1 tablet (30 mg total) by mouth daily. 04/16/20 07/15/20 Yes Cheryln Manly, NP  Lancet Devices [facility]) lancets Check sugars TID for E11.65 01/29/15  Yes Chari Manning A, NP  lidocaine-prilocaine (EMLA) cream Apply 1 application topically Every Tuesday,Thursday,and Saturday with  dialysis. On days of dialysis 3x week 10/25/18  Yes [provider]  methocarbamol (ROBAXIN) 500 MG tablet Take 500 mg by mouth every 8 (eight) hours as needed for muscle spasms.  06/08/19  Yes [provider]  Methoxy PEG-Epoetin Beta (MIRCERA IJ) Get it at dialysis 06/30/19 06/28/20 Yes [provider]  multivitamin (RENA-VIT) TABS tablet Take 1 tablet by mouth daily. 03/01/19  Yes [provider]  nitroGLYCERIN (NITROSTAT) 0.4 MG SL tablet Place 1 tablet (0.4 mg total) under the tongue every 5 (five) minutes as needed. 04/25/20 07/24/20 Yes Donato Heinz, MD  pantoprazole (PROTONIX) 20 MG tablet Take 1 tablet (20 mg total) by mouth 2 (two) times daily before a meal. 05/21/20  Yes Nandigam, Venia Minks, MD  polyethylene glycol (MIRALAX / GLYCOLAX) 17 g packet Take 17 g by mouth daily as needed for moderate constipation. 12/10/18  Yes [name], MD  sertraline (ZOLOFT) 100 MG tablet Take 1 tablet by mouth once daily Patient taking differently: Take 100 mg by mouth every evening. 03/26/20  Yes [name], MD  traZODone (DESYREL) 50 MG tablet Take 50 mg by mouth at bedtime as needed for sleep.  10/27/18  Yes [provider]    Allergies    Compazine [prochlorperazine], Shellfish-derived products, Iodinated diagnostic agents, Omnipaque [iohexol], Tape, and Sulfa antibiotics  Review of Systems   Review of Systems  Constitutional: Negative for chills and fever.  HENT: Negative for ear pain and sore throat.   Eyes: Negative for pain and visual disturbance.  Respiratory: Negative for cough and shortness of breath.   Cardiovascular: Negative for chest pain and palpitations.  Gastrointestinal: Negative for abdominal pain, anal bleeding, blood in stool, diarrhea,  nausea and vomiting.  Genitourinary: Negative for dysuria and hematuria.  Musculoskeletal: Negative for arthralgias and back pain.  Skin: Negative for color change and rash.   Neurological: Positive for light-headedness. Negative for seizures and syncope.  All other systems reviewed and are negative.   Physical Exam Updated Vital Signs BP 135/69   Pulse 80   Temp 98.5 F (36.9 C) (Oral)   Resp 16   Ht _0  (1.702 m)   Wt 59 kg   LMP 03/16/2002   SpO2 95%   BMI 20.36 kg/m   Physical Exam Vitals and nursing note reviewed. Exam conducted with a chaperone present (rectal exam chaperoned by RN).  Constitutional:      General: She is not in acute distress.    Appearance: She is well-developed and well-nourished. She is not ill-appearing or toxic-appearing.  HENT:     Head: Normocephalic and atraumatic.  Eyes:     Conjunctiva/sclera: Conjunctivae normal.  Cardiovascular:     Rate and Rhythm: Normal rate and regular rhythm.     Heart sounds: No murmur heard.   Pulmonary:     Effort: Pulmonary effort is normal. No respiratory distress.     Breath sounds: Normal breath sounds.  Abdominal:     General: There is no distension.     Palpations: Abdomen is soft.     Tenderness: There is no abdominal tenderness.  Genitourinary:    Comments: Dark stool.  No bright red bleeding. Musculoskeletal:        General: Swelling (Large swelling over right lateral hip with central fluctuance but has induration of large area of surrounding skin extending to right buttocks and distal thigh) and tenderness present. No edema.     Cervical back: Neck supple.     Comments: Left AKA, stump is well healed.  2+ right DP pulse, normal sensation, normal strength in right lower extremity.  Skin:    General: Skin is warm and dry.  Neurological:     Mental Status: She is alert and oriented to person, place, and time. Mental status is at baseline.     Cranial Nerves: No cranial nerve deficit.     Sensory: No sensory deficit.     Motor: No weakness.  Psychiatric:        Mood and Affect: Mood and affect normal.     ED Results / Procedures / Treatments   Labs (all labs  ordered are listed, but only abnormal results are displayed) Labs Reviewed  COMPREHENSIVE METABOLIC PANEL - Abnormal; Notable for the following components:      Result Value   Potassium 3.1 (*)    Chloride 93 (*)    CO2 34 (*)    Glucose, Bld 130 (*)    Creatinine, Ser 2.19 (*)    Calcium 7.9 (*)    Total Protein 6.0 (*)    Albumin 2.9 (*)    AST 12 (*)    GFR, Estimated 24 (*)    All other components within normal limits  CBC - Abnormal; Notable for the following components:   RBC 1.99 (*)    Hemoglobin 6.1 (*)    HCT 19.5 (*)    RDW 18.6 (*)    nRBC 0.4 (*)    All other components within normal limits  POC OCCULT BLOOD, ED - Abnormal; Notable for the following components:   Fecal Occult Bld POSITIVE (*)    All other components within normal limits  BASIC METABOLIC PANEL  CBC  TYPE AND  SCREEN  PREPARE RBC (CROSSMATCH)    EKG None  Radiology No results found.  Procedures Procedures (including critical care time)  Medications Ordered in ED Medications  0.9 %  sodium chloride infusion (Manually program via Guardrails IV Fluids) (has no administration in time range)  atorvastatin (LIPITOR) tablet 10 mg (has no administration in time range)  carvedilol (COREG) tablet 3.125 mg (has no administration in time range)  hydrALAZINE (APRESOLINE) tablet 100 mg (has no administration in time range)  isosorbide mononitrate (IMDUR) 24 hr tablet 30 mg (has no administration in time range)  sertraline (ZOLOFT) tablet 100 mg (has no administration in time range)  traZODone (DESYREL) tablet 50 mg (has no administration in time range)  pantoprazole (PROTONIX) EC tablet 20 mg (has no administration in time range)  calcium acetate (PHOSLO) capsule 1,334-2,001 mg (has no administration in time range)  acetaminophen (TYLENOL) tablet 650 mg (has no administration in time range)    Or  acetaminophen (TYLENOL) suppository 650 mg (has no administration in time range)  polyethylene glycol  (MIRALAX / GLYCOLAX) packet 17 g (has no administration in time range)  nicotine (NICODERM CQ - dosed in mg/24 hr) patch 7 mg (has no administration in time range)  cloNIDine (CATAPRES) tablet 0.1 mg (has no administration in time range)  gabapentin (NEURONTIN) capsule 200 mg (has no administration in time range)  gabapentin (NEURONTIN) capsule 300 mg (has no administration in time range)    ED Course  I have reviewed the triage vital signs and the nursing notes.  Pertinent labs & imaging results that were available during my care of the patient were reviewed by me and considered in my medical decision making (see chart for details).  Clinical Course as of 05/27/20 0131  Sun May 27, 2020  0003 Fecal Occult Blood, POC(!): POSITIVE Is taking PO iron supplements [CH]  0010 Potassium(!): 3.1 [CH]  0010 Hemoglobin(!!): 6.1 [CH]  0046 Handoff given to Dr. Tonie Griffith. MRI hip requested per Dr. Tonie Griffith [CH]    Clinical Course User Index [CH] Darrick Huntsman, MD   MDM Rules/Calculators/A&P                          MDM: Valkyrie Guardiola is a 64 y.o. female who presents with anemia from dialysis center as per above. I have reviewed the nursing documentation for past medical history, family history, and social history. Pertinent previous records reviewed. She is awake, alert. HDS. Afebrile. Physical exam is most notable for swelling of her right hip.  Neurovascular intact in distal right leg.  Labs: Hemoglobin 6.1, potassium 3.1, fecal occult positive. Imaging: Hip imaging pending at time of admission.  CT with contrast not obtained due to contrast allergy. Consults: Hospitalist for admission Tx: Transfused 1 unit PRBC  Differential Dx: I am most concerned for anemia due to occult GI bleeding versus active or passive extravasation into right hip hematoma.  If she has a GI bleed, it is likely chronic or subacute rather than acute.  Her right hip swelling is also concerning for possible infection  secondary to a prior hematoma. Given history, physical exam, and work-up, I do not think she has underlying fracture, hip dislocation, variceal bleeding, acute active extravasation into right hip hematoma, intra abdominal hemorrhage, or intrathoracic hemorrhage.  MDM: Brandy Houston is a 64 y.o. female presents from dialysis due to concern for anemia.  Hemoglobin 6.1, given 1 unit PRBCs.  Dark stool on rectal exam and fecal occult  positive although this may be confounded by patient's p.o. iron supplements.  Patient has a history of chronic GI bleeding but recently had an endoscopy with cauterization of bleeding on 05/14/2020.  Thus, I am concerned that she has recurrent anemia despite this endoscopy which could be related to possible right hip trauma a week ago or recurrence of her GI bleeding.  This may also be related to her ESRD.  However, given patient's need for transfusion, right hip injury, and recurrent anemia, feel that she likely requires further work-up.  Plan to get a CT scan with contrast of the patient's right hip but she has contrast allergy.  Will admit to hospitalist, handoff given to Dr. Tonie Griffith.  Discussed with Dr. Tonie Griffith indication for right hip imaging and discussed MR versus premedication for CT scan.  He recommended MRI which was ordered.  Patient admitted in stable condition.  The plan for this patient was discussed with Dr. Regenia Skeeter, who voiced agreement and who oversaw evaluation and treatment of this patient.   Final Clinical Impression(s) / ED Diagnoses Final diagnoses:  None    Rx / DC Orders ED Discharge Orders    None       Aniza Shor, MD 05/27/20 8341    Sherwood Gambler, MD 05/27/20 1314

## 2020-05-26 NOTE — ED Notes (Signed)
Per pt, okay to use LT arm for BP and PIV. Pt states to not use RT arm.

## 2020-05-27 ENCOUNTER — Encounter (HOSPITAL_COMMUNITY): Payer: Self-pay | Admitting: Family Medicine

## 2020-05-27 ENCOUNTER — Inpatient Hospital Stay (HOSPITAL_COMMUNITY): Payer: Medicare Other

## 2020-05-27 DIAGNOSIS — I10 Essential (primary) hypertension: Secondary | ICD-10-CM | POA: Diagnosis not present

## 2020-05-27 DIAGNOSIS — K922 Gastrointestinal hemorrhage, unspecified: Secondary | ICD-10-CM | POA: Diagnosis not present

## 2020-05-27 DIAGNOSIS — E785 Hyperlipidemia, unspecified: Secondary | ICD-10-CM | POA: Diagnosis not present

## 2020-05-27 DIAGNOSIS — K5521 Angiodysplasia of colon with hemorrhage: Secondary | ICD-10-CM | POA: Diagnosis not present

## 2020-05-27 DIAGNOSIS — I132 Hypertensive heart and chronic kidney disease with heart failure and with stage 5 chronic kidney disease, or end stage renal disease: Secondary | ICD-10-CM | POA: Diagnosis not present

## 2020-05-27 DIAGNOSIS — Z8049 Family history of malignant neoplasm of other genital organs: Secondary | ICD-10-CM | POA: Diagnosis not present

## 2020-05-27 DIAGNOSIS — N189 Chronic kidney disease, unspecified: Secondary | ICD-10-CM | POA: Diagnosis not present

## 2020-05-27 DIAGNOSIS — S7001XA Contusion of right hip, initial encounter: Secondary | ICD-10-CM | POA: Diagnosis present

## 2020-05-27 DIAGNOSIS — Z720 Tobacco use: Secondary | ICD-10-CM | POA: Diagnosis not present

## 2020-05-27 DIAGNOSIS — K31819 Angiodysplasia of stomach and duodenum without bleeding: Secondary | ICD-10-CM | POA: Diagnosis not present

## 2020-05-27 DIAGNOSIS — Z8249 Family history of ischemic heart disease and other diseases of the circulatory system: Secondary | ICD-10-CM | POA: Diagnosis not present

## 2020-05-27 DIAGNOSIS — F32A Depression, unspecified: Secondary | ICD-10-CM | POA: Diagnosis present

## 2020-05-27 DIAGNOSIS — N186 End stage renal disease: Secondary | ICD-10-CM | POA: Diagnosis not present

## 2020-05-27 DIAGNOSIS — N184 Chronic kidney disease, stage 4 (severe): Secondary | ICD-10-CM

## 2020-05-27 DIAGNOSIS — Z992 Dependence on renal dialysis: Secondary | ICD-10-CM | POA: Diagnosis not present

## 2020-05-27 DIAGNOSIS — F419 Anxiety disorder, unspecified: Secondary | ICD-10-CM | POA: Diagnosis present

## 2020-05-27 DIAGNOSIS — B3781 Candidal esophagitis: Secondary | ICD-10-CM | POA: Diagnosis not present

## 2020-05-27 DIAGNOSIS — K5520 Angiodysplasia of colon without hemorrhage: Secondary | ICD-10-CM | POA: Diagnosis not present

## 2020-05-27 DIAGNOSIS — W1830XA Fall on same level, unspecified, initial encounter: Secondary | ICD-10-CM | POA: Diagnosis present

## 2020-05-27 DIAGNOSIS — M25451 Effusion, right hip: Secondary | ICD-10-CM

## 2020-05-27 DIAGNOSIS — M1990 Unspecified osteoarthritis, unspecified site: Secondary | ICD-10-CM | POA: Diagnosis present

## 2020-05-27 DIAGNOSIS — E876 Hypokalemia: Secondary | ICD-10-CM

## 2020-05-27 DIAGNOSIS — K31811 Angiodysplasia of stomach and duodenum with bleeding: Secondary | ICD-10-CM | POA: Diagnosis not present

## 2020-05-27 DIAGNOSIS — D631 Anemia in chronic kidney disease: Secondary | ICD-10-CM | POA: Diagnosis not present

## 2020-05-27 DIAGNOSIS — Z7982 Long term (current) use of aspirin: Secondary | ICD-10-CM | POA: Diagnosis not present

## 2020-05-27 DIAGNOSIS — Z20822 Contact with and (suspected) exposure to covid-19: Secondary | ICD-10-CM | POA: Diagnosis not present

## 2020-05-27 DIAGNOSIS — Z91041 Radiographic dye allergy status: Secondary | ICD-10-CM | POA: Diagnosis not present

## 2020-05-27 DIAGNOSIS — I5032 Chronic diastolic (congestive) heart failure: Secondary | ICD-10-CM

## 2020-05-27 DIAGNOSIS — R195 Other fecal abnormalities: Secondary | ICD-10-CM

## 2020-05-27 DIAGNOSIS — K2100 Gastro-esophageal reflux disease with esophagitis, without bleeding: Secondary | ICD-10-CM | POA: Diagnosis present

## 2020-05-27 DIAGNOSIS — S7002XA Contusion of left hip, initial encounter: Secondary | ICD-10-CM | POA: Diagnosis not present

## 2020-05-27 DIAGNOSIS — E1122 Type 2 diabetes mellitus with diabetic chronic kidney disease: Secondary | ICD-10-CM | POA: Diagnosis not present

## 2020-05-27 DIAGNOSIS — F17210 Nicotine dependence, cigarettes, uncomplicated: Secondary | ICD-10-CM | POA: Diagnosis not present

## 2020-05-27 DIAGNOSIS — Z79899 Other long term (current) drug therapy: Secondary | ICD-10-CM | POA: Diagnosis not present

## 2020-05-27 DIAGNOSIS — D62 Acute posthemorrhagic anemia: Secondary | ICD-10-CM | POA: Diagnosis not present

## 2020-05-27 DIAGNOSIS — Z89612 Acquired absence of left leg above knee: Secondary | ICD-10-CM | POA: Diagnosis not present

## 2020-05-27 LAB — POC OCCULT BLOOD, ED: Fecal Occult Bld: POSITIVE — AB

## 2020-05-27 LAB — CBC
HCT: 22.5 % — ABNORMAL LOW (ref 36.0–46.0)
Hemoglobin: 6.9 g/dL — CL (ref 12.0–15.0)
MCH: 29.9 pg (ref 26.0–34.0)
MCHC: 30.7 g/dL (ref 30.0–36.0)
MCV: 97.4 fL (ref 80.0–100.0)
Platelets: 177 10*3/uL (ref 150–400)
RBC: 2.31 MIL/uL — ABNORMAL LOW (ref 3.87–5.11)
RDW: 19.9 % — ABNORMAL HIGH (ref 11.5–15.5)
WBC: 5.3 10*3/uL (ref 4.0–10.5)
nRBC: 0.6 % — ABNORMAL HIGH (ref 0.0–0.2)

## 2020-05-27 LAB — BASIC METABOLIC PANEL
Anion gap: 13 (ref 5–15)
BUN: 24 mg/dL — ABNORMAL HIGH (ref 8–23)
CO2: 28 mmol/L (ref 22–32)
Calcium: 8 mg/dL — ABNORMAL LOW (ref 8.9–10.3)
Chloride: 98 mmol/L (ref 98–111)
Creatinine, Ser: 3.15 mg/dL — ABNORMAL HIGH (ref 0.44–1.00)
GFR, Estimated: 16 mL/min — ABNORMAL LOW (ref >60)
Glucose, Bld: 108 mg/dL — ABNORMAL HIGH (ref 70–99)
Potassium: 3.6 mmol/L (ref 3.5–5.1)
Sodium: 139 mmol/L (ref 135–145)

## 2020-05-27 LAB — HEMOGLOBIN AND HEMATOCRIT, BLOOD
HCT: 28.3 % — ABNORMAL LOW (ref 36.0–46.0)
Hemoglobin: 8.8 g/dL — ABNORMAL LOW (ref 12.0–15.0)

## 2020-05-27 LAB — RESP PANEL BY RT-PCR (FLU A&B, COVID) ARPGX2
Influenza A by PCR: NEGATIVE
Influenza B by PCR: NEGATIVE
SARS Coronavirus 2 by RT PCR: NEGATIVE

## 2020-05-27 LAB — MRSA PCR SCREENING: MRSA by PCR: NEGATIVE

## 2020-05-27 LAB — PREPARE RBC (CROSSMATCH)

## 2020-05-27 MED ORDER — ATORVASTATIN CALCIUM 10 MG PO TABS
10.0000 mg | ORAL_TABLET | Freq: Every day | ORAL | Status: DC
  Administered 2020-05-27 – 2020-05-28 (×3): 10 mg via ORAL
  Filled 2020-05-27 (×3): qty 1

## 2020-05-27 MED ORDER — SODIUM CHLORIDE 0.9% IV SOLUTION: Freq: Once | INTRAVENOUS | Status: DC

## 2020-05-27 MED ORDER — NICOTINE 7 MG/24HR TD PT24
7.0000 mg | MEDICATED_PATCH | Freq: Every day | TRANSDERMAL | Status: DC
  Administered 2020-05-27 – 2020-05-29 (×2): 7 mg via TRANSDERMAL
  Filled 2020-05-27 (×4): qty 1

## 2020-05-27 MED ORDER — ISOSORBIDE MONONITRATE ER 30 MG PO TB24
30.0000 mg | ORAL_TABLET | Freq: Every day | ORAL | Status: DC
  Administered 2020-05-27 – 2020-05-29 (×3): 30 mg via ORAL
  Filled 2020-05-27 (×3): qty 1

## 2020-05-27 MED ORDER — GABAPENTIN 100 MG PO CAPS: 200.0000 mg | ORAL_CAPSULE | ORAL | Status: DC

## 2020-05-27 MED ORDER — ACETAMINOPHEN 650 MG RE SUPP: 650.0000 mg | Freq: Four times a day (QID) | RECTAL | Status: DC | PRN

## 2020-05-27 MED ORDER — TRAZODONE HCL 50 MG PO TABS
50.0000 mg | ORAL_TABLET | Freq: Every evening | ORAL | Status: DC | PRN
  Administered 2020-05-28: 50 mg via ORAL
  Filled 2020-05-27: qty 1

## 2020-05-27 MED ORDER — GABAPENTIN 300 MG PO CAPS: 300.0000 mg | ORAL_CAPSULE | ORAL | Status: DC

## 2020-05-27 MED ORDER — CALCIUM ACETATE (PHOS BINDER) 667 MG PO CAPS
2001.0000 mg | ORAL_CAPSULE | Freq: Two times a day (BID) | ORAL | Status: DC
  Administered 2020-05-27 – 2020-05-29 (×4): 2001 mg via ORAL
  Filled 2020-05-27 (×5): qty 3

## 2020-05-27 MED ORDER — POLYETHYLENE GLYCOL 3350 17 G PO PACK: 17.0000 g | PACK | Freq: Every day | ORAL | Status: DC | PRN

## 2020-05-27 MED ORDER — CALCIUM ACETATE (PHOS BINDER) 667 MG PO CAPS
1334.0000 mg | ORAL_CAPSULE | Freq: Every day | ORAL | Status: DC
  Administered 2020-05-27 – 2020-05-29 (×3): 1334 mg via ORAL
  Filled 2020-05-27 (×3): qty 2

## 2020-05-27 MED ORDER — CALCIUM ACETATE (PHOS BINDER) 667 MG PO CAPS: 1334.0000 mg | ORAL_CAPSULE | ORAL | Status: DC

## 2020-05-27 MED ORDER — CALCIUM ACETATE (PHOS BINDER) 667 MG PO CAPS
1334.0000 mg | ORAL_CAPSULE | ORAL | Status: DC | PRN
  Filled 2020-05-27: qty 2

## 2020-05-27 MED ORDER — PANTOPRAZOLE SODIUM 20 MG PO TBEC
20.0000 mg | DELAYED_RELEASE_TABLET | Freq: Two times a day (BID) | ORAL | Status: DC
  Administered 2020-05-27: 20 mg via ORAL
  Filled 2020-05-27 (×2): qty 1

## 2020-05-27 MED ORDER — HYDROMORPHONE HCL 1 MG/ML IJ SOLN
0.5000 mg | INTRAMUSCULAR | Status: DC | PRN
  Administered 2020-05-28 – 2020-05-29 (×4): 0.5 mg via INTRAVENOUS
  Filled 2020-05-27 (×5): qty 1

## 2020-05-27 MED ORDER — DIPHENHYDRAMINE HCL 50 MG/ML IJ SOLN: 50.0000 mg | Freq: Once | INTRAMUSCULAR | Status: DC

## 2020-05-27 MED ORDER — GABAPENTIN 100 MG PO CAPS
200.0000 mg | ORAL_CAPSULE | ORAL | Status: DC
  Administered 2020-05-27 – 2020-05-28 (×6): 200 mg via ORAL
  Filled 2020-05-27 (×6): qty 2

## 2020-05-27 MED ORDER — HYDRALAZINE HCL 50 MG PO TABS
100.0000 mg | ORAL_TABLET | ORAL | Status: DC
  Administered 2020-05-27 – 2020-05-28 (×4): 100 mg via ORAL
  Filled 2020-05-27 (×5): qty 2

## 2020-05-27 MED ORDER — GABAPENTIN 300 MG PO CAPS
300.0000 mg | ORAL_CAPSULE | ORAL | Status: DC
  Administered 2020-05-29: 300 mg via ORAL
  Filled 2020-05-27: qty 1

## 2020-05-27 MED ORDER — CLONIDINE HCL 0.1 MG PO TABS
0.1000 mg | ORAL_TABLET | ORAL | Status: DC
  Administered 2020-05-27 – 2020-05-28 (×4): 0.1 mg via ORAL
  Filled 2020-05-27 (×4): qty 1

## 2020-05-27 MED ORDER — DIPHENHYDRAMINE HCL 25 MG PO CAPS: 50.0000 mg | ORAL_CAPSULE | Freq: Once | ORAL | Status: DC

## 2020-05-27 MED ORDER — PANTOPRAZOLE SODIUM 40 MG IV SOLR
40.0000 mg | Freq: Every day | INTRAVENOUS | Status: DC
  Administered 2020-05-28 – 2020-05-29 (×2): 40 mg via INTRAVENOUS
  Filled 2020-05-27 (×2): qty 40

## 2020-05-27 MED ORDER — SERTRALINE HCL 100 MG PO TABS
100.0000 mg | ORAL_TABLET | Freq: Every evening | ORAL | Status: DC
  Administered 2020-05-27 – 2020-05-28 (×2): 100 mg via ORAL
  Filled 2020-05-27 (×3): qty 1

## 2020-05-27 MED ORDER — ACETAMINOPHEN 325 MG PO TABS
650.0000 mg | ORAL_TABLET | Freq: Four times a day (QID) | ORAL | Status: DC | PRN
  Administered 2020-05-27: 650 mg via ORAL
  Filled 2020-05-27: qty 2

## 2020-05-27 MED ORDER — CARVEDILOL 3.125 MG PO TABS
3.1250 mg | ORAL_TABLET | Freq: Two times a day (BID) | ORAL | Status: DC
  Administered 2020-05-27 – 2020-05-29 (×6): 3.125 mg via ORAL
  Filled 2020-05-27 (×7): qty 1

## 2020-05-27 MED ORDER — CLONIDINE HCL 0.1 MG PO TABS: 0.1000 mg | ORAL_TABLET | ORAL | Status: DC

## 2020-05-27 MED ORDER — HYDROCORTISONE NA SUCCINATE PF 250 MG IJ SOLR
200.0000 mg | Freq: Once | INTRAMUSCULAR | Status: DC
  Filled 2020-05-27: qty 200

## 2020-05-27 NOTE — Anesthesia Preprocedure Evaluation (Addendum)
Anesthesia Evaluation  Patient identified by MRN, date of birth, ID band Patient awake    Reviewed: Allergy & Precautions, NPO status , Patient's Chart, lab work & pertinent test results  Airway Mallampati: II       Dental   Pulmonary neg pulmonary ROS, Current Smoker and Patient abstained from smoking.,    breath sounds clear to auscultation       Cardiovascular hypertension, Pt. on home beta blockers and Pt. on medications  Rhythm:Regular Rate:Normal     Neuro/Psych PSYCHIATRIC DISORDERS Anxiety Depression negative neurological ROS     GI/Hepatic GERD  Medicated,(+) Hepatitis -, C  Endo/Other  negative endocrine ROSdiabetes  Renal/GU CRFRenal disease     Musculoskeletal  (+) Arthritis , Unsteady gait   Abdominal   Peds  Hematology  (+) anemia , HLD   Anesthesia Other Findings anemia, hx AVM's poss GAVE  Reproductive/Obstetrics                            Anesthesia Physical Anesthesia Plan  ASA: III  Anesthesia Plan: General   Post-op Pain Management:    Induction: Intravenous  PONV Risk Score and Plan: Ondansetron  Airway Management Planned: Nasal Cannula and Simple Face Mask  Additional Equipment:   Intra-op Plan:   Post-operative Plan:   Informed Consent: I have reviewed the patients History and Physical, chart, labs and discussed the procedure including the risks, benefits and alternatives for the proposed anesthesia with the patient or authorized representative who has indicated his/her understanding and acceptance.       Plan Discussed with:   Anesthesia Plan Comments:         Anesthesia Quick Evaluation

## 2020-05-27 NOTE — Progress Notes (Signed)
PROGRESS NOTE    Brandy Houston  WPV:[id number] DOB: 11/21/1955 DOA: 05/26/2020 PCP: [name], MD   Brief Narrative:  Patient is 64 year old female with past medical history of ESRD on hemodialysis TTS, diabetes mellitus, left AKA, hepatitis C, congestive heart failure, esophagitis and antral gastric AVM which was treated with APC presented to emergency department due to low hemoglobin.  She denies abdominal pain, discomfort, nausea or vomiting.  She is on oral iron and says her stools still is dark.  She denies NSAID/blood thinner use.  Takes Protonix 20 mg twice daily at home.  She had EGD November 2021 for anemia and heme positive stool.  Patient was noted to have grade B esophagitis and antral gastric AVM which was treated with APC.  Upon arrival to ED: Patient's hemoglobin was noted to be 6.1, occult blood positive, vital signs remained stable, patient was given 1 unit PRBC.  GI consulted.  Patient admitted for further evaluation and management of acute blood loss anemia/GI bleed.  Assessment & Plan:   Acute blood loss anemia: -In the setting of GI bleed and large superficial subcutaneous hematoma-right hip -H&H upon arrival: 6.1.  Occult blood positive -Received 1 unit PRBC in ED.  Repeat hemoglobin: 6.9. -We will transfuse 1 more unit.  Monitor H&H closely.  Transfuse if hemoglobin less than 7. -GI consulted recommended EGD and small bowel enteroscopy tomorrow with Dr. Silverio Decamp.  Right hip hematoma:  -Patient had a fall last week -Reviewed MRI which showed large superficial subcutaneous hematoma septal torn labrum superiorly in the right hip joint. -Dilaudid as needed for pain control.  Monitor H&H closely. -Orthopedic surgery consult?  ESRD on hemodialysis-TTS: Patient appears euvolemic.  Last hemodialysis session yesterday. -Potassium: WNL. -INO's and monitor signs for fluid overload. -Nephrology consult tomorrow  Hypertension: Stable -Continue home meds Coreg,  clonidine, hydralazine, Imdur -Monitor blood pressure closely  Hyperlipidemia: Continue statin  Chronic diastolic CHF: Patient appears euvolemic on exam -Continue home meds statin, Coreg, Imdur  Depression/anxiety: Continue Zoloft, trazodone  GERD with esophagitis: Continue PPI  Tobacco abuse: Nicotine patch.  Counseled about cessation.  Left AKA: aware  DVT prophylaxis: SCD Code Status: Full code Family Communication:  None present at bedside.  Plan of care discussed with patient in length and she verbalized understanding and agreed with it. Disposition Plan: Home  Consultants:   GI  Procedures:   None  Antimicrobials:   None  Status is: Inpatient  Dispo: The patient is from: Home              Anticipated d/c is to: Home              Anticipated d/c date is: 2 days              Patient currently is not medically stable to d/c.  Subjective: Patient seen and examined in the ED.  Tells me that she feels better after receiving blood.  She continues to have swelling and pain in right upper thigh.  Requested for pain medication.  Denies nausea, vomiting, epigastric pain, chest pain, shortness of breath, headache or blurry vision.  Objective: Vitals:   05/27/20 1123 05/27/20 1200 05/27/20 1210 05/27/20 1320  BP: (!) 141/76 138/68  139/67  Pulse: 83 80  79  Resp: 20 15  20   Temp:   98.3 F (36.8 C) 98.5 F (36.9 C)  TempSrc:   Oral Oral  SpO2: 100% 97%  100%  Weight:      Height:  Intake/Output Summary (Last 24 hours) at 05/27/2020 1441 Last data filed at 05/27/2020 1320 Gross per 24 hour  Intake 1020 ml  Output --  Net 1020 ml   Filed Weights   05/26/20 1628  Weight: 59 kg    Examination:  General exam: Appears calm and comfortable, elderly, on room air, communicating well respiratory system: Clear to auscultation. Respiratory effort normal. Cardiovascular system: S1 & S2 heard, RRR. No JVD, murmurs, rubs, gallops or clicks. No pedal  edema. Gastrointestinal system: Abdomen is nondistended, soft and nontender. No organomegaly or masses felt. Normal bowel sounds heard. Central nervous system: Alert and oriented. No focal neurological deficits. Extremities: Left AKA, right upper thigh swelling noted, firm, nonerythematous, tender on palpation. Skin: No rashes, lesions or ulcers Psychiatry: Judgement and insight appear normal. Mood & affect appropriate.    Data Reviewed: I have personally reviewed following labs and imaging studies  CBC: Recent Labs  Lab 05/26/20 1708 05/27/20 0313  WBC 5.4 5.3  HGB 6.1* 6.9*  HCT 19.5* 22.5*  MCV 98.0 97.4  PLT 206 132   Basic Metabolic Panel: Recent Labs  Lab 05/26/20 1708 05/27/20 0313  NA 137 139  K 3.1* 3.6  CL 93* 98  CO2 34* 28  GLUCOSE 130* 108*  BUN 13 24*  CREATININE 2.19* 3.15*  CALCIUM 7.9* 8.0*   GFR: Estimated Creatinine Clearance: 16.6 mL/min (A) (by C-G formula based on SCr of 3.15 mg/dL (H)). Liver Function Tests: Recent Labs  Lab 05/26/20 1708  AST 12*  ALT 19  ALKPHOS 69  BILITOT 0.6  PROT 6.0*  ALBUMIN 2.9*   No results for input(s): LIPASE, AMYLASE in the last 168 hours. No results for input(s): AMMONIA in the last 168 hours. Coagulation Profile: No results for input(s): INR, PROTIME in the last 168 hours. Cardiac Enzymes: No results for input(s): CKTOTAL, CKMB, CKMBINDEX, TROPONINI in the last 168 hours. BNP (last 3 results) No results for input(s): PROBNP in the last 8760 hours. HbA1C: No results for input(s): HGBA1C in the last 72 hours. CBG: No results for input(s): GLUCAP in the last 168 hours. Lipid Profile: No results for input(s): CHOL, HDL, LDLCALC, TRIG, CHOLHDL, LDLDIRECT in the last 72 hours. Thyroid Function Tests: No results for input(s): TSH, T4TOTAL, FREET4, T3FREE, THYROIDAB in the last 72 hours. Anemia Panel: No results for input(s): VITAMINB12, FOLATE, FERRITIN, TIBC, IRON, RETICCTPCT in the last 72  hours. Sepsis Labs: No results for input(s): PROCALCITON, LATICACIDVEN in the last 168 hours.  Recent Results (from the past 240 hour(s))  Resp Panel by RT-PCR (Flu A&B, Covid) Nasopharyngeal Swab     Status: None   Collection Time: 05/27/20  2:46 AM   Specimen: Nasopharyngeal Swab; Nasopharyngeal(NP) swabs in vial transport medium  Result Value Ref Range Status   SARS Coronavirus 2 by RT PCR NEGATIVE NEGATIVE Final    Comment: (NOTE) SARS-CoV-2 target nucleic acids are NOT DETECTED.  The SARS-CoV-2 RNA is generally detectable in upper respiratory specimens during the acute phase of infection. The lowest concentration of SARS-CoV-2 viral copies this assay can detect is 138 copies/mL. A negative result does not preclude SARS-Cov-2 infection and should not be used as the sole basis for treatment or other patient management decisions. A negative result may occur with  improper specimen collection/handling, submission of specimen other than nasopharyngeal swab, presence of viral mutation(s) within the areas targeted by this assay, and inadequate number of viral copies(<138 copies/mL). A negative result must be combined with clinical observations, patient history,  and epidemiological information. The expected result is Negative.  Fact Sheet for Patients:  EntrepreneurPulse.com.au  Fact Sheet for Healthcare Providers:  IncredibleEmployment.be  This test is no t yet approved or cleared by the [place] FDA and  has been authorized for detection and/or diagnosis of SARS-CoV-2 by FDA under an Emergency Use Authorization (EUA). This EUA will remain  in effect (meaning this test can be used) for the duration of the COVID-19 declaration under Section 564(b)(1) of the Act, 21 U.S.C.section 360bbb-3(b)(1), unless the authorization is terminated  or revoked sooner.       Influenza A by PCR NEGATIVE NEGATIVE Final   Influenza B by PCR NEGATIVE  NEGATIVE Final    Comment: (NOTE) The Xpert Xpress SARS-CoV-2/FLU/RSV plus assay is intended as an aid in the diagnosis of influenza from Nasopharyngeal swab specimens and should not be used as a sole basis for treatment. Nasal washings and aspirates are unacceptable for Xpert Xpress SARS-CoV-2/FLU/RSV testing.  Fact Sheet for Patients: EntrepreneurPulse.com.au  Fact Sheet for Healthcare Providers: IncredibleEmployment.be  This test is not yet approved or cleared by the [place] FDA and has been authorized for detection and/or diagnosis of SARS-CoV-2 by FDA under an Emergency Use Authorization (EUA). This EUA will remain in effect (meaning this test can be used) for the duration of the COVID-19 declaration under Section 564(b)(1) of the Act, 21 U.S.C. section 360bbb-3(b)(1), unless the authorization is terminated or revoked.  Performed at [facility], [place] [address], [place], [place] [address]       Radiology Studies: MR HIP RIGHT WO CONTRAST  Addendum Date: 05/27/2020   ADDENDUM REPORT: 05/27/2020 09:41 ADDENDUM: Patient has a subtle torn labrum superiorly in the RIGHT hip joint noted on images 13-17 of series 11. Electronically Signed   By: Suzy Bouchard M.D.   On: 05/27/2020 09:41   Result Date: 05/27/2020 CLINICAL DATA:  FALL 1 WEEK PRIOR. SWELLING OF THE RIGHT HIP. LOW HEMOGLOBIN. NORMAL WHITE BLOOD CELL COUNT EXAM: MR OF THE RIGHT HIP WITHOUT CONTRAST TECHNIQUE: Multiplanar, multisequence MR imaging was performed. No intravenous contrast was administered. COMPARISON:  None. FINDINGS: Bones: No evidence of fracture of the RIGHT hip. No sacral fracture. Hips are located. Articular cartilage and labrum Articular cartilage:  No acute findings Labrum:  No acute finding Joint or bursal effusion Joint effusion:  No effusion Bursae: No bursitis Muscles and tendons Muscles and tendons:  Unremarkable Other findings Miscellaneous: There  is an ovoid mass within the subcutaneous tissue of the RIGHT hip overlying the greater trochanter measuring 13.0 x 4.5 by 5.5 cm (volume = 170 cm^3). There is edema within the subcutaneous tissue surrounding the mass. Findings are most consistent with hematoma associated with trauma. No evidence of abscess or infection. IMPRESSION: 1. Large superficial subcutaneous hematoma overlying the RIGHT hip. 2. No evidence of abscess. 3. No RIGHT hip fracture or infection. Electronically Signed: By: Suzy Bouchard M.D. On: 05/27/2020 04:52    Scheduled Meds:  sodium chloride   Intravenous Once   sodium chloride   Intravenous Once   atorvastatin  10 mg Oral QHS   calcium acetate  1,334 mg Oral Q lunch   calcium acetate  2,001 mg Oral BID WC   carvedilol  3.125 mg Oral BID   cloNIDine  0.1 mg Oral 2 times per day on Sun Mon Wed Fri   gabapentin  200 mg Oral 3 times per day on Sun Mon Wed Fri   [START ON 05/29/2020] gabapentin  300  mg Oral 2 times per day on Tue Thu Sat   hydrALAZINE  100 mg Oral 2 times per day on Sun Mon Wed Fri   isosorbide mononitrate  30 mg Oral Daily   nicotine  7 mg Transdermal Daily   pantoprazole (PROTONIX) IV  40 mg Intravenous Daily   sertraline  100 mg Oral QPM   Continuous Infusions:   LOS: 0 days   Time spent: 35 minutes   Andie Mungin Loann Quill, MD [facility]  If 7PM-7AM, please contact night-coverage [email or web address] 05/27/2020, 2:41 PM

## 2020-05-27 NOTE — Consult Note (Addendum)
Consultation  Referring Provider:  [facility]/ Pahwani Primary Care Physician:  [name], MD Primary Gastroenterologist:  Dr. Silverio Decamp   Reason for Consultation:   Recurrent anemia, heme+ stool  HPI: Brandy Houston is a 64 y.o. female, known to the GI service, who we are asked to see today for recurrent profound anemia and heme positive stool. Patient has history of end-stage renal disease, on hemodialysis, and post dialysis on Thursday, 05/24/2020 was noted to have hemoglobin of 6.  She was directed to the emergency room where she was admitted yesterday. Patient has history of chronic anemia, congestive heart failure, adult onset diabetes mellitus, she is status post left AKA amputation, has history of congestive heart failure, and prior hep C which was treated. She is being transfused 2 units of packed RBCs, hemoglobin at 3 AM today 6.9.  She denies any abdominal pain or discomfort, no nausea or vomiting.  She is on oral iron and says her stools stay dark, she has not been aware of any overt melena or hematochezia.  She denies any aspirin or NSAID use.  She has been on Protonix 20 mg twice daily at home.  Patient just had EGD in November 2021 for anemia and heme positive stool.  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 She also had colonoscopy and EGD in August 2021.  At colonoscopy she was noted to have scattered pandiverticulosis and internal hemorrhoids.  At EGD had mild esophagitis, and gastric AVMs as well as prepyloric AVMs raising question of GA VE.[name] She was also noted to have 5 mm erosions in the stomach scattered.  Biopsy showed mild chronic gastritis, duodenal biopsies were negative.   Past Medical History:  Diagnosis Date  . Allergy   . Anemia   . Anxiety   . Arthritis    "in my joints" (03/10/2017)  . Chronic diastolic CHF (congestive heart failure) ([place])   . CKD (chronic kidney disease), stage IV ([place])    stage IV.  previous HD, none currently 10/30/15 (confirmed 02/05/2017 & 03/10/2017)  . Depression    Chronic  . Diabetic peripheral neuropathy ([place])   . Dysrhythmia    tachycardia, normal ECHO 08-09-14  . GERD (gastroesophageal reflux disease)   . Heart murmur     dx'd 02/05/2017  . Hepatitis C    "tx'd in 2016; I'm negative now" (02/05/2017)  . History of blood transfusion 2017   "w/knee replacement"  . Hyperlipidemia   . Hypertension   . Osteoarthritis of left knee   . Peripheral neuropathy   . Protein calorie malnutrition ([place])   . Septic arthritis ([place])   . Slow transit constipation   . Symptomatic anemia 01/27/2020  . Type II diabetes mellitus ([name])    IDDM  . Uncontrolled hypertension 02/18/2013  . Unsteady gait     Past Surgical History:  Procedure Laterality Date  . A/V FISTULAGRAM Right 07/07/2018   Procedure: A/V FISTULAGRAM;  Surgeon: Marty Heck, MD;  Location: [facility];  Service: Cardiovascular;  Laterality: Right;  . AMPUTATION Left 03/20/2017   Procedure: LEFT ABOVE KNEE AMPUTATION;  Surgeon: Newt Minion, MD;  Location: [facility];  Service: Orthopedics;  Laterality: Left;  . APPENDECTOMY    . AV FISTULA PLACEMENT Left 09/13/2014   Procedure: Brachial Artery to Brachial Vein Gortex Four - Seven Stretch GRAFT INSERTION Left Forearm;  Surgeon: Mal Misty, MD;  Location: [place];  Service:  Vascular;  Laterality: Left;  . BASCILIC VEIN TRANSPOSITION Right 10/26/2017   Procedure: BASILIC VEIN TRANSPOSITION FIRST STAGE RIGHT ARM;  Surgeon: Conrad Pennville, MD;  Location: [facility];  Service: Vascular;  Laterality: Right;  . BASCILIC VEIN TRANSPOSITION Right 02/01/2018   Procedure: SECOND STAGE BASILIC VEIN TRANSPOSITION RIGHT UPPER EXTREMITY;  Surgeon: Marty Heck, MD;  Location: [place];  Service: Vascular;  Laterality: Right;  . CHOLECYSTECTOMY OPEN    . COLON SURGERY    . COLONOSCOPY    . ESOPHAGOGASTRODUODENOSCOPY (EGD) WITH PROPOFOL N/A 05/14/2020   Procedure:  ESOPHAGOGASTRODUODENOSCOPY (EGD) WITH PROPOFOL;  Surgeon: Mauri Pole, MD;  [facility];  Service: Endoscopy;  Laterality: N/A;  with APC  . EXCISIONAL TOTAL KNEE ARTHROPLASTY WITH ANTIBIOTIC SPACERS Left 02/06/2017   Procedure: Incisional total iknee with antibiotic spacer ;  Surgeon: Leandrew Koyanagi, MD;  Location: [facility];  Service: Orthopedics;  Laterality: Left;  . HOT HEMOSTASIS N/A 05/14/2020   Procedure: HOT HEMOSTASIS (ARGON PLASMA COAGULATION/BICAP);  Surgeon: Mauri Pole, MD;  Location: [facility];  Service: Endoscopy;  Laterality: N/A;  . INTRAVASCULAR PRESSURE WIRE/FFR STUDY N/A 04/13/2020   Procedure: INTRAVASCULAR PRESSURE WIRE/FFR STUDY;  Surgeon: Burnell Blanks, MD;  Location: [facility] [place];  Service: Cardiovascular;  Laterality: N/A;  . IR FLUORO GUIDE CV LINE RIGHT  02/10/2017  . IR REMOVAL TUN CV CATH W/O FL  04/01/2017  . IR US GUIDE VASC ACCESS RIGHT  02/10/2017  . IRRIGATION AND DEBRIDEMENT KNEE Left 03/12/2017   Procedure: IRRIGATION AND DEBRIDEMENT LEFT KNEE WITH WOUND VAC APPLICATION;  Surgeon: Leandrew Koyanagi, MD;  Location: [place];  Service: Orthopedics;  Laterality: Left;  . JOINT REPLACEMENT    . KNEE ARTHROSCOPY Right 08/10/2014   Procedure: ARTHROSCOPY I & D KNEE;  Surgeon: Marianna Payment, MD;  [facility];  Service: Orthopedics;  Laterality: Right;  . KNEE ARTHROSCOPY Left 08/11/2014   Procedure: ARTHROSCOPIC WASHOUT LEFT KNEE;  Surgeon: Marianna Payment, MD;  Location: [place];  Service: Orthopedics;  Laterality: Left;  . KNEE ARTHROSCOPY Left 08/19/2014   Procedure: ARTHROSCOPIC WASHOUT LEFT KNEE;  Surgeon: Leandrew Koyanagi, MD;  Location: [place];  Service: Orthopedics;  Laterality: Left;  . KNEE ARTHROSCOPY WITH LATERAL MENISECTOMY Left 04/04/2015   Procedure: AND PARTIAL LATERAL MENISECTOMY;  Surgeon: Leandrew Koyanagi, MD;  Location: [place];  Service: Orthopedics;  Laterality: Left;  . KNEE ARTHROSCOPY WITH  MEDIAL MENISECTOMY Left 04/04/2015   Procedure: LEFT KNEE ARTHROSCOPY WITH PARTIAL MEDIAL MENISCECTOMY  AND SYNOVECTOMY;  Surgeon: Leandrew Koyanagi, MD;  Location: [place];  Service: Orthopedics;  Laterality: Left;  . PERIPHERAL VASCULAR BALLOON ANGIOPLASTY  07/07/2018   Procedure: PERIPHERAL VASCULAR BALLOON ANGIOPLASTY;  Surgeon: Marty Heck, MD;  Location: [facility];  Service: Cardiovascular;;  right AV fistula  . POLYPECTOMY    . RIGHT/LEFT HEART CATH AND CORONARY ANGIOGRAPHY N/A 04/13/2020   Procedure: RIGHT/LEFT HEART CATH AND CORONARY ANGIOGRAPHY;  Surgeon: Burnell Blanks, MD;  Location: [place] [facility];  Service: Cardiovascular;  Laterality: N/A;  . SHOULDER ARTHROSCOPY Bilateral 08/10/2014   Procedure: I & D BILATERAL SHOULDERS ;  Surgeon: Marianna Payment, MD;  [facility];  Service: Orthopedics;  Laterality: Bilateral;  . SMALL INTESTINE SURGERY     Due to Small Bowel Obstruction; "fixed it when they did my gallbladder OR"  . TEE WITHOUT CARDIOVERSION N/A 08/14/2014   Procedure: TRANSESOPHAGEAL ECHOCARDIOGRAM (  TEE);  Surgeon: Thayer Headings, MD;  Location: [place];  Service: Cardiovascular;  Laterality: N/A;  . TENOSYNOVECTOMY Right 08/11/2014   Procedure: RIGHT WRIST IRRIGATION AND DEBRIDEMENT, TENOSYNOVECTOMY;  Surgeon: Marianna Payment, MD;  Location: [facility];  Service: Orthopedics;  Laterality: Right;  . TOTAL KNEE ARTHROPLASTY Left 11/07/2015   Procedure: LEFT TOTAL KNEE ARTHROPLASTY WITH REVISION OF IMPLANTS;  Surgeon: Leandrew Koyanagi, MD;  Location: [facility];  Service: Orthopedics;  Laterality: Left;  . TUBAL LIGATION      Prior to Admission medications   Medication Sig Start Date End Date Taking? Authorizing Provider  Accu-Chek Softclix Lancets lancets Check sugars three times a day for E11.65 09/20/18  Yes [name], MD  acetaminophen (TYLENOL) 500 MG tablet Take 500-1,000 mg by mouth every 6 (six) hours as needed (for  pain.).   Yes [provider]  aspirin EC 81 MG tablet Take 81 mg by mouth daily.   Yes [provider]  atorvastatin (LIPITOR) 10 MG tablet TAKE 1 TABLET BY MOUTH AT BEDTIME . APPOINTMENT REQUIRED FOR FUTURE REFILLS Patient taking differently: Take 10 mg by mouth at bedtime. 10/31/19  Yes [name], MD  blood glucose meter kit and supplies KIT Dispense based on patient and insurance preference. Use up to four times daily as directed 10/13/19  Yes [name], MD  Blood Glucose Monitoring Suppl (ACCU-CHEK GUIDE) w/Device KIT 1 Device by Does not apply route daily. 10/31/19  Yes [name], MD  calcium acetate (PHOSLO) 667 MG capsule Take 1,334-2,001 mg by mouth See admin instructions. Take 3 capsules (2001 mg) by mouth with breakfast, take 2 capsules (1334 mg) by mouth with lunch, take 3 capsules (2001 mg) by mouth with supper & take 2 capsules (1334 mg) by mouth with snacks. 01/13/19  Yes [provider]  carvedilol (COREG) 3.125 MG tablet Take 1 tablet (3.125 mg total) by mouth 2 (two) times daily. 04/04/20 07/03/20 Yes Donato Heinz, MD  cloNIDine (CATAPRES) 0.1 MG tablet Take 0.1 mg by mouth See admin instructions. Bid on non-dialysis days( m,w,f,sun) 03/29/20  Yes [provider]  diphenhydrAMINE (BENADRYL) 25 mg capsule Take 25 mg by mouth every 6 (six) hours as needed for allergies.   Yes [provider]  gabapentin (NEURONTIN) 100 MG capsule Take 200 mg by mouth See admin instructions. Take 2 capsules (200 mg) by mouth 3 times on Sundays, Mondays, Wednesdays, & Fridays. 11/01/18  Yes [provider]  gabapentin (NEURONTIN) 300 MG capsule Take 300 mg by mouth See admin instructions. Take 1 capsule (300 mg) by mouth twice daily on Tuesdays, Thursdays, & Saturdays.   Yes [provider]  glucose blood (ACCU-CHEK GUIDE) test strip Check blood sugar up to 4 times a day 10/20/19  Yes [name], MD  hydrALAZINE  (APRESOLINE) 100 MG tablet Take 100 mg by mouth See admin instructions. Bid on non-dialysis days.( m,w,f,sun)   Yes [provider]  isosorbide mononitrate (IMDUR) 30 MG 24 hr tablet Take 1 tablet (30 mg total) by mouth daily. 04/16/20 07/15/20 Yes Cheryln Manly, NP  Lancet Devices [facility]) lancets Check sugars TID for E11.65 01/29/15  Yes Chari Manning A, NP  lidocaine-prilocaine (EMLA) cream Apply 1 application topically Every Tuesday,Thursday,and Saturday with dialysis. On days of dialysis 3x week 10/25/18  Yes [provider]  methocarbamol (ROBAXIN) 500 MG tablet Take 500 mg by mouth every 8 (eight) hours as needed for muscle spasms.  06/08/19  Yes [provider]  Methoxy PEG-Epoetin Beta (MIRCERA IJ) Get it at dialysis 06/30/19 06/28/20 Yes [provider]  multivitamin (RENA-VIT) TABS tablet Take 1 tablet by mouth daily. 03/01/19  Yes [provider]  nitroGLYCERIN (NITROSTAT) 0.4 MG SL tablet Place 1 tablet (0.4 mg total) under the tongue every 5 (five) minutes as needed. 04/25/20 07/24/20 Yes Donato Heinz, MD  pantoprazole (PROTONIX) 20 MG tablet Take 1 tablet (20 mg total) by mouth 2 (two) times daily before a meal. 05/21/20  Yes Nandigam, Venia Minks, MD  polyethylene glycol (MIRALAX / GLYCOLAX) 17 g packet Take 17 g by mouth daily as needed for moderate constipation. 12/10/18  Yes [name], MD  sertraline (ZOLOFT) 100 MG tablet Take 1 tablet by mouth once daily Patient taking differently: Take 100 mg by mouth every evening. 03/26/20  Yes [name], MD  traZODone (DESYREL) 50 MG tablet Take 50 mg by mouth at bedtime as needed for sleep.  10/27/18  Yes [provider]    Current Facility-Administered Medications  Medication Dose Route Frequency Provider Last Rate Last Admin  . 0.9 %  sodium chloride infusion (Manually program via Guardrails IV Fluids)   Intravenous Once Hendley, Cornelia, MD      . 0.9 %   sodium chloride infusion (Manually program via Guardrails IV Fluids)   Intravenous Once Pahwani, Rinka R, MD      . acetaminophen (TYLENOL) tablet 650 mg  650 mg Oral Q6H PRN Chotiner, Yevonne Aline, MD   650 mg at 05/27/20 0242   Or  . acetaminophen (TYLENOL) suppository 650 mg  650 mg Rectal Q6H PRN Chotiner, Yevonne Aline, MD      . atorvastatin (LIPITOR) tablet 10 mg  10 mg Oral QHS Chotiner, Yevonne Aline, MD   10 mg at 05/27/20 0242  . calcium acetate (PHOSLO) capsule 1,334 mg  1,334 mg Oral Q lunch Chotiner, Yevonne Aline, MD      . calcium acetate (PHOSLO) capsule 1,334 mg  1,334 mg Oral PRN Chotiner, Yevonne Aline, MD      . calcium acetate (PHOSLO) capsule 2,001 mg  2,001 mg Oral BID WC Chotiner, Yevonne Aline, MD   2,001 mg at 05/27/20 0814  . carvedilol (COREG) tablet 3.125 mg  3.125 mg Oral BID Chotiner, Yevonne Aline, MD   3.125 mg at 05/27/20 0242  . cloNIDine (CATAPRES) tablet 0.1 mg  0.1 mg Oral 2 times per day on Sun Mon Wed Fri Chotiner, Yevonne Aline, MD      . gabapentin (NEURONTIN) capsule 200 mg  200 mg Oral 3 times per day on Sun Mon Wed Fri Chotiner, Yevonne Aline, MD      . Derrill Memo ON 05/29/2020] gabapentin (NEURONTIN) capsule 300 mg  300 mg Oral 2 times per day on Tue Thu Sat Chotiner, Yevonne Aline, MD      . hydrALAZINE (APRESOLINE) tablet 100 mg  100 mg Oral 2 times per day on Sun Mon Wed Fri Chotiner, Yevonne Aline, MD      . HYDROmorphone (DILAUDID) injection 0.5 mg  0.5 mg Intravenous Q4H PRN Pahwani, Rinka R, MD      . isosorbide mononitrate (IMDUR) 24 hr tablet 30 mg  30 mg Oral Daily Chotiner, Yevonne Aline, MD      . nicotine (NICODERM CQ - dosed in mg/24 hr) patch 7 mg  7 mg Transdermal Daily Chotiner, Yevonne Aline, MD      . pantoprazole (PROTONIX) EC tablet 20 mg  20 mg  Oral BID AC Chotiner, Yevonne Aline, MD   20 mg at 05/27/20 0813  . polyethylene glycol (MIRALAX / GLYCOLAX) packet 17 g  17 g Oral Daily PRN Chotiner, Yevonne Aline, MD      . sertraline (ZOLOFT) tablet 100 mg  100 mg Oral QPM Chotiner, Yevonne Aline, MD       . traZODone (DESYREL) tablet 50 mg  50 mg Oral QHS PRN Chotiner, Yevonne Aline, MD       Current Outpatient Medications  Medication Sig Dispense Refill  . Accu-Chek Softclix Lancets lancets Check sugars three times a day for E11.65 100 each 12  . acetaminophen (TYLENOL) 500 MG tablet Take 500-1,000 mg by mouth every 6 (six) hours as needed (for pain.).    [name] aspirin EC 81 MG tablet Take 81 mg by mouth daily.    [name] atorvastatin (LIPITOR) 10 MG tablet TAKE 1 TABLET BY MOUTH AT BEDTIME . APPOINTMENT REQUIRED FOR FUTURE REFILLS (Patient taking differently: Take 10 mg by mouth at bedtime.) 90 tablet 3  . blood glucose meter kit and supplies KIT Dispense based on patient and insurance preference. Use up to four times daily as directed 1 each 0  . Blood Glucose Monitoring Suppl (ACCU-CHEK GUIDE) w/Device KIT 1 Device by Does not apply route daily. 1 kit 3  . calcium acetate (PHOSLO) 667 MG capsule Take 1,334-2,001 mg by mouth See admin instructions. Take 3 capsules (2001 mg) by mouth with breakfast, take 2 capsules (1334 mg) by mouth with lunch, take 3 capsules (2001 mg) by mouth with supper & take 2 capsules (1334 mg) by mouth with snacks.    . carvedilol (COREG) 3.125 MG tablet Take 1 tablet (3.125 mg total) by mouth 2 (two) times daily. 180 tablet 3  . cloNIDine (CATAPRES) 0.1 MG tablet Take 0.1 mg by mouth See admin instructions. Bid on non-dialysis days( m,w,f,sun)    . diphenhydrAMINE (BENADRYL) 25 mg capsule Take 25 mg by mouth every 6 (six) hours as needed for allergies.    [name] gabapentin (NEURONTIN) 100 MG capsule Take 200 mg by mouth See admin instructions. Take 2 capsules (200 mg) by mouth 3 times on Sundays, Mondays, Wednesdays, & Fridays.    [name] gabapentin (NEURONTIN) 300 MG capsule Take 300 mg by mouth See admin instructions. Take 1 capsule (300 mg) by mouth twice daily on Tuesdays, Thursdays, & Saturdays.    [name] glucose blood (ACCU-CHEK GUIDE) test strip Check blood sugar up to 4 times a day 100  each 12  . hydrALAZINE (APRESOLINE) 100 MG tablet Take 100 mg by mouth See admin instructions. Bid on non-dialysis days.( m,w,f,sun)    . isosorbide mononitrate (IMDUR) 30 MG 24 hr tablet Take 1 tablet (30 mg total) by mouth daily. 90 tablet 0  . Lancet Devices (ACCU-CHEK SOFTCLIX) lancets Check sugars TID for E11.65 1 each 10  . lidocaine-prilocaine (EMLA) cream Apply 1 application topically Every Tuesday,Thursday,and Saturday with dialysis. On days of dialysis 3x week    . methocarbamol (ROBAXIN) 500 MG tablet Take 500 mg by mouth every 8 (eight) hours as needed for muscle spasms.     . Methoxy PEG-Epoetin Beta (MIRCERA IJ) Get it at dialysis    . multivitamin (RENA-VIT) TABS tablet Take 1 tablet by mouth daily.    . nitroGLYCERIN (NITROSTAT) 0.4 MG SL tablet Place 1 tablet (0.4 mg total) under the tongue every 5 (five) minutes as needed. 25 tablet 3  . pantoprazole (PROTONIX) 20 MG tablet Take 1 tablet (20 mg  total) by mouth 2 (two) times daily before a meal. 60 tablet 11  . polyethylene glycol (MIRALAX / GLYCOLAX) 17 g packet Take 17 g by mouth daily as needed for moderate constipation.    . sertraline (ZOLOFT) 100 MG tablet Take 1 tablet by mouth once daily (Patient taking differently: Take 100 mg by mouth every evening.) 90 tablet 3  . traZODone (DESYREL) 50 MG tablet Take 50 mg by mouth at bedtime as needed for sleep.       Allergies as of 05/26/2020 - Review Complete 05/25/2020  Allergen Reaction Noted  . Compazine [prochlorperazine] Shortness Of Breath, Swelling, and Other (See Comments) 11/30/2012  . Shellfish-derived products Anaphylaxis 11/04/2013  . Iodinated diagnostic agents Hives and Rash 11/04/2013  . Omnipaque [iohexol] Hives 10/18/2011  . Tape  07/07/2018  . Sulfa antibiotics Rash 06/20/2013    Family History  Problem Relation Age of Onset  . Uterine cancer Mother   . Heart disease Father   . Hypertension Sister   . Uterine cancer Sister   . Cancer Brother         unsure type  . Diabetes Brother   . Hypertension Brother   . Colon cancer Neg Hx   . Colon polyps Neg Hx   . Esophageal cancer Neg Hx   . Rectal cancer Neg Hx   . Stomach cancer Neg Hx     Social History   Socioeconomic History  . Marital status: Single    Spouse name: Not on file  . Number of children: 3  . Years of education: Not on file  . Highest education level: Not on file  Occupational History  . Occupation: disabled  Tobacco Use  . Smoking status: Current Every Day Smoker    Packs/day: 0.50    Years: 35.00    Pack years: 17.50    Types: Cigarettes  . Smokeless tobacco: Never Used  Vaping Use  . Vaping Use: Never used  Substance and Sexual Activity  . Alcohol use: Not Currently    Alcohol/week: 0.0 standard drinks    Comment: 03/10/2017 "I drink a wine cooler a few times/year"  . Drug use: No  . Sexual activity: Not Currently  Other Topics Concern  . Not on file  Social History Narrative  . Not on file   Social Determinants of Health   Financial Resource Strain: Not on file  Food Insecurity: Not on file  Transportation Needs: Not on file  Physical Activity: Not on file  Stress: Not on file  Social Connections: Not on file  Intimate Partner Violence: Not on file    Review of Systems: Pertinent positive and negative review of systems were noted in the above HPI section.  All other review of systems was otherwise negative.  Physical Exam: Vital signs in last 24 hours: Temp:  [98.2 F (36.8 C)-99 F (37.2 C)] 98.3 F (36.8 C) (12/12 0755) Pulse Rate:  [75-110] 82 (12/12 0755) Resp:  [10-21] 20 (12/12 0755) BP: (115-146)/(48-87) 133/69 (12/12 0755) SpO2:  [91 %-100 %] 100 % (12/12 0755) Weight:  [59 kg] 59 kg (12/11 1628)   General:   Alert,  Well-developed, chronically ill-appearing African-American female pleasant and cooperative in NAD Head:  Normocephalic and atraumatic. Eyes:  Sclera clear, no icterus.   Conjunctiva pale. Ears:  Normal  auditory acuity. Nose:  No deformity, discharge,  or lesions. Mouth:  No deformity or lesions.   Neck:  Supple; no masses or thyromegaly. Lungs:  Clear throughout to auscultation.  No wheezes, crackles, or rhonchi.  Heart:  Regular rate and rhythm; systolic murmur Abdomen:  Soft,nontender, BS active,nonpalp mass or hsm.   Rectal: Not done Msk:  Symmetrical without gross deformities. . Pulses:  Normal pulses noted. Extremities: Post left AKA. Neurologic:  Alert and  oriented x4;  grossly normal neurologically. Skin:  Intact without significant lesions or rashes.. Psych:  Alert and cooperative. Normal mood and affect.  Intake/Output from previous day: 12/11 0701 - 12/12 0700 In: 675 [Blood:675] Out: -  Intake/Output this shift: No intake/output data recorded.  Lab Results: Recent Labs    05/26/20 1708 05/27/20 0313  WBC 5.4 5.3  HGB 6.1* 6.9*  HCT 19.5* 22.5*  PLT 206 177   BMET Recent Labs    05/26/20 1708 05/27/20 0313  NA 137 139  K 3.1* 3.6  CL 93* 98  CO2 34* 28  GLUCOSE 130* 108*  BUN 13 24*  CREATININE 2.19* 3.15*  CALCIUM 7.9* 8.0*   LFT Recent Labs    05/26/20 1708  PROT 6.0*  ALBUMIN 2.9*  AST 12*  ALT 19  ALKPHOS 35  BILITOT 0.6     IMPRESSION:  28. 64 year old African-American female with end-stage renal disease on dialysis, admitted with recurrent profound anemia and heme positive stool. Patient has history of gastric and prepyloric AVMs. She just recently had EGD 05/06/2020 with finding of grade B esophagitis and antral gastric AVMs. Question GAVE.  Suspect ongoing slow GI blood loss secondary to gastric and prepyloric AVMs, rule out small bowel AVMs  2.  Congestive heart failure 3.  Adult onset diabetes mellitus 4.  Status post left AKA amputation 5.  History of hep C, treated 6.  Diverticulosis 7.  Osteoarthritis  PLAN:  Transfuse to keep hemoglobin 7.5 Have changed PPI to IV Patient will be scheduled for EGD and enteroscopy  tomorrow with Dr. Silverio Decamp. Procedures were discussed in detail with the patient including indications risks and benefits and she is agreeable to proceed. Further recommendations pending findings at EGD.   Amy Esterwood PA-C 05/27/2020, 9:53 AM      Attending Physician Note   I have taken a history, examined the patient and reviewed the chart. I agree with the Advanced Practitioner's note, impression and recommendations.  Recurrent anemia and heme positive stool likely from known gastric AVMs possible GAVE.  Transfuse to maintain Hgb > 7 ESRD on HD Tu, Th, Sat EGD, SBE tomorrow with Dr. Chriss Driver, MD [facility]

## 2020-05-27 NOTE — H&P (Addendum)
History and Physical    Brandy Houston:[id number] DOB: Nov 28, 1955 DOA: 05/26/2020  PCP: [name], MD   Patient coming from:  Dialysis  Chief Complaint: low blood count and needs blood transfusion  HPI: Brandy Houston is a 64 y.o. female with medical history significant for ESRD on HD via right AVF, CHF, and diabetes presents from dialysis due to concern for anemia.  Patient states that she had a full session of dialysis today but was sent to the hospital due to low hemoglobin. She reports having some brief lightheadedness while standing or changing position the past few days. She has not had LOC due to the lightheadedness. No chest pain/pressure, SOB, abdominal pain. She has had dark stools but attributed this to the iron she is taking daily.  She denies any hematemesis, hematochezia or other sources of known bleeding. She had an upper endoscopy at end of November 2021 and small area was cauterized at that time she states. [name] did have a fall about a week ago and has a swelling on her right leg since that time. 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  She denies any head trauma or LOC. She has been taking all her medications she states.  Denies alcohol use. Smokes 1/2 ppd.   Review of Systems:  General: Reports brief dizziness with change in position. Denies weakness, fever, chills, weight loss, night sweats. Denies change in appetite HENT: Denies head trauma, headache, denies change in hearing, tinnitus.  Denies nasal congestion or bleeding.  Denies sore throat, sores in mouth.  Denies difficulty swallowing Eyes: Denies blurry vision, pain in eye, drainage.  Denies discoloration of eyes. Neck: Denies pain.  Denies swelling.  Denies pain with movement. Cardiovascular: Denies chest pain, palpitations.  Denies edema.  Denies orthopnea Respiratory: Denies shortness of breath, cough.  Denies wheezing.  Denies sputum  production Gastrointestinal: Denies abdominal pain, swelling.  Denies nausea, vomiting, diarrhea. Denies hematemesis. Musculoskeletal: Reports right lateral hip swelling and pain since fall last week. Denies limitation of movement. Denies arthralgias or myalgias. Genitourinary: Denies pelvic pain.  Denies urinary frequency or hesitancy.  Denies dysuria.  Skin: Denies rash.  Denies petechiae, purpura, ecchymosis. Neurological: Denies headache. Denies syncope. Denies seizure activity. Denies slurred speech, drooping face. Denies visual change. Psychiatric: Denies depression, anxiety. Denies hallucinations.  Past Medical History:  Diagnosis Date  . Allergy   . Anemia   . Anxiety   . Arthritis    "in my joints" (03/10/2017)  . Chronic diastolic CHF (congestive heart failure) ([place])   . CKD (chronic kidney disease), stage IV ([place])    stage IV. previous HD, none currently 10/30/15 (confirmed 02/05/2017 & 03/10/2017)  . Depression    Chronic  . Diabetic peripheral neuropathy ([place])   . Dysrhythmia    tachycardia, normal ECHO 08-09-14  . GERD (gastroesophageal reflux disease)   . Heart murmur     dx'd 02/05/2017  . Hepatitis C    "tx'd in 2016; I'm negative now" (02/05/2017)  . History of blood transfusion 2017   "w/knee replacement"  . Hyperlipidemia   . Hypertension   . Osteoarthritis of left knee   . Peripheral neuropathy   . Protein calorie malnutrition ([place])   . Septic arthritis ([place])   . Slow transit constipation   . Symptomatic anemia 01/27/2020  . Type II diabetes mellitus ([name])    IDDM  . Uncontrolled hypertension 02/18/2013  . Unsteady  gait     Past Surgical History:  Procedure Laterality Date  . A/V FISTULAGRAM Right 07/07/2018   Procedure: A/V FISTULAGRAM;  Surgeon: Marty Heck, MD;  Location: [facility];  Service: Cardiovascular;  Laterality: Right;  . AMPUTATION Left 03/20/2017   Procedure: LEFT ABOVE KNEE AMPUTATION;  Surgeon: Newt Minion, MD;  Location:  [facility];  Service: Orthopedics;  Laterality: Left;  . APPENDECTOMY    . AV FISTULA PLACEMENT Left 09/13/2014   Procedure: Brachial Artery to Brachial Vein Gortex Four - Seven Stretch GRAFT INSERTION Left Forearm;  Surgeon: Mal Misty, MD;  Location: [facility];  Service: Vascular;  Laterality: Left;  . BASCILIC VEIN TRANSPOSITION Right 10/26/2017   Procedure: BASILIC VEIN TRANSPOSITION FIRST STAGE RIGHT ARM;  Surgeon: Conrad Hamm[place], MD;  Location: [facility];  Service: Vascular;  Laterality: Right;  . BASCILIC VEIN TRANSPOSITION Right 02/01/2018   Procedure: SECOND STAGE BASILIC VEIN TRANSPOSITION RIGHT UPPER EXTREMITY;  Surgeon: Marty Heck, MD;  Location: [facility];  Service: Vascular;  Laterality: Right;  . CHOLECYSTECTOMY OPEN    . COLON SURGERY    . COLONOSCOPY    . ESOPHAGOGASTRODUODENOSCOPY (EGD) WITH PROPOFOL N/A 05/14/2020   Procedure: ESOPHAGOGASTRODUODENOSCOPY (EGD) WITH PROPOFOL;  Surgeon: Mauri Pole, MD;  [facility];  Service: Endoscopy;  Laterality: N/A;  with APC  . EXCISIONAL TOTAL KNEE ARTHROPLASTY WITH ANTIBIOTIC SPACERS Left 02/06/2017   Procedure: Incisional total iknee with antibiotic spacer ;  Surgeon: Leandrew Koyanagi, MD;  Location: [facility];  Service: Orthopedics;  Laterality: Left;  . HOT HEMOSTASIS N/A 05/14/2020   Procedure: HOT HEMOSTASIS (ARGON PLASMA COAGULATION/BICAP);  Surgeon: Mauri Pole, MD;  Location: [facility];  Service: Endoscopy;  Laterality: N/A;  . INTRAVASCULAR PRESSURE WIRE/FFR STUDY N/A 04/13/2020   Procedure: INTRAVASCULAR PRESSURE WIRE/FFR STUDY;  Surgeon: Burnell Blanks, MD;  Location: [facility];  Service: Cardiovascular;  Laterality: N/A;  . IR FLUORO GUIDE CV LINE RIGHT  02/10/2017  . IR REMOVAL TUN CV CATH W/O FL  04/01/2017  . IR US GUIDE VASC ACCESS RIGHT  02/10/2017  . IRRIGATION AND DEBRIDEMENT KNEE Left 03/12/2017   Procedure: IRRIGATION AND DEBRIDEMENT LEFT KNEE WITH WOUND VAC APPLICATION;  Surgeon: Leandrew Koyanagi, MD;  Location: [facility];  Service: Orthopedics;  Laterality: Left;  . JOINT REPLACEMENT    . KNEE ARTHROSCOPY Right 08/10/2014   Procedure: ARTHROSCOPY I & D KNEE;  Surgeon: Marianna Payment, MD;  [facility];  Service: Orthopedics;  Laterality: Right;  . KNEE ARTHROSCOPY Left 08/11/2014   Procedure: ARTHROSCOPIC WASHOUT LEFT KNEE;  Surgeon: Marianna Payment, MD;  Location: [facility];  Service: Orthopedics;  Laterality: Left;  . KNEE ARTHROSCOPY Left 08/19/2014   Procedure: ARTHROSCOPIC WASHOUT LEFT KNEE;  Surgeon: Leandrew Koyanagi, MD;  Location: [facility];  Service: Orthopedics;  Laterality: Left;  . KNEE ARTHROSCOPY WITH LATERAL MENISECTOMY Left 04/04/2015   Procedure: AND PARTIAL LATERAL MENISECTOMY;  Surgeon: Leandrew Koyanagi, MD;  Location: [facility];  Service: Orthopedics;  Laterality: Left;  . KNEE ARTHROSCOPY WITH MEDIAL MENISECTOMY Left 04/04/2015   Procedure: LEFT KNEE ARTHROSCOPY WITH PARTIAL MEDIAL MENISCECTOMY  AND SYNOVECTOMY;  Surgeon: Leandrew Koyanagi, MD;  Location: [facility];  Service: Orthopedics;  Laterality: Left;  . PERIPHERAL VASCULAR BALLOON ANGIOPLASTY  07/07/2018   Procedure: PERIPHERAL VASCULAR BALLOON ANGIOPLASTY;  Surgeon: Marty Heck, MD;  Location: [facility];  Service: Cardiovascular;;  right AV fistula  . POLYPECTOMY    . RIGHT/LEFT HEART CATH AND CORONARY ANGIOGRAPHY N/A 04/13/2020   Procedure: RIGHT/LEFT HEART CATH AND CORONARY ANGIOGRAPHY;  Surgeon: Burnell Blanks, MD;  Location: [facility];  Service: Cardiovascular;  Laterality: N/A;  . SHOULDER ARTHROSCOPY Bilateral 08/10/2014   Procedure: I & D BILATERAL SHOULDERS ;  Surgeon: Marianna Payment, MD;  [facility];  Service: Orthopedics;  Laterality: Bilateral;  . SMALL INTESTINE SURGERY     Due to Small Bowel Obstruction; "fixed it when they did my gallbladder OR"  . TEE WITHOUT CARDIOVERSION N/A 08/14/2014   Procedure: TRANSESOPHAGEAL  ECHOCARDIOGRAM (TEE);  Surgeon: Thayer Headings, MD;  Location: [facility];  Service: Cardiovascular;  Laterality: N/A;  . TENOSYNOVECTOMY Right 08/11/2014   Procedure: RIGHT WRIST IRRIGATION AND DEBRIDEMENT, TENOSYNOVECTOMY;  Surgeon: Marianna Payment, MD;  Location: [facility];  Service: Orthopedics;  Laterality: Right;  . TOTAL KNEE ARTHROPLASTY Left 11/07/2015   Procedure: LEFT TOTAL KNEE ARTHROPLASTY WITH REVISION OF IMPLANTS;  Surgeon: Leandrew Koyanagi, MD;  Location: [facility];  Service: Orthopedics;  Laterality: Left;  . TUBAL LIGATION      Social History  reports that she has been smoking cigarettes. She has a 17.50 pack-year smoking history. She has never used smokeless tobacco. She reports previous alcohol use. She reports that she does not use drugs.  Allergies  Allergen Reactions  . Compazine [Prochlorperazine] Shortness Of Breath, Swelling and Other (See Comments)    TONGUE SWELLS  . Shellfish-Derived Products Anaphylaxis  . Iodinated Diagnostic Agents Hives and Rash  . Omnipaque [Iohexol] Hives  . Tape     Brown paper tape caused skin irritation  . Sulfa Antibiotics Rash    Family History  Problem Relation Age of Onset  . Uterine cancer Mother   . Heart disease Father   . Hypertension Sister   . Uterine cancer Sister   . Cancer Brother        unsure type  . Diabetes Brother   . Hypertension Brother   . Colon cancer Neg Hx   . Colon polyps Neg Hx   . Esophageal cancer Neg Hx   . Rectal cancer Neg Hx   . Stomach cancer Neg Hx      Prior to Admission medications   Medication Sig Start Date End Date Taking? Authorizing Provider  Accu-Chek Softclix Lancets lancets Check sugars three times a day for E11.65 09/20/18  Yes [name], MD  acetaminophen (TYLENOL) 500 MG tablet Take 500-1,000 mg by mouth every 6 (six) hours as needed (for pain.).   Yes [provider]  aspirin EC 81 MG tablet Take 81 mg by mouth daily.   Yes [provider]  atorvastatin  (LIPITOR) 10 MG tablet TAKE 1 TABLET BY MOUTH AT BEDTIME . APPOINTMENT REQUIRED FOR FUTURE REFILLS Patient taking differently: Take 10 mg by mouth at bedtime. 10/31/19  Yes [name], MD  blood glucose meter kit and supplies KIT Dispense based on patient and insurance preference. Use up to four times daily as directed 10/13/19  Yes [name], MD  Blood Glucose Monitoring Suppl (ACCU-CHEK GUIDE) w/Device KIT 1 Device by Does not apply route daily. 10/31/19  Yes [name], MD  calcium acetate (PHOSLO) 667 MG capsule Take 1,334-2,001 mg by mouth See admin instructions. Take 3 capsules (2001 mg) by mouth with breakfast, take 2 capsules (1334 mg) by mouth with lunch, take 3 capsules (2001 mg) by mouth  with supper & take 2 capsules (1334 mg) by mouth with snacks. 01/13/19  Yes [provider]  carvedilol (COREG) 3.125 MG tablet Take 1 tablet (3.125 mg total) by mouth 2 (two) times daily. 04/04/20 07/03/20 Yes Donato Heinz, MD  cloNIDine (CATAPRES) 0.1 MG tablet Take 0.1 mg by mouth See admin instructions. Bid on non-dialysis days( m,w,f,sun) 03/29/20  Yes [provider]  diphenhydrAMINE (BENADRYL) 25 mg capsule Take 25 mg by mouth every 6 (six) hours as needed for allergies.   Yes [provider]  gabapentin (NEURONTIN) 100 MG capsule Take 200 mg by mouth See admin instructions. Take 2 capsules (200 mg) by mouth 3 times on Sundays, Mondays, Wednesdays, & Fridays. 11/01/18  Yes [provider]  gabapentin (NEURONTIN) 300 MG capsule Take 300 mg by mouth See admin instructions. Take 1 capsule (300 mg) by mouth twice daily on Tuesdays, Thursdays, & Saturdays.   Yes [provider]  glucose blood (ACCU-CHEK GUIDE) test strip Check blood sugar up to 4 times a day 10/20/19  Yes [name], MD  hydrALAZINE (APRESOLINE) 100 MG tablet Take 100 mg by mouth See admin instructions. Bid on non-dialysis days.( m,w,f,sun)   Yes [provider]   isosorbide mononitrate (IMDUR) 30 MG 24 hr tablet Take 1 tablet (30 mg total) by mouth daily. 04/16/20 07/15/20 Yes Cheryln Manly, NP  Lancet Devices [facility]) lancets Check sugars TID for E11.65 01/29/15  Yes Chari Manning A, NP  lidocaine-prilocaine (EMLA) cream Apply 1 application topically Every Tuesday,Thursday,and Saturday with dialysis. On days of dialysis 3x week 10/25/18  Yes [provider]  methocarbamol (ROBAXIN) 500 MG tablet Take 500 mg by mouth every 8 (eight) hours as needed for muscle spasms.  06/08/19  Yes [provider]  Methoxy PEG-Epoetin Beta (MIRCERA IJ) Get it at dialysis 06/30/19 06/28/20 Yes [provider]  multivitamin (RENA-VIT) TABS tablet Take 1 tablet by mouth daily. 03/01/19  Yes [provider]  nitroGLYCERIN (NITROSTAT) 0.4 MG SL tablet Place 1 tablet (0.4 mg total) under the tongue every 5 (five) minutes as needed. 04/25/20 07/24/20 Yes Donato Heinz, MD  pantoprazole (PROTONIX) 20 MG tablet Take 1 tablet (20 mg total) by mouth 2 (two) times daily before a meal. 05/21/20  Yes Nandigam, Venia Minks, MD  polyethylene glycol (MIRALAX / GLYCOLAX) 17 g packet Take 17 g by mouth daily as needed for moderate constipation. 12/10/18  Yes [name], MD  sertraline (ZOLOFT) 100 MG tablet Take 1 tablet by mouth once daily Patient taking differently: Take 100 mg by mouth every evening. 03/26/20  Yes [name], MD  traZODone (DESYREL) 50 MG tablet Take 50 mg by mouth at bedtime as needed for sleep.  10/27/18  Yes [provider]    Physical Exam: Vitals:   05/26/20 2330 05/26/20 2343 05/27/20 0036 05/27/20 0050  BP: (!) 135/59 123/87 (!) 129/57 (!) 129/57  Pulse:  80 78 79  Resp: _0 Temp:  98.5 F (36.9 C)    TempSrc:  Oral    SpO2:  98%  97%  Weight:      Height:        Constitutional: NAD, calm, comfortable Vitals:   05/26/20 2330 05/26/20 2343 05/27/20 0036 05/27/20 0050  BP: (!)  135/59 123/87 (!) 129/57 (!) 129/57  Pulse:  80 78 79  Resp: _1 Temp:  98.5 F (36.9 C)    TempSrc:  Oral  SpO2:  98%  97%  Weight:      Height:       General: WDWN, Alert and oriented x3.  Eyes: EOMI, PERRL, conjunctivae pale pink,  Sclera nonicteric HENT:  [place]/AT, external ears normal.  Nares patent without epistasis.  Mucous membranes are pale, moist. Posterior pharynx clear of any exudate or lesions.  Neck: Soft, normal range of motion, supple, no masses, no thyromegaly.  Trachea midline Respiratory: clear to auscultation bilaterally, no wheezing, no crackles. Normal respiratory effort. No accessory muscle use.  Cardiovascular: Regular rate and rhythm, +4 systolic murmurs. No rubs / gallops. No extremity edema. 2+ pedal pulses.  Abdomen: Soft, no tenderness, nondistended, no rebound or guarding.  No masses palpated. Bowel sounds normoactive Musculoskeletal:   Lateral right hip with swelling with central fluctuance. Tender to palpation. No erythema. Has clubbing of digits. No cyanosis. Normal muscle tone. Left AKA.  Skin: Warm, dry, intact no rashes, lesions, ulcers. No induration Neurologic: CN 2-12 grossly intact.  Normal speech.  Sensation intact Psychiatric: Normal judgment and insight.  Normal mood.    Labs on Admission: I have personally reviewed following labs and imaging studies  CBC: Recent Labs  Lab 05/26/20 1708  WBC 5.4  HGB 6.1*  HCT 19.5*  MCV 98.0  PLT 245    Basic Metabolic Panel: Recent Labs  Lab 05/26/20 1708  NA 137  K 3.1*  CL 93*  CO2 34*  GLUCOSE 130*  BUN 13  CREATININE 2.19*  CALCIUM 7.9*    GFR: Estimated Creatinine Clearance: 23.9 mL/min (A) (by C-G formula based on SCr of 2.19 mg/dL (H)).  Liver Function Tests: Recent Labs  Lab 05/26/20 1708  AST 12*  ALT 19  ALKPHOS 69  BILITOT 0.6  PROT 6.0*  ALBUMIN 2.9*    Urine analysis:    Component Value Date/Time   COLORURINE AMBER (A) 02/08/2017 1138   APPEARANCEUR  TURBID (A) 02/08/2017 1138   LABSPEC 1.014 02/08/2017 1138   PHURINE 5.0 02/08/2017 1138   GLUCOSEU NEGATIVE 02/08/2017 1138   HGBUR MODERATE (A) 02/08/2017 1138   BILIRUBINUR NEGATIVE 02/08/2017 1138   KETONESUR NEGATIVE 02/08/2017 1138   PROTEINUR 100 (A) 02/08/2017 1138   UROBILINOGEN 0.2 09/02/2014 1504   NITRITE NEGATIVE 02/08/2017 1138   LEUKOCYTESUR LARGE (A) 02/08/2017 1138    Radiological Exams on Admission: No results found.   Assessment/Plan Principal Problem:   Anemia in chronic kidney disease Ms. Canion admitted to [place] floor. Been ordered a unit of PRBC to be transfused the emergency room which is already been started.  We will recheck hemoglobin hematocrit after transfusion and if not above 8 will transfuse another unit. Patient with history of GI bleed and had upper endoscopy on May 14, 2000 with low bowel gastroenterology.  Will consult gastroenterology to patient in the morning.  Active Problems:   End stage renal disease [facility])  Patient had her usual dialysis session on May 26, 2020.  Patient will need consultation with nephrology for dialysis if she is still here on Tuesday which is her regular dialysis day    Chronic diastolic CHF (congestive heart failure) ([name]) Stable chronic diastolic CHF.  Monitor fluid status    Benign essential HTN Monitor blood pressure.  Continue home medications of Coreg, clonidine, Imdur, hydralazine    Hip swelling, right Patient had a fall last week and has had swelling of her right lateral hip since then.  RI of hip has been ordered by the emergency room provider to dilate  for hematoma versus abscess.  Patient has normal white count    Hypokalemia Replete potassium with oral K. Dur tonight.  Electrolytes and renal function morning     Chronic tobacco use Nicotine patch provided to prevent nicotine withdrawal in the hospital.     DVT prophylaxis: SCD provided with possible GI bleed and anemia.   Code Status:    Full code  Family Communication:  Diagnosis and plan discussed with patient who verbalizes understanding and agrees with plan.  Further recommendations to follow as clinically indicated. Disposition Plan:   Patient is from:  Home  Anticipated DC to:  Home  Anticipated DC date:  Anticipate at least 2 midnight stay in the hospital to treat acute condition  Anticipated DC barriers: No barriers to discharge identified at this time  Consults called:  Gastroenterology, Dr Fuller Plan sent a secure chat to notify to see in am.  Admission status:  Inpatient   Yevonne Aline Maximo Spratling MD [facility]  How to contact the [facility] Attending or Consulting provider [place] or covering provider during after hours [place], for this patient?   1. Check the care team in [facility] and look for a) attending/consulting [facility] provider listed and b) the [facility] team listed 2. Log into [email or web address] and use [place]'s universal password to access. If you do not have the password, please contact the hospital operator. 3. Locate the [facility] provider you are looking for under [facility] and page to a number that you can be directly reached. 4. If you still have difficulty reaching the provider, please page the [facility] (Director on Call) for the Hospitalists listed on amion for assistance.  05/27/2020, 1:18 AM

## 2020-05-27 NOTE — ED Notes (Signed)
Called MRI to alert that pt is ready. Blood products completed.

## 2020-05-27 NOTE — H&P (View-Only) (Signed)
Consultation  Referring Provider:  [facility]/ Pahwani Primary Care Physician:  [name], MD Primary Gastroenterologist:  Dr. Silverio Decamp   Reason for Consultation:   Recurrent anemia, heme+ stool  HPI: Brandy Houston is a 64 y.o. female, known to the GI service, who we are asked to see today for recurrent profound anemia and heme positive stool. Patient has history of end-stage renal disease, on hemodialysis, and post dialysis on Thursday, 05/24/2020 was noted to have hemoglobin of 6.  She was directed to the emergency room where she was admitted yesterday. Patient has history of chronic anemia, congestive heart failure, adult onset diabetes mellitus, she is status post left AKA amputation, has history of congestive heart failure, and prior hep C which was treated. She is being transfused 2 units of packed RBCs, hemoglobin at 3 AM today 6.9.  She denies any abdominal pain or discomfort, no nausea or vomiting.  She is on oral iron and says her stools stay dark, she has not been aware of any overt melena or hematochezia.  She denies any aspirin or NSAID use.  She has been on Protonix 20 mg twice daily at home.  Patient just had EGD in November 2021 for anemia and heme positive stool.  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 She also had colonoscopy and EGD in August 2021.  At colonoscopy she was noted to have scattered pandiverticulosis and internal hemorrhoids.  At EGD had mild esophagitis, and gastric AVMs as well as prepyloric AVMs raising question of GA VE.[name] She was also noted to have 5 mm erosions in the stomach scattered.  Biopsy showed mild chronic gastritis, duodenal biopsies were negative.   Past Medical History:  Diagnosis Date   Allergy    Anemia    Anxiety    Arthritis    "in my joints" (03/10/2017)   Chronic diastolic CHF (congestive heart failure) ([name])    CKD (chronic kidney disease), stage IV ([name])    stage IV.  previous HD, none currently 10/30/15 (confirmed 02/05/2017 & 03/10/2017)   Depression    Chronic   Diabetic peripheral neuropathy ([place])    Dysrhythmia    tachycardia, normal ECHO 08-09-14   GERD (gastroesophageal reflux disease)    Heart murmur     dx'd 02/05/2017   Hepatitis C    "tx'd in 2016; I'm negative now" (02/05/2017)   History of blood transfusion 2017   "w/knee replacement"   Hyperlipidemia    Hypertension    Osteoarthritis of left knee    Peripheral neuropathy    Protein calorie malnutrition ([name])    Septic arthritis ([name])    Slow transit constipation    Symptomatic anemia 01/27/2020   Type II diabetes mellitus ([place])    IDDM   Uncontrolled hypertension 02/18/2013   Unsteady gait     Past Surgical History:  Procedure Laterality Date   A/V FISTULAGRAM Right 07/07/2018   Procedure: A/V FISTULAGRAM;  Surgeon: Marty Heck, MD;  Location: [place] [facility];  Service: Cardiovascular;  Laterality: Right;   AMPUTATION Left 03/20/2017   Procedure: LEFT ABOVE KNEE AMPUTATION;  Surgeon: Newt Minion, MD;  Location: [place];  Service: Orthopedics;  Laterality: Left;   APPENDECTOMY     AV FISTULA PLACEMENT Left 09/13/2014   Procedure: Brachial Artery to Brachial Vein Gortex Four - Seven Stretch GRAFT INSERTION Left Forearm;  Surgeon: Mal Misty, MD;  Location: [place];  Service:  Vascular;  Laterality: Left;   BASCILIC VEIN TRANSPOSITION Right 10/26/2017   Procedure: BASILIC VEIN TRANSPOSITION FIRST STAGE RIGHT ARM;  Surgeon: Conrad Tay[place], MD;  Location: [place];  Service: Vascular;  Laterality: Right;   [place] Right 02/01/2018   Procedure: SECOND STAGE BASILIC VEIN TRANSPOSITION RIGHT UPPER EXTREMITY;  Surgeon: Marty Heck, MD;  Location: [place];  Service: Vascular;  Laterality: Right;   CHOLECYSTECTOMY OPEN     COLON SURGERY     COLONOSCOPY     ESOPHAGOGASTRODUODENOSCOPY (EGD) WITH PROPOFOL N/A 05/14/2020   Procedure:  ESOPHAGOGASTRODUODENOSCOPY (EGD) WITH PROPOFOL;  Surgeon: Mauri Pole, MD;  [facility]: [facility];  Service: Endoscopy;  Laterality: N/A;  with APC   EXCISIONAL TOTAL KNEE ARTHROPLASTY WITH ANTIBIOTIC SPACERS Left 02/06/2017   Procedure: Incisional total iknee with antibiotic spacer ;  Surgeon: Leandrew Koyanagi, MD;  Location: [place];  Service: Orthopedics;  Laterality: Left;   HOT HEMOSTASIS N/A 05/14/2020   Procedure: HOT HEMOSTASIS (ARGON PLASMA COAGULATION/BICAP);  Surgeon: Mauri Pole, MD;  Location: [facility];  Service: Endoscopy;  Laterality: N/A;   INTRAVASCULAR PRESSURE WIRE/FFR STUDY N/A 04/13/2020   Procedure: INTRAVASCULAR PRESSURE WIRE/FFR STUDY;  Surgeon: Burnell Blanks, MD;  Location: [place] [facility];  Service: Cardiovascular;  Laterality: N/A;   IR FLUORO GUIDE CV LINE RIGHT  02/10/2017   IR REMOVAL TUN CV CATH W/O FL  04/01/2017   IR US GUIDE VASC ACCESS RIGHT  02/10/2017   IRRIGATION AND DEBRIDEMENT KNEE Left 03/12/2017   Procedure: IRRIGATION AND DEBRIDEMENT LEFT KNEE WITH WOUND VAC APPLICATION;  Surgeon: Leandrew Koyanagi, MD;  Location: [place];  Service: Orthopedics;  Laterality: Left;   JOINT REPLACEMENT     KNEE ARTHROSCOPY Right 08/10/2014   Procedure: ARTHROSCOPY I & D KNEE;  Surgeon: Marianna Payment, MD;  [facility]: [facility];  Service: Orthopedics;  Laterality: Right;   KNEE ARTHROSCOPY Left 08/11/2014   Procedure: ARTHROSCOPIC WASHOUT LEFT KNEE;  Surgeon: Marianna Payment, MD;  Location: [place];  Service: Orthopedics;  Laterality: Left;   KNEE ARTHROSCOPY Left 08/19/2014   Procedure: ARTHROSCOPIC WASHOUT LEFT KNEE;  Surgeon: Leandrew Koyanagi, MD;  Location: [place];  Service: Orthopedics;  Laterality: Left;   KNEE ARTHROSCOPY WITH LATERAL MENISECTOMY Left 04/04/2015   Procedure: AND PARTIAL LATERAL MENISECTOMY;  Surgeon: Leandrew Koyanagi, MD;  Location: [place];  Service: Orthopedics;  Laterality: Left;   KNEE ARTHROSCOPY WITH  MEDIAL MENISECTOMY Left 04/04/2015   Procedure: LEFT KNEE ARTHROSCOPY WITH PARTIAL MEDIAL MENISCECTOMY  AND SYNOVECTOMY;  Surgeon: Leandrew Koyanagi, MD;  Location: [place];  Service: Orthopedics;  Laterality: Left;   PERIPHERAL VASCULAR BALLOON ANGIOPLASTY  07/07/2018   Procedure: PERIPHERAL VASCULAR BALLOON ANGIOPLASTY;  Surgeon: Marty Heck, MD;  Location: [place] [facility];  Service: Cardiovascular;;  right AV fistula   POLYPECTOMY     RIGHT/LEFT HEART CATH AND CORONARY ANGIOGRAPHY N/A 04/13/2020   Procedure: RIGHT/LEFT HEART CATH AND CORONARY ANGIOGRAPHY;  Surgeon: Burnell Blanks, MD;  Location: [place] [facility];  Service: Cardiovascular;  Laterality: N/A;   SHOULDER ARTHROSCOPY Bilateral 08/10/2014   Procedure: I & D BILATERAL SHOULDERS ;  Surgeon: Marianna Payment, MD;  [facility];  Service: Orthopedics;  Laterality: Bilateral;   SMALL INTESTINE SURGERY     Due to Small Bowel Obstruction; "fixed it when they did my gallbladder OR"   TEE WITHOUT CARDIOVERSION N/A 08/14/2014   Procedure: TRANSESOPHAGEAL ECHOCARDIOGRAM (  TEE);  Surgeon: Thayer Headings, MD;  Location: [facility];  Service: Cardiovascular;  Laterality: N/A;   TENOSYNOVECTOMY Right 08/11/2014   Procedure: RIGHT WRIST IRRIGATION AND DEBRIDEMENT, TENOSYNOVECTOMY;  Surgeon: Marianna Payment, MD;  Location: [place];  Service: Orthopedics;  Laterality: Right;   TOTAL KNEE ARTHROPLASTY Left 11/07/2015   Procedure: LEFT TOTAL KNEE ARTHROPLASTY WITH REVISION OF IMPLANTS;  Surgeon: Leandrew Koyanagi, MD;  Location: [place];  Service: Orthopedics;  Laterality: Left;   TUBAL LIGATION      Prior to Admission medications   Medication Sig Start Date End Date Taking? Authorizing Provider  Accu-Chek Softclix Lancets lancets Check sugars three times a day for E11.65 09/20/18  Yes [name], MD  acetaminophen (TYLENOL) 500 MG tablet Take 500-1,000 mg by mouth every 6 (six) hours as needed (for  pain.).   Yes [provider]  aspirin EC 81 MG tablet Take 81 mg by mouth daily.   Yes [provider]  atorvastatin (LIPITOR) 10 MG tablet TAKE 1 TABLET BY MOUTH AT BEDTIME . APPOINTMENT REQUIRED FOR FUTURE REFILLS Patient taking differently: Take 10 mg by mouth at bedtime. 10/31/19  Yes [name], MD  blood glucose meter kit and supplies KIT Dispense based on patient and insurance preference. Use up to four times daily as directed 10/13/19  Yes [name], MD  Blood Glucose Monitoring Suppl (ACCU-CHEK GUIDE) w/Device KIT 1 Device by Does not apply route daily. 10/31/19  Yes [name], MD  calcium acetate (PHOSLO) 667 MG capsule Take 1,334-2,001 mg by mouth See admin instructions. Take 3 capsules (2001 mg) by mouth with breakfast, take 2 capsules (1334 mg) by mouth with lunch, take 3 capsules (2001 mg) by mouth with supper & take 2 capsules (1334 mg) by mouth with snacks. 01/13/19  Yes [provider]  carvedilol (COREG) 3.125 MG tablet Take 1 tablet (3.125 mg total) by mouth 2 (two) times daily. 04/04/20 07/03/20 Yes Donato Heinz, MD  cloNIDine (CATAPRES) 0.1 MG tablet Take 0.1 mg by mouth See admin instructions. Bid on non-dialysis days( m,w,f,sun) 03/29/20  Yes [provider]  diphenhydrAMINE (BENADRYL) 25 mg capsule Take 25 mg by mouth every 6 (six) hours as needed for allergies.   Yes [provider]  gabapentin (NEURONTIN) 100 MG capsule Take 200 mg by mouth See admin instructions. Take 2 capsules (200 mg) by mouth 3 times on Sundays, Mondays, Wednesdays, & Fridays. 11/01/18  Yes [provider]  gabapentin (NEURONTIN) 300 MG capsule Take 300 mg by mouth See admin instructions. Take 1 capsule (300 mg) by mouth twice daily on Tuesdays, Thursdays, & Saturdays.   Yes [provider]  glucose blood (ACCU-CHEK GUIDE) test strip Check blood sugar up to 4 times a day 10/20/19  Yes [name], MD  hydrALAZINE  (APRESOLINE) 100 MG tablet Take 100 mg by mouth See admin instructions. Bid on non-dialysis days.( m,w,f,sun)   Yes [provider]  isosorbide mononitrate (IMDUR) 30 MG 24 hr tablet Take 1 tablet (30 mg total) by mouth daily. 04/16/20 07/15/20 Yes Cheryln Manly, NP  Lancet Devices [facility]) lancets Check sugars TID for E11.65 01/29/15  Yes Chari Manning A, NP  lidocaine-prilocaine (EMLA) cream Apply 1 application topically Every Tuesday,Thursday,and Saturday with dialysis. On days of dialysis 3x week 10/25/18  Yes [provider]  methocarbamol (ROBAXIN) 500 MG tablet Take 500 mg by mouth every 8 (eight) hours as needed for muscle spasms.  06/08/19  Yes [provider]  Methoxy PEG-Epoetin Beta (MIRCERA IJ) Get it at dialysis 06/30/19 06/28/20 Yes [provider]  multivitamin (RENA-VIT) TABS tablet Take 1 tablet by mouth daily. 03/01/19  Yes [provider]  nitroGLYCERIN (NITROSTAT) 0.4 MG SL tablet Place 1 tablet (0.4 mg total) under the tongue every 5 (five) minutes as needed. 04/25/20 07/24/20 Yes Donato Heinz, MD  pantoprazole (PROTONIX) 20 MG tablet Take 1 tablet (20 mg total) by mouth 2 (two) times daily before a meal. 05/21/20  Yes Nandigam, Venia Minks, MD  polyethylene glycol (MIRALAX / GLYCOLAX) 17 g packet Take 17 g by mouth daily as needed for moderate constipation. 12/10/18  Yes [name], MD  sertraline (ZOLOFT) 100 MG tablet Take 1 tablet by mouth once daily Patient taking differently: Take 100 mg by mouth every evening. 03/26/20  Yes [name], MD  traZODone (DESYREL) 50 MG tablet Take 50 mg by mouth at bedtime as needed for sleep.  10/27/18  Yes [provider]    Current Facility-Administered Medications  Medication Dose Route Frequency Provider Last Rate Last Admin   0.9 %  sodium chloride infusion (Manually program via Guardrails IV Fluids)   Intravenous Once Hendley, Cornelia, MD       0.9 %   sodium chloride infusion (Manually program via Guardrails IV Fluids)   Intravenous Once Pahwani, Rinka R, MD       acetaminophen (TYLENOL) tablet 650 mg  650 mg Oral Q6H PRN Chotiner, Yevonne Aline, MD   650 mg at 05/27/20 0242   Or   acetaminophen (TYLENOL) suppository 650 mg  650 mg Rectal Q6H PRN Chotiner, Yevonne Aline, MD       atorvastatin (LIPITOR) tablet 10 mg  10 mg Oral QHS Chotiner, Yevonne Aline, MD   10 mg at 05/27/20 0242   calcium acetate (PHOSLO) capsule 1,334 mg  1,334 mg Oral Q lunch Chotiner, Yevonne Aline, MD       calcium acetate (PHOSLO) capsule 1,334 mg  1,334 mg Oral PRN Chotiner, Yevonne Aline, MD       calcium acetate (PHOSLO) capsule 2,001 mg  2,001 mg Oral BID WC Chotiner, Yevonne Aline, MD   2,001 mg at 05/27/20 0814   carvedilol (COREG) tablet 3.125 mg  3.125 mg Oral BID Chotiner, Yevonne Aline, MD   3.125 mg at 05/27/20 0242   cloNIDine (CATAPRES) tablet 0.1 mg  0.1 mg Oral 2 times per day on Sun Mon Wed Fri Chotiner, Yevonne Aline, MD       gabapentin (NEURONTIN) capsule 200 mg  200 mg Oral 3 times per day on Sun Mon Wed Fri Chotiner, Yevonne Aline, MD       [START ON 05/29/2020] gabapentin (NEURONTIN) capsule 300 mg  300 mg Oral 2 times per day on Tue Thu Sat Chotiner, Yevonne Aline, MD       hydrALAZINE (APRESOLINE) tablet 100 mg  100 mg Oral 2 times per day on Sun Mon Wed Fri Chotiner, Yevonne Aline, MD       HYDROmorphone (DILAUDID) injection 0.5 mg  0.5 mg Intravenous Q4H PRN Pahwani, Rinka R, MD       isosorbide mononitrate (IMDUR) 24 hr tablet 30 mg  30 mg Oral Daily Chotiner, Yevonne Aline, MD       nicotine (NICODERM CQ - dosed in mg/24 hr) patch 7 mg  7 mg Transdermal Daily Chotiner, Yevonne Aline, MD       pantoprazole (PROTONIX) EC tablet 20 mg  20 mg  Oral BID AC Chotiner, Yevonne Aline, MD   20 mg at 05/27/20 0813   polyethylene glycol (MIRALAX / GLYCOLAX) packet 17 g  17 g Oral Daily PRN Chotiner, Yevonne Aline, MD       sertraline (ZOLOFT) tablet 100 mg  100 mg Oral QPM Chotiner, Yevonne Aline, MD        traZODone (DESYREL) tablet 50 mg  50 mg Oral QHS PRN Chotiner, Yevonne Aline, MD       Current Outpatient Medications  Medication Sig Dispense Refill   Accu-Chek Softclix Lancets lancets Check sugars three times a day for E11.65 100 each 12   acetaminophen (TYLENOL) 500 MG tablet Take 500-1,000 mg by mouth every 6 (six) hours as needed (for pain.).     aspirin EC 81 MG tablet Take 81 mg by mouth daily.     atorvastatin (LIPITOR) 10 MG tablet TAKE 1 TABLET BY MOUTH AT BEDTIME . APPOINTMENT REQUIRED FOR FUTURE REFILLS (Patient taking differently: Take 10 mg by mouth at bedtime.) 90 tablet 3   blood glucose meter kit and supplies KIT Dispense based on patient and insurance preference. Use up to four times daily as directed 1 each 0   Blood Glucose Monitoring Suppl (ACCU-CHEK GUIDE) w/Device KIT 1 Device by Does not apply route daily. 1 kit 3   calcium acetate (PHOSLO) 667 MG capsule Take 1,334-2,001 mg by mouth See admin instructions. Take 3 capsules (2001 mg) by mouth with breakfast, take 2 capsules (1334 mg) by mouth with lunch, take 3 capsules (2001 mg) by mouth with supper & take 2 capsules (1334 mg) by mouth with snacks.     carvedilol (COREG) 3.125 MG tablet Take 1 tablet (3.125 mg total) by mouth 2 (two) times daily. 180 tablet 3   cloNIDine (CATAPRES) 0.1 MG tablet Take 0.1 mg by mouth See admin instructions. Bid on non-dialysis days( m,w,f,sun)     diphenhydrAMINE (BENADRYL) 25 mg capsule Take 25 mg by mouth every 6 (six) hours as needed for allergies.     gabapentin (NEURONTIN) 100 MG capsule Take 200 mg by mouth See admin instructions. Take 2 capsules (200 mg) by mouth 3 times on Sundays, Mondays, Wednesdays, & Fridays.     gabapentin (NEURONTIN) 300 MG capsule Take 300 mg by mouth See admin instructions. Take 1 capsule (300 mg) by mouth twice daily on Tuesdays, Thursdays, & Saturdays.     glucose blood (ACCU-CHEK GUIDE) test strip Check blood sugar up to 4 times a day 100  each 12   hydrALAZINE (APRESOLINE) 100 MG tablet Take 100 mg by mouth See admin instructions. Bid on non-dialysis days.( m,w,f,sun)     isosorbide mononitrate (IMDUR) 30 MG 24 hr tablet Take 1 tablet (30 mg total) by mouth daily. 90 tablet 0   Lancet Devices (ACCU-CHEK SOFTCLIX) lancets Check sugars TID for E11.65 1 each 10   lidocaine-prilocaine (EMLA) cream Apply 1 application topically Every Tuesday,Thursday,and Saturday with dialysis. On days of dialysis 3x week     methocarbamol (ROBAXIN) 500 MG tablet Take 500 mg by mouth every 8 (eight) hours as needed for muscle spasms.      Methoxy PEG-Epoetin Beta (MIRCERA IJ) Get it at dialysis     multivitamin (RENA-VIT) TABS tablet Take 1 tablet by mouth daily.     nitroGLYCERIN (NITROSTAT) 0.4 MG SL tablet Place 1 tablet (0.4 mg total) under the tongue every 5 (five) minutes as needed. 25 tablet 3   pantoprazole (PROTONIX) 20 MG tablet Take 1 tablet (20 mg  total) by mouth 2 (two) times daily before a meal. 60 tablet 11   polyethylene glycol (MIRALAX / GLYCOLAX) 17 g packet Take 17 g by mouth daily as needed for moderate constipation.     sertraline (ZOLOFT) 100 MG tablet Take 1 tablet by mouth once daily (Patient taking differently: Take 100 mg by mouth every evening.) 90 tablet 3   traZODone (DESYREL) 50 MG tablet Take 50 mg by mouth at bedtime as needed for sleep.       Allergies as of 05/26/2020 - Review Complete 05/25/2020  Allergen Reaction Noted   Compazine [prochlorperazine] Shortness Of Breath, Swelling, and Other (See Comments) 11/30/2012   Shellfish-derived products Anaphylaxis 11/04/2013   Iodinated diagnostic agents Hives and Rash 11/04/2013   Omnipaque [iohexol] Hives 10/18/2011   Tape  07/07/2018   Sulfa antibiotics Rash 06/20/2013    Family History  Problem Relation Age of Onset   Uterine cancer Mother    Heart disease Father    Hypertension Sister    Uterine cancer Sister    Cancer Brother         unsure type   Diabetes Brother    Hypertension Brother    Colon cancer Neg Hx    Colon polyps Neg Hx    Esophageal cancer Neg Hx    Rectal cancer Neg Hx    Stomach cancer Neg Hx     Social History   Socioeconomic History   Marital status: Single    Spouse name: Not on file   Number of children: 3   Years of education: Not on file   Highest education level: Not on file  Occupational History   Occupation: disabled  Tobacco Use   Smoking status: Current Every Day Smoker    Packs/day: 0.50    Years: 35.00    Pack years: 17.50    Types: Cigarettes   Smokeless tobacco: Never Used  Scientific laboratory technician Use: Never used  Substance and Sexual Activity   Alcohol use: Not Currently    Alcohol/week: 0.0 standard drinks    Comment: 03/10/2017 "I drink a wine cooler a few times/year"   Drug use: No   Sexual activity: Not Currently  Other Topics Concern   Not on file  Social History Narrative   Not on file   Social Determinants of Health   Financial Resource Strain: Not on file  Food Insecurity: Not on file  Transportation Needs: Not on file  Physical Activity: Not on file  Stress: Not on file  Social Connections: Not on file  Intimate Partner Violence: Not on file    Review of Systems: Pertinent positive and negative review of systems were noted in the above HPI section.  All other review of systems was otherwise negative.  Physical Exam: Vital signs in last 24 hours: Temp:  [98.2 F (36.8 C)-99 F (37.2 C)] 98.3 F (36.8 C) (12/12 0755) Pulse Rate:  [75-110] 82 (12/12 0755) Resp:  [10-21] 20 (12/12 0755) BP: (115-146)/(48-87) 133/69 (12/12 0755) SpO2:  [91 %-100 %] 100 % (12/12 0755) Weight:  [59 kg] 59 kg (12/11 1628)   General:   Alert,  Well-developed, chronically ill-appearing African-American female pleasant and cooperative in NAD Head:  Normocephalic and atraumatic. Eyes:  Sclera clear, no icterus.   Conjunctiva pale. Ears:  Normal  auditory acuity. Nose:  No deformity, discharge,  or lesions. Mouth:  No deformity or lesions.   Neck:  Supple; no masses or thyromegaly. Lungs:  Clear throughout to auscultation.  No wheezes, crackles, or rhonchi.  Heart:  Regular rate and rhythm; systolic murmur Abdomen:  Soft,nontender, BS active,nonpalp mass or hsm.   Rectal: Not done Msk:  Symmetrical without gross deformities. . Pulses:  Normal pulses noted. Extremities: Post left AKA. Neurologic:  Alert and  oriented x4;  grossly normal neurologically. Skin:  Intact without significant lesions or rashes.. Psych:  Alert and cooperative. Normal mood and affect.  Intake/Output from previous day: 12/11 0701 - 12/12 0700 In: 675 [Blood:675] Out: -  Intake/Output this shift: No intake/output data recorded.  Lab Results: Recent Labs    05/26/20 1708 05/27/20 0313  WBC 5.4 5.3  HGB 6.1* 6.9*  HCT 19.5* 22.5*  PLT 206 177   BMET Recent Labs    05/26/20 1708 05/27/20 0313  NA 137 139  K 3.1* 3.6  CL 93* 98  CO2 34* 28  GLUCOSE 130* 108*  BUN 13 24*  CREATININE 2.19* 3.15*  CALCIUM 7.9* 8.0*   LFT Recent Labs    05/26/20 1708  PROT 6.0*  ALBUMIN 2.9*  AST 12*  ALT 19  ALKPHOS 67  BILITOT 0.6     IMPRESSION:  32. 64 year old African-American female with end-stage renal disease on dialysis, admitted with recurrent profound anemia and heme positive stool. Patient has history of gastric and prepyloric AVMs. She just recently had EGD 05/06/2020 with finding of grade B esophagitis and antral gastric AVMs. Question GAVE.  Suspect ongoing slow GI blood loss secondary to gastric and prepyloric AVMs, rule out small bowel AVMs  2.  Congestive heart failure 3.  Adult onset diabetes mellitus 4.  Status post left AKA amputation 5.  History of hep C, treated 6.  Diverticulosis 7.  Osteoarthritis  PLAN:  Transfuse to keep hemoglobin 7.5 Have changed PPI to IV Patient will be scheduled for EGD and enteroscopy  tomorrow with Dr. Silverio Decamp. Procedures were discussed in detail with the patient including indications risks and benefits and she is agreeable to proceed. Further recommendations pending findings at EGD.   Amy Esterwood PA-C 05/27/2020, 9:53 AM      Attending Physician Note   I have taken a history, examined the patient and reviewed the chart. I agree with the Advanced Practitioner's note, impression and recommendations.  Recurrent anemia and heme positive stool likely from known gastric AVMs possible GAVE.  Transfuse to maintain Hgb > 7 ESRD on HD Tu, Th, Sat EGD, SBE tomorrow with Dr. Chriss Driver, MD [facility]

## 2020-05-27 NOTE — ED Notes (Signed)
Ordered Breakfast 

## 2020-05-28 ENCOUNTER — Encounter (HOSPITAL_COMMUNITY)
Admission: EM | Disposition: A | Discharge status: Home / Self Care | Payer: Self-pay | Source: Home / Self Care | Attending: Internal Medicine

## 2020-05-28 ENCOUNTER — Encounter (HOSPITAL_COMMUNITY): Payer: Self-pay | Admitting: Family Medicine

## 2020-05-28 ENCOUNTER — Inpatient Hospital Stay (HOSPITAL_COMMUNITY): Payer: Medicare Other | Admitting: Anesthesiology

## 2020-05-28 DIAGNOSIS — K5521 Angiodysplasia of colon with hemorrhage: Secondary | ICD-10-CM

## 2020-05-28 DIAGNOSIS — K31819 Angiodysplasia of stomach and duodenum without bleeding: Secondary | ICD-10-CM

## 2020-05-28 DIAGNOSIS — K5520 Angiodysplasia of colon without hemorrhage: Secondary | ICD-10-CM

## 2020-05-28 DIAGNOSIS — K2100 Gastro-esophageal reflux disease with esophagitis, without bleeding: Secondary | ICD-10-CM

## 2020-05-28 DIAGNOSIS — K31811 Angiodysplasia of stomach and duodenum with bleeding: Principal | ICD-10-CM

## 2020-05-28 DIAGNOSIS — B3781 Candidal esophagitis: Secondary | ICD-10-CM

## 2020-05-28 DIAGNOSIS — I10 Essential (primary) hypertension: Secondary | ICD-10-CM

## 2020-05-28 HISTORY — PX: HOT HEMOSTASIS: SHX5433

## 2020-05-28 HISTORY — PX: ENTEROSCOPY: SHX5533

## 2020-05-28 LAB — CBC
HCT: 27.9 % — ABNORMAL LOW (ref 36.0–46.0)
HCT: 25.5 % — ABNORMAL LOW (ref 36.0–46.0)
Hemoglobin: 8.7 g/dL — ABNORMAL LOW (ref 12.0–15.0)
Hemoglobin: 8.2 g/dL — ABNORMAL LOW (ref 12.0–15.0)
MCH: 29.5 pg (ref 26.0–34.0)
MCH: 30.1 pg (ref 26.0–34.0)
MCHC: 31.2 g/dL (ref 30.0–36.0)
MCHC: 32.2 g/dL (ref 30.0–36.0)
MCV: 94.6 fL (ref 80.0–100.0)
MCV: 93.8 fL (ref 80.0–100.0)
Platelets: 175 10*3/uL (ref 150–400)
Platelets: 181 10*3/uL (ref 150–400)
RBC: 2.95 MIL/uL — ABNORMAL LOW (ref 3.87–5.11)
RBC: 2.72 MIL/uL — ABNORMAL LOW (ref 3.87–5.11)
RDW: 20.7 % — ABNORMAL HIGH (ref 11.5–15.5)
RDW: 20.5 % — ABNORMAL HIGH (ref 11.5–15.5)
WBC: 6.7 10*3/uL (ref 4.0–10.5)
WBC: 5.9 10*3/uL (ref 4.0–10.5)
nRBC: 0.7 % — ABNORMAL HIGH (ref 0.0–0.2)
nRBC: 0.5 % — ABNORMAL HIGH (ref 0.0–0.2)

## 2020-05-28 LAB — BPAM RBC
Blood Product Expiration Date: 202201052359
Blood Product Expiration Date: 202201102359
ISSUE DATE / TIME: 202112112320
ISSUE DATE / TIME: 202112120947
Unit Type and Rh: 6200
Unit Type and Rh: 6200

## 2020-05-28 LAB — BASIC METABOLIC PANEL
Anion gap: 13 (ref 5–15)
BUN: 39 mg/dL — ABNORMAL HIGH (ref 8–23)
CO2: 28 mmol/L (ref 22–32)
Calcium: 9.2 mg/dL (ref 8.9–10.3)
Chloride: 100 mmol/L (ref 98–111)
Creatinine, Ser: 4.69 mg/dL — ABNORMAL HIGH (ref 0.44–1.00)
GFR, Estimated: 10 mL/min — ABNORMAL LOW (ref >60)
Glucose, Bld: 121 mg/dL — ABNORMAL HIGH (ref 70–99)
Potassium: 3.7 mmol/L (ref 3.5–5.1)
Sodium: 141 mmol/L (ref 135–145)

## 2020-05-28 LAB — TYPE AND SCREEN
ABO/RH(D): A POS
Antibody Screen: NEGATIVE
Unit division: 0
Unit division: 0

## 2020-05-28 LAB — MAGNESIUM: Magnesium: 2.1 mg/dL (ref 1.7–2.4)

## 2020-05-28 SURGERY — ENTEROSCOPY
Anesthesia: Monitor Anesthesia Care

## 2020-05-28 MED ORDER — MISOPROSTOL 100 MCG PO TABS
100.0000 ug | ORAL_TABLET | Freq: Four times a day (QID) | ORAL | Status: DC
  Administered 2020-05-28 – 2020-05-29 (×4): 100 ug via ORAL
  Filled 2020-05-28 (×6): qty 1

## 2020-05-28 MED ORDER — LIDOCAINE 2% (20 MG/ML) 5 ML SYRINGE
INTRAMUSCULAR | Status: DC | PRN
  Administered 2020-05-28: 60 mg via INTRAVENOUS

## 2020-05-28 MED ORDER — SODIUM CHLORIDE 0.9 % IV SOLN: INTRAVENOUS | Status: DC | PRN

## 2020-05-28 MED ORDER — CHLORHEXIDINE GLUCONATE CLOTH 2 % EX PADS: 6.0000 | MEDICATED_PAD | Freq: Every day | CUTANEOUS | Status: DC

## 2020-05-28 MED ORDER — FLUCONAZOLE 100 MG PO TABS
100.0000 mg | ORAL_TABLET | Freq: Every evening | ORAL | Status: DC
  Administered 2020-05-28: 100 mg via ORAL
  Filled 2020-05-28 (×2): qty 1

## 2020-05-28 MED ORDER — PROPOFOL 10 MG/ML IV BOLUS
INTRAVENOUS | Status: DC | PRN
  Administered 2020-05-28 (×4): 20 mg via INTRAVENOUS

## 2020-05-28 MED ORDER — PROPOFOL 500 MG/50ML IV EMUL
INTRAVENOUS | Status: DC | PRN
  Administered 2020-05-28: 75 ug/kg/min via INTRAVENOUS

## 2020-05-28 SURGICAL SUPPLY — 15 items

## 2020-05-28 NOTE — Anesthesia Procedure Notes (Signed)
Procedure Name: MAC Date/Time: 05/28/2020 9:51 AM Performed by: Trinna Post., CRNA Pre-anesthesia Checklist: Patient identified, Emergency Drugs available, Suction available, Patient being monitored and Timeout performed Patient Re-evaluated:Patient Re-evaluated prior to induction Oxygen Delivery Method: Nasal cannula Preoxygenation: Pre-oxygenation with 100% oxygen Induction Type: IV induction Placement Confirmation: positive ETCO2

## 2020-05-28 NOTE — Progress Notes (Signed)
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  She denies abdominal pain, discomfort, nausea or vomiting.  She is on oral iron and says her stools still is dark.  She denies NSAID/blood thinner use.  Takes Protonix 20 mg twice daily at home.  She had EGD November 2021 for anemia and heme positive stool.  Patient was noted to have grade B esophagitis and antral gastric AVM which was treated with APC.  Upon arrival to ED: Patient's hemoglobin was noted to be 6.1, occult blood positive, vital signs remained stable, patient was given 1 unit PRBC.  GI consulted.  Patient admitted for further evaluation and management of acute blood loss anemia/GI bleed.  Assessment & Plan:   Acute blood loss anemia: -In the setting of GI bleed and large superficial subcutaneous hematoma-right hip -H&H upon arrival: 6.1.  Occult blood positive -Received 2 units PRBC in ED. hemoglobin: 8.7 this morning. 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  Recommended Diflucan 100 mg p.o. daily for 7 days.  PPI twice daily for 2 months and then once daily.  Misoprostol 400 mcg p.o. 4 times daily for 4 weeks. -Appreciate GI help.  Monitor H&H closely and transfuse PRBC if hemoglobin less than 7.  right hip hematoma:  -Patient had a fall last week -Reviewed MRI which showed large superficial subcutaneous hematoma septal torn labrum superiorly in the right hip  joint. -Dilaudid as needed for pain control.  Monitor H&H closely. -Consulted orthopedic surgery-await recommendations  ESRD on hemodialysis-TTS: Patient appears euvolemic.   -Potassium: WNL. -INO's and monitor signs for fluid overload. -Nephrology consult for dialysis tomorrow  Hypertension: Stable -Continue home meds Coreg, clonidine, hydralazine, Imdur -Monitor blood pressure closely  Hyperlipidemia: Continue statin  Chronic diastolic CHF: Patient appears euvolemic on exam -Continue home meds statin, Coreg, Imdur  Depression/anxiety: Continue Zoloft, trazodone  GERD with esophagitis: Continue PPI  Tobacco abuse: Nicotine patch.  Counseled about cessation.  Left AKA: aware  DVT prophylaxis: SCD Code Status: Full code Family Communication:  None present at bedside.  Plan of care discussed with patient in length and she verbalized understanding and agreed with it. Disposition Plan: Home  Consultants:   GI  Orthopedic surgery  Nephrology   Procedures:   EGD  MRI hip  Antimicrobials:   None  Status is: Inpatient  Dispo: The patient is from: Home              Anticipated d/c is to: Home              Anticipated d/c date is: 1 to 2 days              Patient currently is not medically stable to d/c.  Subjective: Patient seen and examined.  Sitting comfortably on the bed.  N.p.o. after midnight for scheduled EGD this morning.  She tells me that she is waiting for EGD to be done to see what is wrong with her.  She denies any new complaints including headache, chest  pain, shortness of breath, fever, chills, nausea, vomiting, epigastric pain.  Objective: Vitals:   05/28/20 1034 05/28/20 1044 05/28/20 1053 05/28/20 1130  BP: (!) 147/62 (!) 162/65 (!) 147/60 (!) 143/64  Pulse: 92 89 84 81  Resp: 20 (!) 21 17 18   Temp: 97.9 F (36.6 C)   98.2 F (36.8 C)  TempSrc: Temporal   Oral  SpO2: 94% 98% 97% 100%  Weight:      Height:        Intake/Output Summary  (Last 24 hours) at 05/28/2020 1228 Last data filed at 05/28/2020 1027 Gross per 24 hour  Intake 1145 ml  Output 0 ml  Net 1145 ml   Filed Weights   05/26/20 1628  Weight: 59 kg    Examination:  General exam: Appears calm and comfortable, elderly, on room air, communicating well respiratory system: Clear to auscultation. Respiratory effort normal. Cardiovascular system: S1 & S2 heard, RRR. No JVD, murmurs, rubs, gallops or clicks. No pedal edema. Gastrointestinal system: Abdomen is nondistended, soft and nontender. No organomegaly or masses felt. Normal bowel sounds heard. Central nervous system: Alert and oriented. No focal neurological deficits. Extremities: Left AKA, right upper thigh swelling noted, firm, nonerythematous, tender on palpation. Skin: No rashes, lesions or ulcers Psychiatry: Judgement and insight appear normal. Mood & affect appropriate.    Data Reviewed: I have personally reviewed following labs and imaging studies  CBC: Recent Labs  Lab 05/26/20 1708 05/27/20 0313 05/27/20 1618 05/28/20 0314  WBC 5.4 5.3  --  6.7  HGB 6.1* 6.9* 8.8* 8.7*  HCT 19.5* 22.5* 28.3* 27.9*  MCV 98.0 97.4  --  94.6  PLT 206 177  --  540   Basic Metabolic Panel: Recent Labs  Lab 05/26/20 1708 05/27/20 0313 05/28/20 0314  NA 137 139 141  K 3.1* 3.6 3.7  CL 93* 98 100  CO2 34* 28 28  GLUCOSE 130* 108* 121*  BUN 13 24* 39*  CREATININE 2.19* 3.15* 4.69*  CALCIUM 7.9* 8.0* 9.2  MG  --   --  2.1   GFR: Estimated Creatinine Clearance: 11.1 mL/min (A) (by C-G formula based on SCr of 4.69 mg/dL (H)). Liver Function Tests: Recent Labs  Lab 05/26/20 1708  AST 12*  ALT 19  ALKPHOS 69  BILITOT 0.6  PROT 6.0*  ALBUMIN 2.9*   No results for input(s): LIPASE, AMYLASE in the last 168 hours. No results for input(s): AMMONIA in the last 168 hours. Coagulation Profile: No results for input(s): INR, PROTIME in the last 168 hours. Cardiac Enzymes: No results for input(s):  CKTOTAL, CKMB, CKMBINDEX, TROPONINI in the last 168 hours. BNP (last 3 results) No results for input(s): PROBNP in the last 8760 hours. HbA1C: No results for input(s): HGBA1C in the last 72 hours. CBG: No results for input(s): GLUCAP in the last 168 hours. Lipid Profile: No results for input(s): CHOL, HDL, LDLCALC, TRIG, CHOLHDL, LDLDIRECT in the last 72 hours. Thyroid Function Tests: No results for input(s): TSH, T4TOTAL, FREET4, T3FREE, THYROIDAB in the last 72 hours. Anemia Panel: No results for input(s): VITAMINB12, FOLATE, FERRITIN, TIBC, IRON, RETICCTPCT in the last 72 hours. Sepsis Labs: No results for input(s): PROCALCITON, LATICACIDVEN in the last 168 hours.  Recent Results (from the past 240 hour(s))  Resp Panel by RT-PCR (Flu A&B, Covid) Nasopharyngeal Swab     Status: None   Collection Time: 05/27/20  2:46 AM   Specimen: Nasopharyngeal Swab; Nasopharyngeal(NP) swabs in vial transport medium  Result Value Ref Range  Status   SARS Coronavirus 2 by RT PCR NEGATIVE NEGATIVE Final    Comment: (NOTE) SARS-CoV-2 target nucleic acids are NOT DETECTED.  The SARS-CoV-2 RNA is generally detectable in upper respiratory specimens during the acute phase of infection. The lowest concentration of SARS-CoV-2 viral copies this assay can detect is 138 copies/mL. A negative result does not preclude SARS-Cov-2 infection and should not be used as the sole basis for treatment or other patient management decisions. A negative result may occur with  improper specimen collection/handling, submission of specimen other than nasopharyngeal swab, presence of viral mutation(s) within the areas targeted by this assay, and inadequate number of viral copies(<138 copies/mL). A negative result must be combined with clinical observations, patient history, and epidemiological information. The expected result is Negative.  Fact Sheet for Patients:  EntrepreneurPulse.com.au  Fact Sheet  for Healthcare Providers:  IncredibleEmployment.be  This test is no t yet approved or cleared by the [place] FDA and  has been authorized for detection and/or diagnosis of SARS-CoV-2 by FDA under an Emergency Use Authorization (EUA). This EUA will remain  in effect (meaning this test can be used) for the duration of the COVID-19 declaration under Section 564(b)(1) of the Act, 21 U.S.C.section 360bbb-3(b)(1), unless the authorization is terminated  or revoked sooner.       Influenza A by PCR NEGATIVE NEGATIVE Final   Influenza B by PCR NEGATIVE NEGATIVE Final    Comment: (NOTE) The Xpert Xpress SARS-CoV-2/FLU/RSV plus assay is intended as an aid in the diagnosis of influenza from Nasopharyngeal swab specimens and should not be used as a sole basis for treatment. Nasal washings and aspirates are unacceptable for Xpert Xpress SARS-CoV-2/FLU/RSV testing.  Fact Sheet for Patients: EntrepreneurPulse.com.au  Fact Sheet for Healthcare Providers: IncredibleEmployment.be  This test is not yet approved or cleared by the [place] FDA and has been authorized for detection and/or diagnosis of SARS-CoV-2 by FDA under an Emergency Use Authorization (EUA). This EUA will remain in effect (meaning this test can be used) for the duration of the COVID-19 declaration under Section 564(b)(1) of the Act, 21 U.S.C. section 360bbb-3(b)(1), unless the authorization is terminated or revoked.  Performed at [facility], [place] [address] [place], [place] [address]   MRSA PCR Screening     Status: None   Collection Time: 05/27/20  5:30 PM   Specimen: Nasal Mucosa; Nasopharyngeal  Result Value Ref Range Status   MRSA by PCR NEGATIVE NEGATIVE Final    Comment:        The GeneXpert MRSA Assay (FDA approved for NASAL specimens only), is one component of a comprehensive MRSA colonization surveillance program. It is not intended to  diagnose MRSA infection nor to guide or monitor treatment for MRSA infections. Performed at [facility], [place] [address] [place], [place] [address]       Radiology Studies: MR HIP RIGHT WO CONTRAST  Addendum Date: 05/27/2020   ADDENDUM REPORT: 05/27/2020 09:41 ADDENDUM: Patient has a subtle torn labrum superiorly in the RIGHT hip joint noted on images 13-17 of series 11. Electronically Signed   By: Suzy Bouchard M.D.   On: 05/27/2020 09:41   Result Date: 05/27/2020 CLINICAL DATA:  FALL 1 WEEK PRIOR. SWELLING OF THE RIGHT HIP. LOW HEMOGLOBIN. NORMAL WHITE BLOOD CELL COUNT EXAM: MR OF THE RIGHT HIP WITHOUT CONTRAST TECHNIQUE: Multiplanar, multisequence MR imaging was performed. No intravenous contrast was administered. COMPARISON:  None. FINDINGS: Bones: No evidence of fracture of the RIGHT hip.  No sacral fracture. Hips are located. Articular cartilage and labrum Articular cartilage:  No acute findings Labrum:  No acute finding Joint or bursal effusion Joint effusion:  No effusion Bursae: No bursitis Muscles and tendons Muscles and tendons:  Unremarkable Other findings Miscellaneous: There is an ovoid mass within the subcutaneous tissue of the RIGHT hip overlying the greater trochanter measuring 13.0 x 4.5 by 5.5 cm (volume = 170 cm^3). There is edema within the subcutaneous tissue surrounding the mass. Findings are most consistent with hematoma associated with trauma. No evidence of abscess or infection. IMPRESSION: 1. Large superficial subcutaneous hematoma overlying the RIGHT hip. 2. No evidence of abscess. 3. No RIGHT hip fracture or infection. Electronically Signed: By: Suzy Bouchard M.D. On: 05/27/2020 04:52    Scheduled Meds:  sodium chloride   Intravenous Once   sodium chloride   Intravenous Once   atorvastatin  10 mg Oral QHS   calcium acetate  1,334 mg Oral Q lunch   calcium acetate  2,001 mg Oral BID WC   carvedilol  3.125 mg Oral BID   cloNIDine  0.1 mg Oral 2  times per day on Sun Mon Wed Fri   fluconazole  100 mg Oral QPM   gabapentin  200 mg Oral 3 times per day on Sun Mon Wed Fri   [START ON 05/29/2020] gabapentin  300 mg Oral 2 times per day on Tue Thu Sat   hydrALAZINE  100 mg Oral 2 times per day on Sun Mon Wed Fri   isosorbide mononitrate  30 mg Oral Daily   misoprostol  100 mcg Oral Q6H   nicotine  7 mg Transdermal Daily   pantoprazole (PROTONIX) IV  40 mg Intravenous Daily   sertraline  100 mg Oral QPM   Continuous Infusions:   LOS: 1 day   Time spent: 35 minutes   Arshan Jabs Loann Quill, MD [facility]  If 7PM-7AM, please contact night-coverage [email or web address] 05/28/2020, 12:28 PM

## 2020-05-28 NOTE — Telephone Encounter (Signed)
Current admission 

## 2020-05-28 NOTE — Addendum Note (Signed)
Addended by: Merri Ray A on: 05/28/2020 11:18 AM   Modules accepted: Orders

## 2020-05-28 NOTE — Anesthesia Postprocedure Evaluation (Signed)
Anesthesia Post Note  Patient: MILINDA SWEENEY  Procedure(s) Performed: ENTEROSCOPY (N/A ) HOT HEMOSTASIS (ARGON PLASMA COAGULATION/BICAP) (N/A )     Patient location during evaluation: Endoscopy Anesthesia Type: MAC Level of consciousness: awake and alert Pain management: pain level controlled Vital Signs Assessment: post-procedure vital signs reviewed and stable Respiratory status: spontaneous breathing, nonlabored ventilation, respiratory function stable and patient connected to nasal cannula oxygen Cardiovascular status: stable and blood pressure returned to baseline Postop Assessment: no apparent nausea or vomiting Anesthetic complications: no   No complications documented.  Last Vitals:  Vitals:   05/28/20 1130 05/28/20 1229  BP: (!) 143/64 (!) 126/58  Pulse: 81 78  Resp: 18   Temp: 36.8 C 36.9 C  SpO2: 100% 96%    Last Pain:  Vitals:   05/28/20 1229  TempSrc: Oral  PainSc:                  Kenny Stern COKER

## 2020-05-28 NOTE — Interval H&P Note (Signed)
History and Physical Interval Note:  05/28/2020 9:32 AM  Brandy Houston  has presented today for surgery, with the diagnosis of anemia, hx AVM's poss GAVE.  The various methods of treatment have been discussed with the patient and family. After consideration of risks, benefits and other options for treatment, the patient has consented to  Procedure(s): ESOPHAGOGASTRODUODENOSCOPY (EGD) WITH PROPOFOL (N/A) ENTEROSCOPY (N/A) as a surgical intervention.  [name] history has been reviewed, patient examined, no change in status, stable for surgery.  I have reviewed the patient's chart and labs.  Questions were answered to the patient's satisfaction.     Rashada Klontz

## 2020-05-28 NOTE — Op Note (Addendum)
[facility] Patient Name: Brandy Houston Procedure Date : 05/28/2020 MRN: [id number] Attending MD: Mauri Pole , MD Date of Birth: [birth year] CSN: [id number] Age: 64 Admit Type: Inpatient Procedure:                Upper GI endoscopy Indications:              Active gastrointestinal bleeding, Suspected upper                            gastrointestinal bleeding, Suspected upper                            gastrointestinal bleeding in patient with chronic                            blood loss Providers:                Mauri Pole, MD, Particia Nearing, RN, Faustina                            Mbumina, Technician Referring MD:              Medicines:                Monitored Anesthesia Care Complications:            No immediate complications. Estimated Blood Loss:     Estimated blood loss was minimal. Procedure:                Pre-Anesthesia Assessment:                           - Prior to the procedure, a History and Physical                            was performed, and patient medications and                            allergies were reviewed. [name] tolerance of                            previous anesthesia was also reviewed. The risks                            and benefits of the procedure and the sedation                            options and risks were discussed with the patient.                            All questions were answered, and informed consent                            was obtained. Prior Anticoagulants: The patient has                            taken no previous anticoagulant  or antiplatelet                            agents. ASA Grade Assessment: III - A patient with                            severe systemic disease. After reviewing the risks                            and benefits, the patient was deemed in                            satisfactory condition to undergo the procedure.                           After obtaining  informed consent, the endoscope was                            passed under direct vision. Throughout the                            procedure, the patient's blood pressure, pulse, and                            oxygen saturations were monitored continuously. The                            [id number]-[id number] ([id number] Olympus pediatric colonscope                            was introduced through the mouth, and advanced to                            the proximal jejunum. The upper GI endoscopy was                            accomplished without difficulty. The patient                            tolerated the procedure well. Scope In: Scope Out: Findings:      LA Grade C (one or more mucosal breaks continuous between tops of 2 or       more mucosal folds, less than 75% circumference) esophagitis with no       bleeding was found 34 to 35 cm from the incisors.      Esophagitis with no bleeding was found 20 to 35 cm from the incisors.      Mild gastric antral vascular ectasia with contact bleeding was present       in the gastric antrum, in the prepyloric region of the stomach and in       the pylorus. Coagulation for hemostasis using argon plasma was       successful.      A few less than 5 mm angiodysplastic lesions with bleeding were found in       the gastric fundus, in the gastric body and  on the lesser curvature of       the stomach. Coagulation for hemostasis using argon plasma was       successful.      Two less than 1 mm angiodysplastic lesions with bleeding on contact were       found in the duodenal bulb and in the second portion of the duodenum.       Coagulation for hemostasis using argon plasma was successful.      Two less than 1 mm angiodysplastic lesions without bleeding were found       in the jejunum. Coagulation for hemostasis using argon plasma was       successful. Impression:               - LA Grade C reflux esophagitis with no bleeding.                           -  Candidiasis esophagitis with no bleeding.                           - Gastric antral vascular ectasia with bleeding.                            Treated with argon plasma coagulation (APC).                           - A few bleeding angiodysplastic lesions in the                            stomach. Treated with argon plasma coagulation                            (APC).                           - Two angiodysplastic lesions in the duodenum.                            Treated with argon plasma coagulation (APC).                           - Two non-bleeding angiodysplastic lesions in the                            jejunum. Treated with argon plasma coagulation                            (APC).                           - No specimens collected. Recommendation:           - Patient has a contact number available for                            emergencies. The signs and symptoms of potential  delayed complications were discussed with the                            patient. Return to normal activities tomorrow.                            Written discharge instructions were provided to the                            patient.                           - Clear liquid diet today, then advance as                            tolerated to soft diet.                           - Diflucan (fluconazole) 100 mg PO daily for 7 days.                           - Follow an antireflux regimen.                           - Use Protonix (pantoprazole) 40 mg PO BID for 2                            months followed by once daily.                           - Use misoprostol 100 micrograms PO QID for 4 weeks.                           - Monitor Hgb q12h and transfuse as needed to                            maintain Hgb >7                           - If Hgb remains stable, ok to discharge home                            tomorrow                           - Will need routine monitoring of CBC  during                            dialysis with erythropeitin and IV iron therapy as                            needed                           - Follow up in GI office in 2 months Procedure Code(s):        ---  Professional ---                           [phone], Esophagogastroduodenoscopy, flexible,                            transoral; with control of bleeding, any method Diagnosis Code(s):        --- Professional ---                           K21.00, Gastro-esophageal reflux disease with                            esophagitis, without bleeding                           B37.81, Candidal esophagitis                           K31.811, Angiodysplasia of stomach and duodenum                            with bleeding                           K55.20, Angiodysplasia of colon without hemorrhage                           K92.2, Gastrointestinal hemorrhage, unspecified                           R58, Hemorrhage, not elsewhere classified CPT copyright 2019 American Medical Association. All rights reserved. The codes documented in this report are preliminary and upon coder review may  be revised to meet current compliance requirements. Mauri Pole, MD 05/28/2020 10:43:43 AM This report has been signed electronically. Number of Addenda: 0

## 2020-05-28 NOTE — Transfer of Care (Signed)
Immediate Anesthesia Transfer of Care Note  Patient: ANJOLINA BYRER  Procedure(s) Performed: ESOPHAGOGASTRODUODENOSCOPY (EGD) WITH PROPOFOL (N/A ) ENTEROSCOPY (N/A ) HOT HEMOSTASIS (ARGON PLASMA COAGULATION/BICAP) (N/A )  Patient Location: PACU and Endoscopy Unit  Anesthesia Type:MAC  Level of Consciousness: drowsy  Airway & Oxygen Therapy: Patient Spontanous Breathing  Post-op Assessment: Report given to RN and Post -op Vital signs reviewed and stable  Post vital signs: Reviewed and stable  Last Vitals:  Vitals Value Taken Time  BP 147/62 05/28/20 1034  Temp    Pulse 92 05/28/20 1034  Resp 20 05/28/20 1034  SpO2 94 % 05/28/20 1034    Last Pain:  Vitals:   05/28/20 0926  TempSrc: Oral  PainSc: 7          Complications: No complications documented.

## 2020-05-29 ENCOUNTER — Encounter (HOSPITAL_COMMUNITY): Payer: Self-pay | Admitting: Gastroenterology

## 2020-05-29 DIAGNOSIS — K31819 Angiodysplasia of stomach and duodenum without bleeding: Secondary | ICD-10-CM

## 2020-05-29 DIAGNOSIS — Z720 Tobacco use: Secondary | ICD-10-CM

## 2020-05-29 DIAGNOSIS — K5521 Angiodysplasia of colon with hemorrhage: Secondary | ICD-10-CM

## 2020-05-29 DIAGNOSIS — E876 Hypokalemia: Secondary | ICD-10-CM

## 2020-05-29 LAB — BASIC METABOLIC PANEL
Anion gap: 9 (ref 5–15)
BUN: 43 mg/dL — ABNORMAL HIGH (ref 8–23)
CO2: 28 mmol/L (ref 22–32)
Calcium: 8.9 mg/dL (ref 8.9–10.3)
Chloride: 101 mmol/L (ref 98–111)
Creatinine, Ser: 5.48 mg/dL — ABNORMAL HIGH (ref 0.44–1.00)
GFR, Estimated: 8 mL/min — ABNORMAL LOW (ref >60)
Glucose, Bld: 110 mg/dL — ABNORMAL HIGH (ref 70–99)
Potassium: 3.4 mmol/L — ABNORMAL LOW (ref 3.5–5.1)
Sodium: 138 mmol/L (ref 135–145)

## 2020-05-29 LAB — CBC
HCT: 24.7 % — ABNORMAL LOW (ref 36.0–46.0)
HCT: 29.8 % — ABNORMAL LOW (ref 36.0–46.0)
Hemoglobin: 7.9 g/dL — ABNORMAL LOW (ref 12.0–15.0)
Hemoglobin: 9 g/dL — ABNORMAL LOW (ref 12.0–15.0)
MCH: 30.4 pg (ref 26.0–34.0)
MCH: 29.2 pg (ref 26.0–34.0)
MCHC: 32 g/dL (ref 30.0–36.0)
MCHC: 30.2 g/dL (ref 30.0–36.0)
MCV: 95 fL (ref 80.0–100.0)
MCV: 96.8 fL (ref 80.0–100.0)
Platelets: 178 10*3/uL (ref 150–400)
Platelets: 216 10*3/uL (ref 150–400)
RBC: 2.6 MIL/uL — ABNORMAL LOW (ref 3.87–5.11)
RBC: 3.08 MIL/uL — ABNORMAL LOW (ref 3.87–5.11)
RDW: 20.8 % — ABNORMAL HIGH (ref 11.5–15.5)
RDW: 20.4 % — ABNORMAL HIGH (ref 11.5–15.5)
WBC: 5.6 10*3/uL (ref 4.0–10.5)
WBC: 7.1 10*3/uL (ref 4.0–10.5)
nRBC: 0.4 % — ABNORMAL HIGH (ref 0.0–0.2)
nRBC: 0 % (ref 0.0–0.2)

## 2020-05-29 MED ORDER — MISOPROSTOL 100 MCG PO TABS: 100.0000 ug | ORAL_TABLET | Freq: Four times a day (QID) | ORAL | 0 refills | Status: DC

## 2020-05-29 MED ORDER — FLUCONAZOLE 100 MG PO TABS: 100.0000 mg | ORAL_TABLET | Freq: Every evening | ORAL | 0 refills | Status: AC

## 2020-05-29 MED ORDER — PANTOPRAZOLE SODIUM 40 MG PO TBEC: 40.0000 mg | DELAYED_RELEASE_TABLET | Freq: Two times a day (BID) | ORAL | 1 refills | Status: DC

## 2020-05-29 NOTE — Progress Notes (Signed)
Renal Navigator notified by Nephrology team that patient may be ready for discharge today and asked to arrange outpatient HD if possible due to high patient census. Navigator spoke with Attending who agrees with discharge today. Navigator spoke with patient's outpatient HD clinic, who state they can accommodate patient today and ask that she be sent straight to clinic as soon as possible, as her normal seat time is 10:30am. Navigator met with patient, who states her son cannot come get her because he is preparing to leave town for a funeral. Navigator evaluated if we can arrange wheelchair transportation, but Navigator is unfortunately unable to secure it quickly enough with [facility] or [facility] within the timeframe we have. Renal Navigator told patient that we will get her in to the unit here as soon as we can, but that a time cannot be given to her. Patient then stated that she just wants to go home and that she does not want dialysis at the hospital today. She states that she may try to go to her clinic later today if they are still open by the time her son can come get her today. Navigator explained that we are recommending that she have HD today, that she most likely will not be able to get in to her clinic later today, and assured her that we will try to get her in here as soon as we are able, but again, cannot provide her with a time in the inpatient unit. Patient's RN was in the room and asked about her blood pressure medication. Navigator told RN background of CSW and cannot comment on medication. Patient calmly said, "I want my blood pressure medication. I'm going home."  Patient able to make her own decisions. Navigator asked patient if she is willing to allow Navigator to reschedule her treatment to tomorrow at her outpatient clinic, and she said she can call herself once she knows her transportation/son's availability. Navigator ensured that on a "regular day" she has reliable  transportation to HD. She confirmed that she typically uses [facility]. Navigator updated Attending, Nephrologist and patient's clinic. She has been removed from the inpt HD list today and she states she is refusing HD today.  Alphonzo Cruise, [place] Renal Navigator [phone]

## 2020-05-29 NOTE — Consult Note (Signed)
   [facility] CM Inpatient Consult   05/29/2020  PRESLIE DEPASQUALE [birth year] [id number]  Patient chart reviewed for potential [facility] CM) services due to high unplanned readmission risk score, 26%, and 3 hospitalizations within past six months. Patient is eligible for services as benefit of Medicare insurance plan.   Spoke with patient by telephone. HIPAA verified. Explained [facility] CM services as community care coordination and chronic disease management service. Assessed patient need for transportation services. Patient states that her son provides transportation and that she uses [facility] services for transport to medical appointments. Patient states that she takes care of herself "pretty well" and declines services at this time. Explained that she may avail herself of services in the future if needed. Patient verbalizes understanding.  Of note, [facility] Care Management services does not replace or interfere with any services that are arranged by inpatient case management or social work.  Netta Cedars, MSN, [facility] Liaison Nurse Mobile Phone [phone]  Toll free office [phone]

## 2020-05-29 NOTE — Discharge Summary (Signed)
Physician Discharge Summary  Brandy Houston:[id number] DOB: December 29, 1955 DOA: 05/26/2020  PCP: [name], MD  Admit date: 05/26/2020 Discharge date: 05/29/2020  Admitted From: Home Disposition:  Home  Recommendations for Outpatient Follow-up:  1. Follow-up with PCP in 1 week 2. Dialysis tomorrow and check CBC during dialysis appointments 3. Follow-up with GI in 2 months 4. Take Protonix 40 mg twice daily for 2 months and then once daily 5. Take fluconazole once daily for 7 days 6. Take misoprostol 100 mcg 4 times daily for 4 weeks 7. 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  She denies abdominal pain, discomfort, nausea or vomiting.  She is on oral iron and says her stools still is dark.  She denies NSAID/blood thinner use.  Takes Protonix 20 mg twice daily at home.  She had EGD November 2021 for anemia and heme positive stool.  Patient was noted to have grade B esophagitis and antral gastric AVM which was treated with APC.  Upon arrival to ED: Patient's hemoglobin was noted to be 6.1, occult blood positive, vital signs remained stable, patient was given 1 unit PRBC.  GI consulted.  Patient admitted for further evaluation and management of acute blood loss anemia/GI bleed.  Acute upper GI bleed: -H&H upon arrival: 6.1.  Occult blood positive -Received 2 units PRBC in ED. hemoglobin: 8.7 this morning. -Underwent EGD on 05/28/2020-shows grade C reflux esophagitis with no bleeding, candidiasis esophagitis with no bleeding, gastric antral vascular  ectasia with bleeding treated with APC.  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   -Recommended Diflucan 100 mg p.o. daily for 7 days.  PPI twice daily for 2 months and then once daily.  Misoprostol 100 mcg p.o. 4 times daily for 4 weeks. -Appreciate GI help.  H&H 7.9 this morning. -Discussed with GI Dr. Silverio Decamp via secure chat-okay to DC from their standpoint and recommend repeat/monitor CBC during dialysis session   Right hip hematoma:  -Patient had a fall last week -Reviewed MRI which showed large superficial subcutaneous hematoma septal torn labrum superiorly in the right hip joint. -Dilaudid as needed for pain control.   -Consulted orthopedic surgery recommended outpatient follow-up.   -Patient swelling and pain has improved.  ESRD on hemodialysis-TTS: Patient appears euvolemic.   -Potassium: WNL. -INO's and monitor signs for fluid overload. -Nephrology consult for dialysis-patient refused for dialysis today and wishes to go home.  She tells me that she will go for dialysis tomorrow.  Hypertension:  Remained stable -Continued home meds Coreg, clonidine, hydralazine, Imdur  Hyperlipidemia: Continued statin  Chronic diastolic CHF: Patient appears euvolemic on exam -Continued home meds statin, Coreg, Imdur  Depression/anxiety: Continued Zoloft, trazodone  GERD with esophagitis: Continued PPI  Tobacco abuse: Nicotine patch.  Counseled about cessation.  Left AKA: aware  Discharge Diagnoses:  Acute GI bleed, ESRD on hemodialysis TTS Hypertension Hyperlipidemia Chronic diastolic CHF Depression/anxiety GERD with esophagitis Tobacco abuse Left AKA  Discharge Instructions  Discharge Instructions    Discharge instructions   Complete by: As directed    Follow-up with PCP in 1 week Will need routine monitoring of CBC during  dialysis  Follow-up with GI Follow-up with orthopedic surgery Take fluconazole once daily for 7  days Take Protonix 40 mg twice daily for 2 months and then once daily Take misoprostol 100 mcg 4 times daily for 4 weeks.   Increase activity slowly   Complete by: As directed      Allergies as of 05/29/2020      Reactions   Compazine [prochlorperazine] Shortness Of Breath, Swelling, Other (See Comments)   TONGUE SWELLS   Shellfish-derived Products Anaphylaxis   Iodinated Diagnostic Agents Hives, Rash   Omnipaque [iohexol] Hives   Tape    Brown paper tape caused skin irritation   Sulfa Antibiotics Rash      Medication List    STOP taking these medications   aspirin EC 81 MG tablet     TAKE these medications   Accu-Chek Guide test strip Generic drug: glucose blood Check blood sugar up to 4 times a day   Accu-Chek Guide w/Device Kit 1 Device by Does not apply route daily.   accu-chek softclix lancets Check sugars TID for E11.65   Accu-Chek Softclix Lancets lancets Check sugars three times a day for E11.65   acetaminophen 500 MG tablet Commonly known as: TYLENOL Take 500-1,000 mg by mouth every 6 (six) hours as needed (for pain.).   atorvastatin 10 MG tablet Commonly known as: LIPITOR TAKE 1 TABLET BY MOUTH AT BEDTIME . APPOINTMENT REQUIRED FOR FUTURE REFILLS What changed: See the new instructions.   blood glucose meter kit and supplies Kit Dispense based on patient and insurance preference. Use up to four times daily as directed   calcium acetate 667 MG capsule Commonly known as: PHOSLO Take 1,334-2,001 mg by mouth See admin instructions. Take 3 capsules (2001 mg) by mouth with breakfast, take 2 capsules (1334 mg) by mouth with lunch, take 3 capsules (2001 mg) by mouth with supper & take 2 capsules (1334 mg) by mouth with snacks.   carvedilol 3.125 MG tablet Commonly known as: COREG Take 1 tablet (3.125 mg total) by mouth 2 (two) times daily.   cloNIDine 0.1 MG tablet Commonly known as: CATAPRES Take 0.1 mg by mouth See admin instructions. Bid on  non-dialysis days( m,w,f,sun)   diphenhydrAMINE 25 mg capsule Commonly known as: BENADRYL Take 25 mg by mouth every 6 (six) hours as needed for allergies.   fluconazole 100 MG tablet Commonly known as: DIFLUCAN Take 1 tablet (100 mg total) by mouth every evening for 7 days.   gabapentin 300 MG capsule Commonly known as: NEURONTIN Take 300 mg by mouth See admin instructions. Take 1 capsule (300 mg) by mouth twice daily on Tuesdays, Thursdays, & Saturdays.   gabapentin 100 MG capsule Commonly known as: NEURONTIN Take 200 mg by mouth See admin instructions. Take 2 capsules (200 mg) by mouth 3 times on Sundays, Mondays, Wednesdays, & Fridays.   hydrALAZINE 100 MG tablet Commonly known as: APRESOLINE Take 100 mg by mouth See admin instructions. Bid on non-dialysis days.( m,w,f,sun)   isosorbide mononitrate 30 MG 24 hr tablet Commonly known as: IMDUR Take 1 tablet (30 mg total) by mouth daily.   lidocaine-prilocaine cream Commonly known as: EMLA Apply 1 application topically Every Tuesday,Thursday,and Saturday with dialysis. On days of dialysis 3x week   methocarbamol 500 MG tablet Commonly known as: ROBAXIN Take 500 mg by mouth every 8 (eight) hours as needed for muscle spasms.   MIRCERA IJ Get it at dialysis   misoprostol 100 MCG tablet Commonly known as: CYTOTEC Take  1 tablet (100 mcg total) by mouth every 6 (six) hours.   multivitamin Tabs tablet Take 1 tablet by mouth daily.   nitroGLYCERIN 0.4 MG SL tablet Commonly known as: NITROSTAT Place 1 tablet (0.4 mg total) under the tongue every 5 (five) minutes as needed.   pantoprazole 40 MG tablet Commonly known as: Protonix Take 1 tablet (40 mg total) by mouth 2 (two) times daily. 40 mg BID for 2 months & then once daily What changed:   medication strength  how much to take  when to take this  additional instructions   polyethylene glycol 17 g packet Commonly known as: MIRALAX / GLYCOLAX Take 17 g by mouth  daily as needed for moderate constipation.   sertraline 100 MG tablet Commonly known as: ZOLOFT Take 1 tablet by mouth once daily What changed: when to take this   traZODone 50 MG tablet Commonly known as: DESYREL Take 50 mg by mouth at bedtime as needed for sleep.       Follow-up Information    Call Erle Crocker, MD.   Specialty: Orthopedic Surgery Why: As needed for continued hip pain. Contact information: [place] [address] [phone]        [name], MD Follow up in 1 week(s).   Specialty: Family Medicine Contact information: [address] [place] [address] [phone]        Donato Heinz, MD .   Specialties: Cardiology, Radiology Contact information: [address] [place] [address] [phone]              Allergies  Allergen Reactions  . Compazine [Prochlorperazine] Shortness Of Breath, Swelling and Other (See Comments)    TONGUE SWELLS  . Shellfish-Derived Products Anaphylaxis  . Iodinated Diagnostic Agents Hives and Rash  . Omnipaque [Iohexol] Hives  . Tape     Brown paper tape caused skin irritation  . Sulfa Antibiotics Rash    Consultations:  GI  Orthopedic surgery  Nephrology   Procedures/Studies: MR HIP RIGHT WO CONTRAST  Addendum Date: 05/27/2020   ADDENDUM REPORT: 05/27/2020 09:41 ADDENDUM: Patient has a subtle torn labrum superiorly in the RIGHT hip joint noted on images 13-17 of series 11. Electronically Signed   By: Suzy Bouchard M.D.   On: 05/27/2020 09:41   Result Date: 05/27/2020 CLINICAL DATA:  FALL 1 WEEK PRIOR. SWELLING OF THE RIGHT HIP. LOW HEMOGLOBIN. NORMAL WHITE BLOOD CELL COUNT EXAM: MR OF THE RIGHT HIP WITHOUT CONTRAST TECHNIQUE: Multiplanar, multisequence MR imaging was performed. No intravenous contrast was administered. COMPARISON:  None. FINDINGS: Bones: No evidence of fracture of the RIGHT hip. No sacral fracture. Hips are located.  Articular cartilage and labrum Articular cartilage:  No acute findings Labrum:  No acute finding Joint or bursal effusion Joint effusion:  No effusion Bursae: No bursitis Muscles and tendons Muscles and tendons:  Unremarkable Other findings Miscellaneous: There is an ovoid mass within the subcutaneous tissue of the RIGHT hip overlying the greater trochanter measuring 13.0 x 4.5 by 5.5 cm (volume = 170 cm^3). There is edema within the subcutaneous tissue surrounding the mass. Findings are most consistent with hematoma associated with trauma. No evidence of abscess or infection. IMPRESSION: 1. Large superficial subcutaneous hematoma overlying the RIGHT hip. 2. No evidence of abscess. 3. No RIGHT hip fracture or infection. Electronically Signed: By: Suzy Bouchard M.D. On: 05/27/2020 04:52   ECHOCARDIOGRAM LIMITED  Result Date: 05/09/2020    ECHOCARDIOGRAM LIMITED REPORT  Patient Name:   Brandy Houston Date of Exam: 05/09/2020 Medical Rec #:  [id number]     Height:       60.0 in Accession #:    [id number]    Weight:       135.0 lb Date of Birth:  1955/06/17     BSA:          1.579 m Patient Age:    34 years      BP:           140/62 mmHg Patient Gender: F             HR:           79 bpm. Exam Location:  [address] Procedure: Limited Echo, Cardiac Doppler and Limited Color Doppler Indications:    I31.3 Pericardial effusion. LIMITED follow up pericardial                 effusion.  History:        Patient has prior history of Echocardiogram examinations, most                 recent 03/28/2020. CHF; Risk Factors:Hypertension, Diabetes and                 Current Smoker. ESRD. Hep C. Anemia.  Sonographer:    Jessee Avers, RDCS Referring Phys: [id number] [place]  1. Left ventricular ejection fraction, by estimation, is 40 to 45%. The left ventricle has mildly decreased function. The left ventricle demonstrates global hypokinesis. The left ventricular internal cavity size was mildly dilated.  There is mild left ventricular hypertrophy. Left ventricular diastolic parameters are consistent with Grade II diastolic dysfunction (pseudonormalization). Elevated left atrial pressure.  2. Right ventricular systolic function is normal. The right ventricular size is normal. There is severely elevated pulmonary artery systolic pressure. The estimated right ventricular systolic pressure is 45.8 mmHg.  3. The mitral valve is normal in structure. Trivial mitral valve regurgitation.  4. Tricuspid valve regurgitation is moderate.  5. Aortic valve regurgitation is mild to moderate. Moderate aortic valve stenosis. Vmax 2.9 m/s, MG 18 mmHg, AVA 1.5 cm^2, DI 0.49  6. Aortic dilatation noted. There is mild dilatation of the ascending aorta, measuring 36 mm.  7. The inferior vena cava is dilated in size with <50% respiratory variability, suggesting right atrial pressure of 15 mmHg.  8. Moderate pericardial effusion measuring up to 1.3cm adjacent to right atrium and 1.0 cm adjacent to LV lateral wall. No evidence of tamponade. While IVC is fixed/dilated, no RV/RA collapse or significant mitral inflow respiratory variation is seen. Comparison(s): 03/28/20 EF 30-35%. Moderate pericardial effusion. FINDINGS  Left Ventricle: Left ventricular ejection fraction, by estimation, is 40 to 45%. The left ventricle has mildly decreased function. The left ventricle demonstrates global hypokinesis. The left ventricular internal cavity size was mildly dilated. There is  mild left ventricular hypertrophy. Left ventricular diastolic parameters are consistent with Grade II diastolic dysfunction (pseudonormalization). Elevated left atrial pressure. Right Ventricle: The right ventricular size is normal. Right vetricular wall thickness was not assessed. Right ventricular systolic function is normal. There is severely elevated pulmonary artery systolic pressure. The tricuspid regurgitant velocity is 3.93 m/s, and with an assumed right atrial  pressure of 15 mmHg, the estimated right ventricular systolic pressure is 59.2 mmHg. Pericardium: A moderately sized pericardial effusion is present. Mitral Valve: The mitral valve is normal in structure. Trivial mitral valve regurgitation. MV peak gradient, 8.9 mmHg. The mean  mitral valve gradient is 4.0 mmHg. Tricuspid Valve: Tricuspid valve regurgitation is moderate. Aortic Valve: Aortic valve regurgitation is mild to moderate. Aortic regurgitation PHT measures 310 msec. Moderate aortic stenosis is present. Aortic valve mean gradient measures 18.0 mmHg. Aortic valve peak gradient measures 32.9 mmHg. Aortic valve area, by VTI measures 1.53 cm. Pulmonic Valve: The pulmonic valve was not well visualized. Pulmonic valve regurgitation is trivial. Aorta: The aortic root is normal in size and structure and aortic dilatation noted. There is mild dilatation of the ascending aorta, measuring 36 mm. Venous: The inferior vena cava is dilated in size with less than 50% respiratory variability, suggesting right atrial pressure of 15 mmHg. LEFT VENTRICLE PLAX 2D LVIDd:         5.30 cm  Diastology LVIDs:         4.00 cm  LV e' medial:    4.06 cm/s LV PW:         1.10 cm  LV E/e' medial:  36.2 LV IVS:        0.90 cm  LV e' lateral:   6.37 cm/s LVOT diam:     2.00 cm  LV E/e' lateral: 23.1 LV SV:         105 LV SV Index:   67 LVOT Area:     3.14 cm  RIGHT VENTRICLE RV S prime:     11.10 cm/s TAPSE (M-mode): 1.6 cm RVSP:           76.8 mmHg LEFT ATRIUM         Index      RIGHT ATRIUM LA diam:    5.20 cm 3.29 cm/m RA Pressure: 15.00 mmHg  AORTIC VALVE AV Area (Vmax):    1.59 cm AV Area (Vmean):   1.52 cm AV Area (VTI):     1.53 cm AV Vmax:           287.00 cm/s AV Vmean:          195.000 cm/s AV VTI:            0.688 m AV Peak Grad:      32.9 mmHg AV Mean Grad:      18.0 mmHg LVOT Vmax:         145.00 cm/s LVOT Vmean:        94.300 cm/s LVOT VTI:          0.335 m LVOT/AV VTI ratio: 0.49 AI PHT:            310 msec  AORTA Ao  Root diam: 2.90 cm Ao Asc diam:  3.60 cm MITRAL VALVE                TRICUSPID VALVE                             TR Peak grad:   61.8 mmHg MV Peak grad:  8.9 mmHg     TR Vmax:        393.00 cm/s MV Mean grad:  4.0 mmHg     Estimated RAP:  15.00 mmHg MV Vmax:       1.49 m/s     RVSP:           76.8 mmHg MV Vmean:      89.7 cm/s MV Decel Time: 165 msec     SHUNTS MV E velocity: 147.00 cm/s  Systemic VTI:  0.34 m MV A velocity: 106.00 cm/s  Systemic  Diam: 2.00 cm MV E/A ratio:  1.39 Oswaldo Milian MD Electronically signed by Oswaldo Milian MD Signature Date/Time: 05/09/2020/9:14:27 PM    Final        Subjective:  Patient seen and examined.  Tells me that she feels good, had watery stool this morning without any blood.  Does not want to get dialysis here.  She tells me that she would like to go home and go for dialysis tomorrow AM.  Discharge Exam: Vitals:   05/28/20 2118 05/29/20 0507  BP: 138/60 138/60  Pulse: 76 73  Resp: 16 16  Temp: 98 F (36.7 C) 98.5 F (36.9 C)  SpO2: 100% 93%   Vitals:   05/28/20 1130 05/28/20 1229 05/28/20 2118 05/29/20 0507  BP: (!) 143/64 (!) 126/58 138/60 138/60  Pulse: 81 78 76 73  Resp: '18  16 16  ' Temp: 98.2 F (36.8 C) 98.4 F (36.9 C) 98 F (36.7 C) 98.5 F (36.9 C)  TempSrc: Oral Oral Oral Oral  SpO2: 100% 96% 100% 93%  Weight:      Height:        General: Pt is alert, awake, not in acute distress, on room air, communicating well Cardiovascular: RRR, S1/S2 +, no rubs, no gallops Respiratory: CTA bilaterally, no wheezing, no rhonchi Abdominal: Soft, NT, ND, bowel sounds + Extremities: Left AKA, right hip: Firm swelling noted, nonerythematous, mild tender on palpation.   The results of significant diagnostics from this hospitalization (including imaging, microbiology, ancillary and laboratory) are listed below for reference.     Microbiology: Recent Results (from the past 240 hour(s))  Resp Panel by RT-PCR (Flu A&B, Covid)  Nasopharyngeal Swab     Status: None   Collection Time: 05/27/20  2:46 AM   Specimen: Nasopharyngeal Swab; Nasopharyngeal(NP) swabs in vial transport medium  Result Value Ref Range Status   SARS Coronavirus 2 by RT PCR NEGATIVE NEGATIVE Final    Comment: (NOTE) SARS-CoV-2 target nucleic acids are NOT DETECTED.  The SARS-CoV-2 RNA is generally detectable in upper respiratory specimens during the acute phase of infection. The lowest concentration of SARS-CoV-2 viral copies this assay can detect is 138 copies/mL. A negative result does not preclude SARS-Cov-2 infection and should not be used as the sole basis for treatment or other patient management decisions. A negative result may occur with  improper specimen collection/handling, submission of specimen other than nasopharyngeal swab, presence of viral mutation(s) within the areas targeted by this assay, and inadequate number of viral copies(<138 copies/mL). A negative result must be combined with clinical observations, patient history, and epidemiological information. The expected result is Negative.  Fact Sheet for Patients:  EntrepreneurPulse.com.au  Fact Sheet for Healthcare Providers:  IncredibleEmployment.be  This test is no t yet approved or cleared by the [place] FDA and  has been authorized for detection and/or diagnosis of SARS-CoV-2 by FDA under an Emergency Use Authorization (EUA). This EUA will remain  in effect (meaning this test can be used) for the duration of the COVID-19 declaration under Section 564(b)(1) of the Act, 21 U.S.C.section 360bbb-3(b)(1), unless the authorization is terminated  or revoked sooner.       Influenza A by PCR NEGATIVE NEGATIVE Final   Influenza B by PCR NEGATIVE NEGATIVE Final    Comment: (NOTE) The Xpert Xpress SARS-CoV-2/FLU/RSV plus assay is intended as an aid in the diagnosis of influenza from Nasopharyngeal swab specimens and should not be  used as a sole basis for treatment. Nasal washings and aspirates are unacceptable  for Xpert Xpress SARS-CoV-2/FLU/RSV testing.  Fact Sheet for Patients: EntrepreneurPulse.com.au  Fact Sheet for Healthcare Providers: IncredibleEmployment.be  This test is not yet approved or cleared by the [place] FDA and has been authorized for detection and/or diagnosis of SARS-CoV-2 by FDA under an Emergency Use Authorization (EUA). This EUA will remain in effect (meaning this test can be used) for the duration of the COVID-19 declaration under Section 564(b)(1) of the Act, 21 U.S.C. section 360bbb-3(b)(1), unless the authorization is terminated or revoked.  Performed at [facility], [place] [address] [place], [place] [address]   MRSA PCR Screening     Status: None   Collection Time: 05/27/20  5:30 PM   Specimen: Nasal Mucosa; Nasopharyngeal  Result Value Ref Range Status   MRSA by PCR NEGATIVE NEGATIVE Final    Comment:        The GeneXpert MRSA Assay (FDA approved for NASAL specimens only), is one component of a comprehensive MRSA colonization surveillance program. It is not intended to diagnose MRSA infection nor to guide or monitor treatment for MRSA infections. Performed at [facility], [place] [address] [place],  [address]      Labs: BNP (last 3 results) No results for input(s): BNP in the last 8760 hours. Basic Metabolic Panel: Recent Labs  Lab 05/26/20 1708 05/27/20 0313 05/28/20 0314 05/29/20 0115  NA 137 139 141 138  K 3.1* 3.6 3.7 3.4*  CL 93* 98 100 101  CO2 34* '28 28 28  ' GLUCOSE 130* 108* 121* 110*  BUN 13 24* 39* 43*  CREATININE 2.19* 3.15* 4.69* 5.48*  CALCIUM 7.9* 8.0* 9.2 8.9  MG  --   --  2.1  --    Liver Function Tests: Recent Labs  Lab 05/26/20 1708  AST 12*  ALT 19  ALKPHOS 69  BILITOT 0.6  PROT 6.0*  ALBUMIN 2.9*   No results for input(s): LIPASE, AMYLASE in the last 168 hours. No  results for input(s): AMMONIA in the last 168 hours. CBC: Recent Labs  Lab 05/26/20 1708 05/27/20 0313 05/27/20 1618 05/28/20 0314 05/28/20 1248 05/29/20 0115  WBC 5.4 5.3  --  6.7 5.9 5.6  HGB 6.1* 6.9* 8.8* 8.7* 8.2* 7.9*  HCT 19.5* 22.5* 28.3* 27.9* 25.5* 24.7*  MCV 98.0 97.4  --  94.6 93.8 95.0  PLT 206 177  --  175 181 178   Cardiac Enzymes: No results for input(s): CKTOTAL, CKMB, CKMBINDEX, TROPONINI in the last 168 hours. BNP: Invalid input(s): POCBNP CBG: No results for input(s): GLUCAP in the last 168 hours. D-Dimer No results for input(s): DDIMER in the last 72 hours. Hgb A1c No results for input(s): HGBA1C in the last 72 hours. Lipid Profile No results for input(s): CHOL, HDL, LDLCALC, TRIG, CHOLHDL, LDLDIRECT in the last 72 hours. Thyroid function studies No results for input(s): TSH, T4TOTAL, T3FREE, THYROIDAB in the last 72 hours.  Invalid input(s): FREET3 Anemia work up No results for input(s): VITAMINB12, FOLATE, FERRITIN, TIBC, IRON, RETICCTPCT in the last 72 hours. Urinalysis    Component Value Date/Time   COLORURINE AMBER (A) 02/08/2017 1138   APPEARANCEUR TURBID (A) 02/08/2017 1138   LABSPEC 1.014 02/08/2017 1138   PHURINE 5.0 02/08/2017 1138   GLUCOSEU NEGATIVE 02/08/2017 1138   HGBUR MODERATE (A) 02/08/2017 1138   BILIRUBINUR NEGATIVE 02/08/2017 1138   KETONESUR NEGATIVE 02/08/2017 1138   PROTEINUR 100 (A) 02/08/2017 1138   UROBILINOGEN 0.2 09/02/2014 1504   NITRITE NEGATIVE 02/08/2017 1138  LEUKOCYTESUR LARGE (A) 02/08/2017 1138   Sepsis Labs Invalid input(s): PROCALCITONIN,  WBC,  LACTICIDVEN Microbiology Recent Results (from the past 240 hour(s))  Resp Panel by RT-PCR (Flu A&B, Covid) Nasopharyngeal Swab     Status: None   Collection Time: 05/27/20  2:46 AM   Specimen: Nasopharyngeal Swab; Nasopharyngeal(NP) swabs in vial transport medium  Result Value Ref Range Status   SARS Coronavirus 2 by RT PCR NEGATIVE NEGATIVE Final     Comment: (NOTE) SARS-CoV-2 target nucleic acids are NOT DETECTED.  The SARS-CoV-2 RNA is generally detectable in upper respiratory specimens during the acute phase of infection. The lowest concentration of SARS-CoV-2 viral copies this assay can detect is 138 copies/mL. A negative result does not preclude SARS-Cov-2 infection and should not be used as the sole basis for treatment or other patient management decisions. A negative result may occur with  improper specimen collection/handling, submission of specimen other than nasopharyngeal swab, presence of viral mutation(s) within the areas targeted by this assay, and inadequate number of viral copies(<138 copies/mL). A negative result must be combined with clinical observations, patient history, and epidemiological information. The expected result is Negative.  Fact Sheet for Patients:  EntrepreneurPulse.com.au  Fact Sheet for Healthcare Providers:  IncredibleEmployment.be  This test is no t yet approved or cleared by the [place] FDA and  has been authorized for detection and/or diagnosis of SARS-CoV-2 by FDA under an Emergency Use Authorization (EUA). This EUA will remain  in effect (meaning this test can be used) for the duration of the COVID-19 declaration under Section 564(b)(1) of the Act, 21 U.S.C.section 360bbb-3(b)(1), unless the authorization is terminated  or revoked sooner.       Influenza A by PCR NEGATIVE NEGATIVE Final   Influenza B by PCR NEGATIVE NEGATIVE Final    Comment: (NOTE) The Xpert Xpress SARS-CoV-2/FLU/RSV plus assay is intended as an aid in the diagnosis of influenza from Nasopharyngeal swab specimens and should not be used as a sole basis for treatment. Nasal washings and aspirates are unacceptable for Xpert Xpress SARS-CoV-2/FLU/RSV testing.  Fact Sheet for Patients: EntrepreneurPulse.com.au  Fact Sheet for Healthcare  Providers: IncredibleEmployment.be  This test is not yet approved or cleared by the [place] FDA and has been authorized for detection and/or diagnosis of SARS-CoV-2 by FDA under an Emergency Use Authorization (EUA). This EUA will remain in effect (meaning this test can be used) for the duration of the COVID-19 declaration under Section 564(b)(1) of the Act, 21 U.S.C. section 360bbb-3(b)(1), unless the authorization is terminated or revoked.  Performed at [facility], [place] [address], [place], [place] [address]   MRSA PCR Screening     Status: None   Collection Time: 05/27/20  5:30 PM   Specimen: Nasal Mucosa; Nasopharyngeal  Result Value Ref Range Status   MRSA by PCR NEGATIVE NEGATIVE Final    Comment:        The GeneXpert MRSA Assay (FDA approved for NASAL specimens only), is one component of a comprehensive MRSA colonization surveillance program. It is not intended to diagnose MRSA infection nor to guide or monitor treatment for MRSA infections. Performed at [facility], [place] [address] [place], [place] [address]      Time coordinating discharge: Over 30 minutes  SIGNED:   Mckinley Jewel, MD  [facility] 05/29/2020, 11:28 AM Pager   If 7PM-7AM, please contact night-coverage [email or web address]

## 2020-05-29 NOTE — Progress Notes (Signed)
Wasted 2 mL of dilaudid with Lavonda Jumbo witnessing at [facility] on 05/29/2020.

## 2020-05-29 NOTE — Progress Notes (Signed)
Brandy Houston to be D/C'd  per MD order. Discussed with the patient and all questions fully answered.  VSS, Skin clean, dry and intact without evidence of skin break down, no evidence of skin tears noted.  IV catheter discontinued intact. Site without signs and symptoms of complications. Dressing and pressure applied.  An After Visit Summary was printed and given to the patient. Patient prescriptions called in to pharmacy.  D/c education completed with patient including follow up instructions, medication list, d/c activities limitations if indicated, with other d/c instructions as indicated by MD - patient able to verbalize understanding, all questions fully answered.   Patient instructed to return to ED, call 911, or call MD for any changes in condition.   Patient to be escorted via [place], and D/C home via private auto.  Pt to do dialysis at her regular facility 05/30/2020.

## 2020-05-30 NOTE — Telephone Encounter (Signed)
Transition of care contact from inpatient facility  Date of discharge: 05/29/20 Date of contact: 05/30/20 Method: Phone Spoke to: Patient  Patient contacted to discuss transition of care from recent inpatient hospitalization. Patient was admitted to [facility] from 05/26/20 to 05/29/20... with discharge diagnosis of  Acute Upper Gi Bleed / Rhip Hematoma  Medication changes were reviewed.  Patient will follow up with his/her outpatient HD unit on: Thursday 05/31/20

## 2020-05-31 ENCOUNTER — Telehealth: Payer: Self-pay

## 2020-05-31 DIAGNOSIS — N186 End stage renal disease: Secondary | ICD-10-CM | POA: Diagnosis not present

## 2020-05-31 DIAGNOSIS — D631 Anemia in chronic kidney disease: Secondary | ICD-10-CM | POA: Diagnosis not present

## 2020-05-31 DIAGNOSIS — N2581 Secondary hyperparathyroidism of renal origin: Secondary | ICD-10-CM | POA: Diagnosis not present

## 2020-05-31 DIAGNOSIS — E1129 Type 2 diabetes mellitus with other diabetic kidney complication: Secondary | ICD-10-CM | POA: Diagnosis not present

## 2020-05-31 DIAGNOSIS — D509 Iron deficiency anemia, unspecified: Secondary | ICD-10-CM | POA: Diagnosis not present

## 2020-05-31 DIAGNOSIS — Z992 Dependence on renal dialysis: Secondary | ICD-10-CM | POA: Diagnosis not present

## 2020-05-31 NOTE — Telephone Encounter (Cosign Needed)
Transition Care Management Follow-up Telephone Call  Date of discharge and from where: 05/29/20 from [facility]  How have you been since you were released from the hospital? ok  Any questions or concerns? No  Items Reviewed:  Did the pt receive and understand the discharge instructions provided? Yes   Medications obtained and verified? Yes   Other? No   Any new allergies since your discharge? Yes   Dietary orders reviewed? Yes  Do you have support at home? Yes   Home Care and Equipment/Supplies: Were home health services ordered? not applicable If so, what is the name of the agency?   Has the agency set up a time to come to the patient's home? not applicable Were any new equipment or medical supplies ordered?  No What is the name of the medical supply agency?  Were you able to get the supplies/equipment? not applicable Do you have any questions related to the use of the equipment or supplies? No  Functional Questionnaire: (I = Independent and D = Dependent) ADLs: I  Bathing/Dressing- I  Meal Prep- I  Eating- I  Maintaining continence- I  Transferring/Ambulation- I  Managing Meds- I  Follow up appointments reviewed:   PCP Hospital f/u appt confirmed? Yes  Scheduled to see Dr Jonni Sanger  on 06/18/20 @ 4:30.  [facility] f/u appt confirmed? No pt sts  Are transportation arrangements needed? No   If their condition worsens, is the pt aware to call PCP or go to the Emergency Dept.? Yes  Was the patient provided with contact information for the PCP's office or ED? Yes  Was to pt encouraged to call back with questions or concerns? Yes

## 2020-06-02 DIAGNOSIS — D631 Anemia in chronic kidney disease: Secondary | ICD-10-CM | POA: Diagnosis not present

## 2020-06-02 DIAGNOSIS — Z992 Dependence on renal dialysis: Secondary | ICD-10-CM | POA: Diagnosis not present

## 2020-06-02 DIAGNOSIS — N2581 Secondary hyperparathyroidism of renal origin: Secondary | ICD-10-CM | POA: Diagnosis not present

## 2020-06-02 DIAGNOSIS — E1129 Type 2 diabetes mellitus with other diabetic kidney complication: Secondary | ICD-10-CM | POA: Diagnosis not present

## 2020-06-02 DIAGNOSIS — N186 End stage renal disease: Secondary | ICD-10-CM | POA: Diagnosis not present

## 2020-06-02 DIAGNOSIS — D509 Iron deficiency anemia, unspecified: Secondary | ICD-10-CM | POA: Diagnosis not present

## 2020-06-05 DIAGNOSIS — E1129 Type 2 diabetes mellitus with other diabetic kidney complication: Secondary | ICD-10-CM | POA: Diagnosis not present

## 2020-06-05 DIAGNOSIS — D631 Anemia in chronic kidney disease: Secondary | ICD-10-CM | POA: Diagnosis not present

## 2020-06-05 DIAGNOSIS — D509 Iron deficiency anemia, unspecified: Secondary | ICD-10-CM | POA: Diagnosis not present

## 2020-06-05 DIAGNOSIS — N186 End stage renal disease: Secondary | ICD-10-CM | POA: Diagnosis not present

## 2020-06-05 DIAGNOSIS — Z992 Dependence on renal dialysis: Secondary | ICD-10-CM | POA: Diagnosis not present

## 2020-06-05 DIAGNOSIS — N2581 Secondary hyperparathyroidism of renal origin: Secondary | ICD-10-CM | POA: Diagnosis not present

## 2020-06-07 DIAGNOSIS — Z992 Dependence on renal dialysis: Secondary | ICD-10-CM | POA: Diagnosis not present

## 2020-06-07 DIAGNOSIS — N186 End stage renal disease: Secondary | ICD-10-CM | POA: Diagnosis not present

## 2020-06-07 DIAGNOSIS — E1129 Type 2 diabetes mellitus with other diabetic kidney complication: Secondary | ICD-10-CM | POA: Diagnosis not present

## 2020-06-07 DIAGNOSIS — D509 Iron deficiency anemia, unspecified: Secondary | ICD-10-CM | POA: Diagnosis not present

## 2020-06-07 DIAGNOSIS — N2581 Secondary hyperparathyroidism of renal origin: Secondary | ICD-10-CM | POA: Diagnosis not present

## 2020-06-07 DIAGNOSIS — D631 Anemia in chronic kidney disease: Secondary | ICD-10-CM | POA: Diagnosis not present

## 2020-06-12 DIAGNOSIS — N186 End stage renal disease: Secondary | ICD-10-CM | POA: Diagnosis not present

## 2020-06-12 DIAGNOSIS — D509 Iron deficiency anemia, unspecified: Secondary | ICD-10-CM | POA: Diagnosis not present

## 2020-06-12 DIAGNOSIS — D631 Anemia in chronic kidney disease: Secondary | ICD-10-CM | POA: Diagnosis not present

## 2020-06-12 DIAGNOSIS — N2581 Secondary hyperparathyroidism of renal origin: Secondary | ICD-10-CM | POA: Diagnosis not present

## 2020-06-12 DIAGNOSIS — Z992 Dependence on renal dialysis: Secondary | ICD-10-CM | POA: Diagnosis not present

## 2020-06-12 DIAGNOSIS — E1129 Type 2 diabetes mellitus with other diabetic kidney complication: Secondary | ICD-10-CM | POA: Diagnosis not present

## 2020-06-13 ENCOUNTER — Ambulatory Visit (INDEPENDENT_AMBULATORY_CARE_PROVIDER_SITE_OTHER): Payer: Medicare Other | Admitting: Family Medicine

## 2020-06-13 ENCOUNTER — Encounter: Payer: Self-pay | Admitting: Family Medicine

## 2020-06-13 ENCOUNTER — Other Ambulatory Visit: Payer: Self-pay

## 2020-06-13 VITALS — BP 134/66 | HR 83 | Temp 98.2°F | Resp 18 | Ht 67.0 in | Wt 135.2 lb

## 2020-06-13 DIAGNOSIS — E1122 Type 2 diabetes mellitus with diabetic chronic kidney disease: Secondary | ICD-10-CM | POA: Diagnosis not present

## 2020-06-13 DIAGNOSIS — I5032 Chronic diastolic (congestive) heart failure: Secondary | ICD-10-CM | POA: Diagnosis not present

## 2020-06-13 DIAGNOSIS — I25118 Atherosclerotic heart disease of native coronary artery with other forms of angina pectoris: Secondary | ICD-10-CM | POA: Diagnosis not present

## 2020-06-13 DIAGNOSIS — F339 Major depressive disorder, recurrent, unspecified: Secondary | ICD-10-CM | POA: Diagnosis not present

## 2020-06-13 DIAGNOSIS — N186 End stage renal disease: Secondary | ICD-10-CM | POA: Diagnosis not present

## 2020-06-13 DIAGNOSIS — Z992 Dependence on renal dialysis: Secondary | ICD-10-CM

## 2020-06-13 DIAGNOSIS — I351 Nonrheumatic aortic (valve) insufficiency: Secondary | ICD-10-CM

## 2020-06-13 DIAGNOSIS — F32A Depression, unspecified: Secondary | ICD-10-CM | POA: Diagnosis not present

## 2020-06-13 DIAGNOSIS — I10 Essential (primary) hypertension: Secondary | ICD-10-CM

## 2020-06-13 DIAGNOSIS — D500 Iron deficiency anemia secondary to blood loss (chronic): Secondary | ICD-10-CM

## 2020-06-13 DIAGNOSIS — I361 Nonrheumatic tricuspid (valve) insufficiency: Secondary | ICD-10-CM

## 2020-06-13 DIAGNOSIS — F17210 Nicotine dependence, cigarettes, uncomplicated: Secondary | ICD-10-CM | POA: Diagnosis not present

## 2020-06-13 DIAGNOSIS — K5521 Angiodysplasia of colon with hemorrhage: Secondary | ICD-10-CM | POA: Diagnosis not present

## 2020-06-13 DIAGNOSIS — D631 Anemia in chronic kidney disease: Secondary | ICD-10-CM

## 2020-06-13 MED ORDER — SERTRALINE HCL 100 MG PO TABS
200.0000 mg | ORAL_TABLET | Freq: Every day | ORAL | 3 refills | Status: DC
Start: 2020-06-13 — End: 2020-10-07

## 2020-06-13 MED ORDER — ATORVASTATIN CALCIUM 10 MG PO TABS: 10.0000 mg | ORAL_TABLET | Freq: Every day | ORAL | Status: AC

## 2020-06-13 NOTE — Progress Notes (Signed)
Subjective  CC:  Chief Complaint  Patient presents with  . Hospitalization Follow-up    Patient mentioned that she is still having issues with her throat and would like her antibiotic refilled.     HPI: Brandy Houston is a 64 y.o. female who presents to the office today for follow up of diabetes and problems listed above in the chief complaint.   Hospital follow-up, admitted December 11 through December 14.  Upper enteroscopy done on December 13 with cauterization of AVMs.  I reviewed hospital admission and discharge summaries and consultations from gastroenterology and nephrology.  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  She was transfused 1 unit packed red blood cells.  Upon discharge her hemoglobin was 9.0.  Her upper endoscopy revealed AVMs, gastritis and candidiasis of the esophagus.  She was sent out on appropriate medications.  Since she is doing well.  Her most recent hemoglobin was 10.2 at dialysis.  She is no longer feeling fatigued.  She is not having chest pain.  She intermittently will have a sore throat but she did complete 10 days of Diflucan.  She denies bright red blood per rectum.  She does have dark stools because of her iron.  Reviewed hospital MRI due to hip pain and fall.  She has a large right-sided hematoma.  She will see orthopedics next week.  Diabetes follow up: Her diabetic control is reported as Unchanged.  In May we stopped her Lantus due to low blood sugars.  Most recent A1c is normal.  End-stage renal disease on dialysis without complications at this time.  She reports she is at her dry weight.  Tolerating dialysis well.  Hypertension with variable readings.  She uses her medications as directed by cardiology.  Coronary disease with intermittent angina, nonsurgical candidate.  Medical management only.  Reviewed recent cardiology notes.  Being treated for chronic heart failure with significant aortic and  tricuspid regurg.  Volume status is important.  She denies chest pain currently.  Depression: She reports the stressors of being sick over the last 6 months is wearing on her.  Mood is low.  She is on sertraline 100 mg daily.  She has been on sertraline for years.  No suicidal ideation.  Sleeping okay on trazodone.   Wt Readings from Last 3 Encounters:  06/13/20 135 lb 3.2 oz (61.3 kg)  05/26/20 130 lb (59 kg)  05/25/20 135 lb (61.2 kg)    BP Readings from Last 3 Encounters:  06/13/20 134/66  05/29/20 (!) 154/77  05/25/20 110/66    Assessment  1. Anemia, blood loss   2. Anemia in chronic kidney disease, on chronic dialysis ([place])   3. AVM (arteriovenous malformation) of small bowel, acquired with hemorrhage   4. Benign essential HTN   5. DM type 2 causing ESRD ([place])   6. Nicotine dependence, cigarettes, uncomplicated   7. Depression   8. Chronic diastolic CHF (congestive heart failure) ([place])   9. Major depression, recurrent, chronic ([place])   10. Coronary artery disease of native artery of native heart with stable angina pectoris ([place])   11. Nonrheumatic aortic valve insufficiency   12. Nonrheumatic tricuspid valve regurgitation      Plan    Anemia due to blood loss, GI related status post intervention: Stabilized.  Continue iron.  Esophageal candidiasis status post treatment.  Will follow with GI to see if anything further is needed.  Chronic kidney disease on dialysis.  Diabetes is currently resolved .  We will continue to monitor.  Not currently on diabetic medications.  Smoker and precontemplation about quitting.  Coronary artery disease medical management only  Hypertension controlled  Hyperlipidemia at goal  Heart failure stable and managed with volume control through dialysis  Depression: Active increase sertraline to 200 mg daily.   Follow up: Recheck 6 months. No orders of the defined types were placed in this encounter.  Meds ordered this encounter   Medications  . atorvastatin (LIPITOR) 10 MG tablet    Sig: Take 1 tablet (10 mg total) by mouth at bedtime.  . sertraline (ZOLOFT) 100 MG tablet    Sig: Take 2 tablets (200 mg total) by mouth daily.    Dispense:  180 tablet    Refill:  3    Increasing dose from 100 daily.      Immunization History  Administered Date(s) Administered  . Fluad Quad(high Dose 65+) 02/28/2019  . Hepatitis B, adult 10/18/2014, 11/07/2014, 12/12/2014, 05/27/2018, 06/24/2018, 07/29/2018, 12/23/2018, 01/27/2019, 02/24/2019, 03/31/2019, 07/28/2019  . Hepatitis B, ped/adol 11/07/2014, 12/12/2014  . Influenza Inj Mdck Quad Pf 08/08/2014  . Influenza, High Dose Seasonal PF 04/26/2020  . Influenza, Seasonal, Injecte, Preservative Fre 02/08/2015, 02/13/2016  . Influenza,inj,Quad PF,6+ Mos 08/08/2014, 04/01/2017  . Influenza-Unspecified 08/08/2014, 01/14/2018  . Moderna Sars-Covid-2 Vaccination 08/11/2019, 09/08/2019  . PFIZER SARS-COV-2 Vaccination 03/15/2020  . PPD Test 11/13/2015  . Pneumococcal Conjugate-13 10/31/2019  . Pneumococcal Polysaccharide-23 08/08/2014  . Pneumococcal-Unspecified 08/08/2014  . Tdap 08/25/2016    Diabetes Related Lab Review: Lab Results  Component Value Date   HGBA1C 4.6 (L) 04/14/2020   HGBA1C 4.8 10/31/2019   HGBA1C 4.4 (L) 10/22/2019    Lab Results  Component Value Date   MICROALBUR 266.7 (H) 11/14/2014   Lab Results  Component Value Date   CREATININE 5.48 (H) 05/29/2020   BUN 43 (H) 05/29/2020   NA 138 05/29/2020   K 3.4 (L) 05/29/2020   CL 101 05/29/2020   CO2 28 05/29/2020   Lab Results  Component Value Date   CHOL 122 04/16/2020   CHOL 111 12/10/2018   CHOL 121 11/25/2017   Lab Results  Component Value Date   HDL 83 04/16/2020   HDL 61.40 12/10/2018   HDL 47.70 11/25/2017   Lab Results  Component Value Date   LDLCALC 23 04/16/2020   LDLCALC 33 12/10/2018   LDLCALC 57 11/25/2017   Lab Results  Component Value Date   TRIG 82 04/16/2020   TRIG  82.0 12/10/2018   TRIG 79.0 11/25/2017   Lab Results  Component Value Date   CHOLHDL 1.5 04/16/2020   CHOLHDL 2 12/10/2018   CHOLHDL 3 11/25/2017   No results found for: LDLDIRECT The ASCVD Risk score Mikey Bussing DC Jr., et al., 2013) failed to calculate for the following reasons:   The valid total cholesterol range is 130 to 320 mg/dL I have reviewed the PMH, Fam and Soc history. Patient Active Problem List   Diagnosis Date Noted  . End stage renal disease ([place]) 04/19/2018    Priority: High  . Secondary hyperparathyroidism of renal origin ([place]) 04/19/2018    Priority: High  . Nicotine dependence, cigarettes, uncomplicated 26/83/4196    Priority: High  . Benign essential HTN     Priority: High  . Above knee amputation status, left     Priority: High  . DM type 2 causing ESRD ([place]) 03/21/2017    Priority: High  . Anemia in chronic kidney disease 02/05/2017  Priority: High  . Chronic diastolic CHF (congestive heart failure) ([place]) 11/08/2015    Priority: High  . Diabetic peripheral neuropathy ([name])     Priority: High  . Chronic kidney disease (CKD), stage IV (severe) ([name])     Priority: High  . Anemia, blood loss     Priority: Medium  . Major depression, recurrent, chronic ([name])     Priority: Medium  . Hepatitis C 08/16/2014    Priority: Medium  . Coronary artery disease of native artery of native heart with stable angina pectoris ([place]) 06/13/2020  . Nonrheumatic aortic valve insufficiency 06/13/2020  . Nonrheumatic tricuspid valve regurgitation 06/13/2020  . GAVE (gastric antral vascular ectasia)   . AVM (arteriovenous malformation) of small bowel, acquired with hemorrhage   . Hip swelling, right 05/27/2020  . Hypokalemia 05/27/2020  . Anemia of chronic renal failure 05/27/2020  . 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 Acute combined systolic and diastolic heart failure ([place])   . Unstable angina ([place])   . Symptomatic anemia 01/27/2020  .  Hypercalcemia 10/06/2019  . QT prolongation 06/05/2019  . Moderate protein-calorie malnutrition ([place]) 05/11/2018  . Iron deficiency anemia, unspecified 04/19/2018  . Nephrotic syndrome with unspecified morphologic changes 04/19/2018  . Peripheral neuropathy 06/24/2013    Overview:  Noted to be idiopathic in PCP notes     Social History: Patient  reports that she has been smoking cigarettes. She has a 17.50 pack-year smoking history. She has never used smokeless tobacco. She reports previous alcohol use. She reports that she does not use drugs.  Review of Systems: Ophthalmic: negative for eye pain, loss of vision or double vision Cardiovascular: negative for chest pain Respiratory: negative for SOB or persistent cough Gastrointestinal: negative for abdominal pain Genitourinary: negative for dysuria or gross hematuria MSK: negative for foot lesions Neurologic: negative for weakness or gait disturbance  Objective  Vitals: BP 134/66   Pulse 83   Temp 98.2 F (36.8 C) (Temporal)   Resp 18   Ht 5\' 7"  (1.702 m)   Wt 135 lb 3.2 oz (61.3 kg)   LMP 03/16/2002   SpO2 97%   BMI 21.18 kg/m  General: well appearing, no acute distress  Psych:  Alert and oriented, normal mood and affect HEENT:  Normocephalic, atraumatic, moist mucous membranes, supple neck  Cardiovascular:  Nl S1 and S2, RRR murmurs present  respiratory:  Good breath sounds bilaterally, CTAB with normal effort, no rales Gastrointestinal: normal BS, soft, nontender Skin:  Warm, no rashes Neurologic:   Mental status is normal. normal gait Diabetic Foot Exam - Simple right foot only, status post left AKA  Simple Foot Form Diabetic Foot exam was performed with the following findings: Yes 06/13/2020 11:20 AM  Visual Inspection No deformities, no ulcerations, no other skin breakdown bilaterally: Yes Sensation Testing Intact to touch and monofilament testing bilaterally: Yes Pulse Check See comments: Yes Comments Tr  pedal pulses        Diabetic education: ongoing education regarding chronic disease management for diabetes was given today. We continue to reinforce the ABC's of diabetic management: A1c (<7 or 8 dependent upon patient), tight blood pressure control, and cholesterol management with goal LDL < 100 minimally. We discuss diet strategies, exercise recommendations, medication options and possible side effects. At each visit, we review recommended immunizations and preventive care recommendations for diabetics and stress that good diabetic control can prevent other problems. See below for this patient's data.    Commons side  effects, risks, benefits, and alternatives for medications and treatment plan prescribed today were discussed, and the patient expressed understanding of the given instructions. Patient is instructed to call or message via MyChart if he/she has any questions or concerns regarding our treatment plan. No barriers to understanding were identified. We discussed Red Flag symptoms and signs in detail. Patient expressed understanding regarding what to do in case of urgent or emergency type symptoms.   Medication list was reconciled, printed and provided to the patient in AVS. Patient instructions and summary information was reviewed with the patient as documented in the AVS. This note was prepared with assistance of Dragon voice recognition software. Occasional wrong-word or sound-a-like substitutions may have occurred due to the inherent limitations of voice recognition software  This visit occurred during the SARS-CoV-2 public health emergency.  Safety protocols were in place, including screening questions prior to the visit, additional usage of staff PPE, and extensive cleaning of exam room while observing appropriate contact time as indicated for disinfecting solutions.

## 2020-06-13 NOTE — Patient Instructions (Signed)
Please return in 6 months for recheck. Come fasting to that appointment.   I've increased your dose of sertraline to 200mg  daily.   If you have any questions or concerns, please don't hesitate to send me a message via MyChart or call the office at [phone]. Thank you for visiting with [place] today! It's our pleasure caring for you.

## 2020-06-14 DIAGNOSIS — Z992 Dependence on renal dialysis: Secondary | ICD-10-CM | POA: Diagnosis not present

## 2020-06-14 DIAGNOSIS — N2581 Secondary hyperparathyroidism of renal origin: Secondary | ICD-10-CM | POA: Diagnosis not present

## 2020-06-14 DIAGNOSIS — N186 End stage renal disease: Secondary | ICD-10-CM | POA: Diagnosis not present

## 2020-06-14 DIAGNOSIS — D631 Anemia in chronic kidney disease: Secondary | ICD-10-CM | POA: Diagnosis not present

## 2020-06-14 DIAGNOSIS — D509 Iron deficiency anemia, unspecified: Secondary | ICD-10-CM | POA: Diagnosis not present

## 2020-06-14 DIAGNOSIS — E1129 Type 2 diabetes mellitus with other diabetic kidney complication: Secondary | ICD-10-CM | POA: Diagnosis not present

## 2020-06-16 DIAGNOSIS — Z992 Dependence on renal dialysis: Secondary | ICD-10-CM | POA: Diagnosis not present

## 2020-06-16 DIAGNOSIS — N186 End stage renal disease: Secondary | ICD-10-CM | POA: Diagnosis not present

## 2020-06-16 DIAGNOSIS — E1129 Type 2 diabetes mellitus with other diabetic kidney complication: Secondary | ICD-10-CM | POA: Diagnosis not present

## 2020-06-17 DIAGNOSIS — E1129 Type 2 diabetes mellitus with other diabetic kidney complication: Secondary | ICD-10-CM | POA: Diagnosis not present

## 2020-06-17 DIAGNOSIS — N2581 Secondary hyperparathyroidism of renal origin: Secondary | ICD-10-CM | POA: Diagnosis not present

## 2020-06-17 DIAGNOSIS — D631 Anemia in chronic kidney disease: Secondary | ICD-10-CM | POA: Diagnosis not present

## 2020-06-17 DIAGNOSIS — Z992 Dependence on renal dialysis: Secondary | ICD-10-CM | POA: Diagnosis not present

## 2020-06-17 DIAGNOSIS — D509 Iron deficiency anemia, unspecified: Secondary | ICD-10-CM | POA: Diagnosis not present

## 2020-06-17 DIAGNOSIS — N186 End stage renal disease: Secondary | ICD-10-CM | POA: Diagnosis not present

## 2020-06-18 ENCOUNTER — Telehealth: Payer: Self-pay | Admitting: Gastroenterology

## 2020-06-18 ENCOUNTER — Ambulatory Visit: Payer: Medicare Other | Admitting: Family Medicine

## 2020-06-18 NOTE — Telephone Encounter (Signed)
Inbound call from patient stating she is currently have some bleeding and wants to know what to do about it.  Please advise.

## 2020-06-18 NOTE — Telephone Encounter (Signed)
Spoke with the patient. She is noticing bright red blood with her bowel movement. This has happened every time for the past 3 or 4 days. She states she is not constipated and has multiple bowel movements in a day. She does not have any bleeding at any other time. She is on oral iron. Her stools are always dark. Her stomach hurts. She states this is chronic as well. No rectal pain. She does not feel her hemorrhoids are irritated or inflamed. She does feel more than her usual tired. Offered labs today. Declines. Offered an appointment tomorrow. Declines due to dialysis. She would like to wait for Dr Silverio Decamp to review and advise.

## 2020-06-19 ENCOUNTER — Ambulatory Visit: Payer: Medicare Other | Admitting: Gastroenterology

## 2020-06-19 DIAGNOSIS — Z992 Dependence on renal dialysis: Secondary | ICD-10-CM | POA: Diagnosis not present

## 2020-06-19 DIAGNOSIS — E1129 Type 2 diabetes mellitus with other diabetic kidney complication: Secondary | ICD-10-CM | POA: Diagnosis not present

## 2020-06-19 DIAGNOSIS — D631 Anemia in chronic kidney disease: Secondary | ICD-10-CM | POA: Diagnosis not present

## 2020-06-19 DIAGNOSIS — N186 End stage renal disease: Secondary | ICD-10-CM | POA: Diagnosis not present

## 2020-06-19 DIAGNOSIS — D509 Iron deficiency anemia, unspecified: Secondary | ICD-10-CM | POA: Diagnosis not present

## 2020-06-19 DIAGNOSIS — N2581 Secondary hyperparathyroidism of renal origin: Secondary | ICD-10-CM | POA: Diagnosis not present

## 2020-06-19 NOTE — Telephone Encounter (Signed)
Called the patient back. Left her a message to return my call to discuss the recommendations.

## 2020-06-19 NOTE — Telephone Encounter (Signed)
She can likely have labs drawn at dialysis tomorrow for CBC. She should also have iron panel checked and given IV iron if is deficient.  Please send Rx for Anusol suppository at bedtime daily X 7 days. Add benefiber 1 tablespoon TID with meals.

## 2020-06-20 ENCOUNTER — Encounter (HOSPITAL_COMMUNITY): Payer: Self-pay

## 2020-06-20 ENCOUNTER — Other Ambulatory Visit: Payer: Self-pay

## 2020-06-20 ENCOUNTER — Inpatient Hospital Stay (HOSPITAL_COMMUNITY)
Admission: EM | Admit: 2020-06-20 | Discharge: 2020-06-25 | DRG: 377 | Disposition: A | Discharge status: Home / Self Care | Payer: Medicare Other | Attending: Family Medicine | Admitting: Family Medicine

## 2020-06-20 DIAGNOSIS — D631 Anemia in chronic kidney disease: Secondary | ICD-10-CM | POA: Diagnosis present

## 2020-06-20 DIAGNOSIS — I5032 Chronic diastolic (congestive) heart failure: Secondary | ICD-10-CM | POA: Diagnosis present

## 2020-06-20 DIAGNOSIS — K5790 Diverticulosis of intestine, part unspecified, without perforation or abscess without bleeding: Secondary | ICD-10-CM | POA: Diagnosis present

## 2020-06-20 DIAGNOSIS — Z91013 Allergy to seafood: Secondary | ICD-10-CM

## 2020-06-20 DIAGNOSIS — K922 Gastrointestinal hemorrhage, unspecified: Secondary | ICD-10-CM

## 2020-06-20 DIAGNOSIS — K259 Gastric ulcer, unspecified as acute or chronic, without hemorrhage or perforation: Secondary | ICD-10-CM | POA: Diagnosis present

## 2020-06-20 DIAGNOSIS — Z7984 Long term (current) use of oral hypoglycemic drugs: Secondary | ICD-10-CM

## 2020-06-20 DIAGNOSIS — K602 Anal fissure, unspecified: Secondary | ICD-10-CM | POA: Diagnosis present

## 2020-06-20 DIAGNOSIS — E785 Hyperlipidemia, unspecified: Secondary | ICD-10-CM | POA: Diagnosis present

## 2020-06-20 DIAGNOSIS — Z8249 Family history of ischemic heart disease and other diseases of the circulatory system: Secondary | ICD-10-CM

## 2020-06-20 DIAGNOSIS — Z91048 Other nonmedicinal substance allergy status: Secondary | ICD-10-CM

## 2020-06-20 DIAGNOSIS — M25551 Pain in right hip: Secondary | ICD-10-CM | POA: Diagnosis not present

## 2020-06-20 DIAGNOSIS — Z79899 Other long term (current) drug therapy: Secondary | ICD-10-CM

## 2020-06-20 DIAGNOSIS — F419 Anxiety disorder, unspecified: Secondary | ICD-10-CM | POA: Diagnosis present

## 2020-06-20 DIAGNOSIS — D500 Iron deficiency anemia secondary to blood loss (chronic): Secondary | ICD-10-CM | POA: Diagnosis present

## 2020-06-20 DIAGNOSIS — N186 End stage renal disease: Secondary | ICD-10-CM | POA: Diagnosis present

## 2020-06-20 DIAGNOSIS — D62 Acute posthemorrhagic anemia: Secondary | ICD-10-CM | POA: Diagnosis present

## 2020-06-20 DIAGNOSIS — D649 Anemia, unspecified: Secondary | ICD-10-CM | POA: Diagnosis not present

## 2020-06-20 DIAGNOSIS — K648 Other hemorrhoids: Secondary | ICD-10-CM | POA: Diagnosis present

## 2020-06-20 DIAGNOSIS — K625 Hemorrhage of anus and rectum: Secondary | ICD-10-CM | POA: Diagnosis not present

## 2020-06-20 DIAGNOSIS — F17210 Nicotine dependence, cigarettes, uncomplicated: Secondary | ICD-10-CM | POA: Diagnosis present

## 2020-06-20 DIAGNOSIS — Z992 Dependence on renal dialysis: Secondary | ICD-10-CM | POA: Diagnosis present

## 2020-06-20 DIAGNOSIS — K31811 Angiodysplasia of stomach and duodenum with bleeding: Principal | ICD-10-CM | POA: Diagnosis present

## 2020-06-20 DIAGNOSIS — Z91041 Radiographic dye allergy status: Secondary | ICD-10-CM

## 2020-06-20 DIAGNOSIS — R9431 Abnormal electrocardiogram [ECG] [EKG]: Secondary | ICD-10-CM | POA: Diagnosis present

## 2020-06-20 DIAGNOSIS — K5520 Angiodysplasia of colon without hemorrhage: Secondary | ICD-10-CM

## 2020-06-20 DIAGNOSIS — B1920 Unspecified viral hepatitis C without hepatic coma: Secondary | ICD-10-CM | POA: Diagnosis present

## 2020-06-20 DIAGNOSIS — Z888 Allergy status to other drugs, medicaments and biological substances status: Secondary | ICD-10-CM

## 2020-06-20 DIAGNOSIS — Z8049 Family history of malignant neoplasm of other genital organs: Secondary | ICD-10-CM

## 2020-06-20 DIAGNOSIS — K2950 Unspecified chronic gastritis without bleeding: Secondary | ICD-10-CM | POA: Diagnosis present

## 2020-06-20 DIAGNOSIS — B3781 Candidal esophagitis: Secondary | ICD-10-CM

## 2020-06-20 DIAGNOSIS — K5521 Angiodysplasia of colon with hemorrhage: Secondary | ICD-10-CM | POA: Diagnosis present

## 2020-06-20 DIAGNOSIS — I10 Essential (primary) hypertension: Secondary | ICD-10-CM | POA: Diagnosis not present

## 2020-06-20 DIAGNOSIS — Z833 Family history of diabetes mellitus: Secondary | ICD-10-CM

## 2020-06-20 DIAGNOSIS — F32A Depression, unspecified: Secondary | ICD-10-CM | POA: Diagnosis present

## 2020-06-20 DIAGNOSIS — I132 Hypertensive heart and chronic kidney disease with heart failure and with stage 5 chronic kidney disease, or end stage renal disease: Secondary | ICD-10-CM | POA: Diagnosis not present

## 2020-06-20 DIAGNOSIS — U071 COVID-19: Secondary | ICD-10-CM

## 2020-06-20 DIAGNOSIS — K2100 Gastro-esophageal reflux disease with esophagitis, without bleeding: Secondary | ICD-10-CM | POA: Diagnosis present

## 2020-06-20 DIAGNOSIS — E1142 Type 2 diabetes mellitus with diabetic polyneuropathy: Secondary | ICD-10-CM | POA: Diagnosis present

## 2020-06-20 DIAGNOSIS — I25118 Atherosclerotic heart disease of native coronary artery with other forms of angina pectoris: Secondary | ICD-10-CM | POA: Diagnosis present

## 2020-06-20 DIAGNOSIS — Z882 Allergy status to sulfonamides status: Secondary | ICD-10-CM

## 2020-06-20 DIAGNOSIS — E1151 Type 2 diabetes mellitus with diabetic peripheral angiopathy without gangrene: Secondary | ICD-10-CM | POA: Diagnosis present

## 2020-06-20 DIAGNOSIS — Z89612 Acquired absence of left leg above knee: Secondary | ICD-10-CM

## 2020-06-20 DIAGNOSIS — E1122 Type 2 diabetes mellitus with diabetic chronic kidney disease: Secondary | ICD-10-CM | POA: Diagnosis present

## 2020-06-20 LAB — COMPREHENSIVE METABOLIC PANEL
ALT: 16 U/L (ref 0–44)
AST: 16 U/L (ref 15–41)
Albumin: 2.3 g/dL — ABNORMAL LOW (ref 3.5–5.0)
Alkaline Phosphatase: 57 U/L (ref 38–126)
Anion gap: 12 (ref 5–15)
BUN: 26 mg/dL — ABNORMAL HIGH (ref 8–23)
CO2: 28 mmol/L (ref 22–32)
Calcium: 7.6 mg/dL — ABNORMAL LOW (ref 8.9–10.3)
Chloride: 95 mmol/L — ABNORMAL LOW (ref 98–111)
Creatinine, Ser: 3.92 mg/dL — ABNORMAL HIGH (ref 0.44–1.00)
GFR, Estimated: 12 mL/min — ABNORMAL LOW (ref >60)
Glucose, Bld: 112 mg/dL — ABNORMAL HIGH (ref 70–99)
Potassium: 3.6 mmol/L (ref 3.5–5.1)
Sodium: 135 mmol/L (ref 135–145)
Total Bilirubin: 0.8 mg/dL (ref 0.3–1.2)
Total Protein: 4.8 g/dL — ABNORMAL LOW (ref 6.5–8.1)

## 2020-06-20 LAB — CBC
HCT: 17.7 % — ABNORMAL LOW (ref 36.0–46.0)
Hemoglobin: 5.5 g/dL — CL (ref 12.0–15.0)
MCH: 29.9 pg (ref 26.0–34.0)
MCHC: 31.1 g/dL (ref 30.0–36.0)
MCV: 96.2 fL (ref 80.0–100.0)
Platelets: 191 10*3/uL (ref 150–400)
RBC: 1.84 MIL/uL — ABNORMAL LOW (ref 3.87–5.11)
RDW: 17.7 % — ABNORMAL HIGH (ref 11.5–15.5)
WBC: 4 10*3/uL (ref 4.0–10.5)
nRBC: 0 % (ref 0.0–0.2)

## 2020-06-20 LAB — RETICULOCYTES
Immature Retic Fract: 28.1 % — ABNORMAL HIGH (ref 2.3–15.9)
RBC.: 2.53 MIL/uL — ABNORMAL LOW (ref 3.87–5.11)
Retic Count, Absolute: 88.8 10*3/uL (ref 19.0–186.0)
Retic Ct Pct: 3.5 % — ABNORMAL HIGH (ref 0.4–3.1)

## 2020-06-20 LAB — IRON AND TIBC
Iron: 27 ug/dL — ABNORMAL LOW (ref 28–170)
Saturation Ratios: 14 % (ref 10.4–31.8)
TIBC: 193 ug/dL — ABNORMAL LOW (ref 250–450)
UIBC: 166 ug/dL

## 2020-06-20 LAB — PREPARE RBC (CROSSMATCH)

## 2020-06-20 LAB — VITAMIN B12: Vitamin B-12: 1703 pg/mL — ABNORMAL HIGH (ref 180–914)

## 2020-06-20 LAB — FOLATE: Folate: 62.6 ng/mL (ref >5.9)

## 2020-06-20 LAB — SARS CORONAVIRUS 2 (TAT 6-24 HRS): SARS Coronavirus 2: POSITIVE — AB

## 2020-06-20 LAB — FERRITIN: Ferritin: 896 ng/mL — ABNORMAL HIGH (ref 11–307)

## 2020-06-20 LAB — POC OCCULT BLOOD, ED: Fecal Occult Bld: POSITIVE — AB

## 2020-06-20 LAB — CBG MONITORING, ED: Glucose-Capillary: 89 mg/dL (ref 70–99)

## 2020-06-20 MED ORDER — HYDRALAZINE HCL 50 MG PO TABS
100.0000 mg | ORAL_TABLET | ORAL | Status: DC
  Administered 2020-06-24 (×2): 100 mg via ORAL
  Filled 2020-06-20 (×3): qty 2

## 2020-06-20 MED ORDER — PENTAFLUOROPROP-TETRAFLUOROETH EX AERO: 1.0000 "application " | INHALATION_SPRAY | CUTANEOUS | Status: DC | PRN

## 2020-06-20 MED ORDER — ALTEPLASE 2 MG IJ SOLR
2.0000 mg | Freq: Once | INTRAMUSCULAR | Status: DC | PRN
  Filled 2020-06-20: qty 2

## 2020-06-20 MED ORDER — MISOPROSTOL 100 MCG PO TABS
100.0000 ug | ORAL_TABLET | Freq: Four times a day (QID) | ORAL | Status: DC
  Administered 2020-06-21 – 2020-06-25 (×18): 100 ug via ORAL
  Filled 2020-06-20 (×23): qty 1

## 2020-06-20 MED ORDER — CARVEDILOL 3.125 MG PO TABS
3.1250 mg | ORAL_TABLET | Freq: Two times a day (BID) | ORAL | Status: DC
  Administered 2020-06-22 – 2020-06-25 (×5): 3.125 mg via ORAL
  Filled 2020-06-20 (×5): qty 1

## 2020-06-20 MED ORDER — GABAPENTIN 300 MG PO CAPS
300.0000 mg | ORAL_CAPSULE | ORAL | Status: DC
  Administered 2020-06-21 – 2020-06-23 (×4): 300 mg via ORAL
  Filled 2020-06-20 (×4): qty 1

## 2020-06-20 MED ORDER — ACETAMINOPHEN 325 MG PO TABS
650.0000 mg | ORAL_TABLET | Freq: Once | ORAL | Status: DC
  Filled 2020-06-20: qty 2

## 2020-06-20 MED ORDER — CLONIDINE HCL 0.1 MG PO TABS
0.1000 mg | ORAL_TABLET | ORAL | Status: DC
  Administered 2020-06-22 – 2020-06-24 (×3): 0.1 mg via ORAL
  Filled 2020-06-20 (×4): qty 1

## 2020-06-20 MED ORDER — HEPARIN SODIUM (PORCINE) 1000 UNIT/ML DIALYSIS
1000.0000 [IU] | INTRAMUSCULAR | Status: DC | PRN
  Filled 2020-06-20: qty 1

## 2020-06-20 MED ORDER — SODIUM CHLORIDE 0.9% IV SOLUTION: Freq: Once | INTRAVENOUS | Status: AC

## 2020-06-20 MED ORDER — SODIUM CHLORIDE 0.9 % IV SOLN: 100.0000 mL | INTRAVENOUS | Status: DC | PRN

## 2020-06-20 MED ORDER — ACETAMINOPHEN 500 MG PO TABS
500.0000 mg | ORAL_TABLET | Freq: Four times a day (QID) | ORAL | Status: DC | PRN
  Administered 2020-06-21 (×2): 1000 mg via ORAL
  Filled 2020-06-20 (×2): qty 2

## 2020-06-20 MED ORDER — INSULIN ASPART 100 UNIT/ML ~~LOC~~ SOLN
0.0000 [IU] | Freq: Three times a day (TID) | SUBCUTANEOUS | Status: DC
  Administered 2020-06-21: 1 [IU] via SUBCUTANEOUS
  Administered 2020-06-22: 2 [IU] via SUBCUTANEOUS
  Administered 2020-06-22 – 2020-06-23 (×3): 1 [IU] via SUBCUTANEOUS
  Administered 2020-06-24: 2 [IU] via SUBCUTANEOUS

## 2020-06-20 MED ORDER — ISOSORBIDE MONONITRATE ER 30 MG PO TB24
30.0000 mg | ORAL_TABLET | Freq: Every day | ORAL | Status: DC
  Administered 2020-06-21 – 2020-06-24 (×4): 30 mg via ORAL
  Filled 2020-06-20 (×4): qty 1

## 2020-06-20 MED ORDER — PANTOPRAZOLE SODIUM 40 MG PO TBEC
40.0000 mg | DELAYED_RELEASE_TABLET | Freq: Two times a day (BID) | ORAL | Status: DC
  Administered 2020-06-21 – 2020-06-24 (×8): 40 mg via ORAL
  Filled 2020-06-20 (×9): qty 1

## 2020-06-20 MED ORDER — HEPARIN SODIUM (PORCINE) 5000 UNIT/ML IJ SOLN
5000.0000 [IU] | Freq: Three times a day (TID) | INTRAMUSCULAR | Status: DC
  Administered 2020-06-23 – 2020-06-25 (×5): 5000 [IU] via SUBCUTANEOUS
  Filled 2020-06-20 (×8): qty 1

## 2020-06-20 MED ORDER — METHOCARBAMOL 500 MG PO TABS: 500.0000 mg | ORAL_TABLET | Freq: Three times a day (TID) | ORAL | Status: DC | PRN

## 2020-06-20 MED ORDER — CALCIUM ACETATE (PHOS BINDER) 667 MG PO CAPS
1334.0000 mg | ORAL_CAPSULE | ORAL | Status: DC | PRN
  Filled 2020-06-20: qty 2

## 2020-06-20 MED ORDER — CALCIUM ACETATE (PHOS BINDER) 667 MG PO CAPS: 1334.0000 mg | ORAL_CAPSULE | ORAL | Status: DC

## 2020-06-20 MED ORDER — GABAPENTIN 100 MG PO CAPS
200.0000 mg | ORAL_CAPSULE | ORAL | Status: DC
  Administered 2020-06-22 – 2020-06-25 (×7): 200 mg via ORAL
  Filled 2020-06-20 (×7): qty 2

## 2020-06-20 MED ORDER — CALCIUM ACETATE (PHOS BINDER) 667 MG PO CAPS
1334.0000 mg | ORAL_CAPSULE | Freq: Every day | ORAL | Status: DC
  Administered 2020-06-21 – 2020-06-24 (×4): 1334 mg via ORAL
  Filled 2020-06-20 (×5): qty 2

## 2020-06-20 MED ORDER — INSULIN ASPART 100 UNIT/ML ~~LOC~~ SOLN: 0.0000 [IU] | Freq: Every day | SUBCUTANEOUS | Status: DC

## 2020-06-20 MED ORDER — ATORVASTATIN CALCIUM 10 MG PO TABS
10.0000 mg | ORAL_TABLET | Freq: Every day | ORAL | Status: DC
  Administered 2020-06-20 – 2020-06-24 (×5): 10 mg via ORAL
  Filled 2020-06-20 (×5): qty 1

## 2020-06-20 MED ORDER — CALCIUM ACETATE (PHOS BINDER) 667 MG PO CAPS
2001.0000 mg | ORAL_CAPSULE | Freq: Two times a day (BID) | ORAL | Status: DC
  Administered 2020-06-21 – 2020-06-25 (×9): 2001 mg via ORAL
  Filled 2020-06-20 (×11): qty 3

## 2020-06-20 MED ORDER — LIDOCAINE HCL (PF) 1 % IJ SOLN
5.0000 mL | INTRAMUSCULAR | Status: DC | PRN
  Filled 2020-06-20: qty 5

## 2020-06-20 MED ORDER — LIDOCAINE-PRILOCAINE 2.5-2.5 % EX CREA
1.0000 "application " | TOPICAL_CREAM | CUTANEOUS | Status: DC | PRN
  Filled 2020-06-20: qty 5

## 2020-06-20 NOTE — Consult Note (Signed)
Brandy Houston: 3:58 PM 06/20/2020  LOS: 0 days    Referring Provider: ED PA [place]  Primary Care Physician:  [name], MD Primary Gastroenterologist:  Dr. Silverio Decamp    Reason for Consultation:  Rectal bleeding.  anemia   HPI: Brandy Houston is a 65 y.o. female.  PMH ESRD on TTS dialysis.  Chronic anemia.  CHF, LVEF 30 - 35%.  NIDDM.  Hepatitis C, eradicated. Unstable angina, 3 V CAD/severe tricuspid dz per cath/echo of 02/2020 to be managed medically due to significant co-morbities and high risk if PCI or CABG.  S/p L AKA.    Several scopes for FOBT + anemia.  Chronic dark stools due to oral iron.   01/2020 colonoscopy.  Scattered pandiverticulosis, nonbleeding internal hemorrhoids. 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  5 scattered gastric erosions.  Gastric biopsies with mild chronic gastritis, benign duodenal biopsies. 04/2020 EGD for anemia, FOBT +.  Grade B esophagitis, nonbleeding antral AVM treated with APC. 05/28/2020 enteroscopy for recurrent anemia, FOBT +.  .  Grade C, nonbleeding esophagitis.  Candida esophagitis.  Bleeding AVMs in the gastric antrum.  Additional nonbleeding AVMs in the stomach, duodenum, jejunum.  All AVMs were treated with APC.  Plan was to treat with Protonix 40 mg p.o. bid for 2 months followed by chronic daily use.  Misoprostol 100 mg p.o. qid for 4 weeks.  Diflucan 100 mg po x 7 days.   Transfused 2 PRBCs and Hgb 6.1 >> 8.7 during 4 d admit of 12/11.   Received Feraheme in 02/2020 for Hgb 7.7.  1 PRBC in 01/2020, Hgb 6 >> 8.3. Additionally received Feraheme  Back to ED today due to passing blood w BM's starting ~ 12/29.  Bleeding only w BM's.  No constipation or straining.  No rectal pain.  Stool itself is soft, dark colored per norm.   Phoned GI and on 1/4 and Dr Delane Ginger phoned in RX for Anusol [facility] for 1 week, tid Benfiber added.  However the GI office was unable to reach the patient so she has not picked up the prescriptions.  She presented to ED today for evaluation.  Feeling somewhat weak, dizzy, last night had some shortness of breath and left-sided chest pressure which resolved after sublingual nitroglycerin and has not returned. When asked to quantify the amount of blood she is passing about 3 times a day, she says it would spillover her palm but if you put her 2 palms together they could contain the amount of blood.  On the prior admissions when she had FOBT positive stools and anemia, her stools did not look bloody or melenic in fact they looked normal to her. Some recent anorexia, no nausea or vomiting.  No new meds.  No nosebleeds. Hgb is 5.5, was 9 on 05/29/20.   BPs low 100s/50s.  HR in 70s.  RA sats high 90s.  Initial temp 100.5 F, subsequently a febrile.         Past Medical History:  Diagnosis Date  .  Allergy   . Anemia   . Anxiety   . Arthritis    "in my joints" (03/10/2017)  . Chronic diastolic CHF (congestive heart failure) ([place])   . CKD (chronic kidney disease), stage IV ([place])    stage IV. previous HD, none currently 10/30/15 (confirmed 02/05/2017 & 03/10/2017)  . Depression    Chronic  . Diabetic peripheral neuropathy ([place])   . Dysrhythmia    tachycardia, normal ECHO 08-09-14  . GERD (gastroesophageal reflux disease)   . Heart murmur     dx'd 02/05/2017  . Hepatitis C    "tx'd in 2016; I'm negative now" (02/05/2017)  . History of blood transfusion 2017   "w/knee replacement"  . Hyperlipidemia   . Hypertension   . Osteoarthritis of left knee   . Peripheral neuropathy   . Protein calorie malnutrition ([place])   . Septic arthritis ([place])   . Slow transit constipation   . Symptomatic anemia 01/27/2020  . Type II diabetes mellitus ([name])    IDDM  . Uncontrolled hypertension 02/18/2013  . Unsteady gait      Past Surgical History:  Procedure Laterality Date  . A/V FISTULAGRAM Right 07/07/2018   Procedure: A/V FISTULAGRAM;  Surgeon: Marty Heck, MD;  Location: [place] [facility];  Service: Cardiovascular;  Laterality: Right;  . AMPUTATION Left 03/20/2017   Procedure: LEFT ABOVE KNEE AMPUTATION;  Surgeon: Newt Minion, MD;  Location: [place];  Service: Orthopedics;  Laterality: Left;  . APPENDECTOMY    . AV FISTULA PLACEMENT Left 09/13/2014   Procedure: Brachial Artery to Brachial Vein Gortex Four - Seven Stretch GRAFT INSERTION Left Forearm;  Surgeon: Mal Misty, MD;  Location: [place];  Service: Vascular;  Laterality: Left;  . BASCILIC VEIN TRANSPOSITION Right 10/26/2017   Procedure: BASILIC VEIN TRANSPOSITION FIRST STAGE RIGHT ARM;  Surgeon: Conrad Goldville, MD;  Location: [place];  Service: Vascular;  Laterality: Right;  . BASCILIC VEIN TRANSPOSITION Right 02/01/2018   Procedure: SECOND STAGE BASILIC VEIN TRANSPOSITION RIGHT UPPER EXTREMITY;  Surgeon: Marty Heck, MD;  Location: [place];  Service: Vascular;  Laterality: Right;  . CHOLECYSTECTOMY OPEN    . COLON SURGERY    . COLONOSCOPY    . ENTEROSCOPY N/A 05/28/2020   Procedure: ENTEROSCOPY;  Surgeon: Mauri Pole, MD;  Location: [facility] ENDOSCOPY;  Service: Endoscopy;  Laterality: N/A;  . ESOPHAGOGASTRODUODENOSCOPY (EGD) WITH PROPOFOL N/A 05/14/2020   Procedure: ESOPHAGOGASTRODUODENOSCOPY (EGD) WITH PROPOFOL;  Surgeon: Mauri Pole, MD;  Location: [facility];  Service: Endoscopy;  Laterality: N/A;  with APC  . EXCISIONAL TOTAL KNEE ARTHROPLASTY WITH ANTIBIOTIC SPACERS Left 02/06/2017   Procedure: Incisional total iknee with antibiotic spacer ;  Surgeon: Leandrew Koyanagi, MD;  Location: [place];  Service: Orthopedics;  Laterality: Left;  . HOT HEMOSTASIS N/A 05/14/2020   Procedure: HOT HEMOSTASIS (ARGON PLASMA COAGULATION/BICAP);  Surgeon: Mauri Pole, MD;  Location: [facility];  Service: Endoscopy;  Laterality:  N/A;  . HOT HEMOSTASIS N/A 05/28/2020   Procedure: HOT HEMOSTASIS (ARGON PLASMA COAGULATION/BICAP);  Surgeon: Mauri Pole, MD;  Location[facility];  Service: Endoscopy;  Laterality: N/A;  . INTRAVASCULAR PRESSURE WIRE/FFR STUDY N/A 04/13/2020   Procedure: INTRAVASCULAR PRESSURE WIRE/FFR STUDY;  Surgeon: Burnell Blanks, MD;  Location: [place] [facility];  Service: Cardiovascular;  Laterality: N/A;  . IR FLUORO GUIDE CV LINE RIGHT  02/10/2017  . IR REMOVAL TUN CV CATH W/O FL  04/01/2017  . IR US GUIDE VASC ACCESS RIGHT  02/10/2017  . IRRIGATION AND DEBRIDEMENT KNEE Left 03/12/2017   Procedure: IRRIGATION AND DEBRIDEMENT LEFT KNEE WITH WOUND VAC APPLICATION;  Surgeon: Leandrew Koyanagi, MD;  Location: [place];  Service: Orthopedics;  Laterality: Left;  . JOINT REPLACEMENT    . KNEE ARTHROSCOPY Right 08/10/2014   Procedure: ARTHROSCOPY I & D KNEE;  Surgeon: Marianna Payment, MD;  Location: [facility];  Service: Orthopedics;  Laterality: Right;  . KNEE ARTHROSCOPY Left 08/11/2014   Procedure: ARTHROSCOPIC WASHOUT LEFT KNEE;  Surgeon: Marianna Payment, MD;  Location: [place];  Service: Orthopedics;  Laterality: Left;  . KNEE ARTHROSCOPY Left 08/19/2014   Procedure: ARTHROSCOPIC WASHOUT LEFT KNEE;  Surgeon: Leandrew Koyanagi, MD;  Location: [place];  Service: Orthopedics;  Laterality: Left;  . KNEE ARTHROSCOPY WITH LATERAL MENISECTOMY Left 04/04/2015   Procedure: AND PARTIAL LATERAL MENISECTOMY;  Surgeon: Leandrew Koyanagi, MD;  Location: [place];  Service: Orthopedics;  Laterality: Left;  . KNEE ARTHROSCOPY WITH MEDIAL MENISECTOMY Left 04/04/2015   Procedure: LEFT KNEE ARTHROSCOPY WITH PARTIAL MEDIAL MENISCECTOMY  AND SYNOVECTOMY;  Surgeon: Leandrew Koyanagi, MD;  Location: [place];  Service: Orthopedics;  Laterality: Left;  . PERIPHERAL VASCULAR BALLOON ANGIOPLASTY  07/07/2018   Procedure: PERIPHERAL VASCULAR BALLOON ANGIOPLASTY;  Surgeon: Marty Heck, MD;  Location:  [place] CV [facility];  Service: Cardiovascular;;  right AV fistula  . POLYPECTOMY    . RIGHT/LEFT HEART CATH AND CORONARY ANGIOGRAPHY N/A 04/13/2020   Procedure: RIGHT/LEFT HEART CATH AND CORONARY ANGIOGRAPHY;  Surgeon: Burnell Blanks, MD;  Location: [place] [facility];  Service: Cardiovascular;  Laterality: N/A;  . SHOULDER ARTHROSCOPY Bilateral 08/10/2014   Procedure: I & D BILATERAL SHOULDERS ;  Surgeon: Marianna Payment, MD;  Location: [facility];  Service: Orthopedics;  Laterality: Bilateral;  . SMALL INTESTINE SURGERY     Due to Small Bowel Obstruction; "fixed it when they did my gallbladder OR"  . TEE WITHOUT CARDIOVERSION N/A 08/14/2014   Procedure: TRANSESOPHAGEAL ECHOCARDIOGRAM (TEE);  Surgeon: Thayer Headings, MD;  Location: [place];  Service: Cardiovascular;  Laterality: N/A;  . TENOSYNOVECTOMY Right 08/11/2014   Procedure: RIGHT WRIST IRRIGATION AND DEBRIDEMENT, TENOSYNOVECTOMY;  Surgeon: Marianna Payment, MD;  Location: [place];  Service: Orthopedics;  Laterality: Right;  . TOTAL KNEE ARTHROPLASTY Left 11/07/2015   Procedure: LEFT TOTAL KNEE ARTHROPLASTY WITH REVISION OF IMPLANTS;  Surgeon: Leandrew Koyanagi, MD;  Location: [place];  Service: Orthopedics;  Laterality: Left;  . TUBAL LIGATION      Prior to Admission medications   Medication Sig Start Date End Date Taking? Authorizing Provider  Accu-Chek Softclix Lancets lancets Check sugars three times a day for E11.65 09/20/18   [name], MD  acetaminophen (TYLENOL) 500 MG tablet Take 500-1,000 mg by mouth every 6 (six) hours as needed (for pain.).    [provider]  atorvastatin (LIPITOR) 10 MG tablet Take 1 tablet (10 mg total) by mouth at bedtime. 06/13/20   [name], MD  blood glucose meter kit and supplies KIT Dispense based on patient and insurance preference. Use up to four times daily as directed 10/13/19   [name], MD  Blood Glucose Monitoring Suppl (ACCU-CHEK GUIDE) w/Device KIT 1 Device by  Does not apply route daily. 10/31/19   [name], MD  calcium acetate (PHOSLO) 667 MG capsule Take 1,334-2,001 mg by mouth See admin instructions. Take 3 capsules (2001 mg) by mouth with breakfast, take  2 capsules (1334 mg) by mouth with lunch, take 3 capsules (2001 mg) by mouth with supper & take 2 capsules (1334 mg) by mouth with snacks. 01/13/19   [provider]  carvedilol (COREG) 3.125 MG tablet Take 1 tablet (3.125 mg total) by mouth 2 (two) times daily. 04/04/20 07/03/20  Donato Heinz, MD  cloNIDine (CATAPRES) 0.1 MG tablet Take 0.1 mg by mouth See admin instructions. Bid on non-dialysis days( m,w,f,sun) 03/29/20   [provider]  diphenhydrAMINE (BENADRYL) 25 mg capsule Take 25 mg by mouth every 6 (six) hours as needed for allergies.    [provider]  gabapentin (NEURONTIN) 100 MG capsule Take 200 mg by mouth See admin instructions. Take 2 capsules (200 mg) by mouth 3 times on Sundays, Mondays, Wednesdays, & Fridays. 11/01/18   [provider]  gabapentin (NEURONTIN) 300 MG capsule Take 300 mg by mouth See admin instructions. Take 1 capsule (300 mg) by mouth twice daily on Tuesdays, Thursdays, & Saturdays.    [provider]  glucose blood (ACCU-CHEK GUIDE) test strip Check blood sugar up to 4 times a day 10/20/19   [name], MD  hydrALAZINE (APRESOLINE) 100 MG tablet Take 100 mg by mouth See admin instructions. Bid on non-dialysis days.( m,w,f,sun)    [provider]  isosorbide mononitrate (IMDUR) 30 MG 24 hr tablet Take 1 tablet (30 mg total) by mouth daily. 04/16/20 07/15/20  Cheryln Manly, NP  Lancet Devices [facility]) lancets Check sugars TID for E11.65 01/29/15   Lance Bosch, NP  lidocaine-prilocaine (EMLA) cream Apply 1 application topically Every Tuesday,Thursday,and Saturday with dialysis. On days of dialysis 3x week 10/25/18   [provider]  methocarbamol (ROBAXIN) 500 MG tablet Take  500 mg by mouth every 8 (eight) hours as needed for muscle spasms.  06/08/19   [provider]  Methoxy PEG-Epoetin Beta (MIRCERA IJ) Get it at dialysis 06/30/19 06/28/20  [provider]  misoprostol (CYTOTEC) 100 MCG tablet Take 1 tablet (100 mcg total) by mouth every 6 (six) hours. 05/29/20 06/28/20  Pahwani, Michell Heinrich, MD  multivitamin (RENA-VIT) TABS tablet Take 1 tablet by mouth daily. 03/01/19   [provider]  nitroGLYCERIN (NITROSTAT) 0.4 MG SL tablet Place 1 tablet (0.4 mg total) under the tongue every 5 (five) minutes as needed. 04/25/20 07/24/20  Donato Heinz, MD  pantoprazole (PROTONIX) 40 MG tablet Take 1 tablet (40 mg total) by mouth 2 (two) times daily. 40 mg BID for 2 months & then once daily 05/29/20 07/28/20  Pahwani, Michell Heinrich, MD  polyethylene glycol (MIRALAX / GLYCOLAX) 17 g packet Take 17 g by mouth daily as needed for moderate constipation. 12/10/18   [name], MD  sertraline (ZOLOFT) 100 MG tablet Take 2 tablets (200 mg total) by mouth daily. 06/13/20   [name], MD  traZODone (DESYREL) 50 MG tablet Take 50 mg by mouth at bedtime as needed for sleep.  10/27/18   [provider]    Scheduled Meds: . acetaminophen  650 mg Oral Once   Infusions:  PRN Meds:    Allergies as of 06/20/2020 - Review Complete 06/20/2020  Allergen Reaction Noted  . Compazine [prochlorperazine] Shortness Of Breath, Swelling, and Other (See Comments) 11/30/2012  . Shellfish-derived products Anaphylaxis 11/04/2013  . Iodinated diagnostic agents Hives and Rash 11/04/2013  . Omnipaque [iohexol] Hives 10/18/2011  . Tape  07/07/2018  . Sulfa antibiotics Rash 06/20/2013    Family History  Problem Relation Age of Onset  . Uterine cancer Mother   . Heart disease Father   . Hypertension Sister   . Uterine cancer Sister   . Cancer Brother        unsure type  . Diabetes Brother   . Hypertension Brother   . Colon cancer Neg Hx   . Colon polyps  Neg Hx   . Esophageal cancer Neg Hx   . Rectal cancer Neg Hx   . Stomach cancer Neg Hx     Social History   Socioeconomic History  . Marital status: Single    Spouse name: Not on file  . Number of children: 3  . Years of education: Not on file  . Highest education level: Not on file  Occupational History  . Occupation: disabled  Tobacco Use  . Smoking status: Current Every Day Smoker    Packs/day: 0.50    Years: 35.00    Pack years: 17.50    Types: Cigarettes  . Smokeless tobacco: Never Used  Vaping Use  . Vaping Use: Never used  Substance and Sexual Activity  . Alcohol use: Not Currently    Alcohol/week: 0.0 standard drinks    Comment: 03/10/2017 "I drink a wine cooler a few times/year"  . Drug use: No  . Sexual activity: Not Currently  Other Topics Concern  . Not on file  Social History Narrative  . Not on file   Social Determinants of Health   Financial Resource Strain: Not on file  Food Insecurity: Not on file  Transportation Needs: Not on file  Physical Activity: Not on file  Stress: Not on file  Social Connections: Not on file  Intimate Partner Violence: Not on file    REVIEW OF SYSTEMS: See HPI for pertinent system reviews.  Otherwise review of systems negative   PHYSICAL EXAM: Vital signs in last 24 hours: Vitals:   06/20/20 1530 06/20/20 1555  BP: (!) 106/55 (!) 103/54  Pulse: 74 73  Resp: 15 15  Temp: 98.1 F (36.7 C) 98.8 F (37.1 C)  SpO2: 100% 98%   Wt Readings from Last 3 Encounters:  06/13/20 61.3 kg  05/26/20 59 kg  05/25/20 61.2 kg    General: Patient was sleeping when I arrived, she was easy to wake up and provided good history.  Looks somewhat chronically ill but comfortable. Head: No facial asymmetry or swelling.  No signs of head trauma. Eyes: No conjunctival pallor or scleral icterus. Ears: Not hard of hearing Nose: No congestion or discharge Mouth: Edentulous.  Mucosa is pink, moist, clear.  Tongue is midline. Neck: No  JVD, masses, thyromegaly. Lungs: Clear bilaterally without labored breathing or cough. Heart: RRR.  No MRG.  S1, S2 present Abdomen: Soft, active bowel sounds.  Not tender or distended.  No HSM, masses, bruits, hernias.   Rectal: The external rectum is bloody.  On digital exam there is no tenderness, no visible or palpable palpable hemorrhoids but the exam glove has blood on it separate from the soft, dark brown stool  Musc/Skeltl: No joint redness or swelling Extremities: Status post left AKA,.  AV graft/fistula in upper right arm. Neurologic: Alert.  Oriented x3.  Appropriate.  No tremors.  Moves all 4 limbs, strength not tested. Skin: No obvious abrasions or open sores. Tattoos: None observed Nodes: No cervical adenopathy Psych: Cooperative, calm, pleasant.  Intake/Output from previous day: No intake/output data recorded. Intake/Output this shift: No intake/output data recorded.  LAB RESULTS: Recent Labs  06/20/20 1225  WBC 4.0  HGB 5.5*  HCT 17.7*  PLT 191   BMET Lab Results  Component Value Date   NA 135 06/20/2020   NA 138 05/29/2020   NA 141 05/28/2020   K 3.6 06/20/2020   K 3.4 (L) 05/29/2020   K 3.7 05/28/2020   CL 95 (L) 06/20/2020   CL 101 05/29/2020   CL 100 05/28/2020   CO2 28 06/20/2020   CO2 28 05/29/2020   CO2 28 05/28/2020   GLUCOSE 112 (H) 06/20/2020   GLUCOSE 110 (H) 05/29/2020   GLUCOSE 121 (H) 05/28/2020   BUN 26 (H) 06/20/2020   BUN 43 (H) 05/29/2020   BUN 39 (H) 05/28/2020   CREATININE 3.92 (H) 06/20/2020   CREATININE 5.48 (H) 05/29/2020   CREATININE 4.69 (H) 05/28/2020   CALCIUM 7.6 (L) 06/20/2020   CALCIUM 8.9 05/29/2020   CALCIUM 9.2 05/28/2020   LFT Recent Labs    06/20/20 1225  PROT 4.8*  ALBUMIN 2.3*  AST 16  ALT 16  ALKPHOS 57  BILITOT 0.8   PT/INR Lab Results  Component Value Date   INR 1.0 12/29/2016   INR 1.29 11/08/2015   INR 1.51 (H) 11/07/2015   Hepatitis Panel No results for input(s): HEPBSAG, HCVAB,  HEPAIGM, HEPBIGM in the last 72 hours. C-Diff No components found for: CDIFF Lipase     Component Value Date/Time   LIPASE 48.0 12/10/2018 1133    Drugs of Abuse     Component Value Date/Time   LABOPIA NONE DETECTED 02/05/2017 1804   COCAINSCRNUR NONE DETECTED 02/05/2017 1804   LABBENZ NONE DETECTED 02/05/2017 1804   AMPHETMU NONE DETECTED 02/05/2017 1804   THCU NONE DETECTED 02/05/2017 1804   LABBARB NONE DETECTED 02/05/2017 1804     RADIOLOGY STUDIES: No results found.    IMPRESSION:   *   Painless hematochezia.  Presentation different from previous admissions for management of GI bleeds from upper GI AVMs. Hx recurrent upper GI bleeds from AVMs.  Cauterized on several occasions last year, most recent enteroscopy/cauterization 05/28/2020 Pandiverticulosis and hemorrhoids on 01/2020 colonoscopy. Since the hematochezia is occurring almost exclusively when she has a bowel movement, suspect this is hemorrhoidal though that is a big drop of hgb/blood loss from hemorrhoids alone and she certainly could be having bleeding from AVMs in addition to this. She underwent colonoscopy scopic eval in August 2021 and has had EGD/enteroscopy x3 in the last 5 months.  *    Anemia, recurrent.  Due to anemia of end-stage renal disease and GI blood loss.  Has required periodic blood transfusions last year.  *    ESRD.  Hemodialysis on TTS.  *    CAD, severe three-vessel disease and severe tricuspid disease, CHF.  LVEF 35%.  Due to comorbidities, not a candidate for surgery or PCI so managed medically.  *   Isolated fever of 100.5 F, subsequently afebrile.   PLAN:     *   Transfuse PRBCs and recheck CBC in AM  *    No need for additional PPI, keep her on Protonix 40 po bid  *   Renal/carb modified diet.   Azucena Freed  06/20/2020, 3:58 PM Phone [phone]

## 2020-06-20 NOTE — H&P (Signed)
History and Physical    Brandy Houston XNT:[id number] DOB: 1956/05/13 DOA: 06/20/2020  PCP: [name], MD  Patient coming from: Home  I have personally briefly reviewed patient's old medical records in [place]  Chief Complaint: Bright red blood per rectum  HPI: Brandy Houston is a 65 y.o. female with medical history significant of ESRD on hemodialysis TTS, diabetes mellitus, left AKA, hepatitis C, congestive heart failure, esophagitis and antral gastric AVM which was recently treated with APC presented to emergency department with a complaint of bright red blood per rectum since about 4 days.  Per patient, she has seen mostly blood clots with each bowel movement.  She denies having any abdominal pain, nausea, vomiting, fever, chills, any problem with urination, chest pain, shortness of breath or any other complaint.  Reportedly, she underwent EGD on 05/28/2020 by Dr. Silverio Decamp.  ED Course: Upon arrival to ED, she had very low-grade fever of 100.5 with borderline low blood pressure which is her baseline.  She was otherwise stable.  Her hemoglobin was 5.5 however it was 9.0 on 05/29/2020.  2 units of PRBC transfusion have been ordered.She was tested positive for occult blood.  GI has been consulted and hospital service were consulted to admit the patient.  Her Covid test is still pending.  Review of Systems: As per HPI otherwise negative.    Past Medical History:  Diagnosis Date  . Allergy   . Anemia   . Anxiety   . Arthritis    "in my joints" (03/10/2017)  . Chronic diastolic CHF (congestive heart failure) ([place])   . CKD (chronic kidney disease), stage IV ([place])    stage IV. previous HD, none currently 10/30/15 (confirmed 02/05/2017 & 03/10/2017)  . Depression    Chronic  . Diabetic peripheral neuropathy ([place])   . Dysrhythmia    tachycardia, normal ECHO 08-09-14  . GERD (gastroesophageal reflux disease)   . Heart murmur     dx'd 02/05/2017  . Hepatitis C    "tx'd in 2016; I'm  negative now" (02/05/2017)  . History of blood transfusion 2017   "w/knee replacement"  . Hyperlipidemia   . Hypertension   . Osteoarthritis of left knee   . Peripheral neuropathy   . Protein calorie malnutrition ([place])   . Septic arthritis ([place])   . Slow transit constipation   . Symptomatic anemia 01/27/2020  . Type II diabetes mellitus ([name])    IDDM  . Uncontrolled hypertension 02/18/2013  . Unsteady gait     Past Surgical History:  Procedure Laterality Date  . A/V FISTULAGRAM Right 07/07/2018   Procedure: A/V FISTULAGRAM;  Surgeon: Marty Heck, MD;  Location: [place] [facility];  Service: Cardiovascular;  Laterality: Right;  . AMPUTATION Left 03/20/2017   Procedure: LEFT ABOVE KNEE AMPUTATION;  Surgeon: Newt Minion, MD;  Location: [place];  Service: Orthopedics;  Laterality: Left;  . APPENDECTOMY    . AV FISTULA PLACEMENT Left 09/13/2014   Procedure: Brachial Artery to Brachial Vein Gortex Four - Seven Stretch GRAFT INSERTION Left Forearm;  Surgeon: Mal Misty, MD;  Location: [place];  Service: Vascular;  Laterality: Left;  . BASCILIC VEIN TRANSPOSITION Right 10/26/2017   Procedure: BASILIC VEIN TRANSPOSITION FIRST STAGE RIGHT ARM;  Surgeon: Conrad [place], MD;  Location: [place];  Service: Vascular;  Laterality: Right;  . BASCILIC VEIN TRANSPOSITION Right 02/01/2018   Procedure: SECOND STAGE BASILIC VEIN TRANSPOSITION RIGHT UPPER EXTREMITY;  Surgeon: Marty Heck, MD;  [facility];  Service: Vascular;  Laterality: Right;  . CHOLECYSTECTOMY OPEN    . COLON SURGERY    . COLONOSCOPY    . ENTEROSCOPY N/A 05/28/2020   Procedure: ENTEROSCOPY;  Surgeon: Mauri Pole, MD;  Location: [facility];  Service: Endoscopy;  Laterality: N/A;  . ESOPHAGOGASTRODUODENOSCOPY (EGD) WITH PROPOFOL N/A 05/14/2020   Procedure: ESOPHAGOGASTRODUODENOSCOPY (EGD) WITH PROPOFOL;  Surgeon: Mauri Pole, MD;  Location: [facility];  Service: Endoscopy;  Laterality: N/A;  with APC   . EXCISIONAL TOTAL KNEE ARTHROPLASTY WITH ANTIBIOTIC SPACERS Left 02/06/2017   Procedure: Incisional total iknee with antibiotic spacer ;  Surgeon: Leandrew Koyanagi, MD;  Location: [place];  Service: Orthopedics;  Laterality: Left;  . HOT HEMOSTASIS N/A 05/14/2020   Procedure: HOT HEMOSTASIS (ARGON PLASMA COAGULATION/BICAP);  Surgeon: Mauri Pole, MD;  Location: [facility];  Service: Endoscopy;  Laterality: N/A;  . HOT HEMOSTASIS N/A 05/28/2020   Procedure: HOT HEMOSTASIS (ARGON PLASMA COAGULATION/BICAP);  Surgeon: Mauri Pole, MD;  Location: [facility] END[facility];  Service: Endoscopy;  Laterality: N/A;  . INTRAVASCULAR PRESSURE WIRE/FFR STUDY N/A 04/13/2020   Procedure: INTRAVASCULAR PRESSURE WIRE/FFR STUDY;  Surgeon: Burnell Blanks, MD;  Location: [place] [facility];  Service: Cardiovascular;  Laterality: N/A;  . IR FLUORO GUIDE CV LINE RIGHT  02/10/2017  . IR REMOVAL TUN CV CATH W/O FL  04/01/2017  . IR US GUIDE VASC ACCESS RIGHT  02/10/2017  . IRRIGATION AND DEBRIDEMENT KNEE Left 03/12/2017   Procedure: IRRIGATION AND DEBRIDEMENT LEFT KNEE WITH WOUND VAC APPLICATION;  Surgeon: Leandrew Koyanagi, MD;  Location: [place];  Service: Orthopedics;  Laterality: Left;  . JOINT REPLACEMENT    . KNEE ARTHROSCOPY Right 08/10/2014   Procedure: ARTHROSCOPY I & D KNEE;  Surgeon: Marianna Payment, MD;  [facility]: [facility];  Service: Orthopedics;  Laterality: Right;  . KNEE ARTHROSCOPY Left 08/11/2014   Procedure: ARTHROSCOPIC WASHOUT LEFT KNEE;  Surgeon: Marianna Payment, MD;  Location: [place];  Service: Orthopedics;  Laterality: Left;  . KNEE ARTHROSCOPY Left 08/19/2014   Procedure: ARTHROSCOPIC WASHOUT LEFT KNEE;  Surgeon: Leandrew Koyanagi, MD;  Location: [place];  Service: Orthopedics;  Laterality: Left;  . KNEE ARTHROSCOPY WITH LATERAL MENISECTOMY Left 04/04/2015   Procedure: AND PARTIAL LATERAL MENISECTOMY;  Surgeon: Leandrew Koyanagi, MD;  Location: [place];  Service: Orthopedics;   Laterality: Left;  . KNEE ARTHROSCOPY WITH MEDIAL MENISECTOMY Left 04/04/2015   Procedure: LEFT KNEE ARTHROSCOPY WITH PARTIAL MEDIAL MENISCECTOMY  AND SYNOVECTOMY;  Surgeon: Leandrew Koyanagi, MD;  Location: [place];  Service: Orthopedics;  Laterality: Left;  . PERIPHERAL VASCULAR BALLOON ANGIOPLASTY  07/07/2018   Procedure: PERIPHERAL VASCULAR BALLOON ANGIOPLASTY;  Surgeon: Marty Heck, MD;  Location: [place] [facility];  Service: Cardiovascular;;  right AV fistula  . POLYPECTOMY    . RIGHT/LEFT HEART CATH AND CORONARY ANGIOGRAPHY N/A 04/13/2020   Procedure: RIGHT/LEFT HEART CATH AND CORONARY ANGIOGRAPHY;  Surgeon: Burnell Blanks, MD;  Location: [place] [facility];  Service: Cardiovascular;  Laterality: N/A;  . SHOULDER ARTHROSCOPY Bilateral 08/10/2014   Procedure: I & D BILATERAL SHOULDERS ;  Surgeon: Marianna Payment, MD;  [facility];  Service: Orthopedics;  Laterality: Bilateral;  . SMALL INTESTINE SURGERY     Due to Small Bowel Obstruction; "fixed it when they did my gallbladder OR"  . TEE WITHOUT CARDIOVERSION N/A 08/14/2014   Procedure: TRANSESOPHAGEAL ECHOCARDIOGRAM (TEE);  Surgeon: Thayer Headings, MD;  [facility];  Service: Cardiovascular;  Laterality: N/A;  . TENOSYNOVECTOMY Right 08/11/2014   Procedure: RIGHT WRIST IRRIGATION AND DEBRIDEMENT, TENOSYNOVECTOMY;  Surgeon: Marianna Payment, MD;  Location: [place];  Service: Orthopedics;  Laterality: Right;  . TOTAL KNEE ARTHROPLASTY Left 11/07/2015   Procedure: LEFT TOTAL KNEE ARTHROPLASTY WITH REVISION OF IMPLANTS;  Surgeon: Leandrew Koyanagi, MD;  Location: [place];  Service: Orthopedics;  Laterality: Left;  . TUBAL LIGATION       reports that she has been smoking cigarettes. She has a 17.50 pack-year smoking history. She has never used smokeless tobacco. She reports previous alcohol use. She reports that she does not use drugs.  Allergies  Allergen Reactions  . Compazine [Prochlorperazine]  Shortness Of Breath, Swelling and Other (See Comments)    TONGUE SWELLS  . Shellfish-Derived Products Anaphylaxis  . Iodinated Diagnostic Agents Hives and Rash  . Omnipaque [Iohexol] Hives  . Tape     Brown paper tape caused skin irritation  . Sulfa Antibiotics Rash    Family History  Problem Relation Age of Onset  . Uterine cancer Mother   . Heart disease Father   . Hypertension Sister   . Uterine cancer Sister   . Cancer Brother        unsure type  . Diabetes Brother   . Hypertension Brother   . Colon cancer Neg Hx   . Colon polyps Neg Hx   . Esophageal cancer Neg Hx   . Rectal cancer Neg Hx   . Stomach cancer Neg Hx     Prior to Admission medications   Medication Sig Start Date End Date Taking? Authorizing Provider  Accu-Chek Softclix Lancets lancets Check sugars three times a day for E11.65 09/20/18   [name], MD  acetaminophen (TYLENOL) 500 MG tablet Take 500-1,000 mg by mouth every 6 (six) hours as needed (for pain.).    [provider]  atorvastatin (LIPITOR) 10 MG tablet Take 1 tablet (10 mg total) by mouth at bedtime. 06/13/20   [name], MD  blood glucose meter kit and supplies KIT Dispense based on patient and insurance preference. Use up to four times daily as directed 10/13/19   [name], MD  Blood Glucose Monitoring Suppl (ACCU-CHEK GUIDE) w/Device KIT 1 Device by Does not apply route daily. 10/31/19   [name], MD  calcium acetate (PHOSLO) 667 MG capsule Take 1,334-2,001 mg by mouth See admin instructions. Take 3 capsules (2001 mg) by mouth with breakfast, take 2 capsules (1334 mg) by mouth with lunch, take 3 capsules (2001 mg) by mouth with supper & take 2 capsules (1334 mg) by mouth with snacks. 01/13/19   [provider]  carvedilol (COREG) 3.125 MG tablet Take 1 tablet (3.125 mg total) by mouth 2 (two) times daily. 04/04/20 07/03/20  Donato Heinz, MD  cloNIDine (CATAPRES) 0.1 MG tablet Take 0.1 mg by mouth See  admin instructions. Bid on non-dialysis days( m,w,f,sun) 03/29/20   [provider]  diphenhydrAMINE (BENADRYL) 25 mg capsule Take 25 mg by mouth every 6 (six) hours as needed for allergies.    [provider]  gabapentin (NEURONTIN) 100 MG capsule Take 200 mg by mouth See admin instructions. Take 2 capsules (200 mg) by mouth 3 times on Sundays, Mondays, Wednesdays, & Fridays. 11/01/18   [provider]  gabapentin (NEURONTIN) 300 MG capsule Take 300 mg by mouth See admin instructions. Take 1 capsule (300 mg) by mouth twice daily on  Tuesdays, Thursdays, & Saturdays.    [provider]  glucose blood (ACCU-CHEK GUIDE) test strip Check blood sugar up to 4 times a day 10/20/19   [name], MD  hydrALAZINE (APRESOLINE) 100 MG tablet Take 100 mg by mouth See admin instructions. Bid on non-dialysis days.( m,w,f,sun)    [provider]  isosorbide mononitrate (IMDUR) 30 MG 24 hr tablet Take 1 tablet (30 mg total) by mouth daily. 04/16/20 07/15/20  Cheryln Manly, NP  Lancet Devices [facility]) lancets Check sugars TID for E11.65 01/29/15   Lance Bosch, NP  lidocaine-prilocaine (EMLA) cream Apply 1 application topically Every Tuesday,Thursday,and Saturday with dialysis. On days of dialysis 3x week 10/25/18   [provider]  methocarbamol (ROBAXIN) 500 MG tablet Take 500 mg by mouth every 8 (eight) hours as needed for muscle spasms.  06/08/19   [provider]  Methoxy PEG-Epoetin Beta (MIRCERA IJ) Get it at dialysis 06/30/19 06/28/20  [provider]  misoprostol (CYTOTEC) 100 MCG tablet Take 1 tablet (100 mcg total) by mouth every 6 (six) hours. 05/29/20 06/28/20  Finnley Lewis, Michell Heinrich, MD  multivitamin (RENA-VIT) TABS tablet Take 1 tablet by mouth daily. 03/01/19   [provider]  nitroGLYCERIN (NITROSTAT) 0.4 MG SL tablet Place 1 tablet (0.4 mg total) under the tongue every 5 (five) minutes as needed. 04/25/20 07/24/20   Donato Heinz, MD  pantoprazole (PROTONIX) 40 MG tablet Take 1 tablet (40 mg total) by mouth 2 (two) times daily. 40 mg BID for 2 months & then once daily 05/29/20 07/28/20  Vedika Dumlao, Michell Heinrich, MD  polyethylene glycol (MIRALAX / GLYCOLAX) 17 g packet Take 17 g by mouth daily as needed for moderate constipation. 12/10/18   [name], MD  sertraline (ZOLOFT) 100 MG tablet Take 2 tablets (200 mg total) by mouth daily. 06/13/20   [name], MD  traZODone (DESYREL) 50 MG tablet Take 50 mg by mouth at bedtime as needed for sleep.  10/27/18   [provider]    Physical Exam: Vitals:   06/20/20 1342 06/20/20 1458 06/20/20 1530 06/20/20 1555  BP: (!) 103/51 107/62 (!) 106/55 (!) 103/54  Pulse: 80 76 74 73  Resp: _0 Temp: 98.8 F (37.1 C)  98.1 F (36.7 C) 98.8 F (37.1 C)  TempSrc: Oral  Oral Oral  SpO2: 96% 99% 100% 98%    Constitutional: NAD, calm, comfortable Vitals:   06/20/20 1342 06/20/20 1458 06/20/20 1530 06/20/20 1555  BP: (!) 103/51 107/62 (!) 106/55 (!) 103/54  Pulse: 80 76 74 73  Resp: _1 Temp: 98.8 F (37.1 C)  98.1 F (36.7 C) 98.8 F (37.1 C)  TempSrc: Oral  Oral Oral  SpO2: 96% 99% 100% 98%   Eyes: PERRL, lids and conjunctivae normal ENMT: Mucous membranes are moist. Posterior pharynx clear of any exudate or lesions.Normal dentition.  Neck: normal, supple, no masses, no thyromegaly Respiratory: clear to auscultation bilaterally, no wheezing, no crackles. Normal respiratory effort. No accessory muscle use.  Cardiovascular: Regular rate and rhythm, no murmurs / rubs / gallops. No extremity edema. 2+ pedal pulses. No carotid bruits.  Abdomen: Mild generalized abdominal tenderness, no masses palpated. No hepatosplenomegaly. Bowel sounds positive.  Musculoskeletal: Left AKA Skin: no rashes, lesions, ulcers. No induration Neurologic: CN 2-12 grossly intact. Sensation intact, DTR normal. Strength 5/5 in all 4.  Psychiatric:  Normal judgment and insight. Alert and oriented x 3. Normal mood.  Labs on Admission: I have personally reviewed following labs and imaging studies  CBC: Recent Labs  Lab 06/20/20 1225  WBC 4.0  HGB 5.5*  HCT 17.7*  MCV 96.2  PLT 299   Basic Metabolic Panel: Recent Labs  Lab 06/20/20 1225  NA 135  K 3.6  CL 95*  CO2 28  GLUCOSE 112*  BUN 26*  CREATININE 3.92*  CALCIUM 7.6*   GFR: Estimated Creatinine Clearance: 13.8 mL/min (A) (by C-G formula based on SCr of 3.92 mg/dL (H)). Liver Function Tests: Recent Labs  Lab 06/20/20 1225  AST 16  ALT 16  ALKPHOS 57  BILITOT 0.8  PROT 4.8*  ALBUMIN 2.3*   No results for input(s): LIPASE, AMYLASE in the last 168 hours. No results for input(s): AMMONIA in the last 168 hours. Coagulation Profile: No results for input(s): INR, PROTIME in the last 168 hours. Cardiac Enzymes: No results for input(s): CKTOTAL, CKMB, CKMBINDEX, TROPONINI in the last 168 hours. BNP (last 3 results) No results for input(s): PROBNP in the last 8760 hours. HbA1C: No results for input(s): HGBA1C in the last 72 hours. CBG: No results for input(s): GLUCAP in the last 168 hours. Lipid Profile: No results for input(s): CHOL, HDL, LDLCALC, TRIG, CHOLHDL, LDLDIRECT in the last 72 hours. Thyroid Function Tests: No results for input(s): TSH, T4TOTAL, FREET4, T3FREE, THYROIDAB in the last 72 hours. Anemia Panel: Recent Labs    06/20/20 1504  RETICCTPCT 3.5*   Urine analysis:    Component Value Date/Time   COLORURINE AMBER (A) 02/08/2017 1138   APPEARANCEUR TURBID (A) 02/08/2017 1138   LABSPEC 1.014 02/08/2017 1138   PHURINE 5.0 02/08/2017 1138   GLUCOSEU NEGATIVE 02/08/2017 1138   HGBUR MODERATE (A) 02/08/2017 1138   BILIRUBINUR NEGATIVE 02/08/2017 1138   KETONESUR NEGATIVE 02/08/2017 1138   PROTEINUR 100 (A) 02/08/2017 1138   UROBILINOGEN 0.2 09/02/2014 1504   NITRITE NEGATIVE 02/08/2017 1138   LEUKOCYTESUR LARGE (A) 02/08/2017 1138     Radiological Exams on Admission: No results found.  EKG: Independently reviewed.  It shows prolonged QTC.  Assessment/Plan Active Problems:   Rectal bleeding   Bright red blood per rectum/lower GI bleeding/acute blood loss anemia: As mentioned above, patient's hemoglobin is 5.5 and she will be transfused 2 units of PRBC.  She was just seen by Liliane Shi, NP of GI who had done the rectal exam on her and per her, patient has hemorrhoids on exam which were known and patient was prescribed Anusol by her GI doctor which she never picked up.  Per her, there is likely no plan for any scope however she is going to touch base with her attending in the meantime I will start this patient on clear liquid diet and will keep her n.p.o. starting midnight.  Repeat H&H after transfusion and in the morning and transfuse to keep hemoglobin over 7.  ESRD: TTS schedule.  Will consult nephrology for dialysis tomorrow.  Essential hypertension: Controlled.  Resume all home medications which include Coreg, clonidine, hydralazine and Imdur.  Hyperlipidemia: Resume home dose of atorvastatin.  History of chronic diastolic congestive heart failure: Appears euvolemic to resume home medications which includes Coreg.  Depression/anxiety: Patient has prolonged QTC today which is new finding.  Will avoid QTC prolonging agents and thus will hold home dose of Zoloft and trazodone.  GERD with esophagitis: Due to prolonged QTC, holding PPI for now as she is having lower GI bleed at this point in time.  Type 2 diabetes mellitus: Will  start on SSI.  DVT prophylaxis: heparin injection 5,000 Units Start: 06/20/20 1630 Code Status: Full code Family Communication: None present at bedside.  Plan of care discussed with patient in length and he verbalized understanding and agreed with it. Disposition Plan: Probably discharge in next 1 to 2 days Consults called: GI Admission status: Observation   Status is:  Observation  Darliss Cheney MD [facility]  06/20/2020, 4:39 PM  To contact the attending provider between 7A-7P or the covering provider during after hours 7P-7A, please log into the web site [email or web address]

## 2020-06-20 NOTE — ED Provider Notes (Signed)
[facility] EMERGENCY DEPARTMENT Provider Note   CSN: [id number] Arrival date & time: 06/20/20  1119     History Chief Complaint  Patient presents with  . Rectal Bleeding    Brandy Houston is a 65 y.o. female.  65 y.o female with a PMH of CAD, Anemia ESRD on dialysis (THS) last session yesterday and completed presents to the ED with a chief complaint of rectal bleeding x 4 days. Reports this occurs during each bowel movement, has noted more blood and endorses episodes of diarrhea. She had a scope performed on 05/14/2020 by Dr. Burna Mortimer with [facility]. She also endorses centralized chest pressure which occurred for "a few minutes" last night and was relieve by nitroglycerin.In addition, patient arrived in the ED febrile with a tempeture in the 100's. Reports overall weakness. No shortness of breath or other complaints. Not on any blood thinners, no NSAIDS use.    The history is provided by the patient.  Rectal Bleeding Quality:  Bright red Amount:  Copious Duration:  4 days Timing:  Constant Chronicity:  New Context: diarrhea and hemorrhoids   Context: not anal penetration, not constipation and not rectal pain   Similar prior episodes: no   Relieved by:  Nothing Worsened by:  Defecation Ineffective treatments:  None tried Associated symptoms: fever and light-headedness   Associated symptoms: no abdominal pain, no hematemesis and no vomiting   Risk factors: no anticoagulant use, no hx of colorectal cancer, no hx of colorectal surgery, no liver disease, no NSAID use and no steroid use        Past Medical History:  Diagnosis Date  . Allergy   . Anemia   . Anxiety   . Arthritis    "in my joints" (03/10/2017)  . Chronic diastolic CHF (congestive heart failure) ([place])   . CKD (chronic kidney disease), stage IV ([place])    stage IV. previous HD, none currently 10/30/15 (confirmed 02/05/2017 & 03/10/2017)  . Depression    Chronic  . Diabetic peripheral neuropathy  ([place])   . Dysrhythmia    tachycardia, normal ECHO 08-09-14  . GERD (gastroesophageal reflux disease)   . Heart murmur     dx'd 02/05/2017  . Hepatitis C    "tx'd in 2016; I'm negative now" (02/05/2017)  . History of blood transfusion 2017   "w/knee replacement"  . Hyperlipidemia   . Hypertension   . Osteoarthritis of left knee   . Peripheral neuropathy   . Protein calorie malnutrition ([place])   . Septic arthritis ([place])   . Slow transit constipation   . Symptomatic anemia 01/27/2020  . Type II diabetes mellitus ([name])    IDDM  . Uncontrolled hypertension 02/18/2013  . Unsteady gait     Patient Active Problem List   Diagnosis Date Noted  . Coronary artery disease of native artery of native heart with stable angina pectoris ([place]) 06/13/2020  . Nonrheumatic aortic valve insufficiency 06/13/2020  . Nonrheumatic tricuspid valve regurgitation 06/13/2020  . GAVE (gastric antral vascular ectasia)   . AVM (arteriovenous malformation) of small bowel, acquired with hemorrhage   . Hip swelling, right 05/27/2020  . Hypokalemia 05/27/2020  . Anemia of chronic renal failure 05/27/2020  . 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 Acute combined systolic and diastolic heart failure ([place])   . Unstable angina ([place])   . Symptomatic anemia 01/27/2020  . Hypercalcemia 10/06/2019  . QT prolongation 06/05/2019  . Moderate protein-calorie malnutrition ([place]) 05/11/2018  .  End stage renal disease ([place]) 04/19/2018  . Iron deficiency anemia, unspecified 04/19/2018  . Nephrotic syndrome with unspecified morphologic changes 04/19/2018  . Secondary hyperparathyroidism of renal origin ([place]) 04/19/2018  . Nicotine dependence, cigarettes, uncomplicated 04/12/2535  . Benign essential HTN   . Above knee amputation status, left   . Anemia, blood loss   . DM type 2 causing ESRD ([place]) 03/21/2017  . Anemia in chronic kidney disease 02/05/2017  . Chronic diastolic CHF (congestive heart  failure) ([place]) 11/08/2015  . Diabetic peripheral neuropathy ([place])   . Major depression, recurrent, chronic ([place])   . Chronic kidney disease (CKD), stage IV (severe) ([place])   . Hepatitis C 08/16/2014  . Peripheral neuropathy 06/24/2013    Past Surgical History:  Procedure Laterality Date  . A/V FISTULAGRAM Right 07/07/2018   Procedure: A/V FISTULAGRAM;  Surgeon: Marty Heck, MD;  Location: [facility];  Service: Cardiovascular;  Laterality: Right;  . AMPUTATION Left 03/20/2017   Procedure: LEFT ABOVE KNEE AMPUTATION;  Surgeon: Newt Minion, MD;  Location: [facility];  Service: Orthopedics;  Laterality: Left;  . APPENDECTOMY    . AV FISTULA PLACEMENT Left 09/13/2014   Procedure: Brachial Artery to Brachial Vein Gortex Four - Seven Stretch GRAFT INSERTION Left Forearm;  Surgeon: Mal Misty, MD;  Location: [facility];  Service: Vascular;  Laterality: Left;  . BASCILIC VEIN TRANSPOSITION Right 10/26/2017   Procedure: BASILIC VEIN TRANSPOSITION FIRST STAGE RIGHT ARM;  Surgeon: Conrad Tony, MD;  Location: [facility];  Service: Vascular;  Laterality: Right;  . BASCILIC VEIN TRANSPOSITION Right 02/01/2018   Procedure: SECOND STAGE BASILIC VEIN TRANSPOSITION RIGHT UPPER EXTREMITY;  Surgeon: Marty Heck, MD;  Location: [facility];  Service: Vascular;  Laterality: Right;  . CHOLECYSTECTOMY OPEN    . COLON SURGERY    . COLONOSCOPY    . ENTEROSCOPY N/A 05/28/2020   Procedure: ENTEROSCOPY;  Surgeon: Mauri Pole, MD;  Location[facility];  Service: Endoscopy;  Laterality: N/A;  . ESOPHAGOGASTRODUODENOSCOPY (EGD) WITH PROPOFOL N/A 05/14/2020   Procedure: ESOPHAGOGASTRODUODENOSCOPY (EGD) WITH PROPOFOL;  Surgeon: Mauri Pole, MD;  Location: [facility];  Service: Endoscopy;  Laterality: N/A;  with APC  . EXCISIONAL TOTAL KNEE ARTHROPLASTY WITH ANTIBIOTIC SPACERS Left 02/06/2017   Procedure: Incisional total iknee with antibiotic spacer ;  Surgeon: Leandrew Koyanagi, MD;  Location: [facility];  Service: Orthopedics;  Laterality: Left;  . HOT HEMOSTASIS N/A 05/14/2020   Procedure: HOT HEMOSTASIS (ARGON PLASMA COAGULATION/BICAP);  Surgeon: Mauri Pole, MD;  Location: [facility];  Service: Endoscopy;  Laterality: N/A;  . HOT HEMOSTASIS N/A 05/28/2020   Procedure: HOT HEMOSTASIS (ARGON PLASMA COAGULATION/BICAP);  Surgeon: Mauri Pole, MD;  Location: [facility] ENDOSCOPY;  Service: Endoscopy;  Laterality: N/A;  . INTRAVASCULAR PRESSURE WIRE/FFR STUDY N/A 04/13/2020   Procedure: INTRAVASCULAR PRESSURE WIRE/FFR STUDY;  Surgeon: Burnell Blanks, MD;  Location: [facility];  Service: Cardiovascular;  Laterality: N/A;  . IR FLUORO GUIDE CV LINE RIGHT  02/10/2017  . IR REMOVAL TUN CV CATH W/O FL  04/01/2017  . IR US GUIDE VASC ACCESS RIGHT  02/10/2017  . IRRIGATION AND DEBRIDEMENT KNEE Left 03/12/2017   Procedure: IRRIGATION AND DEBRIDEMENT LEFT KNEE WITH WOUND VAC APPLICATION;  Surgeon: Leandrew Koyanagi, MD;  Location: [facility];  Service: Orthopedics;  Laterality: Left;  . JOINT REPLACEMENT    . KNEE ARTHROSCOPY Right 08/10/2014   Procedure: ARTHROSCOPY I & D KNEE;  Surgeon: Marianna Payment,  MD;  [facility]: [facility];  Service: Orthopedics;  Laterality: Right;  . KNEE ARTHROSCOPY Left 08/11/2014   Procedure: ARTHROSCOPIC WASHOUT LEFT KNEE;  Surgeon: Marianna Payment, MD;  Location: [facility];  Service: Orthopedics;  Laterality: Left;  . KNEE ARTHROSCOPY Left 08/19/2014   Procedure: ARTHROSCOPIC WASHOUT LEFT KNEE;  Surgeon: Leandrew Koyanagi, MD;  Location: [facility];  Service: Orthopedics;  Laterality: Left;  . KNEE ARTHROSCOPY WITH LATERAL MENISECTOMY Left 04/04/2015   Procedure: AND PARTIAL LATERAL MENISECTOMY;  Surgeon: Leandrew Koyanagi, MD;  Location: [facility];  Service: Orthopedics;  Laterality: Left;  . KNEE ARTHROSCOPY WITH MEDIAL MENISECTOMY Left 04/04/2015   Procedure: LEFT KNEE ARTHROSCOPY WITH PARTIAL MEDIAL MENISCECTOMY  AND SYNOVECTOMY;  Surgeon: Leandrew Koyanagi, MD;   Location: [facility];  Service: Orthopedics;  Laterality: Left;  . PERIPHERAL VASCULAR BALLOON ANGIOPLASTY  07/07/2018   Procedure: PERIPHERAL VASCULAR BALLOON ANGIOPLASTY;  Surgeon: Marty Heck, MD;  Location: [facility];  Service: Cardiovascular;;  right AV fistula  . POLYPECTOMY    . RIGHT/LEFT HEART CATH AND CORONARY ANGIOGRAPHY N/A 04/13/2020   Procedure: RIGHT/LEFT HEART CATH AND CORONARY ANGIOGRAPHY;  Surgeon: Burnell Blanks, MD;  Location: [facility];  Service: Cardiovascular;  Laterality: N/A;  . SHOULDER ARTHROSCOPY Bilateral 08/10/2014   Procedure: I & D BILATERAL SHOULDERS ;  Surgeon: Marianna Payment, MD;  [facility];  Service: Orthopedics;  Laterality: Bilateral;  . SMALL INTESTINE SURGERY     Due to Small Bowel Obstruction; "fixed it when they did my gallbladder OR"  . TEE WITHOUT CARDIOVERSION N/A 08/14/2014   Procedure: TRANSESOPHAGEAL ECHOCARDIOGRAM (TEE);  Surgeon: Thayer Headings, MD;  Location: [facility];  Service: Cardiovascular;  Laterality: N/A;  . TENOSYNOVECTOMY Right 08/11/2014   Procedure: RIGHT WRIST IRRIGATION AND DEBRIDEMENT, TENOSYNOVECTOMY;  Surgeon: Marianna Payment, MD;  Location: [facility];  Service: Orthopedics;  Laterality: Right;  . TOTAL KNEE ARTHROPLASTY Left 11/07/2015   Procedure: LEFT TOTAL KNEE ARTHROPLASTY WITH REVISION OF IMPLANTS;  Surgeon: Leandrew Koyanagi, MD;  Location: [facility];  Service: Orthopedics;  Laterality: Left;  . TUBAL LIGATION       OB History    Gravida  5   Para  0   Term  0   Preterm      AB  2   Living  3     SAB  1   IAB  1   Ectopic      Multiple      Live Births              Family History  Problem Relation Age of Onset  . Uterine cancer Mother   . Heart disease Father   . Hypertension Sister   . Uterine cancer Sister   . Cancer Brother        unsure type  . Diabetes Brother   . Hypertension Brother   . Colon cancer Neg Hx   . Colon polyps Neg  Hx   . Esophageal cancer Neg Hx   . Rectal cancer Neg Hx   . Stomach cancer Neg Hx     Social History   Tobacco Use  . Smoking status: Current Every Day Smoker    Packs/day: 0.50    Years: 35.00    Pack years: 17.50    Types: Cigarettes  . Smokeless tobacco: Never Used  Vaping Use  . Vaping Use: Never used  Substance Use Topics  .  Alcohol use: Not Currently    Alcohol/week: 0.0 standard drinks    Comment: 03/10/2017 "I drink a wine cooler a few times/year"  . Drug use: No    Home Medications Prior to Admission medications   Medication Sig Start Date End Date Taking? Authorizing Provider  Accu-Chek Softclix Lancets lancets Check sugars three times a day for E11.65 09/20/18   [name], MD  acetaminophen (TYLENOL) 500 MG tablet Take 500-1,000 mg by mouth every 6 (six) hours as needed (for pain.).    [provider]  atorvastatin (LIPITOR) 10 MG tablet Take 1 tablet (10 mg total) by mouth at bedtime. 06/13/20   [name], MD  blood glucose meter kit and supplies KIT Dispense based on patient and insurance preference. Use up to four times daily as directed 10/13/19   [name], MD  Blood Glucose Monitoring Suppl (ACCU-CHEK GUIDE) w/Device KIT 1 Device by Does not apply route daily. 10/31/19   [name], MD  calcium acetate (PHOSLO) 667 MG capsule Take 1,334-2,001 mg by mouth See admin instructions. Take 3 capsules (2001 mg) by mouth with breakfast, take 2 capsules (1334 mg) by mouth with lunch, take 3 capsules (2001 mg) by mouth with supper & take 2 capsules (1334 mg) by mouth with snacks. 01/13/19   [provider]  carvedilol (COREG) 3.125 MG tablet Take 1 tablet (3.125 mg total) by mouth 2 (two) times daily. 04/04/20 07/03/20  Donato Heinz, MD  cloNIDine (CATAPRES) 0.1 MG tablet Take 0.1 mg by mouth See admin instructions. Bid on non-dialysis days( m,w,f,sun) 03/29/20   [provider]  diphenhydrAMINE (BENADRYL) 25 mg capsule  Take 25 mg by mouth every 6 (six) hours as needed for allergies.    [provider]  gabapentin (NEURONTIN) 100 MG capsule Take 200 mg by mouth See admin instructions. Take 2 capsules (200 mg) by mouth 3 times on Sundays, Mondays, Wednesdays, & Fridays. 11/01/18   [provider]  gabapentin (NEURONTIN) 300 MG capsule Take 300 mg by mouth See admin instructions. Take 1 capsule (300 mg) by mouth twice daily on Tuesdays, Thursdays, & Saturdays.    [provider]  glucose blood (ACCU-CHEK GUIDE) test strip Check blood sugar up to 4 times a day 10/20/19   [name], MD  hydrALAZINE (APRESOLINE) 100 MG tablet Take 100 mg by mouth See admin instructions. Bid on non-dialysis days.( m,w,f,sun)    [provider]  isosorbide mononitrate (IMDUR) 30 MG 24 hr tablet Take 1 tablet (30 mg total) by mouth daily. 04/16/20 07/15/20  Cheryln Manly, NP  Lancet Devices [facility]) lancets Check sugars TID for E11.65 01/29/15   Lance Bosch, NP  lidocaine-prilocaine (EMLA) cream Apply 1 application topically Every Tuesday,Thursday,and Saturday with dialysis. On days of dialysis 3x week 10/25/18   [provider]  methocarbamol (ROBAXIN) 500 MG tablet Take 500 mg by mouth every 8 (eight) hours as needed for muscle spasms.  06/08/19   [provider]  Methoxy PEG-Epoetin Beta (MIRCERA IJ) Get it at dialysis 06/30/19 06/28/20  [provider]  misoprostol (CYTOTEC) 100 MCG tablet Take 1 tablet (100 mcg total) by mouth every 6 (six) hours. 05/29/20 06/28/20  Pahwani, Michell Heinrich, MD  multivitamin (RENA-VIT) TABS tablet Take 1 tablet by mouth daily. 03/01/19   [provider]  nitroGLYCERIN (NITROSTAT) 0.4 MG SL tablet Place 1 tablet (0.4 mg total) under the tongue every 5 (five) minutes as needed. 04/25/20 07/24/20  Donato Heinz, MD  pantoprazole (PROTONIX) 40 MG tablet Take 1 tablet (40 mg total) by mouth 2 (two) times daily. 40 mg BID  for 2 months & then once daily 05/29/20 07/28/20  Pahwani, Michell Heinrich, MD  polyethylene glycol (MIRALAX / GLYCOLAX) 17 g packet Take 17 g by mouth daily as needed for moderate constipation. 12/10/18   [name], MD  sertraline (ZOLOFT) 100 MG tablet Take 2 tablets (200 mg total) by mouth daily. 06/13/20   [name], MD  traZODone (DESYREL) 50 MG tablet Take 50 mg by mouth at bedtime as needed for sleep.  10/27/18   [provider]    Allergies    Compazine [prochlorperazine], Shellfish-derived products, Iodinated diagnostic agents, Omnipaque [iohexol], Tape, and Sulfa antibiotics  Review of Systems   Review of Systems  Constitutional: Positive for fever.  Respiratory: Negative for shortness of breath.   Cardiovascular: Positive for chest pain.  Gastrointestinal: Positive for blood in stool and hematochezia. Negative for abdominal pain, constipation, diarrhea, hematemesis and vomiting.  Genitourinary: Negative for flank pain.  Musculoskeletal: Negative for back pain.  Neurological: Positive for light-headedness. Negative for headaches.  All other systems reviewed and are negative.   Physical Exam Updated Vital Signs BP (!) 103/54   Pulse 73   Temp 98.8 F (37.1 C) (Oral)   Resp 15   LMP 03/16/2002   SpO2 98%   Physical Exam Vitals and nursing note reviewed. Exam conducted with a chaperone present.  Constitutional:      Appearance: Normal appearance. She is not ill-appearing.  HENT:     Head: Normocephalic and atraumatic.  Pulmonary:     Effort: Pulmonary effort is normal.     Breath sounds: No wheezing or rales.  Abdominal:     Palpations: Abdomen is soft.     Tenderness: There is abdominal tenderness.  Genitourinary:    Rectum: Guaiac result positive. External hemorrhoid present. No mass, tenderness, anal fissure or internal hemorrhoid. Normal anal tone.     Comments: Chaperone by Domenick Bookbinder.  Musculoskeletal:     Cervical back: Normal range of motion and  neck supple.     Left Lower Extremity: Left leg is amputated above knee.  Skin:    General: Skin is warm and dry.     Coloration: Skin is pale.  Neurological:     Mental Status: She is alert and oriented to person, place, and time.     ED Results / Procedures / Treatments   Labs (all labs ordered are listed, but only abnormal results are displayed) Labs Reviewed  COMPREHENSIVE METABOLIC PANEL - Abnormal; Notable for the following components:      Result Value   Chloride 95 (*)    Glucose, Bld 112 (*)    BUN 26 (*)    Creatinine, Ser 3.92 (*)    Calcium 7.6 (*)    Total Protein 4.8 (*)    Albumin 2.3 (*)    GFR, Estimated 12 (*)    All other components within normal limits  CBC - Abnormal; Notable for the following components:   RBC 1.84 (*)    Hemoglobin 5.5 (*)    HCT 17.7 (*)    RDW 17.7 (*)    All other components within normal limits  RETICULOCYTES - Abnormal; Notable for the following components:   Retic Ct Pct 3.5 (*)    RBC. 2.53 (*)    Immature Retic Fract 28.1 (*)    All other components within  normal limits  POC OCCULT BLOOD, ED - Abnormal; Notable for the following components:   Fecal Occult Bld POSITIVE (*)    All other components within normal limits  SARS CORONAVIRUS 2 (TAT 6-24 HRS)  VITAMIN B12  FOLATE  IRON AND TIBC  FERRITIN  TYPE AND SCREEN  PREPARE RBC (CROSSMATCH)    EKG EKG Interpretation  Date/Time:  Wednesday June 20 2020 14:48:10 EST Ventricular Rate:  76 PR Interval:    QRS Duration: 97 QT Interval:  459 QTC Calculation: 517 R Axis:   65 Text Interpretation: Sinus or ectopic atrial rhythm Short PR interval Borderline repolarization abnormality ST elevation, consider lateral injury Prolonged QT interval Since last tracing QT has lengthened Confirmed by Calvert Cantor [phone]) on 06/20/2020 3:00:34 PM   Radiology No results found.  Procedures .Critical Care Performed by: Janeece Fitting, PA-C Authorized by: Janeece Fitting, PA-C    Critical care provider statement:    Critical care time (minutes):  45   Critical care start time:  06/20/2020 3:00 PM   Critical care end time:  06/20/2020 3:45 PM   Critical care time was exclusive of:  Separately billable procedures and treating other patients   Critical care was necessary to treat or prevent imminent or life-threatening deterioration of the following conditions:  Circulatory failure   Critical care was time spent personally by me on the following activities:  Blood draw for specimens, development of treatment plan with patient or surrogate, discussions with consultants, evaluation of patient's response to treatment, examination of patient, obtaining history from patient or surrogate, ordering and performing treatments and interventions, ordering and review of laboratory studies, ordering and review of radiographic studies, pulse oximetry, re-evaluation of patient's condition and review of old charts   (including critical care time)  Medications Ordered in ED Medications  acetaminophen (TYLENOL) tablet 650 mg (has no administration in time range)  0.9 %  sodium chloride infusion (Manually program via Guardrails IV Fluids) ( Intravenous New Bag/Given 06/20/20 1530)    ED Course  I have reviewed the triage vital signs and the nursing notes.  Pertinent labs & imaging results that were available during my care of the patient were reviewed by me and considered in my medical decision making (see chart for details).  Clinical Course as of 06/20/20 1622  Wed Jun 20, 2020  1520 Hemoglobin(!!): 5.5 [JS]  1520 4 point drop from her previous 3 weeks ago.  [JS]  4970 Fecal Occult Blood, POC(!): POSITIVE [JS]    Clinical Course User Index [JS] Janeece Fitting, PA-C   MDM Rules/Calculators/A&P    Patient with multiple history of chronic anemia, ESRD on dialysis Tuesday Thursday Saturday last dialyzed yesterday presents to the ED today with a chief complaint of rectal bleeding for the  past 4 days.  Previously scoped by Dr. Sheran Spine on follow-up of our GI.  Reports multiple clots bowel movement some diarrhea.  She arrived in the ED febrile, temp of 103.5, reports no prior ER.  Extensive chart review does show contacted GI obtain further recommendations from her rectal bleeding, hemoglobin was found to be 5.5 while in the waiting room.  I evaluated patient when she arrived to the room, she is overall chronically ill-appearing, moves all extremities, exam is benign.  Does work some abdominal discomfort with palpation of the left side of her abdomen.  Reports completing dialysis yesterday.  Was given Tylenol to help with her fever during arrival to the ED.  Hemoccult grossly positive on my exam, chaperoned  by Domenick Bookbinder.   Interpretation of her labs to me reveal a CMP without any electrolyte abnormality, she was dialyzed yesterday her creatinine is improved from her baseline.  CBC with a drop in her hemoglobin before in the past 3 weeks currently 5.5, she was educated on the blood transfusion, we discussed transfusing at this time.  Anemia panel has been ordered.  3:19 PM Called place to [facility] for further recommendations.  3:57 PM Spoke to [facility] who will evaluate patient while in the hospital.   Patient remains hemodynamically stable, will place for hospitalist for further admission at this time.  4:22 PM Spoke to Dr. Dan Maker who will admit patient for further management.    Brandy Houston was evaluated in Emergency Department on 06/20/2020 for the symptoms described in the history of present illness. She was evaluated in the context of the global COVID-19 pandemic, which necessitated consideration that the patient might be at risk for infection with the SARS-CoV-2 virus that causes COVID-19. Institutional protocols and algorithms that pertain to the evaluation of patients at risk for COVID-19 are in a state of rapid change based on information released by regulatory  bodies including the CDC and federal and state organizations. These policies and algorithms were followed during the patient's care in the ED.   Portions of this note were generated with [employer]. Dictation errors may occur despite best attempts at proofreading.  Final Clinical Impression(s) / ED Diagnoses Final diagnoses:  Rectal bleeding  Symptomatic anemia    Rx / DC Orders ED Discharge Orders    None       Janeece Fitting, PA-C 06/20/20 1622    Truddie Hidden, MD 06/21/20 [phone]

## 2020-06-20 NOTE — ED Triage Notes (Signed)
Pt reports bright red blood in her stools for the past 3 days, states she had a scope done about 3-4 weeks ago, saw some bleeding but states "they took care of it". Pt denies abd pain. Hx of anemia

## 2020-06-20 NOTE — Progress Notes (Signed)
Lab called  Pt COVID test positive awaiting for unit transfer

## 2020-06-20 NOTE — ED Notes (Signed)
ED Provider at bedside. 

## 2020-06-21 ENCOUNTER — Encounter (HOSPITAL_COMMUNITY): Payer: Self-pay | Admitting: Family Medicine

## 2020-06-21 ENCOUNTER — Inpatient Hospital Stay (HOSPITAL_COMMUNITY): Payer: Medicare Other

## 2020-06-21 DIAGNOSIS — K648 Other hemorrhoids: Secondary | ICD-10-CM | POA: Diagnosis present

## 2020-06-21 DIAGNOSIS — E1142 Type 2 diabetes mellitus with diabetic polyneuropathy: Secondary | ICD-10-CM

## 2020-06-21 DIAGNOSIS — Z8719 Personal history of other diseases of the digestive system: Secondary | ICD-10-CM | POA: Diagnosis not present

## 2020-06-21 DIAGNOSIS — K5521 Angiodysplasia of colon with hemorrhage: Secondary | ICD-10-CM | POA: Diagnosis not present

## 2020-06-21 DIAGNOSIS — I5032 Chronic diastolic (congestive) heart failure: Secondary | ICD-10-CM | POA: Diagnosis present

## 2020-06-21 DIAGNOSIS — E440 Moderate protein-calorie malnutrition: Secondary | ICD-10-CM | POA: Diagnosis not present

## 2020-06-21 DIAGNOSIS — E876 Hypokalemia: Secondary | ICD-10-CM | POA: Diagnosis not present

## 2020-06-21 DIAGNOSIS — U071 COVID-19: Secondary | ICD-10-CM

## 2020-06-21 DIAGNOSIS — E1151 Type 2 diabetes mellitus with diabetic peripheral angiopathy without gangrene: Secondary | ICD-10-CM | POA: Diagnosis present

## 2020-06-21 DIAGNOSIS — D649 Anemia, unspecified: Secondary | ICD-10-CM | POA: Diagnosis not present

## 2020-06-21 DIAGNOSIS — D500 Iron deficiency anemia secondary to blood loss (chronic): Secondary | ICD-10-CM | POA: Diagnosis not present

## 2020-06-21 DIAGNOSIS — K5520 Angiodysplasia of colon without hemorrhage: Secondary | ICD-10-CM | POA: Diagnosis not present

## 2020-06-21 DIAGNOSIS — Z91041 Radiographic dye allergy status: Secondary | ICD-10-CM | POA: Diagnosis not present

## 2020-06-21 DIAGNOSIS — B3781 Candidal esophagitis: Secondary | ICD-10-CM | POA: Diagnosis present

## 2020-06-21 DIAGNOSIS — D631 Anemia in chronic kidney disease: Secondary | ICD-10-CM | POA: Diagnosis present

## 2020-06-21 DIAGNOSIS — Z888 Allergy status to other drugs, medicaments and biological substances status: Secondary | ICD-10-CM | POA: Diagnosis not present

## 2020-06-21 DIAGNOSIS — K259 Gastric ulcer, unspecified as acute or chronic, without hemorrhage or perforation: Secondary | ICD-10-CM | POA: Diagnosis present

## 2020-06-21 DIAGNOSIS — Z992 Dependence on renal dialysis: Secondary | ICD-10-CM | POA: Diagnosis not present

## 2020-06-21 DIAGNOSIS — I2510 Atherosclerotic heart disease of native coronary artery without angina pectoris: Secondary | ICD-10-CM | POA: Diagnosis not present

## 2020-06-21 DIAGNOSIS — E1129 Type 2 diabetes mellitus with other diabetic kidney complication: Secondary | ICD-10-CM | POA: Diagnosis not present

## 2020-06-21 DIAGNOSIS — K31811 Angiodysplasia of stomach and duodenum with bleeding: Secondary | ICD-10-CM | POA: Diagnosis present

## 2020-06-21 DIAGNOSIS — Z882 Allergy status to sulfonamides status: Secondary | ICD-10-CM | POA: Diagnosis not present

## 2020-06-21 DIAGNOSIS — K31819 Angiodysplasia of stomach and duodenum without bleeding: Secondary | ICD-10-CM | POA: Diagnosis not present

## 2020-06-21 DIAGNOSIS — E1122 Type 2 diabetes mellitus with diabetic chronic kidney disease: Secondary | ICD-10-CM | POA: Diagnosis present

## 2020-06-21 DIAGNOSIS — K602 Anal fissure, unspecified: Secondary | ICD-10-CM | POA: Diagnosis not present

## 2020-06-21 DIAGNOSIS — K625 Hemorrhage of anus and rectum: Secondary | ICD-10-CM | POA: Diagnosis not present

## 2020-06-21 DIAGNOSIS — R9431 Abnormal electrocardiogram [ECG] [EKG]: Secondary | ICD-10-CM

## 2020-06-21 DIAGNOSIS — I132 Hypertensive heart and chronic kidney disease with heart failure and with stage 5 chronic kidney disease, or end stage renal disease: Secondary | ICD-10-CM | POA: Diagnosis present

## 2020-06-21 DIAGNOSIS — Z89612 Acquired absence of left leg above knee: Secondary | ICD-10-CM | POA: Diagnosis not present

## 2020-06-21 DIAGNOSIS — Z91048 Other nonmedicinal substance allergy status: Secondary | ICD-10-CM | POA: Diagnosis not present

## 2020-06-21 DIAGNOSIS — K922 Gastrointestinal hemorrhage, unspecified: Secondary | ICD-10-CM | POA: Diagnosis not present

## 2020-06-21 DIAGNOSIS — Z91013 Allergy to seafood: Secondary | ICD-10-CM | POA: Diagnosis not present

## 2020-06-21 DIAGNOSIS — N186 End stage renal disease: Secondary | ICD-10-CM

## 2020-06-21 DIAGNOSIS — I509 Heart failure, unspecified: Secondary | ICD-10-CM | POA: Diagnosis not present

## 2020-06-21 DIAGNOSIS — D62 Acute posthemorrhagic anemia: Secondary | ICD-10-CM | POA: Diagnosis present

## 2020-06-21 DIAGNOSIS — Z7984 Long term (current) use of oral hypoglycemic drugs: Secondary | ICD-10-CM | POA: Diagnosis not present

## 2020-06-21 DIAGNOSIS — K2950 Unspecified chronic gastritis without bleeding: Secondary | ICD-10-CM | POA: Diagnosis present

## 2020-06-21 DIAGNOSIS — I25118 Atherosclerotic heart disease of native coronary artery with other forms of angina pectoris: Secondary | ICD-10-CM | POA: Diagnosis present

## 2020-06-21 DIAGNOSIS — K5790 Diverticulosis of intestine, part unspecified, without perforation or abscess without bleeding: Secondary | ICD-10-CM | POA: Diagnosis present

## 2020-06-21 LAB — CBC
HCT: 25.8 % — ABNORMAL LOW (ref 36.0–46.0)
HCT: 27.3 % — ABNORMAL LOW (ref 36.0–46.0)
Hemoglobin: 8.2 g/dL — ABNORMAL LOW (ref 12.0–15.0)
Hemoglobin: 8.8 g/dL — ABNORMAL LOW (ref 12.0–15.0)
MCH: 29.6 pg (ref 26.0–34.0)
MCH: 29.4 pg (ref 26.0–34.0)
MCHC: 31.8 g/dL (ref 30.0–36.0)
MCHC: 32.2 g/dL (ref 30.0–36.0)
MCV: 93.1 fL (ref 80.0–100.0)
MCV: 91.3 fL (ref 80.0–100.0)
Platelets: 174 10*3/uL (ref 150–400)
Platelets: 207 10*3/uL (ref 150–400)
RBC: 2.77 MIL/uL — ABNORMAL LOW (ref 3.87–5.11)
RBC: 2.99 MIL/uL — ABNORMAL LOW (ref 3.87–5.11)
RDW: 17.6 % — ABNORMAL HIGH (ref 11.5–15.5)
RDW: 17.5 % — ABNORMAL HIGH (ref 11.5–15.5)
WBC: 4.7 10*3/uL (ref 4.0–10.5)
WBC: 5.4 10*3/uL (ref 4.0–10.5)
nRBC: 0 % (ref 0.0–0.2)
nRBC: 0 % (ref 0.0–0.2)

## 2020-06-21 LAB — COMPREHENSIVE METABOLIC PANEL
ALT: 15 U/L (ref 0–44)
AST: 19 U/L (ref 15–41)
Albumin: 2.3 g/dL — ABNORMAL LOW (ref 3.5–5.0)
Alkaline Phosphatase: 57 U/L (ref 38–126)
Anion gap: 13 (ref 5–15)
BUN: 35 mg/dL — ABNORMAL HIGH (ref 8–23)
CO2: 25 mmol/L (ref 22–32)
Calcium: 7.7 mg/dL — ABNORMAL LOW (ref 8.9–10.3)
Chloride: 98 mmol/L (ref 98–111)
Creatinine, Ser: 4.71 mg/dL — ABNORMAL HIGH (ref 0.44–1.00)
GFR, Estimated: 10 mL/min — ABNORMAL LOW (ref >60)
Glucose, Bld: 89 mg/dL (ref 70–99)
Potassium: 4.1 mmol/L (ref 3.5–5.1)
Sodium: 136 mmol/L (ref 135–145)
Total Bilirubin: 0.8 mg/dL (ref 0.3–1.2)
Total Protein: 5.1 g/dL — ABNORMAL LOW (ref 6.5–8.1)

## 2020-06-21 LAB — GLUCOSE, CAPILLARY
Glucose-Capillary: 145 mg/dL — ABNORMAL HIGH (ref 70–99)
Glucose-Capillary: 106 mg/dL — ABNORMAL HIGH (ref 70–99)
Glucose-Capillary: 100 mg/dL — ABNORMAL HIGH (ref 70–99)
Glucose-Capillary: 104 mg/dL — ABNORMAL HIGH (ref 70–99)

## 2020-06-21 LAB — BPAM RBC
Blood Product Expiration Date: 202201252359
Blood Product Expiration Date: 202201252359
ISSUE DATE / TIME: 202201051509
ISSUE DATE / TIME: 202201051509
Unit Type and Rh: 6200
Unit Type and Rh: 6200

## 2020-06-21 LAB — TYPE AND SCREEN
ABO/RH(D): A POS
Antibody Screen: NEGATIVE
Unit division: 0
Unit division: 0

## 2020-06-21 LAB — HEMOGLOBIN A1C
Hgb A1c MFr Bld: 4.8 % (ref 4.8–5.6)
Mean Plasma Glucose: 91.06 mg/dL

## 2020-06-21 MED ORDER — HYDROCORTISONE ACETATE 25 MG RE SUPP
25.0000 mg | Freq: Two times a day (BID) | RECTAL | Status: DC
  Administered 2020-06-21 – 2020-06-22 (×3): 25 mg via RECTAL
  Filled 2020-06-21 (×10): qty 1

## 2020-06-21 MED ORDER — ACETAMINOPHEN 500 MG PO TABS
500.0000 mg | ORAL_TABLET | Freq: Four times a day (QID) | ORAL | Status: DC | PRN
  Administered 2020-06-22: 1000 mg via ORAL
  Administered 2020-06-23: 500 mg via ORAL
  Administered 2020-06-23 – 2020-06-24 (×2): 1000 mg via ORAL
  Filled 2020-06-21: qty 1
  Filled 2020-06-21 (×3): qty 2

## 2020-06-21 MED ORDER — TECHNETIUM TC 99M-LABELED RED BLOOD CELLS IV KIT: 25.0000 | PACK | Freq: Once | INTRAVENOUS | Status: DC | PRN

## 2020-06-21 MED ORDER — CHLORHEXIDINE GLUCONATE CLOTH 2 % EX PADS
6.0000 | MEDICATED_PAD | Freq: Every day | CUTANEOUS | Status: DC
  Administered 2020-06-21 – 2020-06-25 (×4): 6 via TOPICAL

## 2020-06-21 MED ORDER — NITROGLYCERIN 0.4 MG SL SUBL: 0.4000 mg | SUBLINGUAL_TABLET | SUBLINGUAL | Status: DC | PRN

## 2020-06-21 NOTE — Progress Notes (Signed)
PROGRESS NOTE  PATCHES MCDONNELL YQM:[id number] DOB: [birth year] DOA: 06/20/2020 PCP: [name], MD  Brief History   64 year old woman PMH including ESRD, diabetes mellitus, recurrent GI bleeds, gastric and proximal duodenal AVM, presented with bright red blood per rectum including clots.  Seen last month by GI and underwent EGD at that time.  Transfused 2 units PRBC in the emergency department and admitted for further evaluation of suspected lower GI bleed and treatment of acute blood loss anemia.  A & P  GI bleed, painless hematochezia, thought lower, with associated acute blood loss anemia. PMH includes recurrent upper GI bleed secondary to AVMs, status post cauterization on several occasions.  Also known to have pandiverticulosis and hemorrhoids. --Hgb 5.5 > 8.2 w/ PRBC x2. Continues to have blood with bowel movement.  Hemodynamics are stable.  GI pursuing RBC tag study --Repeat hemoglobin and trend.  Transfuse as needed. --PPI, Cytotec (from prior admit)  COVID-19 positive.  Incidental finding.  Completely asymptomatic.  No treatment or laboratory studies indicated. 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 HgbA1c 4.8. --CBG stable, continue SSI --continue gabapentin  Coronary artery disease managed medically --continue atorvastatin,   Essential hypertension --Stable.  Continue carvedilol, clonidine, hydralazine, Imdur.  Prolonged QT.  Zoloft and trazodone held. --Repeat EKG to reassess.  Disposition Plan:  Discussion: as above  Status is: Inpatient  Remains inpatient appropriate because:IV treatments appropriate due to intensity of illness or inability to take PO and Inpatient level of care appropriate due to severity of illness   Dispo: The patient is from: Home              Anticipated d/c is to: Home              Anticipated d/c date is: 3 days               Patient currently is not medically stable to d/c.  DVT prophylaxis: heparin injection 5,000 Units Start: 06/20/20 1630   Code Status: Full Code Family Communication: none  Murray Hodgkins, MD  [facility] Direct contact: see [email or web address] (further directions at bottom of note if needed) 7PM-7AM contact night coverage as at bottom of note 06/21/2020, 4:43 PM  LOS: 0 days   Significant Hospital Events   .    Consults:  .    Procedures:  .   Significant Diagnostic Tests:  [name]    Micro Data:  .    Antimicrobials:  .   Interval History/Subjective  CC: f/u bleeding  Reports a couple episodes of bloody bowel movements today.  Confirmed by nurse.  Reports some abdominal discomfort and cramping.  Breathing fine.  No respiratory complaints.  Objective   Vitals:  Vitals:   06/21/20 0825 06/21/20 1342  BP: (!) 127/59 (!) 112/54  Pulse: 94 85  Resp: 17 16  Temp: 99.5 F (37.5 C) 99.1 F (37.3 C)  SpO2: 95% 98%    Exam:  Constitutional:   . Appears calm and comfortable ENMT:  . grossly normal hearing  Respiratory:  . CTA bilaterally, no w/r/r.  . Respiratory effort normal.  Cardiovascular:  . RRR, no m/r/g . No RLE or left stump (AKA) edema   Abdomen: soft Psychiatric:  . Mental status o Mood, affect appropriate  I have personally reviewed the following:   Today's Data  . CBG stable . CMP unremarkable . Hemoglobin posttransfusion appropriately increased to  8.2  Scheduled Meds: . acetaminophen  650 mg Oral Once  . atorvastatin  10 mg Oral QHS  . calcium acetate  1,334 mg Oral QAC lunch  . calcium acetate  2,001 mg Oral BID WC  . carvedilol  3.125 mg Oral BID WC  . Chlorhexidine Gluconate Cloth  6 each Topical Q0600  . [START ON 06/22/2020] cloNIDine  0.1 mg Oral 2 times per day on Sun Mon Wed Fri  . [START ON 06/22/2020] gabapentin  200 mg Oral 3 times per day on Sun Mon Wed Fri  . gabapentin  300 mg Oral 2 times per day on Tue Thu Sat  .  heparin  5,000 Units Subcutaneous Q8H  . [START ON 06/22/2020] hydrALAZINE  100 mg Oral 2 times per day on Sun Mon Wed Fri  . hydrocortisone  25 mg Rectal BID  . insulin aspart  0-5 Units Subcutaneous QHS  . insulin aspart  0-9 Units Subcutaneous TID WC  . isosorbide mononitrate  30 mg Oral Daily  . misoprostol  100 mcg Oral Q6H  . pantoprazole  40 mg Oral BID   Continuous Infusions: . sodium chloride    . sodium chloride      Principal Problem:   GIB (gastrointestinal bleeding) Active Problems:   End stage renal disease ([name])   Diabetic peripheral neuropathy ([name])   DM type 2 causing ESRD ([name])   Anemia, blood loss   QT prolongation   Anemia of chronic renal failure   Coronary artery disease of native artery of native heart with stable angina pectoris ([name])   Rectal bleeding   Lab test positive for detection of COVID-19 virus   LOS: 0 days   How to contact the [facility] Attending or Consulting provider 7A - 7P or covering provider during after hours [place], for this patient?  1. Check the care team in [facility] and look for a) attending/consulting TRH provider listed and b) the [facility] team listed 2. Log into [email or web address] and use [place] universal password to access. If you do not have the password, please contact the hospital operator. 3. Locate the [facility] provider you are looking for under [facility] and page to a number that you can be directly reached. 4. If you still have difficulty reaching the provider, please page the [facility] (Director on Call) for the Hospitalists listed on amion for assistance.

## 2020-06-21 NOTE — Progress Notes (Signed)
          Daily Rounding Note  06/21/2020, 8:49 AM  LOS: 0 days   SUBJECTIVE:   Chief complaint:  hematochezia   Passed ~ 5 BM's in last 14 hours.  Some L sided abd pain and leg pain, both are not new issues.  Overall feels lousy.  No N/V  OBJECTIVE:         Vital signs in last 24 hours:    Temp:  [98.1 F (36.7 C)-100.5 F (38.1 C)] 99.5 F (37.5 C) (01/06 0825) Pulse Rate:  [69-94] 94 (01/06 0825) Resp:  [12-20] 17 (01/06 0825) BP: (97-134)/(49-82) 127/59 (01/06 0825) SpO2:  [95 %-100 %] 95 % (01/06 0825) Last BM Date: 06/20/20 There were no vitals filed for this visit. General: chronically ill looking.  comfortable   Heart: RRR w soft murmer Chest: clear bil but diminished BS.  No cough or SOB Abdomen: soft, ND.  Active BS.  Mild L sided tenderness Rectal: soft dark brown/black stool and burgundy blood in BSC.    Extremities: no CCE.  L AKA Neuro/Psych:  Oriented x 3.  Alert.   Intake/Output from previous day: 01/05 0701 - 01/06 0700 In: 1012.5 [P.O.:240; Blood:772.5] Out: 2 [Urine:1; Emesis/NG output:1]  Intake/Output this shift: No intake/output data recorded.  Lab Results: Recent Labs    06/20/20 1225 06/21/20 0239  WBC 4.0 4.7  HGB 5.5* 8.2*  HCT 17.7* 25.8*  PLT 191 174   BMET Recent Labs    06/20/20 1225 06/21/20 0239  NA 135 136  K 3.6 4.1  CL 95* 98  CO2 28 25  GLUCOSE 112* 89  BUN 26* 35*  CREATININE 3.92* 4.71*  CALCIUM 7.6* 7.7*   LFT Recent Labs    06/20/20 1225 06/21/20 0239  PROT 4.8* 5.1*  ALBUMIN 2.3* 2.3*  AST 16 19  ALT 16 15  ALKPHOS 57 57  BILITOT 0.8 0.8   PT/INR No results for input(s): LABPROT, INR in the last 72 hours. Hepatitis Panel No results for input(s): HEPBSAG, HCVAB, HEPAIGM, HEPBIGM in the last 72 hours.  Studies/Results: No results found.  ASSESMENT:   *   Painless hematochezia, blood w BMs.   Known colon tics and hemorrhoids Previous ablation of  UGI AVMs.    *   Recurrent acute anemia.  Hgb 5.5 >> 2 PRBCs 8.2.    *   ESRD   PLAN   *   Added Anusol HC PR bid, had neglected to order these yesterday.    *   ? nuc med tagged RBC scan?     Brandy Houston  06/21/2020, 8:49 AM Phone [phone]

## 2020-06-21 NOTE — Consult Note (Signed)
Brandy Houston Admit Date: 06/20/2020 06/21/2020 [name] Requesting Physician:  Doristine Bosworth MD  Reason for Consult:  ESRD comanagement HPI:  39F ESRD THS admitted with hematochezia and ABLA.  PMH includes DM2, PVD s/p L AKA, CHF, hx/o GIB.  Is COVID19+ but no sig SOB/Cough stable on RA.    Hb 5.5 at presentation, transfused and up to 8.2 this AM.  GI is following. BPs and HR stable  This AM K is 4.1, HCO3 25.    Last HD 1/4, achieved EDW. Last Hb 12/30 was 10.3.  Outpt HD Orders Unit: [place] Days: THS Time: 4h Dialyzer: F180 EDW: 55.5kg K/Ca: 2/2 Access: AVF RUE Needle Size: 15 BFR/DFR: 450/800 UF Proflie: 4 VDRA: C3 1.75 qTx EPO: Mircera 225 q2wk last 12/23 IV Fe: Rec Fe at this time Heparin: none at HD  Balance of 12 systems is negative w/ exceptions as above  PMH  Past Medical History:  Diagnosis Date  . Allergy   . Anemia   . Anxiety   . Arthritis    "in my joints" (03/10/2017)  . Chronic diastolic CHF (congestive heart failure) ([place])   . CKD (chronic kidney disease), stage IV ([place])    stage IV. previous HD, none currently 10/30/15 (confirmed 02/05/2017 & 03/10/2017)  . Depression    Chronic  . Diabetic peripheral neuropathy ([place])   . Dysrhythmia    tachycardia, normal ECHO 08-09-14  . GERD (gastroesophageal reflux disease)   . Heart murmur     dx'd 02/05/2017  . Hepatitis C    "tx'd in 2016; I'm negative now" (02/05/2017)  . History of blood transfusion 2017   "w/knee replacement"  . Hyperlipidemia   . Hypertension   . Osteoarthritis of left knee   . Peripheral neuropathy   . Protein calorie malnutrition ([place])   . Septic arthritis ([place])   . Slow transit constipation   . Symptomatic anemia 01/27/2020  . Type II diabetes mellitus ([name])    IDDM  . Uncontrolled hypertension 02/18/2013  . Unsteady gait    PSH  Past Surgical History:  Procedure Laterality Date  . A/V FISTULAGRAM Right 07/07/2018   Procedure: A/V FISTULAGRAM;  Surgeon: Marty Heck,  MD;  Location: [place] [facility];  Service: Cardiovascular;  Laterality: Right;  . AMPUTATION Left 03/20/2017   Procedure: LEFT ABOVE KNEE AMPUTATION;  Surgeon: Newt Minion, MD;  Location: [place];  Service: Orthopedics;  Laterality: Left;  . APPENDECTOMY    . AV FISTULA PLACEMENT Left 09/13/2014   Procedure: Brachial Artery to Brachial Vein Gortex Four - Seven Stretch GRAFT INSERTION Left Forearm;  Surgeon: Mal Misty, MD;  Location: [place];  Service: Vascular;  Laterality: Left;  . BASCILIC VEIN TRANSPOSITION Right 10/26/2017   Procedure: BASILIC VEIN TRANSPOSITION FIRST STAGE RIGHT ARM;  Surgeon: Conrad Vieques, MD;  Location: [place];  Service: Vascular;  Laterality: Right;  . BASCILIC VEIN TRANSPOSITION Right 02/01/2018   Procedure: SECOND STAGE BASILIC VEIN TRANSPOSITION RIGHT UPPER EXTREMITY;  Surgeon: Marty Heck, MD;  Location: [place];  Service: Vascular;  Laterality: Right;  . CHOLECYSTECTOMY OPEN    . COLON SURGERY    . COLONOSCOPY    . ENTEROSCOPY N/A 05/28/2020   Procedure: ENTEROSCOPY;  Surgeon: Mauri Pole, MD;  Location: [facility];  Service: Endoscopy;  Laterality: N/A;  . ESOPHAGOGASTRODUODENOSCOPY (EGD) WITH PROPOFOL N/A 05/14/2020   Procedure: ESOPHAGOGASTRODUODENOSCOPY (EGD) WITH PROPOFOL;  Surgeon: Mauri Pole, MD;  Location: [facility];  Service:  Endoscopy;  Laterality: N/A;  with APC  . EXCISIONAL TOTAL KNEE ARTHROPLASTY WITH ANTIBIOTIC SPACERS Left 02/06/2017   Procedure: Incisional total iknee with antibiotic spacer ;  Surgeon: Leandrew Koyanagi, MD;  Location: [place];  Service: Orthopedics;  Laterality: Left;  . HOT HEMOSTASIS N/A 05/14/2020   Procedure: HOT HEMOSTASIS (ARGON PLASMA COAGULATION/BICAP);  Surgeon: Mauri Pole, MD;  Location: [facility];  Service: Endoscopy;  Laterality: N/A;  . HOT HEMOSTASIS N/A 05/28/2020   Procedure: HOT HEMOSTASIS (ARGON PLASMA COAGULATION/BICAP);  Surgeon: Mauri Pole, MD;  Location: [facility]  ENDOSCOPY;  Service: Endoscopy;  Laterality: N/A;  . INTRAVASCULAR PRESSURE WIRE/FFR STUDY N/A 04/13/2020   Procedure: INTRAVASCULAR PRESSURE WIRE/FFR STUDY;  Surgeon: Burnell Blanks, MD;  Location: [place] [facility];  Service: Cardiovascular;  Laterality: N/A;  . IR FLUORO GUIDE CV LINE RIGHT  02/10/2017  . IR REMOVAL TUN CV CATH W/O FL  04/01/2017  . IR US GUIDE VASC ACCESS RIGHT  02/10/2017  . IRRIGATION AND DEBRIDEMENT KNEE Left 03/12/2017   Procedure: IRRIGATION AND DEBRIDEMENT LEFT KNEE WITH WOUND VAC APPLICATION;  Surgeon: Leandrew Koyanagi, MD;  Location: [place];  Service: Orthopedics;  Laterality: Left;  . JOINT REPLACEMENT    . KNEE ARTHROSCOPY Right 08/10/2014   Procedure: ARTHROSCOPY I & D KNEE;  Surgeon: Marianna Payment, MD;  [facility]: [facility];  Service: Orthopedics;  Laterality: Right;  . KNEE ARTHROSCOPY Left 08/11/2014   Procedure: ARTHROSCOPIC WASHOUT LEFT KNEE;  Surgeon: Marianna Payment, MD;  Location: [place];  Service: Orthopedics;  Laterality: Left;  . KNEE ARTHROSCOPY Left 08/19/2014   Procedure: ARTHROSCOPIC WASHOUT LEFT KNEE;  Surgeon: Leandrew Koyanagi, MD;  Location: [place];  Service: Orthopedics;  Laterality: Left;  . KNEE ARTHROSCOPY WITH LATERAL MENISECTOMY Left 04/04/2015   Procedure: AND PARTIAL LATERAL MENISECTOMY;  Surgeon: Leandrew Koyanagi, MD;  Location: [place];  Service: Orthopedics;  Laterality: Left;  . KNEE ARTHROSCOPY WITH MEDIAL MENISECTOMY Left 04/04/2015   Procedure: LEFT KNEE ARTHROSCOPY WITH PARTIAL MEDIAL MENISCECTOMY  AND SYNOVECTOMY;  Surgeon: Leandrew Koyanagi, MD;  Location: [place];  Service: Orthopedics;  Laterality: Left;  . PERIPHERAL VASCULAR BALLOON ANGIOPLASTY  07/07/2018   Procedure: PERIPHERAL VASCULAR BALLOON ANGIOPLASTY;  Surgeon: Marty Heck, MD;  Location: [place] [facility];  Service: Cardiovascular;;  right AV fistula  . POLYPECTOMY    . RIGHT/LEFT HEART CATH AND CORONARY ANGIOGRAPHY N/A 04/13/2020    Procedure: RIGHT/LEFT HEART CATH AND CORONARY ANGIOGRAPHY;  Surgeon: Burnell Blanks, MD;  Location: [place] [facility];  Service: Cardiovascular;  Laterality: N/A;  . SHOULDER ARTHROSCOPY Bilateral 08/10/2014   Procedure: I & D BILATERAL SHOULDERS ;  Surgeon: Marianna Payment, MD;  [facility];  Service: Orthopedics;  Laterality: Bilateral;  . SMALL INTESTINE SURGERY     Due to Small Bowel Obstruction; "fixed it when they did my gallbladder OR"  . TEE WITHOUT CARDIOVERSION N/A 08/14/2014   Procedure: TRANSESOPHAGEAL ECHOCARDIOGRAM (TEE);  Surgeon: Thayer Headings, MD;  Location: [place];  Service: Cardiovascular;  Laterality: N/A;  . TENOSYNOVECTOMY Right 08/11/2014   Procedure: RIGHT WRIST IRRIGATION AND DEBRIDEMENT, TENOSYNOVECTOMY;  Surgeon: Marianna Payment, MD;  Location: [place];  Service: Orthopedics;  Laterality: Right;  . TOTAL KNEE ARTHROPLASTY Left 11/07/2015   Procedure: LEFT TOTAL KNEE ARTHROPLASTY WITH REVISION OF IMPLANTS;  Surgeon: Leandrew Koyanagi, MD;  Location: [place];  Service: Orthopedics;  Laterality: Left;  . TUBAL LIGATION  FH  Family History  Problem Relation Age of Onset  . Uterine cancer Mother   . Heart disease Father   . Hypertension Sister   . Uterine cancer Sister   . Cancer Brother        unsure type  . Diabetes Brother   . Hypertension Brother   . Colon cancer Neg Hx   . Colon polyps Neg Hx   . Esophageal cancer Neg Hx   . Rectal cancer Neg Hx   . Stomach cancer Neg Hx    SH  reports that she has been smoking cigarettes. She has a 17.50 pack-year smoking history. She has never used smokeless tobacco. She reports previous alcohol use. She reports that she does not use drugs. Allergies  Allergies  Allergen Reactions  . Compazine [Prochlorperazine] Shortness Of Breath, Swelling and Other (See Comments)    TONGUE SWELLS  . Shellfish-Derived Products Anaphylaxis  . Iodinated Diagnostic Agents Hives and Rash  . Omnipaque [Iohexol]  Hives  . Tape     Brown paper tape caused skin irritation  . Sulfa Antibiotics Rash   Home medications Prior to Admission medications   Medication Sig Start Date End Date Taking? Authorizing Provider  atorvastatin (LIPITOR) 10 MG tablet Take 1 tablet (10 mg total) by mouth at bedtime. 06/13/20  Yes [name], MD  calcium acetate (PHOSLO) 667 MG capsule Take 1,334-2,001 mg by mouth See admin instructions. Take 3 capsules (2001 mg) by mouth with breakfast, take 2 capsules (1334 mg) by mouth with lunch, take 3 capsules (2001 mg) by mouth with supper & take 2 capsules (1334 mg) by mouth with snacks. 01/13/19  Yes [provider]  carvedilol (COREG) 3.125 MG tablet Take 1 tablet (3.125 mg total) by mouth 2 (two) times daily. 04/04/20 07/03/20 Yes Donato Heinz, MD  cloNIDine (CATAPRES) 0.1 MG tablet Take 0.1 mg by mouth See admin instructions. Bid on non-dialysis days( m,w,f,sun) 03/29/20  Yes [provider]  gabapentin (NEURONTIN) 100 MG capsule Take 200 mg by mouth See admin instructions. Take 2 capsules (200 mg) by mouth 3 times on Sundays, Mondays, Wednesdays, & Fridays. 11/01/18  Yes [provider]  gabapentin (NEURONTIN) 300 MG capsule Take 300 mg by mouth See admin instructions. Take 1 capsule (300 mg) by mouth twice daily on Tuesdays, Thursdays, & Saturdays.   Yes [provider]  hydrALAZINE (APRESOLINE) 100 MG tablet Take 100 mg by mouth See admin instructions. Bid on non-dialysis days.( m,w,f,sun)   Yes [provider]  isosorbide mononitrate (IMDUR) 30 MG 24 hr tablet Take 1 tablet (30 mg total) by mouth daily. 04/16/20 07/15/20 Yes Cheryln Manly, NP  lidocaine-prilocaine (EMLA) cream Apply 1 application topically Every Tuesday,Thursday,and Saturday with dialysis. On days of dialysis 3x week 10/25/18  Yes [provider]  misoprostol (CYTOTEC) 100 MCG tablet Take 1 tablet (100 mcg total) by mouth every 6 (six) hours.  05/29/20 06/28/20 Yes Pahwani, Rinka R, MD  multivitamin (RENA-VIT) TABS tablet Take 1 tablet by mouth daily. 03/01/19  Yes [provider]  nitroGLYCERIN (NITROSTAT) 0.4 MG SL tablet Place 1 tablet (0.4 mg total) under the tongue every 5 (five) minutes as needed. 04/25/20 07/24/20 Yes Donato Heinz, MD  pantoprazole (PROTONIX) 40 MG tablet Take 1 tablet (40 mg total) by mouth 2 (two) times daily. 40 mg BID for 2 months & then once daily 05/29/20 07/28/20 Yes Pahwani, Rinka R, MD  sertraline (ZOLOFT) 100 MG tablet Take 2 tablets (200 mg  total) by mouth daily. 06/13/20  Yes [name], MD  traZODone (DESYREL) 50 MG tablet Take 50 mg by mouth at bedtime. 10/27/18  Yes [provider]  Accu-Chek Softclix Lancets lancets Check sugars three times a day for E11.65 09/20/18   [name], MD  acetaminophen (TYLENOL) 500 MG tablet Take 500-1,000 mg by mouth every 6 (six) hours as needed (for pain.).    [provider]  blood glucose meter kit and supplies KIT Dispense based on patient and insurance preference. Use up to four times daily as directed 10/13/19   [name], MD  Blood Glucose Monitoring Suppl (ACCU-CHEK GUIDE) w/Device KIT 1 Device by Does not apply route daily. 10/31/19   [name], MD  diphenhydrAMINE (BENADRYL) 25 mg capsule Take 25 mg by mouth every 6 (six) hours as needed for allergies.    [provider]  glucose blood (ACCU-CHEK GUIDE) test strip Check blood sugar up to 4 times a day 10/20/19   [name], MD  Lancet Devices [facility]) lancets Check sugars TID for E11.65 01/29/15   Lance Bosch, NP  methocarbamol (ROBAXIN) 500 MG tablet Take 500 mg by mouth every 8 (eight) hours as needed for muscle spasms.  06/08/19   [provider]  Methoxy PEG-Epoetin Beta (MIRCERA IJ) Get it at dialysis 06/30/19 06/28/20  [provider]  polyethylene glycol (MIRALAX / GLYCOLAX) 17 g packet Take 17 g by mouth daily  as needed for moderate constipation. 12/10/18   [name], MD    Current Medications Scheduled Meds: . acetaminophen  650 mg Oral Once  . atorvastatin  10 mg Oral QHS  . calcium acetate  1,334 mg Oral QAC lunch  . calcium acetate  2,001 mg Oral BID WC  . carvedilol  3.125 mg Oral BID WC  . Chlorhexidine Gluconate Cloth  6 each Topical Q0600  . [START ON 06/22/2020] cloNIDine  0.1 mg Oral 2 times per day on Sun Mon Wed Fri  . [START ON 06/22/2020] gabapentin  200 mg Oral 3 times per day on Sun Mon Wed Fri  . gabapentin  300 mg Oral 2 times per day on Tue Thu Sat  . heparin  5,000 Units Subcutaneous Q8H  . [START ON 06/22/2020] hydrALAZINE  100 mg Oral 2 times per day on Sun Mon Wed Fri  . hydrocortisone  25 mg Rectal BID  . insulin aspart  0-5 Units Subcutaneous QHS  . insulin aspart  0-9 Units Subcutaneous TID WC  . isosorbide mononitrate  30 mg Oral Daily  . misoprostol  100 mcg Oral Q6H  . pantoprazole  40 mg Oral BID   Continuous Infusions: . sodium chloride    . sodium chloride     PRN Meds:.sodium chloride, sodium chloride, acetaminophen, alteplase, calcium acetate, heparin, lidocaine (PF), lidocaine-prilocaine, methocarbamol, pentafluoroprop-tetrafluoroeth  CBC Recent Labs  Lab 06/20/20 1225 06/21/20 0239  WBC 4.0 4.7  HGB 5.5* 8.2*  HCT 17.7* 25.8*  MCV 96.2 93.1  PLT 191 865   Basic Metabolic Panel Recent Labs  Lab 06/20/20 1225 06/21/20 0239  NA 135 136  K 3.6 4.1  CL 95* 98  CO2 28 25  GLUCOSE 112* 89  BUN 26* 35*  CREATININE 3.92* 4.71*  CALCIUM 7.6* 7.7*    Physical Exam  Blood pressure (!) 127/59, pulse 94, temperature 99.5 F (37.5 C), temperature source Oral, resp. rate 17, last menstrual period 03/16/2002, SpO2 95 %. GEN: NAD ENT: NCAT  EYES: EOMI CV: RRR PULM: CTAB ABD: s/nt/nd SKIN: No rashes/lesiosn EXT:No LE RUE AVF +B/T   Assessment 23F ESRD THS present with LGIB/ABLA, ASx COVID19+  1. ESRD THS: on schedule, HD today, no  heparin 2. LGIB, GI following, ongoing 3. ABLA, Needs Aranesp today, transfuse per TRH/GI.   4. CKD-BMD: stable 5. HTN/VOl: Outpt EDW seems ok 6. CAD  Plan 1. HD today, will follow along   [name]  06/21/2020, 12:15 PM

## 2020-06-21 NOTE — Progress Notes (Signed)
Pt stable  transferred  to 5N report given to [facility]

## 2020-06-21 NOTE — Plan of Care (Signed)
  Problem: Health Behavior/Discharge Planning: Goal: Ability to manage health-related needs will improve Outcome: Progressing   Problem: Clinical Measurements: Goal: Ability to maintain clinical measurements within normal limits will improve Outcome: Progressing   Problem: Activity: Goal: Risk for activity intolerance will decrease Outcome: Progressing   Problem: Nutrition: Goal: Adequate nutrition will be maintained Outcome: Progressing   Problem: Coping: Goal: Level of anxiety will decrease Outcome: Progressing   Problem: Elimination: Goal: Will not experience complications related to bowel motility Outcome: Progressing   Problem: Safety: Goal: Ability to remain free from injury will improve Outcome: Progressing   Problem: Skin Integrity: Goal: Risk for impaired skin integrity will decrease Outcome: Progressing   

## 2020-06-21 NOTE — Progress Notes (Signed)
Received Pt in bed from nuclear med. VS taken and recorded, Pt not in distress, complaining of lower back pain from bed positioning. Pt ambulated to bathroom, due meds given.

## 2020-06-21 NOTE — Hospital Course (Addendum)
65 year old woman PMH including ESRD, diabetes mellitus, recurrent GI bleeds, gastric and proximal duodenal AVM, presented with bright red blood per rectum including clots.  Seen last month by GI and underwent EGD at that time.  Transfused 2 units PRBC in the emergency department and admitted for further evaluation of suspected lower GI bleed and treatment of acute blood loss anemia.  Seen by gastroenterology, nuclear medicine scan pursued which was unrevealing.  GI plans endoscopy 1/10.  A & P  GI bleed, painless hematochezia, thought lower, with associated acute blood loss anemia. PMH includes recurrent upper GI bleed secondary to AVMs, status post cauterization on several occasions.  Also known to have pandiverticulosis and hemorrhoids.  RBC nuclear study was unrevealing. --Hemoglobin remained stable after transfusion.  Remains stable status post 2 units PRBC but continues to have some bleeding.   --Continue PPI, Cytotec (from prior admit) --Endoscopy in a.m.  COVID-19 positive without respiratory compromise.  Incidental finding.  Asymptomatic.  No treatment or laboratory studies indicated. --Asymptomatic.  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 HgbA1c 4.8. --CBG remains stable, continue SSI --Continue gabapentin  Coronary artery disease managed medically --continue atorvastatin  Essential hypertension --Remains stable.  Continue carvedilol, clonidine, hydralazine, Imdur.  Prolonged QT.  Zoloft and trazodone held. --Resolved on EKG 1/7 independently reviewed

## 2020-06-22 DIAGNOSIS — K922 Gastrointestinal hemorrhage, unspecified: Secondary | ICD-10-CM | POA: Diagnosis not present

## 2020-06-22 DIAGNOSIS — D500 Iron deficiency anemia secondary to blood loss (chronic): Secondary | ICD-10-CM | POA: Diagnosis not present

## 2020-06-22 DIAGNOSIS — D649 Anemia, unspecified: Secondary | ICD-10-CM | POA: Diagnosis not present

## 2020-06-22 DIAGNOSIS — E1122 Type 2 diabetes mellitus with diabetic chronic kidney disease: Secondary | ICD-10-CM | POA: Diagnosis not present

## 2020-06-22 LAB — RENAL FUNCTION PANEL
Albumin: 2.2 g/dL — ABNORMAL LOW (ref 3.5–5.0)
Anion gap: 15 (ref 5–15)
BUN: 48 mg/dL — ABNORMAL HIGH (ref 8–23)
CO2: 23 mmol/L (ref 22–32)
Calcium: 8.1 mg/dL — ABNORMAL LOW (ref 8.9–10.3)
Chloride: 98 mmol/L (ref 98–111)
Creatinine, Ser: 5.8 mg/dL — ABNORMAL HIGH (ref 0.44–1.00)
GFR, Estimated: 8 mL/min — ABNORMAL LOW (ref >60)
Glucose, Bld: 106 mg/dL — ABNORMAL HIGH (ref 70–99)
Phosphorus: 4.5 mg/dL (ref 2.5–4.6)
Potassium: 3.8 mmol/L (ref 3.5–5.1)
Sodium: 136 mmol/L (ref 135–145)

## 2020-06-22 LAB — CBC
HCT: 25.2 % — ABNORMAL LOW (ref 36.0–46.0)
Hemoglobin: 8.3 g/dL — ABNORMAL LOW (ref 12.0–15.0)
MCH: 30.1 pg (ref 26.0–34.0)
MCHC: 32.9 g/dL (ref 30.0–36.0)
MCV: 91.3 fL (ref 80.0–100.0)
Platelets: 197 10*3/uL (ref 150–400)
RBC: 2.76 MIL/uL — ABNORMAL LOW (ref 3.87–5.11)
RDW: 17.6 % — ABNORMAL HIGH (ref 11.5–15.5)
WBC: 5.2 10*3/uL (ref 4.0–10.5)
nRBC: 0 % (ref 0.0–0.2)

## 2020-06-22 LAB — GLUCOSE, CAPILLARY
Glucose-Capillary: 102 mg/dL — ABNORMAL HIGH (ref 70–99)
Glucose-Capillary: 129 mg/dL — ABNORMAL HIGH (ref 70–99)
Glucose-Capillary: 160 mg/dL — ABNORMAL HIGH (ref 70–99)
Glucose-Capillary: 90 mg/dL (ref 70–99)

## 2020-06-22 MED ORDER — LIDOCAINE HCL (PF) 1 % IJ SOLN: 5.0000 mL | INTRAMUSCULAR | Status: DC | PRN

## 2020-06-22 MED ORDER — SODIUM CHLORIDE 0.9 % IV SOLN: 100.0000 mL | INTRAVENOUS | Status: DC | PRN

## 2020-06-22 MED ORDER — LIDOCAINE-PRILOCAINE 2.5-2.5 % EX CREA: 1.0000 "application " | TOPICAL_CREAM | CUTANEOUS | Status: DC | PRN

## 2020-06-22 MED ORDER — PENTAFLUOROPROP-TETRAFLUOROETH EX AERO: 1.0000 "application " | INHALATION_SPRAY | CUTANEOUS | Status: DC | PRN

## 2020-06-22 NOTE — Progress Notes (Signed)
Daily Rounding Note  06/22/2020, 10:38 AM  LOS: 1 day   SUBJECTIVE:   Chief complaint: Blood in stool.     Patient has the same nurse today as took care of her yesterday.  RN tells me patient had multiple soft dark brown stools admixed with blood yesterday. No bowel movements yet this morning.   Continues to have some mild left abdominal pain which is chronic. Eating well, good appetite.  No nausea or vomiting Currently receiving hemodialysis in isolation room.  OBJECTIVE:         Vital signs in last 24 hours:    Temp:  [98.8 F (37.1 C)-100.6 F (38.1 C)] 99.1 F (37.3 C) (01/07 0840) Pulse Rate:  [85-107] 100 (01/07 1020) Resp:  [15-18] 18 (01/07 0900) BP: (112-162)/(54-79) 162/78 (01/07 1020) SpO2:  [87 %-98 %] 96 % (01/07 0840) Weight:  [54.3 kg] 54.3 kg (01/07 0840) Last BM Date: 06/21/20 Filed Weights   06/22/20 0840  Weight: 54.3 kg   Not reexamined  Intake/Output from previous day: 01/06 0701 - 01/07 0700 In: 720 [P.O.:720] Out: -   Intake/Output this shift: No intake/output data recorded.  Lab Results: Recent Labs    06/21/20 0239 06/21/20 1825 06/22/20 0328  WBC 4.7 5.4 5.2  HGB 8.2* 8.8* 8.3*  HCT 25.8* 27.3* 25.2*  PLT 174 207 197   BMET Recent Labs    06/20/20 1225 06/21/20 0239 06/22/20 0328  NA 135 136 136  K 3.6 4.1 3.8  CL 95* 98 98  CO2 28 25 23   GLUCOSE 112* 89 106*  BUN 26* 35* 48*  CREATININE 3.92* 4.71* 5.80*  CALCIUM 7.6* 7.7* 8.1*   LFT Recent Labs    06/20/20 1225 06/21/20 0239 06/22/20 0328  PROT 4.8* 5.1*  --   ALBUMIN 2.3* 2.3* 2.2*  AST 16 19  --   ALT 16 15  --   ALKPHOS 57 57  --   BILITOT 0.8 0.8  --    PT/INR No results for input(s): LABPROT, INR in the last 72 hours. Hepatitis Panel No results for input(s): HEPBSAG, HCVAB, HEPAIGM, HEPBIGM in the last 72 hours.  Studies/Results: NM GI Blood Loss  Result Date: 06/21/2020 CLINICAL DATA:   65 year old female with GI bleed. EXAM: NUCLEAR MEDICINE GASTROINTESTINAL BLEEDING SCAN TECHNIQUE: Sequential abdominal images were obtained following intravenous administration of Tc-64m labeled red blood cells. RADIOPHARMACEUTICALS:  24.6 mCi Tc-62m pertechnetate in-vitro labeled red cells. COMPARISON:  CT abdomen pelvis dated 01/27/2020. FINDINGS: Physiologic uptake in the liver, spleen, and cardiac blood pool. Activity is also noted within the abdominal aorta and iliac vessels. Excreted radiotracer noted within the urinary bladder. No abnormal activity identified conforming to the bowel to suggest active bleed. IMPRESSION: No scintigraphic evidence of active GI bleed. Electronically Signed   By: Anner Crete M.D.   On: 06/21/2020 17:20   Scheduled Meds: . acetaminophen  650 mg Oral Once  . atorvastatin  10 mg Oral QHS  . calcium acetate  1,334 mg Oral QAC lunch  . calcium acetate  2,001 mg Oral BID WC  . carvedilol  3.125 mg Oral BID WC  . Chlorhexidine Gluconate Cloth  6 each Topical Q0600  . cloNIDine  0.1 mg Oral 2 times per day on Sun Mon Wed Fri  . gabapentin  200 mg Oral 3 times per day on Sun Mon Wed Fri  . gabapentin  300 mg Oral 2 times per day on Tue Thu  Sat  . heparin  5,000 Units Subcutaneous Q8H  . hydrALAZINE  100 mg Oral 2 times per day on Sun Mon Wed Fri  . hydrocortisone  25 mg Rectal BID  . insulin aspart  0-5 Units Subcutaneous QHS  . insulin aspart  0-9 Units Subcutaneous TID WC  . isosorbide mononitrate  30 mg Oral Daily  . misoprostol  100 mcg Oral Q6H  . pantoprazole  40 mg Oral BID   Continuous Infusions: . sodium chloride    . sodium chloride    . sodium chloride    . sodium chloride     PRN Meds:.sodium chloride, sodium chloride, sodium chloride, sodium chloride, acetaminophen, alteplase, calcium acetate, heparin, lidocaine (PF), lidocaine (PF), lidocaine-prilocaine, lidocaine-prilocaine, methocarbamol, nitroGLYCERIN, pentafluoroprop-tetrafluoroeth,  pentafluoroprop-tetrafluoroeth, technetium labeled red blood cells   ASSESMENT:   *    Hematochezia/bloody stool, recurrent GI bleed in patient with hx of bleeding upper intestinal AVMs/ablation, int rrhoids and colondiverticulosis. 06/21/2020 nuclear medicine bleeding scan negative for active bleed.  *     Blood loss anemia.  Low iron, low TIBC.  Ferritin 896.  B12 and folate at or above normal. Hgb 5.5 >> 2 PRBCs >> 8.3.     *    ESRD.  On hemodialysis.  Off usual TTS dialysis schedule, undergoing dialysis today.  *   Covid 19 +.  Moderna vaccination February/March 2021.  Pfizer booster 02/2020.  No resp compromise, no CXR.     PLAN   *    Per Dr. Hilarie Fredrickson.    Brandy Houston  06/22/2020, 10:38 AM Phone [phone]

## 2020-06-22 NOTE — Telephone Encounter (Signed)
Current admission 

## 2020-06-22 NOTE — Plan of Care (Signed)

## 2020-06-22 NOTE — Progress Notes (Signed)
PROGRESS NOTE  Brandy Houston OJJ:[id number] DOB: Oct 26, 1955 DOA: 06/20/2020 PCP: [name], MD  Brief History   65 year old woman PMH including ESRD, diabetes mellitus, recurrent GI bleeds, gastric and proximal duodenal AVM, presented with bright red blood per rectum including clots.  Seen last month by GI and underwent EGD at that time.  Transfused 2 units PRBC in the emergency department and admitted for further evaluation of suspected lower GI bleed and treatment of acute blood loss anemia.  Seen by gastroenterology, nuclear medicine scan pursued which was unrevealing.  A & P  GI bleed, painless hematochezia, thought lower, with associated acute blood loss anemia. PMH includes recurrent upper GI bleed secondary to AVMs, status post cauterization on several occasions.  Also known to have pandiverticulosis and hemorrhoids. --Hemoglobin stable status post 2 units PRBC, no further bleeding noted.  RBC nuclear medicine study was unrevealing.   --CBC in a.m.  If recurrent bleeding, GI will consider endoscopy. --Continue PPI, Cytotec (from prior admit)  COVID-19 positive without respiratory compromise.  Incidental finding.  Completely asymptomatic.  No treatment or laboratory studies indicated. 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 HgbA1c 4.8. --CBG remains stable, continue SSI --Will continue gabapentin  Coronary artery disease managed medically --continue atorvastatin  Essential hypertension --Remains stable.  Continue carvedilol, clonidine, hydralazine, Imdur.  Prolonged QT.  Zoloft and trazodone held. --Resolved on EKG 1/7 independently reviewed  Disposition Plan:  Discussion: Appears to be stabilizing.  CBC in a.m., if stable, likely home, if rebleeds, GI may pursue endoscopy.  Status is: Inpatient  Remains inpatient appropriate  because:Inpatient level of care appropriate due to severity of illness   Dispo: The patient is from: Home              Anticipated d/c is to: Home              Anticipated d/c date is: 1 day              Patient currently is not medically stable to d/c.  DVT prophylaxis: heparin injection 5,000 Units Start: 06/20/20 1630   Code Status: Full Code Family Communication: none  Murray Hodgkins, MD  [facility] Direct contact: see [email or web address] (further directions at bottom of note if needed) 7PM-7AM contact night coverage as at bottom of note 06/22/2020, 6:10 PM  LOS: 1 day   Significant Hospital Events   .    Consults:  . GI   Procedures:  .   Significant Diagnostic Tests:  [name]    Micro Data:  .    Antimicrobials:  .   Interval History/Subjective  CC: f/u bleeding  No bleeding per RN Pt feels ok, no chest pain Still has some abd cramping  Objective   Vitals:  Vitals:   06/22/20 1243 06/22/20 1522  BP: (!) 150/68 (!) 115/56  Pulse: (!) 109 98  Resp: 17 15  Temp: 98.7 F (37.1 C)   SpO2: 93% 97%    Exam: Constitutional:   . Appears calm and comfortable ENMT:  . grossly normal hearing  Respiratory:  . CTA bilaterally, no w/r/r.  . Respiratory effort normal.  Cardiovascular:  . RRR, no m/r/g . No RLE extremity edema   Psychiatric:  . Mental status o Mood, affect appropriate  I have personally reviewed the following:   Today's Data  . CBG stable . BMP noted . Hgb stable at 8.3  s/p transfusion  Scheduled Meds: . acetaminophen  650 mg Oral Once  . atorvastatin  10 mg Oral QHS  . calcium acetate  1,334 mg Oral QAC lunch  . calcium acetate  2,001 mg Oral BID WC  . carvedilol  3.125 mg Oral BID WC  . Chlorhexidine Gluconate Cloth  6 each Topical Q0600  . cloNIDine  0.1 mg Oral 2 times per day on Sun Mon Wed Fri  . gabapentin  200 mg Oral 3 times per day on Sun Mon Wed Fri  . gabapentin  300 mg Oral 2 times per day on Tue Thu Sat  . heparin   5,000 Units Subcutaneous Q8H  . hydrALAZINE  100 mg Oral 2 times per day on Sun Mon Wed Fri  . hydrocortisone  25 mg Rectal BID  . insulin aspart  0-5 Units Subcutaneous QHS  . insulin aspart  0-9 Units Subcutaneous TID WC  . isosorbide mononitrate  30 mg Oral Daily  . misoprostol  100 mcg Oral Q6H  . pantoprazole  40 mg Oral BID   Continuous Infusions:   Principal Problem:   GIB (gastrointestinal bleeding) Active Problems:   End stage renal disease ([name])   Diabetic peripheral neuropathy ([name])   DM type 2 causing ESRD ([name])   Anemia, blood loss   Anemia of chronic renal failure   Coronary artery disease of native artery of native heart with stable angina pectoris ([name])   Rectal bleeding   Lab test positive for detection of COVID-19 virus   LOS: 1 day   How to contact the [facility] Attending or Consulting provider 7A - 7P or covering provider during after hours [place], for this patient?  1. Check the care team in [facility] and look for a) attending/consulting TR[facility] provider listed and b) the [facility] team listed 2. Log into [email or web address] and use 's universal password to access. If you do not have the password, please contact the hospital operator. 3. Locate the [facility] provider you are looking for under [facility] and page to a number that you can be directly reached. 4. If you still have difficulty reaching the provider, please page the [facility] (Director on Call) for the Hospitalists listed on amion for assistance.

## 2020-06-22 NOTE — Procedures (Signed)
I was present at this dialysis session. I have reviewed the session itself and made appropriate changes.   Hb stable in 8s. GI following.  Added K bath. Uf goal 2L.  Off schedule.  Likely to req brief HD tomorrow to get back on schedule.    Filed Weights   06/22/20 0840  Weight: 54.3 kg    Recent Labs  Lab 06/22/20 0328  NA 136  K 3.8  CL 98  CO2 23  GLUCOSE 106*  BUN 48*  CREATININE 5.80*  CALCIUM 8.1*  PHOS 4.5    Recent Labs  Lab 06/21/20 0239 06/21/20 1825 06/22/20 0328  WBC 4.7 5.4 5.2  HGB 8.2* 8.8* 8.3*  HCT 25.8* 27.3* 25.2*  MCV 93.1 91.3 91.3  PLT 174 207 197    Scheduled Meds: . acetaminophen  650 mg Oral Once  . atorvastatin  10 mg Oral QHS  . calcium acetate  1,334 mg Oral QAC lunch  . calcium acetate  2,001 mg Oral BID WC  . carvedilol  3.125 mg Oral BID WC  . Chlorhexidine Gluconate Cloth  6 each Topical Q0600  . cloNIDine  0.1 mg Oral 2 times per day on Sun Mon Wed Fri  . gabapentin  200 mg Oral 3 times per day on Sun Mon Wed Fri  . gabapentin  300 mg Oral 2 times per day on Tue Thu Sat  . heparin  5,000 Units Subcutaneous Q8H  . hydrALAZINE  100 mg Oral 2 times per day on Sun Mon Wed Fri  . hydrocortisone  25 mg Rectal BID  . insulin aspart  0-5 Units Subcutaneous QHS  . insulin aspart  0-9 Units Subcutaneous TID WC  . isosorbide mononitrate  30 mg Oral Daily  . misoprostol  100 mcg Oral Q6H  . pantoprazole  40 mg Oral BID   Continuous Infusions: . sodium chloride    . sodium chloride    . sodium chloride    . sodium chloride     PRN Meds:.sodium chloride, sodium chloride, sodium chloride, sodium chloride, acetaminophen, alteplase, calcium acetate, heparin, lidocaine (PF), lidocaine (PF), lidocaine-prilocaine, lidocaine-prilocaine, methocarbamol, nitroGLYCERIN, pentafluoroprop-tetrafluoroeth, pentafluoroprop-tetrafluoroeth, technetium labeled red blood cells   Pearson Grippe  MD 06/22/2020, 10:12 AM

## 2020-06-22 NOTE — Plan of Care (Signed)

## 2020-06-23 DIAGNOSIS — K602 Anal fissure, unspecified: Secondary | ICD-10-CM

## 2020-06-23 DIAGNOSIS — K648 Other hemorrhoids: Secondary | ICD-10-CM

## 2020-06-23 DIAGNOSIS — Z8719 Personal history of other diseases of the digestive system: Secondary | ICD-10-CM

## 2020-06-23 DIAGNOSIS — E1122 Type 2 diabetes mellitus with diabetic chronic kidney disease: Secondary | ICD-10-CM | POA: Diagnosis not present

## 2020-06-23 DIAGNOSIS — K922 Gastrointestinal hemorrhage, unspecified: Secondary | ICD-10-CM | POA: Diagnosis not present

## 2020-06-23 DIAGNOSIS — U071 COVID-19: Secondary | ICD-10-CM | POA: Diagnosis not present

## 2020-06-23 DIAGNOSIS — K625 Hemorrhage of anus and rectum: Secondary | ICD-10-CM | POA: Diagnosis not present

## 2020-06-23 LAB — CBC
HCT: 23.5 % — ABNORMAL LOW (ref 36.0–46.0)
HCT: 26.6 % — ABNORMAL LOW (ref 36.0–46.0)
Hemoglobin: 7.1 g/dL — ABNORMAL LOW (ref 12.0–15.0)
Hemoglobin: 8.1 g/dL — ABNORMAL LOW (ref 12.0–15.0)
MCH: 28.6 pg (ref 26.0–34.0)
MCH: 29.6 pg (ref 26.0–34.0)
MCHC: 30.2 g/dL (ref 30.0–36.0)
MCHC: 30.5 g/dL (ref 30.0–36.0)
MCV: 94.8 fL (ref 80.0–100.0)
MCV: 97.1 fL (ref 80.0–100.0)
Platelets: 167 10*3/uL (ref 150–400)
Platelets: 185 10*3/uL (ref 150–400)
RBC: 2.48 MIL/uL — ABNORMAL LOW (ref 3.87–5.11)
RBC: 2.74 MIL/uL — ABNORMAL LOW (ref 3.87–5.11)
RDW: 17.5 % — ABNORMAL HIGH (ref 11.5–15.5)
RDW: 17.6 % — ABNORMAL HIGH (ref 11.5–15.5)
WBC: 4.6 10*3/uL (ref 4.0–10.5)
WBC: 4.8 10*3/uL (ref 4.0–10.5)
nRBC: 0 % (ref 0.0–0.2)
nRBC: 0 % (ref 0.0–0.2)

## 2020-06-23 LAB — RENAL FUNCTION PANEL
Albumin: 2.1 g/dL — ABNORMAL LOW (ref 3.5–5.0)
Anion gap: 13 (ref 5–15)
BUN: 29 mg/dL — ABNORMAL HIGH (ref 8–23)
CO2: 25 mmol/L (ref 22–32)
Calcium: 7.8 mg/dL — ABNORMAL LOW (ref 8.9–10.3)
Chloride: 99 mmol/L (ref 98–111)
Creatinine, Ser: 4.45 mg/dL — ABNORMAL HIGH (ref 0.44–1.00)
GFR, Estimated: 10 mL/min — ABNORMAL LOW (ref >60)
Glucose, Bld: 147 mg/dL — ABNORMAL HIGH (ref 70–99)
Phosphorus: 2.5 mg/dL (ref 2.5–4.6)
Potassium: 3.6 mmol/L (ref 3.5–5.1)
Sodium: 137 mmol/L (ref 135–145)

## 2020-06-23 LAB — GLUCOSE, CAPILLARY
Glucose-Capillary: 98 mg/dL (ref 70–99)
Glucose-Capillary: 139 mg/dL — ABNORMAL HIGH (ref 70–99)
Glucose-Capillary: 129 mg/dL — ABNORMAL HIGH (ref 70–99)
Glucose-Capillary: 124 mg/dL — ABNORMAL HIGH (ref 70–99)

## 2020-06-23 MED ORDER — DILTIAZEM GEL 2 %: Freq: Three times a day (TID) | CUTANEOUS | Status: DC

## 2020-06-23 MED ORDER — LIDOCAINE 5 % EX OINT
TOPICAL_OINTMENT | Freq: Three times a day (TID) | CUTANEOUS | Status: DC | PRN
  Filled 2020-06-23: qty 35.44

## 2020-06-23 NOTE — Plan of Care (Signed)

## 2020-06-23 NOTE — Plan of Care (Signed)
  Problem: Education: Goal: Knowledge of General Education information will improve Description: Including pain rating scale, medication(s)/side effects and non-pharmacologic comfort measures Outcome: Progressing   Problem: Health Behavior/Discharge Planning: Goal: Ability to manage health-related needs will improve Outcome: Progressing   Problem: Activity: Goal: Risk for activity intolerance will decrease Outcome: Progressing   Problem: Elimination: Goal: Will not experience complications related to bowel motility Outcome: Progressing Goal: Will not experience complications related to urinary retention Outcome: Progressing   Problem: Pain Managment: Goal: General experience of comfort will improve Outcome: Progressing   Problem: Safety: Goal: Ability to remain free from injury will improve Outcome: Progressing

## 2020-06-23 NOTE — Progress Notes (Signed)
Admit: 06/20/2020 LOS: 2  58F ESRD THS present with LGIB/ABLA, ASx COVID19+  Subjective:  . No c/o . Hb dropped this AM . For HD today   01/07 0701 - 01/08 0700 In: -  Out: 2000   Filed Weights   06/22/20 0840 06/22/20 1156  Weight: 54.3 kg 52 kg    Scheduled Meds: . acetaminophen  650 mg Oral Once  . atorvastatin  10 mg Oral QHS  . calcium acetate  1,334 mg Oral QAC lunch  . calcium acetate  2,001 mg Oral BID WC  . carvedilol  3.125 mg Oral BID WC  . Chlorhexidine Gluconate Cloth  6 each Topical Q0600  . cloNIDine  0.1 mg Oral 2 times per day on Sun Mon Wed Fri  . gabapentin  200 mg Oral 3 times per day on Sun Mon Wed Fri  . gabapentin  300 mg Oral 2 times per day on Tue Thu Sat  . heparin  5,000 Units Subcutaneous Q8H  . hydrALAZINE  100 mg Oral 2 times per day on Sun Mon Wed Fri  . hydrocortisone  25 mg Rectal BID  . insulin aspart  0-5 Units Subcutaneous QHS  . insulin aspart  0-9 Units Subcutaneous TID WC  . isosorbide mononitrate  30 mg Oral Daily  . misoprostol  100 mcg Oral Q6H  . pantoprazole  40 mg Oral BID   Continuous Infusions: PRN Meds:.acetaminophen, calcium acetate, methocarbamol, nitroGLYCERIN, technetium labeled red blood cells  Current Labs: reviewed    Physical Exam:  Blood pressure 138/70, pulse 91, temperature 99.2 F (37.3 C), temperature source Oral, resp. rate 18, weight 52 kg, last menstrual period 03/16/2002, SpO2 95 %. GEN: NAD, in wheelchair ENT: NCAT EYES: EOMI CV: RRR PULM: CTAB ABD: s/nt/nd SKIN: No rashes/lesiosn EXT:No LE RUE AVF +B/T  Outpt HD Orders Unit: [facility] Days: THS Time: 4h Dialyzer: F180 EDW: 55.5kg K/Ca: 2/2 Access: AVF RUE Needle Size: 15 BFR/DFR: 450/800 UF Proflie: 4 VDRA: C3 1.75 qTx EPO: Mircera 225 q2wk last 12/23 IV Fe: Rec Fe at this time Heparin: none at HD  A 1. ESRD THS: on schedule, HD today, no heparin 2. LGIB, GI following, ongoing 3. ABLA, on Aranesp , transfuse per TRH/GI.   4. CKD-BMD:  stable 5. HTN/VOl: Outpt EDW seems ok 6. CAD 7. ASx COVID19 infection  P . HD today, back on schedule . GI following . Medication Issues; o Preferred narcotic agents for pain control are hydromorphone, fentanyl, and methadone. Morphine should not be used.  o Baclofen should be avoided o Avoid oral sodium phosphate and magnesium citrate based laxatives / bowel preps    Pearson Grippe MD 06/23/2020, 10:50 AM  Recent Labs  Lab 06/20/20 1225 06/21/20 0239 06/22/20 0328  NA 135 136 136  K 3.6 4.1 3.8  CL 95* 98 98  CO2 28 25 23   GLUCOSE 112* 89 106*  BUN 26* 35* 48*  CREATININE 3.92* 4.71* 5.80*  CALCIUM 7.6* 7.7* 8.1*  PHOS  --   --  4.5   Recent Labs  Lab 06/22/20 0328 06/23/20 0139 06/23/20 1015  WBC 5.2 4.6 4.8  HGB 8.3* 7.1* 8.1*  HCT 25.2* 23.5* 26.6*  MCV 91.3 94.8 97.1  PLT 197 167 185

## 2020-06-23 NOTE — Progress Notes (Signed)
Progress Note   Subjective  No complaint today other than some perianal pain with bowel movement She reports stools have been more formed though she has seen some bright red blood with wiping and occasional "dark stools"   Objective  Vital signs in last 24 hours: Temp:  [97.9 F (36.6 C)-100 F (37.8 C)] 99.2 F (37.3 C) (01/08 0852) Pulse Rate:  [83-109] 91 (01/08 0852) Resp:  [15-18] 18 (01/08 0852) BP: (115-158)/(56-83) 138/70 (01/08 0852) SpO2:  [92 %-97 %] 95 % (01/08 0852) Weight:  [52 kg] 52 kg (01/07 1156) Last BM Date: 06/23/20  General: Alert, well-developed, in NAD Heart:  Regular rate and rhythm; 2/6 sem Chest: Clear to ascultation bilaterally Abdomen:  Soft, nontender and nondistended. Normal bowel sounds, without guarding, and without rebound.   Rectal: Posterior anal fissure with tenderness, small hemorrhoids, stool is brown in the vault Extremities:  Without edema, right AKA Neurologic:  Alert and  oriented x4; grossly normal neurologically. Psych:  Alert and cooperative. Normal mood and affect.  Intake/Output from previous day: 01/07 0701 - 01/08 0700 In: -  Out: 2000  Intake/Output this shift: Total I/O In: 240 [P.O.:240] Out: -   Lab Results: Recent Labs    06/22/20 0328 06/23/20 0139 06/23/20 1015  WBC 5.2 4.6 4.8  HGB 8.3* 7.1* 8.1*  HCT 25.2* 23.5* 26.6*  PLT 197 167 185   BMET Recent Labs    06/21/20 0239 06/22/20 0328 06/23/20 1015  NA 136 136 137  K 4.1 3.8 3.6  CL 98 98 99  CO2 25 23 25   GLUCOSE 89 106* 147*  BUN 35* 48* 29*  CREATININE 4.71* 5.80* 4.45*  CALCIUM 7.7* 8.1* 7.8*   LFT Recent Labs    06/21/20 0239 06/22/20 0328 06/23/20 1015  PROT 5.1*  --   --   ALBUMIN 2.3*   < > 2.1*  AST 19  --   --   ALT 15  --   --   ALKPHOS 57  --   --   BILITOT 0.8  --   --    < > = values in this interval not displayed.    Studies/Results: NM GI Blood Loss  Result Date: 06/21/2020 CLINICAL DATA:  65 year old female  with GI bleed. EXAM: NUCLEAR MEDICINE GASTROINTESTINAL BLEEDING SCAN TECHNIQUE: Sequential abdominal images were obtained following intravenous administration of Tc-79m labeled red blood cells. RADIOPHARMACEUTICALS:  24.6 mCi Tc-51m pertechnetate in-vitro labeled red cells. COMPARISON:  CT abdomen pelvis dated 01/27/2020. FINDINGS: Physiologic uptake in the liver, spleen, and cardiac blood pool. Activity is also noted within the abdominal aorta and iliac vessels. Excreted radiotracer noted within the urinary bladder. No abnormal activity identified conforming to the bowel to suggest active bleed. IMPRESSION: No scintigraphic evidence of active GI bleed. Electronically Signed   By: Anner Crete M.D.   On: 06/21/2020 17:20      Assessment & Recommendations  65 year old female with recurrent GI bleeding and history of gastric and duodenal angioectasias, end-stage renal disease on dialysis, diabetes, Covid positive without respiratory compromise here with GI bleeding.  1.  GI bleeding/history of proximal GI angioectasias/anal fissure and hemorrhoids --her perianal pain is secondary to anal fissure.  Certainly some of her red blood with bowel movement could be hemorrhoidal but I do not think this explains her recurrent drop in hemoglobin.  Bleeding scan negative.  Most recent bleeding less consistent with diverticular hemorrhage.  She has had multiple endoscopies in the last year. --Treat anal  fissure with diltiazem gel 2% 3 times daily, RectiCare as needed for perianal pain --Plan small bowel enteroscopy tomorrow versus Monday with monitored anesthesia care --given her recent endoscopies upper GI bleeding is felt most likely to explain anemia (as this level of recurrent anemia is rare with hemorrhoids and anal fissure alone) --Hemoglobin down another gram today, monitor closely --Continue PPI  2. ESRD --nephrology following, Dr. Joelyn Oms.  She will hemodialysis today which is on her scheduled  3.   Covid positive --asymptomatic without respiratory compromise        LOS: 2 days   Jerene Bears  06/23/2020, 11:24 AM

## 2020-06-23 NOTE — Progress Notes (Signed)
PROGRESS NOTE  Brandy Houston:[id number] DOB: 30-Jun-1955 DOA: 06/20/2020 PCP: [name], MD  Brief History   65 year old woman PMH including ESRD, diabetes mellitus, recurrent GI bleeds, gastric and proximal duodenal AVM, presented with bright red blood per rectum including clots.  Seen last month by GI and underwent EGD at that time.  Transfused 2 units PRBC in the emergency department and admitted for further evaluation of suspected lower GI bleed and treatment of acute blood loss anemia.  Seen by gastroenterology, nuclear medicine scan pursued which was unrevealing.  GI plans endoscopy in the next 48 hours.  A & P  GI bleed, painless hematochezia, thought lower, with associated acute blood loss anemia. PMH includes recurrent upper GI bleed secondary to AVMs, status post cauterization on several occasions.  Also known to have pandiverticulosis and hemorrhoids. --Hemoglobin remains stable status post 2 units PRBC but continues to have some bleeding.  RBC nuclear medicine study was unrevealing.   --Repeat CBC in a.m. GI plans endoscopy.--Continue PPI, Cytotec (from prior admit)  COVID-19 positive without respiratory compromise.  Incidental finding.  Asymptomatic.  No treatment or laboratory studies indicated. --Remains stable.  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 HgbA1c 4.8. --CBG stable, continue SSI --Continue gabapentin  Coronary artery disease managed medically --continue atorvastatin  Essential hypertension --Stable.  Continue carvedilol, clonidine, hydralazine, Imdur.  Prolonged QT.  Zoloft and trazodone held. --Resolved on EKG 1/7 independently reviewed  Disposition Plan:  Discussion: Still some bleeding.  CBC thus far stable.  GI plans for further investigation.  Home when cleared by GI and bleeding resolved.  Status is:  Inpatient  Remains inpatient appropriate because:Inpatient level of care appropriate due to severity of illness   Dispo: The patient is from: Home              Anticipated d/c is to: Home              Anticipated d/c date is: 2 days              Patient currently is not medically stable to d/c.  DVT prophylaxis: heparin injection 5,000 Units Start: 06/20/20 1630   Code Status: Full Code Family Communication: none  Murray Hodgkins, MD  [facility] Direct contact: see [email or web address] (further directions at bottom of note if needed) 7PM-7AM contact night coverage as at bottom of note 06/23/2020, 2:07 PM  LOS: 2 days   Significant Hospital Events   .    Consults:  . GI   Procedures:  .   Significant Diagnostic Tests:  [name]    Micro Data:  .    Antimicrobials:  .   Interval History/Subjective  CC: f/u bleeding  Feels ok today but has rectal pain w/ bowel movements. Still some bleeding red and dark from rectum.  Breathing ok.  Objective   Vitals:  Vitals:   06/23/20 0502 06/23/20 0852  BP: (!) (P) 110/56 138/70  Pulse: (P) 83 91  Resp: (P) 16 18  Temp: (P) 97.9 F (36.6 C) 99.2 F (37.3 C)  SpO2:  95%    Exam: Constitutional:   . Appears calm and comfortable ENMT:  . grossly normal hearing  Respiratory:  . CTA bilaterally, no w/r/r.  . Respiratory effort normal.  Cardiovascular:  . RRR, no m/r/g Psychiatric:  . Mental status o Mood, affect appropriate  I have personally reviewed the following:  Today's Data  . CBG stable . BMP noted, K+ WNL . Hgb stable at 8.1. Plts WNL.  Scheduled Meds: . acetaminophen  650 mg Oral Once  . atorvastatin  10 mg Oral QHS  . calcium acetate  1,334 mg Oral QAC lunch  . calcium acetate  2,001 mg Oral BID WC  . carvedilol  3.125 mg Oral BID WC  . Chlorhexidine Gluconate Cloth  6 each Topical Q0600  . cloNIDine  0.1 mg Oral 2 times per day on Sun Mon Wed Fri  . gabapentin  200 mg Oral 3 times per day on Sun Mon  Wed Fri  . gabapentin  300 mg Oral 2 times per day on Tue Thu Sat  . heparin  5,000 Units Subcutaneous Q8H  . hydrALAZINE  100 mg Oral 2 times per day on Sun Mon Wed Fri  . hydrocortisone  25 mg Rectal BID  . insulin aspart  0-5 Units Subcutaneous QHS  . insulin aspart  0-9 Units Subcutaneous TID WC  . isosorbide mononitrate  30 mg Oral Daily  . misoprostol  100 mcg Oral Q6H  . pantoprazole  40 mg Oral BID   Continuous Infusions:   Principal Problem:   GIB (gastrointestinal bleeding) Active Problems:   End stage renal disease ([name])   Diabetic peripheral neuropathy ([name])   DM type 2 causing ESRD ([name])   Anemia, blood loss   Anemia of chronic renal failure   Coronary artery disease of native artery of native heart with stable angina pectoris ([name])   Rectal bleeding   Lab test positive for detection of COVID-19 virus   LOS: 2 days   How to contact the [facility] Attending or Consulting provider 7A - 7P or covering provider during after hours [place], for this patient?  1. Check the care team in [facility] and look for a) attending/consulting TRH provider listed and b) the [facility] team listed 2. Log into [email or web address] and use [place]'s universal password to access. If you do not have the password, please contact the hospital operator. 3. Locate the [facility] provider you are looking for under [facility] and page to a number that you can be directly reached. 4. If you still have difficulty reaching the provider, please page the [facility] (Director on Call) for the Hospitalists listed on amion for assistance.

## 2020-06-24 ENCOUNTER — Encounter (HOSPITAL_COMMUNITY): Payer: Self-pay | Admitting: Internal Medicine

## 2020-06-24 DIAGNOSIS — K922 Gastrointestinal hemorrhage, unspecified: Secondary | ICD-10-CM | POA: Diagnosis not present

## 2020-06-24 DIAGNOSIS — E1122 Type 2 diabetes mellitus with diabetic chronic kidney disease: Secondary | ICD-10-CM | POA: Diagnosis not present

## 2020-06-24 DIAGNOSIS — D500 Iron deficiency anemia secondary to blood loss (chronic): Secondary | ICD-10-CM | POA: Diagnosis not present

## 2020-06-24 DIAGNOSIS — U071 COVID-19: Secondary | ICD-10-CM | POA: Diagnosis not present

## 2020-06-24 DIAGNOSIS — K5520 Angiodysplasia of colon without hemorrhage: Secondary | ICD-10-CM

## 2020-06-24 LAB — CBC
HCT: 26.4 % — ABNORMAL LOW (ref 36.0–46.0)
Hemoglobin: 8.2 g/dL — ABNORMAL LOW (ref 12.0–15.0)
MCH: 29.5 pg (ref 26.0–34.0)
MCHC: 31.1 g/dL (ref 30.0–36.0)
MCV: 95 fL (ref 80.0–100.0)
Platelets: 183 10*3/uL (ref 150–400)
RBC: 2.78 MIL/uL — ABNORMAL LOW (ref 3.87–5.11)
RDW: 16.9 % — ABNORMAL HIGH (ref 11.5–15.5)
WBC: 4.8 10*3/uL (ref 4.0–10.5)
nRBC: 0 % (ref 0.0–0.2)

## 2020-06-24 LAB — GLUCOSE, CAPILLARY
Glucose-Capillary: 86 mg/dL (ref 70–99)
Glucose-Capillary: 161 mg/dL — ABNORMAL HIGH (ref 70–99)
Glucose-Capillary: 100 mg/dL — ABNORMAL HIGH (ref 70–99)
Glucose-Capillary: 92 mg/dL (ref 70–99)

## 2020-06-24 NOTE — Progress Notes (Signed)
PROGRESS NOTE  Brandy Houston JTT:[id number] DOB: [birth year] DOA: 06/20/2020 PCP: [name], MD  Brief History   65 year old woman PMH including ESRD, diabetes mellitus, recurrent GI bleeds, gastric and proximal duodenal AVM, presented with bright red blood per rectum including clots.  Seen last month by GI and underwent EGD at that time.  Transfused 2 units PRBC in the emergency department and admitted for further evaluation of suspected lower GI bleed and treatment of acute blood loss anemia.  Seen by gastroenterology, nuclear medicine scan pursued which was unrevealing.  GI plans endoscopy 1/10.  A & P  GI bleed, painless hematochezia, thought lower, with associated acute blood loss anemia. PMH includes recurrent upper GI bleed secondary to AVMs, status post cauterization on several occasions.  Also known to have pandiverticulosis and hemorrhoids.  RBC nuclear study was unrevealing. --Hemoglobin remained stable after transfusion.  Remains stable status post 2 units PRBC but continues to have some bleeding.   --Continue PPI, Cytotec (from prior admit) --Endoscopy in a.m.  COVID-19 positive without respiratory compromise.  Incidental finding.  Asymptomatic.  No treatment or laboratory studies indicated. --Asymptomatic.  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 HgbA1c 4.8. --CBG remains stable, continue SSI --Continue gabapentin  Coronary artery disease managed medically --continue atorvastatin  Essential hypertension --Remains stable.  Continue carvedilol, clonidine, hydralazine, Imdur.  Prolonged QT.  Zoloft and trazodone held. --Resolved on EKG 1/7 independently reviewed  Disposition Plan:  Discussion: Appears to be minor bleeding, hemoglobin remained stable, check CBC in a.m.  Endoscopy in a.m.  Home when cleared by gastroenterology.  Status is:  Inpatient  Remains inpatient appropriate because:Inpatient level of care appropriate due to severity of illness   Dispo: The patient is from: Home              Anticipated d/c is to: Home              Anticipated d/c date is: 1 day              Patient currently is not medically stable to d/c.  DVT prophylaxis: heparin injection 5,000 Units Start: 06/20/20 1630   Code Status: Full Code Family Communication: none  Murray Hodgkins, MD  [facility] Direct contact: see [email or web address] (further directions at bottom of note if needed) 7PM-7AM contact night coverage as at bottom of note 06/24/2020, 5:47 PM  LOS: 3 days   Significant Hospital Events   .    Consults:  . GI   Procedures:  .   Significant Diagnostic Tests:  [name]    Micro Data:  .    Antimicrobials:  .   Interval History/Subjective  CC: f/u bleeding  Still some bleeding. Feels ok though, no complaints except abd cramping.  Objective   Vitals:  Vitals:   06/24/20 0914 06/24/20 1627  BP: 137/69 119/62  Pulse: 93 82  Resp: 17 16  Temp: 98.6 F (37 C) 98.5 F (36.9 C)  SpO2: 98% 98%    Exam: Constitutional:   . Appears calm and comfortable ENMT:  . grossly normal hearing  Respiratory:  . CTA bilaterally, no w/r/r.  . Respiratory effort normal. Cardiovascular:  . RRR, no m/r/g Psychiatric:  . Mental status o Mood, affect appropriate  I have personally reviewed the following:   Today's Data  . CBG stable . Hgb stable at 8.2  Scheduled Meds: . acetaminophen  650  mg Oral Once  . atorvastatin  10 mg Oral QHS  . calcium acetate  1,334 mg Oral QAC lunch  . calcium acetate  2,001 mg Oral BID WC  . carvedilol  3.125 mg Oral BID WC  . Chlorhexidine Gluconate Cloth  6 each Topical Q0600  . cloNIDine  0.1 mg Oral 2 times per day on Sun Mon Wed Fri  . gabapentin  200 mg Oral 3 times per day on Sun Mon Wed Fri  . gabapentin  300 mg Oral 2 times per day on Tue Thu Sat  . heparin  5,000 Units  Subcutaneous Q8H  . hydrALAZINE  100 mg Oral 2 times per day on Sun Mon Wed Fri  . hydrocortisone  25 mg Rectal BID  . insulin aspart  0-5 Units Subcutaneous QHS  . insulin aspart  0-9 Units Subcutaneous TID WC  . isosorbide mononitrate  30 mg Oral Daily  . misoprostol  100 mcg Oral Q6H  . pantoprazole  40 mg Oral BID   Continuous Infusions:   Principal Problem:   GIB (gastrointestinal bleeding) Active Problems:   End stage renal disease ([name])   Diabetic peripheral neuropathy ([name])   DM type 2 causing ESRD ([name])   Anemia, blood loss   Anemia of chronic renal failure   Coronary artery disease of native artery of native heart with stable angina pectoris ([name])   Rectal bleeding   Lab test positive for detection of COVID-19 virus   Intestinal angiodysplasia   LOS: 3 days   How to contact the [facility] Attending or Consulting provider 7A - 7P or covering provider during after hours [place], for this patient?  1. Check the care team in [facility] and look for a) attending/consulting TRH provider listed and b) the [facility] team listed 2. Log into [email or web address] and use [place]'s universal password to access. If you do not have the password, please contact the hospital operator. 3. Locate the [facility] provider you are looking for under [facility] and page to a number that you can be directly reached. 4. If you still have difficulty reaching the provider, please page the [facility] (Director on Call) for the Hospitalists listed on amion for assistance.

## 2020-06-24 NOTE — H&P (View-Only) (Signed)
    Progress Note   Subjective  Chief Complaint: GI bleeding  Today, the patient tells me that she had multiple episodes of bright red blood per rectum yesterday, also continues to complain of pain with bowel movements.  Describes continued diarrhea.  Aware of plans for enteroscopy tomorrow.  Does ask about getting out of isolation but is still coughing.   Objective   Vital signs in last 24 hours: Temp:  [98.6 F (37 C)-100.2 F (37.9 C)] 98.6 F (37 C) (01/09 0914) Pulse Rate:  [78-94] 93 (01/09 0914) Resp:  [15-17] 17 (01/09 0914) BP: (120-137)/(65-78) 137/69 (01/09 0914) SpO2:  [96 %-98 %] 98 % (01/09 0914) Last BM Date: 06/24/20 General:  AA female in NAD Heart:  Regular rate and rhythm; no murmurs Lungs: Respirations even and unlabored, lungs CTA bilaterally Abdomen:  Soft, nontender and nondistended. Normal bowel sounds. Neurologic:  Alert and oriented,  grossly normal neurologically. Psych:  Cooperative. Normal mood and affect.  Intake/Output from previous day: 01/08 0701 - 01/09 0700 In: 240 [P.O.:240] Out: -  Intake/Output this shift: Total I/O In: 240 [P.O.:240] Out: -   Lab Results: Recent Labs    06/23/20 0139 06/23/20 1015 06/24/20 0304  WBC 4.6 4.8 4.8  HGB 7.1* 8.1* 8.2*  HCT 23.5* 26.6* 26.4*  PLT 167 185 183   BMET Recent Labs    06/22/20 0328 06/23/20 1015  NA 136 137  K 3.8 3.6  CL 98 99  CO2 23 25  GLUCOSE 106* 147*  BUN 48* 29*  CREATININE 5.80* 4.45*  CALCIUM 8.1* 7.8*   LFT Recent Labs    06/23/20 1015  ALBUMIN 2.1*     Assessment / Plan:   Assessment: 1.  GI bleeding/history of proximal GI angiectasia/anal fissure and hemorrhoids: Perianal pain is secondary to anal fissure, certainly some of the blood with a bowel movement could be hemorrhoidal, but likely not causing recurrent drop in hemoglobin, bleeding scan negative, multiple endoscopies in the last year 2.  ESRD on dialysis 3.  Covid positive: Some coughing but  without respiratory compromise  Plan: 1.  Continue RectiCare as needed for perianal pain.  (We were advised that neither Diltiazem gel nor Nitroglycerin ointment are available in the hospital, these would be recommended 3 times daily when she is discharged) 2.  Plan for small bowel enteroscopy tomorrow 3.  Continue to monitor hemoglobin and transfusion as needed less than 7 4.  Continue PPI 5.  Please await further recommendations from Dr. Hilarie Fredrickson.  Thank you for kind consultation.  We will continue to follow.   LOS: 3 days   Levin Erp  06/24/2020, 11:46 AM

## 2020-06-24 NOTE — Plan of Care (Signed)
Patient will be having an enteroscopy done on 1/10. Patient is able to move independently with wheelchair around her room. No signs of symptoms of bloody bms this shift. Gave Tylenol 1 g x 1 for headache. Airborne/contact precautions in place due to being COVID+. No apparent distress or needs voiced. Will continue to monitor and continue current POC.

## 2020-06-24 NOTE — Progress Notes (Signed)
Dr. Joelyn Oms order to move pt. HD treatment to tomorrow

## 2020-06-24 NOTE — Progress Notes (Signed)
    Progress Note   Subjective  Chief Complaint: GI bleeding  Today, the patient tells me that she had multiple episodes of bright red blood per rectum yesterday, also continues to complain of pain with bowel movements.  Describes continued diarrhea.  Aware of plans for enteroscopy tomorrow.  Does ask about getting out of isolation but is still coughing.   Objective   Vital signs in last 24 hours: Temp:  [98.6 F (37 C)-100.2 F (37.9 C)] 98.6 F (37 C) (01/09 0914) Pulse Rate:  [78-94] 93 (01/09 0914) Resp:  [15-17] 17 (01/09 0914) BP: (120-137)/(65-78) 137/69 (01/09 0914) SpO2:  [96 %-98 %] 98 % (01/09 0914) Last BM Date: 06/24/20 General:  AA female in NAD Heart:  Regular rate and rhythm; no murmurs Lungs: Respirations even and unlabored, lungs CTA bilaterally Abdomen:  Soft, nontender and nondistended. Normal bowel sounds. Neurologic:  Alert and oriented,  grossly normal neurologically. Psych:  Cooperative. Normal mood and affect.  Intake/Output from previous day: 01/08 0701 - 01/09 0700 In: 240 [P.O.:240] Out: -  Intake/Output this shift: Total I/O In: 240 [P.O.:240] Out: -   Lab Results: Recent Labs    06/23/20 0139 06/23/20 1015 06/24/20 0304  WBC 4.6 4.8 4.8  HGB 7.1* 8.1* 8.2*  HCT 23.5* 26.6* 26.4*  PLT 167 185 183   BMET Recent Labs    06/22/20 0328 06/23/20 1015  NA 136 137  K 3.8 3.6  CL 98 99  CO2 23 25  GLUCOSE 106* 147*  BUN 48* 29*  CREATININE 5.80* 4.45*  CALCIUM 8.1* 7.8*   LFT Recent Labs    06/23/20 1015  ALBUMIN 2.1*     Assessment / Plan:   Assessment: 1.  GI bleeding/history of proximal GI angiectasia/anal fissure and hemorrhoids: Perianal pain is secondary to anal fissure, certainly some of the blood with a bowel movement could be hemorrhoidal, but likely not causing recurrent drop in hemoglobin, bleeding scan negative, multiple endoscopies in the last year 2.  ESRD on dialysis 3.  Covid positive: Some coughing but  without respiratory compromise  Plan: 1.  Continue RectiCare as needed for perianal pain.  (We were advised that neither Diltiazem gel nor Nitroglycerin ointment are available in the hospital, these would be recommended 3 times daily when she is discharged) 2.  Plan for small bowel enteroscopy tomorrow 3.  Continue to monitor hemoglobin and transfusion as needed less than 7 4.  Continue PPI 5.  Please await further recommendations from Dr. Hilarie Fredrickson.  Thank you for kind consultation.  We will continue to follow.   LOS: 3 days   Levin Erp  06/24/2020, 11:46 AM

## 2020-06-25 ENCOUNTER — Encounter (HOSPITAL_COMMUNITY)
Admission: EM | Disposition: A | Discharge status: Home / Self Care | Payer: Self-pay | Source: Home / Self Care | Attending: Family Medicine

## 2020-06-25 ENCOUNTER — Inpatient Hospital Stay (HOSPITAL_COMMUNITY): Payer: Medicare Other | Admitting: Anesthesiology

## 2020-06-25 ENCOUNTER — Encounter (HOSPITAL_COMMUNITY): Payer: Self-pay | Admitting: Internal Medicine

## 2020-06-25 DIAGNOSIS — K5521 Angiodysplasia of colon with hemorrhage: Secondary | ICD-10-CM | POA: Diagnosis not present

## 2020-06-25 DIAGNOSIS — K922 Gastrointestinal hemorrhage, unspecified: Secondary | ICD-10-CM | POA: Diagnosis not present

## 2020-06-25 DIAGNOSIS — B3781 Candidal esophagitis: Secondary | ICD-10-CM

## 2020-06-25 DIAGNOSIS — D500 Iron deficiency anemia secondary to blood loss (chronic): Secondary | ICD-10-CM | POA: Diagnosis not present

## 2020-06-25 DIAGNOSIS — E1142 Type 2 diabetes mellitus with diabetic polyneuropathy: Secondary | ICD-10-CM | POA: Diagnosis not present

## 2020-06-25 DIAGNOSIS — K31819 Angiodysplasia of stomach and duodenum without bleeding: Secondary | ICD-10-CM

## 2020-06-25 HISTORY — PX: ENTEROSCOPY: SHX5533

## 2020-06-25 HISTORY — PX: HOT HEMOSTASIS: SHX5433

## 2020-06-25 LAB — CBC
HCT: 23.7 % — ABNORMAL LOW (ref 36.0–46.0)
Hemoglobin: 7.8 g/dL — ABNORMAL LOW (ref 12.0–15.0)
MCH: 30.2 pg (ref 26.0–34.0)
MCHC: 32.9 g/dL (ref 30.0–36.0)
MCV: 91.9 fL (ref 80.0–100.0)
Platelets: 165 10*3/uL (ref 150–400)
RBC: 2.58 MIL/uL — ABNORMAL LOW (ref 3.87–5.11)
RDW: 16.2 % — ABNORMAL HIGH (ref 11.5–15.5)
WBC: 5 10*3/uL (ref 4.0–10.5)
nRBC: 0 % (ref 0.0–0.2)

## 2020-06-25 LAB — GLUCOSE, CAPILLARY
Glucose-Capillary: 83 mg/dL (ref 70–99)
Glucose-Capillary: 80 mg/dL (ref 70–99)
Glucose-Capillary: 96 mg/dL (ref 70–99)

## 2020-06-25 SURGERY — ENTEROSCOPY
Anesthesia: Monitor Anesthesia Care

## 2020-06-25 MED ORDER — FLUCONAZOLE 100 MG PO TABS: 100.0000 mg | ORAL_TABLET | Freq: Every day | ORAL | 0 refills | Status: AC

## 2020-06-25 MED ORDER — ASCORBIC ACID 500 MG PO TABS: 500.0000 mg | ORAL_TABLET | Freq: Every day | ORAL | Status: DC

## 2020-06-25 MED ORDER — NICOTINE 14 MG/24HR TD PT24
14.0000 mg | MEDICATED_PATCH | Freq: Every day | TRANSDERMAL | Status: DC
  Administered 2020-06-25: 14 mg via TRANSDERMAL
  Filled 2020-06-25: qty 1

## 2020-06-25 MED ORDER — DILTIAZEM GEL 2 %: 1.0000 "application " | Freq: Three times a day (TID) | CUTANEOUS | 1 refills | Status: AC

## 2020-06-25 MED ORDER — SODIUM CHLORIDE 0.9 % IV SOLN: INTRAVENOUS | Status: DC | PRN

## 2020-06-25 MED ORDER — PHENYLEPHRINE 40 MCG/ML (10ML) SYRINGE FOR IV PUSH (FOR BLOOD PRESSURE SUPPORT)
PREFILLED_SYRINGE | INTRAVENOUS | Status: DC | PRN
  Administered 2020-06-25: 120 ug via INTRAVENOUS
  Administered 2020-06-25: 80 ug via INTRAVENOUS

## 2020-06-25 MED ORDER — ONDANSETRON HCL 4 MG/2ML IJ SOLN
INTRAMUSCULAR | Status: DC | PRN
  Administered 2020-06-25: 4 mg via INTRAVENOUS

## 2020-06-25 MED ORDER — PHENYLEPHRINE 40 MCG/ML (10ML) SYRINGE FOR IV PUSH (FOR BLOOD PRESSURE SUPPORT): PREFILLED_SYRINGE | INTRAVENOUS | Status: DC | PRN

## 2020-06-25 MED ORDER — ZINC SULFATE 220 (50 ZN) MG PO TABS: 220.0000 mg | ORAL_TABLET | Freq: Every day | ORAL | 0 refills | Status: DC

## 2020-06-25 MED ORDER — RENA-VITE PO TABS: 1.0000 | ORAL_TABLET | Freq: Every day | ORAL | Status: DC

## 2020-06-25 MED ORDER — IRON (FERROUS SULFATE) 325 (65 FE) MG PO TABS: 325.0000 mg | ORAL_TABLET | Freq: Every day | ORAL | Status: AC

## 2020-06-25 MED ORDER — PHENYLEPHRINE HCL-NACL 10-0.9 MG/250ML-% IV SOLN
INTRAVENOUS | Status: DC | PRN
  Administered 2020-06-25: 25 ug/min via INTRAVENOUS

## 2020-06-25 MED ORDER — LIDOCAINE 2% (20 MG/ML) 5 ML SYRINGE
INTRAMUSCULAR | Status: DC | PRN
  Administered 2020-06-25: 80 mg via INTRAVENOUS

## 2020-06-25 MED ORDER — PROPOFOL 500 MG/50ML IV EMUL
INTRAVENOUS | Status: DC | PRN
  Administered 2020-06-25: 75 ug/kg/min via INTRAVENOUS

## 2020-06-25 MED ORDER — NEPRO/CARBSTEADY PO LIQD: 237.0000 mL | Freq: Two times a day (BID) | ORAL | Status: DC

## 2020-06-25 MED ORDER — FLUCONAZOLE 100 MG PO TABS
100.0000 mg | ORAL_TABLET | Freq: Every day | ORAL | Status: DC
  Filled 2020-06-25: qty 1

## 2020-06-25 MED ORDER — HYDROCORTISONE ACETATE 25 MG RE SUPP: 25.0000 mg | Freq: Two times a day (BID) | RECTAL | 0 refills | Status: AC

## 2020-06-25 MED ORDER — PROPOFOL 10 MG/ML IV BOLUS
INTRAVENOUS | Status: DC | PRN
  Administered 2020-06-25: 20 mg via INTRAVENOUS
  Administered 2020-06-25 (×2): 10 mg via INTRAVENOUS

## 2020-06-25 NOTE — Progress Notes (Signed)
Pt was given her AVS discharge summary and went over with her. IV was removed with catheter intact. Pt had no further questions.  

## 2020-06-25 NOTE — Progress Notes (Signed)
Admit: 06/20/2020 LOS: 4  Subjective: no c/o today, seen in room  01/09 0701 - 01/10 0700 In: 240 [P.O.:240] Out: -   Filed Weights   06/22/20 0840 06/22/20 1156 06/24/20 1735  Weight: 54.3 kg 52 kg 54.4 kg    Scheduled Meds: . acetaminophen  650 mg Oral Once  . atorvastatin  10 mg Oral QHS  . calcium acetate  1,334 mg Oral QAC lunch  . calcium acetate  2,001 mg Oral BID WC  . carvedilol  3.125 mg Oral BID WC  . Chlorhexidine Gluconate Cloth  6 each Topical Q0600  . cloNIDine  0.1 mg Oral 2 times per day on Sun Mon Wed Fri  . gabapentin  200 mg Oral 3 times per day on Sun Mon Wed Fri  . gabapentin  300 mg Oral 2 times per day on Tue Thu Sat  . heparin  5,000 Units Subcutaneous Q8H  . hydrALAZINE  100 mg Oral 2 times per day on Sun Mon Wed Fri  . hydrocortisone  25 mg Rectal BID  . insulin aspart  0-5 Units Subcutaneous QHS  . insulin aspart  0-9 Units Subcutaneous TID WC  . isosorbide mononitrate  30 mg Oral Daily  . misoprostol  100 mcg Oral Q6H  . nicotine  14 mg Transdermal Daily  . pantoprazole  40 mg Oral BID   Continuous Infusions: PRN Meds:.acetaminophen, calcium acetate, lidocaine, methocarbamol, nitroGLYCERIN, technetium labeled red blood cells  Current Labs: reviewed    Physical Exam:  Blood pressure (!) 145/68, pulse 85, temperature 98.6 F (37 C), temperature source Oral, resp. rate 17, height 5\' 7"  (1.702 m), weight 54.4 kg, last menstrual period 03/16/2002, SpO2 98 %. GEN: NAD, in wheelchair ENT: NCAT EYES: EOMI CV: RRR PULM: CTAB ABD: s/nt/nd SKIN: No rashes/lesiosn EXT:No LE RUE AVF +B/T  Outpt HD: [facility] TTS   4h  55.5kg  450/800  2/2 bath  RUE AVF  P4  Hep none   - calcitriol 1.75 po tiw  - mircera 225 q2, last 12.23  Assessment/ Plan 1. ESRD THS: on schedule, HD tomorrow,  no heparin 2. LGIB: GI following, ongoing 3. ABLA: on Aranesp, transfuse per TRH/GI.   4. CKD-BMD: stable 5. HTN/VOl: Outpt EDW seems ok 6. CAD 7. ASx COVID19  infection  Kelly Splinter, MD 06/25/2020, 1:19 PM       Recent Labs  Lab 06/21/20 0239 06/22/20 0328 06/23/20 1015  NA 136 136 137  K 4.1 3.8 3.6  CL 98 98 99  CO2 25 23 25   GLUCOSE 89 106* 147*  BUN 35* 48* 29*  CREATININE 4.71* 5.80* 4.45*  CALCIUM 7.7* 8.1* 7.8*  PHOS  --  4.5 2.5   Recent Labs  Lab 06/23/20 1015 06/24/20 0304 06/25/20 0257  WBC 4.8 4.8 5.0  HGB 8.1* 8.2* 7.8*  HCT 26.6* 26.4* 23.7*  MCV 97.1 95.0 91.9  PLT 185 183 165

## 2020-06-25 NOTE — Op Note (Signed)
[facility] Patient Name: Brandy Houston Procedure Date : 06/25/2020 MRN: [id number] Attending MD: Milus Banister , MD Date of Birth: Mar 07, 1956 CSN: [id number] Age: 65 Admit Type: Inpatient Procedure:                Small bowel enteroscopy Indications:              h/o UGI and small bowel AVMs, several ablated even                            recently. Now with acute on chronic anemia,                            hematochezia felt at least partially due to                            hemorrhoids/fissure. Providers:                Milus Banister, MD, Elmer Ramp. Tilden Dome, RN,                            William Dalton, Technician Referring MD:              Medicines:                Monitored Anesthesia Care Complications:            No immediate complications. Estimated blood loss:                            None. Estimated Blood Loss:     Estimated blood loss: none. Procedure:                Pre-Anesthesia Assessment:                           - Prior to the procedure, a History and Physical                            was performed, and patient medications and                            allergies were reviewed. [name] tolerance of                            previous anesthesia was also reviewed. The risks                            and benefits of the procedure and the sedation                            options and risks were discussed with the patient.                            All questions were answered, and informed consent  was obtained. Prior Anticoagulants: The patient has                            taken no previous anticoagulant or antiplatelet                            agents. ASA Grade Assessment: IV - A patient with                            severe systemic disease that is a constant threat                            to life. After reviewing the risks and benefits,                            the patient was deemed in  satisfactory condition to                            undergo the procedure.                           After obtaining informed consent, the endoscope was                            passed under direct vision. Throughout the                            procedure, the patient's blood pressure, pulse, and                            oxygen saturations were monitored continuously. The                            [id number]-[id number] ([id number]) Olympus pediatric colonoscope                            was introduced through the mouth and advanced to                            the proximal jejunum. The small bowel enteroscopy                            was accomplished without difficulty. The patient                            tolerated the procedure well. Scope In: Scope Out: Findings:      White exudate throughout the esophagus, consistent with candida       infection.      Two non-bleeding AVMs were found in the UGI tract; both were relatively       small (1-6mm), located in duodenal bulb and proximal jejunum. Both were       ablated with application of APC.      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 Impression:               -  White exudate throughout the esophagus,                            consistent with candida infection.                           - Two non-bleeding AVMs were found in the UGI                            tract; both were relatively small (1-89mm), located                            in duodenal bulb and proximal jejunum. Both were                            ablated with application of APC.                           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 No evidence of                            cirrhosis clinically or by labs/ imaging however. Recommendation:           - Follow for overt bleeding.                           - She should continue daily iron supplement,                             indefinitely                           - I will order diflucan 100mg , one pill daily for                            10 days. Procedure Code(s):        --- Professional ---                           [phone], Small intestinal endoscopy, enteroscopy                            beyond second portion of duodenum, not including                            ileum; with control of bleeding (eg, injection,                            bipolar cautery, unipolar cautery, laser, heater                            probe, stapler, plasma coagulator) Diagnosis Code(s):        ---  Professional ---                           P53.614, Angiodysplasia of stomach and duodenum                            without bleeding                           D50.0, Iron deficiency anemia secondary to blood                            loss (chronic) CPT copyright 2019 [employer]. All rights reserved. The codes documented in this report are preliminary and upon coder review may  be revised to meet current compliance requirements. Milus Banister, MD 06/25/2020 2:50:38 PM This report has been signed electronically. Number of Addenda: 0

## 2020-06-25 NOTE — Progress Notes (Signed)
Initial Nutrition Assessment  DOCUMENTATION CODES:   Non-severe (moderate) malnutrition in context of chronic illness  INTERVENTION:   -Nepro Shake po BID, each supplement provides 425 kcal and 19 grams protein  -Renal MVI once daily.   NUTRITION DIAGNOSIS:   Moderate Malnutrition related to chronic illness (ESRD) as evidenced by moderate fat depletion,moderate muscle depletion,severe muscle depletion,percent weight loss.  GOAL:   Patient will meet greater than or equal to 90% of their needs  MONITOR:   PO intake,Supplement acceptance,Labs,Weight trends,I & O's  REASON FOR ASSESSMENT:   Malnutrition Screening Tool    ASSESSMENT:   65 y.o. female with PMH including ESRD, DM, recurrent GI bleeds, and gastric and proximal duodenal AVM. Seen last month by GI and underwent EGD. Transfused 2 units PRBC in ED and admitted for further evaluation of suspected GI bleed. Endoscopy this AM.  Spoke with pt at bedside. Pt endorses that she has been feeling weak and lost weight but was not sure of how much. She reports she has had recurrent diarrhea for a few weeks now and this is causing her appetite to be decreased.   Pt stated that she goes to HD on a TTS schedule. She stated that on days she has HD, she will not eat very much. She stated on these days she will have crackers in the morning and maybe a snack that evening. On days she does not have HD her typical intake includes: Breakfast: crackers, Lunch: sandwich or a snack, Dinner: green beans and fried chicken. Pt stated that she has Nepro at HD occasionally but can only tolerate it mixed with coffee as it upsets her stomach. Per chart review, pt consuming 100% of last four documented meal records.   Pt stated she has lost weight but could not state how much. She reports she has gradually lost weight over the past few years and cannot seem to gain it back. She reports her EDW is 55 kg. Per nephrology note, her EDW is 55.5 kg. Pt weight  appears to remain relatively stable fluctuating between 59-63 kg for the last 5 months until current admission. Weight on 8/14: 60.4 kg. Weight on 12/11: 59 kg. Current weight: 54.3 kg. This recent decrease in weight indicates an 8% weight loss in 1 month, which is significant for time frame. Physical exam indicates area of severe and moderate depletions. Areas of depletion in legs likely d/t prolonged immobility.   Pt underwent UKA in 2018 causing her mobility to be limited. She reports she has a prosthetic leg but primarily uses a wheel chair.   Labs reviewed: CBG's x 24 hours: 83-161  Medications reviewed and include: Calcium acetate, Novolog SSI, Protonix   NUTRITION - FOCUSED PHYSICAL EXAM:  Flowsheet Row Most Recent Value  Orbital Region Moderate depletion  Upper Arm Region Moderate depletion  Thoracic and Lumbar Region No depletion  Buccal Region Mild depletion  Temple Region Moderate depletion  Clavicle Bone Region Severe depletion  Clavicle and Acromion Bone Region Severe depletion  Scapular Bone Region Unable to assess  Dorsal Hand Moderate depletion  Patellar Region Severe depletion  Anterior Thigh Region Severe depletion  Posterior Calf Region Severe depletion  Edema (RD Assessment) None  Hair Reviewed  Eyes Reviewed  Mouth Reviewed  Skin Reviewed  Nails Reviewed       Diet Order:   Diet Order            Diet renal with fluid restriction Fluid restriction: 1200 mL Fluid; Room service appropriate? Yes;  Fluid consistency: Thin  Diet effective now                 EDUCATION NEEDS:   No education needs have been identified at this time  Skin:  Skin Assessment: Reviewed RN Assessment  Last BM:  06/24/2020  Height:   Ht Readings from Last 1 Encounters:  06/25/20 5\' 7"  (1.702 m)    Weight:   Wt Readings from Last 1 Encounters:  06/25/20 54.4 kg    BMI:  Body mass index is 18.78 kg/m.  Estimated Nutritional Needs:   Kcal:  1400-1600  kcal  Protein:  70-80 grams  Fluid:  1000 mL + UOP    Ronnald Nian, Dietetic Intern Pager: [phone] If unavailable: [phone]

## 2020-06-25 NOTE — Transfer of Care (Signed)
Immediate Anesthesia Transfer of Care Note  Patient: Brandy Houston  Procedure(s) Performed: ENTEROSCOPY (N/A ) HOT HEMOSTASIS (ARGON PLASMA COAGULATION/BICAP) (N/A )  Patient Location: Endoscopy Unit  Anesthesia Type:MAC  Level of Consciousness: awake, alert  and patient cooperative  Airway & Oxygen Therapy: Patient Spontanous Breathing  Post-op Assessment: Report given to RN, Post -op Vital signs reviewed and stable and Patient moving all extremities  Post vital signs: Reviewed and stable  Last Vitals:  Vitals Value Taken Time  BP 96/37 06/25/20 1443  Temp 36.9 C 06/25/20 1443  Pulse 90 06/25/20 1443  Resp 26 06/25/20 1443  SpO2 100 % 06/25/20 1443    Last Pain:  Vitals:   06/25/20 1443  TempSrc: Temporal  PainSc: Asleep         Complications: No complications documented.

## 2020-06-25 NOTE — Anesthesia Preprocedure Evaluation (Addendum)
Anesthesia Evaluation  Patient identified by MRN, date of birth, ID band Patient awake    Reviewed: Allergy & Precautions, H&P , NPO status , Patient's Chart, lab work & pertinent test results  Airway Mallampati: II  TM Distance: >3 FB Neck ROM: full    Dental no notable dental hx.    Pulmonary Current Smoker and Patient abstained from smoking.,  COVID+   Pulmonary exam normal breath sounds clear to auscultation       Cardiovascular hypertension, + CAD and +CHF  + Valvular Problems/Murmurs AS  Rhythm:regular Rate:Normal + Systolic murmurs Moderate AS. EF 40-45%   Neuro/Psych PSYCHIATRIC DISORDERS Anxiety Depression  Neuromuscular disease    GI/Hepatic GERD  ,  Endo/Other  diabetes  Renal/GU ESRFRenal disease     Musculoskeletal  (+) Arthritis ,   Abdominal   Peds  Hematology  (+) Blood dyscrasia, anemia ,   Anesthesia Other Findings   Reproductive/Obstetrics                            Anesthesia Physical Anesthesia Plan  ASA: IV  Anesthesia Plan: MAC   Post-op Pain Management:    Induction: Intravenous  PONV Risk Score and Plan: 1 and Ondansetron, Propofol infusion and Treatment may vary due to age or medical condition  Airway Management Planned: Nasal Cannula  Additional Equipment:   Intra-op Plan:   Post-operative Plan:   Informed Consent: I have reviewed the patients History and Physical, chart, labs and discussed the procedure including the risks, benefits and alternatives for the proposed anesthesia with the patient or authorized representative who has indicated his/her understanding and acceptance.       Plan Discussed with: CRNA, Anesthesiologist and Surgeon  Anesthesia Plan Comments:         Anesthesia Quick Evaluation

## 2020-06-25 NOTE — Discharge Summary (Signed)
Physician Discharge Summary  Brandy Houston LYY:[id number] DOB: 1955/09/13 DOA: 06/20/2020  PCP: [name], MD  Admit date: 06/20/2020 Discharge date: 06/25/2020  Recommendations for Outpatient Follow-up:  1. Follow-up anemia, GIB 2. Resolution of esophageal candidiasis 3. Iron supplementation as per GI   Follow-up Information    [name], MD. Schedule an appointment as soon as possible for a visit in 1 week(s).   Specialty: Family Medicine Contact information: [address] [place] [address] [phone]        Donato Heinz, MD .   Specialties: Cardiology, Radiology Contact information: [address] [place] [address] [phone]                Discharge Diagnoses: Principal diagnosis is #1 Principal Problem:   GIB (gastrointestinal bleeding) Active Problems:   End stage renal disease ([place])   Diabetic peripheral neuropathy ([place])   DM type 2 causing ESRD ([place])   Anemia, blood loss   Anemia of chronic renal failure   AVM (arteriovenous malformation) of small bowel, acquired with hemorrhage   Coronary artery disease of native artery of native heart with stable angina pectoris ([name])   Rectal bleeding   Lab test positive for detection of COVID-19 virus   Intestinal angiodysplasia   Esophageal candidiasis ([place])   Discharge Condition: improved Disposition: home  Diet recommendation:  Diet Orders (From admission, onward)    Start     Ordered   06/25/20 1557  Diet renal with fluid restriction Fluid restriction: 1200 mL Fluid; Room service appropriate? Yes; Fluid consistency: Thin  Diet effective now       Question Answer Comment  Fluid restriction: 1200 mL Fluid   Room service appropriate? Yes   Fluid consistency: Thin      06/25/20 1556           Filed Weights   06/22/20 1156 06/24/20 1735 06/25/20 1415  Weight: 52 kg 54.4 kg 54.4 kg    HPI/Hospital Course:   65 year old woman PMH including ESRD,  diabetes mellitus, recurrent GI bleeds, gastric and proximal duodenal AVM, presented with bright red blood per rectum including clots.  Seen last month by GI and underwent EGD at that time.  Transfused 2 units PRBC in the emergency department and admitted for further evaluation of suspected lower GI bleed and treatment of acute blood loss anemia.  Seen by gastroenterology, nuclear medicine scan pursued which was unrevealing.  Subsequently underwent small bowel enteroscopy which showed white exudate throughout the esophagus consistent with Candida infection, 2 nonbleeding AVMs both ablated with APC.  A & P  GI bleed, painless hematochezia, thought lower, with associated acute blood loss anemia. PMH includes recurrent upper GI bleed secondary to AVMs, status post cauterization on several occasions.  Also known to have pandiverticulosis and hemorrhoids.  RBC nuclear study was unrevealing.  Small bowel enteroscopy showed 2 nonbleeding AVMs. --Hemoglobin remained stable after transfusion.  Bleeding has stopped. --Continue PPI, Cytotec (from prior admit) --GI rec'd indefinite iron supplementation --Follow-up as an outpatient  Esophageal candidiasis -- \Diflucan for 10 days  COVID-19 positive without respiratory compromise.  Incidental finding.  Asymptomatic.  No treatment or laboratory studies indicated. --Remained asymptomatic.  Continue supportive care  ESRD --Stable.  Continue dialysis as per nephrology  Diabetes mellitus type 2 with peripheral neuropathy and peripheral vascular disease status post AKA. HgbA1c 4.8. --CBG stable, continue SSI --Continue gabapentin  Coronary artery disease managed medically --continue atorvastatin  Essential hypertension --stable.  Continue carvedilol,  clonidine, hydralazine, Imdur.  Prolonged QT.  Zoloft and trazodone held. --Resolved on EKG 1/7 independently reviewed  Today's assessment: S: CC: f/u bleeding  No bleeding today Feels ok Some  diarrhea Ready to go home  O: Vitals:  Vitals:   06/25/20 1503 06/25/20 1623  BP: 137/75 131/67  Pulse: 85 84  Resp: 18   Temp:  98 F (36.7 C)  SpO2: 97% 99%    Constitutional:  . Appears calm and comfortable Respiratory:  . CTA bilaterally, no w/r/r.  . Respiratory effort normal.  Cardiovascular:  . RRR, no m/r/g Abdomen:  . Soft, ntnd Psychiatric:  . judgement and insight appear normal . Mental status o Mood, affect appropriate  CBG stable Hgb stable at 7.8  Discharge Instructions  Discharge Instructions    Discharge instructions   Complete by: As directed    Call your physician or seek immediate medical attention for bleeding, pain, swelling, shortness of breath, confusion or worsening of condition. Continue renal diet. Self-isolate through 1/18   Increase activity slowly   Complete by: As directed      Allergies as of 06/25/2020      Reactions   Compazine [prochlorperazine] Shortness Of Breath, Swelling, Other (See Comments)   TONGUE SWELLS   Shellfish-derived Products Anaphylaxis   Iodinated Diagnostic Agents Hives, Rash   Omnipaque [iohexol] Hives   Tape    Brown paper tape caused skin irritation   Sulfa Antibiotics Rash      Medication List    TAKE these medications   Accu-Chek Guide test strip Generic drug: glucose blood Check blood sugar up to 4 times a day   Accu-Chek Guide w/Device Kit 1 Device by Does not apply route daily.   accu-chek softclix lancets Check sugars TID for E11.65   Accu-Chek Softclix Lancets lancets Check sugars three times a day for E11.65   acetaminophen 500 MG tablet Commonly known as: TYLENOL Take 500-1,000 mg by mouth every 6 (six) hours as needed (for pain.).   ascorbic acid 500 MG tablet Commonly known as: VITAMIN C Take 1 tablet (500 mg total) by mouth daily.   atorvastatin 10 MG tablet Commonly known as: LIPITOR Take 1 tablet (10 mg total) by mouth at bedtime.   blood glucose meter kit and supplies  Kit Dispense based on patient and insurance preference. Use up to four times daily as directed   calcium acetate 667 MG capsule Commonly known as: PHOSLO Take 1,334-2,001 mg by mouth See admin instructions. Take 3 capsules (2001 mg) by mouth with breakfast, take 2 capsules (1334 mg) by mouth with lunch, take 3 capsules (2001 mg) by mouth with supper & take 2 capsules (1334 mg) by mouth with snacks.   carvedilol 3.125 MG tablet Commonly known as: COREG Take 1 tablet (3.125 mg total) by mouth 2 (two) times daily.   cloNIDine 0.1 MG tablet Commonly known as: CATAPRES Take 0.1 mg by mouth See admin instructions. Bid on non-dialysis days( m,w,f,sun)   diltiazem 2 % Gel Apply 1 application topically 3 (three) times daily. Apply to affected area on anus   diphenhydrAMINE 25 mg capsule Commonly known as: BENADRYL Take 25 mg by mouth every 6 (six) hours as needed for allergies.   fluconazole 100 MG tablet Commonly known as: DIFLUCAN Take 1 tablet (100 mg total) by mouth daily for 9 days.   gabapentin 300 MG capsule Commonly known as: NEURONTIN Take 300 mg by mouth See admin instructions. Take 1 capsule (300 mg) by mouth twice daily  on Tuesdays, Thursdays, & Saturdays.   gabapentin 100 MG capsule Commonly known as: NEURONTIN Take 200 mg by mouth See admin instructions. Take 2 capsules (200 mg) by mouth 3 times on Sundays, Mondays, Wednesdays, & Fridays.   hydrALAZINE 100 MG tablet Commonly known as: APRESOLINE Take 100 mg by mouth See admin instructions. Bid on non-dialysis days.( m,w,f,sun)   hydrocortisone 25 MG suppository Commonly known as: ANUSOL-HC Place 1 suppository (25 mg total) rectally 2 (two) times daily.   Iron (Ferrous Sulfate) 325 (65 Fe) MG Tabs Take 325 mg by mouth daily. Note that this medication will turn stool black and may cause constipation.   isosorbide mononitrate 30 MG 24 hr tablet Commonly known as: IMDUR Take 1 tablet (30 mg total) by mouth daily.    lidocaine-prilocaine cream Commonly known as: EMLA Apply 1 application topically Every Tuesday,Thursday,and Saturday with dialysis. On days of dialysis 3x week   methocarbamol 500 MG tablet Commonly known as: ROBAXIN Take 500 mg by mouth every 8 (eight) hours as needed for muscle spasms.   MIRCERA IJ Get it at dialysis   misoprostol 100 MCG tablet Commonly known as: CYTOTEC Take 1 tablet (100 mcg total) by mouth every 6 (six) hours.   multivitamin Tabs tablet Take 1 tablet by mouth daily.   nitroGLYCERIN 0.4 MG SL tablet Commonly known as: NITROSTAT Place 1 tablet (0.4 mg total) under the tongue every 5 (five) minutes as needed.   pantoprazole 40 MG tablet Commonly known as: Protonix Take 1 tablet (40 mg total) by mouth 2 (two) times daily. 40 mg BID for 2 months & then once daily   polyethylene glycol 17 g packet Commonly known as: MIRALAX / GLYCOLAX Take 17 g by mouth daily as needed for moderate constipation.   sertraline 100 MG tablet Commonly known as: ZOLOFT Take 2 tablets (200 mg total) by mouth daily.   traZODone 50 MG tablet Commonly known as: DESYREL Take 50 mg by mouth at bedtime.   Zinc Sulfate 220 (50 Zn) MG Tabs Take 1 tablet (220 mg total) by mouth daily.      Allergies  Allergen Reactions  . Compazine [Prochlorperazine] Shortness Of Breath, Swelling and Other (See Comments)    TONGUE SWELLS  . Shellfish-Derived Products Anaphylaxis  . Iodinated Diagnostic Agents Hives and Rash  . Omnipaque [Iohexol] Hives  . Tape     Brown paper tape caused skin irritation  . Sulfa Antibiotics Rash    The results of significant diagnostics from this hospitalization (including imaging, microbiology, ancillary and laboratory) are listed below for reference.    Significant Diagnostic Studies: NM GI Blood Loss  Result Date: 06/21/2020 CLINICAL DATA:  65 year old female with GI bleed. EXAM: NUCLEAR MEDICINE GASTROINTESTINAL BLEEDING SCAN TECHNIQUE: Sequential  abdominal images were obtained following intravenous administration of Tc-38mlabeled red blood cells. RADIOPHARMACEUTICALS:  24.6 mCi Tc-951mertechnetate in-vitro labeled red cells. COMPARISON:  CT abdomen pelvis dated 01/27/2020. FINDINGS: Physiologic uptake in the liver, spleen, and cardiac blood pool. Activity is also noted within the abdominal aorta and iliac vessels. Excreted radiotracer noted within the urinary bladder. No abnormal activity identified conforming to the bowel to suggest active bleed. IMPRESSION: No scintigraphic evidence of active GI bleed. Electronically Signed   By: ArAnner Crete.D.   On: 06/21/2020 17:20   MR HIP RIGHT WO CONTRAST  Addendum Date: 05/27/2020   ADDENDUM REPORT: 05/27/2020 09:41 ADDENDUM: Patient has a subtle torn labrum superiorly in the RIGHT hip joint noted on images 13-17 of  series 11. Electronically Signed   By: Suzy Bouchard M.D.   On: 05/27/2020 09:41   Result Date: 05/27/2020 CLINICAL DATA:  FALL 1 WEEK PRIOR. SWELLING OF THE RIGHT HIP. LOW HEMOGLOBIN. NORMAL WHITE BLOOD CELL COUNT EXAM: MR OF THE RIGHT HIP WITHOUT CONTRAST TECHNIQUE: Multiplanar, multisequence MR imaging was performed. No intravenous contrast was administered. COMPARISON:  None. FINDINGS: Bones: No evidence of fracture of the RIGHT hip. No sacral fracture. Hips are located. Articular cartilage and labrum Articular cartilage:  No acute findings Labrum:  No acute finding Joint or bursal effusion Joint effusion:  No effusion Bursae: No bursitis Muscles and tendons Muscles and tendons:  Unremarkable Other findings Miscellaneous: There is an ovoid mass within the subcutaneous tissue of the RIGHT hip overlying the greater trochanter measuring 13.0 x 4.5 by 5.5 cm (volume = 170 cm^3). There is edema within the subcutaneous tissue surrounding the mass. Findings are most consistent with hematoma associated with trauma. No evidence of abscess or infection. IMPRESSION: 1. Large superficial  subcutaneous hematoma overlying the RIGHT hip. 2. No evidence of abscess. 3. No RIGHT hip fracture or infection. Electronically Signed: By: Suzy Bouchard M.D. On: 05/27/2020 04:52    Microbiology: Recent Results (from the past 240 hour(s))  SARS CORONAVIRUS 2 (TAT 6-24 HRS) Nasopharyngeal Nasopharyngeal Swab     Status: Abnormal   Collection Time: 06/20/20  3:32 PM   Specimen: Nasopharyngeal Swab  Result Value Ref Range Status   SARS Coronavirus 2 POSITIVE (A) NEGATIVE Final    Comment: RESULT CALLED TO, READ BACK BY AND VERIFIED WITH: Notified Jamelle Haring Rosa,RN @ 2336 on 06/20/2020 by L Lamont (NOTE) SARS-CoV-2 target nucleic acids are DETECTED.  The SARS-CoV-2 RNA is generally detectable in upper and lower respiratory specimens during the acute phase of infection. Positive results are indicative of the presence of SARS-CoV-2 RNA. Clinical correlation with patient history and other diagnostic information is  necessary to determine patient infection status. Positive results do not rule out bacterial infection or co-infection with other viruses.  The expected result is Negative.  Fact Sheet for Patients: SugarRoll.be  Fact Sheet for Healthcare Providers: https://www.woods-mathews.com/  This test is not yet approved or cleared by the [place] FDA and  has been authorized for detection and/or diagnosis of SARS-CoV-2 by FDA under an Emergency Use Authorization (EUA). This EUA will remain  in effect (meaning th is test can be used) for the duration of the COVID-19 declaration under Section 564(b)(1) of the Act, 21 U.S.C. section 360bbb-3(b)(1), unless the authorization is terminated or revoked sooner.   Performed at [facility], [place] [address] [place], [place] [address]      Labs: Basic Metabolic Panel: Recent Labs  Lab 06/20/20 1225 06/21/20 0239 06/22/20 0328 06/23/20 1015  NA 135 136 136 137  K 3.6 4.1 3.8 3.6  CL  95* 98 98 99  CO2 _0 GLUCOSE 112* 89 106* 147*  BUN 26* 35* 48* 29*  CREATININE 3.92* 4.71* 5.80* 4.45*  CALCIUM 7.6* 7.7* 8.1* 7.8*  PHOS  --   --  4.5 2.5   Liver Function Tests: Recent Labs  Lab 06/20/20 1225 06/21/20 0239 06/22/20 0328 06/23/20 1015  AST 16 19  --   --   ALT 16 15  --   --   ALKPHOS 57 57  --   --   BILITOT 0.8 0.8  --   --   PROT 4.8* 5.1*  --   --  ALBUMIN 2.3* 2.3* 2.2* 2.1*   CBC: Recent Labs  Lab 06/22/20 0328 06/23/20 0139 06/23/20 1015 06/24/20 0304 06/25/20 0257  WBC 5.2 4.6 4.8 4.8 5.0  HGB 8.3* 7.1* 8.1* 8.2* 7.8*  HCT 25.2* 23.5* 26.6* 26.4* 23.7*  MCV 91.3 94.8 97.1 95.0 91.9  PLT 197 167 185 183 165   CBG: Recent Labs  Lab 06/24/20 1625 06/24/20 2227 06/25/20 0644 06/25/20 1127 06/25/20 1619  GLUCAP 100* 92 83 80 96    Principal Problem:   GIB (gastrointestinal bleeding) Active Problems:   End stage renal disease ([name])   Diabetic peripheral neuropathy ([name])   DM type 2 causing ESRD ([name])   Anemia, blood loss   Anemia of chronic renal failure   AVM (arteriovenous malformation) of small bowel, acquired with hemorrhage   Coronary artery disease of native artery of native heart with stable angina pectoris ([name])   Rectal bleeding   Lab test positive for detection of COVID-19 virus   Intestinal angiodysplasia   Esophageal candidiasis ([place])   Time coordinating discharge: 25 minutes  Signed:  Murray Hodgkins, MD  [facility]  06/25/2020, 6:15 PM

## 2020-06-25 NOTE — Care Management Important Message (Signed)
Important Message  Patient Details  Name: QUINTASIA THEROUX MRN: [id number] Date of Birth: 12/28/1955   Medicare Important Message Given:  Yes - Important Message mailed due to current National Emergency   Verbal consent obtained due to current National Emergency  Relationship to patient: Self Contact Name: Fern Canova Call Date: 06/25/20  Time: 1404 Phone: [phone] Outcome: No Answer/Busy Important Message mailed to: Patient address on file    Delorse Lek 06/25/2020, 2:04 PM

## 2020-06-25 NOTE — Progress Notes (Signed)
Patient's outpatient COVID isolation  shift is TTS 5:20pm, call when arrive, wait to be called in at her home clinic/[facility] on [address] spoke with patient who asked for Navigator to call her son regarding transportation. Patient's son states he has no issue with transporting her to this shift.  Navigator updated team, patient and clinic. No barriers to discharge.   Alphonzo Cruise, [place] [facility] Navigator [phone]

## 2020-06-25 NOTE — Interval H&P Note (Signed)
History and Physical Interval Note:  06/25/2020 2:12 PM  Brandy Houston  has presented today for surgery, with the diagnosis of , Acute blood loss anemia, blood in stool, history of gastric and small bowel angioectasias.  The various methods of treatment have been discussed with the patient and family. After consideration of risks, benefits and other options for treatment, the patient has consented to  Procedure(s): ENTEROSCOPY (N/A) as a surgical intervention.  [name] history has been reviewed, patient examined, no change in status, stable for surgery.  I have reviewed the patient's chart and labs.  Questions were answered to the patient's satisfaction.     Milus Banister

## 2020-06-26 ENCOUNTER — Telehealth: Payer: Self-pay | Admitting: Nephrology

## 2020-06-26 DIAGNOSIS — N186 End stage renal disease: Secondary | ICD-10-CM | POA: Diagnosis not present

## 2020-06-26 DIAGNOSIS — D509 Iron deficiency anemia, unspecified: Secondary | ICD-10-CM | POA: Diagnosis not present

## 2020-06-26 DIAGNOSIS — E1129 Type 2 diabetes mellitus with other diabetic kidney complication: Secondary | ICD-10-CM | POA: Diagnosis not present

## 2020-06-26 DIAGNOSIS — Z992 Dependence on renal dialysis: Secondary | ICD-10-CM | POA: Diagnosis not present

## 2020-06-26 DIAGNOSIS — D631 Anemia in chronic kidney disease: Secondary | ICD-10-CM | POA: Diagnosis not present

## 2020-06-26 DIAGNOSIS — N2581 Secondary hyperparathyroidism of renal origin: Secondary | ICD-10-CM | POA: Diagnosis not present

## 2020-06-26 DIAGNOSIS — U071 COVID-19: Secondary | ICD-10-CM | POA: Diagnosis not present

## 2020-06-26 NOTE — Progress Notes (Signed)
Pt called and said she told the Dr the wrong pharmacy. I let Dr. Sarajane Jews know. Dr stated they should transfer to right pharmacy since both are [facility]. I told her to call the pharmacy and get them to transfer it. I told her to let me know if they will not transfer it.

## 2020-06-26 NOTE — Anesthesia Postprocedure Evaluation (Signed)
Anesthesia Post Note  Patient: KHAMRYN CALDERONE  Procedure(s) Performed: ENTEROSCOPY (N/A ) HOT HEMOSTASIS (ARGON PLASMA COAGULATION/BICAP) (N/A )     Patient location during evaluation: PACU Anesthesia Type: MAC Level of consciousness: awake and alert Pain management: pain level controlled Vital Signs Assessment: post-procedure vital signs reviewed and stable Respiratory status: spontaneous breathing, nonlabored ventilation, respiratory function stable and patient connected to nasal cannula oxygen Cardiovascular status: stable and blood pressure returned to baseline Postop Assessment: no apparent nausea or vomiting Anesthetic complications: no   No complications documented.  Last Vitals:  Vitals:   06/25/20 1503 06/25/20 1623  BP: 137/75 131/67  Pulse: 85 84  Resp: 18   Temp:  36.7 C  SpO2: 97% 99%    Last Pain:  Vitals:   06/25/20 1503  TempSrc:   PainSc: 0-No pain                 Oddis Westling S

## 2020-06-26 NOTE — Telephone Encounter (Signed)
Transition of Care Contact from [place]  Date of Discharge: 06/25/20 Date of Contact: 06/26/20 Method of contact: phone - attempted  Attempted to contact patient to discuss transition of care from inpatient admission.  Patient did not answer the phone.  Message was left on patient's voicemail informing them we would attempt to call them again and if unable to reach will follow up at dialysis.  Jen Mow, PA-C [facility] Pager: [phone]

## 2020-06-26 NOTE — Telephone Encounter (Signed)
Transition of Care Contact from [place]   Date of Discharge: 06/25/20 Date of Contact: 06/26/20 Method of contact: phone Talked to patient   Patient contacted to discuss transition of care form recent hospitaliztion. Patient was admitted to [facility] from 06/20/20 to 06/25/20 with the discharge diagnosis of acute GIB.    Medication changes were reviewed.   Patient will follow up with is outpatient dialysis center 06/26/20.   Other follow up needs include none identified.   Jen Mow, PA-C [facility] Pager: [phone]

## 2020-06-27 ENCOUNTER — Telehealth: Payer: Self-pay

## 2020-06-27 NOTE — Telephone Encounter (Cosign Needed)
Transition Care Management Follow-up Telephone Call  Date of discharge and from where: 06/25/20 [facility] [place]  How have you been since you were released from the hospital? ok  Any questions or concerns? Yes  Items Reviewed:  Did the pt receive and understand the discharge instructions provided? Yes   Medications obtained and verified? Yes   Other? No   Any new allergies since your discharge? No   Dietary orders reviewed? Yes  Do you have support at home? Yes   Home Care and Equipment/Supplies: Were home health services ordered? not applicable If so, what is the name of the agency?   Has the agency set up a time to come to the patient's home? not applicable Were any new equipment or medical supplies ordered?  No What is the name of the medical supply agency?  Were you able to get the supplies/equipment? not applicable Do you have any questions related to the use of the equipment or supplies? No  Functional Questionnaire: (I = Independent and D = Dependent) ADLs: I   Bathing/Dressing- I  Meal Prep- I  Eating- I  Maintaining continence- I  Transferring/Ambulation- I  Managing Meds- I  Follow up appointments reviewed:   PCP Hospital f/u appt confirmed? No  Pt stated she will have to check her schedule and make an appt to come in for HFU  Are transportation arrangements needed? No   If their condition worsens, is the pt aware to call PCP or go to the Emergency Dept.? Yes  Was the patient provided with contact information for the PCP's office or ED? Yes  Was to pt encouraged to call back with questions or concerns? Yes

## 2020-06-27 NOTE — Telephone Encounter (Signed)
Patient did not return call. She was in the ED on 06/20/20. She is follow up with her PCP. She has follow up with cardiology in March. She had enteroscopy by Dr Ardis Hughs 06/20/20.

## 2020-06-27 NOTE — Telephone Encounter (Signed)
ok 

## 2020-06-28 DIAGNOSIS — N186 End stage renal disease: Secondary | ICD-10-CM | POA: Diagnosis not present

## 2020-06-28 DIAGNOSIS — D509 Iron deficiency anemia, unspecified: Secondary | ICD-10-CM | POA: Diagnosis not present

## 2020-06-28 DIAGNOSIS — E1129 Type 2 diabetes mellitus with other diabetic kidney complication: Secondary | ICD-10-CM | POA: Diagnosis not present

## 2020-06-28 DIAGNOSIS — U071 COVID-19: Secondary | ICD-10-CM | POA: Diagnosis not present

## 2020-06-28 DIAGNOSIS — D631 Anemia in chronic kidney disease: Secondary | ICD-10-CM | POA: Diagnosis not present

## 2020-06-28 DIAGNOSIS — Z992 Dependence on renal dialysis: Secondary | ICD-10-CM | POA: Diagnosis not present

## 2020-06-28 DIAGNOSIS — N2581 Secondary hyperparathyroidism of renal origin: Secondary | ICD-10-CM | POA: Diagnosis not present

## 2020-07-03 DIAGNOSIS — E1129 Type 2 diabetes mellitus with other diabetic kidney complication: Secondary | ICD-10-CM | POA: Diagnosis not present

## 2020-07-03 DIAGNOSIS — Z992 Dependence on renal dialysis: Secondary | ICD-10-CM | POA: Diagnosis not present

## 2020-07-03 DIAGNOSIS — D509 Iron deficiency anemia, unspecified: Secondary | ICD-10-CM | POA: Diagnosis not present

## 2020-07-03 DIAGNOSIS — N2581 Secondary hyperparathyroidism of renal origin: Secondary | ICD-10-CM | POA: Diagnosis not present

## 2020-07-03 DIAGNOSIS — Z111 Encounter for screening for respiratory tuberculosis: Secondary | ICD-10-CM | POA: Diagnosis not present

## 2020-07-03 DIAGNOSIS — D631 Anemia in chronic kidney disease: Secondary | ICD-10-CM | POA: Diagnosis not present

## 2020-07-03 DIAGNOSIS — U071 COVID-19: Secondary | ICD-10-CM | POA: Diagnosis not present

## 2020-07-03 DIAGNOSIS — N186 End stage renal disease: Secondary | ICD-10-CM | POA: Diagnosis not present

## 2020-07-05 DIAGNOSIS — D631 Anemia in chronic kidney disease: Secondary | ICD-10-CM | POA: Diagnosis not present

## 2020-07-05 DIAGNOSIS — N2581 Secondary hyperparathyroidism of renal origin: Secondary | ICD-10-CM | POA: Diagnosis not present

## 2020-07-05 DIAGNOSIS — N186 End stage renal disease: Secondary | ICD-10-CM | POA: Diagnosis not present

## 2020-07-05 DIAGNOSIS — D509 Iron deficiency anemia, unspecified: Secondary | ICD-10-CM | POA: Diagnosis not present

## 2020-07-05 DIAGNOSIS — E1129 Type 2 diabetes mellitus with other diabetic kidney complication: Secondary | ICD-10-CM | POA: Diagnosis not present

## 2020-07-05 DIAGNOSIS — Z992 Dependence on renal dialysis: Secondary | ICD-10-CM | POA: Diagnosis not present

## 2020-07-07 DIAGNOSIS — N2581 Secondary hyperparathyroidism of renal origin: Secondary | ICD-10-CM | POA: Diagnosis not present

## 2020-07-07 DIAGNOSIS — D509 Iron deficiency anemia, unspecified: Secondary | ICD-10-CM | POA: Diagnosis not present

## 2020-07-07 DIAGNOSIS — Z992 Dependence on renal dialysis: Secondary | ICD-10-CM | POA: Diagnosis not present

## 2020-07-07 DIAGNOSIS — E1129 Type 2 diabetes mellitus with other diabetic kidney complication: Secondary | ICD-10-CM | POA: Diagnosis not present

## 2020-07-07 DIAGNOSIS — D631 Anemia in chronic kidney disease: Secondary | ICD-10-CM | POA: Diagnosis not present

## 2020-07-07 DIAGNOSIS — N186 End stage renal disease: Secondary | ICD-10-CM | POA: Diagnosis not present

## 2020-07-10 DIAGNOSIS — D509 Iron deficiency anemia, unspecified: Secondary | ICD-10-CM | POA: Diagnosis not present

## 2020-07-10 DIAGNOSIS — N186 End stage renal disease: Secondary | ICD-10-CM | POA: Diagnosis not present

## 2020-07-10 DIAGNOSIS — N2581 Secondary hyperparathyroidism of renal origin: Secondary | ICD-10-CM | POA: Diagnosis not present

## 2020-07-10 DIAGNOSIS — E1129 Type 2 diabetes mellitus with other diabetic kidney complication: Secondary | ICD-10-CM | POA: Diagnosis not present

## 2020-07-10 DIAGNOSIS — Z992 Dependence on renal dialysis: Secondary | ICD-10-CM | POA: Diagnosis not present

## 2020-07-10 DIAGNOSIS — D631 Anemia in chronic kidney disease: Secondary | ICD-10-CM | POA: Diagnosis not present

## 2020-07-12 DIAGNOSIS — Z992 Dependence on renal dialysis: Secondary | ICD-10-CM | POA: Diagnosis not present

## 2020-07-12 DIAGNOSIS — D509 Iron deficiency anemia, unspecified: Secondary | ICD-10-CM | POA: Diagnosis not present

## 2020-07-12 DIAGNOSIS — N186 End stage renal disease: Secondary | ICD-10-CM | POA: Diagnosis not present

## 2020-07-12 DIAGNOSIS — D631 Anemia in chronic kidney disease: Secondary | ICD-10-CM | POA: Diagnosis not present

## 2020-07-12 DIAGNOSIS — N2581 Secondary hyperparathyroidism of renal origin: Secondary | ICD-10-CM | POA: Diagnosis not present

## 2020-07-12 DIAGNOSIS — E1129 Type 2 diabetes mellitus with other diabetic kidney complication: Secondary | ICD-10-CM | POA: Diagnosis not present

## 2020-07-14 ENCOUNTER — Other Ambulatory Visit: Payer: Self-pay | Admitting: Cardiology

## 2020-07-14 ENCOUNTER — Other Ambulatory Visit: Payer: Self-pay | Admitting: Internal Medicine

## 2020-07-14 DIAGNOSIS — N186 End stage renal disease: Secondary | ICD-10-CM | POA: Diagnosis not present

## 2020-07-14 DIAGNOSIS — Z992 Dependence on renal dialysis: Secondary | ICD-10-CM | POA: Diagnosis not present

## 2020-07-14 DIAGNOSIS — D509 Iron deficiency anemia, unspecified: Secondary | ICD-10-CM | POA: Diagnosis not present

## 2020-07-14 DIAGNOSIS — E1129 Type 2 diabetes mellitus with other diabetic kidney complication: Secondary | ICD-10-CM | POA: Diagnosis not present

## 2020-07-14 DIAGNOSIS — D631 Anemia in chronic kidney disease: Secondary | ICD-10-CM | POA: Diagnosis not present

## 2020-07-14 DIAGNOSIS — N2581 Secondary hyperparathyroidism of renal origin: Secondary | ICD-10-CM | POA: Diagnosis not present

## 2020-07-16 ENCOUNTER — Other Ambulatory Visit: Payer: Self-pay

## 2020-07-16 MED ORDER — ISOSORBIDE MONONITRATE ER 30 MG PO TB24: 30.0000 mg | ORAL_TABLET | Freq: Every day | ORAL | 3 refills | Status: AC

## 2020-07-17 DIAGNOSIS — Z992 Dependence on renal dialysis: Secondary | ICD-10-CM | POA: Diagnosis not present

## 2020-07-17 DIAGNOSIS — N186 End stage renal disease: Secondary | ICD-10-CM | POA: Diagnosis not present

## 2020-07-17 DIAGNOSIS — N2581 Secondary hyperparathyroidism of renal origin: Secondary | ICD-10-CM | POA: Diagnosis not present

## 2020-07-17 DIAGNOSIS — D509 Iron deficiency anemia, unspecified: Secondary | ICD-10-CM | POA: Diagnosis not present

## 2020-07-17 DIAGNOSIS — E1129 Type 2 diabetes mellitus with other diabetic kidney complication: Secondary | ICD-10-CM | POA: Diagnosis not present

## 2020-07-17 DIAGNOSIS — D631 Anemia in chronic kidney disease: Secondary | ICD-10-CM | POA: Diagnosis not present

## 2020-07-19 DIAGNOSIS — N2581 Secondary hyperparathyroidism of renal origin: Secondary | ICD-10-CM | POA: Diagnosis not present

## 2020-07-19 DIAGNOSIS — D631 Anemia in chronic kidney disease: Secondary | ICD-10-CM | POA: Diagnosis not present

## 2020-07-19 DIAGNOSIS — Z992 Dependence on renal dialysis: Secondary | ICD-10-CM | POA: Diagnosis not present

## 2020-07-19 DIAGNOSIS — D509 Iron deficiency anemia, unspecified: Secondary | ICD-10-CM | POA: Diagnosis not present

## 2020-07-19 DIAGNOSIS — N186 End stage renal disease: Secondary | ICD-10-CM | POA: Diagnosis not present

## 2020-07-24 DIAGNOSIS — Z992 Dependence on renal dialysis: Secondary | ICD-10-CM | POA: Diagnosis not present

## 2020-07-24 DIAGNOSIS — D509 Iron deficiency anemia, unspecified: Secondary | ICD-10-CM | POA: Diagnosis not present

## 2020-07-24 DIAGNOSIS — N2581 Secondary hyperparathyroidism of renal origin: Secondary | ICD-10-CM | POA: Diagnosis not present

## 2020-07-24 DIAGNOSIS — N186 End stage renal disease: Secondary | ICD-10-CM | POA: Diagnosis not present

## 2020-07-24 DIAGNOSIS — D631 Anemia in chronic kidney disease: Secondary | ICD-10-CM | POA: Diagnosis not present

## 2020-07-25 ENCOUNTER — Encounter: Payer: Self-pay | Admitting: Neurology

## 2020-07-26 DIAGNOSIS — D631 Anemia in chronic kidney disease: Secondary | ICD-10-CM | POA: Diagnosis not present

## 2020-07-26 DIAGNOSIS — Z992 Dependence on renal dialysis: Secondary | ICD-10-CM | POA: Diagnosis not present

## 2020-07-26 DIAGNOSIS — N2581 Secondary hyperparathyroidism of renal origin: Secondary | ICD-10-CM | POA: Diagnosis not present

## 2020-07-26 DIAGNOSIS — N186 End stage renal disease: Secondary | ICD-10-CM | POA: Diagnosis not present

## 2020-07-26 DIAGNOSIS — D509 Iron deficiency anemia, unspecified: Secondary | ICD-10-CM | POA: Diagnosis not present

## 2020-07-31 DIAGNOSIS — Z992 Dependence on renal dialysis: Secondary | ICD-10-CM | POA: Diagnosis not present

## 2020-07-31 DIAGNOSIS — N186 End stage renal disease: Secondary | ICD-10-CM | POA: Diagnosis not present

## 2020-07-31 DIAGNOSIS — D509 Iron deficiency anemia, unspecified: Secondary | ICD-10-CM | POA: Diagnosis not present

## 2020-07-31 DIAGNOSIS — N2581 Secondary hyperparathyroidism of renal origin: Secondary | ICD-10-CM | POA: Diagnosis not present

## 2020-07-31 DIAGNOSIS — D631 Anemia in chronic kidney disease: Secondary | ICD-10-CM | POA: Diagnosis not present

## 2020-08-02 DIAGNOSIS — N2581 Secondary hyperparathyroidism of renal origin: Secondary | ICD-10-CM | POA: Diagnosis not present

## 2020-08-02 DIAGNOSIS — N186 End stage renal disease: Secondary | ICD-10-CM | POA: Diagnosis not present

## 2020-08-02 DIAGNOSIS — D509 Iron deficiency anemia, unspecified: Secondary | ICD-10-CM | POA: Diagnosis not present

## 2020-08-02 DIAGNOSIS — Z992 Dependence on renal dialysis: Secondary | ICD-10-CM | POA: Diagnosis not present

## 2020-08-02 DIAGNOSIS — D631 Anemia in chronic kidney disease: Secondary | ICD-10-CM | POA: Diagnosis not present

## 2020-08-03 ENCOUNTER — Encounter: Payer: Self-pay | Admitting: Family Medicine

## 2020-08-03 ENCOUNTER — Ambulatory Visit (INDEPENDENT_AMBULATORY_CARE_PROVIDER_SITE_OTHER): Payer: Medicare Other | Admitting: Family Medicine

## 2020-08-03 ENCOUNTER — Other Ambulatory Visit: Payer: Self-pay

## 2020-08-03 VITALS — BP 142/68 | HR 59 | Temp 97.9°F | Wt 130.0 lb

## 2020-08-03 DIAGNOSIS — D631 Anemia in chronic kidney disease: Secondary | ICD-10-CM

## 2020-08-03 DIAGNOSIS — I10 Essential (primary) hypertension: Secondary | ICD-10-CM | POA: Diagnosis not present

## 2020-08-03 DIAGNOSIS — N185 Chronic kidney disease, stage 5: Secondary | ICD-10-CM

## 2020-08-03 DIAGNOSIS — I25118 Atherosclerotic heart disease of native coronary artery with other forms of angina pectoris: Secondary | ICD-10-CM | POA: Diagnosis not present

## 2020-08-03 DIAGNOSIS — S78119A Complete traumatic amputation at level between unspecified hip and knee, initial encounter: Secondary | ICD-10-CM | POA: Diagnosis not present

## 2020-08-03 DIAGNOSIS — N186 End stage renal disease: Secondary | ICD-10-CM

## 2020-08-03 DIAGNOSIS — I5032 Chronic diastolic (congestive) heart failure: Secondary | ICD-10-CM | POA: Diagnosis not present

## 2020-08-03 DIAGNOSIS — E1122 Type 2 diabetes mellitus with diabetic chronic kidney disease: Secondary | ICD-10-CM | POA: Diagnosis not present

## 2020-08-03 MED ORDER — SHINGRIX 50 MCG/0.5ML IM SUSR: 0.5000 mL | Freq: Once | INTRAMUSCULAR | 0 refills | Status: AC

## 2020-08-03 MED ORDER — UNABLE TO FIND: 0 refills | Status: AC

## 2020-08-03 MED ORDER — PANTOPRAZOLE SODIUM 40 MG PO TBEC: 40.0000 mg | DELAYED_RELEASE_TABLET | Freq: Every day | ORAL | 1 refills | Status: AC

## 2020-08-03 NOTE — Progress Notes (Signed)
Subjective     CC:  Chief Complaint  Patient presents with  . Referral    Pt is requesting PCP fill out form to be enrolled with PT at [facility]   . Diarrhea    Currently taking imodium with no relief  . Health Maintenance    Patient is aware she is due for eye exam - medicaid will not cover exam until end of March 2022    HPI: Brandy Houston is a 65 y.o. female who presents to the office today to address the problems listed above in the chief complaint.  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  She has end-stage kidney disease and is on dialysis.  She is here because she is having increased problems with gait, multiple falls due to the need for a new prosthetic.  She would like to be referred to [facility] clinic for help, assessment and physical therapy.  Brandy Houston is a left transfemoral amputee. Her comorbidites do not impact her ability to function with a prosthesis. She currently has a prosthesis that is ill fitting due to 30+ lb weight loss. Brandy Houston verbally communicates a strong desire to get a new prosthetic socket. She is currently using a wheelchair or walker to ambulate, but it is expected that she ambulate more frequently with a proper fitting socket. Brandy Houston is a K2 level ambulator that walk inside and outside her home. She will benefit from a replacement prosthetic socket and supplies.  Reviewed hospital notes from January.  She was admitted again for recurrent GI bleed.  Fortunately, through endoscopy, this has been resolved.  She does have follow-up with GI.  Most recent lab tests have been stable.  She has persistent diarrhea.  She takes Imodium.  Resolved diabetes: Most recent A1c was 4.6.  She is not on diabetes medications.  End-stage renal disease: She is at her dry weight.  Had dialysis yesterday.  Getting her energy back today.  No recent infections.  Hypertension: Fairly well controlled.  Volume  dependent. Assessment  1. Amputation above knee ([place])   2. End stage renal disease ([place])   3. Chronic diastolic CHF (congestive heart failure) ([place])   4. Benign essential HTN   5. DM type 2 causing ESRD ([place])   6. Anemia of chronic renal failure, stage 5 ([name])   7. Coronary artery disease of native artery of native heart with stable angina pectoris ([place])      Plan   Above-knee amputation left with weight loss: Due to multiple problems falls documentation noted above, refer [facility] for physical therapy and new prosthetic socket.  Prescription given.  End-stage renal disease on dialysis: Currently stable.  Diastolic heart failure well compensated  Blood pressure fairly good control.  No change in medications today.  Diabetes, resolved.  Continue to monitor every 6 months.  Anemia of chronic renal disease and history of GI bleed: S/p transfusions.  No angina.  Has follow-up with cardiology  Prescription for Shingrix vaccination given.  Patient to take 2 pharmacy.  Defers mammogram and DEXA at this time.  Will reassess in 6 to 12 months.  Follow up: 6 months for recheck Visit date not found  No orders of the defined types were placed in this encounter.  Meds ordered this encounter  Medications  . Zoster Vaccine Adjuvanted [facility]) injection    Sig: Inject 0.5 mLs into the muscle once for 1 dose. Please give 2nd dose  2-6 months after first dose    Dispense:  2 each    Refill:  0  . pantoprazole (PROTONIX) 40 MG tablet    Sig: Take 1 tablet (40 mg total) by mouth daily. 40 mg BID for 2 months & then once daily    Dispense:  90 tablet    Refill:  1  . UNABLE TO FIND    Sig: Prosthetic socket, from [place]:  1 each    Refill:  0      I reviewed the patients updated PMH, FH, and SocHx.    Patient Active Problem List   Diagnosis Date Noted  . End stage renal disease ([place]) 04/19/2018    Priority: High  . Secondary hyperparathyroidism of renal  origin ([place]) 04/19/2018    Priority: High  . Nicotine dependence, cigarettes, uncomplicated 41/66/0630    Priority: High  . Benign essential HTN     Priority: High  . Amputation above knee ([place])     Priority: High  . DM type 2 causing ESRD ([place]) 03/21/2017    Priority: High  . Anemia in chronic kidney disease 02/05/2017    Priority: High  . Chronic diastolic CHF (congestive heart failure) ([place]) 11/08/2015    Priority: High  . Diabetic peripheral neuropathy ([name])     Priority: High  . Chronic kidney disease (CKD), stage IV (severe) ([name])     Priority: High  . Anemia, blood loss     Priority: Medium  . Major depression, recurrent, chronic ([name])     Priority: Medium  . Hepatitis C 08/16/2014    Priority: Medium  . Esophageal candidiasis ([place]) 06/25/2020  . Intestinal angiodysplasia   . GIB (gastrointestinal bleeding) 06/21/2020  . Lab test positive for detection of COVID-19 virus 06/21/2020  . Rectal bleeding 06/20/2020  . Coronary artery disease of native artery of native heart with stable angina pectoris ([place]) 06/13/2020  . Nonrheumatic aortic valve insufficiency 06/13/2020  . Nonrheumatic tricuspid valve regurgitation 06/13/2020  . GAVE (gastric antral vascular ectasia)   . AVM (arteriovenous malformation) of small bowel, acquired with hemorrhage   . Hip swelling, right 05/27/2020  . Hypokalemia 05/27/2020  . Anemia of chronic renal failure 05/27/2020  . 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 Acute combined systolic and diastolic heart failure ([place])   . Unstable angina ([place])   . Symptomatic anemia 01/27/2020  . Hypercalcemia 10/06/2019  . Moderate protein-calorie malnutrition ([place]) 05/11/2018  . Iron deficiency anemia, unspecified 04/19/2018  . Nephrotic syndrome with unspecified morphologic changes 04/19/2018  . Peripheral neuropathy 06/24/2013   Current Meds  Medication Sig  . Accu-Chek Softclix Lancets lancets Check sugars three times  a day for E11.65  . acetaminophen (TYLENOL) 500 MG tablet Take 500-1,000 mg by mouth every 6 (six) hours as needed (for pain.).  [name] ascorbic acid (VITAMIN C) 500 MG tablet Take 1 tablet (500 mg total) by mouth daily.  [name] atorvastatin (LIPITOR) 10 MG tablet Take 1 tablet (10 mg total) by mouth at bedtime.  . blood glucose meter kit and supplies KIT Dispense based on patient and insurance preference. Use up to four times daily as directed  . Blood Glucose Monitoring Suppl (ACCU-CHEK GUIDE) w/Device KIT 1 Device by Does not apply route daily.  . calcium acetate (PHOSLO) 667 MG capsule Take 1,334-2,001 mg by mouth See admin instructions. Take 3 capsules (2001 mg) by mouth with breakfast, take 2 capsules (1334 mg) by mouth  with lunch, take 3 capsules (2001 mg) by mouth with supper & take 2 capsules (1334 mg) by mouth with snacks.  . carvedilol (COREG) 3.125 MG tablet Take 1 tablet (3.125 mg total) by mouth 2 (two) times daily.  . cloNIDine (CATAPRES) 0.1 MG tablet Take 0.1 mg by mouth See admin instructions. Bid on non-dialysis days( m,w,f,sun)  . diltiazem 2 % GEL Apply 1 application topically 3 (three) times daily. Apply to affected area on anus  . diphenhydrAMINE (BENADRYL) 25 mg capsule Take 25 mg by mouth every 6 (six) hours as needed for allergies.  [name] gabapentin (NEURONTIN) 100 MG capsule Take 200 mg by mouth See admin instructions. Take 2 capsules (200 mg) by mouth 3 times on Sundays, Mondays, Wednesdays, & Fridays.  [name] gabapentin (NEURONTIN) 300 MG capsule Take 300 mg by mouth See admin instructions. Take 1 capsule (300 mg) by mouth twice daily on Tuesdays, Thursdays, & Saturdays.  [name] glucose blood (ACCU-CHEK GUIDE) test strip Check blood sugar up to 4 times a day  . hydrALAZINE (APRESOLINE) 100 MG tablet Take 100 mg by mouth See admin instructions. Bid on non-dialysis days.( m,w,f,sun)  . hydrocortisone (ANUSOL-HC) 25 MG suppository Place 1 suppository (25 mg total) rectally 2 (two) times daily.  .  Iron, Ferrous Sulfate, 325 (65 Fe) MG TABS Take 325 mg by mouth daily. Note that this medication will turn stool black and may cause constipation.  . isosorbide mononitrate (IMDUR) 30 MG 24 hr tablet Take 1 tablet (30 mg total) by mouth daily.  [name] [facility]) lancets Check sugars TID for E11.65  . lidocaine-prilocaine (EMLA) cream Apply 1 application topically Every Tuesday,Thursday,and Saturday with dialysis. On days of dialysis 3x week  . methocarbamol (ROBAXIN) 500 MG tablet Take 500 mg by mouth every 8 (eight) hours as needed for muscle spasms.   . misoprostol (CYTOTEC) 100 MCG tablet TAKE 1 TABLET BY MOUTH EVERY 6 HOURS  . multivitamin (RENA-VIT) TABS tablet Take 1 tablet by mouth daily.  . nitroGLYCERIN (NITROSTAT) 0.4 MG SL tablet Place 1 tablet (0.4 mg total) under the tongue every 5 (five) minutes as needed.  . polyethylene glycol (MIRALAX / GLYCOLAX) 17 g packet Take 17 g by mouth daily as needed for moderate constipation.  . sertraline (ZOLOFT) 100 MG tablet Take 2 tablets (200 mg total) by mouth daily.  . traZODone (DESYREL) 50 MG tablet Take 50 mg by mouth at bedtime.  [name] UNABLE TO FIND Prosthetic socket, from [facility]  . Zoster Vaccine Adjuvanted [facility]) injection Inject 0.5 mLs into the muscle once for 1 dose. Please give 2nd dose 2-6 months after first dose  . [DISCONTINUED] pantoprazole (PROTONIX) 40 MG tablet Take 1 tablet (40 mg total) by mouth 2 (two) times daily. 40 mg BID for 2 months & then once daily  . [DISCONTINUED] Zinc Sulfate 220 (50 Zn) MG TABS Take 1 tablet (220 mg total) by mouth daily.    Allergies: Patient is allergic to compazine [prochlorperazine], shellfish-derived products, iodinated diagnostic agents, omnipaque [iohexol], tape, and sulfa antibiotics. Family History: Patient family history includes Cancer in her brother; Diabetes in her brother; Heart disease in her father; Hypertension in her brother and sister; Uterine cancer  in her mother and sister. Social History:  Patient  reports that she has been smoking cigarettes. She has a 17.50 pack-year smoking history. She has never used smokeless tobacco. She reports previous alcohol use. She reports that she does not use drugs.  Review of Systems: Constitutional: Negative  for fever malaise or anorexia Cardiovascular: negative for chest pain Respiratory: negative for SOB or persistent cough Gastrointestinal: negative for abdominal pain  Objective  Vitals: BP (!) 142/68   Pulse (!) 59   Temp 97.9 F (36.6 C) (Temporal)   Wt 130 lb (59 kg)   LMP 03/16/2002   SpO2 97%   BMI 20.36 kg/m  General: no acute distress , A&Ox3, thin, sitting in wheelchair HEENT: PEERL, conjunctiva normal, neck is supple Cardiovascular:  RRR mild systolic murmur Respiratory:  Good breath sounds bilaterally, CTAB with normal respiratory effort Skin:  Warm, no rashes  No visits with results within 1 Day(s) from this visit.  Latest known visit with results is:  Admission on 06/20/2020, Discharged on 06/25/2020  Component Date Value Ref Range Status  . Sodium 06/20/2020 135  135 - 145 mmol/L Final  . Potassium 06/20/2020 3.6  3.5 - 5.1 mmol/L Final  . Chloride 06/20/2020 95* 98 - 111 mmol/L Final  . CO2 06/20/2020 28  22 - 32 mmol/L Final  . Glucose, Bld 06/20/2020 112* 70 - 99 mg/dL Final  . BUN 06/20/2020 26* 8 - 23 mg/dL Final  . Creatinine, Ser 06/20/2020 3.92* 0.44 - 1.00 mg/dL Final  . Calcium 06/20/2020 7.6* 8.9 - 10.3 mg/dL Final  . Total Protein 06/20/2020 4.8* 6.5 - 8.1 g/dL Final  . Albumin 06/20/2020 2.3* 3.5 - 5.0 g/dL Final  . AST 06/20/2020 16  15 - 41 U/L Final  . ALT 06/20/2020 16  0 - 44 U/L Final  . Alkaline Phosphatase 06/20/2020 57  38 - 126 U/L Final  . Total Bilirubin 06/20/2020 0.8  0.3 - 1.2 mg/dL Final  . GFR, Estimated 06/20/2020 12* >60 mL/min Final  . Anion gap 06/20/2020 12  5 - 15 Final  . WBC 06/20/2020 4.0  4.0 - 10.5 K/uL Final  . RBC  06/20/2020 1.84* 3.87 - 5.11 MIL/uL Final  . Hemoglobin 06/20/2020 5.5* 12.0 - 15.0 g/dL Corrected  . HCT 06/20/2020 17.7* 36.0 - 46.0 % Final  . MCV 06/20/2020 96.2  80.0 - 100.0 fL Final  . MCH 06/20/2020 29.9  26.0 - 34.0 pg Final  . MCHC 06/20/2020 31.1  30.0 - 36.0 g/dL Final  . RDW 06/20/2020 17.7* 11.5 - 15.5 % Final  . Platelets 06/20/2020 191  150 - 400 K/uL Final  . nRBC 06/20/2020 0.0  0.0 - 0.2 % Final  . ABO/RH(D) 06/20/2020 A POS   Final  . Antibody Screen 06/20/2020 NEG   Final  . Sample Expiration 06/20/2020 06/23/2020,2359   Final  . Unit Number 06/20/2020 [id number]   Final  . Blood Component Type 06/20/2020 RED CELLS,LR   Final  . Unit division 06/20/2020 00   Final  . Status of Unit 06/20/2020 ISSUED,FINAL   Final  . Transfusion Status 06/20/2020 OK TO TRANSFUSE   Final  . Crossmatch Result 06/20/2020    Final                   Value:Compatible Performed at [facility], [place] [address] [place], [place] [address]   . Unit Number 06/20/2020 [id number]   Final  . Blood Component Type 06/20/2020 RED CELLS,LR   Final  . Unit division 06/20/2020 00   Final  . Status of Unit 06/20/2020 ISSUED,FINAL   Final  . Transfusion Status 06/20/2020 OK TO TRANSFUSE   Final  . Crossmatch Result 06/20/2020 Compatible   Final  . Fecal Occult Bld 06/20/2020 POSITIVE* NEGATIVE Final  .  Order Confirmation 06/20/2020    Final                   Value:ORDER PROCESSED BY BLOOD BANK Performed at [facility], [place] [address], [place], [place] [address]   . Vitamin B-12 06/20/2020 1,703* 180 - 914 pg/mL Final  . Folate 06/20/2020 62.6  >5.9 ng/mL Final  . Iron 06/20/2020 27* 28 - 170 ug/dL Final  . TIBC 06/20/2020 193* 250 - 450 ug/dL Final  . Saturation Ratios 06/20/2020 14  10.4 - 31.8 % Final  . UIBC 06/20/2020 166  ug/dL Final  . Ferritin 06/20/2020 896* 11 - 307 ng/mL Final  . Retic Ct Pct 06/20/2020 3.5* 0.4 - 3.1 % Final  . RBC. 06/20/2020 2.53* 3.87 - 5.11  MIL/uL Final  . Retic Count, Absolute 06/20/2020 88.8  19.0 - 186.0 K/uL Final  . Immature Retic Fract 06/20/2020 28.1* 2.3 - 15.9 % Final  . ISSUE DATE / TIME 06/20/2020 [id number]   Final  . Blood Product Unit Number 06/20/2020 [id number]   Final  . PRODUCT CODE 06/20/2020 [id number]   Final  . Unit Type and Rh 06/20/2020 6200   Final  . Blood Product Expiration Date 06/20/2020 [id number]   Final  . ISSUE DATE / TIME 06/20/2020 [id number]   Final  . Blood Product Unit Number 06/20/2020 [id number]   Final  . PRODUCT CODE 06/20/2020 [id number]   Final  . Unit Type and Rh 06/20/2020 6200   Final  . Blood Product Expiration Date 06/20/2020 [id number]   Final  . SARS Coronavirus 2 06/20/2020 POSITIVE* NEGATIVE Final  . Hgb A1c MFr Bld 06/21/2020 4.8  4.8 - 5.6 % Final  . Mean Plasma Glucose 06/21/2020 91.06  mg/dL Final  . Sodium 06/21/2020 136  135 - 145 mmol/L Final  . Potassium 06/21/2020 4.1  3.5 - 5.1 mmol/L Final  . Chloride 06/21/2020 98  98 - 111 mmol/L Final  . CO2 06/21/2020 25  22 - 32 mmol/L Final  . Glucose, Bld 06/21/2020 89  70 - 99 mg/dL Final  . BUN 06/21/2020 35* 8 - 23 mg/dL Final  . Creatinine, Ser 06/21/2020 4.71* 0.44 - 1.00 mg/dL Final  . Calcium 06/21/2020 7.7* 8.9 - 10.3 mg/dL Final  . Total Protein 06/21/2020 5.1* 6.5 - 8.1 g/dL Final  . Albumin 06/21/2020 2.3* 3.5 - 5.0 g/dL Final  . AST 06/21/2020 19  15 - 41 U/L Final  . ALT 06/21/2020 15  0 - 44 U/L Final  . Alkaline Phosphatase 06/21/2020 57  38 - 126 U/L Final  . Total Bilirubin 06/21/2020 0.8  0.3 - 1.2 mg/dL Final  . GFR, Estimated 06/21/2020 10* >60 mL/min Final  . Anion gap 06/21/2020 13  5 - 15 Final  . WBC 06/21/2020 4.7  4.0 - 10.5 K/uL Final  . RBC 06/21/2020 2.77* 3.87 - 5.11 MIL/uL Final  . Hemoglobin 06/21/2020 8.2* 12.0 - 15.0 g/dL Final  . HCT 06/21/2020 25.8* 36.0 - 46.0 % Final  . MCV 06/21/2020 93.1  80.0 - 100.0 fL Final  . MCH 06/21/2020 29.6  26.0 - 34.0 pg Final  . MCHC  06/21/2020 31.8  30.0 - 36.0 g/dL Final  . RDW 06/21/2020 17.6* 11.5 - 15.5 % Final  . Platelets 06/21/2020 174  150 - 400 K/uL Final  . nRBC 06/21/2020 0.0  0.0 - 0.2 % Final  . Glucose-Capillary 06/20/2020 89  70 - 99 mg/dL Final  . Glucose-Capillary 06/21/2020 145* 70 -  99 mg/dL Final  . Glucose-Capillary 06/21/2020 106* 70 - 99 mg/dL Final  . WBC 06/22/2020 5.2  4.0 - 10.5 K/uL Final  . RBC 06/22/2020 2.76* 3.87 - 5.11 MIL/uL Final  . Hemoglobin 06/22/2020 8.3* 12.0 - 15.0 g/dL Final  . HCT 06/22/2020 25.2* 36.0 - 46.0 % Final  . MCV 06/22/2020 91.3  80.0 - 100.0 fL Final  . MCH 06/22/2020 30.1  26.0 - 34.0 pg Final  . MCHC 06/22/2020 32.9  30.0 - 36.0 g/dL Final  . RDW 06/22/2020 17.6* 11.5 - 15.5 % Final  . Platelets 06/22/2020 197  150 - 400 K/uL Final  . nRBC 06/22/2020 0.0  0.0 - 0.2 % Final  . Sodium 06/22/2020 136  135 - 145 mmol/L Final  . Potassium 06/22/2020 3.8  3.5 - 5.1 mmol/L Final  . Chloride 06/22/2020 98  98 - 111 mmol/L Final  . CO2 06/22/2020 23  22 - 32 mmol/L Final  . Glucose, Bld 06/22/2020 106* 70 - 99 mg/dL Final  . BUN 06/22/2020 48* 8 - 23 mg/dL Final  . Creatinine, Ser 06/22/2020 5.80* 0.44 - 1.00 mg/dL Final  . Calcium 06/22/2020 8.1* 8.9 - 10.3 mg/dL Final  . Phosphorus 06/22/2020 4.5  2.5 - 4.6 mg/dL Final  . Albumin 06/22/2020 2.2* 3.5 - 5.0 g/dL Final  . GFR, Estimated 06/22/2020 8* >60 mL/min Final  . Anion gap 06/22/2020 15  5 - 15 Final  . Glucose-Capillary 06/21/2020 100* 70 - 99 mg/dL Final  . WBC 06/21/2020 5.4  4.0 - 10.5 K/uL Final  . RBC 06/21/2020 2.99* 3.87 - 5.11 MIL/uL Final  . Hemoglobin 06/21/2020 8.8* 12.0 - 15.0 g/dL Final  . HCT 06/21/2020 27.3* 36.0 - 46.0 % Final  . MCV 06/21/2020 91.3  80.0 - 100.0 fL Final  . MCH 06/21/2020 29.4  26.0 - 34.0 pg Final  . MCHC 06/21/2020 32.2  30.0 - 36.0 g/dL Final  . RDW 06/21/2020 17.5* 11.5 - 15.5 % Final  . Platelets 06/21/2020 207  150 - 400 K/uL Final  . nRBC 06/21/2020 0.0  0.0 -  0.2 % Final  . Glucose-Capillary 06/21/2020 104* 70 - 99 mg/dL Final  . Glucose-Capillary 06/22/2020 102* 70 - 99 mg/dL Final  . Glucose-Capillary 06/22/2020 129* 70 - 99 mg/dL Final  . Glucose-Capillary 06/22/2020 160* 70 - 99 mg/dL Final  . WBC 06/23/2020 4.6  4.0 - 10.5 K/uL Final  . RBC 06/23/2020 2.48* 3.87 - 5.11 MIL/uL Final  . Hemoglobin 06/23/2020 7.1* 12.0 - 15.0 g/dL Final  . HCT 06/23/2020 23.5* 36.0 - 46.0 % Final  . MCV 06/23/2020 94.8  80.0 - 100.0 fL Final  . MCH 06/23/2020 28.6  26.0 - 34.0 pg Final  . MCHC 06/23/2020 30.2  30.0 - 36.0 g/dL Final  . RDW 06/23/2020 17.5* 11.5 - 15.5 % Final  . Platelets 06/23/2020 167  150 - 400 K/uL Final  . nRBC 06/23/2020 0.0  0.0 - 0.2 % Final  . Glucose-Capillary 06/22/2020 90  70 - 99 mg/dL Final  . WBC 06/23/2020 4.8  4.0 - 10.5 K/uL Final  . RBC 06/23/2020 2.74* 3.87 - 5.11 MIL/uL Final  . Hemoglobin 06/23/2020 8.1* 12.0 - 15.0 g/dL Final  . HCT 06/23/2020 26.6* 36.0 - 46.0 % Final  . MCV 06/23/2020 97.1  80.0 - 100.0 fL Final  . MCH 06/23/2020 29.6  26.0 - 34.0 pg Final  . MCHC 06/23/2020 30.5  30.0 - 36.0 g/dL Final  . RDW 06/23/2020 17.6* 11.5 -  15.5 % Final  . Platelets 06/23/2020 185  150 - 400 K/uL Final  . nRBC 06/23/2020 0.0  0.0 - 0.2 % Final  . Glucose-Capillary 06/23/2020 98  70 - 99 mg/dL Final  . Sodium 06/23/2020 137  135 - 145 mmol/L Final  . Potassium 06/23/2020 3.6  3.5 - 5.1 mmol/L Final  . Chloride 06/23/2020 99  98 - 111 mmol/L Final  . CO2 06/23/2020 25  22 - 32 mmol/L Final  . Glucose, Bld 06/23/2020 147* 70 - 99 mg/dL Final  . BUN 06/23/2020 29* 8 - 23 mg/dL Final  . Creatinine, Ser 06/23/2020 4.45* 0.44 - 1.00 mg/dL Final  . Calcium 06/23/2020 7.8* 8.9 - 10.3 mg/dL Final  . Phosphorus 06/23/2020 2.5  2.5 - 4.6 mg/dL Final  . Albumin 06/23/2020 2.1* 3.5 - 5.0 g/dL Final  . GFR, Estimated 06/23/2020 10* >60 mL/min Final  . Anion gap 06/23/2020 13  5 - 15 Final  . Glucose-Capillary 06/23/2020 139* 70  - 99 mg/dL Final  . Glucose-Capillary 06/23/2020 129* 70 - 99 mg/dL Final  . WBC 06/24/2020 4.8  4.0 - 10.5 K/uL Final  . RBC 06/24/2020 2.78* 3.87 - 5.11 MIL/uL Final  . Hemoglobin 06/24/2020 8.2* 12.0 - 15.0 g/dL Final  . HCT 06/24/2020 26.4* 36.0 - 46.0 % Final  . MCV 06/24/2020 95.0  80.0 - 100.0 fL Final  . MCH 06/24/2020 29.5  26.0 - 34.0 pg Final  . MCHC 06/24/2020 31.1  30.0 - 36.0 g/dL Final  . RDW 06/24/2020 16.9* 11.5 - 15.5 % Final  . Platelets 06/24/2020 183  150 - 400 K/uL Final  . nRBC 06/24/2020 0.0  0.0 - 0.2 % Final  . Glucose-Capillary 06/23/2020 124* 70 - 99 mg/dL Final  . Glucose-Capillary 06/24/2020 86  70 - 99 mg/dL Final  . Glucose-Capillary 06/24/2020 161* 70 - 99 mg/dL Final  . Glucose-Capillary 06/24/2020 100* 70 - 99 mg/dL Final  . WBC 06/25/2020 5.0  4.0 - 10.5 K/uL Final  . RBC 06/25/2020 2.58* 3.87 - 5.11 MIL/uL Final  . Hemoglobin 06/25/2020 7.8* 12.0 - 15.0 g/dL Final  . HCT 06/25/2020 23.7* 36.0 - 46.0 % Final  . MCV 06/25/2020 91.9  80.0 - 100.0 fL Final  . MCH 06/25/2020 30.2  26.0 - 34.0 pg Final  . MCHC 06/25/2020 32.9  30.0 - 36.0 g/dL Final  . RDW 06/25/2020 16.2* 11.5 - 15.5 % Final  . Platelets 06/25/2020 165  150 - 400 K/uL Final  . nRBC 06/25/2020 0.0  0.0 - 0.2 % Final  . Glucose-Capillary 06/24/2020 92  70 - 99 mg/dL Final  . Glucose-Capillary 06/25/2020 83  70 - 99 mg/dL Final  . Glucose-Capillary 06/25/2020 80  70 - 99 mg/dL Final  . Glucose-Capillary 06/25/2020 96  70 - 99 mg/dL Final      Commons side effects, risks, benefits, and alternatives for medications and treatment plan prescribed today were discussed, and the patient expressed understanding of the given instructions. Patient is instructed to call or message via MyChart if he/she has any questions or concerns regarding our treatment plan. No barriers to understanding were identified. We discussed Red Flag symptoms and signs in detail. Patient expressed understanding regarding  what to do in case of urgent or emergency type symptoms.   Medication list was reconciled, printed and provided to the patient in AVS. Patient instructions and summary information was reviewed with the patient as documented in the AVS. This note was prepared with assistance of Dragon voice  recognition software. Occasional wrong-word or sound-a-like substitutions may have occurred due to the inherent limitations of voice recognition software  This visit occurred during the SARS-CoV-2 public health emergency.  Safety protocols were in place, including screening questions prior to the visit, additional usage of staff PPE, and extensive cleaning of exam room while observing appropriate contact time as indicated for disinfecting solutions.

## 2020-08-03 NOTE — Patient Instructions (Signed)
Please return in 6 months for recheck.   We will fax in paperwork to the [facility].  Please take the prescription for Shingrix to the pharmacy so they may administer the vaccinations. Your insurance will then cover the injections.   If you have any questions or concerns, please don't hesitate to send me a message via MyChart or call the office at [phone]. Thank you for visiting with [place] today! It's our pleasure caring for you.

## 2020-08-04 DIAGNOSIS — N2581 Secondary hyperparathyroidism of renal origin: Secondary | ICD-10-CM | POA: Diagnosis not present

## 2020-08-04 DIAGNOSIS — Z992 Dependence on renal dialysis: Secondary | ICD-10-CM | POA: Diagnosis not present

## 2020-08-04 DIAGNOSIS — N186 End stage renal disease: Secondary | ICD-10-CM | POA: Diagnosis not present

## 2020-08-04 DIAGNOSIS — D631 Anemia in chronic kidney disease: Secondary | ICD-10-CM | POA: Diagnosis not present

## 2020-08-04 DIAGNOSIS — D509 Iron deficiency anemia, unspecified: Secondary | ICD-10-CM | POA: Diagnosis not present

## 2020-08-07 DIAGNOSIS — D509 Iron deficiency anemia, unspecified: Secondary | ICD-10-CM | POA: Diagnosis not present

## 2020-08-07 DIAGNOSIS — D631 Anemia in chronic kidney disease: Secondary | ICD-10-CM | POA: Diagnosis not present

## 2020-08-07 DIAGNOSIS — N2581 Secondary hyperparathyroidism of renal origin: Secondary | ICD-10-CM | POA: Diagnosis not present

## 2020-08-07 DIAGNOSIS — N186 End stage renal disease: Secondary | ICD-10-CM | POA: Diagnosis not present

## 2020-08-07 DIAGNOSIS — Z992 Dependence on renal dialysis: Secondary | ICD-10-CM | POA: Diagnosis not present

## 2020-08-08 DIAGNOSIS — T82858A Stenosis of vascular prosthetic devices, implants and grafts, initial encounter: Secondary | ICD-10-CM | POA: Diagnosis not present

## 2020-08-08 DIAGNOSIS — I871 Compression of vein: Secondary | ICD-10-CM | POA: Diagnosis not present

## 2020-08-08 DIAGNOSIS — N186 End stage renal disease: Secondary | ICD-10-CM | POA: Diagnosis not present

## 2020-08-08 DIAGNOSIS — Z992 Dependence on renal dialysis: Secondary | ICD-10-CM | POA: Diagnosis not present

## 2020-08-09 DIAGNOSIS — D509 Iron deficiency anemia, unspecified: Secondary | ICD-10-CM | POA: Diagnosis not present

## 2020-08-09 DIAGNOSIS — N186 End stage renal disease: Secondary | ICD-10-CM | POA: Diagnosis not present

## 2020-08-09 DIAGNOSIS — D631 Anemia in chronic kidney disease: Secondary | ICD-10-CM | POA: Diagnosis not present

## 2020-08-09 DIAGNOSIS — N2581 Secondary hyperparathyroidism of renal origin: Secondary | ICD-10-CM | POA: Diagnosis not present

## 2020-08-09 DIAGNOSIS — Z992 Dependence on renal dialysis: Secondary | ICD-10-CM | POA: Diagnosis not present

## 2020-08-11 DIAGNOSIS — D631 Anemia in chronic kidney disease: Secondary | ICD-10-CM | POA: Diagnosis not present

## 2020-08-11 DIAGNOSIS — Z992 Dependence on renal dialysis: Secondary | ICD-10-CM | POA: Diagnosis not present

## 2020-08-11 DIAGNOSIS — N186 End stage renal disease: Secondary | ICD-10-CM | POA: Diagnosis not present

## 2020-08-11 DIAGNOSIS — N2581 Secondary hyperparathyroidism of renal origin: Secondary | ICD-10-CM | POA: Diagnosis not present

## 2020-08-11 DIAGNOSIS — D509 Iron deficiency anemia, unspecified: Secondary | ICD-10-CM | POA: Diagnosis not present

## 2020-08-14 DIAGNOSIS — Z992 Dependence on renal dialysis: Secondary | ICD-10-CM | POA: Diagnosis not present

## 2020-08-14 DIAGNOSIS — N2581 Secondary hyperparathyroidism of renal origin: Secondary | ICD-10-CM | POA: Diagnosis not present

## 2020-08-14 DIAGNOSIS — D509 Iron deficiency anemia, unspecified: Secondary | ICD-10-CM | POA: Diagnosis not present

## 2020-08-14 DIAGNOSIS — E1129 Type 2 diabetes mellitus with other diabetic kidney complication: Secondary | ICD-10-CM | POA: Diagnosis not present

## 2020-08-14 DIAGNOSIS — N186 End stage renal disease: Secondary | ICD-10-CM | POA: Diagnosis not present

## 2020-08-16 DIAGNOSIS — N186 End stage renal disease: Secondary | ICD-10-CM | POA: Diagnosis not present

## 2020-08-16 DIAGNOSIS — Z992 Dependence on renal dialysis: Secondary | ICD-10-CM | POA: Diagnosis not present

## 2020-08-16 DIAGNOSIS — N2581 Secondary hyperparathyroidism of renal origin: Secondary | ICD-10-CM | POA: Diagnosis not present

## 2020-08-16 DIAGNOSIS — D509 Iron deficiency anemia, unspecified: Secondary | ICD-10-CM | POA: Diagnosis not present

## 2020-08-18 DIAGNOSIS — D509 Iron deficiency anemia, unspecified: Secondary | ICD-10-CM | POA: Diagnosis not present

## 2020-08-18 DIAGNOSIS — N186 End stage renal disease: Secondary | ICD-10-CM | POA: Diagnosis not present

## 2020-08-18 DIAGNOSIS — Z992 Dependence on renal dialysis: Secondary | ICD-10-CM | POA: Diagnosis not present

## 2020-08-18 DIAGNOSIS — N2581 Secondary hyperparathyroidism of renal origin: Secondary | ICD-10-CM | POA: Diagnosis not present

## 2020-08-19 NOTE — Progress Notes (Signed)
Cardiology Office Note:    Date:  08/22/2020   ID:  Brandy, Houston 1956/06/06, MRN [id number]  PCP:  [name], MD  Cardiologist:  Donato Heinz, MD  Electrophysiologist:  None   Referring MD: [name], MD   Chief Complaint  Patient presents with  . Chest Pain        History of Present Illness:    Brandy Houston is a 65 y.o. female with a hx of multivessel CAD, HFrEF, ESRD on HD, type 2 diabetes, moderate aortic regurgitation, hypertension, anemia, septic arthritis s/p L AKA who presents for follow-up.  She was referred by Dr. Jimmy Footman for an evaluation of chest pain on 04/04/2019.  She presented to the ED on 11/27/2018 with chest pain.  Work-up was unremarkable but was notable for low hemoglobin (7.1).  Given her symptoms, Carlton Adam Myoview was done on 04/15/2019, which showed small fixed defect at apex, otherwise no abnormalities.  TTE on 04/13/2019 showed mild aortic stenosis, moderate aortic regurgitation, normal EF, grade 2 diastolic dysfunction, normal RV function.  Zio patch x7 days on 02/01/2020 showed one 4 beat run of NSVT, otherwise unremarkable.  Repeat echo on 03/28/2020 was done to follow-up her aortic regurgitation, which showed new LV systolic dysfunction (EF 30 to 35%, global hypokinesis), moderate LVH, grade 2 diastolic dysfunction, normal RV function, moderate RV enlargement, severe elevation in RVSP (74 mmHg), moderate pericardial effusion, moderate biatrial dilatation, mild to moderate mitral regurgitation, severe tricuspid regurgitation, moderate aortic regurgitation.  Cardiac catheterization on 04/13/2020 showed 99% proximal to mid LCx stenosis, 50% proximal RCA, 70% ostial to proximal LAD, 60% distal LCx, 90% ramus.  RHC showed elevated pressures (RA 15, RV 54/6, PA 59/24, PW 25, LVEDP 22).  She was evaluated by cardiac surgery and felt not to be a candidate for CABG.  She was also evaluated by interventional cardiology and recommended medical  management.  Repeat echocardiogram on 05/09/2020 showed LVEF 40 to 44%, grade 2 diastolic dysfunction, normal RV function, RVSP 77 mmHg, moderate TR, mild to moderate AI, moderate AS, moderate pericardial effusion with no evidence of tamponade.  Since last clinic visit, she was admitted to [facility] from 1/5 through 06/25/2020 with GI bleeding.  Underwent small bowel enteroscopy which showed 2 nonbleeding AVMs were ablated.  She required 2 units PRBCs during admission.  She reports she has been having occasional chest pain, describes as sharp pain on left side of her chest.  Last for few minutes and resolves.  Occurs about every other day.  She has had to use nitroglycerin 1 time.  She has not been feeling short of breath.  She has been having diarrhea for the past 3 months, has not noted any recent bleeding.  She reports that she has been feeling lightheaded but denies any syncope.   Wt Readings from Last 3 Encounters:  08/22/20 126 lb 9.6 oz (57.4 kg)  08/03/20 130 lb (59 kg)  06/25/20 119 lb 14.9 oz (54.4 kg)       Past Medical History:  Diagnosis Date  . Allergy   . Anemia   . Anxiety   . Arthritis    "in my joints" (03/10/2017)  . Chronic diastolic CHF (congestive heart failure) ([place])   . CKD (chronic kidney disease), stage IV ([place])    stage IV. previous HD, none currently 10/30/15 (confirmed 02/05/2017 & 03/10/2017)  . Depression    Chronic  . Diabetic peripheral neuropathy ([place])   . Dysrhythmia    tachycardia,  normal ECHO 08-09-14  . GERD (gastroesophageal reflux disease)   . Heart murmur     dx'd 02/05/2017  . Hepatitis C    "tx'd in 2016; I'm negative now" (02/05/2017)  . History of blood transfusion 2017   "w/knee replacement"  . Hyperlipidemia   . Hypertension   . Osteoarthritis of left knee   . Peripheral neuropathy   . Protein calorie malnutrition ([place])   . Septic arthritis ([place])   . Slow transit constipation   . Symptomatic anemia 01/27/2020  . Type II diabetes mellitus  ([name])    IDDM  . Uncontrolled hypertension 02/18/2013  . Unsteady gait     Past Surgical History:  Procedure Laterality Date  . A/V FISTULAGRAM Right 07/07/2018   Procedure: A/V FISTULAGRAM;  Surgeon: Marty Heck, MD;  Location: [place] [facility];  Service: Cardiovascular;  Laterality: Right;  . AMPUTATION Left 03/20/2017   Procedure: LEFT ABOVE KNEE AMPUTATION;  Surgeon: Newt Minion, MD;  Location: [place];  Service: Orthopedics;  Laterality: Left;  . APPENDECTOMY    . AV FISTULA PLACEMENT Left 09/13/2014   Procedure: Brachial Artery to Brachial Vein Gortex Four - Seven Stretch GRAFT INSERTION Left Forearm;  Surgeon: Mal Misty, MD;  Location: [place];  Service: Vascular;  Laterality: Left;  . BASCILIC VEIN TRANSPOSITION Right 10/26/2017   Procedure: BASILIC VEIN TRANSPOSITION FIRST STAGE RIGHT ARM;  Surgeon: Conrad , MD;  Location: [place];  Service: Vascular;  Laterality: Right;  . BASCILIC VEIN TRANSPOSITION Right 02/01/2018   Procedure: SECOND STAGE BASILIC VEIN TRANSPOSITION RIGHT UPPER EXTREMITY;  Surgeon: Marty Heck, MD;  Location: [place];  Service: Vascular;  Laterality: Right;  . CHOLECYSTECTOMY OPEN    . COLON SURGERY    . COLONOSCOPY    . ENTEROSCOPY N/A 05/28/2020   Procedure: ENTEROSCOPY;  Surgeon: Mauri Pole, MD;  Location: [facility] ENDOSCOPY;  Service: Endoscopy;  Laterality: N/A;  . ENTEROSCOPY N/A 06/25/2020   Procedure: ENTEROSCOPY;  Surgeon: Milus Banister, MD;  Location: [facility] ENDOSCOPY;  Service: Gastroenterology;  Laterality: N/A;  . ESOPHAGOGASTRODUODENOSCOPY (EGD) WITH PROPOFOL N/A 05/14/2020   Procedure: ESOPHAGOGASTRODUODENOSCOPY (EGD) WITH PROPOFOL;  Surgeon: Mauri Pole, MD;  Location: [facility] ENDOSCOPY;  Service: Endoscopy;  Laterality: N/A;  with APC  . EXCISIONAL TOTAL KNEE ARTHROPLASTY WITH ANTIBIOTIC SPACERS Left 02/06/2017   Procedure: Incisional total iknee with antibiotic spacer ;  Surgeon: Leandrew Koyanagi, MD;  Location: [place];   Service: Orthopedics;  Laterality: Left;  . HOT HEMOSTASIS N/A 05/14/2020   Procedure: HOT HEMOSTASIS (ARGON PLASMA COAGULATION/BICAP);  Surgeon: Mauri Pole, MD;  Location: [facility];  Service: Endoscopy;  Laterality: N/A;  . HOT HEMOSTASIS N/A 05/28/2020   Procedure: HOT HEMOSTASIS (ARGON PLASMA COAGULATION/BICAP);  Surgeon: Mauri Pole, MD;  Location: [facility] ENDOSCOPY;  Service: Endoscopy;  Laterality: N/A;  . HOT HEMOSTASIS N/A 06/25/2020   Procedure: HOT HEMOSTASIS (ARGON PLASMA COAGULATION/BICAP);  Surgeon: Milus Banister, MD;  Location: [facility] ENDOSCOPY;  Service: Gastroenterology;  Laterality: N/A;  . INTRAVASCULAR PRESSURE WIRE/FFR STUDY N/A 04/13/2020   Procedure: INTRAVASCULAR PRESSURE WIRE/FFR STUDY;  Surgeon: Burnell Blanks, MD;  Location: [place] [facility];  Service: Cardiovascular;  Laterality: N/A;  . IR FLUORO GUIDE CV LINE RIGHT  02/10/2017  . IR REMOVAL TUN CV CATH W/O FL  04/01/2017  . IR US GUIDE VASC ACCESS RIGHT  02/10/2017  . IRRIGATION AND DEBRIDEMENT KNEE Left 03/12/2017   Procedure: IRRIGATION AND DEBRIDEMENT LEFT KNEE WITH  WOUND VAC APPLICATION;  Surgeon: Leandrew Koyanagi, MD;  Location: [place];  Service: Orthopedics;  Laterality: Left;  . JOINT REPLACEMENT    . KNEE ARTHROSCOPY Right 08/10/2014   Procedure: ARTHROSCOPY I & D KNEE;  Surgeon: Marianna Payment, MD;  Location: [facility];  Service: Orthopedics;  Laterality: Right;  . KNEE ARTHROSCOPY Left 08/11/2014   Procedure: ARTHROSCOPIC WASHOUT LEFT KNEE;  Surgeon: Marianna Payment, MD;  Location: [place];  Service: Orthopedics;  Laterality: Left;  . KNEE ARTHROSCOPY Left 08/19/2014   Procedure: ARTHROSCOPIC WASHOUT LEFT KNEE;  Surgeon: Leandrew Koyanagi, MD;  Location: [place];  Service: Orthopedics;  Laterality: Left;  . KNEE ARTHROSCOPY WITH LATERAL MENISECTOMY Left 04/04/2015   Procedure: AND PARTIAL LATERAL MENISECTOMY;  Surgeon: Leandrew Koyanagi, MD;  Location: [place];  Service: Orthopedics;   Laterality: Left;  . KNEE ARTHROSCOPY WITH MEDIAL MENISECTOMY Left 04/04/2015   Procedure: LEFT KNEE ARTHROSCOPY WITH PARTIAL MEDIAL MENISCECTOMY  AND SYNOVECTOMY;  Surgeon: Leandrew Koyanagi, MD;  Location: [place];  Service: Orthopedics;  Laterality: Left;  . PERIPHERAL VASCULAR BALLOON ANGIOPLASTY  07/07/2018   Procedure: PERIPHERAL VASCULAR BALLOON ANGIOPLASTY;  Surgeon: Marty Heck, MD;  Location: [place] [facility];  Service: Cardiovascular;;  right AV fistula  . POLYPECTOMY    . RIGHT/LEFT HEART CATH AND CORONARY ANGIOGRAPHY N/A 04/13/2020   Procedure: RIGHT/LEFT HEART CATH AND CORONARY ANGIOGRAPHY;  Surgeon: Burnell Blanks, MD;  Location: [place] [facility];  Service: Cardiovascular;  Laterality: N/A;  . SHOULDER ARTHROSCOPY Bilateral 08/10/2014   Procedure: I & D BILATERAL SHOULDERS ;  Surgeon: Marianna Payment, MD;  Location: [facility];  Service: Orthopedics;  Laterality: Bilateral;  . SMALL INTESTINE SURGERY     Due to Small Bowel Obstruction; "fixed it when they did my gallbladder OR"  . TEE WITHOUT CARDIOVERSION N/A 08/14/2014   Procedure: TRANSESOPHAGEAL ECHOCARDIOGRAM (TEE);  Surgeon: Thayer Headings, MD;  Location: [place];  Service: Cardiovascular;  Laterality: N/A;  . TENOSYNOVECTOMY Right 08/11/2014   Procedure: RIGHT WRIST IRRIGATION AND DEBRIDEMENT, TENOSYNOVECTOMY;  Surgeon: Marianna Payment, MD;  Location: [place];  Service: Orthopedics;  Laterality: Right;  . TOTAL KNEE ARTHROPLASTY Left 11/07/2015   Procedure: LEFT TOTAL KNEE ARTHROPLASTY WITH REVISION OF IMPLANTS;  Surgeon: Leandrew Koyanagi, MD;  Location: [place];  Service: Orthopedics;  Laterality: Left;  . TUBAL LIGATION      Current Medications: Current Meds  Medication Sig  . Accu-Chek Softclix Lancets lancets Check sugars three times a day for E11.65  . acetaminophen (TYLENOL) 500 MG tablet Take 500-1,000 mg by mouth every 6 (six) hours as needed (for pain.).  [name] ascorbic acid (VITAMIN  C) 500 MG tablet Take 1 tablet (500 mg total) by mouth daily.  [name] atorvastatin (LIPITOR) 10 MG tablet Take 1 tablet (10 mg total) by mouth at bedtime.  . blood glucose meter kit and supplies KIT Dispense based on patient and insurance preference. Use up to four times daily as directed  . Blood Glucose Monitoring Suppl (ACCU-CHEK GUIDE) w/Device KIT 1 Device by Does not apply route daily.  . calcium acetate (PHOSLO) 667 MG capsule Take 1,334-2,001 mg by mouth See admin instructions. Take 3 capsules (2001 mg) by mouth with breakfast, take 2 capsules (1334 mg) by mouth with lunch, take 3 capsules (2001 mg) by mouth with supper & take 2 capsules (1334 mg) by mouth with snacks.  . carvedilol (COREG) 3.125 MG tablet Take 1  tablet (3.125 mg total) by mouth 2 (two) times daily.  [name] diltiazem 2 % GEL Apply 1 application topically 3 (three) times daily. Apply to affected area on anus  . diphenhydrAMINE (BENADRYL) 25 mg capsule Take 25 mg by mouth every 6 (six) hours as needed for allergies.  [name] gabapentin (NEURONTIN) 100 MG capsule Take 200 mg by mouth See admin instructions. Take 2 capsules (200 mg) by mouth 3 times on Sundays, Mondays, Wednesdays, & Fridays.  [name] gabapentin (NEURONTIN) 300 MG capsule Take 300 mg by mouth See admin instructions. Take 1 capsule (300 mg) by mouth twice daily on Tuesdays, Thursdays, & Saturdays.  [name] glucose blood (ACCU-CHEK GUIDE) test strip Check blood sugar up to 4 times a day  . hydrALAZINE (APRESOLINE) 100 MG tablet Take 100 mg by mouth See admin instructions. Bid on non-dialysis days.( m,w,f,sun)  . hydrocortisone (ANUSOL-HC) 25 MG suppository Place 1 suppository (25 mg total) rectally 2 (two) times daily.  . Iron, Ferrous Sulfate, 325 (65 Fe) MG TABS Take 325 mg by mouth daily. Note that this medication will turn stool black and may cause constipation.  . isosorbide mononitrate (IMDUR) 30 MG 24 hr tablet Take 1 tablet (30 mg total) by mouth daily.  [name] [employer] [facility]) lancets Check sugars TID for E11.65  . lidocaine-prilocaine (EMLA) cream Apply 1 application topically Every Tuesday,Thursday,and Saturday with dialysis. On days of dialysis 3x week  . methocarbamol (ROBAXIN) 500 MG tablet Take 500 mg by mouth every 8 (eight) hours as needed for muscle spasms.   . misoprostol (CYTOTEC) 100 MCG tablet TAKE 1 TABLET BY MOUTH EVERY 6 HOURS  . multivitamin (RENA-VIT) TABS tablet Take 1 tablet by mouth daily.  . nitroGLYCERIN (NITROSTAT) 0.4 MG SL tablet Place 1 tablet (0.4 mg total) under the tongue every 5 (five) minutes as needed.  . pantoprazole (PROTONIX) 40 MG tablet Take 1 tablet (40 mg total) by mouth daily. 40 mg BID for 2 months & then once daily  . polyethylene glycol (MIRALAX / GLYCOLAX) 17 g packet Take 17 g by mouth daily as needed for moderate constipation.  . sertraline (ZOLOFT) 100 MG tablet Take 2 tablets (200 mg total) by mouth daily.  . traZODone (DESYREL) 50 MG tablet Take 50 mg by mouth at bedtime.  [name] UNABLE TO FIND Prosthetic socket, from [facility]  . [DISCONTINUED] cloNIDine (CATAPRES) 0.1 MG tablet Take 0.1 mg by mouth See admin instructions. Bid on non-dialysis days( m,w,f,sun)     Allergies:   Compazine [prochlorperazine], Shellfish-derived products, Iodinated diagnostic agents, Omnipaque [iohexol], Tape, and Sulfa antibiotics   Social History   Socioeconomic History  . Marital status: Single    Spouse name: Not on file  . Number of children: 3  . Years of education: Not on file  . Highest education level: Not on file  Occupational History  . Occupation: disabled  Tobacco Use  . Smoking status: Current Every Day Smoker    Packs/day: 0.50    Years: 35.00    Pack years: 17.50    Types: Cigarettes  . Smokeless tobacco: Never Used  Vaping Use  . Vaping Use: Never used  Substance and Sexual Activity  . Alcohol use: Not Currently    Alcohol/week: 0.0 standard drinks    Comment: 03/10/2017 "I drink a wine cooler a few  times/year"  . Drug use: No  . Sexual activity: Not Currently  Other Topics Concern  . Not on file  Social History Narrative  . Not  on file   Social Determinants of Health   Financial Resource Strain: Not on file  Food Insecurity: Not on file  Transportation Needs: Not on file  Physical Activity: Not on file  Stress: Not on file  Social Connections: Not on file     Family History: [name] family history includes Cancer in her brother; Diabetes in her brother; Heart disease in her father; Hypertension in her brother and sister; Uterine cancer in her mother and sister. There is no history of Colon cancer, Colon polyps, Esophageal cancer, Rectal cancer, or Stomach cancer.  ROS:   Please see the history of present illness.    All other systems reviewed and are negative.  EKGs/Labs/Other Studies Reviewed:    The following studies were reviewed today:   EKG:  EKG is ordered today.  The ekg ordered today demonstrates normal sinus rhythm, rate 75, Q waves in V1-2, nonspecific T wave flattening with T wave inversion in V6  Recent Labs: 05/28/2020: Magnesium 2.1 06/21/2020: ALT 15 06/23/2020: BUN 29; Creatinine, Ser 4.45; Potassium 3.6; Sodium 137 06/25/2020: Hemoglobin 7.8; Platelets 165  Recent Lipid Panel    Component Value Date/Time   CHOL 122 04/16/2020 1132   TRIG 82 04/16/2020 1132   HDL 83 04/16/2020 1132   CHOLHDL 1.5 04/16/2020 1132   VLDL 16 04/16/2020 1132   LDLCALC 23 04/16/2020 [place] 03/3019:  Nuclear stress EF: 51%. No wall motion abnormalities. Low normal ejection fraction.  There was no ST segment deviation noted during stress.  Defect 1: There is a small fixed defect of mild severity at the apex. No evidence of ischemia identified.  This is a low risk study. No ischemia identified.  TTE 04/13/19:   1. The AoV is tricuspid and moderately calcified. Mild aortic stenosis is  present with Vmax 2.9 mmHg and mean gradient 18 mmHg.  Moderate aortic  regurgitation is present with PHT 352 msec. The aortic root is normal in  size. This appears to be  degenerative calcific AoV disease.  2. Left ventricular ejection fraction, by visual estimation, is 60 to  65%. The left ventricle has normal function. There is moderately increased  left ventricular hypertrophy.  3. Elevated left atrial pressure.  4. Left ventricular diastolic parameters are consistent with Grade II  diastolic dysfunction (pseudonormalization).  5. The left ventricle has no regional wall motion abnormalities.  6. Global right ventricle has normal systolic function.The right  ventricular size is normal. No increase in right ventricular wall  thickness.  7. Trivial pericardial effusion is present.  8. Mild calcification of the anterior mitral valve leaflet(s).  9. Mild mitral annular calcification.  10. Mild thickening of the anterior and posterior mitral valve leaflet(s).  11. The mitral valve is degenerative. Trace mitral valve regurgitation.  12. The tricuspid valve is grossly normal. Tricuspid valve regurgitation  is trivial.  13. Aortic valve regurgitation is moderate.  14. The aortic valve is tricuspid. Aortic valve regurgitation is moderate.  Mild aortic valve stenosis.  15. There is Moderate calcification of the aortic valve.  16. There is Moderate thickening of the aortic valve.  17. The pulmonic valve was grossly normal. Pulmonic valve regurgitation is  trivial.  18. Mild plaque invoving the ascending aorta.  19. TR signal is inadequate for assessing pulmonary artery systolic  pressure.  20. The inferior vena cava is normal in size with greater than 50%  respiratory variability, suggesting right atrial pressure of 3 mmHg.   Physical Exam:  VS:  BP 120/62   Pulse 75   Ht '5\' 7"'  (1.702 m)   Wt 126 lb 9.6 oz (57.4 kg)   LMP 03/16/2002   SpO2 97%   BMI 19.83 kg/m     Wt Readings from Last 3 Encounters:  08/22/20 126 lb 9.6 oz  (57.4 kg)  08/03/20 130 lb (59 kg)  06/25/20 119 lb 14.9 oz (54.4 kg)     GEN: in no acute distress HEENT: Normal NECK: No JVD; No carotid bruits CARDIAC: RRR, 3/6 systolic murmur RESPIRATORY:  Clear to auscultation without rales, wheezing or rhonchi  ABDOMEN: Soft, non-tender, non-distended MUSCULOSKELETAL:  No edema. S/p L AKA SKIN: Warm and dry NEUROLOGIC:  Alert and oriented x 3 PSYCHIATRIC:  Normal affect   ASSESSMENT:    1. CAD in native artery   2. Chronic combined systolic and diastolic heart failure ([place])   3. Pericardial effusion   4. Palpitations   5. Nonrheumatic aortic valve stenosis   6. Aortic valve insufficiency, etiology of cardiac valve disease unspecified   7. Essential hypertension    PLAN:    CAD:  Cardiac catheterization on 04/13/2020 showed 99% proximal to mid LCx stenosis, 50% proximal RCA, 70% ostial to proximal LAD, 60% distal LCx, 90% ramus.  RHC showed elevated pressures (RA 15, RV 54/6, PA 59/24, PW 25, LVEDP 22).  She was evaluated by cardiac surgery and felt not to be a candidate for CABG.  She was also evaluated by interventional cardiology and recommended medical management. -Patient requesting referral to interventional cardiology at [facility] for second opinion.  However she has had repeated GI bleeding so holding off for now -Continue  atorvastatin, Coreg 3.125 mg twice daily, and Imdur 30 mg daily.  PRN SL NTG.  Would restart aspirin 81 mg daily once OK per GI  Chronic combined systolic and diastolic heart failure:  Lexiscan Myoview was done for chest pain on 04/15/2019, which showed small fixed defect at apex, otherwise no abnormalities.  TTE on 04/13/2019 showed mild aortic stenosis, moderate aortic regurgitation, normal EF, grade 2 diastolic dysfunction, normal RV function.Repeat echo on 03/28/2020 was done to follow-up her aortic regurgitation, which showed new LV systolic dysfunction (EF 30 to 35%, global hypokinesis), moderate LVH, grade 2  diastolic dysfunction, normal RV function, moderate RV enlargement, severe elevation in RVSP (74 mmHg), moderate pericardial effusion, moderate biatrial dilatation, mild to moderate mitral regurgitation, severe tricuspid regurgitation, moderate aortic regurgitation.  Cardiac catheterization on 04/13/2020 showed 99% proximal to mid LCx stenosis, 50% proximal RCA, 70% ostial to proximal LAD, 60% distal LCx, 90% ramus.  RHC showed elevated pressures (RA 15, RV 54/6, PA 59/24, PW 25, LVEDP 22).  Repeat echocardiogram on 05/09/2020 showed LVEF 40 to 16%, grade 2 diastolic dysfunction, normal RV function, RVSP 77 mmHg, moderate TR, mild to moderate AI, moderate AS, moderate pericardial effusion with no evidence of tamponade. -Continue carvedilol 3.125 mg twice daily -Continue hydralazine 100 mg 3 times daily and Imdur 30 mg daily -Volume removal with HD  Perciardial effusion: moderate effusion on echo 03/28/2020, repeat echocardiogram on 11/24 showed stable moderate effusion  Palpitations: Zio patch x7 days on 02/01/2020 showed one 4 beat run of NSVT, otherwise unremarkable.  Aortic regurgitation: Moderate on TTE in 03/2019.  Stable on repeat echo on 03/2020  Aortic stenosis: Moderate on echocardiogram 04/2020  Tricuspid regurgitation: Severe on echo 03/2020. Evaluated by cardiac surgery, not a candidate for intervention as above. Volume removal through HD.  Moderate TR on repeat echo 05/09/2020  Hypertension: On amlodipine 10 mg daily, hydralazine 100 mg 3 times daily on non-HD days, clonidine 0.1 mg twice daily, Coreg 3.125 mg twice daily, Imdur 30 mg daily.  Recommend weaning off clonidine to avoid use with beta-blocker, as will want to titrate up beta-blocker as tolerated.  Decrease clonidine to 0.1 mg daily on non HD days x 1 week, then stop.  Will schedule in pharmacy heart failure clinic in 2 weeks to continue to titrate heart failure meds  Type 2 diabetes: On insulin.  Last A1c 6.3 on  02/28/2019  HLD: On atorvastatin 10 mg daily.  Last LDL 23 on 04/16/2020  ESRD: On HD   RTC in 3 months   Medication Adjustments/Labs and Tests Ordered: Current medicines are reviewed at length with the patient today.  Concerns regarding medicines are outlined above.  Orders Placed This Encounter  Procedures  . EKG 12-Lead   Meds ordered this encounter  Medications  . cloNIDine (CATAPRES) 0.1 MG tablet    Sig: Starting 08/22/20: Take 0.1 mg daily (on non dialysis days) x 7 days, then STOP    Dispense:  60 tablet    Refill:  0    Patient Instructions  Medication Instructions:  DECREASE clonidine to 0.1 mg daily (on non dialysis days) x 7 days, then STOP  *If you need a refill on your cardiac medications before your next appointment, please call your pharmacy*  Follow-Up: At [facility], you and your health needs are our priority.  As part of our continuing mission to provide you with exceptional heart care, we have created designated Provider Care Teams.  These Care Teams include your primary Cardiologist (physician) and Advanced Practice Providers (APPs -  Physician Assistants and Nurse Practitioners) who all work together to provide you with the care you need, when you need it.  We recommend signing up for the patient portal called "MyChart".  Sign up information is provided on this After Visit Summary.  MyChart is used to connect with patients for Virtual Visits (Telemedicine).  Patients are able to view lab/test results, encounter notes, upcoming appointments, etc.  Non-urgent messages can be sent to your provider as well.   To learn more about what you can do with MyChart, go to [email or web address].    Your next appointment:   2 weeks with pharmacist (HF med titration) 3 months with Dr. Gardiner Rhyme      Signed, Donato Heinz, MD  08/22/2020 12:59 PM    [place]

## 2020-08-21 DIAGNOSIS — Z992 Dependence on renal dialysis: Secondary | ICD-10-CM | POA: Diagnosis not present

## 2020-08-21 DIAGNOSIS — D509 Iron deficiency anemia, unspecified: Secondary | ICD-10-CM | POA: Diagnosis not present

## 2020-08-21 DIAGNOSIS — N2581 Secondary hyperparathyroidism of renal origin: Secondary | ICD-10-CM | POA: Diagnosis not present

## 2020-08-21 DIAGNOSIS — N186 End stage renal disease: Secondary | ICD-10-CM | POA: Diagnosis not present

## 2020-08-22 ENCOUNTER — Other Ambulatory Visit: Payer: Self-pay

## 2020-08-22 ENCOUNTER — Ambulatory Visit (INDEPENDENT_AMBULATORY_CARE_PROVIDER_SITE_OTHER): Payer: Medicare Other | Admitting: Cardiology

## 2020-08-22 ENCOUNTER — Encounter: Payer: Self-pay | Admitting: Cardiology

## 2020-08-22 VITALS — BP 120/62 | HR 75 | Ht 67.0 in | Wt 126.6 lb

## 2020-08-22 DIAGNOSIS — I350 Nonrheumatic aortic (valve) stenosis: Secondary | ICD-10-CM

## 2020-08-22 DIAGNOSIS — I10 Essential (primary) hypertension: Secondary | ICD-10-CM | POA: Diagnosis not present

## 2020-08-22 DIAGNOSIS — I5042 Chronic combined systolic (congestive) and diastolic (congestive) heart failure: Secondary | ICD-10-CM

## 2020-08-22 DIAGNOSIS — I2510 Atherosclerotic heart disease of native coronary artery without angina pectoris: Secondary | ICD-10-CM

## 2020-08-22 DIAGNOSIS — I351 Nonrheumatic aortic (valve) insufficiency: Secondary | ICD-10-CM | POA: Diagnosis not present

## 2020-08-22 DIAGNOSIS — I313 Pericardial effusion (noninflammatory): Secondary | ICD-10-CM

## 2020-08-22 DIAGNOSIS — I3139 Other pericardial effusion (noninflammatory): Secondary | ICD-10-CM

## 2020-08-22 DIAGNOSIS — R002 Palpitations: Secondary | ICD-10-CM

## 2020-08-22 MED ORDER — CLONIDINE HCL 0.1 MG PO TABS: ORAL_TABLET | ORAL | 0 refills | Status: DC

## 2020-08-22 NOTE — Patient Instructions (Signed)
Medication Instructions:  DECREASE clonidine to 0.1 mg daily (on non dialysis days) x 7 days, then STOP  *If you need a refill on your cardiac medications before your next appointment, please call your pharmacy*  Follow-Up: At [facility], you and your health needs are our priority.  As part of our continuing mission to provide you with exceptional heart care, we have created designated Provider Care Teams.  These Care Teams include your primary Cardiologist (physician) and Advanced Practice Providers (APPs -  Physician Assistants and Nurse Practitioners) who all work together to provide you with the care you need, when you need it.  We recommend signing up for the patient portal called "MyChart".  Sign up information is provided on this After Visit Summary.  MyChart is used to connect with patients for Virtual Visits (Telemedicine).  Patients are able to view lab/test results, encounter notes, upcoming appointments, etc.  Non-urgent messages can be sent to your provider as well.   To learn more about what you can do with MyChart, go to [email or web address].    Your next appointment:   2 weeks with pharmacist (HF med titration) 3 months with Dr. Gardiner Rhyme

## 2020-08-24 ENCOUNTER — Encounter: Payer: Self-pay | Admitting: Nurse Practitioner

## 2020-08-24 ENCOUNTER — Ambulatory Visit (INDEPENDENT_AMBULATORY_CARE_PROVIDER_SITE_OTHER): Payer: Medicare Other | Admitting: Nurse Practitioner

## 2020-08-24 ENCOUNTER — Other Ambulatory Visit (INDEPENDENT_AMBULATORY_CARE_PROVIDER_SITE_OTHER): Payer: Medicare Other

## 2020-08-24 ENCOUNTER — Telehealth: Payer: Self-pay

## 2020-08-24 VITALS — BP 140/82 | HR 90 | Ht 67.0 in | Wt 128.4 lb

## 2020-08-24 DIAGNOSIS — D649 Anemia, unspecified: Secondary | ICD-10-CM

## 2020-08-24 DIAGNOSIS — R112 Nausea with vomiting, unspecified: Secondary | ICD-10-CM

## 2020-08-24 DIAGNOSIS — Z992 Dependence on renal dialysis: Secondary | ICD-10-CM | POA: Diagnosis not present

## 2020-08-24 DIAGNOSIS — R197 Diarrhea, unspecified: Secondary | ICD-10-CM

## 2020-08-24 DIAGNOSIS — N186 End stage renal disease: Secondary | ICD-10-CM

## 2020-08-24 LAB — COMPREHENSIVE METABOLIC PANEL
ALT: 15 U/L (ref 0–35)
AST: 10 U/L (ref 0–37)
Albumin: 3.1 g/dL — ABNORMAL LOW (ref 3.5–5.2)
Alkaline Phosphatase: 75 U/L (ref 39–117)
BUN: 36 mg/dL — ABNORMAL HIGH (ref 6–23)
CO2: 29 mEq/L (ref 19–32)
Calcium: 9.4 mg/dL (ref 8.4–10.5)
Chloride: 96 mEq/L (ref 96–112)
Creatinine, Ser: 5.48 mg/dL (ref 0.40–1.20)
GFR: 7.66 mL/min — CL (ref >60.00)
Glucose, Bld: 113 mg/dL — ABNORMAL HIGH (ref 70–99)
Potassium: 4.1 mEq/L (ref 3.5–5.1)
Sodium: 136 mEq/L (ref 135–145)
Total Bilirubin: 0.5 mg/dL (ref 0.2–1.2)
Total Protein: 6.4 g/dL (ref 6.0–8.3)

## 2020-08-24 LAB — CBC WITH DIFFERENTIAL/PLATELET
Basophils Absolute: 0 10*3/uL (ref 0.0–0.1)
Basophils Relative: 0.9 % (ref 0.0–3.0)
Eosinophils Absolute: 0 10*3/uL (ref 0.0–0.7)
Eosinophils Relative: 0.5 % (ref 0.0–5.0)
HCT: 34.3 % — ABNORMAL LOW (ref 36.0–46.0)
Hemoglobin: 11.1 g/dL — ABNORMAL LOW (ref 12.0–15.0)
Lymphocytes Relative: 9 % — ABNORMAL LOW (ref 12.0–46.0)
Lymphs Abs: 0.5 10*3/uL — ABNORMAL LOW (ref 0.7–4.0)
MCHC: 32.3 g/dL (ref 30.0–36.0)
MCV: 90 fl (ref 78.0–100.0)
Monocytes Absolute: 0.3 10*3/uL (ref 0.1–1.0)
Monocytes Relative: 5.1 % (ref 3.0–12.0)
Neutro Abs: 4.7 10*3/uL (ref 1.4–7.7)
Neutrophils Relative %: 84.5 % — ABNORMAL HIGH (ref 43.0–77.0)
Platelets: 250 10*3/uL (ref 150.0–400.0)
RBC: 3.81 Mil/uL — ABNORMAL LOW (ref 3.87–5.11)
RDW: 17.6 % — ABNORMAL HIGH (ref 11.5–15.5)
WBC: 5.6 10*3/uL (ref 4.0–10.5)

## 2020-08-24 MED ORDER — FAMOTIDINE 40 MG PO TABS: 40.0000 mg | ORAL_TABLET | Freq: Every day | ORAL | 1 refills | Status: DC

## 2020-08-24 NOTE — Telephone Encounter (Signed)
Critical lab results creatinine and GFR.

## 2020-08-24 NOTE — Telephone Encounter (Signed)
Patient has known ESRD on HD

## 2020-08-24 NOTE — Patient Instructions (Addendum)
If you are age 66 or older, your body mass index should be between 23-30. Your Body mass index is 20.11 kg/m. If this is out of the aforementioned range listed, please consider follow up with your Primary Care Provider.  RECOMMENDATIONS: Eat a bland diet. Fluids per renal restrictions. Imodium 2 tablets twice a day, hold if you do not have a bowel movement in 24 hours.  LABS:  Lab work has been ordered for you today. Our lab is located in the basement. Press "B" on the elevator. The lab is located at the first door on the left as you exit the elevator.  HEALTHCARE LAWS AND MY CHART RESULTS: Due to recent changes in healthcare laws, you may see the results of your imaging and laboratory studies on MyChart before your provider has had a chance to review them.   We understand that in some cases there may be results that are confusing or concerning to you. Not all laboratory results come back in the same time frame and the provider may be waiting for multiple results in order to interpret others.  Please give [place] 48 hours in order for your provider to thoroughly review all the results before contacting the office for clarification of your results.   MEDICATION: We have sent the following medication to your pharmacy for you to pick up at your convenience: Famotidine 40 MG daily at bedtime.  We have scheduled you a follow up with Dr. Silverio Decamp on 11/01/20 at 8:30AM  It was great seeing you today! Thank you for entrusting me with your care and choosing [facility].  Noralyn Pick, CRNP

## 2020-08-24 NOTE — Progress Notes (Signed)
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   She was initially seen in our office by Brandy Savoy NP 11/23/2019 for anemia evaluation. An EGD and colonoscopy were done by Dr. Silverio Houston 02/08/2020.  The EGD identified grade B esophagitis, esophageal candidiasis, moderate gastric antral vascular ectasia with contact bleeding in the gastric antrum and in the prepyloric region, a few 20m erosions with stigmata of recent bleeding were found in the entire examined stomach and patch erythematous mucosa was present in the duodenum. Biopsies were negative for H. Pylori. The colonoscopy identified moderate diverticulosis in the sigmoid, descending, transverse and ascending colon. The preparation was suboptimal and a repeat colonoscopy was recommended. She underwent a repeat EGD as an outpatient 05/14/2020 which showed grade B esophagitis, nonbleeding antral AVM treated with APC.   She was admitted to the hospital with anemia and + FOBT 05/26/2020. Hg 6.1. Transfused 2 units or PRBCs. She underwent an EGD/small bowel enteroscopy 05/28/2020 which showed grade C reflux esophagitis, candidiasis esophagitis, GAVE with bleeding treated with APC, a few bleeding angiodysplastic lesions in the stomach treated with APC, 2 AVMs in the duodenum and 2 AVMs in the jejunum were treated were all treated with APC. She was treated with Diflucan, Pantoprazole 487mpo bid and Misoprostol 10010mqid x 4 weeks. She was discharged home on 12/14 with Hg level of 7.9,  repeat Hg 9.0.   She was readmitted to the hospital on 06/20/2020 due to having rectal bleeding x 4 days. Hg level was 5.5. Transfused 2 units of PRBCs -> Hg 8.2. Tagged RBC scan 1/6 was negative. 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 She was prescribed Diflucan and to continue iron supplement indefinitely.   She presents to our office today for hospital follow up and for further evaluation regarding diarrhea. She is accompanied by her son, Brandy Houston reports having mud to watery like diarrhea since the week before Christmas 2021. She has 5 to 7 episodes of nonbloody watery to mud gray yellow stools daily. No melena. Intermittent generalized abdominal pain comes and goes. She stated her abdominal pain is chronic. No new abdominal pain. She has nausea. She vomited nonbloody bilious emesis x 1 three days ago. Decreased po intake. She stated losing 30lbs over the past 3 to 4 months. She is taking Imodium 2 tabs once daily. She missed her dialysis session yesterday due to having frequent diarrhea in the morning despite taking Imodium earlier in the morning.    Current Outpatient Medications on File Prior to Visit  Medication Sig Dispense Refill  . Accu-Chek Softclix Lancets lancets Check sugars three times a day for E11.65 100 each 12  . acetaminophen (TYLENOL) 500 MG tablet Take 500-1,000 mg by mouth every 6 (six) hours as needed (for pain.).    . aMarland Kitchencorbic acid (VITAMIN C) 500 MG tablet Take 1 tablet (500 mg total) by mouth  daily.    . atorvastatin (LIPITOR) 10 MG tablet Take 1 tablet (10 mg total) by mouth at bedtime.    . blood glucose meter kit and supplies KIT Dispense based on patient and insurance preference. Use up to four times daily as directed 1 each 0  . Blood Glucose Monitoring Suppl (ACCU-CHEK GUIDE)  w/Device KIT 1 Device by Does not apply route daily. 1 kit 3  . calcium acetate (PHOSLO) 667 MG capsule Take 1,334-2,001 mg by mouth See admin instructions. Take 3 capsules (2001 mg) by mouth with breakfast, take 2 capsules (1334 mg) by mouth with lunch, take 3 capsules (2001 mg) by mouth with supper & take 2 capsules (1334 mg) by mouth with snacks.    . cloNIDine (CATAPRES) 0.1 MG tablet Starting 08/22/20: Take 0.1 mg daily (on non dialysis days) x 7 days, then STOP 60 tablet 0  . diltiazem 2 % GEL Apply 1 application topically 3 (three) times daily. Apply to affected area on anus 90 g 1  . diphenhydrAMINE (BENADRYL) 25 mg capsule Take 25 mg by mouth every 6 (six) hours as needed for allergies.    [name] gabapentin (NEURONTIN) 100 MG capsule Take 200 mg by mouth See admin instructions. Take 2 capsules (200 mg) by mouth 3 times on Sundays, Mondays, Wednesdays, & Fridays.    [name] gabapentin (NEURONTIN) 300 MG capsule Take 300 mg by mouth See admin instructions. Take 1 capsule (300 mg) by mouth twice daily on Tuesdays, Thursdays, & Saturdays.    [name] glucose blood (ACCU-CHEK GUIDE) test strip Check blood sugar up to 4 times a day 100 each 12  . hydrALAZINE (APRESOLINE) 100 MG tablet Take 100 mg by mouth See admin instructions. Bid on non-dialysis days.( m,w,f,sun)    . hydrocortisone (ANUSOL-HC) 25 MG suppository Place 1 suppository (25 mg total) rectally 2 (two) times daily. 12 suppository 0  . Iron, Ferrous Sulfate, 325 (65 Fe) MG TABS Take 325 mg by mouth daily. Note that this medication will turn stool black and may cause constipation.    . isosorbide mononitrate (IMDUR) 30 MG 24 hr tablet Take 1 tablet (30 mg total) by mouth daily. 90 tablet 3  . Lancet Devices (ACCU-CHEK SOFTCLIX) lancets Check sugars TID for E11.65 1 each 10  . lidocaine-prilocaine (EMLA) cream Apply 1 application topically Every Tuesday,Thursday,and Saturday with dialysis. On days of dialysis 3x week    . methocarbamol (ROBAXIN) 500 MG tablet  Take 500 mg by mouth every 8 (eight) hours as needed for muscle spasms.     . misoprostol (CYTOTEC) 100 MCG tablet TAKE 1 TABLET BY MOUTH EVERY 6 HOURS 120 tablet 0  . multivitamin (RENA-VIT) TABS tablet Take 1 tablet by mouth daily.    . pantoprazole (PROTONIX) 40 MG tablet Take 1 tablet (40 mg total) by mouth daily. 40 mg BID for 2 months & then once daily 90 tablet 1  . polyethylene glycol (MIRALAX / GLYCOLAX) 17 g packet Take 17 g by mouth daily as needed for moderate constipation.    . sertraline (ZOLOFT) 100 MG tablet Take 2 tablets (200 mg total) by mouth daily. 180 tablet 3  . traZODone (DESYREL) 50 MG tablet Take 50 mg by mouth at bedtime.    [name] UNABLE TO FIND Prosthetic socket, from [facility] 1 each 0  . carvedilol (COREG) 3.125 MG tablet Take 1 tablet (3.125 mg total) by mouth 2 (two) times daily. 180 tablet 3  . nitroGLYCERIN (NITROSTAT) 0.4 MG SL tablet Place 1  tablet (0.4 mg total) under the tongue every 5 (five) minutes as needed. 25 tablet 3   No current facility-administered medications on file prior to visit.   Allergies  Allergen Reactions  . Compazine [Prochlorperazine] Shortness Of Breath, Swelling and Other (See Comments)    TONGUE SWELLS  . Shellfish-Derived Products Anaphylaxis  . Iodinated Diagnostic Agents Hives and Rash  . Omnipaque [Iohexol] Hives  . Tape     Brown paper tape caused skin irritation  . Sulfa Antibiotics Rash    Current Medications, Allergies, Past Medical History, Past Surgical History, Family History and Social History were reviewed in [employer] record.  Review of Systems:   Constitutional: See HPI.  Respiratory: Negative for shortness of breath.   Cardiovascular: Negative for chest pain, palpitations and leg swelling.  Gastrointestinal: See HPI.  Musculoskeletal: Negative for back pain or muscle aches.  Neurological: Negative for dizziness, headaches or paresthesias.   Physical Exam: BP 140/82   Pulse 90    Ht '5\' 7"'  (1.702 m)   Wt 128 lb 6.4 oz (58.2 kg)   LMP 03/16/2002   SpO2 100%   BMI 20.11 kg/m  General: Chronically ill 65 year old female presents in a wheelchair in NAD.  Head: Normocephalic and atraumatic. Eyes: No scleral icterus. Conjunctiva pink . Ears: Normal auditory acuity. Mouth: Absent dentition. No ulcers or lesions.  Lungs: Clear throughout to auscultation. Heart: Regular rate and rhythm. II/VI systolic murmur.  Abdomen: Soft, nondistended. Mild generalized tenderness without rebound or guarding. No masses or hepatomegaly. Normal bowel sounds x 4 quadrants.  Rectal: Deferred.  Musculoskeletal: Symmetrical with no gross deformities. Extremities: No edema. RUE fistula with + bruit and thrill. Left AKA with prosthesis in use.  Neurological: Alert oriented x 4. No focal deficits.  Psychological: Alert and cooperative. Normal mood and affect  Assessment and Recommendations:  22. 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 -CBC -Continue Ferrous Sulfate 343m daily, indefinitely  -Continue Pantoprazole 488mbid   2. Diarrhea -GI pathogen panel, CBC, CMP -Increase Imodium 2 tabs po bid -May require a diagnostic colonoscopy if GI pathogen panel negative and if diarrhea worsens -Patient to call office if diarrhea worsens  -Bland diet, fluids per renal restriction   3. Chronic generalized abdominal pain -Consider CTAP if abdominal pain worsens  4. Chronic nausea. Vomiting x 1 episode. She is allergic to Compazine. Zofran not given due to history of prolonged QT interval.  -Await the above lab results -I will consult with Dr.Nandigam regarding anti-emetic tx   3. CAD. CHF with LV EF 30 -35%  4. DM II  5. History of chronic hepatitis C treated in 2016  6. Colon cancer screening. Colonoscopy resulted in suboptimal prep 01/2020 -eventual repeat colonoscopy with 2 day  bowel prep  7. History of chronic hepatitis C treated in 2016 with SVR. Normal LFTs.

## 2020-08-25 DIAGNOSIS — N2581 Secondary hyperparathyroidism of renal origin: Secondary | ICD-10-CM | POA: Diagnosis not present

## 2020-08-25 DIAGNOSIS — Z992 Dependence on renal dialysis: Secondary | ICD-10-CM | POA: Diagnosis not present

## 2020-08-25 DIAGNOSIS — N186 End stage renal disease: Secondary | ICD-10-CM | POA: Diagnosis not present

## 2020-08-25 DIAGNOSIS — D509 Iron deficiency anemia, unspecified: Secondary | ICD-10-CM | POA: Diagnosis not present

## 2020-08-28 DIAGNOSIS — N186 End stage renal disease: Secondary | ICD-10-CM | POA: Diagnosis not present

## 2020-08-28 DIAGNOSIS — Z992 Dependence on renal dialysis: Secondary | ICD-10-CM | POA: Diagnosis not present

## 2020-08-28 DIAGNOSIS — N2581 Secondary hyperparathyroidism of renal origin: Secondary | ICD-10-CM | POA: Diagnosis not present

## 2020-08-28 DIAGNOSIS — D509 Iron deficiency anemia, unspecified: Secondary | ICD-10-CM | POA: Diagnosis not present

## 2020-08-31 ENCOUNTER — Other Ambulatory Visit: Payer: Medicare Other

## 2020-08-31 ENCOUNTER — Other Ambulatory Visit: Payer: Self-pay | Admitting: Family Medicine

## 2020-08-31 DIAGNOSIS — R197 Diarrhea, unspecified: Secondary | ICD-10-CM | POA: Diagnosis not present

## 2020-08-31 DIAGNOSIS — Z992 Dependence on renal dialysis: Secondary | ICD-10-CM | POA: Diagnosis not present

## 2020-08-31 DIAGNOSIS — D649 Anemia, unspecified: Secondary | ICD-10-CM | POA: Diagnosis not present

## 2020-08-31 DIAGNOSIS — N186 End stage renal disease: Secondary | ICD-10-CM | POA: Diagnosis not present

## 2020-08-31 DIAGNOSIS — R112 Nausea with vomiting, unspecified: Secondary | ICD-10-CM | POA: Diagnosis not present

## 2020-09-01 DIAGNOSIS — N2581 Secondary hyperparathyroidism of renal origin: Secondary | ICD-10-CM | POA: Diagnosis not present

## 2020-09-01 DIAGNOSIS — Z992 Dependence on renal dialysis: Secondary | ICD-10-CM | POA: Diagnosis not present

## 2020-09-01 DIAGNOSIS — D509 Iron deficiency anemia, unspecified: Secondary | ICD-10-CM | POA: Diagnosis not present

## 2020-09-01 DIAGNOSIS — N186 End stage renal disease: Secondary | ICD-10-CM | POA: Diagnosis not present

## 2020-09-03 ENCOUNTER — Telehealth: Payer: Self-pay | Admitting: Nurse Practitioner

## 2020-09-03 LAB — GI PROFILE, STOOL, PCR

## 2020-09-03 NOTE — Telephone Encounter (Signed)
Left message for patient to call back  

## 2020-09-03 NOTE — Telephone Encounter (Signed)
Inbound call from patient stating she is having rectal bleeding again.  Please advise.

## 2020-09-04 DIAGNOSIS — D509 Iron deficiency anemia, unspecified: Secondary | ICD-10-CM | POA: Diagnosis not present

## 2020-09-04 DIAGNOSIS — N2581 Secondary hyperparathyroidism of renal origin: Secondary | ICD-10-CM | POA: Diagnosis not present

## 2020-09-04 DIAGNOSIS — Z992 Dependence on renal dialysis: Secondary | ICD-10-CM | POA: Diagnosis not present

## 2020-09-04 DIAGNOSIS — N186 End stage renal disease: Secondary | ICD-10-CM | POA: Diagnosis not present

## 2020-09-05 ENCOUNTER — Telehealth: Payer: Self-pay | Admitting: Gastroenterology

## 2020-09-05 NOTE — Telephone Encounter (Signed)
Left message on machine to call back  

## 2020-09-06 DIAGNOSIS — Z992 Dependence on renal dialysis: Secondary | ICD-10-CM | POA: Diagnosis not present

## 2020-09-06 DIAGNOSIS — N186 End stage renal disease: Secondary | ICD-10-CM | POA: Diagnosis not present

## 2020-09-06 DIAGNOSIS — D509 Iron deficiency anemia, unspecified: Secondary | ICD-10-CM | POA: Diagnosis not present

## 2020-09-06 DIAGNOSIS — N2581 Secondary hyperparathyroidism of renal origin: Secondary | ICD-10-CM | POA: Diagnosis not present

## 2020-09-06 NOTE — Progress Notes (Signed)
[name]                 DOB: [birth year]                      MRN: [id number]     HPI: Brandy Houston is a 65 y.o. female referred by Dr. Gardiner Rhyme to pharmacy clinic for HF medication management. PMH is significant for multivessel CAD, HFrEF, ESRD on HD, type 2 diabetes, moderate aortic regurgitation, HTN, anemia, septic arthritis s/p L AKA.   Patient had ED visit 11/27/2018 for chest pain, work up unremarkable. Lexiscan Myoview was done on 04/15/2019, which showed small fixed defect at apex, otherwise no abnormalities.  TTE on 04/13/2019 showed mild aortic stenosis, moderate aortic regurgitation, normal EF, grade 2 diastolic dysfunction, normal RV function.  Zio patch x7 days on 02/01/2020 showed one 4 beat run of NSVT, otherwise unremarkable.  Repeat echo on 03/28/2020 was done to follow-up her aortic regurgitation, which showed new LV systolic dysfunction (EF 30 to 35%, global hypokinesis), moderate LVH, grade 2 diastolic dysfunction, normal RV function, moderate RV enlargement, severe elevation in RVSP (74 mmHg), moderate pericardial effusion, moderate biatrial dilatation, mild to moderate mitral regurgitation, severe tricuspid regurgitation, moderate aortic regurgitation.  Cardiac catheterization on 04/13/2020 showed 99% proximal to mid LCx stenosis, 50% proximal RCA, 70% ostial to proximal LAD, 60% distal LCx, 90% ramus.  RHC showed elevated pressures (RA 15, RV 54/6, PA 59/24, PW 25, LVEDP 22).  She was evaluated by cardiac surgery and felt not to be a candidate for CABG.  She was also evaluated by interventional cardiology and recommended medical management.  Repeat echocardiogram on 05/09/2020 showed LVEF 40 to 68%, grade 2 diastolic dysfunction, normal RV function, RVSP 77 mmHg, moderate TR, mild to moderate AI, moderate AS, moderate pericardial effusion with no evidence of tamponade. She was also admitted to [facility] 1/5[facility]03/2021 with GI bleed.   Last seen by Dr. Gardiner Rhyme 08/22/20, BP was  120/62. Decreased clonidine to 0.1 mg daily on non HD days for 1 week then advised to stop. Weaned clonidine to avoid use with beta blocker with plan to titrate up beta blocker as tolerated. Referred to HTN clinic for titration of HF meds.   Today she returns to pharmacy clinic for further medication titration. Arrives in a wheelchair accompanied by her son. Symptomatically, she is feeling good, reports occasional dizziness when she stands up too quickly. This occurrs about 3x per week and goes away quickly. It does not feel like she will pass out, just dizziness that subsides momentarily. Denies chest pain or palpitations, SOB, and swelling.  She does not check her weight at home because this is difficult to do with her amputation. Denies LEE, PND, or orthopnea. She does not add salt to foods but per her son, she does eat foods with salt. Her BP cuff needs a new battery so she has not been checking at home. Her son said he would help her replace it. She has stopped clonidine, doesn't feel any differently off of it.   Current CHF meds:  Carvedilol 3.125 mg BID Imdur 30 mg daily Hydralazine 100 mg TID on non-HD days   BP goal: <130/46mHg  Family History: Diabetes in her brother; Heart disease in her father; Hypertension in her brother and sister  Social History: Current every day smoker (0.5 pack/day)  Diet: Per son she "eats like she's from [place]" doesn't add salt to food but eats salty foods, tries  to buy low sodium  Exercise: None, limited by amputation   Home BP readings: None due to needing new battery  Labs:  08/24/20: Na 136, K 4.1, Scr 5.48 (ESRD)  Wt Readings from Last 3 Encounters:  08/24/20 128 lb 6.4 oz (58.2 kg)  08/22/20 126 lb 9.6 oz (57.4 kg)  08/03/20 130 lb (59 kg)   BP Readings from Last 3 Encounters:  08/24/20 140/82  08/22/20 120/62  08/03/20 (!) 142/68   Pulse Readings from Last 3 Encounters:  08/24/20 90  08/22/20 75  08/03/20 (!) 59    Renal  function: Estimated Creatinine Clearance: 9.4 mL/min (A) (by C-G formula based on SCr of 5.48 mg/dL [facility])).  Past Medical History:  Diagnosis Date  . Allergy   . Anemia   . Anxiety   . Arthritis    "in my joints" (03/10/2017)  . Chronic diastolic CHF (congestive heart failure) ([place])   . CKD (chronic kidney disease), stage IV ([place])    stage IV. previous HD, none currently 10/30/15 (confirmed 02/05/2017 & 03/10/2017)  . Depression    Chronic  . Diabetic peripheral neuropathy ([place])   . Dysrhythmia    tachycardia, normal ECHO 08-09-14  . GERD (gastroesophageal reflux disease)   . Heart murmur     dx'd 02/05/2017  . Hepatitis C    "tx'd in 2016; I'm negative now" (02/05/2017)  . History of blood transfusion 2017   "w/knee replacement"  . Hyperlipidemia   . Hypertension   . Osteoarthritis of left knee   . Peripheral neuropathy   . Protein calorie malnutrition ([place])   . Septic arthritis ([place])   . Slow transit constipation   . Symptomatic anemia 01/27/2020  . Type II diabetes mellitus ([name])    IDDM  . Uncontrolled hypertension 02/18/2013  . Unsteady gait     Current Outpatient Medications on File Prior to Visit  Medication Sig Dispense Refill  . Accu-Chek Softclix Lancets lancets Check sugars three times a day for E11.65 100 each 12  . acetaminophen (TYLENOL) 500 MG tablet Take 500-1,000 mg by mouth every 6 (six) hours as needed (for pain.).    [name] ascorbic acid (VITAMIN C) 500 MG tablet Take 1 tablet (500 mg total) by mouth daily.    [name] atorvastatin (LIPITOR) 10 MG tablet Take 1 tablet (10 mg total) by mouth at bedtime.    . blood glucose meter kit and supplies KIT Dispense based on patient and insurance preference. Use up to four times daily as directed 1 each 0  . Blood Glucose Monitoring Suppl (ACCU-CHEK GUIDE) w/Device KIT 1 Device by Does not apply route daily. 1 kit 3  . calcium acetate (PHOSLO) 667 MG capsule Take 1,334-2,001 mg by mouth See admin instructions. Take 3 capsules (2001  mg) by mouth with breakfast, take 2 capsules (1334 mg) by mouth with lunch, take 3 capsules (2001 mg) by mouth with supper & take 2 capsules (1334 mg) by mouth with snacks.    . carvedilol (COREG) 3.125 MG tablet Take 1 tablet (3.125 mg total) by mouth 2 (two) times daily. 180 tablet 3  . cloNIDine (CATAPRES) 0.1 MG tablet Starting 08/22/20: Take 0.1 mg daily (on non dialysis days) x 7 days, then STOP 60 tablet 0  . diltiazem 2 % GEL Apply 1 application topically 3 (three) times daily. Apply to affected area on anus 90 g 1  . diphenhydrAMINE (BENADRYL) 25 mg capsule Take 25 mg by mouth every 6 (six) hours as needed for allergies.    [name]  famotidine (PEPCID) 40 MG tablet Take 1 tablet (40 mg total) by mouth daily. 30 tablet 1  . gabapentin (NEURONTIN) 100 MG capsule Take 200 mg by mouth See admin instructions. Take 2 capsules (200 mg) by mouth 3 times on Sundays, Mondays, Wednesdays, & Fridays.    [name] gabapentin (NEURONTIN) 300 MG capsule Take 300 mg by mouth See admin instructions. Take 1 capsule (300 mg) by mouth twice daily on Tuesdays, Thursdays, & Saturdays.    [name] glucose blood (ACCU-CHEK GUIDE) test strip Check blood sugar up to 4 times a day 100 each 12  . hydrALAZINE (APRESOLINE) 100 MG tablet Take 100 mg by mouth See admin instructions. Bid on non-dialysis days.( m,w,f,sun)    . hydrocortisone (ANUSOL-HC) 25 MG suppository Place 1 suppository (25 mg total) rectally 2 (two) times daily. 12 suppository 0  . Iron, Ferrous Sulfate, 325 (65 Fe) MG TABS Take 325 mg by mouth daily. Note that this medication will turn stool black and may cause constipation.    . isosorbide mononitrate (IMDUR) 30 MG 24 hr tablet Take 1 tablet (30 mg total) by mouth daily. 90 tablet 3  . Lancet Devices (ACCU-CHEK SOFTCLIX) lancets Check sugars TID for E11.65 1 each 10  . lidocaine-prilocaine (EMLA) cream Apply 1 application topically Every Tuesday,Thursday,and Saturday with dialysis. On days of dialysis 3x week    .  methocarbamol (ROBAXIN) 500 MG tablet Take 500 mg by mouth every 8 (eight) hours as needed for muscle spasms.     . misoprostol (CYTOTEC) 100 MCG tablet TAKE 1 TABLET BY MOUTH EVERY 6 HOURS 120 tablet 0  . multivitamin (RENA-VIT) TABS tablet Take 1 tablet by mouth daily.    . nitroGLYCERIN (NITROSTAT) 0.4 MG SL tablet Place 1 tablet (0.4 mg total) under the tongue every 5 (five) minutes as needed. 25 tablet 3  . pantoprazole (PROTONIX) 40 MG tablet Take 1 tablet (40 mg total) by mouth daily. 40 mg BID for 2 months & then once daily 90 tablet 1  . polyethylene glycol (MIRALAX / GLYCOLAX) 17 g packet Take 17 g by mouth daily as needed for moderate constipation.    . sertraline (ZOLOFT) 100 MG tablet Take 2 tablets (200 mg total) by mouth daily. 180 tablet 3  . traZODone (DESYREL) 50 MG tablet Take 50 mg by mouth at bedtime.    [name] UNABLE TO FIND Prosthetic socket, from [facility] 1 each 0   No current facility-administered medications on file prior to visit.    Allergies  Allergen Reactions  . Compazine [Prochlorperazine] Shortness Of Breath, Swelling and Other (See Comments)    TONGUE SWELLS  . Shellfish-Derived Products Anaphylaxis  . Iodinated Diagnostic Agents Hives and Rash  . Omnipaque [Iohexol] Hives  . Tape     Brown paper tape caused skin irritation  . Sulfa Antibiotics Rash     Assessment/Plan:  1. CHF - Blood pressure in office today is 118/72 which is at goal <130/80 after discontinuing clonidine. Patient is tolerating this well. Plan was to increase beta blocker as tolerated per Dr. Gardiner Rhyme. However, with her heart rate today of 52 and reports of occasional dizziness, will continue current dose: carvedilol 3.125 mg BID. Also continue hydralazine 100 mg TID on non-HD days and Imdur 30 mg daily. Patient's son will help her replace batteries on BP cuff and she will start checking daily. Provided her with log to write down readings and heart rate. Counseled her to reduce intake  of foods high in sodium. Will  call patient in 2-3 weeks to review home BP/HR readings and dizziness. She has follow up scheduled with Dr. Gardiner Rhyme in June.   Rebbeca Paul, PharmD PGY1 Pharmacy Resident 09/07/2020 11:20 AM

## 2020-09-07 ENCOUNTER — Other Ambulatory Visit: Payer: Self-pay

## 2020-09-07 ENCOUNTER — Ambulatory Visit (INDEPENDENT_AMBULATORY_CARE_PROVIDER_SITE_OTHER): Payer: Medicare Other | Admitting: Student-PharmD

## 2020-09-07 VITALS — BP 118/72 | HR 52

## 2020-09-07 DIAGNOSIS — I5032 Chronic diastolic (congestive) heart failure: Secondary | ICD-10-CM

## 2020-09-07 NOTE — Patient Instructions (Addendum)
It was nice to see you today!  Your goal blood pressure is less than 130/80 mmHg. In clinic, your blood pressure was 118/72 mmHg. Your heart rate was 52.   Medication Changes: Continue carvedilol 3.125 mg twice daily, Imdur 30 mg daily, and hydralazine 100 mg three times daily on non-dialysis days  Monitor blood pressure at home daily and keep a log (on your phone or piece of paper) to bring with you to your next visit. Write down date, time, blood pressure and pulse.   We will call you in a few weeks to see how your blood pressure and dizziness is doing.   Keep up the good work with diet and exercise. Aim for a diet full of vegetables, fruit and lean meats (chicken, [place], fish). Try to limit salt intake by eating fresh or frozen vegetables (instead of canned), rinse canned vegetables prior to cooking and do not add any additional salt to meals.   Please give [place] a call at [phone] with any questions or concerns.

## 2020-09-08 ENCOUNTER — Encounter (HOSPITAL_COMMUNITY): Payer: Self-pay | Admitting: Internal Medicine

## 2020-09-08 ENCOUNTER — Emergency Department (HOSPITAL_COMMUNITY): Payer: Medicare Other

## 2020-09-08 ENCOUNTER — Inpatient Hospital Stay (HOSPITAL_COMMUNITY)
Admission: EM | Admit: 2020-09-08 | Discharge: 2020-09-11 | DRG: 377 | Disposition: A | Discharge status: Home / Self Care | Payer: Medicare Other | Attending: Internal Medicine | Admitting: Internal Medicine

## 2020-09-08 DIAGNOSIS — F419 Anxiety disorder, unspecified: Secondary | ICD-10-CM | POA: Diagnosis present

## 2020-09-08 DIAGNOSIS — R531 Weakness: Secondary | ICD-10-CM | POA: Diagnosis not present

## 2020-09-08 DIAGNOSIS — Z91048 Other nonmedicinal substance allergy status: Secondary | ICD-10-CM

## 2020-09-08 DIAGNOSIS — N189 Chronic kidney disease, unspecified: Secondary | ICD-10-CM | POA: Diagnosis not present

## 2020-09-08 DIAGNOSIS — D62 Acute posthemorrhagic anemia: Secondary | ICD-10-CM | POA: Diagnosis present

## 2020-09-08 DIAGNOSIS — Z20822 Contact with and (suspected) exposure to covid-19: Secondary | ICD-10-CM | POA: Diagnosis present

## 2020-09-08 DIAGNOSIS — Z833 Family history of diabetes mellitus: Secondary | ICD-10-CM

## 2020-09-08 DIAGNOSIS — D631 Anemia in chronic kidney disease: Secondary | ICD-10-CM

## 2020-09-08 DIAGNOSIS — Z882 Allergy status to sulfonamides status: Secondary | ICD-10-CM

## 2020-09-08 DIAGNOSIS — I129 Hypertensive chronic kidney disease with stage 1 through stage 4 chronic kidney disease, or unspecified chronic kidney disease: Secondary | ICD-10-CM | POA: Diagnosis not present

## 2020-09-08 DIAGNOSIS — I25118 Atherosclerotic heart disease of native coronary artery with other forms of angina pectoris: Secondary | ICD-10-CM | POA: Diagnosis present

## 2020-09-08 DIAGNOSIS — Z888 Allergy status to other drugs, medicaments and biological substances status: Secondary | ICD-10-CM

## 2020-09-08 DIAGNOSIS — I2510 Atherosclerotic heart disease of native coronary artery without angina pectoris: Secondary | ICD-10-CM | POA: Diagnosis present

## 2020-09-08 DIAGNOSIS — Z89612 Acquired absence of left leg above knee: Secondary | ICD-10-CM | POA: Diagnosis not present

## 2020-09-08 DIAGNOSIS — E876 Hypokalemia: Secondary | ICD-10-CM | POA: Diagnosis present

## 2020-09-08 DIAGNOSIS — R195 Other fecal abnormalities: Secondary | ICD-10-CM | POA: Diagnosis not present

## 2020-09-08 DIAGNOSIS — E1151 Type 2 diabetes mellitus with diabetic peripheral angiopathy without gangrene: Secondary | ICD-10-CM | POA: Diagnosis present

## 2020-09-08 DIAGNOSIS — Z91041 Radiographic dye allergy status: Secondary | ICD-10-CM | POA: Diagnosis not present

## 2020-09-08 DIAGNOSIS — E785 Hyperlipidemia, unspecified: Secondary | ICD-10-CM | POA: Diagnosis present

## 2020-09-08 DIAGNOSIS — R079 Chest pain, unspecified: Secondary | ICD-10-CM | POA: Diagnosis not present

## 2020-09-08 DIAGNOSIS — F339 Major depressive disorder, recurrent, unspecified: Secondary | ICD-10-CM | POA: Diagnosis present

## 2020-09-08 DIAGNOSIS — Z79899 Other long term (current) drug therapy: Secondary | ICD-10-CM

## 2020-09-08 DIAGNOSIS — K5791 Diverticulosis of intestine, part unspecified, without perforation or abscess with bleeding: Principal | ICD-10-CM | POA: Diagnosis present

## 2020-09-08 DIAGNOSIS — Z91013 Allergy to seafood: Secondary | ICD-10-CM | POA: Diagnosis not present

## 2020-09-08 DIAGNOSIS — Z96652 Presence of left artificial knee joint: Secondary | ICD-10-CM | POA: Diagnosis present

## 2020-09-08 DIAGNOSIS — Z7952 Long term (current) use of systemic steroids: Secondary | ICD-10-CM

## 2020-09-08 DIAGNOSIS — K922 Gastrointestinal hemorrhage, unspecified: Secondary | ICD-10-CM | POA: Diagnosis present

## 2020-09-08 DIAGNOSIS — F17210 Nicotine dependence, cigarettes, uncomplicated: Secondary | ICD-10-CM | POA: Diagnosis present

## 2020-09-08 DIAGNOSIS — T189XXA Foreign body of alimentary tract, part unspecified, initial encounter: Secondary | ICD-10-CM

## 2020-09-08 DIAGNOSIS — N186 End stage renal disease: Secondary | ICD-10-CM | POA: Diagnosis present

## 2020-09-08 DIAGNOSIS — K5641 Fecal impaction: Secondary | ICD-10-CM | POA: Diagnosis not present

## 2020-09-08 DIAGNOSIS — K224 Dyskinesia of esophagus: Secondary | ICD-10-CM | POA: Diagnosis present

## 2020-09-08 DIAGNOSIS — I5032 Chronic diastolic (congestive) heart failure: Secondary | ICD-10-CM | POA: Diagnosis present

## 2020-09-08 DIAGNOSIS — E1122 Type 2 diabetes mellitus with diabetic chronic kidney disease: Secondary | ICD-10-CM | POA: Diagnosis present

## 2020-09-08 DIAGNOSIS — K921 Melena: Secondary | ICD-10-CM | POA: Diagnosis not present

## 2020-09-08 DIAGNOSIS — I120 Hypertensive chronic kidney disease with stage 5 chronic kidney disease or end stage renal disease: Secondary | ICD-10-CM | POA: Diagnosis present

## 2020-09-08 DIAGNOSIS — Z8616 Personal history of COVID-19: Secondary | ICD-10-CM

## 2020-09-08 DIAGNOSIS — K2100 Gastro-esophageal reflux disease with esophagitis, without bleeding: Secondary | ICD-10-CM | POA: Diagnosis present

## 2020-09-08 DIAGNOSIS — Z992 Dependence on renal dialysis: Secondary | ICD-10-CM | POA: Diagnosis present

## 2020-09-08 DIAGNOSIS — S78119A Complete traumatic amputation at level between unspecified hip and knee, initial encounter: Secondary | ICD-10-CM | POA: Diagnosis present

## 2020-09-08 DIAGNOSIS — K625 Hemorrhage of anus and rectum: Secondary | ICD-10-CM | POA: Diagnosis not present

## 2020-09-08 DIAGNOSIS — Z8249 Family history of ischemic heart disease and other diseases of the circulatory system: Secondary | ICD-10-CM

## 2020-09-08 DIAGNOSIS — I10 Essential (primary) hypertension: Secondary | ICD-10-CM | POA: Diagnosis present

## 2020-09-08 DIAGNOSIS — J90 Pleural effusion, not elsewhere classified: Secondary | ICD-10-CM | POA: Diagnosis not present

## 2020-09-08 DIAGNOSIS — D509 Iron deficiency anemia, unspecified: Secondary | ICD-10-CM | POA: Diagnosis not present

## 2020-09-08 DIAGNOSIS — E1129 Type 2 diabetes mellitus with other diabetic kidney complication: Secondary | ICD-10-CM | POA: Diagnosis not present

## 2020-09-08 DIAGNOSIS — J811 Chronic pulmonary edema: Secondary | ICD-10-CM | POA: Diagnosis not present

## 2020-09-08 HISTORY — DX: End stage renal disease: N18.6

## 2020-09-08 LAB — BASIC METABOLIC PANEL
Anion gap: 12 (ref 5–15)
Anion gap: 9 (ref 5–15)
BUN: 37 mg/dL — ABNORMAL HIGH (ref 8–23)
BUN: 38 mg/dL — ABNORMAL HIGH (ref 8–23)
CO2: 27 mmol/L (ref 22–32)
CO2: 31 mmol/L (ref 22–32)
Calcium: 8.6 mg/dL — ABNORMAL LOW (ref 8.9–10.3)
Calcium: 8.6 mg/dL — ABNORMAL LOW (ref 8.9–10.3)
Chloride: 96 mmol/L — ABNORMAL LOW (ref 98–111)
Chloride: 95 mmol/L — ABNORMAL LOW (ref 98–111)
Creatinine, Ser: 4.71 mg/dL — ABNORMAL HIGH (ref 0.44–1.00)
Creatinine, Ser: 4.77 mg/dL — ABNORMAL HIGH (ref 0.44–1.00)
GFR, Estimated: 10 mL/min — ABNORMAL LOW (ref >60)
GFR, Estimated: 10 mL/min — ABNORMAL LOW (ref >60)
Glucose, Bld: 176 mg/dL — ABNORMAL HIGH (ref 70–99)
Glucose, Bld: 127 mg/dL — ABNORMAL HIGH (ref 70–99)
Potassium: 4 mmol/L (ref 3.5–5.1)
Potassium: 4.1 mmol/L (ref 3.5–5.1)
Sodium: 135 mmol/L (ref 135–145)
Sodium: 135 mmol/L (ref 135–145)

## 2020-09-08 LAB — CBC WITH DIFFERENTIAL/PLATELET
Abs Immature Granulocytes: 0.04 10*3/uL (ref 0.00–0.07)
Basophils Absolute: 0 10*3/uL (ref 0.0–0.1)
Basophils Relative: 0 %
Eosinophils Absolute: 0 10*3/uL (ref 0.0–0.5)
Eosinophils Relative: 0 %
HCT: 15.4 % — ABNORMAL LOW (ref 36.0–46.0)
Hemoglobin: 4.7 g/dL — CL (ref 12.0–15.0)
Immature Granulocytes: 1 %
Lymphocytes Relative: 11 %
Lymphs Abs: 0.7 10*3/uL (ref 0.7–4.0)
MCH: 28.5 pg (ref 26.0–34.0)
MCHC: 30.5 g/dL (ref 30.0–36.0)
MCV: 93.3 fL (ref 80.0–100.0)
Monocytes Absolute: 0.5 10*3/uL (ref 0.1–1.0)
Monocytes Relative: 8 %
Neutro Abs: 5.3 10*3/uL (ref 1.7–7.7)
Neutrophils Relative %: 80 %
Platelets: 234 10*3/uL (ref 150–400)
RBC: 1.65 MIL/uL — ABNORMAL LOW (ref 3.87–5.11)
RDW: 18.4 % — ABNORMAL HIGH (ref 11.5–15.5)
WBC: 6.5 10*3/uL (ref 4.0–10.5)
nRBC: 0 % (ref 0.0–0.2)

## 2020-09-08 LAB — TROPONIN I (HIGH SENSITIVITY)
Troponin I (High Sensitivity): 339 ng/L (ref <18)
Troponin I (High Sensitivity): 296 ng/L (ref <18)

## 2020-09-08 LAB — PREPARE RBC (CROSSMATCH)

## 2020-09-08 LAB — POC OCCULT BLOOD, ED: Fecal Occult Bld: POSITIVE — AB

## 2020-09-08 LAB — HEPATITIS B SURFACE ANTIGEN: Hepatitis B Surface Ag: NONREACTIVE

## 2020-09-08 LAB — GLUCOSE, CAPILLARY
Glucose-Capillary: 69 mg/dL — ABNORMAL LOW (ref 70–99)
Glucose-Capillary: 96 mg/dL (ref 70–99)

## 2020-09-08 MED ORDER — GABAPENTIN 300 MG PO CAPS
300.0000 mg | ORAL_CAPSULE | ORAL | Status: DC
  Administered 2020-09-08: 300 mg via ORAL
  Filled 2020-09-08 (×2): qty 1

## 2020-09-08 MED ORDER — TRAZODONE HCL 50 MG PO TABS
50.0000 mg | ORAL_TABLET | Freq: Every day | ORAL | Status: DC
Start: 2020-09-08 — End: 2020-09-11
  Administered 2020-09-08 – 2020-09-10 (×3): 50 mg via ORAL
  Filled 2020-09-08 (×3): qty 1

## 2020-09-08 MED ORDER — METHOCARBAMOL 500 MG PO TABS
500.0000 mg | ORAL_TABLET | Freq: Three times a day (TID) | ORAL | Status: DC | PRN
  Administered 2020-09-08: 500 mg via ORAL
  Filled 2020-09-08: qty 1

## 2020-09-08 MED ORDER — ONDANSETRON HCL 4 MG PO TABS: 4.0000 mg | ORAL_TABLET | Freq: Four times a day (QID) | ORAL | Status: DC | PRN

## 2020-09-08 MED ORDER — SERTRALINE HCL 100 MG PO TABS
200.0000 mg | ORAL_TABLET | Freq: Every day | ORAL | Status: DC
Start: 2020-09-09 — End: 2020-09-11
  Administered 2020-09-09 – 2020-09-10 (×2): 200 mg via ORAL
  Filled 2020-09-08 (×2): qty 2

## 2020-09-08 MED ORDER — HYDRALAZINE HCL 50 MG PO TABS
100.0000 mg | ORAL_TABLET | ORAL | Status: DC
  Administered 2020-09-09 – 2020-09-10 (×6): 100 mg via ORAL
  Filled 2020-09-08 (×6): qty 2

## 2020-09-08 MED ORDER — HYDRALAZINE HCL 20 MG/ML IJ SOLN: 5.0000 mg | INTRAMUSCULAR | Status: DC | PRN

## 2020-09-08 MED ORDER — SODIUM CHLORIDE 0.9 % IV SOLN
8.0000 mg/h | INTRAVENOUS | Status: DC
  Filled 2020-09-08: qty 80

## 2020-09-08 MED ORDER — SORBITOL 70 % SOLN
30.0000 mL | Status: DC | PRN
  Filled 2020-09-08: qty 30

## 2020-09-08 MED ORDER — ZOLPIDEM TARTRATE 5 MG PO TABS: 5.0000 mg | ORAL_TABLET | Freq: Every evening | ORAL | Status: DC | PRN

## 2020-09-08 MED ORDER — CALCIUM ACETATE (PHOS BINDER) 667 MG PO CAPS: 1334.0000 mg | ORAL_CAPSULE | ORAL | Status: DC | PRN

## 2020-09-08 MED ORDER — SODIUM CHLORIDE 0.9% FLUSH
3.0000 mL | Freq: Two times a day (BID) | INTRAVENOUS | Status: DC
  Administered 2020-09-09 – 2020-09-10 (×2): 3 mL via INTRAVENOUS

## 2020-09-08 MED ORDER — RENA-VITE PO TABS
1.0000 | ORAL_TABLET | Freq: Every day | ORAL | Status: DC
  Administered 2020-09-09 – 2020-09-10 (×2): 1 via ORAL
  Filled 2020-09-08 (×2): qty 1

## 2020-09-08 MED ORDER — HYDROXYZINE HCL 25 MG PO TABS: 25.0000 mg | ORAL_TABLET | Freq: Three times a day (TID) | ORAL | Status: DC | PRN

## 2020-09-08 MED ORDER — CALCIUM ACETATE (PHOS BINDER) 667 MG PO CAPS
1334.0000 mg | ORAL_CAPSULE | Freq: Every day | ORAL | Status: DC
  Administered 2020-09-10: 1334 mg via ORAL
  Filled 2020-09-08: qty 2

## 2020-09-08 MED ORDER — SODIUM CHLORIDE 0.9 % IV SOLN
80.0000 mg | Freq: Once | INTRAVENOUS | Status: DC
  Filled 2020-09-08: qty 80

## 2020-09-08 MED ORDER — SODIUM CHLORIDE 0.9 % IV SOLN: 10.0000 mL/h | Freq: Once | INTRAVENOUS | Status: DC

## 2020-09-08 MED ORDER — ACETAMINOPHEN 325 MG PO TABS
650.0000 mg | ORAL_TABLET | Freq: Four times a day (QID) | ORAL | Status: DC | PRN
  Administered 2020-09-09: 650 mg via ORAL
  Filled 2020-09-08: qty 2

## 2020-09-08 MED ORDER — ONDANSETRON HCL 4 MG/2ML IJ SOLN: 4.0000 mg | Freq: Four times a day (QID) | INTRAMUSCULAR | Status: DC | PRN

## 2020-09-08 MED ORDER — ACETAMINOPHEN 650 MG RE SUPP: 650.0000 mg | Freq: Four times a day (QID) | RECTAL | Status: DC | PRN

## 2020-09-08 MED ORDER — DOCUSATE SODIUM 283 MG RE ENEM
1.0000 | ENEMA | RECTAL | Status: DC | PRN
  Filled 2020-09-08: qty 1

## 2020-09-08 MED ORDER — PANTOPRAZOLE SODIUM 40 MG IV SOLR: 40.0000 mg | Freq: Two times a day (BID) | INTRAVENOUS | Status: DC

## 2020-09-08 MED ORDER — SODIUM CHLORIDE 0.9% IV SOLUTION: Freq: Once | INTRAVENOUS | Status: AC

## 2020-09-08 MED ORDER — ISOSORBIDE MONONITRATE ER 30 MG PO TB24
30.0000 mg | ORAL_TABLET | Freq: Every day | ORAL | Status: DC
Start: 2020-09-09 — End: 2020-09-11
  Administered 2020-09-09 – 2020-09-10 (×2): 30 mg via ORAL
  Filled 2020-09-08 (×2): qty 1

## 2020-09-08 MED ORDER — INSULIN ASPART 100 UNIT/ML ~~LOC~~ SOLN
0.0000 [IU] | Freq: Three times a day (TID) | SUBCUTANEOUS | Status: DC
  Administered 2020-09-09 – 2020-09-10 (×2): 1 [IU] via SUBCUTANEOUS

## 2020-09-08 MED ORDER — CALCIUM CARBONATE ANTACID 1250 MG/5ML PO SUSP
500.0000 mg | Freq: Four times a day (QID) | ORAL | Status: DC | PRN
  Administered 2020-09-10: 500 mg via ORAL
  Filled 2020-09-08 (×3): qty 5

## 2020-09-08 MED ORDER — CALCIUM ACETATE (PHOS BINDER) 667 MG PO CAPS
2001.0000 mg | ORAL_CAPSULE | Freq: Two times a day (BID) | ORAL | Status: DC
  Administered 2020-09-09 – 2020-09-10 (×3): 2001 mg via ORAL
  Filled 2020-09-08 (×3): qty 3

## 2020-09-08 MED ORDER — NEPRO/CARBSTEADY PO LIQD
237.0000 mL | Freq: Three times a day (TID) | ORAL | Status: DC | PRN
  Filled 2020-09-08: qty 237

## 2020-09-08 MED ORDER — CAMPHOR-MENTHOL 0.5-0.5 % EX LOTN
1.0000 | TOPICAL_LOTION | Freq: Three times a day (TID) | CUTANEOUS | Status: DC | PRN
Start: 2020-09-08 — End: 2020-09-11
  Filled 2020-09-08: qty 222

## 2020-09-08 MED ORDER — ATORVASTATIN CALCIUM 10 MG PO TABS
10.0000 mg | ORAL_TABLET | Freq: Every day | ORAL | Status: DC
  Administered 2020-09-08 – 2020-09-10 (×3): 10 mg via ORAL
  Filled 2020-09-08 (×3): qty 1

## 2020-09-08 MED ORDER — CHLORHEXIDINE GLUCONATE CLOTH 2 % EX PADS
6.0000 | MEDICATED_PAD | Freq: Every day | CUTANEOUS | Status: DC
  Administered 2020-09-09 – 2020-09-11 (×3): 6 via TOPICAL

## 2020-09-08 MED ORDER — GABAPENTIN 100 MG PO CAPS
200.0000 mg | ORAL_CAPSULE | ORAL | Status: DC
  Administered 2020-09-09 – 2020-09-10 (×6): 200 mg via ORAL
  Filled 2020-09-08 (×6): qty 2

## 2020-09-08 NOTE — H&P (Addendum)
History and Physical    Brandy Houston XHB:[id number] DOB: 1955/09/03 DOA: 09/08/2020  PCP: [name], MD Consultants:  Silverio Decamp - GI; Gardiner Rhyme - cardiology; nephrology Patient coming from:  Home - lives with son; [place]: Son, [phone]   Chief Complaint: Symptomatic anemia  HPI: Brandy Houston is a 65 y.o. female with medical history significant of ESRD on HD; DM; recurrent GI bleeding with h/o gastric and duodenal AVMs; DM; PVD s/p AKA; CAD; and HTN presenting with symptomatic anemia.  She reports that she is here for a blood transfusion.  She has been having GI bleeding for a couple of months.  It has stopped after prior hospitalization and then started again maybe a month ago.  She had diarrhea prior to it starting.  It seemed to stop with Imodium 3 days ago.      ED Course: Spoken to GI and nephrology.   Sent by HD (before Hgb) for anemia, Hgb 4.7.  Had chest pain, troponin high but lower than prior and negative delta, pain resolved, unlikely cardiac.  Has GI bleeding - maroon stool.  Hemodynamically stable currently.  Nephrology planning to transfuse during HD.  Last scope in January, saw AVMs that weren't actively bleeding.   Review of Systems: As per HPI; otherwise review of systems reviewed and negative.   Ambulatory Status:  Users a wheelchair  COVID Vaccine Status:  Complete  Past Medical History:  Diagnosis Date  . Allergy   . Anemia   . Anxiety   . Arthritis    "in my joints" (03/10/2017)  . Chronic diastolic CHF (congestive heart failure) ([employer])   . Depression    Chronic  . Diabetic peripheral neuropathy ([place])   . Dysrhythmia    tachycardia, normal ECHO 08-09-14  . ESRD (end stage renal disease) ([place])   . GERD (gastroesophageal reflux disease)   . Heart murmur     dx'd 02/05/2017  . Hepatitis C    "tx'd in 2016; I'm negative now" (02/05/2017)  . History of blood transfusion 2017   "w/knee replacement"  . Hyperlipidemia   . Hypertension   . Peripheral  neuropathy   . Protein calorie malnutrition ([place])   . Septic arthritis ([place])   . Slow transit constipation   . Symptomatic anemia 01/27/2020  . Type II diabetes mellitus ([name])    IDDM  . Uncontrolled hypertension 02/18/2013    Past Surgical History:  Procedure Laterality Date  . A/V FISTULAGRAM Right 07/07/2018   Procedure: A/V FISTULAGRAM;  Surgeon: Marty Heck, MD;  Location: [place] [facility];  Service: Cardiovascular;  Laterality: Right;  . AMPUTATION Left 03/20/2017   Procedure: LEFT ABOVE KNEE AMPUTATION;  Surgeon: Newt Minion, MD;  Location: [place];  Service: Orthopedics;  Laterality: Left;  . APPENDECTOMY    . AV FISTULA PLACEMENT Left 09/13/2014   Procedure: Brachial Artery to Brachial Vein Gortex Four - Seven Stretch GRAFT INSERTION Left Forearm;  Surgeon: Mal Misty, MD;  Location: [place];  Service: Vascular;  Laterality: Left;  . BASCILIC VEIN TRANSPOSITION Right 10/26/2017   Procedure: BASILIC VEIN TRANSPOSITION FIRST STAGE RIGHT ARM;  Surgeon: Conrad Hillsboro, MD;  Location: [place];  Service: Vascular;  Laterality: Right;  . BASCILIC VEIN TRANSPOSITION Right 02/01/2018   Procedure: SECOND STAGE BASILIC VEIN TRANSPOSITION RIGHT UPPER EXTREMITY;  Surgeon: Marty Heck, MD;  Location: [place];  Service: Vascular;  Laterality: Right;  . CHOLECYSTECTOMY OPEN    . COLON SURGERY    .  COLONOSCOPY    . ENTEROSCOPY N/A 05/28/2020   Procedure: ENTEROSCOPY;  Surgeon: Mauri Pole, MD;  Location: [facility] ENDOSCOPY;  Service: Endoscopy;  Laterality: N/A;  . ENTEROSCOPY N/A 06/25/2020   Procedure: ENTEROSCOPY;  Surgeon: Milus Banister, MD;  Location: [facility]OSCOPY;  Service: Gastroenterology;  Laterality: N/A;  . ESOPHAGOGASTRODUODENOSCOPY (EGD) WITH PROPOFOL N/A 05/14/2020   Procedure: ESOPHAGOGASTRODUODENOSCOPY (EGD) WITH PROPOFOL;  Surgeon: Mauri Pole, MD;  Location: [facility] ENDOSCOPY;  Service: Endoscopy;  Laterality: N/A;  with APC  . EXCISIONAL TOTAL KNEE  ARTHROPLASTY WITH ANTIBIOTIC SPACERS Left 02/06/2017   Procedure: Incisional total iknee with antibiotic spacer ;  Surgeon: Leandrew Koyanagi, MD;  Location: [place];  Service: Orthopedics;  Laterality: Left;  . HOT HEMOSTASIS N/A 05/14/2020   Procedure: HOT HEMOSTASIS (ARGON PLASMA COAGULATION/BICAP);  Surgeon: Mauri Pole, MD;  Location: [facility];  Service: Endoscopy;  Laterality: N/A;  . HOT HEMOSTASIS N/A 05/28/2020   Procedure: HOT HEMOSTASIS (ARGON PLASMA COAGULATION/BICAP);  Surgeon: Mauri Pole, MD;  Location: [facility] ENDOSCOPY;  Service: Endoscopy;  Laterality: N/A;  . HOT HEMOSTASIS N/A 06/25/2020   Procedure: HOT HEMOSTASIS (ARGON PLASMA COAGULATION/BICAP);  Surgeon: Milus Banister, MD;  Location: [facility] ENDOSCOPY;  Service: Gastroenterology;  Laterality: N/A;  . INTRAVASCULAR PRESSURE WIRE/FFR STUDY N/A 04/13/2020   Procedure: INTRAVASCULAR PRESSURE WIRE/FFR STUDY;  Surgeon: Burnell Blanks, MD;  Location: [place] [facility];  Service: Cardiovascular;  Laterality: N/A;  . IR FLUORO GUIDE CV LINE RIGHT  02/10/2017  . IR REMOVAL TUN CV CATH W/O FL  04/01/2017  . IR US GUIDE VASC ACCESS RIGHT  02/10/2017  . IRRIGATION AND DEBRIDEMENT KNEE Left 03/12/2017   Procedure: IRRIGATION AND DEBRIDEMENT LEFT KNEE WITH WOUND VAC APPLICATION;  Surgeon: Leandrew Koyanagi, MD;  Location: [place];  Service: Orthopedics;  Laterality: Left;  . JOINT REPLACEMENT    . KNEE ARTHROSCOPY Right 08/10/2014   Procedure: ARTHROSCOPY I & D KNEE;  Surgeon: Marianna Payment, MD;  [facility]: [facility];  Service: Orthopedics;  Laterality: Right;  . KNEE ARTHROSCOPY Left 08/11/2014   Procedure: ARTHROSCOPIC WASHOUT LEFT KNEE;  Surgeon: Marianna Payment, MD;  Location: [place];  Service: Orthopedics;  Laterality: Left;  . KNEE ARTHROSCOPY Left 08/19/2014   Procedure: ARTHROSCOPIC WASHOUT LEFT KNEE;  Surgeon: Leandrew Koyanagi, MD;  Location: [place];  Service: Orthopedics;  Laterality: Left;  . KNEE ARTHROSCOPY WITH LATERAL  MENISECTOMY Left 04/04/2015   Procedure: AND PARTIAL LATERAL MENISECTOMY;  Surgeon: Leandrew Koyanagi, MD;  Location: [place];  Service: Orthopedics;  Laterality: Left;  . KNEE ARTHROSCOPY WITH MEDIAL MENISECTOMY Left 04/04/2015   Procedure: LEFT KNEE ARTHROSCOPY WITH PARTIAL MEDIAL MENISCECTOMY  AND SYNOVECTOMY;  Surgeon: Leandrew Koyanagi, MD;  Location: [place];  Service: Orthopedics;  Laterality: Left;  . PERIPHERAL VASCULAR BALLOON ANGIOPLASTY  07/07/2018   Procedure: PERIPHERAL VASCULAR BALLOON ANGIOPLASTY;  Surgeon: Marty Heck, MD;  Location: [place] [facility];  Service: Cardiovascular;;  right AV fistula  . POLYPECTOMY    . RIGHT/LEFT HEART CATH AND CORONARY ANGIOGRAPHY N/A 04/13/2020   Procedure: RIGHT/LEFT HEART CATH AND CORONARY ANGIOGRAPHY;  Surgeon: Burnell Blanks, MD;  Location: [place] [facility];  Service: Cardiovascular;  Laterality: N/A;  . SHOULDER ARTHROSCOPY Bilateral 08/10/2014   Procedure: I & D BILATERAL SHOULDERS ;  Surgeon: Marianna Payment, MD;  Location: [facility];  Service: Orthopedics;  Laterality: Bilateral;  . SMALL INTESTINE SURGERY  Due to Small Bowel Obstruction; "fixed it when they did my gallbladder OR"  . TEE WITHOUT CARDIOVERSION N/A 08/14/2014   Procedure: TRANSESOPHAGEAL ECHOCARDIOGRAM (TEE);  Surgeon: Thayer Headings, MD;  Location: [place];  Service: Cardiovascular;  Laterality: N/A;  . TENOSYNOVECTOMY Right 08/11/2014   Procedure: RIGHT WRIST IRRIGATION AND DEBRIDEMENT, TENOSYNOVECTOMY;  Surgeon: Marianna Payment, MD;  Location: [place];  Service: Orthopedics;  Laterality: Right;  . TOTAL KNEE ARTHROPLASTY Left 11/07/2015   Procedure: LEFT TOTAL KNEE ARTHROPLASTY WITH REVISION OF IMPLANTS;  Surgeon: Leandrew Koyanagi, MD;  Location: [place];  Service: Orthopedics;  Laterality: Left;  . TUBAL LIGATION      Social History   Socioeconomic History  . Marital status: Single    Spouse name: Not on file  . Number  of children: 3  . Years of education: Not on file  . Highest education level: Not on file  Occupational History  . Occupation: disabled  Tobacco Use  . Smoking status: Current Every Day Smoker    Packs/day: 0.50    Years: 45.00    Pack years: 22.50    Types: Cigarettes  . Smokeless tobacco: Never Used  Vaping Use  . Vaping Use: Never used  Substance and Sexual Activity  . Alcohol use: Not Currently    Alcohol/week: 0.0 standard drinks    Comment: 03/10/2017 "I drink a wine cooler a few times/year"  . Drug use: No  . Sexual activity: Not Currently  Other Topics Concern  . Not on file  Social History Narrative  . Not on file   Social Determinants of Health   Financial Resource Strain: Not on file  Food Insecurity: Not on file  Transportation Needs: Not on file  Physical Activity: Not on file  Stress: Not on file  Social Connections: Not on file  Intimate Partner Violence: Not on file    Allergies  Allergen Reactions  . Compazine [Prochlorperazine] Shortness Of Breath, Swelling and Other (See Comments)    TONGUE SWELLS  . Shellfish-Derived Products Anaphylaxis  . Iodinated Diagnostic Agents Hives and Rash  . Omnipaque [Iohexol] Hives  . Tape     Brown paper tape caused skin irritation  . Sulfa Antibiotics Rash    Family History  Problem Relation Age of Onset  . Uterine cancer Mother   . Heart disease Father   . Hypertension Sister   . Uterine cancer Sister   . Cancer Brother        unsure type  . Diabetes Brother   . Hypertension Brother   . Colon cancer Neg Hx   . Colon polyps Neg Hx   . Esophageal cancer Neg Hx   . Rectal cancer Neg Hx   . Stomach cancer Neg Hx     Prior to Admission medications   Medication Sig Start Date End Date Taking? Authorizing Provider  Accu-Chek Softclix Lancets lancets Check sugars three times a day for E11.65 09/20/18   [name], MD  acetaminophen (TYLENOL) 500 MG tablet Take 500-1,000 mg by mouth every 6 (six) hours  as needed (for pain.).    [provider]  ascorbic acid (VITAMIN C) 500 MG tablet Take 1 tablet (500 mg total) by mouth daily. 06/25/20   Samuella Cota, MD  atorvastatin (LIPITOR) 10 MG tablet Take 1 tablet (10 mg total) by mouth at bedtime. 06/13/20   [name], MD  blood glucose meter kit and supplies KIT Dispense based on patient and insurance preference. Use  up to four times daily as directed 10/13/19   [name], MD  Blood Glucose Monitoring Suppl (ACCU-CHEK GUIDE) w/Device KIT 1 Device by Does not apply route daily. 10/31/19   [name], MD  calcium acetate (PHOSLO) 667 MG capsule Take 1,334-2,001 mg by mouth See admin instructions. Take 3 capsules (2001 mg) by mouth with breakfast, take 2 capsules (1334 mg) by mouth with lunch, take 3 capsules (2001 mg) by mouth with supper & take 2 capsules (1334 mg) by mouth with snacks. 01/13/19   [provider]  carvedilol (COREG) 3.125 MG tablet Take 1 tablet (3.125 mg total) by mouth 2 (two) times daily. 04/04/20 07/03/20  Donato Heinz, MD  diltiazem 2 % GEL Apply 1 application topically 3 (three) times daily. Apply to affected area on anus 06/25/20   Samuella Cota, MD  diphenhydrAMINE (BENADRYL) 25 mg capsule Take 25 mg by mouth every 6 (six) hours as needed for allergies.    [provider]  famotidine (PEPCID) 40 MG tablet Take 1 tablet (40 mg total) by mouth daily. 08/24/20   Noralyn Pick, NP  gabapentin (NEURONTIN) 100 MG capsule Take 200 mg by mouth See admin instructions. Take 2 capsules (200 mg) by mouth 3 times on Sundays, Mondays, Wednesdays, & Fridays. 11/01/18   [provider]  gabapentin (NEURONTIN) 300 MG capsule Take 300 mg by mouth See admin instructions. Take 1 capsule (300 mg) by mouth twice daily on Tuesdays, Thursdays, & Saturdays.    [provider]  glucose blood (ACCU-CHEK GUIDE) test strip Check blood sugar up to 4 times a day 10/20/19   [name], MD  hydrALAZINE (APRESOLINE) 100 MG tablet Take 100 mg by mouth See admin instructions. TID on non-dialysis days.( m,w,f,sun)    [provider]  hydrocortisone (ANUSOL-HC) 25 MG suppository Place 1 suppository (25 mg total) rectally 2 (two) times daily. 06/25/20   Samuella Cota, MD  Iron, Ferrous Sulfate, 325 (65 Fe) MG TABS Take 325 mg by mouth daily. Note that this medication will turn stool black and may cause constipation. 06/25/20   Samuella Cota, MD  isosorbide mononitrate (IMDUR) 30 MG 24 hr tablet Take 1 tablet (30 mg total) by mouth daily. 07/16/20 10/14/20  Donato Heinz, MD  Lancet Devices [facility]) lancets Check sugars TID for E11.65 01/29/15   Lance Bosch, NP  lidocaine-prilocaine (EMLA) cream Apply 1 application topically Every Tuesday,Thursday,and Saturday with dialysis. On days of dialysis 3x week 10/25/18   [provider]  methocarbamol (ROBAXIN) 500 MG tablet Take 500 mg by mouth every 8 (eight) hours as needed for muscle spasms.  06/08/19   [provider]  misoprostol (CYTOTEC) 100 MCG tablet TAKE 1 TABLET BY MOUTH EVERY 6 HOURS 08/31/20   Fayrene Helper, MD  multivitamin (RENA-VIT) TABS tablet Take 1 tablet by mouth daily. 03/01/19   [provider]  nitroGLYCERIN (NITROSTAT) 0.4 MG SL tablet Place 1 tablet (0.4 mg total) under the tongue every 5 (five) minutes as needed. 04/25/20 07/24/20  Donato Heinz, MD  pantoprazole (PROTONIX) 20 MG tablet Take 20 mg by mouth 2 (two) times daily. 07/14/20   [provider]  pantoprazole (PROTONIX) 40 MG tablet Take 1 tablet (40 mg total) by mouth daily. 40 mg BID for 2 months & then once daily 08/03/20 10/02/20  [name], MD  polyethylene glycol (MIRALAX / GLYCOLAX) 17 g packet Take 17 g by mouth daily  as needed for moderate constipation. 12/10/18   [name], MD  predniSONE (DELTASONE) 20 MG tablet Take by mouth. 08/06/20   [provider]  sertraline (ZOLOFT) 100 MG tablet Take 2 tablets (200 mg total) by mouth daily. 06/13/20   [name], MD  traZODone (DESYREL) 50 MG tablet Take 50 mg by mouth at bedtime. 10/27/18   [provider]  UNABLE TO FIND Prosthetic socket, from [facility]: Lambert Rhonda W 08/03/20   [name], MD    Physical Exam: Vitals:   09/08/20 1438 09/08/20 1500 09/08/20 1600 09/08/20 1716  BP: (!) 118/53 128/62 125/61 129/66  Pulse: 92 93 93 94  Resp: '18 11 14 16  ' Temp:    98.3 F (36.8 C)  TempSrc:    Oral  SpO2: 100% 94% 98%   Weight:      Height:         . General:  Appears calm and comfortable and is in NAD; she is fidgeting continuously and reported to the nurse that she has not had her Gabapentin today.  She is also asking if she can have food. . Eyes:  PERRL, EOMI, normal lids, iris . ENT:  grossly normal hearing, lips & tongue, mmm; edentulous . Neck:  no LAD, masses or thyromegaly . Cardiovascular:  RRR, no r/g, 4/6 systolic murmur. No LE edema.  [name] Respiratory:   CTA bilaterally with no wheezes/rales/rhonchi.  Normal respiratory effort. . Abdomen:  soft, diffusely TTP but particularly in epigastric region and LLQ, ND . Skin:  no rash or induration seen on limited exam . Musculoskeletal:  grossly normal tone BUE/BLE, good ROM, no bony abnormality; s/p L AKA and stump is C/D/I . Psychiatric:  grossly normal mood and affect, speech fluent and appropriate, AOx3 . Neurologic:  CN 2-12 grossly intact, moves all extremities in coordinated fashion    Radiological Exams on Admission: Independently reviewed - see discussion in A/P where applicable  DG Chest Port 1 View  Result Date: 09/08/2020 CLINICAL DATA:  Chest pain. EXAM: PORTABLE CHEST 1 VIEW COMPARISON:  10/22/2019 FINDINGS: Marked cardiac enlargement. Aortic atherosclerosis. Small left pleural effusion is new from previous exam. Mild diffuse pulmonary vascular congestion. IMPRESSION: 1. New small left pleural  effusion. 2. Cardiac enlargement and pulmonary vascular congestion. 3.  Aortic Atherosclerosis (ICD10-I70.0). Electronically Signed   By: Kerby Moors M.D.   On: 09/08/2020 13:22    EKG: Independently reviewed.  NSR with rate 93; low voltage, nonspecific ST changes with no evidence of STEMI   Labs on Admission: I have personally reviewed the available labs and imaging studies at the time of the admission.  Pertinent labs:   Glucose 127 BUN 38/Creatinine 4.77/GFR 10 HS troponin 338 -> 296 WBC 6.5 Hgb 4.7; 11.1 on 3/11    Assessment/Plan Principal Problem:   Acute upper GI bleeding Active Problems:   End stage renal disease ([name])   Major depression, recurrent, chronic ([name])   DM type 2 causing ESRD ([name])   Amputation above knee ([name])   ABLA (acute blood loss anemia)   Essential hypertension   Coronary artery disease of native artery of native heart with stable angina pectoris ([name])   Dyslipidemia    Acute GI Bleeding -Patient is presenting with melena which she reports has been present for about a month following prior interventions in January -She has had recurrent GI bleeding; most recently, she had enteroscopy on 06/25/20 which showed esophageal candidiasis; 2 non-bleeding AVMs in the duodenal bulb and proximal  jejunum that were treated with APC; and mild portal gastropathy -She returned today after HD called her and said she was anemic; she reports mild generalized abdominal pain as well as chest pain that has since resolved -The patient is not tachycardic with normal blood pressure, suggesting subacute volume loss.  -Will admit to temeletry bed  -GI consulted by ED, will follow up recommendations -NPO for possible EGD -Start IV pantoprazole bolus and infusion given ongoing bleeding with need for semi-urgent EGD -Zofran IV for nausea -Avoid NSAIDs and SQ heparin -Maintain IV access (2 large bore IVs if possible). -She is on Cytotec, possibly as gastric protectant? Will  hold and defer to GI  ABLA -Patient's chest pain was most likely caused by anemia secondary to upper GI bleeding.  -Her Hgb decreased from 11.1 on 3/11 to 4.7 today.  -Type and screen were done in ED.  -Two units of blood were ordered by ED.  -Monitor closely and follow cbc q12h, transfuse as necessary for Hbg <7   ESRD on TTS HD -Patient on chronic TTS HD -Nephrology prn order set utilized -She will need HD given that she did not get it today and because she will be receiving blood products -Dr. Jonnie Finner is aware that patient will need HD -Continue Phoslo, Rena-Vit  DM -Last A1c was <5  -She may no longer need to carry this diagnosis -For now, will order very sensitive-scale SSI without qhs coverage  CAD/PVD -s/p L AKA -Continue Imdur -Continue Neurontin, Robaxin -She has prednisone listed on her prior medication list (not reviewed with pharmacy) without clear indication and she does not appear to have been taking this recently based on clinic notes - will hold for now  HLD -Continue Lipitor  HTN -Previously on Coreg but this does not appear to have been continued from last hospitalization - needs reconsideration once more stable -Continue hydralazine on non-HD days  Recent COVID infection -Patient testing positive on 06/20/20 and so does not require repeat testing at this time  Mood d/o -Continue Zoloft, trazodone    DVT prophylaxis: SCDs Code Status:  FULL - confirmed with patient Family Communication: None present  Disposition Plan:  The patient is from: home  Anticipated d/c is to: home without [facility] services   Anticipated d/c date will depend on clinical response to treatment, likely several days  Patient is currently: acutely ill Consults called: GI; Nephrology Admission status: Admit - It is my clinical opinion that admission to [facility] is reasonable and necessary because of the expectation that this patient will require hospital care that crosses at least 2  midnights to treat this condition based on the medical complexity of the problems presented.  Given the aforementioned information, the predictability of an adverse outcome is felt to be significant.      Karmen Bongo MD [facility]   How to contact the [facility] Attending or Consulting provider [place] or covering provider during after hours [place], for this patient?  1. Check the care team in [facility] and look for a) attending/consulting [facility] provider listed and b) the [facility] team listed 2. Log into [email or web address] and use [place]'s universal password to access. If you do not have the password, please contact the hospital operator. 3. Locate the [facility] provider you are looking for under [facility] and page to a number that you can be directly reached. 4. If you still have difficulty reaching the provider, please page the [facility] (Director on Call) for the Hospitalists listed on  amion for assistance.   09/08/2020, 5:18 PM

## 2020-09-08 NOTE — ED Notes (Signed)
Lab called with abnormal lab values of hemaglobin of 4.7.  Doctor notified.

## 2020-09-08 NOTE — Progress Notes (Signed)
Patient arrived to unit from ED. Complaining of pain at IV site. ED nurses and writer assessed, no signs of infiltration. Patient anxious and tearful at times. Provided reassurance. Because blood was infusing, placed IV protonix on hold. Patient picked up for dialysis at 1845. Dialysis to complete blood infusions. Will notify next shift to continue to monitor IV access when patient arrives back to unit.

## 2020-09-08 NOTE — Consult Note (Signed)
CONSULT NOTE FOR [facility]  Reason for Consult: Melena/hematochezia/anemia Referring Physician: [facility]  [name] HPI: This is a 65 year old female with a PMH of recurrent GI bleeding, ESRD, esophagitis, GERD, HTN, DM, and s/p left AKA admitted for GI bleeding.  The history is difficult to obtain from the patient.  She states that since she was discharged in January for her GI bleeding she reports constant bleeding, but then she also said that she was not bleeding for a couple of weeks.  Her bleeding varies between "coffee-grounds", dark stool, and hematochezia.  She had extensive work up with multiple EGDs/enteroscopies and colonoscopies.  She was identified at times to have varying degrees of esophagitis and some gastric and small bowel AVMs that were ablated with APC.  Today she presents with an HGB of 4.7 g/dL and her baseline is between 7-8 g/dL.  A bleeding scan on 06/21/2020 was negative for any source of bleeding.  She does not recall undergoing a VCE.  She complained about having diarrhea of late, but it is unclear if the diarrhea was continuous from her last hospitalization or it was more acute.  With this anemia she did complain about some chest pain.  Past Medical History:  Diagnosis Date  . Allergy   . Anemia   . Anxiety   . Arthritis    "in my joints" (03/10/2017)  . Chronic diastolic CHF (congestive heart failure) ([place])   . Depression    Chronic  . Diabetic peripheral neuropathy ([place])   . Dysrhythmia    tachycardia, normal ECHO 08-09-14  . ESRD (end stage renal disease) ([place])   . GERD (gastroesophageal reflux disease)   . Heart murmur     dx'd 02/05/2017  . Hepatitis C    "tx'd in 2016; I'm negative now" (02/05/2017)  . History of blood transfusion 2017   "w/knee replacement"  . Hyperlipidemia   . Hypertension   . Peripheral neuropathy   . Protein calorie malnutrition ([place])   . Septic arthritis ([place])   . Slow transit constipation   . Symptomatic anemia  01/27/2020  . Type II diabetes mellitus ([name])    IDDM  . Uncontrolled hypertension 02/18/2013    Past Surgical History:  Procedure Laterality Date  . A/V FISTULAGRAM Right 07/07/2018   Procedure: A/V FISTULAGRAM;  Surgeon: Marty Heck, MD;  Location: [facility];  Service: Cardiovascular;  Laterality: Right;  . AMPUTATION Left 03/20/2017   Procedure: LEFT ABOVE KNEE AMPUTATION;  Surgeon: Newt Minion, MD;  Location: [facility];  Service: Orthopedics;  Laterality: Left;  . APPENDECTOMY    . AV FISTULA PLACEMENT Left 09/13/2014   Procedure: Brachial Artery to Brachial Vein Gortex Four - Seven Stretch GRAFT INSERTION Left Forearm;  Surgeon: Mal Misty, MD;  Location: [facility];  Service: Vascular;  Laterality: Left;  . BASCILIC VEIN TRANSPOSITION Right 10/26/2017   Procedure: BASILIC VEIN TRANSPOSITION FIRST STAGE RIGHT ARM;  Surgeon: Conrad Westchester, MD;  Location: [place] [facility];  Service: Vascular;  Laterality: Right;  . BASCILIC VEIN TRANSPOSITION Right 02/01/2018   Procedure: SECOND STAGE BASILIC VEIN TRANSPOSITION RIGHT UPPER EXTREMITY;  Surgeon: Marty Heck, MD;  Location: [facility];  Service: Vascular;  Laterality: Right;  . CHOLECYSTECTOMY OPEN    . COLON SURGERY    . COLONOSCOPY    . ENTEROSCOPY N/A 05/28/2020   Procedure: ENTEROSCOPY;  Surgeon: Mauri Pole, MD;  Location: [facility] ENDOSCOPY;  Service: Endoscopy;  Laterality: N/A;  .  ENTEROSCOPY N/A 06/25/2020   Procedure: ENTEROSCOPY;  Surgeon: Milus Banister, MD;  Location: [facility] ENDOSCOPY;  Service: Gastroenterology;  Laterality: N/A;  . ESOPHAGOGASTRODUODENOSCOPY (EGD) WITH PROPOFOL N/A 05/14/2020   Procedure: ESOPHAGOGASTRODUODENOSCOPY (EGD) WITH PROPOFOL;  Surgeon: Mauri Pole, MD;  Location: [facility] ENDOSCOPY;  Service: Endoscopy;  Laterality: N/A;  with APC  . EXCISIONAL TOTAL KNEE ARTHROPLASTY WITH ANTIBIOTIC SPACERS Left 02/06/2017   Procedure: Incisional total iknee with antibiotic spacer ;  Surgeon: Leandrew Koyanagi,  MD;  Location: [place];  Service: Orthopedics;  Laterality: Left;  . HOT HEMOSTASIS N/A 05/14/2020   Procedure: HOT HEMOSTASIS (ARGON PLASMA COAGULATION/BICAP);  Surgeon: Mauri Pole, MD;  Location: [facility];  Service: Endoscopy;  Laterality: N/A;  . HOT HEMOSTASIS N/A 05/28/2020   Procedure: HOT HEMOSTASIS (ARGON PLASMA COAGULATION/BICAP);  Surgeon: Mauri Pole, MD;  Location: [facility]COPY;  Service: Endoscopy;  Laterality: N/A;  . HOT HEMOSTASIS N/A 06/25/2020   Procedure: HOT HEMOSTASIS (ARGON PLASMA COAGULATION/BICAP);  Surgeon: Milus Banister, MD;  Location[facility];  Service: Gastroenterology;  Laterality: N/A;  . INTRAVASCULAR PRESSURE WIRE/FFR STUDY N/A 04/13/2020   Procedure: INTRAVASCULAR PRESSURE WIRE/FFR STUDY;  Surgeon: Burnell Blanks, MD;  Location: [facility];  Service: Cardiovascular;  Laterality: N/A;  . IR FLUORO GUIDE CV LINE RIGHT  02/10/2017  . IR REMOVAL TUN CV CATH W/O FL  04/01/2017  . IR US GUIDE VASC ACCESS RIGHT  02/10/2017  . IRRIGATION AND DEBRIDEMENT KNEE Left 03/12/2017   Procedure: IRRIGATION AND DEBRIDEMENT LEFT KNEE WITH WOUND VAC APPLICATION;  Surgeon: Leandrew Koyanagi, MD;  Location: [facility];  Service: Orthopedics;  Laterality: Left;  . JOINT REPLACEMENT    . KNEE ARTHROSCOPY Right 08/10/2014   Procedure: ARTHROSCOPY I & D KNEE;  Surgeon: Marianna Payment, MD;  Location: [facility];  Service: Orthopedics;  Laterality: Right;  . KNEE ARTHROSCOPY Left 08/11/2014   Procedure: ARTHROSCOPIC WASHOUT LEFT KNEE;  Surgeon: Marianna Payment, MD;  Location: [place];  Service: Orthopedics;  Laterality: Left;  . KNEE ARTHROSCOPY Left 08/19/2014   Procedure: ARTHROSCOPIC WASHOUT LEFT KNEE;  Surgeon: Leandrew Koyanagi, MD;  Location: [place];  Service: Orthopedics;  Laterality: Left;  . KNEE ARTHROSCOPY WITH LATERAL MENISECTOMY Left 04/04/2015   Procedure: AND PARTIAL LATERAL MENISECTOMY;  Surgeon: Leandrew Koyanagi, MD;  Location: [facility];   Service: Orthopedics;  Laterality: Left;  . KNEE ARTHROSCOPY WITH MEDIAL MENISECTOMY Left 04/04/2015   Procedure: LEFT KNEE ARTHROSCOPY WITH PARTIAL MEDIAL MENISCECTOMY  AND SYNOVECTOMY;  Surgeon: Leandrew Koyanagi, MD;  Location: [facility];  Service: Orthopedics;  Laterality: Left;  . PERIPHERAL VASCULAR BALLOON ANGIOPLASTY  07/07/2018   Procedure: PERIPHERAL VASCULAR BALLOON ANGIOPLASTY;  Surgeon: Marty Heck, MD;  Location: [facility];  Service: Cardiovascular;;  right AV fistula  . POLYPECTOMY    . RIGHT/LEFT HEART CATH AND CORONARY ANGIOGRAPHY N/A 04/13/2020   Procedure: RIGHT/LEFT HEART CATH AND CORONARY ANGIOGRAPHY;  Surgeon: Burnell Blanks, MD;  Location: [facility];  Service: Cardiovascular;  Laterality: N/A;  . SHOULDER ARTHROSCOPY Bilateral 08/10/2014   Procedure: I & D BILATERAL SHOULDERS ;  Surgeon: Marianna Payment, MD;  Location: [facility];  Service: Orthopedics;  Laterality: Bilateral;  . SMALL INTESTINE SURGERY     Due to Small Bowel Obstruction; "fixed it when they did my gallbladder OR"  . TEE WITHOUT CARDIOVERSION N/A 08/14/2014   Procedure: TRANSESOPHAGEAL ECHOCARDIOGRAM (TEE);  Surgeon: Wonda Cheng  Nahser, MD;  Location: [facility];  Service: Cardiovascular;  Laterality: N/A;  . TENOSYNOVECTOMY Right 08/11/2014   Procedure: RIGHT WRIST IRRIGATION AND DEBRIDEMENT, TENOSYNOVECTOMY;  Surgeon: Marianna Payment, MD;  Location: [place];  Service: Orthopedics;  Laterality: Right;  . TOTAL KNEE ARTHROPLASTY Left 11/07/2015   Procedure: LEFT TOTAL KNEE ARTHROPLASTY WITH REVISION OF IMPLANTS;  Surgeon: Leandrew Koyanagi, MD;  Location: [facility];  Service: Orthopedics;  Laterality: Left;  . TUBAL LIGATION      Family History  Problem Relation Age of Onset  . Uterine cancer Mother   . Heart disease Father   . Hypertension Sister   . Uterine cancer Sister   . Cancer Brother        unsure type  . Diabetes Brother   . Hypertension Brother   . Colon  cancer Neg Hx   . Colon polyps Neg Hx   . Esophageal cancer Neg Hx   . Rectal cancer Neg Hx   . Stomach cancer Neg Hx     Social History:  reports that she has been smoking cigarettes. She has a 22.50 pack-year smoking history. She has never used smokeless tobacco. She reports previous alcohol use. She reports that she does not use drugs.  Allergies:  Allergies  Allergen Reactions  . Compazine [Prochlorperazine] Shortness Of Breath, Swelling and Other (See Comments)    TONGUE SWELLS  . Shellfish-Derived Products Anaphylaxis  . Iodinated Diagnostic Agents Hives and Rash  . Omnipaque [Iohexol] Hives  . Tape Other (See Comments)    [place]ark, paper tape caused skin irritation  . Sulfa Antibiotics Rash    Medications:  Scheduled: . atorvastatin  10 mg Oral QHS  . [START ON 09/09/2020] calcium acetate  1,334 mg Oral Q lunch  . calcium acetate  2,001 mg Oral BID WC  . [START ON 09/09/2020] Chlorhexidine Gluconate Cloth  6 each Topical Q0600  . [START ON 09/09/2020] gabapentin  200 mg Oral 3 times per day on Sun Mon Wed Fri  . gabapentin  300 mg Oral 2 times per day on Tue Thu Sat  . [START ON 09/09/2020] hydrALAZINE  100 mg Oral 3 times per day on Sun Mon Wed Fri  . insulin aspart  0-6 Units Subcutaneous TID WC  . [START ON 09/09/2020] isosorbide mononitrate  30 mg Oral Daily  . [START ON 09/09/2020] multivitamin  1 tablet Oral Daily  . [START ON 09/12/2020] pantoprazole  40 mg Intravenous Q12H  . [START ON 09/09/2020] sertraline  200 mg Oral Daily  . sodium chloride flush  3 mL Intravenous Q12H  . traZODone  50 mg Oral QHS   Continuous: . pantoprozole (PROTONIX) infusion Stopped (09/08/20 1836)  . pantoprazole (PROTONIX) 80 mg IVPB Stopped (09/08/20 1836)    Results for orders placed or performed during the hospital encounter of 09/08/20 (from the past 24 hour(s))  Basic metabolic panel     Status: Abnormal   Collection Time: 09/08/20 10:50 AM  Result Value Ref Range   Sodium 135 135 -  145 mmol/L   Potassium 4.0 3.5 - 5.1 mmol/L   Chloride 96 (L) 98 - 111 mmol/L   CO2 27 22 - 32 mmol/L   Glucose, Bld 176 (H) 70 - 99 mg/dL   BUN 37 (H) 8 - 23 mg/dL   Creatinine, Ser 4.71 (H) 0.44 - 1.00 mg/dL   Calcium 8.6 (L) 8.9 - 10.3 mg/dL   GFR, Estimated 10 (L) >60 mL/min   Anion gap 12 5 -  15  Type and screen [place]     Status: None (Preliminary result)   Collection Time: 09/08/20 10:50 AM  Result Value Ref Range   ABO/RH(D) A POS    Antibody Screen NEG    Sample Expiration 09/11/2020,2359    Unit Number [id number]    Blood Component Type RED CELLS,LR    Unit division 00    Status of Unit ISSUED    Transfusion Status OK TO TRANSFUSE    Crossmatch Result Compatible    Unit Number [id number]    Blood Component Type RED CELLS,LR    Unit division 00    Status of Unit ISSUED    Transfusion Status OK TO TRANSFUSE    Crossmatch Result      Compatible Performed at [facility], [place] [address], [place], [place] [address]   Troponin I (High Sensitivity)     Status: Abnormal   Collection Time: 09/08/20 10:50 AM  Result Value Ref Range   Troponin I (High Sensitivity) 339 (HH) <18 ng/L  Troponin I (High Sensitivity)     Status: Abnormal   Collection Time: 09/08/20 12:21 PM  Result Value Ref Range   Troponin I (High Sensitivity) 296 (HH) <18 ng/L  CBC with Differential     Status: Abnormal   Collection Time: 09/08/20 12:21 PM  Result Value Ref Range   WBC 6.5 4.0 - 10.5 K/uL   RBC 1.65 (L) 3.87 - 5.11 MIL/uL   Hemoglobin 4.7 (LL) 12.0 - 15.0 g/dL   HCT 15.4 (L) 36.0 - 46.0 %   MCV 93.3 80.0 - 100.0 fL   MCH 28.5 26.0 - 34.0 pg   MCHC 30.5 30.0 - 36.0 g/dL   RDW 18.4 (H) 11.5 - 15.5 %   Platelets 234 150 - 400 K/uL   nRBC 0.0 0.0 - 0.2 %   Neutrophils Relative % 80 %   Neutro Abs 5.3 1.7 - 7.7 K/uL   Lymphocytes Relative 11 %   Lymphs Abs 0.7 0.7 - 4.0 K/uL   Monocytes Relative 8 %   Monocytes Absolute 0.5 0.1 - 1.0 K/uL    Eosinophils Relative 0 %   Eosinophils Absolute 0.0 0.0 - 0.5 K/uL   Basophils Relative 0 %   Basophils Absolute 0.0 0.0 - 0.1 K/uL   Immature Granulocytes 1 %   Abs Immature Granulocytes 0.04 0.00 - 0.07 K/uL  Basic metabolic panel     Status: Abnormal   Collection Time: 09/08/20 12:21 PM  Result Value Ref Range   Sodium 135 135 - 145 mmol/L   Potassium 4.1 3.5 - 5.1 mmol/L   Chloride 95 (L) 98 - 111 mmol/L   CO2 31 22 - 32 mmol/L   Glucose, Bld 127 (H) 70 - 99 mg/dL   BUN 38 (H) 8 - 23 mg/dL   Creatinine, Ser 4.77 (H) 0.44 - 1.00 mg/dL   Calcium 8.6 (L) 8.9 - 10.3 mg/dL   GFR, Estimated 10 (L) >60 mL/min   Anion gap 9 5 - 15  Prepare RBC (crossmatch)     Status: None   Collection Time: 09/08/20  3:45 PM  Result Value Ref Range   Order Confirmation      ORDER PROCESSED BY BLOOD BANK Performed at [facility], [address], [place],  [address]   POC occult blood, ED     Status: Abnormal   Collection Time: 09/08/20  4:50 PM  Result Value Ref Range   Fecal Occult  Bld POSITIVE (A) NEGATIVE     DG Chest Port 1 View  Result Date: 09/08/2020 CLINICAL DATA:  Chest pain. EXAM: PORTABLE CHEST 1 VIEW COMPARISON:  10/22/2019 FINDINGS: Marked cardiac enlargement. Aortic atherosclerosis. Small left pleural effusion is new from previous exam. Mild diffuse pulmonary vascular congestion. IMPRESSION: 1. New small left pleural effusion. 2. Cardiac enlargement and pulmonary vascular congestion. 3.  Aortic Atherosclerosis (ICD10-I70.0). Electronically Signed   By: Kerby Moors M.D.   On: 09/08/2020 13:22    ROS:  As stated above in the HPI otherwise negative.  Blood pressure 119/62, pulse 86, temperature 98.2 F (36.8 C), temperature source Oral, resp. rate 11, height 5\' 7"  (1.702 m), weight 58.1 kg, last menstrual period 03/16/2002, SpO2 100 %.    PE: Gen: NAD, Alert and Oriented HEENT:  [place]/AT, EOMI Neck: Supple, no LAD Lungs: CTA Bilaterally CV: RRR without M/G/R ABD:  Soft, NTND, +BS Ext: No C/C/E  Assessment/Plan: 1) Severe anemia. 2) Recurrent GI bleeding. 3) History of AVMs. 4) History of diverticula.   The patient is presenting again with severe anemia.  The source of the bleeding is not clear.  Her hematochezia complaint for this hospitalization was similar to the last presentation.  It can be consistent with a diverticular bleed, but she is known to have AVMs.  Her reports of bleeding vary between hematochezia and coffee-grounds.  Plan: 1) VCE.  This may help to isolate the source of bleeding - upper GI/small bowel versus colonic. 2) Agree with the transfusions.  Ceana Fiala D 09/08/2020, 9:54 PM

## 2020-09-08 NOTE — Progress Notes (Signed)
Patient returned to unit from dialysis via bed transport

## 2020-09-08 NOTE — ED Provider Notes (Signed)
[facility] EMERGENCY DEPARTMENT Provider Note   CSN: [id number] Arrival date & time: 09/08/20  7062     History Chief Complaint  Patient presents with  . Chest Pain  . Abnormal Lab    Brandy Houston is a 65 y.o. female.  Patient is a dialysis patient.  Normally gets dialysis Tuesday Thursdays and Saturdays.  Patient was at dialysis this morning.  They noted that her hemoglobin was in the 5 range and that she was complaining of chest pain so she was sent here.  Patient did not have any dialysis done today.  Patient's was admitted January 5 for significant anemia turned out to be GI bleed.  She was scoped by the [facility] gastroenterology at that time she had some AVMs and is known to have AVMs but they were not bleeding at that time.  Patient states that she is been having blood in her bowel movements for a few days.  Chest pain has now resolved.  Patient hemodynamically stable here.  Not tachycardic no hypotension.        Past Medical History:  Diagnosis Date  . Allergy   . Anemia   . Anxiety   . Arthritis    "in my joints" (03/10/2017)  . Chronic diastolic CHF (congestive heart failure) ([place])   . Depression    Chronic  . Diabetic peripheral neuropathy ([place])   . Dysrhythmia    tachycardia, normal ECHO 08-09-14  . ESRD (end stage renal disease) ([place])   . GERD (gastroesophageal reflux disease)   . Heart murmur     dx'd 02/05/2017  . Hepatitis C    "tx'd in 2016; I'm negative now" (02/05/2017)  . History of blood transfusion 2017   "w/knee replacement"  . Hyperlipidemia   . Hypertension   . Peripheral neuropathy   . Protein calorie malnutrition ([place])   . Septic arthritis ([place])   . Slow transit constipation   . Symptomatic anemia 01/27/2020  . Type II diabetes mellitus ([name])    IDDM  . Uncontrolled hypertension 02/18/2013    Patient Active Problem List   Diagnosis Date Noted  . Acute upper GI bleeding 09/08/2020  . Esophageal candidiasis ([place])  06/25/2020  . Intestinal angiodysplasia   . GIB (gastrointestinal bleeding) 06/21/2020  . Lab test positive for detection of COVID-19 virus 06/21/2020  . Rectal bleeding 06/20/2020  . Coronary artery disease of native artery of native heart with stable angina pectoris ([place]) 06/13/2020  . Nonrheumatic aortic valve insufficiency 06/13/2020  . Nonrheumatic tricuspid valve regurgitation 06/13/2020  . GAVE (gastric antral vascular ectasia)   . AVM (arteriovenous malformation) of small bowel, acquired with hemorrhage   . Hip swelling, right 05/27/2020  . Hypokalemia 05/27/2020  . Anemia of chronic renal failure 05/27/2020  . 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 Acute combined systolic and diastolic heart failure ([place])   . Unstable angina ([place])   . Symptomatic anemia 01/27/2020  . Hypercalcemia 10/06/2019  . Moderate protein-calorie malnutrition ([place]) 05/11/2018  . End stage renal disease ([place]) 04/19/2018  . Iron deficiency anemia, unspecified 04/19/2018  . Nephrotic syndrome with unspecified morphologic changes 04/19/2018  . Secondary hyperparathyroidism of renal origin ([place]) 04/19/2018  . Nicotine dependence, cigarettes, uncomplicated 37/62/8315  . Benign essential HTN   . Amputation above knee ([place])   . Anemia, blood loss   . DM type 2 causing ESRD ([place]) 03/21/2017  . Anemia in chronic kidney disease 02/05/2017  . Chronic diastolic  CHF (congestive heart failure) ([place]) 11/08/2015  . Diabetic peripheral neuropathy ([place])   . Major depression, recurrent, chronic ([place])   . Chronic kidney disease (CKD), stage IV (severe) ([place])   . Hepatitis C 08/16/2014  . Peripheral neuropathy 06/24/2013    Past Surgical History:  Procedure Laterality Date  . A/V FISTULAGRAM Right 07/07/2018   Procedure: A/V FISTULAGRAM;  Surgeon: Marty Heck, MD;  Location: [place] [facility];  Service: Cardiovascular;  Laterality: Right;  . AMPUTATION Left 03/20/2017    Procedure: LEFT ABOVE KNEE AMPUTATION;  Surgeon: Newt Minion, MD;  Location: [place];  Service: Orthopedics;  Laterality: Left;  . APPENDECTOMY    . AV FISTULA PLACEMENT Left 09/13/2014   Procedure: Brachial Artery to Brachial Vein Gortex Four - Seven Stretch GRAFT INSERTION Left Forearm;  Surgeon: Mal Misty, MD;  Location: [place];  Service: Vascular;  Laterality: Left;  . BASCILIC VEIN TRANSPOSITION Right 10/26/2017   Procedure: BASILIC VEIN TRANSPOSITION FIRST STAGE RIGHT ARM;  Surgeon: Conrad Hughestown, MD;  Location: [place];  Service: Vascular;  Laterality: Right;  . BASCILIC VEIN TRANSPOSITION Right 02/01/2018   Procedure: SECOND STAGE BASILIC VEIN TRANSPOSITION RIGHT UPPER EXTREMITY;  Surgeon: Marty Heck, MD;  Location: [place];  Service: Vascular;  Laterality: Right;  . CHOLECYSTECTOMY OPEN    . COLON SURGERY    . COLONOSCOPY    . ENTEROSCOPY N/A 05/28/2020   Procedure: ENTEROSCOPY;  Surgeon: Mauri Pole, MD;  Location: [facility] ENDOSCOPY;  Service: Endoscopy;  Laterality: N/A;  . ENTEROSCOPY N/A 06/25/2020   Procedure: ENTEROSCOPY;  Surgeon: Milus Banister, MD;  Location: [facility] ENDOSCOPY;  Service: Gastroenterology;  Laterality: N/A;  . ESOPHAGOGASTRODUODENOSCOPY (EGD) WITH PROPOFOL N/A 05/14/2020   Procedure: ESOPHAGOGASTRODUODENOSCOPY (EGD) WITH PROPOFOL;  Surgeon: Mauri Pole, MD;  Location: [facility];  Service: Endoscopy;  Laterality: N/A;  with APC  . EXCISIONAL TOTAL KNEE ARTHROPLASTY WITH ANTIBIOTIC SPACERS Left 02/06/2017   Procedure: Incisional total iknee with antibiotic spacer ;  Surgeon: Leandrew Koyanagi, MD;  Location: [place];  Service: Orthopedics;  Laterality: Left;  . HOT HEMOSTASIS N/A 05/14/2020   Procedure: HOT HEMOSTASIS (ARGON PLASMA COAGULATION/BICAP);  Surgeon: Mauri Pole, MD;  Location: [facility];  Service: Endoscopy;  Laterality: N/A;  . HOT HEMOSTASIS N/A 05/28/2020   Procedure: HOT HEMOSTASIS (ARGON PLASMA COAGULATION/BICAP);  Surgeon:  Mauri Pole, MD;  Location: [facility] ENDOSCOPY;  Service: Endoscopy;  Laterality: N/A;  . HOT HEMOSTASIS N/A 06/25/2020   Procedure: HOT HEMOSTASIS (ARGON PLASMA COAGULATION/BICAP);  Surgeon: Milus Banister, MD;  Location: [facility] ENDOSCOPY;  Service: Gastroenterology;  Laterality: N/A;  . INTRAVASCULAR PRESSURE WIRE/FFR STUDY N/A 04/13/2020   Procedure: INTRAVASCULAR PRESSURE WIRE/FFR STUDY;  Surgeon: Burnell Blanks, MD;  Location: [place] [facility];  Service: Cardiovascular;  Laterality: N/A;  . IR FLUORO GUIDE CV LINE RIGHT  02/10/2017  . IR REMOVAL TUN CV CATH W/O FL  04/01/2017  . IR US GUIDE VASC ACCESS RIGHT  02/10/2017  . IRRIGATION AND DEBRIDEMENT KNEE Left 03/12/2017   Procedure: IRRIGATION AND DEBRIDEMENT LEFT KNEE WITH WOUND VAC APPLICATION;  Surgeon: Leandrew Koyanagi, MD;  Location: [place];  Service: Orthopedics;  Laterality: Left;  . JOINT REPLACEMENT    . KNEE ARTHROSCOPY Right 08/10/2014   Procedure: ARTHROSCOPY I & D KNEE;  Surgeon: Marianna Payment, MD;  [facility];  Service: Orthopedics;  Laterality: Right;  . KNEE ARTHROSCOPY Left 08/11/2014   Procedure: ARTHROSCOPIC WASHOUT LEFT KNEE;  Surgeon: Naiping Michael Xu, MD;  [facility];  Service: Orthopedics;  Laterality: Left;  . KNEE ARTHROSCOPY Left 08/19/2014   Procedure: ARTHROSCOPIC WASHOUT LEFT KNEE;  Surgeon: Naiping M Xu, MD;  [facility];  Service: Orthopedics;  Laterality: Left;  . KNEE ARTHROSCOPY WITH LATERAL MENISECTOMY Left 04/04/2015   Procedure: AND PARTIAL LATERAL MENISECTOMY;  Surgeon: Naiping M Xu, MD;  [facility];  Service: Orthopedics;  Laterality: Left;  . KNEE ARTHROSCOPY WITH MEDIAL MENISECTOMY Left 04/04/2015   Procedure: LEFT KNEE ARTHROSCOPY WITH PARTIAL MEDIAL MENISCECTOMY  AND SYNOVECTOMY;  Surgeon: Naiping M Xu, MD;  Location: [facility];  Service: Orthopedics;  Laterality: Left;  . PERIPHERAL VASCULAR BALLOON ANGIOPLASTY  07/07/2018   Procedure:  PERIPHERAL VASCULAR BALLOON ANGIOPLASTY;  Surgeon: Clark, Christopher J, MD;  [facility];  Service: Cardiovascular;;  right AV fistula  . POLYPECTOMY    . RIGHT/LEFT HEART CATH AND CORONARY ANGIOGRAPHY N/A 04/13/2020   Procedure: RIGHT/LEFT HEART CATH AND CORONARY ANGIOGRAPHY;  Surgeon: McAlhany, Christopher D, MD;  [facility];  Service: Cardiovascular;  Laterality: N/A;  . SHOULDER ARTHROSCOPY Bilateral 08/10/2014   Procedure: I & D BILATERAL SHOULDERS ;  Surgeon: Naiping Michael Xu, MD;  [facility];  Service: Orthopedics;  Laterality: Bilateral;  . SMALL INTESTINE SURGERY     Due to Small Bowel Obstruction; "fixed it when they did my gallbladder OR"  . TEE WITHOUT CARDIOVERSION N/A 08/14/2014   Procedure: TRANSESOPHAGEAL ECHOCARDIOGRAM (TEE);  Surgeon: Philip J Nahser, MD;  [facility];  Service: Cardiovascular;  Laterality: N/A;  . TENOSYNOVECTOMY Right 08/11/2014   Procedure: RIGHT WRIST IRRIGATION AND DEBRIDEMENT, TENOSYNOVECTOMY;  Surgeon: Naiping Michael Xu, MD;  [facility];  Service: Orthopedics;  Laterality: Right;  . TOTAL KNEE ARTHROPLASTY Left 11/07/2015   Procedure: LEFT TOTAL KNEE ARTHROPLASTY WITH REVISION OF IMPLANTS;  Surgeon: Naiping M Xu, MD;  [facility];  Service: Orthopedics;  Laterality: Left;  . TUBAL LIGATION       OB History    Gravida  5   Para  0   Term  0   Preterm      AB  2   Living  3     SAB  1   IAB  1   Ectopic      Multiple      Live Births              Family History  Problem Relation Age of Onset  . Uterine cancer Mother   . Heart disease Father   . Hypertension Sister   . Uterine cancer Sister   . Cancer Brother        unsure type  . Diabetes Brother   . Hypertension Brother   . Colon cancer Neg Hx   . Colon polyps Neg Hx   . Esophageal cancer Neg Hx   . Rectal cancer Neg Hx   . Stomach cancer Neg Hx     Social History   Tobacco Use  . Smoking status:  Current Every Day Smoker    Packs/day: 0.50    Years: 45.00    Pack years: 22.50    Types: Cigarettes  . Smokeless tobacco: Never Used  Vaping Use  . Vaping Use: Never used  Substance Use Topics  . Alcohol use: Not Currently    Alcohol/week: 0.0 standard drinks    Comment: 03/10/2017 "I drink a wine cooler a few times/year"  .   Drug use: No    Home Medications Prior to Admission medications   Medication Sig Start Date End Date Taking? Authorizing Provider  Accu-Chek Softclix Lancets lancets Check sugars three times a day for E11.65 09/20/18   Andy, Camille L, MD  acetaminophen (TYLENOL) 500 MG tablet Take 500-1,000 mg by mouth every 6 (six) hours as needed (for pain.).    [provider]  ascorbic acid (VITAMIN C) 500 MG tablet Take 1 tablet (500 mg total) by mouth daily. 06/25/20   Goodrich, Daniel P, MD  atorvastatin (LIPITOR) 10 MG tablet Take 1 tablet (10 mg total) by mouth at bedtime. 06/13/20   Andy, Camille L, MD  blood glucose meter kit and supplies KIT Dispense based on patient and insurance preference. Use up to four times daily as directed 10/13/19   Andy, Camille L, MD  Blood Glucose Monitoring Suppl (ACCU-CHEK GUIDE) w/Device KIT 1 Device by Does not apply route daily. 10/31/19   Andy, Camille L, MD  calcium acetate (PHOSLO) 667 MG capsule Take 1,334-2,001 mg by mouth See admin instructions. Take 3 capsules (2001 mg) by mouth with breakfast, take 2 capsules (1334 mg) by mouth with lunch, take 3 capsules (2001 mg) by mouth with supper & take 2 capsules (1334 mg) by mouth with snacks. 01/13/19   [provider]  carvedilol (COREG) 3.125 MG tablet Take 1 tablet (3.125 mg total) by mouth 2 (two) times daily. 04/04/20 07/03/20  Schumann, Christopher L, MD  diltiazem 2 % GEL Apply 1 application topically 3 (three) times daily. Apply to affected area on anus 06/25/20   Goodrich, Daniel P, MD  diphenhydrAMINE (BENADRYL) 25 mg capsule Take 25 mg by mouth every 6 (six) hours as  needed for allergies.    [provider]  famotidine (PEPCID) 40 MG tablet Take 1 tablet (40 mg total) by mouth daily. 08/24/20   Kennedy-Smith, Colleen M, NP  gabapentin (NEURONTIN) 100 MG capsule Take 200 mg by mouth See admin instructions. Take 2 capsules (200 mg) by mouth 3 times on Sundays, Mondays, Wednesdays, & Fridays. 11/01/18   [provider]  gabapentin (NEURONTIN) 300 MG capsule Take 300 mg by mouth See admin instructions. Take 1 capsule (300 mg) by mouth twice daily on Tuesdays, Thursdays, & Saturdays.    [provider]  glucose blood (ACCU-CHEK GUIDE) test strip Check blood sugar up to 4 times a day 10/20/19   Andy, Camille L, MD  hydrALAZINE (APRESOLINE) 100 MG tablet Take 100 mg by mouth See admin instructions. TID on non-dialysis days.( m,w,f,sun)    [provider]  hydrocortisone (ANUSOL-HC) 25 MG suppository Place 1 suppository (25 mg total) rectally 2 (two) times daily. 06/25/20   Goodrich, Daniel P, MD  Iron, Ferrous Sulfate, 325 (65 Fe) MG TABS Take 325 mg by mouth daily. Note that this medication will turn stool black and may cause constipation. 06/25/20   Goodrich, Daniel P, MD  isosorbide mononitrate (IMDUR) 30 MG 24 hr tablet Take 1 tablet (30 mg total) by mouth daily. 07/16/20 10/14/20  Schumann, Christopher L, MD  Lancet Devices (ACCU-CHEK SOFTCLIX) lancets Check sugars TID for E11.65 01/29/15   Keck, Valerie A, NP  lidocaine-prilocaine (EMLA) cream Apply 1 application topically Every Tuesday,Thursday,and Saturday with dialysis. On days of dialysis 3x week 10/25/18   [provider]  methocarbamol (ROBAXIN) 500 MG tablet Take 500 mg by mouth every 8 (eight) hours as needed for muscle spasms.  06/08/19   [provider]  misoprostol (CYTOTEC) 100   [facility] tablet TAKE 1 TABLET BY MOUTH EVERY 6 HOURS 08/31/20   Simpson, Margaret E, MD  multivitamin (RENA-VIT) TABS tablet Take 1 tablet by mouth daily. 03/01/19   [provider]   nitroGLYCERIN (NITROSTAT) 0.4 MG SL tablet Place 1 tablet (0.4 mg total) under the tongue every 5 (five) minutes as needed. 04/25/20 07/24/20  Schumann, Christopher L, MD  pantoprazole (PROTONIX) 20 MG tablet Take 20 mg by mouth 2 (two) times daily. 07/14/20   [provider]  pantoprazole (PROTONIX) 40 MG tablet Take 1 tablet (40 mg total) by mouth daily. 40 mg BID for 2 months & then once daily 08/03/20 10/02/20  Andy, Camille L, MD  polyethylene glycol (MIRALAX / GLYCOLAX) 17 g packet Take 17 g by mouth daily as needed for moderate constipation. 12/10/18   Andy, Camille L, MD  predniSONE (DELTASONE) 20 MG tablet Take by mouth. 08/06/20   [provider]  sertraline (ZOLOFT) 100 MG tablet Take 2 tablets (200 mg total) by mouth daily. 06/13/20   Andy, Camille L, MD  traZODone (DESYREL) 50 MG tablet Take 50 mg by mouth at bedtime. 10/27/18   [provider]  UNABLE TO FIND Prosthetic socket, from [facility] 08/03/20   Andy, Camille L, MD    Allergies    Compazine [prochlorperazine], Shellfish-derived products, Iodinated diagnostic agents, Omnipaque [iohexol], Tape, and Sulfa antibiotics  Review of Systems   Review of Systems  Constitutional: Negative for chills and fever.  HENT: Negative for rhinorrhea and sore throat.   Eyes: Negative for visual disturbance.  Respiratory: Negative for cough and shortness of breath.   Cardiovascular: Positive for chest pain. Negative for leg swelling.  Gastrointestinal: Positive for blood in stool. Negative for abdominal pain, diarrhea, nausea and vomiting.  Genitourinary: Negative for dysuria.  Musculoskeletal: Negative for back pain and neck pain.  Skin: Negative for rash.  Neurological: Negative for dizziness, light-headedness and headaches.  Hematological: Does not bruise/bleed easily.  Psychiatric/Behavioral: Negative for confusion.    Physical Exam Updated Vital Signs BP 125/61 (BP Location: Left Arm)   Pulse 93   Temp  97.6 F (36.4 C) (Oral)   Resp 14   Ht 1.702 m (5' 7")   Wt 58.1 kg   LMP 03/16/2002   SpO2 98%   BMI 20.05 kg/m   Physical Exam Vitals and nursing note reviewed.  Constitutional:      General: She is not in acute distress.    Appearance: She is well-developed.  HENT:     Head: Normocephalic and atraumatic.  Eyes:     Extraocular Movements: Extraocular movements intact.     Conjunctiva/sclera: Conjunctivae normal.     Pupils: Pupils are equal, round, and reactive to light.  Cardiovascular:     Rate and Rhythm: Normal rate and regular rhythm.     Heart sounds: No murmur heard.   Pulmonary:     Effort: Pulmonary effort is normal. No respiratory distress.     Breath sounds: Normal breath sounds.  Abdominal:     Palpations: Abdomen is soft.     Tenderness: There is no abdominal tenderness.  Genitourinary:    Rectum: Guaiac result positive.     Comments: Rectal exam with maroon stool heme positive Musculoskeletal:        General: No swelling.     Cervical back: Neck supple.     Comments: Left above-the-knee amputation.  AV fistula upper extremity.  Good thrill  Skin:    General: Skin is warm and   dry.  Neurological:     General: No focal deficit present.     Mental Status: She is alert and oriented to person, place, and time.     Cranial Nerves: No cranial nerve deficit.     Sensory: No sensory deficit.     Coordination: Coordination abnormal.     ED Results / Procedures / Treatments   Labs (all labs ordered are listed, but only abnormal results are displayed) Labs Reviewed  BASIC METABOLIC PANEL - Abnormal; Notable for the following components:      Result Value   Chloride 96 (*)    Glucose, Bld 176 (*)    BUN 37 (*)    Creatinine, Ser 4.71 (*)    Calcium 8.6 (*)    GFR, Estimated 10 (*)    All other components within normal limits  CBC WITH DIFFERENTIAL/PLATELET - Abnormal; Notable for the following components:   RBC 1.65 (*)    Hemoglobin 4.7 (*)    HCT  15.4 (*)    RDW 18.4 (*)    All other components within normal limits  BASIC METABOLIC PANEL - Abnormal; Notable for the following components:   Chloride 95 (*)    Glucose, Bld 127 (*)    BUN 38 (*)    Creatinine, Ser 4.77 (*)    Calcium 8.6 (*)    GFR, Estimated 10 (*)    All other components within normal limits  TROPONIN I (HIGH SENSITIVITY) - Abnormal; Notable for the following components:   Troponin I (High Sensitivity) 339 (*)    All other components within normal limits  TROPONIN I (HIGH SENSITIVITY) - Abnormal; Notable for the following components:   Troponin I (High Sensitivity) 296 (*)    All other components within normal limits  RESP PANEL BY RT-PCR (FLU A&B, COVID) ARPGX2  CBC WITH DIFFERENTIAL/PLATELET  POC OCCULT BLOOD, ED  TYPE AND SCREEN  PREPARE RBC (CROSSMATCH)    EKG EKG Interpretation  Date/Time:  Saturday September 08 2020 10:18:42 EDT Ventricular Rate:  93 PR Interval:    QRS Duration: 92 QT Interval:  269 QTC Calculation: 335 R Axis:   69 Text Interpretation: Sinus or ectopic atrial rhythm Low voltage, precordial leads Anteroseptal infarct, old Repol abnrm suggests ischemia, diffuse leads Confirmed by Ashey Tramontana ([id number]) on 09/08/2020 10:31:11 AM   Radiology DG Chest Port 1 View  Result Date: 09/08/2020 CLINICAL DATA:  Chest pain. EXAM: PORTABLE CHEST 1 VIEW COMPARISON:  10/22/2019 FINDINGS: Marked cardiac enlargement. Aortic atherosclerosis. Small left pleural effusion is new from previous exam. Mild diffuse pulmonary vascular congestion. IMPRESSION: 1. New small left pleural effusion. 2. Cardiac enlargement and pulmonary vascular congestion. 3.  Aortic Atherosclerosis (ICD10-I70.0). Electronically Signed   By: Taylor  Stroud M.D.   On: 09/08/2020 13:22    Procedures Procedures   CRITICAL CARE Performed by: Terion Hedman Total critical care time: 60 minutes Critical care time was exclusive of separately billable procedures and treating other  patients. Critical care was necessary to treat or prevent imminent or life-threatening deterioration. Critical care was time spent personally by me on the following activities: development of treatment plan with patient and/or surrogate as well as nursing, discussions with consultants, evaluation of patient's response to treatment, examination of patient, obtaining history from patient or surrogate, ordering and performing treatments and interventions, ordering and review of laboratory studies, ordering and review of radiographic studies, pulse oximetry and re-evaluation of patient's condition.   Medications Ordered in ED Medications  0.9 %  sodium   chloride infusion (has no administration in time range)  Chlorhexidine Gluconate Cloth 2 % PADS 6 each (has no administration in time range)    ED Course  I have reviewed the triage vital signs and the nursing notes.  Pertinent labs & imaging results that were available during my care of the patient were reviewed by me and considered in my medical decision making (see chart for details).    MDM Rules/Calculators/A&P                          Delays in getting patient's CBC results.  But hemoglobin eventually came back 4.7.  Patient remained hemodynamically stable here blood pressures been good not tachycardic mentating well.  Rectal exam shows maroon stool.  This should be another GI bleed.  Covid testing has been sent.  Transfusion for 2 units ordered.  Patient's work-up here today did not show an acute reason for dialysis.  But is spoken with nephrology Dr. Schuetz probably planning to transfuse blood while dialyzing.  So that they can handle the fluid better.  Also spoke with [facility] who will see patient in consultation.  Work-up for the chest pain patient's initial troponin was in the 300 range.  But repeat was 289 so it was downgoing.  Patient is now chest pain-free.  Discussed with hospitalist Dr. Yates who will admit. Final Clinical  Impression(s) / ED Diagnoses Final diagnoses:  Anemia due to chronic kidney disease, on chronic dialysis ([name])  Gastrointestinal hemorrhage, unspecified gastrointestinal hemorrhage type    Rx / DC Orders ED Discharge Orders    None       , , MD 09/08/20 1646  

## 2020-09-08 NOTE — ED Notes (Signed)
Blood consent signed in the room.

## 2020-09-08 NOTE — ED Triage Notes (Signed)
Patient presents to the ED after being called from dialysis and told that her hemaglobin was 5 and told to come to the ED.  Patient also reports chest pain that started yesterday.[name]

## 2020-09-08 NOTE — Consult Note (Signed)
Renal Service Consult Note [facility]  Brandy Houston 09/08/2020 Sol Blazing, MD Requesting Physician: Dr. Rogene Houston  Reason for Consult: ESRD pt w/ anemia, r/o GIB HPI: The patient is a 65 y.o. year-old w/ hx of DM2, HTN, anemia, ESRD on HD TTS, hx hep C (rx'd in 2016), depression presented to ED sent from dialysis for low Hb 5 from lab earlier this week.  Did not get HD today.  Pt d/o severe gen weakness and fatigue. Hx of AVM's in Jan 2022 here. +bloody BM's for several days per patient. Hb here is 4.7. Asked to see for dialysis.   Pt seen in room. No c/o other than gen weakness and bloody stools.  No abd pain. No fevers or chills.  Admit Jan 2022 for GI bleed / BRBPR w/ clots, hx of GIB d/t AVM's.  Pt had RBC nuclear study unrevealing. Had SB enteroscopy showing 2 nonbleeding AVM's. Bleeding stopped, Hb stable after transfusion. DC'd on PPI, cytotec, indefinite Fe supp. Diflucan for 10 days. Pt was COVID + w/o resp issues. ESRD got HD tiw.   Admit Dec 2021 for abd pain , N/V was diagnosed w/ upper GIB. Hb 6.1 on arrival. EGD showed grade C reflux esopahgitis, candida esophagitis and GAVE w/ bleeding tre3ated w/ APC. Stomach and duodenum lesions also Rx'd w/ APC. Rx diflucan 10 qd x 7d and PPI bid for 2 mo then once daily.   Admit Nov 2021 for chest pain / unstable angina. Had [facility] which showed severe prox LAD disease 70%, severe ostial ramus intermediate stenosis, severe prox Circ stenosis and dominant RCA w/ moderate 50% prox stenosis. High R/L heart pressurs. Cards saw pt and felt to be very high risk for atherectomy and PCI. +comorbid conditions including severe TR, LV failrue and GAVE w/ chronic anemia. Cardiac surgery saw her for severe valve disease , she was turned down for surgery. Medical Rx was chosen overall.   Admit aug 2021 for worsening anemia. Pt was given PRBC x 1 and Hb was up to 8.3. GI consulted and planned for EGD/ colon as outpatient. Pt dc'd home.   Admit may 2021 for ABL anemia, CP w/ ^troponin felt to be demand ischemia. Stress test w/ essentially normal, small fixed defect at the apex but normal wall motion on echo. HTN. ESRD on HD.     ROS  denies CP  no joint pain   no HA  no blurry vision  no rash  no diarrhea  no nausea/ vomiting  no dysuria  no difficulty voiding  no change in urine color    Past Medical History  Past Medical History:  Diagnosis Date  . Allergy   . Anemia   . Anxiety   . Arthritis    "in my joints" (03/10/2017)  . Chronic diastolic CHF (congestive heart failure) ([place])   . CKD (chronic kidney disease), stage IV ([place])    stage IV. previous HD, none currently 10/30/15 (confirmed 02/05/2017 & 03/10/2017)  . Depression    Chronic  . Diabetic peripheral neuropathy ([place])   . Dysrhythmia    tachycardia, normal ECHO 08-09-14  . GERD (gastroesophageal reflux disease)   . Heart murmur     dx'd 02/05/2017  . Hepatitis C    "tx'd in 2016; I'm negative now" (02/05/2017)  . History of blood transfusion 2017   "w/knee replacement"  . Hyperlipidemia   . Hypertension   . Osteoarthritis of left knee   . Peripheral neuropathy   .  Protein calorie malnutrition ([place])   . Septic arthritis ([place])   . Slow transit constipation   . Symptomatic anemia 01/27/2020  . Type II diabetes mellitus ([name])    IDDM  . Uncontrolled hypertension 02/18/2013  . Unsteady gait    Past Surgical History  Past Surgical History:  Procedure Laterality Date  . A/V FISTULAGRAM Right 07/07/2018   Procedure: A/V FISTULAGRAM;  Surgeon: Marty Heck, MD;  Location: [place] [facility];  Service: Cardiovascular;  Laterality: Right;  . AMPUTATION Left 03/20/2017   Procedure: LEFT ABOVE KNEE AMPUTATION;  Surgeon: Newt Minion, MD;  Location: [place];  Service: Orthopedics;  Laterality: Left;  . APPENDECTOMY    . AV FISTULA PLACEMENT Left 09/13/2014   Procedure: Brachial Artery to Brachial Vein Gortex Four - Seven Stretch GRAFT INSERTION  Left Forearm;  Surgeon: Mal Misty, MD;  Location: [place];  Service: Vascular;  Laterality: Left;  . BASCILIC VEIN TRANSPOSITION Right 10/26/2017   Procedure: BASILIC VEIN TRANSPOSITION FIRST STAGE RIGHT ARM;  Surgeon: Conrad F[place] Run, MD;  Location: [place];  Service: Vascular;  Laterality: Right;  . BASCILIC VEIN TRANSPOSITION Right 02/01/2018   Procedure: SECOND STAGE BASILIC VEIN TRANSPOSITION RIGHT UPPER EXTREMITY;  Surgeon: Marty Heck, MD;  Location: [place];  Service: Vascular;  Laterality: Right;  . CHOLECYSTECTOMY OPEN    . COLON SURGERY    . COLONOSCOPY    . ENTEROSCOPY N/A 05/28/2020   Procedure: ENTEROSCOPY;  Surgeon: Mauri Pole, MD;  Location: [facility] ENDOSCOPY;  Service: Endoscopy;  Laterality: N/A;  . ENTEROSCOPY N/A 06/25/2020   Procedure: ENTEROSCOPY;  Surgeon: Milus Banister, MD;  Location: [facility] ENDOSCOPY;  Service: Gastroenterology;  Laterality: N/A;  . ESOPHAGOGASTRODUODENOSCOPY (EGD) WITH PROPOFOL N/A 05/14/2020   Procedure: ESOPHAGOGASTRODUODENOSCOPY (EGD) WITH PROPOFOL;  Surgeon: Mauri Pole, MD;  Location: [facility] ENDOSCOPY;  Service: Endoscopy;  Laterality: N/A;  with APC  . EXCISIONAL TOTAL KNEE ARTHROPLASTY WITH ANTIBIOTIC SPACERS Left 02/06/2017   Procedure: Incisional total iknee with antibiotic spacer ;  Surgeon: Leandrew Koyanagi, MD;  Location: [place];  Service: Orthopedics;  Laterality: Left;  . HOT HEMOSTASIS N/A 05/14/2020   Procedure: HOT HEMOSTASIS (ARGON PLASMA COAGULATION/BICAP);  Surgeon: Mauri Pole, MD;  Location: [facility];  Service: Endoscopy;  Laterality: N/A;  . HOT HEMOSTASIS N/A 05/28/2020   Procedure: HOT HEMOSTASIS (ARGON PLASMA COAGULATION/BICAP);  Surgeon: Mauri Pole, MD;  Location: [facility] ENDOSCOPY;  Service: Endoscopy;  Laterality: N/A;  . HOT HEMOSTASIS N/A 06/25/2020   Procedure: HOT HEMOSTASIS (ARGON PLASMA COAGULATION/BICAP);  Surgeon: Milus Banister, MD;  Location: [facility] ENDOSCOPY;  Service: Gastroenterology;   Laterality: N/A;  . INTRAVASCULAR PRESSURE WIRE/FFR STUDY N/A 04/13/2020   Procedure: INTRAVASCULAR PRESSURE WIRE/FFR STUDY;  Surgeon: Burnell Blanks, MD;  Location: [place] [facility];  Service: Cardiovascular;  Laterality: N/A;  . IR FLUORO GUIDE CV LINE RIGHT  02/10/2017  . IR REMOVAL TUN CV CATH W/O FL  04/01/2017  . IR US GUIDE VASC ACCESS RIGHT  02/10/2017  . IRRIGATION AND DEBRIDEMENT KNEE Left 03/12/2017   Procedure: IRRIGATION AND DEBRIDEMENT LEFT KNEE WITH WOUND VAC APPLICATION;  Surgeon: Leandrew Koyanagi, MD;  Location: [place];  Service: Orthopedics;  Laterality: Left;  . JOINT REPLACEMENT    . KNEE ARTHROSCOPY Right 08/10/2014   Procedure: ARTHROSCOPY I & D KNEE;  Surgeon: Marianna Payment, MD;  Location: [facility];  Service: Orthopedics;  Laterality: Right;  . KNEE ARTHROSCOPY Left 08/11/2014   Procedure:  ARTHROSCOPIC WASHOUT LEFT KNEE;  Surgeon: Marianna Payment, MD;  Location: [place];  Service: Orthopedics;  Laterality: Left;  . KNEE ARTHROSCOPY Left 08/19/2014   Procedure: ARTHROSCOPIC WASHOUT LEFT KNEE;  Surgeon: Leandrew Koyanagi, MD;  Location: [place];  Service: Orthopedics;  Laterality: Left;  . KNEE ARTHROSCOPY WITH LATERAL MENISECTOMY Left 04/04/2015   Procedure: AND PARTIAL LATERAL MENISECTOMY;  Surgeon: Leandrew Koyanagi, MD;  Location: [place];  Service: Orthopedics;  Laterality: Left;  . KNEE ARTHROSCOPY WITH MEDIAL MENISECTOMY Left 04/04/2015   Procedure: LEFT KNEE ARTHROSCOPY WITH PARTIAL MEDIAL MENISCECTOMY  AND SYNOVECTOMY;  Surgeon: Leandrew Koyanagi, MD;  Location: [place];  Service: Orthopedics;  Laterality: Left;  . PERIPHERAL VASCULAR BALLOON ANGIOPLASTY  07/07/2018   Procedure: PERIPHERAL VASCULAR BALLOON ANGIOPLASTY;  Surgeon: Marty Heck, MD;  Location: [place] [facility];  Service: Cardiovascular;;  right AV fistula  . POLYPECTOMY    . RIGHT/LEFT HEART CATH AND CORONARY ANGIOGRAPHY N/A 04/13/2020   Procedure: RIGHT/LEFT HEART  CATH AND CORONARY ANGIOGRAPHY;  Surgeon: Burnell Blanks, MD;  Location: [place] [facility];  Service: Cardiovascular;  Laterality: N/A;  . SHOULDER ARTHROSCOPY Bilateral 08/10/2014   Procedure: I & D BILATERAL SHOULDERS ;  Surgeon: Marianna Payment, MD;  Location: [facility];  Service: Orthopedics;  Laterality: Bilateral;  . SMALL INTESTINE SURGERY     Due to Small Bowel Obstruction; "fixed it when they did my gallbladder OR"  . TEE WITHOUT CARDIOVERSION N/A 08/14/2014   Procedure: TRANSESOPHAGEAL ECHOCARDIOGRAM (TEE);  Surgeon: Thayer Headings, MD;  Location: [place];  Service: Cardiovascular;  Laterality: N/A;  . TENOSYNOVECTOMY Right 08/11/2014   Procedure: RIGHT WRIST IRRIGATION AND DEBRIDEMENT, TENOSYNOVECTOMY;  Surgeon: Marianna Payment, MD;  Location: [place];  Service: Orthopedics;  Laterality: Right;  . TOTAL KNEE ARTHROPLASTY Left 11/07/2015   Procedure: LEFT TOTAL KNEE ARTHROPLASTY WITH REVISION OF IMPLANTS;  Surgeon: Leandrew Koyanagi, MD;  Location: [place];  Service: Orthopedics;  Laterality: Left;  . TUBAL LIGATION     Family History  Family History  Problem Relation Age of Onset  . Uterine cancer Mother   . Heart disease Father   . Hypertension Sister   . Uterine cancer Sister   . Cancer Brother        unsure type  . Diabetes Brother   . Hypertension Brother   . Colon cancer Neg Hx   . Colon polyps Neg Hx   . Esophageal cancer Neg Hx   . Rectal cancer Neg Hx   . Stomach cancer Neg Hx    Social History  reports that she has been smoking cigarettes. She has a 17.50 pack-year smoking history. She has never used smokeless tobacco. She reports previous alcohol use. She reports that she does not use drugs. Allergies  Allergies  Allergen Reactions  . Compazine [Prochlorperazine] Shortness Of Breath, Swelling and Other (See Comments)    TONGUE SWELLS  . Shellfish-Derived Products Anaphylaxis  . Iodinated Diagnostic Agents Hives and Rash  . Omnipaque [Iohexol] Hives  .  Tape     Brown paper tape caused skin irritation  . Sulfa Antibiotics Rash   Home medications Prior to Admission medications   Medication Sig Start Date End Date Taking? Authorizing Provider  Accu-Chek Softclix Lancets lancets Check sugars three times a day for E11.65 09/20/18   [name], MD  acetaminophen (TYLENOL) 500 MG tablet Take 500-1,000 mg by mouth every 6 (six) hours as needed (  for pain.).    [provider]  ascorbic acid (VITAMIN C) 500 MG tablet Take 1 tablet (500 mg total) by mouth daily. 06/25/20   Samuella Cota, MD  atorvastatin (LIPITOR) 10 MG tablet Take 1 tablet (10 mg total) by mouth at bedtime. 06/13/20   [name], MD  blood glucose meter kit and supplies KIT Dispense based on patient and insurance preference. Use up to four times daily as directed 10/13/19   [name], MD  Blood Glucose Monitoring Suppl (ACCU-CHEK GUIDE) w/Device KIT 1 Device by Does not apply route daily. 10/31/19   [name], MD  calcium acetate (PHOSLO) 667 MG capsule Take 1,334-2,001 mg by mouth See admin instructions. Take 3 capsules (2001 mg) by mouth with breakfast, take 2 capsules (1334 mg) by mouth with lunch, take 3 capsules (2001 mg) by mouth with supper & take 2 capsules (1334 mg) by mouth with snacks. 01/13/19   [provider]  carvedilol (COREG) 3.125 MG tablet Take 1 tablet (3.125 mg total) by mouth 2 (two) times daily. 04/04/20 07/03/20  Donato Heinz, MD  diltiazem 2 % GEL Apply 1 application topically 3 (three) times daily. Apply to affected area on anus 06/25/20   Samuella Cota, MD  diphenhydrAMINE (BENADRYL) 25 mg capsule Take 25 mg by mouth every 6 (six) hours as needed for allergies.    [provider]  famotidine (PEPCID) 40 MG tablet Take 1 tablet (40 mg total) by mouth daily. 08/24/20   Noralyn Pick, NP  gabapentin (NEURONTIN) 100 MG capsule Take 200 mg by mouth See admin instructions. Take 2 capsules (200 mg)  by mouth 3 times on Sundays, Mondays, Wednesdays, & Fridays. 11/01/18   [provider]  gabapentin (NEURONTIN) 300 MG capsule Take 300 mg by mouth See admin instructions. Take 1 capsule (300 mg) by mouth twice daily on Tuesdays, Thursdays, & Saturdays.    [provider]  glucose blood (ACCU-CHEK GUIDE) test strip Check blood sugar up to 4 times a day 10/20/19   [name], MD  hydrALAZINE (APRESOLINE) 100 MG tablet Take 100 mg by mouth See admin instructions. TID on non-dialysis days.( m,w,f,sun)    [provider]  hydrocortisone (ANUSOL-HC) 25 MG suppository Place 1 suppository (25 mg total) rectally 2 (two) times daily. 06/25/20   Samuella Cota, MD  Iron, Ferrous Sulfate, 325 (65 Fe) MG TABS Take 325 mg by mouth daily. Note that this medication will turn stool black and may cause constipation. 06/25/20   Samuella Cota, MD  isosorbide mononitrate (IMDUR) 30 MG 24 hr tablet Take 1 tablet (30 mg total) by mouth daily. 07/16/20 10/14/20  Donato Heinz, MD  Lancet Devices [facility]) lancets Check sugars TID for E11.65 01/29/15   Lance Bosch, NP  lidocaine-prilocaine (EMLA) cream Apply 1 application topically Every Tuesday,Thursday,and Saturday with dialysis. On days of dialysis 3x week 10/25/18   [provider]  methocarbamol (ROBAXIN) 500 MG tablet Take 500 mg by mouth every 8 (eight) hours as needed for muscle spasms.  06/08/19   [provider]  misoprostol (CYTOTEC) 100 MCG tablet TAKE 1 TABLET BY MOUTH EVERY 6 HOURS 08/31/20   Fayrene Helper, MD  multivitamin (RENA-VIT) TABS tablet Take 1 tablet by mouth daily. 03/01/19   [provider]  nitroGLYCERIN (NITROSTAT) 0.4 MG SL tablet Place 1 tablet (0.4 mg total) under the tongue every 5 (five) minutes as needed. 04/25/20 07/24/20  Gardiner Rhyme,  Doreatha Martin, MD  pantoprazole (PROTONIX) 20 MG tablet Take 20 mg by mouth 2 (two) times daily. 07/14/20   [provider]  pantoprazole (PROTONIX) 40 MG tablet Take 1 tablet (40 mg total) by mouth daily. 40 mg BID for 2 months & then once daily 08/03/20 10/02/20  [name], MD  polyethylene glycol (MIRALAX / GLYCOLAX) 17 g packet Take 17 g by mouth daily as needed for moderate constipation. 12/10/18   [name], MD  predniSONE (DELTASONE) 20 MG tablet Take by mouth. 08/06/20   [provider]  sertraline (ZOLOFT) 100 MG tablet Take 2 tablets (200 mg total) by mouth daily. 06/13/20   [name], MD  traZODone (DESYREL) 50 MG tablet Take 50 mg by mouth at bedtime. 10/27/18   [provider]  UNABLE TO FIND Prosthetic socket, from [facility] 08/03/20   [name], MD     Vitals:   09/08/20 1230 09/08/20 1302 09/08/20 1438 09/08/20 1500  BP: 128/88 124/63 (!) 118/53 128/62  Pulse: 90 91 92 93  Resp: '14 20 18 11  ' Temp:      TempSrc:      SpO2: 98% 100% 100% 94%  Weight:      Height:       Exam Gen alert, no distress No rash, cyanosis or gangrene Sclera anicteric, throat clear  No jvd or bruits Chest clear bilat to bases, no rales/ wheezing RRR no MRG Abd soft ntnd no mass or ascites +bs GU normal MS no joint effusions or deformity Ext no LE or UE edema, no wounds or ulcers Neuro is alert, Ox 3 , nf  RUE AVF+bruit     Home meds:  - lipitor 10/ coreg 3.125 bid/ hydralazine 100 tid nonhd days/ imdur 30 qd/ sl ntg prn  - trazodone 50 hs/ zoloft 2109m qd/ neurontin 3086mbid on TTS and 200 tid non hd days  - pepcid 40 qd  - prn's/ vitamins/ supplements     Na 135  K 4.1  CO2 31  BUN 38  Cr 4.77  Ca 8.6     WBC 6K Hb 4.7, Hct 15%  mcv 93      BP 128/88  HR 91  RR 16  Temp 98.4    CXR 3/26 - IMPRESSION: 1. New small left pleural effusion. 2. Cardiac enlargement and pulmonary vascular congestion. 3.  Aortic Atherosclerosis (ICD10-I70.0).      OP HD: [facility] TTS   4h  450/800 Hep none - from admit in Jan 2022   - get records in am   Assessment/  Plan: 1. Severe anemia - suspected recurrent GIB. Here in Jan for GIB w/ gastric/ duodenal AVM's, also candida and reflux esophagitis. Being admitted for transfusion and w/u. 2u prbc's ordered, will give in HD what's not been given already in ED.  2. ESRD - on HD TTS.  Did not get HD this am due to severe anemia pt sent to ED.  Plan HD this evening.  3. HTN - bp's well controlled.  4. Volume - no vol excess on exam.  5. Depression 6. Anemia ckd - get esa records in am 7. MBD ckd - get records in am      RoKelly SplinterMD 09/08/2020, 3:53 PM  Recent Labs  Lab 09/08/20 1221  WBC 6.5  HGB 4.7*   Recent Labs  Lab 09/08/20 1050 09/08/20 1221  K 4.0 4.1  BUN 37* 38*  CREATININE 4.71*  4.77*  CALCIUM 8.6* 8.6*

## 2020-09-09 ENCOUNTER — Encounter (HOSPITAL_COMMUNITY)
Admission: EM | Disposition: A | Discharge status: Home / Self Care | Payer: Self-pay | Source: Home / Self Care | Attending: Internal Medicine

## 2020-09-09 HISTORY — PX: GIVENS CAPSULE STUDY: SHX5432

## 2020-09-09 LAB — BASIC METABOLIC PANEL
Anion gap: 7 (ref 5–15)
BUN: 15 mg/dL (ref 8–23)
CO2: 29 mmol/L (ref 22–32)
Calcium: 8.2 mg/dL — ABNORMAL LOW (ref 8.9–10.3)
Chloride: 101 mmol/L (ref 98–111)
Creatinine, Ser: 2.58 mg/dL — ABNORMAL HIGH (ref 0.44–1.00)
GFR, Estimated: 20 mL/min — ABNORMAL LOW (ref >60)
Glucose, Bld: 123 mg/dL — ABNORMAL HIGH (ref 70–99)
Potassium: 3.2 mmol/L — ABNORMAL LOW (ref 3.5–5.1)
Sodium: 137 mmol/L (ref 135–145)

## 2020-09-09 LAB — BPAM RBC
Blood Product Expiration Date: 202204212359
Blood Product Expiration Date: 202204212359
ISSUE DATE / TIME: 202203261705
ISSUE DATE / TIME: 202203262049
Unit Type and Rh: 6200
Unit Type and Rh: 6200

## 2020-09-09 LAB — TYPE AND SCREEN
ABO/RH(D): A POS
Antibody Screen: NEGATIVE
Unit division: 0
Unit division: 0

## 2020-09-09 LAB — CBC
HCT: 23 % — ABNORMAL LOW (ref 36.0–46.0)
Hemoglobin: 7.4 g/dL — ABNORMAL LOW (ref 12.0–15.0)
MCH: 28.8 pg (ref 26.0–34.0)
MCHC: 32.2 g/dL (ref 30.0–36.0)
MCV: 89.5 fL (ref 80.0–100.0)
Platelets: 167 10*3/uL (ref 150–400)
RBC: 2.57 MIL/uL — ABNORMAL LOW (ref 3.87–5.11)
RDW: 16.7 % — ABNORMAL HIGH (ref 11.5–15.5)
WBC: 5.2 10*3/uL (ref 4.0–10.5)
nRBC: 0 % (ref 0.0–0.2)

## 2020-09-09 LAB — GLUCOSE, CAPILLARY
Glucose-Capillary: 128 mg/dL — ABNORMAL HIGH (ref 70–99)
Glucose-Capillary: 110 mg/dL — ABNORMAL HIGH (ref 70–99)
Glucose-Capillary: 158 mg/dL — ABNORMAL HIGH (ref 70–99)
Glucose-Capillary: 123 mg/dL — ABNORMAL HIGH (ref 70–99)

## 2020-09-09 LAB — HIV ANTIBODY (ROUTINE TESTING W REFLEX): HIV Screen 4th Generation wRfx: NONREACTIVE

## 2020-09-09 SURGERY — IMAGING PROCEDURE, GI TRACT, INTRALUMINAL, VIA CAPSULE

## 2020-09-09 MED ORDER — SODIUM CHLORIDE 0.9 % IV SOLN
8.0000 mg/h | INTRAVENOUS | Status: DC
  Administered 2020-09-09 – 2020-09-10 (×3): 8 mg/h via INTRAVENOUS
  Filled 2020-09-09 (×2): qty 80

## 2020-09-09 MED ORDER — SODIUM CHLORIDE 0.9 % IV SOLN
80.0000 mg | Freq: Once | INTRAVENOUS | Status: AC
  Administered 2020-09-09: 80 mg via INTRAVENOUS

## 2020-09-09 MED ORDER — DARBEPOETIN ALFA 200 MCG/0.4ML IJ SOSY
200.0000 ug | PREFILLED_SYRINGE | Freq: Once | INTRAMUSCULAR | Status: AC
  Administered 2020-09-09: 200 ug via SUBCUTANEOUS
  Filled 2020-09-09: qty 0.4

## 2020-09-09 NOTE — Progress Notes (Signed)
Patient swallowed [facility] pill cam with no difficulties. Instructions provided.

## 2020-09-09 NOTE — Progress Notes (Signed)
Subjective: The patient does not report any further bleeding overnight.  Objective: Vital signs in last 24 hours: Temp:  [97.6 F (36.4 C)-98.7 F (37.1 C)] 98.7 F (37.1 C) (03/27 0442) Pulse Rate:  [79-96] 88 (03/27 0442) Resp:  [10-20] 18 (03/27 0442) BP: (118-137)/(37-116) 128/62 (03/27 0442) SpO2:  [94 %-100 %] 100 % (03/27 0442) Weight:  [58.1 kg] 58.1 kg (03/26 1018) Last BM Date: 09/08/20  Intake/Output from previous day: 03/26 0701 - 03/27 0700 In: 315 [Blood:315] Out: 2000  Intake/Output this shift: No intake/output data recorded.  General appearance: alert and no distress GI: soft, non-tender; bowel sounds normal; no masses,  no organomegaly  Lab Results: Recent Labs    09/08/20 1221 09/09/20 0049  WBC 6.5 5.2  HGB 4.7* 7.4*  HCT 15.4* 23.0*  PLT 234 167   BMET Recent Labs    09/08/20 1050 09/08/20 1221 09/09/20 0049  NA 135 135 137  K 4.0 4.1 3.2*  CL 96* 95* 101  CO2 27 31 29   GLUCOSE 176* 127* 123*  BUN 37* 38* 15  CREATININE 4.71* 4.77* 2.58*  CALCIUM 8.6* 8.6* 8.2*   LFT No results for input(s): PROT, ALBUMIN, AST, ALT, ALKPHOS, BILITOT, BILIDIR, IBILI in the last 72 hours. PT/INR No results for input(s): LABPROT, INR in the last 72 hours. Hepatitis Panel Recent Labs    09/08/20 2253  HEPBSAG NON REACTIVE   C-Diff No results for input(s): CDIFFTOX in the last 72 hours. Fecal Lactopherrin No results for input(s): FECLLACTOFRN in the last 72 hours.  Studies/Results: DG Chest Port 1 View  Result Date: 09/08/2020 CLINICAL DATA:  Chest pain. EXAM: PORTABLE CHEST 1 VIEW COMPARISON:  10/22/2019 FINDINGS: Marked cardiac enlargement. Aortic atherosclerosis. Small left pleural effusion is new from previous exam. Mild diffuse pulmonary vascular congestion. IMPRESSION: 1. New small left pleural effusion. 2. Cardiac enlargement and pulmonary vascular congestion. 3.  Aortic Atherosclerosis (ICD10-I70.0). Electronically Signed   By: Kerby Moors  M.D.   On: 09/08/2020 13:22    Medications:  Scheduled: . atorvastatin  10 mg Oral QHS  . calcium acetate  1,334 mg Oral Q lunch  . calcium acetate  2,001 mg Oral BID WC  . Chlorhexidine Gluconate Cloth  6 each Topical Q0600  . gabapentin  200 mg Oral 3 times per day on Sun Mon Wed Fri  . gabapentin  300 mg Oral 2 times per day on Tue Thu Sat  . hydrALAZINE  100 mg Oral 3 times per day on Sun Mon Wed Fri  . insulin aspart  0-6 Units Subcutaneous TID WC  . isosorbide mononitrate  30 mg Oral Daily  . multivitamin  1 tablet Oral Daily  . [START ON 09/12/2020] pantoprazole  40 mg Intravenous Q12H  . sertraline  200 mg Oral Daily  . sodium chloride flush  3 mL Intravenous Q12H  . traZODone  50 mg Oral QHS   Continuous: . pantoprozole (PROTONIX) infusion 8 mg/hr (09/09/20 0527)    Assessment/Plan: 1) Recurrent GI bleeding. 2) Anemia.  She is now at 7.4 g/dL from 4.7 g/dL.   The patient remains stable.  No reports of any bleeding overnight.    Plan: 1) VCE today. 2) [place] will read the study tomorrow.  LOS: 1 day   HUNG,PATRICK D 09/09/2020, 7:35 AM

## 2020-09-09 NOTE — Progress Notes (Addendum)
[facility] Progress Note  Subjective: seen in room, got 2u prbc's yest and Hb up to 7.4 today. 2L UF on HD yest.   Vitals:   09/09/20 0442 09/09/20 0838 09/09/20 1005 09/09/20 1116  BP: 128/62 119/66  (!) 108/45  Pulse: 88 91  87  Resp: 18     Temp: 98.7 F (37.1 C)   98.7 F (37.1 C)  TempSrc: Oral   Oral  SpO2: 100%   100%  Weight:   58.1 kg   Height:   5\' 7"  (1.702 m)     Exam:   alert, nad   no jvd  Chest cta bilat  Cor reg no RG  Abd soft ntnd no ascites   Ext no LE edema   Alert, NF, ox3   RUE AVF+bruit   Home meds:  - lipitor 10/ coreg 3.125 bid/ hydralazine 100 tid nonhd days/ imdur 30 qd/ sl ntg prn  - trazodone 50 hs/ zoloft 200mg  qd/ neurontin 300mg  bid on TTS and 200 tid non hd days  - pepcid 40 qd  - prn's/ vitamins/ supplements    CXR 3/26 - IMPRESSION: 1. New small left pleural effusion. 2. Cardiac enlargement and pulmonary vascular congestion. 3. Aortic Atherosclerosis       OP HD: [facility] TTS   4h  450/500   51.5kg  2/2 bath  P4  R AVF  Hep none   - mircera 100 q 2    Assessment/ Plan: 1. Severe anemia - Hb 4.7 on presentation, suspected recurrent GIB. She had extensive work up with multiple EGDs/enteroscopies and colonoscopies per GI, most recently Jan 2022.  She has been found to have very degrees of esophagitis and some gastric and small bowel AVMs that were ablated with APC. SP 2u prbc's yesterday and Hb up 7.4 today. Pt feeling better. Seen by GI, plan if for VCE.   2. ESRD - on HD TTS. Had HD here Sat afternoon. Next HD 3/29.  3. HTN - bp's well controlled.  4. Volume - no vol excess on exam. Will ask for standing wt.  5. Depression 6. Anemia ckd - last esa was on 2/24, high dose esa. Have ordered esa for today SQ darbe 200ug x 1.  7. MBD ckd - get records in am      Rob Brennon Otterness 09/09/2020, 1:25 PM   Recent Labs  Lab 09/08/20 1221 09/09/20 0049  K 4.1 3.2*  BUN 38* 15  CREATININE 4.77* 2.58*  CALCIUM 8.6*  8.2*  HGB 4.7* 7.4*   Inpatient medications: . atorvastatin  10 mg Oral QHS  . calcium acetate  1,334 mg Oral Q lunch  . calcium acetate  2,001 mg Oral BID WC  . Chlorhexidine Gluconate Cloth  6 each Topical Q0600  . gabapentin  200 mg Oral 3 times per day on Sun Mon Wed Fri  . gabapentin  300 mg Oral 2 times per day on Tue Thu Sat  . hydrALAZINE  100 mg Oral 3 times per day on Sun Mon Wed Fri  . insulin aspart  0-6 Units Subcutaneous TID WC  . isosorbide mononitrate  30 mg Oral Daily  . multivitamin  1 tablet Oral Daily  . [START ON 09/12/2020] pantoprazole  40 mg Intravenous Q12H  . sertraline  200 mg Oral Daily  . sodium chloride flush  3 mL Intravenous Q12H  . traZODone  50 mg Oral QHS   . pantoprozole (PROTONIX) infusion 8 mg/hr (09/09/20 0527)   acetaminophen **  OR** acetaminophen, calcium acetate, calcium carbonate (dosed in mg elemental calcium), camphor-menthol **AND** hydrOXYzine, docusate sodium, feeding supplement (NEPRO CARB STEADY), hydrALAZINE, methocarbamol, ondansetron **OR** ondansetron (ZOFRAN) IV, sorbitol, zolpidem

## 2020-09-09 NOTE — Progress Notes (Signed)
Progress Note    PEACE NOYES  YDX:[id number] DOB: Feb 19, 1956  DOA: 09/08/2020 PCP: [name], MD    Brief Narrative:     Medical records reviewed and are as summarized below:  Brandy Houston is an 65 y.o. 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  She reports that she is here for a blood transfusion.  She has been having GI bleeding for a couple of months.  It has stopped after prior hospitalization and then started again maybe a month ago.  She had diarrhea prior to it starting.  It seemed to stop with Imodium 3 days ago.    Assessment/Plan:   Principal Problem:   Acute upper GI bleeding Active Problems:   End stage renal disease ([name])   Major depression, recurrent, chronic ([name])   DM type 2 causing ESRD ([name])   Amputation above knee ([name])   ABLA (acute blood loss anemia)   Essential hypertension   Coronary artery disease of native artery of native heart with stable angina pectoris ([name])   Dyslipidemia   Acute GI Bleeding -GI consult -for VCA today  ABLA -Patient's chest pain was most likely caused by anemia secondary to upper GI bleeding.  -Her Hgb decreased from 11.1 on 3/11 to 4.7  -s/p 2 units  ESRD on TTS HD -Patient on chronic TTS HD -Nephrology prn order set utilized  DM -Last A1c was <5  -For now, will order very sensitive-scale SSI without qhs coverage  CAD/PVD -s/p L AKA -Continue Imdur -Continue Neurontin, Robaxin  HLD -Continue Lipitor  HTN -Previously on Coreg but this does not appear to have been continued from last hospitalization - needs reconsideration once more stable -Continue hydralazine on non-HD days  Recent COVID infection -Patient testing positive on 06/20/20 and so does not require repeat testing at this time  Mood d/o -Continue Zoloft, trazodone  Hypokalemia -defer to  renal    Family Communication/Anticipated D/C date and plan/Code Status   DVT prophylaxis: scd Code Status: Full Code.  Disposition Plan: Status is: Inpatient  Remains inpatient appropriate because:Inpatient level of care appropriate due to severity of illness   Dispo: The patient is from: Home              Anticipated d/c is to: Home              Patient currently is not medically stable to d/c.   Difficult to place patient No         Medical Consultants:    GI  renal    Subjective:   No further bleeding  Objective:    Vitals:   09/08/20 2323 09/09/20 0442 09/09/20 0838 09/09/20 1005  BP: 137/87 128/62 119/66   Pulse: 93 88 91   Resp: 16 18    Temp: 98.3 F (36.8 C) 98.7 F (37.1 C)    TempSrc: Oral Oral    SpO2: 100% 100%    Weight:    58.1 kg  Height:    5\' 7"  (1.702 m)    Intake/Output Summary (Last 24 hours) at 09/09/2020 1054 Last data filed at 09/08/2020 2234 Gross per 24 hour  Intake 315 ml  Output 2000 ml  Net -1685 ml   Filed Weights   09/08/20 1018 09/09/20 1005  Weight: 58.1 kg 58.1 kg    Exam:  General:  Appearance:    Chronically ill appearing female in no acute distress     Lungs:     respirations unlabored  Heart:    Normal heart rate. Normal rhythm.    MS:    Amputations noted  Neurologic:   Awake, alert    Data Reviewed:   I have personally reviewed following labs and imaging studies:  Labs: Labs show the following:   Basic Metabolic Panel: Recent Labs  Lab 09/08/20 1050 09/08/20 1221 09/09/20 0049  NA 135 135 137  K 4.0 4.1 3.2*  CL 96* 95* 101  CO2 27 31 29   GLUCOSE 176* 127* 123*  BUN 37* 38* 15  CREATININE 4.71* 4.77* 2.58*  CALCIUM 8.6* 8.6* 8.2*   GFR Estimated Creatinine Clearance: 19.9 mL/min (A) (by C-G formula based on SCr of 2.58 mg/dL (H)). Liver Function Tests: No results for input(s): AST, ALT, ALKPHOS, BILITOT, PROT, ALBUMIN in the last 168 hours. No results for input(s): LIPASE,  AMYLASE in the last 168 hours. No results for input(s): AMMONIA in the last 168 hours. Coagulation profile No results for input(s): INR, PROTIME in the last 168 hours.  CBC: Recent Labs  Lab 09/08/20 1221 09/09/20 0049  WBC 6.5 5.2  NEUTROABS 5.3  --   HGB 4.7* 7.4*  HCT 15.4* 23.0*  MCV 93.3 89.5  PLT 234 167   Cardiac Enzymes: No results for input(s): CKTOTAL, CKMB, CKMBINDEX, TROPONINI in the last 168 hours. BNP (last 3 results) No results for input(s): PROBNP in the last 8760 hours. CBG: Recent Labs  Lab 09/08/20 2321 09/08/20 2339 09/09/20 0640  GLUCAP 69* 96 128*   D-Dimer: No results for input(s): DDIMER in the last 72 hours. Hgb A1c: No results for input(s): HGBA1C in the last 72 hours. Lipid Profile: No results for input(s): CHOL, HDL, LDLCALC, TRIG, CHOLHDL, LDLDIRECT in the last 72 hours. Thyroid function studies: No results for input(s): TSH, T4TOTAL, T3FREE, THYROIDAB in the last 72 hours.  Invalid input(s): FREET3 Anemia work up: No results for input(s): VITAMINB12, FOLATE, FERRITIN, TIBC, IRON, RETICCTPCT in the last 72 hours. Sepsis Labs: Recent Labs  Lab 09/08/20 1221 09/09/20 0049  WBC 6.5 5.2    Microbiology No results found for this or any previous visit (from the past 240 hour(s)).  Procedures and diagnostic studies:  DG Chest Port 1 View  Result Date: 09/08/2020 CLINICAL DATA:  Chest pain. EXAM: PORTABLE CHEST 1 VIEW COMPARISON:  10/22/2019 FINDINGS: Marked cardiac enlargement. Aortic atherosclerosis. Small left pleural effusion is new from previous exam. Mild diffuse pulmonary vascular congestion. IMPRESSION: 1. New small left pleural effusion. 2. Cardiac enlargement and pulmonary vascular congestion. 3.  Aortic Atherosclerosis (ICD10-I70.0). Electronically Signed   By: Kerby Moors M.D.   On: 09/08/2020 13:22    Medications:   . atorvastatin  10 mg Oral QHS  . calcium acetate  1,334 mg Oral Q lunch  . calcium acetate  2,001 mg  Oral BID WC  . Chlorhexidine Gluconate Cloth  6 each Topical Q0600  . gabapentin  200 mg Oral 3 times per day on Sun Mon Wed Fri  . gabapentin  300 mg Oral 2 times per day on Tue Thu Sat  . hydrALAZINE  100 mg Oral 3 times per day on Sun Mon Wed Fri  . insulin aspart  0-6 Units Subcutaneous TID WC  . isosorbide mononitrate  30 mg Oral Daily  . multivitamin  1 tablet Oral Daily  . [START ON 09/12/2020] pantoprazole  40 mg  Intravenous Q12H  . sertraline  200 mg Oral Daily  . sodium chloride flush  3 mL Intravenous Q12H  . traZODone  50 mg Oral QHS   Continuous Infusions: . pantoprozole (PROTONIX) infusion 8 mg/hr (09/09/20 0527)     LOS: 1 day   Geradine Girt  [facility]   How to contact the [facility] Attending or Consulting provider [place] or covering provider during after hours [place], for this patient?  1. Check the care team in [facility] and look for a) attending/consulting TRH provider listed and b) the [facility] team listed 2. Log into [email or web address] and use [place]'s universal password to access. If you do not have the password, please contact the hospital operator. 3. Locate the [facility] provider you are looking for under [facility] and page to a number that you can be directly reached. 4. If you still have difficulty reaching the provider, please page the [facility] (Director on Call) for the Hospitalists listed on amion for assistance.  09/09/2020, 10:54 AM

## 2020-09-09 NOTE — Progress Notes (Signed)
Hypoglycemic Event  CBG: 69  Treatment: 4 oz juice/soda  Symptoms: None  Follow-up CBG: Time:2339 CBG Result:96  Possible Reasons for Event: Inadequate meal intake   Comments/MD notified:    Kathreen Cosier

## 2020-09-10 ENCOUNTER — Inpatient Hospital Stay (HOSPITAL_COMMUNITY): Payer: Medicare Other

## 2020-09-10 LAB — GLUCOSE, CAPILLARY
Glucose-Capillary: 114 mg/dL — ABNORMAL HIGH (ref 70–99)
Glucose-Capillary: 186 mg/dL — ABNORMAL HIGH (ref 70–99)
Glucose-Capillary: 127 mg/dL — ABNORMAL HIGH (ref 70–99)
Glucose-Capillary: 143 mg/dL — ABNORMAL HIGH (ref 70–99)

## 2020-09-10 LAB — BASIC METABOLIC PANEL
Anion gap: 6 (ref 5–15)
BUN: 25 mg/dL — ABNORMAL HIGH (ref 8–23)
CO2: 28 mmol/L (ref 22–32)
Calcium: 8.4 mg/dL — ABNORMAL LOW (ref 8.9–10.3)
Chloride: 103 mmol/L (ref 98–111)
Creatinine, Ser: 4.46 mg/dL — ABNORMAL HIGH (ref 0.44–1.00)
GFR, Estimated: 10 mL/min — ABNORMAL LOW (ref >60)
Glucose, Bld: 148 mg/dL — ABNORMAL HIGH (ref 70–99)
Potassium: 3.5 mmol/L (ref 3.5–5.1)
Sodium: 137 mmol/L (ref 135–145)

## 2020-09-10 LAB — CBC
HCT: 23.4 % — ABNORMAL LOW (ref 36.0–46.0)
Hemoglobin: 7.5 g/dL — ABNORMAL LOW (ref 12.0–15.0)
MCH: 29.3 pg (ref 26.0–34.0)
MCHC: 32.1 g/dL (ref 30.0–36.0)
MCV: 91.4 fL (ref 80.0–100.0)
Platelets: 180 10*3/uL (ref 150–400)
RBC: 2.56 MIL/uL — ABNORMAL LOW (ref 3.87–5.11)
RDW: 17.9 % — ABNORMAL HIGH (ref 11.5–15.5)
WBC: 6.4 10*3/uL (ref 4.0–10.5)
nRBC: 0 % (ref 0.0–0.2)

## 2020-09-10 LAB — PHOSPHORUS: Phosphorus: 3 mg/dL (ref 2.5–4.6)

## 2020-09-10 LAB — HEPATITIS B SURFACE ANTIBODY, QUANTITATIVE: Hep B S AB Quant (Post): <3.1 m[IU]/mL — ABNORMAL LOW (ref >9.9)

## 2020-09-10 MED ORDER — PANTOPRAZOLE SODIUM 40 MG PO TBEC
40.0000 mg | DELAYED_RELEASE_TABLET | Freq: Two times a day (BID) | ORAL | Status: DC
  Administered 2020-09-10: 40 mg via ORAL
  Filled 2020-09-10: qty 1

## 2020-09-10 MED ORDER — CALCITRIOL 0.5 MCG PO CAPS: 2.0000 ug | ORAL_CAPSULE | ORAL | Status: DC

## 2020-09-10 MED ORDER — BOOST / RESOURCE BREEZE PO LIQD CUSTOM
1.0000 | Freq: Two times a day (BID) | ORAL | Status: DC
  Administered 2020-09-10 (×2): 1 via ORAL

## 2020-09-10 NOTE — Progress Notes (Addendum)
Daily Rounding Note  09/10/2020, 10:44 AM  LOS: 2 days   SUBJECTIVE:   Chief complaint: gi bleed.  Anemia.       Stools still dark but not as dark as they were a few days ago.  Overall feeling pretty good.  Eating well.  OBJECTIVE:         Vital signs in last 24 hours:    Temp:  [97.7 F (36.5 C)-98.7 F (37.1 C)] 98.6 F (37 C) (03/28 0434) Pulse Rate:  [87-98] 96 (03/28 0434) Resp:  [18] 18 (03/28 0434) BP: (108-138)/(45-91) 138/72 (03/28 0434) SpO2:  [97 %-100 %] 97 % (03/28 0434) Last BM Date: 09/09/20 Filed Weights   09/08/20 1018 09/09/20 1005  Weight: 58.1 kg 58.1 kg   General: Looks chronically ill, comfortable. Heart: RRR with systolic murmur Chest: No labored breathing.  Lungs clear bilaterally Abdomen: Soft without tenderness.  Active bowel sounds. Extremities: Thin, no edema. Neuro/Psych: Alert.  Oriented x3.  No tremors, no gross weakness.  Intake/Output from previous day: 03/27 0701 - 03/28 0700 In: 80 [I.V.:80] Out: -   Intake/Output this shift: No intake/output data recorded.  Lab Results: Recent Labs    09/08/20 1221 09/09/20 0049 09/10/20 0749  WBC 6.5 5.2 6.4  HGB 4.7* 7.4* 7.5*  HCT 15.4* 23.0* 23.4*  PLT 234 167 180   BMET Recent Labs    09/08/20 1221 09/09/20 0049 09/10/20 0749  NA 135 137 137  K 4.1 3.2* 3.5  CL 95* 101 103  CO2 31 29 28   GLUCOSE 127* 123* 148*  BUN 38* 15 25*  CREATININE 4.77* 2.58* 4.46*  CALCIUM 8.6* 8.2* 8.4*   LFT No results for input(s): PROT, ALBUMIN, AST, ALT, ALKPHOS, BILITOT, BILIDIR, IBILI in the last 72 hours. PT/INR No results for input(s): LABPROT, INR in the last 72 hours. Hepatitis Panel Recent Labs    09/08/20 2253  HEPBSAG NON REACTIVE    Studies/Results: DG Chest Port 1 View  Result Date: 09/08/2020 CLINICAL DATA:  Chest pain. EXAM: PORTABLE CHEST 1 VIEW COMPARISON:  10/22/2019 FINDINGS: Marked cardiac enlargement.  Aortic atherosclerosis. Small left pleural effusion is new from previous exam. Mild diffuse pulmonary vascular congestion. IMPRESSION: 1. New small left pleural effusion. 2. Cardiac enlargement and pulmonary vascular congestion. 3.  Aortic Atherosclerosis (ICD10-I70.0). Electronically Signed   By: Kerby Moors M.D.   On: 09/08/2020 13:22    ASSESMENT:   *    Melena, hematochezia.  Dark stool, CGE. Multiple prior EGDs, enteroscopy's, colonoscopies.  Studies have revealed varying degrees of esophagitis, bleeding gastric/small bowel AVMs which were ablated. Negative bleeding scan 06/2020.  No previous VCE.   VCE, images downloaded, await reading.  *    Acute on chronic anemia.  Hb 4.7 >> 2 PRBCs >> 7.5.  Baseline Hgb 7 to 8.  Has required PRBCs and Feraheme in past.   Chronic po iron.    *   ESRD.  TTS HD.    *   Hep C treated 2016. LFTs, platelets normal.  Liver wnl on CT of 01/2020.    PLAN   *   Await reading of VCE. Switched PPI to Protonix 40 po bid.      Azucena Freed  09/10/2020, 10:44 AM Phone [phone]    Attending physician's note   I have taken an interval history, reviewed the chart and examined the patient. I agree with the Advanced Practitioner's note, impression and recommendations.  VCE: Very limited examination.  Capsule got lodged in the esophagus x 4.31 hrs.  Then got retained in stomach for 5-1/2 hours with food material.  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  No active bleeding although limited examination.  Proximal small bowel without any abn.  Patient does have occasional dysphagia.  No masses were noted in the esophagus.  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  Had mild abdominal pain/discomfort.  I have ordered x-ray KUB to make sure that she has passed capsule.  No further bleeding.  She wants to avoid endoscopic procedures this adm.  She had several EGDs/enteroscopy's/recent colonoscopy/CT scan which  were reviewed.  Plan: -Hold off on any endoscopic procedures this adm. -Check x-ray KUB to ensure she has passed capsule. -Avoid nonsteroidals -Recheck CBC in a.m. If Hb stable, can D/C with GI FU in 2-3 weeks.  Can consider VCE with prep as outpt (if needed) -Continue iron/vitC -Continue Protonix 40 BID for now. -Trend CBC Qweekly outpt at time of HD and transfuse PRN.  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   Carmell Austria, MD [facility] [phone]

## 2020-09-10 NOTE — Telephone Encounter (Signed)
No additional phone calls from patient or return call from patient.

## 2020-09-10 NOTE — Progress Notes (Signed)
Progress Note    Brandy Houston  YQM:[id number] DOB: 1956/03/03  DOA: 09/08/2020 PCP: [name], MD    Brief Narrative:     Medical records reviewed and are as summarized below:  Brandy Houston is an 65 y.o. 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  She reports that she is here for a blood transfusion.  She has been having GI bleeding for a couple of months.  It has stopped after prior hospitalization and then started again maybe a month ago.  She had diarrhea prior to it starting.  It seemed to stop with Imodium 3 days ago.    Assessment/Plan:   Principal Problem:   Acute upper GI bleeding Active Problems:   End stage renal disease ([name])   Major depression, recurrent, chronic ([name])   DM type 2 causing ESRD ([name])   Amputation above knee ([name])   ABLA (acute blood loss anemia)   Essential hypertension   Coronary artery disease of native artery of native heart with stable angina pectoris ([name])   Dyslipidemia   Acute GI Bleeding -GI consult -await VCA results  ABLA -Patient's chest pain was most likely caused by anemia secondary to upper GI bleeding.  -Her Hgb decreased from 11.1 on 3/11 to 4.7  -s/p 2 units-- hgb stable  ESRD on TTS HD -Patient on chronic TTS HD -Nephrology prn order set utilized  DM -Last A1c was <5  -For now, will order very sensitive-scale SSI without qhs coverage -change to carb mod diet  CAD/PVD -s/p L AKA -Continue Imdur -Continue Neurontin, Robaxin  HLD -Continue Lipitor  HTN -Previously on Coreg but this does not appear to have been continued from last hospitalization - needs reconsideration once more stable -Continue hydralazine on non-HD days  Recent COVID infection -Patient testing positive on 06/20/20 and so does not require repeat testing at this time  Mood d/o -Continue  Zoloft, trazodone  Hypokalemia -resolved    Family Communication/Anticipated D/C date and plan/Code Status   DVT prophylaxis: scd Code Status: Full Code.  Disposition Plan: Status is: Inpatient  Remains inpatient appropriate because:Inpatient level of care appropriate due to severity of illness   Dispo: The patient is from: Home              Anticipated d/c is to: Home                Difficult to place patient No         Medical Consultants:    GI  renal    Subjective:  Await VCA results  Objective:    Vitals:   09/09/20 1828 09/10/20 0014 09/10/20 0434 09/10/20 1152  BP: (!) 135/51 118/62 138/72 129/81  Pulse: 98 94 96 95  Resp: 18 18 18 16   Temp: 97.7 F (36.5 C) 98.5 F (36.9 C) 98.6 F (37 C) 98.5 F (36.9 C)  TempSrc: Axillary Oral Oral Oral  SpO2: 99% 97% 97% 93%  Weight:      Height:        Intake/Output Summary (Last 24 hours) at 09/10/2020 1439 Last data filed at 09/09/2020 1500 Gross per 24 hour  Intake 80 ml  Output --  Net 80 ml   Filed Weights   09/08/20 1018 09/09/20 1005  Weight: 58.1 kg 58.1 kg    Exam: In bed, NAD  Data Reviewed:   I have personally reviewed following labs and imaging studies:  Labs: Labs show the following:   Basic Metabolic Panel: Recent Labs  Lab 09/08/20 1050 09/08/20 1221 09/09/20 0049 09/10/20 0749  NA 135 135 137 137  K 4.0 4.1 3.2* 3.5  CL 96* 95* 101 103  CO2 27 31 29 28   GLUCOSE 176* 127* 123* 148*  BUN 37* 38* 15 25*  CREATININE 4.71* 4.77* 2.58* 4.46*  CALCIUM 8.6* 8.6* 8.2* 8.4*   GFR Estimated Creatinine Clearance: 11.5 mL/min (A) (by C-G formula based on SCr of 4.46 mg/dL (H)). Liver Function Tests: No results for input(s): AST, ALT, ALKPHOS, BILITOT, PROT, ALBUMIN in the last 168 hours. No results for input(s): LIPASE, AMYLASE in the last 168 hours. No results for input(s): AMMONIA in the last 168 hours. Coagulation profile No results for input(s): INR, PROTIME in  the last 168 hours.  CBC: Recent Labs  Lab 09/08/20 1221 09/09/20 0049 09/10/20 0749  WBC 6.5 5.2 6.4  NEUTROABS 5.3  --   --   HGB 4.7* 7.4* 7.5*  HCT 15.4* 23.0* 23.4*  MCV 93.3 89.5 91.4  PLT 234 167 180   Cardiac Enzymes: No results for input(s): CKTOTAL, CKMB, CKMBINDEX, TROPONINI in the last 168 hours. BNP (last 3 results) No results for input(s): PROBNP in the last 8760 hours. CBG: Recent Labs  Lab 09/09/20 1114 09/09/20 1629 09/09/20 2147 09/10/20 0611 09/10/20 1103  GLUCAP 110* 158* 123* 114* 186*   D-Dimer: No results for input(s): DDIMER in the last 72 hours. Hgb A1c: No results for input(s): HGBA1C in the last 72 hours. Lipid Profile: No results for input(s): CHOL, HDL, LDLCALC, TRIG, CHOLHDL, LDLDIRECT in the last 72 hours. Thyroid function studies: No results for input(s): TSH, T4TOTAL, T3FREE, THYROIDAB in the last 72 hours.  Invalid input(s): FREET3 Anemia work up: No results for input(s): VITAMINB12, FOLATE, FERRITIN, TIBC, IRON, RETICCTPCT in the last 72 hours. Sepsis Labs: Recent Labs  Lab 09/08/20 1221 09/09/20 0049 09/10/20 0749  WBC 6.5 5.2 6.4    Microbiology No results found for this or any previous visit (from the past 240 hour(s)).  Procedures and diagnostic studies:  No results found.  Medications:   . atorvastatin  10 mg Oral QHS  . [START ON 09/11/2020] calcitRIOL  2 mcg Oral Q T,Th,Sat-1800  . calcium acetate  1,334 mg Oral Q lunch  . calcium acetate  2,001 mg Oral BID WC  . Chlorhexidine Gluconate Cloth  6 each Topical Q0600  . feeding supplement  1 Container Oral BID BM  . gabapentin  200 mg Oral 3 times per day on Sun Mon Wed Fri  . gabapentin  300 mg Oral 2 times per day on Tue Thu Sat  . hydrALAZINE  100 mg Oral 3 times per day on Sun Mon Wed Fri  . insulin aspart  0-6 Units Subcutaneous TID WC  . isosorbide mononitrate  30 mg Oral Daily  . multivitamin  1 tablet Oral Daily  . pantoprazole  40 mg Oral BID  .  sertraline  200 mg Oral Daily  . sodium chloride flush  3 mL Intravenous Q12H  . traZODone  50 mg Oral QHS   Continuous Infusions:    LOS: 2 days   Geradine Girt  [facility]   How to contact the [facility] Attending or Consulting provider [place] or covering provider during after hours [place], for this patient?  1. Check the care team  in [facility] and look for a) attending/consulting TRH provider listed and b) the [facility] team listed 2. Log into [email or web address] and use [place]'s universal password to access. If you do not have the password, please contact the hospital operator. 3. Locate the [facility] provider you are looking for under [facility] and page to a number that you can be directly reached. 4. If you still have difficulty reaching the provider, please page the [facility] (Director on Call) for the Hospitalists listed on amion for assistance.  09/10/2020, 2:39 PM

## 2020-09-10 NOTE — Progress Notes (Signed)
[facility] Progress Note  Subjective: Seen in room, no complaints today   Vitals:   09/09/20 1538 09/09/20 1828 09/10/20 0014 09/10/20 0434  BP: (!) 112/91 (!) 135/51 118/62 138/72  Pulse:  98 94 96  Resp:  18 18 18   Temp:  97.7 F (36.5 C) 98.5 F (36.9 C) 98.6 F (37 C)  TempSrc:  Axillary Oral Oral  SpO2:  99% 97% 97%  Weight:      Height:        Exam: GEN: sitting in bed, nad ENT: no nasal discharge, mmm EYES: no scleral icterus, eomi CV: normal rate PULM: no iwob, bilateral chest rise ABD: NABS, non-distended SKIN: no rashes or jaundice EXT: no edema, warm and well perfused RUE AVF+bruit   Home meds:  - lipitor 10/ coreg 3.125 bid/ hydralazine 100 tid nonhd days/ imdur 30 qd/ sl ntg prn  - trazodone 50 hs/ zoloft 200mg  qd/ neurontin 300mg  bid on TTS and 200 tid non hd days  - pepcid 40 qd  - prn's/ vitamins/ supplements    CXR 3/26 - IMPRESSION: 1. New small left pleural effusion. 2. Cardiac enlargement and pulmonary vascular congestion. 3. Aortic Atherosclerosis       OP HD: [facility] TTS   4h  450/500   51.5kg  2/2 bath  P4  R AVF  Hep none   - mircera 100 q 2    Assessment/ Plan: 1. Severe anemia - Hb 4.7 on presentation, suspected recurrent GIB. She had extensive work up with multiple EGDs/enteroscopies and colonoscopies per GI, most recently Jan 2022.  She has been found to have very degrees of esophagitis and some gastric and small bowel AVMs that were ablated with APC. SP 2u prbc's 3/26. Hgb stable today 2. ESRD - on HD TTS. Continue HD per schedule 3. HTN - bp's well controlled.  4. Volume - appears euvolemic 5. Depression 6. Anemia ckd - last esa was on 2/24, high dose esa. Continue w/ high dose darbe 200ug weekly while admitted.  7. MBD ckd - calcitriol 58mcg w/ HD. On calcium acetate 3 tabs w/ meals. Will add on and restart if needed.      Brandy Houston  09/10/2020, 10:34 AM   Recent Labs  Lab 09/09/20 0049  09/10/20 0749  K 3.2* 3.5  BUN 15 25*  CREATININE 2.58* 4.46*  CALCIUM 8.2* 8.4*  HGB 7.4* 7.5*   Inpatient medications: . atorvastatin  10 mg Oral QHS  . calcium acetate  1,334 mg Oral Q lunch  . calcium acetate  2,001 mg Oral BID WC  . Chlorhexidine Gluconate Cloth  6 each Topical Q0600  . gabapentin  200 mg Oral 3 times per day on Sun Mon Wed Fri  . gabapentin  300 mg Oral 2 times per day on Tue Thu Sat  . hydrALAZINE  100 mg Oral 3 times per day on Sun Mon Wed Fri  . insulin aspart  0-6 Units Subcutaneous TID WC  . isosorbide mononitrate  30 mg Oral Daily  . multivitamin  1 tablet Oral Daily  . [START ON 09/12/2020] pantoprazole  40 mg Intravenous Q12H  . sertraline  200 mg Oral Daily  . sodium chloride flush  3 mL Intravenous Q12H  . traZODone  50 mg Oral QHS   . pantoprozole (PROTONIX) infusion 8 mg/hr (09/10/20 0247)   acetaminophen **OR** acetaminophen, calcium acetate, calcium carbonate (dosed in mg elemental calcium), camphor-menthol **AND** hydrOXYzine, docusate sodium, feeding supplement (NEPRO CARB STEADY), hydrALAZINE,  methocarbamol, ondansetron **OR** ondansetron (ZOFRAN) IV, sorbitol, zolpidem

## 2020-09-10 NOTE — Progress Notes (Signed)
Initial Nutrition Assessment  DOCUMENTATION CODES:   Non-severe (moderate) malnutrition in context of chronic illness  INTERVENTION:    Boost Breeze po BID, each supplement provides 250 kcal and 9 grams of protein.  Continue Rena-vit daily.  NUTRITION DIAGNOSIS:   Moderate Malnutrition related to chronic illness (ESRD on HD) as evidenced by moderate fat depletion,moderate muscle depletion.  GOAL:   Patient will meet greater than or equal to 90% of their needs  MONITOR:   PO intake,Supplement acceptance,Labs  REASON FOR ASSESSMENT:   Malnutrition Screening Tool    ASSESSMENT:   65 yo female admitted with recurrent GI bleed, symptomatic anemia. PMH includes GI bleed, ESRD on HD, gastric and duodenal AVMs, DM, PVD, AKA, CAD, HTN.   Patient reports minimal intake for the past month due to recent "GI issues". Since GI procedure she is able to eat again. She c/o diarrhea when she drinks Nepro Shake supplements, so she has not been drinking them. She agreed to try Boost Breeze supplements. Her first meal in the hospital was yesterday and she says she has been eating what they bring her. She says her EDW is 51.3 kg.  Labs reviewed.  CBG: 114-186  Medications reviewed and include calcitriol, phoslo, novolog, rena-vit, IV protonix.  Weight history reviewed. Weight has ranged from 54.4 kg to 61.3 kg over the past 3 months. Fluctuations in weight r/t fluid status with history of ESRD on HD.   Patient meets criteria for moderate malnutrition related to chronic illness. Suspect this has been exacerbated by acute GI issues and recent poor intake.   NUTRITION - FOCUSED PHYSICAL EXAM:  Flowsheet Row Most Recent Value  Orbital Region No depletion  Upper Arm Region Severe depletion  Thoracic and Lumbar Region Moderate depletion  Buccal Region Moderate depletion  Temple Region Moderate depletion  Clavicle Bone Region Severe depletion  Clavicle and Acromion Bone Region Severe  depletion  Scapular Bone Region Severe depletion  Dorsal Hand Moderate depletion  Patellar Region Severe depletion  Anterior Thigh Region Severe depletion  Posterior Calf Region Severe depletion  Edema (RD Assessment) None  Hair Reviewed  Eyes Reviewed  Mouth Reviewed  Skin Reviewed  Nails Reviewed       Diet Order:   Diet Order            Diet renal with fluid restriction Fluid restriction: 1200 mL Fluid; Room service appropriate? Yes; Fluid consistency: Thin  Diet effective now                 EDUCATION NEEDS:   Education needs have been addressed  Skin:  Skin Assessment: Reviewed RN Assessment  Last BM:  3/27  Height:   Ht Readings from Last 1 Encounters:  09/09/20 5\' 7"  (1.702 m)    Weight:   Wt Readings from Last 1 Encounters:  09/09/20 58.1 kg    BMI:  Body mass index is 20.06 kg/m.  Estimated Nutritional Needs:   Kcal:  5465-0354  Protein:  80-90 gm  Fluid:  1 L + UOP    Lucas Mallow, RD, LDN, CNSC Please refer to Amion for contact information.

## 2020-09-11 ENCOUNTER — Encounter (HOSPITAL_COMMUNITY): Payer: Self-pay | Admitting: Gastroenterology

## 2020-09-11 LAB — RENAL FUNCTION PANEL
Albumin: 2 g/dL — ABNORMAL LOW (ref 3.5–5.0)
Anion gap: 8 (ref 5–15)
BUN: 30 mg/dL — ABNORMAL HIGH (ref 8–23)
CO2: 27 mmol/L (ref 22–32)
Calcium: 8.7 mg/dL — ABNORMAL LOW (ref 8.9–10.3)
Chloride: 102 mmol/L (ref 98–111)
Creatinine, Ser: 5.01 mg/dL — ABNORMAL HIGH (ref 0.44–1.00)
GFR, Estimated: 9 mL/min — ABNORMAL LOW (ref >60)
Glucose, Bld: 115 mg/dL — ABNORMAL HIGH (ref 70–99)
Phosphorus: 2.9 mg/dL (ref 2.5–4.6)
Potassium: 3.5 mmol/L (ref 3.5–5.1)
Sodium: 137 mmol/L (ref 135–145)

## 2020-09-11 LAB — CBC
HCT: 23.7 % — ABNORMAL LOW (ref 36.0–46.0)
Hemoglobin: 7.4 g/dL — ABNORMAL LOW (ref 12.0–15.0)
MCH: 29 pg (ref 26.0–34.0)
MCHC: 31.2 g/dL (ref 30.0–36.0)
MCV: 92.9 fL (ref 80.0–100.0)
Platelets: 182 10*3/uL (ref 150–400)
RBC: 2.55 MIL/uL — ABNORMAL LOW (ref 3.87–5.11)
RDW: 18.2 % — ABNORMAL HIGH (ref 11.5–15.5)
WBC: 7.3 10*3/uL (ref 4.0–10.5)
nRBC: 0 % (ref 0.0–0.2)

## 2020-09-11 LAB — GLUCOSE, CAPILLARY
Glucose-Capillary: 110 mg/dL — ABNORMAL HIGH (ref 70–99)
Glucose-Capillary: 148 mg/dL — ABNORMAL HIGH (ref 70–99)

## 2020-09-11 MED ORDER — CALCITRIOL 0.5 MCG PO CAPS
ORAL_CAPSULE | ORAL | Status: AC
  Administered 2020-09-11: 2 ug via ORAL
  Filled 2020-09-11: qty 4

## 2020-09-11 NOTE — Discharge Instructions (Signed)
fup appt w GI Dr Silverio Decamp on 5/19.  May pursue repeat, outpt VCE.

## 2020-09-11 NOTE — Progress Notes (Signed)
Hemodialysis- Treatment initiated via L AVF using 15g needles x2. Initial bfr 400- however patient bending access arm causing bleeding around arterial needle site. Retaped and gauze applied. Discussed importance of keeping access arm still during treatment. BFR reduced to 350. Continue to monitor closely.

## 2020-09-11 NOTE — Discharge Summary (Signed)
Physician Discharge Summary  Brandy Houston CWC:[id number] DOB: Aug 08, 1955 DOA: 09/08/2020  PCP: [name], MD  Admit date: 09/08/2020 Discharge date: 09/11/2020  Admitted From: home Discharge disposition: home   Recommendations for Outpatient Follow-Up:   1. Cbc 1 week 2. Repeat VCA outpateint   Discharge Diagnosis:   Principal Problem:   Acute upper GI bleeding Active Problems:   End stage renal disease ([name])   Major depression, recurrent, chronic ([name])   DM type 2 causing ESRD ([name])   Amputation above knee ([name])   ABLA (acute blood loss anemia)   Essential hypertension   Coronary artery disease of native artery of native heart with stable angina pectoris ([place])   Dyslipidemia    Discharge Condition: Improved.  Diet recommendation: renal/carb mod  Wound care: None.  Code status: Full.   History of Present Illness:   Brandy Houston is a 65 y.o. female with medical history significant of ESRD on HD; DM; recurrent GI bleeding with h/o gastric and duodenal AVMs; DM; PVD s/p AKA; CAD; and HTN presenting with symptomatic anemia.  She reports that she is here for a blood transfusion.  She has been having GI bleeding for a couple of months.  It has stopped after prior hospitalization and then started again maybe a month ago.  She had diarrhea prior to it starting.  It seemed to stop with Imodium 3 days ago.     Hospital Course by Problem:   Acute GI Bleeding -GI consult -VCA results: VCE: Very limited examination.  Capsule got lodged in the esophagus x 4.31 hrs.  Then got retained in stomach for 5-1/2 hours with food material.  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  No active bleeding although limited examination.  Proximal small bowel without any abn.  ABLA -Patient'schest pain wasmost likely caused by anemia secondary to upper GI bleeding.  -HerHgb decreased from 11.1on 3/11to4.7 -s/p 2 units-- hgb  stable  ESRD on TTS HD -Patient on chronic TTS HD -Nephrology prn order set utilized  DM -Last A1c was <5  -diet controlled  CAD/PVD -s/p L AKA -Continue Imdur -Continue Neurontin, Robaxin  HLD -Continue Lipitor  HTN -Previously on Coreg but this does not appear to have been continued from last hospitalization - needs reconsideration once more stable -Continue hydralazine on non-HD days  Recent COVID infection -Patient testing positive on 06/20/20 and so does not require repeat testing at this time  Mood d/o -Continue Zoloft, trazodone  Hypokalemia -resolved    Medical Consultants:   GI renal   Discharge Exam:   Vitals:   09/11/20 1040 09/11/20 1042  BP: (!) 178/60 (!) 158/60  Pulse:    Resp: 16   Temp: 98.1 F (36.7 C)   SpO2: 96%    Vitals:   09/11/20 1000 09/11/20 1030 09/11/20 1040 09/11/20 1042  BP: (!) 142/59 (!) 160/59 (!) 178/60 (!) 158/60  Pulse:      Resp: '15 16 16   ' Temp:   98.1 F (36.7 C)   TempSrc:   Oral   SpO2:   96%   Weight:   51.5 kg   Height:        General exam: Appears calm and comfortable.    The results of significant diagnostics from this hospitalization (including imaging, microbiology, ancillary and laboratory) are listed below for reference.     Procedures and Diagnostic Studies:   DG Chest Port 1 View  Result Date: 09/08/2020 CLINICAL DATA:  Chest pain. EXAM: PORTABLE CHEST 1 VIEW COMPARISON:  10/22/2019 FINDINGS: Marked cardiac enlargement. Aortic atherosclerosis. Small left pleural effusion is new from previous exam. Mild diffuse pulmonary vascular congestion. IMPRESSION: 1. New small left pleural effusion. 2. Cardiac enlargement and pulmonary vascular congestion. 3.  Aortic Atherosclerosis (ICD10-I70.0). Electronically Signed   By: Kerby Moors M.D.   On: 09/08/2020 13:22     Labs:   Basic Metabolic Panel: Recent Labs  Lab 09/08/20 1050 09/08/20 1221 09/09/20 0049 09/10/20 0749 09/10/20 1040  09/11/20 0635  NA 135 135 137 137  --  137  K 4.0 4.1 3.2* 3.5  --  3.5  CL 96* 95* 101 103  --  102  CO2 '27 31 29 28  ' --  27  GLUCOSE 176* 127* 123* 148*  --  115*  BUN 37* 38* 15 25*  --  30*  CREATININE 4.71* 4.77* 2.58* 4.46*  --  5.01*  CALCIUM 8.6* 8.6* 8.2* 8.4*  --  8.7*  PHOS  --   --   --   --  3.0 2.9   GFR Estimated Creatinine Clearance: 9.1 mL/min (A) (by C-G formula based on SCr of 5.01 mg/dL (H)). Liver Function Tests: Recent Labs  Lab 09/11/20 0635  ALBUMIN 2.0*   No results for input(s): LIPASE, AMYLASE in the last 168 hours. No results for input(s): AMMONIA in the last 168 hours. Coagulation profile No results for input(s): INR, PROTIME in the last 168 hours.  CBC: Recent Labs  Lab 09/08/20 1221 09/09/20 0049 09/10/20 0749 09/11/20 0635  WBC 6.5 5.2 6.4 7.3  NEUTROABS 5.3  --   --   --   HGB 4.7* 7.4* 7.5* 7.4*  HCT 15.4* 23.0* 23.4* 23.7*  MCV 93.3 89.5 91.4 92.9  PLT 234 167 180 182   Cardiac Enzymes: No results for input(s): CKTOTAL, CKMB, CKMBINDEX, TROPONINI in the last 168 hours. BNP: Invalid input(s): POCBNP CBG: Recent Labs  Lab 09/10/20 1103 09/10/20 1559 09/10/20 2110 09/11/20 0609 09/11/20 1115  GLUCAP 186* 127* 143* 110* 148*   D-Dimer No results for input(s): DDIMER in the last 72 hours. Hgb A1c No results for input(s): HGBA1C in the last 72 hours. Lipid Profile No results for input(s): CHOL, HDL, LDLCALC, TRIG, CHOLHDL, LDLDIRECT in the last 72 hours. Thyroid function studies No results for input(s): TSH, T4TOTAL, T3FREE, THYROIDAB in the last 72 hours.  Invalid input(s): FREET3 Anemia work up No results for input(s): VITAMINB12, FOLATE, FERRITIN, TIBC, IRON, RETICCTPCT in the last 72 hours. Microbiology No results found for this or any previous visit (from the past 240 hour(s)).   Discharge Instructions:   Discharge Instructions    Discharge instructions   Complete by: As directed    Renal/carb mod   Increase  activity slowly   Complete by: As directed      Allergies as of 09/11/2020      Reactions   Compazine [prochlorperazine] Shortness Of Breath, Swelling, Other (See Comments)   TONGUE SWELLS   Shellfish-derived Products Anaphylaxis   Iodinated Diagnostic Agents Hives, Rash   Omnipaque [iohexol] Hives   Tape Other (See Comments)   Owens Shark, paper tape caused skin irritation   Sulfa Antibiotics Rash      Medication List    STOP taking these medications   carvedilol 3.125 MG tablet Commonly known as: COREG     TAKE these medications   Accu-Chek Guide test strip Generic drug: glucose blood Check blood sugar up to 4 times a day  Accu-Chek Guide w/Device Kit 1 Device by Does not apply route daily.   accu-chek softclix lancets Check sugars TID for E11.65   Accu-Chek Softclix Lancets lancets Check sugars three times a day for E11.65   acetaminophen 500 MG tablet Commonly known as: TYLENOL Take 1,000 mg by mouth every 6 (six) hours as needed (for pain).   ascorbic acid 500 MG tablet Commonly known as: VITAMIN C Take 1 tablet (500 mg total) by mouth daily.   atorvastatin 10 MG tablet Commonly known as: LIPITOR Take 1 tablet (10 mg total) by mouth at bedtime.   blood glucose meter kit and supplies Kit Dispense based on patient and insurance preference. Use up to four times daily as directed   calcium acetate 667 MG capsule Commonly known as: PHOSLO Take 1,334-2,001 mg by mouth See admin instructions. Take 2,001 mg by mouth in the morning with breakfast, 1,334 mg with lunch (IF consumed), and 2,001 mg with dinner/evening meal   diltiazem 2 % Gel Apply 1 application topically 3 (three) times daily. Apply to affected area on anus What changed:   when to take this  reasons to take this  additional instructions   diphenhydrAMINE 25 mg capsule Commonly known as: BENADRYL Take 25 mg by mouth every 6 (six) hours as needed for allergies.   famotidine 40 MG  tablet Commonly known as: Pepcid Take 1 tablet (40 mg total) by mouth daily.   gabapentin 300 MG capsule Commonly known as: NEURONTIN Take 300 mg by mouth See admin instructions. Take 300 mg by mouth with lunch on Tuesdays, Thursdays, & Saturdays   gabapentin 100 MG capsule Commonly known as: NEURONTIN Take 200 mg by mouth See admin instructions. Take 2 capsules (200 mg) by mouth 3 times on Sundays, Mondays, Wednesdays, & Fridays   hydrALAZINE 100 MG tablet Commonly known as: APRESOLINE Take 100 mg by mouth See admin instructions. Take 100 mg by mouth three times a day on Sun/Mon/Wed/Fri (NON-DIALYSIS DAYS)   hydrocortisone 25 MG suppository Commonly known as: ANUSOL-HC Place 1 suppository (25 mg total) rectally 2 (two) times daily. What changed:   when to take this  reasons to take this   Iron (Ferrous Sulfate) 325 (65 Fe) MG Tabs Take 325 mg by mouth daily. Note that this medication will turn stool black and may cause constipation.   isosorbide mononitrate 30 MG 24 hr tablet Commonly known as: IMDUR Take 1 tablet (30 mg total) by mouth daily.   lidocaine-prilocaine cream Commonly known as: EMLA Apply 1 application topically Every Tuesday,Thursday,and Saturday with dialysis. On days of dialysis 3x week   methocarbamol 500 MG tablet Commonly known as: ROBAXIN Take 500 mg by mouth every 8 (eight) hours as needed for muscle spasms.   misoprostol 100 MCG tablet Commonly known as: CYTOTEC TAKE 1 TABLET BY MOUTH EVERY 6 HOURS What changed: when to take this   multivitamin Tabs tablet Take 1 tablet by mouth in the morning.   nitroGLYCERIN 0.4 MG SL tablet Commonly known as: NITROSTAT Place 1 tablet (0.4 mg total) under the tongue every 5 (five) minutes as needed. What changed: reasons to take this   pantoprazole 40 MG tablet Commonly known as: Protonix Take 1 tablet (40 mg total) by mouth daily. 40 mg BID for 2 months & then once daily What changed: Another  medication with the same name was removed. Continue taking this medication, and follow the directions you see here.   polyethylene glycol 17 g packet Commonly known as: MIRALAX /  GLYCOLAX Take 17 g by mouth daily as needed for moderate constipation. What changed: reasons to take this   sertraline 100 MG tablet Commonly known as: ZOLOFT Take 2 tablets (200 mg total) by mouth daily.   traZODone 50 MG tablet Commonly known as: DESYREL Take 50 mg by mouth at bedtime as needed for sleep.   UNABLE TO FIND Prosthetic socket, from [place], [place] Follow up in 1 week(s).   Specialty: Family Medicine Contact information: [address] [place] [address] [phone]        Donato Heinz, MD .   Specialties: Cardiology, Radiology Contact information: [address] [place] [address] [phone]                Time coordinating discharge: 35 min  Signed:  Geradine Girt DO  [facility] 09/11/2020, 11:48 AM

## 2020-09-11 NOTE — Procedures (Signed)
I was present at this dialysis session. I have reviewed the session itself and made appropriate changes.  Pt w/ bleeding around arterial site. Bandage improved bleeding. No other issues.  Filed Weights   09/09/20 1005 09/11/20 0658 09/11/20 1040  Weight: 58.1 kg 54 kg 51.5 kg    Recent Labs  Lab 09/11/20 0635  NA 137  K 3.5  CL 102  CO2 27  GLUCOSE 115*  BUN 30*  CREATININE 5.01*  CALCIUM 8.7*  PHOS 2.9    Recent Labs  Lab 09/08/20 1221 09/09/20 0049 09/10/20 0749 09/11/20 0635  WBC 6.5 5.2 6.4 7.3  NEUTROABS 5.3  --   --   --   HGB 4.7* 7.4* 7.5* 7.4*  HCT 15.4* 23.0* 23.4* 23.7*  MCV 93.3 89.5 91.4 92.9  PLT 234 167 180 182    Scheduled Meds: . atorvastatin  10 mg Oral QHS  . calcitRIOL  2 mcg Oral Q T,Th,Sat-1800  . calcium acetate  1,334 mg Oral Q lunch  . calcium acetate  2,001 mg Oral BID WC  . Chlorhexidine Gluconate Cloth  6 each Topical Q0600  . feeding supplement  1 Container Oral BID BM  . gabapentin  200 mg Oral 3 times per day on Sun Mon Wed Fri  . gabapentin  300 mg Oral 2 times per day on Tue Thu Sat  . hydrALAZINE  100 mg Oral 3 times per day on Sun Mon Wed Fri  . insulin aspart  0-6 Units Subcutaneous TID WC  . isosorbide mononitrate  30 mg Oral Daily  . multivitamin  1 tablet Oral Daily  . pantoprazole  40 mg Oral BID  . sertraline  200 mg Oral Daily  . sodium chloride flush  3 mL Intravenous Q12H  . traZODone  50 mg Oral QHS   Continuous Infusions: PRN Meds:.acetaminophen **OR** acetaminophen, calcium acetate, calcium carbonate (dosed in mg elemental calcium), camphor-menthol **AND** hydrOXYzine, docusate sodium, hydrALAZINE, methocarbamol, ondansetron **OR** ondansetron (ZOFRAN) IV, sorbitol, zolpidem   Santiago Bumpers,  MD 09/11/2020, 12:21 PM

## 2020-09-11 NOTE — Progress Notes (Signed)
Hemodialysis-Treatment completed without issue. Tolerated well. UF 1.5L. Per bed scale is at EDW. Report called to primary RN. Patient currently without complaints. Given breakfast snack.

## 2020-09-11 NOTE — Progress Notes (Signed)
[facility] Progress Note  Subjective: Patient seen on dialysis with no complaints.  Vitals:   09/11/20 1000 09/11/20 1030 09/11/20 1040 09/11/20 1042  BP: (!) 142/59 (!) 160/59 (!) 178/60 (!) 158/60  Pulse:      Resp: 15 16 16    Temp:   98.1 F (36.7 C)   TempSrc:   Oral   SpO2:   96%   Weight:   51.5 kg   Height:        Exam: GEN: lying in bed, nad ENT: no nasal discharge, mmm EYES: no scleral icterus, eomi CV: normal rate PULM: no iwob, bilateral chest rise ABD: NABS, non-distended SKIN: no rashes or jaundice EXT: no edema, warm and well perfused RUE AVF+bruit   Home meds:  - lipitor 10/ coreg 3.125 bid/ hydralazine 100 tid nonhd days/ imdur 30 qd/ sl ntg prn  - trazodone 50 hs/ zoloft 200mg  qd/ neurontin 300mg  bid on TTS and 200 tid non hd days  - pepcid 40 qd  - prn's/ vitamins/ supplements    CXR 3/26 - IMPRESSION: 1. New small left pleural effusion. 2. Cardiac enlargement and pulmonary vascular congestion. 3. Aortic Atherosclerosis       OP HD: [facility] TTS   4h  450/500   51.5kg  2/2 bath  P4  R AVF  Hep none   - mircera 100 q 2    Assessment/ Plan: 1. Severe anemia/GI bleed - Hb 4.7 on presentation, suspected recurrent GIB. She had extensive work up with multiple EGDs/enteroscopies and colonoscopies per GI, most recently Jan 2022.  She has been found to have very degrees of esophagitis and some gastric and small bowel AVMs that were ablated with APC. Tried capsule endo with limited exam. 2. ESRD - on HD TTS. Continue HD per schedule 3. HTN - bp's well controlled.  4. Volume - appears euvolemic 5. Depression 6. Anemia of ckd - Continue w/ high dose darbe 200ug weekly while admitted.  7. MBD ckd - calcitriol 53mcg w/ HD. On calcium acetate 3 tabs w/ meals. Will add on and restart if needed but phos normal here      [name]  09/11/2020, 10:50 AM   Recent Labs  Lab 09/10/20 0749 09/10/20 1040 09/11/20 0635  K 3.5  --   3.5  BUN 25*  --  30*  CREATININE 4.46*  --  5.01*  CALCIUM 8.4*  --  8.7*  PHOS  --  3.0 2.9  HGB 7.5*  --  7.4*   Inpatient medications: . atorvastatin  10 mg Oral QHS  . calcitRIOL  2 mcg Oral Q T,Th,Sat-1800  . calcium acetate  1,334 mg Oral Q lunch  . calcium acetate  2,001 mg Oral BID WC  . Chlorhexidine Gluconate Cloth  6 each Topical Q0600  . feeding supplement  1 Container Oral BID BM  . gabapentin  200 mg Oral 3 times per day on Sun Mon Wed Fri  . gabapentin  300 mg Oral 2 times per day on Tue Thu Sat  . hydrALAZINE  100 mg Oral 3 times per day on Sun Mon Wed Fri  . insulin aspart  0-6 Units Subcutaneous TID WC  . isosorbide mononitrate  30 mg Oral Daily  . multivitamin  1 tablet Oral Daily  . pantoprazole  40 mg Oral BID  . sertraline  200 mg Oral Daily  . sodium chloride flush  3 mL Intravenous Q12H  . traZODone  50 mg Oral QHS  acetaminophen **OR** acetaminophen, calcium acetate, calcium carbonate (dosed in mg elemental calcium), camphor-menthol **AND** hydrOXYzine, docusate sodium, hydrALAZINE, methocarbamol, ondansetron **OR** ondansetron (ZOFRAN) IV, sorbitol, zolpidem

## 2020-09-11 NOTE — Progress Notes (Signed)
  Hgb 7.4.  Stools are yellow.  She feels ok Looks comfortable but chronically ill.   Just finishing up w HD session.   Pt has fup appt w GI Dr Silverio Decamp on 5/19.  May pursue repeat, outpt VCE.   Ok to discharge home.   Case d/w Dr Ethlyn Daniels PA-C

## 2020-09-11 NOTE — Progress Notes (Signed)
Patient left for HD before shift change. Pt returned to room from HD at this time. VSS. Pericare provided. MD notified.

## 2020-09-11 NOTE — Progress Notes (Signed)
AVS reviewed with patient. All questions answered at this time. Patient voices she will takes her meds at discharge. Transportation provided by son.

## 2020-09-12 ENCOUNTER — Telehealth: Payer: Self-pay | Admitting: Nephrology

## 2020-09-12 NOTE — Telephone Encounter (Signed)
Transition of Care Contact from [place]  Date of Discharge: 09/12/20 Date of Contact: 09/11/20 Method of contact: phone - attempted  Attempted to contact patient to discuss transition of care from inpatient admission.  Patient did not answer the phone.  Message was left on patient's voicemail informing them we would attempt to call them again and if unable to reach will follow up at dialysis.  Jen Mow, PA-C [facility] Pager: [phone]

## 2020-09-13 DIAGNOSIS — Z992 Dependence on renal dialysis: Secondary | ICD-10-CM | POA: Diagnosis not present

## 2020-09-13 DIAGNOSIS — D509 Iron deficiency anemia, unspecified: Secondary | ICD-10-CM | POA: Diagnosis not present

## 2020-09-13 DIAGNOSIS — K922 Gastrointestinal hemorrhage, unspecified: Secondary | ICD-10-CM | POA: Diagnosis not present

## 2020-09-13 DIAGNOSIS — N186 End stage renal disease: Secondary | ICD-10-CM | POA: Diagnosis not present

## 2020-09-13 DIAGNOSIS — E1129 Type 2 diabetes mellitus with other diabetic kidney complication: Secondary | ICD-10-CM | POA: Diagnosis not present

## 2020-09-13 DIAGNOSIS — N2581 Secondary hyperparathyroidism of renal origin: Secondary | ICD-10-CM | POA: Diagnosis not present

## 2020-09-13 DIAGNOSIS — D631 Anemia in chronic kidney disease: Secondary | ICD-10-CM | POA: Diagnosis not present

## 2020-09-14 DIAGNOSIS — Z992 Dependence on renal dialysis: Secondary | ICD-10-CM | POA: Diagnosis not present

## 2020-09-14 DIAGNOSIS — E1129 Type 2 diabetes mellitus with other diabetic kidney complication: Secondary | ICD-10-CM | POA: Diagnosis not present

## 2020-09-14 DIAGNOSIS — N186 End stage renal disease: Secondary | ICD-10-CM | POA: Diagnosis not present

## 2020-09-15 DIAGNOSIS — D509 Iron deficiency anemia, unspecified: Secondary | ICD-10-CM | POA: Diagnosis not present

## 2020-09-15 DIAGNOSIS — N2581 Secondary hyperparathyroidism of renal origin: Secondary | ICD-10-CM | POA: Diagnosis not present

## 2020-09-15 DIAGNOSIS — D631 Anemia in chronic kidney disease: Secondary | ICD-10-CM | POA: Diagnosis not present

## 2020-09-15 DIAGNOSIS — E875 Hyperkalemia: Secondary | ICD-10-CM | POA: Diagnosis not present

## 2020-09-15 DIAGNOSIS — Z992 Dependence on renal dialysis: Secondary | ICD-10-CM | POA: Diagnosis not present

## 2020-09-15 DIAGNOSIS — N186 End stage renal disease: Secondary | ICD-10-CM | POA: Diagnosis not present

## 2020-09-18 DIAGNOSIS — D509 Iron deficiency anemia, unspecified: Secondary | ICD-10-CM | POA: Diagnosis not present

## 2020-09-18 DIAGNOSIS — Z992 Dependence on renal dialysis: Secondary | ICD-10-CM | POA: Diagnosis not present

## 2020-09-18 DIAGNOSIS — E875 Hyperkalemia: Secondary | ICD-10-CM | POA: Diagnosis not present

## 2020-09-18 DIAGNOSIS — D631 Anemia in chronic kidney disease: Secondary | ICD-10-CM | POA: Diagnosis not present

## 2020-09-18 DIAGNOSIS — N186 End stage renal disease: Secondary | ICD-10-CM | POA: Diagnosis not present

## 2020-09-18 DIAGNOSIS — N2581 Secondary hyperparathyroidism of renal origin: Secondary | ICD-10-CM | POA: Diagnosis not present

## 2020-09-20 DIAGNOSIS — N2581 Secondary hyperparathyroidism of renal origin: Secondary | ICD-10-CM | POA: Diagnosis not present

## 2020-09-20 DIAGNOSIS — N186 End stage renal disease: Secondary | ICD-10-CM | POA: Diagnosis not present

## 2020-09-20 DIAGNOSIS — D509 Iron deficiency anemia, unspecified: Secondary | ICD-10-CM | POA: Diagnosis not present

## 2020-09-20 DIAGNOSIS — Z992 Dependence on renal dialysis: Secondary | ICD-10-CM | POA: Diagnosis not present

## 2020-09-20 DIAGNOSIS — E875 Hyperkalemia: Secondary | ICD-10-CM | POA: Diagnosis not present

## 2020-09-20 DIAGNOSIS — D631 Anemia in chronic kidney disease: Secondary | ICD-10-CM | POA: Diagnosis not present

## 2020-09-21 ENCOUNTER — Telehealth: Payer: Self-pay | Admitting: Pharmacist

## 2020-09-21 NOTE — Telephone Encounter (Signed)
Called pt to follow up with home BP/HR since last visit, her son was to help her get a new battery for her BP cuff. States he has not done so and she has been unable to check BP readings at home. Readings at dialysis have varied, sometimes 118/70s, did have a low of 96 systolic after one dialysis session.  At last visit with PharmD on 3/25 - she was taking carvedilol 3.125mg  BID, Imdur 30mg  daily, and hydralazine 100mg  TID on non-HD days and BP was stable. Goal was to increase carvedilol dose due to her CHF, but her HR was in the low 50s. She went to the hospital for GI bleed on 3/26-3/29 and was transfused, per discharge summary "previously on Coreg but this does not appear to have been continued from last hospitalization - needs reconsideration once more stable," but it was that same discharge summary that stopped her carvedilol on 3/29.  Pt reports feeling dizzy frequently in the AM, continues throughout the day. Asked pt if I called her in 1 month if she thinks she can monitor/record home BP readings and she stated yes. Will call pt in 1 month, goal to resume carvedilol or low dose Toprol for benefit with her CHF if BP/HR allows, since we are unable to start other GDMT (ACE/ARB, aldosterone antagonist, and SGLT2i) due to her dialysis.

## 2020-09-22 ENCOUNTER — Encounter (HOSPITAL_COMMUNITY): Payer: Self-pay

## 2020-09-22 ENCOUNTER — Emergency Department (HOSPITAL_COMMUNITY): Payer: Medicare Other

## 2020-09-22 ENCOUNTER — Other Ambulatory Visit: Payer: Self-pay

## 2020-09-22 ENCOUNTER — Emergency Department (HOSPITAL_COMMUNITY)
Admission: EM | Admit: 2020-09-22 | Discharge: 2020-09-23 | Disposition: A | Discharge status: Home / Self Care | Payer: Medicare Other | Attending: Emergency Medicine | Admitting: Emergency Medicine

## 2020-09-22 DIAGNOSIS — Z96651 Presence of right artificial knee joint: Secondary | ICD-10-CM | POA: Diagnosis not present

## 2020-09-22 DIAGNOSIS — F17210 Nicotine dependence, cigarettes, uncomplicated: Secondary | ICD-10-CM | POA: Diagnosis not present

## 2020-09-22 DIAGNOSIS — Z8616 Personal history of COVID-19: Secondary | ICD-10-CM | POA: Insufficient documentation

## 2020-09-22 DIAGNOSIS — R519 Headache, unspecified: Secondary | ICD-10-CM | POA: Diagnosis not present

## 2020-09-22 DIAGNOSIS — T887XXA Unspecified adverse effect of drug or medicament, initial encounter: Secondary | ICD-10-CM

## 2020-09-22 DIAGNOSIS — G319 Degenerative disease of nervous system, unspecified: Secondary | ICD-10-CM | POA: Diagnosis not present

## 2020-09-22 DIAGNOSIS — Z992 Dependence on renal dialysis: Secondary | ICD-10-CM | POA: Insufficient documentation

## 2020-09-22 DIAGNOSIS — Z79899 Other long term (current) drug therapy: Secondary | ICD-10-CM | POA: Insufficient documentation

## 2020-09-22 DIAGNOSIS — R531 Weakness: Secondary | ICD-10-CM

## 2020-09-22 DIAGNOSIS — R29818 Other symptoms and signs involving the nervous system: Secondary | ICD-10-CM | POA: Diagnosis not present

## 2020-09-22 DIAGNOSIS — K7689 Other specified diseases of liver: Secondary | ICD-10-CM | POA: Diagnosis not present

## 2020-09-22 DIAGNOSIS — J810 Acute pulmonary edema: Secondary | ICD-10-CM | POA: Diagnosis not present

## 2020-09-22 DIAGNOSIS — D259 Leiomyoma of uterus, unspecified: Secondary | ICD-10-CM | POA: Diagnosis not present

## 2020-09-22 DIAGNOSIS — I6782 Cerebral ischemia: Secondary | ICD-10-CM | POA: Diagnosis not present

## 2020-09-22 DIAGNOSIS — T50995A Adverse effect of other drugs, medicaments and biological substances, initial encounter: Secondary | ICD-10-CM | POA: Diagnosis not present

## 2020-09-22 DIAGNOSIS — R109 Unspecified abdominal pain: Secondary | ICD-10-CM

## 2020-09-22 DIAGNOSIS — I517 Cardiomegaly: Secondary | ICD-10-CM | POA: Diagnosis not present

## 2020-09-22 DIAGNOSIS — Z96652 Presence of left artificial knee joint: Secondary | ICD-10-CM | POA: Diagnosis not present

## 2020-09-22 DIAGNOSIS — E1122 Type 2 diabetes mellitus with diabetic chronic kidney disease: Secondary | ICD-10-CM | POA: Insufficient documentation

## 2020-09-22 DIAGNOSIS — I25118 Atherosclerotic heart disease of native coronary artery with other forms of angina pectoris: Secondary | ICD-10-CM | POA: Diagnosis not present

## 2020-09-22 DIAGNOSIS — I132 Hypertensive heart and chronic kidney disease with heart failure and with stage 5 chronic kidney disease, or end stage renal disease: Secondary | ICD-10-CM | POA: Diagnosis not present

## 2020-09-22 DIAGNOSIS — K5730 Diverticulosis of large intestine without perforation or abscess without bleeding: Secondary | ICD-10-CM | POA: Diagnosis not present

## 2020-09-22 DIAGNOSIS — N186 End stage renal disease: Secondary | ICD-10-CM | POA: Insufficient documentation

## 2020-09-22 DIAGNOSIS — J90 Pleural effusion, not elsewhere classified: Secondary | ICD-10-CM | POA: Diagnosis not present

## 2020-09-22 DIAGNOSIS — K219 Gastro-esophageal reflux disease without esophagitis: Secondary | ICD-10-CM | POA: Diagnosis not present

## 2020-09-22 DIAGNOSIS — E278 Other specified disorders of adrenal gland: Secondary | ICD-10-CM | POA: Diagnosis not present

## 2020-09-22 DIAGNOSIS — J811 Chronic pulmonary edema: Secondary | ICD-10-CM | POA: Diagnosis not present

## 2020-09-22 DIAGNOSIS — I5042 Chronic combined systolic (congestive) and diastolic (congestive) heart failure: Secondary | ICD-10-CM | POA: Diagnosis not present

## 2020-09-22 DIAGNOSIS — G9389 Other specified disorders of brain: Secondary | ICD-10-CM | POA: Diagnosis not present

## 2020-09-22 LAB — COMPREHENSIVE METABOLIC PANEL
ALT: 22 U/L (ref 0–44)
AST: 16 U/L (ref 15–41)
Albumin: 2.3 g/dL — ABNORMAL LOW (ref 3.5–5.0)
Alkaline Phosphatase: 72 U/L (ref 38–126)
Anion gap: 11 (ref 5–15)
BUN: 12 mg/dL (ref 8–23)
CO2: 29 mmol/L (ref 22–32)
Calcium: 9.6 mg/dL (ref 8.9–10.3)
Chloride: 96 mmol/L — ABNORMAL LOW (ref 98–111)
Creatinine, Ser: 4.74 mg/dL — ABNORMAL HIGH (ref 0.44–1.00)
GFR, Estimated: 10 mL/min — ABNORMAL LOW (ref >60)
Glucose, Bld: 138 mg/dL — ABNORMAL HIGH (ref 70–99)
Potassium: 3.7 mmol/L (ref 3.5–5.1)
Sodium: 136 mmol/L (ref 135–145)
Total Bilirubin: 0.8 mg/dL (ref 0.3–1.2)
Total Protein: 5.5 g/dL — ABNORMAL LOW (ref 6.5–8.1)

## 2020-09-22 LAB — CBC
HCT: 29.1 % — ABNORMAL LOW (ref 36.0–46.0)
Hemoglobin: 8.8 g/dL — ABNORMAL LOW (ref 12.0–15.0)
MCH: 30.6 pg (ref 26.0–34.0)
MCHC: 30.2 g/dL (ref 30.0–36.0)
MCV: 101 fL — ABNORMAL HIGH (ref 80.0–100.0)
Platelets: 212 10*3/uL (ref 150–400)
RBC: 2.88 MIL/uL — ABNORMAL LOW (ref 3.87–5.11)
RDW: 19.6 % — ABNORMAL HIGH (ref 11.5–15.5)
WBC: 6.3 10*3/uL (ref 4.0–10.5)
nRBC: 0 % (ref 0.0–0.2)

## 2020-09-22 LAB — LIPASE, BLOOD: Lipase: 25 U/L (ref 11–51)

## 2020-09-22 MED ORDER — ONDANSETRON 4 MG PO TBDP
8.0000 mg | ORAL_TABLET | Freq: Once | ORAL | Status: AC
Start: 2020-09-22 — End: 2020-09-22
  Administered 2020-09-22: 8 mg via ORAL
  Filled 2020-09-22: qty 2

## 2020-09-22 NOTE — ED Provider Notes (Signed)
[facility] EMERGENCY DEPARTMENT Provider Note   CSN: [id number] Arrival date & time: 09/22/20  1912     History Chief Complaint  Patient presents with  . Abdominal Pain    Brandy Houston is a 65 y.o. female.  HPI She presents for evaluation of multiple problems.  Today she was having difficulty standing and walking despite trying to use her prosthesis.  Her son states that she is also having periods of time when her arms drop to her sides, and they stop moving.  Patient reports to me that sometimes she cannot stop moving.  Her son is concerned that she is confused today.  He brought her here for evaluation.  Patient reports that she has a headache and abdominal pain today.  She took her usual medicines, but did not go to dialysis today.  Yesterday she is doing pretty well and able to do chores around the house.  She has had some trouble walking, on and off for several days.  Not recently seen her PCP.  No known sick contacts.  There are no other known modifying factors.    Past Medical History:  Diagnosis Date  . Allergy   . Anemia   . Anxiety   . Arthritis    "in my joints" (03/10/2017)  . Chronic diastolic CHF (congestive heart failure) ([place])   . Depression    Chronic  . Diabetic peripheral neuropathy ([place])   . Dysrhythmia    tachycardia, normal ECHO 08-09-14  . ESRD (end stage renal disease) ([place])   . GERD (gastroesophageal reflux disease)   . Heart murmur     dx'd 02/05/2017  . Hepatitis C    "tx'd in 2016; I'm negative now" (02/05/2017)  . History of blood transfusion 2017   "w/knee replacement"  . Hyperlipidemia   . Hypertension   . Peripheral neuropathy   . Protein calorie malnutrition ([place])   . Septic arthritis ([place])   . Slow transit constipation   . Symptomatic anemia 01/27/2020  . Type II diabetes mellitus ([name])    IDDM  . Uncontrolled hypertension 02/18/2013    Patient Active Problem List   Diagnosis Date Noted  . Acute upper GI bleeding  09/08/2020  . Dyslipidemia 09/08/2020  . Esophageal candidiasis ([place]) 06/25/2020  . Intestinal angiodysplasia   . GIB (gastrointestinal bleeding) 06/21/2020  . Lab test positive for detection of COVID-19 virus 06/21/2020  . Rectal bleeding 06/20/2020  . Coronary artery disease of native artery of native heart with stable angina pectoris ([place]) 06/13/2020  . Nonrheumatic aortic valve insufficiency 06/13/2020  . Nonrheumatic tricuspid valve regurgitation 06/13/2020  . GAVE (gastric antral vascular ectasia)   . AVM (arteriovenous malformation) of small bowel, acquired with hemorrhage   . Hip swelling, right 05/27/2020  . Hypokalemia 05/27/2020  . Anemia of chronic renal failure 05/27/2020  . 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 Acute combined systolic and diastolic heart failure ([place])   . Unstable angina ([place])   . Symptomatic anemia 01/27/2020  . Hypercalcemia 10/06/2019  . Moderate protein-calorie malnutrition ([place]) 05/11/2018  . End stage renal disease ([place]) 04/19/2018  . Iron deficiency anemia, unspecified 04/19/2018  . Nephrotic syndrome with unspecified morphologic changes 04/19/2018  . Secondary hyperparathyroidism of renal origin ([place]) 04/19/2018  . Nicotine dependence, cigarettes, uncomplicated 14/43/1540  . Essential hypertension   . Amputation above knee ([place])   . ABLA (acute blood loss anemia)   . DM type 2 causing ESRD ([place])  03/21/2017  . Anemia in chronic kidney disease 02/05/2017  . Chronic diastolic CHF (congestive heart failure) ([place]) 11/08/2015  . Diabetic peripheral neuropathy ([place])   . Major depression, recurrent, chronic ([place])   . Chronic kidney disease (CKD), stage IV (severe) ([place])   . Hepatitis C 08/16/2014  . Peripheral neuropathy 06/24/2013    Past Surgical History:  Procedure Laterality Date  . A/V FISTULAGRAM Right 07/07/2018   Procedure: A/V FISTULAGRAM;  Surgeon: Marty Heck, MD;  Location: [facility];   Service: Cardiovascular;  Laterality: Right;  . AMPUTATION Left 03/20/2017   Procedure: LEFT ABOVE KNEE AMPUTATION;  Surgeon: Newt Minion, MD;  Location: [place];  Service: Orthopedics;  Laterality: Left;  . APPENDECTOMY    . AV FISTULA PLACEMENT Left 09/13/2014   Procedure: Brachial Artery to Brachial Vein Gortex Four - Seven Stretch GRAFT INSERTION Left Forearm;  Surgeon: Mal Misty, MD;  Location: [place] [facility];  Service: Vascular;  Laterality: Left;  . BASCILIC VEIN TRANSPOSITION Right 10/26/2017   Procedure: BASILIC VEIN TRANSPOSITION FIRST STAGE RIGHT ARM;  Surgeon: Conrad [place]ama, MD;  Location: [facility];  Service: Vascular;  Laterality: Right;  . BASCILIC VEIN TRANSPOSITION Right 02/01/2018   Procedure: SECOND STAGE BASILIC VEIN TRANSPOSITION RIGHT UPPER EXTREMITY;  Surgeon: Marty Heck, MD;  Location: [place];  Service: Vascular;  Laterality: Right;  . CHOLECYSTECTOMY OPEN    . COLON SURGERY    . COLONOSCOPY    . ENTEROSCOPY N/A 05/28/2020   Procedure: ENTEROSCOPY;  Surgeon: Mauri Pole, MD;  Location: [facility] ENDOSCOPY;  Service: Endoscopy;  Laterality: N/A;  . ENTEROSCOPY N/A 06/25/2020   Procedure: ENTEROSCOPY;  Surgeon: Milus Banister, MD;  Location: [facility] ENDOSCOPY;  Service: Gastroenterology;  Laterality: N/A;  . ESOPHAGOGASTRODUODENOSCOPY (EGD) WITH PROPOFOL N/A 05/14/2020   Procedure: ESOPHAGOGASTRODUODENOSCOPY (EGD) WITH PROPOFOL;  Surgeon: Mauri Pole, MD;  Location: [facility] ENDOSCOPY;  Service: Endoscopy;  Laterality: N/A;  with APC  . EXCISIONAL TOTAL KNEE ARTHROPLASTY WITH ANTIBIOTIC SPACERS Left 02/06/2017   Procedure: Incisional total iknee with antibiotic spacer ;  Surgeon: Leandrew Koyanagi, MD;  Location: [facility];  Service: Orthopedics;  Laterality: Left;  . GIVENS CAPSULE STUDY N/A 09/09/2020   Procedure: GIVENS CAPSULE STUDY;  Surgeon: Jackquline Denmark, MD;  Location: [facility] ENDOSCOPY;  Service: Endoscopy;  Laterality: N/A;  . HOT HEMOSTASIS N/A 05/14/2020   Procedure: HOT  HEMOSTASIS (ARGON PLASMA COAGULATION/BICAP);  Surgeon: Mauri Pole, MD;  Location: [facility];  Service: Endoscopy;  Laterality: N/A;  . HOT HEMOSTASIS N/A 05/28/2020   Procedure: HOT HEMOSTASIS (ARGON PLASMA COAGULATION/BICAP);  Surgeon: Mauri Pole, MD;  Location: [facility] ENDOSCOPY;  Service: Endoscopy;  Laterality: N/A;  . HOT HEMOSTASIS N/A 06/25/2020   Procedure: HOT HEMOSTASIS (ARGON PLASMA COAGULATION/BICAP);  Surgeon: Milus Banister, MD;  Location: [facility] ENDOSCOPY;  Service: Gastroenterology;  Laterality: N/A;  . INTRAVASCULAR PRESSURE WIRE/FFR STUDY N/A 04/13/2020   Procedure: INTRAVASCULAR PRESSURE WIRE/FFR STUDY;  Surgeon: Burnell Blanks, MD;  Location: [facility];  Service: Cardiovascular;  Laterality: N/A;  . IR FLUORO GUIDE CV LINE RIGHT  02/10/2017  . IR REMOVAL TUN CV CATH W/O FL  04/01/2017  . IR US GUIDE VASC ACCESS RIGHT  02/10/2017  . IRRIGATION AND DEBRIDEMENT KNEE Left 03/12/2017   Procedure: IRRIGATION AND DEBRIDEMENT LEFT KNEE WITH WOUND VAC APPLICATION;  Surgeon: Leandrew Koyanagi, MD;  Location: [place];  Service: Orthopedics;  Laterality: Left;  . JOINT REPLACEMENT    .  KNEE ARTHROSCOPY Right 08/10/2014   Procedure: ARTHROSCOPY I & D KNEE;  Surgeon: Marianna Payment, MD;  Location: [facility];  Service: Orthopedics;  Laterality: Right;  . KNEE ARTHROSCOPY Left 08/11/2014   Procedure: ARTHROSCOPIC WASHOUT LEFT KNEE;  Surgeon: Marianna Payment, MD;  Location: [facility];  Service: Orthopedics;  Laterality: Left;  . KNEE ARTHROSCOPY Left 08/19/2014   Procedure: ARTHROSCOPIC WASHOUT LEFT KNEE;  Surgeon: Leandrew Koyanagi, MD;  Location: [place];  Service: Orthopedics;  Laterality: Left;  . KNEE ARTHROSCOPY WITH LATERAL MENISECTOMY Left 04/04/2015   Procedure: AND PARTIAL LATERAL MENISECTOMY;  Surgeon: Leandrew Koyanagi, MD;  Location: [facility];  Service: Orthopedics;  Laterality: Left;  . KNEE ARTHROSCOPY WITH MEDIAL MENISECTOMY Left 04/04/2015   Procedure:  LEFT KNEE ARTHROSCOPY WITH PARTIAL MEDIAL MENISCECTOMY  AND SYNOVECTOMY;  Surgeon: Leandrew Koyanagi, MD;  Location: [place];  Service: Orthopedics;  Laterality: Left;  . PERIPHERAL VASCULAR BALLOON ANGIOPLASTY  07/07/2018   Procedure: PERIPHERAL VASCULAR BALLOON ANGIOPLASTY;  Surgeon: Marty Heck, MD;  Location: [facility] CV LAB;  Service: Cardiovascular;;  right AV fistula  . POLYPECTOMY    . RIGHT/LEFT HEART CATH AND CORONARY ANGIOGRAPHY N/A 04/13/2020   Procedure: RIGHT/LEFT HEART CATH AND CORONARY ANGIOGRAPHY;  Surgeon: Burn[place] Blanks, MD;  Location: [facility];  Service: Cardiovascular;  Laterality: N/A;  . SHOULDER ARTHROSCOPY Bilateral 08/10/2014   Procedure: I & D BILATERAL SHOULDERS ;  Surgeon: Marianna Payment, MD;  [facility]: [facility];  Service: Orthopedics;  Laterality: Bilateral;  . SMALL INTESTINE SURGERY     Due to Small Bowel Obstruction; "fixed it when they did my gallbladder OR"  . TEE WITHOUT CARDIOVERSION N/A 08/14/2014   Procedure: TRANSESOPHAGEAL ECHOCARDIOGRAM (TEE);  Surgeon: Thayer Headings, MD;  Location: [facility];  Service: Cardiovascular;  Laterality: N/A;  . TENOSYNOVECTOMY Right 08/11/2014   Procedure: RIGHT WRIST IRRIGATION AND DEBRIDEMENT, TENOSYNOVECTOMY;  Surgeon: Marianna Payment, MD;  Location: [place];  Service: Orthopedics;  Laterality: Right;  . TOTAL KNEE ARTHROPLASTY Left 11/07/2015   Procedure: LEFT TOTAL KNEE ARTHROPLASTY WITH REVISION OF IMPLANTS;  Surgeon: Leandrew Koyanagi, MD;  Location: [place];  Service: Orthopedics;  Laterality: Left;  . TUBAL LIGATION       OB History    Gravida  5   Para  0   Term  0   Preterm      AB  2   Living  3     SAB  1   IAB  1   Ectopic      Multiple      Live Births              Family History  Problem Relation Age of Onset  . Uterine cancer Mother   . Heart disease Father   . Hypertension Sister   . Uterine cancer Sister   . Cancer Brother         unsure type  . Diabetes Brother   . Hypertension Brother   . Colon cancer Neg Hx   . Colon polyps Neg Hx   . Esophageal cancer Neg Hx   . Rectal cancer Neg Hx   . Stomach cancer Neg Hx     Social History   Tobacco Use  . Smoking status: Current Every Day Smoker    Packs/day: 0.50    Years: 45.00    Pack years: 22.50    Types: Cigarettes  . Smokeless tobacco:  Never Used  Vaping Use  . Vaping Use: Never used  Substance Use Topics  . Alcohol use: Not Currently    Alcohol/week: 0.0 standard drinks    Comment: 03/10/2017 "I drink a wine cooler a few times/year"  . Drug use: No    Home Medications Prior to Admission medications   Medication Sig Start Date End Date Taking? Authorizing Provider  Accu-Chek Softclix Lancets lancets Check sugars three times a day for E11.65 09/20/18   [name], MD  acetaminophen (TYLENOL) 500 MG tablet Take 1,000 mg by mouth every 6 (six) hours as needed (for pain).    [provider]  ascorbic acid (VITAMIN C) 500 MG tablet Take 1 tablet (500 mg total) by mouth daily. 06/25/20   Samuella Cota, MD  atorvastatin (LIPITOR) 10 MG tablet Take 1 tablet (10 mg total) by mouth at bedtime. 06/13/20   [name], MD  blood glucose meter kit and supplies KIT Dispense based on patient and insurance preference. Use up to four times daily as directed 10/13/19   [name], MD  Blood Glucose Monitoring Suppl (ACCU-CHEK GUIDE) w/Device KIT 1 Device by Does not apply route daily. 10/31/19   [name], MD  calcium acetate (PHOSLO) 667 MG capsule Take 1,334-2,001 mg by mouth See admin instructions. Take 2,001 mg by mouth in the morning with breakfast, 1,334 mg with lunch (IF consumed), and 2,001 mg with dinner/evening meal 01/13/19   [provider]  diltiazem 2 % GEL Apply 1 application topically 3 (three) times daily. Apply to affected area on anus Patient taking differently: Apply 1 application topically 3 (three) times daily as  needed (for irritation on/in the anus). 06/25/20   Samuella Cota, MD  diphenhydrAMINE (BENADRYL) 25 mg capsule Take 25 mg by mouth every 6 (six) hours as needed for allergies.    [provider]  famotidine (PEPCID) 40 MG tablet Take 1 tablet (40 mg total) by mouth daily. 08/24/20   Noralyn Pick, NP  gabapentin (NEURONTIN) 100 MG capsule Take 200 mg by mouth See admin instructions. Take 2 capsules (200 mg) by mouth 3 times on Sundays, Mondays, Wednesdays, & Fridays 11/01/18   [provider]  gabapentin (NEURONTIN) 300 MG capsule Take 300 mg by mouth See admin instructions. Take 300 mg by mouth with lunch on Tuesdays, Thursdays, & Saturdays    [provider]  glucose blood (ACCU-CHEK GUIDE) test strip Check blood sugar up to 4 times a day 10/20/19   [name], MD  hydrALAZINE (APRESOLINE) 100 MG tablet Take 100 mg by mouth See admin instructions. Take 100 mg by mouth three times a day on Sun/Mon/Wed/Fri (NON-DIALYSIS DAYS)    [provider]  hydrocortisone (ANUSOL-HC) 25 MG suppository Place 1 suppository (25 mg total) rectally 2 (two) times daily. Patient taking differently: Place 25 mg rectally 2 (two) times daily as needed for hemorrhoids or anal itching. 06/25/20   Samuella Cota, MD  Iron, Ferrous Sulfate, 325 (65 Fe) MG TABS Take 325 mg by mouth daily. Note that this medication will turn stool black and may cause constipation. 06/25/20   Samuella Cota, MD  isosorbide mononitrate (IMDUR) 30 MG 24 hr tablet Take 1 tablet (30 mg total) by mouth daily. 07/16/20 10/14/20  Donato Heinz, MD  Lancet Devices [facility]) lancets Check sugars TID for E11.65 01/29/15   Lance Bosch, NP  lidocaine-prilocaine (EMLA) cream Apply 1 application topically Every Tuesday,Thursday,and Saturday  with dialysis. On days of dialysis 3x week 10/25/18   [provider]  methocarbamol (ROBAXIN) 500 MG tablet Take 500 mg by mouth every 8  (eight) hours as needed for muscle spasms.  Patient not taking: Reported on 09/08/2020 06/08/19   [provider]  misoprostol (CYTOTEC) 100 MCG tablet TAKE 1 TABLET BY MOUTH EVERY 6 HOURS Patient taking differently: Take 100 mcg by mouth every 6 (six) hours. 08/31/20   Fayrene Helper, MD  multivitamin (RENA-VIT) TABS tablet Take 1 tablet by mouth in the morning. 03/01/19   [provider]  nitroGLYCERIN (NITROSTAT) 0.4 MG SL tablet Place 1 tablet (0.4 mg total) under the tongue every 5 (five) minutes as needed. Patient taking differently: Place 0.4 mg under the tongue every 5 (five) minutes as needed for chest pain. 04/25/20 07/24/20  Donato Heinz, MD  pantoprazole (PROTONIX) 40 MG tablet Take 1 tablet (40 mg total) by mouth daily. 40 mg BID for 2 months & then once daily Patient not taking: No sig reported 08/03/20 10/02/20  [name], MD  polyethylene glycol (MIRALAX / GLYCOLAX) 17 g packet Take 17 g by mouth daily as needed for moderate constipation. Patient taking differently: Take 17 g by mouth daily as needed for moderate constipation (MIX AND DRINK). 12/10/18   [name], MD  sertraline (ZOLOFT) 100 MG tablet Take 2 tablets (200 mg total) by mouth daily. 06/13/20   [name], MD  traZODone (DESYREL) 50 MG tablet Take 50 mg by mouth at bedtime as needed for sleep. 10/27/18   [provider]  UNABLE TO FIND Prosthetic socket, from [facility] 08/03/20   [name], MD    Allergies    Compazine [prochlorperazine], Shellfish-derived products, Iodinated diagnostic agents, Omnipaque [iohexol], Tape, and Sulfa antibiotics  Review of Systems   Review of Systems  All other systems reviewed and are negative.   Physical Exam Updated Vital Signs BP 133/72 (BP Location: Left Arm)   Pulse 90   Temp 98.7 F (37.1 C) (Oral)   Resp 16   Ht _0  (1.702 m)   Wt 51.5 kg   LMP 03/16/2002   SpO2 100%   BMI 17.78 kg/m   Physical  Exam Vitals and nursing note reviewed.  Constitutional:      General: She is not in acute distress.    Appearance: She is well-developed. She is not ill-appearing.     Comments: Frail.  HENT:     Head: Normocephalic and atraumatic.     Right Ear: External ear normal.     Left Ear: External ear normal.  Eyes:     Conjunctiva/sclera: Conjunctivae normal.     Pupils: Pupils are equal, round, and reactive to light.  Neck:     Trachea: Phonation normal.  Cardiovascular:     Rate and Rhythm: Normal rate and regular rhythm.     Heart sounds: Normal heart sounds.  Pulmonary:     Effort: Pulmonary effort is normal.     Breath sounds: Normal breath sounds.  Abdominal:     General: There is no distension.     Palpations: Abdomen is soft.     Tenderness: There is no abdominal tenderness.  Musculoskeletal:        General: Normal range of motion.     Cervical back: Normal range of motion and neck supple.  Skin:    General: Skin is warm and dry.  Neurological:     Mental Status: She  is alert and oriented to person, place, and time.     Cranial Nerves: No cranial nerve deficit.     Sensory: No sensory deficit.     Motor: No abnormal muscle tone.     Coordination: Coordination normal.  Psychiatric:        Mood and Affect: Mood normal.        Behavior: Behavior normal.        Thought Content: Thought content normal.        Judgment: Judgment normal.     ED Results / Procedures / Treatments   Labs (all labs ordered are listed, but only abnormal results are displayed) Labs Reviewed  CBC - Abnormal; Notable for the following components:      Result Value   RBC 2.88 (*)    Hemoglobin 8.8 (*)    HCT 29.1 (*)    MCV 101.0 (*)    RDW 19.6 (*)    All other components within normal limits  LIPASE, BLOOD  COMPREHENSIVE METABOLIC PANEL    EKG None  Radiology DG Chest Port 1 View  Result Date: 09/22/2020 CLINICAL DATA:  [place] EXAM: PORTABLE CHEST 1 VIEW COMPARISON:  09/08/2020,  CT 08/07/2014 FINDINGS: Cardiomegaly. Small bilateral pleural effusions. Mild diffuse interstitial and ground-glass opacity, probable mild pulmonary edema. Aortic atherosclerosis. No pneumothorax. IMPRESSION: Cardiomegaly with small bilateral pleural effusions and probable mild pulmonary edema. Electronically Signed   By: Donavan Foil M.D.   On: 09/22/2020 19:49    Procedures Procedures   Medications Ordered in ED Medications  ondansetron (ZOFRAN-ODT) disintegrating tablet 8 mg (has no administration in time range)    ED Course  I have reviewed the triage vital signs and the nursing notes.  Pertinent labs & imaging results that were available during my care of the patient were reviewed by me and considered in my medical decision making (see chart for details).    MDM Rules/Calculators/A&P                           Patient Vitals for the past 24 hrs:  BP Temp Temp src Pulse Resp SpO2 Height Weight  09/22/20 1922 -- -- -- -- -- -- _0  (1.702 m) 51.5 kg  09/22/20 1918 133/72 98.7 F (37.1 C) Oral 90 16 100 % -- --     Medical Decision Making:  This patient is presenting for evaluation of nonspecific symptoms including difficulty walking, movement problems of arms and legs, abdominal pain, and decreased appetite, which does require a range of treatment options, and is a complaint that involves a moderate risk of morbidity and mortality. The differential diagnoses include enteritis, metabolic disorder, CVA. I decided to review old records, and in summary patient with end-stage renal disease presenting with nonspecific symptoms.  I obtained additional historical information from son at bedside.  Clinical Laboratory Tests Ordered, included CBC, Metabolic panel and Lipase. Radiologic Tests Ordered, included chest x-ray, CT scan head, CT abdomen pelvis.  I independently Visualized: Radiographic images, which show mild fluid overload with pulmonary edema, and bilateral small  effusions.   Critical Interventions-clinical evaluation, laboratory testing, radiography, observation reassess  After These Interventions, the Patient was reevaluated and was found with nonspecific symptoms.  She missed dialysis today and has mild fluid overload on chest x-ray, but no respiratory distress.  Patient with likely weakness contributing to difficulty standing and walking.  This is been intermittent over several days time.  No clear etiology clinically, patient  evaluated in the ED for occult CVA and intra-abdominal processes.  She does not appear to require acute dialysis at this time.  She can potentially be discharged, if she is able to tolerate oral food and nutrition.  CRITICAL CARE-no Performed by: Daleen Bo  Nursing Notes Reviewed/ Care Coordinated Applicable Imaging Reviewed Interpretation of Laboratory Data incorporated into ED treatment  Nursing Notes Reviewed/ Care Coordinated Applicable Imaging Reviewed Interpretation of Laboratory Data incorporated into ED treatment  Plan disposition by oncoming team following return of imaging.   Final Clinical Impression(s) / ED Diagnoses Final diagnoses:  Weakness  Abdominal pain, unspecified abdominal location    Rx / DC Orders ED Discharge Orders    None       Daleen Bo, MD 09/22/20 2027

## 2020-09-22 NOTE — ED Provider Notes (Signed)
Care assumed at shift change from Dr. Eulis Foster, pending imaging and re-evaluation, PO challenge. See their note for full HPI and workup. Briefly, pt presenting with headache and abdominal pain, missed dialysis today.  Labs are reassuring, no indication for emergent dialysis today.  Reported to be diffusely tender throughout her abdomen.  Afebrile with stable vital signs.  Pending CT AP, CT head.  If negative plan to p.o. challenge.  If tolerating p.o., anticipate discharge to home.  Physical Exam  BP 130/68   Pulse 79   Temp 98.7 F (37.1 C) (Oral)   Resp 15   Ht 5\' 7"  (1.702 m)   Wt 51.5 kg   LMP 03/16/2002   SpO2 93%   BMI 17.78 kg/m   Physical Exam Vitals and nursing note reviewed.  Constitutional:      Appearance: She is well-developed.     Comments: Chronically ill-appearing.  Drowsy though alert to verbal stimuli.   oriented  HENT:     Head: Normocephalic and atraumatic.  Eyes:     Conjunctiva/sclera: Conjunctivae normal.  Cardiovascular:     Rate and Rhythm: Normal rate and regular rhythm.  Pulmonary:     Effort: Pulmonary effort is normal.  Abdominal:     General: Abdomen is flat. Bowel sounds are normal.     Palpations: Abdomen is soft.     Tenderness: There is generalized abdominal tenderness. There is no guarding or rebound.  Skin:    General: Skin is warm.  Neurological:     Comments: Left lower lip is drooping to the left though there is no flattening of the nasolabial fold and the smile is symmetric.  Patient is edentulous, speech is slightly slurred. Abnormal coordination with finger-nose bilaterally.  Equal grip strength to bilateral upper extremities though does have abnormal movements, for example patient will hold her arms up and then drop her arm suddenly and then raise them up once more.  She is constantly tapping her left thigh, and tapping her right foot (she states this is normal for her to do.) Reports equal sensation to light touch to bilateral upper  extremities.  Psychiatric:        Behavior: Behavior normal.    Results for orders placed or performed during the hospital encounter of 09/22/20  Lipase, blood  Result Value Ref Range   Lipase 25 11 - 51 U/L  Comprehensive metabolic panel  Result Value Ref Range   Sodium 136 135 - 145 mmol/L   Potassium 3.7 3.5 - 5.1 mmol/L   Chloride 96 (L) 98 - 111 mmol/L   CO2 29 22 - 32 mmol/L   Glucose, Bld 138 (H) 70 - 99 mg/dL   BUN 12 8 - 23 mg/dL   Creatinine, Ser 4.74 (H) 0.44 - 1.00 mg/dL   Calcium 9.6 8.9 - 10.3 mg/dL   Total Protein 5.5 (L) 6.5 - 8.1 g/dL   Albumin 2.3 (L) 3.5 - 5.0 g/dL   AST 16 15 - 41 U/L   ALT 22 0 - 44 U/L   Alkaline Phosphatase 72 38 - 126 U/L   Total Bilirubin 0.8 0.3 - 1.2 mg/dL   GFR, Estimated 10 (L) >60 mL/min   Anion gap 11 5 - 15  CBC  Result Value Ref Range   WBC 6.3 4.0 - 10.5 K/uL   RBC 2.88 (L) 3.87 - 5.11 MIL/uL   Hemoglobin 8.8 (L) 12.0 - 15.0 g/dL   HCT 29.1 (L) 36.0 - 46.0 %   MCV 101.0 (H)  80.0 - 100.0 fL   MCH 30.6 26.0 - 34.0 pg   MCHC 30.2 30.0 - 36.0 g/dL   RDW 19.6 (H) 11.5 - 15.5 %   Platelets 212 150 - 400 K/uL   nRBC 0.0 0.0 - 0.2 %   CT Abdomen Pelvis Wo Contrast  Result Date: 09/22/2020 CLINICAL DATA:  Abdominal pain. Clinical concern for abscess or infection. EXAM: CT ABDOMEN AND PELVIS WITHOUT CONTRAST TECHNIQUE: Multidetector CT imaging of the abdomen and pelvis was performed following the standard protocol without IV contrast. COMPARISON:  01/27/2020 FINDINGS: Lower chest: Enlarged heart. Diffuse low density of the blood. Small pericardial effusion, decreased in size. Small to moderate-sized right pleural effusion and small left pleural effusion. Patchy interstitial prominence and some atelectasis at both posterior lung bases. Hepatobiliary: Somewhat nodular liver contours with a small right lobe and enlarged lateral segment left lobe and caudate lobe. Cholecystectomy clips. Pancreas: Unremarkable. No pancreatic ductal  dilatation or surrounding inflammatory changes. Spleen: Normal in size without focal abnormality. Adrenals/Urinary Tract: Stable diffusely thickened left adrenal gland and normal appearing right adrenal gland. Unremarkable kidneys, ureters and urinary bladder. Stomach/Bowel: Multiple sigmoid colon diverticula without evidence of diverticulitis. Unremarkable stomach and small bowel. Metallic artifacts in the pelvis obscuring the expected region of the appendix, surgically absent by history. Vascular/Lymphatic: Atheromatous arterial calcifications without aneurysm. No enlarged lymph nodes. Reproductive: Partially calcified uterine fibroids. No visible adnexal mass. Other: Moderate amount of free peritoneal fluid. Diffuse subcutaneous edema. Musculoskeletal: Stable old lumbar vertebral compression deformities. No acute fractures or subluxations. L5-S1 degenerative changes. IMPRESSION: 1. No acute abnormality. 2. Moderate amount of free peritoneal fluid. 3. Diffuse subcutaneous edema. 4. Small to moderate-sized right pleural effusion and small left pleural effusion. 5. Patchy interstitial prominence and some atelectasis at both posterior lung bases, compatible with interstitial pulmonary edema. 6. Cardiomegaly. 7. Diffuse low density of the blood, compatible with anemia. 8. Sigmoid diverticulosis. 9. Changes compatible with cirrhosis of the liver. Electronically Signed   By: Claudie Revering M.D.   On: 09/22/2020 20:51   CT Head Wo Contrast  Result Date: 09/22/2020 CLINICAL DATA:  Neurological deficit.  Stroke suspected. EXAM: CT HEAD WITHOUT CONTRAST TECHNIQUE: Contiguous axial images were obtained from the base of the skull through the vertex without intravenous contrast. COMPARISON:  Brain MR dated 08/25/2014 FINDINGS: Brain: Mildly enlarged ventricles and cortical sulci. Mild-to-moderate patchy white matter low density in both cerebral hemispheres. No intracranial hemorrhage, mass lesion or CT evidence of acute  infarction. Vascular: No hyperdense vessel or unexpected calcification. Skull: Normal. Negative for fracture or focal lesion. Sinuses/Orbits: Unremarkable. Other: None. IMPRESSION: 1. No acute abnormality. 2. Mild diffuse cerebral and cerebellar atrophy. 3. Mild to moderate chronic small vessel white matter ischemic changes in both cerebral hemispheres. Electronically Signed   By: Claudie Revering M.D.   On: 09/22/2020 20:43   DG Chest Port 1 View  Result Date: 09/22/2020 CLINICAL DATA:  Malaise EXAM: PORTABLE CHEST 1 VIEW COMPARISON:  09/08/2020, CT 08/07/2014 FINDINGS: Cardiomegaly. Small bilateral pleural effusions. Mild diffuse interstitial and ground-glass opacity, probable mild pulmonary edema. Aortic atherosclerosis. No pneumothorax. IMPRESSION: Cardiomegaly with small bilateral pleural effusions and probable mild pulmonary edema. Electronically Signed   By: Donavan Foil M.D.   On: 09/22/2020 19:49    ED Course/Procedures   Clinical Course as of 09/23/20 0023  Sat Sep 22, 2020  2303 Consulted with neurology, Dr. Curly Shores.  She recommends MR brain wo, though symptoms seem most consistent with a mild clonus likely due to  her gabapentin and chronic renal insufficiency.  Outpatient follow up with neurology as long as confusion is not requiring additional AMS workup.  Stressed importance of not missing dialysis.  Discontinued gabapentin  [JR]    Clinical Course User Index [JR] Laurene Melendrez, [place] N, PA-C    Procedures  MDM  Patient with CT scan findings as above.  Her repeat abdominal exam reveals some mild generalized tenderness without guarding or rebound.  No large distention.  Considered SBP however patient is not peritonitic, just tender.  She is not actively vomiting.  Nausea is improved on evaluation.  Normal work of breathing.  Some abnormal findings on her neurologic exam, including poor coordination, abnormal movements, questionable left lip droop at rest droop though does have equal  symmetric smile.  Speech sounded slurred though unclear if this is related to her absence of teeth.  Per review of previous documentation of ED notes, patient has been noted to have abnormal coordination in the past.  She states these movements of her arms have been bothering her lately.  No particular time onset reported.  She states she will be holding something and then all of a sudden lose her grip.  Reviewed her medication list, she does take gabapentin.  Discussed with attending physician Dr. Dorie Rank who recommends neurology consult for recommendations.  Regarding abdominal complaints and CT findings, believe she is appropriate for outpatient follow-up.   Discussed with Dr. Curly Shores with neurology.  Recommendations as above and clinical course.  MRI ordered.  Patient is agreement this plan.  Denies any implanted devices or claustrophobia.  Oncoming provider, Jarrett Soho Muthersbaugh PA-C to follow MRI and disposition.       Eliese Kerwood, [place] N, PA-C 09/23/20 0024    Daleen Bo, MD 09/24/20 [phone]

## 2020-09-22 NOTE — ED Notes (Addendum)
Per patient's son patient had dialysis on Thursday. States she gets into fits where she "stops" mid movement. Has also been confused. States that diarrhea has been present for "months", has eaten today w/o difficult. Missed dialysis today d/t feeling unwell. Per son patient was also told that her HBG at dialysis was 8.

## 2020-09-22 NOTE — ED Triage Notes (Signed)
Patient coming from home with abdominal pain, diarrhea and weakness, hx of ESRD T/TH/S, states she missed dialysis today due to not feeling well.

## 2020-09-23 ENCOUNTER — Emergency Department (HOSPITAL_COMMUNITY): Payer: Medicare Other

## 2020-09-23 DIAGNOSIS — R29818 Other symptoms and signs involving the nervous system: Secondary | ICD-10-CM | POA: Diagnosis not present

## 2020-09-23 DIAGNOSIS — R109 Unspecified abdominal pain: Secondary | ICD-10-CM | POA: Diagnosis not present

## 2020-09-23 NOTE — ED Notes (Signed)
Patient verbalized understanding of discharge instructions. Opportunity for questions and answers.  

## 2020-09-23 NOTE — ED Notes (Addendum)
Spoke to daughter Bindi Klomp, she informed rn that pts son will be on the way to pick pt up

## 2020-09-23 NOTE — ED Notes (Signed)
Pat to MRI. Patient's daughter called and updated on plan of care

## 2020-09-23 NOTE — ED Provider Notes (Signed)
Care assumed from [facility].  Please see her full H&P.  In short,  Brandy Houston is a 65 y.o. female presents for generalized weakness, difficulty standing and walking and potentially increased confusion.  Patient complaining of abdominal pain on initial provider exam.  Did take her usual medications but did not go to dialysis today.  Large work-up by initial provider.  CT scan with moderate amount of free peritoneal fluid.  On initial provider exam abdomen minimally tender.   Physical Exam  BP (!) 142/70   Pulse 80   Temp 98.7 F (37.1 C) (Oral)   Resp (!) 8   Ht 5\' 7"  (1.702 m)   Wt 51.5 kg   LMP 03/16/2002   SpO2 99%   BMI 17.78 kg/m   Physical Exam Vitals and nursing note reviewed.  Constitutional:      General: She is not in acute distress.    Appearance: She is well-developed.     Comments: Chronically ill-appearing  HENT:     Head: Normocephalic.  Eyes:     General: No scleral icterus.    Conjunctiva/sclera: Conjunctivae normal.  Cardiovascular:     Rate and Rhythm: Normal rate.  Pulmonary:     Effort: Pulmonary effort is normal.  Abdominal:     General: There is no distension.     Tenderness: There is abdominal tenderness (mild, generalized). There is no guarding or rebound.  Musculoskeletal:        General: Normal range of motion.     Cervical back: Normal range of motion.  Skin:    General: Skin is warm and dry.  Neurological:     Mental Status: She is alert.     ED Course/Procedures   Clinical Course as of 09/23/20 0234  Sat Sep 22, 2020  2303 Consulted with neurology, Dr. Curly Shores.  She recommends MR brain wo, though symptoms seem most consistent with a mild clonus likely due to her gabapentin and chronic renal insufficiency.  Outpatient follow up with neurology as long as confusion is not requiring additional AMS workup.  Stressed importance of not missing dialysis.  Discontinued gabapentin  [JR]    Clinical Course User Index [JR] Robinson,  [place] N, PA-C    Procedures   Results for orders placed or performed during the hospital encounter of 09/22/20  Lipase, blood  Result Value Ref Range   Lipase 25 11 - 51 U/L  Comprehensive metabolic panel  Result Value Ref Range   Sodium 136 135 - 145 mmol/L   Potassium 3.7 3.5 - 5.1 mmol/L   Chloride 96 (L) 98 - 111 mmol/L   CO2 29 22 - 32 mmol/L   Glucose, Bld 138 (H) 70 - 99 mg/dL   BUN 12 8 - 23 mg/dL   Creatinine, Ser 4.74 (H) 0.44 - 1.00 mg/dL   Calcium 9.6 8.9 - 10.3 mg/dL   Total Protein 5.5 (L) 6.5 - 8.1 g/dL   Albumin 2.3 (L) 3.5 - 5.0 g/dL   AST 16 15 - 41 U/L   ALT 22 0 - 44 U/L   Alkaline Phosphatase 72 38 - 126 U/L   Total Bilirubin 0.8 0.3 - 1.2 mg/dL   GFR, Estimated 10 (L) >60 mL/min   Anion gap 11 5 - 15  CBC  Result Value Ref Range   WBC 6.3 4.0 - 10.5 K/uL   RBC 2.88 (L) 3.87 - 5.11 MIL/uL   Hemoglobin 8.8 (L) 12.0 - 15.0 g/dL   HCT 29.1 (L) 36.0 -  46.0 %   MCV 101.0 (H) 80.0 - 100.0 fL   MCH 30.6 26.0 - 34.0 pg   MCHC 30.2 30.0 - 36.0 g/dL   RDW 19.6 (H) 11.5 - 15.5 %   Platelets 212 150 - 400 K/uL   nRBC 0.0 0.0 - 0.2 %   CT Abdomen Pelvis Wo Contrast  Result Date: 09/22/2020 CLINICAL DATA:  Abdominal pain. Clinical concern for abscess or infection. EXAM: CT ABDOMEN AND PELVIS WITHOUT CONTRAST TECHNIQUE: Multidetector CT imaging of the abdomen and pelvis was performed following the standard protocol without IV contrast. COMPARISON:  01/27/2020 FINDINGS: Lower chest: Enlarged heart. Diffuse low density of the blood. Small pericardial effusion, decreased in size. Small to moderate-sized right pleural effusion and small left pleural effusion. Patchy interstitial prominence and some atelectasis at both posterior lung bases. Hepatobiliary: Somewhat nodular liver contours with a small right lobe and enlarged lateral segment left lobe and caudate lobe. Cholecystectomy clips. Pancreas: Unremarkable. No pancreatic ductal dilatation or surrounding inflammatory  changes. Spleen: Normal in size without focal abnormality. Adrenals/Urinary Tract: Stable diffusely thickened left adrenal gland and normal appearing right adrenal gland. Unremarkable kidneys, ureters and urinary bladder. Stomach/Bowel: Multiple sigmoid colon diverticula without evidence of diverticulitis. Unremarkable stomach and small bowel. Metallic artifacts in the pelvis obscuring the expected region of the appendix, surgically absent by history. Vascular/Lymphatic: Atheromatous arterial calcifications without aneurysm. No enlarged lymph nodes. Reproductive: Partially calcified uterine fibroids. No visible adnexal mass. Other: Moderate amount of free peritoneal fluid. Diffuse subcutaneous edema. Musculoskeletal: Stable old lumbar vertebral compression deformities. No acute fractures or subluxations. L5-S1 degenerative changes. IMPRESSION: 1. No acute abnormality. 2. Moderate amount of free peritoneal fluid. 3. Diffuse subcutaneous edema. 4. Small to moderate-sized right pleural effusion and small left pleural effusion. 5. Patchy interstitial prominence and some atelectasis at both posterior lung bases, compatible with interstitial pulmonary edema. 6. Cardiomegaly. 7. Diffuse low density of the blood, compatible with anemia. 8. Sigmoid diverticulosis. 9. Changes compatible with cirrhosis of the liver. Electronically Signed   By: Claudie Revering M.D.   On: 09/22/2020 20:51   CT Head Wo Contrast  Result Date: 09/22/2020 CLINICAL DATA:  Neurological deficit.  Stroke suspected. EXAM: CT HEAD WITHOUT CONTRAST TECHNIQUE: Contiguous axial images were obtained from the base of the skull through the vertex without intravenous contrast. COMPARISON:  Brain MR dated 08/25/2014 FINDINGS: Brain: Mildly enlarged ventricles and cortical sulci. Mild-to-moderate patchy white matter low density in both cerebral hemispheres. No intracranial hemorrhage, mass lesion or CT evidence of acute infarction. Vascular: No hyperdense vessel  or unexpected calcification. Skull: Normal. Negative for fracture or focal lesion. Sinuses/Orbits: Unremarkable. Other: None. IMPRESSION: 1. No acute abnormality. 2. Mild diffuse cerebral and cerebellar atrophy. 3. Mild to moderate chronic small vessel white matter ischemic changes in both cerebral hemispheres. Electronically Signed   By: Claudie Revering M.D.   On: 09/22/2020 20:43   MR Brain Wo Contrast (neuro protocol)  Result Date: 09/23/2020 CLINICAL DATA:  Acute neurologic deficit EXAM: MRI HEAD WITHOUT CONTRAST TECHNIQUE: Multiplanar, multiecho pulse sequences of the brain and surrounding structures were obtained without intravenous contrast. COMPARISON:  08/25/2014 FINDINGS: Brain: No acute infarct, mass effect or extra-axial collection. No acute or chronic hemorrhage. There is multifocal hyperintense T2-weighted signal within the white matter. Parenchymal volume and CSF spaces are normal. The midline structures are normal. Vascular: Major flow voids are preserved. Skull and upper cervical spine: Normal calvarium and skull base. Visualized upper cervical spine and soft tissues are normal. Sinuses/Orbits:No paranasal sinus  fluid levels or advanced mucosal thickening. No mastoid or middle ear effusion. Normal orbits. IMPRESSION: 1. No acute intracranial abnormality. 2. Findings of chronic microvascular ischemia. Electronically Signed   By: [name] M.D.   On: 09/23/2020 01:48   DG Chest Port 1 View  Result Date: 09/22/2020 CLINICAL DATA:  Malaise EXAM: PORTABLE CHEST 1 VIEW COMPARISON:  09/08/2020, CT 08/07/2014 FINDINGS: Cardiomegaly. Small bilateral pleural effusions. Mild diffuse interstitial and ground-glass opacity, probable mild pulmonary edema. Aortic atherosclerosis. No pneumothorax. IMPRESSION: Cardiomegaly with small bilateral pleural effusions and probable mild pulmonary edema. Electronically Signed   By: Donavan Foil M.D.   On: 09/22/2020 19:49   DG Chest Port 1 View  Result Date:  09/08/2020 CLINICAL DATA:  Chest pain. EXAM: PORTABLE CHEST 1 VIEW COMPARISON:  10/22/2019 FINDINGS: Marked cardiac enlargement. Aortic atherosclerosis. Small left pleural effusion is new from previous exam. Mild diffuse pulmonary vascular congestion. IMPRESSION: 1. New small left pleural effusion. 2. Cardiac enlargement and pulmonary vascular congestion. 3.  Aortic Atherosclerosis (ICD10-I70.0). Electronically Signed   By: Kerby Moors M.D.   On: 09/08/2020 13:22   DG Abd 2 Views  Result Date: 09/10/2020 CLINICAL DATA:  Assess for location of capsule camera. EXAM: ABDOMEN - 2 VIEW COMPARISON:  August 29, 2014. FINDINGS: Surgical clips overlie the right upper quadrant and the pelvis. The bowel gas pattern is normal. Moderate volume of formed stool throughout the colon. There is no evidence of free air. Spondylosis. IMPRESSION: Nonobstructive bowel gas pattern. Moderate volume of formed stool throughout the colon. Radiopaque capsule camera is not visualized. Electronically Signed   By: Dahlia Bailiff MD   On: 09/10/2020 19:19     MDM   On my exam patient alert and oriented.  Minimal tenderness without rebound or guarding.  I agree that patient less likely has SBP.  With normal work of breathing and labs are reassuring.  No evidence of acute fluid overload.  Patient does not need emergent dialysis at this time however will be cautioned to return immediately for worsening symptoms or increase shortness of breath as dialysis is not open today which is Sunday.  Initial provider found some abnormal findings on the neurologic exam which appears to be more clonic in nature.  Suspicious this is secondary to gabapentin usage and chronic renal insufficiency.  Initial provider discussed with neurology who recommended MRI and if this is negative patient may be discharged home for outpatient follow-up.  2:38 AM Patient well-appearing.  Abdomen remains soft without rebound or guarding.  Patient remains afebrile.   Discussed findings with daughter, [name].  They will stop gabapentin and will monitor for fluid overload.  Patient will return if symptoms worsen in any way, she develops fever, vomiting, worsening pain or other concerns.  They will follow with neurology outpatient and patient will be discharged home.    Cassell Voorhies, Gwenlyn Perking 09/23/20 0245    Fatima Blank, MD 09/23/20 [phone]

## 2020-09-23 NOTE — Discharge Instructions (Addendum)
1. Medications: Stop gabapentin, continue usual home medications 2. Treatment: rest,  3. Follow Up: Please followup with your primary doctor in 1-2 days and neurology in 1 week for further evaluation.  Please return to the ER for fevers, chills, worsening confusion, shortness of breath, vomiting, worsening abdominal pain or other concerns.

## 2020-09-23 NOTE — ED Notes (Signed)
[phone] daughter nicole would like an update said she hasn't heard anything about her mom yet.

## 2020-09-25 DIAGNOSIS — D509 Iron deficiency anemia, unspecified: Secondary | ICD-10-CM | POA: Diagnosis not present

## 2020-09-25 DIAGNOSIS — N186 End stage renal disease: Secondary | ICD-10-CM | POA: Diagnosis not present

## 2020-09-25 DIAGNOSIS — E875 Hyperkalemia: Secondary | ICD-10-CM | POA: Diagnosis not present

## 2020-09-25 DIAGNOSIS — D631 Anemia in chronic kidney disease: Secondary | ICD-10-CM | POA: Diagnosis not present

## 2020-09-25 DIAGNOSIS — N2581 Secondary hyperparathyroidism of renal origin: Secondary | ICD-10-CM | POA: Diagnosis not present

## 2020-09-25 DIAGNOSIS — Z992 Dependence on renal dialysis: Secondary | ICD-10-CM | POA: Diagnosis not present

## 2020-09-29 DIAGNOSIS — E1129 Type 2 diabetes mellitus with other diabetic kidney complication: Secondary | ICD-10-CM | POA: Diagnosis not present

## 2020-09-29 DIAGNOSIS — E875 Hyperkalemia: Secondary | ICD-10-CM | POA: Diagnosis not present

## 2020-09-29 DIAGNOSIS — N2581 Secondary hyperparathyroidism of renal origin: Secondary | ICD-10-CM | POA: Diagnosis not present

## 2020-09-29 DIAGNOSIS — N186 End stage renal disease: Secondary | ICD-10-CM | POA: Diagnosis not present

## 2020-09-29 DIAGNOSIS — D631 Anemia in chronic kidney disease: Secondary | ICD-10-CM | POA: Diagnosis not present

## 2020-09-29 DIAGNOSIS — Z992 Dependence on renal dialysis: Secondary | ICD-10-CM | POA: Diagnosis not present

## 2020-09-29 DIAGNOSIS — D509 Iron deficiency anemia, unspecified: Secondary | ICD-10-CM | POA: Diagnosis not present

## 2020-09-30 NOTE — Progress Notes (Signed)
NEUROLOGY CONSULTATION NOTE  Brandy Houston MRN: [id number] DOB: 10-Jul-1955  Referring provider: Joanell Rising, PA-C Primary care provider: Billey Chang, MD  Reason for consult:  Diabetic neuropathy  Assessment/Plan:   1.  Painful diabetic peripheral neuropathy - gabapentin discontinued due to myoclonus - looking for alternative medication.  I would start with nortriptyline (TCA) as it is metabolized via the liver.  Would not use Cymbalta (an SNRI) as she is already on an SSRI.  1.  Start nortriptyline 70m at bedtime, titrating to 375mat bedtime.  By time of refill, we can increase to 5048mf needed/tolerated. 2.  Follow up 6 months.    Subjective:  Brandy Houston a 65 95ar old right-handed female with CHF, type 2 diabetes with neuropathy, chronic anemia, ESRD, HTN and HLD who presents for diabetic neuropathy.  She is accompanied by her son.  History supplemented by ED note.  The patient has multiple medical comorbidities.  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  She has ESRD on HD and has painful diabetic neuropathy and neuropathy at the stump of her left leg for which she takes gabapentin 300m21m dialysis days alternating with 200mg39moff dialysis days.  On 09/22/2020, she presented to the ED for generalized weakness, myoclonus, difficulty walking, abdominal pain and confusion.  MRI of brain personally reviewed showed chronic small vessel ischemic changes but no acute abnormality.  CBC showed stable anemia but no evidence of infection.  CMP showed stable Cr of 4.74 without any significant electrolyte abnormalities.  CT abdomen did not reveal any acute abnormalities such as abscess or cause for abdominal pain.  She took her medications but missed her dialysis that day.  Myoclonus was thought to be secondary to gabapentin, which was discontinued.  Symptoms improved but she has started having neuralgia again       PAST MEDICAL HISTORY: Past Medical History:  Diagnosis Date  . Allergy   . Anemia   . Anxiety   . Arthritis    "in my joints" (03/10/2017)  . Chronic diastolic CHF (congestive heart failure) ([name]) [place] Depression    Chronic  . Diabetic peripheral neuropathy ([name]) [place] Dysrhythmia    tachycardia, normal ECHO 08-09-14  . ESRD (end stage renal disease) ([name]) [place] GERD (gastroesophageal reflux disease)   . Heart murmur     dx'd 02/05/2017  . Hepatitis C    "tx'd in 2016; I'm negative now" (02/05/2017)  . History of blood transfusion 2017   "w/knee replacement"  . Hyperlipidemia   . Hypertension   . Peripheral neuropathy   . Protein calorie malnutrition ([name]) [place] Septic arthritis ([name]) [place] Slow transit constipation   . Symptomatic anemia 01/27/2020  . Type II diabetes mellitus ([name])    IDDM  . Uncontrolled hypertension 02/18/2013    PAST SURGICAL HISTORY: Past Surgical History:  Procedure Laterality Date  . A/V FISTULAGRAM Right 07/07/2018   Procedure: A/V FISTULAGRAM;  Surgeon: ClarkMarty Heck  [facility];  Service: Cardiovascular;  Laterality: Right;  . AMPUTATION Left 03/20/2017   Procedure: LEFT ABOVE KNEE AMPUTATION;  Surgeon: Duda,Newt Minion  [facility];  Laterality: Left;  . APPENDECTOMY    . AV FISTULA PLACEMENT Left 09/13/2014   Procedure: Brachial Artery to Brachial Vein Gortex Four - Seven Stretch GRAFT INSERTION  Left Forearm;  Surgeon: Mal Misty, MD;  Location: [place];  Service: Vascular;  Laterality: Left;  . BASCILIC VEIN TRANSPOSITION Right 10/26/2017   Procedure: BASILIC VEIN TRANSPOSITION FIRST STAGE RIGHT ARM;  Surgeon: Conrad New Hope, MD;  Location: [place];  Service: Vascular;  Laterality: Right;  . BASCILIC VEIN TRANSPOSITION Right 02/01/2018   Procedure: SECOND STAGE BASILIC VEIN TRANSPOSITION RIGHT UPPER EXTREMITY;  Surgeon: Marty Heck, MD;  Location: [place];  Service: Vascular;  Laterality:  Right;  . CHOLECYSTECTOMY OPEN    . COLON SURGERY    . COLONOSCOPY    . ENTEROSCOPY N/A 05/28/2020   Procedure: ENTEROSCOPY;  Surgeon: Mauri Pole, MD;  Location: [facility] ENDOSCOPY;  Service: Endoscopy;  Laterality: N/A;  . ENTEROSCOPY N/A 06/25/2020   Procedure: ENTEROSCOPY;  Surgeon: Milus Banister, MD;  Location: [facility] ENDOSCOPY;  Service: Gastroenterology;  Laterality: N/A;  . ESOPHAGOGASTRODUODENOSCOPY (EGD) WITH PROPOFOL N/A 05/14/2020   Procedure: ESOPHAGOGASTRODUODENOSCOPY (EGD) WITH PROPOFOL;  Surgeon: Mauri Pole, MD;  [facility];  Service: Endoscopy;  Laterality: N/A;  with APC  . EXCISIONAL TOTAL KNEE ARTHROPLASTY WITH ANTIBIOTIC SPACERS Left 02/06/2017   Procedure: Incisional total iknee with antibiotic spacer ;  Surgeon: Leandrew Koyanagi, MD;  Location: [place];  Service: Orthopedics;  Laterality: Left;  . GIVENS CAPSULE STUDY N/A 09/09/2020   Procedure: GIVENS CAPSULE STUDY;  Surgeon: Jackquline Denmark, MD;  Location: [facility] ENDOSCOPY;  Service: Endoscopy;  Laterality: N/A;  . HOT HEMOSTASIS N/A 05/14/2020   Procedure: HOT HEMOSTASIS (ARGON PLASMA COAGULATION/BICAP);  Surgeon: Mauri Pole, MD;  Location: [facility];  Service: Endoscopy;  Laterality: N/A;  . HOT HEMOSTASIS N/A 05/28/2020   Procedure: HOT HEMOSTASIS (ARGON PLASMA COAGULATION/BICAP);  Surgeon: Mauri Pole, MD;  Location: [facility] ENDOSCOPY;  Service: Endoscopy;  Laterality: N/A;  . HOT HEMOSTASIS N/A 06/25/2020   Procedure: HOT HEMOSTASIS (ARGON PLASMA COAGULATION/BICAP);  Surgeon: Milus Banister, MD;  [facility];  Service: Gastroenterology;  Laterality: N/A;  . INTRAVASCULAR PRESSURE WIRE/FFR STUDY N/A 04/13/2020   Procedure: INTRAVASCULAR PRESSURE WIRE/FFR STUDY;  Surgeon: Burnell Blanks, MD;  Location: [place] CV LAB;  Service: Cardiovascular;  Laterality: N/A;  . IR FLUORO GUIDE CV LINE RIGHT  02/10/2017  . IR REMOVAL TUN CV CATH W/O FL  04/01/2017  . IR US GUIDE VASC  ACCESS RIGHT  02/10/2017  . IRRIGATION AND DEBRIDEMENT KNEE Left 03/12/2017   Procedure: IRRIGATION AND DEBRIDEMENT LEFT KNEE WITH WOUND VAC APPLICATION;  Surgeon: Leandrew Koyanagi, MD;  Location: [place];  Service: Orthopedics;  Laterality: Left;  . JOINT REPLACEMENT    . KNEE ARTHROSCOPY Right 08/10/2014   Procedure: ARTHROSCOPY I & D KNEE;  Surgeon: Marianna Payment, MD;  [facility];  Service: Orthopedics;  Laterality: Right;  . KNEE ARTHROSCOPY Left 08/11/2014   Procedure: ARTHROSCOPIC WASHOUT LEFT KNEE;  Surgeon: Marianna Payment, MD;  Location: [place];  Service: Orthopedics;  Laterality: Left;  . KNEE ARTHROSCOPY Left 08/19/2014   Procedure: ARTHROSCOPIC WASHOUT LEFT KNEE;  Surgeon: Leandrew Koyanagi, MD;  Location: [place];  Service: Orthopedics;  Laterality: Left;  . KNEE ARTHROSCOPY WITH LATERAL MENISECTOMY Left 04/04/2015   Procedure: AND PARTIAL LATERAL MENISECTOMY;  Surgeon: Leandrew Koyanagi, MD;  Location: [place];  Service: Orthopedics;  Laterality: Left;  . KNEE ARTHROSCOPY WITH MEDIAL MENISECTOMY Left 04/04/2015   Procedure: LEFT KNEE ARTHROSCOPY WITH PARTIAL MEDIAL MENISCECTOMY  AND SYNOVECTOMY;  Surgeon: Leandrew Koyanagi, MD;  Location: [place];  Service: Orthopedics;  Laterality: Left;  . PERIPHERAL VASCULAR BALLOON ANGIOPLASTY  07/07/2018   Procedure: PERIPHERAL VASCULAR BALLOON ANGIOPLASTY;  Surgeon: Marty Heck, MD;  Location: [place] CV LAB;  Service: Cardiovascular;;  right AV fistula  . POLYPECTOMY    . RIGHT/LEFT HEART CATH AND CORONARY ANGIOGRAPHY N/A 04/13/2020   Procedure: RIGHT/LEFT HEART CATH AND CORONARY ANGIOGRAPHY;  Surgeon: Burnell Blanks, MD;  Location: [place] CV LAB;  Service: Cardiovascular;  Laterality: N/A;  . SHOULDER ARTHROSCOPY Bilateral 08/10/2014   Procedure: I & D BILATERAL SHOULDERS ;  Surgeon: Marianna Payment, MD;  [facility];  Service: Orthopedics;  Laterality: Bilateral;  . SMALL INTESTINE  SURGERY     Due to Small Bowel Obstruction; "fixed it when they did my gallbladder OR"  . TEE WITHOUT CARDIOVERSION N/A 08/14/2014   Procedure: TRANSESOPHAGEAL ECHOCARDIOGRAM (TEE);  Surgeon: Thayer Headings, MD;  Location: [place];  Service: Cardiovascular;  Laterality: N/A;  . TENOSYNOVECTOMY Right 08/11/2014   Procedure: RIGHT WRIST IRRIGATION AND DEBRIDEMENT, TENOSYNOVECTOMY;  Surgeon: Marianna Payment, MD;  Location: [place];  Service: Orthopedics;  Laterality: Right;  . TOTAL KNEE ARTHROPLASTY Left 11/07/2015   Procedure: LEFT TOTAL KNEE ARTHROPLASTY WITH REVISION OF IMPLANTS;  Surgeon: Leandrew Koyanagi, MD;  Location: [place];  Service: Orthopedics;  Laterality: Left;  . TUBAL LIGATION      MEDICATIONS: Current Outpatient Medications on File Prior to Visit  Medication Sig Dispense Refill  . Accu-Chek Softclix Lancets lancets Check sugars three times a day for E11.65 100 each 12  . acetaminophen (TYLENOL) 500 MG tablet Take 1,000 mg by mouth every 6 (six) hours as needed (for pain).    [name] ascorbic acid (VITAMIN C) 500 MG tablet Take 1 tablet (500 mg total) by mouth daily.    [name] atorvastatin (LIPITOR) 10 MG tablet Take 1 tablet (10 mg total) by mouth at bedtime.    . blood glucose meter kit and supplies KIT Dispense based on patient and insurance preference. Use up to four times daily as directed 1 each 0  . Blood Glucose Monitoring Suppl (ACCU-CHEK GUIDE) w/Device KIT 1 Device by Does not apply route daily. 1 kit 3  . calcium acetate (PHOSLO) 667 MG capsule Take 1,334-2,001 mg by mouth See admin instructions. Take 2,001 mg by mouth in the morning with breakfast, 1,334 mg with lunch (IF consumed), and 2,001 mg with dinner/evening meal    . diltiazem 2 % GEL Apply 1 application topically 3 (three) times daily. Apply to affected area on anus (Patient taking differently: Apply 1 application topically 3 (three) times daily as needed (for irritation on/in the anus).) 90 g 1  . diphenhydrAMINE  (BENADRYL) 25 mg capsule Take 25 mg by mouth every 6 (six) hours as needed for allergies.    . famotidine (PEPCID) 40 MG tablet Take 1 tablet (40 mg total) by mouth daily. 30 tablet 1  . gabapentin (NEURONTIN) 100 MG capsule Take 200 mg by mouth See admin instructions. Take 2 capsules (200 mg) by mouth 3 times on Sundays, Mondays, Wednesdays, & Fridays    . gabapentin (NEURONTIN) 300 MG capsule Take 300 mg by mouth See admin instructions. Take 300 mg by mouth with lunch on Tuesdays, Thursdays, & Saturdays    . glucose blood (ACCU-CHEK GUIDE) test strip Check blood sugar up to 4 times a day 100 each 12  . hydrALAZINE (APRESOLINE) 100 MG tablet Take 100 mg by mouth See  admin instructions. Take 100 mg by mouth three times a day on Sun/Mon/Wed/Fri (NON-DIALYSIS DAYS)    . hydrocortisone (ANUSOL-HC) 25 MG suppository Place 1 suppository (25 mg total) rectally 2 (two) times daily. (Patient taking differently: Place 25 mg rectally 2 (two) times daily as needed for hemorrhoids or anal itching.) 12 suppository 0  . Iron, Ferrous Sulfate, 325 (65 Fe) MG TABS Take 325 mg by mouth daily. Note that this medication will turn stool black and may cause constipation.    . isosorbide mononitrate (IMDUR) 30 MG 24 hr tablet Take 1 tablet (30 mg total) by mouth daily. 90 tablet 3  . Lancet Devices (ACCU-CHEK SOFTCLIX) lancets Check sugars TID for E11.65 1 each 10  . lidocaine-prilocaine (EMLA) cream Apply 1 application topically Every Tuesday,Thursday,and Saturday with dialysis. On days of dialysis 3x week    . methocarbamol (ROBAXIN) 500 MG tablet Take 500 mg by mouth every 8 (eight) hours as needed for muscle spasms.  (Patient not taking: Reported on 09/08/2020)    . misoprostol (CYTOTEC) 100 MCG tablet TAKE 1 TABLET BY MOUTH EVERY 6 HOURS (Patient taking differently: Take 100 mcg by mouth every 6 (six) hours.) 120 tablet 0  . multivitamin (RENA-VIT) TABS tablet Take 1 tablet by mouth in the morning.    . nitroGLYCERIN  (NITROSTAT) 0.4 MG SL tablet Place 1 tablet (0.4 mg total) under the tongue every 5 (five) minutes as needed. (Patient taking differently: Place 0.4 mg under the tongue every 5 (five) minutes as needed for chest pain.) 25 tablet 3  . pantoprazole (PROTONIX) 40 MG tablet Take 1 tablet (40 mg total) by mouth daily. 40 mg BID for 2 months & then once daily (Patient not taking: No sig reported) 90 tablet 1  . polyethylene glycol (MIRALAX / GLYCOLAX) 17 g packet Take 17 g by mouth daily as needed for moderate constipation. (Patient taking differently: Take 17 g by mouth daily as needed for moderate constipation (MIX AND DRINK).)    . sertraline (ZOLOFT) 100 MG tablet Take 2 tablets (200 mg total) by mouth daily. 180 tablet 3  . traZODone (DESYREL) 50 MG tablet Take 50 mg by mouth at bedtime as needed for sleep.    [name] UNABLE TO FIND Prosthetic socket, from [facility] 1 each 0   No current facility-administered medications on file prior to visit.    ALLERGIES: Allergies  Allergen Reactions  . Compazine [Prochlorperazine] Shortness Of Breath, Swelling and Other (See Comments)    TONGUE SWELLS  . Shellfish-Derived Products Anaphylaxis  . Iodinated Diagnostic Agents Hives and Rash  . Omnipaque [Iohexol] Hives  . Tape Other (See Comments)    [place], paper tape caused skin irritation  . Sulfa Antibiotics Rash    FAMILY HISTORY: Family History  Problem Relation Age of Onset  . Uterine cancer Mother   . Heart disease Father   . Hypertension Sister   . Uterine cancer Sister   . Cancer Brother        unsure type  . Diabetes Brother   . Hypertension Brother   . Colon cancer Neg Hx   . Colon polyps Neg Hx   . Esophageal cancer Neg Hx   . Rectal cancer Neg Hx   . Stomach cancer Neg Hx     Objective:  Blood pressure (!) 146/77, pulse 80, resp. rate 20, height _0  (1.702 m), weight 128 lb (58.1 kg), last menstrual period 03/16/2002, SpO2 96 %. General: No acute distress.  Patient appears  well-groomed.   Head:  Normocephalic/atraumatic Eyes:  fundi examined but not visualized Neck: supple, no paraspinal tenderness, full range of motion Back: No paraspinal tenderness Heart: regular rate and rhythm Lungs: Clear to auscultation bilaterally. Vascular: No carotid bruits. Neurological Exam: Mental status: alert and oriented to person, place, and time; speech fluent and not dysarthric, language intact. Cranial nerves: CN I: not tested CN II: pupils equal, round and reactive to light, visual fields intact CN III, IV, VI:  full range of motion, no nystagmus, no ptosis CN V: facial sensation intact. CN VII: upper and lower face symmetric CN VIII: hearing intact CN IX, X: gag intact, uvula midline CN XI: sternocleidomastoid and trapezius muscles intact CN XII: tongue midline Bulk & Tone: normal, no fasciculations. Motor:  muscle strength 4/5 throughout Sensation:  Pinprick and vibratory sensation reduced in right lower extremity (left lower extremity prosthesis) Deep Tendon Reflexes:  1+ throughout,  toes downgoing.   Finger to nose testing:  Without dysmetria.     Gait:  Deferred.  In wheelchair.    Thank you for allowing me to take part in the care of this patient.  Metta Clines, DO

## 2020-10-01 ENCOUNTER — Encounter: Payer: Self-pay | Admitting: Neurology

## 2020-10-01 ENCOUNTER — Ambulatory Visit (INDEPENDENT_AMBULATORY_CARE_PROVIDER_SITE_OTHER): Payer: Medicare Other | Admitting: Neurology

## 2020-10-01 ENCOUNTER — Other Ambulatory Visit: Payer: Self-pay | Admitting: Neurology

## 2020-10-01 ENCOUNTER — Other Ambulatory Visit: Payer: Self-pay

## 2020-10-01 VITALS — BP 146/77 | HR 80 | Resp 20 | Ht 67.0 in | Wt 128.0 lb

## 2020-10-01 DIAGNOSIS — I2510 Atherosclerotic heart disease of native coronary artery without angina pectoris: Secondary | ICD-10-CM | POA: Diagnosis not present

## 2020-10-01 DIAGNOSIS — Z992 Dependence on renal dialysis: Secondary | ICD-10-CM

## 2020-10-01 DIAGNOSIS — E1142 Type 2 diabetes mellitus with diabetic polyneuropathy: Secondary | ICD-10-CM | POA: Diagnosis not present

## 2020-10-01 DIAGNOSIS — N186 End stage renal disease: Secondary | ICD-10-CM | POA: Diagnosis not present

## 2020-10-01 MED ORDER — NORTRIPTYLINE HCL 10 MG PO CAPS: ORAL_CAPSULE | ORAL | 0 refills | Status: DC

## 2020-10-01 NOTE — Patient Instructions (Signed)
1.  For nerve pain, start nortriptyline 10mg  capsule - take 1 pill at bedtime for one week, then 2 pills at bedtime for one week, then 3 pills at bedtime. IF no improvement in nerve pain by end of prescription, contact me and we can increase dose.  DO NOT TAKE TRAZODONE 2.  Follow up 6 months

## 2020-10-02 DIAGNOSIS — E875 Hyperkalemia: Secondary | ICD-10-CM | POA: Diagnosis not present

## 2020-10-02 DIAGNOSIS — D631 Anemia in chronic kidney disease: Secondary | ICD-10-CM | POA: Diagnosis not present

## 2020-10-02 DIAGNOSIS — N186 End stage renal disease: Secondary | ICD-10-CM | POA: Diagnosis not present

## 2020-10-02 DIAGNOSIS — D509 Iron deficiency anemia, unspecified: Secondary | ICD-10-CM | POA: Diagnosis not present

## 2020-10-02 DIAGNOSIS — Z992 Dependence on renal dialysis: Secondary | ICD-10-CM | POA: Diagnosis not present

## 2020-10-02 DIAGNOSIS — N2581 Secondary hyperparathyroidism of renal origin: Secondary | ICD-10-CM | POA: Diagnosis not present

## 2020-10-02 NOTE — Progress Notes (Signed)
.  vne

## 2020-10-03 ENCOUNTER — Telehealth: Payer: Self-pay | Admitting: Neurology

## 2020-10-03 NOTE — Telephone Encounter (Signed)
Left message to start the new Rx.

## 2020-10-03 NOTE — Telephone Encounter (Signed)
Patient called in stating the pharmacy would not fill her new medication because it counteracts another medication she is taking.

## 2020-10-03 NOTE — Telephone Encounter (Signed)
Left message for patient to contact pharmacy she was directed to stop Trazadone in chart per [facility]

## 2020-10-03 NOTE — Telephone Encounter (Signed)
Yes, I am aware.  I still want to start her on the nortriptyline.

## 2020-10-04 DIAGNOSIS — N186 End stage renal disease: Secondary | ICD-10-CM | POA: Diagnosis not present

## 2020-10-04 DIAGNOSIS — N2581 Secondary hyperparathyroidism of renal origin: Secondary | ICD-10-CM | POA: Diagnosis not present

## 2020-10-04 DIAGNOSIS — E875 Hyperkalemia: Secondary | ICD-10-CM | POA: Diagnosis not present

## 2020-10-04 DIAGNOSIS — Z992 Dependence on renal dialysis: Secondary | ICD-10-CM | POA: Diagnosis not present

## 2020-10-04 DIAGNOSIS — D509 Iron deficiency anemia, unspecified: Secondary | ICD-10-CM | POA: Diagnosis not present

## 2020-10-04 DIAGNOSIS — D631 Anemia in chronic kidney disease: Secondary | ICD-10-CM | POA: Diagnosis not present

## 2020-10-05 ENCOUNTER — Other Ambulatory Visit: Payer: Self-pay

## 2020-10-05 ENCOUNTER — Other Ambulatory Visit: Payer: Self-pay | Admitting: Neurology

## 2020-10-05 MED ORDER — NORTRIPTYLINE HCL 10 MG PO CAPS: ORAL_CAPSULE | ORAL | 0 refills | Status: DC

## 2020-10-06 DIAGNOSIS — N186 End stage renal disease: Secondary | ICD-10-CM | POA: Diagnosis not present

## 2020-10-06 DIAGNOSIS — E875 Hyperkalemia: Secondary | ICD-10-CM | POA: Diagnosis not present

## 2020-10-06 DIAGNOSIS — Z992 Dependence on renal dialysis: Secondary | ICD-10-CM | POA: Diagnosis not present

## 2020-10-06 DIAGNOSIS — D631 Anemia in chronic kidney disease: Secondary | ICD-10-CM | POA: Diagnosis not present

## 2020-10-06 DIAGNOSIS — N2581 Secondary hyperparathyroidism of renal origin: Secondary | ICD-10-CM | POA: Diagnosis not present

## 2020-10-06 DIAGNOSIS — D509 Iron deficiency anemia, unspecified: Secondary | ICD-10-CM | POA: Diagnosis not present

## 2020-10-07 ENCOUNTER — Other Ambulatory Visit: Payer: Self-pay | Admitting: Neurology

## 2020-10-07 ENCOUNTER — Telehealth: Payer: Self-pay | Admitting: Neurology

## 2020-10-07 NOTE — Telephone Encounter (Signed)
Pharmacy had reservations about filling the nortriptyline because patient takes sertraline (concern for serotonin syndrome).  I discussed with patient who says sertraline not particularly effective for her, so she will discontinue the sertraline and start nortriptyline.

## 2020-10-07 NOTE — Telephone Encounter (Signed)
Pharmacy has concerns about filling the nortriptyline as patient is taking sertraline.  I discussed with patient and she states sertraline isn't too effective anyway.  Will have her discontinue sertraline and start nortriptyline.  She will taper off sertraline to 100mg  daily for one week, then 50mg  daily for one week, then stop

## 2020-10-08 ENCOUNTER — Telehealth: Payer: Self-pay | Admitting: Neurology

## 2020-10-08 NOTE — Telephone Encounter (Signed)
LMOVM for pt to call us back.

## 2020-10-08 NOTE — Telephone Encounter (Signed)
Already addressed with patient over the weekend.  I have ordered the pharmacy to again fill the prescription.  She is willing to stop the sertraline.  I stated that I want her to decrease sertraline to 1 tablet daily for a week, then 1/2 tablet daily for a week, then stop.  I would like to have [place] contact her to confirm that she is aware of those instructions because I don't want her stopping such high dose of sertraline cold [place]

## 2020-10-09 DIAGNOSIS — D631 Anemia in chronic kidney disease: Secondary | ICD-10-CM | POA: Diagnosis not present

## 2020-10-09 DIAGNOSIS — N186 End stage renal disease: Secondary | ICD-10-CM | POA: Diagnosis not present

## 2020-10-09 DIAGNOSIS — N2581 Secondary hyperparathyroidism of renal origin: Secondary | ICD-10-CM | POA: Diagnosis not present

## 2020-10-09 DIAGNOSIS — D509 Iron deficiency anemia, unspecified: Secondary | ICD-10-CM | POA: Diagnosis not present

## 2020-10-09 DIAGNOSIS — Z992 Dependence on renal dialysis: Secondary | ICD-10-CM | POA: Diagnosis not present

## 2020-10-09 DIAGNOSIS — E875 Hyperkalemia: Secondary | ICD-10-CM | POA: Diagnosis not present

## 2020-10-11 DIAGNOSIS — N186 End stage renal disease: Secondary | ICD-10-CM | POA: Diagnosis not present

## 2020-10-11 DIAGNOSIS — D509 Iron deficiency anemia, unspecified: Secondary | ICD-10-CM | POA: Diagnosis not present

## 2020-10-11 DIAGNOSIS — Z992 Dependence on renal dialysis: Secondary | ICD-10-CM | POA: Diagnosis not present

## 2020-10-11 DIAGNOSIS — D631 Anemia in chronic kidney disease: Secondary | ICD-10-CM | POA: Diagnosis not present

## 2020-10-11 DIAGNOSIS — N2581 Secondary hyperparathyroidism of renal origin: Secondary | ICD-10-CM | POA: Diagnosis not present

## 2020-10-11 DIAGNOSIS — E875 Hyperkalemia: Secondary | ICD-10-CM | POA: Diagnosis not present

## 2020-10-13 DIAGNOSIS — N2581 Secondary hyperparathyroidism of renal origin: Secondary | ICD-10-CM | POA: Diagnosis not present

## 2020-10-13 DIAGNOSIS — D631 Anemia in chronic kidney disease: Secondary | ICD-10-CM | POA: Diagnosis not present

## 2020-10-13 DIAGNOSIS — D509 Iron deficiency anemia, unspecified: Secondary | ICD-10-CM | POA: Diagnosis not present

## 2020-10-13 DIAGNOSIS — N186 End stage renal disease: Secondary | ICD-10-CM | POA: Diagnosis not present

## 2020-10-13 DIAGNOSIS — E875 Hyperkalemia: Secondary | ICD-10-CM | POA: Diagnosis not present

## 2020-10-13 DIAGNOSIS — Z992 Dependence on renal dialysis: Secondary | ICD-10-CM | POA: Diagnosis not present

## 2020-10-14 DIAGNOSIS — E1129 Type 2 diabetes mellitus with other diabetic kidney complication: Secondary | ICD-10-CM | POA: Diagnosis not present

## 2020-10-14 DIAGNOSIS — N186 End stage renal disease: Secondary | ICD-10-CM | POA: Diagnosis not present

## 2020-10-14 DIAGNOSIS — Z992 Dependence on renal dialysis: Secondary | ICD-10-CM | POA: Diagnosis not present

## 2020-10-18 DIAGNOSIS — N186 End stage renal disease: Secondary | ICD-10-CM | POA: Diagnosis not present

## 2020-10-18 DIAGNOSIS — N2581 Secondary hyperparathyroidism of renal origin: Secondary | ICD-10-CM | POA: Diagnosis not present

## 2020-10-18 DIAGNOSIS — Z992 Dependence on renal dialysis: Secondary | ICD-10-CM | POA: Diagnosis not present

## 2020-10-18 DIAGNOSIS — D631 Anemia in chronic kidney disease: Secondary | ICD-10-CM | POA: Diagnosis not present

## 2020-10-19 ENCOUNTER — Telehealth: Payer: Self-pay | Admitting: Pharmacist

## 2020-10-19 NOTE — Telephone Encounter (Addendum)
Called pt and left message to see if she has been able to monitor BP/HR at home. Previously seen by PharmD 3/25 with goal to taper off clonidine and titrate up beta blocker as able for CHF benefit. HR was in the 50s and unable to start beta blocker. Also unable to start other GDMT (ACE/ARB, aldosterone antagonist, and SGLT2i) since pt is on dialysis.  Called pt for follow up 4/8, pt was feeling dizzy frequently, hadn't been checking BP at home. Her low dose carvedilol had been stopped during hospitalization for GI bleed at the end of March after our visit with her.   If able, will plan to start low dose Toprol for CHF benefit/less effect on BP than carvedilol she was previously taking.

## 2020-10-20 DIAGNOSIS — D631 Anemia in chronic kidney disease: Secondary | ICD-10-CM | POA: Diagnosis not present

## 2020-10-20 DIAGNOSIS — N186 End stage renal disease: Secondary | ICD-10-CM | POA: Diagnosis not present

## 2020-10-20 DIAGNOSIS — N2581 Secondary hyperparathyroidism of renal origin: Secondary | ICD-10-CM | POA: Diagnosis not present

## 2020-10-20 DIAGNOSIS — Z992 Dependence on renal dialysis: Secondary | ICD-10-CM | POA: Diagnosis not present

## 2020-10-23 DIAGNOSIS — N2581 Secondary hyperparathyroidism of renal origin: Secondary | ICD-10-CM | POA: Diagnosis not present

## 2020-10-23 DIAGNOSIS — D631 Anemia in chronic kidney disease: Secondary | ICD-10-CM | POA: Diagnosis not present

## 2020-10-23 DIAGNOSIS — Z992 Dependence on renal dialysis: Secondary | ICD-10-CM | POA: Diagnosis not present

## 2020-10-23 DIAGNOSIS — N186 End stage renal disease: Secondary | ICD-10-CM | POA: Diagnosis not present

## 2020-10-25 DIAGNOSIS — D631 Anemia in chronic kidney disease: Secondary | ICD-10-CM | POA: Diagnosis not present

## 2020-10-25 DIAGNOSIS — N186 End stage renal disease: Secondary | ICD-10-CM | POA: Diagnosis not present

## 2020-10-25 DIAGNOSIS — Z992 Dependence on renal dialysis: Secondary | ICD-10-CM | POA: Diagnosis not present

## 2020-10-25 DIAGNOSIS — N2581 Secondary hyperparathyroidism of renal origin: Secondary | ICD-10-CM | POA: Diagnosis not present

## 2020-10-25 NOTE — Telephone Encounter (Signed)
2nd message left for pt.

## 2020-10-26 ENCOUNTER — Other Ambulatory Visit: Payer: Self-pay | Admitting: Family Medicine

## 2020-10-26 NOTE — Telephone Encounter (Signed)
Pt states she having trouble getting up. She unable to use the good  leg now. Pt states she stopped the Sertraline after her ov 10/08/20.    Per pt both of her legs feel weak. Per pt I just want something for pain. Do I need to schedule a visit.  Please advise.

## 2020-10-26 NOTE — Telephone Encounter (Signed)
Patient called and returned the call. She said she is still having trouble with her legs too.

## 2020-10-26 NOTE — Telephone Encounter (Signed)
I don't think a follow up visit is necessary because she has chronic neuropathic pain which we are trying to treat.  I started her on nortriptyline, titrating her slowly to 30mg  at bedtime.  Once she is needing a refill, then we can increase dose to 50mg  at bedtime if pain not improved.  I do not have anything that I can give her in order to quickly stop the pain.  If something more immediate is necessary, then maybe her PCP will have to consider an opioid (if they are comfortable with that) or referral to pain management.

## 2020-10-29 ENCOUNTER — Other Ambulatory Visit: Payer: Self-pay | Admitting: Neurology

## 2020-10-29 MED ORDER — NORTRIPTYLINE HCL 10 MG PO CAPS: 30.0000 mg | ORAL_CAPSULE | Freq: Every day | ORAL | 3 refills | Status: AC

## 2020-10-29 NOTE — Telephone Encounter (Signed)
Refilled nortriptyline.  If no significant improvement in 4 weeks, she should contact us

## 2020-10-29 NOTE — Telephone Encounter (Signed)
Per pt she is taking the three at bedtime and pt will need a refill.  Per pt she thinks she may have a naval hernia. Advise pt she will have to discuss that with her Pcp that no a neurologic problem.

## 2020-10-30 DIAGNOSIS — D631 Anemia in chronic kidney disease: Secondary | ICD-10-CM | POA: Diagnosis not present

## 2020-10-30 DIAGNOSIS — N186 End stage renal disease: Secondary | ICD-10-CM | POA: Diagnosis not present

## 2020-10-30 DIAGNOSIS — Z992 Dependence on renal dialysis: Secondary | ICD-10-CM | POA: Diagnosis not present

## 2020-10-30 DIAGNOSIS — N2581 Secondary hyperparathyroidism of renal origin: Secondary | ICD-10-CM | POA: Diagnosis not present

## 2020-10-31 ENCOUNTER — Other Ambulatory Visit: Payer: Self-pay

## 2020-10-31 ENCOUNTER — Ambulatory Visit (INDEPENDENT_AMBULATORY_CARE_PROVIDER_SITE_OTHER): Payer: Medicare Other | Admitting: Family Medicine

## 2020-10-31 ENCOUNTER — Encounter: Payer: Self-pay | Admitting: Family Medicine

## 2020-10-31 VITALS — BP 128/80 | HR 96 | Temp 98.4°F | Ht 67.0 in | Wt 128.0 lb

## 2020-10-31 DIAGNOSIS — L089 Local infection of the skin and subcutaneous tissue, unspecified: Secondary | ICD-10-CM

## 2020-10-31 DIAGNOSIS — T8789 Other complications of amputation stump: Secondary | ICD-10-CM | POA: Diagnosis not present

## 2020-10-31 DIAGNOSIS — T148XXA Other injury of unspecified body region, initial encounter: Secondary | ICD-10-CM

## 2020-10-31 DIAGNOSIS — M79609 Pain in unspecified limb: Secondary | ICD-10-CM

## 2020-10-31 MED ORDER — DOXYCYCLINE HYCLATE 100 MG PO TABS: 100.0000 mg | ORAL_TABLET | Freq: Two times a day (BID) | ORAL | 0 refills | Status: AC

## 2020-10-31 MED ORDER — METOPROLOL SUCCINATE ER 25 MG PO TB24: 25.0000 mg | ORAL_TABLET | Freq: Every day | ORAL | 11 refills | Status: AC

## 2020-10-31 MED ORDER — HYDROCODONE-ACETAMINOPHEN 5-325 MG PO TABS: 1.0000 | ORAL_TABLET | Freq: Four times a day (QID) | ORAL | 0 refills | Status: AC | PRN

## 2020-10-31 NOTE — Telephone Encounter (Signed)
Called pt again. She reports BP readings have been looking good, 130/69 dialysis recently with a high of 140/80. BP was 146/77, HR 80 at her neuro appt 4/18. She has not been checking BP at home. Discussed CHF benefit in starting beta blocker therapy. Will add metoprolol succinate 25mg  1 tablet daily. She will keep follow up appt with Dr Gardiner Rhyme next month, hopefully can keep titrating her dose.

## 2020-10-31 NOTE — Progress Notes (Signed)
Subjective  CC:  Chief Complaint  Patient presents with  . Blister    Inner leg area, realized about 3 weeks ago. Tender, painful. Left leg    HPI: Brandy Houston is a 65 y.o. female who presents to the office today to address the problems listed above in the chief complaint.  65 year old female with multiple medical problems including end-stage renal disease on chronic dialysis, status post left AKA presents due to painful left stump.  She has been fitted for a new prosthesis because her overall was too big.  They received that last week.  However she cannot use it due to pain.  She has noticed some dehiscence of her old scar with some purulent drainage.  Over the last 48 hours, the area is now tender to touch.  She denies systemic symptoms, specifically no fevers or chills.  She denies trauma to the area.   Assessment  1. Stump pain ([place])   2. Wound infection      Plan   Infection left stump: Start antibiotics, treat pain and monitor.  Short course of hydrocodone for pain control.  Follow up: As needed Visit date not found  No orders of the defined types were placed in this encounter.  Meds ordered this encounter  Medications  . HYDROcodone-acetaminophen (NORCO) 5-325 MG tablet    Sig: Take 1 tablet by mouth every 6 (six) hours as needed for moderate pain.    Dispense:  20 tablet    Refill:  0  . doxycycline (VIBRA-TABS) 100 MG tablet    Sig: Take 1 tablet (100 mg total) by mouth 2 (two) times daily for 7 days.    Dispense:  14 tablet    Refill:  0      I reviewed the patients updated PMH, FH, and SocHx.    Patient Active Problem List   Diagnosis Date Noted  . End stage renal disease ([name]) 04/19/2018    Priority: High  . Secondary hyperparathyroidism of renal origin ([name]) 04/19/2018    Priority: High  . Nicotine dependence, cigarettes, uncomplicated 89/38/1017    Priority: High  . Essential hypertension     Priority: High  . Amputation above knee ([name])      Priority: High  . DM type 2 causing ESRD ([name]) 03/21/2017    Priority: High  . Anemia in chronic kidney disease 02/05/2017    Priority: High  . Chronic diastolic CHF (congestive heart failure) ([name]) 11/08/2015    Priority: High  . Diabetic peripheral neuropathy ([name])     Priority: High  . Chronic kidney disease (CKD), stage IV (severe) ([name])     Priority: High  . ABLA (acute blood loss anemia)     Priority: Medium  . Major depression, recurrent, chronic ([name])     Priority: Medium  . Hepatitis C 08/16/2014    Priority: Medium  . Acute upper GI bleeding 09/08/2020  . Dyslipidemia 09/08/2020  . Esophageal candidiasis ([name]) 06/25/2020  . Intestinal angiodysplasia   . GIB (gastrointestinal bleeding) 06/21/2020  . Lab test positive for detection of COVID-19 virus 06/21/2020  . Rectal bleeding 06/20/2020  . Coronary artery disease of native artery of native heart with stable angina pectoris ([name]) 06/13/2020  . Nonrheumatic aortic valve insufficiency 06/13/2020  . Nonrheumatic tricuspid valve regurgitation 06/13/2020  . GAVE (gastric antral vascular ectasia)   . AVM (arteriovenous malformation) of small bowel, acquired with hemorrhage   . Hip swelling, right 05/27/2020  . Hypokalemia 05/27/2020  . Anemia  of chronic renal failure 05/27/2020  . 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 Acute combined systolic and diastolic heart failure ([name])   . Unstable angina ([name])   . Symptomatic anemia 01/27/2020  . Hypercalcemia 10/06/2019  . Moderate protein-calorie malnutrition ([name]) 05/11/2018  . Iron deficiency anemia, unspecified 04/19/2018  . Nephrotic syndrome with unspecified morphologic changes 04/19/2018  . Peripheral neuropathy 06/24/2013   Current Meds  Medication Sig  . Accu-Chek Softclix Lancets lancets Check sugars three times a day for E11.65  . acetaminophen (TYLENOL) 500 MG tablet Take 1,000 mg by mouth every 6 (six) hours as needed (for pain).  [name]  ascorbic acid (VITAMIN C) 500 MG tablet Take 1 tablet (500 mg total) by mouth daily.  [name] atorvastatin (LIPITOR) 10 MG tablet Take 1 tablet (10 mg total) by mouth at bedtime.  . blood glucose meter kit and supplies KIT Dispense based on patient and insurance preference. Use up to four times daily as directed  . Blood Glucose Monitoring Suppl (ACCU-CHEK GUIDE) w/Device KIT 1 Device by Does not apply route daily.  . calcium acetate (PHOSLO) 667 MG capsule Take 1,334-2,001 mg by mouth See admin instructions. Take 2,001 mg by mouth in the morning with breakfast, 1,334 mg with lunch (IF consumed), and 2,001 mg with dinner/evening meal  . diltiazem 2 % GEL Apply 1 application topically 3 (three) times daily. Apply to affected area on anus (Patient taking differently: Apply 1 application topically 3 (three) times daily as needed (for irritation on/in the anus).)  . diphenhydrAMINE (BENADRYL) 25 mg capsule Take 25 mg by mouth every 6 (six) hours as needed for allergies.  [name] doxycycline (VIBRA-TABS) 100 MG tablet Take 1 tablet (100 mg total) by mouth 2 (two) times daily for 7 days.  . famotidine (PEPCID) 40 MG tablet Take 1 tablet (40 mg total) by mouth daily.  [name] glucose blood (ACCU-CHEK GUIDE) test strip Check blood sugar up to 4 times a day  . hydrALAZINE (APRESOLINE) 100 MG tablet Take 100 mg by mouth See admin instructions. Take 100 mg by mouth three times a day on Sun/Mon/Wed/Fri (NON-DIALYSIS DAYS)  . HYDROcodone-acetaminophen (NORCO) 5-325 MG tablet Take 1 tablet by mouth every 6 (six) hours as needed for moderate pain.  . hydrocortisone (ANUSOL-HC) 25 MG suppository Place 1 suppository (25 mg total) rectally 2 (two) times daily. (Patient taking differently: Place 25 mg rectally 2 (two) times daily as needed for hemorrhoids or anal itching.)  . Iron, Ferrous Sulfate, 325 (65 Fe) MG TABS Take 325 mg by mouth daily. Note that this medication will turn stool black and may cause constipation.  [name]  [facility]) lancets Check sugars TID for E11.65  . lidocaine-prilocaine (EMLA) cream Apply 1 application topically Every Tuesday,Thursday,and Saturday with dialysis. On days of dialysis 3x week  . methocarbamol (ROBAXIN) 500 MG tablet Take 500 mg by mouth every 8 (eight) hours as needed for muscle spasms.  . metoprolol succinate (TOPROL XL) 25 MG 24 hr tablet Take 1 tablet (25 mg total) by mouth daily.  . misoprostol (CYTOTEC) 100 MCG tablet TAKE 1 TABLET BY MOUTH EVERY 6 HOURS  . multivitamin (RENA-VIT) TABS tablet Take 1 tablet by mouth in the morning.  . nortriptyline (PAMELOR) 10 MG capsule Take 3 capsules (30 mg total) by mouth at bedtime.  . polyethylene glycol (MIRALAX / GLYCOLAX) 17 g packet Take 17 g by mouth daily as needed for moderate constipation. (Patient taking differently: Take 17  g by mouth daily as needed for moderate constipation (MIX AND DRINK).)  . UNABLE TO FIND Prosthetic socket, from [facility]    Allergies: Patient is allergic to compazine [prochlorperazine], shellfish-derived products, iodinated diagnostic agents, omnipaque [iohexol], tape, and sulfa antibiotics. Family History: Patient family history includes Cancer in her brother; Diabetes in her brother; Heart disease in her father; Hypertension in her brother and sister; Uterine cancer in her mother and sister. Social History:  Patient  reports that she has been smoking cigarettes. She has a 22.50 pack-year smoking history. She has never used smokeless tobacco. She reports previous alcohol use. She reports that she does not use drugs.  Review of Systems: Constitutional: Negative for fever malaise or anorexia Cardiovascular: negative for chest pain Respiratory: negative for SOB or persistent cough Gastrointestinal: negative for abdominal pain  Objective  Vitals: BP 128/80   Pulse 96   Temp 98.4 F (36.9 C) (Temporal)   Ht '5\' 7"'  (1.702 m)   Wt 128 lb (58.1 kg)   LMP 03/16/2002   BMI 20.05 kg/m   General: Frail thin appearing woman in wheelchair, flat affect Left stump with dehiscence of the surgical scar, some erythema, minimal drainage, no fluctuance but very tender to touch around the entire area.  No warmth.    Commons side effects, risks, benefits, and alternatives for medications and treatment plan prescribed today were discussed, and the patient expressed understanding of the given instructions. Patient is instructed to call or message via MyChart if he/she has any questions or concerns regarding our treatment plan. No barriers to understanding were identified. We discussed Red Flag symptoms and signs in detail. Patient expressed understanding regarding what to do in case of urgent or emergency type symptoms.   Medication list was reconciled, printed and provided to the patient in AVS. Patient instructions and summary information was reviewed with the patient as documented in the AVS. This note was prepared with assistance of Dragon voice recognition software. Occasional wrong-word or sound-a-like substitutions may have occurred due to the inherent limitations of voice recognition software  This visit occurred during the SARS-CoV-2 public health emergency.  Safety protocols were in place, including screening questions prior to the visit, additional usage of staff PPE, and extensive cleaning of exam room while observing appropriate contact time as indicated for disinfecting solutions.

## 2020-10-31 NOTE — Addendum Note (Signed)
Addended by: Jamonte Curfman E on: 10/31/2020 09:56 AM   Modules accepted: Orders

## 2020-10-31 NOTE — Patient Instructions (Signed)
Please return in 3 months for recheck. Sooner if pain not resolving.   I've ordered an antibiotic to take for infection and pain medications. Do not take pain meds with tylenol as there is tylenol with hydrocodone together.   If you have any questions or concerns, please don't hesitate to send me a message via MyChart or call the office at [phone]. Thank you for visiting with [place] today! It's our pleasure caring for you.

## 2020-11-01 ENCOUNTER — Ambulatory Visit: Payer: Medicare Other | Admitting: Gastroenterology

## 2020-11-01 DIAGNOSIS — N2581 Secondary hyperparathyroidism of renal origin: Secondary | ICD-10-CM | POA: Diagnosis not present

## 2020-11-01 DIAGNOSIS — N186 End stage renal disease: Secondary | ICD-10-CM | POA: Diagnosis not present

## 2020-11-01 DIAGNOSIS — Z992 Dependence on renal dialysis: Secondary | ICD-10-CM | POA: Diagnosis not present

## 2020-11-01 DIAGNOSIS — D631 Anemia in chronic kidney disease: Secondary | ICD-10-CM | POA: Diagnosis not present

## 2020-11-03 DIAGNOSIS — N186 End stage renal disease: Secondary | ICD-10-CM | POA: Diagnosis not present

## 2020-11-03 DIAGNOSIS — N2581 Secondary hyperparathyroidism of renal origin: Secondary | ICD-10-CM | POA: Diagnosis not present

## 2020-11-03 DIAGNOSIS — Z992 Dependence on renal dialysis: Secondary | ICD-10-CM | POA: Diagnosis not present

## 2020-11-03 DIAGNOSIS — D631 Anemia in chronic kidney disease: Secondary | ICD-10-CM | POA: Diagnosis not present

## 2020-11-08 ENCOUNTER — Encounter (HOSPITAL_COMMUNITY): Payer: Self-pay | Admitting: *Deleted

## 2020-11-08 ENCOUNTER — Telehealth: Payer: Self-pay

## 2020-11-08 ENCOUNTER — Emergency Department (HOSPITAL_COMMUNITY): Payer: Medicare Other

## 2020-11-08 ENCOUNTER — Emergency Department (HOSPITAL_COMMUNITY)
Admission: EM | Admit: 2020-11-08 | Discharge: 2020-11-08 | Disposition: A | Discharge status: Home / Self Care | Payer: Medicare Other | Attending: Emergency Medicine | Admitting: Emergency Medicine

## 2020-11-08 ENCOUNTER — Other Ambulatory Visit: Payer: Self-pay

## 2020-11-08 DIAGNOSIS — E1122 Type 2 diabetes mellitus with diabetic chronic kidney disease: Secondary | ICD-10-CM | POA: Diagnosis not present

## 2020-11-08 DIAGNOSIS — I25118 Atherosclerotic heart disease of native coronary artery with other forms of angina pectoris: Secondary | ICD-10-CM | POA: Diagnosis not present

## 2020-11-08 DIAGNOSIS — L089 Local infection of the skin and subcutaneous tissue, unspecified: Secondary | ICD-10-CM

## 2020-11-08 DIAGNOSIS — Z79899 Other long term (current) drug therapy: Secondary | ICD-10-CM | POA: Diagnosis not present

## 2020-11-08 DIAGNOSIS — Z992 Dependence on renal dialysis: Secondary | ICD-10-CM | POA: Insufficient documentation

## 2020-11-08 DIAGNOSIS — E1169 Type 2 diabetes mellitus with other specified complication: Secondary | ICD-10-CM | POA: Diagnosis not present

## 2020-11-08 DIAGNOSIS — Z96652 Presence of left artificial knee joint: Secondary | ICD-10-CM | POA: Insufficient documentation

## 2020-11-08 DIAGNOSIS — I132 Hypertensive heart and chronic kidney disease with heart failure and with stage 5 chronic kidney disease, or end stage renal disease: Secondary | ICD-10-CM | POA: Insufficient documentation

## 2020-11-08 DIAGNOSIS — I5041 Acute combined systolic (congestive) and diastolic (congestive) heart failure: Secondary | ICD-10-CM | POA: Insufficient documentation

## 2020-11-08 DIAGNOSIS — M79652 Pain in left thigh: Secondary | ICD-10-CM | POA: Diagnosis not present

## 2020-11-08 DIAGNOSIS — Z8616 Personal history of COVID-19: Secondary | ICD-10-CM | POA: Insufficient documentation

## 2020-11-08 DIAGNOSIS — M25561 Pain in right knee: Secondary | ICD-10-CM | POA: Diagnosis not present

## 2020-11-08 DIAGNOSIS — L989 Disorder of the skin and subcutaneous tissue, unspecified: Secondary | ICD-10-CM | POA: Diagnosis not present

## 2020-11-08 DIAGNOSIS — E785 Hyperlipidemia, unspecified: Secondary | ICD-10-CM | POA: Diagnosis not present

## 2020-11-08 DIAGNOSIS — N186 End stage renal disease: Secondary | ICD-10-CM | POA: Insufficient documentation

## 2020-11-08 DIAGNOSIS — I517 Cardiomegaly: Secondary | ICD-10-CM | POA: Diagnosis not present

## 2020-11-08 DIAGNOSIS — L97121 Non-pressure chronic ulcer of left thigh limited to breakdown of skin: Secondary | ICD-10-CM

## 2020-11-08 DIAGNOSIS — F17210 Nicotine dependence, cigarettes, uncomplicated: Secondary | ICD-10-CM | POA: Diagnosis not present

## 2020-11-08 DIAGNOSIS — T8789 Other complications of amputation stump: Secondary | ICD-10-CM

## 2020-11-08 DIAGNOSIS — J90 Pleural effusion, not elsewhere classified: Secondary | ICD-10-CM | POA: Diagnosis not present

## 2020-11-08 DIAGNOSIS — L97921 Non-pressure chronic ulcer of unspecified part of left lower leg limited to breakdown of skin: Secondary | ICD-10-CM | POA: Insufficient documentation

## 2020-11-08 DIAGNOSIS — Z89611 Acquired absence of right leg above knee: Secondary | ICD-10-CM | POA: Insufficient documentation

## 2020-11-08 DIAGNOSIS — J811 Chronic pulmonary edema: Secondary | ICD-10-CM | POA: Diagnosis not present

## 2020-11-08 DIAGNOSIS — I120 Hypertensive chronic kidney disease with stage 5 chronic kidney disease or end stage renal disease: Secondary | ICD-10-CM | POA: Diagnosis not present

## 2020-11-08 LAB — CBC WITH DIFFERENTIAL/PLATELET
Abs Immature Granulocytes: 0.07 10*3/uL (ref 0.00–0.07)
Basophils Absolute: 0 10*3/uL (ref 0.0–0.1)
Basophils Relative: 0 %
Eosinophils Absolute: 0 10*3/uL (ref 0.0–0.5)
Eosinophils Relative: 0 %
HCT: 30.2 % — ABNORMAL LOW (ref 36.0–46.0)
Hemoglobin: 9.9 g/dL — ABNORMAL LOW (ref 12.0–15.0)
Immature Granulocytes: 1 %
Lymphocytes Relative: 2 %
Lymphs Abs: 0.2 10*3/uL — ABNORMAL LOW (ref 0.7–4.0)
MCH: 28 pg (ref 26.0–34.0)
MCHC: 32.8 g/dL (ref 30.0–36.0)
MCV: 85.3 fL (ref 80.0–100.0)
Monocytes Absolute: 0.2 10*3/uL (ref 0.1–1.0)
Monocytes Relative: 2 %
Neutro Abs: 9.6 10*3/uL — ABNORMAL HIGH (ref 1.7–7.7)
Neutrophils Relative %: 95 %
Platelets: 193 10*3/uL (ref 150–400)
RBC: 3.54 MIL/uL — ABNORMAL LOW (ref 3.87–5.11)
RDW: 17.9 % — ABNORMAL HIGH (ref 11.5–15.5)
WBC: 10.1 10*3/uL (ref 4.0–10.5)
nRBC: 0 % (ref 0.0–0.2)

## 2020-11-08 LAB — BASIC METABOLIC PANEL
Anion gap: 12 (ref 5–15)
BUN: 41 mg/dL — ABNORMAL HIGH (ref 8–23)
CO2: 27 mmol/L (ref 22–32)
Calcium: 6.9 mg/dL — ABNORMAL LOW (ref 8.9–10.3)
Chloride: 95 mmol/L — ABNORMAL LOW (ref 98–111)
Creatinine, Ser: 5.97 mg/dL — ABNORMAL HIGH (ref 0.44–1.00)
GFR, Estimated: 7 mL/min — ABNORMAL LOW (ref >60)
Glucose, Bld: 110 mg/dL — ABNORMAL HIGH (ref 70–99)
Potassium: 4.2 mmol/L (ref 3.5–5.1)
Sodium: 134 mmol/L — ABNORMAL LOW (ref 135–145)

## 2020-11-08 MED ORDER — OXYCODONE-ACETAMINOPHEN 5-325 MG PO TABS
1.0000 | ORAL_TABLET | Freq: Once | ORAL | Status: AC
  Administered 2020-11-08: 1 via ORAL
  Filled 2020-11-08: qty 1

## 2020-11-08 MED ORDER — OXYCODONE-ACETAMINOPHEN 5-325 MG PO TABS: 1.0000 | ORAL_TABLET | Freq: Four times a day (QID) | ORAL | 0 refills | Status: DC | PRN

## 2020-11-08 MED ORDER — DOXYCYCLINE HYCLATE 100 MG PO CAPS: 100.0000 mg | ORAL_CAPSULE | Freq: Two times a day (BID) | ORAL | 0 refills | Status: AC

## 2020-11-08 NOTE — ED Provider Notes (Signed)
[place] EMERGENCY DEPARTMENT Provider Note   CSN: [id number] Arrival date & time: 11/08/20  1333     History Chief Complaint  Patient presents with  . Wound Infection    Brandy Houston is a 65 y.o. female.  HPI  65 yo female ho esrd on dialysis presents with pain and discharge from left aka stump.  Patient has a history of poor circulation and reports fall 2 weeks ago after which she noted increasing pain to left stump and some intermittent pain of right knee.  She states she has a wheelchair at home,but has not been to dialysis for one week due to pain.  SHe denies dyspnea, cough, fever, or chills.  She did have COVID 3 weeks ago and had previously had vaccination and booster.  She reports that she feels that she is better from her COVID.  She denies any headache, head injury, vision changes, nasal problems, sore throat, chest pain, dyspnea, abdominal pain, nausea, vomiting, or diarrhea. Patient reports that Dr. Sharol Given performed the AKA but she has not seen him in the 2 years since that time. She reports that she saw Dr. Billey Chang, who is her primary care doctor, a week ago.  She was started on antibiotics and reports that she was told to be reevaluated if it did not get better.     Past Medical History:  Diagnosis Date  . Allergy   . Anemia   . Anxiety   . Arthritis    "in my joints" (03/10/2017)  . Chronic diastolic CHF (congestive heart failure) ([place])   . Depression    Chronic  . Diabetic peripheral neuropathy ([place])   . Dysrhythmia    tachycardia, normal ECHO 08-09-14  . ESRD (end stage renal disease) ([place])   . GERD (gastroesophageal reflux disease)   . Heart murmur     dx'd 02/05/2017  . Hepatitis C    "tx'd in 2016; I'm negative now" (02/05/2017)  . History of blood transfusion 2017   "w/knee replacement"  . Hyperlipidemia   . Hypertension   . Peripheral neuropathy   . Protein calorie malnutrition ([place])   . Septic arthritis ([place])   . Slow  transit constipation   . Symptomatic anemia 01/27/2020  . Type II diabetes mellitus ([name])    IDDM  . Uncontrolled hypertension 02/18/2013    Patient Active Problem List   Diagnosis Date Noted  . Acute upper GI bleeding 09/08/2020  . Dyslipidemia 09/08/2020  . Esophageal candidiasis ([place]) 06/25/2020  . Intestinal angiodysplasia   . GIB (gastrointestinal bleeding) 06/21/2020  . Lab test positive for detection of COVID-19 virus 06/21/2020  . Rectal bleeding 06/20/2020  . Coronary artery disease of native artery of native heart with stable angina pectoris ([place]) 06/13/2020  . Nonrheumatic aortic valve insufficiency 06/13/2020  . Nonrheumatic tricuspid valve regurgitation 06/13/2020  . GAVE (gastric antral vascular ectasia)   . AVM (arteriovenous malformation) of small bowel, acquired with hemorrhage   . Hip swelling, right 05/27/2020  . Hypokalemia 05/27/2020  . Anemia of chronic renal failure 05/27/2020  . 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 Acute combined systolic and diastolic heart failure ([place])   . Unstable angina ([place])   . Symptomatic anemia 01/27/2020  . Hypercalcemia 10/06/2019  . Moderate protein-calorie malnutrition ([place]) 05/11/2018  . End stage renal disease ([place]) 04/19/2018  . Iron deficiency anemia, unspecified 04/19/2018  . Nephrotic syndrome with unspecified morphologic changes 04/19/2018  . Secondary hyperparathyroidism  of renal origin ([place]) 04/19/2018  . Nicotine dependence, cigarettes, uncomplicated 16/03/9603  . Essential hypertension   . Amputation above knee ([place])   . ABLA (acute blood loss anemia)   . DM type 2 causing ESRD ([place]) 03/21/2017  . Anemia in chronic kidney disease 02/05/2017  . Chronic diastolic CHF (congestive heart failure) ([place]) 11/08/2015  . Diabetic peripheral neuropathy ([place])   . Major depression, recurrent, chronic ([place])   . Chronic kidney disease (CKD), stage IV (severe) ([place])   . Hepatitis C 08/16/2014   . Peripheral neuropathy 06/24/2013    Past Surgical History:  Procedure Laterality Date  . A/V FISTULAGRAM Right 07/07/2018   Procedure: A/V FISTULAGRAM;  Surgeon: Marty Heck, MD;  Location: [facility];  Service: Cardiovascular;  Laterality: Right;  . AMPUTATION Left 03/20/2017   Procedure: LEFT ABOVE KNEE AMPUTATION;  Surgeon: Newt Minion, MD;  Location: [facility];  Service: Orthopedics;  Laterality: Left;  . APPENDECTOMY    . AV FISTULA PLACEMENT Left 09/13/2014   Procedure: Brachial Artery to Brachial Vein Gortex Four - Seven Stretch GRAFT INSERTION Left Forearm;  Surgeon: Mal Misty, MD;  Location: [facility];  Service: Vascular;  Laterality: Left;  . BASCILIC VEIN TRANSPOSITION Right 10/26/2017   Procedure: BASILIC VEIN TRANSPOSITION FIRST STAGE RIGHT ARM;  Surgeon: Conrad Nichols, MD;  Location: [facility];  Service: Vascular;  Laterality: Right;  . BASCILIC VEIN TRANSPOSITION Right 02/01/2018   Procedure: SECOND STAGE BASILIC VEIN TRANSPOSITION RIGHT UPPER EXTREMITY;  Surgeon: Marty Heck, MD;  Location: [place] [facility];  Service: Vascular;  Laterality: Right;  . CHOLECYSTECTOMY OPEN    . COLON SURGERY    . COLONOSCOPY    . ENTEROSCOPY N/A 05/28/2020   Procedure: ENTEROSCOPY;  Surgeon: Mauri Pole, MD;  Location: [facility] ENDOSCOPY;  Service: Endoscopy;  Laterality: N/A;  . ENTEROSCOPY N/A 06/25/2020   Procedure: ENTEROSCOPY;  Surgeon: Milus Banister, MD;  Location: [facility] ENDOSCOPY;  Service: Gastroenterology;  Laterality: N/A;  . ESOPHAGOGASTRODUODENOSCOPY (EGD) WITH PROPOFOL N/A 05/14/2020   Procedure: ESOPHAGOGASTRODUODENOSCOPY (EGD) WITH PROPOFOL;  Surgeon: Mauri Pole, MD;  Location: [facility] ENDOSCOPY;  Service: Endoscopy;  Laterality: N/A;  with APC  . EXCISIONAL TOTAL KNEE ARTHROPLASTY WITH ANTIBIOTIC SPACERS Left 02/06/2017   Procedure: Incisional total iknee with antibiotic spacer ;  Surgeon: Leandrew Koyanagi, MD;  Location: [place];  Service: Orthopedics;  Laterality:  Left;  . GIVENS CAPSULE STUDY N/A 09/09/2020   Procedure: GIVENS CAPSULE STUDY;  Surgeon: Jackquline Denmark, MD;  Location: [facility] ENDOSCOPY;  Service: Endoscopy;  Laterality: N/A;  . HOT HEMOSTASIS N/A 05/14/2020   Procedure: HOT HEMOSTASIS (ARGON PLASMA COAGULATION/BICAP);  Surgeon: Mauri Pole, MD;  Location: [facility];  Service: Endoscopy;  Laterality: N/A;  . HOT HEMOSTASIS N/A 05/28/2020   Procedure: HOT HEMOSTASIS (ARGON PLASMA COAGULATION/BICAP);  Surgeon: Mauri Pole, MD;  Location: [facility] ENDOSCOPY;  Service: Endoscopy;  Laterality: N/A;  . HOT HEMOSTASIS N/A 06/25/2020   Procedure: HOT HEMOSTASIS (ARGON PLASMA COAGULATION/BICAP);  Surgeon: Milus Banister, MD;  Location: [facility] ENDOSCOPY;  Service: Gastroenterology;  Laterality: N/A;  . INTRAVASCULAR PRESSURE WIRE/FFR STUDY N/A 04/13/2020   Procedure: INTRAVASCULAR PRESSURE WIRE/FFR STUDY;  Surgeon: Burnell Blanks, MD;  Location: [place] [facility];  Service: Cardiovascular;  Laterality: N/A;  . IR FLUORO GUIDE CV LINE RIGHT  02/10/2017  . IR REMOVAL TUN CV CATH W/O FL  04/01/2017  . IR US GUIDE VASC ACCESS RIGHT  02/10/2017  . IRRIGATION AND  DEBRIDEMENT KNEE Left 03/12/2017   Procedure: IRRIGATION AND DEBRIDEMENT LEFT KNEE WITH WOUND VAC APPLICATION;  Surgeon: Leandrew Koyanagi, MD;  Location: [place];  Service: Orthopedics;  Laterality: Left;  . JOINT REPLACEMENT    . KNEE ARTHROSCOPY Right 08/10/2014   Procedure: ARTHROSCOPY I & D KNEE;  Surgeon: Marianna Payment, MD;  Location: [facility];  Service: Orthopedics;  Laterality: Right;  . KNEE ARTHROSCOPY Left 08/11/2014   Procedure: ARTHROSCOPIC WASHOUT LEFT KNEE;  Surgeon: Marianna Payment, MD;  Location: [place];  Service: Orthopedics;  Laterality: Left;  . KNEE ARTHROSCOPY Left 08/19/2014   Procedure: ARTHROSCOPIC WASHOUT LEFT KNEE;  Surgeon: Leandrew Koyanagi, MD;  Location: [facility];  Service: Orthopedics;  Laterality: Left;  . KNEE ARTHROSCOPY WITH LATERAL MENISECTOMY Left 04/04/2015    Procedure: AND PARTIAL LATERAL MENISECTOMY;  Surgeon: Leandrew Koyanagi, MD;  Location: [facility];  Service: Orthopedics;  Laterality: Left;  . KNEE ARTHROSCOPY WITH MEDIAL MENISECTOMY Left 04/04/2015   Procedure: LEFT KNEE ARTHROSCOPY WITH PARTIAL MEDIAL MENISCECTOMY  AND SYNOVECTOMY;  Surgeon: Leandrew Koyanagi, MD;  Location: [place];  Service: Orthopedics;  Laterality: Left;  . PERIPHERAL VASCULAR BALLOON ANGIOPLASTY  07/07/2018   Procedure: PERIPHERAL VASCULAR BALLOON ANGIOPLASTY;  Surgeon: Marty Heck, MD;  Location: [facility];  Service: Cardiovascular;;  right AV fistula  . POLYPECTOMY    . RIGHT/LEFT HEART CATH AND CORONARY ANGIOGRAPHY N/A 04/13/2020   Procedure: RIGHT/LEFT HEART CATH AND CORONARY ANGIOGRAPHY;  Surgeon: Burnell Blanks, MD;  Location: [facility] [facility];  Service: Cardiovascular;  Laterality: N/A;  . SHOULDER ARTHROSCOPY Bilateral 08/10/2014   Procedure: I & D BILATERAL SHOULDERS ;  Surgeon: Marianna Payment, MD;  Location: [facility];  Service: Orthopedics;  Laterality: Bilateral;  . SMALL INTESTINE SURGERY     Due to Small Bowel Obstruction; "fixed it when they did my gallbladder OR"  . TEE WITHOUT CARDIOVERSION N/A 08/14/2014   Procedure: TRANSESOPHAGEAL ECHOCARDIOGRAM (TEE);  Surgeon: Thayer Headings, MD;  Location: [place];  Service: Cardiovascular;  Laterality: N/A;  . TENOSYNOVECTOMY Right 08/11/2014   Procedure: RIGHT WRIST IRRIGATION AND DEBRIDEMENT, TENOSYNOVECTOMY;  Surgeon: Marianna Payment, MD;  Location: [place];  Service: Orthopedics;  Laterality: Right;  . TOTAL KNEE ARTHROPLASTY Left 11/07/2015   Procedure: LEFT TOTAL KNEE ARTHROPLASTY WITH REVISION OF IMPLANTS;  Surgeon: Leandrew Koyanagi, MD;  Location: [place];  Service: Orthopedics;  Laterality: Left;  . TUBAL LIGATION       OB History    Gravida  5   Para  0   Term  0   Preterm      AB  2   Living  3     SAB  1   IAB  1   Ectopic       Multiple      Live Births              Family History  Problem Relation Age of Onset  . Uterine cancer Mother   . Heart disease Father   . Hypertension Sister   . Uterine cancer Sister   . Cancer Brother        unsure type  . Diabetes Brother   . Hypertension Brother   . Colon cancer Neg Hx   . Colon polyps Neg Hx   . Esophageal cancer Neg Hx   . Rectal cancer Neg Hx   . Stomach cancer Neg Hx  Social History   Tobacco Use  . Smoking status: Current Every Day Smoker    Packs/day: 0.50    Years: 45.00    Pack years: 22.50    Types: Cigarettes  . Smokeless tobacco: Never Used  Vaping Use  . Vaping Use: Never used  Substance Use Topics  . Alcohol use: Not Currently    Alcohol/week: 0.0 standard drinks    Comment: 03/10/2017 "I drink a wine cooler a few times/year"  . Drug use: No    Home Medications Prior to Admission medications   Medication Sig Start Date End Date Taking? Authorizing Provider  Accu-Chek Softclix Lancets lancets Check sugars three times a day for E11.65 09/20/18   [name], MD  acetaminophen (TYLENOL) 500 MG tablet Take 1,000 mg by mouth every 6 (six) hours as needed (for pain).    [provider]  ascorbic acid (VITAMIN C) 500 MG tablet Take 1 tablet (500 mg total) by mouth daily. 06/25/20   Samuella Cota, MD  atorvastatin (LIPITOR) 10 MG tablet Take 1 tablet (10 mg total) by mouth at bedtime. 06/13/20   [name], MD  blood glucose meter kit and supplies KIT Dispense based on patient and insurance preference. Use up to four times daily as directed 10/13/19   [name], MD  Blood Glucose Monitoring Suppl (ACCU-CHEK GUIDE) w/Device KIT 1 Device by Does not apply route daily. 10/31/19   [name], MD  calcium acetate (PHOSLO) 667 MG capsule Take 1,334-2,001 mg by mouth See admin instructions. Take 2,001 mg by mouth in the morning with breakfast, 1,334 mg with lunch (IF consumed), and 2,001 mg with dinner/evening  meal 01/13/19   [provider]  diltiazem 2 % GEL Apply 1 application topically 3 (three) times daily. Apply to affected area on anus Patient taking differently: Apply 1 application topically 3 (three) times daily as needed (for irritation on/in the anus). 06/25/20   Samuella Cota, MD  diphenhydrAMINE (BENADRYL) 25 mg capsule Take 25 mg by mouth every 6 (six) hours as needed for allergies.    [provider]  famotidine (PEPCID) 40 MG tablet Take 1 tablet (40 mg total) by mouth daily. 08/24/20   Noralyn Pick, NP  gabapentin (NEURONTIN) 100 MG capsule Take 200 mg by mouth See admin instructions. Take 2 capsules (200 mg) by mouth 3 times on Sundays, Mondays, Wednesdays, & Fridays Patient not taking: No sig reported 11/01/18   [provider]  gabapentin (NEURONTIN) 300 MG capsule Take 300 mg by mouth See admin instructions. Take 300 mg by mouth with lunch on Tuesdays, Thursdays, & Saturdays Patient not taking: No sig reported    [provider]  glucose blood (ACCU-CHEK GUIDE) test strip Check blood sugar up to 4 times a day 10/20/19   [name], MD  hydrALAZINE (APRESOLINE) 100 MG tablet Take 100 mg by mouth See admin instructions. Take 100 mg by mouth three times a day on Sun/Mon/Wed/Fri (NON-DIALYSIS DAYS)    [provider]  HYDROcodone-acetaminophen (NORCO) 5-325 MG tablet Take 1 tablet by mouth every 6 (six) hours as needed for moderate pain. 10/31/20   [name], MD  hydrocortisone (ANUSOL-HC) 25 MG suppository Place 1 suppository (25 mg total) rectally 2 (two) times daily. Patient taking differently: Place 25 mg rectally 2 (two) times daily as needed for hemorrhoids or anal itching. 06/25/20   Samuella Cota, MD  Iron, Ferrous Sulfate, 325 (65 Fe) MG TABS Take  325 mg by mouth daily. Note that this medication will turn stool black and may cause constipation. 06/25/20   Samuella Cota, MD  isosorbide mononitrate (IMDUR) 30  MG 24 hr tablet Take 1 tablet (30 mg total) by mouth daily. 07/16/20 10/14/20  Donato Heinz, MD  Lancet Devices [facility]) lancets Check sugars TID for E11.65 01/29/15   Lance Bosch, NP  lidocaine-prilocaine (EMLA) cream Apply 1 application topically Every Tuesday,Thursday,and Saturday with dialysis. On days of dialysis 3x week 10/25/18   [provider]  methocarbamol (ROBAXIN) 500 MG tablet Take 500 mg by mouth every 8 (eight) hours as needed for muscle spasms. 06/08/19   [provider]  metoprolol succinate (TOPROL XL) 25 MG 24 hr tablet Take 1 tablet (25 mg total) by mouth daily. 10/31/20   Donato Heinz, MD  misoprostol (CYTOTEC) 100 MCG tablet TAKE 1 TABLET BY MOUTH EVERY 6 HOURS 10/29/20   Fayrene Helper, MD  multivitamin (RENA-VIT) TABS tablet Take 1 tablet by mouth in the morning. 03/01/19   [provider]  nitroGLYCERIN (NITROSTAT) 0.4 MG SL tablet Place 1 tablet (0.4 mg total) under the tongue every 5 (five) minutes as needed. Patient taking differently: Place 0.4 mg under the tongue every 5 (five) minutes as needed for chest pain. 04/25/20 07/24/20  Donato Heinz, MD  nortriptyline (PAMELOR) 10 MG capsule Take 3 capsules (30 mg total) by mouth at bedtime. 10/29/20   Tomi Likens, Adam R, DO  pantoprazole (PROTONIX) 40 MG tablet Take 1 tablet (40 mg total) by mouth daily. 40 mg BID for 2 months & then once daily Patient not taking: No sig reported 08/03/20 10/02/20  [name], MD  polyethylene glycol (MIRALAX / GLYCOLAX) 17 g packet Take 17 g by mouth daily as needed for moderate constipation. Patient taking differently: Take 17 g by mouth daily as needed for moderate constipation (MIX AND DRINK). 12/10/18   [name], MD  UNABLE TO FIND Prosthetic socket, from [name] 08/03/20   [name], MD  traZODone (DESYREL) 50 MG tablet Take 50 mg by mouth at bedtime as needed for sleep. Patient not taking: Reported on  10/01/2020 10/27/18   [provider]    Allergies    Compazine [prochlorperazine], Shellfish-derived products, Iodinated diagnostic agents, Omnipaque [iohexol], Tape, and Sulfa antibiotics  Review of Systems   Review of Systems  All other systems reviewed and are negative.   Physical Exam Updated Vital Signs BP 109/72   Pulse 64   Temp 97.6 F (36.4 C) (Oral)   Resp 17   LMP 03/16/2002   SpO2 91%   Physical Exam Vitals and nursing note reviewed.  Constitutional:      Appearance: Normal appearance.  HENT:     Head: Normocephalic.     Right Ear: External ear normal.     Left Ear: External ear normal.     Nose: Nose normal.     Mouth/Throat:     Pharynx: Oropharynx is clear.  Eyes:     Pupils: Pupils are equal, round, and reactive to light.  Cardiovascular:     Rate and Rhythm: Normal rate and regular rhythm.     Pulses: Normal pulses.  Pulmonary:     Effort: Pulmonary effort is normal.  Abdominal:     General: Abdomen is flat.  Musculoskeletal:     Cervical back: Normal range of motion.     Comments: Right lower extremity without point tenderness, deformity, and  full arom lle with aka, some mild diffuse enlargement relative to righ tside.  Mild dehiscence without discharge.  No redness, fluctuance or warmth palpated, but diffuse ttp with any touch  Neurological:     Mental Status: She is alert.     ED Results / Procedures / Treatments   Labs (all labs ordered are listed, but only abnormal results are displayed) Labs Reviewed  CBC WITH DIFFERENTIAL/PLATELET - Abnormal; Notable for the following components:      Result Value   RBC 3.54 (*)    Hemoglobin 9.9 (*)    HCT 30.2 (*)    RDW 17.9 (*)    Neutro Abs 9.6 (*)    Lymphs Abs 0.2 (*)    All other components within normal limits  BASIC METABOLIC PANEL - Abnormal; Notable for the following components:   Sodium 134 (*)    Chloride 95 (*)    Glucose, Bld 110 (*)    BUN 41 (*)    Creatinine, Ser  5.97 (*)    Calcium 6.9 (*)    GFR, Estimated 7 (*)    All other components within normal limits    EKG None  Radiology DG Knee Complete 4 Views Right  Result Date: 11/08/2020 CLINICAL DATA:  Fall with right knee pain. EXAM: RIGHT KNEE - COMPLETE 4+ VIEW COMPARISON:  None. FINDINGS: No fracture or dislocation. Medial tibiofemoral joint space narrowing. Mild tricompartmental peripheral spurring. There is subchondral cystic change in the patellofemoral compartment. Small quadriceps and patellar tendon enthesophytes. No joint effusion. Advanced vascular calcifications particularly for age. Mild medial soft tissue edema. IMPRESSION: 1. No acute fracture or dislocation. 2. 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 3. Advanced vascular calcifications, particularly for age. Electronically Signed   By: Keith Rake M.D.   On: 11/08/2020 15:32   DG Femur 1 View Left  Result Date: 11/08/2020 CLINICAL DATA:  Fall with left femur pain. EXAM: LEFT FEMUR 1 VIEW COMPARISON:  None. FINDINGS: Remote mid femoral amputation. Mild heterotopic calcification at the amputation site. The resection margin is smooth. No erosion or bony destruction. No evidence of fracture. Femoral head is well seated. Pubic rami are intact. Advanced vascular calcifications. Mild skin irregularity at the distal stump. IMPRESSION: 1. No acute fracture of the left femur. Remote mid femoral amputation. 2. Mild skin irregularity at the distal stump. Electronically Signed   By: Keith Rake M.D.   On: 11/08/2020 15:32    Procedures Procedures   Medications Ordered in ED Medications  oxyCODONE-acetaminophen (PERCOCET/ROXICET) 5-325 MG per tablet 1 tablet (has no administration in time range)    ED Course  I have reviewed the triage vital signs and the nursing notes.  Pertinent labs & imaging results that were available during my care of the patient were reviewed by me and considered in  my medical decision making (see chart for details).    MDM Rules/Calculators/A&P                          1-Wound/stump breakdown with pain-no evidence of acute infection at this time with no erythema, induration, fluctuance, or acute abnormality noted on her x-Alexcis Bicking.  Patient has recently been on doxycycline.  We will restart the doxycycline.  Discussed patient's care with [facility] Persons, PA who is on-call for Dr. Sharol Given.  She will see the patient in follow-up in the office tomorrow.  Discussed return precautions with patient and she voices understanding 2- end-stage renal disease  patient has not been dialysis for the past 3 weeks.  She has some increased markings on her chest x-Kayin Osment but is not dyspneic and her sats are normal.  Her electrolytes are within normal limit.  We discussed that she needs to go to dialysis first thing tomorrow morning.  She advises that she knows how to get in.  On discussion with her daughter she has that we facilitate this.  However, I am unable to reach the renal navigator at this time and she is advised to call first thing in the morning. Final Clinical Impression(s) / ED Diagnoses Final diagnoses:  Ulceration of stump of above knee amputation of left lower extremity, limited to breakdown of skin ([place])  ESRD (end stage renal disease) ([place])    Rx / DC Orders ED Discharge Orders    None       Pattricia Boss, MD 11/08/20 1704

## 2020-11-08 NOTE — Telephone Encounter (Signed)
[facility] ER called and states that pt has a wound on her stump and advised that we will call the pt tomorrow and make an appt for her to come to the office for eval.

## 2020-11-08 NOTE — ED Provider Notes (Signed)
Emergency Medicine Provider Triage Evaluation Note  Brandy Houston , a 65 y.o. female  was evaluated in triage.  Pt with multiple complaints. She is c/o pain to her left aka with concern for wound infection. Also c/o right knee pain after a fall 1 week ago. Also c/o mouth pain. Pt last had dialysis on Thursday of last week. She denies sob.  Review of Systems  Positive: Wound infection, right knee pain, mouth pain Negative: sob  Physical Exam  BP 105/66 (BP Location: Right Arm)   Pulse 82   Resp 15   LMP 03/16/2002   SpO2 100%  Gen:   Awake, no distress   Resp:  Normal effort  MSK:   Moves extremities without difficulty  Other:  Open wound to left aka, old wounds in various stages of healing to the right knee, orange film noted inside the mouth  Medical Decision Making  Medically screening exam initiated at 2:10 PM.  Appropriate orders placed.  Brandy Houston was informed that the remainder of the evaluation will be completed by another provider, this initial triage assessment does not replace that evaluation, and the importance of remaining in the ED until their evaluation is complete.    Bishop Dublin 11/08/20 1410    Carmin Muskrat, MD 11/08/20 1538

## 2020-11-08 NOTE — ED Triage Notes (Signed)
Pt has multiple complaints. Reports having wound infection to left leg that she has been taking antibiotics for but reports not getting better. Has pain to both right leg from a recent fall and left stump. Reports having pain to her mouth and difficulty eating. Dialysis pt, last treatment was Thursday one week ago.

## 2020-11-08 NOTE — Discharge Instructions (Signed)
Please call dialysis center first thing in the morning and have your dialysis tomorrow. Dr. Jess Barters office is to contact you tomorrow for recheck tomorrow afternoon Return immediately to the emergency department if you start running a fever, have redness, streaking, or worsening discharge from the left stump area

## 2020-11-08 NOTE — ED Notes (Signed)
Pt was not in x-ray, pt found sitting outside in her wheel chair

## 2020-11-08 NOTE — ED Notes (Signed)
Patient transported to X-ray 

## 2020-11-09 ENCOUNTER — Telehealth: Payer: Self-pay

## 2020-11-09 ENCOUNTER — Ambulatory Visit (INDEPENDENT_AMBULATORY_CARE_PROVIDER_SITE_OTHER): Payer: Medicare Other | Admitting: Physician Assistant

## 2020-11-09 ENCOUNTER — Encounter: Payer: Self-pay | Admitting: Physician Assistant

## 2020-11-09 ENCOUNTER — Other Ambulatory Visit: Payer: Self-pay

## 2020-11-09 DIAGNOSIS — S78119A Complete traumatic amputation at level between unspecified hip and knee, initial encounter: Secondary | ICD-10-CM

## 2020-11-09 DIAGNOSIS — Z89612 Acquired absence of left leg above knee: Secondary | ICD-10-CM

## 2020-11-09 NOTE — Telephone Encounter (Signed)
FYI

## 2020-11-09 NOTE — Telephone Encounter (Signed)
Reviewed all notes from ER and ortho.  Did she go to dialysis??  Needs to get in with the renal nutritionist.   Ortho reviewed the stump. It is stable, tender but no other findings noted.  Needs ov with me in near future.  Can't skip dialysis. This is likely partly why she is feeling worse.

## 2020-11-09 NOTE — Telephone Encounter (Signed)
I called and checked with pt's daughter to make sure that they were still coming to the appt today. Pt's daughter is very upset because her mother does not want to get out of bed. She said that she will get dressed and come in an hour. I advised pt to come as she is its perfectly fine  no need to get ready we just want to see her. She can get in the car now and come to the office. Voiced understanding and said she will come.

## 2020-11-09 NOTE — Telephone Encounter (Signed)
Patient's daughter is calling in stating that her mom went to the hospital last night and was prescribed antibiotics with Dr.Andy -finished those and went to the hospital yesterday to be prescribed more. Daughter states that she would like a call in return because Brandy Houston has been lethargic, and malnourished looking. Brandy Houston would like a phone call in return to know what to do because her stump is swollen, looks worse and has not been able to really eat anything. Sent patient to triage as no open appointments anywhere.

## 2020-11-09 NOTE — Telephone Encounter (Signed)
Left voicemail for patients daughter to return call.

## 2020-11-09 NOTE — Telephone Encounter (Signed)
Called back and pt will come in at 1 pm today.

## 2020-11-09 NOTE — Progress Notes (Signed)
Contacted [facility] while pt in office they will accept new pt referral. Advised that they will contact the pt for start of care the later part of next week. Order written and faxed for [facility] to assist with ADLs, HHPT for transfers, mobility training, home safety, HHRN for wound/ skin assessment, teach pt and care giver to apply Silvadene and dressing to skin tear HH aide to assist with personal care and a [place] social worker to assist with community based programs and resources. Faxed dictation from office visit today. Patient's daughter is very please with this. She does live about 1.5 hours away from her mother and is trying to balance her time between taking care of mom and also her wife recently had a crush injury to her foot and is taking care of her as well. Advised to call with any questions. Both patient and daughter are very excited services have been approved.   Arina Torry, RMA,CWCA

## 2020-11-09 NOTE — Telephone Encounter (Signed)
I called pt's daughter [phone] she is coming from [place] and is about an hour and a half away. She is trying to coordinate dialysis for her mother and states tht if she is not able to get her in today then she will bring her straight here. If she is able to get mom in then she said she will have to make arrangements for another time. She is driving right now and asked if I can call her back in about 30 min to see what she is able to arrange. I advised that I would absolutely do this. Will hold message

## 2020-11-09 NOTE — Telephone Encounter (Signed)
Called pt and lm on vm to advise would like to make appt for her to come in the office today advised to ask for me and I will schedule for her. I will hold this message and try again later.

## 2020-11-09 NOTE — Telephone Encounter (Signed)
Initial Comment Caller states patient has an infection on her leg. She states she is lethargic and not able to eat anything. She states she was on antibiotics. She states she was seen in the hospital yesterday. She states they would restart the antibiotics. Translation No Disp. Time Eilene Ghazi Time) Disposition Final User 11/09/2020 1:36:26 PM Attempt made - message left Oren Bracket 11/09/2020 1:59:33 PM Clinical Call Yes Hardin Negus, RN, [place]

## 2020-11-09 NOTE — Telephone Encounter (Signed)
Patient was sent to triage

## 2020-11-09 NOTE — Telephone Encounter (Signed)
Daughter is returning a call from [place]. Reviewed questions below, daughter states that Brandy Houston has no been to dialysis since to last Saturday but has an appointment tomorrow. Brandy Houston says that she has been trying to get her to go but Brandy Houston has been way to weak to go anywhere. Has not been able to get up to go to the bathroom, unable to eat any solids. Daughter scheduled an appointment for 6/8 to discuss concerns.

## 2020-11-09 NOTE — Progress Notes (Signed)
Office Visit Note   Patient: Brandy Houston           Date of Birth: 06-06-1956           MRN: [id number] Visit Date: 11/09/2020              Requested by: [name], [place],  [place] [address] PCP: [name], MD  No chief complaint on file.     HPI: Patient presents today.  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  She was seen in the emergency room yesterday at.  She had a fall onto her amputation stump.  This caused some wound dehiscence.  She was seen and evaluated in the emergency room yesterday she did not have any white count she did not have any evidence of infection at the stump site.  Precautionary she was placed on doxycycline.  Her daughter accompanies her.  She has been in touch with her primary care because she is not eating and is not being compliant with going to dialysis.  She has lost about 35 pounds since Christmas per the daughter.  Patient is wondering about home health care.  Assessment & Plan: Visit Diagnoses: No diagnosis found.  Plan: Patient will follow up in 2 weeks we will try to arrange home care and PT OT evaluation.  Have also asked that they finish the doxycycline but get a probiotic.  Reiterated the importance of protein and protein shakes she already is taking a vitamin  Follow-Up Instructions: No follow-ups on file.   Ortho Exam  Patient is alert, oriented, no adenopathy, well-dressed, normal affect, normal respiratory effort. Cachectic appearing woman.  Left above-knee amputation stump site superficial wound dehiscence.  Does not probe deeply no ascending cellulitis  Imaging: DG Chest 1 View  Result Date: 11/08/2020 CLINICAL DATA:  Post fall.  Wound infection. EXAM: CHEST  1 VIEW COMPARISON:  Radiograph 09/22/2020 FINDINGS: Chronic cardiomegaly. Aortic atherosclerosis. Hilar prominence likely due to overlapping vascular structures. Small left pleural effusion and  basilar opacity, slight increase from prior. Suspected trace right pleural effusion, unchanged. Similar interstitial thickening suspicious for pulmonary edema. No pneumothorax. No acute osseous abnormalities are seen. IMPRESSION: 1. Cardiomegaly with pulmonary edema, similar to exam last month. 2. Small left pleural effusion and basilar opacity, slight increase from prior. Trace right pleural effusion is unchanged. Electronically Signed   By: Keith Rake M.D.   On: 11/08/2020 15:35   DG Knee Complete 4 Views Right  Result Date: 11/08/2020 CLINICAL DATA:  Fall with right knee pain. EXAM: RIGHT KNEE - COMPLETE 4+ VIEW COMPARISON:  None. FINDINGS: No fracture or dislocation. Medial tibiofemoral joint space narrowing. Mild tricompartmental peripheral spurring. There is subchondral cystic change in the patellofemoral compartment. Small quadriceps and patellar tendon enthesophytes. No joint effusion. Advanced vascular calcifications particularly for age. Mild medial soft tissue edema. IMPRESSION: 1. No acute fracture or dislocation. 2. 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 3. Advanced vascular calcifications, particularly for age. Electronically Signed   By: Keith Rake M.D.   On: 11/08/2020 15:32   DG Femur 1 View Left  Result Date: 11/08/2020 CLINICAL DATA:  Fall with left femur pain. EXAM: LEFT FEMUR 1 VIEW COMPARISON:  None. FINDINGS: Remote mid femoral amputation. Mild heterotopic calcification at the amputation site. The resection margin is smooth. No erosion or bony destruction. No evidence of fracture. Femoral head is well seated. Pubic rami are intact.  Advanced vascular calcifications. Mild skin irregularity at the distal stump. IMPRESSION: 1. No acute fracture of the left femur. Remote mid femoral amputation. 2. Mild skin irregularity at the distal stump. Electronically Signed   By: Keith Rake M.D.   On: 11/08/2020 15:32   No images are  attached to the encounter.  Labs: Lab Results  Component Value Date   HGBA1C 4.8 06/21/2020   HGBA1C 4.6 (L) 04/14/2020   HGBA1C 4.8 10/31/2019   ESRSEDRATE 99 (H) 07/21/2017   ESRSEDRATE 102 (H) 10/29/2015   ESRSEDRATE 120 (H) 08/09/2014   CRP 11.1 (H) 07/21/2017   CRP 1.9 (H) 03/12/2017   CRP 1.4 (H) 10/29/2015   REPTSTATUS 02/10/2017 FINAL 02/08/2017   GRAMSTAIN  02/06/2017    FEW WBC PRESENT, PREDOMINANTLY MONONUCLEAR NO ORGANISMS SEEN    CULT >=100,000 COLONIES/mL YEAST (A) 02/08/2017   LABORGA NO GROWTH 02/03/2017     Lab Results  Component Value Date   ALBUMIN 2.3 (L) 09/22/2020   ALBUMIN 2.0 (L) 09/11/2020   ALBUMIN 3.1 (L) 08/24/2020    Lab Results  Component Value Date   MG 2.1 05/28/2020   MG 2.7 (H) 06/05/2019   MG 2.6 (H) 11/27/2018   No results found for: VD25OH  No results found for: PREALBUMIN CBC EXTENDED Latest Ref Rng & Units 11/08/2020 09/22/2020 09/11/2020  WBC 4.0 - 10.5 K/uL 10.1 6.3 7.3  RBC 3.87 - 5.11 MIL/uL 3.54(L) 2.88(L) 2.55(L)  HGB 12.0 - 15.0 g/dL 9.9(L) 8.8(L) 7.4(L)  HCT 36.0 - 46.0 % 30.2(L) 29.1(L) 23.7(L)  PLT 150 - 400 K/uL 193 212 182  NEUTROABS 1.7 - 7.7 K/uL 9.6(H) - -  LYMPHSABS 0.7 - 4.0 K/uL 0.2(L) - -     There is no height or weight on file to calculate BMI.  Orders:  No orders of the defined types were placed in this encounter.  No orders of the defined types were placed in this encounter.    Procedures: No procedures performed  Clinical Data: No additional findings.  ROS:  All other systems negative, except as noted in the HPI. Review of Systems  Objective: Vital Signs: LMP 03/16/2002   Specialty Comments:  No specialty comments available.  PMFS History: Patient Active Problem List   Diagnosis Date Noted  . Acute upper GI bleeding 09/08/2020  . Dyslipidemia 09/08/2020  . Esophageal candidiasis ([place]) 06/25/2020  . Intestinal angiodysplasia   . GIB (gastrointestinal bleeding) 06/21/2020  .  Lab test positive for detection of COVID-19 virus 06/21/2020  . Rectal bleeding 06/20/2020  . Coronary artery disease of native artery of native heart with stable angina pectoris ([place]) 06/13/2020  . Nonrheumatic aortic valve insufficiency 06/13/2020  . Nonrheumatic tricuspid valve regurgitation 06/13/2020  . GAVE (gastric antral vascular ectasia)   . AVM (arteriovenous malformation) of small bowel, acquired with hemorrhage   . Hip swelling, right 05/27/2020  . Hypokalemia 05/27/2020  . Anemia of chronic renal failure 05/27/2020  . 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 Acute combined systolic and diastolic heart failure ([place])   . Unstable angina ([place])   . Symptomatic anemia 01/27/2020  . Hypercalcemia 10/06/2019  . Moderate protein-calorie malnutrition ([place]) 05/11/2018  . End stage renal disease ([place]) 04/19/2018  . Iron deficiency anemia, unspecified 04/19/2018  . Nephrotic syndrome with unspecified morphologic changes 04/19/2018  . Secondary hyperparathyroidism of renal origin ([place]) 04/19/2018  . Nicotine dependence, cigarettes, uncomplicated 28/41/3244  . Essential hypertension   . Amputation above knee ([place])   .  ABLA (acute blood loss anemia)   . DM type 2 causing ESRD ([place]) 03/21/2017  . Anemia in chronic kidney disease 02/05/2017  . Chronic diastolic CHF (congestive heart failure) ([place]) 11/08/2015  . Diabetic peripheral neuropathy ([place])   . Major depression, recurrent, chronic ([place])   . Chronic kidney disease (CKD), stage IV (severe) ([place])   . Hepatitis C 08/16/2014  . Peripheral neuropathy 06/24/2013   Past Medical History:  Diagnosis Date  . Allergy   . Anemia   . Anxiety   . Arthritis    "in my joints" (03/10/2017)  . Chronic diastolic CHF (congestive heart failure) ([place])   . Depression    Chronic  . Diabetic peripheral neuropathy ([place])   . Dysrhythmia    tachycardia, normal ECHO 08-09-14  . ESRD (end stage renal disease) ([place])   .  GERD (gastroesophageal reflux disease)   . Heart murmur     dx'd 02/05/2017  . Hepatitis C    "tx'd in 2016; I'm negative now" (02/05/2017)  . History of blood transfusion 2017   "w/knee replacement"  . Hyperlipidemia   . Hypertension   . Peripheral neuropathy   . Protein calorie malnutrition ([place])   . Septic arthritis ([place])   . Slow transit constipation   . Symptomatic anemia 01/27/2020  . Type II diabetes mellitus ([name])    IDDM  . Uncontrolled hypertension 02/18/2013    Family History  Problem Relation Age of Onset  . Uterine cancer Mother   . Heart disease Father   . Hypertension Sister   . Uterine cancer Sister   . Cancer Brother        unsure type  . Diabetes Brother   . Hypertension Brother   . Colon cancer Neg Hx   . Colon polyps Neg Hx   . Esophageal cancer Neg Hx   . Rectal cancer Neg Hx   . Stomach cancer Neg Hx     Past Surgical History:  Procedure Laterality Date  . A/V FISTULAGRAM Right 07/07/2018   Procedure: A/V FISTULAGRAM;  Surgeon: Marty Heck, MD;  Location: [facility];  Service: Cardiovascular;  Laterality: Right;  . AMPUTATION Left 03/20/2017   Procedure: LEFT ABOVE KNEE AMPUTATION;  Surgeon: Newt Minion, MD;  Location: [facility];  Service: Orthopedics;  Laterality: Left;  . APPENDECTOMY    . AV FISTULA PLACEMENT Left 09/13/2014   Procedure: Brachial Artery to Brachial Vein Gortex Four - Seven Stretch GRAFT INSERTION Left Forearm;  Surgeon: Mal Misty, MD;  Location: [facility];  Service: Vascular;  Laterality: Left;  . BASCILIC VEIN TRANSPOSITION Right 10/26/2017   Procedure: BASILIC VEIN TRANSPOSITION FIRST STAGE RIGHT ARM;  Surgeon: Conrad Waterville, MD;  Location: [facility];  Service: Vascular;  Laterality: Right;  . BASCILIC VEIN TRANSPOSITION Right 02/01/2018   Procedure: SECOND STAGE BASILIC VEIN TRANSPOSITION RIGHT UPPER EXTREMITY;  Surgeon: Marty Heck, MD;  Location: [place];  Service: Vascular;  Laterality: Right;  . CHOLECYSTECTOMY  OPEN    . COLON SURGERY    . COLONOSCOPY    . ENTEROSCOPY N/A 05/28/2020   Procedure: ENTEROSCOPY;  Surgeon: Mauri Pole, MD;  Location: [facility] ENDOSCOPY;  Service: Endoscopy;  Laterality: N/A;  . ENTEROSCOPY N/A 06/25/2020   Procedure: ENTEROSCOPY;  Surgeon: Milus Banister, MD;  Location: [facility] ENDOSCOPY;  Service: Gastroenterology;  Laterality: N/A;  . ESOPHAGOGASTRODUODENOSCOPY (EGD) WITH PROPOFOL N/A 05/14/2020   Procedure: ESOPHAGOGASTRODUODENOSCOPY (EGD) WITH PROPOFOL;  Surgeon: Mauri Pole, MD;  Location:  [facility] ENDOSCOPY;  Service: Endoscopy;  Laterality: N/A;  with APC  . EXCISIONAL TOTAL KNEE ARTHROPLASTY WITH ANTIBIOTIC SPACERS Left 02/06/2017   Procedure: Incisional total iknee with antibiotic spacer ;  Surgeon: Leandrew Koyanagi, MD;  Location: [place];  Service: Orthopedics;  Laterality: Left;  . GIVENS CAPSULE STUDY N/A 09/09/2020   Procedure: GIVENS CAPSULE STUDY;  Surgeon: Jackquline Denmark, MD;  Location: [facility] ENDOSCOPY;  Service: Endoscopy;  Laterality: N/A;  . HOT HEMOSTASIS N/A 05/14/2020   Procedure: HOT HEMOSTASIS (ARGON PLASMA COAGULATION/BICAP);  Surgeon: Mauri Pole, MD;  Location: [facility];  Service: Endoscopy;  Laterality: N/A;  . HOT HEMOSTASIS N/A 05/28/2020   Procedure: HOT HEMOSTASIS (ARGON PLASMA COAGULATION/BICAP);  Surgeon: Mauri Pole, MD;  Location: [facility] ENDOSCOPY;  Service: Endoscopy;  Laterality: N/A;  . HOT HEMOSTASIS N/A 06/25/2020   Procedure: HOT HEMOSTASIS (ARGON PLASMA COAGULATION/BICAP);  Surgeon: Milus Banister, MD;  Location: [facility] ENDOSCOPY;  Service: Gastroenterology;  Laterality: N/A;  . INTRAVASCULAR PRESSURE WIRE/FFR STUDY N/A 04/13/2020   Procedure: INTRAVASCULAR PRESSURE WIRE/FFR STUDY;  Surgeon: Burnell Blanks, MD;  Location: [place] [facility];  Service: Cardiovascular;  Laterality: N/A;  . IR FLUORO GUIDE CV LINE RIGHT  02/10/2017  . IR REMOVAL TUN CV CATH W/O FL  04/01/2017  . IR US GUIDE VASC ACCESS RIGHT  02/10/2017   . IRRIGATION AND DEBRIDEMENT KNEE Left 03/12/2017   Procedure: IRRIGATION AND DEBRIDEMENT LEFT KNEE WITH WOUND VAC APPLICATION;  Surgeon: Leandrew Koyanagi, MD;  Location: [facility];  Service: Orthopedics;  Laterality: Left;  . JOINT REPLACEMENT    . KNEE ARTHROSCOPY Right 08/10/2014   Procedure: ARTHROSCOPY I & D KNEE;  Surgeon: Marianna Payment, MD;  Location: [facility];  Service: Orthopedics;  Laterality: Right;  . KNEE ARTHROSCOPY Left 08/11/2014   Procedure: ARTHROSCOPIC WASHOUT LEFT KNEE;  Surgeon: Marianna Payment, MD;  Location: [place];  Service: Orthopedics;  Laterality: Left;  . KNEE ARTHROSCOPY Left 08/19/2014   Procedure: ARTHROSCOPIC WASHOUT LEFT KNEE;  Surgeon: Leandrew Koyanagi, MD;  Location: [place];  Service: Orthopedics;  Laterality: Left;  . KNEE ARTHROSCOPY WITH LATERAL MENISECTOMY Left 04/04/2015   Procedure: AND PARTIAL LATERAL MENISECTOMY;  Surgeon: Leandrew Koyanagi, MD;  Location: [facility];  Service: Orthopedics;  Laterality: Left;  . KNEE ARTHROSCOPY WITH MEDIAL MENISECTOMY Left 04/04/2015   Procedure: LEFT KNEE ARTHROSCOPY WITH PARTIAL MEDIAL MENISCECTOMY  AND SYNOVECTOMY;  Surgeon: Leandrew Koyanagi, MD;  Location: [place];  Service: Orthopedics;  Laterality: Left;  . PERIPHERAL VASCULAR BALLOON ANGIOPLASTY  07/07/2018   Procedure: PERIPHERAL VASCULAR BALLOON ANGIOPLASTY;  Surgeon: Marty Heck, MD;  Location: [place] [facility];  Service: Cardiovascular;;  right AV fistula  . POLYPECTOMY    . RIGHT/LEFT HEART CATH AND CORONARY ANGIOGRAPHY N/A 04/13/2020   Procedure: RIGHT/LEFT HEART CATH AND CORONARY ANGIOGRAPHY;  Surgeon: Burnell Blanks, MD;  Location: [place] [facility];  Service: Cardiovascular;  Laterality: N/A;  . SHOULDER ARTHROSCOPY Bilateral 08/10/2014   Procedure: I & D BILATERAL SHOULDERS ;  Surgeon: Marianna Payment, MD;  [facility]: [facility];  Service: Orthopedics;  Laterality: Bilateral;  . SMALL INTESTINE SURGERY     Due to Small  Bowel Obstruction; "fixed it when they did my gallbladder OR"  . TEE WITHOUT CARDIOVERSION N/A 08/14/2014   Procedure: TRANSESOPHAGEAL ECHOCARDIOGRAM (TEE);  Surgeon: Thayer Headings, MD;  Location: [facility];  Service: Cardiovascular;  Laterality: N/A;  . TENOSYNOVECTOMY Right 08/11/2014  Procedure: RIGHT WRIST IRRIGATION AND DEBRIDEMENT, TENOSYNOVECTOMY;  Surgeon: Marianna Payment, MD;  Location: [place];  Service: Orthopedics;  Laterality: Right;  . TOTAL KNEE ARTHROPLASTY Left 11/07/2015   Procedure: LEFT TOTAL KNEE ARTHROPLASTY WITH REVISION OF IMPLANTS;  Surgeon: Leandrew Koyanagi, MD;  Location: [facility];  Service: Orthopedics;  Laterality: Left;  . TUBAL LIGATION     Social History   Occupational History  . Occupation: disabled  Tobacco Use  . Smoking status: Current Every Day Smoker    Packs/day: 0.50    Years: 45.00    Pack years: 22.50    Types: Cigarettes  . Smokeless tobacco: Never Used  Vaping Use  . Vaping Use: Never used  Substance and Sexual Activity  . Alcohol use: Not Currently    Alcohol/week: 0.0 standard drinks    Comment: 03/10/2017 "I drink a wine cooler a few times/year"  . Drug use: No  . Sexual activity: Not Currently

## 2020-11-10 DIAGNOSIS — N2581 Secondary hyperparathyroidism of renal origin: Secondary | ICD-10-CM | POA: Diagnosis not present

## 2020-11-10 DIAGNOSIS — D631 Anemia in chronic kidney disease: Secondary | ICD-10-CM | POA: Diagnosis not present

## 2020-11-10 DIAGNOSIS — Z992 Dependence on renal dialysis: Secondary | ICD-10-CM | POA: Diagnosis not present

## 2020-11-10 DIAGNOSIS — N186 End stage renal disease: Secondary | ICD-10-CM | POA: Diagnosis not present

## 2020-11-13 ENCOUNTER — Telehealth: Payer: Self-pay

## 2020-11-13 ENCOUNTER — Other Ambulatory Visit: Payer: Self-pay

## 2020-11-13 ENCOUNTER — Emergency Department (HOSPITAL_COMMUNITY): Payer: Medicare Other

## 2020-11-13 ENCOUNTER — Inpatient Hospital Stay (HOSPITAL_COMMUNITY)
Admission: EM | Admit: 2020-11-13 | Discharge: 2020-12-14 | DRG: 856 | Disposition: E | Payer: Medicare Other | Attending: Critical Care Medicine | Admitting: Critical Care Medicine

## 2020-11-13 ENCOUNTER — Encounter (HOSPITAL_COMMUNITY): Payer: Self-pay

## 2020-11-13 DIAGNOSIS — I5042 Chronic combined systolic (congestive) and diastolic (congestive) heart failure: Secondary | ICD-10-CM | POA: Diagnosis present

## 2020-11-13 DIAGNOSIS — R0902 Hypoxemia: Secondary | ICD-10-CM | POA: Diagnosis not present

## 2020-11-13 DIAGNOSIS — N186 End stage renal disease: Secondary | ICD-10-CM | POA: Diagnosis present

## 2020-11-13 DIAGNOSIS — Z96652 Presence of left artificial knee joint: Secondary | ICD-10-CM | POA: Diagnosis present

## 2020-11-13 DIAGNOSIS — E785 Hyperlipidemia, unspecified: Secondary | ICD-10-CM | POA: Diagnosis not present

## 2020-11-13 DIAGNOSIS — N2581 Secondary hyperparathyroidism of renal origin: Secondary | ICD-10-CM | POA: Diagnosis present

## 2020-11-13 DIAGNOSIS — T68XXXA Hypothermia, initial encounter: Secondary | ICD-10-CM | POA: Diagnosis not present

## 2020-11-13 DIAGNOSIS — R1310 Dysphagia, unspecified: Secondary | ICD-10-CM | POA: Diagnosis present

## 2020-11-13 DIAGNOSIS — Z79899 Other long term (current) drug therapy: Secondary | ICD-10-CM

## 2020-11-13 DIAGNOSIS — D684 Acquired coagulation factor deficiency: Secondary | ICD-10-CM | POA: Diagnosis not present

## 2020-11-13 DIAGNOSIS — A419 Sepsis, unspecified organism: Secondary | ICD-10-CM | POA: Diagnosis present

## 2020-11-13 DIAGNOSIS — E440 Moderate protein-calorie malnutrition: Secondary | ICD-10-CM | POA: Insufficient documentation

## 2020-11-13 DIAGNOSIS — Z91048 Other nonmedicinal substance allergy status: Secondary | ICD-10-CM

## 2020-11-13 DIAGNOSIS — Z833 Family history of diabetes mellitus: Secondary | ICD-10-CM

## 2020-11-13 DIAGNOSIS — T8781 Dehiscence of amputation stump: Secondary | ICD-10-CM | POA: Diagnosis present

## 2020-11-13 DIAGNOSIS — E43 Unspecified severe protein-calorie malnutrition: Secondary | ICD-10-CM | POA: Diagnosis present

## 2020-11-13 DIAGNOSIS — R627 Adult failure to thrive: Secondary | ICD-10-CM | POA: Diagnosis not present

## 2020-11-13 DIAGNOSIS — T8740 Infection of amputation stump, unspecified extremity: Secondary | ICD-10-CM | POA: Diagnosis present

## 2020-11-13 DIAGNOSIS — T8130XA Disruption of wound, unspecified, initial encounter: Secondary | ICD-10-CM

## 2020-11-13 DIAGNOSIS — Z8249 Family history of ischemic heart disease and other diseases of the circulatory system: Secondary | ICD-10-CM

## 2020-11-13 DIAGNOSIS — R6521 Severe sepsis with septic shock: Secondary | ICD-10-CM | POA: Diagnosis not present

## 2020-11-13 DIAGNOSIS — T8744 Infection of amputation stump, left lower extremity: Secondary | ICD-10-CM | POA: Diagnosis present

## 2020-11-13 DIAGNOSIS — I120 Hypertensive chronic kidney disease with stage 5 chronic kidney disease or end stage renal disease: Secondary | ICD-10-CM | POA: Diagnosis not present

## 2020-11-13 DIAGNOSIS — J9601 Acute respiratory failure with hypoxia: Secondary | ICD-10-CM | POA: Diagnosis present

## 2020-11-13 DIAGNOSIS — I9581 Postprocedural hypotension: Secondary | ICD-10-CM | POA: Diagnosis not present

## 2020-11-13 DIAGNOSIS — Z89612 Acquired absence of left leg above knee: Secondary | ICD-10-CM | POA: Diagnosis not present

## 2020-11-13 DIAGNOSIS — Z794 Long term (current) use of insulin: Secondary | ICD-10-CM

## 2020-11-13 DIAGNOSIS — E1152 Type 2 diabetes mellitus with diabetic peripheral angiopathy with gangrene: Secondary | ICD-10-CM | POA: Diagnosis present

## 2020-11-13 DIAGNOSIS — Z91041 Radiographic dye allergy status: Secondary | ICD-10-CM

## 2020-11-13 DIAGNOSIS — E875 Hyperkalemia: Secondary | ICD-10-CM | POA: Diagnosis present

## 2020-11-13 DIAGNOSIS — D696 Thrombocytopenia, unspecified: Secondary | ICD-10-CM | POA: Diagnosis not present

## 2020-11-13 DIAGNOSIS — E1122 Type 2 diabetes mellitus with diabetic chronic kidney disease: Secondary | ICD-10-CM | POA: Diagnosis present

## 2020-11-13 DIAGNOSIS — I10 Essential (primary) hypertension: Secondary | ICD-10-CM | POA: Diagnosis present

## 2020-11-13 DIAGNOSIS — I959 Hypotension, unspecified: Secondary | ICD-10-CM | POA: Diagnosis not present

## 2020-11-13 DIAGNOSIS — Z20822 Contact with and (suspected) exposure to covid-19: Secondary | ICD-10-CM | POA: Diagnosis present

## 2020-11-13 DIAGNOSIS — K222 Esophageal obstruction: Secondary | ICD-10-CM | POA: Diagnosis not present

## 2020-11-13 DIAGNOSIS — E1165 Type 2 diabetes mellitus with hyperglycemia: Secondary | ICD-10-CM | POA: Diagnosis present

## 2020-11-13 DIAGNOSIS — Z5309 Procedure and treatment not carried out because of other contraindication: Secondary | ICD-10-CM | POA: Diagnosis not present

## 2020-11-13 DIAGNOSIS — Z888 Allergy status to other drugs, medicaments and biological substances status: Secondary | ICD-10-CM

## 2020-11-13 DIAGNOSIS — Z66 Do not resuscitate: Secondary | ICD-10-CM | POA: Diagnosis present

## 2020-11-13 DIAGNOSIS — Y835 Amputation of limb(s) as the cause of abnormal reaction of the patient, or of later complication, without mention of misadventure at the time of the procedure: Secondary | ICD-10-CM | POA: Diagnosis present

## 2020-11-13 DIAGNOSIS — F32A Depression, unspecified: Secondary | ICD-10-CM | POA: Diagnosis present

## 2020-11-13 DIAGNOSIS — E11649 Type 2 diabetes mellitus with hypoglycemia without coma: Secondary | ICD-10-CM | POA: Diagnosis present

## 2020-11-13 DIAGNOSIS — Z515 Encounter for palliative care: Secondary | ICD-10-CM

## 2020-11-13 DIAGNOSIS — F39 Unspecified mood [affective] disorder: Secondary | ICD-10-CM | POA: Diagnosis present

## 2020-11-13 DIAGNOSIS — E1142 Type 2 diabetes mellitus with diabetic polyneuropathy: Secondary | ICD-10-CM | POA: Diagnosis present

## 2020-11-13 DIAGNOSIS — Z9115 Patient's noncompliance with renal dialysis: Secondary | ICD-10-CM

## 2020-11-13 DIAGNOSIS — D6832 Hemorrhagic disorder due to extrinsic circulating anticoagulants: Secondary | ICD-10-CM | POA: Diagnosis not present

## 2020-11-13 DIAGNOSIS — E872 Acidosis: Secondary | ICD-10-CM | POA: Diagnosis present

## 2020-11-13 DIAGNOSIS — G9341 Metabolic encephalopathy: Secondary | ICD-10-CM | POA: Diagnosis present

## 2020-11-13 DIAGNOSIS — I517 Cardiomegaly: Secondary | ICD-10-CM | POA: Diagnosis not present

## 2020-11-13 DIAGNOSIS — Z452 Encounter for adjustment and management of vascular access device: Secondary | ICD-10-CM

## 2020-11-13 DIAGNOSIS — Z4659 Encounter for fitting and adjustment of other gastrointestinal appliance and device: Secondary | ICD-10-CM

## 2020-11-13 DIAGNOSIS — M1990 Unspecified osteoarthritis, unspecified site: Secondary | ICD-10-CM | POA: Diagnosis present

## 2020-11-13 DIAGNOSIS — F17210 Nicotine dependence, cigarettes, uncomplicated: Secondary | ICD-10-CM | POA: Diagnosis present

## 2020-11-13 DIAGNOSIS — E162 Hypoglycemia, unspecified: Secondary | ICD-10-CM

## 2020-11-13 DIAGNOSIS — T8149XA Infection following a procedure, other surgical site, initial encounter: Secondary | ICD-10-CM | POA: Diagnosis present

## 2020-11-13 DIAGNOSIS — Z8049 Family history of malignant neoplasm of other genital organs: Secondary | ICD-10-CM

## 2020-11-13 DIAGNOSIS — R0602 Shortness of breath: Secondary | ICD-10-CM | POA: Diagnosis not present

## 2020-11-13 DIAGNOSIS — R1319 Other dysphagia: Secondary | ICD-10-CM | POA: Diagnosis not present

## 2020-11-13 DIAGNOSIS — Z992 Dependence on renal dialysis: Secondary | ICD-10-CM | POA: Diagnosis not present

## 2020-11-13 DIAGNOSIS — F419 Anxiety disorder, unspecified: Secondary | ICD-10-CM | POA: Diagnosis present

## 2020-11-13 DIAGNOSIS — I509 Heart failure, unspecified: Secondary | ICD-10-CM | POA: Diagnosis not present

## 2020-11-13 DIAGNOSIS — B1920 Unspecified viral hepatitis C without hepatic coma: Secondary | ICD-10-CM | POA: Diagnosis present

## 2020-11-13 DIAGNOSIS — I5021 Acute systolic (congestive) heart failure: Secondary | ICD-10-CM | POA: Diagnosis not present

## 2020-11-13 DIAGNOSIS — L02416 Cutaneous abscess of left lower limb: Secondary | ICD-10-CM | POA: Diagnosis present

## 2020-11-13 DIAGNOSIS — Z7189 Other specified counseling: Secondary | ICD-10-CM | POA: Diagnosis not present

## 2020-11-13 DIAGNOSIS — E876 Hypokalemia: Secondary | ICD-10-CM | POA: Diagnosis not present

## 2020-11-13 DIAGNOSIS — J90 Pleural effusion, not elsewhere classified: Secondary | ICD-10-CM | POA: Diagnosis not present

## 2020-11-13 DIAGNOSIS — Z6820 Body mass index (BMI) 20.0-20.9, adult: Secondary | ICD-10-CM

## 2020-11-13 DIAGNOSIS — K5901 Slow transit constipation: Secondary | ICD-10-CM | POA: Diagnosis present

## 2020-11-13 DIAGNOSIS — Z882 Allergy status to sulfonamides status: Secondary | ICD-10-CM

## 2020-11-13 DIAGNOSIS — N250 Renal osteodystrophy: Secondary | ICD-10-CM | POA: Diagnosis not present

## 2020-11-13 DIAGNOSIS — J9811 Atelectasis: Secondary | ICD-10-CM | POA: Diagnosis not present

## 2020-11-13 DIAGNOSIS — K31819 Angiodysplasia of stomach and duodenum without bleeding: Secondary | ICD-10-CM | POA: Diagnosis present

## 2020-11-13 DIAGNOSIS — I739 Peripheral vascular disease, unspecified: Secondary | ICD-10-CM | POA: Diagnosis not present

## 2020-11-13 DIAGNOSIS — I2510 Atherosclerotic heart disease of native coronary artery without angina pectoris: Secondary | ICD-10-CM | POA: Diagnosis present

## 2020-11-13 DIAGNOSIS — R54 Age-related physical debility: Secondary | ICD-10-CM | POA: Diagnosis present

## 2020-11-13 DIAGNOSIS — E869 Volume depletion, unspecified: Secondary | ICD-10-CM | POA: Diagnosis not present

## 2020-11-13 DIAGNOSIS — D631 Anemia in chronic kidney disease: Secondary | ICD-10-CM | POA: Diagnosis not present

## 2020-11-13 DIAGNOSIS — Z91013 Allergy to seafood: Secondary | ICD-10-CM

## 2020-11-13 DIAGNOSIS — E871 Hypo-osmolality and hyponatremia: Secondary | ICD-10-CM | POA: Diagnosis not present

## 2020-11-13 DIAGNOSIS — Z4682 Encounter for fitting and adjustment of non-vascular catheter: Secondary | ICD-10-CM | POA: Diagnosis not present

## 2020-11-13 LAB — BASIC METABOLIC PANEL
Anion gap: 16 — ABNORMAL HIGH (ref 5–15)
BUN: 57 mg/dL — ABNORMAL HIGH (ref 8–23)
CO2: 21 mmol/L — ABNORMAL LOW (ref 22–32)
Calcium: 6.4 mg/dL — CL (ref 8.9–10.3)
Chloride: 94 mmol/L — ABNORMAL LOW (ref 98–111)
Creatinine, Ser: 6.44 mg/dL — ABNORMAL HIGH (ref 0.44–1.00)
GFR, Estimated: 7 mL/min — ABNORMAL LOW (ref >60)
Glucose, Bld: 113 mg/dL — ABNORMAL HIGH (ref 70–99)
Potassium: 5.4 mmol/L — ABNORMAL HIGH (ref 3.5–5.1)
Sodium: 131 mmol/L — ABNORMAL LOW (ref 135–145)

## 2020-11-13 LAB — I-STAT VENOUS BLOOD GAS, ED
Acid-Base Excess: 2 mmol/L (ref 0.0–2.0)
Acid-Base Excess: 3 mmol/L — ABNORMAL HIGH (ref 0.0–2.0)
Bicarbonate: 27.6 mmol/L (ref 20.0–28.0)
Bicarbonate: 28.2 mmol/L — ABNORMAL HIGH (ref 20.0–28.0)
Calcium, Ion: 0.82 mmol/L — CL (ref 1.15–1.40)
Calcium, Ion: 0.8 mmol/L — CL (ref 1.15–1.40)
HCT: 36 % (ref 36.0–46.0)
HCT: 31 % — ABNORMAL LOW (ref 36.0–46.0)
Hemoglobin: 12.2 g/dL (ref 12.0–15.0)
Hemoglobin: 10.5 g/dL — ABNORMAL LOW (ref 12.0–15.0)
O2 Saturation: 86 %
O2 Saturation: 84 %
Patient temperature: 93.2
Potassium: 5.2 mmol/L — ABNORMAL HIGH (ref 3.5–5.1)
Potassium: 4.9 mmol/L (ref 3.5–5.1)
Sodium: 131 mmol/L — ABNORMAL LOW (ref 135–145)
Sodium: 133 mmol/L — ABNORMAL LOW (ref 135–145)
TCO2: 29 mmol/L (ref 22–32)
TCO2: 30 mmol/L (ref 22–32)
pCO2, Ven: 44.5 mmHg (ref 44.0–60.0)
pCO2, Ven: 39.5 mmHg — ABNORMAL LOW (ref 44.0–60.0)
pH, Ven: 7.4 (ref 7.250–7.430)
pH, Ven: 7.449 — ABNORMAL HIGH (ref 7.250–7.430)
pO2, Ven: 52 mmHg — ABNORMAL HIGH (ref 32.0–45.0)
pO2, Ven: 40 mmHg (ref 32.0–45.0)

## 2020-11-13 LAB — LACTIC ACID, PLASMA
Lactic Acid, Venous: 2.6 mmol/L (ref 0.5–1.9)
Lactic Acid, Venous: 2.1 mmol/L (ref 0.5–1.9)
Lactic Acid, Venous: 2 mmol/L (ref 0.5–1.9)

## 2020-11-13 LAB — CBC WITH DIFFERENTIAL/PLATELET
Abs Immature Granulocytes: 0.03 10*3/uL (ref 0.00–0.07)
Basophils Absolute: 0 10*3/uL (ref 0.0–0.1)
Basophils Relative: 0 %
Eosinophils Absolute: 0 10*3/uL (ref 0.0–0.5)
Eosinophils Relative: 0 %
HCT: 33.8 % — ABNORMAL LOW (ref 36.0–46.0)
Hemoglobin: 10.7 g/dL — ABNORMAL LOW (ref 12.0–15.0)
Immature Granulocytes: 0 %
Lymphocytes Relative: 1 %
Lymphs Abs: 0.1 10*3/uL — ABNORMAL LOW (ref 0.7–4.0)
MCH: 27.7 pg (ref 26.0–34.0)
MCHC: 31.7 g/dL (ref 30.0–36.0)
MCV: 87.6 fL (ref 80.0–100.0)
Monocytes Absolute: 0.1 10*3/uL (ref 0.1–1.0)
Monocytes Relative: 2 %
Neutro Abs: 7.7 10*3/uL (ref 1.7–7.7)
Neutrophils Relative %: 97 %
Platelets: 143 10*3/uL — ABNORMAL LOW (ref 150–400)
RBC: 3.86 MIL/uL — ABNORMAL LOW (ref 3.87–5.11)
RDW: 18.9 % — ABNORMAL HIGH (ref 11.5–15.5)
WBC: 7.9 10*3/uL (ref 4.0–10.5)
nRBC: 0.4 % — ABNORMAL HIGH (ref 0.0–0.2)

## 2020-11-13 LAB — PROCALCITONIN: Procalcitonin: 6.11 ng/mL

## 2020-11-13 LAB — C-REACTIVE PROTEIN: CRP: 4.9 mg/dL — ABNORMAL HIGH (ref <1.0)

## 2020-11-13 LAB — RESP PANEL BY RT-PCR (FLU A&B, COVID) ARPGX2
Influenza A by PCR: NEGATIVE
Influenza B by PCR: NEGATIVE
SARS Coronavirus 2 by RT PCR: NEGATIVE

## 2020-11-13 LAB — PREALBUMIN: Prealbumin: 10.5 mg/dL — ABNORMAL LOW (ref 18–38)

## 2020-11-13 LAB — SEDIMENTATION RATE: Sed Rate: 8 mm/hr (ref 0–22)

## 2020-11-13 LAB — CBG MONITORING, ED: Glucose-Capillary: 77 mg/dL (ref 70–99)

## 2020-11-13 LAB — TROPONIN I (HIGH SENSITIVITY): Troponin I (High Sensitivity): 69 ng/L — ABNORMAL HIGH (ref <18)

## 2020-11-13 MED ORDER — LIDOCAINE VISCOUS HCL 2 % MT SOLN
15.0000 mL | Freq: Once | OROMUCOSAL | Status: AC
  Administered 2020-11-13: 15 mL via OROMUCOSAL
  Filled 2020-11-13: qty 15

## 2020-11-13 MED ORDER — LACTATED RINGERS IV BOLUS (SEPSIS)
1000.0000 mL | Freq: Once | INTRAVENOUS | Status: AC
  Administered 2020-11-13: 1000 mL via INTRAVENOUS

## 2020-11-13 MED ORDER — PANTOPRAZOLE SODIUM 40 MG PO TBEC
40.0000 mg | DELAYED_RELEASE_TABLET | Freq: Every day | ORAL | Status: DC
  Administered 2020-11-13: 40 mg via ORAL
  Filled 2020-11-13 (×2): qty 1

## 2020-11-13 MED ORDER — NEPRO/CARBSTEADY PO LIQD
237.0000 mL | Freq: Three times a day (TID) | ORAL | Status: DC | PRN
  Filled 2020-11-13: qty 237

## 2020-11-13 MED ORDER — SODIUM CHLORIDE 0.9 % IV SOLN
2.0000 g | Freq: Once | INTRAVENOUS | Status: AC
  Administered 2020-11-13: 2 g via INTRAVENOUS
  Filled 2020-11-13: qty 20

## 2020-11-13 MED ORDER — ACETAMINOPHEN 500 MG PO TABS
1000.0000 mg | ORAL_TABLET | Freq: Once | ORAL | Status: AC
  Administered 2020-11-13: 1000 mg via ORAL
  Filled 2020-11-13: qty 2

## 2020-11-13 MED ORDER — HYDROXYZINE HCL 25 MG PO TABS: 25.0000 mg | ORAL_TABLET | Freq: Three times a day (TID) | ORAL | Status: DC | PRN

## 2020-11-13 MED ORDER — CALCIUM GLUCONATE-NACL 1-0.675 GM/50ML-% IV SOLN
1.0000 g | Freq: Once | INTRAVENOUS | Status: AC
  Administered 2020-11-13: 1000 mg via INTRAVENOUS
  Filled 2020-11-13: qty 50

## 2020-11-13 MED ORDER — CALCIUM ACETATE (PHOS BINDER) 667 MG PO CAPS: 667.0000 mg | ORAL_CAPSULE | ORAL | Status: DC

## 2020-11-13 MED ORDER — SORBITOL 70 % SOLN
30.0000 mL | Status: DC | PRN
  Filled 2020-11-13: qty 30

## 2020-11-13 MED ORDER — CALCIUM CARBONATE ANTACID 1250 MG/5ML PO SUSP
500.0000 mg | Freq: Four times a day (QID) | ORAL | Status: DC | PRN
  Filled 2020-11-13: qty 5

## 2020-11-13 MED ORDER — ZOLPIDEM TARTRATE 5 MG PO TABS: 5.0000 mg | ORAL_TABLET | Freq: Every evening | ORAL | Status: DC | PRN

## 2020-11-13 MED ORDER — DOCUSATE SODIUM 283 MG RE ENEM
1.0000 | ENEMA | RECTAL | Status: DC | PRN
  Filled 2020-11-13: qty 1

## 2020-11-13 MED ORDER — POLYETHYLENE GLYCOL 3350 17 G PO PACK: 17.0000 g | PACK | Freq: Every day | ORAL | Status: DC | PRN

## 2020-11-13 MED ORDER — CALCIUM ACETATE (PHOS BINDER) 667 MG PO CAPS
1334.0000 mg | ORAL_CAPSULE | Freq: Three times a day (TID) | ORAL | Status: DC
  Filled 2020-11-13 (×7): qty 2

## 2020-11-13 MED ORDER — VANCOMYCIN HCL 1250 MG/250ML IV SOLN
1250.0000 mg | Freq: Once | INTRAVENOUS | Status: AC
  Administered 2020-11-13: 1250 mg via INTRAVENOUS
  Filled 2020-11-13: qty 250

## 2020-11-13 MED ORDER — CAMPHOR-MENTHOL 0.5-0.5 % EX LOTN
1.0000 "application " | TOPICAL_LOTION | Freq: Three times a day (TID) | CUTANEOUS | Status: DC | PRN
  Filled 2020-11-13: qty 222

## 2020-11-13 MED ORDER — SODIUM CHLORIDE 0.9% FLUSH
3.0000 mL | Freq: Two times a day (BID) | INTRAVENOUS | Status: DC
  Administered 2020-11-14 – 2020-11-16 (×3): 3 mL via INTRAVENOUS

## 2020-11-13 MED ORDER — ATORVASTATIN CALCIUM 10 MG PO TABS
10.0000 mg | ORAL_TABLET | Freq: Every day | ORAL | Status: DC
  Administered 2020-11-14: 10 mg via ORAL
  Filled 2020-11-13 (×2): qty 1

## 2020-11-13 MED ORDER — LACTATED RINGERS IV SOLN: INTRAVENOUS | Status: DC

## 2020-11-13 MED ORDER — SODIUM CHLORIDE 0.9 % IV SOLN
1.0000 g | INTRAVENOUS | Status: DC
  Administered 2020-11-13: 1 g via INTRAVENOUS
  Filled 2020-11-13 (×3): qty 1

## 2020-11-13 MED ORDER — ACETAMINOPHEN 325 MG PO TABS
650.0000 mg | ORAL_TABLET | Freq: Four times a day (QID) | ORAL | Status: DC | PRN
  Administered 2020-11-16: 650 mg via ORAL
  Filled 2020-11-13: qty 2

## 2020-11-13 MED ORDER — ISOSORBIDE MONONITRATE ER 30 MG PO TB24: 30.0000 mg | ORAL_TABLET | Freq: Every day | ORAL | Status: DC

## 2020-11-13 MED ORDER — ONDANSETRON HCL 4 MG/2ML IJ SOLN
4.0000 mg | Freq: Once | INTRAMUSCULAR | Status: AC
Start: 2020-11-13 — End: 2020-11-13
  Administered 2020-11-13: 4 mg via INTRAVENOUS
  Filled 2020-11-13: qty 2

## 2020-11-13 MED ORDER — SODIUM ZIRCONIUM CYCLOSILICATE 5 G PO PACK
5.0000 g | PACK | Freq: Once | ORAL | Status: AC
  Administered 2020-11-13: 5 g via ORAL
  Filled 2020-11-13: qty 1

## 2020-11-13 MED ORDER — ONDANSETRON HCL 4 MG/2ML IJ SOLN: 4.0000 mg | Freq: Four times a day (QID) | INTRAMUSCULAR | Status: DC | PRN

## 2020-11-13 MED ORDER — CALCIUM ACETATE (PHOS BINDER) 667 MG PO CAPS
667.0000 mg | ORAL_CAPSULE | ORAL | Status: DC
  Administered 2020-11-13 – 2020-11-14 (×2): 667 mg via ORAL
  Filled 2020-11-13 (×5): qty 1

## 2020-11-13 MED ORDER — METRONIDAZOLE 500 MG/100ML IV SOLN
500.0000 mg | Freq: Three times a day (TID) | INTRAVENOUS | Status: DC
  Administered 2020-11-13 – 2020-11-16 (×7): 500 mg via INTRAVENOUS
  Filled 2020-11-13 (×7): qty 100

## 2020-11-13 MED ORDER — FERROUS SULFATE 325 (65 FE) MG PO TABS
325.0000 mg | ORAL_TABLET | Freq: Every day | ORAL | Status: DC
  Administered 2020-11-13: 325 mg via ORAL
  Filled 2020-11-13 (×2): qty 1

## 2020-11-13 MED ORDER — INSULIN ASPART 100 UNIT/ML IJ SOLN: 0.0000 [IU] | Freq: Three times a day (TID) | INTRAMUSCULAR | Status: DC

## 2020-11-13 MED ORDER — MISOPROSTOL 100 MCG PO TABS
100.0000 ug | ORAL_TABLET | Freq: Four times a day (QID) | ORAL | Status: DC
  Administered 2020-11-13 – 2020-11-16 (×4): 100 ug via ORAL
  Filled 2020-11-13 (×15): qty 1

## 2020-11-13 MED ORDER — SILVER SULFADIAZINE 1 % EX CREA
TOPICAL_CREAM | Freq: Once | CUTANEOUS | Status: AC
  Filled 2020-11-13: qty 85

## 2020-11-13 MED ORDER — VANCOMYCIN HCL 500 MG/100ML IV SOLN
500.0000 mg | INTRAVENOUS | Status: DC
  Filled 2020-11-13: qty 100

## 2020-11-13 MED ORDER — SODIUM CHLORIDE 0.9 % IV BOLUS
250.0000 mL | Freq: Once | INTRAVENOUS | Status: AC
  Administered 2020-11-13: 250 mL via INTRAVENOUS

## 2020-11-13 MED ORDER — MORPHINE SULFATE (PF) 2 MG/ML IV SOLN
2.0000 mg | INTRAVENOUS | Status: DC | PRN
  Administered 2020-11-14 (×2): 2 mg via INTRAVENOUS
  Filled 2020-11-13 (×2): qty 1

## 2020-11-13 MED ORDER — ONDANSETRON HCL 4 MG PO TABS: 4.0000 mg | ORAL_TABLET | Freq: Four times a day (QID) | ORAL | Status: DC | PRN

## 2020-11-13 MED ORDER — ACETAMINOPHEN 650 MG RE SUPP: 650.0000 mg | Freq: Four times a day (QID) | RECTAL | Status: DC | PRN

## 2020-11-13 MED ORDER — NORTRIPTYLINE HCL 10 MG PO CAPS
30.0000 mg | ORAL_CAPSULE | Freq: Every day | ORAL | Status: DC
  Administered 2020-11-14: 30 mg via ORAL
  Filled 2020-11-13 (×5): qty 3

## 2020-11-13 MED ORDER — VANCOMYCIN HCL 500 MG/100ML IV SOLN: 500.0000 mg | INTRAVENOUS | Status: DC

## 2020-11-13 MED ORDER — RENA-VITE PO TABS
1.0000 | ORAL_TABLET | Freq: Every morning | ORAL | Status: DC
  Filled 2020-11-13 (×2): qty 1

## 2020-11-13 MED ORDER — NITROGLYCERIN 0.4 MG SL SUBL: 0.4000 mg | SUBLINGUAL_TABLET | SUBLINGUAL | Status: DC | PRN

## 2020-11-13 MED ORDER — HEPARIN SODIUM (PORCINE) 5000 UNIT/ML IJ SOLN
5000.0000 [IU] | Freq: Three times a day (TID) | INTRAMUSCULAR | Status: DC
  Administered 2020-11-13 – 2020-11-15 (×4): 5000 [IU] via SUBCUTANEOUS
  Filled 2020-11-13 (×4): qty 1

## 2020-11-13 NOTE — ED Notes (Signed)
Called lab to add troponin to blood collected at 1700

## 2020-11-13 NOTE — ED Notes (Signed)
Bair hugger applied at this time

## 2020-11-13 NOTE — Progress Notes (Signed)
Renal Navigator appreciates call from EDP/Dr. Gilford Raid, who states patient missed HD today due to ED eval and is cleared for discharge. Navigator spoke with outpatient HD clinic manager/J. Juleen China at [facility] to request an appointment tomorrow to reschedule missed appointment today.  Patient has been given an appointment for tomorrow, Wednesday, 11/25/2020, arrive at 10:30am for a 10:45am chair. Navigator contacted patient's son Raquel Sarna, who confirms that he can take her. Navigator informed him that his mom will need to continue her regular HD schedule Thursday.   EDP updated and states she can inform patient of above.   Alphonzo Cruise, [place] Renal Navigator [phone]

## 2020-11-13 NOTE — Progress Notes (Signed)
Renal Navigator appreciates update from EDP, who states there has been a change in patient's clinical status and she is now being admitted. She will contact Dr. Jonnie Finner for inpt Nephrology consult.  Navigator contacted outpatient clinic to cancel rescheduled HD appt tomorrow. Navigator has attempted to call patient's son back, and on third attempt was able to connect and let him know that the plan has changed to his mother being admitted. Patient's son was very appreciative for the update.   Alphonzo Cruise, [place] Renal Navigator [phone]

## 2020-11-13 NOTE — Telephone Encounter (Signed)
Attempted to reach daughter, unable to leave voicemail. Will try again.

## 2020-11-13 NOTE — Discharge Instructions (Addendum)
Go to dialysis at your regular center tomorrow.  Be there at 1030 for a 1045 appointment.

## 2020-11-13 NOTE — Telephone Encounter (Signed)
Informed pharmacy that medication is for left stump infection/wound/pain

## 2020-11-13 NOTE — Sepsis Progress Note (Signed)
eLink tracking Code Sepsis.

## 2020-11-13 NOTE — ED Notes (Addendum)
Previous note charted in error

## 2020-11-13 NOTE — ED Notes (Signed)
Pt refusing dinner tray at this time

## 2020-11-13 NOTE — ED Triage Notes (Signed)
Patient arrived by [facility] and feels as if her pills are stuck in her throat, patient was due for dialysis today and didn't go due to same. Patient states she has been getting food and pills stuck for the past week. Patient with good sats and coughing intermittently during triage

## 2020-11-13 NOTE — ED Notes (Signed)
MD notified of patient complaint of chest pain.  New orders received.  1st troponin to be added on to blood currently in lab

## 2020-11-13 NOTE — ED Notes (Addendum)
Pt reports she hasn't been able to see out of her Left eye x1 week. Pt has LAKA, area with mild purulent drainage and erythema. Pt states she's here because she cant breathe and has a pill stuck in her throat. Pt also reports having multiple falls recently. Bruising noted to her Right shin, knee and Left forehead

## 2020-11-13 NOTE — Progress Notes (Addendum)
Pharmacy Antibiotic Note  Brandy Houston is a 65 y.o. female admitted on 10/21/2020 with cellulitis.  Pharmacy has been consulted for vancomycin and cefepime dosing. Patient is s/p left AKA by Dr. Sharol Given and follows with wound care outpatient, last visit on 5/27 with plan for silvadene and dressing change. Patient is also ESRD with HD TThS. Today patient presents with difficulty swallowing. Patient missed HD today due to presenting to the ED. Most recent WBC 7.9 and LA 2.6. No current plans for HD today per notes. Will continue to monitor for intermittent HD sessions for vancomycin doses.  Plan: Vancomycin 1250 MG x1 load today  Vancomycin 500 MG QHD TThS starting 11/15/20 Cefepime 1G Q24H  Monitor for intermittent HD sessions for supplemental doses Monitor clinical status, treatment plan, and cultures Vancomycin levels as indicated     Temp (24hrs), Avg:94.4 F (34.7 C), Min:93.2 F (34 C), Max:95.5 F (35.3 C)  Recent Labs  Lab 11/08/20 1409 11/01/2020 1104 10/14/2020 1230  WBC 10.1 7.9  --   CREATININE 5.97* 6.44*  --   LATICACIDVEN  --   --  2.6*    Estimated Creatinine Clearance: 7.9 mL/min (A) (by C-G formula based on SCr of 6.44 mg/dL (H)).    Allergies  Allergen Reactions  . Compazine [Prochlorperazine] Shortness Of Breath, Swelling and Other (See Comments)    TONGUE SWELLS  . Shellfish-Derived Products Anaphylaxis  . Iodinated Diagnostic Agents Hives and Rash  . Omnipaque [Iohexol] Hives  . Tape Other (See Comments)    Owens Shark, paper tape caused skin irritation  . Sulfa Antibiotics Rash    Antimicrobials this admission: 5/31 Vanc >> 5/31 Cefepime >> 5/31 CTX x1  Dose adjustments this admission:  Microbiology results: 5/31 BCx: sent  Thank you for allowing pharmacy to be a part of this patient's care.  Cephus Slater, PharmD, MBA Pharmacy Resident [phone] 10/28/2020 4:31 PM

## 2020-11-13 NOTE — H&P (Addendum)
History and Physical    Brandy Houston KKX:[id number] DOB: 1955/10/30 DOA: 11/07/2020  PCP: [name], MD Consultants:  Silverio Decamp - GI; Gardiner Rhyme - cardiology; nephrology Patient coming from:  Home - lives with son; NOK: [place], [place], [phone]  Chief Complaint: Amputation site infection  HPI: Brandy Houston is a 65 y.o. 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  She is very ill-appearing at this time and difficult to understand with quiet speech and the Bair hugger in place.  She reports chronic difficulty with her left stump and with infection there as well as recent progressive dysphagia.  Her son reports that they have been taking antibiotics for the stump infection and it hasn't been getting better faster enough for him.    ED Course:   Initially planned for outpatient f/u but temp worsening in ER.  Has AKA, fell with wound and now infection despite antibiotics and Silvadene dressing changes.  Dr. Sharol Given will see tomorrow.  Given Vanc/Rocephin.  O2 sats inaccurate due to hypothermia.  Also hypocalcemic and mildly hyperkalemic.  Call in to nephrology.  Review of Systems: As per HPI; otherwise review of systems reviewed and negative.   Ambulatory Status:  Users a wheelchair  COVID Vaccine Status:  Complete   Past Medical History:  Diagnosis Date  . Allergy   . Anemia   . Anxiety   . Arthritis    "in my joints" (03/10/2017)  . Chronic diastolic CHF (congestive heart failure) ([place])   . Depression    Chronic  . Diabetic peripheral neuropathy ([place])   . Dysrhythmia    tachycardia, normal ECHO 08-09-14  . ESRD (end stage renal disease) ([place])   . GERD (gastroesophageal reflux disease)   . Heart murmur     dx'd 02/05/2017  . Hepatitis C    "tx'd in 2016; I'm negative now" (02/05/2017)  . History of blood  transfusion 2017   "w/knee replacement"  . Hyperlipidemia   . Hypertension   . Peripheral neuropathy   . Protein calorie malnutrition ([place])   . Septic arthritis ([place])   . Slow transit constipation   . Symptomatic anemia 01/27/2020  . Type II diabetes mellitus ([name])    IDDM  . Uncontrolled hypertension 02/18/2013    Past Surgical History:  Procedure Laterality Date  . A/V FISTULAGRAM Right 07/07/2018   Procedure: A/V FISTULAGRAM;  Surgeon: Marty Heck, MD;  Location: [place] [facility];  Service: Cardiovascular;  Laterality: Right;  . AMPUTATION Left 03/20/2017   Procedure: LEFT ABOVE KNEE AMPUTATION;  Surgeon: Newt Minion, MD;  Location: [place];  Service: Orthopedics;  Laterality: Left;  . APPENDECTOMY    . AV FISTULA PLACEMENT Left 09/13/2014   Procedure: Brachial Artery to Brachial Vein Gortex Four - Seven Stretch GRAFT INSERTION Left Forearm;  Surgeon: Mal Misty, MD;  Location: [place];  Service: Vascular;  Laterality: Left;  . BASCILIC VEIN TRANSPOSITION Right 10/26/2017   Procedure: BASILIC VEIN TRANSPOSITION FIRST STAGE RIGHT ARM;  Surgeon: Conrad Potala Pastillo, MD;  Location: [place];  Service: Vascular;  Laterality: Right;  . BASCILIC VEIN TRANSPOSITION Right 02/01/2018   Procedure: SECOND STAGE BASILIC VEIN TRANSPOSITION RIGHT UPPER EXTREMITY;  Surgeon: Marty Heck, MD;  Location: [place];  Service: Vascular;  Laterality: Right;  . CHOLECYSTECTOMY OPEN    . COLON  SURGERY    . COLONOSCOPY    . ENTEROSCOPY N/A 05/28/2020   Procedure: ENTEROSCOPY;  Surgeon: Mauri Pole, MD;  Location: [facility] ENDOSCOPY;  Service: Endoscopy;  Laterality: N/A;  . ENTEROSCOPY N/A 06/25/2020   Procedure: ENTEROSCOPY;  Surgeon: Milus Banister, MD;  Location: [facility] ENDOSCOPY;  Service: Gastroenterology;  Laterality: N/A;  . ESOPHAGOGASTRODUODENOSCOPY (EGD) WITH PROPOFOL N/A 05/14/2020   Procedure: ESOPHAGOGASTRODUODENOSCOPY (EGD) WITH PROPOFOL;  Surgeon: Mauri Pole, MD;  Location: [facility]  ENDOSCOPY;  Service: Endoscopy;  Laterality: N/A;  with APC  . EXCISIONAL TOTAL KNEE ARTHROPLASTY WITH ANTIBIOTIC SPACERS Left 02/06/2017   Procedure: Incisional total iknee with antibiotic spacer ;  Surgeon: Leandrew Koyanagi, MD;  Location: [place];  Service: Orthopedics;  Laterality: Left;  . GIVENS CAPSULE STUDY N/A 09/09/2020   Procedure: GIVENS CAPSULE STUDY;  Surgeon: Jackquline Denmark, MD;  Location: [facility] ENDOSCOPY;  Service: Endoscopy;  Laterality: N/A;  . HOT HEMOSTASIS N/A 05/14/2020   Procedure: HOT HEMOSTASIS (ARGON PLASMA COAGULATION/BICAP);  Surgeon: Mauri Pole, MD;  Location: [facility];  Service: Endoscopy;  Laterality: N/A;  . HOT HEMOSTASIS N/A 05/28/2020   Procedure: HOT HEMOSTASIS (ARGON PLASMA COAGULATION/BICAP);  Surgeon: Mauri Pole, MD;  Location: [facility] ENDOSCOPY;  Service: Endoscopy;  Laterality: N/A;  . HOT HEMOSTASIS N/A 06/25/2020   Procedure: HOT HEMOSTASIS (ARGON PLASMA COAGULATION/BICAP);  Surgeon: Milus Banister, MD;  Location: [facility] ENDOSCOPY;  Service: Gastroenterology;  Laterality: N/A;  . INTRAVASCULAR PRESSURE WIRE/FFR STUDY N/A 04/13/2020   Procedure: INTRAVASCULAR PRESSURE WIRE/FFR STUDY;  Surgeon: Burnell Blanks, MD;  Location: [place] CV LAB;  Service: Cardiovascular;  Laterality: N/A;  . IR FLUORO GUIDE CV LINE RIGHT  02/10/2017  . IR REMOVAL TUN CV CATH W/O FL  04/01/2017  . IR US GUIDE VASC ACCESS RIGHT  02/10/2017  . IRRIGATION AND DEBRIDEMENT KNEE Left 03/12/2017   Procedure: IRRIGATION AND DEBRIDEMENT LEFT KNEE WITH WOUND VAC APPLICATION;  Surgeon: Leandrew Koyanagi, MD;  Location: [place];  Service: Orthopedics;  Laterality: Left;  . JOINT REPLACEMENT    . KNEE ARTHROSCOPY Right 08/10/2014   Procedure: ARTHROSCOPY I & D KNEE;  Surgeon: Marianna Payment, MD;  Location: [facility];  Service: Orthopedics;  Laterality: Right;  . KNEE ARTHROSCOPY Left 08/11/2014   Procedure: ARTHROSCOPIC WASHOUT LEFT KNEE;  Surgeon: Marianna Payment, MD;  Location:  [place];  Service: Orthopedics;  Laterality: Left;  . KNEE ARTHROSCOPY Left 08/19/2014   Procedure: ARTHROSCOPIC WASHOUT LEFT KNEE;  Surgeon: Leandrew Koyanagi, MD;  Location: [place];  Service: Orthopedics;  Laterality: Left;  . KNEE ARTHROSCOPY WITH LATERAL MENISECTOMY Left 04/04/2015   Procedure: AND PARTIAL LATERAL MENISECTOMY;  Surgeon: Leandrew Koyanagi, MD;  Location: [place];  Service: Orthopedics;  Laterality: Left;  . KNEE ARTHROSCOPY WITH MEDIAL MENISECTOMY Left 04/04/2015   Procedure: LEFT KNEE ARTHROSCOPY WITH PARTIAL MEDIAL MENISCECTOMY  AND SYNOVECTOMY;  Surgeon: Leandrew Koyanagi, MD;  Location: [place];  Service: Orthopedics;  Laterality: Left;  . PERIPHERAL VASCULAR BALLOON ANGIOPLASTY  07/07/2018   Procedure: PERIPHERAL VASCULAR BALLOON ANGIOPLASTY;  Surgeon: Marty Heck, MD;  Location: [place] [facility];  Service: Cardiovascular;;  right AV fistula  . POLYPECTOMY    . RIGHT/LEFT HEART CATH AND CORONARY ANGIOGRAPHY N/A 04/13/2020   Procedure: RIGHT/LEFT HEART CATH AND CORONARY ANGIOGRAPHY;  Surgeon: Burnell Blanks, MD;  Location: [place] [facility];  Service: Cardiovascular;  Laterality: N/A;  . SHOULDER ARTHROSCOPY Bilateral 08/10/2014  Procedure: I & D BILATERAL SHOULDERS ;  Surgeon: Marianna Payment, MD;  [facility]: [facility];  Service: Orthopedics;  Laterality: Bilateral;  . SMALL INTESTINE SURGERY     Due to Small Bowel Obstruction; "fixed it when they did my gallbladder OR"  . TEE WITHOUT CARDIOVERSION N/A 08/14/2014   Procedure: TRANSESOPHAGEAL ECHOCARDIOGRAM (TEE);  Surgeon: Thayer Headings, MD;  Location: [place];  Service: Cardiovascular;  Laterality: N/A;  . TENOSYNOVECTOMY Right 08/11/2014   Procedure: RIGHT WRIST IRRIGATION AND DEBRIDEMENT, TENOSYNOVECTOMY;  Surgeon: Marianna Payment, MD;  Location: [place];  Service: Orthopedics;  Laterality: Right;  . TOTAL KNEE ARTHROPLASTY Left 11/07/2015   Procedure: LEFT TOTAL KNEE  ARTHROPLASTY WITH REVISION OF IMPLANTS;  Surgeon: Leandrew Koyanagi, MD;  Location: [place];  Service: Orthopedics;  Laterality: Left;  . TUBAL LIGATION      Social History   Socioeconomic History  . Marital status: Single    Spouse name: Not on file  . Number of children: 3  . Years of education: Not on file  . Highest education level: Not on file  Occupational History  . Occupation: disabled  Tobacco Use  . Smoking status: Current Every Day Smoker    Packs/day: 0.50    Years: 45.00    Pack years: 22.50    Types: Cigarettes  . Smokeless tobacco: Never Used  Vaping Use  . Vaping Use: Never used  Substance and Sexual Activity  . Alcohol use: Not Currently    Alcohol/week: 0.0 standard drinks    Comment: 03/10/2017 "I drink a wine cooler a few times/year"  . Drug use: No  . Sexual activity: Not Currently  Other Topics Concern  . Not on file  Social History Narrative   Right handed   Social Determinants of Health   Financial Resource Strain: Not on file  Food Insecurity: Not on file  Transportation Needs: Not on file  Physical Activity: Not on file  Stress: Not on file  Social Connections: Not on file  Intimate Partner Violence: Not on file    Allergies  Allergen Reactions  . Compazine [Prochlorperazine] Shortness Of Breath, Swelling and Other (See Comments)    TONGUE SWELLS  . Shellfish-Derived Products Anaphylaxis  . Iodinated Diagnostic Agents Hives and Rash  . Omnipaque [Iohexol] Hives  . Tape Other (See Comments)    Owens Shark, paper tape caused skin irritation  . Sulfa Antibiotics Rash    Family History  Problem Relation Age of Onset  . Uterine cancer Mother   . Heart disease Father   . Hypertension Sister   . Uterine cancer Sister   . Cancer Brother        unsure type  . Diabetes Brother   . Hypertension Brother   . Colon cancer Neg Hx   . Colon polyps Neg Hx   . Esophageal cancer Neg Hx   . Rectal cancer Neg Hx   . Stomach cancer Neg Hx     Prior to  Admission medications   Medication Sig Start Date End Date Taking? Authorizing Provider  atorvastatin (LIPITOR) 10 MG tablet Take 1 tablet (10 mg total) by mouth at bedtime. 06/13/20  Yes [name], MD  calcium acetate (PHOSLO) 667 MG capsule Take [phone] mg by mouth See admin instructions. Take 1,334 mg by mouth three times a day with meals and 667 mg twice a day with each snack 01/13/19  Yes [provider]  diphenhydrAMINE (BENADRYL) 25 mg capsule Take 25 mg by mouth  every 6 (six) hours as needed for allergies.   Yes [provider]  diphenhydramine-acetaminophen (TYLENOL PM) 25-500 MG TABS tablet Take 1 tablet by mouth at bedtime.   Yes [provider]  doxycycline (VIBRAMYCIN) 100 MG capsule Take 1 capsule (100 mg total) by mouth 2 (two) times daily. 11/08/20  Yes Pattricia Boss, MD  Ensure (ENSURE) Take 237 mLs by mouth daily with breakfast.   Yes [provider]  hydrALAZINE (APRESOLINE) 100 MG tablet Take 100 mg by mouth in the morning and at bedtime.   Yes [provider]  isosorbide mononitrate (IMDUR) 30 MG 24 hr tablet Take 1 tablet (30 mg total) by mouth daily. 07/16/20 10/14/20 Yes Donato Heinz, MD  lidocaine-prilocaine (EMLA) cream Apply 1 application topically Every Tuesday,Thursday,and Saturday with dialysis. 10/25/18  Yes [provider]  metoprolol succinate (TOPROL XL) 25 MG 24 hr tablet Take 1 tablet (25 mg total) by mouth daily. 10/31/20  Yes Donato Heinz, MD  misoprostol (CYTOTEC) 100 MCG tablet TAKE 1 TABLET BY MOUTH EVERY 6 HOURS Patient taking differently: Take 100 mcg by mouth every 6 (six) hours. 10/29/20  Yes Fayrene Helper, MD  multivitamin (RENA-VIT) TABS tablet Take 1 tablet by mouth in the morning. 03/01/19  Yes [provider]  nitroGLYCERIN (NITROSTAT) 0.4 MG SL tablet Place 1 tablet (0.4 mg total) under the tongue every 5 (five) minutes as needed. Patient taking differently:  Place 0.4 mg under the tongue every 5 (five) minutes as needed for chest pain. 04/25/20 07/24/20 Yes Donato Heinz, MD  nortriptyline (PAMELOR) 10 MG capsule Take 3 capsules (30 mg total) by mouth at bedtime. 10/29/20  Yes Pieter Partridge, DO  Accu-Chek Softclix Lancets lancets Check sugars three times a day for E11.65 09/20/18   [name], MD  acetaminophen (TYLENOL) 500 MG tablet Take 1,000 mg by mouth every 6 (six) hours as needed for mild pain.    [provider]  blood glucose meter kit and supplies KIT Dispense based on patient and insurance preference. Use up to four times daily as directed 10/13/19   [name], MD  Blood Glucose Monitoring Suppl (ACCU-CHEK GUIDE) w/Device KIT 1 Device by Does not apply route daily. 10/31/19   [name], MD  diltiazem 2 % GEL Apply 1 application topically 3 (three) times daily. Apply to affected area on anus Patient taking differently: Apply 1 application topically 3 (three) times daily as needed (for irritation on/in the anus). 06/25/20   Samuella Cota, MD  famotidine (PEPCID) 40 MG tablet Take 1 tablet (40 mg total) by mouth daily. Patient not taking: No sig reported 08/24/20   Noralyn Pick, NP  glucose blood (ACCU-CHEK GUIDE) test strip Check blood sugar up to 4 times a day 10/20/19   [name], MD  HYDROcodone-acetaminophen (NORCO) 5-325 MG tablet Take 1 tablet by mouth every 6 (six) hours as needed for moderate pain. 10/31/20   [name], MD  hydrocortisone (ANUSOL-HC) 25 MG suppository Place 1 suppository (25 mg total) rectally 2 (two) times daily. Patient taking differently: Place 25 mg rectally 2 (two) times daily as needed for hemorrhoids or anal itching. 06/25/20   Samuella Cota, MD  Iron, Ferrous Sulfate, 325 (65 Fe) MG TABS Take 325 mg by mouth daily. Note that this medication will turn stool black and may cause constipation. 06/25/20   Samuella Cota, MD  Lancet Devices [facility])  lancets Check sugars TID  for E11.65 01/29/15   Lance Bosch, NP  oxyCODONE-acetaminophen (PERCOCET/ROXICET) 5-325 MG tablet Take 1 tablet by mouth every 6 (six) hours as needed for severe pain. Patient not taking: No sig reported 11/08/20   Pattricia Boss, MD  pantoprazole (PROTONIX) 40 MG tablet Take 1 tablet (40 mg total) by mouth daily. 40 mg BID for 2 months & then once daily Patient taking differently: Take 40 mg by mouth See admin instructions. Take 40 mg by mouth two times a day for 2 months, then decrease to once a day 08/03/20 10/02/20  [name], MD  polyethylene glycol (MIRALAX / GLYCOLAX) 17 g packet Take 17 g by mouth daily as needed for moderate constipation. Patient taking differently: Take 17 g by mouth daily as needed for moderate constipation (MIX AND DRINK). 12/10/18   [name], MD  UNABLE TO FIND Prosthetic socket, from [facility] 08/03/20   [name], MD  traZODone (DESYREL) 50 MG tablet Take 50 mg by mouth at bedtime as needed for sleep. Patient not taking: Reported on 10/01/2020 10/27/18   [provider]    Physical Exam: Vitals:   11/12/2020 1400 11/05/2020 1401 11/10/2020 1409 11/12/2020 1530  BP: 110/65 110/65 110/65 128/70  Pulse:   79 81  Resp: 18  18 (!) 27  Temp:    (!) 93.2 F (34 C)  TempSrc:    Rectal  SpO2:   98%       General:  Appears ill, frail, cachectic, much more chronically ill than on prior admission  Eyes:   EOMI, normal lids, iris  ENT:  grossly normal hearing, lips & tongue, moderately dry mm; edentulous  Neck:  no LAD, masses or thyromegaly  Cardiovascular:  RRR, no r/g, + systolic murmur. No LE edema.   Respiratory:   CTA bilaterally with no wheezes/rales/rhonchi.  Normal respiratory effort.  Abdomen:  soft, diffusely TTP but particularly in epigastric region and LLQ, ND  Skin:  RLE with lichenification and stasis changes; L stump with foul smelling purulent drainage and surrounding  erythema      Musculoskeletal:  grossly normal tone BUE/BLE,  no bony abnormality  Psychiatric:  flat mood and affect, speech fluent but difficult to understand  Neurologic:  CN 2-12 grossly intact, moves all extremities in coordinated fashion    Radiological Exams on Admission: Independently reviewed - see discussion in A/P where applicable  DG Chest 2 View  Result Date: 10/16/2020 CLINICAL DATA:  Shortness of breath.  Difficulty swallowing. EXAM: CHEST - 2 VIEW COMPARISON:  11/08/2020 FINDINGS: Stable enlargement of the cardiac silhouette. Stable densities at left lung base are suggestive for left pleural fluid. Right lung remains clear. No overt pulmonary edema. Trachea is midline. Atherosclerotic calcifications at the aortic arch. IMPRESSION: 1. Persistent left basilar chest densities. Findings are compatible with combination with a small pleural effusion and atelectasis. Minimal change from the previous examination. 2. Stable enlargement of the cardiac silhouette. Electronically Signed   By: Markus Daft M.D.   On: 11/03/2020 12:00   DG Femur Min 2 Views Left  Result Date: 11/05/2020 CLINICAL DATA:  Amputation. EXAM: LEFT FEMUR 2 VIEWS COMPARISON:  11/08/2020. FINDINGS: Prior AKA. Mild skin irregularity about the distal stump again noted. No acute or focal bony abnormality. Peripheral vascular calcification. Surgical clips in the pelvis. Calcified density in pelvis most likely calcified fibroid. IMPRESSION: 1. Prior AKA. Soft tissue irregularity is again noted about distal stump. No acute bony or focal abnormality. 2.  Peripheral vascular disease. Electronically Signed   By: Marcello Moores  Register   On: 10/17/2020 12:01    EKG: Independently reviewed.  NSR with rate 78; low voltage; nonspecific ST changes with no evidence of acute ischemia   Labs on Admission: I have personally reviewed the available labs and imaging studies at the time of the admission.  Pertinent labs:   Na++ 131 K+  5.4 Glucose 113 BUN 57/Creatinie 6.44/GFR 7 Anion gap 16 Ionized calcium 0.82 Lactate 2.6 VBG: 7.4/44.5/27.6 WBC 7.9 Hgb 10.7 Platelets 143 COVID/flu negative   Assessment/Plan Principal Problem:   Sepsis due to undetermined organism [facility]) Active Problems:   End stage renal disease ([name])   Essential hypertension   Dyslipidemia   Amputation stump infection ([place])   Sepsis from amputation site infection -Sepsis indicates life-threatening organ dysfunction with mortality >10%, caused by dysregulation to host response.   -SIRS criteria in this patient includes: Hypothermia, tachypnea, tachycardia  -Patient has evidence of acute organ failure with elevated lactate >2 that is not easily explained by another condition. -While awaiting blood cultures, this appears to be a preseptic condition. -Sepsis protocol initiated -Suspected source is infection of L AKA stump, refractory to outpatient doxy -Blood and urine cultures pending -Will admit due to: hemodynamic instability; failure of outpatient treatment -Treat with IV Cefepime/Vanc/Flagyl for lower extremity wound infection at high risk for Pseudomonas, MRSA -Will trend lactate to ensure improvement -Will order procalcitonin level.   -This patient is at risk for shock and may require vasopressors to keep MAP >65; shock is associated with >40% mortality. -Dr. Sharol Given will consult tomorrow; NPO after MN. -Palliative care consulted.  h/o GI Bleeding, now with dysphagia -Patient with h/o recurrent GI bleeding -Denies current bleeding, Hgb appears to be stable -Continue misoprostol -However, she is complaining of dysphagia -May need further evaluation once more stable from a sepsis standpoint -h/o candidiasis - none seen on initial exam but low threshold for starting treatment  ESRD on TTS HD -Patient on chronic TTS HD -Nephrology prn order set utilized -She will need HD  -Dr. Jonnie Finner is aware that patient will need HD -Continue  Phoslo, Rena-Vit -Mild hyperkalemia, given Lokelma. -Hypocalcemia, treated by EDP.  DM -Last A1c was <5  -She may no longer need to carry this diagnosis -For now, will order very sensitive-scale SSI without qhs coverage  CAD/PVD -s/p L AKA -Continue Imdur  HLD -Continue Lipitor  HTN -Hold BP meds due to borderline low BP in the ER -Home meds are currently hydralazine, Toprol XL  Mood d/o -Continue nortriptyline     Note: This patient has been tested and is negative for the novel coronavirus COVID-19. She has been fully vaccinated against COVID-19.    DVT prophylaxis:  Heparin Code Status:  Full - confirmed with patient Family Communication: None present; I spoke with her son by telephone at the time of admission Disposition Plan:  The patient is from: home  Anticipated d/c is to: home without [facility] services  Anticipated d/c date will depend on clinical response to treatment, likely 2-3 days  Patient is currently: acutely ill Consults called: Nephrology; Orthopedics; Palliative care; PV navigator; nutrition; TOC team  Admission status: Admit - It is my clinical opinion that admission to INPATIENT is reasonable and necessary because of the expectation that this patient will require hospital care that crosses at least 2 midnights to treat this condition based on the medical complexity of the problems presented.  Given the aforementioned information, the predictability of an adverse outcome  is felt to be significant.    Karmen Bongo MD [facility]   How to contact the [facility] Attending or Consulting provider [place] or covering provider during after hours [place], for this patient?  1. Check the care team in [facility] and look for a) attending/consulting TRH provider listed and b) the [facility] team listed 2. Log into [email or web address] and use [place]'s universal password to access. If you do not have the password, please contact the hospital operator. 3. Locate the [facility]  provider you are looking for under [facility] and page to a number that you can be directly reached. 4. If you still have difficulty reaching the provider, please page the [facility] (Director on Call) for the Hospitalists listed on amion for assistance.   10/28/2020, 5:04 PM

## 2020-11-13 NOTE — Telephone Encounter (Signed)
Received a call from [facility], they are needing a diagnosis as to why pt is taking HYDROcodone-acetaminophen (NORCO) 5-325 MG tablet

## 2020-11-13 NOTE — Telephone Encounter (Signed)
Spoke with patients daughter, patient did go to her dialysis appointment on Saturday but did NOT go to her dialysis today. Had a small meal on Saturday, nothing to eat on Sunday besides 2 ensures , and on Monday she had a spoonful of oatmeal nothing else. Patient has been acting confused, started about 2 weeks ago. Daughter thinks patient is taking medications incorrectly or not taking them at all , she is taking her pain medications regularly though. Patients children have removed pain medication from her room to avoid misuse of medication. Patient does not have a renal nutritionist, but drinks the ensures that are for her kidneys. Daughter states patient has lost a lot of weight, looks lethargic and malnourished. Would like a referral to home health if possible, patient also has an appointment scheduled next week with Dr.Andy.

## 2020-11-13 NOTE — Sepsis Progress Note (Signed)
Notified bedside nurse of need to draw repeat lactic acid. 

## 2020-11-13 NOTE — Progress Notes (Signed)
Elink Code Sepsis Completion Note:  LA down to 2.0. Had approx 1400 ml IVF replacement. ABX administered after BC drawn. To be admitted to a  Progressive unit for further monitoring.   Karmen Altamirano DNP [place] RN 22:02 PM

## 2020-11-13 NOTE — ED Notes (Signed)
Pt given chicken broth and saltine crackers

## 2020-11-13 NOTE — ED Notes (Signed)
Unable to pick up a pulse ox read. Switched out several different types and placed to several areas with no read

## 2020-11-13 NOTE — ED Provider Notes (Signed)
Emergency Medicine Provider Triage Evaluation Note  Brandy Houston , a 65 y.o. female  was evaluated in triage.  Pt complains of throat pain and painful swallowing.  Infection to left upper leg amputation site.  Review of Systems  Positive: Painful swallowing, difficulty breathing, infection to the leg Negative: Fever, cough, chest pain  Physical Exam  BP (!) 134/115 (BP Location: Left Arm)   Pulse (!) 116   Temp (!) 95.5 F (35.3 C)   Resp (!) 28   LMP 03/16/2002   SpO2 (!) 87%   Gen:   Awake, no distress   Resp:  Normal effort  MSK:   Moves extremities without difficulty  Other:  'At times, patient seems drowsy, but then is awake, alert, oriented.  Medical Decision Making  Medically screening exam initiated at 11:03 AM.  Appropriate orders placed.  Brandy Houston was informed that the remainder of the evaluation will be completed by another provider, this initial triage assessment does not replace that evaluation, and the importance of remaining in the ED until their evaluation is complete.     Lorayne Bender, PA-C 10/24/2020 1153    Isla Pence, MD 11/05/2020 [phone]

## 2020-11-13 NOTE — ED Notes (Signed)
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  Patient complaining she is hot and is also refusing to take any more pills due to pain in her mouth/throat/chest

## 2020-11-13 NOTE — ED Provider Notes (Signed)
[facility] EMERGENCY DEPARTMENT Provider Note   CSN: [id number] Arrival date & time: 10/19/2020  1042     History No chief complaint on file.   Brandy Houston is a 65 y.o. female.  Pt presents to the ED today with difficulty swallowing and pills getting stuck.  She is able to drink water.  The pt also c/o an infection at the left AKA stump site.  Pt is a dialysis patient and was supposed to go to dialysis today.  Last dialysis was 5/28.  Pt feels sob as well.  No fevers.  She did have Covid a few weeks ago.  She is still on doxy.  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 Sharol Given.  She saw Dr. Jess Barters PA on 5/27 and plan was for silvadene and dressing changes.  The PA also ordered home health, but that has not been set up yet.  Pt said her son lives with her and helps her out.  Her daughter lives 1.5 hrs away, but checks on her mother frequently.  Pt has not changed the dressing since then and there is now some discharge and odor.  There are notes in the computer which show that pt has not been wanting to eat or drink much.        Past Medical History:  Diagnosis Date  . Allergy   . Anemia   . Anxiety   . Arthritis    "in my joints" (03/10/2017)  . Chronic diastolic CHF (congestive heart failure) ([place])   . Depression    Chronic  . Diabetic peripheral neuropathy ([place])   . Dysrhythmia    tachycardia, normal ECHO 08-09-14  . ESRD (end stage renal disease) ([place])   . GERD (gastroesophageal reflux disease)   . Heart murmur     dx'd 02/05/2017  . Hepatitis C    "tx'd in 2016; I'm negative now" (02/05/2017)  . History of blood transfusion 2017   "w/knee replacement"  . Hyperlipidemia   . Hypertension   . Peripheral neuropathy   . Protein calorie malnutrition ([place])   . Septic arthritis ([place])   . Slow transit constipation   . Symptomatic anemia 01/27/2020  . Type II diabetes mellitus ([name])    IDDM  . Uncontrolled hypertension 02/18/2013    Patient Active Problem List   Diagnosis  Date Noted  . Acute upper GI bleeding 09/08/2020  . Dyslipidemia 09/08/2020  . Esophageal candidiasis ([place]) 06/25/2020  . Intestinal angiodysplasia   . GIB (gastrointestinal bleeding) 06/21/2020  . Lab test positive for detection of COVID-19 virus 06/21/2020  . Rectal bleeding 06/20/2020  . Coronary artery disease of native artery of native heart with stable angina pectoris ([place]) 06/13/2020  . Nonrheumatic aortic valve insufficiency 06/13/2020  . Nonrheumatic tricuspid valve regurgitation 06/13/2020  . GAVE (gastric antral vascular ectasia)   . AVM (arteriovenous malformation) of small bowel, acquired with hemorrhage   . Hip swelling, right 05/27/2020  . Hypokalemia 05/27/2020  . Anemia of chronic renal failure 05/27/2020  . 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 Acute combined systolic and diastolic heart failure ([place])   . Unstable angina ([place])   . Symptomatic anemia 01/27/2020  . Hypercalcemia 10/06/2019  . Moderate protein-calorie malnutrition ([place]) 05/11/2018  . End stage renal disease ([place]) 04/19/2018  . Iron deficiency anemia, unspecified 04/19/2018  . Nephrotic syndrome with unspecified morphologic changes 04/19/2018  . Secondary hyperparathyroidism of renal origin ([place]) 04/19/2018  . Nicotine dependence, cigarettes,  uncomplicated 32/67/1245  . Essential hypertension   . Amputation above knee ([place])   . ABLA (acute blood loss anemia)   . DM type 2 causing ESRD ([place]) 03/21/2017  . Anemia in chronic kidney disease 02/05/2017  . Chronic diastolic CHF (congestive heart failure) ([place]) 11/08/2015  . Diabetic peripheral neuropathy ([place] [address])   . Major depression, recurrent, chronic ([place])   . Chronic kidney disease (CKD), stage IV (severe) ([place])   . Hepatitis C 08/16/2014  . Peripheral neuropathy 06/24/2013    Past Surgical History:  Procedure Laterality Date  . A/V FISTULAGRAM Right 07/07/2018   Procedure: A/V FISTULAGRAM;  Surgeon: Marty Heck, MD;  Location: [facility] CV LAB;  Service: Cardiovascular;  Laterality: Right;  . AMPUTATION Left 03/20/2017   Procedure: LEFT ABOVE KNEE AMPUTATION;  Surgeon: Newt Minion, MD;  Location: [facility];  Service: Orthopedics;  Laterality: Left;  . APPENDECTOMY    . AV FISTULA PLACEMENT Left 09/13/2014   Procedure: Brachial Artery to Brachial Vein Gortex Four - Seven Stretch GRAFT INSERTION Left Forearm;  Surgeon: Mal Misty, MD;  Location: [facility];  Service: Vascular;  Laterality: Left;  . BASCILIC VEIN TRANSPOSITION Right 10/26/2017   Procedure: BASILIC VEIN TRANSPOSITION FIRST STAGE RIGHT ARM;  Surgeon: Conrad [place] Forest, MD;  Location: [facility];  Service: Vascular;  Laterality: Right;  . BASCILIC VEIN TRANSPOSITION Right 02/01/2018   Procedure: SECOND STAGE BASILIC VEIN TRANSPOSITION RIGHT UPPER EXTREMITY;  Surgeon: Marty Heck, MD;  Location: [facility];  Service: Vascular;  Laterality: Right;  . CHOLECYSTECTOMY OPEN    . COLON SURGERY    . COLONOSCOPY    . ENTEROSCOPY N/A 05/28/2020   Procedure: ENTEROSCOPY;  Surgeon: Mauri Pole, MD;  Location: [facility] ENDOSCOPY;  Service: Endoscopy;  Laterality: N/A;  . ENTEROSCOPY N/A 06/25/2020   Procedure: ENTEROSCOPY;  Surgeon: Milus Banister, MD;  Location: [facility] ENDOSCOPY;  Service: Gastroenterology;  Laterality: N/A;  . ESOPHAGOGASTRODUODENOSCOPY (EGD) WITH PROPOFOL N/A 05/14/2020   Procedure: ESOPHAGOGASTRODUODENOSCOPY (EGD) WITH PROPOFOL;  Surgeon: Mauri Pole, MD;  Location: [facility] ENDOSCOPY;  Service: Endoscopy;  Laterality: N/A;  with APC  . EXCISIONAL TOTAL KNEE ARTHROPLASTY WITH ANTIBIOTIC SPACERS Left 02/06/2017   Procedure: Incisional total iknee with antibiotic spacer ;  Surgeon: Leandrew Koyanagi, MD;  Location: [facility];  Service: Orthopedics;  Laterality: Left;  . GIVENS CAPSULE STUDY N/A 09/09/2020   Procedure: GIVENS CAPSULE STUDY;  Surgeon: Jackquline Denmark, MD;  Location: [facility] ENDOSCOPY;  Service: Endoscopy;  Laterality: N/A;  . HOT HEMOSTASIS  N/A 05/14/2020   Procedure: HOT HEMOSTASIS (ARGON PLASMA COAGULATION/BICAP);  Surgeon: Mauri Pole, MD;  Location: [facility];  Service: Endoscopy;  Laterality: N/A;  . HOT HEMOSTASIS N/A 05/28/2020   Procedure: HOT HEMOSTASIS (ARGON PLASMA COAGULATION/BICAP);  Surgeon: Mauri Pole, MD;  Location: [facility] ENDOSCOPY;  Service: Endoscopy;  Laterality: N/A;  . HOT HEMOSTASIS N/A 06/25/2020   Procedure: HOT HEMOSTASIS (ARGON PLASMA COAGULATION/BICAP);  Surgeon: Milus Banister, MD;  Location: [facility] ENDOSCOPY;  Service: Gastroenterology;  Laterality: N/A;  . INTRAVASCULAR PRESSURE WIRE/FFR STUDY N/A 04/13/2020   Procedure: INTRAVASCULAR PRESSURE WIRE/FFR STUDY;  Surgeon: Burnell Blanks, MD;  Location: [facility] CV LAB;  Service: Cardiovascular;  Laterality: N/A;  . IR FLUORO GUIDE CV LINE RIGHT  02/10/2017  . IR REMOVAL TUN CV CATH W/O FL  04/01/2017  . IR US GUIDE VASC ACCESS RIGHT  02/10/2017  . IRRIGATION AND DEBRIDEMENT KNEE Left 03/12/2017   Procedure: IRRIGATION AND DEBRIDEMENT  LEFT KNEE WITH WOUND VAC APPLICATION;  Surgeon: Leandrew Koyanagi, MD;  Location: [facility];  Service: Orthopedics;  Laterality: Left;  . JOINT REPLACEMENT    . KNEE ARTHROSCOPY Right 08/10/2014   Procedure: ARTHROSCOPY I & D KNEE;  Surgeon: Marianna Payment, MD;  Location: [facility];  Service: Orthopedics;  Laterality: Right;  . KNEE ARTHROSCOPY Left 08/11/2014   Procedure: ARTHROSCOPIC WASHOUT LEFT KNEE;  Surgeon: Marianna Payment, MD;  Location: [facility];  Service: Orthopedics;  Laterality: Left;  . KNEE ARTHROSCOPY Left 08/19/2014   Procedure: ARTHROSCOPIC WASHOUT LEFT KNEE;  Surgeon: Leandrew Koyanagi, MD;  Location: [facility];  Service: Orthopedics;  Laterality: Left;  . KNEE ARTHROSCOPY WITH LATERAL MENISECTOMY Left 04/04/2015   Procedure: AND PARTIAL LATERAL MENISECTOMY;  Surgeon: Leandrew Koyanagi, MD;  Location: [facility];  Service: Orthopedics;  Laterality: Left;  . KNEE ARTHROSCOPY WITH MEDIAL  MENISECTOMY Left 04/04/2015   Procedure: LEFT KNEE ARTHROSCOPY WITH PARTIAL MEDIAL MENISCECTOMY  AND SYNOVECTOMY;  Surgeon: Leandrew Koyanagi, MD;  Location: [facility];  Service: Orthopedics;  Laterality: Left;  . PERIPHERAL VASCULAR BALLOON ANGIOPLASTY  07/07/2018   Procedure: PERIPHERAL VASCULAR BALLOON ANGIOPLASTY;  Surgeon: Marty Heck, MD;  Location: [facility] CV LAB;  Service: Cardiovascular;;  right AV fistula  . POLYPECTOMY    . RIGHT/LEFT HEART CATH AND CORONARY ANGIOGRAPHY N/A 04/13/2020   Procedure: RIGHT/LEFT HEART CATH AND CORONARY ANGIOGRAPHY;  Surgeon: Burnell Blanks, MD;  Location: [facility] CV LAB;  Service: Cardiovascular;  Laterality: N/A;  . SHOULDER ARTHROSCOPY Bilateral 08/10/2014   Procedure: I & D BILATERAL SHOULDERS ;  Surgeon: Marianna Payment, MD;  Location: [facility];  Service: Orthopedics;  Laterality: Bilateral;  . SMALL INTESTINE SURGERY     Due to Small Bowel Obstruction; "fixed it when they did my gallbladder OR"  . TEE WITHOUT CARDIOVERSION N/A 08/14/2014   Procedure: TRANSESOPHAGEAL ECHOCARDIOGRAM (TEE);  Surgeon: Thayer Headings, MD;  Location: [facility];  Service: Cardiovascular;  Laterality: N/A;  . TENOSYNOVECTOMY Right 08/11/2014   Procedure: RIGHT WRIST IRRIGATION AND DEBRIDEMENT, TENOSYNOVECTOMY;  Surgeon: Marianna Payment, MD;  Location: [facility];  Service: Orthopedics;  Laterality: Right;  . TOTAL KNEE ARTHROPLASTY Left 11/07/2015   Procedure: LEFT TOTAL KNEE ARTHROPLASTY WITH REVISION OF IMPLANTS;  Surgeon: Leandrew Koyanagi, MD;  Location: [facility];  Service: Orthopedics;  Laterality: Left;  . TUBAL LIGATION       OB History    Gravida  5   Para  0   Term  0   Preterm      AB  2   Living  3     SAB  1   IAB  1   Ectopic      Multiple      Live Births              Family History  Problem Relation Age of Onset  . Uterine cancer Mother   . Heart disease Father   . Hypertension Sister   . Uterine  cancer Sister   . Cancer Brother        unsure type  . Diabetes Brother   . Hypertension Brother   . Colon cancer Neg Hx   . Colon polyps Neg Hx   . Esophageal cancer Neg Hx   . Rectal cancer Neg Hx   . Stomach cancer Neg Hx     Social History   Tobacco Use  . Smoking  status: Current Every Day Smoker    Packs/day: 0.50    Years: 45.00    Pack years: 22.50    Types: Cigarettes  . Smokeless tobacco: Never Used  Vaping Use  . Vaping Use: Never used  Substance Use Topics  . Alcohol use: Not Currently    Alcohol/week: 0.0 standard drinks    Comment: 03/10/2017 "I drink a wine cooler a few times/year"  . Drug use: No    Home Medications Prior to Admission medications   Medication Sig Start Date End Date Taking? Authorizing Provider  atorvastatin (LIPITOR) 10 MG tablet Take 1 tablet (10 mg total) by mouth at bedtime. 06/13/20  Yes [name], MD  calcium acetate (PHOSLO) 667 MG capsule Take [phone] mg by mouth See admin instructions. Take 1,334 mg by mouth three times a day with meals and 667 mg twice a day with each snack 01/13/19  Yes [provider]  diphenhydrAMINE (BENADRYL) 25 mg capsule Take 25 mg by mouth every 6 (six) hours as needed for allergies.   Yes [provider]  diphenhydramine-acetaminophen (TYLENOL PM) 25-500 MG TABS tablet Take 1 tablet by mouth at bedtime.   Yes [provider]  doxycycline (VIBRAMYCIN) 100 MG capsule Take 1 capsule (100 mg total) by mouth 2 (two) times daily. 11/08/20  Yes Pattricia Boss, MD  Ensure (ENSURE) Take 237 mLs by mouth daily with breakfast.   Yes [provider]  hydrALAZINE (APRESOLINE) 100 MG tablet Take 100 mg by mouth in the morning and at bedtime.   Yes [provider]  isosorbide mononitrate (IMDUR) 30 MG 24 hr tablet Take 1 tablet (30 mg total) by mouth daily. 07/16/20 10/14/20 Yes Donato Heinz, MD  lidocaine-prilocaine (EMLA) cream Apply 1 application topically Every  Tuesday,Thursday,and Saturday with dialysis. 10/25/18  Yes [provider]  metoprolol succinate (TOPROL XL) 25 MG 24 hr tablet Take 1 tablet (25 mg total) by mouth daily. 10/31/20  Yes Donato Heinz, MD  misoprostol (CYTOTEC) 100 MCG tablet TAKE 1 TABLET BY MOUTH EVERY 6 HOURS Patient taking differently: Take 100 mcg by mouth every 6 (six) hours. 10/29/20  Yes Fayrene Helper, MD  multivitamin (RENA-VIT) TABS tablet Take 1 tablet by mouth in the morning. 03/01/19  Yes [provider]  nitroGLYCERIN (NITROSTAT) 0.4 MG SL tablet Place 1 tablet (0.4 mg total) under the tongue every 5 (five) minutes as needed. Patient taking differently: Place 0.4 mg under the tongue every 5 (five) minutes as needed for chest pain. 04/25/20 07/24/20 Yes Donato Heinz, MD  nortriptyline (PAMELOR) 10 MG capsule Take 3 capsules (30 mg total) by mouth at bedtime. 10/29/20  Yes Pieter Partridge, DO  Accu-Chek Softclix Lancets lancets Check sugars three times a day for E11.65 09/20/18   [name], MD  acetaminophen (TYLENOL) 500 MG tablet Take 1,000 mg by mouth every 6 (six) hours as needed for mild pain.    [provider]  blood glucose meter kit and supplies KIT Dispense based on patient and insurance preference. Use up to four times daily as directed 10/13/19   [name], MD  Blood Glucose Monitoring Suppl (ACCU-CHEK GUIDE) w/Device KIT 1 Device by Does not apply route daily. 10/31/19   [name], MD  diltiazem 2 % GEL Apply 1 application topically 3 (three) times daily. Apply to affected area on anus Patient taking differently: Apply 1 application topically 3 (three) times daily as needed (for irritation on/in the anus).  06/25/20   Samuella Cota, MD  famotidine (PEPCID) 40 MG tablet Take 1 tablet (40 mg total) by mouth daily. Patient not taking: No sig reported 08/24/20   Noralyn Pick, NP  glucose blood (ACCU-CHEK GUIDE) test strip Check blood sugar up  to 4 times a day 10/20/19   [name], MD  HYDROcodone-acetaminophen (NORCO) 5-325 MG tablet Take 1 tablet by mouth every 6 (six) hours as needed for moderate pain. 10/31/20   [name], MD  hydrocortisone (ANUSOL-HC) 25 MG suppository Place 1 suppository (25 mg total) rectally 2 (two) times daily. Patient taking differently: Place 25 mg rectally 2 (two) times daily as needed for hemorrhoids or anal itching. 06/25/20   Samuella Cota, MD  Iron, Ferrous Sulfate, 325 (65 Fe) MG TABS Take 325 mg by mouth daily. Note that this medication will turn stool black and may cause constipation. 06/25/20   Samuella Cota, MD  Lancet Devices [facility]) lancets Check sugars TID for E11.65 01/29/15   Lance Bosch, NP  oxyCODONE-acetaminophen (PERCOCET/ROXICET) 5-325 MG tablet Take 1 tablet by mouth every 6 (six) hours as needed for severe pain. Patient not taking: No sig reported 11/08/20   Pattricia Boss, MD  pantoprazole (PROTONIX) 40 MG tablet Take 1 tablet (40 mg total) by mouth daily. 40 mg BID for 2 months & then once daily Patient taking differently: Take 40 mg by mouth See admin instructions. Take 40 mg by mouth two times a day for 2 months, then decrease to once a day 08/03/20 10/02/20  [name], MD  polyethylene glycol (MIRALAX / GLYCOLAX) 17 g packet Take 17 g by mouth daily as needed for moderate constipation. Patient taking differently: Take 17 g by mouth daily as needed for moderate constipation (MIX AND DRINK). 12/10/18   [name], MD  UNABLE TO FIND Prosthetic socket, from [facility] 08/03/20   [name], MD  traZODone (DESYREL) 50 MG tablet Take 50 mg by mouth at bedtime as needed for sleep. Patient not taking: Reported on 10/01/2020 10/27/18   [provider]    Allergies    Compazine [prochlorperazine], Shellfish-derived products, Iodinated diagnostic agents, Omnipaque [iohexol], Tape, and Sulfa antibiotics  Review of Systems   Review of  Systems  Respiratory: Positive for shortness of breath.   Gastrointestinal:       Difficulty swallowing  Skin: Positive for wound.  All other systems reviewed and are negative.   Physical Exam Updated Vital Signs BP 128/70   Pulse 81   Temp (!) 93.2 F (34 C) (Rectal)   Resp (!) 27   LMP 03/16/2002   SpO2 98%   Physical Exam Vitals and nursing note reviewed.  Constitutional:      Appearance: She is underweight.  HENT:     Head: Normocephalic and atraumatic.     Right Ear: External ear normal.     Left Ear: External ear normal.     Nose: Nose normal.     Mouth/Throat:     Mouth: Mucous membranes are dry.  Cardiovascular:     Rate and Rhythm: Regular rhythm. Tachycardia present.     Pulses: Normal pulses.     Heart sounds: Normal heart sounds.  Pulmonary:     Effort: Pulmonary effort is normal.     Breath sounds: Normal breath sounds.  Abdominal:     General: Abdomen is flat. Bowel sounds are normal.     Palpations: Abdomen is soft.  Musculoskeletal:  Cervical back: Normal range of motion.     Comments: Left AKA noted.  Wound with dehiscence and drainage.  See picture.  Skin:    Capillary Refill: Capillary refill takes less than 2 seconds.  Neurological:     General: No focal deficit present.     Mental Status: She is alert and oriented to person, place, and time.  Psychiatric:        Mood and Affect: Mood normal.       ED Results / Procedures / Treatments   Labs (all labs ordered are listed, but only abnormal results are displayed) Labs Reviewed  BASIC METABOLIC PANEL - Abnormal; Notable for the following components:      Result Value   Sodium 131 (*)    Potassium 5.4 (*)    Chloride 94 (*)    CO2 21 (*)    Glucose, Bld 113 (*)    BUN 57 (*)    Creatinine, Ser 6.44 (*)    Calcium 6.4 (*)    GFR, Estimated 7 (*)    Anion gap 16 (*)    All other components within normal limits  CBC WITH DIFFERENTIAL/PLATELET - Abnormal; Notable for the following  components:   RBC 3.86 (*)    Hemoglobin 10.7 (*)    HCT 33.8 (*)    RDW 18.9 (*)    Platelets 143 (*)    nRBC 0.4 (*)    Lymphs Abs 0.1 (*)    All other components within normal limits  LACTIC ACID, PLASMA - Abnormal; Notable for the following components:   Lactic Acid, Venous 2.6 (*)    All other components within normal limits  I-STAT VENOUS BLOOD GAS, ED - Abnormal; Notable for the following components:   pO2, Ven 52.0 (*)    Sodium 131 (*)    Potassium 5.2 (*)    Calcium, Ion 0.82 (*)    All other components within normal limits  RESP PANEL BY RT-PCR (FLU A&B, COVID) ARPGX2  CULTURE, BLOOD (ROUTINE X 2)  CULTURE, BLOOD (ROUTINE X 2)  CULTURE, BLOOD (ROUTINE X 2)  CULTURE, BLOOD (ROUTINE X 2)  LACTIC ACID, PLASMA  BLOOD GAS, ARTERIAL  PROTIME-INR    EKG None  Radiology DG Chest 2 View  Result Date: 11/01/2020 CLINICAL DATA:  Shortness of breath.  Difficulty swallowing. EXAM: CHEST - 2 VIEW COMPARISON:  11/08/2020 FINDINGS: Stable enlargement of the cardiac silhouette. Stable densities at left lung base are suggestive for left pleural fluid. Right lung remains clear. No overt pulmonary edema. Trachea is midline. Atherosclerotic calcifications at the aortic arch. IMPRESSION: 1. Persistent left basilar chest densities. Findings are compatible with combination with a small pleural effusion and atelectasis. Minimal change from the previous examination. 2. Stable enlargement of the cardiac silhouette. Electronically Signed   By: Markus Daft M.D.   On: 11/11/2020 12:00   DG Femur Min 2 Views Left  Result Date: 11/11/2020 CLINICAL DATA:  Amputation. EXAM: LEFT FEMUR 2 VIEWS COMPARISON:  11/08/2020. FINDINGS: Prior AKA. Mild skin irregularity about the distal stump again noted. No acute or focal bony abnormality. Peripheral vascular calcification. Surgical clips in the pelvis. Calcified density in pelvis most likely calcified fibroid. IMPRESSION: 1. Prior AKA. Soft tissue irregularity  is again noted about distal stump. No acute bony or focal abnormality. 2.  Peripheral vascular disease. Electronically Signed   By: Marcello Moores  Register   On: 10/22/2020 12:01    Procedures Procedures   Medications Ordered in ED Medications  lactated  ringers infusion (has no administration in time range)  lactated ringers bolus 1,000 mL (has no administration in time range)  cefTRIAXone (ROCEPHIN) 2 g in sodium chloride 0.9 % 100 mL IVPB (has no administration in time range)  lidocaine (XYLOCAINE) 2 % viscous mouth solution 15 mL (15 mLs Mouth/Throat Given 11/11/2020 1347)  calcium gluconate 1 g/ 50 mL sodium chloride IVPB (0 g Intravenous Stopped 10/27/2020 1449)  silver sulfADIAZINE (SILVADENE) 1 % cream ( Topical Given 11/02/2020 1351)  sodium zirconium cyclosilicate (LOKELMA) packet 5 g (5 g Oral Given 10/16/2020 1549)    ED Course  I have reviewed the triage vital signs and the nursing notes.  Pertinent labs & imaging results that were available during my care of the patient were reviewed by me and considered in my medical decision making (see chart for details).    MDM Rules/Calculators/A&P                           Pt is hypocalcemic.  She is given 1 g calcium gluconate.  K is slightly elevated with a K of 5.4.  Pt given a dose of Lokelma.    Pt does have some dysphagia, but is able to swallow water.  She is to f/u with GI.  She was supposed to have seen Dr. Silverio Decamp on 5/19, but did not make that appointment because she was in the hospital.  She does not know if it was rescheduled.  Pt is hypothermic.  She meets criteria for sepsis, so a code sepsis was called.  She was given vanc and rocephin for her leg wound.  I spoke with Dr. Sharol Given who will see her in consult tomorrow.  Pt d/w Dr. Lorin Mercy (triad) for admission.  CRITICAL CARE Performed by: Isla Pence   Total critical care time: 30 minutes  Critical care time was exclusive of separately billable procedures and treating other  patients.  Critical care was necessary to treat or prevent imminent or life-threatening deterioration.  Critical care was time spent personally by me on the following activities: development of treatment plan with patient and/or surrogate as well as nursing, discussions with consultants, evaluation of patient's response to treatment, examination of patient, obtaining history from patient or surrogate, ordering and performing treatments and interventions, ordering and review of laboratory studies, ordering and review of radiographic studies, pulse oximetry and re-evaluation of patient's condition.  MAYDELIN DEMING was evaluated in Emergency Department on 11/02/2020 for the symptoms described in the history of present illness. She was evaluated in the context of the global COVID-19 pandemic, which necessitated consideration that the patient might be at risk for infection with the SARS-CoV-2 virus that causes COVID-19. Institutional protocols and algorithms that pertain to the evaluation of patients at risk for COVID-19 are in a state of rapid change based on information released by regulatory bodies including the CDC and federal and state organizations. These policies and algorithms were followed during the patient's care in the ED.  Final Clinical Impression(s) / ED Diagnoses Final diagnoses:  Hypocalcemia  ESRD on hemodialysis ([place])  Wound dehiscence  Hyperkalemia  Dysphagia, unspecified type    Rx / DC Orders ED Discharge Orders    None       Isla Pence, MD 10/24/2020 1641

## 2020-11-14 ENCOUNTER — Inpatient Hospital Stay (HOSPITAL_COMMUNITY): Payer: Medicare Other | Admitting: Certified Registered Nurse Anesthetist

## 2020-11-14 ENCOUNTER — Encounter (HOSPITAL_COMMUNITY): Payer: Self-pay | Admitting: Internal Medicine

## 2020-11-14 ENCOUNTER — Other Ambulatory Visit: Payer: Self-pay

## 2020-11-14 ENCOUNTER — Encounter (HOSPITAL_COMMUNITY): Admission: EM | Disposition: E | Payer: Self-pay | Source: Home / Self Care | Attending: Internal Medicine

## 2020-11-14 ENCOUNTER — Inpatient Hospital Stay (HOSPITAL_COMMUNITY): Payer: Medicare Other

## 2020-11-14 DIAGNOSIS — Z79899 Other long term (current) drug therapy: Secondary | ICD-10-CM

## 2020-11-14 DIAGNOSIS — S78119A Complete traumatic amputation at level between unspecified hip and knee, initial encounter: Secondary | ICD-10-CM

## 2020-11-14 DIAGNOSIS — N186 End stage renal disease: Secondary | ICD-10-CM | POA: Diagnosis not present

## 2020-11-14 DIAGNOSIS — L02416 Cutaneous abscess of left lower limb: Secondary | ICD-10-CM | POA: Diagnosis not present

## 2020-11-14 DIAGNOSIS — A419 Sepsis, unspecified organism: Secondary | ICD-10-CM

## 2020-11-14 DIAGNOSIS — R6521 Severe sepsis with septic shock: Secondary | ICD-10-CM | POA: Diagnosis not present

## 2020-11-14 DIAGNOSIS — Z992 Dependence on renal dialysis: Secondary | ICD-10-CM

## 2020-11-14 DIAGNOSIS — T8744 Infection of amputation stump, left lower extremity: Secondary | ICD-10-CM | POA: Diagnosis not present

## 2020-11-14 DIAGNOSIS — E162 Hypoglycemia, unspecified: Secondary | ICD-10-CM

## 2020-11-14 DIAGNOSIS — T8130XA Disruption of wound, unspecified, initial encounter: Secondary | ICD-10-CM

## 2020-11-14 DIAGNOSIS — E440 Moderate protein-calorie malnutrition: Secondary | ICD-10-CM | POA: Insufficient documentation

## 2020-11-14 DIAGNOSIS — E43 Unspecified severe protein-calorie malnutrition: Secondary | ICD-10-CM

## 2020-11-14 DIAGNOSIS — G9341 Metabolic encephalopathy: Secondary | ICD-10-CM

## 2020-11-14 HISTORY — PX: AMPUTATION: SHX166

## 2020-11-14 HISTORY — PX: APPLICATION OF WOUND VAC: SHX5189

## 2020-11-14 LAB — TROPONIN I (HIGH SENSITIVITY)
Troponin I (High Sensitivity): 31 ng/L — ABNORMAL HIGH (ref <18)
Troponin I (High Sensitivity): 36 ng/L — ABNORMAL HIGH (ref <18)

## 2020-11-14 LAB — GLUCOSE, CAPILLARY
Glucose-Capillary: 50 mg/dL — ABNORMAL LOW (ref 70–99)
Glucose-Capillary: 168 mg/dL — ABNORMAL HIGH (ref 70–99)
Glucose-Capillary: 79 mg/dL (ref 70–99)
Glucose-Capillary: 43 mg/dL — CL (ref 70–99)
Glucose-Capillary: 49 mg/dL — ABNORMAL LOW (ref 70–99)
Glucose-Capillary: 63 mg/dL — ABNORMAL LOW (ref 70–99)
Glucose-Capillary: 69 mg/dL — ABNORMAL LOW (ref 70–99)
Glucose-Capillary: <10 mg/dL — CL (ref 70–99)
Glucose-Capillary: <10 mg/dL — CL (ref 70–99)
Glucose-Capillary: 352 mg/dL — ABNORMAL HIGH (ref 70–99)
Glucose-Capillary: 108 mg/dL — ABNORMAL HIGH (ref 70–99)

## 2020-11-14 LAB — COMPREHENSIVE METABOLIC PANEL
ALT: 49 U/L — ABNORMAL HIGH (ref 0–44)
AST: 43 U/L — ABNORMAL HIGH (ref 15–41)
Albumin: 1.2 g/dL — ABNORMAL LOW (ref 3.5–5.0)
Alkaline Phosphatase: 216 U/L — ABNORMAL HIGH (ref 38–126)
Anion gap: 15 (ref 5–15)
BUN: 60 mg/dL — ABNORMAL HIGH (ref 8–23)
CO2: 21 mmol/L — ABNORMAL LOW (ref 22–32)
Calcium: 6.3 mg/dL — CL (ref 8.9–10.3)
Chloride: 95 mmol/L — ABNORMAL LOW (ref 98–111)
Creatinine, Ser: 6.27 mg/dL — ABNORMAL HIGH (ref 0.44–1.00)
GFR, Estimated: 7 mL/min — ABNORMAL LOW (ref >60)
Glucose, Bld: 163 mg/dL — ABNORMAL HIGH (ref 70–99)
Potassium: 5.6 mmol/L — ABNORMAL HIGH (ref 3.5–5.1)
Sodium: 131 mmol/L — ABNORMAL LOW (ref 135–145)
Total Bilirubin: 0.9 mg/dL (ref 0.3–1.2)
Total Protein: 3.9 g/dL — ABNORMAL LOW (ref 6.5–8.1)

## 2020-11-14 LAB — CBC
HCT: 28.1 % — ABNORMAL LOW (ref 36.0–46.0)
HCT: 34.8 % — ABNORMAL LOW (ref 36.0–46.0)
Hemoglobin: 9.2 g/dL — ABNORMAL LOW (ref 12.0–15.0)
Hemoglobin: 11.1 g/dL — ABNORMAL LOW (ref 12.0–15.0)
MCH: 27.6 pg (ref 26.0–34.0)
MCH: 27.7 pg (ref 26.0–34.0)
MCHC: 32.7 g/dL (ref 30.0–36.0)
MCHC: 31.9 g/dL (ref 30.0–36.0)
MCV: 84.4 fL (ref 80.0–100.0)
MCV: 86.8 fL (ref 80.0–100.0)
Platelets: 116 10*3/uL — ABNORMAL LOW (ref 150–400)
Platelets: UNDETERMINED 10*3/uL (ref 150–400)
RBC: 3.33 MIL/uL — ABNORMAL LOW (ref 3.87–5.11)
RBC: 4.01 MIL/uL (ref 3.87–5.11)
RDW: 18.2 % — ABNORMAL HIGH (ref 11.5–15.5)
RDW: 18.2 % — ABNORMAL HIGH (ref 11.5–15.5)
WBC: 7.1 10*3/uL (ref 4.0–10.5)
WBC: 7.5 10*3/uL (ref 4.0–10.5)
nRBC: 0.6 % — ABNORMAL HIGH (ref 0.0–0.2)
nRBC: 2 % — ABNORMAL HIGH (ref 0.0–0.2)

## 2020-11-14 LAB — BASIC METABOLIC PANEL
Anion gap: 17 — ABNORMAL HIGH (ref 5–15)
BUN: 58 mg/dL — ABNORMAL HIGH (ref 8–23)
CO2: 23 mmol/L (ref 22–32)
Calcium: 6.4 mg/dL — CL (ref 8.9–10.3)
Chloride: 91 mmol/L — ABNORMAL LOW (ref 98–111)
Creatinine, Ser: 6.29 mg/dL — ABNORMAL HIGH (ref 0.44–1.00)
GFR, Estimated: 7 mL/min — ABNORMAL LOW (ref >60)
Glucose, Bld: 131 mg/dL — ABNORMAL HIGH (ref 70–99)
Potassium: 5.3 mmol/L — ABNORMAL HIGH (ref 3.5–5.1)
Sodium: 131 mmol/L — ABNORMAL LOW (ref 135–145)

## 2020-11-14 LAB — BLOOD GAS, ARTERIAL
Acid-base deficit: 7.5 mmol/L — ABNORMAL HIGH (ref 0.0–2.0)
Bicarbonate: 17.4 mmol/L — ABNORMAL LOW (ref 20.0–28.0)
Drawn by: 30136
FIO2: 40
O2 Saturation: 96.4 %
Patient temperature: 36
pCO2 arterial: 32.5 mmHg (ref 32.0–48.0)
pH, Arterial: 7.342 — ABNORMAL LOW (ref 7.350–7.450)
pO2, Arterial: 121 mmHg — ABNORMAL HIGH (ref 83.0–108.0)

## 2020-11-14 LAB — PREPARE RBC (CROSSMATCH)

## 2020-11-14 LAB — LACTIC ACID, PLASMA: Lactic Acid, Venous: 4 mmol/L (ref 0.5–1.9)

## 2020-11-14 SURGERY — AMPUTATION, ABOVE KNEE
Anesthesia: General | Site: Leg Upper | Laterality: Left

## 2020-11-14 MED ORDER — PROSOURCE PLUS PO LIQD
30.0000 mL | Freq: Three times a day (TID) | ORAL | Status: DC
  Filled 2020-11-14: qty 30

## 2020-11-14 MED ORDER — DEXTROSE 50 % IV SOLN
INTRAVENOUS | Status: AC
  Filled 2020-11-14: qty 50

## 2020-11-14 MED ORDER — HYDROCORTISONE NA SUCCINATE PF 100 MG IJ SOLR
100.0000 mg | Freq: Three times a day (TID) | INTRAMUSCULAR | Status: DC
  Administered 2020-11-14 – 2020-11-16 (×5): 100 mg via INTRAVENOUS
  Filled 2020-11-14 (×5): qty 2

## 2020-11-14 MED ORDER — CALCIUM GLUCONATE-NACL 1-0.675 GM/50ML-% IV SOLN
1.0000 g | Freq: Once | INTRAVENOUS | Status: AC
  Administered 2020-11-14: 1000 mg via INTRAVENOUS
  Filled 2020-11-14: qty 50

## 2020-11-14 MED ORDER — CALCITRIOL 0.25 MCG PO CAPS: 2.0000 ug | ORAL_CAPSULE | ORAL | Status: DC

## 2020-11-14 MED ORDER — FENTANYL CITRATE (PF) 100 MCG/2ML IJ SOLN
INTRAMUSCULAR | Status: DC | PRN
  Administered 2020-11-14: 50 ug via INTRAVENOUS

## 2020-11-14 MED ORDER — SODIUM CHLORIDE 0.9 % IV BOLUS
250.0000 mL | Freq: Once | INTRAVENOUS | Status: AC
  Administered 2020-11-14: 250 mL via INTRAVENOUS

## 2020-11-14 MED ORDER — PHENYLEPHRINE 40 MCG/ML (10ML) SYRINGE FOR IV PUSH (FOR BLOOD PRESSURE SUPPORT)
PREFILLED_SYRINGE | INTRAVENOUS | Status: DC | PRN
  Administered 2020-11-14: 160 ug via INTRAVENOUS
  Administered 2020-11-14: 120 ug via INTRAVENOUS

## 2020-11-14 MED ORDER — DEXTROSE-NACL 5-0.45 % IV SOLN: INTRAVENOUS | Status: DC

## 2020-11-14 MED ORDER — 0.9 % SODIUM CHLORIDE (POUR BTL) OPTIME
TOPICAL | Status: DC | PRN
  Administered 2020-11-14: 1000 mL

## 2020-11-14 MED ORDER — SODIUM CHLORIDE 0.45 % IV BOLUS: 250.0000 mL | Freq: Once | INTRAVENOUS | Status: DC

## 2020-11-14 MED ORDER — CALCIUM CHLORIDE 10 % IV SOLN
INTRAVENOUS | Status: DC | PRN
  Administered 2020-11-14: 500 mg via INTRAVENOUS
  Administered 2020-11-14: 200 mg via INTRAVENOUS

## 2020-11-14 MED ORDER — DEXAMETHASONE SODIUM PHOSPHATE 10 MG/ML IJ SOLN
INTRAMUSCULAR | Status: DC | PRN
  Administered 2020-11-14: 10 mg via INTRAVENOUS

## 2020-11-14 MED ORDER — CHLORHEXIDINE GLUCONATE CLOTH 2 % EX PADS
6.0000 | MEDICATED_PAD | Freq: Every day | CUTANEOUS | Status: DC
  Administered 2020-11-14 – 2020-11-15 (×2): 6 via TOPICAL

## 2020-11-14 MED ORDER — PROPOFOL 10 MG/ML IV BOLUS
INTRAVENOUS | Status: DC | PRN
  Administered 2020-11-14: 100 mg via INTRAVENOUS

## 2020-11-14 MED ORDER — NOREPINEPHRINE 4 MG/250ML-% IV SOLN
2.0000 ug/min | INTRAVENOUS | Status: DC
  Administered 2020-11-14: 2 ug/min via INTRAVENOUS
  Filled 2020-11-14 (×2): qty 250

## 2020-11-14 MED ORDER — SODIUM CHLORIDE 0.9 % IV SOLN: 250.0000 mL | INTRAVENOUS | Status: DC

## 2020-11-14 MED ORDER — ONDANSETRON HCL 4 MG/2ML IJ SOLN
INTRAMUSCULAR | Status: DC | PRN
  Administered 2020-11-14: 4 mg via INTRAVENOUS

## 2020-11-14 MED ORDER — HEPARIN SOD (PORK) LOCK FLUSH 1 UNIT/ML IV SOLN
2.4000 mL | INTRAVENOUS | Status: DC | PRN
  Filled 2020-11-14: qty 4

## 2020-11-14 MED ORDER — EPHEDRINE SULFATE-NACL 50-0.9 MG/10ML-% IV SOSY
PREFILLED_SYRINGE | INTRAVENOUS | Status: DC | PRN
  Administered 2020-11-14 (×2): 10 mg via INTRAVENOUS

## 2020-11-14 MED ORDER — HEPARIN SODIUM (PORCINE) 1000 UNIT/ML IJ SOLN
2400.0000 [IU] | Freq: Once | INTRAMUSCULAR | Status: AC
  Administered 2020-11-14: 2400 [IU]
  Filled 2020-11-14: qty 3

## 2020-11-14 MED ORDER — SODIUM CHLORIDE 0.45 % IV SOLN: INTRAVENOUS | Status: DC

## 2020-11-14 MED ORDER — CHLORHEXIDINE GLUCONATE 0.12 % MT SOLN
OROMUCOSAL | Status: AC
  Administered 2020-11-14: 15 mL via OROMUCOSAL
  Filled 2020-11-14: qty 15

## 2020-11-14 MED ORDER — DEXTROSE 50 % IV SOLN
1.0000 | Freq: Once | INTRAVENOUS | Status: AC
  Administered 2020-11-14: 50 mL via INTRAVENOUS

## 2020-11-14 MED ORDER — DEXTROSE 10 % IV SOLN: INTRAVENOUS | Status: DC

## 2020-11-14 MED ORDER — EPINEPHRINE 1 MG/10ML IJ SOSY
PREFILLED_SYRINGE | INTRAMUSCULAR | Status: DC | PRN
  Administered 2020-11-14: 50 ug via INTRAVENOUS
  Administered 2020-11-14: 300 ug via INTRAVENOUS
  Administered 2020-11-14: 50 ug via INTRAVENOUS
  Administered 2020-11-14: 100 ug via INTRAVENOUS

## 2020-11-14 MED ORDER — DEXTROSE 50 % IV SOLN: 1.0000 | Freq: Once | INTRAVENOUS | Status: AC

## 2020-11-14 MED ORDER — DEXTROSE 50 % IV SOLN
INTRAVENOUS | Status: AC
  Administered 2020-11-14: 25 mL via INTRAVENOUS
  Filled 2020-11-14: qty 50

## 2020-11-14 MED ORDER — FENTANYL CITRATE (PF) 250 MCG/5ML IJ SOLN
INTRAMUSCULAR | Status: AC
  Filled 2020-11-14: qty 5

## 2020-11-14 MED ORDER — SODIUM CHLORIDE 0.9 % IV SOLN: INTRAVENOUS | Status: DC

## 2020-11-14 MED ORDER — MIDODRINE HCL 5 MG PO TABS
10.0000 mg | ORAL_TABLET | Freq: Three times a day (TID) | ORAL | Status: DC
  Administered 2020-11-15 – 2020-11-16 (×2): 10 mg via ORAL
  Filled 2020-11-14 (×3): qty 2

## 2020-11-14 MED ORDER — PHENYLEPHRINE 40 MCG/ML (10ML) SYRINGE FOR IV PUSH (FOR BLOOD PRESSURE SUPPORT)
PREFILLED_SYRINGE | INTRAVENOUS | Status: AC
  Filled 2020-11-14: qty 10

## 2020-11-14 MED ORDER — VASOPRESSIN 20 UNIT/ML IV SOLN
INTRAVENOUS | Status: AC
  Filled 2020-11-14: qty 1

## 2020-11-14 MED ORDER — DEXTROSE 50 % IV SOLN
25.0000 mL | Freq: Once | INTRAVENOUS | Status: AC
  Administered 2020-11-14: 25 mL via INTRAVENOUS

## 2020-11-14 MED ORDER — CHLORHEXIDINE GLUCONATE CLOTH 2 % EX PADS: 6.0000 | MEDICATED_PAD | Freq: Every day | CUTANEOUS | Status: DC

## 2020-11-14 MED ORDER — LIDOCAINE 2% (20 MG/ML) 5 ML SYRINGE
INTRAMUSCULAR | Status: DC | PRN
  Administered 2020-11-14: 80 mg via INTRAVENOUS

## 2020-11-14 MED ORDER — CHLORHEXIDINE GLUCONATE 0.12 % MT SOLN
15.0000 mL | OROMUCOSAL | Status: AC
  Filled 2020-11-14: qty 15

## 2020-11-14 MED ORDER — SODIUM ZIRCONIUM CYCLOSILICATE 10 G PO PACK
10.0000 g | PACK | Freq: Once | ORAL | Status: AC
  Administered 2020-11-14: 10 g
  Filled 2020-11-14: qty 1

## 2020-11-14 MED ORDER — VASOPRESSIN 20 UNIT/ML IV SOLN
INTRAVENOUS | Status: DC | PRN
  Administered 2020-11-14: 1 [IU] via INTRAVENOUS
  Administered 2020-11-14: 2 [IU] via INTRAVENOUS
  Administered 2020-11-14: 3 [IU] via INTRAVENOUS
  Administered 2020-11-14 (×3): 2 [IU] via INTRAVENOUS

## 2020-11-14 MED ORDER — SODIUM CHLORIDE 0.9 % IV BOLUS
500.0000 mL | Freq: Once | INTRAVENOUS | Status: AC
  Administered 2020-11-14: 500 mL via INTRAVENOUS

## 2020-11-14 MED ORDER — DEXTROSE 50 % IV SOLN: 25.0000 mL | Freq: Once | INTRAVENOUS | Status: AC

## 2020-11-14 MED ORDER — PHENYLEPHRINE HCL-NACL 10-0.9 MG/250ML-% IV SOLN
INTRAVENOUS | Status: DC | PRN
  Administered 2020-11-14: 100 ug/min via INTRAVENOUS

## 2020-11-14 MED ORDER — BOOST / RESOURCE BREEZE PO LIQD CUSTOM: 1.0000 | Freq: Three times a day (TID) | ORAL | Status: DC

## 2020-11-14 MED ORDER — CEFAZOLIN SODIUM-DEXTROSE 2-4 GM/100ML-% IV SOLN
2.0000 g | INTRAVENOUS | Status: AC
  Administered 2020-11-14: 2 g via INTRAVENOUS
  Filled 2020-11-14: qty 100

## 2020-11-14 MED ORDER — DEXTROSE 50 % IV SOLN
INTRAVENOUS | Status: AC
  Administered 2020-11-14: 50 mL via INTRAVENOUS
  Filled 2020-11-14: qty 50

## 2020-11-14 MED ORDER — SODIUM CHLORIDE 0.9 % IV SOLN: 10.0000 mL/h | Freq: Once | INTRAVENOUS | Status: DC

## 2020-11-14 SURGICAL SUPPLY — 42 items
BLADE SAW RECIP 87.9 MT (BLADE) ×4 IMPLANT
BLADE SURG 21 STRL SS (BLADE) ×4 IMPLANT
BNDG COHESIVE 6X5 TAN STRL LF (GAUZE/BANDAGES/DRESSINGS) ×4 IMPLANT
CANISTER WOUND CARE 500ML ATS (WOUND CARE) IMPLANT
COVER SURGICAL LIGHT HANDLE (MISCELLANEOUS) ×4 IMPLANT
COVER WAND RF STERILE (DRAPES) IMPLANT
CUFF TOURN SGL QUICK 34 (TOURNIQUET CUFF)
CUFF TRNQT CYL 34X4.125X (TOURNIQUET CUFF) IMPLANT
DRAPE DERMATAC (DRAPES) ×8 IMPLANT
DRAPE INCISE IOBAN 66X45 STRL (DRAPES) ×8 IMPLANT
DRAPE U-SHAPE 47X51 STRL (DRAPES) ×4 IMPLANT
DRESSING PREVENA PLUS CUSTOM (GAUZE/BANDAGES/DRESSINGS) ×2 IMPLANT
DRSG PREVENA PLUS CUSTOM (GAUZE/BANDAGES/DRESSINGS) ×4
DURAPREP 26ML APPLICATOR (WOUND CARE) ×4 IMPLANT
ELECT REM PT RETURN 9FT ADLT (ELECTROSURGICAL) ×4
ELECTRODE REM PT RTRN 9FT ADLT (ELECTROSURGICAL) ×2 IMPLANT
GLOVE BIOGEL PI IND STRL 7.5 (GLOVE) ×2 IMPLANT
GLOVE BIOGEL PI IND STRL 9 (GLOVE) ×2 IMPLANT
GLOVE BIOGEL PI INDICATOR 7.5 (GLOVE) ×2
GLOVE BIOGEL PI INDICATOR 9 (GLOVE) ×2
GLOVE SURG ORTHO 9.0 STRL STRW (GLOVE) ×4 IMPLANT
GLOVE SURG POLYISO LF SZ6.5 (GLOVE) ×4 IMPLANT
GOWN STRL REUS W/ TWL LRG LVL3 (GOWN DISPOSABLE) ×2 IMPLANT
GOWN STRL REUS W/ TWL XL LVL3 (GOWN DISPOSABLE) ×4 IMPLANT
GOWN STRL REUS W/TWL LRG LVL3 (GOWN DISPOSABLE) ×2
GOWN STRL REUS W/TWL XL LVL3 (GOWN DISPOSABLE) ×4
KIT BASIN OR (CUSTOM PROCEDURE TRAY) ×4 IMPLANT
KIT TURNOVER KIT B (KITS) ×4 IMPLANT
MANIFOLD NEPTUNE II (INSTRUMENTS) ×4 IMPLANT
NS IRRIG 1000ML POUR BTL (IV SOLUTION) ×4 IMPLANT
PACK ORTHO EXTREMITY (CUSTOM PROCEDURE TRAY) ×4 IMPLANT
PAD ARMBOARD 7.5X6 YLW CONV (MISCELLANEOUS) ×4 IMPLANT
PREVENA RESTOR ARTHOFORM 46X30 (CANNISTER) ×4 IMPLANT
STAPLER VISISTAT 35W (STAPLE) IMPLANT
STOCKINETTE IMPERVIOUS LG (DRAPES) IMPLANT
SUT ETHILON 2 0 PSLX (SUTURE) ×8 IMPLANT
SUT SILK 2 0 (SUTURE) ×2
SUT SILK 2-0 18XBRD TIE 12 (SUTURE) ×2 IMPLANT
TOWEL GREEN STERILE FF (TOWEL DISPOSABLE) ×4 IMPLANT
TUBE CONNECTING 20'X1/4 (TUBING) ×1
TUBE CONNECTING 20X1/4 (TUBING) ×3 IMPLANT
YANKAUER SUCT BULB TIP NO VENT (SUCTIONS) ×4 IMPLANT

## 2020-11-14 NOTE — Consult Note (Signed)
ORTHOPAEDIC CONSULTATION  REQUESTING PHYSICIAN: Elgergawy, Silver Huguenin, MD  Chief Complaint: Abscess ulceration left above-the-knee amputation.  HPI: Brandy Houston is a 65 y.o. female who presents with cellulitis abscess and ulceration left above-the-knee amputation.  Patient is status post recent blunt trauma that has persistently become more painful with more drainage.  Patient is status post a left above-knee amputation in 2018.  Patient does have a history of end-stage renal disease, type 2 diabetes, hypertension, protein caloric malnutrition.  Patient is on dialysis.  Past Medical History:  Diagnosis Date  . Allergy   . Anemia   . Anxiety   . Arthritis    "in my joints" (03/10/2017)  . Chronic diastolic CHF (congestive heart failure) ([place])   . Depression    Chronic  . Diabetic peripheral neuropathy ([place])   . Dysrhythmia    tachycardia, normal ECHO 08-09-14  . ESRD (end stage renal disease) ([place])   . GERD (gastroesophageal reflux disease)   . Heart murmur     dx'd 02/05/2017  . Hepatitis C    "tx'd in 2016; I'm negative now" (02/05/2017)  . History of blood transfusion 2017   "w/knee replacement"  . Hyperlipidemia   . Hypertension   . Peripheral neuropathy   . Protein calorie malnutrition ([place])   . Septic arthritis ([place])   . Slow transit constipation   . Symptomatic anemia 01/27/2020  . Type II diabetes mellitus ([name])    IDDM  . Uncontrolled hypertension 02/18/2013   Past Surgical History:  Procedure Laterality Date  . A/V FISTULAGRAM Right 07/07/2018   Procedure: A/V FISTULAGRAM;  Surgeon: Marty Heck, MD;  Location: [place] [facility];  Service: Cardiovascular;  Laterality: Right;  . AMPUTATION Left 03/20/2017   Procedure: LEFT ABOVE KNEE AMPUTATION;  Surgeon: Newt Minion, MD;  Location: [place];  Service: Orthopedics;  Laterality: Left;  . APPENDECTOMY    . AV FISTULA PLACEMENT Left 09/13/2014   Procedure: Brachial Artery to Brachial Vein Gortex Four - Seven  Stretch GRAFT INSERTION Left Forearm;  Surgeon: Mal Misty, MD;  Location: [place];  Service: Vascular;  Laterality: Left;  . BASCILIC VEIN TRANSPOSITION Right 10/26/2017   Procedure: BASILIC VEIN TRANSPOSITION FIRST STAGE RIGHT ARM;  Surgeon: Conrad Seville, MD;  Location: [place];  Service: Vascular;  Laterality: Right;  . BASCILIC VEIN TRANSPOSITION Right 02/01/2018   Procedure: SECOND STAGE BASILIC VEIN TRANSPOSITION RIGHT UPPER EXTREMITY;  Surgeon: Marty Heck, MD;  Location: [place];  Service: Vascular;  Laterality: Right;  . CHOLECYSTECTOMY OPEN    . COLON SURGERY    . COLONOSCOPY    . ENTEROSCOPY N/A 05/28/2020   Procedure: ENTEROSCOPY;  Surgeon: Mauri Pole, MD;  Location: [facility] ENDOSCOPY;  Service: Endoscopy;  Laterality: N/A;  . ENTEROSCOPY N/A 06/25/2020   Procedure: ENTEROSCOPY;  Surgeon: Milus Banister, MD;  Location: [facility] ENDOSCOPY;  Service: Gastroenterology;  Laterality: N/A;  . ESOPHAGOGASTRODUODENOSCOPY (EGD) WITH PROPOFOL N/A 05/14/2020   Procedure: ESOPHAGOGASTRODUODENOSCOPY (EGD) WITH PROPOFOL;  Surgeon: Mauri Pole, MD;  Location: [facility] ENDOSCOPY;  Service: Endoscopy;  Laterality: N/A;  with APC  . EXCISIONAL TOTAL KNEE ARTHROPLASTY WITH ANTIBIOTIC SPACERS Left 02/06/2017   Procedure: Incisional total iknee with antibiotic spacer ;  Surgeon: Leandrew Koyanagi, MD;  Location: [place];  Service: Orthopedics;  Laterality: Left;  . GIVENS CAPSULE STUDY N/A 09/09/2020   Procedure: GIVENS CAPSULE STUDY;  Surgeon: Jackquline Denmark, MD;  Location: [facility] ENDOSCOPY;  Service: Endoscopy;  Laterality: N/A;  .  HOT HEMOSTASIS N/A 05/14/2020   Procedure: HOT HEMOSTASIS (ARGON PLASMA COAGULATION/BICAP);  Surgeon: Mauri Pole, MD;  Location: [facility];  Service: Endoscopy;  Laterality: N/A;  . HOT HEMOSTASIS N/A 05/28/2020   Procedure: HOT HEMOSTASIS (ARGON PLASMA COAGULATION/BICAP);  Surgeon: Mauri Pole, MD;  Location: [facility] ENDOSCOPY;  Service: Endoscopy;  Laterality:  N/A;  . HOT HEMOSTASIS N/A 06/25/2020   Procedure: HOT HEMOSTASIS (ARGON PLASMA COAGULATION/BICAP);  Surgeon: Milus Banister, MD;  Location: [facility] ENDOSCOPY;  Service: Gastroenterology;  Laterality: N/A;  . INTRAVASCULAR PRESSURE WIRE/FFR STUDY N/A 04/13/2020   Procedure: INTRAVASCULAR PRESSURE WIRE/FFR STUDY;  Surgeon: Burnell Blanks, MD;  Location: [place] [facility];  Service: Cardiovascular;  Laterality: N/A;  . IR FLUORO GUIDE CV LINE RIGHT  02/10/2017  . IR REMOVAL TUN CV CATH W/O FL  04/01/2017  . IR US GUIDE VASC ACCESS RIGHT  02/10/2017  . IRRIGATION AND DEBRIDEMENT KNEE Left 03/12/2017   Procedure: IRRIGATION AND DEBRIDEMENT LEFT KNEE WITH WOUND VAC APPLICATION;  Surgeon: Leandrew Koyanagi, MD;  Location: [place];  Service: Orthopedics;  Laterality: Left;  . JOINT REPLACEMENT    . KNEE ARTHROSCOPY Right 08/10/2014   Procedure: ARTHROSCOPY I & D KNEE;  Surgeon: Marianna Payment, MD;  Location: [facility];  Service: Orthopedics;  Laterality: Right;  . KNEE ARTHROSCOPY Left 08/11/2014   Procedure: ARTHROSCOPIC WASHOUT LEFT KNEE;  Surgeon: Marianna Payment, MD;  Location: [place];  Service: Orthopedics;  Laterality: Left;  . KNEE ARTHROSCOPY Left 08/19/2014   Procedure: ARTHROSCOPIC WASHOUT LEFT KNEE;  Surgeon: Leandrew Koyanagi, MD;  Location: [place];  Service: Orthopedics;  Laterality: Left;  . KNEE ARTHROSCOPY WITH LATERAL MENISECTOMY Left 04/04/2015   Procedure: AND PARTIAL LATERAL MENISECTOMY;  Surgeon: Leandrew Koyanagi, MD;  Location: [place];  Service: Orthopedics;  Laterality: Left;  . KNEE ARTHROSCOPY WITH MEDIAL MENISECTOMY Left 04/04/2015   Procedure: LEFT KNEE ARTHROSCOPY WITH PARTIAL MEDIAL MENISCECTOMY  AND SYNOVECTOMY;  Surgeon: Leandrew Koyanagi, MD;  Location: [place];  Service: Orthopedics;  Laterality: Left;  . PERIPHERAL VASCULAR BALLOON ANGIOPLASTY  07/07/2018   Procedure: PERIPHERAL VASCULAR BALLOON ANGIOPLASTY;  Surgeon: Marty Heck, MD;   Location: [place] [facility];  Service: Cardiovascular;;  right AV fistula  . POLYPECTOMY    . RIGHT/LEFT HEART CATH AND CORONARY ANGIOGRAPHY N/A 04/13/2020   Procedure: RIGHT/LEFT HEART CATH AND CORONARY ANGIOGRAPHY;  Surgeon: Burnell Blanks, MD;  Location: [place] [facility];  Service: Cardiovascular;  Laterality: N/A;  . SHOULDER ARTHROSCOPY Bilateral 08/10/2014   Procedure: I & D BILATERAL SHOULDERS ;  Surgeon: Marianna Payment, MD;  Location: [facility];  Service: Orthopedics;  Laterality: Bilateral;  . SMALL INTESTINE SURGERY     Due to Small Bowel Obstruction; "fixed it when they did my gallbladder OR"  . TEE WITHOUT CARDIOVERSION N/A 08/14/2014   Procedure: TRANSESOPHAGEAL ECHOCARDIOGRAM (TEE);  Surgeon: Thayer Headings, MD;  Location: [place];  Service: Cardiovascular;  Laterality: N/A;  . TENOSYNOVECTOMY Right 08/11/2014   Procedure: RIGHT WRIST IRRIGATION AND DEBRIDEMENT, TENOSYNOVECTOMY;  Surgeon: Marianna Payment, MD;  Location: [place];  Service: Orthopedics;  Laterality: Right;  . TOTAL KNEE ARTHROPLASTY Left 11/07/2015   Procedure: LEFT TOTAL KNEE ARTHROPLASTY WITH REVISION OF IMPLANTS;  Surgeon: Leandrew Koyanagi, MD;  Location: [place];  Service: Orthopedics;  Laterality: Left;  . TUBAL LIGATION     Social History   Socioeconomic History  . Marital status: Single  Spouse name: Not on file  . Number of children: 3  . Years of education: Not on file  . Highest education level: Not on file  Occupational History  . Occupation: disabled  Tobacco Use  . Smoking status: Current Every Day Smoker    Packs/day: 0.50    Years: 45.00    Pack years: 22.50    Types: Cigarettes  . Smokeless tobacco: Never Used  Vaping Use  . Vaping Use: Never used  Substance and Sexual Activity  . Alcohol use: Not Currently    Alcohol/week: 0.0 standard drinks    Comment: 03/10/2017 "I drink a wine cooler a few times/year"  . Drug use: No  . Sexual activity: Not Currently  Other Topics  Concern  . Not on file  Social History Narrative   Right handed   Social Determinants of Health   Financial Resource Strain: Not on file  Food Insecurity: Not on file  Transportation Needs: Not on file  Physical Activity: Not on file  Stress: Not on file  Social Connections: Not on file   Family History  Problem Relation Age of Onset  . Uterine cancer Mother   . Heart disease Father   . Hypertension Sister   . Uterine cancer Sister   . Cancer Brother        unsure type  . Diabetes Brother   . Hypertension Brother   . Colon cancer Neg Hx   . Colon polyps Neg Hx   . Esophageal cancer Neg Hx   . Rectal cancer Neg Hx   . Stomach cancer Neg Hx    - negative except otherwise stated in the family history section Allergies  Allergen Reactions  . Compazine [Prochlorperazine] Shortness Of Breath, Swelling and Other (See Comments)    TONGUE SWELLS  . Shellfish-Derived Products Anaphylaxis  . Iodinated Diagnostic Agents Hives and Rash  . Omnipaque [Iohexol] Hives  . Tape Other (See Comments)    [place]ark, paper tape caused skin irritation  . Sulfa Antibiotics Rash   Prior to Admission medications   Medication Sig Start Date End Date Taking? Authorizing Provider  atorvastatin (LIPITOR) 10 MG tablet Take 1 tablet (10 mg total) by mouth at bedtime. 06/13/20  Yes [name], MD  calcium acetate (PHOSLO) 667 MG capsule Take [phone] mg by mouth See admin instructions. Take 1,334 mg by mouth three times a day with meals and 667 mg twice a day with each snack 01/13/19  Yes [provider]  diphenhydrAMINE (BENADRYL) 25 mg capsule Take 25 mg by mouth every 6 (six) hours as needed for allergies.   Yes [provider]  diphenhydramine-acetaminophen (TYLENOL PM) 25-500 MG TABS tablet Take 1 tablet by mouth at bedtime.   Yes [provider]  doxycycline (VIBRAMYCIN) 100 MG capsule Take 1 capsule (100 mg total) by mouth 2 (two) times daily. 11/08/20  Yes Pattricia Boss, MD  Ensure (ENSURE) Take 237 mLs by mouth daily with breakfast.   Yes [provider]  hydrALAZINE (APRESOLINE) 100 MG tablet Take 100 mg by mouth in the morning and at bedtime.   Yes [provider]  isosorbide mononitrate (IMDUR) 30 MG 24 hr tablet Take 1 tablet (30 mg total) by mouth daily. 07/16/20 10/14/20 Yes Donato Heinz, MD  lidocaine-prilocaine (EMLA) cream Apply 1 application topically Every Tuesday,Thursday,and Saturday with dialysis. 10/25/18  Yes [provider]  metoprolol succinate (TOPROL XL) 25 MG 24 hr tablet Take 1 tablet (25 mg total) by mouth  daily. 10/31/20  Yes Donato Heinz, MD  misoprostol (CYTOTEC) 100 MCG tablet TAKE 1 TABLET BY MOUTH EVERY 6 HOURS Patient taking differently: Take 100 mcg by mouth every 6 (six) hours. 10/29/20  Yes Fayrene Helper, MD  multivitamin (RENA-VIT) TABS tablet Take 1 tablet by mouth in the morning. 03/01/19  Yes [provider]  nitroGLYCERIN (NITROSTAT) 0.4 MG SL tablet Place 1 tablet (0.4 mg total) under the tongue every 5 (five) minutes as needed. Patient taking differently: Place 0.4 mg under the tongue every 5 (five) minutes as needed for chest pain. 04/25/20 07/24/20 Yes Donato Heinz, MD  nortriptyline (PAMELOR) 10 MG capsule Take 3 capsules (30 mg total) by mouth at bedtime. 10/29/20  Yes Pieter Partridge, DO  Accu-Chek Softclix Lancets lancets Check sugars three times a day for E11.65 09/20/18   [name], MD  acetaminophen (TYLENOL) 500 MG tablet Take 1,000 mg by mouth every 6 (six) hours as needed for mild pain.    [provider]  blood glucose meter kit and supplies KIT Dispense based on patient and insurance preference. Use up to four times daily as directed 10/13/19   [name], MD  Blood Glucose Monitoring Suppl (ACCU-CHEK GUIDE) w/Device KIT 1 Device by Does not apply route daily. 10/31/19   [name], MD  diltiazem 2 % GEL Apply 1  application topically 3 (three) times daily. Apply to affected area on anus Patient taking differently: Apply 1 application topically 3 (three) times daily as needed (for irritation on/in the anus). 06/25/20   Samuella Cota, MD  glucose blood (ACCU-CHEK GUIDE) test strip Check blood sugar up to 4 times a day 10/20/19   [name], MD  HYDROcodone-acetaminophen (NORCO) 5-325 MG tablet Take 1 tablet by mouth every 6 (six) hours as needed for moderate pain. 10/31/20   [name], MD  hydrocortisone (ANUSOL-HC) 25 MG suppository Place 1 suppository (25 mg total) rectally 2 (two) times daily. Patient taking differently: Place 25 mg rectally 2 (two) times daily as needed for hemorrhoids or anal itching. 06/25/20   Samuella Cota, MD  Iron, Ferrous Sulfate, 325 (65 Fe) MG TABS Take 325 mg by mouth daily. Note that this medication will turn stool black and may cause constipation. 06/25/20   Samuella Cota, MD  Lancet Devices [facility]) lancets Check sugars TID for E11.65 01/29/15   Lance Bosch, NP  pantoprazole (PROTONIX) 40 MG tablet Take 1 tablet (40 mg total) by mouth daily. 40 mg BID for 2 months & then once daily Patient taking differently: Take 40 mg by mouth See admin instructions. Take 40 mg by mouth two times a day for 2 months, then decrease to once a day 08/03/20 10/02/20  [name], MD  polyethylene glycol (MIRALAX / GLYCOLAX) 17 g packet Take 17 g by mouth daily as needed for moderate constipation. Patient taking differently: Take 17 g by mouth daily as needed for moderate constipation (MIX AND DRINK). 12/10/18   [name], MD  UNABLE TO FIND Prosthetic socket, from [facility] 08/03/20   [name], MD  traZODone (DESYREL) 50 MG tablet Take 50 mg by mouth at bedtime as needed for sleep. Patient not taking: Reported on 10/01/2020 10/27/18   [provider]   DG Chest 2 View  Result Date: 10/20/2020 CLINICAL DATA:  Shortness of breath.   Difficulty swallowing. EXAM: CHEST - 2 VIEW COMPARISON:  11/08/2020 FINDINGS: Stable enlargement  of the cardiac silhouette. Stable densities at left lung base are suggestive for left pleural fluid. Right lung remains clear. No overt pulmonary edema. Trachea is midline. Atherosclerotic calcifications at the aortic arch. IMPRESSION: 1. Persistent left basilar chest densities. Findings are compatible with combination with a small pleural effusion and atelectasis. Minimal change from the previous examination. 2. Stable enlargement of the cardiac silhouette. Electronically Signed   By: Markus Daft M.D.   On: 11/07/2020 12:00   DG Femur Min 2 Views Left  Result Date: 11/12/2020 CLINICAL DATA:  Amputation. EXAM: LEFT FEMUR 2 VIEWS COMPARISON:  11/08/2020. FINDINGS: Prior AKA. Mild skin irregularity about the distal stump again noted. No acute or focal bony abnormality. Peripheral vascular calcification. Surgical clips in the pelvis. Calcified density in pelvis most likely calcified fibroid. IMPRESSION: 1. Prior AKA. Soft tissue irregularity is again noted about distal stump. No acute bony or focal abnormality. 2.  Peripheral vascular disease. Electronically Signed   By: Marcello Moores  Register   On: 10/23/2020 12:01   - pertinent xrays, CT, MRI studies were reviewed and independently interpreted  Positive ROS: All other systems have been reviewed and were otherwise negative with the exception of those mentioned in the HPI and as above.  Physical Exam: General: Alert, no acute distress Psychiatric: Patient is competent for consent with normal mood and affect Lymphatic: No axillary or cervical lymphadenopathy Cardiovascular: No pedal edema Respiratory: No cyanosis, no use of accessory musculature GI: No organomegaly, abdomen is soft and non-tender    Images:  _0 @  Labs:  Lab Results  Component Value Date   HGBA1C 4.8 06/21/2020   HGBA1C 4.6 (L) 04/14/2020   HGBA1C 4.8 10/31/2019   ESRSEDRATE 8  11/07/2020   ESRSEDRATE 99 (H) 07/21/2017   ESRSEDRATE 102 (H) 10/29/2015   CRP 4.9 (H) 10/29/2020   CRP 11.1 (H) 07/21/2017   CRP 1.9 (H) 03/12/2017   REPTSTATUS PENDING 10/19/2020   GRAMSTAIN  02/06/2017    FEW WBC PRESENT, PREDOMINANTLY MONONUCLEAR NO ORGANISMS SEEN    CULT  11/12/2020    NO GROWTH < 24 HOURS Performed at [facility], [place] [address] [place],  [address]    LABORGA NO GROWTH 02/03/2017    Lab Results  Component Value Date   ALBUMIN 2.3 (L) 09/22/2020   ALBUMIN 2.0 (L) 09/11/2020   ALBUMIN 3.1 (L) 08/24/2020   PREALBUMIN 10.5 (L) 11/06/2020     CBC EXTENDED Latest Ref Rng & Units 12/04/2020 10/27/2020 11/03/2020  WBC 4.0 - 10.5 K/uL 7.1 - -  RBC 3.87 - 5.11 MIL/uL 3.33(L) - -  HGB 12.0 - 15.0 g/dL 9.2(L) 10.5(L) 12.2  HCT 36.0 - 46.0 % 28.1(L) 31.0(L) 36.0  PLT 150 - 400 K/uL 116(L) - -  NEUTROABS 1.7 - 7.7 K/uL - - -  LYMPHSABS 0.7 - 4.0 K/uL - - -    Neurologic: Patient does not have protective sensation bilateral lower extremities.   MUSCULOSKELETAL:   Skin: Examination patient has purulent drainage from the left above-the-knee amputation with a necrotic ulcer.  Her thigh is tender to palpation there is no crepitation no evidence of necrotizing fasciitis.  Her white blood cell count is 7.1 hemoglobin 9.2, albumin 2.3, hemoglobin A1c 4.8 in January.  Assessment: Assessment: Abscess ulceration and cellulitis left above-the-knee amputation 4 years status post left above-the-knee amputation.  Plan: We will plan for revision of the left above-the-knee amputation today.  Risk and benefits were discussed including risk of the wound not healing risk of  persistent infection.  With the current purulent abscess drainage patient will need surgical debridement.  Thank you for the consult and the opportunity to see Ms. Keith Rake, [name] [phone] 8:15 AM

## 2020-11-14 NOTE — Progress Notes (Signed)
Patient admitted to PACU s/p AKA revision. Patient is unresponsive and unable to check a temperature or oxygen sat. MDA aware. Blood sugar checked with a result of "low". Full ampule of D50 given. Blood sugar rechecked 15 minutes later for another result of "low". Another ampule of D50 order and given. Spoke with attending and request made to check a blood sugar from earlobe. Blood sugar resulted as 352 from earlobe after 2 amplues of D50. CCM team at bedside at request of attending and MDA.

## 2020-11-14 NOTE — Op Note (Signed)
11/16/2020  3:33 PM  PATIENT:  Brandy Houston    PRE-OPERATIVE DIAGNOSIS:  Abscess Left AKA  POST-OPERATIVE DIAGNOSIS:  Same  PROCEDURE:  AMPUTATION ABOVE KNEE, APPLICATION OF WOUND VAC  SURGEON:  Newt Minion, MD  PHYSICIAN ASSISTANT:None ANESTHESIA:   General  PREOPERATIVE INDICATIONS:  Brandy Houston is a  65 y.o. female with a diagnosis of Abscess Left AKA who failed conservative measures and elected for surgical management.    The risks benefits and alternatives were discussed with the patient preoperatively including but not limited to the risks of infection, bleeding, nerve injury, cardiopulmonary complications, the need for revision surgery, among others, and the patient was willing to proceed.  OPERATIVE IMPLANTS: Praveena customizable wound VAC  @ENCIMAGES @  OPERATIVE FINDINGS: Severe calcified vessels with minimal microcirculation.  Margins were clear.  OPERATIVE PROCEDURE: Patient was brought the operating room and underwent a general anesthetic.  After adequate levels anesthesia were obtained patient's left lower extremity was prepped using DuraPrep draped into a sterile field a timeout was called.  A fishmouth incision was made proximal the  ischemic tissue.  This was carried down through the muscle and a reciprocating saw was used to amputate through the femur.  The tissue margins showed no abscess no necrosis.  There was ischemic changes to the muscle.  The vascular bundles were suture ligated with 2-0 silk.  There was essentially no petechial bleeding.  The wound was irrigated with normal saline the deep and superficial fascial layers and skin was closed using 2-0 nylon this was covered with a circumferential wound VAC covered with derma tack this had a good suction fit patient was extubated taken to the PACU.   DISCHARGE PLANNING:  Antibiotic duration: Continue IV antibiotics for 24 hours  Weightbearing: Not applicable  Pain medication: Opioid pathway  Dressing  care/ Wound VAC: Continue wound VAC for 1 week  Ambulatory devices: Not applicable  Discharge to: Anticipate discharge to skilled nursing.  Follow-up: In the office 1 week post operative.

## 2020-11-14 NOTE — Plan of Care (Signed)
  Problem: Education: ?Goal: Knowledge of General Education information will improve ?Description: Including pain rating scale, medication(s)/side effects and non-pharmacologic comfort measures ?Outcome: Progressing ?  ?Problem: Health Behavior/Discharge Planning: ?Goal: Ability to manage health-related needs will improve ?Outcome: Progressing ?  ?Problem: Clinical Measurements: ?Goal: Ability to maintain clinical measurements within normal limits will improve ?Outcome: Progressing ?Goal: Will remain free from infection ?Outcome: Progressing ?Goal: Diagnostic test results will improve ?Outcome: Progressing ?Goal: Respiratory complications will improve ?Outcome: Progressing ?  ?Problem: Activity: ?Goal: Risk for activity intolerance will decrease ?Outcome: Progressing ?  ?Problem: Coping: ?Goal: Level of anxiety will decrease ?Outcome: Progressing ?  ?Problem: Elimination: ?Goal: Will not experience complications related to bowel motility ?Outcome: Progressing ?Goal: Will not experience complications related to urinary retention ?Outcome: Progressing ?  ?Problem: Pain Managment: ?Goal: General experience of comfort will improve ?Outcome: Progressing ?  ?Problem: Safety: ?Goal: Ability to remain free from injury will improve ?Outcome: Progressing ?  ?Problem: Skin Integrity: ?Goal: Risk for impaired skin integrity will decrease ?Outcome: Progressing ?  ?

## 2020-11-14 NOTE — Progress Notes (Signed)
Patient c/o of arm pain above IV site. Upon assessment, patient's only IV access is infiltrated. CCM MD made aware. Will plan to place trilysis catheter at the bedside. [facility] RN made aware.

## 2020-11-14 NOTE — Consult Note (Signed)
[facility] Renal Consultation Note    Indication for Consultation:  Management of ESRD/hemodialysis, anemia, hypertension/volume, and secondary hyperparathyroidism.  HPI: Brandy Houston is a 65 y.o. female with PMH including ESRD on dialysis TTS, PAD with L AKA, DM, CHF, GI bleed, HTN, who presented to the ED with dysphagia and stump pain. Pt missed HD yesterday due to ED visit. Pt initially was planned to go home from the ED but became increasingly unstable so was admitted. She is currently hypotensive despite no BP meds and was given 1L LR yesterday. Reports she has not been eating or drinking well lately. At present, she is reporting SOB worse with exertion and laying flat. Reports generalized discomfort and mild dizziness. Denies abdominal pain and nausea. Reports severe pain in the left leg. Labs notable for K+ 5.3, Cr 6.29, BUN 58, Ca 6.4, WBC 7.1, Hgb 9.2, Plt 116. CXR showed small pleural effusion and atelectasis, unchanged since prior.  VS notable for hypothermia yesterday, now resolved. Persistent hypotension with BP in the 16'X systolic. On O2 3L via [place].  She was seen by Dr. Sharol Given this Am and plan is for L AKA with surgical debridement.    Past Medical History:  Diagnosis Date  . Allergy   . Anemia   . Anxiety   . Arthritis    "in my joints" (03/10/2017)  . Chronic diastolic CHF (congestive heart failure) ([place])   . Depression    Chronic  . Diabetic peripheral neuropathy ([place])   . Dysrhythmia    tachycardia, normal ECHO 08-09-14  . ESRD (end stage renal disease) ([place])   . GERD (gastroesophageal reflux disease)   . Heart murmur     dx'd 02/05/2017  . Hepatitis C    "tx'd in 2016; I'm negative now" (02/05/2017)  . History of blood transfusion 2017   "w/knee replacement"  . Hyperlipidemia   . Hypertension   . Peripheral neuropathy   . Protein calorie malnutrition ([place])   . Septic arthritis ([place])   . Slow transit constipation   . Symptomatic anemia 01/27/2020  .  Type II diabetes mellitus ([name])    IDDM  . Uncontrolled hypertension 02/18/2013   Past Surgical History:  Procedure Laterality Date  . A/V FISTULAGRAM Right 07/07/2018   Procedure: A/V FISTULAGRAM;  Surgeon: Marty Heck, MD;  Location: [place] [facility];  Service: Cardiovascular;  Laterality: Right;  . AMPUTATION Left 03/20/2017   Procedure: LEFT ABOVE KNEE AMPUTATION;  Surgeon: Newt Minion, MD;  Location: [place];  Service: Orthopedics;  Laterality: Left;  . APPENDECTOMY    . AV FISTULA PLACEMENT Left 09/13/2014   Procedure: Brachial Artery to Brachial Vein Gortex Four - Seven Stretch GRAFT INSERTION Left Forearm;  Surgeon: Mal Misty, MD;  Location: [place];  Service: Vascular;  Laterality: Left;  . BASCILIC VEIN TRANSPOSITION Right 10/26/2017   Procedure: BASILIC VEIN TRANSPOSITION FIRST STAGE RIGHT ARM;  Surgeon: Conrad [place], MD;  Location: [place];  Service: Vascular;  Laterality: Right;  . BASCILIC VEIN TRANSPOSITION Right 02/01/2018   Procedure: SECOND STAGE BASILIC VEIN TRANSPOSITION RIGHT UPPER EXTREMITY;  Surgeon: Marty Heck, MD;  Location: [place];  Service: Vascular;  Laterality: Right;  . CHOLECYSTECTOMY OPEN    . COLON SURGERY    . COLONOSCOPY    . ENTEROSCOPY N/A 05/28/2020   Procedure: ENTEROSCOPY;  Surgeon: Mauri Pole, MD;  Location: [facility]OSCOPY;  Service: Endoscopy;  Laterality: N/A;  . ENTEROSCOPY N/A 06/25/2020   Procedure:  ENTEROSCOPY;  Surgeon: Milus Banister, MD;  Location: [facility] ENDOSCOPY;  Service: Gastroenterology;  Laterality: N/A;  . ESOPHAGOGASTRODUODENOSCOPY (EGD) WITH PROPOFOL N/A 05/14/2020   Procedure: ESOPHAGOGASTRODUODENOSCOPY (EGD) WITH PROPOFOL;  Surgeon: Mauri Pole, MD;  [facility]: [facility];  Service: Endoscopy;  Laterality: N/A;  with APC  . EXCISIONAL TOTAL KNEE ARTHROPLASTY WITH ANTIBIOTIC SPACERS Left 02/06/2017   Procedure: Incisional total iknee with antibiotic spacer ;  Surgeon: Leandrew Koyanagi, MD;  Location: [place];  Service: Orthopedics;  Laterality: Left;  . GIVENS CAPSULE STUDY N/A 09/09/2020   Procedure: GIVENS CAPSULE STUDY;  Surgeon: Jackquline Denmark, MD;  Location: [facility] ENDOSCOPY;  Service: Endoscopy;  Laterality: N/A;  . HOT HEMOSTASIS N/A 05/14/2020   Procedure: HOT HEMOSTASIS (ARGON PLASMA COAGULATION/BICAP);  Surgeon: Mauri Pole, MD;  Location: [facility];  Service: Endoscopy;  Laterality: N/A;  . HOT HEMOSTASIS N/A 05/28/2020   Procedure: HOT HEMOSTASIS (ARGON PLASMA COAGULATION/BICAP);  Surgeon: Mauri Pole, MD;  Location: [facility] ENDOSCOPY;  Service: Endoscopy;  Laterality: N/A;  . HOT HEMOSTASIS N/A 06/25/2020   Procedure: HOT HEMOSTASIS (ARGON PLASMA COAGULATION/BICAP);  Surgeon: Milus Banister, MD;  Location: [facility] ENDOSCOPY;  Service: Gastroenterology;  Laterality: N/A;  . INTRAVASCULAR PRESSURE WIRE/FFR STUDY N/A 04/13/2020   Procedure: INTRAVASCULAR PRESSURE WIRE/FFR STUDY;  Surgeon: Burnell Blanks, MD;  Location: [place] [facility];  Service: Cardiovascular;  Laterality: N/A;  . IR FLUORO GUIDE CV LINE RIGHT  02/10/2017  . IR REMOVAL TUN CV CATH W/O FL  04/01/2017  . IR US GUIDE VASC ACCESS RIGHT  02/10/2017  . IRRIGATION AND DEBRIDEMENT KNEE Left 03/12/2017   Procedure: IRRIGATION AND DEBRIDEMENT LEFT KNEE WITH WOUND VAC APPLICATION;  Surgeon: Leandrew Koyanagi, MD;  Location: [place];  Service: Orthopedics;  Laterality: Left;  . JOINT REPLACEMENT    . KNEE ARTHROSCOPY Right 08/10/2014   Procedure: ARTHROSCOPY I & D KNEE;  Surgeon: Marianna Payment, MD;  [facility];  Service: Orthopedics;  Laterality: Right;  . KNEE ARTHROSCOPY Left 08/11/2014   Procedure: ARTHROSCOPIC WASHOUT LEFT KNEE;  Surgeon: Marianna Payment, MD;  Location: [place];  Service: Orthopedics;  Laterality: Left;  . KNEE ARTHROSCOPY Left 08/19/2014   Procedure: ARTHROSCOPIC WASHOUT LEFT KNEE;  Surgeon: Leandrew Koyanagi, MD;  Location: [place];  Service: Orthopedics;  Laterality: Left;  . KNEE ARTHROSCOPY WITH  LATERAL MENISECTOMY Left 04/04/2015   Procedure: AND PARTIAL LATERAL MENISECTOMY;  Surgeon: Leandrew Koyanagi, MD;  Location: [place];  Service: Orthopedics;  Laterality: Left;  . KNEE ARTHROSCOPY WITH MEDIAL MENISECTOMY Left 04/04/2015   Procedure: LEFT KNEE ARTHROSCOPY WITH PARTIAL MEDIAL MENISCECTOMY  AND SYNOVECTOMY;  Surgeon: Leandrew Koyanagi, MD;  Location: [place];  Service: Orthopedics;  Laterality: Left;  . PERIPHERAL VASCULAR BALLOON ANGIOPLASTY  07/07/2018   Procedure: PERIPHERAL VASCULAR BALLOON ANGIOPLASTY;  Surgeon: Marty Heck, MD;  Location: [place] [facility];  Service: Cardiovascular;;  right AV fistula  . POLYPECTOMY    . RIGHT/LEFT HEART CATH AND CORONARY ANGIOGRAPHY N/A 04/13/2020   Procedure: RIGHT/LEFT HEART CATH AND CORONARY ANGIOGRAPHY;  Surgeon: Burnell Blanks, MD;  Location: [place] [facility];  Service: Cardiovascular;  Laterality: N/A;  . SHOULDER ARTHROSCOPY Bilateral 08/10/2014   Procedure: I & D BILATERAL SHOULDERS ;  Surgeon: Marianna Payment, MD;  [facility];  Service: Orthopedics;  Laterality: Bilateral;  . SMALL INTESTINE SURGERY     Due to Small Bowel Obstruction; "fixed it when  they did my gallbladder OR"  . TEE WITHOUT CARDIOVERSION N/A 08/14/2014   Procedure: TRANSESOPHAGEAL ECHOCARDIOGRAM (TEE);  Surgeon: Thayer Headings, MD;  Location: [place];  Service: Cardiovascular;  Laterality: N/A;  . TENOSYNOVECTOMY Right 08/11/2014   Procedure: RIGHT WRIST IRRIGATION AND DEBRIDEMENT, TENOSYNOVECTOMY;  Surgeon: Marianna Payment, MD;  Location: [place];  Service: Orthopedics;  Laterality: Right;  . TOTAL KNEE ARTHROPLASTY Left 11/07/2015   Procedure: LEFT TOTAL KNEE ARTHROPLASTY WITH REVISION OF IMPLANTS;  Surgeon: Leandrew Koyanagi, MD;  Location: [place];  Service: Orthopedics;  Laterality: Left;  . TUBAL LIGATION     Family History  Problem Relation Age of Onset  . Uterine cancer Mother   . Heart disease Father    . Hypertension Sister   . Uterine cancer Sister   . Cancer Brother        unsure type  . Diabetes Brother   . Hypertension Brother   . Colon cancer Neg Hx   . Colon polyps Neg Hx   . Esophageal cancer Neg Hx   . Rectal cancer Neg Hx   . Stomach cancer Neg Hx    Social History:  reports that she has been smoking cigarettes. She has a 22.50 pack-year smoking history. She has never used smokeless tobacco. She reports previous alcohol use. She reports that she does not use drugs.  ROS: As per HPI otherwise negative.  Physical Exam: Vitals:   11/19/2020 0356 11/16/2020 0618 11/19/2020 0628 12/07/2020 0726  BP: (!) 88/59 (!) 84/54  (!) 82/55  Pulse: 82 76  75  Resp: '15 13 16 19  ' Temp:    97.6 F (36.4 C)  TempSrc:    Oral  SpO2: 100% 97%  95%     General: Well developed, fatigued appearing female in NAD Head: Normocephalic, atraumatic, sclera non-icteric, mucus membranes are moist. Neck:  JVD not elevated. Lungs: Clear bilaterally to auscultation without wheezes, rales, or rhonchi. Breathing is unlabored on O2 via  Heart: RRR with normal S1, S2. No murmurs, rubs, or gallops appreciated. Abdomen: Soft, non-tender, non-distended with normoactive bowel sounds. No rebound/guarding. No obvious abdominal masses. Lower extremities: L stump covered, no edema RLE Neuro: Alert and oriented X 3. Moves all extremities spontaneously. Psych:  Responds to questions appropriately with a normal affect. Dialysis Access: RUE AVF + bruit  Allergies  Allergen Reactions  . Compazine [Prochlorperazine] Shortness Of Breath, Swelling and Other (See Comments)    TONGUE SWELLS  . Shellfish-Derived Products Anaphylaxis  . Iodinated Diagnostic Agents Hives and Rash  . Omnipaque [Iohexol] Hives  . Tape Other (See Comments)    [place], paper tape caused skin irritation  . Sulfa Antibiotics Rash   Prior to Admission medications   Medication Sig Start Date End Date Taking? Authorizing Provider  atorvastatin  (LIPITOR) 10 MG tablet Take 1 tablet (10 mg total) by mouth at bedtime. 06/13/20  Yes [name], MD  calcium acetate (PHOSLO) 667 MG capsule Take [phone] mg by mouth See admin instructions. Take 1,334 mg by mouth three times a day with meals and 667 mg twice a day with each snack 01/13/19  Yes [provider]  diphenhydrAMINE (BENADRYL) 25 mg capsule Take 25 mg by mouth every 6 (six) hours as needed for allergies.   Yes [provider]  diphenhydramine-acetaminophen (TYLENOL PM) 25-500 MG TABS tablet Take 1 tablet by mouth at bedtime.   Yes [provider]  doxycycline (VIBRAMYCIN) 100 MG capsule Take 1 capsule (100 mg total)  by mouth 2 (two) times daily. 11/08/20  Yes Pattricia Boss, MD  Ensure (ENSURE) Take 237 mLs by mouth daily with breakfast.   Yes [provider]  hydrALAZINE (APRESOLINE) 100 MG tablet Take 100 mg by mouth in the morning and at bedtime.   Yes [provider]  isosorbide mononitrate (IMDUR) 30 MG 24 hr tablet Take 1 tablet (30 mg total) by mouth daily. 07/16/20 10/14/20 Yes Donato Heinz, MD  lidocaine-prilocaine (EMLA) cream Apply 1 application topically Every Tuesday,Thursday,and Saturday with dialysis. 10/25/18  Yes [provider]  metoprolol succinate (TOPROL XL) 25 MG 24 hr tablet Take 1 tablet (25 mg total) by mouth daily. 10/31/20  Yes Donato Heinz, MD  misoprostol (CYTOTEC) 100 MCG tablet TAKE 1 TABLET BY MOUTH EVERY 6 HOURS Patient taking differently: Take 100 mcg by mouth every 6 (six) hours. 10/29/20  Yes Fayrene Helper, MD  multivitamin (RENA-VIT) TABS tablet Take 1 tablet by mouth in the morning. 03/01/19  Yes [provider]  nitroGLYCERIN (NITROSTAT) 0.4 MG SL tablet Place 1 tablet (0.4 mg total) under the tongue every 5 (five) minutes as needed. Patient taking differently: Place 0.4 mg under the tongue every 5 (five) minutes as needed for chest pain. 04/25/20 07/24/20 Yes  Donato Heinz, MD  nortriptyline (PAMELOR) 10 MG capsule Take 3 capsules (30 mg total) by mouth at bedtime. 10/29/20  Yes Pieter Partridge, DO  Accu-Chek Softclix Lancets lancets Check sugars three times a day for E11.65 09/20/18   [name], MD  acetaminophen (TYLENOL) 500 MG tablet Take 1,000 mg by mouth every 6 (six) hours as needed for mild pain.    [provider]  blood glucose meter kit and supplies KIT Dispense based on patient and insurance preference. Use up to four times daily as directed 10/13/19   [name], MD  Blood Glucose Monitoring Suppl (ACCU-CHEK GUIDE) w/Device KIT 1 Device by Does not apply route daily. 10/31/19   [name], MD  diltiazem 2 % GEL Apply 1 application topically 3 (three) times daily. Apply to affected area on anus Patient taking differently: Apply 1 application topically 3 (three) times daily as needed (for irritation on/in the anus). 06/25/20   Samuella Cota, MD  glucose blood (ACCU-CHEK GUIDE) test strip Check blood sugar up to 4 times a day 10/20/19   [name], MD  HYDROcodone-acetaminophen (NORCO) 5-325 MG tablet Take 1 tablet by mouth every 6 (six) hours as needed for moderate pain. 10/31/20   [name], MD  hydrocortisone (ANUSOL-HC) 25 MG suppository Place 1 suppository (25 mg total) rectally 2 (two) times daily. Patient taking differently: Place 25 mg rectally 2 (two) times daily as needed for hemorrhoids or anal itching. 06/25/20   Samuella Cota, MD  Iron, Ferrous Sulfate, 325 (65 Fe) MG TABS Take 325 mg by mouth daily. Note that this medication will turn stool black and may cause constipation. 06/25/20   Samuella Cota, MD  Lancet Devices [facility]) lancets Check sugars TID for E11.65 01/29/15   Lance Bosch, NP  pantoprazole (PROTONIX) 40 MG tablet Take 1 tablet (40 mg total) by mouth daily. 40 mg BID for 2 months & then once daily Patient taking differently: Take 40 mg by mouth See admin  instructions. Take 40 mg by mouth two times a day for 2 months, then decrease to once a day 08/03/20 10/02/20  [name], MD  polyethylene glycol [facility] /  GLYCOLAX) 17 g packet Take 17 g by mouth daily as needed for moderate constipation. Patient taking differently: Take 17 g by mouth daily as needed for moderate constipation (MIX AND DRINK). 12/10/18   [name], MD  UNABLE TO FIND Prosthetic socket, from [facility] 08/03/20   [name], MD  traZODone (DESYREL) 50 MG tablet Take 50 mg by mouth at bedtime as needed for sleep. Patient not taking: Reported on 10/01/2020 10/27/18   [provider]   Current Facility-Administered Medications  Medication Dose Route Frequency Provider Last Rate Last Admin  . acetaminophen (TYLENOL) tablet 650 mg  650 mg Oral Q6H PRN Karmen Bongo, MD       Or  . acetaminophen (TYLENOL) suppository 650 mg  650 mg Rectal Q6H PRN Karmen Bongo, MD      . atorvastatin (LIPITOR) tablet 10 mg  10 mg Oral Ivery Quale, MD      . calcium acetate (PHOSLO) capsule 1,334 mg  1,334 mg Oral TID WC Karmen Bongo, MD       And  . calcium acetate (PHOSLO) capsule 667 mg  667 mg Oral With snacks Karmen Bongo, MD   667 mg at 11/09/2020 2005  . calcium carbonate (dosed in mg elemental calcium) suspension 500 mg of elemental calcium  500 mg of elemental calcium Oral Q6H PRN Karmen Bongo, MD      . camphor-menthol [facility]) lotion 1 application  1 application Topical M6Q PRN Karmen Bongo, MD       And  . hydrOXYzine (ATARAX/VISTARIL) tablet 25 mg  25 mg Oral Q8H PRN Karmen Bongo, MD      . ceFEPIme (MAXIPIME) 1 g in sodium chloride 0.9 % 100 mL IVPB  1 g Intravenous Q24H [facility], [facility]   Stopped at 11/12/2020 1959  . dextrose 5 %-0.45 % sodium chloride infusion   Intravenous Continuous Elgergawy, Silver Huguenin, MD 30 mL/hr at 12/10/2020 0748 New Bag at 12/02/2020 0748  . docusate sodium (ENEMEEZ) enema 283 mg  1 enema Rectal PRN Karmen Bongo,  MD      . feeding supplement (NEPRO CARB STEADY) liquid 237 mL  237 mL Oral TID PRN Karmen Bongo, MD      . ferrous sulfate tablet 325 mg  325 mg Oral Daily Karmen Bongo, MD   325 mg at 11/11/2020 1822  . heparin injection 5,000 Units  5,000 Units Subcutaneous Lynne Logan, MD   5,000 Units at 11/24/2020 0507  . insulin aspart (novoLOG) injection 0-6 Units  0-6 Units Subcutaneous TID WC Karmen Bongo, MD      . isosorbide mononitrate (IMDUR) 24 hr tablet 30 mg  30 mg Oral Daily Karmen Bongo, MD      . metroNIDAZOLE (FLAGYL) IVPB 500 mg  500 mg Intravenous Lynne Logan, MD   Stopped at 12/09/2020 0245  . midodrine (PROAMATINE) tablet 10 mg  10 mg Oral TID WC Mansy, Jan A, MD      . misoprostol (CYTOTEC) tablet 100 mcg  100 mcg Oral Q6H Karmen Bongo, MD   100 mcg at 10/20/2020 1903  . morphine 2 MG/ML injection 2 mg  2 mg Intravenous Q2H PRN Mansy, Jan A, MD      . multivitamin (RENA-VIT) tablet 1 tablet  1 tablet Oral q AM Karmen Bongo, MD      . nitroGLYCERIN (NITROSTAT) SL tablet 0.4 mg  0.4 mg Sublingual Q5 min PRN Mansy, Arvella Merles, MD      .  nortriptyline (PAMELOR) capsule 30 mg  30 mg Oral QHS Karmen Bongo, MD      . ondansetron [facility]) tablet 4 mg  4 mg Oral Q6H PRN Karmen Bongo, MD       Or  . ondansetron [facility]) injection 4 mg  4 mg Intravenous Q6H PRN Karmen Bongo, MD      . pantoprazole (PROTONIX) EC tablet 40 mg  40 mg Oral Daily Karmen Bongo, MD   40 mg at 11/11/2020 1822  . polyethylene glycol (MIRALAX / GLYCOLAX) packet 17 g  17 g Oral Daily PRN Karmen Bongo, MD      . sodium chloride flush (NS) 0.9 % injection 3 mL  3 mL Intravenous Q12H Karmen Bongo, MD      . sorbitol 70 % solution 30 mL  30 mL Oral PRN Karmen Bongo, MD      . Derrill Memo ON 11/15/2020] vancomycin (VANCOREADY) IVPB 500 mg/100 mL  500 mg Intravenous Q T,Th,Sa-HD Karmen Bongo, MD      . zolpidem [facility]) tablet 5 mg  5 mg Oral QHS PRN Karmen Bongo, MD       Labs: Basic  Metabolic Panel: Recent Labs  Lab 11/08/20 1409 11/07/2020 1104 10/29/2020 1237 10/18/2020 1636 11/19/2020 0217  NA 134* 131* 131* 133* 131*  K 4.2 5.4* 5.2* 4.9 5.3*  CL 95* 94*  --   --  91*  CO2 27 21*  --   --  23  GLUCOSE 110* 113*  --   --  131*  BUN 41* 57*  --   --  58*  CREATININE 5.97* 6.44*  --   --  6.29*  CALCIUM 6.9* 6.4*  --   --  6.4*   CBC: Recent Labs  Lab 11/08/20 1409 10/22/2020 1104 10/16/2020 1237 10/14/2020 1636 12/01/2020 0217  WBC 10.1 7.9  --   --  7.1  NEUTROABS 9.6* 7.7  --   --   --   HGB 9.9* 10.7* 12.2 10.5* 9.2*  HCT 30.2* 33.8* 36.0 31.0* 28.1*  MCV 85.3 87.6  --   --  84.4  PLT 193 143*  --   --  116*   CBG: Recent Labs  Lab 10/19/2020 1806 12/03/2020 0120 11/18/2020 0200 12/13/2020 0729  GLUCAP 77 50* 168* 79   Studies/Results: DG Chest 2 View  Result Date: 10/20/2020 CLINICAL DATA:  Shortness of breath.  Difficulty swallowing. EXAM: CHEST - 2 VIEW COMPARISON:  11/08/2020 FINDINGS: Stable enlargement of the cardiac silhouette. Stable densities at left lung base are suggestive for left pleural fluid. Right lung remains clear. No overt pulmonary edema. Trachea is midline. Atherosclerotic calcifications at the aortic arch. IMPRESSION: 1. Persistent left basilar chest densities. Findings are compatible with combination with a small pleural effusion and atelectasis. Minimal change from the previous examination. 2. Stable enlargement of the cardiac silhouette. Electronically Signed   By: Markus Daft M.D.   On: 11/12/2020 12:00   DG Femur Min 2 Views Left  Result Date: 11/12/2020 CLINICAL DATA:  Amputation. EXAM: LEFT FEMUR 2 VIEWS COMPARISON:  11/08/2020. FINDINGS: Prior AKA. Mild skin irregularity about the distal stump again noted. No acute or focal bony abnormality. Peripheral vascular calcification. Surgical clips in the pelvis. Calcified density in pelvis most likely calcified fibroid. IMPRESSION: 1. Prior AKA. Soft tissue irregularity is again noted about  distal stump. No acute bony or focal abnormality. 2.  Peripheral vascular disease. Electronically Signed   By: Marcello Moores  Register   On: 11/08/2020 12:01  Outpatient Dialysis Orders:  Center: [place] on TTS. 180NRe, 4 hours, BFR 450, DFR Auto 1.5, EDW 50.5kg, 2K, 2Ca, AVF 15g No heparin Mircera 200 mcg IV q 2 weeks- last dose 11/01/20 Calcitriol 2.0 mcg PO q HD Binder: calcium acetate 3 tabs po TID with meals and 2 with snacks, renvela 3 tabs PO TID with meals  BP meds: Amlodipine 17m QD, Clonidine 0.133mPO BID, Hydralazine 10020mID, Imdur ER 11m63m  Assessment/Plan: 1.  Sepsis from amputation site infection: Presented w/ infection of L AKA stump refractory to outpatient doxy. On empiric antibiotics, Dr. DudaSharol Givensulted and planning for L AKA 2. Dysphagia: Reports dysphagia in the ED, hx GI bleed, holding heparin with HD. Per primary team.  3.  ESRD:  Dialyzes on TTS schedule, missed HD yesterday due to ED visit. K+ 5.3.  No pulm edema on CXR. Will plan for HD today then resume regular schedule tomorrow.  4.  Hypertension/volume: Hypotensive overnight, likely due to #1. Received 1L LR yesterday and having some SOB today. Home BP meds on hold and midodrine 10mg3m was ordered, likely will not need midodrine long term, expect BP to improve as infection clears. Will attempt UF as tolerated with HD if BP improves.  5.  Anemia: Hgb 9.2. Will be due for next ESA dose tomorrow. Holding iron in the setting of acute infection.  6.  Metabolic bone disease: Calcium is low, using high Ca bath today and will check albumin and phos with HD. Has two binders ordered- continue calcium acetate only for now as this will also improve Ca levels.  7.  Nutrition:  Checking albumin 8. DM: SSI per priamry team  SamanAnice PaganiniC 12/10/2020, 8:15 AM  [facility] Pager: [phone]

## 2020-11-14 NOTE — Anesthesia Procedure Notes (Signed)
Procedure Name: LMA Insertion Date/Time: 11/16/2020 2:37 PM Performed by: Genelle Bal, CRNA Pre-anesthesia Checklist: Patient identified, Emergency Drugs available, Suction available and Patient being monitored Patient Re-evaluated:Patient Re-evaluated prior to induction Oxygen Delivery Method: Circle system utilized Preoxygenation: Pre-oxygenation with 100% oxygen Induction Type: IV induction Ventilation: Mask ventilation without difficulty LMA: LMA inserted LMA Size: 4.0 Number of attempts: 1 Airway Equipment and Method: Bite block Placement Confirmation: positive ETCO2 Tube secured with: Tape Dental Injury: Teeth and Oropharynx as per pre-operative assessment

## 2020-11-14 NOTE — Telephone Encounter (Signed)
Thank you.  Please order home health evaluation; pt is declining, but need to get dialysis 3x/ week as her kidney failure can be causing many of these symptoms.   Dr. Jonni Sanger

## 2020-11-14 NOTE — Progress Notes (Signed)
Initial Nutrition Assessment  DOCUMENTATION CODES:   Non-severe (moderate) malnutrition in context of chronic illness  INTERVENTION:   -Once diet is advanced, add:   -Boost Breeze po TID, each supplement provides 250 kcal and 9 grams of protein -30 ml Prosource Plus TID, each supplement provides 100 kcals and 15 grams protein -Continue rneal MVI daily   NUTRITION DIAGNOSIS:   Moderate Malnutrition related to chronic illness (ESRD on HD) as evidenced by mild fat depletion,moderate fat depletion,mild muscle depletion,moderate muscle depletion.  GOAL:   Patient will meet greater than or equal to 90% of their needs  MONITOR:   PO intake,Supplement acceptance,Labs,Weight trends,Skin,I & O's  REASON FOR ASSESSMENT:   Consult Wound healing  ASSESSMENT:   Brandy Houston is a 65 y.o. 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  She is very ill-appearing at this time and difficult to understand with quiet speech and the Bair hugger in place.  She reports chronic difficulty with her left stump and with infection there as well as recent progressive dysphagia.  Her son reports that they have been taking antibiotics for the stump infection and it hasn't been getting better faster enough for him.  Pt admitted with sepsis from amputation site infection.  Reviewed I/O's: +2.6 L x 24 hours and  Per orthopedics notes, plan for lt AKA revision today. Pt currently NPO for procedure.   Spoke with pt at bedside, who was not very talkative at time of visit. She reports a poor appetite over the past 6 weeks, consuming one meal per day (ex oatmeal and eggs).   Per pt, she reports she has lot about 34 pounds during this time period. Per nephrology notes, EDW 50.5 kg. Reviewed wt hx; pt has experienced a 5.2% wt loss over the past 6  months, which is not significant for time frame. Per pt, she has not been tolerating HD treatments well PTA secondary to hip pain.   Education deferred at this time secondary to mental status.   Pt with increased nutritional needs for wound healing and malnutrition and would benefit from addition of oral nutrition supplements. Per previous RD notes, pt tolerates Boost Breeze supplements well.   Medications reviewed and include phoslo, ferrous sulfate, solu-cortef, and 0.9% sodium chloride infusion @ 10 ml/hr.  Labs reviewed: CBGS: [id number] (inpatient orders for glycemic control are 0-6 units insulin aspart TID).   NUTRITION - FOCUSED PHYSICAL EXAM:  Flowsheet Row Most Recent Value  Orbital Region Mild depletion  Upper Arm Region Moderate depletion  Thoracic and Lumbar Region No depletion  Buccal Region Mild depletion  Temple Region Moderate depletion  Clavicle Bone Region Moderate depletion  Clavicle and Acromion Bone Region Moderate depletion  Scapular Bone Region No depletion  Dorsal Hand Mild depletion  Patellar Region Mild depletion  Anterior Thigh Region Mild depletion  Posterior Calf Region Mild depletion  Edema (RD Assessment) Mild  Hair Reviewed  Eyes Reviewed  Mouth Reviewed  Skin Reviewed  Nails Reviewed       Diet Order:   Diet Order            Diet renal/carb modified with fluid restriction Diet-HS Snack? Nothing; Fluid restriction: 1200 mL Fluid; Room service appropriate? Yes; Fluid consistency: Thin  Diet effective now  EDUCATION NEEDS:   Not appropriate for education at this time  Skin:  Skin Assessment: Skin Integrity Issues: Skin Integrity Issues:: Wound VAC Wound Vac: lt leg incision  Last BM:  11/12/20  Height:   Ht Readings from Last 1 Encounters:  12/09/2020 5\' 7"  (1.702 m)    Weight:   Wt Readings from Last 1 Encounters:  11/30/2020 58.1 kg    Ideal Body Weight:  56.5 kg (adjusted for lt AKA)  BMI:  Body mass index is  20.05 kg/m.  Estimated Nutritional Needs:   Kcal:  1850-2050  Protein:  105-120 grams  Fluid:  1000 ml + UOP    Loistine Chance, RD, LDN, [place] Registered Dietitian II Certified Diabetes Care and Education Specialist Please refer to [facility] [place] for RD and/or RD on-call/weekend/after hours pager

## 2020-11-14 NOTE — Procedures (Signed)
Central Venous Catheter Insertion Procedure Note  Brandy Houston  [id number]  04-28-1956  Date:12/13/2020  Time:9:21 PM   Provider Performing:Carrington Mullenax Jerilynn Mages Ayesha Rumpf   Procedure: Insertion of Non-tunneled Central Venous Catheter(36556)with US guidance (49702)    Indication(s) Medication administration, Difficult access and Hemodialysis  Consent Unable to obtain consent due to emergent nature of procedure.  Anesthesia Topical only with 1% lidocaine   Timeout Verified patient identification, verified procedure, site/side was marked, verified correct patient position, special equipment/implants available, medications/allergies/relevant history reviewed, required imaging and test results available.  Sterile Technique Maximal sterile technique including full sterile barrier drape, hand hygiene, sterile gown, sterile gloves, mask, hair covering, sterile ultrasound probe cover (if used).       Procedure Description Area of catheter insertion was cleaned with chlorhexidine and draped in sterile fashion.   With real-time ultrasound guidance a HD catheter was placed into the left internal jugular vein.  Nonpulsatile blood flow and easy flushing noted in all ports.  The catheter was sutured in place and sterile dressing applied.  Complications/Tolerance None; patient tolerated the procedure well. Chest X-ray is ordered to verify placement for internal jugular or subclavian cannulation.  Chest x-ray is not ordered for femoral cannulation.  EBL Minimal  Specimen(s) None  Lestine Mount, [place] [facility] 12/05/2020 9:22 PM  Please see [email or web address] for pager details.  From 7A-7P if no response, please call [phone] After hours, please call ELink [phone]

## 2020-11-14 NOTE — Telephone Encounter (Signed)
Home health order has been placed but is being held since patient has been admitted to the ED.

## 2020-11-14 NOTE — Progress Notes (Signed)
Palliative-   Consult received and chart reviewed.   Unable to see patient today due to being off the floor for surgery.  Called patient's daughter to discuss time for goals of care meeting (per chart review patient's daughter is primary care taker).   [name] was not aware that patient was admitted and was very upset that she had not been notified. I gave [name] an update on patient.   Plan made to meet tomorrow at 2pm.  Primary contact changed to [facility].   Mariana Kaufman, AGNP-C Palliative Medicine  No charge

## 2020-11-14 NOTE — Plan of Care (Signed)

## 2020-11-14 NOTE — Progress Notes (Signed)
PROGRESS NOTE    Brandy Houston  WUJ:[id number] DOB: [birth year] DOA: 11/08/2020 PCP: [name], MD    No chief complaint on file.   Brief Narrative:   HPI: Brandy Houston is a 65 y.o. 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  She is very ill-appearing at this time and difficult to understand with quiet speech and the Bair hugger in place.  She reports chronic difficulty with her left stump and with infection there as well as recent progressive dysphagia.  Her son reports that they have been taking antibiotics for the stump infection and it hasn't been getting better faster enough for him.  Patient was admitted with sepsis, hypoglycemia, and was seen by orthopedic, plan to go to the OR today for revision of left amputation site.   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  h/o GI Bleeding, now with dysphagia -Patientwith h/o recurrent GI bleeding -Denies current bleeding, Hgb appears to be stable -Continue misoprostol -However, she is complaining of dysphagia -May need further evaluation once more stable from a sepsis standpoint -h/o  candidiasis - none seen on initial exam but low threshold for starting treatment  ESRD on TTS HD -Renal consulted, to continue hemodialysis during hospital stay  DM, uncontrolled with hyperglycemia -Last A1c was <5  -She is with hyperglycemia, she is not on any insulin, this is most likely in the setting of sepsis and n.p.o. status due to surgery  CAD/PVD -s/p L AKA -Continue Imdur  HLD -Continue Lipitor  HTN -Hold BP meds due to borderline low BP in the ER -Home meds are currently hydralazine, Toprol XL  Mood d/o -Continue nortriptyline    DVT prophylaxis: Heparin Code Status: Full Family Communication: D/W son Brandy Houston by Phone 6/1 Disposition:   Status is: Inpatient  Remains inpatient appropriate because:IV treatments appropriate due to intensity of illness or inability to take PO   Dispo: The patient is from: Home              Anticipated d/c is to: SNF              Patient currently is not medically stable to d/c.   Difficult to place patient No       Consultants:  Renal  Orthopedic  Subjective: Patient report she is feeling ill, mildly nauseous, has no appetite, denies any chest pain or shortness of breath  Objective: Vitals:   12/08/2020 0628 11/28/2020 0726 12/11/2020 1200 11/27/2020 1303  BP:  (!) 82/55 (!) 88/61   Pulse:  75 74   Resp: 16 19 16    Temp:  97.6 F (36.4 C) 97.8 F (36.6 C)   TempSrc:  Oral Oral   SpO2:  95% 91%   Weight:    58.1 kg  Height:    5\' 7"  (1.702 m)    Intake/Output Summary (Last 24 hours) at 12/04/2020 1319 Last data filed at 11/20/2020 1148 Gross per 24 hour  Intake 3114.71 ml  Output --  Net 3114.71 ml   Filed Weights   11/19/2020 1303  Weight: 58.1 kg    Examination:  General exam: Awake, alert, sick appearing, deconditioned Respiratory system: Clear to auscultation. Respiratory effort normal. Cardiovascular system: S1 & S2 heard, RRR. No JVD, murmurs, rubs, gallops or clicks. No pedal  edema. Gastrointestinal system: Abdomen is nondistended, soft and nontender. No organomegaly or masses felt. Normal bowel sounds heard. Central nervous system: Alert and oriented. No focal neurological deficits. Extremities: Left stump with discharge and dehiscence    Data Reviewed: I have personally reviewed following labs and imaging studies  CBC: Recent Labs  Lab 11/08/20 1409 10/20/2020 1104 10/16/2020 1237 10/23/2020 1636 12/11/2020 0217  WBC 10.1 7.9  --   --  7.1  NEUTROABS 9.6* 7.7  --   --   --   HGB 9.9* 10.7* 12.2 10.5* 9.2*  HCT 30.2* 33.8* 36.0 31.0* 28.1*  MCV 85.3 87.6  --   --  84.4  PLT 193 143*  --   --  116*    Basic Metabolic Panel: Recent Labs  Lab 11/08/20 1409 10/29/2020 1104 11/01/2020 1237 11/07/2020 1636 11/15/2020 0217  NA 134* 131* 131* 133* 131*  K 4.2 5.4* 5.2* 4.9 5.3*  CL 95* 94*  --   --  91*  CO2 27 21*  --   --  23  GLUCOSE 110* 113*  --   --  131*  BUN 41* 57*  --   --  58*  CREATININE 5.97* 6.44*  --   --  6.29*  CALCIUM 6.9* 6.4*  --   --  6.4*    GFR: Estimated Creatinine Clearance: 8.1 mL/min (A) (by C-G formula based on SCr of 6.29 mg/dL (H)).  Liver Function Tests: No results for input(s): AST, ALT, ALKPHOS, BILITOT, PROT, ALBUMIN in the last 168 hours.  CBG: Recent Labs  Lab 11/24/2020 1205 11/30/2020 1209 12/12/2020 1222 12/11/2020 1229 11/29/2020 1235  GLUCAP <10* 43* 49* 63* 69*     Recent Results (from the past 240 hour(s))  Culture, blood (routine x 2)     Status: None (Preliminary result)   Collection Time: 10/15/2020 12:30 PM   Specimen: BLOOD RIGHT HAND  Result Value Ref Range Status   Specimen Description BLOOD RIGHT HAND  Final   Special Requests   Final    BOTTLES DRAWN AEROBIC ONLY Blood Culture results may not be optimal due to an inadequate volume of blood received in culture bottles   Culture   Final    NO GROWTH < 24 HOURS Performed at [facility], [place] [address] [place], [place] [address]    Report Status  PENDING  Incomplete  Culture, blood (routine x 2)     Status: None (Preliminary result)   Collection Time: 11/01/2020  1:00 PM   Specimen: BLOOD LEFT FOREARM  Result Value Ref Range Status   Specimen Description BLOOD LEFT FOREARM  Final   Special Requests   Final    BOTTLES DRAWN AEROBIC AND ANAEROBIC Blood Culture results may not be optimal due to an inadequate volume of blood received in culture bottles   Culture   Final    NO GROWTH < 24 HOURS Performed at [facility], [place] [address], [place], [place] [address]  Report Status PENDING  Incomplete  Resp Panel by RT-PCR (Flu A&B, Covid) Nasopharyngeal Swab     Status: None   Collection Time: 10/25/2020  2:00 PM   Specimen: Nasopharyngeal Swab; Nasopharyngeal(NP) swabs in vial transport medium  Result Value Ref Range Status   SARS Coronavirus 2 by RT PCR NEGATIVE NEGATIVE Final    Comment: (NOTE) SARS-CoV-2 target nucleic acids are NOT DETECTED.  The SARS-CoV-2 RNA is generally detectable in upper respiratory specimens during the acute phase of infection. The lowest concentration of SARS-CoV-2 viral copies this assay can detect is 138 copies/mL. A negative result does not preclude SARS-Cov-2 infection and should not be used as the sole basis for treatment or other patient management decisions. A negative result may occur with  improper specimen collection/handling, submission of specimen other than nasopharyngeal swab, presence of viral mutation(s) within the areas targeted by this assay, and inadequate number of viral copies(<138 copies/mL). A negative result must be combined with clinical observations, patient history, and epidemiological information. The expected result is Negative.  Fact Sheet for Patients:  EntrepreneurPulse.com.au  Fact Sheet for Healthcare Providers:  IncredibleEmployment.be  This test is no t yet approved or cleared by the [place] FDA and  has been  authorized for detection and/or diagnosis of SARS-CoV-2 by FDA under an Emergency Use Authorization (EUA). This EUA will remain  in effect (meaning this test can be used) for the duration of the COVID-19 declaration under Section 564(b)(1) of the Act, 21 U.S.C.section 360bbb-3(b)(1), unless the authorization is terminated  or revoked sooner.       Influenza A by PCR NEGATIVE NEGATIVE Final   Influenza B by PCR NEGATIVE NEGATIVE Final    Comment: (NOTE) The Xpert Xpress SARS-CoV-2/FLU/RSV plus assay is intended as an aid in the diagnosis of influenza from Nasopharyngeal swab specimens and should not be used as a sole basis for treatment. Nasal washings and aspirates are unacceptable for Xpert Xpress SARS-CoV-2/FLU/RSV testing.  Fact Sheet for Patients: EntrepreneurPulse.com.au  Fact Sheet for Healthcare Providers: IncredibleEmployment.be  This test is not yet approved or cleared by the [place] FDA and has been authorized for detection and/or diagnosis of SARS-CoV-2 by FDA under an Emergency Use Authorization (EUA). This EUA will remain in effect (meaning this test can be used) for the duration of the COVID-19 declaration under Section 564(b)(1) of the Act, 21 U.S.C. section 360bbb-3(b)(1), unless the authorization is terminated or revoked.  Performed at [facility], [place] [address], [place], [place] [address]          Radiology Studies: DG Chest 2 View  Result Date: 11/09/2020 CLINICAL DATA:  Shortness of breath.  Difficulty swallowing. EXAM: CHEST - 2 VIEW COMPARISON:  11/08/2020 FINDINGS: Stable enlargement of the cardiac silhouette. Stable densities at left lung base are suggestive for left pleural fluid. Right lung remains clear. No overt pulmonary edema. Trachea is midline. Atherosclerotic calcifications at the aortic arch. IMPRESSION: 1. Persistent left basilar chest densities. Findings are compatible with combination  with a small pleural effusion and atelectasis. Minimal change from the previous examination. 2. Stable enlargement of the cardiac silhouette. Electronically Signed   By: Markus Daft M.D.   On: 11/02/2020 12:00   DG Femur Min 2 Views Left  Result Date: 10/28/2020 CLINICAL DATA:  Amputation. EXAM: LEFT FEMUR 2 VIEWS COMPARISON:  11/08/2020. FINDINGS: Prior AKA. Mild skin irregularity about the distal stump again noted. No acute or focal bony abnormality. Peripheral vascular calcification. Surgical clips in the pelvis. Calcified  density in pelvis most likely calcified fibroid. IMPRESSION: 1. Prior AKA. Soft tissue irregularity is again noted about distal stump. No acute bony or focal abnormality. 2.  Peripheral vascular disease. Electronically Signed   By: [place]   On: 11/11/2020 12:01        Scheduled Meds: . [MAR Hold] atorvastatin  10 mg Oral QHS  . [MAR Hold] calcitRIOL  2 mcg Oral Q T,Th,Sa-HD  . [MAR Hold] calcium acetate  1,334 mg Oral TID WC   And  . [MAR Hold] calcium acetate  667 mg Oral With snacks  . chlorhexidine  15 mL Mouth/Throat NOW  . chlorhexidine      . [MAR Hold] Chlorhexidine Gluconate Cloth  6 each Topical Q0600  . [MAR Hold] ferrous sulfate  325 mg Oral Daily  . [MAR Hold] heparin  5,000 Units Subcutaneous Q8H  . [MAR Hold] insulin aspart  0-6 Units Subcutaneous TID WC  . [MAR Hold] isosorbide mononitrate  30 mg Oral Daily  . [MAR Hold] midodrine  10 mg Oral TID WC  . [MAR Hold] misoprostol  100 mcg Oral Q6H  . [MAR Hold] multivitamin  1 tablet Oral q AM  . [MAR Hold] nortriptyline  30 mg Oral QHS  . [MAR Hold] pantoprazole  40 mg Oral Daily  . [MAR Hold] sodium chloride flush  3 mL Intravenous Q12H   Continuous Infusions: . sodium chloride    . [START ON 11/15/2020]  ceFAZolin (ANCEF) IV    . [MAR Hold] ceFEPime (MAXIPIME) IV Stopped (10/15/2020 1959)  . dextrose 5 % and 0.45% NaCl 50 mL/hr at 11/24/2020 1227  . [MAR Hold] metronidazole Stopped (12/09/2020  1027)  . [MAR Hold] vancomycin       LOS: 1 day      Phillips Climes, MD [facility]   To contact the attending provider between 7A-7P or the covering provider during after hours 7P-7A, please log into the web site [email or web address] and access using universal [place] password for that web site. If you do not have the password, please call the hospital operator.  11/20/2020, 1:19 PM

## 2020-11-14 NOTE — Transfer of Care (Signed)
Immediate Anesthesia Transfer of Care Note  Patient: AUTYM SIESS  Procedure(s) Performed: AMPUTATION ABOVE KNEE (Left Knee) APPLICATION OF WOUND VAC (Left Leg Upper)  Patient Location: PACU  Anesthesia Type:General  Level of Consciousness: unresponsive  Airway & Oxygen Therapy: Patient Spontanous Breathing and Patient connected to face mask oxygen  Post-op Assessment: Report given to RN and Post -op Vital signs reviewed and stable  Post vital signs: Reviewed and stable  Last Vitals:  Vitals Value Taken Time  BP 91/53 12/11/2020 1549  Temp    Pulse 78   Resp 25 12/01/2020 1553  SpO2    Vitals shown include unvalidated device data.  Last Pain:  Vitals:   11/22/2020 1200  TempSrc: Oral  PainSc:          Complications: No complications documented.

## 2020-11-14 NOTE — Consult Note (Signed)
NAME:  Brandy Houston, MRN:  [id number], DOB:  [birth year], LOS: 1 ADMISSION DATE:  11/05/2020, CONSULTATION DATE:  12/06/2020 REFERRING MD:  Trellis Moment MD, Reason for Consult:  Hypotension  History of Present Illness:  Per chart review, Brandy Houston  is a 65 y.o. F who presented to [facility] on 5/31 with concerns of left stump infection.   She has a past medical history of DM, PVD S/P AKA, ESRD on HD, DM, CAD, HTN, and recurrent GIB.  A sepsis workup was initated in the ED. Vanc, Cefepime, and Flagyl were started after [facility] were drawn. TRH admitted for evaluation. Per report she had missed previously on HD session. Orthopaedics were consulted and felt that Brandy Houston was an operative candidate.   She underwent a revision of the left AKA with Dr. Sharol Given on 6/1.  She was extubated and taken to the PACU. In the PACU she was found to be hypotensive, Hypoglycemic with a BG less than 10. 2 Amps of dextrose was administered with improvement to greater than 300. 2U PRBC were ordered and she was started on peripheral neo.  PCCM was consulted for assistance and transfer to ICU.   Pertinent  Medical History  DM, PVD S/P AKA, ESRD on HD, DM, CAD, HTN,  recurrent GIB, Hep C   Significant Hospital Events: Including procedures, antibiotic start and stop dates in addition to other pertinent events   . 5/31 Admit. BC>  Vanc,. Cefepime, Zosyn >> . 6/1 OR for L AKA. Hypotensive 2U PRBC admin, hypoglycemic. PCCM consult. Transfer to ICU.  Interim History / Subjective:  See above  Unable to obtain subjective evaluation due to patient status  Objective   Blood pressure 102/60, pulse 70, temperature (!) 96.8 F (36 C), resp. rate 16, height _0  (1.702 m), weight 58.1 kg, last menstrual period 03/16/2002, SpO2 91 %. on NRB        Intake/Output Summary (Last 24 hours) at 11/26/2020 1720 Last data filed at 11/16/2020 1640 Gross per 24 hour  Intake 4579.51 ml  Output 50 ml  Net 4529.51 ml   Filed Weights   12/06/2020  1303  Weight: 58.1 kg    Examination: General:  ill appearing, frail, in bed  HEENT: MM dry, anicteric, trachea midline  Neuro: Withdraws from pain, no eye opening, incomprehensible, RASS, RT pupil 5 and reactive, L 3 reactive CV: S1S2, NSR on monitor, no m/r/g appreciated PULM:  coarse in the upper lobes in the lower lobes, air movement in all lobes, chest expansion symmetric GI: soft, bsx4 active, non distended Extremities: warm, skin dry, no pretibial edema,  Skin: new RT AKA wound with wound vac. No output in vac.   Labs/imaging that I havepersonally reviewed  (right click and "Reselect all SmartList Selections" daily)  CBC BMP Lactate BG PCT  Resolved Hospital Problem list     Assessment & Plan:   Septic Shock vs Hypovolemic Shock S/P LLE AKA with concern for wound infection. Lactate 2.0 on 5/31. PCT 6.11. AG 17. Suspect multifactorial shock with possible infection of stump and uremic platelets contributing to bleeding. -Continue Vancomycin, Cefepime, Flagyl -Obtain ABG, Troponin, EKG to r/o intraoperative MI -Start peripheral levophed. Goal MAP 65. Titrate medication to goal -2U prbc ordered post surgery, obtain CBC post cbc. Holding on fluid administration in the setting of renal failure.  -Follow up Blood cultures and urine cultures -Continue stress dose steroids. 142m solucortef q8h -Will evaluate patient post blood administration for need for central access.  -Follow  up on CMP. Monitor Calcium level. Consider calcium administration.  -Goal normothermia. Continue to rewarm with bair hugger.  Anemia of Chronic Illness vs Acute Blood Loss Anemia HBG appears to be around  7.5-9 at baseline. Hx of GI AVM's. 2 units PRBC 6/1 -Transfuse PRBC if HBG less than 7 -assess FOBT if concerns for bleeding -Obtain AM CBC to trend H&H and post transfusion cbc  Acute Metabolic Encephalopathy S/P surgery, hypotension, and hypoglycemia -Monitor neuro exam. -Start D10 gtt at 50  for hypoglycemia  -frequent neuro exams  Acute Respiratory Failure with Hypoxia ?secondary to blood loss vs sedation post surgery with atelectasis   -Goal SPO2 92-98. Wean O2 as able -Obtain ABG -aspiration precautions   ESRD on TTS HD Was previously on HD, missed most recent session prior to admit -Renal consulted, appreciate assistance -Unclear if BP will improve post blood admin. Could be hypovolemic component post surgery. -may need CRRT if BP will not permit UF  Hypoglycemia BG of less than 10 documented intraop, received two amps of dextrose and improved to 352 Hx DM -Start D10 gtt at 50 -Blood Glucose goal 140-180. -CBG Q1 x4   Hyponatremia -Continue NPO, no free water -AM BMP for NA level  CAD/PVD -Hold imdur in the setting of hypotension  Hx HTN -hold all home antihypertensives  H/O GIB -Continue monitoring for signs of blood loss  HLD -Continue lipitor  Mood disorder -Continue nortriptyline    Best practice (right click and "Reselect all SmartList Selections" daily)  Diet:  NPO Pain/Anxiety/Delirium protocol (if indicated): No VAP protocol (if indicated): Not indicated DVT prophylaxis: SCD GI prophylaxis: PPI Glucose control:  SSI Yes Central venous access:  N/A Arterial line:  N/A Foley:  N/A Mobility:  bed rest  PT consulted: N/A Last date of multidisciplinary goals of care discussion: Pending Palliative Care discussion for 6/2 Code Status:  full code Disposition: ICU  Labs   CBC: Recent Labs  Lab 11/08/20 1409 11/02/2020 1104 10/16/2020 1237 11/05/2020 1636 11/16/2020 0217  WBC 10.1 7.9  --   --  7.1  NEUTROABS 9.6* 7.7  --   --   --   HGB 9.9* 10.7* 12.2 10.5* 9.2*  HCT 30.2* 33.8* 36.0 31.0* 28.1*  MCV 85.3 87.6  --   --  84.4  PLT 193 143*  --   --  116*    Basic Metabolic Panel: Recent Labs  Lab 11/08/20 1409 11/12/2020 1104 10/23/2020 1237 11/01/2020 1636 11/27/2020 0217  NA 134* 131* 131* 133* 131*  K 4.2 5.4* 5.2* 4.9 5.3*  CL  95* 94*  --   --  91*  CO2 27 21*  --   --  23  GLUCOSE 110* 113*  --   --  131*  BUN 41* 57*  --   --  58*  CREATININE 5.97* 6.44*  --   --  6.29*  CALCIUM 6.9* 6.4*  --   --  6.4*   GFR: Estimated Creatinine Clearance: 8.1 mL/min (A) (by C-G formula based on SCr of 6.29 mg/dL (H)). Recent Labs  Lab 11/08/20 1409 11/04/2020 1104 10/22/2020 1230 10/25/2020 1603 10/25/2020 1700 10/28/2020 2035 11/18/2020 0217  PROCALCITON  --   --   --   --  6.11  --   --   WBC 10.1 7.9  --   --   --   --  7.1  LATICACIDVEN  --   --  2.6* 2.1*  --  2.0*  --  Liver Function Tests: No results for input(s): AST, ALT, ALKPHOS, BILITOT, PROT, ALBUMIN in the last 168 hours. No results for input(s): LIPASE, AMYLASE in the last 168 hours. No results for input(s): AMMONIA in the last 168 hours.  ABG    Component Value Date/Time   PHART 7.383 04/13/2020 1001   PCO2ART 53.2 (H) 04/13/2020 1001   PO2ART 145 (H) 04/13/2020 1001   HCO3 28.2 (H) 11/02/2020 1636   TCO2 30 10/23/2020 1636   O2SAT 84.0 11/11/2020 1636     Coagulation Profile: No results for input(s): INR, PROTIME in the last 168 hours.  Cardiac Enzymes: No results for input(s): CKTOTAL, CKMB, CKMBINDEX, TROPONINI in the last 168 hours.  HbA1C: Hemoglobin A1C  Date/Time Value Ref Range Status  06/26/2017 12:00 AM 5.3  Final   Hgb A1c MFr Bld  Date/Time Value Ref Range Status  06/21/2020 02:39 AM 4.8 4.8 - 5.6 % Final    Comment:    (NOTE) Pre diabetes:          5.7%-6.4%  Diabetes:              >6.4%  Glycemic control for   <7.0% adults with diabetes   04/14/2020 04:47 AM 4.6 (L) 4.8 - 5.6 % Final    Comment:    (NOTE) Pre diabetes:          5.7%-6.4%  Diabetes:              >6.4%  Glycemic control for   <7.0% adults with diabetes     CBG: Recent Labs  Lab 11/20/2020 1235 11/22/2020 1554 11/24/2020 1622 12/07/2020 1624 12/10/2020 1635  GLUCAP 69* <10* <10* <10* 352*    Review of Systems:   Unable to obtain a review of  systems due to patient status  Past Medical History:  She,  has a past medical history of Allergy, Anemia, Anxiety, Arthritis, Chronic diastolic CHF (congestive heart failure) ([place]), Depression, Diabetic peripheral neuropathy ([place]), Dysrhythmia, ESRD (end stage renal disease) ([place]), GERD (gastroesophageal reflux disease), Heart murmur, Hepatitis C, History of blood transfusion (2017), Hyperlipidemia, Hypertension, Peripheral neuropathy, Protein calorie malnutrition ([place]), Septic arthritis ([place]), Slow transit constipation, Symptomatic anemia (01/27/2020), Type II diabetes mellitus ([place]), and Uncontrolled hypertension (02/18/2013).   Surgical History:   Past Surgical History:  Procedure Laterality Date  . A/V FISTULAGRAM Right 07/07/2018   Procedure: A/V FISTULAGRAM;  Surgeon: Marty Heck, MD;  Location: [place] [facility];  Service: Cardiovascular;  Laterality: Right;  . AMPUTATION Left 03/20/2017   Procedure: LEFT ABOVE KNEE AMPUTATION;  Surgeon: Newt Minion, MD;  Location: [place];  Service: Orthopedics;  Laterality: Left;  . APPENDECTOMY    . AV FISTULA PLACEMENT Left 09/13/2014   Procedure: Brachial Artery to Brachial Vein Gortex Four - Seven Stretch GRAFT INSERTION Left Forearm;  Surgeon: Mal Misty, MD;  Location: [place];  Service: Vascular;  Laterality: Left;  . BASCILIC VEIN TRANSPOSITION Right 10/26/2017   Procedure: BASILIC VEIN TRANSPOSITION FIRST STAGE RIGHT ARM;  Surgeon: Conrad Pattonsburg, MD;  Location: [place];  Service: Vascular;  Laterality: Right;  . BASCILIC VEIN TRANSPOSITION Right 02/01/2018   Procedure: SECOND STAGE BASILIC VEIN TRANSPOSITION RIGHT UPPER EXTREMITY;  Surgeon: Marty Heck, MD;  Location: [place];  Service: Vascular;  Laterality: Right;  . CHOLECYSTECTOMY OPEN    . COLON SURGERY    . COLONOSCOPY    . ENTEROSCOPY N/A 05/28/2020   Procedure: ENTEROSCOPY;  Surgeon: Mauri Pole, MD;  Location: [facility]  ENDOSCOPY;  Service: Endoscopy;  Laterality: N/A;   . ENTEROSCOPY N/A 06/25/2020   Procedure: ENTEROSCOPY;  Surgeon: Milus Banister, MD;  Location: [facility] ENDOSCOPY;  Service: Gastroenterology;  Laterality: N/A;  . ESOPHAGOGASTRODUODENOSCOPY (EGD) WITH PROPOFOL N/A 05/14/2020   Procedure: ESOPHAGOGASTRODUODENOSCOPY (EGD) WITH PROPOFOL;  Surgeon: Mauri Pole, MD;  Location: [facility] ENDOSCOPY;  Service: Endoscopy;  Laterality: N/A;  with APC  . EXCISIONAL TOTAL KNEE ARTHROPLASTY WITH ANTIBIOTIC SPACERS Left 02/06/2017   Procedure: Incisional total iknee with antibiotic spacer ;  Surgeon: Leandrew Koyanagi, MD;  Location: [place];  Service: Orthopedics;  Laterality: Left;  . GIVENS CAPSULE STUDY N/A 09/09/2020   Procedure: GIVENS CAPSULE STUDY;  Surgeon: Jackquline Denmark, MD;  Location: [facility] ENDOSCOPY;  Service: Endoscopy;  Laterality: N/A;  . HOT HEMOSTASIS N/A 05/14/2020   Procedure: HOT HEMOSTASIS (ARGON PLASMA COAGULATION/BICAP);  Surgeon: Mauri Pole, MD;  Location: [facility];  Service: Endoscopy;  Laterality: N/A;  . HOT HEMOSTASIS N/A 05/28/2020   Procedure: HOT HEMOSTASIS (ARGON PLASMA COAGULATION/BICAP);  Surgeon: Mauri Pole, MD;  Location: [facility] ENDOSCOPY;  Service: Endoscopy;  Laterality: N/A;  . HOT HEMOSTASIS N/A 06/25/2020   Procedure: HOT HEMOSTASIS (ARGON PLASMA COAGULATION/BICAP);  Surgeon: Milus Banister, MD;  Location: [facility] ENDOSCOPY;  Service: Gastroenterology;  Laterality: N/A;  . INTRAVASCULAR PRESSURE WIRE/FFR STUDY N/A 04/13/2020   Procedure: INTRAVASCULAR PRESSURE WIRE/FFR STUDY;  Surgeon: Burnell Blanks, MD;  Location: [place] [facility];  Service: Cardiovascular;  Laterality: N/A;  . IR FLUORO GUIDE CV LINE RIGHT  02/10/2017  . IR REMOVAL TUN CV CATH W/O FL  04/01/2017  . IR US GUIDE VASC ACCESS RIGHT  02/10/2017  . IRRIGATION AND DEBRIDEMENT KNEE Left 03/12/2017   Procedure: IRRIGATION AND DEBRIDEMENT LEFT KNEE WITH WOUND VAC APPLICATION;  Surgeon: Leandrew Koyanagi, MD;  Location: [place];  Service: Orthopedics;   Laterality: Left;  . JOINT REPLACEMENT    . KNEE ARTHROSCOPY Right 08/10/2014   Procedure: ARTHROSCOPY I & D KNEE;  Surgeon: Marianna Payment, MD;  Location: [facility];  Service: Orthopedics;  Laterality: Right;  . KNEE ARTHROSCOPY Left 08/11/2014   Procedure: ARTHROSCOPIC WASHOUT LEFT KNEE;  Surgeon: Marianna Payment, MD;  Location: [place];  Service: Orthopedics;  Laterality: Left;  . KNEE ARTHROSCOPY Left 08/19/2014   Procedure: ARTHROSCOPIC WASHOUT LEFT KNEE;  Surgeon: Leandrew Koyanagi, MD;  Location: [place];  Service: Orthopedics;  Laterality: Left;  . KNEE ARTHROSCOPY WITH LATERAL MENISECTOMY Left 04/04/2015   Procedure: AND PARTIAL LATERAL MENISECTOMY;  Surgeon: Leandrew Koyanagi, MD;  Location: [employer];  Service: Orthopedics;  Laterality: Left;  . KNEE ARTHROSCOPY WITH MEDIAL MENISECTOMY Left 04/04/2015   Procedure: LEFT KNEE ARTHROSCOPY WITH PARTIAL MEDIAL MENISCECTOMY  AND SYNOVECTOMY;  Surgeon: Leandrew Koyanagi, MD;  Location: [place];  Service: Orthopedics;  Laterality: Left;  . PERIPHERAL VASCULAR BALLOON ANGIOPLASTY  07/07/2018   Procedure: PERIPHERAL VASCULAR BALLOON ANGIOPLASTY;  Surgeon: Marty Heck, MD;  Location: [place] [facility];  Service: Cardiovascular;;  right AV fistula  . POLYPECTOMY    . RIGHT/LEFT HEART CATH AND CORONARY ANGIOGRAPHY N/A 04/13/2020   Procedure: RIGHT/LEFT HEART CATH AND CORONARY ANGIOGRAPHY;  Surgeon: Burnell Blanks, MD;  Location: [place] [facility];  Service: Cardiovascular;  Laterality: N/A;  . SHOULDER ARTHROSCOPY Bilateral 08/10/2014   Procedure: I & D BILATERAL SHOULDERS ;  Surgeon: Marianna Payment, MD;  Location: [facility];  Service: Orthopedics;  Laterality: Bilateral;  .  SMALL INTESTINE SURGERY     Due to Small Bowel Obstruction; "fixed it when they did my gallbladder OR"  . TEE WITHOUT CARDIOVERSION N/A 08/14/2014   Procedure: TRANSESOPHAGEAL ECHOCARDIOGRAM (TEE);  Surgeon: Thayer Headings, MD;  Location: [place];  Service: Cardiovascular;  Laterality: N/A;  . TENOSYNOVECTOMY Right 08/11/2014   Procedure: RIGHT WRIST IRRIGATION AND DEBRIDEMENT, TENOSYNOVECTOMY;  Surgeon: Marianna Payment, MD;  Location: [place];  Service: Orthopedics;  Laterality: Right;  . TOTAL KNEE ARTHROPLASTY Left 11/07/2015   Procedure: LEFT TOTAL KNEE ARTHROPLASTY WITH REVISION OF IMPLANTS;  Surgeon: Leandrew Koyanagi, MD;  Location: [place];  Service: Orthopedics;  Laterality: Left;  . TUBAL LIGATION       Social History:   reports that she has been smoking cigarettes. She has a 22.50 pack-year smoking history. She has never used smokeless tobacco. She reports previous alcohol use. She reports that she does not use drugs.   Family History:  Her family history includes Cancer in her brother; Diabetes in her brother; Heart disease in her father; Hypertension in her brother and sister; Uterine cancer in her mother and sister. There is no history of Colon cancer, Colon polyps, Esophageal cancer, Rectal cancer, or Stomach cancer.   Allergies Allergies  Allergen Reactions  . Compazine [Prochlorperazine] Shortness Of Breath, Swelling and Other (See Comments)    TONGUE SWELLS  . Shellfish-Derived Products Anaphylaxis  . Iodinated Diagnostic Agents Hives and Rash  . Omnipaque [Iohexol] Hives  . Tape Other (See Comments)    [place], paper tape caused skin irritation  . Sulfa Antibiotics Rash     Home Medications  Prior to Admission medications   Medication Sig Start Date End Date Taking? Authorizing Provider  atorvastatin (LIPITOR) 10 MG tablet Take 1 tablet (10 mg total) by mouth at bedtime. 06/13/20  Yes [name], MD  calcium acetate (PHOSLO) 667 MG capsule Take [phone] mg by mouth See admin instructions. Take 1,334 mg by mouth three times a day with meals and 667 mg twice a day with each snack 01/13/19  Yes [provider]  diphenhydrAMINE (BENADRYL) 25 mg capsule Take 25 mg by mouth every 6 (six) hours as  needed for allergies.   Yes [provider]  diphenhydramine-acetaminophen (TYLENOL PM) 25-500 MG TABS tablet Take 1 tablet by mouth at bedtime.   Yes [provider]  doxycycline (VIBRAMYCIN) 100 MG capsule Take 1 capsule (100 mg total) by mouth 2 (two) times daily. 11/08/20  Yes Pattricia Boss, MD  Ensure (ENSURE) Take 237 mLs by mouth daily with breakfast.   Yes [provider]  hydrALAZINE (APRESOLINE) 100 MG tablet Take 100 mg by mouth in the morning and at bedtime.   Yes [provider]  isosorbide mononitrate (IMDUR) 30 MG 24 hr tablet Take 1 tablet (30 mg total) by mouth daily. 07/16/20 10/14/20 Yes Donato Heinz, MD  lidocaine-prilocaine (EMLA) cream Apply 1 application topically Every Tuesday,Thursday,and Saturday with dialysis. 10/25/18  Yes [provider]  metoprolol succinate (TOPROL XL) 25 MG 24 hr tablet Take 1 tablet (25 mg total) by mouth daily. 10/31/20  Yes Donato Heinz, MD  misoprostol (CYTOTEC) 100 MCG tablet TAKE 1 TABLET BY MOUTH EVERY 6 HOURS Patient taking differently: Take 100 mcg by mouth every 6 (six) hours. 10/29/20  Yes Fayrene Helper, MD  multivitamin (RENA-VIT) TABS tablet Take 1 tablet by mouth in the morning. 03/01/19  Yes [provider]  nitroGLYCERIN (NITROSTAT) 0.4 MG SL tablet  Place 1 tablet (0.4 mg total) under the tongue every 5 (five) minutes as needed. Patient taking differently: Place 0.4 mg under the tongue every 5 (five) minutes as needed for chest pain. 04/25/20 07/24/20 Yes Donato Heinz, MD  nortriptyline (PAMELOR) 10 MG capsule Take 3 capsules (30 mg total) by mouth at bedtime. 10/29/20  Yes Pieter Partridge, DO  Accu-Chek Softclix Lancets lancets Check sugars three times a day for E11.65 09/20/18   [name], MD  acetaminophen (TYLENOL) 500 MG tablet Take 1,000 mg by mouth every 6 (six) hours as needed for mild pain.    [provider]  blood glucose meter kit  and supplies KIT Dispense based on patient and insurance preference. Use up to four times daily as directed 10/13/19   [name], MD  Blood Glucose Monitoring Suppl (ACCU-CHEK GUIDE) w/Device KIT 1 Device by Does not apply route daily. 10/31/19   [name], MD  diltiazem 2 % GEL Apply 1 application topically 3 (three) times daily. Apply to affected area on anus Patient taking differently: Apply 1 application topically 3 (three) times daily as needed (for irritation on/in the anus). 06/25/20   Samuella Cota, MD  glucose blood (ACCU-CHEK GUIDE) test strip Check blood sugar up to 4 times a day 10/20/19   [name], MD  HYDROcodone-acetaminophen (NORCO) 5-325 MG tablet Take 1 tablet by mouth every 6 (six) hours as needed for moderate pain. 10/31/20   [name], MD  hydrocortisone (ANUSOL-HC) 25 MG suppository Place 1 suppository (25 mg total) rectally 2 (two) times daily. Patient taking differently: Place 25 mg rectally 2 (two) times daily as needed for hemorrhoids or anal itching. 06/25/20   Samuella Cota, MD  Iron, Ferrous Sulfate, 325 (65 Fe) MG TABS Take 325 mg by mouth daily. Note that this medication will turn stool black and may cause constipation. 06/25/20   Samuella Cota, MD  Lancet Devices [facility]) lancets Check sugars TID for E11.65 01/29/15   Lance Bosch, NP  pantoprazole (PROTONIX) 40 MG tablet Take 1 tablet (40 mg total) by mouth daily. 40 mg BID for 2 months & then once daily Patient taking differently: Take 40 mg by mouth See admin instructions. Take 40 mg by mouth two times a day for 2 months, then decrease to once a day 08/03/20 10/02/20  [name], MD  polyethylene glycol (MIRALAX / GLYCOLAX) 17 g packet Take 17 g by mouth daily as needed for moderate constipation. Patient taking differently: Take 17 g by mouth daily as needed for moderate constipation (MIX AND DRINK). 12/10/18   [name], MD  UNABLE TO FIND Prosthetic socket, from  [facility] 08/03/20   [name], MD  traZODone (DESYREL) 50 MG tablet Take 50 mg by mouth at bedtime as needed for sleep. Patient not taking: Reported on 10/01/2020 10/27/18   [provider]     Critical care time: 32 minutes    Redmond [employer]., MSN, APRN, AGACNP-BC [facility]  11/30/2020 , 5:21 PM  Please see [email or web address] for pager details  If no response, please call [phone] After hours, please call Elink at [phone]

## 2020-11-14 NOTE — Anesthesia Preprocedure Evaluation (Addendum)
Anesthesia Evaluation  Patient identified by MRN, date of birth, ID band Patient awake    Reviewed: Allergy & Precautions, NPO status , Patient's Chart, lab work & pertinent test results, reviewed documented beta blocker date and time   Airway Mallampati: III  TM Distance: >3 FB Neck ROM: Full    Dental  (+) Edentulous Lower, Edentulous Upper   Pulmonary Current Smoker and Patient abstained from smoking.,    Pulmonary exam normal        Cardiovascular hypertension, Pt. on home beta blockers + CAD and +CHF  Normal cardiovascular exam+ dysrhythmias      Neuro/Psych PSYCHIATRIC DISORDERS Anxiety Depression  Neuromuscular disease    GI/Hepatic GERD  Controlled,(+) Hepatitis -, C  Endo/Other  diabetes, Type 2, Insulin Dependent  Renal/GU Dialysis and ESRFRenal disease     Musculoskeletal  (+) Arthritis , Abscess Left AKA   Abdominal   Peds  Hematology negative hematology ROS (+)   Anesthesia Other Findings Day of surgery medications reviewed with the patient.  Reproductive/Obstetrics                            Anesthesia Physical Anesthesia Plan  ASA: III  Anesthesia Plan: General   Post-op Pain Management:    Induction: Intravenous  PONV Risk Score and Plan: 3 and Treatment may vary due to age or medical condition, Ondansetron and Dexamethasone  Airway Management Planned: LMA  Additional Equipment: None  Intra-op Plan:   Post-operative Plan: Extubation in OR  Informed Consent: I have reviewed the patients History and Physical, chart, labs and discussed the procedure including the risks, benefits and alternatives for the proposed anesthesia with the patient or authorized representative who has indicated his/her understanding and acceptance.     Dental advisory given  Plan Discussed with: CRNA and Anesthesiologist  Anesthesia Plan Comments:         Anesthesia Quick  Evaluation

## 2020-11-14 NOTE — Progress Notes (Signed)
Unsure of what is missing from order to complete it, filled out everything I could!

## 2020-11-14 NOTE — ED Notes (Signed)
Attempted to give reportx1 

## 2020-11-14 NOTE — Progress Notes (Signed)
[facility] [facility] Progress Note Patient Name: Brandy Houston DOB: 1955/10/03 MRN: [id number]   Date of Service  12/10/2020  HPI/Events of Note  Patient with septic shock secondary to an infected AKA stump, she was admitted to the ICU post-op debridement of the infected wound, she was anemic and hypoglycemic post-op and received PRBC transfusion and D50. She has a history of ESRD.  eICU Interventions  New Patient Evaluation completed.        [name] [name] 12/12/2020, 8:58 PM

## 2020-11-14 DEATH — deceased

## 2020-11-15 ENCOUNTER — Encounter (HOSPITAL_COMMUNITY): Payer: Self-pay | Admitting: Orthopedic Surgery

## 2020-11-15 DIAGNOSIS — L02416 Cutaneous abscess of left lower limb: Secondary | ICD-10-CM | POA: Diagnosis not present

## 2020-11-15 DIAGNOSIS — J9601 Acute respiratory failure with hypoxia: Secondary | ICD-10-CM | POA: Diagnosis not present

## 2020-11-15 DIAGNOSIS — A419 Sepsis, unspecified organism: Secondary | ICD-10-CM | POA: Diagnosis not present

## 2020-11-15 DIAGNOSIS — Z7189 Other specified counseling: Secondary | ICD-10-CM

## 2020-11-15 DIAGNOSIS — G9341 Metabolic encephalopathy: Secondary | ICD-10-CM | POA: Diagnosis not present

## 2020-11-15 DIAGNOSIS — Z515 Encounter for palliative care: Secondary | ICD-10-CM

## 2020-11-15 LAB — POCT I-STAT 7, (LYTES, BLD GAS, ICA,H+H)
Acid-base deficit: 11 mmol/L — ABNORMAL HIGH (ref 0.0–2.0)
Acid-base deficit: 3 mmol/L — ABNORMAL HIGH (ref 0.0–2.0)
Bicarbonate: 16.2 mmol/L — ABNORMAL LOW (ref 20.0–28.0)
Bicarbonate: 21.9 mmol/L (ref 20.0–28.0)
Calcium, Ion: 0.87 mmol/L — CL (ref 1.15–1.40)
Calcium, Ion: 0.97 mmol/L — ABNORMAL LOW (ref 1.15–1.40)
HCT: 26 % — ABNORMAL LOW (ref 36.0–46.0)
HCT: 32 % — ABNORMAL LOW (ref 36.0–46.0)
Hemoglobin: 10.9 g/dL — ABNORMAL LOW (ref 12.0–15.0)
Hemoglobin: 8.8 g/dL — ABNORMAL LOW (ref 12.0–15.0)
O2 Saturation: 92 %
O2 Saturation: 98 %
Patient temperature: 93.1
Potassium: 4.2 mmol/L (ref 3.5–5.1)
Potassium: 4.7 mmol/L (ref 3.5–5.1)
Sodium: 133 mmol/L — ABNORMAL LOW (ref 135–145)
Sodium: 133 mmol/L — ABNORMAL LOW (ref 135–145)
TCO2: 17 mmol/L — ABNORMAL LOW (ref 22–32)
TCO2: 23 mmol/L (ref 22–32)
pCO2 arterial: 35.3 mmHg (ref 32.0–48.0)
pCO2 arterial: 39.4 mmHg (ref 32.0–48.0)
pH, Arterial: 7.254 — ABNORMAL LOW (ref 7.350–7.450)
pH, Arterial: 7.353 (ref 7.350–7.450)
pO2, Arterial: 103 mmHg (ref 83.0–108.0)
pO2, Arterial: 62 mmHg — ABNORMAL LOW (ref 83.0–108.0)

## 2020-11-15 LAB — BASIC METABOLIC PANEL
Anion gap: 15 (ref 5–15)
BUN: 60 mg/dL — ABNORMAL HIGH (ref 8–23)
CO2: 21 mmol/L — ABNORMAL LOW (ref 22–32)
Calcium: 6.5 mg/dL — ABNORMAL LOW (ref 8.9–10.3)
Chloride: 94 mmol/L — ABNORMAL LOW (ref 98–111)
Creatinine, Ser: 5.92 mg/dL — ABNORMAL HIGH (ref 0.44–1.00)
GFR, Estimated: 7 mL/min — ABNORMAL LOW (ref >60)
Glucose, Bld: 118 mg/dL — ABNORMAL HIGH (ref 70–99)
Potassium: 5.8 mmol/L — ABNORMAL HIGH (ref 3.5–5.1)
Sodium: 130 mmol/L — ABNORMAL LOW (ref 135–145)

## 2020-11-15 LAB — GLUCOSE, CAPILLARY
Glucose-Capillary: 110 mg/dL — ABNORMAL HIGH (ref 70–99)
Glucose-Capillary: 171 mg/dL — ABNORMAL HIGH (ref 70–99)
Glucose-Capillary: 178 mg/dL — ABNORMAL HIGH (ref 70–99)
Glucose-Capillary: 31 mg/dL — CL (ref 70–99)
Glucose-Capillary: 61 mg/dL — ABNORMAL LOW (ref 70–99)
Glucose-Capillary: 82 mg/dL (ref 70–99)
Glucose-Capillary: 93 mg/dL (ref 70–99)
Glucose-Capillary: <10 mg/dL — CL (ref 70–99)
Glucose-Capillary: <10 mg/dL — CL (ref 70–99)

## 2020-11-15 LAB — CBC
HCT: 33.7 % — ABNORMAL LOW (ref 36.0–46.0)
Hemoglobin: 10.8 g/dL — ABNORMAL LOW (ref 12.0–15.0)
MCH: 27.9 pg (ref 26.0–34.0)
MCHC: 32 g/dL (ref 30.0–36.0)
MCV: 87.1 fL (ref 80.0–100.0)
Platelets: 114 10*3/uL — ABNORMAL LOW (ref 150–400)
RBC: 3.87 MIL/uL (ref 3.87–5.11)
RDW: 18.6 % — ABNORMAL HIGH (ref 11.5–15.5)
WBC: 8 10*3/uL (ref 4.0–10.5)
nRBC: 2.4 % — ABNORMAL HIGH (ref 0.0–0.2)

## 2020-11-15 LAB — RENAL FUNCTION PANEL
Albumin: 1.3 g/dL — ABNORMAL LOW (ref 3.5–5.0)
Anion gap: 16 — ABNORMAL HIGH (ref 5–15)
BUN: 57 mg/dL — ABNORMAL HIGH (ref 8–23)
CO2: 20 mmol/L — ABNORMAL LOW (ref 22–32)
Calcium: 6.5 mg/dL — ABNORMAL LOW (ref 8.9–10.3)
Chloride: 96 mmol/L — ABNORMAL LOW (ref 98–111)
Creatinine, Ser: 5.66 mg/dL — ABNORMAL HIGH (ref 0.44–1.00)
GFR, Estimated: 8 mL/min — ABNORMAL LOW (ref >60)
Glucose, Bld: 108 mg/dL — ABNORMAL HIGH (ref 70–99)
Phosphorus: 8.5 mg/dL — ABNORMAL HIGH (ref 2.5–4.6)
Potassium: 5.5 mmol/L — ABNORMAL HIGH (ref 3.5–5.1)
Sodium: 132 mmol/L — ABNORMAL LOW (ref 135–145)

## 2020-11-15 LAB — HEPATITIS B SURFACE ANTIGEN: Hepatitis B Surface Ag: NONREACTIVE

## 2020-11-15 LAB — MRSA PCR SCREENING: MRSA by PCR: NEGATIVE

## 2020-11-15 LAB — HEPATITIS B SURFACE ANTIBODY,QUALITATIVE: Hep B S Ab: NONREACTIVE

## 2020-11-15 LAB — LACTIC ACID, PLASMA: Lactic Acid, Venous: 5.7 mmol/L (ref 0.5–1.9)

## 2020-11-15 MED ORDER — KETAMINE HCL 50 MG/5ML IJ SOSY
PREFILLED_SYRINGE | INTRAMUSCULAR | Status: AC
  Filled 2020-11-15: qty 5

## 2020-11-15 MED ORDER — VASOPRESSIN 20 UNITS/100 ML INFUSION FOR SHOCK
0.0400 [IU]/min | INTRAVENOUS | Status: DC
  Administered 2020-11-15 – 2020-11-16 (×2): 0.03 [IU]/min via INTRAVENOUS
  Administered 2020-11-16 – 2020-11-17 (×2): 0.04 [IU]/min via INTRAVENOUS
  Filled 2020-11-15 (×3): qty 100

## 2020-11-15 MED ORDER — HEPARIN SODIUM (PORCINE) 5000 UNIT/ML IJ SOLN: 5000.0000 [IU] | Freq: Three times a day (TID) | INTRAMUSCULAR | Status: DC

## 2020-11-15 MED ORDER — DEXTROSE 50 % IV SOLN
INTRAVENOUS | Status: AC
  Filled 2020-11-15: qty 50

## 2020-11-15 MED ORDER — NOREPINEPHRINE 16 MG/250ML-% IV SOLN
2.0000 ug/min | INTRAVENOUS | Status: DC
  Administered 2020-11-15: 16 ug/min via INTRAVENOUS
  Filled 2020-11-15: qty 250

## 2020-11-15 MED ORDER — ETOMIDATE 2 MG/ML IV SOLN
INTRAVENOUS | Status: AC
  Filled 2020-11-15: qty 20

## 2020-11-15 MED ORDER — PRISMASOL BGK 4/2.5 32-4-2.5 MEQ/L REPLACEMENT SOLN
Status: DC
  Filled 2020-11-15 (×3): qty 5000

## 2020-11-15 MED ORDER — RENA-VITE PO TABS
1.0000 | ORAL_TABLET | Freq: Every day | ORAL | Status: DC
  Administered 2020-11-16: 1
  Filled 2020-11-15: qty 1

## 2020-11-15 MED ORDER — OXYCODONE HCL 5 MG PO TABS
5.0000 mg | ORAL_TABLET | ORAL | Status: DC | PRN
  Administered 2020-11-16: 5 mg via ORAL
  Filled 2020-11-15: qty 1

## 2020-11-15 MED ORDER — ROCURONIUM BROMIDE 10 MG/ML (PF) SYRINGE
PREFILLED_SYRINGE | INTRAVENOUS | Status: AC
  Filled 2020-11-15: qty 10

## 2020-11-15 MED ORDER — HEPARIN SODIUM (PORCINE) 1000 UNIT/ML DIALYSIS
1000.0000 [IU] | INTRAMUSCULAR | Status: DC | PRN
  Filled 2020-11-15: qty 3

## 2020-11-15 MED ORDER — JUVEN PO PACK: 1.0000 | PACK | Freq: Two times a day (BID) | ORAL | Status: DC

## 2020-11-15 MED ORDER — VANCOMYCIN HCL 750 MG/150ML IV SOLN
750.0000 mg | INTRAVENOUS | Status: DC
  Administered 2020-11-16: 750 mg via INTRAVENOUS
  Filled 2020-11-15: qty 150

## 2020-11-15 MED ORDER — HEPARIN BOLUS VIA INFUSION (CRRT)
1000.0000 [IU] | Freq: Once | INTRAVENOUS | Status: AC
  Administered 2020-11-15: 1000 [IU] via INTRAVENOUS_CENTRAL
  Filled 2020-11-15: qty 1000

## 2020-11-15 MED ORDER — PRISMASOL BGK 4/2.5 32-4-2.5 MEQ/L REPLACEMENT SOLN
Status: DC
  Filled 2020-11-15 (×5): qty 5000

## 2020-11-15 MED ORDER — NOREPINEPHRINE 16 MG/250ML-% IV SOLN
0.0000 ug/min | INTRAVENOUS | Status: DC
  Administered 2020-11-15: 11 ug/min via INTRAVENOUS
  Administered 2020-11-16 (×2): 40 ug/min via INTRAVENOUS
  Administered 2020-11-17: 100 ug/min via INTRAVENOUS
  Filled 2020-11-15 (×3): qty 250

## 2020-11-15 MED ORDER — HYDROMORPHONE HCL 1 MG/ML IJ SOLN
0.5000 mg | INTRAMUSCULAR | Status: DC | PRN
  Administered 2020-11-15: 0.5 mg via INTRAVENOUS
  Filled 2020-11-15: qty 0.5

## 2020-11-15 MED ORDER — NOREPINEPHRINE 4 MG/250ML-% IV SOLN: 0.0000 ug/min | INTRAVENOUS | Status: DC

## 2020-11-15 MED ORDER — MIDAZOLAM HCL 2 MG/2ML IJ SOLN
INTRAMUSCULAR | Status: AC
  Filled 2020-11-15: qty 2

## 2020-11-15 MED ORDER — SODIUM CHLORIDE 0.9 % IV SOLN
300.0000 [IU]/h | INTRAVENOUS | Status: DC
  Administered 2020-11-15: 300 [IU]/h via INTRAVENOUS_CENTRAL
  Filled 2020-11-15: qty 2

## 2020-11-15 MED ORDER — HEPARIN SODIUM (PORCINE) 1000 UNIT/ML IJ SOLN
3000.0000 [IU] | Freq: Once | INTRAMUSCULAR | Status: AC
  Administered 2020-11-15: 3000 [IU] via INTRAVENOUS
  Filled 2020-11-15: qty 3

## 2020-11-15 MED ORDER — FENTANYL CITRATE (PF) 100 MCG/2ML IJ SOLN
INTRAMUSCULAR | Status: AC
  Filled 2020-11-15: qty 2

## 2020-11-15 MED ORDER — DOCUSATE SODIUM 100 MG PO CAPS: 100.0000 mg | ORAL_CAPSULE | Freq: Every day | ORAL | Status: DC

## 2020-11-15 MED ORDER — CHLORHEXIDINE GLUCONATE 0.12 % MT SOLN
15.0000 mL | Freq: Two times a day (BID) | OROMUCOSAL | Status: DC
  Administered 2020-11-15 – 2020-11-16 (×3): 15 mL via OROMUCOSAL
  Filled 2020-11-15: qty 15

## 2020-11-15 MED ORDER — SODIUM CHLORIDE 0.9 % IV SOLN: INTRAVENOUS | Status: DC

## 2020-11-15 MED ORDER — VANCOMYCIN VARIABLE DOSE PER UNSTABLE RENAL FUNCTION (PHARMACIST DOSING): Status: DC

## 2020-11-15 MED ORDER — PHENOL 1.4 % MT LIQD
1.0000 | OROMUCOSAL | Status: DC | PRN
Start: 2020-11-15 — End: 2020-11-17

## 2020-11-15 MED ORDER — ORAL CARE MOUTH RINSE
15.0000 mL | Freq: Two times a day (BID) | OROMUCOSAL | Status: DC
  Administered 2020-11-15 – 2020-11-16 (×3): 15 mL via OROMUCOSAL

## 2020-11-15 MED ORDER — SODIUM CHLORIDE 0.9 % FOR CRRT: INTRAVENOUS_CENTRAL | Status: DC | PRN

## 2020-11-15 MED ORDER — SODIUM CHLORIDE 0.9 % IV SOLN
2.0000 g | Freq: Two times a day (BID) | INTRAVENOUS | Status: DC
  Administered 2020-11-15 – 2020-11-16 (×2): 2 g via INTRAVENOUS
  Filled 2020-11-15 (×2): qty 2

## 2020-11-15 MED ORDER — ALTEPLASE 2 MG IJ SOLR
2.0000 mg | Freq: Once | INTRAMUSCULAR | Status: DC | PRN
  Filled 2020-11-15: qty 2

## 2020-11-15 MED ORDER — PRISMASOL BGK 4/2.5 32-4-2.5 MEQ/L EC SOLN
Status: DC
  Filled 2020-11-15 (×21): qty 5000

## 2020-11-15 NOTE — Anesthesia Postprocedure Evaluation (Signed)
Anesthesia Post Note  Patient: Brandy Houston  Procedure(s) Performed: AMPUTATION ABOVE KNEE (Left Knee) APPLICATION OF WOUND VAC (Left Leg Upper)     Patient location during evaluation: PACU Anesthesia Type: General Level of consciousness: lethargic Pain management: pain level controlled Vital Signs Assessment: post-procedure vital signs reviewed and stable Respiratory status: spontaneous breathing, nonlabored ventilation, respiratory function stable and patient connected to nasal cannula oxygen Cardiovascular status: blood pressure returned to baseline, stable and unstable Postop Assessment: no apparent nausea or vomiting Anesthetic complications: no Comments: Hypoglycemic in PACU requiring 2 amps of D50.  Spoke with PACU nurse and surgeon, and agreed to escalate care to ICU.  CCM consulted and rounded on patient at bedside in PACU.  Patient requiring phenylephrine infusion to maintain blood pressure at pre-op baseline.  Patient somnolent but protecting airway.  Report given to CCM MD who agreed to accept care of patient.   No complications documented.  Last Vitals:  Vitals:   11/15/20 1845 11/15/20 1927  BP: (!) 94/57   Pulse:    Resp: (!) 27   Temp:  (!) 33.9 C  SpO2:      Last Pain:  Vitals:   11/15/20 1927  TempSrc: Axillary  PainSc:                  Catalina Gravel

## 2020-11-15 NOTE — Progress Notes (Signed)
Pharmacy Antibiotic Note  Brandy Houston is a 66 y.o. female admitted on 11/10/2020 with cellulitis.  Pharmacy has been consulted for vancomycin and cefepime dosing. Patient is s/p left AKA by Dr. Sharol Given and follows with wound care outpatient, last visit on 5/27 with plan for silvadene and dressing change. Patient is also ESRD with HD TThS. Today patient presents with difficulty swallowing. Patient missed HD today due to presenting to the ED. Most recent WBC 7.9 and LA 2.6. No current plans for HD today per notes. Will continue to monitor for intermittent HD sessions for vancomycin doses.   Pt is s/p AKA. Plan was for to continue abx for 24 hrs post procedure but patient developed shock. Vanc/cefepime/flagyl ordered empirically. She is being started on CRRT this afternoon so we will adjust abx for that.   Plan: Vancomycin 750mg  IV q24 Cefepime 2g IV q12 F/u LOT  Height: 5\' 7"  (170.2 cm) Weight: 60.4 kg (133 lb 2.5 oz) IBW/kg (Calculated) : 61.6  Temp (24hrs), Avg:96.4 F (35.8 C), Min:92.3 F (33.5 C), Max:97.4 F (36.3 C)  Recent Labs  Lab 11/08/20 1409 11/01/2020 1104 11/09/2020 1230 10/15/2020 1603 11/03/2020 2035 11/18/2020 0217 12/11/2020 2056 11/15/20 0427  WBC 10.1 7.9  --   --   --  7.1 7.5 8.0  CREATININE 5.97* 6.44*  --   --   --  6.29* 6.27* 5.92*  LATICACIDVEN  --   --  2.6* 2.1* 2.0*  --  4.0*  --     Estimated Creatinine Clearance: 8.9 mL/min (A) (by C-G formula based on SCr of 5.92 mg/dL (H)).    Allergies  Allergen Reactions  . Compazine [Prochlorperazine] Shortness Of Breath, Swelling and Other (See Comments)    TONGUE SWELLS  . Shellfish-Derived Products Anaphylaxis  . Iodinated Diagnostic Agents Hives and Rash  . Omnipaque [Iohexol] Hives  . Tape Other (See Comments)    Owens Shark, paper tape caused skin irritation  . Sulfa Antibiotics Rash    Antimicrobials this admission: 5/31 Vanc >> 5/31 Cefepime >> 5/31 flagyl>> 5/31 CTX x1  Dose adjustments this  admission:  Microbiology results: 5/31 BCx: ngtd 6/2 urine>>   Onnie Boer, PharmD, BCIDP, AAHIVP, CPP Infectious Disease Pharmacist 11/15/2020 1:26 PM

## 2020-11-15 NOTE — Progress Notes (Signed)
Inpatient Rehabilitation Admissions Coordinator  Inpatient rehab consult received. I await therapy evals and then will follow up for rehab assessment and venue options.  Danne Baxter, RN, MSN Rehab Admissions Coordinator [phone] 11/15/2020 1:27 PM

## 2020-11-15 NOTE — Progress Notes (Signed)
eLink Physician-Brief Progress Note Patient Name: Brandy Houston DOB: Sep 05, 1955 MRN: [id number]   Date of Service  11/15/2020  HPI/Events of Note  Patient more drowsy than earlier in the evening when her daughter visited, her saturation also dipped, but improved with a non re-breather mask, patient got 0.5 mg of Dilaudid earlier in the evening.  eICU Interventions  Stat ABG ordered.        [name] Destyn Schuyler 11/15/2020, 9:07 PM

## 2020-11-15 NOTE — Progress Notes (Signed)
Patient is postop day 1 status post above-knee amputation revision.   Patient is resting.  Her VAC has to xx but is functioning 0 cc in the canister

## 2020-11-15 NOTE — Progress Notes (Signed)
Treatment ended after 23 minutes. Pt o2 desat and hypotensive. Pt unresponsive. MD Shertz notified - pt likely to transition to CRRT d/t inability to tolerate HD.

## 2020-11-15 NOTE — Progress Notes (Signed)
[facility] Progress Note Patient Name: Brandy Houston DOB: Jun 20, 1955 MRN: [id number]   Date of Service  11/15/2020  HPI/Events of Note  Patient with hypoglycemia, hypotension and a drop in saturation, blood sugar went from 171 mg / dl  At 7 pm to < 10  Mg / dl at 11 pm.     SBP was in the 60's.                          eICU Interventions  D 50 % water 1 amp pushed iv and D 10 % water gtt started at 75 ml / hour, CBG will be checked hourly x 12, Vasopressin added to Norepinephrine and a 250 ml iv bolus of 5 % Albumin ordered, PCCM ground crew requested to evaluate the patient as they will likely require central line and arterial line, in addition to ongoing resuscitative efforts.        [name] Alben Jepsen 11/15/2020, 11:30 PM

## 2020-11-15 NOTE — Progress Notes (Signed)
PCCM note  Called to the bedside as patient became unresponsive, hypotensive when hemodialysis was initiated ABG looks okay.  Hemodialysis stopped, Levophed increased with improvement in blood pressure She is more awake but falling asleep Protecting airway for now but still tenuous.  Will monitor closely  Discussed with nephrology.  We will switch HD to CRRT  Discussed with son at bedside and daughter over telephone.  Reviewed code status and recommended DNR due to his significant comorbid illness, frailty, malnutrition.  They want to discuss with family first Palliative care meeting scheduled for tomorrow morning.  The patient is critically ill with multiple organ system failure and requires high complexity decision making for assessment and support, frequent evaluation and titration of therapies, advanced monitoring, review of radiographic studies and interpretation of complex data.   Critical Care Time devoted to patient care services, exclusive of separately billable procedures, described in this note is 35 minutes.   Marshell Garfinkel MD [facility] See Amion for pager  If no response to pager , please call [phone] until 7pm After 7:00 pm call Elink  [phone] 11/15/2020, 1:46 PM

## 2020-11-15 NOTE — Progress Notes (Addendum)
Multiple clotting filters and issues regarding access pressures likely 2/2 clotting. Access appears to be fine.  Start circuit heparin weight based 300 units/hr (5 units/kg/hr) w/ 1000 unit initiation bolus. Can adjust dose if clotting persists.

## 2020-11-15 NOTE — Progress Notes (Signed)
Hypoglycemic Event  CBG: 31  Treatment: D50 50 mL (25 gm)  Symptoms: Nervous/irritable  Follow-up CBG: Time:  CBG Result: 171  Possible Reasons for Event: Inadequate meal intake  Comments/MD notified: elink    Mardi Mainland

## 2020-11-15 NOTE — Consult Note (Signed)
Consultation Note Date: 11/15/2020   Patient Name: Brandy Houston  DOB: 1955/09/28  MRN: [id number]  Age / Sex: 65 y.o., female  PCP: [name], MD Referring Physician: Candee Furbish, MD  Reason for Consultation: Establishing goals of care  HPI/Patient Profile: 65 y.o. female  with past medical history of ESRD on HD, L AKA, DM, recurrent GI bleeding with AVMs, PVD, HTN, Chronic diastolic heart failure (last ECHO 05/09/2020 showed EF 40-45%, globalhypokinesis of L ventricle pericardial effusion, tricuspid and aortic valve regurgitation, with moderate pericardial effusion- noted this was improved from previous 10/21 ECHO) s/p admitted on 11/12/2020 with septic shock related to infected stump wound. Underwent revision of AKA. Developed hypotension with anesthesia and started on pressors. Now with pulmonary edema- volume overload- has missed HD appointments prior to admission. Palliative medicine consulted for goals of care.   Clinical Assessment and Goals of Care:  I spoke by phone with patient's daughter [name]- and then with patient's son- Alverda Skeans.   Evaluated patient- she was lying in bed, answered hello- no other questions. With NRB mask, breathing appeared labored. Undergoing bedside dialysis.   [name] continues to be frustrated with communication yesterday. Attempted to refocus [name] on the need to discuss goals of care, code status, advanced care planning. At first [place] stated, "we aren't going to put her on a ventilator and she has a DNR in place". Noted patient is full code to [facility] and questioned if DNR needs to be ordered. [name] did not want DNR ordered.   [name] was confused as to why palliative discussion needed to take place- she states they have "been through this" before with their Mom and as far as she knows her Mom is getting better. I discussed the hope that she will continue to improve-  however, it is necessary while hoping for the best to prepare for the worst. [name] agreed, stated that all siblings should be involved and she wasn't going to make any decisions. I offered phone conference call and [facility], recommended that I arrange the conference call through her brother 33.   I spoke with patient's son Alverda Skeans. He also shares feelings that the family has been down this path with their Mom many times- however, he is certain that the surgery will lead to her improvement.  I discussed with Shaun the necessity of advanced care planning and discussion his Mom's wishes regarding code status and life support. Again reiterating that we continue to hope for the best but plan for the worst. We also discussed the fact of her overall ongoing decline and the implications of critical illness in a debilitated patient. For now Shaun wishes for full code status and continued aggressive life prolonging care- but is agreeable to Palliative discussion with all siblings involved tomorrow.   I clarified with [name] and Alverda Skeans and they agree that Alverda Skeans is the primary person to contact regarding medical decision making for [place].    Primary Decision Maker NEXT OF KIN- Patient's children    SUMMARY OF RECOMMENDATIONS -Continue  full code, full scope -Hopeful for further discussions tomorrow with all of patient's children present via conference call  -If patient's respiratory status deteriorates overnight- recommend discussing with family before placing on ventilator    Code Status/Advance Care Planning:  Full code  Prognosis:    Unable to determine  Discharge Planning: To Be Determined  Primary Diagnoses: Present on Admission: . Amputation stump infection ([place]) . Dyslipidemia . Essential hypertension   I have reviewed the medical record, interviewed the patient and family, and examined the patient. The following aspects are pertinent.  Past Medical History:  Diagnosis  Date  . Allergy   . Anemia   . Anxiety   . Arthritis    "in my joints" (03/10/2017)  . Chronic diastolic CHF (congestive heart failure) ([place])   . Depression    Chronic  . Diabetic peripheral neuropathy ([place])   . Dysrhythmia    tachycardia, normal ECHO 08-09-14  . ESRD (end stage renal disease) ([place])   . GERD (gastroesophageal reflux disease)   . Heart murmur     dx'd 02/05/2017  . Hepatitis C    "tx'd in 2016; I'm negative now" (02/05/2017)  . History of blood transfusion 2017   "w/knee replacement"  . Hyperlipidemia   . Hypertension   . Peripheral neuropathy   . Protein calorie malnutrition ([place])   . Septic arthritis ([place])   . Slow transit constipation   . Symptomatic anemia 01/27/2020  . Type II diabetes mellitus ([name])    IDDM  . Uncontrolled hypertension 02/18/2013   Social History   Socioeconomic History  . Marital status: Single    Spouse name: Not on file  . Number of children: 3  . Years of education: Not on file  . Highest education level: Not on file  Occupational History  . Occupation: disabled  Tobacco Use  . Smoking status: Current Every Day Smoker    Packs/day: 0.50    Years: 45.00    Pack years: 22.50    Types: Cigarettes  . Smokeless tobacco: Never Used  Vaping Use  . Vaping Use: Never used  Substance and Sexual Activity  . Alcohol use: Not Currently    Alcohol/week: 0.0 standard drinks    Comment: 03/10/2017 "I drink a wine cooler a few times/year"  . Drug use: No  . Sexual activity: Not Currently  Other Topics Concern  . Not on file  Social History Narrative   Right handed   Social Determinants of Health   Financial Resource Strain: Not on file  Food Insecurity: Not on file  Transportation Needs: Not on file  Physical Activity: Not on file  Stress: Not on file  Social Connections: Not on file   Scheduled Meds: . atorvastatin  10 mg Oral QHS  . calcitRIOL  2 mcg Oral Q T,Th,Sa-HD  . calcium acetate  1,334 mg Oral TID WC   And  .  calcium acetate  667 mg Oral With snacks  . chlorhexidine  15 mL Mouth Rinse BID  . Chlorhexidine Gluconate Cloth  6 each Topical Q0600  . docusate sodium  100 mg Oral Daily  . ferrous sulfate  325 mg Oral Daily  . [START ON 12-12-2020] heparin  5,000 Units Subcutaneous Q8H  . hydrocortisone sod succinate (SOLU-CORTEF) inj  100 mg Intravenous Q8H  . insulin aspart  0-6 Units Subcutaneous TID WC  . mouth rinse  15 mL Mouth Rinse q12n4p  . midodrine  10 mg Oral TID WC  . misoprostol  100  mcg Oral Q6H  . [START ON 11/16/2020] multivitamin  1 tablet Per Tube QHS  . nortriptyline  30 mg Oral QHS  . nutrition supplement (JUVEN)  1 packet Oral BID BM  . pantoprazole  40 mg Oral Daily  . sodium chloride flush  3 mL Intravenous Q12H  . vancomycin variable dose per unstable renal function (pharmacist dosing)   Does not apply See admin instructions   Continuous Infusions: . sodium chloride 10 mL/hr at 11/15/20 0800  . sodium chloride    . sodium chloride    . sodium chloride    . ceFEPime (MAXIPIME) IV Stopped (10/21/2020 1959)  . dextrose    . metronidazole 100 mL/hr at 11/15/20 0800  . norepinephrine (LEVOPHED) Adult infusion 2 mcg/min (11/15/20 0900)   PRN Meds:.acetaminophen **OR** acetaminophen, calcium carbonate (dosed in mg elemental calcium), camphor-menthol **AND** hydrOXYzine, docusate sodium, HYDROmorphone (DILAUDID) injection, morphine injection, ondansetron **OR** ondansetron (ZOFRAN) IV, oxyCODONE, phenol, polyethylene glycol, sorbitol, zolpidem Medications Prior to Admission:  Prior to Admission medications   Medication Sig Start Date End Date Taking? Authorizing Provider  atorvastatin (LIPITOR) 10 MG tablet Take 1 tablet (10 mg total) by mouth at bedtime. 06/13/20  Yes [name], MD  calcium acetate (PHOSLO) 667 MG capsule Take [phone] mg by mouth See admin instructions. Take 1,334 mg by mouth three times a day with meals and 667 mg twice a day with each snack 01/13/19  Yes  [provider]  diphenhydrAMINE (BENADRYL) 25 mg capsule Take 25 mg by mouth every 6 (six) hours as needed for allergies.   Yes [provider]  diphenhydramine-acetaminophen (TYLENOL PM) 25-500 MG TABS tablet Take 1 tablet by mouth at bedtime.   Yes [provider]  doxycycline (VIBRAMYCIN) 100 MG capsule Take 1 capsule (100 mg total) by mouth 2 (two) times daily. 11/08/20  Yes Pattricia Boss, MD  Ensure (ENSURE) Take 237 mLs by mouth daily with breakfast.   Yes [provider]  hydrALAZINE (APRESOLINE) 100 MG tablet Take 100 mg by mouth in the morning and at bedtime.   Yes [provider]  isosorbide mononitrate (IMDUR) 30 MG 24 hr tablet Take 1 tablet (30 mg total) by mouth daily. 07/16/20 10/14/20 Yes Donato Heinz, MD  lidocaine-prilocaine (EMLA) cream Apply 1 application topically Every Tuesday,Thursday,and Saturday with dialysis. 10/25/18  Yes [provider]  metoprolol succinate (TOPROL XL) 25 MG 24 hr tablet Take 1 tablet (25 mg total) by mouth daily. 10/31/20  Yes Donato Heinz, MD  misoprostol (CYTOTEC) 100 MCG tablet TAKE 1 TABLET BY MOUTH EVERY 6 HOURS Patient taking differently: Take 100 mcg by mouth every 6 (six) hours. 10/29/20  Yes Fayrene Helper, MD  multivitamin (RENA-VIT) TABS tablet Take 1 tablet by mouth in the morning. 03/01/19  Yes [provider]  nitroGLYCERIN (NITROSTAT) 0.4 MG SL tablet Place 1 tablet (0.4 mg total) under the tongue every 5 (five) minutes as needed. Patient taking differently: Place 0.4 mg under the tongue every 5 (five) minutes as needed for chest pain. 04/25/20 07/24/20 Yes Donato Heinz, MD  nortriptyline (PAMELOR) 10 MG capsule Take 3 capsules (30 mg total) by mouth at bedtime. 10/29/20  Yes Pieter Partridge, DO  Accu-Chek Softclix Lancets lancets Check sugars three times a day for E11.65 09/20/18   [name], MD  acetaminophen (TYLENOL) 500 MG tablet Take 1,000 mg  by mouth every 6 (six) hours as needed for mild pain.    [provider]  blood glucose meter kit and supplies KIT Dispense based on patient and insurance preference. Use up to four times daily as directed 10/13/19   [name], MD  Blood Glucose Monitoring Suppl (ACCU-CHEK GUIDE) w/Device KIT 1 Device by Does not apply route daily. 10/31/19   [name], MD  diltiazem 2 % GEL Apply 1 application topically 3 (three) times daily. Apply to affected area on anus Patient taking differently: Apply 1 application topically 3 (three) times daily as needed (for irritation on/in the anus). 06/25/20   Samuella Cota, MD  glucose blood (ACCU-CHEK GUIDE) test strip Check blood sugar up to 4 times a day 10/20/19   [name], MD  HYDROcodone-acetaminophen (NORCO) 5-325 MG tablet Take 1 tablet by mouth every 6 (six) hours as needed for moderate pain. 10/31/20   [name], MD  hydrocortisone (ANUSOL-HC) 25 MG suppository Place 1 suppository (25 mg total) rectally 2 (two) times daily. Patient taking differently: Place 25 mg rectally 2 (two) times daily as needed for hemorrhoids or anal itching. 06/25/20   Samuella Cota, MD  Iron, Ferrous Sulfate, 325 (65 Fe) MG TABS Take 325 mg by mouth daily. Note that this medication will turn stool black and may cause constipation. 06/25/20   Samuella Cota, MD  Lancet Devices [facility]) lancets Check sugars TID for E11.65 01/29/15   Lance Bosch, NP  pantoprazole (PROTONIX) 40 MG tablet Take 1 tablet (40 mg total) by mouth daily. 40 mg BID for 2 months & then once daily Patient taking differently: Take 40 mg by mouth See admin instructions. Take 40 mg by mouth two times a day for 2 months, then decrease to once a day 08/03/20 10/02/20  [name], MD  polyethylene glycol (MIRALAX / GLYCOLAX) 17 g packet Take 17 g by mouth daily as needed for moderate constipation. Patient taking differently: Take 17 g by mouth daily as needed for  moderate constipation (MIX AND DRINK). 12/10/18   [name], MD  UNABLE TO FIND Prosthetic socket, from [facility] 08/03/20   [name], MD  traZODone (DESYREL) 50 MG tablet Take 50 mg by mouth at bedtime as needed for sleep. Patient not taking: Reported on 10/01/2020 10/27/18   [provider]   Allergies  Allergen Reactions  . Compazine [Prochlorperazine] Shortness Of Breath, Swelling and Other (See Comments)    TONGUE SWELLS  . Shellfish-Derived Products Anaphylaxis  . Iodinated Diagnostic Agents Hives and Rash  . Omnipaque [Iohexol] Hives  . Tape Other (See Comments)    Owens Shark, paper tape caused skin irritation  . Sulfa Antibiotics Rash   Review of Systems  Physical Exam Vitals and nursing note reviewed.  Constitutional:      General: She is in acute distress.     Appearance: She is ill-appearing.     Comments: frail  Pulmonary:     Comments: Increased effort, rate Neurological:     Comments: Patient nonverbal- unable to assess orientation     Vital Signs: BP 91/60   Pulse 74   Temp (!) 92.3 F (33.5 C) (Axillary)   Resp (!) 71   Ht _0  (1.702 m)   Wt 60.4 kg   LMP 03/16/2002   SpO2 100%   BMI 20.86 kg/m  Pain Scale: 0-10   Pain Score: Asleep   SpO2: SpO2: 100 % O2 Device:SpO2: 100 % O2 Flow Rate: .O2 Flow Rate (L/min): 6 L/min  IO: Intake/output summary:  Intake/Output Summary (Last 24 hours) at 11/15/2020 1228 Last data filed at 11/15/2020 1145 Gross per 24 hour  Intake 1894.32 ml  Output 50 ml  Net 1844.32 ml    LBM: Last BM Date: 11/12/20 Baseline Weight: Weight: 58.1 kg Most recent weight: Weight: 60.4 kg     Palliative Assessment/Data: PPS:   20%      Thank you for this consult. Palliative medicine will continue to follow and assist as needed.   Time In: 1208 Time Out: 1333 Time Total: 85 minutes Greater than 50%  of this time was spent counseling and coordinating care related to the above assessment and  plan.  Signed by: Mariana Kaufman, AGNP-C Palliative Medicine    Please contact Palliative Medicine Team phone at [phone] for questions and concerns.  For individual provider: See Shea Evans

## 2020-11-15 NOTE — Progress Notes (Signed)
Nutrition Follow-up  DOCUMENTATION CODES:   Severe malnutrition in context of chronic illness  INTERVENTION:   Plan for Cortrak placement tomorrow  Tube Feeding via Cortrak: Vital 1.5 at 50 ml/hr Begin at 20 ml/hr; titrate by 10 mL q 8 hours until goal rate of 50 ml/hr Pro-Source TF 45 mL TID Provides 114 g of protein, 1920 kcals and 912 mL of free water  Recommend checking Vitamin C, Zinc, Vitamin A as pt is at risk for deficiency and deficiency will impair wound healing. If deficient, recommend supplementing  Continue Renal MVI  May need to hold phosphorus binders while on CRRT; hold while NPO  NUTRITION DIAGNOSIS:   Severe Malnutrition related to chronic illness as evidenced by severe fat depletion,severe muscle depletion.  Being addressed via TF   GOAL:   Patient will meet greater than or equal to 90% of their needs  Not met but being addressed  MONITOR:   PO intake,Supplement acceptance,Labs,Weight trends,Skin,I & O's  REASON FOR ASSESSMENT:   Consult Wound healing  ASSESSMENT:   Brandy Houston is a 65 y.o. 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  She is very ill-appearing at this time and difficult to understand with quiet speech and the Bair hugger in place.  She reports chronic difficulty with her left stump and with infection there as well as recent progressive dysphagia.  Her son reports that they have been taking antibiotics for the stump infection and it hasn't been getting better faster enough for him.   6/1 Left AKA abscess-revision, application of wound VAC  Pt became unresponsive, worsening hypotension today Noted  plan for CRRT today given instability, ESRD on HD as outpatient and failed HD treatment today. Pt currently requiring pressors  Plan for Cortrak tomorrow with  initiation of TF  Outpatient EDW 50.5 kg. Current wt 60 kg  Noted sig edema on RLE post revision AKA  Labs: sodium 133 (L) Meds: calcitriol, PhosLo, ss novolog, Rena-Vite  NUTRITION - FOCUSED PHYSICAL EXAM:  Flowsheet Row Most Recent Value  Orbital Region Mild depletion  Upper Arm Region Severe depletion  Thoracic and Lumbar Region No depletion  Buccal Region Mild depletion  Temple Region Moderate depletion  Clavicle Bone Region Severe depletion  Clavicle and Acromion Bone Region Severe depletion  Scapular Bone Region Severe depletion  Dorsal Hand Mild depletion  Patellar Region Mild depletion  Anterior Thigh Region Mild depletion  Posterior Calf Region Mild depletion  Edema (RD Assessment) Mild  Hair Reviewed  Eyes Reviewed  Mouth Reviewed  Skin Reviewed  Nails Reviewed       Diet Order:   Diet Order            Diet NPO time specified  Diet effective now                 EDUCATION NEEDS:   Not appropriate for education at this time  Skin:  Skin Assessment: Skin Integrity Issues: Skin Integrity Issues:: Wound VAC Wound Vac: lt leg incision  Last BM:  11/12/20  Height:   Ht Readings from Last 1 Encounters:  12/06/2020 5' 7" (1.702 m)    Weight:   Wt Readings from Last 1 Encounters:  11/15/20 60.4 kg    Ideal Body Weight:  56.5 kg (adjusted for lt AKA)  BMI:  Body mass index is 20.86 kg/m.  Estimated Nutritional Needs:   Kcal:  1850-2050  Protein:  105-120 grams  Fluid:  1000 ml + UOP   Cate  MS, RDN, LDN, CNSC Registered Dietitian III Clinical Nutrition RD Pager and On-Call Pager Number Located in Amion    

## 2020-11-15 NOTE — Progress Notes (Signed)
Orthopedic Tech Progress Note Patient Details:  Brandy Houston November 16, 1955 [id number] Ordered brace [name], female   DOB: August 23, 1955, 65 y.o.   MRN: [id number]   Brandy Houston 11/15/2020, 2:30 PM

## 2020-11-15 NOTE — Consult Note (Signed)
NAME:  Brandy Houston, MRN:  [id number], DOB:  [birth year], LOS: 2 ADMISSION DATE:  11/11/2020, CONSULTATION DATE:  11/23/2020 REFERRING MD:  Trellis Moment MD, Reason for Consult:  Hypotension  History of Present Illness:  Per chart review, Brandy Houston  is a 65 y.o. F who presented to [facility] on 5/31 with concerns of left stump infection.   She has a past medical history of DM, PVD S/P AKA, ESRD on HD, DM, CAD, HTN, and recurrent GIB.  A sepsis workup was initated in the ED. Vanc, Cefepime, and Flagyl were started after [facility] were drawn. TRH admitted for evaluation. Per report she had missed previously on HD session. Orthopaedics were consulted and felt that Brandy Houston was an operative candidate.   She underwent a revision of the left AKA with Dr. Sharol Given on 6/1.  She was extubated and taken to the PACU. In the PACU she was found to be hypotensive, Hypoglycemic with a BG less than 10. 2 Amps of dextrose was administered with improvement to greater than 300. 2U PRBC were ordered and she was started on peripheral neo.  PCCM was consulted for assistance and transfer to ICU on 6/1.  She remains critically ill.    Pertinent  Medical History  DM, PVD S/P AKA in 2008, ESRD on HD, CAD, HTN,  recurrent GIB, Hep C   Significant Hospital Events: Including procedures, antibiotic start and stop dates in addition to other pertinent events   . 5/31 Admit. BC>  Vanc,. Cefepime, Zosyn >> . 6/1 OR for L AKA. Hypotensive 2U PRBC admin, hypoglycemic. PCCM consult. Transfer to ICU. HD cath placed  Interim History / Subjective:  OR for stump revision, Hypotensive and hypoglycemic post op, PCCM Consulted, HD cath placed overnight x2 U PRBC  Tmax 97.8  On 2 levophed  +2L past 24, +4.6L admission, no UOP  Subjective: Denies chest pain, sob, and pain. Objective   Blood pressure 101/67, pulse 73, temperature (!) 97.4 F (36.3 C), temperature source Axillary, resp. rate 15, height 5\' 7"  (1.702 m), weight 58.1 kg, last  menstrual period 03/16/2002, SpO2 94 %. on 4LNC        Intake/Output Summary (Last 24 hours) at 11/15/2020 0850 Last data filed at 12/06/2020 2030 Gross per 24 hour  Intake 1834.45 ml  Output 50 ml  Net 1784.45 ml   Filed Weights   11/29/2020 1303  Weight: 58.1 kg    Examination: General:  Ill appearing, in bed, interactive HEENT: MM pink/moist, anicteric, trachea midline  Neuro: MAE, confused, oriented to self and year, RASS 0, Pupil R>L reactive  CV: S1S2, NSR on monitor, no m/r/g appreciated PULM:  clear in the upper lobes and in the lower lobes, chest expansion symmetric GI: soft, bsx4 active, non tender Extremities: hands and feet cool, dry, no pretibial edema, capillary refill less than 3 seconds  Skin: L stump with WV, no output   Labs/imaging that I havepersonally reviewed  (right click and "Reselect all SmartList Selections" daily)  CMP- Hyponatremic NA 131>130, K 5.8, AST/ALT elevated slightly, creat 6.26>5.92 BUN 60 CBC> PLT 114, HGB 11.1>10.8 Resolved Hospital Problem list     Assessment & Plan:   Septic Shock vs Hypovolemic Shock S/P LLE AKA with concern for wound infection. Lactate 2.0 on 5/31. PCT 6.11. AG 17. Suspect multifactorial shock with possible infection of stump and uremic platelets contributing to bleeding. No evidence of ST changes on 12 lead Troponin high senitivity 31>36 post op. Suspect demand from shock. Lactate  2>4. Pressors weaned in past 12 hours. -Continue broad spectrum ABX Vanc/Cefepime/Flagyl. Narrow as cultures result. Follow up BC -Continue levophed. Goal map greater than 65. Titrate to goal -Continue stress dose stereoids 100mg  solucortef q8h  -Starting midodrine 10mg  q6h today -monitor for continued blood loss  LLE AKA Revision OR with Dr. Sharol Given on 6.1 -Management per orthopaedics -Monitor site for blood loss  Anemia of Chronic Illness vs Acute Blood Loss Anemia HBG appears to be around  7.5-9 at baseline. Hx of GI AVM's. 2 units  PRBC 6/1 Hgb 10.8 -Transfuse PRBC if HBG less than 7 -Obtain AM CBC to trend H&H  Acute Metabolic Encephalopathy-improving S/P surgery, hypotension, missed dialysis, and hypoglycemia. GCS improved compared to yesterday. -continue to monitor neuro exam. Hopefully will improve post dialysis.   Acute Respiratory Failure with Hypoxia-improving Pulmonary edema present on CXR . 100% NRB overnight> 4LNC -Goal SPO2 92-98% wean o2 as able -pulmonary hygiene, OOB when able, flutter valve  ESRD on TTS HD Was previously on HD, missed most recent session prior to admit -Management per neph -?CRRT vs HD -Vascath placed  Hypoglycemia BG of less than 10 documented intraop, received two amps of dextrose and improved to 352 Hx DM BG 108-163 post op -D10 gtt stopped -Continue sensitive SSI -Blood Glucose goal 140-180.  Hyponatremia NA 131>130 -No free water -Continue to monitor neurologic exam  CAD/PVD -Continue holding imdur in the setting of hypotension requiring vasopressors  Hx HTN -Continue to hold home antihypertensives  H/O GIB -Continue to monitor for signs of blood loss -FOBT when stooling  HLD -Continue lipitor  Mood disorder -Continue nortriptyline   Best practice (right click and "Reselect all SmartList Selections" daily)  Diet:  NPO Pain/Anxiety/Delirium protocol (if indicated): No VAP protocol (if indicated): Not indicated DVT prophylaxis: Subcutaneous Heparin and SCD GI prophylaxis: PPI Glucose control:  SSI Yes Central venous access:  Yes, and it is still needed Arterial line:  N/A Foley:  N/A Mobility:  bed rest  PT consulted: N/A Last date of multidisciplinary goals of care discussion: Pending Palliative Care discussion for 6/2 Code Status:  full code Disposition: ICU   Critical care time: 31 minutes    Redmond School., MSN, APRN, AGACNP-BC [facility]  11/15/2020 , 8:50 AM  Please see [email or web address] for pager  details  If no response, please call [phone] After hours, please call Elink at [phone]

## 2020-11-15 NOTE — Progress Notes (Signed)
[facility] Progress Note  Subjective: pt had revision of L AKA last night per Dr Sharol Given, then admitted to ICU for low BP's. Today is on low dose levo only at 2/ ug/min. CXR shows severe bilat infiltrates L> R. On FM O2 , alert and responding. Attempted regular HD this am but BP's dropped into 60's early and HD was aborted.   Vitals:   11/15/20 0930 11/15/20 1000 11/15/20 1030 11/15/20 1100  BP: 113/70 109/69 106/66 111/69  Pulse:  70 71 73  Resp: 16 12 12 16   Temp:      TempSrc:      SpO2:  99% 100% 100%  Weight:      Height:        Exam:   alert, nad , FM )2  no jvd  Chest cta bilat  Cor reg no RG  Abd soft ntnd no ascites   Ext L high AKA w/ wound vac, old R AKA 2+ edema   Alert, NF, ox3   RUE AVF+bruit    OP HD: [facility] TTS   4h  450/1.5  50.5kg  2/2 bath AVF 15ga  Hep none    - mircera 200 q2 last 5/19  - calcitriol 2 ug tiw  - home renal: phoslo 3 ac tid/ renvela 3 ac tid/ norvasc 10/ clonidine 0.1 bud/ hydralazine 100 bid   Assessment/ Plan: 1. Sepsis/ L AKA stump infection: L AKA stump infection refractory to outpatient doxy. Getting empiric IV abx and had revision of L AKA w/ wound VAC overnight 6/01 by Dr Sharol Given.  2. Hypotension/ shock: holding home BP lowering meds x 3, getting levo at low dose. Otherwise as above.  3. Volume: up 6-8kg by wts and CXR looks wet (vs aspiration), +edema on exam. UF w/ CRRT as tolerates.  4.  ESRD: usual HD TTS. Missed Tuesday HD. HD attempt did not work, will plan for CRRT.  5.  Anemia: Hgb 9.2. Due for esa, will order darbe 200 ug for today w/ HD. Holding iron in the setting of acute infection.  6.  Metabolic bone disease: cont vdra, hold binders for now 7. DM: SSI per priamry team   Rob Kristapher [place] 11/15/2020, 11:26 AM   Recent Labs  Lab 11/29/2020 2056 11/15/20 0427  K 5.6* 5.8*  BUN 60* 60*  CREATININE 6.27* 5.92*  CALCIUM 6.3* 6.5*  HGB 11.1* 10.8*   Inpatient medications: . (feeding supplement) PROSource Plus   30 mL Oral TID BM  . atorvastatin  10 mg Oral QHS  . calcitRIOL  2 mcg Oral Q T,Th,Sa-HD  . calcium acetate  1,334 mg Oral TID WC   And  . calcium acetate  667 mg Oral With snacks  . Chlorhexidine Gluconate Cloth  6 each Topical Q0600  . feeding supplement  1 Container Oral TID BM  . ferrous sulfate  325 mg Oral Daily  . heparin  5,000 Units Subcutaneous Q8H  . hydrocortisone sod succinate (SOLU-CORTEF) inj  100 mg Intravenous Q8H  . insulin aspart  0-6 Units Subcutaneous TID WC  . midodrine  10 mg Oral TID WC  . misoprostol  100 mcg Oral Q6H  . [START ON 11/16/2020] multivitamin  1 tablet Per Tube QHS  . nortriptyline  30 mg Oral QHS  . pantoprazole  40 mg Oral Daily  . sodium chloride flush  3 mL Intravenous Q12H  . vancomycin variable dose per unstable renal function (pharmacist dosing)   Does not apply See admin instructions   .  sodium chloride 10 mL/hr at 12/10/2020 1430  . sodium chloride    . sodium chloride    . ceFEPime (MAXIPIME) IV Stopped (10/19/2020 1959)  . dextrose    . metronidazole 100 mL/hr at 11/15/20 0800  . norepinephrine (LEVOPHED) Adult infusion 2 mcg/min (11/15/20 3291)   acetaminophen **OR** acetaminophen, calcium carbonate (dosed in mg elemental calcium), camphor-menthol **AND** hydrOXYzine, docusate sodium, feeding supplement (NEPRO CARB STEADY), morphine injection, ondansetron **OR** ondansetron (ZOFRAN) IV, polyethylene glycol, sorbitol, zolpidem

## 2020-11-15 NOTE — Progress Notes (Signed)
Updated Yarel Kilcrease Grosch's son, Breindel Collier at bedside regarding his mothers care.  Redmond School., MSN, APRN, AGACNP-BC [facility]  11/15/2020 , 3:59 PM  Please see [email or web address] for pager details  If no response, please call [phone] After hours, please call Elink at [phone]

## 2020-11-16 ENCOUNTER — Inpatient Hospital Stay (HOSPITAL_COMMUNITY): Payer: Medicare Other

## 2020-11-16 DIAGNOSIS — T8740 Infection of amputation stump, unspecified extremity: Secondary | ICD-10-CM

## 2020-11-16 DIAGNOSIS — Z515 Encounter for palliative care: Secondary | ICD-10-CM

## 2020-11-16 DIAGNOSIS — I5021 Acute systolic (congestive) heart failure: Secondary | ICD-10-CM

## 2020-11-16 DIAGNOSIS — N186 End stage renal disease: Secondary | ICD-10-CM | POA: Diagnosis not present

## 2020-11-16 DIAGNOSIS — Z7189 Other specified counseling: Secondary | ICD-10-CM

## 2020-11-16 DIAGNOSIS — R627 Adult failure to thrive: Secondary | ICD-10-CM

## 2020-11-16 DIAGNOSIS — E875 Hyperkalemia: Secondary | ICD-10-CM

## 2020-11-16 DIAGNOSIS — Z992 Dependence on renal dialysis: Secondary | ICD-10-CM | POA: Diagnosis not present

## 2020-11-16 LAB — RENAL FUNCTION PANEL
Albumin: 1.1 g/dL — ABNORMAL LOW (ref 3.5–5.0)
Albumin: <1 g/dL — ABNORMAL LOW (ref 3.5–5.0)
Anion gap: 22 — ABNORMAL HIGH (ref 5–15)
Anion gap: 13 (ref 5–15)
BUN: 39 mg/dL — ABNORMAL HIGH (ref 8–23)
BUN: 25 mg/dL — ABNORMAL HIGH (ref 8–23)
CO2: 11 mmol/L — ABNORMAL LOW (ref 22–32)
CO2: 14 mmol/L — ABNORMAL LOW (ref 22–32)
Calcium: 6.6 mg/dL — ABNORMAL LOW (ref 8.9–10.3)
Calcium: 6.2 mg/dL — CL (ref 8.9–10.3)
Chloride: 103 mmol/L (ref 98–111)
Chloride: 105 mmol/L (ref 98–111)
Creatinine, Ser: 3.84 mg/dL — ABNORMAL HIGH (ref 0.44–1.00)
Creatinine, Ser: 2.7 mg/dL — ABNORMAL HIGH (ref 0.44–1.00)
GFR, Estimated: 12 mL/min — ABNORMAL LOW (ref >60)
GFR, Estimated: 19 mL/min — ABNORMAL LOW (ref >60)
Glucose, Bld: 46 mg/dL — ABNORMAL LOW (ref 70–99)
Glucose, Bld: 215 mg/dL — ABNORMAL HIGH (ref 70–99)
Phosphorus: 7.7 mg/dL — ABNORMAL HIGH (ref 2.5–4.6)
Phosphorus: 5.1 mg/dL — ABNORMAL HIGH (ref 2.5–4.6)
Potassium: 6.4 mmol/L (ref 3.5–5.1)
Potassium: 4.7 mmol/L (ref 3.5–5.1)
Sodium: 136 mmol/L (ref 135–145)
Sodium: 132 mmol/L — ABNORMAL LOW (ref 135–145)

## 2020-11-16 LAB — GLUCOSE, CAPILLARY
Glucose-Capillary: 87 mg/dL (ref 70–99)
Glucose-Capillary: 66 mg/dL — ABNORMAL LOW (ref 70–99)
Glucose-Capillary: 89 mg/dL (ref 70–99)
Glucose-Capillary: 118 mg/dL — ABNORMAL HIGH (ref 70–99)
Glucose-Capillary: 98 mg/dL (ref 70–99)
Glucose-Capillary: 73 mg/dL (ref 70–99)
Glucose-Capillary: 98 mg/dL (ref 70–99)
Glucose-Capillary: 197 mg/dL — ABNORMAL HIGH (ref 70–99)
Glucose-Capillary: 186 mg/dL — ABNORMAL HIGH (ref 70–99)
Glucose-Capillary: 196 mg/dL — ABNORMAL HIGH (ref 70–99)
Glucose-Capillary: 205 mg/dL — ABNORMAL HIGH (ref 70–99)
Glucose-Capillary: 244 mg/dL — ABNORMAL HIGH (ref 70–99)

## 2020-11-16 LAB — ECHOCARDIOGRAM COMPLETE
AR max vel: 1.11 cm2
AV Area VTI: 1.26 cm2
AV Area mean vel: 1.02 cm2
AV Mean grad: 11 mmHg
AV Peak grad: 24 mmHg
Ao pk vel: 2.45 m/s
Height: 67 in
MV M vel: 5.15 m/s
MV Peak grad: 106.1 mmHg
P 1/2 time: 297 msec
Radius: 0.7 cm
Weight: 2077.62 oz

## 2020-11-16 LAB — CBC
HCT: 24.7 % — ABNORMAL LOW (ref 36.0–46.0)
Hemoglobin: 7.8 g/dL — ABNORMAL LOW (ref 12.0–15.0)
MCH: 28.2 pg (ref 26.0–34.0)
MCHC: 31.6 g/dL (ref 30.0–36.0)
MCV: 89.2 fL (ref 80.0–100.0)
Platelets: 18 10*3/uL — CL (ref 150–400)
RBC: 2.77 MIL/uL — ABNORMAL LOW (ref 3.87–5.11)
RDW: 18.5 % — ABNORMAL HIGH (ref 11.5–15.5)
WBC: 9 10*3/uL (ref 4.0–10.5)
nRBC: 9 % — ABNORMAL HIGH (ref 0.0–0.2)

## 2020-11-16 LAB — POCT ACTIVATED CLOTTING TIME
Activated Clotting Time: 190 seconds
Activated Clotting Time: 220 seconds
Activated Clotting Time: 220 seconds

## 2020-11-16 LAB — HEPATIC FUNCTION PANEL
ALT: 498 U/L — ABNORMAL HIGH (ref 0–44)
AST: 1544 U/L — ABNORMAL HIGH (ref 15–41)
Albumin: <1 g/dL — ABNORMAL LOW (ref 3.5–5.0)
Alkaline Phosphatase: 295 U/L — ABNORMAL HIGH (ref 38–126)
Bilirubin, Direct: 1.2 mg/dL — ABNORMAL HIGH (ref 0.0–0.2)
Indirect Bilirubin: 0.7 mg/dL (ref 0.3–0.9)
Total Bilirubin: 1.9 mg/dL — ABNORMAL HIGH (ref 0.3–1.2)
Total Protein: <3 g/dL — ABNORMAL LOW (ref 6.5–8.1)

## 2020-11-16 LAB — BASIC METABOLIC PANEL
Anion gap: 15 (ref 5–15)
BUN: 22 mg/dL (ref 8–23)
CO2: 17 mmol/L — ABNORMAL LOW (ref 22–32)
Calcium: 6 mg/dL — CL (ref 8.9–10.3)
Chloride: 101 mmol/L (ref 98–111)
Creatinine, Ser: 2.35 mg/dL — ABNORMAL HIGH (ref 0.44–1.00)
GFR, Estimated: 22 mL/min — ABNORMAL LOW (ref >60)
Glucose, Bld: 212 mg/dL — ABNORMAL HIGH (ref 70–99)
Potassium: 4.5 mmol/L (ref 3.5–5.1)
Sodium: 133 mmol/L — ABNORMAL LOW (ref 135–145)

## 2020-11-16 LAB — COOXEMETRY PANEL
Carboxyhemoglobin: 0.8 % (ref 0.5–1.5)
Methemoglobin: 1.4 % (ref 0.0–1.5)
O2 Saturation: 49 %
Total hemoglobin: 7.1 g/dL — ABNORMAL LOW (ref 12.0–16.0)

## 2020-11-16 LAB — ZINC: Zinc: 56 ug/dL (ref 44–115)

## 2020-11-16 LAB — MAGNESIUM: Magnesium: 2.2 mg/dL (ref 1.7–2.4)

## 2020-11-16 LAB — LACTIC ACID, PLASMA: Lactic Acid, Venous: 7.7 mmol/L (ref 0.5–1.9)

## 2020-11-16 LAB — DIC (DISSEMINATED INTRAVASCULAR COAGULATION)PANEL
D-Dimer, Quant: >20 ug/mL-FEU — ABNORMAL HIGH (ref 0.00–0.50)
Fibrinogen: 106 mg/dL — ABNORMAL LOW (ref 210–475)
INR: 7.8 (ref 0.8–1.2)
Platelets: 16 10*3/uL — CL (ref 150–400)
Prothrombin Time: 65.4 seconds — ABNORMAL HIGH (ref 11.4–15.2)
Smear Review: NONE SEEN
aPTT: >200 seconds (ref 24–36)

## 2020-11-16 LAB — AMMONIA: Ammonia: 49 umol/L — ABNORMAL HIGH (ref 9–35)

## 2020-11-16 LAB — HEPATITIS B SURFACE ANTIBODY, QUANTITATIVE: Hep B S AB Quant (Post): <3.1 m[IU]/mL — ABNORMAL LOW (ref >9.9)

## 2020-11-16 LAB — APTT: aPTT: >200 seconds (ref 24–36)

## 2020-11-16 LAB — PHOSPHORUS: Phosphorus: 4.2 mg/dL (ref 2.5–4.6)

## 2020-11-16 MED ORDER — DEXTROSE 50 % IV SOLN
1.0000 | Freq: Once | INTRAVENOUS | Status: AC
  Administered 2020-11-16: 50 mL via INTRAVENOUS

## 2020-11-16 MED ORDER — ZOLPIDEM TARTRATE 5 MG PO TABS: 5.0000 mg | ORAL_TABLET | Freq: Every evening | ORAL | Status: DC | PRN

## 2020-11-16 MED ORDER — DEXTROSE 10 % IV SOLN: INTRAVENOUS | Status: DC

## 2020-11-16 MED ORDER — OXYCODONE HCL 5 MG PO TABS: 5.0000 mg | ORAL_TABLET | ORAL | Status: DC | PRN

## 2020-11-16 MED ORDER — SODIUM BICARBONATE 8.4 % IV SOLN
INTRAVENOUS | Status: AC
  Filled 2020-11-16: qty 50

## 2020-11-16 MED ORDER — DEXTROSE 50 % IV SOLN
INTRAVENOUS | Status: AC
  Filled 2020-11-16: qty 50

## 2020-11-16 MED ORDER — ONDANSETRON HCL 4 MG/2ML IJ SOLN: 4.0000 mg | Freq: Four times a day (QID) | INTRAMUSCULAR | Status: DC | PRN

## 2020-11-16 MED ORDER — ONDANSETRON HCL 4 MG PO TABS: 4.0000 mg | ORAL_TABLET | Freq: Four times a day (QID) | ORAL | Status: DC | PRN

## 2020-11-16 MED ORDER — VITAL 1.5 CAL PO LIQD
1000.0000 mL | ORAL | Status: DC
  Administered 2020-11-16: 1000 mL
  Filled 2020-11-16: qty 1000

## 2020-11-16 MED ORDER — SODIUM CHLORIDE 0.9% FLUSH
10.0000 mL | Freq: Two times a day (BID) | INTRAVENOUS | Status: DC
Start: 2020-11-16 — End: 2020-11-17
  Administered 2020-11-16: 10 mL

## 2020-11-16 MED ORDER — SODIUM BICARBONATE 8.4 % IV SOLN
100.0000 meq | Freq: Once | INTRAVENOUS | Status: AC
  Administered 2020-11-16: 100 meq via INTRAVENOUS

## 2020-11-16 MED ORDER — CAMPHOR-MENTHOL 0.5-0.5 % EX LOTN
1.0000 "application " | TOPICAL_LOTION | Freq: Three times a day (TID) | CUTANEOUS | Status: DC | PRN
  Filled 2020-11-16: qty 222

## 2020-11-16 MED ORDER — CALCIUM CARBONATE ANTACID 1250 MG/5ML PO SUSP
500.0000 mg | Freq: Four times a day (QID) | ORAL | Status: DC | PRN
  Filled 2020-11-16: qty 5

## 2020-11-16 MED ORDER — ACETAMINOPHEN 650 MG RE SUPP: 650.0000 mg | Freq: Four times a day (QID) | RECTAL | Status: DC | PRN

## 2020-11-16 MED ORDER — DOCUSATE SODIUM 50 MG/5ML PO LIQD: 100.0000 mg | Freq: Every day | ORAL | Status: DC

## 2020-11-16 MED ORDER — FERROUS SULFATE 300 (60 FE) MG/5ML PO SYRP
300.0000 mg | ORAL_SOLUTION | Freq: Every day | ORAL | Status: DC
  Filled 2020-11-16: qty 5

## 2020-11-16 MED ORDER — POLYETHYLENE GLYCOL 3350 17 G PO PACK: 17.0000 g | PACK | Freq: Every day | ORAL | Status: DC | PRN

## 2020-11-16 MED ORDER — PANTOPRAZOLE SODIUM 40 MG IV SOLR
40.0000 mg | INTRAVENOUS | Status: DC
  Administered 2020-11-16: 40 mg via INTRAVENOUS
  Filled 2020-11-16: qty 40

## 2020-11-16 MED ORDER — DARBEPOETIN ALFA 200 MCG/0.4ML IJ SOSY
200.0000 ug | PREFILLED_SYRINGE | INTRAMUSCULAR | Status: DC
  Administered 2020-11-16: 200 ug via SUBCUTANEOUS
  Filled 2020-11-16: qty 0.4

## 2020-11-16 MED ORDER — HYDROXYZINE HCL 25 MG PO TABS: 25.0000 mg | ORAL_TABLET | Freq: Three times a day (TID) | ORAL | Status: DC | PRN

## 2020-11-16 MED ORDER — PHENYLEPHRINE HCL-NACL 10-0.9 MG/250ML-% IV SOLN
0.0000 ug/min | INTRAVENOUS | Status: DC
  Administered 2020-11-16: 160 ug/min via INTRAVENOUS
  Administered 2020-11-16: 20 ug/min via INTRAVENOUS
  Filled 2020-11-16: qty 1000
  Filled 2020-11-16: qty 250

## 2020-11-16 MED ORDER — CALCIUM ACETATE (PHOS BINDER) 667 MG PO CAPS
1334.0000 mg | ORAL_CAPSULE | Freq: Three times a day (TID) | ORAL | Status: DC
  Administered 2020-11-16: 1334 mg via ORAL
  Filled 2020-11-16 (×2): qty 2

## 2020-11-16 MED ORDER — SODIUM CHLORIDE 0.9% FLUSH: 10.0000 mL | INTRAVENOUS | Status: DC | PRN

## 2020-11-16 MED ORDER — PANTOPRAZOLE SODIUM 40 MG IV SOLR
40.0000 mg | Freq: Two times a day (BID) | INTRAVENOUS | Status: DC
  Administered 2020-11-16: 40 mg via INTRAVENOUS
  Filled 2020-11-16: qty 40

## 2020-11-16 MED ORDER — PHENYLEPHRINE HCL-NACL 10-0.9 MG/250ML-% IV SOLN
INTRAVENOUS | Status: AC
  Filled 2020-11-16: qty 250

## 2020-11-16 MED ORDER — SORBITOL 70 % SOLN
30.0000 mL | Status: DC | PRN
  Filled 2020-11-16: qty 30

## 2020-11-16 MED ORDER — STERILE WATER FOR INJECTION IV SOLN
INTRAVENOUS | Status: DC
  Filled 2020-11-16 (×2): qty 1000

## 2020-11-16 MED ORDER — PHENYLEPHRINE CONCENTRATED 100MG/250ML (0.4 MG/ML) INFUSION SIMPLE
0.0000 ug/min | INTRAVENOUS | Status: DC
  Administered 2020-11-16: 300 ug/min via INTRAVENOUS
  Administered 2020-11-16: 240 ug/min via INTRAVENOUS
  Administered 2020-11-17: 300 ug/min via INTRAVENOUS
  Filled 2020-11-16 (×4): qty 250

## 2020-11-16 MED ORDER — MIDODRINE HCL 5 MG PO TABS: 10.0000 mg | ORAL_TABLET | Freq: Three times a day (TID) | ORAL | Status: DC

## 2020-11-16 MED ORDER — PIPERACILLIN-TAZOBACTAM 3.375 G IVPB 30 MIN
3.3750 g | Freq: Four times a day (QID) | INTRAVENOUS | Status: DC
  Administered 2020-11-16 – 2020-11-17 (×3): 3.375 g via INTRAVENOUS
  Filled 2020-11-16 (×8): qty 50

## 2020-11-16 MED ORDER — CHLORHEXIDINE GLUCONATE CLOTH 2 % EX PADS
6.0000 | MEDICATED_PAD | Freq: Every day | CUTANEOUS | Status: DC
  Administered 2020-11-16: 6 via TOPICAL

## 2020-11-16 MED ORDER — ACETAMINOPHEN 325 MG PO TABS: 650.0000 mg | ORAL_TABLET | Freq: Four times a day (QID) | ORAL | Status: DC | PRN

## 2020-11-16 MED ORDER — MISOPROSTOL 100 MCG PO TABS
100.0000 ug | ORAL_TABLET | Freq: Four times a day (QID) | ORAL | Status: DC
  Administered 2020-11-16: 100 ug
  Filled 2020-11-16 (×3): qty 1

## 2020-11-16 MED ORDER — CALCIUM ACETATE (PHOS BINDER) 667 MG PO CAPS: 667.0000 mg | ORAL_CAPSULE | ORAL | Status: DC | PRN

## 2020-11-16 MED ORDER — PROSOURCE TF PO LIQD
45.0000 mL | Freq: Three times a day (TID) | ORAL | Status: DC
  Administered 2020-11-16 (×2): 45 mL
  Filled 2020-11-16 (×2): qty 45

## 2020-11-16 MED ORDER — ATORVASTATIN CALCIUM 10 MG PO TABS
10.0000 mg | ORAL_TABLET | Freq: Every day | ORAL | Status: DC
  Administered 2020-11-16: 10 mg
  Filled 2020-11-16: qty 1

## 2020-11-16 MED ORDER — HYDROCORTISONE NA SUCCINATE PF 100 MG IJ SOLR
50.0000 mg | Freq: Four times a day (QID) | INTRAMUSCULAR | Status: DC
  Administered 2020-11-16 – 2020-11-17 (×3): 50 mg via INTRAVENOUS
  Filled 2020-11-16 (×3): qty 2

## 2020-11-16 NOTE — Progress Notes (Signed)
Inpatient Rehabilitation Admissions Coordinator  We sill sign off at this time. Please re consult if becomes appropriate to consider for CIR admit.  Danne Baxter, RN, MSN Rehab Admissions Coordinator [phone] 11/16/2020 1:35 PM

## 2020-11-16 NOTE — Progress Notes (Signed)
LTM EEG hooked up and running - no initial skin breakdown - push button tested - neuro notified. [facility] monitoring.  

## 2020-11-16 NOTE — Progress Notes (Signed)
Pt in severe BiV heart failure. Likely causing her other organ failures with liver/coagulopathy/and certainly resp.   I have placed consult for hf to be able to optimize pt but fearful that with her esrd the options are limited and prognosis poor.   Certainly DNR is appropriate for this critically ill pt at this time.

## 2020-11-16 NOTE — Procedures (Signed)
Arterial Catheter Insertion Procedure Note  Brandy Houston  [id number]  1955/11/14  Date:11/16/20  Time:10:31 AM    Provider Performing: Bayard Beaver    Procedure: Insertion of Arterial Line [phone]) without US guidance  Indication(s) Blood pressure monitoring and/or need for frequent ABGs  Consent Risks of the procedure as well as the alternatives and risks of each were explained to the patient and/or caregiver.  Consent for the procedure was obtained and is signed in the bedside chart  Anesthesia None   Time Out Verified patient identification, verified procedure, site/side was marked, verified correct patient position, special equipment/implants available, medications/allergies/relevant history reviewed, required imaging and test results available.   Sterile Technique Maximal sterile technique including full sterile barrier drape, hand hygiene, sterile gown, sterile gloves, mask, hair covering, sterile ultrasound probe cover (if used).   Procedure Description Area of catheter insertion was cleaned with chlorhexidine and draped in sterile fashion. Without real-time ultrasound guidance an arterial catheter was placed into the left radial artery.  Appropriate arterial tracings confirmed on monitor.     Complications/Tolerance None; patient tolerated the procedure well.   EBL Minimal   Specimen(s) None

## 2020-11-16 NOTE — Progress Notes (Signed)
PT Cancellation Note  Patient Details Name: Brandy Houston MRN: [id number] DOB: 03/06/1956   Cancelled Treatment:    Reason Eval/Treat Not Completed: Medical issues which prohibited therapy (Pt unstable per RN.  Pt on CRRT and EEG. will check back at later date.)   [name] 11/16/2020, 12:13 PM [name],PT Acute Rehab Services [phone] (pager)

## 2020-11-16 NOTE — Progress Notes (Signed)
VAST consulted to obtain IV access. Pt's right arm restricted d/t dialysis access. Left arm assessed utilizing ultrasound; no appropriate vessels for USGIV access visualized. Pt's nurse notified.

## 2020-11-16 NOTE — Progress Notes (Signed)
[facility] Progress Note  Subjective: on CRRT, heparin added for multiple clotting episodes and is doing better from clotting standpoint. Overall though remains very sick w/ rising lactic acid and 3 pressors on board.   Vitals:   11/16/20 1030 11/16/20 1045 11/16/20 1100 11/16/20 1134  BP:      Pulse:      Resp: 19 19 17    Temp:    (!) 97.4 F (36.3 C)  TempSrc:    Axillary  SpO2:      Weight:      Height:        Exam:   alert, nad , FM )2  no jvd  Chest cta bilat  Cor reg no RG  Abd soft ntnd no ascites   Ext L high AKA w/ wound vac, old R AKA 2+ edema   Alert, NF, ox3   RUE AVF+bruit    OP HD: [facility] TTS   4h  450/1.5  50.5kg  2/2 bath AVF 15ga  Hep none    - mircera 200 q2 last 5/19  - calcitriol 2 ug tiw  - home renal: phoslo 3 ac tid/ renvela 3 ac tid/ norvasc 10/ clonidine 0.1 bud/ hydralazine 100 bid   Assessment/ Plan: 1. Hypotension/ septic shock: not much better today, on 3 pressors now.  2. Sepsis/ L AKA stump infection: L AKA stump infection refractory to outpatient doxy. SP revision of L AKA w/ wound VAC overnight 6/01 by Dr Sharol Given. On broad spec IV abx.  3. Volume: up 6-8kg by wts and CXR looks wet (vs aspiration), +edema on exam. Unable to pull fluid w/ CRRT d/t severe sepsis.   4.  ESRD: usual HD TTS. Missed Tuesday HD. HD attempt failed on 6/02. CRRT started 6/02.  5.  Anemia: Hgb 9.2. Due for esa. Plan darbe 200 ug sq today and q Friday. Holding iron in the setting of acute infection.  6.  Metabolic bone disease: cont vdra, hold binders for now 7. DM: SSI per priamry team   Rob Kostantinos [place] 11/16/2020, 1:03 PM   Recent Labs  Lab 11/15/20 2334 11/16/20 0914 11/16/20 1116 11/16/20 1126  K 4.7 6.4* 4.7  --   BUN  --  39* 25*  --   CREATININE  --  3.84* 2.70*  --   CALCIUM  --  6.6* 6.2*  --   PHOS  --  7.7* 5.1*  --   HGB 8.8*  --   --  7.8*   Inpatient medications: . atorvastatin  10 mg Oral QHS  . calcitRIOL  2 mcg Oral Q  T,Th,Sa-HD  . calcium acetate  1,334 mg Oral TID WC  . chlorhexidine  15 mL Mouth Rinse BID  . Chlorhexidine Gluconate Cloth  6 each Topical Q0600  . dextrose      . docusate sodium  100 mg Oral Daily  . feeding supplement (PROSource TF)  45 mL Per Tube TID  . ferrous sulfate  325 mg Oral Daily  . [START ON 12/09/20] heparin  5,000 Units Subcutaneous Q8H  . hydrocortisone sod succinate (SOLU-CORTEF) inj  50 mg Intravenous Q6H  . mouth rinse  15 mL Mouth Rinse q12n4p  . midodrine  10 mg Oral TID WC  . misoprostol  100 mcg Oral Q6H  . multivitamin  1 tablet Per Tube QHS  . nortriptyline  30 mg Oral QHS  . pantoprazole (PROTONIX) IV  40 mg Intravenous Q24H  . sodium bicarbonate      .  sodium bicarbonate      . sodium chloride flush  3 mL Intravenous Q12H   .  prismasol BGK 4/2.5 400 mL/hr at 11/16/20 0648  .  prismasol BGK 4/2.5 200 mL/hr at 11/15/20 1603  . sodium chloride 10 mL/hr at 11/16/20 1200  . sodium chloride    . sodium chloride    . dextrose 125 mL/hr at 11/16/20 1200  . feeding supplement (VITAL 1.5 CAL) Stopped (11/16/20 1138)  . heparin 10,000 units/ 20 mL infusion syringe 300 Units/hr (11/16/20 1200)  . norepinephrine (LEVOPHED) Adult infusion Stopped (11/16/20 1142)  . phenylephrine (NEO-SYNEPHRINE) Adult infusion 20 mcg/min (11/16/20 1252)  . phenylephrine    . piperacillin-tazobactam    . prismasol BGK 4/2.5 1,800 mL/hr at 11/16/20 1253  .  sodium bicarbonate (isotonic) infusion in sterile water 75 mL/hr at 11/16/20 1231  . vasopressin Stopped (11/16/20 1146)   acetaminophen **OR** acetaminophen, alteplase, calcium acetate **AND** calcium acetate, calcium carbonate (dosed in mg elemental calcium), camphor-menthol **AND** hydrOXYzine, docusate sodium, heparin, HYDROmorphone (DILAUDID) injection, morphine injection, ondansetron **OR** ondansetron (ZOFRAN) IV, oxyCODONE, phenol, polyethylene glycol, sodium chloride, sorbitol, zolpidem

## 2020-11-16 NOTE — Progress Notes (Signed)
Daily Progress Note   Patient Name: Brandy Houston       Date: 11/16/2020 DOB: 1956-01-24  Age: 65 y.o. MRN#: [id number] Attending Physician: Audria Nine, DO Primary Care Physician: [name], MD Admit Date: 10/17/2020  Reason for Consultation/Follow-up: Establishing goals of care  Subjective: Met with patient's son Hilliard Clark, and daughter [name] in person.  Her other son Roderic Palau was present via speaker phone. They described Ms. Rom has a strong and independent woman.  She does not enjoy being fussed over. They describe an acute decline in the past 2 weeks with her sleeping more than she is awake, not eating, not being able to care for herself.  She has lost a great deal of weight-they showed me a picture from Christmas where Ms. Bagg looked very well and strong despite being in a wheelchair due to her amputation.  Prior to her acute decline 2 weeks ago she was able to push herself around the house in her wheelchair and complete her own daily activities she was able to cook for herself she enjoyed going on her porch to smoke.   We reviewed her current critical illness as well as her accompanying comorbidities.  Goals of care were discussed in the context of Netha's values. CODE STATUS was discussed.  In the event that Ms. Chan suffered cardiac and respiratory arrest it is unlikely that CPR would restore her to the independent state that she values. All family was in agreement with this and agreed with DNR order. She has already had a core track placed.  We discussed utilizing the core track for artificial feeding and the administration of pain medicine.  We discussed the difference between ongoing aggressive medical care versus a more comfort focused care.  Family at first stated patient would  not want artificial feeding.  We discussed that in her current state if current medical interventions were going to continue then it would be appropriate to start tube feeding and using the core track for oral medications. We also discussed that tube feeding can (and should) be stopped if decision is made for transition to full comfort measures only.     Review of Systems  Unable to perform ROS: Acuity of condition   Length of Stay: 3  Current Medications: Scheduled Meds:  . atorvastatin  10 mg Oral  QHS  . calcitRIOL  2 mcg Oral Q T,Th,Sa-HD  . calcium acetate  1,334 mg Oral TID WC  . chlorhexidine  15 mL Mouth Rinse BID  . Chlorhexidine Gluconate Cloth  6 each Topical Q0600  . darbepoetin (ARANESP) injection - NON-DIALYSIS  200 mcg Subcutaneous Q Fri-1800  . dextrose      . docusate sodium  100 mg Oral Daily  . feeding supplement (PROSource TF)  45 mL Per Tube TID  . ferrous sulfate  325 mg Oral Daily  . [START ON 2020/11/24] heparin  5,000 Units Subcutaneous Q8H  . hydrocortisone sod succinate (SOLU-CORTEF) inj  50 mg Intravenous Q6H  . mouth rinse  15 mL Mouth Rinse q12n4p  . midodrine  10 mg Oral TID WC  . misoprostol  100 mcg Oral Q6H  . multivitamin  1 tablet Per Tube QHS  . nortriptyline  30 mg Oral QHS  . pantoprazole (PROTONIX) IV  40 mg Intravenous Q24H  . sodium bicarbonate      . sodium bicarbonate      . sodium chloride flush  3 mL Intravenous Q12H    Continuous Infusions: .  prismasol BGK 4/2.5 400 mL/hr at 11/16/20 0648  .  prismasol BGK 4/2.5 200 mL/hr at 11/15/20 1603  . sodium chloride Stopped (11/16/20 1243)  . sodium chloride    . sodium chloride    . dextrose 125 mL/hr at 11/16/20 1310  . feeding supplement (VITAL 1.5 CAL) Stopped (11/16/20 1138)  . heparin 10,000 units/ 20 mL infusion syringe 300 Units/hr (11/16/20 1200)  . norepinephrine (LEVOPHED) Adult infusion Stopped (11/16/20 1231)  . phenylephrine (NEO-SYNEPHRINE) Adult infusion 120 mcg/min  (11/16/20 1310)  . phenylephrine    . piperacillin-tazobactam    . prismasol BGK 4/2.5 1,800 mL/hr at 11/16/20 1253  .  sodium bicarbonate (isotonic) infusion in sterile water 75 mL/hr at 11/16/20 1310  . vasopressin 0.04 Units/min (11/16/20 1310)    PRN Meds: acetaminophen **OR** acetaminophen, alteplase, calcium acetate **AND** calcium acetate, calcium carbonate (dosed in mg elemental calcium), camphor-menthol **AND** hydrOXYzine, docusate sodium, heparin, HYDROmorphone (DILAUDID) injection, morphine injection, ondansetron **OR** ondansetron (ZOFRAN) IV, oxyCODONE, phenol, polyethylene glycol, sodium chloride, sorbitol, zolpidem  Physical Exam Vitals and nursing note reviewed.  Constitutional:      Appearance: She is ill-appearing and toxic-appearing.     Comments: cachectic  Pulmonary:     Comments: Appears labored on NRB Neurological:     Mental Status: She is disoriented.     Comments: Lethargic, answers questions minimally             Vital Signs: BP 103/63   Pulse 87   Temp (!) 97.4 F (36.3 C) (Axillary)   Resp 17   Ht _0  (1.702 m)   Wt 58.9 kg   LMP 03/16/2002   SpO2 97%   BMI 20.34 kg/m  SpO2: SpO2: 97 % O2 Device: O2 Device: NRB O2 Flow Rate: O2 Flow Rate (L/min): 15 L/min  Intake/output summary:   Intake/Output Summary (Last 24 hours) at 11/16/2020 1310 Last data filed at 11/16/2020 1310 Gross per 24 hour  Intake 2864.87 ml  Output 1054 ml  Net 1810.87 ml   LBM: Last BM Date: 11/12/20 Baseline Weight: Weight: 58.1 kg Most recent weight: Weight: 58.9 kg       Palliative Assessment/Data: PPS: 10%      Patient Active Problem List   Diagnosis Date Noted  . Failure to thrive in adult   . Hyperkalemia   .  Malnutrition of moderate degree 11/22/2020  . Wound dehiscence   . Abscess of left thigh   . Hypoglycemia   . Acute metabolic encephalopathy   . Septic shock ([place]) 11/05/2020  . Amputation stump infection ([place]) 10/22/2020  . Acute upper GI  bleeding 09/08/2020  . Dyslipidemia 09/08/2020  . Esophageal candidiasis ([place]) 06/25/2020  . Intestinal angiodysplasia   . GIB (gastrointestinal bleeding) 06/21/2020  . Lab test positive for detection of COVID-19 virus 06/21/2020  . Rectal bleeding 06/20/2020  . Coronary artery disease of native artery of native heart with stable angina pectoris ([place]) 06/13/2020  . Nonrheumatic aortic valve insufficiency 06/13/2020  . Nonrheumatic tricuspid valve regurgitation 06/13/2020  . GAVE (gastric antral vascular ectasia)   . AVM (arteriovenous malformation) of small bowel, acquired with hemorrhage   . Hip swelling, right 05/27/2020  . Hypokalemia 05/27/2020  . Anemia of chronic renal failure 05/27/2020  . 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 Acute combined systolic and diastolic heart failure ([place])   . Unstable angina ([place])   . Symptomatic anemia 01/27/2020  . Hypercalcemia 10/06/2019  . Acute respiratory failure with hypoxia ([place]) 06/05/2019  . Severe protein-calorie malnutrition ([place]) 05/11/2018  . ESRD on hemodialysis ([place]) 04/19/2018  . Iron deficiency anemia, unspecified 04/19/2018  . Nephrotic syndrome with unspecified morphologic changes 04/19/2018  . Secondary hyperparathyroidism of renal origin ([employer]) 04/19/2018  . Nicotine dependence, cigarettes, uncomplicated 02/77/4128  . Essential hypertension   . Amputation above knee ([place])   . ABLA (acute blood loss anemia)   . DM type 2 causing ESRD ([place]) 03/21/2017  . Anemia in chronic kidney disease 02/05/2017  . Chronic diastolic CHF (congestive heart failure) ([place]) 11/08/2015  . Diabetic peripheral neuropathy ([place])   . Major depression, recurrent, chronic ([place])   . Chronic kidney disease (CKD), stage IV (severe) ([place])   . Hepatitis C 08/16/2014  . Peripheral neuropathy 06/24/2013    Palliative Care Assessment & Plan   Patient Profile: 65 y.o. female  with past medical history of ESRD on HD, L AKA,  DM, recurrent GI bleeding with AVMs, PVD, HTN, Chronic diastolic heart failure (last ECHO 05/09/2020 showed EF 40-45%, globalhypokinesis of L ventricle pericardial effusion, tricuspid and aortic valve regurgitation, with moderate pericardial effusion- noted this was improved from previous 10/21 ECHO) s/p admitted on 10/26/2020 with septic shock related to infected stump wound. Underwent revision of AKA. Developed hypotension with anesthesia and started on pressors. Now with pulmonary edema- volume overload- has missed HD appointments prior to admission. Palliative medicine consulted for goals of care.   Assessment/Recommendations/Plan   DNR  Continue current care- if patient declines, fails to improve- family would consider comfort measures only  PMT will continue to follow- plan for followup meeting tomorrow at 1pm  Goals of Care and Additional Recommendations:  Limitations on Scope of Treatment: Full Scope Treatment  Code Status:  DNR  Prognosis:   Unable to determine  Discharge Planning:  To Be Determined  Care plan was discussed with patient's children.  Thank you for allowing the Palliative Medicine Team to assist in the care of this patient.   68 mins Greater than 50%  of this time was spent counseling and coordinating care related to the above assessment and plan.  Mariana Kaufman, AGNP-C Palliative Medicine   Please contact Palliative Medicine Team phone at [phone] for questions and concerns.

## 2020-11-16 NOTE — Progress Notes (Signed)
Patient is postop day 2 status post above-knee amputation revision.  Wound VAC is functioning.  The canister.

## 2020-11-16 NOTE — Progress Notes (Signed)
OT Cancellation Note  Patient Details Name: Brandy Houston MRN: [id number] DOB: May 21, 1956   Cancelled Treatment:    Reason Eval/Treat Not Completed: Medical issues which prohibited therapy;Patient not medically ready per RN. OT to check back tomorrow.   Gloris Manchester OTR/L Supplemental OT, Department of rehab services [phone]  Jordynne Mccown R H. 11/16/2020, 10:15 AM

## 2020-11-16 NOTE — Progress Notes (Signed)
eLink Physician-Brief Progress Note Patient Name: Brandy Houston DOB: [birth year] MRN: [id number]   Date of Service  11/16/2020  HPI/Events of Note  Hyperglycemia - Blood glucose = 244.  eICU Interventions  Plan: 1. Decrease D10W IV infusion from 125 mL/hour to 75 mL/hour.     Intervention Category Major Interventions: Hyperglycemia - active titration of insulin therapy  Bethanne Mule [place] 11/16/2020, 8:08 PM

## 2020-11-16 NOTE — Progress Notes (Addendum)
NAME:  Brandy Houston, MRN:  [id number], DOB:  26-Dec-1955, LOS: 3 ADMISSION DATE:  10/30/2020, CONSULTATION DATE:  12/08/2020 REFERRING MD:  Trellis Moment MD, Reason for Consult:  Hypotension  History of Present Illness:   65 y.o. F who presented to [facility] on 5/31 with concerns of left stump infection.   She underwent a revision of the left AKA with Dr. Sharol Given on 6/1.  She was extubated and taken to the PACU. In the PACU she was found to be hypotensive, Hypoglycemic with a BG less than 10.  PCCM was consulted for assistance and transfer to ICU on 6/1.   Pertinent  Medical History  DM, PVD S/P AKA in 2008, ESRD on HD, CAD, HTN,  recurrent GIB, Hep C   Significant Hospital Events: Including procedures, antibiotic start and stop dates in addition to other pertinent events   . 5/31 Admit. sepsis workup was initated in the ED. Vanc, Cefepime, and Flagyl were started after [facility] were drawn . 6/1 OR for L AKA. Hypotensive 2U PRBC admin, hypoglycemic. PCCM consult. Transfer to ICU. HD cath placed. Got 2 units PRBC . 6/2 palliative consulted. [place] discussion. Pt's family req full code. Did not tolerated iHD due to hypotension and had Loss on MS. CRRT started. Glucose low 31. Increased vasoactive gtt needed. Increased oxygen needs. D10 started   . 6/3 neuro consulted for LTM given MS change seems to happen PRIOR to hypotension and was having episodes of sudden MS change as out-pt   Interim History / Subjective:    Objective   Blood pressure 98/61, pulse 87, temperature (Abnormal) 92.3 F (33.5 C), temperature source Axillary, resp. rate 18, height 5\' 7"  (1.702 m), weight 58.9 kg, last menstrual period 03/16/2002, SpO2 97 %. on 4LNC        Intake/Output Summary (Last 24 hours) at 11/16/2020 4627 Last data filed at 11/16/2020 0800 Gross per 24 hour  Intake 1975.11 ml  Output 861 ml  Net 1114.11 ml   Filed Weights   11/22/2020 1303 11/15/20 1150 11/16/20 0500  Weight: 58.1 kg 60.4 kg 58.9 kg     Examination:  General this is a 65 year old female who appears much older than stated age and now presents acutely ill (superimposed on her chronic illness) HENT temporal wasting. MM dry pulm dec bases no accessory use currently on 100% NRB Card rrr systolic HM present  Ext LL ext stump w/ wound vac in place. Also has RLE amputation. LUE fistula w/ weak bruit and thrill. Pulses weak abd soft Neuro opens eyes. Tries to communicate. Not currently verbal GU anuric   Labs/imaging that I havepersonally reviewed  (right click and "Reselect all SmartList Selections" daily)  See below  Resolved Hospital Problem list     Assessment & Plan:   Circulatory shock mix of sepsis/septic shock (infected stump) +/- initially volume depletion  -she is s/p stump[ revision 6/1 -has h/o HFrEF so also wonder about cardiogenic component. Acidosis may also be contributing to this.  Plan Keep euvolemic at this point F/u pending cultures  Day 4 abx currently on cefepime, vanc and flagyl  Cont midodrine Cont stress dose steroids (started 6/2) Titrate pressors for SBP > 100 Awaiting f/u cbc this am  Repeat ECHO   LLE AKA Revision OR with Dr. Sharol Given on 6.1 Plan Cont wound care Daily cbc   Hypoglycemia in setting of sepsis. Has h/o DM -started on D10 6/2 Plan Cont stress dose steroids Cont D 10  Stopping insulin  Acute Metabolic Encephalopathy-h/o mood disorder S/P surgery, hypotension, missed dialysis, and hypoglycemia. Seems very sensitive to hypoglycemia Plan Serial neuro checks Supportive care On nortriptyline at baseline not able to take now I have reached out to neuro -->? LTM   Anemia of Chronic Illness vs Acute Blood Loss Anemia HBG appears to be around  7.5-9 at baseline. Hx of GI AVM's. 2 units PRBC 6/1 Hgb 10.8 Plan Trend cbc Awaiting this am's CBC   Acute Respiratory Failure with Hypoxia; req increased Oxygen again overnight MS and cough mechanics increasing risk of  atelectasis .  At risk for on-going volume overload Plan Pulse ox NPO Hope can avoid intubation CXR today   ESRD on TTS HD Plan CRRT for now  Serial chems   Hyponatremia Plan Trend chems  H/o CAD/PVD, HTN, HL Plan Holding Imdur  Ok to cont lipitor   H/O GIB Plan Trend cbc    Best practice (right click and "Reselect all SmartList Selections" daily)  Diet:  Tube Feed  Pain/Anxiety/Delirium protocol (if indicated): No VAP protocol (if indicated): Not indicated DVT prophylaxis: Subcutaneous Heparin and SCD GI prophylaxis: PPI Glucose control:  SSI No Central venous access:  Yes, and it is still needed Arterial line:  Yes, and it is still needed Foley:  N/A Mobility:  bed rest  PT consulted: N/A Last date of multidisciplinary goals of care discussion: Pending Palliative Care discussion for 6/2 Code Status:  full code Disposition: ICU   Critical care time: 33 minutes    Erick Colace ACNP-BC [place] Pager # [phone] OR # [phone] if no answer

## 2020-11-16 NOTE — Progress Notes (Signed)
  Echocardiogram 2D Echocardiogram has been performed.  Brandy Houston 11/16/2020, 4:09 PM

## 2020-11-16 NOTE — Consult Note (Signed)
Neurology Consultation Reason for Consult: Staring episode Referring Physician: Dr. Audria Nine  CC: Amputation site infection  History is obtained from: Chart review, patient's daughter at bedside  HPI: Brandy Houston is a 65 y.o. female with multiple medical comorbidities including hypertension, diabetes, coronary artery disease, end-stage renal disease on hemodialysis, recurrent GI bleeding with history of gastric and duodenal AVMs, peripheral vascular disease status post left AKA who presented on 10/18/2020 with amputation site infection.  Patient was seen by orthopedics and had a bony amputation revision on 12/01/2020.  Neurology was consulted today because patient has had episodes of sudden worsening of mental status, hypotension and apnea.  I spoke with patient daughter at bedside who reports that for the past 2 months patient has had episodes where she suddenly stares off, can last for few minutes followed by return to baseline but no memory of the episode.  She has these episodes every 4-6 hours, nonpositional, no clear triggers.  Initially this was attributed to her gabapentin use which has since been discontinued but episodes have persisted.  Daughter denies any relation to dialysis with these episodes, states they happen during dialysis after dialysis and on days between dialysis. Patient's daughter son also has history of epilepsy  ROS: Unable to obtain due to altered mental status.   Past Medical History:  Diagnosis Date  . Allergy   . Anemia   . Anxiety   . Arthritis    "in my joints" (03/10/2017)  . Chronic diastolic CHF (congestive heart failure) ([place])   . Depression    Chronic  . Diabetic peripheral neuropathy ([place])   . Dysrhythmia    tachycardia, normal ECHO 08-09-14  . ESRD (end stage renal disease) ([place])   . GERD (gastroesophageal reflux disease)   . Heart murmur     dx'd 02/05/2017  . Hepatitis C    "tx'd in 2016; I'm negative now" (02/05/2017)  . History of blood  transfusion 2017   "w/knee replacement"  . Hyperlipidemia   . Hypertension   . Peripheral neuropathy   . Protein calorie malnutrition ([place])   . Septic arthritis ([place])   . Slow transit constipation   . Symptomatic anemia 01/27/2020  . Type II diabetes mellitus ([name])    IDDM  . Uncontrolled hypertension 02/18/2013    Family History  Problem Relation Age of Onset  . Uterine cancer Mother   . Heart disease Father   . Hypertension Sister   . Uterine cancer Sister   . Cancer Brother        unsure type  . Diabetes Brother   . Hypertension Brother   . Colon cancer Neg Hx   . Colon polyps Neg Hx   . Esophageal cancer Neg Hx   . Rectal cancer Neg Hx   . Stomach cancer Neg Hx    Social History: Per chart review she has been smoking cigarettes. She has a 22.50 pack-year smoking history. She has never used smokeless tobacco. She reports previous alcohol use. She reports that she does not use drugs.   Medications Prior to Admission  Medication Sig Dispense Refill Last Dose  . atorvastatin (LIPITOR) 10 MG tablet Take 1 tablet (10 mg total) by mouth at bedtime.   11/12/2020 at pm  . calcium acetate (PHOSLO) 667 MG capsule Take 667-1,334 mg by mouth See admin instructions. Take 1,334 mg by mouth three times a day with meals and 667 mg twice a day with each snack   unk  . diphenhydrAMINE (BENADRYL) 25  mg capsule Take 25 mg by mouth every 6 (six) hours as needed for allergies.   unk  . diphenhydramine-acetaminophen (TYLENOL PM) 25-500 MG TABS tablet Take 1 tablet by mouth at bedtime.   unk  . doxycycline (VIBRAMYCIN) 100 MG capsule Take 1 capsule (100 mg total) by mouth 2 (two) times daily. 20 capsule 0 unk  . Ensure (ENSURE) Take 237 mLs by mouth daily with breakfast.   Past Week at Unknown time  . hydrALAZINE (APRESOLINE) 100 MG tablet Take 100 mg by mouth in the morning and at bedtime.   10/23/2020 at am  . isosorbide mononitrate (IMDUR) 30 MG 24 hr tablet Take 1 tablet (30 mg total) by mouth  daily. 90 tablet 3 11/12/2020 at am  . lidocaine-prilocaine (EMLA) cream Apply 1 application topically Every Tuesday,Thursday,and Saturday with dialysis.   11/10/2020 at Unknown time  . metoprolol succinate (TOPROL XL) 25 MG 24 hr tablet Take 1 tablet (25 mg total) by mouth daily. 30 tablet 11 11/06/2020 at 0900  . misoprostol (CYTOTEC) 100 MCG tablet TAKE 1 TABLET BY MOUTH EVERY 6 HOURS (Patient taking differently: Take 100 mcg by mouth every 6 (six) hours.) 120 tablet 0 11/02/2020 at Unknown time  . multivitamin (RENA-VIT) TABS tablet Take 1 tablet by mouth in the morning.   11/12/2020 at Unknown time  . nitroGLYCERIN (NITROSTAT) 0.4 MG SL tablet Place 1 tablet (0.4 mg total) under the tongue every 5 (five) minutes as needed. (Patient taking differently: Place 0.4 mg under the tongue every 5 (five) minutes as needed for chest pain.) 25 tablet 3 See note at Unknown time  . nortriptyline (PAMELOR) 10 MG capsule Take 3 capsules (30 mg total) by mouth at bedtime. 90 capsule 3 11/12/2020 at pm  . Accu-Chek Softclix Lancets lancets Check sugars three times a day for E11.65 100 each 12 N/A  . acetaminophen (TYLENOL) 500 MG tablet Take 1,000 mg by mouth every 6 (six) hours as needed for mild pain.   unk  . blood glucose meter kit and supplies KIT Dispense based on patient and insurance preference. Use up to four times daily as directed 1 each 0 N/A  . Blood Glucose Monitoring Suppl (ACCU-CHEK GUIDE) w/Device KIT 1 Device by Does not apply route daily. 1 kit 3 N/A  . diltiazem 2 % GEL Apply 1 application topically 3 (three) times daily. Apply to affected area on anus (Patient taking differently: Apply 1 application topically 3 (three) times daily as needed (for irritation on/in the anus).) 90 g 1 unk  . glucose blood (ACCU-CHEK GUIDE) test strip Check blood sugar up to 4 times a day 100 each 12 N/A  . HYDROcodone-acetaminophen (NORCO) 5-325 MG tablet Take 1 tablet by mouth every 6 (six) hours as needed for moderate  pain. 20 tablet 0 unk  . hydrocortisone (ANUSOL-HC) 25 MG suppository Place 1 suppository (25 mg total) rectally 2 (two) times daily. (Patient taking differently: Place 25 mg rectally 2 (two) times daily as needed for hemorrhoids or anal itching.) 12 suppository 0 unk  . Iron, Ferrous Sulfate, 325 (65 Fe) MG TABS Take 325 mg by mouth daily. Note that this medication will turn stool black and may cause constipation.   unk  . Lancet Devices (ACCU-CHEK SOFTCLIX) lancets Check sugars TID for E11.65 1 each 10 N/A  . pantoprazole (PROTONIX) 40 MG tablet Take 1 tablet (40 mg total) by mouth daily. 40 mg BID for 2 months & then once daily (Patient taking differently: Take 40 mg  by mouth See admin instructions. Take 40 mg by mouth two times a day for 2 months, then decrease to once a day) 90 tablet 1 unk  . polyethylene glycol (MIRALAX / GLYCOLAX) 17 g packet Take 17 g by mouth daily as needed for moderate constipation. (Patient taking differently: Take 17 g by mouth daily as needed for moderate constipation (MIX AND DRINK).)   unk  . UNABLE TO FIND Prosthetic socket, from [facility] 1 each 0 N/A      Exam: Current vital signs: BP 103/63   Pulse 87   Temp (!) 97.4 F (36.3 C) (Axillary)   Resp 17   Ht _0  (1.702 m)   Wt 58.9 kg   LMP 03/16/2002   SpO2 97%   BMI 20.34 kg/m  Vital signs in last 24 hours: Temp:  [92.3 F (33.5 C)-97.6 F (36.4 C)] 97.4 F (36.3 C) (06/03 1134) Pulse Rate:  [73-99] 87 (06/03 0500) Resp:  [8-80] 17 (06/03 1100) BP: (65-138)/(42-96) 103/63 (06/03 1015) SpO2:  [71 %-100 %] 97 % (06/03 0500) Arterial Line BP: (90-153)/(25-39) 153/39 (06/03 1100) Weight:  [58.9 kg-60.4 kg] 58.9 kg (06/03 0500)   Physical Exam  Constitutional: Appears well-developed, has temporal wasting Psych: Unable to assess due to altered mental status Eyes: No scleral injection HENT: No OP obstrucion Head: Normocephalic.  Cardiovascular: Normal rate and regular rhythm.  Respiratory:  Effort normal, non-labored breathing GI: Soft.  No distension. There is no tenderness.  Skin: Warm Neuro: Awake, alert, able to tell me her name but has significant trouble speaking, able to follow simple one-step commands, PERRLA, blinks to threat bilaterally, EOMI, no apparent facial asymmetry, antigravity strength in all 3 extremities except left AKA   I have reviewed labs in epic and the results pertinent to this consultation are: CBC:  Recent Labs  Lab 10/19/2020 1104 10/28/2020 1237 11/18/2020 2056 11/15/20 0427 11/15/20 1210 11/15/20 2334  WBC 7.9   < > 7.5 8.0  --   --   NEUTROABS 7.7  --   --   --   --   --   HGB 10.7*   < > 11.1* 10.8* 10.9* 8.8*  HCT 33.8*   < > 34.8* 33.7* 32.0* 26.0*  MCV 87.6   < > 86.8 87.1  --   --   PLT 143*   < > PLATELET CLUMPS NOTED ON SMEAR, UNABLE TO ESTIMATE 114*  --   --    < > = values in this interval not displayed.    Basic Metabolic Panel:  Lab Results  Component Value Date   NA 136 11/16/2020   K 6.4 (HH) 11/16/2020   CO2 11 (L) 11/16/2020   GLUCOSE 46 (L) 11/16/2020   BUN 39 (H) 11/16/2020   CREATININE 3.84 (H) 11/16/2020   CALCIUM 6.6 (L) 11/16/2020   GFRNONAA 12 (L) 11/16/2020   GFRAA 13 (L) 04/04/2020   Lipid Panel:  Lab Results  Component Value Date   LDLCALC 23 04/16/2020   HgbA1c:  Lab Results  Component Value Date   HGBA1C 4.8 06/21/2020   Urine Drug Screen:     Component Value Date/Time   LABOPIA NONE DETECTED 02/05/2017 1804   COCAINSCRNUR NONE DETECTED 02/05/2017 1804   LABBENZ NONE DETECTED 02/05/2017 1804   AMPHETMU NONE DETECTED 02/05/2017 1804   THCU NONE DETECTED 02/05/2017 1804   LABBARB NONE DETECTED 02/05/2017 1804    Alcohol Level No results found for: ETH   I have reviewed the images  obtained:  MRI Brain without contrast 09/23/2020: No acute intracranial abnormality. Findings of chronic microvascular ischemia.  ASSESSMENT/PLAN: 65 year old female with multiple medical comorbidities as noted in  HPI, episodes of transient alteration of awareness for the last 2 months.  Transient alteration of awareness -Differential for these episodes includes seizures versus medication use versus cardiogenic causes vs autonomic instability/hypertension  Recommendations: -We will obtain video EEG monitoring for characterization of spells -Will not start patient on any AEDs until we have a clear diagnosis -Continue seizure precautions -Management of rest of comorbidities per primary team  Thank you for allowing [place] to participate in the care of this patient. If you have any further questions, please contact  me or neurohospitalist.   I have spent a total of  90  minutes with the patient reviewing hospital notes,  test results, labs and examining the patient as well as establishing an assessment and plan that was discussed personally with the patient's daughter at bedside.  > 50% of time was spent in direct patient care.   Zeb Comfort Epilepsy [facility] neurohospitalist

## 2020-11-16 NOTE — Procedures (Signed)
Cortrak  Person Inserting Tube:  Esaw Dace, RD Tube Type:  Cortrak - 43 inches Tube Location:  Right nare Initial Placement:  Stomach Secured by: Bridle Technique Used to Measure Tube Placement:  Documented cm marking at nare/ corner of mouth Cortrak Secured At:  71 cm    Cortrak Tube Team Note:  Consult received to place a Cortrak feeding tube.   X-ray is required, abdominal x-ray has been ordered by the Cortrak team. Please confirm tube placement before using the Cortrak tube.   If the tube becomes dislodged please keep the tube and contact the Cortrak team at [email or web address] (password [email or web address]) for replacement.  If after hours and replacement cannot be delayed, place a NG tube and confirm placement with an abdominal x-ray.    Kerman Passey MS, RDN, LDN, CNSC Registered Dietitian III Clinical Nutrition RD Pager and On-Call Pager Number Located in [place]

## 2020-11-16 NOTE — Progress Notes (Signed)
Progress Update:  Called to bedside to evaluate patient for worsening hypotension, hypoxemic and encephalopathy. On exam she is breathing, has a weak cough and gag and is arousable to tactile stimulation. Does not follow commands. She is now on non-rebreather, but not in respiratory distress. CRRT running on heparinized circuit, unable to pull fluid off. On norepinephrine and vasopressin.   Called daughter nicole - discussed decline in her mother's status and she clarified that the patient is a full code, due to her brother's request, even though she (the patient and the daughter) did not want this. I discussed high likelihood for cardiac arrest tonight which I suspect is multifactorial but ultimately from renal failure.   Would not give additional fluids due to respiratory status. If hypotensive beyond two pressors would stop CRRT. Discussed with bedside nurse.   Lenice Llamas, MD Pulmonary and [place]

## 2020-11-17 ENCOUNTER — Encounter (HOSPITAL_COMMUNITY): Payer: Medicare Other

## 2020-11-17 LAB — GLUCOSE, CAPILLARY
Glucose-Capillary: 211 mg/dL — ABNORMAL HIGH (ref 70–99)
Glucose-Capillary: 220 mg/dL — ABNORMAL HIGH (ref 70–99)

## 2020-11-17 MED ORDER — VITAMIN K1 10 MG/ML IJ SOLN
5.0000 mg | Freq: Once | INTRAVENOUS | Status: AC
  Administered 2020-11-17: 5 mg via INTRAVENOUS
  Filled 2020-11-17: qty 0.5

## 2020-11-17 MED ORDER — SODIUM CHLORIDE 0.9% IV SOLUTION: Freq: Once | INTRAVENOUS | Status: AC

## 2020-11-18 LAB — TYPE AND SCREEN
ABO/RH(D): A POS
Antibody Screen: NEGATIVE
Unit division: 0
Unit division: 0
Unit division: 0
Unit division: 0

## 2020-11-18 LAB — PREPARE FRESH FROZEN PLASMA
Unit division: 0
Unit division: 0
Unit division: 0

## 2020-11-18 LAB — BPAM RBC
Blood Product Expiration Date: 202206242359
Blood Product Expiration Date: 202206242359
Blood Product Expiration Date: 202206242359
Blood Product Expiration Date: 202206242359
ISSUE DATE / TIME: 202206011524
ISSUE DATE / TIME: 202206011637
ISSUE DATE / TIME: 202206020728
Unit Type and Rh: 6200
Unit Type and Rh: 6200
Unit Type and Rh: 6200
Unit Type and Rh: 6200

## 2020-11-18 LAB — BPAM FFP
Blood Product Expiration Date: 202206062359
Blood Product Expiration Date: 202206062359
Blood Product Expiration Date: 202206062359
Blood Product Expiration Date: 202206062359
ISSUE DATE / TIME: 202206040352
ISSUE DATE / TIME: 202206040638
ISSUE DATE / TIME: 202206040638
ISSUE DATE / TIME: 202206041315
Unit Type and Rh: 600
Unit Type and Rh: 6200
Unit Type and Rh: 6200
Unit Type and Rh: 6200

## 2020-11-18 LAB — CULTURE, BLOOD (ROUTINE X 2)
Culture: NO GROWTH
Culture: NO GROWTH

## 2020-11-19 ENCOUNTER — Telehealth: Payer: Self-pay

## 2020-11-19 LAB — GLUCOSE, CAPILLARY
Glucose-Capillary: <10 mg/dL — CL (ref 70–99)
Glucose-Capillary: <10 mg/dL — CL (ref 70–99)
Glucose-Capillary: <10 mg/dL — CL (ref 70–99)

## 2020-11-19 NOTE — Telephone Encounter (Signed)
Aware to drop paperwork off.

## 2020-11-19 NOTE — Telephone Encounter (Signed)
Typically pt can get from her own pcp; If this is not possible, I will try to fill it out for her. Happy to help. Please give her my condolences

## 2020-11-19 NOTE — Telephone Encounter (Signed)
[phone]  Patient's daughter called in wanting to know if Dr. Jonni Sanger is willing to fill out FMLA paperwork from patient's hospital admit until her death date. Her company needs this so that she can start bereavement.

## 2020-11-20 LAB — VITAMIN C: Vitamin C: 0.1 mg/dL — ABNORMAL LOW (ref 0.4–2.0)

## 2020-11-20 LAB — ADAMTS13 ANTIBODY: ADAMTS13 Antibody: 2 Units/mL (ref <12)

## 2020-11-20 LAB — ADAMTS13 ACTIVITY: Adamts 13 Activity: 23.8 % — CL (ref >66.8)

## 2020-11-21 ENCOUNTER — Ambulatory Visit: Payer: Medicare Other | Admitting: Family Medicine

## 2020-11-21 LAB — VITAMIN A: Vitamin A (Retinoic Acid): 22.1 ug/dL (ref 22.0–69.5)

## 2020-11-22 ENCOUNTER — Telehealth: Payer: Self-pay

## 2020-11-22 NOTE — Telephone Encounter (Signed)
Patients son dropped off FMLA forms to be completed.   Brandy Houston has completed those forms.  She faxed 11/21/20.  I have notified son to pick up today.  I have gave copy to [facility] and originals are up front for pickup.

## 2020-11-23 ENCOUNTER — Ambulatory Visit: Payer: Medicare Other | Admitting: Cardiology

## 2020-11-23 NOTE — Telephone Encounter (Signed)
Please advise.. Do we still have his papers?

## 2020-11-23 NOTE — Telephone Encounter (Signed)
Patient called in stating that the FMLA needs to have matching dates, he is only wanting to be off until the 26th. Asked if we can fix this and re-fax it.

## 2020-11-26 NOTE — Telephone Encounter (Signed)
Son is aware that paperwork is ready to pick up for him to complete for the employee section.

## 2020-12-14 NOTE — Procedures (Signed)
Patient Name: Brandy Houston  MRN: [id number]  Epilepsy Attending: Lora Havens  Referring Physician/Provider: Dr Zeb Comfort Duration: 11/16/2020 1151 to 11/19/20 0600  Patient history:  65 year old female with multiple medical comorbidities as noted in HPI, episodes of transient alteration of awareness for the last 2 months. EEG to evaluate for seizure  Level of alertness: Awake, asleep  AEDs during EEG study: None  Technical aspects: This EEG study was done with scalp electrodes positioned according to the 10-20 International system of electrode placement. Electrical activity was acquired at a sampling rate of 500Hz  and reviewed with a high frequency filter of 70Hz  and a low frequency filter of 1Hz . EEG data were recorded continuously and digitally stored.   Description: The posterior dominant rhythm consists of 9-10 Hz activity of moderate voltage (25-35 uV) seen predominantly in posterior head regions, symmetric and reactive to eye opening and eye closing. Sleep was characterized by vertex waves, sleep spindles (12 to 14 Hz), maximal frontocentral region.  EEG showed intermittent generalized 3 to 6 Hz theta-delta slowing. After around 0500 on 2020/11/19, eeg showed intermittent generalized background attenuation which worsened to generalized eeg suppression. EEG was not reactive to tactile stimulation. Hyperventilation and photic stimulation were not performed.     ABNORMALITY - Intermittent slow, generalized - Background suppression, generalized  IMPRESSION: This study was initially suggestive of moderate diffuse encephalopathy, nonspecific etiology. Suddenly after around 0500 on 11/19/20, EEG worsened and showed profound diffuse encephalpathy. No seizures or epileptiform discharges were seen throughout the recording.  Brandy Houston [name]

## 2020-12-14 NOTE — Progress Notes (Signed)
Palliative-   Patient has died this morning.  Family at bedside.  Frustrated because they were made to leave at 8pm last night and when their brother arrived at 10pm last night and attempted to visit he was not allowed.  Emotional support given.   Mariana Kaufman, AGNP-C Palliative Medicine  No charge

## 2020-12-14 NOTE — Progress Notes (Addendum)
RN received orders for critical INR levels. Patient only has one access site- trialysis catheter that is running vaso, neo, bicarb, and D10, all of which are compatible with vitamin K. However, FFP will need its own separate line. MD notified of this. MD requested for IV team to attempt a PIV for FFP administration. IV team consult placed.   IV team able to get IV access in [place], RN flushed and verified blood return. Blood consent received over phone from patients son Kyrstyn Greear, 2nd RN Rosezella Rumpf) verified verbal consent for blood product. First FFP initiated.

## 2020-12-14 NOTE — Progress Notes (Signed)
[facility] Progress Note Patient Name: Brandy Houston DOB: 05-05-1956 MRN: [id number]   Date of Service  12-02-2020  HPI/Events of Note  INR = 7.8 - Draw on day shift, however, not resulted until tonight.   eICU Interventions  Plan: 1. Vitamin K+ 5 gm IV now. 2. FFP 4 units IV now. 2. Repeat INR at 9 AM.     Intervention Category Major Interventions: Other:  Lysle Dingwall 12-02-2020, 1:47 AM

## 2020-12-14 NOTE — Death Summary Note (Signed)
DEATH SUMMARY   Patient Details  Name: Brandy [place] MRN: [id number] DOB: 02/25/1956  Admission/Discharge Information   Admit Date:  12-04-2020  Date of Death: Date of Death: 12-08-2020  Time of Death: Time of Death: 0543  Length of Stay: 4  Referring Physician: [name], MD   Reason(s) for Hospitalization   Amputation site infection Diagnoses  Preliminary cause of death:  Secondary Diagnoses (including complications and co-morbidities):  Principal Problem:   Septic shock ([place]) Active Problems:   ESRD on hemodialysis ([place])   Essential hypertension   Acute respiratory failure with hypoxia ([place])   Severe protein-calorie malnutrition ([place])   Dyslipidemia   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  She is very ill-appearing at this time and difficult to understand with quiet speech and the Bair hugger in place.  She reports chronic difficulty with her left stump and with infection there as well as recent progressive dysphagia.  Her son reports that they have been taking antibiotics for the stump infection and it hasn't been getting better faster enough for him.  ED course: Initially planned for outpatient f/u but temp worsening in ER.  Has AKA, fell with wound and now infection despite  antibiotics and Silvadene dressing changes.  Dr. Sharol Given will see tomorrow.  Given Vanc/Rocephin.  O2 sats inaccurate due to hypothermia.  Also hypocalcemic and mildly hyperkalemic.  Call in to nephrology.  Ortho performed a aka revision and reportedly had clear margins. And she was maintained on abx.  Unfortunately, on 6/1 pt became hypotensive and ccm was consulted she was transferred to to the icu and closely monitored. On 6/3 pt had mental status deterioration and hypoxia and escalating pressors. She did not req intubation and her mental status marginally improved. Family was at bedside palliative consulted and decision was made over the course of the next few days to change pt's code status to DNR.   Pt failed to improve and echo revealed severe Biv heartfailure (likely resulting in her shock and lack of improvement) she had multiple valvular abnormalties and ef <20%. Cardiology was consulted to see if there was any management that could be offered and they were unable to intervene. Pt cont to req escalating pressors and she ultimately expired peacefully in the ICU.        Pertinent Labs and Studies  Significant Diagnostic Studies DG Chest 1 View  Result Date: 11/22/2020 CLINICAL DATA:  Insertion of tunneled catheter EXAM: CHEST  1 VIEW COMPARISON:  Portable exam 09/27/14 hours compared to 04-Dec-2020 FINDINGS: LEFT jugular line with tip projecting over SVC. Enlargement of cardiac silhouette. Perihilar infiltrates likely pulmonary edema. BILATERAL pleural effusions with significant atelectasis of mid to lower LEFT lung. No pneumothorax. Bones demineralized. IMPRESSION: No pneumothorax following LEFT jugular line insertion. BILATERAL pulmonary infiltrates/edema LEFT greater  than RIGHT pleural effusions. Electronically Signed   By: Lavonia Dana M.D.   On: 11/16/2020 21:23   DG Chest 1 View  Result Date: 11/08/2020 CLINICAL DATA:  Post fall.  Wound infection. EXAM: CHEST  1 VIEW COMPARISON:  Radiograph  09/22/2020 FINDINGS: Chronic cardiomegaly. Aortic atherosclerosis. Hilar prominence likely due to overlapping vascular structures. Small left pleural effusion and basilar opacity, slight increase from prior. Suspected trace right pleural effusion, unchanged. Similar interstitial thickening suspicious for pulmonary edema. No pneumothorax. No acute osseous abnormalities are seen. IMPRESSION: 1. Cardiomegaly with pulmonary edema, similar to exam last month. 2. Small left pleural effusion and basilar opacity, slight increase from prior. Trace right pleural effusion is unchanged. Electronically Signed   By: Keith Rake M.D.   On: 11/08/2020 15:35   DG Chest 2 View  Result Date: 10/18/2020 CLINICAL DATA:  Shortness of breath.  Difficulty swallowing. EXAM: CHEST - 2 VIEW COMPARISON:  11/08/2020 FINDINGS: Stable enlargement of the cardiac silhouette. Stable densities at left lung base are suggestive for left pleural fluid. Right lung remains clear. No overt pulmonary edema. Trachea is midline. Atherosclerotic calcifications at the aortic arch. IMPRESSION: 1. Persistent left basilar chest densities. Findings are compatible with combination with a small pleural effusion and atelectasis. Minimal change from the previous examination. 2. Stable enlargement of the cardiac silhouette. Electronically Signed   By: Markus Daft M.D.   On: 10/25/2020 12:00   DG Chest Port 1 View  Result Date: 11/16/2020 CLINICAL DATA:  Hypoxia EXAM: PORTABLE CHEST 1 VIEW COMPARISON:  Portable exam 1000 hours compared to 11/30/2020 FINDINGS: Tip of feeding tube projects over pyloric region. LEFT jugular dual-lumen central venous catheter with tip projecting over SVC. Normal heart size and mediastinal contours. Atherosclerotic calcification aorta. BILATERAL pleural effusions with atelectasis versus consolidation LEFT lower lobe. No pneumothorax. Bones demineralized. IMPRESSION: BILATERAL pleural effusions. Atelectasis versus consolidation LEFT  lower lobe. Aortic Atherosclerosis (ICD10-I70.0). Electronically Signed   By: Lavonia Dana M.D.   On: 11/16/2020 10:26   DG Knee Complete 4 Views Right  Result Date: 11/08/2020 CLINICAL DATA:  Fall with right knee pain. EXAM: RIGHT KNEE - COMPLETE 4+ VIEW COMPARISON:  None. FINDINGS: No fracture or dislocation. Medial tibiofemoral joint space narrowing. Mild tricompartmental peripheral spurring. There is subchondral cystic change in the patellofemoral compartment. Small quadriceps and patellar tendon enthesophytes. No joint effusion. Advanced vascular calcifications particularly for age. Mild medial soft tissue edema. IMPRESSION: 1. No acute fracture or dislocation. 2. 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 3. Advanced vascular calcifications, particularly for age. Electronically Signed   By: Keith Rake M.D.   On: 11/08/2020 15:32   DG Abd Portable 1V  Result Date: 11/16/2020 CLINICAL DATA:  Feeding tube placement EXAM: PORTABLE ABDOMEN - 1 VIEW COMPARISON:  Portable exam 1006 hours compared to 09/10/2020 FINDINGS: Tip of feeding tube projects over region of pylorus. Nonobstructive bowel gas pattern. LEFT lower lobe atelectasis versus consolidation. Bones demineralized. IMPRESSION: Tip of feeding tube projects over pylorus. Electronically Signed   By: Lavonia Dana M.D.   On: 11/16/2020 10:25   Overnight EEG with video  Result Date: 11-26-20 Lora Havens, MD     11/26/20  9:45 AM Patient Name: VON INSCOE MRN: [id number] Epilepsy Attending: Lora Havens Referring Physician/Provider: Dr Zeb Comfort Duration: 11/16/2020 1151 to Nov 26, 2020 0600 Patient history:  65 year old female with multiple medical comorbidities as noted in HPI, episodes of transient alteration of awareness for the last 2 months. EEG to evaluate  for seizure Level of alertness: Awake, asleep AEDs during EEG study: None Technical aspects: This EEG study was done with scalp electrodes  positioned according to the 10-20 International system of electrode placement. Electrical activity was acquired at a sampling rate of 500Hz  and reviewed with a high frequency filter of 70Hz  and a low frequency filter of 1Hz . EEG data were recorded continuously and digitally stored. Description: The posterior dominant rhythm consists of 9-10 Hz activity of moderate voltage (25-35 uV) seen predominantly in posterior head regions, symmetric and reactive to eye opening and eye closing. Sleep was characterized by vertex waves, sleep spindles (12 to 14 Hz), maximal frontocentral region.  EEG showed intermittent generalized 3 to 6 Hz theta-delta slowing. After around 0500 on 11-21-20, eeg showed intermittent generalized background attenuation which worsened to generalized eeg suppression. EEG was not reactive to tactile stimulation. Hyperventilation and photic stimulation were not performed.   ABNORMALITY - Intermittent slow, generalized - Background suppression, generalized IMPRESSION: This study was initially suggestive of moderate diffuse encephalopathy, nonspecific etiology. Suddenly after around 0500 on 2020-11-21, EEG worsened and showed profound diffuse encephalpathy. No seizures or epileptiform discharges were seen throughout the recording. Lora Havens   ECHOCARDIOGRAM COMPLETE  Result Date: 11/16/2020    ECHOCARDIOGRAM REPORT   Patient Name:   VELVIE THOMASTON Date of Exam: 11/16/2020 Medical Rec #:  [id number]     Height:       67.0 in Accession #:    [id number]    Weight:       129.9 lb Date of Birth:  [birth year]     BSA:          1.683 m Patient Age:    62 years      BP:           103/63 mmHg Patient Gender: F             HR:           74 bpm. Exam Location:  Inpatient Procedure: 2D Echo, Cardiac Doppler and Color Doppler Indications:    CHF - Acute Systolic  History:        Patient has prior history of Echocardiogram examinations, most                 recent 05/09/2020. CAD; Risk Factors:Diabetes and  Hypertension.                 PVD. ESRD.  Sonographer:    Clayton Lefort RDCS (AE) Referring Phys: [place]  1. There is severe biventricular failure with LVEF<20% and severely reduced RV systolic function.  2. Left ventricular ejection fraction, by estimation, is <20%. The left ventricle has severely decreased function. The left ventricle demonstrates global hypokinesis. The left ventricular internal cavity size was mildly dilated. There is mild concentric  left ventricular hypertrophy. Indeterminate diastolic filling due to E-A fusion.  3. Right ventricular systolic function is severely reduced. The right ventricular size is normal. There is severely elevated pulmonary artery systolic pressure. The estimated right ventricular systolic pressure is 50.0 mmHg.  4. Left atrial size was severely dilated.  5. Right atrial size was mildly dilated.  6. Moderate pericardial effusion. The pericardial effusion is posterior and lateral to the left ventricle measuring 1.5cm. There is no evidence of cardiac tamponade.  7. The mitral valve is degenerative. There is moderate thickening of the mitral valve leaflet(s). There is moderate calcification of the mitral valve leaflet(s). Severe mitral annular calcification. There is  moderate-to-severe mitral regurgitation with EROA 0.3, RVol 8mL. Jet appears mainly central due to lack of coaptation of the leaflets.  8. The tricuspid valve is degenerative. Tricuspid valve regurgitation is moderate to severe.  9. The aortic valve is tricuspid. There is moderate calcification of the aortic valve. There is moderate thickening of the aortic valve. Aortic valve regurgitation is moderate. Aortic regurgitation PHT measures 297 msec. Mild-to-moderate vs. moderate low flow, low gradient aortic valve stenosis. Aortic valve mean gradient measures 11.0 mmHg. Aortic valve peak gradient measures 24.0 mmHg. Aortic valve area, by VTI measures 1.26 cm. 10. The inferior vena cava is  normal in size with <50% respiratory variability, suggesting right atrial pressure of 8 mmHg. 11. Bilateral pleural effusions are present and there appears to be atelectasis of the left lung. Comparison(s): Compared to prior TTE in 04/2020, there is now severe BiV dysfunction (previous LVEF 40-45%). There is also now moderate-to-severe MR (previously trivial), moderate-to-severe TR (previously moderate), moderate AR (previously mild-to-moderate). The moderate pericardial effusion appears stable compared to prior with no tamponade physiology. FINDINGS  Left Ventricle: Left ventricular ejection fraction, by estimation, is <20%. The left ventricle has severely decreased function. The left ventricle demonstrates global hypokinesis. The left ventricular internal cavity size was mildly dilated. There is mild concentric left ventricular hypertrophy. Indeterminate diastolic filling due to E-A fusion. Right Ventricle: The right ventricular size is normal. No increase in right ventricular wall thickness. Right ventricular systolic function is severely reduced. There is severely elevated pulmonary artery systolic pressure. The tricuspid regurgitant velocity is 3.62 m/s, and with an assumed right atrial pressure of 8 mmHg, the estimated right ventricular systolic pressure is 09.7 mmHg. Left Atrium: Left atrial size was severely dilated. Right Atrium: Right atrial size was mildly dilated. Pericardium: A moderately sized pericardial effusion is present. The pericardial effusion is posterior and lateral to the left ventricle. There is no evidence of cardiac tamponade. Mitral Valve: There is at least moderate-to-severe mitral regurgitation with EROA 0.3, RVol38mL. Jet appears mainly central due to lack of coaptation of the leaflets. The mitral valve is degenerative in appearance. There is moderate thickening of the mitral valve leaflet(s). There is moderate calcification of the mitral valve leaflet(s). Severe mitral annular  calcification. Moderate to severe mitral valve regurgitation. Tricuspid Valve: The tricuspid valve is degenerative in appearance. Tricuspid valve regurgitation is moderate to severe. Aortic Valve: The aortic valve is tricuspid. There is moderate calcification of the aortic valve. There is moderate thickening of the aortic valve. Aortic valve regurgitation is moderate. Aortic regurgitation PHT measures 297 msec. Mild to moderate vs moderate low flow, low gradient aortic stenosis is present. Aortic valve mean gradient measures 11.0 mmHg. Aortic valve peak gradient measures 24.0 mmHg. Aortic valve area, by VTI measures 1.26 cm. Pulmonic Valve: The pulmonic valve was normal in structure. Pulmonic valve regurgitation is mild. Aorta: The aortic root is normal in size and structure. Venous: The inferior vena cava is normal in size with less than 50% respiratory variability, suggesting right atrial pressure of 8 mmHg. IAS/Shunts: There is right bowing of the interatrial septum, suggestive of elevated left atrial pressure. No atrial level shunt detected by color flow Doppler. Additional Comments: There is pleural effusion in both left and right lateral regions. The left lung appears to be atelectatic.  LEFT VENTRICLE PLAX 2D LVIDd:         5.00 cm LV PW:         1.30 cm LV IVS:  1.10 cm LVOT diam:     2.30 cm LV SV:         56 LV SV Index:   33 LVOT Area:     4.15 cm  RIGHT VENTRICLE            IVC RV Basal diam:  3.70 cm    IVC diam: 1.00 cm RV Mid diam:    3.20 cm RV S prime:     5.77 cm/s TAPSE (M-mode): 1.0 cm LEFT ATRIUM              Index       RIGHT ATRIUM           Index LA diam:        4.80 cm  2.85 cm/m  RA Area:     18.60 cm LA Vol (A2C):   132.0 ml 78.43 ml/m RA Volume:   55.30 ml  32.86 ml/m LA Vol (A4C):   90.4 ml  53.71 ml/m LA Biplane Vol: 110.0 ml 65.36 ml/m  AORTIC VALVE AV Area (Vmax):    1.11 cm AV Area (Vmean):   1.02 cm AV Area (VTI):     1.26 cm AV Vmax:           245.00 cm/s AV Vmean:           148.000 cm/s AV VTI:            0.444 m AV Peak Grad:      24.0 mmHg AV Mean Grad:      11.0 mmHg LVOT Vmax:         65.30 cm/s LVOT Vmean:        36.500 cm/s LVOT VTI:          0.135 m LVOT/AV VTI ratio: 0.30 AI PHT:            297 msec  AORTA Ao Root diam: 2.80 cm MR Peak grad:    106.1 mmHg  TRICUSPID VALVE MR Mean grad:    64.0 mmHg   TR Peak grad:   52.4 mmHg MR Vmax:         515.00 cm/s TR Vmax:        362.00 cm/s MR Vmean:        374.0 cm/s MR PISA:         3.08 cm    SHUNTS MR PISA Eff ROA: 51 mm      Systemic VTI:  0.14 m MR PISA Radius:  0.70 cm     Systemic Diam: 2.30 cm Gwyndolyn Kaufman MD Electronically signed by Gwyndolyn Kaufman MD Signature Date/Time: 11/16/2020/4:52:13 PM    Final    DG Femur 1 View Left  Result Date: 11/08/2020 CLINICAL DATA:  Fall with left femur pain. EXAM: LEFT FEMUR 1 VIEW COMPARISON:  None. FINDINGS: Remote mid femoral amputation. Mild heterotopic calcification at the amputation site. The resection margin is smooth. No erosion or bony destruction. No evidence of fracture. Femoral head is well seated. Pubic rami are intact. Advanced vascular calcifications. Mild skin irregularity at the distal stump. IMPRESSION: 1. No acute fracture of the left femur. Remote mid femoral amputation. 2. Mild skin irregularity at the distal stump. Electronically Signed   By: Keith Rake M.D.   On: 11/08/2020 15:32   DG Femur Min 2 Views Left  Result Date: 10/22/2020 CLINICAL DATA:  Amputation. EXAM: LEFT FEMUR 2 VIEWS COMPARISON:  11/08/2020. FINDINGS: Prior AKA. Mild skin irregularity about the distal stump again noted. No acute  or focal bony abnormality. Peripheral vascular calcification. Surgical clips in the pelvis. Calcified density in pelvis most likely calcified fibroid. IMPRESSION: 1. Prior AKA. Soft tissue irregularity is again noted about distal stump. No acute bony or focal abnormality. 2.  Peripheral vascular disease. Electronically Signed   By: Marcello Moores  Register    On: 10/14/2020 12:01    Microbiology Recent Results (from the past 240 hour(s))  Culture, blood (routine x 2)     Status: None   Collection Time: 11/02/2020 12:30 PM   Specimen: BLOOD RIGHT HAND  Result Value Ref Range Status   Specimen Description BLOOD RIGHT HAND  Final   Special Requests   Final    BOTTLES DRAWN AEROBIC ONLY Blood Culture results may not be optimal due to an inadequate volume of blood received in culture bottles   Culture   Final    NO GROWTH 5 DAYS Performed at [facility], [place] [address] [place], [place] [address]    Report Status 11/18/2020 FINAL  Final  Culture, blood (routine x 2)     Status: None   Collection Time: 10/16/2020  1:00 PM   Specimen: BLOOD LEFT FOREARM  Result Value Ref Range Status   Specimen Description BLOOD LEFT FOREARM  Final   Special Requests   Final    BOTTLES DRAWN AEROBIC AND ANAEROBIC Blood Culture results may not be optimal due to an inadequate volume of blood received in culture bottles   Culture   Final    NO GROWTH 5 DAYS Performed at [facility], [place] [address] [place],  [address]    Report Status 11/18/2020 FINAL  Final  Resp Panel by RT-PCR (Flu A&B, Covid) Nasopharyngeal Swab     Status: None   Collection Time: 10/29/2020  2:00 PM   Specimen: Nasopharyngeal Swab; Nasopharyngeal(NP) swabs in vial transport medium  Result Value Ref Range Status   SARS Coronavirus 2 by RT PCR NEGATIVE NEGATIVE Final    Comment: (NOTE) SARS-CoV-2 target nucleic acids are NOT DETECTED.  The SARS-CoV-2 RNA is generally detectable in upper respiratory specimens during the acute phase of infection. The lowest concentration of SARS-CoV-2 viral copies this assay can detect is 138 copies/mL. A negative result does not preclude SARS-Cov-2 infection and should not be used as the sole basis for treatment or other patient management decisions. A negative result may occur with  improper specimen collection/handling, submission of  specimen other than nasopharyngeal swab, presence of viral mutation(s) within the areas targeted by this assay, and inadequate number of viral copies(<138 copies/mL). A negative result must be combined with clinical observations, patient history, and epidemiological information. The expected result is Negative.  Fact Sheet for Patients:  EntrepreneurPulse.com.au  Fact Sheet for Healthcare Providers:  IncredibleEmployment.be  This test is no t yet approved or cleared by the [place] FDA and  has been authorized for detection and/or diagnosis of SARS-CoV-2 by FDA under an Emergency Use Authorization (EUA). This EUA will remain  in effect (meaning this test can be used) for the duration of the COVID-19 declaration under Section 564(b)(1) of the Act, 21 U.S.C.section 360bbb-3(b)(1), unless the authorization is terminated  or revoked sooner.       Influenza A by PCR NEGATIVE NEGATIVE Final   Influenza B by PCR NEGATIVE NEGATIVE Final    Comment: (NOTE) The Xpert Xpress SARS-CoV-2/FLU/RSV plus assay is intended as an aid in the diagnosis of influenza from Nasopharyngeal swab specimens and should not be used as a  sole basis for treatment. Nasal washings and aspirates are unacceptable for Xpert Xpress SARS-CoV-2/FLU/RSV testing.  Fact Sheet for Patients: EntrepreneurPulse.com.au  Fact Sheet for Healthcare Providers: IncredibleEmployment.be  This test is not yet approved or cleared by the [place] FDA and has been authorized for detection and/or diagnosis of SARS-CoV-2 by FDA under an Emergency Use Authorization (EUA). This EUA will remain in effect (meaning this test can be used) for the duration of the COVID-19 declaration under Section 564(b)(1) of the Act, 21 U.S.C. section 360bbb-3(b)(1), unless the authorization is terminated or revoked.  Performed at [facility], [place] [address],  [place], [place] [address]   MRSA PCR Screening     Status: None   Collection Time: 11/15/20  1:03 PM   Specimen: Nasopharyngeal  Result Value Ref Range Status   MRSA by PCR NEGATIVE NEGATIVE Final    Comment:        The GeneXpert MRSA Assay (FDA approved for NASAL specimens only), is one component of a comprehensive MRSA colonization surveillance program. It is not intended to diagnose MRSA infection nor to guide or monitor treatment for MRSA infections. Performed at [facility], [place] [address] [place], [place] [address]     Lab Basic Metabolic Panel: Recent Labs  Lab 11/15/20 [phone] 11/15/20 1210 11/15/20 1543 11/15/20 2334 11/16/20 0914 11/16/20 1116 11/16/20 1629  NA 130*   < > 132* 133* 136 132* 133*  K 5.8*   < > 5.5* 4.7 6.4* 4.7 4.5  CL 94*  --  96*  --  103 105 101  CO2 21*  --  20*  --  11* 14* 17*  GLUCOSE 118*  --  108*  --  46* 215* 212*  BUN 60*  --  57*  --  39* 25* 22  CREATININE 5.92*  --  5.66*  --  3.84* 2.70* 2.35*  CALCIUM 6.5*  --  6.5*  --  6.6* 6.2* 6.0*  MG  --   --   --   --  2.2  --   --   PHOS  --   --  8.5*  --  7.7* 5.1* 4.2   < > = values in this interval not displayed.   Liver Function Tests: Recent Labs  Lab 11/29/2020 2056 11/15/20 1543 11/16/20 0914 11/16/20 1116 11/16/20 1629  AST 43*  --   --   --  1,544*  ALT 49*  --   --   --  498*  ALKPHOS 216*  --   --   --  295*  BILITOT 0.9  --   --   --  1.9*  PROT 3.9*  --   --   --  <3.0*  ALBUMIN 1.2* 1.3* 1.1* <1.0* <1.0*   No results for input(s): LIPASE, AMYLASE in the last 168 hours. Recent Labs  Lab 11/16/20 1627  AMMONIA 49*   CBC: Recent Labs  Lab 11/02/2020 1104 10/31/2020 1237 12/07/2020 0217 12/13/2020 2056 11/15/20 0427 11/15/20 1210 11/15/20 2334 11/16/20 1126 11/16/20 1345  WBC 7.9  --  7.1 7.5 8.0  --   --  9.0  --   NEUTROABS 7.7  --   --   --   --   --   --   --   --   HGB 10.7*   < > 9.2* 11.1* 10.8* 10.9* 8.8* 7.8*  --   HCT 33.8*   < > 28.1* 34.8*  33.7* 32.0* 26.0* 24.7*  --  MCV 87.6  --  84.4 86.8 87.1  --   --  89.2  --   PLT 143*  --  116* PLATELET CLUMPS NOTED ON SMEAR, UNABLE TO ESTIMATE 114*  --   --  18* 16*   < > = values in this interval not displayed.   Cardiac Enzymes: No results for input(s): CKTOTAL, CKMB, CKMBINDEX, TROPONINI in the last 168 hours. Sepsis Labs: Recent Labs  Lab 10/21/2020 1700 10/24/2020 2035 11/23/2020 0217 12/04/2020 2056 11/15/20 0427 11/15/20 2102 11/16/20 1126 11/16/20 1307  PROCALCITON 6.11  --   --   --   --   --   --   --   WBC  --   --  7.1 7.5 8.0  --  9.0  --   LATICACIDVEN  --  2.0*  --  4.0*  --  5.7*  --  7.7*    Procedures/Operations  Revision of AKA See epic   Audria Nine 11/18/2020, 5:42 PM

## 2020-12-14 NOTE — Progress Notes (Signed)
Situation: Chaplain Medinas-Lockley responding to page from nurse regarding death of pt Brandy Houston.  Background: Facts: RN shared that [name] had passed earlier this morning and that family had now arrived. Family: [name] daughter, [name], was present. Her younger brother, Roderic Palau, arrived toward the end of this chaplain's visit. Feelings: Ms. [name] was tearful, intermittently sharing stories about she and her mother's relationship, stating "she's my best friend; I already miss her so much." Ms. [name] expressed feeling both "shocked" but also "knowing she's in a better place." She also expressed some feelings of guilt for "not being here when she died." Faith: Ms. [name] shared that her mother was a "woman of faith" and that she also "was afraid to die alone." Ms. [name] requested prayer at this time.  Actions & Assessments: Chaplain offered compassionate presence, space to process acute grief, as well as prayer. Family seems to be both grieving while accepting of the situation, going between feelings of guilt, sadness, and thankfulness (both of their mother's love and that she is "no longer suffering"). Family shared "we're going to be alright."  Recommendations: Continue to offer grief support as needed. Chaplain remains available for follow-up spiritual/emotional support as needed.  Rev. Susanne Borders, MDiv      2020-12-11 1000  Clinical Encounter Type  Visited With Family  Visit Type Death  Referral From Nurse  Spiritual Encounters  Spiritual Needs Grief support;Prayer

## 2020-12-14 NOTE — Progress Notes (Signed)
eLink Physician-Brief Progress Note Patient Name: Brandy Houston DOB: [birth year] MRN: [id number]   Date of Service  Nov 25, 2020  HPI/Events of Note  Hypotension - BP = 78/33. Nurse reports no response to restarting Norepinephrine IV infusion at 100 mcg/min. Patient is DNR with no further escalation. Family notified and on way to hospital. Expect patient to pass tonight.   eICU Interventions  Continue present management.      Intervention Category Major Interventions: Hypotension - evaluation and management  Brandy Houston 2020-11-25, 5:27 AM

## 2020-12-14 DEATH — deceased

## 2021-04-03 ENCOUNTER — Ambulatory Visit: Payer: Medicare Other | Admitting: Neurology
